# Patient Record
Sex: Male | Born: 1970
Health system: Southern US, Community
[De-identification: ages and names within clinical notes are randomized; demographics above are authoritative.]

## PROBLEM LIST (undated history)

## (undated) ENCOUNTER — Emergency Department: Payer: Medicare HMO | Source: Home / Self Care

## (undated) DIAGNOSIS — I10 Essential (primary) hypertension: Secondary | ICD-10-CM

## (undated) DIAGNOSIS — I5030 Unspecified diastolic (congestive) heart failure: Secondary | ICD-10-CM

## (undated) DIAGNOSIS — Z720 Tobacco use: Secondary | ICD-10-CM

## (undated) DIAGNOSIS — F418 Other specified anxiety disorders: Secondary | ICD-10-CM

## (undated) DIAGNOSIS — G8929 Other chronic pain: Secondary | ICD-10-CM

## (undated) DIAGNOSIS — F1410 Cocaine abuse, uncomplicated: Secondary | ICD-10-CM

## (undated) DIAGNOSIS — F1210 Cannabis abuse, uncomplicated: Secondary | ICD-10-CM

## (undated) DIAGNOSIS — I4901 Ventricular fibrillation: Secondary | ICD-10-CM

## (undated) DIAGNOSIS — E785 Hyperlipidemia, unspecified: Secondary | ICD-10-CM

## (undated) DIAGNOSIS — E66811 Obesity, class 1: Secondary | ICD-10-CM

## (undated) DIAGNOSIS — F1190 Opioid use, unspecified, uncomplicated: Secondary | ICD-10-CM

## (undated) DIAGNOSIS — K7030 Alcoholic cirrhosis of liver without ascites: Secondary | ICD-10-CM

## (undated) DIAGNOSIS — I2510 Atherosclerotic heart disease of native coronary artery without angina pectoris: Secondary | ICD-10-CM

## (undated) DIAGNOSIS — I219 Acute myocardial infarction, unspecified: Secondary | ICD-10-CM

## (undated) DIAGNOSIS — F1010 Alcohol abuse, uncomplicated: Secondary | ICD-10-CM

## (undated) DIAGNOSIS — J449 Chronic obstructive pulmonary disease, unspecified: Secondary | ICD-10-CM

## (undated) DIAGNOSIS — K219 Gastro-esophageal reflux disease without esophagitis: Secondary | ICD-10-CM

## (undated) DIAGNOSIS — E271 Primary adrenocortical insufficiency: Secondary | ICD-10-CM

## (undated) DIAGNOSIS — I255 Ischemic cardiomyopathy: Secondary | ICD-10-CM

## (undated) DIAGNOSIS — Z91199 Patient's noncompliance with other medical treatment and regimen due to unspecified reason: Secondary | ICD-10-CM

## (undated) DIAGNOSIS — K922 Gastrointestinal hemorrhage, unspecified: Secondary | ICD-10-CM

## (undated) DIAGNOSIS — K649 Unspecified hemorrhoids: Secondary | ICD-10-CM

## (undated) DIAGNOSIS — K766 Portal hypertension: Secondary | ICD-10-CM

## (undated) DIAGNOSIS — G43909 Migraine, unspecified, not intractable, without status migrainosus: Secondary | ICD-10-CM

## (undated) HISTORY — DX: Ischemic cardiomyopathy: I25.5

## (undated) HISTORY — PX: CORONARY ANGIOPLASTY WITH STENT PLACEMENT: SHX49

## (undated) HISTORY — DX: Atherosclerotic heart disease of native coronary artery without angina pectoris: I25.10

## (undated) HISTORY — DX: Tobacco use: Z72.0

## (undated) HISTORY — DX: Ventricular fibrillation: I49.01

## (undated) HISTORY — DX: Essential (primary) hypertension: I10

## (undated) HISTORY — DX: Other chronic pain: G89.29

## (undated) HISTORY — DX: Gastro-esophageal reflux disease without esophagitis: K21.9

## (undated) HISTORY — DX: Other specified anxiety disorders: F41.8

## (undated) HISTORY — DX: Hyperlipidemia, unspecified: E78.5

## (undated) HISTORY — DX: Migraine, unspecified, not intractable, without status migrainosus: G43.909

---

## 1999-01-19 ENCOUNTER — Encounter: Payer: Self-pay | Admitting: Emergency Medicine

## 1999-01-19 ENCOUNTER — Emergency Department (HOSPITAL_COMMUNITY): Admission: EM | Admit: 1999-01-19 | Discharge: 1999-01-19 | Payer: Self-pay | Admitting: Emergency Medicine

## 2001-06-21 ENCOUNTER — Inpatient Hospital Stay (HOSPITAL_COMMUNITY): Admission: EM | Admit: 2001-06-21 | Discharge: 2001-06-23 | Payer: Self-pay | Admitting: Emergency Medicine

## 2005-04-09 ENCOUNTER — Emergency Department: Payer: Self-pay | Admitting: Emergency Medicine

## 2005-04-24 ENCOUNTER — Emergency Department: Payer: Self-pay | Admitting: Emergency Medicine

## 2005-04-24 ENCOUNTER — Other Ambulatory Visit: Payer: Self-pay

## 2005-05-05 ENCOUNTER — Emergency Department: Payer: Self-pay | Admitting: Internal Medicine

## 2005-11-01 ENCOUNTER — Emergency Department: Payer: Self-pay | Admitting: Emergency Medicine

## 2005-12-02 ENCOUNTER — Other Ambulatory Visit: Payer: Self-pay

## 2005-12-02 ENCOUNTER — Emergency Department: Payer: Self-pay | Admitting: General Practice

## 2005-12-02 IMAGING — CR DG CHEST 2V
1 series · 2 of 2 positions shown · non-contrast
Comparison: none

REASON FOR EXAM: Chest pain and shortness of breath.  [HOSPITAL]
COMMENTS:

PROCEDURE:     DXR - DXR CHEST PA (OR AP) AND LATERAL  - [DATE]  [DATE]
RESULT:       The patient has no prior studies for comparison.  The lungs
are clear.  The heart and pulmonary vessels are normal.  The bony and
mediastinal structures are unremarkable.

[Series 2842: postero_anterior · 0.11mm/px · 2 of 2 slices shown]
[im 1/2]
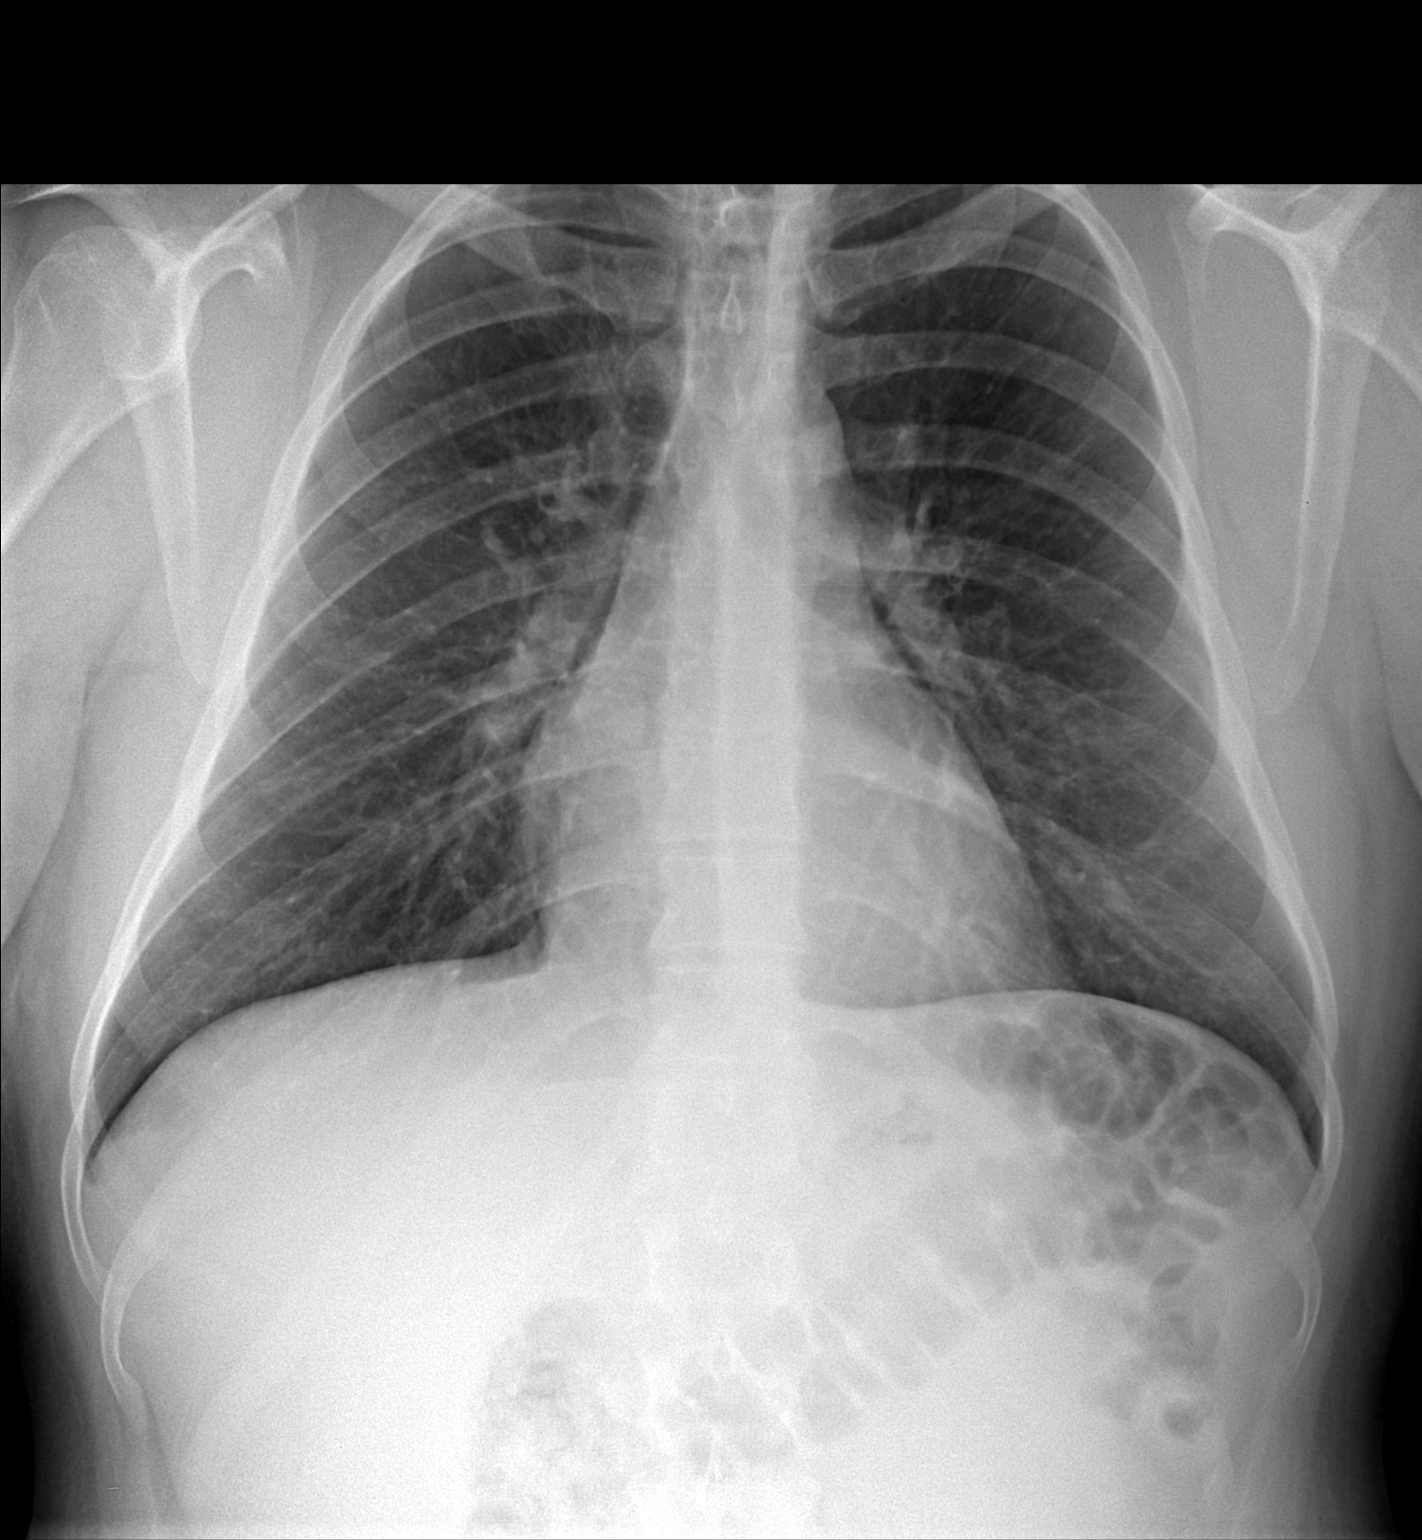
[im 2/2]
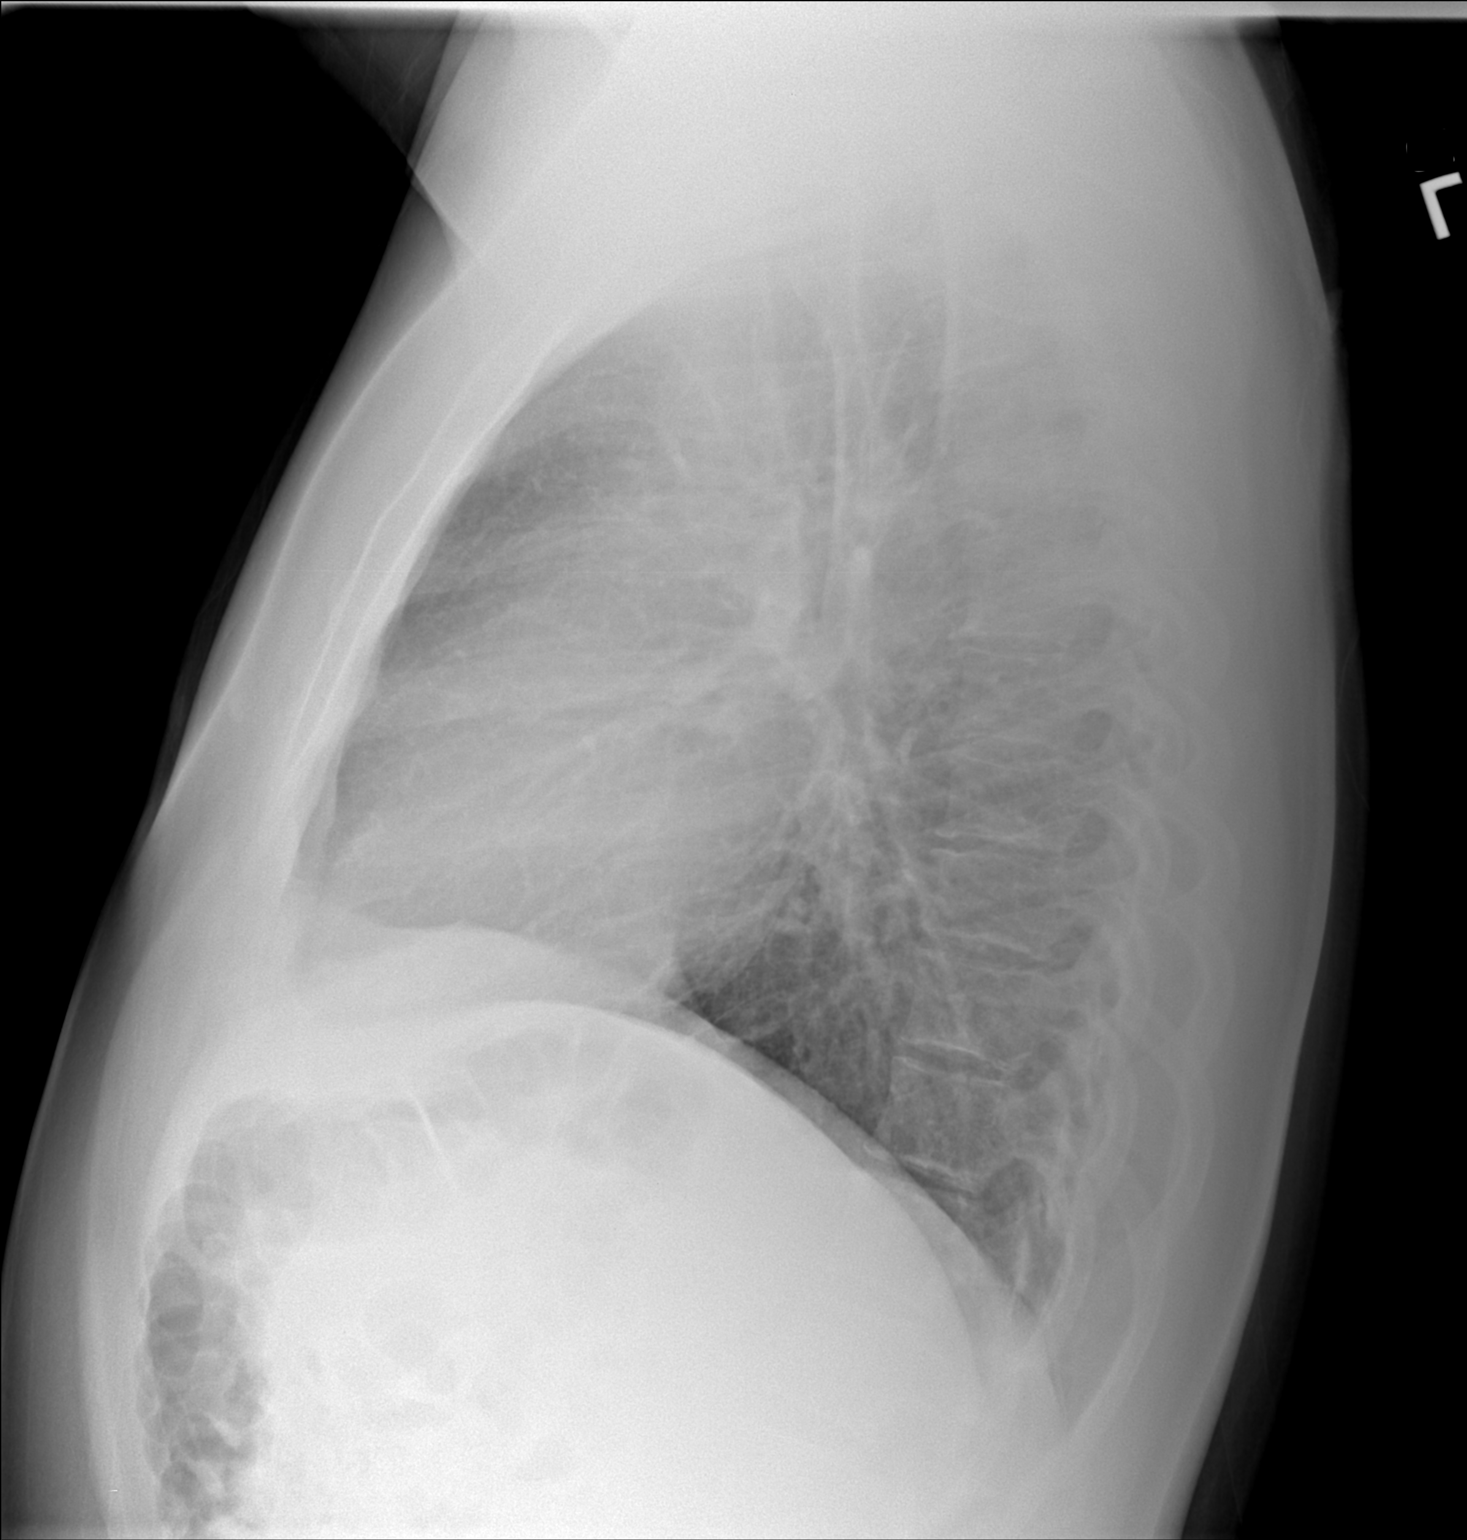

[2 of 2 positions shown; findings below may reference images not displayed]

IMPRESSION: No acute cardiopulmonary disease evident.

## 2006-08-26 ENCOUNTER — Emergency Department: Payer: Self-pay | Admitting: Emergency Medicine

## 2007-02-17 ENCOUNTER — Emergency Department: Payer: Self-pay | Admitting: Emergency Medicine

## 2007-06-22 ENCOUNTER — Emergency Department: Payer: Self-pay | Admitting: Internal Medicine

## 2007-08-18 ENCOUNTER — Emergency Department: Payer: Self-pay | Admitting: Emergency Medicine

## 2008-07-06 ENCOUNTER — Emergency Department: Payer: Self-pay | Admitting: Emergency Medicine

## 2008-08-23 ENCOUNTER — Emergency Department: Payer: Self-pay | Admitting: Emergency Medicine

## 2008-11-18 ENCOUNTER — Emergency Department: Payer: Self-pay | Admitting: Emergency Medicine

## 2009-11-10 ENCOUNTER — Emergency Department: Payer: Self-pay | Admitting: Emergency Medicine

## 2010-05-01 ENCOUNTER — Emergency Department: Payer: Self-pay | Admitting: Unknown Physician Specialty

## 2010-05-01 IMAGING — CR DG SHOULDER 3+V*R*
1 series · 3 of 3 positions shown · non-contrast
Comparison: none

REASON FOR EXAM: pain/decrease ROM for one week
COMMENTS:

PROCEDURE:     DXR - DXR SHOULDER RIGHT COMPLETE  - [DATE]  [DATE]
RESULT:     No fracture, dislocation or other acute bony abnormality is
identified.

[Series 1: view not recorded · 0.17mm/px · 3 of 3 slices shown]
[im 1/3]
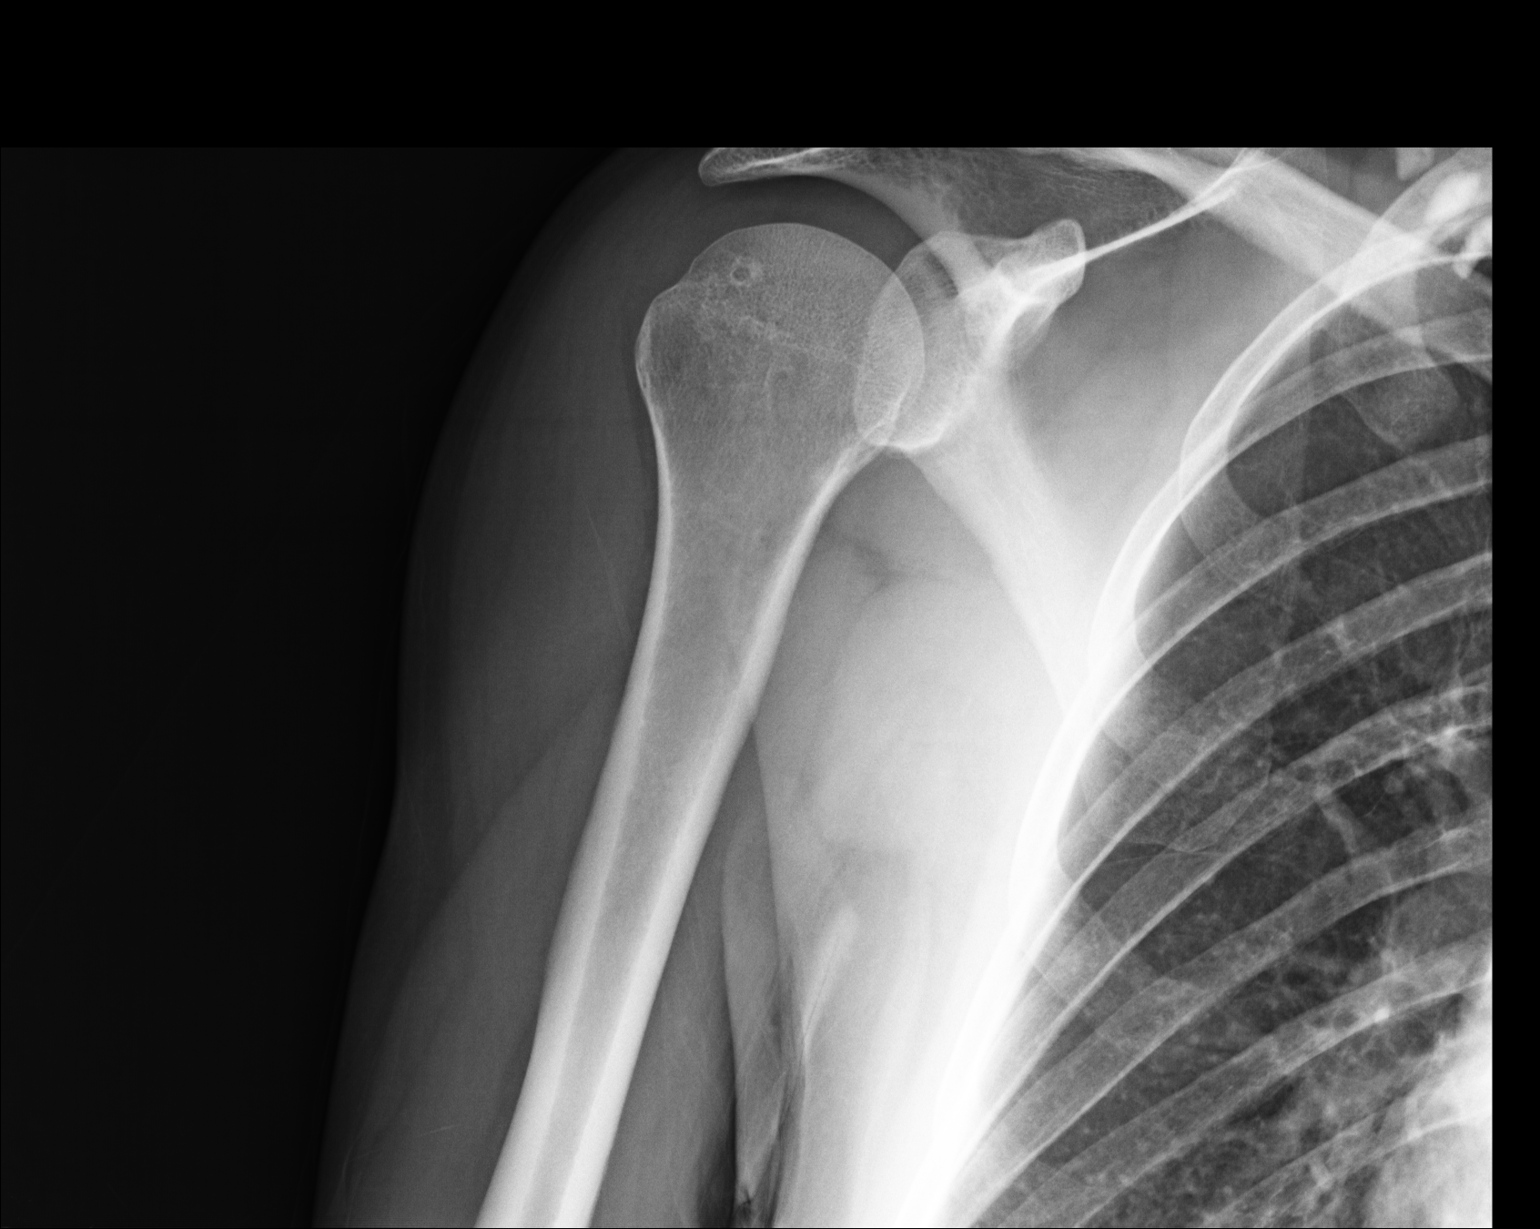
[im 2/3]
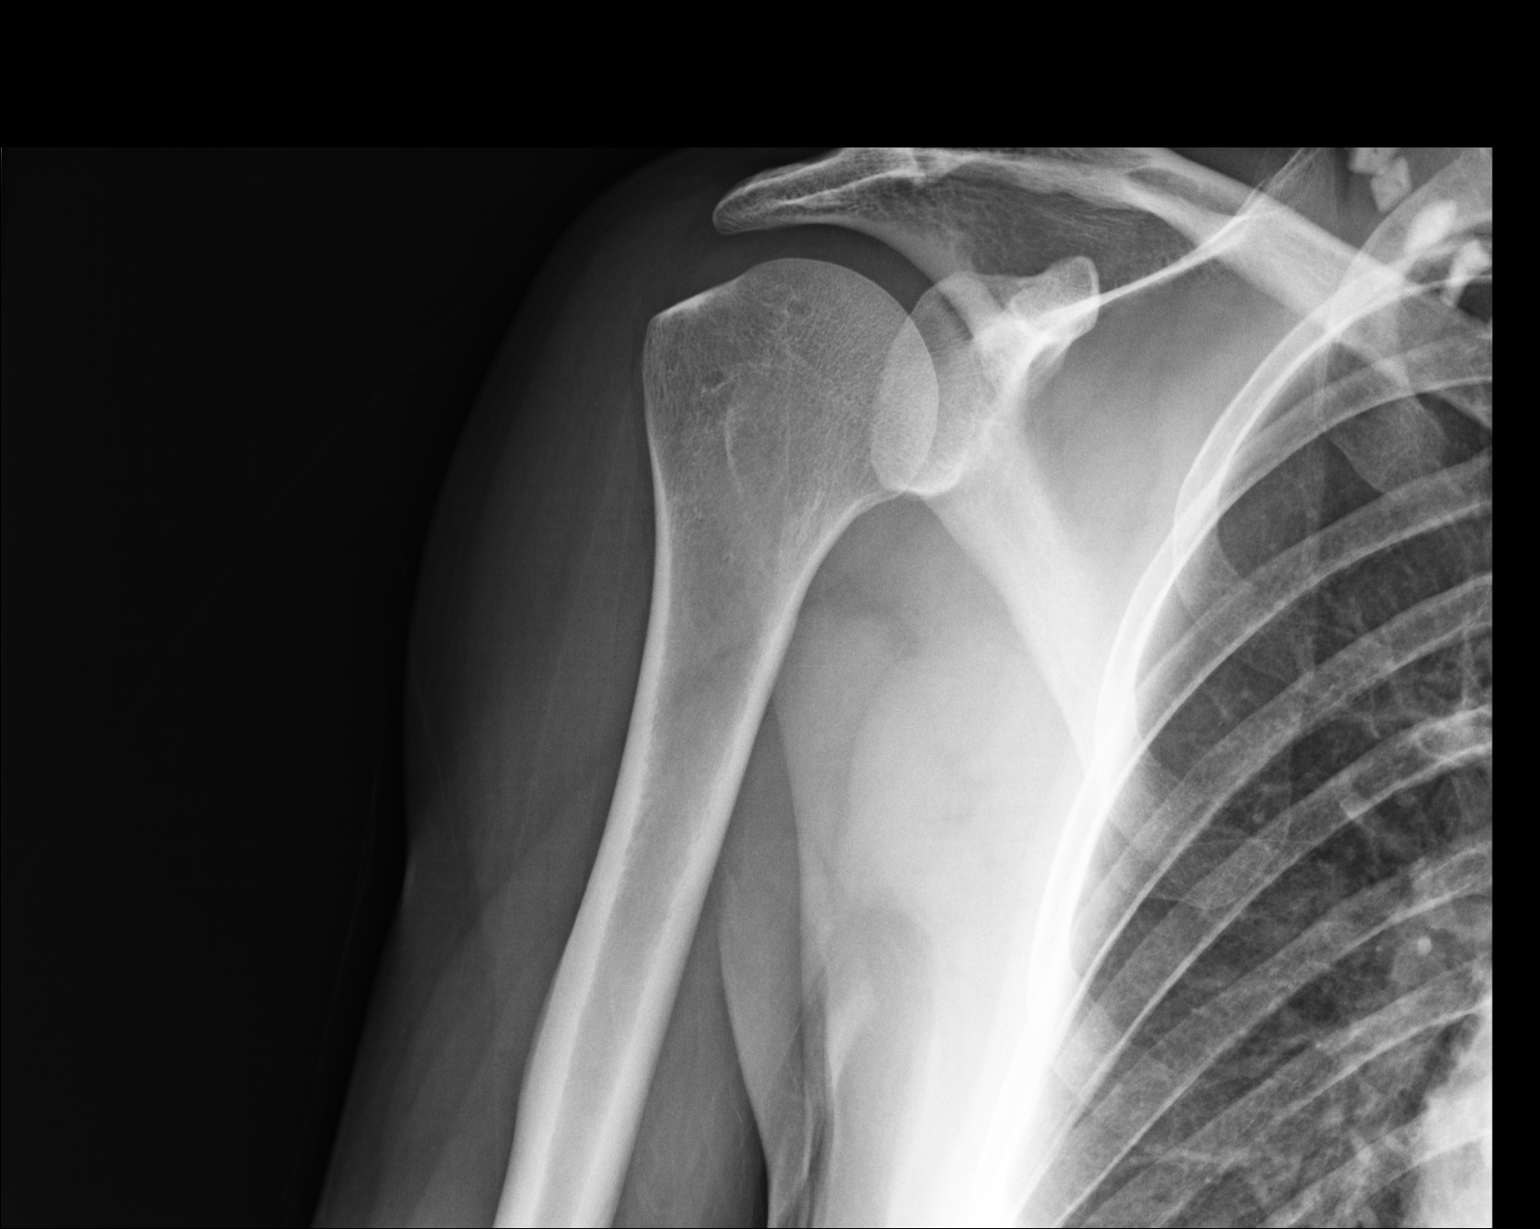
[im 3/3]
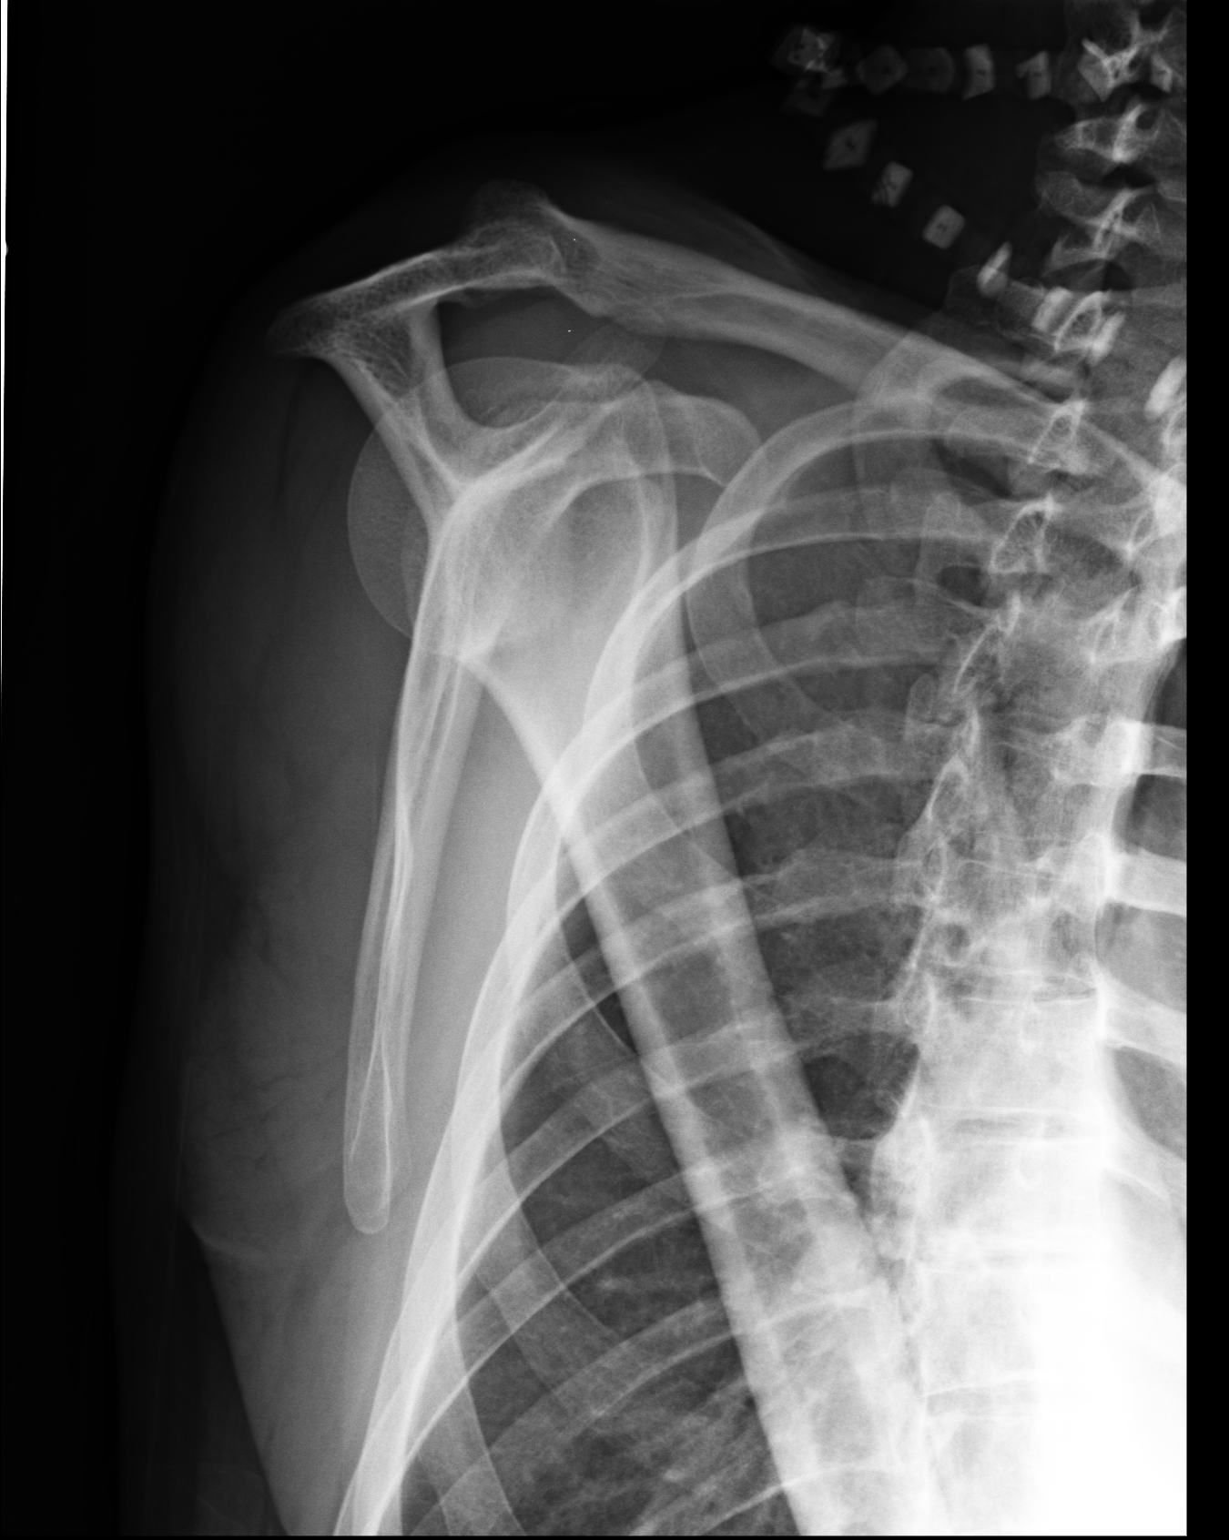

[3 of 3 positions shown; findings below may reference images not displayed]

IMPRESSION: 1. No significant osseous abnormalities are noted.
2. No soft tissue calcification about the humeral head is seen.

## 2010-08-13 ENCOUNTER — Emergency Department: Payer: Self-pay | Admitting: Emergency Medicine

## 2010-09-02 ENCOUNTER — Emergency Department: Payer: Self-pay | Admitting: Emergency Medicine

## 2010-09-02 IMAGING — CR DG HAND COMPLETE 3+V*L*
1 series · 3 of 3 positions shown · non-contrast
Comparison: none

REASON FOR EXAM: edema w/suspected FB
COMMENTS:   May transport without cardiac monitor

PROCEDURE:     DXR - DXR HAND LT COMPLETE  W/OBLIQUES  - [DATE]  [DATE]
RESULT:     No fracture, dislocation or other acute bony abnormality is
identified.

[Series 1: view not recorded · 0.17mm/px · 3 of 3 slices shown]
[im 1/3]
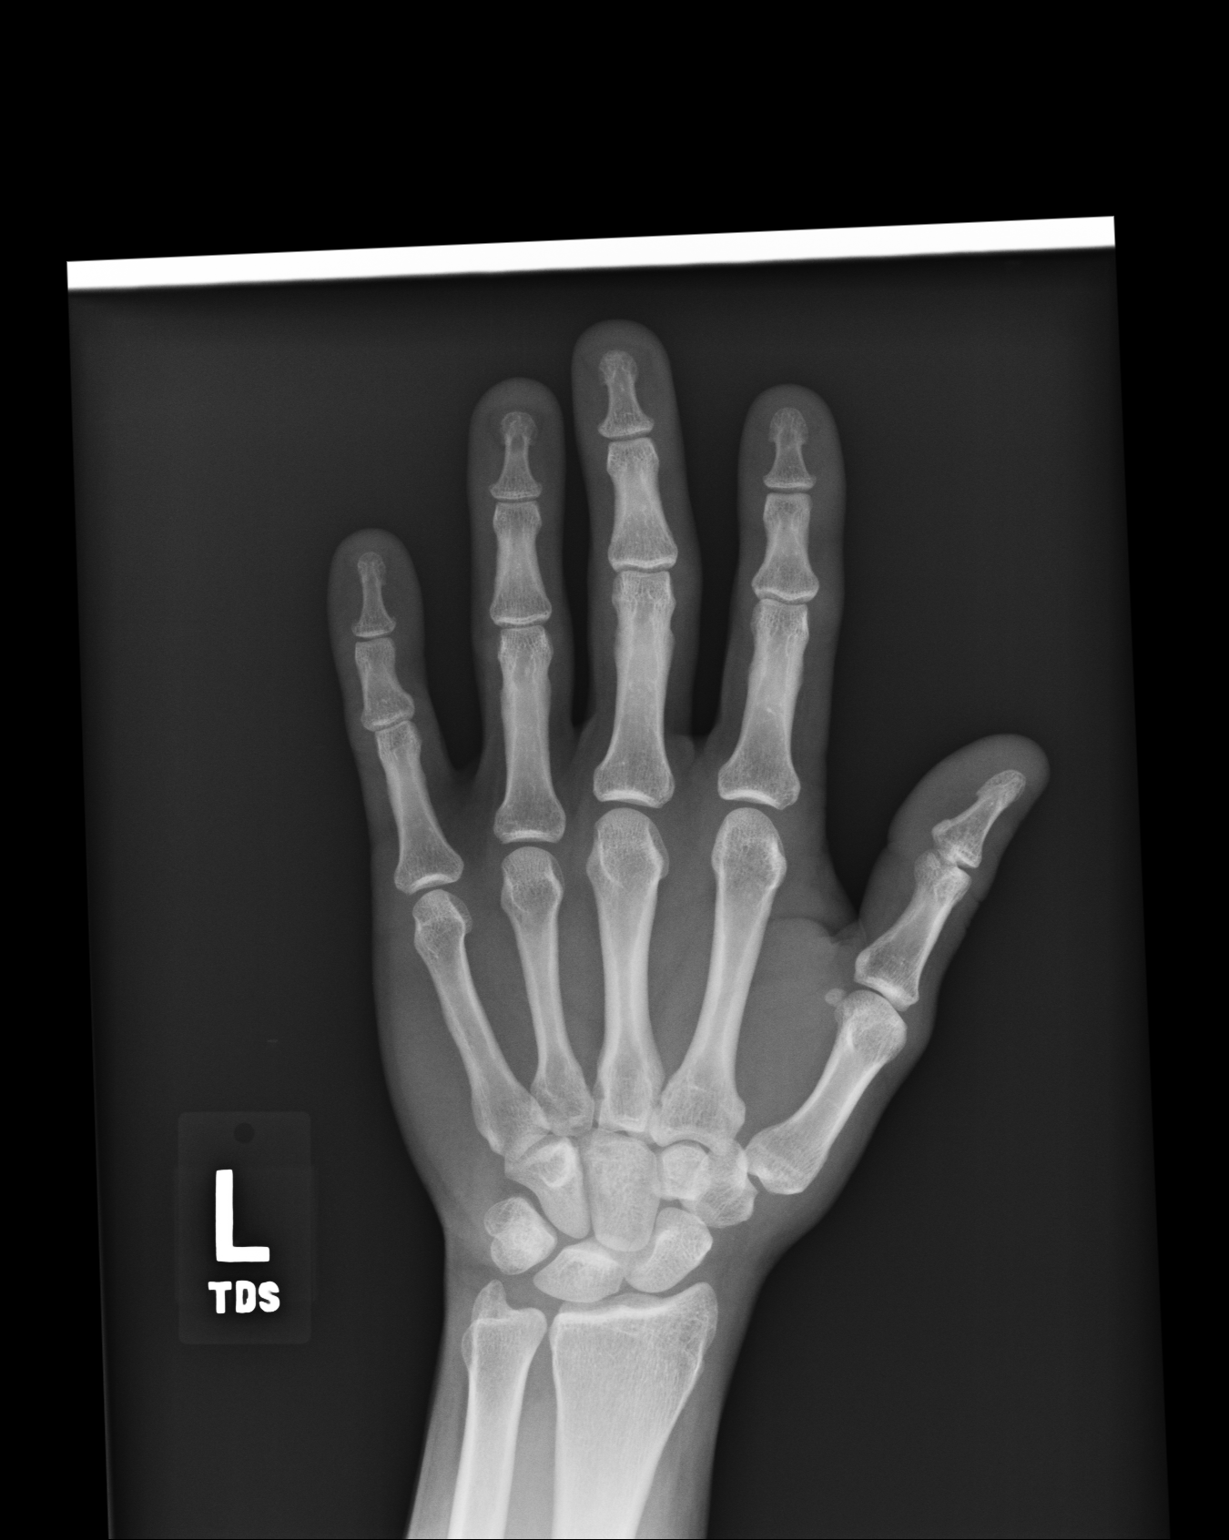
[im 2/3]
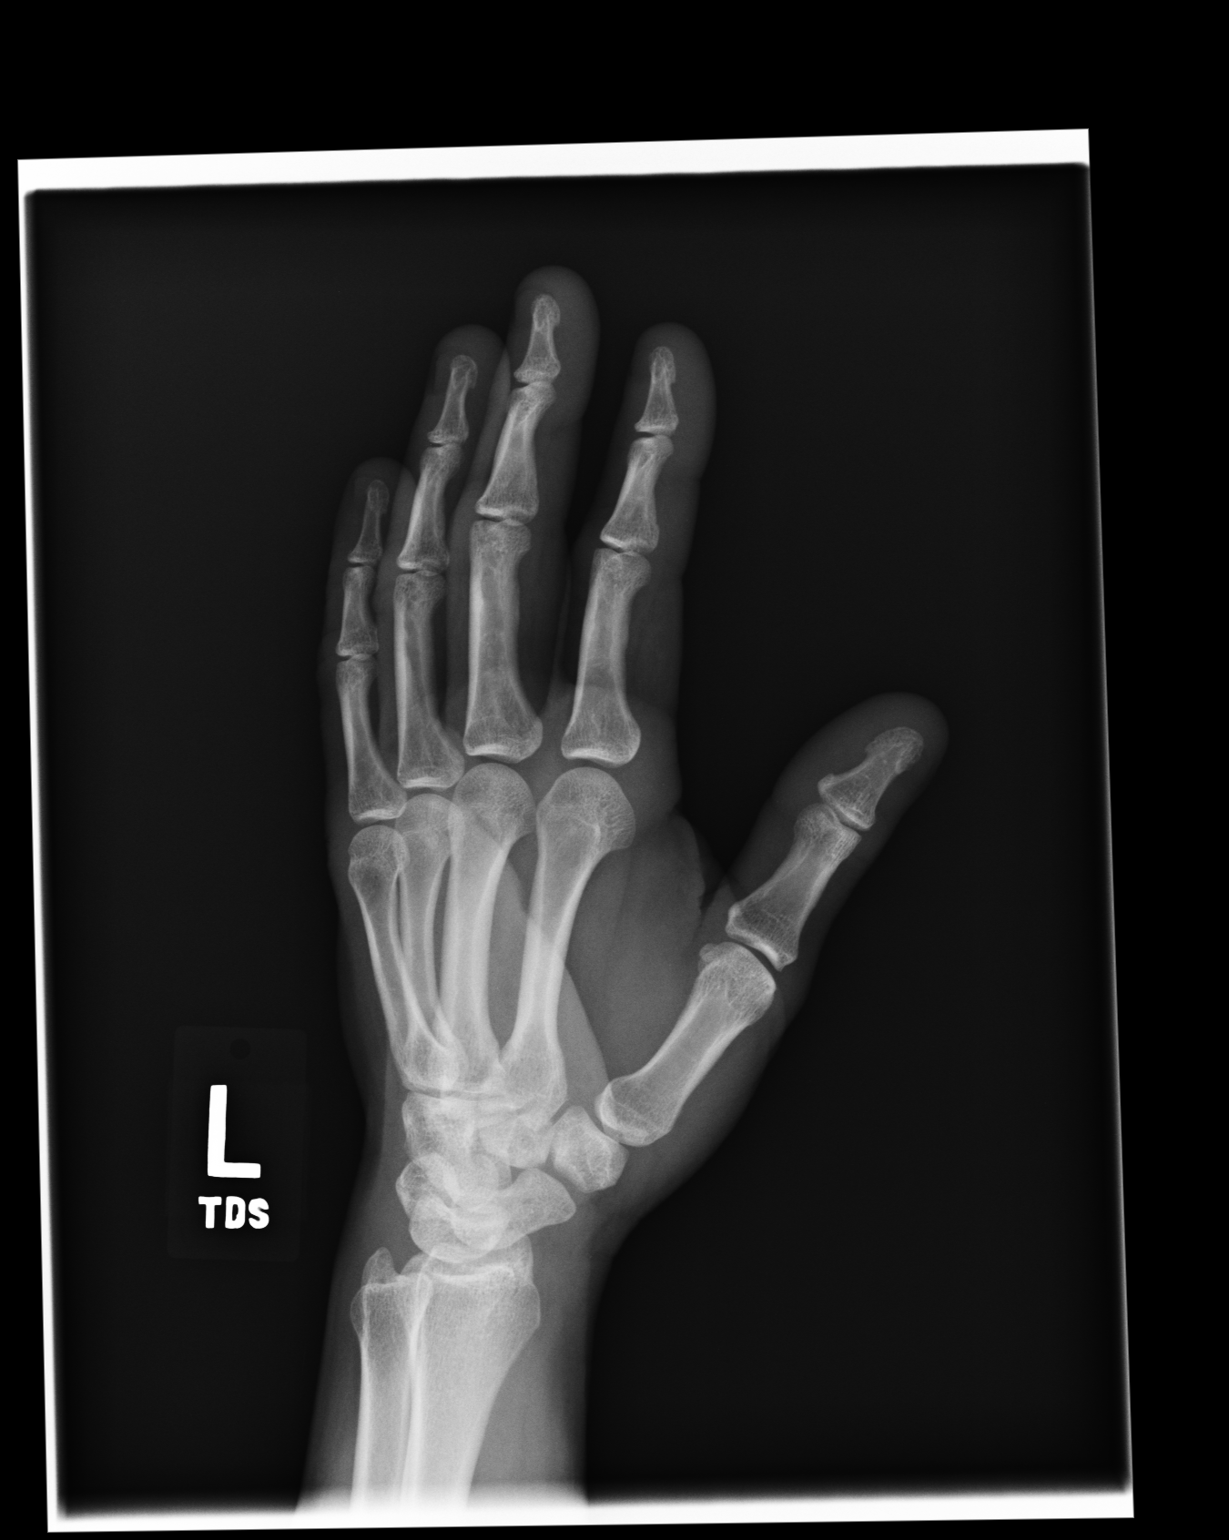
[im 3/3]
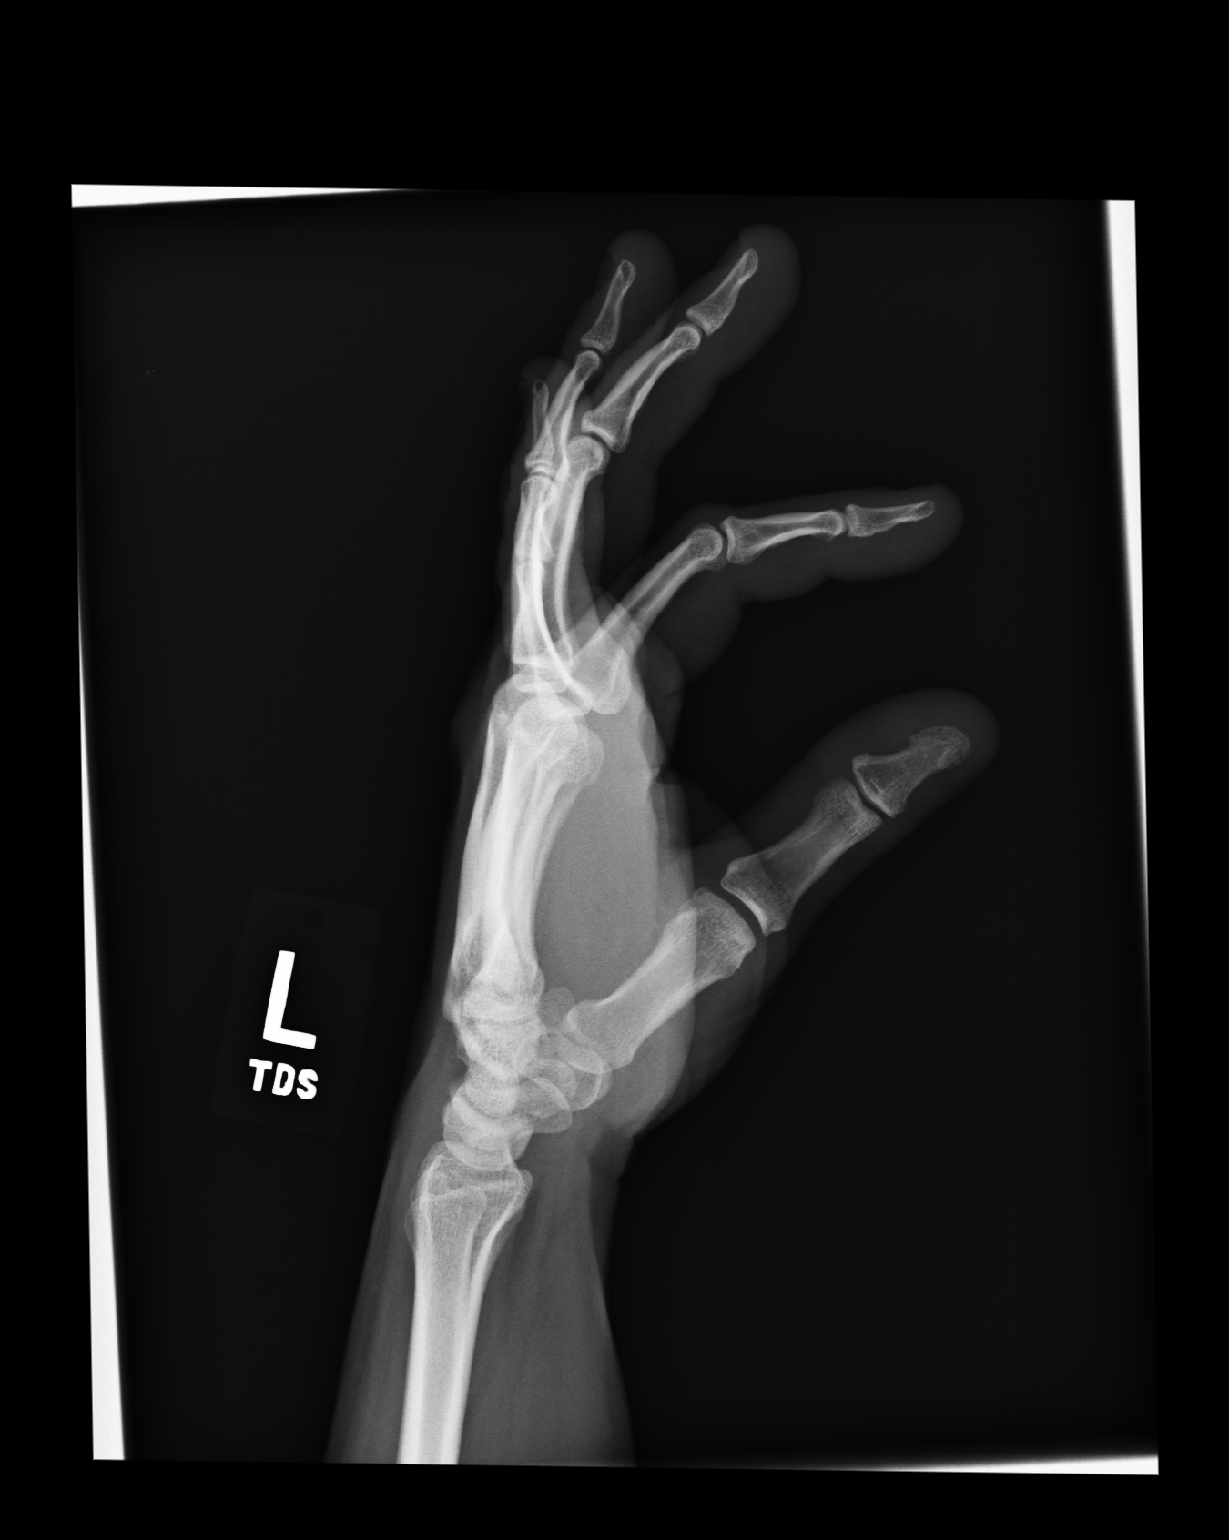

[3 of 3 positions shown; findings below may reference images not displayed]

IMPRESSION: No significant osseous abnormalities are noted.

## 2010-12-16 DIAGNOSIS — I213 ST elevation (STEMI) myocardial infarction of unspecified site: Secondary | ICD-10-CM

## 2010-12-16 HISTORY — DX: ST elevation (STEMI) myocardial infarction of unspecified site: I21.3

## 2011-01-10 ENCOUNTER — Emergency Department: Payer: Self-pay | Admitting: Internal Medicine

## 2011-01-10 IMAGING — CR DG HAND COMPLETE 3+V*L*
1 series · 3 of 3 positions shown · non-contrast
Comparison: none

REASON FOR EXAM: pain swelling f/b
COMMENTS:   LMP: (Male)

PROCEDURE:     DXR - DXR HAND LT COMPLETE  W/OBLIQUES  - [DATE]  [DATE]
RESULT:     No fracture, dislocation or other acute bony abnormality is
identified. No radiodense soft tissue foreign body is identified.

[Series 1: view not recorded · 0.17mm/px · 3 of 3 slices shown]
[im 1/3]
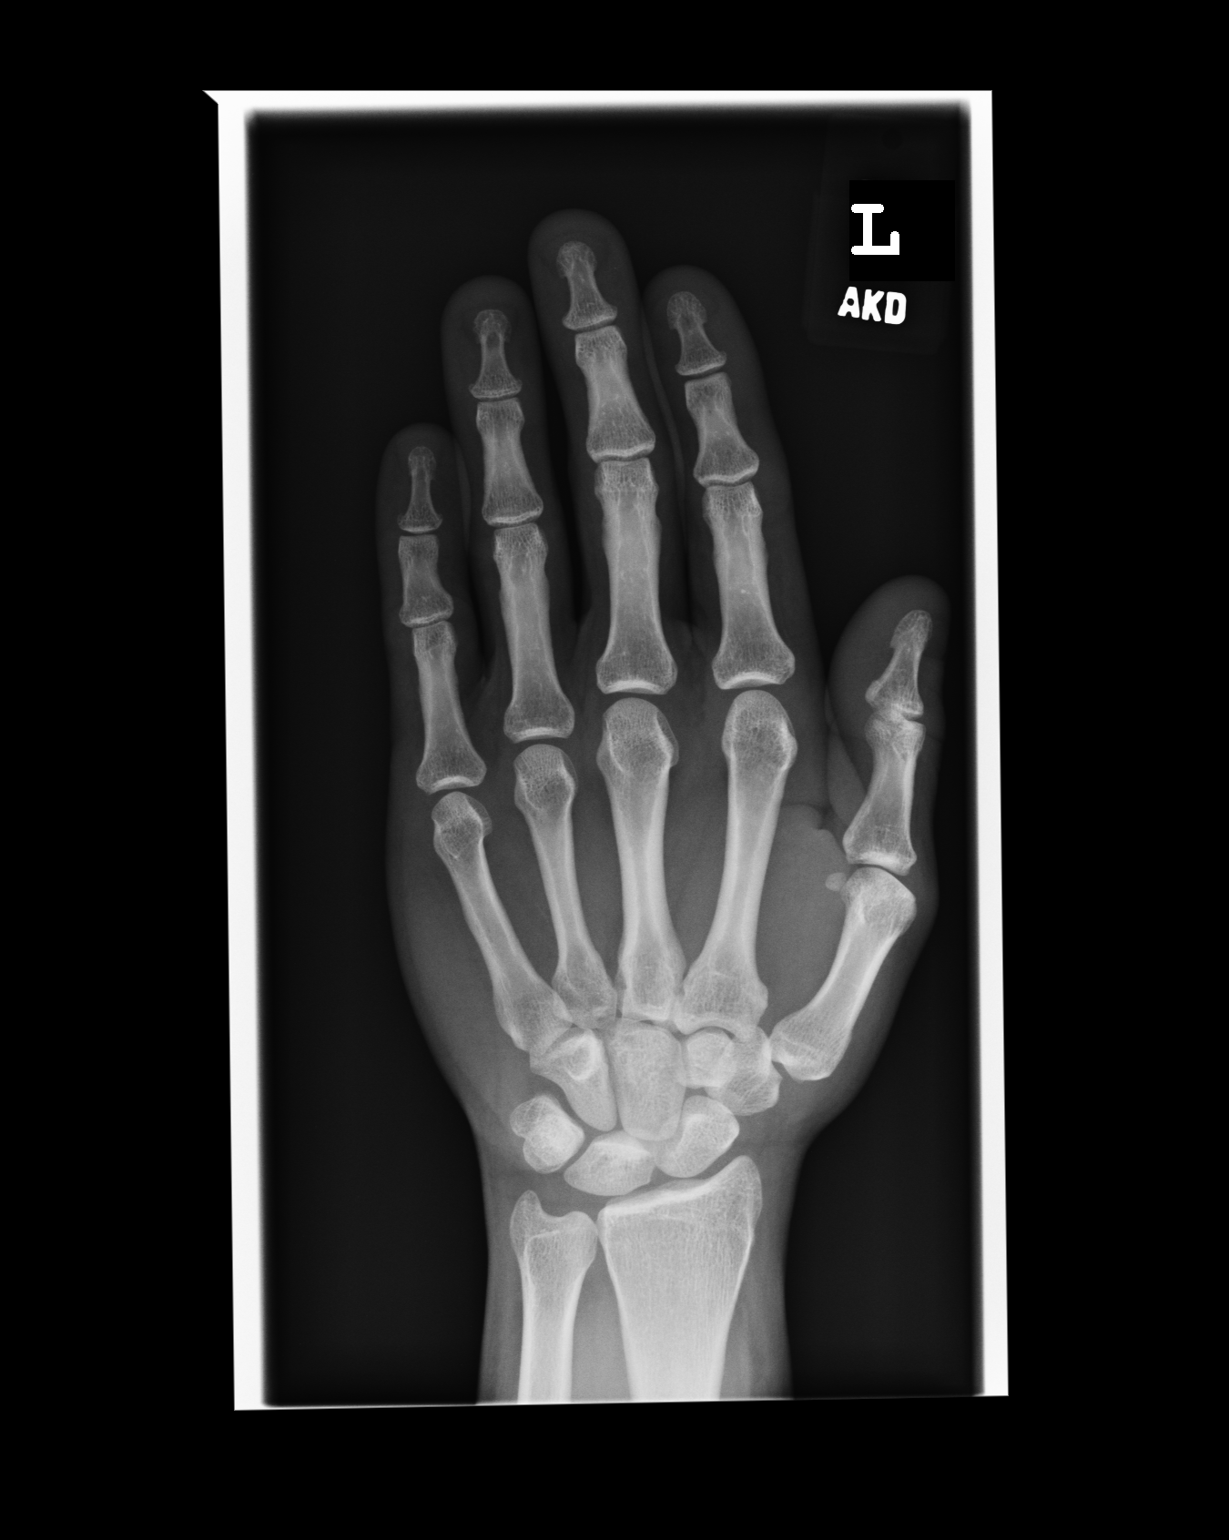
[im 2/3]
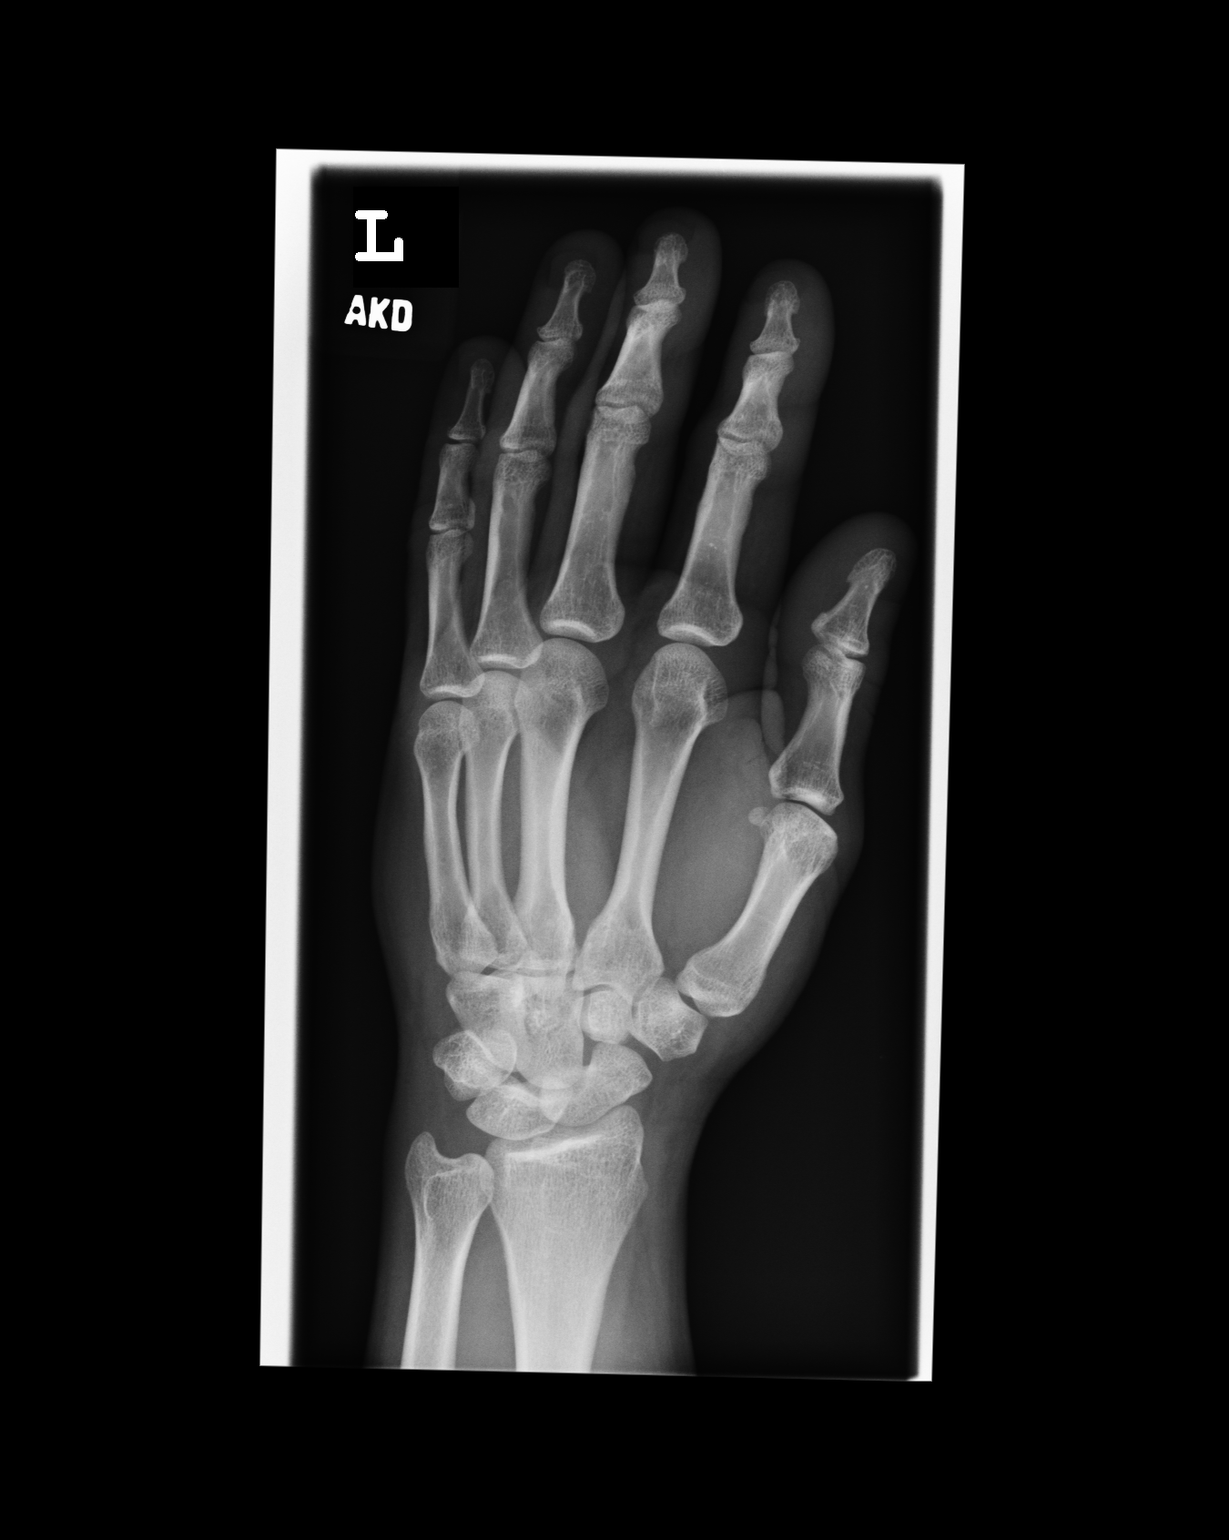
[im 3/3]
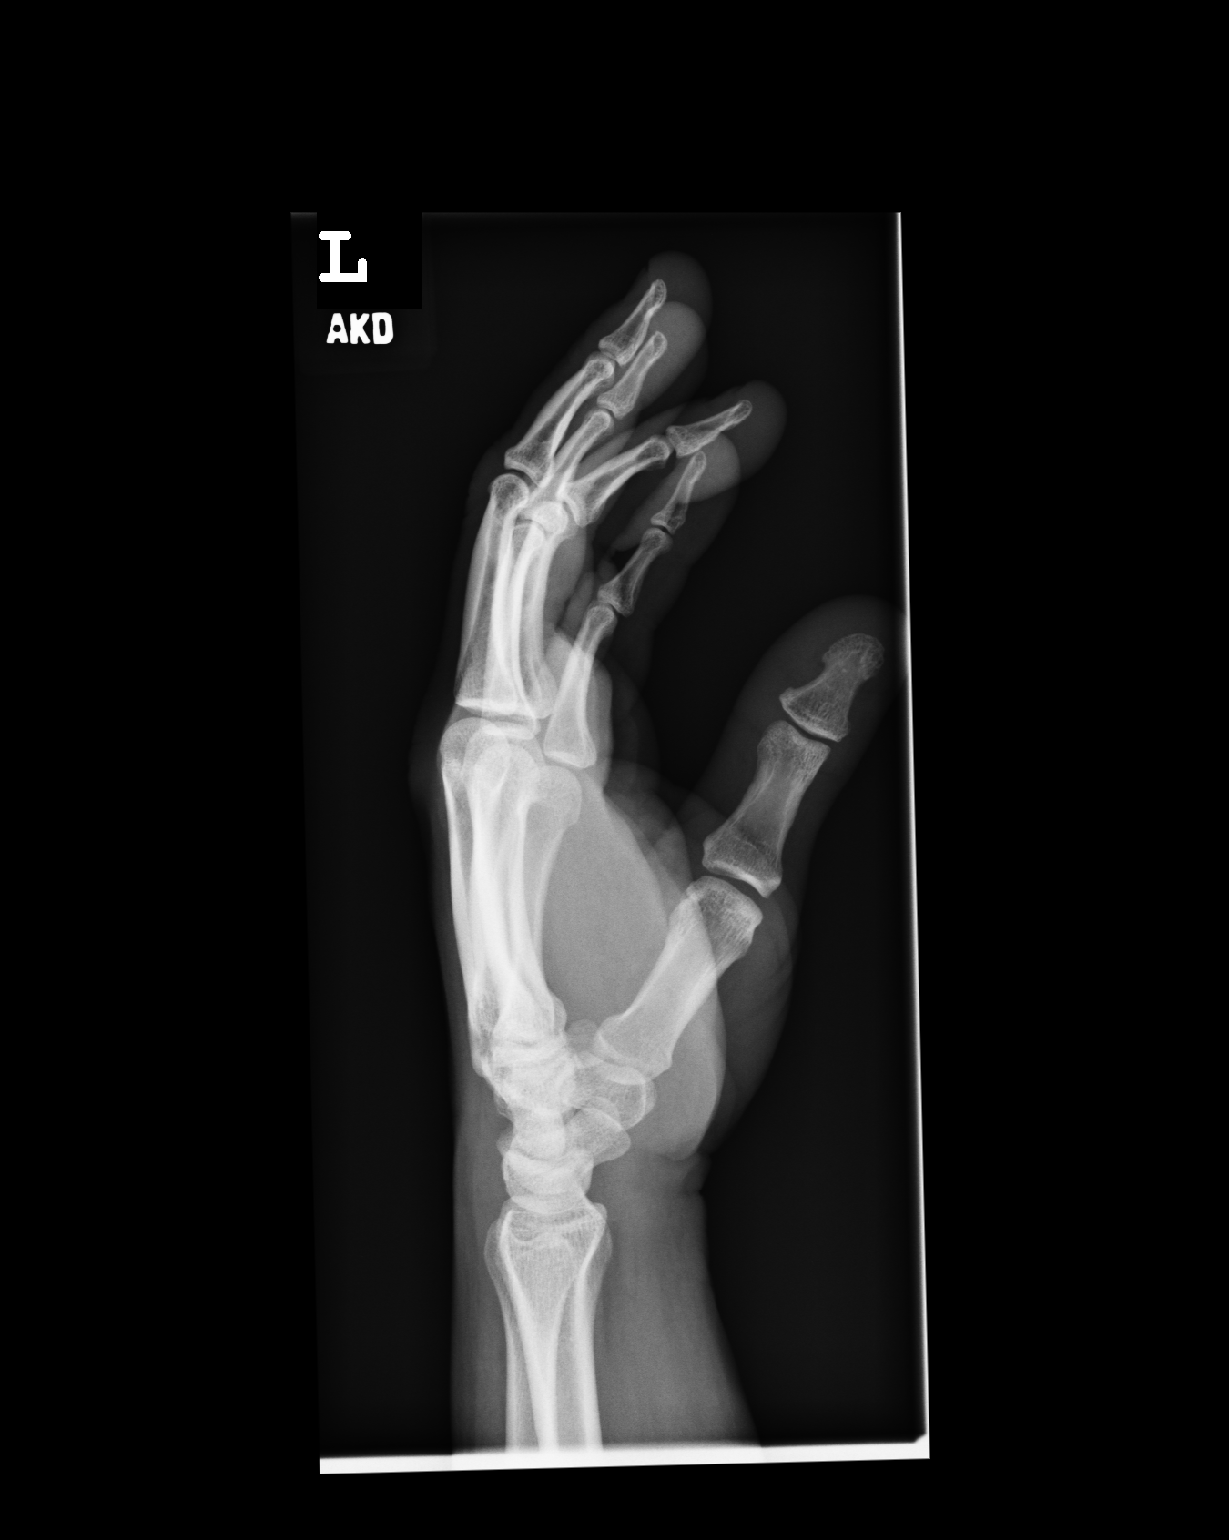

[3 of 3 positions shown; findings below may reference images not displayed]

IMPRESSION: No acute changes are identified.

## 2011-02-06 ENCOUNTER — Inpatient Hospital Stay (HOSPITAL_COMMUNITY)
Admission: EM | Admit: 2011-02-06 | Discharge: 2011-02-09 | DRG: 248 | Disposition: A | Discharge status: Home / Self Care | Payer: Self-pay | Attending: Cardiovascular Disease | Admitting: Cardiovascular Disease

## 2011-02-06 ENCOUNTER — Inpatient Hospital Stay: Admission: EM | Admit: 2011-02-06 | Payer: Self-pay | Admitting: Cardiovascular Disease

## 2011-02-06 DIAGNOSIS — I2109 ST elevation (STEMI) myocardial infarction involving other coronary artery of anterior wall: Secondary | ICD-10-CM

## 2011-02-06 DIAGNOSIS — F1410 Cocaine abuse, uncomplicated: Secondary | ICD-10-CM | POA: Diagnosis present

## 2011-02-06 DIAGNOSIS — F17200 Nicotine dependence, unspecified, uncomplicated: Secondary | ICD-10-CM | POA: Diagnosis present

## 2011-02-06 DIAGNOSIS — F1210 Cannabis abuse, uncomplicated: Secondary | ICD-10-CM | POA: Diagnosis present

## 2011-02-06 DIAGNOSIS — I2510 Atherosclerotic heart disease of native coronary artery without angina pectoris: Secondary | ICD-10-CM | POA: Diagnosis present

## 2011-02-06 DIAGNOSIS — F1010 Alcohol abuse, uncomplicated: Secondary | ICD-10-CM | POA: Diagnosis present

## 2011-02-06 DIAGNOSIS — I472 Ventricular tachycardia, unspecified: Secondary | ICD-10-CM | POA: Diagnosis present

## 2011-02-06 DIAGNOSIS — I4729 Other ventricular tachycardia: Secondary | ICD-10-CM | POA: Diagnosis present

## 2011-02-06 DIAGNOSIS — I4901 Ventricular fibrillation: Secondary | ICD-10-CM | POA: Diagnosis not present

## 2011-02-06 DIAGNOSIS — I2589 Other forms of chronic ischemic heart disease: Secondary | ICD-10-CM | POA: Diagnosis present

## 2011-02-06 DIAGNOSIS — E119 Type 2 diabetes mellitus without complications: Secondary | ICD-10-CM | POA: Diagnosis present

## 2011-02-07 DIAGNOSIS — I219 Acute myocardial infarction, unspecified: Secondary | ICD-10-CM

## 2011-02-07 LAB — CARDIAC PANEL(CRET KIN+CKTOT+MB+TROPI)
CK, MB: 252.8 ng/mL (ref 0.3–4.0)
CK, MB: 195.5 ng/mL (ref 0.3–4.0)
CK, MB: 100.8 ng/mL (ref 0.3–4.0)
Relative Index: 8.3 — ABNORMAL HIGH (ref 0.0–2.5)
Relative Index: 7.6 — ABNORMAL HIGH (ref 0.0–2.5)
Relative Index: 6.8 — ABNORMAL HIGH (ref 0.0–2.5)
Total CK: 3031 U/L — ABNORMAL HIGH (ref 7–232)
Total CK: 2563 U/L — ABNORMAL HIGH (ref 7–232)
Total CK: 1472 U/L — ABNORMAL HIGH (ref 7–232)
Troponin I: 68.2 ng/mL (ref 0.00–0.06)
Troponin I: 58.08 ng/mL (ref 0.00–0.06)
Troponin I: 38.29 ng/mL (ref 0.00–0.06)

## 2011-02-07 LAB — PROTIME-INR
INR: 1.24 (ref 0.00–1.49)
Prothrombin Time: 15.8 seconds — ABNORMAL HIGH (ref 11.6–15.2)

## 2011-02-07 LAB — COMPREHENSIVE METABOLIC PANEL
ALT: 38 U/L (ref 0–53)
AST: 206 U/L — ABNORMAL HIGH (ref 0–37)
Albumin: 3.7 g/dL (ref 3.5–5.2)
Alkaline Phosphatase: 64 U/L (ref 39–117)
BUN: 6 mg/dL (ref 6–23)
CO2: 28 mEq/L (ref 19–32)
Calcium: 9.1 mg/dL (ref 8.4–10.5)
Chloride: 104 mEq/L (ref 96–112)
Creatinine, Ser: 0.94 mg/dL (ref 0.4–1.5)
GFR calc Af Amer: >60 mL/min (ref >60)
GFR calc non Af Amer: >60 mL/min (ref >60)
Glucose, Bld: 108 mg/dL — ABNORMAL HIGH (ref 70–99)
Potassium: 4 mEq/L (ref 3.5–5.1)
Sodium: 139 mEq/L (ref 135–145)
Total Bilirubin: 0.5 mg/dL (ref 0.3–1.2)
Total Protein: 6.9 g/dL (ref 6.0–8.3)

## 2011-02-07 LAB — GLUCOSE, CAPILLARY
Glucose-Capillary: 101 mg/dL — ABNORMAL HIGH (ref 70–99)
Glucose-Capillary: 100 mg/dL — ABNORMAL HIGH (ref 70–99)
Glucose-Capillary: 101 mg/dL — ABNORMAL HIGH (ref 70–99)
Glucose-Capillary: 136 mg/dL — ABNORMAL HIGH (ref 70–99)

## 2011-02-07 LAB — LIPID PANEL
Cholesterol: 157 mg/dL (ref 0–200)
HDL: 47 mg/dL (ref >39)
LDL Cholesterol: 90 mg/dL (ref 0–99)
Total CHOL/HDL Ratio: 3.3 RATIO
Triglycerides: 99 mg/dL (ref <150)
VLDL: 20 mg/dL (ref 0–40)

## 2011-02-07 LAB — DRUGS OF ABUSE SCREEN W/O ALC, ROUTINE URINE
Amphetamine Screen, Ur: NEGATIVE
Barbiturate Quant, Ur: NEGATIVE
Benzodiazepines.: POSITIVE — AB
Cocaine Metabolites: POSITIVE — AB
Creatinine,U: 121.9 mg/dL
Marijuana Metabolite: POSITIVE — AB
Methadone: NEGATIVE
Opiate Screen, Urine: POSITIVE — AB
Phencyclidine (PCP): NEGATIVE
Propoxyphene: NEGATIVE

## 2011-02-07 LAB — BASIC METABOLIC PANEL
BUN: 5 mg/dL — ABNORMAL LOW (ref 6–23)
CO2: 23 meq/L (ref 19–32)
Calcium: 8.7 mg/dL (ref 8.4–10.5)
Chloride: 105 meq/L (ref 96–112)
Creatinine, Ser: 0.74 mg/dL (ref 0.4–1.5)
GFR calc Af Amer: >60 mL/min (ref >60)
GFR calc non Af Amer: >60 mL/min (ref >60)
Glucose, Bld: 126 mg/dL — ABNORMAL HIGH (ref 70–99)
Potassium: 3.7 meq/L (ref 3.5–5.1)
Sodium: 136 meq/L (ref 135–145)

## 2011-02-07 LAB — CBC
HCT: 38.9 % — ABNORMAL LOW (ref 39.0–52.0)
HCT: 42.1 % (ref 39.0–52.0)
Hemoglobin: 13.6 g/dL (ref 13.0–17.0)
Hemoglobin: 14.6 g/dL (ref 13.0–17.0)
MCH: 33.2 pg (ref 26.0–34.0)
MCH: 33.5 pg (ref 26.0–34.0)
MCHC: 34.7 g/dL (ref 30.0–36.0)
MCHC: 35 g/dL (ref 30.0–36.0)
MCV: 95.7 fL (ref 78.0–100.0)
MCV: 95.8 fL (ref 78.0–100.0)
Platelets: 227 10*3/uL (ref 150–400)
Platelets: 228 10*3/uL (ref 150–400)
RBC: 4.06 MIL/uL — ABNORMAL LOW (ref 4.22–5.81)
RBC: 4.4 MIL/uL (ref 4.22–5.81)
RDW: 12.6 % (ref 11.5–15.5)
RDW: 12.7 % (ref 11.5–15.5)
WBC: 11.9 10*3/uL — ABNORMAL HIGH (ref 4.0–10.5)
WBC: 13.4 10*3/uL — ABNORMAL HIGH (ref 4.0–10.5)

## 2011-02-07 LAB — TSH: TSH: 0.549 u[IU]/mL (ref 0.350–4.500)

## 2011-02-07 LAB — MAGNESIUM: Magnesium: 2.3 mg/dL (ref 1.5–2.5)

## 2011-02-07 LAB — PLATELET INHIBITION P2Y12
P2Y12 % Inhibition: 28 %
Platelet Function  P2Y12: 156 [PRU] — ABNORMAL LOW (ref 194–418)
Platelet Function Baseline: 217 [PRU] (ref 194–418)

## 2011-02-07 LAB — APTT: aPTT: 74 seconds — ABNORMAL HIGH (ref 24–37)

## 2011-02-07 LAB — HEMOGLOBIN A1C
Hgb A1c MFr Bld: 5.3 % (ref <5.7)
Mean Plasma Glucose: 105 mg/dL (ref <117)

## 2011-02-07 LAB — POCT ACTIVATED CLOTTING TIME: Activated Clotting Time: 694 s

## 2011-02-07 LAB — MRSA PCR SCREENING: MRSA by PCR: NEGATIVE

## 2011-02-07 NOTE — Procedures (Signed)
NAME:  Ethan Wells, Ethan Wells NO.:  [id number]  MEDICAL RECORD NO.:  [id number]           PATIENT TYPE:  I  LOCATION:  2903                         FACILITY:  [facility]  PHYSICIAN:  Verne Carrow, MDDATE OF BIRTH:  09-23-1971  DATE OF PROCEDURE:  02/06/2011 DATE OF DISCHARGE:                           CARDIAC CATHETERIZATION   PROCEDURES PERFORMED: 1. Emergent left heart catheterization. 2. Selective coronary angiography. 3. Left ventricular angiogram. 4. Percutaneous transluminal coronary angioplasty with placement of a     bare-metal stent in the proximal left anterior descending artery. 5. Placement of an Angio-Seal femoral artery closure device in the     right femoral artery.  OPERATOR:  Verne Carrow, MD  INDICATION:  Anterior ST elevation myocardial infarction.  DETAILS OF PROCEDURE:  The patient was brought via EMS to the Emergency Department at [facility] after a Code STEMI was activated.  The patient apparently was in a bar earlier tonight when he had the onset of chest pain radiating to both arms and both shoulders.  The patient had ventricular tachycardia and EMS, but spontaneously converted back to sinus rhythm.  With the Cath Lab team was in place, the patient was brought to the Cath Lab.  The patient arrived in the emergency department at 9:45 p.m.  He was brought to the Cath Lab at 9:58 p.m.  As soon as he rolled into the cath lab, he had ventricular fibrillation requiring one shock.  Access was obtained at 10:04 p.m.  Access was through the right femoral artery.  The emergency consent was obtained. Initially, we performed selective angiography of the native right coronary artery.  We then selectively engaged the left main artery with a 6-French XB LAD 3.5 guiding catheter.  The patient had received 600 mg of Plavix in the emergency department as well as 5000 units of intravenous heparin.  Once we found the culprit lesion be the  left anterior descending artery, we started the patient on a drip of Angiomax after bolus was given.  A Cougar wire was passed down the LAD into the distal vessel.  When the ACT was greater than 200, 2.5 x 12-mm balloon was inflated twice in the proximal LAD.  Flow was reestablished into the vessel after balloon inflation.  At this point, the patient had complete resolution of his chest pain.  A 3.5 x 50-mm Vision bare-metal stent was carefully positioned in the midvessel and was deployed without difficulty.  A 3.5 x 12-mm noncompliant balloon was inflated to high pressures x1 inside the stent.  The stenosis was taken from 100% to 0%. At this point, a pigtail catheter was passed into the left ventricle and a hand injection was performed for a left ventricular angiogram.  We then performed an angiogram of the right femoral artery.  An Angio-Seal femoral artery closure device was placed in the right femoral artery. The patient tolerated the procedure well.  There were no immediate complications.  Once again, the patient had a ventricular fibrillation arrest in the beginning of the procedure, but had no arrhythmias during the procedure.  Total balloon time was 29 minutes.  The patient was taken to  the CCU in stable condition.  HEMODYNAMIC FINDINGS:  Central aortic pressure 123/90.  Left ventricular pressure 121/12.  Left ventricular end-diastolic pressure 16.  ANGIOGRAPHIC FINDINGS: 1. The left main coronary artery had no obstructive disease. 2. The left anterior descending had plaque in the proximal segment and     was totally occluded in the distal portion of the proximal segment. 3. The circumflex artery had 20% mid plaque. 4. The ramus intermediate had a 50% tubular stenosis. 5. The right coronary artery was a large dominant vessel with mild     plaque disease in the mid vessel. 6. Left ventricular angiogram showed ejection fraction of 40% with     hypokinesis of the apex and  inferoapical wall.  IMPRESSION: 1. Acute anterior ST-segment elevation myocardial infarction secondary     to occluded proximal left anterior descending artery. 2. Moderate disease in the ramus intermediate vessel. 3. Successful percutaneous transluminal coronary angioplasty with     placement of a bare-metal stent in the proximal left anterior     descending artery. 4. Segmental left ventricular systolic dysfunction.  RECOMMENDATIONS:  The patient will be continued on aspirin, Effient and will be started on Lopressor as well as Crestor.  He will be monitor in the CCU.  We will continue his Angiomax drip at a reduced rate for 2 hours.  We will check a P2Y12 test in the morning and if platelet inhibition is not effective, we will change the patient to Effient.     Verne Carrow, MD     CM/MEDQ  D:  02/06/2011  T:  02/07/2011  Job:  [id number]  Electronically Signed by Verne Carrow MD on 02/07/2011 02:16:40 PM

## 2011-02-08 LAB — CBC
HCT: 39.1 % (ref 39.0–52.0)
Hemoglobin: 13.6 g/dL (ref 13.0–17.0)
MCH: 33.3 pg (ref 26.0–34.0)
MCHC: 34.8 g/dL (ref 30.0–36.0)
MCV: 95.8 fL (ref 78.0–100.0)
Platelets: 212 10*3/uL (ref 150–400)
RBC: 4.08 MIL/uL — ABNORMAL LOW (ref 4.22–5.81)
RDW: 12.7 % (ref 11.5–15.5)
WBC: 12.1 10*3/uL — ABNORMAL HIGH (ref 4.0–10.5)

## 2011-02-08 LAB — BASIC METABOLIC PANEL
BUN: 3 mg/dL — ABNORMAL LOW (ref 6–23)
CO2: 23 mEq/L (ref 19–32)
Calcium: 8.8 mg/dL (ref 8.4–10.5)
Chloride: 108 mEq/L (ref 96–112)
Creatinine, Ser: 0.72 mg/dL (ref 0.4–1.5)
GFR calc Af Amer: >60 mL/min (ref >60)
GFR calc non Af Amer: >60 mL/min (ref >60)
Glucose, Bld: 114 mg/dL — ABNORMAL HIGH (ref 70–99)
Potassium: 3.9 mEq/L (ref 3.5–5.1)
Sodium: 137 meq/L (ref 135–145)

## 2011-02-08 LAB — GLUCOSE, CAPILLARY
Glucose-Capillary: 92 mg/dL (ref 70–99)
Glucose-Capillary: 110 mg/dL — ABNORMAL HIGH (ref 70–99)
Glucose-Capillary: 92 mg/dL (ref 70–99)
Glucose-Capillary: 113 mg/dL — ABNORMAL HIGH (ref 70–99)

## 2011-02-09 DIAGNOSIS — I2109 ST elevation (STEMI) myocardial infarction involving other coronary artery of anterior wall: Secondary | ICD-10-CM

## 2011-02-09 LAB — BASIC METABOLIC PANEL
BUN: 7 mg/dL (ref 6–23)
CO2: 25 meq/L (ref 19–32)
Calcium: 9.4 mg/dL (ref 8.4–10.5)
Chloride: 107 meq/L (ref 96–112)
Creatinine, Ser: 0.83 mg/dL (ref 0.4–1.5)
GFR calc Af Amer: >60 mL/min (ref >60)
GFR calc non Af Amer: >60 mL/min (ref >60)
Glucose, Bld: 130 mg/dL — ABNORMAL HIGH (ref 70–99)
Potassium: 3.6 meq/L (ref 3.5–5.1)
Sodium: 139 meq/L (ref 135–145)

## 2011-02-09 LAB — GLUCOSE, CAPILLARY: Glucose-Capillary: 91 mg/dL (ref 70–99)

## 2011-02-11 LAB — GLUCOSE, CAPILLARY: Glucose-Capillary: 104 mg/dL — ABNORMAL HIGH (ref 70–99)

## 2011-02-13 LAB — OPIATE, QUANTITATIVE, URINE
Codeine Urine: NEGATIVE ng/mL
Hydrocodone: 1328 ng/mL — ABNORMAL HIGH
Hydromorphone GC/MS Conf: NEGATIVE NG/ML
Morphine, Confirm: NEGATIVE ng/mL
Oxycodone, ur: NEGATIVE ng/mL
Oxymorphone: NEGATIVE NG/ML

## 2011-02-13 LAB — THC (MARIJUANA), URINE, CONFIRMATION: Marijuana, Ur-Confirmation: 529 ng/mL — ABNORMAL HIGH

## 2011-02-13 LAB — BENZODIAZEPINE, QUANTITATIVE, URINE
Alprazolam (GC/LC/MS), ur confirm: NEGATIVE ng/mL
Flurazepam GC/MS Conf: NEGATIVE NG/ML
Lorazepam UR QT: NEGATIVE NG/ML
Nordiazepam GC/MS Conf: NEGATIVE ng/mL
Oxazepam GC/MS Conf: NEGATIVE ng/mL
Temazepam GC/MS Conf: NEGATIVE NG/ML

## 2011-02-13 LAB — COCAINE, URINE, CONFIRMATION: Benzoylecgonine GC/MS Conf: 2844 NG/ML — ABNORMAL HIGH

## 2011-02-22 NOTE — Discharge Summary (Signed)
NAME:  Ethan Wells, Ethan Wells NO.:  [id number]  MEDICAL RECORD NO.:  [id number]           PATIENT TYPE:  I  LOCATION:  2925                         FACILITY:  [facility]  PHYSICIAN:  Jonelle Sidle, MD DATE OF BIRTH:  01-24-1971  DATE OF ADMISSION:  02/06/2011 DATE OF DISCHARGE:  02/09/2011                              DISCHARGE SUMMARY   PRIMARY CARDIOLOGIST:  Verne Carrow, MD  PRIMARY CARE PROVIDER:  None.  DISCHARGE DIAGNOSIS:  Acute anterior ST-segment elevation myocardial infarction.  SECONDARY DIAGNOSES: 1. Ongoing tobacco abuse. 2. Ongoing cocaine and marijuana abuse. 3. Ischemic cardiomyopathy with an EF of 25-30% with apical akinesis     and severe anteroseptal hypokinesis by echo on this admission.  ALLERGIES:  PENICILLIN.  PROCEDURES: 1. Emergent cardiac catheterization revealing a total occlusion of the     mid LAD with otherwise nonobstructive disease and EF of 40%.  There     was hypokinesis in the apex and inferoapex.  The LAD was     successfully stented with a 3.5 x 15-mm Multi-Link Vision bare     metal stent. 2. 2-D echocardiogram performed on February 07, 2011 showing an EF of     25-30% with severe hypokinesis of the anteroseptal wall and     akinesis of the apex.  No definite clot was seen.  HISTORY OF PRESENT ILLNESS:  A 40 year old male with the above-problem list, who was in his usual state of health until approximately 3 days prior to admission, when he began to experience intermittent chest pain. He had apparently used cocaine approximately 1 week prior to admission. On the day of admission, the pain became more constant in nature and was 10/10, with radiation to bilateral arms.  EMS was called, and the patient was noted to have anterior ST-segment elevation.  He was taken to the [facility] ED.  En route to the ED, he was noted to have nonsustained ventricular tachycardia and was treated with amiodarone intravenously.  Upon  arrival to [facility], the patient was taken to the cardiac cath lab for emergent catheterization.  HOSPITAL COURSE:  The patient underwent diagnostic catheterization revealing a total occlusion of the mid LAD with otherwise nonobstructive CAD.  EF was 40% with inferoapical and apical hypokinesis.  The LAD was felt to be the culprit vessel, and this was successfully stented with placement of 3.5 x 15 mm Vision bare metal stent.  The patient tolerated this procedure well and postprocedure, he was monitored in the coronary intensive care unit, where he eventually peaked his CK at 3031, MB at 252.8, and troponin I at 68.20.  He was maintained on aspirin and Effient therapy as well as low-dose beta-blocker.  We are unable to initiate ACE inhibitor therapy, however, secondary to ongoing hypotension.  The patient was counseled on the importance of smoking cessation as well as drug cessation.  The patient underwent follow-up 2-D echocardiogram on February 23 showing an EF of 25-30% with severe hypokinesis of the anteroseptal wall and akinesis of the apex.  No definite clot was seen.  Echo has been reviewed and decision has been made not to initiate Coumadin  therapy at this time until the patient is able to display compliance with recommendations and followup.  Further, he is currently on aspirin and Effient.  Ethan Wells will be discharged home today in good condition.  At this time, he remains on beta-blocker therapy, which we will consolidate to Toprol XL.  We have expressed to him our concern with regards to the risk of unopposed alpha in the setting of beta-blocker therapy and future cocaine usage.  He understands and says he is not going to be using cocaine going forward.  We have not switched him to carvedilol at this time, secondary to soft blood pressures.  We will consider early switching to carvedilol in the outpatient setting provided that blood pressures are  stable.  DISCHARGE LABORATORY DATA:  Hemoglobin 13.6, hematocrit 39.1, WBC 12.1, platelets 212.  Sodium 139, potassium 3.6, chloride 107, CO2 of 25, BUN 7, creatinine 0.83, glucose 130, total bilirubin 0.5, alkaline phosphatase 64, AST 206, ALT 38, total protein 6.9, albumin 3.7, calcium 9.4, magnesium 2.3, hemoglobin A1c 5.3, CK 1472, MB 120, troponin-I 38.29, total cholesterol 157, triglycerides 99, HDL 47, LDL 90, TSH 0.549.  Urine drug screen was positive for marijuana metabolites, benzodiazepine, cocaine metabolites, opiates.  MRSA screen was negative.  DISPOSITION:  The patient will be discharged home today in good condition.  FOLLOW-UP APPOINTMENTS:  We will arrange for follow up with Dr. Clifton James in approximately 7-10 days.  At that followup, we will obtain a repeat CBC and BMET and also consider switching to carvedilol and potentially adding ACE inhibitor if appropriate.  We will also reconsider Coumadin anticoagulation going forward for apical wall motion abnormality in the setting of ischemic cardiomyopathy.  The patient will ultimately require follow-up 2-D echocardiogram in 8-12 weeks for reassessment of LV function on medical therapy following revascularization.  DISCHARGE MEDICATIONS: 1. Aspirin 81 mg daily. 2. Lipitor 80 mg at bedtime. 3. Nitroglycerin 0.4 mg sublingual p.r.n. chest pain. 4. Prasugrel 10 mg daily. 5. Toprol XL 50 mg daily. 6. Vicodin 5/500 mg 1-2 tablets q.6 h. p.r.n.  OUTSTANDING LAB STUDIES:  Follow-up CBC and BMET in 7-10 days.  DURATION OF DISCHARGE ENCOUNTER:  60 minutes including physician's time.    Nicolasa Ducking, ANP   ______________________________ Jonelle Sidle, MD   CB/MEDQ  D:  02/09/2011  T:  02/09/2011  Job:  [id number]  Electronically Signed by Nicolasa Ducking ANP on 02/19/2011 04:13:03 PM Electronically Signed by Remi Deter Emelynn Rance MD on 02/22/2011 07:11:24 AM

## 2011-02-26 ENCOUNTER — Encounter: Payer: Self-pay | Admitting: Cardiovascular Disease

## 2011-02-26 ENCOUNTER — Other Ambulatory Visit (INDEPENDENT_AMBULATORY_CARE_PROVIDER_SITE_OTHER): Payer: Self-pay

## 2011-02-26 ENCOUNTER — Encounter (INDEPENDENT_AMBULATORY_CARE_PROVIDER_SITE_OTHER): Payer: Self-pay | Admitting: Cardiovascular Disease

## 2011-02-26 DIAGNOSIS — I2510 Atherosclerotic heart disease of native coronary artery without angina pectoris: Secondary | ICD-10-CM | POA: Insufficient documentation

## 2011-02-26 DIAGNOSIS — Z720 Tobacco use: Secondary | ICD-10-CM | POA: Insufficient documentation

## 2011-02-26 DIAGNOSIS — R0989 Other specified symptoms and signs involving the circulatory and respiratory systems: Secondary | ICD-10-CM

## 2011-02-26 DIAGNOSIS — I2589 Other forms of chronic ischemic heart disease: Secondary | ICD-10-CM

## 2011-02-26 DIAGNOSIS — I25119 Atherosclerotic heart disease of native coronary artery with unspecified angina pectoris: Secondary | ICD-10-CM | POA: Insufficient documentation

## 2011-02-26 DIAGNOSIS — F17200 Nicotine dependence, unspecified, uncomplicated: Secondary | ICD-10-CM

## 2011-02-26 DIAGNOSIS — I10 Essential (primary) hypertension: Secondary | ICD-10-CM

## 2011-03-04 ENCOUNTER — Telehealth: Payer: Self-pay | Admitting: Cardiovascular Disease

## 2011-03-05 NOTE — Assessment & Plan Note (Signed)
Summary: eph   Visit Type:  Follow-up   History of Present Illness: 40 yo WM with history of tobacco abuse, cocaine abuse with recent admission to [facility] 02/06/11 with acute anterior wall STEMI. HIs LAD was occluded. A bare metal stent was placed. EF of 25-30% by echo with akinesis of the anteroseptal wall and apex. He has been on ASA and Effient. He has had no chest pain or SOB. No dizziness, near syncope or syncope.  He continues to smoke 1/2 ppd. No recent cocaine use. Overall feels fatigued.   Current Medications (verified): 1)  Aspirin 81 Mg Tbec (Aspirin) .... Take One Tablet By Mouth Daily 2)  Effient 10 Mg Tabs (Prasugrel Hcl) .... Take One Tablet By Mouth Daily  Allergies (verified): 1)  ! Penicillin  Past History:  Past Medical History: CAD s/p anterior STEMI February 2012 Hypertension Ventricular Fib arrest  in setting of STEMI Ischemic Cardiomyopathy EF 25-30% with apical and anteroapical Akinesis Tobacco abuse  Past Surgical History: None  Family History: CAD  Social History: Tobacco Use - Yes.  Drug Use - yes -- cocaine and marijuana Single  Review of Systems       The patient complains of fatigue.  The patient denies malaise, fever, weight gain/loss, vision loss, decreased hearing, hoarseness, chest pain, palpitations, shortness of breath, prolonged cough, wheezing, sleep apnea, coughing up blood, abdominal pain, blood in stool, nausea, vomiting, diarrhea, heartburn, incontinence, blood in urine, muscle weakness, joint pain, leg swelling, rash, skin lesions, headache, fainting, dizziness, depression, anxiety, enlarged lymph nodes, easy bruising or bleeding, and environmental allergies.    Vital Signs:  Patient profile:   40 year old male Height:      65 inches Weight:      148 pounds BMI:     24.72 Pulse rate:   105 / minute Resp:     16 per minute BP sitting:   146 / 82  (left arm)  Vitals Entered By: Marrion Coy, CNA (February 26, 2011 1:43  PM)  Physical Exam  General:  General: Well developed, well nourished, NAD HEENT: OP clear, mucus membranes moist SKIN: warm, dry Neuro: No focal deficits Musculoskeletal: Muscle strength 5/5 all ext Psychiatric: Mood and affect normal Neck: No JVD, no carotid bruits, no thyromegaly, no lymphadenopathy. Lungs:Clear bilaterally, no wheezes, rhonci, crackles CV: RRR no murmurs, gallops rubs Abdomen: soft, NT, ND, BS present Extremities: No edema, pulses 2+.    Cardiac Cath  Procedure date:  02/06/2011  Findings:       1. The left main coronary artery had no obstructive disease.   2. The left anterior descending had plaque in the proximal segment and       was totally occluded in the distal portion of the proximal segment.   3. The circumflex artery had 20% mid plaque.   4. The ramus intermediate had a 50% tubular stenosis.   5. The right coronary artery was a large dominant vessel with mild       plaque disease in the mid vessel.   6. Left ventricular angiogram showed ejection fraction of 40% with       hypokinesis of the apex and inferoapical wall.  Impression & Recommendations:  Problem # 1:  CAD, NATIVE VESSEL (ICD-414.01) Stable. s/p acute anterior STEMI with bare metal stent LAD. Continue ASA and Effient. He has applied for the Effient assistance program. I will give him 60 days supply of Effient samples today.  He has stopped his other  meds because of cost. Will start Coreg 6.25 mg by mouth two times a day, Lisinopril 5 mg by mouth QDaily, Pravastatin 80 mg by mouth at bedtime.   The following medications were removed from the medication list:    Nitrostat 0.4 Mg Subl (Nitroglycerin) .Marland Kitchen... 1 tablet under tongue at onset of chest pain; you may repeat every 5 minutes for up to 3 doses.    Metoprolol Succinate 50 Mg Xr24h-tab (Metoprolol succinate) .Marland Kitchen... Take one tablet by mouth daily His updated medication list for this problem includes:    Aspirin 81 Mg Tbec (Aspirin)  .Marland Kitchen... Take one tablet by mouth daily    Effient 10 Mg Tabs (Prasugrel hcl) .Marland Kitchen... Take one tablet by mouth daily    Coreg 6.25 Mg Tabs (Carvedilol) .Marland Kitchen... Take one tablet by mouth two times a day.    Lisinopril 10 Mg Tabs (Lisinopril) .Marland Kitchen... Take one half tablet by mouth daily    Nitrostat 0.4 Mg Subl (Nitroglycerin) .Marland Kitchen... 1 tablet under tongue at onset of chest pain; you may repeat every 5 minutes for up to 3 doses.  The following medications were removed from the medication list:    Nitrostat 0.4 Mg Subl (Nitroglycerin) .Marland Kitchen... 1 tablet under tongue at onset of chest pain; you may repeat every 5 minutes for up to 3 doses.    Metoprolol Succinate 50 Mg Xr24h-tab (Metoprolol succinate) .Marland Kitchen... Take one tablet by mouth daily His updated medication list for this problem includes:    Aspirin 81 Mg Tbec (Aspirin) .Marland Kitchen... Take one tablet by mouth daily    Effient 10 Mg Tabs (Prasugrel hcl) .Marland Kitchen... Take one tablet by mouth daily    Coreg 6.25 Mg Tabs (Carvedilol) .Marland Kitchen... Take one tablet by mouth two times a day.    Lisinopril 10 Mg Tabs (Lisinopril) .Marland Kitchen... Take one half tablet by mouth daily  Problem # 2:  CARDIOMYOPATHY, ISCHEMIC (ICD-414.8) Repeat echo in 3 months. We will get him back on optimal medical therapy. If no improvement in EF in 3 months, he will need consideration for an ICD.   The following medications were removed from the medication list:    Nitrostat 0.4 Mg Subl (Nitroglycerin) .Marland Kitchen... 1 tablet under tongue at onset of chest pain; you may repeat every 5 minutes for up to 3 doses.    Metoprolol Succinate 50 Mg Xr24h-tab (Metoprolol succinate) .Marland Kitchen... Take one tablet by mouth daily His updated medication list for this problem includes:    Aspirin 81 Mg Tbec (Aspirin) .Marland Kitchen... Take one tablet by mouth daily    Effient 10 Mg Tabs (Prasugrel hcl) .Marland Kitchen... Take one tablet by mouth daily    Coreg 6.25 Mg Tabs (Carvedilol) .Marland Kitchen... Take one tablet by mouth two times a day.    Lisinopril 10 Mg Tabs (Lisinopril)  .Marland Kitchen... Take one half tablet by mouth daily    Nitrostat 0.4 Mg Subl (Nitroglycerin) .Marland Kitchen... 1 tablet under tongue at onset of chest pain; you may repeat every 5 minutes for up to 3 doses.  The following medications were removed from the medication list:    Nitrostat 0.4 Mg Subl (Nitroglycerin) .Marland Kitchen... 1 tablet under tongue at onset of chest pain; you may repeat every 5 minutes for up to 3 doses.    Metoprolol Succinate 50 Mg Xr24h-tab (Metoprolol succinate) .Marland Kitchen... Take one tablet by mouth daily His updated medication list for this problem includes:    Aspirin 81 Mg Tbec (Aspirin) .Marland Kitchen... Take one tablet by mouth daily    Effient 10 Mg Tabs (  Prasugrel hcl) .Marland Kitchen... Take one tablet by mouth daily    Coreg 6.25 Mg Tabs (Carvedilol) .Marland Kitchen... Take one tablet by mouth two times a day.    Lisinopril 10 Mg Tabs (Lisinopril) .Marland Kitchen... Take one half tablet by mouth daily  Problem # 3:  HYPERTENSION, BENIGN (ICD-401.1) Will resume meds today.   The following medications were removed from the medication list:    Metoprolol Succinate 50 Mg Xr24h-tab (Metoprolol succinate) .Marland Kitchen... Take one tablet by mouth daily His updated medication list for this problem includes:    Aspirin 81 Mg Tbec (Aspirin) .Marland Kitchen... Take one tablet by mouth daily    Coreg 6.25 Mg Tabs (Carvedilol) .Marland Kitchen... Take one tablet by mouth two times a day.    Lisinopril 10 Mg Tabs (Lisinopril) .Marland Kitchen... Take one half tablet by mouth daily  The following medications were removed from the medication list:    Metoprolol Succinate 50 Mg Xr24h-tab (Metoprolol succinate) .Marland Kitchen... Take one tablet by mouth daily His updated medication list for this problem includes:    Aspirin 81 Mg Tbec (Aspirin) .Marland Kitchen... Take one tablet by mouth daily    Coreg 6.25 Mg Tabs (Carvedilol) .Marland Kitchen... Take one tablet by mouth two times a day.    Lisinopril 10 Mg Tabs (Lisinopril) .Marland Kitchen... Take one half tablet by mouth daily  Problem # 4:  TOBACCO ABUSE (ICD-305.1) Smoking cessation encouraged.   Other  Orders: Echocardiogram (Echo)  Patient Instructions: 1)  Your physician recommends that you schedule a follow-up appointment in: 3 months 2)  Your physician has recommended you make the following change in your medication: START COREG 6.25mg  by mouth two times a day, START PRAVASTATIN 80mg  by mouth daily, and START LISINOPRIL 5mg  by mouth daily.  3)  Your physician has requested that you have an echocardiogram.  Echocardiography is a painless test that uses sound waves to create images of your heart. It provides your doctor with information about the size and shape of your heart and how well your heart's chambers and valves are working.  This procedure takes approximately one hour. There are no restrictions for this procedure. Prescriptions: NITROSTAT 0.4 MG SUBL (NITROGLYCERIN) 1 tablet under tongue at onset of chest pain; you may repeat every 5 minutes for up to 3 doses.  #25 x 3   Entered by:   [name] Maeola Sarah RN   Authorized by:   Verne Carrow, MD   Signed by:   Ellender Hose RN on 02/26/2011   Method used:   Electronically to        [facility] [address] (retail)       [address]       [place], [place]  [address]       Ph: [phone]       Fax: [phone]   RxID:   [id number] LISINOPRIL 10 MG TABS (LISINOPRIL) Take one half tablet by mouth daily  #30 x 6   Entered by:   [name] Maeola Sarah RN   Authorized by:   Verne Carrow, MD   Signed by:   Ellender Hose RN on 02/26/2011   Method used:   Electronically to        [facility] [address] (retail)       [address]       [place], [place]  [address]       Ph: [phone]  Fax: [phone]   RxID:   [id number] PRAVASTATIN SODIUM 80 MG TABS (PRAVASTATIN SODIUM) Take one tablet by mouth daily at bedtime  #30 x 6   Entered by:   [name] Maeola Sarah RN   Authorized by:   Verne Carrow, MD   Signed by:    Ellender Hose RN on 02/26/2011   Method used:   Electronically to        [facility] [address] (retail)       [address]       [place], [place]  [address]       Ph: [phone]       Fax: [phone]   RxID:   [id number] COREG 6.25 MG TABS (CARVEDILOL) take one tablet by mouth two times a day.  #60 x 6   Entered by:   [name] Maeola Sarah RN   Authorized by:   Verne Carrow, MD   Signed by:   Ellender Hose RN on 02/26/2011   Method used:   Electronically to        [facility] [address] (retail)       [address]       [place], [place]  [address]       Ph: [phone]       Fax: [phone]   RxID:   [id number]

## 2011-03-07 ENCOUNTER — Emergency Department: Payer: Self-pay | Admitting: Emergency Medicine
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
## 2011-03-07 IMAGING — CR DG SHOULDER 1V*L*
1 series · 1 of 1 positions shown · non-contrast
Comparison: none

REASON FOR EXAM: post reduction
COMMENTS:

[view not recorded]
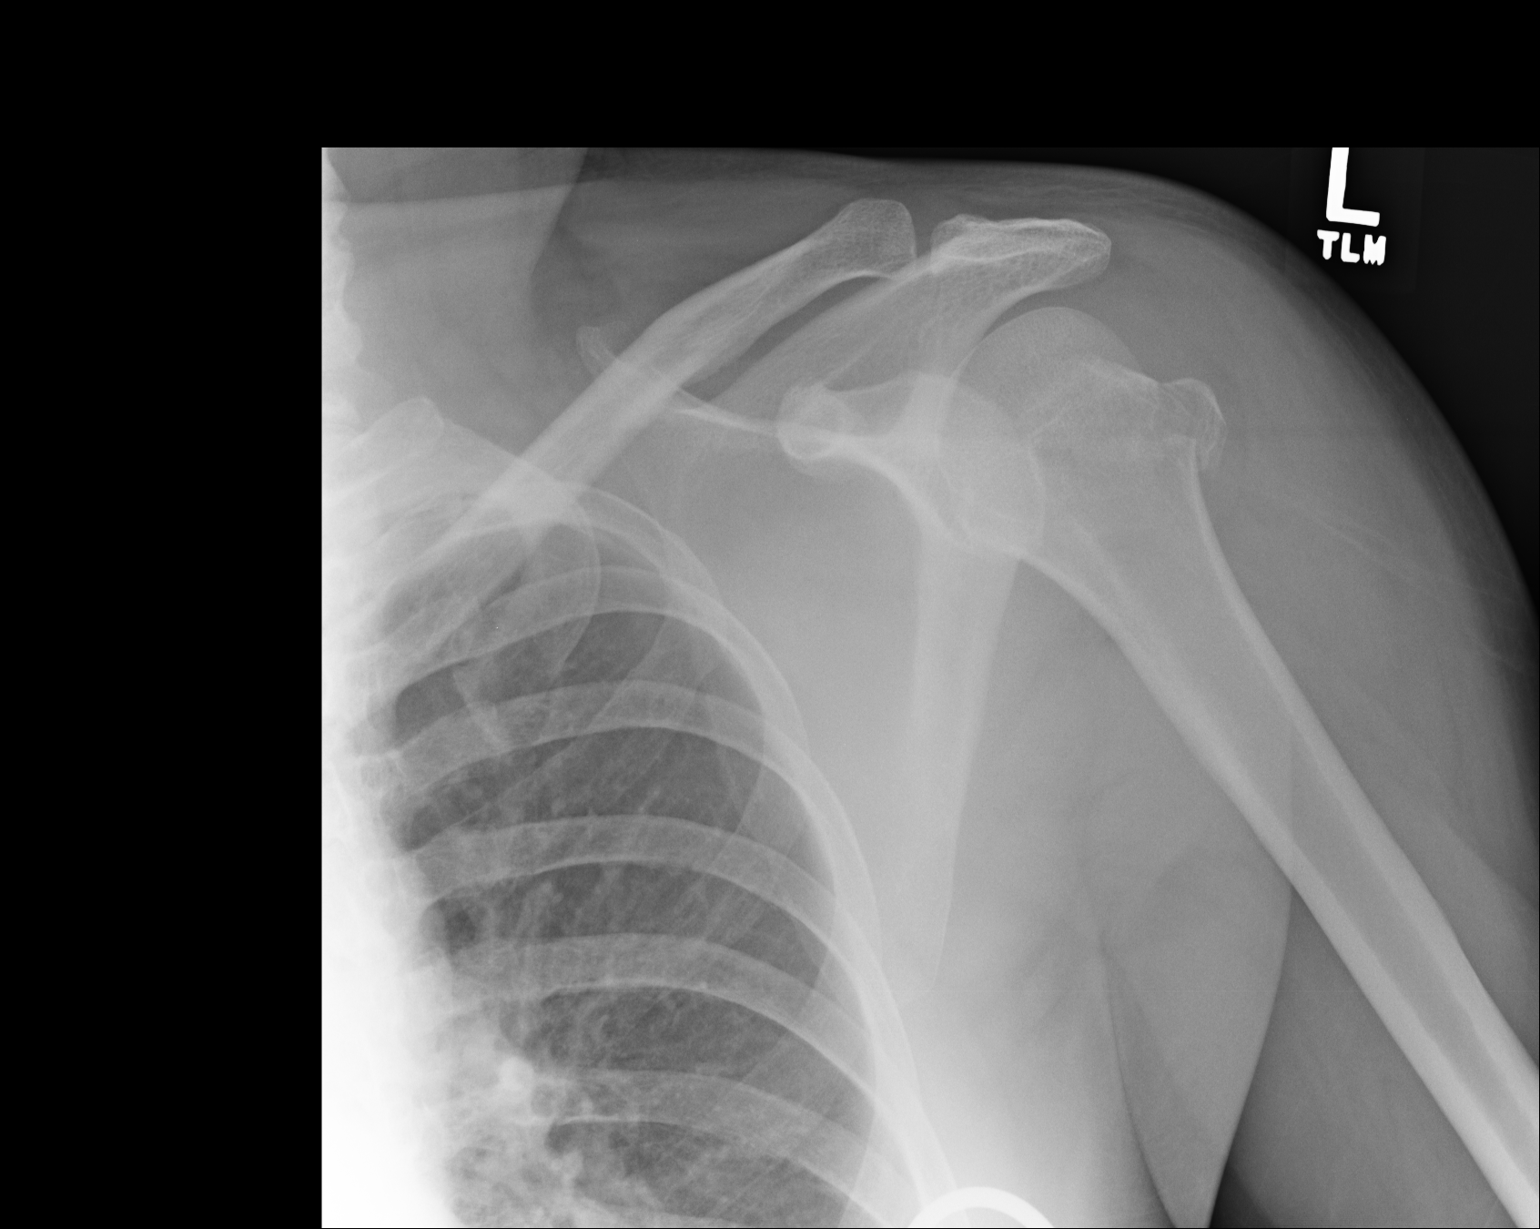

[1 of 1 positions shown; findings below may reference images not displayed]

PROCEDURE:     DXR - DXR SHOULDER LEFT ONE VIEW  - [DATE]  [DATE]

RESULT:     Comparison is made to the prior exam of earlier this date. The
previously noted inferior dislocation of the humeral head has been reduced.
Again noted is a mildly comminuted fracture of the lateral aspect of the
humeral head with there being a detached humeral head fragment.
IMPRESSION: Please see above.
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
## 2011-03-07 IMAGING — CR DG SHOULDER 3+V*L*
1 series · 4 of 4 positions shown · non-contrast
Comparison: none

REASON FOR EXAM: possible dislocation
COMMENTS:

[Series 1: view not recorded · 0.17mm/px · 4 of 4 slices shown]
[im 1/4]
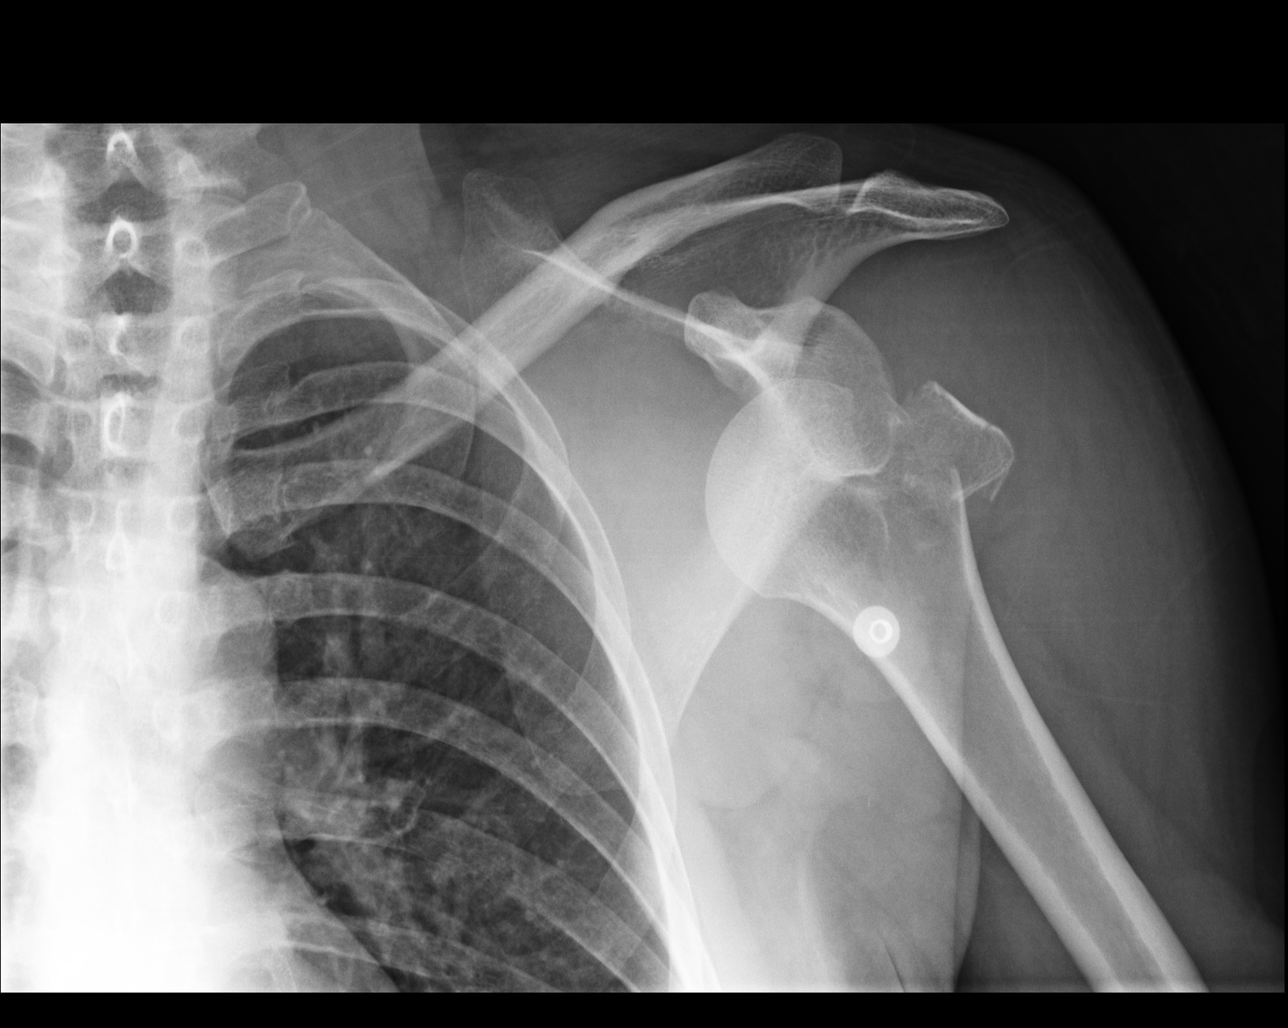
[im 2/4]
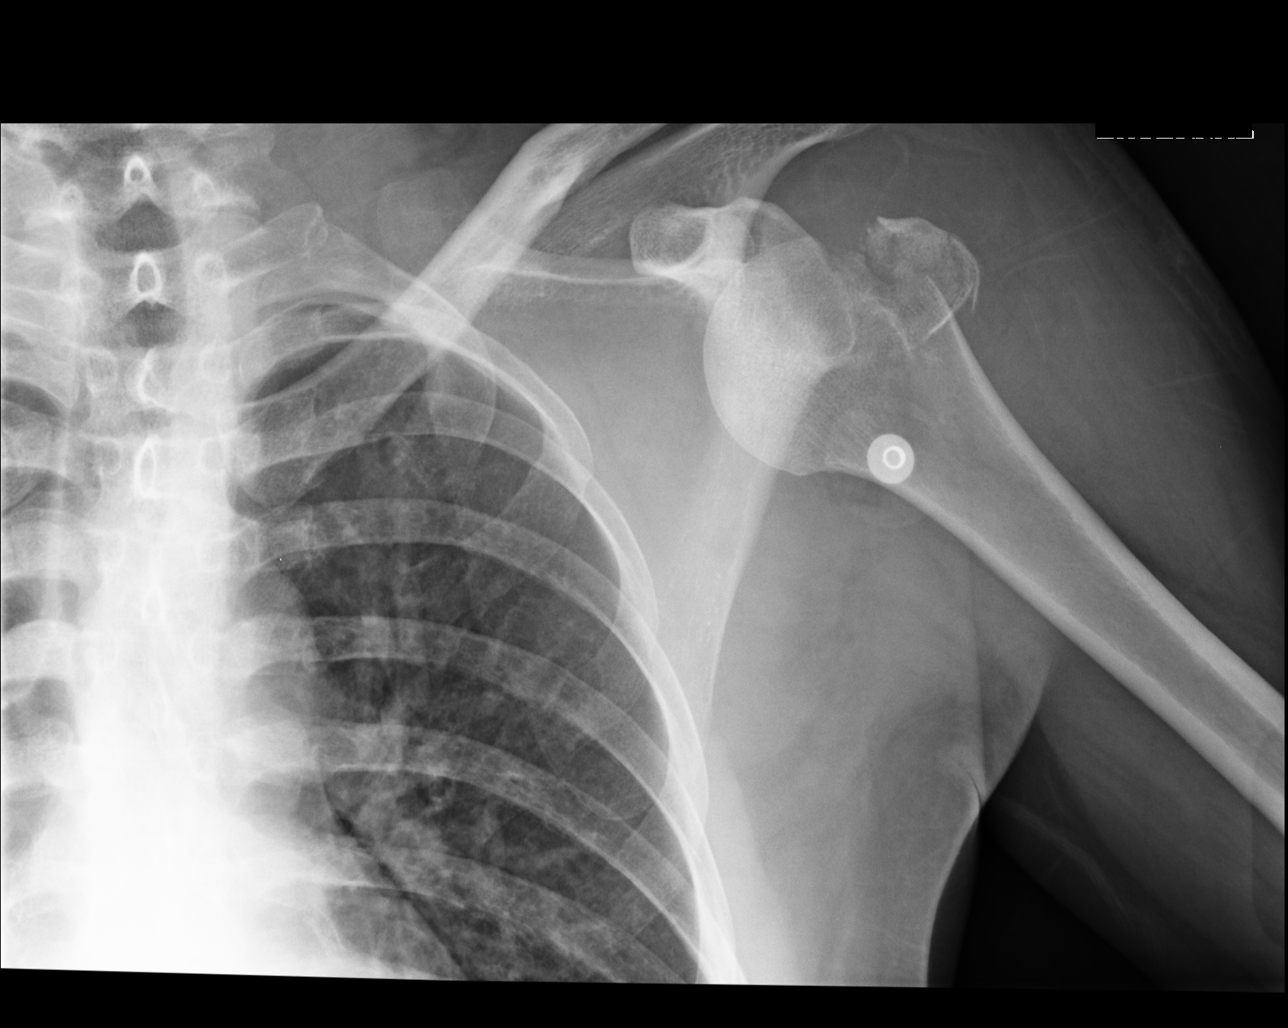
[im 3/4]
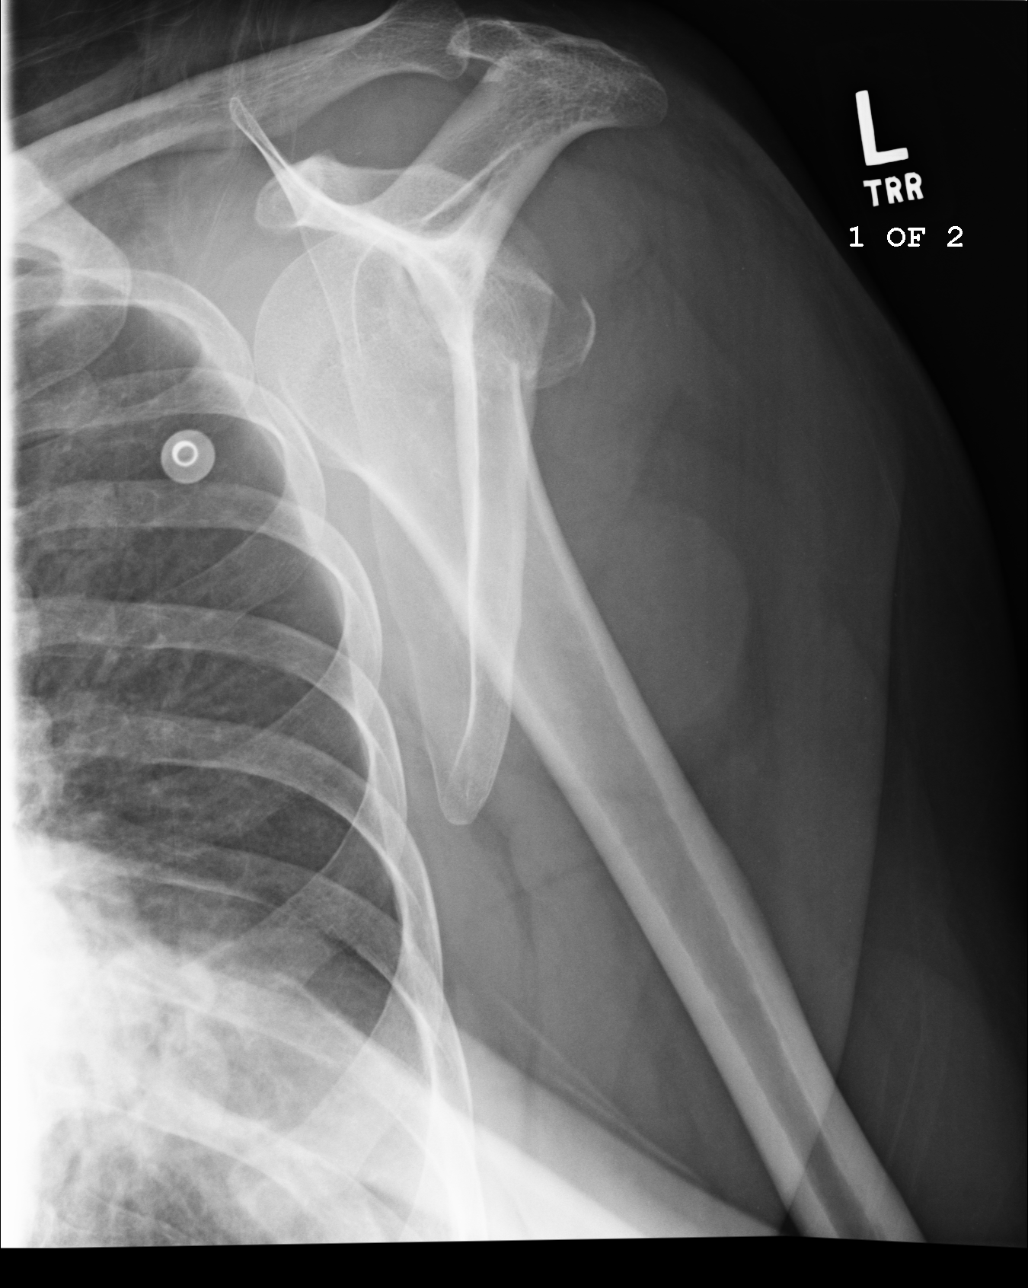
[im 4/4]
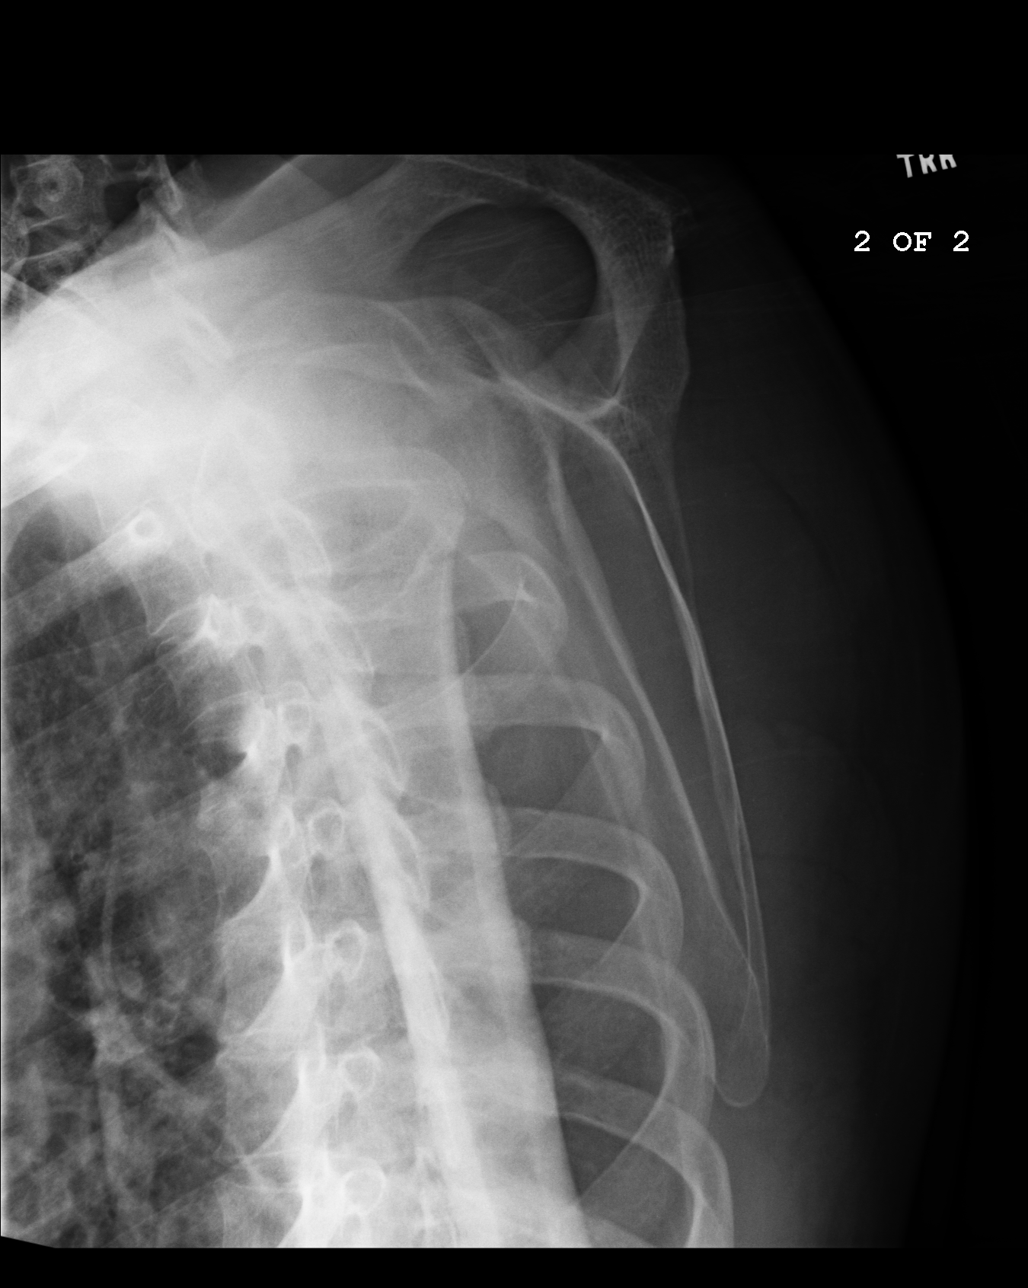

[4 of 4 positions shown; findings below may reference images not displayed]

PROCEDURE:     DXR - DXR SHOULDER LEFT COMPLETE  - [DATE]  [DATE]

RESULT:     There is a fracture of the humeral head with there being a
detached lateral fragment that appears to include the greater tuberosity.
Also noted is inferior subluxation of the remainder of the humeral head with
respect to the glenoid fossa.
IMPRESSION: There is a comminuted fracture of the humeral head with associated inferior
dislocation of the humeral head with respect to the glenoid fossa.

## 2011-03-10 ENCOUNTER — Telehealth: Payer: Self-pay | Admitting: Physician Assistant
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
## 2011-03-11 NOTE — Telephone Encounter (Signed)
Spoke with patient regarding a f/u with Dr. Clifton James. He denies CP since last night when he spoke to [facility]. He agrees to see Dr. Clifton James this week but will need to find transportation. Advised him I would speak with Dr. Clifton James to see if he needs to come in for an OV this week. He is aware to call our office with any further CP or report to the ER.

## 2011-03-13 ENCOUNTER — Emergency Department: Payer: Self-pay | Admitting: Emergency Medicine

## 2011-03-13 IMAGING — CR DG CHEST 2V
1 series · 3 of 3 positions shown · non-contrast
Comparison: none

REASON FOR EXAM: chest pain  right side rib pain
COMMENTS:

[Series 1: view not recorded · 0.17mm/px · 3 of 3 slices shown]
[im 1/3]
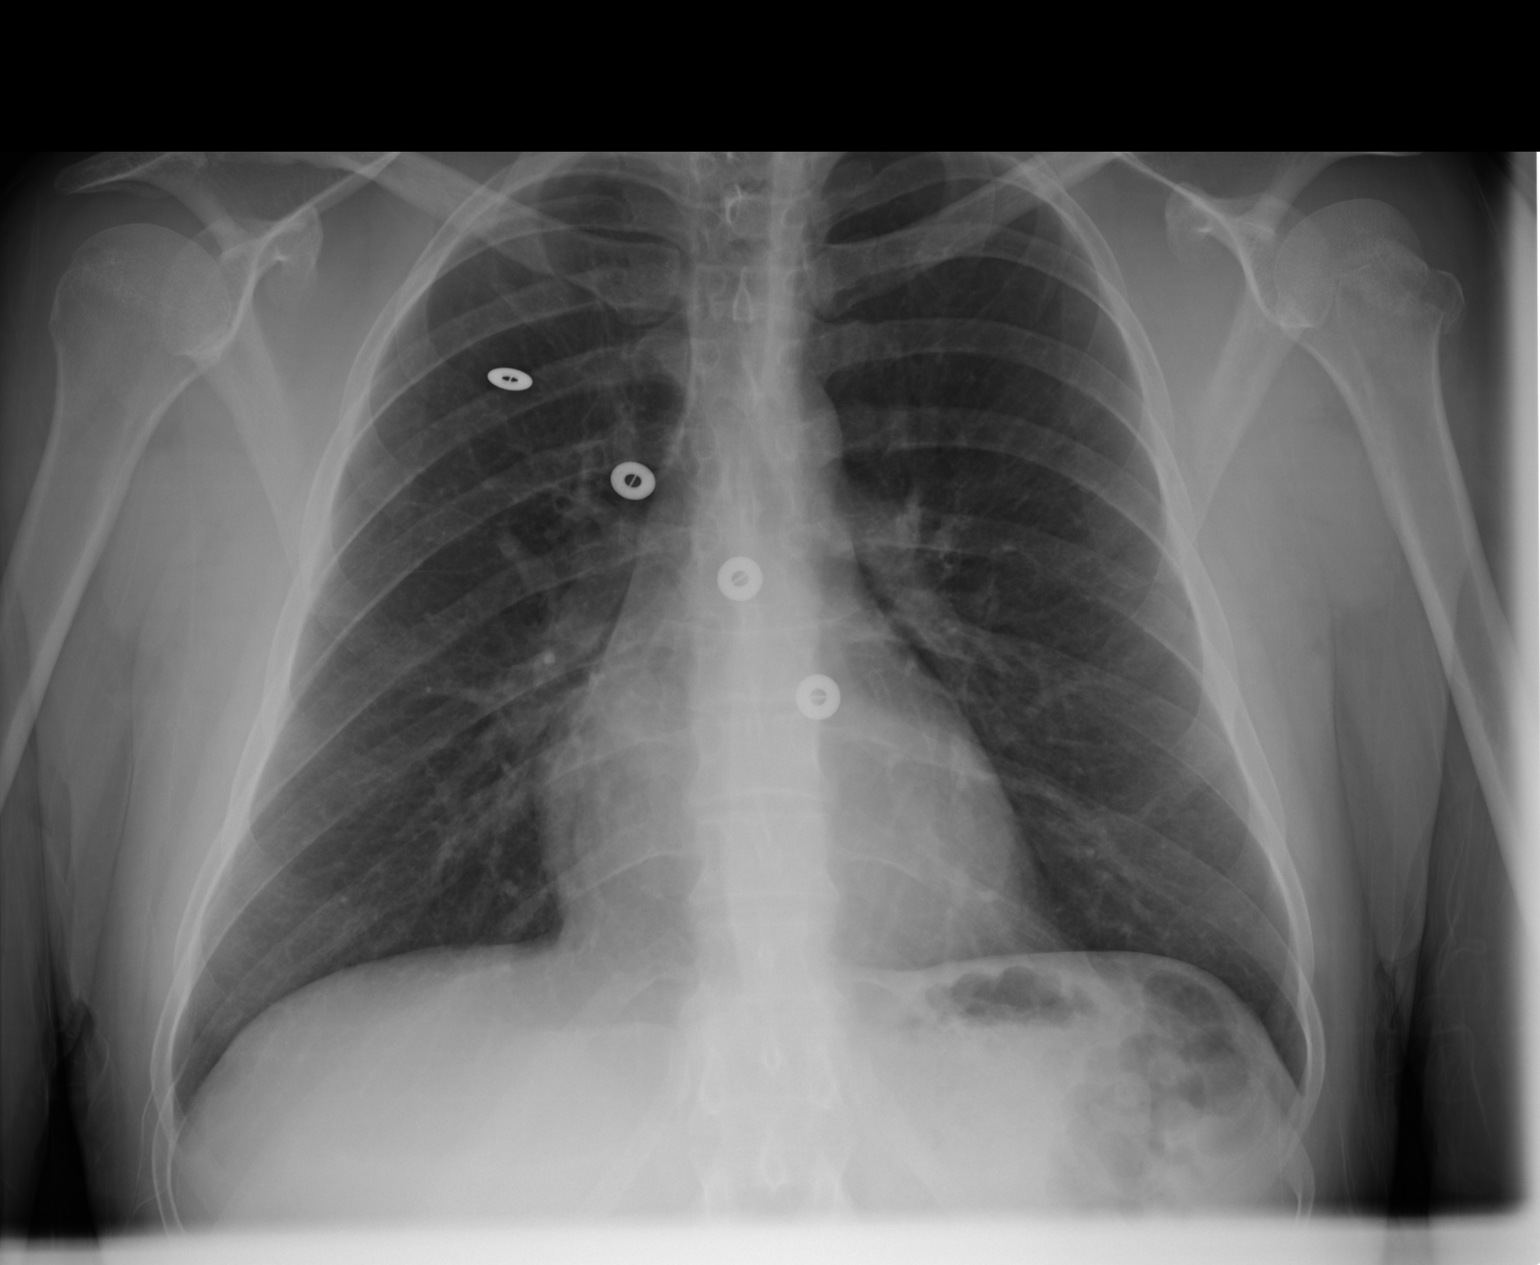
[im 2/3]
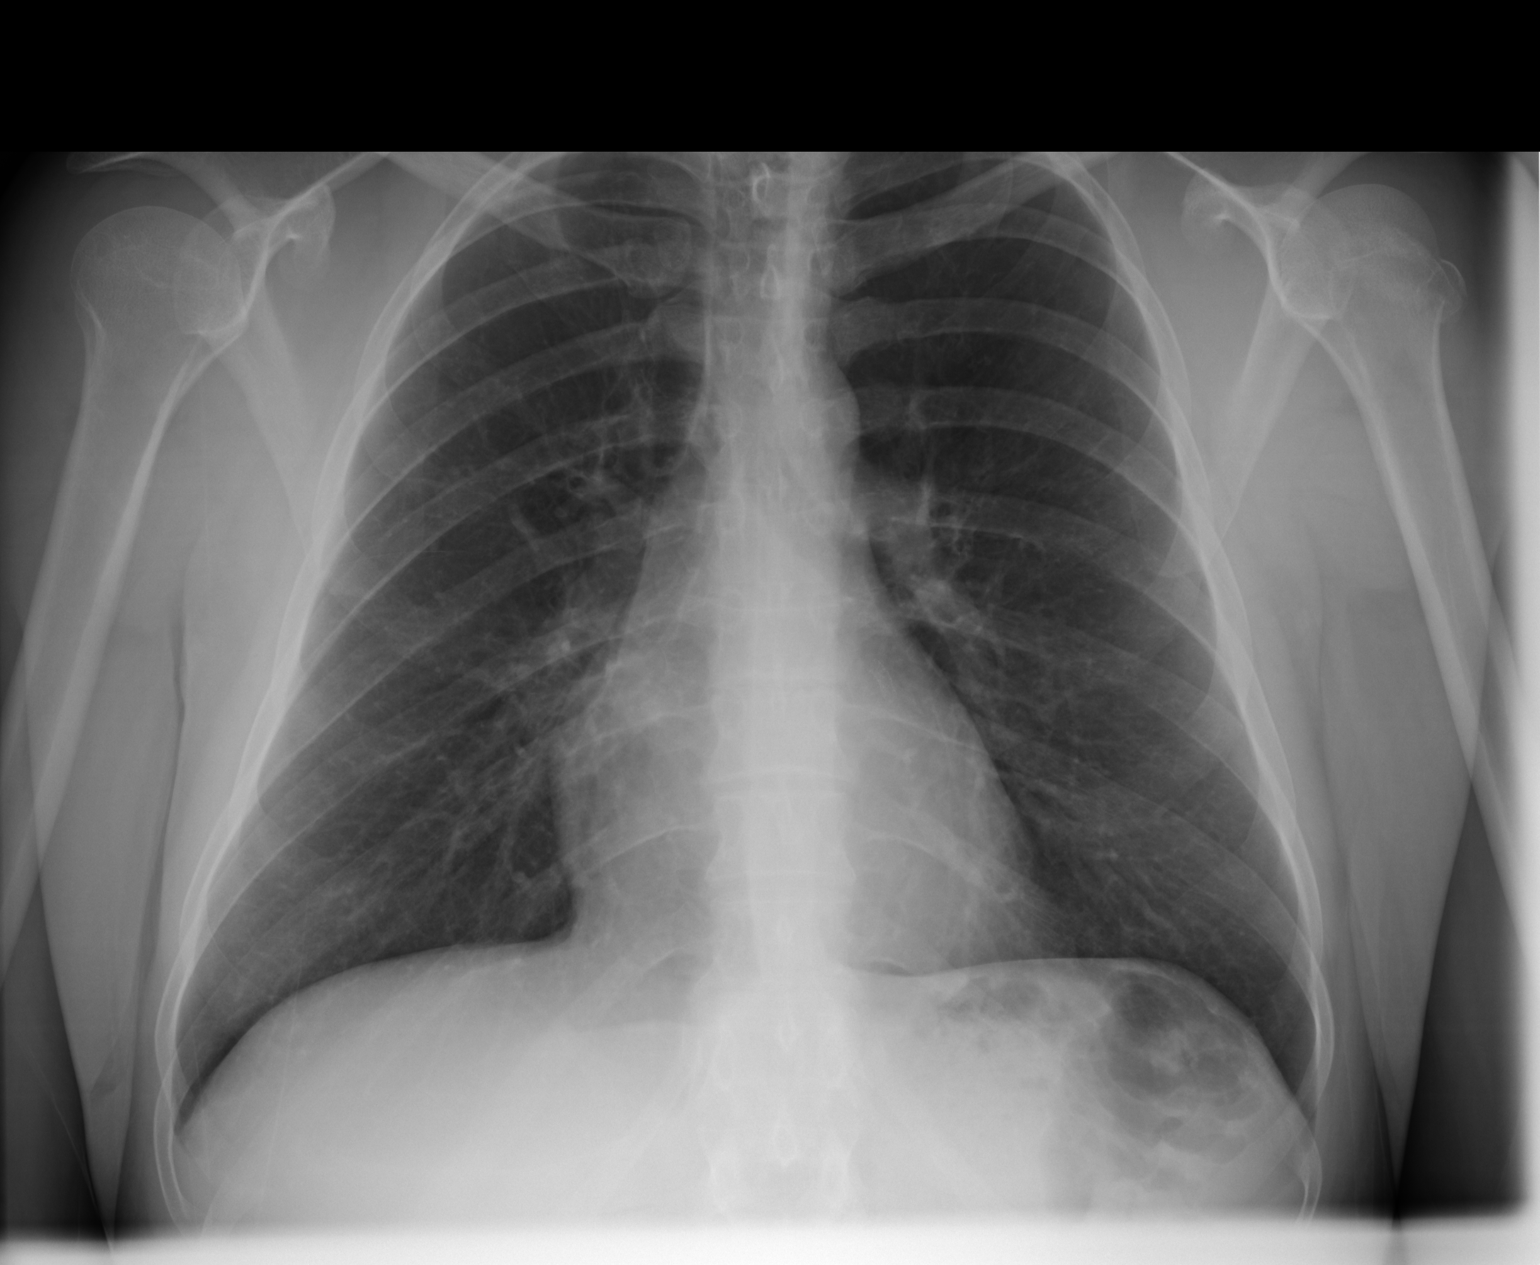
[im 3/3]
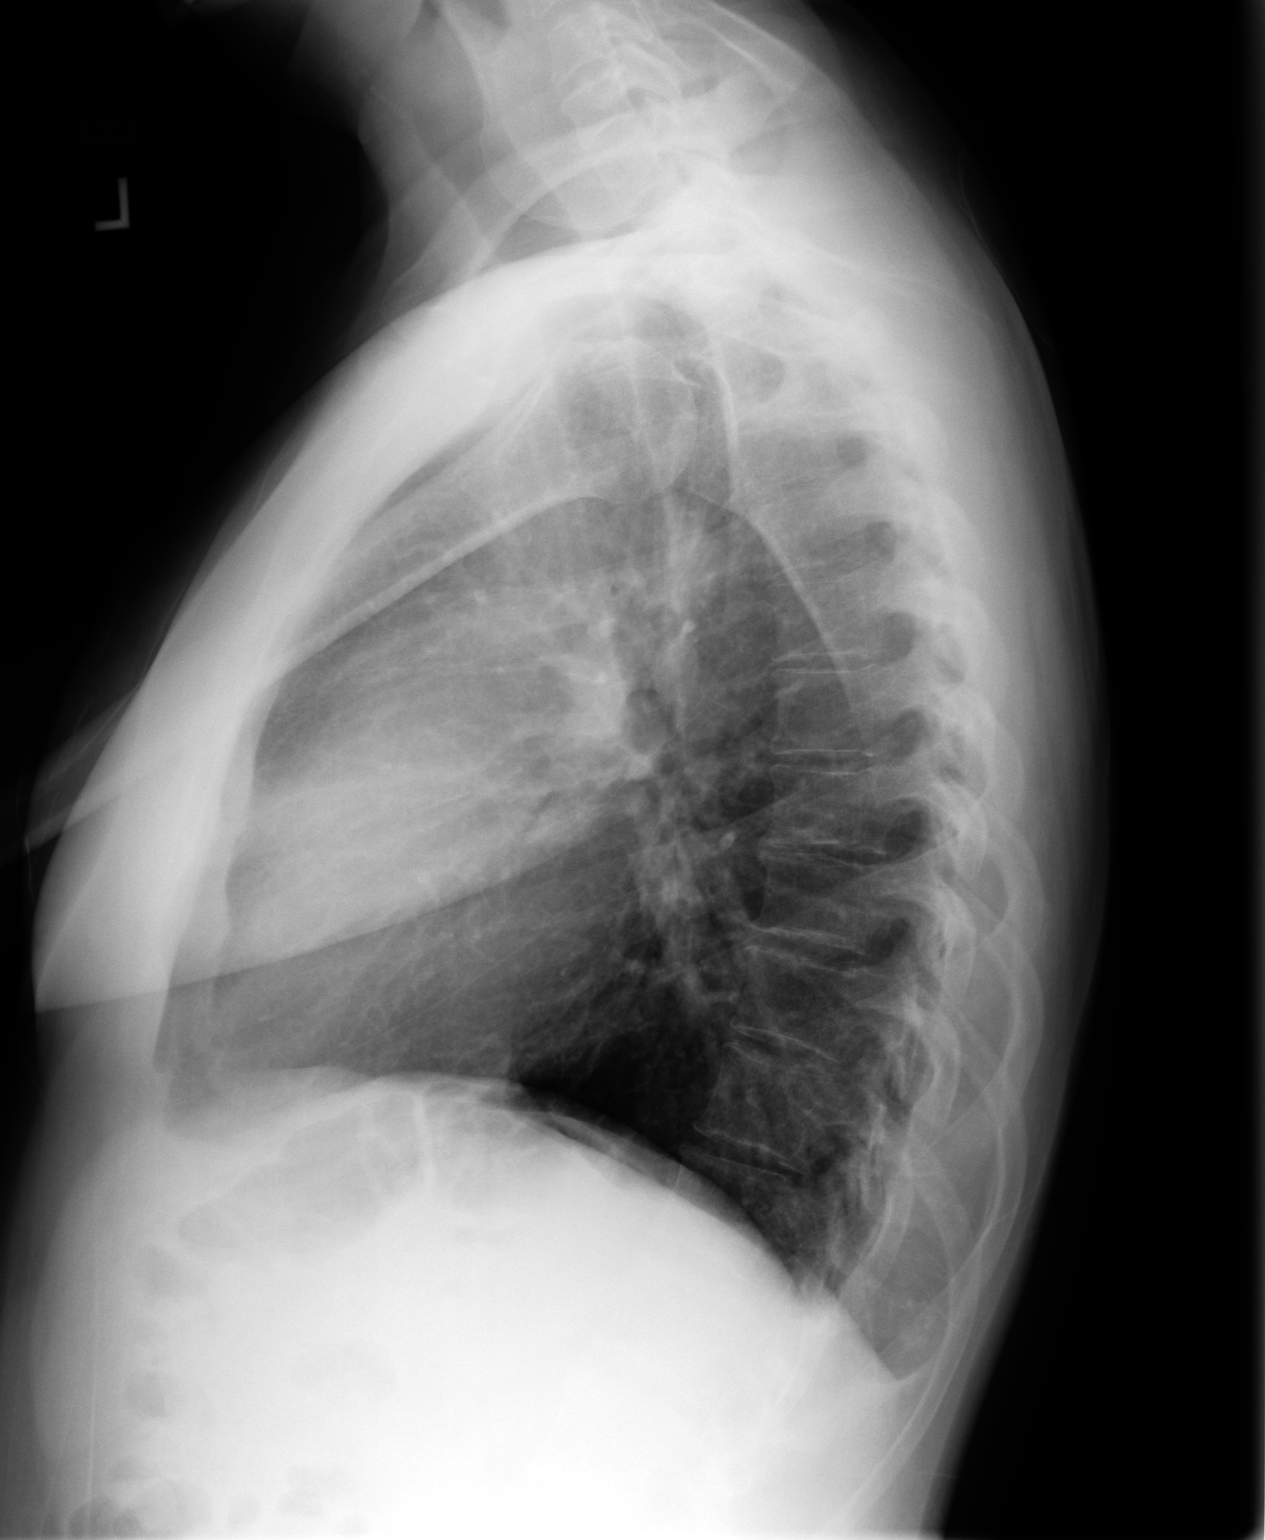

[3 of 3 positions shown; findings below may reference images not displayed]

PROCEDURE:     DXR - DXR CHEST PA (OR AP) AND LATERAL  - [DATE]  [DATE]

RESULT:     Comparison is made to the study of [DATE].

The lungs are clear. The heart and pulmonary vessels are normal. The
mediastinal structures are unremarkable. There is no effusion. There is no
pneumothorax or evidence of congestive failure. Stable appearance.
Persistent fracture in the proximal left humerus as noted previously.
IMPRESSION: No acute cardiopulmonary disease.

## 2011-03-13 NOTE — Telephone Encounter (Signed)
[name], Can we follow up with him and see how he is doing? Thanks, chris

## 2011-03-13 NOTE — Telephone Encounter (Signed)
Patient has been feeling okay & is taking his medication as prescribed. He will call us if he continues to experience chest discomfort.

## 2011-03-14 NOTE — Progress Notes (Signed)
Summary: question re meds  Phone Note Call from Patient Call back at Home Phone [phone]   Caller: Patient Reason for Call: Talk to Nurse Summary of Call: pt has question re meds Initial call taken by: Roe Coombs,  March 04, 2011 11:33 AM  Follow-up for Phone Call        Swithced his pravastatin to 40mg  by mouth two times a day since this is cheaper. [name] Maeola Sarah RN  March 04, 2011 11:45 AM    Follow-up by: [name] Maeola Sarah RN,  March 04, 2011 11:45 AM    New/Updated Medications: PRAVASTATIN SODIUM 40 MG TABS (PRAVASTATIN SODIUM) Take two tablets by mouth daily at bedtime Prescriptions: PRAVASTATIN SODIUM 40 MG TABS (PRAVASTATIN SODIUM) Take two tablets by mouth daily at bedtime  #60 x 6   Entered by:   [name] Maeola Sarah RN   Authorized by:   Verne Carrow, MD   Signed by:   Ellender Hose RN on 03/04/2011   Method used:   Electronically to        [facility] [address] (retail)       [address]       [place], [place]  [address]       Ph: [phone]       Fax: [phone]   RxID:   [id number]

## 2011-03-15 ENCOUNTER — Telehealth: Payer: Self-pay | Admitting: Cardiology

## 2011-03-15 NOTE — Telephone Encounter (Signed)
Pt calling back to let you know he has enough  effient to last two more months

## 2011-03-15 NOTE — Telephone Encounter (Signed)
Spoke with patient and he is aware that he needs to bring me a copy for his proof of income to re-fax to [employer] for his Effient. I explained to him that he should do this ASAP since he only has a few samples and it could take 1-2 weeks to arrive. He is aware.

## 2011-03-29 NOTE — H&P (Signed)
NAME:  Ethan Wells, Ethan Wells NO.:  [id number]  MEDICAL RECORD NO.:  [id number]           PATIENT TYPE:  E  LOCATION:  [facility]                         FACILITY:  [facility]  PHYSICIAN:  Cherene Altes, MD     DATE OF BIRTH:  [birth year]  DATE OF ADMISSION:  02/06/2011 DATE OF DISCHARGE:                             HISTORY & PHYSICAL   REASON FOR ADMISSION:  Anterior STEMI.  PRIMARY CARDIOLOGIST:  [facility], Dr. Clifton James.  PRIMARY MD:  None.  CHIEF COMPLAINT:  Chest pain.  HISTORY OF PRESENT ILLNESS:  Ethan Wells is a 39-year white male with a questionable history of diabetes without medical followup, who presented to the EMS, complaining of intermittent chest pain x3 days. At approximately 8:45 p.m., the pain became 10/10 and constant with radiation to his bilateral arms, severe shortness of breath, and diaphoresis.  On EMS arrival, the patient had anterior ST elevations. The patient had an episode of nonsustained ventricular tachycardia en route to the ED and was treated with 150 mg of IV amiodarone as well as 325 mg of aspirin and sublingual nitroglycerin.  Cardiac cath lab was activated for emergent cardiac catheterization.  PAST MEDICAL HISTORY:  Possible history of diabetes without current medical care.  ALLERGIES:  PENICILLIN.  MEDICATIONS:  None.  SOCIAL HISTORY:  Unknown employment history.  Positive tobacco use. Positive alcohol use.  Positive cocaine use and known x1 week.  FAMILY HISTORY:  The patient states extensive history of coronary artery disease, though is not able to be more specific about family members.  REVIEW OF SYSTEMS:  Negative except per HPI.  PHYSICAL EXAM:  VITAL SIGNS:  Afebrile, pulse 84, respiratory rate 20, blood pressure 167/119, O2 sats 99% on 2 liters nasal cannula. GENERAL:  A diaphoretic thin white male, alert and oriented x3, in severe distress. HEENT:  Normocephalic, atraumatic.  Extraocular movements  intact. Mucous membranes moist.  Dentition normal. NECK:  Supple without lymphadenopathy.  No JVD. CARDIOVASCULAR:  Regular rate and rhythm.  Normal S1 and S2.  No murmurs, gallops, or rubs.  Pulses 2+ and equal bilaterally.  LUNGS: Clear to auscultation bilaterally on anterior exam. SKIN:  Warm and dry. ABDOMEN:  Benign. EXTREMITIES:  No cyanosis, clubbing, or edema. MUSCULOSKELETAL:  No joint deformity or effusion. NEURO:  Alert and oriented x3.  Cranial nerves II through XII grossly intact.  Spontaneously moving all four limbs.  EKG:  Evolving anterior ST elevation with final EKG in ER prior to cardiac catheterization, anterior ST elevation with new inferior ST elevation.  LABS:  Pending.  IMPRESSION AND PLAN: 1. Anterior ST-segment elevation myocardial infarction.  Risk factors     include tobacco abuse, cocaine abuse, and a possible history of     diabetes.  The patient also states he has an extensive family     history that is unknown for early coronary artery disease.  The     patient had acute onset of chest pain and EKG changes consistent     with an anterior ST-segment elevation myocardial infarction,     therefore he was taken for emergent cardiac catheterization.  On  arrival to EMS, the patient did have a nonsustained ventricular     tachycardia and was treated with 150 mg bolus of IV amiodarone.  On     arrival to the cardiac cath lab, the patient had ventricular     fibrillation that was successfully treated with defibrillation x1.     The patient is now status post bare-metal stent to his proximal     left anterior descending.  The patient was treated with aspirin, IV     heparin, Plavix 600 mg p.o. in the emergency department, and a     nitroglycerin drip.  The patient was also treated with Angiomax     during the cardiac catheterization.  We will continue the patient     on Plavix, aspirin, and initiate a beta-blocker and high-dose     statin and monitor the  patient in CCU with serial cardiac enzymes. 2. Tobacco abuse.  The patient is counseled on smoking cessation. 3. Polysubstance abuse.  The patient will be counseled on cocaine and     alcohol cessation. 4. Questionable history of diabetes.  The patient report no medical     history, though EMS said that the patient did give a history of     diabetes though he is not currently under the care of a physician.     Fingerstick glucose by EMS was 90.  We will place the patient on a     sliding scale insulin for now and obtain the hemoglobin A1c.     Cherene Altes, MD     PS/MEDQ  D:  02/06/2011  T:  02/06/2011  Job:  [id number]  Electronically Signed by Cherene Altes M.D. on 03/29/2011 08:19:35 PM

## 2011-04-10 ENCOUNTER — Telehealth: Payer: Self-pay | Admitting: Cardiovascular Disease

## 2011-04-10 NOTE — Telephone Encounter (Signed)
Ext [phone]. Pt needs a written rx for crestor. Fax# [phone]

## 2011-04-11 NOTE — Telephone Encounter (Signed)
Pt rtn call-pls call [phone]

## 2011-04-11 NOTE — Telephone Encounter (Signed)
Called patient and LVMTCB. I'm not sure who put him on Crestor. He was taking 80 mg of Pravastatin daily.Marland Kitchen?

## 2011-04-11 NOTE — Telephone Encounter (Signed)
Patient had some confusion regarding his medication. He is having difficulty affording them but they are all generic. He has been taking samples of Atorvastatin from his sister since he can not afford Pravastatin. He will pick it up when he gets a chance.

## 2011-05-15 ENCOUNTER — Encounter: Payer: Self-pay | Admitting: Cardiovascular Disease

## 2011-05-17 ENCOUNTER — Telehealth: Payer: Self-pay | Admitting: Cardiovascular Disease

## 2011-05-17 ENCOUNTER — Other Ambulatory Visit (HOSPITAL_COMMUNITY): Payer: Self-pay

## 2011-05-17 NOTE — Telephone Encounter (Signed)
SPOKE WITH PT C/O   HAS SOME TYPE OF BACTERIAL INFECTION IN BLOOD STREAM HAS STOPPED EFFIENT  X 2 DAYS AGO NOT SURE IF NEEDS TO TAKE WHILE HAS INFECTION INFORMED PT NEEDS TO TAKE EFFIENT DUE TO HAVING STENTS WILL FORWARD TO DR Clifton James PT VERBALIZED UNDERSTANDING./CY

## 2011-05-17 NOTE — Telephone Encounter (Signed)
Pt would like for you to call him.  Would give no infor regarding what call was about.  [phone]

## 2011-05-20 NOTE — Telephone Encounter (Signed)
Agree. cdm 

## 2011-05-20 NOTE — Telephone Encounter (Signed)
Pt aware to continue Effient and states that he is taking it

## 2011-05-23 ENCOUNTER — Other Ambulatory Visit (HOSPITAL_COMMUNITY): Payer: Self-pay

## 2011-05-23 ENCOUNTER — Other Ambulatory Visit (HOSPITAL_COMMUNITY): Payer: Self-pay | Admitting: Radiology

## 2011-05-23 ENCOUNTER — Ambulatory Visit: Payer: Self-pay | Admitting: Cardiovascular Disease

## 2011-05-23 DIAGNOSIS — I255 Ischemic cardiomyopathy: Secondary | ICD-10-CM

## 2011-06-27 ENCOUNTER — Encounter: Payer: Self-pay | Admitting: Cardiovascular Disease

## 2011-07-02 ENCOUNTER — Encounter: Payer: Self-pay | Admitting: Cardiovascular Disease

## 2011-07-02 ENCOUNTER — Ambulatory Visit (HOSPITAL_COMMUNITY): Payer: Self-pay | Attending: Cardiovascular Disease | Admitting: Radiology

## 2011-07-02 ENCOUNTER — Ambulatory Visit (INDEPENDENT_AMBULATORY_CARE_PROVIDER_SITE_OTHER): Payer: Self-pay | Admitting: Cardiovascular Disease

## 2011-07-02 VITALS — BP 110/76 | HR 74 | Resp 16 | Ht 65.0 in | Wt 148.0 lb

## 2011-07-02 DIAGNOSIS — I2510 Atherosclerotic heart disease of native coronary artery without angina pectoris: Secondary | ICD-10-CM | POA: Insufficient documentation

## 2011-07-02 DIAGNOSIS — R5383 Other fatigue: Secondary | ICD-10-CM | POA: Insufficient documentation

## 2011-07-02 DIAGNOSIS — I10 Essential (primary) hypertension: Secondary | ICD-10-CM | POA: Insufficient documentation

## 2011-07-02 DIAGNOSIS — I255 Ischemic cardiomyopathy: Secondary | ICD-10-CM

## 2011-07-02 DIAGNOSIS — F17200 Nicotine dependence, unspecified, uncomplicated: Secondary | ICD-10-CM | POA: Insufficient documentation

## 2011-07-02 DIAGNOSIS — R5381 Other malaise: Secondary | ICD-10-CM | POA: Insufficient documentation

## 2011-07-02 DIAGNOSIS — I059 Rheumatic mitral valve disease, unspecified: Secondary | ICD-10-CM | POA: Insufficient documentation

## 2011-07-02 DIAGNOSIS — I079 Rheumatic tricuspid valve disease, unspecified: Secondary | ICD-10-CM | POA: Insufficient documentation

## 2011-07-02 NOTE — Assessment & Plan Note (Signed)
Smoking cessation encouraged!

## 2011-07-02 NOTE — Assessment & Plan Note (Signed)
Stable. Continue current meds.   

## 2011-07-02 NOTE — Progress Notes (Signed)
History of Present Illness:40 yo WM with history of tobacco abuse, cocaine abuse with admission to [facility] 02/06/11 with acute anterior wall STEMI. HIs LAD was occluded. A bare metal stent was placed. EF of 25-30% by echo with akinesis of the anteroseptal wall and apex. He has been on ASA and Effient. He has had no chest pain or SOB. No dizziness, near syncope or syncope. NO energy. He continues to smoke 1/2 ppd. No recent cocaine use.  Past Medical History  Diagnosis Date  . CAD (coronary artery disease) 01/2011    S/P anterior STEMI  . Hypertension   . Cardiac arrest - ventricular fibrillation     In setting of STEMI  . Ischemic cardiomyopathy     EF 25-30% with apical and anteroapical Akinesis  . Tobacco abuse     No past surgical history on file.  Current Outpatient Prescriptions  Medication Sig Dispense Refill  . aspirin 81 MG EC tablet Take 81 mg by mouth daily.        . carvedilol (COREG) 6.25 MG tablet Take 6.25 mg by mouth 2 (two) times daily with a meal.        . lisinopril (PRINIVIL,ZESTRIL) 10 MG tablet Take 5 mg by mouth daily.        . nitroGLYCERIN (NITROSTAT) 0.4 MG SL tablet Place 0.4 mg under the tongue every 5 (five) minutes as needed. May repeat for up to 3 doses.       . prasugrel (EFFIENT) 10 MG TABS Take 10 mg by mouth daily.        . pravastatin (PRAVACHOL) 40 MG tablet Take 80 mg by mouth at bedtime.          Allergies  Allergen Reactions  . Penicillins     History   Social History  . Marital Status: Single    Spouse Name: N/A    Number of Children: N/A  . Years of Education: N/A   Occupational History  . Not on file.   Social History Main Topics  . Smoking status: Smoker, Current Status Unknown  . Smokeless tobacco: Not on file  . Alcohol Use: Not on file  . Drug Use: Yes    Special: Cocaine, Marijuana  . Sexually Active: Not on file   Other Topics Concern  . Not on file   Social History Narrative   Single    Family History   Problem Relation Age of Onset  . Coronary artery disease Other     Review of Systems:  As stated in the HPI and otherwise negative.   BP 110/76  Pulse 74  Resp 16  Ht 5\' 5"  (1.651 m)  Wt 148 lb (67.132 kg)  BMI 24.63 kg/m2  Physical Examination: General: Well developed, well nourished, NAD HEENT: OP clear, mucus membranes moist SKIN: warm, dry. No rashes. Neuro: No focal deficits Musculoskeletal: Muscle strength 5/5 all ext Psychiatric: Mood and affect normal Neck: No JVD, no carotid bruits, no thyromegaly, no lymphadenopathy. Lungs:Clear bilaterally, no wheezes, rhonci, crackles Cardiovascular: Regular rate and rhythm. No murmurs, gallops or rubs. Abdomen:Soft. Bowel sounds present. Non-tender.  Extremities: No lower extremity edema. Pulses are 2 + in the bilateral DP/PT.  EKG:NSR, rate 76 bpm. Normal EKG  Echo: 07/02/11: Left ventricle: The cavity size was normal. Wall thickness was normal. Systolic function was mildly reduced. The estimated ejection fraction was in the range of 45% to 50%. Wall motion was normal; there were no regional wall motion abnormalities. Left ventricular diastolic  function parameters were normal.

## 2011-08-30 ENCOUNTER — Telehealth: Payer: Self-pay | Admitting: Cardiovascular Disease

## 2011-08-30 NOTE — Telephone Encounter (Signed)
Pt calling re paperwork

## 2011-08-30 NOTE — Telephone Encounter (Signed)
Called patient back. He would like to fax paperwork to Dr.MCAlhany from his lawyer. I provided him with the fax number to our office.

## 2011-12-07 ENCOUNTER — Other Ambulatory Visit: Payer: Self-pay

## 2011-12-07 ENCOUNTER — Encounter (HOSPITAL_COMMUNITY): Payer: Self-pay | Admitting: *Deleted

## 2011-12-07 ENCOUNTER — Emergency Department (HOSPITAL_COMMUNITY)
Admission: EM | Admit: 2011-12-07 | Discharge: 2011-12-08 | Disposition: A | Discharge status: Home / Self Care | Payer: Self-pay | Attending: Emergency Medicine | Admitting: Emergency Medicine

## 2011-12-07 ENCOUNTER — Emergency Department (HOSPITAL_COMMUNITY): Payer: Self-pay

## 2011-12-07 DIAGNOSIS — Z91199 Patient's noncompliance with other medical treatment and regimen due to unspecified reason: Secondary | ICD-10-CM | POA: Insufficient documentation

## 2011-12-07 DIAGNOSIS — Z9861 Coronary angioplasty status: Secondary | ICD-10-CM | POA: Insufficient documentation

## 2011-12-07 DIAGNOSIS — I2510 Atherosclerotic heart disease of native coronary artery without angina pectoris: Secondary | ICD-10-CM | POA: Insufficient documentation

## 2011-12-07 DIAGNOSIS — Z9119 Patient's noncompliance with other medical treatment and regimen: Secondary | ICD-10-CM | POA: Insufficient documentation

## 2011-12-07 DIAGNOSIS — R079 Chest pain, unspecified: Secondary | ICD-10-CM | POA: Insufficient documentation

## 2011-12-07 DIAGNOSIS — I10 Essential (primary) hypertension: Secondary | ICD-10-CM | POA: Insufficient documentation

## 2011-12-07 LAB — DIFFERENTIAL
Basophils Absolute: 0 10*3/uL (ref 0.0–0.1)
Basophils Relative: 0 % (ref 0–1)
Eosinophils Absolute: 0.1 10*3/uL (ref 0.0–0.7)
Eosinophils Relative: 1 % (ref 0–5)
Lymphocytes Relative: 22 % (ref 12–46)
Lymphs Abs: 2.5 10*3/uL (ref 0.7–4.0)
Monocytes Absolute: 0.8 10*3/uL (ref 0.1–1.0)
Monocytes Relative: 7 % (ref 3–12)
Neutro Abs: 7.9 10*3/uL — ABNORMAL HIGH (ref 1.7–7.7)
Neutrophils Relative %: 71 % (ref 43–77)

## 2011-12-07 LAB — CBC
HCT: 42.4 % (ref 39.0–52.0)
Hemoglobin: 15.4 g/dL (ref 13.0–17.0)
MCH: 34.8 pg — ABNORMAL HIGH (ref 26.0–34.0)
MCHC: 36.3 g/dL — ABNORMAL HIGH (ref 30.0–36.0)
MCV: 95.7 fL (ref 78.0–100.0)
Platelets: 243 10*3/uL (ref 150–400)
RBC: 4.43 MIL/uL (ref 4.22–5.81)
RDW: 12.9 % (ref 11.5–15.5)
WBC: 11.3 10*3/uL — ABNORMAL HIGH (ref 4.0–10.5)

## 2011-12-07 LAB — COMPREHENSIVE METABOLIC PANEL
ALT: 20 U/L (ref 0–53)
AST: 26 U/L (ref 0–37)
Albumin: 4.1 g/dL (ref 3.5–5.2)
Alkaline Phosphatase: 72 U/L (ref 39–117)
BUN: 4 mg/dL — ABNORMAL LOW (ref 6–23)
CO2: 21 mEq/L (ref 19–32)
Calcium: 9.5 mg/dL (ref 8.4–10.5)
Chloride: 103 mEq/L (ref 96–112)
Creatinine, Ser: 0.66 mg/dL (ref 0.50–1.35)
GFR calc Af Amer: >90 mL/min (ref >90)
GFR calc non Af Amer: >90 mL/min (ref >90)
Glucose, Bld: 93 mg/dL (ref 70–99)
Potassium: 4.2 meq/L (ref 3.5–5.1)
Sodium: 135 meq/L (ref 135–145)
Total Bilirubin: 0.4 mg/dL (ref 0.3–1.2)
Total Protein: 7.5 g/dL (ref 6.0–8.3)

## 2011-12-07 LAB — POCT I-STAT TROPONIN I: Troponin i, poc: 0 ng/mL (ref 0.00–0.08)

## 2011-12-07 IMAGING — CR DG CHEST 2V
2 series · 2 of 2 positions shown · non-contrast
Comparison: None.

CLINICAL DATA: Chest pain and shortness of breath.

CHEST - 2 VIEW

[w chest pa]
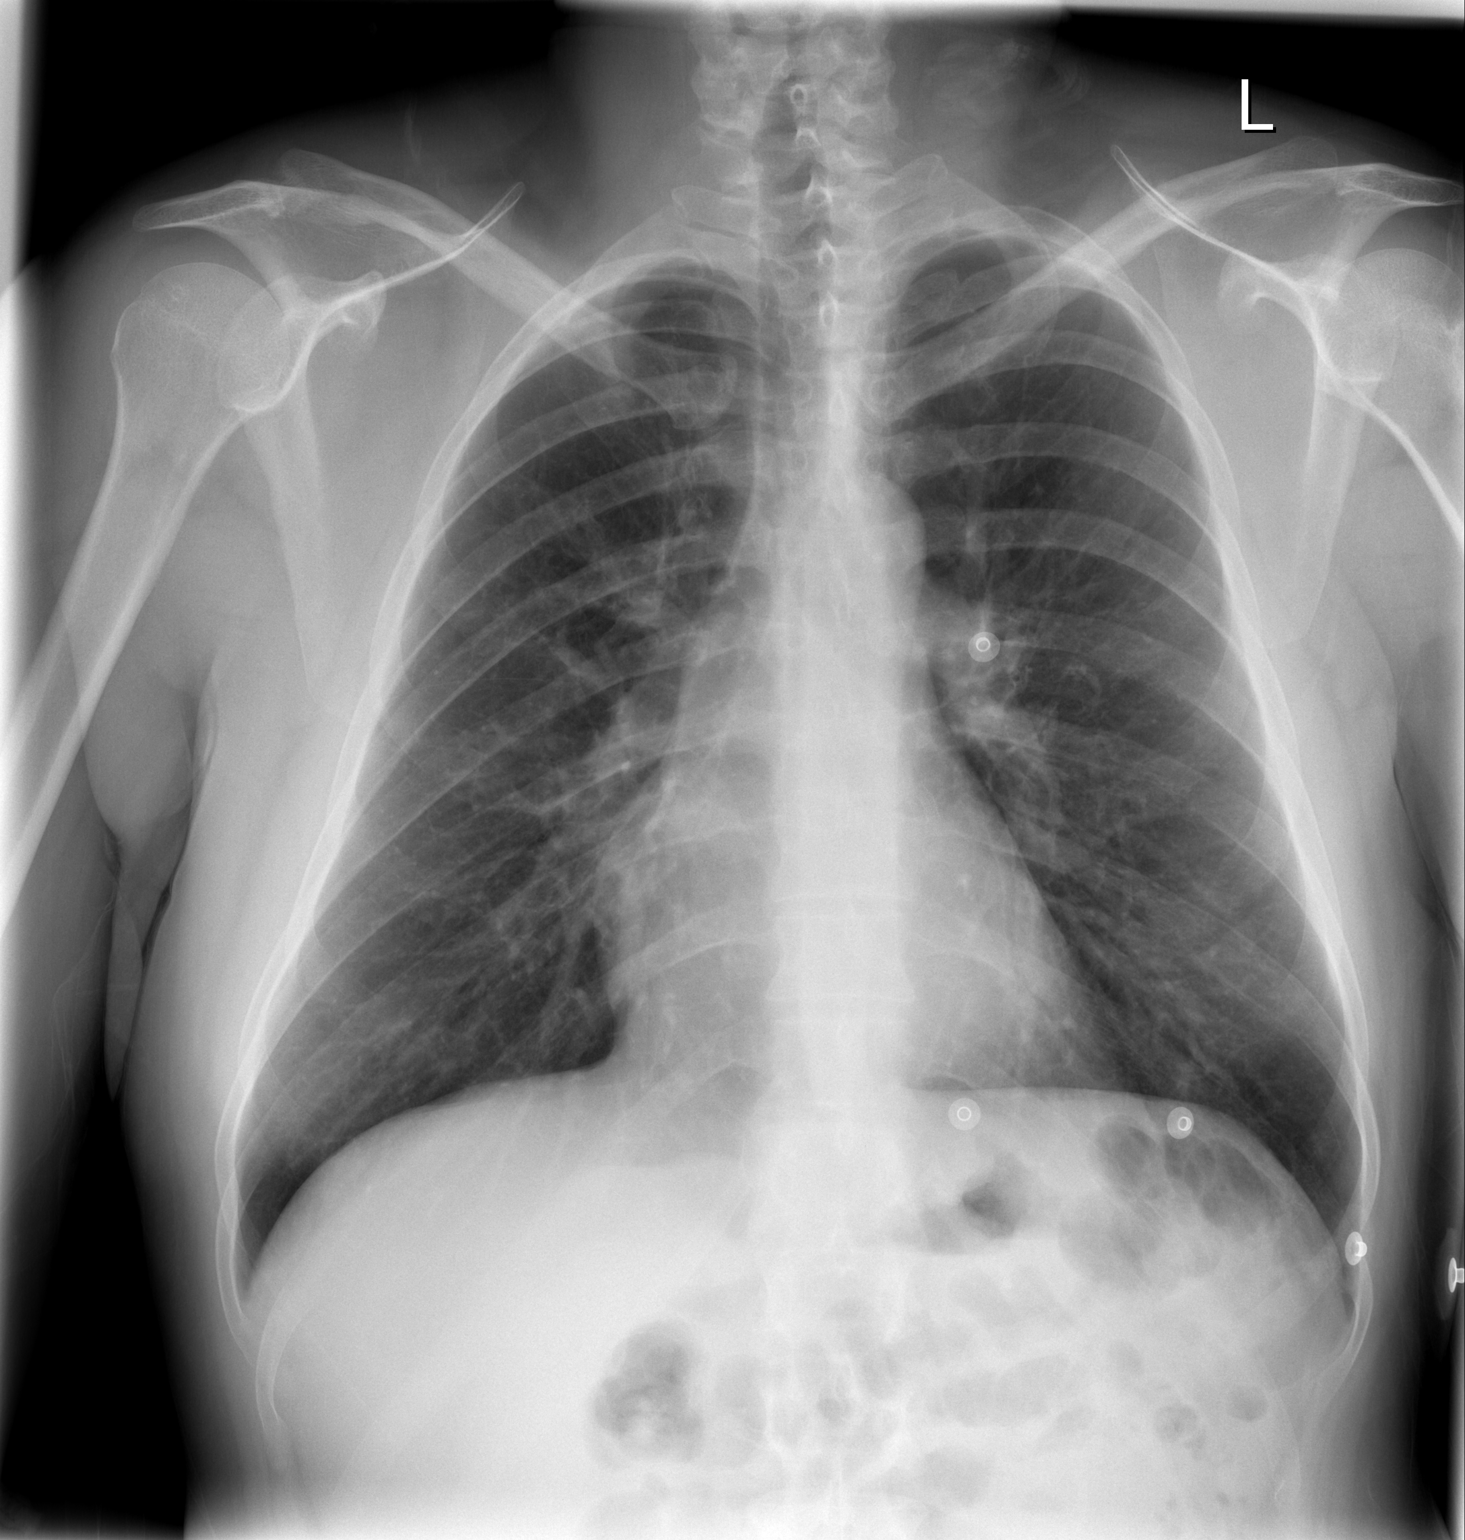

[w chest lat]
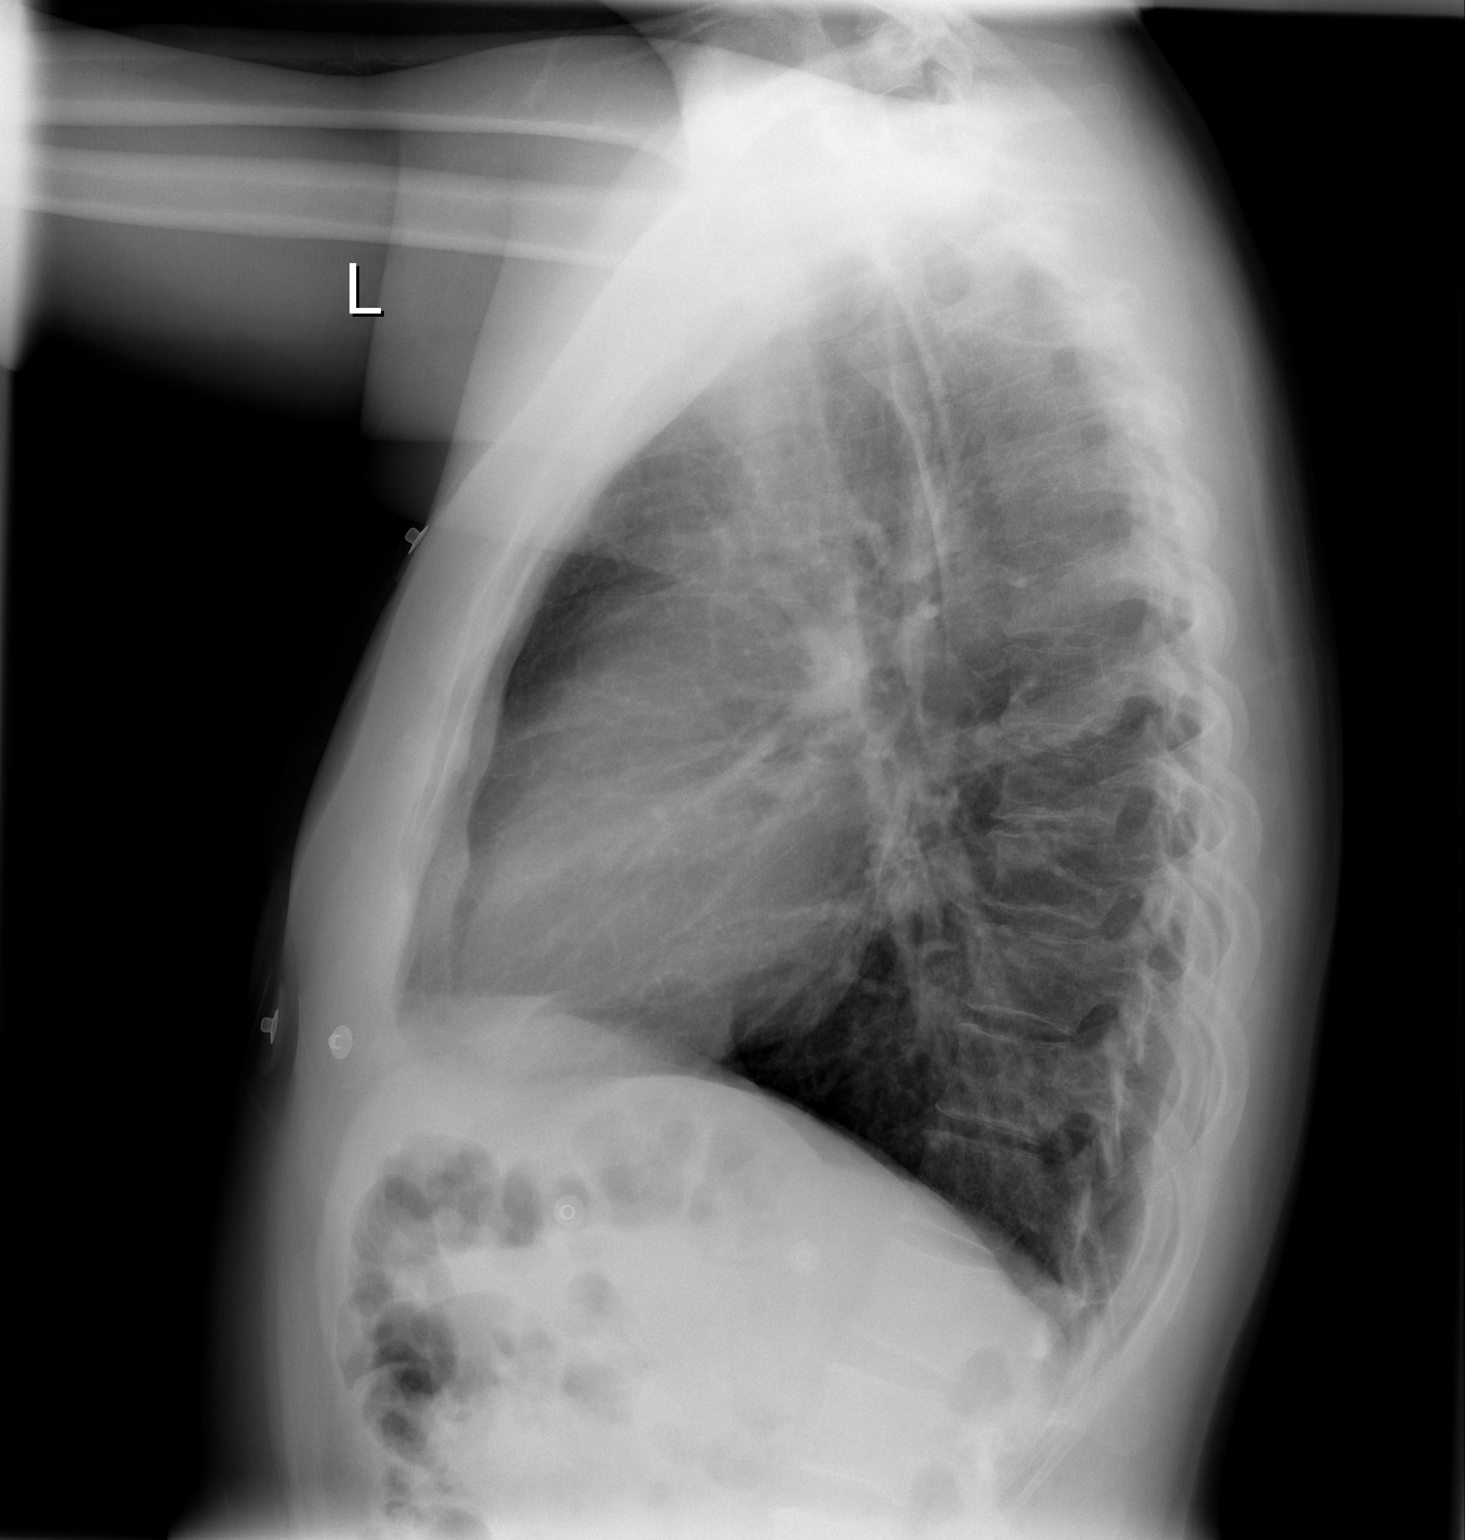

[2 of 2 positions shown; findings below may reference images not displayed]

FINDINGS: The lungs are clear without focal infiltrate, edema,
pneumothorax or pleural effusion. Interstitial markings are
diffusely coarsened with chronic features. The cardiopericardial
silhouette is within normal limits for size. Imaged bony structures
of the thorax are intact.
IMPRESSION: No acute cardiopulmonary findings.

## 2011-12-07 NOTE — ED Notes (Signed)
Pt arrived by ems for chest pain startign at 1500, took 2 nitro at home with no relief. cbg 142, iv started pta. Pt non complaint with most of his meds.

## 2011-12-07 NOTE — ED Notes (Signed)
Consulting MD at bedside

## 2011-12-07 NOTE — ED Notes (Signed)
EKG given to Dr. Wentz 

## 2011-12-07 NOTE — ED Notes (Signed)
Pt states previous MI, pt states that he had a sudden sharp pain in the center of his chest and through his back. Pt states pain similar to pain he had with MI. Pt denies SOB, N/V or diaphoresis. Pt states that he has been having dizziness as well. Pt states that he feels like the room is spinning and that he becomes disoriented. Pt alert and oriented and able to move all extremities and follow commands.

## 2011-12-07 NOTE — ED Provider Notes (Signed)
History     CSN: [id number]  Arrival date & time 12/07/11  1802   First MD Initiated Contact with Patient 12/07/11 1853     HPI Patient reports at 3 PM today acutely began to develop chest pain. Points to pain located across his upper chest port pain was constant despite taking 2 nitroglycerin. States when EMS arrived he was given sublingual nitroglycerin sprays into the nitroglycerin paste across his chest. States this finally improved the pain. Denies associated diaphoresis, nausea, shortness of breath. Does report some mild dizziness associated with this. Patient has a significant history of a stented LAD stent in February 2012 by Dr. Rose Fillers. States he has been noncompliant with his lisinopril to to lack money. Does admit to smoking marijuana 2 days ago. Denies cocaine abuse for several months. Reports pain feels similar to prior MI pain except for less severe. States in the past he has had mild episodes of chest pain that have resolved after rest.  Patient is a 40 y.o. male presenting with chest pain.  Chest Pain The chest pain began 6 - 12 hours ago. Chest pain occurs constantly. The chest pain is resolved. The severity of the pain is moderate. The quality of the pain is described as tightness. The pain does not radiate. Pertinent negatives for primary symptoms include no fever, no fatigue, no shortness of breath, no abdominal pain, no nausea, no vomiting and no dizziness.  Pertinent negatives for associated symptoms include no diaphoresis, no numbness and no weakness. He tried nitroglycerin for the symptoms. Risk factors include male gender and smoking/tobacco exposure.  His past medical history is significant for CAD and hypertension.     Past Medical History  Diagnosis Date  . CAD (coronary artery disease) 01/2011    S/P anterior STEMI  . Hypertension   . Cardiac arrest - ventricular fibrillation     In setting of STEMI  . Ischemic cardiomyopathy     EF 25-30% with apical and  anteroapical Akinesis  . Tobacco abuse     History reviewed. No pertinent past surgical history.  Family History  Problem Relation Age of Onset  . Coronary artery disease Other     History  Substance Use Topics  . Smoking status: Smoker, Current Status Unknown  . Smokeless tobacco: Not on file  . Alcohol Use: Not on file      Review of Systems  Constitutional: Negative for fever, chills, diaphoresis and fatigue.  HENT: Negative for sore throat.   Respiratory: Negative for shortness of breath.   Cardiovascular: Positive for chest pain.  Gastrointestinal: Negative for nausea, vomiting and abdominal pain.  Musculoskeletal: Negative for back pain.  Neurological: Negative for dizziness, weakness, numbness and headaches.  All other systems reviewed and are negative.    Allergies  Penicillins  Home Medications   Current Outpatient Rx  Name Route Sig Dispense Refill  . ASPIRIN 325 MG PO TABS Oral Take 325 mg by mouth daily.      Marland Kitchen LISINOPRIL 10 MG PO TABS Oral Take 5 mg by mouth daily.     Marland Kitchen NITROGLYCERIN 0.4 MG SL SUBL Sublingual Place 0.4 mg under the tongue every 5 (five) minutes as needed. For chest pain. May repeat for up to 3 doses.    Marland Kitchen PRASUGREL HCL 10 MG PO TABS Oral Take 10 mg by mouth daily.     . ASPIRIN 81 MG PO TBEC Oral Take 325 mg by mouth once.       BP 119/78  Pulse 69  Temp(Src) 97.9 F (36.6 C) (Oral)  Resp 15  SpO2 97%  Physical Exam  Constitutional: He is oriented to person, place, and time. He appears well-developed and well-nourished.  HENT:  Head: Normocephalic and atraumatic.  Eyes: Conjunctivae are normal. Pupils are equal, round, and reactive to light.  Neck: Normal range of motion. Neck supple.  Cardiovascular: Normal rate, regular rhythm and normal heart sounds.   Pulmonary/Chest: Effort normal and breath sounds normal.  Abdominal: Soft. Bowel sounds are normal.  Neurological: He is alert and oriented to person, place, and time.    Skin: Skin is warm and dry. No rash noted. No erythema. No pallor.  Psychiatric: He has a normal mood and affect. His behavior is normal.    ED Course  Procedures Results for orders placed during the hospital encounter of 12/07/11  CBC      Component Value Range   WBC 11.3 (*) 4.0 - 10.5 (K/uL)   RBC 4.43  4.22 - 5.81 (MIL/uL)   Hemoglobin 15.4  13.0 - 17.0 (g/dL)   HCT 16.1  09.6 - 04.5 (%)   MCV 95.7  78.0 - 100.0 (fL)   MCH 34.8 (*) 26.0 - 34.0 (pg)   MCHC 36.3 (*) 30.0 - 36.0 (g/dL)   RDW 40.9  81.1 - 91.4 (%)   Platelets 243  150 - 400 (K/uL)  DIFFERENTIAL      Component Value Range   Neutrophils Relative 71  43 - 77 (%)   Neutro Abs 7.9 (*) 1.7 - 7.7 (K/uL)   Lymphocytes Relative 22  12 - 46 (%)   Lymphs Abs 2.5  0.7 - 4.0 (K/uL)   Monocytes Relative 7  3 - 12 (%)   Monocytes Absolute 0.8  0.1 - 1.0 (K/uL)   Eosinophils Relative 1  0 - 5 (%)   Eosinophils Absolute 0.1  0.0 - 0.7 (K/uL)   Basophils Relative 0  0 - 1 (%)   Basophils Absolute 0.0  0.0 - 0.1 (K/uL)  COMPREHENSIVE METABOLIC PANEL      Component Value Range   Sodium 135  135 - 145 (mEq/L)   Potassium 4.2  3.5 - 5.1 (mEq/L)   Chloride 103  96 - 112 (mEq/L)   CO2 21  19 - 32 (mEq/L)   Glucose, Bld 93  70 - 99 (mg/dL)   BUN 4 (*) 6 - 23 (mg/dL)   Creatinine, Ser 7.82  0.50 - 1.35 (mg/dL)   Calcium 9.5  8.4 - 95.6 (mg/dL)   Total Protein 7.5  6.0 - 8.3 (g/dL)   Albumin 4.1  3.5 - 5.2 (g/dL)   AST 26  0 - 37 (U/L)   ALT 20  0 - 53 (U/L)   Alkaline Phosphatase 72  39 - 117 (U/L)   Total Bilirubin 0.4  0.3 - 1.2 (mg/dL)   GFR calc non Af Amer >90  >90 (mL/min)   GFR calc Af Amer >90  >90 (mL/min)  POCT I-STAT TROPONIN I      Component Value Range   Troponin i, poc 0.00  0.00 - 0.08 (ng/mL)   Comment 3            Dg Chest 2 View  12/07/2011  *RADIOLOGY REPORT*  Clinical Data: Chest pain and shortness of breath.  CHEST - 2 VIEW  Comparison: None.  Findings: The lungs are clear without focal infiltrate,  edema, pneumothorax or pleural effusion. Interstitial markings are diffusely coarsened with chronic features. The cardiopericardial  silhouette is within normal limits for size. Imaged bony structures of the thorax are intact.  IMPRESSION: No acute cardiopulmonary findings.  Original Report Authenticated By: ERIC A. MANSELL, M.D.    Date: 12/07/2011  Rate: 68  Rhythm: normal sinus rhythm  QRS Axis: normal  Intervals: normal  ST/T Wave abnormalities: normal  Conduction Disutrbances:none  Narrative Interpretation:   Old EKG Reviewed: No significant changes    MDM   9:32 PM Discussed labs x-ray and EKG with patient and family. Recommends admission for unstable angina. Patient however is absolutely refusing to be admitted. I spoke with Dr. Monica Martinez who will go see the patient as a consult. Discussed with cardiologist that I feel he should be admitted the patient does not want to this.    12:28 AM Dr. Maryelizabeth Kaufmann spoke with patient regarding chest pain. Patient is adamant on going home. States medical bills are too expensive. Now does admit to using cocaine 2 days ago. Cardiologist feels the chest pain is likely vasospasm but with his history should exercise extreme caution. Advised patient to return immediately to the ED for worsening chest pain. And feels patient may be discharged home. Patient has had neg troponin, labs, x-ray, EKG. I also feel patient may be discharged home. Patient was also to consult with cardiology. Discussed reasons for admission. Risk of leaving. Currently patient is asymptomatic and has been so for a least 6 hours   Thomasene Lot, [place] 12/08/11 [phone]

## 2011-12-08 NOTE — Discharge Instructions (Signed)
 Aspirin and Your Heart Aspirin affects the way your blood clots and helps "thin" the blood. Aspirin has many uses in heart disease. It may be used as a primary prevention to help reduce the risk of heart related events. It also can be used as a secondary measure to prevent more heart attacks or to prevent additional damage from blood clots.  ASPIRIN MAY HELP IF YOU:  Have had a heart attack or chest pain.   Have undergone open heart surgery such as CABG (Coronary Artery Bypass Surgery).   Have had coronary angioplasty with or without stents.   Have experienced a stroke or TIA (transient ischemic attack).   Have peripheral vascular disease (PAD).   Have chronic heart rhythm problems such as atrial fibrillation.   Are at risk for heart disease.  BEFORE STARTING ASPIRIN Before you start taking aspirin, your caregiver will need to review your medical history. Many things will need to be taken into consideration, such as:  Smoking status.   Blood pressure.   Diabetes.   Gender.   Weight.   Cholesterol level.  ASPIRIN DOSES  Aspirin should only be taken on the advice of your caregiver. Talk to your caregiver about how much aspirin you should take. Aspirin comes in different doses such as:   81 mg.   162 mg.   325 mg.   The aspirin dose you take may be affected by many factors, some of which include:   Your current medications, especially if your are taking blood-thinners or anti-platelet medicine.   Liver function.   Heart disease risk.   Age.   Aspirin comes in two forms:   Non-enteric-coated. This type of aspirin does not have a coating and is absorbed faster. Non-enteric coated aspirin is recommended for patients experiencing chest pain symptoms. This type of aspirin also comes in a chewable form.   Enteric-coated. This means the aspirin has a special coating that releases the medicine very slowly. Enteric-coated aspirin causes less stomach upset. This type of  aspirin should not be chewed or crushed.  ASPIRIN SIDE EFFECTS Daily use of aspirin can increase your risk of serious side effects, some of these include:  Increased bleeding. This can range from a cut that does not stop bleeding to more serious problems such as stomach bleeding or bleeding into the brain (Intracerebral bleeding).   Increased bruising.   Stomach upset.   An allergic reaction such as red, itchy skin.   Increased risk of bleeding when combined with non-steroidal anti-inflammatory medicine (NSAIDS).   Alcohol should be drank in moderation when taking aspirin. Alcohol can increase the risk of stomach bleeding when taken with aspirin.   Aspirin should not be given to children less than 72 years of age due to the association of Reye syndrome. Reye syndrome is a serious illness that can affect the brain and liver. Studies have linked Reye syndrome with aspirin use in children.   People that have nasal polyps have an increased risk of developing an aspirin allergy.  SEEK MEDICAL CARE IF:   You develop an allergic reaction such as:   Hives.   Itchy skin.   Swelling of the lips, tongue or face.   You develop stomach pain.   You have unusual bleeding or bruising.   You have ringing in your ears.  SEEK IMMEDIATE MEDICAL CARE IF:   You have severe chest pain, especially if the pain is crushing or pressure-like and spreads to the arms, back, neck, or jaw. THIS  IS AN EMERGENCY. Do not wait to see if the pain will go away. Get medical help at once. Call your local emergency services (911 in the [place] DO NOT drive yourself to the hospital.   You have stroke-like symptoms such as:   Loss of vision.   Difficulty talking.   Numbness or weakness on one side of your body.   Numbness or weakness in your arm or leg.   Not thinking clearly or feeling confused.   Your bowel movements are bloody, dark red or black in color.   You vomit or cough up blood.   You have blood  in your urine.   You have shortness of breath, coughing or wheezing.  MAKE SURE YOU:   Understand these instructions.   Will monitor your condition.   Seek immediate medical care if necessary.  Document Released: 11/14/2008 Document Revised: 08/14/2011 Document Reviewed: 11/14/2008 [facility] Patient Information 2012 [place], [place].    Chest Pain Observation It is often hard to give a specific diagnosis for the cause of chest pain. Your symptoms had a chance of being caused by inadequate oxygen delivery to your heart (angina). Angina that is not treated or evaluated can lead to a heart attack (myocardial infarction, MI) or death. Blood tests, electrocardiograms, and X-rays may have been done to help determine a possible cause of your chest pain. After evaluation and observation, your caregiver has determined that it is unlikely your pain was caused by angina. However, a full evaluation of your pain needs to be completed. You need to follow up with caregivers or diagnostic testing as directed. It is very important to keep your follow-up appointments. Not keeping your follow-up appointments could result in permanent heart damage, disability, or death. If there is any problem keeping your follow-up appointments, you must call your caregiver. HOME CARE INSTRUCTIONS  Due to the slight chance that your pain could be angina, it is important to follow healthy lifestyle habits and follow your caregiver's treatment plan:  Maintain a healthy weight.   Stay physically active and exercise regularly.   Decrease your salt intake.   Eat a diet low in saturated fats and cholesterol. Avoid foods fried in oil or made with fat. Talk to a dietician to learn about heart healthy foods.   Increase your fiber intake by including whole grains, vegetable, and fruits in your diet.   Avoid situations that cause stress, anger, or depression.   Take medication as advised by your caregiver. Report any side effects  to your caregiver. Do not stop medications or adjust the dosages on your own.   Quit smoking. Do not use nicotine patches or gum until you check with your caregiver.   Keep your blood pressure, blood sugar, and cholesterol levels within normal limits.   Limit alcohol intake to no more than 1 drink per day for nonpregnant women and 2 drinks per day for men.   Stop abusing drugs.  SEEK MEDICAL CARE IF: You have severe chest pain or pressure which may include symptoms such as:  Pain or pressure in the arms, neck, jaw, or back.   Profuse sweating.   Feeling sick to your stomach (nauseous).   Feeling short of breath while at rest.   Having a fast or irregular heartbeat.   You have chest pain that does not get better after rest or after taking your usual medicine.   You wake from sleep with chest pain.   You feel dizzy, faint, or experience extreme fatigue.  You notice increasing shortness of breath during rest, sleep, or with activity.   You are unable to sleep because you cannot breathe.   You develop a frequent cough or you are coughing up blood.   You have severe back or abdominal pain, are nauseated, or throw up (vomit).   You develop severe weakness, dizziness, fainting, or chills.  Any of these symptoms may represent a serious problem that is an emergency. Do not wait to see if the symptoms will go away. Call your local emergency services (911 in the [place] Do not drive yourself to the hospital. MAKE SURE YOU:  Understand these instructions.   Will watch your condition.   Will get help right away if you are not doing well or get worse.  Document Released: 01/04/2011 Document Revised: 08/14/2011 Document Reviewed: 01/04/2011 [facility] Patient Information 2012 [place], [place].Chest Pain Observation It is often hard to give a specific diagnosis for the cause of chest pain. Your symptoms had a chance of being caused by inadequate oxygen delivery to your heart (angina).  Angina that is not treated or evaluated can lead to a heart attack (myocardial infarction, MI) or death. Blood tests, electrocardiograms, and X-rays may have been done to help determine a possible cause of your chest pain. After evaluation and observation, your caregiver has determined that it is unlikely your pain was caused by angina. However, a full evaluation of your pain needs to be completed. You need to follow up with caregivers or diagnostic testing as directed. It is very important to keep your follow-up appointments. Not keeping your follow-up appointments could result in permanent heart damage, disability, or death. If there is any problem keeping your follow-up appointments, you must call your caregiver. HOME CARE INSTRUCTIONS  Due to the slight chance that your pain could be angina, it is important to follow healthy lifestyle habits and follow your caregiver's treatment plan:  Maintain a healthy weight.   Stay physically active and exercise regularly.   Decrease your salt intake.   Eat a diet low in saturated fats and cholesterol. Avoid foods fried in oil or made with fat. Talk to a dietician to learn about heart healthy foods.   Increase your fiber intake by including whole grains, vegetable, and fruits in your diet.   Avoid situations that cause stress, anger, or depression.   Take medication as advised by your caregiver. Report any side effects to your caregiver. Do not stop medications or adjust the dosages on your own.   Quit smoking. Do not use nicotine patches or gum until you check with your caregiver.   Keep your blood pressure, blood sugar, and cholesterol levels within normal limits.   Limit alcohol intake to no more than 1 drink per day for nonpregnant women and 2 drinks per day for men.   Stop abusing drugs.  SEEK MEDICAL CARE IF: You have severe chest pain or pressure which may include symptoms such as:  Pain or pressure in the arms, neck, jaw, or back.    Profuse sweating.   Feeling sick to your stomach (nauseous).   Feeling short of breath while at rest.   Having a fast or irregular heartbeat.   You have chest pain that does not get better after rest or after taking your usual medicine.   You wake from sleep with chest pain.   You feel dizzy, faint, or experience extreme fatigue.   You notice increasing shortness of breath during rest, sleep, or with activity.   You  are unable to sleep because you cannot breathe.   You develop a frequent cough or you are coughing up blood.   You have severe back or abdominal pain, are nauseated, or throw up (vomit).   You develop severe weakness, dizziness, fainting, or chills.  Any of these symptoms may represent a serious problem that is an emergency. Do not wait to see if the symptoms will go away. Call your local emergency services (911 in the [place] Do not drive yourself to the hospital. MAKE SURE YOU:  Understand these instructions.   Will watch your condition.   Will get help right away if you are not doing well or get worse.  Document Released: 01/04/2011 Document Revised: 08/14/2011 Document Reviewed: 01/04/2011 [facility] Patient Information 2012 [place], [place].

## 2011-12-08 NOTE — ED Provider Notes (Signed)
Medical screening examination/treatment/procedure(s) were performed by non-physician practitioner and as supervising physician I was immediately available for consultation/collaboration.  Flint Melter, MD 12/08/11 [phone]

## 2012-03-16 ENCOUNTER — Emergency Department: Payer: Self-pay | Admitting: Emergency Medicine

## 2012-03-16 LAB — URINALYSIS, COMPLETE
Bacteria: NONE SEEN
Bilirubin,UR: NEGATIVE
Glucose,UR: 50 mg/dL (ref 0–75)
Leukocyte Esterase: NEGATIVE
Nitrite: NEGATIVE
Ph: 6 (ref 4.5–8.0)
Protein: NEGATIVE
RBC,UR: 1 /HPF (ref 0–5)
Specific Gravity: 1.013 (ref 1.003–1.030)
Squamous Epithelial: <1
WBC UR: <1 /HPF (ref 0–5)

## 2012-04-06 ENCOUNTER — Emergency Department: Payer: Self-pay | Admitting: Emergency Medicine

## 2012-04-10 ENCOUNTER — Emergency Department: Payer: Self-pay | Admitting: Emergency Medicine

## 2012-10-22 ENCOUNTER — Emergency Department: Payer: Self-pay | Admitting: Emergency Medicine

## 2012-10-22 IMAGING — CR DG KNEE COMPLETE 4+V*L*
1 series · 4 of 4 positions shown · non-contrast
Comparison: none

REASON FOR EXAM: stepped wrong
COMMENTS:

[Series 1: t knee ap left · 0.14mm/px · 4 of 4 slices shown]
[im 1/4]
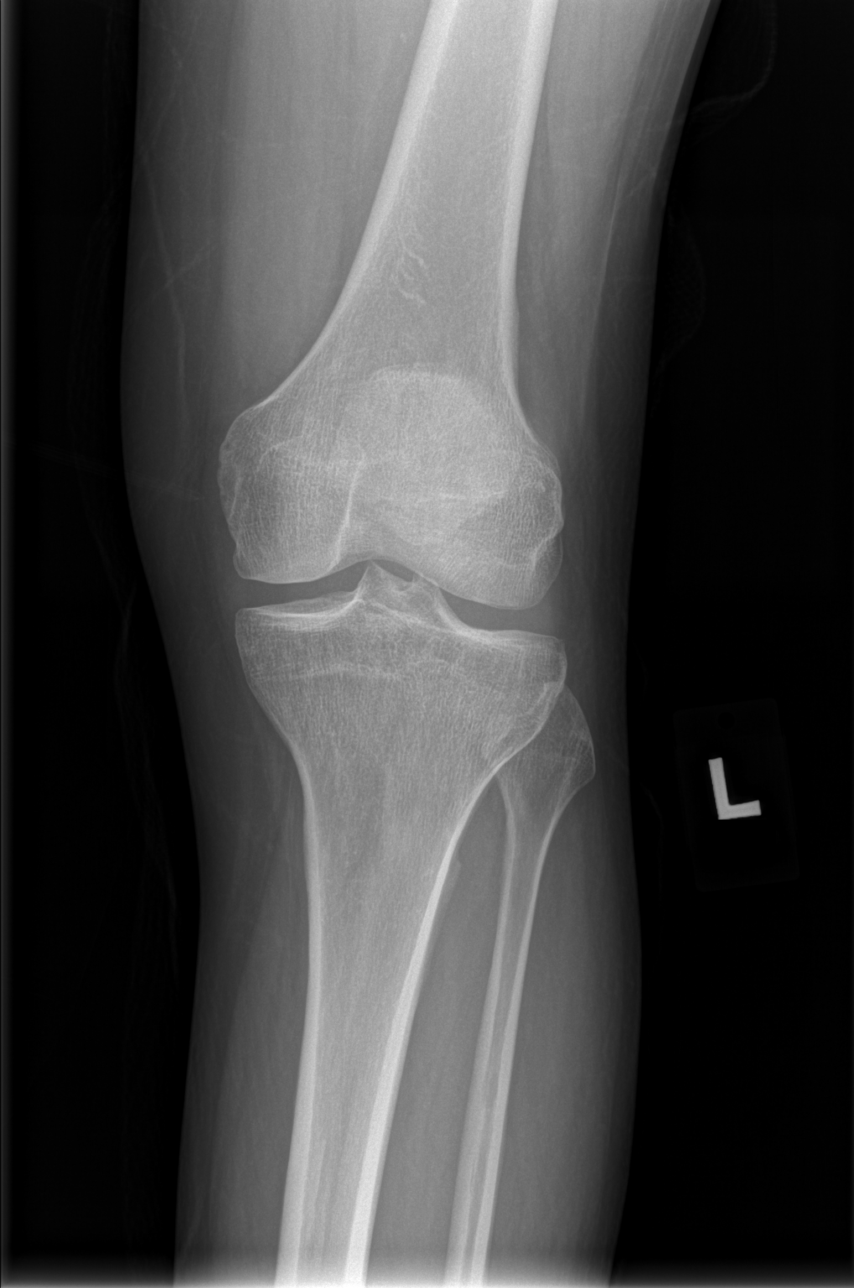
[im 2/4]
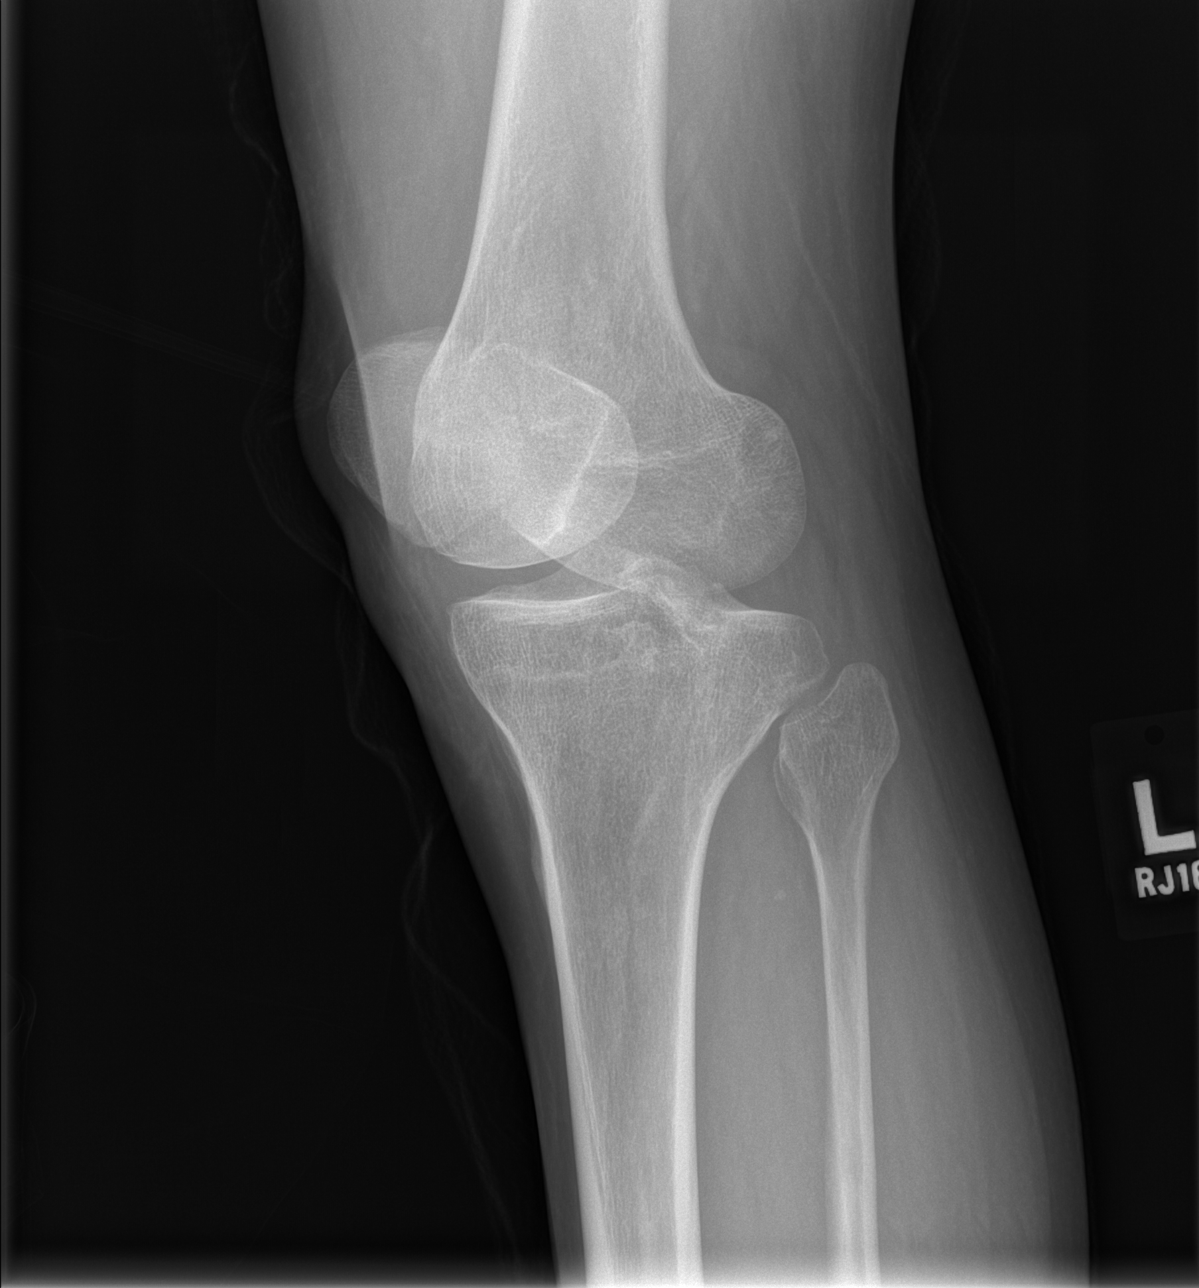
[im 3/4]
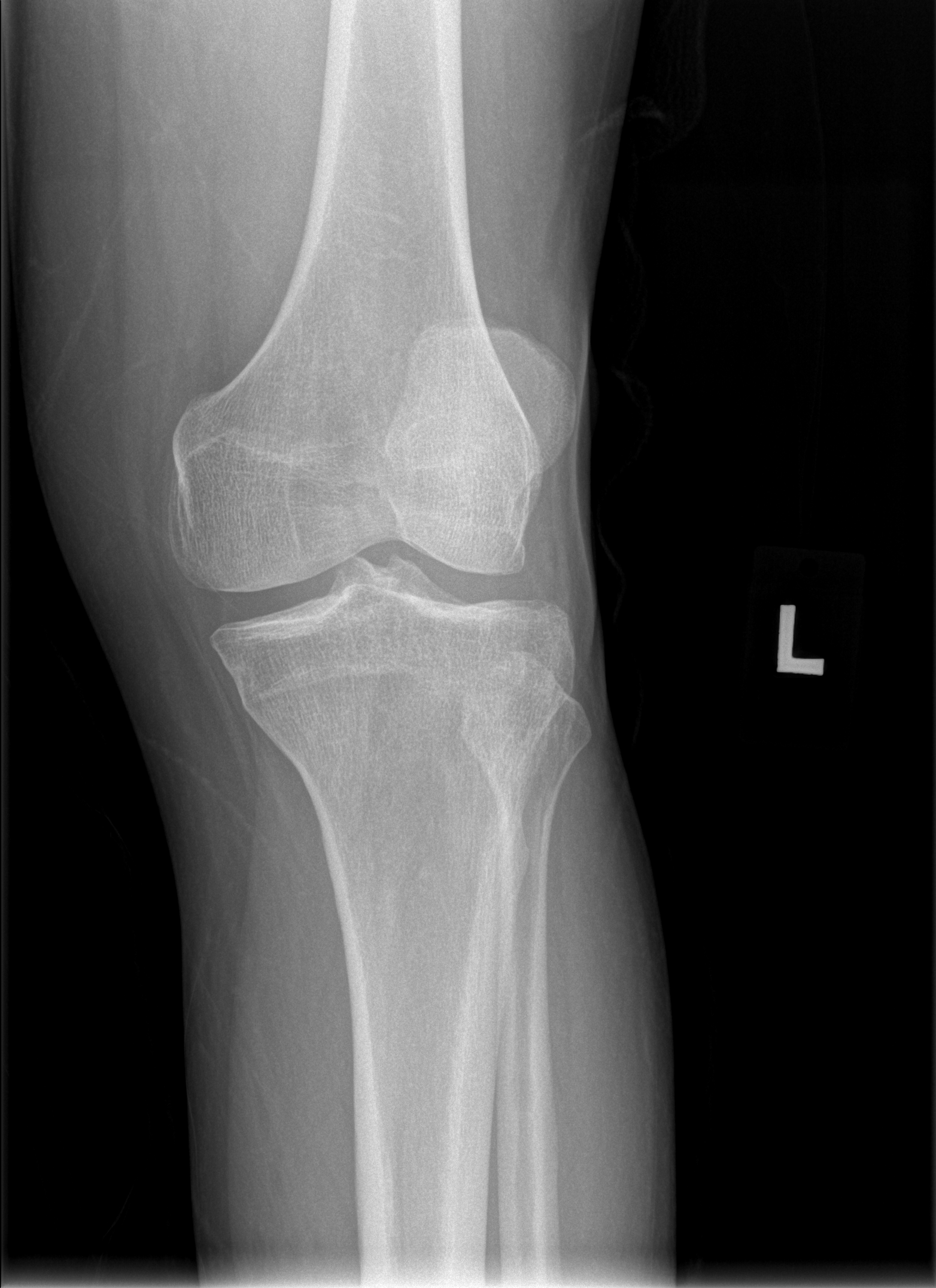
[im 4/4]
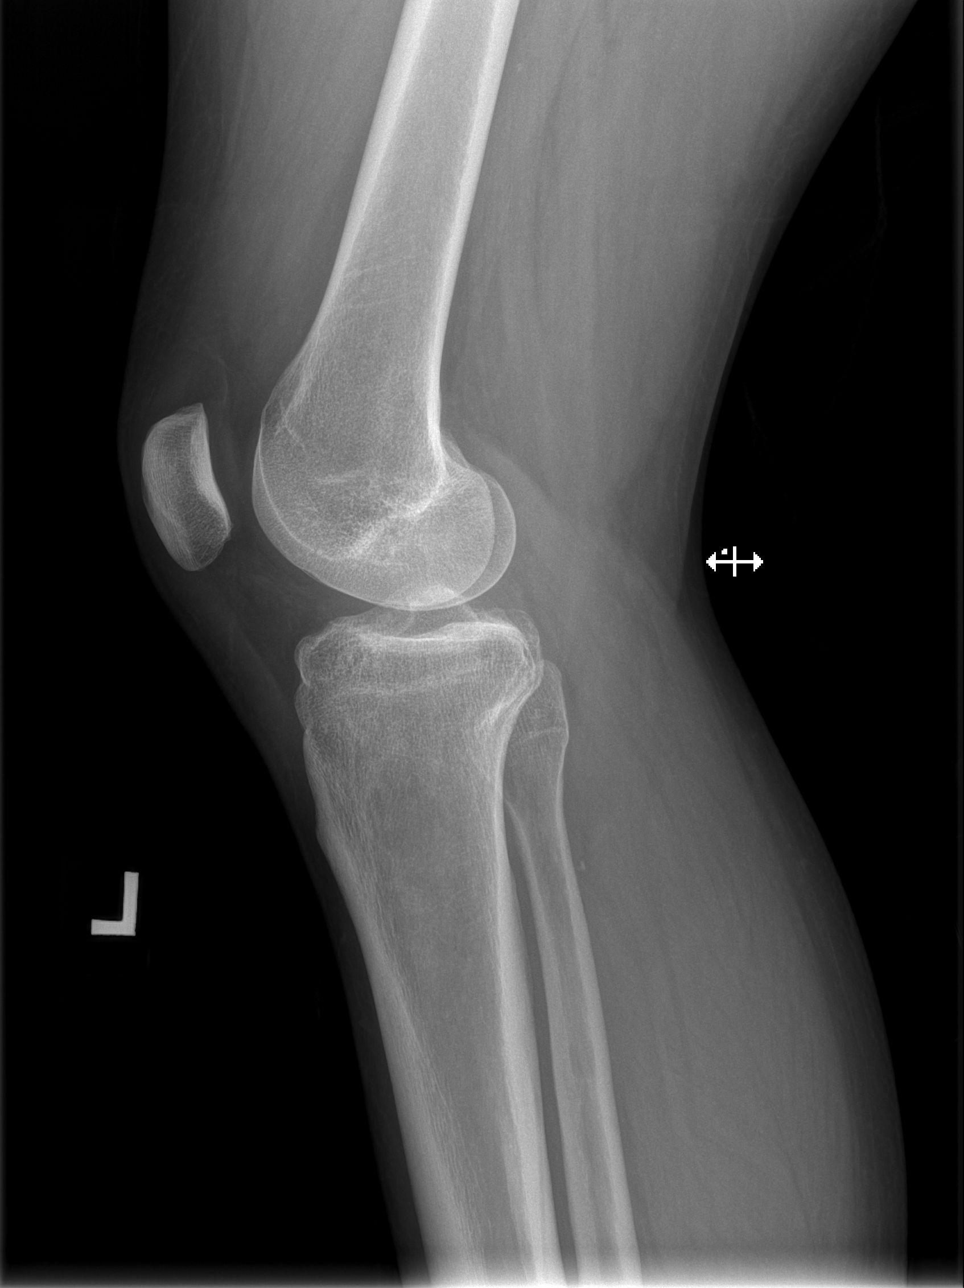

[4 of 4 positions shown; findings below may reference images not displayed]

PROCEDURE:     DXR - DXR KNEE LT COMP WITH OBLIQUES  - [DATE]  [DATE]

RESULT:     Four views of the left knee reveal the bones to be reasonably
well mineralized. There is no evidence of an acute fracture nor dislocation.
There is mild beaking of the tibial spines. No definite joint effusion is
demonstrated.
IMPRESSION: There is no evidence of an acute fracture nor dislocation.
Further evaluation with MRI may be useful if the patient's symptoms persist
and remain unexplained.

[REDACTED]

## 2012-11-16 ENCOUNTER — Encounter (HOSPITAL_COMMUNITY): Payer: Self-pay | Admitting: *Deleted

## 2012-11-16 ENCOUNTER — Observation Stay (HOSPITAL_COMMUNITY)
Admission: EM | Admit: 2012-11-16 | Discharge: 2012-11-17 | Disposition: A | Discharge status: Home / Self Care | Payer: 59 | Attending: Cardiovascular Disease | Admitting: Cardiovascular Disease

## 2012-11-16 ENCOUNTER — Emergency Department (HOSPITAL_COMMUNITY): Payer: Self-pay

## 2012-11-16 DIAGNOSIS — I10 Essential (primary) hypertension: Secondary | ICD-10-CM | POA: Insufficient documentation

## 2012-11-16 DIAGNOSIS — I2589 Other forms of chronic ischemic heart disease: Secondary | ICD-10-CM | POA: Insufficient documentation

## 2012-11-16 DIAGNOSIS — Z5900 Homelessness unspecified: Secondary | ICD-10-CM | POA: Insufficient documentation

## 2012-11-16 DIAGNOSIS — I25119 Atherosclerotic heart disease of native coronary artery with unspecified angina pectoris: Secondary | ICD-10-CM | POA: Diagnosis present

## 2012-11-16 DIAGNOSIS — R61 Generalized hyperhidrosis: Secondary | ICD-10-CM | POA: Insufficient documentation

## 2012-11-16 DIAGNOSIS — I200 Unstable angina: Secondary | ICD-10-CM

## 2012-11-16 DIAGNOSIS — I2510 Atherosclerotic heart disease of native coronary artery without angina pectoris: Secondary | ICD-10-CM | POA: Insufficient documentation

## 2012-11-16 DIAGNOSIS — Z720 Tobacco use: Secondary | ICD-10-CM | POA: Diagnosis present

## 2012-11-16 DIAGNOSIS — F1410 Cocaine abuse, uncomplicated: Secondary | ICD-10-CM | POA: Insufficient documentation

## 2012-11-16 DIAGNOSIS — R0789 Other chest pain: Secondary | ICD-10-CM | POA: Diagnosis present

## 2012-11-16 DIAGNOSIS — R079 Chest pain, unspecified: Principal | ICD-10-CM | POA: Insufficient documentation

## 2012-11-16 DIAGNOSIS — Z8674 Personal history of sudden cardiac arrest: Secondary | ICD-10-CM | POA: Insufficient documentation

## 2012-11-16 DIAGNOSIS — Z9861 Coronary angioplasty status: Secondary | ICD-10-CM | POA: Insufficient documentation

## 2012-11-16 DIAGNOSIS — F329 Major depressive disorder, single episode, unspecified: Secondary | ICD-10-CM | POA: Insufficient documentation

## 2012-11-16 DIAGNOSIS — F1010 Alcohol abuse, uncomplicated: Secondary | ICD-10-CM | POA: Insufficient documentation

## 2012-11-16 DIAGNOSIS — F1210 Cannabis abuse, uncomplicated: Secondary | ICD-10-CM | POA: Insufficient documentation

## 2012-11-16 DIAGNOSIS — F17200 Nicotine dependence, unspecified, uncomplicated: Secondary | ICD-10-CM | POA: Insufficient documentation

## 2012-11-16 HISTORY — DX: Alcohol abuse, uncomplicated: F10.10

## 2012-11-16 HISTORY — DX: Cannabis abuse, uncomplicated: F12.10

## 2012-11-16 HISTORY — DX: Cocaine abuse, uncomplicated: F14.10

## 2012-11-16 HISTORY — DX: Unspecified hemorrhoids: K64.9

## 2012-11-16 LAB — BASIC METABOLIC PANEL
BUN: 5 mg/dL — ABNORMAL LOW (ref 6–23)
CO2: 26 meq/L (ref 19–32)
Calcium: 10.1 mg/dL (ref 8.4–10.5)
Chloride: 104 mEq/L (ref 96–112)
Creatinine, Ser: 0.71 mg/dL (ref 0.50–1.35)
GFR calc Af Amer: >90 mL/min (ref >90)
GFR calc non Af Amer: >90 mL/min (ref >90)
Glucose, Bld: 97 mg/dL (ref 70–99)
Potassium: 4.3 mEq/L (ref 3.5–5.1)
Sodium: 140 meq/L (ref 135–145)

## 2012-11-16 LAB — CBC WITH DIFFERENTIAL/PLATELET
Basophils Absolute: 0 10*3/uL (ref 0.0–0.1)
Basophils Relative: 1 % (ref 0–1)
Eosinophils Absolute: 0.1 10*3/uL (ref 0.0–0.7)
Eosinophils Relative: 1 % (ref 0–5)
HCT: 45.2 % (ref 39.0–52.0)
Hemoglobin: 16.2 g/dL (ref 13.0–17.0)
Lymphocytes Relative: 26 % (ref 12–46)
Lymphs Abs: 2.3 10*3/uL (ref 0.7–4.0)
MCH: 35.2 pg — ABNORMAL HIGH (ref 26.0–34.0)
MCHC: 35.8 g/dL (ref 30.0–36.0)
MCV: 98.3 fL (ref 78.0–100.0)
Monocytes Absolute: 0.8 10*3/uL (ref 0.1–1.0)
Monocytes Relative: 9 % (ref 3–12)
Neutro Abs: 5.4 10*3/uL (ref 1.7–7.7)
Neutrophils Relative %: 63 % (ref 43–77)
Platelets: 269 10*3/uL (ref 150–400)
RBC: 4.6 MIL/uL (ref 4.22–5.81)
RDW: 12.5 % (ref 11.5–15.5)
WBC: 8.7 10*3/uL (ref 4.0–10.5)

## 2012-11-16 LAB — PROTIME-INR
INR: 0.87 (ref 0.00–1.49)
Prothrombin Time: 11.8 seconds (ref 11.6–15.2)

## 2012-11-16 LAB — APTT: aPTT: 29 seconds (ref 24–37)

## 2012-11-16 LAB — TROPONIN I
Troponin I: <0.3 ng/mL (ref <0.30)
Troponin I: <0.3 ng/mL (ref <0.30)
Troponin I: <0.3 ng/mL (ref <0.30)

## 2012-11-16 LAB — HEPARIN LEVEL (UNFRACTIONATED): Heparin Unfractionated: 0.2 [IU]/mL — ABNORMAL LOW (ref 0.30–0.70)

## 2012-11-16 LAB — MRSA PCR SCREENING: MRSA by PCR: NEGATIVE

## 2012-11-16 IMAGING — CR DG CHEST 1V PORT
1 series · 1 of 1 positions shown · non-contrast
Comparison: [DATE]

CLINICAL DATA: Chest pain

PORTABLE CHEST - 1 VIEW

[AP]
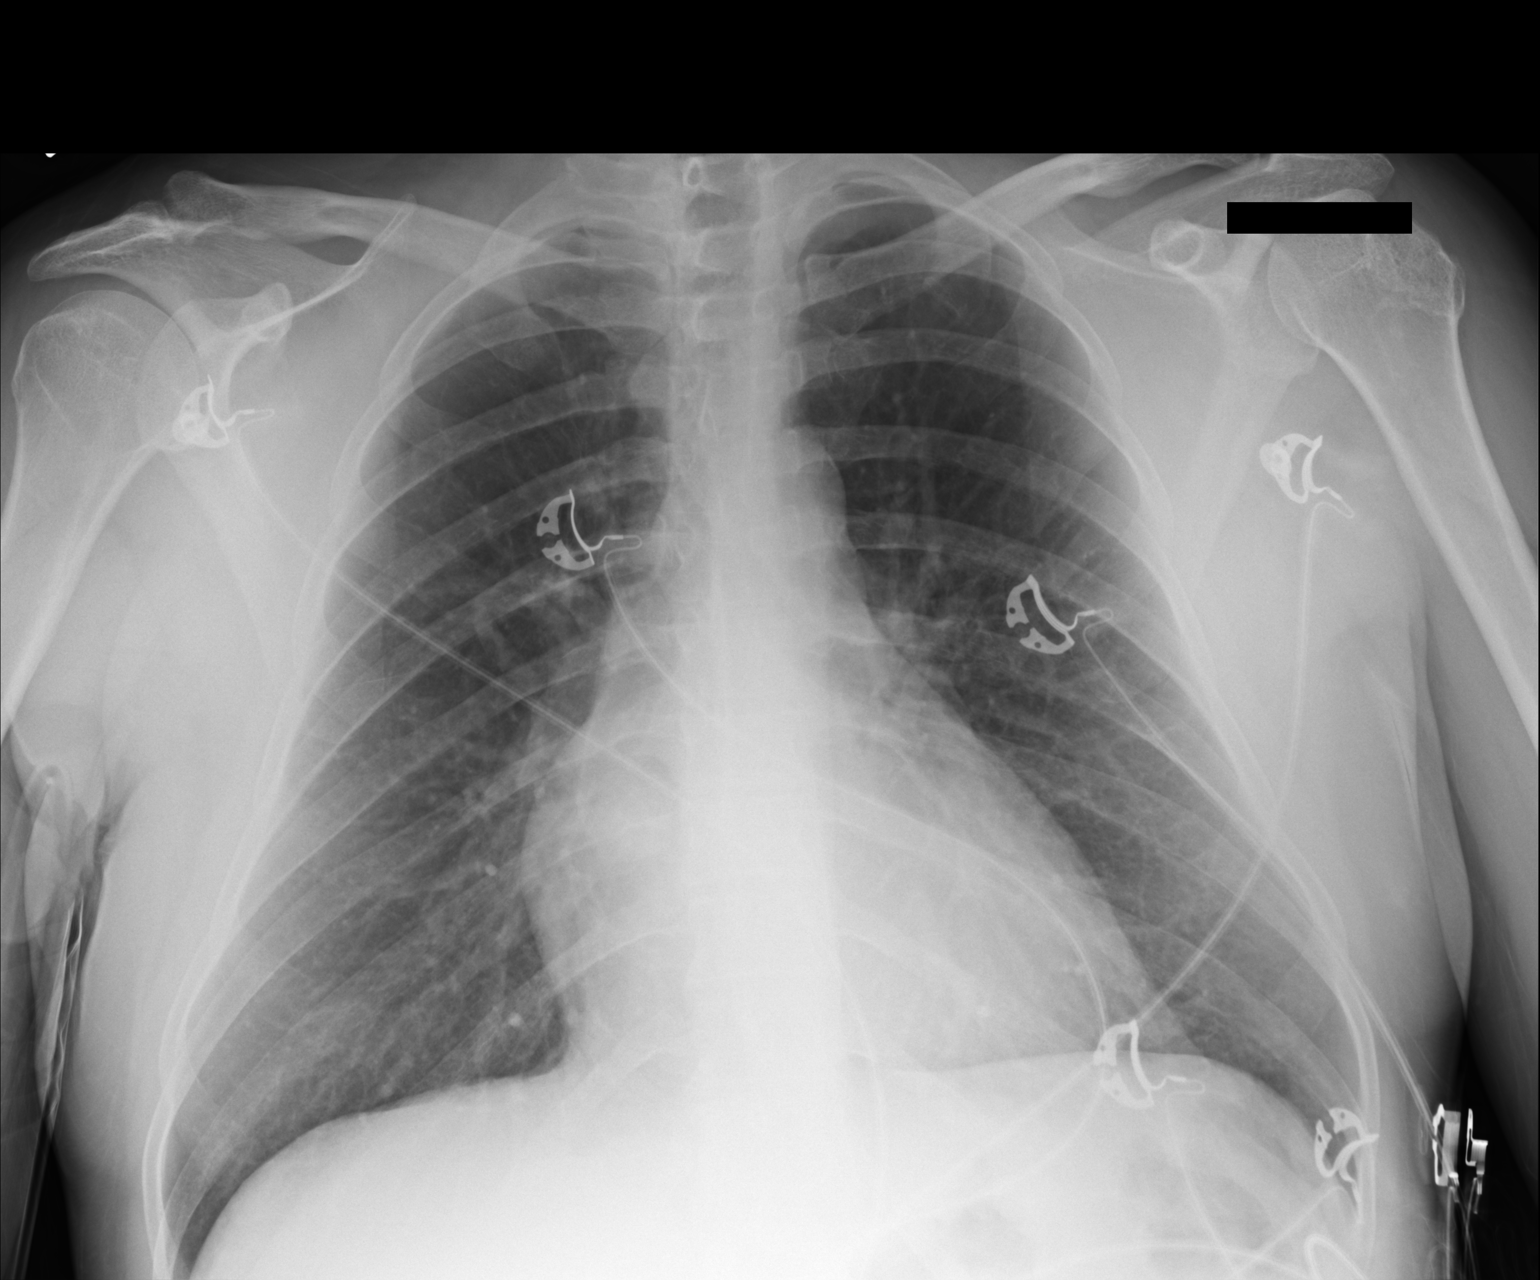

[1 of 1 positions shown; findings below may reference images not displayed]

FINDINGS: Cardiomediastinal silhouette is stable.  No acute
infiltrate or pleural effusion.  No pulmonary edema.  Bony thorax
is stable.
IMPRESSION: No active disease.

## 2012-11-16 MED ORDER — HEPARIN BOLUS VIA INFUSION
4000.0000 [IU] | Freq: Once | INTRAVENOUS | Status: AC
  Administered 2012-11-16: 4000 [IU] via INTRAVENOUS

## 2012-11-16 MED ORDER — ONDANSETRON HCL 4 MG/2ML IJ SOLN: 4.0000 mg | Freq: Four times a day (QID) | INTRAMUSCULAR | Status: DC | PRN

## 2012-11-16 MED ORDER — FOLIC ACID 1 MG PO TABS
1.0000 mg | ORAL_TABLET | Freq: Every day | ORAL | Status: DC
  Administered 2012-11-16 – 2012-11-17 (×2): 1 mg via ORAL
  Filled 2012-11-16 (×2): qty 1

## 2012-11-16 MED ORDER — NITROGLYCERIN IN D5W 200-5 MCG/ML-% IV SOLN
5.0000 ug/min | Freq: Once | INTRAVENOUS | Status: AC
  Administered 2012-11-16: 5 ug/min via INTRAVENOUS
  Filled 2012-11-16: qty 250

## 2012-11-16 MED ORDER — METOPROLOL TARTRATE 12.5 MG HALF TABLET
12.5000 mg | ORAL_TABLET | Freq: Two times a day (BID) | ORAL | Status: DC
  Administered 2012-11-16: 12.5 mg via ORAL
  Filled 2012-11-16 (×3): qty 1

## 2012-11-16 MED ORDER — ADULT MULTIVITAMIN W/MINERALS CH
1.0000 | ORAL_TABLET | Freq: Every day | ORAL | Status: DC
  Administered 2012-11-16 – 2012-11-17 (×2): 1 via ORAL
  Filled 2012-11-16 (×2): qty 1

## 2012-11-16 MED ORDER — ZOLPIDEM TARTRATE 5 MG PO TABS: 5.0000 mg | ORAL_TABLET | Freq: Every evening | ORAL | Status: DC | PRN

## 2012-11-16 MED ORDER — ACETAMINOPHEN 325 MG PO TABS: 650.0000 mg | ORAL_TABLET | ORAL | Status: DC | PRN

## 2012-11-16 MED ORDER — THIAMINE HCL 100 MG/ML IJ SOLN
100.0000 mg | Freq: Every day | INTRAMUSCULAR | Status: DC
  Filled 2012-11-16 (×2): qty 1

## 2012-11-16 MED ORDER — HEPARIN (PORCINE) IN NACL 100-0.45 UNIT/ML-% IJ SOLN
1000.0000 [IU]/h | INTRAMUSCULAR | Status: DC
  Administered 2012-11-16: 1000 [IU]/h via INTRAVENOUS
  Filled 2012-11-16: qty 250

## 2012-11-16 MED ORDER — SODIUM CHLORIDE 0.9 % IJ SOLN: 3.0000 mL | INTRAMUSCULAR | Status: DC | PRN

## 2012-11-16 MED ORDER — LORAZEPAM 1 MG PO TABS
1.0000 mg | ORAL_TABLET | Freq: Four times a day (QID) | ORAL | Status: DC | PRN
  Administered 2012-11-16 – 2012-11-17 (×4): 1 mg via ORAL
  Filled 2012-11-16 (×4): qty 1

## 2012-11-16 MED ORDER — SODIUM CHLORIDE 0.9 % IV SOLN: 250.0000 mL | INTRAVENOUS | Status: DC | PRN

## 2012-11-16 MED ORDER — ASPIRIN 325 MG PO TABS
325.0000 mg | ORAL_TABLET | Freq: Every day | ORAL | Status: DC
  Administered 2012-11-17: 325 mg via ORAL
  Filled 2012-11-16: qty 1

## 2012-11-16 MED ORDER — HEPARIN BOLUS VIA INFUSION
1000.0000 [IU] | Freq: Once | INTRAVENOUS | Status: AC
  Administered 2012-11-16: 1000 [IU] via INTRAVENOUS

## 2012-11-16 MED ORDER — VITAMIN B-1 100 MG PO TABS
100.0000 mg | ORAL_TABLET | Freq: Every day | ORAL | Status: DC
  Administered 2012-11-16 – 2012-11-17 (×2): 100 mg via ORAL
  Filled 2012-11-16 (×2): qty 1

## 2012-11-16 MED ORDER — SODIUM CHLORIDE 0.9 % IJ SOLN
3.0000 mL | Freq: Two times a day (BID) | INTRAMUSCULAR | Status: DC
  Administered 2012-11-16: 3 mL via INTRAVENOUS

## 2012-11-16 MED ORDER — NITROGLYCERIN 0.4 MG SL SUBL: 0.4000 mg | SUBLINGUAL_TABLET | SUBLINGUAL | Status: DC | PRN

## 2012-11-16 MED ORDER — LORAZEPAM 2 MG/ML IJ SOLN: 1.0000 mg | Freq: Four times a day (QID) | INTRAMUSCULAR | Status: DC | PRN

## 2012-11-16 MED ORDER — HEPARIN (PORCINE) IN NACL 100-0.45 UNIT/ML-% IJ SOLN
1450.0000 [IU]/h | INTRAMUSCULAR | Status: DC
  Administered 2012-11-16: 1150 [IU]/h via INTRAVENOUS
  Administered 2012-11-17: 1450 [IU]/h via INTRAVENOUS
  Filled 2012-11-16 (×3): qty 250

## 2012-11-16 NOTE — H&P (Signed)
Patient ID: Ethan Wells MRN: [id number], DOB/AGE: [birth year]   Admit date: 11/16/2012   Primary Physician: None Primary Cardiologist: C. Clifton James, MD  Pt. Profile:  41 y/o male with h/o CAD s/p BMS to the LAD in 01/2011 who presents with [place].  Problem List  Past Medical History  Diagnosis Date  . CAD (coronary artery disease)     a. 01/2011 Anterior STEMI/Cath/PCI: LM nl, LAD 100d (3.5x4mm Vision BMS placed), LCX 58m, RI 50, RCA min irregs, EF 40% w/ apical, inferoapical HK.  Marland Kitchen Hypertension   . Cardiac arrest - ventricular fibrillation     a. In setting of STEMI 01/2011  . Ischemic cardiomyopathy     a. 06/2011 Echo: EF 45-50%, No rwma  . Tobacco abuse     a. 1/2 ppd x 26 yrs  . ETOH abuse     a. 6-12 beers/day  . Hemorrhoids   . Cocaine abuse   . Marijuana abuse     History reviewed. No pertinent past surgical history.   Allergies  Allergies  Allergen Reactions  . Penicillins Hives   HPI  42 y/o male with the above problem list.  He is s/p MI in 01/2011 with PCI/BMS of the LAD @ that time.  He came off of effient in February of this year and has otherwise only been taking asa, as he cannot afford any of his other meds.  He continues to smoke 1/2 ppd, drink 6-12 beers/night, and uses cocaine recreationally (< 1x/month).  Since his MI, he says that he experiences some degree of chest pain and dyspnea with higher levels of activity and doesn't think that this has really changed much in the last 20 months or so.  He says that he stays fairly depressed and self-medicates.  He does not have a PCP.  He last used cocaine about 10 days ago.  He was otherwise in his usoh until this AM, when he awoke @ 7:30, with moderate to severe substernal chest discomfort moving to his right shoulder and associated with diaphoresis.  He took an asa and sl ntg without significant improvement and called EMS about 90 mins later.  EMS ECG was non-acute and he was taken to the [facility] ED.  Here,  troponins are nl x 2.  He continues to report mild retrosternal and epigastric discomfort associated with mild tenderness upon palpation.  He is not sure if current Ss are similar to prior Ss.  Home Medications  Prior to Admission medications   Medication Sig Start Date End Date Taking? Authorizing Provider  aspirin 325 MG tablet Take 325 mg by mouth daily.     Yes Historical Provider, MD  nitroGLYCERIN (NITROSTAT) 0.4 MG SL tablet Place 0.4 mg under the tongue every 5 (five) minutes as needed. For chest pain. May repeat for up to 3 doses.   Yes Historical Provider, MD   Family History  Family History  Problem Relation Age of Onset  . Coronary artery disease Mother     alive  . Other      2 sisters alive and well  . Alcohol abuse Father     died @ 76   Social History  History   Social History  . Marital Status: Single    Spouse Name: N/A    Number of Children: N/A  . Years of Education: N/A   Occupational History  . Not on file.   Social History Main Topics  . Smoking status: Smoker, Current Status Unknown -- 0.5  packs/day for 26 years  . Smokeless tobacco: Not on file  . Alcohol Use: Yes     Comment: at least a 6 pack of beer daily and often a 12 pack  . Drug Use: Yes    Special: Cocaine, Marijuana  ** HE WOULD PREFER THAT WE DO NOT DISCUSS THIS IN FRONT OF HIS MOTHER.**     Comment: used cocaine about 10 days ago.  Marland Kitchen Sexually Active: Not on file   Other Topics Concern  . Not on file   Social History Narrative   Single, lives in [place] with his mother.  He does not routinely exercise.  He sometimes works, helping to deliver appliances to homes.    Review of Systems General:  No chills, fever, or weight changes.  He did have diaphoresis associated with this morning Ss. Cardiovascular:  +++ chest pain as outlined above.  No dyspnea on exertion, edema, orthopnea, palpitations, paroxysmal nocturnal dyspnea. Dermatological: No rash, lesions/masses Respiratory: No  cough, dyspnea Urologic: No hematuria, dysuria Abdominal:   No nausea, vomiting, diarrhea, melena, or hematemesis.  He has occasionally noted blood on toilet paper and does have a h/o hemorrhoids. Neurologic:  No visual changes, wkns, changes in mental status. All other systems reviewed and are otherwise negative except as noted above.  Physical Exam  Blood pressure 109/66, pulse 65, temperature 97.7 F (36.5 C), temperature source Oral, resp. rate 22, height 5\' 5"  (1.651 m), weight 160 lb (72.576 kg), SpO2 96.00%.  General: Pleasant, NAD Psych: Flat affect. Neuro: Alert and oriented X 3. Moves all extremities spontaneously. HEENT: Normal  Neck: Supple without bruits or JVD. Lungs:  Resp regular and unlabored, faint expiratory wheezes. Heart: RRR no s3, s4, or murmurs. Abdomen: Soft.  He does have mild epigastric and chest wall tenderness.  Abd is  non-distended, BS + x 4.  Extremities: No clubbing, cyanosis or edema. DP/PT/Radials 2+ and equal bilaterally.  Labs   Basename 11/16/12 1458 11/16/12 1009  CKTOTAL -- --  CKMB -- --  TROPONINI <0.30 <0.30   Lab Results  Component Value Date   WBC 8.7 11/16/2012   HGB 16.2 11/16/2012   HCT 45.2 11/16/2012   MCV 98.3 11/16/2012   PLT 269 11/16/2012     Lab 11/16/12 1009  NA 140  K 4.3  CL 104  CO2 26  BUN 5*  CREATININE 0.71  CALCIUM 10.1  PROT --  BILITOT --  ALKPHOS --  ALT --  AST --  GLUCOSE 97    Radiology/Studies  Dg Chest Portable 1 View  11/16/2012  *RADIOLOGY REPORT*  Clinical Data: Chest pain  PORTABLE CHEST - 1 VIEW  Comparison: 12/07/2011  Findings: Cardiomediastinal silhouette is stable.  No acute infiltrate or pleural effusion.  No pulmonary edema.  Bony thorax is stable.  IMPRESSION: No active disease.   Original Report Authenticated By: Natasha Mead, M.D.    ECG  RSR, 76, no acute st/t changes.  ASSESSMENT AND PLAN  1.  [place]/CAD:  Pt presented this AM with chest pain that has been only partially nitrate  responsive.  He is currently on heparin and ntg with mild, residual discomfort.  Chest pain is somewhat reproducible with palpation though he can't be sure that the tenderness is the same thing as the discomfort that brought him to the ED.  ECG is non-acute and troponin's are negative so far, despite 9 hrs of ongoing Ss.  Will place in obs, cycle CE, cont heparin and ntg for now.  Add asa, statin, low-dose bb.  If CE negative, will likely plan on cardiolite in AM.  If CE positive -> Cath.  2.  Depression:  Undiagnosed however pt reports feeling depressed all the time and staying to himself.  He says that his substance abuse is a way of self-medicating.  He does not have a PCP.  Will ask Case Mgmt for assistance in identifying outpt primary and psychiatry care for him in the [place] area.  Consider inpt psych consult.  3.  Polysubstance Abuse:  Advised complete cessation.  Will add CIWA protocol and nicotine patch while hospitalized.  Will ask Social Work to assist in identifying outpatient substance abuse resources in [place].  4.  ICM:  Last EF 45-50% 06/2011.  Euvolemic on exam.  Signed, Nicolasa Ducking, NP 11/16/2012, 4:11 PM  Patient seen and examined and discussed with C. Berge.  Has onset of chest pain earlier today with some bilateral tenderness.  He has not taken any of his meds since discharge due to cost, and also has ongoing issues with polysubstance abuse, with most recent cocaine use socially with friends on Wed or Thursday of last week.  He had prolonged chest pain and currently is maintained on IV UFH and NTG.  He has modest reduction in LV function.  He does admit to dysphoric mood, and thus has ongoing issues.  Exam is unremarkable.  ECG with no acute changes, nor dynamic ST or T wave change.  Troponin times two are negative for MI.    Plan:  1.  Observation status 2.  Have Dr. Clifton James see in am to determine if cath or conservative management, or GXT testing.  3.  Social  work and care management consults. 4.  Add low dose beta blockers back 5.  Encourage DC of ETOH and cocaine.

## 2012-11-16 NOTE — ED Provider Notes (Signed)
History     CSN: [id number]  Arrival date & time 11/16/12  4098   First MD Initiated Contact with Patient 11/16/12 [phone]      Chief Complaint  Patient presents with  . Chest Pain    (Consider location/radiation/quality/duration/timing/severity/associated sxs/prior treatment) HPI 41 year old male with history of myocardial infarction and coronary stent was awakened by dull, achy left anterior chest pain with some radiation to the back. Pain is moderately severe and he rates it at 8/10. There is no associated nausea, vomiting, dyspnea. He was diaphoretic when he woke up. He took one dose of nitroglycerin with no relief. He did not get a headache from it but it did burn under his tongue. The nitroglycerin bottle is approximately 5 months old. He thinks that this pain is similar to what he had with his heart attacks. It is not affected by breathing or by movement. Nothing makes it better nothing makes it worse. He did take a dose of aspirin 325 mg prior to coming to the emergency department. He had been on Effient following his stent placement, but states he has not taken it in at least 6 months because his cardiologist told him he would only have to take it for a year. He has chronic dyspnea on exertion which has not changed recently. Past Medical History  Diagnosis Date  . CAD (coronary artery disease) 01/2011    S/P anterior STEMI  . Hypertension   . Cardiac arrest - ventricular fibrillation     In setting of STEMI  . Ischemic cardiomyopathy     EF 25-30% with apical and anteroapical Akinesis  . Tobacco abuse     History reviewed. No pertinent past surgical history.  Family History  Problem Relation Age of Onset  . Coronary artery disease Other     History  Substance Use Topics  . Smoking status: Smoker, Current Status Unknown  . Smokeless tobacco: Not on file  . Alcohol Use: Not on file      Review of Systems  Allergies  Penicillins  Home Medications   Current  Outpatient Rx  Name  Route  Sig  Dispense  Refill  . ASPIRIN 325 MG PO TABS   Oral   Take 325 mg by mouth daily.           Marland Kitchen NITROGLYCERIN 0.4 MG SL SUBL   Sublingual   Place 0.4 mg under the tongue every 5 (five) minutes as needed. For chest pain. May repeat for up to 3 doses.           BP 126/89  Pulse 72  Temp 97.7 F (36.5 C) (Oral)  Resp 16  SpO2 99%  Physical Exam 41 year old male, resting comfortably and in no acute distress. Vital signs are normal. Oxygen saturation is 99%, which is normal. Head is normocephalic and atraumatic. PERRLA, EOMI. Oropharynx is clear. Neck is nontender and supple without adenopathy or JVD. Back is nontender and there is no CVA tenderness. Lungs are clear without rales, wheezes, or rhonchi. Chest is nontender. Heart has regular rate and rhythm without murmur. Abdomen is soft, flat, nontender without masses or hepatosplenomegaly and peristalsis is normoactive. Extremities have no cyanosis or edema, full range of motion is present. Skin is warm and dry without rash. Neurologic: Mental status is normal, cranial nerves are intact, there are no motor or sensory deficits.  ED Course  Procedures (including critical care time)  Results for orders placed during the hospital encounter of 11/16/12  CBC  WITH DIFFERENTIAL      Component Value Range   WBC 8.7  4.0 - 10.5 K/uL   RBC 4.60  4.22 - 5.81 MIL/uL   Hemoglobin 16.2  13.0 - 17.0 g/dL   HCT 16.1  09.6 - 04.5 %   MCV 98.3  78.0 - 100.0 fL   MCH 35.2 (*) 26.0 - 34.0 pg   MCHC 35.8  30.0 - 36.0 g/dL   RDW 40.9  81.1 - 91.4 %   Platelets 269  150 - 400 K/uL   Neutrophils Relative 63  43 - 77 %   Neutro Abs 5.4  1.7 - 7.7 K/uL   Lymphocytes Relative 26  12 - 46 %   Lymphs Abs 2.3  0.7 - 4.0 K/uL   Monocytes Relative 9  3 - 12 %   Monocytes Absolute 0.8  0.1 - 1.0 K/uL   Eosinophils Relative 1  0 - 5 %   Eosinophils Absolute 0.1  0.0 - 0.7 K/uL   Basophils Relative 1  0 - 1 %    Basophils Absolute 0.0  0.0 - 0.1 K/uL  BASIC METABOLIC PANEL      Component Value Range   Sodium 140  135 - 145 mEq/L   Potassium 4.3  3.5 - 5.1 mEq/L   Chloride 104  96 - 112 mEq/L   CO2 26  19 - 32 mEq/L   Glucose, Bld 97  70 - 99 mg/dL   BUN 5 (*) 6 - 23 mg/dL   Creatinine, Ser 7.82  0.50 - 1.35 mg/dL   Calcium 95.6  8.4 - 21.3 mg/dL   GFR calc non Af Amer >90  >90 mL/min   GFR calc Af Amer >90  >90 mL/min  PROTIME-INR      Component Value Range   Prothrombin Time 11.8  11.6 - 15.2 seconds   INR 0.87  0.00 - 1.49  APTT      Component Value Range   aPTT 29  24 - 37 seconds  TROPONIN I      Component Value Range   Troponin I <0.30  <0.30 ng/mL   Dg Chest Portable 1 View  11/16/2012  *RADIOLOGY REPORT*  Clinical Data: Chest pain  PORTABLE CHEST - 1 VIEW  Comparison: 12/07/2011  Findings: Cardiomediastinal silhouette is stable.  No acute infiltrate or pleural effusion.  No pulmonary edema.  Bony thorax is stable.  IMPRESSION: No active disease.   Original Report Authenticated By: Natasha Mead, M.D.      ECG shows normal sinus rhythm with a rate of 76, no ectopy. Normal axis. Normal P wave. Normal QRS. Normal intervals. Normal ST and T waves. Impression: normal ECG. When compared with ECG of 12/07/2011, no significant changes are seen to   1. Unstable angina    CRITICAL CARE Performed by: YQMVH,QIONG   Total critical care time: 35 minutes  Critical care time was exclusive of separately billable procedures and treating other patients.  Critical care was necessary to treat or prevent imminent or life-threatening deterioration.  Critical care was time spent personally by me on the following activities: development of treatment plan with patient and/or surrogate as well as nursing, discussions with consultants, evaluation of patient's response to treatment, examination of patient, obtaining history from patient or surrogate, ordering and performing treatments and interventions,  ordering and review of laboratory studies, ordering and review of radiographic studies, pulse oximetry and re-evaluation of patient's condition.    MDM  Chest pain of in patient  with known coronary artery disease and stent placement. The fact that pain woke him up would make this pain unstable angina even if he does not have any troponin leak or ECG changes. Old records were reviewed, and he had been evaluated for chest pain in the emergency department one year ago at which time admission was recommended and he refused. Patient states that he is willing to be admitted this time. Also noted on past history is that he has ischemic cardiomyopathy with ejection fraction of 20-25%. Stent placement was a bare-metal stent. He will be treated for acute coronary syndrome/unstable angina. He will be given nitroglycerin and was started on heparin.  Following the above noted treatment, and he is noted to be sleeping. Cardiology consultation has been requested and he is to be seen and evaluated by the cardiologist in the emergency department.      Dione Booze, MD 11/16/12 1450

## 2012-11-16 NOTE — ED Notes (Signed)
Patient states chest pain starting this am at approx 0730, patient states generalized chest pain radiating to left shoulder, patient with history of cardiac stent in 2012, patient with ASA 325 mg and nitro x 1 dose pta

## 2012-11-16 NOTE — Progress Notes (Signed)
ANTICOAGULATION CONSULT NOTE - Initial Consult  Pharmacy Consult for Heparin Indication: chest pain/ACS  Allergies  Allergen Reactions  . Penicillins Hives    Patient Measurements: Height: 5\' 5"  (165.1 cm) Weight: 160 lb (72.576 kg) IBW/kg (Calculated) : 61.5   Vital Signs: Temp: 97.7 F (36.5 C) (12/02 0937) Temp src: Oral (12/02 0937) BP: 126/89 mmHg (12/02 0945) Pulse Rate: 72  (12/02 0945)  Labs: No results found for this basename: HGB:2,HCT:3,PLT:3,APTT:3,LABPROT:3,INR:3,HEPARINUNFRC:3,CREATININE:3,CKTOTAL:3,CKMB:3,TROPONINI:3 in the last 72 hours  Estimated Creatinine Clearance: 106.8 ml/min (by C-G formula based on Cr of 0.66).   Medical History: Past Medical History  Diagnosis Date  . CAD (coronary artery disease) 01/2011    S/P anterior STEMI  . Hypertension   . Cardiac arrest - ventricular fibrillation     In setting of STEMI  . Ischemic cardiomyopathy     EF 25-30% with apical and anteroapical Akinesis  . Tobacco abuse     Medications:  Infusions:    Assessment: Chest Pain:  41 year old male presenting with chest pain who has a history of MI and cardiac stent.  He is to begin anticoagulation with Heparin.  No contraindications noted.  Goal of Therapy:  Heparin level 0.3-0.7 units/ml Monitor platelets by anticoagulation protocol: Yes   Plan:  Heparin 4000 units IV bolus x 1 Start Heparin infusion at 1000 units/hr (~14 units/kg/hr) Check Heparin level 6 hours after starting heparin Daily Heparin level and CBC Follow-up baseline coag studies  Madolyn Frieze 11/16/2012,10:32 AM

## 2012-11-16 NOTE — Progress Notes (Signed)
ANTICOAGULATION CONSULT NOTE - Initial Consult  Pharmacy Consult for Heparin Indication: chest pain/ACS  Allergies  Allergen Reactions  . Penicillins Hives    Patient Measurements: Height: 5\' 5"  (165.1 cm) Weight: 160 lb (72.576 kg) IBW/kg (Calculated) : 61.5   Vital Signs: Temp: 97.7 F (36.5 C) (12/02 0937) Temp src: Oral (12/02 0937) BP: 119/70 mmHg (12/02 1745) Pulse Rate: 65  (12/02 1745)  Labs:  Basename 11/16/12 1708 11/16/12 1458 11/16/12 1009  HGB -- -- 16.2  HCT -- -- 45.2  PLT -- -- 269  APTT -- -- 29  LABPROT -- -- 11.8  INR -- -- 0.87  HEPARINUNFRC 0.20* -- --  CREATININE -- -- 0.71  CKTOTAL -- -- --  CKMB -- -- --  TROPONINI -- <0.30 <0.30    Estimated Creatinine Clearance: 106.8 ml/min (by C-G formula based on Cr of 0.71).   Medical History: Past Medical History  Diagnosis Date  . CAD (coronary artery disease)     a. 01/2011 Anterior STEMI/Cath/PCI: LM nl, LAD 100d (3.5x21mm Vision BMS placed), LCX 78m, RI 50, RCA min irregs, EF 40% w/ apical, inferoapical HK.  Marland Kitchen Hypertension   . Cardiac arrest - ventricular fibrillation     a. In setting of STEMI 01/2011  . Ischemic cardiomyopathy     a. 06/2011 Echo: EF 45-50%, No rwma  . Tobacco abuse     a. 1/2 ppd x 26 yrs  . ETOH abuse     a. 6-12 beers/day  . Hemorrhoids   . Cocaine abuse   . Marijuana abuse     Medications:  Infusions:     . heparin 1,000 Units/hr (11/16/12 1045)  . [COMPLETED] heparin Stopped (11/16/12 1050)    Assessment: Chest Pain:  41 year old male presenting with chest pain who has a history of MI and cardiac stent, on heparin.  No bleeding noted.  Heparin level is subtherapeutic.   Goal of Therapy:  Heparin level 0.3-0.7 units/ml Monitor platelets by anticoagulation protocol: Yes   Plan:  Bolus Heparin 1000 units x1, then increase heparin infusion to 1150 units/hr.  Check Heparin level 6 hours after rate change.   Daily Heparin level and CBC  Wendie Simmer,  PharmD, BCPS Clinical Pharmacist  Pager: [phone]

## 2012-11-17 ENCOUNTER — Observation Stay (HOSPITAL_COMMUNITY): Payer: Self-pay

## 2012-11-17 DIAGNOSIS — F1210 Cannabis abuse, uncomplicated: Secondary | ICD-10-CM | POA: Diagnosis present

## 2012-11-17 DIAGNOSIS — R079 Chest pain, unspecified: Secondary | ICD-10-CM

## 2012-11-17 DIAGNOSIS — I2510 Atherosclerotic heart disease of native coronary artery without angina pectoris: Secondary | ICD-10-CM

## 2012-11-17 DIAGNOSIS — R072 Precordial pain: Secondary | ICD-10-CM

## 2012-11-17 DIAGNOSIS — R0789 Other chest pain: Secondary | ICD-10-CM | POA: Diagnosis present

## 2012-11-17 DIAGNOSIS — F1410 Cocaine abuse, uncomplicated: Secondary | ICD-10-CM | POA: Diagnosis present

## 2012-11-17 LAB — CBC
HCT: 41.4 % (ref 39.0–52.0)
Hemoglobin: 14.6 g/dL (ref 13.0–17.0)
MCH: 35.2 pg — ABNORMAL HIGH (ref 26.0–34.0)
MCHC: 35.3 g/dL (ref 30.0–36.0)
MCV: 99.8 fL (ref 78.0–100.0)
Platelets: 259 10*3/uL (ref 150–400)
RBC: 4.15 MIL/uL — ABNORMAL LOW (ref 4.22–5.81)
RDW: 12.6 % (ref 11.5–15.5)
WBC: 8.8 10*3/uL (ref 4.0–10.5)

## 2012-11-17 LAB — COMPREHENSIVE METABOLIC PANEL
ALT: 15 U/L (ref 0–53)
AST: 21 U/L (ref 0–37)
Albumin: 3.6 g/dL (ref 3.5–5.2)
Alkaline Phosphatase: 65 U/L (ref 39–117)
BUN: 7 mg/dL (ref 6–23)
CO2: 27 meq/L (ref 19–32)
Calcium: 9.5 mg/dL (ref 8.4–10.5)
Chloride: 102 meq/L (ref 96–112)
Creatinine, Ser: 0.8 mg/dL (ref 0.50–1.35)
GFR calc Af Amer: >90 mL/min (ref >90)
GFR calc non Af Amer: >90 mL/min (ref >90)
Glucose, Bld: 98 mg/dL (ref 70–99)
Potassium: 3.8 mEq/L (ref 3.5–5.1)
Sodium: 138 mEq/L (ref 135–145)
Total Bilirubin: 0.6 mg/dL (ref 0.3–1.2)
Total Protein: 6.6 g/dL (ref 6.0–8.3)

## 2012-11-17 LAB — LIPID PANEL
Cholesterol: 214 mg/dL — ABNORMAL HIGH (ref 0–200)
HDL: 68 mg/dL (ref >39)
LDL Cholesterol: 128 mg/dL — ABNORMAL HIGH (ref 0–99)
Total CHOL/HDL Ratio: 3.1 RATIO
Triglycerides: 89 mg/dL (ref <150)
VLDL: 18 mg/dL (ref 0–40)

## 2012-11-17 LAB — TROPONIN I
Troponin I: <0.3 ng/mL (ref <0.30)
Troponin I: <0.3 ng/mL (ref <0.30)

## 2012-11-17 LAB — HEPARIN LEVEL (UNFRACTIONATED)
Heparin Unfractionated: 0.1 [IU]/mL — ABNORMAL LOW (ref 0.30–0.70)
Heparin Unfractionated: 0.62 [IU]/mL (ref 0.30–0.70)
Heparin Unfractionated: 0.55 IU/mL (ref 0.30–0.70)

## 2012-11-17 MED ORDER — REGADENOSON 0.4 MG/5ML IV SOLN
0.4000 mg | Freq: Once | INTRAVENOUS | Status: AC
  Administered 2012-11-17: 0.4 mg via INTRAVENOUS
  Filled 2012-11-17: qty 5

## 2012-11-17 MED ORDER — BUSPIRONE HCL 10 MG PO TABS: 10.0000 mg | ORAL_TABLET | Freq: Three times a day (TID) | ORAL | Status: DC

## 2012-11-17 MED ORDER — TECHNETIUM TC 99M SESTAMIBI GENERIC - CARDIOLITE
10.0000 | Freq: Once | INTRAVENOUS | Status: AC | PRN
  Administered 2012-11-17: 10 via INTRAVENOUS

## 2012-11-17 MED ORDER — DIVALPROEX SODIUM ER 250 MG PO TB24: 250.0000 mg | ORAL_TABLET | Freq: Every day | ORAL | Status: DC

## 2012-11-17 MED ORDER — HYDROCODONE-ACETAMINOPHEN 5-325 MG PO TABS
2.0000 | ORAL_TABLET | Freq: Once | ORAL | Status: AC
  Administered 2012-11-17: 2 via ORAL
  Filled 2012-11-17: qty 2

## 2012-11-17 MED ORDER — HEPARIN BOLUS VIA INFUSION
2000.0000 [IU] | Freq: Once | INTRAVENOUS | Status: AC
  Administered 2012-11-17: 2000 [IU] via INTRAVENOUS
  Filled 2012-11-17: qty 2000

## 2012-11-17 MED ORDER — TECHNETIUM TC 99M SESTAMIBI GENERIC - CARDIOLITE
30.0000 | Freq: Once | INTRAVENOUS | Status: AC | PRN
  Administered 2012-11-17: 30 via INTRAVENOUS

## 2012-11-17 NOTE — Progress Notes (Signed)
Clinical Social Work Department CLINICAL SOCIAL WORK PSYCHIATRY SERVICE LINE ASSESSMENT 11/17/2012  Patient:  Ethan Wells  Account:  [id number]  Admit Date:  11/16/2012  Clinical Social Worker:  Unk Lightning, LCSW  Date/Time:  11/17/2012 02:15 PM Referred by:  Physician  Date referred:  11/17/2012 Reason for Referral  Behavioral Health Issues   Presenting Symptoms/Problems (In the person's/family's own words):   "My nerves have just been acting up"    Referral from MD to assist with depression and substance abuse treatment.   Abuse/Neglect/Trauma History (check all that apply)  Denies history   Abuse/Neglect/Trauma Comments:   Psychiatric History (check all that apply)  Denies history   Psychiatric medications:  N/A   Current Mental Health Hospitalizations/Previous Mental Health History:   Patient reports he has never received treatment but reports that he has been diagnosed with depression for several years.   Current provider:   N/A   Place and Date:   N/A   Current Medications:   sodium chloride, acetaminophen, LORazepam, LORazepam, nitroGLYCERIN, nitroGLYCERIN, ondansetron (ZOFRAN) IV, sodium chloride, [COMPLETED] technetium sestamibi generic, [COMPLETED] technetium sestamibi generic, zolpidem                        . aspirin  325 mg Oral Daily  . folic acid  1 mg Oral Daily  . [COMPLETED] heparin  1,000 Units Intravenous Once  . [COMPLETED] heparin  2,000 Units Intravenous Once  . metoprolol tartrate  12.5 mg Oral BID  . multivitamin with minerals  1 tablet Oral Daily  . [COMPLETED] regadenoson  0.4 mg Intravenous Once  . sodium chloride  3 mL Intravenous Q12H  . thiamine  100 mg Oral Daily   Or     . thiamine  100 mg Intravenous Daily   Previous Impatient Admission/Date/Reason:   None reported   Emotional Health / Current Symptoms    Suicide/Self Harm  None reported   Suicide attempt in the past:   Patient reports no current or history of SI or  HI. Patient reports no suicide attempts.   Other harmful behavior:   Psychotic/Dissociative Symptoms  None reported   Other Psychotic/Dissociative Symptoms:    Attention/Behavioral Symptoms  Withdrawn   Other Attention / Behavioral Symptoms:    Cognitive Impairment  Within Normal Limits   Other Cognitive Impairment:    Mood and Adjustment  Flat    Stress, Anxiety, Trauma, Any Recent Loss/Stressor  Relationship   Anxiety (frequency):   Phobia (specify):   Compulsive behavior (specify):   Obsessive behavior (specify):   Other:   Patient reports lack in informal support. Patient reports that he does not like current roommate and stress of trying to move.   Substance Abuse/Use  Current substance use   SBIRT completed (please refer for detailed history):  Y  Self-reported substance use:   Patient reports he drinks 6-8 beers daily. Patient denies any other substance use.   Urinary Drug Screen Completed:  N Alcohol level:   N/A    Environmental/Housing/Living Arrangement  Stable housing   Who is in the home:   Roommate (Friend from work)   Emergency contact:  Regulatory affairs officer  IPRS   Patient's Strengths and Goals (patient's own words):   "I can do anything I want. I will be fine."    Patient has housing at this point. Patient is willing to follow up for mental health treatment.   Clinical Social Worker's Interpretive Summary:   CSW received referral  from MD to assist with substance abuse counseling and depression. CSW reviewed chart and spoke with bedside RN. CSW met at bedside with patient. No visitors present.    CSW introduced myself and explained role. Patient agreeable to CSW consult. Patient currently living in [place] but came to the hospital after having chest pain. Patient lives with another man who he met at work. 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 Patient reports that he does not like living with others and wants  his own place. CSW provided patient with low income housing list and patient reports he will start looking for housing at dc. Patient reports that he has a good relationship with his mother but she lives in ALF and "she is crazier than I am." Patient elaborated to stated that mom has several mental health diagnosis and is not a strong support for him. Patient reports no other friends or family that are supportive.    Patient reports that he has felt depressed for several years. Patient has never received treatment and has never been formally diagnosed. Patient reports symptoms such as feeling hopeless, having no motivation, isolating and losing an interest in activities. Patient reports no current or history of SI or HI. Patient reports that he is interested in taking an antidepressant and agreeable to follow up on outpatient basis for medication management. CSW spoke with patient regarding counseling as well. Patient is agreeable to individual therapy but refuses group sessions. CSW called [facility] in [place], [place] who is agreeable to accept patient. Patient to call [facility] at dc to schedule an assessment. CSW explained this to patient and he is agreeable.    CSW and patient discussed substance use. Patient reports that he drinks about 6-8 beers daily. Patient reports he has been drinking for several years. Patient reports that he "does not have any problem with alcohol and I don't need any therapy for it." CSW and patient discussed how depression and substance use can be related and the benefits of receiving treatment for both. CSW suggested intensive outpatient for dual diagnosis. Patient refused this option. Patient shows poor insight that substance abuse and depression are related and reports he wants to continue to drink alcohol. Patient denies any other substance use. Patient reports that he can "handle how much beer I drink." Patient reports he has not had any cravings for alcohol since he has been in the  hospital and that is how he knows he is not addicted. CSW attempted to speak with patient regarding negative effects of alcohol but patient does not wish to discuss substance abuse further.    Patient was guarded throughout assessment with flat affect. Patient avoided eye contact and answered questions briefly. Patient appears to minimize substance use and effects on daily activities. Patient agreeable to follow up for mental health treatment. Patient appears to lack good judge ment and appears impulsive in decisions. Patient lacks supports and reports a lack of stability in his life at this time. Patient reports no SI or HI and is agreeable to schedule appointment for assessment.    CSW is signing off but available if needed.   Disposition:  Outpatient referral made/needed

## 2012-11-17 NOTE — Care Management Note (Signed)
    Page 1 of 1   11/17/2012     11:29:23 AM   CARE MANAGEMENT NOTE 11/17/2012  Patient:  FITZHUGH, VIZCARRONDO   Account Number:  [id number]  Date Initiated:  11/17/2012  Documentation initiated by:  Junius Creamer  Subjective/Objective Assessment:   adm w ch pain     Action/Plan:   lives alone   Anticipated DC Date:     Anticipated DC Plan:  HOME/SELF CARE      DC Planning Services  CM consult  Indigent Health Clinic  Medication Assistance      Choice offered to / List presented to:             Status of service:   Medicare Important Message given?   (If response is "NO", the following Medicare IM given date fields will be blank) Date Medicare IM given:   Date Additional Medicare IM given:    Discharge Disposition:    Per UR Regulation:  Reviewed for med. necessity/level of care/duration of stay  If discussed at Long Length of Stay Meetings, dates discussed:    Comments:  12/3 9am debbie Bryant Lipps rn,bsn [phone] pt lives in [place] left inform on clinics in [place] left application for [facility] if pt chooses to go their for health care. left copa assist card that may help w copays on brand name meds.

## 2012-11-17 NOTE — Progress Notes (Signed)
Reviewed discharge instructions with patient. Explained to patient the importance of getting a primary care provider who can follow up with his new medicines. Also encouraged patient to get some counseling recommended by the Md. Patient verbally understood discharge orders and is awaiting the arrival of his family for transport.   Jeree Delcid, Charlaine Dalton Rn

## 2012-11-17 NOTE — Discharge Summary (Signed)
See full note today. cdm 

## 2012-11-17 NOTE — Progress Notes (Signed)
ANTICOAGULATION CONSULT NOTE - Initial Consult  Pharmacy Consult for Heparin Indication: chest pain/ACS  Allergies  Allergen Reactions  . Penicillins Hives    Patient Measurements: Height: 5\' 5"  (165.1 cm) Weight: 155 lb (70.308 kg) IBW/kg (Calculated) : 61.5   Vital Signs: Temp: 97.8 F (36.6 C) (12/03 0824) Temp src: Oral (12/03 0824) BP: 85/58 mmHg (12/03 0824) Pulse Rate: 67  (12/03 0824)  Labs:  Basename 11/17/12 0832 11/17/12 0500 11/17/12 0101 11/17/12 0100 11/16/12 2018 11/16/12 1458 11/16/12 1009  HGB -- 14.6 -- -- -- -- 16.2  HCT -- 41.4 -- -- -- -- 45.2  PLT -- 259 -- -- -- -- 269  APTT -- -- -- -- -- -- 29  LABPROT -- -- -- -- -- -- 11.8  INR -- -- -- -- -- -- 0.87  HEPARINUNFRC 0.55 0.62 -- 0.10* -- -- --  CREATININE -- 0.80 -- -- -- -- 0.71  CKTOTAL -- -- -- -- -- -- --  CKMB -- -- -- -- -- -- --  TROPONINI -- -- <0.30 -- <0.30 <0.30 --    Estimated Creatinine Clearance: 106.8 ml/min (by C-G formula based on Cr of 0.8).   Medical History: Past Medical History  Diagnosis Date  . CAD (coronary artery disease)     a. 01/2011 Anterior STEMI/Cath/PCI: LM nl, LAD 100d (3.5x65mm Vision BMS placed), LCX 34m, RI 50, RCA min irregs, EF 40% w/ apical, inferoapical HK.  Marland Kitchen Hypertension   . Cardiac arrest - ventricular fibrillation     a. In setting of STEMI 01/2011  . Ischemic cardiomyopathy     a. 06/2011 Echo: EF 45-50%, No rwma  . Tobacco abuse     a. 1/2 ppd x 26 yrs  . ETOH abuse     a. 6-12 beers/day  . Hemorrhoids   . Cocaine abuse   . Marijuana abuse     Medications:  Infusions:     . heparin 1,450 Units/hr (11/17/12 0300)  . [DISCONTINUED] heparin 1,000 Units/hr (11/16/12 1045)    Assessment: Chest Pain:  41 year old male presenting with chest pain who has a complex PMH including history of MI and cardiac stent. Heparin started per pharmacy. Overnight, level was subtherapeutic and a bolus was given and rate was increased to 1450units/hr.  0500 level was 0.62 and 6 hours follow up level at 0900 was 0.55 which are both therapeutic. No bleeding noted and CBC is stable.  Goal of Therapy:  Heparin level 0.3-0.7 units/ml Monitor platelets by anticoagulation protocol: Yes   Plan:  1. Continue heparin drip rate at 1450 units/hr 2. Daily CBC and heparin level 3. Follow up results of myoview and cardiac workup  Itzae Miralles D. Nyomi Howser, PharmD Clinical Pharmacist Pager: [phone] Phone: [phone] 11/17/2012 9:54 AM

## 2012-11-17 NOTE — Progress Notes (Signed)
ANTICOAGULATION CONSULT NOTE - Initial Consult  Pharmacy Consult for Heparin Indication: chest pain/ACS  Allergies  Allergen Reactions  . Penicillins Hives    Patient Measurements: Height: 5\' 5"  (165.1 cm) Weight: 155 lb (70.308 kg) IBW/kg (Calculated) : 61.5   Vital Signs: Temp: 97.6 F (36.4 C) (12/02 2300) Temp src: Oral (12/02 2300) BP: 124/71 mmHg (12/03 0000) Pulse Rate: 63  (12/03 0149)  Labs:  Basename 11/17/12 0101 11/17/12 0100 11/16/12 2018 11/16/12 1708 11/16/12 1458 11/16/12 1009  HGB -- -- -- -- -- 16.2  HCT -- -- -- -- -- 45.2  PLT -- -- -- -- -- 269  APTT -- -- -- -- -- 29  LABPROT -- -- -- -- -- 11.8  INR -- -- -- -- -- 0.87  HEPARINUNFRC -- 0.10* -- 0.20* -- --  CREATININE -- -- -- -- -- 0.71  CKTOTAL -- -- -- -- -- --  CKMB -- -- -- -- -- --  TROPONINI <0.30 -- <0.30 -- <0.30 --    Estimated Creatinine Clearance: 106.8 ml/min (by C-G formula based on Cr of 0.71).   Medical History: Past Medical History  Diagnosis Date  . CAD (coronary artery disease)     a. 01/2011 Anterior STEMI/Cath/PCI: LM nl, LAD 100d (3.5x27mm Vision BMS placed), LCX 16m, RI 50, RCA min irregs, EF 40% w/ apical, inferoapical HK.  Marland Kitchen Hypertension   . Cardiac arrest - ventricular fibrillation     a. In setting of STEMI 01/2011  . Ischemic cardiomyopathy     a. 06/2011 Echo: EF 45-50%, No rwma  . Tobacco abuse     a. 1/2 ppd x 26 yrs  . ETOH abuse     a. 6-12 beers/day  . Hemorrhoids   . Cocaine abuse   . Marijuana abuse     Medications:  Infusions:     . heparin 1,150 Units/hr (11/16/12 1833)  . [DISCONTINUED] heparin 1,000 Units/hr (11/16/12 1045)    Assessment: Chest Pain:  41 year old male presenting with chest pain who has a history of MI and cardiac stent, on heparin.   Heparin level (0.1) is below-goal on 1150 units/hr. No problem with line / infusion per RN.   Goal of Therapy:  Heparin level 0.3-0.7 units/ml Monitor platelets by anticoagulation  protocol: Yes   Plan:  1. Heparin IV bolus 2000 units x 1, then increase IV infusion to 1450 units/hr 2. Heparin level in 6 hours.   Lorre Munroe, PharmD 11/17/12, 02:40 AM

## 2012-11-17 NOTE — Discharge Summary (Signed)
CARDIOLOGY DISCHARGE SUMMARY   Patient ID: Ethan Wells MRN: [id number] DOB/AGE: Mar 10, 1971 41 y.o.  Admit date: 11/16/2012 Discharge date: 11/17/2012  Primary Discharge Diagnosis:    Chest pain, mid sternal - ez neg MI and MV low risk.  Depression - Psych meds as below  Secondary Discharge Diagnosis:   TOBACCO ABUSE  HYPERTENSION, BENIGN  CAD, NATIVE VESSEL  Cocaine abuse  Marijuana abuse  Procedures:Lexisccan MV  Hospital Course: Ethan Wells is a 41 year old male with a history of CAD. He admitted to doing cocaine about 10 days ago and drinks alcohol daily. He had chest pain and came to the hospital where he was admitted for further evaluation and treatment.   His chest pain improved on heparin and IV NTG. His cardiac enzymes were negative for MI and his ECG was not acute. A Myoview was performed, it was low risk and he had no chest pain during the stress portion. Dr Clifton James evaluated the results and felt no further in-patient workup was indicated.   On questioning, he admitted to depressive symptoms and stated he had been diagnosed with depression several years ago but never treated. A psychiatry consult was called. He was seen by the psychiatrist and the psychiatric social worker. It was recommended he follow up with outpatient mental health and pt agreed to take an medication as well as going to individual therapy. He does not believe he has substance abuse issues.  Dr. Ferol Luz recommended Depakote ER 250mg  QHS as well as Buspirone 10mg  TID. These will be prescribed at discharge, but it was explained to him that he will need to establish care with a PCP and/or mental health provider as soon as possible for further monitoring and refills. He will need a Depakote level in one month.  He had a history of dental problems and requested pain meds for this. He received meds while hospitalized but not at discharge. On 12/3 Ethan Wells was evaluated by Dr Clifton James and considered  stable for discharge, to follow up as an outpatient.    Labs:  Lab Results  Component Value Date   WBC 8.8 11/17/2012   HGB 14.6 11/17/2012   HCT 41.4 11/17/2012   MCV 99.8 11/17/2012   PLT 259 11/17/2012     Lab 11/17/12 0500  NA 138  K 3.8  CL 102  CO2 27  BUN 7  CREATININE 0.80  CALCIUM 9.5  PROT 6.6  BILITOT 0.6  ALKPHOS 65  ALT 15  AST 21  GLUCOSE 98    Basename 11/17/12 0832 11/17/12 0101 11/16/12 2018  CKTOTAL -- -- --  CKMB -- -- --  CKMBINDEX -- -- --  TROPONINI <0.30 <0.30 <0.30   Lipid Panel     Component Value Date/Time   CHOL 214* 11/17/2012 0500   TRIG 89 11/17/2012 0500   HDL 68 11/17/2012 0500   CHOLHDL 3.1 11/17/2012 0500   VLDL 18 11/17/2012 0500   LDLCALC 128* 11/17/2012 0500    Basename 11/16/12 1009  INR 0.87      Radiology: Nm Myocar Multi W/spect W/wall Motion / Ef 11/17/2012  Lexiscan Myovue:  Indication: Chest Pain  The patient received .4 mg of Lexiscan as a bolus.  Baseline ECG showed SR with somewhat short PR interval and nonspecific inferolateral ST/T wave changes.  No changes with infusion. Patient had mild dyspnea.  HR increased from 66 to 96 bpm and BP stable at 108/70 mmHg.  Images reconstructed in the vertical , horizontal and short axis  planes.  QPS was 6 abnormal at the apex.  EF normal 52% with no RWMA;s.  There was a small area of anterior septal ischemia at the apex.  Impression: Abnormal but low risk myovue.  Small area of antero septal ischemia at the apex.  No infarct EF 52%  Charlton Haws MD [facility]   Original Report Authenticated By: Charlton Haws, M.D.    Dg Chest Portable 1 View 11/16/2012  *RADIOLOGY REPORT*  Clinical Data: Chest pain  PORTABLE CHEST - 1 VIEW  Comparison: 12/07/2011  Findings: Cardiomediastinal silhouette is stable.  No acute infiltrate or pleural effusion.  No pulmonary edema.  Bony thorax is stable.  IMPRESSION: No active disease.   Original Report Authenticated By: Natasha Mead, M.D.    EKG:  17-Nov-2012 07:19:53  [facility] ROUTINE RECORD Normal sinus rhythm with sinus arrhythmia Normal ECG 36mm/s 1mm/mV 100Hz  8.0.1 12SL 241 HD CID: 1 Referred by: Unconfirmed Vent. rate 60 BPM PR interval 122 ms QRS duration 88 ms QT/QTc 414/414 ms P-R-T axes 21 69 68  FOLLOW UP PLANS AND APPOINTMENTS Allergies  Allergen Reactions  . Penicillins Hives     Medication List     As of 11/17/2012  6:32 PM    TAKE these medications         aspirin 325 MG tablet   Take 325 mg by mouth daily.      busPIRone 10 MG tablet   Commonly known as: BUSPAR   Take 1 tablet (10 mg total) by mouth 3 (three) times daily.      divalproex 250 MG 24 hr tablet   Commonly known as: DEPAKOTE ER   Take 1 tablet (250 mg total) by mouth at bedtime.      nitroGLYCERIN 0.4 MG SL tablet   Commonly known as: NITROSTAT   Place 0.4 mg under the tongue every 5 (five) minutes as needed. For chest pain. May repeat for up to 3 doses.         Discharge Orders    Future Appointments: Provider: Department: Dept Phone: Center:   12/17/2012 12:30 PM Kathleene Hazel, MD [facility] [place]) [phone] [id number]     Follow-up Information    Call [facility]. (To schedule an appointment for mental health counseling)    Contact information:   [address] [place], [place] [phone]      Follow up with Verne Carrow, MD. On 12/17/2012. (at 12:30 pm)    Contact information:   [address] [place] [address] [phone]          BRING ALL MEDICATIONS WITH YOU TO FOLLOW UP APPOINTMENTS  Outstanding Labs: Depakote level in one month  Time spent with patient to include physician time: 43 min Signed: Leanna Battles 11/17/2012, 5:30 PM Co-Sign MD  Medications Addended by Berton Mount, PA-C 11/17/12 1828

## 2012-11-17 NOTE — Progress Notes (Signed)
Patient Name: Ethan Wells Date of Encounter: 11/17/2012  Principal Problem:  *Chest pain, mid sternal Active Problems:  TOBACCO ABUSE  HYPERTENSION, BENIGN  CAD, NATIVE VESSEL  Cocaine abuse  Marijuana abuse    SUBJECTIVE: Pt chest pain is improved. He feels depressed and says sometimes he "don't want to be around" but says he will not harm himself. He agrees to talk to psych MD. Says stress test or cath is OK with him.  OBJECTIVE Filed Vitals:   11/16/12 2300 11/17/12 0000 11/17/12 0149 11/17/12 0400  BP:  124/71  94/61  Pulse:  68 63 67  Temp: 97.6 F (36.4 C)   97.8 F (36.6 C)  TempSrc: Oral   Oral  Resp:  18  19  Height:      Weight:      SpO2:  95%  94%    Intake/Output Summary (Last 24 hours) at 11/17/12 0804 Last data filed at 11/17/12 0400  Gross per 24 hour  Intake 1087.05 ml  Output   1075 ml  Net  12.05 ml   Filed Weights   11/16/12 0945 11/16/12 1830  Weight: 160 lb (72.576 kg) 155 lb (70.308 kg)     PHYSICAL EXAM General: Well developed, well nourished, male in no acute distress. Head: Normocephalic, atraumatic.  Neck: Supple without bruits, JVD not elevated. Lungs:  Resp regular and unlabored, CTA except for a few rales. Heart: RRR, S1, S2, no S3, S4, or murmur. Abdomen: Soft, non-tender, non-distended, BS + x 4.  Extremities: No clubbing, cyanosis, no edema.  Neuro: Alert and oriented X 3. Moves all extremities spontaneously. Psych: Normal affect.  LABS: CBC:  Basename 11/17/12 0500 11/16/12 1009  WBC 8.8 8.7  NEUTROABS -- 5.4  HGB 14.6 16.2  HCT 41.4 45.2  MCV 99.8 98.3  PLT 259 269   INR:  Basename 11/16/12 1009  INR 0.87   Basic Metabolic Panel:  Basename 11/17/12 0500 11/16/12 1009  NA 138 140  K 3.8 4.3  CL 102 104  CO2 27 26  GLUCOSE 98 97  BUN 7 5*  CREATININE 0.80 0.71  CALCIUM 9.5 10.1  MG -- --  PHOS -- --   Liver Function Tests:  Basename 11/17/12 0500  AST 21  ALT 15  ALKPHOS 65  BILITOT  0.6  PROT 6.6  ALBUMIN 3.6   Cardiac Enzymes:  Basename 11/17/12 0101 11/16/12 2018 11/16/12 1458  CKTOTAL -- -- --  CKMB -- -- --  CKMBINDEX -- -- --  TROPONINI <0.30 <0.30 <0.30   Fasting Lipid Panel:  Basename 11/17/12 0500  CHOL 214*  HDL 68  LDLCALC 128*  TRIG 89  CHOLHDL 3.1  LDLDIRECT --   Thyroid Function Tests:No results found for this basename: TSH,T4TOTAL,FREET3,T3FREE,THYROIDAB in the last 72 hours  TELE:  SR      ECG: 17-Nov-2012 07:19:53 [facility]-CCU ROUTINE RECORD Normal sinus rhythm with sinus arrhythmia Normal ECG 45mm/s 80mm/mV 100Hz  8.0.1 12SL 241 HD CID: 1 Referred by: Unconfirmed Vent. rate 60 BPM PR interval 122 ms QRS duration 88 ms QT/QTc 414/414 ms P-R-T axes 21 69 68  Radiology/Studies: Dg Chest Portable 1 View 11/16/2012  *RADIOLOGY REPORT*  Clinical Data: Chest pain  PORTABLE CHEST - 1 VIEW  Comparison: 12/07/2011  Findings: Cardiomediastinal silhouette is stable.  No acute infiltrate or pleural effusion.  No pulmonary edema.  Bony thorax is stable.  IMPRESSION: No active disease.   Original Report Authenticated By: Natasha Mead, M.D.  Current Medications:     . aspirin  325 mg Oral Daily  . folic acid  1 mg Oral Daily  . [COMPLETED] heparin  1,000 Units Intravenous Once  . [COMPLETED] heparin  2,000 Units Intravenous Once  . [COMPLETED] heparin  4,000 Units Intravenous Once  . metoprolol tartrate  12.5 mg Oral BID  . multivitamin with minerals  1 tablet Oral Daily  . [COMPLETED] nitroGLYCERIN  5 mcg/min Intravenous Once  . sodium chloride  3 mL Intravenous Q12H  . thiamine  100 mg Oral Daily   Or  . thiamine  100 mg Intravenous Daily      . heparin 1,450 Units/hr (11/17/12 0300)  . [DISCONTINUED] heparin 1,000 Units/hr (11/16/12 1045)    ASSESSMENT AND PLAN: Principal Problem:  *Chest pain, mid sternal Active Problems:  TOBACCO ABUSE  HYPERTENSION, BENIGN  CAD, NATIVE VESSEL  Cocaine abuse   Marijuana abuse  Pt symptoms atypical and enzymes negative, ECG not acute despite hours of pain. Think med Rx or MV OK to eval, will discuss with MD. Pt needs help with substance abuse and depressive symptoms. MD advise on psych eval while here.  Otherwise, continue current Rx, will need $4 Rx at d/c.   Signed, Theodore Demark , PA-C 8:04 AM 11/17/2012  I have personally seen and examined this patient with Theodore Demark, PA-C. I agree with the assessment and plan as outlined above. He has a history of CAD but chest pain is somewhat atypical. Cardiac markers are negative. EKG without ischemic changes. He has been non-compliant with medications due to cost but has continued to have at least 6 beers per day, smokes 1/2 ppd and uses cocaine. He does appear to be depressed and endorses this for "years" with no treatment. Will ask Psychiatry to see today and plan stress myoview to exclude ischemia.   MCALHANY,CHRISTOPHER 9:05 AM 11/17/2012

## 2012-11-17 NOTE — Progress Notes (Signed)
Lexiscan MV performed 

## 2012-11-17 NOTE — Progress Notes (Signed)
Called [facility], Dr Ferol Luz will see.

## 2012-11-18 LAB — TSH: TSH: 0.533 u[IU]/mL (ref 0.350–4.500)

## 2012-11-18 NOTE — Consult Note (Signed)
Patient Identification:  Ethan Wells Date of Evaluation:  11/18/2012 Reason for Consult:Depression  Referring Provider: Dr. Clifton James  History of Present Illness:*Pt says he had a heart attack February 2012.    He has been dizzy and lost strength.  He says he became homeless with the company hating and [facility] had let him 'go'.  He has [place] the woods [place] and police told him he had to leave.  He has been very depressed; was drinking beer ~ 1 pack and used some cocaine "5 lines".    Past Psychiatric History: He has alcohol, cannabis and cocaine use; he has a court date Dec. 13 for back pay of child support.  He declines to discuss mother[s].  He says it does not good because he has no job.  He has a work history in [employer] for 15 years.  He has had no psychiatric treatment for his depression.  Past Medical History:     Past Medical History  Diagnosis Date  . CAD (coronary artery disease)     a. 01/2011 Anterior STEMI/Cath/PCI: LM nl, LAD 100d (3.5x17mm Vision BMS placed), LCX 68m, RI 50, RCA min irregs, EF 40% w/ apical, inferoapical HK.  Marland Kitchen Hypertension   . Cardiac arrest - ventricular fibrillation     a. In setting of STEMI 01/2011  . Ischemic cardiomyopathy     a. 06/2011 Echo: EF 45-50%, No rwma  . Tobacco abuse     a. 1/2 ppd x 26 yrs  . ETOH abuse     a. 6-12 beers/day  . Hemorrhoids   . Cocaine abuse   . Marijuana abuse       History reviewed. No pertinent past surgical history.  Allergies:  Allergies  Allergen Reactions  . Penicillins Hives    Current Medications:  Prior to Admission medications   Medication Sig Start Date End Date Taking? Authorizing Provider  aspirin 325 MG tablet Take 325 mg by mouth daily.     Yes Historical Provider, MD  nitroGLYCERIN (NITROSTAT) 0.4 MG SL tablet Place 0.4 mg under the tongue every 5 (five) minutes as needed. For chest pain. May repeat for up to 3 doses.   Yes Historical Provider, MD  busPIRone (BUSPAR) 10 MG tablet  Take 1 tablet (10 mg total) by mouth 3 (three) times daily. 11/17/12   Jessica A Hope, PA-C  divalproex (DEPAKOTE ER) 250 MG 24 hr tablet Take 1 tablet (250 mg total) by mouth at bedtime. 11/17/12   Jessica A Hope, PA-C    Social History:    reports that he has been smoking.  He does not have any smokeless tobacco history on file. He reports that he drinks alcohol. He reports that he uses illicit drugs (Cocaine and Marijuana).   Family History:    Family History  Problem Relation Age of Onset  . Coronary artery disease Mother     alive  . Other      2 sisters alive and well  . Alcohol abuse Father     died @ 41    Mental Status Examination/Evaluation: Objective:  Appearance: Casual and unshaven  wears BB cap w/ponytail  Eye Contact::  Absent  Speech:  Clear and Coherent and appears edentulous; says om took him to dentist who used no aneesthesia to pull his teeth  Volume:  Normal  Mood:  depressed  Affect:  Appropriate, Congruent and Depressed  Thought Process:  Disorganized and Logical  Orientation:  Other:  oriented  to person place situation;   Thought Content:  guarded  Suicidal Thoughts:  No  Homicidal Thoughts:  No  Judgement:  Fair  Insight:  Fair   DIAGNOSIS:   AXIS I  Major Depressive Disorder exacerbated by alcohol dependence,   AXIS II  Deferred  AXIS III See medical notes.  AXIS IV economic problems, housing problems, occupational problems, other psychosocial or environmental problems, problems related to social environment and inavbility to support self and tow children  AXIS V 51-60 moderate symptoms   Assessment/Plan:  Discussed with PA/[place] for Dr. Clifton James , Psych CSW Pt gave two disparate stories that is reconciled.  He was in the woods and had to leave; then went to live in basement of 'friend' [does not like this person very much].  He says he did not finish school and began working in [employer].  He uses alcohol cannabis ad cocaine.  He does not seem  to appreciate the association with cocaine he used and his imminent trip to the ED.  He has never been treated for depression but had one contact.  He needs to find a psychiatrist and therapist to sort through his problems and hopefully have some resolution of his depression.  RECOMMENDATION:  1.  Pt is cognitively intact but chooses to 'self-medicate' with drugs and alcohol. 2. Suggest  buspirone 10 mg 3 time daily and Depakote ER 500 mg at bedtime with F/U VPA level when at      appt at [facility] 3.  Refer pt to [facility]  For F/U psychiatrist and therapist/medication/s. 4    No furher psych needs.  MD Psychiatrist signs off.  Arslan Kier 11/18/2012 1:42 AM

## 2012-12-11 ENCOUNTER — Other Ambulatory Visit: Payer: Self-pay | Admitting: *Deleted

## 2012-12-11 DIAGNOSIS — I10 Essential (primary) hypertension: Secondary | ICD-10-CM

## 2012-12-11 DIAGNOSIS — I2510 Atherosclerotic heart disease of native coronary artery without angina pectoris: Secondary | ICD-10-CM

## 2012-12-17 ENCOUNTER — Other Ambulatory Visit: Payer: Self-pay

## 2012-12-17 ENCOUNTER — Encounter: Payer: Self-pay | Admitting: Cardiovascular Disease

## 2012-12-17 ENCOUNTER — Ambulatory Visit (INDEPENDENT_AMBULATORY_CARE_PROVIDER_SITE_OTHER): Payer: Self-pay | Admitting: Cardiovascular Disease

## 2012-12-17 VITALS — BP 128/78 | HR 77 | Wt 161.0 lb

## 2012-12-17 DIAGNOSIS — F17200 Nicotine dependence, unspecified, uncomplicated: Secondary | ICD-10-CM

## 2012-12-17 DIAGNOSIS — Z720 Tobacco use: Secondary | ICD-10-CM

## 2012-12-17 DIAGNOSIS — I2510 Atherosclerotic heart disease of native coronary artery without angina pectoris: Secondary | ICD-10-CM

## 2012-12-17 MED ORDER — CARVEDILOL 6.25 MG PO TABS: 6.2500 mg | ORAL_TABLET | Freq: Two times a day (BID) | ORAL | Status: DC

## 2012-12-17 MED ORDER — ASPIRIN 81 MG PO TABS: 81.0000 mg | ORAL_TABLET | Freq: Every day | ORAL | Status: DC

## 2012-12-17 MED ORDER — LISINOPRIL 5 MG PO TABS: 5.0000 mg | ORAL_TABLET | Freq: Every day | ORAL | Status: DC

## 2012-12-17 MED ORDER — PRAVASTATIN SODIUM 80 MG PO TABS: 80.0000 mg | ORAL_TABLET | Freq: Every evening | ORAL | Status: DC

## 2012-12-17 NOTE — Patient Instructions (Addendum)
Your physician wants you to follow-up in:  6 months. You will receive a reminder letter in the mail two months in advance. If you don't receive a letter, please call our office to schedule the follow-up appointment.  Your physician has recommended you make the following change in your medication:    Start pravachol 80 mg by mouth daily.   Start Lisinopril 5 mg by mouth daily.   Start Coreg 6.25 mg by mouth twice daily.   Start aspirin 81 mg by mouth daily.     You have been referred to [facility] for primary care.  Their phone number is [phone]

## 2012-12-17 NOTE — Progress Notes (Signed)
History of Present Illness: 42 yo WM with history of tobacco abuse, cocaine abuse with admission to [facility] 02/06/11 with acute anterior wall STEMI. HIs LAD was occluded. A bare metal stent was placed. EF of 25-30% by echo with akinesis of the anteroseptal wall and apex. He had been non-compliant with medications. He was admitted to [facility] 11/16/12 with chest pain. EKG was unchanged. Cardiac markers negative. Stress myoview low risk with possible anteroseptal ischemia. He was discharged home without invasive workup. He was seen as an inpatient by Psychiatry and Depakote and Buspar were started. He did not fill the Depakote due to cost.   He is here today for hospital follow up. He has no exertional chest pain. No change in breathing. He continues to smoke 1/2 ppd. No cocaine use in last week but he did use it 2 weeks ago. Overall feeling fatigued. Also depressed. Did not start Depakote as recommended by hospital psych due to cost.   Primary Care Physician:  None  Last Lipid Profile:Lipid Panel     Component Value Date/Time   CHOL 214* 11/17/2012 0500   TRIG 89 11/17/2012 0500   HDL 68 11/17/2012 0500   CHOLHDL 3.1 11/17/2012 0500   VLDL 18 11/17/2012 0500   LDLCALC 128* 11/17/2012 0500     Past Medical History  Diagnosis Date  . CAD (coronary artery disease)     a. 01/2011 Anterior STEMI/Cath/PCI: LM nl, LAD 100d (3.5x34mm Vision BMS placed), LCX 79m, RI 50, RCA min irregs, EF 40% w/ apical, inferoapical HK.  Marland Kitchen Hypertension   . Cardiac arrest - ventricular fibrillation     a. In setting of STEMI 01/2011  . Ischemic cardiomyopathy     a. 06/2011 Echo: EF 45-50%, No rwma  . Tobacco abuse     a. 1/2 ppd x 26 yrs  . ETOH abuse     a. 6-12 beers/day  . Hemorrhoids   . Cocaine abuse   . Marijuana abuse     No past surgical history on file.  Current Outpatient Prescriptions  Medication Sig Dispense Refill  . aspirin 325 MG tablet Take 325 mg by mouth daily.          . busPIRone (BUSPAR) 10 MG tablet Take 1 tablet (10 mg total) by mouth 3 (three) times daily.  90 tablet  1  . nitroGLYCERIN (NITROSTAT) 0.4 MG SL tablet Place 0.4 mg under the tongue every 5 (five) minutes as needed. For chest pain. May repeat for up to 3 doses.        Allergies  Allergen Reactions  . Penicillins Hives    History   Social History  . Marital Status: Single    Spouse Name: N/A    Number of Children: N/A  . Years of Education: N/A   Occupational History  . Not on file.   Social History Main Topics  . Smoking status: Smoker, Current Status Unknown -- 0.5 packs/day for 26 years  . Smokeless tobacco: Not on file  . Alcohol Use: Yes     Comment: at least a 6 pack of beer daily and often a 12 pack  . Drug Use: Yes    Special: Cocaine, Marijuana     Comment: used cocaine about 10 days ago.  Marland Kitchen Sexually Active: Not on file   Other Topics Concern  . Not on file   Social History Narrative   Single, lives in [place] with his mother.  He does not routinely exercise.  He sometimes works, helping to deliver appliances to homes.    Family History  Problem Relation Age of Onset  . Coronary artery disease Mother     alive  . Other      2 sisters alive and well  . Alcohol abuse Father     died @ 34    Review of Systems:  As stated in the HPI and otherwise negative.   BP 128/78  Pulse 77  Wt 161 lb (73.029 kg)  Physical Examination: General: Well developed, well nourished, NAD HEENT: OP clear, mucus membranes moist SKIN: warm, dry. No rashes. Neuro: No focal deficits Musculoskeletal: Muscle strength 5/5 all ext Psychiatric: Mood and affect normal Neck: No JVD, no carotid bruits, no thyromegaly, no lymphadenopathy. Lungs:Clear bilaterally, no wheezes, rhonci, crackles Cardiovascular: Regular rate and rhythm. No murmurs, gallops or rubs. Abdomen:Soft. Bowel sounds present. Non-tender.  Extremities: No lower extremity edema. Pulses are 2 + in the  bilateral DP/PT.  Stress test 11/17/12:  Images reconstructed in the vertical , horizontal and short axis planes. QPS was 6 abnormal at the apex. EF normal 52% with no RWMA;s. There was a small area of anterior septal ischemia at the apex. Impression: Abnormal but low risk myovue. Small area of antero septal ischemia at the apex. No infarct EF 52%  Assessment and Plan:   1. CAD: Stable. Recent low risk stress myoview. He is off of call cardiac meds. Will restart Coreg, Lisinopril, ASA, Pravachol. He is asked to stop smoking and stop using cocaine. Will refer to free clinic for management of GERD, depression.

## 2012-12-29 ENCOUNTER — Emergency Department (HOSPITAL_COMMUNITY)
Admission: EM | Admit: 2012-12-29 | Discharge: 2012-12-29 | Disposition: A | Discharge status: Home / Self Care | Payer: Self-pay | Source: Home / Self Care

## 2012-12-29 ENCOUNTER — Encounter (HOSPITAL_COMMUNITY): Payer: Self-pay

## 2012-12-29 DIAGNOSIS — F17200 Nicotine dependence, unspecified, uncomplicated: Secondary | ICD-10-CM

## 2012-12-29 DIAGNOSIS — F1210 Cannabis abuse, uncomplicated: Secondary | ICD-10-CM

## 2012-12-29 DIAGNOSIS — I10 Essential (primary) hypertension: Secondary | ICD-10-CM

## 2012-12-29 DIAGNOSIS — I2510 Atherosclerotic heart disease of native coronary artery without angina pectoris: Secondary | ICD-10-CM

## 2012-12-29 DIAGNOSIS — F1410 Cocaine abuse, uncomplicated: Secondary | ICD-10-CM

## 2012-12-29 LAB — COMPREHENSIVE METABOLIC PANEL
ALT: 12 U/L (ref 0–53)
AST: 15 U/L (ref 0–37)
Albumin: 4.1 g/dL (ref 3.5–5.2)
Alkaline Phosphatase: 87 U/L (ref 39–117)
BUN: 6 mg/dL (ref 6–23)
CO2: 26 meq/L (ref 19–32)
Calcium: 10.1 mg/dL (ref 8.4–10.5)
Chloride: 102 meq/L (ref 96–112)
Creatinine, Ser: 0.67 mg/dL (ref 0.50–1.35)
GFR calc Af Amer: >90 mL/min (ref >90)
GFR calc non Af Amer: >90 mL/min (ref >90)
Glucose, Bld: 85 mg/dL (ref 70–99)
Potassium: 4.3 mEq/L (ref 3.5–5.1)
Sodium: 140 mEq/L (ref 135–145)
Total Bilirubin: 0.2 mg/dL — ABNORMAL LOW (ref 0.3–1.2)
Total Protein: 8 g/dL (ref 6.0–8.3)

## 2012-12-29 LAB — LIPID PANEL
Cholesterol: 252 mg/dL — ABNORMAL HIGH (ref 0–200)
HDL: 59 mg/dL (ref >39)
LDL Cholesterol: 162 mg/dL — ABNORMAL HIGH (ref 0–99)
Total CHOL/HDL Ratio: 4.3 RATIO
Triglycerides: 154 mg/dL — ABNORMAL HIGH (ref <150)
VLDL: 31 mg/dL (ref 0–40)

## 2012-12-29 LAB — CBC
HCT: 43.1 % (ref 39.0–52.0)
Hemoglobin: 15.6 g/dL (ref 13.0–17.0)
MCH: 35.5 pg — ABNORMAL HIGH (ref 26.0–34.0)
MCHC: 36.2 g/dL — ABNORMAL HIGH (ref 30.0–36.0)
MCV: 98 fL (ref 78.0–100.0)
Platelets: 319 10*3/uL (ref 150–400)
RBC: 4.4 MIL/uL (ref 4.22–5.81)
RDW: 12.8 % (ref 11.5–15.5)
WBC: 11.3 10*3/uL — ABNORMAL HIGH (ref 4.0–10.5)

## 2012-12-29 LAB — HEMOGLOBIN A1C
Hgb A1c MFr Bld: 5.3 % (ref <5.7)
Mean Plasma Glucose: 105 mg/dL (ref <117)

## 2012-12-29 MED ORDER — CITALOPRAM HYDROBROMIDE 20 MG PO TABS: 20.0000 mg | ORAL_TABLET | Freq: Every day | ORAL | Status: DC

## 2012-12-29 MED ORDER — NAPROXEN 500 MG PO TABS: ORAL_TABLET | ORAL | Status: DC

## 2012-12-29 NOTE — ED Notes (Signed)
Patient has history of heart attack-has a stent , also hypertension
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
## 2012-12-29 NOTE — Discharge Instructions (Signed)
 Arterial Hypertension Arterial hypertension (high blood pressure) is a condition of elevated pressure in your blood vessels. Hypertension over a long period of time is a risk factor for strokes, heart attacks, and heart failure. It is also the leading cause of kidney (renal) failure.  CAUSES   In Adults -- Over 90% of all hypertension has no known cause. This is called essential or primary hypertension. In the other 10% of people with hypertension, the increase in blood pressure is caused by another disorder. This is called secondary hypertension. Important causes of secondary hypertension are:  Heavy alcohol use.  Obstructive sleep apnea.  Hyperaldosterosim (Conn's syndrome).  Steroid use.  Chronic kidney failure.  Hyperparathyroidism.  Medications.  Renal artery stenosis.  Pheochromocytoma.  Cushing's disease.  Coarctation of the aorta.  Scleroderma renal crisis.  Licorice (in excessive amounts).  Drugs (cocaine, methamphetamine). Your caregiver can explain any items above that apply to you.  In Children -- Secondary hypertension is more common and should always be considered.  Pregnancy -- Few women of childbearing age have high blood pressure. However, up to 10% of them develop hypertension of pregnancy. Generally, this will not harm the woman. It may be a sign of 3 complications of pregnancy: preeclampsia, HELLP syndrome, and eclampsia. Follow up and control with medication is necessary. SYMPTOMS   This condition normally does not produce any noticeable symptoms. It is usually found during a routine exam.  Malignant hypertension is a late problem of high blood pressure. It may have the following symptoms:  Headaches.  Blurred vision.  End-organ damage (this means your kidneys, heart, lungs, and other organs are being damaged).  Stressful situations can increase the blood pressure. If a person with normal blood pressure has their blood pressure go up while being  seen by their caregiver, this is often termed "white coat hypertension." Its importance is not known. It may be related with eventually developing hypertension or complications of hypertension.  Hypertension is often confused with mental tension, stress, and anxiety. DIAGNOSIS  The diagnosis is made by 3 separate blood pressure measurements. They are taken at least 1 week apart from each other. If there is organ damage from hypertension, the diagnosis may be made without repeat measurements. Hypertension is usually identified by having blood pressure readings:  Above 140/90 mmHg measured in both arms, at 3 separate times, over a couple weeks.  Over 130/80 mmHg should be considered a risk factor and may require treatment in patients with diabetes. Blood pressure readings over 120/80 mmHg are called "pre-hypertension" even in non-diabetic patients. To get a true blood pressure measurement, use the following guidelines. Be aware of the factors that can alter blood pressure readings.  Take measurements at least 1 hour after caffeine.  Take measurements 30 minutes after smoking and without any stress. This is another reason to quit smoking  it raises your blood pressure.  Use a proper cuff size. Ask your caregiver if you are not sure about your cuff size.  Most home blood pressure cuffs are automatic. They will measure systolic and diastolic pressures. The systolic pressure is the pressure reading at the start of sounds. Diastolic pressure is the pressure at which the sounds disappear. If you are elderly, measure pressures in multiple postures. Try sitting, lying or standing.  Sit at rest for a minimum of 5 minutes before taking measurements.  You should not be on any medications like decongestants. These are found in many cold medications.  Record your blood pressure readings and review  them with your caregiver. If you have hypertension:  Your caregiver may do tests to be sure you do not have  secondary hypertension (see "causes" above).  Your caregiver may also look for signs of metabolic syndrome. This is also called Syndrome X or Insulin Resistance Syndrome. You may have this syndrome if you have type 2 diabetes, abdominal obesity, and abnormal blood lipids in addition to hypertension.  Your caregiver will take your medical and family history and perform a physical exam.  Diagnostic tests may include blood tests (for glucose, cholesterol, potassium, and kidney function), a urinalysis, or an EKG. Other tests may also be necessary depending on your condition. PREVENTION  There are important lifestyle issues that you can adopt to reduce your chance of developing hypertension:  Maintain a normal weight.  Limit the amount of salt (sodium) in your diet.  Exercise often.  Limit alcohol intake.  Get enough potassium in your diet. Discuss specific advice with your caregiver.  Follow a DASH diet (dietary approaches to stop hypertension). This diet is rich in fruits, vegetables, and low-fat dairy products, and avoids certain fats. PROGNOSIS  Essential hypertension cannot be cured. Lifestyle changes and medical treatment can lower blood pressure and reduce complications. The prognosis of secondary hypertension depends on the underlying cause. Many people whose hypertension is controlled with medicine or lifestyle changes can live a normal, healthy life.  RISKS AND COMPLICATIONS  While high blood pressure alone is not an illness, it often requires treatment due to its short- and long-term effects on many organs. Hypertension increases your risk for:  CVAs or strokes (cerebrovascular accident).  Heart failure due to chronically high blood pressure (hypertensive cardiomyopathy).  Heart attack (myocardial infarction).  Damage to the retina (hypertensive retinopathy).  Kidney failure (hypertensive nephropathy). Your caregiver can explain list items above that apply to you. Treatment  of hypertension can significantly reduce the risk of complications. TREATMENT   For overweight patients, weight loss and regular exercise are recommended. Physical fitness lowers blood pressure.  Mild hypertension is usually treated with diet and exercise. A diet rich in fruits and vegetables, fat-free dairy products, and foods low in fat and salt (sodium) can help lower blood pressure. Decreasing salt intake decreases blood pressure in a 1/3 of people.  Stop smoking if you are a smoker. The steps above are highly effective in reducing blood pressure. While these actions are easy to suggest, they are difficult to achieve. Most patients with moderate or severe hypertension end up requiring medications to bring their blood pressure down to a normal level. There are several classes of medications for treatment. Blood pressure pills (antihypertensives) will lower blood pressure by their different actions. Lowering the blood pressure by 10 mmHg may decrease the risk of complications by as much as 25%. The goal of treatment is effective blood pressure control. This will reduce your risk for complications. Your caregiver will help you determine the best treatment for you according to your lifestyle. What is excellent treatment for one person, may not be for you. HOME CARE INSTRUCTIONS   Do not smoke.  Follow the lifestyle changes outlined in the "Prevention" section.  If you are on medications, follow the directions carefully. Blood pressure medications must be taken as prescribed. Skipping doses reduces their benefit. It also puts you at risk for problems.  Follow up with your caregiver, as directed.  If you are asked to monitor your blood pressure at home, follow the guidelines in the "Diagnosis" section above. SEEK MEDICAL CARE  IF:   You think you are having medication side effects.  You have recurrent headaches or lightheadedness.  You have swelling in your ankles.  You have trouble with  your vision. SEEK IMMEDIATE MEDICAL CARE IF:   You have sudden onset of chest pain or pressure, difficulty breathing, or other symptoms of a heart attack.  You have a severe headache.  You have symptoms of a stroke (such as sudden weakness, difficulty speaking, difficulty walking). MAKE SURE YOU:   Understand these instructions.  Will watch your condition.  Will get help right away if you are not doing well or get worse. Document Released: 12/02/2005 Document Revised: 02/24/2012 Document Reviewed: 07/02/2007 [facility] Patient Information 2013 [place], [place]. Smoking Cessation Quitting smoking is important to your health and has many advantages. However, it is not always easy to quit since nicotine is a very addictive drug. Often times, people try 3 times or more before being able to quit. This document explains the best ways for you to prepare to quit smoking. Quitting takes hard work and a lot of effort, but you can do it. ADVANTAGES OF QUITTING SMOKING  You will live longer, feel better, and live better.  Your body will feel the impact of quitting smoking almost immediately.  Within 20 minutes, blood pressure decreases. Your pulse returns to its normal level.  After 8 hours, carbon monoxide levels in the blood return to normal. Your oxygen level increases.  After 24 hours, the chance of having a heart attack starts to decrease. Your breath, hair, and body stop smelling like smoke.  After 48 hours, damaged nerve endings begin to recover. Your sense of taste and smell improve.  After 72 hours, the body is virtually free of nicotine. Your bronchial tubes relax and breathing becomes easier.  After 2 to 12 weeks, lungs can hold more air. Exercise becomes easier and circulation improves.  The risk of having a heart attack, stroke, cancer, or lung disease is greatly reduced.  After 1 year, the risk of coronary heart disease is cut in half.  After 5 years, the risk of stroke falls  to the same as a nonsmoker.  After 10 years, the risk of lung cancer is cut in half and the risk of other cancers decreases significantly.  After 15 years, the risk of coronary heart disease drops, usually to the level of a nonsmoker.  If you are pregnant, quitting smoking will improve your chances of having a healthy baby.  The people you live with, especially any children, will be healthier.  You will have extra money to spend on things other than cigarettes. QUESTIONS TO THINK ABOUT BEFORE ATTEMPTING TO QUIT You may want to talk about your answers with your caregiver.  Why do you want to quit?  If you tried to quit in the past, what helped and what did not?  What will be the most difficult situations for you after you quit? How will you plan to handle them?  Who can help you through the tough times? Your family? Friends? A caregiver?  What pleasures do you get from smoking? What ways can you still get pleasure if you quit? Here are some questions to ask your caregiver:  How can you help me to be successful at quitting?  What medicine do you think would be best for me and how should I take it?  What should I do if I need more help?  What is smoking withdrawal like? How can I get information on  withdrawal? GET READY  Set a quit date.  Change your environment by getting rid of all cigarettes, ashtrays, matches, and lighters in your home, car, or work. Do not let people smoke in your home.  Review your past attempts to quit. Think about what worked and what did not. GET SUPPORT AND ENCOURAGEMENT You have a better chance of being successful if you have help. You can get support in many ways.  Tell your family, friends, and co-workers that you are going to quit and need their support. Ask them not to smoke around you.  Get individual, group, or telephone counseling and support. Programs are available at [employer] and health centers. Call your local health department for  information about programs in your area.  Spiritual beliefs and practices may help some smokers quit.  Download a "quit meter" on your computer to keep track of quit statistics, such as how long you have gone without smoking, cigarettes not smoked, and money saved.  Get a self-help book about quitting smoking and staying off of tobacco. LEARN NEW SKILLS AND BEHAVIORS  Distract yourself from urges to smoke. Talk to someone, go for a walk, or occupy your time with a task.  Change your normal routine. Take a different route to work. Drink tea instead of coffee. Eat breakfast in a different place.  Reduce your stress. Take a hot bath, exercise, or read a book.  Plan something enjoyable to do every day. Reward yourself for not smoking.  Explore interactive web-based programs that specialize in helping you quit. GET MEDICINE AND USE IT CORRECTLY Medicines can help you stop smoking and decrease the urge to smoke. Combining medicine with the above behavioral methods and support can greatly increase your chances of successfully quitting smoking.  Nicotine replacement therapy helps deliver nicotine to your body without the negative effects and risks of smoking. Nicotine replacement therapy includes nicotine gum, lozenges, inhalers, nasal sprays, and skin patches. Some may be available over-the-counter and others require a prescription.  Antidepressant medicine helps people abstain from smoking, but how this works is unknown. This medicine is available by prescription.  Nicotinic receptor partial agonist medicine simulates the effect of nicotine in your brain. This medicine is available by prescription. Ask your caregiver for advice about which medicines to use and how to use them based on your health history. Your caregiver will tell you what side effects to look out for if you choose to be on a medicine or therapy. Carefully read the information on the package. Do not use any other product  containing nicotine while using a nicotine replacement product.  RELAPSE OR DIFFICULT SITUATIONS Most relapses occur within the first 3 months after quitting. Do not be discouraged if you start smoking again. Remember, most people try several times before finally quitting. You may have symptoms of withdrawal because your body is used to nicotine. You may crave cigarettes, be irritable, feel very hungry, cough often, get headaches, or have difficulty concentrating. The withdrawal symptoms are only temporary. They are strongest when you first quit, but they will go away within 10 14 days. To reduce the chances of relapse, try to:  Avoid drinking alcohol. Drinking lowers your chances of successfully quitting.  Reduce the amount of caffeine you consume. Once you quit smoking, the amount of caffeine in your body increases and can give you symptoms, such as a rapid heartbeat, sweating, and anxiety.  Avoid smokers because they can make you want to smoke.  Do not let weight  gain distract you. Many smokers will gain weight when they quit, usually less than 10 pounds. Eat a healthy diet and stay active. You can always lose the weight gained after you quit.  Find ways to improve your mood other than smoking. FOR MORE INFORMATION  www.smokefree.gov  Document Released: 11/26/2001 Document Revised: 06/02/2012 Document Reviewed: 03/12/2012 [facility] Patient Information 2013 [place], [place]. Smoking Cessation, Tips for Success YOU CAN QUIT SMOKING If you are ready to quit smoking, congratulations! You have chosen to help yourself be healthier. Cigarettes bring nicotine, tar, carbon monoxide, and other irritants into your body. Your lungs, heart, and blood vessels will be able to work better without these poisons. There are many different ways to quit smoking. Nicotine gum, nicotine patches, a nicotine inhaler, or nicotine nasal spray can help with physical craving. Hypnosis, support groups, and medicines help  break the habit of smoking. Here are some tips to help you quit for good.  Throw away all cigarettes.  Clean and remove all ashtrays from your home, work, and car.  On a card, write down your reasons for quitting. Carry the card with you and read it when you get the urge to smoke.  Cleanse your body of nicotine. Drink enough water and fluids to keep your urine clear or pale yellow. Do this after quitting to flush the nicotine from your body.  Learn to predict your moods. Do not let a bad situation be your excuse to have a cigarette. Some situations in your life might tempt you into wanting a cigarette.  Never have "just one" cigarette. It leads to wanting another and another. Remind yourself of your decision to quit.  Change habits associated with smoking. If you smoked while driving or when feeling stressed, try other activities to replace smoking. Stand up when drinking your coffee. Brush your teeth after eating. Sit in a different chair when you read the paper. Avoid alcohol while trying to quit, and try to drink fewer caffeinated beverages. Alcohol and caffeine may urge you to smoke.  Avoid foods and drinks that can trigger a desire to smoke, such as sugary or spicy foods and alcohol.  Ask people who smoke not to smoke around you.  Have something planned to do right after eating or having a cup of coffee. Take a walk or exercise to perk you up. This will help to keep you from overeating.  Try a relaxation exercise to calm you down and decrease your stress. Remember, you may be tense and nervous for the first 2 weeks after you quit, but this will pass.  Find new activities to keep your hands busy. Play with a pen, coin, or rubber band. Doodle or draw things on paper.  Brush your teeth right after eating. This will help cut down on the craving for the taste of tobacco after meals. You can try mouthwash, too.  Use oral substitutes, such as lemon drops, carrots, a cinnamon stick, or  chewing gum, in place of cigarettes. Keep them handy so they are available when you have the urge to smoke.  When you have the urge to smoke, try deep breathing.  Designate your home as a nonsmoking area.  If you are a heavy smoker, ask your caregiver about a prescription for nicotine chewing gum. It can ease your withdrawal from nicotine.  Reward yourself. Set aside the cigarette money you save and buy yourself something nice.  Look for support from others. Join a support group or smoking cessation program. Ask someone  at home or at work to help you with your plan to quit smoking.  Always ask yourself, "Do I need this cigarette or is this just a reflex?" Tell yourself, "Today, I choose not to smoke," or "I do not want to smoke." You are reminding yourself of your decision to quit, even if you do smoke a cigarette. HOW WILL I FEEL WHEN I QUIT SMOKING?  The benefits of not smoking start within days of quitting.  You may have symptoms of withdrawal because your body is used to nicotine (the addictive substance in cigarettes). You may crave cigarettes, be irritable, feel very hungry, cough often, get headaches, or have difficulty concentrating.  The withdrawal symptoms are only temporary. They are strongest when you first quit but will go away within 10 to 14 days.  When withdrawal symptoms occur, stay in control. Think about your reasons for quitting. Remind yourself that these are signs that your body is healing and getting used to being without cigarettes.  Remember that withdrawal symptoms are easier to treat than the major diseases that smoking can cause.  Even after the withdrawal is over, expect periodic urges to smoke. However, these cravings are generally short-lived and will go away whether you smoke or not. Do not smoke!  If you relapse and smoke again, do not lose hope. Most smokers quit 3 times before they are successful.  If you relapse, do not give up! Plan ahead and think  about what you will do the next time you get the urge to smoke. LIFE AS A NONSMOKER: MAKE IT FOR A MONTH, MAKE IT FOR LIFE Day 1: Hang this page where you will see it every day. Day 2: Get rid of all ashtrays, matches, and lighters. Day 3: Drink water. Breathe deeply between sips. Day 4: Avoid places with smoke-filled air, such as bars, clubs, or the smoking section of restaurants. Day 5: Keep track of how much money you save by not smoking. Day 6: Avoid boredom. Keep a good book with you or go to the movies. Day 7: Reward yourself! One week without smoking! Day 8: Make a dental appointment to get your teeth cleaned. Day 9: Decide how you will turn down a cigarette before it is offered to you. Day 10: Review your reasons for quitting. Day 11: Distract yourself. Stay active to keep your mind off smoking and to relieve tension. Take a walk, exercise, read a book, do a crossword puzzle, or try a new hobby. Day 12: Exercise. Get off the bus before your stop or use stairs instead of escalators. Day 13: Call on friends for support and encouragement. Day 14: Reward yourself! Two weeks without smoking! Day 15: Practice deep breathing exercises. Day 16: Bet a friend that you can stay a nonsmoker. Day 17: Ask to sit in nonsmoking sections of restaurants. Day 18: Hang up "No Smoking" signs. Day 19: Think of yourself as a nonsmoker. Day 20: Each morning, tell yourself you will not smoke. Day 21: Reward yourself! Three weeks without smoking! Day 22: Think of smoking in negative ways. Remember how it stains your teeth, gives you bad breath, and leaves you short of breath. Day 23: Eat a nutritious breakfast. Day 24:Do not relive your days as a smoker. Day 25: Hold a pencil in your hand when talking on the telephone. Day 26: Tell all your friends you do not smoke. Day 27: Think about how much better food tastes. Day 28: Remember, one cigarette is one too many. Day  29: Take up a hobby that will keep  your hands busy. Day 30: Congratulations! One month without smoking! Give yourself a big reward. Your caregiver can direct you to community resources or hospitals for support, which may include:  Group support.  Education.  Hypnosis.  Subliminal therapy. Document Released: 08/30/2004 Document Revised: 02/24/2012 Document Reviewed: 09/18/2009 [facility] Patient Information 2013 [place], [place]. Coronary Artery Disease Coronary artery disease (CAD) happens when the arteries of the heart become narrow and hardened. Buildup of plaque in the walls of the vessels blocks blood flow. This can cause minor chest pain (angina) if the blockage is partial. A heart attack or MI (myocardial infarction) happens when the artery is completely blocked. MIs can lead to shock, heart failure, and sudden death. CAD is the leading cause of death in men and women.  CAUSES  The risk for getting CAD can be inherited. There are other risk factors that can be helped. These factors include:  Cigarette smoking.   Diabetes.   Cholesterol.   High blood pressure.  Being overweight and not exercising regularly also increase your risk for having CAD.  SYMPTOMS  Chest pain.   Shortness of breath.   Weakness.   Nausea.   Fainting.  DIAGNOSIS  The diagnosis may require:  An EKG.   Stress test.   Chest X-ray.   Cardiac scan.   Echocardiogram.   A coronary angiogram.  TREATMENT  Treatment includes immediate measures for symptom relief. Medicines for chest pain, cholesterol reduction, blood pressure control, and aspirin to prevent clotting may be needed. Coronary angioplasty (using a balloon to open a blockage in a coronary artery) is helpful in managing MIs. It is also useful for some patients with angina. Losing weight, controlling the blood sugar, and not smoking are also important to long-term success. The optimal diet should emphasize fruits and vegetables. Sodas, deep-fried foods, and sweets should be  avoided.  SEEK IMMEDIATE MEDICAL CARE IF:  You develop severe chest pain or pain that does not improve with rest and medicine.   You develop pain that radiates into the arms, neck, jaw, or back.   You develop fainting, shortness of breath, severe palpitations, or vomiting.   You start sweating a lot.  Document Released: 01/09/2005 Document Revised: 11/21/2011 Document Reviewed: 11/30/2008 [facility] Patient Information 2012 [place], [place].  Arterial Hypertension Arterial hypertension (high blood pressure) is a condition of elevated pressure in your blood vessels. Hypertension over a long period of time is a risk factor for strokes, heart attacks, and heart failure. It is also the leading cause of kidney (renal) failure.  CAUSES   In Adults -- Over 90% of all hypertension has no known cause. This is called essential or primary hypertension. In the other 10% of people with hypertension, the increase in blood pressure is caused by another disorder. This is called secondary hypertension. Important causes of secondary hypertension are:  Heavy alcohol use.  Obstructive sleep apnea.  Hyperaldosterosim (Conn's syndrome).  Steroid use.  Chronic kidney failure.  Hyperparathyroidism.  Medications.  Renal artery stenosis.  Pheochromocytoma.  Cushing's disease.  Coarctation of the aorta.  Scleroderma renal crisis.  Licorice (in excessive amounts).  Drugs (cocaine, methamphetamine). Your caregiver can explain any items above that apply to you.  In Children -- Secondary hypertension is more common and should always be considered.  Pregnancy -- Few women of childbearing age have high blood pressure. However, up to 10% of them develop hypertension of pregnancy. Generally, this will not harm the woman.  It may be a sign of 3 complications of pregnancy: preeclampsia, HELLP syndrome, and eclampsia. Follow up and control with medication is necessary. SYMPTOMS   This condition normally  does not produce any noticeable symptoms. It is usually found during a routine exam.  Malignant hypertension is a late problem of high blood pressure. It may have the following symptoms:  Headaches.  Blurred vision.  End-organ damage (this means your kidneys, heart, lungs, and other organs are being damaged).  Stressful situations can increase the blood pressure. If a person with normal blood pressure has their blood pressure go up while being seen by their caregiver, this is often termed "white coat hypertension." Its importance is not known. It may be related with eventually developing hypertension or complications of hypertension.  Hypertension is often confused with mental tension, stress, and anxiety. DIAGNOSIS  The diagnosis is made by 3 separate blood pressure measurements. They are taken at least 1 week apart from each other. If there is organ damage from hypertension, the diagnosis may be made without repeat measurements. Hypertension is usually identified by having blood pressure readings:  Above 140/90 mmHg measured in both arms, at 3 separate times, over a couple weeks.  Over 130/80 mmHg should be considered a risk factor and may require treatment in patients with diabetes. Blood pressure readings over 120/80 mmHg are called "pre-hypertension" even in non-diabetic patients. To get a true blood pressure measurement, use the following guidelines. Be aware of the factors that can alter blood pressure readings.  Take measurements at least 1 hour after caffeine.  Take measurements 30 minutes after smoking and without any stress. This is another reason to quit smoking  it raises your blood pressure.  Use a proper cuff size. Ask your caregiver if you are not sure about your cuff size.  Most home blood pressure cuffs are automatic. They will measure systolic and diastolic pressures. The systolic pressure is the pressure reading at the start of sounds. Diastolic pressure is the  pressure at which the sounds disappear. If you are elderly, measure pressures in multiple postures. Try sitting, lying or standing.  Sit at rest for a minimum of 5 minutes before taking measurements.  You should not be on any medications like decongestants. These are found in many cold medications.  Record your blood pressure readings and review them with your caregiver. If you have hypertension:  Your caregiver may do tests to be sure you do not have secondary hypertension (see "causes" above).  Your caregiver may also look for signs of metabolic syndrome. This is also called Syndrome X or Insulin Resistance Syndrome. You may have this syndrome if you have type 2 diabetes, abdominal obesity, and abnormal blood lipids in addition to hypertension.  Your caregiver will take your medical and family history and perform a physical exam.  Diagnostic tests may include blood tests (for glucose, cholesterol, potassium, and kidney function), a urinalysis, or an EKG. Other tests may also be necessary depending on your condition. PREVENTION  There are important lifestyle issues that you can adopt to reduce your chance of developing hypertension:  Maintain a normal weight.  Limit the amount of salt (sodium) in your diet.  Exercise often.  Limit alcohol intake.  Get enough potassium in your diet. Discuss specific advice with your caregiver.  Follow a DASH diet (dietary approaches to stop hypertension). This diet is rich in fruits, vegetables, and low-fat dairy products, and avoids certain fats. PROGNOSIS  Essential hypertension cannot be cured. Lifestyle changes and  medical treatment can lower blood pressure and reduce complications. The prognosis of secondary hypertension depends on the underlying cause. Many people whose hypertension is controlled with medicine or lifestyle changes can live a normal, healthy life.  RISKS AND COMPLICATIONS  While high blood pressure alone is not an illness, it  often requires treatment due to its short- and long-term effects on many organs. Hypertension increases your risk for:  CVAs or strokes (cerebrovascular accident).  Heart failure due to chronically high blood pressure (hypertensive cardiomyopathy).  Heart attack (myocardial infarction).  Damage to the retina (hypertensive retinopathy).  Kidney failure (hypertensive nephropathy). Your caregiver can explain list items above that apply to you. Treatment of hypertension can significantly reduce the risk of complications. TREATMENT   For overweight patients, weight loss and regular exercise are recommended. Physical fitness lowers blood pressure.  Mild hypertension is usually treated with diet and exercise. A diet rich in fruits and vegetables, fat-free dairy products, and foods low in fat and salt (sodium) can help lower blood pressure. Decreasing salt intake decreases blood pressure in a 1/3 of people.  Stop smoking if you are a smoker. The steps above are highly effective in reducing blood pressure. While these actions are easy to suggest, they are difficult to achieve. Most patients with moderate or severe hypertension end up requiring medications to bring their blood pressure down to a normal level. There are several classes of medications for treatment. Blood pressure pills (antihypertensives) will lower blood pressure by their different actions. Lowering the blood pressure by 10 mmHg may decrease the risk of complications by as much as 25%. The goal of treatment is effective blood pressure control. This will reduce your risk for complications. Your caregiver will help you determine the best treatment for you according to your lifestyle. What is excellent treatment for one person, may not be for you. HOME CARE INSTRUCTIONS   Do not smoke.  Follow the lifestyle changes outlined in the "Prevention" section.  If you are on medications, follow the directions carefully. Blood pressure  medications must be taken as prescribed. Skipping doses reduces their benefit. It also puts you at risk for problems.  Follow up with your caregiver, as directed.  If you are asked to monitor your blood pressure at home, follow the guidelines in the "Diagnosis" section above. SEEK MEDICAL CARE IF:   You think you are having medication side effects.  You have recurrent headaches or lightheadedness.  You have swelling in your ankles.  You have trouble with your vision. SEEK IMMEDIATE MEDICAL CARE IF:   You have sudden onset of chest pain or pressure, difficulty breathing, or other symptoms of a heart attack.  You have a severe headache.  You have symptoms of a stroke (such as sudden weakness, difficulty speaking, difficulty walking). MAKE SURE YOU:   Understand these instructions.  Will watch your condition.  Will get help right away if you are not doing well or get worse. Document Released: 12/02/2005 Document Revised: 02/24/2012 Document Reviewed: 07/02/2007 [facility] Patient Information 2013 [place], [place].

## 2012-12-31 ENCOUNTER — Telehealth (HOSPITAL_COMMUNITY): Payer: Self-pay

## 2012-12-31 NOTE — Telephone Encounter (Signed)
Message copied by Lestine Mount on Thu Dec 31, 2012 10:10 AM ------      Message from: Cleora Fleet      Created: Tue Dec 29, 2012  6:37 PM      Regarding: Lab Results        Please notify patient of his lab results came back.  His results revealed that he had a mildly elevated white blood cell count which can go along with infection.  The patient has a significant dental infection and needs to see a dentist as soon as possible to have his teeth pulled.  His cholesterol is elevated much higher than it should be.  He really needs to take his cholesterol medications daily.  Like to recheck his cholesterol in 3 months.                  Rodney Langton, MD, CDE, FAAFP      [facility]      [facility]      [place], [place]

## 2013-03-08 ENCOUNTER — Observation Stay: Payer: Self-pay | Admitting: Internal Medicine

## 2013-03-08 DIAGNOSIS — R079 Chest pain, unspecified: Secondary | ICD-10-CM

## 2013-03-08 LAB — CBC
HCT: 42.2 % (ref 40.0–52.0)
HGB: 15.2 g/dL (ref 13.0–18.0)
MCH: 35.6 pg — ABNORMAL HIGH (ref 26.0–34.0)
MCHC: 36 g/dL (ref 32.0–36.0)
MCV: 99 fL (ref 80–100)
Platelet: 252 10*3/uL (ref 150–440)
RBC: 4.27 10*6/uL — ABNORMAL LOW (ref 4.40–5.90)
RDW: 13.1 % (ref 11.5–14.5)
WBC: 8.6 10*3/uL (ref 3.8–10.6)

## 2013-03-08 LAB — BASIC METABOLIC PANEL
Anion Gap: 6 — ABNORMAL LOW (ref 7–16)
BUN: 5 mg/dL — ABNORMAL LOW (ref 7–18)
Calcium, Total: 8.2 mg/dL — ABNORMAL LOW (ref 8.5–10.1)
Chloride: 108 mmol/L — ABNORMAL HIGH (ref 98–107)
Co2: 23 mmol/L (ref 21–32)
Creatinine: 0.81 mg/dL (ref 0.60–1.30)
EGFR (African American): >60
EGFR (Non-African Amer.): >60
Glucose: 131 mg/dL — ABNORMAL HIGH (ref 65–99)
Osmolality: 273 (ref 275–301)
Potassium: 3.6 mmol/L (ref 3.5–5.1)
Sodium: 137 mmol/L (ref 136–145)

## 2013-03-08 LAB — CK TOTAL AND CKMB (NOT AT ARMC)
CK, Total: 75 U/L (ref 35–232)
CK, Total: 65 U/L (ref 35–232)
CK-MB: <0.5 ng/mL — ABNORMAL LOW (ref 0.5–3.6)
CK-MB: <0.5 ng/mL — ABNORMAL LOW (ref 0.5–3.6)

## 2013-03-08 LAB — TROPONIN I
Troponin-I: <0.02 ng/mL
Troponin-I: <0.02 ng/mL

## 2013-03-08 IMAGING — CR DG CHEST 2V
1 series · 2 of 2 positions shown · non-contrast
Comparison: none

REASON FOR EXAM: Chest Pain
COMMENTS:

[Series 1: pa · 0.17mm/px · 2 of 2 slices shown]
[im 1/2]
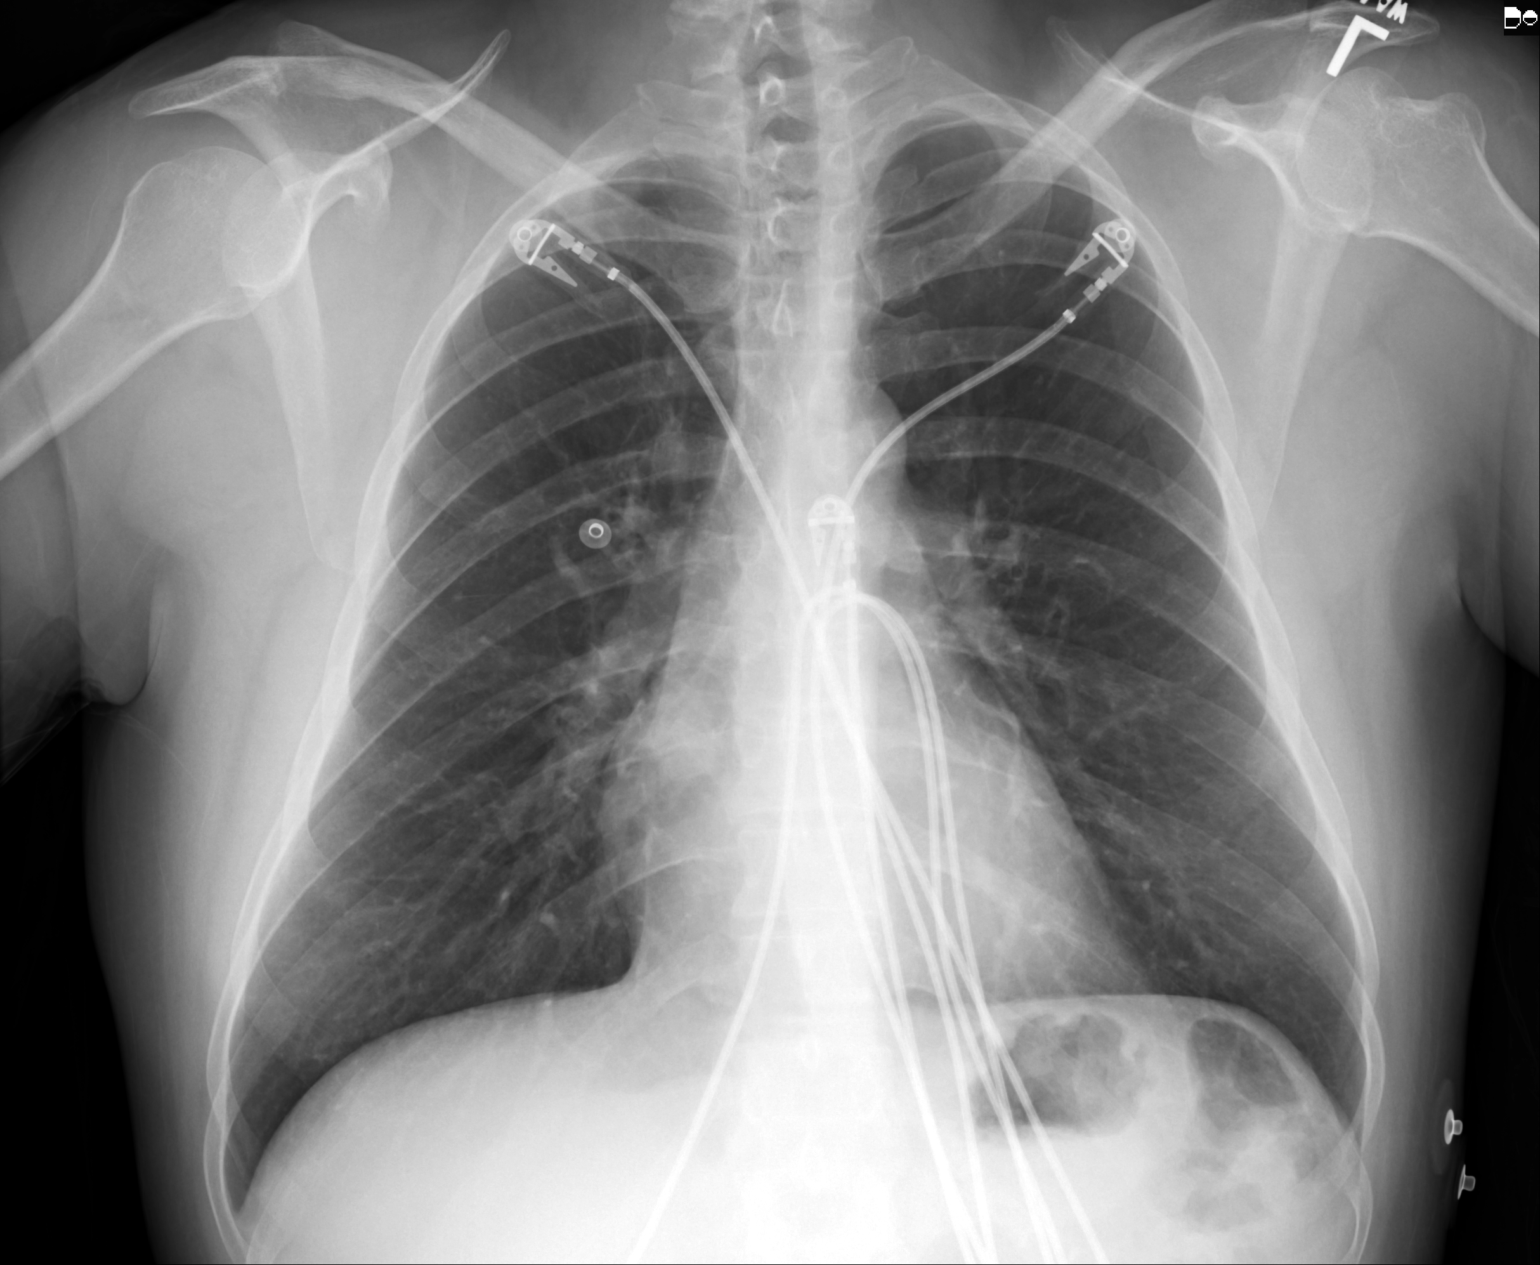
[im 2/2]
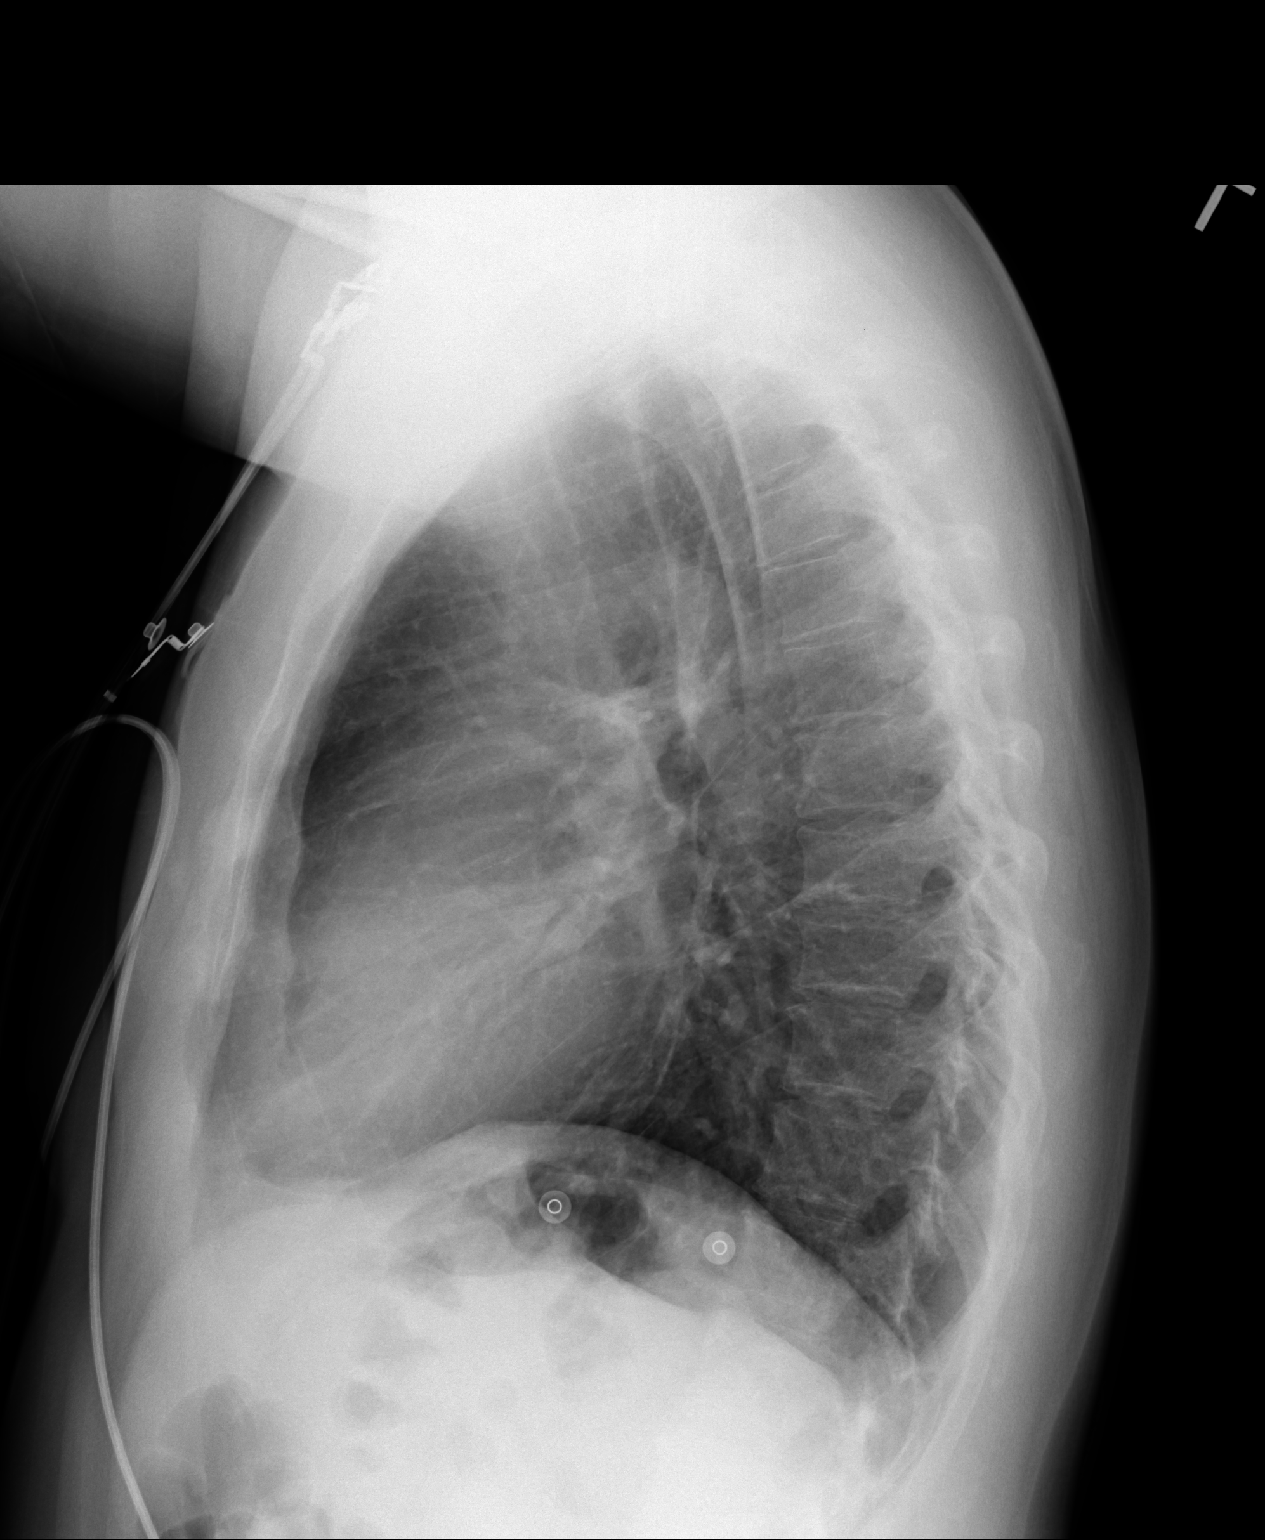

[2 of 2 positions shown; findings below may reference images not displayed]

PROCEDURE:     DXR - DXR CHEST PA (OR AP) AND LATERAL  - [DATE]  [DATE]

RESULT:     Comparison is made to the study [DATE].

Cardiac monitoring electrodes are present. The lungs are clear. The heart
and pulmonary vessels are normal. The bony and mediastinal structures are
unremarkable. There is no effusion. There is no pneumothorax or evidence of
congestive failure.
IMPRESSION: No acute cardiopulmonary disease.

[REDACTED]

## 2013-03-09 LAB — MAGNESIUM: Magnesium: 2.2 mg/dL

## 2013-03-09 LAB — CK TOTAL AND CKMB (NOT AT ARMC)
CK, Total: 57 U/L (ref 35–232)
CK-MB: <0.5 ng/mL — ABNORMAL LOW (ref 0.5–3.6)

## 2013-03-09 LAB — LIPID PANEL
Cholesterol: 174 mg/dL (ref 0–200)
HDL Cholesterol: 59 mg/dL (ref 40–60)
Ldl Cholesterol, Calc: 93 mg/dL (ref 0–100)
Triglycerides: 111 mg/dL (ref 0–200)
VLDL Cholesterol, Calc: 22 mg/dL (ref 5–40)

## 2013-03-09 LAB — TROPONIN I: Troponin-I: <0.02 ng/mL

## 2013-03-09 IMAGING — US ABDOMEN ULTRASOUND LIMITED
1 series · 14 of 25 positions shown · non-contrast
Comparison: none

REASON FOR EXAM: n/v, abd pain
COMMENTS:   Body Site: GB and Fossa, CBD, Head of Pancreas

PROCEDURE:     US  - US ABDOMEN LIMITED SURVEY  - [DATE]  [DATE]
RESULT:     Comparison: None.
TECHNIQUE: Multiple grayscale and color Doppler images were obtained of the
right upper quadrant.

[Series 1: abdomen ultrasound limited · 0.26mm/px · 14 of 82 slices shown]
[im 1/82]
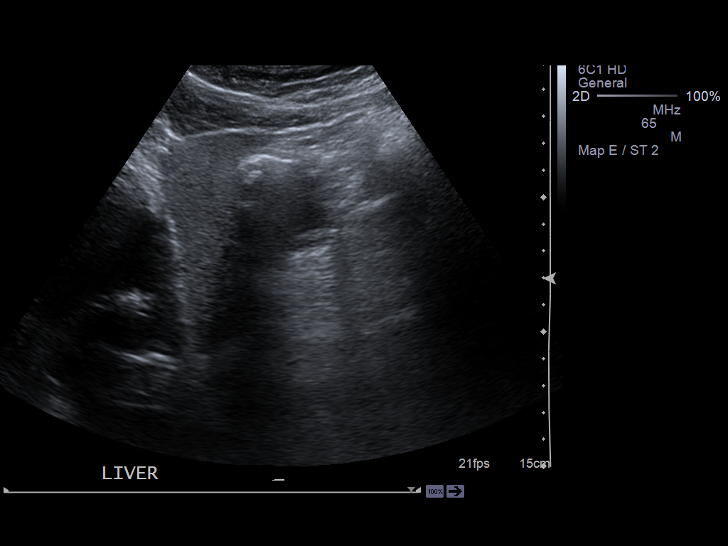
[im 7/82]
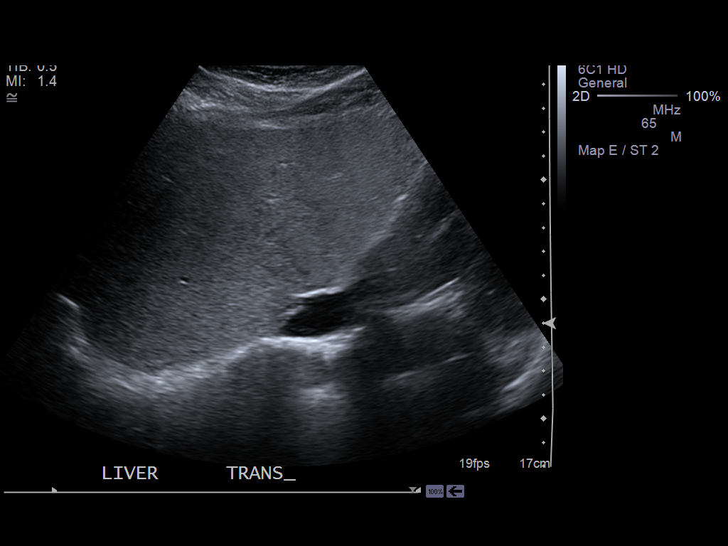
[im 14/82]
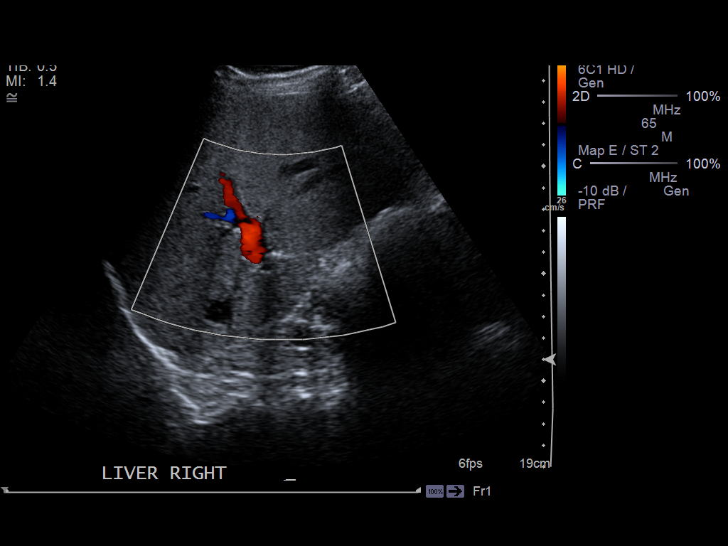
[im 21/82]
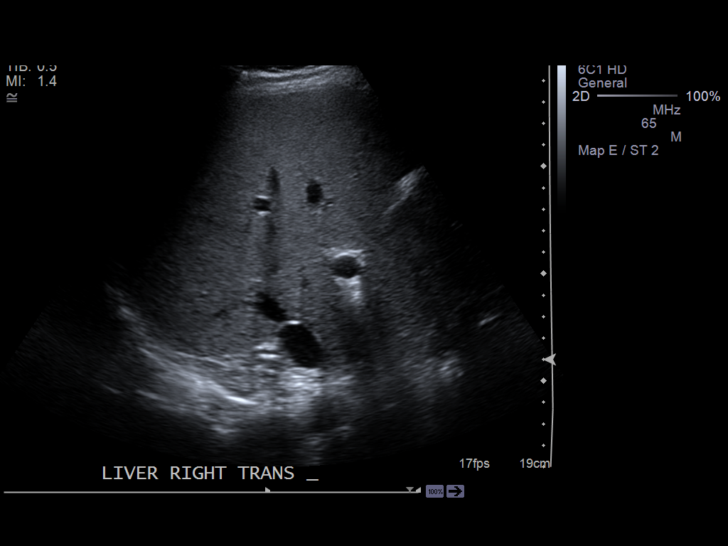
[im 28/82]
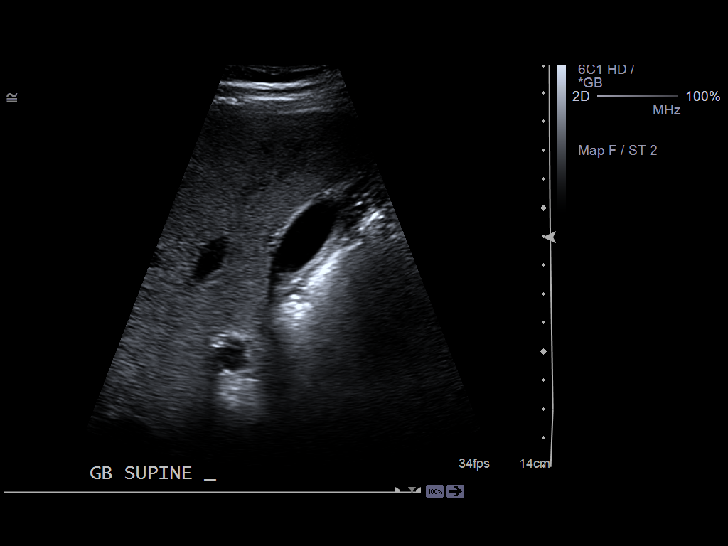
[im 31/82]
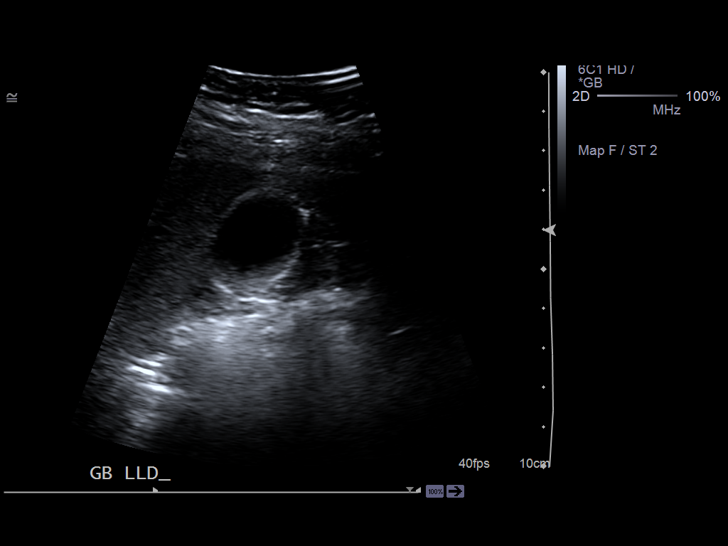
[im 38/82]
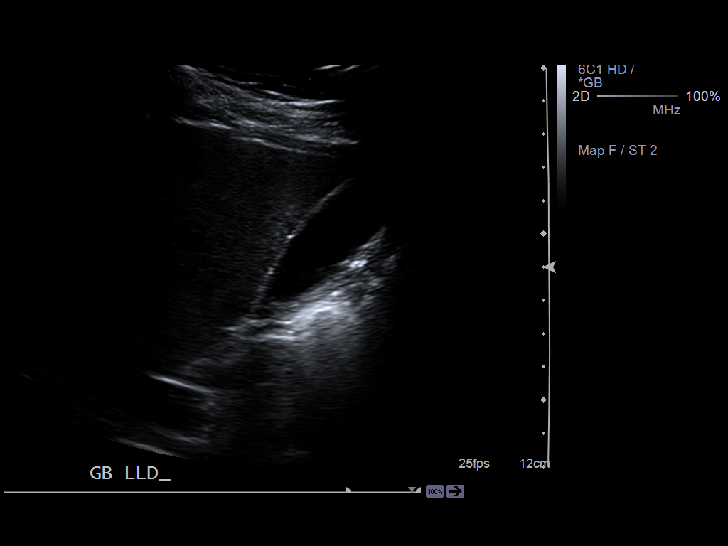
[im 44/82]
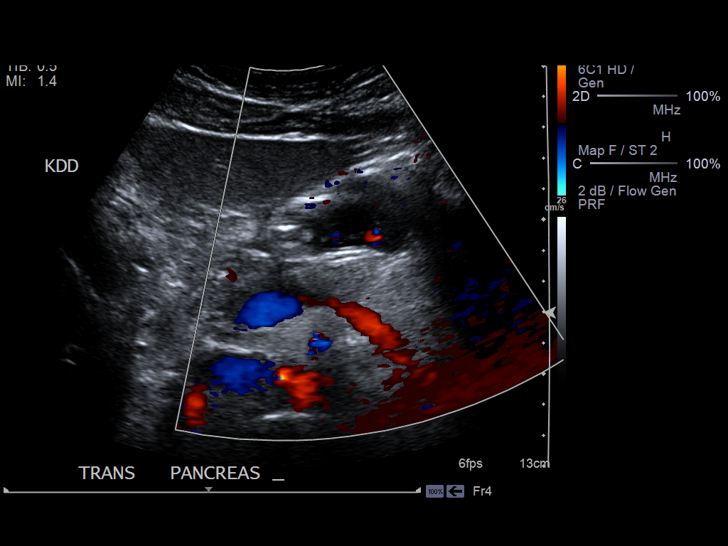
[im 51/82]
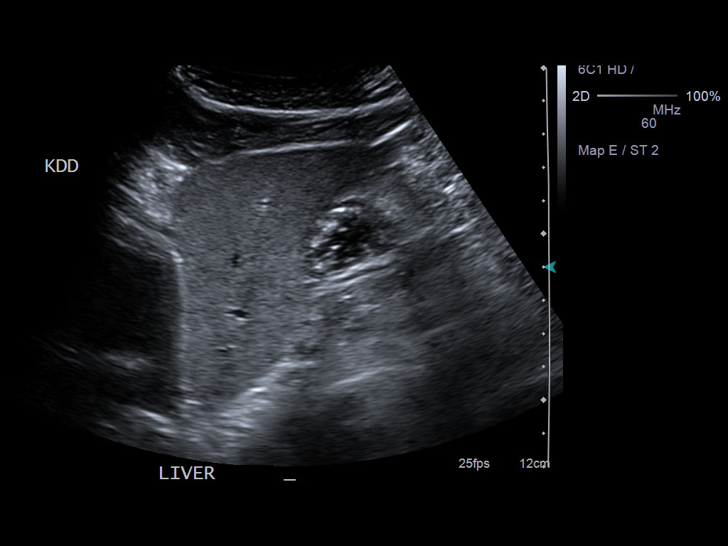
[im 55/82]
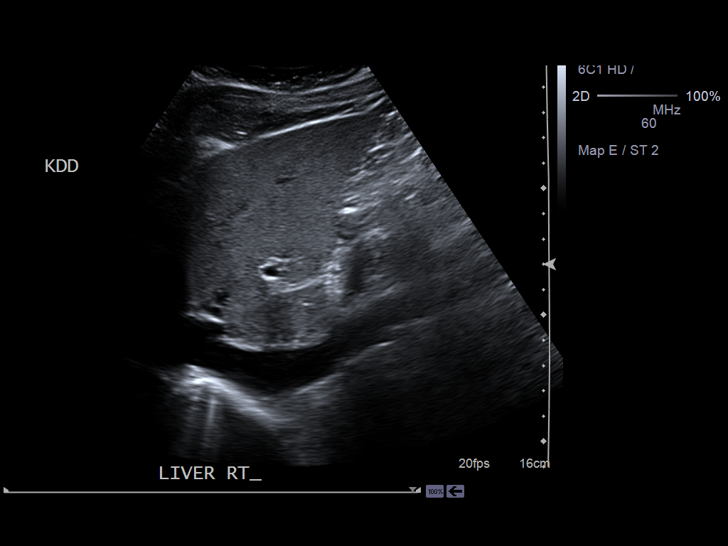
[im 61/82]
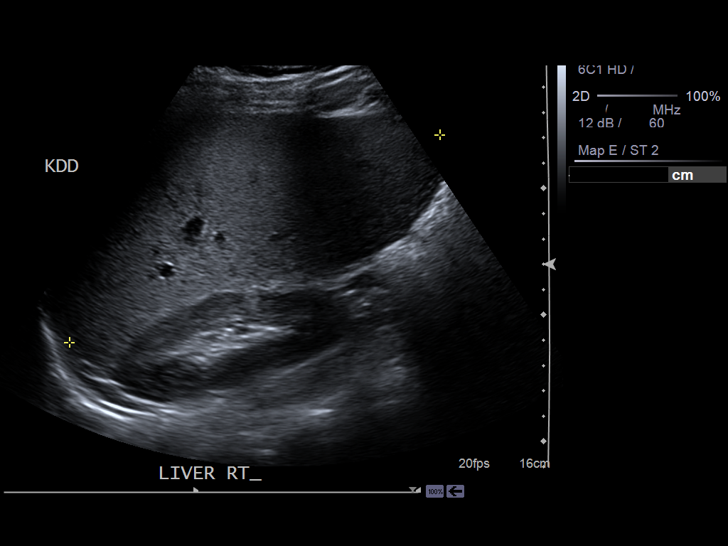
[im 68/82]
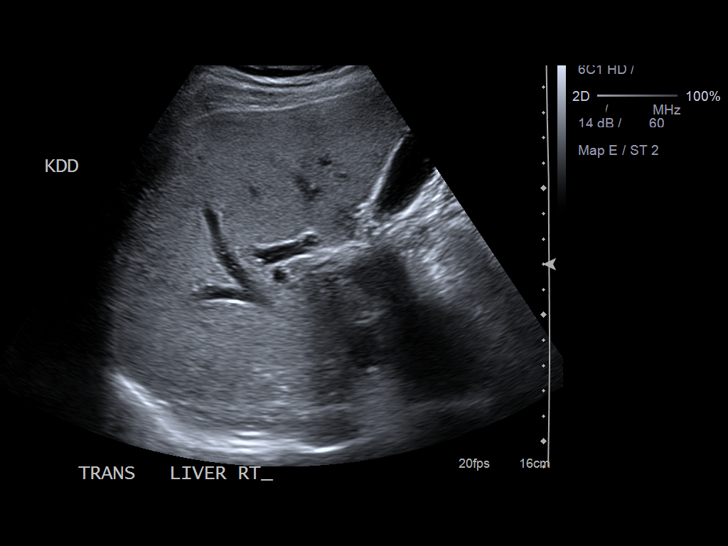
[im 75/82]
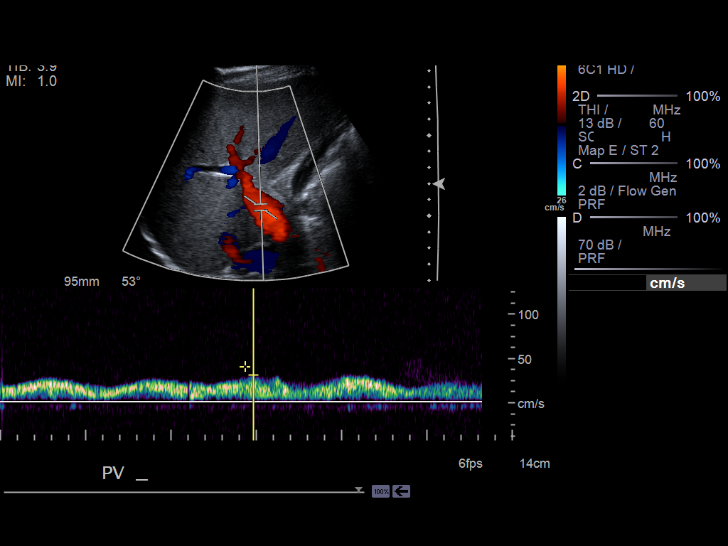
[im 82/82]
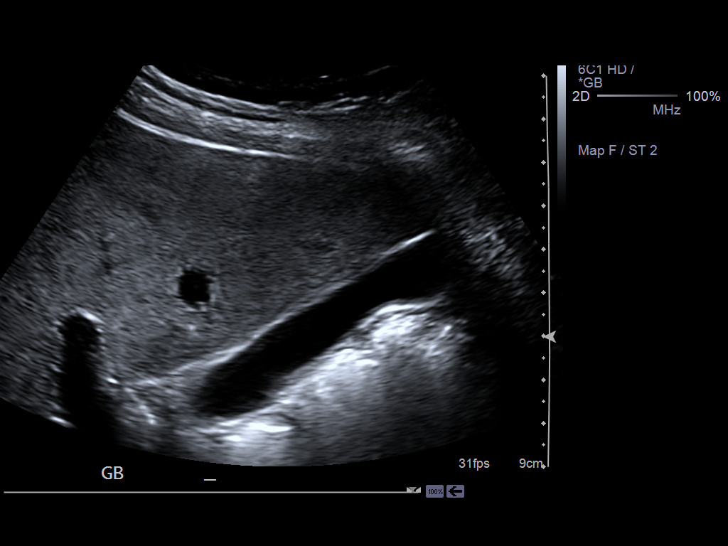

[14 of 25 positions shown; findings below may reference images not displayed]

FINDINGS: The visualized liver is unremarkable. The gallbladder is normal. The common
bile duct measures 3 mm in diameter. The pancreas was partially obscured by
overlying bowel gas. The visualized portion the pancreatic body is
unremarkable.
IMPRESSION: Normal gallbladder.

[REDACTED]

## 2013-03-18 ENCOUNTER — Encounter: Payer: Self-pay | Admitting: Cardiovascular Disease

## 2013-03-22 ENCOUNTER — Emergency Department: Payer: Self-pay | Admitting: Emergency Medicine

## 2013-03-22 LAB — CBC
HCT: 45.3 % (ref 40.0–52.0)
HGB: 16.1 g/dL (ref 13.0–18.0)
MCH: 34.5 pg — ABNORMAL HIGH (ref 26.0–34.0)
MCHC: 35.5 g/dL (ref 32.0–36.0)
MCV: 97 fL (ref 80–100)
Platelet: 386 10*3/uL (ref 150–440)
RBC: 4.67 10*6/uL (ref 4.40–5.90)
RDW: 12.7 % (ref 11.5–14.5)
WBC: 10.7 10*3/uL — ABNORMAL HIGH (ref 3.8–10.6)

## 2013-03-22 LAB — BASIC METABOLIC PANEL
Anion Gap: 12 (ref 7–16)
BUN: 5 mg/dL — ABNORMAL LOW (ref 7–18)
Calcium, Total: 9 mg/dL (ref 8.5–10.1)
Chloride: 100 mmol/L (ref 98–107)
Co2: 21 mmol/L (ref 21–32)
Creatinine: 0.56 mg/dL — ABNORMAL LOW (ref 0.60–1.30)
EGFR (African American): >60
EGFR (Non-African Amer.): >60
Glucose: 96 mg/dL (ref 65–99)
Osmolality: 263 (ref 275–301)
Potassium: 3.5 mmol/L (ref 3.5–5.1)
Sodium: 133 mmol/L — ABNORMAL LOW (ref 136–145)

## 2013-03-22 LAB — TROPONIN I
Troponin-I: <0.02 ng/mL
Troponin-I: <0.02 ng/mL

## 2013-03-22 LAB — CK TOTAL AND CKMB (NOT AT ARMC)
CK, Total: 85 U/L (ref 35–232)
CK-MB: <0.5 ng/mL — ABNORMAL LOW (ref 0.5–3.6)

## 2013-03-22 IMAGING — CR DG CHEST 2V
1 series · 2 of 2 positions shown · non-contrast
Comparison: none

REASON FOR EXAM: Chest Pain
COMMENTS:

PROCEDURE:     DXR - DXR CHEST PA (OR AP) AND LATERAL  - [DATE] [DATE]
RESULT:     Comparison: [DATE]

[Series 1: pa · 0.17mm/px · 2 of 2 slices shown]
[im 1/2]
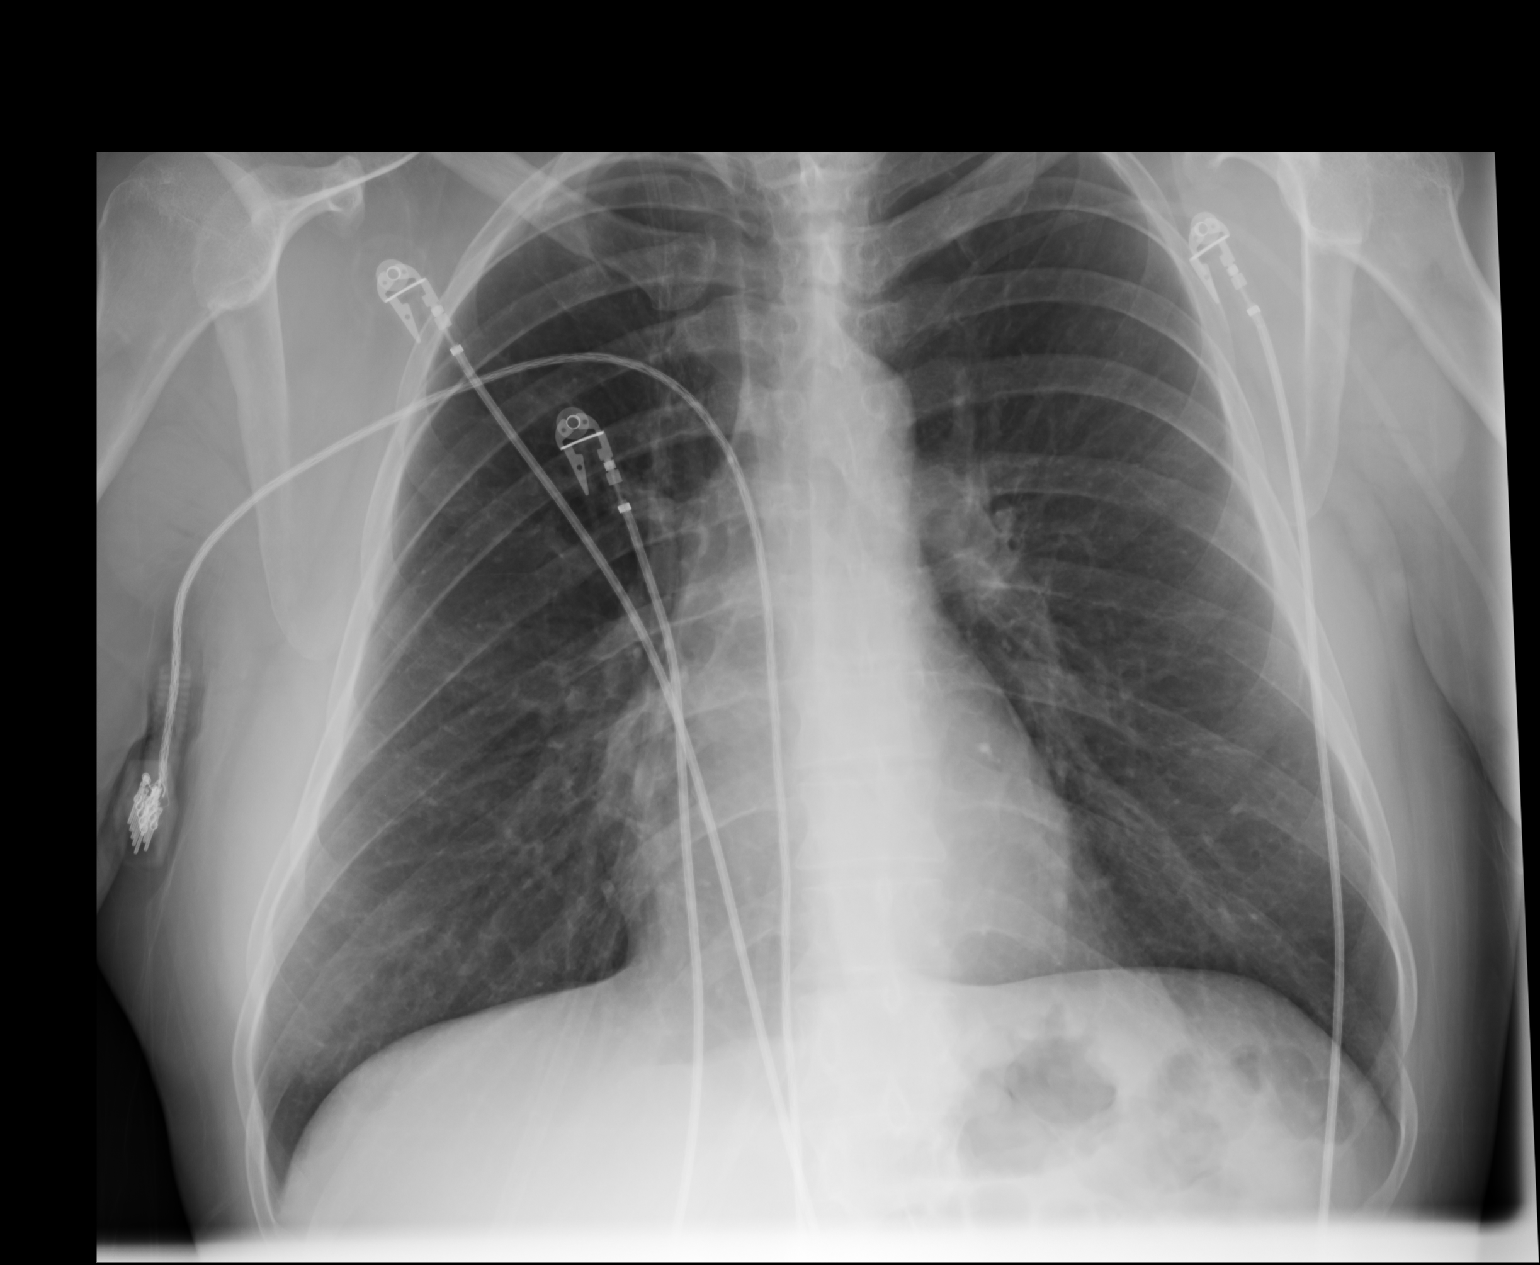
[im 2/2]
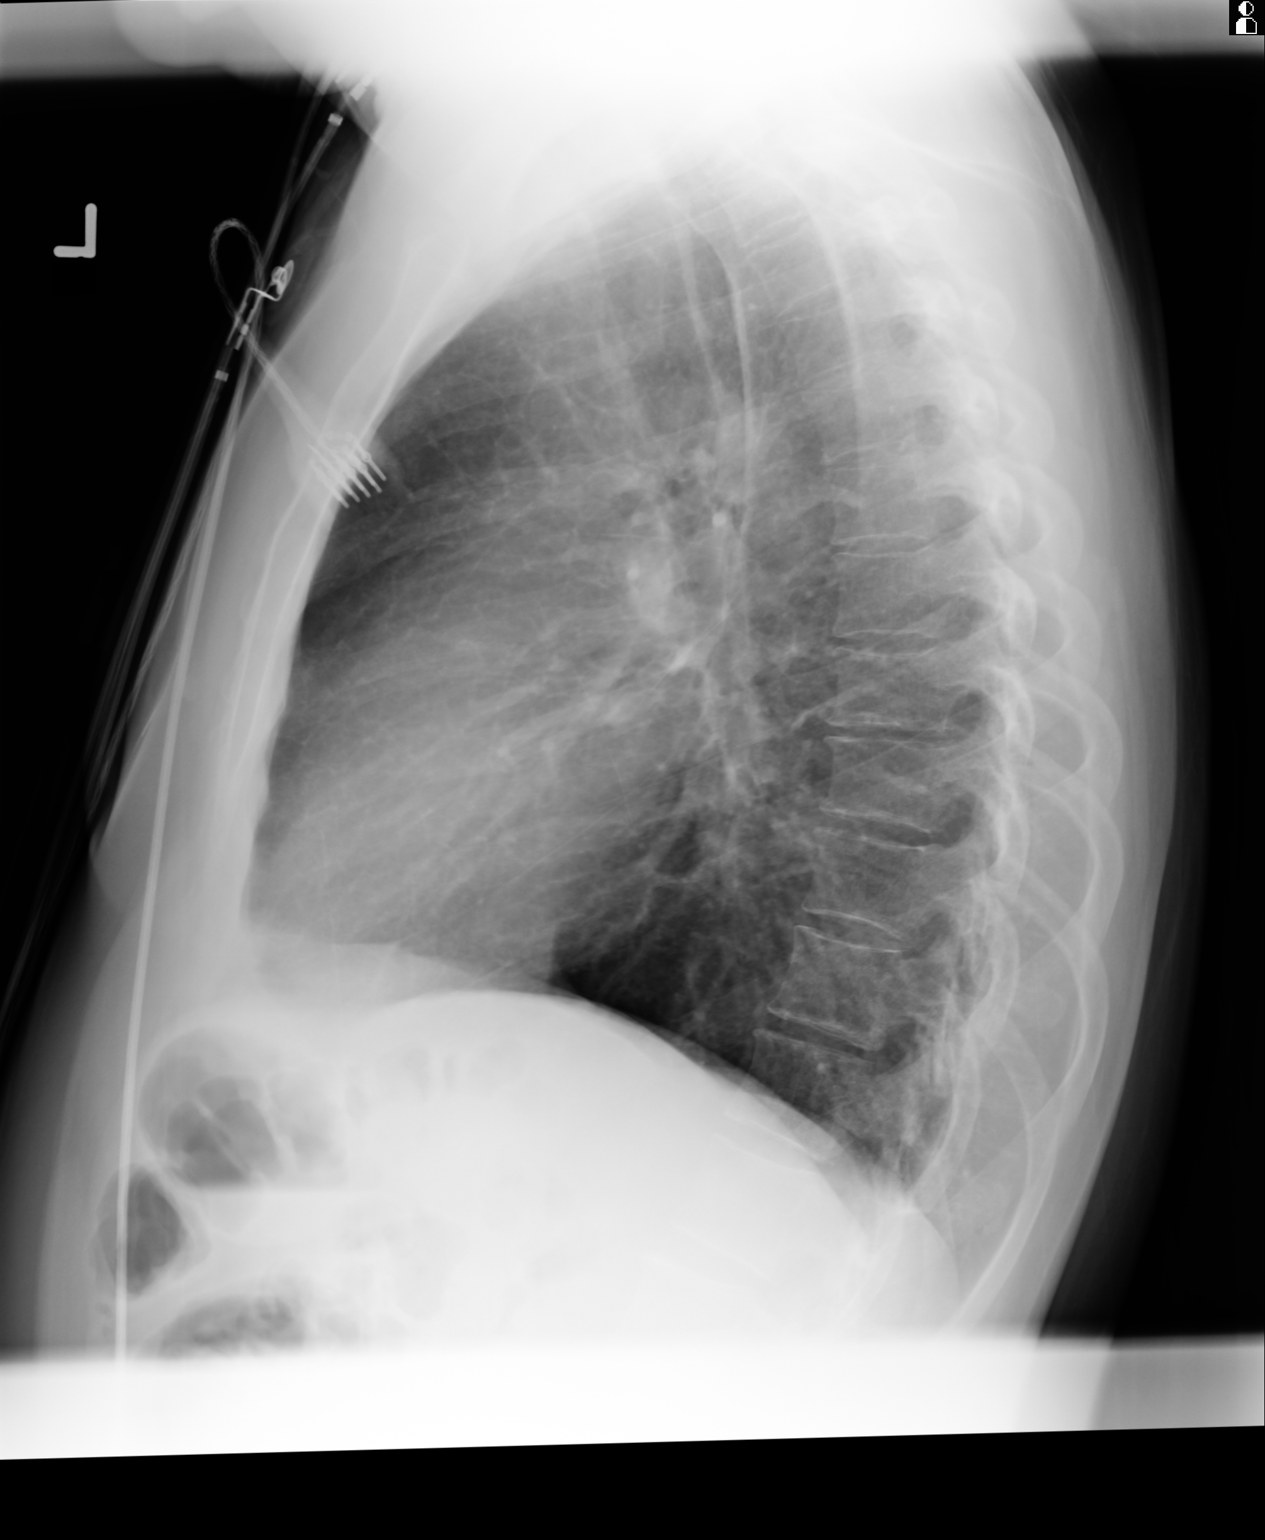

[2 of 2 positions shown; findings below may reference images not displayed]

FINDINGS: The heart and mediastinum are stable, given patient rotation to the right.
No focal pulmonary opacities.
IMPRESSION: No acute cardiopulmonary disease.

[REDACTED]

## 2013-03-25 ENCOUNTER — Emergency Department: Payer: Self-pay | Admitting: Emergency Medicine

## 2013-03-25 LAB — COMPREHENSIVE METABOLIC PANEL
Albumin: 4.2 g/dL (ref 3.4–5.0)
Alkaline Phosphatase: 91 U/L (ref 50–136)
Anion Gap: 7 (ref 7–16)
BUN: 3 mg/dL — ABNORMAL LOW (ref 7–18)
Bilirubin,Total: 0.5 mg/dL (ref 0.2–1.0)
Calcium, Total: 9.1 mg/dL (ref 8.5–10.1)
Chloride: 104 mmol/L (ref 98–107)
Co2: 25 mmol/L (ref 21–32)
Creatinine: 0.71 mg/dL (ref 0.60–1.30)
Glucose: 78 mg/dL (ref 65–99)
Osmolality: 267 (ref 275–301)
Potassium: 3.8 mmol/L (ref 3.5–5.1)
SGOT(AST): 35 U/L (ref 15–37)
SGPT (ALT): 34 U/L (ref 12–78)
Sodium: 136 mmol/L (ref 136–145)
Total Protein: 8.3 g/dL — ABNORMAL HIGH (ref 6.4–8.2)

## 2013-03-25 LAB — ACETAMINOPHEN LEVEL: Acetaminophen: <2 ug/mL

## 2013-03-25 LAB — DRUG SCREEN, URINE
Amphetamines, Ur Screen: NEGATIVE (ref ?–1000)
Barbiturates, Ur Screen: NEGATIVE (ref ?–200)
Benzodiazepine, Ur Scrn: NEGATIVE (ref ?–200)
Cannabinoid 50 Ng, Ur ~~LOC~~: NEGATIVE (ref ?–50)
Cocaine Metabolite,Ur ~~LOC~~: POSITIVE (ref ?–300)
MDMA (Ecstasy)Ur Screen: NEGATIVE (ref ?–500)
Methadone, Ur Screen: NEGATIVE (ref ?–300)
Opiate, Ur Screen: NEGATIVE (ref ?–300)
Phencyclidine (PCP) Ur S: NEGATIVE (ref ?–25)
Tricyclic, Ur Screen: NEGATIVE (ref ?–1000)

## 2013-03-25 LAB — CBC
HCT: 45.9 % (ref 40.0–52.0)
HGB: 16 g/dL (ref 13.0–18.0)
MCH: 34.3 pg — ABNORMAL HIGH (ref 26.0–34.0)
MCHC: 34.9 g/dL (ref 32.0–36.0)
MCV: 98 fL (ref 80–100)
Platelet: 368 10*3/uL (ref 150–440)
RBC: 4.67 10*6/uL (ref 4.40–5.90)
RDW: 12.8 % (ref 11.5–14.5)
WBC: 9.4 10*3/uL (ref 3.8–10.6)

## 2013-03-25 LAB — SALICYLATE LEVEL: Salicylates, Serum: 4 mg/dL — ABNORMAL HIGH

## 2013-03-25 LAB — TROPONIN I: Troponin-I: <0.02 ng/mL

## 2013-03-25 LAB — TSH: Thyroid Stimulating Horm: 0.524 u[IU]/mL

## 2013-03-25 LAB — ETHANOL
Ethanol %: <0.003 % (ref 0.000–0.080)
Ethanol: <3 mg/dL

## 2013-03-25 IMAGING — CR DG CHEST 1V PORT
1 series · 2 of 2 positions shown · non-contrast
Comparison: none

REASON FOR EXAM: chest pain
COMMENTS:

PROCEDURE:     DXR - DXR PORTABLE CHEST SINGLE VIEW  - [DATE]  [DATE]
RESULT:     Comparison: [DATE]

[Series 1: ap · 0.17mm/px · 2 of 2 slices shown]
[im 1/2]
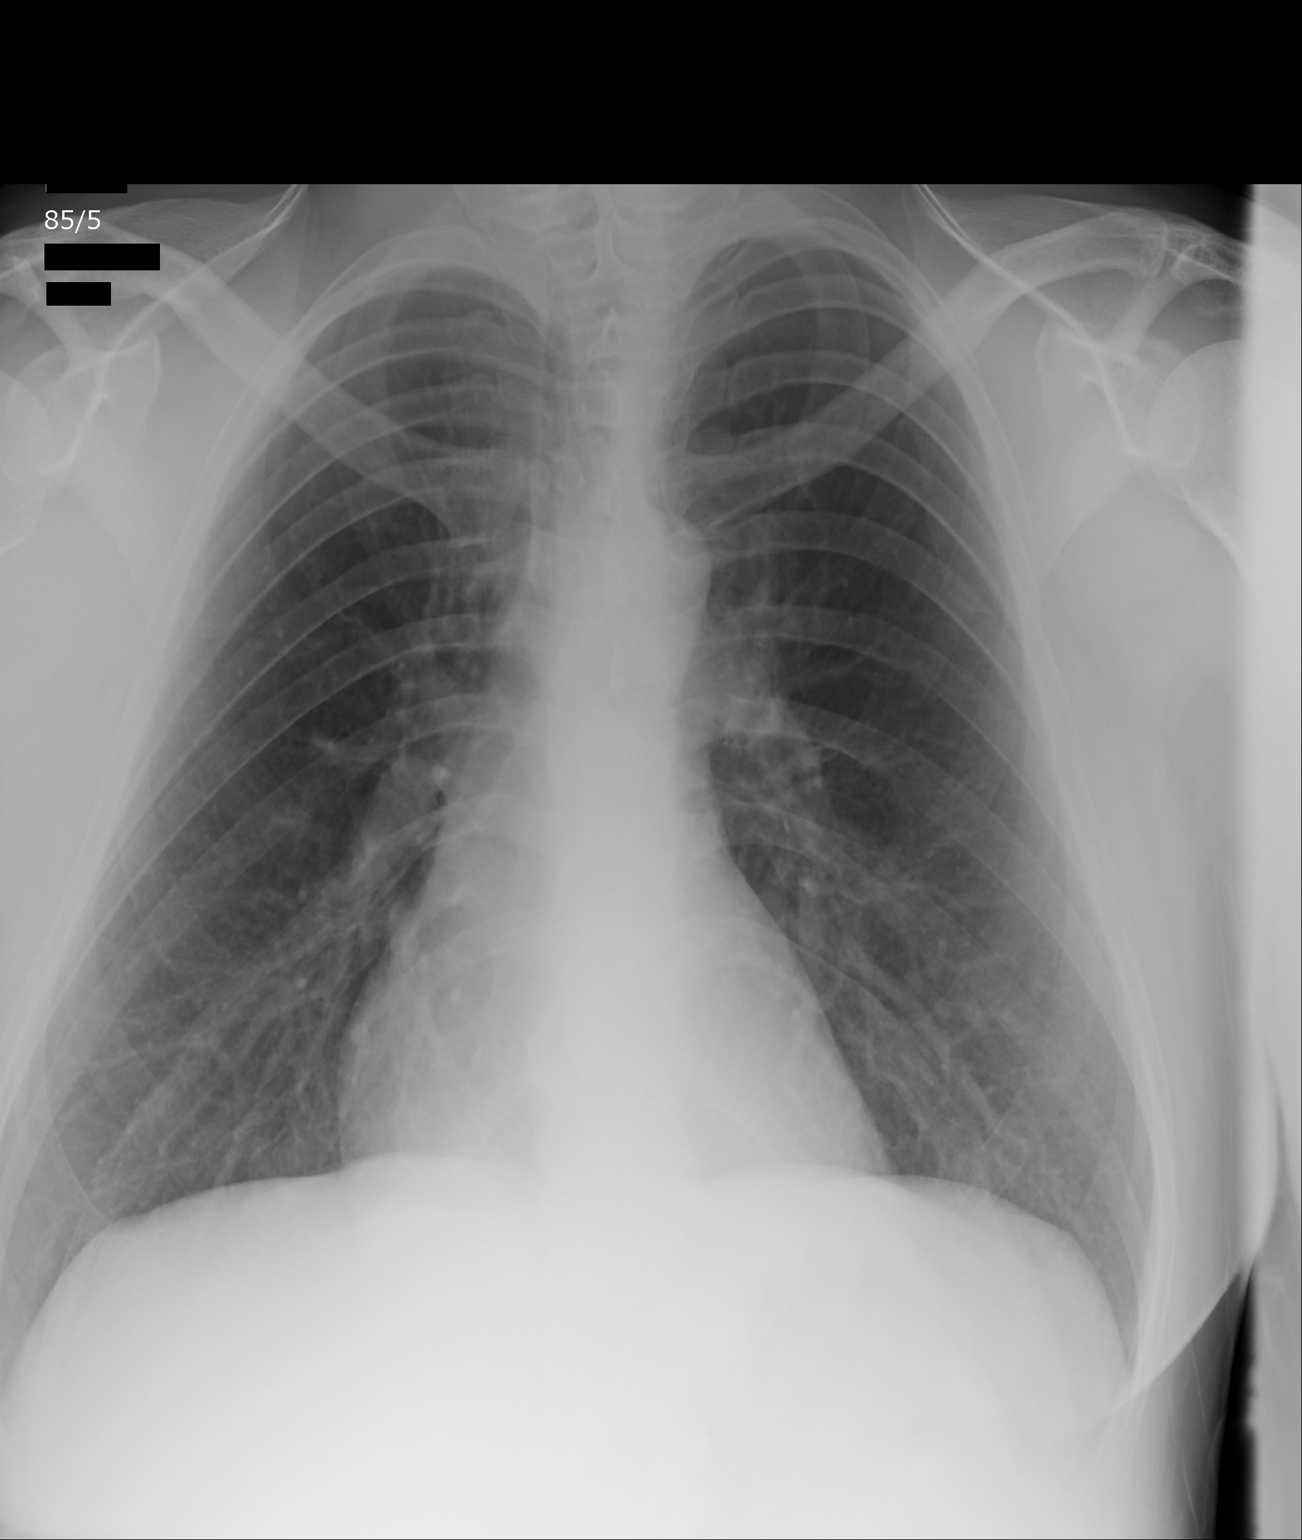
[im 2/2]
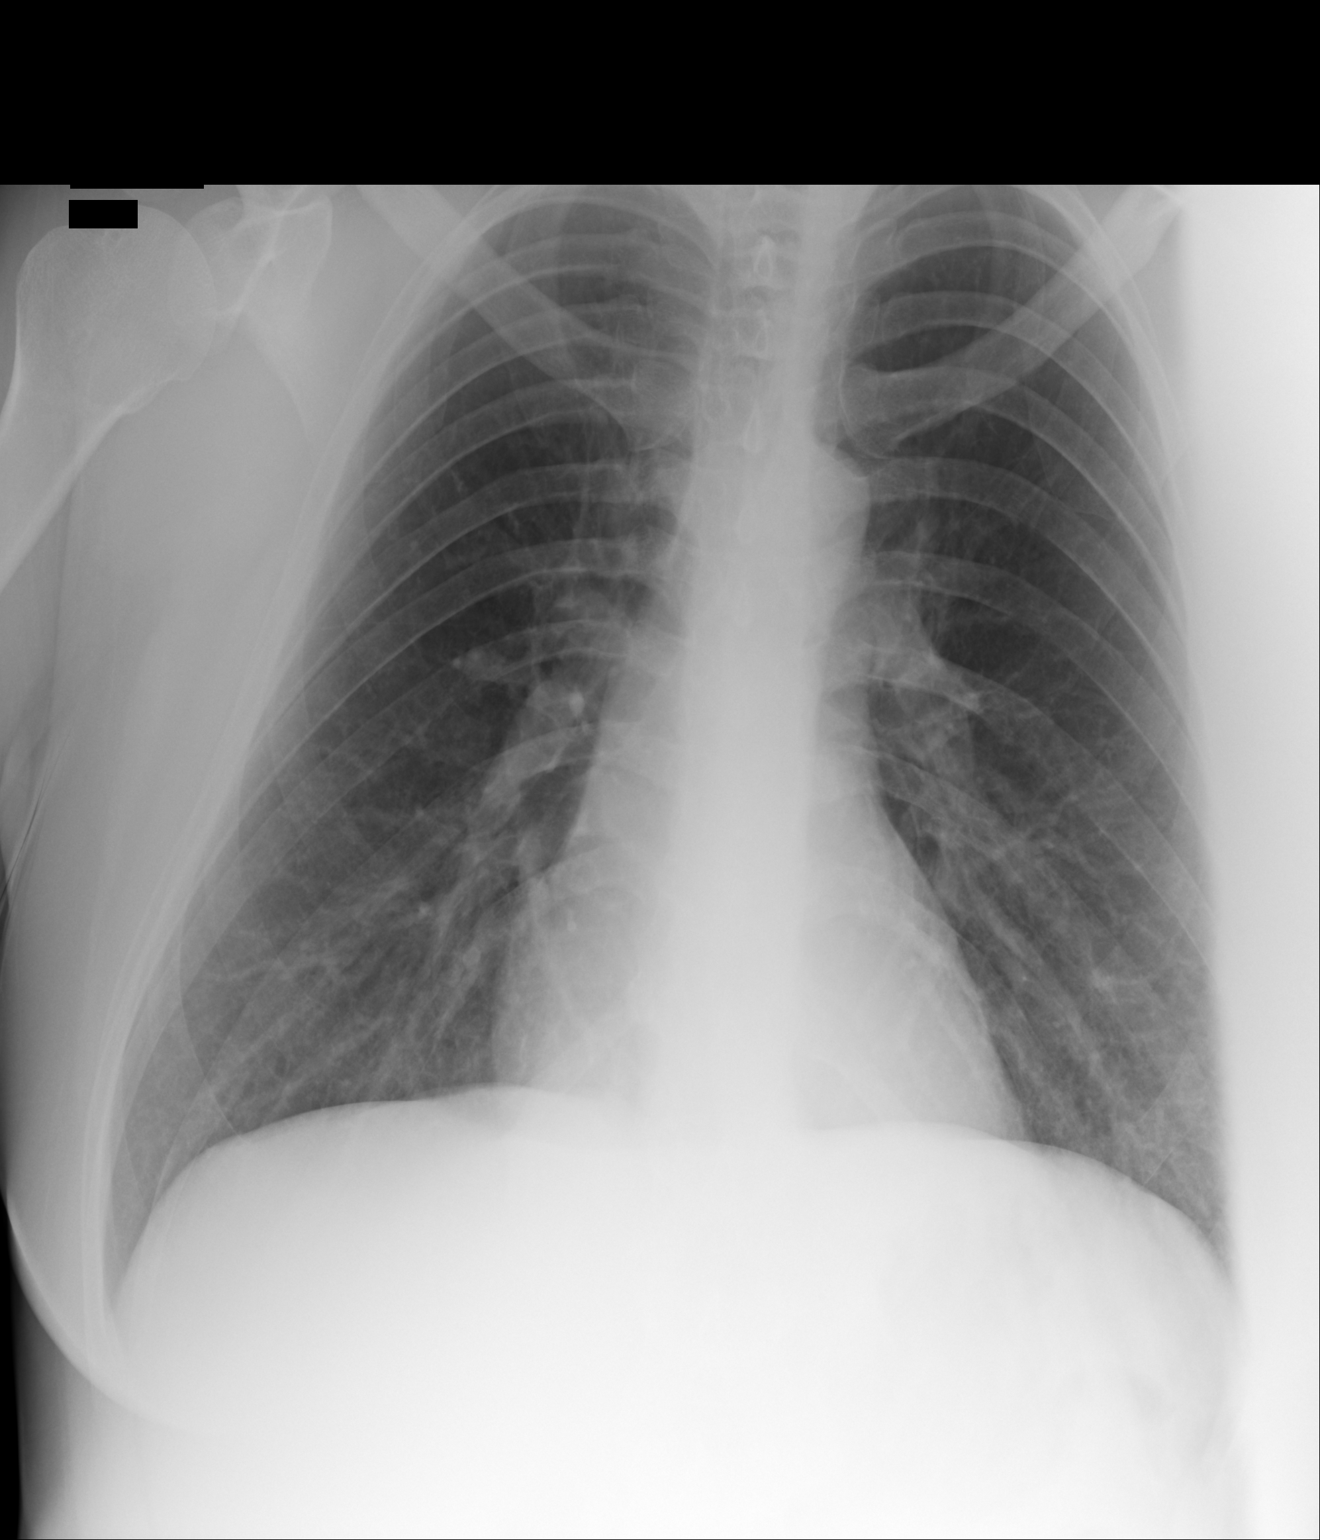

[2 of 2 positions shown; findings below may reference images not displayed]

FINDINGS: Single portable AP chest radiograph is provided.  There is no focal
parenchymal opacity, pleural effusion, or pneumothorax. Normal
cardiomediastinal silhouette. The osseous structures are unremarkable.
IMPRESSION: No acute disease of the che[REDACTED]

## 2013-04-06 ENCOUNTER — Encounter: Payer: Self-pay | Admitting: Nurse Practitioner

## 2013-06-14 ENCOUNTER — Telehealth: Payer: Self-pay | Admitting: *Deleted

## 2013-06-14 DIAGNOSIS — I2510 Atherosclerotic heart disease of native coronary artery without angina pectoris: Secondary | ICD-10-CM

## 2013-06-14 MED ORDER — PANTOPRAZOLE SODIUM 40 MG PO TBEC: 40.0000 mg | DELAYED_RELEASE_TABLET | Freq: Every day | ORAL | Status: DC

## 2013-06-14 MED ORDER — SIMVASTATIN 40 MG PO TABS: 40.0000 mg | ORAL_TABLET | Freq: Every day | ORAL | Status: DC

## 2013-06-14 NOTE — Telephone Encounter (Signed)
Received fax from [facility] requesting refills on Carvedilol, Lisinopril and NTG for 90 day supply with 3 refills. Also requesting change from Pravastatin to Simvastatin 40 mg daily and change from Nexium to Protonix 40 mg daily.  I spoke with Pharmacist and all these prescriptions can be provided to pt at no charge if Dr. Clifton James completes paperwork.  Changes approved by Dr. Clifton James and form completed. Form faxed to [employer].  Dr. Clifton James did not originally prescribe Nexium but has agreed to approve prescription for Protonix. Will update pt's med list to reflect these changes.

## 2013-06-15 ENCOUNTER — Telehealth: Payer: Self-pay | Admitting: Cardiovascular Disease

## 2013-06-15 NOTE — Telephone Encounter (Signed)
New problem  Pt states he needs to speak with Dr Clifton James.  I offered to make an appt because pt is due for one this month, pt declined. He said he just wants to speak with the doctor but would not give me an other information.

## 2013-06-15 NOTE — Telephone Encounter (Signed)
Spoke with pt who reports he has disability hearing coming up. He has letter written by Dr. Clifton James stating he was unable to work at that time. He is requesting updated letter. Phone call then became disconnected.  I tried to call pt back but received message pt is unavailable. No voicemail. Will continue to try to reach pt

## 2013-06-16 NOTE — Telephone Encounter (Signed)
Tried again to reach pt but received same message.

## 2013-06-16 NOTE — Telephone Encounter (Signed)
Tried again this AM to reach pt but received same message that he was unavailable.

## 2013-06-22 NOTE — Telephone Encounter (Signed)
Pt walked into office and requested to speak with Dr. Clifton James. I spoke with pt who states he had disability hearing today and lawyer is asking for additional information. He gave me copy of letter written on December 17, 2012 by Dr. Clifton James with handwritten note from lawyer on bottom of letter requesting additional information.  I told pt I would review note with Dr. Clifton James.   Pt also asking about medications through [facility]. I told him paperwork for him to receive medications through [employer] has been completed and faxed to their office and he should contact them for follow up on medications.

## 2013-06-24 ENCOUNTER — Encounter: Payer: Self-pay | Admitting: Cardiovascular Disease

## 2013-06-24 ENCOUNTER — Telehealth: Payer: Self-pay | Admitting: *Deleted

## 2013-06-24 DIAGNOSIS — R079 Chest pain, unspecified: Secondary | ICD-10-CM

## 2013-06-24 NOTE — Telephone Encounter (Signed)
Received request from [facility] for Dr. Clifton James to complete paperwork from [employer] for assistance program for Nexium. I called [employer] for clarification as Dr. Clifton James completed paperwork on June 14, 2013 from [facility] requesting switch from Protonix to Nexium.  I left message to call back. Number for [employer] is [phone].

## 2013-06-24 NOTE — Telephone Encounter (Signed)
Correction--previous paperwork from [facility] requested switch from Nexium to Prootnix. Paperwork noted that they were able to order Protonix from manufacturer in place of Nexium.

## 2013-06-24 NOTE — Telephone Encounter (Signed)
Dr. Clifton James has reviewed comments from lawyer. As Ethan Wells has not been seen in office since January Dr. Clifton James would like to wait until Ethan Wells's next office visit to see how Ethan Wells is feeling prior to writing another letter. I spoke with Ethan Wells and gave him this information.

## 2013-06-25 ENCOUNTER — Telehealth: Payer: Self-pay | Admitting: *Deleted

## 2013-06-25 NOTE — Telephone Encounter (Signed)
[employer] called again asking if patient should be on Protonix or Nexium, they state there already have a case onfile with Dennie Bible which is DR. McAlhany  Will route this to her to follow up with that issue [phone]

## 2013-06-28 MED ORDER — ESOMEPRAZOLE MAGNESIUM 40 MG PO CPDR: 40.0000 mg | DELAYED_RELEASE_CAPSULE | Freq: Every day | ORAL | Status: DC

## 2013-06-28 NOTE — Telephone Encounter (Signed)
Spoke with Keri at [facility]. Due to problems obtaining Protonix they are requesting change to Nexium 40 mg daily. Pt will receive this from [employer] through [employer] assistance program.  I told her I would have Dr. Clifton James complete paperwork for this. Upon updating med list message received of possible interaction between Celexa and Nexium.  I spoke with Keri at [facility] and she will research interaction and call me back.

## 2013-06-28 NOTE — Telephone Encounter (Signed)
Paperwork completed and mailed to [facility]

## 2013-06-28 NOTE — Telephone Encounter (Signed)
Spoke with pharmacist at [facility] and pt is no longer taking Celexa. He is taking Prozac. Will complete paperwork for Nexium assistance

## 2013-06-28 NOTE — Telephone Encounter (Signed)
See previous phone note for documentation regarding this.

## 2013-07-13 ENCOUNTER — Emergency Department: Payer: Self-pay | Admitting: Emergency Medicine

## 2013-07-14 ENCOUNTER — Telehealth: Payer: Self-pay | Admitting: *Deleted

## 2013-07-14 NOTE — Telephone Encounter (Signed)
Received paperwork from [facility] for Dr.McAlhany to complete requesting change from Nexium to Protonix 40 mg by mouth daily. I placed call for clarification of this request as paperwork was recently completed from [facility] requesting change to Protonix. Left message to call back

## 2013-07-15 NOTE — Telephone Encounter (Signed)
Spoke with Ethan Wells, pharmacy student at [facility] who reports request for change to Protonix should be discarded. Pt will continue on Nexium.

## 2013-08-30 ENCOUNTER — Encounter: Payer: Self-pay | Admitting: Cardiovascular Disease

## 2013-08-30 ENCOUNTER — Ambulatory Visit (INDEPENDENT_AMBULATORY_CARE_PROVIDER_SITE_OTHER): Payer: Self-pay | Admitting: Cardiovascular Disease

## 2013-08-30 ENCOUNTER — Encounter: Payer: Self-pay | Admitting: *Deleted

## 2013-08-30 VITALS — BP 124/68 | HR 62 | Ht 65.0 in | Wt 162.0 lb

## 2013-08-30 DIAGNOSIS — I2510 Atherosclerotic heart disease of native coronary artery without angina pectoris: Secondary | ICD-10-CM

## 2013-08-30 DIAGNOSIS — R002 Palpitations: Secondary | ICD-10-CM

## 2013-08-30 LAB — CBC WITH DIFFERENTIAL/PLATELET
Basophils Absolute: 0 10*3/uL (ref 0.0–0.1)
Basophils Relative: 0.3 % (ref 0.0–3.0)
Eosinophils Absolute: 0.1 10*3/uL (ref 0.0–0.7)
Eosinophils Relative: 0.9 % (ref 0.0–5.0)
HCT: 39.6 % (ref 39.0–52.0)
Hemoglobin: 13.8 g/dL (ref 13.0–17.0)
Lymphocytes Relative: 18.2 % (ref 12.0–46.0)
Lymphs Abs: 2.6 10*3/uL (ref 0.7–4.0)
MCHC: 34.8 g/dL (ref 30.0–36.0)
MCV: 94.9 fl (ref 78.0–100.0)
Monocytes Absolute: 1 10*3/uL (ref 0.1–1.0)
Monocytes Relative: 7.1 % (ref 3.0–12.0)
Neutro Abs: 10.3 10*3/uL — ABNORMAL HIGH (ref 1.4–7.7)
Neutrophils Relative %: 73.5 % (ref 43.0–77.0)
Platelets: 312 10*3/uL (ref 150.0–400.0)
RBC: 4.17 Mil/uL — ABNORMAL LOW (ref 4.22–5.81)
RDW: 14.3 % (ref 11.5–14.6)
WBC: 14.1 10*3/uL — ABNORMAL HIGH (ref 4.5–10.5)

## 2013-08-30 LAB — PROTIME-INR
INR: 1 ratio (ref 0.8–1.0)
Prothrombin Time: 10.5 s (ref 10.2–12.4)

## 2013-08-30 LAB — BASIC METABOLIC PANEL
BUN: 5 mg/dL — ABNORMAL LOW (ref 6–23)
CO2: 27 meq/L (ref 19–32)
Calcium: 9.4 mg/dL (ref 8.4–10.5)
Chloride: 105 meq/L (ref 96–112)
Creatinine, Ser: 0.7 mg/dL (ref 0.4–1.5)
GFR: 129.85 mL/min (ref >60.00)
Glucose, Bld: 81 mg/dL (ref 70–99)
Potassium: 3.8 meq/L (ref 3.5–5.1)
Sodium: 137 mEq/L (ref 135–145)

## 2013-08-30 NOTE — Patient Instructions (Addendum)
Your physician recommends that you schedule a follow-up appointment in:  About 6 weeks.   Your physician has recommended that you wear a holter monitor. Holter monitors are medical devices that record the heart's electrical activity. Doctors most often use these monitors to diagnose arrhythmias. Arrhythmias are problems with the speed or rhythm of the heartbeat. The monitor is a small, portable device. You can wear one while you do your normal daily activities. This is usually used to diagnose what is causing palpitations/syncope (passing out).  Coronary Angiography Coronary angiography is an X-ray procedure used to look at the arteries in the heart. In this procedure, a dye is injected through a long, hollow tube (catheter). The catheter is about the size of a piece of cooked spaghetti. The catheter injects a dye into an artery in your groin. X-rays are then taken to show if there is a blockage in the arteries of your heart. BEFORE THE PROCEDURE   Let your caregiver know if you have allergies to shellfish or contrast dye. Also let your caregiver know if you have kidney problems or failure.  Do not eat or drink starting from midnight up to the time of the procedure, or as directed.  You may drink enough water to take your medications the morning of the procedure if you were instructed to do so.  You should be at the hospital or outpatient facility where the procedure is to be done 60 minutes prior to the procedure or as directed. PROCEDURE  You may be given an IV medication to help you relax before the procedure.  You will be prepared for the procedure by washing and shaving the area where the catheter will be inserted. This is usually done in the groin but may be done in the fold of your arm by your elbow.  A medicine will be given to numb your groin where the catheter will be inserted.  A specially trained doctor will insert the catheter into an artery in your groin. The catheter is guided  by using a special type of X-ray (fluoroscopy) to the blood vessel being examined.  A special dye is then injected into the catheter and X-rays are taken. The dye helps to show where any narrowing or blockages are located in the heart arteries. AFTER THE PROCEDURE   After the procedure you will be kept in bed lying flat for several hours. You will be instructed to not bend or cross your legs.  The groin insertion site will be watched and checked frequently.  The pulse in your feet will be checked frequently.  Additional blood tests, X-rays and an EKG may be done.  You may stay in the hospital overnight for observation. SEEK IMMEDIATE MEDICAL CARE IF:   You develop chest pain, shortness of breath, feel faint, or pass out.  There is bleeding, swelling, or drainage from the catheter insertion site.  You develop pain, discoloration, coldness, or severe bruising in the leg or area where the catheter was inserted.  You have a fever. Document Released: 06/08/2003 Document Revised: 02/24/2012 Document Reviewed: 07/27/2008 [facility] Patient Information 2014 [place], [place].  Your physician has requested that you have a cardiac catheterization. Cardiac catheterization is used to diagnose and/or treat various heart conditions. Doctors may recommend this procedure for a number of different reasons. The most common reason is to evaluate chest pain. Chest pain can be a symptom of coronary artery disease (CAD), and cardiac catheterization can show whether plaque is narrowing or blocking your heart's arteries.  This procedure is also used to evaluate the valves, as well as measure the blood flow and oxygen levels in different parts of your heart. For further information please visit [email or web address] Please follow instruction sheet, as given. Scheduled for September 06, 2013

## 2013-08-30 NOTE — Progress Notes (Signed)
History of Present Illness: 42 yo WM with history of tobacco abuse, cocaine abuse, CAD, ischemic cardiomyopathy here today for follow up. He was admitted to [facility] 02/06/11 with acute anterior wall STEMI. HIs LAD was occluded. A bare metal stent was placed. EF of 25-30% by echo with akinesis of the anteroseptal wall and apex with f/u echo 7/12 with LVEF=45-50%.  He had been non-compliant with medications. He was admitted to [facility] 11/16/12 with chest pain. EKG was unchanged. Cardiac markers negative. Stress myoview low risk with possible anteroseptal ischemia. He was discharged home without invasive workup. He was seen as an inpatient by Psychiatry and Depakote and Buspar were started. He did not fill the Depakote due to cost.   He is here today for hospital follow up. He describes skipping heart beats. This happens every day. He has also been having severe chest pains. Very similar to how he felt before his MI. Occur with exertion. He continues to smoke 1/2 ppd. No cocaine use since April. He went through rehab. He also has cramps in his legs.   Primary Care Physician:  None  Last Lipid Profile:Lipid Panel     Component Value Date/Time   CHOL 252* 12/29/2012 1250   TRIG 154* 12/29/2012 1250   HDL 59 12/29/2012 1250   CHOLHDL 4.3 12/29/2012 1250   VLDL 31 12/29/2012 1250   LDLCALC 162* 12/29/2012 1250     Past Medical History  Diagnosis Date  . CAD (coronary artery disease)     a. 01/2011 Anterior STEMI/Cath/PCI: LM nl, LAD 100d (3.5x44mm Vision BMS placed), LCX 63m, RI 50, RCA min irregs, EF 40% w/ apical, inferoapical HK.  Marland Kitchen Hypertension   . Cardiac arrest - ventricular fibrillation     a. In setting of STEMI 01/2011  . Ischemic cardiomyopathy     a. 06/2011 Echo: EF 45-50%, No rwma  . Tobacco abuse     a. 1/2 ppd x 26 yrs  . ETOH abuse     a. 6-12 beers/day  . Hemorrhoids   . Cocaine abuse   . Marijuana abuse     No past surgical history on file.  Current  Outpatient Prescriptions  Medication Sig Dispense Refill  . aspirin 81 MG tablet Take 1 tablet (81 mg total) by mouth daily.  30 tablet  6  . busPIRone (BUSPAR) 10 MG tablet Take 1 tablet (10 mg total) by mouth 3 (three) times daily.  90 tablet  1  . carvedilol (COREG) 6.25 MG tablet Take 1 tablet (6.25 mg total) by mouth 2 (two) times daily.  60 tablet  11  . FLUoxetine (PROZAC) 40 MG capsule Take 40 mg by mouth daily.      Marland Kitchen GABAPENTIN, PHN, PO Take by mouth as needed.      Marland Kitchen lisinopril (PRINIVIL,ZESTRIL) 5 MG tablet Take 1 tablet (5 mg total) by mouth daily.  30 tablet  11  . nitroGLYCERIN (NITROSTAT) 0.4 MG SL tablet Place 0.4 mg under the tongue every 5 (five) minutes as needed. For chest pain. May repeat for up to 3 doses.      . pantoprazole (PROTONIX) 40 MG tablet Take 40 mg by mouth daily.      . simvastatin (ZOCOR) 40 MG tablet Take 1 tablet (40 mg total) by mouth at bedtime.  90 tablet  3   No current facility-administered medications for this visit.    Allergies  Allergen Reactions  . Penicillins Hives    History  Social History  . Marital Status: Single    Spouse Name: N/A    Number of Children: N/A  . Years of Education: N/A   Occupational History  . Not on file.   Social History Main Topics  . Smoking status: Smoker, Current Status Unknown -- 0.50 packs/day for 26 years  . Smokeless tobacco: Not on file  . Alcohol Use: Yes     Comment: at least a 6 pack of beer daily and often a 12 pack  . Drug Use: Yes    Special: Cocaine, Marijuana     Comment: used cocaine about 10 days ago.  Marland Kitchen Sexual Activity: Not on file   Other Topics Concern  . Not on file   Social History Narrative   Single, lives in [place] with his mother.  He does not routinely exercise.  He sometimes works, helping to deliver appliances to homes.    Family History  Problem Relation Age of Onset  . Coronary artery disease Mother     alive  . Other      2 sisters alive and well  .  Alcohol abuse Father     died @ 36    Review of Systems:  As stated in the HPI and otherwise negative.   BP 124/68  Pulse 62  Ht 5\' 5"  (1.651 m)  Wt 162 lb (73.483 kg)  BMI 26.96 kg/m2  SpO2 98%  Physical Examination: General: Well developed, well nourished, NAD HEENT: OP clear, mucus membranes moist SKIN: warm, dry. No rashes. Neuro: No focal deficits Musculoskeletal: Muscle strength 5/5 all ext Psychiatric: Mood and affect normal Neck: No JVD, no carotid bruits, no thyromegaly, no lymphadenopathy. Lungs:Clear bilaterally, no wheezes, rhonci, crackles Cardiovascular: Regular rate and rhythm. No murmurs, gallops or rubs. Abdomen:Soft. Bowel sounds present. Non-tender.  Extremities: No lower extremity edema. Pulses are 2 + in the bilateral DP/PT.  Stress test 11/17/12:  Images reconstructed in the vertical , horizontal and short axis planes. QPS was 6 abnormal at the apex. EF normal 52% with no RWMA;s. There was a small area of anterior septal ischemia at the apex. Impression: Abnormal but low risk myovue. Small area of antero septal ischemia at the apex. No infarct EF 52%  Assessment and Plan:   1. CAD: Recent chest pains c/w unstable angina. He is asked to stop smoking. He has stopped using cocaine. Will arrange left heart cath with possible PCI next week. Pre-cath labs today. R/B reviewed with pt.   2. Palpitations: Likely PVCs. Will get 48 hour Holter monitor.

## 2013-09-06 ENCOUNTER — Ambulatory Visit (HOSPITAL_COMMUNITY): Admission: RE | Admit: 2013-09-06 | Payer: Self-pay | Source: Ambulatory Visit | Admitting: Cardiovascular Disease

## 2013-09-06 ENCOUNTER — Encounter (HOSPITAL_COMMUNITY): Admission: RE | Payer: Self-pay | Source: Ambulatory Visit

## 2013-09-06 ENCOUNTER — Inpatient Hospital Stay: Payer: Self-pay | Admitting: Internal Medicine

## 2013-09-06 LAB — DRUG SCREEN, URINE
Amphetamines, Ur Screen: NEGATIVE (ref ?–1000)
Barbiturates, Ur Screen: NEGATIVE (ref ?–200)
Benzodiazepine, Ur Scrn: NEGATIVE (ref ?–200)
Cannabinoid 50 Ng, Ur ~~LOC~~: POSITIVE (ref ?–50)
Cocaine Metabolite,Ur ~~LOC~~: POSITIVE (ref ?–300)
MDMA (Ecstasy)Ur Screen: NEGATIVE (ref ?–500)
Methadone, Ur Screen: NEGATIVE (ref ?–300)
Opiate, Ur Screen: POSITIVE (ref ?–300)
Phencyclidine (PCP) Ur S: NEGATIVE (ref ?–25)
Tricyclic, Ur Screen: NEGATIVE (ref ?–1000)

## 2013-09-06 LAB — COMPREHENSIVE METABOLIC PANEL
Albumin: 4 g/dL (ref 3.4–5.0)
Alkaline Phosphatase: 108 U/L (ref 50–136)
Anion Gap: 9 (ref 7–16)
BUN: 4 mg/dL — ABNORMAL LOW (ref 7–18)
Bilirubin,Total: 0.5 mg/dL (ref 0.2–1.0)
Calcium, Total: 8.9 mg/dL (ref 8.5–10.1)
Chloride: 102 mmol/L (ref 98–107)
Co2: 23 mmol/L (ref 21–32)
Creatinine: 0.78 mg/dL (ref 0.60–1.30)
EGFR (African American): >60
EGFR (Non-African Amer.): >60
Glucose: 167 mg/dL — ABNORMAL HIGH (ref 65–99)
Osmolality: 269 (ref 275–301)
Potassium: 3.5 mmol/L (ref 3.5–5.1)
SGOT(AST): 24 U/L (ref 15–37)
SGPT (ALT): 21 U/L (ref 12–78)
Sodium: 134 mmol/L — ABNORMAL LOW (ref 136–145)
Total Protein: 8.2 g/dL (ref 6.4–8.2)

## 2013-09-06 LAB — CBC
HCT: 42.2 % (ref 40.0–52.0)
HGB: 15 g/dL (ref 13.0–18.0)
MCH: 33.6 pg (ref 26.0–34.0)
MCHC: 35.5 g/dL (ref 32.0–36.0)
MCV: 95 fL (ref 80–100)
Platelet: 270 10*3/uL (ref 150–440)
RBC: 4.45 10*6/uL (ref 4.40–5.90)
RDW: 13.8 % (ref 11.5–14.5)
WBC: 7.2 10*3/uL (ref 3.8–10.6)

## 2013-09-06 LAB — TROPONIN I: Troponin-I: <0.02 ng/mL

## 2013-09-06 IMAGING — CR DG CHEST 2V
1 series · 2 of 2 positions shown · non-contrast
Comparison: none

REASON FOR EXAM: Chest Pain
COMMENTS:

[Series 1: w chest pa · 0.14mm/px · 2 of 2 slices shown]
[im 1/2]
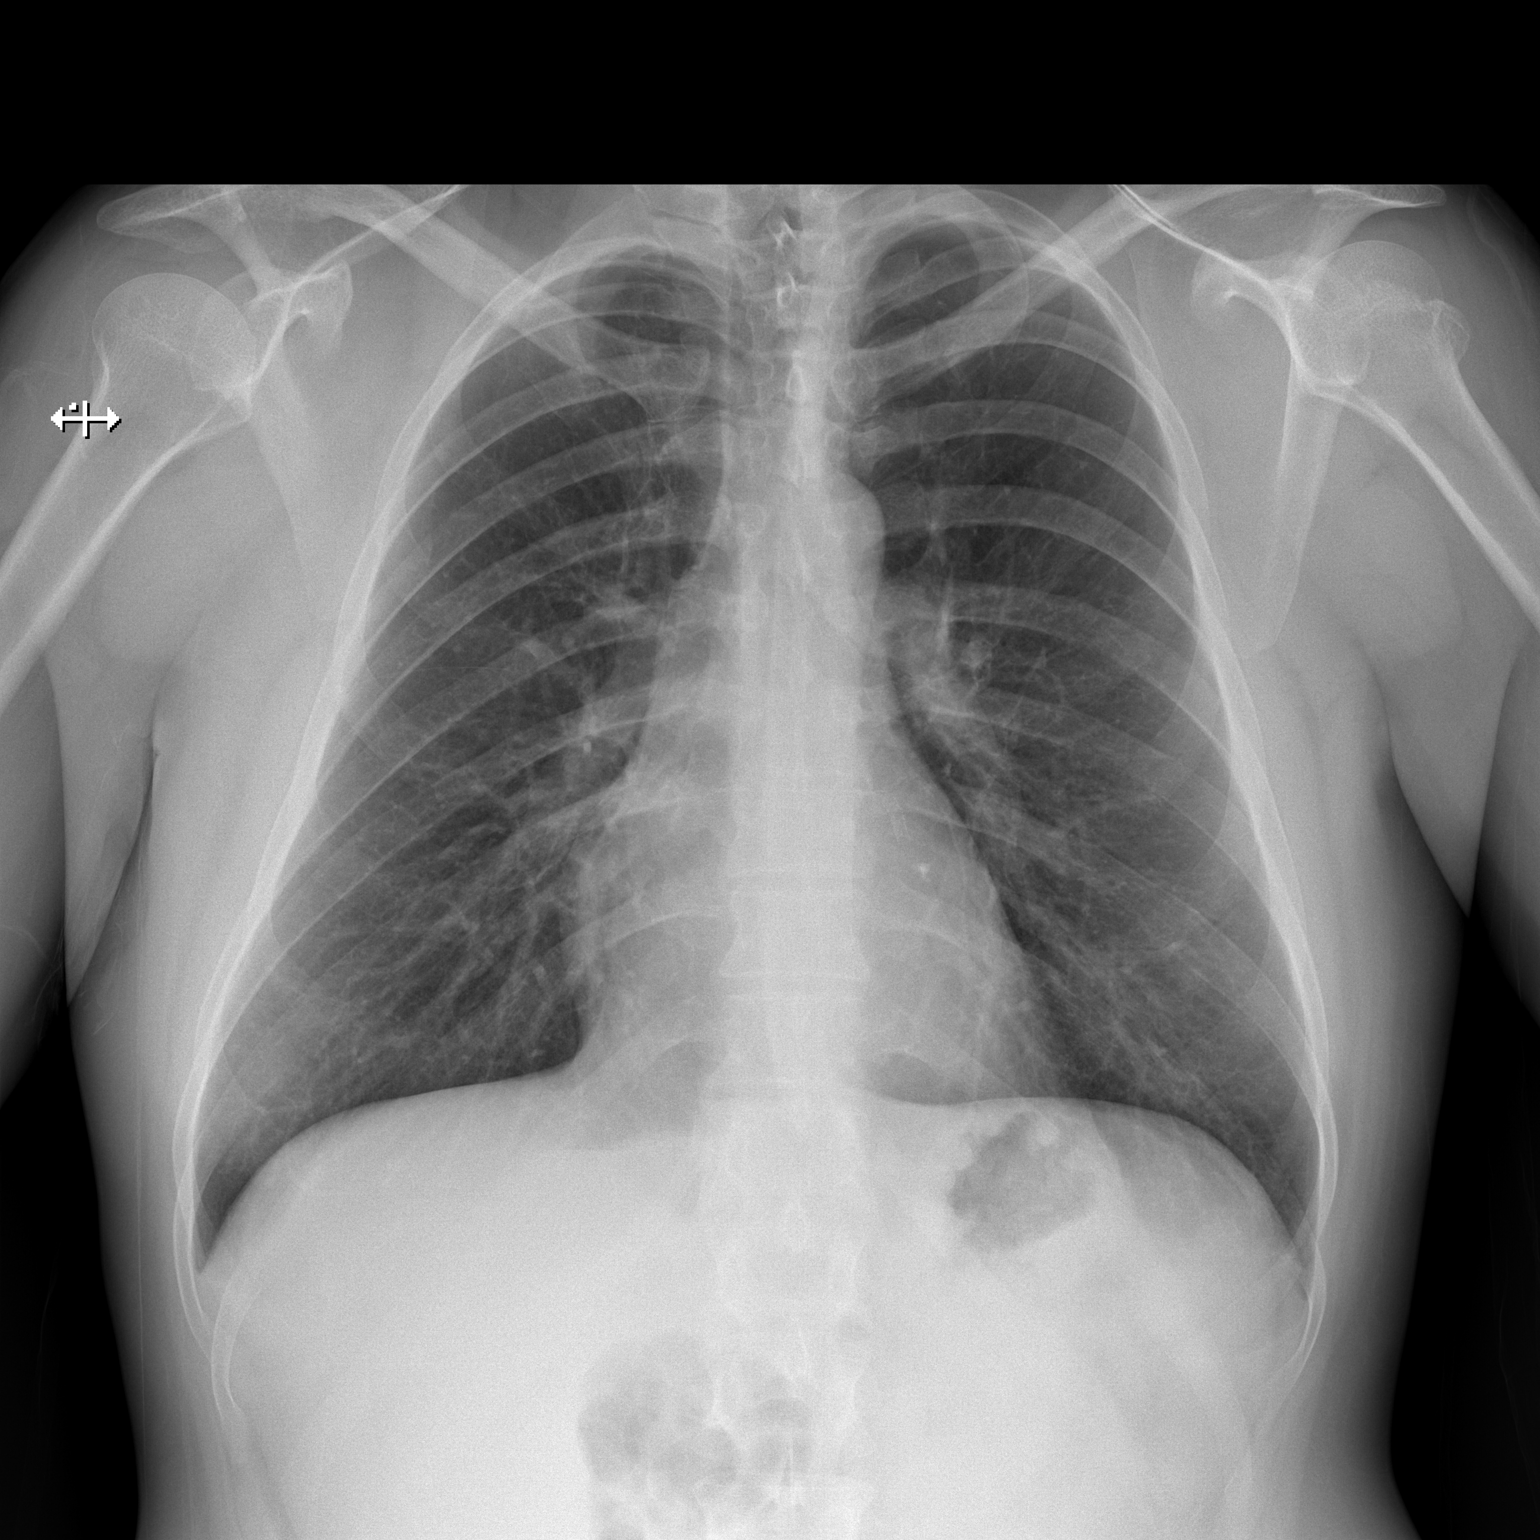
[im 2/2]
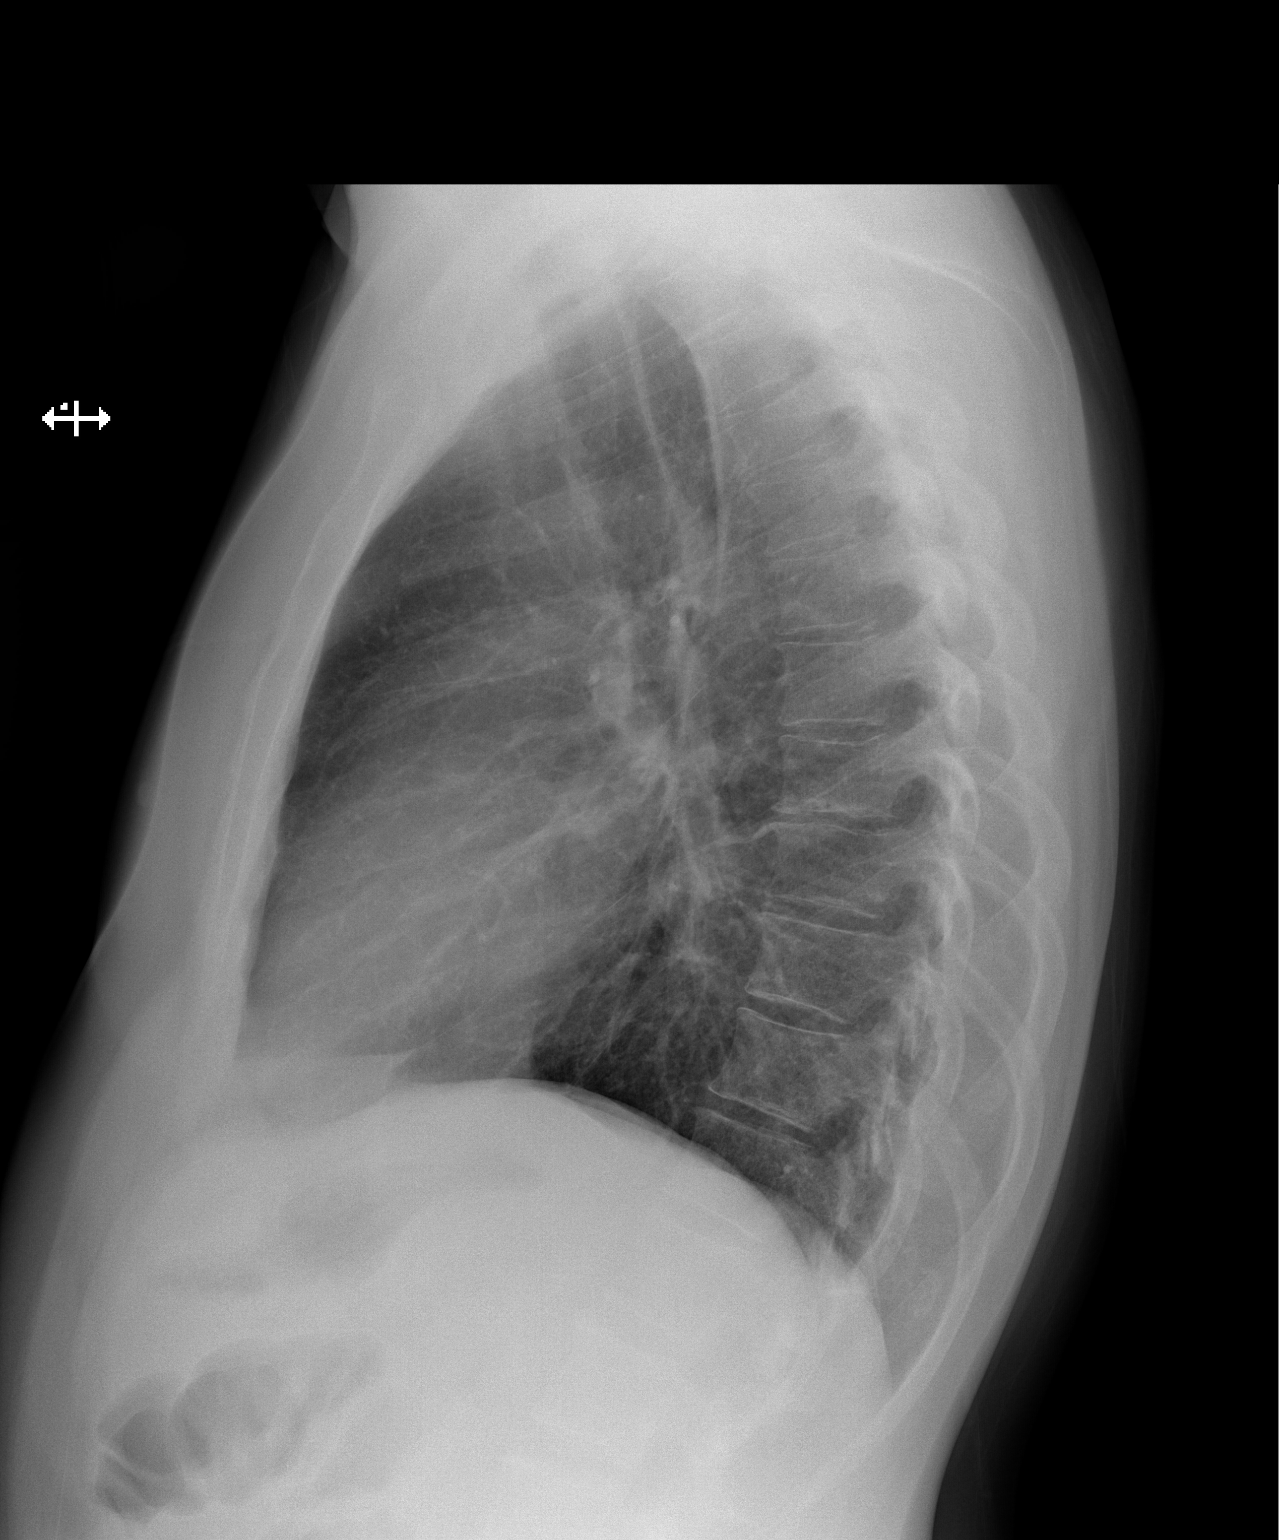

[2 of 2 positions shown; findings below may reference images not displayed]

PROCEDURE:     DXR - DXR CHEST PA (OR AP) AND LATERAL  - [DATE]  [DATE]

RESULT:     Comparison is made to the previous exam dated [DATE]. The
lungs are clear. The heart and pulmonary vessels are normal. The bony and
mediastinal structures are unremarkable. There is no effusion. There is no
pneumothorax or evidence of congestive failure.
IMPRESSION: No acute cardiopulmonary disease. Stable appearance.

[REDACTED]

## 2013-09-06 SURGERY — LEFT HEART CATHETERIZATION WITH CORONARY ANGIOGRAM
Anesthesia: LOCAL

## 2013-09-07 ENCOUNTER — Telehealth: Payer: Self-pay | Admitting: Cardiovascular Disease

## 2013-09-07 NOTE — Telephone Encounter (Signed)
Left message for pt to call back. Per Dr. Clifton James can be rescheduled for September 15, 2013. If done on October 1 does not need repeat labs

## 2013-09-07 NOTE — Telephone Encounter (Signed)
Pt notified. We are unable to schedule appt at this time due to computer changes with scheduling of appts. I told pt I would contact him in next week or so to set up appt with Dr. Clifton James. Pt can be reached at number above. He also reports [phone] as alternate contact number.

## 2013-09-07 NOTE — Telephone Encounter (Signed)
We need to postpone cath for several weeks until he completes antibiotics. Will need to see him back in 3 weeks in office to discuss again. CDM
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
## 2013-09-07 NOTE — Telephone Encounter (Signed)
New Prob  Pt states he would like to reschedule his cath procedure.

## 2013-09-15 NOTE — Telephone Encounter (Signed)
Pt has previously scheduled appt on October 26, 2013. I spoke to him and he will keep this appt to discuss rescheduling of cath.

## 2013-09-25 ENCOUNTER — Emergency Department: Payer: Self-pay | Admitting: Emergency Medicine

## 2013-10-11 ENCOUNTER — Telehealth: Payer: Self-pay | Admitting: *Deleted

## 2013-10-11 MED ORDER — ESOMEPRAZOLE MAGNESIUM 40 MG PO CPDR: 40.0000 mg | DELAYED_RELEASE_CAPSULE | Freq: Every day | ORAL | Status: DC

## 2013-10-11 NOTE — Telephone Encounter (Signed)
Message copied by Carmela Hurt on Mon Oct 11, 2013  3:02 PM ------      Message from: Verne Carrow D      Created: Mon Oct 11, 2013  9:05 AM      Regarding: RE: PROTONIX       We can switch to Nexium. Thanks, chris            ----- Message -----         From: Carmela Hurt, RN         Sent: 10/11/2013   8:59 AM           To: Kathleene Hazel, MD, #      Subject: PROTONIX                                                 Dr Clifton James, received letter from pharmacy, protonix no longer available on patients plan, they suggest nexium, can we change. Thanks Addison Lank, RN Patient Care Advocate       ------

## 2013-10-26 ENCOUNTER — Ambulatory Visit: Payer: Self-pay | Admitting: Cardiovascular Disease

## 2013-11-03 NOTE — Telephone Encounter (Signed)
Pt cancelled appt with Dr. Clifton James for October 26, 2013. Schedulers have tried several times to reach pt to see if he wants to reschedule but have been unable to reach him.  Cancellation note indicates pt cancelled appt because he was moving.
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
## 2015-04-07 NOTE — Discharge Summary (Signed)
PATIENT NAME:  Ethan Wells, Ethan Wells MR#:  [id number] DATE OF BIRTH:  [birth year]  DATE OF ADMISSION:  09/06/2013 DATE OF DISCHARGE: 09/07/2013   DISCHARGE DIAGNOSES:  1.  Left hand cellulitis, resolved. 2.  Musculoskeletal chest pain.  3.  Coronary artery disease with bare-metal stent.  4.  History of methicillin-resistant Staphylococcus aureus infection.  5.  Polysubstance abuse.  6.  Diet-controlled diabetes.  DISCHARGE MEDICATIONS:  1.  Lisinopril 10 mg p.o. daily. 2.  BuSpar 10 mg p.o. t.i.d.  3.  Neurontin 300 mg p.o. t.i.d. 4.  Pravastatin 40 mg p.o. daily.  5.  Protonix 40 mg p.o. daily.  6.  Bactrim DS 1 tablet p.o. b.i.d. for 10 days.  7.  Metoprolol 25 mg half tablet once daily.  Bactrim and metoprolol are new.   DIET: Low sodium, low fat diet.   CONSULTATIONS: None.  FOLLOWUP: With primary doctor in 1 to 2 weeks.   HOSPITAL COURSE:  1.  The patient is a 44 year old male with history of cocaine abuse, came in because of left arm pain and left chest pain. Look at the history and physical done by Dr. Darvin Neighbours for details. The patient noted to have a small penetrating wound in the forearm and he was aware of that and then redness spread along his arm and the patient also had some chest pain, which was like musculoskeletal pain, which happened after his friend gave him a tight hug. The patient has a history of MRSA and also history of bare metal stent placed in 2012. Admitted for lymphangitis and cellulitis. He was started on vanc because of history of MRSA. The patient's white count on admission was 7.2 and he is afebrile since admission also, and the patient's redness improved and he really does not have any more redness and the patient will continue Bactrim for 10 days to finish the course.  2.  Polysubstance abuse. Counseled again with that, and the patient's urine toxicology positive for cocaine and also cannabinoids. The patient can use Motrin for his chest pain.  3.  Chest  pain, thought to be musculoskeletal. Troponins were negative. EKG no new changes. The patient's chest x-ray also is clear and the patient is started on Toradol here. I told him to continue Advil or Motrin for his pain 200 mg every 6 hours p.r.n.  4.  The patient's other diagnoses include diet-controlled diabetes (glucose is around 96 here), hypertension, history of coronary artery disease. He takes aspirin and ACE inhibitors. I added small dose beta blocker for his heart disease. The patient is stable for discharge home.   TIME SPENT ON DISCHARGE PREPARATION: More than 30 minutes.  ____________________________ Epifanio Lesches, MD sk:aw D: 09/07/2013 09:49:21 ET T: 09/07/2013 10:21:13 ET JOB#: [id number]  cc: Epifanio Lesches, MD, <Dictator> Epifanio Lesches MD ELECTRONICALLY SIGNED 09/24/2013 14:51

## 2015-04-07 NOTE — Consult Note (Signed)
General Aspect primary cardiologist: Marius Ditch, MD.   This is a 44 year old WM with history of tobacco abuse, cocaine abuse with admission to [facility] 02/06/11 with acute anterior wall STEMI. His LAD was occluded. A bare metal stent was placed. EF of 25-30% by echo with akinesis of the anteroseptal wall and apex. He had been non-compliant with medications. He was admitted to [facility] 11/16/12 with chest pain. EKG was unchanged. Cardiac markers negative. Stress myoview low risk with possible small area of distal anteroseptal ischemia. He was discharged home without invasive workup. He was seen as an inpatient by Psychiatry and Depakote and Buspar were started. He did not fill the Depakote due to cost.  He was seen by his cardiologist in [place] was prescribed small dose Coreg, lisinopril and lovastatin. The patient did not fill these medications from pharmacy due to financial reasons. He continues to smoke. He also admits to recent cocaine use. He presented this morning with acute substernal chest tightness which lasted for a few hours. Cardiac enzymes are negative and EKG did not show any acute changes.   Physical Exam:  GEN no acute distress   NECK supple  No masses   RESP normal resp effort  clear BS   CARD Regular rate and rhythm  No murmur   ABD denies tenderness   LYMPH negative neck   EXTR negative cyanosis/clubbing, negative edema   SKIN normal to palpation   PSYCH alert, A+O to time, place, person, depressed   Review of Systems:  Subjective/Chief Complaint chest pain   General: No Complaints   Skin: No Complaints   ENT: No Complaints   Eyes: No Complaints   Neck: No Complaints   Respiratory: Short of breath   Cardiovascular: Chest pain or discomfort  Tightness   Gastrointestinal: No Complaints   Genitourinary: No Complaints   Vascular: No Complaints   Musculoskeletal: No Complaints   Neurologic: No Complaints   Hematologic:  No Complaints   Endocrine: No Complaints   Psychiatric: Depression   ROS Pt not able to provide ROS   Home Medications: Medication Instructions Status  aspirin 325 mg oral tablet 1 tab(s) orally once a day Active   Lab Results: Routine Chem:  24-Mar-14 08:31   Glucose, Serum  131  BUN  5  Creatinine (comp) 0.81  Sodium, Serum 137  Potassium, Serum 3.6  Chloride, Serum  108  CO2, Serum 23  Calcium (Total), Serum  8.2  Anion Gap  6  Osmolality (calc) 273  eGFR (African American) >60  eGFR (Non-African American) >60 (eGFR values <8m/min/1.73 m2 may be an indication of chronic kidney disease (CKD). Calculated eGFR is useful in patients with stable renal function. The eGFR calculation will not be reliable in acutely ill patients when serum creatinine is changing rapidly. It is not useful in  patients on dialysis. The eGFR calculation may not be applicable to patients at the low and high extremes of body sizes, pregnant women, and vegetarians.)  Cardiac:  24-Mar-14 08:31   CK, Total 75  CPK-MB, Serum  < 0.5 (Result(s) reported on 08 Mar 2013 at 11:32AM.)  Troponin I < 0.02 (0.00-0.05 0.05 ng/mL or less: NEGATIVE  Repeat testing in 3-6 hrs  if clinically indicated. >0.05 ng/mL: POTENTIAL  MYOCARDIAL INJURY. Repeat  testing in 3-6 hrs if  clinically indicated. NOTE: An increase or decrease  of 30% or more on serial  testing suggests a  clinically important change)  Routine Hem:  24-Mar-14 08:31  WBC (CBC) 8.6  RBC (CBC)  4.27  Hemoglobin (CBC) 15.2  Hematocrit (CBC) 42.2  Platelet Count (CBC) 252 (Result(s) reported on 08 Mar 2013 at 09:01AM.)  MCV 99  MCH  35.6  MCHC 36.0  RDW 13.1   EKG:  EKG NSR   Interpretation no significant ST or T wave changes.   Radiology Results: XRay:    24-Mar-14 08:47, Chest PA and Lateral  Chest PA and Lateral   REASON FOR EXAM:    Chest Pain  COMMENTS:       PROCEDURE: DXR - DXR CHEST PA (OR AP) AND LATERAL  - Mar 08 2013  8:47AM     RESULT: Comparison is made to the study of 13 March 2011.    Cardiac monitoring electrodes are present. The lungs are clear. The heart   and pulmonary vessels are normal. The bony and mediastinal structures are   unremarkable. There is no effusion. There is no pneumothorax or evidence   of congestive failure.    IMPRESSION:  No acute cardiopulmonary disease.    Dictation Site: 2    Verified By: Sundra Aland, M.D., MD    Penicillin: Rash  Vital Signs/Nurse's Notes: **Vital Signs.:   24-Mar-14 11:14  Vital Signs Type Admission  Temperature Temperature (F) 97.8  Celsius 36.5  Temperature Source oral  Pulse Pulse 78  Respirations Respirations 20  Systolic BP Systolic BP 025  Diastolic BP (mmHg) Diastolic BP (mmHg) 75  Mean BP 89  Pulse Ox % Pulse Ox % 97  Oxygen Delivery Room Air/ 21 %    Impression 1. Atypical chest pain with known history of coronary artery disease status post anterior myocardial infarction in 2012 treated with a bare-metal stent placement to the LAD. Recent low risk nuclear stress test in December of 2013. 2. Medical noncompliance. 3. Active cocaine use. 4. Tobacco use.   Plan unfortunately, the patient has not been taking medications as prescribed. He also admits to recent cocaine use. Cardiac enzymes so far are negative and EKG did not show any acute changes. Continue to cycle cardiac enzymes and observe overnight. He had a recent nuclear stress test in December of 2013 at [facility] which overall was low risk. If the patient rules out as expected, he can be discharged home tomorrow from a cardiac standpoint if he has no recurrent chest pain. I had a prolonged discussion with him about the importance of taking his cardiac medications including aspirin, small dose carvedilol, small dose ACE inhibitor and a statin. I also discussed with him the dangers of continued cocaine and tobacco use.   Electronic Signatures: Kathlyn Sacramento (MD)  (Signed 24-Mar-14 13:51)  Authored: General Aspect/Present Illness, History and Physical Exam, Review of System, Home Medications, Labs, EKG , Radiology, Allergies, Vital Signs/Nurse's Notes, Impression/Plan   Last Updated: 24-Mar-14 13:51 by Kathlyn Sacramento (MD)

## 2015-04-07 NOTE — Discharge Summary (Signed)
PATIENT NAME:  Ethan Wells, Ethan Wells MR#:  [id number] DATE OF BIRTH:  [birth year]  DATE OF ADMISSION:  03/08/2013 DATE OF DISCHARGE:  03/10/2013  DISCHARGE DIAGNOSES:  1.  Chest pain, very atypical, likely due to cocaine abuse and medication noncompliance with negative cardiac enzymes. He already had a negative Myoview in December 2013 at [facility]. Cardiology in agreement with discharge plan.  2.  Medication noncompliance. Had extensive discussion with him about the importance of taking his cardiac medication including aspirin, small-dose Coreg and ACE inhibitor along with statin.  3.  Active cocaine and tobacco abuse. Counseled on both and gone over dangers of abusing those.   SECONDARY DIAGNOSES:  1.  Coronary artery disease, status post myocardial infarction. 2.  History of methicillin-resistant Staphylococcus aureus.  3.  Diabetes.   CONSULTATION: Cardiology, Dr. Fletcher Anon.   PROCEDURES AND RADIOLOGY: Abdominal ultrasound on March 25 showed normal gallbladder.   Chest x-ray on March 24 showed no acute cardiopulmonary disease.   HISTORY AND SHORT HOSPITAL COURSE: The patient is a 44 year old male with above-mentioned medical problems who was admitted for atypical chest pain, was ruled out with 3 negative sets of cardiac enzymes. Cardiology consultation was obtained with Dr. Fletcher Anon who agreed with management and recommended discharging home. The patient started having abdominal pain, for which abdominal ultrasound was performed and he was doing much better on March 26 and is being discharged home in stable condition.   PHYSICAL EXAMINATION:  VITAL SIGNS: On the date of discharge, his temperature is 98.2, heart rate 64 per minute, respirations 18 per minute, blood pressure 105/70 mmHg. He was saturating 98% on room air.  CARDIOVASCULAR: S1, S2 normal. No murmurs, rubs or gallop.  LUNGS: Clear to auscultation bilaterally. No wheezing, rales, rhonchi or crepitation.  ABDOMEN: Soft, benign.   NEUROLOGIC: Nonfocal examination. All other physical examination remained at baseline.   DISCHARGE MEDICATIONS:  1.  Aspirin 325 mg p.o. daily. 2.  Atorvastatin 20 mg p.o. at bedtime.  3.  Metoprolol 25 mg p.o. daily.  4.  Altace 1.25 mg p.o. daily.   DISCHARGE DIET: Regular.   DISCHARGE ACTIVITY: As tolerated.   DISCHARGE INSTRUCTIONS AND FOLLOWUP: The patient was instructed to follow up with [facility] in 1 to 2 weeks. She will need followup with [facility] in 2 to 4 weeks.   TOTAL TIME DISCHARGING THIS PATIENT: 45 minutes.  ____________________________ Ethan Wells. Manuella Ghazi, MD vss:jm D: 03/10/2013 21:05:03 ET T: 03/10/2013 21:29:16 ET JOB#: [id number]  cc: Kaslyn Richburg S. Manuella Ghazi, MD, <Dictator> [facility] Ethan Wells [facility] MD ELECTRONICALLY SIGNED 03/11/2013 20:39
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
## 2018-04-19 ENCOUNTER — Emergency Department
Admission: EM | Admit: 2018-04-19 | Discharge: 2018-04-19 | Disposition: A | Discharge status: Home / Self Care | Payer: Medicare HMO | Attending: Emergency Medicine | Admitting: Emergency Medicine

## 2018-04-19 ENCOUNTER — Emergency Department: Payer: Medicare HMO

## 2018-04-19 ENCOUNTER — Other Ambulatory Visit: Payer: Self-pay

## 2018-04-19 ENCOUNTER — Encounter: Payer: Self-pay | Admitting: Emergency Medicine

## 2018-04-19 DIAGNOSIS — Z7982 Long term (current) use of aspirin: Secondary | ICD-10-CM | POA: Diagnosis not present

## 2018-04-19 DIAGNOSIS — I255 Ischemic cardiomyopathy: Secondary | ICD-10-CM | POA: Insufficient documentation

## 2018-04-19 DIAGNOSIS — R1110 Vomiting, unspecified: Secondary | ICD-10-CM | POA: Diagnosis not present

## 2018-04-19 DIAGNOSIS — I10 Essential (primary) hypertension: Secondary | ICD-10-CM | POA: Diagnosis not present

## 2018-04-19 DIAGNOSIS — Z79899 Other long term (current) drug therapy: Secondary | ICD-10-CM | POA: Insufficient documentation

## 2018-04-19 DIAGNOSIS — F17200 Nicotine dependence, unspecified, uncomplicated: Secondary | ICD-10-CM | POA: Insufficient documentation

## 2018-04-19 DIAGNOSIS — K7689 Other specified diseases of liver: Secondary | ICD-10-CM | POA: Diagnosis not present

## 2018-04-19 DIAGNOSIS — I2510 Atherosclerotic heart disease of native coronary artery without angina pectoris: Secondary | ICD-10-CM | POA: Diagnosis not present

## 2018-04-19 DIAGNOSIS — K2920 Alcoholic gastritis without bleeding: Secondary | ICD-10-CM | POA: Insufficient documentation

## 2018-04-19 DIAGNOSIS — R112 Nausea with vomiting, unspecified: Secondary | ICD-10-CM

## 2018-04-19 DIAGNOSIS — R1010 Upper abdominal pain, unspecified: Secondary | ICD-10-CM | POA: Diagnosis not present

## 2018-04-19 LAB — COMPREHENSIVE METABOLIC PANEL
ALT: 58 U/L (ref 17–63)
AST: 83 U/L — ABNORMAL HIGH (ref 15–41)
Albumin: 3.7 g/dL (ref 3.5–5.0)
Alkaline Phosphatase: 97 U/L (ref 38–126)
Anion gap: 12 (ref 5–15)
BUN: <5 mg/dL — ABNORMAL LOW (ref 6–20)
CO2: 22 mmol/L (ref 22–32)
Calcium: 8.9 mg/dL (ref 8.9–10.3)
Chloride: 97 mmol/L — ABNORMAL LOW (ref 101–111)
Creatinine, Ser: 0.48 mg/dL — ABNORMAL LOW (ref 0.61–1.24)
GFR calc Af Amer: >60 mL/min (ref >60)
GFR calc non Af Amer: >60 mL/min (ref >60)
Glucose, Bld: 109 mg/dL — ABNORMAL HIGH (ref 65–99)
Potassium: 3.4 mmol/L — ABNORMAL LOW (ref 3.5–5.1)
Sodium: 131 mmol/L — ABNORMAL LOW (ref 135–145)
Total Bilirubin: 1 mg/dL (ref 0.3–1.2)
Total Protein: 8 g/dL (ref 6.5–8.1)

## 2018-04-19 LAB — CBC
HCT: 49.8 % (ref 40.0–52.0)
Hemoglobin: 17.9 g/dL (ref 13.0–18.0)
MCH: 39.4 pg — ABNORMAL HIGH (ref 26.0–34.0)
MCHC: 35.9 g/dL (ref 32.0–36.0)
MCV: 109.6 fL — ABNORMAL HIGH (ref 80.0–100.0)
Platelets: 236 10*3/uL (ref 150–440)
RBC: 4.54 MIL/uL (ref 4.40–5.90)
RDW: 15 % — ABNORMAL HIGH (ref 11.5–14.5)
WBC: 10.1 10*3/uL (ref 3.8–10.6)

## 2018-04-19 LAB — URINALYSIS, COMPLETE (UACMP) WITH MICROSCOPIC
Bacteria, UA: NONE SEEN
Bilirubin Urine: NEGATIVE
Glucose, UA: NEGATIVE mg/dL
Hgb urine dipstick: NEGATIVE
Ketones, ur: NEGATIVE mg/dL
Leukocytes, UA: NEGATIVE
Nitrite: NEGATIVE
Protein, ur: NEGATIVE mg/dL
Specific Gravity, Urine: 1.005 (ref 1.005–1.030)
pH: 6 (ref 5.0–8.0)

## 2018-04-19 LAB — LIPASE, BLOOD: Lipase: 30 U/L (ref 11–51)

## 2018-04-19 LAB — TROPONIN I: Troponin I: <0.03 ng/mL (ref <0.03)

## 2018-04-19 IMAGING — US US ABDOMEN LIMITED
1 series · 14 of 25 positions shown · non-contrast
Comparison: Ultrasound [DATE].

CLINICAL DATA: Acute right upper quadrant abdominal pain.

EXAM:
ULTRASOUND ABDOMEN LIMITED RIGHT UPPER QUADRANT

[Series 1: us abdomen limited · 14 of 50 slices shown]
[im 1/50]
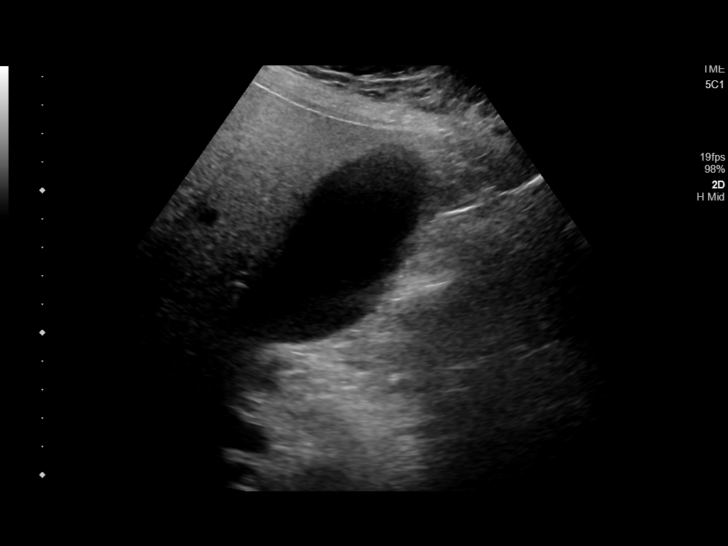
[im 5/50]
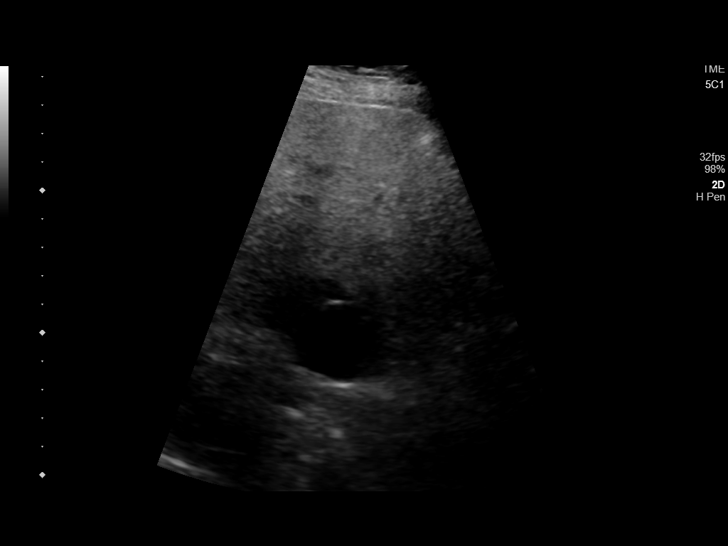
[im 9/50]
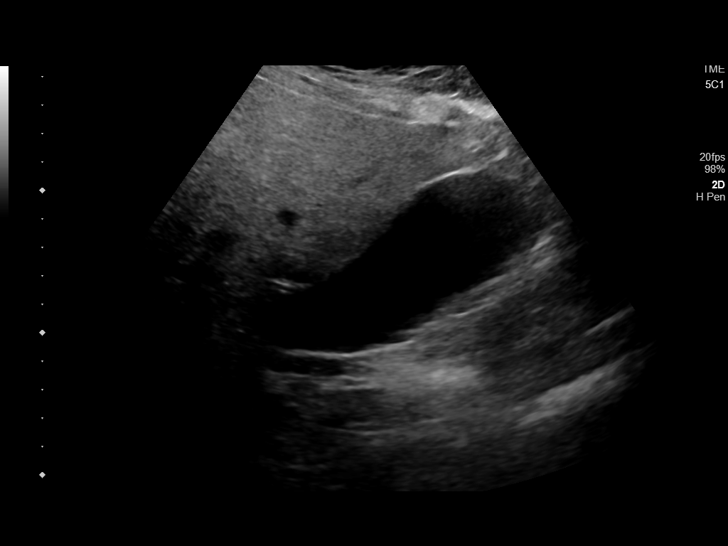
[im 13/50]
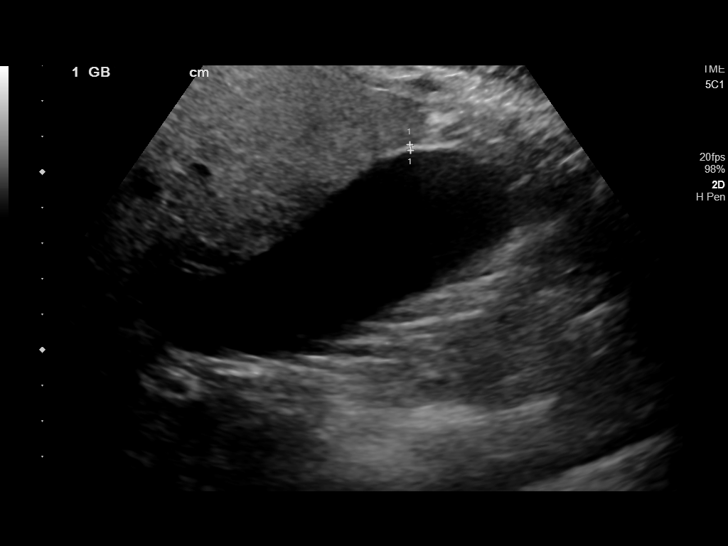
[im 17/50]
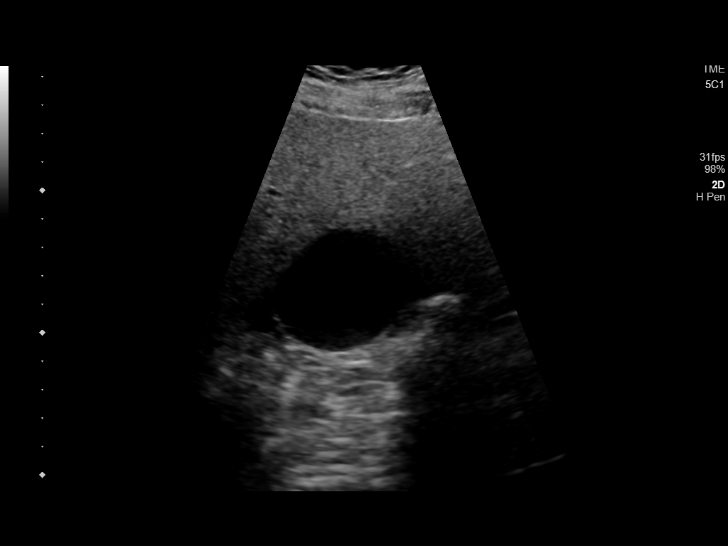
[im 19/50]
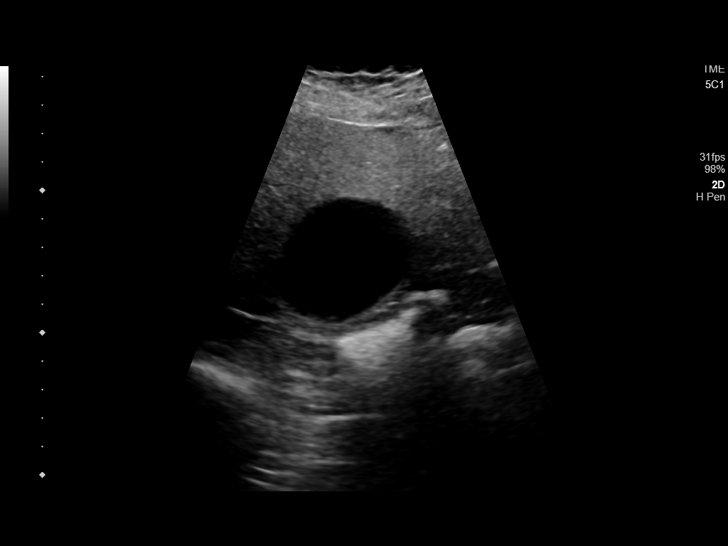
[im 23/50]
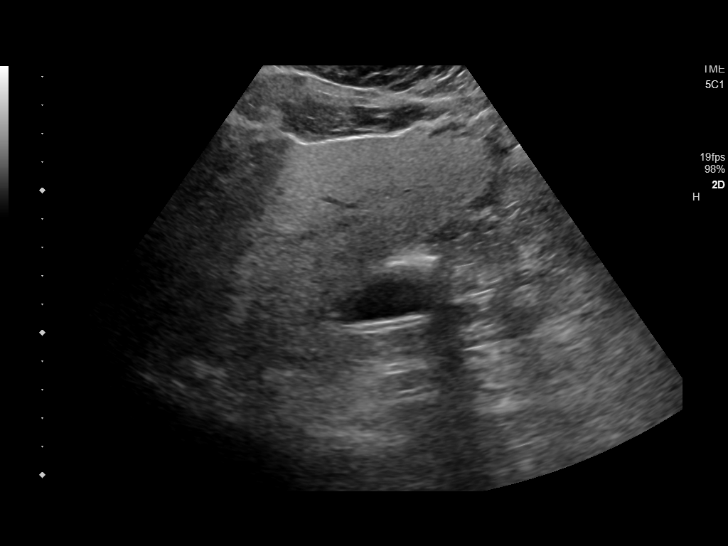
[im 27/50]
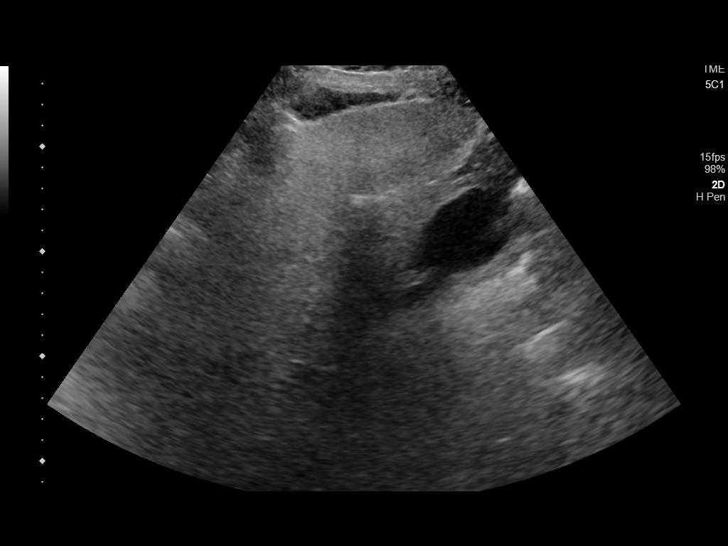
[im 31/50]
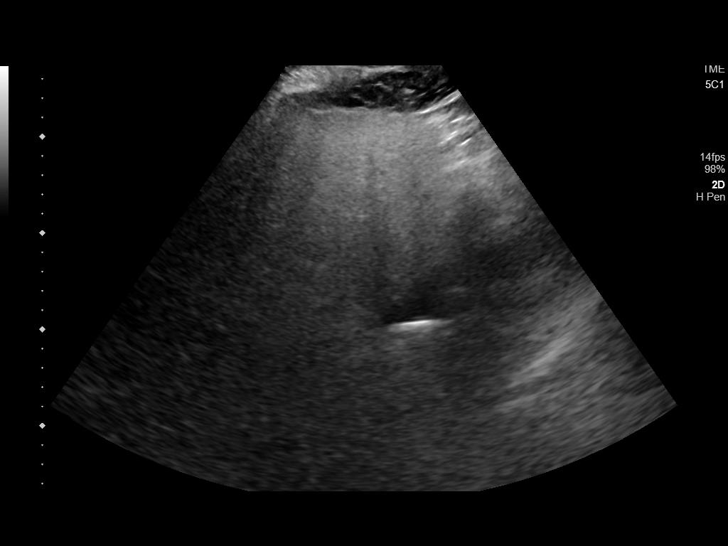
[im 33/50]
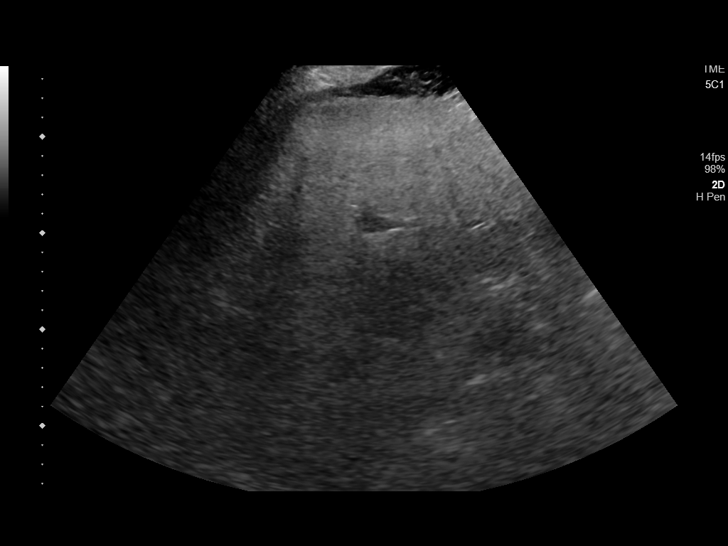
[im 37/50]
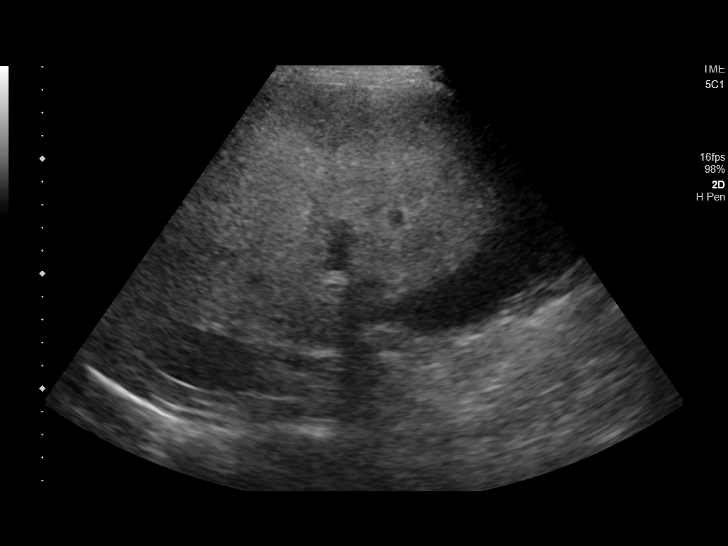
[im 41/50]
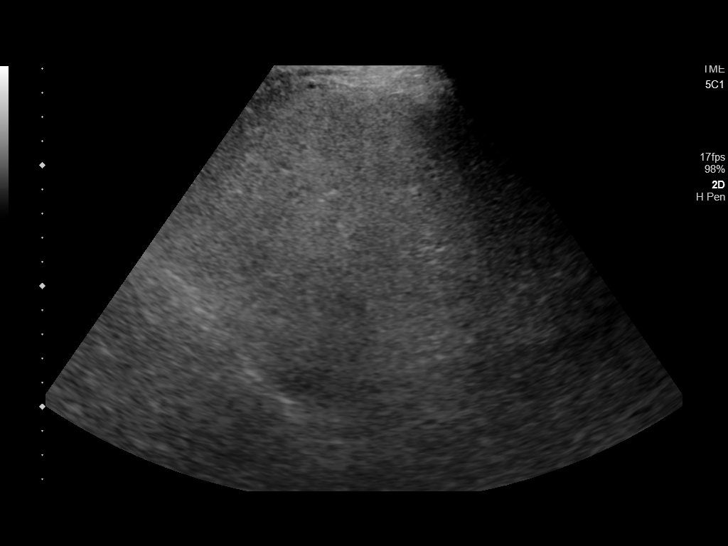
[im 45/50]
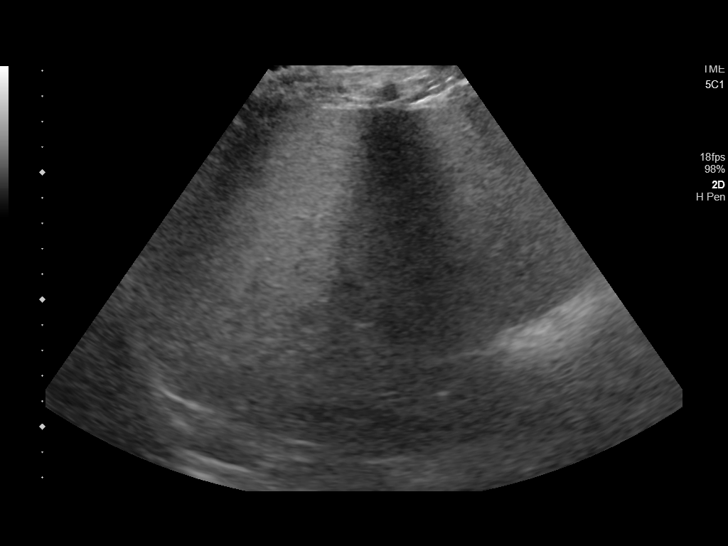
[im 50/50]
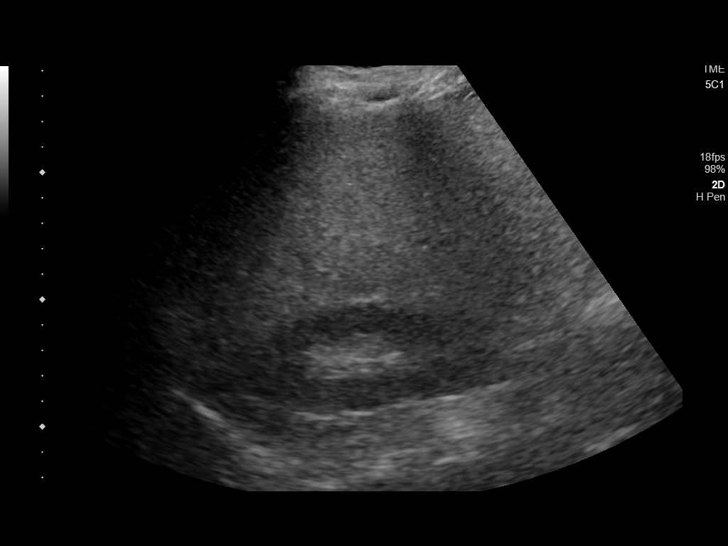

[14 of 25 positions shown; findings below may reference images not displayed]

FINDINGS: Gallbladder:

No gallstones or wall thickening visualized. No sonographic Murphy
sign noted by sonographer.

Common bile duct:

Diameter: 3 mm which is within normal limits.

Liver:

No focal lesion identified. Increased echogenicity of hepatic
parenchyma is noted. Portal vein is patent on color Doppler imaging
with normal direction of blood flow towards the liver.
IMPRESSION: Increased echogenicity of hepatic parenchyma is noted suggesting
fatty infiltration or other diffuse hepatocellular disease. No other
abnormality seen in the right upper quadrant of the abdomen.

## 2018-04-19 IMAGING — CT CT ABD-PELV W/ CM
2 of 5 series · 16 of 46 positions shown, 18 images · IV contrast (APPLIED)
Comparison: Abdominal ultrasound dated [DATE]

CLINICAL DATA: 46-year-old male with nausea vomiting and abdominal
pain.

EXAM:
CT ABDOMEN AND PELVIS WITH CONTRAST
TECHNIQUE: Multidetector CT imaging of the abdomen and pelvis was performed
using the standard protocol following bolus administration of
intravenous contrast.
CONTRAST:  75mL [WP] IOPAMIDOL ([WP]) INJECTION 76%

[Series 2: routine abd/pel with · axial · 0.69mm/px · z∈[-477,-87]mm · 13 of 88 slices shown, 15 images]
[im 5/88  soft-tissue]
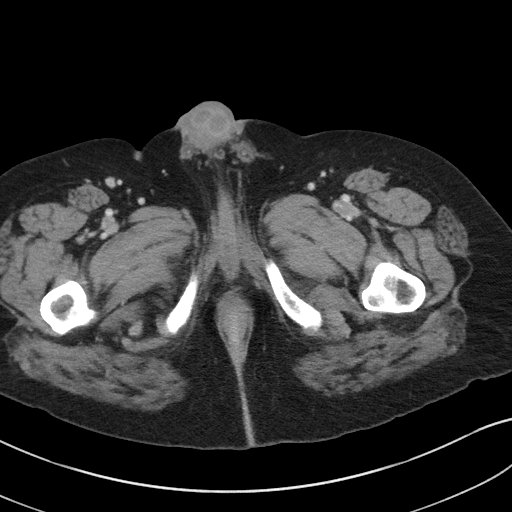
[im 5/88  bone]
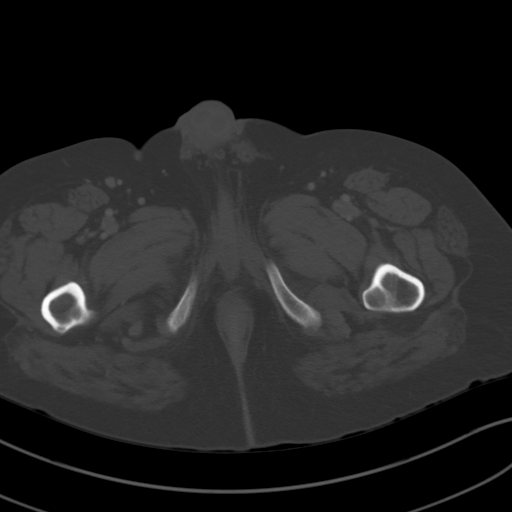
[im 14/88  soft-tissue]
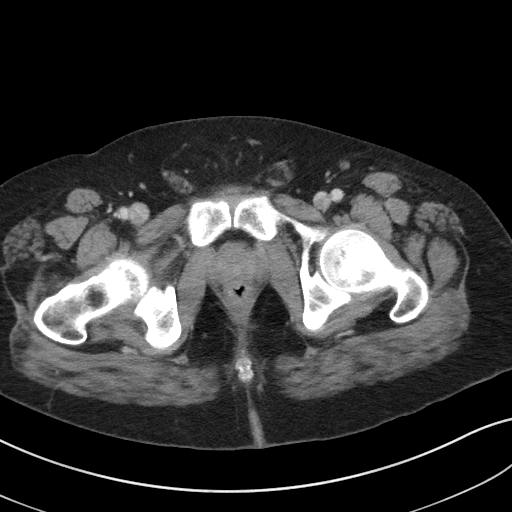
[im 19/88  soft-tissue]
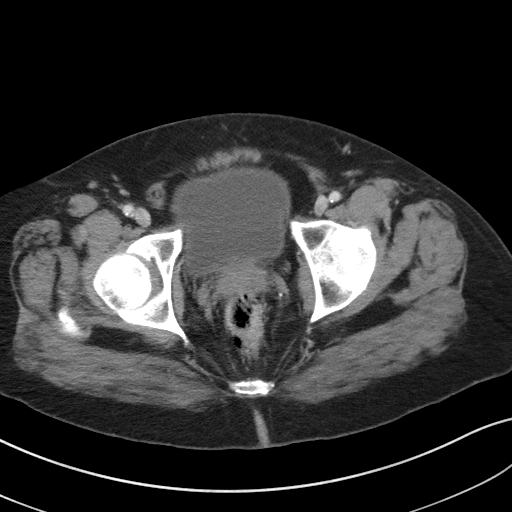
[im 23/88  soft-tissue]
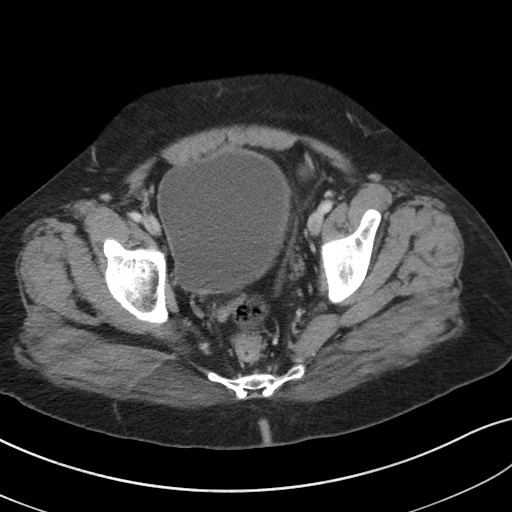
[im 33/88  soft-tissue]
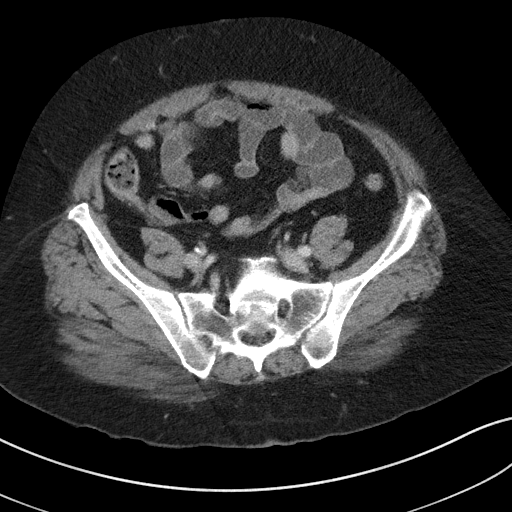
[im 37/88  soft-tissue]
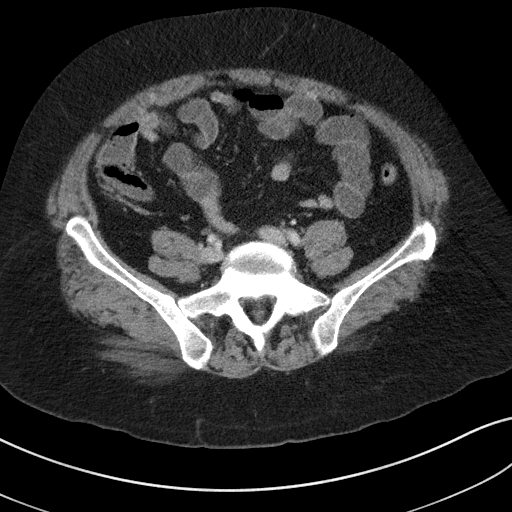
[im 46/88  soft-tissue]
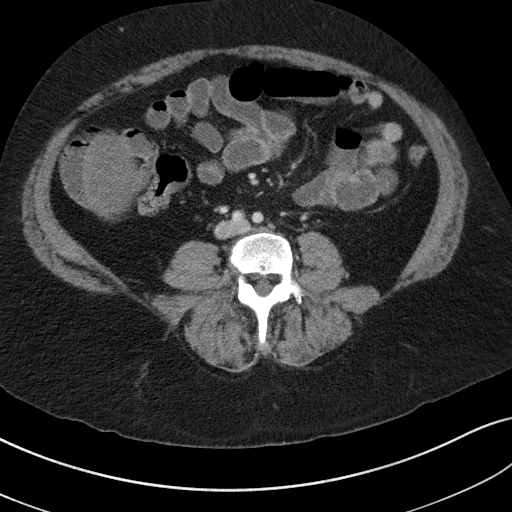
[im 51/88  soft-tissue]
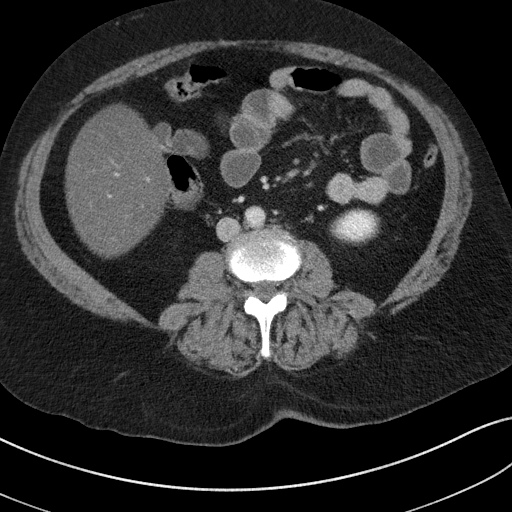
[im 55/88  soft-tissue]
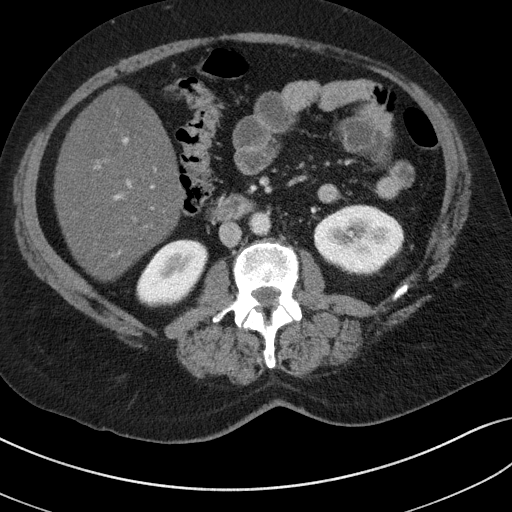
[im 55/88  bone]
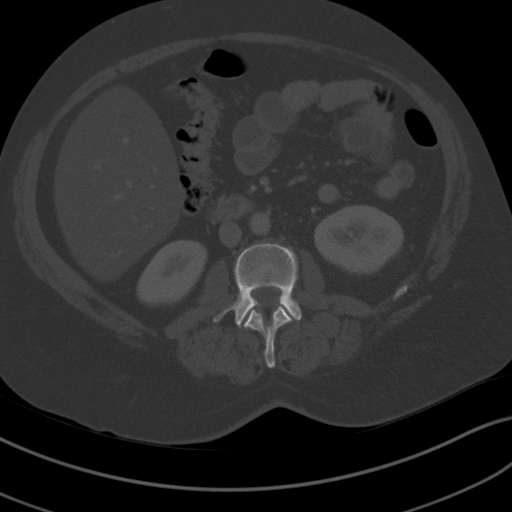
[im 65/88  soft-tissue]
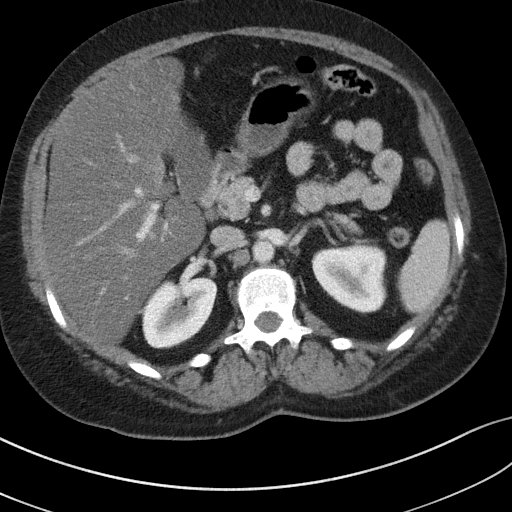
[im 69/88  soft-tissue]
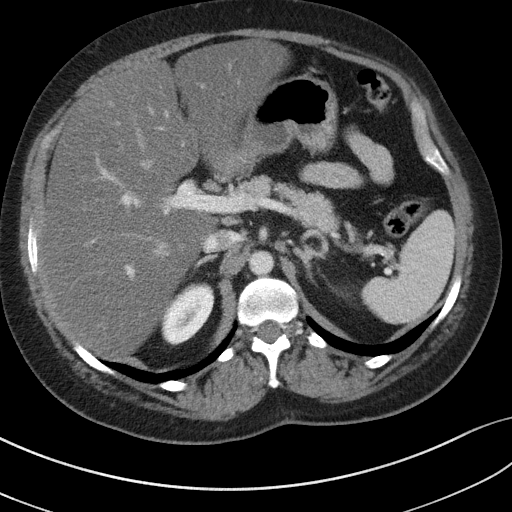
[im 74/88  soft-tissue]
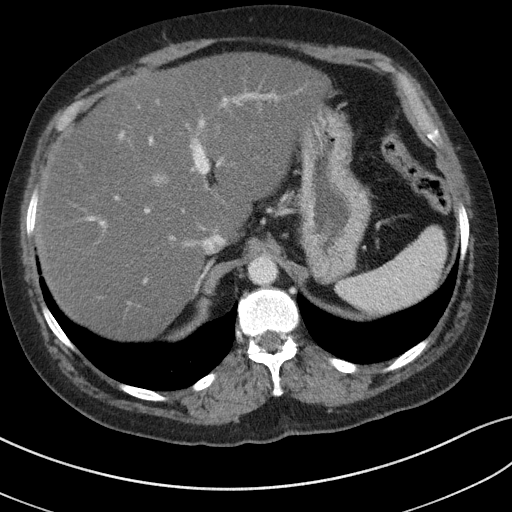
[im 83/88  soft-tissue]
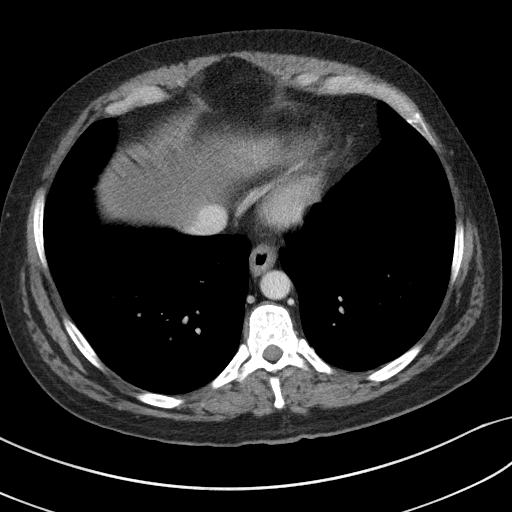

[Series 5: coronal st · coronal · 0.76mm/px · 3 of 90 slices shown]
[im 30/90  soft-tissue]
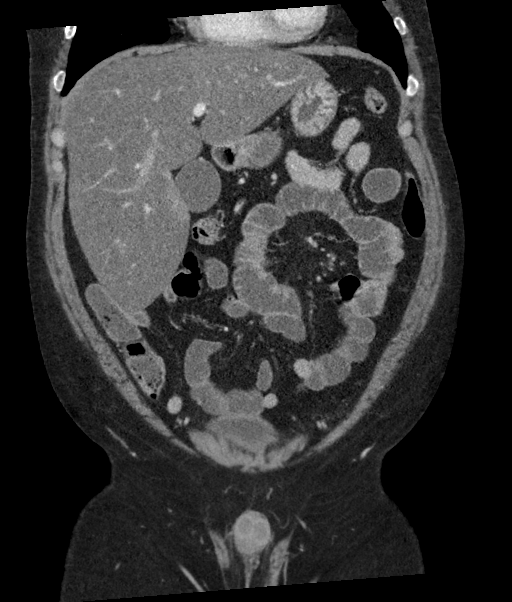
[im 40/90  soft-tissue]
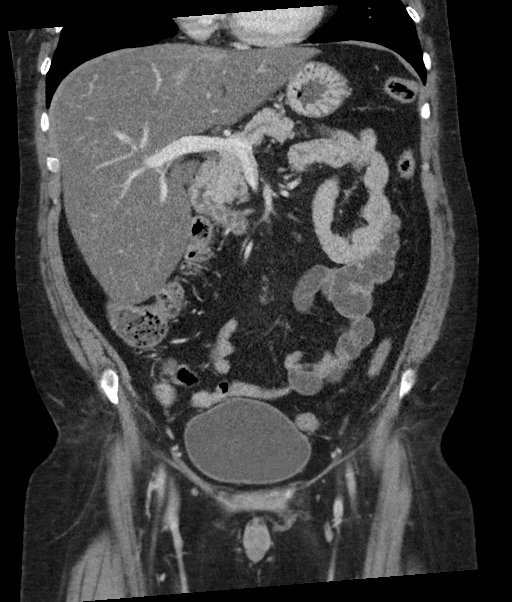
[im 50/90  soft-tissue]
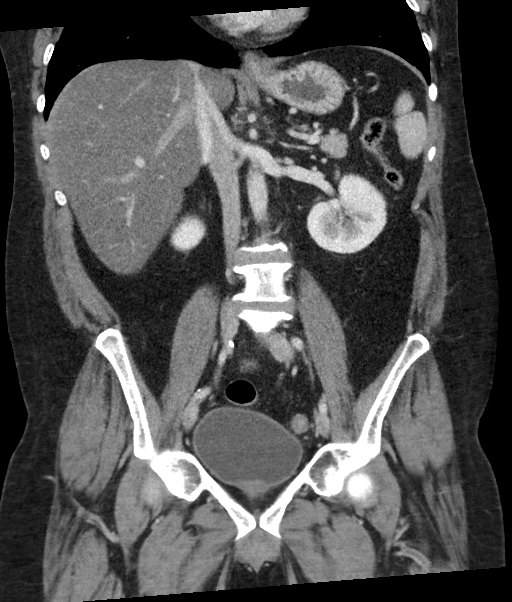

[16 of 46 positions shown; findings below may reference images not displayed]

FINDINGS: Lower chest: The visualized lung bases are clear.

No intra-abdominal free air or free fluid.

Hepatobiliary: There is diffuse fatty infiltration of the liver. No
intrahepatic biliary ductal dilatation. The gallbladder is
physiologically distended. No calcified gallstone.

Pancreas: Unremarkable. No pancreatic ductal dilatation or
surrounding inflammatory changes.

Spleen: Normal in size without focal abnormality.

Adrenals/Urinary Tract: Adrenal glands are unremarkable. Kidneys are
normal, without renal calculi, focal lesion, or hydronephrosis.
Bladder is unremarkable.

Stomach/Bowel: Stomach is within normal limits. Appendix appears
normal. No evidence of bowel wall thickening, distention, or
inflammatory changes.

Vascular/Lymphatic: Mild aortoiliac atherosclerotic disease. No
portal venous gas. There is no adenopathy.

Reproductive: The prostate and seminal vesicles are grossly
unremarkable. No pelvic mass.

Other: None

Musculoskeletal: No acute or significant osseous findings.
IMPRESSION: 1. No acute intra-abdominopelvic pathology.
2. Fatty liver.

## 2018-04-19 MED ORDER — OXYCODONE-ACETAMINOPHEN 5-325 MG PO TABS
2.0000 | ORAL_TABLET | Freq: Once | ORAL | Status: AC
  Administered 2018-04-19: 2 via ORAL
  Filled 2018-04-19: qty 2

## 2018-04-19 MED ORDER — SODIUM CHLORIDE 0.9 % IV BOLUS
1000.0000 mL | INTRAVENOUS | Status: AC
Start: 2018-04-19 — End: 2018-04-19
  Administered 2018-04-19: 1000 mL via INTRAVENOUS

## 2018-04-19 MED ORDER — ONDANSETRON HCL 4 MG/2ML IJ SOLN
4.0000 mg | INTRAMUSCULAR | Status: AC
  Administered 2018-04-19: 4 mg via INTRAVENOUS
  Filled 2018-04-19: qty 2

## 2018-04-19 MED ORDER — ONDANSETRON 4 MG PO TBDP: ORAL_TABLET | ORAL | 0 refills | Status: DC

## 2018-04-19 MED ORDER — HYDROCODONE-ACETAMINOPHEN 5-325 MG PO TABS: 1.0000 | ORAL_TABLET | ORAL | 0 refills | Status: DC | PRN

## 2018-04-19 MED ORDER — OMEPRAZOLE 20 MG PO CPDR: 20.0000 mg | DELAYED_RELEASE_CAPSULE | Freq: Two times a day (BID) | ORAL | 1 refills | Status: DC

## 2018-04-19 MED ORDER — SUCRALFATE 1 G PO TABS: 1.0000 g | ORAL_TABLET | Freq: Four times a day (QID) | ORAL | 1 refills | Status: DC | PRN

## 2018-04-19 MED ORDER — MORPHINE SULFATE (PF) 4 MG/ML IV SOLN
4.0000 mg | Freq: Once | INTRAVENOUS | Status: AC
  Administered 2018-04-19: 4 mg via INTRAVENOUS
  Filled 2018-04-19: qty 1

## 2018-04-19 MED ORDER — GI COCKTAIL ~~LOC~~
30.0000 mL | Freq: Once | ORAL | Status: AC
Start: 2018-04-19 — End: 2018-04-19
  Administered 2018-04-19: 30 mL via ORAL

## 2018-04-19 MED ORDER — IOPAMIDOL (ISOVUE-370) INJECTION 76%
75.0000 mL | Freq: Once | INTRAVENOUS | Status: AC | PRN
  Administered 2018-04-19: 75 mL via INTRAVENOUS
  Filled 2018-04-19: qty 75

## 2018-04-19 NOTE — ED Triage Notes (Signed)
Pt here for epigastric pain since this morning. Describes as constant.  Did use cocaine last night along with alcohol.  Denies NVD.   Ambulatory without difficulty.  Color WNl.

## 2018-04-19 NOTE — Discharge Instructions (Signed)
As we discussed, your medical work-up was reassuring today.  You had lab work, and EKG, and ultrasound of your liver and gallbladder, and a CT scan of your abdomen and pelvis.  All of it was within normal limits and did not show any acute or emergent medical condition.  We think that most likely you have in a condition called gastritis which can be caused by alcohol or even by something that you ate or drink that did not agree with you.  We encourage you to take the medications prescribed which should help with your condition, but you also need to follow-up with a GI doctor such as Dr. Vicente Males or 1 of his colleagues at the number provided.  Given how long you have been drinking alcohol, we do not recommend you stop completely, but you may want to continue trying to cut back on your alcohol consumption.  Avoid any other drugs and try to stick to a bland diet (chicken soup, Jell-O, crackers, etc.) and plenty of clear fluids such as water or Gatorade until you are feeling much better.    Return to the emergency department if you develop new or worsening symptoms that concern you.

## 2018-04-19 NOTE — ED Provider Notes (Signed)
[facility] Emergency Department Provider Note  ____________________________________________   First MD Initiated Contact with Patient 04/19/18 1935     (approximate)  I have reviewed the triage vital signs and the nursing notes.   HISTORY  Chief Complaint Abdominal Pain    HPI Ethan Wells is a 47 y.o. male with medical history as listed below who presents for evaluation of acute onset upper abdominal pain with nausea and dry heaves starting this morning at about 6 AM.  He states that he does drink heavily daily but last night he also used cocaine for the first time in many years.  When he woke up this morning he was feeling ill and the symptoms have continued.  He said that trying to eat anything makes his symptoms worse.  Nothing particular makes it better.  All the pain is in his upper abdomen in the middle as well as somewhat on the right.  He is never had any abdominal surgeries.  He is no lower abdominal pain.  He denies fever/chills, chest pain, shortness of breath.  He has had some loose stools chronically for more than a month.  He occasionally sees some blood in his stools but that is been on and off for several months and he reports that he also has hemorrhoids.  He smokes tobacco, drinks alcohol regularly, but reports that he has not used cocaine in years until last night.  Past Medical History:  Diagnosis Date  . CAD (coronary artery disease)    a. 01/2011 Anterior STEMI/Cath/PCI: LM nl, LAD 100d (3.5x73mm Vision BMS placed), LCX 46m, RI 50, RCA min irregs, EF 40% w/ apical, inferoapical HK.  . Cardiac arrest - ventricular fibrillation    a. In setting of STEMI 01/2011  . Cocaine abuse ([place])   . ETOH abuse    a. 6-12 beers/day  . Hemorrhoids   . Hypertension   . Ischemic cardiomyopathy    a. 06/2011 Echo: EF 45-50%, No rwma  . Marijuana abuse   . Tobacco abuse    a. 1/2 ppd x 26 yrs    Patient Active Problem List   Diagnosis Date  Noted  . Chest pain, mid sternal 11/17/2012  . Cocaine abuse ([place])   . Marijuana abuse   . TOBACCO ABUSE 02/26/2011  . HYPERTENSION, BENIGN 02/26/2011  . CAD, NATIVE VESSEL 02/26/2011  . Other specified forms of chronic ischemic heart disease 02/26/2011    History reviewed. No pertinent surgical history.  Prior to Admission medications   Medication Sig Start Date End Date Taking? Authorizing Provider  aspirin 81 MG tablet Take 1 tablet (81 mg total) by mouth daily. 12/17/12   Burnell Blanks, MD  busPIRone (BUSPAR) 10 MG tablet Take 1 tablet (10 mg total) by mouth 3 (three) times daily. 11/17/12   Hope, Jessica A, PA-C  carvedilol (COREG) 6.25 MG tablet Take 1 tablet (6.25 mg total) by mouth 2 (two) times daily. 12/17/12   Burnell Blanks, MD  esomeprazole (NEXIUM) 40 MG capsule Take 1 capsule (40 mg total) by mouth daily before breakfast. 10/11/13   Burnell Blanks, MD  FLUoxetine (PROZAC) 40 MG capsule Take 40 mg by mouth daily.    [provider]  GABAPENTIN, PHN, PO Take by mouth as needed.    [provider]  HYDROcodone-acetaminophen (NORCO/VICODIN) 5-325 MG tablet Take 1-2 tablets by mouth every 4 (four) hours as needed for moderate pain. 04/19/18   Hinda Kehr, MD  nitroGLYCERIN (NITROSTAT) 0.4  MG SL tablet Place 0.4 mg under the tongue every 5 (five) minutes as needed. For chest pain. May repeat for up to 3 doses.    [provider]  omeprazole (PRILOSEC) 20 MG capsule Take 1 capsule (20 mg total) by mouth 2 (two) times daily. 04/19/18 04/19/19  Hinda Kehr, MD  ondansetron (ZOFRAN ODT) 4 MG disintegrating tablet Allow 1-2 tablets to dissolve in your mouth every 8 hours as needed for nausea/vomiting 04/19/18   Hinda Kehr, MD  simvastatin (ZOCOR) 40 MG tablet Take 1 tablet (40 mg total) by mouth at bedtime. 06/14/13   Burnell Blanks, MD  sucralfate (CARAFATE) 1 g tablet Take 1 tablet (1 g total) by mouth 4 (four) times daily as  needed (for abdominal discomfort, nausea, and/or vomiting). 04/19/18   Hinda Kehr, MD    Allergies Penicillins  Family History  Problem Relation Age of Onset  . Coronary artery disease Mother        alive  . Other Unknown        2 sisters alive and well  . Alcohol abuse Father        died @ 63    Social History Social History   Tobacco Use  . Smoking status: Smoker, Current Status Unknown    Packs/day: 0.50    Years: 26.00    Pack years: 13.00  Substance Use Topics  . Alcohol use: Yes    Comment: at least a 6 pack of beer daily and often a 12 pack  . Drug use: Yes    Types: Cocaine, Marijuana    Comment: used cocaine about 10 days ago.    Review of Systems Constitutional: No fever/chills Eyes: No visual changes. ENT: No sore throat. Cardiovascular: Denies chest pain. Respiratory: Denies shortness of breath. Gastrointestinal: GI symptoms as described in HPI above. Occasional blood in stool. Genitourinary: Negative for dysuria. Musculoskeletal: Negative for neck pain.  Negative for back pain. Integumentary: Negative for rash. Neurological: Negative for headaches, focal weakness or numbness.   ____________________________________________   PHYSICAL EXAM:  VITAL SIGNS: ED Triage Vitals  Enc Vitals Group     BP 04/19/18 1735 (!) 166/108     Pulse Rate 04/19/18 1732 100     Resp 04/19/18 1732 20     Temp 04/19/18 1732 98.2 F (36.8 C)     Temp Source 04/19/18 1732 Oral     SpO2 04/19/18 1732 99 %     Weight 04/19/18 1733 72.6 kg (160 lb)     Height 04/19/18 1733 1.651 m (5\' 5" )     Head Circumference --      Peak Flow --      Pain Score 04/19/18 1733 10     Pain Loc --      Pain Edu? --      Excl. in [place]? --     Constitutional: Alert and oriented.  Disheveled and appears uncomfortable Eyes: Conjunctivae are normal.  Head: Atraumatic. Nose: No congestion/rhinnorhea. Mouth/Throat: Mucous membranes are moist. Neck: No stridor.  No meningeal signs.     Cardiovascular: Normal rate, regular rhythm. Good peripheral circulation. Grossly normal heart sounds. Respiratory: Normal respiratory effort.  No retractions. Lungs CTAB. Gastrointestinal: Soft with severe tenderness to palpation of the epigastrium and right upper quadrant.  Murphy sign was positive and elicited tears from the patient tenderness appeared severe.  Mild distention but this may be just his body habitus.  No lower abdominal tenderness to palpation.,  No rebound no guarding Musculoskeletal:  No lower extremity tenderness nor edema. No gross deformities of extremities. Neurologic:  Normal speech and language. No gross focal neurologic deficits are appreciated.  Skin:  Skin is warm, dry and intact. No rash noted. Psychiatric: Mood and affect are normal. Speech and behavior are normal.  ____________________________________________   LABS (all labs ordered are listed, but only abnormal results are displayed)  Labs Reviewed  COMPREHENSIVE METABOLIC PANEL - Abnormal; Notable for the following components:      Result Value   Sodium 131 (*)    Potassium 3.4 (*)    Chloride 97 (*)    Glucose, Bld 109 (*)    BUN <5 (*)    Creatinine, Ser 0.48 (*)    AST 83 (*)    All other components within normal limits  CBC - Abnormal; Notable for the following components:   MCV 109.6 (*)    MCH 39.4 (*)    RDW 15.0 (*)    All other components within normal limits  URINALYSIS, COMPLETE (UACMP) WITH MICROSCOPIC - Abnormal; Notable for the following components:   Color, Urine YELLOW (*)    APPearance CLEAR (*)    All other components within normal limits  LIPASE, BLOOD  TROPONIN I   ____________________________________________  EKG  ED ECG REPORT I, Hinda Kehr, the attending physician, personally viewed and interpreted this ECG.  Date: 04/19/2018 EKG Time: 17:37 Rate: 96 Rhythm: normal sinus rhythm QRS Axis: normal Intervals: normal ST/T Wave abnormalities: Non-specific ST  segment / T-wave changes, but no evidence of acute ischemia. Narrative Interpretation: no evidence of acute ischemia   ____________________________________________  RADIOLOGY   ED MD interpretation: No indication of acute abnormality on ultrasound although there is indication of long-term alcohol use leading to his good CT scan of the abdomen and pelvis also showed no acute abnormalities, no free air, etc.  Official radiology report(s): Ct Abdomen Pelvis W Contrast  Result Date: 04/19/2018 CLINICAL DATA:  47 year old male with nausea vomiting and abdominal pain. EXAM: CT ABDOMEN AND PELVIS WITH CONTRAST TECHNIQUE: Multidetector CT imaging of the abdomen and pelvis was performed using the standard protocol following bolus administration of intravenous contrast. CONTRAST:  39mL ISOVUE-370 IOPAMIDOL (ISOVUE-370) INJECTION 76% COMPARISON:  Abdominal ultrasound dated 04/19/2018 FINDINGS: Lower chest: The visualized lung bases are clear. No intra-abdominal free air or free fluid. Hepatobiliary: There is diffuse fatty infiltration of the liver. No intrahepatic biliary ductal dilatation. The gallbladder is physiologically distended. No calcified gallstone. Pancreas: Unremarkable. No pancreatic ductal dilatation or surrounding inflammatory changes. Spleen: Normal in size without focal abnormality. Adrenals/Urinary Tract: Adrenal glands are unremarkable. Kidneys are normal, without renal calculi, focal lesion, or hydronephrosis. Bladder is unremarkable. Stomach/Bowel: Stomach is within normal limits. Appendix appears normal. No evidence of bowel wall thickening, distention, or inflammatory changes. Vascular/Lymphatic: Mild aortoiliac atherosclerotic disease. No portal venous gas. There is no adenopathy. Reproductive: The prostate and seminal vesicles are grossly unremarkable. No pelvic mass. Other: None Musculoskeletal: No acute or significant osseous findings. IMPRESSION: 1. No acute intra-abdominopelvic  pathology. 2. Fatty liver. Electronically Signed   By: Anner Crete M.D.   On: 04/19/2018 22:07   US Abdomen Limited Ruq  Result Date: 04/19/2018 CLINICAL DATA:  Acute right upper quadrant abdominal pain. EXAM: ULTRASOUND ABDOMEN LIMITED RIGHT UPPER QUADRANT COMPARISON:  Ultrasound of March 09, 2013. FINDINGS: Gallbladder: No gallstones or wall thickening visualized. No sonographic Murphy sign noted by sonographer. Common bile duct: Diameter: 3 mm which is within normal limits. Liver: No focal lesion identified. Increased echogenicity  of hepatic parenchyma is noted. Portal vein is patent on color Doppler imaging with normal direction of blood flow towards the liver. IMPRESSION: Increased echogenicity of hepatic parenchyma is noted suggesting fatty infiltration or other diffuse hepatocellular disease. No other abnormality seen in the right upper quadrant of the abdomen. Electronically Signed   By: Marijo Conception, M.D.   On: 04/19/2018 21:35    ____________________________________________   PROCEDURES  Critical Care performed: No   Procedure(s) performed:   Procedures   ____________________________________________   INITIAL IMPRESSION / ASSESSMENT AND PLAN / ED COURSE  As part of my medical decision making, I reviewed the following data within the [place] notes reviewed and incorporated, Labs reviewed , EKG interpreted  and Old chart reviewed    Differential diagnosis includes, but is not limited to, biliary disease (biliary colic, acute cholecystitis, cholangitis, choledocholithiasis, etc), intrathoracic causes for epigastric abdominal pain including ACS, gastritis, duodenitis, pancreatitis, small bowel or large bowel obstruction, abdominal aortic aneurysm, hernia, and ulcers.  Top items on my differential for this patient are alcohol induced gastritis, biliary colic, and gastric ulcers.  He is obviously in a significant amount of pain and was extremely  tender to palpation.  Given the possibility of biliary colic I will obtain an ultrasound of the right upper quadrant.  He is getting 1 L of normal saline by IV as well as morphine 4 mg IV and Zofran 4 mg IV.  I will reassess after getting back the ultrasound results.  He understands and agrees with the plan.   Clinical Course as of Apr 19 2238  Nancy Fetter Apr 19, 2018  2145 No acute abnormality identified on U/S.  Proceeding with CT scan  US ABDOMEN LIMITED RUQ [CF]  2220 No evidence of acute abnormality on the CT scan of the abdomen pelvis which is reassuring.  I will reassess but anticipate treating him for acute alcoholic gastritis follow-up with gastroenterology.  CT ABDOMEN PELVIS W CONTRAST [CF]  2236 Patient states he feels better although his pain is not gone.  I given the results of his work-up including the ultrasound and CT scan.  He seems fairly unhappy because he says that this is not gastritis, it is not due to the alcohol, and he knows something is going on.  I reassured him multiple times that I do think something is going on and he feels bad because his stomach is very upset.  I explained that I am prescribing multiple medications which should help and that he needs to stick to a bland diet, and while he should not completely stop drinking, he should try cutting back.  He says that he understands.  I am giving him two Percocet by mouth here as well as a GI cocktail.I reviewed the patient's prescription history over the last 24 months in the multi-state controlled substances database(s) that includes [place], [place], [place], [place], [place], [place], [place], [place], [place], [place], [place], [place], [place], [place], and [place].  The patient has filled no controlled substances during that time.  Given that he is clearly in pain, I am giving him a prescription for 15 Norco which I think should help get through the worst of his current symptoms, in  addition to a prescription for Zofran, Carafate, and omeprazole.  I gave strict return precautions and he says that he understands and agrees with the plan, although he still is clearly not convinced.  I did try multiple times explaining the  medical work-up and the results.  He promises he will come back if he gets worse.    [CF]    Clinical Course User Index [CF] Hinda Kehr, MD    ____________________________________________  FINAL CLINICAL IMPRESSION(S) / ED DIAGNOSES  Final diagnoses:  Upper abdominal pain  Acute alcoholic gastritis, presence of bleeding unspecified  Nausea and vomiting, intractability of vomiting not specified, unspecified vomiting type     MEDICATIONS GIVEN DURING THIS VISIT:  Medications  oxyCODONE-acetaminophen (PERCOCET/ROXICET) 5-325 MG per tablet 2 tablet (has no administration in time range)  gi cocktail (Maalox,Lidocaine,Donnatal) (has no administration in time range)  morphine 4 MG/ML injection 4 mg (4 mg Intravenous Given 04/19/18 2018)  ondansetron (ZOFRAN) injection 4 mg (4 mg Intravenous Given 04/19/18 2018)  sodium chloride 0.9 % bolus 1,000 mL (1,000 mLs Intravenous New Bag/Given 04/19/18 2018)  iopamidol (ISOVUE-370) 76 % injection 75 mL (75 mLs Intravenous Contrast Given 04/19/18 2130)     ED Discharge Orders        Ordered    HYDROcodone-acetaminophen (NORCO/VICODIN) 5-325 MG tablet  Every 4 hours PRN     04/19/18 2236    ondansetron (ZOFRAN ODT) 4 MG disintegrating tablet     04/19/18 2236    sucralfate (CARAFATE) 1 g tablet  4 times daily PRN     04/19/18 2236    omeprazole (PRILOSEC) 20 MG capsule  2 times daily     04/19/18 2236       Note:  This document was prepared using Dragon voice recognition software and may include unintentional dictation errors.    Hinda Kehr, MD 04/19/18 2239

## 2018-04-19 NOTE — ED Notes (Signed)
Pt unable to urinate at this moment, given specimen cup for when is able to void. Pt sent back out to the lobby.

## 2018-05-05 IMAGING — CR DG HAND COMPLETE 3+V*R*
1 series · 3 of 3 positions shown · non-contrast
Comparison: None.

CLINICAL DATA: Right hand injury.

EXAM:
RIGHT HAND - COMPLETE 3+ VIEW

[Series 1: dg hand complete right · 0.14mm/px · 3 of 3 slices shown]
[im 1/3]
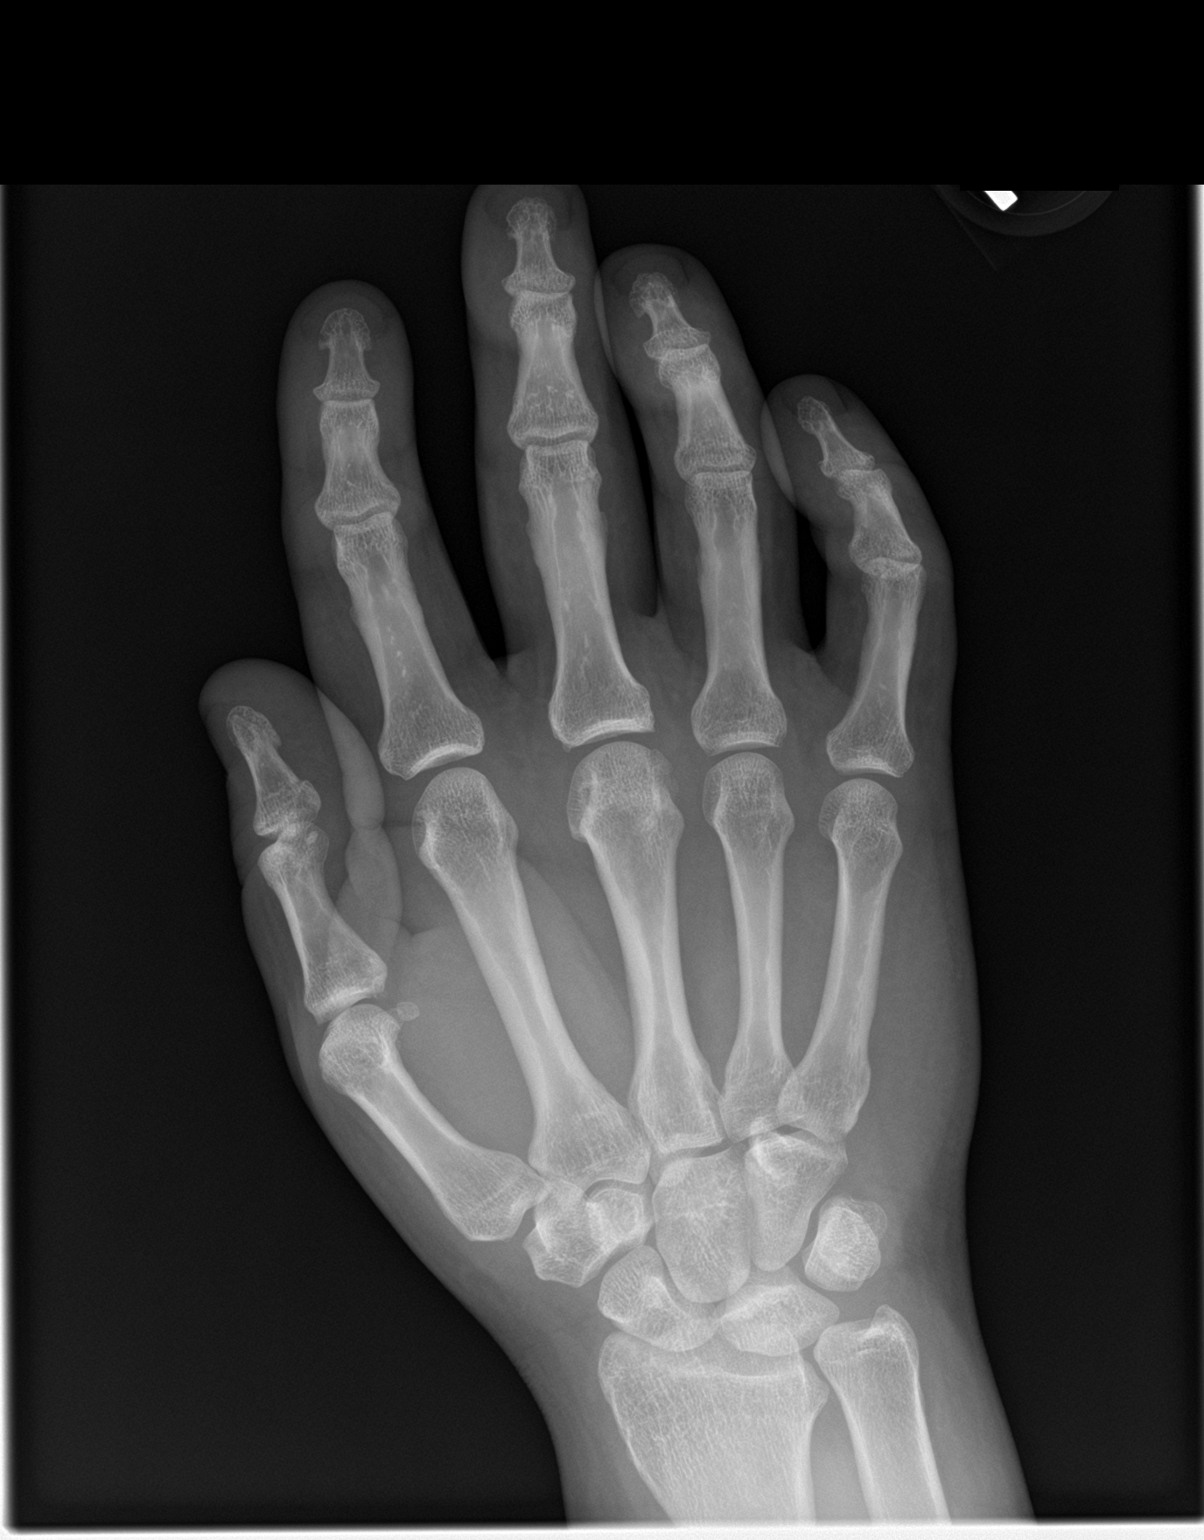
[im 2/3]
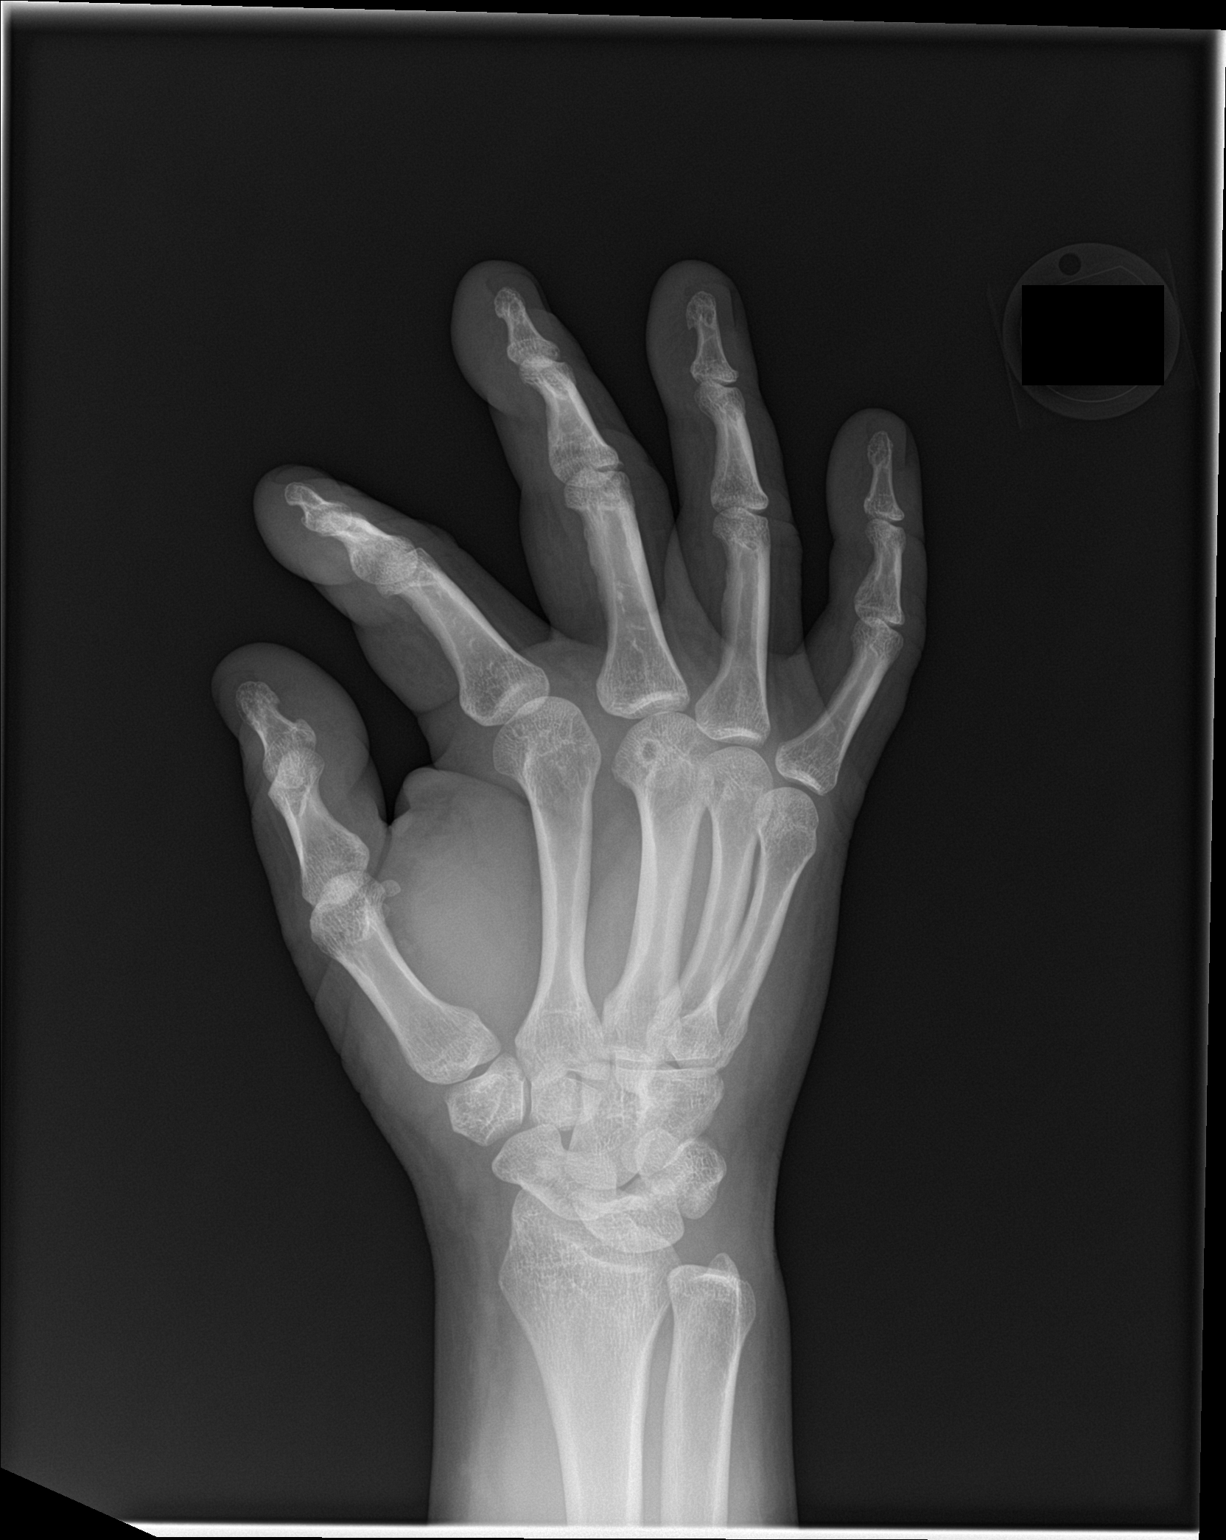
[im 3/3]
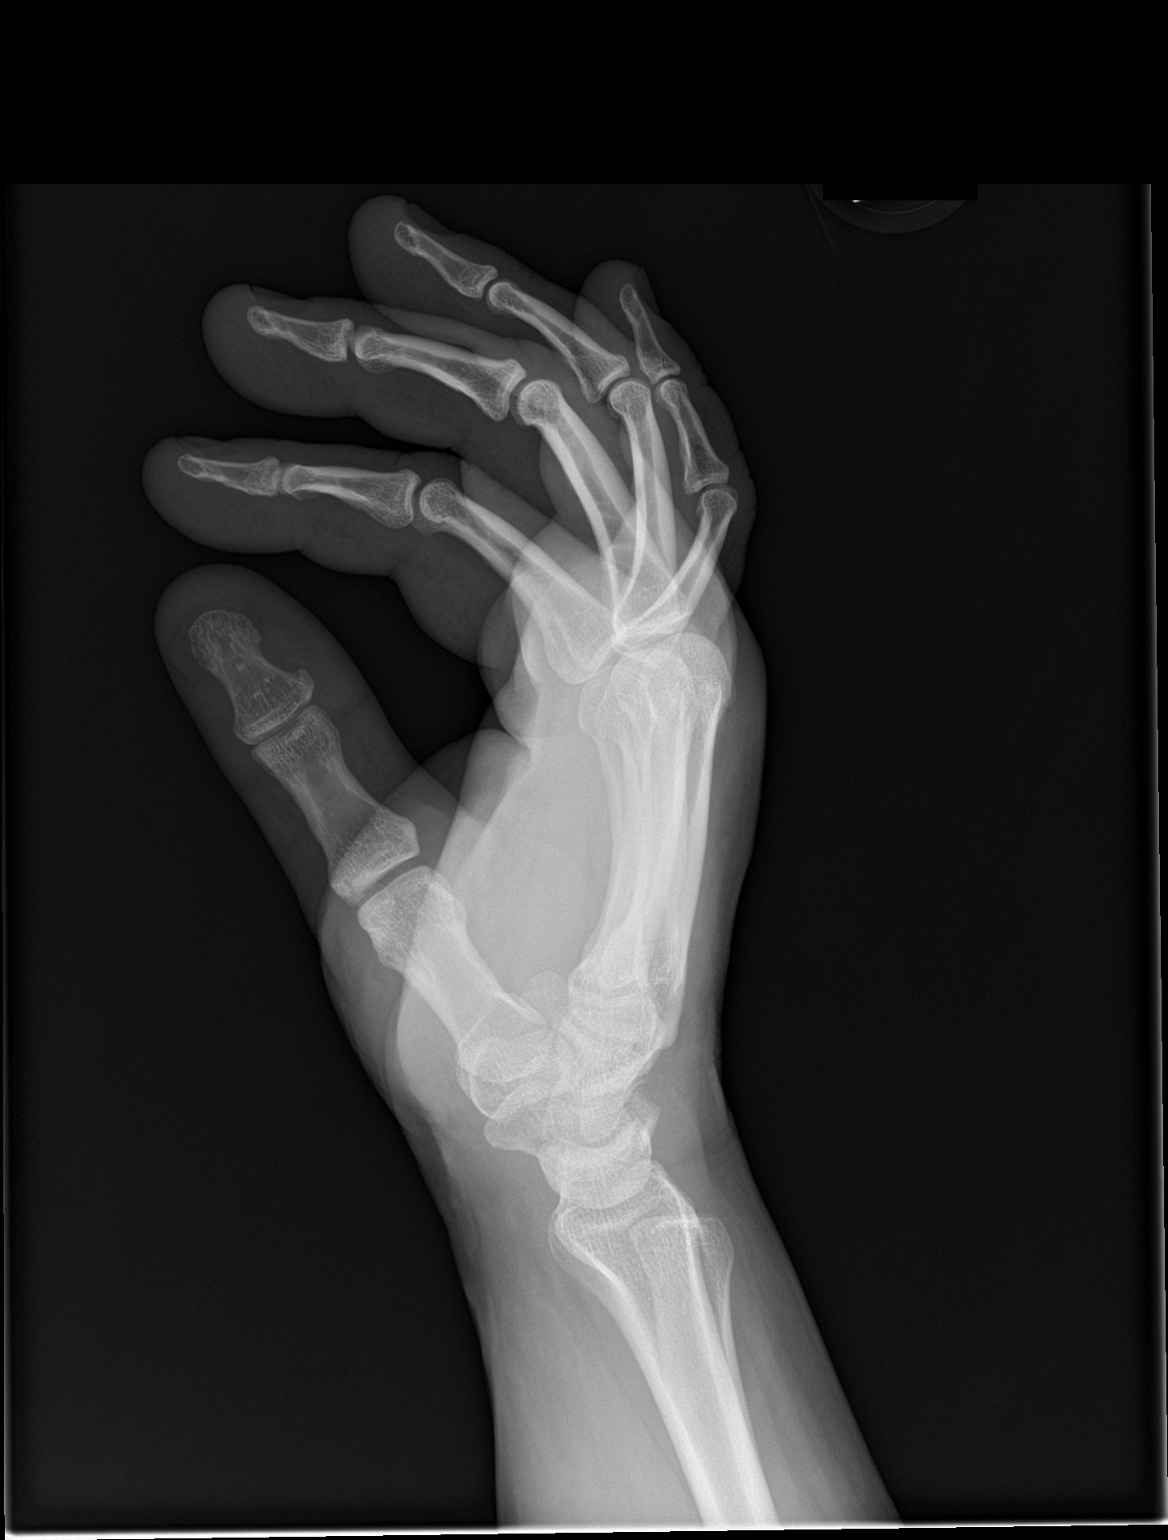

[3 of 3 positions shown; findings below may reference images not displayed]

FINDINGS: There is no evidence of fracture or dislocation. There is no
evidence of arthropathy or other focal bone abnormality. Soft
tissues are unremarkable.
IMPRESSION: Negative.

## 2018-05-25 ENCOUNTER — Encounter: Payer: Self-pay | Admitting: Emergency Medicine

## 2018-05-25 ENCOUNTER — Emergency Department: Payer: Medicare HMO

## 2018-05-25 ENCOUNTER — Inpatient Hospital Stay
Admission: EM | Admit: 2018-05-25 | Discharge: 2018-05-27 | DRG: 442 | Disposition: A | Discharge status: Home / Self Care | Payer: Medicare HMO | Attending: Family Medicine | Admitting: Family Medicine

## 2018-05-25 ENCOUNTER — Other Ambulatory Visit: Payer: Self-pay

## 2018-05-25 DIAGNOSIS — B179 Acute viral hepatitis, unspecified: Principal | ICD-10-CM | POA: Diagnosis present

## 2018-05-25 DIAGNOSIS — K219 Gastro-esophageal reflux disease without esophagitis: Secondary | ICD-10-CM | POA: Diagnosis present

## 2018-05-25 DIAGNOSIS — R079 Chest pain, unspecified: Secondary | ICD-10-CM | POA: Diagnosis not present

## 2018-05-25 DIAGNOSIS — Z955 Presence of coronary angioplasty implant and graft: Secondary | ICD-10-CM | POA: Diagnosis not present

## 2018-05-25 DIAGNOSIS — F17210 Nicotine dependence, cigarettes, uncomplicated: Secondary | ICD-10-CM | POA: Diagnosis present

## 2018-05-25 DIAGNOSIS — I255 Ischemic cardiomyopathy: Secondary | ICD-10-CM | POA: Diagnosis present

## 2018-05-25 DIAGNOSIS — F1010 Alcohol abuse, uncomplicated: Secondary | ICD-10-CM | POA: Diagnosis present

## 2018-05-25 DIAGNOSIS — Z8674 Personal history of sudden cardiac arrest: Secondary | ICD-10-CM | POA: Diagnosis not present

## 2018-05-25 DIAGNOSIS — Z720 Tobacco use: Secondary | ICD-10-CM | POA: Diagnosis not present

## 2018-05-25 DIAGNOSIS — I110 Hypertensive heart disease with heart failure: Secondary | ICD-10-CM | POA: Diagnosis present

## 2018-05-25 DIAGNOSIS — Z811 Family history of alcohol abuse and dependence: Secondary | ICD-10-CM

## 2018-05-25 DIAGNOSIS — E871 Hypo-osmolality and hyponatremia: Secondary | ICD-10-CM | POA: Diagnosis present

## 2018-05-25 DIAGNOSIS — R1011 Right upper quadrant pain: Secondary | ICD-10-CM | POA: Diagnosis present

## 2018-05-25 DIAGNOSIS — R739 Hyperglycemia, unspecified: Secondary | ICD-10-CM | POA: Diagnosis present

## 2018-05-25 DIAGNOSIS — K700 Alcoholic fatty liver: Secondary | ICD-10-CM | POA: Diagnosis present

## 2018-05-25 DIAGNOSIS — Z8249 Family history of ischemic heart disease and other diseases of the circulatory system: Secondary | ICD-10-CM | POA: Diagnosis not present

## 2018-05-25 DIAGNOSIS — D751 Secondary polycythemia: Secondary | ICD-10-CM | POA: Diagnosis present

## 2018-05-25 DIAGNOSIS — I2510 Atherosclerotic heart disease of native coronary artery without angina pectoris: Secondary | ICD-10-CM | POA: Diagnosis present

## 2018-05-25 DIAGNOSIS — I252 Old myocardial infarction: Secondary | ICD-10-CM

## 2018-05-25 DIAGNOSIS — E8809 Other disorders of plasma-protein metabolism, not elsewhere classified: Secondary | ICD-10-CM | POA: Diagnosis present

## 2018-05-25 DIAGNOSIS — K759 Inflammatory liver disease, unspecified: Secondary | ICD-10-CM

## 2018-05-25 DIAGNOSIS — R109 Unspecified abdominal pain: Secondary | ICD-10-CM

## 2018-05-25 DIAGNOSIS — K760 Fatty (change of) liver, not elsewhere classified: Secondary | ICD-10-CM | POA: Diagnosis not present

## 2018-05-25 DIAGNOSIS — R740 Nonspecific elevation of levels of transaminase and lactic acid dehydrogenase [LDH]: Secondary | ICD-10-CM | POA: Diagnosis not present

## 2018-05-25 DIAGNOSIS — I509 Heart failure, unspecified: Secondary | ICD-10-CM | POA: Diagnosis not present

## 2018-05-25 DIAGNOSIS — E860 Dehydration: Secondary | ICD-10-CM | POA: Diagnosis present

## 2018-05-25 DIAGNOSIS — F1020 Alcohol dependence, uncomplicated: Secondary | ICD-10-CM | POA: Diagnosis not present

## 2018-05-25 LAB — COMPREHENSIVE METABOLIC PANEL
ALT: 1260 U/L — ABNORMAL HIGH (ref 17–63)
AST: 4084 U/L — ABNORMAL HIGH (ref 15–41)
Albumin: 3.4 g/dL — ABNORMAL LOW (ref 3.5–5.0)
Alkaline Phosphatase: 129 U/L — ABNORMAL HIGH (ref 38–126)
Anion gap: 14 (ref 5–15)
BUN: 7 mg/dL (ref 6–20)
CO2: 15 mmol/L — ABNORMAL LOW (ref 22–32)
Calcium: 9 mg/dL (ref 8.9–10.3)
Chloride: 102 mmol/L (ref 101–111)
Creatinine, Ser: 0.63 mg/dL (ref 0.61–1.24)
GFR calc Af Amer: >60 mL/min (ref >60)
GFR calc non Af Amer: >60 mL/min (ref >60)
Glucose, Bld: 102 mg/dL — ABNORMAL HIGH (ref 65–99)
Potassium: 4.4 mmol/L (ref 3.5–5.1)
Sodium: 131 mmol/L — ABNORMAL LOW (ref 135–145)
Total Bilirubin: 5.7 mg/dL — ABNORMAL HIGH (ref 0.3–1.2)
Total Protein: 7.6 g/dL (ref 6.5–8.1)

## 2018-05-25 LAB — CBC
HCT: 53.3 % — ABNORMAL HIGH (ref 40.0–52.0)
Hemoglobin: 18.8 g/dL — ABNORMAL HIGH (ref 13.0–18.0)
MCH: 38.6 pg — ABNORMAL HIGH (ref 26.0–34.0)
MCHC: 35.3 g/dL (ref 32.0–36.0)
MCV: 109.5 fL — ABNORMAL HIGH (ref 80.0–100.0)
Platelets: 213 10*3/uL (ref 150–440)
RBC: 4.87 MIL/uL (ref 4.40–5.90)
RDW: 14.9 % — ABNORMAL HIGH (ref 11.5–14.5)
WBC: 10.5 10*3/uL (ref 3.8–10.6)

## 2018-05-25 LAB — IRON AND TIBC
Iron: 98 ug/dL (ref 45–182)
Saturation Ratios: 34 % (ref 17.9–39.5)
TIBC: 289 ug/dL (ref 250–450)
UIBC: 191 ug/dL

## 2018-05-25 LAB — URINALYSIS, COMPLETE (UACMP) WITH MICROSCOPIC
Bacteria, UA: NONE SEEN
Glucose, UA: NEGATIVE mg/dL
Hgb urine dipstick: NEGATIVE
Ketones, ur: NEGATIVE mg/dL
Leukocytes, UA: NEGATIVE
Nitrite: NEGATIVE
Protein, ur: 30 mg/dL — AB
Specific Gravity, Urine: 1.019 (ref 1.005–1.030)
pH: 5 (ref 5.0–8.0)

## 2018-05-25 LAB — FERRITIN: Ferritin: 4528 ng/mL — ABNORMAL HIGH (ref 24–336)

## 2018-05-25 LAB — URINE DRUG SCREEN, QUALITATIVE (ARMC ONLY)
Amphetamines, Ur Screen: NOT DETECTED
Barbiturates, Ur Screen: NOT DETECTED
Benzodiazepine, Ur Scrn: NOT DETECTED
Cannabinoid 50 Ng, Ur ~~LOC~~: POSITIVE — AB
Cocaine Metabolite,Ur ~~LOC~~: POSITIVE — AB
MDMA (Ecstasy)Ur Screen: NOT DETECTED
Methadone Scn, Ur: NOT DETECTED
Opiate, Ur Screen: POSITIVE — AB
Phencyclidine (PCP) Ur S: NOT DETECTED
Tricyclic, Ur Screen: NOT DETECTED

## 2018-05-25 LAB — ETHANOL: Alcohol, Ethyl (B): <10 mg/dL (ref <10)

## 2018-05-25 LAB — TROPONIN I: Troponin I: <0.03 ng/mL (ref <0.03)

## 2018-05-25 LAB — PROTIME-INR
INR: 1.6
Prothrombin Time: 18.9 seconds — ABNORMAL HIGH (ref 11.4–15.2)

## 2018-05-25 LAB — MAGNESIUM: Magnesium: 1.7 mg/dL (ref 1.7–2.4)

## 2018-05-25 LAB — PHOSPHORUS: Phosphorus: 3.9 mg/dL (ref 2.5–4.6)

## 2018-05-25 LAB — APTT: aPTT: 32 seconds (ref 24–36)

## 2018-05-25 LAB — LIPASE, BLOOD: Lipase: 24 U/L (ref 11–51)

## 2018-05-25 IMAGING — DX DG CHEST 1V PORT
1 series · 1 of 1 positions shown · non-contrast
Comparison: [DATE]

CLINICAL DATA: Chest pain.

EXAM:
PORTABLE CHEST 1 VIEW

[chest ap]
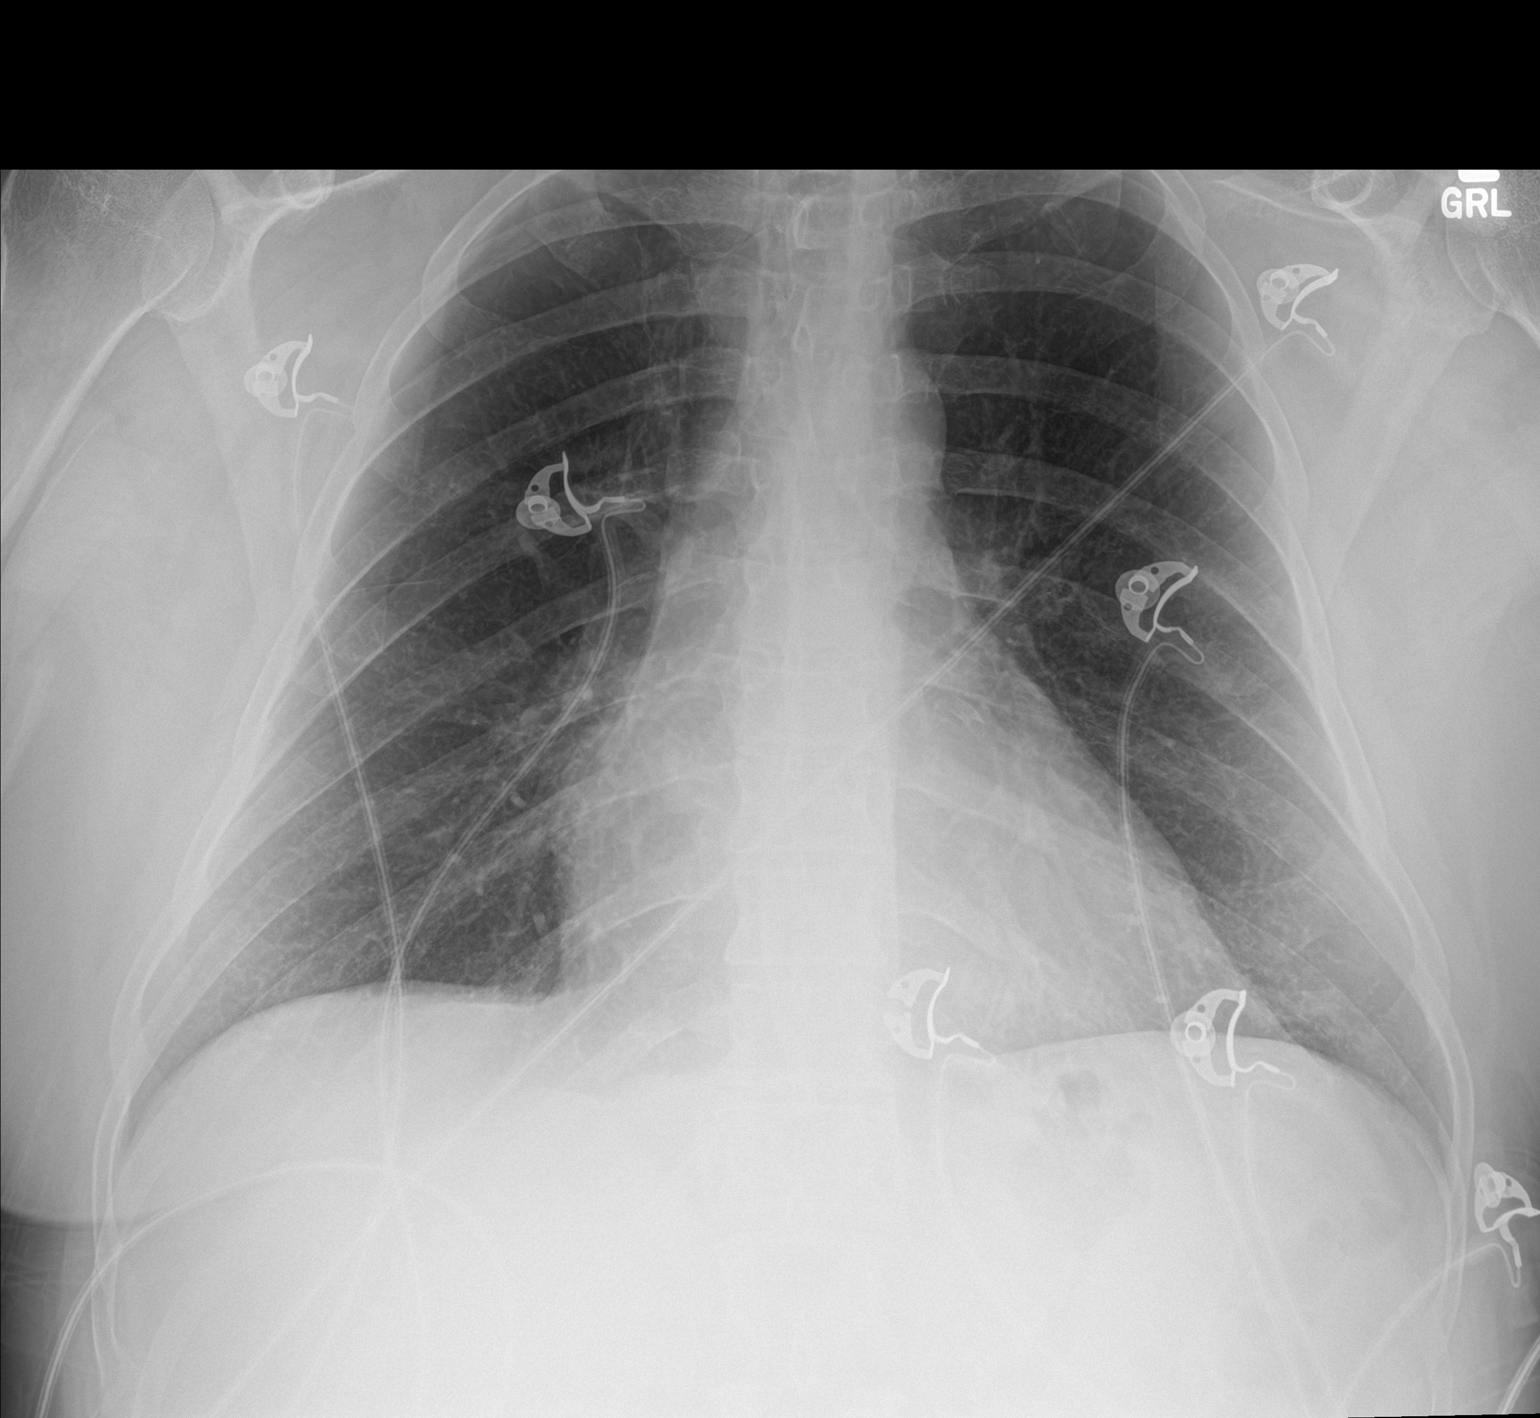

[1 of 1 positions shown; findings below may reference images not displayed]

FINDINGS: Mild cardiomegaly with normal mediastinal contours. No focal
airspace disease, pleural effusion, pulmonary edema or pneumothorax.
No acute osseous abnormalities.
IMPRESSION: Mild cardiomegaly without congestive failure or acute pulmonary
process.

## 2018-05-25 IMAGING — US US ABDOMEN LIMITED
1 series · 14 of 25 positions shown · non-contrast
Comparison: CT and ultrasound [DATE]

CLINICAL DATA: Abdominal pain.

EXAM:
ULTRASOUND ABDOMEN LIMITED RIGHT UPPER QUADRANT

[Series 1: us abdomen limited · 14 of 43 slices shown]
[im 1/43]
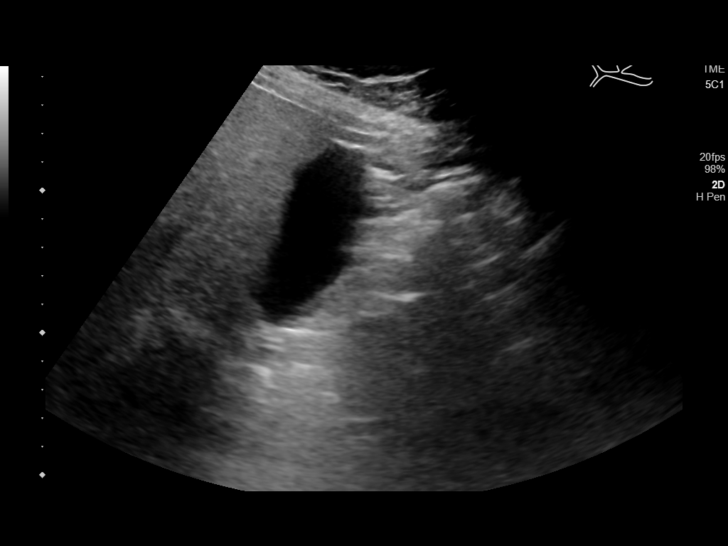
[im 4/43]
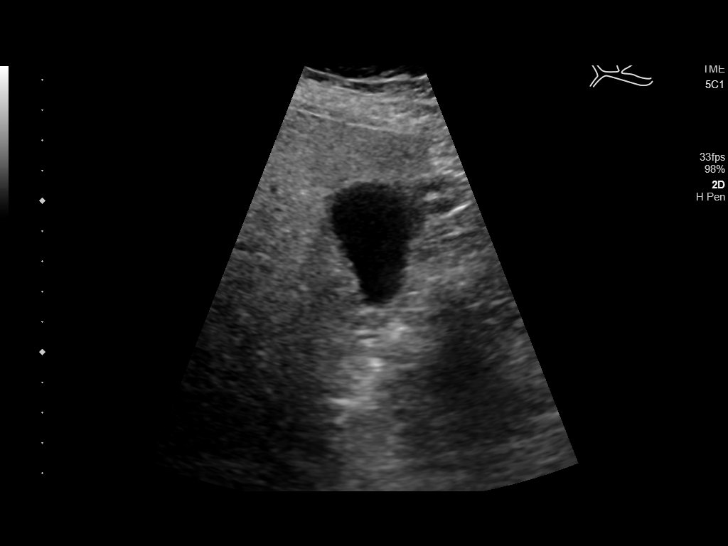
[im 8/43]
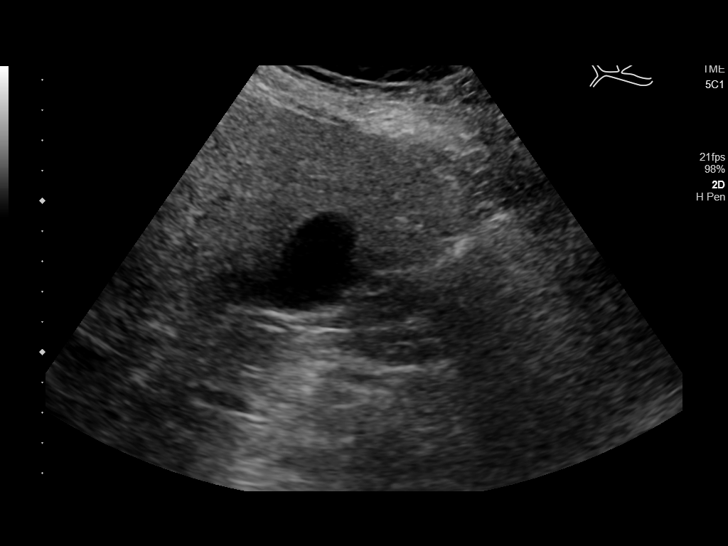
[im 11/43]
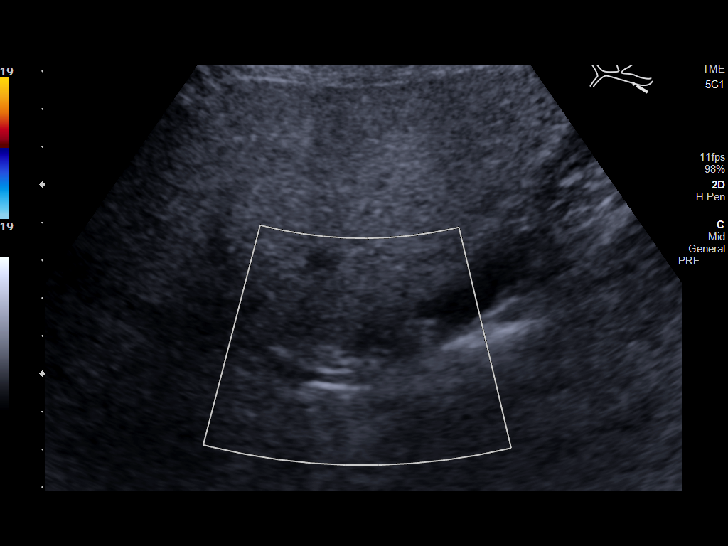
[im 15/43]
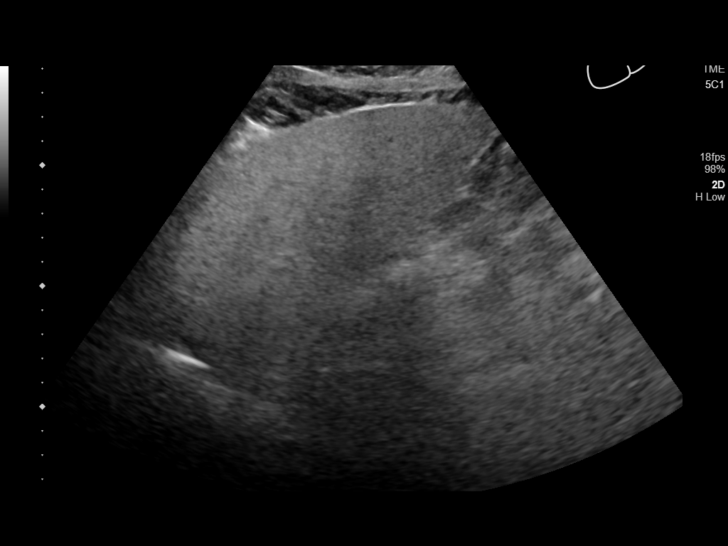
[im 16/43]
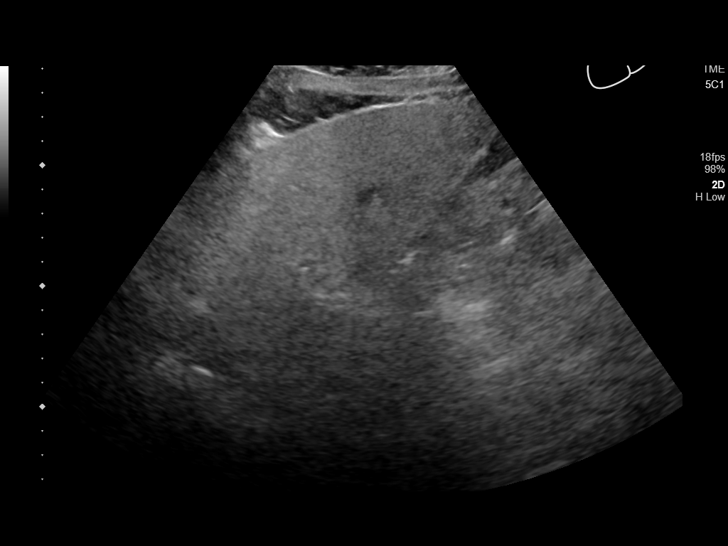
[im 20/43]
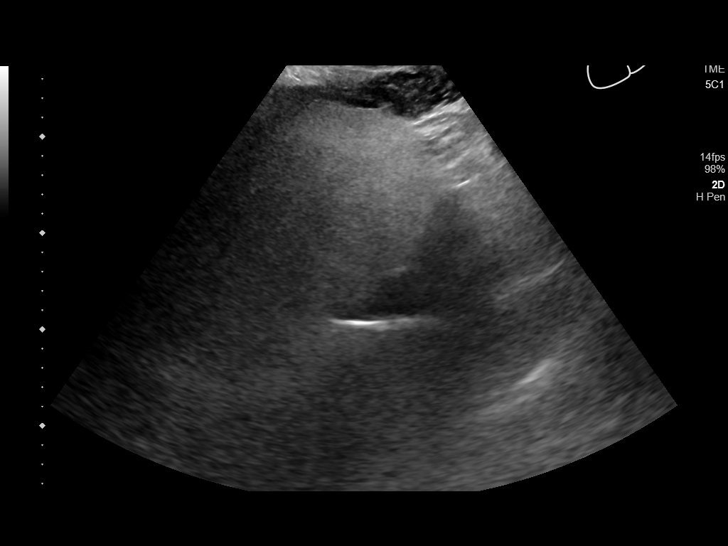
[im 23/43]
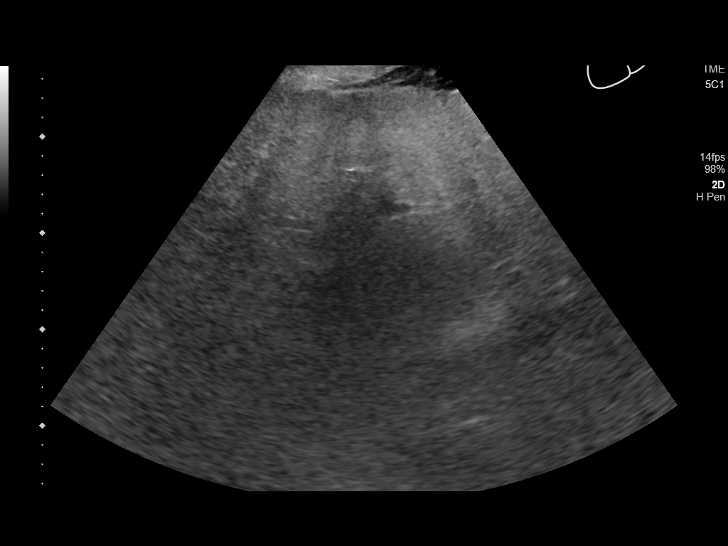
[im 27/43]
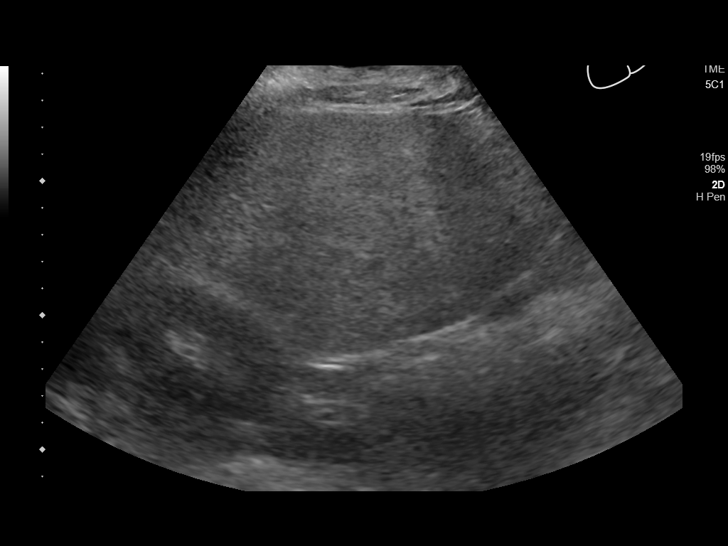
[im 29/43]
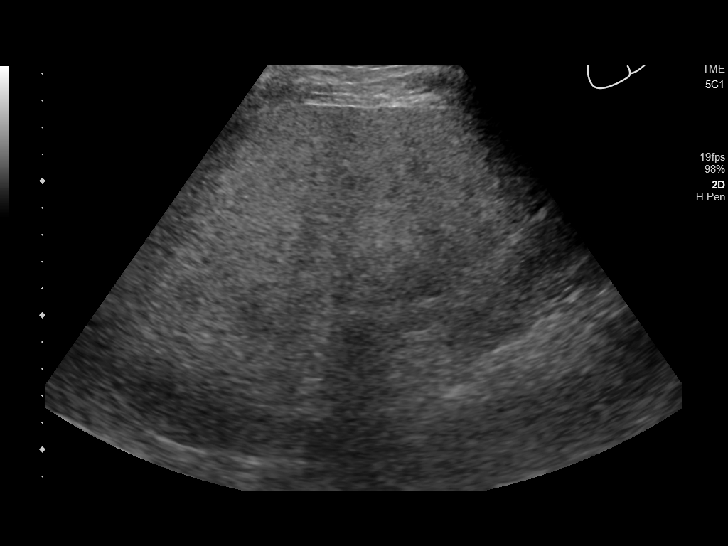
[im 32/43]
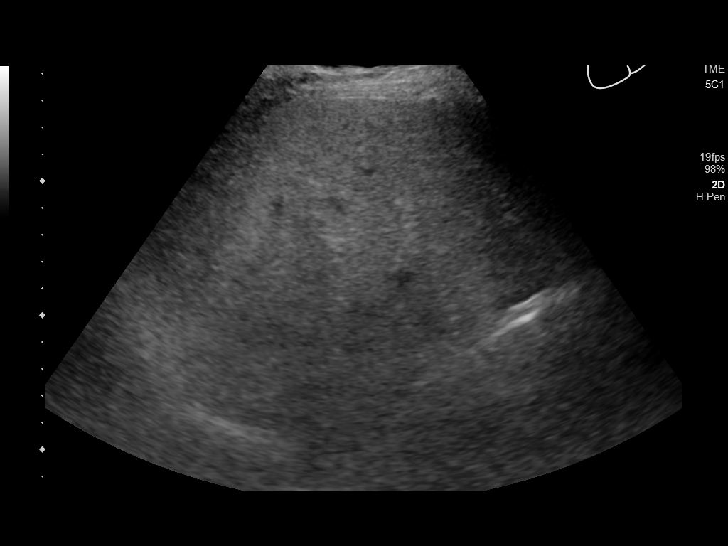
[im 36/43]
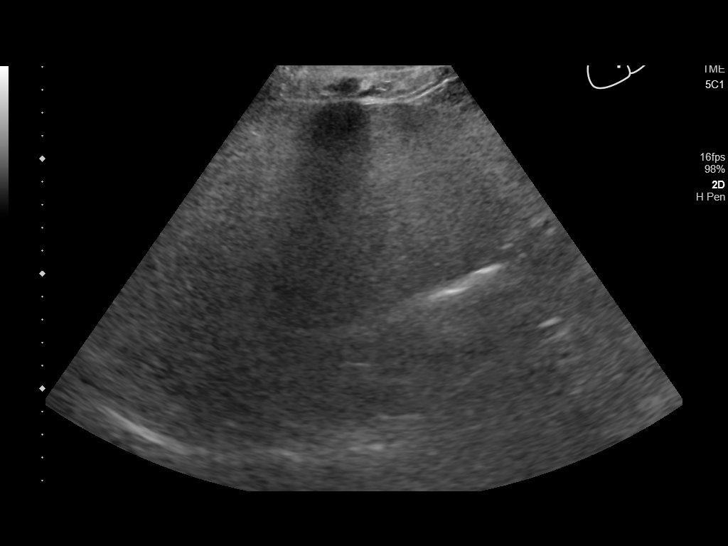
[im 39/43]
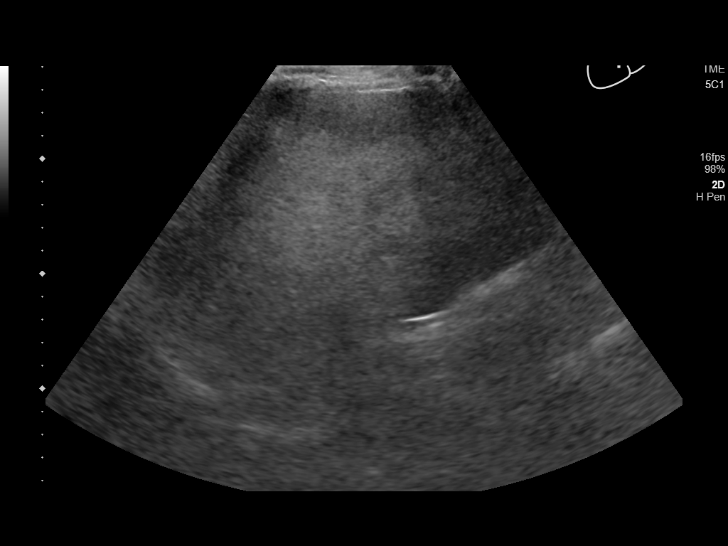
[im 43/43]
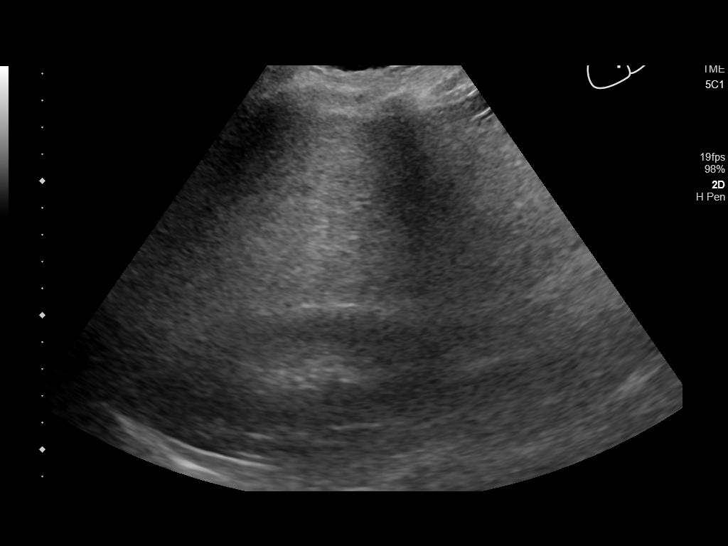

[14 of 25 positions shown; findings below may reference images not displayed]

FINDINGS: Gallbladder:

Physiologically distended. No gallstones or wall thickening
visualized. No sonographic Murphy sign noted by sonographer.

Common bile duct:

Diameter: 3 mm, normal.

Liver:

No focal lesion identified. Diffusely increased and heterogeneous in
parenchymal echogenicity. Portal vein is patent on color Doppler
imaging with normal direction of blood flow towards the liver.
IMPRESSION: 1. Hepatic steatosis.
2. Normal sonographic appearance of the gallbladder and biliary
tree.

## 2018-05-25 MED ORDER — ENOXAPARIN SODIUM 40 MG/0.4ML ~~LOC~~ SOLN
40.0000 mg | SUBCUTANEOUS | Status: DC
  Administered 2018-05-25: 40 mg via SUBCUTANEOUS
  Filled 2018-05-25: qty 0.4

## 2018-05-25 MED ORDER — SODIUM CHLORIDE 0.9 % IV SOLN
INTRAVENOUS | Status: DC
  Administered 2018-05-25 – 2018-05-26 (×4): via INTRAVENOUS

## 2018-05-25 MED ORDER — THIAMINE HCL 100 MG/ML IJ SOLN: 100.0000 mg | Freq: Every day | INTRAMUSCULAR | Status: DC

## 2018-05-25 MED ORDER — MORPHINE SULFATE (PF) 4 MG/ML IV SOLN
4.0000 mg | Freq: Once | INTRAVENOUS | Status: AC
Start: 2018-05-25 — End: 2018-05-25
  Administered 2018-05-25: 4 mg via INTRAVENOUS
  Filled 2018-05-25: qty 1

## 2018-05-25 MED ORDER — ONDANSETRON HCL 4 MG PO TABS: 4.0000 mg | ORAL_TABLET | Freq: Four times a day (QID) | ORAL | Status: DC | PRN

## 2018-05-25 MED ORDER — FOLIC ACID 1 MG PO TABS
1.0000 mg | ORAL_TABLET | Freq: Every day | ORAL | Status: DC
  Administered 2018-05-25 – 2018-05-27 (×3): 1 mg via ORAL
  Filled 2018-05-25 (×3): qty 1

## 2018-05-25 MED ORDER — MORPHINE SULFATE (PF) 2 MG/ML IV SOLN: 2.0000 mg | INTRAVENOUS | Status: DC | PRN

## 2018-05-25 MED ORDER — MORPHINE SULFATE (PF) 4 MG/ML IV SOLN
4.0000 mg | Freq: Once | INTRAVENOUS | Status: DC
  Filled 2018-05-25: qty 1

## 2018-05-25 MED ORDER — MAGNESIUM SULFATE IN D5W 1-5 GM/100ML-% IV SOLN
1.0000 g | Freq: Once | INTRAVENOUS | Status: AC
  Administered 2018-05-25: 1 g via INTRAVENOUS
  Filled 2018-05-25 (×2): qty 100

## 2018-05-25 MED ORDER — SENNOSIDES-DOCUSATE SODIUM 8.6-50 MG PO TABS: 1.0000 | ORAL_TABLET | Freq: Every evening | ORAL | Status: DC | PRN

## 2018-05-25 MED ORDER — MORPHINE SULFATE (PF) 4 MG/ML IV SOLN
4.0000 mg | INTRAVENOUS | Status: DC | PRN
  Administered 2018-05-25 – 2018-05-27 (×16): 4 mg via INTRAVENOUS
  Filled 2018-05-25 (×15): qty 1

## 2018-05-25 MED ORDER — ADULT MULTIVITAMIN W/MINERALS CH
1.0000 | ORAL_TABLET | Freq: Every day | ORAL | Status: DC
  Administered 2018-05-26 – 2018-05-27 (×2): 1 via ORAL
  Filled 2018-05-25 (×2): qty 1

## 2018-05-25 MED ORDER — LORAZEPAM 1 MG PO TABS: 1.0000 mg | ORAL_TABLET | Freq: Four times a day (QID) | ORAL | Status: DC | PRN

## 2018-05-25 MED ORDER — SODIUM CHLORIDE 0.9 % IV BOLUS
1000.0000 mL | Freq: Once | INTRAVENOUS | Status: AC
  Administered 2018-05-25: 1000 mL via INTRAVENOUS

## 2018-05-25 MED ORDER — ONDANSETRON HCL 4 MG/2ML IJ SOLN
4.0000 mg | Freq: Once | INTRAMUSCULAR | Status: AC
  Administered 2018-05-25: 4 mg via INTRAVENOUS
  Filled 2018-05-25: qty 2

## 2018-05-25 MED ORDER — PANTOPRAZOLE SODIUM 40 MG PO TBEC
40.0000 mg | DELAYED_RELEASE_TABLET | Freq: Every day | ORAL | Status: DC
  Administered 2018-05-25 – 2018-05-27 (×3): 40 mg via ORAL
  Filled 2018-05-25 (×3): qty 1

## 2018-05-25 MED ORDER — ENSURE ENLIVE PO LIQD
237.0000 mL | Freq: Two times a day (BID) | ORAL | Status: DC
  Administered 2018-05-26 – 2018-05-27 (×3): 237 mL via ORAL

## 2018-05-25 MED ORDER — VITAMIN B-1 100 MG PO TABS
100.0000 mg | ORAL_TABLET | Freq: Every day | ORAL | Status: DC
  Administered 2018-05-25 – 2018-05-27 (×3): 100 mg via ORAL
  Filled 2018-05-25 (×3): qty 1

## 2018-05-25 MED ORDER — ONDANSETRON HCL 4 MG/2ML IJ SOLN
4.0000 mg | Freq: Four times a day (QID) | INTRAMUSCULAR | Status: DC | PRN
Start: 2018-05-25 — End: 2018-05-27

## 2018-05-25 MED ORDER — BISACODYL 5 MG PO TBEC: 5.0000 mg | DELAYED_RELEASE_TABLET | Freq: Every day | ORAL | Status: DC | PRN

## 2018-05-25 MED ORDER — SODIUM CHLORIDE 0.9 % IV SOLN
INTRAVENOUS | Status: AC
  Administered 2018-05-25 (×2): via INTRAVENOUS

## 2018-05-25 MED ORDER — LORAZEPAM 2 MG/ML IJ SOLN: 1.0000 mg | Freq: Four times a day (QID) | INTRAMUSCULAR | Status: DC | PRN

## 2018-05-25 NOTE — ED Notes (Signed)
abd pain since Thursday; right upper quadrant and left flank; tender on palpation; bs 101; 145/82; HR 99; oxygen 99 room air

## 2018-05-25 NOTE — Progress Notes (Signed)
Unsure why oxgyen was ordered. On call medical doctor notified and gave order to change oxygen to prn

## 2018-05-25 NOTE — H&P (Addendum)
[facility] NAME: Ethan Wells    MR#:  [id number]  DATE OF BIRTH:  [birth year]  DATE OF ADMISSION:  05/25/2018  PRIMARY CARE PHYSICIAN: Katheren Shams   REQUESTING/REFERRING PHYSICIAN: Loney Hering, MD  CHIEF COMPLAINT:   Chief Complaint  Patient presents with  . Abdominal Pain    HISTORY OF PRESENT ILLNESS:  Ethan Wells  is a 47 y.o. male with a known history of EtOH abuse/dependence p/w RUQ AP. Endorses sharp RUQ AP x2d. Endorses nausea. Only had one episode of vomiting, characterized as "dark brown". Denies hematemesis, melena/hematochezia. States he drinks 6-12 beers per day. Has not drank in 3d. Denies withdrawal symptoms. Endorses headache. Is otherwise w/o complaint. Denies jaundice or leg edema. Does not appear icteric or tremulous/diaphoretic on exam. His vitals are stable.  PAST MEDICAL HISTORY:   Past Medical History:  Diagnosis Date  . CAD (coronary artery disease)    a. 01/2011 Anterior STEMI/Cath/PCI: LM nl, LAD 100d (3.5x5mm Vision BMS placed), LCX 74m, RI 50, RCA min irregs, EF 40% w/ apical, inferoapical HK.  . Cardiac arrest - ventricular fibrillation    a. In setting of STEMI 01/2011  . Cocaine abuse ([place])   . ETOH abuse    a. 6-12 beers/day  . Hemorrhoids   . Hypertension   . Ischemic cardiomyopathy    a. 06/2011 Echo: EF 45-50%, No rwma  . Marijuana abuse   . Tobacco abuse    a. 1/2 ppd x 26 yrs    PAST SURGICAL HISTORY:  History reviewed. No pertinent surgical history.  SOCIAL HISTORY:   Social History   Tobacco Use  . Smoking status: Current Every Day Smoker    Packs/day: 0.50    Years: 26.00    Pack years: 13.00    Types: Cigarettes  . Smokeless tobacco: Never Used  Substance Use Topics  . Alcohol use: Yes    Comment: at least a 6 pack of beer daily and often a 12 pack    FAMILY HISTORY:   Family History  Problem Relation Age of Onset  . Coronary artery  disease Mother        alive  . Other Unknown        2 sisters alive and well  . Alcohol abuse Father        died @ 28    DRUG ALLERGIES:   Allergies  Allergen Reactions  . Penicillins Hives    REVIEW OF SYSTEMS:   Review of Systems  Constitutional: Negative for chills, diaphoresis, fever, malaise/fatigue and weight loss.  HENT: Negative for congestion, ear pain, hearing loss, nosebleeds, sinus pain, sore throat and tinnitus.   Eyes: Negative for blurred vision, double vision, photophobia, pain, discharge and redness.  Respiratory: Negative for cough, hemoptysis, sputum production, shortness of breath and wheezing.   Cardiovascular: Negative for chest pain, palpitations, orthopnea, claudication, leg swelling and PND.  Gastrointestinal: Positive for abdominal pain, nausea and vomiting. Negative for blood in stool, constipation, diarrhea, heartburn and melena.  Genitourinary: Negative for dysuria, frequency, hematuria and urgency.  Musculoskeletal: Negative for back pain, joint pain, myalgias and neck pain.  Skin: Negative for itching and rash.  Neurological: Positive for headaches. Negative for dizziness, tingling, tremors, sensory change, speech change, focal weakness, seizures, loss of consciousness and weakness.  Psychiatric/Behavioral: Negative for memory loss. The patient does not have insomnia.    MEDICATIONS AT HOME:   Prior to Admission medications   Medication  Sig Start Date End Date Taking? Authorizing Provider  omeprazole (PRILOSEC) 20 MG capsule Take 1 capsule (20 mg total) by mouth 2 (two) times daily. 04/19/18 04/19/19 Yes Hinda Kehr, MD      VITAL SIGNS:  Blood pressure 97/65, pulse 80, temperature 97.8 F (36.6 C), temperature source Oral, resp. rate 14, height 5\' 5"  (1.651 m), weight 77.1 kg (170 lb), SpO2 98 %.  PHYSICAL EXAMINATION:  Physical Exam  Constitutional: He is oriented to person, place, and time. He appears well-developed and well-nourished. He is  active and cooperative.  Non-toxic appearance. He does not have a sickly appearance. He does not appear ill. No distress. He is not intubated.  HENT:  Head: Normocephalic and atraumatic.  Mouth/Throat: Oropharynx is clear and moist. Mucous membranes are dry. No oropharyngeal exudate.  Eyes: Conjunctivae, EOM and lids are normal. No scleral icterus.  Neck: Neck supple. No JVD present. No thyromegaly present.  Cardiovascular: Normal rate, regular rhythm, S1 normal, S2 normal and normal heart sounds.  No extrasystoles are present. Exam reveals no gallop, no S3, no S4, no distant heart sounds and no friction rub.  No murmur heard. Pulmonary/Chest: Effort normal and breath sounds normal. No accessory muscle usage or stridor. No apnea, no tachypnea and no bradypnea. He is not intubated. No respiratory distress. He has no decreased breath sounds. He has no wheezes. He has no rhonchi. He has no rales.  Abdominal: Soft. Bowel sounds are normal. He exhibits no distension. There is tenderness in the right upper quadrant and epigastric area. There is no rigidity, no rebound and no guarding.  Musculoskeletal: Normal range of motion. He exhibits no edema or tenderness.  Lymphadenopathy:    He has no cervical adenopathy.  Neurological: He is alert and oriented to person, place, and time. He is not disoriented.  Skin: Skin is warm, dry and intact. No rash noted. He is not diaphoretic. No erythema.  Psychiatric: He has a normal mood and affect. His speech is normal and behavior is normal. Judgment and thought content normal. Cognition and memory are normal.   LABORATORY PANEL:   CBC Recent Labs  Lab 05/25/18 0337  WBC 10.5  HGB 18.8*  HCT 53.3*  PLT 213   ------------------------------------------------------------------------------------------------------------------  Chemistries  Recent Labs  Lab 05/25/18 0337  NA 131*  K 4.4  CL 102  CO2 15*  GLUCOSE 102*  BUN 7  CREATININE 0.63  CALCIUM  9.0  AST 4,084*  ALT 1,260*  ALKPHOS 129*  BILITOT 5.7*   ------------------------------------------------------------------------------------------------------------------  Cardiac Enzymes Recent Labs  Lab 05/25/18 0337  TROPONINI <0.03   ------------------------------------------------------------------------------------------------------------------  RADIOLOGY:  Dg Chest Portable 1 View  Result Date: 05/25/2018 CLINICAL DATA:  Chest pain. EXAM: PORTABLE CHEST 1 VIEW COMPARISON:  09/06/2013 FINDINGS: Mild cardiomegaly with normal mediastinal contours. No focal airspace disease, pleural effusion, pulmonary edema or pneumothorax. No acute osseous abnormalities. IMPRESSION: Mild cardiomegaly without congestive failure or acute pulmonary process. Electronically Signed   By: Jeb Levering M.D.   On: 05/25/2018 04:32   US Abdomen Limited Ruq  Result Date: 05/25/2018 CLINICAL DATA:  Abdominal pain. EXAM: ULTRASOUND ABDOMEN LIMITED RIGHT UPPER QUADRANT COMPARISON:  CT and ultrasound 04/19/2018 FINDINGS: Gallbladder: Physiologically distended. No gallstones or wall thickening visualized. No sonographic Murphy sign noted by sonographer. Common bile duct: Diameter: 3 mm, normal. Liver: No focal lesion identified. Diffusely increased and heterogeneous in parenchymal echogenicity. Portal vein is patent on color Doppler imaging with normal direction of blood flow towards the liver. IMPRESSION:  1. Hepatic steatosis. 2. Normal sonographic appearance of the gallbladder and biliary tree. Electronically Signed   By: Jeb Levering M.D.   On: 05/25/2018 05:24   IMPRESSION AND PLAN:   A/P: 46M EtOH abuse/dependence p/w RUQ AP, transaminasemia/hyperbilirubinemia, alcoholic fatty liver disease. Suspect acute steatohepatitis/alcoholic hepatitis. Also w/ hyponatremia, hyperglycemia, hypoalbuminemia, macrocytosis, polycythemia, dehydration/hemoconcentration. -N/V, RUQ AP, (-) F, (+) hyperbilirubinemia, (-)  jaundice/icterus -EtOH qD -CIWA -Thiamine, folate, MVI -Imaging (+) alcoholic fatty liver disease -Suspect steatohepatitis/alcoholic hepatitis -Coags pending, unable to calculate Maddrey's Discriminant Function without this data. Pt may ultimately benefit from steroids/pentoxifylline -GI consult -IVF NS -Macrocytosis likely 2/2 EtOH abuse -Polycythemia 2/2 dehydration/hemoconcentration -No Tylenol -Symptomatic mgmt, pain ctrl, antiemetics -c/w home meds/formulary subs -Cardiac diet -Lovenox -Full code -Admission, > 2 midnights   All the records are reviewed and case discussed with ED provider. Management plans discussed with the patient, family and they are in agreement.  CODE STATUS: Full code  TOTAL TIME TAKING CARE OF THIS PATIENT: 60 minutes.    Arta Silence M.D on 05/25/2018 at 7:38 AM  Between 7am to 6pm - Pager - [phone]  After 6pm go to [email or web address] - Proofreader  [facility]  Office  [phone]  CC: Primary care physician; Katheren Shams   Note: This dictation was prepared with Dragon dictation along with smaller phrase technology. Any transcriptional errors that result from this process are unintentional.

## 2018-05-25 NOTE — ED Provider Notes (Signed)
[facility] Emergency Department Provider Note   ____________________________________________   First MD Initiated Contact with Patient 05/25/18 0330     (approximate)  I have reviewed the triage vital signs and the nursing notes.   HISTORY  Chief Complaint Abdominal Pain    HPI Ethan Wells is a 47 y.o. male who comes into the hospital today with some abdominal pain for the past 4 days.  He reports that it is in his right upper quadrant and it comes and goes.  He had similar symptoms about a month ago and was seen here and told everything was normal.  The patient is also had some left flank pain.  He has a history of MI and CHF.  The patient also drinks every day.  He states that he is always had a problem with heartburn and acid reflux.  He is unsure what makes the pain come on.  He rates his pain a 10 out of 10 in intensity currently.  The patient denies any nausea or vomiting.  He states that he gags if he tries to drink anything.  He he has had some dizziness and shortness of breath in his abdomen when he tries to breathe.  The patient is here today for evaluation of his symptoms.   Past Medical History:  Diagnosis Date  . CAD (coronary artery disease)    a. 01/2011 Anterior STEMI/Cath/PCI: LM nl, LAD 100d (3.5x29m Vision BMS placed), LCX 248mRI 50, RCA min irregs, EF 40% w/ apical, inferoapical HK.  . Cardiac arrest - ventricular fibrillation    a. In setting of STEMI 01/2011  . Cocaine abuse ([place]  . ETOH abuse    a. 6-12 beers/day  . Hemorrhoids   . Hypertension   . Ischemic cardiomyopathy    a. 06/2011 Echo: EF 45-50%, No rwma  . Marijuana abuse   . Tobacco abuse    a. 1/2 ppd x 26 yrs    Patient Active Problem List   Diagnosis Date Noted  . Chest pain, mid sternal 11/17/2012  . [place] abuse ([place]  . Marijuana abuse   . TOBACCO ABUSE 02/26/2011  . HYPERTENSION, BENIGN 02/26/2011  . CAD, NATIVE VESSEL 02/26/2011  . Other  specified forms of chronic ischemic heart disease 02/26/2011    History reviewed. No pertinent surgical history.  Prior to Admission medications   Medication Sig Start Date End Date Taking? Authorizing Provider  omeprazole (PRILOSEC) 20 MG capsule Take 1 capsule (20 mg total) by mouth 2 (two) times daily. 04/19/18 04/19/19 Yes FoHinda KehrMD    Allergies Penicillins  Family History  Problem Relation Age of Onset  . Coronary artery disease Mother        alive  . Other Unknown        2 sisters alive and well  . Alcohol abuse Father        died @ 5920  Social History Social History   Tobacco Use  . Smoking status: Current Every Day Smoker    Packs/day: 0.50    Years: 26.00    Pack years: 13.00    Types: Cigarettes  . Smokeless tobacco: Never Used  Substance Use Topics  . Alcohol use: Yes    Comment: at least a 6 pack of beer daily and often a 12 pack  . Drug use: Yes    Types: Cocaine, Marijuana    Comment: denies cocaine; marijuana 2 days ago    Review of  Systems  Constitutional: No fever/chills Eyes: No visual changes. ENT: No sore throat. Cardiovascular: Denies chest pain. Respiratory: Denies shortness of breath. Gastrointestinal:  abdominal pain.   nausea, no vomiting.  No diarrhea.  No constipation. Genitourinary: Negative for dysuria. Musculoskeletal: Negative for back pain. Skin: Negative for rash. Neurological: Negative for headaches, focal weakness or numbness.   ____________________________________________   PHYSICAL EXAM:  VITAL SIGNS: ED Triage Vitals  Enc Vitals Group     BP 05/25/18 0333 111/80     Pulse Rate 05/25/18 0333 82     Resp 05/25/18 0333 20     Temp 05/25/18 0333 97.8 F (36.6 C)     Temp Source 05/25/18 0333 Oral     SpO2 05/25/18 0333 93 %     Weight 05/25/18 0334 170 lb (77.1 kg)     Height 05/25/18 0334 '5\' 5"'  (1.651 m)     Head Circumference --      Peak Flow --      Pain Score 05/25/18 0333 10     Pain Loc --       Pain Edu? --      Excl. in [place]? --     Constitutional: Alert and oriented. Well appearing and in moderate distress. Eyes: Conjunctivae are normal. PERRL. EOMI. Head: Atraumatic. Nose: No congestion/rhinnorhea. Mouth/Throat: Mucous membranes are moist.  Oropharynx non-erythematous. Cardiovascular: Normal rate, regular rhythm. Grossly normal heart sounds.  Good peripheral circulation. Respiratory: Normal respiratory effort.  No retractions. Lungs CTAB. Gastrointestinal: Soft with some RUQ tenderness to palpation. No distention. Positive bowel sounds Musculoskeletal: No lower extremity tenderness nor edema.   Neurologic:  Normal speech and language.  Skin:  Skin is warm, dry and intact.  Psychiatric: Mood and affect are normal.   ____________________________________________   LABS (all labs ordered are listed, but only abnormal results are displayed)  Labs Reviewed  COMPREHENSIVE METABOLIC PANEL - Abnormal; Notable for the following components:      Result Value   Sodium 131 (*)    CO2 15 (*)    Glucose, Bld 102 (*)    Albumin 3.4 (*)    AST 4,084 (*)    ALT 1,260 (*)    Alkaline Phosphatase 129 (*)    Total Bilirubin 5.7 (*)    All other components within normal limits  CBC - Abnormal; Notable for the following components:   Hemoglobin 18.8 (*)    HCT 53.3 (*)    MCV 109.5 (*)    MCH 38.6 (*)    RDW 14.9 (*)    All other components within normal limits  LIPASE, BLOOD  TROPONIN I  ETHANOL  URINALYSIS, COMPLETE (UACMP) WITH MICROSCOPIC  URINE DRUG SCREEN, QUALITATIVE ([facility] ONLY)  PROTIME-INR  APTT  MAGNESIUM  PHOSPHORUS   ____________________________________________  EKG  ED ECG REPORT I, Loney Hering, the attending physician, personally viewed and interpreted this ECG.   Date: 05/25/2018  EKG Time: 0336  Rate: 86  Rhythm: normal sinus rhythm  Axis: normal  Intervals:none  ST&T Change:  none  ____________________________________________  RADIOLOGY  ED MD interpretation:  CXR: Mild cardiomegaly without congestive failure or acute pulmonary process  [place] abd RUQ: Hepatic steatosis, normal sonographic appearance of the gallbladder and biliary tree  Official radiology report(s): Dg Chest Portable 1 View  Result Date: 05/25/2018 CLINICAL DATA:  Chest pain. EXAM: PORTABLE CHEST 1 VIEW COMPARISON:  09/06/2013 FINDINGS: Mild cardiomegaly with normal mediastinal contours. No focal airspace disease, pleural effusion, pulmonary edema or pneumothorax. No  acute osseous abnormalities. IMPRESSION: Mild cardiomegaly without congestive failure or acute pulmonary process. Electronically Signed   By: Jeb Levering M.D.   On: 05/25/2018 04:32   US Abdomen Limited Ruq  Result Date: 05/25/2018 CLINICAL DATA:  Abdominal pain. EXAM: ULTRASOUND ABDOMEN LIMITED RIGHT UPPER QUADRANT COMPARISON:  CT and ultrasound 04/19/2018 FINDINGS: Gallbladder: Physiologically distended. No gallstones or wall thickening visualized. No sonographic Murphy sign noted by sonographer. Common bile duct: Diameter: 3 mm, normal. Liver: No focal lesion identified. Diffusely increased and heterogeneous in parenchymal echogenicity. Portal vein is patent on color Doppler imaging with normal direction of blood flow towards the liver. IMPRESSION: 1. Hepatic steatosis. 2. Normal sonographic appearance of the gallbladder and biliary tree. Electronically Signed   By: Jeb Levering M.D.   On: 05/25/2018 05:24    ____________________________________________   PROCEDURES  Procedure(s) performed: None  Procedures  Critical Care performed: No  ____________________________________________   INITIAL IMPRESSION / ASSESSMENT AND PLAN / ED COURSE  As part of my medical decision making, I reviewed the following data within the electronic MEDICAL RECORD NUMBER Notes from prior ED visits and [place] Controlled Substance Database   This  is a 47 year old male who comes into the hospital today with some right upper quadrant abdominal pain.  My differential diagnosis includes cholecystitis, choledocholithiasis, pancreatitis, gastritis.  We did check some blood work on the patient and his CBC is unremarkable.  The patient is a little bit dehydrated with a hemoglobin of 18.8.  The patient's CMP did return and he has an AST of 4084 and ALT of 1260 and alk phos of 129 and a total bilirubin of 5.9.  The patient's lipase is unremarkable.  I sent the patient for an ultrasound looking for possible choledocholithiasis but his ultrasound just showed fatty liver.  As the patient is an alcoholic I am concerned about alcoholic hepatitis.  He did receive a dose of morphine IV, Zofran IV and a liter of normal saline.  Given the patient's elevated liver enzymes I will admit him to the hospitalist service for further evaluation of his symptoms.  The patient did receive a second dose of morphine 4 mg IV as his pain had not been improved.      ____________________________________________   FINAL CLINICAL IMPRESSION(S) / ED DIAGNOSES  Final diagnoses:  Abdominal pain  Hepatitis  Right upper quadrant abdominal pain     ED Discharge Orders    None       Note:  This document was prepared using Dragon voice recognition software and may include unintentional dictation errors.    Loney Hering, MD 05/25/18 [phone]

## 2018-05-25 NOTE — Consult Note (Addendum)
Cephas Darby, MD [address]  [place]  [place], [place] [address]  Main: [phone]  Fax: [phone] Pager: [phone]   Consultation  Referring Provider:     No ref. provider found Primary Care Physician:  Katheren Shams Primary Gastroenterologist:  Dr. Sherri Sear         Reason for Consultation:     Elevated LFTs, right upper quadrant pain  Date of Admission:  05/25/2018 Date of Consultation:  05/25/2018         HPI:   Ethan Wells is a 47 y.o. Caucasian male with history of heavy alcohol use,tobacco use, polysubstance abuse, coronary Artery disease, ischemic cardiomyopathy, presents with epigastric and right upper quadrant pain and was found to have acute hepatitis, AST greater than ALT, predominantly hepatocellular pattern, AST 4084, AST 1260, T bili 5.7, alkaline phosphatase 126. These were mildly elevated in 04/2018. He was found to be cocaine positive on urine toxicology screen. Patient reports that his last day of alcohol intake was Saturday. He had ultrasound abdomen in the ER which reveals hepatic steatosis, no evidence of cholecystitis. He was resting comfortably in his room when I saw him. He denies nausea, vomiting. He reports experiencing right upper quadrant and epigastric pain for several months. He denies melena, hematemesis, rectal bleeding  NSAIDs: BC powder intermittently  Antiplts/Anticoagulants/Anti thrombotics: none  GI Procedures: none  Past Medical History:  Diagnosis Date  . CAD (coronary artery disease)    a. 01/2011 Anterior STEMI/Cath/PCI: LM nl, LAD 100d (3.5x67m Vision BMS placed), LCX 22mRI 50, RCA min irregs, EF 40% w/ apical, inferoapical HK.  . Cardiac arrest - ventricular fibrillation    a. In setting of STEMI 01/2011  . Cocaine abuse ([facility]  . ETOH abuse    a. 6-12 beers/day  . Hemorrhoids   . Hypertension   . Ischemic cardiomyopathy    a. 06/2011 Echo: EF 45-50%, No rwma  . Marijuana abuse   . Tobacco  abuse    a. 1/2 ppd x 26 yrs    History reviewed. No pertinent surgical history.  Prior to Admission medications   Medication Sig Start Date End Date Taking? Authorizing Provider  omeprazole (PRILOSEC) 20 MG capsule Take 1 capsule (20 mg total) by mouth 2 (two) times daily. 04/19/18 04/19/19 Yes FoHinda KehrMD    Family History  Problem Relation Age of Onset  . Coronary artery disease Mother        alive  . Other Unknown        2 sisters alive and well  . Alcohol abuse Father        died @ 5948   Social History   Tobacco Use  . Smoking status: Current Every Day Smoker    Packs/day: 0.50    Years: 26.00    Pack years: 13.00    Types: Cigarettes  . Smokeless tobacco: Never Used  Substance Use Topics  . Alcohol use: Yes    Comment: at least a 6 pack of beer daily and often a 12 pack  . Drug use: Yes    Types: Cocaine, Marijuana    Comment: denies cocaine; marijuana 2 days ago    Allergies as of 05/25/2018 - Review Complete 05/25/2018  Allergen Reaction Noted  . Penicillins Hives 02/26/2011    Review of Systems:    All systems reviewed and negative except where noted in HPI.   Physical Exam:  Vital signs in last 24 hours:  Temp:  [97.5 F (36.4 C)-97.8 F (36.6 C)] 97.5 F (36.4 C) (06/10 1119) Pulse Rate:  [74-88] 88 (06/10 1119) Resp:  [13-20] 18 (06/10 1119) BP: (97-122)/(65-85) 122/76 (06/10 1119) SpO2:  [93 %-100 %] 99 % (06/10 1119) Weight:  [170 lb (77.1 kg)] 170 lb (77.1 kg) (06/10 0334) Last BM Date: 05/24/18 General:   Pleasant, cooperative in NAD Head:  Normocephalic and atraumatic. Eyes:   deep icterus. Conjunctiva pink. PERRLA. Ears:  Normal auditory acuity. Neck:  Supple; no masses or thyroidomegaly Lungs: Respirations even and unlabored. Lungs clear to auscultation bilaterally.   No wheezes, crackles, or rhonchi.  Heart:  Regular rate and rhythm;  Without murmur, clicks, rubs or gallops Abdomen:  Soft, nondistended, nontender. Normal bowel  sounds. No appreciable masses or hepatomegaly.  No rebound or guarding.  Rectal:  Not performed. Msk:  Symmetrical without gross deformities.  Strength normal  Extremities:  Without edema, cyanosis or clubbing. Neurologic:  Alert and oriented x3;  grossly normal neurologically. Skin:  Intact without significant lesions or rashes. Cervical Nodes:  No significant cervical adenopathy. Psych:  Alert and cooperative. Normal affect.  LAB RESULTS: CBC Latest Ref Rng & Units 05/25/2018 04/19/2018 09/06/2013  WBC 3.8 - 10.6 K/uL 10.5 10.1 7.2  Hemoglobin 13.0 - 18.0 g/dL 18.8(H) 17.9 15.0  Hematocrit 40.0 - 52.0 % 53.3(H) 49.8 42.2  Platelets 150 - 440 K/uL 213 236 270    BMET BMP Latest Ref Rng & Units 05/25/2018 04/19/2018 09/06/2013  Glucose 65 - 99 mg/dL 102(H) 109(H) 167(H)  BUN 6 - 20 mg/dL 7 <5(L) 4(L)  Creatinine 0.61 - 1.24 mg/dL 0.63 0.48(L) 0.78  Sodium 135 - 145 mmol/L 131(L) 131(L) 134(L)  Potassium 3.5 - 5.1 mmol/L 4.4 3.4(L) 3.5  Chloride 101 - 111 mmol/L 102 97(L) 102  CO2 22 - 32 mmol/L 15(L) 22 23  Calcium 8.9 - 10.3 mg/dL 9.0 8.9 8.9    LFT Hepatic Function Latest Ref Rng & Units 05/25/2018 04/19/2018 09/06/2013  Total Protein 6.5 - 8.1 g/dL 7.6 8.0 8.2  Albumin 3.5 - 5.0 g/dL 3.4(L) 3.7 4.0  AST 15 - 41 U/L 4,084(H) 83(H) 24  ALT 17 - 63 U/L 1,260(H) 58 21  Alk Phosphatase 38 - 126 U/L 129(H) 97 108  Total Bilirubin 0.3 - 1.2 mg/dL 5.7(H) 1.0 0.5     STUDIES: Dg Chest Portable 1 View  Result Date: 05/25/2018 CLINICAL DATA:  Chest pain. EXAM: PORTABLE CHEST 1 VIEW COMPARISON:  09/06/2013 FINDINGS: Mild cardiomegaly with normal mediastinal contours. No focal airspace disease, pleural effusion, pulmonary edema or pneumothorax. No acute osseous abnormalities. IMPRESSION: Mild cardiomegaly without congestive failure or acute pulmonary process. Electronically Signed   By: Jeb Levering M.D.   On: 05/25/2018 04:32   US Abdomen Limited Ruq  Result Date: 05/25/2018 CLINICAL  DATA:  Abdominal pain. EXAM: ULTRASOUND ABDOMEN LIMITED RIGHT UPPER QUADRANT COMPARISON:  CT and ultrasound 04/19/2018 FINDINGS: Gallbladder: Physiologically distended. No gallstones or wall thickening visualized. No sonographic Murphy sign noted by sonographer. Common bile duct: Diameter: 3 mm, normal. Liver: No focal lesion identified. Diffusely increased and heterogeneous in parenchymal echogenicity. Portal vein is patent on color Doppler imaging with normal direction of blood flow towards the liver. IMPRESSION: 1. Hepatic steatosis. 2. Normal sonographic appearance of the gallbladder and biliary tree. Electronically Signed   By: Jeb Levering M.D.   On: 05/25/2018 05:24      Impression / Plan:   MAKS CAVALLERO is a 47 y.o. Caucasian male with  coronary Artery disease, status post PCI with stent placement, chronic tobacco use, alcohol use, polysubstance drug abuse with history of chronic right upper quadrant and epigastric pain admitted with worsening of right upper quadrant pain and was found to have acute hepatitis. Differentials include acute alcoholic hepatitis or ischemic liver injury in the setting of colon artery disease, ischemic cardiomyopathy and cocaine use or viral hepatitis  - IV hydration - Monitor LFTs - Check viral hepatitis panel, HSV, CMV, EBV - Protonix 40 mg daily - Discussed with him about abstinence from alcohol use, drug abuse, NSAID use - CIWA protocol - Multivitamin, thiamine and folate daily - Full liquid diet  Thank you for involving me in the care of this patient. Will follow along with you      LOS: 0 days   Sherri Sear, MD  05/25/2018, 6:03 PM   Note: This dictation was prepared with Dragon dictation along with smaller phrase technology. Any transcriptional errors that result from this process are unintentional.

## 2018-05-25 NOTE — ED Notes (Signed)
This RN to bedside at this time to introduce self to patient. Pt resting in bed with lights off, pt is alert and oriented. Pt requesting something to drink and pain medication. This RN apologized for delay, explained shift change and would be calling report on patient ASAP. Pt states understanding. VSS at this time. Pt visualized in NAD.

## 2018-05-25 NOTE — ED Triage Notes (Signed)
Pt arrived via EMS from home with c/o right upper quadrant pain and left flank pain since Thursday; pt says he called 911 tonight because "it's been hurting all day"; denies N/V; denies diarrhea;

## 2018-05-25 NOTE — Progress Notes (Signed)
Labs reviewed Elevated liver enzymes and bilirubin Lipase normal F/U LFT F/U gastroenterology consult IV hydration with normal saline CIWA protocol Abd USD : Gall bladder normal Agree with physical exam and treatment plan by Arta Silence MD

## 2018-05-26 DIAGNOSIS — K759 Inflammatory liver disease, unspecified: Secondary | ICD-10-CM

## 2018-05-26 LAB — COMPREHENSIVE METABOLIC PANEL
ALT: 1169 U/L — ABNORMAL HIGH (ref 17–63)
ALT: 1107 U/L — ABNORMAL HIGH (ref 17–63)
AST: 2078 U/L — ABNORMAL HIGH (ref 15–41)
AST: 1782 U/L — ABNORMAL HIGH (ref 15–41)
Albumin: 2.8 g/dL — ABNORMAL LOW (ref 3.5–5.0)
Albumin: 3.1 g/dL — ABNORMAL LOW (ref 3.5–5.0)
Alkaline Phosphatase: 92 U/L (ref 38–126)
Alkaline Phosphatase: 100 U/L (ref 38–126)
Anion gap: 8 (ref 5–15)
Anion gap: 7 (ref 5–15)
BUN: <5 mg/dL — ABNORMAL LOW (ref 6–20)
BUN: <5 mg/dL — ABNORMAL LOW (ref 6–20)
CO2: 21 mmol/L — ABNORMAL LOW (ref 22–32)
CO2: 19 mmol/L — ABNORMAL LOW (ref 22–32)
Calcium: 7.9 mg/dL — ABNORMAL LOW (ref 8.9–10.3)
Calcium: 8.1 mg/dL — ABNORMAL LOW (ref 8.9–10.3)
Chloride: 105 mmol/L (ref 101–111)
Chloride: 107 mmol/L (ref 101–111)
Creatinine, Ser: 0.74 mg/dL (ref 0.61–1.24)
Creatinine, Ser: 0.66 mg/dL (ref 0.61–1.24)
GFR calc Af Amer: >60 mL/min (ref >60)
GFR calc Af Amer: >60 mL/min (ref >60)
GFR calc non Af Amer: >60 mL/min (ref >60)
GFR calc non Af Amer: >60 mL/min (ref >60)
Glucose, Bld: 116 mg/dL — ABNORMAL HIGH (ref 65–99)
Glucose, Bld: 125 mg/dL — ABNORMAL HIGH (ref 65–99)
Potassium: 3.6 mmol/L (ref 3.5–5.1)
Potassium: 3.5 mmol/L (ref 3.5–5.1)
Sodium: 134 mmol/L — ABNORMAL LOW (ref 135–145)
Sodium: 133 mmol/L — ABNORMAL LOW (ref 135–145)
Total Bilirubin: 3.5 mg/dL — ABNORMAL HIGH (ref 0.3–1.2)
Total Bilirubin: 3.7 mg/dL — ABNORMAL HIGH (ref 0.3–1.2)
Total Protein: 6 g/dL — ABNORMAL LOW (ref 6.5–8.1)
Total Protein: 6.6 g/dL (ref 6.5–8.1)

## 2018-05-26 LAB — CBC
HCT: 40.8 % (ref 40.0–52.0)
Hemoglobin: 14.5 g/dL (ref 13.0–18.0)
MCH: 39.5 pg — ABNORMAL HIGH (ref 26.0–34.0)
MCHC: 35.5 g/dL (ref 32.0–36.0)
MCV: 111.3 fL — ABNORMAL HIGH (ref 80.0–100.0)
Platelets: 143 10*3/uL — ABNORMAL LOW (ref 150–440)
RBC: 3.66 MIL/uL — ABNORMAL LOW (ref 4.40–5.90)
RDW: 14.8 % — ABNORMAL HIGH (ref 11.5–14.5)
WBC: 6.3 10*3/uL (ref 3.8–10.6)

## 2018-05-26 LAB — MAGNESIUM: Magnesium: 2 mg/dL (ref 1.7–2.4)

## 2018-05-26 LAB — FOLATE: Folate: 5 ng/mL — ABNORMAL LOW (ref >5.9)

## 2018-05-26 LAB — VITAMIN B12: Vitamin B-12: 1860 pg/mL — ABNORMAL HIGH (ref 180–914)

## 2018-05-26 LAB — HIV ANTIBODY (ROUTINE TESTING W REFLEX): HIV Screen 4th Generation wRfx: NONREACTIVE

## 2018-05-26 MED ORDER — NICOTINE 21 MG/24HR TD PT24
21.0000 mg | MEDICATED_PATCH | Freq: Every day | TRANSDERMAL | Status: DC
  Administered 2018-05-26 – 2018-05-27 (×2): 21 mg via TRANSDERMAL
  Filled 2018-05-26 (×2): qty 1

## 2018-05-26 NOTE — Progress Notes (Signed)
Cephas Darby, MD [address]  [place]  [place], [place] [address]  Main: [phone]  Fax: [phone] Pager: [phone]   Subjective: Denies any complaints, eating well   Objective: Vital signs in last 24 hours: Vitals:   05/25/18 1119 05/25/18 2031 05/26/18 0414 05/26/18 1318  BP: 122/76 107/70 101/67 110/79  Pulse: 88 82 81 79  Resp: '18 20 20 20  ' Temp: (!) 97.5 F (36.4 C) 98.1 F (36.7 C) 98 F (36.7 C) 98.4 F (36.9 C)  TempSrc:  Oral Oral Oral  SpO2: 99% 99% 98% 99%  Weight:      Height:       Weight change:   Intake/Output Summary (Last 24 hours) at 05/26/2018 1820 Last data filed at 05/26/2018 1732 Gross per 24 hour  Intake 3837 ml  Output 2425 ml  Net 1412 ml     Exam: Heart:: Regular rate and rhythm or S1S2 present Lungs: clear to auscultation Abdomen: soft, nontender, normal bowel sounds   Lab Results: CBC Latest Ref Rng & Units 05/26/2018 05/25/2018 04/19/2018  WBC 3.8 - 10.6 K/uL 6.3 10.5 10.1  Hemoglobin 13.0 - 18.0 g/dL 14.5 18.8(H) 17.9  Hematocrit 40.0 - 52.0 % 40.8 53.3(H) 49.8  Platelets 150 - 440 K/uL 143(L) 213 236   Hepatic Function Latest Ref Rng & Units 05/26/2018 05/26/2018 05/25/2018  Total Protein 6.5 - 8.1 g/dL 6.6 6.0(L) 7.6  Albumin 3.5 - 5.0 g/dL 3.1(L) 2.8(L) 3.4(L)  AST 15 - 41 U/L 1,782(H) 2,078(H) 4,084(H)  ALT 17 - 63 U/L 1,107(H) 1,169(H) 1,260(H)  Alk Phosphatase 38 - 126 U/L 100 92 129(H)  Total Bilirubin 0.3 - 1.2 mg/dL 3.7(H) 3.5(H) 5.7(H)   Micro Results: No results found for this or any previous visit (from the past 240 hour(s)). Studies/Results: Dg Chest Portable 1 View  Result Date: 05/25/2018 CLINICAL DATA:  Chest pain. EXAM: PORTABLE CHEST 1 VIEW COMPARISON:  09/06/2013 FINDINGS: Mild cardiomegaly with normal mediastinal contours. No focal airspace disease, pleural effusion, pulmonary edema or pneumothorax. No acute osseous abnormalities. IMPRESSION: Mild cardiomegaly without congestive failure  or acute pulmonary process. Electronically Signed   By: Jeb Levering M.D.   On: 05/25/2018 04:32   US Abdomen Limited Ruq  Result Date: 05/25/2018 CLINICAL DATA:  Abdominal pain. EXAM: ULTRASOUND ABDOMEN LIMITED RIGHT UPPER QUADRANT COMPARISON:  CT and ultrasound 04/19/2018 FINDINGS: Gallbladder: Physiologically distended. No gallstones or wall thickening visualized. No sonographic Murphy sign noted by sonographer. Common bile duct: Diameter: 3 mm, normal. Liver: No focal lesion identified. Diffusely increased and heterogeneous in parenchymal echogenicity. Portal vein is patent on color Doppler imaging with normal direction of blood flow towards the liver. IMPRESSION: 1. Hepatic steatosis. 2. Normal sonographic appearance of the gallbladder and biliary tree. Electronically Signed   By: Jeb Levering M.D.   On: 05/25/2018 05:24   Medications: I have reviewed the patient's current medications. Scheduled Meds: . feeding supplement (ENSURE ENLIVE)  237 mL Oral BID BM  . folic acid  1 mg Oral Daily  .  morphine injection  4 mg Intravenous Once  . multivitamin with minerals  1 tablet Oral Daily  . nicotine  21 mg Transdermal Daily  . pantoprazole  40 mg Oral Daily  . thiamine  100 mg Oral Daily   Or  . thiamine  100 mg Intravenous Daily   Continuous Infusions: . sodium chloride 125 mL/hr at 05/26/18 1733   PRN Meds:.bisacodyl, LORazepam **OR** LORazepam, morphine injection **OR** morphine injection, ondansetron **OR**  ondansetron (ZOFRAN) IV, senna-docusate   Assessment: Active Problems:   Abdominal pain, RUQ  Ethan Wells is a 47 y.o. Caucasian male with coronary Artery disease, status post PCI with stent placement, chronic tobacco use, alcohol use, polysubstance drug abuse with history of chronic right upper quadrant and epigastric pain admitted with worsening of right upper quadrant pain and was found to have acute hepatitis. Differentials include acute alcoholic hepatitis or  ischemic liver injury in the setting of coronary artery disease, ischemic cardiomyopathy and cocaine use or viral hepatitis LFTs are improving  Plan: Tolerating by mouth well Viral hepatitis panel in process Abstinence from alcohol, drug abuse Protonix 40 mg daily Multivitamin, thiamine and folate daily Anticipate discharge home tomorrow if LFTs continue to downtrend Follow up in GI clinic after discharge   LOS: 1 day   Rohini Vanga 05/26/2018, 6:20 PM

## 2018-05-26 NOTE — Progress Notes (Signed)
Advanced care plan. Purpose of the Encounter: CODE STATUS Parties in Attendance:Patient Patient's Decision Capacity:Good Subjective/Patient's story: Presented for abdominal pain Objective/Medical story Has jaundice and elevated liver enzymes Has acute hepatitis Goals of care determination:  Advance care directives and goals of care discussed with patient in detail Patient wants everything done for now which includes cardiac resuscitation, intubation and ventilator if need arises CODE STATUS: Full code Time spent discussing advanced care planning: 16 minutes

## 2018-05-26 NOTE — Progress Notes (Signed)
[facility] [place] NAME: Ethan Wells    MR#:  [id number]  DATE OF BIRTH:  1971-04-06  SUBJECTIVE:  CHIEF COMPLAINT:   Chief Complaint  Patient presents with  . Abdominal Pain  Patient seen and evaluated today Has decreased abdominal pain No nausea and vomitings No chest pain REVIEW OF SYSTEMS:    ROS  CONSTITUTIONAL: No documented fever. No fatigue, weakness. No weight gain, no weight loss.  EYES: No blurry or double vision.  ENT: No tinnitus. No postnasal drip. No redness of the oropharynx.  RESPIRATORY: No cough, no wheeze, no hemoptysis. No dyspnea.  CARDIOVASCULAR: No chest pain. No orthopnea. No palpitations. No syncope.  GASTROINTESTINAL: No nausea, no vomiting or diarrhea. Has abdominal pain. No melena or hematochezia.  GENITOURINARY: No dysuria or hematuria.  ENDOCRINE: No polyuria or nocturia. No heat or cold intolerance.  HEMATOLOGY: No anemia. No bruising. No bleeding.  INTEGUMENTARY: No rashes. No lesions.  MUSCULOSKELETAL: No arthritis. No swelling. No gout.  NEUROLOGIC: No numbness, tingling, or ataxia. No seizure-type activity.  PSYCHIATRIC: No anxiety. No insomnia. No ADD.   DRUG ALLERGIES:   Allergies  Allergen Reactions  . Penicillins Hives    VITALS:  Blood pressure 101/67, pulse 81, temperature 98 F (36.7 C), temperature source Oral, resp. rate 20, height 5\' 5"  (1.651 m), weight 77.1 kg (170 lb), SpO2 98 %.  PHYSICAL EXAMINATION:   Physical Exam  GENERAL:  47 y.o.-year-old patient lying in the bed with no acute distress.  EYES: Pupils equal, round, reactive to light and accommodation. Has scleral icterus. Extraocular muscles intact.  HEENT: Head atraumatic, normocephalic. Oropharynx and nasopharynx clear.  NECK:  Supple, no jugular venous distention. No thyroid enlargement, no tenderness.  LUNGS: Normal breath sounds bilaterally, no wheezing, rales, rhonchi. No use of accessory muscles of  respiration.  CARDIOVASCULAR: S1, S2 normal. No murmurs, rubs, or gallops.  ABDOMEN: Soft, tenderness in epigastrium, nondistended. Bowel sounds present. No organomegaly or mass.  EXTREMITIES: No cyanosis, clubbing or edema b/l.    NEUROLOGIC: Cranial nerves II through XII are intact. No focal Motor or sensory deficits b/l.   PSYCHIATRIC: The patient is alert and oriented x 3.  SKIN: No obvious rash, lesion, or ulcer.   LABORATORY PANEL:   CBC Recent Labs  Lab 05/26/18 0418  WBC 6.3  HGB 14.5  HCT 40.8  PLT 143*   ------------------------------------------------------------------------------------------------------------------ Chemistries  Recent Labs  Lab 05/26/18 0418  NA 134*  K 3.6  CL 105  CO2 21*  GLUCOSE 116*  BUN <5*  CREATININE 0.74  CALCIUM 7.9*  MG 2.0  AST 2,078*  ALT 1,169*  ALKPHOS 92  BILITOT 3.5*   ------------------------------------------------------------------------------------------------------------------  Cardiac Enzymes Recent Labs  Lab 05/25/18 0337  TROPONINI <0.03   ------------------------------------------------------------------------------------------------------------------  RADIOLOGY:  Dg Chest Portable 1 View  Result Date: 05/25/2018 CLINICAL DATA:  Chest pain. EXAM: PORTABLE CHEST 1 VIEW COMPARISON:  09/06/2013 FINDINGS: Mild cardiomegaly with normal mediastinal contours. No focal airspace disease, pleural effusion, pulmonary edema or pneumothorax. No acute osseous abnormalities. IMPRESSION: Mild cardiomegaly without congestive failure or acute pulmonary process. Electronically Signed   By: Jeb Levering M.D.   On: 05/25/2018 04:32   US Abdomen Limited Ruq  Result Date: 05/25/2018 CLINICAL DATA:  Abdominal pain. EXAM: ULTRASOUND ABDOMEN LIMITED RIGHT UPPER QUADRANT COMPARISON:  CT and ultrasound 04/19/2018 FINDINGS: Gallbladder: Physiologically distended. No gallstones or wall thickening visualized. No sonographic Murphy  sign noted by sonographer. Common bile duct: Diameter:  3 mm, normal. Liver: No focal lesion identified. Diffusely increased and heterogeneous in parenchymal echogenicity. Portal vein is patent on color Doppler imaging with normal direction of blood flow towards the liver. IMPRESSION: 1. Hepatic steatosis. 2. Normal sonographic appearance of the gallbladder and biliary tree. Electronically Signed   By: Jeb Levering M.D.   On: 05/25/2018 05:24     ASSESSMENT AND PLAN:  47 yr old male patient with history of CAD, stent, alcohol, tobacco abuse, substance abuse under service for abdominal pain  - Acute hepatitis IV fluids Appreciate GI evaluation F/U viral hepatitis panel  -Transaminitis from Hepatitis Improving slowly  - Alcohol abuse CIWA protocol Thiamine and folic acid supplementation  - Tobacco abuse Tobacco cessation counseled to patient for six minutes Nicotine patch offered  - Substance Abuse Substance abuse counseling given   All the records are reviewed and case discussed with Care Management/Social Worker. Management plans discussed with the patient, family and they are in agreement.  CODE STATUS:   DVT Prophylaxis: SCDs  TOTAL TIME TAKING CARE OF THIS PATIENT: 36 minutes.   POSSIBLE D/C IN 2 to 3 DAYS, DEPENDING ON CLINICAL CONDITION.  Saundra Shelling M.D on 05/26/2018 at 10:56 AM  Between 7am to 6pm - Pager - [phone]  After 6pm go to [email or web address] - password [email or web address] [facility]  Office  [phone]  CC: Primary care physician; Katheren Shams  Note: This dictation was prepared with Dragon dictation along with smaller phrase technology. Any transcriptional errors that result from this process are unintentional.

## 2018-05-27 LAB — HEPATITIS PANEL, ACUTE
HCV Ab: <0.1 s/co ratio (ref 0.0–0.9)
Hep A IgM: NEGATIVE
Hep B C IgM: NEGATIVE
Hepatitis B Surface Ag: NEGATIVE

## 2018-05-27 LAB — CBC
HCT: 40.9 % (ref 40.0–52.0)
Hemoglobin: 14.2 g/dL (ref 13.0–18.0)
MCH: 38.9 pg — ABNORMAL HIGH (ref 26.0–34.0)
MCHC: 34.8 g/dL (ref 32.0–36.0)
MCV: 111.9 fL — ABNORMAL HIGH (ref 80.0–100.0)
Platelets: 151 10*3/uL (ref 150–440)
RBC: 3.65 MIL/uL — ABNORMAL LOW (ref 4.40–5.90)
RDW: 15.3 % — ABNORMAL HIGH (ref 11.5–14.5)
WBC: 6.7 10*3/uL (ref 3.8–10.6)

## 2018-05-27 LAB — HEPATIC FUNCTION PANEL
ALT: 763 U/L — ABNORMAL HIGH (ref 17–63)
AST: 709 U/L — ABNORMAL HIGH (ref 15–41)
Albumin: 2.7 g/dL — ABNORMAL LOW (ref 3.5–5.0)
Alkaline Phosphatase: 98 U/L (ref 38–126)
Bilirubin, Direct: 1.9 mg/dL — ABNORMAL HIGH (ref 0.1–0.5)
Indirect Bilirubin: 1.3 mg/dL — ABNORMAL HIGH (ref 0.3–0.9)
Total Bilirubin: 3.2 mg/dL — ABNORMAL HIGH (ref 0.3–1.2)
Total Protein: 6 g/dL — ABNORMAL LOW (ref 6.5–8.1)

## 2018-05-27 LAB — CMV IGM: CMV IgM: <30 AU/mL (ref 0.0–29.9)

## 2018-05-27 LAB — HSV 1 AND 2 IGM ABS, INDIRECT
HSV 1 IgM Antibodies: <1:10 {titer}
HSV 2 IgM Antibodies: <1:10 {titer}

## 2018-05-27 LAB — EPSTEIN-BARR VIRUS VCA, IGM: EBV VCA IgM: <36 U/mL (ref 0.0–35.9)

## 2018-05-27 MED ORDER — ENSURE ENLIVE PO LIQD: 237.0000 mL | Freq: Two times a day (BID) | ORAL | 0 refills | Status: DC

## 2018-05-27 MED ORDER — ADULT MULTIVITAMIN W/MINERALS CH: 1.0000 | ORAL_TABLET | Freq: Every day | ORAL | 0 refills | Status: DC

## 2018-05-27 NOTE — Progress Notes (Signed)
Discharge instructions reviewed with patient.  Understanding was verbalized and all questions were answered.  Prescriptions provided and followup appointments reviewed.  Patient discharged home ambulatory in stable condition escorted by nursing staff.

## 2018-05-27 NOTE — Discharge Summary (Signed)
Ethan Wells at [place] NAME: Ethan Wells    MR#:  [id number]  DATE OF BIRTH:  [birth year]  DATE OF ADMISSION:  05/25/2018 ADMITTING PHYSICIAN: Arta Silence, MD  DATE OF DISCHARGE: No discharge date for patient encounter.  PRIMARY CARE PHYSICIAN: [facility]    ADMISSION DIAGNOSIS:  Hepatitis [K75.9] Right upper quadrant abdominal pain [R10.11] Abdominal pain [R10.9]  DISCHARGE DIAGNOSIS:  Active Problems:   Abdominal pain, RUQ   SECONDARY DIAGNOSIS:   Past Medical History:  Diagnosis Date  . CAD (coronary artery disease)    a. 01/2011 Anterior STEMI/Cath/PCI: LM nl, LAD 100d (3.5x25mm Vision BMS placed), LCX 46m, RI 50, RCA min irregs, EF 40% w/ apical, inferoapical HK.  . Cardiac arrest - ventricular fibrillation    a. In setting of STEMI 01/2011  . Cocaine abuse ([place])   . ETOH abuse    a. 6-12 beers/day  . Hemorrhoids   . Hypertension   . Ischemic cardiomyopathy    a. 06/2011 Echo: EF 45-50%, No rwma  . Marijuana abuse   . Tobacco abuse    a. 1/2 ppd x 26 yrs    HOSPITAL COURSE:  47 yr old male patient with history of CAD, stent, alcohol, tobacco abuse, substance abuse under service for abdominal pain  - Acute hepatitis Resolving Etiology unknown -suspect due to alcohol abuse in setting of poly-substance abuse (cocaine/marijuana/opioids) Treated with IVFs, hepatitis panel negative, abdominal ultrasound noted for hepatic steatosis, GI did see pt while in house -recommended outpatient follow-up status post discharge  -Transaminitis from Hepatitis Improving slowly  - Alcohol abuse Stable Placed on CIWA protocol while in house  - Tobacco abuse Stable Tobacco cessation counseling while in house and provided Nicotine patch   - Substance Abuse Substance abuse counseling given while in house Urine drug screen noted for cocaine/marijuana/opioids  DISCHARGE CONDITIONS:   Guarded given  polysubtance abuse  CONSULTS OBTAINED:  Treatment Team:  Arta Silence, MD Lin Landsman, MD  DRUG ALLERGIES:   Allergies  Allergen Reactions  . Penicillins Hives    DISCHARGE MEDICATIONS:   Allergies as of 05/27/2018      Reactions   Penicillins Hives      Medication List    TAKE these medications   feeding supplement (ENSURE ENLIVE) Liqd Take 237 mLs by mouth 2 (two) times daily between meals.   multivitamin with minerals Tabs tablet Take 1 tablet by mouth daily. Start taking on:  05/28/2018   omeprazole 20 MG capsule Commonly known as:  PRILOSEC Take 1 capsule (20 mg total) by mouth 2 (two) times daily.        DISCHARGE INSTRUCTIONS:    If you experience worsening of your admission symptoms, develop shortness of breath, life threatening emergency, suicidal or homicidal thoughts you must seek medical attention immediately by calling 911 or calling your MD immediately  if symptoms less severe.  You Must read complete instructions/literature along with all the possible adverse reactions/side effects for all the Medicines you take and that have been prescribed to you. Take any new Medicines after you have completely understood and accept all the possible adverse reactions/side effects.   Please note  You were cared for by a hospitalist during your hospital stay. If you have any questions about your discharge medications or the care you received while you were in the hospital after you are discharged, you can call the unit and asked to speak with the hospitalist on call if the  hospitalist that took care of you is not available. Once you are discharged, your primary care physician will handle any further medical issues. Please note that NO REFILLS for any discharge medications will be authorized once you are discharged, as it is imperative that you return to your primary care physician (or establish a relationship with a primary care physician if you do not have  one) for your aftercare needs so that they can reassess your need for medications and monitor your lab values.    Today   CHIEF COMPLAINT:   Chief Complaint  Patient presents with  . Abdominal Pain    HISTORY OF PRESENT ILLNESS:  47 y.o. male with a known history of EtOH abuse/dependence p/w RUQ AP. Endorses sharp RUQ AP x2d. Endorses nausea. Only had one episode of vomiting, characterized as "dark brown". Denies hematemesis, melena/hematochezia. States he drinks 6-12 beers per day. Has not drank in 3d. Denies withdrawal symptoms. Endorses headache. Is otherwise w/o complaint. Denies jaundice or leg edema. Does not appear icteric or tremulous/diaphoretic on exam. His vitals are stable.  VITAL SIGNS:  Blood pressure 116/84, pulse 79, temperature 98.1 F (36.7 C), temperature source Oral, resp. rate 18, height 5\' 5"  (1.651 m), weight 77.1 kg (170 lb), SpO2 96 %.  I/O:    Intake/Output Summary (Last 24 hours) at 05/27/2018 1017 Last data filed at 05/27/2018 0800 Gross per 24 hour  Intake 1232 ml  Output 850 ml  Net 382 ml    PHYSICAL EXAMINATION:  GENERAL:  47 y.o.-year-old patient lying in the bed with no acute distress.  EYES: Pupils equal, round, reactive to light and accommodation. No scleral icterus. Extraocular muscles intact.  HEENT: Head atraumatic, normocephalic. Oropharynx and nasopharynx clear.  NECK:  Supple, no jugular venous distention. No thyroid enlargement, no tenderness.  LUNGS: Normal breath sounds bilaterally, no wheezing, rales,rhonchi or crepitation. No use of accessory muscles of respiration.  CARDIOVASCULAR: S1, S2 normal. No murmurs, rubs, or gallops.  ABDOMEN: Soft, non-tender, non-distended. Bowel sounds present. No organomegaly or mass.  EXTREMITIES: No pedal edema, cyanosis, or clubbing.  NEUROLOGIC: Cranial nerves II through XII are intact. Muscle strength 5/5 in all extremities. Sensation intact. Gait not checked.  PSYCHIATRIC: The patient is alert  and oriented x 3.  SKIN: No obvious rash, lesion, or ulcer.   DATA REVIEW:   CBC Recent Labs  Lab 05/27/18 0318  WBC 6.7  HGB 14.2  HCT 40.9  PLT 151    Chemistries  Recent Labs  Lab 05/26/18 0418 05/26/18 1101  NA 134* 133*  K 3.6 3.5  CL 105 107  CO2 21* 19*  GLUCOSE 116* 125*  BUN <5* <5*  CREATININE 0.74 0.66  CALCIUM 7.9* 8.1*  MG 2.0  --   AST 2,078* 1,782*  ALT 1,169* 1,107*  ALKPHOS 92 100  BILITOT 3.5* 3.7*    Cardiac Enzymes Recent Labs  Lab 05/25/18 0337  TROPONINI <0.03    Microbiology Results  Results for orders placed or performed during the hospital encounter of 11/16/12  MRSA PCR Screening     Status: None   Collection Time: 11/16/12  6:25 PM  Result Value Ref Range Status   MRSA by PCR NEGATIVE NEGATIVE Final    Comment:        The GeneXpert MRSA Assay (FDA approved for NASAL specimens only), is one component of a comprehensive MRSA colonization surveillance program. It is not intended to diagnose MRSA infection nor to guide or monitor treatment for MRSA infections.  RADIOLOGY:  No results found.  EKG:   Orders placed or performed during the hospital encounter of 05/25/18  . EKG 12-Lead  . EKG 12-Lead  . ED EKG  . ED EKG  . EKG 12-Lead  . EKG 12-Lead      Management plans discussed with the patient, family and they are in agreement.  CODE STATUS:     Code Status Orders  (From admission, onward)        Start     Ordered   05/25/18 0820  Full code  Continuous     05/25/18 0819    Code Status History    This patient has a current code status but no historical code status.      TOTAL TIME TAKING CARE OF THIS PATIENT: 45 minutes.    Avel Peace Olander Friedl M.D on 05/27/2018 at 10:17 AM  Between 7am to 6pm - Pager - [phone]  After 6pm go to [email or web address] - password [email or web address] [facility]  Office  [phone]  CC: Primary care physician; Katheren Shams   Note: This dictation  was prepared with Dragon dictation along with smaller phrase technology. Any transcriptional errors that result from this process are unintentional.

## 2018-05-28 LAB — VITAMIN B1: Vitamin B1 (Thiamine): 133 nmol/L (ref 66.5–200.0)

## 2018-07-10 ENCOUNTER — Ambulatory Visit: Payer: Medicare HMO | Admitting: Gastroenterology

## 2018-07-26 ENCOUNTER — Emergency Department: Payer: Medicare HMO

## 2018-07-26 ENCOUNTER — Encounter: Payer: Self-pay | Admitting: Emergency Medicine

## 2018-07-26 ENCOUNTER — Other Ambulatory Visit: Payer: Self-pay

## 2018-07-26 ENCOUNTER — Emergency Department
Admission: EM | Admit: 2018-07-26 | Discharge: 2018-07-26 | Disposition: A | Discharge status: Home / Self Care | Payer: Medicare HMO | Attending: Emergency Medicine | Admitting: Emergency Medicine

## 2018-07-26 DIAGNOSIS — I2510 Atherosclerotic heart disease of native coronary artery without angina pectoris: Secondary | ICD-10-CM | POA: Insufficient documentation

## 2018-07-26 DIAGNOSIS — F1410 Cocaine abuse, uncomplicated: Secondary | ICD-10-CM | POA: Insufficient documentation

## 2018-07-26 DIAGNOSIS — S63612A Unspecified sprain of right middle finger, initial encounter: Secondary | ICD-10-CM | POA: Insufficient documentation

## 2018-07-26 DIAGNOSIS — Y929 Unspecified place or not applicable: Secondary | ICD-10-CM | POA: Diagnosis not present

## 2018-07-26 DIAGNOSIS — Y9389 Activity, other specified: Secondary | ICD-10-CM | POA: Insufficient documentation

## 2018-07-26 DIAGNOSIS — F1010 Alcohol abuse, uncomplicated: Secondary | ICD-10-CM | POA: Insufficient documentation

## 2018-07-26 DIAGNOSIS — X58XXXA Exposure to other specified factors, initial encounter: Secondary | ICD-10-CM | POA: Diagnosis not present

## 2018-07-26 DIAGNOSIS — F17210 Nicotine dependence, cigarettes, uncomplicated: Secondary | ICD-10-CM | POA: Insufficient documentation

## 2018-07-26 DIAGNOSIS — Y998 Other external cause status: Secondary | ICD-10-CM | POA: Insufficient documentation

## 2018-07-26 DIAGNOSIS — S6991XA Unspecified injury of right wrist, hand and finger(s), initial encounter: Secondary | ICD-10-CM | POA: Diagnosis not present

## 2018-07-26 DIAGNOSIS — F1210 Cannabis abuse, uncomplicated: Secondary | ICD-10-CM | POA: Diagnosis not present

## 2018-07-26 DIAGNOSIS — Z79899 Other long term (current) drug therapy: Secondary | ICD-10-CM | POA: Diagnosis not present

## 2018-07-26 MED ORDER — TRAMADOL HCL 50 MG PO TABS: 50.0000 mg | ORAL_TABLET | Freq: Four times a day (QID) | ORAL | 0 refills | Status: DC | PRN

## 2018-07-26 MED ORDER — HYDROCODONE-ACETAMINOPHEN 5-325 MG PO TABS: 1.0000 | ORAL_TABLET | Freq: Four times a day (QID) | ORAL | 0 refills | Status: DC | PRN
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
## 2018-07-26 NOTE — ED Notes (Signed)
See triage note  Presents with pain and swelling to right hand  States he was messing around last pm   Thinks that he bent his middle finger back  Swelling noted to back of hand

## 2018-07-26 NOTE — ED Provider Notes (Signed)
[facility] Emergency Department Provider Note  ____________________________________________  Time seen: Approximately 3:06 PM  I have reviewed the triage vital signs and the nursing notes.   HISTORY  Chief Complaint Hand Pain (right)    HPI Ethan Wells is a 47 y.o. male that presents emergency department for evaluation of right hand pain since last night.  Patient states that he had not seen a friend in a long time and gave him a hard handshake.  When the friend was leaving they were trying to give a another finger bent backwards.  No specific trauma. He has not taken anything for pain.   Past Medical History:  Diagnosis Date  . CAD (coronary artery disease)    a. 01/2011 Anterior STEMI/Cath/PCI: LM nl, LAD 100d (3.5x32mm Vision BMS placed), LCX 4m, RI 50, RCA min irregs, EF 40% w/ apical, inferoapical HK.  . Cardiac arrest - ventricular fibrillation    a. In setting of STEMI 01/2011  . Cocaine abuse ([place])   . ETOH abuse    a. 6-12 beers/day  . Hemorrhoids   . Hypertension   . Ischemic cardiomyopathy    a. 06/2011 Echo: EF 45-50%, No rwma  . Marijuana abuse   . Tobacco abuse    a. 1/2 ppd x 26 yrs    Patient Active Problem List   Diagnosis Date Noted  . Abdominal pain, RUQ 05/25/2018  . Chest pain, mid sternal 11/17/2012  . Cocaine abuse ([place])   . Marijuana abuse   . TOBACCO ABUSE 02/26/2011  . HYPERTENSION, BENIGN 02/26/2011  . CAD, NATIVE VESSEL 02/26/2011  . Other specified forms of chronic ischemic heart disease 02/26/2011    History reviewed. No pertinent surgical history.  Prior to Admission medications   Medication Sig Start Date End Date Taking? Authorizing Provider  feeding supplement, ENSURE ENLIVE, (ENSURE ENLIVE) LIQD Take 237 mLs by mouth 2 (two) times daily between meals. 05/27/18   Salary, Avel Peace, MD  Multiple Vitamin (MULTIVITAMIN WITH MINERALS) TABS tablet Take 1 tablet by mouth daily. 05/28/18   Salary, Avel Peace,  MD  omeprazole (PRILOSEC) 20 MG capsule Take 1 capsule (20 mg total) by mouth 2 (two) times daily. 04/19/18 04/19/19  Hinda Kehr, MD  traMADol (ULTRAM) 50 MG tablet Take 1 tablet (50 mg total) by mouth every 6 (six) hours as needed. 07/26/18 07/26/19  Laban Emperor, PA-C    Allergies Penicillins  Family History  Problem Relation Age of Onset  . Coronary artery disease Mother        alive  . Other Unknown        2 sisters alive and well  . Alcohol abuse Father        died @ 55    Social History Social History   Tobacco Use  . Smoking status: Current Every Day Smoker    Packs/day: 0.50    Years: 26.00    Pack years: 13.00    Types: Cigarettes  . Smokeless tobacco: Never Used  Substance Use Topics  . Alcohol use: Yes    Comment: at least a 6 pack of beer daily and often a 12 pack  . Drug use: Yes    Types: Cocaine, Marijuana    Comment: denies cocaine; marijuana 2 days ago     Review of Systems  Constitutional: No fever/chills Gastrointestinal: No nausea, no vomiting.  Musculoskeletal: Positive for knuckle pain.  Skin: Negative for rash, abrasions, lacerations, ecchymosis. Neurological: Negative for  numbness or tingling  ____________________________________________   PHYSICAL EXAM:  VITAL SIGNS: ED Triage Vitals [07/26/18 1337]  Enc Vitals Group     BP (!) 166/95     Pulse Rate 99     Resp 18     Temp 98.5 F (36.9 C)     Temp Source Oral     SpO2 98 %     Weight 200 lb (90.7 kg)     Height 5\' 5"  (1.651 m)     Head Circumference      Peak Flow      Pain Score 8     Pain Loc      Pain Edu?      Excl. in [place]?      Constitutional: Alert and oriented. Well appearing and in no acute distress. Eyes: Conjunctivae are normal. PERRL. EOMI. Head: Atraumatic. ENT:      Ears:      Nose: No congestion/rhinnorhea.      Mouth/Throat: Mucous membranes are moist.  Neck: No stridor.   Cardiovascular: Normal rate, regular rhythm.  Good peripheral  circulation. Respiratory: Normal respiratory effort without tachypnea or retractions. Lungs CTAB. Good air entry to the bases with no decreased or absent breath sounds. Musculoskeletal: Full range of motion to all extremities. No gross deformities appreciated.  Full range of motion of hand. Neurologic:  Normal speech and language. No gross focal neurologic deficits are appreciated.  Skin:  Skin is warm, dry.  Swelling noted to right middle knuckles.  No ecchymosis. Psychiatric: Mood and affect are normal. Speech and behavior are normal. Patient exhibits appropriate insight and judgement.   ____________________________________________   LABS (all labs ordered are listed, but only abnormal results are displayed)  Labs Reviewed - No data to display ____________________________________________  EKG   ____________________________________________  RADIOLOGY Robinette Haines, personally viewed and evaluated these images (plain radiographs) as part of my medical decision making, as well as reviewing the written report by the radiologist.  No results found.  ____________________________________________    PROCEDURES  Procedure(s) performed:    Procedures    Medications - No data to display   ____________________________________________   INITIAL IMPRESSION / ASSESSMENT AND PLAN / ED COURSE  Pertinent labs & imaging results that were available during my care of the patient were reviewed by me and considered in my medical decision making (see chart for details).  Review of the [place] CSRS was performed in accordance of the [place] prior to dispensing any controlled drugs.   Patient's diagnosis is consistent with finger sprain.  Vital signs and exam are reassuring.  X-ray negative for acute bony abnormalities.  Patient will be discharged home with prescriptions for a short course of tramadol. Patient is to follow up with PCP as directed. Patient is given ED precautions to return to  the ED for any worsening or new symptoms.     ____________________________________________  FINAL CLINICAL IMPRESSION(S) / ED DIAGNOSES  Final diagnoses:  Sprain of right middle finger, unspecified site of finger, initial encounter      NEW MEDICATIONS STARTED DURING THIS VISIT:      This chart was dictated using voice recognition software/Dragon. Despite best efforts to proofread, errors can occur which can change the meaning. Any change was purely unintentional.    Laban Emperor, PA-C 07/27/18 1439    Nena Polio, MD 07/28/18 1537

## 2019-02-26 ENCOUNTER — Other Ambulatory Visit: Payer: Self-pay

## 2019-02-26 ENCOUNTER — Emergency Department
Admission: EM | Admit: 2019-02-26 | Discharge: 2019-02-26 | Discharge status: Against Medical Advice | Payer: Medicare HMO | Attending: Emergency Medicine | Admitting: Emergency Medicine

## 2019-02-26 DIAGNOSIS — R109 Unspecified abdominal pain: Secondary | ICD-10-CM | POA: Diagnosis not present

## 2019-02-26 DIAGNOSIS — Z5321 Procedure and treatment not carried out due to patient leaving prior to being seen by health care provider: Secondary | ICD-10-CM | POA: Diagnosis not present

## 2019-02-26 LAB — URINALYSIS, COMPLETE (UACMP) WITH MICROSCOPIC
Bacteria, UA: NONE SEEN
Bilirubin Urine: NEGATIVE
Glucose, UA: NEGATIVE mg/dL
Hgb urine dipstick: NEGATIVE
Ketones, ur: NEGATIVE mg/dL
Leukocytes,Ua: NEGATIVE
Nitrite: NEGATIVE
Protein, ur: NEGATIVE mg/dL
Specific Gravity, Urine: 1.017 (ref 1.005–1.030)
pH: 7 (ref 5.0–8.0)

## 2019-02-26 LAB — COMPREHENSIVE METABOLIC PANEL
ALT: 29 U/L (ref 0–44)
AST: 46 U/L — ABNORMAL HIGH (ref 15–41)
Albumin: 3.6 g/dL (ref 3.5–5.0)
Alkaline Phosphatase: 106 U/L (ref 38–126)
Anion gap: 12 (ref 5–15)
BUN: 6 mg/dL (ref 6–20)
CO2: 20 mmol/L — ABNORMAL LOW (ref 22–32)
Calcium: 8.5 mg/dL — ABNORMAL LOW (ref 8.9–10.3)
Chloride: 100 mmol/L (ref 98–111)
Creatinine, Ser: 0.68 mg/dL (ref 0.61–1.24)
GFR calc Af Amer: >60 mL/min (ref >60)
GFR calc non Af Amer: >60 mL/min (ref >60)
Glucose, Bld: 118 mg/dL — ABNORMAL HIGH (ref 70–99)
Potassium: 4 mmol/L (ref 3.5–5.1)
Sodium: 132 mmol/L — ABNORMAL LOW (ref 135–145)
Total Bilirubin: 1.3 mg/dL — ABNORMAL HIGH (ref 0.3–1.2)
Total Protein: 7.7 g/dL (ref 6.5–8.1)

## 2019-02-26 LAB — CBC
HCT: 47.8 % (ref 39.0–52.0)
Hemoglobin: 16.8 g/dL (ref 13.0–17.0)
MCH: 38.1 pg — ABNORMAL HIGH (ref 26.0–34.0)
MCHC: 35.1 g/dL (ref 30.0–36.0)
MCV: 108.4 fL — ABNORMAL HIGH (ref 80.0–100.0)
Platelets: 260 10*3/uL (ref 150–400)
RBC: 4.41 MIL/uL (ref 4.22–5.81)
RDW: 14 % (ref 11.5–15.5)
WBC: 10.7 10*3/uL — ABNORMAL HIGH (ref 4.0–10.5)
nRBC: 0 % (ref 0.0–0.2)

## 2019-02-26 LAB — LIPASE, BLOOD: Lipase: 21 U/L (ref 11–51)

## 2019-02-26 NOTE — ED Triage Notes (Signed)
Pt c/o cough, fever, low abd pain, vomiting, and diarrhea. States symptoms since Monday. Denies being around anyone with flu. Been taking "flu and cough syrup and goodie powder." A&O, ambulatory.

## 2019-03-01 ENCOUNTER — Telehealth: Payer: Self-pay | Admitting: Emergency Medicine

## 2019-03-01 NOTE — Telephone Encounter (Signed)
Called patient due to lwot to inquire about condition and follow up plans. Says a little better.  No plan to call his doctor.  Advised to call his doctor and that lab results are available.  Also told him to return if needed.

## 2019-06-16 DIAGNOSIS — R0602 Shortness of breath: Secondary | ICD-10-CM | POA: Insufficient documentation

## 2019-06-16 DIAGNOSIS — G8929 Other chronic pain: Secondary | ICD-10-CM | POA: Diagnosis not present

## 2019-06-16 DIAGNOSIS — F1010 Alcohol abuse, uncomplicated: Secondary | ICD-10-CM | POA: Diagnosis not present

## 2019-06-16 DIAGNOSIS — F17210 Nicotine dependence, cigarettes, uncomplicated: Secondary | ICD-10-CM | POA: Insufficient documentation

## 2019-06-16 DIAGNOSIS — I10 Essential (primary) hypertension: Secondary | ICD-10-CM | POA: Diagnosis not present

## 2019-06-16 DIAGNOSIS — F1410 Cocaine abuse, uncomplicated: Secondary | ICD-10-CM | POA: Insufficient documentation

## 2019-06-16 DIAGNOSIS — I2510 Atherosclerotic heart disease of native coronary artery without angina pectoris: Secondary | ICD-10-CM | POA: Insufficient documentation

## 2019-06-16 DIAGNOSIS — M25512 Pain in left shoulder: Secondary | ICD-10-CM | POA: Diagnosis not present

## 2019-06-16 DIAGNOSIS — R1084 Generalized abdominal pain: Secondary | ICD-10-CM | POA: Diagnosis not present

## 2019-06-16 DIAGNOSIS — M79602 Pain in left arm: Secondary | ICD-10-CM | POA: Diagnosis not present

## 2019-06-16 DIAGNOSIS — R000 Tachycardia, unspecified: Secondary | ICD-10-CM | POA: Diagnosis not present

## 2019-06-17 ENCOUNTER — Other Ambulatory Visit: Payer: Self-pay

## 2019-06-17 ENCOUNTER — Emergency Department
Admission: EM | Admit: 2019-06-17 | Discharge: 2019-06-17 | Disposition: A | Discharge status: Home / Self Care | Payer: Medicare HMO | Attending: Emergency Medicine | Admitting: Emergency Medicine

## 2019-06-17 ENCOUNTER — Emergency Department: Payer: Medicare HMO

## 2019-06-17 ENCOUNTER — Encounter: Payer: Self-pay | Admitting: Emergency Medicine

## 2019-06-17 DIAGNOSIS — R1084 Generalized abdominal pain: Secondary | ICD-10-CM | POA: Diagnosis not present

## 2019-06-17 DIAGNOSIS — R109 Unspecified abdominal pain: Secondary | ICD-10-CM

## 2019-06-17 DIAGNOSIS — M25512 Pain in left shoulder: Secondary | ICD-10-CM | POA: Diagnosis not present

## 2019-06-17 DIAGNOSIS — F1010 Alcohol abuse, uncomplicated: Secondary | ICD-10-CM

## 2019-06-17 DIAGNOSIS — F1410 Cocaine abuse, uncomplicated: Secondary | ICD-10-CM

## 2019-06-17 DIAGNOSIS — R0602 Shortness of breath: Secondary | ICD-10-CM | POA: Diagnosis not present

## 2019-06-17 DIAGNOSIS — G8929 Other chronic pain: Secondary | ICD-10-CM

## 2019-06-17 DIAGNOSIS — M79602 Pain in left arm: Secondary | ICD-10-CM

## 2019-06-17 LAB — CBC
HCT: 42 % (ref 39.0–52.0)
Hemoglobin: 14.5 g/dL (ref 13.0–17.0)
MCH: 38.8 pg — ABNORMAL HIGH (ref 26.0–34.0)
MCHC: 34.5 g/dL (ref 30.0–36.0)
MCV: 112.3 fL — ABNORMAL HIGH (ref 80.0–100.0)
Platelets: 186 10*3/uL (ref 150–400)
RBC: 3.74 MIL/uL — ABNORMAL LOW (ref 4.22–5.81)
RDW: 13.5 % (ref 11.5–15.5)
WBC: 7.1 10*3/uL (ref 4.0–10.5)
nRBC: 0 % (ref 0.0–0.2)

## 2019-06-17 LAB — URINE DRUG SCREEN, QUALITATIVE (ARMC ONLY)
Amphetamines, Ur Screen: POSITIVE — AB
Barbiturates, Ur Screen: NOT DETECTED
Benzodiazepine, Ur Scrn: NOT DETECTED
Cannabinoid 50 Ng, Ur ~~LOC~~: POSITIVE — AB
Cocaine Metabolite,Ur ~~LOC~~: POSITIVE — AB
MDMA (Ecstasy)Ur Screen: NOT DETECTED
Methadone Scn, Ur: NOT DETECTED
Opiate, Ur Screen: NOT DETECTED
Phencyclidine (PCP) Ur S: NOT DETECTED
Tricyclic, Ur Screen: NOT DETECTED

## 2019-06-17 LAB — URINALYSIS, COMPLETE (UACMP) WITH MICROSCOPIC
Bacteria, UA: NONE SEEN
Bilirubin Urine: NEGATIVE
Glucose, UA: NEGATIVE mg/dL
Hgb urine dipstick: NEGATIVE
Ketones, ur: NEGATIVE mg/dL
Leukocytes,Ua: NEGATIVE
Nitrite: NEGATIVE
Protein, ur: NEGATIVE mg/dL
Specific Gravity, Urine: 1.014 (ref 1.005–1.030)
pH: 6 (ref 5.0–8.0)

## 2019-06-17 LAB — COMPREHENSIVE METABOLIC PANEL
ALT: 46 U/L — ABNORMAL HIGH (ref 0–44)
AST: 64 U/L — ABNORMAL HIGH (ref 15–41)
Albumin: 3.4 g/dL — ABNORMAL LOW (ref 3.5–5.0)
Alkaline Phosphatase: 94 U/L (ref 38–126)
Anion gap: 11 (ref 5–15)
BUN: <5 mg/dL — ABNORMAL LOW (ref 6–20)
CO2: 20 mmol/L — ABNORMAL LOW (ref 22–32)
Calcium: 8.2 mg/dL — ABNORMAL LOW (ref 8.9–10.3)
Chloride: 107 mmol/L (ref 98–111)
Creatinine, Ser: 0.56 mg/dL — ABNORMAL LOW (ref 0.61–1.24)
GFR calc Af Amer: >60 mL/min (ref >60)
GFR calc non Af Amer: >60 mL/min (ref >60)
Glucose, Bld: 172 mg/dL — ABNORMAL HIGH (ref 70–99)
Potassium: 3.2 mmol/L — ABNORMAL LOW (ref 3.5–5.1)
Sodium: 138 mmol/L (ref 135–145)
Total Bilirubin: 0.7 mg/dL (ref 0.3–1.2)
Total Protein: 7.1 g/dL (ref 6.5–8.1)

## 2019-06-17 LAB — TROPONIN I (HIGH SENSITIVITY)
Troponin I (High Sensitivity): 7 ng/L (ref <18)
Troponin I (High Sensitivity): 8 ng/L (ref <18)

## 2019-06-17 LAB — LIPASE, BLOOD: Lipase: 38 U/L (ref 11–51)

## 2019-06-17 IMAGING — CR CHEST - 2 VIEW
1 series · 2 of 2 positions shown · non-contrast
Comparison: [DATE]

CLINICAL DATA: Shortness of breath

EXAM:
CHEST - 2 VIEW

[Series 1: dg chest 2 view · 0.14mm/px · 2 of 2 slices shown]
[im 1/2]
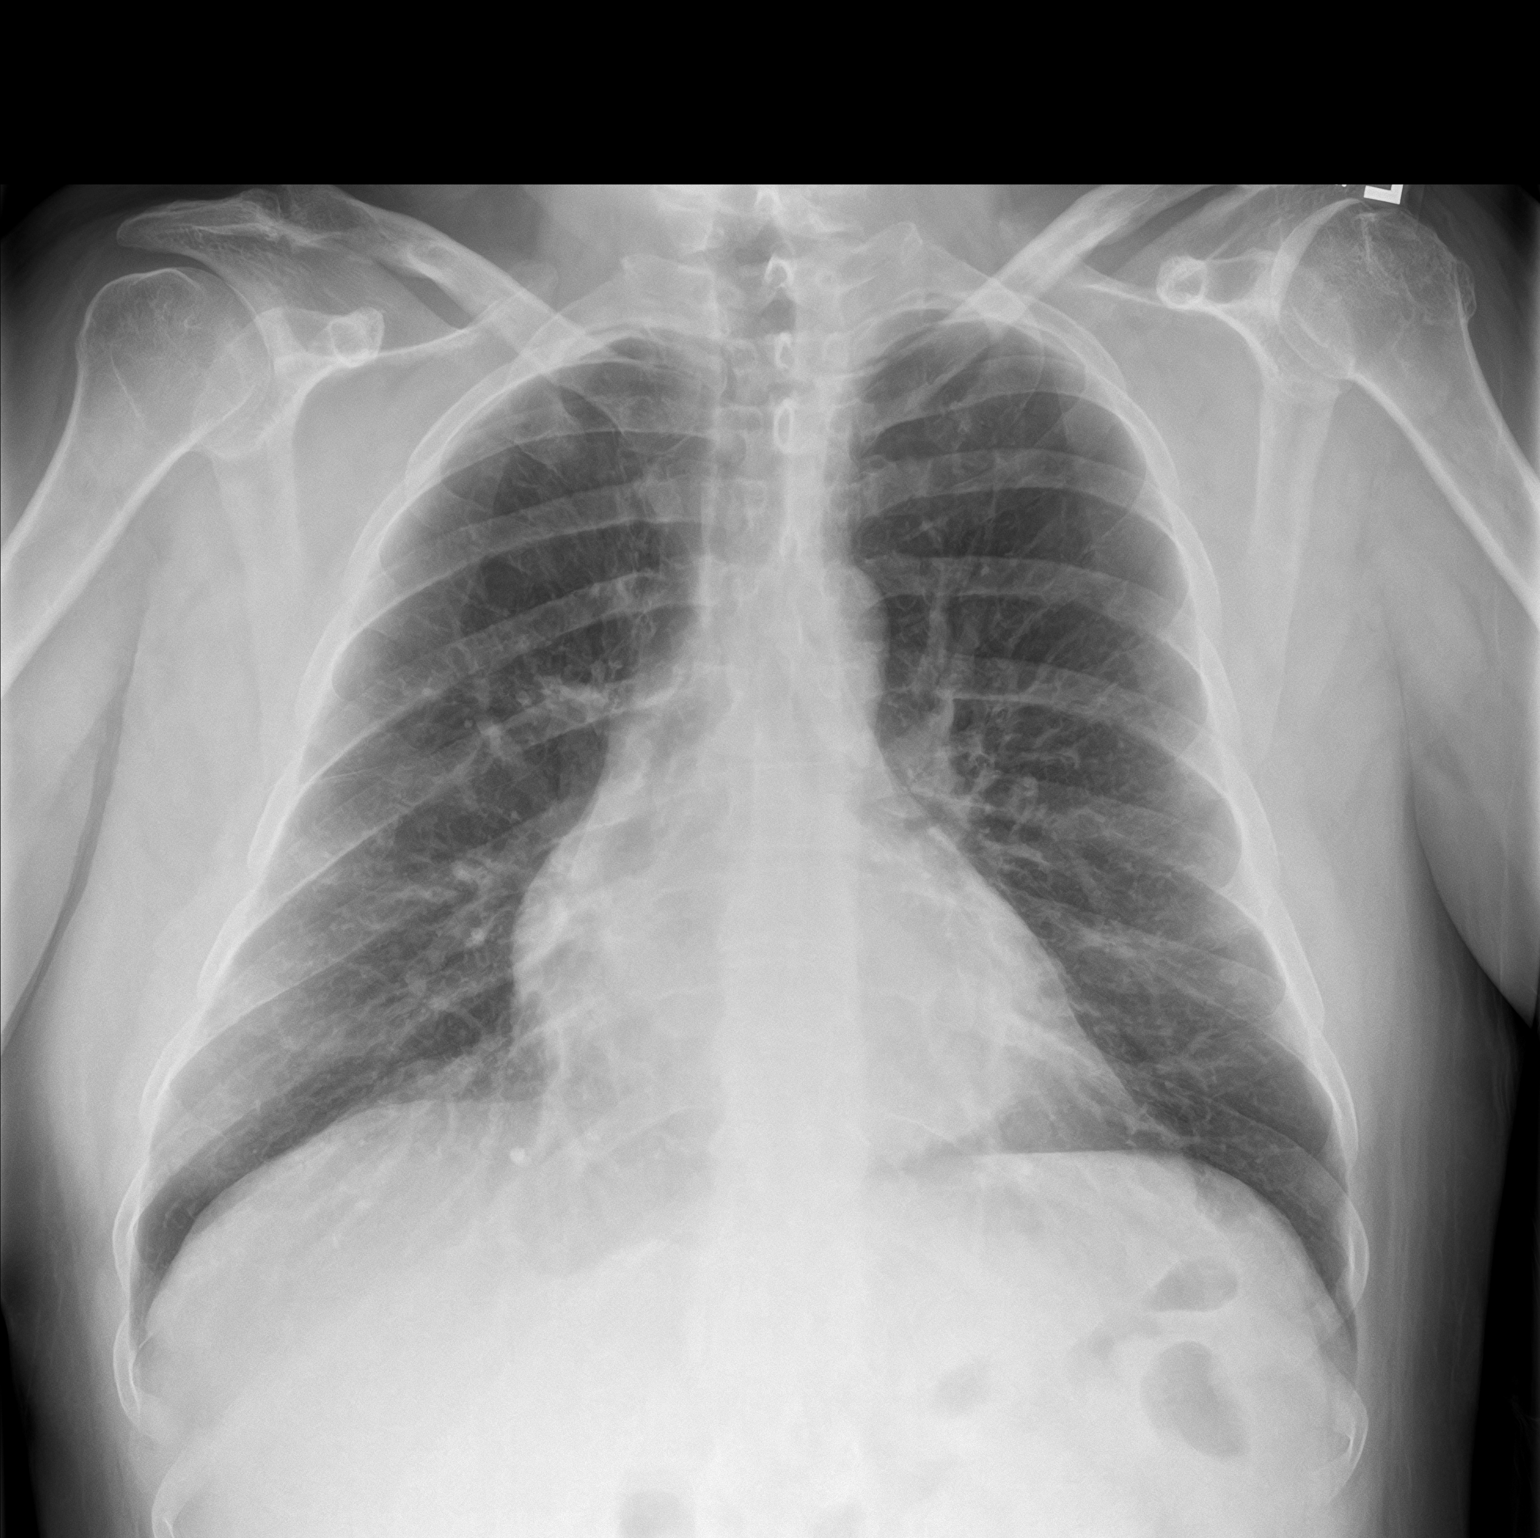
[im 2/2]
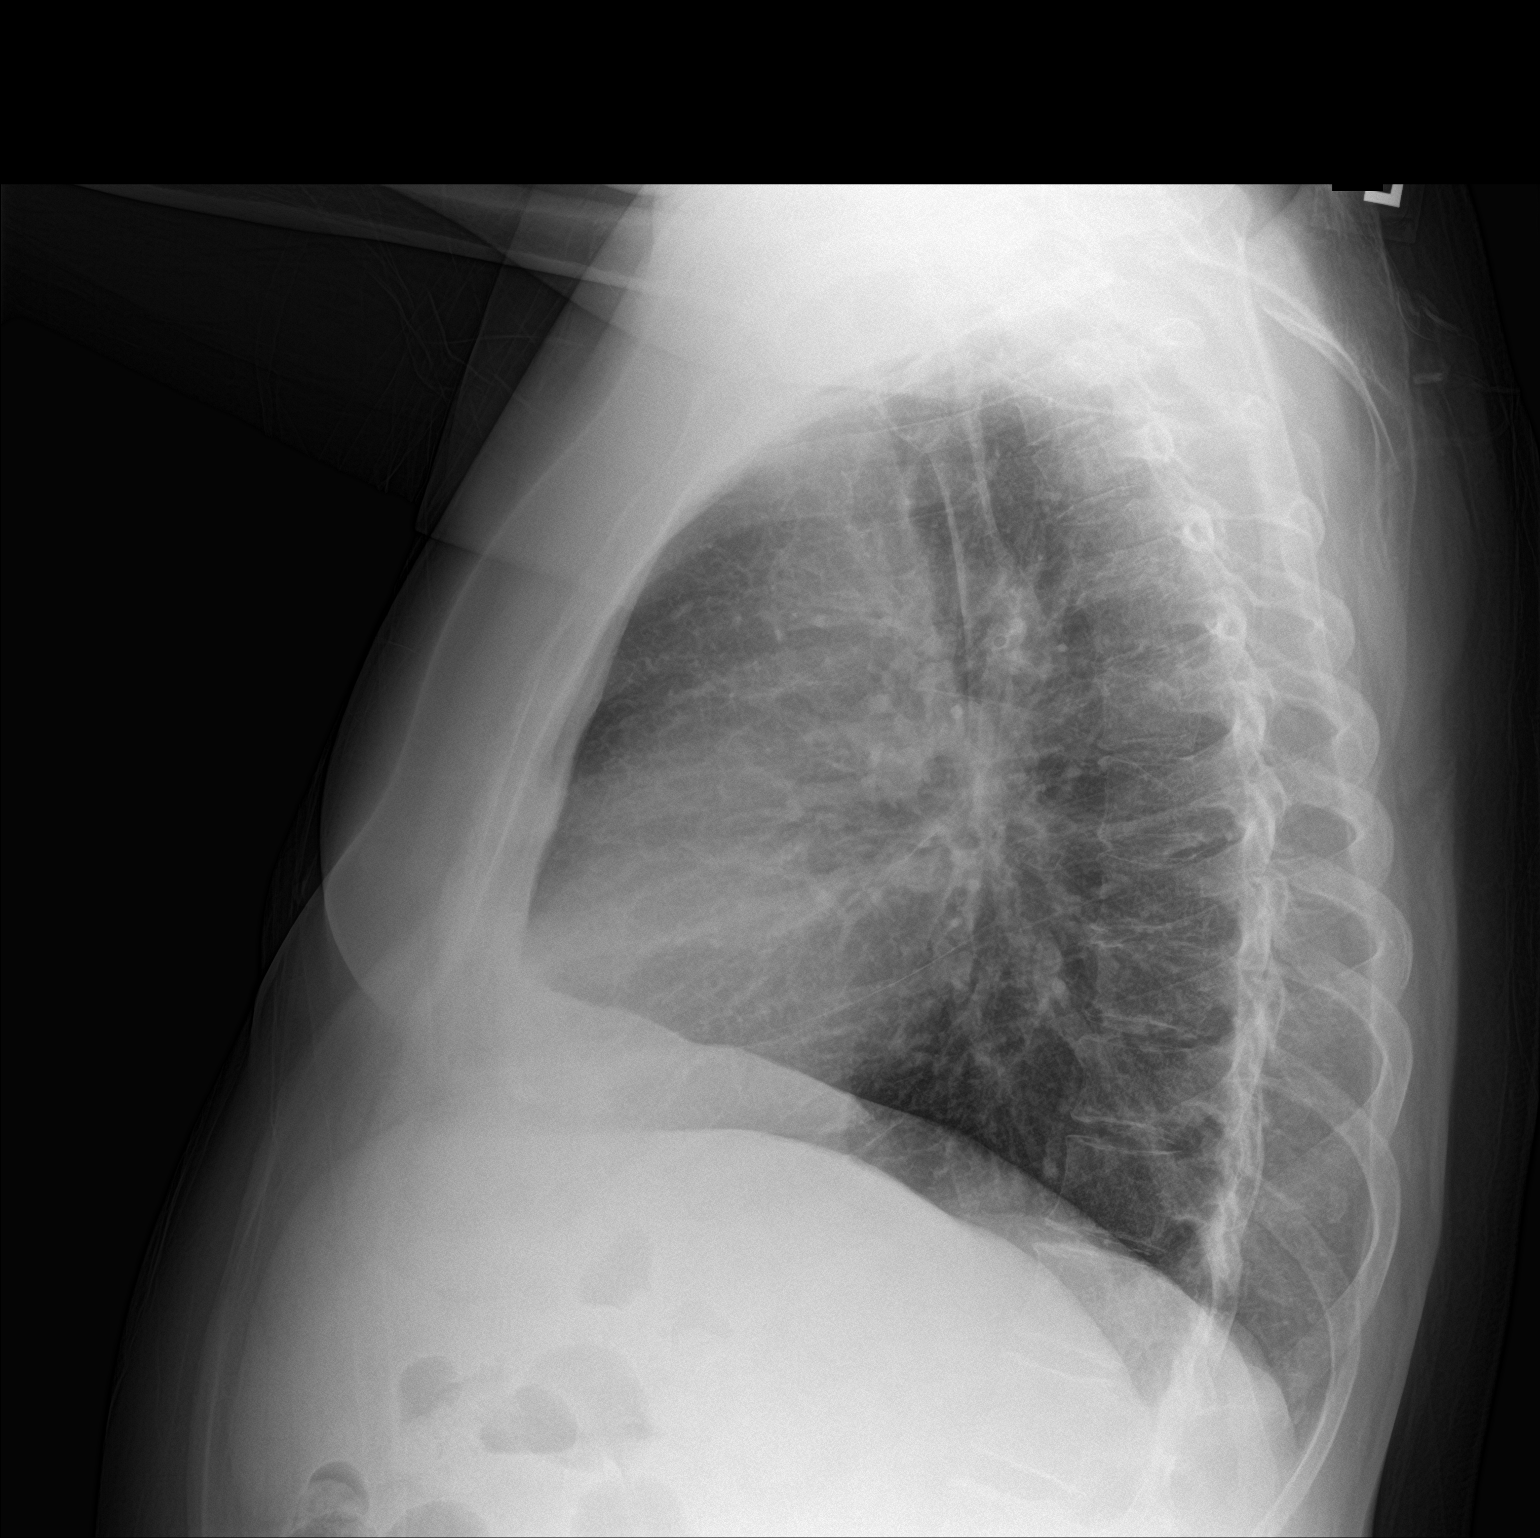

[2 of 2 positions shown; findings below may reference images not displayed]

FINDINGS: Normal heart size and mediastinal contours. No acute infiltrate or
edema. No effusion or pneumothorax. No acute osseous findings.
IMPRESSION: Negative chest.

## 2019-06-17 IMAGING — DX LEFT SHOULDER - 1 VIEW
3 series · 3 of 3 positions shown · non-contrast
Comparison: [DATE]

CLINICAL DATA: Left shoulder pain

EXAM:
LEFT SHOULDER - 1 VIEW

[shoulder ap (1 of 2)]
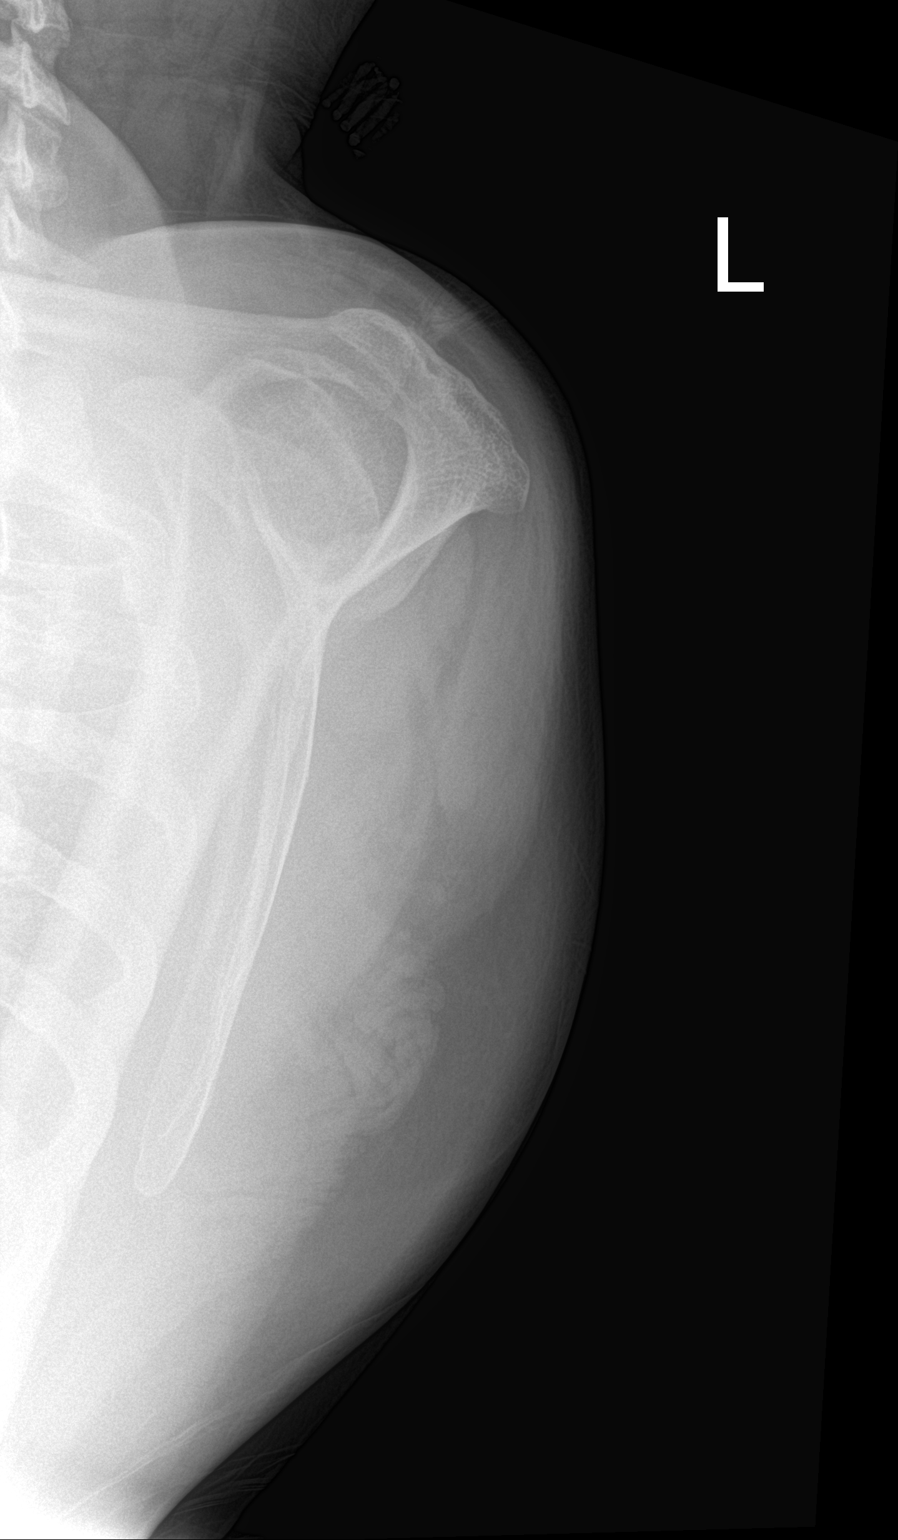

[shoulder obl]
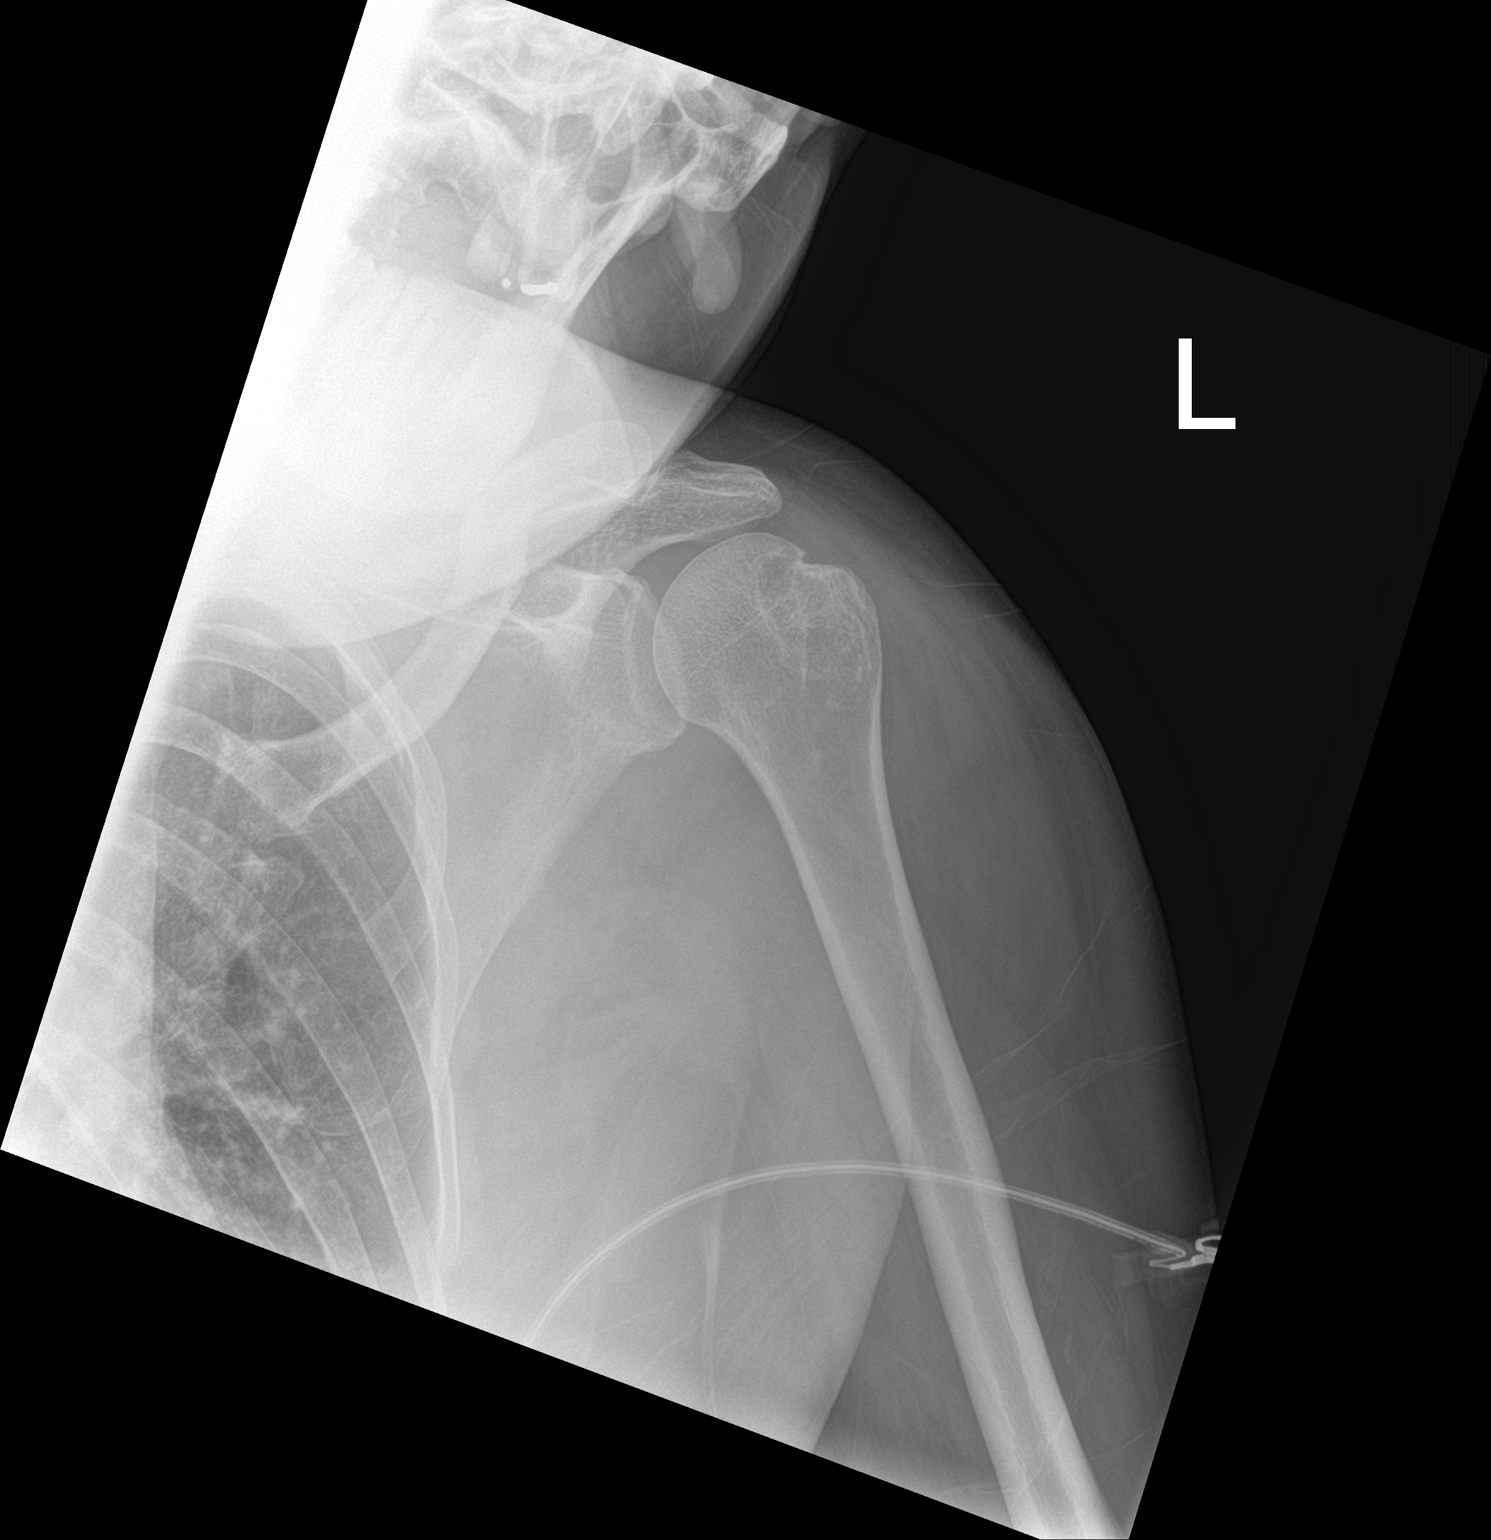

[shoulder ap (2 of 2)]
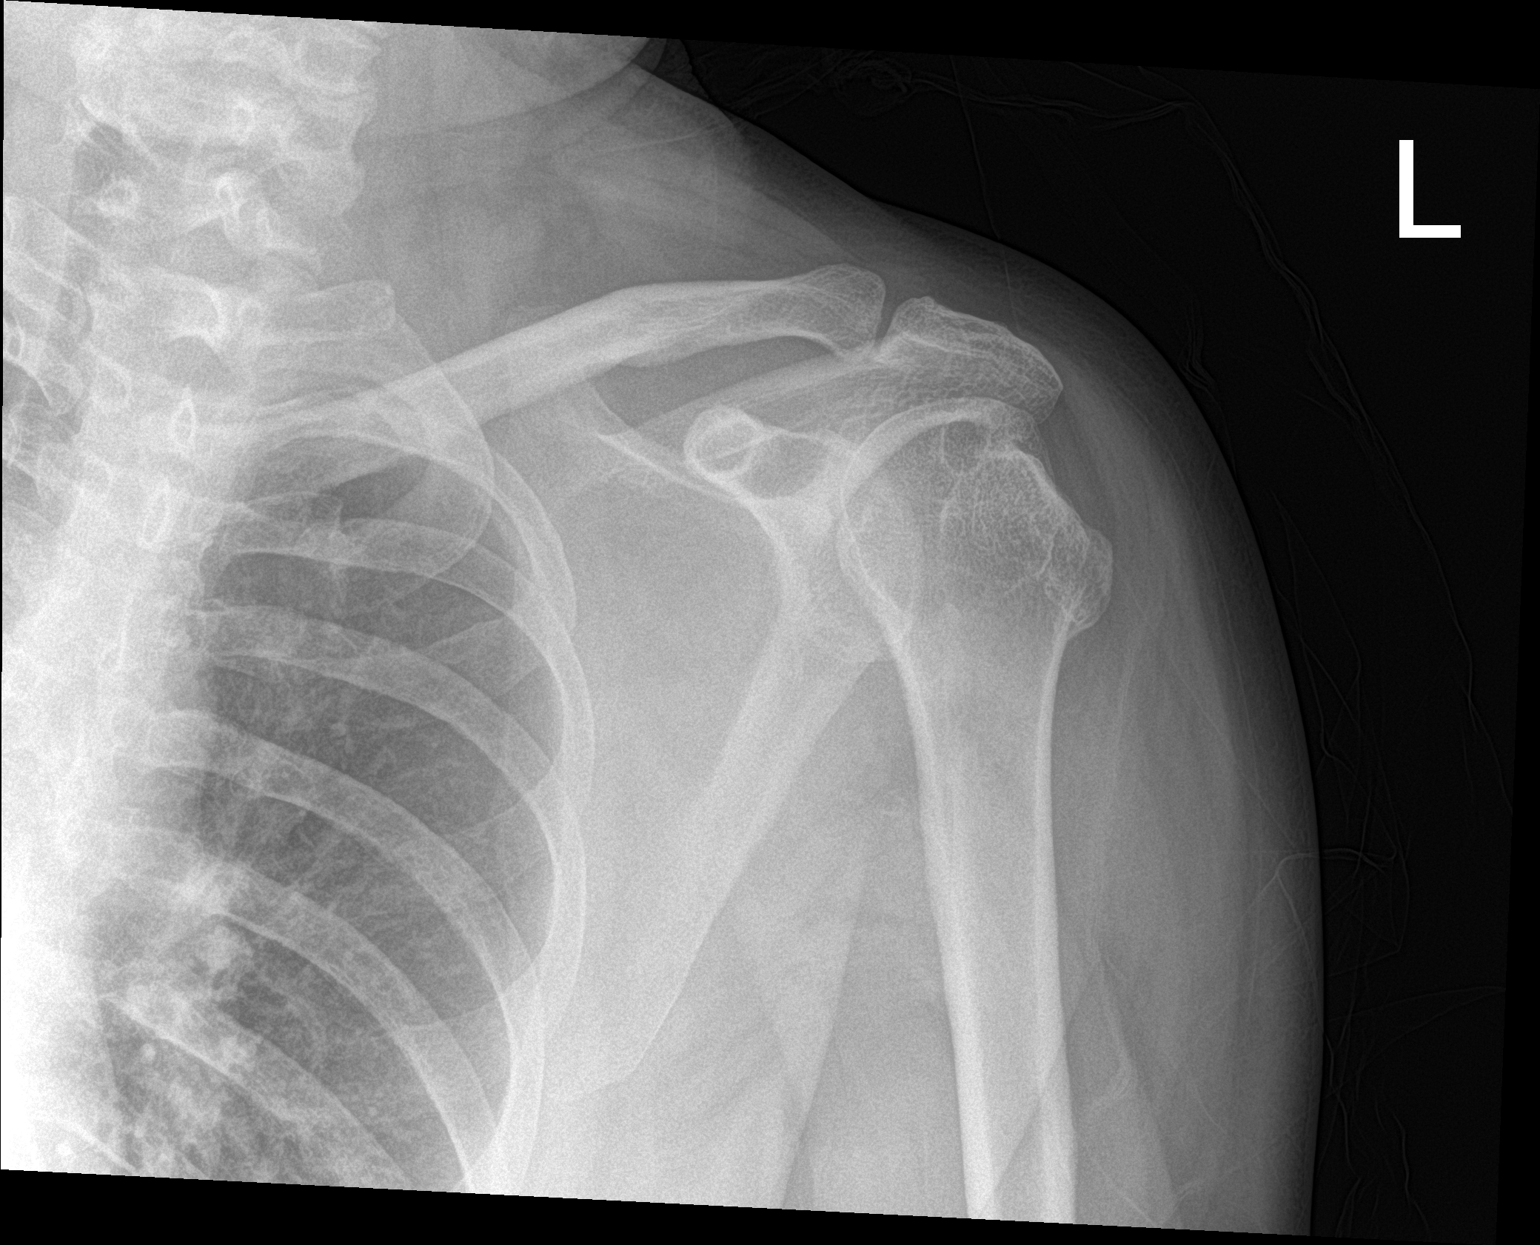

[3 of 3 positions shown; findings below may reference images not displayed]

FINDINGS: Chronic Hill-Sachs deformity. The glenohumeral joint and
acromioclavicular joint are located. Mild AC joint spurring.
IMPRESSION: 1. No acute finding.
2. Chronic Hill-Sachs deformity.

## 2019-06-17 MED ORDER — DICYCLOMINE HCL 10 MG PO CAPS: 10.0000 mg | ORAL_CAPSULE | Freq: Three times a day (TID) | ORAL | 0 refills | Status: DC | PRN

## 2019-06-17 MED ORDER — DICYCLOMINE HCL 10 MG PO CAPS
20.0000 mg | ORAL_CAPSULE | Freq: Once | ORAL | Status: AC
  Administered 2019-06-17: 20 mg via ORAL
  Filled 2019-06-17: qty 2

## 2019-06-17 MED ORDER — OMEPRAZOLE MAGNESIUM 20 MG PO TBEC: 20.0000 mg | DELAYED_RELEASE_TABLET | Freq: Every day | ORAL | 1 refills | Status: DC

## 2019-06-17 MED ORDER — SUCRALFATE 1 G PO TABS: 1.0000 g | ORAL_TABLET | Freq: Four times a day (QID) | ORAL | 1 refills | Status: DC | PRN

## 2019-06-17 MED ORDER — ONDANSETRON 4 MG PO TBDP: ORAL_TABLET | ORAL | 0 refills | Status: DC

## 2019-06-17 MED ORDER — LORAZEPAM 2 MG PO TABS
2.0000 mg | ORAL_TABLET | Freq: Once | ORAL | Status: AC
  Administered 2019-06-17: 2 mg via ORAL
  Filled 2019-06-17: qty 1

## 2019-06-17 MED ORDER — SODIUM CHLORIDE 0.9% FLUSH: 3.0000 mL | Freq: Once | INTRAVENOUS | Status: DC

## 2019-06-17 MED ORDER — KETOROLAC TROMETHAMINE 30 MG/ML IJ SOLN
30.0000 mg | Freq: Once | INTRAMUSCULAR | Status: AC
  Administered 2019-06-17: 30 mg via INTRAMUSCULAR
  Filled 2019-06-17: qty 1
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
## 2019-06-17 NOTE — ED Notes (Signed)
Per patient last use of cocaine was 2 days ago.

## 2019-06-17 NOTE — Discharge Instructions (Addendum)
Your workup in the Emergency Department today was reassuring.  We did not find any specific abnormalities.  Your ongoing abdominal pain is likely the result of your ongoing alcohol and drug abuse.  Please contact the included resources for help with your substance abuse.  We recommend you drink plenty of fluids, take your regular medications and/or any new ones prescribed today, and follow up with the doctor(s) listed in these documents as recommended.  Follow up with orthopedics regarding your left arm pain and gastroenterology for help with your ongoing abdominal pain.  Return to the Emergency Department if you develop new or worsening symptoms that concern you.

## 2019-06-17 NOTE — ED Triage Notes (Signed)
Patient coming into for left sided arm pain but denies chest pain. Patient states it has been going on x2 weeks but has gotten worse the last few days. Patient states that his abd has been hurting the last few days and has had some nausea. Patient denies shortness of breath.

## 2019-06-17 NOTE — ED Notes (Signed)
Patient transported to X-ray 

## 2019-06-17 NOTE — ED Notes (Signed)
Patient states having stomach pains for a couple of day and been getting worse and his left arm is hurting also. Along with his back and neck.

## 2019-06-17 NOTE — ED Notes (Addendum)
Pt sleepy but states he will stay in the lobby until he is more awake. Given shasta to drink.

## 2019-06-17 NOTE — ED Notes (Signed)
Pt c/o worsening pain in his abd and his left arm. Pt states he is also starting to feel short of breath. Slight increased work of breathing noted at this time.

## 2019-06-17 NOTE — ED Notes (Signed)
Pt discharged home after verbalizing understanding of discharge instructions; nad noted. 
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
## 2019-06-23 ENCOUNTER — Emergency Department (HOSPITAL_COMMUNITY)
Admission: EM | Admit: 2019-06-23 | Discharge: 2019-06-23 | Disposition: A | Discharge status: Home / Self Care | Payer: Medicare HMO | Attending: Emergency Medicine | Admitting: Emergency Medicine

## 2019-06-23 ENCOUNTER — Encounter (HOSPITAL_COMMUNITY): Payer: Self-pay | Admitting: *Deleted

## 2019-06-23 ENCOUNTER — Other Ambulatory Visit: Payer: Self-pay

## 2019-06-23 DIAGNOSIS — M792 Neuralgia and neuritis, unspecified: Secondary | ICD-10-CM

## 2019-06-23 DIAGNOSIS — M79602 Pain in left arm: Secondary | ICD-10-CM | POA: Diagnosis not present

## 2019-06-23 DIAGNOSIS — I10 Essential (primary) hypertension: Secondary | ICD-10-CM | POA: Insufficient documentation

## 2019-06-23 DIAGNOSIS — Z79899 Other long term (current) drug therapy: Secondary | ICD-10-CM | POA: Insufficient documentation

## 2019-06-23 DIAGNOSIS — I2510 Atherosclerotic heart disease of native coronary artery without angina pectoris: Secondary | ICD-10-CM | POA: Diagnosis not present

## 2019-06-23 DIAGNOSIS — F17210 Nicotine dependence, cigarettes, uncomplicated: Secondary | ICD-10-CM | POA: Insufficient documentation

## 2019-06-23 MED ORDER — PREDNISONE 20 MG PO TABS: ORAL_TABLET | ORAL | 0 refills | Status: DC

## 2019-06-23 MED ORDER — GABAPENTIN 100 MG PO CAPS: 100.0000 mg | ORAL_CAPSULE | Freq: Three times a day (TID) | ORAL | 0 refills | Status: DC
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
## 2019-06-23 NOTE — ED Notes (Signed)
Patient verbalizes understanding of discharge instructions. Opportunity for questioning and answers were provided. Pt discharged from ED. 

## 2019-07-12 ENCOUNTER — Emergency Department: Payer: Medicare HMO

## 2019-07-12 ENCOUNTER — Encounter: Payer: Self-pay | Admitting: Emergency Medicine

## 2019-07-12 ENCOUNTER — Inpatient Hospital Stay
Admission: EM | Admit: 2019-07-12 | Discharge: 2019-07-15 | DRG: 303 | Disposition: A | Discharge status: Home / Self Care | Payer: Medicare HMO | Attending: Internal Medicine | Admitting: Internal Medicine

## 2019-07-12 ENCOUNTER — Other Ambulatory Visit: Payer: Self-pay

## 2019-07-12 DIAGNOSIS — Z9119 Patient's noncompliance with other medical treatment and regimen: Secondary | ICD-10-CM | POA: Diagnosis not present

## 2019-07-12 DIAGNOSIS — Z880 Allergy status to penicillin: Secondary | ICD-10-CM | POA: Diagnosis not present

## 2019-07-12 DIAGNOSIS — R079 Chest pain, unspecified: Secondary | ICD-10-CM | POA: Diagnosis not present

## 2019-07-12 DIAGNOSIS — I10 Essential (primary) hypertension: Secondary | ICD-10-CM | POA: Diagnosis present

## 2019-07-12 DIAGNOSIS — R000 Tachycardia, unspecified: Secondary | ICD-10-CM | POA: Diagnosis present

## 2019-07-12 DIAGNOSIS — F1210 Cannabis abuse, uncomplicated: Secondary | ICD-10-CM | POA: Diagnosis present

## 2019-07-12 DIAGNOSIS — R7303 Prediabetes: Secondary | ICD-10-CM | POA: Diagnosis present

## 2019-07-12 DIAGNOSIS — E871 Hypo-osmolality and hyponatremia: Secondary | ICD-10-CM | POA: Diagnosis present

## 2019-07-12 DIAGNOSIS — R110 Nausea: Secondary | ICD-10-CM | POA: Diagnosis not present

## 2019-07-12 DIAGNOSIS — F1410 Cocaine abuse, uncomplicated: Secondary | ICD-10-CM | POA: Diagnosis present

## 2019-07-12 DIAGNOSIS — Z955 Presence of coronary angioplasty implant and graft: Secondary | ICD-10-CM | POA: Diagnosis not present

## 2019-07-12 DIAGNOSIS — R778 Other specified abnormalities of plasma proteins: Secondary | ICD-10-CM

## 2019-07-12 DIAGNOSIS — F1010 Alcohol abuse, uncomplicated: Secondary | ICD-10-CM | POA: Diagnosis present

## 2019-07-12 DIAGNOSIS — F10239 Alcohol dependence with withdrawal, unspecified: Secondary | ICD-10-CM | POA: Diagnosis not present

## 2019-07-12 DIAGNOSIS — R451 Restlessness and agitation: Secondary | ICD-10-CM | POA: Diagnosis not present

## 2019-07-12 DIAGNOSIS — Z8249 Family history of ischemic heart disease and other diseases of the circulatory system: Secondary | ICD-10-CM

## 2019-07-12 DIAGNOSIS — Z79899 Other long term (current) drug therapy: Secondary | ICD-10-CM

## 2019-07-12 DIAGNOSIS — I252 Old myocardial infarction: Secondary | ICD-10-CM

## 2019-07-12 DIAGNOSIS — Z20828 Contact with and (suspected) exposure to other viral communicable diseases: Secondary | ICD-10-CM | POA: Diagnosis present

## 2019-07-12 DIAGNOSIS — R7989 Other specified abnormal findings of blood chemistry: Secondary | ICD-10-CM | POA: Diagnosis not present

## 2019-07-12 DIAGNOSIS — F1910 Other psychoactive substance abuse, uncomplicated: Secondary | ICD-10-CM

## 2019-07-12 DIAGNOSIS — I255 Ischemic cardiomyopathy: Secondary | ICD-10-CM

## 2019-07-12 DIAGNOSIS — R0789 Other chest pain: Secondary | ICD-10-CM | POA: Diagnosis not present

## 2019-07-12 DIAGNOSIS — Z716 Tobacco abuse counseling: Secondary | ICD-10-CM

## 2019-07-12 DIAGNOSIS — I2489 Other forms of acute ischemic heart disease: Secondary | ICD-10-CM | POA: Diagnosis present

## 2019-07-12 DIAGNOSIS — R440 Auditory hallucinations: Secondary | ICD-10-CM | POA: Diagnosis not present

## 2019-07-12 DIAGNOSIS — Z8674 Personal history of sudden cardiac arrest: Secondary | ICD-10-CM | POA: Diagnosis not present

## 2019-07-12 DIAGNOSIS — I25110 Atherosclerotic heart disease of native coronary artery with unstable angina pectoris: Principal | ICD-10-CM | POA: Diagnosis present

## 2019-07-12 DIAGNOSIS — Z9114 Patient's other noncompliance with medication regimen: Secondary | ICD-10-CM

## 2019-07-12 DIAGNOSIS — Z811 Family history of alcohol abuse and dependence: Secondary | ICD-10-CM

## 2019-07-12 DIAGNOSIS — I200 Unstable angina: Secondary | ICD-10-CM | POA: Diagnosis not present

## 2019-07-12 DIAGNOSIS — I214 Non-ST elevation (NSTEMI) myocardial infarction: Secondary | ICD-10-CM | POA: Diagnosis present

## 2019-07-12 DIAGNOSIS — E876 Hypokalemia: Secondary | ICD-10-CM | POA: Diagnosis present

## 2019-07-12 DIAGNOSIS — R441 Visual hallucinations: Secondary | ICD-10-CM | POA: Diagnosis not present

## 2019-07-12 DIAGNOSIS — F17210 Nicotine dependence, cigarettes, uncomplicated: Secondary | ICD-10-CM | POA: Diagnosis not present

## 2019-07-12 DIAGNOSIS — F329 Major depressive disorder, single episode, unspecified: Secondary | ICD-10-CM | POA: Diagnosis present

## 2019-07-12 LAB — COMPREHENSIVE METABOLIC PANEL
ALT: 52 U/L — ABNORMAL HIGH (ref 0–44)
AST: 90 U/L — ABNORMAL HIGH (ref 15–41)
Albumin: 3.7 g/dL (ref 3.5–5.0)
Alkaline Phosphatase: 119 U/L (ref 38–126)
Anion gap: 10 (ref 5–15)
BUN: 8 mg/dL (ref 6–20)
CO2: 22 mmol/L (ref 22–32)
Calcium: 8.6 mg/dL — ABNORMAL LOW (ref 8.9–10.3)
Chloride: 99 mmol/L (ref 98–111)
Creatinine, Ser: 0.44 mg/dL — ABNORMAL LOW (ref 0.61–1.24)
GFR calc Af Amer: >60 mL/min (ref >60)
GFR calc non Af Amer: >60 mL/min (ref >60)
Glucose, Bld: 133 mg/dL — ABNORMAL HIGH (ref 70–99)
Potassium: 3 mmol/L — ABNORMAL LOW (ref 3.5–5.1)
Sodium: 131 mmol/L — ABNORMAL LOW (ref 135–145)
Total Bilirubin: 3.1 mg/dL — ABNORMAL HIGH (ref 0.3–1.2)
Total Protein: 7.5 g/dL (ref 6.5–8.1)

## 2019-07-12 LAB — GLUCOSE, CAPILLARY
Glucose-Capillary: 139 mg/dL — ABNORMAL HIGH (ref 70–99)
Glucose-Capillary: 123 mg/dL — ABNORMAL HIGH (ref 70–99)
Glucose-Capillary: 127 mg/dL — ABNORMAL HIGH (ref 70–99)

## 2019-07-12 LAB — HEMOGLOBIN A1C
Hgb A1c MFr Bld: 5.4 % (ref 4.8–5.6)
Mean Plasma Glucose: 108.28 mg/dL

## 2019-07-12 LAB — CBC WITH DIFFERENTIAL/PLATELET
Abs Immature Granulocytes: 0.03 10*3/uL (ref 0.00–0.07)
Basophils Absolute: 0 10*3/uL (ref 0.0–0.1)
Basophils Relative: 0 %
Eosinophils Absolute: 0.1 10*3/uL (ref 0.0–0.5)
Eosinophils Relative: 1 %
HCT: 41.1 % (ref 39.0–52.0)
Hemoglobin: 14.9 g/dL (ref 13.0–17.0)
Immature Granulocytes: 0 %
Lymphocytes Relative: 19 %
Lymphs Abs: 1.6 10*3/uL (ref 0.7–4.0)
MCH: 38.3 pg — ABNORMAL HIGH (ref 26.0–34.0)
MCHC: 36.3 g/dL — ABNORMAL HIGH (ref 30.0–36.0)
MCV: 105.7 fL — ABNORMAL HIGH (ref 80.0–100.0)
Monocytes Absolute: 0.6 10*3/uL (ref 0.1–1.0)
Monocytes Relative: 7 %
Neutro Abs: 5.9 10*3/uL (ref 1.7–7.7)
Neutrophils Relative %: 73 %
Platelets: 204 10*3/uL (ref 150–400)
RBC: 3.89 MIL/uL — ABNORMAL LOW (ref 4.22–5.81)
RDW: 12.7 % (ref 11.5–15.5)
WBC: 8.2 10*3/uL (ref 4.0–10.5)
nRBC: 0 % (ref 0.0–0.2)

## 2019-07-12 LAB — HEPARIN LEVEL (UNFRACTIONATED): Heparin Unfractionated: <0.1 IU/mL — ABNORMAL LOW (ref 0.30–0.70)

## 2019-07-12 LAB — SARS CORONAVIRUS 2 BY RT PCR (HOSPITAL ORDER, PERFORMED IN ~~LOC~~ HOSPITAL LAB): SARS Coronavirus 2: NEGATIVE

## 2019-07-12 LAB — PROTIME-INR
INR: 1 (ref 0.8–1.2)
Prothrombin Time: 12.7 seconds (ref 11.4–15.2)

## 2019-07-12 LAB — TROPONIN I (HIGH SENSITIVITY)
Troponin I (High Sensitivity): 53 ng/L — ABNORMAL HIGH (ref <18)
Troponin I (High Sensitivity): 56 ng/L — ABNORMAL HIGH (ref <18)
Troponin I (High Sensitivity): 58 ng/L — ABNORMAL HIGH (ref <18)

## 2019-07-12 LAB — APTT: aPTT: 28 seconds (ref 24–36)

## 2019-07-12 LAB — TSH: TSH: 1.572 u[IU]/mL (ref 0.350–4.500)

## 2019-07-12 IMAGING — DX PORTABLE CHEST - 1 VIEW
1 series · 1 of 1 positions shown · non-contrast
Comparison: Chest radiograph [DATE]

CLINICAL DATA: Chest pain

EXAM:
PORTABLE CHEST 1 VIEW

[chest ap]
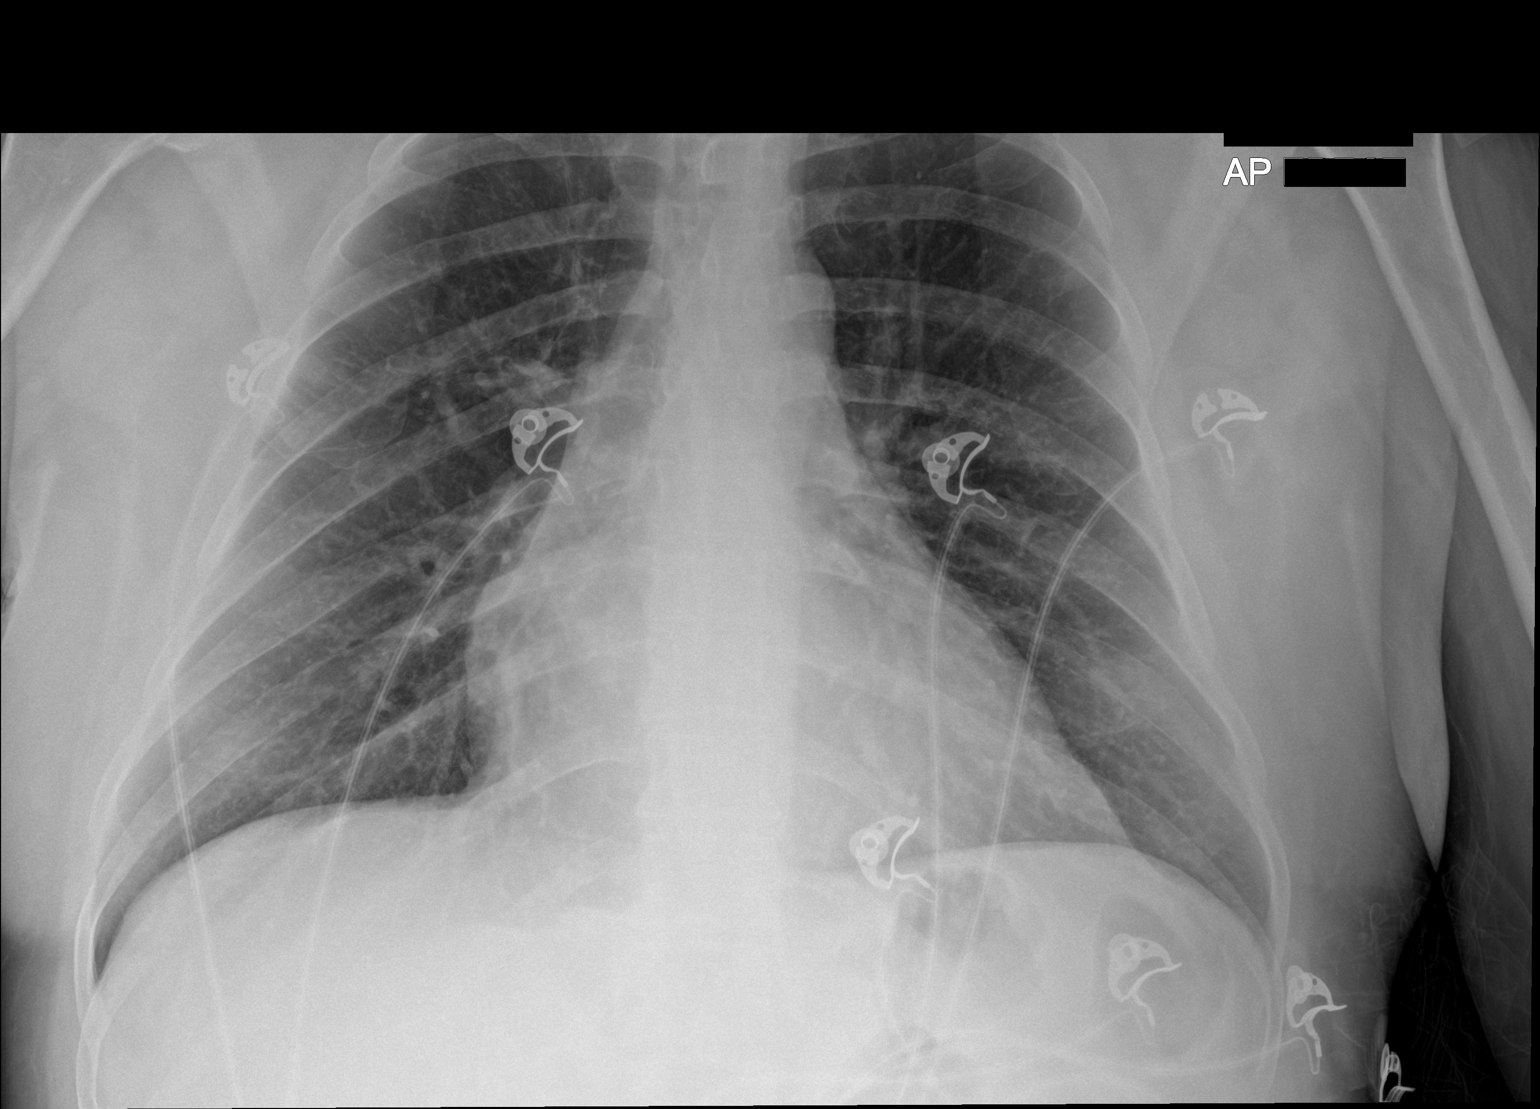

[1 of 1 positions shown; findings below may reference images not displayed]

FINDINGS: Single view AP erect radiograph.

The heart is not enlarged. The lungs are clear. No evidence of
pleural effusion or pneumothorax. The bones are unremarkable.
IMPRESSION: Clear lungs.

## 2019-07-12 MED ORDER — ASPIRIN EC 81 MG PO TBEC
81.0000 mg | DELAYED_RELEASE_TABLET | Freq: Every day | ORAL | Status: DC
  Administered 2019-07-13 – 2019-07-15 (×3): 81 mg via ORAL
  Filled 2019-07-12 (×3): qty 1

## 2019-07-12 MED ORDER — HEPARIN (PORCINE) 25000 UT/250ML-% IV SOLN: 10.0000 [IU]/kg/h | INTRAVENOUS | Status: DC

## 2019-07-12 MED ORDER — INSULIN ASPART 100 UNIT/ML ~~LOC~~ SOLN
0.0000 [IU] | Freq: Three times a day (TID) | SUBCUTANEOUS | Status: DC
  Administered 2019-07-12 (×2): 1 [IU] via SUBCUTANEOUS
  Filled 2019-07-12 (×2): qty 1

## 2019-07-12 MED ORDER — REGADENOSON 0.4 MG/5ML IV SOLN: 0.4000 mg | Freq: Once | INTRAVENOUS | Status: DC

## 2019-07-12 MED ORDER — LORAZEPAM 1 MG PO TABS: 1.0000 mg | ORAL_TABLET | Freq: Four times a day (QID) | ORAL | Status: DC | PRN

## 2019-07-12 MED ORDER — POTASSIUM CHLORIDE IN NACL 20-0.9 MEQ/L-% IV SOLN
INTRAVENOUS | Status: AC
  Administered 2019-07-12 – 2019-07-13 (×3): via INTRAVENOUS
  Filled 2019-07-12 (×5): qty 1000

## 2019-07-12 MED ORDER — ONDANSETRON HCL 4 MG/2ML IJ SOLN
4.0000 mg | Freq: Four times a day (QID) | INTRAMUSCULAR | Status: DC | PRN
  Administered 2019-07-14: 4 mg via INTRAVENOUS
  Filled 2019-07-12: qty 2

## 2019-07-12 MED ORDER — MORPHINE SULFATE (PF) 2 MG/ML IV SOLN
2.0000 mg | INTRAVENOUS | Status: DC | PRN
  Administered 2019-07-12 – 2019-07-15 (×15): 2 mg via INTRAVENOUS
  Filled 2019-07-12 (×16): qty 1

## 2019-07-12 MED ORDER — PANTOPRAZOLE SODIUM 40 MG PO TBEC
40.0000 mg | DELAYED_RELEASE_TABLET | Freq: Every day | ORAL | Status: DC
  Administered 2019-07-12 – 2019-07-15 (×4): 40 mg via ORAL
  Filled 2019-07-12 (×4): qty 1

## 2019-07-12 MED ORDER — LORAZEPAM 1 MG PO TABS
1.0000 mg | ORAL_TABLET | ORAL | Status: DC | PRN
  Administered 2019-07-12 – 2019-07-14 (×3): 1 mg via ORAL
  Filled 2019-07-12 (×3): qty 1

## 2019-07-12 MED ORDER — LORAZEPAM 2 MG/ML IJ SOLN
2.0000 mg | INTRAMUSCULAR | Status: DC | PRN
  Administered 2019-07-12 – 2019-07-14 (×3): 2 mg via INTRAVENOUS
  Filled 2019-07-12 (×3): qty 1

## 2019-07-12 MED ORDER — ADULT MULTIVITAMIN W/MINERALS CH
1.0000 | ORAL_TABLET | Freq: Every day | ORAL | Status: DC
  Administered 2019-07-12 – 2019-07-15 (×4): 1 via ORAL
  Filled 2019-07-12 (×4): qty 1

## 2019-07-12 MED ORDER — HEPARIN (PORCINE) 25000 UT/250ML-% IV SOLN
1500.0000 [IU]/h | INTRAVENOUS | Status: DC
  Administered 2019-07-12: 950 [IU]/h via INTRAVENOUS
  Administered 2019-07-13: 12 [IU]/h via INTRAVENOUS
  Administered 2019-07-13: 1500 [IU]/h via INTRAVENOUS
  Filled 2019-07-12 (×3): qty 250

## 2019-07-12 MED ORDER — ONDANSETRON HCL 4 MG/2ML IJ SOLN
4.0000 mg | Freq: Once | INTRAMUSCULAR | Status: AC
  Administered 2019-07-12: 10:00:00 4 mg via INTRAVENOUS
  Filled 2019-07-12: qty 2

## 2019-07-12 MED ORDER — ASPIRIN 300 MG RE SUPP: 300.0000 mg | RECTAL | Status: DC

## 2019-07-12 MED ORDER — NITROGLYCERIN 2 % TD OINT
0.5000 [in_us] | TOPICAL_OINTMENT | Freq: Once | TRANSDERMAL | Status: AC
  Administered 2019-07-12: 0.5 [in_us] via TOPICAL
  Filled 2019-07-12: qty 1

## 2019-07-12 MED ORDER — INSULIN ASPART 100 UNIT/ML ~~LOC~~ SOLN: 0.0000 [IU] | Freq: Every day | SUBCUTANEOUS | Status: DC

## 2019-07-12 MED ORDER — MORPHINE SULFATE (PF) 2 MG/ML IV SOLN
2.0000 mg | Freq: Once | INTRAVENOUS | Status: AC
  Administered 2019-07-12: 2 mg via INTRAVENOUS
  Filled 2019-07-12: qty 1

## 2019-07-12 MED ORDER — THIAMINE HCL 100 MG/ML IJ SOLN
100.0000 mg | Freq: Every day | INTRAMUSCULAR | Status: DC
  Filled 2019-07-12: qty 2

## 2019-07-12 MED ORDER — ACETAMINOPHEN 325 MG PO TABS: 650.0000 mg | ORAL_TABLET | ORAL | Status: DC | PRN

## 2019-07-12 MED ORDER — NITROGLYCERIN 2 % TD OINT
0.5000 [in_us] | TOPICAL_OINTMENT | Freq: Once | TRANSDERMAL | Status: AC
  Administered 2019-07-12: 08:00:00 0.5 [in_us] via TOPICAL
  Filled 2019-07-12: qty 1

## 2019-07-12 MED ORDER — HEPARIN BOLUS VIA INFUSION
4000.0000 [IU] | Freq: Once | INTRAVENOUS | Status: AC
  Administered 2019-07-12: 4000 [IU] via INTRAVENOUS
  Filled 2019-07-12: qty 4000

## 2019-07-12 MED ORDER — NITROGLYCERIN 0.4 MG SL SUBL: 0.4000 mg | SUBLINGUAL_TABLET | SUBLINGUAL | Status: DC | PRN

## 2019-07-12 MED ORDER — NICOTINE 21 MG/24HR TD PT24
21.0000 mg | MEDICATED_PATCH | Freq: Every day | TRANSDERMAL | Status: DC
  Administered 2019-07-12 – 2019-07-15 (×4): 21 mg via TRANSDERMAL
  Filled 2019-07-12 (×4): qty 1

## 2019-07-12 MED ORDER — GABAPENTIN 100 MG PO CAPS
100.0000 mg | ORAL_CAPSULE | Freq: Three times a day (TID) | ORAL | Status: DC
  Administered 2019-07-12 – 2019-07-15 (×9): 100 mg via ORAL
  Filled 2019-07-12 (×9): qty 1

## 2019-07-12 MED ORDER — DICYCLOMINE HCL 10 MG PO CAPS: 10.0000 mg | ORAL_CAPSULE | Freq: Three times a day (TID) | ORAL | Status: DC | PRN

## 2019-07-12 MED ORDER — LORAZEPAM 2 MG/ML IJ SOLN
1.0000 mg | Freq: Four times a day (QID) | INTRAMUSCULAR | Status: DC | PRN
  Administered 2019-07-12: 1 mg via INTRAVENOUS
  Filled 2019-07-12: qty 1

## 2019-07-12 MED ORDER — ATORVASTATIN CALCIUM 20 MG PO TABS
40.0000 mg | ORAL_TABLET | Freq: Every day | ORAL | Status: DC
  Administered 2019-07-12: 40 mg via ORAL
  Filled 2019-07-12: qty 2

## 2019-07-12 MED ORDER — CARVEDILOL 6.25 MG PO TABS
6.2500 mg | ORAL_TABLET | Freq: Two times a day (BID) | ORAL | Status: DC
  Administered 2019-07-12 – 2019-07-15 (×6): 6.25 mg via ORAL
  Filled 2019-07-12 (×6): qty 1

## 2019-07-12 MED ORDER — HEPARIN SODIUM (PORCINE) 5000 UNIT/ML IJ SOLN: 4000.0000 [IU] | Freq: Once | INTRAMUSCULAR | Status: DC

## 2019-07-12 MED ORDER — ASPIRIN 81 MG PO CHEW
324.0000 mg | CHEWABLE_TABLET | ORAL | Status: DC
  Filled 2019-07-12: qty 4

## 2019-07-12 MED ORDER — FOLIC ACID 1 MG PO TABS
1.0000 mg | ORAL_TABLET | Freq: Every day | ORAL | Status: DC
  Administered 2019-07-12 – 2019-07-15 (×4): 1 mg via ORAL
  Filled 2019-07-12 (×4): qty 1

## 2019-07-12 MED ORDER — VITAMIN B-1 100 MG PO TABS
100.0000 mg | ORAL_TABLET | Freq: Every day | ORAL | Status: DC
  Administered 2019-07-12 – 2019-07-15 (×4): 100 mg via ORAL
  Filled 2019-07-12 (×5): qty 1

## 2019-07-12 NOTE — ED Notes (Signed)
Blood glucose 139
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
## 2019-07-12 NOTE — ED Notes (Signed)
Assumed care of patient reports chest pain increased 8/10, patient has nitro patch on and states that does not help his pain, morphine helps for a little while. admitting md aware of patient chest pain. Vss. safety maintained. Labs drawn as ordered. Call light with in reach will monitor.

## 2019-07-12 NOTE — Care Management Obs Status (Signed)
[facility] NOTIFICATION   Patient Details  Name: Ethan Wells MRN: [id number] Date of Birth: Dec 21, 1970   Medicare Observation Status Notification Given:  Yes    Marshell Garfinkel, RN 07/12/2019, 12:47 PM

## 2019-07-12 NOTE — ED Notes (Signed)
second iv placed to left hand. Trop drawn and sent. Patient" asking who trhat woman in his window looking in with all the animals Patient reoriented to place" vss

## 2019-07-12 NOTE — Progress Notes (Signed)
Family Meeting Note  Advance Directive:yes  Today a meeting took place with the Patient.   The following clinical team members were present during this meeting:MD  The following were discussed:Patient's diagnosis: Chest pressure with pain, history of cardiomyopathy, hypertension, status post coronary artery disease, tobacco abuse, history of cocaine and marijuana abuse, alcohol abuse, hyponatremia, hypokalemia will be admitted to the hospital.  Treatment plan of care discussed in detail with the patient.,  He verbalized understanding of the plan   patient's progosis: Unable to determine and Goals for treatment: Full Code  His mom is the healthcare power of attorney  Additional follow-up to be provided: Hospitalist, cardiology  Time spent during discussion:17 min  Nicholes Mango, MD

## 2019-07-12 NOTE — ED Notes (Signed)
ED TO INPATIENT HANDOFF REPORT  ED Nurse Name and Phone #:    S Name/Age/Gender [name] 48 y.o. male Room/Bed: ED24A/ED24A  Code Status   Code Status: Full Code  Home/SNF/Other Home Patient oriented to: self, place, time and situation Is this baseline? Yes   Triage Complete: Triage complete  Chief Complaint chest pain  Triage Note Patient presents to the ED via EMS for chest pain x 3 days.  Patient reports chest pain is so severe that he had vomiting yesterday.  Patient has history of 3 previous heart attacks.  Patient has history of IV drug use and reports using cocaine 3 days ago.  Patient describes pain as pressure.  EMS gave patient 1 nitro spray and 4 baby aspirin.     Allergies Allergies  Allergen Reactions  . Penicillins Hives    Level of Care/Admitting Diagnosis ED Disposition    ED Disposition Condition [facility] Area: [place] [[id number]]  Level of Care: Telemetry [5]  Covid Evaluation: Person Under Investigation (PUI)  Diagnosis: Chest pain [401027]  Admitting Physician: Nicholes Mango [[id number]]  Attending Physician: Nicholes Mango [[id number]]  Bed request comments: 2a  PT Class (Do Not Modify): Observation [104]  PT Acc Code (Do Not Modify): Observation [10022]       B Medical/Surgery History Past Medical History:  Diagnosis Date  . CAD (coronary artery disease)    a. 01/2011 Anterior STEMI/Cath/PCI: LM nl, LAD 100d (3.5x12mm Vision BMS placed), LCX 67m, RI 50, RCA min irregs, EF 40% w/ apical, inferoapical HK.  . Cardiac arrest - ventricular fibrillation    a. In setting of STEMI 01/2011  . Cocaine abuse ([place])   . ETOH abuse    a. 6-12 beers/day  . Hemorrhoids   . Hypertension   . Ischemic cardiomyopathy    a. 06/2011 Echo: EF 45-50%, No rwma  . Marijuana abuse   . Tobacco abuse    a. 1/2 ppd x 26 yrs   History reviewed. No pertinent surgical history.   A IV Location/Drains/Wounds Patient  Lines/Drains/Airways Status   Active Line/Drains/Airways    Name:   Placement date:   Placement time:   Site:   Days:   Peripheral IV 07/12/19 Left Hand   07/12/19    0930    Hand   less than 1   Peripheral IV 07/12/19 Right Hand   07/12/19    1200    Hand   less than 1          Intake/Output Last 24 hours No intake or output data in the 24 hours ending 07/12/19 1316  Labs/Imaging Results for orders placed or performed during the hospital encounter of 07/12/19 (from the past 48 hour(s))  Comprehensive metabolic panel     Status: Abnormal   Collection Time: 07/12/19  7:45 AM  Result Value Ref Range   Sodium 131 (L) 135 - 145 mmol/L   Potassium 3.0 (L) 3.5 - 5.1 mmol/L   Chloride 99 98 - 111 mmol/L   CO2 22 22 - 32 mmol/L   Glucose, Bld 133 (H) 70 - 99 mg/dL   BUN 8 6 - 20 mg/dL   Creatinine, Ser 0.44 (L) 0.61 - 1.24 mg/dL   Calcium 8.6 (L) 8.9 - 10.3 mg/dL   Total Protein 7.5 6.5 - 8.1 g/dL   Albumin 3.7 3.5 - 5.0 g/dL   AST 90 (H) 15 - 41 U/L   ALT 52 (H) 0 - 44  U/L   Alkaline Phosphatase 119 38 - 126 U/L   Total Bilirubin 3.1 (H) 0.3 - 1.2 mg/dL   GFR calc non Af Amer >60 >60 mL/min   GFR calc Af Amer >60 >60 mL/min   Anion gap 10 5 - 15    Comment: Performed at [facility], [address] [place], [place] [address]  CBC with Differential     Status: Abnormal   Collection Time: 07/12/19  7:45 AM  Result Value Ref Range   WBC 8.2 4.0 - 10.5 K/uL   RBC 3.89 (L) 4.22 - 5.81 MIL/uL   Hemoglobin 14.9 13.0 - 17.0 g/dL   HCT 41.1 39.0 - 52.0 %   MCV 105.7 (H) 80.0 - 100.0 fL   MCH 38.3 (H) 26.0 - 34.0 pg   MCHC 36.3 (H) 30.0 - 36.0 g/dL   RDW 12.7 11.5 - 15.5 %   Platelets 204 150 - 400 K/uL   nRBC 0.0 0.0 - 0.2 %   Neutrophils Relative % 73 %   Neutro Abs 5.9 1.7 - 7.7 K/uL   Lymphocytes Relative 19 %   Lymphs Abs 1.6 0.7 - 4.0 K/uL   Monocytes Relative 7 %   Monocytes Absolute 0.6 0.1 - 1.0 K/uL   Eosinophils Relative 1 %   Eosinophils Absolute 0.1 0.0  - 0.5 K/uL   Basophils Relative 0 %   Basophils Absolute 0.0 0.0 - 0.1 K/uL   Immature Granulocytes 0 %   Abs Immature Granulocytes 0.03 0.00 - 0.07 K/uL    Comment: Performed at [facility], [place], [place] [address]  Troponin I (High Sensitivity)     Status: Abnormal   Collection Time: 07/12/19  7:45 AM  Result Value Ref Range   Troponin I (High Sensitivity) 53 (H) <18 ng/L    Comment: (NOTE) Elevated high sensitivity troponin I (hsTnI) values and significant  changes across serial measurements may suggest ACS but many other  chronic and acute conditions are known to elevate hsTnI results.  Refer to the "Links" section for chest pain algorithms and additional  guidance. Performed at [name], [address] [place], [place] [address]   SARS Coronavirus 2 (CEPHEID- Performed in [facility] hospital lab), Hosp Order     Status: None   Collection Time: 07/12/19  9:55 AM   Specimen: Nasopharyngeal Swab  Result Value Ref Range   SARS Coronavirus 2 NEGATIVE NEGATIVE    Comment: (NOTE) If result is NEGATIVE SARS-CoV-2 target nucleic acids are NOT DETECTED. The SARS-CoV-2 RNA is generally detectable in upper and lower  respiratory specimens during the acute phase of infection. The lowest  concentration of SARS-CoV-2 viral copies this assay can detect is 250  copies / mL. A negative result does not preclude SARS-CoV-2 infection  and should not be used as the sole basis for treatment or other  patient management decisions.  A negative result may occur with  improper specimen collection / handling, submission of specimen other  than nasopharyngeal swab, presence of viral mutation(s) within the  areas targeted by this assay, and inadequate number of viral copies  (<250 copies / mL). A negative result must be combined with clinical  observations, patient history, and epidemiological information. If result is POSITIVE SARS-CoV-2 target nucleic  acids are DETECTED. The SARS-CoV-2 RNA is generally detectable in upper and lower  respiratory specimens dur ing the acute phase of infection.  Positive  results are indicative of active infection with SARS-CoV-2.  Clinical  correlation with patient history and other diagnostic information is  necessary to determine patient infection status.  Positive results do  not rule out bacterial infection or co-infection with other viruses. If result is PRESUMPTIVE POSTIVE SARS-CoV-2 nucleic acids MAY BE PRESENT.   A presumptive positive result was obtained on the submitted specimen  and confirmed on repeat testing.  While 2019 novel coronavirus  (SARS-CoV-2) nucleic acids may be present in the submitted sample  additional confirmatory testing may be necessary for epidemiological  and / or clinical management purposes  to differentiate between  SARS-CoV-2 and other Sarbecovirus currently known to infect humans.  If clinically indicated additional testing with an alternate test  methodology [phone] is advised. The SARS-CoV-2 RNA is generally  detectable in upper and lower respiratory sp ecimens during the acute  phase of infection. The expected result is Negative. Fact Sheet for Patients:  StrictlyIdeas.no Fact Sheet for Healthcare Providers: BankingDealers.co.za This test is not yet approved or cleared by the [place] FDA and has been authorized for detection and/or diagnosis of SARS-CoV-2 by FDA under an Emergency Use Authorization (EUA).  This EUA will remain in effect (meaning this test can be used) for the duration of the COVID-19 declaration under Section 564(b)(1) of the Act, 21 U.S.C. section 360bbb-3(b)(1), unless the authorization is terminated or revoked sooner. Performed at [facility], [address] [place], [place] [address]   APTT     Status: None   Collection Time: 07/12/19  9:56 AM  Result Value Ref Range    aPTT 28 24 - 36 seconds    Comment: Performed at [facility], [address] [place], [place] [address]  Protime-INR     Status: None   Collection Time: 07/12/19  9:56 AM  Result Value Ref Range   Prothrombin Time 12.7 11.4 - 15.2 seconds   INR 1.0 0.8 - 1.2    Comment: (NOTE) INR goal varies based on device and disease states. Performed at [facility], [place], [place] [address]   Troponin I (High Sensitivity)     Status: Abnormal   Collection Time: 07/12/19 11:38 AM  Result Value Ref Range   Troponin I (High Sensitivity) 56 (H) <18 ng/L    Comment: (NOTE) Elevated high sensitivity troponin I (hsTnI) values and significant  changes across serial measurements may suggest ACS but many other  chronic and acute conditions are known to elevate hsTnI results.  Refer to the "Links" section for chest pain algorithms and additional  guidance. Performed at [facility], [address] [place], [place] [address]   Glucose, capillary     Status: Abnormal   Collection Time: 07/12/19 11:59 AM  Result Value Ref Range   Glucose-Capillary 139 (H) 70 - 99 mg/dL   Dg Chest Portable 1 View  Result Date: 07/12/2019 CLINICAL DATA:  Chest pain EXAM: PORTABLE CHEST 1 VIEW COMPARISON:  Chest radiograph 06/17/2019 FINDINGS: Single view AP erect radiograph. The heart is not enlarged. The lungs are clear. No evidence of pleural effusion or pneumothorax. The bones are unremarkable. IMPRESSION: Clear lungs. Electronically Signed   By: Kellie Simmering   On: 07/12/2019 08:19    Pending Labs Unresulted Labs (From admission, onward)    Start     Ordered   07/13/19 0500  Hemoglobin A1c  Tomorrow morning,   STAT     07/12/19 1245   07/13/19 0500  Lipid panel  Tomorrow morning,   STAT  07/12/19 1245   07/13/19 0500  CBC  Tomorrow morning,   STAT     07/12/19 1245   07/13/19 7322  Basic metabolic panel  Tomorrow morning,   STAT     07/12/19 1245   07/13/19  0500  Magnesium  Tomorrow morning,   STAT     07/12/19 1200   07/12/19 1830  Heparin level (unfractionated)  Once-Timed,   STAT     07/12/19 1057   07/12/19 1246  HIV antibody (Routine Testing)  Once,   STAT     07/12/19 1245   07/12/19 1149  Hemoglobin A1c  Once,   STAT    Comments: To assess prior glycemic control    07/12/19 1149   07/12/19 1103  Urine Drug Screen, Qualitative ([facility] only)  Once,   STAT     07/12/19 1102          Vitals/Pain Today's Vitals   07/12/19 1107 07/12/19 1139 07/12/19 1201 07/12/19 1316  BP:  (!) 138/94 (!) 145/84   Pulse:  (!) 106 100   Resp:  17    Temp:  98.3 F (36.8 C)    TempSrc:  Oral    SpO2:  99%    Weight:      Height:      PainSc: 7  8   5      Isolation Precautions No active isolations  Medications Medications  heparin ADULT infusion 100 units/mL (25000 units/270mL sodium chloride 0.45%) (950 Units/hr Intravenous New Bag/Given 07/12/19 1031)  dicyclomine (BENTYL) capsule 10 mg (has no administration in time range)  pantoprazole (PROTONIX) EC tablet 40 mg (has no administration in time range)  gabapentin (NEURONTIN) capsule 100 mg (has no administration in time range)  aspirin chewable tablet 324 mg (has no administration in time range)    Or  aspirin suppository 300 mg (has no administration in time range)  aspirin EC tablet 81 mg (has no administration in time range)  nitroGLYCERIN (NITROSTAT) SL tablet 0.4 mg (has no administration in time range)  acetaminophen (TYLENOL) tablet 650 mg (has no administration in time range)  ondansetron (ZOFRAN) injection 4 mg (has no administration in time range)  atorvastatin (LIPITOR) tablet 40 mg (has no administration in time range)  morphine 2 MG/ML injection 2 mg (2 mg Intravenous Given 07/12/19 1231)  insulin aspart (novoLOG) injection 0-9 Units (1 Units Subcutaneous Given 07/12/19 1200)  insulin aspart (novoLOG) injection 0-5 Units (has no administration in time range)  LORazepam  (ATIVAN) tablet 1 mg (has no administration in time range)    Or  LORazepam (ATIVAN) injection 1 mg (has no administration in time range)  thiamine (VITAMIN B-1) tablet 100 mg (100 mg Oral Given 07/12/19 1232)    Or  thiamine (B-1) injection 100 mg ( Intravenous See Alternative 0/25/42 7062)  folic acid (FOLVITE) tablet 1 mg (1 mg Oral Given 07/12/19 1232)  multivitamin with minerals tablet 1 tablet (1 tablet Oral Given 07/12/19 1233)  0.9 % NaCl with KCl 20 mEq/ L  infusion ( Intravenous New Bag/Given 07/12/19 1231)  nitroGLYCERIN (NITROGLYN) 2 % ointment 0.5 inch (0.5 inches Topical Given 07/12/19 0808)  nitroGLYCERIN (NITROGLYN) 2 % ointment 0.5 inch (0.5 inches Topical Given 07/12/19 0947)  ondansetron (ZOFRAN) injection 4 mg (4 mg Intravenous Given 07/12/19 0947)  morphine 2 MG/ML injection 2 mg (2 mg Intravenous Given 07/12/19 0947)  heparin bolus via infusion 4,000 Units (4,000 Units Intravenous Bolus from Bag 07/12/19 1032)    Mobility walks Low fall  risk   Focused Assessments      R Recommendations: See Admitting Provider Note  Report given to:   Additional Notes:

## 2019-07-12 NOTE — Progress Notes (Addendum)
ANTICOAGULATION CONSULT NOTE - Initial Consult  Pharmacy Consult for Heparin Indication: chest pain/ACS  Allergies  Allergen Reactions  . Penicillins Hives   Patient Measurements: Height: 5\' 5"  (165.1 cm) Weight: 183 lb (83 kg) IBW/kg (Calculated) : 61.5 Heparin Dosing Weight: 78.3Kg  Vital Signs: Temp: 98.2 F (36.8 C) (07/27 1643) Temp Source: Oral (07/27 1643) BP: 127/85 (07/27 1643) Pulse Rate: 113 (07/27 1643)  Labs: Recent Labs    07/12/19 0745 07/12/19 0956 07/12/19 1138 07/12/19 1406 07/12/19 1755  HGB 14.9  --   --   --   --   HCT 41.1  --   --   --   --   PLT 204  --   --   --   --   APTT  --  28  --   --   --   LABPROT  --  12.7  --   --   --   INR  --  1.0  --   --   --   HEPARINUNFRC  --   --   --   --  <0.10*  CREATININE 0.44*  --   --   --   --   TROPONINIHS 53*  --  56* 58*  --    Estimated Creatinine Clearance: 113.2 mL/min (A) (by C-G formula based on SCr of 0.44 mg/dL (L)).  Medical History: Past Medical History:  Diagnosis Date  . CAD (coronary artery disease)    a. 01/2011 Anterior STEMI/Cath/PCI: LM nl, LAD 100d (3.5x80mm Vision BMS placed), LCX 60m, RI 50, RCA min irregs, EF 40% w/ apical, inferoapical HK.  . Cardiac arrest - ventricular fibrillation    a. In setting of STEMI 01/2011  . Cocaine abuse ([place])   . ETOH abuse    a. 6-12 beers/day  . Hemorrhoids   . Hypertension   . Ischemic cardiomyopathy    a. 06/2011 Echo: EF 45-50%, No rwma  . Marijuana abuse   . Tobacco abuse    a. 1/2 ppd x 26 yrs   Medications:  Medications Prior to Admission  Medication Sig Dispense Refill Last Dose  . gabapentin (NEURONTIN) 100 MG capsule Take 1 capsule (100 mg total) by mouth 3 (three) times daily. 60 capsule 0 Past Week at Unknown time  . omeprazole (PRILOSEC OTC) 20 MG tablet Take 1 tablet (20 mg total) by mouth daily. 28 tablet 1 Past Week at Unknown time  . predniSONE (DELTASONE) 20 MG tablet 3 tabs po day one, then 2 tabs daily x 4 days 11  tablet 0 Past Week at Unknown time  . dicyclomine (BENTYL) 10 MG capsule Take 1 capsule (10 mg total) by mouth 3 (three) times daily as needed for up to 14 days for spasms. or abdominal pain 30 capsule 0   . feeding supplement, ENSURE ENLIVE, (ENSURE ENLIVE) LIQD Take 237 mLs by mouth 2 (two) times daily between meals. 90 Bottle 0   . Multiple Vitamin (MULTIVITAMIN WITH MINERALS) TABS tablet Take 1 tablet by mouth daily. (Patient not taking: Reported on 07/12/2019) 180 tablet 0 Not Taking at Unknown time  . omeprazole (PRILOSEC) 20 MG capsule Take 1 capsule (20 mg total) by mouth 2 (two) times daily. 60 capsule 1   . ondansetron (ZOFRAN ODT) 4 MG disintegrating tablet Allow 1-2 tablets to dissolve in your mouth every 8 hours as needed for nausea/vomiting (Patient not taking: Reported on 07/12/2019) 30 tablet 0 Completed Course at Unknown time  . sucralfate (CARAFATE) 1 g  tablet Take 1 tablet (1 g total) by mouth 4 (four) times daily as needed (for abdominal discomfort, nausea, and/or vomiting). (Patient not taking: Reported on 07/12/2019) 30 tablet 1 Not Taking at Unknown time  . traMADol (ULTRAM) 50 MG tablet Take 1 tablet (50 mg total) by mouth every 6 (six) hours as needed. (Patient not taking: Reported on 07/12/2019) 6 tablet 0 Completed Course at Unknown time   Assessment: Patient presents to the ED via EMS for chest pain x 3 days.  Patient reports chest pain is so severe that he had vomiting yesterday.  Patient has history of 3 previous heart attacks.  Patient has history of IV drug use and reports using cocaine 3 days ago.  Patient describes pain as pressure.  EMS gave patient 1 nitro spray and 4 baby aspirin.    7/27 HL@[id number] : <0.10. Called and asked nurse if line was stopped for any reason. Nurse stated that patient did pull IV out and began chewing line but at most it was stopped for 10 minutes.  Goal of Therapy:  Heparin level 0.3-0.7 units/ml Monitor platelets by anticoagulation protocol:  Yes   Plan:  Will rebolus Heparin 4000 units IV x 1 Will increase infusion to 1200 units/hr Check Heparin level in 6 hours then daily Monitor labs, platelets, s/sx of bleeding complications  Kalei Meda A Antonis Lor 07/12/2019,7:10 PM

## 2019-07-12 NOTE — ED Notes (Signed)
Report given to receiving nurse

## 2019-07-12 NOTE — H&P (Signed)
[facility] [place] NAME: Ethan Wells    MR#:  [id number]  DATE OF BIRTH:  [birth year]  DATE OF ADMISSION:  07/12/2019  PRIMARY CARE PHYSICIAN: Medicine, [facility]   REQUESTING/REFERRING PHYSICIAN: Rip Harbour  CHIEF COMPLAINT:   Crushing chest pain HISTORY OF PRESENT ILLNESS:  Ethan Wells  is a 48 y.o. male with a known history of coronary artery disease,status post stent placement sees [facility] cardiology, history of ischemic cardiomyopathy, hypertension continues to smoke, drink and uses cocaine and marijuana has been having 2-day history of chest pain which has been gradually getting worse.  Patient came into the ED. initial troponin at 53 but as patient is complaining of severe chest pain patient is started on heparin drip by ED physician for unstable angina.  Patient states that he smokes marijuana and cocaine, smokes cigarettes and drinks alcohol to cope with his depression.  Patient is requesting morphine again for his chest pain during my examination  PAST MEDICAL HISTORY:   Past Medical History:  Diagnosis Date  . CAD (coronary artery disease)    a. 01/2011 Anterior STEMI/Cath/PCI: LM nl, LAD 100d (3.5x17mm Vision BMS placed), LCX 52m, RI 50, RCA min irregs, EF 40% w/ apical, inferoapical HK.  . Cardiac arrest - ventricular fibrillation    a. In setting of STEMI 01/2011  . Cocaine abuse ([place])   . ETOH abuse    a. 6-12 beers/day  . Hemorrhoids   . Hypertension   . Ischemic cardiomyopathy    a. 06/2011 Echo: EF 45-50%, No rwma  . Marijuana abuse   . Tobacco abuse    a. 1/2 ppd x 26 yrs    PAST SURGICAL HISTOIRY:  Status post cardiac cath and stent placement  SOCIAL HISTORY:   Social History   Tobacco Use  . Smoking status: Current Every Day Smoker    Packs/day: 0.50    Years: 26.00    Pack years: 13.00    Types: Cigarettes  . Smokeless tobacco: Never Used  Substance Use Topics  . Alcohol use:  Yes    Comment: at least a 6 pack of beer daily and often a 12 pack    FAMILY HISTORY:   Family History  Problem Relation Age of Onset  . Coronary artery disease Mother        alive  . Other Other        2 sisters alive and well  . Alcohol abuse Father        died @ 28    DRUG ALLERGIES:   Allergies  Allergen Reactions  . Penicillins Hives    REVIEW OF SYSTEMS:  CONSTITUTIONAL: No fever, fatigue or weakness.  EYES: No blurred or double vision.  EARS, NOSE, AND THROAT: No tinnitus or ear pain.  RESPIRATORY: No cough, reports shortness of breath with exertion , denies wheezing or hemoptysis.  CARDIOVASCULAR: Reporting chest pain, shortness of breath denies orthopnea, edema.  GASTROINTESTINAL: No nausea, vomiting, diarrhea or abdominal pain.  GENITOURINARY: No dysuria, hematuria.  ENDOCRINE: No polyuria, nocturia,  HEMATOLOGY: No anemia, easy bruising or bleeding SKIN: No rash or lesion. MUSCULOSKELETAL: No joint pain or arthritis.   NEUROLOGIC: No tingling, numbness, weakness.  PSYCHIATRY: No anxiety or depression.   MEDICATIONS AT HOME:   Prior to Admission medications   Medication Sig Start Date End Date Taking? Authorizing Provider  gabapentin (NEURONTIN) 100 MG capsule Take 1 capsule (100 mg total) by mouth 3 (three) times daily. 06/23/19  Yes Domenic Moras, PA-C  omeprazole (PRILOSEC OTC) 20 MG tablet Take 1 tablet (20 mg total) by mouth daily. 06/17/19 06/16/20 Yes Hinda Kehr, MD  predniSONE (DELTASONE) 20 MG tablet 3 tabs po day one, then 2 tabs daily x 4 days 06/23/19  Yes Domenic Moras, PA-C  dicyclomine (BENTYL) 10 MG capsule Take 1 capsule (10 mg total) by mouth 3 (three) times daily as needed for up to 14 days for spasms. or abdominal pain 06/17/19 07/01/19  Hinda Kehr, MD  feeding supplement, ENSURE ENLIVE, (ENSURE ENLIVE) LIQD Take 237 mLs by mouth 2 (two) times daily between meals. 05/27/18   Salary, Avel Peace, MD  Multiple Vitamin (MULTIVITAMIN WITH MINERALS) TABS  tablet Take 1 tablet by mouth daily. Patient not taking: Reported on 07/12/2019 05/28/18   Salary, Avel Peace, MD  omeprazole (PRILOSEC) 20 MG capsule Take 1 capsule (20 mg total) by mouth 2 (two) times daily. 04/19/18 04/19/19  Hinda Kehr, MD  ondansetron (ZOFRAN ODT) 4 MG disintegrating tablet Allow 1-2 tablets to dissolve in your mouth every 8 hours as needed for nausea/vomiting Patient not taking: Reported on 07/12/2019 06/17/19   Hinda Kehr, MD  sucralfate (CARAFATE) 1 g tablet Take 1 tablet (1 g total) by mouth 4 (four) times daily as needed (for abdominal discomfort, nausea, and/or vomiting). Patient not taking: Reported on 07/12/2019 06/17/19   Hinda Kehr, MD  traMADol (ULTRAM) 50 MG tablet Take 1 tablet (50 mg total) by mouth every 6 (six) hours as needed. Patient not taking: Reported on 07/12/2019 07/26/18 07/26/19  Laban Emperor, PA-C      VITAL SIGNS:  Blood pressure (!) 138/94, pulse (!) 106, temperature 98.3 F (36.8 C), temperature source Oral, resp. rate 17, height 5\' 5"  (1.651 m), weight 81.6 kg, SpO2 99 %.  PHYSICAL EXAMINATION:  GENERAL:  48 y.o.-year-old patient lying in the bed with no acute distress.  EYES: Pupils equal, round, reactive to light and accommodation. No scleral icterus. Extraocular muscles intact.  HEENT: Head atraumatic, normocephalic. Oropharynx and nasopharynx clear.  NECK:  Supple, no jugular venous distention. No thyroid enlargement, no tenderness.  LUNGS: Normal breath sounds bilaterally, no wheezing, rales,rhonchi or crepitation. No use of accessory muscles of respiration.  CARDIOVASCULAR: S1, S2 normal. No murmurs, rubs, or gallops.  Nonreproducible chest pain ABDOMEN: Soft, nontender, nondistended. Bowel sounds present.  EXTREMITIES: No pedal edema, cyanosis, or clubbing.  NEUROLOGIC: Cranial nerves II through XII are intact. Muscle strength 5/5 in all extremities. Sensation intact. Gait not checked.  PSYCHIATRIC: The patient is alert and oriented x 3.   SKIN: No obvious rash, lesion, or ulcer.   LABORATORY PANEL:   CBC Recent Labs  Lab 07/12/19 0745  WBC 8.2  HGB 14.9  HCT 41.1  PLT 204   ------------------------------------------------------------------------------------------------------------------  Chemistries  Recent Labs  Lab 07/12/19 0745  NA 131*  K 3.0*  CL 99  CO2 22  GLUCOSE 133*  BUN 8  CREATININE 0.44*  CALCIUM 8.6*  AST 90*  ALT 52*  ALKPHOS 119  BILITOT 3.1*   ------------------------------------------------------------------------------------------------------------------  Cardiac Enzymes No results for input(s): TROPONINI in the last 168 hours. ------------------------------------------------------------------------------------------------------------------  RADIOLOGY:  Dg Chest Portable 1 View  Result Date: 07/12/2019 CLINICAL DATA:  Chest pain EXAM: PORTABLE CHEST 1 VIEW COMPARISON:  Chest radiograph 06/17/2019 FINDINGS: Single view AP erect radiograph. The heart is not enlarged. The lungs are clear. No evidence of pleural effusion or pneumothorax. The bones are unremarkable. IMPRESSION: Clear lungs. Electronically Signed   By: Marylyn Ishihara  Armandina Gemma   On: 07/12/2019 08:19    EKG:   Orders placed or performed during the hospital encounter of 07/12/19  . EKG 12-Lead  . EKG 12-Lead  . ED EKG  . ED EKG    IMPRESSION AND PLAN:    #Unstable angina with illicit drug use-patient is admitting cocaine and marijuana History of coronary artery disease status post stent placement Admit to telemetry Cycle cardiac biomarkers Oxygen to keep his pulse ox greater than 94% Morphine IV as needed  nitroglycerin and aspirin Echocardiogram Cardiology consult placed-to [facility] cardiology Patient is started on heparin drip Urine drug screen is pending Avoiding beta-blocker in view of cocaine use N.p.o. except for meds  #Hyponatremia-most likely alcohol-related IV fluids and recheck in a.m.  #Hypokalemia-replete  and recheck including magnesium  # borderline diabetes Patient claims that he is borderline diabetic We will check hemoglobin A1c, sliding scale insulin  #Alcohol abuse ciwa Multivitamin thiamine folate and IV fluids Outpatient alcohol Anonymous advised  #Tobacco abuse disorder counseled patient to quit smoking for 5 minutes.  He verbalized understanding but not ready to quit  #History of illicit drug abuse Urine drug screen is pending Patient claims that he is depressed and using street drugs, tobacco and alcohol to come out of it  DVT prophylaxis on heparin drip GI prophylaxis with Protonix  All the records are reviewed and case discussed with ED provider. Management plans discussed with the patient, family and they are in agreement.  CODE STATUS: fc  TOTAL TIME TAKING CARE OF THIS PATIENT: 45  minutes.   Note: This dictation was prepared with Dragon dictation along with smaller phrase technology. Any transcriptional errors that result from this process are unintentional.  Nicholes Mango M.D on 07/12/2019 at 11:51 AM  Between 7am to 6pm - Pager - [phone]  After 6pm go to [email or web address] - password [email or web address] [facility]  [place] [facility]  Office  [phone]  CC: Primary care physician; Medicine, [facility]

## 2019-07-12 NOTE — ED Provider Notes (Addendum)
[facility] Emergency Department Provider Note   ____________________________________________   First MD Initiated Contact with Patient 07/12/19 0740     (approximate)  I have reviewed the triage vital signs and the nursing notes.   HISTORY  Chief Complaint Chest Pain    HPI Ethan Wells is a 48 y.o. male who reports he did cocaine 3 days ago and since then has been having constant chest pain.  Pain is a heavy tightness.  It has been constant for 3 days.  Nothing seems to make it better or worse.  He has felt tired since then and really has not done much of anything.  He is not really short of breath.  He reports he has had heart attack 3 times and a stent.  He has not done cocaine since 3 days ago.  He does smoke cigarettes.   EMS reports they gave him 4 baby aspirin's.      Past Medical History:  Diagnosis Date  . CAD (coronary artery disease)    a. 01/2011 Anterior STEMI/Cath/PCI: LM nl, LAD 100d (3.5x4mm Vision BMS placed), LCX 53m, RI 50, RCA min irregs, EF 40% w/ apical, inferoapical HK.  . Cardiac arrest - ventricular fibrillation    a. In setting of STEMI 01/2011  . Cocaine abuse ([place])   . ETOH abuse    a. 6-12 beers/day  . Hemorrhoids   . Hypertension   . Ischemic cardiomyopathy    a. 06/2011 Echo: EF 45-50%, No rwma  . Marijuana abuse   . Tobacco abuse    a. 1/2 ppd x 26 yrs    Patient Active Problem List   Diagnosis Date Noted  . Chest pain 07/12/2019  . Abdominal pain, RUQ 05/25/2018  . Chest pain, mid sternal 11/17/2012  . Cocaine abuse ([place])   . Marijuana abuse   . TOBACCO ABUSE 02/26/2011  . HYPERTENSION, BENIGN 02/26/2011  . CAD, NATIVE VESSEL 02/26/2011  . Other specified forms of chronic ischemic heart disease 02/26/2011    History reviewed. No pertinent surgical history.  Prior to Admission medications   Medication Sig Start Date End Date Taking? Authorizing Provider  gabapentin (NEURONTIN) 100 MG capsule  Take 1 capsule (100 mg total) by mouth 3 (three) times daily. 06/23/19  Yes Domenic Moras, PA-C  omeprazole (PRILOSEC OTC) 20 MG tablet Take 1 tablet (20 mg total) by mouth daily. 06/17/19 06/16/20 Yes Hinda Kehr, MD  predniSONE (DELTASONE) 20 MG tablet 3 tabs po day one, then 2 tabs daily x 4 days 06/23/19  Yes Domenic Moras, PA-C  dicyclomine (BENTYL) 10 MG capsule Take 1 capsule (10 mg total) by mouth 3 (three) times daily as needed for up to 14 days for spasms. or abdominal pain 06/17/19 07/01/19  Hinda Kehr, MD  feeding supplement, ENSURE ENLIVE, (ENSURE ENLIVE) LIQD Take 237 mLs by mouth 2 (two) times daily between meals. 05/27/18   Salary, Avel Peace, MD  Multiple Vitamin (MULTIVITAMIN WITH MINERALS) TABS tablet Take 1 tablet by mouth daily. Patient not taking: Reported on 07/12/2019 05/28/18   Salary, Avel Peace, MD  omeprazole (PRILOSEC) 20 MG capsule Take 1 capsule (20 mg total) by mouth 2 (two) times daily. 04/19/18 04/19/19  Hinda Kehr, MD  ondansetron (ZOFRAN ODT) 4 MG disintegrating tablet Allow 1-2 tablets to dissolve in your mouth every 8 hours as needed for nausea/vomiting Patient not taking: Reported on 07/12/2019 06/17/19   Hinda Kehr, MD  sucralfate (CARAFATE) 1 g tablet Take 1 tablet (1 g  total) by mouth 4 (four) times daily as needed (for abdominal discomfort, nausea, and/or vomiting). Patient not taking: Reported on 07/12/2019 06/17/19   Hinda Kehr, MD  traMADol (ULTRAM) 50 MG tablet Take 1 tablet (50 mg total) by mouth every 6 (six) hours as needed. Patient not taking: Reported on 07/12/2019 07/26/18 07/26/19  Laban Emperor, PA-C    Allergies Penicillins  Family History  Problem Relation Age of Onset  . Coronary artery disease Mother        alive  . Other Other        2 sisters alive and well  . Alcohol abuse Father        died @ 25    Social History Social History   Tobacco Use  . Smoking status: Current Every Day Smoker    Packs/day: 0.50    Years: 26.00    Pack years:  13.00    Types: Cigarettes  . Smokeless tobacco: Never Used  Substance Use Topics  . Alcohol use: Yes    Comment: at least a 6 pack of beer daily and often a 12 pack  . Drug use: Yes    Types: Cocaine, Marijuana    Comment: denies cocaine; marijuana 2 days ago    Review of Systems  Constitutional: No fever/chills Eyes: No visual changes. ENT: No sore throat. Cardiovascular:  chest pain. Respiratory: Denies shortness of breath. Gastrointestinal: No abdominal pain.  No nausea, no vomiting.  No diarrhea.  No constipation. Genitourinary: Negative for dysuria. Musculoskeletal: Patient has a pinched nerve in his neck and is supposed to see orthopedic surgery for him 3 days.. Skin: Negative for rash. Neurological: Negative for headaches, focal weakness   ____________________________________________   PHYSICAL EXAM:  VITAL SIGNS: ED Triage Vitals  Enc Vitals Group     BP 07/12/19 0741 (!) 150/93     Pulse Rate 07/12/19 0741 (!) 109     Resp 07/12/19 0741 (!) 21     Temp --      Temp src --      SpO2 07/12/19 0741 99 %     Weight 07/12/19 0742 180 lb (81.6 kg)     Height 07/12/19 0742 5\' 5"  (1.651 m)     Head Circumference --      Peak Flow --      Pain Score 07/12/19 0742 8     Pain Loc --      Pain Edu? --      Excl. in [place]? --     Constitutional: Alert and oriented. Well appearing and in no acute distress. Eyes: Conjunctivae are normal.  Head: Atraumatic. Nose: No congestion/rhinnorhea. Mouth/Throat: Mucous membranes are moist.  Oropharynx non-erythematous. Neck: No stridor. Cardiovascular: Normal rate, regular rhythm. Grossly normal heart sounds.  Good peripheral circulation. Respiratory: Normal respiratory effort.  No retractions. Lungs CTAB. Gastrointestinal: Soft and nontender. No distention. No abdominal bruits. No CVA tenderness. Musculoskeletal: No lower extremity tenderness nor edema.  Neurologic:  Normal speech and language. No gross focal neurologic  deficits are appreciated.. Skin:  Skin is warm, dry and intact. No rash noted.  ____________________________________________   LABS (all labs ordered are listed, but only abnormal results are displayed)  Labs Reviewed  COMPREHENSIVE METABOLIC PANEL - Abnormal; Notable for the following components:      Result Value   Sodium 131 (*)    Potassium 3.0 (*)    Glucose, Bld 133 (*)    Creatinine, Ser 0.44 (*)    Calcium 8.6 (*)  AST 90 (*)    ALT 52 (*)    Total Bilirubin 3.1 (*)    All other components within normal limits  CBC WITH DIFFERENTIAL/PLATELET - Abnormal; Notable for the following components:   RBC 3.89 (*)    MCV 105.7 (*)    MCH 38.3 (*)    MCHC 36.3 (*)    All other components within normal limits  GLUCOSE, CAPILLARY - Abnormal; Notable for the following components:   Glucose-Capillary 139 (*)    All other components within normal limits  TROPONIN I (HIGH SENSITIVITY) - Abnormal; Notable for the following components:   Troponin I (High Sensitivity) 53 (*)    All other components within normal limits  TROPONIN I (HIGH SENSITIVITY) - Abnormal; Notable for the following components:   Troponin I (High Sensitivity) 56 (*)    All other components within normal limits  TROPONIN I (HIGH SENSITIVITY) - Abnormal; Notable for the following components:   Troponin I (High Sensitivity) 58 (*)    All other components within normal limits  SARS CORONAVIRUS 2 (HOSPITAL ORDER, [facility])  APTT  PROTIME-INR  HEPARIN LEVEL (UNFRACTIONATED)  URINE DRUG SCREEN, QUALITATIVE ([facility] ONLY)  HEMOGLOBIN A1C  HIV ANTIBODY (ROUTINE TESTING W REFLEX)  TSH   ____________________________________________  EKG  EKG read and interpreted by me shows sinus tachycardia rate of 105 normal axis nonspecific ST-T wave changes looks similar to EKGs done earlier this month. ____________________________________________  RADIOLOGY  ED MD interpretation:   Official  radiology report(s): Dg Chest Portable 1 View  Result Date: 07/12/2019 CLINICAL DATA:  Chest pain EXAM: PORTABLE CHEST 1 VIEW COMPARISON:  Chest radiograph 06/17/2019 FINDINGS: Single view AP erect radiograph. The heart is not enlarged. The lungs are clear. No evidence of pleural effusion or pneumothorax. The bones are unremarkable. IMPRESSION: Clear lungs. Electronically Signed   By: Kellie Simmering   On: 07/12/2019 08:19    ____________________________________________   PROCEDURES  Procedure(s) performed (including Critical Care): Medical care 35 minutes.  This involves reevaluation of pain looking at his studies discussing him with the hospitalist etc.  Procedures   ____________________________________________   INITIAL IMPRESSION / ASSESSMENT AND PLAN / ED COURSE Patient's troponin is elevated.  It is higher than it was beginning of the month.  He still having chest pain.  I will give him some more nitro and little bit of pain medicine will try and bring him in the hospital. [name] was evaluated in Emergency Department on 07/12/2019 for the symptoms described in the history of present illness. He was evaluated in the context of the global COVID-19 pandemic, which necessitated consideration that the patient might be at risk for infection with the SARS-CoV-2 virus that causes COVID-19. Institutional protocols and algorithms that pertain to the evaluation of patients at risk for COVID-19 are in a state of rapid change based on information released by regulatory bodies including the CDC and federal and state organizations. These policies and algorithms were followed during the patient's care in the ED.              ____________________________________________   FINAL CLINICAL IMPRESSION(S) / ED DIAGNOSES  Final diagnoses:  Chest pain, unspecified type  Elevated troponin     ED Discharge Orders    None       Note:  This document was prepared using Dragon voice  recognition software and may include unintentional dictation errors.    Nena Polio, MD 07/12/19 [phone]  Nena Polio, MD 07/27/19 [phone]

## 2019-07-12 NOTE — ED Triage Notes (Signed)
Patient presents to the ED via EMS for chest pain x 3 days.  Patient reports chest pain is so severe that he had vomiting yesterday.  Patient has history of 3 previous heart attacks.  Patient has history of IV drug use and reports using cocaine 3 days ago.  Patient describes pain as pressure.  EMS gave patient 1 nitro spray and 4 baby aspirin.

## 2019-07-12 NOTE — Progress Notes (Addendum)
ANTICOAGULATION CONSULT NOTE - Initial Consult  Pharmacy Consult for Heparin Indication: chest pain/ACS  Allergies  Allergen Reactions  . Penicillins Hives   Patient Measurements: Height: 5\' 5"  (165.1 cm) Weight: 180 lb (81.6 kg) IBW/kg (Calculated) : 61.5 Heparin Dosing Weight: 78.3Kg  Vital Signs: Temp: 97.9 F (36.6 C) (07/27 0750) Temp Source: Oral (07/27 0750) BP: 144/90 (07/27 0900) Pulse Rate: 101 (07/27 0900)  Labs: Recent Labs    07/12/19 0745  HGB 14.9  HCT 41.1  PLT 204  CREATININE 0.44*  TROPONINIHS 53*   Estimated Creatinine Clearance: 112.2 mL/min (A) (by C-G formula based on SCr of 0.44 mg/dL (L)).  Medical History: Past Medical History:  Diagnosis Date  . CAD (coronary artery disease)    a. 01/2011 Anterior STEMI/Cath/PCI: LM nl, LAD 100d (3.5x64mm Vision BMS placed), LCX 37m, RI 50, RCA min irregs, EF 40% w/ apical, inferoapical HK.  . Cardiac arrest - ventricular fibrillation    a. In setting of STEMI 01/2011  . Cocaine abuse ([place])   . ETOH abuse    a. 6-12 beers/day  . Hemorrhoids   . Hypertension   . Ischemic cardiomyopathy    a. 06/2011 Echo: EF 45-50%, No rwma  . Marijuana abuse   . Tobacco abuse    a. 1/2 ppd x 26 yrs   Medications:  (Not in a hospital admission)  Assessment: Patient presents to the ED via EMS for chest pain x 3 days.  Patient reports chest pain is so severe that he had vomiting yesterday.  Patient has history of 3 previous heart attacks.  Patient has history of IV drug use and reports using cocaine 3 days ago.  Patient describes pain as pressure.  EMS gave patient 1 nitro spray and 4 baby aspirin.    Goal of Therapy:  Heparin level 0.3-0.7 units/ml Monitor platelets by anticoagulation protocol: Yes   Plan:  Heparin 4000 units IV bolus now x 1 Heparin infusion at 950 units/hr Check Heparin level in 6-8 hours then daily Monitor labs, platelets, s/sx of bleeding complications  [place] Crane, Stephana Morell A 07/12/2019,9:50 AM

## 2019-07-13 ENCOUNTER — Observation Stay (HOSPITAL_COMMUNITY)
Admit: 2019-07-13 | Discharge: 2019-07-13 | Disposition: A | Discharge status: Home / Self Care | Payer: Medicare HMO | Attending: Internal Medicine | Admitting: Internal Medicine

## 2019-07-13 DIAGNOSIS — I25110 Atherosclerotic heart disease of native coronary artery with unstable angina pectoris: Secondary | ICD-10-CM | POA: Diagnosis present

## 2019-07-13 DIAGNOSIS — E876 Hypokalemia: Secondary | ICD-10-CM | POA: Diagnosis present

## 2019-07-13 DIAGNOSIS — Z8249 Family history of ischemic heart disease and other diseases of the circulatory system: Secondary | ICD-10-CM | POA: Diagnosis not present

## 2019-07-13 DIAGNOSIS — R440 Auditory hallucinations: Secondary | ICD-10-CM | POA: Diagnosis not present

## 2019-07-13 DIAGNOSIS — F1910 Other psychoactive substance abuse, uncomplicated: Secondary | ICD-10-CM | POA: Diagnosis not present

## 2019-07-13 DIAGNOSIS — R079 Chest pain, unspecified: Secondary | ICD-10-CM | POA: Diagnosis not present

## 2019-07-13 DIAGNOSIS — R441 Visual hallucinations: Secondary | ICD-10-CM | POA: Diagnosis not present

## 2019-07-13 DIAGNOSIS — I252 Old myocardial infarction: Secondary | ICD-10-CM | POA: Diagnosis not present

## 2019-07-13 DIAGNOSIS — F1210 Cannabis abuse, uncomplicated: Secondary | ICD-10-CM | POA: Diagnosis present

## 2019-07-13 DIAGNOSIS — I255 Ischemic cardiomyopathy: Secondary | ICD-10-CM | POA: Diagnosis present

## 2019-07-13 DIAGNOSIS — F17210 Nicotine dependence, cigarettes, uncomplicated: Secondary | ICD-10-CM | POA: Diagnosis present

## 2019-07-13 DIAGNOSIS — R000 Tachycardia, unspecified: Secondary | ICD-10-CM | POA: Diagnosis present

## 2019-07-13 DIAGNOSIS — R7989 Other specified abnormal findings of blood chemistry: Secondary | ICD-10-CM | POA: Diagnosis not present

## 2019-07-13 DIAGNOSIS — Z9119 Patient's noncompliance with other medical treatment and regimen: Secondary | ICD-10-CM | POA: Diagnosis not present

## 2019-07-13 DIAGNOSIS — Z955 Presence of coronary angioplasty implant and graft: Secondary | ICD-10-CM | POA: Diagnosis not present

## 2019-07-13 DIAGNOSIS — R7303 Prediabetes: Secondary | ICD-10-CM | POA: Diagnosis present

## 2019-07-13 DIAGNOSIS — Z880 Allergy status to penicillin: Secondary | ICD-10-CM | POA: Diagnosis not present

## 2019-07-13 DIAGNOSIS — R451 Restlessness and agitation: Secondary | ICD-10-CM | POA: Diagnosis not present

## 2019-07-13 DIAGNOSIS — Z811 Family history of alcohol abuse and dependence: Secondary | ICD-10-CM | POA: Diagnosis not present

## 2019-07-13 DIAGNOSIS — F329 Major depressive disorder, single episode, unspecified: Secondary | ICD-10-CM | POA: Diagnosis present

## 2019-07-13 DIAGNOSIS — Z8674 Personal history of sudden cardiac arrest: Secondary | ICD-10-CM | POA: Diagnosis not present

## 2019-07-13 DIAGNOSIS — F1010 Alcohol abuse, uncomplicated: Secondary | ICD-10-CM | POA: Diagnosis present

## 2019-07-13 DIAGNOSIS — Z79899 Other long term (current) drug therapy: Secondary | ICD-10-CM | POA: Diagnosis not present

## 2019-07-13 DIAGNOSIS — I10 Essential (primary) hypertension: Secondary | ICD-10-CM | POA: Diagnosis present

## 2019-07-13 DIAGNOSIS — E871 Hypo-osmolality and hyponatremia: Secondary | ICD-10-CM | POA: Diagnosis present

## 2019-07-13 DIAGNOSIS — F1410 Cocaine abuse, uncomplicated: Secondary | ICD-10-CM | POA: Diagnosis present

## 2019-07-13 DIAGNOSIS — Z20828 Contact with and (suspected) exposure to other viral communicable diseases: Secondary | ICD-10-CM | POA: Diagnosis present

## 2019-07-13 LAB — GLUCOSE, CAPILLARY
Glucose-Capillary: 110 mg/dL — ABNORMAL HIGH (ref 70–99)
Glucose-Capillary: 90 mg/dL (ref 70–99)
Glucose-Capillary: 117 mg/dL — ABNORMAL HIGH (ref 70–99)
Glucose-Capillary: 96 mg/dL (ref 70–99)
Glucose-Capillary: 98 mg/dL (ref 70–99)

## 2019-07-13 LAB — HEPARIN LEVEL (UNFRACTIONATED)
Heparin Unfractionated: 0.34 IU/mL (ref 0.30–0.70)
Heparin Unfractionated: 0.23 IU/mL — ABNORMAL LOW (ref 0.30–0.70)
Heparin Unfractionated: 0.29 IU/mL — ABNORMAL LOW (ref 0.30–0.70)
Heparin Unfractionated: 0.36 IU/mL (ref 0.30–0.70)

## 2019-07-13 LAB — BASIC METABOLIC PANEL
Anion gap: 10 (ref 5–15)
BUN: 8 mg/dL (ref 6–20)
CO2: 22 mmol/L (ref 22–32)
Calcium: 8.5 mg/dL — ABNORMAL LOW (ref 8.9–10.3)
Chloride: 102 mmol/L (ref 98–111)
Creatinine, Ser: 0.47 mg/dL — ABNORMAL LOW (ref 0.61–1.24)
GFR calc Af Amer: >60 mL/min (ref >60)
GFR calc non Af Amer: >60 mL/min (ref >60)
Glucose, Bld: 113 mg/dL — ABNORMAL HIGH (ref 70–99)
Potassium: 3.3 mmol/L — ABNORMAL LOW (ref 3.5–5.1)
Sodium: 134 mmol/L — ABNORMAL LOW (ref 135–145)

## 2019-07-13 LAB — CBC
HCT: 40.8 % (ref 39.0–52.0)
Hemoglobin: 14.5 g/dL (ref 13.0–17.0)
MCH: 38.4 pg — ABNORMAL HIGH (ref 26.0–34.0)
MCHC: 35.5 g/dL (ref 30.0–36.0)
MCV: 107.9 fL — ABNORMAL HIGH (ref 80.0–100.0)
Platelets: 208 10*3/uL (ref 150–400)
RBC: 3.78 MIL/uL — ABNORMAL LOW (ref 4.22–5.81)
RDW: 12.9 % (ref 11.5–15.5)
WBC: 8.9 10*3/uL (ref 4.0–10.5)
nRBC: 0 % (ref 0.0–0.2)

## 2019-07-13 LAB — LIPID PANEL
Cholesterol: 193 mg/dL (ref 0–200)
HDL: 53 mg/dL (ref >40)
LDL Cholesterol: 118 mg/dL — ABNORMAL HIGH (ref 0–99)
Total CHOL/HDL Ratio: 3.6 RATIO
Triglycerides: 109 mg/dL (ref <150)
VLDL: 22 mg/dL (ref 0–40)

## 2019-07-13 LAB — ECHOCARDIOGRAM COMPLETE
Height: 65 in
Weight: 2913.6 oz

## 2019-07-13 LAB — HEMOGLOBIN A1C
Hgb A1c MFr Bld: 5.4 % (ref 4.8–5.6)
Mean Plasma Glucose: 108.28 mg/dL

## 2019-07-13 LAB — MAGNESIUM: Magnesium: 1.8 mg/dL (ref 1.7–2.4)

## 2019-07-13 LAB — HIV ANTIBODY (ROUTINE TESTING W REFLEX): HIV Screen 4th Generation wRfx: NONREACTIVE

## 2019-07-13 MED ORDER — ISOSORBIDE MONONITRATE ER 30 MG PO TB24
30.0000 mg | ORAL_TABLET | Freq: Every day | ORAL | Status: DC
  Administered 2019-07-13 – 2019-07-15 (×3): 30 mg via ORAL
  Filled 2019-07-13 (×3): qty 1

## 2019-07-13 MED ORDER — LISINOPRIL 5 MG PO TABS
2.5000 mg | ORAL_TABLET | Freq: Every day | ORAL | Status: DC
  Administered 2019-07-13 – 2019-07-15 (×3): 2.5 mg via ORAL
  Filled 2019-07-13 (×3): qty 1

## 2019-07-13 MED ORDER — ACETAMINOPHEN 325 MG PO TABS
650.0000 mg | ORAL_TABLET | ORAL | Status: DC | PRN
  Administered 2019-07-13: 650 mg via ORAL
  Filled 2019-07-13: qty 2

## 2019-07-13 NOTE — Progress Notes (Signed)
*  PRELIMINARY RESULTS* Echocardiogram 2D Echocardiogram has been performed.  Ethan Wells 07/13/2019, 9:51 AM

## 2019-07-13 NOTE — Progress Notes (Signed)
[place] at [facility]                                                                                                                                                                                  Patient Demographics   Ethan Wells, is a 48 y.o. male, DOB - 1970/12/25, ZOX:[id number]  Admit date - 07/12/2019   Admitting Physician Nicholes Mango, MD  Outpatient Primary MD for the patient is [facility]   LOS - 0  Subjective:  Patient complains of chest pain overnight required Ativan for tremulousness and agitation   Review of Systems:   CONSTITUTIONAL: No documented fever. No fatigue, weakness. No weight gain, no weight loss.  EYES: No blurry or double vision.  ENT: No tinnitus. No postnasal drip. No redness of the oropharynx.  RESPIRATORY: No cough, no wheeze, no hemoptysis. No dyspnea.  CARDIOVASCULAR: Positive chest pain. No orthopnea. No palpitations. No syncope.  GASTROINTESTINAL: No nausea, no vomiting or diarrhea. No abdominal pain. No melena or hematochezia.  GENITOURINARY: No dysuria or hematuria.  ENDOCRINE: No polyuria or nocturia. No heat or cold intolerance.  HEMATOLOGY: No anemia. No bruising. No bleeding.  INTEGUMENTARY: No rashes. No lesions.  MUSCULOSKELETAL: No arthritis. No swelling. No gout.  NEUROLOGIC: No numbness, tingling, or ataxia. No seizure-type activity.  PSYCHIATRIC: Positive anxiety. No insomnia. No ADD.    Vitals:   Vitals:   07/12/19 1401 07/12/19 1643 07/13/19 0535 07/13/19 0832  BP:  127/85 107/72 124/81  Pulse:  (!) 113 97 (!) 103  Resp:  (!) 23 20 18   Temp:  98.2 F (36.8 C) 98.3 F (36.8 C) 98.1 F (36.7 C)  TempSrc:  Oral  Oral  SpO2:  98% 97% 98%  Weight: 83 kg  82.6 kg   Height: 5\' 5"  (1.651 m)       Wt Readings from Last 3 Encounters:  07/13/19 82.6 kg  06/23/19 81.6 kg  06/17/19 81.6 kg     Intake/Output Summary (Last 24 hours) at 07/13/2019 1441 Last data filed at  07/13/2019 1138 Gross per 24 hour  Intake 237 ml  Output 925 ml  Net -688 ml    Physical Exam:   GENERAL: Pleasant-appearing in no apparent distress.  HEAD, EYES, EARS, NOSE AND THROAT: Atraumatic, normocephalic. Extraocular muscles are intact. Pupils equal and reactive to light. Sclerae anicteric. No conjunctival injection. No oro-pharyngeal erythema.  NECK: Supple. There is no jugular venous distention. No bruits, no lymphadenopathy, no thyromegaly.  HEART: Regular rate and rhythm,. No murmurs, no rubs, no clicks.  LUNGS: Clear to auscultation bilaterally. No rales or rhonchi. No wheezes.  ABDOMEN: Soft, flat, nontender, nondistended. Has good  bowel sounds. No hepatosplenomegaly appreciated.  EXTREMITIES: No evidence of any cyanosis, clubbing, or peripheral edema.  +2 pedal and radial pulses bilaterally.  NEUROLOGIC: The patient is alert, awake, and oriented x3 with no focal motor or sensory deficits appreciated bilaterally.  SKIN: Moist and warm with no rashes appreciated.  Psych: Tremulous LN: No inguinal LN enlargement    Antibiotics   Anti-infectives (From admission, onward)   None      Medications   Scheduled Meds: . aspirin EC  81 mg Oral Daily  . carvedilol  6.25 mg Oral BID WC  . folic acid  1 mg Oral Daily  . gabapentin  100 mg Oral TID  . insulin aspart  0-5 Units Subcutaneous QHS  . insulin aspart  0-9 Units Subcutaneous TID WC  . isosorbide mononitrate  30 mg Oral Daily  . lisinopril  2.5 mg Oral Daily  . multivitamin with minerals  1 tablet Oral Daily  . nicotine  21 mg Transdermal Daily  . pantoprazole  40 mg Oral Daily  . thiamine  100 mg Oral Daily   Or  . thiamine  100 mg Intravenous Daily   Continuous Infusions: . heparin 1,350 Units/hr (07/13/19 0929)   PRN Meds:.acetaminophen, dicyclomine, LORazepam **OR** LORazepam, morphine injection, nitroGLYCERIN, ondansetron (ZOFRAN) IV   Data Review:   Micro Results Recent Results (from the past 240  hour(s))  SARS Coronavirus 2 (CEPHEID- Performed in [place] hospital lab), Hosp Order     Status: None   Collection Time: 07/12/19  9:55 AM   Specimen: Nasopharyngeal Swab  Result Value Ref Range Status   SARS Coronavirus 2 NEGATIVE NEGATIVE Final    Comment: (NOTE) If result is NEGATIVE SARS-CoV-2 target nucleic acids are NOT DETECTED. The SARS-CoV-2 RNA is generally detectable in upper and lower  respiratory specimens during the acute phase of infection. The lowest  concentration of SARS-CoV-2 viral copies this assay can detect is 250  copies / mL. A negative result does not preclude SARS-CoV-2 infection  and should not be used as the sole basis for treatment or other  patient management decisions.  A negative result may occur with  improper specimen collection / handling, submission of specimen other  than nasopharyngeal swab, presence of viral mutation(s) within the  areas targeted by this assay, and inadequate number of viral copies  (<250 copies / mL). A negative result must be combined with clinical  observations, patient history, and epidemiological information. If result is POSITIVE SARS-CoV-2 target nucleic acids are DETECTED. The SARS-CoV-2 RNA is generally detectable in upper and lower  respiratory specimens dur ing the acute phase of infection.  Positive  results are indicative of active infection with SARS-CoV-2.  Clinical  correlation with patient history and other diagnostic information is  necessary to determine patient infection status.  Positive results do  not rule out bacterial infection or co-infection with other viruses. If result is PRESUMPTIVE POSTIVE SARS-CoV-2 nucleic acids MAY BE PRESENT.   A presumptive positive result was obtained on the submitted specimen  and confirmed on repeat testing.  While 2019 novel coronavirus  (SARS-CoV-2) nucleic acids may be present in the submitted sample  additional confirmatory testing may be necessary for  epidemiological  and / or clinical management purposes  to differentiate between  SARS-CoV-2 and other Sarbecovirus currently known to infect humans.  If clinically indicated additional testing with an alternate test  methodology [phone]) is advised. The SARS-CoV-2 RNA is generally  detectable in upper and lower respiratory sp  ecimens during the acute  phase of infection. The expected result is Negative. Fact Sheet for Patients:  StrictlyIdeas.no Fact Sheet for Healthcare Providers: BankingDealers.co.za This test is not yet approved or cleared by the [place] FDA and has been authorized for detection and/or diagnosis of SARS-CoV-2 by FDA under an Emergency Use Authorization (EUA).  This EUA will remain in effect (meaning this test can be used) for the duration of the COVID-19 declaration under Section 564(b)(1) of the Act, 21 U.S.C. section 360bbb-3(b)(1), unless the authorization is terminated or revoked sooner. Performed at [facility], [address], [place], [place] [address]     Radiology Reports Dg Chest 2 View  Result Date: 06/17/2019 CLINICAL DATA:  Shortness of breath EXAM: CHEST - 2 VIEW COMPARISON:  05/25/2018 FINDINGS: Normal heart size and mediastinal contours. No acute infiltrate or edema. No effusion or pneumothorax. No acute osseous findings. IMPRESSION: Negative chest. Electronically Signed   By: Monte Fantasia M.D.   On: 06/17/2019 05:50   Dg Chest Portable 1 View  Result Date: 07/12/2019 CLINICAL DATA:  Chest pain EXAM: PORTABLE CHEST 1 VIEW COMPARISON:  Chest radiograph 06/17/2019 FINDINGS: Single view AP erect radiograph. The heart is not enlarged. The lungs are clear. No evidence of pleural effusion or pneumothorax. The bones are unremarkable. IMPRESSION: Clear lungs. Electronically Signed   By: Kellie Simmering   On: 07/12/2019 08:19   Dg Shoulder Left Portable  Result Date: 06/17/2019 CLINICAL DATA:   Left shoulder pain EXAM: LEFT SHOULDER - 1 VIEW COMPARISON:  03/07/2011 FINDINGS: Chronic Hill-Sachs deformity. The glenohumeral joint and acromioclavicular joint are located. Mild AC joint spurring. IMPRESSION: 1. No acute finding. 2. Chronic Hill-Sachs deformity. Electronically Signed   By: Monte Fantasia M.D.   On: 06/17/2019 06:35     CBC Recent Labs  Lab 07/12/19 0745 07/13/19 0213  WBC 8.2 8.9  HGB 14.9 14.5  HCT 41.1 40.8  PLT 204 208  MCV 105.7* 107.9*  MCH 38.3* 38.4*  MCHC 36.3* 35.5  RDW 12.7 12.9  LYMPHSABS 1.6  --   MONOABS 0.6  --   EOSABS 0.1  --   BASOSABS 0.0  --     Chemistries  Recent Labs  Lab 07/12/19 0745 07/13/19 0213  NA 131* 134*  K 3.0* 3.3*  CL 99 102  CO2 22 22  GLUCOSE 133* 113*  BUN 8 8  CREATININE 0.44* 0.47*  CALCIUM 8.6* 8.5*  MG  --  1.8  AST 90*  --   ALT 52*  --   ALKPHOS 119  --   BILITOT 3.1*  --    ------------------------------------------------------------------------------------------------------------------ estimated creatinine clearance is 112.9 mL/min (A) (by C-G formula based on SCr of 0.47 mg/dL (L)). ------------------------------------------------------------------------------------------------------------------ Recent Labs    07/12/19 1154 07/13/19 0213  HGBA1C 5.4 5.4   ------------------------------------------------------------------------------------------------------------------ Recent Labs    07/13/19 0213  CHOL 193  HDL 53  LDLCALC 118*  TRIG 109  CHOLHDL 3.6   ------------------------------------------------------------------------------------------------------------------ Recent Labs    07/12/19 1755  TSH 1.572   ------------------------------------------------------------------------------------------------------------------ No results for input(s): VITAMINB12, FOLATE, FERRITIN, TIBC, IRON, RETICCTPCT in the last 72 hours.  Coagulation profile Recent Labs  Lab 07/12/19 0956  INR 1.0     No results for input(s): DDIMER in the last 72 hours.  Cardiac Enzymes No results for input(s): CKMB, TROPONINI, MYOGLOBIN in the last 168 hours.  Invalid input(s): CK ------------------------------------------------------------------------------------------------------------------ Invalid input(s): [place]   #Unstable angina with illicit drug  use-patient is admitting cocaine and marijuana Appreciate cardiology input Echocardiogram shows reduction in ejection fraction Cardiology recommends medical management Continue isosorbide mononitrate, aspirin, carvedilol, lisinopril and IV heparin  #Hyponatremia-most likely alcohol-related Improved with IV fluid  #Hypokalemia-continue replacing  # borderline diabetes Hemoglobin A1c is 5.4 no need for treatment  #Alcohol abuse Continue [place] Multivitamin thiamine folate and IV fluids Outpatient alcohol Anonymous   #Tobacco abuse disorder counseled patient to quit smoking for 5 minutes.  He verbalized understanding but not ready to quit  #History of illicit drug abuse      Code Status Orders  (From admission, onward)         Start     Ordered   07/12/19 1246  Full code  Continuous     07/12/19 1245        Code Status History    Date Active Date Inactive Code Status Order ID Comments User Context   05/25/2018 0819 05/27/2018 1553 Full Code [id number]  Arta Silence, MD Inpatient   Advance Care Planning Activity           Consults cardiology  DVT Prophylaxis continue heparin  Lab Results  Component Value Date   PLT 208 07/13/2019     Time Spent in minutes 35 minutes spent greater than 50% of time spent in care coordination and counseling patient regarding the condition and plan of care.   Dustin Flock M.D on 07/13/2019 at 2:41 PM  Between 7am to 6pm - Pager - [phone]  After 6pm go to [email or web address] - Proofreader  [employer]   Office   [phone]

## 2019-07-13 NOTE — Progress Notes (Signed)
ANTICOAGULATION CONSULT NOTE - Initial Consult  Pharmacy Consult for Heparin Indication: chest pain/ACS  Allergies  Allergen Reactions  . Penicillins Hives   Patient Measurements: Height: 5\' 5"  (165.1 cm) Weight: 183 lb (83 kg) IBW/kg (Calculated) : 61.5 Heparin Dosing Weight: 78.3Kg  Vital Signs: Temp: 98.2 F (36.8 C) (07/27 1643) Temp Source: Oral (07/27 1643) BP: 127/85 (07/27 1643) Pulse Rate: 113 (07/27 1643)  Labs: Recent Labs    07/12/19 0745 07/12/19 0956 07/12/19 1138 07/12/19 1406 07/12/19 1755 07/13/19 0213  HGB 14.9  --   --   --   --  14.5  HCT 41.1  --   --   --   --  40.8  PLT 204  --   --   --   --  208  APTT  --  28  --   --   --   --   LABPROT  --  12.7  --   --   --   --   INR  --  1.0  --   --   --   --   HEPARINUNFRC  --   --   --   --  <0.10* 0.34  CREATININE 0.44*  --   --   --   --  0.47*  TROPONINIHS 53*  --  56* 58*  --   --    Estimated Creatinine Clearance: 113.2 mL/min (A) (by C-G formula based on SCr of 0.47 mg/dL (L)).  Medical History: Past Medical History:  Diagnosis Date  . CAD (coronary artery disease)    a. 01/2011 Anterior STEMI/Cath/PCI: LM nl, LAD 100d (3.5x64mm Vision BMS placed), LCX 6m, RI 50, RCA min irregs, EF 40% w/ apical, inferoapical HK.  . Cardiac arrest - ventricular fibrillation    a. In setting of STEMI 01/2011  . Cocaine abuse ([place])   . ETOH abuse    a. 6-12 beers/day  . Hemorrhoids   . Hypertension   . Ischemic cardiomyopathy    a. 06/2011 Echo: EF 45-50%, No rwma  . Marijuana abuse   . Tobacco abuse    a. 1/2 ppd x 26 yrs   Medications:  Medications Prior to Admission  Medication Sig Dispense Refill Last Dose  . gabapentin (NEURONTIN) 100 MG capsule Take 1 capsule (100 mg total) by mouth 3 (three) times daily. 60 capsule 0 Past Week at Unknown time  . omeprazole (PRILOSEC OTC) 20 MG tablet Take 1 tablet (20 mg total) by mouth daily. 28 tablet 1 Past Week at Unknown time  . predniSONE (DELTASONE) 20  MG tablet 3 tabs po day one, then 2 tabs daily x 4 days 11 tablet 0 Past Week at Unknown time  . dicyclomine (BENTYL) 10 MG capsule Take 1 capsule (10 mg total) by mouth 3 (three) times daily as needed for up to 14 days for spasms. or abdominal pain 30 capsule 0   . feeding supplement, ENSURE ENLIVE, (ENSURE ENLIVE) LIQD Take 237 mLs by mouth 2 (two) times daily between meals. 90 Bottle 0   . Multiple Vitamin (MULTIVITAMIN WITH MINERALS) TABS tablet Take 1 tablet by mouth daily. (Patient not taking: Reported on 07/12/2019) 180 tablet 0 Not Taking at Unknown time  . omeprazole (PRILOSEC) 20 MG capsule Take 1 capsule (20 mg total) by mouth 2 (two) times daily. 60 capsule 1   . ondansetron (ZOFRAN ODT) 4 MG disintegrating tablet Allow 1-2 tablets to dissolve in your mouth every 8 hours as needed for nausea/vomiting (Patient  not taking: Reported on 07/12/2019) 30 tablet 0 Completed Course at Unknown time  . sucralfate (CARAFATE) 1 g tablet Take 1 tablet (1 g total) by mouth 4 (four) times daily as needed (for abdominal discomfort, nausea, and/or vomiting). (Patient not taking: Reported on 07/12/2019) 30 tablet 1 Not Taking at Unknown time  . traMADol (ULTRAM) 50 MG tablet Take 1 tablet (50 mg total) by mouth every 6 (six) hours as needed. (Patient not taking: Reported on 07/12/2019) 6 tablet 0 Completed Course at Unknown time   Assessment: Patient presents to the ED via EMS for chest pain x 3 days.  Patient reports chest pain is so severe that he had vomiting yesterday.  Patient has history of 3 previous heart attacks.  Patient has history of IV drug use and reports using cocaine 3 days ago.  Patient describes pain as pressure.  EMS gave patient 1 nitro spray and 4 baby aspirin.    7/27 HL@1755 : <0.10. Called and asked nurse if line was stopped for any reason. Nurse stated that patient did pull IV out and began chewing line but at most it was stopped for 10 minutes.  Goal of Therapy:  Heparin level 0.3-0.7  units/ml Monitor platelets by anticoagulation protocol: Yes   Plan:  07/28 @ 0200 HL 0.34 therapeutic. Will continue current rate and will recheck HL @ 0800, CBC stable will continue to monitor.  Tobie Lords, PharmD, BCPS Clinical Pharmacist 07/13/2019,3:07 AM

## 2019-07-13 NOTE — Progress Notes (Signed)
Progress Note  Patient Name: Ethan Wells Date of Encounter: 07/13/2019  Primary Cardiologist: Dr. Angelena Form  Subjective   He overall feels worse today than he did yesterday.  Reported current chest pain, rated 5/6 in severity and centrally located along his sternum.  Pain is nonpleuritic and nonradiating.  Also reported associated shortness of breath.  No feelings of racing heart rate or palpitations.   Inpatient Medications    Scheduled Meds:  aspirin EC  81 mg Oral Daily   atorvastatin  40 mg Oral q1800   carvedilol  6.25 mg Oral BID WC   folic acid  1 mg Oral Daily   gabapentin  100 mg Oral TID   insulin aspart  0-5 Units Subcutaneous QHS   insulin aspart  0-9 Units Subcutaneous TID WC   multivitamin with minerals  1 tablet Oral Daily   nicotine  21 mg Transdermal Daily   pantoprazole  40 mg Oral Daily   thiamine  100 mg Oral Daily   Or   thiamine  100 mg Intravenous Daily   Continuous Infusions:  0.9 % NaCl with KCl 20 mEq / L 100 mL/hr at 07/13/19 0005   heparin 12 Units/hr (07/13/19 0310)   PRN Meds: acetaminophen, dicyclomine, LORazepam **OR** LORazepam, morphine injection, nitroGLYCERIN, ondansetron (ZOFRAN) IV   Vital Signs    Vitals:   07/12/19 1359 07/12/19 1401 07/12/19 1643 07/13/19 0535  BP: (!) 140/94  127/85 107/72  Pulse: 99  (!) 113 97  Resp: (!) 21  (!) 23 20  Temp: (!) 97.5 F (36.4 C)  98.2 F (36.8 C) 98.3 F (36.8 C)  TempSrc: Oral  Oral   SpO2: 98%  98% 97%  Weight:  83 kg  82.6 kg  Height:  5\' 5"  (1.651 m)      Intake/Output Summary (Last 24 hours) at 07/13/2019 0816 Last data filed at 07/12/2019 2305 Gross per 24 hour  Intake 237 ml  Output 925 ml  Net -688 ml   Last 3 Weights 07/13/2019 07/12/2019 07/12/2019  Weight (lbs) 182 lb 1.6 oz 183 lb 180 lb  Weight (kg) 82.6 kg 83.008 kg 81.647 kg      Telemetry    ST, 90-120s, rare ectopy- Personally Reviewed  ECG    Updated EKG Normal sinus rhythm, 96 bpm,  nonspecific ST and T wave abnormality, borderline repolarization abnormality with QRS 84 ms, prolonged QTC 490 ms, poor r wave progression - Personally Reviewed  Physical Exam   GEN: No acute distress. Somnolent after receiving several doses of Ativan.  No current hallucinations.  Neck: No JVD Cardiac: RRR, no murmurs, rubs, or gallops.  Respiratory: Clear to auscultation bilaterally with poor inspiratory effort. GI: Soft, nontender, non-distended  MS: No b/l lower extremity edema; No deformity. Neuro:  Nonfocal. Not responding well to loud auditory or bright visual stimulation this AM, somnolent.  Psych: Normal affect - no current hallucinations.  Labs    High Sensitivity Troponin:   Recent Labs  Lab 06/17/19 0030 06/17/19 0647 07/12/19 0745 07/12/19 1138 07/12/19 1406  TROPONINIHS 7 8 53* 56* 58*      Cardiac EnzymesNo results for input(s): TROPONINI in the last 168 hours. No results for input(s): TROPIPOC in the last 168 hours.   Chemistry Recent Labs  Lab 07/12/19 0745 07/13/19 0213  NA 131* 134*  K 3.0* 3.3*  CL 99 102  CO2 22 22  GLUCOSE 133* 113*  BUN 8 8  CREATININE 0.44* 0.47*  CALCIUM 8.6* 8.5*  PROT 7.5  --   ALBUMIN 3.7  --   AST 90*  --   ALT 52*  --   ALKPHOS 119  --   BILITOT 3.1*  --   GFRNONAA >60 >60  GFRAA >60 >60  ANIONGAP 10 10     Hematology Recent Labs  Lab 07/12/19 0745 07/13/19 0213  WBC 8.2 8.9  RBC 3.89* 3.78*  HGB 14.9 14.5  HCT 41.1 40.8  MCV 105.7* 107.9*  MCH 38.3* 38.4*  MCHC 36.3* 35.5  RDW 12.7 12.9  PLT 204 208    BNPNo results for input(s): BNP, PROBNP in the last 168 hours.   DDimer No results for input(s): DDIMER in the last 168 hours.   Radiology    Dg Chest Portable 1 View  Result Date: 07/12/2019 CLINICAL DATA:  Chest pain EXAM: PORTABLE CHEST 1 VIEW COMPARISON:  Chest radiograph 06/17/2019 FINDINGS: Single view AP erect radiograph. The heart is not enlarged. The lungs are clear. No evidence of  pleural effusion or pneumothorax. The bones are unremarkable. IMPRESSION: Clear lungs. Electronically Signed   By: Kellie Simmering   On: 07/12/2019 08:19    Cardiac Studies   Pending updated echo  07/02/2011 Echo Left ventricle: The cavity size was normal. Wall thickness was  normal. Systolic function was mildly reduced. The estimated ejection  fraction was in the range of 45% to 50%. Wall motion was normal;  there were no regional wall motion abnormalities. Left ventricular  diastolic function parameters were normal.    Patient Profile     48 y.o. male with a hx of CAD, ischemic cardiomyopathy, acute anterior wall STEMI in 2012 s/p BMS to LAD, ongoing and current tobacco/cocaine/alcohol/ marijuana abuse, h/o medication noncompliance, h/o depression, and who is being seen today for the evaluation of chest pain.  Assessment & Plan    Atypical chest pain in the setting of cocaine, amphetamine, THC use History of CAD s/p LAD stenting (2012), ICM --Patient continues to report atypical chest pain this morning, rated 5/6 in severity.  Yesterday's chest pain was reported as 10/10 in severity with UA showing amphetamines/cocaine/THC. Today, he feels worse than yesterday with staff reporting agitation overnight despite ativan.  --CAD history s/p 2012 acute anterior wall STEMI with bare-metal stent placement to LAD.  Stress test in 2013 ruled low risk. Lost to follow-up in 2014 with no current cardiac medications. - Initial EKG without acute ST/T changes.  Repeat EKG without acute ST/T changes as above, prolonged QTC. Hs Tn minimally elevated and flat trending in the setting of stimulant use with corresponding elevated HR and BP at presentation and thought most likely d/t supply demand ischemia. Nevertheless, patient does have a history of CAD/ICM and would benefit from further ischemic work-up once stable with stress test. -Echocardiogram pending given history of stenting, ICM. For now, defer further  ischemic workup or complete as an outpatient and dependent on EF / structural findings of echo and mental status.  --Continue medical management for now with ASA, Coreg, and lisinopril.  Continue to recommend holding statin medication given elevated liver function.  Prolonged QTc --Continue to monitor. Caution with QT prolonging medications.  Hypokalemia --K 3.3. Replete with goal 4.0. Check Mg. Daily BMET.  HTN --Improving. Continue medical management and titrate to control HR/BP.  Sinus tachycardia --Improving. Continue medical management.  Current multi-drug and alcohol abuse -UA tox screen with positive for amphetamines, cocaine, marijuana at admission.  Patient also reports alcohol and tobacco use.   --Suspect current  drug and alcohol abuse 6contributing to ongoing agitation and hallucinations.  Hallucinations, visual and auditory --Given active hallucinations yesterday, plan for stress test today was deferred. Spoke with overnight nursing staff early this morning (7/28) to assess if further hallucinations and his mental status.  Per overnight staff, patient has continued to have hallucinations throughout the night.  He was agitated throughout the night and received several doses of Ativan until falling asleep shortly before my assessment. --Unable to consent to procedures given hallucinations.  Medication non-compliance --Will need to stress compliance with treatment and follow-up again before discharge.  For questions or updates, please contact [place] Please consult [email or web address] for contact info under        Signed, Arvil Chaco, PA-C  07/13/2019, 8:16 AM

## 2019-07-13 NOTE — Progress Notes (Signed)
ANTICOAGULATION CONSULT NOTE - Initial Consult  Pharmacy Consult for Heparin Indication: chest pain/ACS  Allergies  Allergen Reactions  . Penicillins Hives   Patient Measurements: Height: 5\' 5"  (165.1 cm) Weight: 182 lb 1.6 oz (82.6 kg) IBW/kg (Calculated) : 61.5 Heparin Dosing Weight: 78.3Kg  Vital Signs: Temp: 97.9 F (36.6 C) (07/28 1943) Temp Source: Oral (07/28 1943) BP: 117/77 (07/28 1943) Pulse Rate: 91 (07/28 1943)  Labs: Recent Labs    07/12/19 0745 07/12/19 0956 07/12/19 1138 07/12/19 1406  07/13/19 0213 07/13/19 0755 07/13/19 1600 07/13/19 2233  HGB 14.9  --   --   --   --  14.5  --   --   --   HCT 41.1  --   --   --   --  40.8  --   --   --   PLT 204  --   --   --   --  208  --   --   --   APTT  --  28  --   --   --   --   --   --   --   LABPROT  --  12.7  --   --   --   --   --   --   --   INR  --  1.0  --   --   --   --   --   --   --   HEPARINUNFRC  --   --   --   --    < > 0.34 0.23* 0.29* 0.36  CREATININE 0.44*  --   --   --   --  0.47*  --   --   --   TROPONINIHS 53*  --  56* 58*  --   --   --   --   --    < > = values in this interval not displayed.   Estimated Creatinine Clearance: 112.9 mL/min (A) (by C-G formula based on SCr of 0.47 mg/dL (L)).  Medical History: Past Medical History:  Diagnosis Date  . CAD (coronary artery disease)    a. 01/2011 Anterior STEMI/Cath/PCI: LM nl, LAD 100d (3.5x19mm Vision BMS placed), LCX 7m, RI 50, RCA min irregs, EF 40% w/ apical, inferoapical HK.  . Cardiac arrest - ventricular fibrillation    a. In setting of STEMI 01/2011  . Cocaine abuse ([place])   . ETOH abuse    a. 6-12 beers/day  . Hemorrhoids   . Hypertension   . Ischemic cardiomyopathy    a. 06/2011 Echo: EF 45-50%, No rwma  . Marijuana abuse   . Tobacco abuse    a. 1/2 ppd x 26 yrs   Medications:  Medications Prior to Admission  Medication Sig Dispense Refill Last Dose  . gabapentin (NEURONTIN) 100 MG capsule Take 1 capsule (100 mg total) by  mouth 3 (three) times daily. 60 capsule 0 Past Week at Unknown time  . omeprazole (PRILOSEC OTC) 20 MG tablet Take 1 tablet (20 mg total) by mouth daily. 28 tablet 1 Past Week at Unknown time  . predniSONE (DELTASONE) 20 MG tablet 3 tabs po day one, then 2 tabs daily x 4 days 11 tablet 0 Past Week at Unknown time  . dicyclomine (BENTYL) 10 MG capsule Take 1 capsule (10 mg total) by mouth 3 (three) times daily as needed for up to 14 days for spasms. or abdominal pain 30 capsule 0   . feeding supplement, ENSURE  ENLIVE, (ENSURE ENLIVE) LIQD Take 237 mLs by mouth 2 (two) times daily between meals. 90 Bottle 0   . Multiple Vitamin (MULTIVITAMIN WITH MINERALS) TABS tablet Take 1 tablet by mouth daily. (Patient not taking: Reported on 07/12/2019) 180 tablet 0 Not Taking at Unknown time  . omeprazole (PRILOSEC) 20 MG capsule Take 1 capsule (20 mg total) by mouth 2 (two) times daily. 60 capsule 1   . ondansetron (ZOFRAN ODT) 4 MG disintegrating tablet Allow 1-2 tablets to dissolve in your mouth every 8 hours as needed for nausea/vomiting (Patient not taking: Reported on 07/12/2019) 30 tablet 0 Completed Course at Unknown time  . sucralfate (CARAFATE) 1 g tablet Take 1 tablet (1 g total) by mouth 4 (four) times daily as needed (for abdominal discomfort, nausea, and/or vomiting). (Patient not taking: Reported on 07/12/2019) 30 tablet 1 Not Taking at Unknown time  . traMADol (ULTRAM) 50 MG tablet Take 1 tablet (50 mg total) by mouth every 6 (six) hours as needed. (Patient not taking: Reported on 07/12/2019) 6 tablet 0 Completed Course at Unknown time   Assessment: Patient presents to the ED via EMS for chest pain x 3 days.  Patient reports chest pain is so severe that he had vomiting yesterday.  Patient has history of 3 previous heart attacks.  Patient has history of IV drug use and reports using cocaine 3 days ago.  Patient describes pain as pressure.  EMS gave patient 1 nitro spray and 4 baby aspirin.    7/27  HL@1755 : <0.10. Called and asked nurse if line was stopped for any reason. Nurse stated that patient did pull IV out and began chewing line but at most it was stopped for 10 minutes. 7/28 0213 HL 0.34 7/28 0755 HL 0.23: Will increase rate to 1350 unit/hr.  7/28 1600 HL 0.29: Will increase rate to 1500 units/hr.  Goal of Therapy:  Heparin level 0.3-0.7 units/ml Monitor platelets by anticoagulation protocol: Yes   Plan:  07/28 @ 2230 HL 0.36 therapeutic. Will continue current rate and will recheck HL w/ am labs. CBC has been stable, will recheck w/ am labs.  Tobie Lords, PharmD Clinical Pharmacist 07/13/2019 11:23 PM

## 2019-07-13 NOTE — Plan of Care (Signed)
  Problem: Education: Goal: Knowledge of General Education information will improve Description: Including pain rating scale, medication(s)/side effects and non-pharmacologic comfort measures Outcome: Progressing   Problem: Health Behavior/Discharge Planning: Goal: Ability to manage health-related needs will improve Outcome: Progressing   Problem: Clinical Measurements: Goal: Ability to maintain clinical measurements within normal limits will improve Outcome: Not Progressing Note: Sodium level = only 134 today. Will continue to monitor lab results. Wenda Low [facility]

## 2019-07-13 NOTE — Progress Notes (Addendum)
ANTICOAGULATION CONSULT NOTE - Initial Consult  Pharmacy Consult for Heparin Indication: chest pain/ACS  Allergies  Allergen Reactions  . Penicillins Hives   Patient Measurements: Height: 5\' 5"  (165.1 cm) Weight: 182 lb 1.6 oz (82.6 kg) IBW/kg (Calculated) : 61.5 Heparin Dosing Weight: 78.3Kg  Vital Signs: Temp: 98.1 F (36.7 C) (07/28 0832) Temp Source: Oral (07/28 0832) BP: 124/81 (07/28 0832) Pulse Rate: 103 (07/28 0832)  Labs: Recent Labs    07/12/19 0745 07/12/19 0956 07/12/19 1138 07/12/19 1406  07/13/19 0213 07/13/19 0755 07/13/19 1600  HGB 14.9  --   --   --   --  14.5  --   --   HCT 41.1  --   --   --   --  40.8  --   --   PLT 204  --   --   --   --  208  --   --   APTT  --  28  --   --   --   --   --   --   LABPROT  --  12.7  --   --   --   --   --   --   INR  --  1.0  --   --   --   --   --   --   HEPARINUNFRC  --   --   --   --    < > 0.34 0.23* 0.29*  CREATININE 0.44*  --   --   --   --  0.47*  --   --   TROPONINIHS 53*  --  56* 58*  --   --   --   --    < > = values in this interval not displayed.   Estimated Creatinine Clearance: 112.9 mL/min (A) (by C-G formula based on SCr of 0.47 mg/dL (L)).  Medical History: Past Medical History:  Diagnosis Date  . CAD (coronary artery disease)    a. 01/2011 Anterior STEMI/Cath/PCI: LM nl, LAD 100d (3.5x55mm Vision BMS placed), LCX 57m, RI 50, RCA min irregs, EF 40% w/ apical, inferoapical HK.  . Cardiac arrest - ventricular fibrillation    a. In setting of STEMI 01/2011  . Cocaine abuse ([place])   . ETOH abuse    a. 6-12 beers/day  . Hemorrhoids   . Hypertension   . Ischemic cardiomyopathy    a. 06/2011 Echo: EF 45-50%, No rwma  . Marijuana abuse   . Tobacco abuse    a. 1/2 ppd x 26 yrs   Medications:  Medications Prior to Admission  Medication Sig Dispense Refill Last Dose  . gabapentin (NEURONTIN) 100 MG capsule Take 1 capsule (100 mg total) by mouth 3 (three) times daily. 60 capsule 0 Past Week at  Unknown time  . omeprazole (PRILOSEC OTC) 20 MG tablet Take 1 tablet (20 mg total) by mouth daily. 28 tablet 1 Past Week at Unknown time  . predniSONE (DELTASONE) 20 MG tablet 3 tabs po day one, then 2 tabs daily x 4 days 11 tablet 0 Past Week at Unknown time  . dicyclomine (BENTYL) 10 MG capsule Take 1 capsule (10 mg total) by mouth 3 (three) times daily as needed for up to 14 days for spasms. or abdominal pain 30 capsule 0   . feeding supplement, ENSURE ENLIVE, (ENSURE ENLIVE) LIQD Take 237 mLs by mouth 2 (two) times daily between meals. 90 Bottle 0   . Multiple Vitamin (MULTIVITAMIN WITH MINERALS) TABS  tablet Take 1 tablet by mouth daily. (Patient not taking: Reported on 07/12/2019) 180 tablet 0 Not Taking at Unknown time  . omeprazole (PRILOSEC) 20 MG capsule Take 1 capsule (20 mg total) by mouth 2 (two) times daily. 60 capsule 1   . ondansetron (ZOFRAN ODT) 4 MG disintegrating tablet Allow 1-2 tablets to dissolve in your mouth every 8 hours as needed for nausea/vomiting (Patient not taking: Reported on 07/12/2019) 30 tablet 0 Completed Course at Unknown time  . sucralfate (CARAFATE) 1 g tablet Take 1 tablet (1 g total) by mouth 4 (four) times daily as needed (for abdominal discomfort, nausea, and/or vomiting). (Patient not taking: Reported on 07/12/2019) 30 tablet 1 Not Taking at Unknown time  . traMADol (ULTRAM) 50 MG tablet Take 1 tablet (50 mg total) by mouth every 6 (six) hours as needed. (Patient not taking: Reported on 07/12/2019) 6 tablet 0 Completed Course at Unknown time   Assessment: Patient presents to the ED via EMS for chest pain x 3 days.  Patient reports chest pain is so severe that he had vomiting yesterday.  Patient has history of 3 previous heart attacks.  Patient has history of IV drug use and reports using cocaine 3 days ago.  Patient describes pain as pressure.  EMS gave patient 1 nitro spray and 4 baby aspirin.    7/27 HL@1755 : <0.10. Called and asked nurse if line was stopped  for any reason. Nurse stated that patient did pull IV out and began chewing line but at most it was stopped for 10 minutes. 7/28 0213 HL 0.34 7/28 0755 HL 0.23: Will increase rate to 1350 unit/hr.  7/28 1600 HL 0.29: Will increase rate to 1500 units/hr.  Goal of Therapy:  Heparin level 0.3-0.7 units/ml Monitor platelets by anticoagulation protocol: Yes   Plan:  Heparin level is slightly subtherapeutic. Will continue increase rate to 1500 units/hr and will recheck HL @ 2230, CBC stable will continue to monitor.  Pearla Dubonnet, PharmD Clinical Pharmacist 07/13/2019 4:28 PM

## 2019-07-13 NOTE — Progress Notes (Signed)
ANTICOAGULATION CONSULT NOTE - Initial Consult  Pharmacy Consult for Heparin Indication: chest pain/ACS  Allergies  Allergen Reactions  . Penicillins Hives   Patient Measurements: Height: 5\' 5"  (165.1 cm) Weight: 182 lb 1.6 oz (82.6 kg) IBW/kg (Calculated) : 61.5 Heparin Dosing Weight: 78.3Kg  Vital Signs: Temp: 98.1 F (36.7 C) (07/28 0832) Temp Source: Oral (07/28 0832) BP: 124/81 (07/28 0832) Pulse Rate: 103 (07/28 0832)  Labs: Recent Labs    07/12/19 0745 07/12/19 0956 07/12/19 1138 07/12/19 1406 07/12/19 1755 07/13/19 0213 07/13/19 0755  HGB 14.9  --   --   --   --  14.5  --   HCT 41.1  --   --   --   --  40.8  --   PLT 204  --   --   --   --  208  --   APTT  --  28  --   --   --   --   --   LABPROT  --  12.7  --   --   --   --   --   INR  --  1.0  --   --   --   --   --   HEPARINUNFRC  --   --   --   --  <0.10* 0.34 0.23*  CREATININE 0.44*  --   --   --   --  0.47*  --   TROPONINIHS 53*  --  56* 58*  --   --   --    Estimated Creatinine Clearance: 112.9 mL/min (A) (by C-G formula based on SCr of 0.47 mg/dL (L)).  Medical History: Past Medical History:  Diagnosis Date  . CAD (coronary artery disease)    a. 01/2011 Anterior STEMI/Cath/PCI: LM nl, LAD 100d (3.5x35mm Vision BMS placed), LCX 38m, RI 50, RCA min irregs, EF 40% w/ apical, inferoapical HK.  . Cardiac arrest - ventricular fibrillation    a. In setting of STEMI 01/2011  . Cocaine abuse ([place])   . ETOH abuse    a. 6-12 beers/day  . Hemorrhoids   . Hypertension   . Ischemic cardiomyopathy    a. 06/2011 Echo: EF 45-50%, No rwma  . Marijuana abuse   . Tobacco abuse    a. 1/2 ppd x 26 yrs   Medications:  Medications Prior to Admission  Medication Sig Dispense Refill Last Dose  . gabapentin (NEURONTIN) 100 MG capsule Take 1 capsule (100 mg total) by mouth 3 (three) times daily. 60 capsule 0 Past Week at Unknown time  . omeprazole (PRILOSEC OTC) 20 MG tablet Take 1 tablet (20 mg total) by mouth  daily. 28 tablet 1 Past Week at Unknown time  . predniSONE (DELTASONE) 20 MG tablet 3 tabs po day one, then 2 tabs daily x 4 days 11 tablet 0 Past Week at Unknown time  . dicyclomine (BENTYL) 10 MG capsule Take 1 capsule (10 mg total) by mouth 3 (three) times daily as needed for up to 14 days for spasms. or abdominal pain 30 capsule 0   . feeding supplement, ENSURE ENLIVE, (ENSURE ENLIVE) LIQD Take 237 mLs by mouth 2 (two) times daily between meals. 90 Bottle 0   . Multiple Vitamin (MULTIVITAMIN WITH MINERALS) TABS tablet Take 1 tablet by mouth daily. (Patient not taking: Reported on 07/12/2019) 180 tablet 0 Not Taking at Unknown time  . omeprazole (PRILOSEC) 20 MG capsule Take 1 capsule (20 mg total) by mouth 2 (two) times daily.  60 capsule 1   . ondansetron (ZOFRAN ODT) 4 MG disintegrating tablet Allow 1-2 tablets to dissolve in your mouth every 8 hours as needed for nausea/vomiting (Patient not taking: Reported on 07/12/2019) 30 tablet 0 Completed Course at Unknown time  . sucralfate (CARAFATE) 1 g tablet Take 1 tablet (1 g total) by mouth 4 (four) times daily as needed (for abdominal discomfort, nausea, and/or vomiting). (Patient not taking: Reported on 07/12/2019) 30 tablet 1 Not Taking at Unknown time  . traMADol (ULTRAM) 50 MG tablet Take 1 tablet (50 mg total) by mouth every 6 (six) hours as needed. (Patient not taking: Reported on 07/12/2019) 6 tablet 0 Completed Course at Unknown time   Assessment: Patient presents to the ED via EMS for chest pain x 3 days.  Patient reports chest pain is so severe that he had vomiting yesterday.  Patient has history of 3 previous heart attacks.  Patient has history of IV drug use and reports using cocaine 3 days ago.  Patient describes pain as pressure.  EMS gave patient 1 nitro spray and 4 baby aspirin.    7/27 HL@1755 : <0.10. Called and asked nurse if line was stopped for any reason. Nurse stated that patient did pull IV out and began chewing line but at most it  was stopped for 10 minutes. 7/28 0213 HL 0.34 7/28 0755 HL 0.23: Will increase rate to 1350 unit/hr.   Goal of Therapy:  Heparin level 0.3-0.7 units/ml Monitor platelets by anticoagulation protocol: Yes   Plan:  Heparin level subtherapeutic. Will continue increase rate to 1350 units/hr and will recheck HL @ 1500, CBC stable will continue to monitor.  Eleonore Chiquito, PharmD, BCPS Clinical Pharmacist 07/13/2019,8:59 AM

## 2019-07-14 ENCOUNTER — Inpatient Hospital Stay (HOSPITAL_COMMUNITY): Payer: Medicare HMO

## 2019-07-14 DIAGNOSIS — R079 Chest pain, unspecified: Secondary | ICD-10-CM

## 2019-07-14 DIAGNOSIS — R7989 Other specified abnormal findings of blood chemistry: Secondary | ICD-10-CM

## 2019-07-14 LAB — NM MYOCAR MULTI W/SPECT W/WALL MOTION / EF
Estimated workload: 1 METS
Exercise duration (min): 0 min
Exercise duration (sec): 0 s
LV dias vol: 87 mL (ref 62–150)
LV sys vol: 37 mL
MPHR: 173 {beats}/min
Peak HR: 115 {beats}/min
Percent HR: 66 %
Rest HR: 97 {beats}/min
SDS: 3
SRS: 2
SSS: 1
TID: 0.91

## 2019-07-14 LAB — CBC
HCT: 36.8 % — ABNORMAL LOW (ref 39.0–52.0)
Hemoglobin: 12.6 g/dL — ABNORMAL LOW (ref 13.0–17.0)
MCH: 38 pg — ABNORMAL HIGH (ref 26.0–34.0)
MCHC: 34.2 g/dL (ref 30.0–36.0)
MCV: 110.8 fL — ABNORMAL HIGH (ref 80.0–100.0)
Platelets: 166 10*3/uL (ref 150–400)
RBC: 3.32 MIL/uL — ABNORMAL LOW (ref 4.22–5.81)
RDW: 12.9 % (ref 11.5–15.5)
WBC: 10.1 10*3/uL (ref 4.0–10.5)
nRBC: 0 % (ref 0.0–0.2)

## 2019-07-14 LAB — BASIC METABOLIC PANEL
Anion gap: 10 (ref 5–15)
BUN: 7 mg/dL (ref 6–20)
CO2: 20 mmol/L — ABNORMAL LOW (ref 22–32)
Calcium: 8.5 mg/dL — ABNORMAL LOW (ref 8.9–10.3)
Chloride: 107 mmol/L (ref 98–111)
Creatinine, Ser: 0.6 mg/dL — ABNORMAL LOW (ref 0.61–1.24)
GFR calc Af Amer: >60 mL/min (ref >60)
GFR calc non Af Amer: >60 mL/min (ref >60)
Glucose, Bld: 93 mg/dL (ref 70–99)
Potassium: 3.2 mmol/L — ABNORMAL LOW (ref 3.5–5.1)
Sodium: 137 mmol/L (ref 135–145)

## 2019-07-14 LAB — GLUCOSE, CAPILLARY
Glucose-Capillary: 104 mg/dL — ABNORMAL HIGH (ref 70–99)
Glucose-Capillary: 104 mg/dL — ABNORMAL HIGH (ref 70–99)
Glucose-Capillary: 110 mg/dL — ABNORMAL HIGH (ref 70–99)
Glucose-Capillary: 114 mg/dL — ABNORMAL HIGH (ref 70–99)
Glucose-Capillary: 135 mg/dL — ABNORMAL HIGH (ref 70–99)
Glucose-Capillary: 80 mg/dL (ref 70–99)

## 2019-07-14 LAB — MAGNESIUM: Magnesium: 1.9 mg/dL (ref 1.7–2.4)

## 2019-07-14 LAB — HEPARIN LEVEL (UNFRACTIONATED): Heparin Unfractionated: 0.43 IU/mL (ref 0.30–0.70)

## 2019-07-14 MED ORDER — POTASSIUM CHLORIDE CRYS ER 20 MEQ PO TBCR
40.0000 meq | EXTENDED_RELEASE_TABLET | Freq: Once | ORAL | Status: AC
  Administered 2019-07-14: 40 meq via ORAL
  Filled 2019-07-14: qty 2

## 2019-07-14 MED ORDER — TECHNETIUM TC 99M TETROFOSMIN IV KIT
28.0300 | PACK | Freq: Once | INTRAVENOUS | Status: AC | PRN
  Administered 2019-07-14: 28.03 via INTRAVENOUS

## 2019-07-14 MED ORDER — POTASSIUM CHLORIDE CRYS ER 20 MEQ PO TBCR
40.0000 meq | EXTENDED_RELEASE_TABLET | Freq: Two times a day (BID) | ORAL | Status: AC
  Administered 2019-07-14 (×2): 40 meq via ORAL
  Filled 2019-07-14 (×2): qty 2

## 2019-07-14 MED ORDER — POLYETHYLENE GLYCOL 3350 17 G PO PACK
17.0000 g | PACK | Freq: Every day | ORAL | Status: DC
  Administered 2019-07-14 – 2019-07-15 (×2): 17 g via ORAL
  Filled 2019-07-14 (×2): qty 1

## 2019-07-14 MED ORDER — ENOXAPARIN SODIUM 40 MG/0.4ML ~~LOC~~ SOLN
40.0000 mg | SUBCUTANEOUS | Status: DC
  Administered 2019-07-14: 40 mg via SUBCUTANEOUS
  Filled 2019-07-14: qty 0.4

## 2019-07-14 MED ORDER — REGADENOSON 0.4 MG/5ML IV SOLN
0.4000 mg | Freq: Once | INTRAVENOUS | Status: AC
  Administered 2019-07-14: 0.4 mg via INTRAVENOUS
  Filled 2019-07-14: qty 5

## 2019-07-14 MED ORDER — DOCUSATE SODIUM 100 MG PO CAPS
100.0000 mg | ORAL_CAPSULE | Freq: Two times a day (BID) | ORAL | Status: DC
  Administered 2019-07-14 – 2019-07-15 (×3): 100 mg via ORAL
  Filled 2019-07-14 (×3): qty 1

## 2019-07-14 MED ORDER — TECHNETIUM TC 99M TETROFOSMIN IV KIT
9.5400 | PACK | Freq: Once | INTRAVENOUS | Status: AC | PRN
  Administered 2019-07-14: 9.54 via INTRAVENOUS

## 2019-07-14 NOTE — Progress Notes (Signed)
[place] at [facility]                                                                                                                                                                                  Patient Demographics   Ethan Wells, is a 48 y.o. male, DOB - 1971/11/04, AYT:[id number]  Admit date - 07/12/2019   Admitting Physician Nicholes Mango, MD  Outpatient Primary MD for the patient is [facility], [facility]   LOS - 1  Subjective:  Patient seen and evaluated today Complains of chest pain retrosternally and radiates to the left chest More awake and alert responds to all verbal commands Has been n.p.o. since last night   Review of Systems:   CONSTITUTIONAL: No documented fever. No fatigue, weakness. No weight gain, no weight loss.  EYES: No blurry or double vision.  ENT: No tinnitus. No postnasal drip. No redness of the oropharynx.  RESPIRATORY: No cough, no wheeze, no hemoptysis. No dyspnea.  CARDIOVASCULAR: Positive chest pain. No orthopnea. No palpitations. No syncope.  GASTROINTESTINAL: No nausea, no vomiting or diarrhea. No abdominal pain. No melena or hematochezia.  GENITOURINARY: No dysuria or hematuria.  ENDOCRINE: No polyuria or nocturia. No heat or cold intolerance.  HEMATOLOGY: No anemia. No bruising. No bleeding.  INTEGUMENTARY: No rashes. No lesions.  MUSCULOSKELETAL: No arthritis. No swelling. No gout.  NEUROLOGIC: No numbness, tingling, or ataxia. No seizure-type activity.  PSYCHIATRIC: Positive anxiety. No insomnia. No ADD.    Vitals:   Vitals:   07/13/19 0832 07/13/19 1943 07/14/19 0418 07/14/19 0745  BP: 124/81 117/77 110/71 110/79  Pulse: (!) 103 91 86 86  Resp: 18 18 17 19   Temp: 98.1 F (36.7 C) 97.9 F (36.6 C) 98 F (36.7 C) 97.9 F (36.6 C)  TempSrc: Oral Oral Oral Oral  SpO2: 98% 99% 98% 100%  Weight:      Height:        Wt Readings from Last 3 Encounters:  07/13/19 82.6 kg  06/23/19 81.6 kg   06/17/19 81.6 kg     Intake/Output Summary (Last 24 hours) at 07/14/2019 1243 Last data filed at 07/14/2019 0243 Gross per 24 hour  Intake -  Output 450 ml  Net -450 ml    Physical Exam:   GENERAL: Pleasant-appearing in no apparent distress.  HEAD, EYES, EARS, NOSE AND THROAT: Atraumatic, normocephalic. Extraocular muscles are intact. Pupils equal and reactive to light. Sclerae anicteric. No conjunctival injection. No oro-pharyngeal erythema.  NECK: Supple. There is no jugular venous distention. No bruits, no lymphadenopathy, no thyromegaly.  HEART: Regular rate and rhythm,. No murmurs, no rubs, no clicks.  LUNGS: Clear to auscultation bilaterally.  No rales or rhonchi. No wheezes.  ABDOMEN: Soft, flat, nontender, nondistended. Has good bowel sounds. No hepatosplenomegaly appreciated.  EXTREMITIES: No evidence of any cyanosis, clubbing, or peripheral edema.  +2 pedal and radial pulses bilaterally.  NEUROLOGIC: The patient is alert, awake, and oriented x3 with no focal motor or sensory deficits appreciated bilaterally.  SKIN: Moist and warm with no rashes appreciated.  Psych: Tremulous LN: No inguinal LN enlargement    Antibiotics   Anti-infectives (From admission, onward)   None      Medications   Scheduled Meds: . aspirin EC  81 mg Oral Daily  . carvedilol  6.25 mg Oral BID WC  . docusate sodium  100 mg Oral BID  . folic acid  1 mg Oral Daily  . gabapentin  100 mg Oral TID  . insulin aspart  0-5 Units Subcutaneous QHS  . insulin aspart  0-9 Units Subcutaneous TID WC  . isosorbide mononitrate  30 mg Oral Daily  . lisinopril  2.5 mg Oral Daily  . multivitamin with minerals  1 tablet Oral Daily  . nicotine  21 mg Transdermal Daily  . pantoprazole  40 mg Oral Daily  . polyethylene glycol  17 g Oral Daily  . potassium chloride  40 mEq Oral BID  . thiamine  100 mg Oral Daily   Or  . thiamine  100 mg Intravenous Daily   Continuous Infusions: . heparin 1,500 Units/hr  (07/13/19 2257)   PRN Meds:.acetaminophen, dicyclomine, LORazepam **OR** LORazepam, morphine injection, nitroGLYCERIN, ondansetron (ZOFRAN) IV   Data Review:   Micro Results Recent Results (from the past 240 hour(s))  SARS Coronavirus 2 (CEPHEID- Performed in [place] hospital lab), Hosp Order     Status: None   Collection Time: 07/12/19  9:55 AM   Specimen: Nasopharyngeal Swab  Result Value Ref Range Status   SARS Coronavirus 2 NEGATIVE NEGATIVE Final    Comment: (NOTE) If result is NEGATIVE SARS-CoV-2 target nucleic acids are NOT DETECTED. The SARS-CoV-2 RNA is generally detectable in upper and lower  respiratory specimens during the acute phase of infection. The lowest  concentration of SARS-CoV-2 viral copies this assay can detect is 250  copies / mL. A negative result does not preclude SARS-CoV-2 infection  and should not be used as the sole basis for treatment or other  patient management decisions.  A negative result may occur with  improper specimen collection / handling, submission of specimen other  than nasopharyngeal swab, presence of viral mutation(s) within the  areas targeted by this assay, and inadequate number of viral copies  (<250 copies / mL). A negative result must be combined with clinical  observations, patient history, and epidemiological information. If result is POSITIVE SARS-CoV-2 target nucleic acids are DETECTED. The SARS-CoV-2 RNA is generally detectable in upper and lower  respiratory specimens dur ing the acute phase of infection.  Positive  results are indicative of active infection with SARS-CoV-2.  Clinical  correlation with patient history and other diagnostic information is  necessary to determine patient infection status.  Positive results do  not rule out bacterial infection or co-infection with other viruses. If result is PRESUMPTIVE POSTIVE SARS-CoV-2 nucleic acids MAY BE PRESENT.   A presumptive positive result was obtained on the  submitted specimen  and confirmed on repeat testing.  While 2019 novel coronavirus  (SARS-CoV-2) nucleic acids may be present in the submitted sample  additional confirmatory testing may be necessary for epidemiological  and / or clinical management purposes  to differentiate between  SARS-CoV-2 and other Sarbecovirus currently known to infect humans.  If clinically indicated additional testing with an alternate test  methodology [phone]) is advised. The SARS-CoV-2 RNA is generally  detectable in upper and lower respiratory sp ecimens during the acute  phase of infection. The expected result is Negative. Fact Sheet for Patients:  StrictlyIdeas.no Fact Sheet for Healthcare Providers: BankingDealers.co.za This test is not yet approved or cleared by the [place] FDA and has been authorized for detection and/or diagnosis of SARS-CoV-2 by FDA under an Emergency Use Authorization (EUA).  This EUA will remain in effect (meaning this test can be used) for the duration of the COVID-19 declaration under Section 564(b)(1) of the Act, 21 U.S.C. section 360bbb-3(b)(1), unless the authorization is terminated or revoked sooner. Performed at [facility], [address], [place], [place] [address]     Radiology Reports Dg Chest 2 View  Result Date: 06/17/2019 CLINICAL DATA:  Shortness of breath EXAM: CHEST - 2 VIEW COMPARISON:  05/25/2018 FINDINGS: Normal heart size and mediastinal contours. No acute infiltrate or edema. No effusion or pneumothorax. No acute osseous findings. IMPRESSION: Negative chest. Electronically Signed   By: Monte Fantasia M.D.   On: 06/17/2019 05:50   Dg Chest Portable 1 View  Result Date: 07/12/2019 CLINICAL DATA:  Chest pain EXAM: PORTABLE CHEST 1 VIEW COMPARISON:  Chest radiograph 06/17/2019 FINDINGS: Single view AP erect radiograph. The heart is not enlarged. The lungs are clear. No evidence of pleural  effusion or pneumothorax. The bones are unremarkable. IMPRESSION: Clear lungs. Electronically Signed   By: Kellie Simmering   On: 07/12/2019 08:19   Dg Shoulder Left Portable  Result Date: 06/17/2019 CLINICAL DATA:  Left shoulder pain EXAM: LEFT SHOULDER - 1 VIEW COMPARISON:  03/07/2011 FINDINGS: Chronic Hill-Sachs deformity. The glenohumeral joint and acromioclavicular joint are located. Mild AC joint spurring. IMPRESSION: 1. No acute finding. 2. Chronic Hill-Sachs deformity. Electronically Signed   By: Monte Fantasia M.D.   On: 06/17/2019 06:35     CBC Recent Labs  Lab 07/12/19 0745 07/13/19 0213 07/14/19 0418  WBC 8.2 8.9 10.1  HGB 14.9 14.5 12.6*  HCT 41.1 40.8 36.8*  PLT 204 208 166  MCV 105.7* 107.9* 110.8*  MCH 38.3* 38.4* 38.0*  MCHC 36.3* 35.5 34.2  RDW 12.7 12.9 12.9  LYMPHSABS 1.6  --   --   MONOABS 0.6  --   --   EOSABS 0.1  --   --   BASOSABS 0.0  --   --     Chemistries  Recent Labs  Lab 07/12/19 0745 07/13/19 0213 07/14/19 0418  NA 131* 134* 137  K 3.0* 3.3* 3.2*  CL 99 102 107  CO2 22 22 20*  GLUCOSE 133* 113* 93  BUN 8 8 7   CREATININE 0.44* 0.47* 0.60*  CALCIUM 8.6* 8.5* 8.5*  MG  --  1.8 1.9  AST 90*  --   --   ALT 52*  --   --   ALKPHOS 119  --   --   BILITOT 3.1*  --   --    ------------------------------------------------------------------------------------------------------------------ estimated creatinine clearance is 112.9 mL/min (A) (by C-G formula based on SCr of 0.6 mg/dL (L)). ------------------------------------------------------------------------------------------------------------------ Recent Labs    07/12/19 1154 07/13/19 0213  HGBA1C 5.4 5.4   ------------------------------------------------------------------------------------------------------------------ Recent Labs    07/13/19 0213  CHOL 193  HDL 53  LDLCALC 118*  TRIG 109  CHOLHDL 3.6    ------------------------------------------------------------------------------------------------------------------ Recent Labs  07/12/19 1755  TSH 1.572   ------------------------------------------------------------------------------------------------------------------ No results for input(s): VITAMINB12, FOLATE, FERRITIN, TIBC, IRON, RETICCTPCT in the last 72 hours.  Coagulation profile Recent Labs  Lab 07/12/19 0956  INR 1.0    No results for input(s): DDIMER in the last 72 hours.  Cardiac Enzymes No results for input(s): CKMB, TROPONINI, MYOGLOBIN in the last 168 hours.  Invalid input(s): CK ------------------------------------------------------------------------------------------------------------------ Invalid input(s): [place]   #Unstable angina with illicit drug use-patient is admitting cocaine and marijuana Appreciate cardiology input Echocardiogram shows reduction in ejection fraction Cardiology recommends cardiac stress test today Stress test will be done today Continue isosorbide mononitrate, aspirin, carvedilol, lisinopril and IV heparin  #Hyponatremia-most likely alcohol-related Improved with IV fluid  #Hypokalemia-continue replacing  # borderline diabetes Hemoglobin A1c is 5.4 no need for treatment  #Alcohol abuse Continue [place] Multivitamin thiamine folate and IV fluids Outpatient alcohol Anonymous group to be joint  #Tobacco abuse disorder counseled patient to quit smoking for 6 minutes.  He verbalized understanding   #History of illicit drug abuse      Code Status Orders  (From admission, onward)         Start     Ordered   07/12/19 1246  Full code  Continuous     07/12/19 1245        Code Status History    Date Active Date Inactive Code Status Order ID Comments User Context   05/25/2018 0819 05/27/2018 1553 Full Code [id number]  Arta Silence, MD Inpatient   Advance Care Planning Activity            Consults cardiology  DVT Prophylaxis continue heparin  Lab Results  Component Value Date   PLT 166 07/14/2019     Time Spent in minutes 35 minutes spent greater than 50% of time spent in care coordination and counseling patient regarding the condition and plan of care.   Saundra Shelling M.D on 07/14/2019 at 12:43 PM  Between 7am to 6pm - Pager - [phone]  After 6pm go to [email or web address] - Proofreader  [employer]   Office  [phone]

## 2019-07-14 NOTE — Progress Notes (Signed)
Progress Note  Patient Name: Ethan Wells Date of Encounter: 07/14/2019  Primary Cardiologist: Dr. Angelena Form  Subjective   Continued chest pain, rated 8/10 in severity and centrally located along his sternum with radiation to left chest.  Pain is also described as pleuritic and worse with deep breathing with associated central back pain as well.  Continued shortness of breath.  No current feelings of racing heart rate or palpitations.    Patient is much more oriented today and able to coherently communicate with providers. Consented for stress testing today as patient reported he has been NPO past midnight. Patient noted some concern regarding laying flat for scan given nerve pain but feels he could for a short period of time.  Inpatient Medications    Scheduled Meds: . aspirin EC  81 mg Oral Daily  . carvedilol  6.25 mg Oral BID WC  . folic acid  1 mg Oral Daily  . gabapentin  100 mg Oral TID  . insulin aspart  0-5 Units Subcutaneous QHS  . insulin aspart  0-9 Units Subcutaneous TID WC  . isosorbide mononitrate  30 mg Oral Daily  . lisinopril  2.5 mg Oral Daily  . multivitamin with minerals  1 tablet Oral Daily  . nicotine  21 mg Transdermal Daily  . pantoprazole  40 mg Oral Daily  . thiamine  100 mg Oral Daily   Or  . thiamine  100 mg Intravenous Daily   Continuous Infusions: . heparin 1,500 Units/hr (07/13/19 2257)   PRN Meds: acetaminophen, dicyclomine, LORazepam **OR** LORazepam, morphine injection, nitroGLYCERIN, ondansetron (ZOFRAN) IV   Vital Signs    Vitals:   07/13/19 0832 07/13/19 1943 07/14/19 0418 07/14/19 0745  BP: 124/81 117/77 110/71 110/79  Pulse: (!) 103 91 86 86  Resp: 18 18 17 19   Temp: 98.1 F (36.7 C) 97.9 F (36.6 C) 98 F (36.7 C) 97.9 F (36.6 C)  TempSrc: Oral Oral Oral Oral  SpO2: 98% 99% 98% 100%  Weight:      Height:        Intake/Output Summary (Last 24 hours) at 07/14/2019 0947 Last data filed at 07/14/2019 0243 Gross per  24 hour  Intake 0 ml  Output 1050 ml  Net -1050 ml   Last 3 Weights 07/13/2019 07/12/2019 07/12/2019  Weight (lbs) 182 lb 1.6 oz 183 lb 180 lb  Weight (kg) 82.6 kg 83.008 kg 81.647 kg      Telemetry    SR to ST, 80-120s, rare ectopy- Personally Reviewed  ECG    No new tracings - Personally Reviewed  Physical Exam   GEN: No acute distress. No current hallucinations. Much more oriented today Neck: No JVD Cardiac: RRR, no murmurs, rubs, or gallops.  Respiratory: Reduced breath sounds bilaterally due to poor inspiratory effort. GI: Soft, nontender, non-distended  MS: No b/l lower extremity edema; No deformity. Neuro:  Oriented today Psych: Normal affect - no current hallucinations.  Labs    High Sensitivity Troponin:   Recent Labs  Lab 06/17/19 0030 06/17/19 0647 07/12/19 0745 07/12/19 1138 07/12/19 1406  TROPONINIHS 7 8 53* 56* 58*      Cardiac EnzymesNo results for input(s): TROPONINI in the last 168 hours. No results for input(s): TROPIPOC in the last 168 hours.   Chemistry Recent Labs  Lab 07/12/19 0745 07/13/19 0213 07/14/19 0418  NA 131* 134* 137  K 3.0* 3.3* 3.2*  CL 99 102 107  CO2 22 22 20*  GLUCOSE 133* 113* 93  BUN 8 8 7   CREATININE 0.44* 0.47* 0.60*  CALCIUM 8.6* 8.5* 8.5*  PROT 7.5  --   --   ALBUMIN 3.7  --   --   AST 90*  --   --   ALT 52*  --   --   ALKPHOS 119  --   --   BILITOT 3.1*  --   --   GFRNONAA >60 >60 >60  GFRAA >60 >60 >60  ANIONGAP 10 10 10      Hematology Recent Labs  Lab 07/12/19 0745 07/13/19 0213 07/14/19 0418  WBC 8.2 8.9 10.1  RBC 3.89* 3.78* 3.32*  HGB 14.9 14.5 12.6*  HCT 41.1 40.8 36.8*  MCV 105.7* 107.9* 110.8*  MCH 38.3* 38.4* 38.0*  MCHC 36.3* 35.5 34.2  RDW 12.7 12.9 12.9  PLT 204 208 166    BNPNo results for input(s): BNP, PROBNP in the last 168 hours.   DDimer No results for input(s): DDIMER in the last 168 hours.   Radiology    No results found.  Cardiac Studies   07/14/2019 Echo  1.  Moderate hypokinesis of the left ventricular, entire anterior wall.  2. The left ventricle has mildly reduced systolic function, with an ejection fraction of 45-50%. The cavity size was normal. Indeterminate diastolic filling due to E-A fusion.  3. The aortic valve was not well visualized. Mild thickening of the aortic valve. Mild calcification of the aortic valve.  4. The aorta is abnormal in size and structure.  5. There is mild dilatation of the aortic root measuring 37 mm.  6. The interatrial septum was not well visualized.  07/02/2011 Echo Left ventricle: The cavity size was normal. Wall thickness was  normal. Systolic function was mildly reduced. The estimated ejection  fraction was in the range of 45% to 50%. Wall motion was normal;  there were no regional wall motion abnormalities. Left ventricular  diastolic function parameters were normal.    Patient Profile     48 y.o. male with a hx of CAD, ischemic cardiomyopathy, acute anterior wall STEMI in 2012 s/p BMS to LAD, ongoing and current tobacco/cocaine/alcohol/ marijuana abuse, h/o medication noncompliance, h/o depression, and who is being seen today for the evaluation of chest pain.  Assessment & Plan    Atypical chest pain in the setting of cocaine, amphetamine, THC use History of CAD s/p LAD stenting (2012), ICM --Patient continues to report 8/10 CP that is centrally located, radiating to left side of chest, and pleuritic with associated back pain with deep breaths. Some relief with pain medication but otherwise no other alleviating factors. Continues to feel SOB.  Initial admit UA showing amphetamines/cocaine/THC.  --CAD history s/p 2012 acute anterior wall STEMI with bare-metal stent placement to LAD.  Stress test in 2013 ruled low risk. Lost to follow-up in 2014 with no current cardiac medications. - Initial EKG without acute ST/T changes.  Repeat EKG without acute ST/T changes as above, prolonged QTC, mild nonspecific ST/T  changes. Hs Tn minimally elevated and flat trending 53  56  58 in the setting of stimulant use with corresponding elevated HR and BP at presentation and thought most likely d/t supply demand ischemia.  --Patient does have a history of CAD/ICM with multiple risk factors including tobacco use and LDL of 118 and would benefit from further ischemic work-up once stable.  -Echo above with mildly reduced LVEF and anterior wall motion abnormality in LAD distribution / location of previous LAD BMS. Thought likely a poor PCI  candidate d/t polysubstance abuse and history of noncompliance.  --Given refractory chest pain and improving mental status, will obtain Lexiscan with further recommendations regarding after stress test. Patient has been NPO.  - Considering pleuritic component, also would consider workup for pulmonary embolism per IM. --Continue IV heparin for at least 1 additional day (48h total) with future consideration of DAPT with ASA and clopidogrel to be determined. For now, continue medical management with ASA, Coreg, Imdur, and lisinopril. Continue to recommend holding statin medication given mild elevated liver function. Further recommendations following additional ischemic workup as above.   Prolonged QTc --Continue to monitor. Caution with QT prolonging medications.  Hypokalemia --K 3.3  3.2. Continue to recommend replete with goal 4.0. Check Mg. Daily BMET.  HTN --Controlled. Continue medical management above.  Sinus tachycardia --Improved. Continue medical management.  Current multi-drug and alcohol abuse -UA tox screen with positive for amphetamines, cocaine, marijuana at admission.  Patient also reports alcohol and tobacco use.   --Cessation advised.  Hallucinations, visual and auditory --No further hallucinations today.  Medication non-compliance --Will need to stress compliance with treatment and follow-up again before discharge.  For questions or updates, please contact [place] Please consult [email or web address] for contact info under        Signed, Arvil Chaco, PA-C  07/14/2019, 9:47 AM

## 2019-07-14 NOTE — Progress Notes (Signed)
ANTICOAGULATION CONSULT NOTE - Initial Consult  Pharmacy Consult for Heparin Indication: chest pain/ACS  Allergies  Allergen Reactions  . Penicillins Hives   Patient Measurements: Height: 5\' 5"  (165.1 cm) Weight: 182 lb 1.6 oz (82.6 kg) IBW/kg (Calculated) : 61.5 Heparin Dosing Weight: 78.3Kg  Vital Signs: Temp: 98 F (36.7 C) (07/29 0418) Temp Source: Oral (07/29 0418) BP: 110/71 (07/29 0418) Pulse Rate: 86 (07/29 0418)  Labs: Recent Labs    07/12/19 0745 07/12/19 0956 07/12/19 1138 07/12/19 1406  07/13/19 0213  07/13/19 1600 07/13/19 2233 07/14/19 0418  HGB 14.9  --   --   --   --  14.5  --   --   --  12.6*  HCT 41.1  --   --   --   --  40.8  --   --   --  36.8*  PLT 204  --   --   --   --  208  --   --   --  166  APTT  --  28  --   --   --   --   --   --   --   --   LABPROT  --  12.7  --   --   --   --   --   --   --   --   INR  --  1.0  --   --   --   --   --   --   --   --   HEPARINUNFRC  --   --   --   --    < > 0.34   < > 0.29* 0.36 0.43  CREATININE 0.44*  --   --   --   --  0.47*  --   --   --  0.60*  TROPONINIHS 53*  --  56* 58*  --   --   --   --   --   --    < > = values in this interval not displayed.   Estimated Creatinine Clearance: 112.9 mL/min (A) (by C-G formula based on SCr of 0.6 mg/dL (L)).  Medical History: Past Medical History:  Diagnosis Date  . CAD (coronary artery disease)    a. 01/2011 Anterior STEMI/Cath/PCI: LM nl, LAD 100d (3.5x37mm Vision BMS placed), LCX 74m, RI 50, RCA min irregs, EF 40% w/ apical, inferoapical HK.  . Cardiac arrest - ventricular fibrillation    a. In setting of STEMI 01/2011  . Cocaine abuse ([place])   . ETOH abuse    a. 6-12 beers/day  . Hemorrhoids   . Hypertension   . Ischemic cardiomyopathy    a. 06/2011 Echo: EF 45-50%, No rwma  . Marijuana abuse   . Tobacco abuse    a. 1/2 ppd x 26 yrs   Medications:  Medications Prior to Admission  Medication Sig Dispense Refill Last Dose  . gabapentin (NEURONTIN)  100 MG capsule Take 1 capsule (100 mg total) by mouth 3 (three) times daily. 60 capsule 0 Past Week at Unknown time  . omeprazole (PRILOSEC OTC) 20 MG tablet Take 1 tablet (20 mg total) by mouth daily. 28 tablet 1 Past Week at Unknown time  . predniSONE (DELTASONE) 20 MG tablet 3 tabs po day one, then 2 tabs daily x 4 days 11 tablet 0 Past Week at Unknown time  . dicyclomine (BENTYL) 10 MG capsule Take 1 capsule (10 mg total) by mouth 3 (three) times daily  as needed for up to 14 days for spasms. or abdominal pain 30 capsule 0   . feeding supplement, ENSURE ENLIVE, (ENSURE ENLIVE) LIQD Take 237 mLs by mouth 2 (two) times daily between meals. 90 Bottle 0   . Multiple Vitamin (MULTIVITAMIN WITH MINERALS) TABS tablet Take 1 tablet by mouth daily. (Patient not taking: Reported on 07/12/2019) 180 tablet 0 Not Taking at Unknown time  . omeprazole (PRILOSEC) 20 MG capsule Take 1 capsule (20 mg total) by mouth 2 (two) times daily. 60 capsule 1   . ondansetron (ZOFRAN ODT) 4 MG disintegrating tablet Allow 1-2 tablets to dissolve in your mouth every 8 hours as needed for nausea/vomiting (Patient not taking: Reported on 07/12/2019) 30 tablet 0 Completed Course at Unknown time  . sucralfate (CARAFATE) 1 g tablet Take 1 tablet (1 g total) by mouth 4 (four) times daily as needed (for abdominal discomfort, nausea, and/or vomiting). (Patient not taking: Reported on 07/12/2019) 30 tablet 1 Not Taking at Unknown time  . traMADol (ULTRAM) 50 MG tablet Take 1 tablet (50 mg total) by mouth every 6 (six) hours as needed. (Patient not taking: Reported on 07/12/2019) 6 tablet 0 Completed Course at Unknown time   Assessment: Patient presents to the ED via EMS for chest pain x 3 days.  Patient reports chest pain is so severe that he had vomiting yesterday.  Patient has history of 3 previous heart attacks.  Patient has history of IV drug use and reports using cocaine 3 days ago.  Patient describes pain as pressure.  EMS gave patient 1  nitro spray and 4 baby aspirin.    7/27 HL@1755 : <0.10. Called and asked nurse if line was stopped for any reason. Nurse stated that patient did pull IV out and began chewing line but at most it was stopped for 10 minutes. 7/28 0213 HL 0.34 7/28 0755 HL 0.23: Will increase rate to 1350 unit/hr.  7/28 1600 HL 0.29: Will increase rate to 1500 units/hr.  Goal of Therapy:  Heparin level 0.3-0.7 units/ml Monitor platelets by anticoagulation protocol: Yes   Plan:  07/29 @ 0400 HL 0.43 therapeutic. Will continue current rate and will recheck HL w/ am labs. CBC has been stable, but this am Hgb/Hct dropped by 2 units. Per RN patient not bleeding, probably dilutional, RN says patient has been getting fluids, but have stopped, but patient has been drinking fluids.  Tobie Lords, PharmD Clinical Pharmacist 07/14/2019 5:13 AM

## 2019-07-14 NOTE — Plan of Care (Signed)
  Problem: Education: Goal: Knowledge of General Education information will improve Description: Including pain rating scale, medication(s)/side effects and non-pharmacologic comfort measures Outcome: Progressing   Problem: Clinical Measurements: Goal: Will remain free from infection Outcome: Progressing Goal: Respiratory complications will improve Outcome: Progressing   Problem: Activity: Goal: Risk for activity intolerance will decrease Outcome: Progressing   Problem: Nutrition: Goal: Adequate nutrition will be maintained Outcome: Progressing   Problem: Pain Managment: Goal: General experience of comfort will improve Outcome: Not Progressing Note: Pt still complains of pain despite PRN pain meds.

## 2019-07-15 LAB — CBC
HCT: 36.2 % — ABNORMAL LOW (ref 39.0–52.0)
Hemoglobin: 12.6 g/dL — ABNORMAL LOW (ref 13.0–17.0)
MCH: 38.8 pg — ABNORMAL HIGH (ref 26.0–34.0)
MCHC: 34.8 g/dL (ref 30.0–36.0)
MCV: 111.4 fL — ABNORMAL HIGH (ref 80.0–100.0)
Platelets: 151 10*3/uL (ref 150–400)
RBC: 3.25 MIL/uL — ABNORMAL LOW (ref 4.22–5.81)
RDW: 12.9 % (ref 11.5–15.5)
WBC: 6.9 10*3/uL (ref 4.0–10.5)
nRBC: 0 % (ref 0.0–0.2)

## 2019-07-15 LAB — BASIC METABOLIC PANEL
Anion gap: 7 (ref 5–15)
BUN: 6 mg/dL (ref 6–20)
CO2: 21 mmol/L — ABNORMAL LOW (ref 22–32)
Calcium: 8.8 mg/dL — ABNORMAL LOW (ref 8.9–10.3)
Chloride: 109 mmol/L (ref 98–111)
Creatinine, Ser: 0.51 mg/dL — ABNORMAL LOW (ref 0.61–1.24)
GFR calc Af Amer: >60 mL/min (ref >60)
GFR calc non Af Amer: >60 mL/min (ref >60)
Glucose, Bld: 107 mg/dL — ABNORMAL HIGH (ref 70–99)
Potassium: 4 mmol/L (ref 3.5–5.1)
Sodium: 137 mmol/L (ref 135–145)

## 2019-07-15 LAB — GLUCOSE, CAPILLARY
Glucose-Capillary: 94 mg/dL (ref 70–99)
Glucose-Capillary: 111 mg/dL — ABNORMAL HIGH (ref 70–99)

## 2019-07-15 MED ORDER — LISINOPRIL 2.5 MG PO TABS: 2.5000 mg | ORAL_TABLET | Freq: Every day | ORAL | 0 refills | Status: DC

## 2019-07-15 MED ORDER — CARVEDILOL 6.25 MG PO TABS: 6.2500 mg | ORAL_TABLET | Freq: Two times a day (BID) | ORAL | 0 refills | Status: DC

## 2019-07-15 MED ORDER — ASPIRIN 81 MG PO TBEC: 81.0000 mg | DELAYED_RELEASE_TABLET | Freq: Every day | ORAL | 0 refills | Status: AC

## 2019-07-15 MED ORDER — NICOTINE 21 MG/24HR TD PT24: 21.0000 mg | MEDICATED_PATCH | Freq: Every day | TRANSDERMAL | 0 refills | Status: DC

## 2019-07-15 MED ORDER — FOLIC ACID 1 MG PO TABS: 1.0000 mg | ORAL_TABLET | Freq: Every day | ORAL | 0 refills | Status: AC

## 2019-07-15 MED ORDER — THIAMINE HCL 100 MG PO TABS: 100.0000 mg | ORAL_TABLET | Freq: Every day | ORAL | 0 refills | Status: AC

## 2019-07-15 MED ORDER — ISOSORBIDE MONONITRATE ER 30 MG PO TB24: 30.0000 mg | ORAL_TABLET | Freq: Every day | ORAL | 0 refills | Status: DC

## 2019-07-15 MED ORDER — NITROGLYCERIN 0.4 MG SL SUBL: 0.4000 mg | SUBLINGUAL_TABLET | SUBLINGUAL | 12 refills | Status: DC | PRN
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
## 2019-07-15 NOTE — Plan of Care (Signed)

## 2019-07-15 NOTE — Discharge Summary (Signed)
[facility] [place] NAME: Ethan Wells    MR#:  [id number]  DATE OF BIRTH:  06/01/1971  DATE OF ADMISSION:  07/12/2019 ADMITTING PHYSICIAN: Nicholes Mango, MD  DATE OF DISCHARGE: 07/15/2019  PRIMARY CARE PHYSICIAN: Medicine, [facility]   ADMISSION DIAGNOSIS:  Elevated troponin [R79.89] Chest pain, unspecified type [R07.9]  DISCHARGE DIAGNOSIS:  Active Problems:   Chest pain   Ischemic cardiomyopathy   Polysubstance abuse ([place]) Coronary artery disease Tobacco abuse Substance abuse Atypical chest pain SECONDARY DIAGNOSIS:   Past Medical History:  Diagnosis Date  . CAD (coronary artery disease)    a. 01/2011 Anterior STEMI/Cath/PCI: LM nl, LAD 100d (3.5x10mm Vision BMS placed), LCX 66m, RI 50, RCA min irregs, EF 40% w/ apical, inferoapical HK.  . Cardiac arrest - ventricular fibrillation    a. In setting of STEMI 01/2011  . Cocaine abuse ([place])   . ETOH abuse    a. 6-12 beers/day  . Hemorrhoids   . Hypertension   . Ischemic cardiomyopathy    a. 06/2011 Echo: EF 45-50%, No rwma  . Marijuana abuse   . Tobacco abuse    a. 1/2 ppd x 26 yrs     ADMITTING HISTORY Ethan Wells  is a 48 y.o. male with a known history of coronary artery disease,status post stent placement sees [facility] cardiology, history of ischemic cardiomyopathy, hypertension continues to smoke, drink and uses cocaine and marijuana has been having 2-day history of chest pain which has been gradually getting worse.  Patient came into the ED. initial troponin at 53 but as patient is complaining of severe chest pain patient is started on heparin drip by ED physician for unstable angina.  Patient states that he smokes marijuana and cocaine, smokes cigarettes and drinks alcohol to cope with his depression.  Patient is requesting morphine again for his chest pain during my examination  HOSPITAL COURSE:  Patient was admitted to telemetry.. Serial troponins were  trended.  Patient was seen by cardiology during the hospitalization.  Potassium was low and was replaced aggressively.  CIWA protocol was put for alcohol abuse.  Patient received thiamine folic acid supplements.  Tobacco cessation and substance abuse counseling was given to the patient.  Patient was worked up with cardiac stress test which did not show any ischemia.  Patient will be discharged home on aspirin, beta-blocker, nitrates and ACE inhibitor.  During the hospitalization patient was anticoagulated with IV heparin drip.  Patient was worked up with echocardiogram which showed moderate hypokinesis of the left ventricular and anterior wall.  EF of 45 to 50%. Cardiac stress test No significant ischemia Normal wall motion EF of 50% Low risk scan  CONSULTS OBTAINED:    DRUG ALLERGIES:   Allergies  Allergen Reactions  . Penicillins Hives    DISCHARGE MEDICATIONS:   Allergies as of 07/15/2019      Reactions   Penicillins Hives      Medication List    STOP taking these medications   multivitamin with minerals Tabs tablet   omeprazole 20 MG capsule Commonly known as: PriLOSEC   ondansetron 4 MG disintegrating tablet Commonly known as: Zofran ODT   predniSONE 20 MG tablet Commonly known as: DELTASONE   sucralfate 1 g tablet Commonly known as: Carafate   traMADol 50 MG tablet Commonly known as: Ultram     TAKE these medications   aspirin 81 MG EC tablet Take 1 tablet (81 mg total) by mouth daily. Start taking on:  July 16, 2019   carvedilol 6.25 MG tablet Commonly known as: COREG Take 1 tablet (6.25 mg total) by mouth 2 (two) times daily with a meal.   dicyclomine 10 MG capsule Commonly known as: Bentyl Take 1 capsule (10 mg total) by mouth 3 (three) times daily as needed for up to 14 days for spasms. or abdominal pain   feeding supplement (ENSURE ENLIVE) Liqd Take 237 mLs by mouth 2 (two) times daily between meals.   folic acid 1 MG tablet Commonly known as:  FOLVITE Take 1 tablet (1 mg total) by mouth daily. Start taking on: July 16, 2019   gabapentin 100 MG capsule Commonly known as: NEURONTIN Take 1 capsule (100 mg total) by mouth 3 (three) times daily.   isosorbide mononitrate 30 MG 24 hr tablet Commonly known as: IMDUR Take 1 tablet (30 mg total) by mouth daily. Start taking on: July 16, 2019   lisinopril 2.5 MG tablet Commonly known as: ZESTRIL Take 1 tablet (2.5 mg total) by mouth daily. Start taking on: July 16, 2019   nicotine 21 mg/24hr patch Commonly known as: NICODERM CQ - dosed in mg/24 hours Place 1 patch (21 mg total) onto the skin daily. Start taking on: July 16, 2019   nitroGLYCERIN 0.4 MG SL tablet Commonly known as: NITROSTAT Place 1 tablet (0.4 mg total) under the tongue every 5 (five) minutes as needed for chest pain.   omeprazole 20 MG tablet Commonly known as: PriLOSEC OTC Take 1 tablet (20 mg total) by mouth daily.   thiamine 100 MG tablet Take 1 tablet (100 mg total) by mouth daily. Start taking on: July 16, 2019       Today  Patient seen and evaluated today No chest pain No shortness of breath Tolerating diet well Hemodynamically stable  VITAL SIGNS:  Blood pressure 110/75, pulse 84, temperature 98.1 F (36.7 C), temperature source Oral, resp. rate 18, height 5\' 5"  (1.651 m), weight 82.6 kg, SpO2 98 %.  I/O:    Intake/Output Summary (Last 24 hours) at 07/15/2019 1300 Last data filed at 07/15/2019 0422 Gross per 24 hour  Intake 1476.5 ml  Output 1275 ml  Net 201.5 ml    PHYSICAL EXAMINATION:  Physical Exam  GENERAL:  48 y.o.-year-old patient lying in the bed with no acute distress.  LUNGS: Normal breath sounds bilaterally, no wheezing, rales,rhonchi or crepitation. No use of accessory muscles of respiration.  CARDIOVASCULAR: S1, S2 normal. No murmurs, rubs, or gallops.  ABDOMEN: Soft, non-tender, non-distended. Bowel sounds present. No organomegaly or mass.  NEUROLOGIC: Moves all 4  extremities. PSYCHIATRIC: The patient is alert and oriented x 3.  SKIN: No obvious rash, lesion, or ulcer.   DATA REVIEW:   CBC Recent Labs  Lab 07/15/19 0434  WBC 6.9  HGB 12.6*  HCT 36.2*  PLT 151    Chemistries  Recent Labs  Lab 07/12/19 0745  07/14/19 0418 07/15/19 0434  NA 131*   < > 137 137  K 3.0*   < > 3.2* 4.0  CL 99   < > 107 109  CO2 22   < > 20* 21*  GLUCOSE 133*   < > 93 107*  BUN 8   < > 7 6  CREATININE 0.44*   < > 0.60* 0.51*  CALCIUM 8.6*   < > 8.5* 8.8*  MG  --    < > 1.9  --   AST 90*  --   --   --  ALT 52*  --   --   --   ALKPHOS 119  --   --   --   BILITOT 3.1*  --   --   --    < > = values in this interval not displayed.    Cardiac Enzymes No results for input(s): TROPONINI in the last 168 hours.  Microbiology Results  Results for orders placed or performed during the hospital encounter of 07/12/19  SARS Coronavirus 2 (CEPHEID- Performed in [place] hospital lab), Hosp Order     Status: None   Collection Time: 07/12/19  9:55 AM   Specimen: Nasopharyngeal Swab  Result Value Ref Range Status   SARS Coronavirus 2 NEGATIVE NEGATIVE Final    Comment: (NOTE) If result is NEGATIVE SARS-CoV-2 target nucleic acids are NOT DETECTED. The SARS-CoV-2 RNA is generally detectable in upper and lower  respiratory specimens during the acute phase of infection. The lowest  concentration of SARS-CoV-2 viral copies this assay can detect is 250  copies / mL. A negative result does not preclude SARS-CoV-2 infection  and should not be used as the sole basis for treatment or other  patient management decisions.  A negative result may occur with  improper specimen collection / handling, submission of specimen other  than nasopharyngeal swab, presence of viral mutation(s) within the  areas targeted by this assay, and inadequate number of viral copies  (<250 copies / mL). A negative result must be combined with clinical  observations, patient history, and  epidemiological information. If result is POSITIVE SARS-CoV-2 target nucleic acids are DETECTED. The SARS-CoV-2 RNA is generally detectable in upper and lower  respiratory specimens dur ing the acute phase of infection.  Positive  results are indicative of active infection with SARS-CoV-2.  Clinical  correlation with patient history and other diagnostic information is  necessary to determine patient infection status.  Positive results do  not rule out bacterial infection or co-infection with other viruses. If result is PRESUMPTIVE POSTIVE SARS-CoV-2 nucleic acids MAY BE PRESENT.   A presumptive positive result was obtained on the submitted specimen  and confirmed on repeat testing.  While 2019 novel coronavirus  (SARS-CoV-2) nucleic acids may be present in the submitted sample  additional confirmatory testing may be necessary for epidemiological  and / or clinical management purposes  to differentiate between  SARS-CoV-2 and other Sarbecovirus currently known to infect humans.  If clinically indicated additional testing with an alternate test  methodology [phone]) is advised. The SARS-CoV-2 RNA is generally  detectable in upper and lower respiratory sp ecimens during the acute  phase of infection. The expected result is Negative. Fact Sheet for Patients:  StrictlyIdeas.no Fact Sheet for Healthcare Providers: BankingDealers.co.za This test is not yet approved or cleared by the [place] FDA and has been authorized for detection and/or diagnosis of SARS-CoV-2 by FDA under an Emergency Use Authorization (EUA).  This EUA will remain in effect (meaning this test can be used) for the duration of the COVID-19 declaration under Section 564(b)(1) of the Act, 21 U.S.C. section 360bbb-3(b)(1), unless the authorization is terminated or revoked sooner. Performed at [facility], [address] [place], [place] [address]      RADIOLOGY:  Nm Myocar Multi W/spect Tamela Oddi Motion / Ef  Result Date: 07/14/2019 Pharmacological myocardial perfusion imaging study with no significant  Ischemia Unable to exclude small region mild fixed defect anterior wall consistent with prior MI Grossly normal wall motion, unable to exclude mild hypokinesis anteroseptal region,  EF estimated at 50% No EKG changes concerning for ischemia at peak stress or in recovery. Low risk scan Signed, Esmond Plants, MD, Ph.D [facility]    Follow up with PCP in 1 week.  Management plans discussed with the patient, family and they are in agreement.  CODE STATUS: Full code    Code Status Orders  (From admission, onward)         Start     Ordered   07/12/19 1246  Full code  Continuous     07/12/19 1245        Code Status History    Date Active Date Inactive Code Status Order ID Comments User Context   05/25/2018 0819 05/27/2018 1553 Full Code [id number]  Arta Silence, MD Inpatient   Advance Care Planning Activity      TOTAL TIME TAKING CARE OF THIS PATIENT ON DAY OF DISCHARGE: more than 35 minutes.   Saundra Shelling M.D on 07/15/2019 at 1:00 PM  Between 7am to 6pm - Pager - [phone]  After 6pm go to [email or web address] - password [email or web address] [facility]  Office  [phone]  CC: Primary care physician; [facility]  Note: This dictation was prepared with Dragon dictation along with smaller phrase technology. Any transcriptional errors that result from this process are unintentional.

## 2020-04-24 ENCOUNTER — Encounter: Payer: Self-pay | Admitting: *Deleted

## 2020-04-24 ENCOUNTER — Other Ambulatory Visit: Payer: Self-pay

## 2020-04-24 DIAGNOSIS — I2510 Atherosclerotic heart disease of native coronary artery without angina pectoris: Secondary | ICD-10-CM | POA: Insufficient documentation

## 2020-04-24 DIAGNOSIS — Z79899 Other long term (current) drug therapy: Secondary | ICD-10-CM | POA: Diagnosis not present

## 2020-04-24 DIAGNOSIS — R109 Unspecified abdominal pain: Secondary | ICD-10-CM | POA: Diagnosis not present

## 2020-04-24 DIAGNOSIS — R05 Cough: Secondary | ICD-10-CM | POA: Diagnosis not present

## 2020-04-24 DIAGNOSIS — I10 Essential (primary) hypertension: Secondary | ICD-10-CM | POA: Diagnosis not present

## 2020-04-24 DIAGNOSIS — F17210 Nicotine dependence, cigarettes, uncomplicated: Secondary | ICD-10-CM | POA: Insufficient documentation

## 2020-04-24 DIAGNOSIS — K5900 Constipation, unspecified: Secondary | ICD-10-CM | POA: Insufficient documentation

## 2020-04-24 DIAGNOSIS — R1084 Generalized abdominal pain: Secondary | ICD-10-CM | POA: Diagnosis not present

## 2020-04-24 LAB — COMPREHENSIVE METABOLIC PANEL
ALT: 31 U/L (ref 0–44)
AST: 51 U/L — ABNORMAL HIGH (ref 15–41)
Albumin: 3.2 g/dL — ABNORMAL LOW (ref 3.5–5.0)
Alkaline Phosphatase: 134 U/L — ABNORMAL HIGH (ref 38–126)
Anion gap: 11 (ref 5–15)
BUN: <5 mg/dL — ABNORMAL LOW (ref 6–20)
CO2: 23 mmol/L (ref 22–32)
Calcium: 8.7 mg/dL — ABNORMAL LOW (ref 8.9–10.3)
Chloride: 99 mmol/L (ref 98–111)
Creatinine, Ser: 0.7 mg/dL (ref 0.61–1.24)
GFR calc Af Amer: >60 mL/min (ref >60)
GFR calc non Af Amer: >60 mL/min (ref >60)
Glucose, Bld: 124 mg/dL — ABNORMAL HIGH (ref 70–99)
Potassium: 3.7 mmol/L (ref 3.5–5.1)
Sodium: 133 mmol/L — ABNORMAL LOW (ref 135–145)
Total Bilirubin: 1.1 mg/dL (ref 0.3–1.2)
Total Protein: 7.3 g/dL (ref 6.5–8.1)

## 2020-04-24 LAB — CBC
HCT: 43.6 % (ref 39.0–52.0)
Hemoglobin: 16.2 g/dL (ref 13.0–17.0)
MCH: 38.2 pg — ABNORMAL HIGH (ref 26.0–34.0)
MCHC: 37.2 g/dL — ABNORMAL HIGH (ref 30.0–36.0)
MCV: 102.8 fL — ABNORMAL HIGH (ref 80.0–100.0)
Platelets: 189 10*3/uL (ref 150–400)
RBC: 4.24 MIL/uL (ref 4.22–5.81)
RDW: 15.3 % (ref 11.5–15.5)
WBC: 9.6 10*3/uL (ref 4.0–10.5)
nRBC: 0 % (ref 0.0–0.2)

## 2020-04-24 LAB — TROPONIN I (HIGH SENSITIVITY): Troponin I (High Sensitivity): 4 ng/L (ref <18)

## 2020-04-24 LAB — LIPASE, BLOOD: Lipase: 22 U/L (ref 11–51)

## 2020-04-24 MED ORDER — SODIUM CHLORIDE 0.9% FLUSH: 3.0000 mL | Freq: Once | INTRAVENOUS | Status: DC

## 2020-04-24 NOTE — ED Triage Notes (Signed)
Pt reports abd pain for 2 days and back pain.  Vomited x 1   Loose stools today.   Drainage from both eyes for 3 weeks.  Pt alert  Speech clear.

## 2020-04-25 ENCOUNTER — Emergency Department
Admission: EM | Admit: 2020-04-25 | Discharge: 2020-04-25 | Disposition: A | Discharge status: Home / Self Care | Payer: Medicare HMO | Attending: Emergency Medicine | Admitting: Emergency Medicine

## 2020-04-25 ENCOUNTER — Encounter: Payer: Self-pay | Admitting: Radiology

## 2020-04-25 ENCOUNTER — Emergency Department: Payer: Medicare HMO

## 2020-04-25 DIAGNOSIS — I10 Essential (primary) hypertension: Secondary | ICD-10-CM | POA: Diagnosis not present

## 2020-04-25 DIAGNOSIS — R109 Unspecified abdominal pain: Secondary | ICD-10-CM | POA: Diagnosis not present

## 2020-04-25 DIAGNOSIS — R1084 Generalized abdominal pain: Secondary | ICD-10-CM

## 2020-04-25 DIAGNOSIS — K5900 Constipation, unspecified: Secondary | ICD-10-CM | POA: Diagnosis not present

## 2020-04-25 DIAGNOSIS — R05 Cough: Secondary | ICD-10-CM | POA: Diagnosis not present

## 2020-04-25 LAB — URINALYSIS, COMPLETE (UACMP) WITH MICROSCOPIC
Bacteria, UA: NONE SEEN
Bilirubin Urine: NEGATIVE
Glucose, UA: NEGATIVE mg/dL
Hgb urine dipstick: NEGATIVE
Ketones, ur: NEGATIVE mg/dL
Leukocytes,Ua: NEGATIVE
Nitrite: NEGATIVE
Protein, ur: NEGATIVE mg/dL
Specific Gravity, Urine: 1.02 (ref 1.005–1.030)
WBC, UA: NONE SEEN WBC/hpf (ref 0–5)
pH: 7 (ref 5.0–8.0)

## 2020-04-25 LAB — TROPONIN I (HIGH SENSITIVITY): Troponin I (High Sensitivity): 4 ng/L (ref <18)

## 2020-04-25 LAB — ETHANOL: Alcohol, Ethyl (B): <10 mg/dL (ref <10)

## 2020-04-25 IMAGING — CR DG CHEST 2V
2 series · 2 of 2 positions shown · non-contrast
Comparison: [DATE]

CLINICAL DATA: Cough, chronic back and abdominal pain, COPD

EXAM:
CHEST - 2 VIEW

[chest pa]
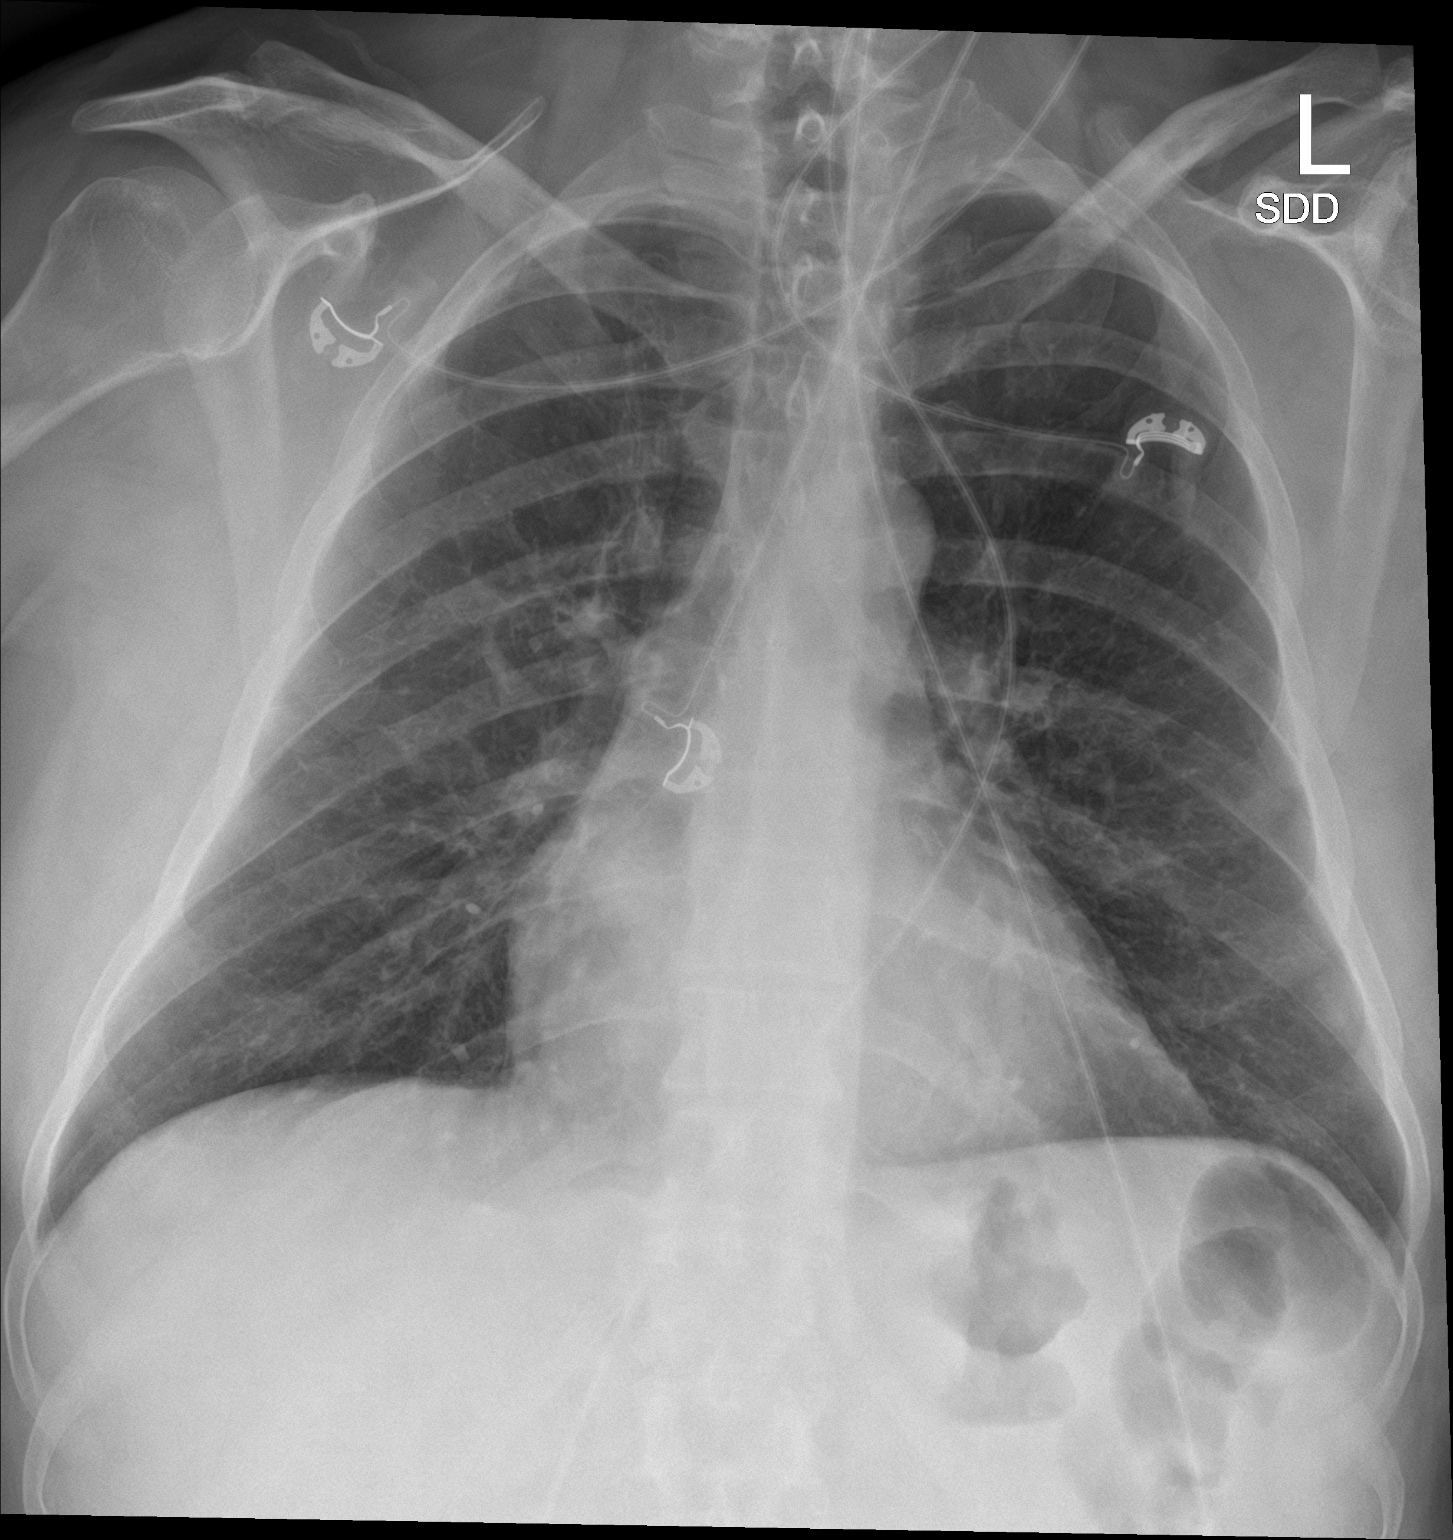

[chest lat]
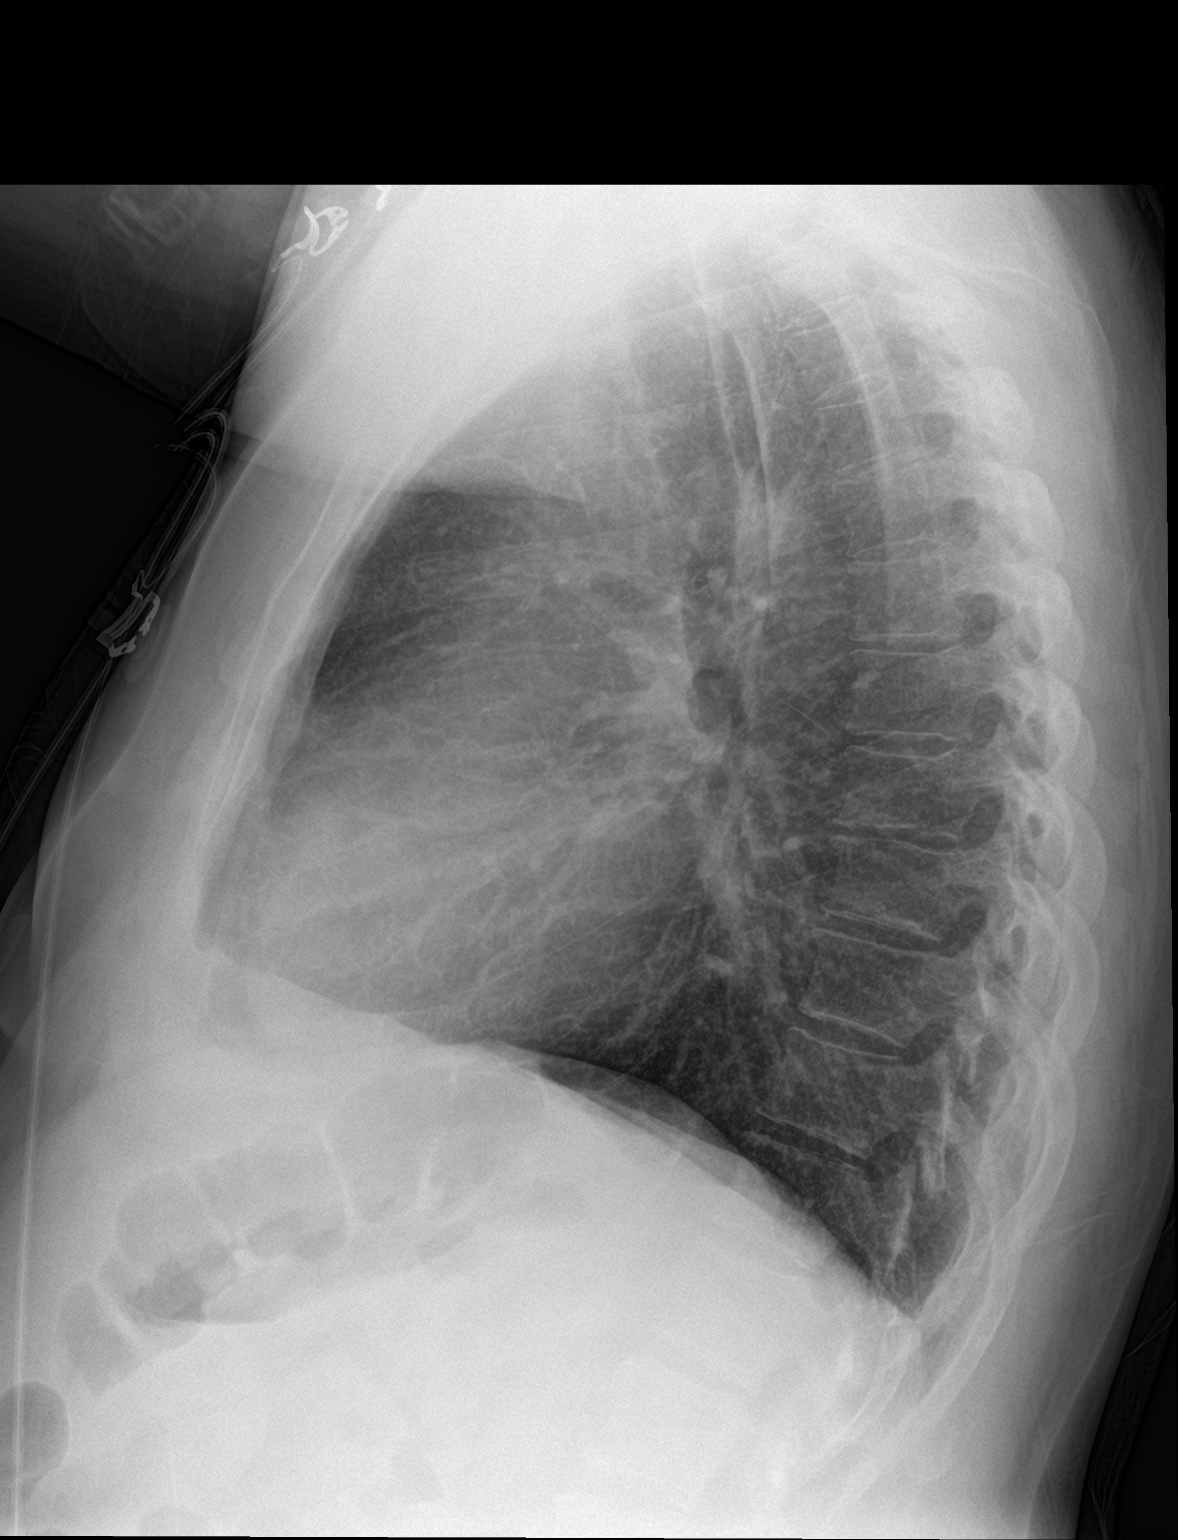

[2 of 2 positions shown; findings below may reference images not displayed]

FINDINGS: Frontal and lateral views of the chest demonstrate an unremarkable
cardiac silhouette. Chronic background emphysema without airspace
disease, effusion, or pneumothorax. No acute bony abnormalities.
IMPRESSION: 1. Emphysema, no acute process.

## 2020-04-25 IMAGING — CT CT ABD-PELV W/ CM
2 of 5 series · 17 of 46 positions shown, 19 images · IV contrast (omnipaque)
Comparison: [DATE]

CLINICAL DATA: Diffuse abdominal pain

EXAM:
CT ABDOMEN AND PELVIS WITH CONTRAST
TECHNIQUE: Multidetector CT imaging of the abdomen and pelvis was performed
using the standard protocol following bolus administration of
intravenous contrast.
CONTRAST:  100mL OMNIPAQUE IOHEXOL 300 MG/ML  SOLN

[Series 2: routine abd/pel with · axial · 0.84mm/px · z∈[-899,-499]mm · 14 of 90 slices shown, 16 images]
[im 5/90  soft-tissue]
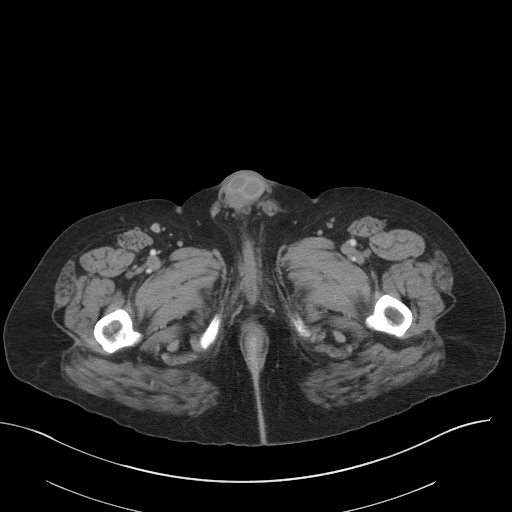
[im 5/90  bone]
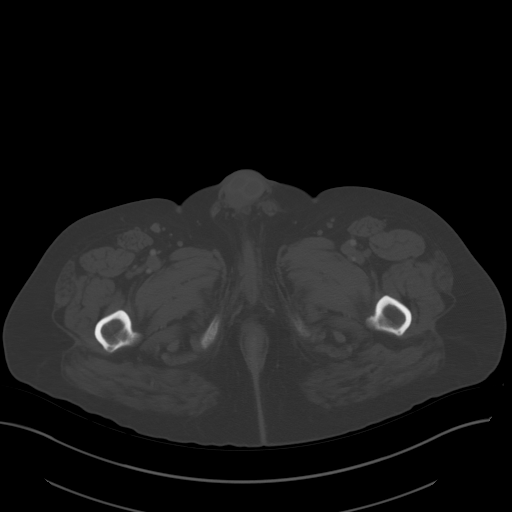
[im 10/90  soft-tissue]
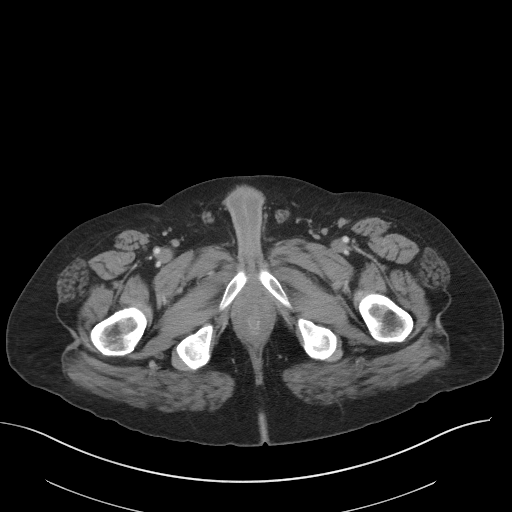
[im 20/90  soft-tissue]
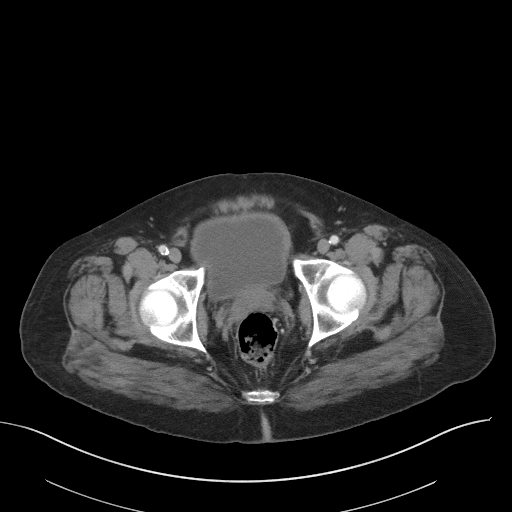
[im 25/90  soft-tissue]
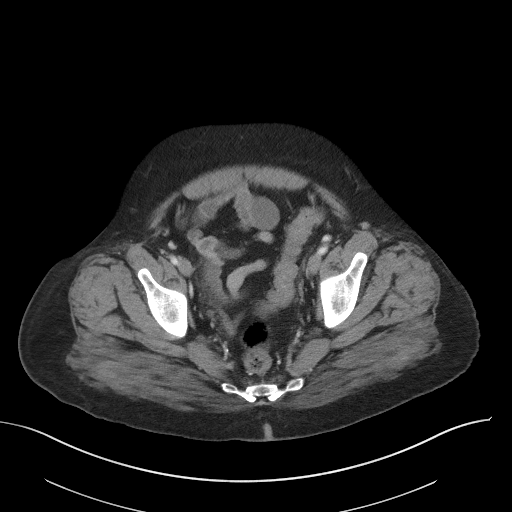
[im 30/90  soft-tissue]
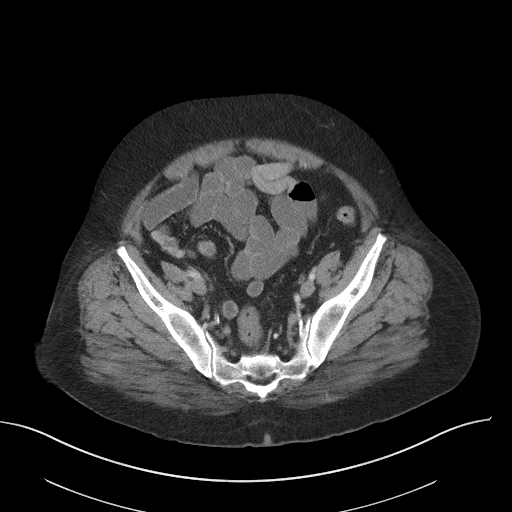
[im 35/90  soft-tissue]
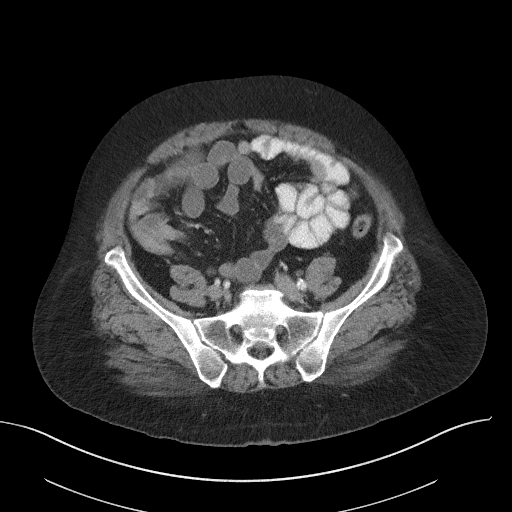
[im 40/90  soft-tissue]
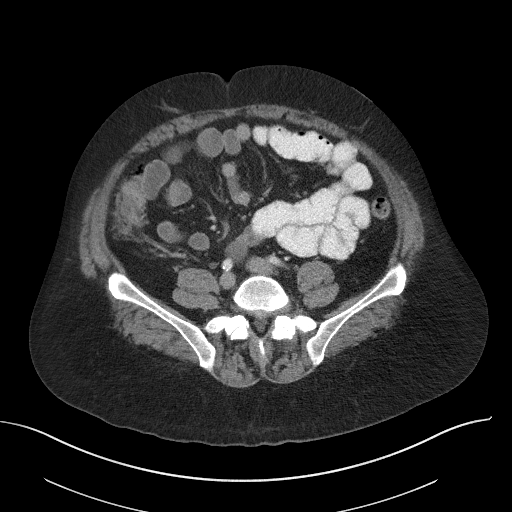
[im 50/90  soft-tissue]
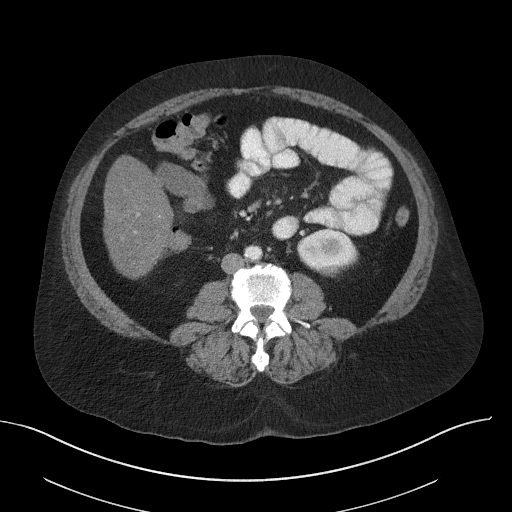
[im 55/90  soft-tissue]
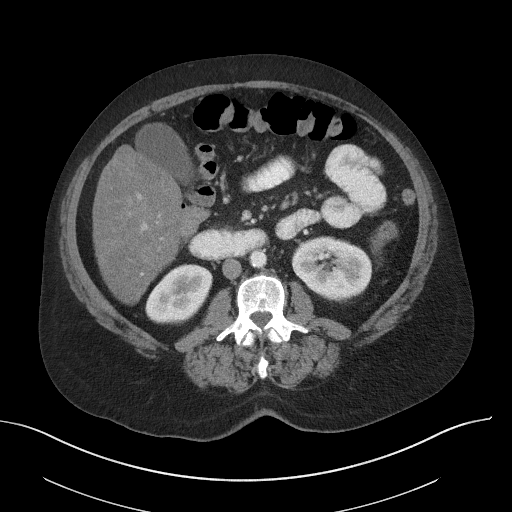
[im 55/90  bone]
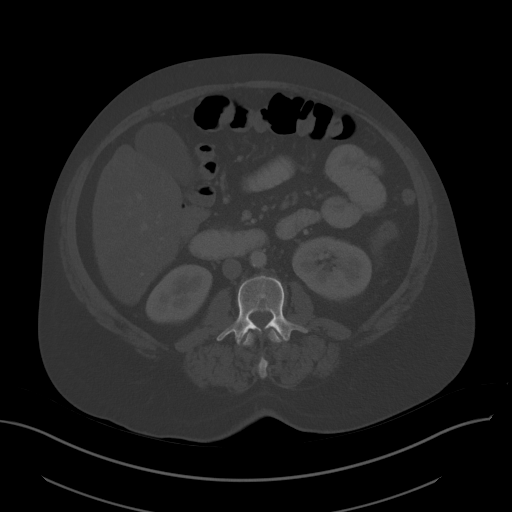
[im 60/90  soft-tissue]
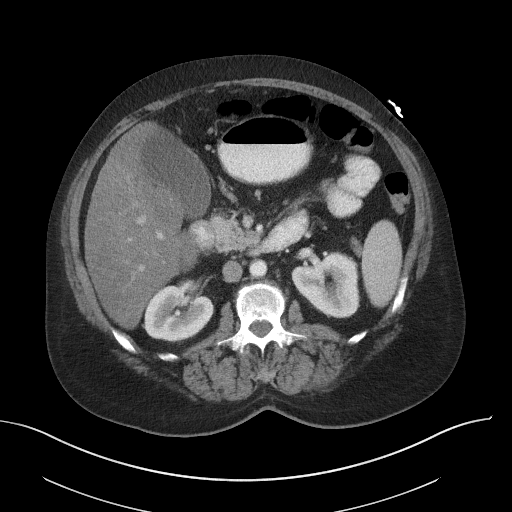
[im 65/90  soft-tissue]
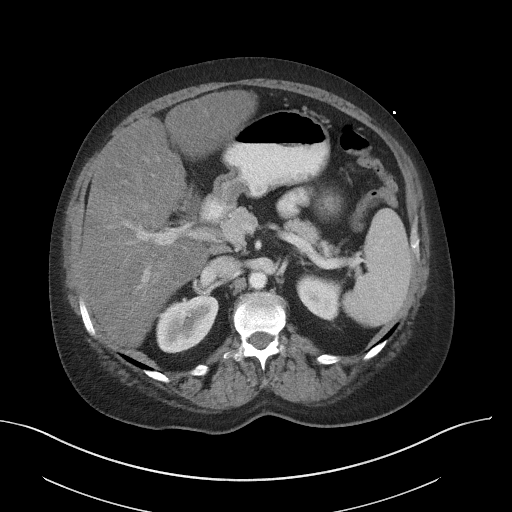
[im 70/90  soft-tissue]
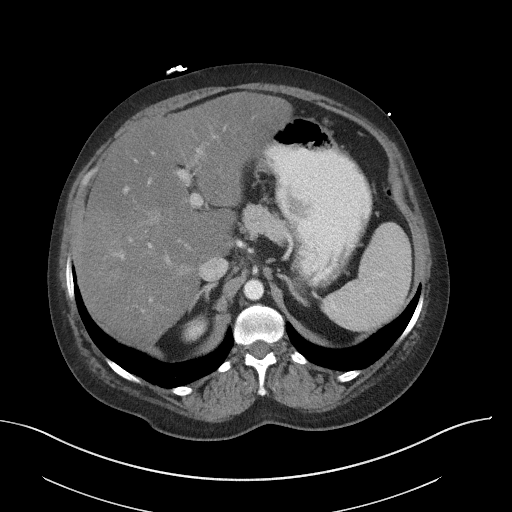
[im 80/90  soft-tissue]
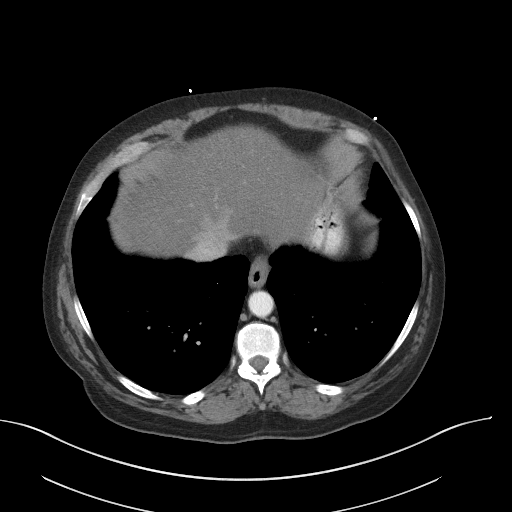
[im 85/90  soft-tissue]
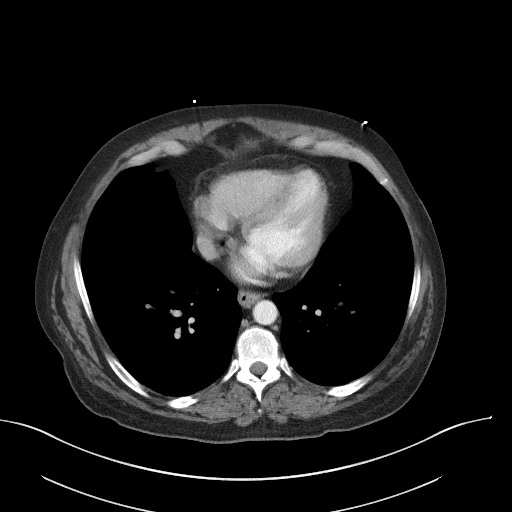

[Series 5: coronal st · coronal · 0.83mm/px · 3 of 111 slices shown]
[im 37/111  soft-tissue]
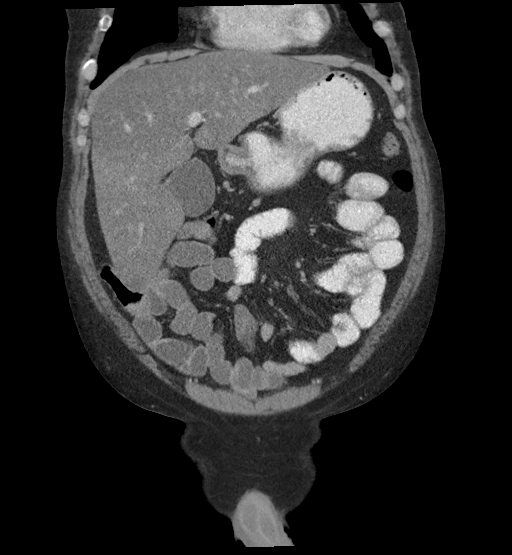
[im 49/111  soft-tissue]
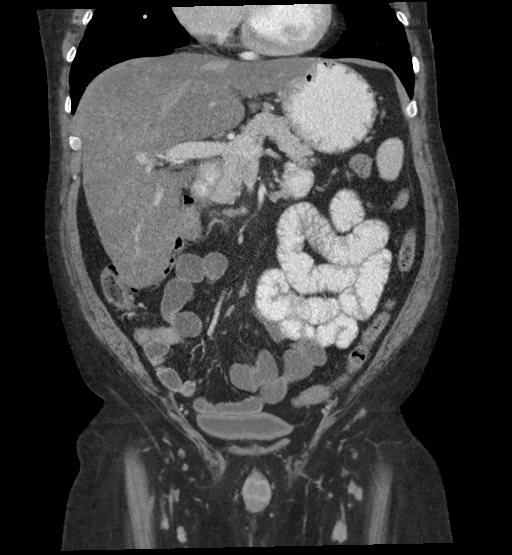
[im 62/111  soft-tissue]
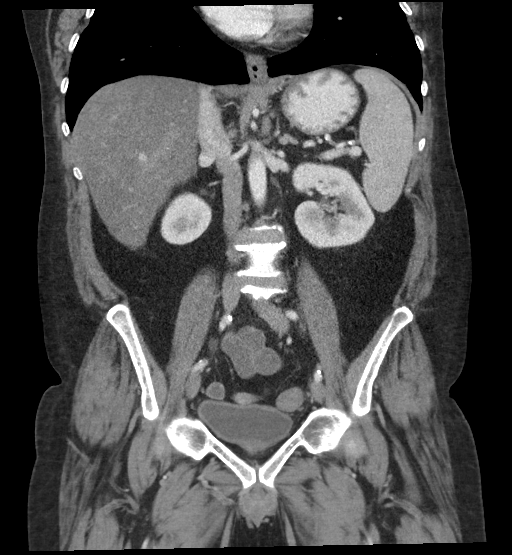

[17 of 46 positions shown; findings below may reference images not displayed]

FINDINGS: Lower chest: Subendocardial fatty density at the left ventricular
apex. Fatty Bochdalek's hernia on the right.

Hepatobiliary: Marked hepatic steatosis. The caudate lobe is large
and there is mild surface lobulation. No focal liver lesion.No
evidence of biliary obstruction or stone.

Pancreas: Unremarkable.

Spleen: Unremarkable.

Adrenals/Urinary Tract: Negative adrenals. No hydronephrosis or
stone. Unremarkable bladder.

Stomach/Bowel: Terminal ileal loops are decompressed but overall no
obstructive pattern. No visible bowel inflammation.

Vascular/Lymphatic: No acute vascular abnormality. Premature
atherosclerotic calcification of the aorta and iliacs. No mass or
adenopathy.

Reproductive:No pathologic findings.

Other: No ascites or pneumoperitoneum.

Musculoskeletal: No acute abnormalities.
IMPRESSION: 1. No acute finding.
2. Hepatic steatosis and suspected cirrhosis.
3. Remote subendocardial infarct at the left ventricular apex.

## 2020-04-25 MED ORDER — IOHEXOL 300 MG/ML  SOLN
100.0000 mL | Freq: Once | INTRAMUSCULAR | Status: AC | PRN
  Administered 2020-04-25: 04:00:00 100 mL via INTRAVENOUS

## 2020-04-25 MED ORDER — IOHEXOL 9 MG/ML PO SOLN
500.0000 mL | Freq: Two times a day (BID) | ORAL | Status: DC | PRN
  Administered 2020-04-25: 500 mL via ORAL

## 2020-04-25 MED ORDER — DICYCLOMINE HCL 20 MG PO TABS
20.0000 mg | ORAL_TABLET | Freq: Four times a day (QID) | ORAL | 0 refills | Status: DC
Start: 2020-04-25 — End: 2020-06-04

## 2020-04-25 MED ORDER — SODIUM CHLORIDE 0.9 % IV BOLUS
1000.0000 mL | Freq: Once | INTRAVENOUS | Status: AC
  Administered 2020-04-25: 04:00:00 1000 mL via INTRAVENOUS

## 2020-04-25 MED ORDER — ONDANSETRON HCL 4 MG/2ML IJ SOLN
4.0000 mg | Freq: Once | INTRAMUSCULAR | Status: AC
  Administered 2020-04-25: 04:00:00 4 mg via INTRAVENOUS
  Filled 2020-04-25: qty 2

## 2020-04-25 MED ORDER — FAMOTIDINE IN NACL 20-0.9 MG/50ML-% IV SOLN
20.0000 mg | Freq: Once | INTRAVENOUS | Status: AC
  Administered 2020-04-25: 04:00:00 20 mg via INTRAVENOUS
  Filled 2020-04-25: qty 50

## 2020-04-25 MED ORDER — MORPHINE SULFATE (PF) 4 MG/ML IV SOLN
4.0000 mg | Freq: Once | INTRAVENOUS | Status: AC
  Administered 2020-04-25: 04:00:00 4 mg via INTRAVENOUS
  Filled 2020-04-25: qty 1

## 2020-04-25 MED ORDER — DICYCLOMINE HCL 20 MG PO TABS
20.0000 mg | ORAL_TABLET | Freq: Once | ORAL | Status: AC
  Administered 2020-04-25: 07:00:00 20 mg via ORAL
  Filled 2020-04-25: qty 1

## 2020-04-25 MED ORDER — LACTULOSE 10 GM/15ML PO SOLN
20.0000 g | Freq: Every day | ORAL | 0 refills | Status: DC | PRN
Start: 2020-04-25 — End: 2020-06-04

## 2020-04-25 NOTE — ED Notes (Signed)
Pt requesting "pain medication so I can sleep." Provider notified. Pt reporting 10/10 pain to back

## 2020-04-25 NOTE — Discharge Instructions (Signed)
1.  You may take Bentyl as needed for abdominal discomfort. 2.  Take Lactulose as needed for bowel movements. 3.  Return to the ER for worsening symptoms, persistent vomiting, difficulty breathing or other concerns.

## 2020-04-25 NOTE — ED Notes (Signed)
Pt states he is going to try calling his mother for a ride home

## 2020-04-25 NOTE — ED Provider Notes (Signed)
[facility] Emergency Department Provider Note   ____________________________________________   First MD Initiated Contact with Patient 04/25/20 [phone]     (approximate)  I have reviewed the triage vital signs and the nursing notes.   HISTORY  Chief Complaint Abdominal Pain    HPI Ethan Wells is a 49 y.o. male who presents to the ED from home with a chief complaint of abdominal pain.  Patient reports a 3-day history of generalized abdominal pain, maximally in the upper quadrants which she describes as being punched in the gut.  Associated with midthoracic back pain and mild nonproductive cough.  Denies fever, chills, chest pain, dysuria or diarrhea.  Vomited one time yesterday.  Denies recent travel, trauma or hormone use.  Denies known COVID-19 exposure.       Past Medical History:  Diagnosis Date  . CAD (coronary artery disease)    a. 01/2011 Anterior STEMI/Cath/PCI: LM nl, LAD 100d (3.5x3mm Vision BMS placed), LCX 59m, RI 50, RCA min irregs, EF 40% w/ apical, inferoapical HK.  . Cardiac arrest - ventricular fibrillation    a. In setting of STEMI 01/2011  . Cocaine abuse ([place])   . ETOH abuse    a. 6-12 beers/day  . Hemorrhoids   . Hypertension   . Ischemic cardiomyopathy    a. 06/2011 Echo: EF 45-50%, No rwma  . Marijuana abuse   . Tobacco abuse    a. 1/2 ppd x 26 yrs    Patient Active Problem List   Diagnosis Date Noted  . Ischemic cardiomyopathy   . Polysubstance abuse ([place])   . Chest pain 07/12/2019  . Abdominal pain, RUQ 05/25/2018  . Chest pain, mid sternal 11/17/2012  . Cocaine abuse ([place])   . Marijuana abuse   . TOBACCO ABUSE 02/26/2011  . HYPERTENSION, BENIGN 02/26/2011  . CAD, NATIVE VESSEL 02/26/2011  . Other specified forms of chronic ischemic heart disease 02/26/2011    No past surgical history on file.  Prior to Admission medications   Medication Sig Start Date End Date Taking? Authorizing Provider  carvedilol  (COREG) 6.25 MG tablet Take 1 tablet (6.25 mg total) by mouth 2 (two) times daily with a meal. 07/15/19 08/14/19  Pyreddy, Reatha Harps, MD  dicyclomine (BENTYL) 20 MG tablet Take 1 tablet (20 mg total) by mouth every 6 (six) hours. 04/25/20   Paulette Blanch, MD  feeding supplement, ENSURE ENLIVE, (ENSURE ENLIVE) LIQD Take 237 mLs by mouth 2 (two) times daily between meals. 05/27/18   Salary, Avel Peace, MD  gabapentin (NEURONTIN) 100 MG capsule Take 1 capsule (100 mg total) by mouth 3 (three) times daily. 06/23/19   Domenic Moras, PA-C  isosorbide mononitrate (IMDUR) 30 MG 24 hr tablet Take 1 tablet (30 mg total) by mouth daily. 07/16/19 08/15/19  Saundra Shelling, MD  lactulose (CHRONULAC) 10 GM/15ML solution Take 30 mLs (20 g total) by mouth daily as needed for mild constipation. 04/25/20   Paulette Blanch, MD  lisinopril (ZESTRIL) 2.5 MG tablet Take 1 tablet (2.5 mg total) by mouth daily. 07/16/19 08/15/19  Saundra Shelling, MD  nicotine (NICODERM CQ - DOSED IN MG/24 HOURS) 21 mg/24hr patch Place 1 patch (21 mg total) onto the skin daily. 07/16/19   Saundra Shelling, MD  nitroGLYCERIN (NITROSTAT) 0.4 MG SL tablet Place 1 tablet (0.4 mg total) under the tongue every 5 (five) minutes as needed for chest pain. 07/15/19   Saundra Shelling, MD  omeprazole (PRILOSEC OTC) 20 MG tablet Take 1 tablet (  20 mg total) by mouth daily. 06/17/19 06/16/20  Hinda Kehr, MD    Allergies Penicillins  Family History  Problem Relation Age of Onset  . Coronary artery disease Mother        alive  . Other Other        2 sisters alive and well  . Alcohol abuse Father        died @ 34    Social History Social History   Tobacco Use  . Smoking status: Current Every Day Smoker    Packs/day: 0.50    Years: 26.00    Pack years: 13.00    Types: Cigarettes  . Smokeless tobacco: Never Used  Substance Use Topics  . Alcohol use: Yes    Comment: at least a 6 pack of beer daily and often a 12 pack  . Drug use: Yes    Types: Cocaine, Marijuana     Comment: denies cocaine; marijuana 2 days ago    Review of Systems  Constitutional: No fever/chills Eyes: No visual changes. ENT: No sore throat. Cardiovascular: Denies chest pain. Respiratory: Denies shortness of breath. Gastrointestinal: Positive for abdominal pain.  No nausea, no vomiting.  No diarrhea.  No constipation. Genitourinary: Negative for dysuria. Musculoskeletal: Negative for back pain. Skin: Negative for rash. Neurological: Negative for headaches, focal weakness or numbness.   ____________________________________________   PHYSICAL EXAM:  VITAL SIGNS: ED Triage Vitals  Enc Vitals Group     BP 04/24/20 1949 (!) 146/95     Pulse Rate 04/24/20 1949 95     Resp 04/24/20 1949 18     Temp 04/24/20 1949 98.1 F (36.7 C)     Temp Source 04/24/20 1949 Oral     SpO2 04/24/20 1949 98 %     Weight 04/24/20 1950 180 lb (81.6 kg)     Height 04/24/20 1950 5\' 5"  (1.651 m)     Head Circumference --      Peak Flow --      Pain Score 04/24/20 1957 8     Pain Loc --      Pain Edu? --      Excl. in [place]? --     Constitutional: Alert and oriented. Well appearing and in no acute distress. Eyes: Conjunctivae are normal. PERRL. EOMI. Head: Atraumatic. Nose: No congestion/rhinnorhea. Mouth/Throat: Mucous membranes are moist.  Oropharynx non-erythematous. Neck: No stridor.   Cardiovascular: Normal rate, regular rhythm. Grossly normal heart sounds.  Good peripheral circulation. Respiratory: Normal respiratory effort.  No retractions. Lungs CTAB. Gastrointestinal: Soft with mild diffuse tenderness to palpation without rebound or guarding. No distention. No abdominal bruits. No CVA tenderness. Musculoskeletal: No lower extremity tenderness nor edema.  No joint effusions. Neurologic:  Normal speech and language. No gross focal neurologic deficits are appreciated. No gait instability. Skin:  Skin is warm, dry and intact. No rash noted. Psychiatric: Mood and affect are normal.  Speech and behavior are normal.  ____________________________________________   LABS (all labs ordered are listed, but only abnormal results are displayed)  Labs Reviewed  COMPREHENSIVE METABOLIC PANEL - Abnormal; Notable for the following components:      Result Value   Sodium 133 (*)    Glucose, Bld 124 (*)    BUN <5 (*)    Calcium 8.7 (*)    Albumin 3.2 (*)    AST 51 (*)    Alkaline Phosphatase 134 (*)    All other components within normal limits  CBC - Abnormal; Notable for the following  components:   MCV 102.8 (*)    MCH 38.2 (*)    MCHC 37.2 (*)    All other components within normal limits  URINALYSIS, COMPLETE (UACMP) WITH MICROSCOPIC - Abnormal; Notable for the following components:   Color, Urine YELLOW (*)    APPearance CLEAR (*)    All other components within normal limits  LIPASE, BLOOD  ETHANOL  TROPONIN I (HIGH SENSITIVITY)  TROPONIN I (HIGH SENSITIVITY)   ____________________________________________  EKG  ED ECG REPORT I, Deaaron Fulghum J, the attending physician, personally viewed and interpreted this ECG.   Date: 04/25/2020  EKG Time: 1951  Rate: 87  Rhythm: normal EKG, normal sinus rhythm  Axis: Normal  Intervals:none  ST&T Change: Nonspecific  ____________________________________________  RADIOLOGY  ED MD interpretation: No acute cardiopulmonary process; unremarkable CT abdomen/pelvis; I personally viewed the CT scan and see moderate stool burden  Official radiology report(s): DG Chest 2 View  Result Date: 04/25/2020 CLINICAL DATA:  Cough, chronic back and abdominal pain, COPD EXAM: CHEST - 2 VIEW COMPARISON:  07/12/2019 FINDINGS: Frontal and lateral views of the chest demonstrate an unremarkable cardiac silhouette. Chronic background emphysema without airspace disease, effusion, or pneumothorax. No acute bony abnormalities. IMPRESSION: 1. Emphysema, no acute process. Electronically Signed   By: Randa Ngo M.D.   On: 04/25/2020 03:41   CT  Abdomen Pelvis W Contrast  Result Date: 04/25/2020 CLINICAL DATA:  Diffuse abdominal pain EXAM: CT ABDOMEN AND PELVIS WITH CONTRAST TECHNIQUE: Multidetector CT imaging of the abdomen and pelvis was performed using the standard protocol following bolus administration of intravenous contrast. CONTRAST:  188mL OMNIPAQUE IOHEXOL 300 MG/ML  SOLN COMPARISON:  04/19/2018 FINDINGS: Lower chest: Subendocardial fatty density at the left ventricular apex. Fatty Bochdalek's hernia on the right. Hepatobiliary: Marked hepatic steatosis. The caudate lobe is large and there is mild surface lobulation. No focal liver lesion.No evidence of biliary obstruction or stone. Pancreas: Unremarkable. Spleen: Unremarkable. Adrenals/Urinary Tract: Negative adrenals. No hydronephrosis or stone. Unremarkable bladder. Stomach/Bowel: Terminal ileal loops are decompressed but overall no obstructive pattern. No visible bowel inflammation. Vascular/Lymphatic: No acute vascular abnormality. Premature atherosclerotic calcification of the aorta and iliacs. No mass or adenopathy. Reproductive:No pathologic findings. Other: No ascites or pneumoperitoneum. Musculoskeletal: No acute abnormalities. IMPRESSION: 1. No acute finding. 2. Hepatic steatosis and suspected cirrhosis. 3. Remote subendocardial infarct at the left ventricular apex. Electronically Signed   By: Monte Fantasia M.D.   On: 04/25/2020 04:42    ____________________________________________   PROCEDURES  Procedure(s) performed (including Critical Care):  Procedures   ____________________________________________   INITIAL IMPRESSION / ASSESSMENT AND PLAN / ED COURSE  As part of my medical decision making, I reviewed the following data within the [facility] notes reviewed and incorporated, Labs reviewed, EKG interpreted, Old chart reviewed, Radiograph reviewed and Notes from prior ED visits     Ethan Wells was evaluated in Emergency  Department on 04/25/2020 for the symptoms described in the history of present illness. He was evaluated in the context of the global COVID-19 pandemic, which necessitated consideration that the patient might be at risk for infection with the SARS-CoV-2 virus that causes COVID-19. Institutional protocols and algorithms that pertain to the evaluation of patients at risk for COVID-19 are in a state of rapid change based on information released by regulatory bodies including the CDC and federal and state organizations. These policies and algorithms were followed during the patient's care in the ED.    49 year old male presenting with abdominal and  back pain. Differential diagnosis includes, but is not limited to, biliary disease (biliary colic, acute cholecystitis, cholangitis, choledocholithiasis, etc), intrathoracic causes for epigastric abdominal pain including ACS, gastritis, duodenitis, pancreatitis, small bowel or large bowel obstruction, abdominal aortic aneurysm, hernia, and ulcer(s).  Laboratory results unremarkable.  Will obtain CT abdomen/pelvis to evaluate etiology of patient's pain.  Initiate IV morphine for pain, IV Zofran for nausea and IV Pepcid.  Will reassess.   Clinical Course as of Apr 26 639  Tue Apr 25, 2020  0536 Updated patient of CT imaging and laboratory results thus far.  Awaiting results of UA.   [JS]  [id number] Updated patient on UA result.  Strict return precautions given.  Patient verbalizes understanding agrees with plan of care.   [JS]    Clinical Course User Index [JS] Paulette Blanch, MD     ____________________________________________   FINAL CLINICAL IMPRESSION(S) / ED DIAGNOSES  Final diagnoses:  Generalized abdominal pain  Constipation, unspecified constipation type     ED Discharge Orders         Ordered    lactulose (CHRONULAC) 10 GM/15ML solution  Daily PRN     04/25/20 0639    dicyclomine (BENTYL) 20 MG tablet  Every 6 hours     04/25/20 0640             Note:  This document was prepared using Dragon voice recognition software and may include unintentional dictation errors.   Paulette Blanch, MD 04/25/20 [phone]

## 2020-04-25 NOTE — ED Notes (Signed)
Pt reports coming in for chronic back pain, abdominal pain and back pain for 3 days along with SOB. Pt states history of COPD.

## 2020-06-04 ENCOUNTER — Emergency Department: Payer: Medicare HMO

## 2020-06-04 ENCOUNTER — Other Ambulatory Visit: Payer: Self-pay

## 2020-06-04 ENCOUNTER — Observation Stay
Admission: EM | Admit: 2020-06-04 | Discharge: 2020-06-06 | Disposition: A | Discharge status: Home / Self Care | Payer: Medicare HMO | Attending: Internal Medicine | Admitting: Internal Medicine

## 2020-06-04 DIAGNOSIS — F1210 Cannabis abuse, uncomplicated: Secondary | ICD-10-CM | POA: Diagnosis not present

## 2020-06-04 DIAGNOSIS — I700 Atherosclerosis of aorta: Secondary | ICD-10-CM | POA: Diagnosis not present

## 2020-06-04 DIAGNOSIS — Z9119 Patient's noncompliance with other medical treatment and regimen: Secondary | ICD-10-CM | POA: Diagnosis not present

## 2020-06-04 DIAGNOSIS — E876 Hypokalemia: Secondary | ICD-10-CM | POA: Insufficient documentation

## 2020-06-04 DIAGNOSIS — Z8674 Personal history of sudden cardiac arrest: Secondary | ICD-10-CM | POA: Diagnosis not present

## 2020-06-04 DIAGNOSIS — R072 Precordial pain: Secondary | ICD-10-CM | POA: Diagnosis not present

## 2020-06-04 DIAGNOSIS — R079 Chest pain, unspecified: Secondary | ICD-10-CM

## 2020-06-04 DIAGNOSIS — D72829 Elevated white blood cell count, unspecified: Secondary | ICD-10-CM | POA: Diagnosis not present

## 2020-06-04 DIAGNOSIS — R0789 Other chest pain: Principal | ICD-10-CM | POA: Insufficient documentation

## 2020-06-04 DIAGNOSIS — Z20822 Contact with and (suspected) exposure to covid-19: Secondary | ICD-10-CM | POA: Diagnosis not present

## 2020-06-04 DIAGNOSIS — I255 Ischemic cardiomyopathy: Secondary | ICD-10-CM | POA: Insufficient documentation

## 2020-06-04 DIAGNOSIS — Z9114 Patient's other noncompliance with medication regimen: Secondary | ICD-10-CM | POA: Insufficient documentation

## 2020-06-04 DIAGNOSIS — R000 Tachycardia, unspecified: Secondary | ICD-10-CM | POA: Diagnosis not present

## 2020-06-04 DIAGNOSIS — Z720 Tobacco use: Secondary | ICD-10-CM | POA: Diagnosis present

## 2020-06-04 DIAGNOSIS — I5022 Chronic systolic (congestive) heart failure: Secondary | ICD-10-CM | POA: Diagnosis not present

## 2020-06-04 DIAGNOSIS — R109 Unspecified abdominal pain: Secondary | ICD-10-CM

## 2020-06-04 DIAGNOSIS — I517 Cardiomegaly: Secondary | ICD-10-CM | POA: Diagnosis not present

## 2020-06-04 DIAGNOSIS — R1013 Epigastric pain: Secondary | ICD-10-CM | POA: Diagnosis not present

## 2020-06-04 DIAGNOSIS — J90 Pleural effusion, not elsewhere classified: Secondary | ICD-10-CM | POA: Diagnosis not present

## 2020-06-04 DIAGNOSIS — E871 Hypo-osmolality and hyponatremia: Secondary | ICD-10-CM | POA: Diagnosis not present

## 2020-06-04 DIAGNOSIS — F1010 Alcohol abuse, uncomplicated: Secondary | ICD-10-CM | POA: Insufficient documentation

## 2020-06-04 DIAGNOSIS — F17210 Nicotine dependence, cigarettes, uncomplicated: Secondary | ICD-10-CM | POA: Diagnosis not present

## 2020-06-04 DIAGNOSIS — Z79899 Other long term (current) drug therapy: Secondary | ICD-10-CM | POA: Diagnosis not present

## 2020-06-04 DIAGNOSIS — Z8249 Family history of ischemic heart disease and other diseases of the circulatory system: Secondary | ICD-10-CM | POA: Insufficient documentation

## 2020-06-04 DIAGNOSIS — F1910 Other psychoactive substance abuse, uncomplicated: Secondary | ICD-10-CM

## 2020-06-04 DIAGNOSIS — Z955 Presence of coronary angioplasty implant and graft: Secondary | ICD-10-CM | POA: Insufficient documentation

## 2020-06-04 DIAGNOSIS — I2510 Atherosclerotic heart disease of native coronary artery without angina pectoris: Secondary | ICD-10-CM | POA: Insufficient documentation

## 2020-06-04 DIAGNOSIS — F1410 Cocaine abuse, uncomplicated: Secondary | ICD-10-CM

## 2020-06-04 DIAGNOSIS — M791 Myalgia, unspecified site: Secondary | ICD-10-CM | POA: Insufficient documentation

## 2020-06-04 DIAGNOSIS — I2489 Other forms of acute ischemic heart disease: Secondary | ICD-10-CM | POA: Diagnosis present

## 2020-06-04 DIAGNOSIS — I25119 Atherosclerotic heart disease of native coronary artery with unspecified angina pectoris: Secondary | ICD-10-CM | POA: Diagnosis present

## 2020-06-04 DIAGNOSIS — I214 Non-ST elevation (NSTEMI) myocardial infarction: Secondary | ICD-10-CM | POA: Diagnosis present

## 2020-06-04 DIAGNOSIS — I10 Essential (primary) hypertension: Secondary | ICD-10-CM | POA: Diagnosis not present

## 2020-06-04 DIAGNOSIS — I252 Old myocardial infarction: Secondary | ICD-10-CM | POA: Diagnosis not present

## 2020-06-04 DIAGNOSIS — F17200 Nicotine dependence, unspecified, uncomplicated: Secondary | ICD-10-CM

## 2020-06-04 DIAGNOSIS — I110 Hypertensive heart disease with heart failure: Secondary | ICD-10-CM | POA: Diagnosis not present

## 2020-06-04 DIAGNOSIS — K760 Fatty (change of) liver, not elsewhere classified: Secondary | ICD-10-CM | POA: Diagnosis not present

## 2020-06-04 DIAGNOSIS — I429 Cardiomyopathy, unspecified: Secondary | ICD-10-CM

## 2020-06-04 DIAGNOSIS — F1510 Other stimulant abuse, uncomplicated: Secondary | ICD-10-CM | POA: Insufficient documentation

## 2020-06-04 DIAGNOSIS — R9431 Abnormal electrocardiogram [ECG] [EKG]: Secondary | ICD-10-CM

## 2020-06-04 LAB — URINE DRUG SCREEN, QUALITATIVE (ARMC ONLY)
Amphetamines, Ur Screen: POSITIVE — AB
Barbiturates, Ur Screen: NOT DETECTED
Benzodiazepine, Ur Scrn: NOT DETECTED
Cannabinoid 50 Ng, Ur ~~LOC~~: POSITIVE — AB
Cocaine Metabolite,Ur ~~LOC~~: POSITIVE — AB
MDMA (Ecstasy)Ur Screen: NOT DETECTED
Methadone Scn, Ur: NOT DETECTED
Opiate, Ur Screen: POSITIVE — AB
Phencyclidine (PCP) Ur S: NOT DETECTED
Tricyclic, Ur Screen: NOT DETECTED

## 2020-06-04 LAB — BASIC METABOLIC PANEL
Anion gap: 17 — ABNORMAL HIGH (ref 5–15)
Anion gap: 9 (ref 5–15)
BUN: <5 mg/dL — ABNORMAL LOW (ref 6–20)
BUN: 5 mg/dL — ABNORMAL LOW (ref 6–20)
CO2: 17 mmol/L — ABNORMAL LOW (ref 22–32)
CO2: 23 mmol/L (ref 22–32)
Calcium: 8.1 mg/dL — ABNORMAL LOW (ref 8.9–10.3)
Calcium: 7.7 mg/dL — ABNORMAL LOW (ref 8.9–10.3)
Chloride: 95 mmol/L — ABNORMAL LOW (ref 98–111)
Chloride: 100 mmol/L (ref 98–111)
Creatinine, Ser: 0.77 mg/dL (ref 0.61–1.24)
Creatinine, Ser: 0.63 mg/dL (ref 0.61–1.24)
GFR calc Af Amer: >60 mL/min (ref >60)
GFR calc Af Amer: >60 mL/min (ref >60)
GFR calc non Af Amer: >60 mL/min (ref >60)
GFR calc non Af Amer: >60 mL/min (ref >60)
Glucose, Bld: 122 mg/dL — ABNORMAL HIGH (ref 70–99)
Glucose, Bld: 116 mg/dL — ABNORMAL HIGH (ref 70–99)
Potassium: 2.6 mmol/L — CL (ref 3.5–5.1)
Potassium: 3.4 mmol/L — ABNORMAL LOW (ref 3.5–5.1)
Sodium: 129 mmol/L — ABNORMAL LOW (ref 135–145)
Sodium: 132 mmol/L — ABNORMAL LOW (ref 135–145)

## 2020-06-04 LAB — HEPATIC FUNCTION PANEL
ALT: 23 U/L (ref 0–44)
AST: 47 U/L — ABNORMAL HIGH (ref 15–41)
Albumin: 2.8 g/dL — ABNORMAL LOW (ref 3.5–5.0)
Alkaline Phosphatase: 163 U/L — ABNORMAL HIGH (ref 38–126)
Bilirubin, Direct: 0.5 mg/dL — ABNORMAL HIGH (ref 0.0–0.2)
Indirect Bilirubin: 1.3 mg/dL — ABNORMAL HIGH (ref 0.3–0.9)
Total Bilirubin: 1.8 mg/dL — ABNORMAL HIGH (ref 0.3–1.2)
Total Protein: 7 g/dL (ref 6.5–8.1)

## 2020-06-04 LAB — URINALYSIS, COMPLETE (UACMP) WITH MICROSCOPIC
Bacteria, UA: NONE SEEN
Bilirubin Urine: NEGATIVE
Glucose, UA: NEGATIVE mg/dL
Hgb urine dipstick: NEGATIVE
Ketones, ur: NEGATIVE mg/dL
Leukocytes,Ua: NEGATIVE
Nitrite: NEGATIVE
Protein, ur: NEGATIVE mg/dL
Specific Gravity, Urine: 1.002 — ABNORMAL LOW (ref 1.005–1.030)
Squamous Epithelial / HPF: NONE SEEN (ref 0–5)
pH: 7 (ref 5.0–8.0)

## 2020-06-04 LAB — CBC
Hemoglobin: 16.2 g/dL (ref 13.0–17.0)
Platelets: 215 10*3/uL (ref 150–400)
WBC: 12.2 10*3/uL — ABNORMAL HIGH (ref 4.0–10.5)
nRBC: 0 % (ref 0.0–0.2)

## 2020-06-04 LAB — OSMOLALITY, URINE: Osmolality, Ur: 85 mOsm/kg — ABNORMAL LOW (ref 300–900)

## 2020-06-04 LAB — TROPONIN I (HIGH SENSITIVITY)
Troponin I (High Sensitivity): 7 ng/L (ref <18)
Troponin I (High Sensitivity): 8 ng/L (ref <18)
Troponin I (High Sensitivity): 9 ng/L (ref <18)
Troponin I (High Sensitivity): 8 ng/L (ref <18)

## 2020-06-04 LAB — OSMOLALITY: Osmolality: 275 mOsm/kg (ref 275–295)

## 2020-06-04 LAB — SODIUM, URINE, RANDOM: Sodium, Ur: 15 mmol/L

## 2020-06-04 LAB — BRAIN NATRIURETIC PEPTIDE: B Natriuretic Peptide: 160.7 pg/mL — ABNORMAL HIGH (ref 0.0–100.0)

## 2020-06-04 LAB — LIPASE, BLOOD: Lipase: 26 U/L (ref 11–51)

## 2020-06-04 LAB — ETHANOL: Alcohol, Ethyl (B): <10 mg/dL (ref <10)

## 2020-06-04 LAB — MAGNESIUM: Magnesium: 1.3 mg/dL — ABNORMAL LOW (ref 1.7–2.4)

## 2020-06-04 LAB — PHOSPHORUS: Phosphorus: 2.4 mg/dL — ABNORMAL LOW (ref 2.5–4.6)

## 2020-06-04 LAB — SARS CORONAVIRUS 2 BY RT PCR (HOSPITAL ORDER, PERFORMED IN ~~LOC~~ HOSPITAL LAB): SARS Coronavirus 2: NEGATIVE

## 2020-06-04 IMAGING — US US ABDOMEN LIMITED
1 series · 14 of 25 positions shown · non-contrast
Comparison: None.

CLINICAL DATA: Epigastric pain.

EXAM:
ULTRASOUND ABDOMEN LIMITED RIGHT UPPER QUADRANT

[Series 1: us abdomen limited ruq · 14 of 84 slices shown]
[im 1/84]
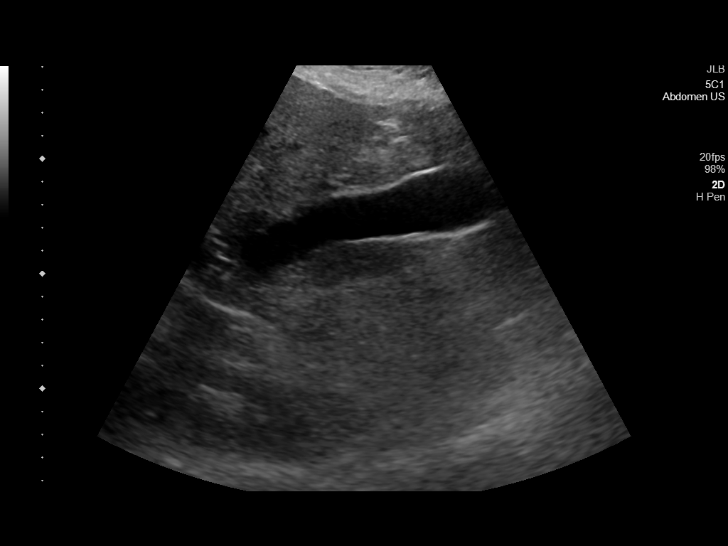
[im 7/84]
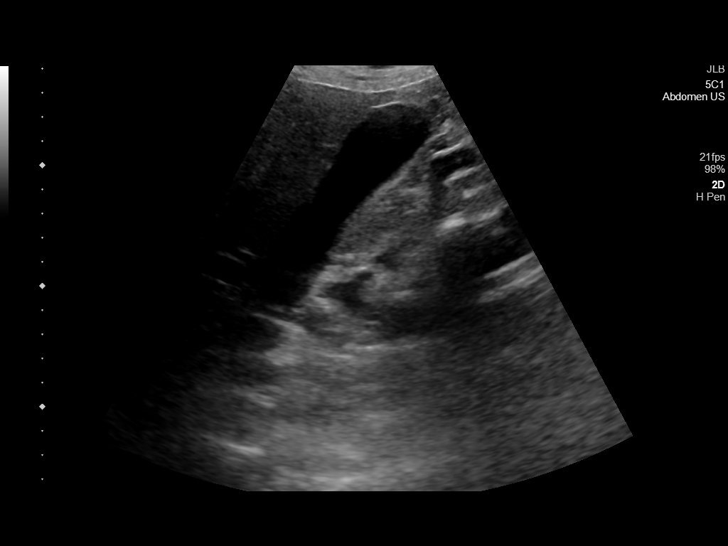
[im 14/84]
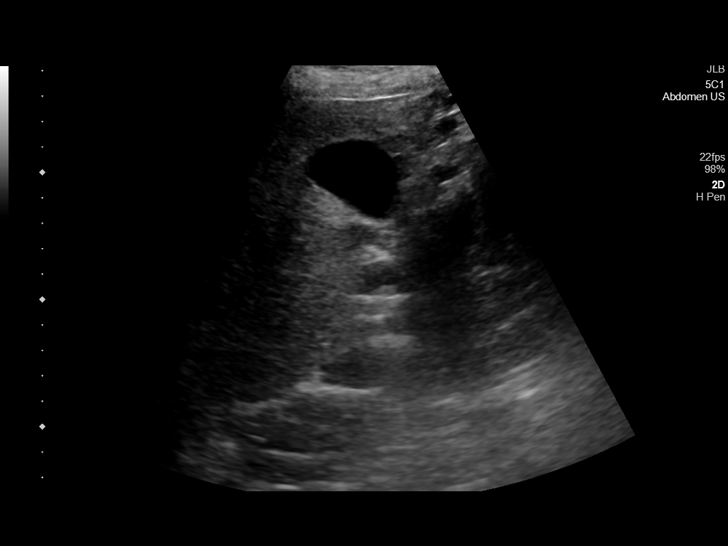
[im 21/84]
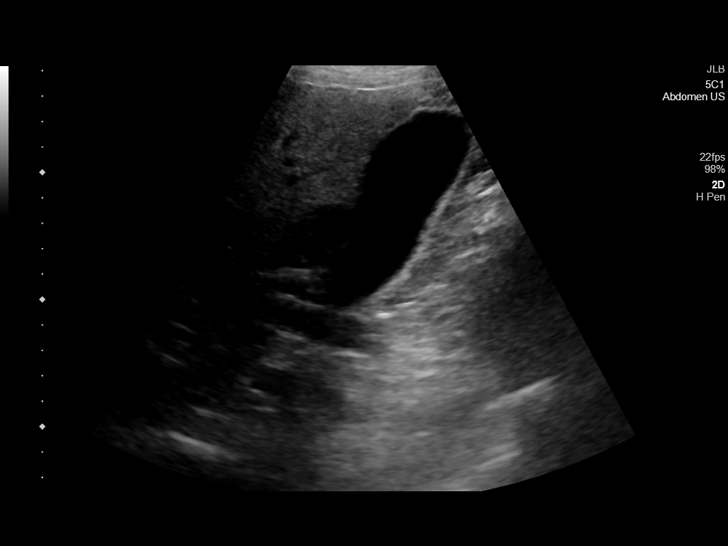
[im 28/84]
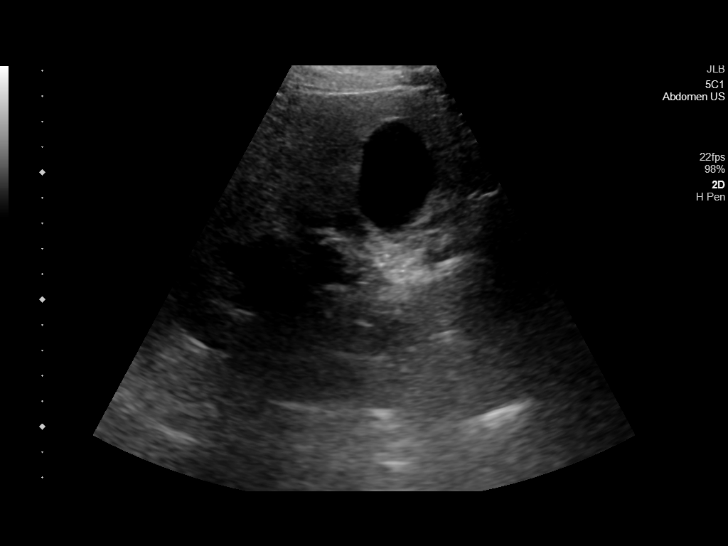
[im 32/84]
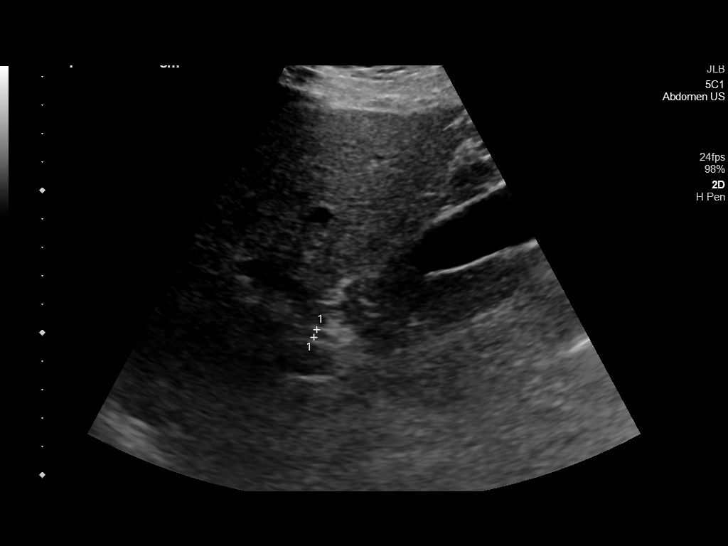
[im 39/84]
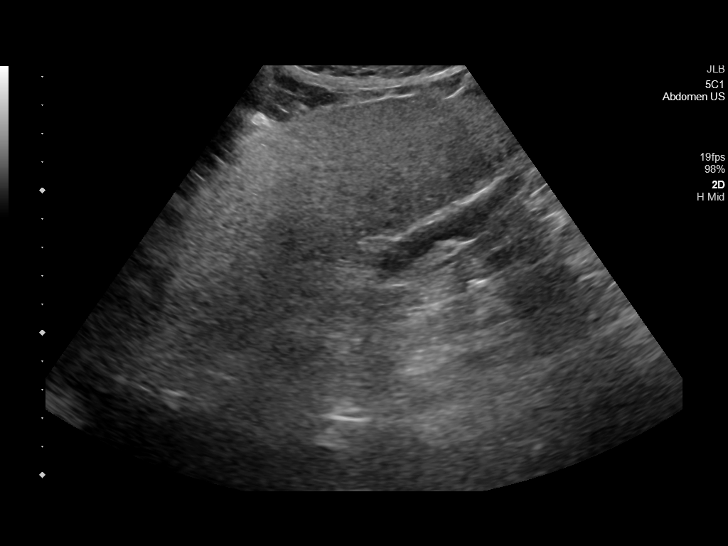
[im 45/84]
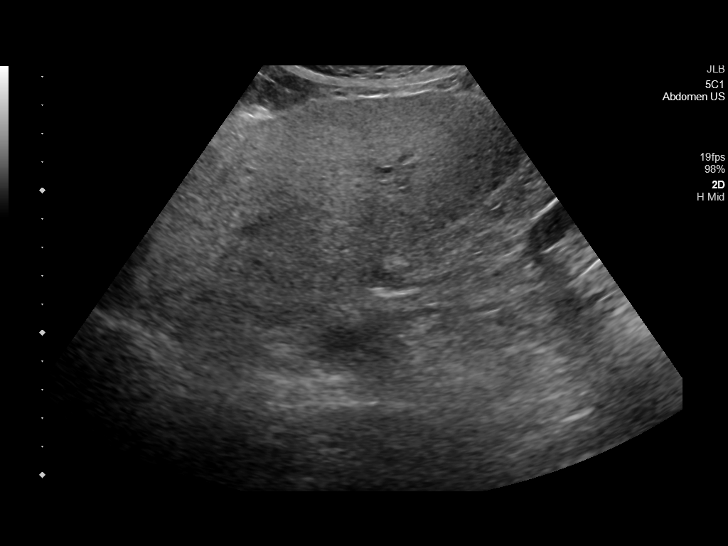
[im 52/84]
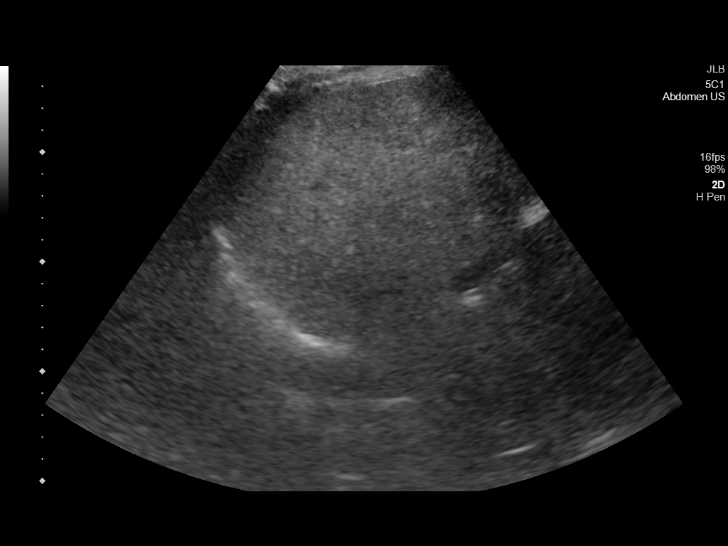
[im 56/84]
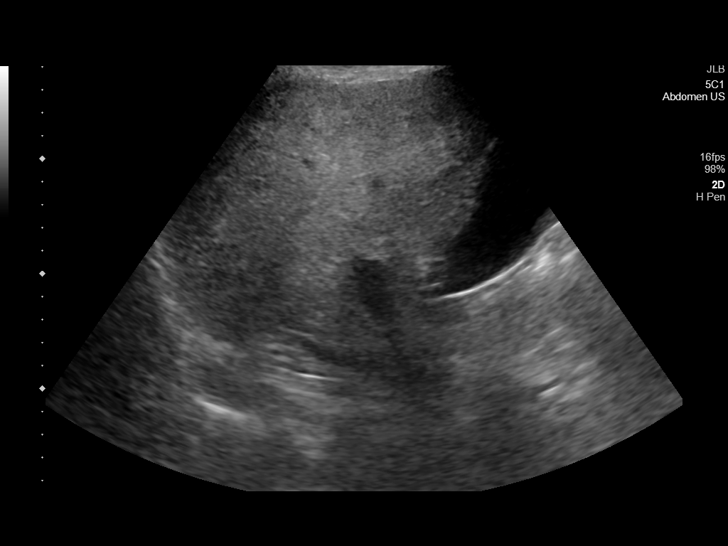
[im 63/84]
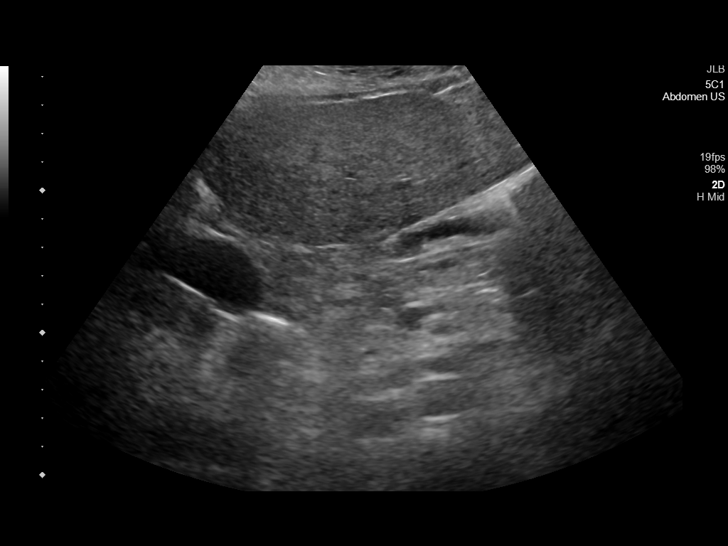
[im 70/84]
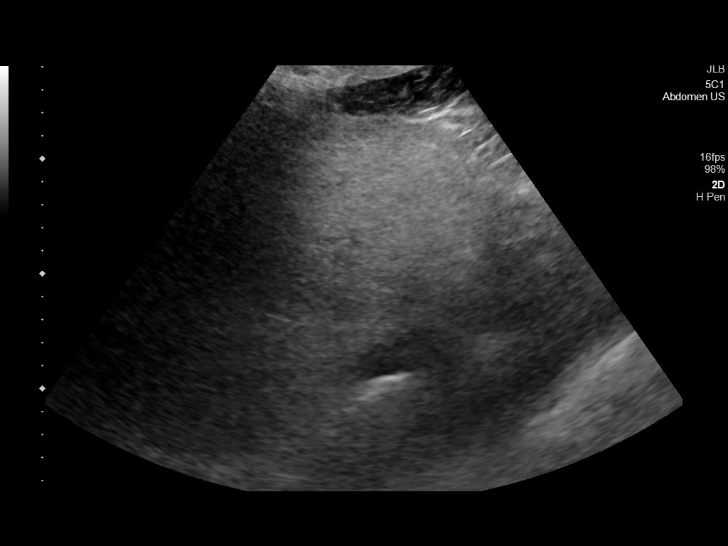
[im 77/84]
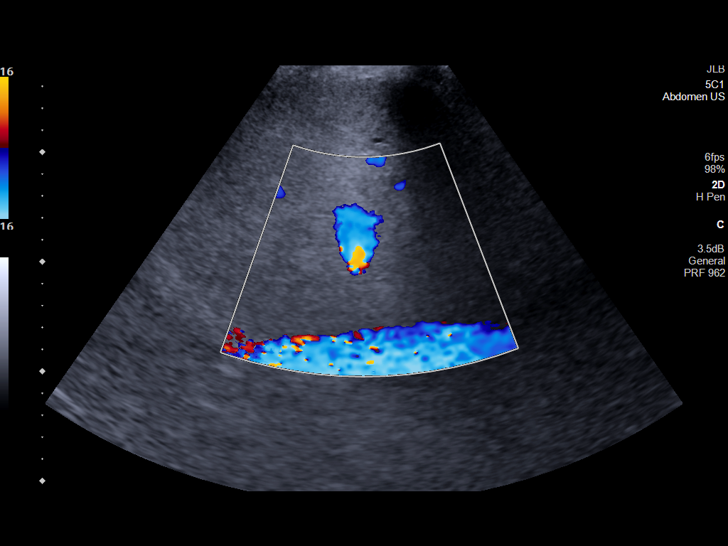
[im 84/84]
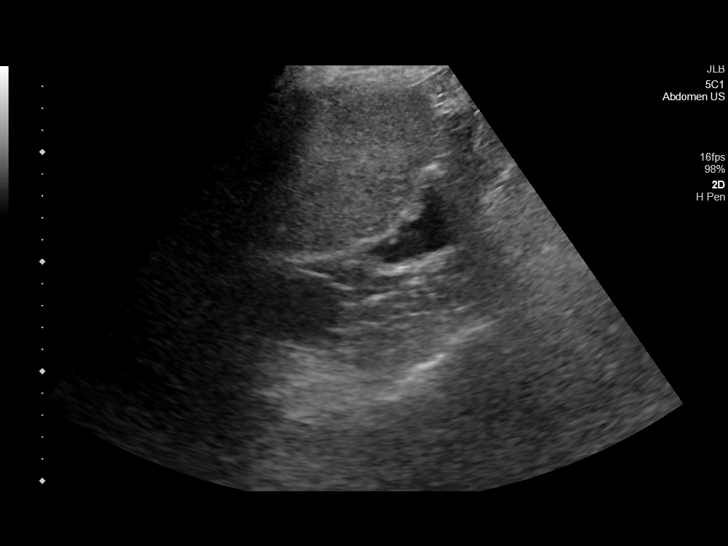

[14 of 25 positions shown; findings below may reference images not displayed]

FINDINGS: Gallbladder:

No gallstones or wall thickening visualized. No sonographic Murphy
sign noted by sonographer.

Common bile duct:

Diameter: 2.9 mm

Liver:

Increased echogenicity. No focal mass. Portal vein is patent on
color Doppler imaging with normal direction of blood flow towards
the liver.

Other: None.
IMPRESSION: 1. The gallbladder and common bile duct are normal in appearance.
2. Hepatic steatosis.

## 2020-06-04 IMAGING — DX DG CHEST 1V PORT
1 series · 1 of 1 positions shown · non-contrast
Comparison: [DATE]

CLINICAL DATA: Chest pain.

EXAM:
PORTABLE CHEST 1 VIEW

[chest ap]
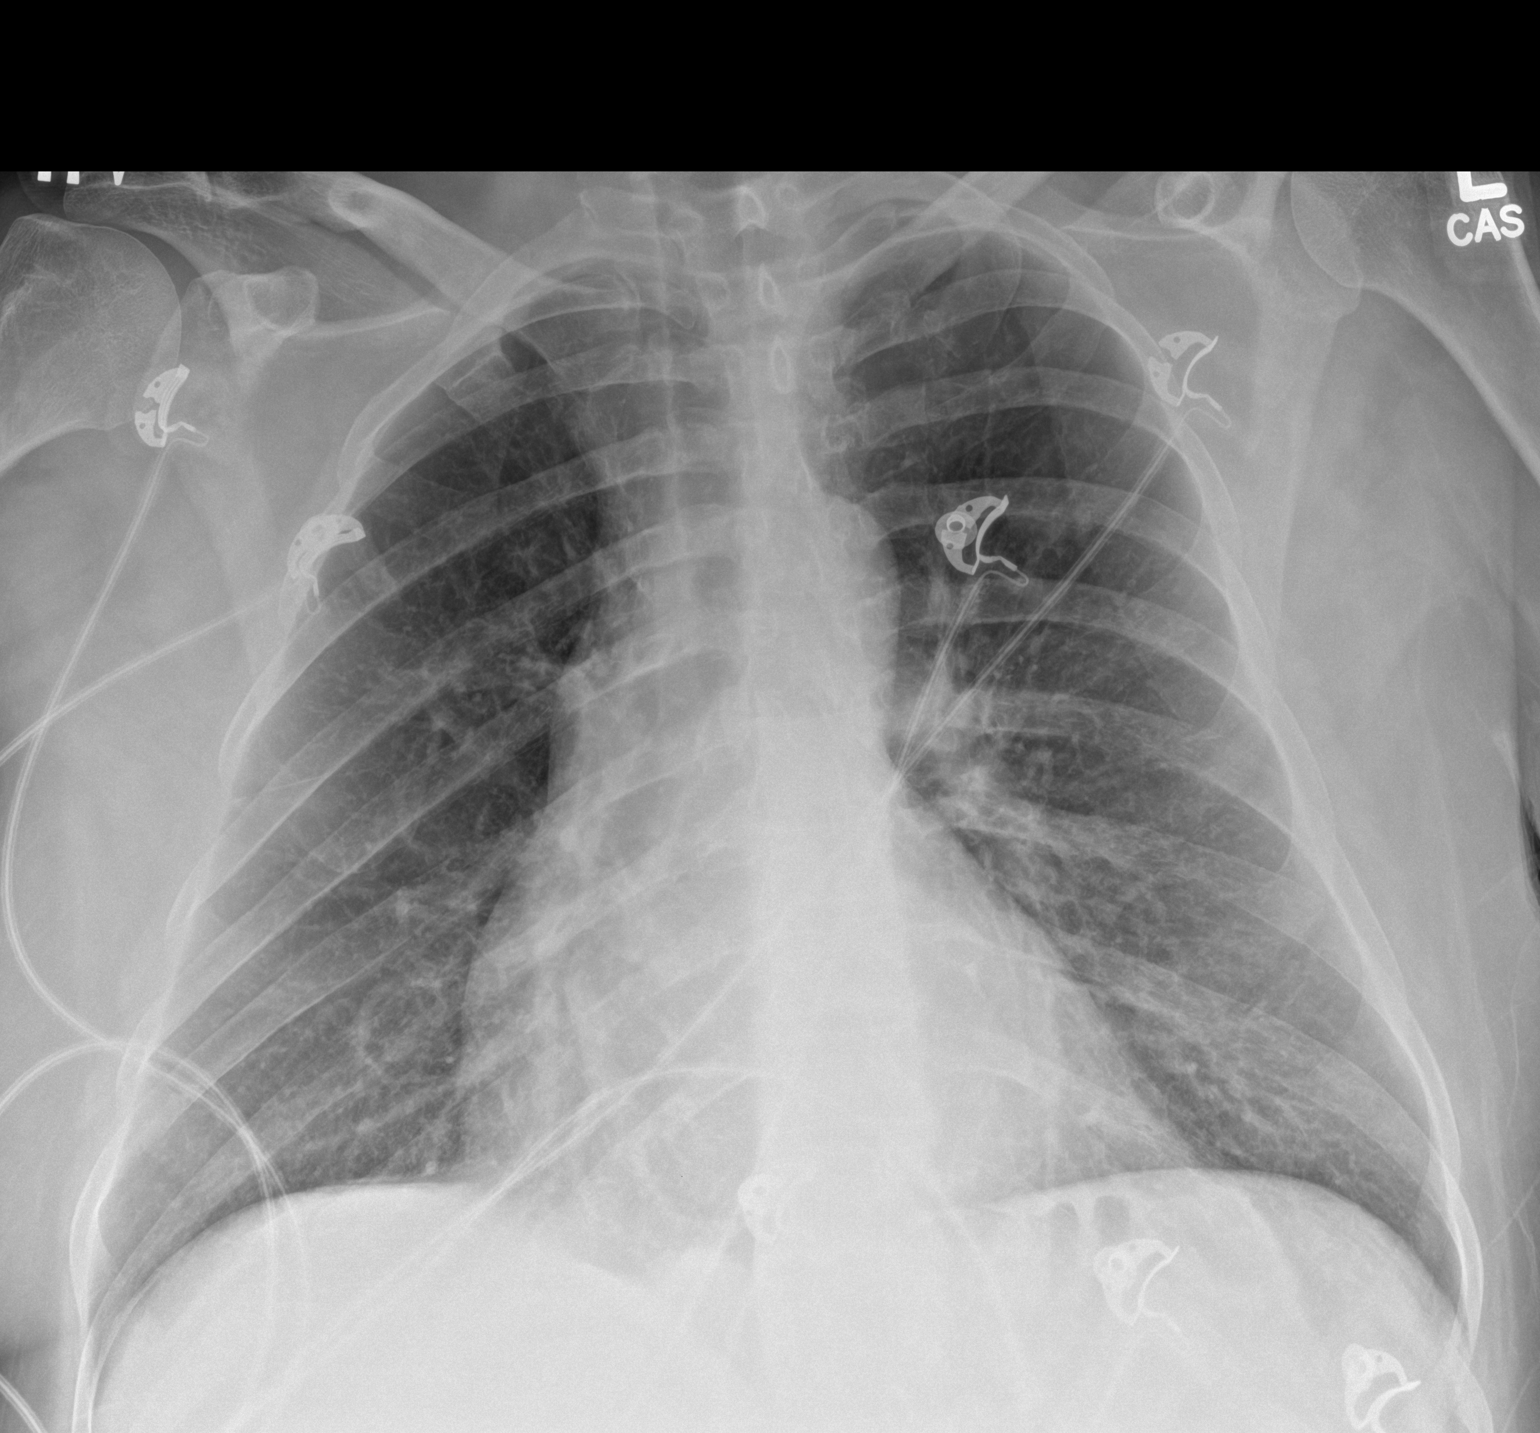

[1 of 1 positions shown; findings below may reference images not displayed]

FINDINGS: Lungs are adequately inflated without focal airspace consolidation
or effusion. Borderline stable cardiomegaly. Remainder of the exam
is unchanged.
IMPRESSION: No active disease.

## 2020-06-04 IMAGING — CT CT ANGIO CHEST-ABD-PELV FOR DISSECTION W/ AND WO/W CM
2 of 7 series · 14 of 46 positions shown, 16 images · IV contrast (APPLIED)
Comparison: CT abdomen pelvis dated [DATE].

CLINICAL DATA: Chest pain.

EXAM:
CT ANGIOGRAPHY CHEST, ABDOMEN AND PELVIS
TECHNIQUE: Non-contrast CT of the chest was initially obtained. Multidetector
CT imaging through the chest, abdomen and pelvis was performed using
the standard protocol during bolus administration of intravenous
contrast. Multiplanar reconstructed images and MIPs were obtained
and reviewed to evaluate the vascular anatomy.
CONTRAST:  100mL OMNIPAQUE IOHEXOL 350 MG/ML SOLN

[Series 6: axial arterial · axial · arterial · 0.70mm/px · z∈[-1156,-610]mm · 11 of 212 slices shown, 13 images]
[im 15/212  soft-tissue]
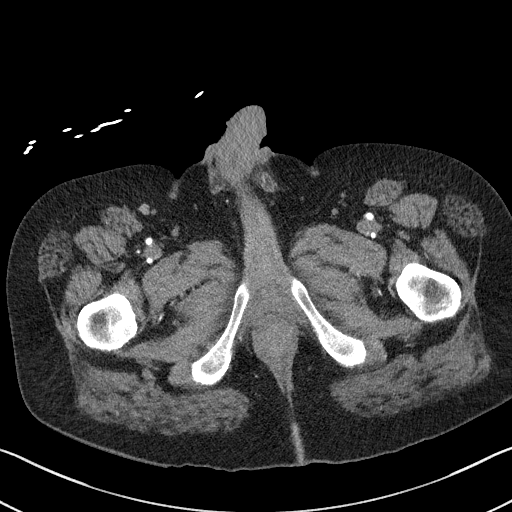
[im 15/212  bone]
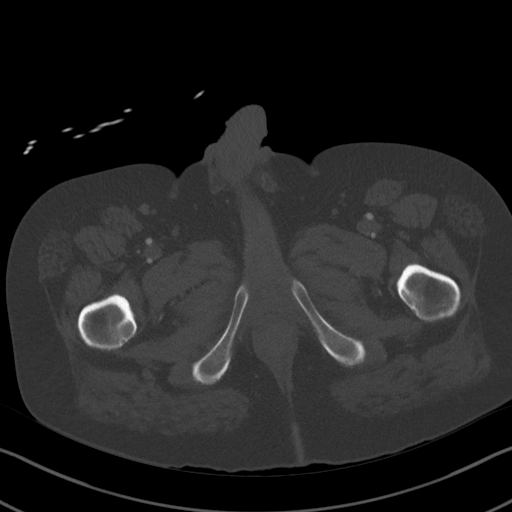
[im 29/212  soft-tissue]
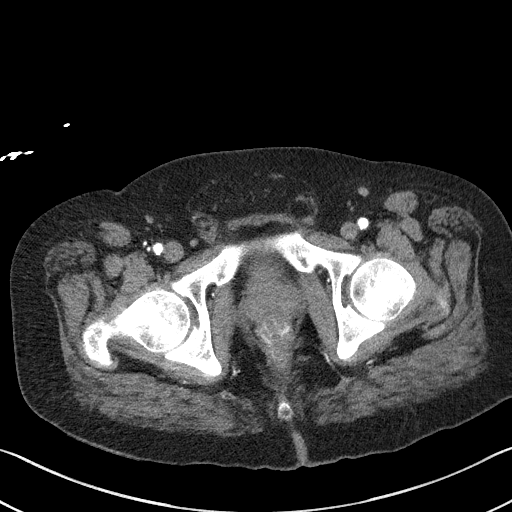
[im 57/212  soft-tissue]
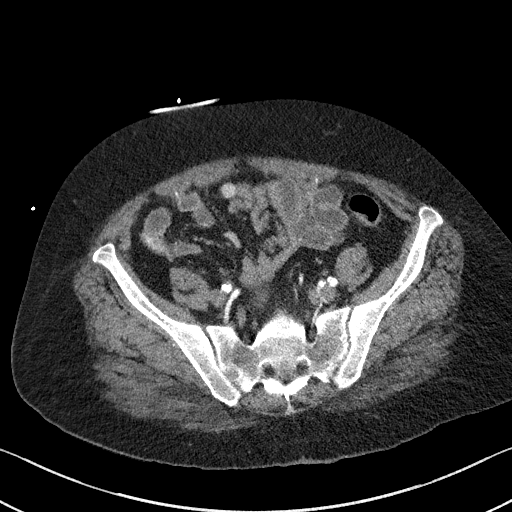
[im 71/212  soft-tissue]
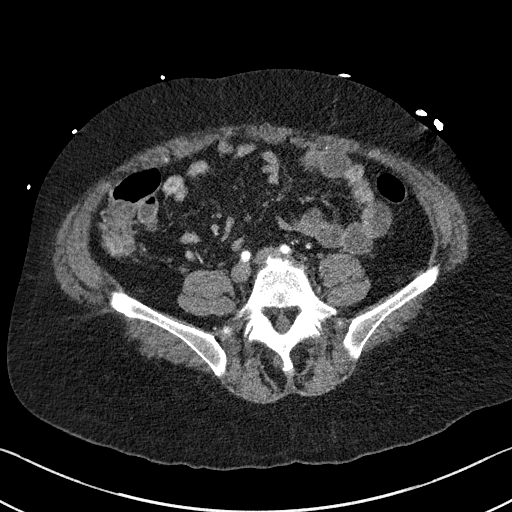
[im 85/212  soft-tissue]
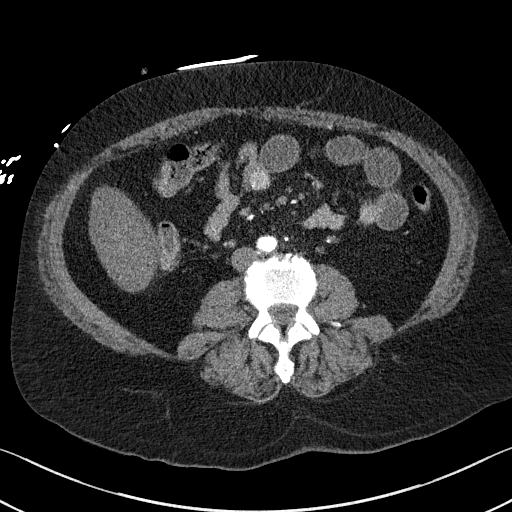
[im 113/212  soft-tissue]
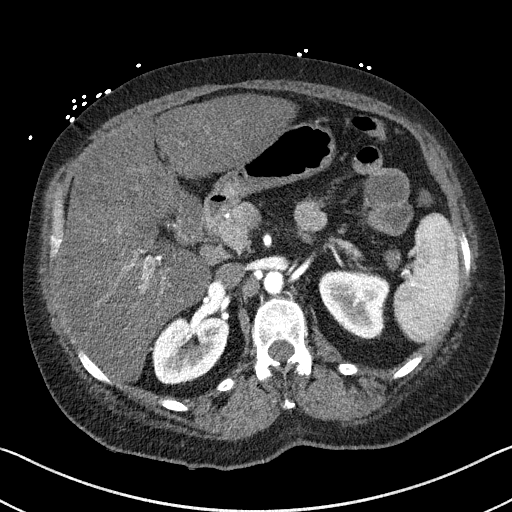
[im 127/212  soft-tissue]
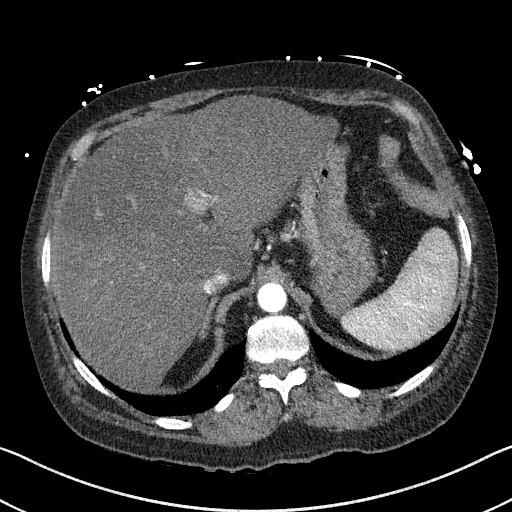
[im 141/212  soft-tissue]
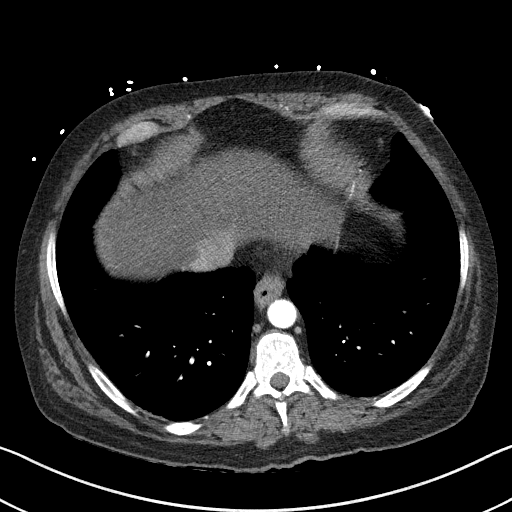
[im 155/212  soft-tissue]
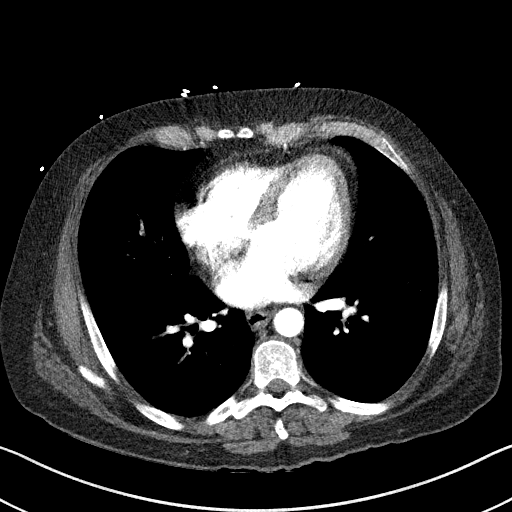
[im 155/212  bone]
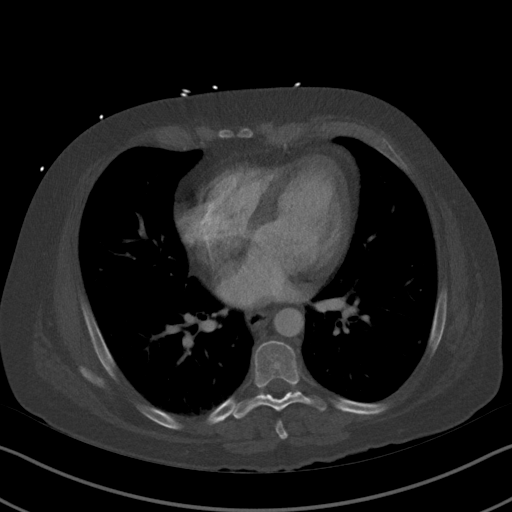
[im 183/212  soft-tissue]
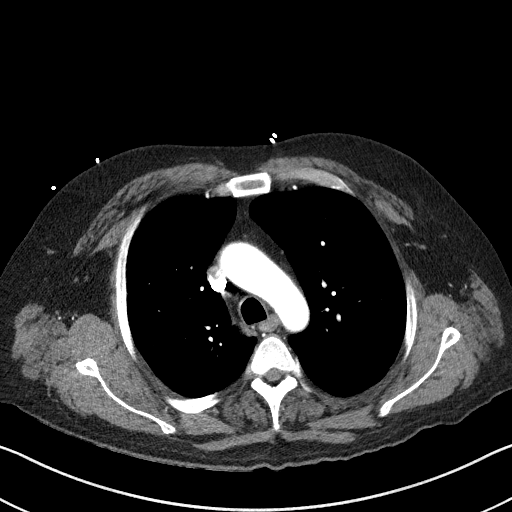
[im 197/212  soft-tissue]
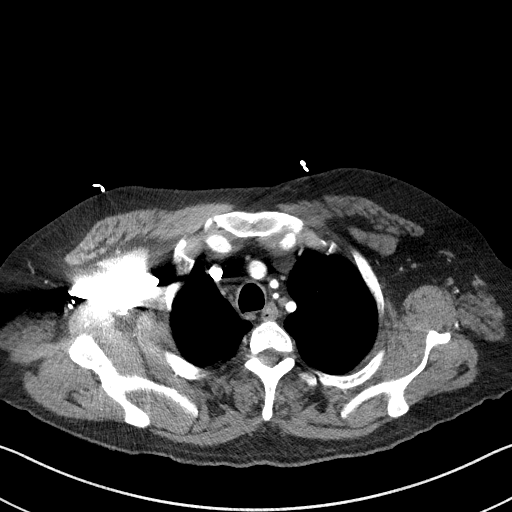

[Series 9: coronals · coronal · 0.76mm/px · 3 of 135 slices shown]
[im 34/135  soft-tissue]
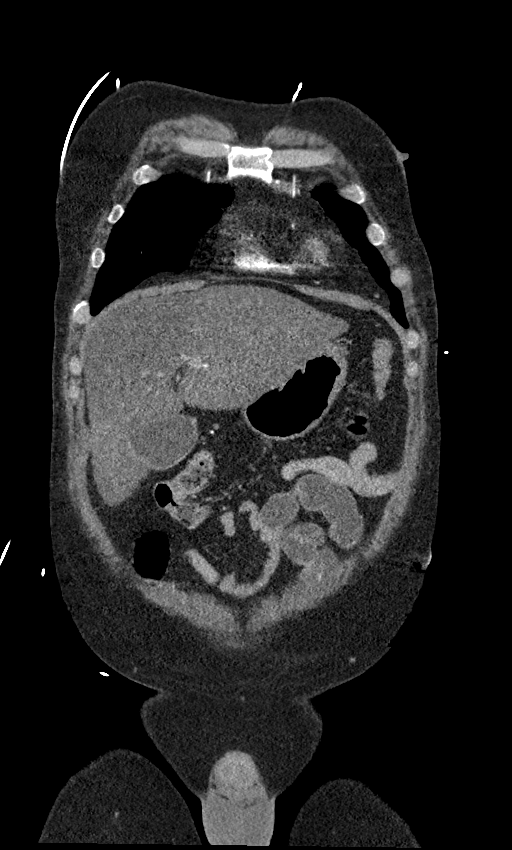
[im 68/135  soft-tissue]
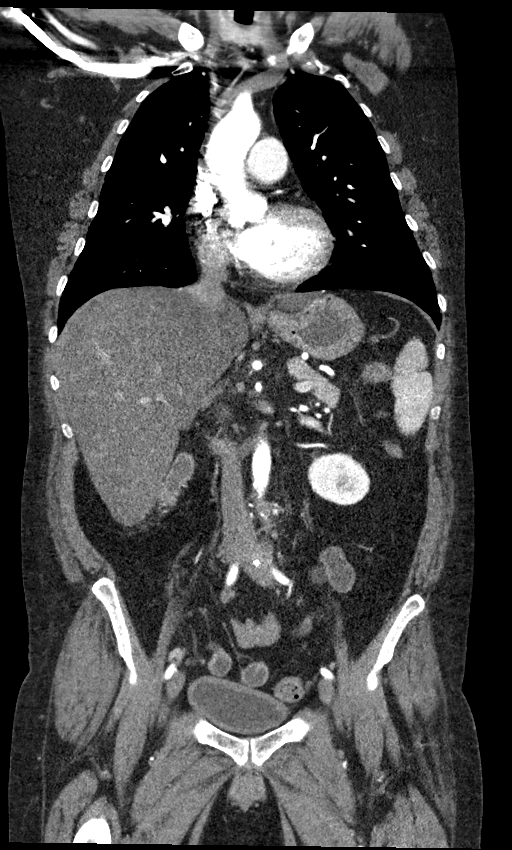
[im 101/135  soft-tissue]
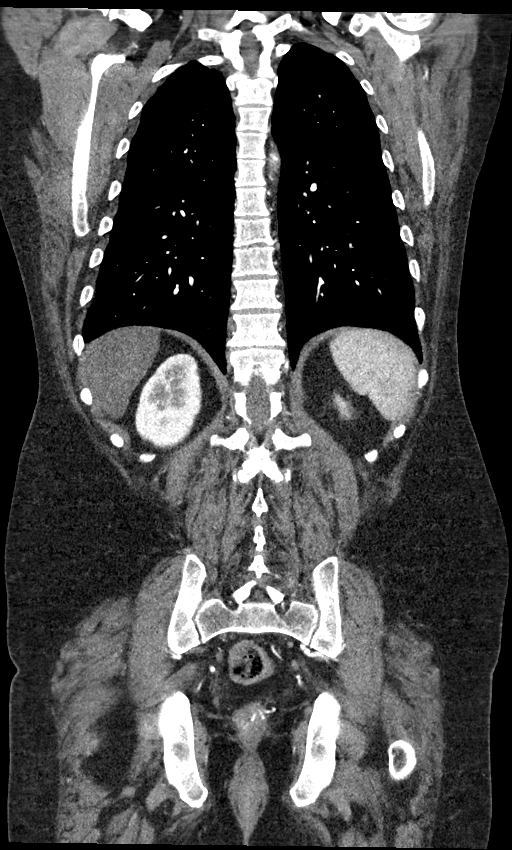

[14 of 46 positions shown; findings below may reference images not displayed]

FINDINGS: CTA CHEST FINDINGS

Cardiovascular: Preferential opacification of the thoracic aorta. No
evidence of thoracic aortic aneurysm or dissection. Vascular
calcifications are seen in the coronary arteries. Normal heart size.
No pericardial effusion.

Mediastinum/Nodes: No enlarged mediastinal, hilar, or axillary lymph
nodes. Thyroid gland, trachea, and esophagus demonstrate no
significant findings.

Lungs/Pleura: Lungs are clear. No pleural effusion or pneumothorax.

Musculoskeletal: No chest wall abnormality. No acute or significant
osseous findings.

Review of the MIP images confirms the above findings.

CTA ABDOMEN AND PELVIS FINDINGS

VASCULAR

Aorta: Mild atherosclerotic disease. Normal caliber aorta without
aneurysm, dissection, vasculitis or significant stenosis.

Celiac: Patent without evidence of aneurysm, dissection, vasculitis
or significant stenosis.

SMA: Patent without evidence of aneurysm, dissection, vasculitis or
significant stenosis.

Renals: Both renal arteries are patent without evidence of aneurysm,
dissection, vasculitis, fibromuscular dysplasia or significant
stenosis.

IMA: Patent without evidence of aneurysm, dissection, vasculitis or
significant stenosis.

Inflow: Mild-to-moderate atherosclerotic disease without evidence of
aneurysm, dissection, vasculitis or significant stenosis.

Veins: No obvious venous abnormality within the limitations of this
arterial phase study.

Review of the MIP images confirms the above findings.

NON-VASCULAR

Hepatobiliary: The liver is hypoattenuating, consistent with hepatic
steatosis. No focal liver abnormality is seen. No gallstones,
gallbladder wall thickening, or biliary dilatation.

Pancreas: Unremarkable. No pancreatic ductal dilatation or
surrounding inflammatory changes.

Spleen: Normal in size without focal abnormality.

Adrenals/Urinary Tract: Adrenal glands are unremarkable. Kidneys are
normal, without renal calculi, focal lesion, or hydronephrosis.
Bladder is unremarkable.

Stomach/Bowel: Stomach is within normal limits. No pericecal
inflammatory changes are noted to suggest acute appendicitis. No
evidence of bowel wall thickening, distention, or inflammatory
changes.

Lymphatic: No abnormally enlarged lymph nodes in the abdomen or
pelvis.

Reproductive: Prostate is unremarkable.

Other: No abdominal wall hernia or abnormality. No abdominopelvic
ascites.

Musculoskeletal: No acute or significant osseous findings.

Review of the MIP images confirms the above findings.
IMPRESSION: 1. No evidence of aortic aneurysm or dissection.
2. No acute process in the chest, abdomen, or pelvis.
3. Hepatic steatosis.

Aortic Atherosclerosis ([KR]-[KR]).

## 2020-06-04 MED ORDER — SODIUM CHLORIDE 0.9 % IV SOLN: INTRAVENOUS | Status: DC

## 2020-06-04 MED ORDER — IOHEXOL 350 MG/ML SOLN
100.0000 mL | Freq: Once | INTRAVENOUS | Status: AC | PRN
  Administered 2020-06-04: 100 mL via INTRAVENOUS

## 2020-06-04 MED ORDER — POTASSIUM CHLORIDE CRYS ER 20 MEQ PO TBCR
40.0000 meq | EXTENDED_RELEASE_TABLET | Freq: Once | ORAL | Status: AC
  Administered 2020-06-04: 40 meq via ORAL
  Filled 2020-06-04: qty 2

## 2020-06-04 MED ORDER — POTASSIUM CHLORIDE 10 MEQ/100ML IV SOLN
10.0000 meq | INTRAVENOUS | Status: AC
  Administered 2020-06-04 (×3): 10 meq via INTRAVENOUS
  Filled 2020-06-04 (×3): qty 100

## 2020-06-04 MED ORDER — MORPHINE SULFATE (PF) 4 MG/ML IV SOLN
4.0000 mg | Freq: Once | INTRAVENOUS | Status: AC
  Administered 2020-06-04: 4 mg via INTRAVENOUS
  Filled 2020-06-04: qty 1

## 2020-06-04 MED ORDER — PANTOPRAZOLE SODIUM 40 MG PO TBEC
40.0000 mg | DELAYED_RELEASE_TABLET | Freq: Every day | ORAL | Status: DC
  Administered 2020-06-05: 40 mg via ORAL
  Filled 2020-06-04: qty 1

## 2020-06-04 MED ORDER — ENOXAPARIN SODIUM 40 MG/0.4ML ~~LOC~~ SOLN
40.0000 mg | SUBCUTANEOUS | Status: DC
  Administered 2020-06-04 – 2020-06-05 (×2): 40 mg via SUBCUTANEOUS
  Filled 2020-06-04 (×2): qty 0.4

## 2020-06-04 MED ORDER — HYDRALAZINE HCL 20 MG/ML IJ SOLN: 5.0000 mg | INTRAMUSCULAR | Status: DC | PRN

## 2020-06-04 MED ORDER — MORPHINE SULFATE (PF) 2 MG/ML IV SOLN
2.0000 mg | INTRAVENOUS | Status: DC | PRN
  Administered 2020-06-04 – 2020-06-05 (×3): 2 mg via INTRAVENOUS
  Filled 2020-06-04 (×4): qty 1

## 2020-06-04 MED ORDER — THIAMINE HCL 100 MG/ML IJ SOLN
100.0000 mg | Freq: Every day | INTRAMUSCULAR | Status: DC
  Administered 2020-06-04: 100 mg via INTRAVENOUS
  Filled 2020-06-04: qty 2

## 2020-06-04 MED ORDER — ASPIRIN 81 MG PO CHEW
81.0000 mg | CHEWABLE_TABLET | Freq: Every day | ORAL | Status: DC
  Administered 2020-06-05 – 2020-06-06 (×2): 81 mg via ORAL
  Filled 2020-06-04 (×2): qty 1

## 2020-06-04 MED ORDER — HYDROMORPHONE HCL 1 MG/ML IJ SOLN: 1.0000 mg | Freq: Once | INTRAMUSCULAR | Status: DC

## 2020-06-04 MED ORDER — THIAMINE HCL 100 MG PO TABS
100.0000 mg | ORAL_TABLET | Freq: Every day | ORAL | Status: DC
  Administered 2020-06-05 – 2020-06-06 (×2): 100 mg via ORAL
  Filled 2020-06-04 (×2): qty 1

## 2020-06-04 MED ORDER — NITROGLYCERIN 0.4 MG SL SUBL
0.4000 mg | SUBLINGUAL_TABLET | SUBLINGUAL | Status: AC | PRN
  Administered 2020-06-04 (×3): 0.4 mg via SUBLINGUAL
  Filled 2020-06-04: qty 1

## 2020-06-04 MED ORDER — MAGNESIUM SULFATE 2 GM/50ML IV SOLN
2.0000 g | Freq: Once | INTRAVENOUS | Status: AC
  Administered 2020-06-04: 2 g via INTRAVENOUS
  Filled 2020-06-04: qty 50

## 2020-06-04 MED ORDER — LORAZEPAM 2 MG/ML IJ SOLN: 0.0000 mg | Freq: Two times a day (BID) | INTRAMUSCULAR | Status: DC

## 2020-06-04 MED ORDER — ADULT MULTIVITAMIN W/MINERALS CH
1.0000 | ORAL_TABLET | Freq: Every day | ORAL | Status: DC
  Administered 2020-06-04 – 2020-06-06 (×3): 1 via ORAL
  Filled 2020-06-04 (×3): qty 1

## 2020-06-04 MED ORDER — LACTATED RINGERS IV BOLUS
1000.0000 mL | Freq: Once | INTRAVENOUS | Status: AC
  Administered 2020-06-04: 1000 mL via INTRAVENOUS

## 2020-06-04 MED ORDER — LORAZEPAM 1 MG PO TABS
1.0000 mg | ORAL_TABLET | ORAL | Status: DC | PRN
  Filled 2020-06-04: qty 3

## 2020-06-04 MED ORDER — LORAZEPAM 2 MG/ML IJ SOLN
1.0000 mg | INTRAMUSCULAR | Status: DC | PRN
  Administered 2020-06-06: 2 mg via INTRAVENOUS
  Administered 2020-06-06: 3 mg via INTRAVENOUS
  Filled 2020-06-04 (×2): qty 2

## 2020-06-04 MED ORDER — FOLIC ACID 1 MG PO TABS
1.0000 mg | ORAL_TABLET | Freq: Every day | ORAL | Status: DC
  Administered 2020-06-04 – 2020-06-06 (×3): 1 mg via ORAL
  Filled 2020-06-04 (×3): qty 1

## 2020-06-04 MED ORDER — LORAZEPAM 2 MG/ML IJ SOLN
1.0000 mg | Freq: Once | INTRAMUSCULAR | Status: AC
  Administered 2020-06-04: 1 mg via INTRAVENOUS
  Filled 2020-06-04: qty 1

## 2020-06-04 MED ORDER — ASPIRIN 81 MG PO CHEW
324.0000 mg | CHEWABLE_TABLET | Freq: Once | ORAL | Status: AC
  Administered 2020-06-04: 324 mg via ORAL
  Filled 2020-06-04: qty 4

## 2020-06-04 MED ORDER — NICOTINE 21 MG/24HR TD PT24
21.0000 mg | MEDICATED_PATCH | Freq: Every day | TRANSDERMAL | Status: DC
  Administered 2020-06-04 – 2020-06-06 (×3): 21 mg via TRANSDERMAL
  Filled 2020-06-04 (×3): qty 1

## 2020-06-04 MED ORDER — LORAZEPAM 2 MG/ML IJ SOLN
0.0000 mg | Freq: Four times a day (QID) | INTRAMUSCULAR | Status: AC
  Administered 2020-06-05: 2 mg via INTRAVENOUS
  Administered 2020-06-05: 1 mg via INTRAVENOUS
  Filled 2020-06-04 (×3): qty 1

## 2020-06-04 NOTE — H&P (Signed)
History and Physical    Ethan Wells BWI:[id number] DOB: 1971/03/09 DOA: 06/04/2020  Referring MD/NP/PA:   PCP: Patient, No Pcp Per   Patient coming from:  The patient is coming from home.  At baseline, pt is independent for most of ADL.        Chief Complaint: chest pain  HPI: Ethan Wells is a 49 y.o. male with medical history significant of hypertension, polysubstance abuse (tobacco, alcohol, amphetamine, cocaine, marijuana), CAD, STEMI, BMS placement, cardiac arrest due to V.  Fib, sCHF with EF of 45-50%, who presents with chest pain.  Patient states that his chest pain started last night, which is located in the substernal area, constant, pressure-like, 7 out of 10 severity, nonradiating.  Associated with mild shortness of breath.  No cough, fever or chills.  Patient states that he has abdominal pain, which is located in the epigastric area, constant, moderate, nonradiating.  Associated with nausea and vomiting.  He vomited once with nonbilious nonbloody vomitus.  No rectal bleeding or dark stool.  Denies symptoms of UTI or unilateral weakness.  ED Course: pt was found to have troponin 7 -->8, positive UDS (for amphetamine, cocaine, marijuana and opiate), negative COVID-19 PCR, alcohol level less than 10, sodium 129, potassium 2.6, renal function okay, temperature normal, tachycardia, oxygen saturation 92% on room air, RR 16, blood pressure 141/98.  CT angiogram is negative for PE. Patient is placed on progressive bed for observation.  Cardiology, Dr. Percival Spanish is consulted.  Review of Systems:   General: no fevers, chills, no body weight gain, has poor appetite, has fatigue HEENT: no blurry vision, hearing changes or sore throat Respiratory: has dyspnea, no coughing, wheezing CV: has chest pain, no palpitations GI: has nausea, vomiting, abdominal pain, no diarrhea, constipation GU: no dysuria, burning on urination, increased urinary frequency, hematuria  Ext: no leg  edema Neuro: no unilateral weakness, numbness, or tingling, no vision change or hearing loss Skin: no rash, no skin tear. MSK: No muscle spasm, no deformity, no limitation of range of movement in spin Heme: No easy bruising.  Travel history: No recent long distant travel.  Allergy:  Allergies  Allergen Reactions  . Penicillins Hives    Past Medical History:  Diagnosis Date  . CAD (coronary artery disease)    a. 01/2011 Anterior STEMI/Cath/PCI: LM nl, LAD 100d (3.5x78mm Vision BMS placed), LCX 84m, RI 50, RCA min irregs, EF 40% w/ apical, inferoapical HK.  . Cardiac arrest - ventricular fibrillation    a. In setting of STEMI 01/2011  . Cocaine abuse ([place])   . ETOH abuse    a. 6-12 beers/day  . Hemorrhoids   . Hypertension   . Ischemic cardiomyopathy    a. 06/2011 Echo: EF 45-50%, No rwma  . Marijuana abuse   . Tobacco abuse    a. 1/2 ppd x 26 yrs    Past Surgical History:  Procedure Laterality Date  . CORONARY ANGIOPLASTY WITH STENT PLACEMENT      Social History:  reports that he has been smoking cigarettes. He has a 13.00 pack-year smoking history. He has never used smokeless tobacco. He reports current alcohol use. He reports current drug use. Drugs: Cocaine and Marijuana.  Family History:  Family History  Problem Relation Age of Onset  . Coronary artery disease Mother        alive  . Other Other        2 sisters alive and well  . Alcohol abuse Father  died @ 40     Prior to Admission medications   Medication Sig Start Date End Date Taking? Authorizing Provider  carvedilol (COREG) 6.25 MG tablet Take 1 tablet (6.25 mg total) by mouth 2 (two) times daily with a meal. 07/15/19 08/14/19  Pyreddy, Reatha Harps, MD  dicyclomine (BENTYL) 20 MG tablet Take 1 tablet (20 mg total) by mouth every 6 (six) hours. 04/25/20   Paulette Blanch, MD  feeding supplement, ENSURE ENLIVE, (ENSURE ENLIVE) LIQD Take 237 mLs by mouth 2 (two) times daily between meals. 05/27/18   Salary, Avel Peace,  MD  gabapentin (NEURONTIN) 100 MG capsule Take 1 capsule (100 mg total) by mouth 3 (three) times daily. 06/23/19   Domenic Moras, PA-C  isosorbide mononitrate (IMDUR) 30 MG 24 hr tablet Take 1 tablet (30 mg total) by mouth daily. 07/16/19 08/15/19  Saundra Shelling, MD  lactulose (CHRONULAC) 10 GM/15ML solution Take 30 mLs (20 g total) by mouth daily as needed for mild constipation. 04/25/20   Paulette Blanch, MD  lisinopril (ZESTRIL) 2.5 MG tablet Take 1 tablet (2.5 mg total) by mouth daily. 07/16/19 08/15/19  Saundra Shelling, MD  nicotine (NICODERM CQ - DOSED IN MG/24 HOURS) 21 mg/24hr patch Place 1 patch (21 mg total) onto the skin daily. 07/16/19   Saundra Shelling, MD  nitroGLYCERIN (NITROSTAT) 0.4 MG SL tablet Place 1 tablet (0.4 mg total) under the tongue every 5 (five) minutes as needed for chest pain. 07/15/19   Saundra Shelling, MD  omeprazole (PRILOSEC OTC) 20 MG tablet Take 1 tablet (20 mg total) by mouth daily. 06/17/19 06/16/20  Hinda Kehr, MD    Physical Exam: Vitals:   06/04/20 1500 06/04/20 1530 06/04/20 1600 06/04/20 1630  BP: (!) 146/99 (!) 144/105 (!) 141/98 (!) 148/104  Pulse: (!) 101 (!) 104 100 (!) 101  Resp: 20 15 19 13   Temp:      TempSrc:      SpO2: 100% 99% 99% 100%  Weight:      Height:       General: Not in acute distress HEENT:       Eyes: PERRL, EOMI, no scleral icterus.       ENT: No discharge from the ears and nose, no pharynx injection, no tonsillar enlargement.        Neck: No JVD, no bruit, no mass felt. Heme: No neck lymph node enlargement. Cardiac: S1/S2, RRR, No murmurs, No gallops or rubs. Respiratory:  No rales, wheezing, rhonchi or rubs. GI: Soft, nondistended, has tenderness in epigastric area, no rebound pain, no organomegaly, BS present. GU: No hematuria Ext: No pitting leg edema bilaterally. 2+DP/PT pulse bilaterally. Musculoskeletal: No joint deformities, No joint redness or warmth, no limitation of ROM in spin. Skin: No rashes.  Neuro: Alert, oriented X3,  cranial nerves II-XII grossly intact, moves all extremities normally.  Psych: Patient is not psychotic, no suicidal or hemocidal ideation.  Labs on Admission: I have personally reviewed following labs and imaging studies  CBC: Recent Labs  Lab 06/04/20 0850  WBC 12.2*  HGB 16.2  HCT RESULTS UNAVAILABLE DUE TO INTERFERING SUBSTANCE  MCV RESULTS UNAVAILABLE DUE TO INTERFERING SUBSTANCE  PLT 353   Basic Metabolic Panel: Recent Labs  Lab 06/04/20 0850  NA 129*  K 2.6*  CL 95*  CO2 17*  GLUCOSE 122*  BUN <5*  CREATININE 0.77  CALCIUM 8.1*  MG 1.3*   GFR: Estimated Creatinine Clearance: 112.2 mL/min (by C-G formula based on SCr of 0.77 mg/dL). Liver  Function Tests: Recent Labs  Lab 06/04/20 0850  AST 47*  ALT 23  ALKPHOS 163*  BILITOT 1.8*  PROT 7.0  ALBUMIN 2.8*   Recent Labs  Lab 06/04/20 0850  LIPASE 26   No results for input(s): AMMONIA in the last 168 hours. Coagulation Profile: No results for input(s): INR, PROTIME in the last 168 hours. Cardiac Enzymes: No results for input(s): CKTOTAL, CKMB, CKMBINDEX, TROPONINI in the last 168 hours. BNP (last 3 results) No results for input(s): PROBNP in the last 8760 hours. HbA1C: No results for input(s): HGBA1C in the last 72 hours. CBG: No results for input(s): GLUCAP in the last 168 hours. Lipid Profile: No results for input(s): CHOL, HDL, LDLCALC, TRIG, CHOLHDL, LDLDIRECT in the last 72 hours. Thyroid Function Tests: No results for input(s): TSH, T4TOTAL, FREET4, T3FREE, THYROIDAB in the last 72 hours. Anemia Panel: No results for input(s): VITAMINB12, FOLATE, FERRITIN, TIBC, IRON, RETICCTPCT in the last 72 hours. Urine analysis:    Component Value Date/Time   COLORURINE YELLOW (A) 06/04/2020 1149   APPEARANCEUR CLEAR (A) 06/04/2020 1149   APPEARANCEUR Clear 03/16/2012 1315   LABSPEC 1.002 (L) 06/04/2020 1149   LABSPEC 1.013 03/16/2012 1315   PHURINE 7.0 06/04/2020 1149   GLUCOSEU NEGATIVE 06/04/2020  1149   GLUCOSEU 50 mg/dL 03/16/2012 1315   HGBUR NEGATIVE 06/04/2020 1149   BILIRUBINUR NEGATIVE 06/04/2020 1149   BILIRUBINUR Negative 03/16/2012 1315   KETONESUR NEGATIVE 06/04/2020 1149   PROTEINUR NEGATIVE 06/04/2020 1149   NITRITE NEGATIVE 06/04/2020 1149   LEUKOCYTESUR NEGATIVE 06/04/2020 1149   LEUKOCYTESUR Negative 03/16/2012 1315   Sepsis Labs: @LABRCNTIP (procalcitonin:4,lacticidven:4) ) Recent Results (from the past 240 hour(s))  SARS Coronavirus 2 by RT PCR (hospital order, performed in [facility] hospital lab) Nasopharyngeal Nasopharyngeal Swab     Status: None   Collection Time: 06/04/20  1:27 PM   Specimen: Nasopharyngeal Swab  Result Value Ref Range Status   SARS Coronavirus 2 NEGATIVE NEGATIVE Final    Comment: (NOTE) SARS-CoV-2 target nucleic acids are NOT DETECTED.  The SARS-CoV-2 RNA is generally detectable in upper and lower respiratory specimens during the acute phase of infection. The lowest concentration of SARS-CoV-2 viral copies this assay can detect is 250 copies / mL. A negative result does not preclude SARS-CoV-2 infection and should not be used as the sole basis for treatment or other patient management decisions.  A negative result may occur with improper specimen collection / handling, submission of specimen other than nasopharyngeal swab, presence of viral mutation(s) within the areas targeted by this assay, and inadequate number of viral copies (<250 copies / mL). A negative result must be combined with clinical observations, patient history, and epidemiological information.  Fact Sheet for Patients:   StrictlyIdeas.no  Fact Sheet for Healthcare Providers: BankingDealers.co.za  This test is not yet approved or  cleared by the [place] FDA and has been authorized for detection and/or diagnosis of SARS-CoV-2 by FDA under an Emergency Use Authorization (EUA).  This EUA will remain in effect  (meaning this test can be used) for the duration of the COVID-19 declaration under Section 564(b)(1) of the Act, 21 U.S.C. section 360bbb-3(b)(1), unless the authorization is terminated or revoked sooner.  Performed at [facility], [address] [place], [place] [address]      Radiological Exams on Admission: DG Chest Portable 1 View  Result Date: 06/04/2020 CLINICAL DATA:  Chest pain. EXAM: PORTABLE CHEST 1 VIEW COMPARISON:  04/25/2020 FINDINGS: Lungs are adequately inflated without focal  airspace consolidation or effusion. Borderline stable cardiomegaly. Remainder of the exam is unchanged. IMPRESSION: No active disease. Electronically Signed   By: Marin Olp M.D.   On: 06/04/2020 10:58   CT Angio Chest/Abd/Pel for Dissection W and/or W/WO  Result Date: 06/04/2020 CLINICAL DATA:  Chest pain. EXAM: CT ANGIOGRAPHY CHEST, ABDOMEN AND PELVIS TECHNIQUE: Non-contrast CT of the chest was initially obtained. Multidetector CT imaging through the chest, abdomen and pelvis was performed using the standard protocol during bolus administration of intravenous contrast. Multiplanar reconstructed images and MIPs were obtained and reviewed to evaluate the vascular anatomy. CONTRAST:  178mL OMNIPAQUE IOHEXOL 350 MG/ML SOLN COMPARISON:  CT abdomen pelvis dated 04/19/2018. FINDINGS: CTA CHEST FINDINGS Cardiovascular: Preferential opacification of the thoracic aorta. No evidence of thoracic aortic aneurysm or dissection. Vascular calcifications are seen in the coronary arteries. Normal heart size. No pericardial effusion. Mediastinum/Nodes: No enlarged mediastinal, hilar, or axillary lymph nodes. Thyroid gland, trachea, and esophagus demonstrate no significant findings. Lungs/Pleura: Lungs are clear. No pleural effusion or pneumothorax. Musculoskeletal: No chest wall abnormality. No acute or significant osseous findings. Review of the MIP images confirms the above findings. CTA ABDOMEN AND PELVIS  FINDINGS VASCULAR Aorta: Mild atherosclerotic disease. Normal caliber aorta without aneurysm, dissection, vasculitis or significant stenosis. Celiac: Patent without evidence of aneurysm, dissection, vasculitis or significant stenosis. SMA: Patent without evidence of aneurysm, dissection, vasculitis or significant stenosis. Renals: Both renal arteries are patent without evidence of aneurysm, dissection, vasculitis, fibromuscular dysplasia or significant stenosis. IMA: Patent without evidence of aneurysm, dissection, vasculitis or significant stenosis. Inflow: Mild-to-moderate atherosclerotic disease without evidence of aneurysm, dissection, vasculitis or significant stenosis. Veins: No obvious venous abnormality within the limitations of this arterial phase study. Review of the MIP images confirms the above findings. NON-VASCULAR Hepatobiliary: The liver is hypoattenuating, consistent with hepatic steatosis. No focal liver abnormality is seen. No gallstones, gallbladder wall thickening, or biliary dilatation. Pancreas: Unremarkable. No pancreatic ductal dilatation or surrounding inflammatory changes. Spleen: Normal in size without focal abnormality. Adrenals/Urinary Tract: Adrenal glands are unremarkable. Kidneys are normal, without renal calculi, focal lesion, or hydronephrosis. Bladder is unremarkable. Stomach/Bowel: Stomach is within normal limits. No pericecal inflammatory changes are noted to suggest acute appendicitis. No evidence of bowel wall thickening, distention, or inflammatory changes. Lymphatic: No abnormally enlarged lymph nodes in the abdomen or pelvis. Reproductive: Prostate is unremarkable. Other: No abdominal wall hernia or abnormality. No abdominopelvic ascites. Musculoskeletal: No acute or significant osseous findings. Review of the MIP images confirms the above findings. IMPRESSION: 1. No evidence of aortic aneurysm or dissection. 2. No acute process in the chest, abdomen, or pelvis. 3. Hepatic  steatosis. Aortic Atherosclerosis (ICD10-I70.0). Electronically Signed   By: Zerita Boers M.D.   On: 06/04/2020 12:43   US Abdomen Limited RUQ  Result Date: 06/04/2020 CLINICAL DATA:  Epigastric pain. EXAM: ULTRASOUND ABDOMEN LIMITED RIGHT UPPER QUADRANT COMPARISON:  None. FINDINGS: Gallbladder: No gallstones or wall thickening visualized. No sonographic Murphy sign noted by sonographer. Common bile duct: Diameter: 2.9 mm Liver: Increased echogenicity. No focal mass. Portal vein is patent on color Doppler imaging with normal direction of blood flow towards the liver. Other: None. IMPRESSION: 1. The gallbladder and common bile duct are normal in appearance. 2. Hepatic steatosis. Electronically Signed   By: Dorise Bullion III M.D   On: 06/04/2020 15:47     EKG: Independently reviewed.  Sinus rhythm, tachycardia, QTC 598, low voltage, poor R wave progression, ST depression in inferior leads and V3-V6  Assessment/Plan Principal Problem:  Chest pain Active Problems:   TOBACCO ABUSE   HYPERTENSION, BENIGN   CAD, NATIVE VESSEL   Cocaine abuse (HCC)   Marijuana abuse   Polysubstance abuse (HCC)   Hyponatremia   Hypomagnesemia   Chronic systolic CHF (congestive heart failure) (HCC)   Leukocytosis   Abdominal pain   Chest pain and hx of CAD: s/p of BMS placement. Troponin negative, 7 -->8.  UDS positive for amphetamine, cocaine, marijuana, which is likely partially contributed.  Cardiology, Dr. Percival Spanish is consulted.  - place to progressive unit for observation - Trend Trop - Repeat EKG in the am  - prn Nitroglycerin, Morphine - ASA - Risk factor stratification: will check FLP and A1C   Polysubstance abuse: tobacco, alcohol, amphetamine, cocaine, marijuana abuse. -Did counseling about importance of quitting substance -CIWA protocol -Nicotine patch  HYPERTENSION, BENIGN: not taking meds -IV hydralazine as needed  Hypomagnesemia and hypokalemia: -Replaced both  Hyponatremia:  Sodium 129, mental status normal, likely due to potomania 2/2 alcohol abuse - Will check urine sodium, urine osmolality, serum osmolality. - check TSH - Fluid restriction - IVF: 1L LR  in ED, will continue with IV normal saline at 75 mL/h - f/u by BMP q8h - avoid over correction too fast due to risk of central pontine myelinolysis  Chronic systolic CHF (congestive heart failure) ([place]): 2D echo on 07/05/2019 showed EF of 45-50%.  Patient does not have leg edema or JVD.  CHF symptoms compensated. -Check BNP  Leukocytosis: -f/u by CBC  Abdominal pain: Patient has mild abnormal liver function with AST 47, ALT 23, total bilirubin 1.3 which is likely due to alcohol abuse .His abdominal pain is in the epigastric area, likely due to alcoholic gastritis.  Lipase normal -Start Protonix     DVT ppx: SQ Lovenox Code Status: Full code Family Communication: not done, no family member is at bed side.    Disposition Plan:  Anticipate discharge back to previous environment Consults called: Dr. Percival Spanish of cardiology Admission status: progressive unit for obs      Status is: Observation  The patient remains OBS appropriate and will d/c before 2 midnights.  Dispo: The patient is from: Home              Anticipated d/c is to: Home              Anticipated d/c date is: 1 day              Patient currently is not medically stable to d/c.           Date of Service 06/04/2020    Ivor [facility]   If 7PM-7AM, please contact night-coverage [email or web address] 06/04/2020, 5:10 PM

## 2020-06-04 NOTE — ED Triage Notes (Signed)
Pt arrives to ER via [facility] for L sided CP, back pain, abd pain that began this morning per pt. States vomiting and cough. Arrives with IV to R hand. Admits to smoking weed and trying crystal meth on Friday. States first time. A&O. In wheelchair.

## 2020-06-04 NOTE — ED Provider Notes (Signed)
[facility] Emergency Department Provider Note   ____________________________________________   First MD Initiated Contact with Patient 06/04/20 [phone]     (approximate)  I have reviewed the triage vital signs and the nursing notes.   HISTORY  Chief Complaint Chest Pain   HPI Ethan Wells is a 49 y.o. male with past medical history of CAD, hypertension, CHF, cocaine abuse, and alcohol abuse who presents to the ED complaining of chest pain.  Patient reports that he developed sharp stabbing pain in the center of his chest around 6:00 this morning that seems to radiate to his back as well as to his upper abdomen.  Pain gradually worsened and he eventually called EMS, was given nitroglycerin and aspirin during transport but became nauseous and vomited up the aspirin.  He reports feeling short of breath due to the severity of pain, but denies any recent fevers or cough.  He states he has not used cocaine in the past couple of months, but does admit to using methamphetamine 2 days ago as well as daily alcohol consumption.  His last drink was last night and he denies any withdrawal symptoms at this time.        Past Medical History:  Diagnosis Date  . CAD (coronary artery disease)    a. 01/2011 Anterior STEMI/Cath/PCI: LM nl, LAD 100d (3.5x84mm Vision BMS placed), LCX 13m, RI 50, RCA min irregs, EF 40% w/ apical, inferoapical HK.  . Cardiac arrest - ventricular fibrillation    a. In setting of STEMI 01/2011  . Cocaine abuse ([place])   . ETOH abuse    a. 6-12 beers/day  . Hemorrhoids   . Hypertension   . Ischemic cardiomyopathy    a. 06/2011 Echo: EF 45-50%, No rwma  . Marijuana abuse   . Tobacco abuse    a. 1/2 ppd x 26 yrs    Patient Active Problem List   Diagnosis Date Noted  . Hyponatremia 06/04/2020  . Hypomagnesemia 06/04/2020  . Chronic systolic CHF (congestive heart failure) ([place]) 06/04/2020  . Leukocytosis 06/04/2020  . Ischemic cardiomyopathy    . Polysubstance abuse ([place])   . Chest pain 07/12/2019  . Abdominal pain, RUQ 05/25/2018  . Chest pain, mid sternal 11/17/2012  . Cocaine abuse ([place])   . Marijuana abuse   . TOBACCO ABUSE 02/26/2011  . HYPERTENSION, BENIGN 02/26/2011  . CAD, NATIVE VESSEL 02/26/2011  . Other specified forms of chronic ischemic heart disease 02/26/2011    Past Surgical History:  Procedure Laterality Date  . CORONARY ANGIOPLASTY WITH STENT PLACEMENT      Prior to Admission medications   Medication Sig Start Date End Date Taking? Authorizing Provider  carvedilol (COREG) 6.25 MG tablet Take 1 tablet (6.25 mg total) by mouth 2 (two) times daily with a meal. 07/15/19 08/14/19  Pyreddy, Reatha Harps, MD  dicyclomine (BENTYL) 20 MG tablet Take 1 tablet (20 mg total) by mouth every 6 (six) hours. 04/25/20   Paulette Blanch, MD  feeding supplement, ENSURE ENLIVE, (ENSURE ENLIVE) LIQD Take 237 mLs by mouth 2 (two) times daily between meals. 05/27/18   Salary, Avel Peace, MD  gabapentin (NEURONTIN) 100 MG capsule Take 1 capsule (100 mg total) by mouth 3 (three) times daily. 06/23/19   Domenic Moras, PA-C  isosorbide mononitrate (IMDUR) 30 MG 24 hr tablet Take 1 tablet (30 mg total) by mouth daily. 07/16/19 08/15/19  Saundra Shelling, MD  lactulose (CHRONULAC) 10 GM/15ML solution Take 30 mLs (20 g total) by mouth  daily as needed for mild constipation. 04/25/20   Paulette Blanch, MD  lisinopril (ZESTRIL) 2.5 MG tablet Take 1 tablet (2.5 mg total) by mouth daily. 07/16/19 08/15/19  Saundra Shelling, MD  nicotine (NICODERM CQ - DOSED IN MG/24 HOURS) 21 mg/24hr patch Place 1 patch (21 mg total) onto the skin daily. 07/16/19   Saundra Shelling, MD  nitroGLYCERIN (NITROSTAT) 0.4 MG SL tablet Place 1 tablet (0.4 mg total) under the tongue every 5 (five) minutes as needed for chest pain. 07/15/19   Saundra Shelling, MD  omeprazole (PRILOSEC OTC) 20 MG tablet Take 1 tablet (20 mg total) by mouth daily. 06/17/19 06/16/20  Hinda Kehr, MD     Allergies Penicillins  Family History  Problem Relation Age of Onset  . Coronary artery disease Mother        alive  . Other Other        2 sisters alive and well  . Alcohol abuse Father        died @ 6    Social History Social History   Tobacco Use  . Smoking status: Current Every Day Smoker    Packs/day: 0.50    Years: 26.00    Pack years: 13.00    Types: Cigarettes  . Smokeless tobacco: Never Used  Substance Use Topics  . Alcohol use: Yes    Comment: at least a 6 pack of beer daily and often a 12 pack  . Drug use: Yes    Types: Cocaine, Marijuana    Comment: denies cocaine; marijuana 2 days ago    Review of Systems  Constitutional: No fever/chills Eyes: No visual changes. ENT: No sore throat. Cardiovascular: Positive for chest pain. Respiratory: Denies shortness of breath. Gastrointestinal: Positive for abdominal pain, nausea, and vomiting.  No diarrhea.  No constipation. Genitourinary: Negative for dysuria. Musculoskeletal: Positive for back pain. Skin: Negative for rash. Neurological: Negative for headaches, focal weakness or numbness.  ____________________________________________   PHYSICAL EXAM:  VITAL SIGNS: ED Triage Vitals  Enc Vitals Group     BP 06/04/20 0848 (!) 141/104     Pulse Rate 06/04/20 0848 (!) 130     Resp 06/04/20 0848 (!) 26     Temp 06/04/20 0848 97.9 F (36.6 C)     Temp Source 06/04/20 0848 Oral     SpO2 06/04/20 0848 100 %     Weight 06/04/20 0846 180 lb (81.6 kg)     Height 06/04/20 0846 5\' 5"  (1.651 m)     Head Circumference --      Peak Flow --      Pain Score 06/04/20 0846 10     Pain Loc --      Pain Edu? --      Excl. in [place]? --     Constitutional: Alert and oriented, uncomfortable appearing. Eyes: Conjunctivae are normal. Head: Atraumatic. Nose: No congestion/rhinnorhea. Mouth/Throat: Mucous membranes are dry. Neck: Normal ROM Cardiovascular: Tachycardic, regular rhythm. Grossly normal heart  sounds. Respiratory: Normal respiratory effort.  No retractions. Lungs CTAB. Gastrointestinal: Soft and tender to palpation in the epigastrium. No distention. Genitourinary: deferred Musculoskeletal: No lower extremity tenderness nor edema. Neurologic:  Normal speech and language. No gross focal neurologic deficits are appreciated. Skin:  Skin is warm, dry and intact. No rash noted. Psychiatric: Mood and affect are normal. Speech and behavior are normal.  ____________________________________________   LABS (all labs ordered are listed, but only abnormal results are displayed)  Labs Reviewed  BASIC METABOLIC PANEL - Abnormal;  Notable for the following components:      Result Value   Sodium 129 (*)    Potassium 2.6 (*)    Chloride 95 (*)    CO2 17 (*)    Glucose, Bld 122 (*)    BUN <5 (*)    Calcium 8.1 (*)    Anion gap 17 (*)    All other components within normal limits  CBC - Abnormal; Notable for the following components:   WBC 12.2 (*)    All other components within normal limits  URINE DRUG SCREEN, QUALITATIVE ([facility] ONLY) - Abnormal; Notable for the following components:   Amphetamines, Ur Screen POSITIVE (*)    Cocaine Metabolite,Ur [place] POSITIVE (*)    Opiate, Ur Screen POSITIVE (*)    Cannabinoid 50 Ng, Ur [place] POSITIVE (*)    All other components within normal limits  URINALYSIS, COMPLETE (UACMP) WITH MICROSCOPIC - Abnormal; Notable for the following components:   Color, Urine YELLOW (*)    APPearance CLEAR (*)    Specific Gravity, Urine 1.002 (*)    All other components within normal limits  HEPATIC FUNCTION PANEL - Abnormal; Notable for the following components:   Albumin 2.8 (*)    AST 47 (*)    Alkaline Phosphatase 163 (*)    Total Bilirubin 1.8 (*)    Bilirubin, Direct 0.5 (*)    Indirect Bilirubin 1.3 (*)    All other components within normal limits  MAGNESIUM - Abnormal; Notable for the following components:   Magnesium 1.3 (*)    All other components  within normal limits  SARS CORONAVIRUS 2 BY RT PCR (HOSPITAL ORDER, [facility])  ETHANOL  LIPASE, BLOOD  BRAIN NATRIURETIC PEPTIDE  TROPONIN I (HIGH SENSITIVITY)  TROPONIN I (HIGH SENSITIVITY)   ____________________________________________  EKG  ED ECG REPORT I, Blake Divine, the attending physician, personally viewed and interpreted this ECG.   Date: 06/04/2020  EKG Time: 8:45  Rate: 127  Rhythm: sinus tachycardia  Axis: Normal  Intervals:Prolonged QT  ST&T Change: Nonspecific T wave changes   PROCEDURES  Procedure(s) performed (including Critical Care):  Procedures   ____________________________________________   INITIAL IMPRESSION / ASSESSMENT AND PLAN / ED COURSE       49 year old male with history of hypertension, CAD, CHF, cocaine abuse, and alcohol abuse presents to the ED complaining of acute onset sharp chest pain radiating to his epigastrium and back around 6:00 this morning.  He is uncomfortable appearing and has significant tenderness in his epigastrium.  EKG shows sinus tachycardia with prolonged QT, likely secondary to his hypokalemia, nonspecific T wave changes but no obvious ischemic changes.  We will replete potassium and magnesium, hydrate with IV fluids.  We will need to further assess for dissection with CTA of his chest and abdomen, differential also includes ACS, PE, pancreatitis, pneumonia, pneumothorax.  Plan to treat pain with IV morphine for now.  Remainder of lab work thus far is reassuring, 2 sets of troponin are negative.  CTA is negative for dissection or other acute pathology.  Lipase within normal limits, patient noted to have mildly elevated LFTs consistent with alcohol abuse.  Despite this, he continues to have significant chest pain despite morphine and nitroglycerin.  Given concern for substance-induced chest pain, we will treat with Ativan.  Given his ongoing severe chest pain in the setting of history of CAD,  we will admit for further management.      ____________________________________________   FINAL CLINICAL IMPRESSION(S) /  ED DIAGNOSES  Final diagnoses:  Epigastric pain  Chest pain, unspecified type  Polysubstance abuse [facility])     ED Discharge Orders    None       Note:  This document was prepared using Dragon voice recognition software and may include unintentional dictation errors.   Blake Divine, MD 06/04/20 1544

## 2020-06-04 NOTE — ED Notes (Signed)
First Nurse Note: Pt to ED via [facility] from home for chest pain that started yesterday. Pt used Meth on Friday, normally uses cocaine. Pt also reports bloody stools for the past few weeks. Pt was given 325 mg ASA with EMS that he vomited back up. Pt was given 4 mg of IV Zofran. Pt given 1 spray of Ntg.

## 2020-06-04 NOTE — ED Notes (Signed)
Pt states chest pain started that "gradually woke him up". Pt stated he drank alcohol and used meth Friday night. Pt denies chronic SA but states daily ETOH use.

## 2020-06-04 NOTE — Progress Notes (Signed)
Report called to [place] RN for patient being transferred to room 236.

## 2020-06-04 NOTE — ED Notes (Signed)
Pt provided extra warm blanket, pt trx to CT.

## 2020-06-04 NOTE — ED Notes (Signed)
US at bedside

## 2020-06-04 NOTE — Consult Note (Signed)
CARDIOLOGY CONSULT NOTE  Patient ID: Ethan Wells MRN: [id number] DOB/AGE: 1971-02-08 49 y.o.  Admit date: 06/04/2020 Primary Physician Patient, No Pcp Per Primary Cardiologist    Lauree Chandler, MD Chief Complaint  Chest pain Requesting  [place] [facility]  HPI:   He has a history of coronary disease.  He had stenting as below.  Echo in July 2020 demonstrated an EF of 45 to 50%.  He has a history of medical noncompliance and ongoing substance abuse.   He presented with chest pain.   He said this happened last night.  He was not able to sleep because of it.  He describes it as sharp and dull.  It was in his left calf under his left nipple but now has radiated into his abdomen and mid chest.  He also has a pain in his back.  He has had associated nausea but not significant vomiting.  He has not had any chest fevers or chills.  He has had no new shortness of breath, PND or orthopnea.  He is limited in his activities.  He does not take his medications because they made him nauseated.  He says he only uses cocaine every 3 to 4 months.  He does not drink every day by his report.  He does not think this discomfort was similar to his pain last year.  He did not take anything at home to try to make this better except drink alcohol.  High-sensitivity troponin is negative.  He was hyponatremic and hypokalemic as below.  Drug screen is positive for cocaine and amphetamines.  CT of the chest did not show any significant acute abnormalities.    We last saw him in July of last year for chest pain.  This was felt to be atypical.  Medical management was pursued.  Past Medical History:  Diagnosis Date  . CAD (coronary artery disease)    a. 01/2011 Anterior STEMI/Cath/PCI: LM nl, LAD 100d (3.5x69mm Vision BMS placed), LCX 63m, RI 50, RCA min irregs, EF 40% w/ apical, inferoapical HK.  . Cardiac arrest - ventricular fibrillation    a. In setting of STEMI 01/2011  . Cocaine abuse ([place])   . ETOH abuse     a. 6-12 beers/day  . Hemorrhoids   . Hypertension   . Ischemic cardiomyopathy    a. 06/2011 Echo: EF 45-50%, No rwma  . Marijuana abuse   . Tobacco abuse    a. 1/2 ppd x 26 yrs    Past Surgical History:  Procedure Laterality Date  . CORONARY ANGIOPLASTY WITH STENT PLACEMENT      Allergies  Allergen Reactions  . Penicillins Hives    Prior to Admission medications   Medication Sig Start Date End Date Taking? Authorizing Provider  carvedilol (COREG) 6.25 MG tablet Take 1 tablet (6.25 mg total) by mouth 2 (two) times daily with a meal. 07/15/19 08/14/19  Pyreddy, Reatha Harps, MD  dicyclomine (BENTYL) 20 MG tablet Take 1 tablet (20 mg total) by mouth every 6 (six) hours. 04/25/20   Paulette Blanch, MD  feeding supplement, ENSURE ENLIVE, (ENSURE ENLIVE) LIQD Take 237 mLs by mouth 2 (two) times daily between meals. 05/27/18   Salary, Avel Peace, MD  gabapentin (NEURONTIN) 100 MG capsule Take 1 capsule (100 mg total) by mouth 3 (three) times daily. 06/23/19   Domenic Moras, PA-C  isosorbide mononitrate (IMDUR) 30 MG 24 hr tablet Take 1 tablet (30 mg total) by mouth daily. 07/16/19 08/15/19  Saundra Shelling, MD  lactulose (CHRONULAC) 10 GM/15ML solution Take 30 mLs (20 g total) by mouth daily as needed for mild constipation. 04/25/20   Paulette Blanch, MD  lisinopril (ZESTRIL) 2.5 MG tablet Take 1 tablet (2.5 mg total) by mouth daily. 07/16/19 08/15/19  Saundra Shelling, MD  nicotine (NICODERM CQ - DOSED IN MG/24 HOURS) 21 mg/24hr patch Place 1 patch (21 mg total) onto the skin daily. 07/16/19   Saundra Shelling, MD  nitroGLYCERIN (NITROSTAT) 0.4 MG SL tablet Place 1 tablet (0.4 mg total) under the tongue every 5 (five) minutes as needed for chest pain. 07/15/19   Saundra Shelling, MD  omeprazole (PRILOSEC OTC) 20 MG tablet Take 1 tablet (20 mg total) by mouth daily. 06/17/19 06/16/20  Hinda Kehr, MD   Allergies  Allergen Reactions  . Penicillins Hives    (Not in a hospital admission)  Family History  Problem Relation Age  of Onset  . Coronary artery disease Mother        alive  . Other Other        2 sisters alive and well  . Alcohol abuse Father        died @ 43    Social History   Socioeconomic History  . Marital status: Single    Spouse name: Not on file  . Number of children: Not on file  . Years of education: Not on file  . Highest education level: Not on file  Occupational History  . Not on file  Tobacco Use  . Smoking status: Current Every Day Smoker    Packs/day: 0.50    Years: 26.00    Pack years: 13.00    Types: Cigarettes  . Smokeless tobacco: Never Used  Substance and Sexual Activity  . Alcohol use: Yes    Comment: at least a 6 pack of beer daily and often a 12 pack  . Drug use: Yes    Types: Cocaine, Marijuana    Comment: denies cocaine; marijuana 2 days ago  . Sexual activity: Not on file  Other Topics Concern  . Not on file  Social History Narrative   Single, lives in [place] with his mother.  He does not routinely exercise.  He sometimes works, helping to deliver appliances to homes.   Social Determinants of Health   Financial Resource Strain:   . Difficulty of Paying Living Expenses:   Food Insecurity:   . Worried About Charity fundraiser in the Last Year:   . Arboriculturist in the Last Year:   Transportation Needs:   . Film/video editor (Medical):   Marland Kitchen Lack of Transportation (Non-Medical):   Physical Activity:   . Days of Exercise per Week:   . Minutes of Exercise per Session:   Stress:   . Feeling of Stress :   Social Connections:   . Frequency of Communication with Friends and Family:   . Frequency of Social Gatherings with Friends and Family:   . Attends Religious Services:   . Active Member of Clubs or Organizations:   . Attends Archivist Meetings:   Marland Kitchen Marital Status:   Intimate Partner Violence:   . Fear of Current or Ex-Partner:   . Emotionally Abused:   Marland Kitchen Physically Abused:   . Sexually Abused:      ROS:    As stated in the  HPI and negative for all other systems.  Physical Exam: Blood pressure (!) 144/105, pulse (!) 104, temperature 97.9 F (36.6 C), temperature source  Oral, resp. rate 15, height 5\' 5"  (1.651 m), weight 81.6 kg, SpO2 99 %.  GENERAL:  Well appearing HEENT:  Pupils equal round and reactive, fundi not visualized, oral mucosa unremarkable NECK:  No jugular venous distention, waveform within normal limits, carotid upstroke brisk and symmetric, no bruits, no thyromegaly LYMPHATICS:  No cervical, inguinal adenopathy LUNGS:  Clear to auscultation bilaterally BACK:  No CVA tenderness CHEST:  Unremarkable HEART:  PMI not displaced or sustained,S1 and S2 within normal limits, no S3, no S4, no clicks, no rubs, no murmurs ABD:  Flat, positive bowel sounds normal in frequency in pitch, no bruits, no rebound, no guarding, no midline pulsatile mass, no hepatomegaly, no splenomegaly EXT:  2 plus pulses throughout, no edema, no cyanosis no clubbing SKIN:  No rashes no nodules NEURO:  Cranial nerves II through XII grossly intact, motor grossly intact throughout PSYCH:  Cognitively intact, oriented to person place and time  Labs: Lab Results  Component Value Date   BUN <5 (L) 06/04/2020   Lab Results  Component Value Date   CREATININE 0.77 06/04/2020   Lab Results  Component Value Date   NA 129 (L) 06/04/2020   K 2.6 (LL) 06/04/2020   CL 95 (L) 06/04/2020   CO2 17 (L) 06/04/2020   Lab Results  Component Value Date   TROPONINI <0.03 05/25/2018   Lab Results  Component Value Date   WBC 12.2 (H) 06/04/2020   HGB 16.2 06/04/2020   HCT RESULTS UNAVAILABLE DUE TO INTERFERING SUBSTANCE 06/04/2020   MCV RESULTS UNAVAILABLE DUE TO INTERFERING SUBSTANCE 06/04/2020   PLT 215 06/04/2020   Lab Results  Component Value Date   CHOL 193 07/13/2019   HDL 53 07/13/2019   LDLCALC 118 (H) 07/13/2019   TRIG 109 07/13/2019   CHOLHDL 3.6 07/13/2019   Lab Results  Component Value Date   ALT 23 06/04/2020     AST 47 (H) 06/04/2020   ALKPHOS 163 (H) 06/04/2020   BILITOT 1.8 (H) 06/04/2020      Radiology:   CXR:   No acute disease.  HQR:FXJOI tachycardia, rate 108, diffuse nonspecific T wave changes, poor anterior R wave progression, QTC prolonged.     ASSESSMENT AND PLAN:   CHEST PAIN: Chest pain is atypical.  Enzymes thus far negative.  There are no acute ST segment changes on his EKG.  I would rule out with enzymes and repeat an EKG in the morning.  However, given the atypical nature I would not suspect that he is in need any invasive or noninvasive testing.  Should avoid beta-blockers because of the positive drug screen.  PROLONGED QT: Avoid QT prolonging drug.   CAD: As above.  CARDIOMYOPATHY: Needs to restart ACE inhibitor on admission.  Avoid beta-blockers for now although eventually need to start this.   SignedMinus Breeding 06/04/2020, 3:56 PM

## 2020-06-04 NOTE — ED Notes (Signed)
[place] sent to lab at this time.  

## 2020-06-05 DIAGNOSIS — F1410 Cocaine abuse, uncomplicated: Secondary | ICD-10-CM | POA: Diagnosis not present

## 2020-06-05 DIAGNOSIS — I10 Essential (primary) hypertension: Secondary | ICD-10-CM | POA: Diagnosis not present

## 2020-06-05 DIAGNOSIS — R109 Unspecified abdominal pain: Secondary | ICD-10-CM | POA: Diagnosis not present

## 2020-06-05 DIAGNOSIS — I25119 Atherosclerotic heart disease of native coronary artery with unspecified angina pectoris: Secondary | ICD-10-CM | POA: Diagnosis not present

## 2020-06-05 DIAGNOSIS — I5022 Chronic systolic (congestive) heart failure: Secondary | ICD-10-CM | POA: Diagnosis not present

## 2020-06-05 DIAGNOSIS — R079 Chest pain, unspecified: Secondary | ICD-10-CM | POA: Diagnosis not present

## 2020-06-05 DIAGNOSIS — R0789 Other chest pain: Secondary | ICD-10-CM | POA: Diagnosis not present

## 2020-06-05 LAB — COMPREHENSIVE METABOLIC PANEL
ALT: 22 U/L (ref 0–44)
AST: 39 U/L (ref 15–41)
Albumin: 3 g/dL — ABNORMAL LOW (ref 3.5–5.0)
Alkaline Phosphatase: 161 U/L — ABNORMAL HIGH (ref 38–126)
Anion gap: 8 (ref 5–15)
BUN: 7 mg/dL (ref 6–20)
CO2: 25 mmol/L (ref 22–32)
Calcium: 8.1 mg/dL — ABNORMAL LOW (ref 8.9–10.3)
Chloride: 101 mmol/L (ref 98–111)
Creatinine, Ser: 0.61 mg/dL (ref 0.61–1.24)
GFR calc Af Amer: >60 mL/min (ref >60)
GFR calc non Af Amer: >60 mL/min (ref >60)
Glucose, Bld: 87 mg/dL (ref 70–99)
Potassium: 3.1 mmol/L — ABNORMAL LOW (ref 3.5–5.1)
Sodium: 134 mmol/L — ABNORMAL LOW (ref 135–145)
Total Bilirubin: 2.4 mg/dL — ABNORMAL HIGH (ref 0.3–1.2)
Total Protein: 7.2 g/dL (ref 6.5–8.1)

## 2020-06-05 LAB — BASIC METABOLIC PANEL
Anion gap: 7 (ref 5–15)
BUN: 7 mg/dL (ref 6–20)
CO2: 23 mmol/L (ref 22–32)
Calcium: 8.2 mg/dL — ABNORMAL LOW (ref 8.9–10.3)
Chloride: 103 mmol/L (ref 98–111)
Creatinine, Ser: 0.58 mg/dL — ABNORMAL LOW (ref 0.61–1.24)
GFR calc Af Amer: >60 mL/min (ref >60)
GFR calc non Af Amer: >60 mL/min (ref >60)
Glucose, Bld: 125 mg/dL — ABNORMAL HIGH (ref 70–99)
Potassium: 3.8 mmol/L (ref 3.5–5.1)
Sodium: 133 mmol/L — ABNORMAL LOW (ref 135–145)

## 2020-06-05 LAB — TROPONIN I (HIGH SENSITIVITY): Troponin I (High Sensitivity): 7 ng/L (ref <18)

## 2020-06-05 LAB — CBC
HCT: 38.5 % — ABNORMAL LOW (ref 39.0–52.0)
Hemoglobin: 14.4 g/dL (ref 13.0–17.0)
MCH: 38.1 pg — ABNORMAL HIGH (ref 26.0–34.0)
MCHC: 37.4 g/dL — ABNORMAL HIGH (ref 30.0–36.0)
MCV: 101.9 fL — ABNORMAL HIGH (ref 80.0–100.0)
Platelets: 155 10*3/uL (ref 150–400)
RBC: 3.78 MIL/uL — ABNORMAL LOW (ref 4.22–5.81)
RDW: 15.8 % — ABNORMAL HIGH (ref 11.5–15.5)
WBC: 7.4 10*3/uL (ref 4.0–10.5)
nRBC: 0 % (ref 0.0–0.2)

## 2020-06-05 LAB — LIPID PANEL
Cholesterol: 147 mg/dL (ref 0–200)
HDL: 21 mg/dL — ABNORMAL LOW (ref >40)
LDL Cholesterol: 103 mg/dL — ABNORMAL HIGH (ref 0–99)
Total CHOL/HDL Ratio: 7 RATIO
Triglycerides: 115 mg/dL (ref <150)
VLDL: 23 mg/dL (ref 0–40)

## 2020-06-05 LAB — HEMOGLOBIN A1C
Hgb A1c MFr Bld: 5.4 % (ref 4.8–5.6)
Mean Plasma Glucose: 108 mg/dL

## 2020-06-05 LAB — TSH: TSH: 1.438 u[IU]/mL (ref 0.350–4.500)

## 2020-06-05 LAB — MAGNESIUM: Magnesium: 2.1 mg/dL (ref 1.7–2.4)

## 2020-06-05 MED ORDER — POTASSIUM CHLORIDE CRYS ER 20 MEQ PO TBCR
40.0000 meq | EXTENDED_RELEASE_TABLET | Freq: Every day | ORAL | Status: DC
  Administered 2020-06-05 – 2020-06-06 (×2): 40 meq via ORAL
  Filled 2020-06-05 (×2): qty 2

## 2020-06-05 MED ORDER — LISINOPRIL 5 MG PO TABS
5.0000 mg | ORAL_TABLET | Freq: Every day | ORAL | Status: DC
  Administered 2020-06-05 – 2020-06-06 (×2): 5 mg via ORAL
  Filled 2020-06-05 (×2): qty 1

## 2020-06-05 MED ORDER — NITROGLYCERIN 0.4 MG SL SUBL: 0.4000 mg | SUBLINGUAL_TABLET | SUBLINGUAL | Status: DC | PRN

## 2020-06-05 MED ORDER — POTASSIUM CHLORIDE 10 MEQ/100ML IV SOLN
10.0000 meq | INTRAVENOUS | Status: AC
  Administered 2020-06-05 (×4): 10 meq via INTRAVENOUS
  Filled 2020-06-05 (×4): qty 100

## 2020-06-05 MED ORDER — OXYCODONE-ACETAMINOPHEN 5-325 MG PO TABS
1.0000 | ORAL_TABLET | Freq: Four times a day (QID) | ORAL | Status: DC | PRN
  Administered 2020-06-05 – 2020-06-06 (×4): 1 via ORAL
  Filled 2020-06-05 (×4): qty 1

## 2020-06-05 MED ORDER — TRAMADOL HCL 50 MG PO TABS: 50.0000 mg | ORAL_TABLET | Freq: Four times a day (QID) | ORAL | Status: DC | PRN

## 2020-06-05 MED ORDER — MORPHINE SULFATE (PF) 2 MG/ML IV SOLN
1.0000 mg | Freq: Four times a day (QID) | INTRAVENOUS | Status: DC | PRN
  Administered 2020-06-05: 1 mg via INTRAVENOUS

## 2020-06-05 NOTE — Plan of Care (Signed)
  Problem: Pain Managment: Goal: General experience of comfort will improve Outcome: Progressing   Problem: Safety: Goal: Ability to remain free from injury will improve Outcome: Progressing   

## 2020-06-05 NOTE — Progress Notes (Addendum)
[facility] NAME: Kamarri Lovvorn    MR#:  [id number]  DATE OF BIRTH:  [birth year]  SUBJECTIVE:   Complains of generalized body ache epigastric abdominal pain and chest pain. Unable to describe anything in detail. Poor historian. Noncompliant with follow-up. Does not take his home meds. Urine drug screen positive for polysubstance. Continues to drink alcohol every now and then although tells me he hasn't had beer in two weeks. Earlier to the nurse he has said to the nurse here drink beer two days ago.  REVIEW OF SYSTEMS:   Review of Systems  Constitutional: Negative for chills, fever and weight loss.  HENT: Negative for ear discharge, ear pain and nosebleeds.   Eyes: Negative for blurred vision, pain and discharge.  Respiratory: Negative for sputum production, shortness of breath, wheezing and stridor.   Cardiovascular: Positive for chest pain. Negative for palpitations, orthopnea and PND.  Gastrointestinal: Positive for abdominal pain. Negative for diarrhea, nausea and vomiting.  Genitourinary: Negative for frequency and urgency.  Musculoskeletal: Positive for back pain and joint pain.  Neurological: Negative for sensory change, speech change, focal weakness and weakness.  Psychiatric/Behavioral: Negative for depression and hallucinations. The patient is not nervous/anxious.    Tolerating Diet:yes Tolerating PT: not needed  DRUG ALLERGIES:   Allergies  Allergen Reactions  . Penicillins Hives    VITALS:  Blood pressure 114/81, pulse (!) 105, temperature 97.8 F (36.6 C), resp. rate 20, height 5\' 5"  (1.651 m), weight 79.6 kg, SpO2 98 %.  PHYSICAL EXAMINATION:   Physical Exam  GENERAL:  49 y.o.-year-old patient lying in the bed with no acute distress.  disheveled EYES: Pupils equal, round, reactive to light and accommodation. No scleral icterus.   HEENT: Head atraumatic, normocephalic. Oropharynx and nasopharynx clear.   NECK:  Supple, no jugular venous distention. No thyroid enlargement, no tenderness.  LUNGS: Normal breath sounds bilaterally, no wheezing, rales, rhonchi. No use of accessory muscles of respiration.  CARDIOVASCULAR: S1, S2 normal. No murmurs, rubs, or gallops.  ABDOMEN: Soft, nontender, nondistended. Bowel sounds present. No organomegaly or mass.  EXTREMITIES: No cyanosis, clubbing or edema b/l.    NEUROLOGIC: Cranial nerves II through XII are intact. No focal Motor or sensory deficits b/l.   PSYCHIATRIC:  patient is alert and oriented x 2.  SKIN: No obvious rash, lesion, or ulcer.   LABORATORY PANEL:  CBC Recent Labs  Lab 06/05/20 0203  WBC 7.4  HGB 14.4  HCT 38.5*  PLT 155    Chemistries  Recent Labs  Lab 06/05/20 0203 06/05/20 0203 06/05/20 1109  NA 134*   < > 133*  K 3.1*   < > 3.8  CL 101   < > 103  CO2 25   < > 23  GLUCOSE 87   < > 125*  BUN 7   < > 7  CREATININE 0.61   < > 0.58*  CALCIUM 8.1*   < > 8.2*  MG 2.1  --   --   AST 39  --   --   ALT 22  --   --   ALKPHOS 161*  --   --   BILITOT 2.4*  --   --    < > = values in this interval not displayed.   Cardiac Enzymes No results for input(s): TROPONINI in the last 168 hours. RADIOLOGY:  DG Chest Portable 1 View  Result Date: 06/04/2020 CLINICAL DATA:  Chest pain. EXAM: PORTABLE  CHEST 1 VIEW COMPARISON:  04/25/2020 FINDINGS: Lungs are adequately inflated without focal airspace consolidation or effusion. Borderline stable cardiomegaly. Remainder of the exam is unchanged. IMPRESSION: No active disease. Electronically Signed   By: Marin Olp M.D.   On: 06/04/2020 10:58   CT Angio Chest/Abd/Pel for Dissection W and/or W/WO  Result Date: 06/04/2020 CLINICAL DATA:  Chest pain. EXAM: CT ANGIOGRAPHY CHEST, ABDOMEN AND PELVIS TECHNIQUE: Non-contrast CT of the chest was initially obtained. Multidetector CT imaging through the chest, abdomen and pelvis was performed using the standard protocol during bolus  administration of intravenous contrast. Multiplanar reconstructed images and MIPs were obtained and reviewed to evaluate the vascular anatomy. CONTRAST:  180mL OMNIPAQUE IOHEXOL 350 MG/ML SOLN COMPARISON:  CT abdomen pelvis dated 04/19/2018. FINDINGS: CTA CHEST FINDINGS Cardiovascular: Preferential opacification of the thoracic aorta. No evidence of thoracic aortic aneurysm or dissection. Vascular calcifications are seen in the coronary arteries. Normal heart size. No pericardial effusion. Mediastinum/Nodes: No enlarged mediastinal, hilar, or axillary lymph nodes. Thyroid gland, trachea, and esophagus demonstrate no significant findings. Lungs/Pleura: Lungs are clear. No pleural effusion or pneumothorax. Musculoskeletal: No chest wall abnormality. No acute or significant osseous findings. Review of the MIP images confirms the above findings. CTA ABDOMEN AND PELVIS FINDINGS VASCULAR Aorta: Mild atherosclerotic disease. Normal caliber aorta without aneurysm, dissection, vasculitis or significant stenosis. Celiac: Patent without evidence of aneurysm, dissection, vasculitis or significant stenosis. SMA: Patent without evidence of aneurysm, dissection, vasculitis or significant stenosis. Renals: Both renal arteries are patent without evidence of aneurysm, dissection, vasculitis, fibromuscular dysplasia or significant stenosis. IMA: Patent without evidence of aneurysm, dissection, vasculitis or significant stenosis. Inflow: Mild-to-moderate atherosclerotic disease without evidence of aneurysm, dissection, vasculitis or significant stenosis. Veins: No obvious venous abnormality within the limitations of this arterial phase study. Review of the MIP images confirms the above findings. NON-VASCULAR Hepatobiliary: The liver is hypoattenuating, consistent with hepatic steatosis. No focal liver abnormality is seen. No gallstones, gallbladder wall thickening, or biliary dilatation. Pancreas: Unremarkable. No pancreatic ductal  dilatation or surrounding inflammatory changes. Spleen: Normal in size without focal abnormality. Adrenals/Urinary Tract: Adrenal glands are unremarkable. Kidneys are normal, without renal calculi, focal lesion, or hydronephrosis. Bladder is unremarkable. Stomach/Bowel: Stomach is within normal limits. No pericecal inflammatory changes are noted to suggest acute appendicitis. No evidence of bowel wall thickening, distention, or inflammatory changes. Lymphatic: No abnormally enlarged lymph nodes in the abdomen or pelvis. Reproductive: Prostate is unremarkable. Other: No abdominal wall hernia or abnormality. No abdominopelvic ascites. Musculoskeletal: No acute or significant osseous findings. Review of the MIP images confirms the above findings. IMPRESSION: 1. No evidence of aortic aneurysm or dissection. 2. No acute process in the chest, abdomen, or pelvis. 3. Hepatic steatosis. Aortic Atherosclerosis (ICD10-I70.0). Electronically Signed   By: Zerita Boers M.D.   On: 06/04/2020 12:43   US Abdomen Limited RUQ  Result Date: 06/04/2020 CLINICAL DATA:  Epigastric pain. EXAM: ULTRASOUND ABDOMEN LIMITED RIGHT UPPER QUADRANT COMPARISON:  None. FINDINGS: Gallbladder: No gallstones or wall thickening visualized. No sonographic Murphy sign noted by sonographer. Common bile duct: Diameter: 2.9 mm Liver: Increased echogenicity. No focal mass. Portal vein is patent on color Doppler imaging with normal direction of blood flow towards the liver. Other: None. IMPRESSION: 1. The gallbladder and common bile duct are normal in appearance. 2. Hepatic steatosis. Electronically Signed   By: Dorise Bullion III M.D   On: 06/04/2020 15:47   ASSESSMENT AND PLAN:  Ethan Wells is a 49 y.o. male with medical history  significant of hypertension, polysubstance abuse (tobacco, alcohol, amphetamine, cocaine, marijuana), CAD, STEMI, BMS placement, cardiac arrest due to V.  Fib, sCHF with EF of 45-50%, who presents with chest  pain. Patient states that his chest pain started last night, which is located in the substernal area, constant, pressure-like, 7 out of 10 severity, nonradiating.  Associated with mild shortness of breath.  Chest pain and hx of CAD: s/p of BMS placement. Troponin negative, 7 -->8.  UDS positive for amphetamine, cocaine, marijuana, which is likely partially contributed.  Cardiology, Dr. Percival Spanish input appreciated--appears atypical pain -cont asa, added lisinopril -cannot do BB due to positive UDS for cocaine -prn Nitroglycerin -no further cardiac workup recommended at this time.  Polysubstance abuse: tobacco, alcohol, amphetamine, cocaine, marijuana abuse. -Did counseling about importance of quitting substance -CIWA protocol--scored high this morning -Nicotine patch  HYPERTENSION, BENIGN: not taking meds -IV hydralazine as needed -started on low-dose lisinopril  Hypomagnesemia and hypokalemia: -Replaced both  Hyponatremia: Sodium 129, mental status normal, likely due to potomania 2/2 alcohol abuse -sodium improving  Chronic systolic CHF (congestive heart failure) ([facility]): 2D echo on 07/05/2019 showed EF of 45-50%.  Patient does not have leg edema or JVD.  CHF symptoms compensated. - BNP--160  Leukocytosis: -wbc today 7.4  Abdominal pain: Patient has mild abnormal liver function with AST 47, ALT 23, total bilirubin 1.3 which is likely due to alcohol abuse .His abdominal pain is in the epigastric area, likely due to alcoholic gastritis.  Lipase normal -cont Protonix  DVT ppx: SQ Lovenox Code Status: Full code Family Communication: not done, no family member is at bed side.    Disposition Plan:  Anticipate discharge back to previous environment Consults called: Dr. Percival Spanish of cardiology Admission status: progressive unit for obs   TOC for discharge planning. Patient has been noncompliant with follow-up and oral meds. Social worker to counsel regarding substance  abuse.   Status is: Observation   patient remains OBS appropriate and will d/c before 2 midnights.  Dispo: The patient is from: Home  Anticipated d/c is to: Home  Anticipated d/c date is: 1 day  Patient currently is not medically stable to d/c. scored high on CIWA this morning--will monitor one more days  TOTAL TIME TAKING CARE OF THIS PATIENT: *30* minutes.  >50% time spent on counselling and coordination of care  Note: This dictation was prepared with Dragon dictation along with smaller phrase technology. Any transcriptional errors that result from this process are unintentional.  Fritzi Mandes M.D    [facility]   CC: Primary care physician; Patient, No Pcp PerPatient ID: [name], male   DOB: 02-01-1971, 49 y.o.   MRN: [id number]

## 2020-06-05 NOTE — Progress Notes (Signed)
MD made aware of the 3 type 7 stool bowel movements the patient has had today. MD will put in new orders.

## 2020-06-05 NOTE — Progress Notes (Addendum)
Pt was admitted on the floor with no signs of distress. Pt alert x4. VSS. Pt was educated about safety and ascom within reach. Pt has a personal belongings locked in a security lock box and a 6 pcs of cigarette and 1 lighter in pt bins. Will notify incoming shift. Will continue to monitor.  Update 0321: Pt potassium at 3.1. Notify NP Ethan Wells. Will continue to monitor.  Update 0400: B Ethan Wells ordered  Potassium 10 mEq 100 ml IVPB x 4. Will continue to monitor.

## 2020-06-05 NOTE — Progress Notes (Signed)
Progress Note  Patient Name: Ethan Wells Date of Encounter: 06/05/2020  Primary Cardiologist: Angelena Form  Subjective   Somnolent this afternoon. Has continued to note vague generalized myalgias. HS-Tn negative x 5. Repeat EKG unchanged. BP improved.   Inpatient Medications    Scheduled Meds: . aspirin  81 mg Oral Daily  . enoxaparin (LOVENOX) injection  40 mg Subcutaneous Q24H  . folic acid  1 mg Oral Daily  . lisinopril  5 mg Oral Daily  . LORazepam  0-4 mg Intravenous Q6H   Followed by  . [START ON 06/06/2020] LORazepam  0-4 mg Intravenous Q12H  . multivitamin with minerals  1 tablet Oral Daily  . nicotine  21 mg Transdermal Daily  . pantoprazole  40 mg Oral Q1200  . potassium chloride  40 mEq Oral Daily  . thiamine  100 mg Oral Daily   Or  . thiamine  100 mg Intravenous Daily   Continuous Infusions:  PRN Meds: hydrALAZINE, LORazepam **OR** LORazepam, nitroGLYCERIN, oxyCODONE-acetaminophen, traMADol   Vital Signs    Vitals:   06/05/20 0317 06/05/20 0600 06/05/20 0741 06/05/20 1137  BP: (!) 129/93 132/85 128/85 114/81  Pulse: 90 96 100 (!) 105  Resp: 20 20 18 20   Temp: 98.3 F (36.8 C) 98.7 F (37.1 C) 97.9 F (36.6 C) 97.8 F (36.6 C)  TempSrc: Oral Oral Oral   SpO2: 100% 99% 100% 98%  Weight:      Height:        Intake/Output Summary (Last 24 hours) at 06/05/2020 1425 Last data filed at 06/05/2020 1048 Gross per 24 hour  Intake 870.73 ml  Output 1050 ml  Net -179.27 ml   Filed Weights   06/04/20 0846 06/04/20 2303  Weight: 81.6 kg 79.6 kg    Telemetry    SR with sinus tachycardiac with rates in the 90s to 120s bpm - Personally Reviewed  ECG    NSR, 91 bpm, nonspecific st/t changes largely unchanged from prior - Personally Reviewed  Physical Exam   GEN: No acute distress.   Neck: No JVD. Cardiac: RRR, no murmurs, rubs, or gallops.  Respiratory: Clear to auscultation bilaterally.  GI: Soft, nontender, non-distended.   MS: No  edema; No deformity. Neuro:  Alert and oriented x 3; Nonfocal.  Psych: Normal affect.  Labs    Chemistry Recent Labs  Lab 06/04/20 0850 06/04/20 0850 06/04/20 1946 06/05/20 0203 06/05/20 1109  NA 129*   < > 132* 134* 133*  K 2.6*   < > 3.4* 3.1* 3.8  CL 95*   < > 100 101 103  CO2 17*   < > 23 25 23   GLUCOSE 122*   < > 116* 87 125*  BUN <5*   < > 5* 7 7  CREATININE 0.77   < > 0.63 0.61 0.58*  CALCIUM 8.1*   < > 7.7* 8.1* 8.2*  PROT 7.0  --   --  7.2  --   ALBUMIN 2.8*  --   --  3.0*  --   AST 47*  --   --  39  --   ALT 23  --   --  22  --   ALKPHOS 163*  --   --  161*  --   BILITOT 1.8*  --   --  2.4*  --   GFRNONAA >60   < > >60 >60 >60  GFRAA >60   < > >60 >60 >60  ANIONGAP 17*   < >  9 8 7    < > = values in this interval not displayed.     Hematology Recent Labs  Lab 06/04/20 0850 06/05/20 0203  WBC 12.2* 7.4  RBC RESULTS UNAVAILABLE DUE TO INTERFERING SUBSTANCE 3.78*  HGB 16.2 14.4  HCT RESULTS UNAVAILABLE DUE TO INTERFERING SUBSTANCE 38.5*  MCV RESULTS UNAVAILABLE DUE TO INTERFERING SUBSTANCE 101.9*  MCH RESULTS UNAVAILABLE DUE TO INTERFERING SUBSTANCE 38.1*  MCHC RESULTS UNAVAILABLE DUE TO INTERFERING SUBSTANCE 37.4*  RDW RESULTS UNAVAILABLE DUE TO INTERFERING SUBSTANCE 15.8*  PLT 215 155    Cardiac EnzymesNo results for input(s): TROPONINI in the last 168 hours. No results for input(s): TROPIPOC in the last 168 hours.   BNP Recent Labs  Lab 06/04/20 1946  BNP 160.7*     DDimer No results for input(s): DDIMER in the last 168 hours.   Radiology    DG Chest Portable 1 View  Result Date: 06/04/2020 IMPRESSION: No active disease. Electronically Signed   By: Marin Olp M.D.   On: 06/04/2020 10:58   CT Angio Chest/Abd/Pel for Dissection W and/or W/WO  Result Date: 06/04/2020 IMPRESSION: 1. No evidence of aortic aneurysm or dissection. 2. No acute process in the chest, abdomen, or pelvis. 3. Hepatic steatosis. Aortic Atherosclerosis (ICD10-I70.0).  Electronically Signed   By: Zerita Boers M.D.   On: 06/04/2020 12:43   US Abdomen Limited RUQ  Result Date: 06/04/2020 IMPRESSION: 1. The gallbladder and common bile duct are normal in appearance. 2. Hepatic steatosis. Electronically Signed   By: Dorise Bullion III M.D   On: 06/04/2020 15:47    Cardiac Studies   2D echo 06/2019: 1. Moderate hypokinesis of the left ventricular, entire anterior wall.  2. The left ventricle has mildly reduced systolic function, with an  ejection fraction of 45-50%. The cavity size was normal. Indeterminate  diastolic filling due to E-A fusion.  3. The aortic valve was not well visualized. Mild thickening of the  aortic valve. Mild calcification of the aortic valve.  4. The aorta is abnormal in size and structure.  5. There is mild dilatation of the aortic root measuring 37 mm.  6. The interatrial septum was not well visualized.  Patient Profile     49 y.o. male with history of CAD with anterior ST elevation MI in 01/2011 s/p PCI/BMS to the LAD, polysubstance use with ongoing cocaine, amphetamines, tobacco, marijuana, and alcohol use, HFrEF secondary to ICM, and medical noncompliance who we are seeing for chest pain.   Assessment & Plan    1. Atypical chest pain: -HS-Tn negative x 5 -Repeat EKG not consistent with ACS and largely unchanged -No plans for inpatient ischemic testing -Discontinue cocaine and amphetamines  -Avoid beta blockers with ongoing cocaine use  2. Prolonged QT: -Stable -Avoid QT-prolonging medications  3. CAD: -As above -Continue ASA  4. HFrEF secondary to ICM: -He appears euvolemic -Avoid beta blocker with active cocaine use -Lisinopril  -Consider further escalation of GDMT in outpatient follow up  5. Hypokalemia: -Improved  6. Hypomagnesemia: -Repleted   For questions or updates, please contact [place] Please consult [email or web address] for contact info under Cardiology/STEMI.    Signed, Christell Faith,  PA-C [place] Pager: [phone] 06/05/2020, 2:25 PM

## 2020-06-05 NOTE — Plan of Care (Signed)
  Problem: Education: Goal: Knowledge of General Education information will improve Description: Including pain rating scale, medication(s)/side effects and non-pharmacologic comfort measures Outcome: Progressing   Problem: Activity: Goal: Risk for activity intolerance will decrease Outcome: Progressing   Problem: Nutrition: Goal: Adequate nutrition will be maintained Outcome: Progressing   Problem: Clinical Measurements: Goal: Diagnostic test results will improve Outcome: Progressing   Problem: Pain Managment: Goal: General experience of comfort will improve Outcome: Progressing   Problem: Safety: Goal: Ability to remain free from injury will improve Outcome: Progressing

## 2020-06-06 ENCOUNTER — Encounter: Payer: Self-pay | Admitting: Emergency Medicine

## 2020-06-06 ENCOUNTER — Other Ambulatory Visit: Payer: Self-pay

## 2020-06-06 DIAGNOSIS — F17210 Nicotine dependence, cigarettes, uncomplicated: Secondary | ICD-10-CM | POA: Insufficient documentation

## 2020-06-06 DIAGNOSIS — R69 Illness, unspecified: Secondary | ICD-10-CM | POA: Diagnosis not present

## 2020-06-06 DIAGNOSIS — F1410 Cocaine abuse, uncomplicated: Secondary | ICD-10-CM | POA: Diagnosis not present

## 2020-06-06 DIAGNOSIS — R109 Unspecified abdominal pain: Secondary | ICD-10-CM | POA: Diagnosis not present

## 2020-06-06 DIAGNOSIS — Z7982 Long term (current) use of aspirin: Secondary | ICD-10-CM | POA: Insufficient documentation

## 2020-06-06 DIAGNOSIS — Z022 Encounter for examination for admission to residential institution: Secondary | ICD-10-CM | POA: Insufficient documentation

## 2020-06-06 DIAGNOSIS — Z79899 Other long term (current) drug therapy: Secondary | ICD-10-CM | POA: Insufficient documentation

## 2020-06-06 DIAGNOSIS — R079 Chest pain, unspecified: Secondary | ICD-10-CM | POA: Diagnosis not present

## 2020-06-06 DIAGNOSIS — R0789 Other chest pain: Secondary | ICD-10-CM | POA: Diagnosis not present

## 2020-06-06 DIAGNOSIS — R5381 Other malaise: Secondary | ICD-10-CM | POA: Diagnosis not present

## 2020-06-06 DIAGNOSIS — I2510 Atherosclerotic heart disease of native coronary artery without angina pectoris: Secondary | ICD-10-CM | POA: Insufficient documentation

## 2020-06-06 DIAGNOSIS — I5022 Chronic systolic (congestive) heart failure: Secondary | ICD-10-CM | POA: Insufficient documentation

## 2020-06-06 DIAGNOSIS — I25119 Atherosclerotic heart disease of native coronary artery with unspecified angina pectoris: Secondary | ICD-10-CM | POA: Diagnosis not present

## 2020-06-06 DIAGNOSIS — I110 Hypertensive heart disease with heart failure: Secondary | ICD-10-CM | POA: Insufficient documentation

## 2020-06-06 DIAGNOSIS — I10 Essential (primary) hypertension: Secondary | ICD-10-CM | POA: Diagnosis not present

## 2020-06-06 MED ORDER — ATORVASTATIN CALCIUM 20 MG PO TABS: 40.0000 mg | ORAL_TABLET | Freq: Every day | ORAL | Status: DC

## 2020-06-06 MED ORDER — ATORVASTATIN CALCIUM 40 MG PO TABS: 40.0000 mg | ORAL_TABLET | Freq: Every day | ORAL | 0 refills | Status: DC

## 2020-06-06 MED ORDER — ASPIRIN 81 MG PO CHEW: 81.0000 mg | CHEWABLE_TABLET | Freq: Every day | ORAL | 0 refills | Status: DC

## 2020-06-06 MED ORDER — PANTOPRAZOLE SODIUM 40 MG PO TBEC: 40.0000 mg | DELAYED_RELEASE_TABLET | Freq: Every day | ORAL | 0 refills | Status: DC

## 2020-06-06 MED ORDER — THIAMINE HCL 100 MG PO TABS: 100.0000 mg | ORAL_TABLET | Freq: Every day | ORAL | 0 refills | Status: DC

## 2020-06-06 MED ORDER — LISINOPRIL 5 MG PO TABS: 5.0000 mg | ORAL_TABLET | Freq: Every day | ORAL | 0 refills | Status: DC

## 2020-06-06 MED ORDER — ADULT MULTIVITAMIN W/MINERALS CH: 1.0000 | ORAL_TABLET | Freq: Every day | ORAL | 0 refills | Status: DC

## 2020-06-06 MED ORDER — FOLIC ACID 1 MG PO TABS: 1.0000 mg | ORAL_TABLET | Freq: Every day | ORAL | 0 refills | Status: DC

## 2020-06-06 MED ORDER — NITROGLYCERIN 0.4 MG SL SUBL: 0.4000 mg | SUBLINGUAL_TABLET | SUBLINGUAL | 1 refills | Status: DC | PRN

## 2020-06-06 NOTE — Progress Notes (Signed)
Patient anxious to go home. Security called to give pt's belongings back. Lighter and cigarettes given back to patient. Discharge instructions gone over with patient. IV taken out and tele monitor off.   Update: Security at bedside with this RN returning belongings. Patient signed paperwork. Patient's ride waiting for him, volunteer wheeling patient down for discharge.

## 2020-06-06 NOTE — Plan of Care (Signed)

## 2020-06-06 NOTE — Discharge Instructions (Signed)
Pt advised not to use street drugs and NOT drink alcohol
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
## 2020-06-06 NOTE — Discharge Summary (Signed)
[facility] [place] NAME: Ethan Wells    MR#:  [id number]  DATE OF BIRTH:  05-Sep-1971  DATE OF ADMISSION:  06/04/2020 ADMITTING PHYSICIAN: Ivor Costa, MD  DATE OF DISCHARGE: 06/06/2020  PRIMARY CARE PHYSICIAN: Patient, No Pcp Per    ADMISSION DIAGNOSIS:  Epigastric pain [R10.13] Polysubstance abuse (HCC) [F19.10] Chest pain [R07.9] Chest pain, unspecified type [R07.9]  DISCHARGE DIAGNOSIS:  A Atypical Chest pain Epigastric pain suspected acute gastritis in the setting of drug abuse Polysubstance drug abuse SECONDARY DIAGNOSIS:   Past Medical History:  Diagnosis Date  . CAD (coronary artery disease)    a. 01/2011 Anterior STEMI/Cath/PCI: LM nl, LAD 100d (3.5x28mm Vision BMS placed), LCX 62m, RI 50, RCA min irregs, EF 40% w/ apical, inferoapical HK.  . Cardiac arrest - ventricular fibrillation    a. In setting of STEMI 01/2011  . Cocaine abuse ([place])   . ETOH abuse    a. 6-12 beers/day  . Hemorrhoids   . Hypertension   . Ischemic cardiomyopathy    a. 06/2011 Echo: EF 45-50%, No rwma  . Marijuana abuse   . Tobacco abuse    a. 1/2 ppd x 26 yrs    HOSPITAL COURSE:  Ethan Borum Childressis a 49 y.o.malewith medical history significant ofhypertension, polysubstance abuse (tobacco,alcohol, amphetamine, cocaine, marijuana), CAD, STEMI,BMSplacement, cardiac arrest due to V. Fib, sCHF with EF of 45-50%, who presents with chest pain. Patient states that his chest pain started last night, which is located in the substernal area, constant, pressure-like, 7 out of 10 severity, nonradiating. Associated with mild shortness of breath.  Chest painand hx of CAD: s/p of BMS placement.Troponin negative, 7 -->8.UDS positive for amphetamine, cocaine, marijuana, which is likely partially contributed. Cardiology, Dr. Percival Spanish input appreciated--appears atypical pain -cont asa, added lisinopril -Avoid BB due to positive UDS for  cocaine -prn Nitroglycerin -no further cardiac workup recommended at this time. -asa and statins  Polysubstance abuse:tobacco,alcohol, amphetamine, cocaine, marijuanaabuse. -Did counseling about importance of quitting substance -CIWA protocol was continued -Nicotine patch provided   HYPERTENSION, BENIGN: not taking meds -IV hydralazine as needed -started on low-dose lisinopril  Hypomagnesemiaandhypokalemia: -Replaced both  Hyponatremia:Sodium 129, mental status normal,likely due topotomania2/2alcohol abuse -sodium improving  Chronic systolic CHF (congestive heart failure) (HCC):2D echo on 07/05/2019 showed EF of 45-50%. Patient does not have leg edema orJVD. CHF symptoms compensated. - BNP--160  Leukocytosis: -wbc today 7.4  Abdominal pain:Patient has mild abnormal liver function with AST 47, ALT 23, total bilirubin 1.3 which is likely due to alcohol abuse.His abdominal pain is in the epigastric area,likely due toalcoholic gastritis. Lipase normal -cont Protonix  DVT ppx: SQ Lovenox Code Status:Full code Family Communication: Mother Ethan Wells on hte phone Disposition Plan: Anticipate discharge back to previous environment Consults called:Dr. Percival Spanish of cardiology Admission status: progressive unit for obs  TOC for discharge planning. Patient has been noncompliant with follow-up and oral meds. Social worker to counsel regarding substance abuse.  Patient advised to follow-up with cardiology Dr. Angelena Form. He is advised to stop using street drugs and alcohol. NO pain meds were prescribed.  Status is: Observation   patient remains OBS appropriate and will d/c before 2 midnights.  Dispo: The patient is from:Home Anticipated d/c is LT:JQZE Anticipated d/c date is: today Patient currently is  medically stable to d/c.   CONSULTS OBTAINED:  Treatment Team:  Minna Merritts, MD  DRUG  ALLERGIES:   Allergies  Allergen Reactions  . Penicillins Hives  DISCHARGE MEDICATIONS:   Allergies as of 06/06/2020      Reactions   Penicillins Hives      Medication List    TAKE these medications   aspirin 81 MG chewable tablet Chew 1 tablet (81 mg total) by mouth daily. Start taking on: June 07, 2020   atorvastatin 40 MG tablet Commonly known as: LIPITOR Take 1 tablet (40 mg total) by mouth daily.   folic acid 1 MG tablet Commonly known as: FOLVITE Take 1 tablet (1 mg total) by mouth daily. Start taking on: June 07, 2020   lisinopril 5 MG tablet Commonly known as: ZESTRIL Take 1 tablet (5 mg total) by mouth daily. Start taking on: June 07, 2020   multivitamin with minerals Tabs tablet Take 1 tablet by mouth daily. Start taking on: June 07, 2020   nitroGLYCERIN 0.4 MG SL tablet Commonly known as: NITROSTAT Place 1 tablet (0.4 mg total) under the tongue every 5 (five) minutes as needed for chest pain.   pantoprazole 40 MG tablet Commonly known as: PROTONIX Take 1 tablet (40 mg total) by mouth daily at 12 noon.   thiamine 100 MG tablet Take 1 tablet (100 mg total) by mouth daily. Start taking on: June 07, 2020       If you experience worsening of your admission symptoms, develop shortness of breath, life threatening emergency, suicidal or homicidal thoughts you must seek medical attention immediately by calling 911 or calling your MD immediately  if symptoms less severe.  You Must read complete instructions/literature along with all the possible adverse reactions/side effects for all the Medicines you take and that have been prescribed to you. Take any new Medicines after you have completely understood and accept all the possible adverse reactions/side effects.   Please note  You were cared for by a hospitalist during your hospital stay. If you have any questions about your discharge medications or the care you received while you were in the hospital  after you are discharged, you can call the unit and asked to speak with the hospitalist on call if the hospitalist that took care of you is not available. Once you are discharged, your primary care physician will handle any further medical issues. Please note that NO REFILLS for any discharge medications will be authorized once you are discharged, as it is imperative that you return to your primary care physician (or establish a relationship with a primary care physician if you do not have one) for your aftercare needs so that they can reassess your need for medications and monitor your lab values. Today   SUBJECTIVE   Irritable this am--wants pain meds Wants to go home. Was a bit disoriented per RN last night Mother her to pick him up  VITAL SIGNS:  Blood pressure 106/73, pulse 80, temperature 97.9 F (36.6 C), temperature source Oral, resp. rate 18, height 5\' 5"  (1.651 m), weight 79.6 kg, SpO2 95 %.  I/O:    Intake/Output Summary (Last 24 hours) at 06/06/2020 1132 Last data filed at 06/06/2020 0759 Gross per 24 hour  Intake 480 ml  Output 1000 ml  Net -520 ml    PHYSICAL EXAMINATION:  GENERAL:  49 y.o.-year-old patient lying in the bed with no acute distress. Disheveled  EYES: Pupils equal, round, reactive to light and accommodation. No scleral icterus.  HEENT: Head atraumatic, normocephalic. Oropharynx and nasopharynx clear.  NECK:  Supple, no jugular venous distention. No thyroid enlargement, no tenderness.  LUNGS: Normal breath sounds bilaterally, no  wheezing, rales,rhonchi or crepitation. No use of accessory muscles of respiration.  CARDIOVASCULAR: S1, S2 normal. No murmurs, rubs, or gallops.  ABDOMEN: Soft, non-tender, non-distended. Bowel sounds present. No organomegaly or mass.  EXTREMITIES: No pedal edema, cyanosis, or clubbing.  NEUROLOGIC: Cranial nerves II through XII are intact. Grossly nonfocal PSYCHIATRIC: The patient is alert and awake. Oriented to place and person   SKIN: No obvious rash, lesion, or ulcer.   DATA REVIEW:   CBC  Recent Labs  Lab 06/05/20 0203  WBC 7.4  HGB 14.4  HCT 38.5*  PLT 155    Chemistries  Recent Labs  Lab 06/05/20 0203 06/05/20 0203 06/05/20 1109  NA 134*   < > 133*  K 3.1*   < > 3.8  CL 101   < > 103  CO2 25   < > 23  GLUCOSE 87   < > 125*  BUN 7   < > 7  CREATININE 0.61   < > 0.58*  CALCIUM 8.1*   < > 8.2*  MG 2.1  --   --   AST 39  --   --   ALT 22  --   --   ALKPHOS 161*  --   --   BILITOT 2.4*  --   --    < > = values in this interval not displayed.    Microbiology Results   Recent Results (from the past 240 hour(s))  SARS Coronavirus 2 by RT PCR (hospital order, performed in [facility] hospital lab) Nasopharyngeal Nasopharyngeal Swab     Status: None   Collection Time: 06/04/20  1:27 PM   Specimen: Nasopharyngeal Swab  Result Value Ref Range Status   SARS Coronavirus 2 NEGATIVE NEGATIVE Final    Comment: (NOTE) SARS-CoV-2 target nucleic acids are NOT DETECTED.  The SARS-CoV-2 RNA is generally detectable in upper and lower respiratory specimens during the acute phase of infection. The lowest concentration of SARS-CoV-2 viral copies this assay can detect is 250 copies / mL. A negative result does not preclude SARS-CoV-2 infection and should not be used as the sole basis for treatment or other patient management decisions.  A negative result may occur with improper specimen collection / handling, submission of specimen other than nasopharyngeal swab, presence of viral mutation(s) within the areas targeted by this assay, and inadequate number of viral copies (<250 copies / mL). A negative result must be combined with clinical observations, patient history, and epidemiological information.  Fact Sheet for Patients:   StrictlyIdeas.no  Fact Sheet for Healthcare Providers: BankingDealers.co.za  This test is not yet approved or  cleared by the  [place] FDA and has been authorized for detection and/or diagnosis of SARS-CoV-2 by FDA under an Emergency Use Authorization (EUA).  This EUA will remain in effect (meaning this test can be used) for the duration of the COVID-19 declaration under Section 564(b)(1) of the Act, 21 U.S.C. section 360bbb-3(b)(1), unless the authorization is terminated or revoked sooner.  Performed at [facility], [address] [place], [place] [address]     RADIOLOGY:  CT Angio Chest/Abd/Pel for Dissection W and/or W/WO  Result Date: 06/04/2020 CLINICAL DATA:  Chest pain. EXAM: CT ANGIOGRAPHY CHEST, ABDOMEN AND PELVIS TECHNIQUE: Non-contrast CT of the chest was initially obtained. Multidetector CT imaging through the chest, abdomen and pelvis was performed using the standard protocol during bolus administration of intravenous contrast. Multiplanar reconstructed images and MIPs were obtained and reviewed to evaluate the vascular anatomy. CONTRAST:  19mL OMNIPAQUE IOHEXOL 350 MG/ML SOLN COMPARISON:  CT abdomen pelvis dated 04/19/2018. FINDINGS: CTA CHEST FINDINGS Cardiovascular: Preferential opacification of the thoracic aorta. No evidence of thoracic aortic aneurysm or dissection. Vascular calcifications are seen in the coronary arteries. Normal heart size. No pericardial effusion. Mediastinum/Nodes: No enlarged mediastinal, hilar, or axillary lymph nodes. Thyroid gland, trachea, and esophagus demonstrate no significant findings. Lungs/Pleura: Lungs are clear. No pleural effusion or pneumothorax. Musculoskeletal: No chest wall abnormality. No acute or significant osseous findings. Review of the MIP images confirms the above findings. CTA ABDOMEN AND PELVIS FINDINGS VASCULAR Aorta: Mild atherosclerotic disease. Normal caliber aorta without aneurysm, dissection, vasculitis or significant stenosis. Celiac: Patent without evidence of aneurysm, dissection, vasculitis or significant stenosis. SMA: Patent  without evidence of aneurysm, dissection, vasculitis or significant stenosis. Renals: Both renal arteries are patent without evidence of aneurysm, dissection, vasculitis, fibromuscular dysplasia or significant stenosis. IMA: Patent without evidence of aneurysm, dissection, vasculitis or significant stenosis. Inflow: Mild-to-moderate atherosclerotic disease without evidence of aneurysm, dissection, vasculitis or significant stenosis. Veins: No obvious venous abnormality within the limitations of this arterial phase study. Review of the MIP images confirms the above findings. NON-VASCULAR Hepatobiliary: The liver is hypoattenuating, consistent with hepatic steatosis. No focal liver abnormality is seen. No gallstones, gallbladder wall thickening, or biliary dilatation. Pancreas: Unremarkable. No pancreatic ductal dilatation or surrounding inflammatory changes. Spleen: Normal in size without focal abnormality. Adrenals/Urinary Tract: Adrenal glands are unremarkable. Kidneys are normal, without renal calculi, focal lesion, or hydronephrosis. Bladder is unremarkable. Stomach/Bowel: Stomach is within normal limits. No pericecal inflammatory changes are noted to suggest acute appendicitis. No evidence of bowel wall thickening, distention, or inflammatory changes. Lymphatic: No abnormally enlarged lymph nodes in the abdomen or pelvis. Reproductive: Prostate is unremarkable. Other: No abdominal wall hernia or abnormality. No abdominopelvic ascites. Musculoskeletal: No acute or significant osseous findings. Review of the MIP images confirms the above findings. IMPRESSION: 1. No evidence of aortic aneurysm or dissection. 2. No acute process in the chest, abdomen, or pelvis. 3. Hepatic steatosis. Aortic Atherosclerosis (ICD10-I70.0). Electronically Signed   By: Zerita Boers M.D.   On: 06/04/2020 12:43   US Abdomen Limited RUQ  Result Date: 06/04/2020 CLINICAL DATA:  Epigastric pain. EXAM: ULTRASOUND ABDOMEN LIMITED RIGHT  UPPER QUADRANT COMPARISON:  None. FINDINGS: Gallbladder: No gallstones or wall thickening visualized. No sonographic Murphy sign noted by sonographer. Common bile duct: Diameter: 2.9 mm Liver: Increased echogenicity. No focal mass. Portal vein is patent on color Doppler imaging with normal direction of blood flow towards the liver. Other: None. IMPRESSION: 1. The gallbladder and common bile duct are normal in appearance. 2. Hepatic steatosis. Electronically Signed   By: Dorise Bullion III M.D   On: 06/04/2020 15:47     CODE STATUS:     Code Status Orders  (From admission, onward)         Start     Ordered   06/04/20 1915  Full code  Continuous        06/04/20 1914        Code Status History    Date Active Date Inactive Code Status Order ID Comments User Context   07/12/2019 1246 07/15/2019 1711 Full Code [id number]  Nicholes Mango, MD ED   05/25/2018 0819 05/27/2018 1553 Full Code [id number]  Arta Silence, MD Inpatient   Advance Care Planning Activity       TOTAL TIME TAKING CARE OF THIS PATIENT: *40* minutes.    Fritzi Mandes M.D  [facility]  CC: Primary care physician; Patient, No Pcp Per

## 2020-06-07 ENCOUNTER — Emergency Department
Admission: EM | Admit: 2020-06-07 | Discharge: 2020-06-07 | Payer: Medicare HMO | Source: Home / Self Care | Attending: Emergency Medicine | Admitting: Emergency Medicine

## 2020-06-07 DIAGNOSIS — Z008 Encounter for other general examination: Secondary | ICD-10-CM

## 2020-06-07 NOTE — ED Notes (Signed)
Dr Owens Shark to subwait to assess pt; pt sitting in w/c with no distress noted; pt denies any c/o; in custody of [place] deputy who st pt needs clearance for jail
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
## 2020-08-31 DIAGNOSIS — F4310 Post-traumatic stress disorder, unspecified: Secondary | ICD-10-CM | POA: Diagnosis not present

## 2020-08-31 DIAGNOSIS — R6889 Other general symptoms and signs: Secondary | ICD-10-CM | POA: Diagnosis not present

## 2020-10-05 DIAGNOSIS — E559 Vitamin D deficiency, unspecified: Secondary | ICD-10-CM | POA: Diagnosis not present

## 2020-10-05 DIAGNOSIS — R5383 Other fatigue: Secondary | ICD-10-CM | POA: Diagnosis not present

## 2020-10-05 DIAGNOSIS — E785 Hyperlipidemia, unspecified: Secondary | ICD-10-CM | POA: Diagnosis not present

## 2020-10-05 DIAGNOSIS — R7301 Impaired fasting glucose: Secondary | ICD-10-CM | POA: Diagnosis not present

## 2020-10-05 DIAGNOSIS — Z1331 Encounter for screening for depression: Secondary | ICD-10-CM | POA: Diagnosis not present

## 2020-10-05 DIAGNOSIS — F411 Generalized anxiety disorder: Secondary | ICD-10-CM | POA: Diagnosis not present

## 2020-10-05 DIAGNOSIS — R339 Retention of urine, unspecified: Secondary | ICD-10-CM | POA: Diagnosis not present

## 2020-10-05 DIAGNOSIS — R519 Headache, unspecified: Secondary | ICD-10-CM | POA: Insufficient documentation

## 2020-10-05 DIAGNOSIS — G4489 Other headache syndrome: Secondary | ICD-10-CM | POA: Insufficient documentation

## 2020-10-05 DIAGNOSIS — F418 Other specified anxiety disorders: Secondary | ICD-10-CM | POA: Insufficient documentation

## 2020-10-05 DIAGNOSIS — I10 Essential (primary) hypertension: Secondary | ICD-10-CM | POA: Diagnosis not present

## 2020-10-05 DIAGNOSIS — K219 Gastro-esophageal reflux disease without esophagitis: Secondary | ICD-10-CM | POA: Insufficient documentation

## 2020-10-05 DIAGNOSIS — R6889 Other general symptoms and signs: Secondary | ICD-10-CM | POA: Diagnosis not present

## 2020-10-05 DIAGNOSIS — E663 Overweight: Secondary | ICD-10-CM | POA: Diagnosis not present

## 2020-10-05 DIAGNOSIS — F32A Depression, unspecified: Secondary | ICD-10-CM | POA: Diagnosis not present

## 2020-10-12 DIAGNOSIS — R6889 Other general symptoms and signs: Secondary | ICD-10-CM | POA: Diagnosis not present

## 2020-11-01 DIAGNOSIS — Z20822 Contact with and (suspected) exposure to covid-19: Secondary | ICD-10-CM | POA: Diagnosis not present

## 2020-11-01 DIAGNOSIS — R071 Chest pain on breathing: Secondary | ICD-10-CM | POA: Diagnosis not present

## 2020-11-01 DIAGNOSIS — R519 Headache, unspecified: Secondary | ICD-10-CM | POA: Diagnosis not present

## 2020-11-01 DIAGNOSIS — M546 Pain in thoracic spine: Secondary | ICD-10-CM | POA: Diagnosis not present

## 2020-11-01 DIAGNOSIS — R682 Dry mouth, unspecified: Secondary | ICD-10-CM | POA: Diagnosis not present

## 2020-11-01 DIAGNOSIS — I255 Ischemic cardiomyopathy: Secondary | ICD-10-CM | POA: Diagnosis not present

## 2020-11-01 DIAGNOSIS — R0781 Pleurodynia: Secondary | ICD-10-CM | POA: Diagnosis not present

## 2020-11-01 DIAGNOSIS — M549 Dorsalgia, unspecified: Secondary | ICD-10-CM | POA: Diagnosis not present

## 2020-11-01 DIAGNOSIS — I10 Essential (primary) hypertension: Secondary | ICD-10-CM | POA: Diagnosis not present

## 2020-11-01 DIAGNOSIS — E7800 Pure hypercholesterolemia, unspecified: Secondary | ICD-10-CM | POA: Diagnosis not present

## 2020-11-01 DIAGNOSIS — M5489 Other dorsalgia: Secondary | ICD-10-CM | POA: Diagnosis not present

## 2020-11-01 DIAGNOSIS — F1410 Cocaine abuse, uncomplicated: Secondary | ICD-10-CM | POA: Diagnosis not present

## 2020-11-01 DIAGNOSIS — R059 Cough, unspecified: Secondary | ICD-10-CM | POA: Diagnosis not present

## 2020-11-14 DIAGNOSIS — R5383 Other fatigue: Secondary | ICD-10-CM | POA: Diagnosis not present

## 2020-11-14 DIAGNOSIS — E785 Hyperlipidemia, unspecified: Secondary | ICD-10-CM | POA: Diagnosis not present

## 2020-11-14 DIAGNOSIS — I10 Essential (primary) hypertension: Secondary | ICD-10-CM | POA: Diagnosis not present

## 2020-11-14 DIAGNOSIS — M545 Low back pain, unspecified: Secondary | ICD-10-CM | POA: Diagnosis not present

## 2020-11-14 DIAGNOSIS — I2510 Atherosclerotic heart disease of native coronary artery without angina pectoris: Secondary | ICD-10-CM | POA: Diagnosis not present

## 2020-11-23 DIAGNOSIS — B349 Viral infection, unspecified: Secondary | ICD-10-CM | POA: Diagnosis not present

## 2020-11-23 DIAGNOSIS — M549 Dorsalgia, unspecified: Secondary | ICD-10-CM | POA: Diagnosis not present

## 2020-11-23 DIAGNOSIS — R0602 Shortness of breath: Secondary | ICD-10-CM | POA: Diagnosis not present

## 2020-11-23 DIAGNOSIS — J449 Chronic obstructive pulmonary disease, unspecified: Secondary | ICD-10-CM | POA: Diagnosis not present

## 2020-11-23 DIAGNOSIS — M545 Low back pain, unspecified: Secondary | ICD-10-CM | POA: Diagnosis not present

## 2020-12-08 ENCOUNTER — Other Ambulatory Visit: Payer: Self-pay

## 2020-12-08 ENCOUNTER — Encounter: Payer: Self-pay | Admitting: Emergency Medicine

## 2020-12-08 ENCOUNTER — Emergency Department: Payer: Medicare HMO

## 2020-12-08 ENCOUNTER — Emergency Department
Admission: EM | Admit: 2020-12-08 | Discharge: 2020-12-08 | Disposition: A | Discharge status: Home / Self Care | Payer: Medicare HMO | Attending: Emergency Medicine | Admitting: Emergency Medicine

## 2020-12-08 DIAGNOSIS — Z955 Presence of coronary angioplasty implant and graft: Secondary | ICD-10-CM | POA: Diagnosis not present

## 2020-12-08 DIAGNOSIS — F17210 Nicotine dependence, cigarettes, uncomplicated: Secondary | ICD-10-CM | POA: Insufficient documentation

## 2020-12-08 DIAGNOSIS — M546 Pain in thoracic spine: Secondary | ICD-10-CM | POA: Diagnosis not present

## 2020-12-08 DIAGNOSIS — Z7982 Long term (current) use of aspirin: Secondary | ICD-10-CM | POA: Diagnosis not present

## 2020-12-08 DIAGNOSIS — I110 Hypertensive heart disease with heart failure: Secondary | ICD-10-CM | POA: Insufficient documentation

## 2020-12-08 DIAGNOSIS — R053 Chronic cough: Secondary | ICD-10-CM | POA: Insufficient documentation

## 2020-12-08 DIAGNOSIS — R000 Tachycardia, unspecified: Secondary | ICD-10-CM | POA: Diagnosis not present

## 2020-12-08 DIAGNOSIS — M545 Low back pain, unspecified: Secondary | ICD-10-CM | POA: Diagnosis not present

## 2020-12-08 DIAGNOSIS — Z79899 Other long term (current) drug therapy: Secondary | ICD-10-CM | POA: Diagnosis not present

## 2020-12-08 DIAGNOSIS — R531 Weakness: Secondary | ICD-10-CM | POA: Diagnosis not present

## 2020-12-08 DIAGNOSIS — I2510 Atherosclerotic heart disease of native coronary artery without angina pectoris: Secondary | ICD-10-CM | POA: Diagnosis not present

## 2020-12-08 DIAGNOSIS — R079 Chest pain, unspecified: Secondary | ICD-10-CM | POA: Diagnosis not present

## 2020-12-08 DIAGNOSIS — I5022 Chronic systolic (congestive) heart failure: Secondary | ICD-10-CM | POA: Diagnosis not present

## 2020-12-08 DIAGNOSIS — M5442 Lumbago with sciatica, left side: Secondary | ICD-10-CM | POA: Diagnosis not present

## 2020-12-08 DIAGNOSIS — R0789 Other chest pain: Secondary | ICD-10-CM | POA: Diagnosis not present

## 2020-12-08 DIAGNOSIS — G8929 Other chronic pain: Secondary | ICD-10-CM | POA: Diagnosis not present

## 2020-12-08 DIAGNOSIS — I10 Essential (primary) hypertension: Secondary | ICD-10-CM | POA: Diagnosis not present

## 2020-12-08 LAB — COMPREHENSIVE METABOLIC PANEL
ALT: 114 U/L — ABNORMAL HIGH (ref 0–44)
AST: 102 U/L — ABNORMAL HIGH (ref 15–41)
Albumin: 3.9 g/dL (ref 3.5–5.0)
Alkaline Phosphatase: 125 U/L (ref 38–126)
Anion gap: 13 (ref 5–15)
BUN: 6 mg/dL (ref 6–20)
CO2: 24 mmol/L (ref 22–32)
Calcium: 8.9 mg/dL (ref 8.9–10.3)
Chloride: 102 mmol/L (ref 98–111)
Creatinine, Ser: 0.66 mg/dL (ref 0.61–1.24)
GFR, Estimated: >60 mL/min (ref >60)
Glucose, Bld: 122 mg/dL — ABNORMAL HIGH (ref 70–99)
Potassium: 3.6 mmol/L (ref 3.5–5.1)
Sodium: 139 mmol/L (ref 135–145)
Total Bilirubin: 0.8 mg/dL (ref 0.3–1.2)
Total Protein: 7.8 g/dL (ref 6.5–8.1)

## 2020-12-08 LAB — CBC WITH DIFFERENTIAL/PLATELET
Abs Immature Granulocytes: 0.11 10*3/uL — ABNORMAL HIGH (ref 0.00–0.07)
Basophils Absolute: 0.1 10*3/uL (ref 0.0–0.1)
Basophils Relative: 1 %
Eosinophils Absolute: 0.1 10*3/uL (ref 0.0–0.5)
Eosinophils Relative: 1 %
HCT: 40 % (ref 39.0–52.0)
Hemoglobin: 14 g/dL (ref 13.0–17.0)
Immature Granulocytes: 1 %
Lymphocytes Relative: 25 %
Lymphs Abs: 2.2 10*3/uL (ref 0.7–4.0)
MCH: 36 pg — ABNORMAL HIGH (ref 26.0–34.0)
MCHC: 35 g/dL (ref 30.0–36.0)
MCV: 102.8 fL — ABNORMAL HIGH (ref 80.0–100.0)
Monocytes Absolute: 0.6 10*3/uL (ref 0.1–1.0)
Monocytes Relative: 7 %
Neutro Abs: 6 10*3/uL (ref 1.7–7.7)
Neutrophils Relative %: 65 %
Platelets: 210 10*3/uL (ref 150–400)
RBC: 3.89 MIL/uL — ABNORMAL LOW (ref 4.22–5.81)
RDW: 13.5 % (ref 11.5–15.5)
WBC: 9.1 10*3/uL (ref 4.0–10.5)
nRBC: 0 % (ref 0.0–0.2)

## 2020-12-08 LAB — LIPASE, BLOOD: Lipase: 32 U/L (ref 11–51)

## 2020-12-08 LAB — TROPONIN I (HIGH SENSITIVITY)
Troponin I (High Sensitivity): 11 ng/L (ref <18)
Troponin I (High Sensitivity): 11 ng/L (ref <18)

## 2020-12-08 LAB — ETHANOL: Alcohol, Ethyl (B): <10 mg/dL (ref <10)

## 2020-12-08 IMAGING — MR MR THORACIC SPINE W/O CM
5 of 6 series · 31 of 48 positions shown · non-contrast
Comparison: CT [DATE]

CLINICAL DATA: Back pain for 4 days

EXAM:
MRI THORACIC AND LUMBAR SPINE WITHOUT CONTRAST
TECHNIQUE: Multiplanar and multiecho pulse sequences of the thoracic and lumbar
spine were obtained without intravenous contrast.

[Series 22: T1 · sagittal · 5.0mm · 1.88mm/px · 4 of 9 slices shown (1 of 2)]
[im 1/9]
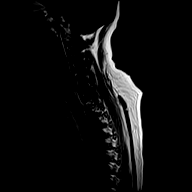
[im 3/9]
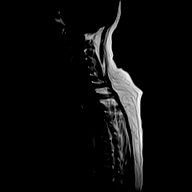
[im 6/9]
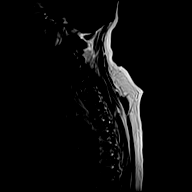
[im 9/9]
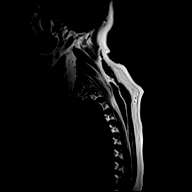

[Series 23: T2 · sagittal · 3.0mm · 1.06mm/px · 6 of 19 slices shown (1 of 2)]
[im 1/19]
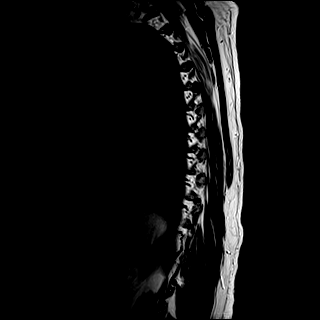
[im 4/19]
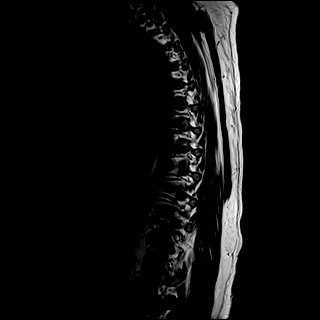
[im 8/19]
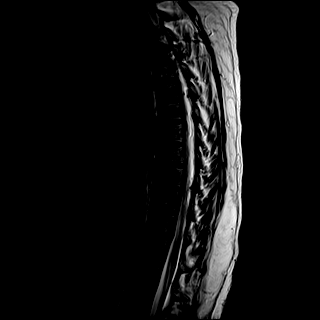
[im 11/19]
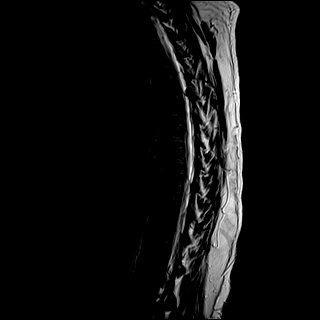
[im 15/19]
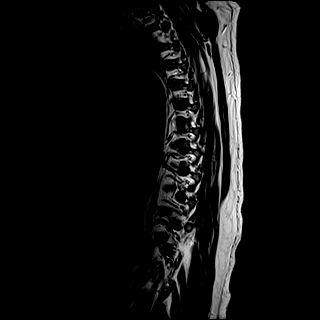
[im 19/19]
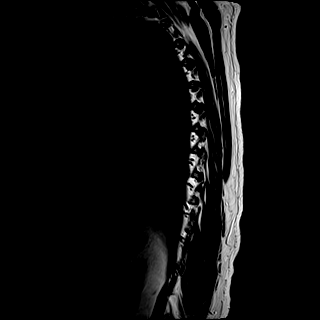

[Series 24: T1 · sagittal · 3.0mm · 1.06mm/px · 6 of 19 slices shown (2 of 2)]
[im 1/19]
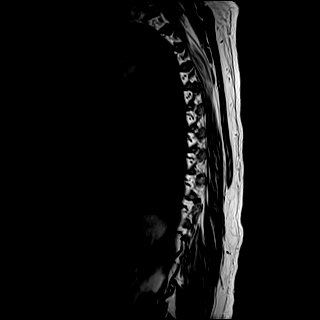
[im 4/19]
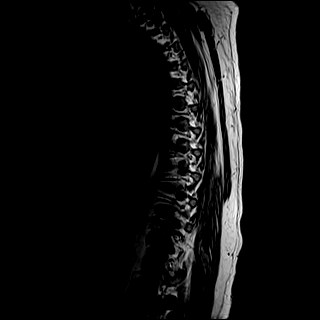
[im 8/19]
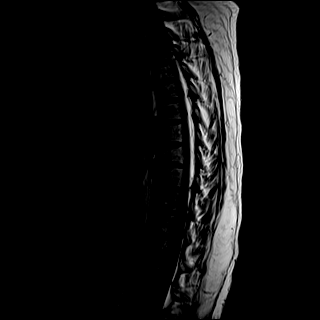
[im 11/19]
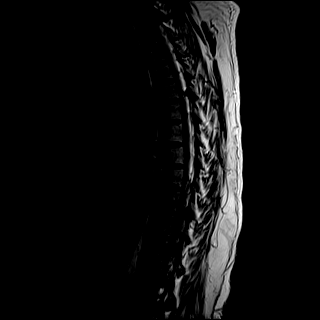
[im 15/19]
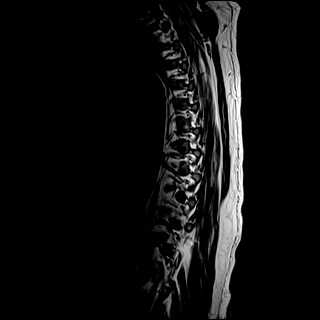
[im 19/19]
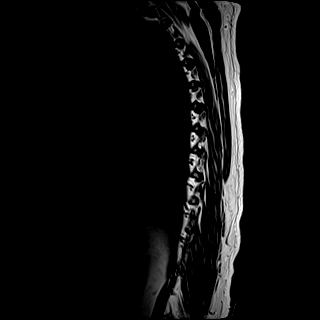

[Series 25: STIR · sagittal · 3.0mm · 0.53mm/px · 6 of 19 slices shown]
[im 1/19]
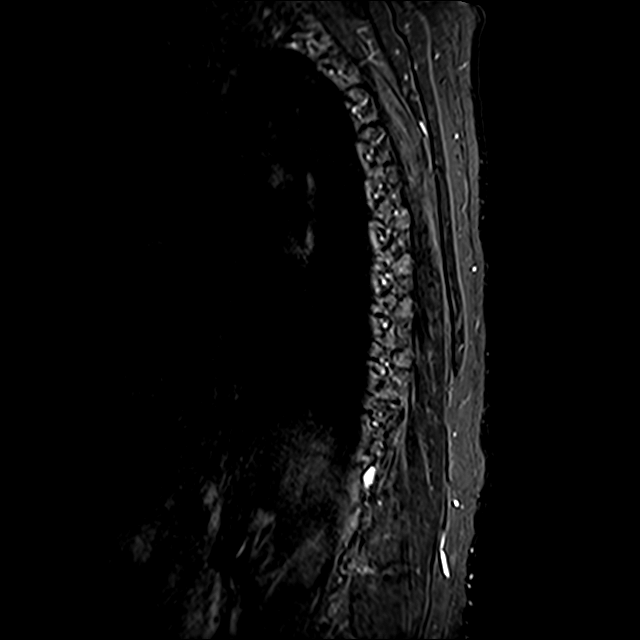
[im 4/19]
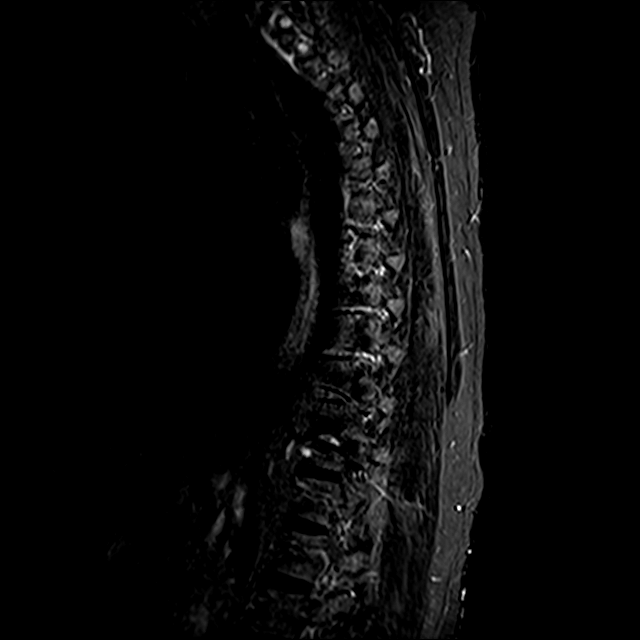
[im 8/19]
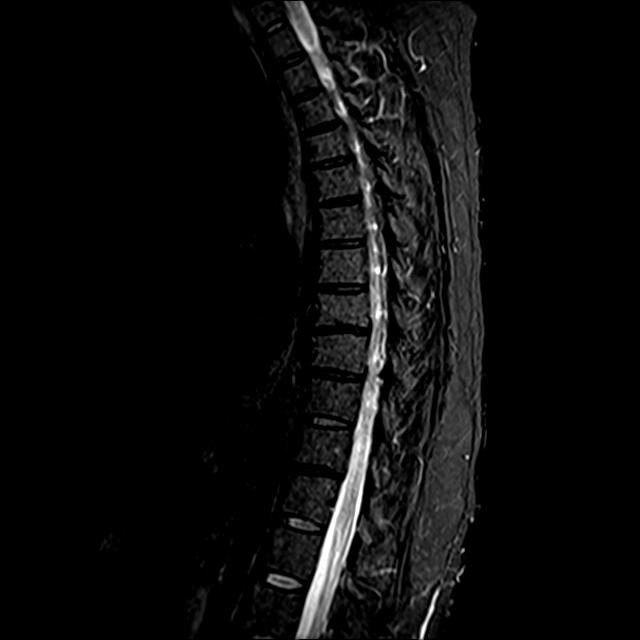
[im 11/19]
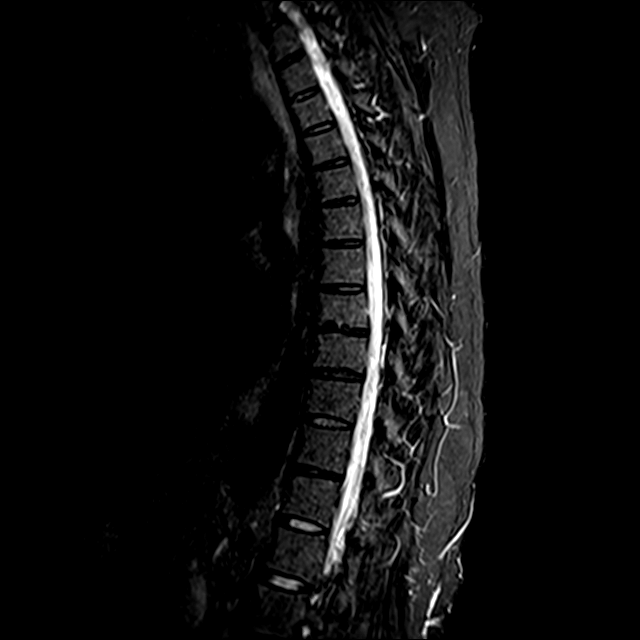
[im 15/19]
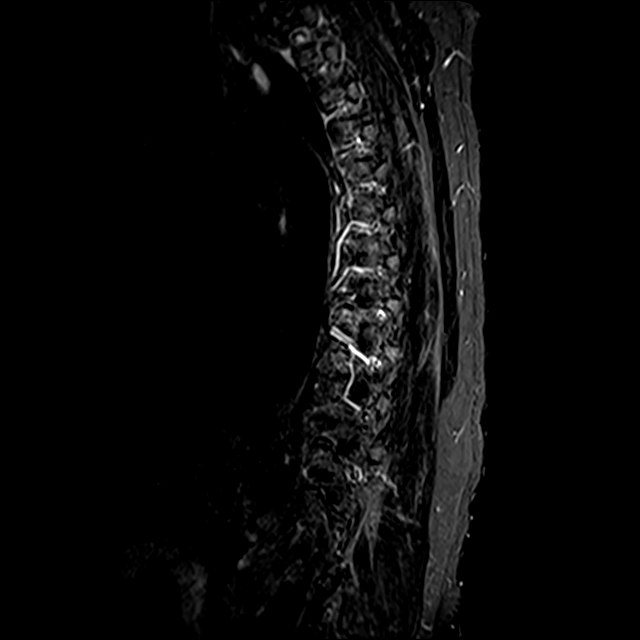
[im 19/19]
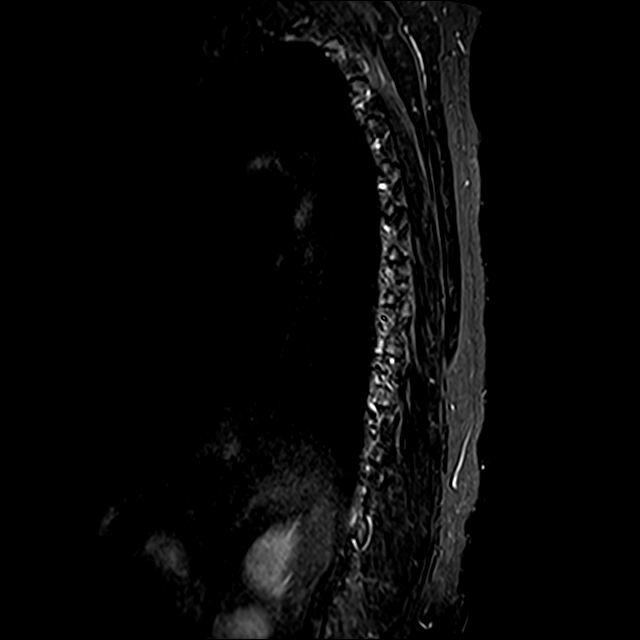

[Series 26: T2 · axial · 4.0mm · 0.59mm/px · z∈[-197,+19]mm · 9 of 39 slices shown (2 of 2)]
[im 1/39]
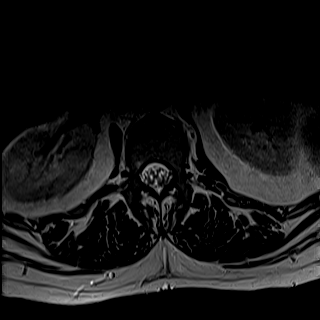
[im 7/39]
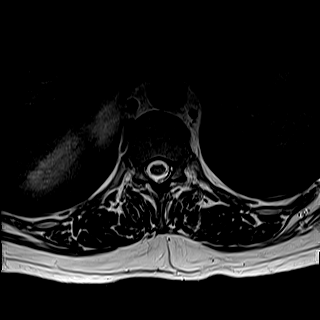
[im 13/39]
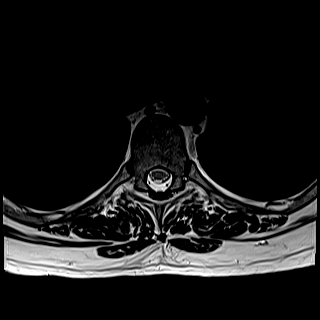
[im 16/39]
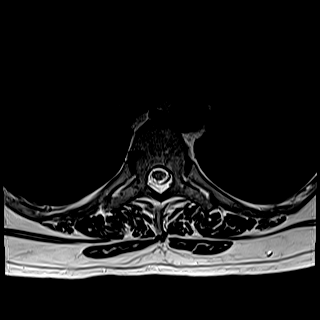
[im 20/39]
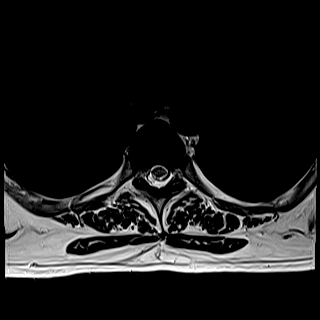
[im 23/39]
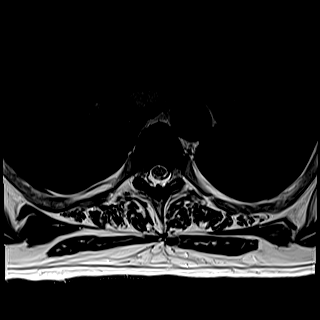
[im 26/39]
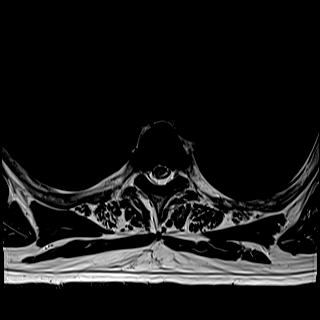
[im 32/39]
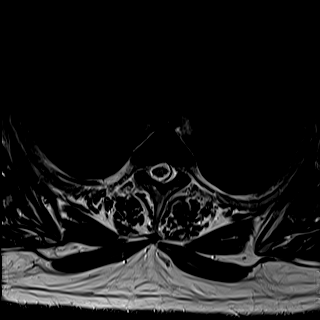
[im 39/39]
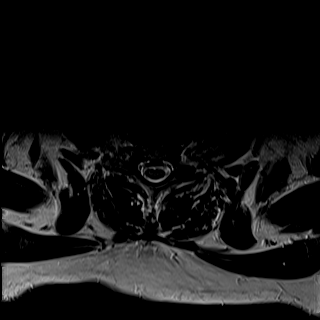

[31 of 48 positions shown; findings below may reference images not displayed]

FINDINGS: MRI THORACIC SPINE FINDINGS

Alignment:  Physiologic.

Vertebrae: No fracture, evidence of discitis, or bone lesion.

Cord:  Normal signal and morphology.

Paraspinal and other soft tissues: Negative.

Disc levels:

Disc desiccation and mild disc height loss at T8-9, T9-10, and
T11-12. Mild bilateral facet hypertrophy is most prevalent at T9-10
and T10-11. There is no focal disc protrusion. No canal or foraminal
stenosis at any level. Mild posterior epidural fat deposition
throughout the thoracic spine.

MRI LUMBAR SPINE FINDINGS

Segmentation:  Standard.

Alignment:  Physiologic.

Vertebrae:  No fracture, evidence of discitis, or bone lesion.

Conus medullaris and cauda equina: Conus extends to the L1 level.
Conus and cauda equina appear normal.

Paraspinal and other soft tissues: Negative.

Disc levels:

T12-L1: Unremarkable.

L1-L2: Unremarkable.

L2-L3: Minimal circumferential disc bulge. No foraminal or canal
stenosis.

L3-L4: Minimal circumferential disc bulge. No foraminal or canal
stenosis.

L4-L5: Small central disc protrusion with posterior annular fissure.
Slight cranial extension of disc material. No significant foraminal
stenosis. No canal stenosis.

L5-S1: Shallow disc protrusion. Epidural lipomatosis. No foraminal
or canal stenosis.
IMPRESSION: 1. No acute osseous abnormality of the thoracic or lumbar spine.
2. Mild thoracolumbar degenerative disc disease, as described above.
No canal or neural foraminal stenosis at any level.

## 2020-12-08 IMAGING — MR MR LUMBAR SPINE W/O CM
5 series · 31 of 48 positions shown · non-contrast
Comparison: CT [DATE]

CLINICAL DATA: Back pain for 4 days

EXAM:
MRI THORACIC AND LUMBAR SPINE WITHOUT CONTRAST
TECHNIQUE: Multiplanar and multiecho pulse sequences of the thoracic and lumbar
spine were obtained without intravenous contrast.

[Series 10: T2 · sagittal · 4.0mm · 0.81mm/px · 6 of 17 slices shown (1 of 2)]
[im 1/17]
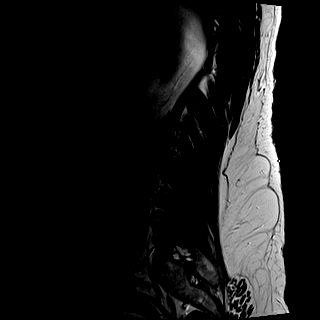
[im 4/17]
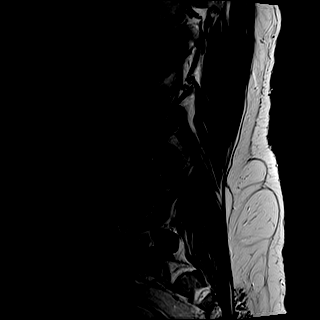
[im 7/17]
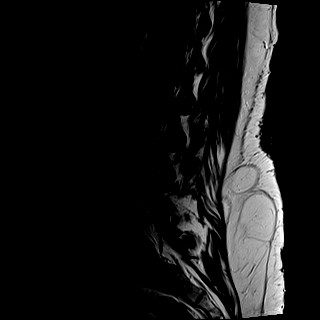
[im 10/17]
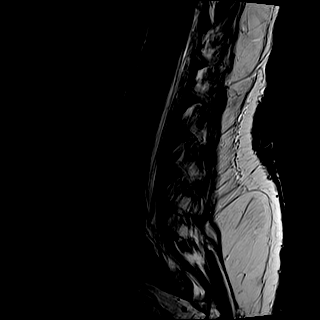
[im 13/17]
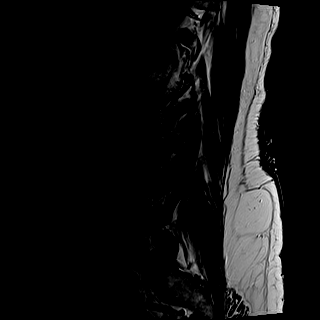
[im 17/17]
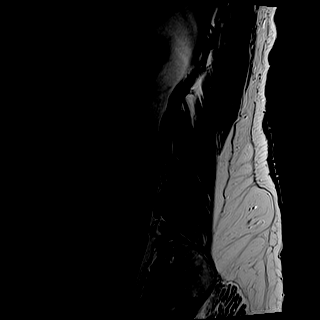

[Series 11: T1 · sagittal · 4.0mm · 0.81mm/px · 7 of 17 slices shown (1 of 2)]
[im 1/17]
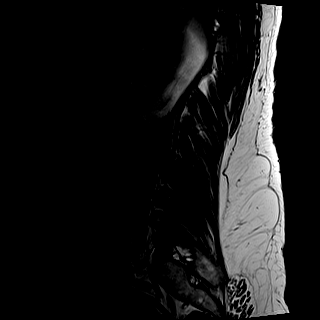
[im 3/17]
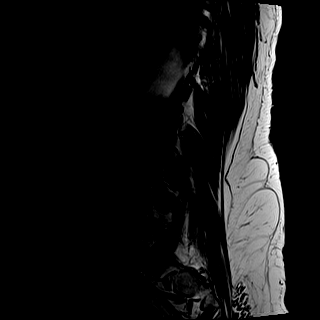
[im 6/17]
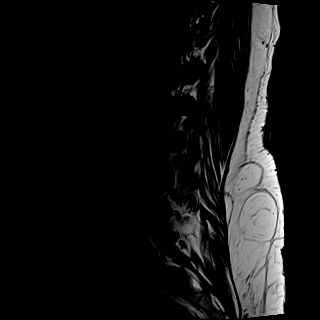
[im 9/17]
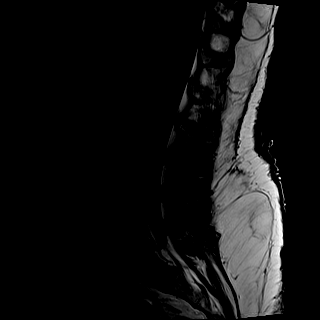
[im 11/17]
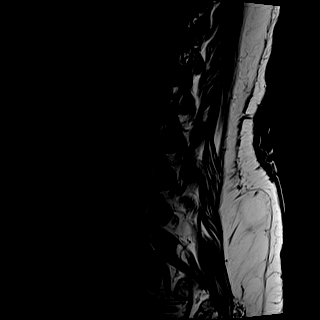
[im 14/17]
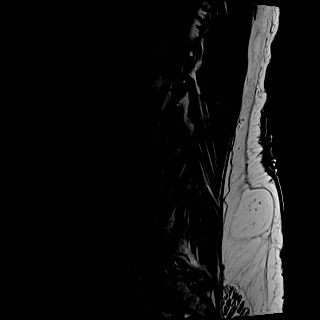
[im 17/17]
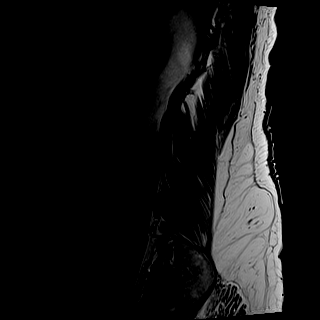

[Series 12: STIR · sagittal · 4.0mm · 0.41mm/px · 2 of 17 slices shown]
[im 1/17]
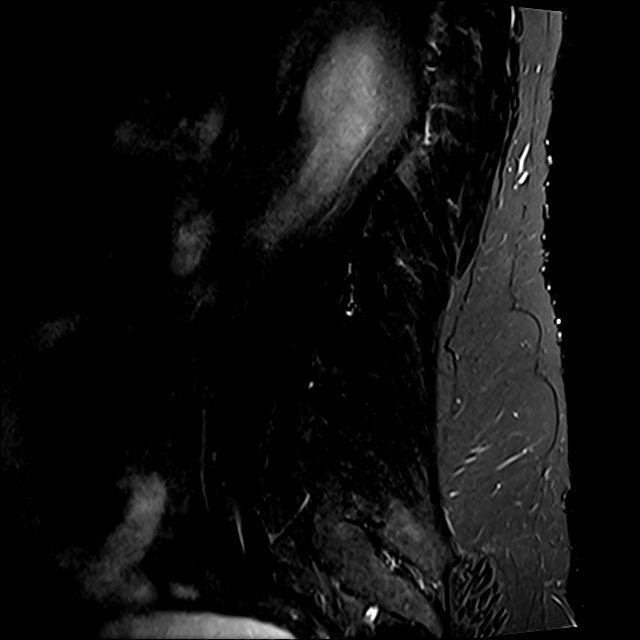
[im 3/17]
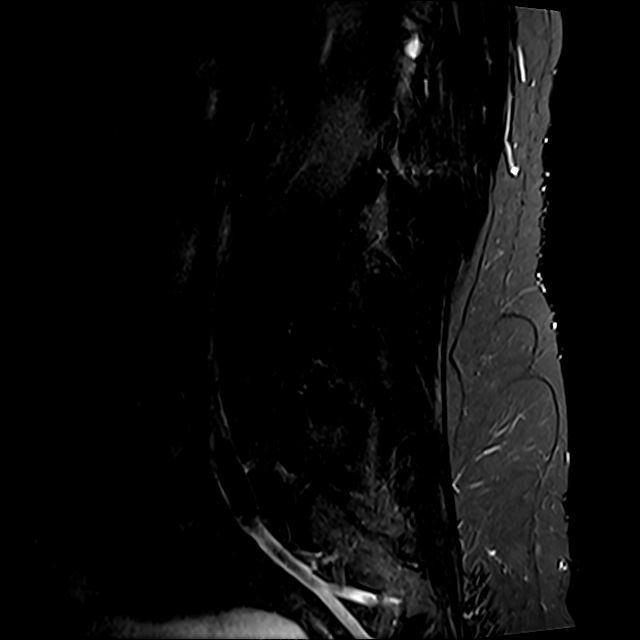

[Series 13: T2 · axial · 4.0mm · 0.78mm/px · z∈[-405,-195]mm · 8 of 37 slices shown (2 of 2)]
[im 1/37]
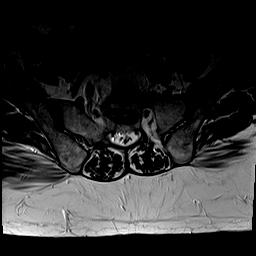
[im 6/37]
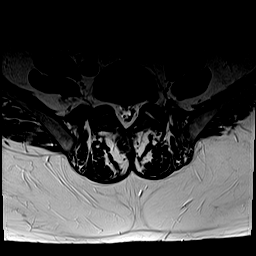
[im 12/37]
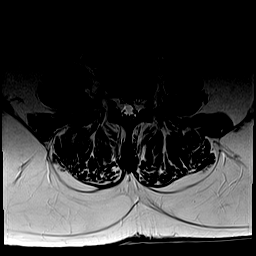
[im 17/37]
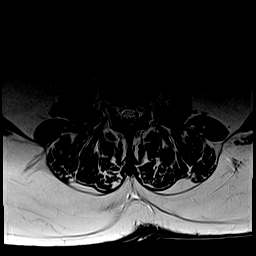
[im 20/37]
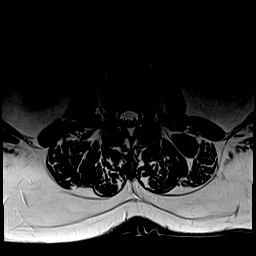
[im 25/37]
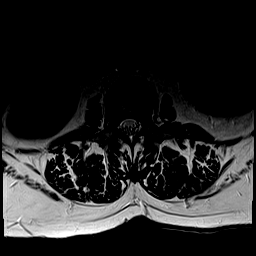
[im 31/37]
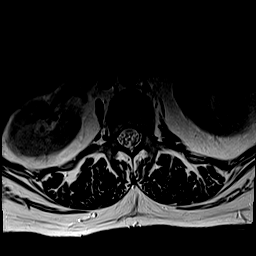
[im 37/37]
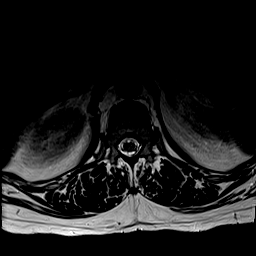

[Series 14: T1 · axial · 4.0mm · 0.39mm/px · z∈[-405,-195]mm · 8 of 37 slices shown (2 of 2)]
[im 1/37]
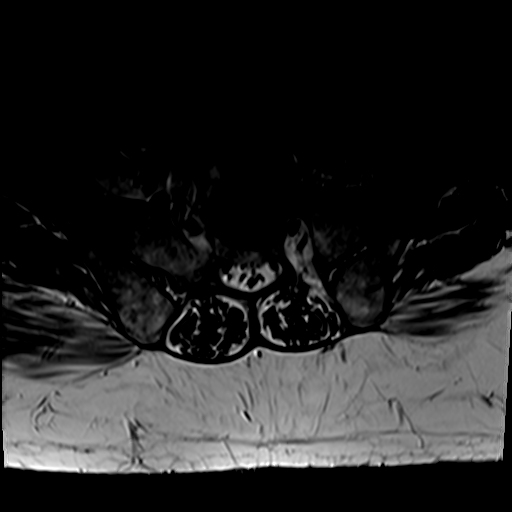
[im 6/37]
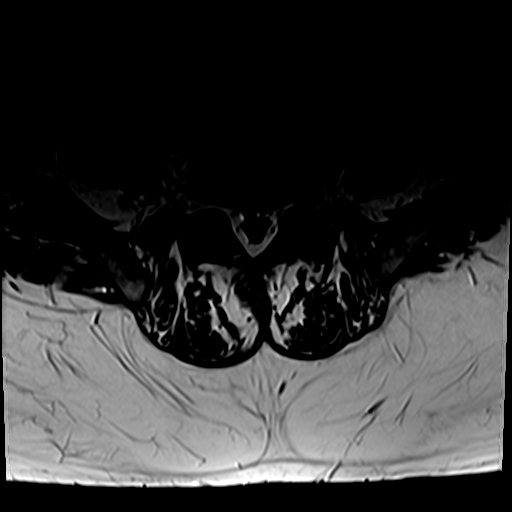
[im 12/37]
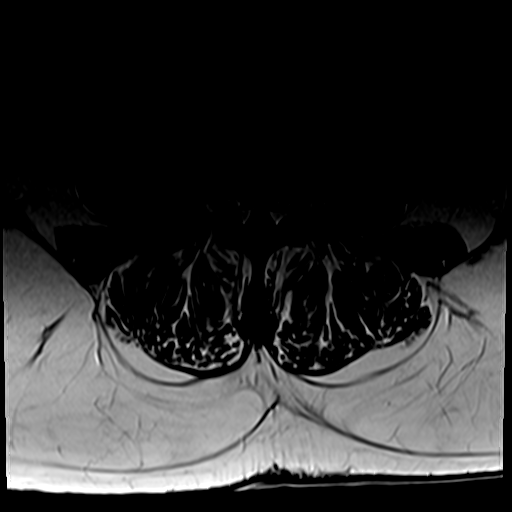
[im 17/37]
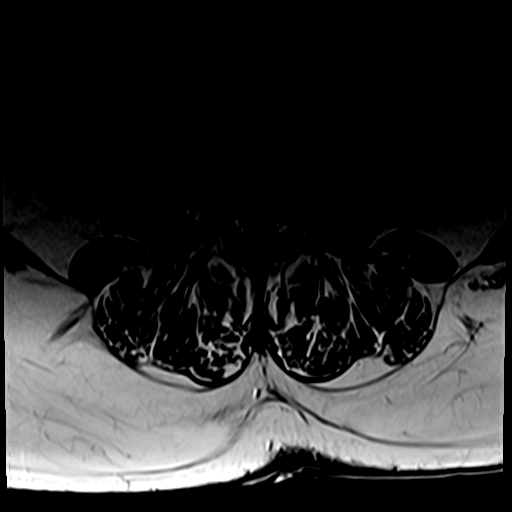
[im 20/37]
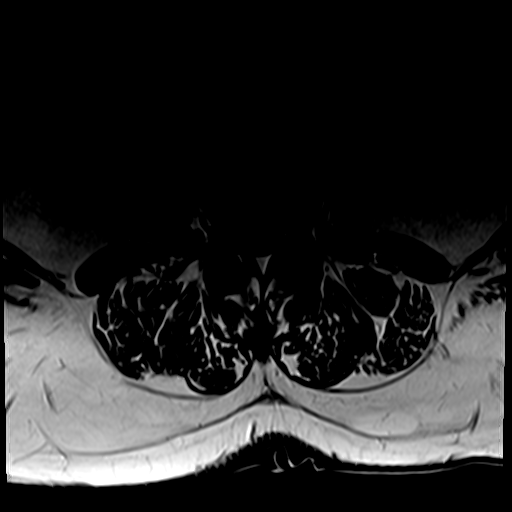
[im 25/37]
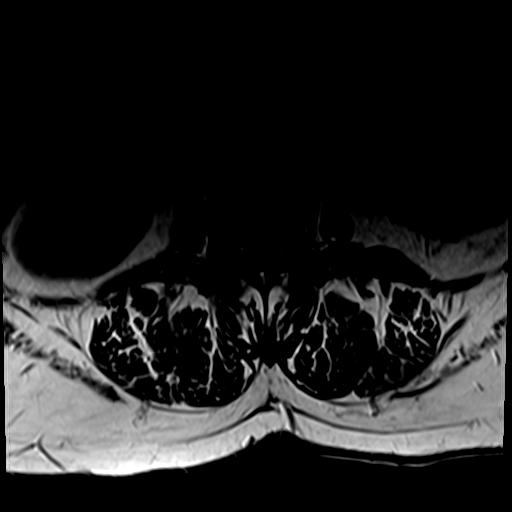
[im 31/37]
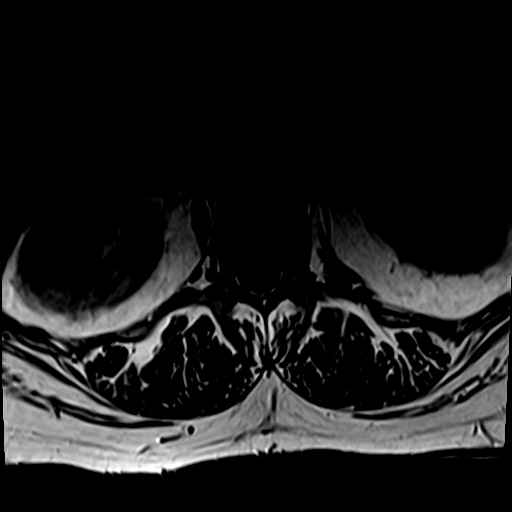
[im 37/37]
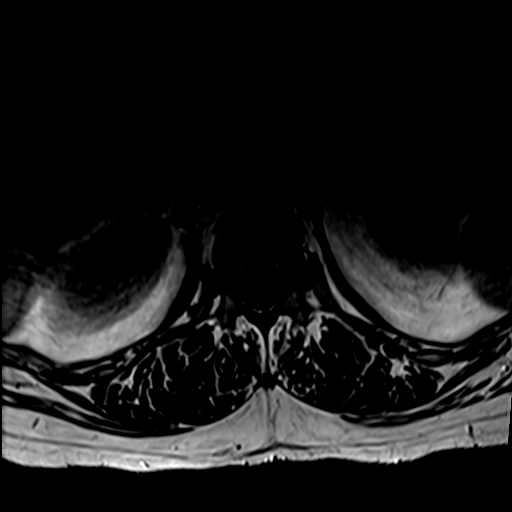

[31 of 48 positions shown; findings below may reference images not displayed]

FINDINGS: MRI THORACIC SPINE FINDINGS

Alignment:  Physiologic.

Vertebrae: No fracture, evidence of discitis, or bone lesion.

Cord:  Normal signal and morphology.

Paraspinal and other soft tissues: Negative.

Disc levels:

Disc desiccation and mild disc height loss at T8-9, T9-10, and
T11-12. Mild bilateral facet hypertrophy is most prevalent at T9-10
and T10-11. There is no focal disc protrusion. No canal or foraminal
stenosis at any level. Mild posterior epidural fat deposition
throughout the thoracic spine.

MRI LUMBAR SPINE FINDINGS

Segmentation:  Standard.

Alignment:  Physiologic.

Vertebrae:  No fracture, evidence of discitis, or bone lesion.

Conus medullaris and cauda equina: Conus extends to the L1 level.
Conus and cauda equina appear normal.

Paraspinal and other soft tissues: Negative.

Disc levels:

T12-L1: Unremarkable.

L1-L2: Unremarkable.

L2-L3: Minimal circumferential disc bulge. No foraminal or canal
stenosis.

L3-L4: Minimal circumferential disc bulge. No foraminal or canal
stenosis.

L4-L5: Small central disc protrusion with posterior annular fissure.
Slight cranial extension of disc material. No significant foraminal
stenosis. No canal stenosis.

L5-S1: Shallow disc protrusion. Epidural lipomatosis. No foraminal
or canal stenosis.
IMPRESSION: 1. No acute osseous abnormality of the thoracic or lumbar spine.
2. Mild thoracolumbar degenerative disc disease, as described above.
No canal or neural foraminal stenosis at any level.

## 2020-12-08 MED ORDER — LISINOPRIL 5 MG PO TABS: 5.0000 mg | ORAL_TABLET | Freq: Once | ORAL | Status: DC

## 2020-12-08 MED ORDER — LIDOCAINE 5 % EX PTCH
1.0000 | MEDICATED_PATCH | CUTANEOUS | Status: DC
  Administered 2020-12-08: 1 via TRANSDERMAL
  Filled 2020-12-08: qty 1

## 2020-12-08 MED ORDER — ONDANSETRON HCL 4 MG/2ML IJ SOLN
4.0000 mg | Freq: Once | INTRAMUSCULAR | Status: AC
  Administered 2020-12-08: 4 mg via INTRAVENOUS
  Filled 2020-12-08: qty 2

## 2020-12-08 MED ORDER — MORPHINE SULFATE (PF) 2 MG/ML IV SOLN
2.0000 mg | Freq: Once | INTRAVENOUS | Status: AC
  Administered 2020-12-08: 2 mg via INTRAVENOUS
  Filled 2020-12-08: qty 1

## 2020-12-08 MED ORDER — MORPHINE SULFATE (PF) 4 MG/ML IV SOLN
4.0000 mg | Freq: Once | INTRAVENOUS | Status: AC
  Administered 2020-12-08: 4 mg via INTRAVENOUS
  Filled 2020-12-08: qty 1

## 2020-12-08 NOTE — ED Triage Notes (Signed)
  Patient comes in by EMS for back pain and emesis.  Patient states he has had back pain the last 4 days and was not getting any better.  Patient has attempted to self medicate at home with alcohol, and goodys powders.  Patient has hypertension and is on 4 medications that he does not take regularly.  Patient has had several "bootlegger" alcoholic beverages and has had several dry heaving episodes since EMS arrival.  Pain 10/10 in back.

## 2020-12-08 NOTE — ED Notes (Signed)
E-signature not working at this time. Pt verbalized understanding of D/C instructions, prescriptions and follow up care with no further questions at this time. Pt in NAD and wheeled to lobby at time of D/C.  

## 2020-12-08 NOTE — Discharge Instructions (Addendum)
No driving or operating dangerous equipment today.

## 2020-12-08 NOTE — ED Provider Notes (Signed)
[facility] Emergency Department Provider Note   ____________________________________________   Event Date/Time   First MD Initiated Contact with Patient 12/08/20 [phone]     (approximate)  I have reviewed the triage vital signs and the nursing notes.   HISTORY  Chief Complaint Back Pain and Alcohol Intoxication    HPI Ethan Wells is a 49 y.o. male for evaluation of several days of left mid to lower back pain  Patient reports that he has been having over the last 5 days pretty severe pain over his left lower to middle back.  Is worsened by movement.  He mostly reports he has been sitting around in a chair.  Reports he is very inactive most days, he does drink alcohol regularly.  Usually drinks Bootleg brand alcohol, and does not use any alcohol that is not store-bought.   Reports the pain is severe left lower back.  There is no numbness or weakness with it but it hurts whenever he moves his left leg.  Does radiate towards his left side  No chest pain or trouble breathing.  Does have chronic cough unchanged reports from COPD.  No wheezing.  Patient does tell me he was seen at [facility] and also at urgent care for the same issues over the last month.  He was advised to follow-up with his primary doctor  He does report he has prescriptions ready for him to pick up to pharmacy for his blood pressure and his regular medications, does not wish to take his blood pressure medication here as he reports that he has that available to him just has to go get  No fevers or chills.  No IV drug use.  No rashes.  No cold or blue feet no pain in his neck.  No chest pain  Reports that he was given a brief prescription for hydrocodone and that helped quite a lot  Past Medical History:  Diagnosis Date   CAD (coronary artery disease)    a. 01/2011 Anterior STEMI/Cath/PCI: LM nl, LAD 100d (3.5x19mm Vision BMS placed), LCX 5m, RI 50, RCA min irregs, EF 40% w/ apical,  inferoapical HK.   Cardiac arrest - ventricular fibrillation    a. In setting of STEMI 01/2011   Cocaine abuse ([place])    ETOH abuse    a. 6-12 beers/day   Hemorrhoids    Hypertension    Ischemic cardiomyopathy    a. 06/2011 Echo: EF 45-50%, No rwma   Marijuana abuse    Tobacco abuse    a. 1/2 ppd x 26 yrs    Patient Active Problem List   Diagnosis Date Noted   Hyponatremia 06/04/2020   Hypomagnesemia [id number]   Chronic systolic CHF (congestive heart failure) ([place]) 06/04/2020   Leukocytosis 06/04/2020   Abdominal pain 06/04/2020   Hypokalemia 06/04/2020   Ischemic cardiomyopathy    Polysubstance abuse ([place])    Chest pain 07/12/2019   Abdominal pain, RUQ 05/25/2018   Chest pain, mid sternal 11/17/2012   Cocaine abuse ([place])    Marijuana abuse    TOBACCO ABUSE 02/26/2011   HYPERTENSION, BENIGN 02/26/2011   CAD, NATIVE VESSEL 02/26/2011   Other specified forms of chronic ischemic heart disease 02/26/2011    Past Surgical History:  Procedure Laterality Date   CORONARY ANGIOPLASTY WITH STENT PLACEMENT      Prior to Admission medications   Medication Sig Start Date End Date Taking? Authorizing Provider  aspirin 81 MG chewable tablet Chew 1 tablet (81 mg  total) by mouth daily. 06/07/20   Fritzi Mandes, MD  atorvastatin (LIPITOR) 40 MG tablet Take 1 tablet (40 mg total) by mouth daily. 06/06/20   Fritzi Mandes, MD  folic acid (FOLVITE) 1 MG tablet Take 1 tablet (1 mg total) by mouth daily. 06/07/20   Fritzi Mandes, MD  lisinopril (ZESTRIL) 5 MG tablet Take 1 tablet (5 mg total) by mouth daily. 06/07/20   Fritzi Mandes, MD  Multiple Vitamin (MULTIVITAMIN WITH MINERALS) TABS tablet Take 1 tablet by mouth daily. 06/07/20   Fritzi Mandes, MD  nitroGLYCERIN (NITROSTAT) 0.4 MG SL tablet Place 1 tablet (0.4 mg total) under the tongue every 5 (five) minutes as needed for chest pain. 06/06/20   Fritzi Mandes, MD  pantoprazole (PROTONIX) 40 MG tablet Take 1 tablet (40 mg total) by  mouth daily at 12 noon. 06/06/20   Fritzi Mandes, MD  thiamine 100 MG tablet Take 1 tablet (100 mg total) by mouth daily. 06/07/20   Fritzi Mandes, MD    Allergies Penicillins  Family History  Problem Relation Age of Onset   Coronary artery disease Mother        alive   Other Other        2 sisters alive and well   Alcohol abuse Father        died @ 33    Social History Social History   Tobacco Use   Smoking status: Current Every Day Smoker    Packs/day: 0.50    Years: 26.00    Pack years: 13.00    Types: Cigarettes   Smokeless tobacco: Never Used  Scientific laboratory technician Use: Never used  Substance Use Topics   Alcohol use: Yes    Comment: at least a 6 pack of beer daily and often a 12 pack   Drug use: Yes    Types: Cocaine, Marijuana    Comment:  cocaine used several weeks ago; marijuana today    Review of Systems Constitutional: No fever/chills Eyes: No visual changes. ENT: No sore throat. Cardiovascular: Denies chest pain. Respiratory: Denies shortness of breath. Gastrointestinal: No abdominal pain.   Genitourinary: Negative for dysuria. Musculoskeletal: Pain in back see HPI Skin: Negative for rash. Neurological: Negative for headaches, areas of focal weakness or numbness.    ____________________________________________   PHYSICAL EXAM:  VITAL SIGNS: ED Triage Vitals [12/08/20 0118]  Enc Vitals Group     BP (!) 168/105     Pulse Rate (!) 113     Resp 20     Temp 98.6 F (37 C)     Temp Source Oral     SpO2 100 %     Weight 200 lb (90.7 kg)     Height 5\' 5"  (1.651 m)     Head Circumference      Peak Flow      Pain Score 10     Pain Loc      Pain Edu?      Excl. in [place]?     Constitutional: Alert and oriented. Well appearing and in no acute distress.  Appears somewhat disheveled in appearance but in no acute distress. Eyes: Conjunctivae are normal. Head: Atraumatic. Nose: No congestion/rhinnorhea. Mouth/Throat: Mucous membranes are  moist. Neck: No stridor.  Cardiovascular: Normal rate, regular rhythm. Grossly normal heart sounds.  Good peripheral circulation. Respiratory: Normal respiratory effort.  No retractions. Lungs CTAB. Gastrointestinal: Soft and nontender. No distention. Musculoskeletal: No lower extremity tenderness nor edema.  Positive discomfort to straight leg raise  on the left.  Strong dorsalis pedis and posterior tibial pulses bilateral.  Normal sensation across the feet bilaterally.  He demonstrates normal strength but some limitation due to pain he reports in his left lower back when he raises his left leg.  No lesions.  Some tenderness along the left paraspinous and lumbar thoracic regions.  No upper thoracic pain.  Denies trauma or injuries Neurologic:  Normal speech and language. No gross focal neurologic deficits are appreciated.  No tremulousness Skin:  Skin is warm, dry and intact. No rash noted.  No sweats. Psychiatric: Mood and affect are normal. Speech and behavior are normal.  ____________________________________________   LABS (all labs ordered are listed, but only abnormal results are displayed)  Labs Reviewed  CBC WITH DIFFERENTIAL/PLATELET - Abnormal; Notable for the following components:      Result Value   RBC 3.89 (*)    MCV 102.8 (*)    MCH 36.0 (*)    Abs Immature Granulocytes 0.11 (*)    All other components within normal limits  COMPREHENSIVE METABOLIC PANEL - Abnormal; Notable for the following components:   Glucose, Bld 122 (*)    AST 102 (*)    ALT 114 (*)    All other components within normal limits  ETHANOL  LIPASE, BLOOD  URINE DRUG SCREEN, QUALITATIVE ([facility] ONLY)  TROPONIN I (HIGH SENSITIVITY)  TROPONIN I (HIGH SENSITIVITY)   ____________________________________________  EKG  Reviewed interpreted by me at 1:30 AM Heart rate 95 PR 130 QRS 80 QTc 440 Normal sinus rhythm, mild nonspecific T wave abnormality.  No evidence of acute ischemia denoted t waves  similar in appearance to 06-04-20 ____________________________________________  RADIOLOGY  MR THORACIC SPINE WO CONTRAST  Result Date: 12/08/2020 CLINICAL DATA:  Back pain for 4 days EXAM: MRI THORACIC AND LUMBAR SPINE WITHOUT CONTRAST TECHNIQUE: Multiplanar and multiecho pulse sequences of the thoracic and lumbar spine were obtained without intravenous contrast. COMPARISON:  CT 06/04/2020 FINDINGS: MRI THORACIC SPINE FINDINGS Alignment:  Physiologic. Vertebrae: No fracture, evidence of discitis, or bone lesion. Cord:  Normal signal and morphology. Paraspinal and other soft tissues: Negative. Disc levels: Disc desiccation and mild disc height loss at T8-9, T9-10, and T11-12. Mild bilateral facet hypertrophy is most prevalent at T9-10 and T10-11. There is no focal disc protrusion. No canal or foraminal stenosis at any level. Mild posterior epidural fat deposition throughout the thoracic spine. MRI LUMBAR SPINE FINDINGS Segmentation:  Standard. Alignment:  Physiologic. Vertebrae:  No fracture, evidence of discitis, or bone lesion. Conus medullaris and cauda equina: Conus extends to the L1 level. Conus and cauda equina appear normal. Paraspinal and other soft tissues: Negative. Disc levels: T12-L1: Unremarkable. L1-L2: Unremarkable. L2-L3: Minimal circumferential disc bulge. No foraminal or canal stenosis. L3-L4: Minimal circumferential disc bulge. No foraminal or canal stenosis. L4-L5: Small central disc protrusion with posterior annular fissure. Slight cranial extension of disc material. No significant foraminal stenosis. No canal stenosis. L5-S1: Shallow disc protrusion. Epidural lipomatosis. No foraminal or canal stenosis. IMPRESSION: 1. No acute osseous abnormality of the thoracic or lumbar spine. 2. Mild thoracolumbar degenerative disc disease, as described above. No canal or neural foraminal stenosis at any level. Electronically Signed   By: Davina Poke D.O.   On: 12/08/2020 10:57   MR LUMBAR SPINE  WO CONTRAST  Result Date: 12/08/2020 CLINICAL DATA:  Back pain for 4 days EXAM: MRI THORACIC AND LUMBAR SPINE WITHOUT CONTRAST TECHNIQUE: Multiplanar and multiecho pulse sequences of the thoracic and lumbar spine were obtained without  intravenous contrast. COMPARISON:  CT 06/04/2020 FINDINGS: MRI THORACIC SPINE FINDINGS Alignment:  Physiologic. Vertebrae: No fracture, evidence of discitis, or bone lesion. Cord:  Normal signal and morphology. Paraspinal and other soft tissues: Negative. Disc levels: Disc desiccation and mild disc height loss at T8-9, T9-10, and T11-12. Mild bilateral facet hypertrophy is most prevalent at T9-10 and T10-11. There is no focal disc protrusion. No canal or foraminal stenosis at any level. Mild posterior epidural fat deposition throughout the thoracic spine. MRI LUMBAR SPINE FINDINGS Segmentation:  Standard. Alignment:  Physiologic. Vertebrae:  No fracture, evidence of discitis, or bone lesion. Conus medullaris and cauda equina: Conus extends to the L1 level. Conus and cauda equina appear normal. Paraspinal and other soft tissues: Negative. Disc levels: T12-L1: Unremarkable. L1-L2: Unremarkable. L2-L3: Minimal circumferential disc bulge. No foraminal or canal stenosis. L3-L4: Minimal circumferential disc bulge. No foraminal or canal stenosis. L4-L5: Small central disc protrusion with posterior annular fissure. Slight cranial extension of disc material. No significant foraminal stenosis. No canal stenosis. L5-S1: Shallow disc protrusion. Epidural lipomatosis. No foraminal or canal stenosis. IMPRESSION: 1. No acute osseous abnormality of the thoracic or lumbar spine. 2. Mild thoracolumbar degenerative disc disease, as described above. No canal or neural foraminal stenosis at any level. Electronically Signed   By: Davina Poke D.O.   On: 12/08/2020 10:57     MRI imaging reviewed, no acute major findings are noted.  There are degenerative disc disease findings as  noted. ____________________________________________   PROCEDURES  Procedure(s) performed: None  Procedures  Critical Care performed: No  ____________________________________________   INITIAL IMPRESSION / ASSESSMENT AND PLAN / ED COURSE  Pertinent labs & imaging results that were available during my care of the patient were reviewed by me and considered in my medical decision making (see chart for details).   Patient report persisting back pain now for over a month.  Continues to endorse tenderness and pain along the left paraspinous region with radiation towards his left leg.  I suspect most likely musculoskeletal in nature, he had previous imaging including plain films of the region and evaluated once at outside ER and also at urgent care.  Given the ongoing nature of his symptoms I discussed them I suspect this is musculoskeletal.  I do not have significant concern for aneurysm or dissection or acute intra-abdominal process given his reassuring exam.  He also has had previous CT angiogram a few months ago that did not demonstrate acute aortic abnormality  Discussed with the patient, given his ongoing discomfort will give morphine for symptomatic relief here in the ER and obtain MRI imaging to further evaluate for cause.  I did discuss with him though that I would not anticipate discharge with a prescription pain medication today and did recommend he follow-up with his primary care or pain medicine clinic for this.  [facility] prescriber database reviewed  ----------------------------------------- 11:48 AM on 12/08/2020 -----------------------------------------  Patient awake, fully alert.  Not driving himself home.  Recommendation to follow-up with his primary care physician as well as pain medicine for further work-up and evaluation.  Return precautions and treatment recommendations and follow-up discussed with the patient who is agreeable with the plan.  Vitals:   12/08/20 0915 12/08/20  0930  BP: (!) 162/82 (!) 162/88  Pulse: 90 84  Resp: 17   Temp:    SpO2: 98% 97%         ____________________________________________   FINAL CLINICAL IMPRESSION(S) / ED DIAGNOSES  Final diagnoses:  Chronic left-sided low back  pain with left-sided sciatica        Note:  This document was prepared using Dragon voice recognition software and may include unintentional dictation errors       Delman Kitten, MD 12/08/20 1149

## 2020-12-09 ENCOUNTER — Emergency Department: Payer: Medicare HMO

## 2020-12-09 ENCOUNTER — Emergency Department
Admission: EM | Admit: 2020-12-09 | Discharge: 2020-12-09 | Disposition: A | Discharge status: Home / Self Care | Payer: Medicare HMO | Attending: Emergency Medicine | Admitting: Emergency Medicine

## 2020-12-09 ENCOUNTER — Other Ambulatory Visit: Payer: Self-pay

## 2020-12-09 DIAGNOSIS — Z20822 Contact with and (suspected) exposure to covid-19: Secondary | ICD-10-CM | POA: Insufficient documentation

## 2020-12-09 DIAGNOSIS — I110 Hypertensive heart disease with heart failure: Secondary | ICD-10-CM | POA: Diagnosis not present

## 2020-12-09 DIAGNOSIS — I5022 Chronic systolic (congestive) heart failure: Secondary | ICD-10-CM | POA: Insufficient documentation

## 2020-12-09 DIAGNOSIS — R0602 Shortness of breath: Secondary | ICD-10-CM | POA: Diagnosis not present

## 2020-12-09 DIAGNOSIS — Z79899 Other long term (current) drug therapy: Secondary | ICD-10-CM | POA: Diagnosis not present

## 2020-12-09 DIAGNOSIS — G8929 Other chronic pain: Secondary | ICD-10-CM | POA: Diagnosis not present

## 2020-12-09 DIAGNOSIS — J4521 Mild intermittent asthma with (acute) exacerbation: Secondary | ICD-10-CM | POA: Insufficient documentation

## 2020-12-09 DIAGNOSIS — Z7982 Long term (current) use of aspirin: Secondary | ICD-10-CM | POA: Insufficient documentation

## 2020-12-09 DIAGNOSIS — I2510 Atherosclerotic heart disease of native coronary artery without angina pectoris: Secondary | ICD-10-CM | POA: Diagnosis not present

## 2020-12-09 DIAGNOSIS — F17210 Nicotine dependence, cigarettes, uncomplicated: Secondary | ICD-10-CM | POA: Insufficient documentation

## 2020-12-09 DIAGNOSIS — Z955 Presence of coronary angioplasty implant and graft: Secondary | ICD-10-CM | POA: Diagnosis not present

## 2020-12-09 LAB — RESP PANEL BY RT-PCR (FLU A&B, COVID) ARPGX2
Influenza A by PCR: NEGATIVE
Influenza B by PCR: NEGATIVE
SARS Coronavirus 2 by RT PCR: NEGATIVE

## 2020-12-09 LAB — COMPREHENSIVE METABOLIC PANEL
ALT: 89 U/L — ABNORMAL HIGH (ref 0–44)
AST: 89 U/L — ABNORMAL HIGH (ref 15–41)
Albumin: 3.9 g/dL (ref 3.5–5.0)
Alkaline Phosphatase: 114 U/L (ref 38–126)
Anion gap: 10 (ref 5–15)
BUN: 7 mg/dL (ref 6–20)
CO2: 24 mmol/L (ref 22–32)
Calcium: 8.3 mg/dL — ABNORMAL LOW (ref 8.9–10.3)
Chloride: 102 mmol/L (ref 98–111)
Creatinine, Ser: 0.71 mg/dL (ref 0.61–1.24)
GFR, Estimated: >60 mL/min (ref >60)
Glucose, Bld: 118 mg/dL — ABNORMAL HIGH (ref 70–99)
Potassium: 3.4 mmol/L — ABNORMAL LOW (ref 3.5–5.1)
Sodium: 136 mmol/L (ref 135–145)
Total Bilirubin: 1.4 mg/dL — ABNORMAL HIGH (ref 0.3–1.2)
Total Protein: 7.4 g/dL (ref 6.5–8.1)

## 2020-12-09 LAB — CBC WITH DIFFERENTIAL/PLATELET
Abs Immature Granulocytes: 0.07 10*3/uL (ref 0.00–0.07)
Basophils Absolute: 0.1 10*3/uL (ref 0.0–0.1)
Basophils Relative: 1 %
Eosinophils Absolute: 0.1 10*3/uL (ref 0.0–0.5)
Eosinophils Relative: 1 %
HCT: 36.4 % — ABNORMAL LOW (ref 39.0–52.0)
Hemoglobin: 13.2 g/dL (ref 13.0–17.0)
Immature Granulocytes: 1 %
Lymphocytes Relative: 26 %
Lymphs Abs: 2 10*3/uL (ref 0.7–4.0)
MCH: 37.1 pg — ABNORMAL HIGH (ref 26.0–34.0)
MCHC: 36.3 g/dL — ABNORMAL HIGH (ref 30.0–36.0)
MCV: 102.2 fL — ABNORMAL HIGH (ref 80.0–100.0)
Monocytes Absolute: 0.7 10*3/uL (ref 0.1–1.0)
Monocytes Relative: 9 %
Neutro Abs: 5 10*3/uL (ref 1.7–7.7)
Neutrophils Relative %: 62 %
Platelets: 182 10*3/uL (ref 150–400)
RBC: 3.56 MIL/uL — ABNORMAL LOW (ref 4.22–5.81)
RDW: 13.3 % (ref 11.5–15.5)
WBC: 8 10*3/uL (ref 4.0–10.5)
nRBC: 0 % (ref 0.0–0.2)

## 2020-12-09 LAB — URINE DRUG SCREEN, QUALITATIVE (ARMC ONLY)
Amphetamines, Ur Screen: NOT DETECTED
Barbiturates, Ur Screen: NOT DETECTED
Benzodiazepine, Ur Scrn: NOT DETECTED
Cannabinoid 50 Ng, Ur ~~LOC~~: NOT DETECTED
Cocaine Metabolite,Ur ~~LOC~~: NOT DETECTED
MDMA (Ecstasy)Ur Screen: NOT DETECTED
Methadone Scn, Ur: NOT DETECTED
Opiate, Ur Screen: POSITIVE — AB
Phencyclidine (PCP) Ur S: NOT DETECTED
Tricyclic, Ur Screen: NOT DETECTED

## 2020-12-09 LAB — TROPONIN I (HIGH SENSITIVITY)
Troponin I (High Sensitivity): 11 ng/L (ref <18)
Troponin I (High Sensitivity): 10 ng/L (ref <18)

## 2020-12-09 LAB — ETHANOL: Alcohol, Ethyl (B): <10 mg/dL (ref <10)

## 2020-12-09 IMAGING — DX DG CHEST 1V PORT
1 series · 1 of 1 positions shown · non-contrast
Comparison: [DATE]

CLINICAL DATA: Shortness of breath

EXAM:
PORTABLE CHEST 1 VIEW

[chest ap]
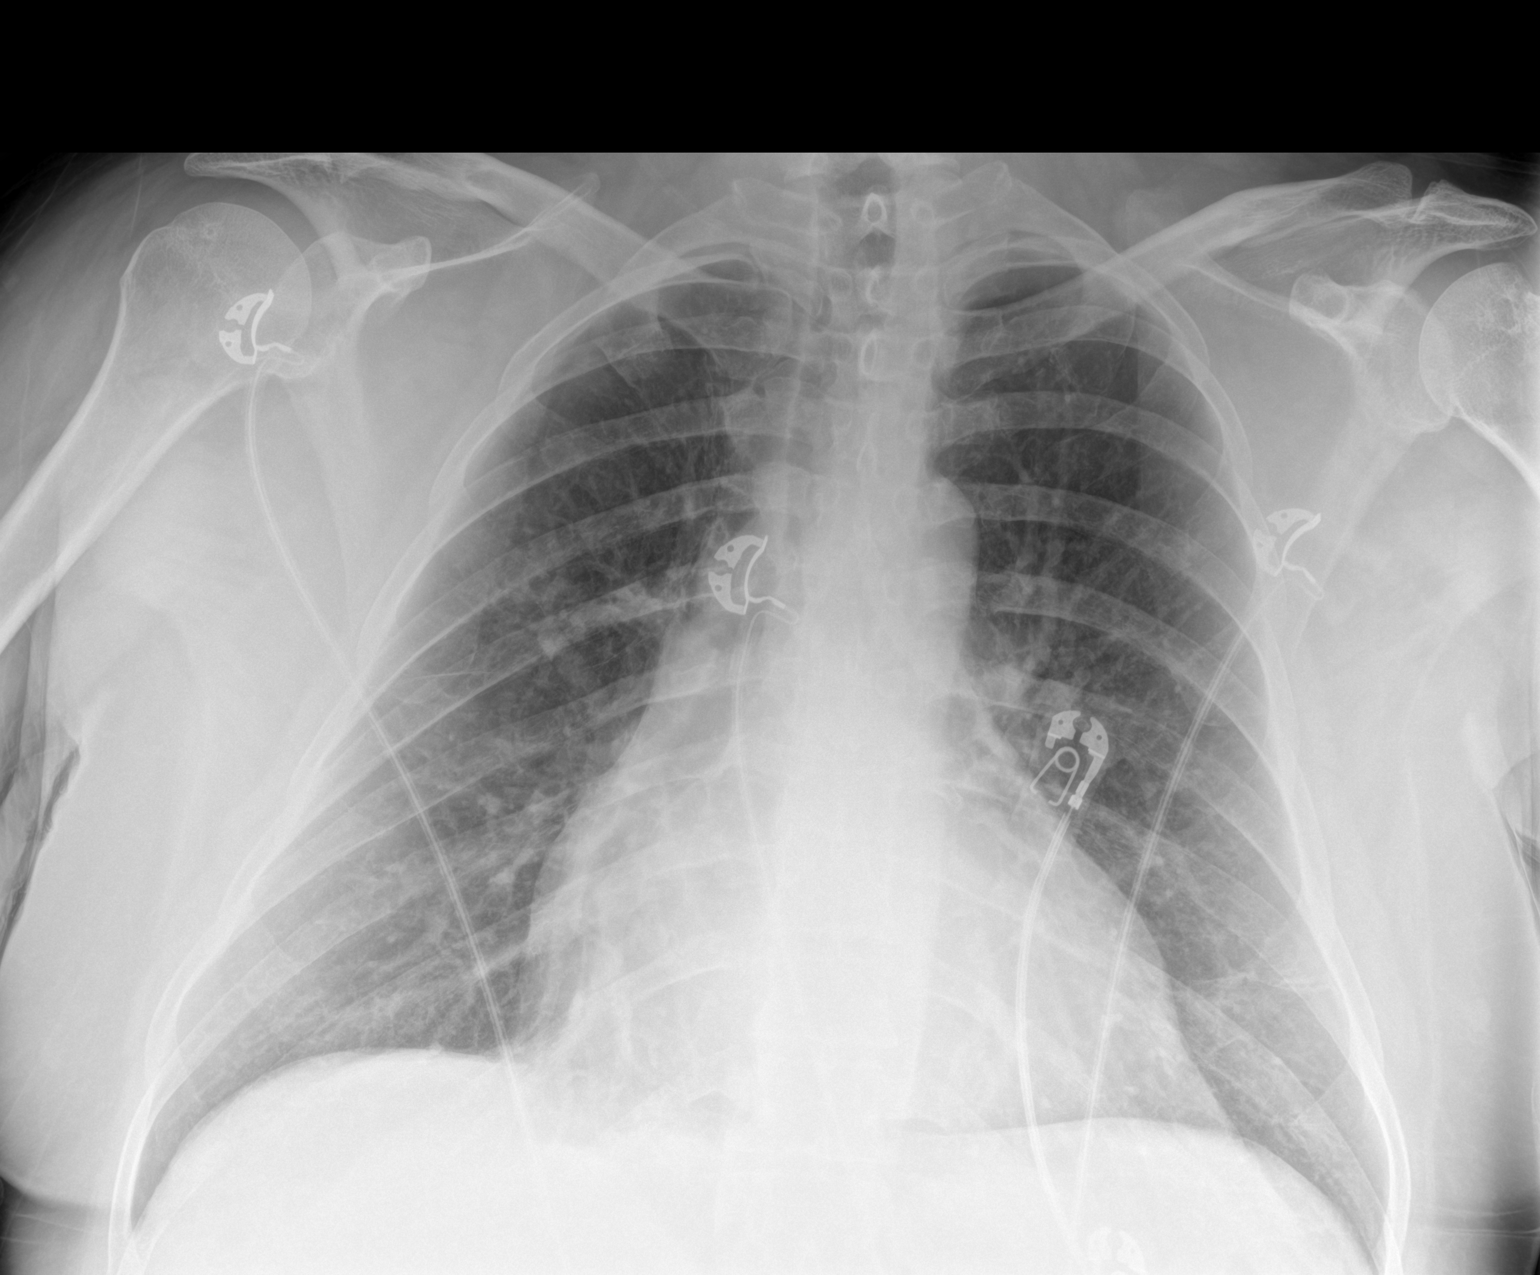

[1 of 1 positions shown; findings below may reference images not displayed]

FINDINGS: Normal heart size for technique. Normal aortic and hilar contours.
Stable lung markings compared to before. There is no edema,
consolidation, effusion, or pneumothorax.
IMPRESSION: Stable from prior.  No acute finding.

## 2020-12-09 MED ORDER — LORAZEPAM 2 MG/ML IJ SOLN
1.0000 mg | Freq: Once | INTRAMUSCULAR | Status: AC
  Administered 2020-12-09: 05:00:00 1 mg via INTRAVENOUS
  Filled 2020-12-09: qty 1

## 2020-12-09 MED ORDER — PREDNISONE 20 MG PO TABS: ORAL_TABLET | ORAL | 0 refills | Status: DC

## 2020-12-09 MED ORDER — ALBUTEROL SULFATE HFA 108 (90 BASE) MCG/ACT IN AERS: 2.0000 | INHALATION_SPRAY | RESPIRATORY_TRACT | 0 refills | Status: DC | PRN

## 2020-12-09 MED ORDER — IPRATROPIUM-ALBUTEROL 0.5-2.5 (3) MG/3ML IN SOLN
3.0000 mL | Freq: Once | RESPIRATORY_TRACT | Status: AC
  Administered 2020-12-09: 08:00:00 3 mL via RESPIRATORY_TRACT
  Filled 2020-12-09: qty 3

## 2020-12-09 MED ORDER — IPRATROPIUM-ALBUTEROL 0.5-2.5 (3) MG/3ML IN SOLN
3.0000 mL | Freq: Once | RESPIRATORY_TRACT | Status: AC
  Administered 2020-12-09: 05:00:00 3 mL via RESPIRATORY_TRACT
  Filled 2020-12-09: qty 3

## 2020-12-09 MED ORDER — KETOROLAC TROMETHAMINE 30 MG/ML IJ SOLN
10.0000 mg | Freq: Once | INTRAMUSCULAR | Status: AC
  Administered 2020-12-09: 06:00:00 9.9 mg via INTRAVENOUS
  Filled 2020-12-09: qty 1

## 2020-12-09 MED ORDER — METHYLPREDNISOLONE SODIUM SUCC 125 MG IJ SOLR
125.0000 mg | Freq: Once | INTRAMUSCULAR | Status: AC
  Administered 2020-12-09: 05:00:00 125 mg via INTRAVENOUS
  Filled 2020-12-09: qty 2

## 2020-12-09 NOTE — ED Notes (Signed)
This RN received phone call from lab regarding BNP hemolyzed, EDP made aware, per EDP no recollect at this time.

## 2020-12-09 NOTE — ED Notes (Signed)
NAD noted at time of D/C. Pt taken to lobby via wheelchair at this time by this RN. Pt denies comments/concerns regarding D/C instructions. This RN addressed concerns regarding chronic pain, per EDP Malinda, unable to give narcotics/DC with prescriptions for narcotics for chronic pain. E-sig pad unavailable, verbal consent for D/C obtained.

## 2020-12-09 NOTE — ED Notes (Signed)
Patient c/o continued pain in his back, both lower and upper. Patient is no longer concerned about his breathing at this time. Warm blanket given and patient repositioned.  EDP informed of patients continued pain. New orders placed.

## 2020-12-09 NOTE — ED Provider Notes (Signed)
Patient is awake he is coughing a little bit and has some slight scattered wheezes.  He says he has his inhalers at home however I will give him 1 more DuoNeb before he leaves.   Nena Polio, MD 12/09/20 [phone]

## 2020-12-09 NOTE — ED Notes (Signed)
Medication administered as ordered. This RN introduced self to patient upon arrival to room. Pt currently doing breathing treatment. Explained plan to have patient do breathing treatment then discharge, pt states "I'm still gonna be hurting". Explained would discuss with EDP.

## 2020-12-09 NOTE — ED Provider Notes (Signed)
[facility] Emergency Department Provider Note   ____________________________________________   Event Date/Time   First MD Initiated Contact with Patient 12/09/20 0451     (approximate)  I have reviewed the triage vital signs and the nursing notes.   HISTORY  Chief Complaint Shortness of Breath    HPI Ethan Wells is a 49 y.o. male who presents to the ED from home with a chief complaint of shortness of breath patient with a history of CAD, EtOH abuse, cocaine abuse, hypertension, ischemic cardiomyopathy who was seen in the ED yesterday for chronic back pain.  Had negative work-up including MRI thoracic and lumbar spine.  States since discharge he has smoked for cigarettes.  States last EtOH 2 days ago and denies current cocaine use.  Reports shortness of breath with associated chest tightness and chronic cough.  Denies fever, abdominal pain, nausea, vomiting, diarrhea or diaphoresis.  Denies recent travel, trauma or hormone use.     Past Medical History:  Diagnosis Date  . CAD (coronary artery disease)    a. 01/2011 Anterior STEMI/Cath/PCI: LM nl, LAD 100d (3.5x3mm Vision BMS placed), LCX 16m, RI 50, RCA min irregs, EF 40% w/ apical, inferoapical HK.  . Cardiac arrest - ventricular fibrillation    a. In setting of STEMI 01/2011  . Cocaine abuse ([place])   . ETOH abuse    a. 6-12 beers/day  . Hemorrhoids   . Hypertension   . Ischemic cardiomyopathy    a. 06/2011 Echo: EF 45-50%, No rwma  . Marijuana abuse   . Tobacco abuse    a. 1/2 ppd x 26 yrs    Patient Active Problem List   Diagnosis Date Noted  . Hyponatremia 06/04/2020  . Hypomagnesemia 06/04/2020  . Chronic systolic CHF (congestive heart failure) ([place]) 06/04/2020  . Leukocytosis 06/04/2020  . Abdominal pain 06/04/2020  . Hypokalemia 06/04/2020  . Ischemic cardiomyopathy   . Polysubstance abuse ([place])   . Chest pain 07/12/2019  . Abdominal pain, RUQ 05/25/2018  . Chest pain, mid  sternal 11/17/2012  . Cocaine abuse ([place])   . Marijuana abuse   . TOBACCO ABUSE 02/26/2011  . HYPERTENSION, BENIGN 02/26/2011  . CAD, NATIVE VESSEL 02/26/2011  . Other specified forms of chronic ischemic heart disease 02/26/2011    Past Surgical History:  Procedure Laterality Date  . CORONARY ANGIOPLASTY WITH STENT PLACEMENT      Prior to Admission medications   Medication Sig Start Date End Date Taking? Authorizing Provider  albuterol (VENTOLIN HFA) 108 (90 Base) MCG/ACT inhaler Inhale 2 puffs into the lungs every 4 (four) hours as needed for wheezing or shortness of breath. 12/09/20   Paulette Blanch, MD  aspirin 81 MG chewable tablet Chew 1 tablet (81 mg total) by mouth daily. 06/07/20   Fritzi Mandes, MD  atorvastatin (LIPITOR) 40 MG tablet Take 1 tablet (40 mg total) by mouth daily. 06/06/20   Fritzi Mandes, MD  folic acid (FOLVITE) 1 MG tablet Take 1 tablet (1 mg total) by mouth daily. 06/07/20   Fritzi Mandes, MD  lisinopril (ZESTRIL) 5 MG tablet Take 1 tablet (5 mg total) by mouth daily. 06/07/20   Fritzi Mandes, MD  Multiple Vitamin (MULTIVITAMIN WITH MINERALS) TABS tablet Take 1 tablet by mouth daily. 06/07/20   Fritzi Mandes, MD  nitroGLYCERIN (NITROSTAT) 0.4 MG SL tablet Place 1 tablet (0.4 mg total) under the tongue every 5 (five) minutes as needed for chest pain. 06/06/20   Fritzi Mandes, MD  pantoprazole (  PROTONIX) 40 MG tablet Take 1 tablet (40 mg total) by mouth daily at 12 noon. 06/06/20   Fritzi Mandes, MD  predniSONE (DELTASONE) 20 MG tablet 3 tablets daily x 4 days 12/09/20   Paulette Blanch, MD  thiamine 100 MG tablet Take 1 tablet (100 mg total) by mouth daily. 06/07/20   Fritzi Mandes, MD    Allergies Penicillins  Family History  Problem Relation Age of Onset  . Coronary artery disease Mother        alive  . Other Other        2 sisters alive and well  . Alcohol abuse Father        died @ 15    Social History Social History   Tobacco Use  . Smoking status: Current Every Day  Smoker    Packs/day: 0.50    Years: 26.00    Pack years: 13.00    Types: Cigarettes  . Smokeless tobacco: Never Used  Vaping Use  . Vaping Use: Never used  Substance Use Topics  . Alcohol use: Yes    Comment: at least a 6 pack of beer daily and often a 12 pack  . Drug use: Not Currently    Types: Cocaine, Marijuana    Comment:  cocaine used several weeks ago; marijuana today    Review of Systems  Constitutional: No fever/chills Eyes: No visual changes. ENT: No sore throat. Cardiovascular: Positive for chest pain. Respiratory: Positive for shortness of breath. Gastrointestinal: No abdominal pain.  No nausea, no vomiting.  No diarrhea.  No constipation. Genitourinary: Negative for dysuria. Musculoskeletal: Negative for back pain. Skin: Negative for rash. Neurological: Negative for headaches, focal weakness or numbness.   ____________________________________________   PHYSICAL EXAM:  VITAL SIGNS: ED Triage Vitals  Enc Vitals Group     BP 12/09/20 0449 (!) 183/104     Pulse Rate 12/09/20 0449 83     Resp 12/09/20 0441 (!) 24     Temp --      Temp Source 12/09/20 0449 Oral     SpO2 12/09/20 0448 99 %     Weight 12/09/20 0441 200 lb (90.7 kg)     Height 12/09/20 0441 5\' 5"  (1.651 m)     Head Circumference --      Peak Flow --      Pain Score 12/09/20 0441 8     Pain Loc --      Pain Edu? --      Excl. in [place]? --     Constitutional: Alert and oriented. Well appearing and in mild acute distress. Eyes: Conjunctivae are normal. PERRL. EOMI. Head: Atraumatic. Nose: No congestion/rhinnorhea. Mouth/Throat: Mucous membranes are moist.   Neck: No stridor.   Cardiovascular: Normal rate, regular rhythm. Grossly normal heart sounds.  Good peripheral circulation. Respiratory: Increased respiratory effort.  No retractions. Lungs with scattered wheezing. Gastrointestinal: Soft and nontender. No distention. No abdominal bruits. No CVA tenderness. Musculoskeletal: No lower  extremity tenderness nor edema.  No joint effusions. Neurologic:  Normal speech and language. No gross focal neurologic deficits are appreciated. No gait instability. Skin:  Skin is warm, dry and intact. No rash noted. Psychiatric: Mood and affect are normal. Speech and behavior are normal.  ____________________________________________   LABS (all labs ordered are listed, but only abnormal results are displayed)  Labs Reviewed  CBC WITH DIFFERENTIAL/PLATELET - Abnormal; Notable for the following components:      Result Value   RBC 3.56 (*)  HCT 36.4 (*)    MCV 102.2 (*)    MCH 37.1 (*)    MCHC 36.3 (*)    All other components within normal limits  COMPREHENSIVE METABOLIC PANEL - Abnormal; Notable for the following components:   Potassium 3.4 (*)    Glucose, Bld 118 (*)    Calcium 8.3 (*)    AST 89 (*)    ALT 89 (*)    Total Bilirubin 1.4 (*)    All other components within normal limits  URINE DRUG SCREEN, QUALITATIVE ([facility] ONLY) - Abnormal; Notable for the following components:   Opiate, Ur Screen POSITIVE (*)    All other components within normal limits  RESP PANEL BY RT-PCR (FLU A&B, COVID) ARPGX2  ETHANOL  TROPONIN I (HIGH SENSITIVITY)  TROPONIN I (HIGH SENSITIVITY)   ____________________________________________  EKG  ED ECG REPORT I, Greyson Riccardi J, the attending physician, personally viewed and interpreted this ECG.   Date: 12/09/2020  EKG Time: 0448  Rate: 84  Rhythm: normal EKG, normal sinus rhythm  Axis: Normal  Intervals:none  ST&T Change: Nonspecific  ____________________________________________  RADIOLOGY I, Aven Cegielski J, personally viewed and evaluated these images (plain radiographs) as part of my medical decision making, as well as reviewing the written report by the radiologist.  ED MD interpretation: No acute cardiopulmonary process  Official radiology report(s): No results  found.  ____________________________________________   PROCEDURES  Procedure(s) performed (including Critical Care):  Procedures   ____________________________________________   INITIAL IMPRESSION / ASSESSMENT AND PLAN / ED COURSE  As part of my medical decision making, I reviewed the following data within the [place] notes reviewed and incorporated, Labs reviewed, EKG interpreted, Old chart reviewed, Radiograph reviewed and Notes from prior ED visits     49 year old male presenting with shortness of breath. Differential includes, but is not limited to, viral syndrome, bronchitis including COPD exacerbation, pneumonia, reactive airway disease including asthma, CHF including exacerbation with or without pulmonary/interstitial edema, pneumothorax, ACS, thoracic trauma, and pulmonary embolism.  PERC negative, low suspicion for PE or aortic dissection.  Will obtain cardiac work-up, respiratory panel.  Administer IV Solu-Medrol and DuoNeb for likely RAD/COPD, Ativan to mitigate potential withdrawal symptoms/possible cocaine induced symptoms.  Will reassess.   Clinical Course as of 12/15/20 [id number]  Sat Dec 09, 2020  [id number] Patient feeling better after IV Ativan. [JS]  [id number] Care transferred to Dr. Cinda Quest at change of shift.  Awaiting results of repeat troponin and respiratory panel.  If unremarkable, anticipate discharge home on Prednisone and albuterol inhaler. [JS]    Clinical Course User Index [JS] Paulette Blanch, MD     ____________________________________________   FINAL CLINICAL IMPRESSION(S) / ED DIAGNOSES  Final diagnoses:  Mild intermittent reactive airway disease with acute exacerbation  Other chronic pain     ED Discharge Orders         Ordered    albuterol (VENTOLIN HFA) 108 (90 Base) MCG/ACT inhaler  Every 4 hours PRN        12/09/20 0657    predniSONE (DELTASONE) 20 MG tablet        12/09/20 [id number]          *Please note:  [name] was evaluated in Emergency Department on 12/15/2020 for the symptoms described in the history of present illness. He was evaluated in the context of the global COVID-19 pandemic, which necessitated consideration that the patient might be at risk for infection with the SARS-CoV-2 virus that causes COVID-19. Institutional  protocols and algorithms that pertain to the evaluation of patients at risk for COVID-19 are in a state of rapid change based on information released by regulatory bodies including the CDC and federal and state organizations. These policies and algorithms were followed during the patient's care in the ED.  Some ED evaluations and interventions may be delayed as a result of limited staffing during and the pandemic.*   Note:  This document was prepared using Dragon voice recognition software and may include unintentional dictation errors.   Paulette Blanch, MD 12/15/20 [phone]

## 2020-12-09 NOTE — ED Triage Notes (Addendum)
Pt states has numb fingers, numb lips, chills, clamminess, throat pain, shortness of breath,back pain for 3 days. Pt was seen yesterday for same thing and released today. Pt states has gotten worse since discharge.

## 2020-12-09 NOTE — ED Notes (Signed)
Patient visualized sleeping in stretcher. Rise and fall of chest noted. Expiratory wheezes no longer audible, patient no longer tachypnic.

## 2020-12-09 NOTE — ED Provider Notes (Signed)
Patient sleeping comfortably.  Troponins are negative chest x-ray is clear B NP had hemolyzed but patient is not hypoxic and again comfortable with a clear chest x-ray will not repeat the BMP.   Nena Polio, MD 12/09/20 [phone]

## 2020-12-09 NOTE — Discharge Instructions (Signed)
Finish Prednisone 60mg  daily x4 days.  You may use Albuterol inhaler 2 puffs every 4 hours as needed for difficulty breathing.  Return to the ER for worsening symptoms, persistent vomiting, difficulty breathing or other concerns.

## 2020-12-19 ENCOUNTER — Encounter: Payer: Self-pay | Admitting: Emergency Medicine

## 2020-12-19 ENCOUNTER — Other Ambulatory Visit: Payer: Self-pay

## 2020-12-19 ENCOUNTER — Inpatient Hospital Stay
Admission: EM | Admit: 2020-12-19 | Discharge: 2020-12-23 | DRG: 918 | Disposition: A | Discharge status: Home / Self Care | Payer: Medicare HMO | Attending: Internal Medicine | Admitting: Internal Medicine

## 2020-12-19 ENCOUNTER — Emergency Department: Payer: Medicare HMO

## 2020-12-19 DIAGNOSIS — Z7982 Long term (current) use of aspirin: Secondary | ICD-10-CM

## 2020-12-19 DIAGNOSIS — Z811 Family history of alcohol abuse and dependence: Secondary | ICD-10-CM | POA: Diagnosis not present

## 2020-12-19 DIAGNOSIS — R778 Other specified abnormalities of plasma proteins: Secondary | ICD-10-CM | POA: Diagnosis not present

## 2020-12-19 DIAGNOSIS — Z955 Presence of coronary angioplasty implant and graft: Secondary | ICD-10-CM | POA: Diagnosis not present

## 2020-12-19 DIAGNOSIS — I110 Hypertensive heart disease with heart failure: Secondary | ICD-10-CM | POA: Diagnosis not present

## 2020-12-19 DIAGNOSIS — I5022 Chronic systolic (congestive) heart failure: Secondary | ICD-10-CM | POA: Diagnosis not present

## 2020-12-19 DIAGNOSIS — R0602 Shortness of breath: Secondary | ICD-10-CM | POA: Diagnosis not present

## 2020-12-19 DIAGNOSIS — M549 Dorsalgia, unspecified: Secondary | ICD-10-CM | POA: Diagnosis not present

## 2020-12-19 DIAGNOSIS — R079 Chest pain, unspecified: Secondary | ICD-10-CM | POA: Diagnosis not present

## 2020-12-19 DIAGNOSIS — I2510 Atherosclerotic heart disease of native coronary artery without angina pectoris: Secondary | ICD-10-CM | POA: Diagnosis present

## 2020-12-19 DIAGNOSIS — R0600 Dyspnea, unspecified: Secondary | ICD-10-CM | POA: Diagnosis not present

## 2020-12-19 DIAGNOSIS — F1410 Cocaine abuse, uncomplicated: Secondary | ICD-10-CM | POA: Diagnosis not present

## 2020-12-19 DIAGNOSIS — K625 Hemorrhage of anus and rectum: Secondary | ICD-10-CM | POA: Diagnosis present

## 2020-12-19 DIAGNOSIS — I25110 Atherosclerotic heart disease of native coronary artery with unstable angina pectoris: Secondary | ICD-10-CM | POA: Diagnosis not present

## 2020-12-19 DIAGNOSIS — F1910 Other psychoactive substance abuse, uncomplicated: Secondary | ICD-10-CM | POA: Diagnosis not present

## 2020-12-19 DIAGNOSIS — Z9119 Patient's noncompliance with other medical treatment and regimen: Secondary | ICD-10-CM

## 2020-12-19 DIAGNOSIS — T405X1A Poisoning by cocaine, accidental (unintentional), initial encounter: Secondary | ICD-10-CM | POA: Diagnosis not present

## 2020-12-19 DIAGNOSIS — I252 Old myocardial infarction: Secondary | ICD-10-CM | POA: Diagnosis not present

## 2020-12-19 DIAGNOSIS — R0789 Other chest pain: Secondary | ICD-10-CM | POA: Diagnosis present

## 2020-12-19 DIAGNOSIS — I255 Ischemic cardiomyopathy: Secondary | ICD-10-CM | POA: Diagnosis present

## 2020-12-19 DIAGNOSIS — Z880 Allergy status to penicillin: Secondary | ICD-10-CM | POA: Diagnosis not present

## 2020-12-19 DIAGNOSIS — R21 Rash and other nonspecific skin eruption: Secondary | ICD-10-CM | POA: Diagnosis not present

## 2020-12-19 DIAGNOSIS — Z6834 Body mass index (BMI) 34.0-34.9, adult: Secondary | ICD-10-CM

## 2020-12-19 DIAGNOSIS — F17210 Nicotine dependence, cigarettes, uncomplicated: Secondary | ICD-10-CM | POA: Diagnosis present

## 2020-12-19 DIAGNOSIS — E669 Obesity, unspecified: Secondary | ICD-10-CM | POA: Diagnosis present

## 2020-12-19 DIAGNOSIS — Z8249 Family history of ischemic heart disease and other diseases of the circulatory system: Secondary | ICD-10-CM | POA: Diagnosis not present

## 2020-12-19 DIAGNOSIS — F1010 Alcohol abuse, uncomplicated: Secondary | ICD-10-CM | POA: Diagnosis present

## 2020-12-19 DIAGNOSIS — I214 Non-ST elevation (NSTEMI) myocardial infarction: Secondary | ICD-10-CM | POA: Diagnosis not present

## 2020-12-19 DIAGNOSIS — F1020 Alcohol dependence, uncomplicated: Secondary | ICD-10-CM

## 2020-12-19 DIAGNOSIS — I25119 Atherosclerotic heart disease of native coronary artery with unspecified angina pectoris: Secondary | ICD-10-CM | POA: Diagnosis present

## 2020-12-19 DIAGNOSIS — R000 Tachycardia, unspecified: Secondary | ICD-10-CM | POA: Diagnosis not present

## 2020-12-19 DIAGNOSIS — Z20822 Contact with and (suspected) exposure to covid-19: Secondary | ICD-10-CM | POA: Diagnosis present

## 2020-12-19 DIAGNOSIS — Z720 Tobacco use: Secondary | ICD-10-CM | POA: Diagnosis not present

## 2020-12-19 DIAGNOSIS — R0902 Hypoxemia: Secondary | ICD-10-CM | POA: Diagnosis not present

## 2020-12-19 DIAGNOSIS — R069 Unspecified abnormalities of breathing: Secondary | ICD-10-CM | POA: Diagnosis not present

## 2020-12-19 DIAGNOSIS — I517 Cardiomegaly: Secondary | ICD-10-CM | POA: Diagnosis not present

## 2020-12-19 LAB — CBC WITH DIFFERENTIAL/PLATELET
Abs Immature Granulocytes: 0 10*3/uL (ref 0.00–0.07)
Basophils Absolute: 0.1 10*3/uL (ref 0.0–0.1)
Basophils Relative: 1 %
Eosinophils Absolute: 0 10*3/uL (ref 0.0–0.5)
Eosinophils Relative: 0 %
HCT: 38.8 % — ABNORMAL LOW (ref 39.0–52.0)
Hemoglobin: 13.9 g/dL (ref 13.0–17.0)
Lymphocytes Relative: 37 %
Lymphs Abs: 4.3 10*3/uL — ABNORMAL HIGH (ref 0.7–4.0)
MCH: 36.4 pg — ABNORMAL HIGH (ref 26.0–34.0)
MCHC: 35.8 g/dL (ref 30.0–36.0)
MCV: 101.6 fL — ABNORMAL HIGH (ref 80.0–100.0)
Monocytes Absolute: 0.9 10*3/uL (ref 0.1–1.0)
Monocytes Relative: 8 %
Neutro Abs: 6.3 10*3/uL (ref 1.7–7.7)
Neutrophils Relative %: 54 %
Platelets: 240 10*3/uL (ref 150–400)
RBC: 3.82 MIL/uL — ABNORMAL LOW (ref 4.22–5.81)
RDW: 13.6 % (ref 11.5–15.5)
WBC: 11.6 10*3/uL — ABNORMAL HIGH (ref 4.0–10.5)
nRBC: 0 % (ref 0.0–0.2)

## 2020-12-19 LAB — COMPREHENSIVE METABOLIC PANEL
ALT: 35 U/L (ref 0–44)
AST: 41 U/L (ref 15–41)
Albumin: 4.4 g/dL (ref 3.5–5.0)
Alkaline Phosphatase: 114 U/L (ref 38–126)
Anion gap: 13 (ref 5–15)
BUN: 5 mg/dL — ABNORMAL LOW (ref 6–20)
CO2: 23 mmol/L (ref 22–32)
Calcium: 9 mg/dL (ref 8.9–10.3)
Chloride: 99 mmol/L (ref 98–111)
Creatinine, Ser: 0.59 mg/dL — ABNORMAL LOW (ref 0.61–1.24)
GFR, Estimated: >60 mL/min (ref >60)
Glucose, Bld: 114 mg/dL — ABNORMAL HIGH (ref 70–99)
Potassium: 3.8 mmol/L (ref 3.5–5.1)
Sodium: 135 mmol/L (ref 135–145)
Total Bilirubin: 0.7 mg/dL (ref 0.3–1.2)
Total Protein: 8.2 g/dL — ABNORMAL HIGH (ref 6.5–8.1)

## 2020-12-19 LAB — TROPONIN I (HIGH SENSITIVITY)
Troponin I (High Sensitivity): 346 ng/L (ref <18)
Troponin I (High Sensitivity): 347 ng/L (ref <18)

## 2020-12-19 LAB — APTT: aPTT: 28 seconds (ref 24–36)

## 2020-12-19 LAB — PROTIME-INR
INR: 0.9 (ref 0.8–1.2)
Prothrombin Time: 11.5 seconds (ref 11.4–15.2)

## 2020-12-19 IMAGING — CR DG CHEST 2V
1 series · 2 of 2 positions shown · non-contrast
Comparison: Chest radiograph dated [DATE].

CLINICAL DATA: 49-year-old male with chest pain and shortness of
breath.

EXAM:
CHEST - 2 VIEW

[Series 1: w chest pa · 0.14mm/px · 2 of 2 slices shown]
[im 1/2]
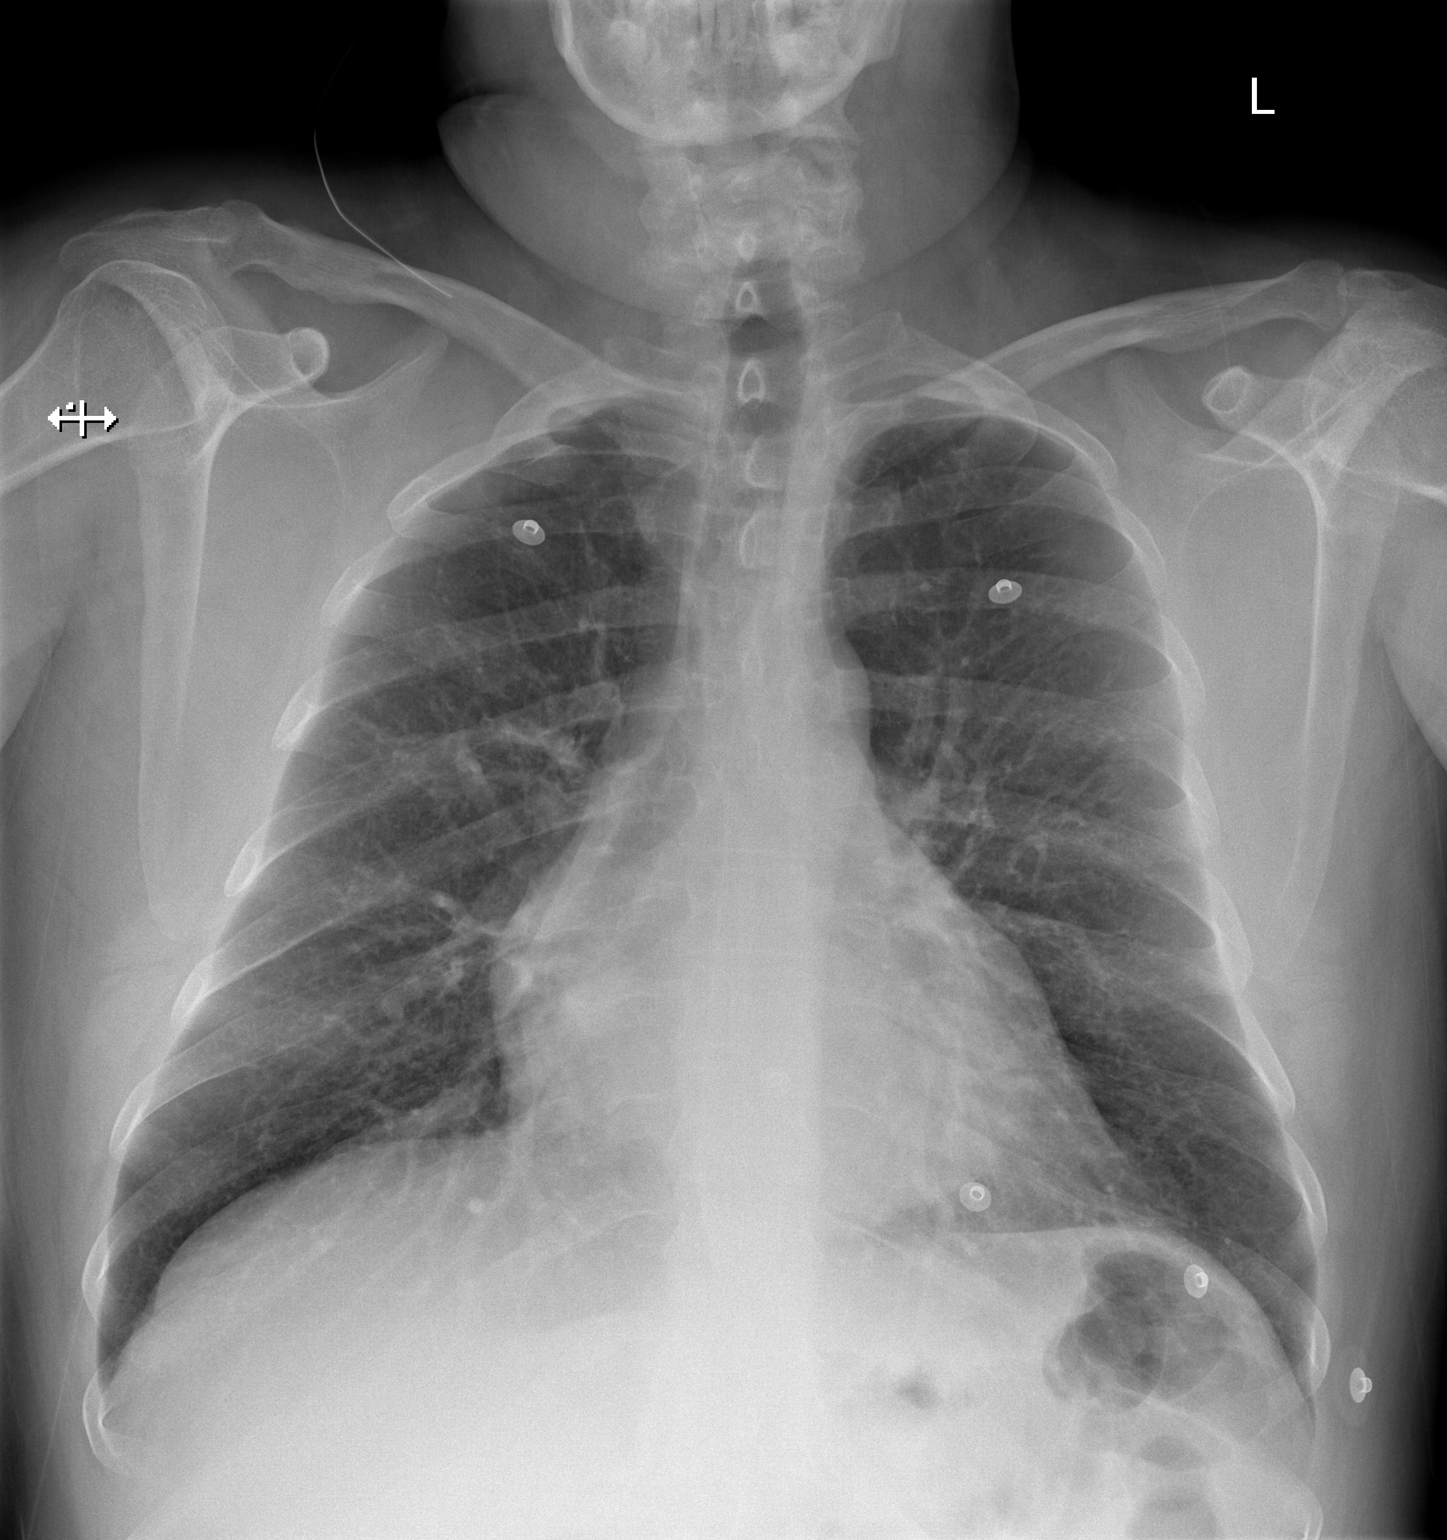
[im 2/2]
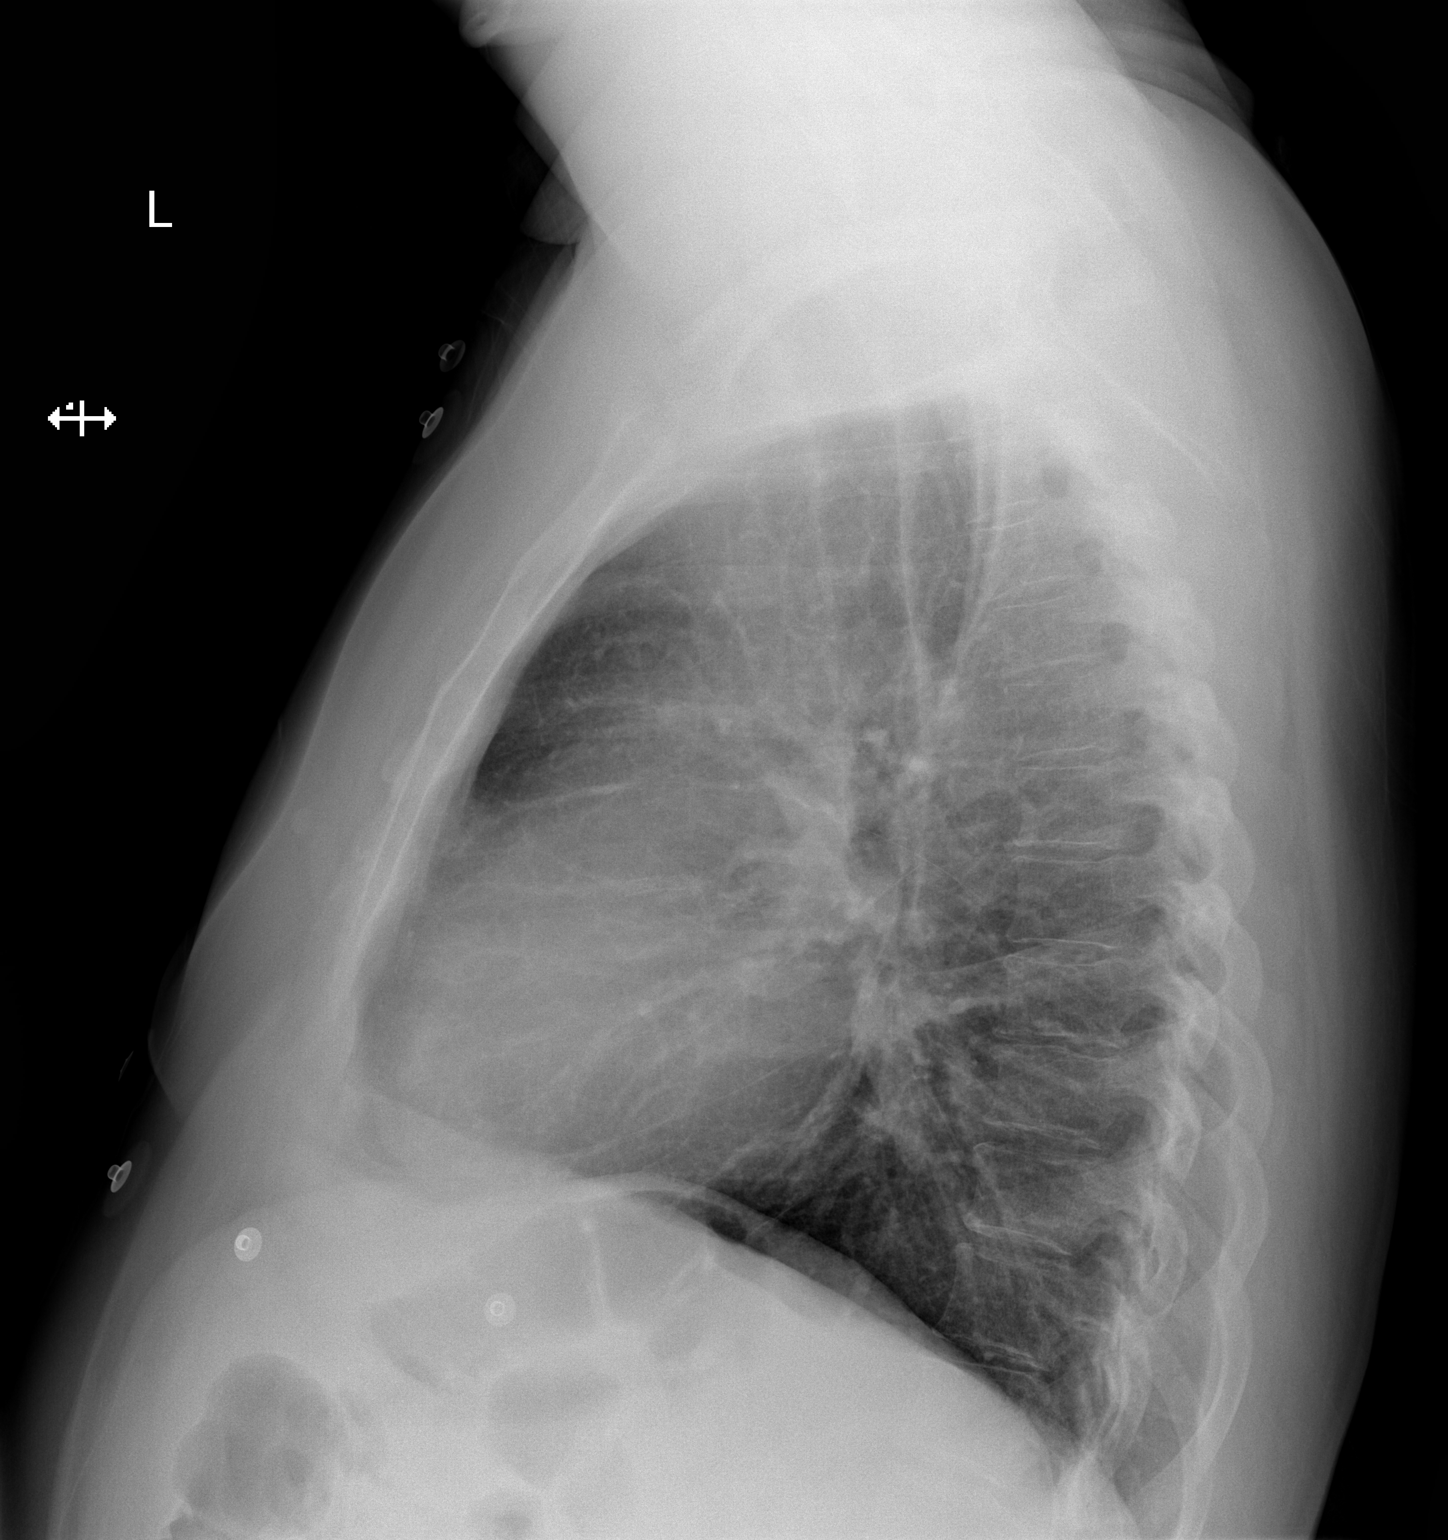

[2 of 2 positions shown; findings below may reference images not displayed]

FINDINGS: No focal consolidation, pleural effusion or pneumothorax. The
cardiac silhouette is within limits. No acute osseous pathology.
IMPRESSION: No active cardiopulmonary disease.

## 2020-12-19 MED ORDER — HEPARIN (PORCINE) 25000 UT/250ML-% IV SOLN
1250.0000 [IU]/h | INTRAVENOUS | Status: DC
  Administered 2020-12-19: 1000 [IU]/h via INTRAVENOUS
  Filled 2020-12-19: qty 250

## 2020-12-19 MED ORDER — HEPARIN (PORCINE) 25000 UT/250ML-% IV SOLN: 10.0000 [IU]/kg/h | INTRAVENOUS | Status: DC

## 2020-12-19 MED ORDER — ASPIRIN 81 MG PO CHEW: 81.0000 mg | CHEWABLE_TABLET | Freq: Every day | ORAL | Status: DC

## 2020-12-19 MED ORDER — ONDANSETRON HCL 4 MG/2ML IJ SOLN: 4.0000 mg | Freq: Four times a day (QID) | INTRAMUSCULAR | Status: DC | PRN

## 2020-12-19 MED ORDER — IOHEXOL 350 MG/ML SOLN
100.0000 mL | Freq: Once | INTRAVENOUS | Status: AC | PRN
  Administered 2020-12-19: 100 mL via INTRAVENOUS

## 2020-12-19 MED ORDER — ASPIRIN EC 81 MG PO TBEC
81.0000 mg | DELAYED_RELEASE_TABLET | Freq: Every day | ORAL | Status: DC
  Administered 2020-12-20 – 2020-12-23 (×4): 81 mg via ORAL
  Filled 2020-12-19 (×4): qty 1

## 2020-12-19 MED ORDER — ADULT MULTIVITAMIN W/MINERALS CH
1.0000 | ORAL_TABLET | Freq: Every day | ORAL | Status: DC
  Administered 2020-12-20 – 2020-12-22 (×3): 1 via ORAL
  Filled 2020-12-19 (×4): qty 1

## 2020-12-19 MED ORDER — LORAZEPAM 2 MG/ML IJ SOLN
0.0000 mg | Freq: Four times a day (QID) | INTRAMUSCULAR | Status: AC
  Administered 2020-12-20 (×2): 1 mg via INTRAVENOUS
  Filled 2020-12-19: qty 1

## 2020-12-19 MED ORDER — ACETAMINOPHEN 325 MG PO TABS: 650.0000 mg | ORAL_TABLET | ORAL | Status: DC | PRN

## 2020-12-19 MED ORDER — LORAZEPAM 1 MG PO TABS: 1.0000 mg | ORAL_TABLET | ORAL | Status: AC | PRN

## 2020-12-19 MED ORDER — ONDANSETRON HCL 4 MG/2ML IJ SOLN
4.0000 mg | Freq: Once | INTRAMUSCULAR | Status: AC
  Administered 2020-12-19: 4 mg via INTRAVENOUS
  Filled 2020-12-19: qty 2

## 2020-12-19 MED ORDER — PANTOPRAZOLE SODIUM 40 MG PO TBEC
40.0000 mg | DELAYED_RELEASE_TABLET | Freq: Every day | ORAL | Status: DC
  Administered 2020-12-20 – 2020-12-22 (×3): 40 mg via ORAL
  Filled 2020-12-19 (×3): qty 1

## 2020-12-19 MED ORDER — ASPIRIN 81 MG PO CHEW: 324.0000 mg | CHEWABLE_TABLET | ORAL | Status: AC

## 2020-12-19 MED ORDER — LORAZEPAM 2 MG/ML IJ SOLN
1.0000 mg | INTRAMUSCULAR | Status: AC | PRN
  Filled 2020-12-19: qty 1

## 2020-12-19 MED ORDER — ATORVASTATIN CALCIUM 20 MG PO TABS
40.0000 mg | ORAL_TABLET | Freq: Every day | ORAL | Status: DC
Start: 2020-12-20 — End: 2020-12-21
  Administered 2020-12-20: 40 mg via ORAL
  Filled 2020-12-19: qty 2

## 2020-12-19 MED ORDER — HEPARIN BOLUS VIA INFUSION
4000.0000 [IU] | Freq: Once | INTRAVENOUS | Status: AC
  Administered 2020-12-19: 4000 [IU] via INTRAVENOUS
  Filled 2020-12-19: qty 4000

## 2020-12-19 MED ORDER — NITROGLYCERIN 0.4 MG SL SUBL: 0.4000 mg | SUBLINGUAL_TABLET | SUBLINGUAL | Status: DC | PRN

## 2020-12-19 MED ORDER — FOLIC ACID 1 MG PO TABS
1.0000 mg | ORAL_TABLET | Freq: Every day | ORAL | Status: DC
  Administered 2020-12-20 – 2020-12-23 (×4): 1 mg via ORAL
  Filled 2020-12-19 (×4): qty 1

## 2020-12-19 MED ORDER — ASPIRIN 81 MG PO CHEW
324.0000 mg | CHEWABLE_TABLET | Freq: Once | ORAL | Status: AC
  Administered 2020-12-19: 324 mg via ORAL
  Filled 2020-12-19: qty 4

## 2020-12-19 MED ORDER — ASPIRIN 300 MG RE SUPP: 300.0000 mg | RECTAL | Status: AC

## 2020-12-19 MED ORDER — LORAZEPAM 2 MG/ML IJ SOLN: 0.0000 mg | Freq: Two times a day (BID) | INTRAMUSCULAR | Status: DC

## 2020-12-19 MED ORDER — HEPARIN SODIUM (PORCINE) 5000 UNIT/ML IJ SOLN: 4000.0000 [IU] | Freq: Once | INTRAMUSCULAR | Status: DC

## 2020-12-19 MED ORDER — NITROGLYCERIN 2 % TD OINT
0.5000 [in_us] | TOPICAL_OINTMENT | Freq: Once | TRANSDERMAL | Status: AC
  Administered 2020-12-20: 0.5 [in_us] via TOPICAL
  Filled 2020-12-19: qty 1

## 2020-12-19 MED ORDER — THIAMINE HCL 100 MG/ML IJ SOLN
100.0000 mg | Freq: Every day | INTRAMUSCULAR | Status: DC
  Filled 2020-12-19 (×2): qty 2

## 2020-12-19 MED ORDER — THIAMINE HCL 100 MG PO TABS
100.0000 mg | ORAL_TABLET | Freq: Every day | ORAL | Status: DC
  Administered 2020-12-20 – 2020-12-23 (×4): 100 mg via ORAL
  Filled 2020-12-19 (×4): qty 1

## 2020-12-19 MED ORDER — MORPHINE SULFATE (PF) 4 MG/ML IV SOLN
4.0000 mg | Freq: Once | INTRAVENOUS | Status: AC
  Administered 2020-12-19: 4 mg via INTRAVENOUS
  Filled 2020-12-19: qty 1

## 2020-12-19 NOTE — ED Triage Notes (Signed)
Pt to ED via EMS from home c/o SOB, pain in back and lungs that started this morning, productive clear cough, runny nose.  Denies blood thinners.  Lungs auscultated in triage, clear but diminished.

## 2020-12-19 NOTE — ED Triage Notes (Signed)
EMS brings pt in from home for c/o CP and SHOB since this am; hx asthma, COPD, stent placement, HTN; seen recently for same

## 2020-12-19 NOTE — Progress Notes (Signed)
ANTICOAGULATION CONSULT NOTE - Initial Consult  Pharmacy Consult for Heparin  Indication: chest pain/ACS  Allergies  Allergen Reactions  . Penicillins Hives    Patient Measurements: Height: 5\' 5"  (165.1 cm) Weight: 95.3 kg (210 lb) IBW/kg (Calculated) : 61.5 Heparin Dosing Weight: 82.4 kg   Vital Signs: Temp: 98.4 F (36.9 C) (01/04 2056) Temp Source: Oral (01/04 2056) BP: 142/88 (01/04 2056) Pulse Rate: 103 (01/04 2056)  Labs: Recent Labs    12/19/20 2114  HGB 13.9  HCT 38.8*  PLT 240  CREATININE 0.59*  TROPONINIHS 346*    Estimated Creatinine Clearance: 119.8 mL/min (A) (by C-G formula based on SCr of 0.59 mg/dL (L)).   Medical History: Past Medical History:  Diagnosis Date  . CAD (coronary artery disease)    a. 01/2011 Anterior STEMI/Cath/PCI: LM nl, LAD 100d (3.5x2mm Vision BMS placed), LCX 76m, RI 50, RCA min irregs, EF 40% w/ apical, inferoapical HK.  . Cardiac arrest - ventricular fibrillation    a. In setting of STEMI 01/2011  . Cocaine abuse ([facility])   . ETOH abuse    a. 6-12 beers/day  . Hemorrhoids   . Hypertension   . Ischemic cardiomyopathy    a. 06/2011 Echo: EF 45-50%, No rwma  . Marijuana abuse   . Tobacco abuse    a. 1/2 ppd x 26 yrs    Medications:  (Not in a hospital admission)   Assessment: Pharmacy consulted to dose heparin in this 50 year old male admitted with ACS/NSTEMI.  No prior anticoag noted.  CrCl = 119.8 ml/min   Goal of Therapy:  Heparin level 0.3-0.7 units/ml Monitor platelets by anticoagulation protocol: Yes   Plan:  Give 4000 units bolus x 1 Start heparin infusion at 1000 units/hr Check anti-Xa level in 6 hours and daily while on heparin Continue to monitor H&H and platelets  Robbins,Jason D 12/19/2020,10:34 PM

## 2020-12-19 NOTE — ED Provider Notes (Addendum)
[facility] Emergency Department Provider Note   ____________________________________________   Event Date/Time   First MD Initiated Contact with Patient 12/19/20 2224     (approximate)  I have reviewed the triage vital signs and the nursing notes.   HISTORY  Chief Complaint Shortness of Breath (/)    HPI Ethan Wells is a 50 y.o. male who comes in complaining shortness of breath and pleuritic pain in his back that started this morning.  He has a productive clear cough runny nose.  He denies any blood thinners or recent surgery.  He says he has some pink stool and hemorrhoids.  Rectal exam did not reveal any pink stool or Hemoccult positive stool or hemorrhoids or masses.  Patient does look like he is in some distress.  He reports he has had the symptoms several times in the past and nothing is ever been found.  Today he has a troponin over 300.  He was seen here for shortness of breath and chest tightness that felt like his COPD/asthma on Christmas day troponin was negative and EKG was negative at that time.         Past Medical History:  Diagnosis Date  . CAD (coronary artery disease)    a. 01/2011 Anterior STEMI/Cath/PCI: LM nl, LAD 100d (3.5x77mm Vision BMS placed), LCX 39m, RI 50, RCA min irregs, EF 40% w/ apical, inferoapical HK.  . Cardiac arrest - ventricular fibrillation    a. In setting of STEMI 01/2011  . Cocaine abuse ([place])   . ETOH abuse    a. 6-12 beers/day  . Hemorrhoids   . Hypertension   . Ischemic cardiomyopathy    a. 06/2011 Echo: EF 45-50%, No rwma  . Marijuana abuse   . Tobacco abuse    a. 1/2 ppd x 26 yrs    Patient Active Problem List   Diagnosis Date Noted  . Hyponatremia 06/04/2020  . Hypomagnesemia 06/04/2020  . Chronic systolic CHF (congestive heart failure) ([place]) 06/04/2020  . Leukocytosis 06/04/2020  . Abdominal pain 06/04/2020  . Hypokalemia 06/04/2020  . Ischemic cardiomyopathy   . Polysubstance abuse  ([place])   . Chest pain 07/12/2019  . Abdominal pain, RUQ 05/25/2018  . Chest pain, mid sternal 11/17/2012  . Cocaine abuse ([place])   . Marijuana abuse   . TOBACCO ABUSE 02/26/2011  . HYPERTENSION, BENIGN 02/26/2011  . CAD, NATIVE VESSEL 02/26/2011  . Other specified forms of chronic ischemic heart disease 02/26/2011    Past Surgical History:  Procedure Laterality Date  . CORONARY ANGIOPLASTY WITH STENT PLACEMENT      Prior to Admission medications   Medication Sig Start Date End Date Taking? Authorizing Provider  albuterol (VENTOLIN HFA) 108 (90 Base) MCG/ACT inhaler Inhale 2 puffs into the lungs every 4 (four) hours as needed for wheezing or shortness of breath. 12/09/20   Paulette Blanch, MD  aspirin 81 MG chewable tablet Chew 1 tablet (81 mg total) by mouth daily. 06/07/20   Fritzi Mandes, MD  atorvastatin (LIPITOR) 40 MG tablet Take 1 tablet (40 mg total) by mouth daily. 06/06/20   Fritzi Mandes, MD  folic acid (FOLVITE) 1 MG tablet Take 1 tablet (1 mg total) by mouth daily. 06/07/20   Fritzi Mandes, MD  lisinopril (ZESTRIL) 5 MG tablet Take 1 tablet (5 mg total) by mouth daily. 06/07/20   Fritzi Mandes, MD  Multiple Vitamin (MULTIVITAMIN WITH MINERALS) TABS tablet Take 1 tablet by mouth daily. 06/07/20   Fritzi Mandes,  MD  nitroGLYCERIN (NITROSTAT) 0.4 MG SL tablet Place 1 tablet (0.4 mg total) under the tongue every 5 (five) minutes as needed for chest pain. 06/06/20   Enedina Finner, MD  pantoprazole (PROTONIX) 40 MG tablet Take 1 tablet (40 mg total) by mouth daily at 12 noon. 06/06/20   Enedina Finner, MD  predniSONE (DELTASONE) 20 MG tablet 3 tablets daily x 4 days 12/09/20   Irean Hong, MD  thiamine 100 MG tablet Take 1 tablet (100 mg total) by mouth daily. 06/07/20   Enedina Finner, MD    Allergies Penicillins  Family History  Problem Relation Age of Onset  . Coronary artery disease Mother        alive  . Other Other        2 sisters alive and well  . Alcohol abuse Father        died @ 13     Social History Social History   Tobacco Use  . Smoking status: Current Every Day Smoker    Packs/day: 0.50    Years: 26.00    Pack years: 13.00    Types: Cigarettes  . Smokeless tobacco: Never Used  Vaping Use  . Vaping Use: Never used  Substance Use Topics  . Alcohol use: Yes    Comment: at least a 6 pack of beer daily and often a 12 pack  . Drug use: Not Currently    Types: Cocaine, Marijuana    Comment:  cocaine used several weeks ago; marijuana today    Review of Systems  Constitutional: No fever/chills Eyes: No visual changes. ENT: No sore throat. Cardiovascular:  chest pain. Respiratory:  shortness of breath. Gastrointestinal: No abdominal pain.  No nausea, no vomiting.  No diarrhea.  No constipation. Genitourinary: Negative for dysuria. Musculoskeletal:  back pain. Skin: Negative for rash. Neurological: Negative for headaches, focal weakness   ____________________________________________   PHYSICAL EXAM:  VITAL SIGNS: ED Triage Vitals  Enc Vitals Group     BP 12/19/20 2056 (!) 142/88     Pulse Rate 12/19/20 2056 (!) 103     Resp 12/19/20 2056 20     Temp 12/19/20 2056 98.4 F (36.9 C)     Temp Source 12/19/20 2056 Oral     SpO2 12/19/20 2049 98 %     Weight 12/19/20 2108 210 lb (95.3 kg)     Height 12/19/20 2108 5\' 5"  (1.651 m)     Head Circumference --      Peak Flow --      Pain Score 12/19/20 2107 10     Pain Loc --      Pain Edu? --      Excl. in GC? --     Constitutional: Alert and oriented.  Looks uncomfortable Eyes: Conjunctivae are normal.  Head: Atraumatic. Nose: No congestion/rhinnorhea. Mouth/Throat: Mucous membranes are moist.  Oropharynx non-erythematous. Neck: No stridor.  Cardiovascular: Normal rate, regular rhythm. Grossly normal heart sounds.  Good peripheral circulation. Respiratory: Normal respiratory effort.  No retractions. Lungs CTAB. Gastrointestinal: Soft and nontender. No distention. No abdominal bruits. No CVA  tenderness. Musculoskeletal: No lower extremity tenderness nor edema.   Neurologic:  Normal speech and language. No gross focal neurologic deficits are appreciated. No gait instability. Skin:  Skin is warm, dry and intact. No rash noted.  ____________________________________________   LABS (all labs ordered are listed, but only abnormal results are displayed)  Labs Reviewed  CBC WITH DIFFERENTIAL/PLATELET - Abnormal; Notable for the following components:  Result Value   WBC 11.6 (*)    RBC 3.82 (*)    HCT 38.8 (*)    MCV 101.6 (*)    MCH 36.4 (*)    Lymphs Abs 4.3 (*)    All other components within normal limits  COMPREHENSIVE METABOLIC PANEL - Abnormal; Notable for the following components:   Glucose, Bld 114 (*)    BUN 5 (*)    Creatinine, Ser 0.59 (*)    Total Protein 8.2 (*)    All other components within normal limits  TROPONIN I (HIGH SENSITIVITY) - Abnormal; Notable for the following components:   Troponin I (High Sensitivity) 346 (*)    All other components within normal limits  RESP PANEL BY RT-PCR (FLU A&B, COVID) ARPGX2  PROTIME-INR  APTT  TROPONIN I (HIGH SENSITIVITY)   ____________________________________________  EKG EKG read interpreted by me shows normal sinus rhythm rate of 97 normal axis looks very similar to EKGs from the 24th and 25th of last month. ____________________________________________  RADIOLOGY Jill Poling, personally viewed and evaluated these images (plain radiographs) as part of my medical decision making, as well as reviewing the written report by the radiologist.  ED MD interpretation: Chest x-ray read by radiology reviewed by me shows no acute disease CT read by radiology as no PE or dissection Official radiology report(s): DG Chest 2 View  Result Date: 12/19/2020 CLINICAL DATA:  50 year old male with chest pain and shortness of breath. EXAM: CHEST - 2 VIEW COMPARISON:  Chest radiograph dated 12/09/2020. FINDINGS: No focal  consolidation, pleural effusion or pneumothorax. The cardiac silhouette is within limits. No acute osseous pathology. IMPRESSION: No active cardiopulmonary disease. Electronically Signed   By: Elgie Collard M.D.   On: 12/19/2020 21:26   CT Angio Chest PE W and/or Wo Contrast  Result Date: 12/19/2020 CLINICAL DATA:  Chest pain and short of breath EXAM: CT ANGIOGRAPHY CHEST WITH CONTRAST TECHNIQUE: Multidetector CT imaging of the chest was performed using the standard protocol during bolus administration of intravenous contrast. Multiplanar CT image reconstructions and MIPs were obtained to evaluate the vascular anatomy. CONTRAST:  OMNIPAQUE IOHEXOL 350 MG/ML SOLN COMPARISON:  Chest x-ray 12/19/2020, CT chest 06/04/2020 FINDINGS: Cardiovascular: Satisfactory opacification of the pulmonary arteries to the segmental level. No evidence of pulmonary embolism. Nonaneurysmal aorta. No dissection is seen. Coronary vascular calcification with stent. Cardiomegaly. No pericardial effusion. Mediastinum/Nodes: No enlarged mediastinal, hilar, or axillary lymph nodes. Thyroid gland, trachea, and esophagus demonstrate no significant findings. Lungs/Pleura: Lungs are clear. No pleural effusion or pneumothorax. Upper Abdomen: No acute abnormality. Musculoskeletal: No chest wall abnormality. No acute or significant osseous findings. Review of the MIP images confirms the above findings. IMPRESSION: 1. Negative for acute pulmonary embolus or aortic dissection. Clear lung fields. 2. Cardiomegaly. Electronically Signed   By: Jasmine Pang M.D.   On: 12/19/2020 23:03    ____________________________________________   PROCEDURES  Procedure(s) performed (including Critical Care): Critical care time 20 minutes this includes reviewing the patient's labs and the patient trying to calm him down and then reviewing his old records.  Procedures   ____________________________________________   INITIAL IMPRESSION /  ASSESSMENT AND PLAN / ED COURSE Patient having chest pain which is pleuritic and in his back with some shortness of breath.  There is no wheezing currently.  EKG is unchanged chest x-ray is unchanged however this time his troponin is quite elevated.  I have ordered some aspirin and heparin.  I will additionally get a CT  angio of the patient's chest since his pain is so pleuritic.  Anticipate admission 1 where the other whether the patient has no PE or NSTEMI or myocarditis.  We also have to get a coronavirus test.          Clinical Course as of 12/19/20 2313  Tue Dec 19, 2020  2239 DG Chest 2 View [RR]    Clinical Course User Index [RR] [place], [place], [place]     ____________________________________________   FINAL CLINICAL IMPRESSION(S) / ED DIAGNOSES  Final diagnoses:  Dyspnea, unspecified type  NSTEMI (non-ST elevated myocardial infarction) [facility])     ED Discharge Orders    None      *Please note:  AVIYON RINELLA was evaluated in Emergency Department on 12/19/2020 for the symptoms described in the history of present illness. He was evaluated in the context of the global COVID-19 pandemic, which necessitated consideration that the patient might be at risk for infection with the SARS-CoV-2 virus that causes COVID-19. Institutional protocols and algorithms that pertain to the evaluation of patients at risk for COVID-19 are in a state of rapid change based on information released by regulatory bodies including the CDC and federal and state organizations. These policies and algorithms were followed during the patient's care in the ED.  Some ED evaluations and interventions may be delayed as a result of limited staffing during and the pandemic.*   Note:  This document was prepared using Dragon voice recognition software and may include unintentional dictation errors.    Nena Polio, MD 12/19/20 [id number]    Nena Polio, MD 12/19/20 [phone]

## 2020-12-20 ENCOUNTER — Inpatient Hospital Stay (HOSPITAL_COMMUNITY)
Admit: 2020-12-20 | Discharge: 2020-12-20 | Disposition: A | Discharge status: Home / Self Care | Payer: Medicare HMO | Attending: Cardiology | Admitting: Cardiology

## 2020-12-20 DIAGNOSIS — F1410 Cocaine abuse, uncomplicated: Secondary | ICD-10-CM

## 2020-12-20 DIAGNOSIS — Z720 Tobacco use: Secondary | ICD-10-CM

## 2020-12-20 DIAGNOSIS — F1910 Other psychoactive substance abuse, uncomplicated: Secondary | ICD-10-CM

## 2020-12-20 DIAGNOSIS — R778 Other specified abnormalities of plasma proteins: Secondary | ICD-10-CM | POA: Insufficient documentation

## 2020-12-20 DIAGNOSIS — R079 Chest pain, unspecified: Secondary | ICD-10-CM | POA: Diagnosis not present

## 2020-12-20 DIAGNOSIS — M549 Dorsalgia, unspecified: Secondary | ICD-10-CM | POA: Insufficient documentation

## 2020-12-20 DIAGNOSIS — I2510 Atherosclerotic heart disease of native coronary artery without angina pectoris: Secondary | ICD-10-CM

## 2020-12-20 DIAGNOSIS — R7989 Other specified abnormal findings of blood chemistry: Secondary | ICD-10-CM | POA: Insufficient documentation

## 2020-12-20 DIAGNOSIS — R0789 Other chest pain: Secondary | ICD-10-CM

## 2020-12-20 DIAGNOSIS — I214 Non-ST elevation (NSTEMI) myocardial infarction: Secondary | ICD-10-CM

## 2020-12-20 LAB — ECHOCARDIOGRAM COMPLETE
AR max vel: 4.08 cm2
AV Area VTI: 4.16 cm2
AV Area mean vel: 4.09 cm2
AV Mean grad: 4 mmHg
AV Peak grad: 6.9 mmHg
Ao pk vel: 1.31 m/s
Area-P 1/2: 5.13 cm2
Calc EF: 61.5 %
Height: 65 in
S' Lateral: 4.6 cm
Single Plane A2C EF: 56 %
Single Plane A4C EF: 67.6 %
Weight: 3360 oz

## 2020-12-20 LAB — HEPARIN LEVEL (UNFRACTIONATED)
Heparin Unfractionated: 0.27 IU/mL — ABNORMAL LOW (ref 0.30–0.70)
Heparin Unfractionated: 0.18 IU/mL — ABNORMAL LOW (ref 0.30–0.70)

## 2020-12-20 LAB — PHOSPHORUS: Phosphorus: 2.7 mg/dL (ref 2.5–4.6)

## 2020-12-20 LAB — RESP PANEL BY RT-PCR (FLU A&B, COVID) ARPGX2
Influenza A by PCR: NEGATIVE
Influenza B by PCR: NEGATIVE
SARS Coronavirus 2 by RT PCR: NEGATIVE

## 2020-12-20 LAB — CBC
HCT: 36.3 % — ABNORMAL LOW (ref 39.0–52.0)
Hemoglobin: 13 g/dL (ref 13.0–17.0)
MCH: 36.8 pg — ABNORMAL HIGH (ref 26.0–34.0)
MCHC: 35.8 g/dL (ref 30.0–36.0)
MCV: 102.8 fL — ABNORMAL HIGH (ref 80.0–100.0)
Platelets: 190 10*3/uL (ref 150–400)
RBC: 3.53 MIL/uL — ABNORMAL LOW (ref 4.22–5.81)
RDW: 13.8 % (ref 11.5–15.5)
WBC: 7.5 10*3/uL (ref 4.0–10.5)
nRBC: 0 % (ref 0.0–0.2)

## 2020-12-20 LAB — URINE DRUG SCREEN, QUALITATIVE (ARMC ONLY)
Amphetamines, Ur Screen: NOT DETECTED
Barbiturates, Ur Screen: NOT DETECTED
Benzodiazepine, Ur Scrn: NOT DETECTED
Cannabinoid 50 Ng, Ur ~~LOC~~: NOT DETECTED
Cocaine Metabolite,Ur ~~LOC~~: POSITIVE — AB
MDMA (Ecstasy)Ur Screen: NOT DETECTED
Methadone Scn, Ur: NOT DETECTED
Opiate, Ur Screen: POSITIVE — AB
Phencyclidine (PCP) Ur S: NOT DETECTED
Tricyclic, Ur Screen: NOT DETECTED

## 2020-12-20 LAB — HIV ANTIBODY (ROUTINE TESTING W REFLEX): HIV Screen 4th Generation wRfx: NONREACTIVE

## 2020-12-20 LAB — MAGNESIUM: Magnesium: 1.9 mg/dL (ref 1.7–2.4)

## 2020-12-20 MED ORDER — HYDROCODONE-ACETAMINOPHEN 5-325 MG PO TABS
1.0000 | ORAL_TABLET | ORAL | Status: DC | PRN
  Administered 2020-12-20 – 2020-12-23 (×7): 1 via ORAL
  Filled 2020-12-20 (×8): qty 1

## 2020-12-20 MED ORDER — MORPHINE SULFATE (PF) 2 MG/ML IV SOLN
2.0000 mg | INTRAVENOUS | Status: DC | PRN
  Administered 2020-12-20 – 2020-12-21 (×12): 2 mg via INTRAVENOUS
  Filled 2020-12-20 (×12): qty 1

## 2020-12-20 MED ORDER — ISOSORBIDE MONONITRATE ER 30 MG PO TB24
30.0000 mg | ORAL_TABLET | Freq: Every day | ORAL | Status: DC
  Administered 2020-12-20 – 2020-12-23 (×4): 30 mg via ORAL
  Filled 2020-12-20 (×4): qty 1

## 2020-12-20 MED ORDER — ENOXAPARIN SODIUM 60 MG/0.6ML ~~LOC~~ SOLN
50.0000 mg | SUBCUTANEOUS | Status: DC
  Filled 2020-12-20: qty 0.6

## 2020-12-20 MED ORDER — ENOXAPARIN SODIUM 40 MG/0.4ML ~~LOC~~ SOLN: 40.0000 mg | SUBCUTANEOUS | Status: DC

## 2020-12-20 MED ORDER — PERFLUTREN LIPID MICROSPHERE
1.0000 mL | INTRAVENOUS | Status: AC | PRN
  Administered 2020-12-20: 2 mL via INTRAVENOUS
  Filled 2020-12-20: qty 10

## 2020-12-20 NOTE — Progress Notes (Signed)
ANTICOAGULATION CONSULT NOTE -  Pharmacy Consult for Heparin  Indication: chest pain/ACS  Allergies  Allergen Reactions  . Penicillins Hives    Patient Measurements: Height: 5\' 5"  (165.1 cm) Weight: 95.3 kg (210 lb) IBW/kg (Calculated) : 61.5 Heparin Dosing Weight: 82.4 kg   Vital Signs: Temp: 98.4 F (36.9 C) (01/04 2056) Temp Source: Oral (01/04 2056) BP: 103/58 (01/05 0506) Pulse Rate: 104 (01/05 0506)  Labs: Recent Labs    12/19/20 2114 12/19/20 2242 12/20/20 0415  HGB 13.9  --  13.0  HCT 38.8*  --  36.3*  PLT 240  --  190  APTT  --  28  --   LABPROT  --  11.5  --   INR  --  0.9  --   HEPARINUNFRC  --   --  0.27*  CREATININE 0.59*  --   --   TROPONINIHS 346* 347*  --     Estimated Creatinine Clearance: 119.8 mL/min (A) (by C-G formula based on SCr of 0.59 mg/dL (L)).   Medical History: Past Medical History:  Diagnosis Date  . CAD (coronary artery disease)    a. 01/2011 Anterior STEMI/Cath/PCI: LM nl, LAD 100d (3.5x7mm Vision BMS placed), LCX 54m, RI 50, RCA min irregs, EF 40% w/ apical, inferoapical HK.  . Cardiac arrest - ventricular fibrillation    a. In setting of STEMI 01/2011  . Cocaine abuse ([facility])   . ETOH abuse    a. 6-12 beers/day  . Hemorrhoids   . Hypertension   . Ischemic cardiomyopathy    a. 06/2011 Echo: EF 45-50%, No rwma  . Marijuana abuse   . Tobacco abuse    a. 1/2 ppd x 26 yrs    Medications:  (Not in a hospital admission)   Assessment: Pharmacy consulted to dose heparin in this 50 year old male admitted with ACS/NSTEMI.  No prior anticoag noted.  CrCl = 119.8 ml/min   Goal of Therapy:  Heparin level 0.3-0.7 units/ml Monitor platelets by anticoagulation protocol: Yes   Plan:  Give 4000 units bolus x 1 Start heparin infusion at 1000 units/hr Check anti-Xa level in 6 hours and daily while on heparin Continue to monitor H&H and platelets   0105 0415 HL 0.27, SUBtherapeutic.  Will increase Heparin drip to 1250 units/hr  and recheck HL in 6 hours.  Monitor CBC.  52, Alvie Speltz A 12/20/2020,5:52 AM

## 2020-12-20 NOTE — H&P (Addendum)
History and Physical    WAITMAN Wells [id number] DOB: 12-24-1970 DOA: 12/19/2020  PCP: [facility]   Patient coming from: Home  I have personally briefly reviewed patient's old medical records in [place]  Chief Complaint: Chest pain  HPI: Ethan Wells is a 50 y.o. male with medical history significant for hypertension, polysubstance abuse including cocaine, alcohol abuse, CAD, history of STEMI, complicated by cardiac arrest due to V. fib, systolic heart failure with EF 45-50 percent who presents to the emergency room with chest pain.  Patient was seen in the emergency room on 12/25 for chest pain and was ruled out for ACS with 3 - troponins.  On his return today, he complains of constant sharp mid back pain chest pain of severe intensity radiating to the low left back and the left leg. Waxing and waning in intensity.  Different from his last heart attack and similar to pain that brought him to the ER on 12/25.  He denies associated nausea, vomiting, diaphoresis, lightheadedness or palpitations.  Denies  shortness of breath.  Denies cough, fever and chills. Admits to ongoing use of cocaine. ED Course: On arrival, tachycardic at 103 with BP 142/88 and otherwise normal vitals.  Blood work significant for troponin of 346>>347, but otherwise unremarkable.  Troponins with 10 and 11 on 12/25 EKG as reviewed by me : Sinus at 98 with no acute ST-T wave changes Imaging: CTA chest showed cardiomegaly.  Negative for acute PE and aortic dissection  Patient given aspirin, nitroglycerin with improvement in chest pain.  He was started on a heparin infusion.  Hospitalist consulted for admission.  Review of Systems: As per HPI otherwise all other systems on review of systems negative.    Past Medical History:  Diagnosis Date  . CAD (coronary artery disease)    a. 01/2011 Anterior STEMI/Cath/PCI: LM nl, LAD 100d (3.5x48mm Vision BMS placed), LCX 11m, RI 50, RCA min  irregs, EF 40% w/ apical, inferoapical HK.  . Cardiac arrest - ventricular fibrillation    a. In setting of STEMI 01/2011  . Cocaine abuse ([place])   . ETOH abuse    a. 6-12 beers/day  . Hemorrhoids   . Hypertension   . Ischemic cardiomyopathy    a. 06/2011 Echo: EF 45-50%, No rwma  . Marijuana abuse   . Tobacco abuse    a. 1/2 ppd x 26 yrs    Past Surgical History:  Procedure Laterality Date  . CORONARY ANGIOPLASTY WITH STENT PLACEMENT       reports that he has been smoking cigarettes. He has a 13.00 pack-year smoking history. He has never used smokeless tobacco. He reports current alcohol use. He reports previous drug use. Drugs: Cocaine and Marijuana.  Allergies  Allergen Reactions  . Penicillins Hives    Family History  Problem Relation Age of Onset  . Coronary artery disease Mother        alive  . Other Other        2 sisters alive and well  . Alcohol abuse Father        died @ 72      Prior to Admission medications   Medication Sig Start Date End Date Taking? Authorizing Provider  albuterol (VENTOLIN HFA) 108 (90 Base) MCG/ACT inhaler Inhale 2 puffs into the lungs every 4 (four) hours as needed for wheezing or shortness of breath. 12/09/20   Paulette Blanch, MD  aspirin 81 MG chewable tablet Chew 1 tablet (81 mg  total) by mouth daily. 06/07/20   Fritzi Mandes, MD  atorvastatin (LIPITOR) 40 MG tablet Take 1 tablet (40 mg total) by mouth daily. 06/06/20   Fritzi Mandes, MD  folic acid (FOLVITE) 1 MG tablet Take 1 tablet (1 mg total) by mouth daily. 06/07/20   Fritzi Mandes, MD  lisinopril (ZESTRIL) 5 MG tablet Take 1 tablet (5 mg total) by mouth daily. 06/07/20   Fritzi Mandes, MD  Multiple Vitamin (MULTIVITAMIN WITH MINERALS) TABS tablet Take 1 tablet by mouth daily. 06/07/20   Fritzi Mandes, MD  nitroGLYCERIN (NITROSTAT) 0.4 MG SL tablet Place 1 tablet (0.4 mg total) under the tongue every 5 (five) minutes as needed for chest pain. 06/06/20   Fritzi Mandes, MD  pantoprazole (PROTONIX) 40  MG tablet Take 1 tablet (40 mg total) by mouth daily at 12 noon. 06/06/20   Fritzi Mandes, MD  predniSONE (DELTASONE) 20 MG tablet 3 tablets daily x 4 days 12/09/20   Paulette Blanch, MD  thiamine 100 MG tablet Take 1 tablet (100 mg total) by mouth daily. 06/07/20   Fritzi Mandes, MD    Physical Exam: Vitals:   12/19/20 2049 12/19/20 2056 12/19/20 2108 12/19/20 2308  BP:  (!) 142/88  134/87  Pulse:  (!) 103  (!) 104  Resp:  20  19  Temp:  98.4 F (36.9 C)    TempSrc:  Oral    SpO2: 98% 97%  96%  Weight:   95.3 kg   Height:   5\' 5"  (1.651 m)      Vitals:   12/19/20 2049 12/19/20 2056 12/19/20 2108 12/19/20 2308  BP:  (!) 142/88  134/87  Pulse:  (!) 103  (!) 104  Resp:  20  19  Temp:  98.4 F (36.9 C)    TempSrc:  Oral    SpO2: 98% 97%  96%  Weight:   95.3 kg   Height:   5\' 5"  (1.651 m)       Constitutional: Alert and oriented x 3 . Not in any apparent distress HEENT:      Head: Normocephalic and atraumatic.         Eyes: PERLA, EOMI, Conjunctivae are normal. Sclera is non-icteric.       Mouth/Throat: Mucous membranes are moist.       Neck: Supple with no signs of meningismus. Cardiovascular: Regular rate and rhythm. No murmurs, gallops, or rubs. 2+ symmetrical distal pulses are present . No JVD. No LE edema Respiratory: Respiratory effort normal .Lungs sounds clear bilaterally. No wheezes, crackles, or rhonchi.  Gastrointestinal: Soft, non tender, and non distended with positive bowel sounds.  Genitourinary: No CVA tenderness. Musculoskeletal: Nontender with normal range of motion in all extremities. No cyanosis, or erythema of extremities. Neurologic:  Face is symmetric. Moving all extremities. No gross focal neurologic deficits . Skin: Skin is warm, dry.  No rash or ulcers Psychiatric: Mood and affect are normal    Labs on Admission: I have personally reviewed following labs and imaging studies  CBC: Recent Labs  Lab 12/19/20 2114  WBC 11.6*  NEUTROABS 6.3  HGB 13.9   HCT 38.8*  MCV 101.6*  PLT [id number]   Basic Metabolic Panel: Recent Labs  Lab 12/19/20 2114  NA 135  K 3.8  CL 99  CO2 23  GLUCOSE 114*  BUN 5*  CREATININE 0.59*  CALCIUM 9.0   GFR: Estimated Creatinine Clearance: 119.8 mL/min (A) (by C-G formula based on SCr of 0.59 mg/dL (L)). Liver Function  Tests: Recent Labs  Lab 12/19/20 2114  AST 41  ALT 35  ALKPHOS 114  BILITOT 0.7  PROT 8.2*  ALBUMIN 4.4   No results for input(s): LIPASE, AMYLASE in the last 168 hours. No results for input(s): AMMONIA in the last 168 hours. Coagulation Profile: Recent Labs  Lab 12/19/20 2242  INR 0.9   Cardiac Enzymes: No results for input(s): CKTOTAL, CKMB, CKMBINDEX, TROPONINI in the last 168 hours. BNP (last 3 results) No results for input(s): PROBNP in the last 8760 hours. HbA1C: No results for input(s): HGBA1C in the last 72 hours. CBG: No results for input(s): GLUCAP in the last 168 hours. Lipid Profile: No results for input(s): CHOL, HDL, LDLCALC, TRIG, CHOLHDL, LDLDIRECT in the last 72 hours. Thyroid Function Tests: No results for input(s): TSH, T4TOTAL, FREET4, T3FREE, THYROIDAB in the last 72 hours. Anemia Panel: No results for input(s): VITAMINB12, FOLATE, FERRITIN, TIBC, IRON, RETICCTPCT in the last 72 hours. Urine analysis:    Component Value Date/Time   COLORURINE YELLOW (A) 06/04/2020 1149   APPEARANCEUR CLEAR (A) 06/04/2020 1149   APPEARANCEUR Clear 03/16/2012 1315   LABSPEC 1.002 (L) 06/04/2020 1149   LABSPEC 1.013 03/16/2012 1315   PHURINE 7.0 06/04/2020 1149   GLUCOSEU NEGATIVE 06/04/2020 1149   GLUCOSEU 50 mg/dL 09/38/1829 9371   HGBUR NEGATIVE 06/04/2020 1149   BILIRUBINUR NEGATIVE 06/04/2020 1149   BILIRUBINUR Negative 03/16/2012 1315   KETONESUR NEGATIVE 06/04/2020 1149   PROTEINUR NEGATIVE 06/04/2020 1149   NITRITE NEGATIVE 06/04/2020 1149   LEUKOCYTESUR NEGATIVE 06/04/2020 1149   LEUKOCYTESUR Negative 03/16/2012 1315    Radiological Exams on  Admission: DG Chest 2 View  Result Date: 12/19/2020 CLINICAL DATA:  50 year old male with chest pain and shortness of breath. EXAM: CHEST - 2 VIEW COMPARISON:  Chest radiograph dated 12/09/2020. FINDINGS: No focal consolidation, pleural effusion or pneumothorax. The cardiac silhouette is within limits. No acute osseous pathology. IMPRESSION: No active cardiopulmonary disease. Electronically Signed   By: Elgie Collard M.D.   On: 12/19/2020 21:26   CT Angio Chest PE W and/or Wo Contrast  Result Date: 12/19/2020 CLINICAL DATA:  Chest pain and short of breath EXAM: CT ANGIOGRAPHY CHEST WITH CONTRAST TECHNIQUE: Multidetector CT imaging of the chest was performed using the standard protocol during bolus administration of intravenous contrast. Multiplanar CT image reconstructions and MIPs were obtained to evaluate the vascular anatomy. CONTRAST:  OMNIPAQUE IOHEXOL 350 MG/ML SOLN COMPARISON:  Chest x-ray 12/19/2020, CT chest 06/04/2020 FINDINGS: Cardiovascular: Satisfactory opacification of the pulmonary arteries to the segmental level. No evidence of pulmonary embolism. Nonaneurysmal aorta. No dissection is seen. Coronary vascular calcification with stent. Cardiomegaly. No pericardial effusion. Mediastinum/Nodes: No enlarged mediastinal, hilar, or axillary lymph nodes. Thyroid gland, trachea, and esophagus demonstrate no significant findings. Lungs/Pleura: Lungs are clear. No pleural effusion or pneumothorax. Upper Abdomen: No acute abnormality. Musculoskeletal: No chest wall abnormality. No acute or significant osseous findings. Review of the MIP images confirms the above findings. IMPRESSION: 1. Negative for acute pulmonary embolus or aortic dissection. Clear lung fields. 2. Cardiomegaly. Electronically Signed   By: Jasmine Pang M.D.   On: 12/19/2020 23:03     Assessment/Plan 50 year old male with history of hypertension, polysubstance abuse including cocaine, alcohol abuse, CAD, history of STEMI,  complicated by cardiac arrest due to V. fib, systolic heart failure with EF 45-50 percent who presents to the emergency room with chest pain    NSTEMI (non-ST elevated myocardial infarction) (HCC)   History of ST elevation myocardial infarction (  STEMI)   CAD, NATIVE VESSEL -Patient with atypical chest pain, radiating to left lower back but with elevated troponin of 347 with flat trend.  No acute ST-T wave changes -Continue heparin infusion -No beta-blockers due to cocaine use.  Follow-up UDS -Continue aspirin, atorvastatin nitroglycerin sublingual as needed chest pain with morphine for breakthrough -Cardiology consult  Possible lumbar radiculopathy -Patient with left-sided low back pain radiating down the leg -Pain management -If NSTEMI ruled out, consider treatment with NSAIDs especially in view of history of substance abuse.    Polysubstance abuse ([place]) -Follow-up UDS.  Patient admits to ongoing cocaine use.    Chronic systolic CHF (congestive heart failure) (HCC) Ischemic cardiomyopathy -Last echo 07/05/2019 showed EF 45 to 50% -Appears euvolemic -No beta-blockers.  Continue ACE inhibitor pending med rec    Alcohol use disorder, moderate, dependence ([place]) -CIWA withdrawal protocol     DVT prophylaxis: Full dose heparin code Status: full code  Family Communication:  none  Disposition Plan: Back to previous home environment Consults called: none  Status:At the time of admission, it appears that the appropriate admission status for this patient is INPATIENT. This is judged to be reasonable and necessary in order to provide the required intensity of service to ensure the patient's safety given the presenting symptoms, physical exam findings, and initial radiographic and laboratory data in the context of their  Comorbid conditions.   Patient requires inpatient status due to high intensity of service, high risk for further deterioration and high frequency of surveillance required.   I  certify that at the point of admission it is my clinical judgment that the patient will require inpatient hospital care spanning beyond [place] MD [facility]     12/20/2020, 12:01 AM

## 2020-12-20 NOTE — Consult Note (Signed)
Cardiology Consultation:   Patient ID: Ethan Wells MRN: [id number]; DOB: 1971/03/02  Admit date: 12/19/2020 Date of Consult: 12/20/2020  Primary Care Provider: [place] Cardiologist: Lauree Chandler, MD  [place] Electrophysiologist:  None    [name] is a 50 y.o. male with a hx of CAD, ischemic cardiomyopathy, acute anterior wall STEMI in 2012 s/p BMS to LAD, ongoing and current tobacco/cocaine/alcohol/ marijuana abuse, h/o medication noncompliance, h/o depression, and who is being seen today for the evaluation of chest pain at the request of Dr. Damita Dunnings.  History of Present Illness:   Ethan Wells is a 50 yo male with PMH as above.  He was admitted to [facility] 02/06/2011 with acute anterior wall STEMI.  His LAD was occluded and bare-metal stent placed.  EF 25 to 30%.    Admitted 06/2019 and 05/2020 with atypical CP s/p using cocaine and EtOH.  EF 45-50%. Reported medication noncompliance and that EtOH relieved his CP. Noted tobacco use at 1/2 pack daily, 6-8 beers daily, and ongoing cocaine use.  He also noted amphetamine use on 05/2020. Medical management advised, as well as cessation of drug use. He was seen 12/25 for CP.   Today, 12/21/19, we are consulted for atypical CP.  During consultation today, however, the patient denies any recent or current chest pain.  He reports ongoing back pain since Christmas day.  Back pain is both upper and lower back pain with associated left hip pain and left leg numbness, which she reports is an ongoing issue for him.  He reportedly experienced a fall after that time, during which time he fell on his back; however, he states that his back pain started before his fall.  He reports that his fall was a mechanical fall and due to tripping over something.  He did not hit his head and landed on a pile of clothing.    He presented to [facility] 12/19/2020 because he woke up that  morning with diaphoresis, weakness, dizziness, nausea without emesis, racing heart rate, palpitations, ongoing back pain, and shortness of breath.  He used his inhaler with some improvement in the shortness of breath.  No chest pain.  No loss of consciousness.  No diarrhea or constipation.  He did note BRBPR and bright red blood in the toilet today and states that this is an ongoing finding.  At the time of cardiac consultation, he reports resolution of all symptoms excluding weakness and fatigue.  He reports poor sleep over the last few days, which he feels is the reason for his fatigue.  He reportedly talked to a friend, which told him that he may be having a heart attack, which is the reason he came to the ED.  He states he has never had sx like this before. He notes the symptoms are not at all similar to his previous symptoms before his STEMI in 2012, however.  He reports ongoing cocaine use, though states it is intermittent and occurs once every 7 months.  He reports ongoing tobacco and alcohol use.  He drank 2 beers before presenting to [facility] today.  He reports medication compliance with only "the medication for his BP." In the ED, initial vitals significant for tachycardia and hypertension with HR 103 bpm and BP 142/88.  Urine drug screen positive for cocaine and opiates.  He reports the opiates are due to receiving opiates in the emergency department.  He states that he has not had  cocaine recently and is surprised that it appeared in his UDS.  Other labs significant for potassium 3.8, stable renal function with creatinine 0.59 and BUN 5.  Leukocytosis with WBC 11.6, hemoglobin 13.9, hematocrit 38.8, MCV 101.6.  Respiratory panel/COVID-19 negative.  Chest x-ray without acute findings. HS Tn 346  347. EKG without acute ST/T changes. CTA negative for pulmonary embolus or aortic dissection.   Past Medical History:  Diagnosis Date  . CAD (coronary artery disease)    a. 01/2011 Anterior STEMI/Cath/PCI: LM nl,  LAD 100d (3.5x20mm Vision BMS placed), LCX 27m, RI 50, RCA min irregs, EF 40% w/ apical, inferoapical HK.  . Cardiac arrest - ventricular fibrillation    a. In setting of STEMI 01/2011  . Cocaine abuse (HCC)   . ETOH abuse    a. 6-12 beers/day  . Hemorrhoids   . Hypertension   . Ischemic cardiomyopathy    a. 06/2011 Echo: EF 45-50%, No rwma  . Marijuana abuse   . Tobacco abuse    a. 1/2 ppd x 26 yrs    Past Surgical History:  Procedure Laterality Date  . CORONARY ANGIOPLASTY WITH STENT PLACEMENT       Home Medications:  Prior to Admission medications   Medication Sig Start Date End Date Taking? Authorizing Provider  albuterol (VENTOLIN HFA) 108 (90 Base) MCG/ACT inhaler Inhale 2 puffs into the lungs every 4 (four) hours as needed for wheezing or shortness of breath. 12/09/20  Yes Irean Hong, MD  aspirin 81 MG chewable tablet Chew 1 tablet (81 mg total) by mouth daily. 06/07/20  Yes Enedina Finner, MD  carvedilol (COREG) 6.25 MG tablet Take 6.25 mg by mouth 2 (two) times daily. 10/05/20  Yes [provider]  folic acid (FOLVITE) 1 MG tablet Take 1 tablet (1 mg total) by mouth daily. 06/07/20  Yes Enedina Finner, MD  gabapentin (NEURONTIN) 300 MG capsule Take 600 mg by mouth at bedtime. 11/29/20  Yes [provider]  isosorbide mononitrate (IMDUR) 30 MG 24 hr tablet Take 30 mg by mouth daily. 10/05/20  Yes [provider]  lisinopril (ZESTRIL) 5 MG tablet Take 1 tablet (5 mg total) by mouth daily. 06/07/20  Yes Enedina Finner, MD  Multiple Vitamin (MULTIVITAMIN WITH MINERALS) TABS tablet Take 1 tablet by mouth daily. 06/07/20  Yes Enedina Finner, MD  nitroGLYCERIN (NITROSTAT) 0.4 MG SL tablet Place 1 tablet (0.4 mg total) under the tongue every 5 (five) minutes as needed for chest pain. 06/06/20  Yes Enedina Finner, MD  Omega-3 Fatty Acids (FISH OIL) 1000 MG CAPS Take 2 capsules by mouth daily.   Yes [provider]  pantoprazole (PROTONIX) 40 MG tablet Take 1 tablet  (40 mg total) by mouth daily at 12 noon. 06/06/20  Yes Enedina Finner, MD  rosuvastatin (CRESTOR) 40 MG tablet Take 40 mg by mouth daily. 11/14/20  Yes [provider]  thiamine 100 MG tablet Take 1 tablet (100 mg total) by mouth daily. 06/07/20  Yes Enedina Finner, MD  vitamin E 45 MG (100 UNITS) capsule Take 200 Units by mouth daily.   Yes [provider]  atorvastatin (LIPITOR) 40 MG tablet Take 1 tablet (40 mg total) by mouth daily. Patient not taking: No sig reported 06/06/20   Enedina Finner, MD  predniSONE (DELTASONE) 20 MG tablet 3 tablets daily x 4 days Patient not taking: No sig reported 12/09/20   Irean Hong, MD    Inpatient Medications: Scheduled Meds: . aspirin  324 mg  Oral NOW   Or  . aspirin  300 mg Rectal NOW  . aspirin EC  81 mg Oral Daily  . atorvastatin  40 mg Oral q1800  . folic acid  1 mg Oral Daily  . isosorbide mononitrate  30 mg Oral Daily  . LORazepam  0-4 mg Intravenous Q6H   Followed by  . [START ON 12/22/2020] LORazepam  0-4 mg Intravenous Q12H  . multivitamin with minerals  1 tablet Oral Daily  . pantoprazole  40 mg Oral Q1200  . thiamine  100 mg Oral Daily   Or  . thiamine  100 mg Intravenous Daily   Continuous Infusions: . heparin 1,250 Units/hr (12/20/20 0554)   PRN Meds: acetaminophen, HYDROcodone-acetaminophen, LORazepam **OR** LORazepam, morphine injection, nitroGLYCERIN, nitroGLYCERIN, ondansetron (ZOFRAN) IV  Allergies:    Allergies  Allergen Reactions  . Penicillins Hives    Social History:   Social History   Socioeconomic History  . Marital status: Single    Spouse name: Not on file  . Number of children: Not on file  . Years of education: Not on file  . Highest education level: Not on file  Occupational History  . Not on file  Tobacco Use  . Smoking status: Current Every Day Smoker    Packs/day: 0.50    Years: 26.00    Pack years: 13.00    Types: Cigarettes  . Smokeless tobacco: Never Used  Vaping Use  . Vaping  Use: Never used  Substance and Sexual Activity  . Alcohol use: Yes    Comment: at least a 6 pack of beer daily and often a 12 pack  . Drug use: Not Currently    Types: Cocaine, Marijuana    Comment:  cocaine used several weeks ago; marijuana today  . Sexual activity: Not on file  Other Topics Concern  . Not on file  Social History Narrative   Single, lives in [place] with his mother.  He does not routinely exercise.  He sometimes works, helping to deliver appliances to homes.   Social Determinants of Health   Financial Resource Strain: Not on file  Food Insecurity: Not on file  Transportation Needs: Not on file  Physical Activity: Not on file  Stress: Not on file  Social Connections: Not on file  Intimate Partner Violence: Not on file    Family History:    Family History  Problem Relation Age of Onset  . Coronary artery disease Mother        alive  . Other Other        2 sisters alive and well  . Alcohol abuse Father        died @ 71     ROS:  Please see the history of present illness.  Review of Systems  Constitutional: Positive for diaphoresis and malaise/fatigue. Negative for fever.  Respiratory: Positive for shortness of breath. Negative for cough.   Cardiovascular: Positive for palpitations. Negative for chest pain, orthopnea, claudication, leg swelling and PND.  Gastrointestinal: Positive for blood in stool and nausea. Negative for constipation, diarrhea and vomiting.  Genitourinary: Positive for hematuria.  Musculoskeletal: Positive for back pain, falls and myalgias.  Neurological: Positive for dizziness and weakness. Negative for focal weakness and loss of consciousness.  Psychiatric/Behavioral: Positive for substance abuse. The patient is nervous/anxious and has insomnia.   All other systems reviewed and are negative.   All other ROS reviewed and negative.     Physical Exam/Data:   Vitals:   12/20/20 0800  12/20/20 0900 12/20/20 1000 12/20/20 1100  BP:  125/75 119/72 118/80 129/89  Pulse:   100 100  Resp: 15 (!) 25 16 (!) 25  Temp:      TempSrc:      SpO2:  96% 97% 97%  Weight:      Height:        Intake/Output Summary (Last 24 hours) at 12/20/2020 1139 Last data filed at 12/20/2020 1127 Gross per 24 hour  Intake -  Output 950 ml  Net -950 ml   Last 3 Weights 12/19/2020 12/09/2020 12/08/2020  Weight (lbs) 210 lb 200 lb 200 lb  Weight (kg) 95.255 kg 90.719 kg 90.719 kg     Body mass index is 34.95 kg/m.  General:  Well nourished, well developed, in no acute distress.  Watching television on the telephone  HEENT: normal Lymph: no adenopathy Neck: no JVD Endocrine:  No thryomegaly Vascular: No carotid bruits; FA pulses 2+ bilaterally without bruits  Cardiac:  normal S1, S2; tachycardic but regular, faint 1/6 systolic murmur LLSB  Lungs:  clear to auscultation bilaterally, no wheezing, rhonchi or rales  Abd: soft, nontender, no hepatomegaly  Ext: no edema Musculoskeletal:  No deformities, BUE and BLE strength normal and equal Skin: warm and dry  Neuro:  CNs 2-12 intact, no focal abnormalities noted Psych:  Normal affect, slightly anxious  EKG:  The EKG was personally reviewed and demonstrates:  NSR, 98 bpm, low voltage, TWI II, III, avF and nonspecific changes as seen on prior 12/27 EKG  Telemetry:  Telemetry was personally reviewed and demonstrates:  NSR-ST  Relevant CV Studies: Echo 07/12/20 1. Moderate hypokinesis of the left ventricular, entire anterior wall.  2. The left ventricle has mildly reduced systolic function, with an  ejection fraction of 45-50%. The cavity size was normal. Indeterminate  diastolic filling due to E-A fusion.  3. The aortic valve was not well visualized. Mild thickening of the  aortic valve. Mild calcification of the aortic valve.  4. The aorta is abnormal in size and structure.  5. There is mild dilatation of the aortic root measuring 37 mm.  6. The interatrial septum was not well  visualized.   Laboratory Data:  High Sensitivity Troponin:   Recent Labs  Lab 12/08/20 0325 12/09/20 0455 12/09/20 0641 12/19/20 2114 12/19/20 2242  TROPONINIHS 11 11 10  346* 347*     Chemistry Recent Labs  Lab 12/19/20 2114  NA 135  K 3.8  CL 99  CO2 23  GLUCOSE 114*  BUN 5*  CREATININE 0.59*  CALCIUM 9.0  GFRNONAA >60  ANIONGAP 13    Recent Labs  Lab 12/19/20 2114  PROT 8.2*  ALBUMIN 4.4  AST 41  ALT 35  ALKPHOS 114  BILITOT 0.7   Hematology Recent Labs  Lab 12/19/20 2114 12/20/20 0415  WBC 11.6* 7.5  RBC 3.82* 3.53*  HGB 13.9 13.0  HCT 38.8* 36.3*  MCV 101.6* 102.8*  MCH 36.4* 36.8*  MCHC 35.8 35.8  RDW 13.6 13.8  PLT 240 190   BNPNo results for input(s): BNP, PROBNP in the last 168 hours.  DDimer No results for input(s): DDIMER in the last 168 hours.   Radiology/Studies:  DG Chest 2 View  Result Date: 12/19/2020 CLINICAL DATA:  50 year old male with chest pain and shortness of breath. EXAM: CHEST - 2 VIEW COMPARISON:  Chest radiograph dated 12/09/2020. FINDINGS: No focal consolidation, pleural effusion or pneumothorax. The cardiac silhouette is within limits. No acute osseous pathology. IMPRESSION: No active  cardiopulmonary disease. Electronically Signed   By: Anner Crete M.D.   On: 12/19/2020 21:26   CT Angio Chest PE W and/or Wo Contrast  Result Date: 12/19/2020 CLINICAL DATA:  Chest pain and short of breath EXAM: CT ANGIOGRAPHY CHEST WITH CONTRAST TECHNIQUE: Multidetector CT imaging of the chest was performed using the standard protocol during bolus administration of intravenous contrast. Multiplanar CT image reconstructions and MIPs were obtained to evaluate the vascular anatomy. CONTRAST:  112mL OMNIPAQUE IOHEXOL 350 MG/ML SOLN COMPARISON:  Chest x-ray 12/19/2020, CT chest 06/04/2020 FINDINGS: Cardiovascular: Satisfactory opacification of the pulmonary arteries to the segmental level. No evidence of pulmonary embolism. Nonaneurysmal aorta.  No dissection is seen. Coronary vascular calcification with stent. Cardiomegaly. No pericardial effusion. Mediastinum/Nodes: No enlarged mediastinal, hilar, or axillary lymph nodes. Thyroid gland, trachea, and esophagus demonstrate no significant findings. Lungs/Pleura: Lungs are clear. No pleural effusion or pneumothorax. Upper Abdomen: No acute abnormality. Musculoskeletal: No chest wall abnormality. No acute or significant osseous findings. Review of the MIP images confirms the above findings. IMPRESSION: 1. Negative for acute pulmonary embolus or aortic dissection. Clear lung fields. 2. Cardiomegaly. Electronically Signed   By: Donavan Foil M.D.   On: 12/19/2020 23:03     Assessment and Plan:   Elevated HS Tn s/p recent cocaine use  Known history of CAD, ICM -No recent or current CP.  High-sensitivity troponin minimally elevated to 346 and flat trending at 347 in the setting of recent cocaine use with urine drug screen positive for both cocaine and opiates.  EKG without acute ST/T changes.  Suspect supply demand ischemia in the setting of polysubstance use, tachycardic rate, and elevated BP.  Known history of ICM/CAD with previous echo as above and per HPI. --Echo ordered and pending.  Previous echo as above with EF 45 to 50%.  Further recommendations pending echo to reassess EF, wall motion, and rule out acute structural changes.  If EF reduced, further recommendations at that time and with consideration of his ongoing polysubstance use; however, at this time, no plan for invasive ischemic workup. --Daily CBC, BMET. --Plan for continued medical management at this time.  Stressed importance of polysubstance use cessation and medication compliance.  Continue asa 81mg  daily with PPI. Daily CBC on ASA given report of BRBPR. In terms of BB and given recent cocaine use with UDS positive for cocaine, Coreg is a nonselective beta adrenergic receptor blocker and an alpha adrenergic receptor blocker and thus  recommended over other BB in the setting of recent cocaine to prevent unopposed alpha. Low-dose lisinopril may be restarted for BP support. SL nitro as needed for CP.    Recommend aggressive risk factor modification, including statin therapy.  HTN -Elevated BP at presentation and s/p cocaine use. As above, given cocaine use, recommend Coreg for optimal BP control as HR allows for GDMT. Consider also restarting on low dose lisinopril as BP and renal function allows. Remainder of medical management as above.  Cocaine, opiate, alcohol, tobacco use --UDS positive for cocaine and opiates.  Ongoing tobacco and alcohol use reported. --EtOH protocol inacted.  --Cessation advised with long discussion regarding risks and benefits of ongoing substance use and limitations of GDMT given cocaine use.  Patient indicated his understanding.  Medical noncompliance - Medical management stressed.   Reported BRBPR, melena --Defer work-up to IM.  Recommend PPI in the setting of ASA 81 mg daily.  Back pain --Per IM.  Reports a long history of back pain and left hip pain, as well  as left leg numbness due to his back pain.       TIMI Risk Score for Unstable Angina or Non-ST Elevation MI:   The patient's TIMI risk score is 4, which indicates a 20% risk of all cause mortality, new or recurrent myocardial infarction or need for urgent revascularization in the next 14 days.           For questions or updates, please contact [place] Please consult [email or web address] for contact info under    Signed, Arvil Chaco, PA-C  12/20/2020 11:39 AM

## 2020-12-20 NOTE — Progress Notes (Signed)
Anticoagulation monitoring(Lovenox):  49yo  M ordered Lovenox 40 mg Q24h  Filed Weights   12/19/20 2108  Weight: 95.3 kg (210 lb)   BMI 34.95   Lab Results  Component Value Date   CREATININE 0.59 (L) 12/19/2020   CREATININE 0.71 12/09/2020   CREATININE 0.66 12/08/2020   Estimated Creatinine Clearance: 119.8 mL/min (A) (by C-G formula based on SCr of 0.59 mg/dL (L)). Hemoglobin & Hematocrit     Component Value Date/Time   HGB 13.0 12/20/2020 0415   HGB 15.0 09/06/2013 1458   HCT 36.3 (L) 12/20/2020 0415   HCT 42.2 09/06/2013 1458     Per Protocol for Patient with estCrcl> 30 ml/min and BMI > 30, will transition to Lovenox 0.5mg /kg mg Q24h     Bari Mantis PharmD Clinical Pharmacist 12/20/2020

## 2020-12-20 NOTE — Progress Notes (Signed)
PROGRESS NOTE    FENNER PROBUS  [id number] DOB: 1971-02-19 DOA: 12/19/2020 PCP: [facility]   Brief Narrative:  SEM SORRELLS is a 50 y.o. male with medical history significant for hypertension, polysubstance abuse including cocaine, alcohol abuse, CAD, history of STEMI, complicated by cardiac arrest due to V. fib, systolic heart failure with EF 45-50 percent who presents to the emergency room with chest pain.  Patient was seen in the emergency room on 12/25 for chest pain and was ruled out for ACS with 3 - troponins.  On his return today, he complains of constant sharp mid back pain chest pain of severe intensity radiating to the low left back and the left leg. Waxing and waning in intensity.  Different from his last heart attack and similar to pain that brought him to the ER on 12/25.    ED Course: On arrival, tachycardic at 103 with BP 142/88 and otherwise normal vitals.  Blood work significant for troponin of 346>>347, but otherwise unremarkable.  Troponins with 10 and 11 on 12/25 EKG as reviewed by me : Sinus at 98 with no acute ST-T wave changes Imaging: CTA chest showed cardiomegaly.  Negative for acute PE and aortic dissection  Patient given aspirin, nitroglycerin with improvement in chest pain.  He was started on a heparin infusion.   Assessment & Plan:   NSTEMI: -Patient presented with atypical chest pain radiating to left lower back with elevated of troponin on 347.  EKG: No acute ST-T wave changes noted. -Consult cardiology-recommend to stop heparin infusion-advised to stop using cocaine, tobacco, restart Imdur -Echo with normal wall motion, normal ejection fraction, no indication for additional cardiac testing at this time.-Appreciate cardiology's assistance.  Upper back pain: -CTA chest negative for PE, aortic dissection.  Likely musculoskeletal in origin. -Continue as needed pain medications  Hypertension: Continue Imdur.  Monitor blood  pressure closely  History of coronary artery disease status post PCI to LAD: -Patient denies current chest pain.  Continue aspirin, statin, Imdur  Chronic systolic CHF: Reviewed echo from 07/13/2019 showed ejection fraction of 45 to 50%.  Patient appears euvolemic on exam.  Repeat echo from this admission-as per cardiology note showed normal ejection fraction and no motion wall abnormalities noted. -Continue aspirin, statin, Imdur.  Polysubstance abuse: Patient's UDS is positive for cocaine and opioids.  Advise cessation.  Tobacco abuse: Counseled about cessation  Alcohol abuse: -Patient drinks 6 packs of beer per day. -Started on CIWA protocol. -Continue multivitamin, thiamine and folic acid supplements.  Obesity with BMI of 34: -Diet modification/exercise and weight loss recommended.  DVT prophylaxis: Lovenox  code Status: Full code Family Communication:  None present at bedside.  Plan of care discussed with patient in length and he verbalized understanding and agreed with it. Disposition Plan: Home in 1 to 2 days  Consultants:   Cardiology  Procedures:   CTA chest  Echo  Antimicrobials:   None  Status is: Inpatient  Dispo: The patient is from: Home              Anticipated d/c is to: Home              Anticipated d/c date is: 1 day              Patient currently is not medically stable to d/c.         Subjective: Patient seen and examined in the ED.  Complaining of back pain and requested for pain medication.  He denies  chest pain, shortness of breath, palpitation or leg swelling.  He uses cocaine and last intake was 2 days ago.  Smokes cigarettes every day and drinks alcohol about 6packs of beer /day.  Objective: Vitals:   12/20/20 0800 12/20/20 0900 12/20/20 1000 12/20/20 1100  BP: 125/75 119/72 118/80 129/89  Pulse:   100 100  Resp: 15 (!) 25 16 (!) 25  Temp:      TempSrc:      SpO2:  96% 97% 97%  Weight:      Height:        Intake/Output  Summary (Last 24 hours) at 12/20/2020 1233 Last data filed at 12/20/2020 1127 Gross per 24 hour  Intake --  Output 950 ml  Net -950 ml   Filed Weights   12/19/20 2108  Weight: 95.3 kg    Examination:  General exam: Appears calm and comfortable, on room air, communicating well Respiratory system: Clear to auscultation. Respiratory effort normal. Cardiovascular system: S1 & S2 heard, RRR. No JVD, murmurs, rubs, gallops or clicks. No pedal edema. Gastrointestinal system: Abdomen is nondistended, soft and nontender. No organomegaly or masses felt. Normal bowel sounds heard. Central nervous system: Alert and oriented. No focal neurological deficits. Extremities: Symmetric 5 x 5 power. Skin: No rashes, lesions or ulcers Psychiatry: Judgement and insight appear normal. Mood & affect appropriate.    Data Reviewed: I have personally reviewed following labs and imaging studies  CBC: Recent Labs  Lab 12/19/20 2114 12/20/20 0415  WBC 11.6* 7.5  NEUTROABS 6.3  --   HGB 13.9 13.0  HCT 38.8* 36.3*  MCV 101.6* 102.8*  PLT 240 190   Basic Metabolic Panel: Recent Labs  Lab 12/19/20 2114 12/20/20 0415  NA 135  --   K 3.8  --   CL 99  --   CO2 23  --   GLUCOSE 114*  --   BUN 5*  --   CREATININE 0.59*  --   CALCIUM 9.0  --   MG  --  1.9  PHOS  --  2.7   GFR: Estimated Creatinine Clearance: 119.8 mL/min (A) (by C-G formula based on SCr of 0.59 mg/dL (L)). Liver Function Tests: Recent Labs  Lab 12/19/20 2114  AST 41  ALT 35  ALKPHOS 114  BILITOT 0.7  PROT 8.2*  ALBUMIN 4.4   No results for input(s): LIPASE, AMYLASE in the last 168 hours. No results for input(s): AMMONIA in the last 168 hours. Coagulation Profile: Recent Labs  Lab 12/19/20 2242  INR 0.9   Cardiac Enzymes: No results for input(s): CKTOTAL, CKMB, CKMBINDEX, TROPONINI in the last 168 hours. BNP (last 3 results) No results for input(s): PROBNP in the last 8760 hours. HbA1C: No results for input(s):  HGBA1C in the last 72 hours. CBG: No results for input(s): GLUCAP in the last 168 hours. Lipid Profile: No results for input(s): CHOL, HDL, LDLCALC, TRIG, CHOLHDL, LDLDIRECT in the last 72 hours. Thyroid Function Tests: No results for input(s): TSH, T4TOTAL, FREET4, T3FREE, THYROIDAB in the last 72 hours. Anemia Panel: No results for input(s): VITAMINB12, FOLATE, FERRITIN, TIBC, IRON, RETICCTPCT in the last 72 hours. Sepsis Labs: No results for input(s): PROCALCITON, LATICACIDVEN in the last 168 hours.  Recent Results (from the past 240 hour(s))  Resp Panel by RT-PCR (Flu A&B, Covid) Nasopharyngeal Swab     Status: None   Collection Time: 12/19/20 12:20 AM   Specimen: Nasopharyngeal Swab; Nasopharyngeal(NP) swabs in vial transport medium  Result Value Ref Range Status  SARS Coronavirus 2 by RT PCR NEGATIVE NEGATIVE Final    Comment: (NOTE) SARS-CoV-2 target nucleic acids are NOT DETECTED.  The SARS-CoV-2 RNA is generally detectable in upper respiratory specimens during the acute phase of infection. The lowest concentration of SARS-CoV-2 viral copies this assay can detect is 138 copies/mL. A negative result does not preclude SARS-Cov-2 infection and should not be used as the sole basis for treatment or other patient management decisions. A negative result may occur with  improper specimen collection/handling, submission of specimen other than nasopharyngeal swab, presence of viral mutation(s) within the areas targeted by this assay, and inadequate number of viral copies(<138 copies/mL). A negative result must be combined with clinical observations, patient history, and epidemiological information. The expected result is Negative.  Fact Sheet for Patients:  EntrepreneurPulse.com.au  Fact Sheet for Healthcare Providers:  IncredibleEmployment.be  This test is no t yet approved or cleared by the [place] FDA and  has been authorized for  detection and/or diagnosis of SARS-CoV-2 by FDA under an Emergency Use Authorization (EUA). This EUA will remain  in effect (meaning this test can be used) for the duration of the COVID-19 declaration under Section 564(b)(1) of the Act, 21 U.S.C.section 360bbb-3(b)(1), unless the authorization is terminated  or revoked sooner.       Influenza A by PCR NEGATIVE NEGATIVE Final   Influenza B by PCR NEGATIVE NEGATIVE Final    Comment: (NOTE) The Xpert Xpress SARS-CoV-2/FLU/RSV plus assay is intended as an aid in the diagnosis of influenza from Nasopharyngeal swab specimens and should not be used as a sole basis for treatment. Nasal washings and aspirates are unacceptable for Xpert Xpress SARS-CoV-2/FLU/RSV testing.  Fact Sheet for Patients: EntrepreneurPulse.com.au  Fact Sheet for Healthcare Providers: IncredibleEmployment.be  This test is not yet approved or cleared by the [place] FDA and has been authorized for detection and/or diagnosis of SARS-CoV-2 by FDA under an Emergency Use Authorization (EUA). This EUA will remain in effect (meaning this test can be used) for the duration of the COVID-19 declaration under Section 564(b)(1) of the Act, 21 U.S.C. section 360bbb-3(b)(1), unless the authorization is terminated or revoked.  Performed at [facility], [address], [place], [place] [address]       Radiology Studies: DG Chest 2 View  Result Date: 12/19/2020 CLINICAL DATA:  50 year old male with chest pain and shortness of breath. EXAM: CHEST - 2 VIEW COMPARISON:  Chest radiograph dated 12/09/2020. FINDINGS: No focal consolidation, pleural effusion or pneumothorax. The cardiac silhouette is within limits. No acute osseous pathology. IMPRESSION: No active cardiopulmonary disease. Electronically Signed   By: Anner Crete M.D.   On: 12/19/2020 21:26   CT Angio Chest PE W and/or Wo Contrast  Result Date: 12/19/2020 CLINICAL  DATA:  Chest pain and short of breath EXAM: CT ANGIOGRAPHY CHEST WITH CONTRAST TECHNIQUE: Multidetector CT imaging of the chest was performed using the standard protocol during bolus administration of intravenous contrast. Multiplanar CT image reconstructions and MIPs were obtained to evaluate the vascular anatomy. CONTRAST:  131mL OMNIPAQUE IOHEXOL 350 MG/ML SOLN COMPARISON:  Chest x-ray 12/19/2020, CT chest 06/04/2020 FINDINGS: Cardiovascular: Satisfactory opacification of the pulmonary arteries to the segmental level. No evidence of pulmonary embolism. Nonaneurysmal aorta. No dissection is seen. Coronary vascular calcification with stent. Cardiomegaly. No pericardial effusion. Mediastinum/Nodes: No enlarged mediastinal, hilar, or axillary lymph nodes. Thyroid gland, trachea, and esophagus demonstrate no significant findings. Lungs/Pleura: Lungs are clear. No pleural effusion or pneumothorax. Upper Abdomen: No acute abnormality. Musculoskeletal:  No chest wall abnormality. No acute or significant osseous findings. Review of the MIP images confirms the above findings. IMPRESSION: 1. Negative for acute pulmonary embolus or aortic dissection. Clear lung fields. 2. Cardiomegaly. Electronically Signed   By: Donavan Foil M.D.   On: 12/19/2020 23:03    Scheduled Meds: . aspirin  324 mg Oral NOW   Or  . aspirin  300 mg Rectal NOW  . aspirin EC  81 mg Oral Daily  . atorvastatin  40 mg Oral q1800  . folic acid  1 mg Oral Daily  . isosorbide mononitrate  30 mg Oral Daily  . LORazepam  0-4 mg Intravenous Q6H   Followed by  . [START ON 12/22/2020] LORazepam  0-4 mg Intravenous Q12H  . multivitamin with minerals  1 tablet Oral Daily  . pantoprazole  40 mg Oral Q1200  . thiamine  100 mg Oral Daily   Or  . thiamine  100 mg Intravenous Daily   Continuous Infusions:   LOS: 1 day   Time spent: 40 minutes.   Mckinley Jewel, MD [facility]  If 7PM-7AM, please contact  night-coverage [email or web address] 12/20/2020, 12:33 PM

## 2020-12-20 NOTE — Progress Notes (Signed)
*  PRELIMINARY RESULTS* Echocardiogram 2D Echocardiogram has been performed.  Joanette Gula Margean Korell 12/20/2020, 11:53 AM

## 2020-12-20 NOTE — ED Notes (Signed)
Pt resting quietly and appears asleep at this time with normal rise and fall of chest.. NAD noted. Call bell in reach.

## 2020-12-20 NOTE — ED Notes (Signed)
No response from receiving nurse regarding message sent re transfer to floor. Attempted to reach [place], RN via telephone, was unsuccesful

## 2020-12-21 ENCOUNTER — Other Ambulatory Visit: Payer: Self-pay

## 2020-12-21 LAB — BASIC METABOLIC PANEL
Anion gap: 9 (ref 5–15)
BUN: 6 mg/dL (ref 6–20)
CO2: 27 mmol/L (ref 22–32)
Calcium: 9.2 mg/dL (ref 8.9–10.3)
Chloride: 102 mmol/L (ref 98–111)
Creatinine, Ser: 0.68 mg/dL (ref 0.61–1.24)
GFR, Estimated: >60 mL/min (ref >60)
Glucose, Bld: 105 mg/dL — ABNORMAL HIGH (ref 70–99)
Potassium: 3.8 mmol/L (ref 3.5–5.1)
Sodium: 138 mmol/L (ref 135–145)

## 2020-12-21 LAB — CBC
HCT: 36 % — ABNORMAL LOW (ref 39.0–52.0)
Hemoglobin: 12.2 g/dL — ABNORMAL LOW (ref 13.0–17.0)
MCH: 36.5 pg — ABNORMAL HIGH (ref 26.0–34.0)
MCHC: 33.9 g/dL (ref 30.0–36.0)
MCV: 107.8 fL — ABNORMAL HIGH (ref 80.0–100.0)
Platelets: 170 10*3/uL (ref 150–400)
RBC: 3.34 MIL/uL — ABNORMAL LOW (ref 4.22–5.81)
RDW: 13.5 % (ref 11.5–15.5)
WBC: 6.1 10*3/uL (ref 4.0–10.5)
nRBC: 0 % (ref 0.0–0.2)

## 2020-12-21 MED ORDER — MORPHINE SULFATE (PF) 4 MG/ML IV SOLN
2.0000 mg | INTRAVENOUS | Status: DC | PRN
  Administered 2020-12-21 – 2020-12-23 (×18): 2 mg via INTRAVENOUS
  Filled 2020-12-21 (×18): qty 1

## 2020-12-21 MED ORDER — ENOXAPARIN SODIUM 60 MG/0.6ML ~~LOC~~ SOLN
0.5000 mg/kg | SUBCUTANEOUS | Status: DC
  Administered 2020-12-21 – 2020-12-22 (×2): 45 mg via SUBCUTANEOUS
  Filled 2020-12-21 (×3): qty 0.6

## 2020-12-21 MED ORDER — TRAMADOL HCL 50 MG PO TABS: 50.0000 mg | ORAL_TABLET | Freq: Four times a day (QID) | ORAL | Status: DC | PRN

## 2020-12-21 MED ORDER — ROSUVASTATIN CALCIUM 10 MG PO TABS
40.0000 mg | ORAL_TABLET | Freq: Every day | ORAL | Status: DC
  Administered 2020-12-21 – 2020-12-22 (×2): 40 mg via ORAL
  Filled 2020-12-21 (×2): qty 4

## 2020-12-21 MED ORDER — LISINOPRIL 5 MG PO TABS
2.5000 mg | ORAL_TABLET | Freq: Every day | ORAL | Status: DC
  Administered 2020-12-21 – 2020-12-23 (×3): 2.5 mg via ORAL
  Filled 2020-12-21 (×3): qty 1

## 2020-12-21 NOTE — Progress Notes (Signed)
PROGRESS NOTE    Ethan Wells  [id number] DOB: Dec 25, 1970 DOA: 12/19/2020 PCP: [facility]   Brief Narrative:  Ethan Wells is a 50 y.o. male with medical history significant for hypertension, polysubstance abuse including cocaine, alcohol abuse, CAD, history of STEMI, complicated by cardiac arrest due to V. fib, systolic heart failure with EF 45-50 percent who presents to the emergency room with chest pain.  Patient was seen in the emergency room on 12/25 for chest pain and was ruled out for ACS with 3 - troponins.  On his return today, he complains of constant sharp mid back pain chest pain of severe intensity radiating to the low left back and the left leg. Waxing and waning in intensity.  Different from his last heart attack and similar to pain that brought him to the ER on 12/25.    ED Course: On arrival, tachycardic at 103 with BP 142/88 and otherwise normal vitals.  Blood work significant for troponin of 346>>347, but otherwise unremarkable.  Troponins with 10 and 11 on 12/25 EKG as reviewed by me : Sinus at 98 with no acute ST-T wave changes Imaging: CTA chest showed cardiomegaly.  Negative for acute PE and aortic dissection  Patient given aspirin, nitroglycerin with improvement in chest pain.  He was started on a heparin infusion.   Assessment & Plan:   Elevated troponin: -Patient presented with atypical chest pain radiating to left lower back with elevated of troponin on 347.  EKG: No acute ST-T wave changes noted.  Likely in the setting of cocaine abuse. -Consult cardiology-recommend to stop heparin infusion-advised to stop using cocaine, tobacco, restart Imdur -Echo with normal wall motion, normal ejection fraction, no indication for additional cardiac testing at this time.-Cardiology signed off.Marland Kitchen  Upper back pain: -CTA chest negative for PE, aortic dissection.  Likely musculoskeletal in origin. -Continue as needed pain  medications  Hypertension: Blood pressure is stable.  Continue Imdur.  Monitor blood pressure closely  History of coronary artery disease status post PCI to LAD: -Patient denies current chest pain.  Continue aspirin, statin, Imdur  Chronic systolic CHF: Reviewed echo from 07/13/2019 showed ejection fraction of 45 to 50%.  Patient appears euvolemic on exam.   -Repeat echo shows ejection fraction of 55 to 60%, no wall motion abnormalities. -Continue aspirin, statin, Imdur.  Polysubstance abuse: Patient's UDS is positive for cocaine and opioids.  Advise cessation.  Tobacco abuse: Counseled about cessation  Alcohol abuse: -Patient drinks 6 packs of beer per day. -Continue CIWA protocol. -Continue multivitamin, thiamine and folic acid supplements.  Obesity with BMI of 34: -Diet modification/exercise and weight loss recommended.  History of hemorrhoids: Reports that he notice bright blood in the stool couple of days ago as he has history of hemorrhoids.  H&H is stable.  Continue to monitor.  Denies melena, epigastric pain, weight loss.  DVT prophylaxis: Lovenox  code Status: Full code Family Communication:  None present at bedside.  Plan of care discussed with patient in length and he verbalized understanding and agreed with it. Disposition Plan: Home in 1 to 2 days  Consultants:   Cardiology  Procedures:   CTA chest  Echo  Antimicrobials:   None  Status is: Inpatient  Dispo: The patient is from: Home              Anticipated d/c is to: Home              Anticipated d/c date is: 1 day  Patient currently is not medically stable to d/c.    Subjective: Patient seen and examined.  Tells me that he feels weak overall and not comfortable going home today.  Continues to have back pain.  Denies chest pain, shortness of breath, palpitation, orthopnea or PND.  Remained afebrile.  No acute events overnight.  Has history of hemorrhoids.  He noticed blood in his stool  couple of days ago.  No epigastric pain, over-the-counter use of NSAIDs, weight loss, melena.  Objective: Vitals:   12/21/20 0950 12/21/20 0955 12/21/20 1000 12/21/20 1005  BP:    130/73  Pulse:      Resp: 14 15 (!) 7 14  Temp:      TempSrc:      SpO2:      Weight:      Height:        Intake/Output Summary (Last 24 hours) at 12/21/2020 1154 Last data filed at 12/21/2020 1010 Gross per 24 hour  Intake 360 ml  Output 3800 ml  Net -3440 ml   Filed Weights   12/19/20 2108 12/21/20 0525  Weight: 95.3 kg 90.4 kg    Examination:  General exam: Appears calm and comfortable, on room air, communicating well Respiratory system: Clear to auscultation. Respiratory effort normal. Cardiovascular system: S1 & S2 heard, RRR. No JVD, murmurs, rubs, gallops or clicks. No pedal edema. Gastrointestinal system: Abdomen is nondistended, soft and nontender. No organomegaly or masses felt. Normal bowel sounds heard. Central nervous system: Alert and oriented. No focal neurological deficits. Extremities: Symmetric 5 x 5 power. Skin: No rashes, lesions or ulcers Psychiatry: Judgement and insight appear normal. Mood & affect appropriate.    Data Reviewed: I have personally reviewed following labs and imaging studies  CBC: Recent Labs  Lab 12/19/20 2114 12/20/20 0415 12/21/20 0503  WBC 11.6* 7.5 6.1  NEUTROABS 6.3  --   --   HGB 13.9 13.0 12.2*  HCT 38.8* 36.3* 36.0*  MCV 101.6* 102.8* 107.8*  PLT 240 190 [id number]   Basic Metabolic Panel: Recent Labs  Lab 12/19/20 2114 12/20/20 0415 12/21/20 0503  NA 135  --  138  K 3.8  --  3.8  CL 99  --  102  CO2 23  --  27  GLUCOSE 114*  --  105*  BUN 5*  --  6  CREATININE 0.59*  --  0.68  CALCIUM 9.0  --  9.2  MG  --  1.9  --   PHOS  --  2.7  --    GFR: Estimated Creatinine Clearance: 116.8 mL/min (by C-G formula based on SCr of 0.68 mg/dL). Liver Function Tests: Recent Labs  Lab 12/19/20 2114  AST 41  ALT 35  ALKPHOS 114  BILITOT 0.7   PROT 8.2*  ALBUMIN 4.4   No results for input(s): LIPASE, AMYLASE in the last 168 hours. No results for input(s): AMMONIA in the last 168 hours. Coagulation Profile: Recent Labs  Lab 12/19/20 2242  INR 0.9   Cardiac Enzymes: No results for input(s): CKTOTAL, CKMB, CKMBINDEX, TROPONINI in the last 168 hours. BNP (last 3 results) No results for input(s): PROBNP in the last 8760 hours. HbA1C: No results for input(s): HGBA1C in the last 72 hours. CBG: No results for input(s): GLUCAP in the last 168 hours. Lipid Profile: No results for input(s): CHOL, HDL, LDLCALC, TRIG, CHOLHDL, LDLDIRECT in the last 72 hours. Thyroid Function Tests: No results for input(s): TSH, T4TOTAL, FREET4, T3FREE, THYROIDAB in the last 72 hours. Anemia  Panel: No results for input(s): VITAMINB12, FOLATE, FERRITIN, TIBC, IRON, RETICCTPCT in the last 72 hours. Sepsis Labs: No results for input(s): PROCALCITON, LATICACIDVEN in the last 168 hours.  Recent Results (from the past 240 hour(s))  Resp Panel by RT-PCR (Flu A&B, Covid) Nasopharyngeal Swab     Status: None   Collection Time: 12/19/20 12:20 AM   Specimen: Nasopharyngeal Swab; Nasopharyngeal(NP) swabs in vial transport medium  Result Value Ref Range Status   SARS Coronavirus 2 by RT PCR NEGATIVE NEGATIVE Final    Comment: (NOTE) SARS-CoV-2 target nucleic acids are NOT DETECTED.  The SARS-CoV-2 RNA is generally detectable in upper respiratory specimens during the acute phase of infection. The lowest concentration of SARS-CoV-2 viral copies this assay can detect is 138 copies/mL. A negative result does not preclude SARS-Cov-2 infection and should not be used as the sole basis for treatment or other patient management decisions. A negative result may occur with  improper specimen collection/handling, submission of specimen other than nasopharyngeal swab, presence of viral mutation(s) within the areas targeted by this assay, and inadequate number of  viral copies(<138 copies/mL). A negative result must be combined with clinical observations, patient history, and epidemiological information. The expected result is Negative.  Fact Sheet for Patients:  EntrepreneurPulse.com.au  Fact Sheet for Healthcare Providers:  IncredibleEmployment.be  This test is no t yet approved or cleared by the [place] FDA and  has been authorized for detection and/or diagnosis of SARS-CoV-2 by FDA under an Emergency Use Authorization (EUA). This EUA will remain  in effect (meaning this test can be used) for the duration of the COVID-19 declaration under Section 564(b)(1) of the Act, 21 U.S.C.section 360bbb-3(b)(1), unless the authorization is terminated  or revoked sooner.       Influenza A by PCR NEGATIVE NEGATIVE Final   Influenza B by PCR NEGATIVE NEGATIVE Final    Comment: (NOTE) The Xpert Xpress SARS-CoV-2/FLU/RSV plus assay is intended as an aid in the diagnosis of influenza from Nasopharyngeal swab specimens and should not be used as a sole basis for treatment. Nasal washings and aspirates are unacceptable for Xpert Xpress SARS-CoV-2/FLU/RSV testing.  Fact Sheet for Patients: EntrepreneurPulse.com.au  Fact Sheet for Healthcare Providers: IncredibleEmployment.be  This test is not yet approved or cleared by the [place] FDA and has been authorized for detection and/or diagnosis of SARS-CoV-2 by FDA under an Emergency Use Authorization (EUA). This EUA will remain in effect (meaning this test can be used) for the duration of the COVID-19 declaration under Section 564(b)(1) of the Act, 21 U.S.C. section 360bbb-3(b)(1), unless the authorization is terminated or revoked.  Performed at [facility], [address] [place], [place] [address]       Radiology Studies: DG Chest 2 View  Result Date: 12/19/2020 CLINICAL DATA:  50 year old male with  chest pain and shortness of breath. EXAM: CHEST - 2 VIEW COMPARISON:  Chest radiograph dated 12/09/2020. FINDINGS: No focal consolidation, pleural effusion or pneumothorax. The cardiac silhouette is within limits. No acute osseous pathology. IMPRESSION: No active cardiopulmonary disease. Electronically Signed   By: Anner Crete M.D.   On: 12/19/2020 21:26   CT Angio Chest PE W and/or Wo Contrast  Result Date: 12/19/2020 CLINICAL DATA:  Chest pain and short of breath EXAM: CT ANGIOGRAPHY CHEST WITH CONTRAST TECHNIQUE: Multidetector CT imaging of the chest was performed using the standard protocol during bolus administration of intravenous contrast. Multiplanar CT image reconstructions and MIPs were obtained to evaluate the vascular anatomy. CONTRAST:  OMNIPAQUE IOHEXOL 350 MG/ML SOLN COMPARISON:  Chest x-ray 12/19/2020, CT chest 06/04/2020 FINDINGS: Cardiovascular: Satisfactory opacification of the pulmonary arteries to the segmental level. No evidence of pulmonary embolism. Nonaneurysmal aorta. No dissection is seen. Coronary vascular calcification with stent. Cardiomegaly. No pericardial effusion. Mediastinum/Nodes: No enlarged mediastinal, hilar, or axillary lymph nodes. Thyroid gland, trachea, and esophagus demonstrate no significant findings. Lungs/Pleura: Lungs are clear. No pleural effusion or pneumothorax. Upper Abdomen: No acute abnormality. Musculoskeletal: No chest wall abnormality. No acute or significant osseous findings. Review of the MIP images confirms the above findings. IMPRESSION: 1. Negative for acute pulmonary embolus or aortic dissection. Clear lung fields. 2. Cardiomegaly. Electronically Signed   By: Jasmine Pang M.D.   On: 12/19/2020 23:03   ECHOCARDIOGRAM COMPLETE  Result Date: 12/20/2020    ECHOCARDIOGRAM REPORT   Patient Name:   DACOTAH CABELLO Date of Exam: 12/20/2020 Medical Rec #:  [id number]           Height:       65.0 in Accession #:    [id number]          Weight:        210.0 lb Date of Birth:  17-Feb-1971           BSA:          2.020 m Patient Age:    49 years            BP:           118/80 mmHg Patient Gender: M                   HR:           82 bpm. Exam Location:  [facility] Procedure: 2D Echo, Color Doppler, Cardiac Doppler and Intracardiac            Opacification Agent Indications:     R07.9 Chest Pain  History:         Patient has prior history of Echocardiogram examinations, most                  recent 07/12/2020. ICM; Risk Factors:Current Smoker and                  Hypertension. ETOH.  Sonographer:     Humphrey Rolls RDCS (AE) Referring Phys:  [id number] Debbe Odea Diagnosing Phys: Debbe Odea MD  Sonographer Comments: Technically difficult study due to poor echo windows. Image acquisition challenging due to patient body habitus. IMPRESSIONS  1. Left ventricular ejection fraction, by estimation, is 55 to 60%. The left ventricle has normal function. The left ventricle has no regional wall motion abnormalities. Left ventricular diastolic parameters were normal.  2. Right ventricular systolic function is normal. The right ventricular size is normal.  3. The mitral valve is normal in structure. Trivial mitral valve regurgitation. No evidence of mitral stenosis.  4. The aortic valve is normal in structure. Aortic valve regurgitation is not visualized. No aortic stenosis is present.  5. The inferior vena cava is normal in size with greater than 50% respiratory variability, suggesting right atrial pressure of 3 mmHg. FINDINGS  Left Ventricle: Left ventricular ejection fraction, by estimation, is 55 to 60%. The left ventricle has normal function. The left ventricle has no regional wall motion abnormalities. Definity contrast agent was given IV to delineate the left ventricular  endocardial borders. The left ventricular internal cavity size was normal in size. There is no left ventricular hypertrophy. Left ventricular diastolic  parameters were normal. Right Ventricle: The  right ventricular size is normal. No increase in right ventricular wall thickness. Right ventricular systolic function is normal. Left Atrium: Left atrial size was normal in size. Right Atrium: Right atrial size was normal in size. Pericardium: There is no evidence of pericardial effusion. Mitral Valve: The mitral valve is normal in structure. Trivial mitral valve regurgitation. No evidence of mitral valve stenosis. MV peak gradient, 3.8 mmHg. The mean mitral valve gradient is 2.0 mmHg. Tricuspid Valve: The tricuspid valve is normal in structure. Tricuspid valve regurgitation is not demonstrated. No evidence of tricuspid stenosis. Aortic Valve: The aortic valve is normal in structure. Aortic valve regurgitation is not visualized. No aortic stenosis is present. Aortic valve mean gradient measures 4.0 mmHg. Aortic valve peak gradient measures 6.9 mmHg. Aortic valve area, by VTI measures 4.16 cm. Pulmonic Valve: The pulmonic valve was not well visualized. Pulmonic valve regurgitation is not visualized. No evidence of pulmonic stenosis. Aorta: The aortic root is normal in size and structure. Venous: The inferior vena cava is normal in size with greater than 50% respiratory variability, suggesting right atrial pressure of 3 mmHg. IAS/Shunts: No atrial level shunt detected by color flow Doppler.  LEFT VENTRICLE PLAX 2D LVIDd:         6.20 cm      Diastology LVIDs:         4.60 cm      LV e' medial:    5.22 cm/s LV PW:         1.10 cm      LV E/e' medial:  12.7 LV IVS:        0.80 cm      LV e' lateral:   9.36 cm/s LVOT diam:     2.70 cm      LV E/e' lateral: 7.1 LV SV:         91 LV SV Index:   45 LVOT Area:     5.73 cm  LV Volumes (MOD) LV vol d, MOD A2C: 91.6 ml LV vol d, MOD A4C: 160.0 ml LV vol s, MOD A2C: 40.3 ml LV vol s, MOD A4C: 51.8 ml LV SV MOD A2C:     51.3 ml LV SV MOD A4C:     160.0 ml LV SV MOD BP:      75.3 ml RIGHT VENTRICLE RV Basal diam:  4.00 cm LEFT ATRIUM             Index LA diam:        4.40 cm  2.18 cm/m LA Vol (A2C):   42.6 ml 21.09 ml/m LA Vol (A4C):   33.1 ml 16.39 ml/m LA Biplane Vol: 37.4 ml 18.52 ml/m  AORTIC VALVE                   PULMONIC VALVE AV Area (Vmax):    4.08 cm    PV Vmax:       1.08 m/s AV Area (Vmean):   4.09 cm    PV Vmean:      70.400 cm/s AV Area (VTI):     4.16 cm    PV VTI:        0.174 m AV Vmax:           131.00 cm/s PV Peak grad:  4.7 mmHg AV Vmean:          87.400 cm/s PV Mean grad:  2.0 mmHg AV VTI:  0.219 m AV Peak Grad:      6.9 mmHg AV Mean Grad:      4.0 mmHg LVOT Vmax:         93.30 cm/s LVOT Vmean:        62.500 cm/s LVOT VTI:          0.159 m LVOT/AV VTI ratio: 0.73  AORTA Ao Root diam: 3.60 cm MITRAL VALVE MV Area (PHT): 5.13 cm    SHUNTS MV Peak grad:  3.8 mmHg    Systemic VTI:  0.16 m MV Mean grad:  2.0 mmHg    Systemic Diam: 2.70 cm MV Vmax:       0.98 m/s MV Vmean:      68.1 cm/s MV Decel Time: 148 msec MV E velocity: 66.40 cm/s MV A velocity: 68.10 cm/s MV E/A ratio:  0.98 Kate Sable MD Electronically signed by Kate Sable MD Signature Date/Time: 12/20/2020/3:06:50 PM    Final     Scheduled Meds: . aspirin EC  81 mg Oral Daily  . atorvastatin  40 mg Oral q1800  . enoxaparin (LOVENOX) injection  0.5 mg/kg Subcutaneous Q24H  . folic acid  1 mg Oral Daily  . isosorbide mononitrate  30 mg Oral Daily  . LORazepam  0-4 mg Intravenous Q6H   Followed by  . [START ON 12/22/2020] LORazepam  0-4 mg Intravenous Q12H  . multivitamin with minerals  1 tablet Oral Daily  . pantoprazole  40 mg Oral Q1200  . thiamine  100 mg Oral Daily   Or  . thiamine  100 mg Intravenous Daily   Continuous Infusions:   LOS: 2 days   Time spent: 40 minutes.   Mckinley Jewel, MD [facility]  If 7PM-7AM, please contact night-coverage [email or web address] 12/21/2020, 11:54 AM

## 2020-12-21 NOTE — Progress Notes (Signed)
Mobility Specialist - Progress Note   12/21/20 1252  Mobility  Activity Ambulated in room  Level of Assistance Independent  Assistive Device None  Distance Ambulated (ft) 70 ft  Mobility Response Tolerated well  Mobility performed by Mobility specialist  $Mobility charge 1 Mobility    Pre-mobility: 95 HR, 99% SpO2 During mobility: 103 HR, 91% SpO2 Post-mobility: 98 HR, 95% SpO2   Pt laying in bed upon arrival. Pt agreed to session. Pt independent t/o session. Pt ambulated 70' in room. Pt declined ambulating in the hallways. Pt c/o "8/10" lower back pain. However, pain does not limit mobility. O2 sat > 90% t/o session. Pt on RA. Overall, pt tolerated session well. Pt left laying in bed w/ all needs placed in reach. Nurse was notified.     Carlyne Keehan Mobility Specialist  12/21/20, 12:56 PM

## 2020-12-22 LAB — CBC
HCT: 36.3 % — ABNORMAL LOW (ref 39.0–52.0)
Hemoglobin: 12.3 g/dL — ABNORMAL LOW (ref 13.0–17.0)
MCH: 36 pg — ABNORMAL HIGH (ref 26.0–34.0)
MCHC: 33.9 g/dL (ref 30.0–36.0)
MCV: 106.1 fL — ABNORMAL HIGH (ref 80.0–100.0)
Platelets: 182 10*3/uL (ref 150–400)
RBC: 3.42 MIL/uL — ABNORMAL LOW (ref 4.22–5.81)
RDW: 13.3 % (ref 11.5–15.5)
WBC: 7.6 10*3/uL (ref 4.0–10.5)
nRBC: 0 % (ref 0.0–0.2)

## 2020-12-22 NOTE — Evaluation (Signed)
Physical Therapy Evaluation Patient Details Name: Ethan Wells MRN: [id number] DOB: December 06, 1971 Today's Date: 12/22/2020   History of Present Illness  Pt is a 50 y.o. male with medical history significant for hypertension, polysubstance abuse including cocaine, alcohol abuse, CAD, history of STEMI, complicated by cardiac arrest due to V. fib, systolic heart failure with EF 45-50 percent who presents to the emergency room with chest pain.  Patient was seen in the emergency room on 12/25 for chest pain and was ruled out for ACS with 3 - troponins.  He returned to the ED with complaints of constant sharp mid back pain chest pain of severe intensity radiating to the low left back and the left leg. Waxing and waning in intensity.  Different from his last heart attack and similar to pain that brought him to the ER on 12/25. Per chart " EKG: No acute ST-T wave changes noted.  Likely in the setting of cocaine abuse."    Clinical Impression  Pt alert, agreeable to PT. Reported 8/10 low back pain. Prior to admission pt was independent with ADLs/IADLs. 1 fall I the last 6 months.   The patient demonstrated bed mobility independently as well as sit <> stand transfers. He was able to ambulate in room without assistance and without AD 38ft (pt led seated rest breaks) and then additional 18ft. Able to stand and brush his teeth at the sink independently as well. The patient demonstrated near return to baseline level of functioning, no further acute PT needs indicated. PT to sign off. Please reconsult PT if pt status changes or acute needs are identified.      Follow Up Recommendations No PT follow up    Equipment Recommendations  None recommended by PT    Recommendations for Other Services       Precautions / Restrictions Precautions Precautions: Fall Restrictions Weight Bearing Restrictions: No      Mobility  Bed Mobility Overal bed mobility: Independent                   Transfers Overall transfer level: Independent Equipment used: None                Ambulation/Gait Ambulation/Gait assistance: Independent Gait Distance (Feet):  (44ft + 73ft) Assistive device: None       General Gait Details: decreased step height/length, cautious. no unsteadiness noted  Stairs            Wheelchair Mobility    Modified Rankin (Stroke Patients Only)       Balance Overall balance assessment: Mild deficits observed, not formally tested                                           Pertinent Vitals/Pain Pain Assessment: 0-10 Pain Score: 8  Pain Location: low back pain Pain Descriptors / Indicators: Aching Pain Intervention(s): Limited activity within patient's tolerance;Monitored during session;Repositioned;Premedicated before session    Home Living Family/patient expects to be discharged to:: Private residence Living Arrangements: Alone Available Help at Discharge: Available PRN/intermittently;Friend(s);Neighbor Type of Home: Mobile home Home Access: Stairs to enter   [employer] of Steps: 1 Home Layout: One level Home Equipment: None      Prior Function Level of Independence: Independent               Hand Dominance   Dominant Hand: Right    Extremity/Trunk Assessment  Upper Extremity Assessment Upper Extremity Assessment: Generalized weakness    Lower Extremity Assessment Lower Extremity Assessment: Generalized weakness (able to lift against gravity, move freely without hindrance)    Cervical / Trunk Assessment Cervical / Trunk Assessment: Normal  Communication   Communication: No difficulties  Cognition Arousal/Alertness: Awake/alert Behavior During Therapy: WFL for tasks assessed/performed Overall Cognitive Status: Within Functional Limits for tasks assessed                                        General Comments      Exercises Other Exercises Other  Exercises: pt able to stand at sink and brush his teeth independently   Assessment/Plan    PT Assessment Patent does not need any further PT services  PT Problem List         PT Treatment Interventions      PT Goals (Current goals can be found in the Care Plan section)       Frequency     Barriers to discharge        Co-evaluation               AM-PAC PT "6 Clicks" Mobility  Outcome Measure Help needed turning from your back to your side while in a flat bed without using bedrails?: None Help needed moving from lying on your back to sitting on the side of a flat bed without using bedrails?: None Help needed moving to and from a bed to a chair (including a wheelchair)?: None Help needed standing up from a chair using your arms (e.g., wheelchair or bedside chair)?: None Help needed to walk in hospital room?: None Help needed climbing 3-5 steps with a railing? : None 6 Click Score: 24    End of Session Equipment Utilized During Treatment: Gait belt Activity Tolerance: Patient tolerated treatment well Patient left: in bed;with call bell/phone within reach;with bed alarm set Nurse Communication: Mobility status PT Visit Diagnosis: Other abnormalities of gait and mobility (R26.89)    Time: 1025-8527 PT Time Calculation (min) (ACUTE ONLY): 14 min   Charges:   PT Evaluation $PT Eval Low Complexity: 1 Low         Lieutenant Diego PT, DPT 1:02 PM,12/22/20

## 2020-12-22 NOTE — Care Management Important Message (Signed)
Important Message  Patient Details  Name: Ethan Wells MRN: [id number] Date of Birth: [birth year]   Medicare Important Message Given:  Yes     [name] 12/22/2020, 1:21 PM

## 2020-12-22 NOTE — Progress Notes (Signed)
PROGRESS NOTE    Ethan Wells  MWU:[id number] DOB: May 31, 1971 DOA: 12/19/2020 PCP: [facility]   Brief Narrative:  Ethan Wells is a 50 y.o. male with medical history significant for hypertension, polysubstance abuse including cocaine, alcohol abuse, CAD, history of STEMI, complicated by cardiac arrest due to V. fib, systolic heart failure with EF 45-50 percent who presents to the emergency room with chest pain.  Patient was seen in the emergency room on 12/25 for chest pain and was ruled out for ACS with 3 - troponins.  On his return today, he complains of constant sharp mid back pain chest pain of severe intensity radiating to the low left back and the left leg. Waxing and waning in intensity.  Different from his last heart attack and similar to pain that brought him to the ER on 12/25.    ED Course: On arrival, tachycardic at 103 with BP 142/88 and otherwise normal vitals.  Blood work significant for troponin of 346>>347, but otherwise unremarkable.  Troponins with 10 and 11 on 12/25 EKG : Sinus at 98 with no acute ST-T wave changes Imaging: CTA chest showed cardiomegaly.  Negative for acute PE and aortic dissection  Patient given aspirin, nitroglycerin with improvement in chest pain.  He was started on a heparin infusion.   Assessment & Plan:   Elevated troponin: -Patient presented with atypical chest pain radiating to left lower back with elevated of troponin on 347.  EKG: No acute ST-T wave changes noted.  Likely in the setting of cocaine abuse. -Consult cardiology-recommend to stop heparin infusion-advised to stop using cocaine, tobacco, restart Imdur -Echo with normal wall motion, normal ejection fraction, no indication for additional cardiac testing at this time.-Cardiology signed off.Marland Kitchen  Upper back pain: -CTA chest negative for PE, aortic dissection.  Likely musculoskeletal in origin. -Continue as needed pain medications  Hypertension: Blood pressure  is stable.  Continue Imdur.  Monitor blood pressure closely  History of coronary artery disease status post PCI to LAD: -Patient denies current chest pain.  Continue aspirin, statin, Imdur  Chronic systolic CHF: Reviewed echo from 07/13/2019 showed ejection fraction of 45 to 50%.  Patient appears euvolemic on exam.   -Repeat echo shows ejection fraction of 55 to 60%, no wall motion abnormalities. -Continue aspirin, statin, Imdur.  Polysubstance abuse: Patient's UDS is positive for cocaine and opioids.  Advise cessation.  Tobacco abuse: Counseled about cessation  Alcohol abuse: -Patient drinks 6 packs of beer per day. -Continue CIWA protocol. -Continue multivitamin, thiamine and folic acid supplements.  Obesity with BMI of 34: -Diet modification/exercise and weight loss recommended.  History of hemorrhoids: Reports that he notice bright blood in the stool couple of days ago as he has history of hemorrhoids.  H&H is stable.  Continue to monitor.  Denies melena, epigastric pain, weight loss.  DVT prophylaxis: Lovenox  code Status: Full code Family Communication:  None present at bedside.  Plan of care discussed with patient in length and he verbalized understanding and agreed with it. Disposition Plan: Home in 1 day  Consultants:   Cardiology  Procedures:   CTA chest  Echo  Antimicrobials:   None  Status is: Inpatient  Dispo: The patient is from: Home              Anticipated d/c is to: Home              Anticipated d/c date is: 1 day  Patient currently is not medically stable to d/c.    Subjective: Patient seen and examined.  Resting comfortably on the bed.  Tells me that he continues to have back pain and continues to feel weak.  He denies any chest pain, shortness of breath, palpitation, leg swelling, orthopnea or PND.  Tells me that he is not comfortable going home today.  Objective: Vitals:   12/22/20 0600 12/22/20 0625 12/22/20 0715 12/22/20 1125   BP:   102/68 113/69  Pulse: 81 87 69 (!) 59  Resp:  20 18 20   Temp:   98.2 F (36.8 C) 98.5 F (36.9 C)  TempSrc:   Oral Oral  SpO2:  96% 94% 97%  Weight:      Height:        Intake/Output Summary (Last 24 hours) at 12/22/2020 1315 Last data filed at 12/22/2020 1126 Gross per 24 hour  Intake 840 ml  Output 3425 ml  Net -2585 ml   Filed Weights   12/19/20 2108 12/21/20 0525 12/22/20 0537  Weight: 95.3 kg 90.4 kg 90.6 kg    Examination:  General exam: Appears calm and comfortable, on room air, communicating well Respiratory system: Clear to auscultation. Respiratory effort normal. Cardiovascular system: S1 & S2 heard, RRR. No JVD, murmurs, rubs, gallops or clicks. No pedal edema. Gastrointestinal system: Abdomen is nondistended, soft and nontender. No organomegaly or masses felt. Normal bowel sounds heard. Central nervous system: Alert and oriented. No focal neurological deficits. Extremities: Symmetric 5 x 5 power. Skin: No rashes, lesions or ulcers Psychiatry: Judgement and insight appear normal. Mood & affect appropriate.    Data Reviewed: I have personally reviewed following labs and imaging studies  CBC: Recent Labs  Lab 12/19/20 2114 12/20/20 0415 12/21/20 0503 12/22/20 0509  WBC 11.6* 7.5 6.1 7.6  NEUTROABS 6.3  --   --   --   HGB 13.9 13.0 12.2* 12.3*  HCT 38.8* 36.3* 36.0* 36.3*  MCV 101.6* 102.8* 107.8* 106.1*  PLT 240 190 170 [id number]   Basic Metabolic Panel: Recent Labs  Lab 12/19/20 2114 12/20/20 0415 12/21/20 0503  NA 135  --  138  K 3.8  --  3.8  CL 99  --  102  CO2 23  --  27  GLUCOSE 114*  --  105*  BUN 5*  --  6  CREATININE 0.59*  --  0.68  CALCIUM 9.0  --  9.2  MG  --  1.9  --   PHOS  --  2.7  --    GFR: Estimated Creatinine Clearance: 116.8 mL/min (by C-G formula based on SCr of 0.68 mg/dL). Liver Function Tests: Recent Labs  Lab 12/19/20 2114  AST 41  ALT 35  ALKPHOS 114  BILITOT 0.7  PROT 8.2*  ALBUMIN 4.4   No results for  input(s): LIPASE, AMYLASE in the last 168 hours. No results for input(s): AMMONIA in the last 168 hours. Coagulation Profile: Recent Labs  Lab 12/19/20 2242  INR 0.9   Cardiac Enzymes: No results for input(s): CKTOTAL, CKMB, CKMBINDEX, TROPONINI in the last 168 hours. BNP (last 3 results) No results for input(s): PROBNP in the last 8760 hours. HbA1C: No results for input(s): HGBA1C in the last 72 hours. CBG: No results for input(s): GLUCAP in the last 168 hours. Lipid Profile: No results for input(s): CHOL, HDL, LDLCALC, TRIG, CHOLHDL, LDLDIRECT in the last 72 hours. Thyroid Function Tests: No results for input(s): TSH, T4TOTAL, FREET4, T3FREE, THYROIDAB in the last 72 hours.  Anemia Panel: No results for input(s): VITAMINB12, FOLATE, FERRITIN, TIBC, IRON, RETICCTPCT in the last 72 hours. Sepsis Labs: No results for input(s): PROCALCITON, LATICACIDVEN in the last 168 hours.  Recent Results (from the past 240 hour(s))  Resp Panel by RT-PCR (Flu A&B, Covid) Nasopharyngeal Swab     Status: None   Collection Time: 12/19/20 12:20 AM   Specimen: Nasopharyngeal Swab; Nasopharyngeal(NP) swabs in vial transport medium  Result Value Ref Range Status   SARS Coronavirus 2 by RT PCR NEGATIVE NEGATIVE Final    Comment: (NOTE) SARS-CoV-2 target nucleic acids are NOT DETECTED.  The SARS-CoV-2 RNA is generally detectable in upper respiratory specimens during the acute phase of infection. The lowest concentration of SARS-CoV-2 viral copies this assay can detect is 138 copies/mL. A negative result does not preclude SARS-Cov-2 infection and should not be used as the sole basis for treatment or other patient management decisions. A negative result may occur with  improper specimen collection/handling, submission of specimen other than nasopharyngeal swab, presence of viral mutation(s) within the areas targeted by this assay, and inadequate number of viral copies(<138 copies/mL). A negative  result must be combined with clinical observations, patient history, and epidemiological information. The expected result is Negative.  Fact Sheet for Patients:  EntrepreneurPulse.com.au  Fact Sheet for Healthcare Providers:  IncredibleEmployment.be  This test is no t yet approved or cleared by the [place] FDA and  has been authorized for detection and/or diagnosis of SARS-CoV-2 by FDA under an Emergency Use Authorization (EUA). This EUA will remain  in effect (meaning this test can be used) for the duration of the COVID-19 declaration under Section 564(b)(1) of the Act, 21 U.S.C.section 360bbb-3(b)(1), unless the authorization is terminated  or revoked sooner.       Influenza A by PCR NEGATIVE NEGATIVE Final   Influenza B by PCR NEGATIVE NEGATIVE Final    Comment: (NOTE) The Xpert Xpress SARS-CoV-2/FLU/RSV plus assay is intended as an aid in the diagnosis of influenza from Nasopharyngeal swab specimens and should not be used as a sole basis for treatment. Nasal washings and aspirates are unacceptable for Xpert Xpress SARS-CoV-2/FLU/RSV testing.  Fact Sheet for Patients: EntrepreneurPulse.com.au  Fact Sheet for Healthcare Providers: IncredibleEmployment.be  This test is not yet approved or cleared by the [place] FDA and has been authorized for detection and/or diagnosis of SARS-CoV-2 by FDA under an Emergency Use Authorization (EUA). This EUA will remain in effect (meaning this test can be used) for the duration of the COVID-19 declaration under Section 564(b)(1) of the Act, 21 U.S.C. section 360bbb-3(b)(1), unless the authorization is terminated or revoked.  Performed at [facility], [address] [place], [place] [address]       Radiology Studies: No results found.  Scheduled Meds: . aspirin EC  81 mg Oral Daily  . enoxaparin (LOVENOX) injection  0.5 mg/kg  Subcutaneous Q24H  . folic acid  1 mg Oral Daily  . isosorbide mononitrate  30 mg Oral Daily  . lisinopril  2.5 mg Oral Daily  . LORazepam  0-4 mg Intravenous Q12H  . multivitamin with minerals  1 tablet Oral Daily  . pantoprazole  40 mg Oral Q1200  . rosuvastatin  40 mg Oral Daily  . thiamine  100 mg Oral Daily   Or  . thiamine  100 mg Intravenous Daily   Continuous Infusions:   LOS: 3 days   Time spent: 40 minutes.   Mckinley Jewel, MD [facility]  If 7PM-7AM, please  contact night-coverage [email or web address] 12/22/2020, 1:15 PM

## 2020-12-23 MED ORDER — LISINOPRIL 2.5 MG PO TABS: 2.5000 mg | ORAL_TABLET | Freq: Every day | ORAL | 0 refills | Status: DC

## 2020-12-23 MED ORDER — TRAMADOL HCL 50 MG PO TABS: 50.0000 mg | ORAL_TABLET | Freq: Four times a day (QID) | ORAL | 0 refills | Status: DC | PRN

## 2020-12-23 NOTE — Discharge Summary (Signed)
Physician Discharge Summary  Ethan Wells [id number] DOB: 05-11-1971 DOA: 12/19/2020  PCP: [facility]  Admit date: 12/19/2020 Discharge date: 12/23/2020  Admitted From: Home Disposition: Home  Recommendations for Outpatient Follow-up:  1. Follow-up with PCP in 1 week 2. Repeat CBC and BMP on follow-up visit  3. follow-up with cardiology outpatient 4. Advise-tobacco, alcohol, cocaine cessation 5. Follow-up with pain management outpatient  Home Health: None Equipment/Devices: None Discharge Condition: Stable CODE STATUS: Full code Diet recommendation: Heart healthy diet  Brief/Interim Summary: Ethan Pane Childressis a 50 y.o.malewith medical history significant forhypertension, polysubstance abuse including cocaine, alcohol abuse, CAD, history of STEMI,complicated by cardiac arrest due to V. fib, systolic heart failure with EF 45-50 percent who presents to the emergency room with chest pain. Patient was seen in the emergency room on 12/25 for chest pain and was ruled out for ACS with 3 - troponins. On his return today, he complains of constantsharpmid back painchest pain of severe intensity radiating to the low left back and the left leg. Waxing and waning in intensity. Different from his last heart attack and similar to pain that brought him to the ER on 12/25.  ED Course:On arrival, tachycardic at 103 with BP 142/88 and otherwise normal vitals. Blood work significant for troponin of 346>>347,but otherwise unremarkable. Troponins with 10 and 11 on 12/25 EKG :Sinus at 98 with no acute ST-T wave changes Imaging:CTA chest showed cardiomegaly. Negative for acute PE and aortic dissection  Patient given aspirin, nitroglycerin with improvement in chest pain. He was started on a heparin infusion.   Elevated troponin: -Patient presented with atypical chest pain radiating to left lower back with elevated of troponin on 347.  EKG: No acute ST-T wave  changes noted.  Likely in the setting of cocaine abuse.  Patient started on heparin GTT in ED. -Consulted cardiology-recommend to stop heparin infusion-advised to stop using cocaine, tobacco -Continued aspirin, statin, Imdur, low-dose lisinopril 2.5 mg daily given low blood pressure  -Echo with normal wall motion, normal ejection fraction, no indication for additional cardiac testing at this time.-Cardiology signed off..  Back pain: -Chronic.  CTA chest negative for PE, aortic dissection.  Likely musculoskeletal in origin. -Continued as needed pain medications -Patient discharged on tramadol 50 mg every 6 hours as needed as per his request.  Advised to follow-up with pain management outpatient  Hypertension: Blood pressure remained stable on Imdur and low-dose lisinopril.  History of coronary artery disease status post PCI to LAD: -His atypical atypical chest pain resolved. Continued aspirin, statin, Imdur  Chronic systolic CHF:  Patient appears euvolemic on exam.   -Repeat echo shows ejection fraction of 55 to 60%, no wall motion abnormalities. -Continued aspirin, statin, Imdur, lisinopril.  Polysubstance abuse: Patient's UDS  positive for cocaine and opioids.  Advised cessation.  Tobacco abuse: Counseled about cessation  Alcohol abuse: -Patient drinks 6 packs of beer per day. -Started on CIWA protocol. -Continued multivitamin, thiamine and folic acid supplements. -Alert and oriented and pleasant on the day of discharge.  Counseled about cessation and he verbalized understanding  Obesity with BMI of 34: -Diet modification/exercise and weight loss recommended.  History of hemorrhoids: Reports that he notice bright blood in the stool couple of days ago as he has history of hemorrhoids.  H&H remained stable.  No Hx of melena, epigastric pain, weight loss.  Follow-up with PCP outpatient  Discharge Diagnoses:  Elevated troponin Back pain Hypertension History of coronary  artery disease status post PCI to LAD Chronic  systolic CHF Polysubstance abuse Tobacco abuse Alcohol abuse Obesity with BMI of 34 History of hemorrhoids   Discharge Instructions  Discharge Instructions    Discharge instructions   Complete by: As directed    Follow-up with PCP in 1 week Repeat CBC and BMP on follow-up visit  follow-up with cardiology outpatient Advise-tobacco, alcohol, cocaine cessation Follow-up with pain management outpatient   Increase activity slowly   Complete by: As directed      Allergies as of 12/23/2020      Reactions   Penicillins Hives      Medication List    STOP taking these medications   atorvastatin 40 MG tablet Commonly known as: LIPITOR   predniSONE 20 MG tablet Commonly known as: Deltasone     TAKE these medications   albuterol 108 (90 Base) MCG/ACT inhaler Commonly known as: VENTOLIN HFA Inhale 2 puffs into the lungs every 4 (four) hours as needed for wheezing or shortness of breath.   aspirin 81 MG chewable tablet Chew 1 tablet (81 mg total) by mouth daily.   carvedilol 6.25 MG tablet Commonly known as: COREG Take 6.25 mg by mouth 2 (two) times daily.   Fish Oil 1000 MG Caps Take 2 capsules by mouth daily.   folic acid 1 MG tablet Commonly known as: FOLVITE Take 1 tablet (1 mg total) by mouth daily.   gabapentin 300 MG capsule Commonly known as: NEURONTIN Take 600 mg by mouth at bedtime.   isosorbide mononitrate 30 MG 24 hr tablet Commonly known as: IMDUR Take 30 mg by mouth daily.   lisinopril 2.5 MG tablet Commonly known as: ZESTRIL Take 1 tablet (2.5 mg total) by mouth daily. Start taking on: December 24, 2020 What changed:   medication strength  how much to take   multivitamin with minerals Tabs tablet Take 1 tablet by mouth daily.   nitroGLYCERIN 0.4 MG SL tablet Commonly known as: NITROSTAT Place 1 tablet (0.4 mg total) under the tongue every 5 (five) minutes as needed for chest pain.    pantoprazole 40 MG tablet Commonly known as: PROTONIX Take 1 tablet (40 mg total) by mouth daily at 12 noon.   rosuvastatin 40 MG tablet Commonly known as: CRESTOR Take 40 mg by mouth daily.   thiamine 100 MG tablet Take 1 tablet (100 mg total) by mouth daily.   traMADol 50 MG tablet Commonly known as: ULTRAM Take 1 tablet (50 mg total) by mouth every 6 (six) hours as needed for severe pain.   vitamin E 45 MG (100 UNITS) capsule Take 200 Units by mouth daily.       Allergies  Allergen Reactions  . Penicillins Hives    Consultations:  Cardiology   Procedures/Studies: DG Chest 2 View  Result Date: 12/19/2020 CLINICAL DATA:  50 year old male with chest pain and shortness of breath. EXAM: CHEST - 2 VIEW COMPARISON:  Chest radiograph dated 12/09/2020. FINDINGS: No focal consolidation, pleural effusion or pneumothorax. The cardiac silhouette is within limits. No acute osseous pathology. IMPRESSION: No active cardiopulmonary disease. Electronically Signed   By: Anner Crete M.D.   On: 12/19/2020 21:26   CT Angio Chest PE W and/or Wo Contrast  Result Date: 12/19/2020 CLINICAL DATA:  Chest pain and short of breath EXAM: CT ANGIOGRAPHY CHEST WITH CONTRAST TECHNIQUE: Multidetector CT imaging of the chest was performed using the standard protocol during bolus administration of intravenous contrast. Multiplanar CT image reconstructions and MIPs were obtained to evaluate the vascular anatomy. CONTRAST:  120mL OMNIPAQUE  IOHEXOL 350 MG/ML SOLN COMPARISON:  Chest x-ray 12/19/2020, CT chest 06/04/2020 FINDINGS: Cardiovascular: Satisfactory opacification of the pulmonary arteries to the segmental level. No evidence of pulmonary embolism. Nonaneurysmal aorta. No dissection is seen. Coronary vascular calcification with stent. Cardiomegaly. No pericardial effusion. Mediastinum/Nodes: No enlarged mediastinal, hilar, or axillary lymph nodes. Thyroid gland, trachea, and esophagus demonstrate no  significant findings. Lungs/Pleura: Lungs are clear. No pleural effusion or pneumothorax. Upper Abdomen: No acute abnormality. Musculoskeletal: No chest wall abnormality. No acute or significant osseous findings. Review of the MIP images confirms the above findings. IMPRESSION: 1. Negative for acute pulmonary embolus or aortic dissection. Clear lung fields. 2. Cardiomegaly. Electronically Signed   By: Donavan Foil M.D.   On: 12/19/2020 23:03   MR THORACIC SPINE WO CONTRAST  Result Date: 12/08/2020 CLINICAL DATA:  Back pain for 4 days EXAM: MRI THORACIC AND LUMBAR SPINE WITHOUT CONTRAST TECHNIQUE: Multiplanar and multiecho pulse sequences of the thoracic and lumbar spine were obtained without intravenous contrast. COMPARISON:  CT 06/04/2020 FINDINGS: MRI THORACIC SPINE FINDINGS Alignment:  Physiologic. Vertebrae: No fracture, evidence of discitis, or bone lesion. Cord:  Normal signal and morphology. Paraspinal and other soft tissues: Negative. Disc levels: Disc desiccation and mild disc height loss at T8-9, T9-10, and T11-12. Mild bilateral facet hypertrophy is most prevalent at T9-10 and T10-11. There is no focal disc protrusion. No canal or foraminal stenosis at any level. Mild posterior epidural fat deposition throughout the thoracic spine. MRI LUMBAR SPINE FINDINGS Segmentation:  Standard. Alignment:  Physiologic. Vertebrae:  No fracture, evidence of discitis, or bone lesion. Conus medullaris and cauda equina: Conus extends to the L1 level. Conus and cauda equina appear normal. Paraspinal and other soft tissues: Negative. Disc levels: T12-L1: Unremarkable. L1-L2: Unremarkable. L2-L3: Minimal circumferential disc bulge. No foraminal or canal stenosis. L3-L4: Minimal circumferential disc bulge. No foraminal or canal stenosis. L4-L5: Small central disc protrusion with posterior annular fissure. Slight cranial extension of disc material. No significant foraminal stenosis. No canal stenosis. L5-S1: Shallow disc  protrusion. Epidural lipomatosis. No foraminal or canal stenosis. IMPRESSION: 1. No acute osseous abnormality of the thoracic or lumbar spine. 2. Mild thoracolumbar degenerative disc disease, as described above. No canal or neural foraminal stenosis at any level. Electronically Signed   By: Davina Poke D.O.   On: 12/08/2020 10:57   MR LUMBAR SPINE WO CONTRAST  Result Date: 12/08/2020 CLINICAL DATA:  Back pain for 4 days EXAM: MRI THORACIC AND LUMBAR SPINE WITHOUT CONTRAST TECHNIQUE: Multiplanar and multiecho pulse sequences of the thoracic and lumbar spine were obtained without intravenous contrast. COMPARISON:  CT 06/04/2020 FINDINGS: MRI THORACIC SPINE FINDINGS Alignment:  Physiologic. Vertebrae: No fracture, evidence of discitis, or bone lesion. Cord:  Normal signal and morphology. Paraspinal and other soft tissues: Negative. Disc levels: Disc desiccation and mild disc height loss at T8-9, T9-10, and T11-12. Mild bilateral facet hypertrophy is most prevalent at T9-10 and T10-11. There is no focal disc protrusion. No canal or foraminal stenosis at any level. Mild posterior epidural fat deposition throughout the thoracic spine. MRI LUMBAR SPINE FINDINGS Segmentation:  Standard. Alignment:  Physiologic. Vertebrae:  No fracture, evidence of discitis, or bone lesion. Conus medullaris and cauda equina: Conus extends to the L1 level. Conus and cauda equina appear normal. Paraspinal and other soft tissues: Negative. Disc levels: T12-L1: Unremarkable. L1-L2: Unremarkable. L2-L3: Minimal circumferential disc bulge. No foraminal or canal stenosis. L3-L4: Minimal circumferential disc bulge. No foraminal or canal stenosis. L4-L5: Small central disc protrusion with posterior annular  fissure. Slight cranial extension of disc material. No significant foraminal stenosis. No canal stenosis. L5-S1: Shallow disc protrusion. Epidural lipomatosis. No foraminal or canal stenosis. IMPRESSION: 1. No acute osseous abnormality of  the thoracic or lumbar spine. 2. Mild thoracolumbar degenerative disc disease, as described above. No canal or neural foraminal stenosis at any level. Electronically Signed   By: Davina Poke D.O.   On: 12/08/2020 10:57   DG Chest Port 1 View  Result Date: 12/09/2020 CLINICAL DATA:  Shortness of breath EXAM: PORTABLE CHEST 1 VIEW COMPARISON:  06/04/2020 FINDINGS: Normal heart size for technique. Normal aortic and hilar contours. Stable lung markings compared to before. There is no edema, consolidation, effusion, or pneumothorax. IMPRESSION: Stable from prior.  No acute finding. Electronically Signed   By: Monte Fantasia M.D.   On: 12/09/2020 05:53   ECHOCARDIOGRAM COMPLETE  Result Date: 12/20/2020    ECHOCARDIOGRAM REPORT   Patient Name:   Ethan Wells Date of Exam: 12/20/2020 Medical Rec #:  [id number]           Height:       65.0 in Accession #:    [id number]          Weight:       210.0 lb Date of Birth:  01-04-1971           BSA:          2.020 m Patient Age:    60 years            BP:           118/80 mmHg Patient Gender: M                   HR:           82 bpm. Exam Location:  [facility] Procedure: 2D Echo, Color Doppler, Cardiac Doppler and Intracardiac            Opacification Agent Indications:     R07.9 Chest Pain  History:         Patient has prior history of Echocardiogram examinations, most                  recent 07/12/2020. ICM; Risk Factors:Current Smoker and                  Hypertension. ETOH.  Sonographer:     Charmayne Sheer RDCS (AE) Referring Phys:  [id number] Kate Sable Diagnosing Phys: Kate Sable MD  Sonographer Comments: Technically difficult study due to poor echo windows. Image acquisition challenging due to patient body habitus. IMPRESSIONS  1. Left ventricular ejection fraction, by estimation, is 55 to 60%. The left ventricle has normal function. The left ventricle has no regional wall motion abnormalities. Left ventricular diastolic parameters were normal.  2. Right  ventricular systolic function is normal. The right ventricular size is normal.  3. The mitral valve is normal in structure. Trivial mitral valve regurgitation. No evidence of mitral stenosis.  4. The aortic valve is normal in structure. Aortic valve regurgitation is not visualized. No aortic stenosis is present.  5. The inferior vena cava is normal in size with greater than 50% respiratory variability, suggesting right atrial pressure of 3 mmHg. FINDINGS  Left Ventricle: Left ventricular ejection fraction, by estimation, is 55 to 60%. The left ventricle has normal function. The left ventricle has no regional wall motion abnormalities. Definity contrast agent was given IV to delineate the left ventricular  endocardial borders. The left ventricular internal  cavity size was normal in size. There is no left ventricular hypertrophy. Left ventricular diastolic parameters were normal. Right Ventricle: The right ventricular size is normal. No increase in right ventricular wall thickness. Right ventricular systolic function is normal. Left Atrium: Left atrial size was normal in size. Right Atrium: Right atrial size was normal in size. Pericardium: There is no evidence of pericardial effusion. Mitral Valve: The mitral valve is normal in structure. Trivial mitral valve regurgitation. No evidence of mitral valve stenosis. MV peak gradient, 3.8 mmHg. The mean mitral valve gradient is 2.0 mmHg. Tricuspid Valve: The tricuspid valve is normal in structure. Tricuspid valve regurgitation is not demonstrated. No evidence of tricuspid stenosis. Aortic Valve: The aortic valve is normal in structure. Aortic valve regurgitation is not visualized. No aortic stenosis is present. Aortic valve mean gradient measures 4.0 mmHg. Aortic valve peak gradient measures 6.9 mmHg. Aortic valve area, by VTI measures 4.16 cm. Pulmonic Valve: The pulmonic valve was not well visualized. Pulmonic valve regurgitation is not visualized. No evidence of  pulmonic stenosis. Aorta: The aortic root is normal in size and structure. Venous: The inferior vena cava is normal in size with greater than 50% respiratory variability, suggesting right atrial pressure of 3 mmHg. IAS/Shunts: No atrial level shunt detected by color flow Doppler.  LEFT VENTRICLE PLAX 2D LVIDd:         6.20 cm      Diastology LVIDs:         4.60 cm      LV e' medial:    5.22 cm/s LV PW:         1.10 cm      LV E/e' medial:  12.7 LV IVS:        0.80 cm      LV e' lateral:   9.36 cm/s LVOT diam:     2.70 cm      LV E/e' lateral: 7.1 LV SV:         91 LV SV Index:   45 LVOT Area:     5.73 cm  LV Volumes (MOD) LV vol d, MOD A2C: 91.6 ml LV vol d, MOD A4C: 160.0 ml LV vol s, MOD A2C: 40.3 ml LV vol s, MOD A4C: 51.8 ml LV SV MOD A2C:     51.3 ml LV SV MOD A4C:     160.0 ml LV SV MOD BP:      75.3 ml RIGHT VENTRICLE RV Basal diam:  4.00 cm LEFT ATRIUM             Index LA diam:        4.40 cm 2.18 cm/m LA Vol (A2C):   42.6 ml 21.09 ml/m LA Vol (A4C):   33.1 ml 16.39 ml/m LA Biplane Vol: 37.4 ml 18.52 ml/m  AORTIC VALVE                   PULMONIC VALVE AV Area (Vmax):    4.08 cm    PV Vmax:       1.08 m/s AV Area (Vmean):   4.09 cm    PV Vmean:      70.400 cm/s AV Area (VTI):     4.16 cm    PV VTI:        0.174 m AV Vmax:           131.00 cm/s PV Peak grad:  4.7 mmHg AV Vmean:          87.400 cm/s PV Mean  grad:  2.0 mmHg AV VTI:            0.219 m AV Peak Grad:      6.9 mmHg AV Mean Grad:      4.0 mmHg LVOT Vmax:         93.30 cm/s LVOT Vmean:        62.500 cm/s LVOT VTI:          0.159 m LVOT/AV VTI ratio: 0.73  AORTA Ao Root diam: 3.60 cm MITRAL VALVE MV Area (PHT): 5.13 cm    SHUNTS MV Peak grad:  3.8 mmHg    Systemic VTI:  0.16 m MV Mean grad:  2.0 mmHg    Systemic Diam: 2.70 cm MV Vmax:       0.98 m/s MV Vmean:      68.1 cm/s MV Decel Time: 148 msec MV E velocity: 66.40 cm/s MV A velocity: 68.10 cm/s MV E/A ratio:  0.98 Kate Sable MD Electronically signed by Kate Sable MD  Signature Date/Time: 12/20/2020/3:06:50 PM    Final        Subjective: Patient seen and examined.  Resting comfortably on the bed.  No new complaints.  Denies chest pain, shortness of breath, palpitation, leg swelling, orthopnea, PND, lightheadedness, dizziness, nausea or vomiting.  Comfortable going home.  Requested for pain medication for his back pain until he is seen by his PCP and pain management outpatient.  Discharge Exam: Vitals:   12/23/20 0416 12/23/20 0854  BP: 118/66 103/77  Pulse: 80 73  Resp: 20 20  Temp: 98 F (36.7 C) 98.4 F (36.9 C)  SpO2: 98% 96%   Vitals:   12/23/20 0200 12/23/20 0300 12/23/20 0416 12/23/20 0854  BP:   118/66 103/77  Pulse:   80 73  Resp: 12 15 20 20   Temp:   98 F (36.7 C) 98.4 F (36.9 C)  TempSrc:   Oral Oral  SpO2:   98% 96%  Weight:      Height:        General: Pt is alert, awake, not in acute distress Cardiovascular: RRR, S1/S2 +, no rubs, no gallops Respiratory: CTA bilaterally, no wheezing, no rhonchi Abdominal: Soft, NT, ND, bowel sounds + Extremities: no edema, no cyanosis    The results of significant diagnostics from this hospitalization (including imaging, microbiology, ancillary and laboratory) are listed below for reference.     Microbiology: Recent Results (from the past 240 hour(s))  Resp Panel by RT-PCR (Flu A&B, Covid) Nasopharyngeal Swab     Status: None   Collection Time: 12/19/20 12:20 AM   Specimen: Nasopharyngeal Swab; Nasopharyngeal(NP) swabs in vial transport medium  Result Value Ref Range Status   SARS Coronavirus 2 by RT PCR NEGATIVE NEGATIVE Final    Comment: (NOTE) SARS-CoV-2 target nucleic acids are NOT DETECTED.  The SARS-CoV-2 RNA is generally detectable in upper respiratory specimens during the acute phase of infection. The lowest concentration of SARS-CoV-2 viral copies this assay can detect is 138 copies/mL. A negative result does not preclude SARS-Cov-2 infection and should not be used  as the sole basis for treatment or other patient management decisions. A negative result may occur with  improper specimen collection/handling, submission of specimen other than nasopharyngeal swab, presence of viral mutation(s) within the areas targeted by this assay, and inadequate number of viral copies(<138 copies/mL). A negative result must be combined with clinical observations, patient history, and epidemiological information. The expected result is Negative.  Fact Sheet for Patients:  EntrepreneurPulse.com.au  Fact Sheet for Healthcare Providers:  IncredibleEmployment.be  This test is no t yet approved or cleared by the [place] FDA and  has been authorized for detection and/or diagnosis of SARS-CoV-2 by FDA under an Emergency Use Authorization (EUA). This EUA will remain  in effect (meaning this test can be used) for the duration of the COVID-19 declaration under Section 564(b)(1) of the Act, 21 U.S.C.section 360bbb-3(b)(1), unless the authorization is terminated  or revoked sooner.       Influenza A by PCR NEGATIVE NEGATIVE Final   Influenza B by PCR NEGATIVE NEGATIVE Final    Comment: (NOTE) The Xpert Xpress SARS-CoV-2/FLU/RSV plus assay is intended as an aid in the diagnosis of influenza from Nasopharyngeal swab specimens and should not be used as a sole basis for treatment. Nasal washings and aspirates are unacceptable for Xpert Xpress SARS-CoV-2/FLU/RSV testing.  Fact Sheet for Patients: EntrepreneurPulse.com.au  Fact Sheet for Healthcare Providers: IncredibleEmployment.be  This test is not yet approved or cleared by the [place] FDA and has been authorized for detection and/or diagnosis of SARS-CoV-2 by FDA under an Emergency Use Authorization (EUA). This EUA will remain in effect (meaning this test can be used) for the duration of the COVID-19 declaration under Section 564(b)(1)  of the Act, 21 U.S.C. section 360bbb-3(b)(1), unless the authorization is terminated or revoked.  Performed at [facility], [address] [place], [place] [address]      Labs: BNP (last 3 results) Recent Labs    06/04/20 1946  BNP 983.3*   Basic Metabolic Panel: Recent Labs  Lab 12/19/20 2114 12/20/20 0415 12/21/20 0503  NA 135  --  138  K 3.8  --  3.8  CL 99  --  102  CO2 23  --  27  GLUCOSE 114*  --  105*  BUN 5*  --  6  CREATININE 0.59*  --  0.68  CALCIUM 9.0  --  9.2  MG  --  1.9  --   PHOS  --  2.7  --    Liver Function Tests: Recent Labs  Lab 12/19/20 2114  AST 41  ALT 35  ALKPHOS 114  BILITOT 0.7  PROT 8.2*  ALBUMIN 4.4   No results for input(s): LIPASE, AMYLASE in the last 168 hours. No results for input(s): AMMONIA in the last 168 hours. CBC: Recent Labs  Lab 12/19/20 2114 12/20/20 0415 12/21/20 0503 12/22/20 0509  WBC 11.6* 7.5 6.1 7.6  NEUTROABS 6.3  --   --   --   HGB 13.9 13.0 12.2* 12.3*  HCT 38.8* 36.3* 36.0* 36.3*  MCV 101.6* 102.8* 107.8* 106.1*  PLT 240 190 170 182   Cardiac Enzymes: No results for input(s): CKTOTAL, CKMB, CKMBINDEX, TROPONINI in the last 168 hours. BNP: Invalid input(s): POCBNP CBG: No results for input(s): GLUCAP in the last 168 hours. D-Dimer No results for input(s): DDIMER in the last 72 hours. Hgb A1c No results for input(s): HGBA1C in the last 72 hours. Lipid Profile No results for input(s): CHOL, HDL, LDLCALC, TRIG, CHOLHDL, LDLDIRECT in the last 72 hours. Thyroid function studies No results for input(s): TSH, T4TOTAL, T3FREE, THYROIDAB in the last 72 hours.  Invalid input(s): FREET3 Anemia work up No results for input(s): VITAMINB12, FOLATE, FERRITIN, TIBC, IRON, RETICCTPCT in the last 72 hours. Urinalysis    Component Value Date/Time   COLORURINE YELLOW (A) 06/04/2020 1149   APPEARANCEUR CLEAR (A) 06/04/2020 1149   APPEARANCEUR Clear 03/16/2012 1315   LABSPEC 1.002 (  L)  06/04/2020 1149   LABSPEC 1.013 03/16/2012 1315   PHURINE 7.0 06/04/2020 1149   GLUCOSEU NEGATIVE 06/04/2020 1149   GLUCOSEU 50 mg/dL 03/16/2012 1315   HGBUR NEGATIVE 06/04/2020 [place] 06/04/2020 1149   BILIRUBINUR Negative 03/16/2012 1315   [place] 06/04/2020 1149   PROTEINUR NEGATIVE 06/04/2020 1149   NITRITE NEGATIVE 06/04/2020 1149   LEUKOCYTESUR NEGATIVE 06/04/2020 1149   LEUKOCYTESUR Negative 03/16/2012 1315   Sepsis Labs Invalid input(s): PROCALCITONIN,  WBC,  LACTICIDVEN Microbiology Recent Results (from the past 240 hour(s))  Resp Panel by RT-PCR (Flu A&B, Covid) Nasopharyngeal Swab     Status: None   Collection Time: 12/19/20 12:20 AM   Specimen: Nasopharyngeal Swab; Nasopharyngeal(NP) swabs in vial transport medium  Result Value Ref Range Status   SARS Coronavirus 2 by RT PCR NEGATIVE NEGATIVE Final    Comment: (NOTE) SARS-CoV-2 target nucleic acids are NOT DETECTED.  The SARS-CoV-2 RNA is generally detectable in upper respiratory specimens during the acute phase of infection. The lowest concentration of SARS-CoV-2 viral copies this assay can detect is 138 copies/mL. A negative result does not preclude SARS-Cov-2 infection and should not be used as the sole basis for treatment or other patient management decisions. A negative result may occur with  improper specimen collection/handling, submission of specimen other than nasopharyngeal swab, presence of viral mutation(s) within the areas targeted by this assay, and inadequate number of viral copies(<138 copies/mL). A negative result must be combined with clinical observations, patient history, and epidemiological information. The expected result is Negative.  Fact Sheet for Patients:  EntrepreneurPulse.com.au  Fact Sheet for Healthcare Providers:  IncredibleEmployment.be  This test is no t yet approved or cleared by the [place] FDA and   has been authorized for detection and/or diagnosis of SARS-CoV-2 by FDA under an Emergency Use Authorization (EUA). This EUA will remain  in effect (meaning this test can be used) for the duration of the COVID-19 declaration under Section 564(b)(1) of the Act, 21 U.S.C.section 360bbb-3(b)(1), unless the authorization is terminated  or revoked sooner.       Influenza A by PCR NEGATIVE NEGATIVE Final   Influenza B by PCR NEGATIVE NEGATIVE Final    Comment: (NOTE) The Xpert Xpress SARS-CoV-2/FLU/RSV plus assay is intended as an aid in the diagnosis of influenza from Nasopharyngeal swab specimens and should not be used as a sole basis for treatment. Nasal washings and aspirates are unacceptable for Xpert Xpress SARS-CoV-2/FLU/RSV testing.  Fact Sheet for Patients: EntrepreneurPulse.com.au  Fact Sheet for Healthcare Providers: IncredibleEmployment.be  This test is not yet approved or cleared by the [place] FDA and has been authorized for detection and/or diagnosis of SARS-CoV-2 by FDA under an Emergency Use Authorization (EUA). This EUA will remain in effect (meaning this test can be used) for the duration of the COVID-19 declaration under Section 564(b)(1) of the Act, 21 U.S.C. section 360bbb-3(b)(1), unless the authorization is terminated or revoked.  Performed at [facility], [address] [place], [place] [address]      Time coordinating discharge: Over 30 minutes  SIGNED:   Mckinley Jewel, MD  [facility] 12/23/2020, 10:07 AM Pager   If 7PM-7AM, please contact night-coverage [email or web address]

## 2020-12-23 NOTE — Progress Notes (Signed)
Mobility Specialist - Progress Note   12/23/20 1048  Mobility  Activity Refused mobility  Mobility performed by Mobility specialist    Pt refused session at this time. States "I'm tired", "I just want to sleep". Will re-attempt at a later date/time.    [name] Mobility Specialist  12/23/20, 10:49 AM

## 2020-12-24 ENCOUNTER — Other Ambulatory Visit: Payer: Self-pay

## 2020-12-24 ENCOUNTER — Emergency Department
Admission: EM | Admit: 2020-12-24 | Discharge: 2020-12-24 | Disposition: A | Discharge status: Home / Self Care | Payer: Medicare HMO | Attending: Emergency Medicine | Admitting: Emergency Medicine

## 2020-12-24 ENCOUNTER — Encounter: Payer: Self-pay | Admitting: Emergency Medicine

## 2020-12-24 DIAGNOSIS — I2510 Atherosclerotic heart disease of native coronary artery without angina pectoris: Secondary | ICD-10-CM | POA: Diagnosis not present

## 2020-12-24 DIAGNOSIS — Z79899 Other long term (current) drug therapy: Secondary | ICD-10-CM | POA: Insufficient documentation

## 2020-12-24 DIAGNOSIS — I5022 Chronic systolic (congestive) heart failure: Secondary | ICD-10-CM | POA: Diagnosis not present

## 2020-12-24 DIAGNOSIS — R52 Pain, unspecified: Secondary | ICD-10-CM | POA: Diagnosis not present

## 2020-12-24 DIAGNOSIS — M545 Low back pain, unspecified: Secondary | ICD-10-CM | POA: Diagnosis not present

## 2020-12-24 DIAGNOSIS — F10929 Alcohol use, unspecified with intoxication, unspecified: Secondary | ICD-10-CM | POA: Diagnosis not present

## 2020-12-24 DIAGNOSIS — Z7982 Long term (current) use of aspirin: Secondary | ICD-10-CM | POA: Insufficient documentation

## 2020-12-24 DIAGNOSIS — M5459 Other low back pain: Secondary | ICD-10-CM | POA: Diagnosis not present

## 2020-12-24 DIAGNOSIS — G8929 Other chronic pain: Secondary | ICD-10-CM | POA: Insufficient documentation

## 2020-12-24 DIAGNOSIS — I110 Hypertensive heart disease with heart failure: Secondary | ICD-10-CM | POA: Insufficient documentation

## 2020-12-24 DIAGNOSIS — F17210 Nicotine dependence, cigarettes, uncomplicated: Secondary | ICD-10-CM | POA: Diagnosis not present

## 2020-12-24 DIAGNOSIS — M549 Dorsalgia, unspecified: Secondary | ICD-10-CM | POA: Diagnosis not present

## 2020-12-24 DIAGNOSIS — R001 Bradycardia, unspecified: Secondary | ICD-10-CM | POA: Diagnosis not present

## 2020-12-24 LAB — CBC
HCT: 40.2 % (ref 39.0–52.0)
Hemoglobin: 14.3 g/dL (ref 13.0–17.0)
MCH: 36.2 pg — ABNORMAL HIGH (ref 26.0–34.0)
MCHC: 35.6 g/dL (ref 30.0–36.0)
MCV: 101.8 fL — ABNORMAL HIGH (ref 80.0–100.0)
Platelets: 242 10*3/uL (ref 150–400)
RBC: 3.95 MIL/uL — ABNORMAL LOW (ref 4.22–5.81)
RDW: 12.8 % (ref 11.5–15.5)
WBC: 12.6 10*3/uL — ABNORMAL HIGH (ref 4.0–10.5)
nRBC: 0 % (ref 0.0–0.2)

## 2020-12-24 LAB — BASIC METABOLIC PANEL
Anion gap: 13 (ref 5–15)
BUN: <5 mg/dL — ABNORMAL LOW (ref 6–20)
CO2: 21 mmol/L — ABNORMAL LOW (ref 22–32)
Calcium: 9.7 mg/dL (ref 8.9–10.3)
Chloride: 102 mmol/L (ref 98–111)
Creatinine, Ser: 0.45 mg/dL — ABNORMAL LOW (ref 0.61–1.24)
GFR, Estimated: >60 mL/min (ref >60)
Glucose, Bld: 123 mg/dL — ABNORMAL HIGH (ref 70–99)
Potassium: 3.9 mmol/L (ref 3.5–5.1)
Sodium: 136 mmol/L (ref 135–145)

## 2020-12-24 LAB — TROPONIN I (HIGH SENSITIVITY): Troponin I (High Sensitivity): 33 ng/L — ABNORMAL HIGH (ref <18)

## 2020-12-24 MED ORDER — ORPHENADRINE CITRATE ER 100 MG PO TB12: 100.0000 mg | ORAL_TABLET | Freq: Two times a day (BID) | ORAL | 0 refills | Status: DC

## 2020-12-24 MED ORDER — KETOROLAC TROMETHAMINE 30 MG/ML IJ SOLN
30.0000 mg | Freq: Once | INTRAMUSCULAR | Status: AC
  Administered 2020-12-24: 30 mg via INTRAMUSCULAR
  Filled 2020-12-24: qty 1

## 2020-12-24 MED ORDER — ORPHENADRINE CITRATE 30 MG/ML IJ SOLN
60.0000 mg | Freq: Two times a day (BID) | INTRAMUSCULAR | Status: DC
  Administered 2020-12-24: 60 mg via INTRAMUSCULAR
  Filled 2020-12-24: qty 2

## 2020-12-24 NOTE — ED Triage Notes (Signed)
Attempted to reassess VS, pt refused

## 2020-12-24 NOTE — Discharge Instructions (Addendum)
Follow discharge care instruction take medication as directed.  Advised to follow-up with PCP by calling for an appointment on Monday morning.  You had 2 prescription waiting be picked up at your pharmacy.  Combination of this medication may cause drowsiness.

## 2020-12-24 NOTE — ED Triage Notes (Signed)
Pt called to room, pt states that he has waited long enough and is leaving. Pt encouraged to stay but states that his ride is on the way and he is leaving.

## 2020-12-24 NOTE — ED Provider Notes (Signed)
[name]   ____________________________________________   Event Date/Time   First MD Initiated Contact with Patient 12/24/20 [phone]     (approximate)  I have reviewed the triage vital signs and the nursing notes.   HISTORY  Chief Complaint Back Pain    HPI Ethan Wells is a 50 y.o. male patient arrived via ambulance complaining of back pain.  Patient said he was discharged from the hospital yesterday earlier this morning after being admitted for 4 days for dyspnea.  Patient is having back pain.  Patient states his back pain is causing chest pain..  Patient attempted to leave  2 times secondary to waiting time.  Patient was encouraged to stay.      Past Medical History:  Diagnosis Date  . CAD (coronary artery disease)    a. 01/2011 Anterior STEMI/Cath/PCI: LM nl, LAD 100d (3.5x87mm Vision BMS placed), LCX 5m, RI 50, RCA min irregs, EF 40% w/ apical, inferoapical HK.  . Cardiac arrest - ventricular fibrillation    a. In setting of STEMI 01/2011  . Cocaine abuse ([place])   . ETOH abuse    a. 6-12 beers/day  . Hemorrhoids   . Hypertension   . Ischemic cardiomyopathy    a. 06/2011 Echo: EF 45-50%, No rwma  . Marijuana abuse   . Tobacco abuse    a. 1/2 ppd x 26 yrs    Patient Active Problem List   Diagnosis Date Noted  . Elevated troponin   . Notalgia   . NSTEMI (non-ST elevated myocardial infarction) ([place]) 12/19/2020  . History of ST elevation myocardial infarction (STEMI) 12/19/2020  . Alcohol use disorder, moderate, dependence ([place]) 12/19/2020  . Hyponatremia 06/04/2020  . Hypomagnesemia 06/04/2020  . Chronic systolic CHF (congestive heart failure) ([place]) 06/04/2020  . Leukocytosis 06/04/2020  . Abdominal pain 06/04/2020  . Hypokalemia 06/04/2020  . Ischemic cardiomyopathy   . Polysubstance abuse ([place])   . Chest pain 07/12/2019  . Abdominal pain, RUQ 05/25/2018  . Chest pain, mid sternal 11/17/2012   . Cocaine abuse ([place])   . Marijuana abuse   . TOBACCO ABUSE 02/26/2011  . HYPERTENSION, BENIGN 02/26/2011  . CAD, NATIVE VESSEL 02/26/2011  . Other specified forms of chronic ischemic heart disease 02/26/2011    Past Surgical History:  Procedure Laterality Date  . CORONARY ANGIOPLASTY WITH STENT PLACEMENT      Prior to Admission medications   Medication Sig Start Date End Date Taking? Authorizing Provider  orphenadrine (NORFLEX) 100 MG tablet Take 1 tablet (100 mg total) by mouth 2 (two) times daily. 12/24/20  Yes Sable Feil, PA-C  albuterol (VENTOLIN HFA) 108 (90 Base) MCG/ACT inhaler Inhale 2 puffs into the lungs every 4 (four) hours as needed for wheezing or shortness of breath. 12/09/20   Paulette Blanch, MD  aspirin 81 MG chewable tablet Chew 1 tablet (81 mg total) by mouth daily. 06/07/20   Fritzi Mandes, MD  carvedilol (COREG) 6.25 MG tablet Take 6.25 mg by mouth 2 (two) times daily. 10/05/20   [provider]  folic acid (FOLVITE) 1 MG tablet Take 1 tablet (1 mg total) by mouth daily. 06/07/20   Fritzi Mandes, MD  gabapentin (NEURONTIN) 300 MG capsule Take 600 mg by mouth at bedtime. 11/29/20   [provider]  isosorbide mononitrate (IMDUR) 30 MG 24 hr tablet Take 30 mg by mouth daily. 10/05/20   [provider]  lisinopril (ZESTRIL) 2.5 MG tablet Take 1 tablet (  2.5 mg total) by mouth daily. 12/24/20   Pahwani, Michell Heinrich, MD  Multiple Vitamin (MULTIVITAMIN WITH MINERALS) TABS tablet Take 1 tablet by mouth daily. 06/07/20   Fritzi Mandes, MD  nitroGLYCERIN (NITROSTAT) 0.4 MG SL tablet Place 1 tablet (0.4 mg total) under the tongue every 5 (five) minutes as needed for chest pain. 06/06/20   Fritzi Mandes, MD  Omega-3 Fatty Acids (FISH OIL) 1000 MG CAPS Take 2 capsules by mouth daily.    [provider]  pantoprazole (PROTONIX) 40 MG tablet Take 1 tablet (40 mg total) by mouth daily at 12 noon. 06/06/20   Fritzi Mandes, MD  rosuvastatin (CRESTOR) 40 MG tablet Take  40 mg by mouth daily. 11/14/20   [provider]  thiamine 100 MG tablet Take 1 tablet (100 mg total) by mouth daily. 06/07/20   Fritzi Mandes, MD  traMADol (ULTRAM) 50 MG tablet Take 1 tablet (50 mg total) by mouth every 6 (six) hours as needed for severe pain. 12/23/20   Pahwani, Michell Heinrich, MD  vitamin E 45 MG (100 UNITS) capsule Take 200 Units by mouth daily.    [provider]    Allergies Penicillins  Family History  Problem Relation Age of Onset  . Coronary artery disease Mother        alive  . Other Other        2 sisters alive and well  . Alcohol abuse Father        died @ 28    Social History Social History   Tobacco Use  . Smoking status: Current Every Day Smoker    Packs/day: 0.50    Years: 26.00    Pack years: 13.00    Types: Cigarettes  . Smokeless tobacco: Never Used  Vaping Use  . Vaping Use: Never used  Substance Use Topics  . Alcohol use: Yes    Comment: at least a 6 pack of beer daily and often a 12 pack  . Drug use: Not Currently    Types: Cocaine, Marijuana    Comment:  cocaine used several weeks ago; marijuana today    Review of Systems Constitutional: No fever/chills Eyes: No visual changes. ENT: No sore throat. Cardiovascular: Denies chest pain. Respiratory: Denies shortness of breath. Gastrointestinal: No abdominal pain.  No nausea, no vomiting.  No diarrhea.  No constipation. Genitourinary: Negative for dysuria. Musculoskeletal: Positive for back pain. Skin: Negative for rash. Neurological: Negative for headaches, focal weakness or numbness. Psychiatric:  EtOH and polysubstance abuse Endocrine:  Hypertension Hematological/Lymphatic: Leukocytosis allergic/Immunilogical: **}  ____________________________________________   PHYSICAL EXAM:  VITAL SIGNS: ED Triage Vitals  Enc Vitals Group     BP 12/24/20 0300 138/80     Pulse Rate 12/24/20 0300 95     Resp 12/24/20 0300 16     Temp 12/24/20 0300 98.4 F (36.9 C)     Temp  Source 12/24/20 0300 Oral     SpO2 12/24/20 0300 96 %     Weight 12/24/20 0301 199 lb 11.8 oz (90.6 kg)     Height 12/24/20 0301 5\' 5"  (1.651 m)     Head Circumference --      Peak Flow --      Pain Score 12/24/20 0300 10     Pain Loc --      Pain Edu? --      Excl. in [place]? --     Constitutional: Alert and oriented. Well appearing and in no acute distress.  Appears agitated. Neck:  No stridor.  Hematological/Lymphatic/Immunilogical: No cervical lymphadenopathy. Cardiovascular: Normal rate, regular rhythm. Grossly normal heart sounds.  Good peripheral circulation. Respiratory: Normal respiratory effort.  No retractions. Lungs CTAB. Gastrointestinal: Soft and nontender. No distention. No abdominal bruits. No CVA tenderness. Genitourinary: Deferred Musculoskeletal: No obvious thoracic or lumbar spine deformity.  Moderate palpation to T11-L3.  Neurologic:  Normal speech and language. No gross focal neurologic deficits are appreciated. No gait instability. Skin:  Skin is warm, dry and intact. No rash noted. Psychiatric: Mood and affect are normal. Speech and behavior are normal.  ____________________________________________   LABS (all labs ordered are listed, but only abnormal results are displayed)  Labs Reviewed  BASIC METABOLIC PANEL - Abnormal; Notable for the following components:      Result Value   CO2 21 (*)    Glucose, Bld 123 (*)    BUN <5 (*)    Creatinine, Ser 0.45 (*)    All other components within normal limits  CBC - Abnormal; Notable for the following components:   WBC 12.6 (*)    RBC 3.95 (*)    MCV 101.8 (*)    MCH 36.2 (*)    All other components within normal limits  TROPONIN I (HIGH SENSITIVITY) - Abnormal; Notable for the following components:   Troponin I (High Sensitivity) 33 (*)    All other components within normal limits  TROPONIN I (HIGH SENSITIVITY)   ____________________________________________  EKG  Read by heart station Dr. With no acute  findings. ____________________________________________  [place], personally viewed and evaluated these images (plain radiographs) as part of my medical decision making, as well as reviewing the written report by the radiologist.  ED MD interpretation:   Official radiology report(s): No results found.  ____________________________________________   PROCEDURES  Procedure(s) performed (including Critical Care):  Procedures   ____________________________________________   INITIAL IMPRESSION / ASSESSMENT AND PLAN / ED COURSE  As part of my medical decision making, I reviewed the following data within the [place]        Patient returns status post early discharge today complaining of back pain.  Patient appears agitated due to being released for stronger pain medication than tramadol.  That is post injection of Norflex and tramadol patient is resting peacefully in bed.  Discussed with patient rationale for not writing stronger pain medication at this time.  Patient advised to pick up a prescription for tramadol and a prescription for Norflex was added.  Patient advised to follow-up with PCP.     ____________________________________________   FINAL CLINICAL IMPRESSION(S) / ED DIAGNOSES  Final diagnoses:  Chronic midline low back pain without sciatica     ED Discharge Orders         Ordered    orphenadrine (NORFLEX) 100 MG tablet  2 times daily        12/24/20 [id number]          *Please note:  Ethan Wells was evaluated in Emergency Department on 12/24/2020 for the symptoms described in the history of present illness. He was evaluated in the context of the global COVID-19 pandemic, which necessitated consideration that the patient might be at risk for infection with the SARS-CoV-2 virus that causes COVID-19. Institutional protocols and algorithms that pertain to the evaluation of patients at risk for COVID-19 are in a state of rapid  change based on information released by regulatory bodies including the CDC and federal and state organizations. These policies and algorithms were followed during  the patient's care in the ED.  Some ED evaluations and interventions may be delayed as a result of limited staffing during and the pandemic.*   Note:  This document was prepared using Dragon voice recognition software and may include unintentional dictation errors.    Sable Feil, PA-C 12/24/20 [id number]    Duffy Bruce, MD 12/25/20 [phone]

## 2020-12-24 NOTE — ED Triage Notes (Signed)
Pt to ED via [facility], pt states that he was discharged from the hospital on 12/23/20. Pt states that he is having back pain. Pt is in NAD at this time.

## 2020-12-24 NOTE — ED Triage Notes (Signed)
Pt agreeing to stay and be seen

## 2020-12-24 NOTE — ED Notes (Signed)
Pt able to ambulate outside and back into the lobby without difficulty or distress.

## 2020-12-24 NOTE — ED Triage Notes (Signed)
FIRST NURSE NOTE: pt here via [facility] with c/o back pain. + etoh use.   While medic was giving report, pt got up from wheelchair and walked out front doors.

## 2020-12-26 ENCOUNTER — Other Ambulatory Visit: Payer: Self-pay

## 2020-12-26 NOTE — Patient Outreach (Signed)
[facility]) Care Management  12/26/2020  Ethan Wells Sep 21, 1971 [id number]   Referral Date: 12/26/20 Referral Source: [employer] Report Date of Discharge: 12/23/20 Facility:  [place]: [facility]   Referral received.  No outreach warranted at this time.  Transition of Care calls being completed via EMMI. RN CM will outreach patient for any red flags received.    Plan: RN CM will close case.    Jone Baseman, RN, MSN [facility] Care Management Care Management Coordinator Direct Line [phone] Toll Free: [phone]  Fax: [phone]

## 2021-01-01 ENCOUNTER — Telehealth: Payer: Medicare HMO | Admitting: Cardiovascular Disease

## 2021-01-09 ENCOUNTER — Other Ambulatory Visit: Payer: Self-pay

## 2021-01-09 ENCOUNTER — Emergency Department: Payer: Medicare HMO

## 2021-01-09 ENCOUNTER — Encounter: Payer: Self-pay | Admitting: Emergency Medicine

## 2021-01-09 DIAGNOSIS — F17210 Nicotine dependence, cigarettes, uncomplicated: Secondary | ICD-10-CM | POA: Diagnosis not present

## 2021-01-09 DIAGNOSIS — I110 Hypertensive heart disease with heart failure: Secondary | ICD-10-CM | POA: Diagnosis not present

## 2021-01-09 DIAGNOSIS — I5022 Chronic systolic (congestive) heart failure: Secondary | ICD-10-CM | POA: Diagnosis not present

## 2021-01-09 DIAGNOSIS — Z79899 Other long term (current) drug therapy: Secondary | ICD-10-CM | POA: Diagnosis not present

## 2021-01-09 DIAGNOSIS — R5381 Other malaise: Secondary | ICD-10-CM | POA: Insufficient documentation

## 2021-01-09 DIAGNOSIS — R109 Unspecified abdominal pain: Secondary | ICD-10-CM | POA: Diagnosis not present

## 2021-01-09 DIAGNOSIS — Z7982 Long term (current) use of aspirin: Secondary | ICD-10-CM | POA: Insufficient documentation

## 2021-01-09 DIAGNOSIS — I2510 Atherosclerotic heart disease of native coronary artery without angina pectoris: Secondary | ICD-10-CM | POA: Insufficient documentation

## 2021-01-09 DIAGNOSIS — R42 Dizziness and giddiness: Secondary | ICD-10-CM | POA: Diagnosis not present

## 2021-01-09 DIAGNOSIS — Z20822 Contact with and (suspected) exposure to covid-19: Secondary | ICD-10-CM | POA: Insufficient documentation

## 2021-01-09 DIAGNOSIS — G4489 Other headache syndrome: Secondary | ICD-10-CM | POA: Diagnosis not present

## 2021-01-09 DIAGNOSIS — J101 Influenza due to other identified influenza virus with other respiratory manifestations: Secondary | ICD-10-CM | POA: Diagnosis not present

## 2021-01-09 DIAGNOSIS — J111 Influenza due to unidentified influenza virus with other respiratory manifestations: Secondary | ICD-10-CM | POA: Diagnosis not present

## 2021-01-09 DIAGNOSIS — R1110 Vomiting, unspecified: Secondary | ICD-10-CM | POA: Diagnosis not present

## 2021-01-09 DIAGNOSIS — R0789 Other chest pain: Secondary | ICD-10-CM | POA: Diagnosis not present

## 2021-01-09 DIAGNOSIS — R000 Tachycardia, unspecified: Secondary | ICD-10-CM | POA: Diagnosis not present

## 2021-01-09 DIAGNOSIS — K760 Fatty (change of) liver, not elsewhere classified: Secondary | ICD-10-CM | POA: Diagnosis not present

## 2021-01-09 DIAGNOSIS — R079 Chest pain, unspecified: Secondary | ICD-10-CM | POA: Diagnosis not present

## 2021-01-09 DIAGNOSIS — I10 Essential (primary) hypertension: Secondary | ICD-10-CM | POA: Diagnosis not present

## 2021-01-09 DIAGNOSIS — R21 Rash and other nonspecific skin eruption: Secondary | ICD-10-CM | POA: Diagnosis not present

## 2021-01-09 DIAGNOSIS — R0602 Shortness of breath: Secondary | ICD-10-CM | POA: Diagnosis not present

## 2021-01-09 DIAGNOSIS — R52 Pain, unspecified: Secondary | ICD-10-CM | POA: Diagnosis not present

## 2021-01-09 LAB — CBC
HCT: 42.1 % (ref 39.0–52.0)
Hemoglobin: 15.2 g/dL (ref 13.0–17.0)
MCH: 36.3 pg — ABNORMAL HIGH (ref 26.0–34.0)
MCHC: 36.1 g/dL — ABNORMAL HIGH (ref 30.0–36.0)
MCV: 100.5 fL — ABNORMAL HIGH (ref 80.0–100.0)
Platelets: 284 10*3/uL (ref 150–400)
RBC: 4.19 MIL/uL — ABNORMAL LOW (ref 4.22–5.81)
RDW: 13.7 % (ref 11.5–15.5)
WBC: 11.7 10*3/uL — ABNORMAL HIGH (ref 4.0–10.5)
nRBC: 0 % (ref 0.0–0.2)

## 2021-01-09 LAB — TROPONIN I (HIGH SENSITIVITY): Troponin I (High Sensitivity): 42 ng/L — ABNORMAL HIGH (ref <18)

## 2021-01-09 IMAGING — CR DG CHEST 2V
1 series · 2 of 2 positions shown · non-contrast
Comparison: Chest radiograph dated [DATE].

CLINICAL DATA: 49-year-old male with chest pain.

EXAM:
CHEST - 2 VIEW

[Series 1: dg chest 2 view · 0.14mm/px · 2 of 2 slices shown]
[im 1/2]
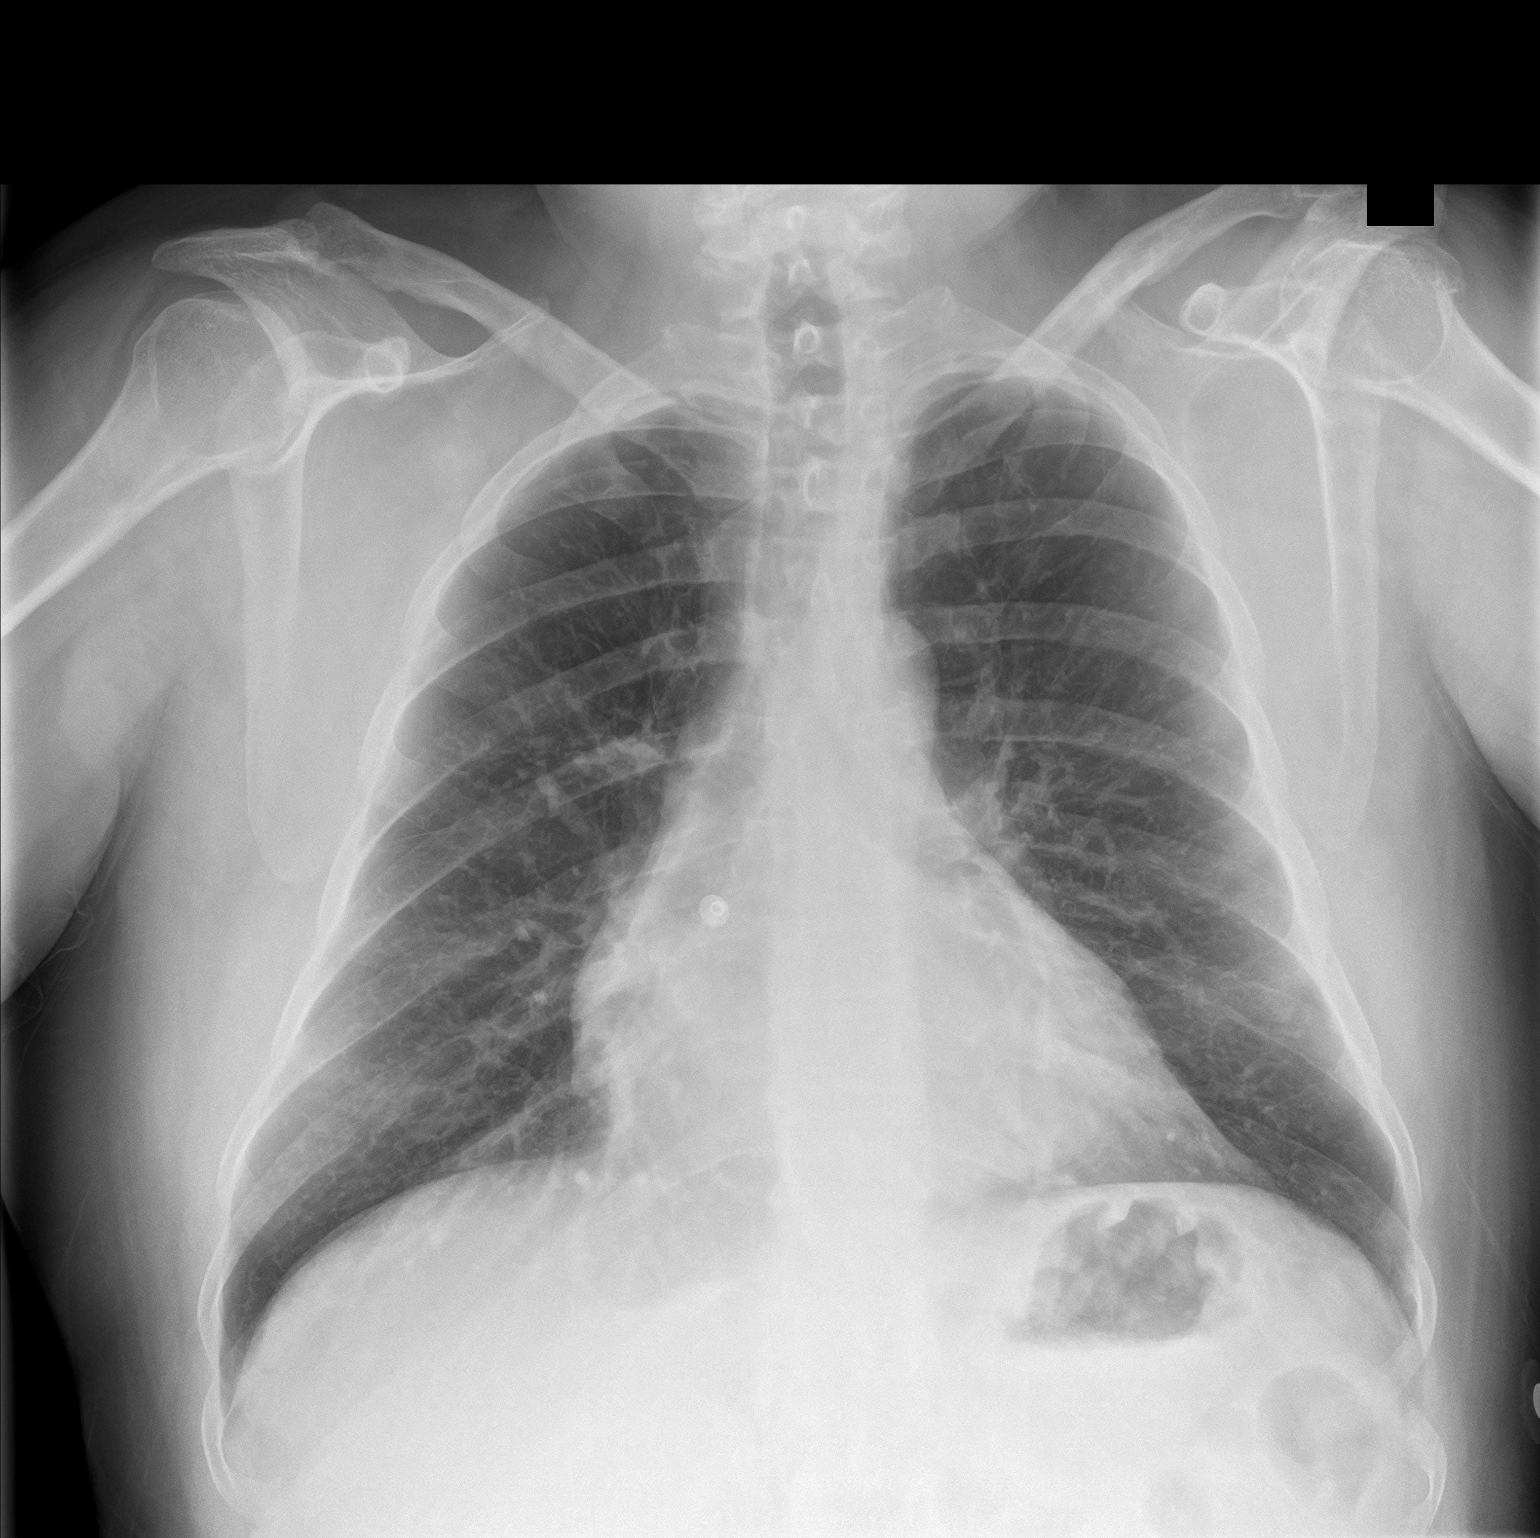
[im 2/2]
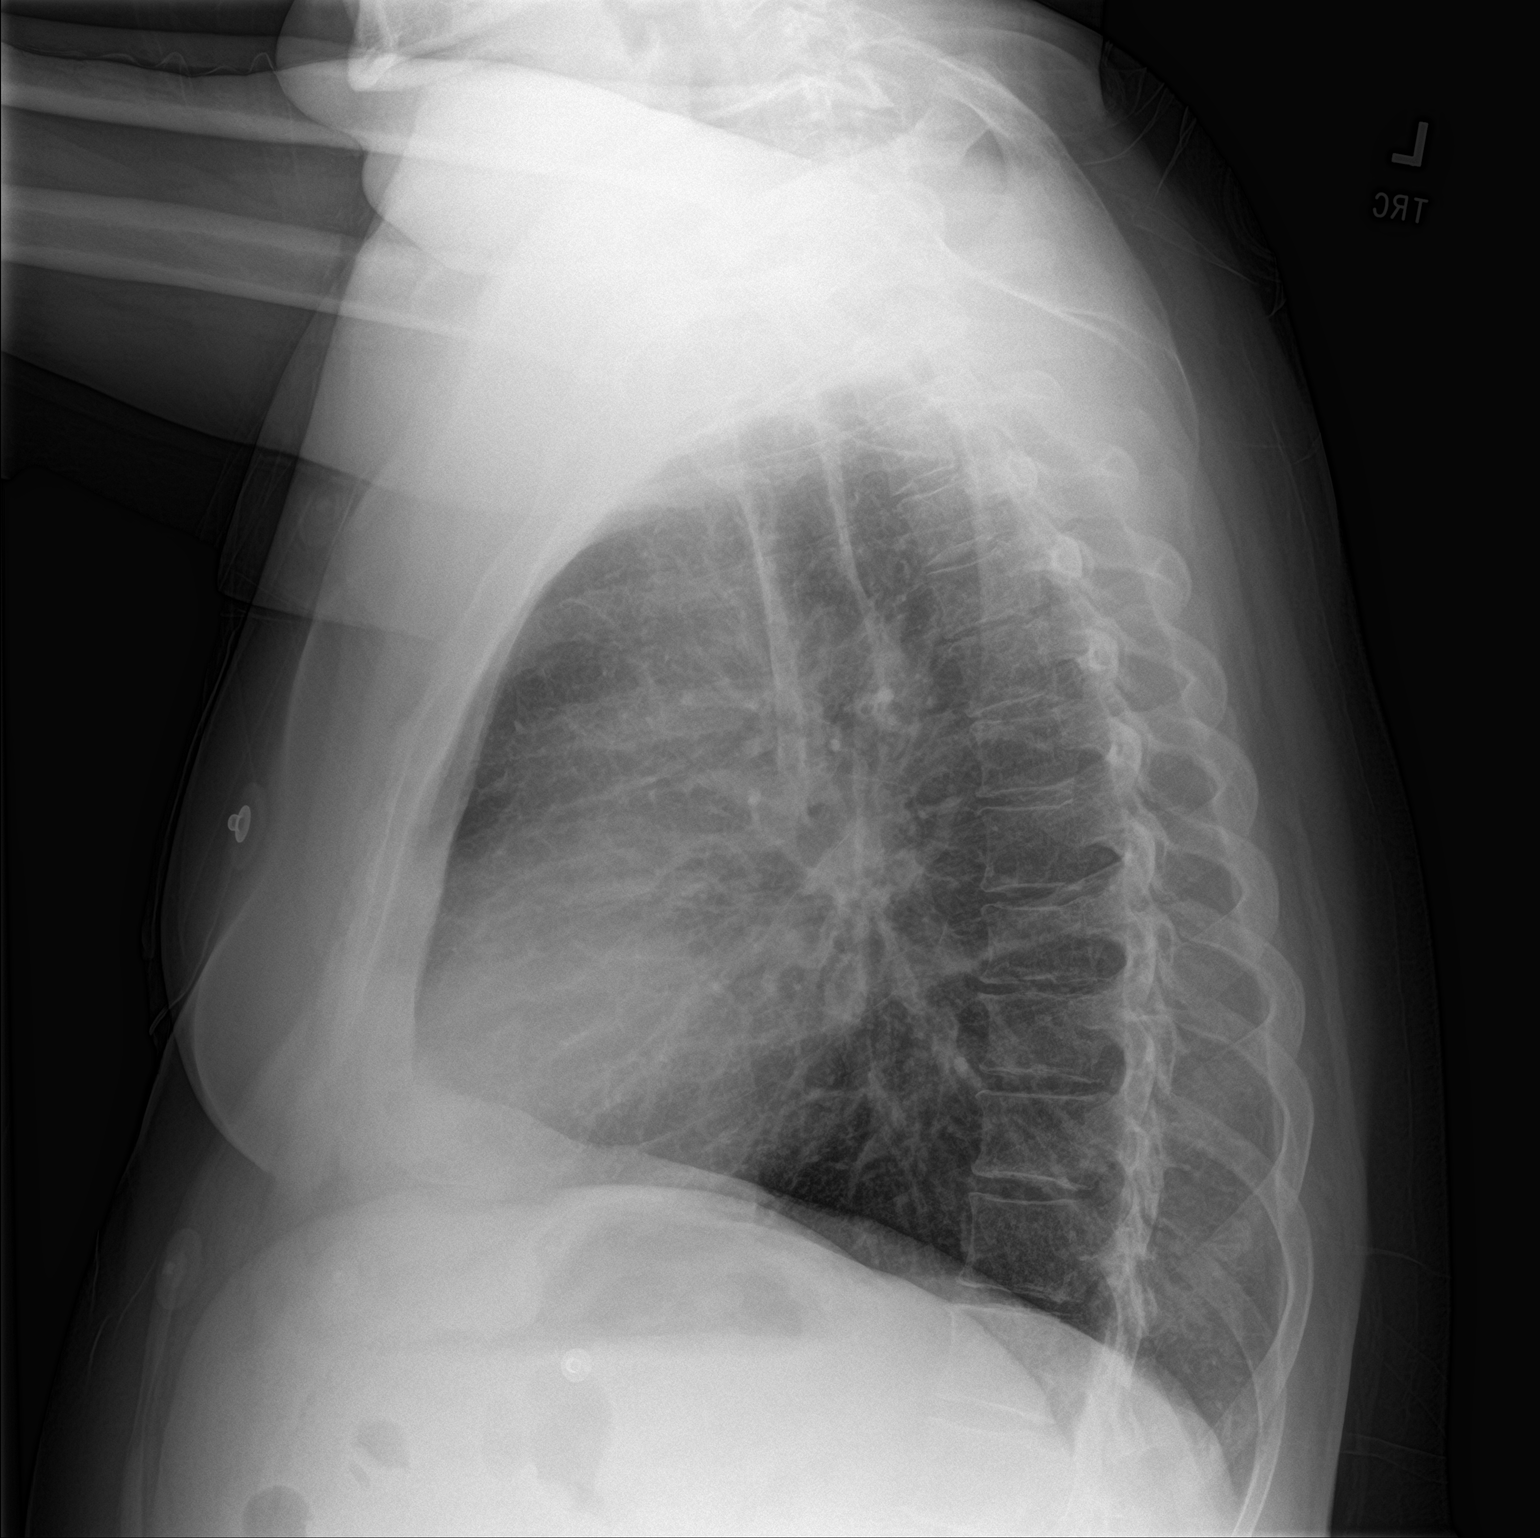

[2 of 2 positions shown; findings below may reference images not displayed]

FINDINGS: The lungs are clear. There is no pleural effusion pneumothorax. The
cardiac silhouette is within limits. No acute osseous pathology.
IMPRESSION: No active cardiopulmonary disease.

## 2021-01-09 NOTE — ED Triage Notes (Signed)
Pt to ED via EMS from home c/o chest pain, abd pain, back pain, headache, and vomiting that started this morning.  Chest pain is dull, SOB, non radiating.  Pt not taking any medications.  Per EMS 179/110 BP, 99% RA, 60-100 HR, 136 CBG.

## 2021-01-10 ENCOUNTER — Emergency Department: Payer: Medicare HMO

## 2021-01-10 ENCOUNTER — Emergency Department
Admission: EM | Admit: 2021-01-10 | Discharge: 2021-01-10 | Disposition: A | Discharge status: Home / Self Care | Payer: Medicare HMO | Attending: Student in an Organized Health Care Education/Training Program | Admitting: Student in an Organized Health Care Education/Training Program

## 2021-01-10 DIAGNOSIS — R0602 Shortness of breath: Secondary | ICD-10-CM | POA: Diagnosis not present

## 2021-01-10 DIAGNOSIS — R109 Unspecified abdominal pain: Secondary | ICD-10-CM | POA: Diagnosis not present

## 2021-01-10 DIAGNOSIS — K760 Fatty (change of) liver, not elsewhere classified: Secondary | ICD-10-CM | POA: Diagnosis not present

## 2021-01-10 DIAGNOSIS — J111 Influenza due to unidentified influenza virus with other respiratory manifestations: Secondary | ICD-10-CM

## 2021-01-10 DIAGNOSIS — R0789 Other chest pain: Secondary | ICD-10-CM

## 2021-01-10 DIAGNOSIS — R079 Chest pain, unspecified: Secondary | ICD-10-CM | POA: Diagnosis not present

## 2021-01-10 DIAGNOSIS — R1110 Vomiting, unspecified: Secondary | ICD-10-CM | POA: Diagnosis not present

## 2021-01-10 DIAGNOSIS — I10 Essential (primary) hypertension: Secondary | ICD-10-CM

## 2021-01-10 DIAGNOSIS — I110 Hypertensive heart disease with heart failure: Secondary | ICD-10-CM | POA: Diagnosis not present

## 2021-01-10 LAB — BASIC METABOLIC PANEL
Anion gap: 18 — ABNORMAL HIGH (ref 5–15)
BUN: <5 mg/dL — ABNORMAL LOW (ref 6–20)
CO2: 19 mmol/L — ABNORMAL LOW (ref 22–32)
Calcium: 9.1 mg/dL (ref 8.9–10.3)
Chloride: 102 mmol/L (ref 98–111)
Creatinine, Ser: 0.6 mg/dL — ABNORMAL LOW (ref 0.61–1.24)
GFR, Estimated: >60 mL/min (ref >60)
Glucose, Bld: 121 mg/dL — ABNORMAL HIGH (ref 70–99)
Potassium: 3.3 mmol/L — ABNORMAL LOW (ref 3.5–5.1)
Sodium: 139 mmol/L (ref 135–145)

## 2021-01-10 LAB — ETHANOL: Alcohol, Ethyl (B): <10 mg/dL (ref <10)

## 2021-01-10 LAB — POC SARS CORONAVIRUS 2 AG -  ED: SARS Coronavirus 2 Ag: NEGATIVE

## 2021-01-10 LAB — HEPATIC FUNCTION PANEL
ALT: 46 U/L — ABNORMAL HIGH (ref 0–44)
AST: 50 U/L — ABNORMAL HIGH (ref 15–41)
Albumin: 4.4 g/dL (ref 3.5–5.0)
Alkaline Phosphatase: 139 U/L — ABNORMAL HIGH (ref 38–126)
Bilirubin, Direct: <0.1 mg/dL (ref 0.0–0.2)
Total Bilirubin: 0.6 mg/dL (ref 0.3–1.2)
Total Protein: 8.6 g/dL — ABNORMAL HIGH (ref 6.5–8.1)

## 2021-01-10 LAB — TROPONIN I (HIGH SENSITIVITY): Troponin I (High Sensitivity): 39 ng/L — ABNORMAL HIGH (ref <18)

## 2021-01-10 LAB — LIPASE, BLOOD: Lipase: 32 U/L (ref 11–51)

## 2021-01-10 IMAGING — CT CT ANGIO CHEST
2 of 6 series · 16 of 46 positions shown · IV contrast (APPLIED)
Comparison: None.

CLINICAL DATA: Chest pain; abdominal pain. Vomiting. Shortness of
breath.

EXAM:
CT ANGIOGRAPHY CHEST
CT ABDOMEN AND PELVIS WITH CONTRAST
TECHNIQUE: Multidetector CT imaging of the chest was performed using the
standard protocol during bolus administration of intravenous
contrast. Multiplanar CT image reconstructions and MIPs were
obtained to evaluate the vascular anatomy. Multidetector CT imaging
of the abdomen and pelvis was performed using the standard protocol
during bolus administration of intravenous contrast.
CONTRAST:  75mL OMNIPAQUE IOHEXOL 350 MG/ML SOLN

[Series 5: thins · axial · 0.69mm/px · z∈[-310,-71]mm · 13 of 263 slices shown]
[im 12/263  lung]
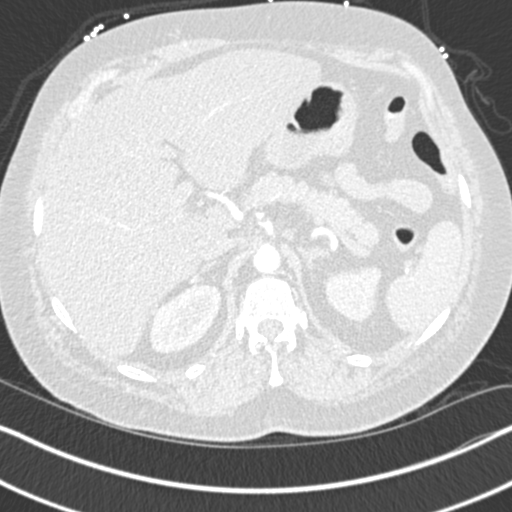
[im 35/263  soft-tissue]
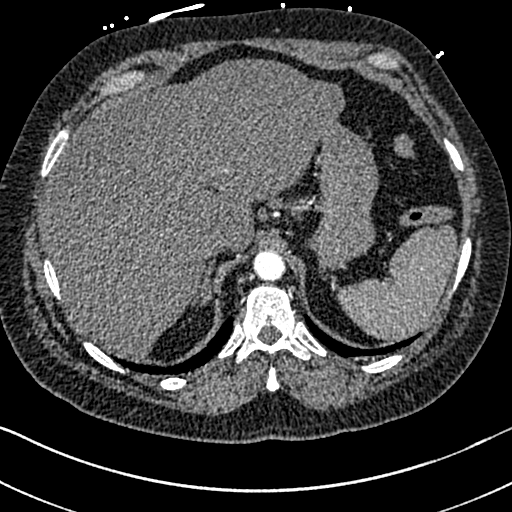
[im 57/263  lung]
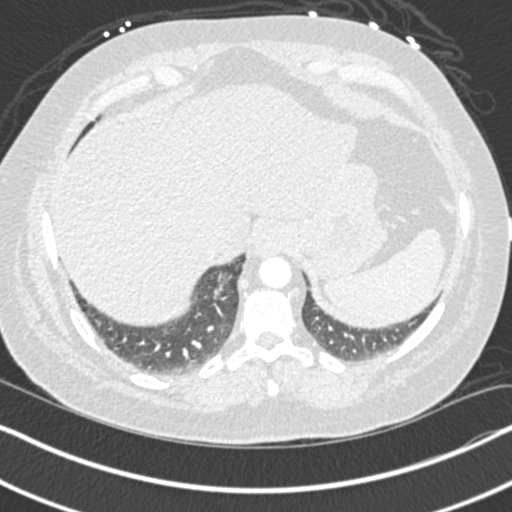
[im 69/263  soft-tissue]
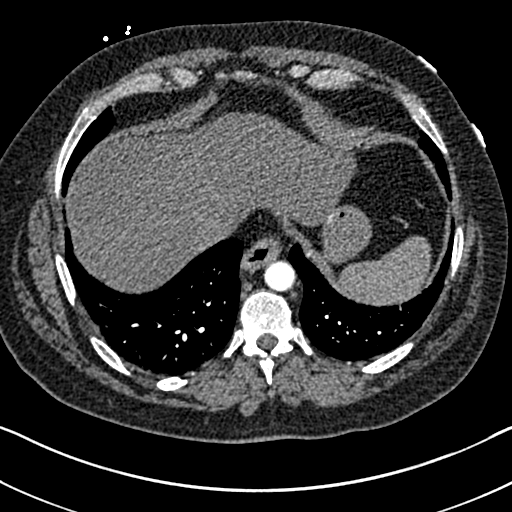
[im 92/263  lung]
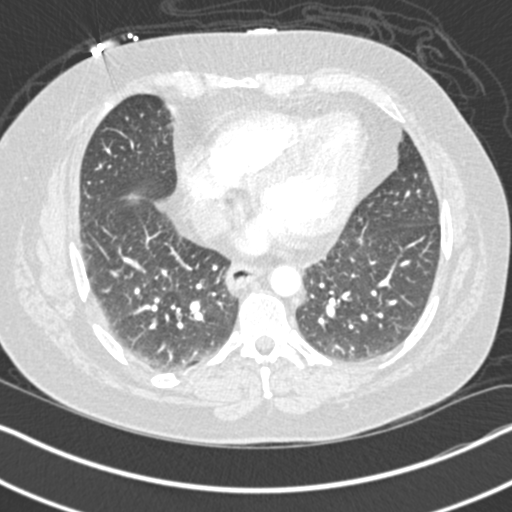
[im 114/263  soft-tissue]
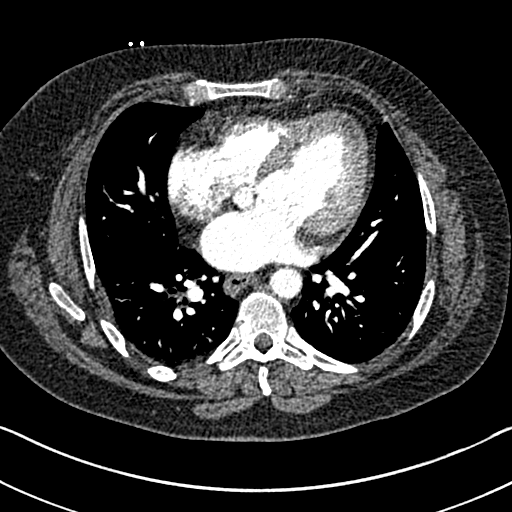
[im 137/263  lung]
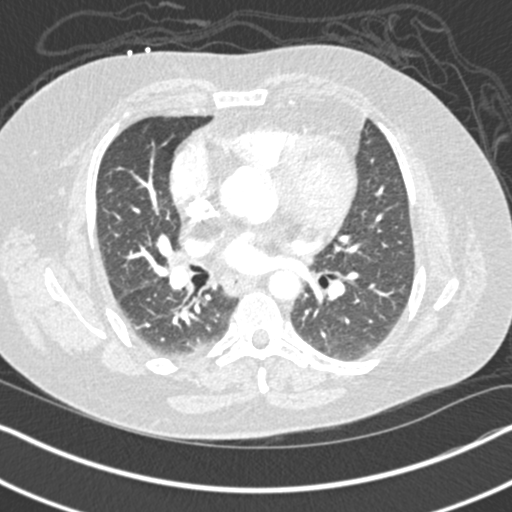
[im 149/263  soft-tissue]
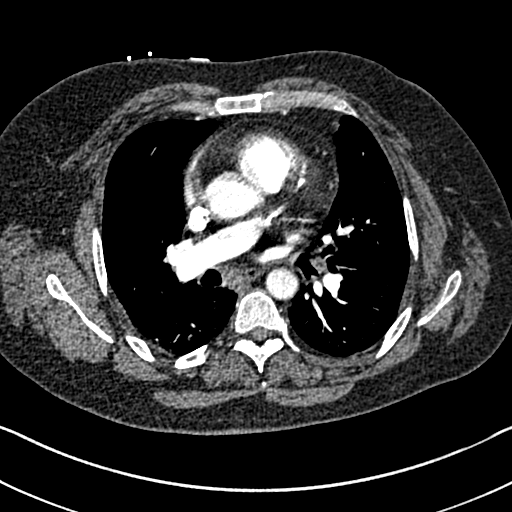
[im 171/263  lung]
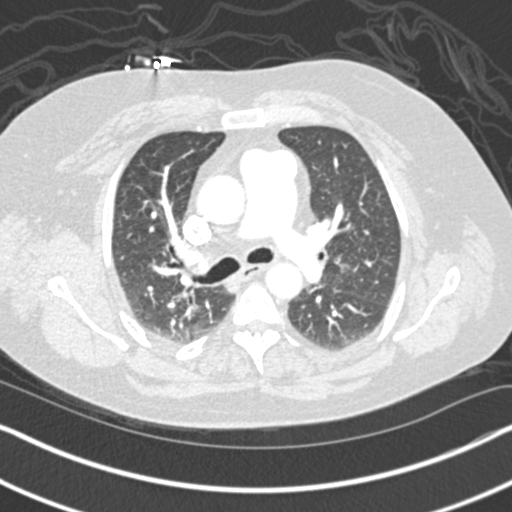
[im 194/263  soft-tissue]
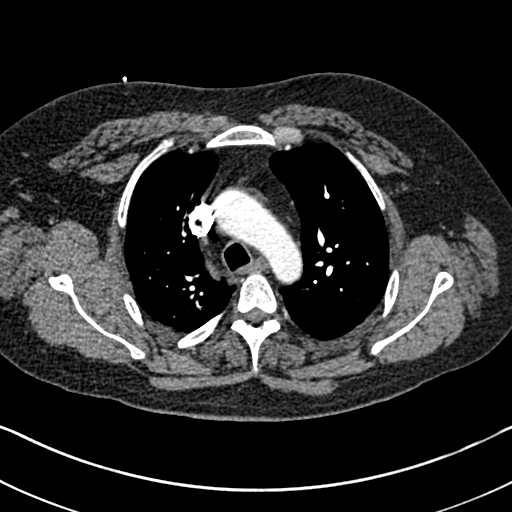
[im 206/263  lung]
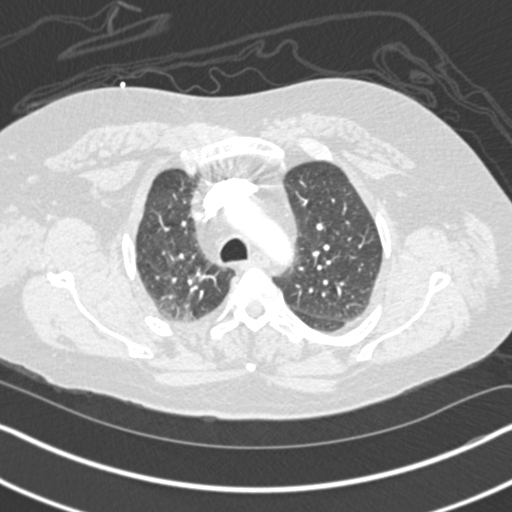
[im 228/263  soft-tissue]
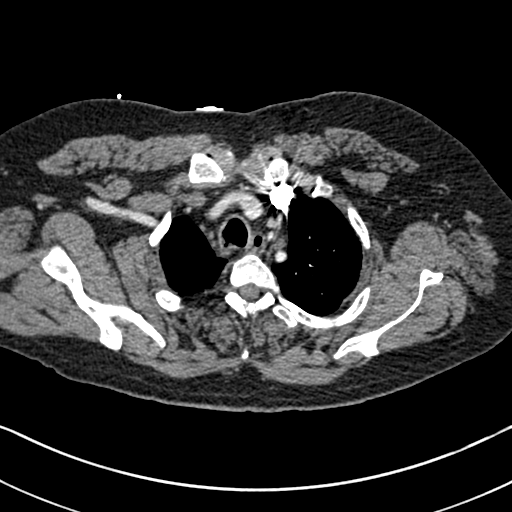
[im 251/263  lung]
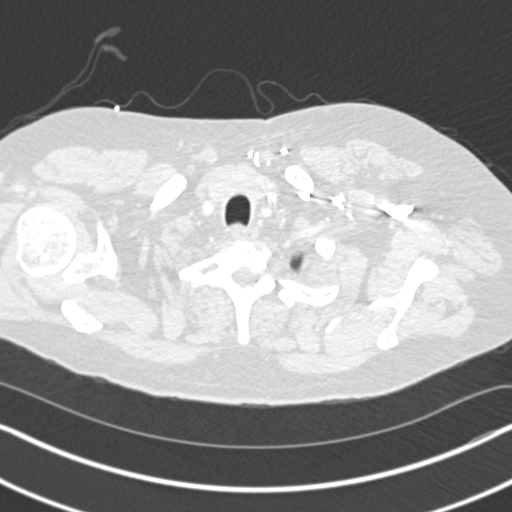

[Series 7: coronal mpr · coronal · 0.52mm/px · 3 of 147 slices shown]
[im 37/147  soft-tissue]
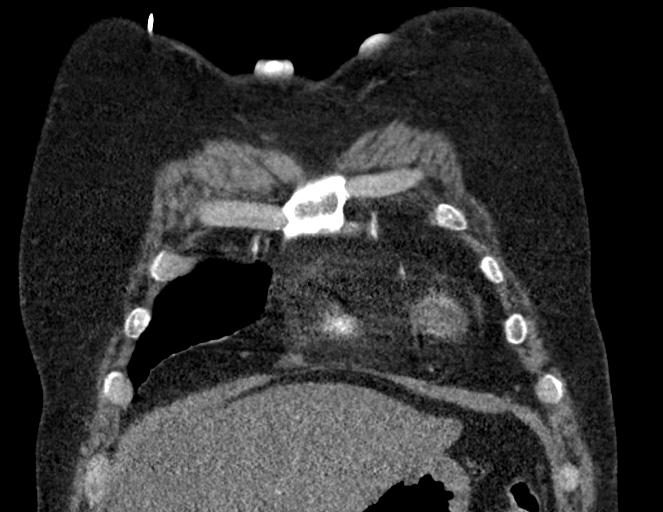
[im 74/147  soft-tissue]
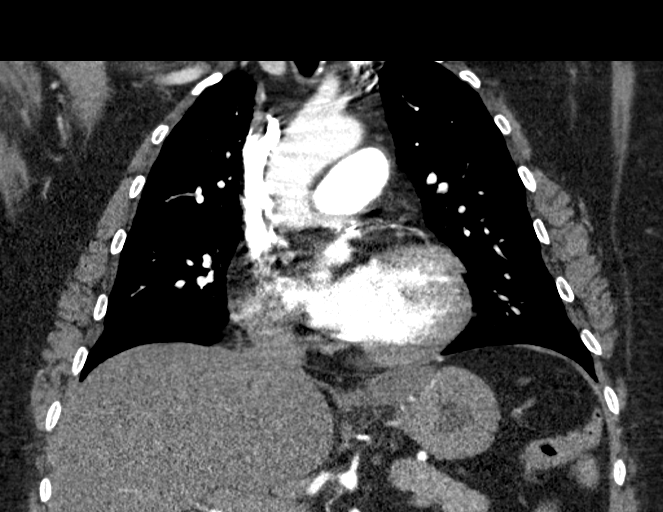
[im 110/147  soft-tissue]
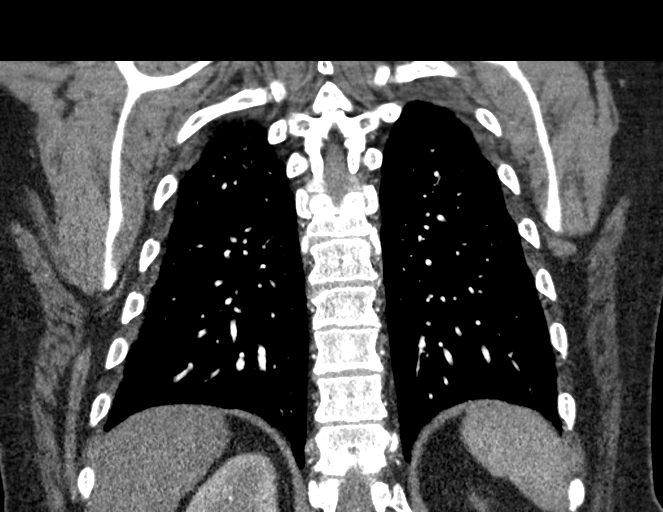

[16 of 46 positions shown; findings below may reference images not displayed]

CT angiogram chest [DATE]; CT angiogram abdomen and pelvis
[DATE]; chest radiograph [DATE]
FINDINGS: CTA CHEST FINDINGS

Cardiovascular: There is no demonstrable pulmonary embolus. There is
no appreciable thoracic aortic aneurysm or dissection. Visualized
great vessels appear unremarkable. The left vertebral artery arises
directly off the aorta, an anatomic variant. There is a stent in the
left anterior descending coronary artery. There is no pericardial
effusion or pericardial thickening.

The main pulmonary outflow tract measures 3.4 cm in diameter.

Mediastinum/Nodes: No thyroid lesions. No evident thoracic
adenopathy. No esophageal lesions evident.

Lungs/Pleura: No edema or airspace opacity. No pleural effusions. No
pneumothorax. Trachea and major bronchial structures appear patent.

Musculoskeletal: There are no blastic or lytic bone lesions. No
chest wall lesions are evident.

Review of the MIP images confirms the above findings.

CT ABDOMEN and PELVIS FINDINGS

Hepatobiliary: There is a degree of hepatic steatosis. No focal
liver lesions are evident. Gallbladder wall is not appreciably
thickened. There is no biliary duct dilatation.

Pancreas: There is no pancreatic mass or inflammatory focus.

Spleen: No splenic lesions are evident.

Adrenals/Urinary Tract: Adrenals bilaterally appear normal. No
evident renal mass or hydronephrosis on either side. No renal or
ureteral calculus on either side. Urinary bladder is midline with
wall thickness within normal limits.

Stomach/Bowel: No evident bowel wall or mesenteric thickening.
Terminal ileum appears normal. No evident bowel obstruction. No free
air or portal venous air.

Vascular/Lymphatic: No abdominal aortic aneurysm. There is
atherosclerotic calcification in the aorta and common iliac
arteries. Major venous structures appear patent. There is no evident
adenopathy in the abdomen or pelvis.

Reproductive: Prostate and seminal vesicles appear normal in size
and contour.

Other: There is no periappendiceal region inflammatory change. No
abscess or ascites evident in the abdomen or pelvis.

Musculoskeletal: No blastic or lytic bone lesions. No intramuscular
or abdominal wall lesions.

Review of the MIP images confirms the above findings.
IMPRESSION: CT angiogram chest:

1. No evident pulmonary embolus. No thoracic aortic aneurysm or
dissection.

2. Prominence of the main pulmonary outflow tract, a finding
indicative of pulmonary arterial hypertension.

3.  Lungs clear.  No pleural effusions.

4.  No evident adenopathy.

CT abdomen and pelvis:

1.  Aortic Atherosclerosis ([UG]-[UG]).  No evident aneurysm.

2. No appreciable bowel wall thickening or bowel obstruction. No
abscess in the abdomen or pelvis. No periappendiceal region
inflammatory change.

3. No evident renal or ureteral calculus. No hydronephrosis. Urinary
bladder wall thickness normal.

4.  Hepatic steatosis.

## 2021-01-10 IMAGING — CT CT ABD-PELV W/ CM
2 of 5 series · 15 of 46 positions shown, 17 images · IV contrast (APPLIED)
Comparison: None.

CLINICAL DATA: Chest pain; abdominal pain. Vomiting. Shortness of
breath.

EXAM:
CT ANGIOGRAPHY CHEST
CT ABDOMEN AND PELVIS WITH CONTRAST
TECHNIQUE: Multidetector CT imaging of the chest was performed using the
standard protocol during bolus administration of intravenous
contrast. Multiplanar CT image reconstructions and MIPs were
obtained to evaluate the vascular anatomy. Multidetector CT imaging
of the abdomen and pelvis was performed using the standard protocol
during bolus administration of intravenous contrast.
CONTRAST:  75mL OMNIPAQUE IOHEXOL 350 MG/ML SOLN

[Series 2: axial st · axial · 0.75mm/px · z∈[-664,-240]mm · 12 of 97 slices shown, 14 images]
[im 6/97  soft-tissue]
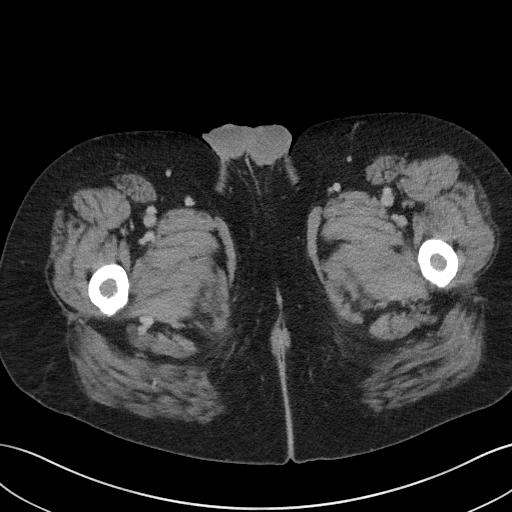
[im 6/97  bone]
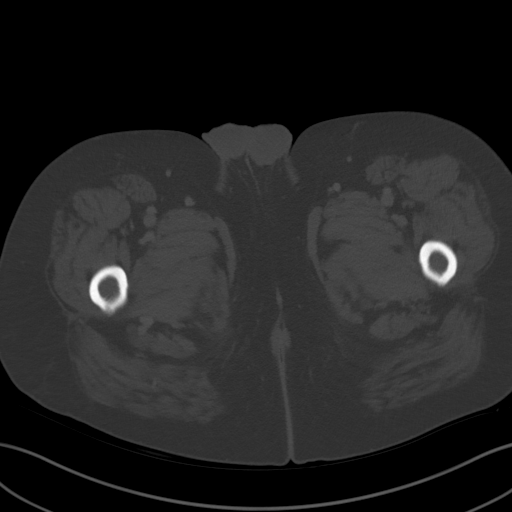
[im 16/97  soft-tissue]
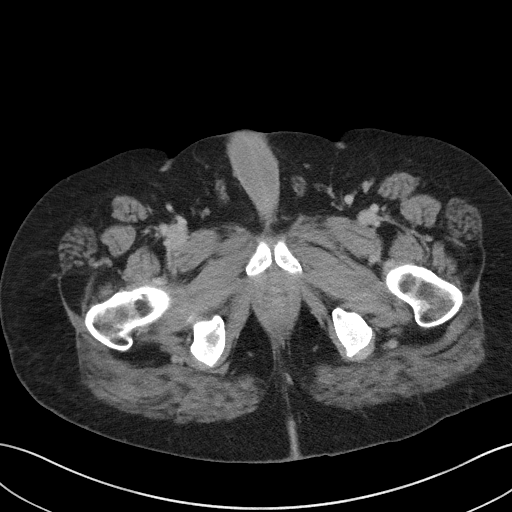
[im 21/97  soft-tissue]
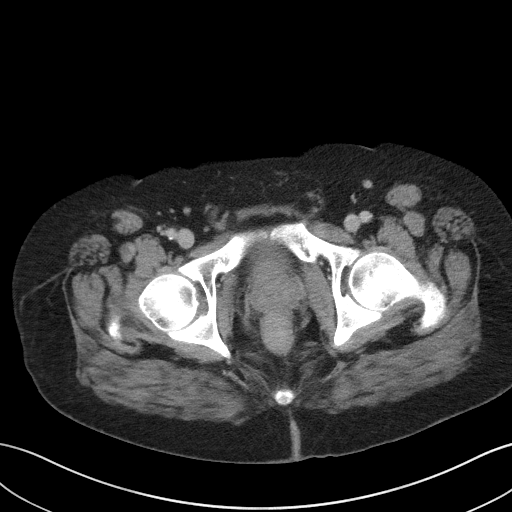
[im 31/97  soft-tissue]
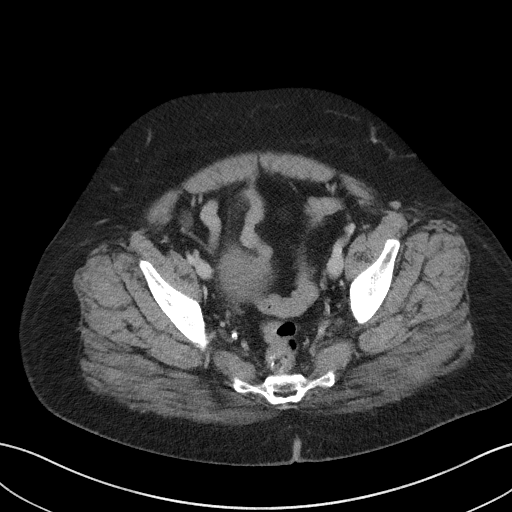
[im 36/97  soft-tissue]
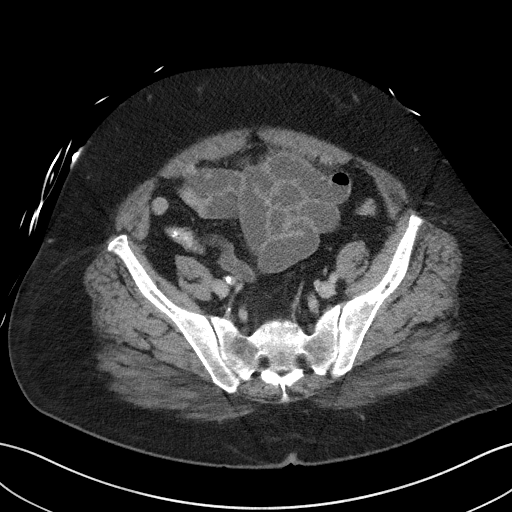
[im 46/97  soft-tissue]
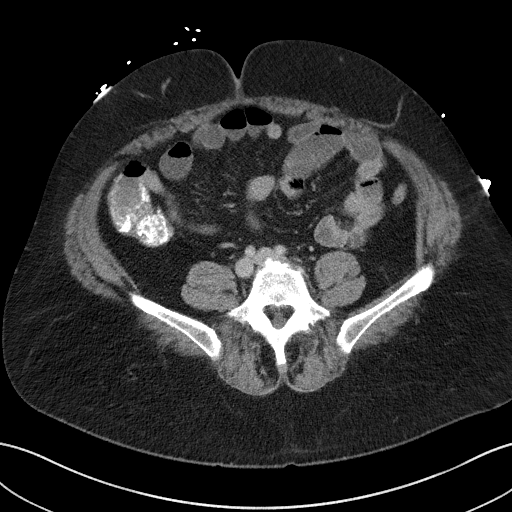
[im 51/97  soft-tissue]
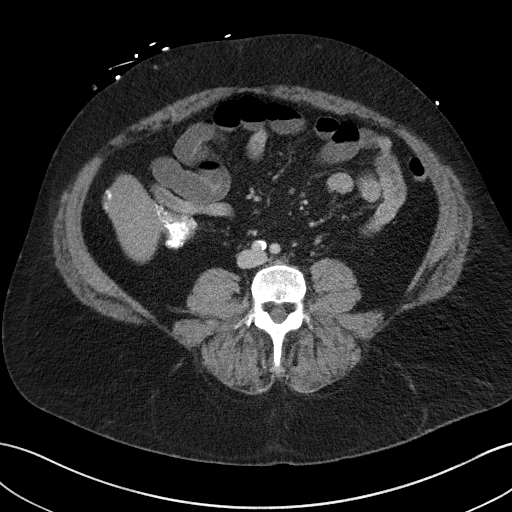
[im 61/97  soft-tissue]
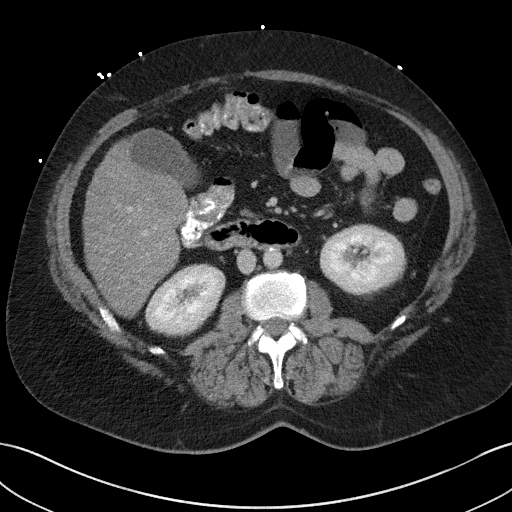
[im 66/97  soft-tissue]
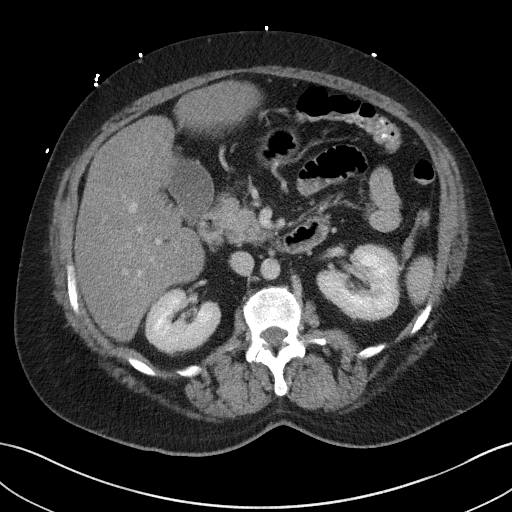
[im 66/97  bone]
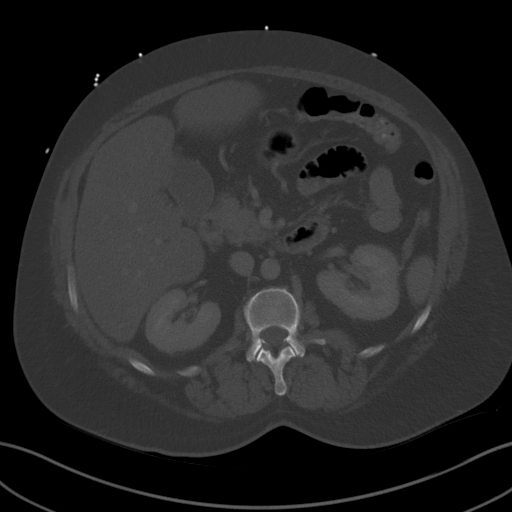
[im 76/97  soft-tissue]
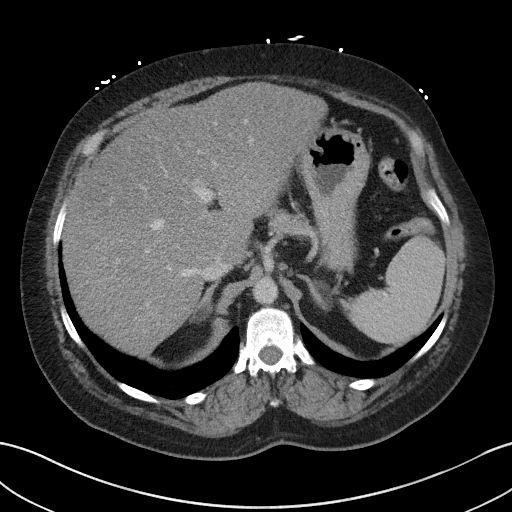
[im 81/97  soft-tissue]
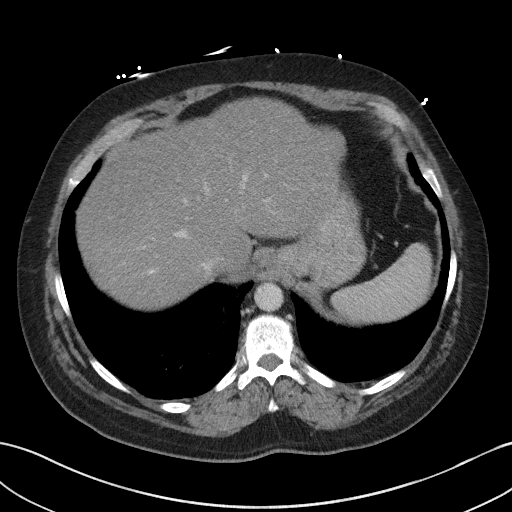
[im 91/97  soft-tissue]
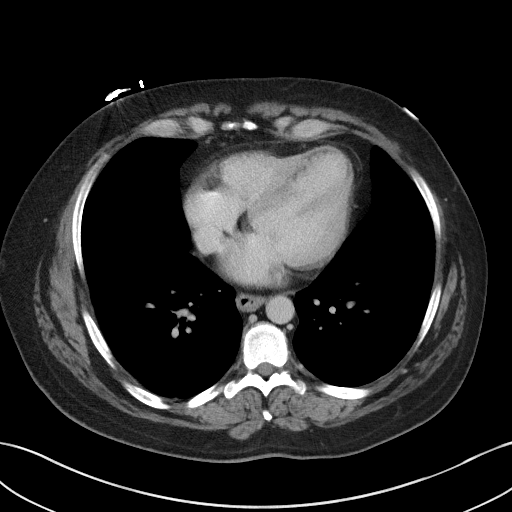

[Series 5: coronal st · coronal · 0.80mm/px · 3 of 102 slices shown]
[im 34/102  soft-tissue]
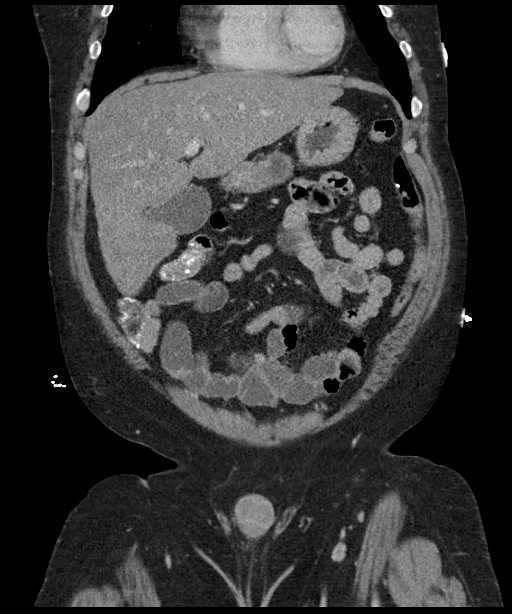
[im 45/102  soft-tissue]
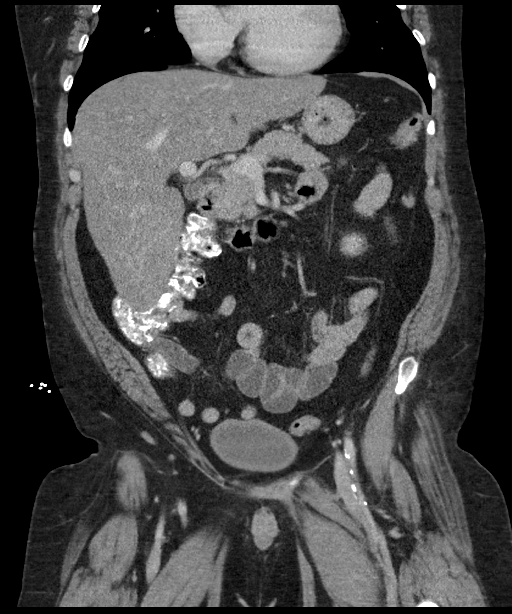
[im 57/102  soft-tissue]
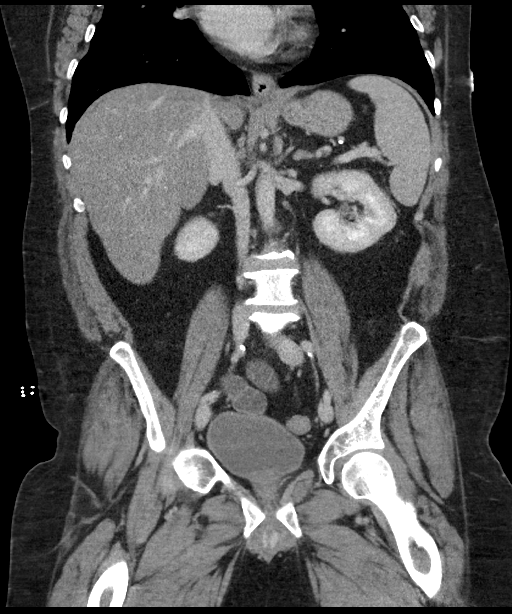

[15 of 46 positions shown; findings below may reference images not displayed]

CT angiogram chest [DATE]; CT angiogram abdomen and pelvis
[DATE]; chest radiograph [DATE]
FINDINGS: CTA CHEST FINDINGS

Cardiovascular: There is no demonstrable pulmonary embolus. There is
no appreciable thoracic aortic aneurysm or dissection. Visualized
great vessels appear unremarkable. The left vertebral artery arises
directly off the aorta, an anatomic variant. There is a stent in the
left anterior descending coronary artery. There is no pericardial
effusion or pericardial thickening.

The main pulmonary outflow tract measures 3.4 cm in diameter.

Mediastinum/Nodes: No thyroid lesions. No evident thoracic
adenopathy. No esophageal lesions evident.

Lungs/Pleura: No edema or airspace opacity. No pleural effusions. No
pneumothorax. Trachea and major bronchial structures appear patent.

Musculoskeletal: There are no blastic or lytic bone lesions. No
chest wall lesions are evident.

Review of the MIP images confirms the above findings.

CT ABDOMEN and PELVIS FINDINGS

Hepatobiliary: There is a degree of hepatic steatosis. No focal
liver lesions are evident. Gallbladder wall is not appreciably
thickened. There is no biliary duct dilatation.

Pancreas: There is no pancreatic mass or inflammatory focus.

Spleen: No splenic lesions are evident.

Adrenals/Urinary Tract: Adrenals bilaterally appear normal. No
evident renal mass or hydronephrosis on either side. No renal or
ureteral calculus on either side. Urinary bladder is midline with
wall thickness within normal limits.

Stomach/Bowel: No evident bowel wall or mesenteric thickening.
Terminal ileum appears normal. No evident bowel obstruction. No free
air or portal venous air.

Vascular/Lymphatic: No abdominal aortic aneurysm. There is
atherosclerotic calcification in the aorta and common iliac
arteries. Major venous structures appear patent. There is no evident
adenopathy in the abdomen or pelvis.

Reproductive: Prostate and seminal vesicles appear normal in size
and contour.

Other: There is no periappendiceal region inflammatory change. No
abscess or ascites evident in the abdomen or pelvis.

Musculoskeletal: No blastic or lytic bone lesions. No intramuscular
or abdominal wall lesions.

Review of the MIP images confirms the above findings.
IMPRESSION: CT angiogram chest:

1. No evident pulmonary embolus. No thoracic aortic aneurysm or
dissection.

2. Prominence of the main pulmonary outflow tract, a finding
indicative of pulmonary arterial hypertension.

3.  Lungs clear.  No pleural effusions.

4.  No evident adenopathy.

CT abdomen and pelvis:

1.  Aortic Atherosclerosis ([UG]-[UG]).  No evident aneurysm.

2. No appreciable bowel wall thickening or bowel obstruction. No
abscess in the abdomen or pelvis. No periappendiceal region
inflammatory change.

3. No evident renal or ureteral calculus. No hydronephrosis. Urinary
bladder wall thickness normal.

4.  Hepatic steatosis.

## 2021-01-10 MED ORDER — OXYCODONE-ACETAMINOPHEN 5-325 MG PO TABS
1.0000 | ORAL_TABLET | Freq: Once | ORAL | Status: AC
  Administered 2021-01-10: 1 via ORAL
  Filled 2021-01-10: qty 1

## 2021-01-10 MED ORDER — IPRATROPIUM-ALBUTEROL 0.5-2.5 (3) MG/3ML IN SOLN
3.0000 mL | Freq: Once | RESPIRATORY_TRACT | Status: AC
  Administered 2021-01-10: 3 mL via RESPIRATORY_TRACT
  Filled 2021-01-10: qty 3

## 2021-01-10 MED ORDER — OXYCODONE-ACETAMINOPHEN 5-325 MG PO TABS: 1.0000 | ORAL_TABLET | ORAL | 0 refills | Status: DC | PRN

## 2021-01-10 MED ORDER — ASPIRIN 81 MG PO CHEW
324.0000 mg | CHEWABLE_TABLET | Freq: Once | ORAL | Status: AC
  Administered 2021-01-10: 324 mg via ORAL
  Filled 2021-01-10: qty 4

## 2021-01-10 MED ORDER — ONDANSETRON HCL 4 MG/2ML IJ SOLN
4.0000 mg | Freq: Once | INTRAMUSCULAR | Status: AC
  Administered 2021-01-10: 4 mg via INTRAVENOUS
  Filled 2021-01-10: qty 2

## 2021-01-10 MED ORDER — ONDANSETRON HCL 4 MG/2ML IJ SOLN: 4.0000 mg | Freq: Once | INTRAMUSCULAR | Status: DC

## 2021-01-10 MED ORDER — CARVEDILOL 6.25 MG PO TABS
6.2500 mg | ORAL_TABLET | Freq: Two times a day (BID) | ORAL | Status: DC
  Administered 2021-01-10: 6.25 mg via ORAL
  Filled 2021-01-10: qty 1

## 2021-01-10 MED ORDER — MORPHINE SULFATE (PF) 4 MG/ML IV SOLN
4.0000 mg | INTRAVENOUS | Status: DC | PRN
  Administered 2021-01-10: 4 mg via INTRAVENOUS
  Filled 2021-01-10: qty 1

## 2021-01-10 MED ORDER — IOHEXOL 350 MG/ML SOLN
75.0000 mL | Freq: Once | INTRAVENOUS | Status: AC | PRN
  Administered 2021-01-10: 75 mL via INTRAVENOUS
  Filled 2021-01-10: qty 75

## 2021-01-10 NOTE — ED Notes (Signed)
Patient transported to CT 

## 2021-01-10 NOTE — ED Provider Notes (Signed)
[facility] Emergency Department Provider Note    Event Date/Time   First MD Initiated Contact with Patient 01/10/21 0813     (approximate)  I have reviewed the triage vital signs and the nursing notes.   HISTORY  Chief Complaint Chest Pain, Vomiting, Abdominal Pain, Headache, and Back Pain    HPI Ethan Wells is a 50 y.o. male the below listed past medical history presents to the ER for evaluation of abdominal pain nausea vomiting chest pain shortness of breath headache and generalized malaise over the past several days.  Does have extensive past medical history and long history of noncompliance.  Not on any blood thinners.  No known sick contacts.   Past Medical History:  Diagnosis Date  . CAD (coronary artery disease)    a. 01/2011 Anterior STEMI/Cath/PCI: LM nl, LAD 100d (3.5x27mm Vision BMS placed), LCX 13m, RI 50, RCA min irregs, EF 40% w/ apical, inferoapical HK.  . Cardiac arrest - ventricular fibrillation    a. In setting of STEMI 01/2011  . Cocaine abuse ([place])   . ETOH abuse    a. 6-12 beers/day  . Hemorrhoids   . Hypertension   . Ischemic cardiomyopathy    a. 06/2011 Echo: EF 45-50%, No rwma  . Marijuana abuse   . Tobacco abuse    a. 1/2 ppd x 26 yrs   Family History  Problem Relation Age of Onset  . Coronary artery disease Mother        alive  . Other Other        2 sisters alive and well  . Alcohol abuse Father        died @ 60   Past Surgical History:  Procedure Laterality Date  . CORONARY ANGIOPLASTY WITH STENT PLACEMENT     Patient Active Problem List   Diagnosis Date Noted  . Elevated troponin   . Notalgia   . NSTEMI (non-ST elevated myocardial infarction) ([place]) 12/19/2020  . History of ST elevation myocardial infarction (STEMI) 12/19/2020  . Alcohol use disorder, moderate, dependence ([place]) 12/19/2020  . Hyponatremia 06/04/2020  . Hypomagnesemia 06/04/2020  . Chronic systolic CHF (congestive heart failure)  ([place]) 06/04/2020  . Leukocytosis 06/04/2020  . Abdominal pain 06/04/2020  . Hypokalemia 06/04/2020  . Ischemic cardiomyopathy   . Polysubstance abuse ([place])   . Chest pain 07/12/2019  . Abdominal pain, RUQ 05/25/2018  . Chest pain, mid sternal 11/17/2012  . Cocaine abuse ([place])   . Marijuana abuse   . TOBACCO ABUSE 02/26/2011  . HYPERTENSION, BENIGN 02/26/2011  . CAD, NATIVE VESSEL 02/26/2011  . Other specified forms of chronic ischemic heart disease 02/26/2011      Prior to Admission medications   Medication Sig Start Date End Date Taking? Authorizing Provider  oxyCODONE-acetaminophen (PERCOCET) 5-325 MG tablet Take 1 tablet by mouth every 4 (four) hours as needed for severe pain. 01/10/21 01/10/22 Yes Merlyn Lot, MD  albuterol (VENTOLIN HFA) 108 (90 Base) MCG/ACT inhaler Inhale 2 puffs into the lungs every 4 (four) hours as needed for wheezing or shortness of breath. 12/09/20   Paulette Blanch, MD  aspirin 81 MG chewable tablet Chew 1 tablet (81 mg total) by mouth daily. 06/07/20   Fritzi Mandes, MD  carvedilol (COREG) 6.25 MG tablet Take 6.25 mg by mouth 2 (two) times daily. 10/05/20   [provider]  folic acid (FOLVITE) 1 MG tablet Take 1 tablet (1 mg total) by mouth daily. 06/07/20   Fritzi Mandes, MD  gabapentin (NEURONTIN) 300 MG capsule Take 600 mg by mouth at bedtime. 11/29/20   [provider]  isosorbide mononitrate (IMDUR) 30 MG 24 hr tablet Take 30 mg by mouth daily. 10/05/20   [provider]  lisinopril (ZESTRIL) 2.5 MG tablet Take 1 tablet (2.5 mg total) by mouth daily. 12/24/20   Pahwani, Michell Heinrich, MD  Multiple Vitamin (MULTIVITAMIN WITH MINERALS) TABS tablet Take 1 tablet by mouth daily. 06/07/20   Fritzi Mandes, MD  nitroGLYCERIN (NITROSTAT) 0.4 MG SL tablet Place 1 tablet (0.4 mg total) under the tongue every 5 (five) minutes as needed for chest pain. 06/06/20   Fritzi Mandes, MD  Omega-3 Fatty Acids (FISH OIL) 1000 MG CAPS Take 2 capsules by mouth  daily.    [provider]  orphenadrine (NORFLEX) 100 MG tablet Take 1 tablet (100 mg total) by mouth 2 (two) times daily. 12/24/20   Sable Feil, PA-C  pantoprazole (PROTONIX) 40 MG tablet Take 1 tablet (40 mg total) by mouth daily at 12 noon. 06/06/20   Fritzi Mandes, MD  rosuvastatin (CRESTOR) 40 MG tablet Take 40 mg by mouth daily. 11/14/20   [provider]  thiamine 100 MG tablet Take 1 tablet (100 mg total) by mouth daily. 06/07/20   Fritzi Mandes, MD  traMADol (ULTRAM) 50 MG tablet Take 1 tablet (50 mg total) by mouth every 6 (six) hours as needed for severe pain. 12/23/20   Pahwani, Michell Heinrich, MD  vitamin E 45 MG (100 UNITS) capsule Take 200 Units by mouth daily.    [provider]    Allergies Penicillins    Social History Social History   Tobacco Use  . Smoking status: Current Every Day Smoker    Packs/day: 0.50    Years: 26.00    Pack years: 13.00    Types: Cigarettes  . Smokeless tobacco: Never Used  Vaping Use  . Vaping Use: Never used  Substance Use Topics  . Alcohol use: Yes    Comment: at least a 6 pack of beer daily and often a 12 pack  . Drug use: Not Currently    Types: Cocaine, Marijuana    Comment:  cocaine used several weeks ago; marijuana today    Review of Systems Patient denies headaches, rhinorrhea, blurry vision, numbness, shortness of breath, chest pain, edema, cough, abdominal pain, nausea, vomiting, diarrhea, dysuria, fevers, rashes or hallucinations unless otherwise stated above in HPI. ____________________________________________   PHYSICAL EXAM:  VITAL SIGNS: Vitals:   01/10/21 0926 01/10/21 1018  BP: (!) 171/82 (!) 154/100  Pulse: (!) 105 97  Resp: 20   Temp:    SpO2: 98%     Constitutional: Alert and oriented, nontoxic appearing.  Eyes: Conjunctivae are normal.  Head: Atraumatic. Nose: No congestion/rhinnorhea. Mouth/Throat: Mucous membranes are moist.   Neck: No stridor. Painless ROM.  Cardiovascular:  Normal rate, regular rhythm. Grossly normal heart sounds.  Good peripheral circulation. Respiratory: Normal respiratory effort.  No retractions. Lungswith coarse breathsounds throughout Gastrointestinal: Soft but with generalized ttp. No distention. No abdominal bruits. No CVA tenderness. Genitourinary:  Musculoskeletal: No lower extremity tenderness nor edema.  No joint effusions. Neurologic:  Normal speech and language. No gross focal neurologic deficits are appreciated. No facial droop Skin:  Skin is warm, dry and intact. No rash noted. Psychiatric: Mood and affect are normal. Speech and behavior are normal.  ____________________________________________   LABS (all labs ordered are listed, but only abnormal results are displayed)  Results for orders placed or  performed during the hospital encounter of 01/10/21 (from the past 24 hour(s))  CBC     Status: Abnormal   Collection Time: 01/09/21 10:06 PM  Result Value Ref Range   WBC 11.7 (H) 4.0 - 10.5 K/uL   RBC 4.19 (L) 4.22 - 5.81 MIL/uL   Hemoglobin 15.2 13.0 - 17.0 g/dL   HCT 42.1 39.0 - 52.0 %   MCV 100.5 (H) 80.0 - 100.0 fL   MCH 36.3 (H) 26.0 - 34.0 pg   MCHC 36.1 (H) 30.0 - 36.0 g/dL   RDW 13.7 11.5 - 15.5 %   Platelets 284 150 - 400 K/uL   nRBC 0.0 0.0 - 0.2 %  Troponin I (High Sensitivity)     Status: Abnormal   Collection Time: 01/09/21 10:06 PM  Result Value Ref Range   Troponin I (High Sensitivity) 42 (H) <18 ng/L  Basic metabolic panel     Status: Abnormal   Collection Time: 01/09/21 11:29 PM  Result Value Ref Range   Sodium 139 135 - 145 mmol/L   Potassium 3.3 (L) 3.5 - 5.1 mmol/L   Chloride 102 98 - 111 mmol/L   CO2 19 (L) 22 - 32 mmol/L   Glucose, Bld 121 (H) 70 - 99 mg/dL   BUN <5 (L) 6 - 20 mg/dL   Creatinine, Ser 0.60 (L) 0.61 - 1.24 mg/dL   Calcium 9.1 8.9 - 10.3 mg/dL   GFR, Estimated >60 >60 mL/min   Anion gap 18 (H) 5 - 15  Troponin I (High Sensitivity)     Status: Abnormal   Collection Time:  01/09/21 11:29 PM  Result Value Ref Range   Troponin I (High Sensitivity) 39 (H) <18 ng/L  Ethanol     Status: None   Collection Time: 01/10/21  1:52 AM  Result Value Ref Range   Alcohol, Ethyl (B) <10 <10 mg/dL  Lipase, blood     Status: None   Collection Time: 01/10/21  1:52 AM  Result Value Ref Range   Lipase 32 11 - 51 U/L  Hepatic function panel     Status: Abnormal   Collection Time: 01/10/21  1:52 AM  Result Value Ref Range   Total Protein 8.6 (H) 6.5 - 8.1 g/dL   Albumin 4.4 3.5 - 5.0 g/dL   AST 50 (H) 15 - 41 U/L   ALT 46 (H) 0 - 44 U/L   Alkaline Phosphatase 139 (H) 38 - 126 U/L   Total Bilirubin 0.6 0.3 - 1.2 mg/dL   Bilirubin, Direct <0.1 0.0 - 0.2 mg/dL   Indirect Bilirubin NOT CALCULATED 0.3 - 0.9 mg/dL  POC SARS Coronavirus 2 Ag-ED - Nasal Swab     Status: None   Collection Time: 01/10/21  9:48 AM  Result Value Ref Range   SARS Coronavirus 2 Ag NEGATIVE NEGATIVE   ____________________________________________  EKG My review and personal interpretation at Time: 21:33   Indication: chest pain  Rate: 110  Rhythm: sinus Axis: normal Other: normal intervals, no stemi ____________________________________________  RADIOLOGY  I personally reviewed all radiographic images ordered to evaluate for the above acute complaints and reviewed radiology reports and findings.  These findings were personally discussed with the patient.  Please see medical record for radiology report.  ____________________________________________   PROCEDURES  Procedure(s) performed:  Procedures    Critical Care performed: no ____________________________________________   INITIAL IMPRESSION / ASSESSMENT AND PLAN / ED COURSE  Pertinent labs & imaging results that were available during my care of the  patient were reviewed by me and considered in my medical decision making (see chart for details).   DDX: Enteritis, gastritis, diverticulitis, PE, pneumonia, COVID-19, ACS,  bronchitis  Ethan Wells is a 50 y.o. who presents to the ED with multiple complaints and symptoms as described above.  He is afebrile mildly tachycardic.  EKG is nonischemic and consistent with previous.  Troponins are stable.  Does not appear consistent with ACS but is describing some pleuritic pain also having abdominal pain.  Cannot PERC and we do not have D-dimer available at this time therefore will order CTA as well as CT abdomen for the above complaints and differential.  Clinical Course as of 01/10/21 1057  Wed Jan 10, 2021  1054 Patient reassessed.  Nontoxic-appearing.  His work-up is grossly reassuring and at baseline.  States that he has been noncompliant with his home medications because he thinks that his blood pressure medication gives him headaches.  Encourage the patient to take his medications as prescribed and follow-up with PCP and cardiology.  Nausea indication for hospitalization at this time. [PR]    Clinical Course User Index [PR] Merlyn Lot, MD    The patient was evaluated in Emergency Department today for the symptoms described in the history of present illness. He/she was evaluated in the context of the global COVID-19 pandemic, which necessitated consideration that the patient might be at risk for infection with the SARS-CoV-2 virus that causes COVID-19. Institutional protocols and algorithms that pertain to the evaluation of patients at risk for COVID-19 are in a state of rapid change based on information released by regulatory bodies including the CDC and federal and state organizations. These policies and algorithms were followed during the patient's care in the ED.  As part of my medical decision making, I reviewed the following data within the [place] notes reviewed and incorporated, Labs reviewed, notes from prior ED visits and [place] Controlled Substance Database   ____________________________________________   FINAL  CLINICAL IMPRESSION(S) / ED DIAGNOSES  Final diagnoses:  Influenza-like illness  Hypertension, unspecified type  Atypical chest pain      NEW MEDICATIONS STARTED DURING THIS VISIT:  New Prescriptions   OXYCODONE-ACETAMINOPHEN (PERCOCET) 5-325 MG TABLET    Take 1 tablet by mouth every 4 (four) hours as needed for severe pain.     Note:  This document was prepared using Dragon voice recognition software and may include unintentional dictation errors.    Merlyn Lot, MD 01/10/21 1058

## 2021-01-10 NOTE — ED Notes (Signed)
ED Provider at bedside. 

## 2021-01-24 ENCOUNTER — Other Ambulatory Visit: Payer: Self-pay

## 2021-01-28 NOTE — Progress Notes (Deleted)
Chief Complaint:  No chief complaint on file.   History of Present Illness: 50 yo male with history of tobacco abuse, cocaine abuse, CAD, ischemic cardiomyopathy here today for follow up and to re-establish cardiac care. He has not been seen in our office since 2014. He was most recently admitted to [facility] in January 2022 with back pain and chest pain. Mild elevation of troponin. UDS positive for cocaine. He was seen by cardiology and ischemic workup was not planned. Chest CTA without aortic dissection or PE. Echo was normal. Imdur was restarted. His cardiac history dates back to 2012 when he was admitted to [facility] with an acute anterior wall STEMI. HIs LAD was occluded. A bare metal stent was placed. EF of 25-30% by echo with akinesis of the anteroseptal wall and apex with f/u echo 7/12 with LVEF=45-50%.  He was non-compliant with medications. He was admitted to [facility] 11/16/12 with chest pain. EKG was unchanged. Cardiac markers negative. Stress myoview in 2013 was low risk with possible anteroseptal ischemia. He was discharged home without invasive workup. He was seen as an inpatient by Psychiatry and Depakote and Buspar were started but he did not start the Depakote due to cost. Echo January 2022 with LVEF=55-60%, no valve disease.   He tells me today that he ***. The patient denies any chest pain, dyspnea, palpitations, lower extremity edema, orthopnea, PND, dizziness, near syncope or syncope.   Primary Care Physician:  [facility]   Past Medical History:  Diagnosis Date  . CAD (coronary artery disease)    a. 01/2011 Anterior STEMI/Cath/PCI: LM nl, LAD 100d (3.5x29mm Vision BMS placed), LCX 79m, RI 50, RCA min irregs, EF 40% w/ apical, inferoapical HK.  . Cardiac arrest - ventricular fibrillation    a. In setting of STEMI 01/2011  . Cocaine abuse ([place])   . ETOH abuse    a. 6-12 beers/day  . Hemorrhoids   . Hypertension   . Ischemic cardiomyopathy     a. 06/2011 Echo: EF 45-50%, No rwma  . Marijuana abuse   . Tobacco abuse    a. 1/2 ppd x 26 yrs    Past Surgical History:  Procedure Laterality Date  . CORONARY ANGIOPLASTY WITH STENT PLACEMENT      Current Outpatient Medications  Medication Sig Dispense Refill  . albuterol (VENTOLIN HFA) 108 (90 Base) MCG/ACT inhaler Inhale 2 puffs into the lungs every 4 (four) hours as needed for wheezing or shortness of breath. 18 g 0  . aspirin 81 MG chewable tablet Chew 1 tablet (81 mg total) by mouth daily. 30 tablet 0  . carvedilol (COREG) 6.25 MG tablet Take 6.25 mg by mouth 2 (two) times daily.    . folic acid (FOLVITE) 1 MG tablet Take 1 tablet (1 mg total) by mouth daily. 30 tablet 0  . gabapentin (NEURONTIN) 300 MG capsule Take 600 mg by mouth at bedtime.    . isosorbide mononitrate (IMDUR) 30 MG 24 hr tablet Take 30 mg by mouth daily.    Marland Kitchen lisinopril (ZESTRIL) 2.5 MG tablet Take 1 tablet (2.5 mg total) by mouth daily. 30 tablet 0  . Multiple Vitamin (MULTIVITAMIN WITH MINERALS) TABS tablet Take 1 tablet by mouth daily. 30 tablet 0  . nitroGLYCERIN (NITROSTAT) 0.4 MG SL tablet Place 1 tablet (0.4 mg total) under the tongue every 5 (five) minutes as needed for chest pain. 20 tablet 1  . Omega-3 Fatty Acids (FISH OIL) 1000 MG CAPS Take 2 capsules  by mouth daily.    . orphenadrine (NORFLEX) 100 MG tablet Take 1 tablet (100 mg total) by mouth 2 (two) times daily. 10 tablet 0  . oxyCODONE-acetaminophen (PERCOCET) 5-325 MG tablet Take 1 tablet by mouth every 4 (four) hours as needed for severe pain. 8 tablet 0  . pantoprazole (PROTONIX) 40 MG tablet Take 1 tablet (40 mg total) by mouth daily at 12 noon. 30 tablet 0  . rosuvastatin (CRESTOR) 40 MG tablet Take 40 mg by mouth daily.    Marland Kitchen thiamine 100 MG tablet Take 1 tablet (100 mg total) by mouth daily. 30 tablet 0  . traMADol (ULTRAM) 50 MG tablet Take 1 tablet (50 mg total) by mouth every 6 (six) hours as needed for severe pain. 10 tablet 0  .  vitamin E 45 MG (100 UNITS) capsule Take 200 Units by mouth daily.     No current facility-administered medications for this visit.    Allergies  Allergen Reactions  . Penicillins Hives    Social History   Socioeconomic History  . Marital status: Single    Spouse name: Not on file  . Number of children: Not on file  . Years of education: Not on file  . Highest education level: Not on file  Occupational History  . Not on file  Tobacco Use  . Smoking status: Current Every Day Smoker    Packs/day: 0.50    Years: 26.00    Pack years: 13.00    Types: Cigarettes  . Smokeless tobacco: Never Used  Vaping Use  . Vaping Use: Never used  Substance and Sexual Activity  . Alcohol use: Yes    Comment: at least a 6 pack of beer daily and often a 12 pack  . Drug use: Not Currently    Types: Cocaine, Marijuana    Comment:  cocaine used several weeks ago; marijuana today  . Sexual activity: Not on file  Other Topics Concern  . Not on file  Social History Narrative   Single, lives in [place] with his mother.  He does not routinely exercise.  He sometimes works, helping to deliver appliances to homes.   Social Determinants of Health   Financial Resource Strain: Not on file  Food Insecurity: Not on file  Transportation Needs: Not on file  Physical Activity: Not on file  Stress: Not on file  Social Connections: Not on file  Intimate Partner Violence: Not on file    Family History  Problem Relation Age of Onset  . Coronary artery disease Mother        alive  . Other Other        2 sisters alive and well  . Alcohol abuse Father        died @ 89    Review of Systems:  As stated in the HPI and otherwise negative.   There were no vitals taken for this visit.  Physical Examination:  General: Well developed, well nourished, NAD  HEENT: OP clear, mucus membranes moist  SKIN: warm, dry. No rashes. Neuro: No focal deficits  Musculoskeletal: Muscle strength 5/5 all ext   Psychiatric: Mood and affect normal  Neck: No JVD, no carotid bruits, no thyromegaly, no lymphadenopathy.  Lungs:Clear bilaterally, no wheezes, rhonci, crackles Cardiovascular: Regular rate and rhythm. No murmurs, gallops or rubs. Abdomen:Soft. Bowel sounds present. Non-tender.  Extremities: No lower extremity edema. Pulses are 2 + in the bilateral DP/PT.  EKG is ordered today and shows ***  Echo January 2022:  1. Left ventricular ejection fraction, by estimation, is 55 to 60%. The  left ventricle has normal function. The left ventricle has no regional  wall motion abnormalities. Left ventricular diastolic parameters were  normal.  2. Right ventricular systolic function is normal. The right ventricular  size is normal.  3. The mitral valve is normal in structure. Trivial mitral valve  regurgitation. No evidence of mitral stenosis.  4. The aortic valve is normal in structure. Aortic valve regurgitation is  not visualized. No aortic stenosis is present.  5. The inferior vena cava is normal in size with greater than 50%  respiratory variability, suggesting right atrial pressure of 3 mmHg.   Assessment and Plan:   1. CAD without angina: No recent chest pains. Continue ASA, beta blocker, Imdur and statin.   Lauree Chandler 01/28/2021 2:07 PM

## 2021-01-29 ENCOUNTER — Ambulatory Visit: Payer: Medicare HMO | Admitting: Cardiovascular Disease

## 2021-02-15 ENCOUNTER — Telehealth: Payer: Self-pay

## 2021-02-15 ENCOUNTER — Other Ambulatory Visit: Payer: Self-pay

## 2021-02-15 NOTE — Telephone Encounter (Signed)
NOTES ON [place] [phone], SENT REFERRAL TO SCHEDULING

## 2021-02-16 ENCOUNTER — Other Ambulatory Visit: Payer: Self-pay

## 2021-02-16 ENCOUNTER — Telehealth: Payer: Self-pay

## 2021-02-16 ENCOUNTER — Ambulatory Visit (INDEPENDENT_AMBULATORY_CARE_PROVIDER_SITE_OTHER): Payer: Medicare HMO | Admitting: Gastroenterology

## 2021-02-16 ENCOUNTER — Encounter: Payer: Self-pay | Admitting: Gastroenterology

## 2021-02-16 VITALS — BP 148/94 | HR 98 | Temp 97.6°F | Ht 65.0 in | Wt 207.5 lb

## 2021-02-16 DIAGNOSIS — F1010 Alcohol abuse, uncomplicated: Secondary | ICD-10-CM

## 2021-02-16 DIAGNOSIS — Z1211 Encounter for screening for malignant neoplasm of colon: Secondary | ICD-10-CM

## 2021-02-16 DIAGNOSIS — R7989 Other specified abnormal findings of blood chemistry: Secondary | ICD-10-CM | POA: Diagnosis not present

## 2021-02-16 DIAGNOSIS — R1013 Epigastric pain: Secondary | ICD-10-CM

## 2021-02-16 DIAGNOSIS — F1910 Other psychoactive substance abuse, uncomplicated: Secondary | ICD-10-CM

## 2021-02-16 MED ORDER — NA SULFATE-K SULFATE-MG SULF 17.5-3.13-1.6 GM/177ML PO SOLN: 354.0000 mL | Freq: Once | ORAL | 0 refills | Status: AC

## 2021-02-16 MED ORDER — OMEPRAZOLE 40 MG PO CPDR
40.0000 mg | DELAYED_RELEASE_CAPSULE | Freq: Two times a day (BID) | ORAL | 2 refills | Status: DC
Start: 2021-02-16 — End: 2021-02-20

## 2021-02-16 NOTE — Telephone Encounter (Signed)
   [facility] Pre-operative Risk Assessment    Request for surgical clearance:  1. What type of surgery is being performed? Colonoscopy and EGD    2. When is this surgery scheduled? 03/01/2021  3. Are there any medications that need to be held prior to surgery and how long?none    4. Practice name and name of physician performing surgery? [facility] Dr. Marius Ditch   5. What is your office phone and fax number? [phone]   6. Anesthesia type (None, local, MAC, general) ? General    Ethan Wells 02/16/2021, 11:38 AM  _________________________________________________________________   (provider comments below)

## 2021-02-16 NOTE — Progress Notes (Signed)
Cephas Darby, MD [address]  [place]  [place], [place] [address]  Main: [phone]  Fax: [phone]    Gastroenterology Consultation  Referring Provider:     [facility] Primary Care Physician:  [place] Primary Gastroenterologist:  Dr. Cephas Darby Reason for Consultation:     Epigastric pain        HPI:   Ethan Wells is a 50 y.o. male referred by Dr. Rolley Sims, [facility]  for consultation & management of epigastric pain.  Patient has been experiencing upper abdominal pain for about a year associated with intermittent nausea and vomiting.  He has history of heavy alcohol abuse, polysubstance abuse including cocaine, history of cardiac arrest in 2012, followed by cardiologist in the past.  History of coronary disease, STEMI s/p PCI in 2012.  Patient went to ER end of January secondary to flareup of epigastric pain, underwent CT angio chest PE protocol, found to have pulmonary hypertension, CT abdomen pelvis with no acute intra-abdominal pathology.  No evidence of acute pancreatitis, serum lipase was normal.  He did have mildly elevated LFTs.  Patient was discharged on Protonix 40 mg daily.  He denies any heartburn but continues to have epigastric pain, worse postprandial.  He drinks 6 packs of beer daily since age of 46.  He also reports intermittent rectal bleeding.  No evidence of anemia Positive tobacco use NSAIDs: None  Antiplts/Anticoagulants/Anti thrombotics: None  GI Procedures: None  Past Medical History:  Diagnosis Date  . CAD (coronary artery disease)    a. 01/2011 Anterior STEMI/Cath/PCI: LM nl, LAD 100d (3.5x37mm Vision BMS placed), LCX 30m, RI 50, RCA min irregs, EF 40% w/ apical, inferoapical HK.  . Cardiac arrest - ventricular fibrillation    a. In setting of STEMI 01/2011  . Cocaine abuse ([place])   . ETOH abuse    a. 6-12 beers/day  . Hemorrhoids   . Hypertension   . Ischemic cardiomyopathy    a.  06/2011 Echo: EF 45-50%, No rwma  . Marijuana abuse   . Tobacco abuse    a. 1/2 ppd x 26 yrs    Past Surgical History:  Procedure Laterality Date  . CORONARY ANGIOPLASTY WITH STENT PLACEMENT      Current Outpatient Medications:  .  albuterol (VENTOLIN HFA) 108 (90 Base) MCG/ACT inhaler, Inhale 2 puffs into the lungs every 4 (four) hours as needed for wheezing or shortness of breath., Disp: 18 g, Rfl: 0 .  aspirin 81 MG chewable tablet, Chew 1 tablet (81 mg total) by mouth daily., Disp: 30 tablet, Rfl: 0 .  gabapentin (NEURONTIN) 300 MG capsule, Take 600 mg by mouth at bedtime., Disp: , Rfl:  .  isosorbide mononitrate (IMDUR) 30 MG 24 hr tablet, Take 30 mg by mouth daily., Disp: , Rfl:  .  lisinopril (ZESTRIL) 5 MG tablet, , Disp: , Rfl:  .  Multiple Vitamin (MULTIVITAMIN WITH MINERALS) TABS tablet, Take 1 tablet by mouth daily., Disp: 30 tablet, Rfl: 0 .  nitroGLYCERIN (NITROSTAT) 0.4 MG SL tablet, Place 1 tablet (0.4 mg total) under the tongue every 5 (five) minutes as needed for chest pain., Disp: 20 tablet, Rfl: 1 .  Omega-3 Fatty Acids (FISH OIL) 1000 MG CAPS, Take 2 capsules by mouth daily., Disp: , Rfl:  .  omeprazole (PRILOSEC) 40 MG capsule, Take 1 capsule (40 mg total) by mouth 2 (two) times daily before a meal., Disp: 60 capsule, Rfl: 2 .  Na Sulfate-K Sulfate-Mg  Sulf 17.5-3.13-1.6 GM/177ML SOLN, Take 354 mLs by mouth once for 1 dose., Disp: 354 mL, Rfl: 0   Family History  Problem Relation Age of Onset  . Coronary artery disease Mother        alive  . Other Other        2 sisters alive and well  . Alcohol abuse Father        died @ 57     Social History   Tobacco Use  . Smoking status: Current Every Day Smoker    Packs/day: 0.50    Years: 26.00    Pack years: 13.00    Types: Cigarettes  . Smokeless tobacco: Never Used  Vaping Use  . Vaping Use: Never used  Substance Use Topics  . Alcohol use: Yes    Comment: at least a 6 pack of beer daily and often a 12 pack   . Drug use: Not Currently    Types: Cocaine, Marijuana    Comment:  cocaine used several weeks ago; marijuana today    Allergies as of 02/16/2021 - Review Complete 02/16/2021  Allergen Reaction Noted  . Penicillins Hives 02/26/2011    Review of Systems:    All systems reviewed and negative except where noted in HPI.   Physical Exam:  BP (!) 148/94 (BP Location: Left Arm, Patient Position: Sitting, Cuff Size: Normal)   Pulse 98   Temp 97.6 F (36.4 C) (Oral)   Ht 5\' 5"  (1.651 m)   Wt 207 lb 8 oz (94.1 kg)   BMI 34.53 kg/m  No LMP for male patient.  General:   Alert,  Well-developed, well-nourished, pleasant and cooperative in NAD Head:  Normocephalic and atraumatic. Eyes:  Sclera clear, no icterus.   Conjunctiva pink. Ears:  Normal auditory acuity. Nose:  No deformity, discharge, or lesions. Mouth:  No deformity or lesions,oropharynx pink & moist. Neck:  Supple; no masses or thyromegaly. Lungs:  Respirations even and unlabored.  Clear throughout to auscultation.   No wheezes, crackles, or rhonchi. No acute distress. Heart:  Regular rate and rhythm; no murmurs, clicks, rubs, or gallops. Abdomen:  Normal bowel sounds. Soft, epigastric tenderness and non-distended without masses, hepatosplenomegaly or hernias noted.  No guarding or rebound tenderness.   Rectal: Not performed Msk:  Symmetrical without gross deformities. Good, equal movement & strength bilaterally. Pulses:  Normal pulses noted. Extremities:  No clubbing or edema.  No cyanosis. Neurologic:  Alert and oriented x3;  grossly normal neurologically. Skin:  Intact without significant lesions or rashes. No jaundice. Psych:  Alert and cooperative. Normal mood and affect.  Imaging Studies: Reviewed  Assessment and Plan:   Ethan Wells is a 50 y.o. male with obesity, polysubstance abuse, alcohol abuse, history of coronary disease s/p PCI, STEMI, cardiac arrest in 2012, tobacco abuse is seen in consultation for  chronic epigastric pain associated with intermittent nausea and vomiting.  No evidence of anemia.  No evidence of acute pancreatitis.  Patient may have chronic pancreatitis or alcoholic gastritis or peptic ulcer disease or erosive esophagitis given history of substance abuse  Switch to Prilosec 40 mg twice daily before meals Recommend upper endoscopy for further evaluation Counseled him to cut back on alcohol use, discussed about long-term adverse effects Also recommend colonoscopy for colon cancer screening with 2-day prep  Elevated LFTs Imaging evidence of fatty liver, likely alcoholic steatohepatosis Complete abstinence from alcohol use   Follow up in 3 months   Cephas Darby, MD

## 2021-02-16 NOTE — Telephone Encounter (Signed)
   Primary Cardiologist: Lauree Chandler, MD  Chart reviewed as part of pre-operative protocol coverage. Because of Ethan Wells's past medical history and time since last visit, he will require a follow-up visit in order to better assess preoperative cardiovascular risk.  He was previously established with Dr. Angelena Form though most recently seen while hospitalized by Dr. Garen Lah. As he lives in [place], [place] office may be easier for follow up.   Pre-op covering staff: - Please schedule appointment and call patient to inform them. If patient already had an upcoming appointment within acceptable timeframe, please add "pre-op clearance" to the appointment notes so provider is aware. - Please contact requesting surgeon's office via preferred method (i.e, phone, fax) to inform them of need for appointment prior to surgery.  Loel Dubonnet, NP  02/16/2021, 12:33 PM

## 2021-02-16 NOTE — Telephone Encounter (Signed)
   Primary Cardiologist: Lauree Chandler, MD  Chart reviewed as part of pre-operative protocol coverage. Because of [name]'s past medical history and time since last visit, he will require a follow-up visit in order to better assess preoperative cardiovascular risk. Scheduled for 02/20/21. Requesting party aware. Will remove from preop pool.    Loel Dubonnet, NP  02/16/2021, 2:05 PM

## 2021-02-16 NOTE — Telephone Encounter (Addendum)
Patient call our office back and I was able to schedule pt with Dr. Angelena Form on 3/8 at 3:20 pm. Pt verbalized understanding and thanked me for the call. I will fax over recommendations to requesting provider's office.

## 2021-02-16 NOTE — Telephone Encounter (Signed)
Spoke with pt who states that he only wants to be seen by Dr. Angelena Form. I made pt aware that Dr. Angelena Form doesn't have any availability before his procedure to be cleared. Pt states that he is willing to reschedule his procedure so that he can be able to be cleared only by Dr. Angelena Form. I told pt that I will speak with Dr. Camillia Herter nurse and call him back to see if we can get him in sooner. Pt thanked me for the call.

## 2021-02-16 NOTE — Telephone Encounter (Signed)
I left a message for the patient to return my call.

## 2021-02-20 ENCOUNTER — Encounter: Payer: Self-pay | Admitting: *Deleted

## 2021-02-20 ENCOUNTER — Ambulatory Visit (INDEPENDENT_AMBULATORY_CARE_PROVIDER_SITE_OTHER): Payer: Medicare HMO | Admitting: Cardiovascular Disease

## 2021-02-20 ENCOUNTER — Encounter: Payer: Self-pay | Admitting: Cardiovascular Disease

## 2021-02-20 ENCOUNTER — Other Ambulatory Visit: Payer: Self-pay

## 2021-02-20 VITALS — BP 122/76 | HR 83 | Ht 65.0 in | Wt 209.0 lb

## 2021-02-20 DIAGNOSIS — I2510 Atherosclerotic heart disease of native coronary artery without angina pectoris: Secondary | ICD-10-CM

## 2021-02-20 NOTE — Patient Instructions (Signed)
Medication Instructions:  No changes made today. *If you need a refill on your cardiac medications before your next appointment, please call your pharmacy*   Lab Work: none If you have labs (blood work) drawn today and your tests are completely normal, you will receive your results only by: Marland Kitchen MyChart Message (if you have MyChart) OR . A paper copy in the mail If you have any lab test that is abnormal or we need to change your treatment, we will call you to review the results.   Testing/Procedures: none   Follow-Up: At [facility], you and your health needs are our priority.  As part of our continuing mission to provide you with exceptional heart care, we have created designated Provider Care Teams.  These Care Teams include your primary Cardiologist (physician) and Advanced Practice Providers (APPs -  Physician Assistants and Nurse Practitioners) who all work together to provide you with the care you need, when you need it.  We recommend signing up for the patient portal called "MyChart".  Sign up information is provided on this After Visit Summary.  MyChart is used to connect with patients for Virtual Visits (Telemedicine).  Patients are able to view lab/test results, encounter notes, upcoming appointments, etc.  Non-urgent messages can be sent to your provider as well.   To learn more about what you can do with MyChart, go to [email or web address].    Your next appointment:   12 month(s)  The format for your next appointment:   In Person  Provider:   You may see Lauree Chandler, MD or one of the following Advanced Practice Providers on your designated Care Team:    Melina Copa, PA-C  Ermalinda Barrios, PA-C   Other Instructions
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
## 2021-02-22 ENCOUNTER — Other Ambulatory Visit: Payer: Self-pay

## 2021-02-22 ENCOUNTER — Encounter: Payer: Self-pay | Admitting: Gastroenterology

## 2021-02-27 ENCOUNTER — Other Ambulatory Visit
Admission: RE | Admit: 2021-02-27 | Discharge: 2021-02-27 | Disposition: A | Discharge status: Home / Self Care | Payer: Medicare HMO | Source: Ambulatory Visit | Attending: Gastroenterology | Admitting: Gastroenterology

## 2021-02-27 ENCOUNTER — Other Ambulatory Visit: Payer: Self-pay

## 2021-02-27 DIAGNOSIS — Z20822 Contact with and (suspected) exposure to covid-19: Secondary | ICD-10-CM | POA: Diagnosis not present

## 2021-02-27 DIAGNOSIS — Z01812 Encounter for preprocedural laboratory examination: Secondary | ICD-10-CM | POA: Diagnosis present

## 2021-02-27 LAB — SARS CORONAVIRUS 2 (TAT 6-24 HRS): SARS Coronavirus 2: NEGATIVE

## 2021-02-28 NOTE — Discharge Instructions (Signed)

## 2021-03-01 ENCOUNTER — Encounter: Payer: Self-pay | Admitting: Gastroenterology

## 2021-03-01 ENCOUNTER — Encounter
Admission: RE | Disposition: A | Discharge status: Home / Self Care | Payer: Self-pay | Source: Home / Self Care | Attending: Gastroenterology

## 2021-03-01 ENCOUNTER — Other Ambulatory Visit: Payer: Self-pay

## 2021-03-01 ENCOUNTER — Ambulatory Visit
Admission: RE | Admit: 2021-03-01 | Discharge: 2021-03-01 | Disposition: A | Discharge status: Home / Self Care | Payer: Medicare HMO | Attending: Gastroenterology | Admitting: Gastroenterology

## 2021-03-01 ENCOUNTER — Ambulatory Visit: Payer: Medicare HMO | Admitting: Anesthesiology

## 2021-03-01 DIAGNOSIS — K2920 Alcoholic gastritis without bleeding: Secondary | ICD-10-CM | POA: Diagnosis not present

## 2021-03-01 DIAGNOSIS — Z7982 Long term (current) use of aspirin: Secondary | ICD-10-CM | POA: Diagnosis not present

## 2021-03-01 DIAGNOSIS — K2289 Other specified disease of esophagus: Secondary | ICD-10-CM | POA: Insufficient documentation

## 2021-03-01 DIAGNOSIS — I252 Old myocardial infarction: Secondary | ICD-10-CM | POA: Diagnosis not present

## 2021-03-01 DIAGNOSIS — I255 Ischemic cardiomyopathy: Secondary | ICD-10-CM | POA: Insufficient documentation

## 2021-03-01 DIAGNOSIS — I10 Essential (primary) hypertension: Secondary | ICD-10-CM | POA: Diagnosis not present

## 2021-03-01 DIAGNOSIS — I2510 Atherosclerotic heart disease of native coronary artery without angina pectoris: Secondary | ICD-10-CM | POA: Diagnosis not present

## 2021-03-01 DIAGNOSIS — K449 Diaphragmatic hernia without obstruction or gangrene: Secondary | ICD-10-CM | POA: Insufficient documentation

## 2021-03-01 DIAGNOSIS — Z8674 Personal history of sudden cardiac arrest: Secondary | ICD-10-CM | POA: Diagnosis not present

## 2021-03-01 DIAGNOSIS — Z955 Presence of coronary angioplasty implant and graft: Secondary | ICD-10-CM | POA: Insufficient documentation

## 2021-03-01 DIAGNOSIS — Z880 Allergy status to penicillin: Secondary | ICD-10-CM | POA: Insufficient documentation

## 2021-03-01 DIAGNOSIS — E785 Hyperlipidemia, unspecified: Secondary | ICD-10-CM | POA: Insufficient documentation

## 2021-03-01 DIAGNOSIS — K219 Gastro-esophageal reflux disease without esophagitis: Secondary | ICD-10-CM | POA: Insufficient documentation

## 2021-03-01 DIAGNOSIS — Z8249 Family history of ischemic heart disease and other diseases of the circulatory system: Secondary | ICD-10-CM | POA: Insufficient documentation

## 2021-03-01 DIAGNOSIS — Z8719 Personal history of other diseases of the digestive system: Secondary | ICD-10-CM | POA: Insufficient documentation

## 2021-03-01 DIAGNOSIS — K319 Disease of stomach and duodenum, unspecified: Secondary | ICD-10-CM | POA: Diagnosis not present

## 2021-03-01 DIAGNOSIS — F17210 Nicotine dependence, cigarettes, uncomplicated: Secondary | ICD-10-CM | POA: Diagnosis not present

## 2021-03-01 DIAGNOSIS — Z79899 Other long term (current) drug therapy: Secondary | ICD-10-CM | POA: Diagnosis not present

## 2021-03-01 DIAGNOSIS — R1013 Epigastric pain: Secondary | ICD-10-CM

## 2021-03-01 DIAGNOSIS — Z1211 Encounter for screening for malignant neoplasm of colon: Secondary | ICD-10-CM

## 2021-03-01 HISTORY — PX: ESOPHAGOGASTRODUODENOSCOPY (EGD) WITH PROPOFOL: SHX5813

## 2021-03-01 SURGERY — ESOPHAGOGASTRODUODENOSCOPY (EGD) WITH PROPOFOL
Anesthesia: General

## 2021-03-01 MED ORDER — SODIUM CHLORIDE 0.9 % IV SOLN: INTRAVENOUS | Status: DC

## 2021-03-01 MED ORDER — LIDOCAINE HCL (CARDIAC) PF 100 MG/5ML IV SOSY
PREFILLED_SYRINGE | INTRAVENOUS | Status: DC | PRN
  Administered 2021-03-01: 50 mg via INTRAVENOUS

## 2021-03-01 MED ORDER — LACTATED RINGERS IV SOLN: INTRAVENOUS | Status: DC

## 2021-03-01 MED ORDER — METOPROLOL TARTRATE 5 MG/5ML IV SOLN
INTRAVENOUS | Status: DC | PRN
  Administered 2021-03-01: 1 mg via INTRAVENOUS
  Administered 2021-03-01: 2 mg via INTRAVENOUS
  Administered 2021-03-01 (×2): 1 mg via INTRAVENOUS

## 2021-03-01 MED ORDER — GLYCOPYRROLATE 0.2 MG/ML IJ SOLN
INTRAMUSCULAR | Status: DC | PRN
  Administered 2021-03-01: .1 mg via INTRAVENOUS

## 2021-03-01 MED ORDER — PROPOFOL 10 MG/ML IV BOLUS
INTRAVENOUS | Status: DC | PRN
  Administered 2021-03-01 (×3): 40 mg via INTRAVENOUS
  Administered 2021-03-01: 50 mg via INTRAVENOUS
  Administered 2021-03-01: 150 mg via INTRAVENOUS
  Administered 2021-03-01: 40 mg via INTRAVENOUS
  Administered 2021-03-01: 20 mg via INTRAVENOUS

## 2021-03-01 SURGICAL SUPPLY — 8 items
BLOCK BITE 60FR ADLT L/F GRN (MISCELLANEOUS) ×2 IMPLANT
FORCEPS BIOP RAD 4 LRG CAP 4 (CUTTING FORCEPS) ×2 IMPLANT
GOWN CVR UNV OPN BCK APRN NK (MISCELLANEOUS) ×2 IMPLANT
GOWN ISOL THUMB LOOP REG UNIV (MISCELLANEOUS) ×4
KIT PRC NS LF DISP ENDO (KITS) ×1 IMPLANT
KIT PROCEDURE OLYMPUS (KITS) ×2
MANIFOLD NEPTUNE II (INSTRUMENTS) ×2 IMPLANT
WATER STERILE IRR 250ML POUR (IV SOLUTION) ×2 IMPLANT

## 2021-03-01 NOTE — Op Note (Signed)
[facility] Patient Name: Ethan Wells Procedure Date: 03/01/2021 10:33 AM MRN: [id number] Account #: [id number] Date of Birth: Dec 27, 1970 Admit Type: Outpatient Age: 50 Room: [facility] OR ROOM 01 Gender: Male Note Status: Finalized Procedure:             Upper GI endoscopy Indications:           Epigastric abdominal pain, Esophageal reflux symptoms                         that persist despite appropriate therapy Providers:             Lin Landsman MD, MD Referring MD:          Burnell Blanks, MD (Referring MD) Medicines:             General Anesthesia Complications:         No immediate complications. Estimated blood loss:                         Minimal. Procedure:             Pre-Anesthesia Assessment:                        - Prior to the procedure, a History and Physical was                         performed, and patient medications and allergies were                         reviewed. The patient is competent. The risks and                         benefits of the procedure and the sedation options and                         risks were discussed with the patient. All questions                         were answered and informed consent was obtained.                         Patient identification and proposed procedure were                         verified by the physician, the nurse, the                         anesthesiologist, the anesthetist and the technician                         in the pre-procedure area in the procedure room in the                         endoscopy suite. Mental Status Examination: alert and                         oriented. Airway Examination: normal oropharyngeal  airway and neck mobility. Respiratory Examination:                         clear to auscultation. CV Examination: normal.                         Prophylactic Antibiotics: The patient does not require                          prophylactic antibiotics. Prior Anticoagulants: The                         patient has taken no previous anticoagulant or                         antiplatelet agents. ASA Grade Assessment: III - A                         patient with severe systemic disease. After reviewing                         the risks and benefits, the patient was deemed in                         satisfactory condition to undergo the procedure. The                         anesthesia plan was to use general anesthesia.                         Immediately prior to administration of medications,                         the patient was re-assessed for adequacy to receive                         sedatives. The heart rate, respiratory rate, oxygen                         saturations, blood pressure, adequacy of pulmonary                         ventilation, and response to care were monitored                         throughout the procedure. The physical status of the                         patient was re-assessed after the procedure.                        After obtaining informed consent, the endoscope was                         passed under direct vision. Throughout the procedure,                         the patient's blood pressure, pulse, and oxygen  saturations were monitored continuously. The was                         introduced through the mouth, and advanced to the                         second part of duodenum. The upper GI endoscopy was                         accomplished without difficulty. The patient tolerated                         the procedure poorly due to the patient's respiratory                         instability (hypoxia). Findings:      The duodenal bulb and second portion of the duodenum were normal.      Diffuse moderate inflammation characterized by congestion (edema),       erosions and friability was found in the entire examined stomach.       Biopsies were  taken with a cold forceps for histology.      A medium-sized hiatal hernia was present.      Circumferential salmon-colored mucosa was present from 35 to 38 cm. No       other visible abnormalities were present. The maximum longitudinal       extent of these esophageal mucosal changes was 3 cm in length. Biopsies       were taken with a cold forceps for histology. Impression:            - Normal duodenal bulb and second portion of the                         duodenum.                        - Alcoholic gastritis. Biopsied.                        - Medium-sized hiatal hernia.                        - Salmon-colored mucosa suspicious for long-segment                         Barrett's esophagus. Biopsied. Recommendation:        - Await pathology results.                        - Discharge patient to home (with escort).                        - Resume previous diet today.                        - Continue present medications.                        - Await pathology results.                        - Return to my  office in 3 months. Procedure Code(s):     --- Professional ---                        [phone], Esophagogastroduodenoscopy, flexible,                         transoral; with biopsy, single or multiple Diagnosis Code(s):     --- Professional ---                        K22.8, Other specified diseases of esophagus                        L87.56, Alcoholic gastritis without bleeding                        K44.9, Diaphragmatic hernia without obstruction or                         gangrene                        R10.13, Epigastric pain                        K21.9, Gastro-esophageal reflux disease without                         esophagitis CPT copyright 2019 American Medical Association. All rights reserved. The codes documented in this report are preliminary and upon coder review may  be revised to meet current compliance requirements. Dr. Ulyess Mort Lin Landsman MD,  MD 03/01/2021 11:11:03 AM This report has been signed electronically. Number of Addenda: 0 Note Initiated On: 03/01/2021 10:33 AM Total Procedure Duration: 0 hours 14 minutes 42 seconds  Estimated Blood Loss:  Estimated blood loss was minimal.      [facility]

## 2021-03-01 NOTE — Anesthesia Postprocedure Evaluation (Signed)
Anesthesia Post Note  Patient: YITZCHAK KOTHARI  Procedure(s) Performed: ESOPHAGOGASTRODUODENOSCOPY (EGD) WITH PROPOFOL (N/A )     Patient location during evaluation: PACU Anesthesia Type: General Level of consciousness: awake and alert Pain management: pain level controlled Vital Signs Assessment: post-procedure vital signs reviewed and stable Respiratory status: spontaneous breathing and nonlabored ventilation Cardiovascular status: blood pressure returned to baseline (EKG obtained in PACU, revealing NSR. Unchanged from previous EKGs. Patient denies chest pain, SOB.) Postop Assessment: no apparent nausea or vomiting Anesthetic complications: yes (Patient with likely brochospasm during EGD. Thick secretions suctioned from airway, including likely mucous plug. Responed appropriately to positive pressure ventilation and indreasing depth of anesthesia.)   No complications documented.  Tamora Huneke [employer]

## 2021-03-01 NOTE — H&P (Signed)
Cephas Darby, MD [address]  [place]  [place], [place] [address]  Main: [phone]  Fax: [phone] Pager: [phone]  Primary Care Physician:  [place] Primary Gastroenterologist:  Dr. Cephas Darby  Pre-Procedure History & Physical: HPI:  Ethan Wells is a 50 y.o. male is here for an endoscopy and colonoscopy.   Past Medical History:  Diagnosis Date  . CAD (coronary artery disease)    a. 01/2011 Anterior STEMI/Cath/PCI: LM nl, LAD 100d (3.5x89mm Vision BMS placed), LCX 54m, RI 50, RCA min irregs, EF 40% w/ apical, inferoapical HK.  . Cardiac arrest - ventricular fibrillation    a. In setting of STEMI 01/2011  . Chronic pain   . Cocaine abuse ([place])   . Depression with anxiety   . ETOH abuse    a. 6-12 beers/day  . GERD (gastroesophageal reflux disease)   . Hemorrhoids   . Hyperlipidemia   . Hypertension   . Ischemic cardiomyopathy    a. 06/2011 Echo: EF 45-50%, No rwma  . Marijuana abuse   . Migraines   . Tobacco abuse    a. 1/2 ppd x 26 yrs    Past Surgical History:  Procedure Laterality Date  . CORONARY ANGIOPLASTY WITH STENT PLACEMENT      Prior to Admission medications   Medication Sig Start Date End Date Taking? Authorizing Provider  aspirin 81 MG chewable tablet Chew 1 tablet (81 mg total) by mouth daily. 06/07/20  Yes Fritzi Mandes, MD  carvedilol (COREG) 6.25 MG tablet Take 6.25 mg by mouth 2 (two) times daily with a meal.   Yes [provider]  gabapentin (NEURONTIN) 300 MG capsule Take 600 mg by mouth at bedtime. 11/29/20  Yes [provider]  lisinopril (ZESTRIL) 5 MG tablet  12/31/20  Yes [provider]  Multiple Vitamin (MULTIVITAMIN WITH MINERALS) TABS tablet Take 1 tablet by mouth daily. 06/07/20  Yes Fritzi Mandes, MD  nitroGLYCERIN (NITROSTAT) 0.4 MG SL tablet Place 1 tablet (0.4 mg total) under the tongue every 5 (five) minutes as needed for chest pain. 06/06/20  Yes Fritzi Mandes, MD   Omega-3 Fatty Acids (FISH OIL) 1000 MG CAPS Take 2 capsules by mouth daily.   Yes [provider]  omeprazole (PRILOSEC) 40 MG capsule Take 40 mg by mouth 2 (two) times daily.   Yes [provider]  rosuvastatin (CRESTOR) 40 MG tablet Take 40 mg by mouth daily.   Yes [provider]  Sertraline HCl (ZOLOFT PO) Take by mouth daily.   Yes [provider]  albuterol (VENTOLIN HFA) 108 (90 Base) MCG/ACT inhaler Inhale 2 puffs into the lungs every 4 (four) hours as needed for wheezing or shortness of breath. 12/09/20   Paulette Blanch, MD  PANTOPRAZOLE SODIUM PO Take 1 tablet by mouth in the morning and at bedtime. Pt unsure of dosage Patient not taking: Reported on 02/22/2021    [provider]    Allergies as of 02/16/2021 - Review Complete 02/16/2021  Allergen Reaction Noted  . Penicillins Hives 02/26/2011    Family History  Problem Relation Age of Onset  . Coronary artery disease Mother        alive  . Other Other        2 sisters alive and well  . Alcohol abuse Father        died @ 33  . Other Paternal Grandmother        PACER    Social History  Socioeconomic History  . Marital status: Single    Spouse name: Not on file  . Number of children: Not on file  . Years of education: Not on file  . Highest education level: Not on file  Occupational History  . Not on file  Tobacco Use  . Smoking status: Current Every Day Smoker    Packs/day: 0.50    Years: 41.00    Pack years: 20.50    Types: Cigarettes  . Smokeless tobacco: Never Used  . Tobacco comment: started age 34  Vaping Use  . Vaping Use: Never used  Substance and Sexual Activity  . Alcohol use: Yes    Comment: at least a 6 pack of beer daily and often a 12 pack  . Drug use: Not Currently    Types: Cocaine, Marijuana    Comment: (02/22/21 - pt reports none last 6 mos) cocaine used several weeks ago; marijuana today  . Sexual activity: Not on file  Other Topics Concern  .  Not on file  Social History Narrative   Single, lives in [place] with his mother.  He does not routinely exercise.  He sometimes works, helping to deliver appliances to homes.   Social Determinants of Health   Financial Resource Strain: Not on file  Food Insecurity: Not on file  Transportation Needs: Not on file  Physical Activity: Not on file  Stress: Not on file  Social Connections: Not on file  Intimate Partner Violence: Not on file    Review of Systems: See HPI, otherwise negative ROS  Physical Exam: BP 133/78   Pulse 76   Temp (!) 97.5 F (36.4 C) (Temporal)   Wt 92.1 kg   SpO2 96%   BMI 33.78 kg/m  General:   Alert,  pleasant and cooperative in NAD Head:  Normocephalic and atraumatic. Neck:  Supple; no masses or thyromegaly. Lungs:  Clear throughout to auscultation.    Heart:  Regular rate and rhythm. Abdomen:  Soft, nontender and nondistended. Normal bowel sounds, without guarding, and without rebound.   Neurologic:  Alert and  oriented x4;  grossly normal neurologically.  Impression/Plan: Ethan Wells is here for an endoscopy and colonoscopy to be performed for epigastric pain nd colon cancer screening  Risks, benefits, limitations, and alternatives regarding  endoscopy and colonoscopy have been reviewed with the patient.  Questions have been answered.  All parties agreeable.   Sherri Sear, MD  03/01/2021, 9:33 AM

## 2021-03-01 NOTE — Transfer of Care (Signed)
Immediate Anesthesia Transfer of Care Note  [name]  Procedure(s) Performed: ESOPHAGOGASTRODUODENOSCOPY (EGD) WITH PROPOFOL (N/A )  Patient Location: PACU  Anesthesia Type: General  Level of Consciousness: awake, alert  and patient cooperative  Airway and Oxygen Therapy: Patient Spontanous Breathing and Patient connected to supplemental oxygen  Post-op Assessment: Post-op Vital signs reviewed, Patient's Cardiovascular Status Stable, Respiratory Function Stable, Patent Airway and No signs of Nausea or vomiting  Post-op Vital Signs: Reviewed and stable  Complications: No complications documented.

## 2021-03-01 NOTE — Progress Notes (Signed)
Patient developed respiratory distress during EGD, O2 sats dropped to 50s. Procedure was stopped to treat his hypoxia, did not require invasive ventilation. After his hypoxia improved, EGD was completed.  Did not proceed with colonoscopy today due to hypoxia   Patient to follow up with cardiology before scheduling colonoscopy for colon cancer screening in future  Cephas Darby, MD [place]  [place], [place] [address]  Main: [phone]  Fax: [phone] Pager: [phone]

## 2021-03-01 NOTE — Anesthesia Preprocedure Evaluation (Addendum)
Anesthesia Evaluation  Patient identified by MRN, date of birth, ID band Patient awake    Reviewed: Allergy & Precautions, NPO status , Patient's Chart, lab work & pertinent test results  Airway Mallampati: II  TM Distance: >3 FB Neck ROM: Full    Dental no notable dental hx.    Pulmonary Current SmokerPatient did not abstain from smoking.,    Pulmonary exam normal        Cardiovascular hypertension, + CAD, + Past MI, + Cardiac Stents (x1 in 2012) and +CHF  Normal cardiovascular exam  Echo 12/2020: 1. Left ventricular ejection fraction, by estimation, is 55 to 60%. The left ventricle has normal function. The left ventricle has no regional wall motion abnormalities. Left ventricular diastolic parameters were normal.  2. Right ventricular systolic function is normal. The right ventricular size is normal.  3. The mitral valve is normal in structure. Trivial mitral valve regurgitation. No evidence of mitral stenosis.  4. The aortic valve is normal in structure. Aortic valve regurgitation is  not visualized. No aortic stenosis is present.  5. The inferior vena cava is normal in size with greater than 50% respiratory variability, suggesting right atrial pressure of 3 mmHg.   "He can proceed with his planned GI workup with endoscopy and colonoscopy without further cardiac workup." Lauree Chandler, cardiology 02/20/2021   Neuro/Psych  Headaches, PSYCHIATRIC DISORDERS Anxiety Depression    GI/Hepatic GERD  Controlled,(+)     substance abuse  alcohol use and cocaine use, Epigastric pain    Endo/Other  BMI 33  Renal/GU negative Renal ROS     Musculoskeletal negative musculoskeletal ROS (+)   Abdominal (+) + obese,   Peds  Hematology negative hematology ROS (+)   Anesthesia Other Findings   Reproductive/Obstetrics                            Anesthesia Physical Anesthesia Plan  ASA:  III  Anesthesia Plan: General   Post-op Pain Management:    Induction: Intravenous  PONV Risk Score and Plan: 1 and Propofol infusion, TIVA and Treatment may vary due to age or medical condition  Airway Management Planned: Natural Airway  Additional Equipment: None  Intra-op Plan:   Post-operative Plan:   Informed Consent: I have reviewed the patients History and Physical, chart, labs and discussed the procedure including the risks, benefits and alternatives for the proposed anesthesia with the patient or authorized representative who has indicated his/her understanding and acceptance.     Dental advisory given  Plan Discussed with: CRNA  Anesthesia Plan Comments:         Anesthesia Quick Evaluation

## 2021-03-01 NOTE — Anesthesia Procedure Notes (Signed)
Date/Time: 03/01/2021 10:40 AM Performed by: Cameron Ali, CRNA Pre-anesthesia Checklist: Patient identified, Emergency Drugs available, Suction available, Timeout performed and Patient being monitored Patient Re-evaluated:Patient Re-evaluated prior to induction Oxygen Delivery Method: Nasal cannula Placement Confirmation: positive ETCO2

## 2021-03-01 NOTE — OR Nursing (Signed)
Colonoscopy cancelled due to patient condition.

## 2021-03-02 ENCOUNTER — Encounter: Payer: Self-pay | Admitting: Gastroenterology

## 2021-03-02 LAB — SURGICAL PATHOLOGY

## 2021-03-17 ENCOUNTER — Other Ambulatory Visit: Payer: Self-pay | Admitting: Internal Medicine

## 2021-04-25 ENCOUNTER — Other Ambulatory Visit: Payer: Self-pay | Admitting: Internal Medicine

## 2021-06-06 ENCOUNTER — Other Ambulatory Visit: Payer: Self-pay

## 2021-06-07 ENCOUNTER — Ambulatory Visit: Payer: Medicare HMO | Admitting: Gastroenterology

## 2021-07-02 DIAGNOSIS — H40033 Anatomical narrow angle, bilateral: Secondary | ICD-10-CM | POA: Diagnosis not present

## 2021-08-06 ENCOUNTER — Other Ambulatory Visit: Payer: Self-pay

## 2021-08-07 ENCOUNTER — Inpatient Hospital Stay
Admission: EM | Admit: 2021-08-07 | Discharge: 2021-08-11 | DRG: 812 | Disposition: A | Discharge status: Home / Self Care | Payer: Medicare HMO | Attending: Internal Medicine | Admitting: Internal Medicine

## 2021-08-07 ENCOUNTER — Other Ambulatory Visit: Payer: Self-pay

## 2021-08-07 ENCOUNTER — Emergency Department: Payer: Medicare HMO

## 2021-08-07 DIAGNOSIS — K219 Gastro-esophageal reflux disease without esophagitis: Secondary | ICD-10-CM | POA: Diagnosis present

## 2021-08-07 DIAGNOSIS — Z8249 Family history of ischemic heart disease and other diseases of the circulatory system: Secondary | ICD-10-CM

## 2021-08-07 DIAGNOSIS — I959 Hypotension, unspecified: Secondary | ICD-10-CM | POA: Diagnosis present

## 2021-08-07 DIAGNOSIS — I110 Hypertensive heart disease with heart failure: Secondary | ICD-10-CM | POA: Diagnosis present

## 2021-08-07 DIAGNOSIS — R000 Tachycardia, unspecified: Secondary | ICD-10-CM | POA: Diagnosis not present

## 2021-08-07 DIAGNOSIS — R112 Nausea with vomiting, unspecified: Secondary | ICD-10-CM | POA: Diagnosis not present

## 2021-08-07 DIAGNOSIS — E876 Hypokalemia: Secondary | ICD-10-CM | POA: Diagnosis not present

## 2021-08-07 DIAGNOSIS — F17210 Nicotine dependence, cigarettes, uncomplicated: Secondary | ICD-10-CM | POA: Diagnosis present

## 2021-08-07 DIAGNOSIS — R0602 Shortness of breath: Secondary | ICD-10-CM | POA: Diagnosis not present

## 2021-08-07 DIAGNOSIS — Z880 Allergy status to penicillin: Secondary | ICD-10-CM

## 2021-08-07 DIAGNOSIS — R110 Nausea: Secondary | ICD-10-CM | POA: Diagnosis not present

## 2021-08-07 DIAGNOSIS — K2289 Other specified disease of esophagus: Secondary | ICD-10-CM | POA: Diagnosis not present

## 2021-08-07 DIAGNOSIS — K641 Second degree hemorrhoids: Secondary | ICD-10-CM | POA: Diagnosis present

## 2021-08-07 DIAGNOSIS — D62 Acute posthemorrhagic anemia: Principal | ICD-10-CM | POA: Diagnosis present

## 2021-08-07 DIAGNOSIS — D126 Benign neoplasm of colon, unspecified: Secondary | ICD-10-CM | POA: Diagnosis not present

## 2021-08-07 DIAGNOSIS — K635 Polyp of colon: Secondary | ICD-10-CM | POA: Diagnosis present

## 2021-08-07 DIAGNOSIS — D500 Iron deficiency anemia secondary to blood loss (chronic): Secondary | ICD-10-CM

## 2021-08-07 DIAGNOSIS — K2270 Barrett's esophagus without dysplasia: Secondary | ICD-10-CM | POA: Diagnosis present

## 2021-08-07 DIAGNOSIS — I255 Ischemic cardiomyopathy: Secondary | ICD-10-CM | POA: Diagnosis present

## 2021-08-07 DIAGNOSIS — R1013 Epigastric pain: Secondary | ICD-10-CM | POA: Diagnosis not present

## 2021-08-07 DIAGNOSIS — F1020 Alcohol dependence, uncomplicated: Secondary | ICD-10-CM | POA: Diagnosis present

## 2021-08-07 DIAGNOSIS — I5022 Chronic systolic (congestive) heart failure: Secondary | ICD-10-CM | POA: Diagnosis present

## 2021-08-07 DIAGNOSIS — R52 Pain, unspecified: Secondary | ICD-10-CM | POA: Diagnosis not present

## 2021-08-07 DIAGNOSIS — R1110 Vomiting, unspecified: Secondary | ICD-10-CM | POA: Diagnosis not present

## 2021-08-07 DIAGNOSIS — Z7289 Other problems related to lifestyle: Secondary | ICD-10-CM | POA: Diagnosis not present

## 2021-08-07 DIAGNOSIS — Z20822 Contact with and (suspected) exposure to covid-19: Secondary | ICD-10-CM | POA: Diagnosis present

## 2021-08-07 DIAGNOSIS — R109 Unspecified abdominal pain: Secondary | ICD-10-CM | POA: Diagnosis not present

## 2021-08-07 DIAGNOSIS — G8929 Other chronic pain: Secondary | ICD-10-CM | POA: Diagnosis present

## 2021-08-07 DIAGNOSIS — K760 Fatty (change of) liver, not elsewhere classified: Secondary | ICD-10-CM | POA: Diagnosis present

## 2021-08-07 DIAGNOSIS — K2900 Acute gastritis without bleeding: Secondary | ICD-10-CM

## 2021-08-07 DIAGNOSIS — Z79899 Other long term (current) drug therapy: Secondary | ICD-10-CM

## 2021-08-07 DIAGNOSIS — I252 Old myocardial infarction: Secondary | ICD-10-CM | POA: Diagnosis not present

## 2021-08-07 DIAGNOSIS — Z8674 Personal history of sudden cardiac arrest: Secondary | ICD-10-CM

## 2021-08-07 DIAGNOSIS — Z91018 Allergy to other foods: Secondary | ICD-10-CM

## 2021-08-07 DIAGNOSIS — F418 Other specified anxiety disorders: Secondary | ICD-10-CM | POA: Diagnosis present

## 2021-08-07 DIAGNOSIS — I2510 Atherosclerotic heart disease of native coronary artery without angina pectoris: Secondary | ICD-10-CM | POA: Diagnosis present

## 2021-08-07 DIAGNOSIS — E785 Hyperlipidemia, unspecified: Secondary | ICD-10-CM | POA: Diagnosis present

## 2021-08-07 DIAGNOSIS — R1084 Generalized abdominal pain: Secondary | ICD-10-CM | POA: Diagnosis not present

## 2021-08-07 DIAGNOSIS — I25119 Atherosclerotic heart disease of native coronary artery with unspecified angina pectoris: Secondary | ICD-10-CM

## 2021-08-07 DIAGNOSIS — K2901 Acute gastritis with bleeding: Secondary | ICD-10-CM | POA: Diagnosis not present

## 2021-08-07 DIAGNOSIS — Z955 Presence of coronary angioplasty implant and graft: Secondary | ICD-10-CM

## 2021-08-07 DIAGNOSIS — F1290 Cannabis use, unspecified, uncomplicated: Secondary | ICD-10-CM | POA: Diagnosis present

## 2021-08-07 DIAGNOSIS — E871 Hypo-osmolality and hyponatremia: Secondary | ICD-10-CM | POA: Diagnosis present

## 2021-08-07 DIAGNOSIS — R61 Generalized hyperhidrosis: Secondary | ICD-10-CM | POA: Diagnosis not present

## 2021-08-07 DIAGNOSIS — R079 Chest pain, unspecified: Secondary | ICD-10-CM | POA: Diagnosis not present

## 2021-08-07 DIAGNOSIS — R17 Unspecified jaundice: Secondary | ICD-10-CM

## 2021-08-07 DIAGNOSIS — R160 Hepatomegaly, not elsewhere classified: Secondary | ICD-10-CM | POA: Diagnosis not present

## 2021-08-07 DIAGNOSIS — Z811 Family history of alcohol abuse and dependence: Secondary | ICD-10-CM

## 2021-08-07 DIAGNOSIS — Z7982 Long term (current) use of aspirin: Secondary | ICD-10-CM

## 2021-08-07 LAB — ABO/RH: ABO/RH(D): A POS

## 2021-08-07 LAB — URINALYSIS, COMPLETE (UACMP) WITH MICROSCOPIC
Bacteria, UA: NONE SEEN
Bilirubin Urine: NEGATIVE
Glucose, UA: NEGATIVE mg/dL
Hgb urine dipstick: NEGATIVE
Ketones, ur: NEGATIVE mg/dL
Leukocytes,Ua: NEGATIVE
Nitrite: NEGATIVE
Protein, ur: NEGATIVE mg/dL
Specific Gravity, Urine: 1.009 (ref 1.005–1.030)
pH: 8 (ref 5.0–8.0)

## 2021-08-07 LAB — COMPREHENSIVE METABOLIC PANEL
ALT: 20 U/L (ref 0–44)
AST: 62 U/L — ABNORMAL HIGH (ref 15–41)
Albumin: 2.6 g/dL — ABNORMAL LOW (ref 3.5–5.0)
Alkaline Phosphatase: 209 U/L — ABNORMAL HIGH (ref 38–126)
Anion gap: 11 (ref 5–15)
BUN: 9 mg/dL (ref 6–20)
CO2: 21 mmol/L — ABNORMAL LOW (ref 22–32)
Calcium: 8 mg/dL — ABNORMAL LOW (ref 8.9–10.3)
Chloride: 96 mmol/L — ABNORMAL LOW (ref 98–111)
Creatinine, Ser: 0.69 mg/dL (ref 0.61–1.24)
GFR, Estimated: >60 mL/min (ref >60)
Glucose, Bld: 145 mg/dL — ABNORMAL HIGH (ref 70–99)
Potassium: 2.4 mmol/L — CL (ref 3.5–5.1)
Sodium: 128 mmol/L — ABNORMAL LOW (ref 135–145)
Total Bilirubin: 3.9 mg/dL — ABNORMAL HIGH (ref 0.3–1.2)
Total Protein: 7.6 g/dL (ref 6.5–8.1)

## 2021-08-07 LAB — URINE DRUG SCREEN, QUALITATIVE (ARMC ONLY)
Amphetamines, Ur Screen: NOT DETECTED
Barbiturates, Ur Screen: NOT DETECTED
Benzodiazepine, Ur Scrn: NOT DETECTED
Cannabinoid 50 Ng, Ur ~~LOC~~: POSITIVE — AB
Cocaine Metabolite,Ur ~~LOC~~: NOT DETECTED
MDMA (Ecstasy)Ur Screen: NOT DETECTED
Methadone Scn, Ur: NOT DETECTED
Opiate, Ur Screen: NOT DETECTED
Phencyclidine (PCP) Ur S: NOT DETECTED
Tricyclic, Ur Screen: NOT DETECTED

## 2021-08-07 LAB — CBC
HCT: 21.9 % — ABNORMAL LOW (ref 39.0–52.0)
Hemoglobin: 7.6 g/dL — ABNORMAL LOW (ref 13.0–17.0)
MCH: 31.1 pg (ref 26.0–34.0)
MCHC: 34.7 g/dL (ref 30.0–36.0)
MCV: 89.8 fL (ref 80.0–100.0)
Platelets: 269 10*3/uL (ref 150–400)
RBC: 2.44 MIL/uL — ABNORMAL LOW (ref 4.22–5.81)
RDW: 22.3 % — ABNORMAL HIGH (ref 11.5–15.5)
WBC: 11.8 10*3/uL — ABNORMAL HIGH (ref 4.0–10.5)
nRBC: 0.3 % — ABNORMAL HIGH (ref 0.0–0.2)

## 2021-08-07 LAB — RESP PANEL BY RT-PCR (FLU A&B, COVID) ARPGX2
Influenza A by PCR: NEGATIVE
Influenza B by PCR: NEGATIVE
SARS Coronavirus 2 by RT PCR: NEGATIVE

## 2021-08-07 LAB — HEMOGLOBIN AND HEMATOCRIT, BLOOD
HCT: 19.6 % — ABNORMAL LOW (ref 39.0–52.0)
Hemoglobin: 6.7 g/dL — ABNORMAL LOW (ref 13.0–17.0)

## 2021-08-07 LAB — BASIC METABOLIC PANEL
Anion gap: 8 (ref 5–15)
BUN: 8 mg/dL (ref 6–20)
CO2: 20 mmol/L — ABNORMAL LOW (ref 22–32)
Calcium: 7.8 mg/dL — ABNORMAL LOW (ref 8.9–10.3)
Chloride: 104 mmol/L (ref 98–111)
Creatinine, Ser: 0.62 mg/dL (ref 0.61–1.24)
GFR, Estimated: >60 mL/min (ref >60)
Glucose, Bld: 115 mg/dL — ABNORMAL HIGH (ref 70–99)
Potassium: 2.8 mmol/L — ABNORMAL LOW (ref 3.5–5.1)
Sodium: 132 mmol/L — ABNORMAL LOW (ref 135–145)

## 2021-08-07 LAB — D-DIMER, QUANTITATIVE: D-Dimer, Quant: 0.56 ug/mL-FEU — ABNORMAL HIGH (ref 0.00–0.50)

## 2021-08-07 LAB — PREPARE RBC (CROSSMATCH)

## 2021-08-07 LAB — PROTIME-INR
INR: 1.1 (ref 0.8–1.2)
Prothrombin Time: 14.5 seconds (ref 11.4–15.2)

## 2021-08-07 LAB — ACETAMINOPHEN LEVEL: Acetaminophen (Tylenol), Serum: <10 ug/mL — ABNORMAL LOW (ref 10–30)

## 2021-08-07 LAB — TROPONIN I (HIGH SENSITIVITY): Troponin I (High Sensitivity): 6 ng/L (ref <18)

## 2021-08-07 LAB — LIPASE, BLOOD: Lipase: 68 U/L — ABNORMAL HIGH (ref 11–51)

## 2021-08-07 LAB — PHOSPHORUS: Phosphorus: 1.4 mg/dL — ABNORMAL LOW (ref 2.5–4.6)

## 2021-08-07 LAB — MAGNESIUM: Magnesium: 1.7 mg/dL (ref 1.7–2.4)

## 2021-08-07 LAB — ETHANOL: Alcohol, Ethyl (B): <10 mg/dL (ref <10)

## 2021-08-07 LAB — APTT: aPTT: 31 seconds (ref 24–36)

## 2021-08-07 IMAGING — US US ABDOMEN LIMITED
1 series · 14 of 25 positions shown · non-contrast
Comparison: [DATE]

CLINICAL DATA: Abdominal pain for 1 month, nausea and vomiting,
diaphoresis, epigastric pain

EXAM:
ULTRASOUND ABDOMEN LIMITED RIGHT UPPER QUADRANT

[Series 1: us abdomen limited ruq (liver/gb) · 14 of 54 slices shown]
[im 1/54]
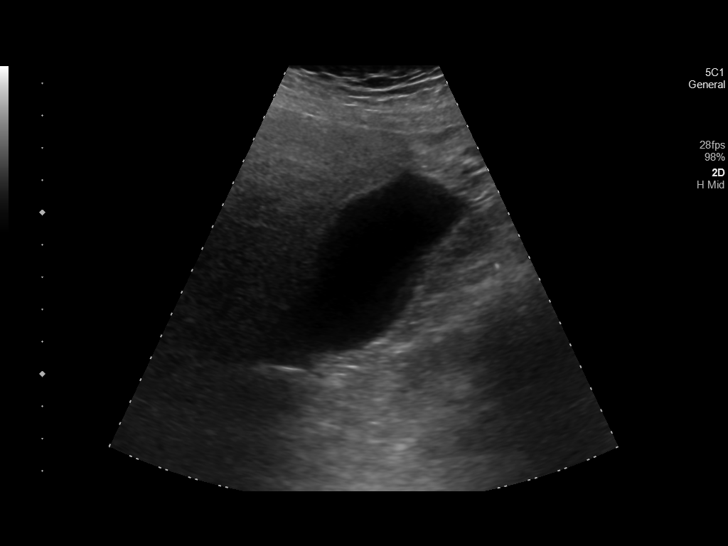
[im 5/54]
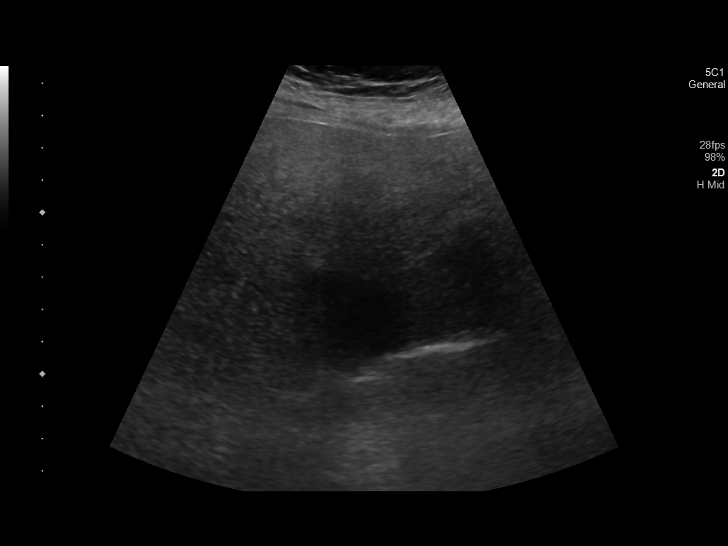
[im 9/54]
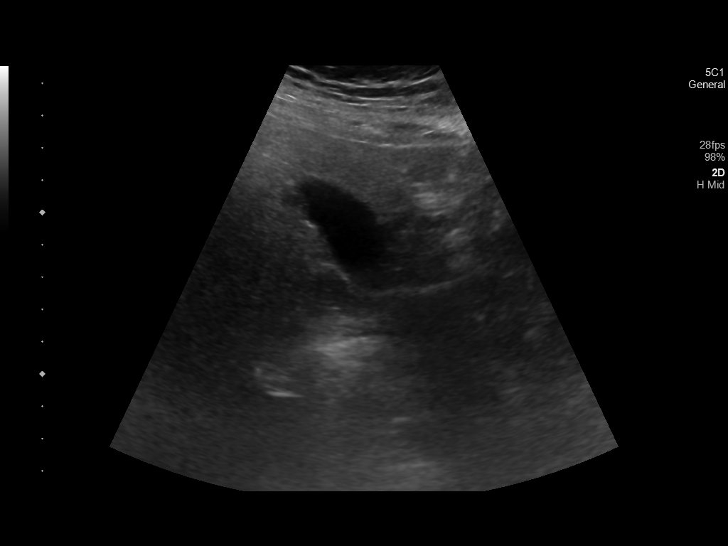
[im 14/54]
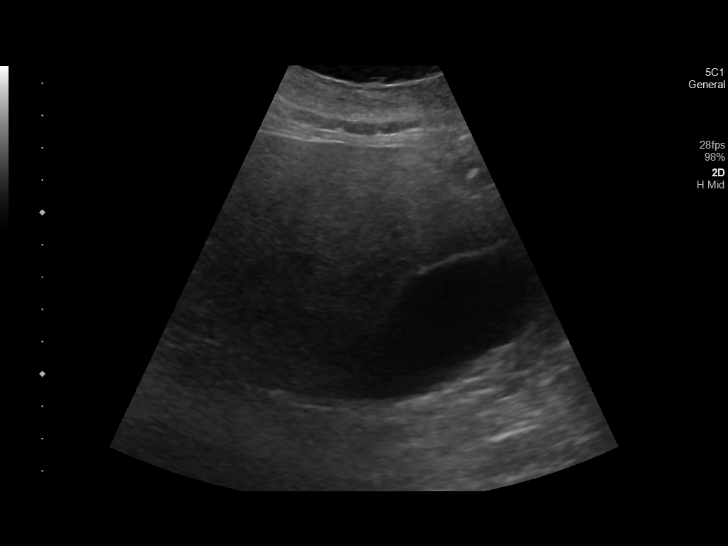
[im 18/54]
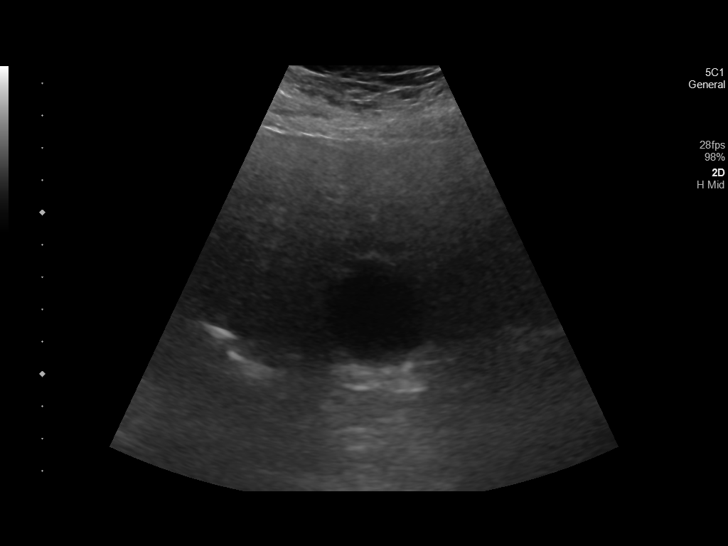
[im 20/54]
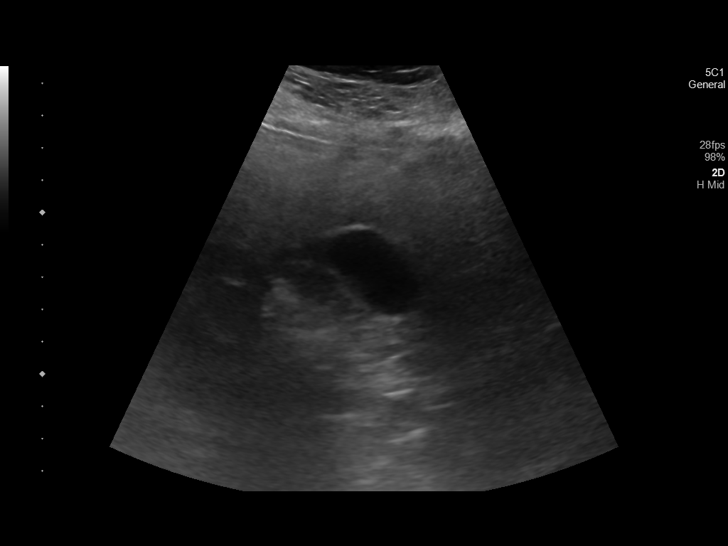
[im 25/54]
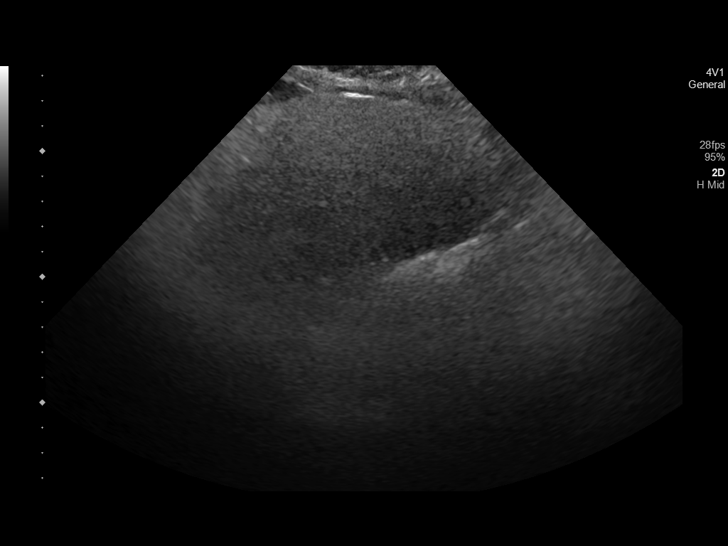
[im 29/54]
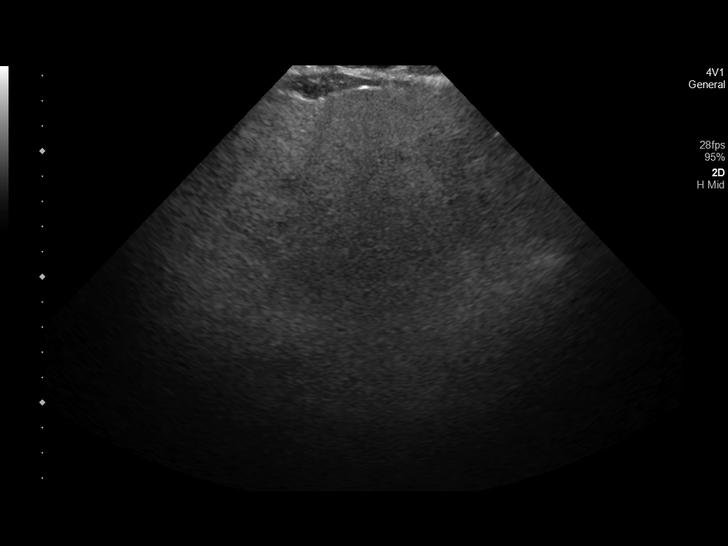
[im 34/54]
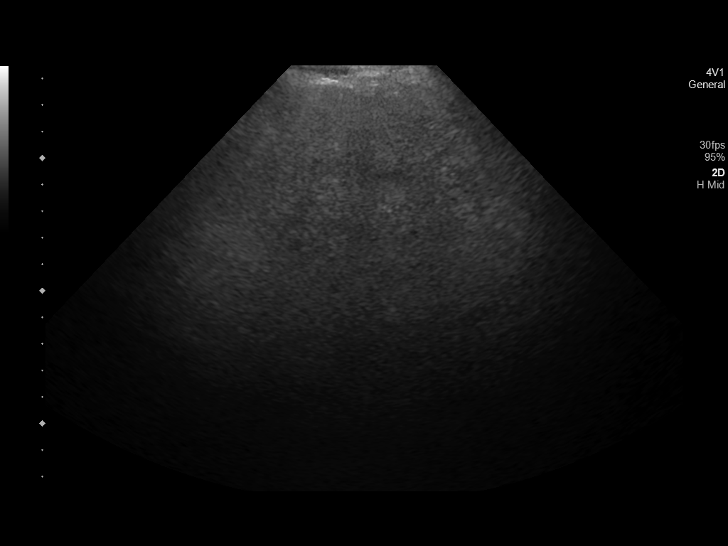
[im 36/54]
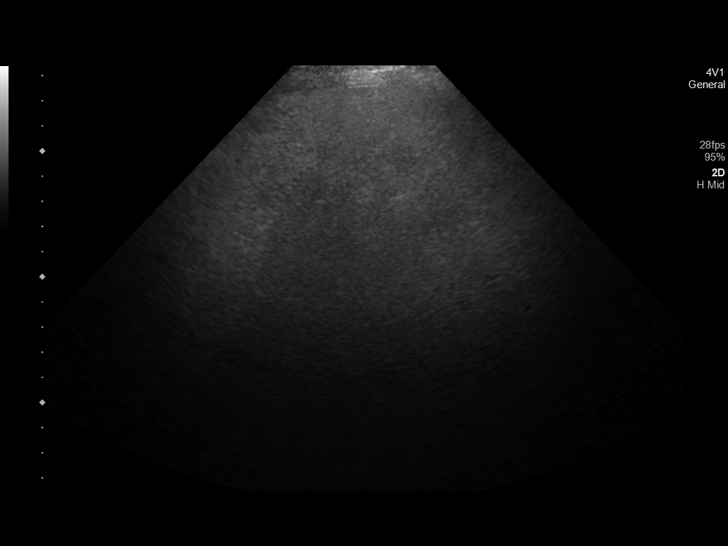
[im 40/54]
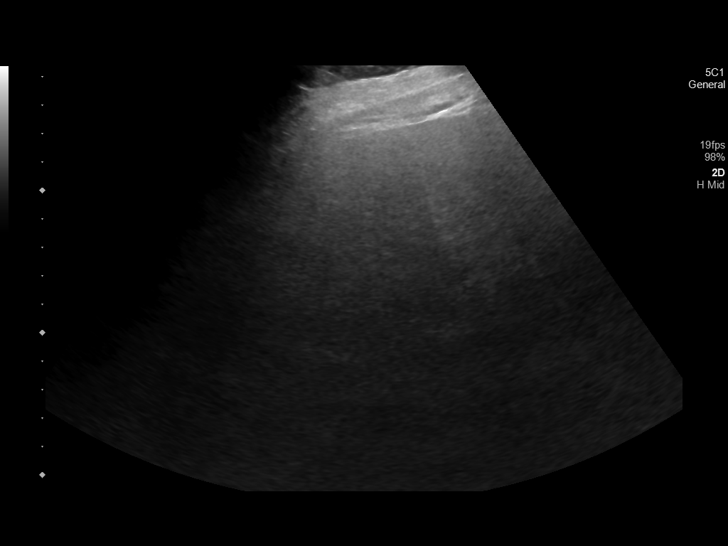
[im 45/54]
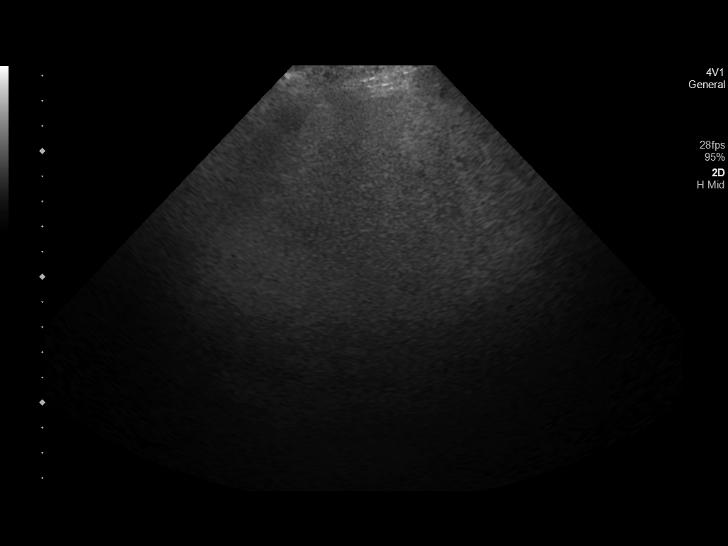
[im 49/54]
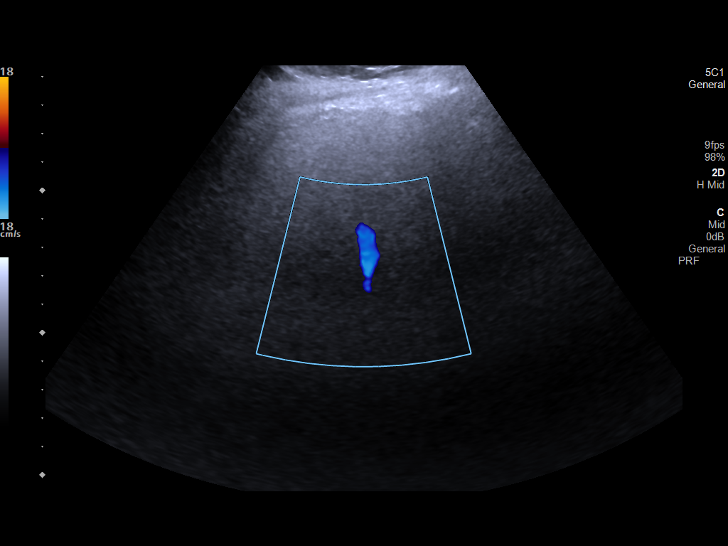
[im 54/54]
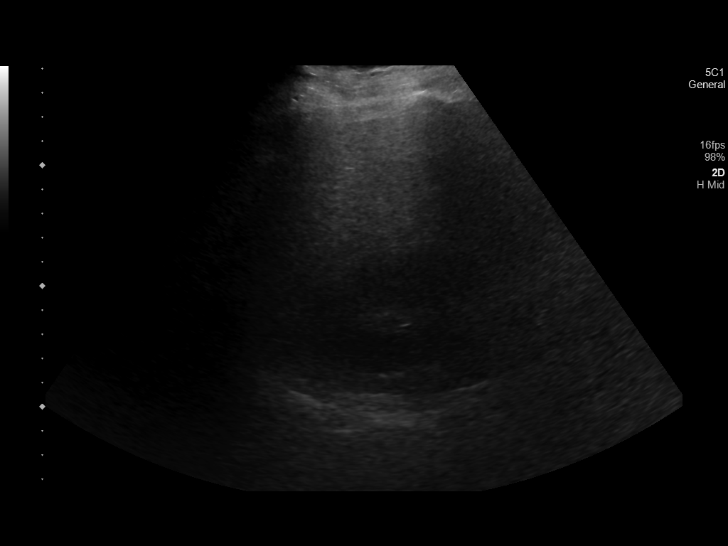

[14 of 25 positions shown; findings below may reference images not displayed]

FINDINGS: Gallbladder:

No gallstones or wall thickening visualized. No sonographic Murphy
sign noted by sonographer.

Common bile duct:

Diameter: 2 mm

Liver:

There is diffuse increased liver echotexture consistent with hepatic
steatosis seen on preceding CT. Evaluation is limited by body
habitus. Portal vein is patent on color Doppler imaging with normal
direction of blood flow towards the liver.

Other: None.
IMPRESSION: 1. Limited study due to body habitus.
2. Hepatic steatosis.

## 2021-08-07 IMAGING — CT CT ABD-PELV W/ CM
2 of 5 series · 16 of 46 positions shown, 18 images · IV contrast (omnipaque)
Comparison: [DATE]

CLINICAL DATA: Persistent upper abdominal pain for 1 month, nausea
and vomiting, diaphoresis, pain with inspiration, back pain

EXAM:
CT ANGIOGRAPHY CHEST
CT ABDOMEN AND PELVIS WITH CONTRAST
TECHNIQUE: Multidetector CT imaging of the chest was performed using the
standard protocol during bolus administration of intravenous
contrast. Multiplanar CT image reconstructions and MIPs were
obtained to evaluate the vascular anatomy. Multidetector CT imaging
of the abdomen and pelvis was performed using the standard protocol
during bolus administration of intravenous contrast.
CONTRAST:  100mL OMNIPAQUE IOHEXOL 350 MG/ML SOLN

[Series 2: axial st · axial · 0.92mm/px · z∈[-1094,-674]mm · 13 of 96 slices shown, 15 images]
[im 6/96  soft-tissue]
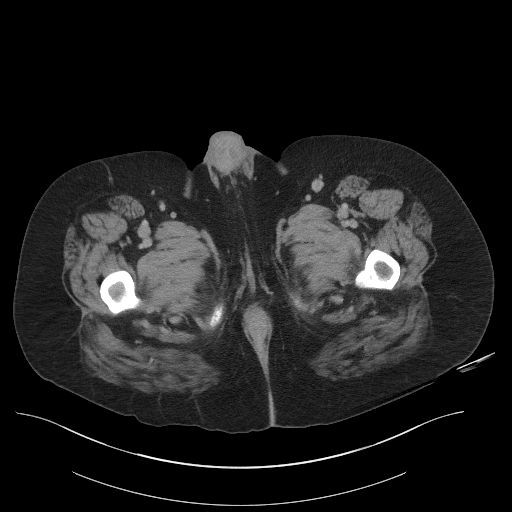
[im 6/96  bone]
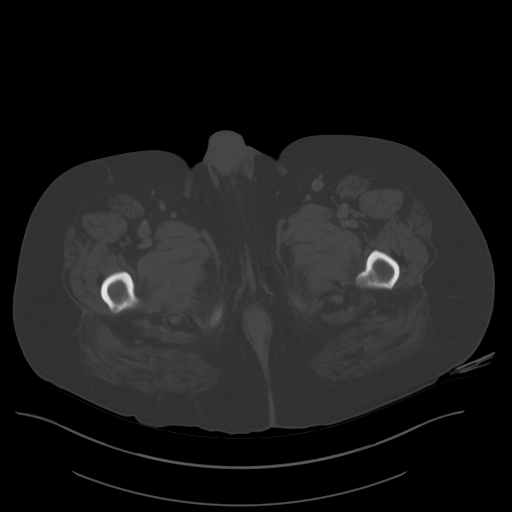
[im 11/96  soft-tissue]
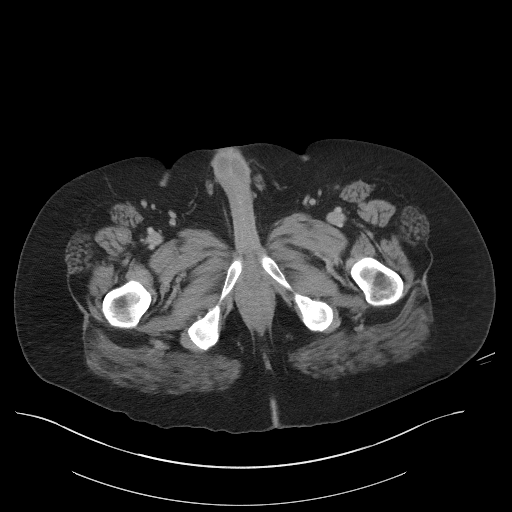
[im 22/96  soft-tissue]
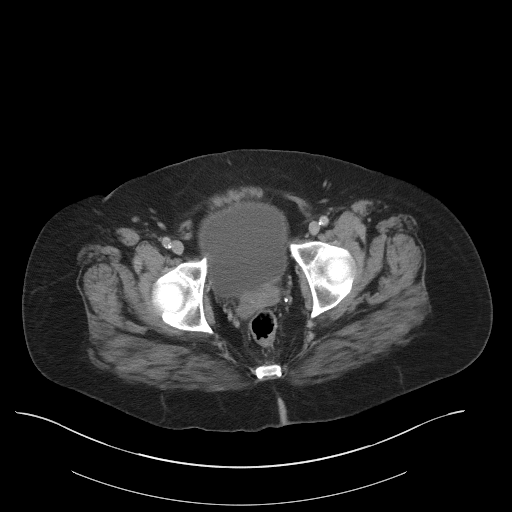
[im 27/96  soft-tissue]
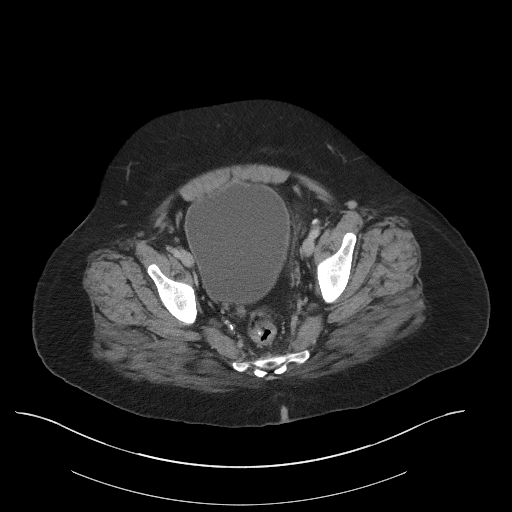
[im 32/96  soft-tissue]
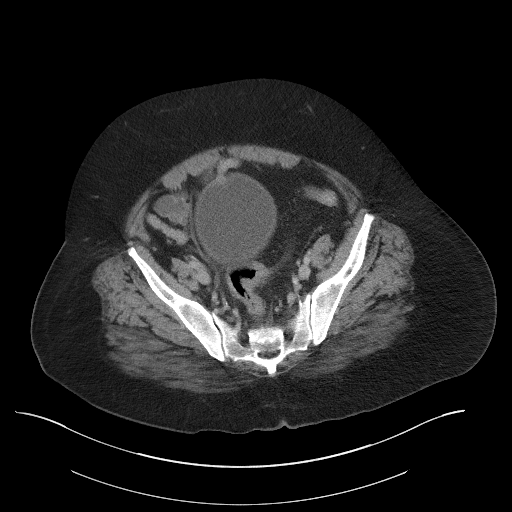
[im 43/96  soft-tissue]
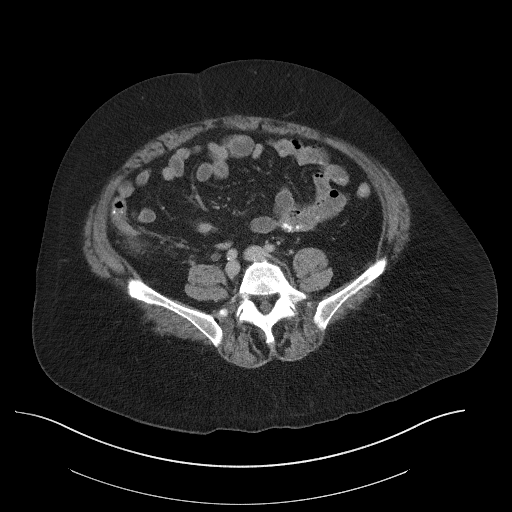
[im 48/96  soft-tissue]
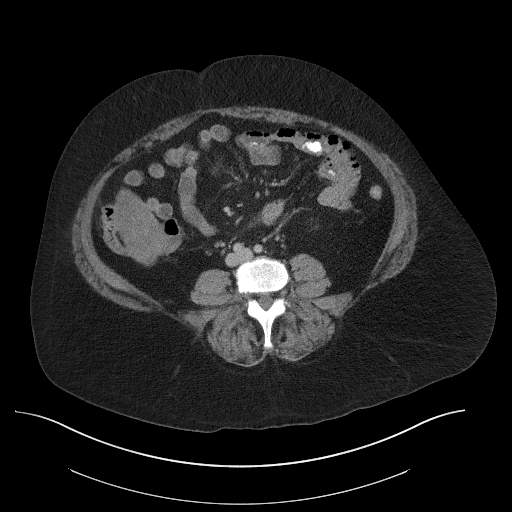
[im 53/96  soft-tissue]
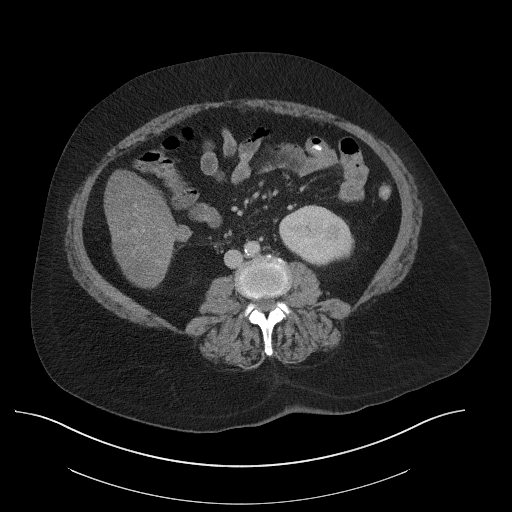
[im 64/96  soft-tissue]
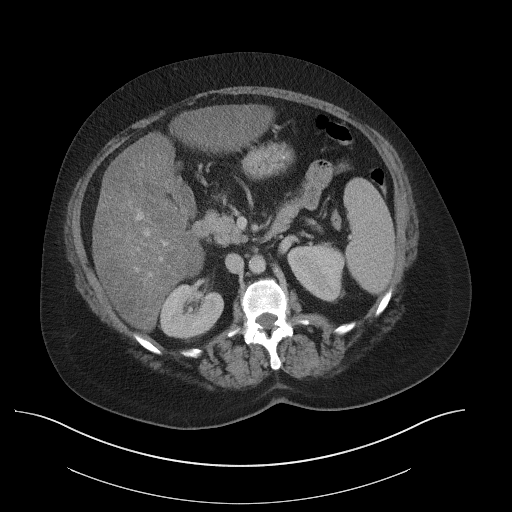
[im 64/96  bone]
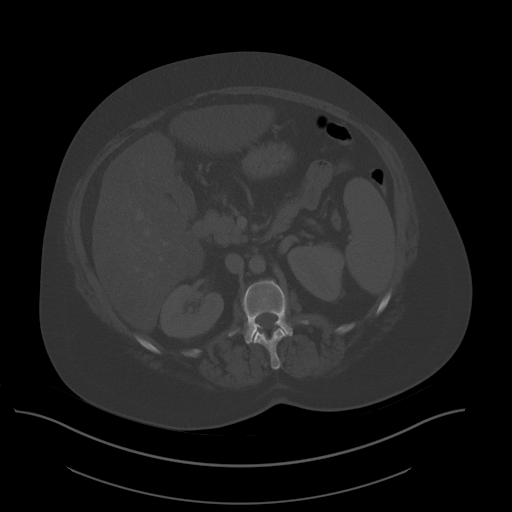
[im 69/96  soft-tissue]
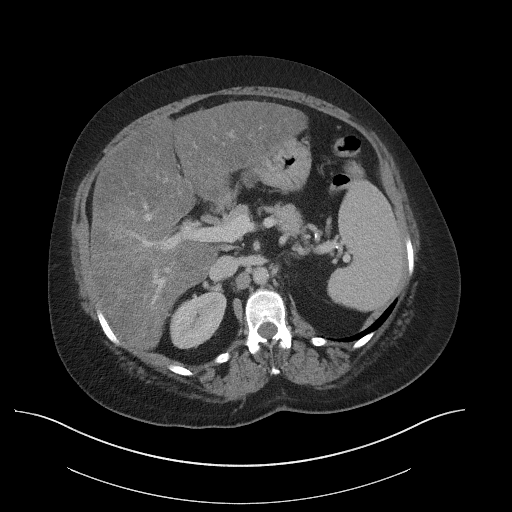
[im 74/96  soft-tissue]
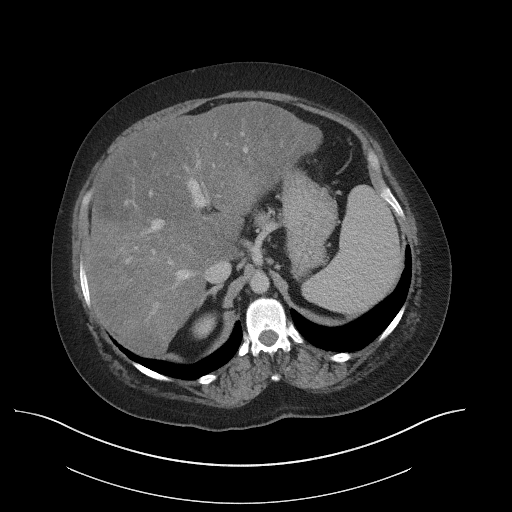
[im 85/96  soft-tissue]
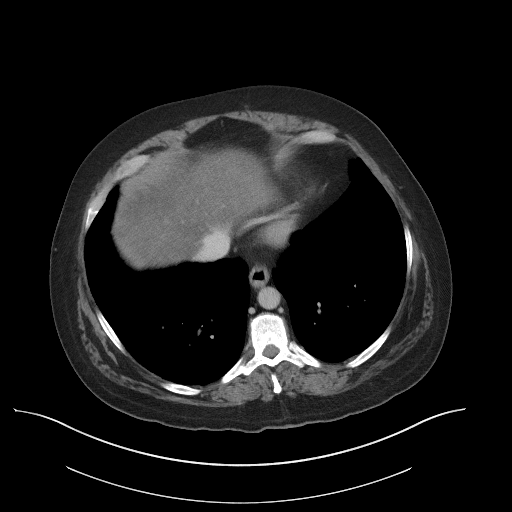
[im 90/96  soft-tissue]
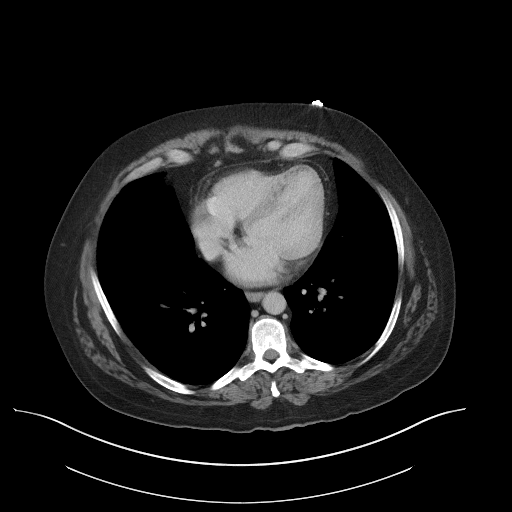

[Series 5: coronal st · coronal · 0.87mm/px · 3 of 118 slices shown]
[im 40/118  soft-tissue]
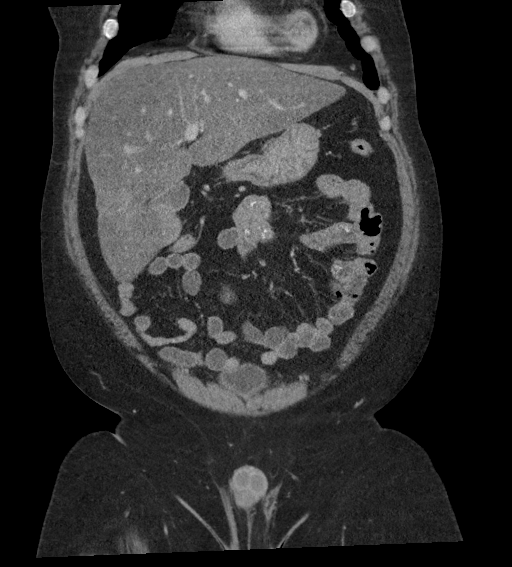
[im 53/118  soft-tissue]
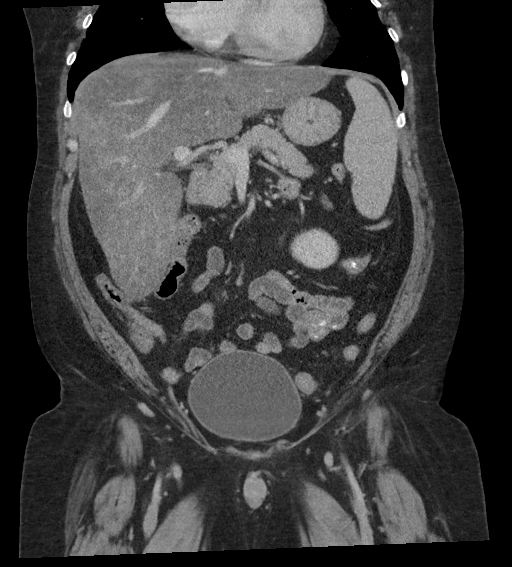
[im 66/118  soft-tissue]
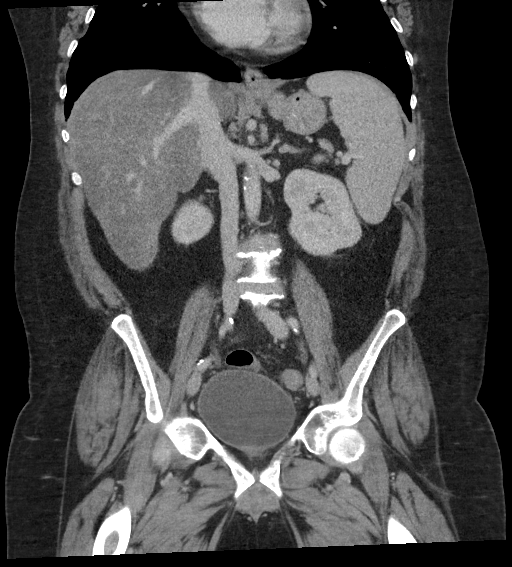

[16 of 46 positions shown; findings below may reference images not displayed]

FINDINGS: CTA CHEST FINDINGS

Cardiovascular: There is technically adequate opacification of the
pulmonary vasculature. No filling defects or pulmonary emboli.

No evidence of thoracic aortic aneurysm or dissection. Mild
atherosclerosis of the aortic arch.

The heart is not enlarged. No pericardial effusion. Mild
atherosclerosis of the coronary vasculature.

Mediastinum/Nodes: No enlarged mediastinal, hilar, or axillary lymph
nodes. Thyroid gland, trachea, and esophagus demonstrate no
significant findings.

Lungs/Pleura: No acute airspace disease, effusion, or pneumothorax.
Stable minimal subpleural scarring at the lung bases. Central
airways are patent.

Musculoskeletal: No acute or destructive bony lesions. Reconstructed
images demonstrate no additional findings.

Review of the MIP images confirms the above findings.

CT ABDOMEN and PELVIS FINDINGS

Hepatobiliary: Liver is enlarged, with heterogeneous fatty
infiltration again noted. No focal liver abnormality. The
gallbladder is unremarkable. No biliary duct dilation.

Pancreas: Unremarkable. No pancreatic ductal dilatation or
surrounding inflammatory changes.

Spleen: Spleen is mildly enlarged measuring 13.8 x 12.0 x 5.9 cm. No
focal parenchymal abnormality.

Adrenals/Urinary Tract: Adrenal glands are unremarkable. Kidneys are
normal, without renal calculi, focal lesion, or hydronephrosis.
Bladder is unremarkable.

Stomach/Bowel: No bowel obstruction or ileus. No bowel wall
thickening or inflammatory change.

Vascular/Lymphatic: Aortic atherosclerosis. No enlarged abdominal or
pelvic lymph nodes.

Reproductive: Prostate is unremarkable.

Other: No free fluid or free gas.  No abdominal wall hernia.

Musculoskeletal: No acute or destructive bony lesions. Reconstructed
images demonstrate no additional findings.

Review of the MIP images confirms the above findings.
IMPRESSION: 1. No acute intrathoracic process.
2. No evidence of pulmonary embolus.
3. No evidence of aortic aneurysm or dissection.
4. Hepatic steatosis.
5. Borderline splenomegaly.
6.  Aortic Atherosclerosis ([CO]-[CO]).

## 2021-08-07 IMAGING — CR DG CHEST 2V
1 series · 2 of 2 positions shown · non-contrast
Comparison: [DATE]

CLINICAL DATA: Upper abdominal pain for 1 month, nausea vomiting,
diaphoresis, short of breath

EXAM:
CHEST - 2 VIEW

[Series 1: dg chest 2 view · 0.14mm/px · 2 of 2 slices shown]
[im 1/2]
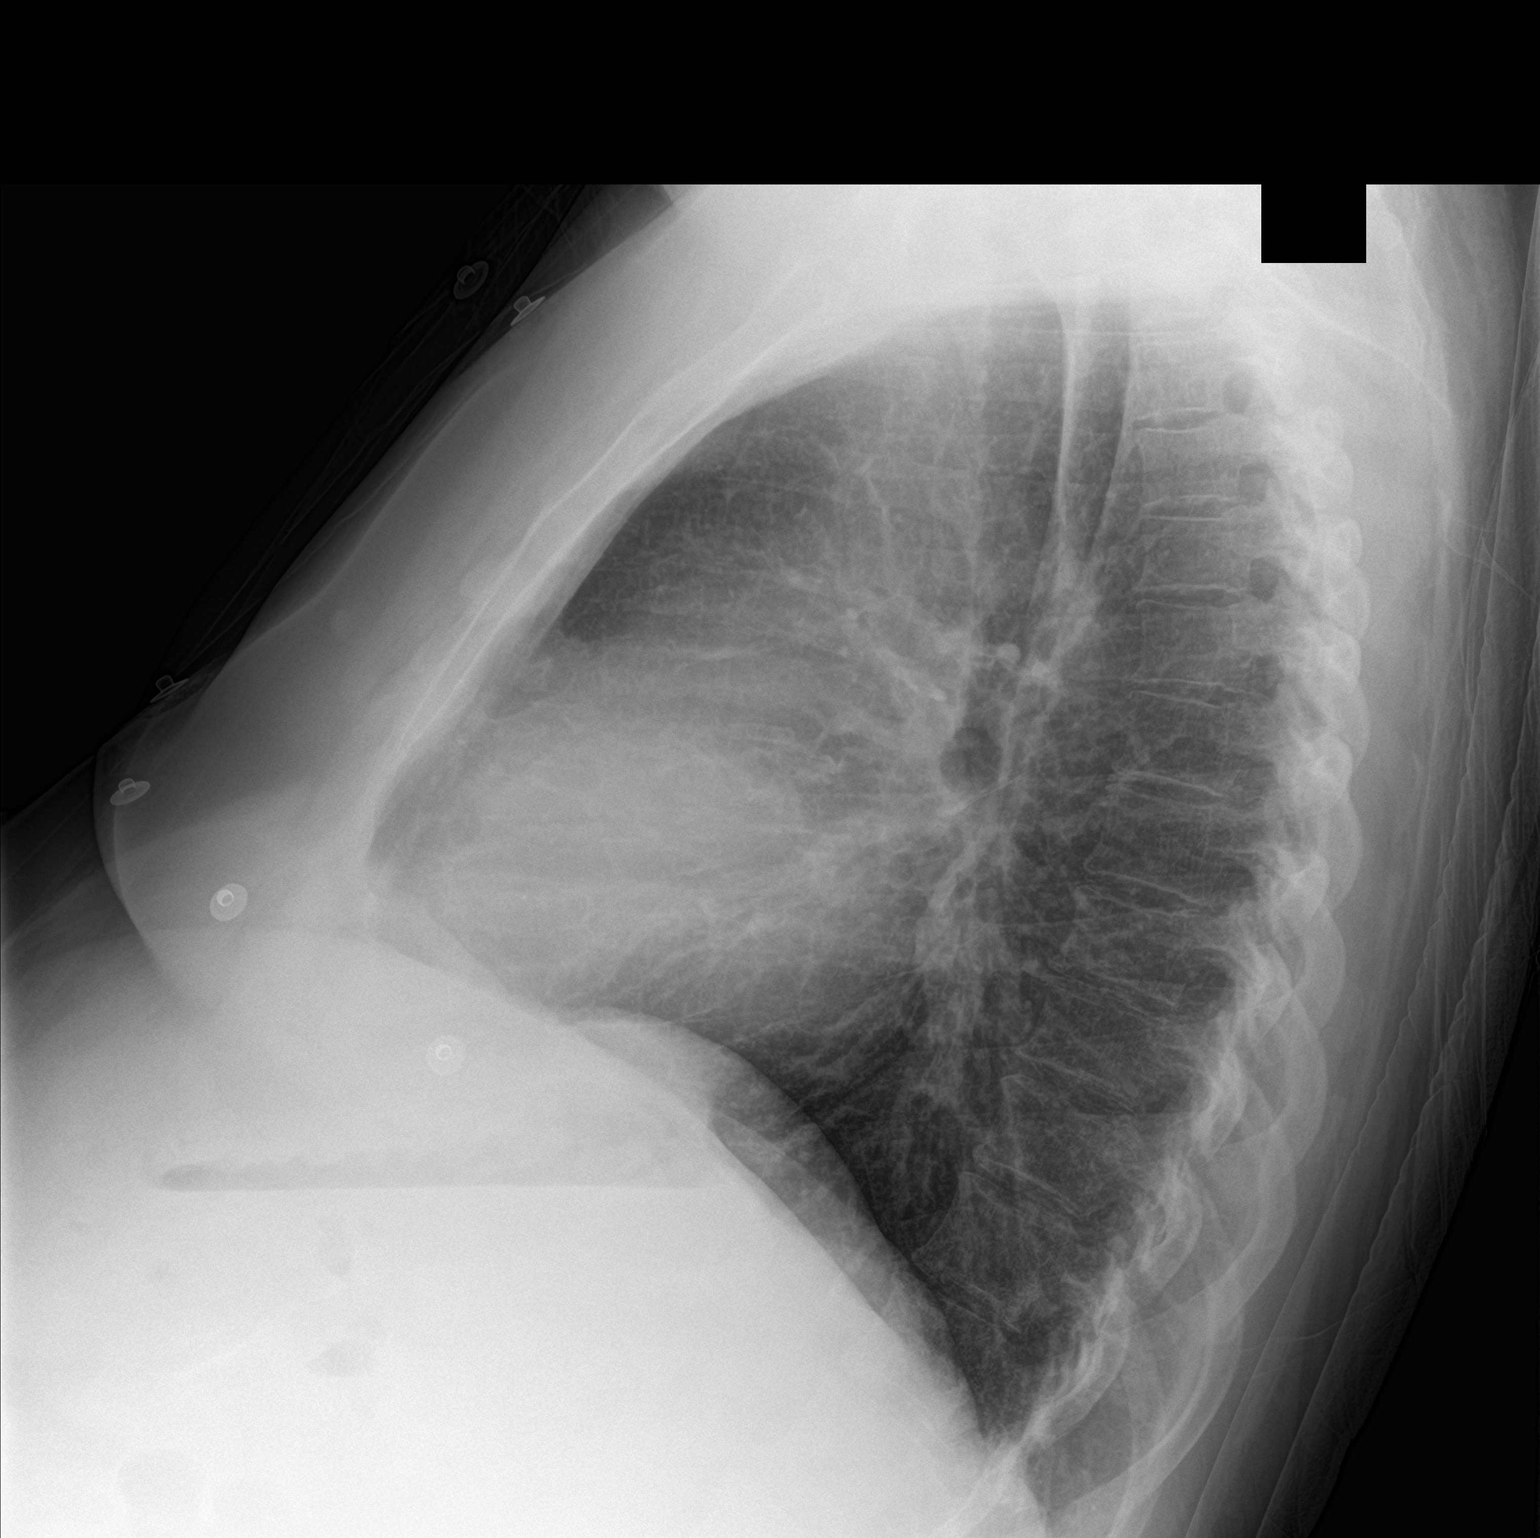
[im 2/2]
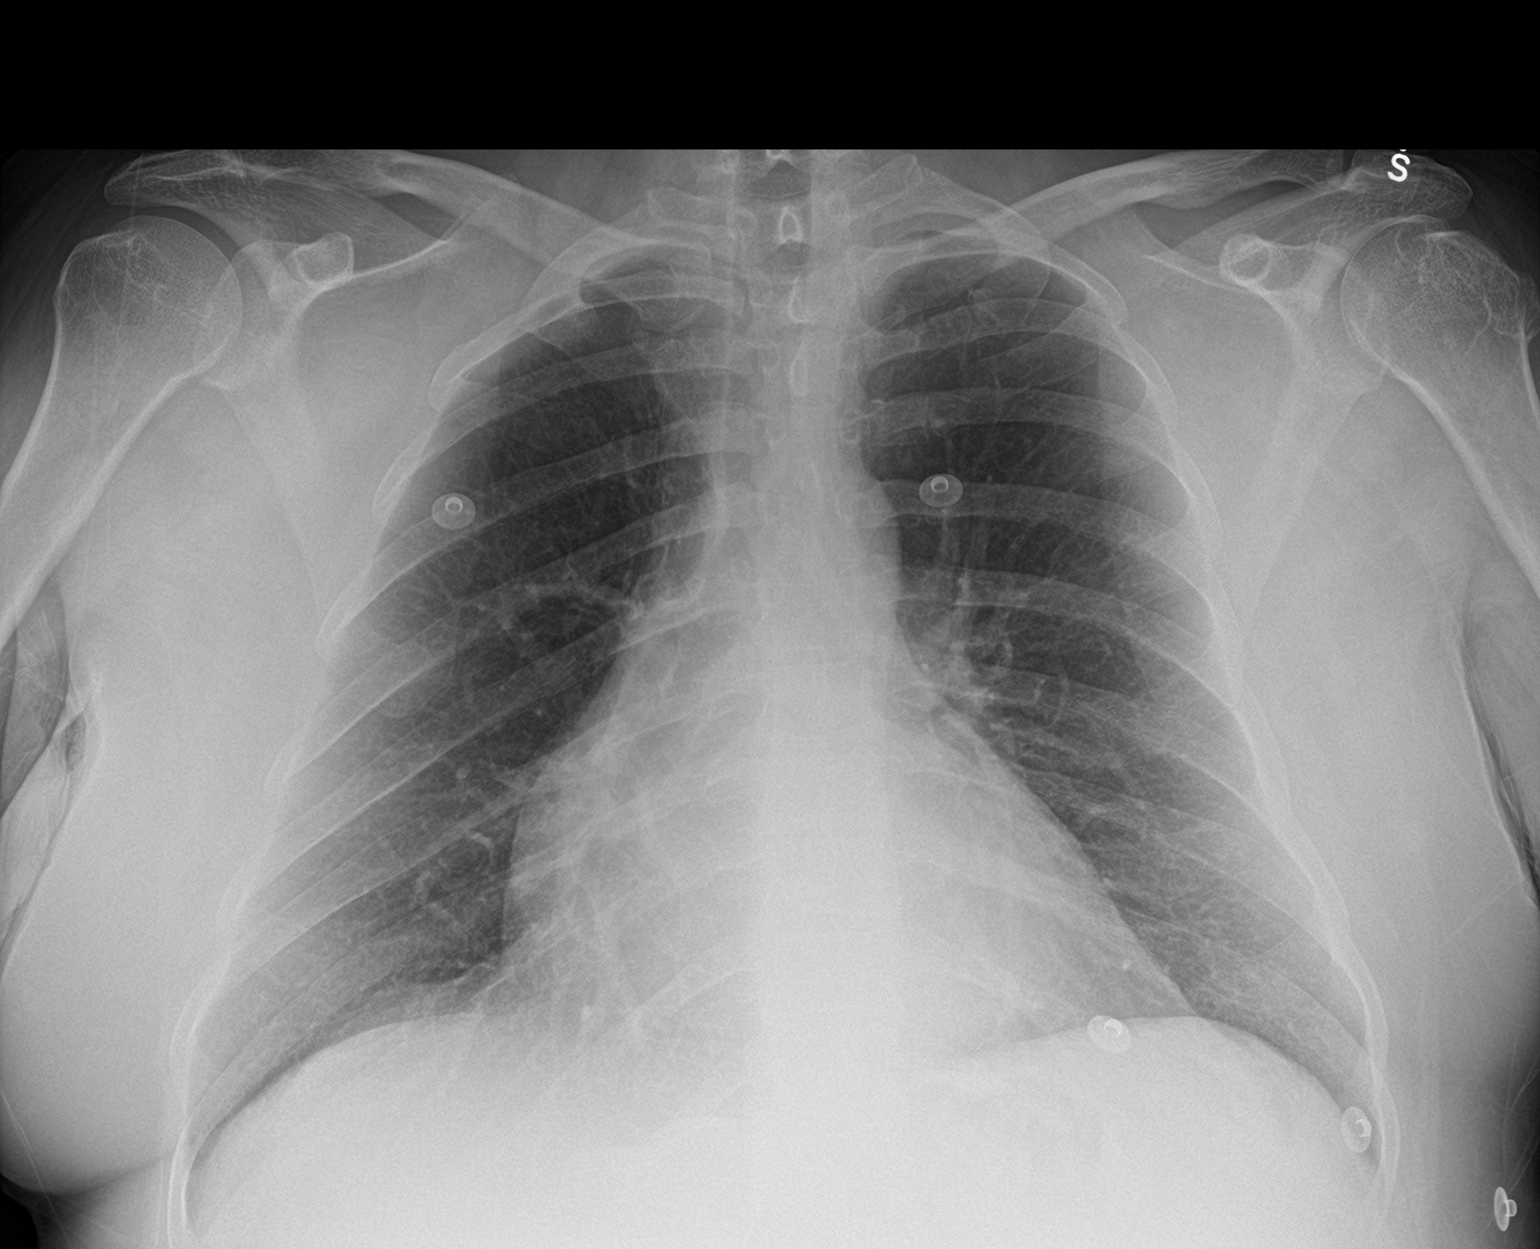

[2 of 2 positions shown; findings below may reference images not displayed]

FINDINGS: Frontal and lateral views of the chest demonstrate an unremarkable
cardiac silhouette. No airspace disease, effusion, or pneumothorax.
No acute bony abnormalities.
IMPRESSION: 1. No acute intrathoracic process.

## 2021-08-07 MED ORDER — POTASSIUM CHLORIDE IN NACL 20-0.9 MEQ/L-% IV SOLN
Freq: Once | INTRAVENOUS | Status: AC
  Filled 2021-08-07: qty 1000

## 2021-08-07 MED ORDER — LORAZEPAM 1 MG PO TABS: 1.0000 mg | ORAL_TABLET | ORAL | Status: DC | PRN

## 2021-08-07 MED ORDER — PANTOPRAZOLE SODIUM 40 MG IV SOLR
40.0000 mg | Freq: Two times a day (BID) | INTRAVENOUS | Status: DC
  Administered 2021-08-07 – 2021-08-11 (×8): 40 mg via INTRAVENOUS
  Filled 2021-08-07 (×8): qty 40

## 2021-08-07 MED ORDER — ADULT MULTIVITAMIN W/MINERALS CH
1.0000 | ORAL_TABLET | Freq: Every day | ORAL | Status: DC
  Administered 2021-08-09 – 2021-08-11 (×3): 1 via ORAL
  Filled 2021-08-07 (×4): qty 1

## 2021-08-07 MED ORDER — ONDANSETRON HCL 4 MG/2ML IJ SOLN
4.0000 mg | Freq: Once | INTRAMUSCULAR | Status: AC
  Administered 2021-08-07: 4 mg via INTRAVENOUS
  Filled 2021-08-07: qty 2

## 2021-08-07 MED ORDER — IOHEXOL 350 MG/ML SOLN
100.0000 mL | Freq: Once | INTRAVENOUS | Status: AC | PRN
  Administered 2021-08-07: 100 mL via INTRAVENOUS

## 2021-08-07 MED ORDER — FOLIC ACID 1 MG PO TABS
1.0000 mg | ORAL_TABLET | Freq: Every day | ORAL | Status: DC
  Administered 2021-08-09 – 2021-08-11 (×3): 1 mg via ORAL
  Filled 2021-08-07 (×4): qty 1

## 2021-08-07 MED ORDER — HYDROMORPHONE HCL 1 MG/ML IJ SOLN
1.0000 mg | Freq: Once | INTRAMUSCULAR | Status: AC
  Administered 2021-08-07: 1 mg via INTRAVENOUS
  Filled 2021-08-07: qty 1

## 2021-08-07 MED ORDER — KCL IN DEXTROSE-NACL 40-5-0.9 MEQ/L-%-% IV SOLN
INTRAVENOUS | Status: DC
  Filled 2021-08-07 (×2): qty 1000

## 2021-08-07 MED ORDER — LORAZEPAM 2 MG/ML IJ SOLN: 0.0000 mg | Freq: Four times a day (QID) | INTRAMUSCULAR | Status: DC

## 2021-08-07 MED ORDER — ONDANSETRON HCL 4 MG/2ML IJ SOLN: 4.0000 mg | Freq: Four times a day (QID) | INTRAMUSCULAR | Status: DC | PRN

## 2021-08-07 MED ORDER — MAGNESIUM SULFATE 2 GM/50ML IV SOLN
2.0000 g | Freq: Once | INTRAVENOUS | Status: AC
  Administered 2021-08-08: 2 g via INTRAVENOUS
  Filled 2021-08-07: qty 50

## 2021-08-07 MED ORDER — LORAZEPAM 2 MG/ML IJ SOLN: 0.0000 mg | Freq: Two times a day (BID) | INTRAMUSCULAR | Status: DC

## 2021-08-07 MED ORDER — PANTOPRAZOLE SODIUM 40 MG IV SOLR
40.0000 mg | Freq: Once | INTRAVENOUS | Status: AC
  Administered 2021-08-07: 40 mg via INTRAVENOUS
  Filled 2021-08-07: qty 40

## 2021-08-07 MED ORDER — SODIUM CHLORIDE 0.9 % IV BOLUS
1000.0000 mL | Freq: Once | INTRAVENOUS | Status: AC
  Administered 2021-08-07: 1000 mL via INTRAVENOUS

## 2021-08-07 MED ORDER — SODIUM CHLORIDE 0.9% IV SOLUTION: Freq: Once | INTRAVENOUS | Status: AC

## 2021-08-07 MED ORDER — ONDANSETRON HCL 4 MG PO TABS: 4.0000 mg | ORAL_TABLET | Freq: Four times a day (QID) | ORAL | Status: DC | PRN

## 2021-08-07 MED ORDER — HYDROMORPHONE HCL 1 MG/ML IJ SOLN
1.0000 mg | INTRAMUSCULAR | Status: DC | PRN
  Administered 2021-08-07 – 2021-08-08 (×2): 1 mg via INTRAVENOUS
  Filled 2021-08-07 (×2): qty 1

## 2021-08-07 MED ORDER — POTASSIUM CHLORIDE CRYS ER 20 MEQ PO TBCR
40.0000 meq | EXTENDED_RELEASE_TABLET | Freq: Once | ORAL | Status: AC
  Administered 2021-08-07: 40 meq via ORAL
  Filled 2021-08-07: qty 2

## 2021-08-07 MED ORDER — LORAZEPAM 2 MG/ML IJ SOLN: 1.0000 mg | INTRAMUSCULAR | Status: DC | PRN

## 2021-08-07 MED ORDER — THIAMINE HCL 100 MG/ML IJ SOLN
100.0000 mg | Freq: Every day | INTRAMUSCULAR | Status: DC
  Administered 2021-08-07 – 2021-08-08 (×2): 100 mg via INTRAVENOUS
  Filled 2021-08-07 (×2): qty 2

## 2021-08-07 MED ORDER — POTASSIUM CHLORIDE 10 MEQ/100ML IV SOLN
10.0000 meq | INTRAVENOUS | Status: AC
Start: 2021-08-08 — End: 2021-08-08
  Administered 2021-08-08: 10 meq via INTRAVENOUS
  Filled 2021-08-07: qty 100

## 2021-08-07 MED ORDER — HYDROMORPHONE HCL 1 MG/ML IJ SOLN
0.5000 mg | Freq: Once | INTRAMUSCULAR | Status: AC
  Administered 2021-08-07: 0.5 mg via INTRAVENOUS
  Filled 2021-08-07: qty 1

## 2021-08-07 MED ORDER — SODIUM CHLORIDE 0.9 % IV SOLN: INTRAVENOUS | Status: DC

## 2021-08-07 MED ORDER — THIAMINE HCL 100 MG PO TABS
100.0000 mg | ORAL_TABLET | Freq: Every day | ORAL | Status: DC
  Administered 2021-08-09 – 2021-08-11 (×3): 100 mg via ORAL
  Filled 2021-08-07 (×4): qty 1

## 2021-08-07 NOTE — ED Notes (Signed)
Pt is alert and oriented lying in bed and given a urinal.

## 2021-08-07 NOTE — ED Provider Notes (Signed)
[facility] Emergency Department Provider Note  ____________________________________________   Event Date/Time   First MD Initiated Contact with Patient 08/07/21 1630     (approximate)  I have reviewed the triage vital signs and the nursing notes.   HISTORY  Chief Complaint Abdominal Pain    HPI Ethan Wells is a 50 y.o. male with coronary disease status post cardiac arrest in the setting of a STEMI, substance abuse, ischemic cardiomyopathy who comes in with concerns for abdominal pain.  Patient was given 500 cc of fluid with EMS.  Patient reports that for the past 3 weeks has not been able to eat or drink really anything secondary to upper abdominal pain that is constant, nothing makes it better, nothing makes it worse.  He states that he is also been feeling short of breath with a little bit of chest pain and pleuritic component as well.  Denies any leg swelling.  Denies a history of blood clots.  Patient does report that he used to drink daily but he has not been able to drink any alcohol for the past 3 weeks or smokes cigarettes for over a month.  He denies any drugs other than marijuana.   On review of records patient did undergo endoscopy Normal duodenal bulb and second portion of the duodenum., Alcoholic gastritis. Medium-sized hiatal hernia.Salmon-colored mucosa suspicious for long-segment Barrett's esophagus.          Past Medical History:  Diagnosis Date   CAD (coronary artery disease)    a. 01/2011 Anterior STEMI/Cath/PCI: LM nl, LAD 100d (3.5x22m Vision BMS placed), LCX 262mRI 50, RCA min irregs, EF 40% w/ apical, inferoapical HK.   Cardiac arrest - ventricular fibrillation    a. In setting of STEMI 01/2011   Chronic pain    Cocaine abuse (HCRafael Cap[place]   Depression with anxiety    ETOH abuse    a. 6-12 beers/day   GERD (gastroesophageal reflux disease)    Hemorrhoids    Hyperlipidemia    Hypertension    Ischemic cardiomyopathy    a.  06/2011 Echo: EF 45-50%, No rwma   Marijuana abuse    Migraines    Tobacco abuse    a. 1/2 ppd x 26 yrs    Patient Active Problem List   Diagnosis Date Noted   Elevated troponin    Notalgia    NSTEMI (non-ST elevated myocardial infarction) (HC[place]01/03/2021   History of ST elevation myocardial infarction (STEMI) 12/19/2020   Alcohol use disorder, moderate, dependence (HCDacono01/03/2021   Depression with anxiety 10/05/2020   Esophageal reflux 10/05/2020   Headache syndrome 10/05/2020   Hyperlipidemia 10/05/2020   Hyponatremia 06/04/2020   Hypomagnesemia [id number] Chronic systolic CHF (congestive heart failure) (HCFair[place]06/20/2021   Leukocytosis 06/04/2020   Abdominal pain 06/04/2020   Hypokalemia 06/04/2020   Ischemic cardiomyopathy    Polysubstance abuse (HC[place]   Chest pain 07/12/2019   Abdominal pain, RUQ 05/25/2018   Chest pain, mid sternal 11/17/2012   Cocaine abuse (HCR[place]   Marijuana abuse    TOBACCO ABUSE 02/26/2011   HYPERTENSION, BENIGN 02/26/2011   CAD, NATIVE VESSEL 02/26/2011   Other specified forms of chronic ischemic heart disease 02/26/2011    Past Surgical History:  Procedure Laterality Date   CORONARY ANGIOPLASTY WITH STENT PLACEMENT     ESOPHAGOGASTRODUODENOSCOPY (EGD) WITH PROPOFOL N/A 03/01/2021   Procedure: ESOPHAGOGASTRODUODENOSCOPY (EGD) WITH PROPOFOL;  Surgeon: VaLin LandsmanMD;  [place] Service:  Endoscopy;  Laterality: N/A;    Prior to Admission medications   Medication Sig Start Date End Date Taking? Authorizing Provider  albuterol (VENTOLIN HFA) 108 (90 Base) MCG/ACT inhaler Inhale 2 puffs into the lungs every 4 (four) hours as needed for wheezing or shortness of breath. 12/09/20   Paulette Blanch, MD  aspirin 81 MG chewable tablet Chew 1 tablet (81 mg total) by mouth daily. 06/07/20   Fritzi Mandes, MD  busPIRone (BUSPAR) 5 MG tablet Take 5 mg by mouth 2 (two) times daily. 02/09/21   [provider]  carvedilol  (COREG) 6.25 MG tablet Take 6.25 mg by mouth 2 (two) times daily with a meal.    [provider]  gabapentin (NEURONTIN) 300 MG capsule Take 600 mg by mouth at bedtime. 11/29/20   [provider]  lisinopril (ZESTRIL) 5 MG tablet  12/31/20   [provider]  Multiple Vitamin (MULTIVITAMIN WITH MINERALS) TABS tablet Take 1 tablet by mouth daily. 06/07/20   Fritzi Mandes, MD  nitroGLYCERIN (NITROSTAT) 0.4 MG SL tablet Place 1 tablet (0.4 mg total) under the tongue every 5 (five) minutes as needed for chest pain. 06/06/20   Fritzi Mandes, MD  Omega-3 Fatty Acids (FISH OIL) 1000 MG CAPS Take 2 capsules by mouth daily.    [provider]  pantoprazole (PROTONIX) 40 MG tablet  05/18/21   [provider]  rosuvastatin (CRESTOR) 40 MG tablet Take 40 mg by mouth daily.    [provider]  sertraline (ZOLOFT) 50 MG tablet Take by mouth. 02/09/21   [provider]    Allergies Penicillins and Other  Family History  Problem Relation Age of Onset   Coronary artery disease Mother        alive   Other Other        2 sisters alive and well   Alcohol abuse Father        died @ 55   Other Paternal Grandmother        PACER    Social History Social History   Tobacco Use   Smoking status: Every Day    Packs/day: 0.50    Years: 41.00    Pack years: 20.50    Types: Cigarettes   Smokeless tobacco: Never   Tobacco comments:    started age 37  Vaping Use   Vaping Use: Never used  Substance Use Topics   Alcohol use: Yes    Comment: at least a 6 pack of beer daily and often a 12 pack   Drug use: Not Currently    Types: Cocaine, Marijuana    Comment: (02/22/21 - pt reports none last 6 mos) cocaine used several weeks ago; marijuana today      Review of Systems Constitutional: No fever/chills Eyes: No visual changes. ENT: No sore throat. Cardiovascular: Positive chest pain Respiratory: Positive shortness of breath Gastrointestinal: Abdominal  pain, nausea, vomiting Genitourinary: Negative for dysuria. Musculoskeletal: Negative for back pain. Skin: Negative for rash. Neurological: Negative for headaches, focal weakness or numbness. All other ROS negative ____________________________________________   PHYSICAL EXAM:  VITAL SIGNS: ED Triage Vitals [08/07/21 1618]  Enc Vitals Group     BP (!) 86/63     Pulse Rate 91     Resp 20     Temp 98.6 F (37 C)     Temp Source Oral     SpO2 98 %     Weight      Height  Head Circumference      Peak Flow      Pain Score      Pain Loc      Pain Edu?      Excl. in [place]?     Constitutional: Alert and oriented. Well appearing and in no acute distress. Eyes: Conjunctivae are normal. EOMI. Head: Atraumatic. Nose: No congestion/rhinnorhea. Mouth/Throat: Mucous membranes are moist.   Neck: No stridor. Trachea Midline. FROM Cardiovascular: Normal rate, regular rhythm. Grossly normal heart sounds.  Good peripheral circulation. Respiratory: Normal respiratory effort.  No retractions. Lungs CTAB. Gastrointestinal: Tender in the upper abdomen without any rebound or guarding. No distention. No abdominal bruits.  Musculoskeletal: No lower extremity tenderness nor edema.  No joint effusions. Neurologic:  Normal speech and language. No gross focal neurologic deficits are appreciated.  Skin:  Skin is warm, dry and intact. No rash noted. Psychiatric: Mood and affect are normal. Speech and behavior are normal. GU: Deferred   ____________________________________________   LABS (all labs ordered are listed, but only abnormal results are displayed)  Labs Reviewed  LIPASE, BLOOD  COMPREHENSIVE METABOLIC PANEL  CBC  URINALYSIS, COMPLETE (UACMP) WITH MICROSCOPIC   ____________________________________________   ED ECG REPORT I, Vanessa [place], the attending physician, personally viewed and interpreted this ECG.  Sinus tachycardia rate of 100, no ST elevation, T wave versions in V4  through V6 to 3 and aVF, normal intervals ____________________________________________  RADIOLOGY   Official radiology report(s): DG Chest 2 View  Result Date: 08/07/2021 CLINICAL DATA:  Upper abdominal pain for 1 month, nausea vomiting, diaphoresis, short of breath EXAM: CHEST - 2 VIEW COMPARISON:  01/09/2021 FINDINGS: Frontal and lateral views of the chest demonstrate an unremarkable cardiac silhouette. No airspace disease, effusion, or pneumothorax. No acute bony abnormalities. IMPRESSION: 1. No acute intrathoracic process. Electronically Signed   By: Randa Ngo M.D.   On: 08/07/2021 18:04   CT Angio Chest PE W and/or Wo Contrast  Result Date: 08/07/2021 CLINICAL DATA:  Persistent upper abdominal pain for 1 month, nausea and vomiting, diaphoresis, pain with inspiration, back pain EXAM: CT ANGIOGRAPHY CHEST CT ABDOMEN AND PELVIS WITH CONTRAST TECHNIQUE: Multidetector CT imaging of the chest was performed using the standard protocol during bolus administration of intravenous contrast. Multiplanar CT image reconstructions and MIPs were obtained to evaluate the vascular anatomy. Multidetector CT imaging of the abdomen and pelvis was performed using the standard protocol during bolus administration of intravenous contrast. CONTRAST:  112m OMNIPAQUE IOHEXOL 350 MG/ML SOLN COMPARISON:  01/10/2021 FINDINGS: CTA CHEST FINDINGS Cardiovascular: There is technically adequate opacification of the pulmonary vasculature. No filling defects or pulmonary emboli. No evidence of thoracic aortic aneurysm or dissection. Mild atherosclerosis of the aortic arch. The heart is not enlarged. No pericardial effusion. Mild atherosclerosis of the coronary vasculature. Mediastinum/Nodes: No enlarged mediastinal, hilar, or axillary lymph nodes. Thyroid gland, trachea, and esophagus demonstrate no significant findings. Lungs/Pleura: No acute airspace disease, effusion, or pneumothorax. Stable minimal subpleural scarring at the  lung bases. Central airways are patent. Musculoskeletal: No acute or destructive bony lesions. Reconstructed images demonstrate no additional findings. Review of the MIP images confirms the above findings. CT ABDOMEN and PELVIS FINDINGS Hepatobiliary: Liver is enlarged, with heterogeneous fatty infiltration again noted. No focal liver abnormality. The gallbladder is unremarkable. No biliary duct dilation. Pancreas: Unremarkable. No pancreatic ductal dilatation or surrounding inflammatory changes. Spleen: Spleen is mildly enlarged measuring 13.8 x 12.0 x 5.9 cm. No focal parenchymal abnormality. Adrenals/Urinary Tract: Adrenal glands are unremarkable. Kidneys  are normal, without renal calculi, focal lesion, or hydronephrosis. Bladder is unremarkable. Stomach/Bowel: No bowel obstruction or ileus. No bowel wall thickening or inflammatory change. Vascular/Lymphatic: Aortic atherosclerosis. No enlarged abdominal or pelvic lymph nodes. Reproductive: Prostate is unremarkable. Other: No free fluid or free gas.  No abdominal wall hernia. Musculoskeletal: No acute or destructive bony lesions. Reconstructed images demonstrate no additional findings. Review of the MIP images confirms the above findings. IMPRESSION: 1. No acute intrathoracic process. 2. No evidence of pulmonary embolus. 3. No evidence of aortic aneurysm or dissection. 4. Hepatic steatosis. 5. Borderline splenomegaly. 6.  Aortic Atherosclerosis (ICD10-I70.0). Electronically Signed   By: Randa Ngo M.D.   On: 08/07/2021 19:36   CT ABDOMEN PELVIS W CONTRAST  Result Date: 08/07/2021 CLINICAL DATA:  Persistent upper abdominal pain for 1 month, nausea and vomiting, diaphoresis, pain with inspiration, back pain EXAM: CT ANGIOGRAPHY CHEST CT ABDOMEN AND PELVIS WITH CONTRAST TECHNIQUE: Multidetector CT imaging of the chest was performed using the standard protocol during bolus administration of intravenous contrast. Multiplanar CT image reconstructions and MIPs  were obtained to evaluate the vascular anatomy. Multidetector CT imaging of the abdomen and pelvis was performed using the standard protocol during bolus administration of intravenous contrast. CONTRAST:  12m OMNIPAQUE IOHEXOL 350 MG/ML SOLN COMPARISON:  01/10/2021 FINDINGS: CTA CHEST FINDINGS Cardiovascular: There is technically adequate opacification of the pulmonary vasculature. No filling defects or pulmonary emboli. No evidence of thoracic aortic aneurysm or dissection. Mild atherosclerosis of the aortic arch. The heart is not enlarged. No pericardial effusion. Mild atherosclerosis of the coronary vasculature. Mediastinum/Nodes: No enlarged mediastinal, hilar, or axillary lymph nodes. Thyroid gland, trachea, and esophagus demonstrate no significant findings. Lungs/Pleura: No acute airspace disease, effusion, or pneumothorax. Stable minimal subpleural scarring at the lung bases. Central airways are patent. Musculoskeletal: No acute or destructive bony lesions. Reconstructed images demonstrate no additional findings. Review of the MIP images confirms the above findings. CT ABDOMEN and PELVIS FINDINGS Hepatobiliary: Liver is enlarged, with heterogeneous fatty infiltration again noted. No focal liver abnormality. The gallbladder is unremarkable. No biliary duct dilation. Pancreas: Unremarkable. No pancreatic ductal dilatation or surrounding inflammatory changes. Spleen: Spleen is mildly enlarged measuring 13.8 x 12.0 x 5.9 cm. No focal parenchymal abnormality. Adrenals/Urinary Tract: Adrenal glands are unremarkable. Kidneys are normal, without renal calculi, focal lesion, or hydronephrosis. Bladder is unremarkable. Stomach/Bowel: No bowel obstruction or ileus. No bowel wall thickening or inflammatory change. Vascular/Lymphatic: Aortic atherosclerosis. No enlarged abdominal or pelvic lymph nodes. Reproductive: Prostate is unremarkable. Other: No free fluid or free gas.  No abdominal wall hernia. Musculoskeletal:  No acute or destructive bony lesions. Reconstructed images demonstrate no additional findings. Review of the MIP images confirms the above findings. IMPRESSION: 1. No acute intrathoracic process. 2. No evidence of pulmonary embolus. 3. No evidence of aortic aneurysm or dissection. 4. Hepatic steatosis. 5. Borderline splenomegaly. 6.  Aortic Atherosclerosis (ICD10-I70.0). Electronically Signed   By: MRanda NgoM.D.   On: 08/07/2021 19:36    ____________________________________________   PROCEDURES  Procedure(s) performed (including Critical Care):  .1-3 Lead EKG Interpretation  Date/Time: 08/07/2021 4:51 PM Performed by: FVanessa Hickory MD Authorized by: FVanessa  MD     Interpretation: normal     ECG rate:  80   ECG rate assessment: normal     Rhythm: sinus rhythm     Ectopy: none     Conduction: normal     ____________________________________________   INITIAL IMPRESSION / ASSESSMENT AND PLAN / ED COURSE  BARRE ACORS was evaluated in Emergency Department on 08/07/2021 for the symptoms described in the history of present illness. He was evaluated in the context of the global COVID-19 pandemic, which necessitated consideration that the patient might be at risk for infection with the SARS-CoV-2 virus that causes COVID-19. Institutional protocols and algorithms that pertain to the evaluation of patients at risk for COVID-19 are in a state of rapid change based on information released by regulatory bodies including the CDC and federal and state organizations. These policies and algorithms were followed during the patient's care in the ED.    Patient comes in with abdominal pain, shortness of breath and pleuritic chest pain.  Unable to [facility] out due to heart rate of the 100 on EKG and patient does have new T wave inversions.  Will get troponin and D-dimer.  We will set up with ultrasound given the upper abdominal pain to make sure no evidence of cholecystitis.  Will get labs to  evaluate for Electra abnormalities, AKI.  We will give 1 dose of IV Dilaudid, IV Zofran, IV fluids while pending these results.   D-dimer was elevated so I did proceed with CT imaging.  CT imaging was only positive for hepatic steatosis otherwise reassuring  His labs were concerning with new T bili elevation to 3.9.  He has never had this previously.  This could be an alcoholic hepatitis.  His INR is normal.  Denies Tylenol but will get a Tylenol level to ensure.  His potassium is low at 2.4 and he is getting both oral and IV repletion.  His pain was still continued after the IV Dilaudid so we will give another dose of IV Dilaudid.  Patient still pending ultrasound the patient will need to be admitted for further work-up of this  [place] negative  Prior US with no gallstones so unlikely choledoco. Maybe alcoholic hepatitis? Pain could be from gastritis.  Rectal exam brown stool heme occult negative.  Will admit to hospital     ____________________________________________   FINAL CLINICAL IMPRESSION(S) / ED DIAGNOSES   Final diagnoses:  Epigastric abdominal pain  Serum total bilirubin elevated  Non-intractable vomiting with nausea, unspecified vomiting type      MEDICATIONS GIVEN DURING THIS VISIT:  Medications  HYDROmorphone (DILAUDID) injection 1 mg (has no administration in time range)  sodium chloride 0.9 % bolus 1,000 mL (1,000 mLs Intravenous New Bag/Given 08/07/21 1711)  HYDROmorphone (DILAUDID) injection 0.5 mg (0.5 mg Intravenous Given 08/07/21 1713)  ondansetron (ZOFRAN) injection 4 mg (4 mg Intravenous Given 08/07/21 1703)  pantoprazole (PROTONIX) injection 40 mg (40 mg Intravenous Given 08/07/21 1711)  0.9 % NaCl with KCl 20 mEq/ L  infusion ( Intravenous New Bag/Given 08/07/21 1739)  potassium chloride SA (KLOR-CON) CR tablet 40 mEq (40 mEq Oral Given 08/07/21 1739)  iohexol (OMNIPAQUE) 350 MG/ML injection 100 mL (100 mLs Intravenous Contrast Given 08/07/21 1913)     ED  Discharge Orders     None        Note:  This document was prepared using Dragon voice recognition software and may include unintentional dictation errors.    Vanessa [place], MD 08/08/21 0130

## 2021-08-07 NOTE — ED Notes (Signed)
Dr Jari Pigg at bedside.  Pt states has had constant upper abdominal pain for about past 1 month. States has had N/V and diaphoresis. Stopped smoking tobacco and drinking alcohol about 3-4 weeks ago. Thinks abdomen is larger than before. States "it hurts to breathe" and also has back pain. Denies chest pain. Denies falls or trauma.

## 2021-08-07 NOTE — ED Triage Notes (Signed)
Pt comes into the ED via EMS from home with c/o epigastric pain with N/V for the past 3 weeks.  #[id number], 593m NS given

## 2021-08-07 NOTE — H&P (Signed)
History and Physical    Ethan Wells MVV:[id number] DOB: 10/04/1971 DOA: 08/07/2021  PCP: [facility]   Patient coming from: home  I have personally briefly reviewed patient's old medical records in [place]  Chief Complaint: Nausea vomiting and abdominal pain  HPI: Ethan Wells is a 50 y.o. male with medical history significant for CAD with history of STEMI complicated by V. fib arrest during cardiac cath, systolic heart failure, alcohol abuse and polysubstance abuse, who presents to the ED with a 3-week history of nausea with vomiting, decreased oral intake and epigastric pain. Abdominal pain is sharp and of severe intensity, nonradiating.  No associated palpitations or lightheadedness or diaphoresis.  Emesis is nonbloody nonbilious and without coffee grounds.  He denies blood in stool or black stool. He denies chest pain, shortness of breath or cough and denies fever or chills.  Denies diarrhea or dysuria .Patient had a similar episode of intractable vomiting back in March 2022 and underwent EGD with pathology showing reactive gastropathy and Barrett's esophagus.    ED course: On arrival patient was initially hypotensive at 86/63 with pulse of 91 and otherwise normal vitals, with BP improving to 122/78 with fluid resuscitation. Blood work significant for WBC 12,000, hemoglobin 7.6, down from 15.2 about 4 months prior.  Lipase 68, total bilirubin 3.9, alk phos 209, AST/ALT 62/20.  Sodium 128 and potassium 2.4.  Troponin 6, EtOH less than 10 and UDS positive for cannabinoids.  Acetaminophen level less than 10.  D-dimer 0.56 COVID and flu negative Stool guaiac-Negative  EKG, personally viewed and interpreted: Sinus at 100 with nonspecific ST-T wave changes  Imaging: Patient underwent extensive imaging to include CT abdomen and pelvis with contrast, CT angio chest as well as right upper quadrant sonogram.  Findings were significant only for hepatic  steatosis  Patient treated with antiemetics, pain meds and IV PPI.  Hospitalist consulted for admission  Review of Systems: As per HPI otherwise all other systems on review of systems negative.    Past Medical History:  Diagnosis Date   CAD (coronary artery disease)    a. 01/2011 Anterior STEMI/Cath/PCI: LM nl, LAD 100d (3.5x38m Vision BMS placed), LCX 271mRI 50, RCA min irregs, EF 40% w/ apical, inferoapical HK.   Cardiac arrest - ventricular fibrillation    a. In setting of STEMI 01/2011   Chronic pain    Cocaine abuse ([place]   Depression with anxiety    ETOH abuse    a. 6-12 beers/day   GERD (gastroesophageal reflux disease)    Hemorrhoids    Hyperlipidemia    Hypertension    Ischemic cardiomyopathy    a. 06/2011 Echo: EF 45-50%, No rwma   Marijuana abuse    Migraines    Tobacco abuse    a. 1/2 ppd x 26 yrs    Past Surgical History:  Procedure Laterality Date   CORONARY ANGIOPLASTY WITH STENT PLACEMENT     ESOPHAGOGASTRODUODENOSCOPY (EGD) WITH PROPOFOL N/A 03/01/2021   Procedure: ESOPHAGOGASTRODUODENOSCOPY (EGD) WITH PROPOFOL;  Surgeon: VaLin LandsmanMD;  [place] [facility];  Laterality: N/A;     reports that he has been smoking cigarettes. He has a 20.50 pack-year smoking history. He has never used smokeless tobacco. He reports current alcohol use. He reports that he does not currently use drugs after having used the following drugs: Cocaine and Marijuana.  Allergies  Allergen Reactions   Penicillins Hives   Other Rash  chives    Family History  Problem Relation Age of Onset   Coronary artery disease Mother        alive   Other Other        2 sisters alive and well   Alcohol abuse Father        died @ 68   Other Paternal Grandmother        PACER      Prior to Admission medications   Medication Sig Start Date End Date Taking? Authorizing Provider  aspirin 81 MG chewable tablet Chew 1 tablet (81 mg total) by mouth  daily. 06/07/20  Yes Fritzi Mandes, MD  gabapentin (NEURONTIN) 300 MG capsule Take 900 mg by mouth at bedtime.   Yes [provider]  hydrOXYzine (ATARAX/VISTARIL) 25 MG tablet Take 25-50 mg by mouth See admin instructions. Take 1 tablet (52m) by mouth 3 times daily as needed for anxiety and take 2 tablets (528m by mouth at bedtime as needed for sleep or anxiety   Yes [provider]  lisinopril (ZESTRIL) 5 MG tablet Take 5 mg by mouth daily.   Yes [provider]  Omega-3 Fatty Acids (FISH OIL) 1000 MG CAPS Take 2 capsules by mouth daily.   Yes [provider]  pantoprazole (PROTONIX) 40 MG tablet Take 40 mg by mouth daily.   Yes [provider]  rosuvastatin (CRESTOR) 40 MG tablet Take 40 mg by mouth daily.   Yes [provider]  albuterol (VENTOLIN HFA) 108 (90 Base) MCG/ACT inhaler Inhale 2 puffs into the lungs every 4 (four) hours as needed for wheezing or shortness of breath. Patient not taking: Reported on 08/07/2021 12/09/20   SuPaulette BlanchMD  Multiple Vitamin (MULTIVITAMIN WITH MINERALS) TABS tablet Take 1 tablet by mouth daily. Patient not taking: Reported on 08/07/2021 06/07/20   PaFritzi MandesMD  nitroGLYCERIN (NITROSTAT) 0.4 MG SL tablet Place 1 tablet (0.4 mg total) under the tongue every 5 (five) minutes as needed for chest pain. Patient not taking: Reported on 08/07/2021 06/06/20   PaFritzi MandesMD    Physical Exam: Vitals:   08/07/21 1618 08/07/21 1629 08/07/21 1830  BP: (!) 86/63 107/68 110/70  Pulse: 91 86 91  Resp: _0 Temp: 98.6 F (37 C)    TempSrc: Oral    SpO2: 98% 98% 100%     Vitals:   08/07/21 1618 08/07/21 1629 08/07/21 1830  BP: (!) 86/63 107/68 110/70  Pulse: 91 86 91  Resp: _1 Temp: 98.6 F (37 C)    TempSrc: Oral    SpO2: 98% 98% 100%      Constitutional: Alert and oriented x 3 .  Appears to be in pain  HEENT:      Head: Normocephalic and atraumatic.         Eyes: PERLA, EOMI,  Conjunctivae are normal. Sclera is non-icteric.       Mouth/Throat: Mucous membranes are moist.       Neck: Supple with no signs of meningismus. Cardiovascular: Regular rate and rhythm. No murmurs, gallops, or rubs. 2+ symmetrical distal pulses are present . No JVD. No LE edema Respiratory: Respiratory effort normal .Lungs sounds clear bilaterally. No wheezes, crackles, or rhonchi.  Gastrointestinal: Soft, tender in epigastrium, and non distended with positive bowel sounds.  Genitourinary: No CVA tenderness. Musculoskeletal: Nontender with normal range of motion in all extremities. No cyanosis, or erythema of extremities. Neurologic:  Face is symmetric. Moving all extremities.  No gross focal neurologic deficits . Skin: Skin is warm, dry.  No rash or ulcers Psychiatric: Mood and affect are normal    Labs on Admission: I have personally reviewed following labs and imaging studies  CBC: Recent Labs  Lab 08/07/21 1612  WBC 11.8*  HGB 7.6*  HCT 21.9*  MCV 89.8  PLT 093   Basic Metabolic Panel: Recent Labs  Lab 08/07/21 1612  NA 128*  K 2.4*  CL 96*  CO2 21*  GLUCOSE 145*  BUN 9  CREATININE 0.69  CALCIUM 8.0*  MG 1.7   GFR: CrCl cannot be calculated (Unknown ideal weight.). Liver Function Tests: Recent Labs  Lab 08/07/21 1612  AST 62*  ALT 20  ALKPHOS 209*  BILITOT 3.9*  PROT 7.6  ALBUMIN 2.6*   Recent Labs  Lab 08/07/21 1612  LIPASE 68*   No results for input(s): AMMONIA in the last 168 hours. Coagulation Profile: Recent Labs  Lab 08/07/21 1715  INR 1.1   Cardiac Enzymes: No results for input(s): CKTOTAL, CKMB, CKMBINDEX, TROPONINI in the last 168 hours. BNP (last 3 results) No results for input(s): PROBNP in the last 8760 hours. HbA1C: No results for input(s): HGBA1C in the last 72 hours. CBG: No results for input(s): GLUCAP in the last 168 hours. Lipid Profile: No results for input(s): CHOL, HDL, LDLCALC, TRIG, CHOLHDL, LDLDIRECT in the last 72  hours. Thyroid Function Tests: No results for input(s): TSH, T4TOTAL, FREET4, T3FREE, THYROIDAB in the last 72 hours. Anemia Panel: No results for input(s): VITAMINB12, FOLATE, FERRITIN, TIBC, IRON, RETICCTPCT in the last 72 hours. Urine analysis:    Component Value Date/Time   COLORURINE AMBER (A) 08/07/2021 1612   APPEARANCEUR CLEAR (A) 08/07/2021 1612   APPEARANCEUR Clear 03/16/2012 1315   LABSPEC 1.009 08/07/2021 1612   LABSPEC 1.013 03/16/2012 1315   PHURINE 8.0 08/07/2021 1612   GLUCOSEU NEGATIVE 08/07/2021 1612   GLUCOSEU 50 mg/dL 03/16/2012 1315   HGBUR NEGATIVE 08/07/2021 1612   BILIRUBINUR NEGATIVE 08/07/2021 1612   BILIRUBINUR Negative 03/16/2012 1315   KETONESUR NEGATIVE 08/07/2021 1612   PROTEINUR NEGATIVE 08/07/2021 1612   NITRITE NEGATIVE 08/07/2021 1612   LEUKOCYTESUR NEGATIVE 08/07/2021 1612   LEUKOCYTESUR Negative 03/16/2012 1315    Radiological Exams on Admission: DG Chest 2 View  Result Date: 08/07/2021 CLINICAL DATA:  Upper abdominal pain for 1 month, nausea vomiting, diaphoresis, short of breath EXAM: CHEST - 2 VIEW COMPARISON:  01/09/2021 FINDINGS: Frontal and lateral views of the chest demonstrate an unremarkable cardiac silhouette. No airspace disease, effusion, or pneumothorax. No acute bony abnormalities. IMPRESSION: 1. No acute intrathoracic process. Electronically Signed   By: Randa Ngo M.D.   On: 08/07/2021 18:04   CT Angio Chest PE W and/or Wo Contrast  Result Date: 08/07/2021 CLINICAL DATA:  Persistent upper abdominal pain for 1 month, nausea and vomiting, diaphoresis, pain with inspiration, back pain EXAM: CT ANGIOGRAPHY CHEST CT ABDOMEN AND PELVIS WITH CONTRAST TECHNIQUE: Multidetector CT imaging of the chest was performed using the standard protocol during bolus administration of intravenous contrast. Multiplanar CT image reconstructions and MIPs were obtained to evaluate the vascular anatomy. Multidetector CT imaging of the abdomen and pelvis  was performed using the standard protocol during bolus administration of intravenous contrast. CONTRAST:  175m OMNIPAQUE IOHEXOL 350 MG/ML SOLN COMPARISON:  01/10/2021 FINDINGS: CTA CHEST FINDINGS Cardiovascular: There is technically adequate opacification of the pulmonary vasculature. No filling defects or pulmonary emboli. No evidence of thoracic aortic aneurysm or dissection. Mild atherosclerosis of  the aortic arch. The heart is not enlarged. No pericardial effusion. Mild atherosclerosis of the coronary vasculature. Mediastinum/Nodes: No enlarged mediastinal, hilar, or axillary lymph nodes. Thyroid gland, trachea, and esophagus demonstrate no significant findings. Lungs/Pleura: No acute airspace disease, effusion, or pneumothorax. Stable minimal subpleural scarring at the lung bases. Central airways are patent. Musculoskeletal: No acute or destructive bony lesions. Reconstructed images demonstrate no additional findings. Review of the MIP images confirms the above findings. CT ABDOMEN and PELVIS FINDINGS Hepatobiliary: Liver is enlarged, with heterogeneous fatty infiltration again noted. No focal liver abnormality. The gallbladder is unremarkable. No biliary duct dilation. Pancreas: Unremarkable. No pancreatic ductal dilatation or surrounding inflammatory changes. Spleen: Spleen is mildly enlarged measuring 13.8 x 12.0 x 5.9 cm. No focal parenchymal abnormality. Adrenals/Urinary Tract: Adrenal glands are unremarkable. Kidneys are normal, without renal calculi, focal lesion, or hydronephrosis. Bladder is unremarkable. Stomach/Bowel: No bowel obstruction or ileus. No bowel wall thickening or inflammatory change. Vascular/Lymphatic: Aortic atherosclerosis. No enlarged abdominal or pelvic lymph nodes. Reproductive: Prostate is unremarkable. Other: No free fluid or free gas.  No abdominal wall hernia. Musculoskeletal: No acute or destructive bony lesions. Reconstructed images demonstrate no additional findings.  Review of the MIP images confirms the above findings. IMPRESSION: 1. No acute intrathoracic process. 2. No evidence of pulmonary embolus. 3. No evidence of aortic aneurysm or dissection. 4. Hepatic steatosis. 5. Borderline splenomegaly. 6.  Aortic Atherosclerosis (ICD10-I70.0). Electronically Signed   By: Randa Ngo M.D.   On: 08/07/2021 19:36   CT ABDOMEN PELVIS W CONTRAST  Result Date: 08/07/2021 CLINICAL DATA:  Persistent upper abdominal pain for 1 month, nausea and vomiting, diaphoresis, pain with inspiration, back pain EXAM: CT ANGIOGRAPHY CHEST CT ABDOMEN AND PELVIS WITH CONTRAST TECHNIQUE: Multidetector CT imaging of the chest was performed using the standard protocol during bolus administration of intravenous contrast. Multiplanar CT image reconstructions and MIPs were obtained to evaluate the vascular anatomy. Multidetector CT imaging of the abdomen and pelvis was performed using the standard protocol during bolus administration of intravenous contrast. CONTRAST:  151m OMNIPAQUE IOHEXOL 350 MG/ML SOLN COMPARISON:  01/10/2021 FINDINGS: CTA CHEST FINDINGS Cardiovascular: There is technically adequate opacification of the pulmonary vasculature. No filling defects or pulmonary emboli. No evidence of thoracic aortic aneurysm or dissection. Mild atherosclerosis of the aortic arch. The heart is not enlarged. No pericardial effusion. Mild atherosclerosis of the coronary vasculature. Mediastinum/Nodes: No enlarged mediastinal, hilar, or axillary lymph nodes. Thyroid gland, trachea, and esophagus demonstrate no significant findings. Lungs/Pleura: No acute airspace disease, effusion, or pneumothorax. Stable minimal subpleural scarring at the lung bases. Central airways are patent. Musculoskeletal: No acute or destructive bony lesions. Reconstructed images demonstrate no additional findings. Review of the MIP images confirms the above findings. CT ABDOMEN and PELVIS FINDINGS Hepatobiliary: Liver is enlarged,  with heterogeneous fatty infiltration again noted. No focal liver abnormality. The gallbladder is unremarkable. No biliary duct dilation. Pancreas: Unremarkable. No pancreatic ductal dilatation or surrounding inflammatory changes. Spleen: Spleen is mildly enlarged measuring 13.8 x 12.0 x 5.9 cm. No focal parenchymal abnormality. Adrenals/Urinary Tract: Adrenal glands are unremarkable. Kidneys are normal, without renal calculi, focal lesion, or hydronephrosis. Bladder is unremarkable. Stomach/Bowel: No bowel obstruction or ileus. No bowel wall thickening or inflammatory change. Vascular/Lymphatic: Aortic atherosclerosis. No enlarged abdominal or pelvic lymph nodes. Reproductive: Prostate is unremarkable. Other: No free fluid or free gas.  No abdominal wall hernia. Musculoskeletal: No acute or destructive bony lesions. Reconstructed images demonstrate no additional findings. Review of the MIP images confirms the above  findings. IMPRESSION: 1. No acute intrathoracic process. 2. No evidence of pulmonary embolus. 3. No evidence of aortic aneurysm or dissection. 4. Hepatic steatosis. 5. Borderline splenomegaly. 6.  Aortic Atherosclerosis (ICD10-I70.0). Electronically Signed   By: Randa Ngo M.D.   On: 08/07/2021 19:36   US ABDOMEN LIMITED RUQ (LIVER/GB)  Result Date: 08/07/2021 CLINICAL DATA:  Abdominal pain for 1 month, nausea and vomiting, diaphoresis, epigastric pain EXAM: ULTRASOUND ABDOMEN LIMITED RIGHT UPPER QUADRANT COMPARISON:  08/07/2021 FINDINGS: Gallbladder: No gallstones or wall thickening visualized. No sonographic Murphy sign noted by sonographer. Common bile duct: Diameter: 2 mm Liver: There is diffuse increased liver echotexture consistent with hepatic steatosis seen on preceding CT. Evaluation is limited by body habitus. Portal vein is patent on color Doppler imaging with normal direction of blood flow towards the liver. Other: None. IMPRESSION: 1. Limited study due to body habitus. 2. Hepatic  steatosis. Electronically Signed   By: Randa Ngo M.D.   On: 08/07/2021 20:21     Assessment/Plan 50 year old male with history of CAD with history of STEMI complicated by V. fib arrest during cardiac cath, systolic heart failure, alcohol abuse and polysubstance abuse, who presents to the ED with a 3-week history of nausea with vomiting, decreased oral intake and epigastric pain.     Acute gastritis - Patient presents with intractable vomiting, nonbloody nonbilious and without coffee-ground - EGD with biopsy in March 2022 showed reactive gastropathy and Barrett's esophagus. - CT abdomen with contrast with no acute findings - N.p.o. except for ice chips - IV Protonix, IV pain meds - IV fluids - GI consult  Hypokalemia - Secondary to intractable vomiting - IV repletion and oral when tolerating    Alcohol use disorder, moderate, dependence (HCC) - History of severe alcohol use, until 3 weeks prior when symptoms began - CIWA withdrawal protocol - For counseling on abstinence when improved   Hyperbilirubinemia - Bili of 3.9 with alk phos 209.  AST/ALT 62/20.  Lipase 68 - Right upper quadrant sonogram showing hepatic steatosis  Suspect acute vs subacute blood loss anemia no GI etiology - Hemoglobin 7.6, down from 15.2 a few months prior - Patient denies hematemesis, black or bloody stool - Stool for occult blood in ED negative - Serial H&H - Transfuse if hemoglobin falls below 7    CAD (coronary artery disease)   History of ST elevation myocardial infarction (STEMI) - Troponin negative, patient denies chest pain and EKG nonacute - Continue to monitor - Continue rosuvastatin if tolerating orally.  Holding aspirin enteric-coated only.  Not on beta-blocker - NTG sublingual as needed.   DVT prophylaxis: Lovenox  Code Status: full code  Family Communication:  none  Disposition Plan: Back to previous home environment Consults called: GI  Status:At the time of admission, it  appears that the appropriate admission status for this patient is INPATIENT. This is judged to be reasonable and necessary in order to provide the required intensity of service to ensure the patient's safety given the presenting symptoms, physical exam findings, and initial radiographic and laboratory data in the context of their  Comorbid conditions.   Patient requires inpatient status due to high intensity of service, high risk for further deterioration and high frequency of surveillance required.   I certify that at the point of admission it is my clinical judgment that the patient will require inpatient hospital care spanning beyond [place] MD [facility]     08/07/2021, 9:28 PM

## 2021-08-07 NOTE — ED Notes (Signed)
Pt is relaxed in the bed on the monitor at this time. Call bell is within reach.

## 2021-08-08 ENCOUNTER — Inpatient Hospital Stay: Payer: Self-pay

## 2021-08-08 DIAGNOSIS — R1013 Epigastric pain: Secondary | ICD-10-CM | POA: Diagnosis not present

## 2021-08-08 DIAGNOSIS — E876 Hypokalemia: Secondary | ICD-10-CM

## 2021-08-08 DIAGNOSIS — D62 Acute posthemorrhagic anemia: Secondary | ICD-10-CM | POA: Diagnosis not present

## 2021-08-08 DIAGNOSIS — I2510 Atherosclerotic heart disease of native coronary artery without angina pectoris: Secondary | ICD-10-CM

## 2021-08-08 DIAGNOSIS — I252 Old myocardial infarction: Secondary | ICD-10-CM

## 2021-08-08 DIAGNOSIS — F1020 Alcohol dependence, uncomplicated: Secondary | ICD-10-CM

## 2021-08-08 LAB — TYPE AND SCREEN
ABO/RH(D): A POS
Antibody Screen: NEGATIVE
Unit division: 0

## 2021-08-08 LAB — GLUCOSE, CAPILLARY
Glucose-Capillary: 108 mg/dL — ABNORMAL HIGH (ref 70–99)
Glucose-Capillary: 108 mg/dL — ABNORMAL HIGH (ref 70–99)
Glucose-Capillary: 141 mg/dL — ABNORMAL HIGH (ref 70–99)

## 2021-08-08 LAB — IRON AND TIBC
Iron: 249 ug/dL — ABNORMAL HIGH (ref 45–182)
Saturation Ratios: 89 % — ABNORMAL HIGH (ref 17.9–39.5)
TIBC: 281 ug/dL (ref 250–450)
UIBC: 32 ug/dL

## 2021-08-08 LAB — COMPREHENSIVE METABOLIC PANEL
ALT: 17 U/L (ref 0–44)
AST: 57 U/L — ABNORMAL HIGH (ref 15–41)
Albumin: 2.7 g/dL — ABNORMAL LOW (ref 3.5–5.0)
Alkaline Phosphatase: 191 U/L — ABNORMAL HIGH (ref 38–126)
Anion gap: 11 (ref 5–15)
BUN: 7 mg/dL (ref 6–20)
CO2: 20 mmol/L — ABNORMAL LOW (ref 22–32)
Calcium: 8.1 mg/dL — ABNORMAL LOW (ref 8.9–10.3)
Chloride: 103 mmol/L (ref 98–111)
Creatinine, Ser: 0.54 mg/dL — ABNORMAL LOW (ref 0.61–1.24)
GFR, Estimated: >60 mL/min (ref >60)
Glucose, Bld: 95 mg/dL (ref 70–99)
Potassium: 2.8 mmol/L — ABNORMAL LOW (ref 3.5–5.1)
Sodium: 134 mmol/L — ABNORMAL LOW (ref 135–145)
Total Bilirubin: 4.1 mg/dL — ABNORMAL HIGH (ref 0.3–1.2)
Total Protein: 7.4 g/dL (ref 6.5–8.1)

## 2021-08-08 LAB — BPAM RBC
Blood Product Expiration Date: 202209212359
ISSUE DATE / TIME: 202208232358
Unit Type and Rh: 6200

## 2021-08-08 LAB — VITAMIN B12: Vitamin B-12: 738 pg/mL (ref 180–914)

## 2021-08-08 LAB — HEPATITIS C ANTIBODY: HCV Ab: NONREACTIVE

## 2021-08-08 LAB — HEMOGLOBIN AND HEMATOCRIT, BLOOD
HCT: 25.1 % — ABNORMAL LOW (ref 39.0–52.0)
Hemoglobin: 8.6 g/dL — ABNORMAL LOW (ref 13.0–17.0)

## 2021-08-08 LAB — HEPATITIS A ANTIBODY, IGM: Hep A IgM: NONREACTIVE

## 2021-08-08 LAB — HEPATITIS B CORE ANTIBODY, TOTAL: Hep B Core Total Ab: NONREACTIVE

## 2021-08-08 LAB — FOLATE: Folate: 2.4 ng/mL — ABNORMAL LOW (ref >5.9)

## 2021-08-08 LAB — HEPATITIS B CORE ANTIBODY, IGM: Hep B C IgM: NONREACTIVE

## 2021-08-08 LAB — HEPATITIS B SURFACE ANTIGEN: Hepatitis B Surface Ag: NONREACTIVE

## 2021-08-08 MED ORDER — OXYCODONE HCL 5 MG PO TABS
5.0000 mg | ORAL_TABLET | ORAL | Status: DC | PRN
  Administered 2021-08-08: 5 mg via ORAL
  Filled 2021-08-08: qty 1

## 2021-08-08 MED ORDER — CHLORHEXIDINE GLUCONATE CLOTH 2 % EX PADS
6.0000 | MEDICATED_PAD | Freq: Every day | CUTANEOUS | Status: DC
  Administered 2021-08-08 – 2021-08-10 (×3): 6 via TOPICAL

## 2021-08-08 MED ORDER — SUCRALFATE 1 GM/10ML PO SUSP
1.0000 g | Freq: Three times a day (TID) | ORAL | Status: DC
  Administered 2021-08-08 – 2021-08-11 (×11): 1 g via ORAL
  Filled 2021-08-08 (×11): qty 10

## 2021-08-08 MED ORDER — POTASSIUM CHLORIDE IN NACL 40-0.9 MEQ/L-% IV SOLN
INTRAVENOUS | Status: DC
  Filled 2021-08-08 (×4): qty 1000

## 2021-08-08 MED ORDER — PEG 3350-KCL-NA BICARB-NACL 420 G PO SOLR
4000.0000 mL | Freq: Once | ORAL | Status: AC
  Administered 2021-08-08: 4000 mL via ORAL
  Filled 2021-08-08: qty 4000

## 2021-08-08 MED ORDER — DEXTROSE-NACL 5-0.45 % IV SOLN: INTRAVENOUS | Status: DC

## 2021-08-08 MED ORDER — HYDROMORPHONE HCL 1 MG/ML IJ SOLN
2.0000 mg | INTRAMUSCULAR | Status: DC | PRN
  Administered 2021-08-08 – 2021-08-09 (×5): 2 mg via INTRAVENOUS
  Filled 2021-08-08 (×6): qty 2

## 2021-08-08 MED ORDER — HYDROXYZINE HCL 25 MG PO TABS
25.0000 mg | ORAL_TABLET | Freq: Three times a day (TID) | ORAL | Status: DC | PRN
  Administered 2021-08-08 – 2021-08-09 (×2): 25 mg via ORAL
  Filled 2021-08-08 (×2): qty 1

## 2021-08-08 MED ORDER — POTASSIUM CHLORIDE 10 MEQ/100ML IV SOLN
10.0000 meq | INTRAVENOUS | Status: AC
  Administered 2021-08-08 (×6): 10 meq via INTRAVENOUS
  Filled 2021-08-08 (×3): qty 100

## 2021-08-08 MED ORDER — SODIUM CHLORIDE 0.9% FLUSH
10.0000 mL | Freq: Two times a day (BID) | INTRAVENOUS | Status: DC
  Administered 2021-08-08: 10 mL
  Administered 2021-08-08: 20 mL
  Administered 2021-08-09: 40 mL
  Administered 2021-08-09 – 2021-08-11 (×3): 10 mL

## 2021-08-08 MED ORDER — LORAZEPAM 1 MG PO TABS: 1.0000 mg | ORAL_TABLET | ORAL | Status: DC | PRN

## 2021-08-08 MED ORDER — DIPHENHYDRAMINE HCL 50 MG/ML IJ SOLN
25.0000 mg | Freq: Four times a day (QID) | INTRAMUSCULAR | Status: DC | PRN
  Filled 2021-08-08: qty 1

## 2021-08-08 MED ORDER — HYDROMORPHONE HCL 1 MG/ML IJ SOLN
1.0000 mg | INTRAMUSCULAR | Status: DC | PRN
  Administered 2021-08-08 (×4): 1 mg via INTRAVENOUS
  Filled 2021-08-08 (×5): qty 1

## 2021-08-08 MED ORDER — SODIUM CHLORIDE 0.9% FLUSH: 10.0000 mL | INTRAVENOUS | Status: DC | PRN

## 2021-08-08 MED ORDER — OXYCODONE HCL 5 MG PO TABS
10.0000 mg | ORAL_TABLET | ORAL | Status: DC | PRN
  Administered 2021-08-08 – 2021-08-10 (×6): 10 mg via ORAL
  Filled 2021-08-08 (×6): qty 2

## 2021-08-08 MED ORDER — ACETAMINOPHEN 500 MG PO TABS
500.0000 mg | ORAL_TABLET | Freq: Four times a day (QID) | ORAL | Status: DC
  Administered 2021-08-08 – 2021-08-11 (×12): 500 mg via ORAL
  Filled 2021-08-08 (×12): qty 1

## 2021-08-08 NOTE — Progress Notes (Signed)
Patient declined bowel prep

## 2021-08-08 NOTE — Progress Notes (Signed)
Peripherally Inserted Central Catheter Placement  The IV Nurse has discussed with the patient and/or persons authorized to consent for the patient, the purpose of this procedure and the potential benefits and risks involved with this procedure.  The benefits include less needle sticks, lab draws from the catheter, and the patient may be discharged home with the catheter. Risks include, but not limited to, infection, bleeding, blood clot (thrombus formation), and puncture of an artery; nerve damage and irregular heartbeat and possibility to perform a PICC exchange if needed/ordered by physician.  Alternatives to this procedure were also discussed.  Bard Power PICC patient education guide, fact sheet on infection prevention and patient information card has been provided to patient /or left at bedside.    PICC Placement Documentation  PICC Double Lumen [id number] PICC Right Basilic 40 cm 0 cm (Active)  Indication for Insertion or Continuance of Line Poor Vasculature-patient has had multiple peripheral attempts or PIVs lasting less than 24 hours 08/08/21 0900  Exposed Catheter (cm) 0 cm 08/08/21 0900  Site Assessment Clean;Dry;Intact 08/08/21 0900  Lumen #1 Status Flushed;Saline locked;Blood return noted 08/08/21 0900  Lumen #2 Status Flushed;Saline locked;Blood return noted 08/08/21 0900  Dressing Type Transparent;Securing device 08/08/21 0900  Dressing Status Clean;Dry;Intact 08/08/21 0900  Antimicrobial disc in place? Yes 08/08/21 0900  Safety Lock Not Applicable [id number]  Line Care Connections checked and tightened 08/08/21 0900  Dressing Intervention New dressing 08/08/21 0900  Dressing Change Due 08/15/21 08/08/21 0900       Holley Bouche Reader 08/08/2021, 9:41 AM

## 2021-08-08 NOTE — Consult Note (Signed)
Ethan Wells , MD [address] [place], [place], [place], [address] [address], [place], [place], [place], [address] Phone: [phone]  Fax: [phone]  Consultation  Referring Provider:   Dr Damita Dunnings Primary Care Physician:  [facility] Primary Gastroenterologist:  Dr. Marius Ditch         Reason for Consultation:     GI bleed  Date of Admission:  08/07/2021 Date of Consultation:  08/08/2021         HPI:   Ethan Wells is a 50 y.o. male who is followed by Dr. Marius Ditch as an outpatient for dyspepsia and known to have had abdominal pain for a year with intermittent nausea and vomiting and history of heavy alcohol abuse and polysubstance abuse in the past.  History of cardiac arrest in 2012.  STEMI with PCI in 2012.  He does have a history of pulmonary hypertension.  He presented to the emergency room with nausea vomiting and abdominal pain.  Sharp and severe intensity no overt blood loss.  In 2022 March he underwent an EGD that showed reactive gastropathy and Barrett's esophagus.  His hemoglobin on admission was 6.7 g down from a baseline of 15 g about 7 months back.  CMP showed a potassium of 2.8 and a creatinine of 0.54 with no elevation in BUN.  Phosphorus is also low at 1.4.    He states that he has been taking NSAIDs namely BC powder on and off but has not done so for the past 1 month.  Complains of heartburn.  Abdominal pain but he would like to eat at this point of time.  No other complaints.  The abdominal pain is no different from what he has at baseline.  Says he takes a PPI. Past Medical History:  Diagnosis Date   CAD (coronary artery disease)    a. 01/2011 Anterior STEMI/Cath/PCI: LM nl, LAD 100d (3.5x2m Vision BMS placed), LCX 220mRI 50, RCA min irregs, EF 40% w/ apical, inferoapical HK.   Cardiac arrest - ventricular fibrillation    a. In setting of STEMI 01/2011   Chronic pain    Cocaine abuse (HCMcCormick   Depression with anxiety    ETOH abuse    a.  6-12 beers/day   GERD (gastroesophageal reflux disease)    Hemorrhoids    Hyperlipidemia    Hypertension    Ischemic cardiomyopathy    a. 06/2011 Echo: EF 45-50%, No rwma   Marijuana abuse    Migraines    Tobacco abuse    a. 1/2 ppd x 26 yrs    Past Surgical History:  Procedure Laterality Date   CORONARY ANGIOPLASTY WITH STENT PLACEMENT     ESOPHAGOGASTRODUODENOSCOPY (EGD) WITH PROPOFOL N/A 03/01/2021   Procedure: ESOPHAGOGASTRODUODENOSCOPY (EGD) WITH PROPOFOL;  Surgeon: VaLin LandsmanMD;  [facility];  Laterality: N/A;    Prior to Admission medications   Medication Sig Start Date End Date Taking? Authorizing Provider  aspirin 81 MG chewable tablet Chew 1 tablet (81 mg total) by mouth daily. 06/07/20  Yes PaFritzi MandesMD  gabapentin (NEURONTIN) 300 MG capsule Take 900 mg by mouth at bedtime.   Yes [provider]  hydrOXYzine (ATARAX/VISTARIL) 25 MG tablet Take 25-50 mg by mouth See admin instructions. Take 1 tablet ('25mg'$ ) by mouth 3 times daily as needed for anxiety and take 2 tablets ('50mg'$ ) by mouth at bedtime as needed for sleep or anxiety   Yes [provider]  lisinopril (ZESTRIL) 5 MG tablet Take 5 mg by mouth daily.   Yes [provider]  Omega-3 Fatty Acids (FISH OIL) 1000 MG CAPS Take 2 capsules by mouth daily.   Yes [provider]  pantoprazole (PROTONIX) 40 MG tablet Take 40 mg by mouth daily.   Yes [provider]  rosuvastatin (CRESTOR) 40 MG tablet Take 40 mg by mouth daily.   Yes [provider]  albuterol (VENTOLIN HFA) 108 (90 Base) MCG/ACT inhaler Inhale 2 puffs into the lungs every 4 (four) hours as needed for wheezing or shortness of breath. Patient not taking: Reported on 08/07/2021 12/09/20   Paulette Blanch, MD  Multiple Vitamin (MULTIVITAMIN WITH MINERALS) TABS tablet Take 1 tablet by mouth daily. Patient not taking: Reported on 08/07/2021 06/07/20   Fritzi Mandes, MD   nitroGLYCERIN (NITROSTAT) 0.4 MG SL tablet Place 1 tablet (0.4 mg total) under the tongue every 5 (five) minutes as needed for chest pain. Patient not taking: Reported on 08/07/2021 06/06/20   Fritzi Mandes, MD    Family History  Problem Relation Age of Onset   Coronary artery disease Mother        alive   Other Other        2 sisters alive and well   Alcohol abuse Father        died @ 13   Other Paternal Grandmother        PACER     Social History   Tobacco Use   Smoking status: Every Day    Packs/day: 0.50    Years: 41.00    Pack years: 20.50    Types: Cigarettes   Smokeless tobacco: Never   Tobacco comments:    started age 57  Vaping Use   Vaping Use: Never used  Substance Use Topics   Alcohol use: Yes    Comment: at least a 6 pack of beer daily and often a 12 pack   Drug use: Not Currently    Types: Cocaine, Marijuana    Comment: (02/22/21 - pt reports none last 6 mos) cocaine used several weeks ago; marijuana today    Allergies as of 08/07/2021 - Review Complete 08/07/2021  Allergen Reaction Noted   Penicillins Hives 02/26/2011   Other Rash 02/22/2021    Review of Systems:    All systems reviewed and negative except where noted in HPI.   Physical Exam:  Vital signs in last 24 hours: Temp:  [97.8 F (36.6 C)-98.6 F (37 C)] 97.9 F (36.6 C) (08/24 0300) Pulse Rate:  [81-91] 90 (08/24 0300) Resp:  [17-20] 18 (08/24 0300) BP: (86-119)/(59-76) 114/68 (08/24 0300) SpO2:  [98 %-100 %] 100 % (08/24 0300) Weight:  [98.7 kg] 98.7 kg (08/23 2219)   General:   Pleasant, cooperative in NAD Head:  Normocephalic and atraumatic. Eyes:   No icterus.   Conjunctiva pink. PERRLA. Ears:  Normal auditory acuity. Neck:  Supple; no masses or thyroidomegaly Lungs: Respirations even and unlabored. Lungs clear to auscultation bilaterally.   No wheezes, crackles, or rhonchi.  Heart:  Regular rate and rhythm;  Without murmur, clicks, rubs or gallops Abdomen:  Soft, nondistended,  nontender. Normal bowel sounds. No appreciable masses or hepatomegaly.  No rebound or guarding.  Neurologic:  Alert and oriented x3;  grossly normal neurologically. Skin:  Intact without significant lesions or rashes. Cervical Nodes:  No significant cervical adenopathy. Psych:  Alert and cooperative. Normal affect.  LAB RESULTS: Recent Labs    08/07/21 1612 08/07/21  2225 08/08/21 0548  WBC 11.8*  --   --   HGB 7.6* 6.7* 8.6*  HCT 21.9* 19.6* 25.1*  PLT 269  --   --    BMET Recent Labs    08/07/21 1612 08/07/21 2225 08/08/21 0548  NA 128* 132* 134*  K 2.4* 2.8* 2.8*  CL 96* 104 103  CO2 21* 20* 20*  GLUCOSE 145* 115* 95  BUN '9 8 7  '$ CREATININE 0.69 0.62 0.54*  CALCIUM 8.0* 7.8* 8.1*   LFT Recent Labs    08/08/21 0548  PROT 7.4  ALBUMIN 2.7*  AST 57*  ALT 17  ALKPHOS 191*  BILITOT 4.1*   PT/INR Recent Labs    08/07/21 1715  LABPROT 14.5  INR 1.1    STUDIES: DG Chest 2 View  Result Date: 08/07/2021 CLINICAL DATA:  Upper abdominal pain for 1 month, nausea vomiting, diaphoresis, short of breath EXAM: CHEST - 2 VIEW COMPARISON:  01/09/2021 FINDINGS: Frontal and lateral views of the chest demonstrate an unremarkable cardiac silhouette. No airspace disease, effusion, or pneumothorax. No acute bony abnormalities. IMPRESSION: 1. No acute intrathoracic process. Electronically Signed   By: Randa Ngo M.D.   On: 08/07/2021 18:04   CT Angio Chest PE W and/or Wo Contrast  Result Date: 08/07/2021 CLINICAL DATA:  Persistent upper abdominal pain for 1 month, nausea and vomiting, diaphoresis, pain with inspiration, back pain EXAM: CT ANGIOGRAPHY CHEST CT ABDOMEN AND PELVIS WITH CONTRAST TECHNIQUE: Multidetector CT imaging of the chest was performed using the standard protocol during bolus administration of intravenous contrast. Multiplanar CT image reconstructions and MIPs were obtained to evaluate the vascular anatomy. Multidetector CT imaging of the abdomen and pelvis was  performed using the standard protocol during bolus administration of intravenous contrast. CONTRAST:  182m OMNIPAQUE IOHEXOL 350 MG/ML SOLN COMPARISON:  01/10/2021 FINDINGS: CTA CHEST FINDINGS Cardiovascular: There is technically adequate opacification of the pulmonary vasculature. No filling defects or pulmonary emboli. No evidence of thoracic aortic aneurysm or dissection. Mild atherosclerosis of the aortic arch. The heart is not enlarged. No pericardial effusion. Mild atherosclerosis of the coronary vasculature. Mediastinum/Nodes: No enlarged mediastinal, hilar, or axillary lymph nodes. Thyroid gland, trachea, and esophagus demonstrate no significant findings. Lungs/Pleura: No acute airspace disease, effusion, or pneumothorax. Stable minimal subpleural scarring at the lung bases. Central airways are patent. Musculoskeletal: No acute or destructive bony lesions. Reconstructed images demonstrate no additional findings. Review of the MIP images confirms the above findings. CT ABDOMEN and PELVIS FINDINGS Hepatobiliary: Liver is enlarged, with heterogeneous fatty infiltration again noted. No focal liver abnormality. The gallbladder is unremarkable. No biliary duct dilation. Pancreas: Unremarkable. No pancreatic ductal dilatation or surrounding inflammatory changes. Spleen: Spleen is mildly enlarged measuring 13.8 x 12.0 x 5.9 cm. No focal parenchymal abnormality. Adrenals/Urinary Tract: Adrenal glands are unremarkable. Kidneys are normal, without renal calculi, focal lesion, or hydronephrosis. Bladder is unremarkable. Stomach/Bowel: No bowel obstruction or ileus. No bowel wall thickening or inflammatory change. Vascular/Lymphatic: Aortic atherosclerosis. No enlarged abdominal or pelvic lymph nodes. Reproductive: Prostate is unremarkable. Other: No free fluid or free gas.  No abdominal wall hernia. Musculoskeletal: No acute or destructive bony lesions. Reconstructed images demonstrate no additional findings. Review of  the MIP images confirms the above findings. IMPRESSION: 1. No acute intrathoracic process. 2. No evidence of pulmonary embolus. 3. No evidence of aortic aneurysm or dissection. 4. Hepatic steatosis. 5. Borderline splenomegaly. 6.  Aortic Atherosclerosis (ICD10-I70.0). Electronically Signed   By: MRanda NgoM.D.   On: 08/07/2021  19:36   CT ABDOMEN PELVIS W CONTRAST  Result Date: 08/07/2021 CLINICAL DATA:  Persistent upper abdominal pain for 1 month, nausea and vomiting, diaphoresis, pain with inspiration, back pain EXAM: CT ANGIOGRAPHY CHEST CT ABDOMEN AND PELVIS WITH CONTRAST TECHNIQUE: Multidetector CT imaging of the chest was performed using the standard protocol during bolus administration of intravenous contrast. Multiplanar CT image reconstructions and MIPs were obtained to evaluate the vascular anatomy. Multidetector CT imaging of the abdomen and pelvis was performed using the standard protocol during bolus administration of intravenous contrast. CONTRAST:  170m OMNIPAQUE IOHEXOL 350 MG/ML SOLN COMPARISON:  01/10/2021 FINDINGS: CTA CHEST FINDINGS Cardiovascular: There is technically adequate opacification of the pulmonary vasculature. No filling defects or pulmonary emboli. No evidence of thoracic aortic aneurysm or dissection. Mild atherosclerosis of the aortic arch. The heart is not enlarged. No pericardial effusion. Mild atherosclerosis of the coronary vasculature. Mediastinum/Nodes: No enlarged mediastinal, hilar, or axillary lymph nodes. Thyroid gland, trachea, and esophagus demonstrate no significant findings. Lungs/Pleura: No acute airspace disease, effusion, or pneumothorax. Stable minimal subpleural scarring at the lung bases. Central airways are patent. Musculoskeletal: No acute or destructive bony lesions. Reconstructed images demonstrate no additional findings. Review of the MIP images confirms the above findings. CT ABDOMEN and PELVIS FINDINGS Hepatobiliary: Liver is enlarged, with  heterogeneous fatty infiltration again noted. No focal liver abnormality. The gallbladder is unremarkable. No biliary duct dilation. Pancreas: Unremarkable. No pancreatic ductal dilatation or surrounding inflammatory changes. Spleen: Spleen is mildly enlarged measuring 13.8 x 12.0 x 5.9 cm. No focal parenchymal abnormality. Adrenals/Urinary Tract: Adrenal glands are unremarkable. Kidneys are normal, without renal calculi, focal lesion, or hydronephrosis. Bladder is unremarkable. Stomach/Bowel: No bowel obstruction or ileus. No bowel wall thickening or inflammatory change. Vascular/Lymphatic: Aortic atherosclerosis. No enlarged abdominal or pelvic lymph nodes. Reproductive: Prostate is unremarkable. Other: No free fluid or free gas.  No abdominal wall hernia. Musculoskeletal: No acute or destructive bony lesions. Reconstructed images demonstrate no additional findings. Review of the MIP images confirms the above findings. IMPRESSION: 1. No acute intrathoracic process. 2. No evidence of pulmonary embolus. 3. No evidence of aortic aneurysm or dissection. 4. Hepatic steatosis. 5. Borderline splenomegaly. 6.  Aortic Atherosclerosis (ICD10-I70.0). Electronically Signed   By: MRanda NgoM.D.   On: 08/07/2021 19:36   [place] SITE RITE  Result Date: 08/08/2021 [facility], placement could not be confirmed due to current cardiac rhythm.  [place]ABDOMEN LIMITED RUQ (LIVER/GB)  Result Date: 08/07/2021 CLINICAL DATA:  Abdominal pain for 1 month, nausea and vomiting, diaphoresis, epigastric pain EXAM: ULTRASOUND ABDOMEN LIMITED RIGHT UPPER QUADRANT COMPARISON:  08/07/2021 FINDINGS: Gallbladder: No gallstones or wall thickening visualized. No sonographic Murphy sign noted by sonographer. Common bile duct: Diameter: 2 mm Liver: There is diffuse increased liver echotexture consistent with hepatic steatosis seen on preceding CT. Evaluation is limited by body habitus. Portal vein is patent on color Doppler  imaging with normal direction of blood flow towards the liver. Other: None. IMPRESSION: 1. Limited study due to body habitus. 2. Hepatic steatosis. Electronically Signed   By: MRanda NgoM.D.   On: 08/07/2021 20:21      Impression / Plan:   WSLEVIN SAURERis a 50y.o. y/o male with history of polysubstance abuse, alcohol abuse, CAD, STEMI and history of Barrett's esophagus, hiatal hernia presents to the emergency room with decreased oral intake nausea vomiting and epigastric pain.  Drop in hemoglobin from baseline no elevation in BUN/creatinine ratio no overt  blood loss.  He has marijuana in his urine.  1.  Very likely has chronic nausea vomiting due to marijuana/polysubstance use leading to acid reflux compounded due to presence of hiatal hernia.  He may have chronic blood loss from esophagitis.  Also may have blood loss from hiatal hernia/Cameron ulcers.  The anemia is normocytic.  Suggest to get iron studies when his potassium and phosphorus has returned back to normal we will plan for an EGD tentatively tomorrow.  2.  Stop all alcohol and marijuana use.  3.  Abnormal LFTs likely due to alcohol/hepatic steatosis no abnormality seen on ultrasound common bile duct diameter 2 mm obtain acute hepatitis panel. I will order labs   4.  Continue multivitamins to prevent Wernicke's encephalopathy, CIWA protocol   I have discussed alternative options, risks & benefits,  which include, but are not limited to, bleeding, infection, perforation,respiratory complication & drug reaction.  The patient agrees with this plan & written consent will be obtained.    Thank you for involving me in the care of this patient.      LOS: 1 day   Ethan Bellows, MD  08/08/2021, 7:36 AM

## 2021-08-08 NOTE — Progress Notes (Signed)
PROGRESS NOTE    SHRISH STATE  [id number] DOB: Aug 31, 1971 DOA: 08/07/2021 PCP: [facility]    Brief Narrative:  Ethan Wells was admitted to the hospital with the working diagnosis of acute gastritis.   50 year old male past medical history for coronary artery disease, history of V. fib arrest during cardiac catheterization, systolic heart failure, alcoholism polysubstance abuse who presented with nausea, vomiting, decreased p.o. intake and epigastric abdominal pain.  Reported 3 weeks of ongoing symptoms, that prompted him to come to the hospital.  On his initial physical examination his blood pressure was 86/63, heart rate 91, respiratory rate 20, temperature 98.6, oxygen saturation 98%, his lungs were clear to auscultation bilaterally, heart S1-S2, present, rhythmic, his abdomen was tender to palpation at the epigastrium, no distention, no lower extremity edema.  Sodium 128, potassium 2.4, chloride 96, bicarb 21, glucose 145, BUN 9, creatinine 0.69, magnesium 1.7, lipase 68, AST 62, ALT 20, total bilirubin 3.9, white count 11.8, hemoglobin 7.6, hematocrit 21.9, platelets 269. SARS COVID-19 negative.  Urinalysis specific gravity 1.009, negative nitrates. Alcohol level less than 10, positive cannabinoid.  Chest radiograph no infiltrates.  EKG 100 bpm, normal axis, QTC 516, sinus rhythm with PVCs, no significant ST segment T wave changes.  Assessment & Plan:   Principal Problem:   Acute gastritis Active Problems:   CAD (coronary artery disease)   Chronic systolic CHF (congestive heart failure) (HCC)   Hypokalemia   History of ST elevation myocardial infarction (STEMI)   Alcohol use disorder, moderate, dependence (HCC)   Hyperbilirubinemia   Acute on chronic blood loss anemia   Acute gastritis, complicated with suspected upper GI bleed, acute blood loss anemia.  Patient this am with no hematemesis or melena. SP 1 unit PRBC transfusion.  Follow up hgb  is 8,6 and hct is 25.1   Plan to continue IV pantoprazole and IV fluids, as needed analgesics and antiemetics Soft diet as tolerated. Add sucralfate TID. Out of bed to chair tid with meals.  EGD on 03.22 with gastritis and Barrett's esophagus. Iron studies with no iron deficiency.  Follow with GI recommendations.   2. Hypokalemia, hyponatremia, hypomagnesemia, hypophosphatemia. Renal function with serum cr at 0,54 with K at 2,8, Mg 1,7 and Na 134, Cl 103. Continue hydration with D5 0,45 saline at 75 ml per hf Add 60 meq Kcl IV today and follow electrolytes in am.  Consult nutrition.   3. Alcohol, tobacco and THC use/ abuse. Last alcoholic drink about 4 weeks ago. On clinical examination no tremors or agitation. Will dc IV lorazepam CIWA protocol and will add po as needed lorazepam. Continue with multivitamins, folic acid and thiamine.   4. Elevated livers enzymes and total bilirubin. AST is 57 and ALT 17, T bilirubin is 4,1 and alkaline phsophatase is 191.  CT abdomen with fatty liver, no biliary duct dilatation and normal gallbladder.  Continue supportive care and follow lover profile on 48 hrs.      Patient continue to be at high risk for worsening gastritis   Status is: Inpatient  Remains inpatient appropriate because:Inpatient level of care appropriate due to severity of illness  Dispo: The patient is from: Home              Anticipated d/c is to: Home              Patient currently is not medically stable to d/c.   Difficult to place patient No    DVT prophylaxis: Scd  Code Status:  full  Family Communication:  No family at the bedside     Consultants:  GI      Subjective: Patient with improved abdominal pain but not yet back to baseline, no nausea or vomiting but decreased po intake.   Objective: Vitals:   08/07/21 2352 08/08/21 0022 08/08/21 0300 08/08/21 0825  BP: 111/73 (!) 105/59 114/68 121/71  Pulse: 85 90 90 91  Resp: '18 18 18 18  '$ Temp: 98 F  (36.7 C) 97.8 F (36.6 C) 97.9 F (36.6 C) 97.6 F (36.4 C)  TempSrc: Oral   Oral  SpO2: 100% 98% 100% 98%  Weight:      Height:        Intake/Output Summary (Last 24 hours) at 08/08/2021 1223 Last data filed at 08/08/2021 [id number] Gross per 24 hour  Intake 2842.21 ml  Output 500 ml  Net 2342.21 ml   Filed Weights   08/07/21 2219  Weight: 98.7 kg    Examination:   General: Not in pain or dyspnea, deconditioned  Neurology: Awake and alert, non focal  E ENT: mild pallor, no icterus, oral mucosa moist Cardiovascular: No JVD. S1-S2 present, rhythmic, no gallops, rubs, or murmurs. No lower extremity edema. Pulmonary: positive breath sounds bilaterally,  with no wheezing, rhonchi or rales. Gastrointestinal. Abdomen mild distended, tender to deep palpation,  Skin. No rashes Musculoskeletal: no joint deformities     Data Reviewed: I have personally reviewed following labs and imaging studies  CBC: Recent Labs  Lab 08/07/21 1612 08/07/21 2225 08/08/21 0548  WBC 11.8*  --   --   HGB 7.6* 6.7* 8.6*  HCT 21.9* 19.6* 25.1*  MCV 89.8  --   --   PLT 269  --   --    Basic Metabolic Panel: Recent Labs  Lab 08/07/21 1612 08/07/21 2225 08/08/21 0548  NA 128* 132* 134*  K 2.4* 2.8* 2.8*  CL 96* 104 103  CO2 21* 20* 20*  GLUCOSE 145* 115* 95  BUN '9 8 7  '$ CREATININE 0.69 0.62 0.54*  CALCIUM 8.0* 7.8* 8.1*  MG 1.7  --   --   PHOS  --  1.4*  --    GFR: Estimated Creatinine Clearance: 120.7 mL/min (A) (by C-G formula based on SCr of 0.54 mg/dL (L)). Liver Function Tests: Recent Labs  Lab 08/07/21 1612 08/08/21 0548  AST 62* 57*  ALT 20 17  ALKPHOS 209* 191*  BILITOT 3.9* 4.1*  PROT 7.6 7.4  ALBUMIN 2.6* 2.7*   Recent Labs  Lab 08/07/21 1612  LIPASE 68*   No results for input(s): AMMONIA in the last 168 hours. Coagulation Profile: Recent Labs  Lab 08/07/21 1715  INR 1.1   Cardiac Enzymes: No results for input(s): CKTOTAL, CKMB, CKMBINDEX, TROPONINI in the  last 168 hours. BNP (last 3 results) No results for input(s): PROBNP in the last 8760 hours. HbA1C: No results for input(s): HGBA1C in the last 72 hours. CBG: Recent Labs  Lab 08/08/21 0528 08/08/21 1150  GLUCAP 108* 108*   Lipid Profile: No results for input(s): CHOL, HDL, LDLCALC, TRIG, CHOLHDL, LDLDIRECT in the last 72 hours. Thyroid Function Tests: No results for input(s): TSH, T4TOTAL, FREET4, T3FREE, THYROIDAB in the last 72 hours. Anemia Panel: Recent Labs    08/08/21 1058  FOLATE 2.4*  TIBC 281  IRON 249*      Radiology Studies: I have reviewed all of the imaging during this hospital visit personally     Scheduled Meds:  Chlorhexidine Gluconate Cloth  6 each Topical Daily   folic acid  1 mg Oral Daily   LORazepam  0-4 mg Intravenous Q6H   Followed by   Derrill Memo ON 08/09/2021] LORazepam  0-4 mg Intravenous Q12H   multivitamin with minerals  1 tablet Oral Daily   pantoprazole (PROTONIX) IV  40 mg Intravenous Q12H   polyethylene glycol-electrolytes  4,000 mL Oral Once   sodium chloride flush  10-40 mL Intracatheter Q12H   thiamine  100 mg Oral Daily   Or   thiamine  100 mg Intravenous Daily   Continuous Infusions:  0.9 % NaCl with KCl 40 mEq / L 100 mL/hr at 08/08/21 1053   potassium chloride 10 mEq (08/08/21 1055)     LOS: 1 day        Ethan Bogle Gerome Apley, MD

## 2021-08-09 ENCOUNTER — Inpatient Hospital Stay: Payer: Medicare HMO | Admitting: Certified Registered"

## 2021-08-09 ENCOUNTER — Encounter
Admission: EM | Disposition: A | Discharge status: Home / Self Care | Payer: Self-pay | Source: Home / Self Care | Attending: Internal Medicine

## 2021-08-09 ENCOUNTER — Encounter: Payer: Self-pay | Admitting: Internal Medicine

## 2021-08-09 DIAGNOSIS — R1013 Epigastric pain: Secondary | ICD-10-CM | POA: Diagnosis not present

## 2021-08-09 DIAGNOSIS — R112 Nausea with vomiting, unspecified: Secondary | ICD-10-CM | POA: Diagnosis not present

## 2021-08-09 DIAGNOSIS — K635 Polyp of colon: Secondary | ICD-10-CM | POA: Diagnosis not present

## 2021-08-09 DIAGNOSIS — R1110 Vomiting, unspecified: Secondary | ICD-10-CM

## 2021-08-09 HISTORY — PX: ESOPHAGOGASTRODUODENOSCOPY (EGD) WITH PROPOFOL: SHX5813

## 2021-08-09 HISTORY — PX: COLONOSCOPY: SHX5424

## 2021-08-09 LAB — HEPATITIS B DNA, ULTRAQUANTITATIVE, PCR
HBV DNA SERPL PCR-ACNC: NOT DETECTED IU/mL
HBV DNA SERPL PCR-LOG IU: UNDETERMINED log10 IU/mL

## 2021-08-09 LAB — MAGNESIUM: Magnesium: 2.3 mg/dL (ref 1.7–2.4)

## 2021-08-09 LAB — BASIC METABOLIC PANEL
Anion gap: 9 (ref 5–15)
BUN: <5 mg/dL — ABNORMAL LOW (ref 6–20)
CO2: 23 mmol/L (ref 22–32)
Calcium: 8.2 mg/dL — ABNORMAL LOW (ref 8.9–10.3)
Chloride: 102 mmol/L (ref 98–111)
Creatinine, Ser: 0.61 mg/dL (ref 0.61–1.24)
GFR, Estimated: >60 mL/min (ref >60)
Glucose, Bld: 131 mg/dL — ABNORMAL HIGH (ref 70–99)
Potassium: 3.1 mmol/L — ABNORMAL LOW (ref 3.5–5.1)
Sodium: 134 mmol/L — ABNORMAL LOW (ref 135–145)

## 2021-08-09 LAB — HCV RNA QUANT: HCV Quantitative: NOT DETECTED IU/mL (ref >50)

## 2021-08-09 LAB — HEMOGLOBIN AND HEMATOCRIT, BLOOD
HCT: 25.3 % — ABNORMAL LOW (ref 39.0–52.0)
Hemoglobin: 8.6 g/dL — ABNORMAL LOW (ref 13.0–17.0)

## 2021-08-09 LAB — GLUCOSE, CAPILLARY
Glucose-Capillary: 100 mg/dL — ABNORMAL HIGH (ref 70–99)
Glucose-Capillary: 114 mg/dL — ABNORMAL HIGH (ref 70–99)
Glucose-Capillary: 128 mg/dL — ABNORMAL HIGH (ref 70–99)
Glucose-Capillary: 130 mg/dL — ABNORMAL HIGH (ref 70–99)
Glucose-Capillary: 157 mg/dL — ABNORMAL HIGH (ref 70–99)

## 2021-08-09 LAB — HEPATITIS B E ANTIGEN: Hep B E Ag: NEGATIVE

## 2021-08-09 SURGERY — ESOPHAGOGASTRODUODENOSCOPY (EGD) WITH PROPOFOL
Anesthesia: General

## 2021-08-09 MED ORDER — LIDOCAINE HCL (CARDIAC) PF 100 MG/5ML IV SOSY
PREFILLED_SYRINGE | INTRAVENOUS | Status: DC | PRN
  Administered 2021-08-09: 50 mg via INTRAVENOUS

## 2021-08-09 MED ORDER — POTASSIUM CHLORIDE CRYS ER 20 MEQ PO TBCR
40.0000 meq | EXTENDED_RELEASE_TABLET | Freq: Once | ORAL | Status: AC
  Administered 2021-08-09: 40 meq via ORAL
  Filled 2021-08-09: qty 2

## 2021-08-09 MED ORDER — SODIUM CHLORIDE 0.9 % IV SOLN: INTRAVENOUS | Status: DC

## 2021-08-09 MED ORDER — PROPOFOL 10 MG/ML IV BOLUS
INTRAVENOUS | Status: DC | PRN
  Administered 2021-08-09: 50 mg via INTRAVENOUS
  Administered 2021-08-09: 30 mg via INTRAVENOUS

## 2021-08-09 MED ORDER — PROPOFOL 500 MG/50ML IV EMUL
INTRAVENOUS | Status: DC | PRN
  Administered 2021-08-09: 150 ug/kg/min via INTRAVENOUS

## 2021-08-09 MED ORDER — PROPOFOL 500 MG/50ML IV EMUL
INTRAVENOUS | Status: AC
  Filled 2021-08-09: qty 50

## 2021-08-09 MED ORDER — HYDROMORPHONE HCL 1 MG/ML IJ SOLN
1.0000 mg | Freq: Four times a day (QID) | INTRAMUSCULAR | Status: DC | PRN
  Administered 2021-08-09 – 2021-08-10 (×2): 1 mg via INTRAVENOUS
  Filled 2021-08-09 (×2): qty 1

## 2021-08-09 MED ORDER — POLYETHYLENE GLYCOL 3350 17 GM/SCOOP PO POWD
1.0000 | Freq: Once | ORAL | Status: AC
  Administered 2021-08-09: 255 g via ORAL
  Filled 2021-08-09: qty 255

## 2021-08-09 MED ORDER — HYDROMORPHONE HCL 1 MG/ML IJ SOLN
1.0000 mg | INTRAMUSCULAR | Status: DC | PRN
  Administered 2021-08-09: 1 mg via INTRAVENOUS
  Filled 2021-08-09: qty 1

## 2021-08-09 MED ORDER — DEXMEDETOMIDINE (PRECEDEX) IN NS 20 MCG/5ML (4 MCG/ML) IV SYRINGE
PREFILLED_SYRINGE | INTRAVENOUS | Status: DC | PRN
  Administered 2021-08-09: 8 ug via INTRAVENOUS
  Administered 2021-08-09: 4 ug via INTRAVENOUS

## 2021-08-09 NOTE — Anesthesia Postprocedure Evaluation (Signed)
Anesthesia Post Note  [name]  Procedure(s) Performed: ESOPHAGOGASTRODUODENOSCOPY (EGD) WITH PROPOFOL COLONOSCOPY  Patient location during evaluation: Endoscopy Anesthesia Type: General Level of consciousness: awake and alert Pain management: pain level controlled Vital Signs Assessment: post-procedure vital signs reviewed and stable Respiratory status: spontaneous breathing, nonlabored ventilation, respiratory function stable and patient connected to nasal cannula oxygen Cardiovascular status: blood pressure returned to baseline and stable Postop Assessment: no apparent nausea or vomiting Anesthetic complications: no   No notable events documented.   Last Vitals:  Vitals:   08/09/21 1104 08/09/21 1134  BP:  101/68  Pulse: 96   Resp: 13   Temp: 36.6 C   SpO2: 96%     Last Pain:  Vitals:   08/09/21 1332  TempSrc:   PainSc: Asleep                 Martha Clan

## 2021-08-09 NOTE — Progress Notes (Signed)
This patient underwent an EGD and colonoscopy for his nausea vomiting and abdominal pain.  The colon had 2 polyps but was otherwise normal.  The stomach did not show any acute findings and the only finding was an irregular Z-line from a history of reflux but no acute inflammation.  The patient's's symptoms are likely central in nature and not from a GI point of view.  The patient should stop his marijuana abuse.  Follow-up as an outpatient with Dr. Marius Ditch.  I will sign off.  Please call if any further GI concerns or questions.  We would like to thank you for the opportunity to participate in the care of Ethan Wells.

## 2021-08-09 NOTE — Op Note (Signed)
[facility] Gastroenterology Patient Name: Ethan Wells Procedure Date: 08/09/2021 10:06 AM MRN: [id number] Account #: [id number] Date of Birth: Feb 11, 1971 Admit Type: Inpatient Age: 50 Room: [facility] ENDO ROOM 4 Gender: Male Note Status: Finalized Procedure:             Upper GI endoscopy Indications:           Generalized abdominal pain, Nausea with vomiting Providers:             Lucilla Lame MD, MD Referring MD:          No Local Md, MD (Referring MD) Medicines:             Propofol per Anesthesia Complications:         No immediate complications. Procedure:             Pre-Anesthesia Assessment:                        - Prior to the procedure, a History and Physical was                         performed, and patient medications and allergies were                         reviewed. The patient's tolerance of previous                         anesthesia was also reviewed. The risks and benefits                         of the procedure and the sedation options and risks                         were discussed with the patient. All questions were                         answered, and informed consent was obtained. Prior                         Anticoagulants: The patient has taken no previous                         anticoagulant or antiplatelet agents. ASA Grade                         Assessment: II - A patient with mild systemic disease.                         After reviewing the risks and benefits, the patient                         was deemed in satisfactory condition to undergo the                         procedure.                        After obtaining informed consent, the endoscope was  passed under direct vision. Throughout the procedure,                         the patient's blood pressure, pulse, and oxygen                         saturations were monitored continuously. The Endoscope                         was introduced  through the mouth, and advanced to the                         second part of duodenum. The upper GI endoscopy was                         accomplished without difficulty. The patient tolerated                         the procedure well. Findings:      The Z-line was irregular.      The stomach was normal.      The examined duodenum was normal. Impression:            - Z-line irregular.                        - Normal stomach.                        - Normal examined duodenum.                        - No specimens collected. Recommendation:        - Return patient to hospital ward for ongoing care.                        - Resume previous diet.                        - Continue present medications.                        - Perform a colonoscopy today. Procedure Code(s):     --- Professional ---                        [phone], Esophagogastroduodenoscopy, flexible,                         transoral; diagnostic, including collection of                         specimen(s) by brushing or washing, when performed                         (separate procedure) Diagnosis Code(s):     --- Professional ---                        R11.2, Nausea with vomiting, unspecified                        R10.84, Generalized abdominal pain  CPT copyright 2019 American Medical Association. All rights reserved. The codes documented in this report are preliminary and upon coder review may  be revised to meet current compliance requirements. Lucilla Lame MD, MD 08/09/2021 10:49:07 AM This report has been signed electronically. Number of Addenda: 0 Note Initiated On: 08/09/2021 10:06 AM Estimated Blood Loss:  Estimated blood loss: none.      [facility]

## 2021-08-09 NOTE — Anesthesia Preprocedure Evaluation (Signed)
Anesthesia Evaluation  Patient identified by MRN, date of birth, ID band Patient awake    Reviewed: Allergy & Precautions, NPO status , Patient's Chart, lab work & pertinent test results  History of Anesthesia Complications Negative for: history of anesthetic complications  Airway Mallampati: III  TM Distance: >3 FB Neck ROM: Full    Dental  (+) Dental Advidsory Given, Missing, Poor Dentition, Chipped   Pulmonary shortness of breath, neg COPD, neg recent URI, Current Smoker and Patient abstained from smoking.,    Pulmonary exam normal        Cardiovascular Exercise Tolerance: Poor hypertension, (-) angina+ CAD, + Past MI, + Cardiac Stents (x1 in 2012) and +CHF  (-) CABG Normal cardiovascular exam+ dysrhythmias (cardiac arrest during STEMI)   Echo 12/2020: 1. Left ventricular ejection fraction, by estimation, is 55 to 60%. The left ventricle has normal function. The left ventricle has no regional wall motion abnormalities. Left ventricular diastolic parameters were normal.  2. Right ventricular systolic function is normal. The right ventricular size is normal.  3. The mitral valve is normal in structure. Trivial mitral valve regurgitation. No evidence of mitral stenosis.  4. The aortic valve is normal in structure. Aortic valve regurgitation is  not visualized. No aortic stenosis is present.  5. The inferior vena cava is normal in size with greater than 50% respiratory variability, suggesting right atrial pressure of 3 mmHg.   "He can proceed with his planned GI workup with endoscopy and colonoscopy without further cardiac workup." Lauree Chandler, cardiology 02/20/2021   Neuro/Psych  Headaches, neg Seizures PSYCHIATRIC DISORDERS Anxiety Depression    GI/Hepatic GERD  Controlled,(+)     substance abuse  alcohol use and cocaine use, Epigastric pain    Endo/Other  negative endocrine ROSBMI 33  Renal/GU negative Renal  ROS     Musculoskeletal negative musculoskeletal ROS (+)   Abdominal (+) + obese,   Peds  Hematology  (+) Blood dyscrasia, anemia ,   Anesthesia Other Findings Past Medical History: No date: CAD (coronary artery disease)     Comment:  a. 01/2011 Anterior STEMI/Cath/PCI: LM nl, LAD 100d               (3.5x36m Vision BMS placed), LCX 228mRI 50, RCA min               irregs, EF 40% w/ apical, inferoapical HK. No date: Cardiac arrest - ventricular fibrillation     Comment:  a. In setting of STEMI 01/2011 No date: Chronic pain No date: [place]: Depression with anxiety No date: ETOH abuse     Comment:  a. 6-12 beers/day No date: GERD (gastroesophageal reflux disease) No date: Hemorrhoids No date: Hyperlipidemia No date: Hypertension No date: Ischemic cardiomyopathy     Comment:  a. 06/2011 Echo: EF 45-50%, No rwma No date: Marijuana abuse No date: Migraines No date: Tobacco abuse     Comment:  a. 1/2 ppd x 26 yrs   Reproductive/Obstetrics negative OB ROS                             Anesthesia Physical  Anesthesia Plan  ASA: 3  Anesthesia Plan: General   Post-op Pain Management:    Induction: Intravenous  PONV Risk Score and Plan: 1 and Propofol infusion, TIVA and Treatment may vary due to age or medical condition  Airway Management Planned: Natural Airway and Nasal Cannula  Additional Equipment: None  Intra-op Plan:   Post-operative Plan:   Informed Consent: I have reviewed the patients History and Physical, chart, labs and discussed the procedure including the risks, benefits and alternatives for the proposed anesthesia with the patient or authorized representative who has indicated his/her understanding and acceptance.     Dental advisory given  Plan Discussed with: CRNA  Anesthesia Plan Comments:         Anesthesia Quick Evaluation

## 2021-08-09 NOTE — Op Note (Signed)
[facility] Gastroenterology Patient Name: Ethan Wells Procedure Date: 08/09/2021 10:29 AM MRN: [id number] Account #: [id number] Date of Birth: 12-Jun-1971 Admit Type: Inpatient Age: 50 Room: [facility] ENDO ROOM 4 Gender: Male Note Status: Finalized Procedure:             Colonoscopy Indications:           Generalized abdominal pain Providers:             Lucilla Lame MD, MD Medicines:             Propofol per Anesthesia Complications:         No immediate complications. Procedure:             Pre-Anesthesia Assessment:                        - Prior to the procedure, a History and Physical was                         performed, and patient medications and allergies were                         reviewed. The patient's tolerance of previous                         anesthesia was also reviewed. The risks and benefits                         of the procedure and the sedation options and risks                         were discussed with the patient. All questions were                         answered, and informed consent was obtained. Prior                         Anticoagulants: The patient has taken no previous                         anticoagulant or antiplatelet agents. ASA Grade                         Assessment: II - A patient with mild systemic disease.                         After reviewing the risks and benefits, the patient                         was deemed in satisfactory condition to undergo the                         procedure.                        After obtaining informed consent, the colonoscope was                         passed under direct vision. Throughout the procedure,  the patient's blood pressure, pulse, and oxygen                         saturations were monitored continuously. The                         Colonoscope was introduced through the anus and                         advanced to the the cecum, identified  by appendiceal                         orifice and ileocecal valve. The colonoscopy was                         performed without difficulty. The patient tolerated                         the procedure well. The quality of the bowel                         preparation was good. Findings:      The perianal and digital rectal examinations were normal.      Two sessile polyps were found in the ascending colon. The polyps were 5       to 7 mm in size. These polyps were removed with a cold snare. Resection       and retrieval were complete.      Non-bleeding internal hemorrhoids were found during retroflexion. The       hemorrhoids were Grade II (internal hemorrhoids that prolapse but reduce       spontaneously). Impression:            - Two 5 to 7 mm polyps in the ascending colon, removed                         with a cold snare. Resected and retrieved.                        - Non-bleeding internal hemorrhoids. Recommendation:        - Discharge patient to home.                        - Resume previous diet.                        - Continue present medications.                        - Await pathology results.                        - Repeat colonoscopy in 7 years for surveillance if                         adenomatous otherwise 10 years. Procedure Code(s):     --- Professional ---                        [phone], Colonoscopy, flexible; with removal of  tumor(s), polyp(s), or other lesion(s) by snare                         technique Diagnosis Code(s):     --- Professional ---                        R10.84, Generalized abdominal pain                        K63.5, Polyp of colon CPT copyright 2019 American Medical Association. All rights reserved. The codes documented in this report are preliminary and upon coder review may  be revised to meet current compliance requirements. Lucilla Lame MD, MD 08/09/2021 11:05:05 AM This report has been signed  electronically. Number of Addenda: 0 Note Initiated On: 08/09/2021 10:29 AM Scope Withdrawal Time: 0 hours 6 minutes 38 seconds  Total Procedure Duration: 0 hours 9 minutes 49 seconds  Estimated Blood Loss:  Estimated blood loss: none.      [facility]

## 2021-08-09 NOTE — TOC Initial Note (Signed)
Transition of Care [facility]) - Initial/Assessment Note    Patient Details  Name: Ethan Wells MRN: [id number] Date of Birth: November 05, 1971  [facility] CM/SW Contact:    Beverly Sessions, RN Phone Number: 08/09/2021, 12:33 PM  Clinical Narrative:                  Patient off the floor for EGD and colonoscopy  Substance abuse resources left at bedside.   Per MD note patient states that he hasn't drank alcohol in 4 weeks        Patient Goals and CMS Choice        Expected Discharge Plan and Services                                                Prior Living Arrangements/Services                       Activities of Daily Living Home Assistive Devices/Equipment: None ADL Screening (condition at time of admission) Patient's cognitive ability adequate to safely complete daily activities?: Yes Is the patient deaf or have difficulty hearing?: No Does the patient have difficulty seeing, even when wearing glasses/contacts?: No Does the patient have difficulty concentrating, remembering, or making decisions?: No Patient able to express need for assistance with ADLs?: Yes Does the patient have difficulty dressing or bathing?: No Independently performs ADLs?: No Communication: Independent Dressing (OT): Needs assistance Grooming: Needs assistance Feeding: Independent Bathing: Needs assistance Toileting: Needs assistance In/Out Bed: Needs assistance Walks in Home: Independent Does the patient have difficulty walking or climbing stairs?: Yes Weakness of Legs: Both Weakness of Arms/Hands: None  Permission Sought/Granted                  Emotional Assessment              Admission diagnosis:  Epigastric abdominal pain [R10.13] Serum total bilirubin elevated [R17] Acute gastritis [K29.00] Non-intractable vomiting with nausea, unspecified vomiting type [R11.2] Patient Active Problem List   Diagnosis Date Noted    Non-intractable vomiting    Epigastric abdominal pain    Polyp of ascending colon    Acute on chronic blood loss anemia 08/07/2021   Acute gastritis    Hyperbilirubinemia    Elevated troponin    Notalgia    NSTEMI (non-ST elevated myocardial infarction) ([place]) 12/19/2020   History of ST elevation myocardial infarction (STEMI) 12/19/2020   Alcohol use disorder, moderate, dependence ([place]) 12/19/2020   Depression with anxiety 10/05/2020   Esophageal reflux 10/05/2020   Headache syndrome 10/05/2020   Hyperlipidemia 10/05/2020   Hyponatremia 06/04/2020   Hypomagnesemia [id number]   Chronic systolic CHF (congestive heart failure) ([place]) 06/04/2020   Leukocytosis 06/04/2020   Abdominal pain 06/04/2020   Hypokalemia 06/04/2020   Ischemic cardiomyopathy    Polysubstance abuse ([place])    Chest pain 07/12/2019   Abdominal pain, RUQ 05/25/2018   Chest pain, mid sternal 11/17/2012   Cocaine abuse ([place])    Marijuana abuse    TOBACCO ABUSE 02/26/2011   HYPERTENSION, BENIGN 02/26/2011   CAD (coronary artery disease) 02/26/2011   Other specified forms of chronic ischemic heart disease 02/26/2011   PCP:  [facility]   [id number] [place] [address] [id number]: [phone]:  [phone]     Social Determinants of Health (SDOH) Interventions    Readmission Risk Interventions No flowsheet data found.

## 2021-08-09 NOTE — Anesthesia Procedure Notes (Signed)
Procedure Name: MAC Date/Time: 08/09/2021 10:41 AM Performed by: Jerrye Noble, CRNA Pre-anesthesia Checklist: Patient identified, Suction available, Patient being monitored and Emergency Drugs available Patient Re-evaluated:Patient Re-evaluated prior to induction Oxygen Delivery Method: Nasal cannula

## 2021-08-09 NOTE — Progress Notes (Signed)
PROGRESS NOTE    Ethan Wells  [id number] DOB: October 29, 1971 DOA: 08/07/2021 PCP: [facility]    Brief Narrative:  50 year old male past medical history for coronary artery disease, history of V. fib arrest during cardiac catheterization, systolic heart failure, alcoholism polysubstance abuse who presented with nausea, vomiting, decreased p.o. intake and epigastric abdominal pain.  Reported 3 weeks of ongoing symptoms, that prompted him to come to the hospital.    Assessment & Plan:   Principal Problem:   Acute gastritis Active Problems:   CAD (coronary artery disease)   Chronic systolic CHF (congestive heart failure) (HCC)   Hypokalemia   History of ST elevation myocardial infarction (STEMI)   Alcohol use disorder, moderate, dependence (HCC)   Hyperbilirubinemia   Acute on chronic blood loss anemia   Acute gastritis, complicated with suspected upper GI bleed, acute blood loss anemia.  Patient this am with no hematemesis or melena. SP 1 unit PRBC transfusion.  Repeat hemoglobin 8.6 GI consulted for follow-up, EGD/colonoscopy 08/09/2021; colonoscopy shows 2 colonic polyps but otherwise unremarkable, EGD without acute findings -recommending further outpatient work-up Continue PPI transition to p.o., continue sucralfate Follow repeat labs  Hypokalemia, hyponatremia, hypomagnesemia, hypophosphatemia.  Likely in setting of poor p.o. intake per patient she said very minimal intake over the past few weeks Dietary following, appreciate insight and recommendations, continue supplements diet as tolerated  Alcohol, tobacco and THC use/ abuse.  - Last alcohol use 4 weeks ago without signs or symptoms of withdrawal at this point continue to monitor  Continue with multivitamins, folic acid and thiamine.  -Pain medication seeking behavior overnight and this morning despite high-dose IV and p.o. narcotics he continues to request increased dose and frequency despite being  somnolent  Elevated liver enzymes and total bilirubin.  Likely secondary to above polysubstance use and abuse CT abdomen consistent with fatty liver without any acute findings for obstruction Hepatitis panel non-reactive  Status is: Inpatient  Remains inpatient appropriate because:Inpatient level of care appropriate due to severity of illness  Dispo: The patient is from: Home              Anticipated d/c is to: Home              Patient currently is not medically stable to d/c.   Difficult to place patient No    DVT prophylaxis: Scd  Code Status:   full  Family Communication:  No family at the bedside     Consultants:  GI   Subjective: Patient continues to ask for increased narcotic regimen despite appearing well and asymptomatic, concern for polysubstance abuse.  Denies nausea vomiting diarrhea constipation headache fevers or chills.  Objective: Vitals:   08/08/21 1544 08/08/21 1928 08/08/21 1951 08/09/21 0529  BP: 120/76 109/64 113/73 121/76  Pulse: 91 86 82 90  Resp: '18 18 17 16  '$ Temp: 97.6 F (36.4 C) 98 F (36.7 C) 97.6 F (36.4 C) 97.7 F (36.5 C)  TempSrc: Oral Oral Oral Oral  SpO2: 96% 99% 100% 100%  Weight:      Height:        Intake/Output Summary (Last 24 hours) at 08/09/2021 0748 Last data filed at 08/09/2021 [id number] Gross per 24 hour  Intake 3173.31 ml  Output 1150 ml  Net 2023.31 ml    Filed Weights   08/07/21 2219  Weight: 98.7 kg    Examination:   General: Not in pain or dyspnea, deconditioned  Neurology: Awake and alert, non focal  E ENT:  mild pallor, no icterus, oral mucosa moist Cardiovascular: No JVD. S1-S2 present, rhythmic, no gallops, rubs, or murmurs. No lower extremity edema. Pulmonary: positive breath sounds bilaterally,  with no wheezing, rhonchi or rales. Gastrointestinal. Abdomen mild distended, tender to deep palpation,  Skin. No rashes Musculoskeletal: no joint deformities     Data Reviewed: I have personally reviewed  following labs and imaging studies  CBC: Recent Labs  Lab 08/07/21 1612 08/07/21 2225 08/08/21 0548 08/09/21 0452  WBC 11.8*  --   --   --   HGB 7.6* 6.7* 8.6* 8.6*  HCT 21.9* 19.6* 25.1* 25.3*  MCV 89.8  --   --   --   PLT 269  --   --   --     Basic Metabolic Panel: Recent Labs  Lab 08/07/21 1612 08/07/21 2225 08/08/21 0548 08/09/21 0452  NA 128* 132* 134* 134*  K 2.4* 2.8* 2.8* 3.1*  CL 96* 104 103 102  CO2 21* 20* 20* 23  GLUCOSE 145* 115* 95 131*  BUN '9 8 7 '$ <5*  CREATININE 0.69 0.62 0.54* 0.61  CALCIUM 8.0* 7.8* 8.1* 8.2*  MG 1.7  --   --  2.3  PHOS  --  1.4*  --   --     GFR: Estimated Creatinine Clearance: 120.7 mL/min (by C-G formula based on SCr of 0.61 mg/dL). Liver Function Tests: Recent Labs  Lab 08/07/21 1612 08/08/21 0548  AST 62* 57*  ALT 20 17  ALKPHOS 209* 191*  BILITOT 3.9* 4.1*  PROT 7.6 7.4  ALBUMIN 2.6* 2.7*    Recent Labs  Lab 08/07/21 1612  LIPASE 68*    No results for input(s): AMMONIA in the last 168 hours. Coagulation Profile: Recent Labs  Lab 08/07/21 1715  INR 1.1    Cardiac Enzymes: No results for input(s): CKTOTAL, CKMB, CKMBINDEX, TROPONINI in the last 168 hours. BNP (last 3 results) No results for input(s): PROBNP in the last 8760 hours. HbA1C: No results for input(s): HGBA1C in the last 72 hours. CBG: Recent Labs  Lab 08/08/21 0528 08/08/21 1150 08/08/21 1806 08/09/21 0041 08/09/21 0553  GLUCAP 108* 108* 141* 114* 157*    Lipid Profile: No results for input(s): CHOL, HDL, LDLCALC, TRIG, CHOLHDL, LDLDIRECT in the last 72 hours. Thyroid Function Tests: No results for input(s): TSH, T4TOTAL, FREET4, T3FREE, THYROIDAB in the last 72 hours. Anemia Panel: Recent Labs    08/08/21 1055 08/08/21 1058  VITAMINB12 738  --   FOLATE  --  2.4*  TIBC  --  281  IRON  --  249*    Radiology Studies: I have reviewed all of the imaging during this hospital visit personally  Scheduled Meds:  acetaminophen   500 mg Oral QID   Chlorhexidine Gluconate Cloth  6 each Topical Daily   folic acid  1 mg Oral Daily   multivitamin with minerals  1 tablet Oral Daily   pantoprazole (PROTONIX) IV  40 mg Intravenous Q12H   sodium chloride flush  10-40 mL Intracatheter Q12H   sucralfate  1 g Oral TID WC & HS   thiamine  100 mg Oral Daily   Continuous Infusions:  dextrose 5 % and 0.45% NaCl 100 mL/hr at 08/08/21 1346     LOS: 2 days   Little Ishikawa, MD

## 2021-08-09 NOTE — Transfer of Care (Signed)
Immediate Anesthesia Transfer of Care Note  Patient: FRANZ ASELTINE  Procedure(s) Performed: ESOPHAGOGASTRODUODENOSCOPY (EGD) WITH PROPOFOL COLONOSCOPY  Patient Location: PACU  Anesthesia Type:General  Level of Consciousness: drowsy and patient cooperative  Airway & Oxygen Therapy: Patient Spontanous Breathing  Post-op Assessment: Report given to RN and Post -op Vital signs reviewed and stable  Post vital signs: Reviewed and stable  Last Vitals:  Vitals Value Taken Time  BP 95/65 08/09/21 1105  Temp 36.6 C 08/09/21 1104  Pulse 96 08/09/21 1106  Resp 14 08/09/21 1106  SpO2 99 % 08/09/21 1106  Vitals shown include unvalidated device data.  Last Pain:  Vitals:   08/09/21 1104  TempSrc: Temporal  PainSc: Asleep      Patients Stated Pain Goal: 1 ([phone] [phone])  Complications: No notable events documented.

## 2021-08-10 ENCOUNTER — Encounter: Payer: Self-pay | Admitting: Gastroenterology

## 2021-08-10 ENCOUNTER — Ambulatory Visit: Payer: Medicare HMO | Admitting: Gastroenterology

## 2021-08-10 DIAGNOSIS — F1290 Cannabis use, unspecified, uncomplicated: Secondary | ICD-10-CM

## 2021-08-10 LAB — GLUCOSE, CAPILLARY
Glucose-Capillary: 101 mg/dL — ABNORMAL HIGH (ref 70–99)
Glucose-Capillary: 130 mg/dL — ABNORMAL HIGH (ref 70–99)
Glucose-Capillary: 123 mg/dL — ABNORMAL HIGH (ref 70–99)

## 2021-08-10 LAB — BASIC METABOLIC PANEL
Anion gap: 4 — ABNORMAL LOW (ref 5–15)
BUN: <5 mg/dL — ABNORMAL LOW (ref 6–20)
CO2: 24 mmol/L (ref 22–32)
Calcium: 7.8 mg/dL — ABNORMAL LOW (ref 8.9–10.3)
Chloride: 109 mmol/L (ref 98–111)
Creatinine, Ser: 0.65 mg/dL (ref 0.61–1.24)
GFR, Estimated: >60 mL/min (ref >60)
Glucose, Bld: 109 mg/dL — ABNORMAL HIGH (ref 70–99)
Potassium: 3.6 mmol/L (ref 3.5–5.1)
Sodium: 137 mmol/L (ref 135–145)

## 2021-08-10 LAB — SURGICAL PATHOLOGY

## 2021-08-10 LAB — HEPATITIS B E ANTIBODY: Hep B E Ab: NEGATIVE

## 2021-08-10 LAB — PHOSPHORUS: Phosphorus: 2 mg/dL — ABNORMAL LOW (ref 2.5–4.6)

## 2021-08-10 MED ORDER — SUCRALFATE 1 GM/10ML PO SUSP: 1.0000 g | Freq: Three times a day (TID) | ORAL | 0 refills | Status: DC

## 2021-08-10 MED ORDER — ADULT MULTIVITAMIN W/MINERALS CH: 1.0000 | ORAL_TABLET | Freq: Every day | ORAL | 0 refills | Status: DC

## 2021-08-10 NOTE — Progress Notes (Signed)
O2 placed on pt at 2l as per md's orders.

## 2021-08-10 NOTE — Care Management Important Message (Signed)
Important Message  Patient Details  Name: Ethan Wells MRN: [id number] Date of Birth: June 13, 1971   Medicare Important Message Given:  Yes     [name] 08/10/2021, 11:05 AM

## 2021-08-10 NOTE — Progress Notes (Signed)
Pt voiced he did not want to discharge today-MD made aware.

## 2021-08-10 NOTE — Discharge Summary (Signed)
Physician Discharge Summary  Ethan Wells [id number] DOB: Apr 24, 1971 DOA: 08/07/2021  PCP: [facility]  Admit date: 08/07/2021 Discharge date: 08/10/2021  Admitted From: Home Disposition: Home  Recommendations for Outpatient Follow-up:  Follow up with PCP in 1-2 weeks Please obtain BMP/CBC in one week Please follow up with GI as scheduled  Home Health: None Equipment/Devices: None  Discharge Condition: Stable CODE STATUS: Full Diet recommendation: Low-salt low-fat diet  Brief/Interim Summary: 50 year old male past medical history for coronary artery disease, history of V. fib arrest during cardiac catheterization, systolic heart failure, alcoholism polysubstance abuse who presented with nausea, vomiting, decreased p.o. intake and epigastric abdominal pain.  Reported 3 weeks of ongoing symptoms, that prompted him to come to the hospital.     Assessment & Plan:   Acute gastritis ruled out Acute GI bleed ruled out, acute blood loss anemia Cannot rule out marijuana hyperemesis syndrome Hemoglobin stable GI recommending further outpatient work-up given negative imaging and endoscopy here Patient's abdominal pain likely secondary to poor diet, alcohol use, possible marijuana hyperemesis syndrome   Hypokalemia, hyponatremia, hypomagnesemia, hypophosphatemia.  Continues to improve with increased p.o. intake Recommend continued well-rounded diet, recommending diet high in fresh vegetables and avoiding greasy or fried foods   Alcohol, tobacco and THC use/ abuse.  -Lengthy discussion that these are likely the primary etiology of his abdominal symptoms -Recommend complete discontinuation of alcohol tobacco and marijuana   Elevated liver enzymes and total bilirubin.  CT abdomen consistent with fatty liver, hepatitis panel negative Likely in setting of chronic alcohol use and poor p.o. intake    Discharge Instructions  Discharge Instructions     Call MD  for:  persistant nausea and vomiting   Complete by: As directed    Call MD for:  severe uncontrolled pain   Complete by: As directed    Diet - low sodium heart healthy   Complete by: As directed    Increase activity slowly   Complete by: As directed       Allergies as of 08/10/2021       Reactions   Penicillins Hives   Other Rash   chives        Medication List     STOP taking these medications    albuterol 108 (90 Base) MCG/ACT inhaler Commonly known as: VENTOLIN HFA   lisinopril 5 MG tablet Commonly known as: ZESTRIL   nitroGLYCERIN 0.4 MG SL tablet Commonly known as: NITROSTAT       TAKE these medications    aspirin 81 MG chewable tablet Chew 1 tablet (81 mg total) by mouth daily.   Fish Oil 1000 MG Caps Take 2 capsules by mouth daily.   gabapentin 300 MG capsule Commonly known as: NEURONTIN Take 900 mg by mouth at bedtime.   hydrOXYzine 25 MG tablet Commonly known as: ATARAX/VISTARIL Take 25-50 mg by mouth See admin instructions. Take 1 tablet ('25mg'$ ) by mouth 3 times daily as needed for anxiety and take 2 tablets ('50mg'$ ) by mouth at bedtime as needed for sleep or anxiety   multivitamin with minerals Tabs tablet Take 1 tablet by mouth daily.   pantoprazole 40 MG tablet Commonly known as: PROTONIX Take 40 mg by mouth daily.   rosuvastatin 40 MG tablet Commonly known as: CRESTOR Take 40 mg by mouth daily.   sucralfate 1 GM/10ML suspension Commonly known as: CARAFATE Take 10 mLs (1 g total) by mouth 4 (four) times daily -  with meals and at bedtime.  Allergies  Allergen Reactions   Penicillins Hives   Other Rash    chives    Consultations: GI  Procedures/Studies: DG Chest 2 View  Result Date: 08/07/2021 CLINICAL DATA:  Upper abdominal pain for 1 month, nausea vomiting, diaphoresis, short of breath EXAM: CHEST - 2 VIEW COMPARISON:  01/09/2021 FINDINGS: Frontal and lateral views of the chest demonstrate an unremarkable cardiac  silhouette. No airspace disease, effusion, or pneumothorax. No acute bony abnormalities. IMPRESSION: 1. No acute intrathoracic process. Electronically Signed   By: Randa Ngo M.D.   On: 08/07/2021 18:04   CT Angio Chest PE W and/or Wo Contrast  Result Date: 08/07/2021 CLINICAL DATA:  Persistent upper abdominal pain for 1 month, nausea and vomiting, diaphoresis, pain with inspiration, back pain EXAM: CT ANGIOGRAPHY CHEST CT ABDOMEN AND PELVIS WITH CONTRAST TECHNIQUE: Multidetector CT imaging of the chest was performed using the standard protocol during bolus administration of intravenous contrast. Multiplanar CT image reconstructions and MIPs were obtained to evaluate the vascular anatomy. Multidetector CT imaging of the abdomen and pelvis was performed using the standard protocol during bolus administration of intravenous contrast. CONTRAST:  154m OMNIPAQUE IOHEXOL 350 MG/ML SOLN COMPARISON:  01/10/2021 FINDINGS: CTA CHEST FINDINGS Cardiovascular: There is technically adequate opacification of the pulmonary vasculature. No filling defects or pulmonary emboli. No evidence of thoracic aortic aneurysm or dissection. Mild atherosclerosis of the aortic arch. The heart is not enlarged. No pericardial effusion. Mild atherosclerosis of the coronary vasculature. Mediastinum/Nodes: No enlarged mediastinal, hilar, or axillary lymph nodes. Thyroid gland, trachea, and esophagus demonstrate no significant findings. Lungs/Pleura: No acute airspace disease, effusion, or pneumothorax. Stable minimal subpleural scarring at the lung bases. Central airways are patent. Musculoskeletal: No acute or destructive bony lesions. Reconstructed images demonstrate no additional findings. Review of the MIP images confirms the above findings. CT ABDOMEN and PELVIS FINDINGS Hepatobiliary: Liver is enlarged, with heterogeneous fatty infiltration again noted. No focal liver abnormality. The gallbladder is unremarkable. No biliary duct  dilation. Pancreas: Unremarkable. No pancreatic ductal dilatation or surrounding inflammatory changes. Spleen: Spleen is mildly enlarged measuring 13.8 x 12.0 x 5.9 cm. No focal parenchymal abnormality. Adrenals/Urinary Tract: Adrenal glands are unremarkable. Kidneys are normal, without renal calculi, focal lesion, or hydronephrosis. Bladder is unremarkable. Stomach/Bowel: No bowel obstruction or ileus. No bowel wall thickening or inflammatory change. Vascular/Lymphatic: Aortic atherosclerosis. No enlarged abdominal or pelvic lymph nodes. Reproductive: Prostate is unremarkable. Other: No free fluid or free gas.  No abdominal wall hernia. Musculoskeletal: No acute or destructive bony lesions. Reconstructed images demonstrate no additional findings. Review of the MIP images confirms the above findings. IMPRESSION: 1. No acute intrathoracic process. 2. No evidence of pulmonary embolus. 3. No evidence of aortic aneurysm or dissection. 4. Hepatic steatosis. 5. Borderline splenomegaly. 6.  Aortic Atherosclerosis (ICD10-I70.0). Electronically Signed   By: MRanda NgoM.D.   On: 08/07/2021 19:36   CT ABDOMEN PELVIS W CONTRAST  Result Date: 08/07/2021 CLINICAL DATA:  Persistent upper abdominal pain for 1 month, nausea and vomiting, diaphoresis, pain with inspiration, back pain EXAM: CT ANGIOGRAPHY CHEST CT ABDOMEN AND PELVIS WITH CONTRAST TECHNIQUE: Multidetector CT imaging of the chest was performed using the standard protocol during bolus administration of intravenous contrast. Multiplanar CT image reconstructions and MIPs were obtained to evaluate the vascular anatomy. Multidetector CT imaging of the abdomen and pelvis was performed using the standard protocol during bolus administration of intravenous contrast. CONTRAST:  1091mOMNIPAQUE IOHEXOL 350 MG/ML SOLN COMPARISON:  01/10/2021 FINDINGS: CTA CHEST FINDINGS Cardiovascular: There  is technically adequate opacification of the pulmonary vasculature. No filling  defects or pulmonary emboli. No evidence of thoracic aortic aneurysm or dissection. Mild atherosclerosis of the aortic arch. The heart is not enlarged. No pericardial effusion. Mild atherosclerosis of the coronary vasculature. Mediastinum/Nodes: No enlarged mediastinal, hilar, or axillary lymph nodes. Thyroid gland, trachea, and esophagus demonstrate no significant findings. Lungs/Pleura: No acute airspace disease, effusion, or pneumothorax. Stable minimal subpleural scarring at the lung bases. Central airways are patent. Musculoskeletal: No acute or destructive bony lesions. Reconstructed images demonstrate no additional findings. Review of the MIP images confirms the above findings. CT ABDOMEN and PELVIS FINDINGS Hepatobiliary: Liver is enlarged, with heterogeneous fatty infiltration again noted. No focal liver abnormality. The gallbladder is unremarkable. No biliary duct dilation. Pancreas: Unremarkable. No pancreatic ductal dilatation or surrounding inflammatory changes. Spleen: Spleen is mildly enlarged measuring 13.8 x 12.0 x 5.9 cm. No focal parenchymal abnormality. Adrenals/Urinary Tract: Adrenal glands are unremarkable. Kidneys are normal, without renal calculi, focal lesion, or hydronephrosis. Bladder is unremarkable. Stomach/Bowel: No bowel obstruction or ileus. No bowel wall thickening or inflammatory change. Vascular/Lymphatic: Aortic atherosclerosis. No enlarged abdominal or pelvic lymph nodes. Reproductive: Prostate is unremarkable. Other: No free fluid or free gas.  No abdominal wall hernia. Musculoskeletal: No acute or destructive bony lesions. Reconstructed images demonstrate no additional findings. Review of the MIP images confirms the above findings. IMPRESSION: 1. No acute intrathoracic process. 2. No evidence of pulmonary embolus. 3. No evidence of aortic aneurysm or dissection. 4. Hepatic steatosis. 5. Borderline splenomegaly. 6.  Aortic Atherosclerosis (ICD10-I70.0). Electronically Signed    By: Randa Ngo M.D.   On: 08/07/2021 19:36   [place] EKG SITE RITE  Result Date: 08/08/2021 If [facility] image not attached, placement could not be confirmed due to current cardiac rhythm.  US ABDOMEN LIMITED RUQ (LIVER/GB)  Result Date: 08/07/2021 CLINICAL DATA:  Abdominal pain for 1 month, nausea and vomiting, diaphoresis, epigastric pain EXAM: ULTRASOUND ABDOMEN LIMITED RIGHT UPPER QUADRANT COMPARISON:  08/07/2021 FINDINGS: Gallbladder: No gallstones or wall thickening visualized. No sonographic Murphy sign noted by sonographer. Common bile duct: Diameter: 2 mm Liver: There is diffuse increased liver echotexture consistent with hepatic steatosis seen on preceding CT. Evaluation is limited by body habitus. Portal vein is patent on color Doppler imaging with normal direction of blood flow towards the liver. Other: None. IMPRESSION: 1. Limited study due to body habitus. 2. Hepatic steatosis. Electronically Signed   By: Randa Ngo M.D.   On: 08/07/2021 20:21     Subjective: No acute issues or events overnight, abdominal pain improving tolerating p.o. without difficulty denies nausea vomiting diarrhea constipation headache fevers or chills   Discharge Exam: Vitals:   08/10/21 0757 08/10/21 1556  BP: 113/74 114/74  Pulse: 85 79  Resp: 18 18  Temp: 98.4 F (36.9 C) 98.4 F (36.9 C)  SpO2: 100% 97%   Vitals:   08/09/21 1941 08/10/21 0504 08/10/21 0757 08/10/21 1556  BP: (!) 116/53 128/74 113/74 114/74  Pulse: 82 87 85 79  Resp: '18 20 18 18  '$ Temp: 97.6 F (36.4 C) 97.7 F (36.5 C) 98.4 F (36.9 C) 98.4 F (36.9 C)  TempSrc: Oral Oral Oral Oral  SpO2: 99% 100% 100% 97%  Weight:      Height:        General: Pt is alert, awake, not in acute distress Cardiovascular: RRR, S1/S2 +, no rubs, no gallops Respiratory: CTA bilaterally, no wheezing, no rhonchi Abdominal: Soft, NT, ND, bowel sounds +  Extremities: no edema, no cyanosis    The results of significant diagnostics from  this hospitalization (including imaging, microbiology, ancillary and laboratory) are listed below for reference.     Microbiology: Recent Results (from the past 240 hour(s))  Resp Panel by RT-PCR (Flu A&B, Covid) Nasopharyngeal Swab     Status: None   Collection Time: 08/07/21  5:18 PM   Specimen: Nasopharyngeal Swab; Nasopharyngeal(NP) swabs in vial transport medium  Result Value Ref Range Status   SARS Coronavirus 2 by RT PCR NEGATIVE NEGATIVE Final    Comment: (NOTE) SARS-CoV-2 target nucleic acids are NOT DETECTED.  The SARS-CoV-2 RNA is generally detectable in upper respiratory specimens during the acute phase of infection. The lowest concentration of SARS-CoV-2 viral copies this assay can detect is 138 copies/mL. A negative result does not preclude SARS-Cov-2 infection and should not be used as the sole basis for treatment or other patient management decisions. A negative result may occur with  improper specimen collection/handling, submission of specimen other than nasopharyngeal swab, presence of viral mutation(s) within the areas targeted by this assay, and inadequate number of viral copies(<138 copies/mL). A negative result must be combined with clinical observations, patient history, and epidemiological information. The expected result is Negative.  Fact Sheet for Patients:  EntrepreneurPulse.com.au  Fact Sheet for Healthcare Providers:  IncredibleEmployment.be  This test is no t yet approved or cleared by the [place] FDA and  has been authorized for detection and/or diagnosis of SARS-CoV-2 by FDA under an Emergency Use Authorization (EUA). This EUA will remain  in effect (meaning this test can be used) for the duration of the COVID-19 declaration under Section 564(b)(1) of the Act, 21 U.S.C.section 360bbb-3(b)(1), unless the authorization is terminated  or revoked sooner.       Influenza A by PCR NEGATIVE NEGATIVE Final    Influenza B by PCR NEGATIVE NEGATIVE Final    Comment: (NOTE) The Xpert Xpress SARS-CoV-2/FLU/RSV plus assay is intended as an aid in the diagnosis of influenza from Nasopharyngeal swab specimens and should not be used as a sole basis for treatment. Nasal washings and aspirates are unacceptable for Xpert Xpress SARS-CoV-2/FLU/RSV testing.  Fact Sheet for Patients: EntrepreneurPulse.com.au  Fact Sheet for Healthcare Providers: IncredibleEmployment.be  This test is not yet approved or cleared by the [place] FDA and has been authorized for detection and/or diagnosis of SARS-CoV-2 by FDA under an Emergency Use Authorization (EUA). This EUA will remain in effect (meaning this test can be used) for the duration of the COVID-19 declaration under Section 564(b)(1) of the Act, 21 U.S.C. section 360bbb-3(b)(1), unless the authorization is terminated or revoked.  Performed at [facility], [address] [place], [place] [address]      Labs: BNP (last 3 results) No results for input(s): BNP in the last 8760 hours. Basic Metabolic Panel: Recent Labs  Lab 08/07/21 1612 08/07/21 2225 08/08/21 0548 08/09/21 0452 08/10/21 0429  NA 128* 132* 134* 134* 137  K 2.4* 2.8* 2.8* 3.1* 3.6  CL 96* 104 103 102 109  CO2 21* 20* 20* 23 24  GLUCOSE 145* 115* 95 131* 109*  BUN '9 8 7 '$ <5* <5*  CREATININE 0.69 0.62 0.54* 0.61 0.65  CALCIUM 8.0* 7.8* 8.1* 8.2* 7.8*  MG 1.7  --   --  2.3  --   PHOS  --  1.4*  --   --  2.0*   Liver Function Tests: Recent Labs  Lab 08/07/21 1612 08/08/21 0548  AST 62* 57*  ALT 20 17  ALKPHOS 209* 191*  BILITOT 3.9* 4.1*  PROT 7.6 7.4  ALBUMIN 2.6* 2.7*   Recent Labs  Lab 08/07/21 1612  LIPASE 68*   No results for input(s): AMMONIA in the last 168 hours. CBC: Recent Labs  Lab 08/07/21 1612 08/07/21 2225 08/08/21 0548 08/09/21 0452  WBC 11.8*  --   --   --   HGB 7.6* 6.7* 8.6* 8.6*  HCT 21.9*  19.6* 25.1* 25.3*  MCV 89.8  --   --   --   PLT 269  --   --   --    Cardiac Enzymes: No results for input(s): CKTOTAL, CKMB, CKMBINDEX, TROPONINI in the last 168 hours. BNP: Invalid input(s): POCBNP CBG: Recent Labs  Lab 08/09/21 1800 08/09/21 2335 08/10/21 0505 08/10/21 1141 08/10/21 1752  GLUCAP 130* 128* 101* 130* 123*   D-Dimer No results for input(s): DDIMER in the last 72 hours.  Hgb A1c No results for input(s): HGBA1C in the last 72 hours. Lipid Profile No results for input(s): CHOL, HDL, LDLCALC, TRIG, CHOLHDL, LDLDIRECT in the last 72 hours. Thyroid function studies No results for input(s): TSH, T4TOTAL, T3FREE, THYROIDAB in the last 72 hours.  Invalid input(s): FREET3 Anemia work up Recent Labs    08/08/21 1055 08/08/21 1058  VITAMINB12 738  --   FOLATE  --  2.4*  TIBC  --  281  IRON  --  249*   Urinalysis    Component Value Date/Time   COLORURINE AMBER (A) 08/07/2021 1612   APPEARANCEUR CLEAR (A) 08/07/2021 1612   APPEARANCEUR Clear 03/16/2012 1315   LABSPEC 1.009 08/07/2021 1612   LABSPEC 1.013 03/16/2012 1315   PHURINE 8.0 08/07/2021 1612   GLUCOSEU NEGATIVE 08/07/2021 1612   GLUCOSEU 50 mg/dL 03/16/2012 1315   HGBUR NEGATIVE 08/07/2021 1612   BILIRUBINUR NEGATIVE 08/07/2021 1612   BILIRUBINUR Negative 03/16/2012 1315   KETONESUR NEGATIVE 08/07/2021 1612   PROTEINUR NEGATIVE 08/07/2021 1612   NITRITE NEGATIVE 08/07/2021 1612   LEUKOCYTESUR NEGATIVE 08/07/2021 1612   LEUKOCYTESUR Negative 03/16/2012 1315   Sepsis Labs Invalid input(s): PROCALCITONIN,  WBC,  LACTICIDVEN Microbiology Recent Results (from the past 240 hour(s))  Resp Panel by RT-PCR (Flu A&B, Covid) Nasopharyngeal Swab     Status: None   Collection Time: 08/07/21  5:18 PM   Specimen: Nasopharyngeal Swab; Nasopharyngeal(NP) swabs in vial transport medium  Result Value Ref Range Status   SARS Coronavirus 2 by RT PCR NEGATIVE NEGATIVE Final    Comment: (NOTE) SARS-CoV-2  target nucleic acids are NOT DETECTED.  The SARS-CoV-2 RNA is generally detectable in upper respiratory specimens during the acute phase of infection. The lowest concentration of SARS-CoV-2 viral copies this assay can detect is 138 copies/mL. A negative result does not preclude SARS-Cov-2 infection and should not be used as the sole basis for treatment or other patient management decisions. A negative result may occur with  improper specimen collection/handling, submission of specimen other than nasopharyngeal swab, presence of viral mutation(s) within the areas targeted by this assay, and inadequate number of viral copies(<138 copies/mL). A negative result must be combined with clinical observations, patient history, and epidemiological information. The expected result is Negative.  Fact Sheet for Patients:  EntrepreneurPulse.com.au  Fact Sheet for Healthcare Providers:  IncredibleEmployment.be  This test is no t yet approved or cleared by the [place] FDA and  has been authorized for detection and/or diagnosis of SARS-CoV-2 by FDA under an Emergency Use Authorization (EUA). This EUA will remain  in effect (meaning this test can be used) for the duration of the COVID-19 declaration under Section 564(b)(1) of the Act, 21 U.S.C.section 360bbb-3(b)(1), unless the authorization is terminated  or revoked sooner.       Influenza A by PCR NEGATIVE NEGATIVE Final   Influenza B by PCR NEGATIVE NEGATIVE Final    Comment: (NOTE) The Xpert Xpress SARS-CoV-2/FLU/RSV plus assay is intended as an aid in the diagnosis of influenza from Nasopharyngeal swab specimens and should not be used as a sole basis for treatment. Nasal washings and aspirates are unacceptable for Xpert Xpress SARS-CoV-2/FLU/RSV testing.  Fact Sheet for Patients: EntrepreneurPulse.com.au  Fact Sheet for Healthcare  Providers: IncredibleEmployment.be  This test is not yet approved or cleared by the [place] FDA and has been authorized for detection and/or diagnosis of SARS-CoV-2 by FDA under an Emergency Use Authorization (EUA). This EUA will remain in effect (meaning this test can be used) for the duration of the COVID-19 declaration under Section 564(b)(1) of the Act, 21 U.S.C. section 360bbb-3(b)(1), unless the authorization is terminated or revoked.  Performed at [facility], [address], [place], [place] [address]      Time coordinating discharge: Over 30 minutes  SIGNED:   Little Ishikawa, DO [facility] 08/10/2021, 6:02 PM Pager   If 7PM-7AM, please contact night-coverage [email or web address]

## 2021-08-11 LAB — GLUCOSE, CAPILLARY
Glucose-Capillary: 115 mg/dL — ABNORMAL HIGH (ref 70–99)
Glucose-Capillary: 130 mg/dL — ABNORMAL HIGH (ref 70–99)

## 2021-08-11 NOTE — Progress Notes (Signed)
AVS reviewed and pt verbalized understanding of all instructions. Pt verbalized he did need assistance with a ride home. Cab services to be provided. NAD noted at this time or voiced concerns.

## 2021-08-11 NOTE — Discharge Summary (Signed)
Physician Discharge Summary  Ethan Wells [id number] DOB: [birth year] DOA: 08/07/2021  PCP: [facility]  Admit date: 08/07/2021 Discharge date: 08/11/2021  Admitted From: Home Disposition: Home  Recommendations for Outpatient Follow-up:  Follow up with PCP in 1-2 weeks Please obtain BMP/CBC in one week Please follow up with GI as scheduled  Home Health: None Equipment/Devices: None  Discharge Condition: Stable CODE STATUS: Full Diet recommendation: Low-salt low-fat diet  Brief/Interim Summary: 50 year old male past medical history for coronary artery disease, history of V. fib arrest during cardiac catheterization, systolic heart failure, alcoholism polysubstance abuse who presented with nausea, vomiting, decreased p.o. intake and epigastric abdominal pain.  Reported 3 weeks of ongoing symptoms, that prompted him to come to the hospital.    Patient remains discharged - unclear why he is still in the hospital - clinically has resolved back to baseline. Claims he is 'not comfortable leaving until Sunday/Monday' without any further explanation. Suspect social l issues with his living arrangement per previous discussion about him losing his current residence (he mentioned something about his trailer park is getting torn down).    Assessment & Plan:   Acute gastritis ruled out Acute GI bleed ruled out, acute blood loss anemia Cannot rule out marijuana hyperemesis syndrome Hemoglobin stable GI recommending further outpatient work-up given negative imaging and endoscopy here Patient's abdominal pain likely secondary to poor diet, alcohol use, possible marijuana hyperemesis syndrome   Hypokalemia, hyponatremia, hypomagnesemia, hypophosphatemia.  Continues to improve with increased p.o. intake Recommend continued well-rounded diet, recommending diet high in fresh vegetables and avoiding greasy or fried foods   Alcohol, tobacco and THC use/ abuse.  -Lengthy  discussion that these are likely the primary etiology of his abdominal symptoms -Recommend complete discontinuation of alcohol tobacco and marijuana   Elevated liver enzymes and total bilirubin.  CT abdomen consistent with fatty liver, hepatitis panel negative Likely in setting of chronic alcohol use and poor p.o. intake    Discharge Instructions  Discharge Instructions     Call MD for:  persistant nausea and vomiting   Complete by: As directed    Call MD for:  severe uncontrolled pain   Complete by: As directed    Diet - low sodium heart healthy   Complete by: As directed    Increase activity slowly   Complete by: As directed       Allergies as of 08/11/2021       Reactions   Penicillins Hives   Other Rash   chives        Medication List     STOP taking these medications    albuterol 108 (90 Base) MCG/ACT inhaler Commonly known as: VENTOLIN HFA   lisinopril 5 MG tablet Commonly known as: ZESTRIL   nitroGLYCERIN 0.4 MG SL tablet Commonly known as: NITROSTAT       TAKE these medications    aspirin 81 MG chewable tablet Chew 1 tablet (81 mg total) by mouth daily.   Fish Oil 1000 MG Caps Take 2 capsules by mouth daily.   gabapentin 300 MG capsule Commonly known as: NEURONTIN Take 900 mg by mouth at bedtime.   hydrOXYzine 25 MG tablet Commonly known as: ATARAX/VISTARIL Take 25-50 mg by mouth See admin instructions. Take 1 tablet ('25mg'$ ) by mouth 3 times daily as needed for anxiety and take 2 tablets ('50mg'$ ) by mouth at bedtime as needed for sleep or anxiety   multivitamin with minerals Tabs tablet Take 1 tablet by mouth daily.   pantoprazole  40 MG tablet Commonly known as: PROTONIX Take 40 mg by mouth daily.   rosuvastatin 40 MG tablet Commonly known as: CRESTOR Take 40 mg by mouth daily.   sucralfate 1 GM/10ML suspension Commonly known as: CARAFATE Take 10 mLs (1 g total) by mouth 4 (four) times daily -  with meals and at bedtime.         Allergies  Allergen Reactions   Penicillins Hives   Other Rash    chives    Consultations: GI  Procedures/Studies: DG Chest 2 View  Result Date: 08/07/2021 CLINICAL DATA:  Upper abdominal pain for 1 month, nausea vomiting, diaphoresis, short of breath EXAM: CHEST - 2 VIEW COMPARISON:  01/09/2021 FINDINGS: Frontal and lateral views of the chest demonstrate an unremarkable cardiac silhouette. No airspace disease, effusion, or pneumothorax. No acute bony abnormalities. IMPRESSION: 1. No acute intrathoracic process. Electronically Signed   By: Randa Ngo M.D.   On: 08/07/2021 18:04   CT Angio Chest PE W and/or Wo Contrast  Result Date: 08/07/2021 CLINICAL DATA:  Persistent upper abdominal pain for 1 month, nausea and vomiting, diaphoresis, pain with inspiration, back pain EXAM: CT ANGIOGRAPHY CHEST CT ABDOMEN AND PELVIS WITH CONTRAST TECHNIQUE: Multidetector CT imaging of the chest was performed using the standard protocol during bolus administration of intravenous contrast. Multiplanar CT image reconstructions and MIPs were obtained to evaluate the vascular anatomy. Multidetector CT imaging of the abdomen and pelvis was performed using the standard protocol during bolus administration of intravenous contrast. CONTRAST:  165m OMNIPAQUE IOHEXOL 350 MG/ML SOLN COMPARISON:  01/10/2021 FINDINGS: CTA CHEST FINDINGS Cardiovascular: There is technically adequate opacification of the pulmonary vasculature. No filling defects or pulmonary emboli. No evidence of thoracic aortic aneurysm or dissection. Mild atherosclerosis of the aortic arch. The heart is not enlarged. No pericardial effusion. Mild atherosclerosis of the coronary vasculature. Mediastinum/Nodes: No enlarged mediastinal, hilar, or axillary lymph nodes. Thyroid gland, trachea, and esophagus demonstrate no significant findings. Lungs/Pleura: No acute airspace disease, effusion, or pneumothorax. Stable minimal subpleural scarring at the lung  bases. Central airways are patent. Musculoskeletal: No acute or destructive bony lesions. Reconstructed images demonstrate no additional findings. Review of the MIP images confirms the above findings. CT ABDOMEN and PELVIS FINDINGS Hepatobiliary: Liver is enlarged, with heterogeneous fatty infiltration again noted. No focal liver abnormality. The gallbladder is unremarkable. No biliary duct dilation. Pancreas: Unremarkable. No pancreatic ductal dilatation or surrounding inflammatory changes. Spleen: Spleen is mildly enlarged measuring 13.8 x 12.0 x 5.9 cm. No focal parenchymal abnormality. Adrenals/Urinary Tract: Adrenal glands are unremarkable. Kidneys are normal, without renal calculi, focal lesion, or hydronephrosis. Bladder is unremarkable. Stomach/Bowel: No bowel obstruction or ileus. No bowel wall thickening or inflammatory change. Vascular/Lymphatic: Aortic atherosclerosis. No enlarged abdominal or pelvic lymph nodes. Reproductive: Prostate is unremarkable. Other: No free fluid or free gas.  No abdominal wall hernia. Musculoskeletal: No acute or destructive bony lesions. Reconstructed images demonstrate no additional findings. Review of the MIP images confirms the above findings. IMPRESSION: 1. No acute intrathoracic process. 2. No evidence of pulmonary embolus. 3. No evidence of aortic aneurysm or dissection. 4. Hepatic steatosis. 5. Borderline splenomegaly. 6.  Aortic Atherosclerosis (ICD10-I70.0). Electronically Signed   By: MRanda NgoM.D.   On: 08/07/2021 19:36   CT ABDOMEN PELVIS W CONTRAST  Result Date: 08/07/2021 CLINICAL DATA:  Persistent upper abdominal pain for 1 month, nausea and vomiting, diaphoresis, pain with inspiration, back pain EXAM: CT ANGIOGRAPHY CHEST CT ABDOMEN AND PELVIS WITH CONTRAST TECHNIQUE: Multidetector CT imaging  of the chest was performed using the standard protocol during bolus administration of intravenous contrast. Multiplanar CT image reconstructions and MIPs were  obtained to evaluate the vascular anatomy. Multidetector CT imaging of the abdomen and pelvis was performed using the standard protocol during bolus administration of intravenous contrast. CONTRAST:  155m OMNIPAQUE IOHEXOL 350 MG/ML SOLN COMPARISON:  01/10/2021 FINDINGS: CTA CHEST FINDINGS Cardiovascular: There is technically adequate opacification of the pulmonary vasculature. No filling defects or pulmonary emboli. No evidence of thoracic aortic aneurysm or dissection. Mild atherosclerosis of the aortic arch. The heart is not enlarged. No pericardial effusion. Mild atherosclerosis of the coronary vasculature. Mediastinum/Nodes: No enlarged mediastinal, hilar, or axillary lymph nodes. Thyroid gland, trachea, and esophagus demonstrate no significant findings. Lungs/Pleura: No acute airspace disease, effusion, or pneumothorax. Stable minimal subpleural scarring at the lung bases. Central airways are patent. Musculoskeletal: No acute or destructive bony lesions. Reconstructed images demonstrate no additional findings. Review of the MIP images confirms the above findings. CT ABDOMEN and PELVIS FINDINGS Hepatobiliary: Liver is enlarged, with heterogeneous fatty infiltration again noted. No focal liver abnormality. The gallbladder is unremarkable. No biliary duct dilation. Pancreas: Unremarkable. No pancreatic ductal dilatation or surrounding inflammatory changes. Spleen: Spleen is mildly enlarged measuring 13.8 x 12.0 x 5.9 cm. No focal parenchymal abnormality. Adrenals/Urinary Tract: Adrenal glands are unremarkable. Kidneys are normal, without renal calculi, focal lesion, or hydronephrosis. Bladder is unremarkable. Stomach/Bowel: No bowel obstruction or ileus. No bowel wall thickening or inflammatory change. Vascular/Lymphatic: Aortic atherosclerosis. No enlarged abdominal or pelvic lymph nodes. Reproductive: Prostate is unremarkable. Other: No free fluid or free gas.  No abdominal wall hernia. Musculoskeletal: No  acute or destructive bony lesions. Reconstructed images demonstrate no additional findings. Review of the MIP images confirms the above findings. IMPRESSION: 1. No acute intrathoracic process. 2. No evidence of pulmonary embolus. 3. No evidence of aortic aneurysm or dissection. 4. Hepatic steatosis. 5. Borderline splenomegaly. 6.  Aortic Atherosclerosis (ICD10-I70.0). Electronically Signed   By: MRanda NgoM.D.   On: 08/07/2021 19:36   [place] SITE RITE  Result Date: 08/08/2021 [facility], placement could not be confirmed due to current cardiac rhythm.  [place] LIMITED RUQ (LIVER/GB)  Result Date: 08/07/2021 CLINICAL DATA:  Abdominal pain for 1 month, nausea and vomiting, diaphoresis, epigastric pain EXAM: ULTRASOUND ABDOMEN LIMITED RIGHT UPPER QUADRANT COMPARISON:  08/07/2021 FINDINGS: Gallbladder: No gallstones or wall thickening visualized. No sonographic Murphy sign noted by sonographer. Common bile duct: Diameter: 2 mm Liver: There is diffuse increased liver echotexture consistent with hepatic steatosis seen on preceding CT. Evaluation is limited by body habitus. Portal vein is patent on color Doppler imaging with normal direction of blood flow towards the liver. Other: None. IMPRESSION: 1. Limited study due to body habitus. 2. Hepatic steatosis. Electronically Signed   By: MRanda NgoM.D.   On: 08/07/2021 20:21     Subjective: No acute issues or events overnight, abdominal pain improving tolerating p.o. without difficulty denies nausea vomiting diarrhea constipation headache fevers or chills   Discharge Exam: Vitals:   08/10/21 2114 08/11/21 0431  BP: 136/82 124/81  Pulse: 92 90  Resp: 20 20  Temp: 97.8 F (36.6 C) 98 F (36.7 C)  SpO2: 98% 98%   Vitals:   08/10/21 0757 08/10/21 1556 08/10/21 2114 08/11/21 0431  BP: 113/74 114/74 136/82 124/81  Pulse: 85 79 92 90  Resp: '18 18 20 20  '$ Temp: 98.4 F (36.9 C) 98.4 F (36.9 C) 97.8 F (  36.6 C) 98 F  (36.7 C)  TempSrc: Oral Oral Oral Oral  SpO2: 100% 97% 98% 98%  Weight:      Height:        General: Pt is alert, awake, not in acute distress Cardiovascular: RRR, S1/S2 +, no rubs, no gallops Respiratory: CTA bilaterally, no wheezing, no rhonchi Abdominal: Soft, NT, ND, bowel sounds + Extremities: no edema, no cyanosis    The results of significant diagnostics from this hospitalization (including imaging, microbiology, ancillary and laboratory) are listed below for reference.     Microbiology: Recent Results (from the past 240 hour(s))  Resp Panel by RT-PCR (Flu A&B, Covid) Nasopharyngeal Swab     Status: None   Collection Time: 08/07/21  5:18 PM   Specimen: Nasopharyngeal Swab; Nasopharyngeal(NP) swabs in vial transport medium  Result Value Ref Range Status   SARS Coronavirus 2 by RT PCR NEGATIVE NEGATIVE Final    Comment: (NOTE) SARS-CoV-2 target nucleic acids are NOT DETECTED.  The SARS-CoV-2 RNA is generally detectable in upper respiratory specimens during the acute phase of infection. The lowest concentration of SARS-CoV-2 viral copies this assay can detect is 138 copies/mL. A negative result does not preclude SARS-Cov-2 infection and should not be used as the sole basis for treatment or other patient management decisions. A negative result may occur with  improper specimen collection/handling, submission of specimen other than nasopharyngeal swab, presence of viral mutation(s) within the areas targeted by this assay, and inadequate number of viral copies(<138 copies/mL). A negative result must be combined with clinical observations, patient history, and epidemiological information. The expected result is Negative.  Fact Sheet for Patients:  EntrepreneurPulse.com.au  Fact Sheet for Healthcare Providers:  IncredibleEmployment.be  This test is no t yet approved or cleared by the [place] FDA and  has been authorized for  detection and/or diagnosis of SARS-CoV-2 by FDA under an Emergency Use Authorization (EUA). This EUA will remain  in effect (meaning this test can be used) for the duration of the COVID-19 declaration under Section 564(b)(1) of the Act, 21 U.S.C.section 360bbb-3(b)(1), unless the authorization is terminated  or revoked sooner.       Influenza A by PCR NEGATIVE NEGATIVE Final   Influenza B by PCR NEGATIVE NEGATIVE Final    Comment: (NOTE) The Xpert Xpress SARS-CoV-2/FLU/RSV plus assay is intended as an aid in the diagnosis of influenza from Nasopharyngeal swab specimens and should not be used as a sole basis for treatment. Nasal washings and aspirates are unacceptable for Xpert Xpress SARS-CoV-2/FLU/RSV testing.  Fact Sheet for Patients: EntrepreneurPulse.com.au  Fact Sheet for Healthcare Providers: IncredibleEmployment.be  This test is not yet approved or cleared by the [place] FDA and has been authorized for detection and/or diagnosis of SARS-CoV-2 by FDA under an Emergency Use Authorization (EUA). This EUA will remain in effect (meaning this test can be used) for the duration of the COVID-19 declaration under Section 564(b)(1) of the Act, 21 U.S.C. section 360bbb-3(b)(1), unless the authorization is terminated or revoked.  Performed at [facility], [address] [place], [place] [address]      Labs: BNP (last 3 results) No results for input(s): BNP in the last 8760 hours. Basic Metabolic Panel: Recent Labs  Lab 08/07/21 1612 08/07/21 2225 08/08/21 0548 08/09/21 0452 08/10/21 0429  NA 128* 132* 134* 134* 137  K 2.4* 2.8* 2.8* 3.1* 3.6  CL 96* 104 103 102 109  CO2 21* 20* 20* 23 24  GLUCOSE 145* 115* 95 131* 109*  BUN '9 8 7 '$ <5* <5*  CREATININE 0.69 0.62 0.54* 0.61 0.65  CALCIUM 8.0* 7.8* 8.1* 8.2* 7.8*  MG 1.7  --   --  2.3  --   PHOS  --  1.4*  --   --  2.0*    Liver Function Tests: Recent Labs  Lab  08/07/21 1612 08/08/21 0548  AST 62* 57*  ALT 20 17  ALKPHOS 209* 191*  BILITOT 3.9* 4.1*  PROT 7.6 7.4  ALBUMIN 2.6* 2.7*    Recent Labs  Lab 08/07/21 1612  LIPASE 68*    No results for input(s): AMMONIA in the last 168 hours. CBC: Recent Labs  Lab 08/07/21 1612 08/07/21 2225 08/08/21 0548 08/09/21 0452  WBC 11.8*  --   --   --   HGB 7.6* 6.7* 8.6* 8.6*  HCT 21.9* 19.6* 25.1* 25.3*  MCV 89.8  --   --   --   PLT 269  --   --   --     Cardiac Enzymes: No results for input(s): CKTOTAL, CKMB, CKMBINDEX, TROPONINI in the last 168 hours. BNP: Invalid input(s): POCBNP CBG: Recent Labs  Lab 08/10/21 0505 08/10/21 1141 08/10/21 1752 08/11/21 0011 08/11/21 0536  GLUCAP 101* 130* 123* 130* 115*    D-Dimer No results for input(s): DDIMER in the last 72 hours.  Hgb A1c No results for input(s): HGBA1C in the last 72 hours. Lipid Profile No results for input(s): CHOL, HDL, LDLCALC, TRIG, CHOLHDL, LDLDIRECT in the last 72 hours. Thyroid function studies No results for input(s): TSH, T4TOTAL, T3FREE, THYROIDAB in the last 72 hours.  Invalid input(s): FREET3 Anemia work up Recent Labs    08/08/21 1055 08/08/21 1058  VITAMINB12 738  --   FOLATE  --  2.4*  TIBC  --  281  IRON  --  249*    Urinalysis    Component Value Date/Time   COLORURINE AMBER (A) 08/07/2021 1612   APPEARANCEUR CLEAR (A) 08/07/2021 1612   APPEARANCEUR Clear 03/16/2012 1315   LABSPEC 1.009 08/07/2021 1612   LABSPEC 1.013 03/16/2012 1315   PHURINE 8.0 08/07/2021 1612   GLUCOSEU NEGATIVE 08/07/2021 1612   GLUCOSEU 50 mg/dL 03/16/2012 1315   HGBUR NEGATIVE 08/07/2021 1612   BILIRUBINUR NEGATIVE 08/07/2021 1612   BILIRUBINUR Negative 03/16/2012 1315   KETONESUR NEGATIVE 08/07/2021 1612   PROTEINUR NEGATIVE 08/07/2021 1612   NITRITE NEGATIVE 08/07/2021 1612   LEUKOCYTESUR NEGATIVE 08/07/2021 1612   LEUKOCYTESUR Negative 03/16/2012 1315   Sepsis Labs Invalid input(s): PROCALCITONIN,   WBC,  LACTICIDVEN Microbiology Recent Results (from the past 240 hour(s))  Resp Panel by RT-PCR (Flu A&B, Covid) Nasopharyngeal Swab     Status: None   Collection Time: 08/07/21  5:18 PM   Specimen: Nasopharyngeal Swab; Nasopharyngeal(NP) swabs in vial transport medium  Result Value Ref Range Status   SARS Coronavirus 2 by RT PCR NEGATIVE NEGATIVE Final    Comment: (NOTE) SARS-CoV-2 target nucleic acids are NOT DETECTED.  The SARS-CoV-2 RNA is generally detectable in upper respiratory specimens during the acute phase of infection. The lowest concentration of SARS-CoV-2 viral copies this assay can detect is 138 copies/mL. A negative result does not preclude SARS-Cov-2 infection and should not be used as the sole basis for treatment or other patient management decisions. A negative result may occur with  improper specimen collection/handling, submission of specimen other than nasopharyngeal swab, presence of viral mutation(s) within the areas targeted by this assay, and inadequate number of viral copies(<138 copies/mL). A negative  result must be combined with clinical observations, patient history, and epidemiological information. The expected result is Negative.  Fact Sheet for Patients:  EntrepreneurPulse.com.au  Fact Sheet for Healthcare Providers:  IncredibleEmployment.be  This test is no t yet approved or cleared by the [place] FDA and  has been authorized for detection and/or diagnosis of SARS-CoV-2 by FDA under an Emergency Use Authorization (EUA). This EUA will remain  in effect (meaning this test can be used) for the duration of the COVID-19 declaration under Section 564(b)(1) of the Act, 21 U.S.C.section 360bbb-3(b)(1), unless the authorization is terminated  or revoked sooner.       Influenza A by PCR NEGATIVE NEGATIVE Final   Influenza B by PCR NEGATIVE NEGATIVE Final    Comment: (NOTE) The Xpert Xpress SARS-CoV-2/FLU/RSV  plus assay is intended as an aid in the diagnosis of influenza from Nasopharyngeal swab specimens and should not be used as a sole basis for treatment. Nasal washings and aspirates are unacceptable for Xpert Xpress SARS-CoV-2/FLU/RSV testing.  Fact Sheet for Patients: EntrepreneurPulse.com.au  Fact Sheet for Healthcare Providers: IncredibleEmployment.be  This test is not yet approved or cleared by the [place] FDA and has been authorized for detection and/or diagnosis of SARS-CoV-2 by FDA under an Emergency Use Authorization (EUA). This EUA will remain in effect (meaning this test can be used) for the duration of the COVID-19 declaration under Section 564(b)(1) of the Act, 21 U.S.C. section 360bbb-3(b)(1), unless the authorization is terminated or revoked.  Performed at [facility], [address], [place], [place] [address]      Time coordinating discharge: Over 30 minutes  SIGNED:   Little Ishikawa, DO [facility] 08/11/2021, 7:50 AM Pager   If 7PM-7AM, please contact night-coverage [email or web address]

## 2021-09-05 ENCOUNTER — Other Ambulatory Visit: Payer: Self-pay

## 2021-09-05 ENCOUNTER — Emergency Department
Admission: EM | Admit: 2021-09-05 | Discharge: 2021-09-05 | Disposition: A | Discharge status: Home / Self Care | Payer: Medicare HMO | Attending: Emergency Medicine | Admitting: Emergency Medicine

## 2021-09-05 DIAGNOSIS — R1084 Generalized abdominal pain: Secondary | ICD-10-CM | POA: Diagnosis not present

## 2021-09-05 DIAGNOSIS — Z7982 Long term (current) use of aspirin: Secondary | ICD-10-CM | POA: Insufficient documentation

## 2021-09-05 DIAGNOSIS — R52 Pain, unspecified: Secondary | ICD-10-CM | POA: Diagnosis not present

## 2021-09-05 DIAGNOSIS — F17210 Nicotine dependence, cigarettes, uncomplicated: Secondary | ICD-10-CM | POA: Diagnosis not present

## 2021-09-05 DIAGNOSIS — I2510 Atherosclerotic heart disease of native coronary artery without angina pectoris: Secondary | ICD-10-CM | POA: Insufficient documentation

## 2021-09-05 DIAGNOSIS — R630 Anorexia: Secondary | ICD-10-CM | POA: Diagnosis not present

## 2021-09-05 DIAGNOSIS — K6289 Other specified diseases of anus and rectum: Secondary | ICD-10-CM | POA: Diagnosis not present

## 2021-09-05 DIAGNOSIS — I110 Hypertensive heart disease with heart failure: Secondary | ICD-10-CM | POA: Insufficient documentation

## 2021-09-05 DIAGNOSIS — Z955 Presence of coronary angioplasty implant and graft: Secondary | ICD-10-CM | POA: Diagnosis not present

## 2021-09-05 DIAGNOSIS — R109 Unspecified abdominal pain: Secondary | ICD-10-CM

## 2021-09-05 DIAGNOSIS — Z79899 Other long term (current) drug therapy: Secondary | ICD-10-CM | POA: Insufficient documentation

## 2021-09-05 DIAGNOSIS — K219 Gastro-esophageal reflux disease without esophagitis: Secondary | ICD-10-CM | POA: Diagnosis not present

## 2021-09-05 DIAGNOSIS — D72829 Elevated white blood cell count, unspecified: Secondary | ICD-10-CM | POA: Diagnosis not present

## 2021-09-05 DIAGNOSIS — R110 Nausea: Secondary | ICD-10-CM | POA: Insufficient documentation

## 2021-09-05 DIAGNOSIS — G8929 Other chronic pain: Secondary | ICD-10-CM | POA: Insufficient documentation

## 2021-09-05 DIAGNOSIS — I5022 Chronic systolic (congestive) heart failure: Secondary | ICD-10-CM | POA: Diagnosis not present

## 2021-09-05 DIAGNOSIS — R58 Hemorrhage, not elsewhere classified: Secondary | ICD-10-CM | POA: Diagnosis not present

## 2021-09-05 LAB — URINALYSIS, COMPLETE (UACMP) WITH MICROSCOPIC
Bacteria, UA: NONE SEEN
Bilirubin Urine: NEGATIVE
Glucose, UA: NEGATIVE mg/dL
Ketones, ur: NEGATIVE mg/dL
Leukocytes,Ua: NEGATIVE
Nitrite: NEGATIVE
Protein, ur: NEGATIVE mg/dL
Specific Gravity, Urine: 1.001 — ABNORMAL LOW (ref 1.005–1.030)
Squamous Epithelial / HPF: NONE SEEN (ref 0–5)
pH: 7 (ref 5.0–8.0)

## 2021-09-05 LAB — COMPREHENSIVE METABOLIC PANEL
ALT: 36 U/L (ref 0–44)
AST: 85 U/L — ABNORMAL HIGH (ref 15–41)
Albumin: 2.8 g/dL — ABNORMAL LOW (ref 3.5–5.0)
Alkaline Phosphatase: 222 U/L — ABNORMAL HIGH (ref 38–126)
Anion gap: 11 (ref 5–15)
BUN: <5 mg/dL — ABNORMAL LOW (ref 6–20)
CO2: 21 mmol/L — ABNORMAL LOW (ref 22–32)
Calcium: 8.6 mg/dL — ABNORMAL LOW (ref 8.9–10.3)
Chloride: 104 mmol/L (ref 98–111)
Creatinine, Ser: 0.65 mg/dL (ref 0.61–1.24)
GFR, Estimated: >60 mL/min (ref >60)
Glucose, Bld: 139 mg/dL — ABNORMAL HIGH (ref 70–99)
Potassium: 3.3 mmol/L — ABNORMAL LOW (ref 3.5–5.1)
Sodium: 136 mmol/L (ref 135–145)
Total Bilirubin: 1.2 mg/dL (ref 0.3–1.2)
Total Protein: 7.7 g/dL (ref 6.5–8.1)

## 2021-09-05 LAB — LIPASE, BLOOD: Lipase: 42 U/L (ref 11–51)

## 2021-09-05 LAB — CBC
HCT: 34.5 % — ABNORMAL LOW (ref 39.0–52.0)
Hemoglobin: 11.7 g/dL — ABNORMAL LOW (ref 13.0–17.0)
MCH: 31.9 pg (ref 26.0–34.0)
MCHC: 33.9 g/dL (ref 30.0–36.0)
MCV: 94 fL (ref 80.0–100.0)
Platelets: 225 10*3/uL (ref 150–400)
RBC: 3.67 MIL/uL — ABNORMAL LOW (ref 4.22–5.81)
RDW: 17.2 % — ABNORMAL HIGH (ref 11.5–15.5)
WBC: 10.7 10*3/uL — ABNORMAL HIGH (ref 4.0–10.5)
nRBC: 0 % (ref 0.0–0.2)

## 2021-09-05 MED ORDER — KETOROLAC TROMETHAMINE 30 MG/ML IJ SOLN
30.0000 mg | Freq: Once | INTRAMUSCULAR | Status: AC
  Administered 2021-09-05: 30 mg via INTRAMUSCULAR
  Filled 2021-09-05: qty 1

## 2021-09-05 MED ORDER — DROPERIDOL 2.5 MG/ML IJ SOLN
2.5000 mg | Freq: Once | INTRAMUSCULAR | Status: AC
  Administered 2021-09-05: 2.5 mg via INTRAMUSCULAR
  Filled 2021-09-05: qty 2

## 2021-09-05 MED ORDER — ONDANSETRON 4 MG PO TBDP: 4.0000 mg | ORAL_TABLET | Freq: Three times a day (TID) | ORAL | 0 refills | Status: DC | PRN

## 2021-09-05 NOTE — Discharge Instructions (Addendum)
Please take Tylenol and ibuprofen/Advil for your pain.  It is safe to take them together, or to alternate them every few hours.  Take up to 1000mg of Tylenol at a time, up to 4 times per day.  Do not take more than 4000 mg of Tylenol in 24 hours.  For ibuprofen, take 400-600 mg, 4-5 times per day. ° ° °

## 2021-09-05 NOTE — ED Provider Notes (Signed)
[facility] Emergency Department Provider Note ____________________________________________   Event Date/Time   First MD Initiated Contact with Patient 09/05/21 1859     (approximate)  I have reviewed the triage vital signs and the nursing notes.  HISTORY  Chief Complaint Abdominal Pain   HPI Ethan Wells is a 50 y.o. malewho presents to the ED for evaluation of abdominal pain.  Chart review indicates polysubstance abuse, CAD, HTN, HLD. Recent medical admission 8/23-8/27 due to abdominal pain and electrolyte derangements.  EGD/colonoscopy with internal hemorrhoids but otherwise unremarkable.  Largely benign work-up, and symptoms attributed to poor diet, polysubstance abuse, cannabis hyperemesis and poor social situation.  He apparently was refusing discharge and required law enforcement assistance.  Patient returns to the ED today for evaluation of abdominal pain for the past 4 weeks.  He reports diffuse abdominal pain, nausea and poor p.o. intake.  He reports perianal pain with defecation.  Denies constipation, diarrhea, fever, emesis, chest pain, syncope, dysuria  Past Medical History:  Diagnosis Date   CAD (coronary artery disease)    a. 01/2011 Anterior STEMI/Cath/PCI: LM nl, LAD 100d (3.5x68mm Vision BMS placed), LCX 65m, RI 50, RCA min irregs, EF 40% w/ apical, inferoapical HK.   Cardiac arrest - ventricular fibrillation    a. In setting of STEMI 01/2011   Chronic pain    Cocaine abuse ([place])    Depression with anxiety    ETOH abuse    a. 6-12 beers/day   GERD (gastroesophageal reflux disease)    Hemorrhoids    Hyperlipidemia    Hypertension    Ischemic cardiomyopathy    a. 06/2011 Echo: EF 45-50%, No rwma   Marijuana abuse    Migraines    Tobacco abuse    a. 1/2 ppd x 26 yrs    Patient Active Problem List   Diagnosis Date Noted   Non-intractable vomiting    Epigastric abdominal pain    Polyp of ascending colon    Acute on chronic  blood loss anemia 08/07/2021   Acute gastritis    Hyperbilirubinemia    Elevated troponin    Notalgia    NSTEMI (non-ST elevated myocardial infarction) ([place]) 12/19/2020   History of ST elevation myocardial infarction (STEMI) 12/19/2020   Alcohol use disorder, moderate, dependence ([place]) 12/19/2020   Depression with anxiety 10/05/2020   Esophageal reflux 10/05/2020   Headache syndrome 10/05/2020   Hyperlipidemia 10/05/2020   Hyponatremia 06/04/2020   Hypomagnesemia 83/15/1761   Chronic systolic CHF (congestive heart failure) ([place]) 06/04/2020   Leukocytosis 06/04/2020   Abdominal pain 06/04/2020   Hypokalemia 06/04/2020   Ischemic cardiomyopathy    Polysubstance abuse ([place])    Chest pain 07/12/2019   Abdominal pain, RUQ 05/25/2018   Chest pain, mid sternal 11/17/2012   Cocaine abuse ([place])    Marijuana abuse    TOBACCO ABUSE 02/26/2011   HYPERTENSION, BENIGN 02/26/2011   CAD (coronary artery disease) 02/26/2011   Other specified forms of chronic ischemic heart disease 02/26/2011    Past Surgical History:  Procedure Laterality Date   COLONOSCOPY  08/09/2021   Procedure: COLONOSCOPY;  Surgeon: Lucilla Lame, MD;  Location: [facility] ENDOSCOPY;  Service: Endoscopy;;   CORONARY ANGIOPLASTY WITH STENT PLACEMENT     ESOPHAGOGASTRODUODENOSCOPY (EGD) WITH PROPOFOL N/A 03/01/2021   Procedure: ESOPHAGOGASTRODUODENOSCOPY (EGD) WITH PROPOFOL;  Surgeon: Lin Landsman, MD;  Location: [place];  Service: Endoscopy;  Laterality: N/A;   ESOPHAGOGASTRODUODENOSCOPY (EGD) WITH PROPOFOL N/A 08/09/2021   Procedure: ESOPHAGOGASTRODUODENOSCOPY (EGD) WITH  PROPOFOL;  Surgeon: Lucilla Lame, MD;  Location: [facility] ENDOSCOPY;  Service: Endoscopy;  Laterality: N/A;    Prior to Admission medications   Medication Sig Start Date End Date Taking? Authorizing Provider  ondansetron (ZOFRAN ODT) 4 MG disintegrating tablet Take 1 tablet (4 mg total) by mouth every 8 (eight) hours as needed for nausea or  vomiting. 09/05/21  Yes Vladimir Crofts, MD  aspirin 81 MG chewable tablet Chew 1 tablet (81 mg total) by mouth daily. 06/07/20   Fritzi Mandes, MD  gabapentin (NEURONTIN) 300 MG capsule Take 900 mg by mouth at bedtime.    [provider]  hydrOXYzine (ATARAX/VISTARIL) 25 MG tablet Take 25-50 mg by mouth See admin instructions. Take 1 tablet (25mg ) by mouth 3 times daily as needed for anxiety and take 2 tablets (50mg ) by mouth at bedtime as needed for sleep or anxiety    [provider]  Multiple Vitamin (MULTIVITAMIN WITH MINERALS) TABS tablet Take 1 tablet by mouth daily. 08/10/21   Little Ishikawa, MD  Omega-3 Fatty Acids (FISH OIL) 1000 MG CAPS Take 2 capsules by mouth daily.    [provider]  pantoprazole (PROTONIX) 40 MG tablet Take 40 mg by mouth daily.    [provider]  rosuvastatin (CRESTOR) 40 MG tablet Take 40 mg by mouth daily.    [provider]  sucralfate (CARAFATE) 1 GM/10ML suspension Take 10 mLs (1 g total) by mouth 4 (four) times daily -  with meals and at bedtime. 08/10/21   Little Ishikawa, MD    Allergies Penicillins and Other  Family History  Problem Relation Age of Onset   Coronary artery disease Mother        alive   Other Other        2 sisters alive and well   Alcohol abuse Father        died @ 53   Other Paternal Grandmother        PACER    Social History Social History   Tobacco Use   Smoking status: Every Day    Packs/day: 0.50    Years: 41.00    Pack years: 20.50    Types: Cigarettes   Smokeless tobacco: Never   Tobacco comments:    started age 72  Vaping Use   Vaping Use: Never used  Substance Use Topics   Alcohol use: Yes    Comment: at least a 6 pack of beer daily and often a 12 pack   Drug use: Not Currently    Types: Cocaine, Marijuana    Comment: (02/22/21 - pt reports none last 6 mos) cocaine used several weeks ago; marijuana today    Review of Systems  Constitutional: No  fever/chills Eyes: No visual changes. ENT: No sore throat. Cardiovascular: Denies chest pain. Respiratory: Denies shortness of breath. Gastrointestinal: no vomiting.  No diarrhea.  No constipation. Positive for abdominal and perirectal pain, nausea without vomiting. Genitourinary: Negative for dysuria. Musculoskeletal: Negative for back pain. Skin: Negative for rash. Neurological: Negative for headaches, focal weakness or numbness.  ____________________________________________   PHYSICAL EXAM:  VITAL SIGNS: Vitals:   09/05/21 1717  BP: 104/89  Pulse: (!) 102  Resp: 19  Temp: 98.3 F (36.8 C)  SpO2: 97%    Constitutional: Alert and oriented. Well appearing and in no acute distress.  Obese.  Conversational full sentences. Eyes: Conjunctivae are normal. PERRL. EOMI. Head: Atraumatic. Nose: No congestion/rhinnorhea. Mouth/Throat: Mucous membranes are moist.  Oropharynx non-erythematous. Neck: No stridor.  No cervical spine tenderness to palpation. Cardiovascular: Normal rate, regular rhythm. Grossly normal heart sounds.  Good peripheral circulation. Respiratory: Normal respiratory effort.  No retractions. Lungs CTAB. Gastrointestinal: Soft , nondistended, nontender to palpation. No CVA tenderness. Chaperoned rectal exam demonstrates normal-appearing external anatomy without evidence of anal fissures, external hemorrhoids or skin tags. Lubricated DRE is well-tolerated with brown stool that is Hemoccult negative. Musculoskeletal: No lower extremity tenderness nor edema.  No joint effusions. No signs of acute trauma. Neurologic:  Normal speech and language. No gross focal neurologic deficits are appreciated. No gait instability noted. Skin:  Skin is warm, dry and intact. No rash noted. Psychiatric: Mood and affect are normal. Speech and behavior are normal. ____________________________________________   LABS (all labs ordered are listed, but only abnormal results are  displayed)  Labs Reviewed  COMPREHENSIVE METABOLIC PANEL - Abnormal; Notable for the following components:      Result Value   Potassium 3.3 (*)    CO2 21 (*)    Glucose, Bld 139 (*)    BUN <5 (*)    Calcium 8.6 (*)    Albumin 2.8 (*)    AST 85 (*)    Alkaline Phosphatase 222 (*)    All other components within normal limits  CBC - Abnormal; Notable for the following components:   WBC 10.7 (*)    RBC 3.67 (*)    Hemoglobin 11.7 (*)    HCT 34.5 (*)    RDW 17.2 (*)    All other components within normal limits  LIPASE, BLOOD  URINALYSIS, COMPLETE (UACMP) WITH MICROSCOPIC   ____________________________________________  12 Lead EKG   ____________________________________________  RADIOLOGY  ED MD interpretation:   Official radiology report(s): No results found.  ____________________________________________   PROCEDURES and INTERVENTIONS  Procedure(s) performed (including Critical Care):  Procedures  Medications  ketorolac (TORADOL) 30 MG/ML injection 30 mg (has no administration in time range)  droperidol (INAPSINE) 2.5 MG/ML injection 2.5 mg (2.5 mg Intramuscular Given 09/05/21 1959)    ____________________________________________   MDM / ED COURSE   50 year old male presents to the ED with worsening chronic abdominal pain, without evidence of significant acute pathology, and amenable to continued outpatient management.  Blood work with mild electrolyte derangements and minimal leukocytosis.  I see no evidence of acute infectious pathology.  He has a very benign abdominal examination and perirectal examination.  No evidence of hemorrhoids, anal fissures, no tenderness to suggest diverticulitis, appendicitis or cholecystitis.  No signs of pancreatitis.  We will provide symptomatic measures and anticipate outpatient management.  We will provide outpatient follow-up with GI considering the chronicity of his symptoms and return precautions with the ED.   Clinical  Course as of 09/05/21 2050  Wed Sep 05, 2021  2049 Reassessed.  Patient tolerating p.o. intake.  Many requests, including blankets, food, lights, TV.  Continues to have a benign abdominal examination. [DS]    Clinical Course User Index [DS] Vladimir Crofts, MD    ____________________________________________   FINAL CLINICAL IMPRESSION(S) / ED DIAGNOSES  Final diagnoses:  Generalized abdominal pain     ED Discharge Orders          Ordered    ondansetron (ZOFRAN ODT) 4 MG disintegrating tablet  Every 8 hours PRN        09/05/21 2050             Eugine Bubb   Note:  This document was prepared using Dragon voice recognition software and may include unintentional dictation errors.  Vladimir Crofts, MD 09/05/21 2055

## 2021-09-05 NOTE — ED Triage Notes (Signed)
Pt comes into the ED via EMS from home with abd pain, painful defecation, states he has already been seen for the same x2  HR110 120/78 97%RA

## 2021-12-21 ENCOUNTER — Other Ambulatory Visit: Payer: Self-pay

## 2021-12-21 ENCOUNTER — Emergency Department: Payer: Medicare HMO

## 2021-12-21 ENCOUNTER — Encounter: Payer: Self-pay | Admitting: Radiology

## 2021-12-21 DIAGNOSIS — R1084 Generalized abdominal pain: Secondary | ICD-10-CM | POA: Insufficient documentation

## 2021-12-21 DIAGNOSIS — U071 COVID-19: Secondary | ICD-10-CM | POA: Insufficient documentation

## 2021-12-21 DIAGNOSIS — Z5321 Procedure and treatment not carried out due to patient leaving prior to being seen by health care provider: Secondary | ICD-10-CM | POA: Insufficient documentation

## 2021-12-21 DIAGNOSIS — R0602 Shortness of breath: Secondary | ICD-10-CM | POA: Diagnosis not present

## 2021-12-21 DIAGNOSIS — R059 Cough, unspecified: Secondary | ICD-10-CM | POA: Diagnosis not present

## 2021-12-21 LAB — CBC
HCT: 43 % (ref 39.0–52.0)
Hemoglobin: 15.2 g/dL (ref 13.0–17.0)
MCH: 35 pg — ABNORMAL HIGH (ref 26.0–34.0)
MCHC: 35.3 g/dL (ref 30.0–36.0)
MCV: 99.1 fL (ref 80.0–100.0)
Platelets: 201 10*3/uL (ref 150–400)
RBC: 4.34 MIL/uL (ref 4.22–5.81)
RDW: 13.8 % (ref 11.5–15.5)
WBC: 8.2 10*3/uL (ref 4.0–10.5)
nRBC: 0 % (ref 0.0–0.2)

## 2021-12-21 IMAGING — CR DG CHEST 2V
2 series · 2 of 2 positions shown · non-contrast
Comparison: [DATE]

CLINICAL DATA: Shortness of breath and cough

EXAM:
CHEST - 2 VIEW

[chest pa]
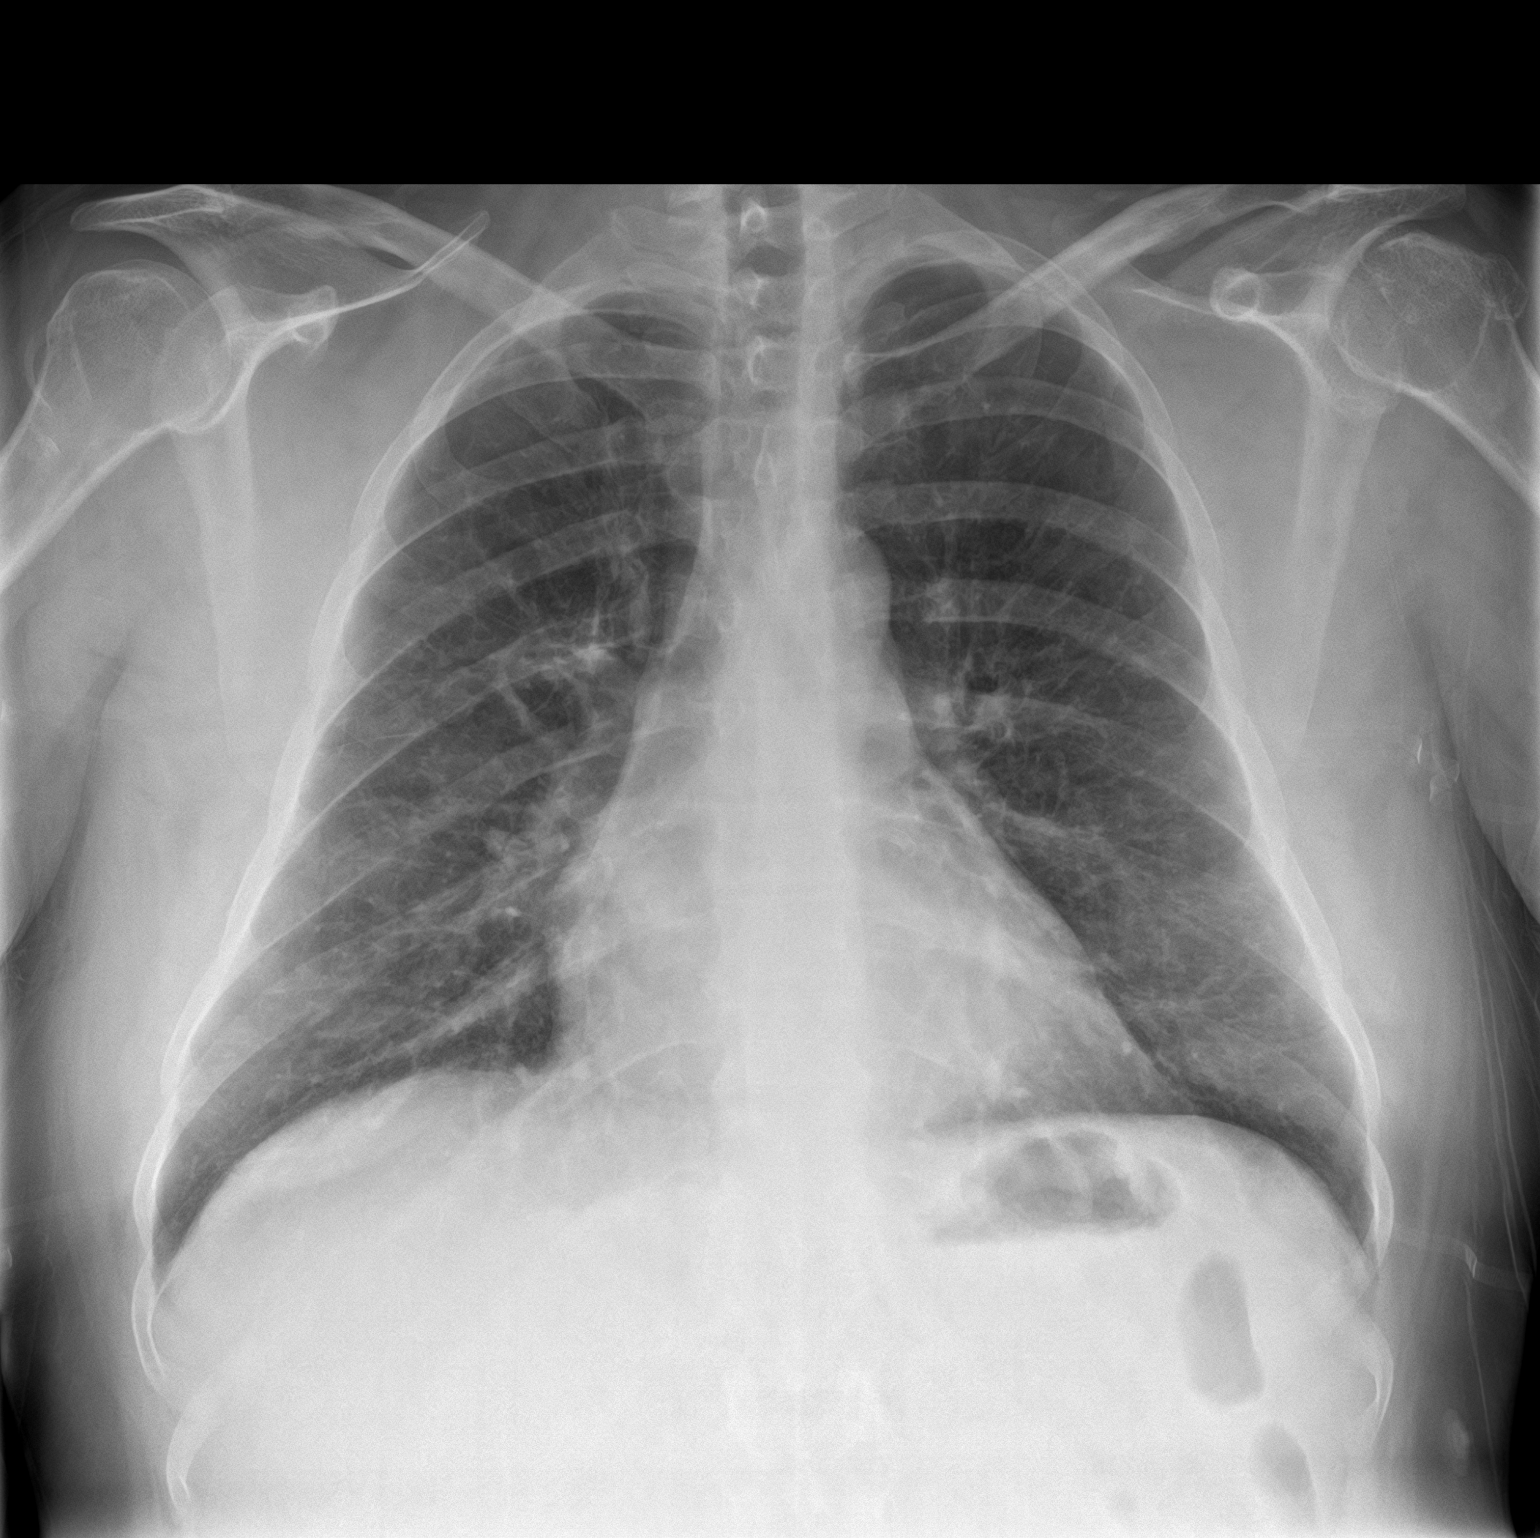

[chest lat]
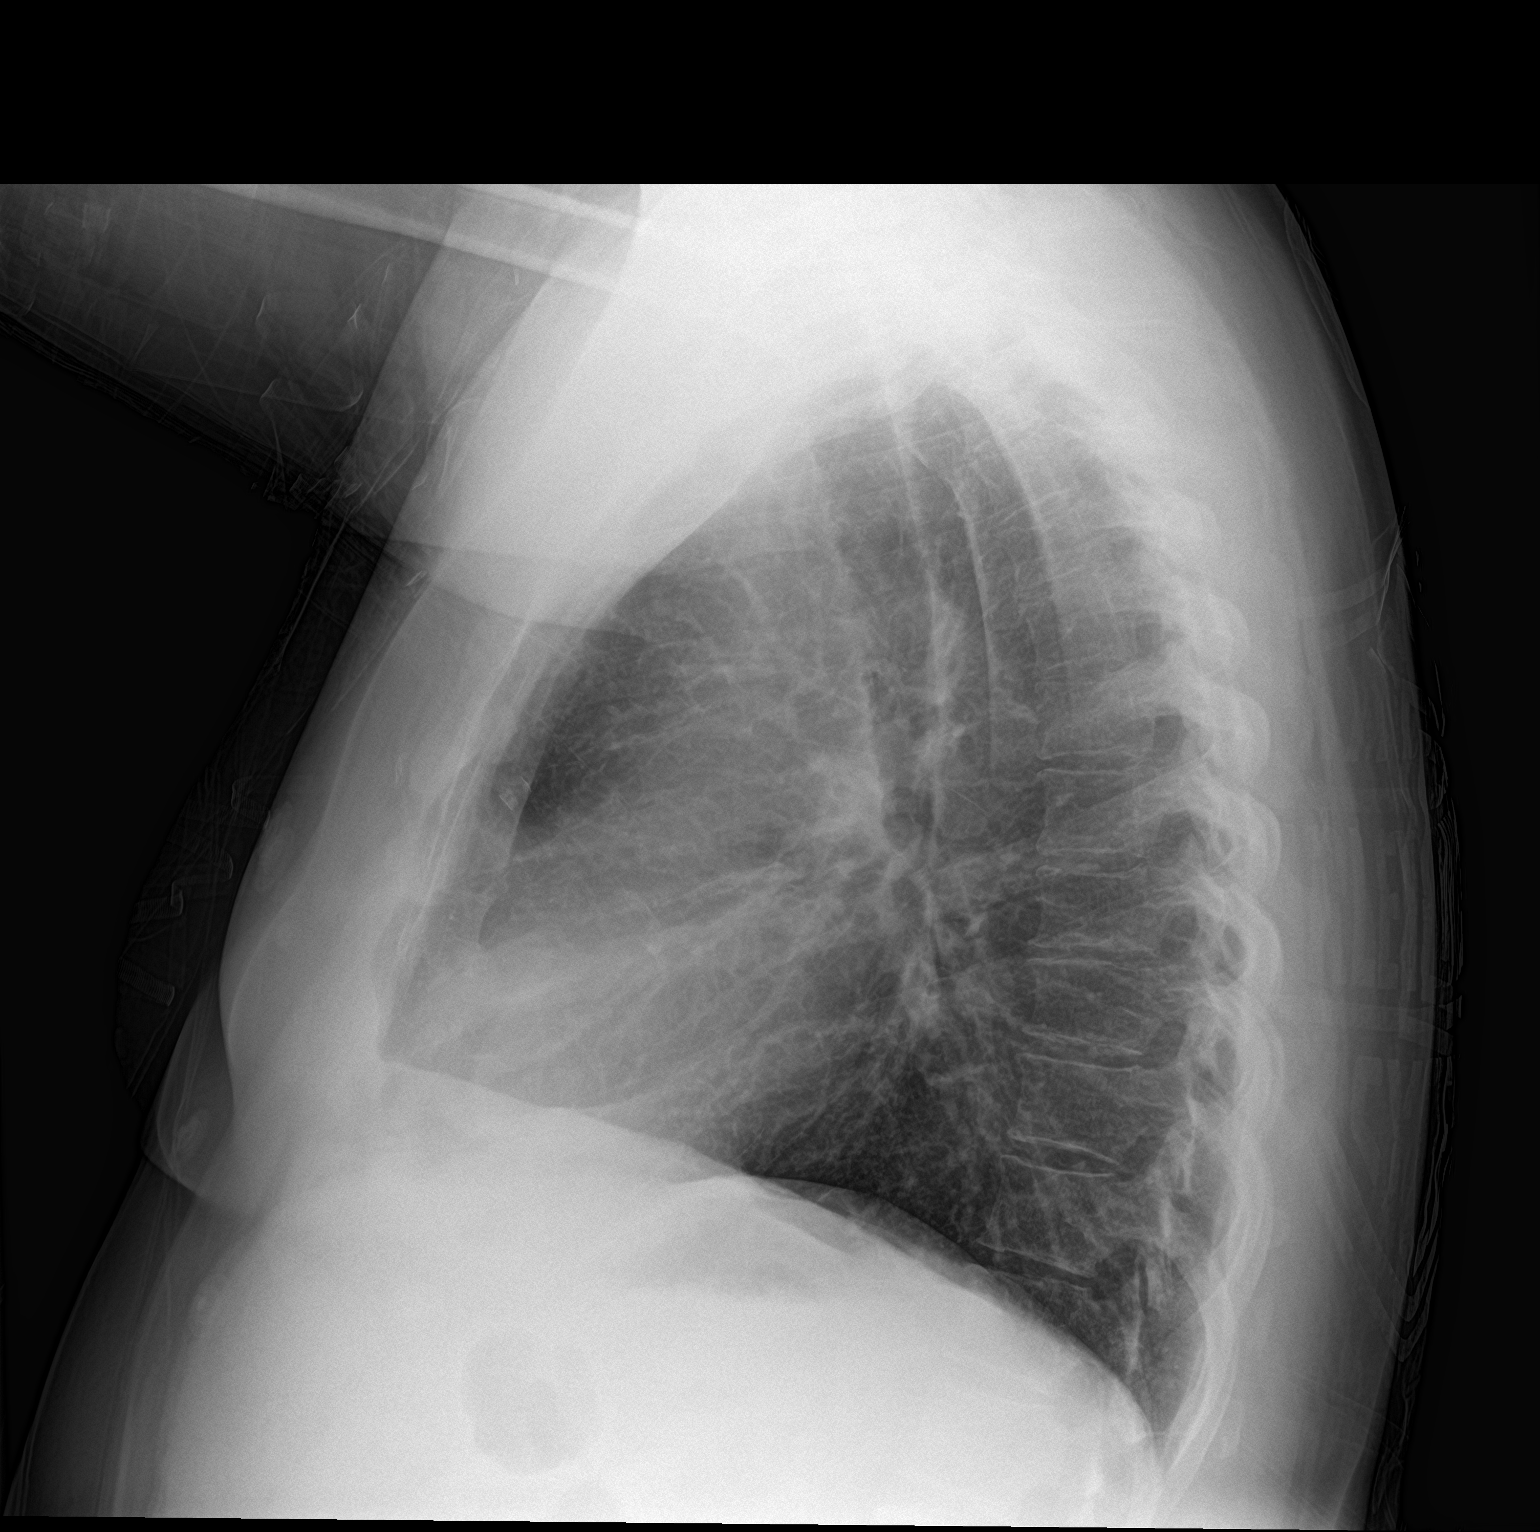

[2 of 2 positions shown; findings below may reference images not displayed]

FINDINGS: Mild hazy and streaky lower lung opacity. No pleural effusion.
Normal cardiac size. No pneumothorax
IMPRESSION: Minimal hazy and streaky lower lung opacity, possible pneumonia to
include atypical or viral process.

## 2021-12-21 NOTE — ED Notes (Signed)
Pt verbalizes understanding of MSE statesments, not able to obtain signature due to broken sig pad

## 2021-12-21 NOTE — ED Triage Notes (Signed)
Pt states he feels weak, has generalized abd p[ain, vomiting, taste and smell alteration, shortness of breath and cough.

## 2021-12-22 ENCOUNTER — Emergency Department
Admission: EM | Admit: 2021-12-22 | Discharge: 2021-12-22 | Disposition: A | Discharge status: Home / Self Care | Payer: Medicare HMO | Attending: Emergency Medicine | Admitting: Emergency Medicine

## 2021-12-22 LAB — TROPONIN I (HIGH SENSITIVITY): Troponin I (High Sensitivity): 9 ng/L (ref <18)

## 2021-12-22 LAB — HEPATIC FUNCTION PANEL
ALT: 41 U/L (ref 0–44)
AST: 86 U/L — ABNORMAL HIGH (ref 15–41)
Albumin: 3.5 g/dL (ref 3.5–5.0)
Alkaline Phosphatase: 199 U/L — ABNORMAL HIGH (ref 38–126)
Bilirubin, Direct: 0.2 mg/dL (ref 0.0–0.2)
Indirect Bilirubin: 0.7 mg/dL (ref 0.3–0.9)
Total Bilirubin: 0.9 mg/dL (ref 0.3–1.2)
Total Protein: 9 g/dL — ABNORMAL HIGH (ref 6.5–8.1)

## 2021-12-22 LAB — BASIC METABOLIC PANEL
Anion gap: 11 (ref 5–15)
BUN: <5 mg/dL — ABNORMAL LOW (ref 6–20)
CO2: 22 mmol/L (ref 22–32)
Calcium: 9.3 mg/dL (ref 8.9–10.3)
Chloride: 99 mmol/L (ref 98–111)
Creatinine, Ser: 0.55 mg/dL — ABNORMAL LOW (ref 0.61–1.24)
GFR, Estimated: >60 mL/min (ref >60)
Glucose, Bld: 114 mg/dL — ABNORMAL HIGH (ref 70–99)
Potassium: 2.9 mmol/L — ABNORMAL LOW (ref 3.5–5.1)
Sodium: 132 mmol/L — ABNORMAL LOW (ref 135–145)

## 2021-12-22 LAB — RESP PANEL BY RT-PCR (FLU A&B, COVID) ARPGX2
Influenza A by PCR: NEGATIVE
Influenza B by PCR: NEGATIVE
SARS Coronavirus 2 by RT PCR: POSITIVE — AB

## 2021-12-22 LAB — LIPASE, BLOOD: Lipase: 50 U/L (ref 11–51)

## 2022-02-04 DIAGNOSIS — R5383 Other fatigue: Secondary | ICD-10-CM | POA: Diagnosis not present

## 2022-02-04 DIAGNOSIS — E559 Vitamin D deficiency, unspecified: Secondary | ICD-10-CM | POA: Diagnosis not present

## 2022-02-04 DIAGNOSIS — E785 Hyperlipidemia, unspecified: Secondary | ICD-10-CM | POA: Diagnosis not present

## 2022-02-04 DIAGNOSIS — I10 Essential (primary) hypertension: Secondary | ICD-10-CM | POA: Diagnosis not present

## 2022-02-12 DIAGNOSIS — E785 Hyperlipidemia, unspecified: Secondary | ICD-10-CM | POA: Diagnosis not present

## 2022-02-12 DIAGNOSIS — I10 Essential (primary) hypertension: Secondary | ICD-10-CM | POA: Diagnosis not present

## 2022-02-12 DIAGNOSIS — I2510 Atherosclerotic heart disease of native coronary artery without angina pectoris: Secondary | ICD-10-CM | POA: Diagnosis not present

## 2022-05-28 ENCOUNTER — Other Ambulatory Visit: Payer: Self-pay

## 2022-05-28 ENCOUNTER — Inpatient Hospital Stay
Admission: EM | Admit: 2022-05-28 | Discharge: 2022-06-01 | DRG: 381 | Disposition: A | Discharge status: Home / Self Care | Payer: Medicare HMO | Attending: Internal Medicine | Admitting: Internal Medicine

## 2022-05-28 ENCOUNTER — Emergency Department: Payer: Medicare HMO

## 2022-05-28 ENCOUNTER — Observation Stay: Payer: Medicare HMO

## 2022-05-28 DIAGNOSIS — K219 Gastro-esophageal reflux disease without esophagitis: Secondary | ICD-10-CM | POA: Diagnosis present

## 2022-05-28 DIAGNOSIS — Z720 Tobacco use: Secondary | ICD-10-CM | POA: Diagnosis present

## 2022-05-28 DIAGNOSIS — Z955 Presence of coronary angioplasty implant and graft: Secondary | ICD-10-CM

## 2022-05-28 DIAGNOSIS — E785 Hyperlipidemia, unspecified: Secondary | ICD-10-CM | POA: Diagnosis present

## 2022-05-28 DIAGNOSIS — I2489 Other forms of acute ischemic heart disease: Secondary | ICD-10-CM | POA: Diagnosis present

## 2022-05-28 DIAGNOSIS — Z8249 Family history of ischemic heart disease and other diseases of the circulatory system: Secondary | ICD-10-CM | POA: Diagnosis not present

## 2022-05-28 DIAGNOSIS — K922 Gastrointestinal hemorrhage, unspecified: Secondary | ICD-10-CM | POA: Diagnosis present

## 2022-05-28 DIAGNOSIS — D649 Anemia, unspecified: Secondary | ICD-10-CM | POA: Diagnosis not present

## 2022-05-28 DIAGNOSIS — F17210 Nicotine dependence, cigarettes, uncomplicated: Secondary | ICD-10-CM | POA: Diagnosis present

## 2022-05-28 DIAGNOSIS — Z79899 Other long term (current) drug therapy: Secondary | ICD-10-CM

## 2022-05-28 DIAGNOSIS — G43809 Other migraine, not intractable, without status migrainosus: Secondary | ICD-10-CM | POA: Diagnosis not present

## 2022-05-28 DIAGNOSIS — Z888 Allergy status to other drugs, medicaments and biological substances status: Secondary | ICD-10-CM

## 2022-05-28 DIAGNOSIS — I255 Ischemic cardiomyopathy: Secondary | ICD-10-CM | POA: Diagnosis present

## 2022-05-28 DIAGNOSIS — F419 Anxiety disorder, unspecified: Secondary | ICD-10-CM | POA: Diagnosis present

## 2022-05-28 DIAGNOSIS — K2270 Barrett's esophagus without dysplasia: Secondary | ICD-10-CM | POA: Diagnosis not present

## 2022-05-28 DIAGNOSIS — K3189 Other diseases of stomach and duodenum: Secondary | ICD-10-CM | POA: Diagnosis present

## 2022-05-28 DIAGNOSIS — Z716 Tobacco abuse counseling: Secondary | ICD-10-CM

## 2022-05-28 DIAGNOSIS — I252 Old myocardial infarction: Secondary | ICD-10-CM

## 2022-05-28 DIAGNOSIS — Z8674 Personal history of sudden cardiac arrest: Secondary | ICD-10-CM

## 2022-05-28 DIAGNOSIS — I5032 Chronic diastolic (congestive) heart failure: Secondary | ICD-10-CM | POA: Diagnosis present

## 2022-05-28 DIAGNOSIS — F17200 Nicotine dependence, unspecified, uncomplicated: Secondary | ICD-10-CM | POA: Diagnosis present

## 2022-05-28 DIAGNOSIS — R519 Headache, unspecified: Secondary | ICD-10-CM | POA: Diagnosis present

## 2022-05-28 DIAGNOSIS — K2211 Ulcer of esophagus with bleeding: Principal | ICD-10-CM | POA: Diagnosis present

## 2022-05-28 DIAGNOSIS — G43909 Migraine, unspecified, not intractable, without status migrainosus: Secondary | ICD-10-CM | POA: Diagnosis present

## 2022-05-28 DIAGNOSIS — I214 Non-ST elevation (NSTEMI) myocardial infarction: Secondary | ICD-10-CM | POA: Diagnosis present

## 2022-05-28 DIAGNOSIS — I110 Hypertensive heart disease with heart failure: Secondary | ICD-10-CM | POA: Diagnosis present

## 2022-05-28 DIAGNOSIS — G8929 Other chronic pain: Secondary | ICD-10-CM | POA: Diagnosis present

## 2022-05-28 DIAGNOSIS — K766 Portal hypertension: Secondary | ICD-10-CM | POA: Diagnosis present

## 2022-05-28 DIAGNOSIS — I959 Hypotension, unspecified: Secondary | ICD-10-CM | POA: Diagnosis not present

## 2022-05-28 DIAGNOSIS — K2100 Gastro-esophageal reflux disease with esophagitis, without bleeding: Secondary | ICD-10-CM | POA: Diagnosis not present

## 2022-05-28 DIAGNOSIS — E876 Hypokalemia: Secondary | ICD-10-CM | POA: Diagnosis present

## 2022-05-28 DIAGNOSIS — K760 Fatty (change of) liver, not elsewhere classified: Secondary | ICD-10-CM | POA: Diagnosis not present

## 2022-05-28 DIAGNOSIS — Z7982 Long term (current) use of aspirin: Secondary | ICD-10-CM

## 2022-05-28 DIAGNOSIS — Z7151 Drug abuse counseling and surveillance of drug abuser: Secondary | ICD-10-CM

## 2022-05-28 DIAGNOSIS — F1910 Other psychoactive substance abuse, uncomplicated: Secondary | ICD-10-CM | POA: Diagnosis present

## 2022-05-28 DIAGNOSIS — K2970 Gastritis, unspecified, without bleeding: Secondary | ICD-10-CM | POA: Diagnosis not present

## 2022-05-28 DIAGNOSIS — R079 Chest pain, unspecified: Secondary | ICD-10-CM | POA: Diagnosis present

## 2022-05-28 DIAGNOSIS — I2510 Atherosclerotic heart disease of native coronary artery without angina pectoris: Secondary | ICD-10-CM | POA: Diagnosis not present

## 2022-05-28 DIAGNOSIS — I25119 Atherosclerotic heart disease of native coronary artery with unspecified angina pectoris: Secondary | ICD-10-CM | POA: Diagnosis present

## 2022-05-28 DIAGNOSIS — G4489 Other headache syndrome: Secondary | ICD-10-CM | POA: Diagnosis not present

## 2022-05-28 DIAGNOSIS — Z880 Allergy status to penicillin: Secondary | ICD-10-CM

## 2022-05-28 DIAGNOSIS — K209 Esophagitis, unspecified without bleeding: Secondary | ICD-10-CM | POA: Diagnosis not present

## 2022-05-28 DIAGNOSIS — D500 Iron deficiency anemia secondary to blood loss (chronic): Secondary | ICD-10-CM

## 2022-05-28 DIAGNOSIS — D62 Acute posthemorrhagic anemia: Secondary | ICD-10-CM | POA: Diagnosis present

## 2022-05-28 DIAGNOSIS — K2980 Duodenitis without bleeding: Secondary | ICD-10-CM | POA: Diagnosis present

## 2022-05-28 DIAGNOSIS — R109 Unspecified abdominal pain: Secondary | ICD-10-CM | POA: Diagnosis not present

## 2022-05-28 DIAGNOSIS — K921 Melena: Secondary | ICD-10-CM | POA: Diagnosis not present

## 2022-05-28 DIAGNOSIS — K2210 Ulcer of esophagus without bleeding: Secondary | ICD-10-CM | POA: Diagnosis not present

## 2022-05-28 DIAGNOSIS — Z91018 Allergy to other foods: Secondary | ICD-10-CM

## 2022-05-28 LAB — URINALYSIS, ROUTINE W REFLEX MICROSCOPIC
Bilirubin Urine: NEGATIVE
Glucose, UA: NEGATIVE mg/dL
Hgb urine dipstick: NEGATIVE
Ketones, ur: NEGATIVE mg/dL
Leukocytes,Ua: NEGATIVE
Nitrite: NEGATIVE
Protein, ur: NEGATIVE mg/dL
Specific Gravity, Urine: 1.013 (ref 1.005–1.030)
pH: 8 (ref 5.0–8.0)

## 2022-05-28 LAB — CBC WITH DIFFERENTIAL/PLATELET
Abs Immature Granulocytes: 0.01 10*3/uL (ref 0.00–0.07)
Basophils Absolute: 0.1 10*3/uL (ref 0.0–0.1)
Basophils Relative: 1 %
Eosinophils Absolute: 0 10*3/uL (ref 0.0–0.5)
Eosinophils Relative: 1 %
HCT: 27.7 % — ABNORMAL LOW (ref 39.0–52.0)
Hemoglobin: 8.8 g/dL — ABNORMAL LOW (ref 13.0–17.0)
Immature Granulocytes: 0 %
Lymphocytes Relative: 20 %
Lymphs Abs: 1.2 10*3/uL (ref 0.7–4.0)
MCH: 30.2 pg (ref 26.0–34.0)
MCHC: 31.8 g/dL (ref 30.0–36.0)
MCV: 95.2 fL (ref 80.0–100.0)
Monocytes Absolute: 0.6 10*3/uL (ref 0.1–1.0)
Monocytes Relative: 10 %
Neutro Abs: 4.1 10*3/uL (ref 1.7–7.7)
Neutrophils Relative %: 68 %
Platelets: 186 10*3/uL (ref 150–400)
RBC: 2.91 MIL/uL — ABNORMAL LOW (ref 4.22–5.81)
RDW: 17.3 % — ABNORMAL HIGH (ref 11.5–15.5)
WBC: 6 10*3/uL (ref 4.0–10.5)
nRBC: 0 % (ref 0.0–0.2)

## 2022-05-28 LAB — COMPREHENSIVE METABOLIC PANEL
ALT: 47 U/L — ABNORMAL HIGH (ref 0–44)
AST: 138 U/L — ABNORMAL HIGH (ref 15–41)
Albumin: 3.1 g/dL — ABNORMAL LOW (ref 3.5–5.0)
Alkaline Phosphatase: 287 U/L — ABNORMAL HIGH (ref 38–126)
Anion gap: 9 (ref 5–15)
BUN: 6 mg/dL (ref 6–20)
CO2: 23 mmol/L (ref 22–32)
Calcium: 8.7 mg/dL — ABNORMAL LOW (ref 8.9–10.3)
Chloride: 106 mmol/L (ref 98–111)
Creatinine, Ser: 0.53 mg/dL — ABNORMAL LOW (ref 0.61–1.24)
GFR, Estimated: >60 mL/min (ref >60)
Glucose, Bld: 113 mg/dL — ABNORMAL HIGH (ref 70–99)
Potassium: 3.2 mmol/L — ABNORMAL LOW (ref 3.5–5.1)
Sodium: 138 mmol/L (ref 135–145)
Total Bilirubin: 2 mg/dL — ABNORMAL HIGH (ref 0.3–1.2)
Total Protein: 7.8 g/dL (ref 6.5–8.1)

## 2022-05-28 LAB — URINE DRUG SCREEN, QUALITATIVE (ARMC ONLY)
Amphetamines, Ur Screen: NOT DETECTED
Barbiturates, Ur Screen: NOT DETECTED
Benzodiazepine, Ur Scrn: NOT DETECTED
Cannabinoid 50 Ng, Ur ~~LOC~~: NOT DETECTED
Cocaine Metabolite,Ur ~~LOC~~: POSITIVE — AB
MDMA (Ecstasy)Ur Screen: NOT DETECTED
Methadone Scn, Ur: NOT DETECTED
Opiate, Ur Screen: NOT DETECTED
Phencyclidine (PCP) Ur S: NOT DETECTED
Tricyclic, Ur Screen: NOT DETECTED

## 2022-05-28 LAB — MAGNESIUM: Magnesium: 1.7 mg/dL (ref 1.7–2.4)

## 2022-05-28 LAB — LIPASE, BLOOD: Lipase: 34 U/L (ref 11–51)

## 2022-05-28 LAB — TROPONIN I (HIGH SENSITIVITY)
Troponin I (High Sensitivity): 9 ng/L (ref <18)
Troponin I (High Sensitivity): 12 ng/L (ref <18)
Troponin I (High Sensitivity): 7 ng/L (ref <18)

## 2022-05-28 LAB — ETHANOL: Alcohol, Ethyl (B): <10 mg/dL (ref <10)

## 2022-05-28 IMAGING — CT CT HEAD W/O CM
4 of 8 series · 17 of 47 positions shown, 18 images · non-contrast
Comparison: CT brain [DATE]

CLINICAL DATA: Positional headache. New or worsening. Migraine.
Started a week ago. Got worse today. Ten 10 pain. Patient reports
history of migraines.



[Series 2: head bone · axial · 0.43mm/px · z∈[-90,+46]mm · 8 of 82 slices shown]
[im 7/82  bone]
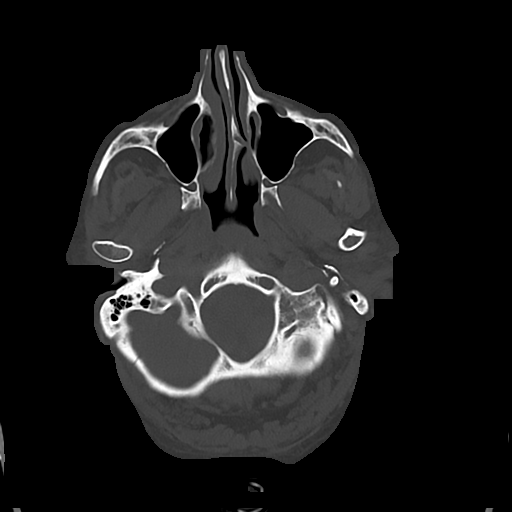
[im 19/82  bone]
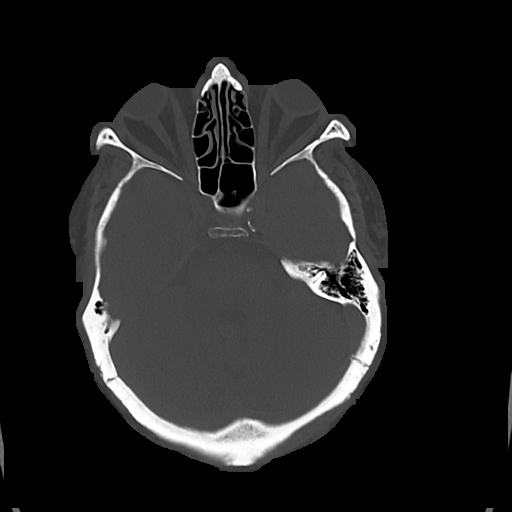
[im 25/82  bone]
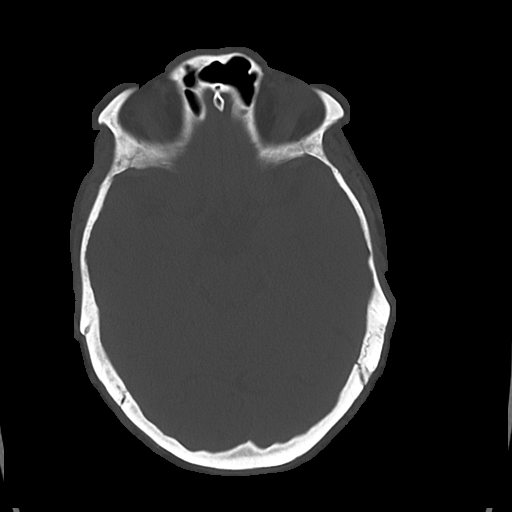
[im 38/82  bone]
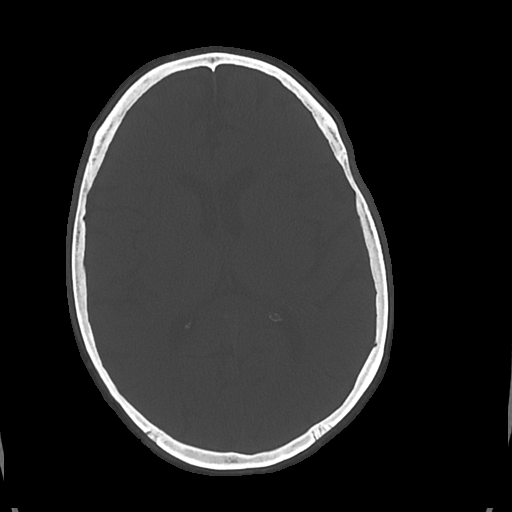
[im 44/82  bone]
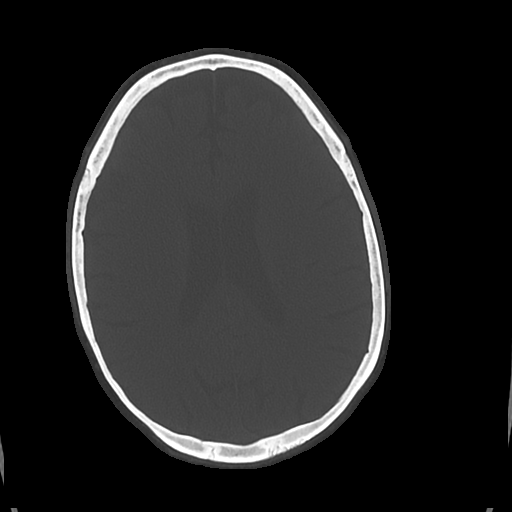
[im 57/82  bone]
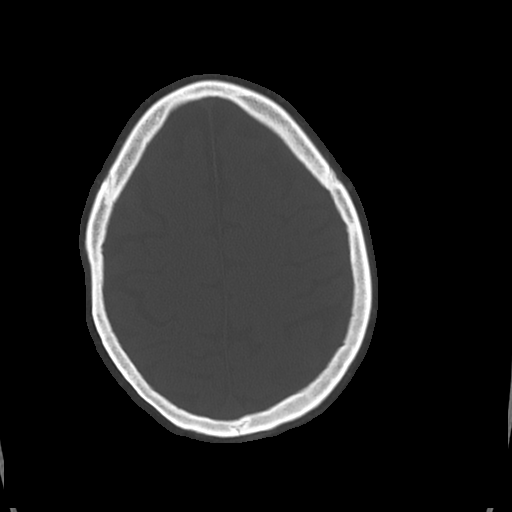
[im 63/82  bone]
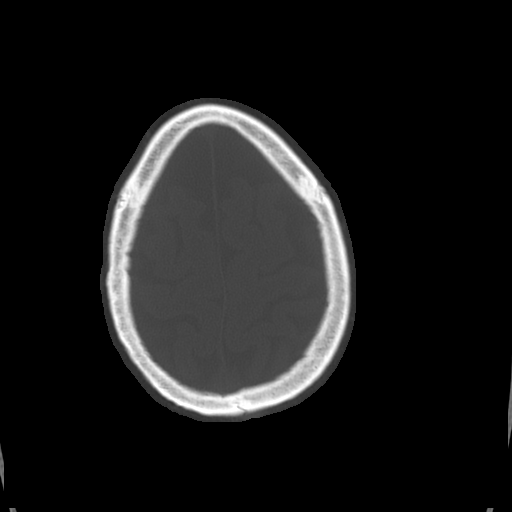
[im 75/82  bone]
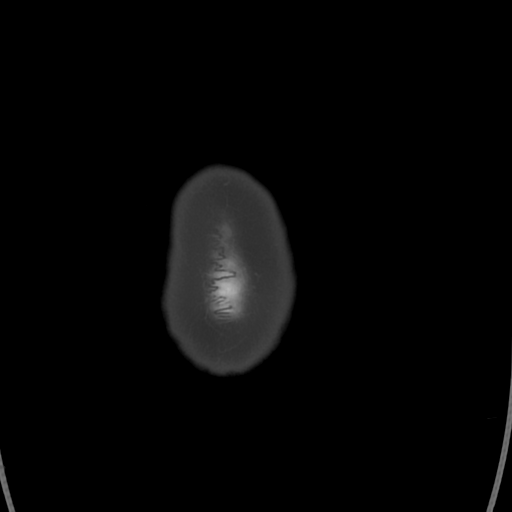

[Series 3: head wo · axial · 0.43mm/px · z∈[-72,+22]mm · 4 of 33 slices shown, 5 images]
[im 7/33  brain]
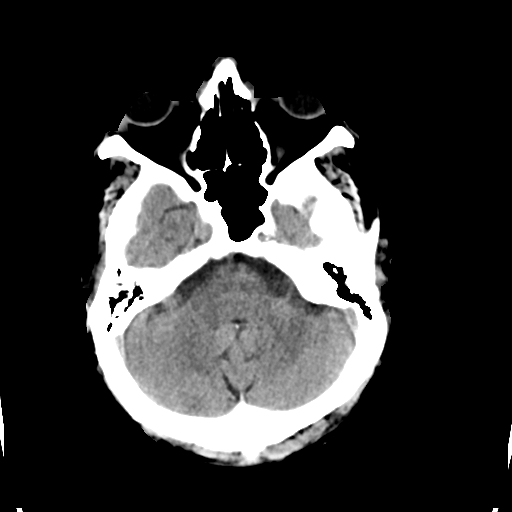
[im 7/33  bone]
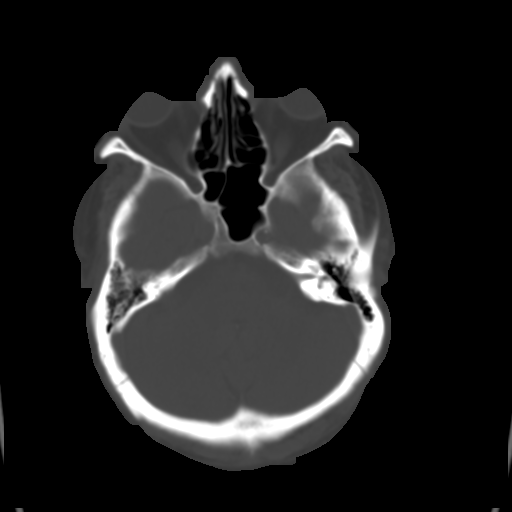
[im 13/33  brain]
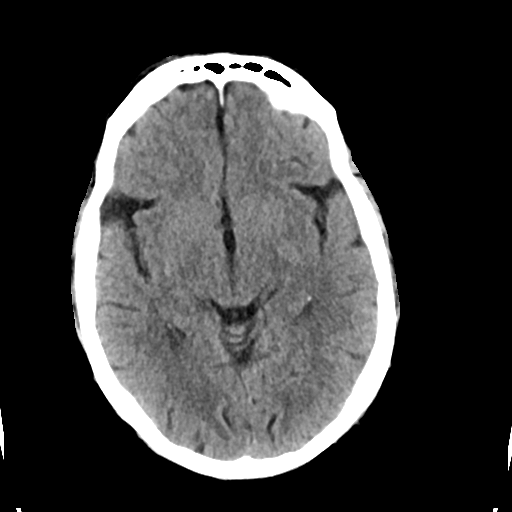
[im 20/33  brain]
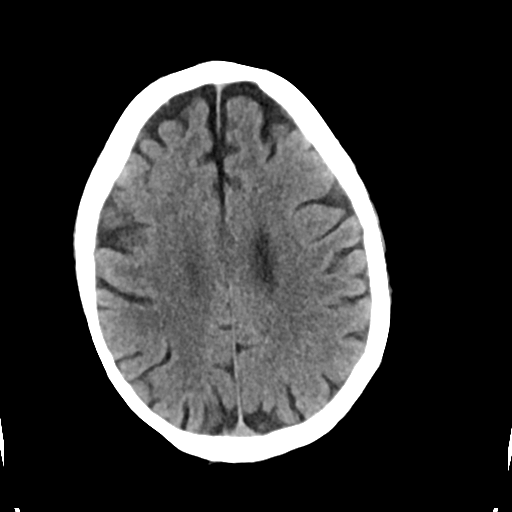
[im 26/33  brain]
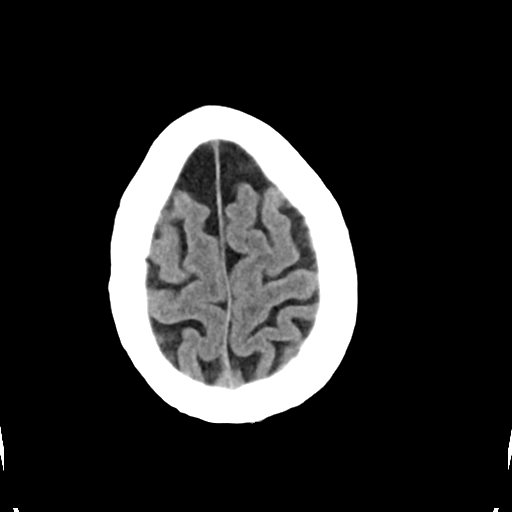

[Series 4: cor soft · coronal · 0.33mm/px · 3 of 68 slices shown]
[im 17/68  brain]
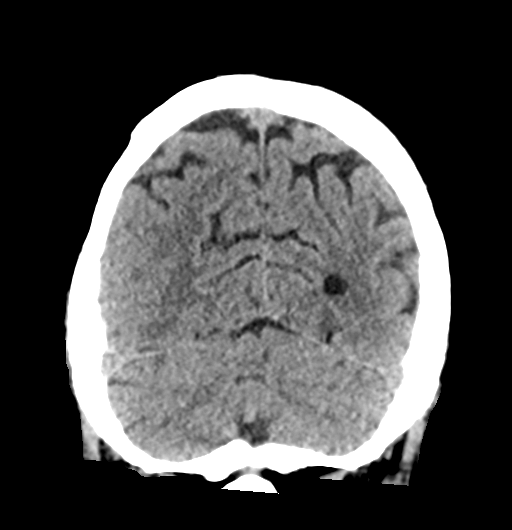
[im 34/68  brain]
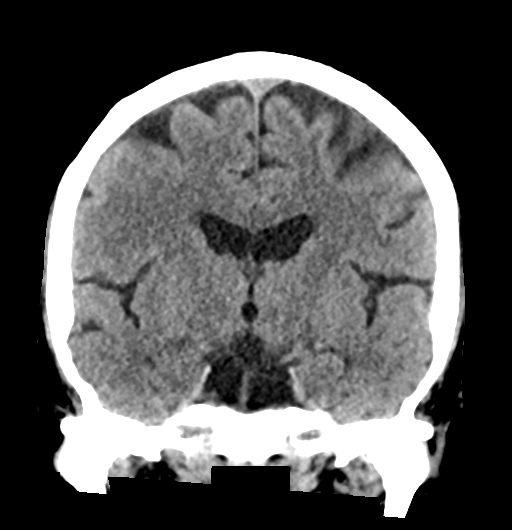
[im 51/68  brain]
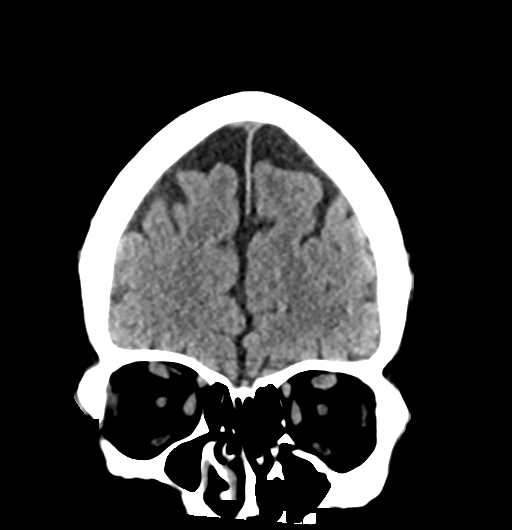

[Series 5: sag soft · sagittal · 0.32mm/px · 2 of 51 slices shown]
[im 17/51  brain]
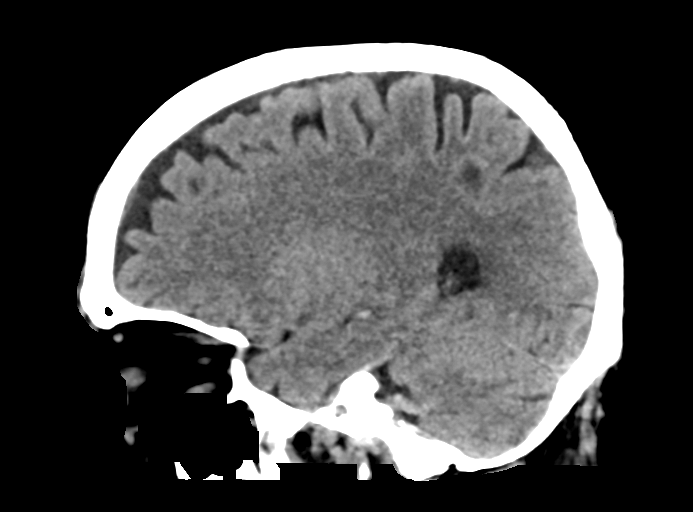
[im 34/51  brain]
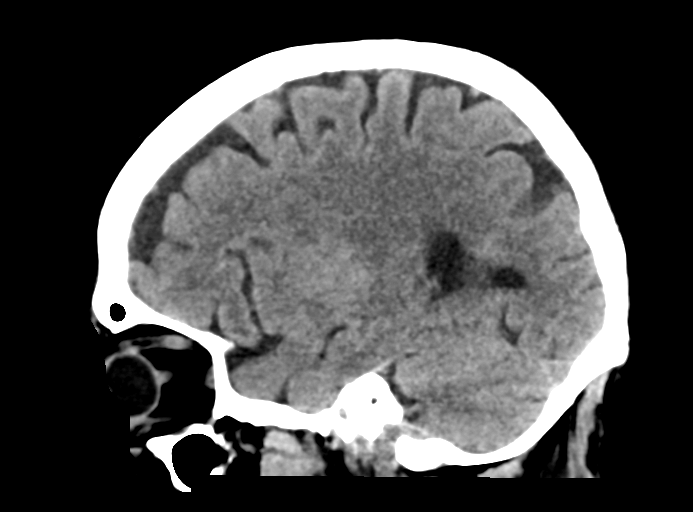

[17 of 47 positions shown; findings below may reference images not displayed]

FINDINGS: Brain: There is mild cortical atrophy, within normal limits for
patient age. The ventricles are normal in configuration. The basilar
cisterns are patent.

No mass, mass effect, or midline shift.

No acute intracranial hemorrhage is seen.

No abnormal extra-axial fluid collection.

Preservation of the normal cortical gray-white interface without CT
evidence of an acute major vascular territorial cortical based
infarction.

Vascular: No hyperdense vessel or unexpected calcification.

Skull: Normal. Negative for fracture or focal lesion.

Sinuses/Orbits: The visualized orbits are unremarkable. The
visualized paranasal sinuses and mastoid air cells are clear.

Other: None.
IMPRESSION: No acute intracranial process.

## 2022-05-28 IMAGING — DX DG CHEST 1V
1 series · 2 of 2 positions shown · non-contrast
Comparison: Prior chest radiographs [DATE] and earlier. Chest
CT [DATE].

CLINICAL DATA: Provided history: Chest pain.

EXAM:
CHEST  1 VIEW

[Series 1: chest ap · 0.14mm/px · 2 of 2 slices shown]
[im 1/2]
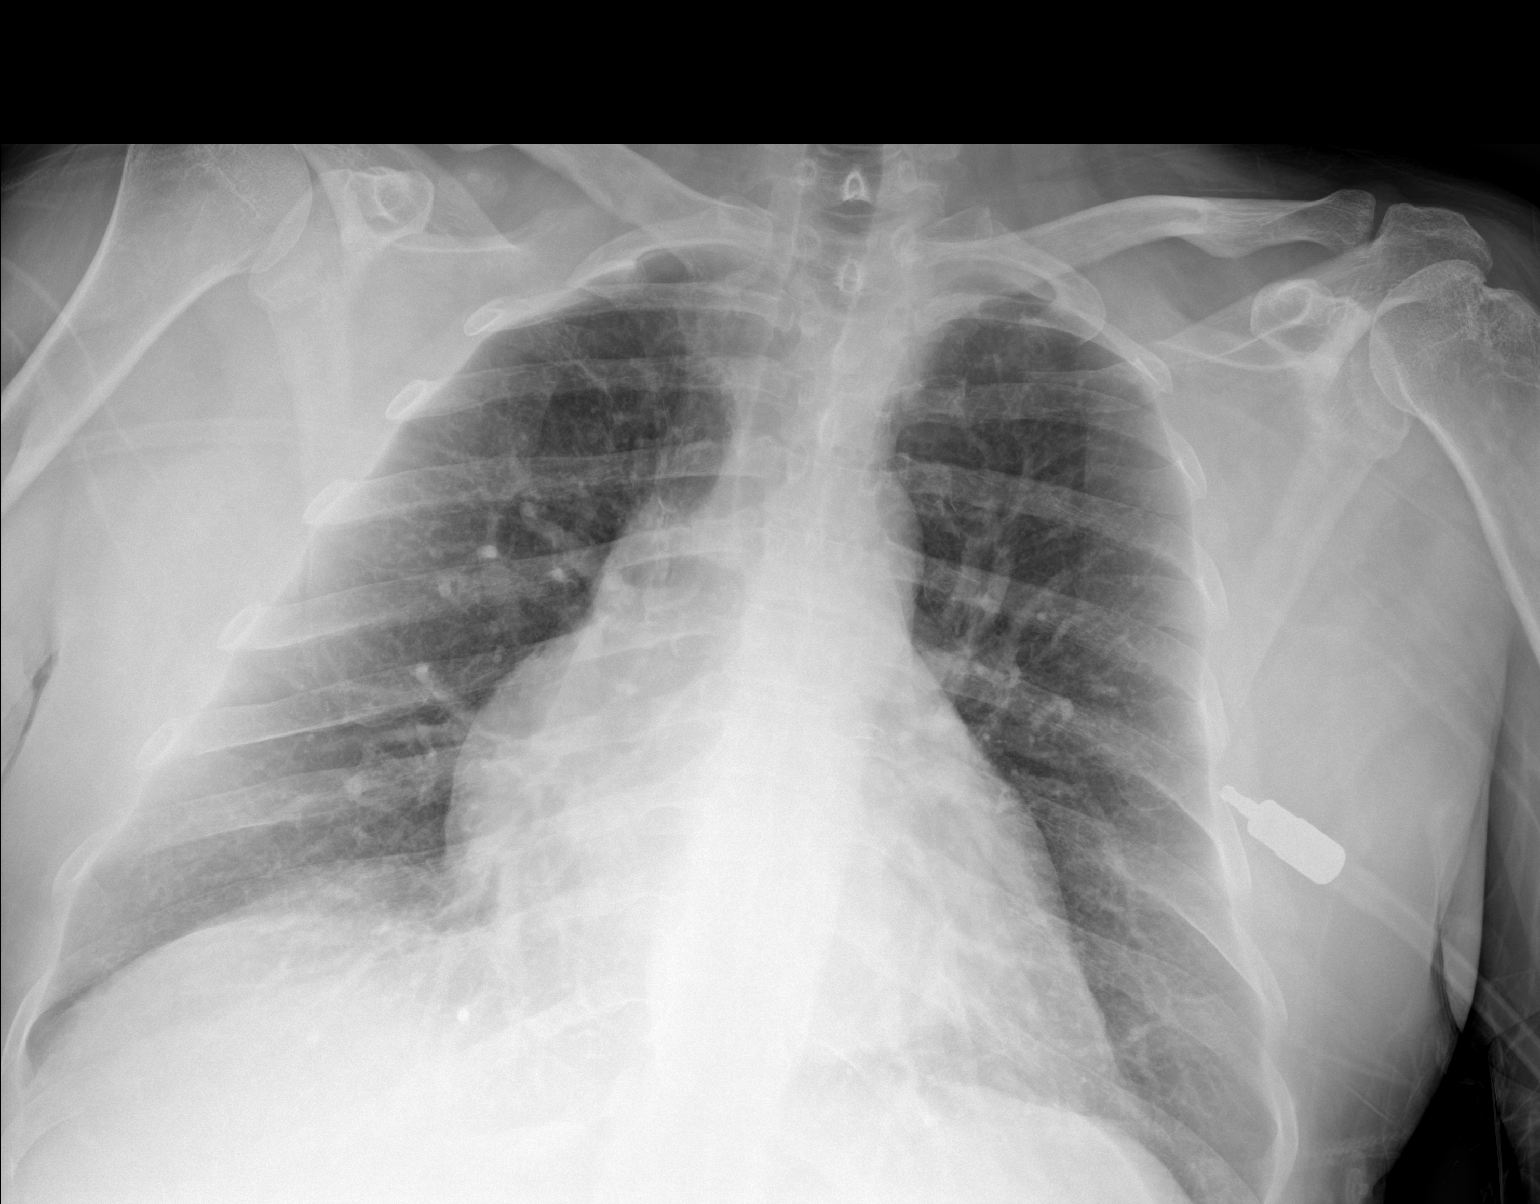
[im 2/2]
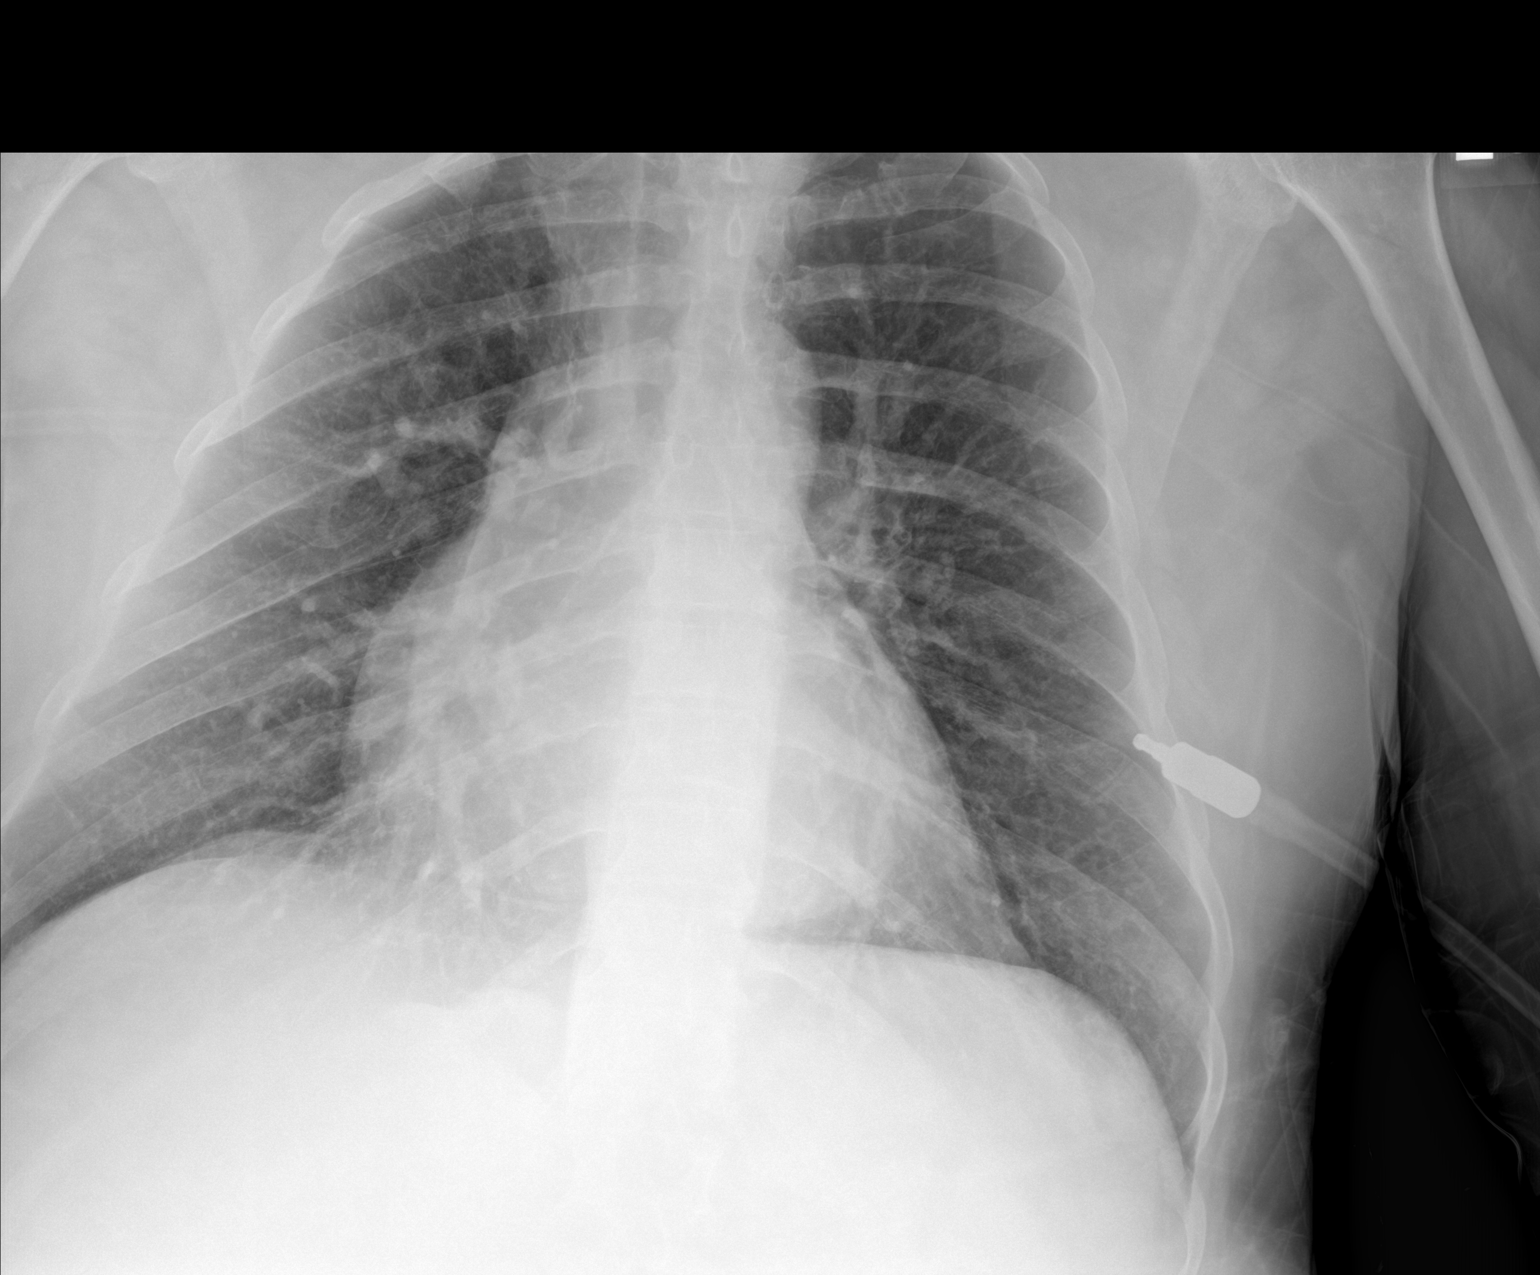

[2 of 2 positions shown; findings below may reference images not displayed]

FINDINGS: Heart size at the upper limits of normal. No appreciable airspace
consolidation or pulmonary edema. No evidence of pleural effusion or
pneumothorax. No acute bony abnormality identified.
IMPRESSION: No evidence of acute cardiopulmonary abnormality.

## 2022-05-28 IMAGING — US US ABDOMEN LIMITED
1 series · 14 of 25 positions shown · non-contrast
Comparison: None Available.

CLINICAL DATA: [EQ].  Abdominal pain.

EXAM:
ULTRASOUND ABDOMEN LIMITED RIGHT UPPER QUADRANT

[Series 1: us abdomen limited ruq (liver/gb) · 14 of 36 slices shown]
[im 1/36]
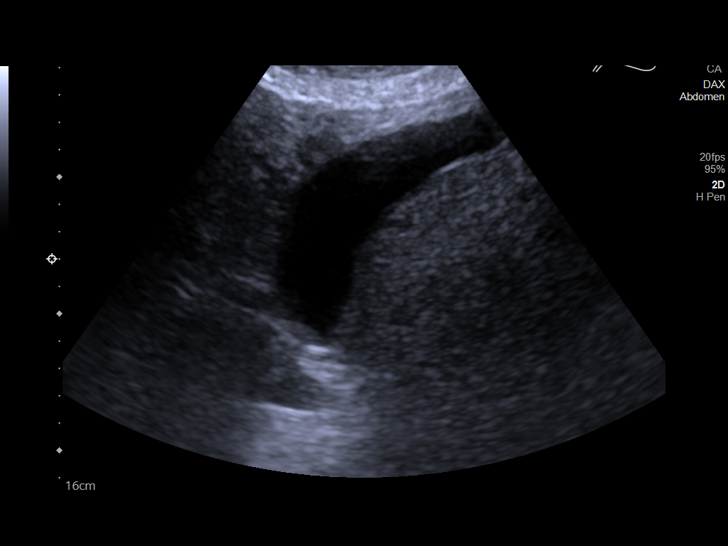
[im 3/36]
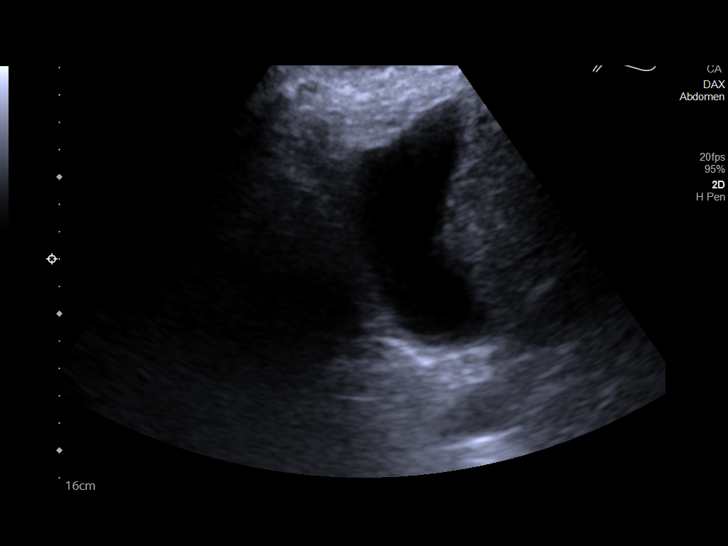
[im 6/36]
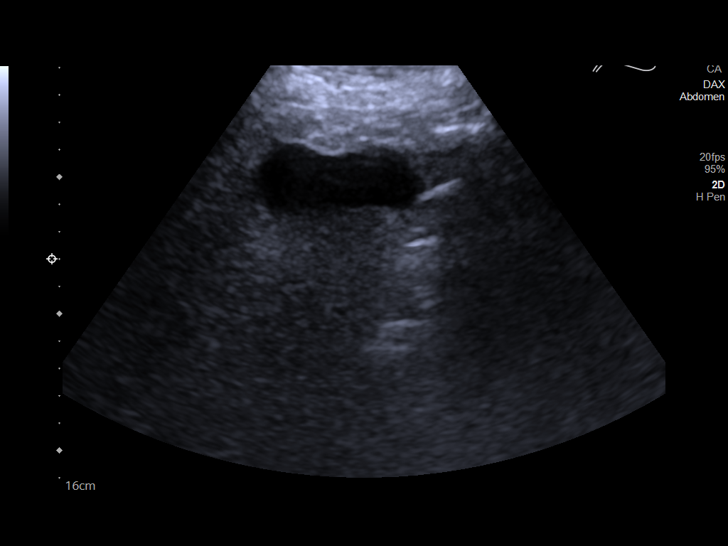
[im 9/36]
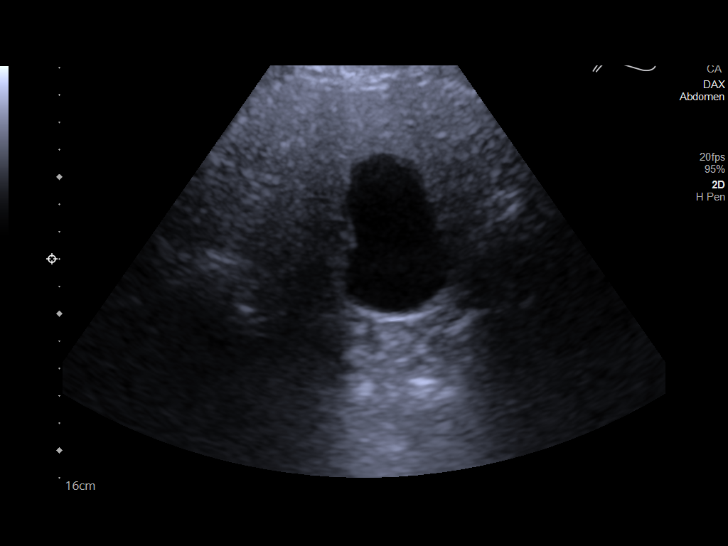
[im 12/36]
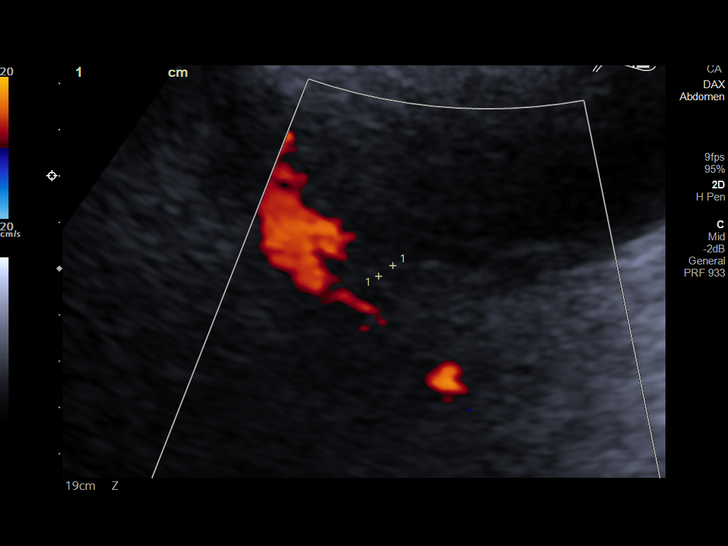
[im 14/36]
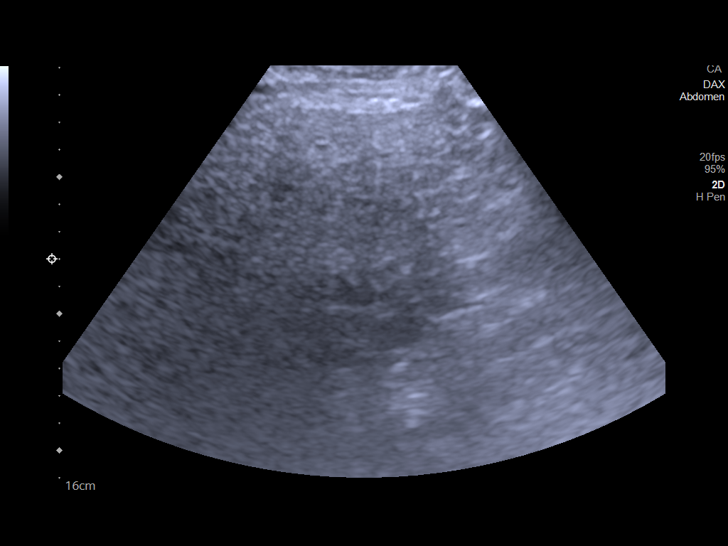
[im 17/36]
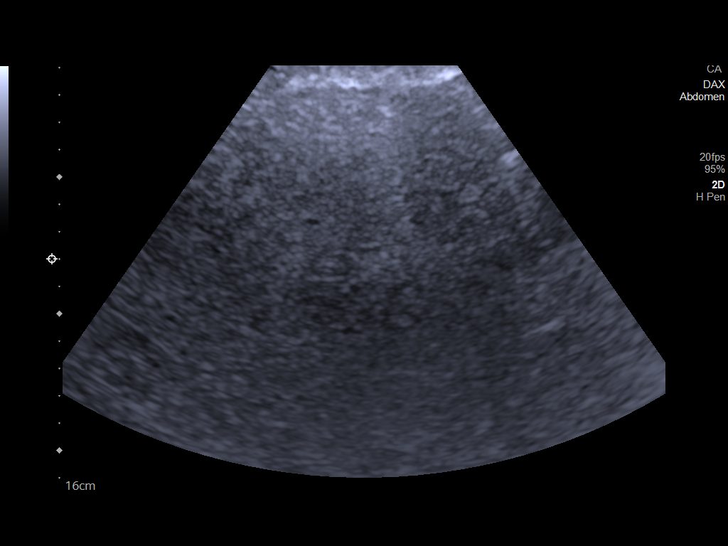
[im 19/36]
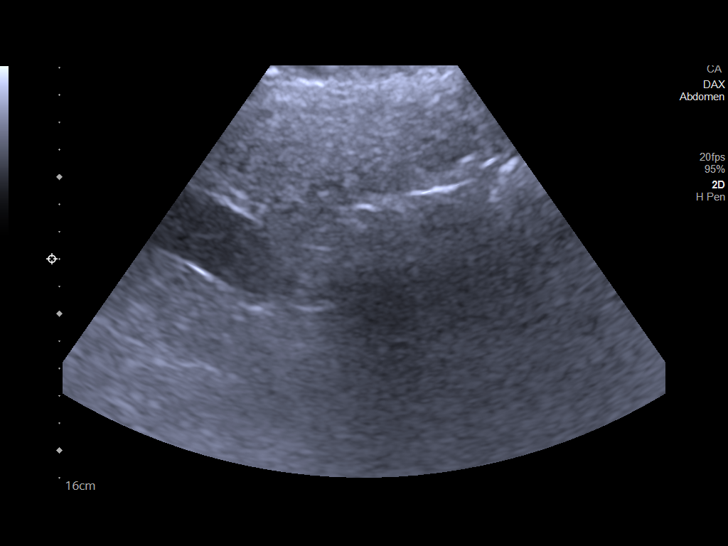
[im 22/36]
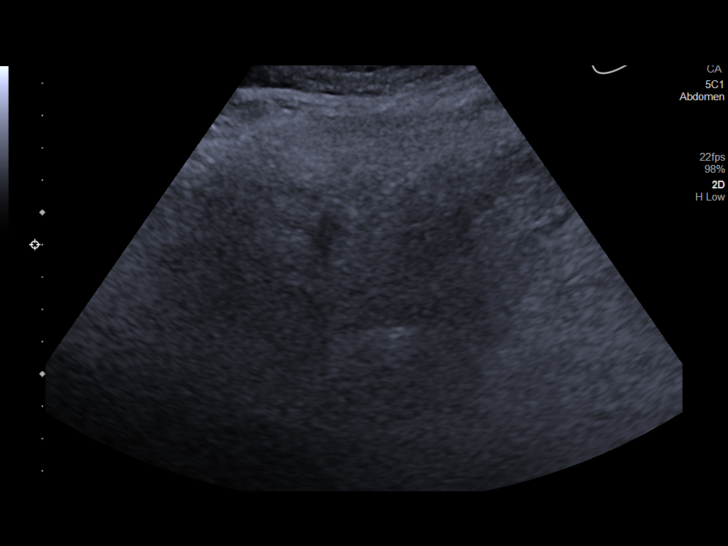
[im 24/36]
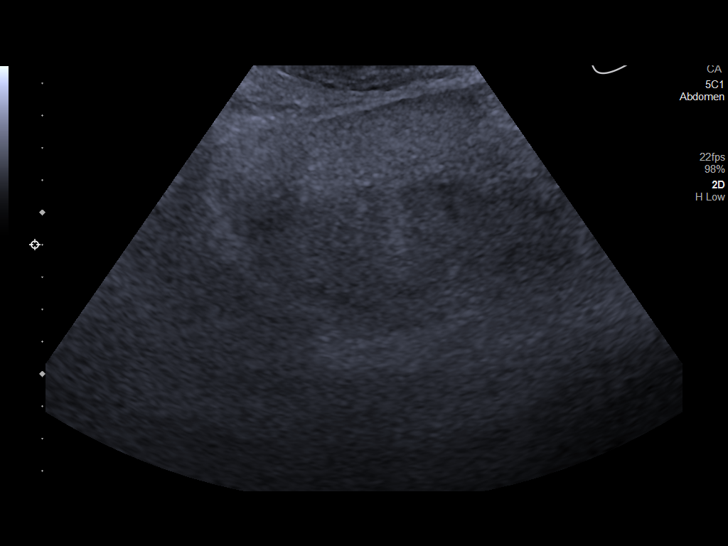
[im 27/36]
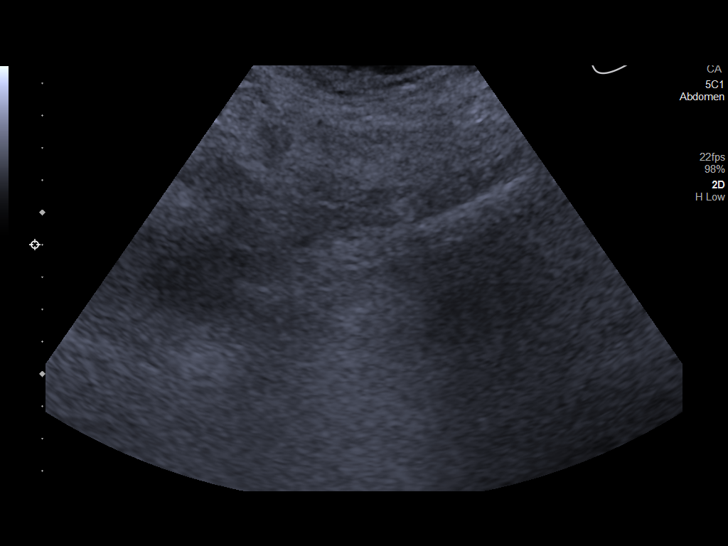
[im 30/36]
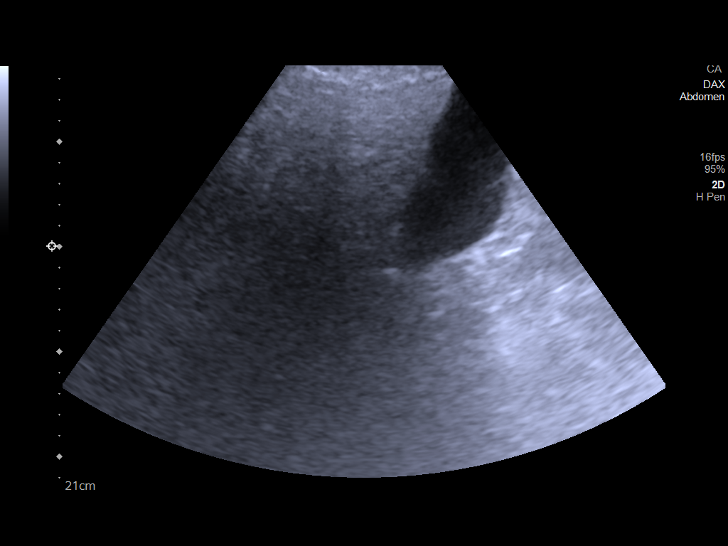
[im 33/36]
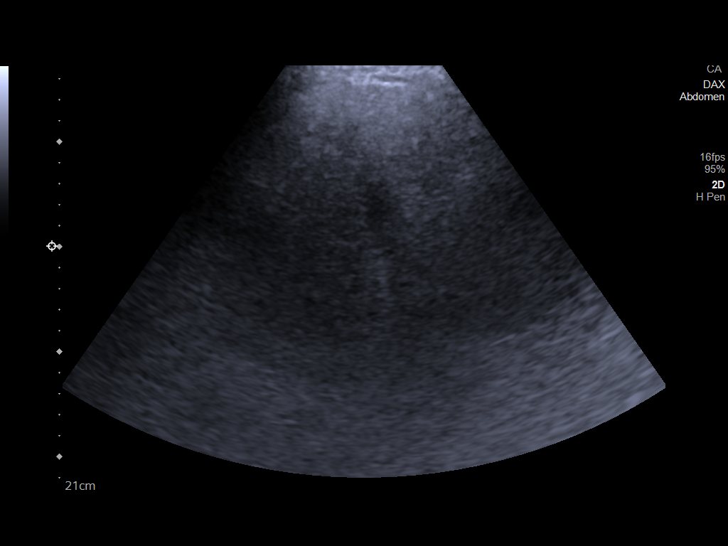
[im 36/36]
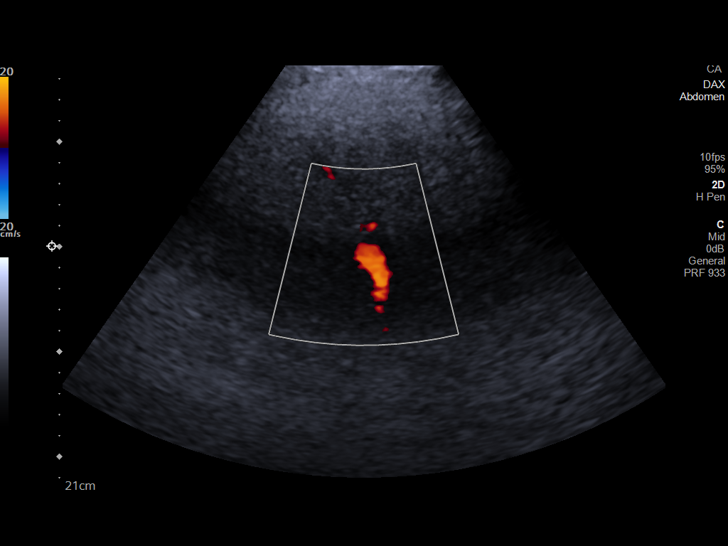

[14 of 25 positions shown; findings below may reference images not displayed]

FINDINGS: Gallbladder:

No gallstones or wall thickening visualized. No sonographic Murphy
sign noted by sonographer.

Common bile duct:

Diameter: 4 mm in proximal diameter

Liver:

Hepatic parenchymal echogenicity is diffusely increased and there is
poor acoustic through transmission in keeping with severe hepatic
steatosis. Parenchymal changes appear progressive since prior
examination. This limits the evaluation for detection of focal
intrahepatic masses, though no definite focal intrahepatic masses
are seen and there is no intrahepatic biliary ductal dilation.
portal vein is patent on color Doppler imaging with normal direction
of blood flow towards the liver.

Other: None.
IMPRESSION: Marked hepatic steatosis, progressive since prior examination.

## 2022-05-28 MED ORDER — SODIUM CHLORIDE 0.9 % IV BOLUS
1000.0000 mL | Freq: Once | INTRAVENOUS | Status: AC
  Administered 2022-05-28: 1000 mL via INTRAVENOUS

## 2022-05-28 MED ORDER — ATORVASTATIN CALCIUM 20 MG PO TABS
40.0000 mg | ORAL_TABLET | Freq: Every day | ORAL | Status: DC
  Administered 2022-05-28 – 2022-06-01 (×5): 40 mg via ORAL
  Filled 2022-05-28 (×5): qty 2

## 2022-05-28 MED ORDER — LORAZEPAM 2 MG/ML IJ SOLN
1.0000 mg | INTRAMUSCULAR | Status: AC | PRN
  Administered 2022-05-29: 2 mg via INTRAVENOUS
  Filled 2022-05-28: qty 1

## 2022-05-28 MED ORDER — DEXAMETHASONE SODIUM PHOSPHATE 10 MG/ML IJ SOLN
10.0000 mg | Freq: Once | INTRAMUSCULAR | Status: AC
  Administered 2022-05-28: 10 mg via INTRAVENOUS
  Filled 2022-05-28: qty 1

## 2022-05-28 MED ORDER — ONDANSETRON HCL 4 MG PO TABS: 4.0000 mg | ORAL_TABLET | Freq: Four times a day (QID) | ORAL | Status: DC | PRN

## 2022-05-28 MED ORDER — ONDANSETRON HCL 4 MG/2ML IJ SOLN
4.0000 mg | Freq: Four times a day (QID) | INTRAMUSCULAR | Status: DC | PRN
  Administered 2022-05-29 – 2022-05-30 (×2): 4 mg via INTRAVENOUS
  Filled 2022-05-28 (×2): qty 2

## 2022-05-28 MED ORDER — FOLIC ACID 1 MG PO TABS
1.0000 mg | ORAL_TABLET | Freq: Every day | ORAL | Status: DC
  Administered 2022-05-28 – 2022-06-01 (×5): 1 mg via ORAL
  Filled 2022-05-28 (×5): qty 1

## 2022-05-28 MED ORDER — LORAZEPAM 1 MG PO TABS
1.0000 mg | ORAL_TABLET | ORAL | Status: AC | PRN
  Administered 2022-05-28 – 2022-05-29 (×3): 1 mg via ORAL
  Filled 2022-05-28 (×3): qty 1

## 2022-05-28 MED ORDER — PANTOPRAZOLE SODIUM 40 MG IV SOLR
40.0000 mg | Freq: Once | INTRAVENOUS | Status: AC
  Administered 2022-05-28: 40 mg via INTRAVENOUS
  Filled 2022-05-28: qty 10

## 2022-05-28 MED ORDER — PANTOPRAZOLE SODIUM 40 MG IV SOLR
40.0000 mg | INTRAVENOUS | Status: DC
  Administered 2022-05-29: 40 mg via INTRAVENOUS
  Filled 2022-05-28: qty 10

## 2022-05-28 MED ORDER — ONDANSETRON HCL 4 MG/2ML IJ SOLN
4.0000 mg | Freq: Once | INTRAMUSCULAR | Status: AC
  Administered 2022-05-28: 4 mg via INTRAVENOUS
  Filled 2022-05-28: qty 2

## 2022-05-28 MED ORDER — THIAMINE HCL 100 MG PO TABS
100.0000 mg | ORAL_TABLET | Freq: Every day | ORAL | Status: DC
  Administered 2022-05-28 – 2022-06-01 (×5): 100 mg via ORAL
  Filled 2022-05-28 (×5): qty 1

## 2022-05-28 MED ORDER — THIAMINE HCL 100 MG/ML IJ SOLN: 100.0000 mg | Freq: Every day | INTRAMUSCULAR | Status: DC

## 2022-05-28 MED ORDER — BUTALBITAL-APAP-CAFFEINE 50-325-40 MG PO TABS
2.0000 | ORAL_TABLET | Freq: Four times a day (QID) | ORAL | Status: DC | PRN
  Administered 2022-05-28 – 2022-06-01 (×12): 2 via ORAL
  Filled 2022-05-28 (×14): qty 2

## 2022-05-28 MED ORDER — METOCLOPRAMIDE HCL 5 MG/ML IJ SOLN
10.0000 mg | Freq: Once | INTRAMUSCULAR | Status: AC
  Administered 2022-05-28: 10 mg via INTRAVENOUS
  Filled 2022-05-28: qty 2

## 2022-05-28 MED ORDER — POTASSIUM CHLORIDE 2 MEQ/ML IV SOLN
INTRAVENOUS | Status: AC
  Filled 2022-05-28: qty 1000

## 2022-05-28 MED ORDER — ADULT MULTIVITAMIN W/MINERALS CH
1.0000 | ORAL_TABLET | Freq: Every day | ORAL | Status: DC
  Administered 2022-05-28 – 2022-06-01 (×5): 1 via ORAL
  Filled 2022-05-28 (×5): qty 1

## 2022-05-28 MED ORDER — ACETAMINOPHEN 650 MG RE SUPP: 650.0000 mg | Freq: Four times a day (QID) | RECTAL | Status: DC | PRN

## 2022-05-28 MED ORDER — NICOTINE 14 MG/24HR TD PT24
14.0000 mg | MEDICATED_PATCH | Freq: Every day | TRANSDERMAL | Status: DC
  Administered 2022-05-28 – 2022-06-01 (×5): 14 mg via TRANSDERMAL
  Filled 2022-05-28 (×6): qty 1

## 2022-05-28 MED ORDER — KETOROLAC TROMETHAMINE 30 MG/ML IJ SOLN
15.0000 mg | Freq: Once | INTRAMUSCULAR | Status: AC
  Administered 2022-05-28: 15 mg via INTRAVENOUS
  Filled 2022-05-28: qty 1

## 2022-05-28 MED ORDER — ACETAMINOPHEN 325 MG PO TABS
650.0000 mg | ORAL_TABLET | Freq: Four times a day (QID) | ORAL | Status: DC | PRN
  Administered 2022-05-28 – 2022-05-31 (×5): 650 mg via ORAL
  Filled 2022-05-28 (×5): qty 2

## 2022-05-28 NOTE — Assessment & Plan Note (Signed)
Treatment as outlined in 1 

## 2022-05-28 NOTE — ED Provider Notes (Signed)
 [facility] Provider Note    Event Date/Time   First MD Initiated Contact with Patient 05/28/22 1345     (approximate)   History   Migraine   HPI  Ethan Wells is a 51 y.o. male presents to the ER for treatment of migraine. Symptoms started about a week ago. Pain is bandlike. He has been nauseated and vomiting off and on. Unable to keep anything down today. Light and sound make pain worse. He tried to take Aleve today, but vomited it up.   Past Medical History:  Diagnosis Date   CAD (coronary artery disease)    a. 01/2011 Anterior STEMI/Cath/PCI: LM nl, LAD 100d (3.5x60mm Vision BMS placed), LCX 9m, RI 50, RCA min irregs, EF 40% w/ apical, inferoapical HK.   Cardiac arrest - ventricular fibrillation    a. In setting of STEMI 01/2011   Chronic pain    Cocaine abuse ([facility])    Depression with anxiety    ETOH abuse    a. 6-12 beers/day   GERD (gastroesophageal reflux disease)    Hemorrhoids    Hyperlipidemia    Hypertension    Ischemic cardiomyopathy    a. 06/2011 Echo: EF 45-50%, No rwma   Marijuana abuse    Migraines    Tobacco abuse    a. 1/2 ppd x 26 yrs     Physical Exam   Triage Vital Signs: ED Triage Vitals  Enc Vitals Group     BP 05/28/22 1331 127/75     Pulse Rate 05/28/22 1331 (!) 101     Resp 05/28/22 1331 19     Temp 05/28/22 1331 98.3 F (36.8 C)     Temp Source 05/28/22 1331 Oral     SpO2 05/28/22 1331 99 %     Weight 05/28/22 1349 199 lb 15.3 oz (90.7 kg)     Height 05/28/22 1349 5\' 5"  (1.651 m)     Head Circumference --      Peak Flow --      Pain Score 05/28/22 1332 10     Pain Loc --      Pain Edu? --      Excl. in GC? --     Most recent vital signs: Vitals:   05/28/22 1331 05/28/22 1600  BP: 127/75 122/68  Pulse: (!) 101 (!) 107  Resp: 19 17  Temp: 98.3 F (36.8 C)   SpO2: 99% 100%    General: Awake, no distress.  CV:  Good peripheral perfusion.  Resp:  Normal effort.  Abd:  No distention.   Other:  No weakness of extremities. No facial droop or slurred speech.   ED Results / Procedures / Treatments   Labs (all labs ordered are listed, but only abnormal results are displayed) Labs Reviewed  COMPREHENSIVE METABOLIC PANEL - Abnormal; Notable for the following components:      Result Value   Potassium 3.2 (*)    Glucose, Bld 113 (*)    Creatinine, Ser 0.53 (*)    Calcium 8.7 (*)    Albumin 3.1 (*)    AST 138 (*)    ALT 47 (*)    Alkaline Phosphatase 287 (*)    Total Bilirubin 2.0 (*)    All other components within normal limits  CBC WITH DIFFERENTIAL/PLATELET - Abnormal; Notable for the following components:   RBC 2.91 (*)    Hemoglobin 8.8 (*)    HCT 27.7 (*)    RDW 17.3 (*)  All other components within normal limits  ETHANOL  URINALYSIS, ROUTINE W REFLEX MICROSCOPIC  URINE DRUG SCREEN, QUALITATIVE ([facility] ONLY)  MAGNESIUM  TYPE AND SCREEN  TROPONIN I (HIGH SENSITIVITY)  TROPONIN I (HIGH SENSITIVITY)     EKG     RADIOLOGY  Chest x-ray negative for acute concerns.  I have independently reviewed and interpreted imaging as well as reviewed report from radiology.  PROCEDURES:  Critical Care performed: No  Procedures   MEDICATIONS ORDERED IN ED:  Medications  LORazepam (ATIVAN) tablet 1-4 mg (has no administration in time range)    Or  LORazepam (ATIVAN) injection 1-4 mg (has no administration in time range)  thiamine tablet 100 mg (has no administration in time range)    Or  thiamine (B-1) injection 100 mg (has no administration in time range)  folic acid (FOLVITE) tablet 1 mg (has no administration in time range)  multivitamin with minerals tablet 1 tablet (has no administration in time range)  sodium chloride 0.9 % bolus 1,000 mL (0 mLs Intravenous Stopped 05/28/22 1601)  ondansetron (ZOFRAN) injection 4 mg (4 mg Intravenous Given 05/28/22 1411)  metoCLOPramide (REGLAN) injection 10 mg (10 mg Intravenous Given 05/28/22 1412)  ketorolac  (TORADOL) 30 MG/ML injection 15 mg (15 mg Intravenous Given 05/28/22 1413)  dexamethasone (DECADRON) injection 10 mg (10 mg Intravenous Given 05/28/22 1411)  pantoprazole (PROTONIX) injection 40 mg (40 mg Intravenous Given 05/28/22 1558)     IMPRESSION / MDM / ASSESSMENT AND PLAN / ED COURSE   I reviewed the triage vital signs and the nursing notes.  Differential diagnosis includes, but is not limited to: migraine,   Patient's presentation is most consistent with acute presentation with potential threat to life or bodily function.  The patient is on the cardiac monitor to evaluate for evidence of arrhythmia and/or significant heart rate changes.  Clinical Course as of 05/28/22 1609  Tue May 28, 2022  1410 Patient now complaining of chest pain.  Nurse reports that the patient is in the room and is very anxious appearing.  Chest pain protocol started.  Patient is noted to have a history of MI and polysubstance abuse. [CT]  1544 Hemoglobin down to 8.8.  Liver enzymes elevated.  Troponin is normal.  Chest x-ray is normal.  EKG is unchanged from previous.  Results discussed with the patient.  He does state that he continues to drink heavily daily.  He also states that he had a colonoscopy last year and has continued to notice pink to bright red blood in his stools.  He did notice that this morning.  Patient refused rectal exam for guaiac testing. Headache has improved and he is no longer nauseated. Admission discussed with the patient who will agree to stay.  Protonix ordered. [CT]  [id number] Patient accepted for observation. [CT]    Clinical Course User Index [CT] Ami Thornsberry B, FNP     FINAL CLINICAL IMPRESSION(S) / ED DIAGNOSES   Final diagnoses:  Symptomatic anemia  Other migraine without status migrainosus, not intractable     Rx / DC Orders   ED Discharge Orders     None        Note:  This document was prepared using Dragon voice recognition software and may include  unintentional dictation errors.   Chinita Pester, FNP 05/28/22 1609    Willy Eddy, MD 05/29/22 [phone]

## 2022-05-28 NOTE — H&P (Signed)
Ethan Wells    [name] ZYS:[id number] DOB: [birth year] DOA: 05/28/2022 DOS: the patient was seen and examined on 05/28/2022 PCP: [facility]  Patient coming from: Home  Chief Complaint:  Chief Complaint  Patient presents with   Migraine   HPI: Ethan Wells is a 51 y.o. male with medical history significant for CAD with history of STEMI complicated by V. fib arrest during cardiac cath, chronic diastolic heart failure (last known LVEF 55 to 60% from an echo which was done 01/22), history of migraine headache alcohol abuse and polysubstance abuse who presents to the ER for evaluation of a headache. He has a history of migraine headaches but states that this headache feels different.  He describes a throbbing headache involving the occipital as well as frontal lobes rated a 10 x 10 in intensity at its worst associated with nausea, vomiting and photosensitivity.  He normally takes [facility] powders for his headaches but has not been able to take any analgesics due to the nausea and vomiting.  According to the patient he took 2 Aleve tablets on the day of admission and threw them back up. He also complains of pain in the epigastrium as well as midsternal chest pain which started while he was in the ER.  Chest pain is rated a 4 x 10 in intensity at its worst and it is nonradiating.  Denies having any associated nausea, no vomiting, no diaphoresis or palpitations. Epigastric pain according to the patient radiates to his back and is rated a 5 x 10 in intensity at its worst. He admits to daily alcohol use as well as occasional NSAID use. He also admits to passing bright red blood per rectum for several weeks and thinks he may have hemorrhoids.  He denies having any constipation or diarrhea.  He complains of feeling dizzy and lightheaded. He denies having any hematemesis or passage of melena stools. He denies having any fever, no chills, no neck stiffness  or pain, no urinary symptoms, no cough, no leg swelling, no blurred vision no focal deficit. Labs reveal a drop in his hemoglobin from 15.2 g/dl (5 months ago) to 8.8 g/dl.  He also has hypokalemia with potassium of 3.2 Initial troponin is negative and twelve-lead EKG shows normal sinus rhythm with ST and T wave changes in the lateral leads. Chart review shows patient had an EGD in March, 2022 which showed reactive gastropathy and Barrett's esophagus Patient received IV Decadron, Toradol and Reglan for his migraine headache without any improvement.  Review of Systems: As mentioned in the history of present illness. All other systems reviewed and are negative. Past Medical History:  Diagnosis Date   CAD (coronary artery disease)    a. 01/2011 Anterior STEMI/Cath/PCI: LM nl, LAD 100d (3.5x6m Vision BMS placed), LCX 248mRI 50, RCA min irregs, EF 40% w/ apical, inferoapical HK.   Cardiac arrest - ventricular fibrillation    a. In setting of STEMI 01/2011   Chronic pain    [place]   Depression with anxiety    ETOH abuse    a. 6-12 beers/day   GERD (gastroesophageal reflux disease)    Hemorrhoids    Hyperlipidemia    Hypertension    Ischemic cardiomyopathy    a. 06/2011 Echo: EF 45-50%, No rwma   Marijuana abuse    Migraines    Tobacco abuse    a. 1/2 ppd x 26 yrs   Past Surgical History:  Procedure  Laterality Date   COLONOSCOPY  08/09/2021   Procedure: COLONOSCOPY;  Surgeon: Lucilla Lame, MD;  Location[facility] ENDOSCOPY;  Service: Endoscopy;;   CORONARY ANGIOPLASTY WITH STENT PLACEMENT     ESOPHAGOGASTRODUODENOSCOPY (EGD) WITH PROPOFOL N/A 03/01/2021   Procedure: ESOPHAGOGASTRODUODENOSCOPY (EGD) WITH PROPOFOL;  Surgeon: Lin Landsman, MD;  Location: [facility];  Service: Endoscopy;  Laterality: N/A;   ESOPHAGOGASTRODUODENOSCOPY (EGD) WITH PROPOFOL N/A 08/09/2021   Procedure: ESOPHAGOGASTRODUODENOSCOPY (EGD) WITH PROPOFOL;  Surgeon: Lucilla Lame, MD;  Location:  [facility] ENDOSCOPY;  Service: Endoscopy;  Laterality: N/A;   Social History:  reports that he has been smoking cigarettes. He has a 20.50 pack-year smoking history. He has never used smokeless tobacco. He reports current alcohol use. He reports that he does not currently use drugs after having used the following drugs: Cocaine and Marijuana.  Allergies  Allergen Reactions   Penicillins Hives   Other Rash    chives    Family History  Problem Relation Age of Onset   Coronary artery disease Mother        alive   Other Other        2 sisters alive and well   Alcohol abuse Father        died @ 66   Other Paternal Grandmother        PACER    Prior to Admission medications   Medication Sig Start Date End Date Taking? Authorizing Provider  aspirin 81 MG chewable tablet Chew 1 tablet (81 mg total) by mouth daily. 06/07/20   Fritzi Mandes, MD  gabapentin (NEURONTIN) 300 MG capsule Take 900 mg by mouth at bedtime.    [provider]  hydrOXYzine (ATARAX/VISTARIL) 25 MG tablet Take 25-50 mg by mouth See admin instructions. Take 1 tablet ('25mg'$ ) by mouth 3 times daily as needed for anxiety and take 2 tablets ('50mg'$ ) by mouth at bedtime as needed for sleep or anxiety    [provider]  Multiple Vitamin (MULTIVITAMIN WITH MINERALS) TABS tablet Take 1 tablet by mouth daily. 08/10/21   Little Ishikawa, MD  Omega-3 Fatty Acids (FISH OIL) 1000 MG CAPS Take 2 capsules by mouth daily.    [provider]  ondansetron (ZOFRAN ODT) 4 MG disintegrating tablet Take 1 tablet (4 mg total) by mouth every 8 (eight) hours as needed for nausea or vomiting. 09/05/21   Vladimir Crofts, MD  pantoprazole (PROTONIX) 40 MG tablet Take 40 mg by mouth daily.    [provider]  rosuvastatin (CRESTOR) 40 MG tablet Take 40 mg by mouth daily.    [provider]  sucralfate (CARAFATE) 1 GM/10ML suspension Take 10 mLs (1 g total) by mouth 4 (four) times daily -  with meals and at bedtime.  08/10/21   Little Ishikawa, MD    Wells Exam: Vitals:   05/28/22 1331 05/28/22 1349 05/28/22 1600  BP: 127/75  122/68  Pulse: (!) 101  (!) 107  Resp: 19  17  Temp: 98.3 F (36.8 C)    TempSrc: Oral    SpO2: 99%  100%  Weight:  90.7 kg   Height:  '5\' 5"'$  (1.651 m)    Wells Exam Vitals and nursing note reviewed.  Constitutional:      Appearance: Normal appearance.  HENT:     Head: Normocephalic and atraumatic.     Nose: Nose normal.     Mouth/Throat:     Mouth: Mucous membranes are moist.  Eyes:     Pupils: Pupils are  equal, round, and reactive to light.  Cardiovascular:     Rate and Rhythm: Normal rate and regular rhythm.  Pulmonary:     Effort: Pulmonary effort is normal.     Breath sounds: Normal breath sounds.  Abdominal:     General: Abdomen is flat.     Palpations: Abdomen is soft.     Tenderness: There is abdominal tenderness.     Comments: Epigastrium, right upper quadrant  Musculoskeletal:        General: Normal range of motion.     Cervical back: Normal range of motion and neck supple.  Skin:    General: Skin is warm and dry.  Neurological:     General: No focal deficit present.     Mental Status: He is alert and oriented to person, place, and time.  Psychiatric:        Mood and Affect: Mood normal.        Behavior: Behavior normal.     Data Reviewed: Relevant notes from primary care and specialist visits, past discharge summaries as available in EHR, including Care Everywhere. Prior diagnostic testing as pertinent to current admission diagnoses Updated medications and problem lists for reconciliation ED course, including vitals, labs, imaging, treatment and response to treatment Triage notes, nursing and pharmacy notes and ED provider's notes Notable results as noted in HPI Labs reviewed.  Sodium 138, potassium 3.2, chloride 106, bicarb 23, glucose 113, BUN 6, creatinine 0.53, calcium 8.7, total protein 7.8, albumin 3.1, AST 138, ALT 47,  alkaline phosphatase 287, total bilirubin 2.0, troponin 9, alcohol level less than 10, white count 6.0, hemoglobin 8.8 compared to baseline of 15, hematocrit 27.7, RDW 17.3, platelet count 186 Chest x-ray reviewed by me shows no evidence of acute cardiopulmonary disease Twelve-lead EKG reviewed by me shows normal sinus rhythm with nonspecific ST and T wave changes in the lateral leads There are no new results to review at this time.  Assessment and Plan: * Chest pain Patient with a history of coronary artery disease who presents to the ER for evaluation of chest pain Chest pain is likely secondary to anemia to rule out acute coronary syndrome Initial troponin is negative Cycle cardiac enzymes Hold aspirin due to significant anemia Continue high intensity statins  CAD (coronary artery disease) Treatment as outlined in 1  GI bleed Patient noted to have a drop in his H&H from 15 g/dl (5 months ago) to 8.8 g/dl He states that he has bright red blood per rectum over several weeks Also concern for possible NSAID induced gastritis Obtain iron studies Monitor H&H and transfuse for hemoglobin less than 7 Place patient on IV Protonix Consult GI  ABLA (acute blood loss anemia) Treatment as outlined in 2  Esophageal reflux Continue IV PPI  Hypokalemia Most likely secondary to GI losses from nausea and vomiting Supplement potassium Check magnesium levels  TOBACCO ABUSE Smoking cessation discussed with patient in detail We will place patient on a nicotine transdermal patch 14 mg daily  Headache Patient has a history of migraine headaches and presents for evaluation of a 4-day history of headaches which he states are different from his usual migraines and rated 10 x 10 in intensity at its worst. Received IV Toradol, Decadron and Reglan in the ER without any significant improvement Obtain CT scan of the head without contrast due to change in the nature of his headache Place patient on  Fioricet as needed      Advance Care Planning:   Code  Status: Full Code   Consults: Gastroenterology  Family Communication: 50% of time was spent discussing patient's condition and plan of care with him at the bedside.  All questions and concerns have been addressed.  He verbalizes understanding and agrees with the plan.  Severity of Illness: The appropriate patient status for this patient is OBSERVATION. Observation status is judged to be reasonable and necessary in order to provide the required intensity of service to ensure the patient's safety. The patient's presenting symptoms, Wells exam findings, and initial radiographic and laboratory data in the context of their medical condition is felt to place them at decreased risk for further clinical deterioration. Furthermore, it is anticipated that the patient will be medically stable for discharge from the hospital within 2 midnights of admission.   Author: Collier Bullock, MD 05/28/2022 4:52 PM  For on call review [email or web address].

## 2022-05-28 NOTE — Assessment & Plan Note (Signed)
Patient has a history of migraine headaches and presents for evaluation of a 4-day history of headaches which he states are different from his usual migraines and rated 10 x 10 in intensity at its worst. Received IV Toradol, Decadron and Reglan in the ER without any significant improvement Obtain CT scan of the head without contrast due to change in the nature of his headache Place patient on Fioricet as needed

## 2022-05-28 NOTE — Assessment & Plan Note (Addendum)
Patient with a history of coronary artery disease who presents to the ER for evaluation of chest pain Chest pain is likely secondary to anemia to rule out acute coronary syndrome Initial troponin is negative Cycle cardiac enzymes Hold aspirin due to significant anemia Continue high intensity statins

## 2022-05-28 NOTE — Assessment & Plan Note (Signed)
Continue IV PPI. ?

## 2022-05-28 NOTE — Assessment & Plan Note (Signed)
Smoking cessation discussed with patient in detail We will place patient on a nicotine transdermal patch 14 mg daily 

## 2022-05-28 NOTE — ED Triage Notes (Signed)
Pt comes with c/o migraine. Pt states this started week ago. Pt states it has gotten worse today. Pt states 10/10 pain. Pt states hx of migraines.

## 2022-05-28 NOTE — Assessment & Plan Note (Signed)
Treatment as outlined in 2 

## 2022-05-28 NOTE — ED Notes (Signed)
US at bedside

## 2022-05-28 NOTE — ED Notes (Signed)
This RN attempted IV in left [facility] with no success '

## 2022-05-28 NOTE — Assessment & Plan Note (Signed)
Patient noted to have a drop in his H&H from 15 g/dl (5 months ago) to 8.8 g/dl He states that he has bright red blood per rectum over several weeks Also concern for possible NSAID induced gastritis Obtain iron studies Monitor H&H and transfuse for hemoglobin less than 7 Place patient on IV Protonix Consult GI

## 2022-05-28 NOTE — Assessment & Plan Note (Signed)
Most likely secondary to GI losses from nausea and vomiting Supplement potassium Check magnesium levels

## 2022-05-29 DIAGNOSIS — K3189 Other diseases of stomach and duodenum: Secondary | ICD-10-CM | POA: Diagnosis present

## 2022-05-29 DIAGNOSIS — I252 Old myocardial infarction: Secondary | ICD-10-CM | POA: Diagnosis not present

## 2022-05-29 DIAGNOSIS — K921 Melena: Secondary | ICD-10-CM | POA: Diagnosis not present

## 2022-05-29 DIAGNOSIS — R079 Chest pain, unspecified: Secondary | ICD-10-CM

## 2022-05-29 DIAGNOSIS — G43909 Migraine, unspecified, not intractable, without status migrainosus: Secondary | ICD-10-CM | POA: Diagnosis present

## 2022-05-29 DIAGNOSIS — Z8674 Personal history of sudden cardiac arrest: Secondary | ICD-10-CM | POA: Diagnosis not present

## 2022-05-29 DIAGNOSIS — F1910 Other psychoactive substance abuse, uncomplicated: Secondary | ICD-10-CM | POA: Diagnosis present

## 2022-05-29 DIAGNOSIS — I5032 Chronic diastolic (congestive) heart failure: Secondary | ICD-10-CM | POA: Diagnosis present

## 2022-05-29 DIAGNOSIS — F17210 Nicotine dependence, cigarettes, uncomplicated: Secondary | ICD-10-CM | POA: Diagnosis present

## 2022-05-29 DIAGNOSIS — D62 Acute posthemorrhagic anemia: Secondary | ICD-10-CM

## 2022-05-29 DIAGNOSIS — G8929 Other chronic pain: Secondary | ICD-10-CM | POA: Diagnosis present

## 2022-05-29 DIAGNOSIS — K2980 Duodenitis without bleeding: Secondary | ICD-10-CM | POA: Diagnosis present

## 2022-05-29 DIAGNOSIS — I255 Ischemic cardiomyopathy: Secondary | ICD-10-CM | POA: Diagnosis present

## 2022-05-29 DIAGNOSIS — Z955 Presence of coronary angioplasty implant and graft: Secondary | ICD-10-CM | POA: Diagnosis not present

## 2022-05-29 DIAGNOSIS — I110 Hypertensive heart disease with heart failure: Secondary | ICD-10-CM | POA: Diagnosis present

## 2022-05-29 DIAGNOSIS — Z880 Allergy status to penicillin: Secondary | ICD-10-CM | POA: Diagnosis not present

## 2022-05-29 DIAGNOSIS — D649 Anemia, unspecified: Secondary | ICD-10-CM | POA: Diagnosis not present

## 2022-05-29 DIAGNOSIS — K2100 Gastro-esophageal reflux disease with esophagitis, without bleeding: Secondary | ICD-10-CM | POA: Diagnosis not present

## 2022-05-29 DIAGNOSIS — K219 Gastro-esophageal reflux disease without esophagitis: Secondary | ICD-10-CM | POA: Diagnosis present

## 2022-05-29 DIAGNOSIS — F419 Anxiety disorder, unspecified: Secondary | ICD-10-CM | POA: Diagnosis present

## 2022-05-29 DIAGNOSIS — D500 Iron deficiency anemia secondary to blood loss (chronic): Secondary | ICD-10-CM | POA: Diagnosis present

## 2022-05-29 DIAGNOSIS — Z888 Allergy status to other drugs, medicaments and biological substances status: Secondary | ICD-10-CM | POA: Diagnosis not present

## 2022-05-29 DIAGNOSIS — E785 Hyperlipidemia, unspecified: Secondary | ICD-10-CM | POA: Diagnosis present

## 2022-05-29 DIAGNOSIS — Z8249 Family history of ischemic heart disease and other diseases of the circulatory system: Secondary | ICD-10-CM | POA: Diagnosis not present

## 2022-05-29 DIAGNOSIS — E876 Hypokalemia: Secondary | ICD-10-CM | POA: Diagnosis present

## 2022-05-29 DIAGNOSIS — I959 Hypotension, unspecified: Secondary | ICD-10-CM | POA: Diagnosis not present

## 2022-05-29 DIAGNOSIS — K766 Portal hypertension: Secondary | ICD-10-CM | POA: Diagnosis present

## 2022-05-29 DIAGNOSIS — I25119 Atherosclerotic heart disease of native coronary artery with unspecified angina pectoris: Secondary | ICD-10-CM | POA: Diagnosis present

## 2022-05-29 DIAGNOSIS — K2211 Ulcer of esophagus with bleeding: Secondary | ICD-10-CM | POA: Diagnosis present

## 2022-05-29 LAB — BASIC METABOLIC PANEL
Anion gap: 6 (ref 5–15)
BUN: 7 mg/dL (ref 6–20)
CO2: 19 mmol/L — ABNORMAL LOW (ref 22–32)
Calcium: 8.1 mg/dL — ABNORMAL LOW (ref 8.9–10.3)
Chloride: 108 mmol/L (ref 98–111)
Creatinine, Ser: 0.5 mg/dL — ABNORMAL LOW (ref 0.61–1.24)
GFR, Estimated: >60 mL/min (ref >60)
Glucose, Bld: 225 mg/dL — ABNORMAL HIGH (ref 70–99)
Potassium: 3.5 mmol/L (ref 3.5–5.1)
Sodium: 133 mmol/L — ABNORMAL LOW (ref 135–145)

## 2022-05-29 LAB — CBC
HCT: 24.4 % — ABNORMAL LOW (ref 39.0–52.0)
Hemoglobin: 7.5 g/dL — ABNORMAL LOW (ref 13.0–17.0)
MCH: 29.4 pg (ref 26.0–34.0)
MCHC: 30.7 g/dL (ref 30.0–36.0)
MCV: 95.7 fL (ref 80.0–100.0)
Platelets: 143 10*3/uL — ABNORMAL LOW (ref 150–400)
RBC: 2.55 MIL/uL — ABNORMAL LOW (ref 4.22–5.81)
RDW: 17 % — ABNORMAL HIGH (ref 11.5–15.5)
WBC: 4.6 10*3/uL (ref 4.0–10.5)
nRBC: 0 % (ref 0.0–0.2)

## 2022-05-29 LAB — URINE DRUG SCREEN, QUALITATIVE (ARMC ONLY)
Amphetamines, Ur Screen: NOT DETECTED
Barbiturates, Ur Screen: POSITIVE — AB
Benzodiazepine, Ur Scrn: POSITIVE — AB
Cannabinoid 50 Ng, Ur ~~LOC~~: NOT DETECTED
Cocaine Metabolite,Ur ~~LOC~~: NOT DETECTED
MDMA (Ecstasy)Ur Screen: NOT DETECTED
Methadone Scn, Ur: NOT DETECTED
Opiate, Ur Screen: POSITIVE — AB
Phencyclidine (PCP) Ur S: NOT DETECTED
Tricyclic, Ur Screen: NOT DETECTED

## 2022-05-29 LAB — FERRITIN: Ferritin: 29 ng/mL (ref 24–336)

## 2022-05-29 LAB — PREPARE RBC (CROSSMATCH)

## 2022-05-29 LAB — IRON AND TIBC
Iron: 57 ug/dL (ref 45–182)
Saturation Ratios: 13 % — ABNORMAL LOW (ref 17.9–39.5)
TIBC: 435 ug/dL (ref 250–450)
UIBC: 378 ug/dL

## 2022-05-29 LAB — TROPONIN I (HIGH SENSITIVITY): Troponin I (High Sensitivity): 11 ng/L (ref <18)

## 2022-05-29 LAB — HEMOGLOBIN AND HEMATOCRIT, BLOOD
HCT: 22.9 % — ABNORMAL LOW (ref 39.0–52.0)
HCT: 25.8 % — ABNORMAL LOW (ref 39.0–52.0)
Hemoglobin: 7.2 g/dL — ABNORMAL LOW (ref 13.0–17.0)
Hemoglobin: 8.1 g/dL — ABNORMAL LOW (ref 13.0–17.0)

## 2022-05-29 LAB — GLUCOSE, CAPILLARY: Glucose-Capillary: 257 mg/dL — ABNORMAL HIGH (ref 70–99)

## 2022-05-29 LAB — HIV ANTIBODY (ROUTINE TESTING W REFLEX): HIV Screen 4th Generation wRfx: NONREACTIVE

## 2022-05-29 MED ORDER — PANTOPRAZOLE SODIUM 40 MG IV SOLR
40.0000 mg | Freq: Two times a day (BID) | INTRAVENOUS | Status: DC
  Administered 2022-05-29 – 2022-05-31 (×4): 40 mg via INTRAVENOUS
  Filled 2022-05-29 (×4): qty 10

## 2022-05-29 MED ORDER — PROCHLORPERAZINE EDISYLATE 10 MG/2ML IJ SOLN
10.0000 mg | Freq: Once | INTRAMUSCULAR | Status: AC
  Administered 2022-05-29: 10 mg via INTRAVENOUS
  Filled 2022-05-29: qty 2

## 2022-05-29 MED ORDER — NITROGLYCERIN 0.4 MG SL SUBL
0.4000 mg | SUBLINGUAL_TABLET | SUBLINGUAL | Status: DC | PRN
  Administered 2022-05-29: 0.4 mg via SUBLINGUAL
  Filled 2022-05-29: qty 1

## 2022-05-29 MED ORDER — DIPHENHYDRAMINE HCL 50 MG/ML IJ SOLN
25.0000 mg | Freq: Once | INTRAMUSCULAR | Status: AC
  Administered 2022-05-29: 25 mg via INTRAVENOUS
  Filled 2022-05-29: qty 1

## 2022-05-29 MED ORDER — DEXAMETHASONE SODIUM PHOSPHATE 10 MG/ML IJ SOLN
8.0000 mg | Freq: Once | INTRAMUSCULAR | Status: AC
  Administered 2022-05-29: 8 mg via INTRAVENOUS
  Filled 2022-05-29: qty 1

## 2022-05-29 MED ORDER — ORAL CARE MOUTH RINSE
15.0000 mL | Freq: Two times a day (BID) | OROMUCOSAL | Status: DC
  Administered 2022-05-30 – 2022-05-31 (×3): 15 mL via OROMUCOSAL

## 2022-05-29 MED ORDER — MORPHINE SULFATE (PF) 2 MG/ML IV SOLN
2.0000 mg | Freq: Once | INTRAVENOUS | Status: AC
  Administered 2022-05-29: 2 mg via INTRAVENOUS
  Filled 2022-05-29: qty 1

## 2022-05-29 MED ORDER — METOPROLOL TARTRATE 5 MG/5ML IV SOLN: 5.0000 mg | Freq: Once | INTRAVENOUS | Status: DC

## 2022-05-29 MED ORDER — METOPROLOL TARTRATE 5 MG/5ML IV SOLN
INTRAVENOUS | Status: AC
  Administered 2022-05-29: 5 mg
  Filled 2022-05-29: qty 5

## 2022-05-29 MED ORDER — SODIUM CHLORIDE 0.9% IV SOLUTION: Freq: Once | INTRAVENOUS | Status: AC

## 2022-05-29 NOTE — Plan of Care (Signed)
Patient educated on blood administration and s/s to look out for and to report to me while getting blood.  Problem: Education: Goal: Knowledge of General Education information will improve Description: Including pain rating scale, medication(s)/side effects and non-pharmacologic comfort measures Outcome: Progressing

## 2022-05-29 NOTE — TOC Initial Note (Signed)
Transition of Care [facility]) - Initial/Assessment Note    Patient Details  Name: Ethan Wells MRN: [id number] Date of Birth: 1971-07-16  Transition of Care [facility]) CM/SW Contact:    Candie Chroman, LCSW Phone Number: 05/29/2022, 2:02 PM  Clinical Narrative:   CSW met with patient. No supports at bedside. CSW introduced role and inquired about interest in SA resources. Patient said he needs a mental evaluation for court. Told him there are counseling resources included and to call to see which agency can provide that service and can take his insurance. No further concerns. CSW will continue to follow patient for support and facilitate return home when stable.               Expected Discharge Plan: Home/Self Care Barriers to Discharge: Continued Medical Work up   Patient Goals and CMS Choice        Expected Discharge Plan and Services Expected Discharge Plan: Home/Self Care     Post Acute Care Choice: NA Living arrangements for the past 2 months: Single Family Home                                      Prior Living Arrangements/Services Living arrangements for the past 2 months: Single Family Home   Patient language and need for interpreter reviewed:: Yes Do you feel safe going back to the place where you live?: Yes            Criminal Activity/Legal Involvement Pertinent to Current Situation/Hospitalization: No - Comment as needed  Activities of Daily Living      Permission Sought/Granted                  Emotional Assessment Appearance:: Appears stated age Attitude/Demeanor/Rapport: Lethargic Affect (typically observed): Accepting Orientation: : Oriented to Self, Oriented to Place, Oriented to  Time, Oriented to Situation Alcohol / Substance Use: Alcohol Use, Illicit Drugs Psych Involvement: No (comment)  Admission diagnosis:  Chest pain [R07.9] Symptomatic anemia [D64.9] Other migraine without status migrainosus, not intractable  [G43.809] Acute on chronic blood loss anemia [D62] Patient Active Problem List   Diagnosis Date Noted   Acute on chronic blood loss anemia 05/29/2022   GI bleed 05/28/2022   Non-intractable vomiting    Epigastric abdominal pain    Polyp of ascending colon    ABLA (acute blood loss anemia) 08/07/2021   Acute gastritis    Hyperbilirubinemia    Elevated troponin    Notalgia    NSTEMI (non-ST elevated myocardial infarction) ([place]) 12/19/2020   History of ST elevation myocardial infarction (STEMI) 12/19/2020   Alcohol use disorder, moderate, dependence ([place]) 12/19/2020   Depression with anxiety 10/05/2020   Esophageal reflux 10/05/2020   Headache 10/05/2020   Hyperlipidemia 10/05/2020   Hyponatremia 06/04/2020   Hypomagnesemia 50/02/7047   Chronic systolic CHF (congestive heart failure) ([place]) 06/04/2020   Leukocytosis 06/04/2020   Abdominal pain 06/04/2020   Hypokalemia 06/04/2020   Ischemic cardiomyopathy    Polysubstance abuse ([place])    Chest pain 07/12/2019   Abdominal pain, RUQ 05/25/2018   Chest pain, mid sternal 11/17/2012   Cocaine abuse ([place])    Marijuana abuse    TOBACCO ABUSE 02/26/2011   HYPERTENSION, BENIGN 02/26/2011   CAD (coronary artery disease) 02/26/2011   Other specified forms of chronic ischemic heart disease 02/26/2011   PCP:  [facility] [place]:   [id number] [place],  [place] - [address] [place] [address] Phone: [phone] Fax: [phone]     Social Determinants of Health (SDOH) Interventions    Readmission Risk Interventions     No data to display

## 2022-05-29 NOTE — Progress Notes (Signed)
       CROSS COVER NOTE  NAME: Ethan Wells MRN: [id number] DOB : July 01, 1971    Date of Service   05/29/22  HPI/Events of Note   Mr. Ethan Wells is a 51 year old male with medical history significant for CAD with history of STEMI complicated by V-fib arrest during cardiac cath, chronic diastolic heart failure last EF 55 to 60% in January 2022, history of migraine headaches alcohol use disorder polysubstance use disorder presents to the ED for evaluation of headache and additionally reports chest pain and epigastric pain in ED.  His hemoglobin was found to be 8.8 which is a significant decrease from 15 approximately 5 months ago and there is concern that demand ischemia is contributing to his chest pain.  [name]' urine tox was also cocaine positive in [facility] ED.  At bedside tonight he is reporting 8 out of 10 chest pressure that is not new, he reports the pain is slightly worse than in ED when he felt it was a 5/10. He is tachycardic HR 139 BP 152/110 and anxious appearing. He is requesting IV morphine for the pain.  CXR in ED showed no acute findings.  Interventions   Plan: EKG 5 mg IV metoprolol PRN Nitroglycerin CBC, Troponin Morphine '2mg'$  IV x1       Neomia Glass DNP, MHA, FNP-BC Nurse Practitioner [facility] Pager [phone]

## 2022-05-29 NOTE — Consult Note (Signed)
Ethan Darby, MD [address]  [place]  [place], [place] [address]  Main: [phone]  Fax: [phone] Pager: [phone]   Consultation  Referring Provider:     No ref. provider found Primary Care Physician:  [facility] Primary Gastroenterologist:  Dr. Sherri Sear       Reason for Consultation: Anemia  Date of Admission:  05/28/2022 Date of Consultation:  05/29/2022         HPI:   Ethan Wells is a 51 y.o. male history of coronary disease, STEMI complicated by V-fib arrest during cardiac cath, history of diastolic heart failure, EF of 55 to 60%, history of alcohol abuse, polysubstance abuse presented with headache associated with nausea, vomiting.  Patient has history of chronic BC powder intake.  He is also complaining of upper abdominal pain, midsternal chest pain without any nausea or vomiting.  He reports that he has been having rectal bleeding for last several weeks and he thinks he may have hemorrhoids.  He does report feeling dizzy and lightheadedness.  He denies any melena, hematemesis.  Patient was hemodynamically stable in the ER.  Labs were significant for AST 138, ALT 47, alkaline phosphatase 287, T. bili 2.0, BUN/creatinine 6/0.53, serum lipase normal, troponins negative, hemoglobin 8.8 dropped from 15.22 months ago.  Serum alcohol levels undetectable.  Patient received blood transfusion as his hemoglobin further dropped to 7.2 and he was mildly hypotensive.  When I interviewed the patient he was complaining of upper abdominal pain.  He underwent right upper quadrant ultrasound which revealed marked hepatic steatosis.  CT head without contrast was unrevealing.  Patient does have history of internal hemorrhoids when he had similar presentation in August 2022, underwent upper endoscopy and colonoscopy which revealed internal hemorrhoids only  NSAIDs: BC powder, Aleve  Antiplts/Anticoagulants/Anti thrombotics: None  GI Procedures:   EGD and colonoscopy 08/09/2021 - Z-line irregular. - Normal stomach. - Normal examined duodenum. - No specimens collected.  - Two 5 to 7 mm polyps in the ascending colon, removed with a cold snare. Resected and retrieved. - Non-bleeding internal hemorrhoids.  Past Medical History:  Diagnosis Date   CAD (coronary artery disease)    a. 01/2011 Anterior STEMI/Cath/PCI: LM nl, LAD 100d (3.5x79m Vision BMS placed), LCX 270mRI 50, RCA min irregs, EF 40% w/ apical, inferoapical HK.   Cardiac arrest - ventricular fibrillation    a. In setting of STEMI 01/2011   Chronic pain    C[place]   Depression with anxiety    ETOH abuse    a. 6-12 beers/day   GERD (gastroesophageal reflux disease)    Hemorrhoids    Hyperlipidemia    Hypertension    Ischemic cardiomyopathy    a. 06/2011 Echo: EF 45-50%, No rwma   Marijuana abuse    Migraines    Tobacco abuse    a. 1/2 ppd x 26 yrs    Past Surgical History:  Procedure Laterality Date   COLONOSCOPY  08/09/2021   Procedure: COLONOSCOPY;  Surgeon: WoLucilla LameMD;  Location: [facility] ENDOSCOPY;  Service: Endoscopy;;   CORONARY ANGIOPLASTY WITH STENT PLACEMENT     ESOPHAGOGASTRODUODENOSCOPY (EGD) WITH PROPOFOL N/A 03/01/2021   Procedure: ESOPHAGOGASTRODUODENOSCOPY (EGD) WITH PROPOFOL;  Surgeon: VaLin LandsmanMD;  [facility]: [facility];  Laterality: N/A;   ESOPHAGOGASTRODUODENOSCOPY (EGD) WITH PROPOFOL N/A 08/09/2021   Procedure: ESOPHAGOGASTRODUODENOSCOPY (EGD) WITH PROPOFOL;  Surgeon: WoLucilla LameMD;  [facility];  Service: Endoscopy;  Laterality: N/A;    Current Facility-Administered Medications:    acetaminophen (TYLENOL) tablet 650 mg, 650 mg, Oral, Q6H PRN, 650 mg at 05/29/22 1309 **OR** acetaminophen (TYLENOL) suppository 650 mg, 650 mg, Rectal, Q6H PRN, Agbata, Tochukwu, MD   atorvastatin (LIPITOR) tablet 40 mg, 40 mg, Oral, Daily, Agbata, Tochukwu, MD, 40 mg at 05/29/22 0930    butalbital-acetaminophen-caffeine (FIORICET) 50-325-40 MG per tablet 2 tablet, 2 tablet, Oral, Q6H PRN, Agbata, Tochukwu, MD, 2 tablet at 05/29/22 0818   dexamethasone (DECADRON) injection 8 mg, 8 mg, Intravenous, Once, Sreenath, Sudheer B, MD   diphenhydrAMINE (BENADRYL) injection 25 mg, 25 mg, Intravenous, Once, Sreenath, Sudheer B, MD   folic acid (FOLVITE) tablet 1 mg, 1 mg, Oral, Daily, Agbata, Tochukwu, MD, 1 mg at 05/29/22 0930   LORazepam (ATIVAN) tablet 1-4 mg, 1-4 mg, Oral, Q1H PRN, 1 mg at 05/29/22 0003 **OR** LORazepam (ATIVAN) injection 1-4 mg, 1-4 mg, Intravenous, Q1H PRN, Agbata, Tochukwu, MD, 2 mg at 05/29/22 0109   MEDLINE mouth rinse, 15 mL, Mouth Rinse, BID, Sreenath, Sudheer B, MD   metoprolol tartrate (LOPRESSOR) injection 5 mg, 5 mg, Intravenous, Once, Foust, Katy L, NP   multivitamin with minerals tablet 1 tablet, 1 tablet, Oral, Daily, Agbata, Tochukwu, MD, 1 tablet at 05/29/22 0934   nicotine (NICODERM CQ - dosed in mg/24 hours) patch 14 mg, 14 mg, Transdermal, Daily, Agbata, Tochukwu, MD, 14 mg at 05/29/22 0934   nitroGLYCERIN (NITROSTAT) SL tablet 0.4 mg, 0.4 mg, Sublingual, Q5 min PRN, Foust, Katy L, NP, 0.4 mg at 05/29/22 0148   ondansetron (ZOFRAN) tablet 4 mg, 4 mg, Oral, Q6H PRN **OR** ondansetron (ZOFRAN) injection 4 mg, 4 mg, Intravenous, Q6H PRN, Agbata, Tochukwu, MD, 4 mg at 05/29/22 0119   pantoprazole (PROTONIX) injection 40 mg, 40 mg, Intravenous, Q12H, Deneshia Zucker, Tally Due, MD   prochlorperazine (COMPAZINE) injection 10 mg, 10 mg, Intravenous, Once, Sreenath, Sudheer B, MD   thiamine tablet 100 mg, 100 mg, Oral, Daily, 100 mg at 05/29/22 0930 **OR** thiamine (B-1) injection 100 mg, 100 mg, Intravenous, Daily, Agbata, Tochukwu, MD   Family History  Problem Relation Age of Onset   Coronary artery disease Mother        alive   Other Other        2 sisters alive and well   Alcohol abuse Father        died @ 30   Other Paternal Grandmother        PACER      Social History   Tobacco Use   Smoking status: Every Day    Packs/day: 0.50    Years: 41.00    Total pack years: 20.50    Types: Cigarettes   Smokeless tobacco: Never   Tobacco comments:    started age 73  Vaping Use   Vaping Use: Never used  Substance Use Topics   Alcohol use: Yes    Comment: at least a 6 pack of beer daily and often a 12 pack   Drug use: Not Currently    Types: Cocaine, Marijuana    Comment: (02/22/21 - pt reports none last 6 mos) cocaine used several weeks ago; marijuana today    Allergies as of 05/28/2022 - Review Complete 05/28/2022  Allergen Reaction Noted   Penicillins Hives 02/26/2011   Other Rash 02/22/2021    Review of Systems:    All systems reviewed and negative except where noted in HPI.   Physical Exam:  Vital signs in last 24 hours: Temp:  [  97.7 F (36.5 C)-99.2 F (37.3 C)] 98.4 F (36.9 C) (06/14 1504) Pulse Rate:  [85-139] 88 (06/14 1618) Resp:  [15-25] 20 (06/14 1618) BP: (91-152)/(49-110) 97/58 (06/14 1618) SpO2:  [96 %-100 %] 96 % (06/14 1618) Last BM Date : 05/28/22 General:   Pleasant, cooperative in NAD Head:  Normocephalic and atraumatic. Eyes:   No icterus.   Conjunctiva pink. PERRLA. Ears:  Normal auditory acuity. Neck:  Supple; no masses or thyroidomegaly Lungs: Respirations even and unlabored. Lungs clear to auscultation bilaterally.   No wheezes, crackles, or rhonchi.  Heart:  Regular rate and rhythm;  Without murmur, clicks, rubs or gallops Abdomen:  Soft, nondistended, mild epigastric tenderness. Normal bowel sounds. No appreciable masses or hepatomegaly.  No rebound or guarding.  Rectal:  Not performed. Msk:  Symmetrical without gross deformities.  Strength generalized weakness Extremities:  Without edema, cyanosis or clubbing. Neurologic:  Alert and oriented x3;  grossly normal neurologically. Skin:  Intact without significant lesions or rashes. Psych:  Alert and cooperative. Normal affect.  LAB RESULTS:     Latest Ref Rng & Units 05/29/2022    3:14 PM 05/29/2022    8:13 AM 05/29/2022    2:12 AM  CBC  WBC 4.0 - 10.5 K/uL   4.6   Hemoglobin 13.0 - 17.0 g/dL 8.1  7.2  7.5   Hematocrit 39.0 - 52.0 % 25.8  22.9  24.4   Platelets 150 - 400 K/uL   143     BMET    Latest Ref Rng & Units 05/29/2022    2:12 AM 05/28/2022    2:15 PM 12/21/2021   11:22 PM  BMP  Glucose 70 - 99 mg/dL 225  113  114   BUN 6 - 20 mg/dL 7  6  <5   Creatinine 0.61 - 1.24 mg/dL 0.50  0.53  0.55   Sodium 135 - 145 mmol/L 133  138  132   Potassium 3.5 - 5.1 mmol/L 3.5  3.2  2.9   Chloride 98 - 111 mmol/L 108  106  99   CO2 22 - 32 mmol/L _0 Calcium 8.9 - 10.3 mg/dL 8.1  8.7  9.3     LFT    Latest Ref Rng & Units 05/28/2022    2:15 PM 12/21/2021   11:22 PM 09/05/2021    5:24 PM  Hepatic Function  Total Protein 6.5 - 8.1 g/dL 7.8  9.0  7.7   Albumin 3.5 - 5.0 g/dL 3.1  3.5  2.8   AST 15 - 41 U/L 138  86  85   ALT 0 - 44 U/L 47  41  36   Alk Phosphatase 38 - 126 U/L 287  199  222   Total Bilirubin 0.3 - 1.2 mg/dL 2.0  0.9  1.2   Bilirubin, Direct 0.0 - 0.2 mg/dL  0.2       STUDIES: CT HEAD WO CONTRAST (5MM)  Result Date: 05/28/2022 CLINICAL DATA:  Positional headache. New or worsening. Migraine. Started a week ago. Got worse today. Ten 10 pain. Patient reports history of migraines. EXAM: CT HEAD WITHOUT CONTRAST TECHNIQUE: Contiguous axial images were obtained from the base of the skull through the vertex without intravenous contrast. RADIATION DOSE REDUCTION: This exam was performed according to the departmental dose-optimization program which includes automated exposure control, adjustment of the mA and/or kV according to patient size and/or use of iterative reconstruction technique. COMPARISON:  CT brain 03/07/2011 FINDINGS: Brain:  There is mild cortical atrophy, within normal limits for patient age. The ventricles are normal in configuration. The basilar cisterns are patent. No mass, mass effect, or midline  shift. No acute intracranial hemorrhage is seen. No abnormal extra-axial fluid collection. Preservation of the normal cortical gray-white interface without CT evidence of an acute major vascular territorial cortical based infarction. Vascular: No hyperdense vessel or unexpected calcification. Skull: Normal. Negative for fracture or focal lesion. Sinuses/Orbits: The visualized orbits are unremarkable. The visualized paranasal sinuses and mastoid air cells are clear. Other: None. IMPRESSION: No acute intracranial process. Electronically Signed   By: Yvonne Kendall M.D.   On: 05/28/2022 18:14   US Abdomen Limited RUQ (LIVER/GB)  Result Date: 05/28/2022 CLINICAL DATA:  [id number].  Abdominal pain. EXAM: ULTRASOUND ABDOMEN LIMITED RIGHT UPPER QUADRANT COMPARISON:  None Available. FINDINGS: Gallbladder: No gallstones or wall thickening visualized. No sonographic Murphy sign noted by sonographer. Common bile duct: Diameter: 4 mm in proximal diameter Liver: Hepatic parenchymal echogenicity is diffusely increased and there is poor acoustic through transmission in keeping with severe hepatic steatosis. Parenchymal changes appear progressive since prior examination. This limits the evaluation for detection of focal intrahepatic masses, though no definite focal intrahepatic masses are seen and there is no intrahepatic biliary ductal dilation. portal vein is patent on color Doppler imaging with normal direction of blood flow towards the liver. Other: None. IMPRESSION: Marked hepatic steatosis, progressive since prior examination. Electronically Signed   By: Fidela Salisbury M.D.   On: 05/28/2022 17:19   DG Chest 1 View  Result Date: 05/28/2022 CLINICAL DATA:  Provided history: Chest pain. EXAM: CHEST  1 VIEW COMPARISON:  Prior chest radiographs 12/21/2021 and earlier. Chest CT 08/07/2021. FINDINGS: Heart size at the upper limits of normal. No appreciable airspace consolidation or pulmonary edema. No evidence of pleural effusion  or pneumothorax. No acute bony abnormality identified. IMPRESSION: No evidence of acute cardiopulmonary abnormality. Electronically Signed   By: Kellie Simmering D.O.   On: 05/28/2022 14:45      Impression / Plan:   Ethan Wells is a 51 y.o. male with history of alcohol abuse, polysubstance abuse, history of STEMI, V-fib arrest, diastolic heart failure presented with acute blood loss anemia secondary to hematochezia  Hematochezia with acute blood loss anemia and upper abdominal pain Normal BUN/creatinine Recommend EGD tomorrow for further evaluation.  If this is negative, recommend sigmoidoscopy and hemorrhoid banding Continue Protonix 40 mg IV twice daily Monitor CBC closely to maintain hemoglobin above 7 Recommend parenteral iron therapy Okay with full liquid diet N.p.o. effective 5 AM tomorrow  Alcoholic liver disease without evidence of acute liver failure or cirrhosis of liver Ultrasound revealed mild steatohepatosis Monitor LFTs closely Patient will need close follow-up with GI as outpatient  Thank you for involving me in the care of this patient.      LOS: 0 days   Sherri Sear, MD  05/29/2022, 4:32 PM    Note: This dictation was prepared with Dragon dictation along with smaller phrase technology. Any transcriptional errors that result from this process are unintentional.

## 2022-05-29 NOTE — Progress Notes (Signed)
C/O headache 7/10. Tylenol given . VS remain stable bp 107/62, HR 91 NSR, sats 100% RA. Call light in reach and continuing to monitor.

## 2022-05-29 NOTE — Progress Notes (Signed)
Blood consent form read to patient.  Patient states that a doctor explained why he may need it and explained the risks and benefits.  After reading the consent form to the patient and making sure he was A&Ox4 I asked him if he would like to speak the doctor about getting the blood or did he have any concerns.  The Patient stated no.  I witness the pt. Sign the consent form per the order and placed it on the patient's chart.

## 2022-05-29 NOTE — Progress Notes (Signed)
HR 130-140s and sustaining on monitor. BP 160/100. C/O HA and Chest pain 8/10, and nausea. Skin clammy to touch. Restless and anxious. CIWA score 13- '2mg'$  IV ativan given per order. SEE EMAR. Notified K.Foust, NP and EKG obtained. This nurse remaining at bedside and VS obtained.  0140- K.Foust, NP at bedside.  5 mg IV Metoprolol given per new order. HR and BP responded and decreased. BP 152/100, HR 110 ST.  0143-Still c/o chest pain 7/10 and tightness in left chest. SL Ntg given per new order with no relief . 2 mg IV morphine then given per one time order with relief. Placed on bedside monitor to continue to monitor closely and check VS more frequently. Call light in reach. Lying in bed and VS stable now. Continuing to monitor.

## 2022-05-29 NOTE — Inpatient Diabetes Management (Signed)
Inpatient Diabetes Program Recommendations  AACE/ADA: New Consensus Statement on Inpatient Glycemic Control (2015)  Target Ranges:  Prepandial:   less than 140 mg/dL      Peak postprandial:   less than 180 mg/dL (1-2 hours)      Critically ill patients:  140 - 180 mg/dL   Lab Results  Component Value Date   GLUCAP 257 (H) 05/29/2022   HGBA1C 5.4 06/05/2020    Review of Glycemic Control  Latest Reference Range & Units 05/29/22 01:14  Glucose-Capillary 70 - 99 mg/dL 257 (H)   Diabetes history: None Current orders for Inpatient glycemic control:  None  MD Note: Pt received Decadron 10 mg yesterday. Glucose trends elevated today.  Inpatient Diabetes Program Recommendations:    -  Consider CBG's and Novolog 0-9 units tid + hs scale  Thanks,  Tama Headings RN, MSN, BC-ADM Inpatient Diabetes Coordinator Team Pager [phone] (8a-5p)

## 2022-05-29 NOTE — Progress Notes (Signed)
PROGRESS NOTE    KELLAR WESTBERG  WUJ:[id number] DOB: [birth year] DOA: 05/28/2022 PCP: [facility]    Brief Narrative:  51 y.o. male with medical history significant for CAD with history of STEMI complicated by V. fib arrest during cardiac cath, chronic diastolic heart failure (last known LVEF 55 to 60% from an echo which was done 01/22), history of migraine headache alcohol abuse and polysubstance abuse who presents to the ER for evaluation of a headache. He has a history of migraine headaches but states that this headache feels different.  He describes a throbbing headache involving the occipital as well as frontal lobes rated a 10 x 10 in intensity at its worst associated with nausea, vomiting and photosensitivity.  He normally takes [facility] powders for his headaches but has not been able to take any analgesics due to the nausea and vomiting.  According to the patient he took 2 Aleve tablets on the day of admission and threw them back up. He also complains of pain in the epigastrium as well as midsternal chest pain which started while he was in the ER.  Chest pain is rated a 4 x 10 in intensity at its worst and it is nonradiating.  Denies having any associated nausea, no vomiting, no diaphoresis or palpitations. Epigastric pain according to the patient radiates to his back and is rated a 5 x 10 in intensity at its worst. He admits to daily alcohol use as well as occasional NSAID use. He also admits to passing bright red blood per rectum for several weeks and thinks he may have hemorrhoids.  He denies having any constipation or diarrhea.  He complains of feeling dizzy and lightheaded. He denies having any hematemesis or passage of melena stools. He denies having any fever, no chills, no neck stiffness or pain, no urinary symptoms, no cough, no leg swelling, no blurred vision no focal deficit. Labs reveal a drop in his hemoglobin from 15.2 g/dl (5 months ago) to 8.8 g/dl.  He also has  hypokalemia with potassium of 3.2 Initial troponin is negative and twelve-lead EKG shows normal sinus rhythm with ST and T wave changes in the lateral leads. Chart review shows patient had an EGD in March, 2022 which showed reactive gastropathy and Barrett's esophagus  No clear evidence of ACS, NSTEMI, unstable angina.  High-sensitivity troponin negative.  Patient does endorse shortness of breath and chest pressure.  Hemoglobin dropped from 8.8 on admission to 7.5 on 6/14.  Will transfuse 1 unit blood.  GI consult requested and pending.  Assessment & Plan:   Principal Problem:   Chest pain Active Problems:   CAD (coronary artery disease)   ABLA (acute blood loss anemia)   GI bleed   Esophageal reflux   Hypokalemia   TOBACCO ABUSE   Headache   Acute on chronic blood loss anemia  * Chest pain Patient with a history of coronary artery disease who presents to the ER for evaluation of chest pain Chest pain is likely secondary to anemia.  No clear evidence of ACS at this time Troponins flat with no significant delta Plan: Aspirin on hold considering acute on chronic anemia Continue statin Telemetry monitoring   CAD (coronary artery disease) Treatment as outlined in 1   GI bleed Patient noted to have a drop in his H&H from 15 g/dl (5 months ago) to 8.8 g/dl He states that he has bright red blood per rectum over several weeks Also concern for possible NSAID induced gastritis Hemoglobin  dropped to 7.5 on 6/14 Plan: Transfuse 1 unit PRBC Goal hemoglobin 8 We will likely need IV iron, iron studies ordered IV PPI GI consult May benefit from endoscopic evaluation however patient reluctant   ABLA (acute blood loss anemia) Treatment as outlined in 2   Esophageal reflux Continue IV PPI   Hypokalemia Most likely secondary to GI losses from nausea and vomiting Supplement potassium Daily labs   TOBACCO ABUSE Smoking cessation discussed with patient in detail We will place  patient on a nicotine transdermal patch 14 mg daily   Headache Patient has a history of migraine headaches and presents for evaluation of a 4-day history of headaches which he states are different from his usual migraines and rated 10 x 10 in intensity at its worst. Received IV Toradol, Decadron and Reglan in the ER without any significant improvement CT head negative Headache improved Plan: Place patient on Fioricet as needed  Polysubstance abuse Patient cocaine positive with a history of alcohol use No clear evidence of withdrawal on my evaluation Plan: CIWA protocol with as needed Ativan Counseled patient   DVT prophylaxis: SCDs Code Status: Full Family Communication: None today Disposition Plan: Status is: Inpatient Remains inpatient appropriate because: Chest pain with history of significant CAD.  Suspected GI bleed, acute on chronic anemia   Level of care: Progressive  Consultants:  GI  Procedures:  None  Antimicrobials: None   Subjective: Seen and examined.  Poor historian.  Endorses persistent chest pressure  Objective: Vitals:   05/29/22 1154 05/29/22 1200 05/29/22 1302 05/29/22 1347  BP: 92/61 (!) 99/58 105/63 107/61  Pulse: 95 91 96 (!) 102  Resp: '20 16 20 18  '$ Temp: 98.2 F (36.8 C)   98.3 F (36.8 C)  TempSrc:    Oral  SpO2: 98% 98% 100% 99%  Weight:      Height:        Intake/Output Summary (Last 24 hours) at 05/29/2022 1401 Last data filed at 05/29/2022 1342 Gross per 24 hour  Intake 2744.09 ml  Output 2350 ml  Net 394.09 ml   Filed Weights   05/28/22 1349  Weight: 90.7 kg    Examination:  General exam: NAD.  Appears chronically ill Respiratory system: Lungs clear.  Normal work of breathing.  Room air Cardiovascular system: S1-S2, RRR, no murmurs, no pedal edema Gastrointestinal system: Soft, NT/ND, normal bowel sounds Central nervous system: Alert and oriented. No focal neurological deficits. Extremities: Symmetric 5 x 5  power. Skin: No rashes, lesions or ulcers Psychiatry: Judgement and insight appear impaired. Mood & affect flattened.     Data Reviewed: I have personally reviewed following labs and imaging studies  CBC: Recent Labs  Lab 05/28/22 1415 05/29/22 0212 05/29/22 0813  WBC 6.0 4.6  --   NEUTROABS 4.1  --   --   HGB 8.8* 7.5* 7.2*  HCT 27.7* 24.4* 22.9*  MCV 95.2 95.7  --   PLT 186 143*  --    Basic Metabolic Panel: Recent Labs  Lab 05/28/22 1415 05/28/22 1819 05/29/22 0212  NA 138  --  133*  K 3.2*  --  3.5  CL 106  --  108  CO2 23  --  19*  GLUCOSE 113*  --  225*  BUN 6  --  7  CREATININE 0.53*  --  0.50*  CALCIUM 8.7*  --  8.1*  MG  --  1.7  --    GFR: Estimated Creatinine Clearance: 115.6 mL/min (A) (by C-G formula  based on SCr of 0.5 mg/dL (L)). Liver Function Tests: Recent Labs  Lab 05/28/22 1415  AST 138*  ALT 47*  ALKPHOS 287*  BILITOT 2.0*  PROT 7.8  ALBUMIN 3.1*   Recent Labs  Lab 05/28/22 1819  LIPASE 34   No results for input(s): "AMMONIA" in the last 168 hours. Coagulation Profile: No results for input(s): "INR", "PROTIME" in the last 168 hours. Cardiac Enzymes: No results for input(s): "CKTOTAL", "CKMB", "CKMBINDEX", "TROPONINI" in the last 168 hours. BNP (last 3 results) No results for input(s): "PROBNP" in the last 8760 hours. HbA1C: No results for input(s): "HGBA1C" in the last 72 hours. CBG: Recent Labs  Lab 05/29/22 0114  GLUCAP 257*   Lipid Profile: No results for input(s): "CHOL", "HDL", "LDLCALC", "TRIG", "CHOLHDL", "LDLDIRECT" in the last 72 hours. Thyroid Function Tests: No results for input(s): "TSH", "T4TOTAL", "FREET4", "T3FREE", "THYROIDAB" in the last 72 hours. Anemia Panel: Recent Labs    05/29/22 0813  FERRITIN 29  TIBC 435  IRON 57   Sepsis Labs: No results for input(s): "PROCALCITON", "LATICACIDVEN" in the last 168 hours.  No results found for this or any previous visit (from the past 240 hour(s)).        Radiology Studies: CT HEAD WO CONTRAST (5MM)  Result Date: 05/28/2022 CLINICAL DATA:  Positional headache. New or worsening. Migraine. Started a week ago. Got worse today. Ten 10 pain. Patient reports history of migraines. EXAM: CT HEAD WITHOUT CONTRAST TECHNIQUE: Contiguous axial images were obtained from the base of the skull through the vertex without intravenous contrast. RADIATION DOSE REDUCTION: This exam was performed according to the departmental dose-optimization program which includes automated exposure control, adjustment of the mA and/or kV according to patient size and/or use of iterative reconstruction technique. COMPARISON:  CT brain 03/07/2011 FINDINGS: Brain: There is mild cortical atrophy, within normal limits for patient age. The ventricles are normal in configuration. The basilar cisterns are patent. No mass, mass effect, or midline shift. No acute intracranial hemorrhage is seen. No abnormal extra-axial fluid collection. Preservation of the normal cortical gray-white interface without CT evidence of an acute major vascular territorial cortical based infarction. Vascular: No hyperdense vessel or unexpected calcification. Skull: Normal. Negative for fracture or focal lesion. Sinuses/Orbits: The visualized orbits are unremarkable. The visualized paranasal sinuses and mastoid air cells are clear. Other: None. IMPRESSION: No acute intracranial process. Electronically Signed   By: Yvonne Kendall M.D.   On: 05/28/2022 18:14   US Abdomen Limited RUQ (LIVER/GB)  Result Date: 05/28/2022 CLINICAL DATA:  [id number].  Abdominal pain. EXAM: ULTRASOUND ABDOMEN LIMITED RIGHT UPPER QUADRANT COMPARISON:  None Available. FINDINGS: Gallbladder: No gallstones or wall thickening visualized. No sonographic Murphy sign noted by sonographer. Common bile duct: Diameter: 4 mm in proximal diameter Liver: Hepatic parenchymal echogenicity is diffusely increased and there is poor acoustic through transmission in  keeping with severe hepatic steatosis. Parenchymal changes appear progressive since prior examination. This limits the evaluation for detection of focal intrahepatic masses, though no definite focal intrahepatic masses are seen and there is no intrahepatic biliary ductal dilation. portal vein is patent on color Doppler imaging with normal direction of blood flow towards the liver. Other: None. IMPRESSION: Marked hepatic steatosis, progressive since prior examination. Electronically Signed   By: Fidela Salisbury M.D.   On: 05/28/2022 17:19   DG Chest 1 View  Result Date: 05/28/2022 CLINICAL DATA:  Provided history: Chest pain. EXAM: CHEST  1 VIEW COMPARISON:  Prior chest radiographs 12/21/2021 and earlier. Chest  CT 08/07/2021. FINDINGS: Heart size at the upper limits of normal. No appreciable airspace consolidation or pulmonary edema. No evidence of pleural effusion or pneumothorax. No acute bony abnormality identified. IMPRESSION: No evidence of acute cardiopulmonary abnormality. Electronically Signed   By: Kellie Simmering D.O.   On: 05/28/2022 14:45        Scheduled Meds:  atorvastatin  40 mg Oral Daily   folic acid  1 mg Oral Daily   mouth rinse  15 mL Mouth Rinse BID   metoprolol tartrate  5 mg Intravenous Once   multivitamin with minerals  1 tablet Oral Daily   nicotine  14 mg Transdermal Daily   pantoprazole (PROTONIX) IV  40 mg Intravenous Q24H   thiamine  100 mg Oral Daily   Or   thiamine  100 mg Intravenous Daily   Continuous Infusions:   LOS: 0 days     Sidney Ace, MD [facility]   If 7PM-7AM, please contact night-coverage  05/29/2022, 2:01 PM

## 2022-05-30 ENCOUNTER — Inpatient Hospital Stay: Payer: Medicare HMO | Admitting: Anesthesiology

## 2022-05-30 ENCOUNTER — Encounter
Admission: EM | Disposition: A | Discharge status: Home / Self Care | Payer: Self-pay | Source: Home / Self Care | Attending: Internal Medicine

## 2022-05-30 ENCOUNTER — Encounter: Payer: Self-pay | Admitting: Internal Medicine

## 2022-05-30 ENCOUNTER — Other Ambulatory Visit: Payer: Self-pay

## 2022-05-30 DIAGNOSIS — D62 Acute posthemorrhagic anemia: Secondary | ICD-10-CM | POA: Diagnosis not present

## 2022-05-30 DIAGNOSIS — R079 Chest pain, unspecified: Secondary | ICD-10-CM | POA: Diagnosis not present

## 2022-05-30 DIAGNOSIS — K2100 Gastro-esophageal reflux disease with esophagitis, without bleeding: Secondary | ICD-10-CM

## 2022-05-30 DIAGNOSIS — D649 Anemia, unspecified: Secondary | ICD-10-CM

## 2022-05-30 HISTORY — PX: ESOPHAGOGASTRODUODENOSCOPY: SHX5428

## 2022-05-30 LAB — BPAM RBC
Blood Product Expiration Date: 202307112359
ISSUE DATE / TIME: 202306141026
Unit Type and Rh: 6200

## 2022-05-30 LAB — TYPE AND SCREEN
ABO/RH(D): A POS
Antibody Screen: NEGATIVE
Unit division: 0

## 2022-05-30 LAB — GLUCOSE, CAPILLARY: Glucose-Capillary: 110 mg/dL — ABNORMAL HIGH (ref 70–99)

## 2022-05-30 SURGERY — EGD (ESOPHAGOGASTRODUODENOSCOPY)
Anesthesia: General

## 2022-05-30 MED ORDER — DEXMEDETOMIDINE (PRECEDEX) IN NS 20 MCG/5ML (4 MCG/ML) IV SYRINGE
PREFILLED_SYRINGE | INTRAVENOUS | Status: DC | PRN
  Administered 2022-05-30: 8 ug via INTRAVENOUS

## 2022-05-30 MED ORDER — SODIUM CHLORIDE 0.9 % IV SOLN: INTRAVENOUS | Status: DC | PRN

## 2022-05-30 MED ORDER — PROPOFOL 500 MG/50ML IV EMUL
INTRAVENOUS | Status: DC | PRN
  Administered 2022-05-30: 140 ug/kg/min via INTRAVENOUS

## 2022-05-30 MED ORDER — SODIUM CHLORIDE 0.9 % IV SOLN
300.0000 mg | Freq: Once | INTRAVENOUS | Status: AC
  Administered 2022-05-30: 300 mg via INTRAVENOUS
  Filled 2022-05-30: qty 300

## 2022-05-30 MED ORDER — PROPOFOL 500 MG/50ML IV EMUL
INTRAVENOUS | Status: AC
  Filled 2022-05-30: qty 50

## 2022-05-30 MED ORDER — GABAPENTIN 300 MG PO CAPS
900.0000 mg | ORAL_CAPSULE | Freq: Every day | ORAL | Status: DC
  Administered 2022-05-30 – 2022-05-31 (×2): 900 mg via ORAL
  Filled 2022-05-30 (×2): qty 3

## 2022-05-30 MED ORDER — LIDOCAINE HCL (CARDIAC) PF 100 MG/5ML IV SOSY
PREFILLED_SYRINGE | INTRAVENOUS | Status: DC | PRN
  Administered 2022-05-30: 60 mg via INTRAVENOUS

## 2022-05-30 MED ORDER — SODIUM CHLORIDE 0.9 % IV SOLN: INTRAVENOUS | Status: DC

## 2022-05-30 MED ORDER — PROPOFOL 10 MG/ML IV BOLUS
INTRAVENOUS | Status: DC | PRN
  Administered 2022-05-30: 30 mg via INTRAVENOUS
  Administered 2022-05-30: 70 mg via INTRAVENOUS

## 2022-05-30 NOTE — Transfer of Care (Signed)
Immediate Anesthesia Transfer of Care Note  [name]  Procedure(s) Performed: ESOPHAGOGASTRODUODENOSCOPY (EGD)  Patient Location: Endoscopy Unit  Anesthesia Type:General  Level of Consciousness: drowsy  Airway & Oxygen Therapy: Patient Spontanous Breathing and Patient connected to nasal cannula oxygen  Post-op Assessment: Report given to RN and Post -op Vital signs reviewed and stable  Post vital signs: Reviewed and stable  Last Vitals:  Vitals Value Taken Time  BP 90/54 05/30/22 1611  Temp 36.1 C 05/30/22 1609  Pulse 95 05/30/22 1611  Resp 19 05/30/22 1611  SpO2 93 % 05/30/22 1611    Last Pain:  Vitals:   05/30/22 1609  TempSrc: Temporal  PainSc:       Patients Stated Pain Goal: 0 (92/01/00 7121)  Complications: No notable events documented.

## 2022-05-30 NOTE — Progress Notes (Signed)
PROGRESS NOTE    Ethan Wells  MWN:[id number] DOB: [birth year] DOA: 05/28/2022 PCP: [facility]    Brief Narrative:  51 y.o. male with medical history significant for CAD with history of STEMI complicated by V. fib arrest during cardiac cath, chronic diastolic heart failure (last known LVEF 55 to 60% from an echo which was done 01/22), history of migraine headache alcohol abuse and polysubstance abuse who presents to the ER for evaluation of a headache. He has a history of migraine headaches but states that this headache feels different.  He describes a throbbing headache involving the occipital as well as frontal lobes rated a 10 x 10 in intensity at its worst associated with nausea, vomiting and photosensitivity.  He normally takes [facility] powders for his headaches but has not been able to take any analgesics due to the nausea and vomiting.  According to the patient he took 2 Aleve tablets on the day of admission and threw them back up. He also complains of pain in the epigastrium as well as midsternal chest pain which started while he was in the ER.  Chest pain is rated a 4 x 10 in intensity at its worst and it is nonradiating.  Denies having any associated nausea, no vomiting, no diaphoresis or palpitations. Epigastric pain according to the patient radiates to his back and is rated a 5 x 10 in intensity at its worst. He admits to daily alcohol use as well as occasional NSAID use. He also admits to passing bright red blood per rectum for several weeks and thinks he may have hemorrhoids.  He denies having any constipation or diarrhea.  He complains of feeling dizzy and lightheaded. He denies having any hematemesis or passage of melena stools. He denies having any fever, no chills, no neck stiffness or pain, no urinary symptoms, no cough, no leg swelling, no blurred vision no focal deficit. Labs reveal a drop in his hemoglobin from 15.2 g/dl (5 months ago) to 8.8 g/dl.  He also has  hypokalemia with potassium of 3.2 Initial troponin is negative and twelve-lead EKG shows normal sinus rhythm with ST and T wave changes in the lateral leads. Chart review shows patient had an EGD in March, 2022 which showed reactive gastropathy and Barrett's esophagus  No clear evidence of ACS, NSTEMI, unstable angina.  High-sensitivity troponin negative.  Patient does endorse shortness of breath and chest pressure.  Hemoglobin dropped from 8.8 on admission to 7.5 on 6/14.  Will transfuse 1 unit blood.  GI consult requested   Plan for EGD 6/15.  Hemoglobin stable  Assessment & Plan:   Principal Problem:   Chest pain Active Problems:   CAD (coronary artery disease)   ABLA (acute blood loss anemia)   GI bleed   Esophageal reflux   Hypokalemia   TOBACCO ABUSE   Headache   Acute on chronic blood loss anemia  Chest pain Patient with a history of coronary artery disease who presents to the ER for evaluation of chest pain Chest pain is likely secondary to anemia.  No clear evidence of ACS at this time Troponins flat with no significant delta Plan: Aspirin on hold considering acute on chronic anemia Continue statin Telemetry monitoring   CAD (coronary artery disease) Treatment as outlined in 1   GI bleed Patient noted to have a drop in his H&H from 15 g/dl (5 months ago) to 8.8 g/dl He states that he has bright red blood per rectum over several weeks Also concern  for possible NSAID induced gastritis Hemoglobin dropped to 7.5 on 6/14 1 unit PRBC transfused 6/14 Plan:  Hemoglobin at goal.  No further transfusion at this time IV iron IV PPI EGD today   ABLA (acute blood loss anemia) Treatment as outlined in 2   Esophageal reflux Continue IV PPI   Hypokalemia Most likely secondary to GI losses from nausea and vomiting Supplement potassium Daily labs   TOBACCO ABUSE Smoking cessation discussed with patient in detail Offer nicotine patch   Headache Patient has a  history of migraine headaches and presents for evaluation of a 4-day history of headaches which he states are different from his usual migraines and rated 10 x 10 in intensity at its worst. Received IV Toradol, Decadron and Reglan in the ER without any significant improvement CT head negative Headache improved Plan: Place patient on Fioricet as needed  Polysubstance abuse Patient cocaine positive with a history of alcohol use No clear evidence of withdrawal on my evaluation Plan: CIWA protocol with as needed Ativan Counseled patient   DVT prophylaxis: SCDs Code Status: Full Family Communication: None today Disposition Plan: Status is: Inpatient Remains inpatient appropriate because: Chest pain with history of significant CAD.  Suspected GI bleed, acute on chronic anemia.  EGD today.  Possible discharge 6/16   Level of care: Progressive  Consultants:  GI  Procedures:  None  Antimicrobials: None   Subjective: Seen and examined.  Poor historian.  Endorses persistent chest pressure.  N.p.o. for EGD  Objective: Vitals:   05/30/22 1100 05/30/22 1122 05/30/22 1303 05/30/22 1304  BP:  112/62 (!) 106/59   Pulse: 87 98 93 93  Resp: (!) 21 (!) '25 18 19  '$ Temp:  97.7 F (36.5 C)    TempSrc:  Oral    SpO2: 99% 97% 97% 97%  Weight:      Height:        Intake/Output Summary (Last 24 hours) at 05/30/2022 1338 Last data filed at 05/30/2022 1122 Gross per 24 hour  Intake 1002.33 ml  Output 3000 ml  Net -1997.67 ml   Filed Weights   05/28/22 1349  Weight: 90.7 kg    Examination:  General exam: No acute distress Respiratory system: Lungs clear.  Normal work of breathing.  Room air Cardiovascular system: S1-S2, RRR, no murmurs, no pedal edema Gastrointestinal system: Soft, NT/ND, normal bowel sounds Central nervous system: Alert and oriented. No focal neurological deficits. Extremities: Symmetric 5 x 5 power. Skin: No rashes, lesions or ulcers Psychiatry: Judgement and  insight appear impaired. Mood & affect flattened.     Data Reviewed: I have personally reviewed following labs and imaging studies  CBC: Recent Labs  Lab 05/28/22 1415 05/29/22 0212 05/29/22 0813 05/29/22 1514  WBC 6.0 4.6  --   --   NEUTROABS 4.1  --   --   --   HGB 8.8* 7.5* 7.2* 8.1*  HCT 27.7* 24.4* 22.9* 25.8*  MCV 95.2 95.7  --   --   PLT 186 143*  --   --    Basic Metabolic Panel: Recent Labs  Lab 05/28/22 1415 05/28/22 1819 05/29/22 0212  NA 138  --  133*  K 3.2*  --  3.5  CL 106  --  108  CO2 23  --  19*  GLUCOSE 113*  --  225*  BUN 6  --  7  CREATININE 0.53*  --  0.50*  CALCIUM 8.7*  --  8.1*  MG  --  1.7  --  GFR: Estimated Creatinine Clearance: 115.6 mL/min (A) (by C-G formula based on SCr of 0.5 mg/dL (L)). Liver Function Tests: Recent Labs  Lab 05/28/22 1415  AST 138*  ALT 47*  ALKPHOS 287*  BILITOT 2.0*  PROT 7.8  ALBUMIN 3.1*   Recent Labs  Lab 05/28/22 1819  LIPASE 34   No results for input(s): "AMMONIA" in the last 168 hours. Coagulation Profile: No results for input(s): "INR", "PROTIME" in the last 168 hours. Cardiac Enzymes: No results for input(s): "CKTOTAL", "CKMB", "CKMBINDEX", "TROPONINI" in the last 168 hours. BNP (last 3 results) No results for input(s): "PROBNP" in the last 8760 hours. HbA1C: No results for input(s): "HGBA1C" in the last 72 hours. CBG: Recent Labs  Lab 05/29/22 0114 05/30/22 0911  GLUCAP 257* 110*   Lipid Profile: No results for input(s): "CHOL", "HDL", "LDLCALC", "TRIG", "CHOLHDL", "LDLDIRECT" in the last 72 hours. Thyroid Function Tests: No results for input(s): "TSH", "T4TOTAL", "FREET4", "T3FREE", "THYROIDAB" in the last 72 hours. Anemia Panel: Recent Labs    05/29/22 0813  FERRITIN 29  TIBC 435  IRON 57   Sepsis Labs: No results for input(s): "PROCALCITON", "LATICACIDVEN" in the last 168 hours.  No results found for this or any previous visit (from the past 240 hour(s)).        Radiology Studies: CT HEAD WO CONTRAST (5MM)  Result Date: 05/28/2022 CLINICAL DATA:  Positional headache. New or worsening. Migraine. Started a week ago. Got worse today. Ten 10 pain. Patient reports history of migraines. EXAM: CT HEAD WITHOUT CONTRAST TECHNIQUE: Contiguous axial images were obtained from the base of the skull through the vertex without intravenous contrast. RADIATION DOSE REDUCTION: This exam was performed according to the departmental dose-optimization program which includes automated exposure control, adjustment of the mA and/or kV according to patient size and/or use of iterative reconstruction technique. COMPARISON:  CT brain 03/07/2011 FINDINGS: Brain: There is mild cortical atrophy, within normal limits for patient age. The ventricles are normal in configuration. The basilar cisterns are patent. No mass, mass effect, or midline shift. No acute intracranial hemorrhage is seen. No abnormal extra-axial fluid collection. Preservation of the normal cortical gray-white interface without CT evidence of an acute major vascular territorial cortical based infarction. Vascular: No hyperdense vessel or unexpected calcification. Skull: Normal. Negative for fracture or focal lesion. Sinuses/Orbits: The visualized orbits are unremarkable. The visualized paranasal sinuses and mastoid air cells are clear. Other: None. IMPRESSION: No acute intracranial process. Electronically Signed   By: Yvonne Kendall M.D.   On: 05/28/2022 18:14   US Abdomen Limited RUQ (LIVER/GB)  Result Date: 05/28/2022 CLINICAL DATA:  [id number].  Abdominal pain. EXAM: ULTRASOUND ABDOMEN LIMITED RIGHT UPPER QUADRANT COMPARISON:  None Available. FINDINGS: Gallbladder: No gallstones or wall thickening visualized. No sonographic Murphy sign noted by sonographer. Common bile duct: Diameter: 4 mm in proximal diameter Liver: Hepatic parenchymal echogenicity is diffusely increased and there is poor acoustic through transmission in  keeping with severe hepatic steatosis. Parenchymal changes appear progressive since prior examination. This limits the evaluation for detection of focal intrahepatic masses, though no definite focal intrahepatic masses are seen and there is no intrahepatic biliary ductal dilation. portal vein is patent on color Doppler imaging with normal direction of blood flow towards the liver. Other: None. IMPRESSION: Marked hepatic steatosis, progressive since prior examination. Electronically Signed   By: Fidela Salisbury M.D.   On: 05/28/2022 17:19   DG Chest 1 View  Result Date: 05/28/2022 CLINICAL DATA:  Provided history: Chest pain. EXAM:  CHEST  1 VIEW COMPARISON:  Prior chest radiographs 12/21/2021 and earlier. Chest CT 08/07/2021. FINDINGS: Heart size at the upper limits of normal. No appreciable airspace consolidation or pulmonary edema. No evidence of pleural effusion or pneumothorax. No acute bony abnormality identified. IMPRESSION: No evidence of acute cardiopulmonary abnormality. Electronically Signed   By: Kellie Simmering D.O.   On: 05/28/2022 14:45        Scheduled Meds:  atorvastatin  40 mg Oral Daily   folic acid  1 mg Oral Daily   mouth rinse  15 mL Mouth Rinse BID   metoprolol tartrate  5 mg Intravenous Once   multivitamin with minerals  1 tablet Oral Daily   nicotine  14 mg Transdermal Daily   pantoprazole (PROTONIX) IV  40 mg Intravenous Q12H   thiamine  100 mg Oral Daily   Or   thiamine  100 mg Intravenous Daily   Continuous Infusions:   LOS: 1 day     Sidney Ace, MD [facility]   If 7PM-7AM, please contact night-coverage  05/30/2022, 1:38 PM

## 2022-05-30 NOTE — Op Note (Signed)
[facility] Gastroenterology Patient Name: Ethan Wells Procedure Date: 05/30/2022 3:48 PM MRN: [id number] Account #: [id number] Date of Birth: August 16, 1971 Admit Type: Inpatient Age: 51 Room: [facility] ENDO ROOM 1 Gender: Male Note Status: Finalized Instrument Name: Upper Endoscope [id number] Procedure:             Upper GI endoscopy Indications:           Epigastric abdominal pain, Hematochezia Providers:             Lin Landsman MD, MD Referring MD:          No Local Md, MD (Referring MD) Medicines:             General Anesthesia Complications:         No immediate complications. Estimated blood loss: None. Procedure:             Pre-Anesthesia Assessment:                        - Prior to the procedure, a History and Physical was                         performed, and patient medications and allergies were                         reviewed. The patient is competent. The risks and                         benefits of the procedure and the sedation options and                         risks were discussed with the patient. All questions                         were answered and informed consent was obtained.                         Patient identification and proposed procedure were                         verified by the physician, the nurse, the                         anesthesiologist, the anesthetist and the technician                         in the pre-procedure area in the procedure room in the                         endoscopy suite. Mental Status Examination: alert and                         oriented. Airway Examination: normal oropharyngeal                         airway and neck mobility. Respiratory Examination:                         clear to auscultation. CV Examination: normal.  Prophylactic Antibiotics: The patient does not require                         prophylactic antibiotics. Prior Anticoagulants: The                          patient has taken no previous anticoagulant or                         antiplatelet agents. ASA Grade Assessment: III - A                         patient with severe systemic disease. After reviewing                         the risks and benefits, the patient was deemed in                         satisfactory condition to undergo the procedure. The                         anesthesia plan was to use general anesthesia.                         Immediately prior to administration of medications,                         the patient was re-assessed for adequacy to receive                         sedatives. The heart rate, respiratory rate, oxygen                         saturations, blood pressure, adequacy of pulmonary                         ventilation, and response to care were monitored                         throughout the procedure. The physical status of the                         patient was re-assessed after the procedure.                        After obtaining informed consent, the endoscope was                         passed under direct vision. Throughout the procedure,                         the patient's blood pressure, pulse, and oxygen                         saturations were monitored continuously. The Endoscope                         was introduced through the mouth, and advanced to the  second part of duodenum. The upper GI endoscopy was                         accomplished without difficulty. The patient tolerated                         the procedure well. Findings:      Diffuse moderate inflammation characterized by congestion (edema) and       erythema was found in the duodenal bulb and in the second portion of the       duodenum.      Mild portal hypertensive gastropathy was found in the entire examined       stomach.      The cardia and gastric fundus were normal on retroflexion.      There were esophageal mucosal changes suspicious for  long-segment       Barrett's esophagus present in the lower third of the esophagus from       34cm to 39cm. The maximum longitudinal extent of these mucosal changes       was 4 cm in length. Mucosa was biopsied with a cold forceps for       histology. One specimen bottle was sent to pathology. Estimated blood       loss: none.      LA Grade D (one or more mucosal breaks involving at least 75% of       esophageal circumference) esophagitis with no bleeding and clean based       ulcers was found in the lower third of the esophagus. Biopsies were       taken with a cold forceps for histology.      The upper third of the esophagus and middle third of the esophagus were       normal. Impression:            - Alcoholic duodenitis.                        - Portal hypertensive gastropathy.                        - Esophageal mucosal changes suspicious for                         long-segment Barrett's esophagus. Biopsied.                        - LA Grade D acute and erosive esophagitis with no                         bleeding. Biopsied.                        - Normal upper third of esophagus and middle third of                         esophagus. Recommendation:        - Await pathology results.                        - Return patient to hospital ward for ongoing care.                        -  Cardiac diet today.                        - Continue present medications.                        - Return to my office at appointment to be scheduled .                        - Repeat upper endoscopy in 3 months to check healing.                        - Use a proton pump inhibitor PO BID indefinitely. Procedure Code(s):     --- Professional ---                        [id number], Esophagogastroduodenoscopy, flexible,                         transoral; with biopsy, single or multiple Diagnosis Code(s):     --- Professional ---                        K29.80, Duodenitis without bleeding                         K76.6, Portal hypertension                        K31.89, Other diseases of stomach and duodenum                        K22.8, Other specified diseases of esophagus                        K20.80, Other esophagitis without bleeding                        R10.13, Epigastric pain                        K92.1, Melena (includes Hematochezia) CPT copyright 2019 American Medical Association. All rights reserved. The codes documented in this report are preliminary and upon coder review may  be revised to meet current compliance requirements. Dr. Ulyess Mort Lin Landsman MD, MD 05/30/2022 4:10:03 PM This report has been signed electronically. Number of Addenda: 0 Note Initiated On: 05/30/2022 3:48 PM Estimated Blood Loss:  Estimated blood loss: none.      [facility]

## 2022-05-30 NOTE — Anesthesia Preprocedure Evaluation (Deleted)
Anesthesia Evaluation    Airway        Dental   Pulmonary Current Smoker and Patient abstained from smoking.,           Cardiovascular hypertension, + CAD (s/p MI and stents; v fib arrest during cardiac cath) and +CHF (2/2 ICM)    ECG 05/29/22:  Sinus tachycardia ST & T wave abnormality, consider inferior ischemia  Myocardial perfusion 07/14/19: Pharmacological myocardial perfusion imaging study with no significant ischemia.  Unable to exclude small region mild fixed defect anterior wall consistent with prior MI.  Grossly normal wall motion, unable to exclude mild hypokinesis anteroseptal region, EF estimated at 50%.  No EKG changes concerning for ischemia at peak stress or in recovery.  Low risk scan.   Neuro/Psych  Headaches, PSYCHIATRIC DISORDERS Anxiety Depression Polysubstance use disorder: alcohol and cocaine; tox screen 05/29/22 negative for cocaine but positive for barbs and benzos    GI/Hepatic GERD (Barrett esophagus)  ,  Endo/Other    Renal/GU      Musculoskeletal   Abdominal   Peds  Hematology  (+) Blood dyscrasia, anemia ,   Anesthesia Other Findings   Reproductive/Obstetrics                             Anesthesia Physical Anesthesia Plan  ASA: 3  Anesthesia Plan:    Post-op Pain Management:    Induction:   PONV Risk Score and Plan:   Airway Management Planned:   Additional Equipment:   Intra-op Plan:   Post-operative Plan:   Informed Consent:   Plan Discussed with:   Anesthesia Plan Comments:         Anesthesia Quick Evaluation

## 2022-05-30 NOTE — Anesthesia Preprocedure Evaluation (Signed)
Anesthesia Evaluation  Patient identified by MRN, date of birth, ID band Patient awake    Reviewed: Allergy & Precautions, NPO status , Patient's Chart, lab work & pertinent test results  History of Anesthesia Complications Negative for: history of anesthetic complications  Airway Mallampati: III  TM Distance: >3 FB Neck ROM: Full    Dental  (+) Dental Advidsory Given, Missing, Poor Dentition, Chipped   Pulmonary shortness of breath, neg COPD, neg recent URI, Current Smoker and Patient abstained from smoking.,    Pulmonary exam normal        Cardiovascular Exercise Tolerance: Poor hypertension, (-) angina+ CAD, + Past MI, + Cardiac Stents (x1 in 2012) and +CHF  (-) CABG Normal cardiovascular exam+ dysrhythmias (cardiac arrest during STEMI) (-) Valvular Problems/Murmurs  Echo 12/2020: 1. Left ventricular ejection fraction, by estimation, is 55 to 60%. The left ventricle has normal function. The left ventricle has no regional wall motion abnormalities. Left ventricular diastolic parameters were normal.  2. Right ventricular systolic function is normal. The right ventricular size is normal.  3. The mitral valve is normal in structure. Trivial mitral valve regurgitation. No evidence of mitral stenosis.  4. The aortic valve is normal in structure. Aortic valve regurgitation is  not visualized. No aortic stenosis is present.  5. The inferior vena cava is normal in size with greater than 50% respiratory variability, suggesting right atrial pressure of 3 mmHg.   "He can proceed with his planned GI workup with endoscopy and colonoscopy without further cardiac workup." Lauree Chandler, cardiology 02/20/2021   Neuro/Psych  Headaches, neg Seizures PSYCHIATRIC DISORDERS Anxiety Depression    GI/Hepatic GERD  Controlled,(+)     substance abuse  alcohol use and cocaine use, Epigastric pain    Endo/Other  negative endocrine ROSBMI  33  Renal/GU negative Renal ROS     Musculoskeletal negative musculoskeletal ROS (+)   Abdominal (+) + obese,   Peds  Hematology  (+) Blood dyscrasia, anemia ,   Anesthesia Other Findings Past Medical History: No date: CAD (coronary artery disease)     Comment:  a. 01/2011 Anterior STEMI/Cath/PCI: LM nl, LAD 100d               (3.5x50m Vision BMS placed), LCX 256mRI 50, RCA min               irregs, EF 40% w/ apical, inferoapical HK. No date: Cardiac arrest - ventricular fibrillation     Comment:  a. In setting of STEMI 01/2011 No date: Chronic pain No date: [place]: Depression with anxiety No date: ETOH abuse     Comment:  a. 6-12 beers/day No date: GERD (gastroesophageal reflux disease) No date: Hemorrhoids No date: Hyperlipidemia No date: Hypertension No date: Ischemic cardiomyopathy     Comment:  a. 06/2011 Echo: EF 45-50%, No rwma No date: Marijuana abuse No date: Migraines No date: Tobacco abuse     Comment:  a. 1/2 ppd x 26 yrs   Reproductive/Obstetrics negative OB ROS                             Anesthesia Physical  Anesthesia Plan  ASA: 3  Anesthesia Plan: General   Post-op Pain Management:    Induction: Intravenous  PONV Risk Score and Plan: 1 and Propofol infusion, TIVA and Treatment may vary due to age or medical condition  Airway Management Planned: Natural Airway and Nasal Cannula  Additional  Equipment: None  Intra-op Plan:   Post-operative Plan:   Informed Consent: I have reviewed the patients History and Physical, chart, labs and discussed the procedure including the risks, benefits and alternatives for the proposed anesthesia with the patient or authorized representative who has indicated his/her understanding and acceptance.     Dental advisory given  Plan Discussed with: CRNA  Anesthesia Plan Comments:         Anesthesia Quick Evaluation

## 2022-05-31 DIAGNOSIS — R079 Chest pain, unspecified: Secondary | ICD-10-CM | POA: Diagnosis not present

## 2022-05-31 LAB — CBC
HCT: 29.4 % — ABNORMAL LOW (ref 39.0–52.0)
Hemoglobin: 9.2 g/dL — ABNORMAL LOW (ref 13.0–17.0)
MCH: 30.4 pg (ref 26.0–34.0)
MCHC: 31.3 g/dL (ref 30.0–36.0)
MCV: 97 fL (ref 80.0–100.0)
Platelets: 137 10*3/uL — ABNORMAL LOW (ref 150–400)
RBC: 3.03 MIL/uL — ABNORMAL LOW (ref 4.22–5.81)
RDW: 18.1 % — ABNORMAL HIGH (ref 11.5–15.5)
WBC: 6.2 10*3/uL (ref 4.0–10.5)
nRBC: 0 % (ref 0.0–0.2)

## 2022-05-31 MED ORDER — ISOSORBIDE MONONITRATE ER 30 MG PO TB24
30.0000 mg | ORAL_TABLET | Freq: Every day | ORAL | Status: DC
  Administered 2022-05-31: 30 mg via ORAL
  Filled 2022-05-31: qty 1

## 2022-05-31 MED ORDER — PANTOPRAZOLE SODIUM 40 MG PO TBEC
40.0000 mg | DELAYED_RELEASE_TABLET | Freq: Two times a day (BID) | ORAL | Status: DC
  Administered 2022-05-31 – 2022-06-01 (×2): 40 mg via ORAL
  Filled 2022-05-31 (×2): qty 1

## 2022-05-31 MED ORDER — METHOCARBAMOL 500 MG PO TABS
750.0000 mg | ORAL_TABLET | Freq: Three times a day (TID) | ORAL | Status: DC
  Administered 2022-05-31 – 2022-06-01 (×3): 750 mg via ORAL
  Filled 2022-05-31 (×3): qty 2

## 2022-05-31 NOTE — Progress Notes (Signed)
PROGRESS NOTE    Ethan Wells  KWI:[id number] DOB: [birth year] DOA: 05/28/2022 PCP: [facility]    Brief Narrative:  51 y.o. male with medical history significant for CAD with history of STEMI complicated by V. fib arrest during cardiac cath, chronic diastolic heart failure (last known LVEF 55 to 60% from an echo which was done 01/22), history of migraine headache alcohol abuse and polysubstance abuse who presents to the ER for evaluation of a headache. He has a history of migraine headaches but states that this headache feels different.  He describes a throbbing headache involving the occipital as well as frontal lobes rated a 10 x 10 in intensity at its worst associated with nausea, vomiting and photosensitivity.  He normally takes [facility] powders for his headaches but has not been able to take any analgesics due to the nausea and vomiting.  According to the patient he took 2 Aleve tablets on the day of admission and threw them back up. He also complains of pain in the epigastrium as well as midsternal chest pain which started while he was in the ER.  Chest pain is rated a 4 x 10 in intensity at its worst and it is nonradiating.  Denies having any associated nausea, no vomiting, no diaphoresis or palpitations. Epigastric pain according to the patient radiates to his back and is rated a 5 x 10 in intensity at its worst. He admits to daily alcohol use as well as occasional NSAID use. He also admits to passing bright red blood per rectum for several weeks and thinks he may have hemorrhoids.  He denies having any constipation or diarrhea.  He complains of feeling dizzy and lightheaded. He denies having any hematemesis or passage of melena stools. He denies having any fever, no chills, no neck stiffness or pain, no urinary symptoms, no cough, no leg swelling, no blurred vision no focal deficit. Labs reveal a drop in his hemoglobin from 15.2 g/dl (5 months ago) to 8.8 g/dl.  He also has  hypokalemia with potassium of 3.2 Initial troponin is negative and twelve-lead EKG shows normal sinus rhythm with ST and T wave changes in the lateral leads. Chart review shows patient had an EGD in March, 2022 which showed reactive gastropathy and Barrett's esophagus  No clear evidence of ACS, NSTEMI, unstable angina.  High-sensitivity troponin negative.  Patient does endorse shortness of breath and chest pressure.  Hemoglobin dropped from 8.8 on admission to 7.5 on 6/14.  Will transfuse 1 unit blood.  GI consult requested   Status post EGD 6/15.  Erosive esophagitis noted.  Likely source of chronic blood loss.  Hemoglobin on postprocedure day #1 is 9.2.  No indication for repeat transfusion.  Patient continues to endorse chest pain.  Present consistently.  Radiation to left arm.  Principal Problem:   Chest pain Active Problems:   CAD (coronary artery disease)   ABLA (acute blood loss anemia)   GI bleed   Esophageal reflux   Hypokalemia   TOBACCO ABUSE   Headache   Acute on chronic blood loss anemia  Chest pain Patient with a history of coronary artery disease who presents to the ER for evaluation of chest pain Chest pain is likely secondary to anemia.  No clear evidence of ACS at this time Troponins flat with no significant delta Also possible anginal component Plan: Aspirin on hold considering acute on chronic anemia, can likely restart next 24 hours Add Imdur 30 mg every 24 hours Continue statin Telemetry  monitoring   CAD (coronary artery disease) Treatment as outlined in 1   GI bleed Patient noted to have a drop in his H&H from 15 g/dl (5 months ago) to 8.8 g/dl He states that he has bright red blood per rectum over several weeks Also concern for possible NSAID induced gastritis Hemoglobin dropped to 7.5 on 6/14 1 unit PRBC transfused 6/14 EGD 6/15, erosive esophagitis noted Post procedure hemoglobin 9.2 Status post IV iron Plan:  Hemoglobin at goal.  No further  transfusion at this time P.o. PPI twice daily   ABLA (acute blood loss anemia) Treatment as outlined in 2   Esophageal reflux P.o. PPI twice daily indefinitely   Hypokalemia Most likely secondary to GI losses from nausea and vomiting Supplement potassium Daily labs   TOBACCO ABUSE Smoking cessation discussed with patient in detail Offer nicotine patch   Headache Patient has a history of migraine headaches and presents for evaluation of a 4-day history of headaches which he states are different from his usual migraines and rated 10 x 10 in intensity at its worst. Received IV Toradol, Decadron and Reglan in the ER without any significant improvement CT head negative Headache improved Plan: Place patient on Fioricet as needed  Polysubstance abuse Patient cocaine positive with a history of alcohol use No clear evidence of withdrawal on my evaluation Plan: CIWA protocol with as needed Ativan Counseled patient Not scoring on CIWA currently   DVT prophylaxis: SCDs Code Status: Full Family Communication: None today Disposition Plan: Status is: Inpatient Remains inpatient appropriate because: Chest pain with history of significant CAD.  Acute on chronic anemia.  GI bleed.  Chest pain persistent.  We will add antianginal therapy and hopeful discharge on 6/17   Level of care: Progressive  Consultants:  GI  Procedures:  None  Antimicrobials: None   Subjective: Seen and examined.  Endorses persistent chest pain  Objective: Vitals:   05/30/22 1935 05/30/22 2338 05/31/22 0436 05/31/22 0828  BP: (!) 95/52 (!) 110/54 99/63 (!) 121/91  Pulse: 85 93 80 86  Resp: 16 (!) '21 15 20  '$ Temp: 97.6 F (36.4 C) 97.6 F (36.4 C) 97.7 F (36.5 C) 97.7 F (36.5 C)  TempSrc: Oral Oral Oral Oral  SpO2: 100% 98% 95% 99%  Weight:      Height:        Intake/Output Summary (Last 24 hours) at 05/31/2022 1125 Last data filed at 05/31/2022 1000 Gross per 24 hour  Intake 1293.36 ml   Output 2625 ml  Net -1331.64 ml   Filed Weights   05/28/22 1349 05/30/22 1515  Weight: 90.7 kg 83.9 kg    Examination:  General exam: NAD Respiratory system: Lungs clear.  Normal work of breathing.  Room air Cardiovascular system: S1-S2, RRR, no murmurs, no pedal edema Gastrointestinal system: Soft, NT/ND, normal bowel sounds Central nervous system: Alert and oriented. No focal neurological deficits. Extremities: Symmetric 5 x 5 power. Skin: No rashes, lesions or ulcers Psychiatry: Judgement and insight appear impaired. Mood & affect flattened.     Data Reviewed: I have personally reviewed following labs and imaging studies  CBC: Recent Labs  Lab 05/28/22 1415 05/29/22 0212 05/29/22 0813 05/29/22 1514 05/31/22 0931  WBC 6.0 4.6  --   --  6.2  NEUTROABS 4.1  --   --   --   --   HGB 8.8* 7.5* 7.2* 8.1* 9.2*  HCT 27.7* 24.4* 22.9* 25.8* 29.4*  MCV 95.2 95.7  --   --  97.0  PLT 186 143*  --   --  657*   Basic Metabolic Panel: Recent Labs  Lab 05/28/22 1415 05/28/22 1819 05/29/22 0212  NA 138  --  133*  K 3.2*  --  3.5  CL 106  --  108  CO2 23  --  19*  GLUCOSE 113*  --  225*  BUN 6  --  7  CREATININE 0.53*  --  0.50*  CALCIUM 8.7*  --  8.1*  MG  --  1.7  --    GFR: Estimated Creatinine Clearance: 111.4 mL/min (A) (by C-G formula based on SCr of 0.5 mg/dL (L)). Liver Function Tests: Recent Labs  Lab 05/28/22 1415  AST 138*  ALT 47*  ALKPHOS 287*  BILITOT 2.0*  PROT 7.8  ALBUMIN 3.1*   Recent Labs  Lab 05/28/22 1819  LIPASE 34   No results for input(s): "AMMONIA" in the last 168 hours. Coagulation Profile: No results for input(s): "INR", "PROTIME" in the last 168 hours. Cardiac Enzymes: No results for input(s): "CKTOTAL", "CKMB", "CKMBINDEX", "TROPONINI" in the last 168 hours. BNP (last 3 results) No results for input(s): "PROBNP" in the last 8760 hours. HbA1C: No results for input(s): "HGBA1C" in the last 72 hours. CBG: Recent Labs  Lab  05/29/22 0114 05/30/22 0911  GLUCAP 257* 110*   Lipid Profile: No results for input(s): "CHOL", "HDL", "LDLCALC", "TRIG", "CHOLHDL", "LDLDIRECT" in the last 72 hours. Thyroid Function Tests: No results for input(s): "TSH", "T4TOTAL", "FREET4", "T3FREE", "THYROIDAB" in the last 72 hours. Anemia Panel: Recent Labs    05/29/22 0813  FERRITIN 29  TIBC 435  IRON 57   Sepsis Labs: No results for input(s): "PROCALCITON", "LATICACIDVEN" in the last 168 hours.  No results found for this or any previous visit (from the past 240 hour(s)).       Radiology Studies: No results found.      Scheduled Meds:  atorvastatin  40 mg Oral Daily   folic acid  1 mg Oral Daily   gabapentin  900 mg Oral QHS   isosorbide mononitrate  30 mg Oral Daily   mouth rinse  15 mL Mouth Rinse BID   metoprolol tartrate  5 mg Intravenous Once   multivitamin with minerals  1 tablet Oral Daily   nicotine  14 mg Transdermal Daily   pantoprazole  40 mg Oral BID   thiamine  100 mg Oral Daily   Or   thiamine  100 mg Intravenous Daily   Continuous Infusions:   LOS: 2 days     Sidney Ace, MD [facility]   If 7PM-7AM, please contact night-coverage  05/31/2022, 11:25 AM

## 2022-05-31 NOTE — Care Management Important Message (Signed)
Important Message  Patient Details  Name: KEIN CARLBERG MRN: [id number] Date of Birth: 12-Mar-1971   Medicare Important Message Given:  N/A - LOS <3 / Initial given by admissions     [name] 05/31/2022, 1:31 PM

## 2022-05-31 NOTE — Evaluation (Signed)
Occupational Therapy Evaluation Patient Details Name: Ethan Wells MRN: [id number] DOB: Nov 06, 1971 Today's Date: 05/31/2022   History of Present Illness 51 y.o. male with medical history significant for CAD with history of STEMI complicated by V. fib arrest during cardiac cath, chronic diastolic heart failure (last known LVEF 55 to 60% from an echo which was done 01/22), history of migraine headache alcohol abuse and polysubstance abuse who presents to the ER for evaluation of a headache.No clear evidence of ACS, NSTEMI, unstable angina.  High-sensitivity troponin negative.  Patient does endorse shortness of breath and chest pressure.  Hemoglobin dropped from 8.8 on admission to 7.5 on 6/14.   GI consult requested   Clinical Impression   Chart reviewed, RN cleared pt for participation in OT evaluation. PTA pt reports he lives in a motel, drives, is able to perform ADL with MOD I, history of falls in the last six month after "tripping over things". Cognition appears WFL. Pt performs bed mobility with MOD I, short amb transfer to [facility] with supervision, toileting with supervision, LB dressing with SET UP. Pt is performing ADL close to baseline, further mobility limited by pt reported dizziness. BP WNL during evaluation, was low earlier in the day. No OT follow up recommended. OT will follow acutely to facilitate return to PLOF.      Recommendations for follow up therapy are one component of a multi-disciplinary discharge planning process, led by the attending physician.  Recommendations may be updated based on patient status, additional functional criteria and insurance authorization.   Follow Up Recommendations  No OT follow up    Assistance Recommended at Discharge Intermittent Supervision/Assistance  Patient can return home with the following A little help with bathing/dressing/bathroom    Functional Status Assessment  Patient has had a recent decline in their functional status and  demonstrates the ability to make significant improvements in function in a reasonable and predictable amount of time.  Equipment Recommendations  None recommended by OT    Recommendations for Other Services       Precautions / Restrictions Precautions Precautions: Fall Precaution Comments: watch bp Restrictions Weight Bearing Restrictions: No      Mobility Bed Mobility Overal bed mobility: Modified Independent                  Transfers Overall transfer level: Needs assistance   Transfers: Sit to/from Stand Sit to Stand: Supervision                  Balance Overall balance assessment: Needs assistance Sitting-balance support: Feet supported Sitting balance-Leahy Scale: Normal       Standing balance-Leahy Scale: Good                             ADL either performed or assessed with clinical judgement   ADL Overall ADL's : Needs assistance/impaired     Grooming: Wash/dry hands;Sitting;Set up               Lower Body Dressing: Set up Lower Body Dressing Details (indicate cue type and reason): socks bed level Toilet Transfer: Min Dispensing optician Details (indicate cue type and reason): short amb transfer to [place] and Hygiene: Supervision/safety;Sit to/from stand Toileting - Clothing Manipulation Details (indicate cue type and reason): pt performs peri care on toilet at baseline per report     Functional mobility during ADLs: Supervision/safety (short distances in room)  Vision Patient Visual Report: No change from baseline       Perception     Praxis      Pertinent Vitals/Pain Pain Assessment Pain Assessment: 0-10 Pain Score: 6  Pain Location: generalized Pain Descriptors / Indicators: Aching, Grimacing, Guarding Pain Intervention(s): Limited activity within patient's tolerance, Monitored during session, Repositioned     Hand Dominance Right   Extremity/Trunk  Assessment Upper Extremity Assessment Upper Extremity Assessment: Overall WFL for tasks assessed   Lower Extremity Assessment Lower Extremity Assessment: Overall WFL for tasks assessed   Cervical / Trunk Assessment Cervical / Trunk Assessment: Normal   Communication Communication Communication: No difficulties   Cognition Arousal/Alertness: Awake/alert Behavior During Therapy: WFL for tasks assessed/performed Overall Cognitive Status: Within Functional Limits for tasks assessed                                       General Comments  BP 106/56 in supine, 112/76 edge of bed, 113/63 after mobility and BM ; HR up to 111 during mobility    Exercises     Shoulder Instructions      Home Living Family/patient expects to be discharged to:: Unsure                                 Additional Comments: pt was living in motel PTA      Prior Functioning/Environment Prior Level of Function : Independent/Modified Independent             Mobility Comments: pt reports at least one fall in the last six months, reports he does not amb with walker/cane ADLs Comments: pt reports MOD I with ADL/IADL        OT Problem List:        OT Treatment/Interventions: Therapeutic exercise;DME and/or AE instruction;Patient/family education;Self-care/ADL training;Therapeutic activities    OT Goals(Current goals can be found in the care plan section) Acute Rehab OT Goals Patient Stated Goal: get stronger OT Goal Formulation: With patient Time For Goal Achievement: 06/14/22 Potential to Achieve Goals: Good ADL Goals Pt Will Transfer to Toilet: with modified independence Pt Will Perform Toileting - Clothing Manipulation and hygiene: with modified independence;sit to/from stand  OT Frequency: Min 2X/week    Co-evaluation              AM-PAC OT "6 Clicks" Daily Activity     Outcome Measure Help from another person eating meals?: None Help from another person  taking care of personal grooming?: None Help from another person toileting, which includes using toliet, bedpan, or urinal?: None Help from another person bathing (including washing, rinsing, drying)?: None Help from another person to put on and taking off regular upper body clothing?: None Help from another person to put on and taking off regular lower body clothing?: None 6 Click Score: 24   End of Session Nurse Communication: Mobility status  Activity Tolerance: Patient tolerated treatment well Patient left: in bed;with call bell/phone within reach;with bed alarm set  OT Visit Diagnosis: History of falling (Z91.81)                Time: 0350-0938 OT Time Calculation (min): 17 min Charges:  OT General Charges $OT Visit: 1 Visit OT Evaluation $OT Eval Low Complexity: 1 Low  Shanon Payor, OTD OTR/L  05/31/22, 4:41 PM

## 2022-05-31 NOTE — Progress Notes (Signed)
Pt hypotensive - BP 80s/40s.  Pt received new order of Imdur this morning.  MD notified of BP.  Will continue to monitor.

## 2022-06-01 DIAGNOSIS — D62 Acute posthemorrhagic anemia: Secondary | ICD-10-CM | POA: Diagnosis not present

## 2022-06-01 DIAGNOSIS — R079 Chest pain, unspecified: Secondary | ICD-10-CM | POA: Diagnosis not present

## 2022-06-01 MED ORDER — ONDANSETRON 4 MG PO TBDP: 4.0000 mg | ORAL_TABLET | Freq: Three times a day (TID) | ORAL | 0 refills | Status: DC | PRN

## 2022-06-01 MED ORDER — PANTOPRAZOLE SODIUM 40 MG PO TBEC: 40.0000 mg | DELAYED_RELEASE_TABLET | Freq: Every day | ORAL | 0 refills | Status: DC

## 2022-06-01 MED ORDER — METHOCARBAMOL 750 MG PO TABS: 750.0000 mg | ORAL_TABLET | Freq: Four times a day (QID) | ORAL | 0 refills | Status: AC

## 2022-06-01 MED ORDER — PANTOPRAZOLE SODIUM 40 MG PO TBEC: 40.0000 mg | DELAYED_RELEASE_TABLET | Freq: Every day | ORAL | 1 refills | Status: DC

## 2022-06-01 MED ORDER — METHOCARBAMOL 500 MG PO TABS
750.0000 mg | ORAL_TABLET | Freq: Four times a day (QID) | ORAL | Status: DC
  Administered 2022-06-01: 750 mg via ORAL
  Filled 2022-06-01: qty 2

## 2022-06-01 MED ORDER — ASPIRIN 81 MG PO CHEW: 81.0000 mg | CHEWABLE_TABLET | Freq: Every day | ORAL | 0 refills | Status: DC

## 2022-06-01 MED ORDER — ROSUVASTATIN CALCIUM 40 MG PO TABS: 40.0000 mg | ORAL_TABLET | Freq: Every day | ORAL | 0 refills | Status: DC

## 2022-06-01 NOTE — Anesthesia Postprocedure Evaluation (Signed)
Anesthesia Post Note  [name]  Procedure(s) Performed: ESOPHAGOGASTRODUODENOSCOPY (EGD)  Patient location during evaluation: Endoscopy Anesthesia Type: General Level of consciousness: awake and alert Pain management: pain level controlled Vital Signs Assessment: post-procedure vital signs reviewed and stable Respiratory status: spontaneous breathing, nonlabored ventilation, respiratory function stable and patient connected to nasal cannula oxygen Cardiovascular status: blood pressure returned to baseline and stable Postop Assessment: no apparent nausea or vomiting Anesthetic complications: no   No notable events documented.   Last Vitals:  Vitals:   06/01/22 0519 06/01/22 0745  BP: 99/64 114/71  Pulse: 82 87  Resp: 16 17  Temp: 36.8 C 36.4 C  SpO2: 99% 97%    Last Pain:  Vitals:   06/01/22 0745  TempSrc: Axillary  PainSc:                  Martha Clan

## 2022-06-01 NOTE — Discharge Summary (Signed)
Physician Discharge Summary  Ethan Wells YJE:[id number] DOB: 30-Jan-1971 DOA: 05/28/2022  [facility]P: [facility]  Admit date: 05/28/2022 Discharge date: 06/01/2022  Admitted From: Home Disposition: Home  Recommendations for Outpatient Follow-up:  Follow up with PCP in 1-2 weeks Immediate cessation of all substance use Cardiology follow-up  Home Health: No Equipment/Devices: Rear wheel walker  Discharge Condition: Stable CODE STATUS: Full Diet recommendation: Heart healthy  Brief/Interim Summary: 51 y.o. male with medical history significant for CAD with history of STEMI complicated by V. fib arrest during cardiac cath, chronic diastolic heart failure (last known LVEF 55 to 60% from an echo which was done 01/22), history of migraine headache alcohol abuse and polysubstance abuse who presents to the ER for evaluation of a headache. He has a history of migraine headaches but states that this headache feels different.  He describes a throbbing headache involving the occipital as well as frontal lobes rated a 10 x 10 in intensity at its worst associated with nausea, vomiting and photosensitivity.  He normally takes [facility] powders for his headaches but has not been able to take any analgesics due to the nausea and vomiting.  According to the patient he took 2 Aleve tablets on the day of admission and threw them back up. He also complains of pain in the epigastrium as well as midsternal chest pain which started while he was in the ER.  Chest pain is rated a 4 x 10 in intensity at its worst and it is nonradiating.  Denies having any associated nausea, no vomiting, no diaphoresis or palpitations. Epigastric pain according to the patient radiates to his back and is rated a 5 x 10 in intensity at its worst. He admits to daily alcohol use as well as occasional NSAID use. He also admits to passing bright red blood per rectum for several weeks and thinks he may have hemorrhoids.  He denies  having any constipation or diarrhea.  He complains of feeling dizzy and lightheaded. He denies having any hematemesis or passage of melena stools. He denies having any fever, no chills, no neck stiffness or pain, no urinary symptoms, no cough, no leg swelling, no blurred vision no focal deficit. Labs reveal a drop in his hemoglobin from 15.2 g/dl (5 months ago) to 8.8 g/dl.  He also has hypokalemia with potassium of 3.2 Initial troponin is negative and twelve-lead EKG shows normal sinus rhythm with ST and T wave changes in the lateral leads. Chart review shows patient had an EGD in March, 2022 which showed reactive gastropathy and Barrett's esophagus   No clear evidence of ACS, NSTEMI, unstable angina.  High-sensitivity troponin negative.  Patient does endorse shortness of breath and chest pressure.  Hemoglobin dropped from 8.8 on admission to 7.5 on 6/14.  Will transfuse 1 unit blood.  GI consult requested    Status post EGD 6/15.  Erosive esophagitis noted.  Likely source of chronic blood loss.  Hemoglobin on postprocedure day #1 is 9.2.  No indication for repeat transfusion.  Patient continues to endorse chest pain.  Present consistently.  Radiation to left arm.  Hemoglobin stable post procedurally.  I had a lengthy discussion with the patient on the day of discharge.  He states that he is "not ready to leave yet" I explained that I could no longer justify continued inpatient stay given that we are no longer pursuing care that supports this level.  He expressed understanding but expressed a desire to appeal his discharge.  I explained that  that was done through insurance.  Engage case management spoke to patient.  Patient has [employer] and therefore is not eligible to appeal discharge.  Patient is high risk for readmission.  He has history of substance abuse although he was counseled on this extensively.  He also has a history of nonadherence to medical therapy and poor follow-up.  Given the  circumstances he will be high risk for readmission however at this point there is nothing further requiring inpatient stay.    Discharge Diagnoses:  Principal Problem:   Chest pain Active Problems:   CAD (coronary artery disease)   ABLA (acute blood loss anemia)   GI bleed   Esophageal reflux   Hypokalemia   TOBACCO ABUSE   Headache   Acute on chronic blood loss anemia  Chest pain Patient with a history of coronary artery disease who presents to the ER for evaluation of chest pain Chest pain is likely secondary to anemia.  No clear evidence of ACS at this time Troponins flat with no significant delta Also possible anginal component Plan: Aspirin resumed.  Attempted to add Imdur however patient cannot tolerate due to hypotension.  We will hold at this time.  Continue statin at time of discharge.  Follow-up outpatient PCP and cardiology   CAD (coronary artery disease) Treatment as outlined in 1   GI bleed Patient noted to have a drop in his H&H from 15 g/dl (5 months ago) to 8.8 g/dl He states that he has bright red blood per rectum over several weeks Also concern for possible NSAID induced gastritis Hemoglobin dropped to 7.5 on 6/14 1 unit PRBC transfused 6/14 EGD 6/15, erosive esophagitis noted Post procedure hemoglobin 9.2 Status post IV iron Plan:  Hemoglobin at goal.  No further transfusion at this time P.o. PPI twice daily   ABLA (acute blood loss anemia) Treatment as outlined in 2   Esophageal reflux P.o. PPI twice daily indefinitely   Hypokalemia Most likely secondary to GI losses from nausea and vomiting Supplement potassium Daily labs   TOBACCO ABUSE Smoking cessation discussed with patient in detail Offer nicotine patch   Headache Patient has a history of migraine headaches and presents for evaluation of a 4-day history of headaches which he states are different from his usual migraines and rated 10 x 10 in intensity at its worst. Received IV  Toradol, Decadron and Reglan in the ER without any significant improvement CT head negative Headache improved Plan: Headache resolved at time of discharge   Polysubstance abuse Patient cocaine positive with a history of alcohol use No clear evidence of withdrawal on my evaluation Plan: No evidence of withdrawal at the time of discharge.  Patient counseled extensively on the importance of substance abuse cessation.  Discharge Instructions  Discharge Instructions     Diet - low sodium heart healthy   Complete by: As directed    Increase activity slowly   Complete by: As directed       Allergies as of 06/01/2022       Reactions   Penicillins Hives   Other Rash   chives        Medication List     STOP taking these medications    Fish Oil 1000 MG Caps   gabapentin 300 MG capsule Commonly known as: NEURONTIN   hydrOXYzine 25 MG tablet Commonly known as: ATARAX   multivitamin with minerals Tabs tablet   sucralfate 1 GM/10ML suspension Commonly known as: CARAFATE       TAKE  these medications    aspirin 81 MG chewable tablet Chew 1 tablet (81 mg total) by mouth daily.   methocarbamol 750 MG tablet Commonly known as: ROBAXIN Take 1 tablet (750 mg total) by mouth 4 (four) times daily.   ondansetron 4 MG disintegrating tablet Commonly known as: Zofran ODT Take 1 tablet (4 mg total) by mouth every 8 (eight) hours as needed for nausea or vomiting.   pantoprazole 40 MG tablet Commonly known as: PROTONIX Take 1 tablet (40 mg total) by mouth daily.   rosuvastatin 40 MG tablet Commonly known as: CRESTOR Take 1 tablet (40 mg total) by mouth daily.               Durable Medical Equipment  (From admission, onward)           Start     Ordered   06/01/22 1339  For home use only DME Walker rolling  Once       Question Answer Comment  Walker: With 5 Inch Wheels   Patient needs a walker to treat with the following condition Weakness      06/01/22  1338            Allergies  Allergen Reactions   Penicillins Hives   Other Rash    chives    Consultations: GI   Procedures/Studies: CT HEAD WO CONTRAST (5MM)  Result Date: 05/28/2022 CLINICAL DATA:  Positional headache. New or worsening. Migraine. Started a week ago. Got worse today. Ten 10 pain. Patient reports history of migraines. EXAM: CT HEAD WITHOUT CONTRAST TECHNIQUE: Contiguous axial images were obtained from the base of the skull through the vertex without intravenous contrast. RADIATION DOSE REDUCTION: This exam was performed according to the departmental dose-optimization program which includes automated exposure control, adjustment of the mA and/or kV according to patient size and/or use of iterative reconstruction technique. COMPARISON:  CT brain 03/07/2011 FINDINGS: Brain: There is mild cortical atrophy, within normal limits for patient age. The ventricles are normal in configuration. The basilar cisterns are patent. No mass, mass effect, or midline shift. No acute intracranial hemorrhage is seen. No abnormal extra-axial fluid collection. Preservation of the normal cortical gray-white interface without CT evidence of an acute major vascular territorial cortical based infarction. Vascular: No hyperdense vessel or unexpected calcification. Skull: Normal. Negative for fracture or focal lesion. Sinuses/Orbits: The visualized orbits are unremarkable. The visualized paranasal sinuses and mastoid air cells are clear. Other: None. IMPRESSION: No acute intracranial process. Electronically Signed   By: Yvonne Kendall M.D.   On: 05/28/2022 18:14   US Abdomen Limited RUQ (LIVER/GB)  Result Date: 05/28/2022 CLINICAL DATA:  [id number].  Abdominal pain. EXAM: ULTRASOUND ABDOMEN LIMITED RIGHT UPPER QUADRANT COMPARISON:  None Available. FINDINGS: Gallbladder: No gallstones or wall thickening visualized. No sonographic Murphy sign noted by sonographer. Common bile duct: Diameter: 4 mm in proximal  diameter Liver: Hepatic parenchymal echogenicity is diffusely increased and there is poor acoustic through transmission in keeping with severe hepatic steatosis. Parenchymal changes appear progressive since prior examination. This limits the evaluation for detection of focal intrahepatic masses, though no definite focal intrahepatic masses are seen and there is no intrahepatic biliary ductal dilation. portal vein is patent on color Doppler imaging with normal direction of blood flow towards the liver. Other: None. IMPRESSION: Marked hepatic steatosis, progressive since prior examination. Electronically Signed   By: Fidela Salisbury M.D.   On: 05/28/2022 17:19   DG Chest 1 View  Result Date: 05/28/2022 CLINICAL DATA:  Provided history: Chest pain. EXAM: CHEST  1 VIEW COMPARISON:  Prior chest radiographs 12/21/2021 and earlier. Chest CT 08/07/2021. FINDINGS: Heart size at the upper limits of normal. No appreciable airspace consolidation or pulmonary edema. No evidence of pleural effusion or pneumothorax. No acute bony abnormality identified. IMPRESSION: No evidence of acute cardiopulmonary abnormality. Electronically Signed   By: Kellie Simmering D.O.   On: 05/28/2022 14:45      Subjective: Seen and examined on the day of discharge.  Stable no distress.  States he feels uncomfortable with leaving however had a lengthy discussion with him and explained that his current level of illness did not require continued inpatient stay and I can no longer justify it.  He does express understanding but did express a desire to appeal the discharge.  Discharge Exam: Vitals:   06/01/22 0745 06/01/22 1143  BP: 114/71 128/65  Pulse: 87 96  Resp: 17 15  Temp: 97.6 F (36.4 C) 97.7 F (36.5 C)  SpO2: 97% 97%   Vitals:   05/31/22 2324 06/01/22 0519 06/01/22 0745 06/01/22 1143  BP: (!) 100/56 99/64 114/71 128/65  Pulse: 88 82 87 96  Resp: '16 16 17 15  '$ Temp: 97.7 F (36.5 C) 98.2 F (36.8 C) 97.6 F (36.4 C) 97.7 F  (36.5 C)  TempSrc: Oral Oral Axillary   SpO2: 99% 99% 97% 97%  Weight:      Height:        General: Pt is alert, awake, not in acute distress Cardiovascular: RRR, S1/S2 +, no rubs, no gallops Respiratory: CTA bilaterally, no wheezing, no rhonchi Abdominal: Soft, NT, ND, bowel sounds + Extremities: no edema, no cyanosis    The results of significant diagnostics from this hospitalization (including imaging, microbiology, ancillary and laboratory) are listed below for reference.     Microbiology: No results found for this or any previous visit (from the past 240 hour(s)).   Labs: BNP (last 3 results) No results for input(s): "BNP" in the last 8760 hours. Basic Metabolic Panel: Recent Labs  Lab 05/28/22 1415 05/28/22 1819 05/29/22 0212  NA 138  --  133*  K 3.2*  --  3.5  CL 106  --  108  CO2 23  --  19*  GLUCOSE 113*  --  225*  BUN 6  --  7  CREATININE 0.53*  --  0.50*  CALCIUM 8.7*  --  8.1*  MG  --  1.7  --    Liver Function Tests: Recent Labs  Lab 05/28/22 1415  AST 138*  ALT 47*  ALKPHOS 287*  BILITOT 2.0*  PROT 7.8  ALBUMIN 3.1*   Recent Labs  Lab 05/28/22 1819  LIPASE 34   No results for input(s): "AMMONIA" in the last 168 hours. CBC: Recent Labs  Lab 05/28/22 1415 05/29/22 0212 05/29/22 0813 05/29/22 1514 05/31/22 0931  WBC 6.0 4.6  --   --  6.2  NEUTROABS 4.1  --   --   --   --   HGB 8.8* 7.5* 7.2* 8.1* 9.2*  HCT 27.7* 24.4* 22.9* 25.8* 29.4*  MCV 95.2 95.7  --   --  97.0  PLT 186 143*  --   --  137*   Cardiac Enzymes: No results for input(s): "CKTOTAL", "CKMB", "CKMBINDEX", "TROPONINI" in the last 168 hours. BNP: Invalid input(s): "POCBNP" CBG: Recent Labs  Lab 05/29/22 0114 05/30/22 0911  GLUCAP 257* 110*   D-Dimer No results for input(s): "DDIMER" in the last 72 hours. Hgb A1c  No results for input(s): "HGBA1C" in the last 72 hours. Lipid Profile No results for input(s): "CHOL", "HDL", "LDLCALC", "TRIG", "CHOLHDL",  "LDLDIRECT" in the last 72 hours. Thyroid function studies No results for input(s): "TSH", "T4TOTAL", "T3FREE", "THYROIDAB" in the last 72 hours.  Invalid input(s): "FREET3" Anemia work up No results for input(s): "VITAMINB12", "FOLATE", "FERRITIN", "TIBC", "IRON", "RETICCTPCT" in the last 72 hours. Urinalysis    Component Value Date/Time   COLORURINE AMBER (A) 05/28/2022 1819   APPEARANCEUR CLEAR (A) 05/28/2022 1819   APPEARANCEUR Clear 03/16/2012 1315   LABSPEC 1.013 05/28/2022 1819   LABSPEC 1.013 03/16/2012 1315   PHURINE 8.0 05/28/2022 1819   GLUCOSEU NEGATIVE 05/28/2022 1819   GLUCOSEU 50 mg/dL 03/16/2012 1315   HGBUR NEGATIVE 05/28/2022 1819   BILIRUBINUR NEGATIVE 05/28/2022 1819   BILIRUBINUR Negative 03/16/2012 1315   KETONESUR NEGATIVE 05/28/2022 1819   PROTEINUR NEGATIVE 05/28/2022 1819   NITRITE NEGATIVE 05/28/2022 1819   LEUKOCYTESUR NEGATIVE 05/28/2022 1819   LEUKOCYTESUR Negative 03/16/2012 1315   Sepsis Labs Recent Labs  Lab 05/28/22 1415 05/29/22 0212 05/31/22 0931  WBC 6.0 4.6 6.2   Microbiology No results found for this or any previous visit (from the past 240 hour(s)).   Time coordinating discharge: Over 30 minutes  SIGNED:   Sidney Ace, MD  [facility] 06/01/2022, 1:52 PM Pager   If 7PM-7AM, please contact night-coverage

## 2022-06-01 NOTE — Evaluation (Signed)
Physical Therapy Evaluation Patient Details Name: Ethan Wells MRN: [id number] DOB: [birth year] Today's Date: 06/01/2022  History of Present Illness  51 y.o. male with medical history significant for CAD with history of STEMI complicated by V. fib arrest during cardiac cath, chronic diastolic heart failure (last known LVEF 55 to 60% from an echo which was done 01/22), history of migraine headache alcohol abuse and polysubstance abuse who presents to the ER for evaluation of a headache.No clear evidence of ACS, NSTEMI, unstable angina.  High-sensitivity troponin negative.  Patient does endorse shortness of breath and chest pressure.  Hemoglobin dropped from 8.8 on admission to 7.5 on 6/14.   GI consult requested    Clinical Impression  Pt received in Semi-Fowler's position and agreeable to therapy.  Pt performed bed mobility with good technique, and attempted to ambulate in the room with CGA only, no AD.  Pt struggled with mobility and no AD having to furniture surf in order to prevent LOB.  Pt notes increased dizziness that has not resolved since yesterday.  Pt given FWW and was much more stable with ambulation, and could use UE for support due to generalized deconditioning of the LE's.  Pt does still remain partially unsteady during ambulation, but does much better with FWW.  Pt then transferred back to bed will all needs met and call bell within reach.  Current discharge plans to home with HHPT are appropriate at this time, but understand that pt does not have a home a this time.  Pt will continue to benefit from skilled therapy in order to address deficits listed below.        Recommendations for follow up therapy are one component of a multi-disciplinary discharge planning process, led by the attending physician.  Recommendations may be updated based on patient status, additional functional criteria and insurance authorization.  Follow Up Recommendations Home health PT (HHPT recommended, but  understand that pt does not have a home currently.)    Assistance Recommended at Discharge PRN  Patient can return home with the following  A little help with walking and/or transfers    Equipment Recommendations Rolling walker (2 wheels)  Recommendations for Other Services       Functional Status Assessment Patient has had a recent decline in their functional status and demonstrates the ability to make significant improvements in function in a reasonable and predictable amount of time.     Precautions / Restrictions Precautions Precautions: Fall Precaution Comments: watch bp Restrictions Weight Bearing Restrictions: No      Mobility  Bed Mobility Overal bed mobility: Modified Independent                  Transfers Overall transfer level: Needs assistance   Transfers: Sit to/from Stand Sit to Stand: Supervision                Ambulation/Gait Ambulation/Gait assistance: Min guard Gait Distance (Feet): 160 Feet Assistive device: None, Rolling walker (2 wheels) Gait Pattern/deviations: Step-through pattern Gait velocity: decreased     General Gait Details: Pt has instability with gait initially with no AD, then when given walker, much more stable, but still expereincing difficulty with ambulating.  Stairs            Wheelchair Mobility    Modified Rankin (Stroke Patients Only)       Balance Overall balance assessment: Needs assistance Sitting-balance support: Feet supported Sitting balance-Leahy Scale: Normal       Standing balance-Leahy Scale: Good  Pertinent Vitals/Pain Pain Assessment Pain Assessment: 0-10 Pain Score: 5  Pain Location: generalized Pain Descriptors / Indicators: Aching, Grimacing, Guarding Pain Intervention(s): Limited activity within patient's tolerance, Monitored during session, Repositioned    Home Living Family/patient expects to be discharged to:: Unsure                    Additional Comments: pt was living in motel PTA    Prior Function Prior Level of Function : Independent/Modified Independent             Mobility Comments: pt reports at least one fall in the last six months, reports he does not amb with walker/cane ADLs Comments: pt reports MOD I with ADL/IADL     Hand Dominance   Dominant Hand: Right    Extremity/Trunk Assessment   Upper Extremity Assessment Upper Extremity Assessment: Overall WFL for tasks assessed    Lower Extremity Assessment Lower Extremity Assessment: Generalized weakness    Cervical / Trunk Assessment Cervical / Trunk Assessment: Normal  Communication   Communication: No difficulties  Cognition Arousal/Alertness: Awake/alert Behavior During Therapy: WFL for tasks assessed/performed Overall Cognitive Status: Within Functional Limits for tasks assessed                                          General Comments      Exercises     Assessment/Plan    PT Assessment Patient needs continued PT services  PT Problem List Decreased strength;Decreased activity tolerance;Decreased balance;Decreased mobility       PT Treatment Interventions DME instruction;Gait training;Functional mobility training;Therapeutic activities;Therapeutic exercise;Balance training;Neuromuscular re-education    PT Goals (Current goals can be found in the Care Plan section)  Acute Rehab PT Goals Patient Stated Goal: to get better while in the hospital. PT Goal Formulation: With patient Time For Goal Achievement: 06/15/22 Potential to Achieve Goals: Good    Frequency Min 2X/week     Co-evaluation               AM-PAC PT "6 Clicks" Mobility  Outcome Measure Help needed turning from your back to your side while in a flat bed without using bedrails?: None Help needed moving from lying on your back to sitting on the side of a flat bed without using bedrails?: None Help needed moving to and from  a bed to a chair (including a wheelchair)?: A Little Help needed standing up from a chair using your arms (e.g., wheelchair or bedside chair)?: A Little Help needed to walk in hospital room?: A Little Help needed climbing 3-5 steps with a railing? : A Little 6 Click Score: 20    End of Session Equipment Utilized During Treatment: Gait belt Activity Tolerance: Patient tolerated treatment well Patient left: in bed;with call bell/phone within reach;with bed alarm set Nurse Communication: Mobility status PT Visit Diagnosis: Unsteadiness on feet (R26.81);Other abnormalities of gait and mobility (R26.89);Muscle weakness (generalized) (M62.81);Difficulty in walking, not elsewhere classified (R26.2)    Time: 9024-0973 PT Time Calculation (min) (ACUTE ONLY): 28 min   Charges:   PT Evaluation $PT Eval Low Complexity: 1 Low PT Treatments $Gait Training: 23-37 mins        Gwenlyn Saran, PT, DPT 06/01/22, 1:59 PM   Christie Nottingham 06/01/2022, 1:56 PM

## 2022-06-01 NOTE — TOC Transition Note (Addendum)
Transition of Care [facility]) - CM/SW Discharge Note   Patient Details  Name: Ethan Wells MRN: [id number] Date of Birth: 10/17/1971  [facility] CM/SW Contact:  Harriet Masson, RN Phone [phone] 06/01/2022, 1:40 PM   Clinical Narrative:    Plan discharge today: Spoke with pt concerning his d/c plans. Pt mentioned possible appeal however confirmed pt has [facility] and informed pt he may not qualify for the appeal process with [employer].  Pt mentioned he is suppose to obtain a RW recommended by PT. Contacted [employer] with the referral after confirming with PT as a recommendation for DME. PT also indicated pt lives at a hotel. Pt states he has a fx/friend on their way to the hospital who is their POA not sure of credentials. Note Substance abuse and shelter have been provided to pt on yesterday per TOC notes. No other request or needs at this time.  Team has been updated with no additional needs via TOC.  Addendum: Confirmed pt does qualify for an appeal and was provided letter on yesterday. Pt made aware of the process and requested all information once again. RN provided pt another copy of the information provided along with the Substance abuse and shelter resources. No additional information requested.   Final next level of care: Home/Self Care Barriers to Discharge: Continued Medical Work up   Patient Goals and CMS Choice        Discharge Placement                    Patient and family notified of of transfer: 06/01/22 (Spoke with pt directly)  Discharge Plan and Services     Post Acute Care Choice: NA          DME Arranged: Walker rolling DME Agency: [employer] Date DME Agency Contacted: 06/01/22 Time DME Agency Contacted: [id number] Representative spoke with at DME Agency: [place] ([place]) Interventions     Readmission Risk Interventions     No data to display

## 2022-06-03 ENCOUNTER — Encounter: Payer: Self-pay | Admitting: Gastroenterology

## 2022-06-03 LAB — SURGICAL PATHOLOGY

## 2022-06-04 ENCOUNTER — Telehealth: Payer: Self-pay

## 2022-06-04 NOTE — Telephone Encounter (Signed)
Called patient and left a message for call back  

## 2022-06-04 NOTE — Telephone Encounter (Signed)
-----   Message from Lin Landsman, MD sent at 06/04/2022 12:31 PM EDT ----- Caryl Pina  Please reach out to him for repeat EGD in 3 months as well as he needs hospital follow-up appointment next available  He should be taking Protonix 40 mg p.o. twice daily before meals for long segment Barrett's esophagus and severe erosive esophagitis.  He is currently on once a day only.  Please send in the right prescription for him  Rohini Vanga

## 2022-06-05 NOTE — Telephone Encounter (Signed)
Called and left a message for call back  

## 2022-06-05 NOTE — Telephone Encounter (Signed)
Called and left a message for call back. No DPR is on file so unable to call any one else and give them information about patient. Called emergency contact and left a message for call back to tell patient to give [place] a call

## 2022-06-06 NOTE — Telephone Encounter (Signed)
Called and left a message for call back. Sending letter to patient since unable to reach patient.

## 2022-07-08 ENCOUNTER — Ambulatory Visit: Payer: Medicare HMO | Admitting: Cardiovascular Disease

## 2022-07-08 NOTE — Progress Notes (Deleted)
No chief complaint on file.  History of Present Illness: 51 yo male with history of tobacco abuse, cocaine abuse, etoh abuse, CAD, ischemic cardiomyopathy here today for follow up. He was admitted to [facility] 02/06/11 with acute anterior wall STEMI. HIs LAD was occluded. A bare metal stent was placed. EF of 25-30% by echo with akinesis of the anteroseptal wall and apex with f/u echo 7/12 with LVEF=45-50%.  At the office visit in 2014, he c/o severe chest pains. Cardiac cath was arranged but was not completed as he did not show up for this procedure. Admitted to [facility] in July 2020 and June 2021 with chest pain felt to be atypical and not cardiac related. Cocaine cessation advised. He was admitted to [facility] January 2022 with dyspnea, back pain. He did not have chest pain. Troponin was mildly elevated. Chest CTA negative for PE. UDS positive for cocaine and opiates. Echo with January 2022 with LVEF=55-60% and no valve disease. Cardiology consult team saw him there and did not feel that his pain was cardiac in nature. He was advised to stop using cocaine. He is disabled. He was seen in our office in March 2022 and he had no c/o chest pain. He continued to use etoh, tobacco and cocaine. He had been referred to the pain clinic for diffuse pains.   He is here today for follow up. The patient denies any chest pain, dyspnea, palpitations, lower extremity edema, orthopnea, PND, dizziness, near syncope or syncope.    Primary Care Physician:  [facility]  Past Medical History:  Diagnosis Date   CAD (coronary artery disease)    a. 01/2011 Anterior STEMI/Cath/PCI: LM nl, LAD 100d (3.5x81m Vision BMS placed), LCX 238mRI 50, RCA min irregs, EF 40% w/ apical, inferoapical HK.   Cardiac arrest - ventricular fibrillation    a. In setting of STEMI 01/2011   Chronic pain    [place]   Depression with anxiety    ETOH abuse    a. 6-12 beers/day   GERD (gastroesophageal reflux  disease)    Hemorrhoids    Hyperlipidemia    Hypertension    Ischemic cardiomyopathy    a. 06/2011 Echo: EF 45-50%, No rwma   Marijuana abuse    Migraines    Tobacco abuse    a. 1/2 ppd x 26 yrs    Past Surgical History:  Procedure Laterality Date   COLONOSCOPY  08/09/2021   Procedure: COLONOSCOPY;  Surgeon: WoLucilla LameMD;  [facility]  Service: Endoscopy;;   CORONARY ANGIOPLASTY WITH STENT PLACEMENT     ESOPHAGOGASTRODUODENOSCOPY N/A 05/30/2022   Procedure: ESOPHAGOGASTRODUODENOSCOPY (EGD);  Surgeon: VaLin LandsmanMD;  [facility];  Service: Gastroenterology;  Laterality: N/A;   ESOPHAGOGASTRODUODENOSCOPY (EGD) WITH PROPOFOL N/A 03/01/2021   Procedure: ESOPHAGOGASTRODUODENOSCOPY (EGD) WITH PROPOFOL;  Surgeon: VaLin LandsmanMD;  [place];  Laterality: N/A;   ESOPHAGOGASTRODUODENOSCOPY (EGD) WITH PROPOFOL N/A 08/09/2021   Procedure: ESOPHAGOGASTRODUODENOSCOPY (EGD) WITH PROPOFOL;  Surgeon: WoLucilla LameMD;  [facility]  Service: Endoscopy;  Laterality: N/A;    Current Outpatient Medications  Medication Sig Dispense Refill   aspirin 81 MG chewable tablet Chew 1 tablet (81 mg total) by mouth daily. 30 tablet 0   ondansetron (ZOFRAN ODT) 4 MG disintegrating tablet Take 1 tablet (4 mg total) by mouth every 8 (eight) hours as needed for nausea or vomiting. 20 tablet 0   pantoprazole (PROTONIX) 40 MG  tablet Take 1 tablet (40 mg total) by mouth daily. 30 tablet 1   rosuvastatin (CRESTOR) 40 MG tablet Take 1 tablet (40 mg total) by mouth daily. 30 tablet 0   No current facility-administered medications for this visit.    Allergies  Allergen Reactions   Penicillins Hives   Other Rash    chives    Social History   Socioeconomic History   Marital status: Single    Spouse name: Not on file   Number of children: Not on file   Years of education: Not on file   Highest education level: Not  on file  Occupational History   Not on file  Tobacco Use   Smoking status: Every Day    Packs/day: 0.50    Years: 41.00    Total pack years: 20.50    Types: Cigarettes   Smokeless tobacco: Never   Tobacco comments:    started age 74  Vaping Use   Vaping Use: Never used  Substance and Sexual Activity   Alcohol use: Yes    Comment: at least a 6 pack of beer daily and often a 12 pack   Drug use: Not Currently    Types: Cocaine, Marijuana    Comment: (02/22/21 - pt reports none last 6 mos) cocaine used several weeks ago; marijuana today   Sexual activity: Not on file  Other Topics Concern   Not on file  Social History Narrative   Single, lives in [place] with his mother.  He does not routinely exercise.  He sometimes works, helping to deliver appliances to homes.   Social Determinants of Health   Financial Resource Strain: Not on file  Food Insecurity: Not on file  Transportation Needs: Not on file  Physical Activity: Not on file  Stress: Not on file  Social Connections: Not on file  Intimate Partner Violence: Not on file    Family History  Problem Relation Age of Onset   Coronary artery disease Mother        alive   Other Other        2 sisters alive and well   Alcohol abuse Father        died @ 57   Other Paternal Grandmother        PACER    Review of Systems:  As stated in the HPI and otherwise negative.   There were no vitals taken for this visit.  Physical Examination: General: Well developed, well nourished, NAD  HEENT: OP clear, mucus membranes moist  SKIN: warm, dry. No rashes. Neuro: No focal deficits  Musculoskeletal: Muscle strength 5/5 all ext  Psychiatric: Mood and affect normal  Neck: No JVD, no carotid bruits, no thyromegaly, no lymphadenopathy.  Lungs:Clear bilaterally, no wheezes, rhonci, crackles Cardiovascular: Regular rate and rhythm. No murmurs, gallops or rubs. Abdomen:Soft. Bowel sounds present. Non-tender.  Extremities: No lower  extremity edema. Pulses are 2 + in the bilateral DP/PT.  Echo January 2022:  1. Left ventricular ejection fraction, by estimation, is 55 to 60%. The  left ventricle has normal function. The left ventricle has no regional  wall motion abnormalities. Left ventricular diastolic parameters were  normal.   2. Right ventricular systolic function is normal. The right ventricular  size is normal.   3. The mitral valve is normal in structure. Trivial mitral valve  regurgitation. No evidence of mitral stenosis.   4. The aortic valve is normal in structure. Aortic valve regurgitation is  not visualized. No aortic stenosis  is present.   5. The inferior vena cava is normal in size with greater than 50%  respiratory variability, suggesting right atrial pressure of 3 mmHg.   Lipid Panel     Component Value Date/Time   CHOL 147 06/05/2020 0203   CHOL 174 03/09/2013 0107   TRIG 115 06/05/2020 0203   TRIG 111 03/09/2013 0107   HDL 21 (L) 06/05/2020 0203   HDL 59 03/09/2013 0107   CHOLHDL 7.0 06/05/2020 0203   VLDL 23 06/05/2020 0203   VLDL 22 03/09/2013 0107   LDLCALC 103 (H) 06/05/2020 0203   LDLCALC 93 03/09/2013 0107    Assessment and Plan:   1. CAD without angina: No chest pain suggestive of angina. He is known to have CAD with MI in 2012. LV function normal by echo in January 2022. Continue ASA and statin. No recent LDL. ***  2. Tobacco abuse: Smoking cessation advised  3. Polysubstance abuse: Complete cessation of etoh and cocaine advised.    Lauree Chandler 07/08/2022 7:35 AM

## 2022-08-19 ENCOUNTER — Emergency Department
Admission: EM | Admit: 2022-08-19 | Discharge: 2022-08-19 | Disposition: A | Discharge status: Home / Self Care | Payer: Medicare HMO | Attending: Emergency Medicine | Admitting: Emergency Medicine

## 2022-08-19 ENCOUNTER — Emergency Department: Payer: Medicare HMO

## 2022-08-19 ENCOUNTER — Other Ambulatory Visit: Payer: Self-pay

## 2022-08-19 ENCOUNTER — Encounter: Payer: Self-pay | Admitting: Intensive Care

## 2022-08-19 DIAGNOSIS — Y901 Blood alcohol level of 20-39 mg/100 ml: Secondary | ICD-10-CM | POA: Insufficient documentation

## 2022-08-19 DIAGNOSIS — I5030 Unspecified diastolic (congestive) heart failure: Secondary | ICD-10-CM | POA: Insufficient documentation

## 2022-08-19 DIAGNOSIS — R1010 Upper abdominal pain, unspecified: Secondary | ICD-10-CM | POA: Diagnosis present

## 2022-08-19 DIAGNOSIS — G43909 Migraine, unspecified, not intractable, without status migrainosus: Secondary | ICD-10-CM | POA: Diagnosis not present

## 2022-08-19 DIAGNOSIS — R109 Unspecified abdominal pain: Secondary | ICD-10-CM | POA: Diagnosis not present

## 2022-08-19 DIAGNOSIS — I10 Essential (primary) hypertension: Secondary | ICD-10-CM | POA: Diagnosis not present

## 2022-08-19 DIAGNOSIS — F17210 Nicotine dependence, cigarettes, uncomplicated: Secondary | ICD-10-CM | POA: Diagnosis not present

## 2022-08-19 DIAGNOSIS — R519 Headache, unspecified: Secondary | ICD-10-CM | POA: Diagnosis not present

## 2022-08-19 DIAGNOSIS — R079 Chest pain, unspecified: Secondary | ICD-10-CM | POA: Diagnosis not present

## 2022-08-19 DIAGNOSIS — K2920 Alcoholic gastritis without bleeding: Secondary | ICD-10-CM | POA: Diagnosis not present

## 2022-08-19 DIAGNOSIS — I2510 Atherosclerotic heart disease of native coronary artery without angina pectoris: Secondary | ICD-10-CM | POA: Diagnosis not present

## 2022-08-19 DIAGNOSIS — I110 Hypertensive heart disease with heart failure: Secondary | ICD-10-CM | POA: Diagnosis not present

## 2022-08-19 DIAGNOSIS — K2900 Acute gastritis without bleeding: Secondary | ICD-10-CM | POA: Diagnosis not present

## 2022-08-19 DIAGNOSIS — R000 Tachycardia, unspecified: Secondary | ICD-10-CM | POA: Diagnosis not present

## 2022-08-19 DIAGNOSIS — G8929 Other chronic pain: Secondary | ICD-10-CM

## 2022-08-19 DIAGNOSIS — R45851 Suicidal ideations: Secondary | ICD-10-CM | POA: Diagnosis not present

## 2022-08-19 DIAGNOSIS — R1013 Epigastric pain: Secondary | ICD-10-CM | POA: Diagnosis not present

## 2022-08-19 DIAGNOSIS — F1090 Alcohol use, unspecified, uncomplicated: Secondary | ICD-10-CM | POA: Diagnosis not present

## 2022-08-19 LAB — COMPREHENSIVE METABOLIC PANEL
ALT: 28 U/L (ref 0–44)
AST: 84 U/L — ABNORMAL HIGH (ref 15–41)
Albumin: 3.5 g/dL (ref 3.5–5.0)
Alkaline Phosphatase: 265 U/L — ABNORMAL HIGH (ref 38–126)
Anion gap: 10 (ref 5–15)
BUN: <5 mg/dL — ABNORMAL LOW (ref 6–20)
CO2: 20 mmol/L — ABNORMAL LOW (ref 22–32)
Calcium: 8.6 mg/dL — ABNORMAL LOW (ref 8.9–10.3)
Chloride: 106 mmol/L (ref 98–111)
Creatinine, Ser: 0.68 mg/dL (ref 0.61–1.24)
GFR, Estimated: >60 mL/min (ref >60)
Glucose, Bld: 112 mg/dL — ABNORMAL HIGH (ref 70–99)
Potassium: 3.4 mmol/L — ABNORMAL LOW (ref 3.5–5.1)
Sodium: 136 mmol/L (ref 135–145)
Total Bilirubin: 1.1 mg/dL (ref 0.3–1.2)
Total Protein: 8.4 g/dL — ABNORMAL HIGH (ref 6.5–8.1)

## 2022-08-19 LAB — CBC
HCT: 35.6 % — ABNORMAL LOW (ref 39.0–52.0)
Hemoglobin: 11.2 g/dL — ABNORMAL LOW (ref 13.0–17.0)
MCH: 29.2 pg (ref 26.0–34.0)
MCHC: 31.5 g/dL (ref 30.0–36.0)
MCV: 93 fL (ref 80.0–100.0)
Platelets: 248 10*3/uL (ref 150–400)
RBC: 3.83 MIL/uL — ABNORMAL LOW (ref 4.22–5.81)
RDW: 17.8 % — ABNORMAL HIGH (ref 11.5–15.5)
WBC: 8.6 10*3/uL (ref 4.0–10.5)
nRBC: 0 % (ref 0.0–0.2)

## 2022-08-19 LAB — URINE DRUG SCREEN, QUALITATIVE (ARMC ONLY)
Amphetamines, Ur Screen: NOT DETECTED
Barbiturates, Ur Screen: NOT DETECTED
Benzodiazepine, Ur Scrn: NOT DETECTED
Cannabinoid 50 Ng, Ur ~~LOC~~: NOT DETECTED
Cocaine Metabolite,Ur ~~LOC~~: POSITIVE — AB
MDMA (Ecstasy)Ur Screen: NOT DETECTED
Methadone Scn, Ur: NOT DETECTED
Opiate, Ur Screen: NOT DETECTED
Phencyclidine (PCP) Ur S: NOT DETECTED
Tricyclic, Ur Screen: NOT DETECTED

## 2022-08-19 LAB — TROPONIN I (HIGH SENSITIVITY): Troponin I (High Sensitivity): 7 ng/L (ref <18)

## 2022-08-19 LAB — LIPASE, BLOOD: Lipase: 29 U/L (ref 11–51)

## 2022-08-19 LAB — ACETAMINOPHEN LEVEL: Acetaminophen (Tylenol), Serum: <10 ug/mL — ABNORMAL LOW (ref 10–30)

## 2022-08-19 LAB — ETHANOL: Alcohol, Ethyl (B): 23 mg/dL — ABNORMAL HIGH (ref <10)

## 2022-08-19 LAB — SALICYLATE LEVEL: Salicylate Lvl: <7 mg/dL — ABNORMAL LOW (ref 7.0–30.0)

## 2022-08-19 MED ORDER — ROSUVASTATIN CALCIUM 20 MG PO TABS
40.0000 mg | ORAL_TABLET | Freq: Every day | ORAL | Status: DC
  Filled 2022-08-19: qty 2

## 2022-08-19 MED ORDER — PROCHLORPERAZINE EDISYLATE 10 MG/2ML IJ SOLN
10.0000 mg | Freq: Once | INTRAMUSCULAR | Status: AC
  Administered 2022-08-19: 10 mg via INTRAVENOUS
  Filled 2022-08-19: qty 2

## 2022-08-19 MED ORDER — LORAZEPAM 2 MG/ML IJ SOLN: 0.0000 mg | Freq: Four times a day (QID) | INTRAMUSCULAR | Status: DC

## 2022-08-19 MED ORDER — LORAZEPAM 2 MG/ML IJ SOLN: 0.0000 mg | Freq: Two times a day (BID) | INTRAMUSCULAR | Status: DC

## 2022-08-19 MED ORDER — PANTOPRAZOLE SODIUM 40 MG PO TBEC: 40.0000 mg | DELAYED_RELEASE_TABLET | Freq: Every day | ORAL | Status: DC

## 2022-08-19 MED ORDER — LORAZEPAM 2 MG PO TABS: 0.0000 mg | ORAL_TABLET | Freq: Two times a day (BID) | ORAL | Status: DC

## 2022-08-19 MED ORDER — THIAMINE MONONITRATE 100 MG PO TABS
100.0000 mg | ORAL_TABLET | Freq: Every day | ORAL | Status: DC
  Administered 2022-08-19: 100 mg via ORAL
  Filled 2022-08-19: qty 1

## 2022-08-19 MED ORDER — THIAMINE HCL 100 MG/ML IJ SOLN: 100.0000 mg | Freq: Every day | INTRAMUSCULAR | Status: DC

## 2022-08-19 MED ORDER — LIDOCAINE VISCOUS HCL 2 % MT SOLN
15.0000 mL | Freq: Once | OROMUCOSAL | Status: AC
  Administered 2022-08-19: 15 mL via ORAL
  Filled 2022-08-19: qty 15

## 2022-08-19 MED ORDER — LORAZEPAM 2 MG PO TABS
0.0000 mg | ORAL_TABLET | Freq: Four times a day (QID) | ORAL | Status: DC
  Administered 2022-08-19: 2 mg via ORAL
  Filled 2022-08-19: qty 1

## 2022-08-19 MED ORDER — ASPIRIN 81 MG PO CHEW: 81.0000 mg | CHEWABLE_TABLET | Freq: Every day | ORAL | Status: DC

## 2022-08-19 MED ORDER — ALUM & MAG HYDROXIDE-SIMETH 200-200-20 MG/5ML PO SUSP
30.0000 mL | Freq: Once | ORAL | Status: AC
  Administered 2022-08-19: 30 mL via ORAL
  Filled 2022-08-19: qty 30

## 2022-08-19 MED ORDER — DIPHENHYDRAMINE HCL 50 MG/ML IJ SOLN
25.0000 mg | Freq: Once | INTRAMUSCULAR | Status: AC
  Administered 2022-08-19: 25 mg via INTRAVENOUS
  Filled 2022-08-19: qty 1

## 2022-08-19 NOTE — ED Notes (Signed)
Vol /psych consult ordered/ pending

## 2022-08-19 NOTE — ED Notes (Signed)
Pt is reporting headache and epigastric pain for the last week that has now started again. Secure chat message to Dr. Jari Pigg to notify as pt is up for DC. Will follow up.

## 2022-08-19 NOTE — BH Assessment (Signed)
Comprehensive Clinical Assessment (CCA) Screening, Triage and Referral Note  08/19/2022 Ethan Wells [id number]  Debera Lat, 51 year old male who presents to  [facility] ED voluntarily for treatment. Per triage note, Patient c/o being suicidal and depressed X5-6 days. Psych history but reports he does not take medicines. C/o chronic back pain, abdominal pain for a few days, and migraine. Reports he has not eaten in about a week, has just been drinking and using drugs. Drinks on average 12 pack a day or liquor. Last used cocaine 5 days ago.   During TTS assessment pt presents alert and oriented x 4, restless but cooperative, and mood-congruent with affect. The pt does not appear to be responding to internal or external stimuli. Neither is the pt presenting with any delusional thinking. Pt verified the information provided to triage RN.   Pt identifies his main complaint to be that he drinks daily, uses cocaine and not feeling well. Patient reports symptoms of migraine headaches and loss of appetite. Patient reports his drinking has worsened over the past couple of weeks. Patient states he drank 4 beers on yesterday. Patient states he is living with his cousin. Pt denies current SI/HI/AH/VH.    Per Dr. Roselyn Reef, NP, pt shows no evidence of imminent risk to self or others at present and does not meet criteria for psychiatric inpatient admission. TTS provided patient's nurse with detox/rehab facilities.    Chief Complaint:  Chief Complaint  Patient presents with   Suicidal   Visit Diagnosis: Alcohol use disorder  Patient Reported Information How did you hear about [place]? Self  What Is the Reason for Your Visit/Call Today? Patient reports wanting detox/rehab for alcohol and cocaine use.  How Long Has This Been Causing You Problems? > than 6 months  What Do You Feel Would Help You the Most Today? Alcohol or Drug Use Treatment   Have You Recently Had Any Thoughts About Hurting Yourself?  No  Are You Planning to Commit Suicide/Harm Yourself At This time? No   Have you Recently Had Thoughts About [place]? No  Are You Planning to Harm Someone at This Time? No  Explanation: No data recorded  Have You Used Any Alcohol or Drugs in the Past 24 Hours? Yes  How Long Ago Did You Use Drugs or Alcohol? No data recorded What Did You Use and How Much? Alcohol- 4 beers   Do You Currently Have a Therapist/Psychiatrist? No  Name of Therapist/Psychiatrist: No data recorded  Have You Been Recently Discharged From Any Office Practice or Programs? No  Explanation of Discharge From Practice/Program: No data recorded   CCA Screening Triage Referral Assessment Type of Contact: Face-to-Face  Telemedicine Service Delivery:   Is this Initial or Reassessment? No data recorded Date Telepsych consult ordered in [facility]:  No data recorded Time Telepsych consult ordered in [facility]:  No data recorded Location of Assessment: [facility] ED  Provider Location: [facility] ED   Collateral Involvement: None provided   Does Patient Have a [place]? No data recorded Name and Contact of Legal Guardian: No data recorded If Minor and Not Living with Parent(s), Who has Custody? n/a  Is CPS involved or ever been involved? No data recorded Is APS involved or ever been involved? No data recorded  Patient Determined To Be At Risk for Harm To Self or Others Based on Review of Patient Reported Information or Presenting Complaint? No  Method: No data recorded Availability of Means: No data recorded Intent: No data  recorded Notification Required: No data recorded Additional Information for Danger to Others Potential: No data recorded Additional Comments for Danger to Others Potential: No data recorded Are There Guns or Other Weapons in Your Home? No data recorded Types of Guns/Weapons: No data recorded Are These Weapons Safely Secured?                            No data  recorded Who Could Verify You Are Able To Have These Secured: No data recorded Do You Have any Outstanding Charges, Pending Court Dates, Parole/Probation? No data recorded Contacted To Inform of Risk of Harm To Self or Others: No data recorded  Does Patient Present under Involuntary Commitment? No  IVC Papers Initial File Date: No data recorded  [place] of Residence:    Patient Currently Receiving the Following Services: Not Receiving Services   Determination of Need: Emergent (2 hours)   Options For Referral: ED Visit; Chemical Dependency Intensive Outpatient Therapy (CDIOP); Outpatient Therapy   Discharge Disposition:     Eula Fried, Counselor, LCAS-A

## 2022-08-19 NOTE — ED Notes (Signed)
Dr Jari Pigg educated pt on DC after clear results. Pt agrees. DC completed by this nurse, signature pad is not working at this time. Pt returned bags 2 of 2. Ambulates to ED on own with this nruse

## 2022-08-19 NOTE — Consult Note (Signed)
[facility] Consult   Reason for Consult:  alcohol abuse Referring Physician:  EDP Patient Identification: Ethan Wells MRN:  [id number] Principal Diagnosis: Alcohol use disorder Diagnosis:  Principal Problem:   Alcohol use disorder   Total Time spent with patient: 45 minutes  Subjective:   Ethan Wells is a 51 y.o. male patient admitted with alcohol use disorder.  HPI:  51 yo male presented for alcohol detox, his drinking has increased in the last few weeks.  He drank 1/2 gallon of liquor last week along with some beers.  Denies seizures, black outs or other complications with withdrawal symptoms.  He also uses crack on a weekly basis, complains of chest pain.  Denies suicidal/homicidal ideations, hallucinations.    Past Psychiatric History: alcohol use disorder  Risk to Self:  none Risk to Others:  none Prior Inpatient Therapy:  rehabs Prior Outpatient Therapy:  none  Past Medical History:  Past Medical History:  Diagnosis Date   CAD (coronary artery disease)    a. 01/2011 Anterior STEMI/Cath/PCI: LM nl, LAD 100d (3.5x59m Vision BMS placed), LCX 250mRI 50, RCA min irregs, EF 40% w/ apical, inferoapical HK.   Cardiac arrest - ventricular fibrillation    a. In setting of STEMI 01/2011   Chronic pain    Cocaine abuse ([place]   Depression with anxiety    ETOH abuse    a. 6-12 beers/day   GERD (gastroesophageal reflux disease)    Hemorrhoids    Hyperlipidemia    Hypertension    Ischemic cardiomyopathy    a. 06/2011 Echo: EF 45-50%, No rwma   Marijuana abuse    Migraines    Tobacco abuse    a. 1/2 ppd x 26 yrs    Past Surgical History:  Procedure Laterality Date   COLONOSCOPY  08/09/2021   Procedure: COLONOSCOPY;  Surgeon: WoLucilla LameMD;  [facility];  Service: Endoscopy;;   CORONARY ANGIOPLASTY WITH STENT PLACEMENT     ESOPHAGOGASTRODUODENOSCOPY N/A 05/30/2022   Procedure: ESOPHAGOGASTRODUODENOSCOPY (EGD);  Surgeon: VaLin LandsmanMD;  [facility];  Service: Gastroenterology;  Laterality: N/A;   ESOPHAGOGASTRODUODENOSCOPY (EGD) WITH PROPOFOL N/A 03/01/2021   Procedure: ESOPHAGOGASTRODUODENOSCOPY (EGD) WITH PROPOFOL;  Surgeon: VaLin LandsmanMD;  [facility] Service: End[facility];  Laterality: N/A;   ESOPHAGOGASTRODUODENOSCOPY (EGD) WITH PROPOFOL N/A 08/09/2021   Procedure: ESOPHAGOGASTRODUODENOSCOPY (EGD) WITH PROPOFOL;  Surgeon: WoLucilla LameMD;  [facility];  Service: Endoscopy;  Laterality: N/A;   Family History:  Family History  Problem Relation Age of Onset   Coronary artery disease Mother        alive   Other Other        2 sisters alive and well   Alcohol abuse Father        died @ 5940 Other Paternal Grandmother        PACER   Family Psychiatric  History: none Social History:  Social History   Substance and Sexual Activity  Alcohol Use Yes   Alcohol/week: 12.0 standard drinks of alcohol   Types: 12 Cans of beer per week   Comment: at least a 6 pack of beer daily and often a 12 pack     Social History   Substance and Sexual Activity  Drug Use Yes   Types: Cocaine, Marijuana    Social History   Socioeconomic History   Marital status: Single    Spouse name: Not on file   Number  of children: Not on file   Years of education: Not on file   Highest education level: Not on file  Occupational History   Not on file  Tobacco Use   Smoking status: Every Day    Packs/day: 0.50    Years: 41.00    Total pack years: 20.50    Types: Cigarettes   Smokeless tobacco: Never   Tobacco comments:    started age 70  Vaping Use   Vaping Use: Never used  Substance and Sexual Activity   Alcohol use: Yes    Alcohol/week: 12.0 standard drinks of alcohol    Types: 12 Cans of beer per week    Comment: at least a 6 pack of beer daily and often a 12 pack   Drug use: Yes    Types: Cocaine, Marijuana   Sexual activity: Not on file  Other Topics  Concern   Not on file  Social History Narrative   Single, lives in [place] with his mother.  He does not routinely exercise.  He sometimes works, helping to deliver appliances to homes.   Social Determinants of Health   Financial Resource Strain: Not on file  Food Insecurity: Not on file  Transportation Needs: Not on file  Physical Activity: Not on file  Stress: Not on file  Social Connections: Not on file   Additional Social History:    Allergies:   Allergies  Allergen Reactions   Penicillins Hives   Other Rash    chives    Labs:  Results for orders placed or performed during the hospital encounter of 08/19/22 (from the past 48 hour(s))  Comprehensive metabolic panel     Status: Abnormal   Collection Time: 08/19/22 12:53 PM  Result Value Ref Range   Sodium 136 135 - 145 mmol/L   Potassium 3.4 (L) 3.5 - 5.1 mmol/L   Chloride 106 98 - 111 mmol/L   CO2 20 (L) 22 - 32 mmol/L   Glucose, Bld 112 (H) 70 - 99 mg/dL    Comment: Glucose reference range applies only to samples taken after fasting for at least 8 hours.   BUN <5 (L) 6 - 20 mg/dL   Creatinine, Ser 0.68 0.61 - 1.24 mg/dL   Calcium 8.6 (L) 8.9 - 10.3 mg/dL   Total Protein 8.4 (H) 6.5 - 8.1 g/dL   Albumin 3.5 3.5 - 5.0 g/dL   AST 84 (H) 15 - 41 U/L   ALT 28 0 - 44 U/L   Alkaline Phosphatase 265 (H) 38 - 126 U/L   Total Bilirubin 1.1 0.3 - 1.2 mg/dL   GFR, Estimated >60 >60 mL/min    Comment: (NOTE) Calculated using the CKD-EPI Creatinine Equation (2021)    Anion gap 10 5 - 15    Comment: Performed at [facility], [address] [place], [place] [address]  cbc     Status: Abnormal   Collection Time: 08/19/22 12:53 PM  Result Value Ref Range   WBC 8.6 4.0 - 10.5 K/uL   RBC 3.83 (L) 4.22 - 5.81 MIL/uL   Hemoglobin 11.2 (L) 13.0 - 17.0 g/dL   HCT 35.6 (L) 39.0 - 52.0 %   MCV 93.0 80.0 - 100.0 fL   MCH 29.2 26.0 - 34.0 pg   MCHC 31.5 30.0 - 36.0 g/dL   RDW 17.8 (H) 11.5 - 15.5 %   Platelets 248  150 - 400 K/uL   nRBC 0.0 0.0 - 0.2 %    Comment: Performed at  [facility], [address] [place], [place] [address]  Urine Drug Screen, Qualitative     Status: Abnormal   Collection Time: 08/19/22 12:53 PM  Result Value Ref Range   Tricyclic, Ur Screen NONE DETECTED NONE DETECTED   Amphetamines, Ur Screen NONE DETECTED NONE DETECTED   MDMA (Ecstasy)Ur Screen NONE DETECTED NONE DETECTED   Cocaine Metabolite,Ur [place] POSITIVE (A) NONE DETECTED   Opiate, Ur Screen NONE DETECTED NONE DETECTED   Phencyclidine (PCP) Ur S NONE DETECTED NONE DETECTED   Cannabinoid 50 Ng, Ur  NONE DETECTED NONE DETECTED   Barbiturates, Ur Screen NONE DETECTED NONE DETECTED   Benzodiazepine, Ur Scrn NONE DETECTED NONE DETECTED   Methadone Scn, Ur NONE DETECTED NONE DETECTED    Comment: (NOTE) Tricyclics + metabolites, urine    Cutoff 1000 ng/mL Amphetamines + metabolites, urine  Cutoff 1000 ng/mL MDMA (Ecstasy), urine              Cutoff 500 ng/mL Cocaine Metabolite, urine          Cutoff 300 ng/mL Opiate + metabolites, urine        Cutoff 300 ng/mL Phencyclidine (PCP), urine         Cutoff 25 ng/mL Cannabinoid, urine                 Cutoff 50 ng/mL Barbiturates + metabolites, urine  Cutoff 200 ng/mL Benzodiazepine, urine              Cutoff 200 ng/mL Methadone, urine                   Cutoff 300 ng/mL  The urine drug screen provides only a preliminary, unconfirmed analytical test result and should not be used for non-medical purposes. Clinical consideration and professional judgment should be applied to any positive drug screen result due to possible interfering substances. A more specific alternate chemical method must be used in order to obtain a confirmed analytical result. Gas chromatography / mass spectrometry (GC/MS) is the preferred confirm atory method. Performed at [facility], [address] [place], [place] [address]   Lipase, blood     Status: None   Collection  Time: 08/19/22 12:53 PM  Result Value Ref Range   Lipase 29 11 - 51 U/L    Comment: Performed at [facility], [place], [place] [address]  Troponin I (High Sensitivity)     Status: None   Collection Time: 08/19/22 12:53 PM  Result Value Ref Range   Troponin I (High Sensitivity) 7 <18 ng/L    Comment: (NOTE) Elevated high sensitivity troponin I (hsTnI) values and significant  changes across serial measurements may suggest ACS but many other  chronic and acute conditions are known to elevate hsTnI results.  Refer to the "Links" section for chest pain algorithms and additional  guidance. Performed at [facility], [address] [place], [place] [address]   Acetaminophen level     Status: Abnormal   Collection Time: 08/19/22  1:20 PM  Result Value Ref Range   Acetaminophen (Tylenol), Serum <10 (L) 10 - 30 ug/mL    Comment: (NOTE) Therapeutic concentrations vary significantly. A range of 10-30 ug/mL  may be an effective concentration for many patients. However, some  are best treated at concentrations outside of this range. Acetaminophen concentrations >150 ug/mL at 4 hours after ingestion  and >50 ug/mL at 12 hours after ingestion are often associated with  toxic reactions.  Performed at  [facility], [address] [place], [place] [address]   Salicylate level     Status: Abnormal   Collection Time: 08/19/22  1:20 PM  Result Value Ref Range   Salicylate Lvl <4.4 (L) 7.0 - 30.0 mg/dL    Comment: Performed at [facility], [address] [place], [place] [address]  Ethanol     Status: Abnormal   Collection Time: 08/19/22  1:20 PM  Result Value Ref Range   Alcohol, Ethyl (B) 23 (H) <10 mg/dL    Comment: (NOTE) Lowest detectable limit for serum alcohol is 10 mg/dL.  For medical purposes only. Performed at [facility], [address] [place], [place] [address]     Current Facility-Administered  Medications  Medication Dose Route Frequency Provider Last Rate Last Admin   aspirin chewable tablet 81 mg  81 mg Oral Daily Blake Divine, MD       LORazepam (ATIVAN) injection 0-4 mg  0-4 mg Intravenous Q6H Blake Divine, MD       Or   LORazepam (ATIVAN) tablet 0-4 mg  0-4 mg Oral Q6H Blake Divine, MD       Derrill Memo ON 08/21/2022] LORazepam (ATIVAN) injection 0-4 mg  0-4 mg Intravenous Q12H Blake Divine, MD       Or   Derrill Memo ON 08/21/2022] LORazepam (ATIVAN) tablet 0-4 mg  0-4 mg Oral Q12H Blake Divine, MD       pantoprazole (PROTONIX) EC tablet 40 mg  40 mg Oral Daily Blake Divine, MD       rosuvastatin (CRESTOR) tablet 40 mg  40 mg Oral Daily Blake Divine, MD       thiamine (VITAMIN B1) tablet 100 mg  100 mg Oral Daily Blake Divine, MD   100 mg at 08/19/22 1536   Or   thiamine (VITAMIN B1) injection 100 mg  100 mg Intravenous Daily Blake Divine, MD       Current Outpatient Medications  Medication Sig Dispense Refill   aspirin 81 MG chewable tablet Chew 1 tablet (81 mg total) by mouth daily. 30 tablet 0   ondansetron (ZOFRAN ODT) 4 MG disintegrating tablet Take 1 tablet (4 mg total) by mouth every 8 (eight) hours as needed for nausea or vomiting. 20 tablet 0   pantoprazole (PROTONIX) 40 MG tablet Take 1 tablet (40 mg total) by mouth daily. 30 tablet 1   rosuvastatin (CRESTOR) 40 MG tablet Take 1 tablet (40 mg total) by mouth daily. 30 tablet 0    Musculoskeletal: Strength & Muscle Tone: within normal limits Gait & Station: normal Patient leans: N/A  Psychiatric Specialty Exam: Physical Exam Vitals and nursing note reviewed.  Constitutional:      Appearance: Normal appearance.  HENT:     Head: Normocephalic.     Nose: Nose normal.  Pulmonary:     Effort: Pulmonary effort is normal.  Musculoskeletal:        General: Normal range of motion.     Cervical back: Normal range of motion.  Neurological:     General: No focal deficit present.     Mental Status:  He is alert and oriented to person, place, and time.  Psychiatric:        Attention and Perception: Attention and perception normal.        Mood and Affect: Mood is anxious.        Speech: Speech normal.        Behavior: Behavior normal. Behavior is cooperative.  Thought Content: Thought content normal.        Cognition and Memory: Cognition and memory normal.        Judgment: Judgment normal.     Review of Systems  Cardiovascular:  Positive for chest pain.  Psychiatric/Behavioral:  Positive for substance abuse. The patient is nervous/anxious.   All other systems reviewed and are negative.   Blood pressure 119/82, pulse (!) 115, temperature 98.3 F (36.8 C), temperature source Oral, resp. rate 16, height '5\' 5"'$  (1.651 m), weight 81.6 kg, SpO2 98 %.Body mass index is 29.95 kg/m.  General Appearance: Casual  Eye Contact:  Good  Speech:  Normal Rate  Volume:  Normal  Mood:  Anxious and Depressed  Affect:  Congruent  Thought Process:  Coherent and Descriptions of Associations: Intact  Orientation:  Full (Time, Place, and Person)  Thought Content:  WDL and Logical  Suicidal Thoughts:  No  Homicidal Thoughts:  No  Memory:  Immediate;   Good Recent;   Good Remote;   Good  Judgement:  Fair  Insight:  Fair  Psychomotor Activity:  Decreased  Concentration:  Concentration: Fair and Attention Span: Fair  Recall:  Good  Fund of Knowledge:  Good  Language:  Good  Akathisia:  No  Handed:  Right  AIMS (if indicated):     Assets:  Leisure Time Physical Health Resilience Social Support  ADL's:  Intact  Cognition:  WNL  Sleep:        Physical Exam: Physical Exam Vitals and nursing note reviewed.  Constitutional:      Appearance: Normal appearance.  HENT:     Head: Normocephalic.     Nose: Nose normal.  Pulmonary:     Effort: Pulmonary effort is normal.  Musculoskeletal:        General: Normal range of motion.     Cervical back: Normal range of motion.  Neurological:      General: No focal deficit present.     Mental Status: He is alert and oriented to person, place, and time.  Psychiatric:        Attention and Perception: Attention and perception normal.        Mood and Affect: Mood is anxious.        Speech: Speech normal.        Behavior: Behavior normal. Behavior is cooperative.        Thought Content: Thought content normal.        Cognition and Memory: Cognition and memory normal.        Judgment: Judgment normal.    Review of Systems  Cardiovascular:  Positive for chest pain.  Psychiatric/Behavioral:  Positive for substance abuse. The patient is nervous/anxious.   All other systems reviewed and are negative.  Blood pressure 119/82, pulse (!) 115, temperature 98.3 F (36.8 C), temperature source Oral, resp. rate 16, height '5\' 5"'$  (1.651 m), weight 81.6 kg, SpO2 98 %. Body mass index is 29.95 kg/m.  Treatment Plan Summary: Alcohol use disorder: Rehab resources provided Follow up with [facility]  Disposition: No evidence of imminent risk to self or others at present.   Patient does not meet criteria for psychiatric inpatient admission.  Waylan Boga, NP 08/19/2022 5:29 PM

## 2022-08-19 NOTE — ED Notes (Signed)
Vol /discharged

## 2022-08-19 NOTE — ED Triage Notes (Addendum)
Patient c/o being suicidal and depressed X5-6 days. Psych history but reports he does not take medicines. C/o chronic back pain, abdominal pain for a few days, and migraine.   Reports he has not eaten in about a week, has just been drinking and using drugs.   Drinks on average 12 pack a day or liquor. Last used cocaine 5 days ago.

## 2022-08-19 NOTE — Discharge Instructions (Addendum)
Cleared by psychiatry for discharge home. 

## 2022-08-19 NOTE — ED Notes (Signed)
Report received from Seth Bake, Conservation officer, nature. Patient alert and oriented, warm and dry, and in no acute distress. Patient denies SI, HI, AVH and pain. Patient made aware of Q15 minute rounds and Engineer, drilling presence for their safety. Patient instructed to come to this nurse with needs or concerns.

## 2022-08-19 NOTE — ED Notes (Signed)
Pt reporting symptoms from [facility] and also states he does not want to go home and will do what he needs to do to stay. States he is not safe to go home and reports he will kill himself because he is here for help and we are doing nothing for him. Pt educated on outpatient resources

## 2022-08-19 NOTE — ED Provider Notes (Signed)
[facility] Provider Note    Event Date/Time   First MD Initiated Contact with Patient 08/19/22 1330     (approximate)   History   Chief Complaint Suicidal   HPI  Ethan Wells is a 51 y.o. male with past medical history of hypertension, CAD, diastolic CHF, alcohol abuse, and polysubstance abuse who presents to the ED complaining of suicidal ideation.  Patient reports that he has been feeling increasingly hopeless and suicidal due to issues with migraine headaches and pain in his abdomen.  He states that for the past 5 days he has been having worsening bilateral throbbing headache similar to prior migraines but more severe.  He describes associated nausea, vomiting, and photophobia.  He denies any fevers or neck stiffness.  He does endorse pain in his upper abdomen that has been present intermittently for multiple months.  He admits to regular alcohol consumption, states that he has been trying to "drink myself to death."  He states he has had 4 beers so far today, typically drinks numerous beers as well as liquor on a daily basis.  He additionally admits to cocaine abuse.  He has not noticed any blood in his stool or in his emesis.     Physical Exam   Triage Vital Signs: ED Triage Vitals [08/19/22 1243]  Enc Vitals Group     BP 119/82     Pulse Rate (!) 115     Resp 16     Temp 98.3 F (36.8 C)     Temp Source Oral     SpO2 98 %     Weight 180 lb (81.6 kg)     Height _0  (1.651 m)     Head Circumference      [place] Flow      Pain Score 10     Pain Loc      Pain Edu?      Excl. in [place]?     Most recent vital signs: Vitals:   08/19/22 1243  BP: 119/82  Pulse: (!) 115  Resp: 16  Temp: 98.3 F (36.8 C)  SpO2: 98%    Constitutional: Alert and oriented. Eyes: Conjunctivae are normal. Head: Atraumatic. Nose: No congestion/rhinnorhea. Mouth/Throat: Mucous membranes are moist.  Neck: Supple with no meningismus. Cardiovascular: Normal  rate, regular rhythm. Grossly normal heart sounds.  2+ radial pulses bilaterally. Respiratory: Normal respiratory effort.  No retractions. Lungs CTAB. Gastrointestinal: Soft and tender in the epigastrium with no rebound or guarding. No distention. Musculoskeletal: No lower extremity tenderness nor edema.  Neurologic:  Normal speech and language. No gross focal neurologic deficits are appreciated.    ED Results / Procedures / Treatments   Labs (all labs ordered are listed, but only abnormal results are displayed) Labs Reviewed  COMPREHENSIVE METABOLIC PANEL - Abnormal; Notable for the following components:      Result Value   Potassium 3.4 (*)    CO2 20 (*)    Glucose, Bld 112 (*)    BUN <5 (*)    Calcium 8.6 (*)    Total Protein 8.4 (*)    AST 84 (*)    Alkaline Phosphatase 265 (*)    All other components within normal limits  CBC - Abnormal; Notable for the following components:   RBC 3.83 (*)    Hemoglobin 11.2 (*)    HCT 35.6 (*)    RDW 17.8 (*)    All other components within normal limits  URINE DRUG SCREEN,  QUALITATIVE ([facility] ONLY) - Abnormal; Notable for the following components:   Cocaine Metabolite,Ur [place] POSITIVE (*)    All other components within normal limits  ACETAMINOPHEN LEVEL - Abnormal; Notable for the following components:   Acetaminophen (Tylenol), Serum <10 (*)    All other components within normal limits  SALICYLATE LEVEL - Abnormal; Notable for the following components:   Salicylate Lvl <8.4 (*)    All other components within normal limits  ETHANOL - Abnormal; Notable for the following components:   Alcohol, Ethyl (B) 23 (*)    All other components within normal limits  LIPASE, BLOOD  TROPONIN I (HIGH SENSITIVITY)     EKG  ED ECG REPORT I, Blake Divine, the attending physician, personally viewed and interpreted this ECG.   Date: 08/19/2022  EKG Time: 14:59  Rate: 97  Rhythm: normal sinus rhythm  Axis: Normal  Intervals: Prolonged QT  ST&T  Change: None  RADIOLOGY Chest x-ray reviewed and interpreted by me with no infiltrate, edema, or effusion.  PROCEDURES:  Critical Care performed: No  Procedures   MEDICATIONS ORDERED IN ED: Medications  LORazepam (ATIVAN) injection 0-4 mg (has no administration in time range)    Or  LORazepam (ATIVAN) tablet 0-4 mg (has no administration in time range)  LORazepam (ATIVAN) injection 0-4 mg (has no administration in time range)    Or  LORazepam (ATIVAN) tablet 0-4 mg (has no administration in time range)  thiamine (VITAMIN B1) tablet 100 mg (100 mg Oral Given 08/19/22 1536)    Or  thiamine (VITAMIN B1) injection 100 mg ( Intravenous See Alternative 08/19/22 1536)  aspirin chewable tablet 81 mg (has no administration in time range)  pantoprazole (PROTONIX) EC tablet 40 mg (has no administration in time range)  rosuvastatin (CRESTOR) tablet 40 mg (has no administration in time range)  prochlorperazine (COMPAZINE) injection 10 mg (10 mg Intravenous Given 08/19/22 1536)  diphenhydrAMINE (BENADRYL) injection 25 mg (25 mg Intravenous Given 08/19/22 1537)  alum & mag hydroxide-simeth (MAALOX/MYLANTA) 200-200-20 MG/5ML suspension 30 mL (30 mLs Oral Given 08/19/22 1536)    And  lidocaine (XYLOCAINE) 2 % viscous mouth solution 15 mL (15 mLs Oral Given 08/19/22 1536)     IMPRESSION / MDM / ASSESSMENT AND PLAN / ED COURSE  I reviewed the triage vital signs and the nursing notes.                              51 y.o. male with past medical history of hypertension, CAD, diastolic CHF, alcohol abuse, and polysubstance abuse who presents to the ED with increasing thoughts of suicide and wanting to "drink myself to death" along with worsening migraine headache and acute on chronic upper abdominal pain.  Patient's presentation is most consistent with acute presentation with potential threat to life or bodily function.  Differential diagnosis includes, but is not limited to, migraine headache, tension  headache, meningitis, SAH, pancreatitis, hepatitis, biliary colic, cholecystitis, alcoholic gastritis, electrolyte abnormality.  Patient nontoxic-appearing and in no acute distress, vital signs are markable for tachycardia but otherwise reassuring.  He describes current headache as similar to prior migraines but more severe and unrelenting.  No focal neurologic deficits on exam and symptoms do sound consistent with recurrent migraine, plan to treat with IV Compazine and Benadryl but do not feel CT imaging of his head is warranted.  He does have some tenderness in his epigastrium, however this appears to be a chronic issue and  he had endoscopy performed recently showing alcoholic duodenitis.  Given his recent binge drinking, suspect recurrent alcoholic duodenitis/gastritis, which we will treat with GI cocktail.  Labs are reassuring with similar mild elevation in AST and alk phos, likely secondary to alcohol abuse.  No significant electrolyte abnormality or AKI noted, no significant anemia or leukocytosis, troponin within normal limits.  Do not feel CT imaging of his abdomen is indicated at this time and patient may be medically cleared for psychiatric disposition.  The patient has been placed in psychiatric observation due to the need to provide a safe environment for the patient while obtaining psychiatric consultation and evaluation, as well as ongoing medical and medication management to treat the patient's condition.  The patient has not been placed under full IVC at this time.      FINAL CLINICAL IMPRESSION(S) / ED DIAGNOSES   Final diagnoses:  Suicidal ideation  Migraine without status migrainosus, not intractable, unspecified migraine type  Chronic abdominal pain  Chronic alcoholic gastritis without hemorrhage     Rx / DC Orders   ED Discharge Orders     None        Note:  This document was prepared using Dragon voice recognition software and may include unintentional dictation  errors.   Blake Divine, MD 08/19/22 [phone]

## 2022-08-19 NOTE — ED Provider Notes (Signed)
Patient cleared by psychiatry for discharge home.  Patient still complaining of headache so given the positive cocaine test I did order CT head just to make sure no evidence of intracranial hemorrhage that would be from the cocaine use.  This was negative.  Patient's abdominal pain is upper abdominal and he is got a history of esophageal gastritis I suspect that this is his abdominal pain.  He is got no lower abdominal tenderness.  Patient is still slightly tachycardic but had EKG without evidence of arrhythmia.  I suspect this could be from the cocaine abuse versus from the EtOH use/withdrawal  Patient is cleared for discharge and upon discharge patient was reporting to the nurse that he would do whatever it takes to stay and reported SI.  See his note for more details   After getting the CT I did update him on the results and explained our policy that TTS typically provides resources for them to call to make an appointment.  I explained that his CT scan looked good but there is no indication to keep him in the emergency room at this time.  Patient is already been seen by psychiatry and cleared for discharge and I feel like him saying he was going to have SI was malingering.  When I tell him that this is our process he actually took this information very well with me and did not state any SI and expressed understanding. Pt has a RV that he reprots he stays at and has a list of resouces for detox.    Vanessa McQueeney, MD 08/19/22 2202

## 2022-09-04 ENCOUNTER — Telehealth: Payer: Self-pay | Admitting: *Deleted

## 2022-09-04 NOTE — Patient Outreach (Signed)
  Care Coordination   09/04/2022 Name: Ethan Wells MRN: [id number] DOB: [birth year]   Care Coordination Outreach Attempts:  An unsuccessful telephone outreach was attempted today to offer the patient information about available care coordination services as a benefit of their health plan.   Follow Up Plan:  Additional outreach attempts will be made to offer the patient care coordination information and services.   Encounter Outcome:  No Answer  Care Coordination Interventions Activated:  No   Care Coordination Interventions:  No, not indicated    Jacqlyn Larsen [facility], [place] RN Care Coordinator [phone]

## 2022-09-05 ENCOUNTER — Telehealth: Payer: Self-pay | Admitting: *Deleted

## 2022-09-05 NOTE — Patient Outreach (Signed)
  Care Coordination   09/05/2022 Name: Ethan Wells MRN: [id number] DOB: [birth year]   Care Coordination Outreach Attempts:  A second unsuccessful outreach was attempted today to offer the patient with information about available care coordination services as a benefit of their health plan.     Follow Up Plan:  Additional outreach attempts will be made to offer the patient care coordination information and services.   Encounter Outcome:  No Answer  Care Coordination Interventions Activated:  No   Care Coordination Interventions:  No, not indicated    Jacqlyn Larsen [facility], [place] RN Care Coordinator [phone]

## 2022-09-06 ENCOUNTER — Telehealth: Payer: Self-pay | Admitting: *Deleted

## 2022-09-06 NOTE — Patient Outreach (Signed)
  Care Coordination   09/06/2022 Name: Ethan Wells MRN: [id number] DOB: 20-Mar-1971   Care Coordination Outreach Attempts:  A third unsuccessful outreach was attempted today to offer the patient with information about available care coordination services as a benefit of their health plan.   Follow Up Plan:  No further outreach attempts will be made at this time. We have been unable to contact the patient to offer or enroll patient in care coordination services  Encounter Outcome:  No Answer  Care Coordination Interventions Activated:  No   Care Coordination Interventions:  No, not indicated    Jacqlyn Larsen [facility], BSN [facility] RN Care Coordinator [phone]

## 2022-09-12 ENCOUNTER — Ambulatory Visit: Payer: Medicare HMO | Admitting: Adult Health

## 2022-09-23 ENCOUNTER — Encounter: Payer: Self-pay | Admitting: Adult Health

## 2022-09-23 ENCOUNTER — Ambulatory Visit (INDEPENDENT_AMBULATORY_CARE_PROVIDER_SITE_OTHER): Payer: Medicare HMO | Admitting: Adult Health

## 2022-09-23 VITALS — BP 136/80 | HR 91 | Temp 98.9°F | Resp 18 | Ht 65.0 in | Wt 187.8 lb

## 2022-09-23 DIAGNOSIS — J4520 Mild intermittent asthma, uncomplicated: Secondary | ICD-10-CM

## 2022-09-23 DIAGNOSIS — R6889 Other general symptoms and signs: Secondary | ICD-10-CM | POA: Diagnosis not present

## 2022-09-23 DIAGNOSIS — I10 Essential (primary) hypertension: Secondary | ICD-10-CM | POA: Diagnosis not present

## 2022-09-23 DIAGNOSIS — F1911 Other psychoactive substance abuse, in remission: Secondary | ICD-10-CM

## 2022-09-23 DIAGNOSIS — I252 Old myocardial infarction: Secondary | ICD-10-CM | POA: Diagnosis not present

## 2022-09-23 DIAGNOSIS — G8929 Other chronic pain: Secondary | ICD-10-CM

## 2022-09-23 DIAGNOSIS — E669 Obesity, unspecified: Secondary | ICD-10-CM | POA: Diagnosis not present

## 2022-09-23 DIAGNOSIS — Z7689 Persons encountering health services in other specified circumstances: Secondary | ICD-10-CM | POA: Diagnosis not present

## 2022-09-23 DIAGNOSIS — E66811 Obesity, class 1: Secondary | ICD-10-CM

## 2022-09-23 DIAGNOSIS — F1910 Other psychoactive substance abuse, uncomplicated: Secondary | ICD-10-CM | POA: Diagnosis not present

## 2022-09-23 DIAGNOSIS — Z8719 Personal history of other diseases of the digestive system: Secondary | ICD-10-CM

## 2022-09-23 DIAGNOSIS — R519 Headache, unspecified: Secondary | ICD-10-CM

## 2022-09-23 DIAGNOSIS — E782 Mixed hyperlipidemia: Secondary | ICD-10-CM | POA: Diagnosis not present

## 2022-09-23 DIAGNOSIS — Z6831 Body mass index (BMI) 31.0-31.9, adult: Secondary | ICD-10-CM

## 2022-09-23 DIAGNOSIS — R2681 Unsteadiness on feet: Secondary | ICD-10-CM

## 2022-09-23 DIAGNOSIS — G629 Polyneuropathy, unspecified: Secondary | ICD-10-CM | POA: Diagnosis not present

## 2022-09-23 DIAGNOSIS — J4489 Other specified chronic obstructive pulmonary disease: Secondary | ICD-10-CM | POA: Diagnosis not present

## 2022-09-23 MED ORDER — ROSUVASTATIN CALCIUM 40 MG PO TABS: 40.0000 mg | ORAL_TABLET | Freq: Every day | ORAL | 4 refills | Status: DC

## 2022-09-23 MED ORDER — SUMATRIPTAN SUCCINATE 50 MG PO TABS: 50.0000 mg | ORAL_TABLET | Freq: Every day | ORAL | 0 refills | Status: DC | PRN

## 2022-09-23 MED ORDER — PANTOPRAZOLE SODIUM 40 MG PO TBEC: 40.0000 mg | DELAYED_RELEASE_TABLET | Freq: Every day | ORAL | 4 refills | Status: DC

## 2022-09-23 MED ORDER — ALBUTEROL SULFATE (2.5 MG/3ML) 0.083% IN NEBU: 2.5000 mg | INHALATION_SOLUTION | Freq: Four times a day (QID) | RESPIRATORY_TRACT | 12 refills | Status: DC | PRN

## 2022-09-23 MED ORDER — GABAPENTIN 300 MG PO CAPS: 300.0000 mg | ORAL_CAPSULE | Freq: Three times a day (TID) | ORAL | 4 refills | Status: DC

## 2022-09-23 MED ORDER — LISINOPRIL 5 MG PO TABS: 5.0000 mg | ORAL_TABLET | Freq: Every day | ORAL | 4 refills | Status: DC
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
## 2022-09-23 NOTE — Addendum Note (Signed)
Addended by: Durenda Age C on: 09/23/2022 01:27 PM   Modules accepted: Orders

## 2022-09-23 NOTE — Patient Instructions (Signed)
Preventive Care 40-50 Years Old, Male ?Preventive care refers to lifestyle choices and visits with your health care provider that can promote health and wellness. Preventive care visits are also called wellness exams. ?What can I expect for my preventive care visit? ?Counseling ?During your preventive care visit, your health care provider may ask about your: ?Medical history, including: ?Past medical problems. ?Family medical history. ?Current health, including: ?Emotional well-being. ?Home life and relationship well-being. ?Sexual activity. ?Lifestyle, including: ?Alcohol, nicotine or tobacco, and drug use. ?Access to firearms. ?Diet, exercise, and sleep habits. ?Safety issues such as seatbelt and bike helmet use. ?Sunscreen use. ?Work and work environment. ?Physical exam ?Your health care provider will check your: ?Height and weight. These may be used to calculate your BMI (body mass index). BMI is a measurement that tells if you are at a healthy weight. ?Waist circumference. This measures the distance around your waistline. This measurement also tells if you are at a healthy weight and may help predict your risk of certain diseases, such as type 2 diabetes and high blood pressure. ?Heart rate and blood pressure. ?Body temperature. ?Skin for abnormal spots. ?What immunizations do I need? ? ?Vaccines are usually given at various ages, according to a schedule. Your health care provider will recommend vaccines for you based on your age, medical history, and lifestyle or other factors, such as travel or where you work. ?What tests do I need? ?Screening ?Your health care provider may recommend screening tests for certain conditions. This may include: ?Lipid and cholesterol levels. ?Diabetes screening. This is done by checking your blood sugar (glucose) after you have not eaten for a while (fasting). ?Hepatitis B test. ?Hepatitis C test. ?HIV (human immunodeficiency virus) test. ?STI (sexually transmitted infection)  testing, if you are at risk. ?Lung cancer screening. ?Prostate cancer screening. ?Colorectal cancer screening. ?Talk with your health care provider about your test results, treatment options, and if necessary, the need for more tests. ?Follow these instructions at home: ?Eating and drinking ? ?Eat a diet that includes fresh fruits and vegetables, whole grains, lean protein, and low-fat dairy products. ?Take vitamin and mineral supplements as recommended by your health care provider. ?Do not drink alcohol if your health care provider tells you not to drink. ?If you drink alcohol: ?Limit how much you have to 50-2 drinks a day. ?Know how much alcohol is in your drink. In the [place] one drink equals one 12 oz bottle of beer (355 mL), one 5 oz glass of wine (148 mL), or one 1? oz glass of hard liquor (44 mL). ?Lifestyle ?Brush your teeth every morning and night with fluoride toothpaste. Floss one time each day. ?Exercise for at least 30 minutes 5 or more days each week. ?Do not use any products that contain nicotine or tobacco. These products include cigarettes, chewing tobacco, and vaping devices, such as e-cigarettes. If you need help quitting, ask your health care provider. ?Do not use drugs. ?If you are sexually active, practice safe sex. Use a condom or other form of protection to prevent STIs. ?Take aspirin only as told by your health care provider. Make sure that you understand how much to take and what form to take. Work with your health care provider to find out whether it is safe and beneficial for you to take aspirin daily. ?Find healthy ways to manage stress, such as: ?Meditation, yoga, or listening to music. ?Journaling. ?Talking to a trusted person. ?Spending time with friends and family. ?Minimize exposure to UV radiation to reduce   your risk of skin cancer. ?Safety ?Always wear your seat belt while driving or riding in a vehicle. ?Do not drive: ?If you have been drinking alcohol. Do not ride with someone who  has been drinking. ?When you are tired or distracted. ?While texting. ?If you have been using any mind-altering substances or drugs. ?Wear a helmet and other protective equipment during sports activities. ?If you have firearms in your house, make sure you follow all gun safety procedures. ?What's next? ?Go to your health care provider once a year for an annual wellness visit. ?Ask your health care provider how often you should have your eyes and teeth checked. ?Stay up to date on all vaccines. ?This information is not intended to replace advice given to you by your health care provider. Make sure you discuss any questions you have with your health care provider. ?Document Revised: 05/30/2021 Document Reviewed: 05/30/2021 ?Elsevier Patient Education ? 2023 [employer]vier Inc. ? ?

## 2022-09-24 ENCOUNTER — Telehealth: Payer: Self-pay | Admitting: *Deleted

## 2022-09-24 LAB — CBC WITH DIFFERENTIAL/PLATELET
Absolute Monocytes: 917 cells/uL (ref 200–950)
Basophils Absolute: 62 cells/uL (ref 0–200)
Basophils Relative: 0.7 %
Eosinophils Absolute: 116 cells/uL (ref 15–500)
Eosinophils Relative: 1.3 %
HCT: 29.5 % — ABNORMAL LOW (ref 38.5–50.0)
Hemoglobin: 10.3 g/dL — ABNORMAL LOW (ref 13.2–17.1)
Lymphs Abs: 1353 cells/uL (ref 850–3900)
MCH: 31.3 pg (ref 27.0–33.0)
MCHC: 34.9 g/dL (ref 32.0–36.0)
MCV: 89.7 fL (ref 80.0–100.0)
MPV: 9.7 fL (ref 7.5–12.5)
Monocytes Relative: 10.3 %
Neutro Abs: 6453 cells/uL (ref 1500–7800)
Neutrophils Relative %: 72.5 %
Platelets: 225 10*3/uL (ref 140–400)
RBC: 3.29 10*6/uL — ABNORMAL LOW (ref 4.20–5.80)
RDW: 16.6 % — ABNORMAL HIGH (ref 11.0–15.0)
Total Lymphocyte: 15.2 %
WBC: 8.9 10*3/uL (ref 3.8–10.8)

## 2022-09-24 LAB — LIPID PANEL
Cholesterol: 148 mg/dL (ref <200)
HDL: 13 mg/dL — ABNORMAL LOW (ref >=40)
LDL Cholesterol (Calc): 110 mg/dL (calc) — ABNORMAL HIGH
Non-HDL Cholesterol (Calc): 135 mg/dL (calc) — ABNORMAL HIGH (ref <130)
Total CHOL/HDL Ratio: 11.4 (calc) — ABNORMAL HIGH (ref <5.0)
Triglycerides: 131 mg/dL (ref <150)

## 2022-09-24 LAB — COMPLETE METABOLIC PANEL WITH GFR
AG Ratio: 0.8 (calc) — ABNORMAL LOW (ref 1.0–2.5)
ALT: 28 U/L (ref 9–46)
AST: 78 U/L — ABNORMAL HIGH (ref 10–35)
Albumin: 3.4 g/dL — ABNORMAL LOW (ref 3.6–5.1)
Alkaline phosphatase (APISO): 265 U/L — ABNORMAL HIGH (ref 35–144)
BUN/Creatinine Ratio: 8 (calc) (ref 6–22)
BUN: 4 mg/dL — ABNORMAL LOW (ref 7–25)
CO2: 23 mmol/L (ref 20–32)
Calcium: 8.3 mg/dL — ABNORMAL LOW (ref 8.6–10.3)
Chloride: 102 mmol/L (ref 98–110)
Creat: 0.49 mg/dL — ABNORMAL LOW (ref 0.70–1.30)
Globulin: 4.4 g/dL (calc) — ABNORMAL HIGH (ref 1.9–3.7)
Glucose, Bld: 103 mg/dL (ref 65–139)
Potassium: 3.2 mmol/L — ABNORMAL LOW (ref 3.5–5.3)
Sodium: 133 mmol/L — ABNORMAL LOW (ref 135–146)
Total Bilirubin: 3.2 mg/dL — ABNORMAL HIGH (ref 0.2–1.2)
Total Protein: 7.8 g/dL (ref 6.1–8.1)
eGFR: 125 mL/min/{1.73_m2} (ref >=60)

## 2022-09-24 LAB — HEPATITIS B SURFACE ANTIBODY,QUALITATIVE: Hep B S Ab: NONREACTIVE

## 2022-09-24 LAB — HEPATITIS C ANTIBODY: Hepatitis C Ab: NONREACTIVE

## 2022-09-24 LAB — HIV ANTIBODY (ROUTINE TESTING W REFLEX): HIV 1&2 Ab, 4th Generation: NONREACTIVE

## 2022-09-24 NOTE — Telephone Encounter (Signed)
Patient called he took the Imitrex you gave him for his migraines. His head started hurting worse, his shoulder, chest ,stomach, and the back of neck also hurt. Patient has been up since 5:00 am.. [place], please advise.

## 2022-09-28 ENCOUNTER — Other Ambulatory Visit: Payer: Self-pay | Admitting: Adult Health

## 2022-09-30 ENCOUNTER — Encounter (HOSPITAL_COMMUNITY): Payer: Self-pay | Admitting: Emergency Medicine

## 2022-09-30 ENCOUNTER — Inpatient Hospital Stay (HOSPITAL_COMMUNITY)
Admission: EM | Admit: 2022-09-30 | Discharge: 2022-10-03 | DRG: 394 | Disposition: A | Discharge status: Home / Self Care | Payer: Medicare HMO | Attending: Internal Medicine | Admitting: Internal Medicine

## 2022-09-30 ENCOUNTER — Emergency Department (HOSPITAL_COMMUNITY): Payer: Medicare HMO

## 2022-09-30 ENCOUNTER — Other Ambulatory Visit: Payer: Self-pay

## 2022-09-30 DIAGNOSIS — F32A Depression, unspecified: Secondary | ICD-10-CM | POA: Diagnosis present

## 2022-09-30 DIAGNOSIS — Z880 Allergy status to penicillin: Secondary | ICD-10-CM

## 2022-09-30 DIAGNOSIS — I255 Ischemic cardiomyopathy: Secondary | ICD-10-CM | POA: Diagnosis present

## 2022-09-30 DIAGNOSIS — K625 Hemorrhage of anus and rectum: Secondary | ICD-10-CM | POA: Diagnosis present

## 2022-09-30 DIAGNOSIS — Z955 Presence of coronary angioplasty implant and graft: Secondary | ICD-10-CM

## 2022-09-30 DIAGNOSIS — I774 Celiac artery compression syndrome: Secondary | ICD-10-CM | POA: Diagnosis not present

## 2022-09-30 DIAGNOSIS — I2510 Atherosclerotic heart disease of native coronary artery without angina pectoris: Secondary | ICD-10-CM | POA: Diagnosis not present

## 2022-09-30 DIAGNOSIS — Z8249 Family history of ischemic heart disease and other diseases of the circulatory system: Secondary | ICD-10-CM

## 2022-09-30 DIAGNOSIS — K2270 Barrett's esophagus without dysplasia: Secondary | ICD-10-CM | POA: Diagnosis present

## 2022-09-30 DIAGNOSIS — R7989 Other specified abnormal findings of blood chemistry: Secondary | ICD-10-CM | POA: Diagnosis not present

## 2022-09-30 DIAGNOSIS — A09 Infectious gastroenteritis and colitis, unspecified: Secondary | ICD-10-CM | POA: Diagnosis present

## 2022-09-30 DIAGNOSIS — R778 Other specified abnormalities of plasma proteins: Secondary | ICD-10-CM | POA: Diagnosis not present

## 2022-09-30 DIAGNOSIS — J984 Other disorders of lung: Secondary | ICD-10-CM | POA: Diagnosis not present

## 2022-09-30 DIAGNOSIS — I25119 Atherosclerotic heart disease of native coronary artery with unspecified angina pectoris: Secondary | ICD-10-CM | POA: Diagnosis present

## 2022-09-30 DIAGNOSIS — K6389 Other specified diseases of intestine: Secondary | ICD-10-CM | POA: Diagnosis not present

## 2022-09-30 DIAGNOSIS — I110 Hypertensive heart disease with heart failure: Secondary | ICD-10-CM | POA: Diagnosis present

## 2022-09-30 DIAGNOSIS — K7460 Unspecified cirrhosis of liver: Secondary | ICD-10-CM | POA: Diagnosis present

## 2022-09-30 DIAGNOSIS — Z8674 Personal history of sudden cardiac arrest: Secondary | ICD-10-CM

## 2022-09-30 DIAGNOSIS — G43909 Migraine, unspecified, not intractable, without status migrainosus: Secondary | ICD-10-CM | POA: Diagnosis present

## 2022-09-30 DIAGNOSIS — K551 Chronic vascular disorders of intestine: Secondary | ICD-10-CM | POA: Diagnosis present

## 2022-09-30 DIAGNOSIS — R9431 Abnormal electrocardiogram [ECG] [EKG]: Secondary | ICD-10-CM | POA: Diagnosis present

## 2022-09-30 DIAGNOSIS — K2980 Duodenitis without bleeding: Secondary | ICD-10-CM | POA: Diagnosis present

## 2022-09-30 DIAGNOSIS — E785 Hyperlipidemia, unspecified: Secondary | ICD-10-CM | POA: Diagnosis present

## 2022-09-30 DIAGNOSIS — F1090 Alcohol use, unspecified, uncomplicated: Secondary | ICD-10-CM | POA: Diagnosis not present

## 2022-09-30 DIAGNOSIS — Z1152 Encounter for screening for COVID-19: Secondary | ICD-10-CM

## 2022-09-30 DIAGNOSIS — Z79899 Other long term (current) drug therapy: Secondary | ICD-10-CM | POA: Diagnosis not present

## 2022-09-30 DIAGNOSIS — D62 Acute posthemorrhagic anemia: Secondary | ICD-10-CM | POA: Diagnosis not present

## 2022-09-30 DIAGNOSIS — K700 Alcoholic fatty liver: Secondary | ICD-10-CM | POA: Diagnosis present

## 2022-09-30 DIAGNOSIS — K641 Second degree hemorrhoids: Secondary | ICD-10-CM | POA: Diagnosis present

## 2022-09-30 DIAGNOSIS — K921 Melena: Secondary | ICD-10-CM

## 2022-09-30 DIAGNOSIS — K766 Portal hypertension: Secondary | ICD-10-CM | POA: Diagnosis present

## 2022-09-30 DIAGNOSIS — R519 Headache, unspecified: Secondary | ICD-10-CM

## 2022-09-30 DIAGNOSIS — E871 Hypo-osmolality and hyponatremia: Secondary | ICD-10-CM | POA: Diagnosis not present

## 2022-09-30 DIAGNOSIS — E876 Hypokalemia: Secondary | ICD-10-CM | POA: Diagnosis present

## 2022-09-30 DIAGNOSIS — Z811 Family history of alcohol abuse and dependence: Secondary | ICD-10-CM

## 2022-09-30 DIAGNOSIS — I25118 Atherosclerotic heart disease of native coronary artery with other forms of angina pectoris: Secondary | ICD-10-CM | POA: Diagnosis not present

## 2022-09-30 DIAGNOSIS — K922 Gastrointestinal hemorrhage, unspecified: Secondary | ICD-10-CM | POA: Diagnosis present

## 2022-09-30 DIAGNOSIS — I5022 Chronic systolic (congestive) heart failure: Secondary | ICD-10-CM | POA: Diagnosis not present

## 2022-09-30 DIAGNOSIS — R0602 Shortness of breath: Secondary | ICD-10-CM | POA: Diagnosis not present

## 2022-09-30 DIAGNOSIS — Z6831 Body mass index (BMI) 31.0-31.9, adult: Secondary | ICD-10-CM

## 2022-09-30 DIAGNOSIS — I5042 Chronic combined systolic (congestive) and diastolic (congestive) heart failure: Secondary | ICD-10-CM | POA: Diagnosis present

## 2022-09-30 DIAGNOSIS — K529 Noninfective gastroenteritis and colitis, unspecified: Secondary | ICD-10-CM | POA: Diagnosis present

## 2022-09-30 DIAGNOSIS — G8929 Other chronic pain: Secondary | ICD-10-CM | POA: Diagnosis present

## 2022-09-30 DIAGNOSIS — K649 Unspecified hemorrhoids: Secondary | ICD-10-CM | POA: Diagnosis not present

## 2022-09-30 DIAGNOSIS — E119 Type 2 diabetes mellitus without complications: Secondary | ICD-10-CM | POA: Diagnosis present

## 2022-09-30 DIAGNOSIS — I214 Non-ST elevation (NSTEMI) myocardial infarction: Secondary | ICD-10-CM

## 2022-09-30 DIAGNOSIS — F419 Anxiety disorder, unspecified: Secondary | ICD-10-CM | POA: Diagnosis present

## 2022-09-30 DIAGNOSIS — I2489 Other forms of acute ischemic heart disease: Secondary | ICD-10-CM

## 2022-09-30 DIAGNOSIS — F1010 Alcohol abuse, uncomplicated: Secondary | ICD-10-CM | POA: Diagnosis present

## 2022-09-30 DIAGNOSIS — E669 Obesity, unspecified: Secondary | ICD-10-CM | POA: Diagnosis present

## 2022-09-30 DIAGNOSIS — Z602 Problems related to living alone: Secondary | ICD-10-CM | POA: Diagnosis present

## 2022-09-30 DIAGNOSIS — F418 Other specified anxiety disorders: Secondary | ICD-10-CM | POA: Diagnosis not present

## 2022-09-30 DIAGNOSIS — Z634 Disappearance and death of family member: Secondary | ICD-10-CM

## 2022-09-30 DIAGNOSIS — F17210 Nicotine dependence, cigarettes, uncomplicated: Secondary | ICD-10-CM | POA: Diagnosis present

## 2022-09-30 DIAGNOSIS — D649 Anemia, unspecified: Secondary | ICD-10-CM | POA: Diagnosis not present

## 2022-09-30 DIAGNOSIS — F17200 Nicotine dependence, unspecified, uncomplicated: Secondary | ICD-10-CM | POA: Diagnosis not present

## 2022-09-30 DIAGNOSIS — K640 First degree hemorrhoids: Principal | ICD-10-CM | POA: Diagnosis present

## 2022-09-30 DIAGNOSIS — K219 Gastro-esophageal reflux disease without esophagitis: Secondary | ICD-10-CM | POA: Diagnosis present

## 2022-09-30 DIAGNOSIS — R933 Abnormal findings on diagnostic imaging of other parts of digestive tract: Secondary | ICD-10-CM

## 2022-09-30 DIAGNOSIS — F1210 Cannabis abuse, uncomplicated: Secondary | ICD-10-CM | POA: Diagnosis present

## 2022-09-30 DIAGNOSIS — R0789 Other chest pain: Secondary | ICD-10-CM | POA: Diagnosis present

## 2022-09-30 DIAGNOSIS — E538 Deficiency of other specified B group vitamins: Secondary | ICD-10-CM | POA: Diagnosis present

## 2022-09-30 DIAGNOSIS — Z720 Tobacco use: Secondary | ICD-10-CM | POA: Diagnosis present

## 2022-09-30 DIAGNOSIS — I509 Heart failure, unspecified: Secondary | ICD-10-CM | POA: Diagnosis not present

## 2022-09-30 DIAGNOSIS — R079 Chest pain, unspecified: Secondary | ICD-10-CM | POA: Diagnosis not present

## 2022-09-30 DIAGNOSIS — I252 Old myocardial infarction: Secondary | ICD-10-CM

## 2022-09-30 DIAGNOSIS — I4581 Long QT syndrome: Secondary | ICD-10-CM | POA: Diagnosis present

## 2022-09-30 DIAGNOSIS — F1410 Cocaine abuse, uncomplicated: Secondary | ICD-10-CM | POA: Diagnosis present

## 2022-09-30 DIAGNOSIS — K068 Other specified disorders of gingiva and edentulous alveolar ridge: Secondary | ICD-10-CM | POA: Diagnosis present

## 2022-09-30 DIAGNOSIS — I7789 Other specified disorders of arteries and arterioles: Secondary | ICD-10-CM | POA: Diagnosis not present

## 2022-09-30 DIAGNOSIS — Z7982 Long term (current) use of aspirin: Secondary | ICD-10-CM

## 2022-09-30 LAB — MAGNESIUM: Magnesium: 1.6 mg/dL — ABNORMAL LOW (ref 1.7–2.4)

## 2022-09-30 LAB — URINALYSIS, ROUTINE W REFLEX MICROSCOPIC
Bilirubin Urine: NEGATIVE
Glucose, UA: NEGATIVE mg/dL
Hgb urine dipstick: NEGATIVE
Ketones, ur: NEGATIVE mg/dL
Leukocytes,Ua: NEGATIVE
Nitrite: NEGATIVE
Protein, ur: NEGATIVE mg/dL
Specific Gravity, Urine: 1.011 (ref 1.005–1.030)
pH: 9 — ABNORMAL HIGH (ref 5.0–8.0)

## 2022-09-30 LAB — PHOSPHORUS: Phosphorus: 2.1 mg/dL — ABNORMAL LOW (ref 2.5–4.6)

## 2022-09-30 LAB — CBC WITH DIFFERENTIAL/PLATELET
Abs Immature Granulocytes: 0.02 10*3/uL (ref 0.00–0.07)
Basophils Absolute: 0.1 10*3/uL (ref 0.0–0.1)
Basophils Relative: 1 %
Eosinophils Absolute: 0.1 10*3/uL (ref 0.0–0.5)
Eosinophils Relative: 1 %
HCT: 24.7 % — ABNORMAL LOW (ref 39.0–52.0)
Hemoglobin: 8.2 g/dL — ABNORMAL LOW (ref 13.0–17.0)
Immature Granulocytes: 0 %
Lymphocytes Relative: 20 %
Lymphs Abs: 1.4 10*3/uL (ref 0.7–4.0)
MCH: 31.1 pg (ref 26.0–34.0)
MCHC: 33.2 g/dL (ref 30.0–36.0)
MCV: 93.6 fL (ref 80.0–100.0)
Monocytes Absolute: 0.5 10*3/uL (ref 0.1–1.0)
Monocytes Relative: 8 %
Neutro Abs: 4.8 10*3/uL (ref 1.7–7.7)
Neutrophils Relative %: 70 %
Platelets: 175 10*3/uL (ref 150–400)
RBC: 2.64 MIL/uL — ABNORMAL LOW (ref 4.22–5.81)
RDW: 19.7 % — ABNORMAL HIGH (ref 11.5–15.5)
WBC: 6.8 10*3/uL (ref 4.0–10.5)
nRBC: 0 % (ref 0.0–0.2)

## 2022-09-30 LAB — CBC
HCT: 30 % — ABNORMAL LOW (ref 39.0–52.0)
Hemoglobin: 9.8 g/dL — ABNORMAL LOW (ref 13.0–17.0)
MCH: 30.8 pg (ref 26.0–34.0)
MCHC: 32.7 g/dL (ref 30.0–36.0)
MCV: 94.3 fL (ref 80.0–100.0)
Platelets: 221 10*3/uL (ref 150–400)
RBC: 3.18 MIL/uL — ABNORMAL LOW (ref 4.22–5.81)
RDW: 19.5 % — ABNORMAL HIGH (ref 11.5–15.5)
WBC: 7.4 10*3/uL (ref 4.0–10.5)
nRBC: 0 % (ref 0.0–0.2)

## 2022-09-30 LAB — URINALYSIS, COMPLETE (UACMP) WITH MICROSCOPIC
Bacteria, UA: NONE SEEN
Bilirubin Urine: NEGATIVE
Glucose, UA: NEGATIVE mg/dL
Hgb urine dipstick: NEGATIVE
Ketones, ur: NEGATIVE mg/dL
Leukocytes,Ua: NEGATIVE
Nitrite: NEGATIVE
Protein, ur: NEGATIVE mg/dL
Specific Gravity, Urine: 1.012 (ref 1.005–1.030)
pH: 9 — ABNORMAL HIGH (ref 5.0–8.0)

## 2022-09-30 LAB — RAPID URINE DRUG SCREEN, HOSP PERFORMED
Amphetamines: NOT DETECTED
Barbiturates: NOT DETECTED
Benzodiazepines: NOT DETECTED
Cocaine: POSITIVE — AB
Opiates: NOT DETECTED
Tetrahydrocannabinol: NOT DETECTED

## 2022-09-30 LAB — COMPREHENSIVE METABOLIC PANEL
ALT: 34 U/L (ref 0–44)
AST: 103 U/L — ABNORMAL HIGH (ref 15–41)
Albumin: 2.8 g/dL — ABNORMAL LOW (ref 3.5–5.0)
Alkaline Phosphatase: 259 U/L — ABNORMAL HIGH (ref 38–126)
Anion gap: 10 (ref 5–15)
BUN: <5 mg/dL — ABNORMAL LOW (ref 6–20)
CO2: 24 mmol/L (ref 22–32)
Calcium: 8.1 mg/dL — ABNORMAL LOW (ref 8.9–10.3)
Chloride: 103 mmol/L (ref 98–111)
Creatinine, Ser: 0.62 mg/dL (ref 0.61–1.24)
GFR, Estimated: >60 mL/min (ref >60)
Glucose, Bld: 128 mg/dL — ABNORMAL HIGH (ref 70–99)
Potassium: 3.3 mmol/L — ABNORMAL LOW (ref 3.5–5.1)
Sodium: 137 mmol/L (ref 135–145)
Total Bilirubin: 1.4 mg/dL — ABNORMAL HIGH (ref 0.3–1.2)
Total Protein: 7.7 g/dL (ref 6.5–8.1)

## 2022-09-30 LAB — RESP PANEL BY RT-PCR (FLU A&B, COVID) ARPGX2
Influenza A by PCR: NEGATIVE
Influenza B by PCR: NEGATIVE
SARS Coronavirus 2 by RT PCR: NEGATIVE

## 2022-09-30 LAB — RETICULOCYTES
Immature Retic Fract: 20 % — ABNORMAL HIGH (ref 2.3–15.9)
RBC.: 2.68 MIL/uL — ABNORMAL LOW (ref 4.22–5.81)
Retic Count, Absolute: 34.8 10*3/uL (ref 19.0–186.0)
Retic Ct Pct: 1.3 % (ref 0.4–3.1)

## 2022-09-30 LAB — IRON AND TIBC
Iron: 66 ug/dL (ref 45–182)
Saturation Ratios: 19 % (ref 17.9–39.5)
TIBC: 342 ug/dL (ref 250–450)
UIBC: 276 ug/dL

## 2022-09-30 LAB — HEPATIC FUNCTION PANEL
ALT: 31 U/L (ref 0–44)
AST: 91 U/L — ABNORMAL HIGH (ref 15–41)
Albumin: 2.4 g/dL — ABNORMAL LOW (ref 3.5–5.0)
Alkaline Phosphatase: 249 U/L — ABNORMAL HIGH (ref 38–126)
Bilirubin, Direct: 0.8 mg/dL — ABNORMAL HIGH (ref 0.0–0.2)
Indirect Bilirubin: 1 mg/dL — ABNORMAL HIGH (ref 0.3–0.9)
Total Bilirubin: 1.8 mg/dL — ABNORMAL HIGH (ref 0.3–1.2)
Total Protein: 6.8 g/dL (ref 6.5–8.1)

## 2022-09-30 LAB — CREATININE, URINE, RANDOM: Creatinine, Urine: 14 mg/dL

## 2022-09-30 LAB — BRAIN NATRIURETIC PEPTIDE: B Natriuretic Peptide: 187.2 pg/mL — ABNORMAL HIGH (ref 0.0–100.0)

## 2022-09-30 LAB — TROPONIN I (HIGH SENSITIVITY)
Troponin I (High Sensitivity): 83 ng/L — ABNORMAL HIGH (ref <18)
Troponin I (High Sensitivity): 83 ng/L — ABNORMAL HIGH (ref <18)
Troponin I (High Sensitivity): 62 ng/L — ABNORMAL HIGH (ref <18)

## 2022-09-30 LAB — PROTIME-INR
INR: 1.2 (ref 0.8–1.2)
Prothrombin Time: 15.5 seconds — ABNORMAL HIGH (ref 11.4–15.2)

## 2022-09-30 LAB — TSH: TSH: 1.197 u[IU]/mL (ref 0.350–4.500)

## 2022-09-30 LAB — LIPASE, BLOOD: Lipase: 40 U/L (ref 11–51)

## 2022-09-30 LAB — LACTIC ACID, PLASMA: Lactic Acid, Venous: 1.1 mmol/L (ref 0.5–1.9)

## 2022-09-30 LAB — CK: Total CK: 97 U/L (ref 49–397)

## 2022-09-30 LAB — POC OCCULT BLOOD, ED: Fecal Occult Bld: POSITIVE — AB

## 2022-09-30 LAB — VITAMIN B12: Vitamin B-12: 783 pg/mL (ref 180–914)

## 2022-09-30 LAB — FERRITIN: Ferritin: 46 ng/mL (ref 24–336)

## 2022-09-30 LAB — AMMONIA: Ammonia: 52 umol/L — ABNORMAL HIGH (ref 9–35)

## 2022-09-30 MED ORDER — FOLIC ACID 1 MG PO TABS
1.0000 mg | ORAL_TABLET | Freq: Every day | ORAL | Status: DC
  Administered 2022-10-01 – 2022-10-03 (×3): 1 mg via ORAL
  Filled 2022-09-30 (×3): qty 1

## 2022-09-30 MED ORDER — LORAZEPAM 2 MG/ML IJ SOLN
1.0000 mg | Freq: Once | INTRAMUSCULAR | Status: AC
  Administered 2022-09-30: 1 mg via INTRAVENOUS
  Filled 2022-09-30: qty 1

## 2022-09-30 MED ORDER — MORPHINE SULFATE (PF) 2 MG/ML IV SOLN
2.0000 mg | INTRAVENOUS | Status: DC | PRN
  Administered 2022-10-01 (×4): 2 mg via INTRAVENOUS
  Administered 2022-10-02 (×3): 4 mg via INTRAVENOUS
  Administered 2022-10-02 – 2022-10-03 (×3): 2 mg via INTRAVENOUS
  Filled 2022-09-30: qty 2
  Filled 2022-09-30 (×7): qty 1
  Filled 2022-09-30 (×2): qty 2

## 2022-09-30 MED ORDER — IOHEXOL 350 MG/ML SOLN
80.0000 mL | Freq: Once | INTRAVENOUS | Status: AC | PRN
  Administered 2022-09-30: 80 mL via INTRAVENOUS

## 2022-09-30 MED ORDER — POTASSIUM CHLORIDE CRYS ER 20 MEQ PO TBCR
40.0000 meq | EXTENDED_RELEASE_TABLET | Freq: Once | ORAL | Status: AC
  Administered 2022-09-30: 40 meq via ORAL
  Filled 2022-09-30: qty 2

## 2022-09-30 MED ORDER — MORPHINE SULFATE (PF) 4 MG/ML IV SOLN
4.0000 mg | Freq: Once | INTRAVENOUS | Status: AC
  Administered 2022-09-30: 4 mg via INTRAVENOUS
  Filled 2022-09-30: qty 1

## 2022-09-30 MED ORDER — METRONIDAZOLE 500 MG/100ML IV SOLN
500.0000 mg | Freq: Two times a day (BID) | INTRAVENOUS | Status: DC
  Administered 2022-09-30 – 2022-10-03 (×6): 500 mg via INTRAVENOUS
  Filled 2022-09-30 (×7): qty 100

## 2022-09-30 MED ORDER — PANTOPRAZOLE SODIUM 40 MG IV SOLR
40.0000 mg | Freq: Once | INTRAVENOUS | Status: AC
  Administered 2022-09-30: 40 mg via INTRAVENOUS
  Filled 2022-09-30: qty 10

## 2022-09-30 MED ORDER — THIAMINE MONONITRATE 100 MG PO TABS
100.0000 mg | ORAL_TABLET | Freq: Every day | ORAL | Status: DC
  Administered 2022-10-01 – 2022-10-03 (×3): 100 mg via ORAL
  Filled 2022-09-30 (×3): qty 1

## 2022-09-30 MED ORDER — MORPHINE SULFATE (PF) 4 MG/ML IV SOLN: 4.0000 mg | Freq: Once | INTRAVENOUS | Status: DC

## 2022-09-30 MED ORDER — SODIUM CHLORIDE 0.9 % IV SOLN
2.0000 g | INTRAVENOUS | Status: DC
  Administered 2022-10-01 – 2022-10-02 (×2): 2 g via INTRAVENOUS
  Filled 2022-09-30 (×3): qty 20

## 2022-09-30 MED ORDER — LORAZEPAM 1 MG PO TABS
1.0000 mg | ORAL_TABLET | ORAL | Status: DC | PRN
  Administered 2022-10-01 – 2022-10-02 (×2): 1 mg via ORAL
  Administered 2022-10-02: 2 mg via ORAL
  Filled 2022-09-30: qty 1
  Filled 2022-09-30: qty 2
  Filled 2022-09-30: qty 1

## 2022-09-30 MED ORDER — THIAMINE HCL 100 MG/ML IJ SOLN
100.0000 mg | Freq: Every day | INTRAMUSCULAR | Status: DC
  Filled 2022-09-30 (×2): qty 2

## 2022-09-30 MED ORDER — NICOTINE 21 MG/24HR TD PT24
21.0000 mg | MEDICATED_PATCH | Freq: Every day | TRANSDERMAL | Status: DC
  Administered 2022-09-30 – 2022-10-03 (×4): 21 mg via TRANSDERMAL
  Filled 2022-09-30 (×4): qty 1

## 2022-09-30 MED ORDER — NITROGLYCERIN 0.4 MG SL SUBL
0.4000 mg | SUBLINGUAL_TABLET | SUBLINGUAL | Status: DC | PRN
  Administered 2022-09-30: 0.4 mg via SUBLINGUAL
  Filled 2022-09-30: qty 1

## 2022-09-30 MED ORDER — ADULT MULTIVITAMIN W/MINERALS CH
1.0000 | ORAL_TABLET | Freq: Every day | ORAL | Status: DC
  Administered 2022-10-01 – 2022-10-03 (×3): 1 via ORAL
  Filled 2022-09-30 (×3): qty 1

## 2022-09-30 MED ORDER — LORAZEPAM 2 MG/ML IJ SOLN
1.0000 mg | INTRAMUSCULAR | Status: DC | PRN
  Administered 2022-09-30: 2 mg via INTRAVENOUS
  Administered 2022-10-01 (×3): 1 mg via INTRAVENOUS
  Filled 2022-09-30 (×4): qty 1

## 2022-09-30 MED ORDER — SODIUM CHLORIDE 0.9 % IV SOLN
2.0000 g | Freq: Once | INTRAVENOUS | Status: AC
  Administered 2022-09-30: 2 g via INTRAVENOUS
  Filled 2022-09-30: qty 20

## 2022-09-30 NOTE — ED Triage Notes (Signed)
Patient complains of headache, rectal bleeding, and lower extremity swelling that started after colonoscopy and chest pain that started this morning. Patient is alert, oriented, speaking in complete sentences, and is in no apparent distress at this time.

## 2022-09-30 NOTE — Assessment & Plan Note (Signed)
Hold aspirin given GI bleed continue statin Crestor 40 mg p.o. daily.  Appreciate cardiology involvement

## 2022-09-30 NOTE — Assessment & Plan Note (Signed)
Treated with Rocephin and Flagyl appreciate GI consult gastric panel if able

## 2022-09-30 NOTE — ED Provider Notes (Signed)
[facility] EMERGENCY DEPARTMENT Provider Note   CSN: [id number] Arrival date & time: 09/30/22  1038     History  Chief Complaint  Patient presents with   Chest Pain    Ethan HINNERS is a 51 y.o. male with medical history of cardiac arrest, cocaine abuse, chronic pain, EtOH abuse, GERD, hemorrhoids, hypertension, marijuana abuse, migraines, tobacco abuse.  Patient presents to the ED for evaluation of headache, rectal bleeding, chest pain, shortness of breath.  Patient reports that beginning on Friday he went out to the bar with his friend where he had 2 beers and 1 mixed drink.  The patient reports that the mixed drink tasted "strange" however he is unable to tell me how this tasted strange.  Patient reports that recently there has been a rumor going around this bar that people have been putting sedating medications in a drinks.  The patient reports that despite this, he returned to the bar on Saturday where he had additional alcoholic beverages.  The patient states that he drinks about 5-6 beers per day and has done so every day for the last couple of years.  Patient states that ever since Saturday he has had persistent chest pain which is radiating into his back and his abdomen.  The patient also is reporting headache for the last month every day which is unusual for him.  Patient also complaining of leg swelling, states he has a history of heart failure and is compliant on diuretic therapies.  The patient endorses cocaine use on Friday, states that this "might be what it was in my drink".  Patient also endorsing fever on Saturday.  Patient additionally states that he has had some rectal bleeding on and off since receiving a colonoscopy 1-1/2 years ago.  The patient does endorse a history of hemorrhoids.   Chest Pain Associated symptoms: abdominal pain, fever, headache, nausea and shortness of breath   Associated symptoms: no numbness and no weakness        Home  Medications Prior to Admission medications   Medication Sig Start Date End Date Taking? Authorizing Provider  albuterol (PROVENTIL) (2.5 MG/3ML) 0.083% nebulizer solution Take 3 mLs (2.5 mg total) by nebulization every 6 (six) hours as needed for wheezing or shortness of breath. 09/23/22   Medina-Vargas, Monina C, NP  aspirin 81 MG chewable tablet Chew 1 tablet (81 mg total) by mouth daily. 06/01/22   Sidney Ace, MD  gabapentin (NEURONTIN) 300 MG capsule Take 1 capsule (300 mg total) by mouth 3 (three) times daily. 09/23/22   Medina-Vargas, Monina C, NP  lisinopril (ZESTRIL) 5 MG tablet Take 1 tablet (5 mg total) by mouth daily. 09/23/22   Medina-Vargas, Monina C, NP  ondansetron (ZOFRAN ODT) 4 MG disintegrating tablet Take 1 tablet (4 mg total) by mouth every 8 (eight) hours as needed for nausea or vomiting. 06/01/22   Priscella Mann, Trula Slade, MD  pantoprazole (PROTONIX) 40 MG tablet Take 1 tablet (40 mg total) by mouth daily. 09/23/22   Medina-Vargas, Monina C, NP  rosuvastatin (CRESTOR) 40 MG tablet Take 1 tablet (40 mg total) by mouth daily. 09/23/22   Medina-Vargas, Monina C, NP      Allergies    Penicillins and Other    Review of Systems   Review of Systems  Constitutional:  Positive for fever.  Respiratory:  Positive for shortness of breath.   Cardiovascular:  Positive for chest pain and leg swelling.  Gastrointestinal:  Positive for abdominal pain, blood  in stool and nausea.  Neurological:  Positive for headaches. Negative for weakness and numbness.    Physical Exam Updated Vital Signs BP (!) 150/75   Pulse (!) 104   Temp 98.3 F (36.8 C) (Oral)   Resp 16   SpO2 100%  Physical Exam Vitals and nursing note reviewed.  Constitutional:      General: He is not in acute distress.    Appearance: Normal appearance. He is not ill-appearing, toxic-appearing or diaphoretic.  HENT:     Head: Normocephalic and atraumatic.     Nose: Nose normal. No congestion.     Mouth/Throat:      Mouth: Mucous membranes are moist.     Pharynx: Oropharynx is clear.  Eyes:     Extraocular Movements: Extraocular movements intact.     Conjunctiva/sclera: Conjunctivae normal.     Pupils: Pupils are equal, round, and reactive to light.  Cardiovascular:     Rate and Rhythm: Normal rate and regular rhythm.  Pulmonary:     Effort: Pulmonary effort is normal.     Breath sounds: Normal breath sounds. No wheezing.  Abdominal:     General: Abdomen is flat. Bowel sounds are normal.     Palpations: Abdomen is soft.     Tenderness: There is abdominal tenderness.  Musculoskeletal:     Cervical back: Normal range of motion and neck supple. No tenderness.     Right lower leg: Edema present.     Left lower leg: Edema present.  Skin:    General: Skin is warm and dry.     Capillary Refill: Capillary refill takes less than 2 seconds.  Neurological:     General: No focal deficit present.     Mental Status: He is alert and oriented to person, place, and time.     GCS: GCS eye subscore is 4. GCS verbal subscore is 5. GCS motor subscore is 6.     Cranial Nerves: Cranial nerves 2-12 are intact. No cranial nerve deficit.     Sensory: Sensation is intact. No sensory deficit.     Motor: Motor function is intact. No weakness.     Coordination: Coordination is intact. Heel to [facility] Test normal.     ED Results / Procedures / Treatments   Labs (all labs ordered are listed, but only abnormal results are displayed) Labs Reviewed  CBC - Abnormal; Notable for the following components:      Result Value   RBC 3.18 (*)    Hemoglobin 9.8 (*)    HCT 30.0 (*)    RDW 19.5 (*)    All other components within normal limits  COMPREHENSIVE METABOLIC PANEL - Abnormal; Notable for the following components:   Potassium 3.3 (*)    Glucose, Bld 128 (*)    BUN <5 (*)    Calcium 8.1 (*)    Albumin 2.8 (*)    AST 103 (*)    Alkaline Phosphatase 259 (*)    Total Bilirubin 1.4 (*)    All other components within  normal limits  URINALYSIS, ROUTINE W REFLEX MICROSCOPIC - Abnormal; Notable for the following components:   pH 9.0 (*)    All other components within normal limits  POC OCCULT BLOOD, ED - Abnormal; Notable for the following components:   Fecal Occult Bld POSITIVE (*)    All other components within normal limits  TROPONIN I (HIGH SENSITIVITY) - Abnormal; Notable for the following components:   Troponin I (High Sensitivity) 83 (*)  All other components within normal limits  TROPONIN I (HIGH SENSITIVITY) - Abnormal; Notable for the following components:   Troponin I (High Sensitivity) 83 (*)    All other components within normal limits  RESP PANEL BY RT-PCR (FLU A&B, COVID) ARPGX2  LIPASE, BLOOD  RAPID URINE DRUG SCREEN, HOSP PERFORMED  BRAIN NATRIURETIC PEPTIDE    EKG EKG Interpretation  Date/Time:  Monday September 30 2022 11:50:14 EDT Ventricular Rate:  104 PR Interval:  140 QRS Duration: 78 QT Interval:  384 QTC Calculation: 504 R Axis:   80 Text Interpretation: Sinus tachycardia Nonspecific ST abnormality Abnormal ECG When compared with ECG of 19-Aug-2022 14:59, No significant change since last tracing Confirmed by Garnette Gunner [phone]) on 09/30/2022 6:02:14 PM  Radiology DG Chest 2 View  Result Date: 09/30/2022 CLINICAL DATA:  Chest pain, shortness of breath EXAM: CHEST - 2 VIEW COMPARISON:  Previous studies including the examination of 08/19/2022 FINDINGS: Transverse diameter of heart is increased. Increase in AP diameter of chest suggests COPD. There are no signs of pulmonary edema or focal pulmonary consolidation. There is no pleural effusion or pneumothorax. IMPRESSION: Cardiomegaly. There are no signs of pulmonary edema or focal pulmonary consolidation. Electronically Signed   By: Elmer Picker M.D.   On: 09/30/2022 12:21    Procedures Procedures   Medications Ordered in ED Medications  nitroGLYCERIN (NITROSTAT) SL tablet 0.4 mg (has no administration in  time range)  potassium chloride SA (KLOR-CON M) CR tablet 40 mEq (40 mEq Oral Given 09/30/22 1821)  LORazepam (ATIVAN) injection 1 mg (1 mg Intravenous Given 09/30/22 1821)    ED Course/ Medical Decision Making/ A&P Clinical Course as of 09/30/22 1834  Mon Sep 30, 2022  1754 DG Chest 2 View [CG]    Clinical Course User Index [CG] Azucena Cecil, PA-C                           Medical Decision Making Amount and/or Complexity of Data Reviewed Labs: ordered. Radiology: ordered. Decision-making details documented in ED Course.  Risk Prescription drug management.   51 year old male presents to ED for evaluation.  Please see HPI for further details.  On examination patient tachycardic to 104, afebrile.  Patient lung sounds clear bilaterally, not hypoxic.  Patient abdomen soft and compressible throughout however does endorse generalized abdominal tenderness.  No focal neurodeficits noted on examination.  The patient work-up will include troponin x2, urinalysis, fecal occult blood card, BNP, lipase, CMP, CBC, chest x-ray, CT angio for dissection, CT head.  Patient CBC with a hemoglobin 9.8 however this is unchanged from patient baseline.  The patient CMP shows elevated total bilirubin, alk phos fate, AST however the patient does endorse daily drinking and has done so for quite some time.  The patient lipase is unremarkable.  The patient BNP is pending.  Patient troponin was initially 83, delta troponin 83.  Patient urinalysis unremarkable.  Patient chest x-ray shows cardiomegaly without evidence of effusion.  Patient rapid urine drug screen pending.  Patient EKG nonischemic.  Patient fecal occult blood testing is positive.  Due to recent evidence of cocaine use, the patient will be given 1 mg of Ativan for suspected vasospasm.  Patient also given one 4 mg sublingual nitroglycerin.  The patient also be provided with 40 mEq of oral potassium.  At the end of my shift, the patient work-up  is not complete.  The patient be signed out to my attending Dr. Truett Mainland  for further management.  Patient stable at time of handoff.  Final Clinical Impression(s) / ED Diagnoses Final diagnoses:  Chest pain, unspecified type  Nonintractable headache, unspecified chronicity pattern, unspecified headache type    Rx / DC Orders ED Discharge Orders     None         Lawana Chambers 09/30/22 1840    Cristie Hem, MD 09/30/22 2157

## 2022-09-30 NOTE — Telephone Encounter (Signed)
Below are 2 message that were documented incorrectly:  Medina-Vargas, Monina C, NP  [facility] Clinical Pool 2 days ago    [Pls discontinue use of Imitrex.    Scott, Tammy B  Medina-Vargas, Monina C, NP 6 days ago    Side effects to Imitrex    I did not see that message was followed up on and called patient today to confirm that he was told to stop imitrex and patient states he is on the way to the hospital right now due to the symptoms that he mentioned on 09/24/2022 that have continued.   I will forward this message to Medina-Vargas, Monina C, NP as a FYI.

## 2022-09-30 NOTE — Assessment & Plan Note (Signed)
Spoke about importance of quitting  

## 2022-09-30 NOTE — Assessment & Plan Note (Signed)
Repeat be met and replace as needed

## 2022-09-30 NOTE — Assessment & Plan Note (Signed)
Appreciate cardiology involvement.  Continue cycle cardiac enzymes obtain echogram in the morning Patient reports generalized body aches including chest abdomen the legs back in the setting of recently quitting alcohol and cocaine

## 2022-09-30 NOTE — Assessment & Plan Note (Signed)
Check lipid panel continue Crestor 40 mg p.o. daily

## 2022-09-30 NOTE — Assessment & Plan Note (Signed)
-   Spoke about importance of quitting spent 5 minutes discussing options for treatment, prior attempts at quitting, and dangers of smoking ? -At this point patient is    interested in quitting ? - order nicotine patch  ? - nursing tobacco cessation protocol ? ?

## 2022-09-30 NOTE — Assessment & Plan Note (Signed)
-   will monitor on tele avoid QT prolonging medications, rehydrate correct electrolytes ? ?

## 2022-09-30 NOTE — ED Provider Notes (Signed)
.  Critical Care  Performed by: Cristie Hem, MD Authorized by: Cristie Hem, MD   Critical care provider statement:    Critical care time (minutes):  30   Critical care end time:  09/30/2022 8:24 PM   Critical care time was exclusive of:  Separately billable procedures and treating other patients   Critical care was necessary to treat or prevent imminent or life-threatening deterioration of the following conditions:  Cardiac failure and circulatory failure   Critical care was time spent personally by me on the following activities:  Development of treatment plan with patient or surrogate, discussions with consultants, evaluation of patient's response to treatment, examination of patient, ordering and review of laboratory studies, ordering and review of radiographic studies, ordering and performing treatments and interventions, pulse oximetry, re-evaluation of patient's condition and review of old charts   Care discussed with: admitting provider       Cristie Hem, MD 09/30/22 2025

## 2022-09-30 NOTE — Assessment & Plan Note (Signed)
Vague complaints with known history of CAD.  Somewhat elevated troponins ER discussed with cardiology given ongoing GI bleed will hold off on heparin for now we will continue to monitor serial troponins and make decisions based on that obtain echogram in the morning.  Appreciate cardiology involvement

## 2022-09-30 NOTE — Assessment & Plan Note (Signed)
Avoid fluid overload monitor fluid status

## 2022-09-30 NOTE — Assessment & Plan Note (Signed)
Order CIWA protocol  spoke about quiting drinking

## 2022-09-30 NOTE — Assessment & Plan Note (Signed)
-   Suspect Lower Gi source  - Admit  For further management given:   -most likely colitis If produces any diarrhea can send for gastric panel Could be also due to mesenteric ischemia check lactic acid appreciate GI consult    Admit to progressive given above    -  ER  Provider spoke to gastroenterology (  [facility]) they will see patient in a.m. appreciate their consult   - serial CBC.    - Monitor for any recurrence,  evidence of hemodynamic instability or significant blood loss -  type and screen,  - Transfuse as needed for hemoglobin below 7 or <9 if evidence of significant  bleeding  - Establish at least 2 PIV and fluid resuscitate   - clear liquids for tonight keep nothing by mouth post midnight,  -  monitor for Recurrent significant  Bleeding of red blood and hemodynamic instability in which case Bleeding scan and IR consult would be indicated.

## 2022-09-30 NOTE — Subjective & Objective (Signed)
Hx of CAD and STEMI in 2012 Came in w CP since Friday Stil uses cocaine and drinks etOH  Improved with nitro

## 2022-09-30 NOTE — H&P (Signed)
JAVAUGHN OPDAHL GYJ:[id number] DOB: 10-17-1971 DOA: 09/30/2022     PCP: Nickola Major, NP   Outpatient Specialists:   CARDS:   Dr.McAlhauny    Patient arrived to ER on 09/30/22 at 1038 Referred by Attending Cristie Hem, MD   Patient coming from:    home Lives alone,     Chief Complaint:   Chief Complaint  Patient presents with   Chest Pain    HPI: REFUGIO VANDEVOORDE is a 51 y.o. male with medical history significant of cocaine abuse, CAD    Presented with    body aches Hx of CAD and STEMI in 2012 Came in w CP since Friday Stil uses cocaine and drinks etOH  Improved with nitro    Has been having intermittent rectal bleeding  For few months  Reports generalized body aches  Sore throat Abd pain  He drinks 4-5 beers a day last time had EtOH was 3 days ago  Smokes 1 pack every 2 day  Last cocaine was Friday  He went to a bar his buddy bought him a drink he went home with the friend and by the morning he was feeling bad tired to drink a few beers to calm himself down The rectal bleeding is chronic but this AM he kept having rectal bleeding He has Hemorids     in house  PCR testing  Pending  Lab Results  Component Value Date   [place] (A) 12/21/2021   [place] NEGATIVE 08/07/2021   [place] NEGATIVE 02/27/2021   [place] NEGATIVE 12/19/2020     Regarding pertinent Chronic problems:    Hyperlipidemia -  on statins Zocor (simvastatin)  Lipid Panel     Component Value Date/Time   CHOL 148 09/23/2022 1034   CHOL 174 03/09/2013 0107   TRIG 131 09/23/2022 1034   TRIG 111 03/09/2013 0107   HDL 13 (L) 09/23/2022 1034   HDL 59 03/09/2013 0107   CHOLHDL 11.4 (H) 09/23/2022 1034   VLDL 23 06/05/2020 0203   VLDL 22 03/09/2013 0107   LDLCALC 110 (H) 09/23/2022 1034   [place] 93 03/09/2013 0107     HTN on lisinopril 12/20/2020 Left ventricular ejection fraction, by estimation, is 55 to 60%    CAD  - On Aspirin,  statin,                 -  followed by cardiology                 Last time seen cardiology last year     COPD - not  followed by pulmonology   not  on baseline oxygen        Chronic anemia - baseline hg Hemoglobin & Hematocrit  Recent Labs    08/19/22 1253 09/23/22 1034 09/30/22 1155  HGB 11.2* 10.3* 9.8*     While in ER: Clinical Course as of 09/30/22 2102  Mon Sep 30, 2022  1754 DG Chest 2 View [CG]  2035 Romero Liner [WS]  2041 Troponin I (High Sensitivity) [WS]    Clinical Course User Index [CG] Azucena Cecil, PA-C [WS] Cristie Hem, MD     CXR - Cardiomegaly. There are no signs of pulmonary edema or focal pulmonary consolidation.  CTabd/pelvis -Diffuse colonic wall thickening with mild surrounding inflammation compatible with nonspecific colitis. . New mild fat stranding and haziness in the retroperitoneum at the level of the inferior aorta and IVC. Findings may be related to infectious or  inflammatory process.    Following Medications were ordered in ER: Medications  nitroGLYCERIN (NITROSTAT) SL tablet 0.4 mg (0.4 mg Sublingual Given 09/30/22 1858)  potassium chloride SA (KLOR-CON M) CR tablet 40 mEq (40 mEq Oral Given 09/30/22 1821)  LORazepam (ATIVAN) injection 1 mg (1 mg Intravenous Given 09/30/22 1821)  iohexol (OMNIPAQUE) 350 MG/ML injection 80 mL (80 mLs Intravenous Contrast Given 09/30/22 1954)  morphine (PF) 4 MG/ML injection 4 mg (4 mg Intravenous Given 09/30/22 2017)    __________________________________________________ ER Provider Called:  Cardiology    Dr. Jannifer Franklin They Recommend admit to medicine   Will see in AM      ED Triage Vitals  Enc Vitals Group     BP 09/30/22 1113 132/77     Pulse Rate 09/30/22 1113 (!) 107     Resp 09/30/22 1113 17     Temp 09/30/22 1113 97.8 F (36.6 C)     Temp Source 09/30/22 1113 Oral     SpO2 09/30/22 1113 100 %     Weight --      Height --      Head Circumference --      Peak Flow --       Pain Score 09/30/22 1146 10     Pain Loc --      Pain Edu? --      Excl. in [place]? --   TMAX(24)@     _________________________________________ Significant initial  Findings: Abnormal Labs Reviewed  CBC - Abnormal; Notable for the following components:      Result Value   RBC 3.18 (*)    Hemoglobin 9.8 (*)    HCT 30.0 (*)    RDW 19.5 (*)    All other components within normal limits  COMPREHENSIVE METABOLIC PANEL - Abnormal; Notable for the following components:   Potassium 3.3 (*)    Glucose, Bld 128 (*)    BUN <5 (*)    Calcium 8.1 (*)    Albumin 2.8 (*)    AST 103 (*)    Alkaline Phosphatase 259 (*)    Total Bilirubin 1.4 (*)    All other components within normal limits  URINALYSIS, ROUTINE W REFLEX MICROSCOPIC - Abnormal; Notable for the following components:   pH 9.0 (*)    All other components within normal limits  BRAIN NATRIURETIC PEPTIDE - Abnormal; Notable for the following components:   B Natriuretic Peptide 187.2 (*)    All other components within normal limits  POC OCCULT BLOOD, ED - Abnormal; Notable for the following components:   Fecal Occult Bld POSITIVE (*)    All other components within normal limits  TROPONIN I (HIGH SENSITIVITY) - Abnormal; Notable for the following components:   Troponin I (High Sensitivity) 83 (*)    All other components within normal limits  TROPONIN I (HIGH SENSITIVITY) - Abnormal; Notable for the following components:   Troponin I (High Sensitivity) 83 (*)    All other components within normal limits    _________________________ Troponin  83 - 83 ECG: Ordered Personally reviewed and interpreted by me showing: HR : 104 Rhythm: Sinus tachycardia Cannot rule out Anterior infarct , age undetermined T wave abnormality, consider inferior ischemia Abnormal ECG QTC 504     The recent clinical data is shown below. Vitals:   09/30/22 1742 09/30/22 1758 09/30/22 1845 09/30/22 1930  BP: 122/75 (!) 150/75 124/70 114/71  Pulse: (!) 108  (!) 104 100 (!) 109  Resp: _0 Temp:  98.3 F (36.8 C)     TempSrc: Oral     SpO2: 100%  98% 98%      WBC     Component Value Date/Time   WBC 7.4 09/30/2022 1155   LYMPHSABS 1,353 09/23/2022 1034   MONOABS 0.6 05/28/2022 1415   EOSABS 116 09/23/2022 1034   BASOSABS 62 09/23/2022 1034    Lactic Acid, Venous No results found for: "LATICACIDVEN"     UA   no evidence of UTI      Urine analysis:    Component Value Date/Time   COLORURINE YELLOW 09/30/2022 [place] 09/30/2022 1617   APPEARANCEUR Clear 03/16/2012 1315   LABSPEC 1.011 09/30/2022 1617   LABSPEC 1.013 03/16/2012 1315   PHURINE 9.0 (H) 09/30/2022 1617   GLUCOSEU NEGATIVE 09/30/2022 1617   GLUCOSEU 50 mg/dL 03/16/2012 1315   HGBUR NEGATIVE 09/30/2022 1617   BILIRUBINUR NEGATIVE 09/30/2022 1617   BILIRUBINUR Negative 03/16/2012 1315   KETONESUR NEGATIVE 09/30/2022 1617   PROTEINUR NEGATIVE 09/30/2022 1617   NITRITE NEGATIVE 09/30/2022 [place] 09/30/2022 1617   LEUKOCYTESUR Negative 03/16/2012 1315     _______________________________________________ Hospitalist was called for admission for   Chest pain, unspecified type colitis  The following Work up has been ordered so far:  Orders Placed This Encounter  Procedures   Critical Care   Resp Panel by RT-PCR (Flu A&B, Covid) Anterior Nasal Swab   DG Chest 2 View   CT Head Wo Contrast   CT Angio Chest/Abd/Pel for Dissection W and/or Wo Contrast   CBC   Comprehensive metabolic panel   Urinalysis, Routine w reflex microscopic   Lipase, blood   Urine rapid drug screen (hosp performed)   Brain natriuretic peptide   Diet NPO time specified   Document Height and Actual Weight   Inpatient consult to Cardiology   Consult to hospitalist   Airborne and Contact precautions   POC occult blood, ED   ED EKG   EKG 12-Lead     OTHER Significant initial  Findings:  labs showing:  Recent Labs  Lab 09/30/22 1155  NA  137  K 3.3*  CO2 24  GLUCOSE 128*  BUN <5*  CREATININE 0.62  CALCIUM 8.1*    Cr   stable,   Lab Results  Component Value Date   CREATININE 0.62 09/30/2022   CREATININE 0.49 (L) 09/23/2022   CREATININE 0.68 08/19/2022    Recent Labs  Lab 09/30/22 1155  AST 103*  ALT 34  ALKPHOS 259*  BILITOT 1.4*  PROT 7.7  ALBUMIN 2.8*   Lab Results  Component Value Date   CALCIUM 8.1 (L) 09/30/2022   PHOS 2.0 (L) 08/10/2021       Plt: Lab Results  Component Value Date   PLT 221 09/30/2022        Recent Labs  Lab 09/30/22 1155  WBC 7.4  HGB 9.8*  HCT 30.0*  MCV 94.3  PLT 221    HG/HCT   stable,     Component Value Date/Time   HGB 9.8 (L) 09/30/2022 1155   HGB 15.0 09/06/2013 1458   HCT 30.0 (L) 09/30/2022 1155   HCT 42.2 09/06/2013 1458   MCV 94.3 09/30/2022 1155   MCV 95 09/06/2013 1458     Recent Labs  Lab 09/30/22 1155  LIPASE 40   No results for input(s): "AMMONIA" in the last 168 hours.    Cardiac Panel (last 3 results) No results for input(s): "CKTOTAL", "  CKMB", "TROPONINI", "RELINDX" in the last 72 hours.  .car BNP (last 3 results) Recent Labs    09/30/22 1155  BNP 187.2*           Cultures: No results found for: "SDES", "SPECREQUEST", "CULT", "REPTSTATUS"   Radiological Exams on Admission: CT Angio Chest/Abd/Pel for Dissection W and/or Wo Contrast  Result Date: 09/30/2022 CLINICAL DATA:  Acute aortic syndrome suspected. Headache and rectal bleeding. Chest pain. EXAM: CT ANGIOGRAPHY CHEST, ABDOMEN AND PELVIS TECHNIQUE: Non-contrast CT of the chest was initially obtained. Multidetector CT imaging through the chest, abdomen and pelvis was performed using the standard protocol during bolus administration of intravenous contrast. Multiplanar reconstructed images and MIPs were obtained and reviewed to evaluate the vascular anatomy. RADIATION DOSE REDUCTION: This exam was performed according to the departmental dose-optimization program which  includes automated exposure control, adjustment of the mA and/or kV according to patient size and/or use of iterative reconstruction technique. CONTRAST:  49m OMNIPAQUE IOHEXOL 350 MG/ML SOLN COMPARISON:  CT angio chest, CT abdomen and pelvis 08/07/2021. FINDINGS: CTA CHEST FINDINGS Cardiovascular: Preferential opacification of the thoracic aorta. No evidence of thoracic aortic aneurysm or dissection. Normal heart size. No pericardial effusion. Coronary artery calcifications are present. There is enlargement of the main pulmonary artery which can be seen with pulmonary artery hypertension. There is no central pulmonary embolism. Mediastinum/Nodes: No enlarged mediastinal, hilar, or axillary lymph nodes. Thyroid gland, trachea, and esophagus demonstrate no significant findings. Lungs/Pleura: There is minimal linear scarring in the right lung apex. The lungs are otherwise clear. There is no pleural effusion or pneumothorax. There are minimal secretions in the left mainstem bronchus. Musculoskeletal: No chest wall abnormality. No acute or significant osseous findings. Review of the MIP images confirms the above findings. CTA ABDOMEN AND PELVIS FINDINGS VASCULAR Aorta: Normal caliber aorta without aneurysm, dissection, vasculitis or significant stenosis. Mild calcified atherosclerotic disease is seen throughout the aorta. Celiac: Severe focal stenosis at the origin of the celiac artery. Celiac artery and branches are otherwise within normal limits. SMA: Severe focal stenosis origin of the superior mesenteric artery. Superior mesenteric artery otherwise widely patent and within normal limits. Renals: Both renal arteries are patent without evidence of aneurysm, dissection, vasculitis, fibromuscular dysplasia or significant stenosis. IMA: Patent without evidence of aneurysm, dissection, vasculitis or significant stenosis. Inflow: Patent without evidence of aneurysm, dissection, vasculitis or significant stenosis. Veins:  No obvious venous abnormality within the limitations of this arterial phase study. Review of the MIP images confirms the above findings. NON-VASCULAR Hepatobiliary: Liver is mildly enlarged. No focal liver lesions are seen. No gallstones are identified. No biliary ductal dilatation. Pancreas: Unremarkable. No pancreatic ductal dilatation or surrounding inflammatory changes. Spleen: Mildly enlarged, unchanged. Adrenals/Urinary Tract: Adrenal glands are unremarkable. Kidneys are normal, without renal calculi, focal lesion, or hydronephrosis. Bladder is unremarkable. Stomach/Bowel: There is mild diffuse colonic wall thickening to the level of the rectum. There is mild surrounding inflammation involving the rectum and distal sigmoid colon. Appendix is not definitely seen. Small bowel and stomach are within normal limits. Lymphatic: No enlarged lymph nodes are identified. Reproductive: Prostate is unremarkable. Other: Small fat containing left inguinal hernia. There is no ascites. There is mild fat stranding and haziness in the retroperitoneum at the level of the inferior aorta and IVC. This is new from prior. Musculoskeletal: No acute or significant osseous findings. Review of the MIP images confirms the above findings. IMPRESSION: 1. No evidence for aortic dissection or aneurysm. 2. Diffuse colonic wall thickening with mild surrounding  inflammation compatible with nonspecific colitis. 3. New mild fat stranding and haziness in the retroperitoneum at the level of the inferior aorta and IVC. Findings may be related to infectious or inflammatory process. 4. Severe focal stenosis at the origin of the celiac artery and superior mesenteric artery. 5. Stable hepatosplenomegaly. Electronically Signed   By: Ronney Asters M.D.   On: 09/30/2022 20:14   CT Head Wo Contrast  Result Date: 09/30/2022 CLINICAL DATA:  Headache EXAM: CT HEAD WITHOUT CONTRAST TECHNIQUE: Contiguous axial images were obtained from the base of the skull  through the vertex without intravenous contrast. RADIATION DOSE REDUCTION: This exam was performed according to the departmental dose-optimization program which includes automated exposure control, adjustment of the mA and/or kV according to patient size and/or use of iterative reconstruction technique. COMPARISON:  08/19/2022 FINDINGS: Brain: No evidence of acute infarction, hemorrhage, hydrocephalus, extra-axial collection or mass lesion/mass effect. Mild subcortical white matter and periventricular small vessel ischemic changes. Vascular: Mild intracranial atherosclerosis. Skull: Normal. Negative for fracture or focal lesion. Sinuses/Orbits: The visualized paranasal sinuses are essentially clear. The mastoid air cells are unopacified. Other: None. IMPRESSION: No acute intracranial abnormality. Mild small vessel ischemic changes. Electronically Signed   By: Julian Hy M.D.   On: 09/30/2022 20:06   DG Chest 2 View  Result Date: 09/30/2022 CLINICAL DATA:  Chest pain, shortness of breath EXAM: CHEST - 2 VIEW COMPARISON:  Previous studies including the examination of 08/19/2022 FINDINGS: Transverse diameter of heart is increased. Increase in AP diameter of chest suggests COPD. There are no signs of pulmonary edema or focal pulmonary consolidation. There is no pleural effusion or pneumothorax. IMPRESSION: Cardiomegaly. There are no signs of pulmonary edema or focal pulmonary consolidation. Electronically Signed   By: Elmer Picker M.D.   On: 09/30/2022 12:21   _______________________________________________________________________________________________________ Latest  Blood pressure 114/71, pulse (!) 109, temperature 98.3 F (36.8 C), temperature source Oral, resp. rate 19, SpO2 98 %.   Vitals  labs and radiology finding personally reviewed  Review of Systems:    Pertinent positives include:  fatigue,  shortness of breath at rest dyspnea on exertion, productive cough, brown or  white Constitutional:  No weight loss, night sweats, Fevers, chills, weight loss  HEENT:  No headaches, Difficulty swallowing,Tooth/dental problems,Sore throat,  No sneezing, itching, ear ache, nasal congestion, post nasal drip,  Cardio-vascular:  No chest pain, Orthopnea, PND, anasarca, dizziness, palpitations.no Bilateral lower extremity swelling  GI:  No heartburn, indigestion, abdominal pain, nausea, vomiting, diarrhea, change in bowel habits, loss of appetite, melena, blood in stool, hematemesis Resp:  no . No No excess mucus, no  No non-productive cough, No coughing up of blood.No change in color of mucus.No wheezing. Skin:  no rash or lesions. No jaundice GU:  no dysuria, change in color of urine, no urgency or frequency. No straining to urinate.  No flank pain.  Musculoskeletal:  No joint pain or no joint swelling. No decreased range of motion. No back pain.  Psych:  No change in mood or affect. No depression or anxiety. No memory loss.  Neuro: no localizing neurological complaints, no tingling, no weakness, no double vision, no gait abnormality, no slurred speech, no confusion  All systems reviewed and apart from [place] all are negative _______________________________________________________________________________________________ Past Medical History:   Past Medical History:  Diagnosis Date   CAD (coronary artery disease)    a. 01/2011 Anterior STEMI/Cath/PCI: LM nl, LAD 100d (3.5x73m Vision BMS placed), LCX 264mRI 50, RCA min irregs, EF  40% w/ apical, inferoapical HK.   Cardiac arrest - ventricular fibrillation    a. In setting of STEMI 01/2011   Chronic pain    Cocaine abuse ([place])    Depression with anxiety    ETOH abuse    a. 6-12 beers/day   GERD (gastroesophageal reflux disease)    Hemorrhoids    Hyperlipidemia    Hypertension    Ischemic cardiomyopathy    a. 06/2011 Echo: EF 45-50%, No rwma   Marijuana abuse    Migraines    Tobacco abuse    a. 1/2 ppd x 26  yrs      Past Surgical History:  Procedure Laterality Date   COLONOSCOPY  08/09/2021   Procedure: COLONOSCOPY;  Surgeon: Lucilla Lame, MD;  Location: [facility] ENDOSCOPY;  Service: Endoscopy;;   CORONARY ANGIOPLASTY WITH STENT PLACEMENT     ESOPHAGOGASTRODUODENOSCOPY N/A 05/30/2022   Procedure: ESOPHAGOGASTRODUODENOSCOPY (EGD);  Surgeon: Lin Landsman, MD;  Location: [facility] ENDOSCOPY;  Service: Gastroenterology;  Laterality: N/A;   ESOPHAGOGASTRODUODENOSCOPY (EGD) WITH PROPOFOL N/A 03/01/2021   Procedure: ESOPHAGOGASTRODUODENOSCOPY (EGD) WITH PROPOFOL;  Surgeon: Lin Landsman, MD;  Location: [place];  Service: Endoscopy;  Laterality: N/A;   ESOPHAGOGASTRODUODENOSCOPY (EGD) WITH PROPOFOL N/A 08/09/2021   Procedure: ESOPHAGOGASTRODUODENOSCOPY (EGD) WITH PROPOFOL;  Surgeon: Lucilla Lame, MD;  Location: [facility] ENDOSCOPY;  Service: Endoscopy;  Laterality: N/A;    Social History:  Ambulatory   independently . walker       reports that he has been smoking cigarettes. He has a 20.50 pack-year smoking history. He has never used smokeless tobacco. He reports current alcohol use of about 12.0 standard drinks of alcohol per week. He reports that he does not currently use drugs after having used the following drugs: Cocaine and Marijuana.     Family History:  Family History  Problem Relation Age of Onset   Coronary artery disease Mother        alive   Other Other        2 sisters alive and well   Alcohol abuse Father        died @ 56   Other Paternal Grandmother        PACER   ______________________________________________________________________________________________ Allergies: Allergies  Allergen Reactions   Penicillins Hives   Other Rash    chives     Prior to Admission medications   Medication Sig Start Date End Date Taking? Authorizing Provider  aspirin 81 MG chewable tablet Chew 1 tablet (81 mg total) by mouth daily. 06/01/22  Yes Sreenath, Sudheer B, MD   gabapentin (NEURONTIN) 300 MG capsule Take 1 capsule (300 mg total) by mouth 3 (three) times daily. 09/23/22  Yes Medina-Vargas, Monina C, NP  lisinopril (ZESTRIL) 5 MG tablet Take 1 tablet (5 mg total) by mouth daily. 09/23/22  Yes Medina-Vargas, Monina C, NP  pantoprazole (PROTONIX) 40 MG tablet Take 1 tablet (40 mg total) by mouth daily. 09/23/22  Yes Medina-Vargas, Monina C, NP  rosuvastatin (CRESTOR) 40 MG tablet Take 1 tablet (40 mg total) by mouth daily. 09/23/22  Yes Medina-Vargas, Monina C, NP  albuterol (PROVENTIL) (2.5 MG/3ML) 0.083% nebulizer solution Take 3 mLs (2.5 mg total) by nebulization every 6 (six) hours as needed for wheezing or shortness of breath. Patient not taking: Reported on 09/30/2022 09/23/22   Medina-Vargas, Monina C, NP  ondansetron (ZOFRAN ODT) 4 MG disintegrating tablet Take 1 tablet (4 mg total) by mouth every 8 (eight) hours as needed for nausea or vomiting. Patient not taking:  Reported on 09/30/2022 06/01/22   Sidney Ace, MD    ___________________________________________________________________________________________________ Physical Exam:    09/30/2022    7:30 PM 09/30/2022    6:45 PM 09/30/2022    5:58 PM  Vitals with BMI  Systolic 347 425 956  Diastolic 71 70 75  Pulse 387 100 104     1. General:  in No  Acute distress   Chronically ill   -appearing 2. Psychological: Alert and   Oriented 3. Head/ENT:    Dry Mucous Membranes                          Head Non traumatic, neck supple                       Poor Dentition 4. SKIN:  decreased Skin turgor,  Skin clean Dry and intact no rash 5. Heart: Regular rate and rhythm no  Murmur, no Rub or gallop 6. Lungs:  , no wheezes some crackles   7. Abdomen: Soft, generalized -tender, Non distended   obese  bowel sounds present 8. Lower extremities: no clubbing, cyanosis, trace edema 9. Neurologically G strength 5 out of 5 in all 4 extremities cranial nerves II through XII intact tremolous 10. MSK:  Normal range of motion    Chart has been reviewed  ______________________________________________________________________________________________  Assessment/Plan 51 y.o. male with medical history significant of cocaine abuse, CAD    Admitted for   Chest pain, body aches, anemia colitis      Present on Admission:  Colitis  Prolonged QT interval  Alcohol use disorder  Hyperlipidemia  GI bleed  Chest pain  CAD (coronary artery disease)  Hypokalemia  TOBACCO ABUSE  Chronic systolic CHF (congestive heart failure) (HCC)  Cocaine abuse (HCC)  Elevated troponin     Prolonged QT interval - will monitor on tele avoid QT prolonging medications, rehydrate correct electrolytes   Alcohol use disorder Order CIWA protocol  spoke about quiting drinking  Hyperlipidemia Check lipid panel continue Crestor 40 mg p.o. daily  GI bleed - Suspect Lower Gi source  - Admit  For further management given:   -most likely colitis If produces any diarrhea can send for gastric panel Could be also due to mesenteric ischemia check lactic acid appreciate GI consult    Admit to progressive given above    -  ER  Provider spoke to gastroenterology (  LB) they will see patient in a.m. appreciate their consult   - serial CBC.    - Monitor for any recurrence,  evidence of hemodynamic instability or significant blood loss -  type and screen,  - Transfuse as needed for hemoglobin below 7 or <9 if evidence of significant  bleeding  - Establish at least 2 PIV and fluid resuscitate   - clear liquids for tonight keep nothing by mouth post midnight,  -  monitor for Recurrent significant  Bleeding of red blood and hemodynamic instability in which case Bleeding scan and IR consult would be indicated.    Chest pain Vague complaints with known history of CAD.  Somewhat elevated troponins ER discussed with cardiology given ongoing GI bleed will hold off on heparin for now we will continue to monitor serial  troponins and make decisions based on that obtain echogram in the morning.  Appreciate cardiology involvement  CAD (coronary artery disease) Hold aspirin given GI bleed continue statin Crestor 40 mg p.o. daily.  Appreciate cardiology involvement  Hypokalemia Repeat be met and replace as needed  TOBACCO ABUSE  - Spoke about importance of quitting spent 5 minutes discussing options for treatment, prior attempts at quitting, and dangers of smoking  -At this point patient is    interested in quitting  - order nicotine patch   - nursing tobacco cessation protocol   Colitis Treated with Rocephin and Flagyl appreciate GI consult gastric panel if able  Chronic systolic CHF (congestive heart failure) (HCC) Avoid fluid overload monitor fluid status  Cocaine abuse ([place]) Spoke about importance of quitting  Elevated troponin Appreciate cardiology involvement.  Continue cycle cardiac enzymes obtain echogram in the morning Patient reports generalized body aches including chest abdomen the legs back in the setting of recently quitting alcohol and cocaine   Other plan as per orders.  DVT prophylaxis:  SCD    Code Status:    Code Status: Prior FULL CODE as per patient   I had personally discussed CODE STATUS with patient    Family Communication:   Family not at  Bedside    Disposition Plan:    To home once workup is complete and patient is stable   Following barriers for discharge:                     Will need consultants to evaluate patient prior to discharge                      Would benefit from PT/OT eval prior to DC  Ordered                                       Transition of care consulted                   Nutrition    consulted                               Consults called: Cardiology  Admission status:  ED Disposition     ED Disposition  Admit   Condition  --   [place]: [place] [[id number]]  Level of Care: Progressive [102]  Admit  to Progressive based on following criteria: CARDIOVASCULAR & THORACIC of moderate stability with acute coronary syndrome symptoms/low risk myocardial infarction/hypertensive urgency/arrhythmias/heart failure potentially compromising stability and stable post cardiovascular intervention patients.  May place patient in observation at [facility] or [place] if equivalent level of care is available:: No  Covid Evaluation: Asymptomatic - no recent exposure (last 10 days) testing not required  Diagnosis: Colitis [[id number]]  Admitting Physician: Toy Baker [[id number]]  Attending Physician: Toy Baker [[id number]]           Obs    Level of care       progressive tele indefinitely please discontinue once patient no    Naheim Burgen 09/30/2022, 10:43 PM    [facility]     after 2 AM please page floor coverage PA If 7AM-7PM, please contact the day team taking care of the patient using [email or web address]   Patient was evaluated in the context of the global COVID-19 pandemic, which necessitated consideration that the patient might be at risk for infection with the SARS-CoV-2 virus that causes COVID-19. Institutional protocols and algorithms that pertain to the evaluation of patients at risk for COVID-19  are in a state of rapid change based on information released by regulatory bodies including the CDC and federal and state organizations. These policies and algorithms were followed during the patient's care.

## 2022-10-01 ENCOUNTER — Observation Stay (HOSPITAL_COMMUNITY): Payer: Medicare HMO

## 2022-10-01 DIAGNOSIS — R7989 Other specified abnormal findings of blood chemistry: Secondary | ICD-10-CM | POA: Diagnosis not present

## 2022-10-01 DIAGNOSIS — A09 Infectious gastroenteritis and colitis, unspecified: Secondary | ICD-10-CM | POA: Diagnosis present

## 2022-10-01 DIAGNOSIS — I25118 Atherosclerotic heart disease of native coronary artery with other forms of angina pectoris: Secondary | ICD-10-CM

## 2022-10-01 DIAGNOSIS — R079 Chest pain, unspecified: Secondary | ICD-10-CM | POA: Diagnosis not present

## 2022-10-01 DIAGNOSIS — I5042 Chronic combined systolic (congestive) and diastolic (congestive) heart failure: Secondary | ICD-10-CM | POA: Diagnosis present

## 2022-10-01 DIAGNOSIS — K700 Alcoholic fatty liver: Secondary | ICD-10-CM | POA: Diagnosis present

## 2022-10-01 DIAGNOSIS — K529 Noninfective gastroenteritis and colitis, unspecified: Secondary | ICD-10-CM | POA: Diagnosis present

## 2022-10-01 DIAGNOSIS — F17210 Nicotine dependence, cigarettes, uncomplicated: Secondary | ICD-10-CM | POA: Diagnosis present

## 2022-10-01 DIAGNOSIS — F1410 Cocaine abuse, uncomplicated: Secondary | ICD-10-CM | POA: Diagnosis present

## 2022-10-01 DIAGNOSIS — E876 Hypokalemia: Secondary | ICD-10-CM | POA: Diagnosis present

## 2022-10-01 DIAGNOSIS — E785 Hyperlipidemia, unspecified: Secondary | ICD-10-CM | POA: Diagnosis present

## 2022-10-01 DIAGNOSIS — R933 Abnormal findings on diagnostic imaging of other parts of digestive tract: Secondary | ICD-10-CM | POA: Diagnosis not present

## 2022-10-01 DIAGNOSIS — E119 Type 2 diabetes mellitus without complications: Secondary | ICD-10-CM | POA: Diagnosis present

## 2022-10-01 DIAGNOSIS — I255 Ischemic cardiomyopathy: Secondary | ICD-10-CM | POA: Diagnosis present

## 2022-10-01 DIAGNOSIS — F32A Depression, unspecified: Secondary | ICD-10-CM | POA: Diagnosis present

## 2022-10-01 DIAGNOSIS — F1010 Alcohol abuse, uncomplicated: Secondary | ICD-10-CM | POA: Diagnosis present

## 2022-10-01 DIAGNOSIS — K649 Unspecified hemorrhoids: Secondary | ICD-10-CM | POA: Diagnosis not present

## 2022-10-01 DIAGNOSIS — E669 Obesity, unspecified: Secondary | ICD-10-CM | POA: Diagnosis present

## 2022-10-01 DIAGNOSIS — K625 Hemorrhage of anus and rectum: Secondary | ICD-10-CM | POA: Diagnosis present

## 2022-10-01 DIAGNOSIS — I5022 Chronic systolic (congestive) heart failure: Secondary | ICD-10-CM | POA: Diagnosis not present

## 2022-10-01 DIAGNOSIS — K766 Portal hypertension: Secondary | ICD-10-CM | POA: Diagnosis present

## 2022-10-01 DIAGNOSIS — D62 Acute posthemorrhagic anemia: Secondary | ICD-10-CM | POA: Diagnosis not present

## 2022-10-01 DIAGNOSIS — E871 Hypo-osmolality and hyponatremia: Secondary | ICD-10-CM | POA: Diagnosis not present

## 2022-10-01 DIAGNOSIS — K640 First degree hemorrhoids: Secondary | ICD-10-CM | POA: Diagnosis present

## 2022-10-01 DIAGNOSIS — Z1152 Encounter for screening for COVID-19: Secondary | ICD-10-CM | POA: Diagnosis not present

## 2022-10-01 DIAGNOSIS — R778 Other specified abnormalities of plasma proteins: Secondary | ICD-10-CM

## 2022-10-01 DIAGNOSIS — I509 Heart failure, unspecified: Secondary | ICD-10-CM | POA: Diagnosis not present

## 2022-10-01 DIAGNOSIS — Z79899 Other long term (current) drug therapy: Secondary | ICD-10-CM | POA: Diagnosis not present

## 2022-10-01 DIAGNOSIS — K7460 Unspecified cirrhosis of liver: Secondary | ICD-10-CM | POA: Diagnosis present

## 2022-10-01 DIAGNOSIS — K921 Melena: Secondary | ICD-10-CM | POA: Diagnosis not present

## 2022-10-01 DIAGNOSIS — I110 Hypertensive heart disease with heart failure: Secondary | ICD-10-CM | POA: Diagnosis present

## 2022-10-01 DIAGNOSIS — F1090 Alcohol use, unspecified, uncomplicated: Secondary | ICD-10-CM | POA: Diagnosis not present

## 2022-10-01 DIAGNOSIS — I2510 Atherosclerotic heart disease of native coronary artery without angina pectoris: Secondary | ICD-10-CM | POA: Diagnosis not present

## 2022-10-01 DIAGNOSIS — K551 Chronic vascular disorders of intestine: Secondary | ICD-10-CM | POA: Diagnosis present

## 2022-10-01 LAB — ECHOCARDIOGRAM COMPLETE
Area-P 1/2: 5.32 cm2
Calc EF: 64 %
Height: 65 in
S' Lateral: 4.05 cm
Single Plane A2C EF: 66.6 %
Single Plane A4C EF: 60.2 %
Weight: 2992 oz

## 2022-10-01 LAB — COMPREHENSIVE METABOLIC PANEL
ALT: 29 U/L (ref 0–44)
AST: 90 U/L — ABNORMAL HIGH (ref 15–41)
Albumin: 2.3 g/dL — ABNORMAL LOW (ref 3.5–5.0)
Alkaline Phosphatase: 237 U/L — ABNORMAL HIGH (ref 38–126)
Anion gap: 8 (ref 5–15)
BUN: <5 mg/dL — ABNORMAL LOW (ref 6–20)
CO2: 20 mmol/L — ABNORMAL LOW (ref 22–32)
Calcium: 7.5 mg/dL — ABNORMAL LOW (ref 8.9–10.3)
Chloride: 101 mmol/L (ref 98–111)
Creatinine, Ser: 0.72 mg/dL (ref 0.61–1.24)
GFR, Estimated: >60 mL/min (ref >60)
Glucose, Bld: 116 mg/dL — ABNORMAL HIGH (ref 70–99)
Potassium: 3.5 mmol/L (ref 3.5–5.1)
Sodium: 129 mmol/L — ABNORMAL LOW (ref 135–145)
Total Bilirubin: 1.9 mg/dL — ABNORMAL HIGH (ref 0.3–1.2)
Total Protein: 6.5 g/dL (ref 6.5–8.1)

## 2022-10-01 LAB — CBC
HCT: 25.2 % — ABNORMAL LOW (ref 39.0–52.0)
HCT: 26.7 % — ABNORMAL LOW (ref 39.0–52.0)
HCT: 26.1 % — ABNORMAL LOW (ref 39.0–52.0)
Hemoglobin: 8.3 g/dL — ABNORMAL LOW (ref 13.0–17.0)
Hemoglobin: 8.4 g/dL — ABNORMAL LOW (ref 13.0–17.0)
Hemoglobin: 8.2 g/dL — ABNORMAL LOW (ref 13.0–17.0)
MCH: 31.2 pg (ref 26.0–34.0)
MCH: 30.3 pg (ref 26.0–34.0)
MCH: 30.7 pg (ref 26.0–34.0)
MCHC: 32.9 g/dL (ref 30.0–36.0)
MCHC: 31.5 g/dL (ref 30.0–36.0)
MCHC: 31.4 g/dL (ref 30.0–36.0)
MCV: 94.7 fL (ref 80.0–100.0)
MCV: 96.4 fL (ref 80.0–100.0)
MCV: 97.8 fL (ref 80.0–100.0)
Platelets: 154 10*3/uL (ref 150–400)
Platelets: 151 10*3/uL (ref 150–400)
Platelets: 146 10*3/uL — ABNORMAL LOW (ref 150–400)
RBC: 2.66 MIL/uL — ABNORMAL LOW (ref 4.22–5.81)
RBC: 2.77 MIL/uL — ABNORMAL LOW (ref 4.22–5.81)
RBC: 2.67 MIL/uL — ABNORMAL LOW (ref 4.22–5.81)
RDW: 19.7 % — ABNORMAL HIGH (ref 11.5–15.5)
RDW: 19.3 % — ABNORMAL HIGH (ref 11.5–15.5)
RDW: 19.2 % — ABNORMAL HIGH (ref 11.5–15.5)
WBC: 6.1 10*3/uL (ref 4.0–10.5)
WBC: 5.2 10*3/uL (ref 4.0–10.5)
WBC: 4.7 10*3/uL (ref 4.0–10.5)
nRBC: 0.3 % — ABNORMAL HIGH (ref 0.0–0.2)
nRBC: 0 % (ref 0.0–0.2)
nRBC: 0 % (ref 0.0–0.2)

## 2022-10-01 LAB — BASIC METABOLIC PANEL
Anion gap: 6 (ref 5–15)
BUN: <5 mg/dL — ABNORMAL LOW (ref 6–20)
CO2: 22 mmol/L (ref 22–32)
Calcium: 7.8 mg/dL — ABNORMAL LOW (ref 8.9–10.3)
Chloride: 105 mmol/L (ref 98–111)
Creatinine, Ser: 0.61 mg/dL (ref 0.61–1.24)
GFR, Estimated: >60 mL/min (ref >60)
Glucose, Bld: 91 mg/dL (ref 70–99)
Potassium: 3.6 mmol/L (ref 3.5–5.1)
Sodium: 133 mmol/L — ABNORMAL LOW (ref 135–145)

## 2022-10-01 LAB — HEMOGLOBIN A1C
Hgb A1c MFr Bld: 4.7 % — ABNORMAL LOW (ref 4.8–5.6)
Mean Plasma Glucose: 88.19 mg/dL

## 2022-10-01 LAB — LIPID PANEL
Cholesterol: 120 mg/dL (ref 0–200)
HDL: 20 mg/dL — ABNORMAL LOW (ref >40)
LDL Cholesterol: 87 mg/dL (ref 0–99)
Total CHOL/HDL Ratio: 6 RATIO
Triglycerides: 64 mg/dL (ref <150)
VLDL: 13 mg/dL (ref 0–40)

## 2022-10-01 LAB — PREALBUMIN: Prealbumin: 5 mg/dL — ABNORMAL LOW (ref 18–38)

## 2022-10-01 LAB — CBG MONITORING, ED
Glucose-Capillary: 130 mg/dL — ABNORMAL HIGH (ref 70–99)
Glucose-Capillary: 90 mg/dL (ref 70–99)
Glucose-Capillary: 99 mg/dL (ref 70–99)
Glucose-Capillary: 112 mg/dL — ABNORMAL HIGH (ref 70–99)
Glucose-Capillary: 118 mg/dL — ABNORMAL HIGH (ref 70–99)
Glucose-Capillary: 125 mg/dL — ABNORMAL HIGH (ref 70–99)

## 2022-10-01 LAB — PHOSPHORUS: Phosphorus: 1.8 mg/dL — ABNORMAL LOW (ref 2.5–4.6)

## 2022-10-01 LAB — OSMOLALITY: Osmolality: 279 mOsm/kg (ref 275–295)

## 2022-10-01 LAB — FOLATE: Folate: 3.3 ng/mL — ABNORMAL LOW (ref >5.9)

## 2022-10-01 LAB — MAGNESIUM: Magnesium: 1.6 mg/dL — ABNORMAL LOW (ref 1.7–2.4)

## 2022-10-01 LAB — TROPONIN I (HIGH SENSITIVITY): Troponin I (High Sensitivity): 64 ng/L — ABNORMAL HIGH (ref <18)

## 2022-10-01 MED ORDER — ACETAMINOPHEN 325 MG PO TABS: 650.0000 mg | ORAL_TABLET | Freq: Four times a day (QID) | ORAL | Status: DC | PRN

## 2022-10-01 MED ORDER — INSULIN ASPART 100 UNIT/ML IJ SOLN
0.0000 [IU] | Freq: Three times a day (TID) | INTRAMUSCULAR | Status: DC
  Administered 2022-10-03 (×2): 2 [IU] via SUBCUTANEOUS

## 2022-10-01 MED ORDER — PANTOPRAZOLE SODIUM 40 MG IV SOLR
40.0000 mg | Freq: Two times a day (BID) | INTRAVENOUS | Status: DC
  Administered 2022-10-01: 40 mg via INTRAVENOUS
  Filled 2022-10-01 (×2): qty 10

## 2022-10-01 MED ORDER — SODIUM CHLORIDE 0.9% FLUSH: 3.0000 mL | INTRAVENOUS | Status: DC | PRN

## 2022-10-01 MED ORDER — K PHOS MONO-SOD PHOS DI & MONO 155-852-130 MG PO TABS
500.0000 mg | ORAL_TABLET | Freq: Three times a day (TID) | ORAL | Status: DC
  Administered 2022-10-01 – 2022-10-02 (×6): 500 mg via ORAL
  Filled 2022-10-01 (×8): qty 2

## 2022-10-01 MED ORDER — PANTOPRAZOLE SODIUM 40 MG PO TBEC: 40.0000 mg | DELAYED_RELEASE_TABLET | Freq: Every day | ORAL | Status: DC

## 2022-10-01 MED ORDER — MAGNESIUM SULFATE IN D5W 1-5 GM/100ML-% IV SOLN
1.0000 g | Freq: Once | INTRAVENOUS | Status: AC
  Administered 2022-10-01: 1 g via INTRAVENOUS
  Filled 2022-10-01: qty 100

## 2022-10-01 MED ORDER — ROSUVASTATIN CALCIUM 20 MG PO TABS
40.0000 mg | ORAL_TABLET | Freq: Every day | ORAL | Status: DC
  Administered 2022-10-01 – 2022-10-03 (×3): 40 mg via ORAL
  Filled 2022-10-01 (×3): qty 2

## 2022-10-01 MED ORDER — MAGNESIUM SULFATE 2 GM/50ML IV SOLN
2.0000 g | Freq: Once | INTRAVENOUS | Status: AC
  Administered 2022-10-01: 2 g via INTRAVENOUS
  Filled 2022-10-01: qty 50

## 2022-10-01 MED ORDER — PANTOPRAZOLE SODIUM 40 MG PO TBEC
40.0000 mg | DELAYED_RELEASE_TABLET | Freq: Every day | ORAL | Status: DC
  Administered 2022-10-02 – 2022-10-03 (×2): 40 mg via ORAL
  Filled 2022-10-01 (×2): qty 1

## 2022-10-01 MED ORDER — HYDROCODONE-ACETAMINOPHEN 5-325 MG PO TABS
1.0000 | ORAL_TABLET | ORAL | Status: DC | PRN
  Administered 2022-10-01 – 2022-10-03 (×8): 2 via ORAL
  Filled 2022-10-01 (×8): qty 2

## 2022-10-01 MED ORDER — ACETAMINOPHEN 650 MG RE SUPP: 650.0000 mg | Freq: Four times a day (QID) | RECTAL | Status: DC | PRN

## 2022-10-01 MED ORDER — GABAPENTIN 300 MG PO CAPS
300.0000 mg | ORAL_CAPSULE | Freq: Three times a day (TID) | ORAL | Status: DC
  Administered 2022-10-01 – 2022-10-03 (×8): 300 mg via ORAL
  Filled 2022-10-01 (×8): qty 1

## 2022-10-01 MED ORDER — SODIUM CHLORIDE 0.9 % IV SOLN
250.0000 mL | INTRAVENOUS | Status: DC | PRN
  Administered 2022-10-01: 250 mL via INTRAVENOUS

## 2022-10-01 MED ORDER — DILTIAZEM HCL 60 MG PO TABS
30.0000 mg | ORAL_TABLET | Freq: Two times a day (BID) | ORAL | Status: DC
  Administered 2022-10-01 – 2022-10-03 (×4): 30 mg via ORAL
  Filled 2022-10-01 (×6): qty 1

## 2022-10-01 MED ORDER — SODIUM CHLORIDE 0.9% FLUSH
3.0000 mL | Freq: Two times a day (BID) | INTRAVENOUS | Status: DC
  Administered 2022-10-01 – 2022-10-03 (×5): 3 mL via INTRAVENOUS

## 2022-10-01 MED ORDER — GUAIFENESIN ER 600 MG PO TB12
600.0000 mg | ORAL_TABLET | Freq: Two times a day (BID) | ORAL | Status: DC
  Administered 2022-10-01 – 2022-10-03 (×6): 600 mg via ORAL
  Filled 2022-10-01 (×6): qty 1

## 2022-10-01 MED ORDER — PERFLUTREN LIPID MICROSPHERE
1.0000 mL | INTRAVENOUS | Status: AC | PRN
  Administered 2022-10-01: 2 mL via INTRAVENOUS
  Filled 2022-10-01: qty 10

## 2022-10-01 MED ORDER — INSULIN ASPART 100 UNIT/ML IJ SOLN
0.0000 [IU] | INTRAMUSCULAR | Status: DC
  Administered 2022-10-01 (×2): 1 [IU] via SUBCUTANEOUS

## 2022-10-01 MED ORDER — ALBUTEROL SULFATE (2.5 MG/3ML) 0.083% IN NEBU: 2.5000 mg | INHALATION_SOLUTION | RESPIRATORY_TRACT | Status: DC | PRN

## 2022-10-01 MED ORDER — HYDROCORTISONE ACETATE 25 MG RE SUPP
25.0000 mg | Freq: Two times a day (BID) | RECTAL | Status: DC
  Administered 2022-10-01 – 2022-10-03 (×3): 25 mg via RECTAL
  Filled 2022-10-01 (×7): qty 1

## 2022-10-01 NOTE — Care Management Obs Status (Signed)
[place] NOTIFICATION   Patient Details  Name: Ethan Wells MRN: [id number] Date of Birth: 1971-11-17   Medicare Observation Status Notification Given:  Ernesta Amble, RN 10/01/2022, 4:52 PM

## 2022-10-01 NOTE — ED Notes (Signed)
Echo currently at bedside

## 2022-10-01 NOTE — ED Notes (Signed)
Patient is constantly asking when I enter the room is that the stuff I like referring to the Morphine. Patient has been resting well and does not appear to be in pain. Even falling asleep. Will continue to monitor pain needs.

## 2022-10-01 NOTE — Progress Notes (Signed)
CSW attempted to meet with patient at bedside however MD present and providing medical care.  Madilyn Fireman, MSW, LCSW Transitions of Care  Clinical Social Worker II [phone]

## 2022-10-01 NOTE — Evaluation (Signed)
Occupational Therapy Evaluation Patient Details Name: Ethan Wells MRN: [id number] DOB: Jun 07, 1971 Today's Date: 10/01/2022   History of Present Illness Pt is a 51 y/o male who presented with acute on chronic rectal bleeding. Admitted for further colitis workup. PMH: CAD, cardiac arrest, etoh abuse, cocaine abuse, HTN, migraines   Clinical Impression   PTA, pt lives in a camper on friend's property. Pt reports Modified Independence with ADLs and mobility using cane. Pt presents now with deficits in strength, endurance and standing balance. Difficult to fully assess cognition as pt appeared drowsy. Overall, pt requires Min A for bed mobility, Min A to stand and take several unsteady steps along stretcher. Pt requires Setup for UB ADL and up to Mod A for LB ADLs. Based on current presentation and fall risk, would rec SNF rehab. However, with appropriate DME education and continued mobility, pt likely able to progress to home. Will continue to follow acutely.       Recommendations for follow up therapy are one component of a multi-disciplinary discharge planning process, led by the attending physician.  Recommendations may be updated based on patient status, additional functional criteria and insurance authorization.   Follow Up Recommendations  Skilled nursing-short term rehab (<3 hours/day) (though may progress to home)    Assistance Recommended at Discharge Frequent or constant Supervision/Assistance  Patient can return home with the following A little help with walking and/or transfers;A little help with bathing/dressing/bathroom;Assistance with cooking/housework;Assist for transportation    Functional Status Assessment  Patient has had a recent decline in their functional status and demonstrates the ability to make significant improvements in function in a reasonable and predictable amount of time.  Equipment Recommendations  Other (comment) (RW)    Recommendations for Other  Services       Precautions / Restrictions Precautions Precautions: Fall Restrictions Weight Bearing Restrictions: No      Mobility Bed Mobility Overal bed mobility: Needs Assistance Bed Mobility: Supine to Sit, Sit to Supine     Supine to sit: Min assist, HOB elevated Sit to supine: Min guard   General bed mobility comments: handheld assist to lift trunk from stretcher    Transfers Overall transfer level: Needs assistance Equipment used: 1 person hand held assist Transfers: Sit to/from Stand Sit to Stand: Min assist           General transfer comment: Required assist to steady self in standing, improved with UE support to take steps at bedside      Balance Overall balance assessment: Needs assistance Sitting-balance support: No upper extremity supported, Feet supported Sitting balance-Leahy Scale: Fair     Standing balance support: No upper extremity supported, During functional activity Standing balance-Leahy Scale: Poor                             ADL either performed or assessed with clinical judgement   ADL Overall ADL's : Needs assistance/impaired Eating/Feeding: Independent   Grooming: Min guard;Standing   Upper Body Bathing: Set up;Sitting   Lower Body Bathing: Moderate assistance;Sit to/from stand   Upper Body Dressing : Set up;Sitting   Lower Body Dressing: Moderate assistance;Sit to/from stand   Toilet Transfer: Minimal assistance;Rolling walker (2 wheels)   Toileting- Clothing Manipulation and Hygiene: Minimal assistance;Sit to/from stand;Sitting/lateral lean         General ADL Comments: Pt unsteady in standing, groggy-appearing - may benefit from walker trial for mobility to maximize safety with ADLs  Vision Ability to See in Adequate Light: 0 Adequate Patient Visual Report: No change from baseline Vision Assessment?: No apparent visual deficits     Perception     Praxis      Pertinent Vitals/Pain Pain  Assessment Pain Assessment: Faces Faces Pain Scale: Hurts little more Pain Location: headache Pain Descriptors / Indicators: Headache Pain Intervention(s): Monitored during session     Hand Dominance Right   Extremity/Trunk Assessment Upper Extremity Assessment Upper Extremity Assessment: Generalized weakness   Lower Extremity Assessment Lower Extremity Assessment: Defer to PT evaluation   Cervical / Trunk Assessment Cervical / Trunk Assessment: Normal   Communication Communication Communication: No difficulties   Cognition Arousal/Alertness: Awake/alert, Lethargic Behavior During Therapy: Flat affect Overall Cognitive Status: No family/caregiver present to determine baseline cognitive functioning                                 General Comments: follows directions, somewhat groggy-appearing with flat affect so difficult to assess cognition fully     General Comments  BP WFL pre and post session    Exercises     Shoulder Instructions      Home Living Family/patient expects to be discharged to:: Other (Comment)                                 Additional Comments: lives in camper on friend's property with approx 3 steps to enter      Prior Functioning/Environment Prior Level of Function : Needs assist             Mobility Comments: Was using cane for mobility, MD gave pt a prescription for Rollator but pt hasnt gotten it yet ADLs Comments: Friend assists with transportation, reports MOD I for ADLs. on disability        OT Problem List: Decreased strength;Decreased activity tolerance;Impaired balance (sitting and/or standing);Decreased cognition;Decreased safety awareness;Decreased knowledge of use of DME or AE      OT Treatment/Interventions: Self-care/ADL training;Therapeutic exercise;DME and/or AE instruction;Therapeutic activities;Patient/family education    OT Goals(Current goals can be found in the care plan section) Acute  Rehab OT Goals Patient Stated Goal: get some pain meds OT Goal Formulation: With patient Time For Goal Achievement: 10/15/22 Potential to Achieve Goals: Good ADL Goals Pt Will Perform Lower Body Bathing: with modified independence;sit to/from stand Pt Will Perform Lower Body Dressing: with modified independence;sit to/from stand Pt Will Transfer to Toilet: with modified independence;ambulating  OT Frequency: Min 2X/week    Co-evaluation              AM-PAC OT "6 Clicks" Daily Activity     Outcome Measure Help from another person eating meals?: None Help from another person taking care of personal grooming?: A Little Help from another person toileting, which includes using toliet, bedpan, or urinal?: A Little Help from another person bathing (including washing, rinsing, drying)?: A Lot Help from another person to put on and taking off regular upper body clothing?: A Little Help from another person to put on and taking off regular lower body clothing?: A Lot 6 Click Score: 17   End of Session Nurse Communication: Mobility status  Activity Tolerance: Patient limited by fatigue;Patient limited by lethargy Patient left: in bed;with call bell/phone within reach  OT Visit Diagnosis: Unsteadiness on feet (R26.81);Other abnormalities of gait and mobility (R26.89);Muscle weakness (generalized) (M62.81)  Time: 3943-2003 OT Time Calculation (min): 16 min Charges:  OT General Charges $OT Visit: 1 Visit OT Evaluation $OT Eval Low Complexity: 1 Low  Malachy Chamber, OTR/L Acute Rehab Services Office: [phone]   Layla Maw 10/01/2022, 1:01 PM

## 2022-10-01 NOTE — Evaluation (Signed)
Physical Therapy Evaluation Patient Details Name: Ethan Wells MRN: [id number] DOB: 04-11-1971 Today's Date: 10/01/2022  History of Present Illness  Pt is a 51 y/o male who presented with acute on chronic rectal bleeding. Admitted for further colitis workup. PMH: CAD, cardiac arrest, etoh abuse, cocaine abuse, HTN, migraines  Clinical Impression   Pt admitted with above diagnosis. Lives at home alone, in a camper, single-level home with 3 narrow steps to enter; Prior to admission, pt was able to overall manage independently, using a cane, but has a rolaltor on order that needs to be picked up; friends help with rides; Presents to PT with generalized weakness, balance deficits, incr risk of falls; min assist to get up to edge of stretcher, min assist to stand and take small steps; tending to lean posteriorly, and sat back to stretcher without warning x2;  Pt currently with functional limitations due to the deficits listed below (see PT Problem List). Pt will benefit from skilled PT to increase their independence and safety with mobility to allow discharge to the venue listed below.          Recommendations for follow up therapy are one component of a multi-disciplinary discharge planning process, led by the attending physician.  Recommendations may be updated based on patient status, additional functional criteria and insurance authorization.  Follow Up Recommendations Skilled nursing-short term rehab (<3 hours/day) (depending on progress; may progress well enough to get home) Can patient physically be transported by private vehicle: Yes    Assistance Recommended at Discharge Intermittent Supervision/Assistance  Patient can return home with the following  A little help with walking and/or transfers    Equipment Recommendations Rollator (4 wheels)  Recommendations for Other Services       Functional Status Assessment Patient has had a recent decline in their functional status and  demonstrates the ability to make significant improvements in function in a reasonable and predictable amount of time.     Precautions / Restrictions Precautions Precautions: Fall      Mobility  Bed Mobility Overal bed mobility: Needs Assistance Bed Mobility: Supine to Sit, Sit to Supine     Supine to sit: Min assist, HOB elevated Sit to supine: Min guard   General bed mobility comments: handheld assist to lift trunk from stretcher; uses momentum to get LEs back on stretcher    Transfers Overall transfer level: Needs assistance Equipment used: Rolling walker (2 wheels) Transfers: Sit to/from Stand Sit to Stand: Min assist           General transfer comment: Assist to steady and cues for safe hand placement; drifts into posterior lean at initial stand; sat back down to strethcer without warning the firs 2 standing attempts    Ambulation/Gait Ambulation/Gait assistance: Min assist Gait Distance (Feet):  (sidesteps towards head of stretcher) Assistive device: Rolling walker (2 wheels) Gait Pattern/deviations: Shuffle       General Gait Details: Needed directional cueing for sidesteps  Stairs            Wheelchair Mobility    Modified Rankin (Stroke Patients Only)       Balance     Sitting balance-Leahy Scale: Fair       Standing balance-Leahy Scale: Poor                               Pertinent Vitals/Pain Pain Assessment Pain Assessment: Faces Faces Pain Scale: Hurts little more Pain Location: headache  Pain Descriptors / Indicators: Headache Pain Intervention(s): Monitored during session, Limited activity within patient's tolerance    Home Living Family/patient expects to be discharged to:: Other (Comment)                   Additional Comments: lives in camper on friend's property with approx 3 steps to enter    Prior Function Prior Level of Function : Needs assist             Mobility Comments: Was using cane for  mobility, MD gave pt a prescription for Rollator but pt hasnt gotten it yet ADLs Comments: Friend assists with transportation, reports MOD I for ADLs. on disability     Hand Dominance   Dominant Hand: Right    Extremity/Trunk Assessment   Upper Extremity Assessment Upper Extremity Assessment: Defer to OT evaluation    Lower Extremity Assessment Lower Extremity Assessment: Generalized weakness       Communication   Communication: No difficulties  Cognition Arousal/Alertness: Awake/alert, Lethargic Behavior During Therapy: Flat affect Overall Cognitive Status: No family/caregiver present to determine baseline cognitive functioning                                 General Comments: follows directions, somewhat groggy-appearing with flat affect so difficult to assess cognition full; needs reminders to fully complete tasks        General Comments General comments (skin integrity, edema, etc.): BP WFL pre and post session    Exercises     Assessment/Plan    PT Assessment Patient needs continued PT services  PT Problem List Decreased strength;Decreased activity tolerance;Decreased balance;Decreased mobility;Decreased coordination;Decreased cognition;Decreased knowledge of use of DME;Decreased safety awareness;Decreased knowledge of precautions       PT Treatment Interventions DME instruction;Gait training;Stair training;Functional mobility training;Therapeutic activities;Therapeutic exercise;Balance training;Neuromuscular re-education;Cognitive remediation;Patient/family education    PT Goals (Current goals can be found in the Care Plan section)  Acute Rehab PT Goals Patient Stated Goal: did not state PT Goal Formulation: Patient unable to participate in goal setting Time For Goal Achievement: 10/15/22 Potential to Achieve Goals: Good    Frequency Min 2X/week     Co-evaluation               AM-PAC PT "6 Clicks" Mobility  Outcome Measure Help  needed turning from your back to your side while in a flat bed without using bedrails?: A Little Help needed moving from lying on your back to sitting on the side of a flat bed without using bedrails?: A Little Help needed moving to and from a bed to a chair (including a wheelchair)?: A Little Help needed standing up from a chair using your arms (e.g., wheelchair or bedside chair)?: A Lot Help needed to walk in hospital room?: A Lot Help needed climbing 3-5 steps with a railing? : A Lot 6 Click Score: 15    End of Session   Activity Tolerance: Patient limited by fatigue (and drowsiness) Patient left: in bed;with call bell/phone within reach;Other (comment) (stretcher, rails up) Nurse Communication: Mobility status PT Visit Diagnosis: Unsteadiness on feet (R26.81);Other abnormalities of gait and mobility (R26.89)    Time: 1446-1500 PT Time Calculation (min) (ACUTE ONLY): 14 min   Charges:   PT Evaluation $PT Eval Low Complexity: [place], PT  Acute Rehabilitation Services Office [phone]'   Colletta [place] 10/01/2022, 3:22  PM

## 2022-10-01 NOTE — Progress Notes (Signed)
Progress Note  [name] CBJ:[id number] DOB: 26-Jun-1971  DOA: 09/30/2022  DOS: 10/01/2022    Brief hospital course: LAVEL RIEMAN is a 51 y.o. male with a history of CAD, HFrEF, cocaine and alcohol abuse, chronic hematochezia, portal gastropathy who presented to the ED 10/16 with chest pain and rectal bleeding found to have worsened anemia, CT findings suggestive of colitis, and mildly elevated troponin. Plan is for flexible sigmoidoscopy to investigate bleeding, stress test for ischemic heart disease risk stratification, and antibiotics empirically for colitis.   Assessment and Plan: Colitis: LA wnl, no abdominal pain despite stenoses at celiac and SMA. CT findings could be explained by hypoalbuminemia, portal colopathy/congestion as colitis symptoms are minimal/absent.  - Work up further as below - Continue CTX, flagyl for now  GI bleeding: Acute on chronic hematochezia, suspected to be related to colitis, though internal hemorrhoid bleeding also possible.  - PPI, diet per GI.  - Flex sig per GI 10/18.   Acute on chronic blood loss anemia: Hgb 8.3, down from 10.3 10 days ago.  - Monitor H/H serially, transfuse prn < 8 and bleeding or < 7g/dl.   CAD s/p PCI 2012, VF arrest 2012, HFrecEF, chest pain, modest troponin elevation most consistent with demand myocardial ischemia:  - Cardiology consulted, adding diltiazem for antianginal effect, stress test ordered 10/18.   folic acid deficiency:  - Supplement  HLD: LDL 87.  - Continue rosuvastatin '40mg'$   Cocaine use: +UDS - Cessation counseling provided.   EtOH abuse:  - CIWA  Hyponatremia: Worsened.   Alcoholic fatty liver disease, hepatosplenomegaly noted on CT. Negative recent viral titers. LFTs appear to be at their abnormal baseline.  QT prolongation:  - Avoid provocative medications  Hypokalemia: Resolved - Monitor  Hypophosphatemia:  - Supplement  Hypomagnesemia:  - Supplement  Tobacco use:   - Cessation counseling  Obesity: Estimated body mass index is 31.12 kg/m as calculated from the following:   Height as of this encounter: '5\' 5"'$  (1.651 m).   Weight as of this encounter: 84.8 kg.  Subjective: Pain everywhere he says. Includes his head globally, which is chronic, chest in the middle, worse with exertion. Some shortness of breath, all extremities hurt, last BM was bloody and was overnight.  Objective: Vitals:   10/01/22 1343 10/01/22 1615 10/01/22 1621 10/01/22 1726  BP: (!) 126/98 118/72  111/70  Pulse: (!) 102 86    Resp:  18    Temp:   97.7 F (36.5 C)   TempSrc:      SpO2:  98%    Weight:      Height:       Gen: Chronically ill-appearing 51 y.o. male in no distress Pulm: Nonlabored breathing room air. Clear CV: Regular rate and rhythm. No murmur, rub, or gallop. No JVD, no dependent edema. GI: Abdomen soft, not very tender, non-distended, with normoactive bowel sounds. +hepatomegaly. No fluid wave Ext: Warm, no deformities Skin: No jaundice, rashes, lesions or ulcers on visualized skin. Neuro: Alert and oriented. No focal neurological deficits. Psych: Judgement and insight appear fair. Mood euthymic & affect congruent. Behavior is appropriate.    Data Personally reviewed: CBC: Recent Labs  Lab 09/30/22 1155 09/30/22 2225 10/01/22 0137 10/01/22 1205  WBC 7.4 6.8 6.1 5.2  NEUTROABS  --  4.8  --   --   HGB 9.8* 8.2* 8.3* 8.4*  HCT 30.0* 24.7* 25.2* 26.7*  MCV 94.3 93.6 94.7 96.4  PLT 221 175 154 151   Basic  Metabolic Panel: Recent Labs  Lab 09/30/22 1155 09/30/22 2225 10/01/22 0137  NA 137 133* 129*  K 3.3* 3.6 3.5  CL 103 105 101  CO2 24 22 20*  GLUCOSE 128* 91 116*  BUN <5* <5* <5*  CREATININE 0.62 0.61 0.72  CALCIUM 8.1* 7.8* 7.5*  MG  --  1.6* 1.6*  PHOS  --  2.1* 1.8*   GFR: Estimated Creatinine Clearance: 110.6 mL/min (by C-G formula based on SCr of 0.72 mg/dL). Liver Function Tests: Recent Labs  Lab 09/30/22 1155  09/30/22 2225 10/01/22 0137  AST 103* 91* 90*  ALT 34 31 29  ALKPHOS 259* 249* 237*  BILITOT 1.4* 1.8* 1.9*  PROT 7.7 6.8 6.5  ALBUMIN 2.8* 2.4* 2.3*   Recent Labs  Lab 09/30/22 1155  LIPASE 40   Recent Labs  Lab 09/30/22 2225  AMMONIA 52*   Coagulation Profile: Recent Labs  Lab 09/30/22 2225  INR 1.2   Cardiac Enzymes: Recent Labs  Lab 09/30/22 2225  CKTOTAL 97   BNP (last 3 results) No results for input(s): "PROBNP" in the last 8760 hours. HbA1C: Recent Labs    10/01/22 0137  HGBA1C 4.7*   CBG: Recent Labs  Lab 10/01/22 0409 10/01/22 0758 10/01/22 1158 10/01/22 1559 10/01/22 1624  GLUCAP 90 99 112* 125* 118*   Lipid Profile: Recent Labs    09/30/22 2225  CHOL 120  HDL 20*  LDLCALC 87  TRIG 64  CHOLHDL 6.0   Thyroid Function Tests: Recent Labs    09/30/22 2225  TSH 1.197   Anemia Panel: Recent Labs    09/30/22 2225  VITAMINB12 783  FOLATE 3.3*  FERRITIN 46  TIBC 342  IRON 66  RETICCTPCT 1.3   Urine analysis:    Component Value Date/Time   COLORURINE STRAW (A) 09/30/2022 2225   APPEARANCEUR CLEAR 09/30/2022 2225   APPEARANCEUR Clear 03/16/2012 1315   LABSPEC 1.012 09/30/2022 2225   LABSPEC 1.013 03/16/2012 1315   PHURINE 9.0 (H) 09/30/2022 2225   GLUCOSEU NEGATIVE 09/30/2022 2225   GLUCOSEU 50 mg/dL 03/16/2012 1315   HGBUR NEGATIVE 09/30/2022 [place] 09/30/2022 2225   BILIRUBINUR Negative 03/16/2012 1315   KETONESUR NEGATIVE 09/30/2022 2225   PROTEINUR NEGATIVE 09/30/2022 2225   NITRITE NEGATIVE 09/30/2022 2225   LEUKOCYTESUR NEGATIVE 09/30/2022 2225   LEUKOCYTESUR Negative 03/16/2012 1315   Recent Results (from the past 240 hour(s))  Resp Panel by RT-PCR (Flu A&B, Covid)     Status: None   Collection Time: 09/30/22  6:29 PM   Specimen: Nasal Swab  Result Value Ref Range Status   SARS Coronavirus 2 by RT PCR NEGATIVE NEGATIVE Final    Comment: (NOTE) SARS-CoV-2 target nucleic acids are NOT  DETECTED.  The SARS-CoV-2 RNA is generally detectable in upper respiratory specimens during the acute phase of infection. The lowest concentration of SARS-CoV-2 viral copies this assay can detect is 138 copies/mL. A negative result does not preclude SARS-Cov-2 infection and should not be used as the sole basis for treatment or other patient management decisions. A negative result may occur with  improper specimen collection/handling, submission of specimen other than nasopharyngeal swab, presence of viral mutation(s) within the areas targeted by this assay, and inadequate number of viral copies(<138 copies/mL). A negative result must be combined with clinical observations, patient history, and epidemiological information. The expected result is Negative.  Fact Sheet for Patients:  EntrepreneurPulse.com.au  Fact Sheet for Healthcare Providers:  IncredibleEmployment.be  This test is no  t yet approved or cleared by the [place] and  has been authorized for detection and/or diagnosis of SARS-CoV-2 by FDA under an Emergency Use Authorization (EUA). This EUA will remain  in effect (meaning this test can be used) for the duration of the COVID-19 declaration under Section 564(b)(1) of the Act, 21 U.S.C.section 360bbb-3(b)(1), unless the authorization is terminated  or revoked sooner.       Influenza A by PCR NEGATIVE NEGATIVE Final   Influenza B by PCR NEGATIVE NEGATIVE Final    Comment: (NOTE) The Xpert Xpress SARS-CoV-2/FLU/RSV plus assay is intended as an aid in the diagnosis of influenza from Nasopharyngeal swab specimens and should not be used as a sole basis for treatment. Nasal washings and aspirates are unacceptable for Xpert Xpress SARS-CoV-2/FLU/RSV testing.  Fact Sheet for Patients: EntrepreneurPulse.com.au  Fact Sheet for Healthcare Providers: IncredibleEmployment.be  This test is not yet  approved or cleared by the [place] FDA and has been authorized for detection and/or diagnosis of SARS-CoV-2 by FDA under an Emergency Use Authorization (EUA). This EUA will remain in effect (meaning this test can be used) for the duration of the COVID-19 declaration under Section 564(b)(1) of the Act, 21 U.S.C. section 360bbb-3(b)(1), unless the authorization is terminated or revoked.  Performed at [facility], [place] [address] [place], [place] [address]      ECHOCARDIOGRAM COMPLETE  Result Date: 10/01/2022    ECHOCARDIOGRAM REPORT   Patient Name:   Ethan Wells Date of Exam: 10/01/2022 Medical Rec #:  [id number]           Height:       65.0 in Accession #:    [id number]          Weight:       187.0 lb Date of Birth:  [birth year]           BSA:          1.922 m Patient Age:    5 years            BP:           124/64 mmHg Patient Gender: M                   HR:           98 bpm. Exam Location:  Inpatient Procedure: 2D Echo, Cardiac Doppler, Color Doppler and Intracardiac            Opacification Agent Indications:    R07.9* Chest pain, unspecified. Elevated troponin  History:        Patient has prior history of Echocardiogram examinations, most                 recent 12/20/2020. Previous Myocardial Infarction; Risk                 Factors:Current Smoker and Dyslipidemia. Cocaine use. ETOH.  Sonographer:    Roseanna Rainbow RDCS Referring Phys: [place]  Sonographer Comments: Technically difficult study due to poor echo windows. IMPRESSIONS  1. Left ventricular ejection fraction, by estimation, is 60 to 65%. Left ventricular ejection fraction by 2D MOD biplane is 64.0 %. The left ventricle has normal function. The left ventricle has no regional wall motion abnormalities. The left ventricular internal cavity size was mildly dilated. Left ventricular diastolic parameters are consistent with Grade I diastolic dysfunction (impaired relaxation).  2. Right ventricular systolic function  is normal. The right ventricular size is normal.  3. Left atrial size was moderately dilated.  4. The mitral valve is grossly normal. Trivial mitral valve regurgitation.  5. The aortic valve is tricuspid. Aortic valve regurgitation is not visualized. Comparison(s): Changes from prior study are noted. 12/20/2020: LVEF 24-09%, normal diastolic function. FINDINGS  Left Ventricle: Left ventricular ejection fraction, by estimation, is 60 to 65%. Left ventricular ejection fraction by 2D MOD biplane is 64.0 %. The left ventricle has normal function. The left ventricle has no regional wall motion abnormalities. Definity contrast agent was given IV to delineate the left ventricular endocardial borders. The left ventricular internal cavity size was mildly dilated. There is no left ventricular hypertrophy. Left ventricular diastolic parameters are consistent with Grade I diastolic dysfunction (impaired relaxation). Indeterminate filling pressures. Right Ventricle: The right ventricular size is normal. No increase in right ventricular wall thickness. Right ventricular systolic function is normal. Left Atrium: Left atrial size was moderately dilated. Right Atrium: Right atrial size was normal in size. Pericardium: There is no evidence of pericardial effusion. Mitral Valve: The mitral valve is grossly normal. Trivial mitral valve regurgitation. Tricuspid Valve: The tricuspid valve is grossly normal. Tricuspid valve regurgitation is trivial. Aortic Valve: The aortic valve is tricuspid. Aortic valve regurgitation is not visualized. Pulmonic Valve: The pulmonic valve was normal in structure. Pulmonic valve regurgitation is not visualized. Aorta: The aortic root and ascending aorta are structurally normal, with no evidence of dilitation. IAS/Shunts: No atrial level shunt detected by color flow Doppler.  LEFT VENTRICLE PLAX 2D                        Biplane EF (MOD) LVIDd:         5.55 cm         LV Biplane EF:   Left LVIDs:          4.05 cm                          ventricular LV PW:         1.00 cm                          ejection LV IVS:        1.00 cm                          fraction by LVOT diam:     2.70 cm                          2D MOD LV SV:         121                              biplane is LV SV Index:   63                               64.0 %. LVOT Area:     5.73 cm                                Diastology  LV e' medial:    4.35 cm/s LV Volumes (MOD)               LV E/e' medial:  23.2 LV vol d, MOD    126.0 ml      LV e' lateral:   9.90 cm/s A2C:                           LV E/e' lateral: 10.2 LV vol d, MOD    141.0 ml A4C: LV vol s, MOD    42.1 ml A2C: LV vol s, MOD    56.1 ml A4C: LV SV MOD A2C:   83.9 ml LV SV MOD A4C:   141.0 ml LV SV MOD BP:    86.5 ml RIGHT VENTRICLE             IVC RV S prime:     11.40 cm/s  IVC diam: 1.90 cm TAPSE (M-mode): 1.9 cm LEFT ATRIUM             Index        RIGHT ATRIUM           Index LA diam:        3.80 cm 1.98 cm/m   RA Area:     16.30 cm LA Vol (A2C):   33.4 ml 17.37 ml/m  RA Volume:   36.20 ml  18.83 ml/m LA Vol (A4C):   76.6 ml 39.85 ml/m LA Biplane Vol: 50.6 ml 26.32 ml/m  AORTIC VALVE LVOT Vmax:   121.00 cm/s LVOT Vmean:  82.300 cm/s LVOT VTI:    0.211 m  AORTA Ao Root diam: 3.50 cm Ao Asc diam:  3.30 cm MITRAL VALVE MV Area (PHT): 5.32 cm     SHUNTS MV Decel Time: 143 msec     Systemic VTI:  0.21 m MV E velocity: 101.00 cm/s  Systemic Diam: 2.70 cm MV A velocity: 118.00 cm/s MV E/A ratio:  0.86 Lyman Bishop MD Electronically signed by Lyman Bishop MD Signature Date/Time: 10/01/2022/1:03:26 PM    Final    CT Angio Chest/Abd/Pel for Dissection W and/or Wo Contrast  Result Date: 09/30/2022 CLINICAL DATA:  Acute aortic syndrome suspected. Headache and rectal bleeding. Chest pain. EXAM: CT ANGIOGRAPHY CHEST, ABDOMEN AND PELVIS TECHNIQUE: Non-contrast CT of the chest was initially obtained. Multidetector CT imaging through the chest, abdomen  and pelvis was performed using the standard protocol during bolus administration of intravenous contrast. Multiplanar reconstructed images and MIPs were obtained and reviewed to evaluate the vascular anatomy. RADIATION DOSE REDUCTION: This exam was performed according to the departmental dose-optimization program which includes automated exposure control, adjustment of the mA and/or kV according to patient size and/or use of iterative reconstruction technique. CONTRAST:  78m OMNIPAQUE IOHEXOL 350 MG/ML SOLN COMPARISON:  CT angio chest, CT abdomen and pelvis 08/07/2021. FINDINGS: CTA CHEST FINDINGS Cardiovascular: Preferential opacification of the thoracic aorta. No evidence of thoracic aortic aneurysm or dissection. Normal heart size. No pericardial effusion. Coronary artery calcifications are present. There is enlargement of the main pulmonary artery which can be seen with pulmonary artery hypertension. There is no central pulmonary embolism. Mediastinum/Nodes: No enlarged mediastinal, hilar, or axillary lymph nodes. Thyroid gland, trachea, and esophagus demonstrate no significant findings. Lungs/Pleura: There is minimal linear scarring in the right lung apex. The lungs are otherwise clear. There is no pleural effusion or pneumothorax. There are minimal secretions in the left mainstem bronchus. Musculoskeletal: No chest wall abnormality.  No acute or significant osseous findings. Review of the MIP images confirms the above findings. CTA ABDOMEN AND PELVIS FINDINGS VASCULAR Aorta: Normal caliber aorta without aneurysm, dissection, vasculitis or significant stenosis. Mild calcified atherosclerotic disease is seen throughout the aorta. Celiac: Severe focal stenosis at the origin of the celiac artery. Celiac artery and branches are otherwise within normal limits. SMA: Severe focal stenosis origin of the superior mesenteric artery. Superior mesenteric artery otherwise widely patent and within normal limits. Renals: Both  renal arteries are patent without evidence of aneurysm, dissection, vasculitis, fibromuscular dysplasia or significant stenosis. IMA: Patent without evidence of aneurysm, dissection, vasculitis or significant stenosis. Inflow: Patent without evidence of aneurysm, dissection, vasculitis or significant stenosis. Veins: No obvious venous abnormality within the limitations of this arterial phase study. Review of the MIP images confirms the above findings. NON-VASCULAR Hepatobiliary: Liver is mildly enlarged. No focal liver lesions are seen. No gallstones are identified. No biliary ductal dilatation. Pancreas: Unremarkable. No pancreatic ductal dilatation or surrounding inflammatory changes. Spleen: Mildly enlarged, unchanged. Adrenals/Urinary Tract: Adrenal glands are unremarkable. Kidneys are normal, without renal calculi, focal lesion, or hydronephrosis. Bladder is unremarkable. Stomach/Bowel: There is mild diffuse colonic wall thickening to the level of the rectum. There is mild surrounding inflammation involving the rectum and distal sigmoid colon. Appendix is not definitely seen. Small bowel and stomach are within normal limits. Lymphatic: No enlarged lymph nodes are identified. Reproductive: Prostate is unremarkable. Other: Small fat containing left inguinal hernia. There is no ascites. There is mild fat stranding and haziness in the retroperitoneum at the level of the inferior aorta and IVC. This is new from prior. Musculoskeletal: No acute or significant osseous findings. Review of the MIP images confirms the above findings. IMPRESSION: 1. No evidence for aortic dissection or aneurysm. 2. Diffuse colonic wall thickening with mild surrounding inflammation compatible with nonspecific colitis. 3. New mild fat stranding and haziness in the retroperitoneum at the level of the inferior aorta and IVC. Findings may be related to infectious or inflammatory process. 4. Severe focal stenosis at the origin of the celiac  artery and superior mesenteric artery. 5. Stable hepatosplenomegaly. Electronically Signed   By: Ronney Asters M.D.   On: 09/30/2022 20:14   CT Head Wo Contrast  Result Date: 09/30/2022 CLINICAL DATA:  Headache EXAM: CT HEAD WITHOUT CONTRAST TECHNIQUE: Contiguous axial images were obtained from the base of the skull through the vertex without intravenous contrast. RADIATION DOSE REDUCTION: This exam was performed according to the departmental dose-optimization program which includes automated exposure control, adjustment of the mA and/or kV according to patient size and/or use of iterative reconstruction technique. COMPARISON:  08/19/2022 FINDINGS: Brain: No evidence of acute infarction, hemorrhage, hydrocephalus, extra-axial collection or mass lesion/mass effect. Mild subcortical white matter and periventricular small vessel ischemic changes. Vascular: Mild intracranial atherosclerosis. Skull: Normal. Negative for fracture or focal lesion. Sinuses/Orbits: The visualized paranasal sinuses are essentially clear. The mastoid air cells are unopacified. Other: None. IMPRESSION: No acute intracranial abnormality. Mild small vessel ischemic changes. Electronically Signed   By: Julian Hy M.D.   On: 09/30/2022 20:06   DG Chest 2 View  Result Date: 09/30/2022 CLINICAL DATA:  Chest pain, shortness of breath EXAM: CHEST - 2 VIEW COMPARISON:  Previous studies including the examination of 08/19/2022 FINDINGS: Transverse diameter of heart is increased. Increase in AP diameter of chest suggests COPD. There are no signs of pulmonary edema or focal pulmonary consolidation. There is no pleural effusion or pneumothorax. IMPRESSION: Cardiomegaly. There are  no signs of pulmonary edema or focal pulmonary consolidation. Electronically Signed   By: Elmer Picker M.D.   On: 09/30/2022 12:21    Family Communication: None at bedside  Disposition: Status is: Inpatient Remains inpatient appropriate because: GI  bleeding requiring monitoring, diagnostic 10/18 Planned Discharge Destination:  TBD; SNF currently recommended  Patrecia Pour, MD 10/01/2022 5:49 PM Page by [email or web address]

## 2022-10-01 NOTE — TOC Initial Note (Signed)
Transition of Care [facility]) - Initial/Assessment Note    Patient Details  Name: Ethan Wells MRN: [id number] Date of Birth: 06-02-1971  [facility] CM/SW Contact:    Archie Endo, LCSW Phone Number: 10/01/2022, 2:20 PM  Clinical Narrative:                 CSW spoke with patient at bedside to complete CAGE AID assessment and substance abuse counseling. Patient confirms active alcohol and cocaine abuse. Patient reports he lives with his cousin [place] in [place]. Patient reports he is unemployed but receives disability income. Patient reports he has really bad anxiety and PTSD but states both are untreated. Patient reports he has "had several heart attacks, and even died" when asked about his medical history. Patient reports he has a cardiac stent in place. Patient also reports he has poor eyesight and was recently seen at [facility] for a new eye glass prescription. Patient reports he has his own transportation and has no issue completing his ADL's independently. CSW and patient thoroughly discussed his substance use and CSW encouraged him to cease all illegal substance use including driving while intoxicated. Patient requested something to drink - CSW obtained drink from nourishment room. CSW encouraged patient to reach out if new needs arise - he is agreeable.  Expected Discharge Plan: Home/Self Care Barriers to Discharge: Continued Medical Work up   Patient Goals and CMS Choice Patient states their goals for this hospitalization and ongoing recovery are:: Return home      Expected Discharge Plan and Services Expected Discharge Plan: Home/Self Care       Living arrangements for the past 2 months: Apartment                                      Prior Living Arrangements/Services Living arrangements for the past 2 months: Apartment Lives with:: Relatives Patient language and need for interpreter reviewed:: Yes        Need for Family Participation  in Patient Care: Yes (Comment) Care giver support system in place?: Yes (comment)   Criminal Activity/Legal Involvement Pertinent to Current Situation/Hospitalization: No - Comment as needed  Activities of Daily Living      Permission Sought/Granted                  Emotional Assessment Appearance:: Appears older than stated age Attitude/Demeanor/Rapport: Gracious, Engaged Affect (typically observed): Accepting, Pleasant, Calm Orientation: : Oriented to Self, Oriented to Place, Oriented to  Time, Oriented to Situation Alcohol / Substance Use: Tobacco Use, Alcohol Use, Illicit Drugs Psych Involvement: No (comment)  Admission diagnosis:  Colitis [K52.9] Patient Active Problem List   Diagnosis Date Noted   Colitis 09/30/2022   Prolonged QT interval 09/30/2022   Alcohol use disorder 08/19/2022   Acute on chronic blood loss anemia 05/29/2022   GI bleed 05/28/2022   Non-intractable vomiting    Epigastric abdominal pain    Polyp of ascending colon    ABLA (acute blood loss anemia) 08/07/2021   Acute gastritis    Hyperbilirubinemia    Elevated troponin    Notalgia    NSTEMI (non-ST elevated myocardial infarction) ([place]) 12/19/2020   History of ST elevation myocardial infarction (STEMI) 12/19/2020   Alcohol use disorder, moderate, dependence ([place]) 12/19/2020   Depression with anxiety 10/05/2020   Esophageal reflux 10/05/2020   Headache 10/05/2020   Hyperlipidemia 10/05/2020   Hyponatremia  06/04/2020   Hypomagnesemia 84/11/8207   Chronic systolic CHF (congestive heart failure) ([place]) 06/04/2020   Leukocytosis 06/04/2020   Abdominal pain 06/04/2020   Hypokalemia 06/04/2020   Ischemic cardiomyopathy    Polysubstance abuse ([place])    Chest pain 07/12/2019   Abdominal pain, RUQ 05/25/2018   Chest pain, mid sternal 11/17/2012   Cocaine abuse ([place])    Marijuana abuse    TOBACCO ABUSE 02/26/2011   HYPERTENSION, BENIGN 02/26/2011   CAD (coronary artery disease) 02/26/2011    Other specified forms of chronic ischemic heart disease 02/26/2011   PCP:  Nickola Major, NP Pharmacy:   [id number] [place], [id number]: [phone]: [phone]    Social Determinants of Health (SDOH) Interventions    Readmission Risk Interventions     No data to display

## 2022-10-01 NOTE — Consult Note (Signed)
Cardiology Consultation   Patient ID: Ethan Wells MRN: [id number]; DOB: [birth year]  Admit date: 09/30/2022 Date of Consult: 10/01/2022  PCP:  Nickola Major, NP   [place] [facility] Cardiologist:  Lauree Chandler, MD        [name] is a 51 y.o. male with a hx of tobacco abuse, cocaine abuse, alcohol abuse, coronary artery disease status post stent to LAD in 2694 (complicated by v fib cardiac arrest), ischemic cardiomyopathy, fatty liver disease, diabetes mellitus (diet controlled?), Barrett's esophagus, anemia.  Patient is being seen 10/01/2022 for the evaluation of chest pain at the request of Dr. Roel Cluck.  History of Present Illness:   Mr. Piscopo for evaluation of headache, rectal bleeding, chest pain, shortness of breath.    On my exam, patient is quite sleepy having recently received both morphine and ativan. Does not have active chest pain or shortness of breath at this time. He reports that he went out to a bar with friends over the weekend and says that since Saturday he has had persistent chest pain that radiates into his back and abdomen. Chest pain is described as squeezing/tightness and is rated 6/10. Per patient, this pain began after he had a mixed drink that "tasted funny." He additionally describes chronic but intermittent chest tightness with exertion. Pain is not always relieved with nitro but per patient, he has not taken nitro in quite some time (reportedly experiences significant headaches). He also endorses symptoms of exertional dyspnea but denies orthopnea. Because pain did not get better yesterday and reminded patient of previous MI, he sought ED evaluation. Patient says that he has had a headache for the last month.  Patient also reporting chronic rectal bleeding for the last year. Primarily with wiping after bowel movements but in the past few days, this progress to a significant amount of blood in  the toilet. Patient now with both rectal and abdominal pain, denies emesis.  Patient continues to smoke, around 1 pack every 2 days. He also reports drinking 3-4 alcoholic beverages per day. Insists that his last cocaine use was at least several months ago.   Regarding cardiac medication, patient reports compliance with home lisinopril and rosuvastatin. He says that he has not taken Carvedilol in quite some time.   Cardiac history:  Patient last seen cardiology clinic on 02/20/2021 by Dr. Angelena Form.  At that time patient was without exertional chest pain.  He was continued on lisinopril, Coreg, and Crestor (appears he was not taking Coreg or Crestor as advised).  Patient was admitted to [facility] on February 06, 2011 with acute anterior wall STEMI complicated by V-fib arrest.  His LAD was stented with bare-metal stent.  At the time of this hospitalization, patient's ejection fraction was 25 to 30%.  Follow-up echocardiogram later that year showed improvement of LVEF to 45-50%.  Patient admitted to [facility] in July 2020 and June 2021 with chest pain.  In both cases pain determined to be noncardiac related.  Patient again admitted to [facility] in January 2022 with shortness of breath and back pain.  Troponins checked this admission were mildly elevated.  Echocardiogram found LVEF recovered to 55-60%.  Past Medical History:  Diagnosis Date   CAD (coronary artery disease)    a. 01/2011 Anterior STEMI/Cath/PCI: LM nl, LAD 100d (3.5x69m Vision BMS placed), LCX 254mRI 50, RCA min irregs, EF 40% w/ apical, inferoapical HK.   Cardiac arrest - ventricular fibrillation  a. In setting of STEMI 01/2011   Chronic pain    Cocaine abuse ([place])    Depression with anxiety    ETOH abuse    a. 6-12 beers/day   GERD (gastroesophageal reflux disease)    Hemorrhoids    Hyperlipidemia    Hypertension    Ischemic cardiomyopathy    a. 06/2011 Echo: EF 45-50%, No rwma   Marijuana abuse    Migraines     Tobacco abuse    a. 1/2 ppd x 26 yrs    Past Surgical History:  Procedure Laterality Date   COLONOSCOPY  08/09/2021   Procedure: COLONOSCOPY;  Surgeon: Lucilla Lame, MD;  Location[facility] ENDOSCOPY;  Service: Endoscopy;;   CORONARY ANGIOPLASTY WITH STENT PLACEMENT     ESOPHAGOGASTRODUODENOSCOPY N/A 05/30/2022   Procedure: ESOPHAGOGASTRODUODENOSCOPY (EGD);  Surgeon: Lin Landsman, MD;  Location: [facility] ENDOSCOPY;  Service: Gastroenterology;  Laterality: N/A;   ESOPHAGOGASTRODUODENOSCOPY (EGD) WITH PROPOFOL N/A 03/01/2021   Procedure: ESOPHAGOGASTRODUODENOSCOPY (EGD) WITH PROPOFOL;  Surgeon: Lin Landsman, MD;  Location: [place];  Service: Endoscopy;  Laterality: N/A;   ESOPHAGOGASTRODUODENOSCOPY (EGD) WITH PROPOFOL N/A 08/09/2021   Procedure: ESOPHAGOGASTRODUODENOSCOPY (EGD) WITH PROPOFOL;  Surgeon: Lucilla Lame, MD;  Location: [facility] ENDOSCOPY;  Service: Endoscopy;  Laterality: N/A;     Home Medications:  Prior to Admission medications   Medication Sig Start Date End Date Taking? Authorizing Provider  aspirin 81 MG chewable tablet Chew 1 tablet (81 mg total) by mouth daily. 06/01/22  Yes Sreenath, Sudheer B, MD  gabapentin (NEURONTIN) 300 MG capsule Take 1 capsule (300 mg total) by mouth 3 (three) times daily. 09/23/22  Yes Medina-Vargas, Monina C, NP  lisinopril (ZESTRIL) 5 MG tablet Take 1 tablet (5 mg total) by mouth daily. 09/23/22  Yes Medina-Vargas, Monina C, NP  pantoprazole (PROTONIX) 40 MG tablet Take 1 tablet (40 mg total) by mouth daily. 09/23/22  Yes Medina-Vargas, Monina C, NP  rosuvastatin (CRESTOR) 40 MG tablet Take 1 tablet (40 mg total) by mouth daily. 09/23/22  Yes Medina-Vargas, Monina C, NP  albuterol (PROVENTIL) (2.5 MG/3ML) 0.083% nebulizer solution Take 3 mLs (2.5 mg total) by nebulization every 6 (six) hours as needed for wheezing or shortness of breath. Patient not taking: Reported on 09/30/2022 09/23/22   Medina-Vargas, Monina C, NP  ondansetron (ZOFRAN  ODT) 4 MG disintegrating tablet Take 1 tablet (4 mg total) by mouth every 8 (eight) hours as needed for nausea or vomiting. Patient not taking: Reported on 09/30/2022 06/01/22   Sidney Ace, MD    Inpatient Medications: Scheduled Meds:  folic acid  1 mg Oral Daily   gabapentin  300 mg Oral TID   guaiFENesin  600 mg Oral BID   hydrocortisone  25 mg Rectal BID   insulin aspart  0-9 Units Subcutaneous Q4H   multivitamin with minerals  1 tablet Oral Daily   nicotine  21 mg Transdermal Daily   [START ON 10/02/2022] pantoprazole  40 mg Oral Q0600   phosphorus  500 mg Oral TID   rosuvastatin  40 mg Oral Daily   sodium chloride flush  3 mL Intravenous Q12H   thiamine  100 mg Oral Daily   Or   thiamine  100 mg Intravenous Daily   Continuous Infusions:  sodium chloride     cefTRIAXone (ROCEPHIN)  IV     metronidazole Stopped (10/01/22 1134)   PRN Meds: sodium chloride, acetaminophen **OR** acetaminophen, albuterol, HYDROcodone-acetaminophen, LORazepam **OR** LORazepam, morphine injection, nitroGLYCERIN, sodium chloride flush  Allergies:  Allergies  Allergen Reactions   Penicillins Hives   Other Rash    chives    Social History:   Social History   Socioeconomic History   Marital status: Single    Spouse name: Not on file   Number of children: Not on file   Years of education: Not on file   Highest education level: Not on file  Occupational History   Not on file  Tobacco Use   Smoking status: Every Day    Packs/day: 0.50    Years: 41.00    Total pack years: 20.50    Types: Cigarettes   Smokeless tobacco: Never   Tobacco comments:    started age 63  Vaping Use   Vaping Use: Never used  Substance and Sexual Activity   Alcohol use: Yes    Alcohol/week: 12.0 standard drinks of alcohol    Types: 12 Cans of beer per week    Comment: at least a 6 pack of beer daily and often a 12 pack   Drug use: Not Currently    Types: Cocaine, Marijuana   Sexual activity: Not  on file  Other Topics Concern   Not on file  Social History Narrative   Single, lives in [place] with his mother.  He does not routinely exercise.  He sometimes works, helping to deliver appliances to homes.   Social Determinants of Health   Financial Resource Strain: Not on file  Food Insecurity: Not on file  Transportation Needs: Not on file  Physical Activity: Not on file  Stress: Not on file  Social Connections: Not on file  Intimate Partner Violence: Not on file    Family History:    Family History  Problem Relation Age of Onset   Coronary artery disease Mother        alive   Other Other        2 sisters alive and well   Alcohol abuse Father        died @ 61   Other Paternal Grandmother        PACER     ROS:  Please see the history of present illness.   All other ROS reviewed and negative.     Physical Exam/Data:   Vitals:   10/01/22 1030 10/01/22 1100 10/01/22 1115 10/01/22 1216  BP: 116/70 116/71 122/76   Pulse: 99 96 98   Resp: (!) '21 15 16   '$ Temp:    (!) 97.4 F (36.3 C)  TempSrc:    Oral  SpO2: 93% 93% 94%   Weight:      Height:        Intake/Output Summary (Last 24 hours) at 10/01/2022 1329 Last data filed at 10/01/2022 0007 Gross per 24 hour  Intake 240 ml  Output 500 ml  Net -260 ml      09/30/2022   10:18 PM 09/23/2022    9:41 AM 08/19/2022   12:43 PM  Last 3 Weights  Weight (lbs) 187 lb 187 lb 12.8 oz 180 lb  Weight (kg) 84.823 kg 85.186 kg 81.647 kg     Body mass index is 31.12 kg/m.  General:  Disheveled appearing, in no acute distress HEENT: normal Neck: no JVD Vascular: No carotid bruits; Distal pulses 2+ bilaterally Cardiac:  normal S1, S2; tachycardic; no murmur  Lungs:  clear to auscultation bilaterally, no wheezing, rhonchi or rales  Abd: soft, nontender, hepatomegaly noted Ext: trace to 1+ edema left lower extremity. Musculoskeletal:  No deformities, BUE and  BLE strength normal and equal Skin: warm and dry  Neuro:   CNs 2-12 intact, no focal abnormalities noted Psych: Lethargic/sleepy  EKG:  The 10/16 EKG was personally reviewed and demonstrates:  sinus tachycardia. Study with baseline artifact but no obvious ischemic changes. Appears stable with previous studies. Telemetry:  Telemetry was personally reviewed and demonstrates:  sinus tachycardia  Relevant CV Studies:  10/01/2022 TTE  IMPRESSIONS     1. Left ventricular ejection fraction, by estimation, is 60 to 65%. Left  ventricular ejection fraction by 2D MOD biplane is 64.0 %. The left  ventricle has normal function. The left ventricle has no regional wall  motion abnormalities. The left  ventricular internal cavity size was mildly dilated. Left ventricular  diastolic parameters are consistent with Grade I diastolic dysfunction  (impaired relaxation).   2. Right ventricular systolic function is normal. The right ventricular  size is normal.   3. Left atrial size was moderately dilated.   4. The mitral valve is grossly normal. Trivial mitral valve  regurgitation.   5. The aortic valve is tricuspid. Aortic valve regurgitation is not  visualized.   Comparison(s): Changes from prior study are noted. 12/20/2020: LVEF 63-14%,  normal diastolic function.   FINDINGS   Left Ventricle: Left ventricular ejection fraction, by estimation, is 60  to 65%. Left ventricular ejection fraction by 2D MOD biplane is 64.0 %.  The left ventricle has normal function. The left ventricle has no regional  wall motion abnormalities.  Definity contrast agent was given IV to delineate the left ventricular  endocardial borders. The left ventricular internal cavity size was mildly  dilated. There is no left ventricular hypertrophy. Left ventricular  diastolic parameters are consistent with  Grade I diastolic dysfunction (impaired relaxation). Indeterminate filling  pressures.   Right Ventricle: The right ventricular size is normal. No increase in  right ventricular  wall thickness. Right ventricular systolic function is  normal.   Left Atrium: Left atrial size was moderately dilated.   Right Atrium: Right atrial size was normal in size.   Pericardium: There is no evidence of pericardial effusion.   Mitral Valve: The mitral valve is grossly normal. Trivial mitral valve  regurgitation.   Tricuspid Valve: The tricuspid valve is grossly normal. Tricuspid valve  regurgitation is trivial.   Aortic Valve: The aortic valve is tricuspid. Aortic valve regurgitation is  not visualized.   Pulmonic Valve: The pulmonic valve was normal in structure. Pulmonic valve  regurgitation is not visualized.   Aorta: The aortic root and ascending aorta are structurally normal, with  no evidence of dilitation.   IAS/Shunts: No atrial level shunt detected by color flow Doppler.   07/14/2019 NM Mycoar  Pharmacological myocardial perfusion imaging study with no significant  Ischemia Unable to exclude small region mild fixed defect anterior wall consistent with prior MI Grossly normal wall motion, unable to exclude mild hypokinesis anteroseptal region, EF estimated at 50% No EKG changes concerning for ischemia at peak stress or in recovery. Low risk scan  Laboratory Data:  High Sensitivity Troponin:   Recent Labs  Lab 09/30/22 1155 09/30/22 1444 09/30/22 2225 10/01/22 0136  TROPONINIHS 83* 83* 62* 64*     Chemistry Recent Labs  Lab 09/30/22 1155 09/30/22 2225 10/01/22 0137  NA 137 133* 129*  K 3.3* 3.6 3.5  CL 103 105 101  CO2 24 22 20*  GLUCOSE 128* 91 116*  BUN <5* <5* <5*  CREATININE 0.62 0.61 0.72  CALCIUM 8.1* 7.8* 7.5*  MG  --  1.6* 1.6*  GFRNONAA >60 >60 >60  ANIONGAP '10 6 8    '$ Recent Labs  Lab 09/30/22 1155 09/30/22 2225 10/01/22 0137  PROT 7.7 6.8 6.5  ALBUMIN 2.8* 2.4* 2.3*  AST 103* 91* 90*  ALT 34 31 29  ALKPHOS 259* 249* 237*  BILITOT 1.4* 1.8* 1.9*   Lipids  Recent Labs  Lab 09/30/22 2225  CHOL 120  TRIG 64  HDL  20*  LDLCALC 87  CHOLHDL 6.0    Hematology Recent Labs  Lab 09/30/22 2225 10/01/22 0137 10/01/22 1205  WBC 6.8 6.1 5.2  RBC 2.64*  2.68* 2.66* 2.77*  HGB 8.2* 8.3* 8.4*  HCT 24.7* 25.2* 26.7*  MCV 93.6 94.7 96.4  MCH 31.1 31.2 30.3  MCHC 33.2 32.9 31.5  RDW 19.7* 19.7* 19.3*  PLT 175 154 151   Thyroid  Recent Labs  Lab 09/30/22 2225  TSH 1.197    BNP Recent Labs  Lab 09/30/22 1155  BNP 187.2*    DDimer No results for input(s): "DDIMER" in the last 168 hours.   Radiology/Studies:  ECHOCARDIOGRAM COMPLETE  Result Date: 10/01/2022    ECHOCARDIOGRAM REPORT   Patient Name:   GIANNY SABINO Date of Exam: 10/01/2022 Medical Rec #:  [id number]           Height:       65.0 in Accession #:    [id number]          Weight:       187.0 lb Date of Birth:  03/16/1971           BSA:          1.922 m Patient Age:    4 years            BP:           124/64 mmHg Patient Gender: M                   HR:           98 bpm. Exam Location:  Inpatient Procedure: 2D Echo, Cardiac Doppler, Color Doppler and Intracardiac            Opacification Agent Indications:    R07.9* Chest pain, unspecified. Elevated troponin  History:        Patient has prior history of Echocardiogram examinations, most                 recent 12/20/2020. Previous Myocardial Infarction; Risk                 Factors:Current Smoker and Dyslipidemia. Cocaine use. ETOH.  Sonographer:    Roseanna Rainbow RDCS Referring Phys: [place]  Sonographer Comments: Technically difficult study due to poor echo windows. IMPRESSIONS  1. Left ventricular ejection fraction, by estimation, is 60 to 65%. Left ventricular ejection fraction by 2D MOD biplane is 64.0 %. The left ventricle has normal function. The left ventricle has no regional wall motion abnormalities. The left ventricular internal cavity size was mildly dilated. Left ventricular diastolic parameters are consistent with Grade I diastolic dysfunction (impaired relaxation).  2.  Right ventricular systolic function is normal. The right ventricular size is normal.  3. Left atrial size was moderately dilated.  4. The mitral valve is grossly normal. Trivial mitral valve regurgitation.  5. The aortic valve is tricuspid. Aortic valve regurgitation is not visualized. Comparison(s): Changes from prior study are noted. 12/20/2020: LVEF 50-53%, normal diastolic function. FINDINGS  Left Ventricle: Left ventricular ejection fraction, by estimation, is 60 to 65%. Left ventricular ejection fraction by 2D MOD biplane is 64.0 %. The left ventricle has normal function. The left ventricle has no regional wall motion abnormalities. Definity contrast agent was given IV to delineate the left ventricular endocardial borders. The left ventricular internal cavity size was mildly dilated. There is no left ventricular hypertrophy. Left ventricular diastolic parameters are consistent with Grade I diastolic dysfunction (impaired relaxation). Indeterminate filling pressures. Right Ventricle: The right ventricular size is normal. No increase in right ventricular wall thickness. Right ventricular systolic function is normal. Left Atrium: Left atrial size was moderately dilated. Right Atrium: Right atrial size was normal in size. Pericardium: There is no evidence of pericardial effusion. Mitral Valve: The mitral valve is grossly normal. Trivial mitral valve regurgitation. Tricuspid Valve: The tricuspid valve is grossly normal. Tricuspid valve regurgitation is trivial. Aortic Valve: The aortic valve is tricuspid. Aortic valve regurgitation is not visualized. Pulmonic Valve: The pulmonic valve was normal in structure. Pulmonic valve regurgitation is not visualized. Aorta: The aortic root and ascending aorta are structurally normal, with no evidence of dilitation. IAS/Shunts: No atrial level shunt detected by color flow Doppler.  LEFT VENTRICLE PLAX 2D                        Biplane EF (MOD) LVIDd:         5.55 cm         LV  Biplane EF:   Left LVIDs:         4.05 cm                          ventricular LV PW:         1.00 cm                          ejection LV IVS:        1.00 cm                          fraction by LVOT diam:     2.70 cm                          2D MOD LV SV:         121                              biplane is LV SV Index:   63                               64.0 %. LVOT Area:     5.73 cm                                Diastology                                LV e' medial:    4.35 cm/s LV Volumes (MOD)               LV E/e' medial:  23.2 LV vol d, MOD    126.0  ml      LV e' lateral:   9.90 cm/s A2C:                           LV E/e' lateral: 10.2 LV vol d, MOD    141.0 ml A4C: LV vol s, MOD    42.1 ml A2C: LV vol s, MOD    56.1 ml A4C: LV SV MOD A2C:   83.9 ml LV SV MOD A4C:   141.0 ml LV SV MOD BP:    86.5 ml RIGHT VENTRICLE             IVC RV S prime:     11.40 cm/s  IVC diam: 1.90 cm TAPSE (M-mode): 1.9 cm LEFT ATRIUM             Index        RIGHT ATRIUM           Index LA diam:        3.80 cm 1.98 cm/m   RA Area:     16.30 cm LA Vol (A2C):   33.4 ml 17.37 ml/m  RA Volume:   36.20 ml  18.83 ml/m LA Vol (A4C):   76.6 ml 39.85 ml/m LA Biplane Vol: 50.6 ml 26.32 ml/m  AORTIC VALVE LVOT Vmax:   121.00 cm/s LVOT Vmean:  82.300 cm/s LVOT VTI:    0.211 m  AORTA Ao Root diam: 3.50 cm Ao Asc diam:  3.30 cm MITRAL VALVE MV Area (PHT): 5.32 cm     SHUNTS MV Decel Time: 143 msec     Systemic VTI:  0.21 m MV E velocity: 101.00 cm/s  Systemic Diam: 2.70 cm MV A velocity: 118.00 cm/s MV E/A ratio:  0.86 Lyman Bishop MD Electronically signed by Lyman Bishop MD Signature Date/Time: 10/01/2022/1:03:26 PM    Final    CT Angio Chest/Abd/Pel for Dissection W and/or Wo Contrast  Result Date: 09/30/2022 CLINICAL DATA:  Acute aortic syndrome suspected. Headache and rectal bleeding. Chest pain. EXAM: CT ANGIOGRAPHY CHEST, ABDOMEN AND PELVIS TECHNIQUE: Non-contrast CT of the chest was initially obtained. Multidetector CT  imaging through the chest, abdomen and pelvis was performed using the standard protocol during bolus administration of intravenous contrast. Multiplanar reconstructed images and MIPs were obtained and reviewed to evaluate the vascular anatomy. RADIATION DOSE REDUCTION: This exam was performed according to the departmental dose-optimization program which includes automated exposure control, adjustment of the mA and/or kV according to patient size and/or use of iterative reconstruction technique. CONTRAST:  48m OMNIPAQUE IOHEXOL 350 MG/ML SOLN COMPARISON:  CT angio chest, CT abdomen and pelvis 08/07/2021. FINDINGS: CTA CHEST FINDINGS Cardiovascular: Preferential opacification of the thoracic aorta. No evidence of thoracic aortic aneurysm or dissection. Normal heart size. No pericardial effusion. Coronary artery calcifications are present. There is enlargement of the main pulmonary artery which can be seen with pulmonary artery hypertension. There is no central pulmonary embolism. Mediastinum/Nodes: No enlarged mediastinal, hilar, or axillary lymph nodes. Thyroid gland, trachea, and esophagus demonstrate no significant findings. Lungs/Pleura: There is minimal linear scarring in the right lung apex. The lungs are otherwise clear. There is no pleural effusion or pneumothorax. There are minimal secretions in the left mainstem bronchus. Musculoskeletal: No chest wall abnormality. No acute or significant osseous findings. Review of the MIP images confirms the above findings. CTA ABDOMEN AND PELVIS FINDINGS VASCULAR Aorta: Normal caliber aorta without aneurysm, dissection, vasculitis or significant stenosis. Mild calcified atherosclerotic disease is seen  throughout the aorta. Celiac: Severe focal stenosis at the origin of the celiac artery. Celiac artery and branches are otherwise within normal limits. SMA: Severe focal stenosis origin of the superior mesenteric artery. Superior mesenteric artery otherwise widely patent and  within normal limits. Renals: Both renal arteries are patent without evidence of aneurysm, dissection, vasculitis, fibromuscular dysplasia or significant stenosis. IMA: Patent without evidence of aneurysm, dissection, vasculitis or significant stenosis. Inflow: Patent without evidence of aneurysm, dissection, vasculitis or significant stenosis. Veins: No obvious venous abnormality within the limitations of this arterial phase study. Review of the MIP images confirms the above findings. NON-VASCULAR Hepatobiliary: Liver is mildly enlarged. No focal liver lesions are seen. No gallstones are identified. No biliary ductal dilatation. Pancreas: Unremarkable. No pancreatic ductal dilatation or surrounding inflammatory changes. Spleen: Mildly enlarged, unchanged. Adrenals/Urinary Tract: Adrenal glands are unremarkable. Kidneys are normal, without renal calculi, focal lesion, or hydronephrosis. Bladder is unremarkable. Stomach/Bowel: There is mild diffuse colonic wall thickening to the level of the rectum. There is mild surrounding inflammation involving the rectum and distal sigmoid colon. Appendix is not definitely seen. Small bowel and stomach are within normal limits. Lymphatic: No enlarged lymph nodes are identified. Reproductive: Prostate is unremarkable. Other: Small fat containing left inguinal hernia. There is no ascites. There is mild fat stranding and haziness in the retroperitoneum at the level of the inferior aorta and IVC. This is new from prior. Musculoskeletal: No acute or significant osseous findings. Review of the MIP images confirms the above findings. IMPRESSION: 1. No evidence for aortic dissection or aneurysm. 2. Diffuse colonic wall thickening with mild surrounding inflammation compatible with nonspecific colitis. 3. New mild fat stranding and haziness in the retroperitoneum at the level of the inferior aorta and IVC. Findings may be related to infectious or inflammatory process. 4. Severe focal  stenosis at the origin of the celiac artery and superior mesenteric artery. 5. Stable hepatosplenomegaly. Electronically Signed   By: Ronney Asters M.D.   On: 09/30/2022 20:14   CT Head Wo Contrast  Result Date: 09/30/2022 CLINICAL DATA:  Headache EXAM: CT HEAD WITHOUT CONTRAST TECHNIQUE: Contiguous axial images were obtained from the base of the skull through the vertex without intravenous contrast. RADIATION DOSE REDUCTION: This exam was performed according to the departmental dose-optimization program which includes automated exposure control, adjustment of the mA and/or kV according to patient size and/or use of iterative reconstruction technique. COMPARISON:  08/19/2022 FINDINGS: Brain: No evidence of acute infarction, hemorrhage, hydrocephalus, extra-axial collection or mass lesion/mass effect. Mild subcortical white matter and periventricular small vessel ischemic changes. Vascular: Mild intracranial atherosclerosis. Skull: Normal. Negative for fracture or focal lesion. Sinuses/Orbits: The visualized paranasal sinuses are essentially clear. The mastoid air cells are unopacified. Other: None. IMPRESSION: No acute intracranial abnormality. Mild small vessel ischemic changes. Electronically Signed   By: Julian Hy M.D.   On: 09/30/2022 20:06   DG Chest 2 View  Result Date: 09/30/2022 CLINICAL DATA:  Chest pain, shortness of breath EXAM: CHEST - 2 VIEW COMPARISON:  Previous studies including the examination of 08/19/2022 FINDINGS: Transverse diameter of heart is increased. Increase in AP diameter of chest suggests COPD. There are no signs of pulmonary edema or focal pulmonary consolidation. There is no pleural effusion or pneumothorax. IMPRESSION: Cardiomegaly. There are no signs of pulmonary edema or focal pulmonary consolidation. Electronically Signed   By: Elmer Picker M.D.   On: 09/30/2022 12:21     Assessment and Plan:   Hx CAD Chest pain Sinus tachycardia  Patient with vague  symptoms of chest discomfort occurring spontaneously this past weekend and chronically with exertion for quite some time. Last cocaine use per patient was months ago but UDS positive for cocaine 10/16. ECG this admission appears stable, no acute ischemic changes. Troponin trended, 83, 83, 62, 64.  CT angiography chest/abdomen/pelvis without evidence of acute aortic dissection or aneurysm.  TTE with LVEF 60-65% and is without wall motion abnormalities. Patient does not appear to have active chest pain at this time.  Do not suspect ACS at this time.  Troponin likely elevated due to supply/demand mismatch with known CAD and recent cocaine use. However would be reasonable to Myoview stress while admitted given patient's history of CAD and possible noncompliance with follow-up. Ordered for tomorrow per Dr. Ellyn Hack. Will add low dose Cardizem for anti-anginal therapy/rate control. Would avoid beta blockers with cocaine use. Can consolidate to long acting at discharge if effective. Patient appears to be sensitive to vasodilators but Imdur would be another consideration for anginal symptoms.  Hyperlipidemia  LDL 87. Continue Crestor '40mg'$ . Patient will need regular LFT evaluation with chronic alcohol use.   GI bleed  Patient reporting chronic rectal bleeding for the last year. Primarily with wiping after bowel movements but in the past few weeks, this progress to a significant amount of blood in the toilet. Patient now with both rectal and abdominal pain. HGB 8.4 today, GI is following. Abdominal CT with diffuse colonic wall thickening but patient not having diarrhea. GI is planning a flex sig tomorrow for further evaluation of known internal hemorrhoid (noted in 08/22 colonoscopy).  Polysubstance abuse  Patient with history of cocaine, tobacco, alcohol abuse. UDS positive for cocaine. Cessation of all reinforced with patient.  Risk Assessment/Risk Scores:                For questions or updates,  please contact [place] Please consult [email or web address] for contact info under    Signed, Tavien Chestnut, PA-C  10/01/2022 1:29 PM

## 2022-10-01 NOTE — ED Notes (Signed)
Pt in bed NAD. States that he is having severe pain all over his body, states that it feels worse than his chronic pain.
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
## 2022-10-01 NOTE — ED Notes (Signed)
Pt too unsteady to ambulate in hall with pulse ox

## 2022-10-01 NOTE — ED Notes (Signed)
Pt states pain has improved a small amount    Pt provided bagged lunch, soda and crackers Pt given more warm blankets and call bell is within reach
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
## 2022-10-01 NOTE — Progress Notes (Signed)
  Echocardiogram 2D Echocardiogram has been performed.  Ethan Wells 10/01/2022, 10:51 AM

## 2022-10-01 NOTE — TOC CAGE-AID Note (Signed)
Transition of Care [facility]) - CAGE-AID Screening   Patient Details  Name: Ethan Wells MRN: [id number] Date of Birth: [birth year]  Transition of Care [facility]) CM/SW Contact:    Archie Endo, LCSW Phone Number: 10/01/2022, 2:17 PM   Clinical Narrative: CSW spoke with patient at bedside to complete assessment. Patient agreeable for resources to be added to his AVS.   CAGE-AID Screening:    Have You Ever Felt You Ought to Cut Down on Your Drinking or Drug Use?: No Have People Annoyed You By Critizing Your Drinking Or Drug Use?: No Have You Felt Bad Or Guilty About Your Drinking Or Drug Use?: No Have You Ever Had a Drink or Used Drugs First Thing In The Morning to Steady Your Nerves or to Get Rid of a Hangover?: Yes CAGE-AID Score: 1  Substance Abuse Education Offered: Yes  Substance abuse interventions: Patient Counseling

## 2022-10-02 ENCOUNTER — Encounter (HOSPITAL_COMMUNITY): Payer: Self-pay | Admitting: Family Medicine

## 2022-10-02 ENCOUNTER — Inpatient Hospital Stay (HOSPITAL_COMMUNITY): Payer: Medicare HMO | Admitting: Anesthesiology

## 2022-10-02 ENCOUNTER — Encounter (HOSPITAL_COMMUNITY)
Admission: EM | Disposition: A | Discharge status: Home / Self Care | Payer: Self-pay | Source: Home / Self Care | Attending: Internal Medicine

## 2022-10-02 DIAGNOSIS — I2510 Atherosclerotic heart disease of native coronary artery without angina pectoris: Secondary | ICD-10-CM

## 2022-10-02 DIAGNOSIS — K529 Noninfective gastroenteritis and colitis, unspecified: Secondary | ICD-10-CM | POA: Diagnosis not present

## 2022-10-02 DIAGNOSIS — I110 Hypertensive heart disease with heart failure: Secondary | ICD-10-CM

## 2022-10-02 DIAGNOSIS — I509 Heart failure, unspecified: Secondary | ICD-10-CM

## 2022-10-02 DIAGNOSIS — K649 Unspecified hemorrhoids: Secondary | ICD-10-CM

## 2022-10-02 DIAGNOSIS — F17210 Nicotine dependence, cigarettes, uncomplicated: Secondary | ICD-10-CM

## 2022-10-02 DIAGNOSIS — R079 Chest pain, unspecified: Secondary | ICD-10-CM | POA: Diagnosis not present

## 2022-10-02 DIAGNOSIS — R933 Abnormal findings on diagnostic imaging of other parts of digestive tract: Secondary | ICD-10-CM

## 2022-10-02 DIAGNOSIS — F1090 Alcohol use, unspecified, uncomplicated: Secondary | ICD-10-CM | POA: Diagnosis not present

## 2022-10-02 DIAGNOSIS — K921 Melena: Secondary | ICD-10-CM

## 2022-10-02 HISTORY — PX: FLEXIBLE SIGMOIDOSCOPY: SHX5431

## 2022-10-02 LAB — COMPREHENSIVE METABOLIC PANEL
ALT: 26 U/L (ref 0–44)
AST: 78 U/L — ABNORMAL HIGH (ref 15–41)
Albumin: 2.3 g/dL — ABNORMAL LOW (ref 3.5–5.0)
Alkaline Phosphatase: 237 U/L — ABNORMAL HIGH (ref 38–126)
Anion gap: 6 (ref 5–15)
BUN: <5 mg/dL — ABNORMAL LOW (ref 6–20)
CO2: 22 mmol/L (ref 22–32)
Calcium: 8 mg/dL — ABNORMAL LOW (ref 8.9–10.3)
Chloride: 107 mmol/L (ref 98–111)
Creatinine, Ser: 0.68 mg/dL (ref 0.61–1.24)
GFR, Estimated: >60 mL/min (ref >60)
Glucose, Bld: 124 mg/dL — ABNORMAL HIGH (ref 70–99)
Potassium: 3.7 mmol/L (ref 3.5–5.1)
Sodium: 135 mmol/L (ref 135–145)
Total Bilirubin: 1.6 mg/dL — ABNORMAL HIGH (ref 0.3–1.2)
Total Protein: 6.5 g/dL (ref 6.5–8.1)

## 2022-10-02 LAB — CBG MONITORING, ED
Glucose-Capillary: 144 mg/dL — ABNORMAL HIGH (ref 70–99)
Glucose-Capillary: 103 mg/dL — ABNORMAL HIGH (ref 70–99)

## 2022-10-02 LAB — CBC
HCT: 26.6 % — ABNORMAL LOW (ref 39.0–52.0)
Hemoglobin: 8.4 g/dL — ABNORMAL LOW (ref 13.0–17.0)
MCH: 30.4 pg (ref 26.0–34.0)
MCHC: 31.6 g/dL (ref 30.0–36.0)
MCV: 96.4 fL (ref 80.0–100.0)
Platelets: 152 10*3/uL (ref 150–400)
RBC: 2.76 MIL/uL — ABNORMAL LOW (ref 4.22–5.81)
RDW: 19.3 % — ABNORMAL HIGH (ref 11.5–15.5)
WBC: 5.3 10*3/uL (ref 4.0–10.5)
nRBC: 0 % (ref 0.0–0.2)

## 2022-10-02 LAB — MAGNESIUM: Magnesium: 2.2 mg/dL (ref 1.7–2.4)

## 2022-10-02 LAB — GLUCOSE, CAPILLARY
Glucose-Capillary: 147 mg/dL — ABNORMAL HIGH (ref 70–99)
Glucose-Capillary: 133 mg/dL — ABNORMAL HIGH (ref 70–99)

## 2022-10-02 LAB — PHOSPHORUS: Phosphorus: 2.9 mg/dL (ref 2.5–4.6)

## 2022-10-02 SURGERY — SIGMOIDOSCOPY, FLEXIBLE
Anesthesia: General

## 2022-10-02 MED ORDER — SODIUM CHLORIDE 0.9 % IV SOLN: INTRAVENOUS | Status: DC | PRN

## 2022-10-02 MED ORDER — MAGNESIUM SULFATE 4 GM/100ML IV SOLN
4.0000 g | Freq: Once | INTRAVENOUS | Status: AC
  Administered 2022-10-02: 4 g via INTRAVENOUS
  Filled 2022-10-02: qty 100

## 2022-10-02 MED ORDER — PROPOFOL 500 MG/50ML IV EMUL
INTRAVENOUS | Status: DC | PRN
  Administered 2022-10-02: 100 ug/kg/min via INTRAVENOUS

## 2022-10-02 MED ORDER — LIDOCAINE 2% (20 MG/ML) 5 ML SYRINGE
INTRAMUSCULAR | Status: DC | PRN
  Administered 2022-10-02: 40 mg via INTRAVENOUS

## 2022-10-02 MED ORDER — CHLORDIAZEPOXIDE HCL 5 MG PO CAPS
5.0000 mg | ORAL_CAPSULE | Freq: Three times a day (TID) | ORAL | Status: DC
  Administered 2022-10-02 – 2022-10-03 (×3): 5 mg via ORAL
  Filled 2022-10-02 (×3): qty 1

## 2022-10-02 NOTE — ED Notes (Signed)
Pt on call light complaining of increased pain in head and sharp pains all over his body.

## 2022-10-02 NOTE — Anesthesia Preprocedure Evaluation (Addendum)
Anesthesia Evaluation  Patient identified by MRN, date of birth, ID band Patient awake    Reviewed: Allergy & Precautions, NPO status , Patient's Chart, lab work & pertinent test results  History of Anesthesia Complications Negative for: history of anesthetic complications  Airway Mallampati: III  TM Distance: >3 FB Neck ROM: Full    Dental  (+) Dental Advidsory Given, Missing, Poor Dentition, Chipped   Pulmonary shortness of breath, neg COPD, neg recent URI, Current Smoker and Patient abstained from smoking.   Pulmonary exam normal        Cardiovascular Exercise Tolerance: Poor hypertension, (-) angina + CAD, + Past MI, + Cardiac Stents (x1 in 2012) and +CHF  (-) CABG Normal cardiovascular exam+ dysrhythmias (cardiac arrest during STEMI) (-) Valvular Problems/Murmurs  Echo 10/23  1. Left ventricular ejection fraction, by estimation, is 60 to 65%. Left  ventricular ejection fraction by 2D MOD biplane is 64.0 %. The left  ventricle has normal function. The left ventricle has no regional wall  motion abnormalities. The left  ventricular internal cavity size was mildly dilated. Left ventricular  diastolic parameters are consistent with Grade I diastolic dysfunction  (impaired relaxation).   2. Right ventricular systolic function is normal. The right ventricular  size is normal.   3. Left atrial size was moderately dilated.   4. The mitral valve is grossly normal. Trivial mitral valve  regurgitation.   5. The aortic valve is tricuspid. Aortic valve regurgitation is not  visualized.     Neuro/Psych  Headaches, neg Seizures PSYCHIATRIC DISORDERS Anxiety Depression       GI/Hepatic ,GERD  Controlled,,(+)     substance abuse  alcohol use and cocaine useEpigastric pain    Endo/Other  negative endocrine ROS  BMI 33  Renal/GU negative Renal ROS     Musculoskeletal negative musculoskeletal ROS (+)    Abdominal  (+) + obese   Peds  Hematology  (+) Blood dyscrasia, anemia   Anesthesia Other Findings Past Medical History: No date: CAD (coronary artery disease)     Comment:  a. 01/2011 Anterior STEMI/Cath/PCI: LM nl, LAD 100d               (3.5x72m Vision BMS placed), LCX 216mRI 50, RCA min               irregs, EF 40% w/ apical, inferoapical HK. No date: Cardiac arrest - ventricular fibrillation     Comment:  a. In setting of STEMI 01/2011 No date: Chronic pain No date: Cocaine abuse (HCC) No date: Depression with anxiety No date: ETOH abuse     Comment:  a. 6-12 beers/day No date: GERD (gastroesophageal reflux disease) No date: Hemorrhoids No date: Hyperlipidemia No date: Hypertension No date: Ischemic cardiomyopathy     Comment:  a. 06/2011 Echo: EF 45-50%, No rwma No date: Marijuana abuse No date: Migraines No date: Tobacco abuse     Comment:  a. 1/2 ppd x 26 yrs   Reproductive/Obstetrics negative OB ROS                             Anesthesia Physical Anesthesia Plan  ASA: 4  Anesthesia Plan: General   Post-op Pain Management:    Induction: Intravenous  PONV Risk Score and Plan: 1 and Propofol infusion, TIVA and Treatment may vary due to age or medical condition  Airway Management Planned: Natural Airway and Nasal Cannula  Additional Equipment: None  Intra-op Plan:  Post-operative Plan:   Informed Consent: I have reviewed the patients History and Physical, chart, labs and discussed the procedure including the risks, benefits and alternatives for the proposed anesthesia with the patient or authorized representative who has indicated his/her understanding and acceptance.     Dental advisory given  Plan Discussed with: CRNA  Anesthesia Plan Comments:         Anesthesia Quick Evaluation

## 2022-10-02 NOTE — ED Notes (Signed)
Pt transported to Endo 

## 2022-10-02 NOTE — Anesthesia Procedure Notes (Signed)
Procedure Name: MAC Date/Time: 10/02/2022 12:19 PM  Performed by: Kyung Rudd, CRNAPre-anesthesia Checklist: Patient identified, Emergency Drugs available, Suction available and Patient being monitored Patient Re-evaluated:Patient Re-evaluated prior to induction Oxygen Delivery Method: Simple face mask Induction Type: IV induction Placement Confirmation: positive ETCO2 Dental Injury: Teeth and Oropharynx as per pre-operative assessment

## 2022-10-02 NOTE — ED Notes (Signed)
Enema given at this time per endo request.

## 2022-10-02 NOTE — ED Notes (Signed)
Gastro paged for [employer]

## 2022-10-02 NOTE — Interval H&P Note (Signed)
History and Physical Interval Note:  10/02/2022 11:59 AM  Ethan Wells  has presented today for surgery, with the diagnosis of Rectal bleeding.  Rectal pain..  The various methods of treatment have been discussed with the patient and family. After consideration of risks, benefits and other options for treatment, the patient has consented to  Procedure(s): FLEXIBLE SIGMOIDOSCOPY (N/A) as a surgical intervention.  The patient's history has been reviewed, patient examined, no change in status, stable for surgery.  I have reviewed the patient's chart and labs.  Questions were answered to the patient's satisfaction.     Pricilla Riffle. Fuller Plan

## 2022-10-02 NOTE — ED Notes (Signed)
Pt on call bell complaining of increased pain and discomfort.

## 2022-10-02 NOTE — Transfer of Care (Signed)
Immediate Anesthesia Transfer of Care Note  [name]  Procedure(s) Performed: FLEXIBLE SIGMOIDOSCOPY  Patient Location: PACU  Anesthesia Type:MAC  Level of Consciousness: awake, alert  and oriented  Airway & Oxygen Therapy: Patient Spontanous Breathing  Post-op Assessment: Report given to RN, Post -op Vital signs reviewed and stable and Patient moving all extremities  Post vital signs: Reviewed and stable  Last Vitals:  Vitals Value Taken Time  BP 102/64 10/02/22 1246  Temp 36.5 C 10/02/22 1246  Pulse 92 10/02/22 1248  Resp 16 10/02/22 1248  SpO2 99 % 10/02/22 1248  Vitals shown include unvalidated device data.  Last Pain:  Vitals:   10/02/22 1246  TempSrc:   PainSc: 0-No pain         Complications: No notable events documented.

## 2022-10-02 NOTE — Progress Notes (Signed)
  Progress Note   [name] PJK:[id number] DOB: 1971/06/10 DOA: 09/30/2022     1 DOS: the patient was seen and examined on 10/02/2022   Brief hospital course: 51yo with hx cocaine and etoh abuse who presented with rectal bleeding and chest pains. GI was consulted and pt is now s/p flex sig with findings of hemorrhoids which were noted to be the source of rectal bleed with recs for anusol HC supp PR bid x 1 week then PR daily x 1 week. Cardiology was also consulted, pending stress testing  Assessment and Plan: Chest pain -Without chest pain this AM -Cardiology consulted and following, initial plan for stress test today, however pt underwent flex sig, thus was unable to perform study. Now rescheduled for 10/19 -Now on diltiazem  for antianginal effect -Cont to monitor on tele  Colitis  -rocephin and flagyl started at time of presentation -no leukocytosis -No reported stool this AM  Acute blood loss anemia secondary to hemorrhoids -Hgb stable -Now s/p flex sig with evidence of hemorrhoids -GI recs for anusol PR BID x 1 week followed by PR daily x 1 week  ETOH abuse -Cessation done at time of presentation -CIWA up to 13 this afternoon -Cont CIWA protocol. Will add scheduled librium  Cocaine abuse -cessation done this hospital visit  Hyponatremia -suspect secondary to ETOH abuse -Na normalized this AM -repeat cmp in AM  Chronic systolic CHF -appears compensated this a M  Alcoholic fatty liver disease, seen on CT -recommend ETOH cessation     Subjective: Seen this AM prior to flex sig. Eager to have something to eat  Physical Exam: Vitals:   10/02/22 1300 10/02/22 1302 10/02/22 1326 10/02/22 1603  BP: 104/67  131/88 115/69  Pulse: 83 87 90 98  Resp: 14 16    Temp:  97.6 F (36.4 C) (!) 97.5 F (36.4 C) (!) 97.5 F (36.4 C)  TempSrc:   Oral Oral  SpO2: 98% 98% 100% 97%  Weight:      Height:       General exam: Awake, laying in bed, in  nad Respiratory system: Normal respiratory effort, no wheezing Cardiovascular system: regular rate, s1, s2 Gastrointestinal system: Soft, nondistended, positive BS Central nervous system: CN2-12 grossly intact, strength intact Extremities: Perfused, no clubbing Skin: Normal skin turgor, no notable skin lesions seen Psychiatry: Mood normal // no visual hallucinations   Data Reviewed:  Labs reviewed: na 135, K 3.7, Cr 0.68, Mg 1.6    Family Communication: Pt in room, family not at bedside  Disposition: Status is: Inpatient Remains inpatient appropriate because: Severity of illness  Planned Discharge Destination: Home   Author: Marylu Lund, MD 10/02/2022 4:28 PM  For on call review [email or web address].

## 2022-10-02 NOTE — Progress Notes (Signed)
   Cardiology plan from yesterday was Myoview stress test.  This may have been delayed because of his flex sig this morning.  Anticipate that he will probably be here until tomorrow.  If necessary can be rescheduled.  We will be available if there is any additional concerns, but up but otherwise await results of his testing for further recommendations.   Glenetta Hew, MD

## 2022-10-02 NOTE — Hospital Course (Addendum)
51yo with hx cocaine and etoh abuse who presented with rectal bleeding and chest pains. GI was consulted and pt is now s/p flex sig with findings of hemorrhoids which were noted to be the source of rectal bleed with recs for anusol HC supp PR bid x 1 week then PR daily x 1 week. Cardiology was also consulted, pending stress testing. Pt found to have evidence of colitis on imaging

## 2022-10-02 NOTE — Op Note (Addendum)
[facility] Patient Name: Ethan Wells Procedure Date : 10/02/2022 MRN: [id number] Attending MD: Ladene Artist , MD Date of Birth: 05/02/1971 CSN: [id number] Age: 51 Admit Type: Inpatient Procedure:                Flexible Sigmoidoscopy Indications:              Hematochezia, Abnormal CT of the GI tract (colon) Providers:                Pricilla Riffle. Fuller Plan, MD, Mikey College, RN, Gloris Ham, Technician Referring MD:             [facility] Medicines:                Propofol per Anesthesia Complications:            No immediate complications. Estimated Blood Loss:     Estimated blood loss: none. Procedure:                Pre-Anesthesia Assessment:                           - Prior to the procedure, a History and Physical                            was performed, and patient medications and                            allergies were reviewed. The patient's tolerance of                            previous anesthesia was also reviewed. The risks                            and benefits of the procedure and the sedation                            options and risks were discussed with the patient.                            All questions were answered, and informed consent                            was obtained. Prior Anticoagulants: The patient has                            taken no previous anticoagulant or antiplatelet                            agents. ASA Grade Assessment: III - A patient with                            severe systemic disease. After reviewing the risks  and benefits, the patient was deemed in                            satisfactory condition to undergo the procedure.                           After obtaining informed consent, the scope was                            passed under direct vision. The PCF-HQ190L                            ([id number] Olympus peds colonscope was introduced                             through the anus and advanced to the sigmoid colon,                            35 cm. The flexible sigmoidoscopy was accomplished                            without difficulty. The patient tolerated the                            procedure well. The quality of the bowel                            preparation was fair. Scope In: Scope Out: Findings:      The perianal and digital rectal examinations were normal.      -Moderate-sized Grade I internal hemorrhoids      The examined colon to 35 cm otherwise appeared normal. Impression:               - Preparation of the colon was fair.                           - Moderate-sized Grade I internal hemorrhoids                           - The examined colon to 35 cm otherwise appeared                            normal.                           - No specimens collected. Recommendation:           - Return patient to hospital ward for ongoing care.                           - Hemorrhoids are the source of rectal bleeding.                           - No evidence of colitis or other colonic mucosal  process.                           - Anusol HC supp PR bid for 1 week then PR qd for 1                            week.                           - Return to GI office, Drs. Narda Amber, at                            the next available appointment for consideration of                            hemorrhoid banding.                           - OK for discharge from GI standpoint.                           - GI signing off. Procedure Code(s):        --- Professional ---                           [phone], Sigmoidoscopy, flexible; diagnostic,                            including collection of specimen(s) by brushing or                            washing, when performed (separate procedure) Diagnosis Code(s):        --- Professional ---                           K92.1, Melena (includes Hematochezia) CPT copyright  2019 American Medical Association. All rights reserved. The codes documented in this report are preliminary and upon coder review may  be revised to meet current compliance requirements. Ladene Artist, MD 10/02/2022 12:45:48 PM This report has been signed electronically. Number of Addenda: 0

## 2022-10-03 ENCOUNTER — Inpatient Hospital Stay (HOSPITAL_COMMUNITY): Payer: Medicare HMO

## 2022-10-03 ENCOUNTER — Other Ambulatory Visit (HOSPITAL_COMMUNITY): Payer: Self-pay

## 2022-10-03 DIAGNOSIS — R079 Chest pain, unspecified: Secondary | ICD-10-CM | POA: Diagnosis not present

## 2022-10-03 DIAGNOSIS — I5022 Chronic systolic (congestive) heart failure: Secondary | ICD-10-CM | POA: Diagnosis not present

## 2022-10-03 DIAGNOSIS — F1090 Alcohol use, unspecified, uncomplicated: Secondary | ICD-10-CM | POA: Diagnosis not present

## 2022-10-03 DIAGNOSIS — F1410 Cocaine abuse, uncomplicated: Secondary | ICD-10-CM | POA: Diagnosis not present

## 2022-10-03 DIAGNOSIS — I25118 Atherosclerotic heart disease of native coronary artery with other forms of angina pectoris: Secondary | ICD-10-CM | POA: Diagnosis not present

## 2022-10-03 DIAGNOSIS — R7989 Other specified abnormal findings of blood chemistry: Secondary | ICD-10-CM | POA: Diagnosis not present

## 2022-10-03 DIAGNOSIS — K529 Noninfective gastroenteritis and colitis, unspecified: Secondary | ICD-10-CM | POA: Diagnosis not present

## 2022-10-03 LAB — NM MYOCAR MULTI W/SPECT W/WALL MOTION / EF
Base ST Depression (mm): 0 mm
Estimated workload: 1
Exercise duration (min): 5 min
Exercise duration (sec): 17 s
MPHR: 170 {beats}/min
Nuc Stress EF: 53 %
Peak HR: 102 {beats}/min
Percent HR: 60 %
Rest HR: 88 {beats}/min
ST Depression (mm): 0 mm

## 2022-10-03 LAB — COMPREHENSIVE METABOLIC PANEL
ALT: 23 U/L (ref 0–44)
AST: 62 U/L — ABNORMAL HIGH (ref 15–41)
Albumin: 2.4 g/dL — ABNORMAL LOW (ref 3.5–5.0)
Alkaline Phosphatase: 230 U/L — ABNORMAL HIGH (ref 38–126)
Anion gap: 8 (ref 5–15)
BUN: <5 mg/dL — ABNORMAL LOW (ref 6–20)
CO2: 22 mmol/L (ref 22–32)
Calcium: 7.9 mg/dL — ABNORMAL LOW (ref 8.9–10.3)
Chloride: 106 mmol/L (ref 98–111)
Creatinine, Ser: 0.81 mg/dL (ref 0.61–1.24)
GFR, Estimated: >60 mL/min (ref >60)
Glucose, Bld: 96 mg/dL (ref 70–99)
Potassium: 3.4 mmol/L — ABNORMAL LOW (ref 3.5–5.1)
Sodium: 136 mmol/L (ref 135–145)
Total Bilirubin: 1.5 mg/dL — ABNORMAL HIGH (ref 0.3–1.2)
Total Protein: 6.6 g/dL (ref 6.5–8.1)

## 2022-10-03 LAB — CBC
HCT: 24.6 % — ABNORMAL LOW (ref 39.0–52.0)
Hemoglobin: 7.9 g/dL — ABNORMAL LOW (ref 13.0–17.0)
MCH: 30 pg (ref 26.0–34.0)
MCHC: 32.1 g/dL (ref 30.0–36.0)
MCV: 93.5 fL (ref 80.0–100.0)
Platelets: 139 10*3/uL — ABNORMAL LOW (ref 150–400)
RBC: 2.63 MIL/uL — ABNORMAL LOW (ref 4.22–5.81)
RDW: 19.4 % — ABNORMAL HIGH (ref 11.5–15.5)
WBC: 5.9 10*3/uL (ref 4.0–10.5)
nRBC: 0 % (ref 0.0–0.2)

## 2022-10-03 LAB — GLUCOSE, CAPILLARY
Glucose-Capillary: 77 mg/dL (ref 70–99)
Glucose-Capillary: 152 mg/dL — ABNORMAL HIGH (ref 70–99)
Glucose-Capillary: 171 mg/dL — ABNORMAL HIGH (ref 70–99)

## 2022-10-03 LAB — MAGNESIUM: Magnesium: 2.3 mg/dL (ref 1.7–2.4)

## 2022-10-03 MED ORDER — METHOCARBAMOL 500 MG PO TABS
500.0000 mg | ORAL_TABLET | Freq: Three times a day (TID) | ORAL | 0 refills | Status: DC | PRN
  Filled 2022-10-03: qty 10, 4d supply, fill #0

## 2022-10-03 MED ORDER — REGADENOSON 0.4 MG/5ML IV SOLN
INTRAVENOUS | Status: AC
  Administered 2022-10-03: 0.4 mg via INTRAVENOUS
  Filled 2022-10-03: qty 5

## 2022-10-03 MED ORDER — HYDROCORTISONE ACETATE 25 MG RE SUPP
RECTAL | 0 refills | Status: DC
  Filled 2022-10-03: qty 21, 14d supply, fill #0

## 2022-10-03 MED ORDER — EZETIMIBE 10 MG PO TABS
10.0000 mg | ORAL_TABLET | Freq: Every day | ORAL | Status: DC
  Administered 2022-10-03: 10 mg via ORAL
  Filled 2022-10-03: qty 1

## 2022-10-03 MED ORDER — DILTIAZEM HCL 30 MG PO TABS
30.0000 mg | ORAL_TABLET | Freq: Two times a day (BID) | ORAL | 0 refills | Status: DC
  Filled 2022-10-03: qty 60, 30d supply, fill #0

## 2022-10-03 MED ORDER — ENSURE ENLIVE PO LIQD: 237.0000 mL | Freq: Two times a day (BID) | ORAL | Status: DC

## 2022-10-03 MED ORDER — REGADENOSON 0.4 MG/5ML IV SOLN
0.4000 mg | Freq: Once | INTRAVENOUS | Status: AC
  Filled 2022-10-03: qty 5

## 2022-10-03 MED ORDER — METRONIDAZOLE 500 MG PO TABS
500.0000 mg | ORAL_TABLET | Freq: Three times a day (TID) | ORAL | 0 refills | Status: AC
  Filled 2022-10-03: qty 12, 4d supply, fill #0

## 2022-10-03 MED ORDER — CEFDINIR 300 MG PO CAPS
300.0000 mg | ORAL_CAPSULE | Freq: Two times a day (BID) | ORAL | 0 refills | Status: AC
  Filled 2022-10-03: qty 8, 4d supply, fill #0

## 2022-10-03 MED ORDER — TECHNETIUM TC 99M TETROFOSMIN IV KIT
10.8000 | PACK | Freq: Once | INTRAVENOUS | Status: AC | PRN
  Administered 2022-10-03: 10.8 via INTRAVENOUS

## 2022-10-03 MED ORDER — POTASSIUM CHLORIDE CRYS ER 20 MEQ PO TBCR
60.0000 meq | EXTENDED_RELEASE_TABLET | Freq: Once | ORAL | Status: AC
  Administered 2022-10-03: 60 meq via ORAL
  Filled 2022-10-03: qty 3

## 2022-10-03 MED ORDER — EZETIMIBE 10 MG PO TABS
10.0000 mg | ORAL_TABLET | Freq: Every day | ORAL | 0 refills | Status: DC
  Filled 2022-10-03: qty 30, 30d supply, fill #0

## 2022-10-03 MED ORDER — METHOCARBAMOL 500 MG PO TABS: 500.0000 mg | ORAL_TABLET | Freq: Three times a day (TID) | ORAL | Status: DC | PRN

## 2022-10-03 MED ORDER — TECHNETIUM TC 99M TETROFOSMIN IV KIT
30.2000 | PACK | Freq: Once | INTRAVENOUS | Status: AC | PRN
  Administered 2022-10-03: 30.2 via INTRAVENOUS

## 2022-10-03 NOTE — Progress Notes (Signed)
Initial Nutrition Assessment  DOCUMENTATION CODES:   Obesity unspecified  INTERVENTION:  - add Ensure Enlive po BID, each supplement provides 350 kcal and 20 grams of protein.  NUTRITION DIAGNOSIS:   Inadequate oral intake related to poor appetite as evidenced by energy intake < 75% for > or equal to 3 months.  GOAL:   Patient will meet greater than or equal to 90% of their needs  MONITOR:   PO intake, Supplement acceptance  REASON FOR ASSESSMENT:   Consult Assessment of nutrition requirement/status  ASSESSMENT:   51 y.o. male admits related to rectal bleeding. PMH reviewed: CAD, cardiac arrest, ETOH abuse, HLD, HTN.  Meds include: folic acid, MVI, Vit B1.   The pt reports that he has been eating better since admission. He states that he was not eating well for about 3 months PTA. He denies any wt loss. Pt would like to try Ensure. RD will add supplement and continue to monitor PO intakes.   NUTRITION - FOCUSED PHYSICAL EXAM:  Flowsheet Row Most Recent Value  Orbital Region No depletion  Upper Arm Region No depletion  Thoracic and Lumbar Region No depletion  Buccal Region No depletion  Temple Region No depletion  Clavicle Bone Region No depletion  Clavicle and Acromion Bone Region No depletion  Scapular Bone Region No depletion  Dorsal Hand No depletion  Patellar Region No depletion  Anterior Thigh Region No depletion  Posterior Calf Region No depletion  Edema (RD Assessment) None  Hair Reviewed  Eyes Reviewed  Mouth Reviewed  Skin Reviewed  Nails Reviewed       Diet Order:   Diet Order             Diet heart healthy/carb modified Room service appropriate? Yes; Fluid consistency: Thin  Diet effective now                   EDUCATION NEEDS:   Not appropriate for education at this time  Skin:  Skin Assessment: Reviewed RN Assessment  Last BM:  09/30/22  Height:   Ht Readings from Last 1 Encounters:  09/30/22 '5\' 5"'$  (1.651 m)    Weight:    Wt Readings from Last 1 Encounters:  09/30/22 84.8 kg    Ideal Body Weight:  61.8 kg  BMI:  Body mass index is 31.12 kg/m.  Estimated Nutritional Needs:   Kcal:  2120-2540 kcals  Protein:  105-125 gm  Fluid:  9373-4287 mL  Thalia Bloodgood, RD, LDN, CNSC

## 2022-10-03 NOTE — Progress Notes (Signed)
Physical Therapy Treatment Patient Details Name: Ethan Wells MRN: [id number] DOB: 15-Sep-1971 Today's Date: 10/03/2022   History of Present Illness Pt is a 51 y/o male admitted 09/30/22 with acute on chronic rectal bleeding; UDS (+) cocaine. Admitted for further colitis workup. S/p flexible sigmoidoscopy 10/18. S/p cardiac stress test 10/19. PMH includes CAD, cardiac arrest, etoh abuse, cocaine abuse, HTN, migraines.   PT Comments    Pt progressing with mobility. Today's session focused on transfer and gait training with rollator; pt moving fairly well with supervision for safety; mod indep with seated ADL tasks. Pt endorses fatigue and headache. Pt declines SNF or need for HHPT services, mainly focused on need for rollator; CM notified. Educ re: activity recommendations, DME needs, fall risk reduction; pt not interested in smoking cessation educ when initiated. Pt hopeful for d/c home tomorrow; will have transportation. If to remain admitted, will continue to follow acutely to address established goals.  SpO2 97% on RA, HR 97 post-ambulation    Recommendations for follow up therapy are one component of a multi-disciplinary discharge planning process, led by the attending physician.  Recommendations may be updated based on patient status, additional functional criteria and insurance authorization.  Follow Up Recommendations  No PT follow up Can patient physically be transported by private vehicle: Yes   Assistance Recommended at Discharge PRN  Patient can return home with the following Assist for transportation   Equipment Recommendations  Rollator (4 wheels)    Recommendations for Other Services       Precautions / Restrictions Precautions Precautions: Fall Restrictions Weight Bearing Restrictions: No     Mobility  Bed Mobility Overal bed mobility: Independent Bed Mobility: Supine to Sit, Sit to Supine                Transfers Overall transfer level: Modified  independent Equipment used: None, Rollator (4 wheels) Transfers: Sit to/from Stand             General transfer comment: reviewed features of rollator, especially locking prior to sit<>stand; pt still opting to sit<>stand without locks    Ambulation/Gait Ambulation/Gait assistance: Supervision Gait Distance (Feet): 200 Feet Assistive device: Rollator (4 wheels) Gait Pattern/deviations: Step-through pattern, Decreased stride length, Trunk flexed Gait velocity: Decreased     General Gait Details: slow, steady gait with rollator, supervision for safety; pt declines further distance secondary to fatigue   Stairs Stairs:  (pt declined stair training; reports no concerns ascending 3 steps into camper, has grab bar)           Wheelchair Mobility    Modified Rankin (Stroke Patients Only)       Balance Overall balance assessment: Needs assistance Sitting-balance support: No upper extremity supported, Feet supported Sitting balance-Leahy Scale: Good Sitting balance - Comments: indep to don/doff shoes sitting EOB   Standing balance support: No upper extremity supported, During functional activity Standing balance-Leahy Scale: Fair Standing balance comment: can static stand and take steps without UE support, guarded; preference for rollator to walk                            Cognition Arousal/Alertness: Awake/alert Behavior During Therapy: Flat affect Overall Cognitive Status: No family/caregiver present to determine baseline cognitive functioning                                 General Comments: WFL for simple tasks; flat  affect but able to make needs known. focused on wanting a cigarette but able to be redirected, not interested in education about quitting tobacco use        Exercises      General Comments General comments (skin integrity, edema, etc.): SpO2 97% on RA, HR 97 post-ambulation. increased time discussing return home, including  activity recommendations, DME needs, fall risk reduction. pt declines follow-up HHPT; feel he is nearing baseline mobility. would definitely benefit from rollator for community mobility      Pertinent Vitals/Pain Pain Assessment Pain Assessment: Faces Faces Pain Scale: Hurts a little bit Pain Location: generalized Pain Descriptors / Indicators: Headache, Tiring Pain Intervention(s): Monitored during session    Home Living                          Prior Function            PT Goals (current goals can now be found in the care plan section) Acute Rehab PT Goals Patient Stated Goal: return home, not interested in SNF or HHPT PT Goal Formulation: With patient Progress towards PT goals: Progressing toward goals    Frequency    Min 2X/week      PT Plan Discharge plan needs to be updated    Co-evaluation              AM-PAC PT "6 Clicks" Mobility   Outcome Measure  Help needed turning from your back to your side while in a flat bed without using bedrails?: None Help needed moving from lying on your back to sitting on the side of a flat bed without using bedrails?: None Help needed moving to and from a bed to a chair (including a wheelchair)?: None Help needed standing up from a chair using your arms (e.g., wheelchair or bedside chair)?: None Help needed to walk in hospital room?: A Little Help needed climbing 3-5 steps with a railing? : A Little 6 Click Score: 22    End of Session   Activity Tolerance: Patient tolerated treatment well Patient left: in bed;with call bell/phone within reach Nurse Communication: Mobility status PT Visit Diagnosis: Unsteadiness on feet (R26.81);Other abnormalities of gait and mobility (R26.89)     Time: 2458-0998 PT Time Calculation (min) (ACUTE ONLY): 24 min  Charges:  $Therapeutic Activity: 8-22 mins $Self Care/Home Management: 8-22                      Mabeline Caras, PT, DPT Acute Rehabilitation Services   Personal: [facility] Office: [place] 10/03/2022, 3:18 PM

## 2022-10-03 NOTE — Plan of Care (Signed)

## 2022-10-03 NOTE — Plan of Care (Signed)
  Problem: Clinical Measurements: Goal: Ability to maintain clinical measurements within normal limits will improve 10/03/2022 0147 by Colonel Bald, RN Outcome: Progressing 10/03/2022 0146 by Colonel Bald, RN Outcome: Progressing Goal: Will remain free from infection 10/03/2022 0147 by Colonel Bald, RN Outcome: Progressing 10/03/2022 0146 by Colonel Bald, RN Outcome: Progressing Goal: Diagnostic test results will improve 10/03/2022 0147 by Colonel Bald, RN Outcome: Progressing 10/03/2022 0146 by Colonel Bald, RN Outcome: Progressing Goal: Respiratory complications will improve 10/03/2022 0147 by Colonel Bald, RN Outcome: Progressing 10/03/2022 0146 by Colonel Bald, RN Outcome: Progressing Goal: Cardiovascular complication will be avoided 10/03/2022 0147 by Colonel Bald, RN Outcome: Progressing 10/03/2022 0146 by Colonel Bald, RN Outcome: Progressing

## 2022-10-03 NOTE — Progress Notes (Addendum)
Rounding Note    Patient Name: Ethan Wells Date of Encounter: 10/03/2022  [place] Cardiologist: Lauree Chandler, MD   Subjective   Patient states he is still having chest pain since he came to the hospital, sometimes worse sometimes better, no specific trigger. He has gotten out of bed once so far, felt unsteady gait. He is agreeable for stress test today. He is a little nervous today and wants something for anxiety.    Inpatient Medications    Scheduled Meds:  chlordiazePOXIDE  5 mg Oral TID   diltiazem  30 mg Oral Q12H   ezetimibe  10 mg Oral Daily   folic acid  1 mg Oral Daily   gabapentin  300 mg Oral TID   guaiFENesin  600 mg Oral BID   hydrocortisone  25 mg Rectal BID   insulin aspart  0-9 Units Subcutaneous TID WC   multivitamin with minerals  1 tablet Oral Daily   nicotine  21 mg Transdermal Daily   pantoprazole  40 mg Oral Q0600   potassium chloride  60 mEq Oral Once   rosuvastatin  40 mg Oral Daily   sodium chloride flush  3 mL Intravenous Q12H   thiamine  100 mg Oral Daily   Or   thiamine  100 mg Intravenous Daily   Continuous Infusions:  sodium chloride 250 mL (10/01/22 1358)   cefTRIAXone (ROCEPHIN)  IV 2 g (10/02/22 2045)   metronidazole 500 mg (10/02/22 2308)   PRN Meds: sodium chloride, acetaminophen **OR** acetaminophen, albuterol, HYDROcodone-acetaminophen, LORazepam **OR** LORazepam, morphine injection, nitroGLYCERIN, sodium chloride flush   Vital Signs    Vitals:   10/03/22 1025 10/03/22 1030 10/03/22 1031 10/03/22 1033  BP: 123/77 121/75 122/77 124/79  Pulse:      Resp:      Temp:      TempSrc:      SpO2:      Weight:      Height:        Intake/Output Summary (Last 24 hours) at 10/03/2022 1104 Last data filed at 10/03/2022 0500 Gross per 24 hour  Intake 1360.51 ml  Output 1075 ml  Net 285.51 ml      09/30/2022   10:18 PM 09/23/2022    9:41 AM 08/19/2022   12:43 PM  Last 3 Weights  Weight (lbs) 187  lb 187 lb 12.8 oz 180 lb  Weight (kg) 84.823 kg 85.186 kg 81.647 kg      Telemetry    Sinus rhythm 90s  - Personally Reviewed  ECG    Sinus rhythm, T wave flattening of inferolateral leads at baseline noted  - Personally Reviewed  Physical Exam   GEN: No acute distress.   Neck: No JVD Cardiac: RRR, no murmurs, rubs, or gallops.  Respiratory: Clear to auscultation bilaterally. On room air GI: Soft, nontender, non-distended  MS: No leg edema; No deformity. Neuro:  Nonfocal  Psych: Normal affect   Labs    High Sensitivity Troponin:   Recent Labs  Lab 09/30/22 1155 09/30/22 1444 09/30/22 2225 10/01/22 0136  TROPONINIHS 83* 83* 62* 64*     Chemistry Recent Labs  Lab 10/01/22 0137 10/02/22 0440 10/03/22 0150  NA 129* 135 136  K 3.5 3.7 3.4*  CL 101 107 106  CO2 20* 22 22  GLUCOSE 116* 124* 96  BUN <5* <5* <5*  CREATININE 0.72 0.68 0.81  CALCIUM 7.5* 8.0* 7.9*  MG 1.6* 2.2 2.3  PROT 6.5 6.5 6.6  ALBUMIN  2.3* 2.3* 2.4*  AST 90* 78* 62*  ALT '29 26 23  '$ ALKPHOS 237* 237* 230*  BILITOT 1.9* 1.6* 1.5*  GFRNONAA >60 >60 >60  ANIONGAP '8 6 8    '$ Lipids  Recent Labs  Lab 09/30/22 2225  CHOL 120  TRIG 64  HDL 20*  LDLCALC 87  CHOLHDL 6.0    Hematology Recent Labs  Lab 10/01/22 1729 10/02/22 0013 10/03/22 0150  WBC 4.7 5.3 5.9  RBC 2.67* 2.76* 2.63*  HGB 8.2* 8.4* 7.9*  HCT 26.1* 26.6* 24.6*  MCV 97.8 96.4 93.5  MCH 30.7 30.4 30.0  MCHC 31.4 31.6 32.1  RDW 19.2* 19.3* 19.4*  PLT 146* 152 139*   Thyroid  Recent Labs  Lab 09/30/22 2225  TSH 1.197    BNP Recent Labs  Lab 09/30/22 1155  BNP 187.2*    DDimer No results for input(s): "DDIMER" in the last 168 hours.   Radiology    No results found.  Cardiac Studies   Echo from 10/01/22:   1. Left ventricular ejection fraction, by estimation, is 60 to 65%. Left  ventricular ejection fraction by 2D MOD biplane is 64.0 %. The left  ventricle has normal function. The left ventricle has no  regional wall  motion abnormalities. The left  ventricular internal cavity size was mildly dilated. Left ventricular  diastolic parameters are consistent with Grade I diastolic dysfunction  (impaired relaxation).   2. Right ventricular systolic function is normal. The right ventricular  size is normal.   3. Left atrial size was moderately dilated.   4. The mitral valve is grossly normal. Trivial mitral valve  regurgitation.   5. The aortic valve is tricuspid. Aortic valve regurgitation is not  visualized.   Comparison(s): Changes from prior study are noted. 12/20/2020: LVEF 38-18%,  normal diastolic function.    Patient Profile     51 y.o. male with PMH of tobacco use, alcohol abuse, cocaine abuse, CAD status post BMS PCI to LAD 2993 complicated by V-fib arrest, recovered ischemic cardiomyopathy, chronic anemia, Barrett's esophagus, alcoholic fatty liver disease, who is currently admitted for chest pain, infectious colitis, acute blood loss anemia due to GI bleed.  Cardiology is following for chest pain and elevated troponin.   Assessment & Plan   Chest pain Elevated troponin CAD with history of PCI to LAD 2012 History of ischemic cardiomyopathy recovered EF -Presented with nonexertional chest pain, without specific trigger -High sensitive troponin flat 83 >64 -EKG with T wave flattening of inferior lateral leads, appears new compared to EKG from 08/19/2022 -Echocardiogram 10/01/2022 with LVEF 60 to 65%, no regional wall motion abnormality, grade 1 DD, normal RV, moderate LAE, trivial MR -NM stress Myoview planned today, no significant escalation of chest pain during stress test, final result pending -LDL 87, goal less than 70, TSH WNL -Medical therapy: Continue home meds aspirin, lisinopril, Crestor; added diltiazem for antianginal therapy; will add Zetia '10mg'$  daily, beta-blocker has been avoided in the setting of cocaine abuse, may use carvedilol if needed -Strongly recommended  cocaine /tobacco/ETOH cessation  Hyperlipidemia -LDL 87, Crestor 40 mg daily, will add Zetia 10 mg daily  Acute blood loss anemia due to GI bleed -Flex sig revealed rectal bleed from internal hemorrhoids -PPI recommended per GI -Transfuse to keep hemoglobin above 7 - OK continue ASA   Infectious colitis Hypokalemia Cocaine abuse Tobacco abuse Alcohol abuse Alcoholic fatty liver disease Anxiety -Defer further management to internal medicine      For questions or updates,  please contact [place] Please consult [email or web address] for contact info under        Signed, Margie Billet, NP  10/03/2022, 11:04 AM     ATTENDING ATTESTATION  I have seen, examined and evaluated the patient this pm - after stress test completion. After reviewing all the available data and chart, we discussed the patients laboratory, study & physical findings as well as symptoms in detail.  I agree with the findings, examination as well as impression recommendations written by Margie Billet, NP, as per our discussion.    Myovirw Results reviewed -- LOW RISK - ? Fixed basal inferior defect noted -? With abnormal wall motion --> interesting location for a piror infarct (not consistent with coronary anatomy & Echo does not suggest Inferobasal HK) -- NO ischemia.    This would argue against Ischemic Chest Pain.    Rec; Continued evaluation for Non-cardiac etiology of chest pain.    [place] will sign off.   Medication Recommendations:  Continue current cardiology meds Other recommendations (labs, testing, etc):  no further CV testing  Follow up as an outpatient:  will arrange with primary Cardiologist or APP team.     Leonie Man, MD, MS Glenetta Hew, M.D., M.S. Interventional Cardiologist  [place]  Pager # [phone] Phone # [phone] [address] [place], [place] [address]

## 2022-10-03 NOTE — TOC Progression Note (Addendum)
Transition of Care [facility]) - Progression Note    Patient Details  Name: Ethan Wells MRN: [id number] Date of Birth: [birth year]  [facility] CM/SW [place], [place] Phone Number: 10/03/2022, 12:22 PM  Clinical Narrative:     CSW received consult for possible SNF placement at time of discharge. CSW spoke with patient at bedside regarding PT recommendation of SNF placement at time of discharge. Patient expressed understanding of PT recommendation and declined  SNF placement at time of discharge.Patient reports he comes from home alone. Patient reports he currently lives in a camper. Patient reports his plan is to return back home to his camper at dc. Patient reports he will need transportation at dc if discharges today. Patient reports if he discharges tomorrow his cousin can come pick him up. Patient requested a wheelchair at dc. CSW informed CM. No further questions reported at this time.CSW to continue to follow and assist with discharge planning needs.    Expected Discharge Plan: Home/Self Care Barriers to Discharge: Continued Medical Work up  Expected Discharge Plan and Services Expected Discharge Plan: Home/Self Care       Living arrangements for the past 2 months: Apartment                                       Social Determinants of Health (SDOH) Interventions Housing Interventions: Intervention Not Indicated Transportation Interventions: Intervention Not Indicated Alcohol Usage Interventions: Alohol Education/Brief Advice  Readmission Risk Interventions     No data to display

## 2022-10-03 NOTE — Progress Notes (Signed)
       Ethan Wells presented for a nuclear stress test today.  No immediate complications.  Stress imaging is pending at this time.  Preliminary EKG findings may be listed in the chart, but the stress test result will not be finalized until perfusion imaging is complete.  1 day study, 1 to read.   Margie Billet, ACAGNP-BC 10/03/2022, 10:45 AM

## 2022-10-03 NOTE — TOC Transition Note (Addendum)
Transition of Care [facility]) - CM/SW Discharge Note   Patient Details  Name: Ethan Wells MRN: [id number] Date of Birth: 26-May-1971  Transition [facility]) CM/SW Contact:  Bethena Roys, RN Phone Number: 10/03/2022, 3:16 PM   Clinical Narrative:  DME choice provided to the patient. Patient is agreeable to use [employer]. DME ordered via [employer] and the DME will be delivered to the room prior to transition home.    1625 10-03-22 Case Manager received notification from [place]: Patient received a rolling walker in June 2023; therefore, he would have to pay out of pocket. Patient declined Rollator.  Final next level of care: Home/Self Care Barriers to Discharge: Continued Medical Work up   Patient Goals and CMS Choice Patient states their goals for this hospitalization and ongoing recovery are:: Return home   Social Determinants of Health (SDOH) Interventions Housing Interventions: Intervention Not Indicated Transportation Interventions: Intervention Not Indicated Alcohol Usage Interventions: Alohol Education/Brief Advice   Readmission Risk Interventions     No data to display

## 2022-10-03 NOTE — Anesthesia Postprocedure Evaluation (Signed)
Anesthesia Post Note  [name]  Procedure(s) Performed: [place]     Patient location during evaluation: PACU Anesthesia Type: MAC Level of consciousness: awake and alert Pain management: pain level controlled Vital Signs Assessment: post-procedure vital signs reviewed and stable Respiratory status: spontaneous breathing, nonlabored ventilation, respiratory function stable and patient connected to nasal cannula oxygen Cardiovascular status: stable and blood pressure returned to baseline Postop Assessment: no apparent nausea or vomiting Anesthetic complications: no   No notable events documented.  Last Vitals:  Vitals:   10/03/22 1031 10/03/22 1033  BP: 122/77 124/79  Pulse:    Resp:    Temp:    SpO2:      Last Pain:  Vitals:   10/03/22 0813  TempSrc: Oral  PainSc:                  Mushka Laconte

## 2022-10-03 NOTE — Discharge Summary (Signed)
Physician Discharge Summary   [name] MRN: [id number] DOB: 1971-09-19  Admit date:     09/30/2022  Discharge date: 10/03/22  Discharge Physician: Marylu Lund   PCP: Nickola Major, NP   Recommendations at discharge:    Follow up with PCP in 1-2 weeks  Discharge Diagnoses: Principal Problem:   Colitis Active Problems:   Alcohol use disorder   CAD (coronary artery disease)   Chest pain with moderate risk for cardiac etiology   GI bleed   Hypokalemia   TOBACCO ABUSE   Cocaine abuse (HCC)   Chronic systolic CHF (congestive heart failure) (HCC)   Elevated troponin   Hyperlipidemia   Prolonged QT interval   Hematochezia   Abnormal CT scan, colon  Resolved Problems:   * No resolved hospital problems. [facility] Course: 51yo with hx cocaine and etoh abuse who presented with rectal bleeding and chest pains. GI was consulted and pt is now s/p flex sig with findings of hemorrhoids which were noted to be the source of rectal bleed with recs for anusol HC supp PR bid x 1 week then PR daily x 1 week. Cardiology was also consulted, pending stress testing. Pt found to have evidence of colitis on imaging  Assessment and Plan: Chest pain -Without chest pain this AM -Cardiology consulted and underwent low risk stress test 10/19 -Now on diltiazem  for antianginal effect -Pt to follow up closely with Cardiology on d/c   Colitis  -rocephin and flagyl started at time of presentation -evidence of colitis seen on CT -no leukocytosis -Complete course of omnicef and flagyl on d/c   Acute blood loss anemia secondary to hemorrhoids -Hgb stable -Now s/p flex sig with evidence of hemorrhoids -GI recs for anusol PR BID x 1 week followed by PR daily x 1 week   ETOH abuse -Cessation done at time of presentation -CIWA peaked to 13 -Remained stable on CIWA protocol   Cocaine abuse -cessation done this hospital visit   Hyponatremia -suspect secondary to ETOH  abuse -Na normalized this AM -repeat cmp in AM   Chronic systolic CHF -appears compensated this a M   Alcoholic fatty liver disease, seen on CT -recommend ETOH cessation          Consultants: Cardiology Procedures performed: Stress test  Disposition: Home Diet recommendation:  Cardiac diet DISCHARGE MEDICATION: Allergies as of 10/03/2022       Reactions   Penicillins Hives   Other Rash   chives        Medication List     STOP taking these medications    lisinopril 5 MG tablet Commonly known as: ZESTRIL   ondansetron 4 MG disintegrating tablet Commonly known as: Zofran ODT       TAKE these medications    albuterol (2.5 MG/3ML) 0.083% nebulizer solution Commonly known as: PROVENTIL Take 3 mLs (2.5 mg total) by nebulization every 6 (six) hours as needed for wheezing or shortness of breath.   aspirin 81 MG chewable tablet Chew 1 tablet (81 mg total) by mouth daily.   cefdinir 300 MG capsule Commonly known as: OMNICEF Take 1 capsule (300 mg total) by mouth 2 (two) times daily for 4 days.   diltiazem 30 MG tablet Commonly known as: CARDIZEM Take 1 tablet (30 mg total) by mouth every 12 (twelve) hours.   ezetimibe 10 MG tablet Commonly known as: ZETIA Take 1 tablet (10 mg total) by mouth daily. Start taking on: October 04, 2022  gabapentin 300 MG capsule Commonly known as: NEURONTIN Take 1 capsule (300 mg total) by mouth 3 (three) times daily.   hydrocortisone 25 MG suppository Commonly known as: ANUSOL-HC Place 1 suppository (25 mg total) rectally 2 (two) times daily for 7 days, THEN 1 suppository (25 mg total) daily for 7 days. Start taking on: October 03, 2022   methocarbamol 500 MG tablet Commonly known as: ROBAXIN Take 1 tablet (500 mg total) by mouth every 8 (eight) hours as needed for muscle spasms.   metroNIDAZOLE 500 MG tablet Commonly known as: Flagyl Take 1 tablet (500 mg total) by mouth 3 (three) times daily for 4 days.    pantoprazole 40 MG tablet Commonly known as: PROTONIX Take 1 tablet (40 mg total) by mouth daily.   rosuvastatin 40 MG tablet Commonly known as: CRESTOR Take 1 tablet (40 mg total) by mouth daily.               Durable Medical Equipment  (From admission, onward)           Start     Ordered   10/03/22 1527  For home use only DME Walker rolling  Once       Question Answer Comment  Walker: With 5 Inch Wheels   Patient needs a walker to treat with the following condition Weakness      10/03/22 [facility] Follow up.   Why: Rollator- will be delivered to the room. Contact information: [address] [place] [address] [phone]         Medina-Vargas, Monina C, NP Follow up in 2 week(s).   Specialty: Internal Medicine Why: Hospital follow up Contact information: [address] [address] [phone]                Discharge Exam: Danley Danker Weights   09/30/22 2218  Weight: 84.8 kg   General exam: Awake, laying in bed, in nad Respiratory system: Normal respiratory effort, no wheezing Cardiovascular system: regular rate, s1, s2 Gastrointestinal system: Soft, nondistended, positive BS Central nervous system: CN2-12 grossly intact, strength intact Extremities: Perfused, no clubbing Skin: Normal skin turgor, no notable skin lesions seen Psychiatry: Mood normal // no visual hallucinations    Condition at discharge: fair  The results of significant diagnostics from this hospitalization (including imaging, microbiology, ancillary and laboratory) are listed below for reference.   Imaging Studies: NM Myocar Multi W/Spect W/Wall Motion / EF  Result Date: 10/03/2022   The study is low risk.   Findings are consistent with prior myocardial infarction. No ischemia.   No ST deviation was noted.   LV perfusion is abnormal. There is no evidence of ischemia. There is no evidence of  infarction. Defect 1: There is a small defect with severe reduction in uptake present in the basal inferior location(s) that is fixed. There is abnormal wall motion in the defect area. Consistent with infarction.   Left ventricular function is abnormal. There was a single regional abnormality. Nuclear stress EF: 53 %. The left ventricular ejection fraction is mildly decreased (45-54%). End diastolic cavity size is normal. End systolic cavity size is normal.   ECHOCARDIOGRAM COMPLETE  Result Date: 10/01/2022    ECHOCARDIOGRAM REPORT   Patient Name:   ISAC LINCKS Date of Exam: 10/01/2022 Medical Rec #:  [id number]           Height:  65.0 in Accession #:    [id number]          Weight:       187.0 lb Date of Birth:  1971-09-06           BSA:          1.922 m Patient Age:    51 years            BP:           124/64 mmHg Patient Gender: M                   HR:           98 bpm. Exam Location:  Inpatient Procedure: 2D Echo, Cardiac Doppler, Color Doppler and Intracardiac            Opacification Agent Indications:    R07.9* Chest pain, unspecified. Elevated troponin  History:        Patient has prior history of Echocardiogram examinations, most                 recent 12/20/2020. Previous Myocardial Infarction; Risk                 Factors:Current Smoker and Dyslipidemia. Cocaine use. ETOH.  Sonographer:    Roseanna Rainbow RDCS Referring Phys: Sawyer  Sonographer Comments: Technically difficult study due to poor echo windows. IMPRESSIONS  1. Left ventricular ejection fraction, by estimation, is 60 to 65%. Left ventricular ejection fraction by 2D MOD biplane is 64.0 %. The left ventricle has normal function. The left ventricle has no regional wall motion abnormalities. The left ventricular internal cavity size was mildly dilated. Left ventricular diastolic parameters are consistent with Grade I diastolic dysfunction (impaired relaxation).  2. Right ventricular systolic function is normal. The right  ventricular size is normal.  3. Left atrial size was moderately dilated.  4. The mitral valve is grossly normal. Trivial mitral valve regurgitation.  5. The aortic valve is tricuspid. Aortic valve regurgitation is not visualized. Comparison(s): Changes from prior study are noted. 12/20/2020: LVEF 37-90%, normal diastolic function. FINDINGS  Left Ventricle: Left ventricular ejection fraction, by estimation, is 60 to 65%. Left ventricular ejection fraction by 2D MOD biplane is 64.0 %. The left ventricle has normal function. The left ventricle has no regional wall motion abnormalities. Definity contrast agent was given IV to delineate the left ventricular endocardial borders. The left ventricular internal cavity size was mildly dilated. There is no left ventricular hypertrophy. Left ventricular diastolic parameters are consistent with Grade I diastolic dysfunction (impaired relaxation). Indeterminate filling pressures. Right Ventricle: The right ventricular size is normal. No increase in right ventricular wall thickness. Right ventricular systolic function is normal. Left Atrium: Left atrial size was moderately dilated. Right Atrium: Right atrial size was normal in size. Pericardium: There is no evidence of pericardial effusion. Mitral Valve: The mitral valve is grossly normal. Trivial mitral valve regurgitation. Tricuspid Valve: The tricuspid valve is grossly normal. Tricuspid valve regurgitation is trivial. Aortic Valve: The aortic valve is tricuspid. Aortic valve regurgitation is not visualized. Pulmonic Valve: The pulmonic valve was normal in structure. Pulmonic valve regurgitation is not visualized. Aorta: The aortic root and ascending aorta are structurally normal, with no evidence of dilitation. IAS/Shunts: No atrial level shunt detected by color flow Doppler.  LEFT VENTRICLE PLAX 2D                        Biplane EF (  MOD) LVIDd:         5.55 cm         LV Biplane EF:   Left LVIDs:         4.05 cm                           ventricular LV PW:         1.00 cm                          ejection LV IVS:        1.00 cm                          fraction by LVOT diam:     2.70 cm                          2D MOD LV SV:         121                              biplane is LV SV Index:   63                               64.0 %. LVOT Area:     5.73 cm                                Diastology                                LV e' medial:    4.35 cm/s LV Volumes (MOD)               LV E/e' medial:  23.2 LV vol d, MOD    126.0 ml      LV e' lateral:   9.90 cm/s A2C:                           LV E/e' lateral: 10.2 LV vol d, MOD    141.0 ml A4C: LV vol s, MOD    42.1 ml A2C: LV vol s, MOD    56.1 ml A4C: LV SV MOD A2C:   83.9 ml LV SV MOD A4C:   141.0 ml LV SV MOD BP:    86.5 ml RIGHT VENTRICLE             IVC RV S prime:     11.40 cm/s  IVC diam: 1.90 cm TAPSE (M-mode): 1.9 cm LEFT ATRIUM             Index        RIGHT ATRIUM           Index LA diam:        3.80 cm 1.98 cm/m   RA Area:     16.30 cm LA Vol (A2C):   33.4 ml 17.37 ml/m  RA Volume:   36.20 ml  18.83 ml/m LA Vol (A4C):   76.6 ml 39.85 ml/m LA Biplane Vol: 50.6 ml 26.32 ml/m  AORTIC VALVE LVOT Vmax:   121.00 cm/s LVOT Vmean:  82.300 cm/s LVOT VTI:    0.211 m  AORTA Ao Root diam: 3.50 cm Ao Asc diam:  3.30 cm MITRAL VALVE MV Area (PHT): 5.32 cm     SHUNTS MV Decel Time: 143 msec     Systemic VTI:  0.21 m MV E velocity: 101.00 cm/s  Systemic Diam: 2.70 cm MV A velocity: 118.00 cm/s MV E/A ratio:  0.86 Lyman Bishop MD Electronically signed by Lyman Bishop MD Signature Date/Time: 10/01/2022/1:03:26 PM    Final    CT Angio Chest/Abd/Pel for Dissection W and/or Wo Contrast  Result Date: 09/30/2022 CLINICAL DATA:  Acute aortic syndrome suspected. Headache and rectal bleeding. Chest pain. EXAM: CT ANGIOGRAPHY CHEST, ABDOMEN AND PELVIS TECHNIQUE: Non-contrast CT of the chest was initially obtained. Multidetector CT imaging through the chest, abdomen and pelvis was performed  using the standard protocol during bolus administration of intravenous contrast. Multiplanar reconstructed images and MIPs were obtained and reviewed to evaluate the vascular anatomy. RADIATION DOSE REDUCTION: This exam was performed according to the departmental dose-optimization program which includes automated exposure control, adjustment of the mA and/or kV according to patient size and/or use of iterative reconstruction technique. CONTRAST:  52m OMNIPAQUE IOHEXOL 350 MG/ML SOLN COMPARISON:  CT angio chest, CT abdomen and pelvis 08/07/2021. FINDINGS: CTA CHEST FINDINGS Cardiovascular: Preferential opacification of the thoracic aorta. No evidence of thoracic aortic aneurysm or dissection. Normal heart size. No pericardial effusion. Coronary artery calcifications are present. There is enlargement of the main pulmonary artery which can be seen with pulmonary artery hypertension. There is no central pulmonary embolism. Mediastinum/Nodes: No enlarged mediastinal, hilar, or axillary lymph nodes. Thyroid gland, trachea, and esophagus demonstrate no significant findings. Lungs/Pleura: There is minimal linear scarring in the right lung apex. The lungs are otherwise clear. There is no pleural effusion or pneumothorax. There are minimal secretions in the left mainstem bronchus. Musculoskeletal: No chest wall abnormality. No acute or significant osseous findings. Review of the MIP images confirms the above findings. CTA ABDOMEN AND PELVIS FINDINGS VASCULAR Aorta: Normal caliber aorta without aneurysm, dissection, vasculitis or significant stenosis. Mild calcified atherosclerotic disease is seen throughout the aorta. Celiac: Severe focal stenosis at the origin of the celiac artery. Celiac artery and branches are otherwise within normal limits. SMA: Severe focal stenosis origin of the superior mesenteric artery. Superior mesenteric artery otherwise widely patent and within normal limits. Renals: Both renal arteries are  patent without evidence of aneurysm, dissection, vasculitis, fibromuscular dysplasia or significant stenosis. IMA: Patent without evidence of aneurysm, dissection, vasculitis or significant stenosis. Inflow: Patent without evidence of aneurysm, dissection, vasculitis or significant stenosis. Veins: No obvious venous abnormality within the limitations of this arterial phase study. Review of the MIP images confirms the above findings. NON-VASCULAR Hepatobiliary: Liver is mildly enlarged. No focal liver lesions are seen. No gallstones are identified. No biliary ductal dilatation. Pancreas: Unremarkable. No pancreatic ductal dilatation or surrounding inflammatory changes. Spleen: Mildly enlarged, unchanged. Adrenals/Urinary Tract: Adrenal glands are unremarkable. Kidneys are normal, without renal calculi, focal lesion, or hydronephrosis. Bladder is unremarkable. Stomach/Bowel: There is mild diffuse colonic wall thickening to the level of the rectum. There is mild surrounding inflammation involving the rectum and distal sigmoid colon. Appendix is not definitely seen. Small bowel and stomach are within normal limits. Lymphatic: No enlarged lymph nodes are identified. Reproductive: Prostate is unremarkable. Other: Small fat containing left inguinal hernia. There is no ascites. There is mild fat stranding and haziness in the retroperitoneum at the level of the inferior aorta and  IVC. This is new from prior. Musculoskeletal: No acute or significant osseous findings. Review of the MIP images confirms the above findings. IMPRESSION: 1. No evidence for aortic dissection or aneurysm. 2. Diffuse colonic wall thickening with mild surrounding inflammation compatible with nonspecific colitis. 3. New mild fat stranding and haziness in the retroperitoneum at the level of the inferior aorta and IVC. Findings may be related to infectious or inflammatory process. 4. Severe focal stenosis at the origin of the celiac artery and superior  mesenteric artery. 5. Stable hepatosplenomegaly. Electronically Signed   By: Ronney Asters M.D.   On: 09/30/2022 20:14   CT Head Wo Contrast  Result Date: 09/30/2022 CLINICAL DATA:  Headache EXAM: CT HEAD WITHOUT CONTRAST TECHNIQUE: Contiguous axial images were obtained from the base of the skull through the vertex without intravenous contrast. RADIATION DOSE REDUCTION: This exam was performed according to the departmental dose-optimization program which includes automated exposure control, adjustment of the mA and/or kV according to patient size and/or use of iterative reconstruction technique. COMPARISON:  08/19/2022 FINDINGS: Brain: No evidence of acute infarction, hemorrhage, hydrocephalus, extra-axial collection or mass lesion/mass effect. Mild subcortical white matter and periventricular small vessel ischemic changes. Vascular: Mild intracranial atherosclerosis. Skull: Normal. Negative for fracture or focal lesion. Sinuses/Orbits: The visualized paranasal sinuses are essentially clear. The mastoid air cells are unopacified. Other: None. IMPRESSION: No acute intracranial abnormality. Mild small vessel ischemic changes. Electronically Signed   By: Julian Hy M.D.   On: 09/30/2022 20:06   DG Chest 2 View  Result Date: 09/30/2022 CLINICAL DATA:  Chest pain, shortness of breath EXAM: CHEST - 2 VIEW COMPARISON:  Previous studies including the examination of 08/19/2022 FINDINGS: Transverse diameter of heart is increased. Increase in AP diameter of chest suggests COPD. There are no signs of pulmonary edema or focal pulmonary consolidation. There is no pleural effusion or pneumothorax. IMPRESSION: Cardiomegaly. There are no signs of pulmonary edema or focal pulmonary consolidation. Electronically Signed   By: Elmer Picker M.D.   On: 09/30/2022 12:21    Microbiology: Results for orders placed or performed during the hospital encounter of 09/30/22  Resp Panel by RT-PCR (Flu A&B, Covid)      Status: None   Collection Time: 09/30/22  6:29 PM   Specimen: Nasal Swab  Result Value Ref Range Status   SARS Coronavirus 2 by RT PCR NEGATIVE NEGATIVE Final    Comment: (NOTE) SARS-CoV-2 target nucleic acids are NOT DETECTED.  The SARS-CoV-2 RNA is generally detectable in upper respiratory specimens during the acute phase of infection. The lowest concentration of SARS-CoV-2 viral copies this assay can detect is 138 copies/mL. A negative result does not preclude SARS-Cov-2 infection and should not be used as the sole basis for treatment or other patient management decisions. A negative result may occur with  improper specimen collection/handling, submission of specimen other than nasopharyngeal swab, presence of viral mutation(s) within the areas targeted by this assay, and inadequate number of viral copies(<138 copies/mL). A negative result must be combined with clinical observations, patient history, and epidemiological information. The expected result is Negative.  Fact Sheet for Patients:  EntrepreneurPulse.com.au  Fact Sheet for Healthcare Providers:  IncredibleEmployment.be  This test is no t yet approved or cleared by the [place] FDA and  has been authorized for detection and/or diagnosis of SARS-CoV-2 by FDA under an Emergency Use Authorization (EUA). This EUA will remain  in effect (meaning this test can be used) for the duration of the COVID-19 declaration under  Section 564(b)(1) of the Act, 21 U.S.C.section 360bbb-3(b)(1), unless the authorization is terminated  or revoked sooner.       Influenza A by PCR NEGATIVE NEGATIVE Final   Influenza B by PCR NEGATIVE NEGATIVE Final    Comment: (NOTE) The Xpert Xpress SARS-CoV-2/FLU/RSV plus assay is intended as an aid in the diagnosis of influenza from Nasopharyngeal swab specimens and should not be used as a sole basis for treatment. Nasal washings and aspirates are unacceptable  for Xpert Xpress SARS-CoV-2/FLU/RSV testing.  Fact Sheet for Patients: EntrepreneurPulse.com.au  Fact Sheet for Healthcare Providers: IncredibleEmployment.be  This test is not yet approved or cleared by the [place] FDA and has been authorized for detection and/or diagnosis of SARS-CoV-2 by FDA under an Emergency Use Authorization (EUA). This EUA will remain in effect (meaning this test can be used) for the duration of the COVID-19 declaration under Section 564(b)(1) of the Act, 21 U.S.C. section 360bbb-3(b)(1), unless the authorization is terminated or revoked.  Performed at [facility], [address], [place], [place] [address]     Labs: CBC: Recent Labs  Lab 09/30/22 2225 10/01/22 0137 10/01/22 1205 10/01/22 1729 10/02/22 0013 10/03/22 0150  WBC 6.8 6.1 5.2 4.7 5.3 5.9  NEUTROABS 4.8  --   --   --   --   --   HGB 8.2* 8.3* 8.4* 8.2* 8.4* 7.9*  HCT 24.7* 25.2* 26.7* 26.1* 26.6* 24.6*  MCV 93.6 94.7 96.4 97.8 96.4 93.5  PLT 175 154 151 146* 152 149*   Basic Metabolic Panel: Recent Labs  Lab 09/30/22 1155 09/30/22 2225 10/01/22 0137 10/02/22 0440 10/03/22 0150  NA 137 133* 129* 135 136  K 3.3* 3.6 3.5 3.7 3.4*  CL 103 105 101 107 106  CO2 24 22 20* 22 22  GLUCOSE 128* 91 116* 124* 96  BUN <5* <5* <5* <5* <5*  CREATININE 0.62 0.61 0.72 0.68 0.81  CALCIUM 8.1* 7.8* 7.5* 8.0* 7.9*  MG  --  1.6* 1.6* 2.2 2.3  PHOS  --  2.1* 1.8* 2.9  --    Liver Function Tests: Recent Labs  Lab 09/30/22 1155 09/30/22 2225 10/01/22 0137 10/02/22 0440 10/03/22 0150  AST 103* 91* 90* 78* 62*  ALT 34 '31 29 26 23  '$ ALKPHOS 259* 249* 237* 237* 230*  BILITOT 1.4* 1.8* 1.9* 1.6* 1.5*  PROT 7.7 6.8 6.5 6.5 6.6  ALBUMIN 2.8* 2.4* 2.3* 2.3* 2.4*   CBG: Recent Labs  Lab 10/02/22 1011 10/02/22 1649 10/02/22 2110 10/03/22 0812 10/03/22 1208  GLUCAP 103* 147* 133* 77 152*    Discharge time spent: less than 30  minutes.  Signed: Marylu Lund, MD [facility] 10/03/2022

## 2022-10-03 NOTE — Progress Notes (Signed)
PT Cancellation Note  Patient Details Name: Ethan Wells MRN: [id number] DOB: 31-Oct-1971   Cancelled Treatment:    Reason Eval/Treat Not Completed: Patient at procedure or test/unavailable (stress test). Will follow-up for PT treatment this afternoon as schedule permits.  Mabeline Caras, PT, DPT Acute Rehabilitation Services  Personal: [facility] Office: [place] 10/03/2022, 11:06 AM

## 2022-10-04 ENCOUNTER — Telehealth: Payer: Self-pay

## 2022-10-04 NOTE — Patient Outreach (Signed)
  Care Coordination [facility] Note Transition Care Management Follow-up Telephone Call Date of discharge and from where: 10/03/22 [facility] Dx: Colitis How have you been since you were released from the hospital? Patient reports that he feels okay. He has not returned to "normal." Denies any diarrhea or n&v at present. States he ate last night was okay.  Any questions or concerns? Yes-patient upset that his Lisinopril was discharged per d/c summary and wants to know why. Also, feels like he was started on 'too many meds.' RN CM reviewed meds and purpose .He will discus with MD at follow up appt.  Items Reviewed: Did the pt receive and understand the discharge instructions provided? Yes  Medications obtained and verified? Yes  Other? Yes  Any new allergies since your discharge? No  Dietary orders reviewed? Yes Do you have support at home? Yes   Home Care and Equipment/Supplies: Were home health services ordered? not applicable If so, what is the name of the agency? N/A  Has the agency set up a time to come to the patient's home? not applicable Were any new equipment or medical supplies ordered?  No What is the name of the medical supply agency? N/A Were you able to get the supplies/equipment? not applicable Do you have any questions related to the use of the equipment or supplies? No  Functional Questionnaire: (I = Independent and D = Dependent) ADLs: I  Bathing/Dressing- I  Meal Prep- I  Eating- I  Maintaining continence- I  Transferring/Ambulation- I  Managing Meds- I  Follow up appointments reviewed:  PCP Hospital f/u appt confirmed? No patient has not made follow up appt-request sent to assist with scheduling. [facility] f/u appt confirmed? Yes  Scheduled to see Margaret Pyle on 10/18/22 @ 10:05am. Are transportation arrangements needed? No  If their condition worsens, is the pt aware to call PCP or go to the Emergency Dept.? Yes Was the patient provided with  contact information for the PCP's office or ED? Yes Was to pt encouraged to call back with questions or concerns? Yes  SDOH assessments and interventions completed:   Yes  Care Coordination Interventions Activated:  Yes   Care Coordination Interventions:  PCP follow up appointment requested     Encounter Outcome:  Pt. Visit Completed    Enzo Montgomery, RN,BSN,CCM [place] [facility] Telephonic Care Management Coordinator Direct Phone: [phone] Toll Free: [phone] Fax: [phone]

## 2022-10-06 ENCOUNTER — Encounter (HOSPITAL_COMMUNITY): Payer: Self-pay | Admitting: Gastroenterology

## 2022-10-17 ENCOUNTER — Ambulatory Visit (INDEPENDENT_AMBULATORY_CARE_PROVIDER_SITE_OTHER): Payer: Medicare HMO | Admitting: Adult Health

## 2022-10-17 ENCOUNTER — Encounter: Payer: Self-pay | Admitting: Adult Health

## 2022-10-17 ENCOUNTER — Telehealth: Payer: Self-pay | Admitting: *Deleted

## 2022-10-17 VITALS — BP 150/82 | HR 107 | Temp 99.6°F | Ht 65.0 in | Wt 180.2 lb

## 2022-10-17 DIAGNOSIS — F1911 Other psychoactive substance abuse, in remission: Secondary | ICD-10-CM

## 2022-10-17 DIAGNOSIS — I25118 Atherosclerotic heart disease of native coronary artery with other forms of angina pectoris: Secondary | ICD-10-CM

## 2022-10-17 DIAGNOSIS — D62 Acute posthemorrhagic anemia: Secondary | ICD-10-CM | POA: Diagnosis not present

## 2022-10-17 DIAGNOSIS — G629 Polyneuropathy, unspecified: Secondary | ICD-10-CM | POA: Diagnosis not present

## 2022-10-17 DIAGNOSIS — R6889 Other general symptoms and signs: Secondary | ICD-10-CM | POA: Diagnosis not present

## 2022-10-17 DIAGNOSIS — R197 Diarrhea, unspecified: Secondary | ICD-10-CM | POA: Diagnosis not present

## 2022-10-17 DIAGNOSIS — M62838 Other muscle spasm: Secondary | ICD-10-CM | POA: Diagnosis not present

## 2022-10-17 DIAGNOSIS — K529 Noninfective gastroenteritis and colitis, unspecified: Secondary | ICD-10-CM

## 2022-10-17 DIAGNOSIS — Z8719 Personal history of other diseases of the digestive system: Secondary | ICD-10-CM | POA: Diagnosis not present

## 2022-10-17 MED ORDER — GABAPENTIN 300 MG PO CAPS: 300.0000 mg | ORAL_CAPSULE | Freq: Three times a day (TID) | ORAL | 4 refills | Status: DC

## 2022-10-17 MED ORDER — METHOCARBAMOL 750 MG PO TABS: 750.0000 mg | ORAL_TABLET | Freq: Every day | ORAL | 0 refills | Status: DC | PRN

## 2022-10-17 MED ORDER — OYSTER SHELL CALCIUM/D3 500-5 MG-MCG PO TABS: 1.0000 | ORAL_TABLET | Freq: Two times a day (BID) | ORAL | 11 refills | Status: DC

## 2022-10-17 NOTE — Addendum Note (Signed)
Addended by: Durenda Age C on: 10/17/2022 04:38 PM   Modules accepted: Orders

## 2022-10-17 NOTE — Progress Notes (Addendum)
[facility] [place] [facility]    Provider:  Durenda Age DNP  Code Status: Full Code  Goals of Care:     09/30/2022    5:59 PM  Advanced Directives  Does Patient Have a Medical Advance Directive? Yes     Chief Complaint  Patient presents with   Hospitalization Follow-up    Pt was seen at ED    HPI: Patient is a 51 y.o. male seen today for hospital follow up. He has a PMH of CAD and cocaine/ETOH abuse. He was recently hospitalized 09/30/22 to 10/03/22 for chest pain, colitis and  acute blood loss due to anemia secondary to hemorrhoids. He denies having any bloody stools. He said that he still has diarrhea, 5X/day. He complains of having poor appetite. He continues to have chest pain and whole body spasm. He is currently on Gabapentin 300 mg TID and Robaxin 500 mg Q 8 hours PRN. He does not take his Diltiazem 30 mg Q12 hours because it makes him "feel bad". Today BP 150/82 and HR 107. He denies doing drugs. Ordered urine drug screen today but was not able to urinate enough. His ride showed up and agreed to do drug screen next visit. He continues to smoke cigarette a pack/ 2 days.    Wt Readings from Last 3 Encounters:  10/17/22 180 lb 3.2 oz (81.7 kg)  09/30/22 187 lb (84.8 kg)  09/23/22 187 lb 12.8 oz (85.2 kg)     Past Medical History:  Diagnosis Date   CAD (coronary artery disease)    a. 01/2011 Anterior STEMI/Cath/PCI: LM nl, LAD 100d (3.5x54m Vision BMS placed), LCX 269mRI 50, RCA min irregs, EF 40% w/ apical, inferoapical HK.   Cardiac arrest - ventricular fibrillation    a. In setting of STEMI 01/2011   Chronic pain    Cocaine abuse ([facility]   Depression with anxiety    ETOH abuse    a. 6-12 beers/day   GERD (gastroesophageal reflux disease)    Hemorrhoids    Hyperlipidemia    Hypertension    Ischemic cardiomyopathy    a. 06/2011 Echo: EF 45-50%, No rwma   Marijuana abuse    Migraines    Tobacco abuse    a. 1/2 ppd x 26 yrs    Past Surgical History:  Procedure  Laterality Date   COLONOSCOPY  08/09/2021   Procedure: COLONOSCOPY;  Surgeon: WoLucilla LameMD;  [facility];  Service: Endoscopy;;   CORONARY ANGIOPLASTY WITH STENT PLACEMENT     ESOPHAGOGASTRODUODENOSCOPY N/A 05/30/2022   Procedure: ESOPHAGOGASTRODUODENOSCOPY (EGD);  Surgeon: VaLin LandsmanMD;  [facility];  Service: Gastroenterology;  Laterality: N/A;   ESOPHAGOGASTRODUODENOSCOPY (EGD) WITH PROPOFOL N/A 03/01/2021   Procedure: ESOPHAGOGASTRODUODENOSCOPY (EGD) WITH PROPOFOL;  Surgeon: VaLin LandsmanMD;  [facility];  Laterality: N/A;   ESOPHAGOGASTRODUODENOSCOPY (EGD) WITH PROPOFOL N/A 08/09/2021   Procedure: ESOPHAGOGASTRODUODENOSCOPY (EGD) WITH PROPOFOL;  Surgeon: WoLucilla LameMD;  [facility];  Service: Endoscopy;  Laterality: N/A;   FLEXIBLE SIGMOIDOSCOPY N/A 10/02/2022   Procedure: FLEXIBLE SIGMOIDOSCOPY;  Surgeon: StLadene ArtistMD;  [facility];  Laterality: N/A;    Allergies  Allergen Reactions   Penicillins Hives   Other Rash    chives    Outpatient Encounter Medications as of 10/17/2022  Medication Sig   albuterol (PROVENTIL) (2.5 MG/3ML) 0.083% nebulizer solution Take 3 mLs (2.5 mg total) by nebulization every 6 (six) hours as needed for  wheezing or shortness of breath. (Patient not taking: Reported on 09/30/2022)   aspirin 81 MG chewable tablet Chew 1 tablet (81 mg total) by mouth daily.   diltiazem (CARDIZEM) 30 MG tablet Take 1 tablet (30 mg total) by mouth every 12 (twelve) hours.   ezetimibe (ZETIA) 10 MG tablet Take 1 tablet (10 mg total) by mouth daily.   gabapentin (NEURONTIN) 300 MG capsule Take 1 capsule (300 mg total) by mouth 3 (three) times daily.   hydrocortisone (ANUSOL-HC) 25 MG suppository Place 1 suppository (25 mg total) rectally 2 (two) times daily for 7 days, THEN 1 suppository (25 mg total) daily for 7 days.   methocarbamol  (ROBAXIN) 500 MG tablet Take 1 tablet (500 mg total) by mouth every 8 (eight) hours as needed for muscle spasms.   pantoprazole (PROTONIX) 40 MG tablet Take 1 tablet (40 mg total) by mouth daily.   rosuvastatin (CRESTOR) 40 MG tablet Take 1 tablet (40 mg total) by mouth daily.   No facility-administered encounter medications on file as of 10/17/2022.    Review of Systems:  Review of Systems  Constitutional:  Positive for appetite change. Negative for activity change and fever.  HENT:  Negative for sore throat.   Eyes: Negative.   Cardiovascular:  Negative for chest pain and leg swelling.  Gastrointestinal:  Positive for diarrhea. Negative for abdominal distention and vomiting.  Genitourinary:  Negative for dysuria, frequency and urgency.  Skin:  Negative for color change.  Neurological:  Negative for dizziness and headaches.  Psychiatric/Behavioral:  Negative for behavioral problems and sleep disturbance. The patient is not nervous/anxious.     Health Maintenance  Topic Date Due   Medicare Annual Wellness (AWV)  Never done   COVID-19 Vaccine (1) Never done   TETANUS/TDAP  Never done   Zoster Vaccines- Shingrix (1 of 2) Never done   INFLUENZA VACCINE  Never done   Lung Cancer Screening  10/01/2023   COLONOSCOPY (Pts 45-46yr Insurance coverage will need to be confirmed)  08/09/2028   Hepatitis C Screening  Completed   HIV Screening  Completed   HPV VACCINES  Aged Out    Physical Exam: Vitals:   10/17/22 0851  Height: _0  (1.651 m)   Body mass index is 31.12 kg/m. Physical Exam Constitutional:      General: He is not in acute distress.    Appearance: He is obese.  HENT:     Head: Normocephalic and atraumatic.     Mouth/Throat:     Mouth: Mucous membranes are moist.  Eyes:     Conjunctiva/sclera: Conjunctivae normal.  Cardiovascular:     Rate and Rhythm: Regular rhythm. Tachycardia present.     Pulses: Normal pulses.     Heart sounds: Normal heart sounds.   Pulmonary:     Effort: Pulmonary effort is normal.     Breath sounds: Normal breath sounds.  Abdominal:     General: Bowel sounds are normal.     Palpations: Abdomen is soft.  Musculoskeletal:        General: No swelling. Normal range of motion.     Cervical back: Normal range of motion.  Skin:    General: Skin is warm and dry.  Neurological:     General: No focal deficit present.     Mental Status: He is alert and oriented to person, place, and time.  Psychiatric:        Mood and Affect: Mood normal.        Behavior:  Behavior normal.        Thought Content: Thought content normal.        Judgment: Judgment normal.     Labs reviewed: Basic Metabolic Panel: Recent Labs    09/30/22 2225 10/01/22 0137 10/02/22 0440 10/03/22 0150  NA 133* 129* 135 136  K 3.6 3.5 3.7 3.4*  CL 105 101 107 106  CO2 22 20* 22 22  GLUCOSE 91 116* 124* 96  BUN <5* <5* <5* <5*  CREATININE 0.61 0.72 0.68 0.81  CALCIUM 7.8* 7.5* 8.0* 7.9*  MG 1.6* 1.6* 2.2 2.3  PHOS 2.1* 1.8* 2.9  --   TSH 1.197  --   --   --    Liver Function Tests: Recent Labs    10/01/22 0137 10/02/22 0440 10/03/22 0150  AST 90* 78* 62*  ALT _0 ALKPHOS 237* 237* 230*  BILITOT 1.9* 1.6* 1.5*  PROT 6.5 6.5 6.6  ALBUMIN 2.3* 2.3* 2.4*   Recent Labs    05/28/22 1819 08/19/22 1253 09/30/22 1155  LIPASE 34 29 40   Recent Labs    09/30/22 2225  AMMONIA 52*   CBC: Recent Labs    05/28/22 1415 05/29/22 0212 09/23/22 1034 09/30/22 1155 09/30/22 2225 10/01/22 0137 10/01/22 1729 10/02/22 0013 10/03/22 0150  WBC 6.0   < > 8.9   < > 6.8   < > 4.7 5.3 5.9  NEUTROABS 4.1  --  6,453  --  4.8  --   --   --   --   HGB 8.8*   < > 10.3*   < > 8.2*   < > 8.2* 8.4* 7.9*  HCT 27.7*   < > 29.5*   < > 24.7*   < > 26.1* 26.6* 24.6*  MCV 95.2   < > 89.7   < > 93.6   < > 97.8 96.4 93.5  PLT 186   < > 225   < > 175   < > 146* 152 139*   < > = values in this interval not displayed.   Lipid Panel: Recent Labs     09/23/22 1034 09/30/22 2225  CHOL 148 120  HDL 13* 20*  LDLCALC 110* 87  TRIG 131 64  CHOLHDL 11.4* 6.0   Lab Results  Component Value Date   HGBA1C 4.7 (L) 10/01/2022    Procedures since last visit: NM Myocar Multi W/Spect W/Wall Motion / EF  Result Date: 10/03/2022   The study is low risk.   Findings are consistent with prior myocardial infarction. No ischemia.   No ST deviation was noted.   LV perfusion is abnormal. There is no evidence of ischemia. There is no evidence of infarction. Defect 1: There is a small defect with severe reduction in uptake present in the basal inferior location(s) that is fixed. There is abnormal wall motion in the defect area. Consistent with infarction.   Left ventricular function is abnormal. There was a single regional abnormality. Nuclear stress EF: 53 %. The left ventricular ejection fraction is mildly decreased (45-54%). End diastolic cavity size is normal. End systolic cavity size is normal.   ECHOCARDIOGRAM COMPLETE  Result Date: 10/01/2022    ECHOCARDIOGRAM REPORT   Patient Name:   Ethan Wells Date of Exam: 10/01/2022 Medical Rec #:  [id number]           Height:       65.0 in Accession #:    [id number]  Weight:       187.0 lb Date of Birth:  1971-08-28           BSA:          1.922 m Patient Age:    51 years            BP:           124/64 mmHg Patient Gender: M                   HR:           98 bpm. Exam Location:  Inpatient Procedure: 2D Echo, Cardiac Doppler, Color Doppler and Intracardiac            Opacification Agent Indications:    R07.9* Chest pain, unspecified. Elevated troponin  History:        Patient has prior history of Echocardiogram examinations, most                 recent 12/20/2020. Previous Myocardial Infarction; Risk                 Factors:Current Smoker and Dyslipidemia. Cocaine use. ETOH.  Sonographer:    Roseanna Rainbow RDCS Referring Phys: [place]  Sonographer Comments: Technically difficult study due to  poor echo windows. IMPRESSIONS  1. Left ventricular ejection fraction, by estimation, is 60 to 65%. Left ventricular ejection fraction by 2D MOD biplane is 64.0 %. The left ventricle has normal function. The left ventricle has no regional wall motion abnormalities. The left ventricular internal cavity size was mildly dilated. Left ventricular diastolic parameters are consistent with Grade I diastolic dysfunction (impaired relaxation).  2. Right ventricular systolic function is normal. The right ventricular size is normal.  3. Left atrial size was moderately dilated.  4. The mitral valve is grossly normal. Trivial mitral valve regurgitation.  5. The aortic valve is tricuspid. Aortic valve regurgitation is not visualized. Comparison(s): Changes from prior study are noted. 12/20/2020: LVEF 99-37%, normal diastolic function. FINDINGS  Left Ventricle: Left ventricular ejection fraction, by estimation, is 60 to 65%. Left ventricular ejection fraction by 2D MOD biplane is 64.0 %. The left ventricle has normal function. The left ventricle has no regional wall motion abnormalities. Definity contrast agent was given IV to delineate the left ventricular endocardial borders. The left ventricular internal cavity size was mildly dilated. There is no left ventricular hypertrophy. Left ventricular diastolic parameters are consistent with Grade I diastolic dysfunction (impaired relaxation). Indeterminate filling pressures. Right Ventricle: The right ventricular size is normal. No increase in right ventricular wall thickness. Right ventricular systolic function is normal. Left Atrium: Left atrial size was moderately dilated. Right Atrium: Right atrial size was normal in size. Pericardium: There is no evidence of pericardial effusion. Mitral Valve: The mitral valve is grossly normal. Trivial mitral valve regurgitation. Tricuspid Valve: The tricuspid valve is grossly normal. Tricuspid valve regurgitation is trivial. Aortic Valve: The  aortic valve is tricuspid. Aortic valve regurgitation is not visualized. Pulmonic Valve: The pulmonic valve was normal in structure. Pulmonic valve regurgitation is not visualized. Aorta: The aortic root and ascending aorta are structurally normal, with no evidence of dilitation. IAS/Shunts: No atrial level shunt detected by color flow Doppler.  LEFT VENTRICLE PLAX 2D                        Biplane EF (MOD) LVIDd:         5.55 cm  LV Biplane EF:   Left LVIDs:         4.05 cm                          ventricular LV PW:         1.00 cm                          ejection LV IVS:        1.00 cm                          fraction by LVOT diam:     2.70 cm                          2D MOD LV SV:         121                              biplane is LV SV Index:   63                               64.0 %. LVOT Area:     5.73 cm                                Diastology                                LV e' medial:    4.35 cm/s LV Volumes (MOD)               LV E/e' medial:  23.2 LV vol d, MOD    126.0 ml      LV e' lateral:   9.90 cm/s A2C:                           LV E/e' lateral: 10.2 LV vol d, MOD    141.0 ml A4C: LV vol s, MOD    42.1 ml A2C: LV vol s, MOD    56.1 ml A4C: LV SV MOD A2C:   83.9 ml LV SV MOD A4C:   141.0 ml LV SV MOD BP:    86.5 ml RIGHT VENTRICLE             IVC RV S prime:     11.40 cm/s  IVC diam: 1.90 cm TAPSE (M-mode): 1.9 cm LEFT ATRIUM             Index        RIGHT ATRIUM           Index LA diam:        3.80 cm 1.98 cm/m   RA Area:     16.30 cm LA Vol (A2C):   33.4 ml 17.37 ml/m  RA Volume:   36.20 ml  18.83 ml/m LA Vol (A4C):   76.6 ml 39.85 ml/m LA Biplane Vol: 50.6 ml 26.32 ml/m  AORTIC VALVE LVOT Vmax:   121.00 cm/s LVOT Vmean:  82.300 cm/s LVOT VTI:    0.211 m  AORTA Ao Root diam: 3.50 cm Ao Asc diam:  3.30 cm MITRAL VALVE MV Area (PHT): 5.32 cm     SHUNTS MV Decel Time: 143 msec     Systemic VTI:  0.21 m MV E velocity: 101.00 cm/s  Systemic Diam: 2.70 cm MV A velocity: 118.00 cm/s  MV E/A ratio:  0.86 Lyman Bishop MD Electronically signed by Lyman Bishop MD Signature Date/Time: 10/01/2022/1:03:26 PM    Final    CT Angio Chest/Abd/Pel for Dissection W and/or Wo Contrast  Result Date: 09/30/2022 CLINICAL DATA:  Acute aortic syndrome suspected. Headache and rectal bleeding. Chest pain. EXAM: CT ANGIOGRAPHY CHEST, ABDOMEN AND PELVIS TECHNIQUE: Non-contrast CT of the chest was initially obtained. Multidetector CT imaging through the chest, abdomen and pelvis was performed using the standard protocol during bolus administration of intravenous contrast. Multiplanar reconstructed images and MIPs were obtained and reviewed to evaluate the vascular anatomy. RADIATION DOSE REDUCTION: This exam was performed according to the departmental dose-optimization program which includes automated exposure control, adjustment of the mA and/or kV according to patient size and/or use of iterative reconstruction technique. CONTRAST:  82m OMNIPAQUE IOHEXOL 350 MG/ML SOLN COMPARISON:  CT angio chest, CT abdomen and pelvis 08/07/2021. FINDINGS: CTA CHEST FINDINGS Cardiovascular: Preferential opacification of the thoracic aorta. No evidence of thoracic aortic aneurysm or dissection. Normal heart size. No pericardial effusion. Coronary artery calcifications are present. There is enlargement of the main pulmonary artery which can be seen with pulmonary artery hypertension. There is no central pulmonary embolism. Mediastinum/Nodes: No enlarged mediastinal, hilar, or axillary lymph nodes. Thyroid gland, trachea, and esophagus demonstrate no significant findings. Lungs/Pleura: There is minimal linear scarring in the right lung apex. The lungs are otherwise clear. There is no pleural effusion or pneumothorax. There are minimal secretions in the left mainstem bronchus. Musculoskeletal: No chest wall abnormality. No acute or significant osseous findings. Review of the MIP images confirms the above findings. CTA ABDOMEN  AND PELVIS FINDINGS VASCULAR Aorta: Normal caliber aorta without aneurysm, dissection, vasculitis or significant stenosis. Mild calcified atherosclerotic disease is seen throughout the aorta. Celiac: Severe focal stenosis at the origin of the celiac artery. Celiac artery and branches are otherwise within normal limits. SMA: Severe focal stenosis origin of the superior mesenteric artery. Superior mesenteric artery otherwise widely patent and within normal limits. Renals: Both renal arteries are patent without evidence of aneurysm, dissection, vasculitis, fibromuscular dysplasia or significant stenosis. IMA: Patent without evidence of aneurysm, dissection, vasculitis or significant stenosis. Inflow: Patent without evidence of aneurysm, dissection, vasculitis or significant stenosis. Veins: No obvious venous abnormality within the limitations of this arterial phase study. Review of the MIP images confirms the above findings. NON-VASCULAR Hepatobiliary: Liver is mildly enlarged. No focal liver lesions are seen. No gallstones are identified. No biliary ductal dilatation. Pancreas: Unremarkable. No pancreatic ductal dilatation or surrounding inflammatory changes. Spleen: Mildly enlarged, unchanged. Adrenals/Urinary Tract: Adrenal glands are unremarkable. Kidneys are normal, without renal calculi, focal lesion, or hydronephrosis. Bladder is unremarkable. Stomach/Bowel: There is mild diffuse colonic wall thickening to the level of the rectum. There is mild surrounding inflammation involving the rectum and distal sigmoid colon. Appendix is not definitely seen. Small bowel and stomach are within normal limits. Lymphatic: No enlarged lymph nodes are identified. Reproductive: Prostate is unremarkable. Other: Small fat containing left inguinal hernia. There is no ascites. There is mild fat stranding and haziness in the retroperitoneum at the level of the inferior aorta and IVC. This is new from prior. Musculoskeletal: No acute  or significant osseous findings. Review of the MIP images confirms  the above findings. IMPRESSION: 1. No evidence for aortic dissection or aneurysm. 2. Diffuse colonic wall thickening with mild surrounding inflammation compatible with nonspecific colitis. 3. New mild fat stranding and haziness in the retroperitoneum at the level of the inferior aorta and IVC. Findings may be related to infectious or inflammatory process. 4. Severe focal stenosis at the origin of the celiac artery and superior mesenteric artery. 5. Stable hepatosplenomegaly. Electronically Signed   By: Ronney Asters M.D.   On: 09/30/2022 20:14   CT Head Wo Contrast  Result Date: 09/30/2022 CLINICAL DATA:  Headache EXAM: CT HEAD WITHOUT CONTRAST TECHNIQUE: Contiguous axial images were obtained from the base of the skull through the vertex without intravenous contrast. RADIATION DOSE REDUCTION: This exam was performed according to the departmental dose-optimization program which includes automated exposure control, adjustment of the mA and/or kV according to patient size and/or use of iterative reconstruction technique. COMPARISON:  08/19/2022 FINDINGS: Brain: No evidence of acute infarction, hemorrhage, hydrocephalus, extra-axial collection or mass lesion/mass effect. Mild subcortical white matter and periventricular small vessel ischemic changes. Vascular: Mild intracranial atherosclerosis. Skull: Normal. Negative for fracture or focal lesion. Sinuses/Orbits: The visualized paranasal sinuses are essentially clear. The mastoid air cells are unopacified. Other: None. IMPRESSION: No acute intracranial abnormality. Mild small vessel ischemic changes. Electronically Signed   By: Julian Hy M.D.   On: 09/30/2022 20:06   DG Chest 2 View  Result Date: 09/30/2022 CLINICAL DATA:  Chest pain, shortness of breath EXAM: CHEST - 2 VIEW COMPARISON:  Previous studies including the examination of 08/19/2022 FINDINGS: Transverse diameter of heart is  increased. Increase in AP diameter of chest suggests COPD. There are no signs of pulmonary edema or focal pulmonary consolidation. There is no pleural effusion or pneumothorax. IMPRESSION: Cardiomegaly. There are no signs of pulmonary edema or focal pulmonary consolidation. Electronically Signed   By: Elmer Picker M.D.   On: 09/30/2022 12:21    Assessment/Plan  1. ABLA (acute blood loss anemia) Lab Results  Component Value Date   WBC 5.9 10/03/2022   HGB 7.9 (L) 10/03/2022   HCT 24.6 (L) 10/03/2022   MCV 93.5 10/03/2022   PLT 139 (L) 10/03/2022   -   denies having bloody stools - CBC With Differential/Platelet  2. Hypocalcemia Lab Results  Component Value Date   CALCIUM 7.9 (L) 10/03/2022   PHOS 2.9 10/02/2022   -  instructed to start taking calcium supplementation - calcium-vitamin D (OSCAL WITH D) 500-5 MG-MCG tablet; Take 1 tablet by mouth 2 (two) times daily.  Dispense: 60 tablet; Refill: 11  3. Coronary artery disease involving native coronary artery of native heart with other form of angina pectoris ([place]) -  continue Diltiazem to which he agreed  -  continue Zetia and Rosuvastatin - CMP with eGFR([facility])  4. Colitis -  continue taking Cefdinir and Flagyl -  denies having abdominal pain  5. Neuropathy - gabapentin (NEURONTIN) 300 MG capsule; Take 1 capsule (300 mg total) by mouth 3 (three) times daily.  Dispense: 90 capsule; Refill: 4  6. History of GI bleed -  denies bloody stool  7. Muscle spasm -  requested to have higher dosage of Robaxin -  discontinue Robaxin 500 mg Q 8 hours PRN and start Robaxin 750 mg daily PRN - methocarbamol (ROBAXIN-750) 750 MG tablet; Take 1 tablet (750 mg total) by mouth daily as needed for muscle spasms.  Dispense: 30 tablet; Refill: 0  8. Diarrhea, unspecified type -   currently on Cefdinir  for colitis -  instructed to take probiotics - Clostridium difficile culture-fecal  9. History of drug abuse ([place]) -   was not able to  provide enough urine sample  -   transportation has arrived and has to go, agreed to do drug screen on next visit - Drug tox monitor 1 with Conf, oral Fld    Labs/tests ordered:  Drug tox monitor 1 with Conf, oral Fld, CBC and CMP  Next appt:  in 3 months

## 2022-10-17 NOTE — Telephone Encounter (Signed)
Spoke with patient and made him aware the blood took today hemolyzed. I scheduled him a lab appointment for 1:30 10/24/2022.

## 2022-10-17 NOTE — Patient Instructions (Signed)
Anemia  Anemia is a condition in which there are not enough red blood cells or hemoglobin in the blood. Hemoglobin is a substance in red blood cells that carries oxygen. When you do not have enough red blood cells or hemoglobin (are anemic), your body cannot get enough oxygen, and your organs may not work properly. As a result, you may feel very tired or have other problems. What are the causes? Common causes of anemia include: Excessive bleeding. Anemia can be caused by excessive bleeding inside or outside the body, including bleeding from the intestines or from heavy menstrual periods in females. Poor nutrition. Long-lasting (chronic) kidney, thyroid, and liver disease. Bone marrow disorders, spleen problems, and blood disorders. Cancer and treatments for cancer. Human immunodeficiency virus (HIV) and acquired immunodeficiency syndrome (AIDS). Infections, medicines, and autoimmune disorders that destroy red blood cells. What are the signs or symptoms? Symptoms of this condition include: Minor weakness. Dizziness. Headache, or difficulties concentrating and sleeping. Heartbeats that feel irregular or faster than normal (palpitations). Shortness of breath, especially with exercise. Pale skin, lips, and nails, or cold hands and feet. Upset stomach (indigestion) and nausea. Symptoms may occur suddenly or develop slowly. If your anemia is mild, you may not have symptoms. How is this diagnosed? This condition is diagnosed based on blood tests, your medical history, and a physical exam. In some cases, a test may be needed in which cells are removed from the soft tissue inside of a bone and looked at under a microscope (bone marrow biopsy). Your health care provider may also check your stool (feces) for blood and may do more testing to look for the cause of your bleeding. Other tests may include: Imaging tests, such as a CT scan or MRI. A procedure to see inside your esophagus and stomach  (endoscopy). The esophagus is the part of the body that moves food from your mouth to your stomach. A procedure to see inside your colon and rectum (colonoscopy). How is this treated? Treatment for this condition depends on the cause. If you continue to lose a lot of blood, you may need to be treated at a hospital. Treatment may include: Taking supplements of iron, vitamin B52, or folic acid. Taking a hormone medicine (erythropoietin) that can help to stimulate red blood cell growth. Receiving donated blood through an IV (blood transfusion). This may be needed if you lose a lot of blood. Making changes to your diet. Having surgery to remove your spleen. Follow these instructions at home: Take over-the-counter and prescription medicines only as told by your health care provider. Take supplements only as told by your health care provider. Follow any diet instructions that you were given by your health care provider. Keep all follow-up visits. Your health care provider will want to recheck your blood tests. Contact a health care provider if: You develop new bleeding anywhere in the body. You are very weak. Get help right away if: You are short of breath. You have pain in your abdomen or chest. You are dizzy or feel faint. You have trouble concentrating. You have bloody stools, black stools, or tarry stools. You vomit repeatedly or you vomit up blood. These symptoms may be an emergency. Get help right away. Call 911. Do not wait to see if the symptoms will go away. Do not drive yourself to the hospital. Summary Anemia is a condition in which you do not have enough red blood cells or enough of a substance in your red blood cells that carries oxygen.  Symptoms may occur suddenly or develop slowly. If your anemia is mild, you may not have symptoms. This condition is diagnosed with blood tests, a medical history, and a physical exam. Other tests may be needed. Treatment for this condition  depends on the cause of the anemia. This information is not intended to replace advice given to you by your health care provider. Make sure you discuss any questions you have with your health care provider. Document Revised: 02/25/2022 Document Reviewed: 02/25/2022 Elsevier Patient Education  2023 [employer]vier Inc.  

## 2022-10-18 ENCOUNTER — Ambulatory Visit: Payer: Medicare HMO | Admitting: Physician Assistant

## 2022-10-18 NOTE — Progress Notes (Signed)
WBC 14.0, elevated (normal 3.8 10.8), pls continue taking omnicef and Flagyl. Elevation of wbc might be due to colitis.

## 2022-10-21 ENCOUNTER — Other Ambulatory Visit: Payer: Self-pay

## 2022-10-21 ENCOUNTER — Emergency Department: Payer: Medicare HMO

## 2022-10-21 ENCOUNTER — Encounter: Payer: Self-pay | Admitting: Emergency Medicine

## 2022-10-21 ENCOUNTER — Inpatient Hospital Stay
Admission: EM | Admit: 2022-10-21 | Discharge: 2022-11-08 | DRG: 252 | Disposition: A | Discharge status: Home / Self Care | Payer: Medicare HMO | Attending: Hospitalist | Admitting: Hospitalist

## 2022-10-21 DIAGNOSIS — K760 Fatty (change of) liver, not elsewhere classified: Secondary | ICD-10-CM | POA: Diagnosis present

## 2022-10-21 DIAGNOSIS — K219 Gastro-esophageal reflux disease without esophagitis: Secondary | ICD-10-CM | POA: Diagnosis present

## 2022-10-21 DIAGNOSIS — D62 Acute posthemorrhagic anemia: Secondary | ICD-10-CM | POA: Diagnosis not present

## 2022-10-21 DIAGNOSIS — Z888 Allergy status to other drugs, medicaments and biological substances status: Secondary | ICD-10-CM

## 2022-10-21 DIAGNOSIS — I739 Peripheral vascular disease, unspecified: Secondary | ICD-10-CM | POA: Diagnosis present

## 2022-10-21 DIAGNOSIS — F1020 Alcohol dependence, uncomplicated: Secondary | ICD-10-CM | POA: Diagnosis not present

## 2022-10-21 DIAGNOSIS — I5022 Chronic systolic (congestive) heart failure: Secondary | ICD-10-CM | POA: Diagnosis not present

## 2022-10-21 DIAGNOSIS — R0602 Shortness of breath: Secondary | ICD-10-CM | POA: Diagnosis not present

## 2022-10-21 DIAGNOSIS — I959 Hypotension, unspecified: Secondary | ICD-10-CM | POA: Diagnosis not present

## 2022-10-21 DIAGNOSIS — R1013 Epigastric pain: Secondary | ICD-10-CM | POA: Diagnosis not present

## 2022-10-21 DIAGNOSIS — R768 Other specified abnormal immunological findings in serum: Secondary | ICD-10-CM | POA: Diagnosis not present

## 2022-10-21 DIAGNOSIS — I5043 Acute on chronic combined systolic (congestive) and diastolic (congestive) heart failure: Secondary | ICD-10-CM | POA: Diagnosis not present

## 2022-10-21 DIAGNOSIS — Z79899 Other long term (current) drug therapy: Secondary | ICD-10-CM

## 2022-10-21 DIAGNOSIS — I10 Essential (primary) hypertension: Secondary | ICD-10-CM | POA: Diagnosis present

## 2022-10-21 DIAGNOSIS — I214 Non-ST elevation (NSTEMI) myocardial infarction: Secondary | ICD-10-CM | POA: Diagnosis present

## 2022-10-21 DIAGNOSIS — Z811 Family history of alcohol abuse and dependence: Secondary | ICD-10-CM

## 2022-10-21 DIAGNOSIS — I110 Hypertensive heart disease with heart failure: Principal | ICD-10-CM | POA: Diagnosis present

## 2022-10-21 DIAGNOSIS — E785 Hyperlipidemia, unspecified: Secondary | ICD-10-CM | POA: Diagnosis present

## 2022-10-21 DIAGNOSIS — D509 Iron deficiency anemia, unspecified: Secondary | ICD-10-CM | POA: Diagnosis present

## 2022-10-21 DIAGNOSIS — R0689 Other abnormalities of breathing: Secondary | ICD-10-CM | POA: Diagnosis not present

## 2022-10-21 DIAGNOSIS — G894 Chronic pain syndrome: Secondary | ICD-10-CM | POA: Diagnosis present

## 2022-10-21 DIAGNOSIS — I5033 Acute on chronic diastolic (congestive) heart failure: Secondary | ICD-10-CM | POA: Diagnosis present

## 2022-10-21 DIAGNOSIS — E876 Hypokalemia: Secondary | ICD-10-CM | POA: Diagnosis present

## 2022-10-21 DIAGNOSIS — R1031 Right lower quadrant pain: Secondary | ICD-10-CM | POA: Diagnosis not present

## 2022-10-21 DIAGNOSIS — K648 Other hemorrhoids: Secondary | ICD-10-CM | POA: Diagnosis present

## 2022-10-21 DIAGNOSIS — E875 Hyperkalemia: Secondary | ICD-10-CM | POA: Diagnosis not present

## 2022-10-21 DIAGNOSIS — R9431 Abnormal electrocardiogram [ECG] [EKG]: Secondary | ICD-10-CM | POA: Diagnosis not present

## 2022-10-21 DIAGNOSIS — J9601 Acute respiratory failure with hypoxia: Secondary | ICD-10-CM | POA: Diagnosis not present

## 2022-10-21 DIAGNOSIS — R0603 Acute respiratory distress: Secondary | ICD-10-CM | POA: Diagnosis not present

## 2022-10-21 DIAGNOSIS — K700 Alcoholic fatty liver: Secondary | ICD-10-CM | POA: Diagnosis present

## 2022-10-21 DIAGNOSIS — I252 Old myocardial infarction: Secondary | ICD-10-CM

## 2022-10-21 DIAGNOSIS — F17210 Nicotine dependence, cigarettes, uncomplicated: Secondary | ICD-10-CM | POA: Diagnosis not present

## 2022-10-21 DIAGNOSIS — R0789 Other chest pain: Secondary | ICD-10-CM | POA: Diagnosis not present

## 2022-10-21 DIAGNOSIS — K551 Chronic vascular disorders of intestine: Secondary | ICD-10-CM | POA: Diagnosis not present

## 2022-10-21 DIAGNOSIS — I509 Heart failure, unspecified: Secondary | ICD-10-CM | POA: Diagnosis not present

## 2022-10-21 DIAGNOSIS — I2510 Atherosclerotic heart disease of native coronary artery without angina pectoris: Secondary | ICD-10-CM | POA: Diagnosis present

## 2022-10-21 DIAGNOSIS — I2489 Other forms of acute ischemic heart disease: Secondary | ICD-10-CM | POA: Diagnosis present

## 2022-10-21 DIAGNOSIS — R079 Chest pain, unspecified: Secondary | ICD-10-CM | POA: Diagnosis not present

## 2022-10-21 DIAGNOSIS — D649 Anemia, unspecified: Secondary | ICD-10-CM | POA: Diagnosis not present

## 2022-10-21 DIAGNOSIS — I25118 Atherosclerotic heart disease of native coronary artery with other forms of angina pectoris: Secondary | ICD-10-CM | POA: Diagnosis not present

## 2022-10-21 DIAGNOSIS — F1410 Cocaine abuse, uncomplicated: Secondary | ICD-10-CM | POA: Diagnosis present

## 2022-10-21 DIAGNOSIS — Z8674 Personal history of sudden cardiac arrest: Secondary | ICD-10-CM | POA: Diagnosis not present

## 2022-10-21 DIAGNOSIS — K649 Unspecified hemorrhoids: Secondary | ICD-10-CM | POA: Diagnosis not present

## 2022-10-21 DIAGNOSIS — I25119 Atherosclerotic heart disease of native coronary artery with unspecified angina pectoris: Secondary | ICD-10-CM | POA: Diagnosis present

## 2022-10-21 DIAGNOSIS — D500 Iron deficiency anemia secondary to blood loss (chronic): Secondary | ICD-10-CM | POA: Diagnosis not present

## 2022-10-21 DIAGNOSIS — F1029 Alcohol dependence with unspecified alcohol-induced disorder: Secondary | ICD-10-CM | POA: Diagnosis not present

## 2022-10-21 DIAGNOSIS — Z955 Presence of coronary angioplasty implant and graft: Secondary | ICD-10-CM | POA: Diagnosis not present

## 2022-10-21 DIAGNOSIS — Z880 Allergy status to penicillin: Secondary | ICD-10-CM

## 2022-10-21 DIAGNOSIS — R7989 Other specified abnormal findings of blood chemistry: Secondary | ICD-10-CM | POA: Diagnosis not present

## 2022-10-21 DIAGNOSIS — Z8249 Family history of ischemic heart disease and other diseases of the circulatory system: Secondary | ICD-10-CM

## 2022-10-21 DIAGNOSIS — F419 Anxiety disorder, unspecified: Secondary | ICD-10-CM | POA: Diagnosis present

## 2022-10-21 DIAGNOSIS — I255 Ischemic cardiomyopathy: Secondary | ICD-10-CM | POA: Diagnosis present

## 2022-10-21 DIAGNOSIS — R1030 Lower abdominal pain, unspecified: Secondary | ICD-10-CM | POA: Diagnosis not present

## 2022-10-21 DIAGNOSIS — K644 Residual hemorrhoidal skin tags: Secondary | ICD-10-CM | POA: Diagnosis not present

## 2022-10-21 DIAGNOSIS — F1090 Alcohol use, unspecified, uncomplicated: Secondary | ICD-10-CM | POA: Diagnosis not present

## 2022-10-21 DIAGNOSIS — I771 Stricture of artery: Secondary | ICD-10-CM | POA: Diagnosis not present

## 2022-10-21 DIAGNOSIS — K625 Hemorrhage of anus and rectum: Secondary | ICD-10-CM | POA: Diagnosis not present

## 2022-10-21 DIAGNOSIS — R001 Bradycardia, unspecified: Secondary | ICD-10-CM | POA: Diagnosis not present

## 2022-10-21 DIAGNOSIS — F17219 Nicotine dependence, cigarettes, with unspecified nicotine-induced disorders: Secondary | ICD-10-CM | POA: Diagnosis not present

## 2022-10-21 LAB — BASIC METABOLIC PANEL
Anion gap: 11 (ref 5–15)
BUN: <5 mg/dL — ABNORMAL LOW (ref 6–20)
CO2: 29 mmol/L (ref 22–32)
Calcium: 7.8 mg/dL — ABNORMAL LOW (ref 8.9–10.3)
Chloride: 93 mmol/L — ABNORMAL LOW (ref 98–111)
Creatinine, Ser: 0.61 mg/dL (ref 0.61–1.24)
GFR, Estimated: >60 mL/min (ref >60)
Glucose, Bld: 126 mg/dL — ABNORMAL HIGH (ref 70–99)
Potassium: 2.2 mmol/L — CL (ref 3.5–5.1)
Sodium: 133 mmol/L — ABNORMAL LOW (ref 135–145)

## 2022-10-21 LAB — TROPONIN I (HIGH SENSITIVITY)
Troponin I (High Sensitivity): 13 ng/L (ref <18)
Troponin I (High Sensitivity): 13 ng/L (ref <18)

## 2022-10-21 LAB — CBC
HCT: 26.3 % — ABNORMAL LOW (ref 39.0–52.0)
Hemoglobin: 8.8 g/dL — ABNORMAL LOW (ref 13.0–17.0)
MCH: 29.6 pg (ref 26.0–34.0)
MCHC: 33.5 g/dL (ref 30.0–36.0)
MCV: 88.6 fL (ref 80.0–100.0)
Platelets: 214 10*3/uL (ref 150–400)
RBC: 2.97 MIL/uL — ABNORMAL LOW (ref 4.22–5.81)
RDW: 19.4 % — ABNORMAL HIGH (ref 11.5–15.5)
WBC: 7 10*3/uL (ref 4.0–10.5)
nRBC: 0 % (ref 0.0–0.2)

## 2022-10-21 LAB — ETHANOL: Alcohol, Ethyl (B): 84 mg/dL — ABNORMAL HIGH (ref <10)

## 2022-10-21 LAB — MAGNESIUM: Magnesium: 1.7 mg/dL (ref 1.7–2.4)

## 2022-10-21 MED ORDER — ALBUTEROL SULFATE (2.5 MG/3ML) 0.083% IN NEBU: 2.5000 mg | INHALATION_SOLUTION | RESPIRATORY_TRACT | Status: DC | PRN

## 2022-10-21 MED ORDER — HYDROCODONE-ACETAMINOPHEN 5-325 MG PO TABS
1.0000 | ORAL_TABLET | ORAL | Status: DC | PRN
  Administered 2022-10-22 (×2): 2 via ORAL
  Administered 2022-10-22: 1 via ORAL
  Administered 2022-10-22 – 2022-10-27 (×28): 2 via ORAL
  Administered 2022-10-27: 1 via ORAL
  Administered 2022-10-27 – 2022-10-28 (×5): 2 via ORAL
  Filled 2022-10-21 (×24): qty 2
  Filled 2022-10-21: qty 1
  Filled 2022-10-21 (×12): qty 2

## 2022-10-21 MED ORDER — MAGNESIUM SULFATE 2 GM/50ML IV SOLN: 2.0000 g | Freq: Once | INTRAVENOUS | Status: DC

## 2022-10-21 MED ORDER — ACETAMINOPHEN 650 MG RE SUPP: 650.0000 mg | Freq: Four times a day (QID) | RECTAL | Status: DC | PRN

## 2022-10-21 MED ORDER — FOLIC ACID 1 MG PO TABS
1.0000 mg | ORAL_TABLET | Freq: Every day | ORAL | Status: DC
  Administered 2022-10-22 – 2022-11-08 (×17): 1 mg via ORAL
  Filled 2022-10-21 (×17): qty 1

## 2022-10-21 MED ORDER — THIAMINE MONONITRATE 100 MG PO TABS
100.0000 mg | ORAL_TABLET | Freq: Every day | ORAL | Status: DC
  Administered 2022-10-22 – 2022-11-08 (×17): 100 mg via ORAL
  Filled 2022-10-21 (×17): qty 1

## 2022-10-21 MED ORDER — ASPIRIN 81 MG PO CHEW
324.0000 mg | CHEWABLE_TABLET | Freq: Once | ORAL | Status: AC
  Administered 2022-10-21: 324 mg via ORAL
  Filled 2022-10-21: qty 4

## 2022-10-21 MED ORDER — POTASSIUM CHLORIDE 20 MEQ PO PACK
40.0000 meq | PACK | Freq: Once | ORAL | Status: AC
  Administered 2022-10-21: 40 meq via ORAL
  Filled 2022-10-21: qty 2

## 2022-10-21 MED ORDER — CALCIUM GLUCONATE-NACL 1-0.675 GM/50ML-% IV SOLN
1.0000 g | Freq: Once | INTRAVENOUS | Status: AC
  Administered 2022-10-21: 1000 mg via INTRAVENOUS
  Filled 2022-10-21: qty 50

## 2022-10-21 MED ORDER — ACETAMINOPHEN 325 MG PO TABS: 650.0000 mg | ORAL_TABLET | Freq: Four times a day (QID) | ORAL | Status: DC | PRN

## 2022-10-21 MED ORDER — POTASSIUM CHLORIDE 10 MEQ/100ML IV SOLN
10.0000 meq | INTRAVENOUS | Status: DC
  Filled 2022-10-21: qty 100

## 2022-10-21 MED ORDER — MAGNESIUM SULFATE 2 GM/50ML IV SOLN
2.0000 g | Freq: Once | INTRAVENOUS | Status: AC
  Administered 2022-10-21: 2 g via INTRAVENOUS
  Filled 2022-10-21: qty 50

## 2022-10-21 MED ORDER — NICOTINE 14 MG/24HR TD PT24
14.0000 mg | MEDICATED_PATCH | Freq: Every day | TRANSDERMAL | Status: DC
  Administered 2022-10-22 – 2022-10-26 (×5): 14 mg via TRANSDERMAL
  Filled 2022-10-21 (×5): qty 1

## 2022-10-21 MED ORDER — POTASSIUM CHLORIDE 10 MEQ/100ML IV SOLN
10.0000 meq | INTRAVENOUS | Status: AC
  Administered 2022-10-21 – 2022-10-22 (×3): 10 meq via INTRAVENOUS
  Filled 2022-10-21 (×3): qty 100

## 2022-10-21 MED ORDER — HEPARIN SODIUM (PORCINE) 5000 UNIT/ML IJ SOLN
5000.0000 [IU] | Freq: Three times a day (TID) | INTRAMUSCULAR | Status: DC
  Administered 2022-10-22 – 2022-10-25 (×11): 5000 [IU] via SUBCUTANEOUS
  Filled 2022-10-21 (×11): qty 1

## 2022-10-21 MED ORDER — ADULT MULTIVITAMIN W/MINERALS CH
1.0000 | ORAL_TABLET | Freq: Every day | ORAL | Status: DC
  Administered 2022-10-22 – 2022-11-08 (×17): 1 via ORAL
  Filled 2022-10-21 (×17): qty 1

## 2022-10-21 MED ORDER — FENTANYL CITRATE PF 50 MCG/ML IJ SOSY
50.0000 ug | PREFILLED_SYRINGE | Freq: Once | INTRAMUSCULAR | Status: AC
  Administered 2022-10-21: 50 ug via INTRAVENOUS
  Filled 2022-10-21: qty 1

## 2022-10-21 MED ORDER — POLYETHYLENE GLYCOL 3350 17 G PO PACK: 17.0000 g | PACK | Freq: Every day | ORAL | Status: DC | PRN

## 2022-10-21 NOTE — ED Triage Notes (Signed)
Pt arrives from the [facility] via [facility], C/O CP of 9 left sided sharp and pressure radiating to bilateral arms, legs and back. Pt unable to tell when pain began.  Stats "I hurt all the time."  HX MI x 3, and stent.  Recent med change from 5 lisinopril to '30mg'$  of unknown med.  ASA 324 and 3 sprays nitro from EMS w/no relief.   Frequent PVCs per EMS. Pt states [ain continues/  respirations even and unlabored at this time,.

## 2022-10-21 NOTE — ED Provider Notes (Signed)
[facility] Provider Note    Event Date/Time   First MD Initiated Contact with Patient 10/21/22 1955     (approximate)   History   Chest Pain   HPI  Ethan Wells is a 51 y.o. male  with PMHx HTN, HLD, polysubstance abuse, CAD s/p STEMI and stenting, GI bleed, here with chest pain and weakness. Pt states that throughout the day today he has had aching, throbbing chest pain. He has had some SOB and lightheadedness with palpitations as well. Reports he has pain "always" but the CP is worse and similar to his prior cardiac pain. It radiates toward his bilateral shoulders and down his arms. No recent med changes and he has been taking his meds.       Physical Exam   Triage Vital Signs: ED Triage Vitals  Enc Vitals Group     BP 10/21/22 1951 (!) 100/54     Pulse Rate 10/21/22 1951 89     Resp 10/21/22 1951 18     Temp 10/21/22 1951 97.7 F (36.5 C)     Temp Source 10/21/22 1951 Oral     SpO2 10/21/22 1950 100 %     Weight 10/21/22 1952 180 lb 6.4 oz (81.8 kg)     Height 10/21/22 1952 '5\' 5"'$  (1.651 m)     Head Circumference --      Peak Flow --      Pain Score 10/21/22 1951 10     Pain Loc --      Pain Edu? --      Excl. in [place]? --     Most recent vital signs: Vitals:   10/21/22 2322 10/21/22 2323  BP:  (!) 100/59  Pulse:    Resp:    Temp: 98.2 F (36.8 C)   SpO2:       General: Awake, no distress.  CV:  Good peripheral perfusion. Tachycardic, ectopy noted. Resp:  Normal effort. Lungs clear. Abd:  No distention. No tenderness. Other:  1+ edema bilaterally   ED Results / Procedures / Treatments   Labs (all labs ordered are listed, but only abnormal results are displayed) Labs Reviewed  BASIC METABOLIC PANEL - Abnormal; Notable for the following components:      Result Value   Sodium 133 (*)    Potassium 2.2 (*)    Chloride 93 (*)    Glucose, Bld 126 (*)    BUN <5 (*)    Calcium 7.8 (*)    All other components within  normal limits  CBC - Abnormal; Notable for the following components:   RBC 2.97 (*)    Hemoglobin 8.8 (*)    HCT 26.3 (*)    RDW 19.4 (*)    All other components within normal limits  ETHANOL - Abnormal; Notable for the following components:   Alcohol, Ethyl (B) 84 (*)    All other components within normal limits  MAGNESIUM  COMPREHENSIVE METABOLIC PANEL  CBC  TROPONIN I (HIGH SENSITIVITY)  TROPONIN I (HIGH SENSITIVITY)     EKG Normal sinus rhythm, VR 85. PR 176, QRS 108, QTc 583. No acute St elevations or depressions. No ischemia or infarct.   RADIOLOGY CXR: Clear   I also independently reviewed and agree with radiologist interpretations.   PROCEDURES:  Critical Care performed: Yes, see critical care procedure note(s)  .Critical Care  Performed by: Duffy Bruce, MD Authorized by: Duffy Bruce, MD   Critical care provider statement:  Critical care time (minutes):  30   Critical care time was exclusive of:  Separately billable procedures and treating other patients   Critical care was necessary to treat or prevent imminent or life-threatening deterioration of the following conditions:  Respiratory failure, circulatory failure and cardiac failure   Critical care was time spent personally by me on the following activities:  Development of treatment plan with patient or surrogate, discussions with consultants, evaluation of patient's response to treatment, examination of patient, ordering and review of laboratory studies, ordering and review of radiographic studies, ordering and performing treatments and interventions, pulse oximetry, re-evaluation of patient's condition and review of old [place] ED: Medications  potassium chloride 10 mEq in 100 mL IVPB (10 mEq Intravenous New Bag/Given 10/21/22 2255)  heparin injection 5,000 Units (has no administration in time range)  acetaminophen (TYLENOL) tablet 650 mg (has no administration in time  range)    Or  acetaminophen (TYLENOL) suppository 650 mg (has no administration in time range)  HYDROcodone-acetaminophen (NORCO/VICODIN) 5-325 MG per tablet 1-2 tablet (has no administration in time range)  polyethylene glycol (MIRALAX / GLYCOLAX) packet 17 g (has no administration in time range)  folic acid (FOLVITE) tablet 1 mg (has no administration in time range)  multivitamin with minerals tablet 1 tablet (has no administration in time range)  thiamine (VITAMIN B1) tablet 100 mg (has no administration in time range)  albuterol (PROVENTIL) (5 MG/ML) 0.5% nebulizer solution 2.5 mg (has no administration in time range)  nicotine (NICODERM CQ - dosed in mg/24 hours) patch 14 mg (has no administration in time range)  potassium chloride 10 mEq in 100 mL IVPB (has no administration in time range)  potassium chloride (KLOR-CON) packet 40 mEq (40 mEq Oral Given 10/21/22 2135)  magnesium sulfate IVPB 2 g 50 mL (0 g Intravenous Stopped 10/21/22 2253)  calcium gluconate 1 g/ 50 mL sodium chloride IVPB (0 mg Intravenous Stopped 10/21/22 2253)  aspirin chewable tablet 324 mg (324 mg Oral Given 10/21/22 2135)  fentaNYL (SUBLIMAZE) injection 50 mcg (50 mcg Intravenous Given 10/21/22 2253)     IMPRESSION / MDM / [place] / ED COURSE  I reviewed the triage vital signs and the nursing notes.                               This patient presents to the ED for concern of chest pain, this involves an extensive number of treatment options, and is a complaint that carries with it a high risk of complications and morbidity.  The differential diagnosis includes: ACS, arrhythmia, CHF, PE, PNA, atelectasis, GERD, gastritis, pancreatitis   Co morbidities that complicate the patient evaluation  Alcohol use disorder HTN HLD Prolonged qt interval CAD s/p PCI   Additional history obtained:  Additional history obtained from records External records from outside source obtained and reviewed including  d/c summary 10/19 for chest pain, normal stress test at that time   Lab Tests:  I Ordered, and personally interpreted labs.  The pertinent results include:   Ethanol 84 Trop 13 K 2.2  Hgb 8.8, near baseline   Imaging Studies ordered:  I ordered imaging studies including CXR  I independently visualized and interpreted imaging which showed: Normal CXR I agree with the radiologist interpretation   Cardiac Monitoring: / EKG:  The patient was maintained on a cardiac monitor.  I personally viewed and interpreted the cardiac  monitored which showed an underlying rhythm of: Normal sinus rhythm with frequent PVCs  Problem List / ED Course / Critical interventions / Medication management  Chest pain Likely somewhat chronic, no signs of ACS currently - EKG nonischemic and trop is negative; recent negative nuclear study However ,K 2.2 with QTc>500 and PVCs - cannot rule out arrhythmia Will admit for management of severe hypokalemia with long QT, IV and PO replacement ordered I ordered medication including IV potassium, IV magnesium, PO potassium  for significant electrolyte disturbances  Reevaluation of the patient after these medicines showed that the patient stayed the same I have reviewed the patients home medicines and have made adjustments as needed   Social Determinants of Health:  Poor social situation, homeless Chronic polysubstance abuse   Test / Admission - Considered:  Admit to hospitalist   FINAL CLINICAL IMPRESSION(S) / ED DIAGNOSES   Final diagnoses:  None     Rx / DC Orders   ED Discharge Orders     None        Note:  This document was prepared using Dragon voice recognition software and may include unintentional dictation errors.   Duffy Bruce, MD 10/21/22 [phone]

## 2022-10-22 ENCOUNTER — Encounter: Payer: Self-pay | Admitting: Internal Medicine

## 2022-10-22 LAB — CBC WITH DIFFERENTIAL/PLATELET
Absolute Monocytes: 1274 cells/uL — ABNORMAL HIGH (ref 200–950)
Basophils Absolute: 126 cells/uL (ref 0–200)
Basophils Relative: 0.9 %
Eosinophils Absolute: 112 cells/uL (ref 15–500)
Eosinophils Relative: 0.8 %
HCT: 36.4 % — ABNORMAL LOW (ref 38.5–50.0)
Hemoglobin: 12.3 g/dL — ABNORMAL LOW (ref 13.2–17.1)
Lymphs Abs: 2002 cells/uL (ref 850–3900)
MCH: 30.8 pg (ref 27.0–33.0)
MCHC: 33.8 g/dL (ref 32.0–36.0)
MCV: 91 fL (ref 80.0–100.0)
MPV: 9.9 fL (ref 7.5–12.5)
Monocytes Relative: 9.1 %
Neutro Abs: 10486 cells/uL — ABNORMAL HIGH (ref 1500–7800)
Neutrophils Relative %: 74.9 %
Platelets: 356 10*3/uL (ref 140–400)
RBC: 4 10*6/uL — ABNORMAL LOW (ref 4.20–5.80)
RDW: 17.8 % — ABNORMAL HIGH (ref 11.0–15.0)
Total Lymphocyte: 14.3 %
WBC: 14 10*3/uL — ABNORMAL HIGH (ref 3.8–10.8)

## 2022-10-22 LAB — CLOSTRIDIUM DIFFICILE CULTURE-FECAL

## 2022-10-22 LAB — COMPREHENSIVE METABOLIC PANEL WITH GFR
ALT: 17 U/L (ref 0–44)
AST: 73 U/L — ABNORMAL HIGH (ref 15–41)
Albumin: 2.7 g/dL — ABNORMAL LOW (ref 3.5–5.0)
Alkaline Phosphatase: 249 U/L — ABNORMAL HIGH (ref 38–126)
Anion gap: 8 (ref 5–15)
BUN: <5 mg/dL — ABNORMAL LOW (ref 6–20)
CO2: 29 mmol/L (ref 22–32)
Calcium: 7.9 mg/dL — ABNORMAL LOW (ref 8.9–10.3)
Chloride: 100 mmol/L (ref 98–111)
Creatinine, Ser: 0.65 mg/dL (ref 0.61–1.24)
GFR, Estimated: >60 mL/min
Glucose, Bld: 116 mg/dL — ABNORMAL HIGH (ref 70–99)
Potassium: 2.8 mmol/L — ABNORMAL LOW (ref 3.5–5.1)
Sodium: 137 mmol/L (ref 135–145)
Total Bilirubin: 1.8 mg/dL — ABNORMAL HIGH (ref 0.3–1.2)
Total Protein: 6.8 g/dL (ref 6.5–8.1)

## 2022-10-22 LAB — CBC
HCT: 26.3 % — ABNORMAL LOW (ref 39.0–52.0)
Hemoglobin: 8.9 g/dL — ABNORMAL LOW (ref 13.0–17.0)
MCH: 30.6 pg (ref 26.0–34.0)
MCHC: 33.8 g/dL (ref 30.0–36.0)
MCV: 90.4 fL (ref 80.0–100.0)
Platelets: 183 10*3/uL (ref 150–400)
RBC: 2.91 MIL/uL — ABNORMAL LOW (ref 4.22–5.81)
RDW: 19.5 % — ABNORMAL HIGH (ref 11.5–15.5)
WBC: 6.5 10*3/uL (ref 4.0–10.5)
nRBC: 0 % (ref 0.0–0.2)

## 2022-10-22 MED ORDER — PANTOPRAZOLE SODIUM 40 MG PO TBEC
40.0000 mg | DELAYED_RELEASE_TABLET | Freq: Every day | ORAL | Status: DC
  Administered 2022-10-22 – 2022-10-25 (×4): 40 mg via ORAL
  Filled 2022-10-22 (×4): qty 1

## 2022-10-22 MED ORDER — OYSTER SHELL CALCIUM/D3 500-5 MG-MCG PO TABS
1.0000 | ORAL_TABLET | Freq: Two times a day (BID) | ORAL | Status: DC
  Administered 2022-10-22 – 2022-11-08 (×34): 1 via ORAL
  Filled 2022-10-22 (×35): qty 1

## 2022-10-22 MED ORDER — POTASSIUM CHLORIDE 10 MEQ/100ML IV SOLN
10.0000 meq | INTRAVENOUS | Status: AC
  Administered 2022-10-22: 10 meq via INTRAVENOUS
  Filled 2022-10-22: qty 100

## 2022-10-22 MED ORDER — LORAZEPAM 1 MG PO TABS
1.0000 mg | ORAL_TABLET | ORAL | Status: DC | PRN
  Administered 2022-10-22 – 2022-10-23 (×3): 1 mg via ORAL
  Filled 2022-10-22 (×3): qty 1

## 2022-10-22 MED ORDER — GABAPENTIN 300 MG PO CAPS
300.0000 mg | ORAL_CAPSULE | Freq: Three times a day (TID) | ORAL | Status: DC
  Administered 2022-10-22 – 2022-11-08 (×47): 300 mg via ORAL
  Filled 2022-10-22 (×49): qty 1

## 2022-10-22 MED ORDER — LORAZEPAM 2 MG/ML IJ SOLN: 1.0000 mg | INTRAMUSCULAR | Status: DC | PRN

## 2022-10-22 MED ORDER — POTASSIUM CHLORIDE 10 MEQ/100ML IV SOLN
10.0000 meq | INTRAVENOUS | Status: AC
  Administered 2022-10-22 (×3): 10 meq via INTRAVENOUS
  Filled 2022-10-22 (×2): qty 100

## 2022-10-22 MED ORDER — DILTIAZEM HCL 60 MG PO TABS
30.0000 mg | ORAL_TABLET | Freq: Two times a day (BID) | ORAL | Status: DC
  Administered 2022-10-22 – 2022-10-29 (×13): 30 mg via ORAL
  Filled 2022-10-22 (×2): qty 0.5
  Filled 2022-10-22 (×2): qty 1
  Filled 2022-10-22 (×3): qty 0.5
  Filled 2022-10-22: qty 1
  Filled 2022-10-22: qty 0.5
  Filled 2022-10-22: qty 1
  Filled 2022-10-22 (×3): qty 0.5
  Filled 2022-10-22: qty 1
  Filled 2022-10-22 (×2): qty 0.5

## 2022-10-22 MED ORDER — ROSUVASTATIN CALCIUM 10 MG PO TABS
40.0000 mg | ORAL_TABLET | Freq: Every day | ORAL | Status: DC
  Administered 2022-10-22 – 2022-11-08 (×17): 40 mg via ORAL
  Filled 2022-10-22 (×17): qty 4

## 2022-10-22 MED ORDER — POTASSIUM CHLORIDE CRYS ER 20 MEQ PO TBCR
60.0000 meq | EXTENDED_RELEASE_TABLET | Freq: Once | ORAL | Status: AC
  Administered 2022-10-22: 60 meq via ORAL
  Filled 2022-10-22: qty 3

## 2022-10-22 MED ORDER — OYSTER SHELL CALCIUM/D3 500-5 MG-MCG PO TABS: 1.0000 | ORAL_TABLET | Freq: Two times a day (BID) | ORAL | Status: DC

## 2022-10-22 MED ORDER — ASPIRIN 81 MG PO CHEW
81.0000 mg | CHEWABLE_TABLET | Freq: Every day | ORAL | Status: DC
  Administered 2022-10-22 – 2022-10-25 (×4): 81 mg via ORAL
  Filled 2022-10-22 (×4): qty 1

## 2022-10-22 MED ORDER — METHOCARBAMOL 500 MG PO TABS
750.0000 mg | ORAL_TABLET | Freq: Every day | ORAL | Status: DC | PRN
  Administered 2022-10-22 – 2022-10-25 (×3): 750 mg via ORAL
  Filled 2022-10-22 (×3): qty 2

## 2022-10-22 NOTE — H&P (Signed)
History and Physical    [name] ESP:[id number] DOB: 04-08-1971 DOA: 10/21/2022 DOS: the patient was seen and examined on 10/22/2022 PCP: Nickola Major, NP  Patient coming from: Home  Chief Complaint:  Chief Complaint  Patient presents with   Chest Pain   HPI: Ethan Wells is a 51 y.o. male with medical history significant of polysubstance abuse, coronary artery disease, anemia due to hemmoroids, QT prolongation chronic pain syndrome, who presents to the ER today on account of generalized body aches and pains.  At the ER he was found to have potassium of 2.2 subsequently started on IV potassium.  Patient is a poor historian. During my encounter patient was complaining of pain in his arm due to the IV potassium. He endorses recent use of cocaine and alcohol.  Labs concerning for hyponatremia sodium 133, potassium 2.2, magnesium 1.7, hemoglobin 8.8.  Chest x-ray shows no pneumonia, pneumothorax or pleural effusion.  EKG shows no acute ST-T wave changes, first 2 sets of high-sensitivity troponins were 13, 13  Review of Systems: As mentioned in the history of present illness. All other systems reviewed and are negative. Past Medical History:  Diagnosis Date   CAD (coronary artery disease)    a. 01/2011 Anterior STEMI/Cath/PCI: LM nl, LAD 100d (3.5x80m Vision BMS placed), LCX 241mRI 50, RCA min irregs, EF 40% w/ apical, inferoapical HK.   Cardiac arrest - ventricular fibrillation    a. In setting of STEMI 01/2011   Chronic pain    [place]   Depression with anxiety    ETOH abuse    a. 6-12 beers/day   GERD (gastroesophageal reflux disease)    Hemorrhoids    Hyperlipidemia    Hypertension    Ischemic cardiomyopathy    a. 06/2011 Echo: EF 45-50%, No rwma   Marijuana abuse    Migraines    Tobacco abuse    a. 1/2 ppd x 26 yrs   Past Surgical History:  Procedure Laterality Date   COLONOSCOPY  08/09/2021   Procedure: COLONOSCOPY;   Surgeon: WoLucilla LameMD;  [facility]  Service: Endoscopy;;   CORONARY ANGIOPLASTY WITH STENT PLACEMENT     ESOPHAGOGASTRODUODENOSCOPY N/A 05/30/2022   Procedure: ESOPHAGOGASTRODUODENOSCOPY (EGD);  Surgeon: VaLin LandsmanMD;  [facility];  Service: Gastroenterology;  Laterality: N/A;   ESOPHAGOGASTRODUODENOSCOPY (EGD) WITH PROPOFOL N/A 03/01/2021   Procedure: ESOPHAGOGASTRODUODENOSCOPY (EGD) WITH PROPOFOL;  Surgeon: VaLin LandsmanMD;  [facility] Service: Endoscopy;  Laterality: N/A;   ESOPHAGOGASTRODUODENOSCOPY (EGD) WITH PROPOFOL N/A 08/09/2021   Procedure: ESOPHAGOGASTRODUODENOSCOPY (EGD) WITH PROPOFOL;  Surgeon: WoLucilla LameMD;  [facility];  Service: Endoscopy;  Laterality: N/A;   FLEXIBLE SIGMOIDOSCOPY N/A 10/02/2022   Procedure: FLEXIBLE SIGMOIDOSCOPY;  Surgeon: StLadene ArtistMD;  [place] [facility]: [facility];  Laterality: N/A;   Social History:  reports that he has been smoking cigarettes. He has a 20.50 pack-year smoking history. He has never used smokeless tobacco. He reports current alcohol use of about 12.0 standard drinks of alcohol per week. He reports that he does not currently use drugs after having used the following drugs: Cocaine and Marijuana.  Allergies  Allergen Reactions   Penicillins Hives   Other Rash    chives    Family History  Problem Relation Age of Onset   Coronary artery disease Mother        alive   Other Other  2 sisters alive and well   Alcohol abuse Father        died @ 77   Other Paternal Grandmother        PACER    Prior to Admission medications   Medication Sig Start Date End Date Taking? Authorizing Provider  aspirin 81 MG chewable tablet Chew 1 tablet (81 mg total) by mouth daily. 06/01/22  Yes Sreenath, Trula Slade, MD  calcium-vitamin D (OSCAL WITH D) 500-5 MG-MCG tablet Take 1 tablet by mouth 2 (two) times daily. 10/17/22  Yes  Medina-Vargas, Monina C, NP  diltiazem (CARDIZEM) 30 MG tablet Take 1 tablet (30 mg total) by mouth every 12 (twelve) hours. 10/03/22 11/02/22 Yes Donne Hazel, MD  gabapentin (NEURONTIN) 300 MG capsule Take 1 capsule (300 mg total) by mouth 3 (three) times daily. 10/17/22  Yes Medina-Vargas, Monina C, NP  pantoprazole (PROTONIX) 40 MG tablet Take 1 tablet (40 mg total) by mouth daily. 09/23/22  Yes Medina-Vargas, Monina C, NP  rosuvastatin (CRESTOR) 40 MG tablet Take 1 tablet (40 mg total) by mouth daily. 09/23/22  Yes Medina-Vargas, Monina C, NP  methocarbamol (ROBAXIN-750) 750 MG tablet Take 1 tablet (750 mg total) by mouth daily as needed for muscle spasms. 10/17/22   Medina-Vargas, Senaida Lange, NP    Physical Exam:  Physical Exam Constitutional:      Appearance: He is well-developed.  HENT:     Head: Normocephalic and atraumatic.  Cardiovascular:     Rate and Rhythm: Normal rate and regular rhythm.  Pulmonary:     Effort: Pulmonary effort is normal.     Breath sounds: Normal breath sounds.  Abdominal:     General: Bowel sounds are normal.     Palpations: Abdomen is soft.  Musculoskeletal:        General: Normal range of motion.     Cervical back: Normal range of motion and neck supple.  Skin:    General: Skin is warm.     Capillary Refill: Capillary refill takes less than 2 seconds.  Neurological:     General: No focal deficit present.     Mental Status: He is alert.  Psychiatric:        Mood and Affect: Mood normal.     Vitals:   10/21/22 2252 10/21/22 2300 10/21/22 2322 10/21/22 2323  BP: (!) 103/59 (!) 100/59  (!) 100/59  Pulse: 86 80    Resp: 19     Temp:   98.2 F (36.8 C)   TempSrc:   Oral   SpO2: 97% 95%    Weight:      Height:       Data Reviewed:  Results are pending, will review when available.  Assessment and Plan: Severe hypokalemia-presents with potassium levels of 2.2, ER ordered 30 mEq of IV potassium, I have ordered additional 40 mEq.  Follow-up  repeat potassium in the morning.  Chest pain-currently does not have any chest pain,  low suspicion for ACS, EKG shows no acute ST wave changes, troponin negative.  Repeat EKG if chest pain reoccurs  Prolonged QT interval-avoid QT prolonging agents.  Replete electrolytes aggressively  Alcohol dependence-CIWA protocol ordered, thaimine and folate ordered.  Coronary artery disease-continue cardioprotective medications   Chronic diastolic dysfunction 2D echo reviewed patient with grade 1 diastolic dysfunction.  EF between 60 to 65%.  Hyperlipidemia-continue lipid-lowering medications.  Acute on chronic anemia secondary to hemorrhoids-hemoglobin at 8 previous admission patient was at 12.  Monitor trend.  Check iron profile  Transfuse if hemoglobin drops below 7 continue Protonix daily. Recently seen by GI  Alcoholic fatty liver disease-stable, platelets and INR within normal limits.  Advance Care Planning:   Code Status: Full Code   Consults:  Family Communication:  Severity of Illness: The appropriate patient status for this patient is OBSERVATION. Observation status is judged to be reasonable and necessary in order to provide the required intensity of service to ensure the patient's safety. The patient's presenting symptoms, physical exam findings, and initial radiographic and laboratory data in the context of their medical condition is felt to place them at decreased risk for further clinical deterioration. Furthermore, it is anticipated that the patient will be medically stable for discharge from the hospital within 2 midnights of admission.   Author: Cristela Felt, MD 10/22/2022 12:20 AM  For on call review [email or web address].

## 2022-10-22 NOTE — Progress Notes (Signed)
PROGRESS NOTE    HPI was taken from Dr.  Che: Ethan Wells is a 51 y.o. male with medical history significant of polysubstance abuse, coronary artery disease, anemia due to hemmoroids, QT prolongation chronic pain syndrome, who presents to the ER today on account of generalized body aches and pains.  At the ER he was found to have potassium of 2.2 subsequently started on IV potassium.  Patient is a poor historian. During my encounter patient was complaining of pain in his arm due to the IV potassium. He endorses recent use of cocaine and alcohol.   Labs concerning for hyponatremia sodium 133, potassium 2.2, magnesium 1.7, hemoglobin 8.8.   Chest x-ray shows no pneumonia, pneumothorax or pleural effusion.   EKG shows no acute ST-T wave changes, first 2 sets of high-sensitivity troponins were 13, 13   Ethan Wells  NGE:[id number] DOB: October 03, 1971 DOA: 10/21/2022 PCP: Nickola Major, NP   Assessment & Plan:   Principal Problem:   Hypokalemia  Assessment and Plan: Hypokalemia: potassium given. Will check Mg in AM    Chest pain: currently does not have any chest pain. Low suspicion for ACS, EKG shows no acute ST wave changes, troponin negative.    Alcohol dependence: continue on CIWA protocol. Continue on folate, thiamine. TOC consulted for alcohol abuse resources    Hx CAD: continue on aspirin, diltiazem, statin    Chronic diastolic CHF: echo shows EF 40-10%, grade I diastolic dysfunction. Monitor I/Os    HLD: continue on statin    Acute on chronic normocytic anemia: possibly secondary to hemorrhoids. No need for a transfusion currently   Prolonged QT interval: continue on tele    Hepatic steatosis: likely secondary to alcohol abuse.   Elevated alkaline phosphatase: etiology unclear. Possibly secondary to alcohol abuse. Will continue to monitor      DVT prophylaxis: heparin  Code Status: full  Family Communication: Disposition Plan:  likely d/c back  home   Level of care: Telemetry Medical  Status is: Inpatient Remains inpatient appropriate because: severity of illness    Consultants:    Procedures:   Antimicrobials:   Subjective: Pt c/o pain over his entire body   Objective: Vitals:   10/22/22 0252 10/22/22 0441 10/22/22 0559 10/22/22 0740  BP: (!) 105/57 120/60 (!) 102/53 (!) 104/53  Pulse: 87 77 85 76  Resp: '20 18 20 18  '$ Temp: 98.9 F (37.2 C) (!) 97.5 F (36.4 C) 98.4 F (36.9 C) 98.2 F (36.8 C)  TempSrc: Oral Oral Oral Oral  SpO2: 100% 99% 97% 98%  Weight: 84.5 kg     Height: '5\' 5"'$  (1.651 m)       Intake/Output Summary (Last 24 hours) at 10/22/2022 0745 Last data filed at 10/22/2022 0445 Gross per 24 hour  Intake 231.07 ml  Output 1200 ml  Net -968.93 ml   Filed Weights   10/21/22 1952 10/22/22 0252  Weight: 81.8 kg 84.5 kg    Examination:  General exam: Appears calm but uncomfortable. Disheveled  Respiratory system: Clear to auscultation. Respiratory effort normal. Cardiovascular system: S1 & S2+. No  rubs, gallops or clicks.  Gastrointestinal system: Abdomen is obese, soft and nontender.  Normal bowel sounds heard. Central nervous system: Alert and oriented. Moves all extremities  Psychiatry: Judgement and insight appear at baseline. Flat mood and affect    Data Reviewed: I have personally reviewed following labs and imaging studies  CBC: Recent Labs  Lab 10/17/22 1546 10/21/22 2000 10/22/22 0421  WBC 14.0*  7.0 6.5  NEUTROABS 10,486*  --   --   HGB 12.3* 8.8* 8.9*  HCT 36.4* 26.3* 26.3*  MCV 91.0 88.6 90.4  PLT 356 214 932   Basic Metabolic Panel: Recent Labs  Lab 10/21/22 2000 10/21/22 2131 10/22/22 0421  NA 133*  --  137  K 2.2*  --  2.8*  CL 93*  --  100  CO2 29  --  29  GLUCOSE 126*  --  116*  BUN <5*  --  <5*  CREATININE 0.61  --  0.65  CALCIUM 7.8*  --  7.9*  MG  --  1.7  --    GFR: Estimated Creatinine Clearance: 110.5 mL/min (by C-G formula based on SCr of  0.65 mg/dL). Liver Function Tests: Recent Labs  Lab 10/22/22 0421  AST 73*  ALT 17  ALKPHOS 249*  BILITOT 1.8*  PROT 6.8  ALBUMIN 2.7*   No results for input(s): "LIPASE", "AMYLASE" in the last 168 hours. No results for input(s): "AMMONIA" in the last 168 hours. Coagulation Profile: No results for input(s): "INR", "PROTIME" in the last 168 hours. Cardiac Enzymes: No results for input(s): "CKTOTAL", "CKMB", "CKMBINDEX", "TROPONINI" in the last 168 hours. BNP (last 3 results) No results for input(s): "PROBNP" in the last 8760 hours. HbA1C: No results for input(s): "HGBA1C" in the last 72 hours. CBG: No results for input(s): "GLUCAP" in the last 168 hours. Lipid Profile: No results for input(s): "CHOL", "HDL", "LDLCALC", "TRIG", "CHOLHDL", "LDLDIRECT" in the last 72 hours. Thyroid Function Tests: No results for input(s): "TSH", "T4TOTAL", "FREET4", "T3FREE", "THYROIDAB" in the last 72 hours. Anemia Panel: No results for input(s): "VITAMINB12", "FOLATE", "FERRITIN", "TIBC", "IRON", "RETICCTPCT" in the last 72 hours. Sepsis Labs: No results for input(s): "PROCALCITON", "LATICACIDVEN" in the last 168 hours.  No results found for this or any previous visit (from the past 240 hour(s)).       Radiology Studies: DG Chest Port 1 View  Result Date: 10/21/2022 CLINICAL DATA:  Chest pain EXAM: PORTABLE CHEST 1 VIEW COMPARISON:  09/30/2022 FINDINGS: Lungs are clear.  No pleural effusion or pneumothorax. The heart is top-normal in size. IMPRESSION: No evidence of acute cardiopulmonary disease. Electronically Signed   By: Julian Hy M.D.   On: 10/21/2022 20:38        Scheduled Meds:  aspirin  81 mg Oral Daily   calcium-vitamin D  1 tablet Oral BID   diltiazem  30 mg Oral T55D   folic acid  1 mg Oral Daily   gabapentin  300 mg Oral TID   heparin  5,000 Units Subcutaneous Q8H   multivitamin with minerals  1 tablet Oral Daily   nicotine  14 mg Transdermal Daily    pantoprazole  40 mg Oral Daily   rosuvastatin  40 mg Oral Daily   thiamine  100 mg Oral Daily   Continuous Infusions:  potassium chloride 10 mEq (10/22/22 0637)     LOS: 1 day    Time spent: 35 mins     Wyvonnia Dusky, MD [facility] Pager [phone]  If 7PM-7AM, please contact night-coverage [email or web address] 10/22/2022, 7:45 AM

## 2022-10-22 NOTE — TOC Initial Note (Signed)
Transition of Care [facility]) - Initial/Assessment Note    Patient Details  Name: Ethan Wells MRN: [id number] Date of Birth: 1971-06-01  Transition of Care [facility]) CM/SW Contact:    Candie Chroman, LCSW Phone Number: 10/22/2022, 10:50 AM  Clinical Narrative:  Readmission prevention screen complete. CSW met with patient. No supports at bedside. CSW introduced role and explained that discharge planning would be discussed. PCP is Durenda Age, NP. [employer]'s transport service takes him to appointments. [facility] [place]. No issues obtaining medications. SDOH flags for food and housing insecurity. Patient was staying in a camper on his friend's land but currently staying at the [facility]. Plan is to return there at discharge.  Patient reports he is looking for alternate housing. Gave information on shelter/food bank and [employer]. Patient said they will not give him housing due to pending court dates. No home health prior to admission. Patient was given a RW last admission but said it's too big. He wanted a rollator but said they would not give him one. Encouraged him to check local hospice thrift store as they may have one he could purchase. CSW consulted for SA resources. Patient is agreeable so provided inpatient and outpatient lists. No further concerns. CSW encouraged patient to contact CSW as needed. CSW will continue to follow patient for support and facilitate discharge once medically stable. Patient is unsure who will transport him back to [facility] at discharge.                Expected Discharge Plan:  [facility]) Barriers to Discharge: Continued Medical Work up   Patient Goals and CMS Choice        Expected Discharge Plan and Services Expected Discharge Plan:  [facility])     Post Acute Care Choice: NA Living arrangements for the past 2 months: Mobile Home, Hotel/Motel                                      Prior Living  Arrangements/Services Living arrangements for the past 2 months: Mobile Home, Hotel/Motel Lives with:: Self Patient language and need for interpreter reviewed:: Yes Do you feel safe going back to the place where you live?: Yes          Current home services: DME Criminal Activity/Legal Involvement Pertinent to Current Situation/Hospitalization: No - Comment as needed  Activities of Daily Living Home Assistive Devices/Equipment: Environmental consultant (specify type), Cane (specify quad or straight) ADL Screening (condition at time of admission) Patient's cognitive ability adequate to safely complete daily activities?: Yes Is the patient deaf or have difficulty hearing?: No Does the patient have difficulty seeing, even when wearing glasses/contacts?: No Does the patient have difficulty concentrating, remembering, or making decisions?: Yes Patient able to express need for assistance with ADLs?: Yes Does the patient have difficulty dressing or bathing?: No Independently performs ADLs?: Yes (appropriate for developmental age) Does the patient have difficulty walking or climbing stairs?: Yes Weakness of Legs: Both Weakness of Arms/Hands: Both  Permission Sought/Granted                  Emotional Assessment Appearance:: Appears stated age Attitude/Demeanor/Rapport: Engaged Affect (typically observed): Accepting Orientation: : Oriented to Self, Oriented to Place, Oriented to  Time, Oriented to Situation Alcohol / Substance Use: Alcohol Use, Illicit Drugs Psych Involvement: No (comment)  Admission diagnosis:  Hypokalemia [E87.6] Patient Active Problem  List   Diagnosis Date Noted   Hematochezia    Abnormal CT scan, colon    Colitis 09/30/2022   Prolonged QT interval 09/30/2022   Alcohol use disorder 08/19/2022   Acute on chronic blood loss anemia 05/29/2022   GI bleed 05/28/2022   Non-intractable vomiting    Epigastric abdominal pain    Polyp of ascending colon    ABLA (acute blood loss  anemia) 08/07/2021   Acute gastritis    Hyperbilirubinemia    Elevated troponin    Notalgia    NSTEMI (non-ST elevated myocardial infarction) ([place]) 12/19/2020   History of ST elevation myocardial infarction (STEMI) 12/19/2020   Alcohol use disorder, moderate, dependence ([place]) 12/19/2020   Depression with anxiety 10/05/2020   Esophageal reflux 10/05/2020   Headache 10/05/2020   Hyperlipidemia 10/05/2020   Hyponatremia 06/04/2020   Hypomagnesemia 00/71/2197   Chronic systolic CHF (congestive heart failure) ([place]) 06/04/2020   Leukocytosis 06/04/2020   Abdominal pain 06/04/2020   Hypokalemia 06/04/2020   Ischemic cardiomyopathy    Polysubstance abuse ([place])    Chest pain with moderate risk for cardiac etiology 07/12/2019   Abdominal pain, RUQ 05/25/2018   Chest pain, mid sternal 11/17/2012   Cocaine abuse ([place])    Marijuana abuse    TOBACCO ABUSE 02/26/2011   HYPERTENSION, BENIGN 02/26/2011   CAD (coronary artery disease) 02/26/2011   Other specified forms of chronic ischemic heart disease 02/26/2011   PCP:  Nickola Major, NP [id number] [place] [place] [address] [place]Phone: [phone]: [phone] [facility] [address]: [phone]: [phone]    Social Determinants of Health (SDOH) Interventions Food Insecurity Interventions: Other (Comment), Inpatient TOC (Gave information for food bank.) Housing Interventions: Inpatient TOC, Other (Comment) (Gave resources for shelter and housing authorities.)  Readmission Risk Interventions    10/22/2022   10:47 AM  Readmission Risk Prevention Plan  Transportation Screening Complete  PCP or Specialist Appt within 3-5 Days Complete  Social Work Consult for [place]/Counseling Complete  Palliative Care Screening Not Applicable  Medication Review (Press photographer  Complete

## 2022-10-22 NOTE — Progress Notes (Signed)
   10/22/22 0441  Assess: MEWS Score  Temp (!) 97.5 F (36.4 C)  BP 120/60  MAP (mmHg) 78  Pulse Rate 77  Resp 18  SpO2 99 %  O2 Device Room Air  Assess: MEWS Score  MEWS Temp 0  MEWS Systolic 0  MEWS Pulse 0  MEWS RR 0  MEWS LOC 0  MEWS Score 0  MEWS Score Color Green  Notify: Provider  Provider Name/Title Morton Amy NP  Date Provider Notified 10/22/22  Time Provider Notified [phone]  Method of Notification  (amion)  Notification Reason Other (Comment) (pt reported chest pain mid sternum and epigastric)  Provider response See new orders (EKG)  Date of Provider Response 10/22/22  Time of Provider Response 0433  Assess: SIRS CRITERIA  SIRS Temperature  0  SIRS Pulse 0  SIRS Respirations  0  SIRS WBC 1  SIRS Score Sum  1   EKG obtained and MD notified that flowed into epic. No new orders received after viewed.

## 2022-10-23 DIAGNOSIS — I25118 Atherosclerotic heart disease of native coronary artery with other forms of angina pectoris: Secondary | ICD-10-CM

## 2022-10-23 DIAGNOSIS — R079 Chest pain, unspecified: Secondary | ICD-10-CM | POA: Diagnosis not present

## 2022-10-23 DIAGNOSIS — F1029 Alcohol dependence with unspecified alcohol-induced disorder: Secondary | ICD-10-CM

## 2022-10-23 DIAGNOSIS — F1090 Alcohol use, unspecified, uncomplicated: Secondary | ICD-10-CM

## 2022-10-23 DIAGNOSIS — R1013 Epigastric pain: Secondary | ICD-10-CM | POA: Diagnosis not present

## 2022-10-23 DIAGNOSIS — K551 Chronic vascular disorders of intestine: Secondary | ICD-10-CM | POA: Diagnosis not present

## 2022-10-23 DIAGNOSIS — R9431 Abnormal electrocardiogram [ECG] [EKG]: Secondary | ICD-10-CM | POA: Diagnosis not present

## 2022-10-23 DIAGNOSIS — D500 Iron deficiency anemia secondary to blood loss (chronic): Secondary | ICD-10-CM

## 2022-10-23 DIAGNOSIS — E785 Hyperlipidemia, unspecified: Secondary | ICD-10-CM

## 2022-10-23 DIAGNOSIS — D509 Iron deficiency anemia, unspecified: Secondary | ICD-10-CM | POA: Diagnosis present

## 2022-10-23 DIAGNOSIS — E876 Hypokalemia: Secondary | ICD-10-CM | POA: Diagnosis not present

## 2022-10-23 DIAGNOSIS — F17210 Nicotine dependence, cigarettes, uncomplicated: Secondary | ICD-10-CM

## 2022-10-23 LAB — COMPREHENSIVE METABOLIC PANEL
ALT: 17 U/L (ref 0–44)
AST: 63 U/L — ABNORMAL HIGH (ref 15–41)
Albumin: 2.5 g/dL — ABNORMAL LOW (ref 3.5–5.0)
Alkaline Phosphatase: 233 U/L — ABNORMAL HIGH (ref 38–126)
Anion gap: 6 (ref 5–15)
BUN: <5 mg/dL — ABNORMAL LOW (ref 6–20)
CO2: 28 mmol/L (ref 22–32)
Calcium: 8.6 mg/dL — ABNORMAL LOW (ref 8.9–10.3)
Chloride: 105 mmol/L (ref 98–111)
Creatinine, Ser: 0.7 mg/dL (ref 0.61–1.24)
GFR, Estimated: >60 mL/min (ref >60)
Glucose, Bld: 118 mg/dL — ABNORMAL HIGH (ref 70–99)
Potassium: 3.1 mmol/L — ABNORMAL LOW (ref 3.5–5.1)
Sodium: 139 mmol/L (ref 135–145)
Total Bilirubin: 1.9 mg/dL — ABNORMAL HIGH (ref 0.3–1.2)
Total Protein: 6.5 g/dL (ref 6.5–8.1)

## 2022-10-23 LAB — CBC
HCT: 27.7 % — ABNORMAL LOW (ref 39.0–52.0)
Hemoglobin: 8.9 g/dL — ABNORMAL LOW (ref 13.0–17.0)
MCH: 30.1 pg (ref 26.0–34.0)
MCHC: 32.1 g/dL (ref 30.0–36.0)
MCV: 93.6 fL (ref 80.0–100.0)
Platelets: 158 10*3/uL (ref 150–400)
RBC: 2.96 MIL/uL — ABNORMAL LOW (ref 4.22–5.81)
RDW: 20.1 % — ABNORMAL HIGH (ref 11.5–15.5)
WBC: 5.3 10*3/uL (ref 4.0–10.5)
nRBC: 0 % (ref 0.0–0.2)

## 2022-10-23 LAB — MAGNESIUM: Magnesium: 1.9 mg/dL (ref 1.7–2.4)

## 2022-10-23 MED ORDER — KETOROLAC TROMETHAMINE 15 MG/ML IJ SOLN
15.0000 mg | Freq: Once | INTRAMUSCULAR | Status: AC
  Administered 2022-10-23: 15 mg via INTRAVENOUS
  Filled 2022-10-23: qty 1

## 2022-10-23 MED ORDER — POTASSIUM CHLORIDE CRYS ER 20 MEQ PO TBCR
40.0000 meq | EXTENDED_RELEASE_TABLET | Freq: Two times a day (BID) | ORAL | Status: DC
  Administered 2022-10-23 – 2022-10-24 (×4): 40 meq via ORAL
  Filled 2022-10-23 (×4): qty 2

## 2022-10-23 MED ORDER — METHYLPREDNISOLONE SODIUM SUCC 40 MG IJ SOLR
40.0000 mg | Freq: Every day | INTRAMUSCULAR | Status: DC
  Administered 2022-10-23 – 2022-10-25 (×3): 40 mg via INTRAVENOUS
  Filled 2022-10-23 (×3): qty 1

## 2022-10-23 MED ORDER — MAGNESIUM OXIDE -MG SUPPLEMENT 400 (240 MG) MG PO TABS
400.0000 mg | ORAL_TABLET | Freq: Two times a day (BID) | ORAL | Status: DC
  Administered 2022-10-23 – 2022-11-08 (×32): 400 mg via ORAL
  Filled 2022-10-23 (×32): qty 1

## 2022-10-23 NOTE — H&P (View-Only) (Signed)
[facility] Consult Note  MRN : [id number]  Ethan Wells is a 51 y.o. ([birth year]) male who presents with chief complaint of  Chief Complaint  Patient presents with   Chest Pain  .   Consulting Physician:Richard Wieting, MD Reason for consult: Epigastric pain with mesenteric stenosis History of Present Illness: Ethan Wells is a 51 year old male with a previous medical history significant of of polysubstance abuse, coronary artery disease, anemia, chronic pain syndrome who presents to [facility] following generalized body aches and pains.  The patient also previously had complaints of epigastric pain and chest pain as well.  At presentation to [facility] he was found to have a potassium of 2.2 which is subsequently risen to 3.1.  The patient is alert and oriented however he is a somewhat poor historian.  He does endorse having epigastric pain and symptoms and he endorses having difficulty with eating.  He denies nausea and vomiting.  He describes somewhat of a food phobia.  He notes that the pain is not always consistent with food however.  He does not note any recent weight loss.  The patient had a CT angio of his abdomen and pelvis that noted that he had significant stenosis of the SMA and celiac arteries.  Current Facility-Administered Medications  Medication Dose Route Frequency Provider Last Rate Last Admin   acetaminophen (TYLENOL) tablet 650 mg  650 mg Oral Q6H PRN Dibia, Manfred Shirts, MD       Or   acetaminophen (TYLENOL) suppository 650 mg  650 mg Rectal Q6H PRN Dibia, Manfred Shirts, MD       albuterol (PROVENTIL) (2.5 MG/3ML) 0.083% nebulizer solution 2.5 mg  2.5 mg Nebulization Q4H PRN Dibia, Manfred Shirts, MD       aspirin chewable tablet 81 mg  81 mg Oral Daily Dibia, Manfred Shirts, MD   81 mg at 10/23/22 1001   calcium-vitamin D (OSCAL WITH D) 500-5 MG-MCG per tablet 1 tablet  1 tablet Oral BID Dibia, Manfred Shirts,  MD   1 tablet at 10/23/22 1001   diltiazem (CARDIZEM) tablet 30 mg  30 mg Oral Q12H Dibia, Manfred Shirts, MD   30 mg at 16/60/63 0160   folic acid (FOLVITE) tablet 1 mg  1 mg Oral Daily Dibia, Manfred Shirts, MD   1 mg at 10/23/22 1001   gabapentin (NEURONTIN) capsule 300 mg  300 mg Oral TID Dibia, Manfred Shirts, MD   300 mg at 10/23/22 1001   heparin injection 5,000 Units  5,000 Units Subcutaneous Q8H Dibia, Manfred Shirts, MD   5,000 Units at 10/23/22 0554   HYDROcodone-acetaminophen (NORCO/VICODIN) 5-325 MG per tablet 1-2 tablet  1-2 tablet Oral Q4H PRN Dibia, Manfred Shirts, MD   2 tablet at 10/23/22 1001   LORazepam (ATIVAN) tablet 1-4 mg  1-4 mg Oral Q1H PRN Wyvonnia Dusky, MD   1 mg at 10/23/22 1236   magnesium oxide (MAG-OX) tablet 400 mg  400 mg Oral BID Loletha Grayer, MD   400 mg at 10/23/22 1001   methocarbamol (ROBAXIN) tablet 750 mg  750 mg Oral Daily PRN Dibia, Manfred Shirts, MD   750 mg at 10/22/22 0428   methylPREDNISolone sodium succinate (SOLU-MEDROL) 40 mg/mL injection 40 mg  40 mg Intravenous Daily Loletha Grayer, MD   40 mg at 10/23/22 1236   multivitamin with minerals tablet 1 tablet  1 tablet Oral Daily Dibia, Manfred Shirts, MD   1 tablet at 10/23/22 1001  nicotine (NICODERM CQ - dosed in mg/24 hours) patch 14 mg  14 mg Transdermal Daily Dibia, Manfred Shirts, MD   14 mg at 10/23/22 1000   pantoprazole (PROTONIX) EC tablet 40 mg  40 mg Oral Daily Dibia, Manfred Shirts, MD   40 mg at 10/23/22 1001   polyethylene glycol (MIRALAX / GLYCOLAX) packet 17 g  17 g Oral Daily PRN Dibia, Manfred Shirts, MD       potassium chloride SA (KLOR-CON M) CR tablet 40 mEq  40 mEq Oral BID Loletha Grayer, MD   40 mEq at 10/23/22 1001   rosuvastatin (CRESTOR) tablet 40 mg  40 mg Oral Daily Dibia, Manfred Shirts, MD   40 mg at 10/23/22 1001   thiamine (VITAMIN B1) tablet 100 mg  100 mg Oral Daily Dibia, Manfred Shirts, MD   100 mg at 10/23/22 1001    Past Medical History:  Diagnosis Date   CAD (coronary artery disease)    a. 01/2011  Anterior STEMI/Cath/PCI: LM nl, LAD 100d (3.5x21m Vision BMS placed), LCX 252mRI 50, RCA min irregs, EF 40% w/ apical, inferoapical HK.   Cardiac arrest - ventricular fibrillation    a. In setting of STEMI 01/2011   Chronic pain    [place]   Depression with anxiety    ETOH abuse    a. 6-12 beers/day   GERD (gastroesophageal reflux disease)    Hemorrhoids    Hyperlipidemia    Hypertension    Ischemic cardiomyopathy    a. 06/2011 Echo: EF 45-50%, No rwma   Marijuana abuse    Migraines    Tobacco abuse    a. 1/2 ppd x 26 yrs    Past Surgical History:  Procedure Laterality Date   COLONOSCOPY  08/09/2021   Procedure: COLONOSCOPY;  Surgeon: WoLucilla LameMD;  [facility];  Service: Endoscopy;;   CORONARY ANGIOPLASTY WITH STENT PLACEMENT     ESOPHAGOGASTRODUODENOSCOPY N/A 05/30/2022   Procedure: ESOPHAGOGASTRODUODENOSCOPY (EGD);  Surgeon: VaLin LandsmanMD;  [facility];  Service: Gastroenterology;  Laterality: N/A;   ESOPHAGOGASTRODUODENOSCOPY (EGD) WITH PROPOFOL N/A 03/01/2021   Procedure: ESOPHAGOGASTRODUODENOSCOPY (EGD) WITH PROPOFOL;  Surgeon: VaLin LandsmanMD;  [place];  Laterality: N/A;   ESOPHAGOGASTRODUODENOSCOPY (EGD) WITH PROPOFOL N/A 08/09/2021   Procedure: ESOPHAGOGASTRODUODENOSCOPY (EGD) WITH PROPOFOL;  Surgeon: WoLucilla LameMD;  [facility];  Service: Endoscopy;  Laterality: N/A;   FLEXIBLE SIGMOIDOSCOPY N/A 10/02/2022   Procedure: FLEXIBLE SIGMOIDOSCOPY;  Surgeon: StLadene ArtistMD;  [facility];  Laterality: N/A;    Social History Social History   Tobacco Use   Smoking status: Every Day    Packs/day: 0.50    Years: 41.00    Total pack years: 20.50    Types: Cigarettes   Smokeless tobacco: Never   Tobacco comments:    started age [age]Vaping Use   Vaping Use: Never used  Substance Use Topics   Alcohol use: Yes     Alcohol/week: 12.0 standard drinks of alcohol    Types: 12 Cans of beer per week    Comment: at least a 6 pack of beer daily and often a 12 pack   Drug use: Not Currently    Types: Cocaine, Marijuana    Family History Family History  Problem Relation Age of Onset   Coronary artery disease Mother        alive   Other Other  2 sisters alive and well   Alcohol abuse Father        died @ 4   Other Paternal Grandmother        PACER    Allergies  Allergen Reactions   Penicillins Hives   Other Rash    chives     REVIEW OF SYSTEMS (Negative unless checked)  Constitutional: '[]'$ Weight loss  '[]'$ Fever  '[]'$ Chills Cardiac: '[]'$ Chest pain   '[]'$ Chest pressure   '[]'$ Palpitations   '[]'$ Shortness of breath when laying flat   '[]'$ Shortness of breath at rest   '[]'$ Shortness of breath with exertion. Vascular:  '[]'$ Pain in legs with walking   '[]'$ Pain in legs at rest   '[]'$ Pain in legs when laying flat   '[]'$ Claudication   '[]'$ Pain in feet when walking  '[]'$ Pain in feet at rest  '[]'$ Pain in feet when laying flat   '[]'$ History of DVT   '[]'$ Phlebitis   '[]'$ Swelling in legs   '[]'$ Varicose veins   '[]'$ Non-healing ulcers Pulmonary:   '[]'$ Uses home oxygen   '[]'$ Productive cough   '[]'$ Hemoptysis   '[]'$ Wheeze  '[]'$ COPD   '[]'$ Asthma Neurologic:  '[]'$ Dizziness  '[]'$ Blackouts   '[]'$ Seizures   '[]'$ History of stroke   '[]'$ History of TIA  '[]'$ Aphasia   '[]'$ Temporary blindness   '[]'$ Dysphagia   '[]'$ Weakness or numbness in arms   '[]'$ Weakness or numbness in legs Musculoskeletal:  '[]'$ Arthritis   '[]'$ Joint swelling   '[]'$ Joint pain   '[]'$ Low back pain Hematologic:  '[]'$ Easy bruising  '[]'$ Easy bleeding   '[]'$ Hypercoagulable state   '[]'$ Anemic  '[]'$ Hepatitis Gastrointestinal:  '[]'$ Blood in stool   '[]'$ Vomiting blood  '[]'$ Gastroesophageal reflux/heartburn   '[]'$ Difficulty swallowing. Genitourinary:  '[]'$ Chronic kidney disease   '[]'$ Difficult urination  '[]'$ Frequent urination  '[]'$ Burning with urination   '[]'$ Blood in urine Skin:  '[]'$ Rashes   '[]'$ Ulcers   '[]'$ Wounds Psychological:  '[x]'$ History of anxiety   '[]'$  History of  major depression.  Physical Examination  Vitals:   10/22/22 1606 10/22/22 2035 10/23/22 0602 10/23/22 0742  BP: (!) 111/50 128/62 (!) 101/55 (!) 94/56  Pulse: 78 84 82 79  Resp: '18 18 20 20  '$ Temp: 97.9 F (36.6 C) 97.6 F (36.4 C) 97.7 F (36.5 C) (!) 97.5 F (36.4 C)  TempSrc: Oral Oral Oral Oral  SpO2: 98% 98% 98% 100%  Weight:      Height:       Body mass index is 31 kg/m. Gen:  WD/WN, NAD Head: [place]/AT, No temporalis wasting. Prominent temp pulse not noted. Ear/Nose/Throat: Hearing grossly intact, nares w/o erythema or drainage, oropharynx w/o Erythema/Exudate Eyes: Sclera non-icteric, conjunctiva clear Neck: Trachea midline.  No JVD.  Pulmonary:  Good air movement, respirations not labored, equal bilaterally.  Cardiac: RRR, normal S1, S2. Vascular:  Vessel Right Left  PT Palpable Palpable  DP Palpable Palpable   Gastrointestinal: soft, non-tender/non-distended. No guarding/reflex.  Musculoskeletal: M/S 5/5 throughout.  Extremities without ischemic changes.  No deformity or atrophy. No edema. Neurologic: Sensation grossly intact in extremities.  Symmetrical.  Speech is fluent. Motor exam as listed above. Psychiatric: Judgment intact, Mood & affect appropriate for pt's clinical situation. Dermatologic: No rashes or ulcers noted.  No cellulitis or open wounds. Lymph : No Cervical, Axillary, or Inguinal lymphadenopathy.    CBC Lab Results  Component Value Date   WBC 5.3 10/23/2022   HGB 8.9 (L) 10/23/2022   HCT 27.7 (L) 10/23/2022   MCV 93.6 10/23/2022   PLT 158 10/23/2022    BMET    Component Value Date/Time   NA 139 10/23/2022 0449  NA 134 (L) 09/06/2013 1458   K 3.1 (L) 10/23/2022 0449   K 3.5 09/06/2013 1458   CL 105 10/23/2022 0449   CL 102 09/06/2013 1458   CO2 28 10/23/2022 0449   CO2 23 09/06/2013 1458   GLUCOSE 118 (H) 10/23/2022 0449   GLUCOSE 167 (H) 09/06/2013 1458   BUN <5 (L) 10/23/2022 0449   BUN 4 (L) 09/06/2013 1458   CREATININE 0.70  10/23/2022 0449   CREATININE 0.49 (L) 09/23/2022 1034   CALCIUM 8.6 (L) 10/23/2022 0449   CALCIUM 8.9 09/06/2013 1458   GFRNONAA >60 10/23/2022 0449   GFRNONAA >60 09/06/2013 1458   GFRAA >60 06/05/2020 1109   GFRAA >60 09/06/2013 1458   Estimated Creatinine Clearance: 110.5 mL/min (by C-G formula based on SCr of 0.7 mg/dL).  COAG Lab Results  Component Value Date   INR 1.2 09/30/2022   INR 1.1 08/07/2021   INR 0.9 12/19/2020    Radiology DG Chest Port 1 View  Result Date: 10/21/2022 CLINICAL DATA:  Chest pain EXAM: PORTABLE CHEST 1 VIEW COMPARISON:  09/30/2022 FINDINGS: Lungs are clear.  No pleural effusion or pneumothorax. The heart is top-normal in size. IMPRESSION: No evidence of acute cardiopulmonary disease. Electronically Signed   By: Julian Hy M.D.   On: 10/21/2022 20:38   NM Myocar Multi W/Spect Tamela Oddi Motion / EF  Result Date: 10/03/2022   The study is low risk.   Findings are consistent with prior myocardial infarction. No ischemia.   No ST deviation was noted.   LV perfusion is abnormal. There is no evidence of ischemia. There is no evidence of infarction. Defect 1: There is a small defect with severe reduction in uptake present in the basal inferior location(s) that is fixed. There is abnormal wall motion in the defect area. Consistent with infarction.   Left ventricular function is abnormal. There was a single regional abnormality. Nuclear stress EF: 53 %. The left ventricular ejection fraction is mildly decreased (45-54%). End diastolic cavity size is normal. End systolic cavity size is normal.   ECHOCARDIOGRAM COMPLETE  Result Date: 10/01/2022    ECHOCARDIOGRAM REPORT   Patient Name:   KYE HEDDEN Date of Exam: 10/01/2022 Medical Rec #:  [id number]           Height:       65.0 in Accession #:    [id number]          Weight:       187.0 lb Date of Birth:  [birth year]           BSA:          1.922 m Patient Age:    62 years            BP:           124/64  mmHg Patient Gender: M                   HR:           98 bpm. Exam Location:  Inpatient Procedure: 2D Echo, Cardiac Doppler, Color Doppler and Intracardiac            Opacification Agent Indications:    R07.9* Chest pain, unspecified. Elevated troponin  History:        Patient has prior history of Echocardiogram examinations, most                 recent 12/20/2020. Previous Myocardial Infarction; Risk  Factors:Current Smoker and Dyslipidemia. Cocaine use. ETOH.  Sonographer:    Roseanna Rainbow RDCS Referring Phys: [place]  Sonographer Comments: Technically difficult study due to poor echo windows. IMPRESSIONS  1. Left ventricular ejection fraction, by estimation, is 60 to 65%. Left ventricular ejection fraction by 2D MOD biplane is 64.0 %. The left ventricle has normal function. The left ventricle has no regional wall motion abnormalities. The left ventricular internal cavity size was mildly dilated. Left ventricular diastolic parameters are consistent with Grade I diastolic dysfunction (impaired relaxation).  2. Right ventricular systolic function is normal. The right ventricular size is normal.  3. Left atrial size was moderately dilated.  4. The mitral valve is grossly normal. Trivial mitral valve regurgitation.  5. The aortic valve is tricuspid. Aortic valve regurgitation is not visualized. Comparison(s): Changes from prior study are noted. 12/20/2020: LVEF 05-69%, normal diastolic function. FINDINGS  Left Ventricle: Left ventricular ejection fraction, by estimation, is 60 to 65%. Left ventricular ejection fraction by 2D MOD biplane is 64.0 %. The left ventricle has normal function. The left ventricle has no regional wall motion abnormalities. Definity contrast agent was given IV to delineate the left ventricular endocardial borders. The left ventricular internal cavity size was mildly dilated. There is no left ventricular hypertrophy. Left ventricular diastolic parameters are consistent  with Grade I diastolic dysfunction (impaired relaxation). Indeterminate filling pressures. Right Ventricle: The right ventricular size is normal. No increase in right ventricular wall thickness. Right ventricular systolic function is normal. Left Atrium: Left atrial size was moderately dilated. Right Atrium: Right atrial size was normal in size. Pericardium: There is no evidence of pericardial effusion. Mitral Valve: The mitral valve is grossly normal. Trivial mitral valve regurgitation. Tricuspid Valve: The tricuspid valve is grossly normal. Tricuspid valve regurgitation is trivial. Aortic Valve: The aortic valve is tricuspid. Aortic valve regurgitation is not visualized. Pulmonic Valve: The pulmonic valve was normal in structure. Pulmonic valve regurgitation is not visualized. Aorta: The aortic root and ascending aorta are structurally normal, with no evidence of dilitation. IAS/Shunts: No atrial level shunt detected by color flow Doppler.  LEFT VENTRICLE PLAX 2D                        Biplane EF (MOD) LVIDd:         5.55 cm         LV Biplane EF:   Left LVIDs:         4.05 cm                          ventricular LV PW:         1.00 cm                          ejection LV IVS:        1.00 cm                          fraction by LVOT diam:     2.70 cm                          2D MOD LV SV:         121  biplane is LV SV Index:   63                               64.0 %. LVOT Area:     5.73 cm                                Diastology                                LV e' medial:    4.35 cm/s LV Volumes (MOD)               LV E/e' medial:  23.2 LV vol d, MOD    126.0 ml      LV e' lateral:   9.90 cm/s A2C:                           LV E/e' lateral: 10.2 LV vol d, MOD    141.0 ml A4C: LV vol s, MOD    42.1 ml A2C: LV vol s, MOD    56.1 ml A4C: LV SV MOD A2C:   83.9 ml LV SV MOD A4C:   141.0 ml LV SV MOD BP:    86.5 ml RIGHT VENTRICLE             IVC RV S prime:     11.40 cm/s  IVC diam: 1.90  cm TAPSE (M-mode): 1.9 cm LEFT ATRIUM             Index        RIGHT ATRIUM           Index LA diam:        3.80 cm 1.98 cm/m   RA Area:     16.30 cm LA Vol (A2C):   33.4 ml 17.37 ml/m  RA Volume:   36.20 ml  18.83 ml/m LA Vol (A4C):   76.6 ml 39.85 ml/m LA Biplane Vol: 50.6 ml 26.32 ml/m  AORTIC VALVE LVOT Vmax:   121.00 cm/s LVOT Vmean:  82.300 cm/s LVOT VTI:    0.211 m  AORTA Ao Root diam: 3.50 cm Ao Asc diam:  3.30 cm MITRAL VALVE MV Area (PHT): 5.32 cm     SHUNTS MV Decel Time: 143 msec     Systemic VTI:  0.21 m MV E velocity: 101.00 cm/s  Systemic Diam: 2.70 cm MV A velocity: 118.00 cm/s MV E/A ratio:  0.86 Lyman Bishop MD Electronically signed by Lyman Bishop MD Signature Date/Time: 10/01/2022/1:03:26 PM    Final    CT Angio Chest/Abd/Pel for Dissection W and/or Wo Contrast  Result Date: 09/30/2022 CLINICAL DATA:  Acute aortic syndrome suspected. Headache and rectal bleeding. Chest pain. EXAM: CT ANGIOGRAPHY CHEST, ABDOMEN AND PELVIS TECHNIQUE: Non-contrast CT of the chest was initially obtained. Multidetector CT imaging through the chest, abdomen and pelvis was performed using the standard protocol during bolus administration of intravenous contrast. Multiplanar reconstructed images and MIPs were obtained and reviewed to evaluate the vascular anatomy. RADIATION DOSE REDUCTION: This exam was performed according to the departmental dose-optimization program which includes automated exposure control, adjustment of the mA and/or kV according to patient size and/or use of iterative reconstruction technique. CONTRAST:  23m OMNIPAQUE IOHEXOL 350 MG/ML SOLN COMPARISON:  CT angio chest, CT  abdomen and pelvis 08/07/2021. FINDINGS: CTA CHEST FINDINGS Cardiovascular: Preferential opacification of the thoracic aorta. No evidence of thoracic aortic aneurysm or dissection. Normal heart size. No pericardial effusion. Coronary artery calcifications are present. There is enlargement of the main pulmonary artery  which can be seen with pulmonary artery hypertension. There is no central pulmonary embolism. Mediastinum/Nodes: No enlarged mediastinal, hilar, or axillary lymph nodes. Thyroid gland, trachea, and esophagus demonstrate no significant findings. Lungs/Pleura: There is minimal linear scarring in the right lung apex. The lungs are otherwise clear. There is no pleural effusion or pneumothorax. There are minimal secretions in the left mainstem bronchus. Musculoskeletal: No chest wall abnormality. No acute or significant osseous findings. Review of the MIP images confirms the above findings. CTA ABDOMEN AND PELVIS FINDINGS VASCULAR Aorta: Normal caliber aorta without aneurysm, dissection, vasculitis or significant stenosis. Mild calcified atherosclerotic disease is seen throughout the aorta. Celiac: Severe focal stenosis at the origin of the celiac artery. Celiac artery and branches are otherwise within normal limits. SMA: Severe focal stenosis origin of the superior mesenteric artery. Superior mesenteric artery otherwise widely patent and within normal limits. Renals: Both renal arteries are patent without evidence of aneurysm, dissection, vasculitis, fibromuscular dysplasia or significant stenosis. IMA: Patent without evidence of aneurysm, dissection, vasculitis or significant stenosis. Inflow: Patent without evidence of aneurysm, dissection, vasculitis or significant stenosis. Veins: No obvious venous abnormality within the limitations of this arterial phase study. Review of the MIP images confirms the above findings. NON-VASCULAR Hepatobiliary: Liver is mildly enlarged. No focal liver lesions are seen. No gallstones are identified. No biliary ductal dilatation. Pancreas: Unremarkable. No pancreatic ductal dilatation or surrounding inflammatory changes. Spleen: Mildly enlarged, unchanged. Adrenals/Urinary Tract: Adrenal glands are unremarkable. Kidneys are normal, without renal calculi, focal lesion, or hydronephrosis.  Bladder is unremarkable. Stomach/Bowel: There is mild diffuse colonic wall thickening to the level of the rectum. There is mild surrounding inflammation involving the rectum and distal sigmoid colon. Appendix is not definitely seen. Small bowel and stomach are within normal limits. Lymphatic: No enlarged lymph nodes are identified. Reproductive: Prostate is unremarkable. Other: Small fat containing left inguinal hernia. There is no ascites. There is mild fat stranding and haziness in the retroperitoneum at the level of the inferior aorta and IVC. This is new from prior. Musculoskeletal: No acute or significant osseous findings. Review of the MIP images confirms the above findings. IMPRESSION: 1. No evidence for aortic dissection or aneurysm. 2. Diffuse colonic wall thickening with mild surrounding inflammation compatible with nonspecific colitis. 3. New mild fat stranding and haziness in the retroperitoneum at the level of the inferior aorta and IVC. Findings may be related to infectious or inflammatory process. 4. Severe focal stenosis at the origin of the celiac artery and superior mesenteric artery. 5. Stable hepatosplenomegaly. Electronically Signed   By: Ronney Asters M.D.   On: 09/30/2022 20:14   CT Head Wo Contrast  Result Date: 09/30/2022 CLINICAL DATA:  Headache EXAM: CT HEAD WITHOUT CONTRAST TECHNIQUE: Contiguous axial images were obtained from the base of the skull through the vertex without intravenous contrast. RADIATION DOSE REDUCTION: This exam was performed according to the departmental dose-optimization program which includes automated exposure control, adjustment of the mA and/or kV according to patient size and/or use of iterative reconstruction technique. COMPARISON:  08/19/2022 FINDINGS: Brain: No evidence of acute infarction, hemorrhage, hydrocephalus, extra-axial collection or mass lesion/mass effect. Mild subcortical white matter and periventricular small vessel ischemic changes.  Vascular: Mild intracranial atherosclerosis. Skull: Normal. Negative for fracture  or focal lesion. Sinuses/Orbits: The visualized paranasal sinuses are essentially clear. The mastoid air cells are unopacified. Other: None. IMPRESSION: No acute intracranial abnormality. Mild small vessel ischemic changes. Electronically Signed   By: Julian Hy M.D.   On: 09/30/2022 20:06   DG Chest 2 View  Result Date: 09/30/2022 CLINICAL DATA:  Chest pain, shortness of breath EXAM: CHEST - 2 VIEW COMPARISON:  Previous studies including the examination of 08/19/2022 FINDINGS: Transverse diameter of heart is increased. Increase in AP diameter of chest suggests COPD. There are no signs of pulmonary edema or focal pulmonary consolidation. There is no pleural effusion or pneumothorax. IMPRESSION: Cardiomegaly. There are no signs of pulmonary edema or focal pulmonary consolidation. Electronically Signed   By: Elmer Picker M.D.   On: 09/30/2022 12:21      Assessment/Plan 1.  Mesenteric stenosis  The patient CT scan indicated evidence of significant, nearly occlusive disease of the SMA and celiac arteries.  Given that the patient has had frequent complaints of epigastric pain and discomfort, this is certainly a possible cause of the patient's ongoing pain and discomfort.  Because of this it would be prudent to move forward with a mesenteric angiogram to evaluate as well as possibly treat the stenoses.  If the patient does not have significant stenosis of both arteries, we will plan on treating the SMA with treatment of the celiac on an outpatient basis.  I discussed the risk, benefits and alternatives and the patient was agreeable to proceed.  We will plan on having the patient n.p.o. at midnight and moving forward on Thursday. 2.  Hyperlipidemia Continue statin and aspirin 3.  Tobacco use Cessation is advised  Plan of care discussed with Dr. Lucky Cowboy and he is in agreement with plan noted above.   Family  Communication:  Total Time:75 minutes  I spent 75 minutes in this encounter including personally reviewing extensive medical records, personally reviewing imaging studies and compared to prior scans, counseling the patient, placing orders, coordinating care and performing appropriate documentation  Thank you for allowing [place] to participate in the care of this patient.   Kris Hartmann, NP [facility] [phone] (Office Phone) [phone] (Office Fax) [phone] (Pager)  10/23/2022 1:58 PM  Staff may message me via secure chat in [place]  but this may not receive immediate response,  please page for urgent matters!  Dictation software was used to generate the above note. Typos may occur and escape review, as with typed/written notes. Any error is purely unintentional.  Please contact me directly for clarity if needed.

## 2022-10-23 NOTE — Assessment & Plan Note (Signed)
No signs of withdrawal.  Continue folate thiamine.

## 2022-10-23 NOTE — Assessment & Plan Note (Addendum)
Cardiac enzymes negative on admission.  Patient had a recent stress test that was a low risk scan.  Test was consistent with prior myocardial infarction.  No ischemia seen.  Repeat chest pain yesterday which also resulted in elevated troponin which peaked at 1600, concern of NSTEMI.  He was started on heparin infusion. Patient will need cardiac catheterization per cardiology but they would like to see stable hemoglobin and no more bleeding on heparin. -Continue with heparin -Continue with supportive care

## 2022-10-23 NOTE — Progress Notes (Signed)
Progress Note   [name] AUQ:[id number] DOB: 04-28-1971 DOA: 10/21/2022     2 DOS: the patient was seen and examined on 10/23/2022   Brief hospital course: KASRA MELVIN is a 51 y.o. male with medical history significant of polysubstance abuse, coronary artery disease, anemia due to hemmoroids, QT prolongation chronic pain syndrome, who presents to the ER today on account of generalized body aches and pains.  At the ER he was found to have potassium of 2.2 subsequently started on IV potassium.  Patient is a poor historian. During my encounter patient was complaining of pain in his arm due to the IV potassium. He endorses recent use of cocaine and alcohol.   Labs concerning for hyponatremia sodium 133, potassium 2.2, magnesium 1.7, hemoglobin 8.8.   Chest x-ray shows no pneumonia, pneumothorax or pleural effusion.   EKG shows no acute ST-T wave changes, first 2 sets of high-sensitivity troponins were 13, 13  Assessment and Plan: * Chest pain Cardiac enzymes negative.  Patient had a recent stress test that was a low risk scan.  Test was consistent with prior myocardial infarction.  No ischemia seen.  We will give a trial of steroid to see if this helps out his pains.  Check an ANA and rheumatoid factor.  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  Consulted vascular surgery for evaluation since this could be the cause of the patient's chest pain and epigastric pain.  Also reviewed endoscopy from 05/30/2022 showing esophagitis and portal hypertensive gastropathy and duodenitis.  We will continue empiric PPI.  CAD (coronary artery disease) On aspirin and Crestor  Hypokalemia Replace potassium and also magnesium.  Iron deficiency anemia Last hemoglobin 8.9.  Continue to monitor hemoglobin.  Prolonged QT interval Monitoring on telemetry  Alcohol dependence ([place]) No signs of  withdrawal.  Continue folate thiamine.        Subjective: Patient having chest pain and epigastric pain.  He has been taking BC powder at home.  He does take some gabapentin.  Nothing seems to be helping his pain.  Physical Exam: Vitals:   10/22/22 1606 10/22/22 2035 10/23/22 0602 10/23/22 0742  BP: (!) 111/50 128/62 (!) 101/55 (!) 94/56  Pulse: 78 84 82 79  Resp: '18 18 20 20  '$ Temp: 97.9 F (36.6 C) 97.6 F (36.4 C) 97.7 F (36.5 C) (!) 97.5 F (36.4 C)  TempSrc: Oral Oral Oral Oral  SpO2: 98% 98% 98% 100%  Weight:      Height:       Physical Exam HENT:     Head: Normocephalic.     Mouth/Throat:     Pharynx: No oropharyngeal exudate.  Eyes:     General: Lids are normal.     Conjunctiva/sclera: Conjunctivae normal.  Cardiovascular:     Rate and Rhythm: Normal rate and regular rhythm.     Heart sounds: Normal heart sounds, S1 normal and S2 normal.  Abdominal:     Palpations: Abdomen is soft.     Tenderness: There is abdominal tenderness in the epigastric area.  Musculoskeletal:     Right lower leg: No swelling.     Left lower leg: No swelling.  Skin:    General: Skin is warm.     Findings: No rash.  Neurological:     Mental Status: He is alert and oriented to person, place, and time.     Data Reviewed: Recent  CT scan reviewed, abdominal ultrasound right lower quadrant reviewed, recent endoscopy reviewed  Potassium 3.1, creatinine 0.70, alkaline phosphatase 233, total bilirubin 1.9, AST 63, hemoglobin 8 point tolerated, platelet count 158, recent ferritin 46   Disposition: Status is: Inpatient Remains inpatient appropriate because: Trying to figure out the reason for his chest pain and epigastric pain.  For vascular surgery procedure tomorrow for celiac stenosis and superior mesenteric artery stenosis.  Planned Discharge Destination: Home    Time spent: 28 minutes  Author: Loletha Grayer, MD 10/23/2022 3:13 PM  For on call review [email or web address].

## 2022-10-23 NOTE — Assessment & Plan Note (Addendum)
Seems to improved after placing a stent in the SMA.  Also reviewed endoscopy from 05/30/2022 showing esophagitis and portal hypertensive gastropathy and duodenitis.  We will continue empiric PPI.

## 2022-10-23 NOTE — Assessment & Plan Note (Addendum)
Last hemoglobin 8.6.  Continue to monitor hemoglobin.  We will give IV iron today.

## 2022-10-23 NOTE — Assessment & Plan Note (Signed)
Monitoring on telemetry

## 2022-10-23 NOTE — Assessment & Plan Note (Deleted)
Replaced potassium and also magnesium.

## 2022-10-23 NOTE — Consult Note (Signed)
[facility] Consult Note  MRN : [id number]  Ethan Wells is a 51 y.o. (1971/08/10) male who presents with chief complaint of  Chief Complaint  Patient presents with   Chest Pain  .   Consulting Physician:Richard Wieting, MD Reason for consult: Epigastric pain with mesenteric stenosis History of Present Illness: Ethan Wells is a 51 year old male with a previous medical history significant of of polysubstance abuse, coronary artery disease, anemia, chronic pain syndrome who presents to [facility] following generalized body aches and pains.  The patient also previously had complaints of epigastric pain and chest pain as well.  At presentation to [facility] he was found to have a potassium of 2.2 which is subsequently risen to 3.1.  The patient is alert and oriented however he is a somewhat poor historian.  He does endorse having epigastric pain and symptoms and he endorses having difficulty with eating.  He denies nausea and vomiting.  He describes somewhat of a food phobia.  He notes that the pain is not always consistent with food however.  He does not note any recent weight loss.  The patient had a CT angio of his abdomen and pelvis that noted that he had significant stenosis of the SMA and celiac arteries.  Current Facility-Administered Medications  Medication Dose Route Frequency Provider Last Rate Last Admin   acetaminophen (TYLENOL) tablet 650 mg  650 mg Oral Q6H PRN Dibia, Manfred Shirts, MD       Or   acetaminophen (TYLENOL) suppository 650 mg  650 mg Rectal Q6H PRN Dibia, Manfred Shirts, MD       albuterol (PROVENTIL) (2.5 MG/3ML) 0.083% nebulizer solution 2.5 mg  2.5 mg Nebulization Q4H PRN Dibia, Manfred Shirts, MD       aspirin chewable tablet 81 mg  81 mg Oral Daily Dibia, Manfred Shirts, MD   81 mg at 10/23/22 1001   calcium-vitamin D (OSCAL WITH D) 500-5 MG-MCG per tablet 1 tablet  1 tablet Oral BID Dibia, Manfred Shirts,  MD   1 tablet at 10/23/22 1001   diltiazem (CARDIZEM) tablet 30 mg  30 mg Oral Q12H Dibia, Manfred Shirts, MD   30 mg at 83/41/96 2229   folic acid (FOLVITE) tablet 1 mg  1 mg Oral Daily Dibia, Manfred Shirts, MD   1 mg at 10/23/22 1001   gabapentin (NEURONTIN) capsule 300 mg  300 mg Oral TID Dibia, Manfred Shirts, MD   300 mg at 10/23/22 1001   heparin injection 5,000 Units  5,000 Units Subcutaneous Q8H Dibia, Manfred Shirts, MD   5,000 Units at 10/23/22 0554   HYDROcodone-acetaminophen (NORCO/VICODIN) 5-325 MG per tablet 1-2 tablet  1-2 tablet Oral Q4H PRN Dibia, Manfred Shirts, MD   2 tablet at 10/23/22 1001   LORazepam (ATIVAN) tablet 1-4 mg  1-4 mg Oral Q1H PRN Wyvonnia Dusky, MD   1 mg at 10/23/22 1236   magnesium oxide (MAG-OX) tablet 400 mg  400 mg Oral BID Loletha Grayer, MD   400 mg at 10/23/22 1001   methocarbamol (ROBAXIN) tablet 750 mg  750 mg Oral Daily PRN Dibia, Manfred Shirts, MD   750 mg at 10/22/22 0428   methylPREDNISolone sodium succinate (SOLU-MEDROL) 40 mg/mL injection 40 mg  40 mg Intravenous Daily Loletha Grayer, MD   40 mg at 10/23/22 1236   multivitamin with minerals tablet 1 tablet  1 tablet Oral Daily Dibia, Manfred Shirts, MD   1 tablet at 10/23/22 1001  nicotine (NICODERM CQ - dosed in mg/24 hours) patch 14 mg  14 mg Transdermal Daily Dibia, Manfred Shirts, MD   14 mg at 10/23/22 1000   pantoprazole (PROTONIX) EC tablet 40 mg  40 mg Oral Daily Dibia, Manfred Shirts, MD   40 mg at 10/23/22 1001   polyethylene glycol (MIRALAX / GLYCOLAX) packet 17 g  17 g Oral Daily PRN Dibia, Manfred Shirts, MD       potassium chloride SA (KLOR-CON M) CR tablet 40 mEq  40 mEq Oral BID Loletha Grayer, MD   40 mEq at 10/23/22 1001   rosuvastatin (CRESTOR) tablet 40 mg  40 mg Oral Daily Dibia, Manfred Shirts, MD   40 mg at 10/23/22 1001   thiamine (VITAMIN B1) tablet 100 mg  100 mg Oral Daily Dibia, Manfred Shirts, MD   100 mg at 10/23/22 1001    Past Medical History:  Diagnosis Date   CAD (coronary artery disease)    a. 01/2011  Anterior STEMI/Cath/PCI: LM nl, LAD 100d (3.5x56m Vision BMS placed), LCX 248mRI 50, RCA min irregs, EF 40% w/ apical, inferoapical HK.   Cardiac arrest - ventricular fibrillation    a. In setting of STEMI 01/2011   Chronic pain    Cocaine abuse ([place]   Depression with anxiety    ETOH abuse    a. 6-12 beers/day   GERD (gastroesophageal reflux disease)    Hemorrhoids    Hyperlipidemia    Hypertension    Ischemic cardiomyopathy    a. 06/2011 Echo: EF 45-50%, No rwma   Marijuana abuse    Migraines    Tobacco abuse    a. 1/2 ppd x 26 yrs    Past Surgical History:  Procedure Laterality Date   COLONOSCOPY  08/09/2021   Procedure: COLONOSCOPY;  Surgeon: WoLucilla LameMD;  [facility];  Service: Endoscopy;;   CORONARY ANGIOPLASTY WITH STENT PLACEMENT     ESOPHAGOGASTRODUODENOSCOPY N/A 05/30/2022   Procedure: ESOPHAGOGASTRODUODENOSCOPY (EGD);  Surgeon: VaLin LandsmanMD;  [facility];  Service: Gastroenterology;  Laterality: N/A;   ESOPHAGOGASTRODUODENOSCOPY (EGD) WITH PROPOFOL N/A 03/01/2021   Procedure: ESOPHAGOGASTRODUODENOSCOPY (EGD) WITH PROPOFOL;  Surgeon: VaLin LandsmanMD;  [facility]: Endoscopy;  Laterality: N/A;   ESOPHAGOGASTRODUODENOSCOPY (EGD) WITH PROPOFOL N/A 08/09/2021   Procedure: ESOPHAGOGASTRODUODENOSCOPY (EGD) WITH PROPOFOL;  Surgeon: WoLucilla LameMD;  [facility];  Service: Endoscopy;  Laterality: N/A;   FLEXIBLE SIGMOIDOSCOPY N/A 10/02/2022   Procedure: FLEXIBLE SIGMOIDOSCOPY;  Surgeon: StLadene ArtistMD;  [facility];  Laterality: N/A;    Social History Social History   Tobacco Use   Smoking status: Every Day    Packs/day: 0.50    Years: 41.00    Total pack years: 20.50    Types: Cigarettes   Smokeless tobacco: Never   Tobacco comments:    started age [age]Vaping Use   Vaping Use: Never used  Substance Use Topics   Alcohol use: Yes     Alcohol/week: 12.0 standard drinks of alcohol    Types: 12 Cans of beer per week    Comment: at least a 6 pack of beer daily and often a 12 pack   Drug use: Not Currently    Types: Cocaine, Marijuana    Family History Family History  Problem Relation Age of Onset   Coronary artery disease Mother        alive   Other Other  2 sisters alive and well   Alcohol abuse Father        died @ 67   Other Paternal Grandmother        PACER    Allergies  Allergen Reactions   Penicillins Hives   Other Rash    chives     REVIEW OF SYSTEMS (Negative unless checked)  Constitutional: '[]'$ Weight loss  '[]'$ Fever  '[]'$ Chills Cardiac: '[]'$ Chest pain   '[]'$ Chest pressure   '[]'$ Palpitations   '[]'$ Shortness of breath when laying flat   '[]'$ Shortness of breath at rest   '[]'$ Shortness of breath with exertion. Vascular:  '[]'$ Pain in legs with walking   '[]'$ Pain in legs at rest   '[]'$ Pain in legs when laying flat   '[]'$ Claudication   '[]'$ Pain in feet when walking  '[]'$ Pain in feet at rest  '[]'$ Pain in feet when laying flat   '[]'$ History of DVT   '[]'$ Phlebitis   '[]'$ Swelling in legs   '[]'$ Varicose veins   '[]'$ Non-healing ulcers Pulmonary:   '[]'$ Uses home oxygen   '[]'$ Productive cough   '[]'$ Hemoptysis   '[]'$ Wheeze  '[]'$ COPD   '[]'$ Asthma Neurologic:  '[]'$ Dizziness  '[]'$ Blackouts   '[]'$ Seizures   '[]'$ History of stroke   '[]'$ History of TIA  '[]'$ Aphasia   '[]'$ Temporary blindness   '[]'$ Dysphagia   '[]'$ Weakness or numbness in arms   '[]'$ Weakness or numbness in legs Musculoskeletal:  '[]'$ Arthritis   '[]'$ Joint swelling   '[]'$ Joint pain   '[]'$ Low back pain Hematologic:  '[]'$ Easy bruising  '[]'$ Easy bleeding   '[]'$ Hypercoagulable state   '[]'$ Anemic  '[]'$ Hepatitis Gastrointestinal:  '[]'$ Blood in stool   '[]'$ Vomiting blood  '[]'$ Gastroesophageal reflux/heartburn   '[]'$ Difficulty swallowing. Genitourinary:  '[]'$ Chronic kidney disease   '[]'$ Difficult urination  '[]'$ Frequent urination  '[]'$ Burning with urination   '[]'$ Blood in urine Skin:  '[]'$ Rashes   '[]'$ Ulcers   '[]'$ Wounds Psychological:  '[x]'$ History of anxiety   '[]'$  History of  major depression.  Physical Examination  Vitals:   10/22/22 1606 10/22/22 2035 10/23/22 0602 10/23/22 0742  BP: (!) 111/50 128/62 (!) 101/55 (!) 94/56  Pulse: 78 84 82 79  Resp: '18 18 20 20  '$ Temp: 97.9 F (36.6 C) 97.6 F (36.4 C) 97.7 F (36.5 C) (!) 97.5 F (36.4 C)  TempSrc: Oral Oral Oral Oral  SpO2: 98% 98% 98% 100%  Weight:      Height:       Body mass index is 31 kg/m. Gen:  WD/WN, NAD Head: [place]/AT, No temporalis wasting. Prominent temp pulse not noted. Ear/Nose/Throat: Hearing grossly intact, nares w/o erythema or drainage, oropharynx w/o Erythema/Exudate Eyes: Sclera non-icteric, conjunctiva clear Neck: Trachea midline.  No JVD.  Pulmonary:  Good air movement, respirations not labored, equal bilaterally.  Cardiac: RRR, normal S1, S2. Vascular:  Vessel Right Left  PT Palpable Palpable  DP Palpable Palpable   Gastrointestinal: soft, non-tender/non-distended. No guarding/reflex.  Musculoskeletal: M/S 5/5 throughout.  Extremities without ischemic changes.  No deformity or atrophy. No edema. Neurologic: Sensation grossly intact in extremities.  Symmetrical.  Speech is fluent. Motor exam as listed above. Psychiatric: Judgment intact, Mood & affect appropriate for pt's clinical situation. Dermatologic: No rashes or ulcers noted.  No cellulitis or open wounds. Lymph : No Cervical, Axillary, or Inguinal lymphadenopathy.    CBC Lab Results  Component Value Date   WBC 5.3 10/23/2022   HGB 8.9 (L) 10/23/2022   HCT 27.7 (L) 10/23/2022   MCV 93.6 10/23/2022   PLT 158 10/23/2022    BMET    Component Value Date/Time   NA 139 10/23/2022 0449  NA 134 (L) 09/06/2013 1458   K 3.1 (L) 10/23/2022 0449   K 3.5 09/06/2013 1458   CL 105 10/23/2022 0449   CL 102 09/06/2013 1458   CO2 28 10/23/2022 0449   CO2 23 09/06/2013 1458   GLUCOSE 118 (H) 10/23/2022 0449   GLUCOSE 167 (H) 09/06/2013 1458   BUN <5 (L) 10/23/2022 0449   BUN 4 (L) 09/06/2013 1458   CREATININE 0.70  10/23/2022 0449   CREATININE 0.49 (L) 09/23/2022 1034   CALCIUM 8.6 (L) 10/23/2022 0449   CALCIUM 8.9 09/06/2013 1458   GFRNONAA >60 10/23/2022 0449   GFRNONAA >60 09/06/2013 1458   GFRAA >60 06/05/2020 1109   GFRAA >60 09/06/2013 1458   Estimated Creatinine Clearance: 110.5 mL/min (by C-G formula based on SCr of 0.7 mg/dL).  COAG Lab Results  Component Value Date   INR 1.2 09/30/2022   INR 1.1 08/07/2021   INR 0.9 12/19/2020    Radiology DG Chest Port 1 View  Result Date: 10/21/2022 CLINICAL DATA:  Chest pain EXAM: PORTABLE CHEST 1 VIEW COMPARISON:  09/30/2022 FINDINGS: Lungs are clear.  No pleural effusion or pneumothorax. The heart is top-normal in size. IMPRESSION: No evidence of acute cardiopulmonary disease. Electronically Signed   By: Julian Hy M.D.   On: 10/21/2022 20:38   NM Myocar Multi W/Spect Tamela Oddi Motion / EF  Result Date: 10/03/2022   The study is low risk.   Findings are consistent with prior myocardial infarction. No ischemia.   No ST deviation was noted.   LV perfusion is abnormal. There is no evidence of ischemia. There is no evidence of infarction. Defect 1: There is a small defect with severe reduction in uptake present in the basal inferior location(s) that is fixed. There is abnormal wall motion in the defect area. Consistent with infarction.   Left ventricular function is abnormal. There was a single regional abnormality. Nuclear stress EF: 53 %. The left ventricular ejection fraction is mildly decreased (45-54%). End diastolic cavity size is normal. End systolic cavity size is normal.   ECHOCARDIOGRAM COMPLETE  Result Date: 10/01/2022    ECHOCARDIOGRAM REPORT   Patient Name:   Ethan Wells Date of Exam: 10/01/2022 Medical Rec #:  [id number]           Height:       65.0 in Accession #:    [id number]          Weight:       187.0 lb Date of Birth:  16-Jan-1971           BSA:          1.922 m Patient Age:    61 years            BP:           124/64  mmHg Patient Gender: M                   HR:           98 bpm. Exam Location:  Inpatient Procedure: 2D Echo, Cardiac Doppler, Color Doppler and Intracardiac            Opacification Agent Indications:    R07.9* Chest pain, unspecified. Elevated troponin  History:        Patient has prior history of Echocardiogram examinations, most                 recent 12/20/2020. Previous Myocardial Infarction; Risk  Factors:Current Smoker and Dyslipidemia. Cocaine use. ETOH.  Sonographer:    Roseanna Rainbow RDCS Referring Phys: [place]  Sonographer Comments: Technically difficult study due to poor echo windows. IMPRESSIONS  1. Left ventricular ejection fraction, by estimation, is 60 to 65%. Left ventricular ejection fraction by 2D MOD biplane is 64.0 %. The left ventricle has normal function. The left ventricle has no regional wall motion abnormalities. The left ventricular internal cavity size was mildly dilated. Left ventricular diastolic parameters are consistent with Grade I diastolic dysfunction (impaired relaxation).  2. Right ventricular systolic function is normal. The right ventricular size is normal.  3. Left atrial size was moderately dilated.  4. The mitral valve is grossly normal. Trivial mitral valve regurgitation.  5. The aortic valve is tricuspid. Aortic valve regurgitation is not visualized. Comparison(s): Changes from prior study are noted. 12/20/2020: LVEF 90-30%, normal diastolic function. FINDINGS  Left Ventricle: Left ventricular ejection fraction, by estimation, is 60 to 65%. Left ventricular ejection fraction by 2D MOD biplane is 64.0 %. The left ventricle has normal function. The left ventricle has no regional wall motion abnormalities. Definity contrast agent was given IV to delineate the left ventricular endocardial borders. The left ventricular internal cavity size was mildly dilated. There is no left ventricular hypertrophy. Left ventricular diastolic parameters are consistent  with Grade I diastolic dysfunction (impaired relaxation). Indeterminate filling pressures. Right Ventricle: The right ventricular size is normal. No increase in right ventricular wall thickness. Right ventricular systolic function is normal. Left Atrium: Left atrial size was moderately dilated. Right Atrium: Right atrial size was normal in size. Pericardium: There is no evidence of pericardial effusion. Mitral Valve: The mitral valve is grossly normal. Trivial mitral valve regurgitation. Tricuspid Valve: The tricuspid valve is grossly normal. Tricuspid valve regurgitation is trivial. Aortic Valve: The aortic valve is tricuspid. Aortic valve regurgitation is not visualized. Pulmonic Valve: The pulmonic valve was normal in structure. Pulmonic valve regurgitation is not visualized. Aorta: The aortic root and ascending aorta are structurally normal, with no evidence of dilitation. IAS/Shunts: No atrial level shunt detected by color flow Doppler.  LEFT VENTRICLE PLAX 2D                        Biplane EF (MOD) LVIDd:         5.55 cm         LV Biplane EF:   Left LVIDs:         4.05 cm                          ventricular LV PW:         1.00 cm                          ejection LV IVS:        1.00 cm                          fraction by LVOT diam:     2.70 cm                          2D MOD LV SV:         121  biplane is LV SV Index:   63                               64.0 %. LVOT Area:     5.73 cm                                Diastology                                LV e' medial:    4.35 cm/s LV Volumes (MOD)               LV E/e' medial:  23.2 LV vol d, MOD    126.0 ml      LV e' lateral:   9.90 cm/s A2C:                           LV E/e' lateral: 10.2 LV vol d, MOD    141.0 ml A4C: LV vol s, MOD    42.1 ml A2C: LV vol s, MOD    56.1 ml A4C: LV SV MOD A2C:   83.9 ml LV SV MOD A4C:   141.0 ml LV SV MOD BP:    86.5 ml RIGHT VENTRICLE             IVC RV S prime:     11.40 cm/s  IVC diam: 1.90  cm TAPSE (M-mode): 1.9 cm LEFT ATRIUM             Index        RIGHT ATRIUM           Index LA diam:        3.80 cm 1.98 cm/m   RA Area:     16.30 cm LA Vol (A2C):   33.4 ml 17.37 ml/m  RA Volume:   36.20 ml  18.83 ml/m LA Vol (A4C):   76.6 ml 39.85 ml/m LA Biplane Vol: 50.6 ml 26.32 ml/m  AORTIC VALVE LVOT Vmax:   121.00 cm/s LVOT Vmean:  82.300 cm/s LVOT VTI:    0.211 m  AORTA Ao Root diam: 3.50 cm Ao Asc diam:  3.30 cm MITRAL VALVE MV Area (PHT): 5.32 cm     SHUNTS MV Decel Time: 143 msec     Systemic VTI:  0.21 m MV E velocity: 101.00 cm/s  Systemic Diam: 2.70 cm MV A velocity: 118.00 cm/s MV E/A ratio:  0.86 Lyman Bishop MD Electronically signed by Lyman Bishop MD Signature Date/Time: 10/01/2022/1:03:26 PM    Final    CT Angio Chest/Abd/Pel for Dissection W and/or Wo Contrast  Result Date: 09/30/2022 CLINICAL DATA:  Acute aortic syndrome suspected. Headache and rectal bleeding. Chest pain. EXAM: CT ANGIOGRAPHY CHEST, ABDOMEN AND PELVIS TECHNIQUE: Non-contrast CT of the chest was initially obtained. Multidetector CT imaging through the chest, abdomen and pelvis was performed using the standard protocol during bolus administration of intravenous contrast. Multiplanar reconstructed images and MIPs were obtained and reviewed to evaluate the vascular anatomy. RADIATION DOSE REDUCTION: This exam was performed according to the departmental dose-optimization program which includes automated exposure control, adjustment of the mA and/or kV according to patient size and/or use of iterative reconstruction technique. CONTRAST:  40m OMNIPAQUE IOHEXOL 350 MG/ML SOLN COMPARISON:  CT angio chest, CT  abdomen and pelvis 08/07/2021. FINDINGS: CTA CHEST FINDINGS Cardiovascular: Preferential opacification of the thoracic aorta. No evidence of thoracic aortic aneurysm or dissection. Normal heart size. No pericardial effusion. Coronary artery calcifications are present. There is enlargement of the main pulmonary artery  which can be seen with pulmonary artery hypertension. There is no central pulmonary embolism. Mediastinum/Nodes: No enlarged mediastinal, hilar, or axillary lymph nodes. Thyroid gland, trachea, and esophagus demonstrate no significant findings. Lungs/Pleura: There is minimal linear scarring in the right lung apex. The lungs are otherwise clear. There is no pleural effusion or pneumothorax. There are minimal secretions in the left mainstem bronchus. Musculoskeletal: No chest wall abnormality. No acute or significant osseous findings. Review of the MIP images confirms the above findings. CTA ABDOMEN AND PELVIS FINDINGS VASCULAR Aorta: Normal caliber aorta without aneurysm, dissection, vasculitis or significant stenosis. Mild calcified atherosclerotic disease is seen throughout the aorta. Celiac: Severe focal stenosis at the origin of the celiac artery. Celiac artery and branches are otherwise within normal limits. SMA: Severe focal stenosis origin of the superior mesenteric artery. Superior mesenteric artery otherwise widely patent and within normal limits. Renals: Both renal arteries are patent without evidence of aneurysm, dissection, vasculitis, fibromuscular dysplasia or significant stenosis. IMA: Patent without evidence of aneurysm, dissection, vasculitis or significant stenosis. Inflow: Patent without evidence of aneurysm, dissection, vasculitis or significant stenosis. Veins: No obvious venous abnormality within the limitations of this arterial phase study. Review of the MIP images confirms the above findings. NON-VASCULAR Hepatobiliary: Liver is mildly enlarged. No focal liver lesions are seen. No gallstones are identified. No biliary ductal dilatation. Pancreas: Unremarkable. No pancreatic ductal dilatation or surrounding inflammatory changes. Spleen: Mildly enlarged, unchanged. Adrenals/Urinary Tract: Adrenal glands are unremarkable. Kidneys are normal, without renal calculi, focal lesion, or hydronephrosis.  Bladder is unremarkable. Stomach/Bowel: There is mild diffuse colonic wall thickening to the level of the rectum. There is mild surrounding inflammation involving the rectum and distal sigmoid colon. Appendix is not definitely seen. Small bowel and stomach are within normal limits. Lymphatic: No enlarged lymph nodes are identified. Reproductive: Prostate is unremarkable. Other: Small fat containing left inguinal hernia. There is no ascites. There is mild fat stranding and haziness in the retroperitoneum at the level of the inferior aorta and IVC. This is new from prior. Musculoskeletal: No acute or significant osseous findings. Review of the MIP images confirms the above findings. IMPRESSION: 1. No evidence for aortic dissection or aneurysm. 2. Diffuse colonic wall thickening with mild surrounding inflammation compatible with nonspecific colitis. 3. New mild fat stranding and haziness in the retroperitoneum at the level of the inferior aorta and IVC. Findings may be related to infectious or inflammatory process. 4. Severe focal stenosis at the origin of the celiac artery and superior mesenteric artery. 5. Stable hepatosplenomegaly. Electronically Signed   By: Ronney Asters M.D.   On: 09/30/2022 20:14   CT Head Wo Contrast  Result Date: 09/30/2022 CLINICAL DATA:  Headache EXAM: CT HEAD WITHOUT CONTRAST TECHNIQUE: Contiguous axial images were obtained from the base of the skull through the vertex without intravenous contrast. RADIATION DOSE REDUCTION: This exam was performed according to the departmental dose-optimization program which includes automated exposure control, adjustment of the mA and/or kV according to patient size and/or use of iterative reconstruction technique. COMPARISON:  08/19/2022 FINDINGS: Brain: No evidence of acute infarction, hemorrhage, hydrocephalus, extra-axial collection or mass lesion/mass effect. Mild subcortical white matter and periventricular small vessel ischemic changes.  Vascular: Mild intracranial atherosclerosis. Skull: Normal. Negative for fracture  or focal lesion. Sinuses/Orbits: The visualized paranasal sinuses are essentially clear. The mastoid air cells are unopacified. Other: None. IMPRESSION: No acute intracranial abnormality. Mild small vessel ischemic changes. Electronically Signed   By: Julian Hy M.D.   On: 09/30/2022 20:06   DG Chest 2 View  Result Date: 09/30/2022 CLINICAL DATA:  Chest pain, shortness of breath EXAM: CHEST - 2 VIEW COMPARISON:  Previous studies including the examination of 08/19/2022 FINDINGS: Transverse diameter of heart is increased. Increase in AP diameter of chest suggests COPD. There are no signs of pulmonary edema or focal pulmonary consolidation. There is no pleural effusion or pneumothorax. IMPRESSION: Cardiomegaly. There are no signs of pulmonary edema or focal pulmonary consolidation. Electronically Signed   By: Elmer Picker M.D.   On: 09/30/2022 12:21      Assessment/Plan 1.  Mesenteric stenosis  The patient CT scan indicated evidence of significant, nearly occlusive disease of the SMA and celiac arteries.  Given that the patient has had frequent complaints of epigastric pain and discomfort, this is certainly a possible cause of the patient's ongoing pain and discomfort.  Because of this it would be prudent to move forward with a mesenteric angiogram to evaluate as well as possibly treat the stenoses.  If the patient does not have significant stenosis of both arteries, we will plan on treating the SMA with treatment of the celiac on an outpatient basis.  I discussed the risk, benefits and alternatives and the patient was agreeable to proceed.  We will plan on having the patient n.p.o. at midnight and moving forward on Thursday. 2.  Hyperlipidemia Continue statin and aspirin 3.  Tobacco use Cessation is advised  Plan of care discussed with Dr. Lucky Cowboy and he is in agreement with plan noted above.   Family  Communication:  Total Time:75 minutes  I spent 75 minutes in this encounter including personally reviewing extensive medical records, personally reviewing imaging studies and compared to prior scans, counseling the patient, placing orders, coordinating care and performing appropriate documentation  Thank you for allowing [place] to participate in the care of this patient.   Kris Hartmann, NP [facility] [phone] (Office Phone) [phone] (Office Fax) [phone] (Pager)  10/23/2022 1:58 PM  Staff may message me via secure chat in [place]  but this may not receive immediate response,  please page for urgent matters!  Dictation software was used to generate the above note. Typos may occur and escape review, as with typed/written notes. Any error is purely unintentional.  Please contact me directly for clarity if needed.

## 2022-10-23 NOTE — Assessment & Plan Note (Addendum)
On Crestor, restarted aspirin.

## 2022-10-23 NOTE — Hospital Course (Addendum)
Ethan Wells is a 51 y.o. male with medical history significant of polysubstance abuse, coronary artery disease, anemia due to hemmoroids, QT prolongation chronic pain syndrome, who presents to the ER today on account of generalized body aches and pains.  At the ER he was found to have potassium of 2.2 subsequently started on IV potassium.  Patient is a poor historian. During my encounter patient was complaining of pain in his arm due to the IV potassium. He endorses recent use of cocaine and alcohol.   Labs concerning for hyponatremia sodium 133, potassium 2.2, magnesium 1.7, hemoglobin 8.8.   Chest x-ray shows no pneumonia, pneumothorax or pleural effusion.   EKG shows no acute ST-T wave changes, first 2 sets of high-sensitivity troponins were 13, 13.  Patient was started on a trial of Solu-Medrol to see if this helps out his pains.  ANA negative. Rheumatoid factor slightly elevated.  Dr. Lucky Cowboy did a angiogram and placed a stent in the superior mesenteric artery.  Patient's epigastric pain improved after this.  Patient on aspirin.  Patient complaining of rectal bleeding.  Reviewed recent colonoscopy and flexible sigmoidoscopy and the patient does have some bleeding hemorrhoids.  Anusol ordered.  Dr. Hampton Abbot did a hemorrhoidal procedure on 10/28/2022.  On 10/29/2022, the patient had some chest pain and then developed shortness of breath.  EKGs were done which showed nonspecific changes.  Troponins pending.  Chest x-ray looks like pulmonary vascular congestion and 60 mg of Lasix was given.  The patient was started on BiPAP and transferred to the stepdown unit.  We will give another dose of IV Lasix this evening.  Reviewed recent echocardiogram with an EF of 60%.  Case discussed with Dr. Saunders Revel cardiology.  Aspirin restarted.  11/15: Hemodynamically stable on 2 L this morning.  Troponin peaked at 1607, CTA was done for concern of PE and it was negative for PE, did have some finding consistent with  pulmonary hypertension and bilateral groundglass opacities with mild pleural effusion. He was placed on heparin infusion, most likely will need cardiac cath based on his cardiac history but cardiology would like to give him a DAPT challenge before due to recent GI bleed. Hemoglobin currently stable around 8.4.  BNP elevated at 1049. Patient continued to have mild intermittent hematochezia, per surgery it is common after hemorrhoidectomy.  11/16: Hemodynamically stable, patient had bright red blood per rectum was using commode for BM earlier this morning, proximately 200 mL of blood seen by nursing staff, also had 10 out of 10 rectal pain.  Surgical site probably patient was made n.p.o. as they will take him back to the OR for examination under anesthesia.  Hemoglobin dropped to from 8.4 yesterday. He was found to have 2 bleeding pedicles which were ligated by general surgery in the OR.  11/17: Patient with no more active bleeding, hemoglobin dropped to 7.7.  Continued to have some rectal pain.  Ordered 1 unit of PRBC due to recent ACS. Cardiology to do cardiac cath on Monday, If hemoglobin remained stable.  11/18: Hemodynamically stable.  Had another small bleeding per rectum last night so heparin infusion was discontinued.  Received 1 unit of PRBC with hemoglobin of 7.3, hemoglobin this morning 9.6, with slight increase in leukocytosis to 12.5. Surgery scheduled his Tylenol and added Celebrex to see if that will help controlling his pain.  Patient should avoid straining during defecation.  11/19: Patient continued to have mild lower GI bleeding with bowel movement.  Surgery does not have  any more to offer at this time, stating that this much bleeding was anticipated however while after hemorrhoidectomy.  Hemoglobin slowly trending down, at 8.8 today.  Most likely will get his cardiac cath tomorrow.  Keep holding heparin.  11/20: Vitals stable.  Labs seems stable with resolution of leukocytosis,  hemoglobin stable around 8.9.  Cardiac catheterization today with patent prior PCI but found to have new stenosis at mid left circumflex and mid right coronary artery with collaterals.  No intervention was done.  They can attempt intervention and other times once his GI bleeding issue has been completely resolved as he cannot take DAPT at this time.  They are recommending medical management.  11/21: Patient remained stable, hemoglobin stable at 8.7.  Intermittently having small bleedings with defecation.  Cardiology would like to continue with IV diuresis for 1-2 more days as he was having elevated pressures on cath.

## 2022-10-24 ENCOUNTER — Encounter: Payer: Self-pay | Admitting: Vascular Surgery

## 2022-10-24 ENCOUNTER — Encounter
Admission: EM | Disposition: A | Discharge status: Home / Self Care | Payer: Self-pay | Source: Home / Self Care | Attending: Internal Medicine

## 2022-10-24 ENCOUNTER — Other Ambulatory Visit: Payer: Medicare HMO

## 2022-10-24 DIAGNOSIS — R1013 Epigastric pain: Secondary | ICD-10-CM | POA: Diagnosis not present

## 2022-10-24 DIAGNOSIS — I771 Stricture of artery: Secondary | ICD-10-CM | POA: Diagnosis not present

## 2022-10-24 DIAGNOSIS — E876 Hypokalemia: Secondary | ICD-10-CM | POA: Diagnosis not present

## 2022-10-24 DIAGNOSIS — R079 Chest pain, unspecified: Secondary | ICD-10-CM | POA: Diagnosis not present

## 2022-10-24 DIAGNOSIS — K551 Chronic vascular disorders of intestine: Secondary | ICD-10-CM | POA: Diagnosis not present

## 2022-10-24 HISTORY — PX: VISCERAL ANGIOGRAPHY: CATH118276

## 2022-10-24 LAB — HEMOGLOBIN: Hemoglobin: 8.6 g/dL — ABNORMAL LOW (ref 13.0–17.0)

## 2022-10-24 SURGERY — VISCERAL ANGIOGRAPHY
Anesthesia: Moderate Sedation

## 2022-10-24 MED ORDER — MIDAZOLAM HCL 2 MG/ML PO SYRP: 8.0000 mg | ORAL_SOLUTION | Freq: Once | ORAL | Status: DC | PRN

## 2022-10-24 MED ORDER — FENTANYL CITRATE (PF) 100 MCG/2ML IJ SOLN
INTRAMUSCULAR | Status: AC
  Filled 2022-10-24: qty 2

## 2022-10-24 MED ORDER — FAMOTIDINE 20 MG PO TABS: 40.0000 mg | ORAL_TABLET | Freq: Once | ORAL | Status: DC | PRN

## 2022-10-24 MED ORDER — DIPHENHYDRAMINE HCL 50 MG/ML IJ SOLN: 50.0000 mg | Freq: Once | INTRAMUSCULAR | Status: DC | PRN

## 2022-10-24 MED ORDER — MIDAZOLAM HCL 2 MG/2ML IJ SOLN
INTRAMUSCULAR | Status: DC | PRN
  Administered 2022-10-24 (×2): 1 mg via INTRAVENOUS
  Administered 2022-10-24: 2 mg via INTRAVENOUS

## 2022-10-24 MED ORDER — FENTANYL CITRATE PF 50 MCG/ML IJ SOSY
PREFILLED_SYRINGE | INTRAMUSCULAR | Status: AC
  Filled 2022-10-24: qty 1

## 2022-10-24 MED ORDER — VANCOMYCIN HCL IN DEXTROSE 1-5 GM/200ML-% IV SOLN
1000.0000 mg | INTRAVENOUS | Status: AC
  Filled 2022-10-24: qty 200

## 2022-10-24 MED ORDER — METHYLPREDNISOLONE SODIUM SUCC 125 MG IJ SOLR: 125.0000 mg | Freq: Once | INTRAMUSCULAR | Status: DC | PRN

## 2022-10-24 MED ORDER — FENTANYL CITRATE (PF) 100 MCG/2ML IJ SOLN
INTRAMUSCULAR | Status: DC | PRN
  Administered 2022-10-24: 25 ug via INTRAVENOUS
  Administered 2022-10-24: 50 ug via INTRAVENOUS
  Administered 2022-10-24 (×2): 25 ug via INTRAVENOUS

## 2022-10-24 MED ORDER — SODIUM CHLORIDE 0.9 % IV SOLN: INTRAVENOUS | Status: DC

## 2022-10-24 MED ORDER — VANCOMYCIN HCL IN DEXTROSE 1-5 GM/200ML-% IV SOLN
INTRAVENOUS | Status: AC
  Administered 2022-10-24: 1000 mg via INTRAVENOUS
  Filled 2022-10-24: qty 200

## 2022-10-24 MED ORDER — MIDAZOLAM HCL 5 MG/5ML IJ SOLN
INTRAMUSCULAR | Status: AC
  Filled 2022-10-24: qty 5

## 2022-10-24 MED ORDER — HEPARIN SODIUM (PORCINE) 1000 UNIT/ML IJ SOLN
INTRAMUSCULAR | Status: AC
  Filled 2022-10-24: qty 10

## 2022-10-24 MED ORDER — HEPARIN SODIUM (PORCINE) 1000 UNIT/ML IJ SOLN
INTRAMUSCULAR | Status: DC | PRN
  Administered 2022-10-24: 5000 [IU] via INTRAVENOUS

## 2022-10-24 MED ORDER — IODIXANOL 320 MG/ML IV SOLN
INTRAVENOUS | Status: DC | PRN
  Administered 2022-10-24: 45 mL

## 2022-10-24 SURGICAL SUPPLY — 18 items
BALLN LUTONIX AV 8X40X75 (BALLOONS) ×1 IMPLANT
BALLOON LUTONIX AV 8X40X75 (BALLOONS) IMPLANT
CATH ANGIO 5F PIGTAIL 65CM (CATHETERS) IMPLANT
CATH VS1 5X80 (CATHETERS) IMPLANT
COVER PROBE ULTRASOUND 5X96 (MISCELLANEOUS) IMPLANT
DEVICE STARCLOSE SE CLOSURE (Vascular Products) IMPLANT
DEVICE TORQUE (MISCELLANEOUS) IMPLANT
GLIDEWIRE STIFF .35X180X3 HYDR (WIRE) IMPLANT
KIT ENCORE 26 ADVANTAGE (KITS) IMPLANT
PACK ANGIOGRAPHY (CUSTOM PROCEDURE TRAY) ×1 IMPLANT
SHEATH ANL2 6FRX45 HC (SHEATH) IMPLANT
SHEATH BRITE TIP 5FRX11 (SHEATH) IMPLANT
STENT LIFESTREAM 7X26X80 (Permanent Stent) IMPLANT
SYR MEDRAD MARK 7 150ML (SYRINGE) IMPLANT
TUBING CONTRAST HIGH PRESS 72 (TUBING) IMPLANT
WIRE GUIDERIGHT .035X150 (WIRE) IMPLANT
WIRE MAGIC TOR.035 180C (WIRE) IMPLANT
WIRE SUPRACORE 190CM (MISCELLANEOUS) IMPLANT

## 2022-10-24 NOTE — Care Management Important Message (Signed)
Important Message  Patient Details  Name: Ethan Wells MRN: [id number] Date of Birth: August 06, 1971   Medicare Important Message Given:  Yes     [name] 10/24/2022, 2:00 PM

## 2022-10-24 NOTE — Progress Notes (Signed)
Progress Note   [name] [id number]:[id number] DOB: [birth year] DOA: 10/21/2022     3 DOS: the patient was seen and examined on 10/24/2022   Brief hospital course: Ethan Wells is a 51 y.o. male with medical history significant of polysubstance abuse, coronary artery disease, anemia due to hemmoroids, QT prolongation chronic pain syndrome, who presents to the ER today on account of generalized body aches and pains.  At the ER he was found to have potassium of 2.2 subsequently started on IV potassium.  Patient is a poor historian. During my encounter patient was complaining of pain in his arm due to the IV potassium. He endorses recent use of cocaine and alcohol.   Labs concerning for hyponatremia sodium 133, potassium 2.2, magnesium 1.7, hemoglobin 8.8.   Chest x-ray shows no pneumonia, pneumothorax or pleural effusion.   EKG shows no acute ST-T wave changes, first 2 sets of high-sensitivity troponins were 13, 13  Assessment and Plan: * Chest pain Cardiac enzymes negative.  Patient had a recent stress test that was a low risk scan.  Test was consistent with prior myocardial infarction.  No ischemia seen.  Solu-Medrol seem to help a little bit of his pains.  Check an ANA and rheumatoid factor.  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  For angiogram today.  Also reviewed endoscopy from 05/30/2022 showing esophagitis and portal hypertensive gastropathy and duodenitis.  We will continue empiric PPI.  CAD (coronary artery disease) On aspirin and Crestor  Hypokalemia Replaced potassium and also magnesium.  Iron deficiency anemia Last hemoglobin 8.6.  Continue to monitor hemoglobin.  Prolonged QT interval Monitoring on telemetry  Alcohol dependence ([place]) No signs of withdrawal.  Continue folate thiamine.        Subjective: Patient still having a lot of chest pain and a  little epigastric pain.  Joint pains little bit better after starting Solu-Medrol.  For angiogram today for mesenteric stenosis on recent CT scan.  Physical Exam: Vitals:   10/24/22 0334 10/24/22 0740 10/24/22 1359 10/24/22 1427  BP: (!) 105/58 109/65 110/68   Pulse: (!) 109 98 98   Resp: '20 16 16   '$ Temp: 97.8 F (36.6 C) 98.3 F (36.8 C) 97.8 F (36.6 C)   TempSrc: Oral Oral Oral   SpO2: 98% 98% 97% 100%  Weight:      Height:       Physical Exam HENT:     Head: Normocephalic.     Mouth/Throat:     Pharynx: No oropharyngeal exudate.  Eyes:     General: Lids are normal.     Conjunctiva/sclera: Conjunctivae normal.  Cardiovascular:     Rate and Rhythm: Normal rate and regular rhythm.     Heart sounds: Normal heart sounds, S1 normal and S2 normal.  Abdominal:     Palpations: Abdomen is soft.     Tenderness: There is abdominal tenderness in the epigastric area.  Musculoskeletal:     Right lower leg: No swelling.     Left lower leg: No swelling.  Skin:    General: Skin is warm.     Findings: No rash.  Neurological:     Mental Status: He is alert and oriented to person, place, and time.     Data Reviewed: Last hemoglobin 8.6,   Disposition: Status is: Inpatient Remains inpatient appropriate because: For mesenteric angiogram today  Planned Discharge Destination: Home  Time spent: 28 minutes  Author: Loletha Grayer, MD 10/24/2022 2:32 PM  For on call review [email or web address].

## 2022-10-24 NOTE — Op Note (Signed)
[facility]  Percutaneous Study/Intervention Procedural Note   Date: 10/24/2022  Surgeon(s): Leotis Pain, MD  Assistants: none  Pre-operative Diagnosis: 1.  Chronic Mesenteric ischemia 2. Celiac and SMA stenosis   Post-operative diagnosis:  Same  Procedure(s) Performed:             1.  Ultrasound guidance for vascular access right femoral artery              2.  Catheter placement into celiac artery and SMA from right femoral approach             3.  Aortogram and selective angiogram of the celiac and superior mesenteric artery             4.  Stent placement to the SMA with 7 mm diameter by 28 mm length lifestream stent postdilated to 8 mm             5.  StarClose closure device right femoral artery  Contrast: 45 cc  Fluoro time: 7.8 minutes  EBL: 10 cc  Anesthesia: Approximately 43 minutes of Moderate conscious sedation using 4 mg of Versed and 25 mcg of Fentanyl              Indications:  Patient is a 51 y.o. male who has symptoms consistent with mesenteric ischemia. The patient has a CT scan suggesting significant disease in the SMA and celiac artery although that difficult to discern on the CT scan. The patient is brought in for angiography for further evaluation and potential treatment. Risks and benefits are discussed and informed consent is obtained  Procedure:  The patient was identified and appropriate procedural time out was performed.  The patient was then placed supine on the table and prepped and draped in the usual sterile fashion. Moderate conscious sedation was administered during a face to face encounter with the patient throughout the procedure with my supervision of the RN administering medicines and monitoring the patient's vital signs, pulse oximetry, telemetry and mental status throughout from the start of the procedure until the patient was taken to the recovery room.  Ultrasound was used to evaluate the right common femoral artery.  It  was patent .  A digital ultrasound image was acquired.  A Seldinger needle was used to access the right common femoral artery under direct ultrasound guidance and a permanent image was performed.  A 0.035 J wire was advanced without resistance and a 5Fr sheath was placed.  Pigtail catheter was placed into the aorta and an AP aortogram was performed. This demonstrated normal renal arteries with the aorta and iliac arteries showed no obvious stenosis.  The origins of celiac's territory cannot be seen in this projection. We transitioned to the lateral projection to image the celiac and SMA. The lateral image demonstrated what appeared to be significant stenosis of both vessels so we selectively cannulated both vessels for imaging.  The patient was given 5000 units of IV heparin. We upsized to a 6 Fr sheath.  A V S1 catheter was used to selectively cannulate the celiac artery first.  Selective imaging showed about a 75 to 80% stenosis in the proximal celiac artery.  Used a V S1 catheter to selectively cannulate the superior mesenteric artery and perform selective imaging.  This also demonstrated about a 75 to 80% stenosis of the proximal superior mesenteric artery.  With the superior mesenteric artery being the most important artery, I elected to treat this to try to improve the patient's  clinical course. I crossed the lesion without difficulty with a Magic torque wire and exchanged for a 6 [place] Ansell sheath. I then used a 7 mm diameter x 26 mm length lifestream balloon expandable stent to perform treatment of the SMA. I inflated the balloon to 10 atm.  This was slightly undersized so I upsized to an 8 mm diameter by 4 cm length Lutonix drug-coated angioplasty balloon and inflated to 6 atm and held the inflation for 1 minute. On completion angiogram following this angioplasty, 10% residual stenosis was identified. At this point, I elected to terminate the procedure. The diagnostic catheter was removed. StarClose  closure device was deployed in usual fashion with excellent hemostatic result. The patient was taken to the recovery room in stable condition having tolerated the procedure well.     Findings: 75 to 80% stenosis of the celiac and 75% stenosis of the superior mesenteric artery.  The aorta and iliac arteries were widely patent and there did not appear to be any significant renal artery stenosis.  Disposition: Patient was taken to the recovery room in stable condition having tolerated the procedure well.  Complications:  None  Leotis Pain 10/24/2022 3:27 PM   This note was created with Dragon Medical transcription system. Any errors in dictation are purely unintentional.

## 2022-10-24 NOTE — Interval H&P Note (Signed)
History and Physical Interval Note:  10/24/2022 2:16 PM  Ethan Wells  has presented today for surgery, with the diagnosis of Mesenteric ischemia.  The various methods of treatment have been discussed with the patient and family. After consideration of risks, benefits and other options for treatment, the patient has consented to  Procedure(s): VISCERAL ANGIOGRAPHY (N/A) as a surgical intervention.  The patient's history has been reviewed, patient examined, no change in status, stable for surgery.  I have reviewed the patient's chart and labs.  Questions were answered to the patient's satisfaction.     Leotis Pain

## 2022-10-24 NOTE — Progress Notes (Signed)
Mobility Specialist - Progress Note   10/24/22 1551  Mobility  Activity Off unit  $Mobility charge 1 Mobility   Pt off unit for procedure during attempt. Will attempt at another date/time.   Gretchen Short  Mobility Specialist  10/24/22 3:51 PM

## 2022-10-25 ENCOUNTER — Ambulatory Visit: Payer: Medicare HMO | Admitting: Adult Health

## 2022-10-25 DIAGNOSIS — K551 Chronic vascular disorders of intestine: Secondary | ICD-10-CM | POA: Diagnosis not present

## 2022-10-25 DIAGNOSIS — K648 Other hemorrhoids: Secondary | ICD-10-CM | POA: Diagnosis not present

## 2022-10-25 DIAGNOSIS — E875 Hyperkalemia: Secondary | ICD-10-CM | POA: Diagnosis present

## 2022-10-25 DIAGNOSIS — R1013 Epigastric pain: Secondary | ICD-10-CM | POA: Diagnosis not present

## 2022-10-25 LAB — CBC
HCT: 26.8 % — ABNORMAL LOW (ref 39.0–52.0)
Hemoglobin: 8.6 g/dL — ABNORMAL LOW (ref 13.0–17.0)
MCH: 30.5 pg (ref 26.0–34.0)
MCHC: 32.1 g/dL (ref 30.0–36.0)
MCV: 95 fL (ref 80.0–100.0)
Platelets: 150 10*3/uL (ref 150–400)
RBC: 2.82 MIL/uL — ABNORMAL LOW (ref 4.22–5.81)
RDW: 20.6 % — ABNORMAL HIGH (ref 11.5–15.5)
WBC: 9.5 10*3/uL (ref 4.0–10.5)
nRBC: 0 % (ref 0.0–0.2)

## 2022-10-25 LAB — BASIC METABOLIC PANEL
Anion gap: 3 — ABNORMAL LOW (ref 5–15)
BUN: 9 mg/dL (ref 6–20)
CO2: 23 mmol/L (ref 22–32)
Calcium: 8.5 mg/dL — ABNORMAL LOW (ref 8.9–10.3)
Chloride: 112 mmol/L — ABNORMAL HIGH (ref 98–111)
Creatinine, Ser: 0.68 mg/dL (ref 0.61–1.24)
GFR, Estimated: >60 mL/min (ref >60)
Glucose, Bld: 138 mg/dL — ABNORMAL HIGH (ref 70–99)
Potassium: 5.4 mmol/L — ABNORMAL HIGH (ref 3.5–5.1)
Sodium: 138 mmol/L (ref 135–145)

## 2022-10-25 LAB — ANA W/REFLEX IF POSITIVE: Anti Nuclear Antibody (ANA): NEGATIVE

## 2022-10-25 LAB — MAGNESIUM: Magnesium: 2.2 mg/dL (ref 1.7–2.4)

## 2022-10-25 MED ORDER — HYDROCORTISONE ACETATE 25 MG RE SUPP
25.0000 mg | Freq: Two times a day (BID) | RECTAL | Status: DC
  Administered 2022-10-25 – 2022-10-26 (×3): 25 mg via RECTAL
  Filled 2022-10-25 (×3): qty 1

## 2022-10-25 MED ORDER — SODIUM ZIRCONIUM CYCLOSILICATE 10 G PO PACK
10.0000 g | PACK | Freq: Once | ORAL | Status: AC
  Administered 2022-10-25: 10 g via ORAL
  Filled 2022-10-25: qty 1

## 2022-10-25 MED ORDER — SODIUM CHLORIDE 0.9 % IV SOLN
300.0000 mg | Freq: Once | INTRAVENOUS | Status: AC
  Administered 2022-10-25: 300 mg via INTRAVENOUS
  Filled 2022-10-25: qty 15

## 2022-10-25 NOTE — Assessment & Plan Note (Addendum)
Mesenteric ischemia.  Dr. Lucky Cowboy placed a stent in the SMA on 10/24/2022.  Restarted aspirin.   Arterial ultrasound negative for pseudoaneurysm.

## 2022-10-25 NOTE — Assessment & Plan Note (Addendum)
We will give Anusol suppositories.  Reviewed prior flexible sigmoidoscopy and colonoscopy.

## 2022-10-25 NOTE — Assessment & Plan Note (Signed)
Improved

## 2022-10-25 NOTE — Progress Notes (Signed)
Progress Note   [name] YIF:[id number] DOB: 08-Aug-1971 DOA: 10/21/2022     4 DOS: the patient was seen and examined on 10/25/2022   Brief hospital course: DODD SCHMID is a 51 y.o. male with medical history significant of polysubstance abuse, coronary artery disease, anemia due to hemmoroids, QT prolongation chronic pain syndrome, who presents to the ER today on account of generalized body aches and pains.  At the ER he was found to have potassium of 2.2 subsequently started on IV potassium.  Patient is a poor historian. During my encounter patient was complaining of pain in his arm due to the IV potassium. He endorses recent use of cocaine and alcohol.   Labs concerning for hyponatremia sodium 133, potassium 2.2, magnesium 1.7, hemoglobin 8.8.   Chest x-ray shows no pneumonia, pneumothorax or pleural effusion.   EKG shows no acute ST-T wave changes, first 2 sets of high-sensitivity troponins were 13, 13.  Patient was started on a trial of Solu-Medrol to see if this helps out his pains.  ANA negative.   Dr. Lucky Cowboy did a angiogram and placed a stent in the superior mesenteric artery.  Patient's epigastric pain improved after this.  Patient on aspirin.  Patient complaining of rectal bleeding.  Reviewed recent colonoscopy and flexible sigmoidoscopy and the patient does have some bleeding hemorrhoids.  Anusol suppositories ordered.  Assessment and Plan: * Mesenteric artery stenosis ([facility]) Mesenteric ischemia.  Dr. Lucky Cowboy placed a stent in the SMA on 10/24/2022.  Continue aspirin.  Hemorrhoids, internal, with bleeding We will give Anusol suppositories.  Reviewed prior flexible sigmoidoscopy and colonoscopy.  Hyperkalemia Discontinue potassium supplementation.  1 dose of Lokelma.  Recheck levels tomorrow.  Epigastric pain Seems to improved after placing a stent in the SMA.  Also reviewed endoscopy from 05/30/2022 showing esophagitis and portal hypertensive gastropathy and  duodenitis.  We will continue empiric PPI.  Chest pain Cardiac enzymes negative.  Patient had a recent stress test that was a low risk scan.  Test was consistent with prior myocardial infarction.  No ischemia seen.  Solu-Medrol seem to help a little bit of his pains.  ANA negative.  Rheumatoid factor pending.  CAD (coronary artery disease) On aspirin and Crestor  Iron deficiency anemia Last hemoglobin 8.6.  Continue to monitor hemoglobin.  We will give IV iron today.  Prolonged QT interval Monitoring on telemetry  Alcohol dependence ([place]) No signs of withdrawal.  Continue folate thiamine.        Subjective:   Physical Exam: Vitals:   10/24/22 2041 10/25/22 0441 10/25/22 0809 10/25/22 1523  BP: (!) 118/57 112/64 111/67 116/64  Pulse: 100 83 90 93  Resp: '16 16 19 18  '$ Temp: 97.9 F (36.6 C) 98.2 F (36.8 C) 97.9 F (36.6 C) 97.6 F (36.4 C)  TempSrc: Oral Oral Oral Oral  SpO2: 99% 100% 100% 99%  Weight:      Height:       Physical Exam HENT:     Head: Normocephalic.     Mouth/Throat:     Pharynx: No oropharyngeal exudate.  Eyes:     General: Lids are normal.     Conjunctiva/sclera: Conjunctivae normal.  Cardiovascular:     Rate and Rhythm: Normal rate and regular rhythm.     Heart sounds: Normal heart sounds, S1 normal and S2 normal.  Pulmonary:     Breath sounds: No decreased breath sounds, wheezing, rhonchi or rales.  Abdominal:     Palpations: Abdomen is soft.  Tenderness: There is abdominal tenderness in the epigastric area.  Musculoskeletal:     Right lower leg: No swelling.     Left lower leg: No swelling.  Skin:    General: Skin is warm.     Findings: No rash.  Neurological:     Mental Status: He is alert and oriented to person, place, and time.     Data Reviewed: Potassium 5.4, creatinine 0.68, magnesium 2.2, ANA negative, hemoglobin 8.6  Disposition: Status is: Inpatient Remains inpatient appropriate because: Treating hyperkalemia today.   We will give IV iron with anemia.  Watch rectal bleeding.  Planned Discharge Destination: Home    Time spent: 28 minutes  Author: Loletha Grayer, MD 10/25/2022 3:49 PM  For on call review [email or web address].

## 2022-10-26 ENCOUNTER — Inpatient Hospital Stay: Payer: Medicare HMO

## 2022-10-26 DIAGNOSIS — K551 Chronic vascular disorders of intestine: Secondary | ICD-10-CM | POA: Diagnosis not present

## 2022-10-26 DIAGNOSIS — K648 Other hemorrhoids: Secondary | ICD-10-CM | POA: Diagnosis not present

## 2022-10-26 DIAGNOSIS — E875 Hyperkalemia: Secondary | ICD-10-CM | POA: Diagnosis not present

## 2022-10-26 DIAGNOSIS — R1013 Epigastric pain: Secondary | ICD-10-CM | POA: Diagnosis not present

## 2022-10-26 LAB — BASIC METABOLIC PANEL
Anion gap: 3 — ABNORMAL LOW (ref 5–15)
BUN: 12 mg/dL (ref 6–20)
CO2: 24 mmol/L (ref 22–32)
Calcium: 8.5 mg/dL — ABNORMAL LOW (ref 8.9–10.3)
Chloride: 112 mmol/L — ABNORMAL HIGH (ref 98–111)
Creatinine, Ser: 0.67 mg/dL (ref 0.61–1.24)
GFR, Estimated: >60 mL/min (ref >60)
Glucose, Bld: 142 mg/dL — ABNORMAL HIGH (ref 70–99)
Potassium: 4.4 mmol/L (ref 3.5–5.1)
Sodium: 139 mmol/L (ref 135–145)

## 2022-10-26 LAB — RHEUMATOID FACTOR: Rheumatoid fact SerPl-aCnc: 19.2 IU/mL — ABNORMAL HIGH (ref <14.0)

## 2022-10-26 LAB — HEMOGLOBIN: Hemoglobin: 8.1 g/dL — ABNORMAL LOW (ref 13.0–17.0)

## 2022-10-26 MED ORDER — HYDROCORTISONE (PERIANAL) 2.5 % EX CREA
TOPICAL_CREAM | Freq: Two times a day (BID) | CUTANEOUS | Status: DC
  Administered 2022-10-30: 1 via RECTAL
  Filled 2022-10-26 (×7): qty 28.35

## 2022-10-26 MED ORDER — PREDNISONE 20 MG PO TABS
20.0000 mg | ORAL_TABLET | Freq: Every day | ORAL | Status: DC
  Administered 2022-10-27 – 2022-10-29 (×2): 20 mg via ORAL
  Filled 2022-10-26 (×2): qty 1

## 2022-10-26 MED ORDER — PSYLLIUM 95 % PO PACK
1.0000 | PACK | Freq: Every day | ORAL | Status: DC
  Administered 2022-10-26 – 2022-11-08 (×12): 1 via ORAL
  Filled 2022-10-26 (×14): qty 1

## 2022-10-26 MED ORDER — PANTOPRAZOLE SODIUM 40 MG PO TBEC
40.0000 mg | DELAYED_RELEASE_TABLET | Freq: Two times a day (BID) | ORAL | Status: DC
  Administered 2022-10-26 – 2022-11-08 (×26): 40 mg via ORAL
  Filled 2022-10-26 (×26): qty 1

## 2022-10-26 NOTE — Consult Note (Signed)
Date of Consultation:  10/26/2022  Requesting Physician: Loletha Grayer, MD  Reason for Consultation: Bleeding hemorrhoid  History of Present Illness: Ethan Wells is a 51 y.o. male admitted on 10/21/2022 with chest pain.  He also reported epigastric abdominal pain he had does have a history of celiac and SMA artery stenosis.  He underwent a mesenteric angiogram with Dr. Lucky Cowboy on 10/24/2022 with stent placement in the SMA.  He has been on aspirin and then also started having blood per rectum.  He does have a history of internal hemorrhoids and had a flexible sigmoidoscopy on 10/02/2022 which showed grade 1 internal hemorrhoids which were nonbleeding at the time.  The patient reports that he has had issues with hemorrhoids for a few months now and will have intermittent bleeding.  Due to his abdominal pain issues, he has been more on liquid diet rather than full solids.  When he is on liquids, there is been no significant bleeding but when she switches to more solid food, then he gets more bleeding issues.  Reports that sometimes she has pain with bowel movements and has to push harder.  His history is very tangential and goes around in circles so this difficult to fully understand his history.  He was started on Anusol suppositories.  Past Medical History: Past Medical History:  Diagnosis Date   CAD (coronary artery disease)    a. 01/2011 Anterior STEMI/Cath/PCI: LM nl, LAD 100d (3.5x90m Vision BMS placed), LCX 272mRI 50, RCA min irregs, EF 40% w/ apical, inferoapical HK.   Cardiac arrest - ventricular fibrillation    a. In setting of STEMI 01/2011   Chronic pain    Cocaine [place] ([place]   Depression with anxiety    ETOH abuse    a. 6-12 beers/day   GERD (gastroesophageal reflux disease)    Hemorrhoids    Hyperlipidemia    Hypertension    Ischemic cardiomyopathy    a. 06/2011 Echo: EF 45-50%, No rwma   Marijuana abuse    Migraines    Tobacco abuse    a. 1/2 ppd x 26 yrs     Past  Surgical History: Past Surgical History:  Procedure Laterality Date   COLONOSCOPY  08/09/2021   Procedure: COLONOSCOPY;  Surgeon: WoLucilla LameMD;  [facility];  Service: Endoscopy;;   CORONARY ANGIOPLASTY WITH STENT PLACEMENT     ESOPHAGOGASTRODUODENOSCOPY N/A 05/30/2022   Procedure: ESOPHAGOGASTRODUODENOSCOPY (EGD);  Surgeon: VaLin LandsmanMD;  [facility];  Service: Gastroenterology;  Laterality: N/A;   ESOPHAGOGASTRODUODENOSCOPY (EGD) WITH PROPOFOL N/A 03/01/2021   Procedure: ESOPHAGOGASTRODUODENOSCOPY (EGD) WITH PROPOFOL;  Surgeon: VaLin LandsmanMD;  [place] [facility] [place];  Laterality: N/A;   ESOPHAGOGASTRODUODENOSCOPY (EGD) WITH PROPOFOL N/A 08/09/2021   Procedure: ESOPHAGOGASTRODUODENOSCOPY (EGD) WITH PROPOFOL;  Surgeon: WoLucilla LameMD;  [facility];  Service: Endoscopy;  Laterality: N/A;   FLEXIBLE SIGMOIDOSCOPY N/A 10/02/2022   Procedure: FLEXIBLE SIGMOIDOSCOPY;  Surgeon: StLadene ArtistMD;  [place] [facility]: Gastroenterology;  Laterality: N/A;   VISCERAL ANGIOGRAPHY N/A 10/24/2022   Procedure: VISCERAL ANGIOGRAPHY;  Surgeon: DeAlgernon HuxleyMD;  [place] [facility];  Service: Cardiovascular;  Laterality: N/A;    Home Medications: Prior to Admission medications   Medication Sig Start Date End Date Taking? Authorizing Provider  aspirin 81 MG chewable tablet Chew 1 tablet (81 mg total) by mouth daily. 06/01/22  Yes SrSidney AceMD  calcium-vitamin D (OSCAL WITH D) 500-5  MG-MCG tablet Take 1 tablet by mouth 2 (two) times daily. 10/17/22  Yes Medina-Vargas, Monina C, NP  diltiazem (CARDIZEM) 30 MG tablet Take 1 tablet (30 mg total) by mouth every 12 (twelve) hours. 10/03/22 11/02/22 Yes Donne Hazel, MD  gabapentin (NEURONTIN) 300 MG capsule Take 1 capsule (300 mg total) by mouth 3 (three) times daily. 10/17/22  Yes Medina-Vargas, Monina C, NP  pantoprazole (PROTONIX) 40  MG tablet Take 1 tablet (40 mg total) by mouth daily. 09/23/22  Yes Medina-Vargas, Monina C, NP  rosuvastatin (CRESTOR) 40 MG tablet Take 1 tablet (40 mg total) by mouth daily. 09/23/22  Yes Medina-Vargas, Monina C, NP  methocarbamol (ROBAXIN-750) 750 MG tablet Take 1 tablet (750 mg total) by mouth daily as needed for muscle spasms. 10/17/22   Medina-Vargas, Monina C, NP    Allergies: Allergies  Allergen Reactions   Penicillins Hives   Other Rash    chives    Social History:  reports that he has been smoking cigarettes. He has a 20.50 pack-year smoking history. He has never used smokeless tobacco. He reports current alcohol use of about 12.0 standard drinks of alcohol per week. He reports that he does not currently use drugs after having used the following drugs: Cocaine and Marijuana.   Family History: Family History  Problem Relation Age of Onset   Coronary artery disease Mother        alive   Other Other        2 sisters alive and well   Alcohol abuse Father        died @ 39   Other Paternal Grandmother        PACER    Review of Systems: Review of Systems  Constitutional:  Negative for chills and fever.  Respiratory:  Negative for shortness of breath.   Cardiovascular:  Negative for chest pain.  Gastrointestinal:  Positive for blood in stool. Negative for abdominal pain, nausea and vomiting.    Physical Exam BP 122/66   Pulse 82   Temp 98.1 F (36.7 C)   Resp 16   Ht '5\' 5"'$  (1.651 m)   Wt 84.5 kg   SpO2 100%   BMI 31.00 kg/m  CONSTITUTIONAL: No acute distress HEENT:  Normocephalic, atraumatic, extraocular motion intact. RESPIRATORY:  Normal respiratory effort without pathologic use of accessory muscles. CARDIOVASCULAR: Regular rhythm and rate. ABDOMEN: Patient does have significant ecchymosis in a band-like fashion in the lower abdomen. RECTAL: External exam reveals very mildly enlarged external hemorrhoids in the left lateral and right posterior columns.  Digital  rectal exam, the same 2 internal hemorrhoids are enlarged as well.  No gross blood noted.  Patient does have some discomfort to palpation particular in the posterior area.  However there is no significant inflammation or erythema. MUSCULOSKELETAL:  Normal muscle strength and tone in all four extremities.  No peripheral edema or cyanosis. NEUROLOGIC:  Motor and sensation is grossly normal.  Cranial nerves are grossly intact. PSYCH:  Alert and oriented to person, place and time. Affect is normal.  Laboratory Analysis: Results for orders placed or performed during the hospital encounter of 10/21/22 (from the past 24 hour(s))  Basic metabolic panel     Status: Abnormal   Collection Time: 10/26/22  6:20 AM  Result Value Ref Range   Sodium 139 135 - 145 mmol/L   Potassium 4.4 3.5 - 5.1 mmol/L   Chloride 112 (H) 98 - 111 mmol/L   CO2 24 22 - 32 mmol/L  Glucose, Bld 142 (H) 70 - 99 mg/dL   BUN 12 6 - 20 mg/dL   Creatinine, Ser 0.67 0.61 - 1.24 mg/dL   Calcium 8.5 (L) 8.9 - 10.3 mg/dL   GFR, Estimated >60 >60 mL/min   Anion gap 3 (L) 5 - 15  Hemoglobin     Status: Abnormal   Collection Time: 10/26/22  6:20 AM  Result Value Ref Range   Hemoglobin 8.1 (L) 13.0 - 17.0 g/dL    Imaging: No results found.  Assessment and Plan: This is a 51 y.o. male with history of bleeding internal hemorrhoids.  - Patient currently does not have any active bleeding on the hemorrhoids.  Likely this may be a result of constipation or straining.  Advised that he start a bowel regimen currently has MiraLAX ordered as needed.  He also can do a high-fiber diet and will order Metamucil daily. - Discussed with him about the Anusol suppository and that this may be helping with some of the inflammation.  He prefers to change to an appointment and we will make that change now.  Continue this twice daily.  This will help with the current flareup. - If he continues having bleeding episodes, then discussed with patient that  we may be able to do hemorrhoidectomy here in the hospital during this admission.  Otherwise we can see him as an outpatient in the future to discuss this.  I spent 45 minutes dedicated to the care of this patient on the date of this encounter to include pre-visit review of records, face-to-face time with the patient discussing diagnosis and management, and any post-visit coordination of care.   Melvyn Neth, MD [facility] Pg:  [phone]

## 2022-10-26 NOTE — Progress Notes (Signed)
Progress Note   [name] ENI:[id number] DOB: 1971-03-23 DOA: 10/21/2022     5 DOS: the patient was seen and examined on 10/26/2022   Brief hospital course: Ethan Wells is a 51 y.o. male with medical history significant of polysubstance abuse, coronary artery disease, anemia due to hemmoroids, QT prolongation chronic pain syndrome, who presents to the ER today on account of generalized body aches and pains.  At the ER he was found to have potassium of 2.2 subsequently started on IV potassium.  Patient is a poor historian. During my encounter patient was complaining of pain in his arm due to the IV potassium. He endorses recent use of cocaine and alcohol.   Labs concerning for hyponatremia sodium 133, potassium 2.2, magnesium 1.7, hemoglobin 8.8.   Chest x-ray shows no pneumonia, pneumothorax or pleural effusion.   EKG shows no acute ST-T wave changes, first 2 sets of high-sensitivity troponins were 13, 13.  Patient was started on a trial of Solu-Medrol to see if this helps out his pains.  ANA negative.   Dr. Lucky Cowboy did a angiogram and placed a stent in the superior mesenteric artery.  Patient's epigastric pain improved after this.  Patient on aspirin.  Patient complaining of rectal bleeding.  Reviewed recent colonoscopy and flexible sigmoidoscopy and the patient does have some bleeding hemorrhoids.  Anusol suppositories ordered.  Assessment and Plan: * Mesenteric artery stenosis (HCC) Mesenteric ischemia.  Dr. Lucky Cowboy placed a stent in the SMA on 10/24/2022.  Hold aspirin today.  We will have to restart prior to disposition.  We will get an arterial ultrasound of right groin to rule out pseudo aneurysm.  Hemorrhoids, internal, with bleeding We will give Anusol suppositories.  Reviewed prior flexible sigmoidoscopy and colonoscopy.  Case discussed with gastroenterology and no need for repeat procedure at this point.  Case discussed with general  surgery.  Hyperkalemia Improved  Epigastric pain Seems to improved after placing a stent in the SMA.  Also reviewed endoscopy from 05/30/2022 showing esophagitis and portal hypertensive gastropathy and duodenitis.  We will continue empiric PPI.  Chest pain Cardiac enzymes negative.  Patient had a recent stress test that was a low risk scan.  Test was consistent with prior myocardial infarction.  No ischemia seen.  Solu-Medrol seem to help a little bit of his pains, will change to prednisone..  ANA negative.  Rheumatoid factor slightly elevated.  CAD (coronary artery disease) On Crestor, hold aspirin today.  Iron deficiency anemia Last hemoglobin 8.1.  Continue to monitor hemoglobin.  IV iron was given in the hospital 10/25/2022.  Prolonged QT interval Monitoring on telemetry  Alcohol dependence ([place]) No signs of withdrawal.  Continue folate thiamine.        Subjective: Patient with a few episodes of bleeding since I saw him yesterday.  Mostly bright red blood but also some darker stool.  Patient complains of pain in the right groin at the site of where the did the procedure.  Physical Exam: Vitals:   10/25/22 2006 10/26/22 0458 10/26/22 1002 10/26/22 1004  BP: 126/71 112/68 122/66 122/66  Pulse: 95 91  82  Resp: '17 17  16  '$ Temp: 97.7 F (36.5 C) 98.1 F (36.7 C)    TempSrc:      SpO2: 98% 98%  100%  Weight:      Height:       Physical Exam HENT:     Head: Normocephalic.     Mouth/Throat:     Pharynx:  No oropharyngeal exudate.  Eyes:     General: Lids are normal.     Conjunctiva/sclera: Conjunctivae normal.  Cardiovascular:     Rate and Rhythm: Normal rate and regular rhythm.     Heart sounds: Normal heart sounds, S1 normal and S2 normal.  Abdominal:     Palpations: Abdomen is soft.     Tenderness: There is no abdominal tenderness.  Musculoskeletal:     Right lower leg: No swelling.     Left lower leg: No swelling.  Skin:    General: Skin is warm.      Comments: Bruising around abdomen both sides.  Neurological:     Mental Status: He is alert and oriented to person, place, and time.     Data Reviewed: Hemoglobin dipped down to 8.1.  Potassium 4.4, creatinine 0.67  Disposition: Status is: Inpatient Remains inpatient appropriate because: Patient with rectal bleeding today.  Hold aspirin and heparin subcutaneous hydrate fluids.  Check hemoglobin and type and cross tomorrow.  Case discussed with GI and general surgery.  With pain in the groin we will get a arterial ultrasound of the right groin.  Planned Discharge Destination: Home    Time spent: 29 minutes Case discussed with gastroenterology and general surgery.  Author: Loletha Grayer, MD 10/26/2022 1:40 PM  For on call review [email or web address].

## 2022-10-27 DIAGNOSIS — K648 Other hemorrhoids: Secondary | ICD-10-CM | POA: Diagnosis not present

## 2022-10-27 DIAGNOSIS — R1013 Epigastric pain: Secondary | ICD-10-CM | POA: Diagnosis not present

## 2022-10-27 DIAGNOSIS — K551 Chronic vascular disorders of intestine: Secondary | ICD-10-CM | POA: Diagnosis not present

## 2022-10-27 DIAGNOSIS — R079 Chest pain, unspecified: Secondary | ICD-10-CM | POA: Diagnosis not present

## 2022-10-27 LAB — TYPE AND SCREEN
ABO/RH(D): A POS
Antibody Screen: NEGATIVE

## 2022-10-27 LAB — CBC
HCT: 26.1 % — ABNORMAL LOW (ref 39.0–52.0)
Hemoglobin: 8.2 g/dL — ABNORMAL LOW (ref 13.0–17.0)
MCH: 29.7 pg (ref 26.0–34.0)
MCHC: 31.4 g/dL (ref 30.0–36.0)
MCV: 94.6 fL (ref 80.0–100.0)
Platelets: 133 10*3/uL — ABNORMAL LOW (ref 150–400)
RBC: 2.76 MIL/uL — ABNORMAL LOW (ref 4.22–5.81)
RDW: 20.8 % — ABNORMAL HIGH (ref 11.5–15.5)
WBC: 5.5 10*3/uL (ref 4.0–10.5)
nRBC: 0 % (ref 0.0–0.2)

## 2022-10-27 LAB — BASIC METABOLIC PANEL
Anion gap: 2 — ABNORMAL LOW (ref 5–15)
BUN: 10 mg/dL (ref 6–20)
CO2: 25 mmol/L (ref 22–32)
Calcium: 8.8 mg/dL — ABNORMAL LOW (ref 8.9–10.3)
Chloride: 112 mmol/L — ABNORMAL HIGH (ref 98–111)
Creatinine, Ser: 0.72 mg/dL (ref 0.61–1.24)
GFR, Estimated: >60 mL/min (ref >60)
Glucose, Bld: 100 mg/dL — ABNORMAL HIGH (ref 70–99)
Potassium: 4.1 mmol/L (ref 3.5–5.1)
Sodium: 139 mmol/L (ref 135–145)

## 2022-10-27 MED ORDER — FLEET ENEMA 7-19 GM/118ML RE ENEM
1.0000 | ENEMA | Freq: Once | RECTAL | Status: AC
  Administered 2022-10-28: 1 via RECTAL

## 2022-10-27 MED ORDER — NICOTINE 14 MG/24HR TD PT24
14.0000 mg | MEDICATED_PATCH | Freq: Every day | TRANSDERMAL | Status: DC
  Administered 2022-10-27 – 2022-11-08 (×13): 14 mg via TRANSDERMAL
  Filled 2022-10-27 (×13): qty 1

## 2022-10-27 MED ORDER — SODIUM CHLORIDE 0.9 % IV SOLN
1.0000 g | INTRAVENOUS | Status: AC
  Administered 2022-10-28: 1 g via INTRAVENOUS
  Filled 2022-10-27: qty 1

## 2022-10-27 MED ORDER — SODIUM CHLORIDE 0.9 % IV SOLN
300.0000 mg | Freq: Once | INTRAVENOUS | Status: AC
  Administered 2022-10-27: 300 mg via INTRAVENOUS
  Filled 2022-10-27: qty 300

## 2022-10-27 NOTE — Progress Notes (Signed)
10/27/2022  Subjective: Patient had another episode of bright red blood per rectum yesterday after bowel movement.  He still off aspirin due to the bleed.  Denies any worsening pain, nausea, vomiting.  Hemoglobin is stable this morning at 8.2.  Vital signs: Temp:  [98 F (36.7 C)-98.2 F (36.8 C)] 98 F (36.7 C) (11/12 0736) Pulse Rate:  [88-94] 88 (11/12 0736) Resp:  [17-20] 20 (11/12 0736) BP: (97-110)/(57-60) 107/58 (11/12 0736) SpO2:  [97 %-100 %] 100 % (11/12 0736)   Intake/Output: 11/11 0701 - 11/12 0700 In: 840 [P.O.:840] Out: 750 [Urine:750] Last BM Date : 10/25/22  Physical Exam: Constitutional: No acute distress Rectal: Exam deferred today.  Labs:  Recent Labs    10/25/22 0339 10/26/22 0620 10/27/22 0534  WBC 9.5  --  5.5  HGB 8.6* 8.1* 8.2*  HCT 26.8*  --  26.1*  PLT 150  --  133*   Recent Labs    10/26/22 0620 10/27/22 0534  NA 139 139  K 4.4 4.1  CL 112* 112*  CO2 24 25  GLUCOSE 142* 100*  BUN 12 10  CREATININE 0.67 0.72  CALCIUM 8.5* 8.8*   No results for input(s): "LABPROT", "INR" in the last 72 hours.  Imaging: No results found.  Assessment/Plan: This is a 51 y.o. male with bleeding internal hemorrhoids.  - Discussed with patient that since he still having bleeding episodes and there is a need to start him on aspirin due to the SMA stent that he he just recently had placed 3 days ago, would be better to proceed with exam under anesthesia and hemorrhoidectomy to resect those internal bleeding hemorrhoids.  Discussed with him that we will at the same time resect the mildly enlarged external components as well.  Reviewed with him the procedure at length including the risks of bleeding, infection, injury to surrounding structures, postoperative pain issues with the surgery, postoperative instructions, pain control, he is willing to proceed. - We will add him to the schedule for tomorrow pending or/anesthesia team availability. - We will make him  n.p.o. after midnight and consent order has been placed as well.   I spent 35 minutes dedicated to the care of this patient on the date of this encounter to include pre-visit review of records, face-to-face time with the patient discussing diagnosis and management, and any post-visit coordination of care.  Melvyn Neth, [facility]

## 2022-10-27 NOTE — Progress Notes (Signed)
Progress Note   [name] DOB: 03/16/1971 DOA: 10/21/2022     6 DOS: the patient was seen and examined on 10/27/2022   Brief hospital course: Ethan Wells is a 51 y.o. male with medical history significant of polysubstance abuse, coronary artery disease, anemia due to hemmoroids, QT prolongation chronic pain syndrome, who presents to the ER today on account of generalized body aches and pains.  At the ER he was found to have potassium of 2.2 subsequently started on IV potassium.  Patient is a poor historian. During my encounter patient was complaining of pain in his arm due to the IV potassium. He endorses recent use of cocaine and alcohol.   Labs concerning for hyponatremia sodium 133, potassium 2.2, magnesium 1.7, hemoglobin 8.8.   Chest x-ray shows no pneumonia, pneumothorax or pleural effusion.   EKG shows no acute ST-T wave changes, first 2 sets of high-sensitivity troponins were 13, 13.  Patient was started on a trial of Solu-Medrol to see if this helps out his pains.  ANA negative.   Dr. Lucky Cowboy did a angiogram and placed a stent in the superior mesenteric artery.  Patient's epigastric pain improved after this.  Patient on aspirin.  Patient complaining of rectal bleeding.  Reviewed recent colonoscopy and flexible sigmoidoscopy and the patient does have some bleeding hemorrhoids.  Anusol suppositories ordered.  Assessment and Plan: * Hemorrhoids, internal, with bleeding We will give Anusol rectal cream.  Reviewed prior flexible sigmoidoscopy and colonoscopy.  Case discussed with gastroenterology and no need for repeat procedure at this point.  Case discussed with general surgery and they will proceed with hemorrhoidal banding procedure tomorrow.  Mesenteric artery stenosis (HCC) Mesenteric ischemia.  Dr. Lucky Cowboy placed a stent in the SMA on 10/24/2022.  Hold aspirin today.  We will have to restart prior to disposition.  Arterial ultrasound negative for  pseudoaneurysm.  Epigastric pain Seems to improved after placing a stent in the SMA.  Also reviewed endoscopy from 05/30/2022 showing esophagitis and portal hypertensive gastropathy and duodenitis.  We will continue empiric PPI.  Chest pain Cardiac enzymes negative.  Patient had a recent stress test that was a low risk scan.  Test was consistent with prior myocardial infarction.  No ischemia seen.  Solu-Medrol seem to help a little bit of his pains, will change to prednisone.  ANA negative.  Rheumatoid factor slightly elevated.  CAD (coronary artery disease) On Crestor, hold aspirin today.  Hyperkalemia Improved  Iron deficiency anemia Last hemoglobin 8.2.  Continue to monitor hemoglobin.  IV iron was given in the hospital 10/25/2022 and will give again today.  Prolonged QT interval Monitoring on telemetry  Alcohol dependence ([place]) No signs of withdrawal.  Continue folate thiamine.        Subjective: Patient complains of rectal bleeding still.  States he had 2 episodes this morning.  Still having joint pains.  Does not feel well.  Initially came in with chest pain and epigastric pain  Physical Exam: Vitals:   10/26/22 1004 10/26/22 2200 10/27/22 0539 10/27/22 0736  BP: 122/66 110/60 (!) 97/57 (!) 107/58  Pulse: 82 94 89 88  Resp: '16 17 17 20  '$ Temp:  98.2 F (36.8 C) 98 F (36.7 C) 98 F (36.7 C)  TempSrc:  Oral Oral   SpO2: 100% 99% 97% 100%  Weight:      Height:       Physical Exam HENT:     Head: Normocephalic.     Mouth/Throat:  Pharynx: No oropharyngeal exudate.  Eyes:     General: Lids are normal.     Conjunctiva/sclera: Conjunctivae normal.  Cardiovascular:     Rate and Rhythm: Normal rate and regular rhythm.     Heart sounds: Normal heart sounds, S1 normal and S2 normal.  Pulmonary:     Breath sounds: Normal breath sounds. No decreased breath sounds, wheezing, rhonchi or rales.  Abdominal:     Palpations: Abdomen is soft.     Tenderness: There is  no abdominal tenderness.  Musculoskeletal:     Right lower leg: No swelling.     Left lower leg: No swelling.  Skin:    General: Skin is warm.     Comments: Bruising around abdomen both sides.  Neurological:     Mental Status: He is alert and oriented to person, place, and time.     Data Reviewed: Creatinine 0.72, hemoglobin 8.2, platelets 133  Disposition: Status is: Inpatient Remains inpatient appropriate because: Patient to go to the operating room for hemorrhoidal banding procedure tomorrow  Planned Discharge Destination: Home    Time spent: 27 minutes Case discussed with general surgery.  Author: Loletha Grayer, MD 10/27/2022 1:11 PM  For on call review [email or web address].

## 2022-10-28 ENCOUNTER — Inpatient Hospital Stay: Payer: Medicare HMO | Admitting: Anesthesiology

## 2022-10-28 ENCOUNTER — Encounter
Admission: EM | Disposition: A | Discharge status: Home / Self Care | Payer: Self-pay | Source: Home / Self Care | Attending: Internal Medicine

## 2022-10-28 ENCOUNTER — Other Ambulatory Visit: Payer: Self-pay

## 2022-10-28 DIAGNOSIS — K644 Residual hemorrhoidal skin tags: Secondary | ICD-10-CM | POA: Diagnosis not present

## 2022-10-28 DIAGNOSIS — K551 Chronic vascular disorders of intestine: Secondary | ICD-10-CM | POA: Diagnosis not present

## 2022-10-28 DIAGNOSIS — R079 Chest pain, unspecified: Secondary | ICD-10-CM | POA: Diagnosis not present

## 2022-10-28 DIAGNOSIS — R1013 Epigastric pain: Secondary | ICD-10-CM | POA: Diagnosis not present

## 2022-10-28 DIAGNOSIS — R768 Other specified abnormal immunological findings in serum: Secondary | ICD-10-CM | POA: Diagnosis present

## 2022-10-28 DIAGNOSIS — K648 Other hemorrhoids: Secondary | ICD-10-CM | POA: Diagnosis not present

## 2022-10-28 LAB — URINE DRUG SCREEN, QUALITATIVE (ARMC ONLY)
Amphetamines, Ur Screen: NOT DETECTED
Barbiturates, Ur Screen: NOT DETECTED
Benzodiazepine, Ur Scrn: NOT DETECTED
Cannabinoid 50 Ng, Ur ~~LOC~~: NOT DETECTED
Cocaine Metabolite,Ur ~~LOC~~: NOT DETECTED
MDMA (Ecstasy)Ur Screen: NOT DETECTED
Methadone Scn, Ur: NOT DETECTED
Opiate, Ur Screen: POSITIVE — AB
Phencyclidine (PCP) Ur S: NOT DETECTED
Tricyclic, Ur Screen: NOT DETECTED

## 2022-10-28 SURGERY — EXAM UNDER ANESTHESIA
Anesthesia: General | Site: Anus

## 2022-10-28 MED ORDER — HYDROMORPHONE HCL 1 MG/ML IJ SOLN
INTRAMUSCULAR | Status: AC
  Filled 2022-10-28: qty 1

## 2022-10-28 MED ORDER — BUPIVACAINE LIPOSOME 1.3 % IJ SUSP
INTRAMUSCULAR | Status: AC
  Filled 2022-10-28: qty 20

## 2022-10-28 MED ORDER — OXYCODONE HCL 5 MG PO TABS
5.0000 mg | ORAL_TABLET | Freq: Once | ORAL | Status: AC | PRN
  Administered 2022-10-28: 5 mg via ORAL

## 2022-10-28 MED ORDER — PHENYLEPHRINE HCL (PRESSORS) 10 MG/ML IV SOLN
INTRAVENOUS | Status: DC | PRN
  Administered 2022-10-28: 160 ug via INTRAVENOUS

## 2022-10-28 MED ORDER — DEXMEDETOMIDINE HCL IN NACL 80 MCG/20ML IV SOLN
INTRAVENOUS | Status: DC | PRN
  Administered 2022-10-28: 12 ug via BUCCAL

## 2022-10-28 MED ORDER — LIDOCAINE HCL (CARDIAC) PF 100 MG/5ML IV SOSY
PREFILLED_SYRINGE | INTRAVENOUS | Status: DC | PRN
  Administered 2022-10-28: 100 mg via INTRAVENOUS

## 2022-10-28 MED ORDER — GELATIN ABSORBABLE 12-7 MM EX MISC
CUTANEOUS | Status: AC
  Filled 2022-10-28: qty 2

## 2022-10-28 MED ORDER — PROPOFOL 10 MG/ML IV BOLUS
INTRAVENOUS | Status: AC
  Filled 2022-10-28: qty 20

## 2022-10-28 MED ORDER — MIDAZOLAM HCL 2 MG/2ML IJ SOLN
INTRAMUSCULAR | Status: DC | PRN
  Administered 2022-10-28: 2 mg via INTRAVENOUS

## 2022-10-28 MED ORDER — IBUPROFEN 800 MG PO TABS: 800.0000 mg | ORAL_TABLET | Freq: Three times a day (TID) | ORAL | 1 refills | Status: DC | PRN

## 2022-10-28 MED ORDER — KETAMINE HCL 10 MG/ML IJ SOLN
INTRAMUSCULAR | Status: DC | PRN
  Administered 2022-10-28: 50 mg via INTRAVENOUS

## 2022-10-28 MED ORDER — OXYCODONE HCL 5 MG PO TABS: 5.0000 mg | ORAL_TABLET | ORAL | 0 refills | Status: DC | PRN

## 2022-10-28 MED ORDER — OXYCODONE HCL 5 MG/5ML PO SOLN: 5.0000 mg | Freq: Once | ORAL | Status: AC | PRN

## 2022-10-28 MED ORDER — ACETAMINOPHEN 500 MG PO TABS: 1000.0000 mg | ORAL_TABLET | Freq: Four times a day (QID) | ORAL | Status: DC | PRN

## 2022-10-28 MED ORDER — MIDAZOLAM HCL 2 MG/2ML IJ SOLN
INTRAMUSCULAR | Status: AC
  Filled 2022-10-28: qty 2

## 2022-10-28 MED ORDER — DEXAMETHASONE SODIUM PHOSPHATE 10 MG/ML IJ SOLN
INTRAMUSCULAR | Status: DC | PRN
  Administered 2022-10-28: 5 mg via INTRAVENOUS

## 2022-10-28 MED ORDER — BUPIVACAINE-EPINEPHRINE (PF) 0.5% -1:200000 IJ SOLN
INTRAMUSCULAR | Status: AC
  Filled 2022-10-28: qty 30

## 2022-10-28 MED ORDER — 0.9 % SODIUM CHLORIDE (POUR BTL) OPTIME
TOPICAL | Status: DC | PRN
  Administered 2022-10-28: 500 mL

## 2022-10-28 MED ORDER — LIDOCAINE 5 % EX OINT: 1.0000 | TOPICAL_OINTMENT | Freq: Four times a day (QID) | CUTANEOUS | 0 refills | Status: DC | PRN

## 2022-10-28 MED ORDER — SEVOFLURANE IN SOLN
RESPIRATORY_TRACT | Status: AC
  Filled 2022-10-28: qty 250

## 2022-10-28 MED ORDER — GELATIN ABSORBABLE 100 CM EX MISC
CUTANEOUS | Status: AC
  Filled 2022-10-28: qty 1

## 2022-10-28 MED ORDER — GELATIN ABSORBABLE 100 EX MISC
CUTANEOUS | Status: DC | PRN
  Administered 2022-10-28: 1

## 2022-10-28 MED ORDER — KETOROLAC TROMETHAMINE 30 MG/ML IJ SOLN
30.0000 mg | Freq: Four times a day (QID) | INTRAMUSCULAR | Status: DC
  Administered 2022-10-28 – 2022-11-01 (×16): 30 mg via INTRAVENOUS
  Filled 2022-10-28 (×17): qty 1

## 2022-10-28 MED ORDER — FENTANYL CITRATE (PF) 100 MCG/2ML IJ SOLN
INTRAMUSCULAR | Status: DC | PRN
  Administered 2022-10-28 (×2): 50 ug via INTRAVENOUS

## 2022-10-28 MED ORDER — LACTATED RINGERS IV SOLN: INTRAVENOUS | Status: DC | PRN

## 2022-10-28 MED ORDER — OXYCODONE HCL 5 MG PO TABS
ORAL_TABLET | ORAL | Status: AC
  Filled 2022-10-28: qty 1

## 2022-10-28 MED ORDER — PROPOFOL 10 MG/ML IV BOLUS
INTRAVENOUS | Status: DC | PRN
  Administered 2022-10-28: 50 mg via INTRAVENOUS
  Administered 2022-10-28: 150 mg via INTRAVENOUS

## 2022-10-28 MED ORDER — HYDROMORPHONE HCL 1 MG/ML IJ SOLN
0.2500 mg | INTRAMUSCULAR | Status: DC | PRN
  Administered 2022-10-28 (×4): 0.5 mg via INTRAVENOUS

## 2022-10-28 MED ORDER — KETAMINE HCL 50 MG/5ML IJ SOSY
PREFILLED_SYRINGE | INTRAMUSCULAR | Status: AC
  Filled 2022-10-28: qty 5

## 2022-10-28 MED ORDER — ONDANSETRON HCL 4 MG/2ML IJ SOLN
INTRAMUSCULAR | Status: DC | PRN
  Administered 2022-10-28: 4 mg via INTRAVENOUS

## 2022-10-28 MED ORDER — HYDROMORPHONE HCL 1 MG/ML IJ SOLN
0.5000 mg | INTRAMUSCULAR | Status: DC | PRN
  Administered 2022-10-28 – 2022-10-31 (×12): 0.5 mg via INTRAVENOUS
  Filled 2022-10-28: qty 1
  Filled 2022-10-28: qty 0.5
  Filled 2022-10-28 (×6): qty 1
  Filled 2022-10-28: qty 0.5
  Filled 2022-10-28 (×2): qty 1
  Filled 2022-10-28: qty 0.5

## 2022-10-28 MED ORDER — OXYCODONE HCL 5 MG PO TABS
5.0000 mg | ORAL_TABLET | ORAL | Status: DC | PRN
  Administered 2022-10-29: 5 mg via ORAL
  Administered 2022-10-29 – 2022-10-31 (×5): 10 mg via ORAL
  Administered 2022-10-31: 5 mg via ORAL
  Administered 2022-10-31 – 2022-11-03 (×13): 10 mg via ORAL
  Filled 2022-10-28 (×6): qty 2
  Filled 2022-10-28: qty 1
  Filled 2022-10-28 (×6): qty 2
  Filled 2022-10-28: qty 1
  Filled 2022-10-28 (×3): qty 2
  Filled 2022-10-28: qty 1
  Filled 2022-10-28 (×2): qty 2
  Filled 2022-10-28: qty 1
  Filled 2022-10-28 (×2): qty 2

## 2022-10-28 MED ORDER — FENTANYL CITRATE (PF) 100 MCG/2ML IJ SOLN
INTRAMUSCULAR | Status: AC
  Filled 2022-10-28: qty 2

## 2022-10-28 MED ORDER — HYDROMORPHONE HCL 1 MG/ML IJ SOLN
INTRAMUSCULAR | Status: DC | PRN
  Administered 2022-10-28 (×2): 1 mg via INTRAVENOUS

## 2022-10-28 MED ORDER — BUPIVACAINE-EPINEPHRINE 0.5% -1:200000 IJ SOLN
INTRAMUSCULAR | Status: DC | PRN
  Administered 2022-10-28: 50 mL via SURGICAL_CAVITY

## 2022-10-28 SURGICAL SUPPLY — 36 items
BRIEF MESH DISP 2XL (UNDERPADS AND DIAPERS) ×1 IMPLANT
COVER LIGHT HANDLE STERIS (MISCELLANEOUS) IMPLANT
DRAPE PERI LITHO V/GYN (MISCELLANEOUS) ×1 IMPLANT
DRAPE UNDER BUTTOCK W/FLU (DRAPES) ×1 IMPLANT
DRSG GAUZE FLUFF 36X18 (GAUZE/BANDAGES/DRESSINGS) ×1 IMPLANT
ELECT CAUTERY BLADE TIP 2.5 (TIP) ×1 IMPLANT
ELECT REM PT RETURN 9FT ADLT (ELECTROSURGICAL) ×1 IMPLANT
ELECTRODE CAUTERY BLDE TIP 2.5 (TIP) ×1 IMPLANT
ELECTRODE REM PT RTRN 9FT ADLT (ELECTROSURGICAL) ×1 IMPLANT
GAUZE 4X4 16PLY ~~LOC~~+RFID DBL (SPONGE) ×1 IMPLANT
GLOVE SURG SYN 7.0 (GLOVE) ×1 IMPLANT
GLOVE SURG SYN 7.0 PF PI (GLOVE) ×1 IMPLANT
GLOVE SURG SYN 7.5  E (GLOVE) ×1
GLOVE SURG SYN 7.5 E (GLOVE) ×1 IMPLANT
GLOVE SURG SYN 7.5 PF PI (GLOVE) ×1 IMPLANT
GOWN STRL REUS W/ TWL LRG LVL3 (GOWN DISPOSABLE) ×2 IMPLANT
GOWN STRL REUS W/TWL LRG LVL3 (GOWN DISPOSABLE) ×2
KIT TURNOVER KIT A (KITS) ×1 IMPLANT
LABEL OR SOLS (LABEL) ×1 IMPLANT
MANIFOLD NEPTUNE II (INSTRUMENTS) ×1 IMPLANT
NEEDLE HYPO 22GX1.5 SAFETY (NEEDLE) ×1 IMPLANT
NS IRRIG 500ML POUR BTL (IV SOLUTION) ×1 IMPLANT
PACK BASIN MINOR ARMC (MISCELLANEOUS) ×1 IMPLANT
PAD OB MATERNITY 4.3X12.25 (PERSONAL CARE ITEMS) ×1 IMPLANT
PAD PREP 24X41 OB/GYN DISP (PERSONAL CARE ITEMS) ×1 IMPLANT
SHEARS HARMONIC 9CM CVD (BLADE) ×1 IMPLANT
SOL PREP PVP 2OZ (MISCELLANEOUS) ×1 IMPLANT
SOLUTION PREP PVP 2OZ (MISCELLANEOUS) ×1 IMPLANT
SURGILUBE 2OZ TUBE FLIPTOP (MISCELLANEOUS) ×1 IMPLANT
SUT VIC AB 2-0 SH 27 (SUTURE) ×2
SUT VIC AB 2-0 SH 27XBRD (SUTURE) ×2 IMPLANT
SYR 10ML LL (SYRINGE) ×1 IMPLANT
SYR BULB IRRIG 60ML STRL (SYRINGE) ×1 IMPLANT
TAPE TRANSPORE STRL 2 31045 (GAUZE/BANDAGES/DRESSINGS) IMPLANT
TRAP FLUID SMOKE EVACUATOR (MISCELLANEOUS) ×1 IMPLANT
WATER STERILE IRR 500ML POUR (IV SOLUTION) ×1 IMPLANT

## 2022-10-28 NOTE — Anesthesia Postprocedure Evaluation (Signed)
Anesthesia Post Note  [name]  Procedure(s) Performed: EXAM UNDER ANESTHESIA WITH HEMORRHOIDECTOMY (Anus)  Patient location during evaluation: PACU Anesthesia Type: General Level of consciousness: awake and alert Pain management: pain level controlled Vital Signs Assessment: post-procedure vital signs reviewed and stable Respiratory status: spontaneous breathing, nonlabored ventilation and respiratory function stable Cardiovascular status: blood pressure returned to baseline and stable Postop Assessment: no apparent nausea or vomiting Anesthetic complications: no   No notable events documented.   Last Vitals:  Vitals:   10/28/22 1906 10/28/22 2036  BP: (!) 147/95 129/77  Pulse: 100 98  Resp: 18 16  Temp: 36.6 C 36.6 C  SpO2: 99% 100%    Last Pain:  Vitals:   10/28/22 2036  TempSrc: Oral  PainSc:                  [place] Ouch

## 2022-10-28 NOTE — Assessment & Plan Note (Addendum)
 Will refer to rheumatology as outpatient.  Patient not the best candidate for methotrexate with history of drinking.  We will continue low-dose steroids for now.

## 2022-10-28 NOTE — Anesthesia Preprocedure Evaluation (Addendum)
Anesthesia Evaluation  Patient identified by MRN, date of birth, ID band Patient awake  General Assessment Comment:Patient presented with chest pain , cardiac workup negative. Had mesenteric angiogram on 11/9 with Dr. Lucky Cowboy with stenting to SMA. Started having rectal bleeding postprocedure   Reviewed: Allergy & Precautions, NPO status , Patient's Chart, lab work & pertinent test results  History of Anesthesia Complications Negative for: history of anesthetic complications  Airway Mallampati: III  TM Distance: >3 FB Neck ROM: full    Dental  (+) Poor Dentition, Edentulous Upper   Pulmonary Current Smoker and Patient abstained from smoking.   Pulmonary exam normal        Cardiovascular hypertension, + CAD, + Past MI, + Peripheral Vascular Disease and +CHF  Normal cardiovascular exam     Neuro/Psych  Headaches PSYCHIATRIC DISORDERS Anxiety Depression       GI/Hepatic ,GERD  Medicated,,(+)     substance abuse  alcohol use, cocaine use and marijuana use  Endo/Other  negative endocrine ROS    Renal/GU      Musculoskeletal   Abdominal   Peds  Hematology  (+) Blood dyscrasia, anemia   Anesthesia Other Findings Past Medical History: No date: CAD (coronary artery disease)     Comment:  a. 01/2011 Anterior STEMI/Cath/PCI: LM nl, LAD 100d               (3.5x51m Vision BMS placed), LCX 253mRI 50, RCA min               irregs, EF 40% w/ apical, inferoapical HK. No date: Cardiac arrest - ventricular fibrillation     Comment:  a. In setting of STEMI 01/2011 No date: Chronic pain No date: Cocaine abuse (HCMila DoceNo date: Depression with anxiety No date: ETOH abuse     Comment:  a. 6-12 beers/day No date: GERD (gastroesophageal reflux disease) No date: Hemorrhoids No date: Hyperlipidemia No date: Hypertension No date: Ischemic cardiomyopathy     Comment:  a. 06/2011 Echo: EF 45-50%, No rwma No date: Marijuana abuse No date:  Migraines No date: Tobacco abuse     Comment:  a. 1/2 ppd x 26 yrs  Past Surgical History: 08/09/2021: COLONOSCOPY     Comment:  Procedure: COLONOSCOPY;  Surgeon: WoLucilla LameMD;                Location: [facility] ENDOSCOPY;  Service: Endoscopy;; No date: CORONARY ANGIOPLASTY WITH STENT PLACEMENT 05/30/2022: ESOPHAGOGASTRODUODENOSCOPY; N/A     Comment:  Procedure: ESOPHAGOGASTRODUODENOSCOPY (EGD);  Surgeon:               VaLin LandsmanMD;  [facility];                Service: Gastroenterology;  Laterality: N/A; 03/01/2021: ESOPHAGOGASTRODUODENOSCOPY (EGD) WITH PROPOFOL; N/A     Comment:  Procedure: ESOPHAGOGASTRODUODENOSCOPY (EGD) WITH               PROPOFOL;  Surgeon: VaLin LandsmanMD;  [place] [facility];  Laterality:               N/A; 08/09/2021: ESOPHAGOGASTRODUODENOSCOPY (EGD) WITH PROPOFOL; N/A     Comment:  Procedure: ESOPHAGOGASTRODUODENOSCOPY (EGD) WITH               PROPOFOL;  Surgeon: WoLucilla LameMD;  [facility];  Service: Endoscopy;  Laterality: N/A; 10/02/2022: FLEXIBLE SIGMOIDOSCOPY; N/A     Comment:  Procedure: FLEXIBLE SIGMOIDOSCOPY;  Surgeon: Ladene Artist, MD;  Location: [place];  Service:               Gastroenterology;  Laterality: N/A; 10/24/2022: VISCERAL ANGIOGRAPHY; N/A     Comment:  Procedure: VISCERAL ANGIOGRAPHY;  Surgeon: Algernon Huxley,              MD;  Location: [place] CV LAB;  Service:               Cardiovascular;  Laterality: N/A;  BMI    Body Mass Index: 31.00 kg/m      Reproductive/Obstetrics negative OB ROS                             Anesthesia Physical Anesthesia Plan  ASA: 3  Anesthesia Plan: General LMA   Post-op Pain Management: Tylenol PO (pre-op)*, Toradol IV (intra-op)* and Dilaudid IV   Induction: Intravenous  PONV Risk Score and Plan: 2 and Dexamethasone, Ondansetron,  Midazolam and Treatment may vary due to age or medical condition  Airway Management Planned: LMA  Additional Equipment:   Intra-op Plan:   Post-operative Plan: Extubation in OR  Informed Consent: I have reviewed the patients History and Physical, chart, labs and discussed the procedure including the risks, benefits and alternatives for the proposed anesthesia with the patient or authorized representative who has indicated his/her understanding and acceptance.     Dental Advisory Given  Plan Discussed with: Anesthesiologist, CRNA and Surgeon  Anesthesia Plan Comments: (Patient consented for risks of anesthesia including but not limited to:  - adverse reactions to medications - damage to eyes, teeth, lips or other oral mucosa - nerve damage due to positioning  - sore throat or hoarseness - Damage to heart, brain, nerves, lungs, other parts of body or loss of life  Patient voiced understanding.)        Anesthesia Quick Evaluation

## 2022-10-28 NOTE — Care Management Important Message (Signed)
Important Message  Patient Details  Name: Ethan Wells MRN: [id number] Date of Birth: [birth year]   Medicare Important Message Given:  Yes     [name] 10/28/2022, 12:09 PM

## 2022-10-28 NOTE — Progress Notes (Signed)
Progress Note   [name] SEG:[id number] DOB: 03/07/1971 DOA: 10/21/2022     7 DOS: the patient was seen and examined on 10/28/2022   Brief hospital course: Ethan Wells is a 51 y.o. male with medical history significant of polysubstance abuse, coronary artery disease, anemia due to hemmoroids, QT prolongation chronic pain syndrome, who presents to the ER today on account of generalized body aches and pains.  At the ER he was found to have potassium of 2.2 subsequently started on IV potassium.  Patient is a poor historian. During my encounter patient was complaining of pain in his arm due to the IV potassium. He endorses recent use of cocaine and alcohol.   Labs concerning for hyponatremia sodium 133, potassium 2.2, magnesium 1.7, hemoglobin 8.8.   Chest x-ray shows no pneumonia, pneumothorax or pleural effusion.   EKG shows no acute ST-T wave changes, first 2 sets of high-sensitivity troponins were 13, 13.  Patient was started on a trial of Solu-Medrol to see if this helps out his pains.  ANA negative.   Dr. Lucky Cowboy did a angiogram and placed a stent in the superior mesenteric artery.  Patient's epigastric pain improved after this.  Patient on aspirin.  Patient complaining of rectal bleeding.  Reviewed recent colonoscopy and flexible sigmoidoscopy and the patient does have some bleeding hemorrhoids.  Anusol ordered.  For hemorrhoidal procedure on 10/28/2022 by Dr. Hampton Abbot  Assessment and Plan: * Hemorrhoids, internal, with bleeding We will give Anusol rectal cream.  Reviewed prior flexible sigmoidoscopy and colonoscopy.  Case discussed with gastroenterology and no need for repeat GI procedure at this point.  General surgery to take to the operating room today for hemorrhoidal procedure.  Mesenteric artery stenosis (HCC) Mesenteric ischemia.  Dr. Lucky Cowboy placed a stent in the SMA on 10/24/2022.  Hold aspirin today.  We will have to restart prior to disposition.  Arterial  ultrasound negative for pseudoaneurysm.  Epigastric pain Seems to improved after placing a stent in the SMA.  Also reviewed endoscopy from 05/30/2022 showing esophagitis and portal hypertensive gastropathy and duodenitis.  We will continue empiric PPI.  Chest pain Cardiac enzymes negative.  Patient had a recent stress test that was a low risk scan.  Test was consistent with prior myocardial infarction.  No ischemia seen.  Solu-Medrol seem to help a little bit of his pains, will change to prednisone.  ANA negative.  Rheumatoid factor slightly elevated.  CAD (coronary artery disease) On Crestor, hold aspirin today.  Hyperkalemia Improved  Elevated rheumatoid factor Will refer to hematology as outpatient.  Patient not the best candidate for methotrexate with history of drinking.  We will continue low-dose steroids for now.  Iron deficiency anemia Last hemoglobin 8.2.  Continue to monitor hemoglobin.  IV iron was given in the hospital 10/25/2022 and 10/27/22.  Prolonged QT interval Monitoring on telemetry  Alcohol dependence ([place]) No signs of withdrawal.  Continue folate thiamine.        Subjective: Patient still has a lot of pains throughout his body.  His epigastric pain is better after the stent.  Still having bleeding with his hemorrhoids.  Going to the operating room today for hemorrhoid procedure  Physical Exam: Vitals:   10/27/22 1641 10/27/22 2019 10/28/22 0406 10/28/22 0829  BP: (!) 119/57 111/62 117/70 110/74  Pulse: 90 88 91 86  Resp: '16 18 18 18  '$ Temp: 98 F (36.7 C) 97.7 F (36.5 C) 97.8 F (36.6 C) 97.7 F (36.5 C)  TempSrc:  Oral Oral Oral  SpO2: 98% 100% 99% 99%  Weight:      Height:       Physical Exam HENT:     Head: Normocephalic.     Mouth/Throat:     Pharynx: No oropharyngeal exudate.  Eyes:     General: Lids are normal.     Conjunctiva/sclera: Conjunctivae normal.  Cardiovascular:     Rate and Rhythm: Normal rate and regular rhythm.      Heart sounds: Normal heart sounds, S1 normal and S2 normal.  Pulmonary:     Breath sounds: Examination of the right-lower field reveals decreased breath sounds. Examination of the left-lower field reveals decreased breath sounds. Decreased breath sounds present. No wheezing, rhonchi or rales.  Abdominal:     Palpations: Abdomen is soft.     Tenderness: There is no abdominal tenderness.  Musculoskeletal:     Right lower leg: No swelling.     Left lower leg: No swelling.  Skin:    General: Skin is warm.     Comments: Bruising around abdomen both sides.  Neurological:     Mental Status: He is alert and oriented to person, place, and time.     Data Reviewed: No new data  Disposition: Status is: Inpatient Remains inpatient appropriate because: Going to the operating room for hemorrhoid procedure  Planned Discharge Destination: Home    Time spent: 27 minutes  Author: Loletha Grayer, MD 10/28/2022 12:24 PM  For on call review [email or web address].

## 2022-10-28 NOTE — Transfer of Care (Signed)
Immediate Anesthesia Transfer of Care Note  [name]  Procedure(s) Performed: Jasmine December UNDER ANESTHESIA WITH HEMORRHOIDECTOMY (Anus)  Patient Location: PACU  Anesthesia Type:General  Level of Consciousness: awake  Airway & Oxygen Therapy: Patient Spontanous Breathing  Post-op Assessment: Report given to RN and Post -op Vital signs reviewed and stable  Post vital signs: Reviewed and stable  Last Vitals:  Vitals Value Taken Time  BP 122/68 10/28/22 1805  Temp 36.3 C 10/28/22 1805  Pulse 112 10/28/22 1805  Resp 18 10/28/22 1805  SpO2 94 % 10/28/22 1805    Last Pain:  Vitals:   10/28/22 0922  TempSrc:   PainSc: 7          Complications: No notable events documented.

## 2022-10-28 NOTE — Anesthesia Procedure Notes (Signed)
Procedure Name: LMA Insertion Date/Time: 10/28/2022 4:15 PM  Performed by: Beverely Low, CRNAPre-anesthesia Checklist: Patient identified, Patient being monitored, Timeout performed, Emergency Drugs available and Suction available Patient Re-evaluated:Patient Re-evaluated prior to induction Oxygen Delivery Method: Circle system utilized Preoxygenation: Pre-oxygenation with 100% oxygen Induction Type: IV induction Ventilation: Mask ventilation without difficulty LMA: LMA inserted LMA Size: 4.0 Tube type: Oral Number of attempts: 1 Placement Confirmation: positive ETCO2 and breath sounds checked- equal and bilateral Tube secured with: Tape Dental Injury: Teeth and Oropharynx as per pre-operative assessment

## 2022-10-28 NOTE — Progress Notes (Signed)
[facility]  Daily Progress Note   Subjective  -   Patient is a 51 year old male who presented to [facility] regional hospital with a chief complaint of chest pain on 10/22/2022.  Patient has a previous medical history significant for polysubstance abuse, coronary artery disease, anemia, chronic pain syndrome.  On 10/24/2022 patient underwent mesenteric arteriogram.  No intervention was performed at that time.  Patient complains today of hemorrhoids and states he is going to the OR to have surgery for this.  Objective Vitals:   10/27/22 1641 10/27/22 2019 10/28/22 0406 10/28/22 0829  BP: (!) 119/57 111/62 117/70 110/74  Pulse: 90 88 91 86  Resp: '16 18 18 18  '$ Temp: 98 F (36.7 C) 97.7 F (36.5 C) 97.8 F (36.6 C) 97.7 F (36.5 C)  TempSrc:  Oral Oral Oral  SpO2: 98% 100% 99% 99%  Weight:      Height:        Intake/Output Summary (Last 24 hours) at 10/28/2022 1344 Last data filed at 10/28/2022 0300 Gross per 24 hour  Intake 480 ml  Output 1000 ml  Net -520 ml    PULM  CTAB CV  RRR VASC  no complications to note.  Abdomen remains soft and nontender  Laboratory CBC    Component Value Date/Time   WBC 5.5 10/27/2022 0534   HGB 8.2 (L) 10/27/2022 0534   HGB 15.0 09/06/2013 1458   HCT 26.1 (L) 10/27/2022 0534   HCT 42.2 09/06/2013 1458   PLT 133 (L) 10/27/2022 0534   PLT 270 09/06/2013 1458    BMET    Component Value Date/Time   NA 139 10/27/2022 0534   NA 134 (L) 09/06/2013 1458   K 4.1 10/27/2022 0534   K 3.5 09/06/2013 1458   CL 112 (H) 10/27/2022 0534   CL 102 09/06/2013 1458   CO2 25 10/27/2022 0534   CO2 23 09/06/2013 1458   GLUCOSE 100 (H) 10/27/2022 0534   GLUCOSE 167 (H) 09/06/2013 1458   BUN 10 10/27/2022 0534   BUN 4 (L) 09/06/2013 1458   CREATININE 0.72 10/27/2022 0534   CREATININE 0.49 (L) 09/23/2022 1034   CALCIUM 8.8 (L) 10/27/2022 0534   CALCIUM 8.9 09/06/2013 1458   GFRNONAA >60 10/27/2022 0534   GFRNONAA >60  09/06/2013 1458   GFRAA >60 06/05/2020 1109   GFRAA >60 09/06/2013 1458    Assessment/Planning: POD #4 s/p mesenteric angiogram  Patient has no complaints at this time we will continue to monitor for any abdominal/mesenteric difficulties post mesenteric angiogram.   Drema Pry  10/28/2022, 1:44 PM

## 2022-10-28 NOTE — Plan of Care (Signed)

## 2022-10-28 NOTE — Progress Notes (Signed)
Mobility Specialist - Progress Note  Post-mobility: SPO2 (99)   10/28/22 1100  Mobility  Activity Ambulated independently in hallway;Stood at bedside  Level of Assistance Independent  Assistive Device Front wheel walker  Distance Ambulated (ft) 160 ft  Activity Response Tolerated well  Mobility Referral Yes  $Mobility charge 1 Mobility   Pt aroused to knock and voice in bed on RA upon entry. Pt STS and ambulates indep. Pt expressed not feeling well but, still motivated to participate in mobility. Pt expressed SOB; returned to bed and took a 2-3 minute break EOB before laying back down. Pt left with needs in reach.   Loma Sender Mobility Specialist 10/28/22, 11:12 AM

## 2022-10-28 NOTE — Progress Notes (Signed)
[facility] Day(s): 7.   Post op day(s): 4 Days Post-Op.   Interval History:  Patient seen and examined No acute events or new complaints overnight.  Patient reports he is doing better; still some blood  No pain, fever, chills No new labs this morning Plan for EUA and hemorrhoidectomy with Dr Hampton Abbot this afternoon  Vital signs in last 24 hours: [min-max] current  Temp:  [97.7 F (36.5 C)-98 F (36.7 C)] 97.7 F (36.5 C) (11/13 0829) Pulse Rate:  [86-91] 86 (11/13 0829) Resp:  [16-18] 18 (11/13 0829) BP: (110-119)/(57-74) 110/74 (11/13 0829) SpO2:  [98 %-100 %] 99 % (11/13 0829)     Height: '5\' 5"'$  (165.1 cm) Weight: 84.5 kg BMI (Calculated): 31   Intake/Output last 2 shifts:  11/12 0701 - 11/13 0700 In: 1224.6 [P.O.:960; IV Piggyback:264.6] Out: 2200 [Urine:2200]   Physical Exam:  Constitutional: alert, cooperative and no distress  Respiratory: breathing non-labored at rest  Cardiovascular: regular rate and sinus rhythm   Labs:     Latest Ref Rng & Units 10/27/2022    5:34 AM 10/26/2022    6:20 AM 10/25/2022    3:39 AM  CBC  WBC 4.0 - 10.5 K/uL 5.5   9.5   Hemoglobin 13.0 - 17.0 g/dL 8.2  8.1  8.6   Hematocrit 39.0 - 52.0 % 26.1   26.8   Platelets 150 - 400 K/uL 133   150       Latest Ref Rng & Units 10/27/2022    5:34 AM 10/26/2022    6:20 AM 10/25/2022    3:39 AM  CMP  Glucose 70 - 99 mg/dL 100  142  138   BUN 6 - 20 mg/dL '10  12  9   '$ Creatinine 0.61 - 1.24 mg/dL 0.72  0.67  0.68   Sodium 135 - 145 mmol/L 139  139  138   Potassium 3.5 - 5.1 mmol/L 4.1  4.4  5.4   Chloride 98 - 111 mmol/L 112  112  112   CO2 22 - 32 mmol/L '25  24  23   '$ Calcium 8.9 - 10.3 mg/dL 8.8  8.5  8.5      Imaging studies: No new pertinent imaging studies   Assessment/Plan:  51 y.o. male with bleeding internal hemorrhoids   - Plan for EUA and hemorrhoidectomy this afternoon with dr Hampton Abbot pending OR/Anesthesia availability.    - All risks, benefits, and alternatives to above procedure(s) were discussed with the patient, all of his questions were answered to his expressed satisfaction, patient expresses he wishes to proceed, and informed consent was obtained.    - NPO for planned procedure; Okay to resume diet post-op   - Pain control prn - Monitor rectal bleeding - Further management per primary service; we will follow    All of the above findings and recommendations were discussed with the patient, and the medical team, and all of patient's questions were answered to his expressed satisfaction.  -- Edison Simon, PA-C [facility] 10/28/2022, 8:58 AM M-F: 7am - 4pm

## 2022-10-28 NOTE — Discharge Instructions (Signed)
Discharge Instructions: 1. Please do Sitz baths twice daily and after bowel movements.  This will help soothe the raw tissue. 2. May start using lidocaine ointment on 10/31/22.  Please do not start it earlier than that as it may cause toxicity. 3. May start taking Miralax once daily to help prevent any constipation. 4. May apply fluffed gauze to the perianal area to catch any drainage.

## 2022-10-28 NOTE — Op Note (Signed)
Procedure Date:  10/28/2022  Pre-operative Diagnosis:  Bleeding internal hemorrhoids and enlarged external hemorrhoids  Post-operative Diagnosis: Bleeding internal hemorrhoids and enlarged external hemorrhoids  Procedure:  Exam under Anesthesia, Hemorrhoidectomy of all 3 columns  Surgeon:  Melvyn Neth, MD  Anesthesia:  General endotracheal  Estimated Blood Loss:  30 ml  Specimens: Right anterior column Right posterior column Left lateral column  Complications:  None  Findings:  Very friable internal hemorrhoids with very easy oozing.    Indications for Procedure:  This is a 51 y.o. male with a history of bleeding internal hemorrhoids, presenting for hemorrhoidectomy.  The risks of bleeding, infection, bowel injury, and need for further procedures were all discussed with the patient and he was willing to proceed.   Description of Procedure: The patient was correctly identified in the preoperative area and brought into the operating room.  The patient was placed supine with VTE prophylaxis in place.  Appropriate time-outs were performed.  Anesthesia was induced and the patient was intubated.  Appropriate antibiotics were infused.  The patient was then placed in high lithotomy position.   The perianal area was prepped and draped in usual sterile fashion.  The sphincter was digitally dilated.  Then the anoscope was inserted and the anal canal was evaluated, revealing enlarged internal hemorrhoids of all three columns.  They were friable and raw.  The bivalve retractor was then inserted, and we proceeding with excision of the right posterior column using combination of cautery and Harmonic.  Then similarly excised the left lateral column and then the right anterior column.  There was some oozing from the raw surfaces which was controlled with cautery.  Once complete, the anal canal was irrigated and he had good hemostasis.  50 ml of Exparel solution was infiltrated into the perianal area  and as bilateral pudendal blocks.  A large gelfoam gauze was rolled and inserted into the anal canal for further hemostasis.  The perianal area was cleaned and dressed with fluffed gauze and mesh underwear.  The patient was emerged from anesthesia and extubated and brought to the recovery room for further management.  The patient tolerated the procedure well and all counts were correct at the end of the case.   Melvyn Neth, MD

## 2022-10-29 ENCOUNTER — Inpatient Hospital Stay: Payer: Medicare HMO

## 2022-10-29 ENCOUNTER — Encounter: Payer: Self-pay | Admitting: Internal Medicine

## 2022-10-29 DIAGNOSIS — K648 Other hemorrhoids: Secondary | ICD-10-CM | POA: Diagnosis not present

## 2022-10-29 DIAGNOSIS — R0603 Acute respiratory distress: Secondary | ICD-10-CM | POA: Diagnosis not present

## 2022-10-29 DIAGNOSIS — I2489 Other forms of acute ischemic heart disease: Secondary | ICD-10-CM

## 2022-10-29 DIAGNOSIS — I5033 Acute on chronic diastolic (congestive) heart failure: Secondary | ICD-10-CM | POA: Diagnosis present

## 2022-10-29 DIAGNOSIS — K551 Chronic vascular disorders of intestine: Secondary | ICD-10-CM | POA: Diagnosis not present

## 2022-10-29 LAB — CBC
HCT: 26.8 % — ABNORMAL LOW (ref 39.0–52.0)
Hemoglobin: 8.3 g/dL — ABNORMAL LOW (ref 13.0–17.0)
MCH: 29.7 pg (ref 26.0–34.0)
MCHC: 31 g/dL (ref 30.0–36.0)
MCV: 96.1 fL (ref 80.0–100.0)
Platelets: 123 10*3/uL — ABNORMAL LOW (ref 150–400)
RBC: 2.79 MIL/uL — ABNORMAL LOW (ref 4.22–5.81)
RDW: 21.1 % — ABNORMAL HIGH (ref 11.5–15.5)
WBC: 6.3 10*3/uL (ref 4.0–10.5)
nRBC: 0 % (ref 0.0–0.2)

## 2022-10-29 LAB — BRAIN NATRIURETIC PEPTIDE: B Natriuretic Peptide: 1049.3 pg/mL — ABNORMAL HIGH (ref 0.0–100.0)

## 2022-10-29 LAB — BASIC METABOLIC PANEL
Anion gap: 5 (ref 5–15)
BUN: 11 mg/dL (ref 6–20)
CO2: 24 mmol/L (ref 22–32)
Calcium: 8.2 mg/dL — ABNORMAL LOW (ref 8.9–10.3)
Chloride: 110 mmol/L (ref 98–111)
Creatinine, Ser: 0.65 mg/dL (ref 0.61–1.24)
GFR, Estimated: >60 mL/min (ref >60)
Glucose, Bld: 146 mg/dL — ABNORMAL HIGH (ref 70–99)
Potassium: 4.7 mmol/L (ref 3.5–5.1)
Sodium: 139 mmol/L (ref 135–145)

## 2022-10-29 LAB — GLUCOSE, CAPILLARY
Glucose-Capillary: 179 mg/dL — ABNORMAL HIGH (ref 70–99)
Glucose-Capillary: 177 mg/dL — ABNORMAL HIGH (ref 70–99)

## 2022-10-29 LAB — TROPONIN I (HIGH SENSITIVITY)
Troponin I (High Sensitivity): 15 ng/L (ref <18)
Troponin I (High Sensitivity): 241 ng/L (ref <18)
Troponin I (High Sensitivity): 1607 ng/L (ref <18)
Troponin I (High Sensitivity): 1418 ng/L (ref <18)

## 2022-10-29 LAB — HEPARIN LEVEL (UNFRACTIONATED): Heparin Unfractionated: 0.2 IU/mL — ABNORMAL LOW (ref 0.30–0.70)

## 2022-10-29 MED ORDER — IPRATROPIUM-ALBUTEROL 0.5-2.5 (3) MG/3ML IN SOLN
3.0000 mL | Freq: Four times a day (QID) | RESPIRATORY_TRACT | Status: DC
  Administered 2022-10-29: 3 mL via RESPIRATORY_TRACT
  Filled 2022-10-29: qty 3

## 2022-10-29 MED ORDER — FUROSEMIDE 10 MG/ML IJ SOLN
40.0000 mg | Freq: Every day | INTRAMUSCULAR | Status: DC
  Administered 2022-10-30 – 2022-11-05 (×6): 40 mg via INTRAVENOUS
  Filled 2022-10-29 (×7): qty 4

## 2022-10-29 MED ORDER — PREDNISONE 20 MG PO TABS
20.0000 mg | ORAL_TABLET | Freq: Every day | ORAL | Status: DC
  Administered 2022-10-30 – 2022-11-07 (×8): 20 mg via ORAL
  Filled 2022-10-29 (×5): qty 1
  Filled 2022-10-29: qty 2
  Filled 2022-10-29 (×2): qty 1
  Filled 2022-10-29: qty 2

## 2022-10-29 MED ORDER — IOHEXOL 350 MG/ML SOLN
75.0000 mL | Freq: Once | INTRAVENOUS | Status: AC | PRN
  Administered 2022-10-29: 75 mL via INTRAVENOUS

## 2022-10-29 MED ORDER — MORPHINE SULFATE (PF) 2 MG/ML IV SOLN
1.0000 mg | INTRAVENOUS | Status: DC | PRN
  Administered 2022-10-30 (×3): 1 mg via INTRAVENOUS
  Filled 2022-10-29 (×4): qty 1

## 2022-10-29 MED ORDER — HEPARIN (PORCINE) 25000 UT/250ML-% IV SOLN
1500.0000 [IU]/h | INTRAVENOUS | Status: DC
  Administered 2022-10-29: 1100 [IU]/h via INTRAVENOUS
  Administered 2022-10-30: 1400 [IU]/h via INTRAVENOUS
  Filled 2022-10-29 (×2): qty 250

## 2022-10-29 MED ORDER — CHLORHEXIDINE GLUCONATE CLOTH 2 % EX PADS
6.0000 | MEDICATED_PAD | Freq: Every day | CUTANEOUS | Status: DC
  Administered 2022-10-29 – 2022-11-08 (×8): 6 via TOPICAL

## 2022-10-29 MED ORDER — ASPIRIN 81 MG PO TBEC
81.0000 mg | DELAYED_RELEASE_TABLET | Freq: Every day | ORAL | Status: DC
  Administered 2022-10-29 – 2022-11-08 (×9): 81 mg via ORAL
  Filled 2022-10-29 (×10): qty 1

## 2022-10-29 MED ORDER — FUROSEMIDE 10 MG/ML IJ SOLN
20.0000 mg | Freq: Once | INTRAMUSCULAR | Status: AC
  Administered 2022-10-29: 20 mg via INTRAVENOUS

## 2022-10-29 MED ORDER — METOPROLOL TARTRATE 25 MG PO TABS
12.5000 mg | ORAL_TABLET | Freq: Two times a day (BID) | ORAL | Status: DC
  Administered 2022-10-29 – 2022-11-03 (×9): 12.5 mg via ORAL
  Filled 2022-10-29 (×9): qty 1

## 2022-10-29 MED ORDER — NITROGLYCERIN 0.4 MG SL SUBL
0.4000 mg | SUBLINGUAL_TABLET | SUBLINGUAL | Status: DC | PRN
  Administered 2022-10-29 (×2): 0.4 mg via SUBLINGUAL
  Filled 2022-10-29 (×2): qty 1

## 2022-10-29 MED ORDER — FUROSEMIDE 10 MG/ML IJ SOLN
INTRAMUSCULAR | Status: AC
  Filled 2022-10-29: qty 4

## 2022-10-29 MED ORDER — FUROSEMIDE 10 MG/ML IJ SOLN
40.0000 mg | Freq: Once | INTRAMUSCULAR | Status: AC
  Administered 2022-10-29: 40 mg via INTRAVENOUS
  Filled 2022-10-29: qty 4

## 2022-10-29 MED ORDER — IPRATROPIUM BROMIDE 0.02 % IN SOLN
0.5000 mg | Freq: Three times a day (TID) | RESPIRATORY_TRACT | Status: DC
  Administered 2022-10-29 – 2022-11-01 (×7): 0.5 mg via RESPIRATORY_TRACT
  Filled 2022-10-29 (×7): qty 2.5

## 2022-10-29 MED ORDER — HEPARIN (PORCINE) 25000 UT/250ML-% IV SOLN: 1100.0000 [IU]/h | INTRAVENOUS | Status: DC

## 2022-10-29 MED ORDER — METHYLPREDNISOLONE SODIUM SUCC 40 MG IJ SOLR
40.0000 mg | Freq: Once | INTRAMUSCULAR | Status: AC
  Administered 2022-10-29: 40 mg via INTRAVENOUS
  Filled 2022-10-29: qty 1

## 2022-10-29 NOTE — Progress Notes (Addendum)
With second cardiac enzyme going up I discussed case with general surgery and they were okay with starting heparin drip without a bolus.  Aspirin given earlier.  Need to watch for signs of rectal bleeding with procedure yesterday.  Spoke with cardiology and will check another enzyme before deciding on heparin

## 2022-10-29 NOTE — Assessment & Plan Note (Signed)
Secondary to heart failure.  Requiring BiPAP overnight, not transition back to baseline oxygen requirement of 2 L. -Continue supplemental oxygen -Continue with BiPAP as needed

## 2022-10-29 NOTE — Consult Note (Signed)
Cardiology Consultation   Patient ID: Ethan Wells MRN: [id number]; DOB: July 18, 1971  Admit date: 10/21/2022 Date of Consult: 10/29/2022  PCP:  Nickola Major, NP   [place] [facility] Cardiologist:  Lauree Chandler, MD        [name] is a 51 y.o. male with a hx of polysubstance abuse, coronary artery disease, anemia due to hemorrhoids, QT prolongation, chronic pain syndrome who is being seen 10/29/2022 for the evaluation of sudden onset shortness of breath and chest pain at the request of Dr. Leslye Peer.  History of Present Illness:   Ethan Wells is a 51 year old male with a history of polysubstance abuse of cocaine and alcohol who endorses recent use, coronary artery disease with a bare-metal stent placed to the LAD in 01/2011, anemia due to hemorrhoids postoperative recent hemorrhoidectomy, QT prolongation, chronic pain syndrome, and chronic mesenteric ischemia with celiac and SMA stenosis with recent stent placement to the SMA.  He presented to the [facility] emergency department 10/21/2022 with complaints of chest pain and weakness.  He stated that throughout the day he had had an aching and throbbing chest pain.  He had associated shortness of breath and lightheadedness as well as palpitations.  He reports the pain is always in his chest but this chest pain is worse and similar to prior chest pain he had prior to stent placement.  He states that the discomfort does radiate toward his bilateral shoulders and down his arms.  There were no alleviating or aggravating factors.  On admission he was found to have potassium of 2.2 which was supplemented and risen to 3.1.  He had a CT angio of his abdomen pelvis that noted he had significant stenosis of the SMA and celiac arteries.  On 10/24/2022 he was taken to surgery by Dr. Lucky Cowboy for visceral angiography.  After his procedure he was evaluated by GI as he continued to have bleeding episodes  and there was a need to start him on aspirin due to the SMA stent that was just recently placed 3 days prior.  So he had an hemorrhoidectomy under anesthesia to resect internal bleeding hemorrhoids on 10/28/2022.  This morning he was evaluated by general surgery and was thought to be stable for discharge from the surgery standpoint.  Lunch there was a rapid response that was called by the patient's RN.  Patient was on supplemental oxygen sitting in the tripoding position with increased work of breathing significant shortness of breath.  He was given 40 mg of Lasix IV push followed with an additional 20 mg of Lasix.  He was placed on BiPAP by respiratory therapy and was transferred to the ICU.  It was also noted that patient had some complaints of chest pain with the shortness of breath.  Chest x-ray looks like pulmonary vascular congestion and cardiology was subsequently consulted.  Patient was evaluated in the ICU.  He continued on BiPAP at 80% FiO2.  Work of breathing was at ease on BiPAP and he was able to carry on conversation with continuing to wear his mask.  Oxygen saturations did not drop below 98% during the exam.  He was also able to sit forward for examination without difficulty.  He continues to have complaints of chest discomfort that has improved as he states that he always has some discomfort.  High-sensitivity troponins are pending.  Primary team has also reached out to surgery to determine if it would be safe to place patient on  a heparin drip for increasingly elevated high-sensitivity troponins.  Past Medical History:  Diagnosis Date   CAD (coronary artery disease)    a. 01/2011 Anterior STEMI/Cath/PCI: LM nl, LAD 100d (3.5x12m Vision BMS placed), LCX 225mRI 50, RCA min irregs, EF 40% w/ apical, inferoapical HK.   Cardiac arrest - ventricular fibrillation    a. In setting of STEMI 01/2011   Chronic pain    Cocaine abuse ([place]   Depression with anxiety    ETOH abuse    a. 6-12  beers/day   GERD (gastroesophageal reflux disease)    Hemorrhoids    Hyperlipidemia    Hypertension    Ischemic cardiomyopathy    a. 06/2011 Echo: EF 45-50%, No rwma   Marijuana abuse    Migraines    Tobacco abuse    a. 1/2 ppd x 26 yrs    Past Surgical History:  Procedure Laterality Date   COLONOSCOPY  08/09/2021   Procedure: COLONOSCOPY;  Surgeon: WoLucilla LameMD;  [facility];  Service: Endoscopy;;   CORONARY ANGIOPLASTY WITH STENT PLACEMENT     ESOPHAGOGASTRODUODENOSCOPY N/A 05/30/2022   Procedure: ESOPHAGOGASTRODUODENOSCOPY (EGD);  Surgeon: VaLin LandsmanMD;  [facility];  Service: Gastroenterology;  Laterality: N/A;   ESOPHAGOGASTRODUODENOSCOPY (EGD) WITH PROPOFOL N/A 03/01/2021   Procedure: ESOPHAGOGASTRODUODENOSCOPY (EGD) WITH PROPOFOL;  Surgeon: VaLin LandsmanMD;  [facility];  Laterality: N/A;   ESOPHAGOGASTRODUODENOSCOPY (EGD) WITH PROPOFOL N/A 08/09/2021   Procedure: ESOPHAGOGASTRODUODENOSCOPY (EGD) WITH PROPOFOL;  Surgeon: WoLucilla LameMD;  [facility];  Service: Endoscopy;  Laterality: N/A;   FLEXIBLE SIGMOIDOSCOPY N/A 10/02/2022   Procedure: FLEXIBLE SIGMOIDOSCOPY;  Surgeon: StLadene ArtistMD;  [facility];  Laterality: N/A;   VISCERAL ANGIOGRAPHY N/A 10/24/2022   Procedure: VISCERAL ANGIOGRAPHY;  Surgeon: DeAlgernon HuxleyMD;  [facility];  Service: Cardiovascular;  Laterality: N/A;     Home Medications:  Prior to Admission medications   Medication Sig Start Date End Date Taking? Authorizing Provider  acetaminophen (TYLENOL) 500 MG tablet Take 2 tablets (1,000 mg total) by mouth every 6 (six) hours as needed for mild pain. 10/28/22  Yes Piscoya, JoJacqulyn BathMD  aspirin 81 MG chewable tablet Chew 1 tablet (81 mg total) by mouth daily. 06/01/22  Yes Sreenath, SuTrula SladeMD  calcium-vitamin D (OSCAL WITH D) 500-5 MG-MCG tablet Take  1 tablet by mouth 2 (two) times daily. 10/17/22  Yes Medina-Vargas, Monina C, NP  diltiazem (CARDIZEM) 30 MG tablet Take 1 tablet (30 mg total) by mouth every 12 (twelve) hours. 10/03/22 11/02/22 Yes ChDonne HazelMD  gabapentin (NEURONTIN) 300 MG capsule Take 1 capsule (300 mg total) by mouth 3 (three) times daily. 10/17/22  Yes Medina-Vargas, Monina C, NP  ibuprofen (ADVIL) 800 MG tablet Take 1 tablet (800 mg total) by mouth every 8 (eight) hours as needed for moderate pain. 10/28/22  Yes Piscoya, Jose, MD  lidocaine (XYLOCAINE) 5 % ointment Apply 1 Application topically 4 (four) times daily as needed. Apply to the perianal area 10/28/22  Yes Piscoya, Jose, MD  oxyCODONE (OXY IR/ROXICODONE) 5 MG immediate release tablet Take 1 tablet (5 mg total) by mouth every 4 (four) hours as needed for severe pain. 10/28/22  Yes Piscoya, JoJacqulyn BathMD  pantoprazole (PROTONIX) 40 MG tablet Take 1 tablet (40 mg total) by mouth daily. 09/23/22  Yes Medina-Vargas, Monina C, NP  rosuvastatin (CRESTOR) 40 MG tablet Take 1 tablet (  40 mg total) by mouth daily. 09/23/22  Yes Medina-Vargas, Monina C, NP  methocarbamol (ROBAXIN-750) 750 MG tablet Take 1 tablet (750 mg total) by mouth daily as needed for muscle spasms. 10/17/22   Medina-Vargas, Monina C, NP    Inpatient Medications: Scheduled Meds:  aspirin EC  81 mg Oral Daily   calcium-vitamin D  1 tablet Oral BID   Chlorhexidine Gluconate Cloth  6 each Topical Daily   folic acid  1 mg Oral Daily   furosemide  40 mg Intravenous Once   [START ON 10/30/2022] furosemide  40 mg Intravenous Daily   gabapentin  300 mg Oral TID   hydrocortisone   Rectal BID   ipratropium-albuterol  3 mL Nebulization Q6H   ketorolac  30 mg Intravenous Q6H   magnesium oxide  400 mg Oral BID   metoprolol tartrate  12.5 mg Oral BID   multivitamin with minerals  1 tablet Oral Daily   nicotine  14 mg Transdermal Daily   pantoprazole  40 mg Oral BID   [START ON 10/30/2022] predniSONE  20 mg Oral  Q breakfast   psyllium  1 packet Oral Daily   rosuvastatin  40 mg Oral Daily   thiamine  100 mg Oral Daily   Continuous Infusions:  PRN Meds: acetaminophen, HYDROmorphone (DILAUDID) injection, methocarbamol, nitroGLYCERIN, oxyCODONE, polyethylene glycol  Allergies:    Allergies  Allergen Reactions   Penicillins Hives    Tolerated Ceftriaxone 10/2022   Other Rash    chives    Social History:   Social History   Socioeconomic History   Marital status: Single    Spouse name: Not on file   Number of children: Not on file   Years of education: Not on file   Highest education level: Not on file  Occupational History   Not on file  Tobacco Use   Smoking status: Every Day    Packs/day: 0.50    Years: 41.00    Total pack years: 20.50    Types: Cigarettes   Smokeless tobacco: Never   Tobacco comments:    started age 48  Vaping Use   Vaping Use: Never used  Substance and Sexual Activity   Alcohol use: Yes    Alcohol/week: 12.0 standard drinks of alcohol    Types: 12 Cans of beer per week    Comment: at least a 6 pack of beer daily and often a 12 pack   Drug use: Not Currently    Types: Cocaine, Marijuana   Sexual activity: Not on file  Other Topics Concern   Not on file  Social History Narrative   Single, lives in [place] with his mother.  He does not routinely exercise.  He sometimes works, helping to deliver appliances to homes.   Social Determinants of Health   Financial Resource Strain: Not on file  Food Insecurity: Food Insecurity Present (10/22/2022)   Hunger Vital Sign    Worried About Running Out of Food in the Last Year: Sometimes true    Ran Out of Food in the Last Year: Sometimes true  Transportation Needs: No Transportation Needs (10/22/2022)   PRAPARE - Hydrologist (Medical): No    Lack of Transportation (Non-Medical): No  Physical Activity: Not on file  Stress: Not on file  Social Connections: Not on file  Intimate  Partner Violence: Not At Risk (10/22/2022)   Humiliation, Afraid, Rape, and Kick questionnaire    Fear of Current or Ex-Partner: No  Emotionally Abused: No    Physically Abused: No    Sexually Abused: No    Family History:    Family History  Problem Relation Age of Onset   Coronary artery disease Mother        alive   Other Other        2 sisters alive and well   Alcohol abuse Father        died @ 65   Other Paternal Grandmother        PACER     ROS:  Please see the history of present illness.  Review of Systems  Constitutional:  Positive for malaise/fatigue.  Respiratory:  Positive for shortness of breath.   Cardiovascular:  Positive for chest pain.  Gastrointestinal:  Positive for abdominal pain.  Neurological:  Positive for weakness.    All other ROS reviewed and negative.     Physical Exam/Data:   Vitals:   10/29/22 0422 10/29/22 0759 10/29/22 1109 10/29/22 1144  BP: 113/70 126/79 (!) 170/93 (!) 151/96  Pulse: 81 86 (!) 139 (!) 108  Resp: 17 18  (!) 28  Temp: 98 F (36.7 C) 97.8 F (36.6 C)  97.8 F (36.6 C)  TempSrc: Oral Oral  Axillary  SpO2: 99% 98% 100% 100%  Weight:      Height:        Intake/Output Summary (Last 24 hours) at 10/29/2022 1257 Last data filed at 10/29/2022 1200 Gross per 24 hour  Intake 800 ml  Output 1775 ml  Net -975 ml      10/22/2022    2:52 AM 10/21/2022    7:52 PM 10/17/2022    8:51 AM  Last 3 Weights  Weight (lbs) 186 lb 4.6 oz 180 lb 6.4 oz 180 lb 3.2 oz  Weight (kg) 84.5 kg 81.829 kg 81.738 kg     Body mass index is 31 kg/m.  General:  Well nourished, well developed, in no acute distress, watching TV on BiPap without difficulty HEENT: normal Neck: no JVD Vascular: No carotid bruits; Distal pulses 2+ bilaterally Cardiac:  normal S1, S2; RRR; no murmur  Lungs: Diminished to auscultation bilaterally, no wheezing, rhonchi or rales, respirations are unlabored at rest on BiPAP at 80% Abd: soft, nontender, no  hepatomegaly  Ext: no edema Musculoskeletal:  No deformities, BUE and BLE strength normal and equal Skin: warm and dry  Neuro:  CNs 2-12 intact, no focal abnormalities noted Psych:  Normal affect   EKG:  The EKG was personally reviewed and demonstrates: Sinus tachycardia with rate of 122, nonspecific ST-T wave changes with a right axis deviation Telemetry:  Telemetry was personally reviewed and demonstrates: Sinus rhythm sinus tach noted on bedside monitor  Relevant CV Studies: Lexiscan MPI 10/03/2022   The study is low risk.   Findings are consistent with prior myocardial infarction. No ischemia.   No ST deviation was noted.   LV perfusion is abnormal. There is no evidence of ischemia. There is no evidence of infarction. Defect 1: There is a small defect with severe reduction in uptake present in the basal inferior location(s) that is fixed. There is abnormal wall motion in the defect area. Consistent with infarction.   Left ventricular function is abnormal. There was a single regional abnormality. Nuclear stress EF: 53 %. The left ventricular ejection fraction is mildly decreased (45-54%). End diastolic cavity size is normal. End systolic cavity size is normal.  TTE 10/01/2022 1. Left ventricular ejection fraction, by estimation, is 60 to 65%.  Left  ventricular ejection fraction by 2D MOD biplane is 64.0 %. The left  ventricle has normal function. The left ventricle has no regional wall  motion abnormalities. The left  ventricular internal cavity size was mildly dilated. Left ventricular  diastolic parameters are consistent with Grade I diastolic dysfunction  (impaired relaxation).   2. Right ventricular systolic function is normal. The right ventricular  size is normal.   3. Left atrial size was moderately dilated.   4. The mitral valve is grossly normal. Trivial mitral valve  regurgitation.   5. The aortic valve is tricuspid. Aortic valve regurgitation is not  visualized.    Laboratory Data:  High Sensitivity Troponin:   Recent Labs  Lab 10/01/22 0136 10/21/22 2000 10/21/22 2131 10/29/22 1051 10/29/22 1204  TROPONINIHS 64* '13 13 15 '$ 241*     Chemistry Recent Labs  Lab 10/23/22 0449 10/25/22 0339 10/26/22 0620 10/27/22 0534 10/29/22 0614  NA 139 138 139 139 139  K 3.1* 5.4* 4.4 4.1 4.7  CL 105 112* 112* 112* 110  CO2 '28 23 24 25 24  '$ GLUCOSE 118* 138* 142* 100* 146*  BUN <5* '9 12 10 11  '$ CREATININE 0.70 0.68 0.67 0.72 0.65  CALCIUM 8.6* 8.5* 8.5* 8.8* 8.2*  MG 1.9 2.2  --   --   --   GFRNONAA >60 >60 >60 >60 >60  ANIONGAP 6 3* 3* 2* 5    Recent Labs  Lab 10/23/22 0449  PROT 6.5  ALBUMIN 2.5*  AST 63*  ALT 17  ALKPHOS 233*  BILITOT 1.9*   Lipids No results for input(s): "CHOL", "TRIG", "HDL", "LABVLDL", "LDLCALC", "CHOLHDL" in the last 168 hours.  Hematology Recent Labs  Lab 10/25/22 0339 10/26/22 0620 10/27/22 0534 10/29/22 0614  WBC 9.5  --  5.5 6.3  RBC 2.82*  --  2.76* 2.79*  HGB 8.6* 8.1* 8.2* 8.3*  HCT 26.8*  --  26.1* 26.8*  MCV 95.0  --  94.6 96.1  MCH 30.5  --  29.7 29.7  MCHC 32.1  --  31.4 31.0  RDW 20.6*  --  20.8* 21.1*  PLT 150  --  133* 123*   Thyroid No results for input(s): "TSH", "FREET4" in the last 168 hours.  BNPNo results for input(s): "BNP", "PROBNP" in the last 168 hours.  DDimer No results for input(s): "DDIMER" in the last 168 hours.   Radiology/Studies:  DG Chest Port 1 View  Result Date: 10/29/2022 CLINICAL DATA:  Shortness of breath EXAM: PORTABLE CHEST 1 VIEW COMPARISON:  Chest x-ray dated October 21, 2022 FINDINGS: Unchanged cardiomegaly. New diffuse interstitial opacities. No large pleural effusion or evidence of pneumothorax. IMPRESSION: New diffuse interstitial opacities, compatible with pulmonary edema. Electronically Signed   By: Yetta Glassman M.D.   On: 10/29/2022 11:02   [place] Lower Ext Art Right Ltd  Result Date: 10/26/2022 CLINICAL DATA:  Groin pain 3 days post visceral  arteriogram EXAM: RIGHT LOWER EXTREMITY ARTERIAL DUPLEX SCAN TECHNIQUE: Gray-scale sonography as well as color Doppler and duplex ultrasound was performed to evaluate the right lower extremity arteries including the common, superficial and profunda femoral arteries, popliteal artery and calf arteries. COMPARISON:  CT 09/30/2022 FINDINGS: Normal waveforms in the common and superficial femoral arteries. Normal color Doppler signal and common, deep, and superficial femoral arteries. No evidence of pseudoaneurysm. Normal venous color signal and waveforms in the right common femoral vein, femoral vein, and GSV. No evidence of AV fistula. No regional hematoma. IMPRESSION: Negative. Electronically Signed  By: Lucrezia Europe M.D.   On: 10/26/2022 20:00     Assessment and Plan:   Acute on chronic diastolic congestive heart failure, history of ischemic cardiomyopathy with recovered EF -LVEF 60-65% 10/01/2022, no regional wall motion abnormalities, G1 DD, with no valvular abnormalities noted -Chest x-ray consistent with pulmonary edema -Received furosemide 60 mg IVP -Patient currently maintaining oxygen saturations 95 to 99% on BiPAP at 80% FiO2 -BNP added to previous labs - -850 mL of urine output noted since IV furosemide given -Primary team is ordered 40 mg of IV furosemide daily -Daily weight, I's and O's, low-sodium diet  Coronary artery disease of native coronary artery with chronic angina and elevated high-sensitivity troponin -Bare-metal stent placed to the LAD in 01/2011 -Previously had to be off aspirin therapy for hemorrhoidectomy -Aspirin 81 mg daily restarted today -No ischemic changes noted on EKG -High-sensitivity troponins trending 15 and 241 -If continued elevation of high-sensitivity troponin consider starting heparin drip without bolus -Primary team is already okay with heparin infusion with surgery -Continued on statin and beta-blocker therapies, is acceptable to use low-dose carvedilol  with recurrent history of cocaine abuse -Had previously been on diltiazem 30 mg twice daily for antianginal effect consider restarting -If continues with chest pain and elevated troponins we will consider repeat left heart catheterization  Acute respiratory distress -Currently maintaining oxygen saturation and decreased work of breathing on BiPAP -With patient being in the hospital for a week at this time consider ruling out PE -Continue with nebulizers -Supportive care -Management per primary team  Mesenteric artery stenosis -Recent SMA stenting by Dr. Lucky Cowboy -Endorses decreased abdominal pain -Continues to be followed by vascular  Polysubstance abuse -Continued on nicotine patch -Cessation is recommended for tobacco, alcohol, and cocaine use   Risk Assessment/Risk Scores:   For questions or updates, please contact [place] Please consult [email or web address] for contact info under    Signed, Deeric Cruise, NP  10/29/2022 12:57 PM

## 2022-10-29 NOTE — Progress Notes (Signed)
  Chaplain On-Call responded to Rapid Response notification at 1100 hours.  The medical team was attending to the patient at bedside.  No family members are present.  Patient was transferred to the ICU, bed 16.  Chaplains are available for additional support if requested.  Chaplain Pollyann Samples M.Div., [facility]

## 2022-10-29 NOTE — Progress Notes (Signed)
Progress Note   [name] MGQ:[id number] DOB: May 10, 1971 DOA: 10/21/2022     8 DOS: the patient was seen and examined on 10/29/2022   Brief hospital course: Ethan Wells is a 51 y.o. male with medical history significant of polysubstance abuse, coronary artery disease, anemia due to hemmoroids, QT prolongation chronic pain syndrome, who presents to the ER today on account of generalized body aches and pains.  At the ER he was found to have potassium of 2.2 subsequently started on IV potassium.  Patient is a poor historian. During my encounter patient was complaining of pain in his arm due to the IV potassium. He endorses recent use of cocaine and alcohol.   Labs concerning for hyponatremia sodium 133, potassium 2.2, magnesium 1.7, hemoglobin 8.8.   Chest x-ray shows no pneumonia, pneumothorax or pleural effusion.   EKG shows no acute ST-T wave changes, first 2 sets of high-sensitivity troponins were 13, 13.  Patient was started on a trial of Solu-Medrol to see if this helps out his pains.  ANA negative. Rheumatoid factor slightly elevated.  Dr. Lucky Cowboy did a angiogram and placed a stent in the superior mesenteric artery.  Patient's epigastric pain improved after this.  Patient on aspirin.  Patient complaining of rectal bleeding.  Reviewed recent colonoscopy and flexible sigmoidoscopy and the patient does have some bleeding hemorrhoids.  Anusol ordered.  Dr. Hampton Abbot did a hemorrhoidal procedure on 10/28/2022.  On 10/29/2022, the patient had some chest pain and then developed shortness of breath.  EKGs were done which showed nonspecific changes.  Troponins pending.  Chest x-ray looks like pulmonary vascular congestion and 60 mg of Lasix was given.  The patient was started on BiPAP and transferred to the stepdown unit.  We will give another dose of IV Lasix this evening.  Reviewed recent echocardiogram with an EF of 60%.  Case discussed with Dr. Saunders Revel cardiology.  Aspirin  restarted.  Assessment and Plan: * Acute on chronic diastolic CHF (congestive heart failure) ([place]) Patient with some chest pain and shortness of breath today.  EKG shows nonspecific ST-T wave changes.  Chest x-ray consistent with heart failure.  Patient given 60 mg of IV Lasix will repeat in the afternoon.  Troponins pending.  Cardiology consultation.  Restarted aspirin.  Recent echocardiogram shows an EF of 60%.  Acute respiratory distress Secondary to heart failure.  BiPAP support for right now until we remove fluid.  Hemorrhoids, internal, with bleeding Dr. Hampton Abbot took to the operating room yesterday for hemorrhoid procedure.  He recommended holding aspirin for another day prior to restarting but with today's events needed to restart aspirin.  Mesenteric artery stenosis (HCC) Mesenteric ischemia.  Dr. Lucky Cowboy placed a stent in the SMA on 10/24/2022.  Restarted aspirin.   Arterial ultrasound negative for pseudoaneurysm.  Epigastric pain Seems to improved after placing a stent in the SMA.  Also reviewed endoscopy from 05/30/2022 showing esophagitis and portal hypertensive gastropathy and duodenitis.  We will continue empiric PPI.  Chest pain Cardiac enzymes negative on admission.  Patient had a recent stress test that was a low risk scan.  Test was consistent with prior myocardial infarction.  No ischemia seen.  Repeat chest pain today repeat cardiac enzymes ordered.  CAD (coronary artery disease) On Crestor, restarted aspirin.  Hyperkalemia Improved  Elevated rheumatoid factor Will refer to rheumatology as outpatient.  Patient not the best candidate for methotrexate with history of drinking.  We will continue low-dose steroids for now.  Iron deficiency  anemia Last hemoglobin 8.3.  IV iron was given in the hospital 10/25/2022 and 10/27/22.  Prolonged QT interval Monitoring on telemetry  Alcohol dependence ([place]) No signs of withdrawal.  Continue folate thiamine.         Subjective: Patient complained of some chest discomfort and shortness of breath and worsening breathing.  Had hemorrhoid procedure last night.  Physical Exam: Vitals:   10/28/22 2036 10/29/22 0422 10/29/22 0759 10/29/22 1109  BP: 129/77 113/70 126/79 (!) 170/93  Pulse: 98 81 86 (!) 139  Resp: '16 17 18   '$ Temp: 97.8 F (36.6 C) 98 F (36.7 C) 97.8 F (36.6 C)   TempSrc: Oral Oral Oral   SpO2: 100% 99% 98% 100%  Weight:      Height:       Physical Exam HENT:     Head: Normocephalic.     Mouth/Throat:     Pharynx: No oropharyngeal exudate.  Eyes:     General: Lids are normal.     Conjunctiva/sclera: Conjunctivae normal.  Cardiovascular:     Rate and Rhythm: Regular rhythm. Tachycardia present.     Heart sounds: Normal heart sounds, S1 normal and S2 normal.  Pulmonary:     Breath sounds: Examination of the right-middle field reveals decreased breath sounds and rales. Examination of the left-middle field reveals decreased breath sounds and rales. Examination of the right-lower field reveals decreased breath sounds and rales. Examination of the left-lower field reveals decreased breath sounds and rales. Decreased breath sounds and rales present.  Abdominal:     Palpations: Abdomen is soft.     Tenderness: There is no abdominal tenderness.  Musculoskeletal:     Right lower leg: No swelling.     Left lower leg: No swelling.  Skin:    General: Skin is warm.     Comments: Bruising around abdomen both sides.  Neurological:     Mental Status: He is alert and oriented to person, place, and time.     Data Reviewed: Creatinine 0.65, potassium 4.7, hemoglobin 8.3, platelet count 123   Disposition: Status is: Inpatient Remains inpatient appropriate because: Transferred to stepdown unit secondary to CHF and fluid overload.  BiPAP for right now.  Patient critically ill.  Planned Discharge Destination: Home    Time spent: 35 minutes  Author: Loletha Grayer, MD 10/29/2022  11:37 AM  For on call review [email or web address].

## 2022-10-29 NOTE — Progress Notes (Signed)
[place] Hospital Day(s): 8.   Post op day(s): 1 Day Post-Op.   Interval History:  Patient seen and examined No acute events or new complaints overnight.  Patient reports he is doing better; desires to have a movement, feels a real food diet would be helpful Some pain, fever, chills   Vital signs in last 24 hours: [min-max] current  Temp:  [97.4 F (36.3 C)-98 F (36.7 C)] 97.8 F (36.6 C) (11/14 0759) Pulse Rate:  [81-112] 86 (11/14 0759) Resp:  [16-20] 18 (11/14 0759) BP: (113-153)/(67-95) 126/79 (11/14 0759) SpO2:  [93 %-100 %] 98 % (11/14 0759)     Height: '5\' 5"'$  (165.1 cm) Weight: 84.5 kg BMI (Calculated): 31   Intake/Output last 2 shifts:  11/13 0701 - 11/14 0700 In: 800 [I.V.:700; IV Piggyback:100] Out: 925 [Urine:900; Blood:25]   Physical Exam:  Constitutional: alert, cooperative and no distress  Respiratory: breathing non-labored at rest  Cardiovascular: regular rate and sinus rhythm  No unexpected findings on clinical exam. Labs:     Latest Ref Rng & Units 10/29/2022    6:14 AM 10/27/2022    5:34 AM 10/26/2022    6:20 AM  CBC  WBC 4.0 - 10.5 K/uL 6.3  5.5    Hemoglobin 13.0 - 17.0 g/dL 8.3  8.2  8.1   Hematocrit 39.0 - 52.0 % 26.8  26.1    Platelets 150 - 400 K/uL 123  133        Latest Ref Rng & Units 10/29/2022    6:14 AM 10/27/2022    5:34 AM 10/26/2022    6:20 AM  CMP  Glucose 70 - 99 mg/dL 146  100  142   BUN 6 - 20 mg/dL '11  10  12   '$ Creatinine 0.61 - 1.24 mg/dL 0.65  0.72  0.67   Sodium 135 - 145 mmol/L 139  139  139   Potassium 3.5 - 5.1 mmol/L 4.7  4.1  4.4   Chloride 98 - 111 mmol/L 110  112  112   CO2 22 - 32 mmol/L '24  25  24   '$ Calcium 8.9 - 10.3 mg/dL 8.2  8.8  8.5      Imaging studies: No new pertinent imaging studies   Assessment/Plan:  51 y.o. male with bleeding internal hemorrhoids   -We discussed the opportunity to do sitz bath's at home, but he explained he has a social  situation which might prevent him from taking advantage of this currently.  - Pain control prn - Monitor rectal bleeding -Suspect he will do better in an outpatient setting, with sitz bath's.  I do not believe we are adding much to his care while keeping him in the hospital.  May proceed with discharge as able.  Follow-up with Dr. Hampton Abbot in 2 weeks.  All of the above findings and recommendations were discussed with the patient, and the medical team, and all of patient's questions were answered to his expressed satisfaction.  Ronny Bacon, M.D., [facility]  10/29/2022 ; 8:43 AM

## 2022-10-29 NOTE — Significant Event (Signed)
Rapid Response Event Note   Reason for Call :  Respiratory distress  Initial Focused Assessment:  Rapid response RN arrived in patient's room with patient sitting up in bed in an almost tripod position, with elevated work of breathing and dusky color of lips and face. Crackles audible without stethoscope upon entry into room. 2C staff at bedside was in the process of placing non rebreather mask at 15L on patient. Dr. Leslye Peer arrived shortly after rapid response RN and had already ordered a chest xray (done but not read at the time of rapid response) and IV steroids. Initial vital signs on non rebreather mask showed BP 170/93, HR 139 appeared ST, RR upper 30s, oxygen saturations were 100% on non rebreather and patient's color was starting to improve.  Interventions:  Patient received 60 mg of IV lasix per orders from Dr. Leslye Peer. RT placed patient on Bipap. Once on bipap patient had easier work of breathing. Lungs still with crackles upon auscultation after bipap application.  Plan of Care:  Patient transferred to ICU 16 by this RN and RT. Patient stayed on bipap for transfer. Magda Paganini RN received report from [employer]. This RN updated patient's mom about transfer per patient request. Vital signs before transfer at 11:22 were BP 132/82, RR 24 only slightly labored, HR 107 SR, and oxygen saturation 100% on bipap at 80% FiO2.  Event Summary:   MD Notified: Dr. Leslye Peer Call Time: 11:02 Arrival Time: 11:04 End Time: 11:44  Ethan Wells, Jaynie Bream, RN

## 2022-10-29 NOTE — Assessment & Plan Note (Addendum)
Patient with some chest pain and shortness of breath today.  EKG shows nonspecific ST-T wave changes.  Chest x-ray consistent with heart failure.  Patient given 60 mg of IV Lasix will repeat in the afternoon.  Troponins elevated, BNP at 1049.  Recent echocardiogram with normal EF and no regional wall motion abnormalities. - Cardiology was  consultation.  Restarted aspirin.   -Continue with IV Lasix 40 mg daily -Daily weight and BMP -Strict intake and output

## 2022-10-29 NOTE — Progress Notes (Signed)
Lung sounds improved from this morning.  Hopefully be able to come off BiPAP shortly

## 2022-10-29 NOTE — Plan of Care (Signed)

## 2022-10-29 NOTE — Progress Notes (Signed)
ANTICOAGULATION CONSULT NOTE  Pharmacy Consult for heparin infusion initiation and monitoring Indication: chest pain/ACS  Allergies  Allergen Reactions   Penicillins Hives    Tolerated Ceftriaxone 10/2022   Other Rash    chives    Patient Measurements: Height: '5\' 5"'$  (165.1 cm) Weight: 84.5 kg (186 lb 4.6 oz) IBW/kg (Calculated) : 61.5 Heparin Dosing Weight: 79 kg  Vital Signs: Temp: 97.8 F (36.6 C) (11/14 1144) Temp Source: Axillary (11/14 1144) BP: 121/70 (11/14 1600) Pulse Rate: 103 (11/14 1600)  Labs: Recent Labs    10/27/22 0534 10/29/22 0614 10/29/22 1051 10/29/22 1204 10/29/22 1433  HGB 8.2* 8.3*  --   --   --   HCT 26.1* 26.8*  --   --   --   PLT 133* 123*  --   --   --   CREATININE 0.72 0.65  --   --   --   TROPONINIHS  --   --  15 241* 1,607*     Estimated Creatinine Clearance: 110.5 mL/min (by C-G formula based on SCr of 0.65 mg/dL).   Medical History: Past Medical History:  Diagnosis Date   CAD (coronary artery disease)    a. 01/2011 Anterior STEMI/Cath/PCI: LM nl, LAD 100d (3.5x51m Vision BMS placed), LCX 251mRI 50, RCA min irregs, EF 40% w/ apical, inferoapical HK.   Cardiac arrest - ventricular fibrillation    a. In setting of STEMI 01/2011   Chronic pain    [place]   Depression with anxiety    ETOH abuse    a. 6-12 beers/day   GERD (gastroesophageal reflux disease)    Hemorrhoids    Hyperlipidemia    Hypertension    Ischemic cardiomyopathy    a. 06/2011 Echo: EF 45-50%, No rwma   Marijuana abuse    Migraines    Tobacco abuse    a. 1/2 ppd x 26 yrs    Medications:  Scheduled:   aspirin EC  81 mg Oral Daily   calcium-vitamin D  1 tablet Oral BID   Chlorhexidine Gluconate Cloth  6 each Topical Daily   folic acid  1 mg Oral Daily   furosemide  40 mg Intravenous Once   [START ON 10/30/2022] furosemide  40 mg Intravenous Daily   gabapentin  300 mg Oral TID   hydrocortisone   Rectal BID   ipratropium-albuterol  3 mL  Nebulization Q6H   ketorolac  30 mg Intravenous Q6H   magnesium oxide  400 mg Oral BID   metoprolol tartrate  12.5 mg Oral BID   multivitamin with minerals  1 tablet Oral Daily   nicotine  14 mg Transdermal Daily   pantoprazole  40 mg Oral BID   [START ON 10/30/2022] predniSONE  20 mg Oral Q breakfast   psyllium  1 packet Oral Daily   rosuvastatin  40 mg Oral Daily   thiamine  100 mg Oral Daily    Assessment: [age].o. male w/ PMH of PSA, CAD, anemia with bleeding internal hemorrhoids and now  second elevated cardiac enzyme. A review of medical records reveals no chronic anticoagulation prior to arrival.   Goal of Therapy:  Heparin level 0.3-0.5 units/ml Monitor platelets by anticoagulation protocol: Yes   Plan:  Restart heparin infusion at 1100 units/hr (no bolus per MD order) Check anti-Xa level in 6 hours and daily while on heparin Continue to monitor H&H and platelets  AnDarrick Penna1/14/2023,4:37 PM

## 2022-10-29 NOTE — Progress Notes (Signed)
ANTICOAGULATION CONSULT NOTE  Pharmacy Consult for heparin infusion initiation and monitoring Indication: chest pain/ACS  Allergies  Allergen Reactions   Penicillins Hives    Tolerated Ceftriaxone 10/2022   Other Rash    chives    Patient Measurements: Height: '5\' 5"'$  (165.1 cm) Weight: 84.5 kg (186 lb 4.6 oz) IBW/kg (Calculated) : 61.5 Heparin Dosing Weight: 79 kg  Vital Signs: Temp: 98.3 F (36.8 C) (11/14 2000) Temp Source: Oral (11/14 2000) BP: 109/62 (11/14 2300) Pulse Rate: 92 (11/14 2300)  Labs: Recent Labs    10/27/22 0534 10/29/22 0614 10/29/22 1051 10/29/22 1204 10/29/22 1433 10/29/22 1636 10/29/22 2236  HGB 8.2* 8.3*  --   --   --   --   --   HCT 26.1* 26.8*  --   --   --   --   --   PLT 133* 123*  --   --   --   --   --   HEPARINUNFRC  --   --   --   --   --   --  0.20*  CREATININE 0.72 0.65  --   --   --   --   --   TROPONINIHS  --   --    < > 241* 1,607* 1,418*  --    < > = values in this interval not displayed.     Estimated Creatinine Clearance: 110.5 mL/min (by C-G formula based on SCr of 0.65 mg/dL).   Medical History: Past Medical History:  Diagnosis Date   CAD (coronary artery disease)    a. 01/2011 Anterior STEMI/Cath/PCI: LM nl, LAD 100d (3.5x12m Vision BMS placed), LCX 253mRI 50, RCA min irregs, EF 40% w/ apical, inferoapical HK.   Cardiac arrest - ventricular fibrillation    a. In setting of STEMI 01/2011   Chronic pain    Cocaine abuse (HCC)    Depression with anxiety    ETOH abuse    a. 6-12 beers/day   GERD (gastroesophageal reflux disease)    Hemorrhoids    Hyperlipidemia    Hypertension    Ischemic cardiomyopathy    a. 06/2011 Echo: EF 45-50%, No rwma   Marijuana abuse    Migraines    Tobacco abuse    a. 1/2 ppd x 26 yrs    Medications:  Scheduled:   aspirin EC  81 mg Oral Daily   calcium-vitamin D  1 tablet Oral BID   Chlorhexidine Gluconate Cloth  6 each Topical Daily   folic acid  1 mg Oral Daily   [START ON  10/30/2022] furosemide  40 mg Intravenous Daily   gabapentin  300 mg Oral TID   hydrocortisone   Rectal BID   ipratropium  0.5 mg Nebulization TID   ketorolac  30 mg Intravenous Q6H   magnesium oxide  400 mg Oral BID   metoprolol tartrate  12.5 mg Oral BID   multivitamin with minerals  1 tablet Oral Daily   nicotine  14 mg Transdermal Daily   pantoprazole  40 mg Oral BID   [START ON 10/30/2022] predniSONE  20 mg Oral Q breakfast   psyllium  1 packet Oral Daily   rosuvastatin  40 mg Oral Daily   thiamine  100 mg Oral Daily    Assessment: [age].o. male w/ PMH of PSA, CAD, anemia with bleeding internal hemorrhoids and now  second elevated cardiac enzyme. A review of medical records reveals no chronic anticoagulation prior to arrival.   Goal  of Therapy:  Heparin level 0.3-0.5 units/ml Monitor platelets by anticoagulation protocol: Yes   11/14 2236 HL 0.2, subtherapeutic  Plan:  No Bolus per MD order Increase heparin infusion to 1250 units/hr Recheck HL w/ AM labs Continue to monitor H&H and platelets  Renda Rolls, PharmD, [facility] 10/29/2022 11:42 PM

## 2022-10-29 NOTE — Progress Notes (Signed)
Responded to Rapid Response page. On arrival RRT present, pt's RN present, Dr. Leslye Peer present. Pt with supplemenatal O2 sitting in a tripod position, appears SOB, increased WOB. Pt being given 40 mg lasix IV push, then followed with an additional '20mg'$  lasix. Pt placed on bi-pap by respiratory therapy. ICU CN, RT remain present in room to assess how patient is responding to Bipap.

## 2022-10-29 NOTE — Progress Notes (Signed)
ANTICOAGULATION CONSULT NOTE  Pharmacy Consult for heparin infusion initiation and monitoring Indication: chest pain/ACS  Allergies  Allergen Reactions   Penicillins Hives    Tolerated Ceftriaxone 10/2022   Other Rash    chives    Patient Measurements: Height: '5\' 5"'$  (165.1 cm) Weight: 84.5 kg (186 lb 4.6 oz) IBW/kg (Calculated) : 61.5 Heparin Dosing Weight: 79 kg  Vital Signs: Temp: 97.8 F (36.6 C) (11/14 1144) Temp Source: Axillary (11/14 1144) BP: 151/96 (11/14 1144) Pulse Rate: 108 (11/14 1144)  Labs: Recent Labs    10/27/22 0534 10/29/22 0614 10/29/22 1051 10/29/22 1204  HGB 8.2* 8.3*  --   --   HCT 26.1* 26.8*  --   --   PLT 133* 123*  --   --   CREATININE 0.72 0.65  --   --   TROPONINIHS  --   --  15 241*    Estimated Creatinine Clearance: 110.5 mL/min (by C-G formula based on SCr of 0.65 mg/dL).   Medical History: Past Medical History:  Diagnosis Date   CAD (coronary artery disease)    a. 01/2011 Anterior STEMI/Cath/PCI: LM nl, LAD 100d (3.5x47m Vision BMS placed), LCX 27mRI 50, RCA min irregs, EF 40% w/ apical, inferoapical HK.   Cardiac arrest - ventricular fibrillation    a. In setting of STEMI 01/2011   Chronic pain    [place]   Depression with anxiety    ETOH abuse    a. 6-12 beers/day   GERD (gastroesophageal reflux disease)    Hemorrhoids    Hyperlipidemia    Hypertension    Ischemic cardiomyopathy    a. 06/2011 Echo: EF 45-50%, No rwma   Marijuana abuse    Migraines    Tobacco abuse    a. 1/2 ppd x 26 yrs    Medications:  Scheduled:   aspirin EC  81 mg Oral Daily   calcium-vitamin D  1 tablet Oral BID   Chlorhexidine Gluconate Cloth  6 each Topical Daily   folic acid  1 mg Oral Daily   furosemide  40 mg Intravenous Once   [START ON 10/30/2022] furosemide  40 mg Intravenous Daily   gabapentin  300 mg Oral TID   hydrocortisone   Rectal BID   ipratropium-albuterol  3 mL Nebulization Q6H   ketorolac  30 mg  Intravenous Q6H   magnesium oxide  400 mg Oral BID   metoprolol tartrate  12.5 mg Oral BID   multivitamin with minerals  1 tablet Oral Daily   nicotine  14 mg Transdermal Daily   pantoprazole  40 mg Oral BID   [START ON 10/30/2022] predniSONE  20 mg Oral Q breakfast   psyllium  1 packet Oral Daily   rosuvastatin  40 mg Oral Daily   thiamine  100 mg Oral Daily    Assessment: [age].o. male w/ PMH of PSA, CAD, anemia with bleeding internal hemorrhoids and now  second elevated cardiac enzyme. A review of medical records reveals no chronic anticoagulation prior to arrival.   Goal of Therapy:  Heparin level 0.3-0.5 units/ml Monitor platelets by anticoagulation protocol: Yes   Plan:  Start heparin infusion at 1100 units/hr (no bolus per MD order) Check anti-Xa level in 6 hours and daily while on heparin Continue to monitor H&H and platelets  RoDallie Piles1/14/2023,12:57 PM

## 2022-10-30 ENCOUNTER — Inpatient Hospital Stay (HOSPITAL_COMMUNITY)
Admit: 2022-10-30 | Discharge: 2022-10-30 | Disposition: A | Discharge status: Home / Self Care | Payer: Medicare HMO | Attending: Internal Medicine | Admitting: Internal Medicine

## 2022-10-30 DIAGNOSIS — I2489 Other forms of acute ischemic heart disease: Secondary | ICD-10-CM | POA: Diagnosis not present

## 2022-10-30 DIAGNOSIS — I214 Non-ST elevation (NSTEMI) myocardial infarction: Secondary | ICD-10-CM

## 2022-10-30 LAB — BASIC METABOLIC PANEL
Anion gap: 7 (ref 5–15)
BUN: 18 mg/dL (ref 6–20)
CO2: 26 mmol/L (ref 22–32)
Calcium: 8.8 mg/dL — ABNORMAL LOW (ref 8.9–10.3)
Chloride: 105 mmol/L (ref 98–111)
Creatinine, Ser: 0.94 mg/dL (ref 0.61–1.24)
GFR, Estimated: >60 mL/min (ref >60)
Glucose, Bld: 131 mg/dL — ABNORMAL HIGH (ref 70–99)
Potassium: 4.6 mmol/L (ref 3.5–5.1)
Sodium: 138 mmol/L (ref 135–145)

## 2022-10-30 LAB — HEPARIN LEVEL (UNFRACTIONATED)
Heparin Unfractionated: 0.27 IU/mL — ABNORMAL LOW (ref 0.30–0.70)
Heparin Unfractionated: 0.38 IU/mL (ref 0.30–0.70)
Heparin Unfractionated: 0.4 IU/mL (ref 0.30–0.70)

## 2022-10-30 LAB — CBC
HCT: 26.2 % — ABNORMAL LOW (ref 39.0–52.0)
Hemoglobin: 8.4 g/dL — ABNORMAL LOW (ref 13.0–17.0)
MCH: 30.8 pg (ref 26.0–34.0)
MCHC: 32.1 g/dL (ref 30.0–36.0)
MCV: 96 fL (ref 80.0–100.0)
Platelets: 119 10*3/uL — ABNORMAL LOW (ref 150–400)
RBC: 2.73 MIL/uL — ABNORMAL LOW (ref 4.22–5.81)
RDW: 22.6 % — ABNORMAL HIGH (ref 11.5–15.5)
WBC: 10.5 10*3/uL (ref 4.0–10.5)
nRBC: 0 % (ref 0.0–0.2)

## 2022-10-30 LAB — ECHOCARDIOGRAM LIMITED
Height: 65 in
S' Lateral: 3.4 cm
Weight: 2980.62 oz

## 2022-10-30 LAB — SURGICAL PATHOLOGY

## 2022-10-30 LAB — MRSA NEXT GEN BY PCR, NASAL: MRSA by PCR Next Gen: NOT DETECTED

## 2022-10-30 LAB — PROCALCITONIN: Procalcitonin: <0.1 ng/mL

## 2022-10-30 MED ORDER — PERFLUTREN LIPID MICROSPHERE
1.0000 mL | INTRAVENOUS | Status: AC | PRN
  Administered 2022-10-30: 3 mL via INTRAVENOUS
  Filled 2022-10-30: qty 10

## 2022-10-30 NOTE — Progress Notes (Signed)
ANTICOAGULATION CONSULT NOTE  Pharmacy Consult for heparin infusion initiation and monitoring Indication: chest pain/ACS  Allergies  Allergen Reactions   Penicillins Hives    Tolerated Ceftriaxone 10/2022   Other Rash    chives    Patient Measurements: Height: '5\' 5"'$  (165.1 cm) Weight: 84.5 kg (186 lb 4.6 oz) IBW/kg (Calculated) : 61.5 Heparin Dosing Weight: 79 kg  Vital Signs: Temp: 97.9 F (36.6 C) (11/15 0002) Temp Source: Oral (11/14 2000) BP: 122/73 (11/15 0400) Pulse Rate: 94 (11/15 0500)  Labs: Recent Labs    10/29/22 0614 10/29/22 1051 10/29/22 1204 10/29/22 1433 10/29/22 1636 10/29/22 2236 10/30/22 0543  HGB 8.3*  --   --   --   --   --  8.4*  HCT 26.8*  --   --   --   --   --  26.2*  PLT 123*  --   --   --   --   --  119*  HEPARINUNFRC  --   --   --   --   --  0.20* 0.27*  CREATININE 0.65  --   --   --   --   --  0.94  TROPONINIHS  --    < > 241* 1,607* 1,418*  --   --    < > = values in this interval not displayed.     Estimated Creatinine Clearance: 94 mL/min (by C-G formula based on SCr of 0.94 mg/dL).   Medical History: Past Medical History:  Diagnosis Date   CAD (coronary artery disease)    a. 01/2011 Anterior STEMI/Cath/PCI: LM nl, LAD 100d (3.5x48m Vision BMS placed), LCX 258mRI 50, RCA min irregs, EF 40% w/ apical, inferoapical HK.   Cardiac arrest - ventricular fibrillation    a. In setting of STEMI 01/2011   Chronic pain    [place]   Depression with anxiety    ETOH abuse    a. 6-12 beers/day   GERD (gastroesophageal reflux disease)    Hemorrhoids    Hyperlipidemia    Hypertension    Ischemic cardiomyopathy    a. 06/2011 Echo: EF 45-50%, No rwma   Marijuana abuse    Migraines    Tobacco abuse    a. 1/2 ppd x 26 yrs    Medications:  Scheduled:   aspirin EC  81 mg Oral Daily   calcium-vitamin D  1 tablet Oral BID   Chlorhexidine Gluconate Cloth  6 each Topical Daily   folic acid  1 mg Oral Daily   furosemide   40 mg Intravenous Daily   gabapentin  300 mg Oral TID   hydrocortisone   Rectal BID   ipratropium  0.5 mg Nebulization TID   ketorolac  30 mg Intravenous Q6H   magnesium oxide  400 mg Oral BID   metoprolol tartrate  12.5 mg Oral BID   multivitamin with minerals  1 tablet Oral Daily   nicotine  14 mg Transdermal Daily   pantoprazole  40 mg Oral BID   predniSONE  20 mg Oral Q breakfast   psyllium  1 packet Oral Daily   rosuvastatin  40 mg Oral Daily   thiamine  100 mg Oral Daily    Assessment: [age].o. male w/ PMH of PSA, CAD, anemia with bleeding internal hemorrhoids and now  second elevated cardiac enzyme. A review of medical records reveals no chronic anticoagulation prior to arrival.   Goal of Therapy:  Heparin level 0.3-0.5 units/ml Monitor  platelets by anticoagulation protocol: Yes   11/14 2236 HL 0.2, subtherapeutic 11/15 0543 HL 0.27, subtherapeutic  Plan:  No Bolus per MD order Increase heparin infusion to 1400 units/hr Recheck HL in 6 hrs after rate change Continue to monitor H&H and platelets  Renda Rolls, PharmD, [facility] 10/30/2022 6:28 AM

## 2022-10-30 NOTE — Progress Notes (Signed)
*  PRELIMINARY RESULTS* Echocardiogram 2D Echocardiogram has been performed.  Ethan Wells Ethan Wells Ethan Wells Ethan Wells 10/30/2022, 9:18 AM

## 2022-10-30 NOTE — Progress Notes (Signed)
ANTICOAGULATION CONSULT NOTE  Pharmacy Consult for heparin infusion initiation and monitoring Indication: chest pain/ACS  Allergies  Allergen Reactions   Penicillins Hives    Tolerated Ceftriaxone 10/2022   Other Rash    chives    Patient Measurements: Height: '5\' 5"'$  (165.1 cm) Weight: 84.5 kg (186 lb 4.6 oz) IBW/kg (Calculated) : 61.5 Heparin Dosing Weight: 79 kg  Vital Signs: Temp: 97.9 F (36.6 C) (11/15 0002) Temp Source: Oral (11/14 2000) BP: 122/73 (11/15 0400) Pulse Rate: 94 (11/15 0500)  Labs: Recent Labs    10/29/22 0614 10/29/22 1051 10/29/22 1204 10/29/22 1433 10/29/22 1636 10/29/22 2236 10/30/22 0543  HGB 8.3*  --   --   --   --   --  8.4*  HCT 26.8*  --   --   --   --   --  26.2*  PLT 123*  --   --   --   --   --  119*  HEPARINUNFRC  --   --   --   --   --  0.20* 0.27*  CREATININE 0.65  --   --   --   --   --  0.94  TROPONINIHS  --    < > 241* 1,607* 1,418*  --   --    < > = values in this interval not displayed.     Estimated Creatinine Clearance: 94 mL/min (by C-G formula based on SCr of 0.94 mg/dL).   Medical History: Past Medical History:  Diagnosis Date   CAD (coronary artery disease)    a. 01/2011 Anterior STEMI/Cath/PCI: LM nl, LAD 100d (3.5x73m Vision BMS placed), LCX 255mRI 50, RCA min irregs, EF 40% w/ apical, inferoapical HK.   Cardiac arrest - ventricular fibrillation    a. In setting of STEMI 01/2011   Chronic pain    [place]   Depression with anxiety    ETOH abuse    a. 6-12 beers/day   GERD (gastroesophageal reflux disease)    Hemorrhoids    Hyperlipidemia    Hypertension    Ischemic cardiomyopathy    a. 06/2011 Echo: EF 45-50%, No rwma   Marijuana abuse    Migraines    Tobacco abuse    a. 1/2 ppd x 26 yrs    Medications:  Scheduled:   aspirin EC  81 mg Oral Daily   calcium-vitamin D  1 tablet Oral BID   Chlorhexidine Gluconate Cloth  6 each Topical Daily   folic acid  1 mg Oral Daily   furosemide   40 mg Intravenous Daily   gabapentin  300 mg Oral TID   hydrocortisone   Rectal BID   ipratropium  0.5 mg Nebulization TID   ketorolac  30 mg Intravenous Q6H   magnesium oxide  400 mg Oral BID   metoprolol tartrate  12.5 mg Oral BID   multivitamin with minerals  1 tablet Oral Daily   nicotine  14 mg Transdermal Daily   pantoprazole  40 mg Oral BID   predniSONE  20 mg Oral Q breakfast   psyllium  1 packet Oral Daily   rosuvastatin  40 mg Oral Daily   thiamine  100 mg Oral Daily    Assessment: [age].o. male w/ PMH of PSA, CAD, anemia with bleeding internal hemorrhoids and now  second elevated cardiac enzyme. A review of medical records reveals no chronic anticoagulation prior to arrival.   Goal of Therapy:  Heparin level 0.3-0.5 units/ml Monitor  platelets by anticoagulation protocol: Yes  Plan: heparin level therapeutic continue heparin infusion at 1400 units/hr Recheck heparin level in 6 hours to confirm Continue to monitor H&H and platelets  Vallery Sa, PharmD, BCPS 10/30/2022 7:20 AM

## 2022-10-30 NOTE — Progress Notes (Signed)
[facility] Day(s): 9.   Post op day(s): 2 Days Post-Op.   Interval History:  Patient seen and examined No acute events or new complaints overnight.  Patient reports he continues to have pain with bowel movements expectedly Some blood mixed with stool He is on heparin for possible NSTEMI Hgb is stable; 8.4 BMP is reassuring He is on regular diet   Vital signs in last 24 hours: [min-max] current  Temp:  [97.8 F (36.6 C)-98.3 F (36.8 C)] 98 F (36.7 C) (11/15 0800) Pulse Rate:  [83-139] 83 (11/15 0800) Resp:  [13-28] 14 (11/15 0800) BP: (86-170)/(48-97) 112/64 (11/15 0800) SpO2:  [96 %-100 %] 100 % (11/15 0800)     Height: '5\' 5"'$  (165.1 cm) Weight: 84.5 kg BMI (Calculated): 31   Intake/Output last 2 shifts:  11/14 0701 - 11/15 0700 In: 140.3 [I.V.:140.3] Out: 2350 [Urine:2350]   Physical Exam:  Constitutional: alert, cooperative and no distress  Respiratory: breathing non-labored at rest  Cardiovascular: regular rate and sinus rhythm  Genitourinary: Chaperone present, scant amount of old blood present near rectum, no active bleeding  Labs:     Latest Ref Rng & Units 10/30/2022    5:43 AM 10/29/2022    6:14 AM 10/27/2022    5:34 AM  CBC  WBC 4.0 - 10.5 K/uL 10.5  6.3  5.5   Hemoglobin 13.0 - 17.0 g/dL 8.4  8.3  8.2   Hematocrit 39.0 - 52.0 % 26.2  26.8  26.1   Platelets 150 - 400 K/uL 119  123  133       Latest Ref Rng & Units 10/30/2022    5:43 AM 10/29/2022    6:14 AM 10/27/2022    5:34 AM  CMP  Glucose 70 - 99 mg/dL 131  146  100   BUN 6 - 20 mg/dL '18  11  10   '$ Creatinine 0.61 - 1.24 mg/dL 0.94  0.65  0.72   Sodium 135 - 145 mmol/L 138  139  139   Potassium 3.5 - 5.1 mmol/L 4.6  4.7  4.1   Chloride 98 - 111 mmol/L 105  110  112   CO2 22 - 32 mmol/L '26  24  25   '$ Calcium 8.9 - 10.3 mg/dL 8.8  8.2  8.8      Imaging studies: No new pertinent imaging studies   Assessment/Plan:  51 y.o. male 2 Days  Post-Op s/p hemorrhoidectomy bleeding internal hemorrhoids and enlarged external hemorrhoids, complicated by possible NSTEMI now requiring heparin drip   - Anticipate some degree of blood in stool/with bowel movements given procedure and need for heparin. No signs of active bleeding at this time.   - Pain control prn   - Good hygiene following bowel movements   - Further management per primary service  - Nothing further from surgery perspective. We will remain available should anything change. Follow up with Dr Kirke Corin in ~2 weeks once medically stable to discharge.    All of the above findings and recommendations were discussed with the patient, and the medical team, and all of patient's questions were answered to his expressed satisfaction.  -- Edison Simon, PA-C [facility] 10/30/2022, 9:26 AM M-F: 7am - 4pm

## 2022-10-30 NOTE — Progress Notes (Addendum)
       CROSS COVER NOTE  NAME: Ethan Wells MRN: [id number] DOB : [birth year] ATTENDING PHYSICIAN: Arnetha Courser, MD    Date of Service   10/30/2022   HPI/Events of Note   Notified of intermittent and transient soft BP that is correlating with morphine administration. BP 79/50 after morphine administration tonight.   Interventions   Assessment/Plan:  Reduced Morphine dose -->0.5 mg q3H PRN Continue Oxycodone and Dilaudid PRN     This document was prepared using Dragon voice recognition software and may include unintentional dictation errors.  Bishop Limbo DNP, MBA, FNP-BC Nurse Practitioner [facility] Pager [phone]

## 2022-10-30 NOTE — Progress Notes (Addendum)
ANTICOAGULATION CONSULT NOTE  Pharmacy Consult for heparin infusion initiation and monitoring Indication: chest pain/ACS  Allergies  Allergen Reactions   Penicillins Hives    Tolerated Ceftriaxone 10/2022   Other Rash    chives    Patient Measurements: Height: '5\' 5"'$  (165.1 cm) Weight: 84.5 kg (186 lb 4.6 oz) IBW/kg (Calculated) : 61.5 Heparin Dosing Weight: 79 kg  Vital Signs: Temp: 98 F (36.7 C) (11/15 0800) Temp Source: Oral (11/15 0800) BP: 102/58 (11/15 1800) Pulse Rate: 100 (11/15 1800)  Labs: Recent Labs    10/29/22 0614 10/29/22 1051 10/29/22 1204 10/29/22 1433 10/29/22 1636 10/29/22 2236 10/30/22 0543 10/30/22 1253 10/30/22 1922  HGB 8.3*  --   --   --   --   --  8.4*  --   --   HCT 26.8*  --   --   --   --   --  26.2*  --   --   PLT 123*  --   --   --   --   --  119*  --   --   HEPARINUNFRC  --   --   --   --   --    < > 0.27* 0.38 0.40  CREATININE 0.65  --   --   --   --   --  0.94  --   --   TROPONINIHS  --    < > 241* 1,607* 1,418*  --   --   --   --    < > = values in this interval not displayed.     Estimated Creatinine Clearance: 94 mL/min (by C-G formula based on SCr of 0.94 mg/dL).   Medical History: Past Medical History:  Diagnosis Date   CAD (coronary artery disease)    a. 01/2011 Anterior STEMI/Cath/PCI: LM nl, LAD 100d (3.5x29m Vision BMS placed), LCX 225mRI 50, RCA min irregs, EF 40% w/ apical, inferoapical HK.   Cardiac arrest - ventricular fibrillation    a. In setting of STEMI 01/2011   Chronic pain    [place]   Depression with anxiety    ETOH abuse    a. 6-12 beers/day   GERD (gastroesophageal reflux disease)    Hemorrhoids    Hyperlipidemia    Hypertension    Ischemic cardiomyopathy    a. 06/2011 Echo: EF 45-50%, No rwma   Marijuana abuse    Migraines    Tobacco abuse    a. 1/2 ppd x 26 yrs    Medications:  Scheduled:   aspirin EC  81 mg Oral Daily   calcium-vitamin D  1 tablet Oral BID    Chlorhexidine Gluconate Cloth  6 each Topical Daily   folic acid  1 mg Oral Daily   furosemide  40 mg Intravenous Daily   gabapentin  300 mg Oral TID   hydrocortisone   Rectal BID   ipratropium  0.5 mg Nebulization TID   ketorolac  30 mg Intravenous Q6H   magnesium oxide  400 mg Oral BID   metoprolol tartrate  12.5 mg Oral BID   multivitamin with minerals  1 tablet Oral Daily   nicotine  14 mg Transdermal Daily   pantoprazole  40 mg Oral BID   predniSONE  20 mg Oral Q breakfast   psyllium  1 packet Oral Daily   rosuvastatin  40 mg Oral Daily   thiamine  100 mg Oral Daily    Assessment: [age].o. male w/  PMH of PSA, CAD, anemia with bleeding internal hemorrhoids and now  second elevated cardiac enzyme. A review of medical records reveals no chronic anticoagulation prior to arrival.   Goal of Therapy:  Heparin level 0.3-0.5 units/ml Monitor platelets by anticoagulation protocol: Yes   11/15 0543 HL 0.27, subtherapeutic 11/15 1253 HL 0.38, therapeutic 11/15 1922 HL 0.40, therapeutic x 2  Plan:  Continue heparin infusion at 1400 units/hr Recheck HL daily Continue to monitor H&H and platelets  Lorin Picket, PharmD  10/30/2022 7:53 PM

## 2022-10-30 NOTE — Progress Notes (Signed)
Rounding Note    Patient Name: Ethan Wells Date of Encounter: 10/30/2022  [place] Cardiologist: Lauree Chandler, MD   Subjective   Patient seen on AM rounds. Endorses chest discomfort that is mild, substernal, without radiation. Endorses continued shortness of breath, but is now on 2L O2 via [place]. Endorses seeing blood in stool that is a little more increased this morning. He is continued on Heparin drip for elevated Hs troponins s/p hemorrhoidectomy. -2.2L output in the last 24 hours.  Inpatient Medications    Scheduled Meds:  aspirin EC  81 mg Oral Daily   calcium-vitamin D  1 tablet Oral BID   Chlorhexidine Gluconate Cloth  6 each Topical Daily   folic acid  1 mg Oral Daily   furosemide  40 mg Intravenous Daily   gabapentin  300 mg Oral TID   hydrocortisone   Rectal BID   ipratropium  0.5 mg Nebulization TID   ketorolac  30 mg Intravenous Q6H   magnesium oxide  400 mg Oral BID   metoprolol tartrate  12.5 mg Oral BID   multivitamin with minerals  1 tablet Oral Daily   nicotine  14 mg Transdermal Daily   pantoprazole  40 mg Oral BID   predniSONE  20 mg Oral Q breakfast   psyllium  1 packet Oral Daily   rosuvastatin  40 mg Oral Daily   thiamine  100 mg Oral Daily   Continuous Infusions:  heparin 1,400 Units/hr (10/30/22 0635)   PRN Meds: acetaminophen, HYDROmorphone (DILAUDID) injection, methocarbamol, morphine injection, nitroGLYCERIN, oxyCODONE, polyethylene glycol   Vital Signs    Vitals:   10/30/22 0400 10/30/22 0500 10/30/22 0600 10/30/22 0700  BP: 122/73  121/80 122/85  Pulse: 92 94 93 86  Resp: '17 16 14 15  '$ Temp: 98 F (36.7 C)     TempSrc:      SpO2: 98% 97% 97% 100%  Weight:      Height:        Intake/Output Summary (Last 24 hours) at 10/30/2022 0749 Last data filed at 10/30/2022 0600 Gross per 24 hour  Intake 140.26 ml  Output 2050 ml  Net -1909.74 ml      10/22/2022    2:52 AM 10/21/2022    7:52 PM 10/17/2022     8:51 AM  Last 3 Weights  Weight (lbs) 186 lb 4.6 oz 180 lb 6.4 oz 180 lb 3.2 oz  Weight (kg) 84.5 kg 81.829 kg 81.738 kg      Telemetry     Sinus with PAC's rates of 80-90- Personally Reviewed  ECG    No new tracings - Personally Reviewed  Physical Exam   GEN: No acute distress.  Sitting upright in the bed watching TV Neck: No JVD appreciated Cardiac: RRR, no murmurs, rubs, or gallops.  Respiratory: Diminished to auscultation bilaterally.Respirations are unlabored on 2L O2 via [place]. GI: Soft, slightly tender to mild palpitation, mild distended  MS: No edema; No deformity. Neuro:  Nonfocal  Psych: Normal affect   Labs    High Sensitivity Troponin:   Recent Labs  Lab 10/21/22 2131 10/29/22 1051 10/29/22 1204 10/29/22 1433 10/29/22 1636  TROPONINIHS 13 15 241* 1,607* 1,418*     Chemistry Recent Labs  Lab 10/25/22 0339 10/26/22 0620 10/27/22 0534 10/29/22 0614 10/30/22 0543  NA 138   < > 139 139 138  K 5.4*   < > 4.1 4.7 4.6  CL 112*   < > 112* 110 105  CO2 23   < > '25 24 26  '$ GLUCOSE 138*   < > 100* 146* 131*  BUN 9   < > '10 11 18  '$ CREATININE 0.68   < > 0.72 0.65 0.94  CALCIUM 8.5*   < > 8.8* 8.2* 8.8*  MG 2.2  --   --   --   --   GFRNONAA >60   < > >60 >60 >60  ANIONGAP 3*   < > 2* 5 7   < > = values in this interval not displayed.    Lipids No results for input(s): "CHOL", "TRIG", "HDL", "LABVLDL", "LDLCALC", "CHOLHDL" in the last 168 hours.  Hematology Recent Labs  Lab 10/27/22 0534 10/29/22 0614 10/30/22 0543  WBC 5.5 6.3 10.5  RBC 2.76* 2.79* 2.73*  HGB 8.2* 8.3* 8.4*  HCT 26.1* 26.8* 26.2*  MCV 94.6 96.1 96.0  MCH 29.7 29.7 30.8  MCHC 31.4 31.0 32.1  RDW 20.8* 21.1* 22.6*  PLT 133* 123* 119*   Thyroid No results for input(s): "TSH", "FREET4" in the last 168 hours.  BNP Recent Labs  Lab 10/29/22 1433  BNP 1,049.3*    DDimer No results for input(s): "DDIMER" in the last 168 hours.   Radiology    CT Angio Chest Pulmonary Embolism (PE)  W or WO Contrast  Result Date: 10/29/2022 CLINICAL DATA:  Pulmonary embolism (PE) suspected, low to intermediate prob, positive D-dimer EXAM: CT ANGIOGRAPHY CHEST WITH CONTRAST TECHNIQUE: Multidetector CT imaging of the chest was performed using the standard protocol during bolus administration of intravenous contrast. Multiplanar CT image reconstructions and MIPs were obtained to evaluate the vascular anatomy. RADIATION DOSE REDUCTION: This exam was performed according to the departmental dose-optimization program which includes automated exposure control, adjustment of the mA and/or kV according to patient size and/or use of iterative reconstruction technique. CONTRAST:  41m OMNIPAQUE IOHEXOL 350 MG/ML SOLN COMPARISON:  Chest x-ray 10/29/2022, CT angio chest 09/30/2022 FINDINGS: Cardiovascular: Satisfactory opacification of the pulmonary arteries to the segmental level. No evidence of pulmonary embolism. The main pulmonary artery is enlarged measuring up to 3.6 cm. Normal heart size. No significant pericardial effusion. The thoracic aorta is normal in caliber. Mild atherosclerotic plaque of the thoracic aorta. At least 2 vessel coronary artery calcifications. Mediastinum/Nodes: No enlarged mediastinal, hilar, or axillary lymph nodes. Thyroid gland, trachea, and esophagus demonstrate no significant findings. Lungs/Pleura: Peribronchovascular ground-glass airspace opacities scattered throughout the lungs and slightly more prominent in the upper lobes. No pulmonary nodule. No pulmonary mass. Trace bilateral pleural effusions. No pneumothorax. Upper Abdomen: Splenomegaly. Query pericholecystic fluid and gallbladder wall thickening Musculoskeletal: No chest wall abnormality. No suspicious lytic or blastic osseous lesions. No acute displaced fracture. Review of the MIP images confirms the above findings. IMPRESSION: 1. No pulmonary embolus. 2. Enlarged main pulmonary artery suggestive of pulmonary hypertension. 3.  Diffuse peribronchovascular ground-glass airspace opacities suggestive of infection/inflammation. Consider repeat CT chest in 3 months to evaluate for resolution. 4. Trace bilateral pleural effusions. 5. Splenomegaly. 6. Query pericholecystic fluid and gallbladder wall thickening. Correlate with liver function-if clinically indicated consider right upper quadrant ultrasound. Electronically Signed   By: MIven FinnM.D.   On: 10/29/2022 17:15   DG Chest Port 1 View  Result Date: 10/29/2022 CLINICAL DATA:  Shortness of breath EXAM: PORTABLE CHEST 1 VIEW COMPARISON:  Chest x-ray dated October 21, 2022 FINDINGS: Unchanged cardiomegaly. New diffuse interstitial opacities. No large pleural effusion or evidence of pneumothorax. IMPRESSION: New diffuse interstitial opacities, compatible with pulmonary edema.  Electronically Signed   By: Yetta Glassman M.D.   On: 10/29/2022 11:02    Cardiac Studies   Lexiscan MPI 10/03/2022   The study is low risk.   Findings are consistent with prior myocardial infarction. No ischemia.   No ST deviation was noted.   LV perfusion is abnormal. There is no evidence of ischemia. There is no evidence of infarction. Defect 1: There is a small defect with severe reduction in uptake present in the basal inferior location(s) that is fixed. There is abnormal wall motion in the defect area. Consistent with infarction.   Left ventricular function is abnormal. There was a single regional abnormality. Nuclear stress EF: 53 %. The left ventricular ejection fraction is mildly decreased (45-54%). End diastolic cavity size is normal. End systolic cavity size is normal.   TTE 10/01/2022 1. Left ventricular ejection fraction, by estimation, is 60 to 65%. Left  ventricular ejection fraction by 2D MOD biplane is 64.0 %. The left  ventricle has normal function. The left ventricle has no regional wall  motion abnormalities. The left  ventricular internal cavity size was mildly dilated.  Left ventricular  diastolic parameters are consistent with Grade I diastolic dysfunction  (impaired relaxation).   2. Right ventricular systolic function is normal. The right ventricular  size is normal.   3. Left atrial size was moderately dilated.   4. The mitral valve is grossly normal. Trivial mitral valve  regurgitation.   5. The aortic valve is tricuspid. Aortic valve regurgitation is not  visualized.   Patient Profile     51 y.o. male with a history of polysubstance abuse, coronary artery disease, anemia due to hemorrhoids, QT prolongation, chronic pain syndrome, who is being seen and evaluated for acute respiratory failure and elevated troponins.   Assessment & Plan    Acute respiratory distress -oxygen has been weaned down to 2L [place] -CTA chest negative for PE -continue with nebulizers -pulmonary toileting -chest CT with peribronchovascular ground-glass airspace opacities, recommend repeat chest CT in 3 months  Coronary artery disease with chronic angina and elevated high sensitivity troponins -continues to endorse the same feeling chest discomfort he has had since his MI in 2012 -recent MPI was low risk study -Hs troponins trended and peaked at 1607 -most likely thought to be supply-demand mismatch in the setting of known CAD and ischemic cardiomyopathy complicated by flash pulmonary edema -currently on Heparin infusion, if no signs of active bleeding and stable Hgb run for 48 hours -challenge with DAPT before proceeding with LHC in the setting of his anemia -If he remains stable on DAPT and has stable hemoglobins potential left heart cath Friday of this week -continue on ASA and Heparin infusion -continued on statin therapy  History of ischemic cardiomyopathy with recovered EF  -limited echocardiogram ordered and pending - -2.2L output in the last 24 hours -continue furosemide 40 mg IVP daily -continued on metoprolol tartrate 12.5 mg bid -LVEF 60-65% 10/01/2022,  NRWMA, G1DD, no valvular abnormalities -daily weight, I&O, low sodium diet  Mesenteric artery stenosis -recent SMA stenting by Dr. Lucky Cowboy -endorses decreased abdominal pain  Anemia -Hgb 8.4 -noted rectal bleeding but is s/p recent hemorrhoidectomy -daily cbc  Polysubstance abuse -continue with nicotine patch -total cessation is recommended     For questions or updates, please contact [place] Please consult [email or web address] for contact info under        Signed, Leita Lindbloom, NP  10/30/2022, 7:49 AM

## 2022-10-30 NOTE — Progress Notes (Signed)
Pts bp has been running soft since mid morning and has steadily become more soft, I am holding all pain meds and any ordered lasix until bp comes up, I have advised pt, MD made aware as well and agrees with plan.

## 2022-10-30 NOTE — Progress Notes (Signed)
Progress Note   [name] HFW:[id number] DOB: 10-14-1971 DOA: 10/21/2022     9 DOS: the patient was seen and examined on 10/30/2022   Brief hospital course: Ethan Wells is a 51 y.o. male with medical history significant of polysubstance abuse, coronary artery disease, anemia due to hemmoroids, QT prolongation chronic pain syndrome, who presents to the ER today on account of generalized body aches and pains.  At the ER he was found to have potassium of 2.2 subsequently started on IV potassium.  Patient is a poor historian. During my encounter patient was complaining of pain in his arm due to the IV potassium. He endorses recent use of cocaine and alcohol.   Labs concerning for hyponatremia sodium 133, potassium 2.2, magnesium 1.7, hemoglobin 8.8.   Chest x-ray shows no pneumonia, pneumothorax or pleural effusion.   EKG shows no acute ST-T wave changes, first 2 sets of high-sensitivity troponins were 13, 13.  Patient was started on a trial of Solu-Medrol to see if this helps out his pains.  ANA negative. Rheumatoid factor slightly elevated.  Dr. Lucky Cowboy did a angiogram and placed a stent in the superior mesenteric artery.  Patient's epigastric pain improved after this.  Patient on aspirin.  Patient complaining of rectal bleeding.  Reviewed recent colonoscopy and flexible sigmoidoscopy and the patient does have some bleeding hemorrhoids.  Anusol ordered.  Dr. Hampton Abbot did a hemorrhoidal procedure on 10/28/2022.  On 10/29/2022, the patient had some chest pain and then developed shortness of breath.  EKGs were done which showed nonspecific changes.  Troponins pending.  Chest x-ray looks like pulmonary vascular congestion and 60 mg of Lasix was given.  The patient was started on BiPAP and transferred to the stepdown unit.  We will give another dose of IV Lasix this evening.  Reviewed recent echocardiogram with an EF of 60%.  Case discussed with Dr. Saunders Revel cardiology.  Aspirin  restarted.  11/15: Hemodynamically stable on 2 L this morning.  Troponin peaked at 1607, CTA was done for concern of PE and it was negative for PE, did have some finding consistent with pulmonary hypertension and bilateral groundglass opacities with mild pleural effusion. He was placed on heparin infusion, most likely will need cardiac cath based on his cardiac history but cardiology would like to give him a DAPT challenge before due to recent GI bleed. Hemoglobin currently stable around 8.4.  BNP elevated at 1049. Patient continued to have mild intermittent hematochezia, per surgery it is common after hemorrhoidectomy.  Assessment and Plan: * Acute on chronic diastolic CHF (congestive heart failure) ([place]) Patient with some chest pain and shortness of breath today.  EKG shows nonspecific ST-T wave changes.  Chest x-ray consistent with heart failure.  Patient given 60 mg of IV Lasix will repeat in the afternoon.  Troponins elevated, BNP at 1049.  Recent echocardiogram with normal EF and no regional wall motion abnormalities. - Cardiology was  consultation.  Restarted aspirin.   -Continue with IV Lasix 40 mg daily -Daily weight and BMP -Strict intake and output  Acute respiratory distress Secondary to heart failure.  Requiring BiPAP overnight, not transition back to baseline oxygen requirement of 2 L. -Continue supplemental oxygen -Continue with BiPAP as needed  Hemorrhoids, internal, with bleeding Dr. Hampton Abbot took to the operating room yesterday for hemorrhoid procedure.  He recommended holding aspirin for another day prior to restarting but with today's events needed to restart aspirin.  Mesenteric artery stenosis (HCC) Mesenteric ischemia.  Dr. Lucky Cowboy  placed a stent in the SMA on 10/24/2022.  Restarted aspirin.   Arterial ultrasound negative for pseudoaneurysm.  NSTEMI (non-ST elevated myocardial infarction) [facility]) Cardiac enzymes negative on admission.  Patient had a recent stress test that  was a low risk scan.  Test was consistent with prior myocardial infarction.  No ischemia seen.  Repeat chest pain yesterday which also resulted in elevated troponin which peaked at 1600, concern of NSTEMI.  He was started on heparin infusion. Patient will need cardiac catheterization per cardiology but they would like to see stable hemoglobin and no more bleeding on heparin. -Continue with heparin -Continue with supportive care  Epigastric pain Seems to improved after placing a stent in the SMA.  Also reviewed endoscopy from 05/30/2022 showing esophagitis and portal hypertensive gastropathy and duodenitis.  We will continue empiric PPI.  CAD (coronary artery disease) On Crestor, restarted aspirin.  Hyperkalemia Improved  Elevated rheumatoid factor Will refer to rheumatology as outpatient.  Patient not the best candidate for methotrexate with history of drinking.  We will continue low-dose steroids for now.  Iron deficiency anemia Last hemoglobin 8.3.  IV iron was given in the hospital 10/25/2022 and 10/27/22.  Prolonged QT interval Monitoring on telemetry  Alcohol dependence ([place]) No signs of withdrawal.  Continue folate thiamine.   Subjective: Patient was seen and examined today.  Breathing status at baseline.  Had a bowel movement with small amount of blood in it this morning.  He was feeling weak.  Physical Exam: Vitals:   10/30/22 0700 10/30/22 0756 10/30/22 0800 10/30/22 0900  BP: 122/85 122/85 112/64 118/73  Pulse: 86 88 83 91  Resp: '15  14 14  '$ Temp:   98 F (36.7 C)   TempSrc:   Oral   SpO2: 100%  100% 100%  Weight:      Height:       General.  Well-developed gentleman, in no acute distress. Pulmonary.  Lungs clear bilaterally, normal respiratory effort. CV.  Regular rate and rhythm, no JVD, rub or murmur. Abdomen.  Soft, nontender, nondistended, BS positive. CNS.  Alert and oriented .  No focal neurologic deficit. Extremities.  No edema, no cyanosis, pulses  intact and symmetrical. Psychiatry.  Judgment and insight appears normal.   Data Reviewed: Prior data reviewed  Family Communication: Discussed with patient  Disposition: Status is: Inpatient Remains inpatient appropriate because: Severity of illness  Planned Discharge Destination: Home  Prophylaxis.  Heparin fusion Time spent: 50 minutes  This record has been created using Systems analyst. Errors have been sought and corrected,but may not always be located. Such creation errors do not reflect on the standard of care.   Author: Lorella Nimrod, MD 10/30/2022 1:32 PM  For on call review [email or web address].

## 2022-10-31 ENCOUNTER — Encounter
Admission: EM | Disposition: A | Discharge status: Home / Self Care | Payer: Self-pay | Source: Home / Self Care | Attending: Internal Medicine

## 2022-10-31 ENCOUNTER — Inpatient Hospital Stay: Payer: Medicare HMO | Admitting: Certified Registered Nurse Anesthetist

## 2022-10-31 ENCOUNTER — Other Ambulatory Visit: Payer: Self-pay

## 2022-10-31 DIAGNOSIS — F1029 Alcohol dependence with unspecified alcohol-induced disorder: Secondary | ICD-10-CM | POA: Diagnosis not present

## 2022-10-31 DIAGNOSIS — K648 Other hemorrhoids: Secondary | ICD-10-CM | POA: Diagnosis not present

## 2022-10-31 DIAGNOSIS — K551 Chronic vascular disorders of intestine: Secondary | ICD-10-CM | POA: Diagnosis not present

## 2022-10-31 DIAGNOSIS — K625 Hemorrhage of anus and rectum: Secondary | ICD-10-CM | POA: Diagnosis not present

## 2022-10-31 DIAGNOSIS — I214 Non-ST elevation (NSTEMI) myocardial infarction: Secondary | ICD-10-CM | POA: Diagnosis not present

## 2022-10-31 DIAGNOSIS — R0603 Acute respiratory distress: Secondary | ICD-10-CM | POA: Diagnosis not present

## 2022-10-31 HISTORY — PX: EVALUATION UNDER ANESTHESIA WITH HEMORRHOIDECTOMY: SHX5624

## 2022-10-31 LAB — CBC
HCT: 25 % — ABNORMAL LOW (ref 39.0–52.0)
HCT: 26.1 % — ABNORMAL LOW (ref 39.0–52.0)
Hemoglobin: 8 g/dL — ABNORMAL LOW (ref 13.0–17.0)
Hemoglobin: 8.3 g/dL — ABNORMAL LOW (ref 13.0–17.0)
MCH: 30.5 pg (ref 26.0–34.0)
MCH: 30.6 pg (ref 26.0–34.0)
MCHC: 32 g/dL (ref 30.0–36.0)
MCHC: 31.8 g/dL (ref 30.0–36.0)
MCV: 95.4 fL (ref 80.0–100.0)
MCV: 96.3 fL (ref 80.0–100.0)
Platelets: 111 10*3/uL — ABNORMAL LOW (ref 150–400)
Platelets: 103 10*3/uL — ABNORMAL LOW (ref 150–400)
RBC: 2.62 MIL/uL — ABNORMAL LOW (ref 4.22–5.81)
RBC: 2.71 MIL/uL — ABNORMAL LOW (ref 4.22–5.81)
RDW: 22.9 % — ABNORMAL HIGH (ref 11.5–15.5)
RDW: 22.7 % — ABNORMAL HIGH (ref 11.5–15.5)
WBC: 8.1 10*3/uL (ref 4.0–10.5)
WBC: 8.9 10*3/uL (ref 4.0–10.5)
nRBC: 0 % (ref 0.0–0.2)
nRBC: 0 % (ref 0.0–0.2)

## 2022-10-31 LAB — BASIC METABOLIC PANEL
Anion gap: 7 (ref 5–15)
BUN: 20 mg/dL (ref 6–20)
CO2: 25 mmol/L (ref 22–32)
Calcium: 8.6 mg/dL — ABNORMAL LOW (ref 8.9–10.3)
Chloride: 108 mmol/L (ref 98–111)
Creatinine, Ser: 0.84 mg/dL (ref 0.61–1.24)
GFR, Estimated: >60 mL/min (ref >60)
Glucose, Bld: 144 mg/dL — ABNORMAL HIGH (ref 70–99)
Potassium: 3.7 mmol/L (ref 3.5–5.1)
Sodium: 140 mmol/L (ref 135–145)

## 2022-10-31 LAB — HEPARIN LEVEL (UNFRACTIONATED): Heparin Unfractionated: 0.26 IU/mL — ABNORMAL LOW (ref 0.30–0.70)

## 2022-10-31 SURGERY — EXAM UNDER ANESTHESIA WITH HEMORRHOIDECTOMY
Anesthesia: General | Site: Rectum

## 2022-10-31 MED ORDER — PROPOFOL 10 MG/ML IV BOLUS
INTRAVENOUS | Status: AC
  Filled 2022-10-31: qty 20

## 2022-10-31 MED ORDER — LIDOCAINE HCL (CARDIAC) PF 100 MG/5ML IV SOSY
PREFILLED_SYRINGE | INTRAVENOUS | Status: DC | PRN
  Administered 2022-10-31: 100 mg via INTRAVENOUS

## 2022-10-31 MED ORDER — GELATIN ABSORBABLE 100 EX MISC
CUTANEOUS | Status: DC | PRN
  Administered 2022-10-31: 1

## 2022-10-31 MED ORDER — DEXMEDETOMIDINE HCL IN NACL 80 MCG/20ML IV SOLN
INTRAVENOUS | Status: DC | PRN
  Administered 2022-10-31: 4 ug via INTRAVENOUS

## 2022-10-31 MED ORDER — PROPOFOL 10 MG/ML IV BOLUS
INTRAVENOUS | Status: DC | PRN
  Administered 2022-10-31: 50 mg via INTRAVENOUS
  Administered 2022-10-31: 150 mg via INTRAVENOUS

## 2022-10-31 MED ORDER — LACTATED RINGERS IV SOLN: INTRAVENOUS | Status: DC | PRN

## 2022-10-31 MED ORDER — PROCHLORPERAZINE EDISYLATE 10 MG/2ML IJ SOLN: 10.0000 mg | Freq: Once | INTRAMUSCULAR | Status: DC

## 2022-10-31 MED ORDER — HYDROMORPHONE HCL 1 MG/ML IJ SOLN
INTRAMUSCULAR | Status: AC
  Filled 2022-10-31: qty 1

## 2022-10-31 MED ORDER — HEPARIN (PORCINE) 25000 UT/250ML-% IV SOLN
1500.0000 [IU]/h | INTRAVENOUS | Status: DC
  Administered 2022-11-01: 1500 [IU]/h via INTRAVENOUS
  Filled 2022-10-31 (×2): qty 250

## 2022-10-31 MED ORDER — SUGAMMADEX SODIUM 200 MG/2ML IV SOLN
INTRAVENOUS | Status: DC | PRN
  Administered 2022-10-31: 200 mg via INTRAVENOUS

## 2022-10-31 MED ORDER — MORPHINE SULFATE (PF) 2 MG/ML IV SOLN
0.5000 mg | INTRAVENOUS | Status: DC | PRN
  Administered 2022-10-31 (×3): 0.5 mg via INTRAVENOUS
  Filled 2022-10-31 (×3): qty 1

## 2022-10-31 MED ORDER — OXYCODONE HCL 5 MG PO TABS
ORAL_TABLET | ORAL | Status: AC
  Filled 2022-10-31: qty 1

## 2022-10-31 MED ORDER — ONDANSETRON HCL 4 MG/2ML IJ SOLN
INTRAMUSCULAR | Status: AC
  Filled 2022-10-31: qty 2

## 2022-10-31 MED ORDER — MIDAZOLAM HCL 2 MG/2ML IJ SOLN
INTRAMUSCULAR | Status: AC
  Filled 2022-10-31: qty 2

## 2022-10-31 MED ORDER — GELATIN ABSORBABLE 100 CM EX MISC
CUTANEOUS | Status: AC
  Filled 2022-10-31: qty 1

## 2022-10-31 MED ORDER — OXYCODONE HCL 5 MG PO TABS
5.0000 mg | ORAL_TABLET | Freq: Once | ORAL | Status: AC | PRN
  Administered 2022-10-31: 5 mg via ORAL

## 2022-10-31 MED ORDER — GELATIN ABSORBABLE 12-7 MM EX MISC
CUTANEOUS | Status: AC
  Filled 2022-10-31: qty 1

## 2022-10-31 MED ORDER — BUPIVACAINE-EPINEPHRINE (PF) 0.5% -1:200000 IJ SOLN
INTRAMUSCULAR | Status: DC | PRN
  Administered 2022-10-31: 50 mL

## 2022-10-31 MED ORDER — PHENYLEPHRINE 80 MCG/ML (10ML) SYRINGE FOR IV PUSH (FOR BLOOD PRESSURE SUPPORT)
PREFILLED_SYRINGE | INTRAVENOUS | Status: DC | PRN
  Administered 2022-10-31: 160 ug via INTRAVENOUS
  Administered 2022-10-31: 80 ug via INTRAVENOUS
  Administered 2022-10-31 (×2): 160 ug via INTRAVENOUS

## 2022-10-31 MED ORDER — OXYCODONE HCL 5 MG/5ML PO SOLN: 5.0000 mg | Freq: Once | ORAL | Status: AC | PRN

## 2022-10-31 MED ORDER — HYDROMORPHONE HCL 1 MG/ML IJ SOLN
0.5000 mg | INTRAMUSCULAR | Status: DC | PRN
  Administered 2022-11-01 – 2022-11-03 (×20): 0.5 mg via INTRAVENOUS
  Filled 2022-10-31 (×20): qty 1

## 2022-10-31 MED ORDER — FENTANYL CITRATE (PF) 100 MCG/2ML IJ SOLN
INTRAMUSCULAR | Status: DC | PRN
  Administered 2022-10-31 (×2): 50 ug via INTRAVENOUS

## 2022-10-31 MED ORDER — BUPIVACAINE LIPOSOME 1.3 % IJ SUSP
INTRAMUSCULAR | Status: AC
  Filled 2022-10-31: qty 20

## 2022-10-31 MED ORDER — ONDANSETRON HCL 4 MG/2ML IJ SOLN
INTRAMUSCULAR | Status: DC | PRN
  Administered 2022-10-31: 4 mg via INTRAVENOUS

## 2022-10-31 MED ORDER — HYDROMORPHONE HCL 1 MG/ML IJ SOLN
0.2500 mg | INTRAMUSCULAR | Status: DC | PRN
  Administered 2022-10-31 (×2): 0.25 mg via INTRAVENOUS

## 2022-10-31 MED ORDER — MIDAZOLAM HCL 2 MG/2ML IJ SOLN
INTRAMUSCULAR | Status: DC | PRN
  Administered 2022-10-31: 2 mg via INTRAVENOUS

## 2022-10-31 MED ORDER — FENTANYL CITRATE (PF) 100 MCG/2ML IJ SOLN
INTRAMUSCULAR | Status: AC
  Filled 2022-10-31: qty 2

## 2022-10-31 MED ORDER — BUPIVACAINE-EPINEPHRINE (PF) 0.5% -1:200000 IJ SOLN
INTRAMUSCULAR | Status: AC
  Filled 2022-10-31: qty 30

## 2022-10-31 MED ORDER — LIDOCAINE HCL (PF) 2 % IJ SOLN
INTRAMUSCULAR | Status: AC
  Filled 2022-10-31: qty 5

## 2022-10-31 MED ORDER — ROCURONIUM BROMIDE 100 MG/10ML IV SOLN
INTRAVENOUS | Status: DC | PRN
  Administered 2022-10-31: 40 mg via INTRAVENOUS

## 2022-10-31 MED ORDER — DEXAMETHASONE SODIUM PHOSPHATE 10 MG/ML IJ SOLN
INTRAMUSCULAR | Status: DC | PRN
  Administered 2022-10-31: 10 mg via INTRAVENOUS

## 2022-10-31 MED ORDER — 0.9 % SODIUM CHLORIDE (POUR BTL) OPTIME
TOPICAL | Status: DC | PRN
  Administered 2022-10-31: 1000 mL

## 2022-10-31 MED ORDER — POLYETHYLENE GLYCOL 3350 17 G PO PACK
17.0000 g | PACK | Freq: Every day | ORAL | Status: DC
  Administered 2022-11-01 – 2022-11-07 (×5): 17 g via ORAL
  Filled 2022-10-31 (×6): qty 1

## 2022-10-31 SURGICAL SUPPLY — 34 items
BRIEF MESH DISP 2XL (UNDERPADS AND DIAPERS) ×1 IMPLANT
DRAPE PERI LITHO V/GYN (MISCELLANEOUS) ×1 IMPLANT
DRAPE UNDER BUTTOCK W/FLU (DRAPES) ×1 IMPLANT
DRSG GAUZE FLUFF 36X18 (GAUZE/BANDAGES/DRESSINGS) ×1 IMPLANT
ELECT CAUTERY BLADE TIP 2.5 (TIP) ×1 IMPLANT
ELECT REM PT RETURN 9FT ADLT (ELECTROSURGICAL) ×1 IMPLANT
ELECTRODE CAUTERY BLDE TIP 2.5 (TIP) ×1 IMPLANT
ELECTRODE REM PT RTRN 9FT ADLT (ELECTROSURGICAL) ×1 IMPLANT
GAUZE 4X4 16PLY ~~LOC~~+RFID DBL (SPONGE) ×1 IMPLANT
GLOVE SURG SYN 7.0 (GLOVE) ×1 IMPLANT
GLOVE SURG SYN 7.0 PF PI (GLOVE) ×1 IMPLANT
GLOVE SURG SYN 7.5  E (GLOVE) ×1
GLOVE SURG SYN 7.5 E (GLOVE) ×1 IMPLANT
GLOVE SURG SYN 7.5 PF PI (GLOVE) ×1 IMPLANT
GOWN STRL REUS W/ TWL LRG LVL3 (GOWN DISPOSABLE) ×2 IMPLANT
GOWN STRL REUS W/TWL LRG LVL3 (GOWN DISPOSABLE) ×2
KIT TURNOVER KIT A (KITS) ×1 IMPLANT
LABEL OR SOLS (LABEL) ×1 IMPLANT
MANIFOLD NEPTUNE II (INSTRUMENTS) ×1 IMPLANT
NEEDLE HYPO 22GX1.5 SAFETY (NEEDLE) ×1 IMPLANT
NS IRRIG 500ML POUR BTL (IV SOLUTION) ×1 IMPLANT
PACK BASIN MINOR ARMC (MISCELLANEOUS) ×1 IMPLANT
PAD OB MATERNITY 4.3X12.25 (PERSONAL CARE ITEMS) ×1 IMPLANT
PAD PREP 24X41 OB/GYN DISP (PERSONAL CARE ITEMS) ×1 IMPLANT
SHEARS HARMONIC 9CM CVD (BLADE) ×1 IMPLANT
SOL PREP PVP 2OZ (MISCELLANEOUS) ×1 IMPLANT
SOLUTION PREP PVP 2OZ (MISCELLANEOUS) ×1 IMPLANT
SURGILUBE 2OZ TUBE FLIPTOP (MISCELLANEOUS) ×1 IMPLANT
SUT VIC AB 2-0 SH 27 (SUTURE) ×2
SUT VIC AB 2-0 SH 27XBRD (SUTURE) ×2 IMPLANT
SYR 10ML LL (SYRINGE) ×1 IMPLANT
SYR BULB IRRIG 60ML STRL (SYRINGE) ×1 IMPLANT
TRAP FLUID SMOKE EVACUATOR (MISCELLANEOUS) ×1 IMPLANT
WATER STERILE IRR 500ML POUR (IV SOLUTION) ×1 IMPLANT

## 2022-10-31 NOTE — Assessment & Plan Note (Addendum)
Dr. Hampton Abbot took to the operating room for hemorrhoid procedure.  Patient had another significant bleed this morning and was taken to the OR again and found to have 2 bleeding pedicles which were ligated. Hemoglobin improved to 9.6 after getting 1 unit of PRBC.  Had a small amount of bleeding during defecation. -Discontinue heparin -Patient should avoid straining -Transfuse if below 8

## 2022-10-31 NOTE — Anesthesia Preprocedure Evaluation (Signed)
Anesthesia Evaluation  Patient identified by MRN, date of birth, ID band Patient awake  General Assessment Comment:Patient presented with chest pain , cardiac workup negative. Had mesenteric angiogram on 11/9 with Dr. Lucky Cowboy with stenting to SMA. Started having rectal bleeding postprocedure  11/13: Patient went for EUA with hemorrhoidectomy, postop patient restarted on DAPT by cardiology for potential L heart cath  11/15: Patient has repeat episode of rectal bleeding    Reviewed: Allergy & Precautions, NPO status , Patient's Chart, lab work & pertinent test results  History of Anesthesia Complications Negative for: history of anesthetic complications  Airway Mallampati: III  TM Distance: >3 FB Neck ROM: full    Dental  (+) Poor Dentition, Edentulous Upper   Pulmonary Current Smoker and Patient abstained from smoking.   Pulmonary exam normal        Cardiovascular hypertension, + CAD, + Past MI, + Peripheral Vascular Disease and +CHF  Normal cardiovascular exam     Neuro/Psych  Headaches PSYCHIATRIC DISORDERS Anxiety Depression       GI/Hepatic ,GERD  Medicated,,(+)     substance abuse  alcohol use, cocaine use and marijuana use  Endo/Other  negative endocrine ROS    Renal/GU      Musculoskeletal   Abdominal   Peds  Hematology  (+) Blood dyscrasia, anemia   Anesthesia Other Findings Past Medical History: No date: CAD (coronary artery disease)     Comment:  a. 01/2011 Anterior STEMI/Cath/PCI: LM nl, LAD 100d               (3.5x8m Vision BMS placed), LCX 258mRI 50, RCA min               irregs, EF 40% w/ apical, inferoapical HK. No date: Cardiac arrest - ventricular fibrillation     Comment:  a. In setting of STEMI 01/2011 No date: Chronic pain No [place]: [place]: Depression with anxiety No date: ETOH abuse     Comment:  a. 6-12 beers/day No date: GERD (gastroesophageal reflux disease) No date:  Hemorrhoids No date: Hyperlipidemia No date: Hypertension No date: Ischemic cardiomyopathy     Comment:  a. 06/2011 Echo: EF 45-50%, No rwma No date: Marijuana abuse No date: Migraines No date: Tobacco abuse     Comment:  a. 1/2 ppd x 26 yrs  Past Surgical History: 08/09/2021: COLONOSCOPY     Comment:  Procedure: COLONOSCOPY;  Surgeon: WoLucilla LameMD;                Location: [facility] ENDOSCOPY;  Service: Endoscopy;; No date: CORONARY ANGIOPLASTY WITH STENT PLACEMENT 05/30/2022: ESOPHAGOGASTRODUODENOSCOPY; N/A     Comment:  Procedure: ESOPHAGOGASTRODUODENOSCOPY (EGD);  Surgeon:               VaLin LandsmanMD;  [facility];                Service: Gastroenterology;  Laterality: N/A; 03/01/2021: ESOPHAGOGASTRODUODENOSCOPY (EGD) WITH PROPOFOL; N/A     Comment:  Procedure: ESOPHAGOGASTRODUODENOSCOPY (EGD) WITH               PROPOFOL;  Surgeon: VaLin LandsmanMD;  Location:               [place] Service: Endoscopy;  Laterality:               N/A; 08/09/2021: ESOPHAGOGASTRODUODENOSCOPY (EGD) WITH PROPOFOL; N/A     Comment:  Procedure: ESOPHAGOGASTRODUODENOSCOPY (EGD) WITH  PROPOFOL;  Surgeon: Lucilla Lame, MD;  Location: [facility]               ENDOSCOPY;  Service: Endoscopy;  Laterality: N/A; 10/02/2022: FLEXIBLE SIGMOIDOSCOPY; N/A     Comment:  Procedure: FLEXIBLE SIGMOIDOSCOPY;  Surgeon: Ladene Artist, MD;  Location: [place];  Service:               Gastroenterology;  Laterality: N/A; 10/24/2022: VISCERAL ANGIOGRAPHY; N/A     Comment:  Procedure: VISCERAL ANGIOGRAPHY;  Surgeon: Algernon Huxley,              MD;  Location: [place] [facility];  Service:               Cardiovascular;  Laterality: N/A;  BMI    Body Mass Index: 31.00 kg/m      Reproductive/Obstetrics negative OB ROS                             Anesthesia Physical Anesthesia Plan  ASA: 3  Anesthesia Plan: General LMA    Post-op Pain Management: Tylenol PO (pre-op)*, Toradol IV (intra-op)* and Dilaudid IV   Induction: Intravenous  PONV Risk Score and Plan: 2 and Dexamethasone, Ondansetron, Midazolam and Treatment may vary due to age or medical condition  Airway Management Planned: LMA  Additional Equipment:   Intra-op Plan:   Post-operative Plan: Extubation in OR  Informed Consent: I have reviewed the patients History and Physical, chart, labs and discussed the procedure including the risks, benefits and alternatives for the proposed anesthesia with the patient or authorized representative who has indicated his/her understanding and acceptance.     Dental Advisory Given  Plan Discussed with: Anesthesiologist, CRNA and Surgeon  Anesthesia Plan Comments: (Patient consented for risks of anesthesia including but not limited to:  - adverse reactions to medications - damage to eyes, teeth, lips or other oral mucosa - nerve damage due to positioning  - sore throat or hoarseness - Damage to heart, brain, nerves, lungs, other parts of body or loss of life  Patient voiced understanding.)        Anesthesia Quick Evaluation

## 2022-10-31 NOTE — Progress Notes (Signed)
Rounding Note    Patient Name: Ethan Wells Date of Encounter: 10/31/2022  [place] Cardiologist: Lauree Chandler, MD   Subjective   Hemorrhoid procedure November 13 Reports having bloody bowel movement overnight 200 cc blood reported, had 10/10 pain with bowel movement He reports that pain is getting worse not better Heparin held overnight, remains on aspirin Surgery planning to take him back to the OR hemorrhoidectomy site  Inpatient Medications    Scheduled Meds:  aspirin EC  81 mg Oral Daily   calcium-vitamin D  1 tablet Oral BID   Chlorhexidine Gluconate Cloth  6 each Topical Daily   folic acid  1 mg Oral Daily   furosemide  40 mg Intravenous Daily   gabapentin  300 mg Oral TID   hydrocortisone   Rectal BID   ipratropium  0.5 mg Nebulization TID   ketorolac  30 mg Intravenous Q6H   magnesium oxide  400 mg Oral BID   metoprolol tartrate  12.5 mg Oral BID   multivitamin with minerals  1 tablet Oral Daily   nicotine  14 mg Transdermal Daily   pantoprazole  40 mg Oral BID   predniSONE  20 mg Oral Q breakfast   psyllium  1 packet Oral Daily   rosuvastatin  40 mg Oral Daily   thiamine  100 mg Oral Daily   Continuous Infusions:  heparin Stopped (10/31/22 0536)   PRN Meds: acetaminophen, HYDROmorphone (DILAUDID) injection, methocarbamol, morphine injection, nitroGLYCERIN, oxyCODONE, polyethylene glycol   Vital Signs    Vitals:   10/31/22 0500 10/31/22 0600 10/31/22 0700 10/31/22 0718  BP:  113/75 110/63   Pulse:  86 80   Resp: (!) '23 18 15   '$ Temp:      TempSrc:      SpO2:  98% 90% 95%  Weight:      Height:        Intake/Output Summary (Last 24 hours) at 10/31/2022 0926 Last data filed at 10/31/2022 2979 Gross per 24 hour  Intake 1007.23 ml  Output 3475 ml  Net -2467.77 ml      10/22/2022    2:52 AM 10/21/2022    7:52 PM 10/17/2022    8:51 AM  Last 3 Weights  Weight (lbs) 186 lb 4.6 oz 180 lb 6.4 oz 180 lb 3.2 oz  Weight  (kg) 84.5 kg 81.829 kg 81.738 kg      Telemetry    Normal sinus rhythm- Personally Reviewed  ECG     - Personally Reviewed  Physical Exam   GEN: No acute distress.  obese Neck: No JVD Cardiac: RRR, no murmurs, rubs, or gallops.  Respiratory: Clear to auscultation bilaterally. GI: Soft, nontender, non-distended  MS: No edema; No deformity. Neuro:  Nonfocal  Psych: Normal affect   Labs    High Sensitivity Troponin:   Recent Labs  Lab 10/21/22 2131 10/29/22 1051 10/29/22 1204 10/29/22 1433 10/29/22 1636  TROPONINIHS 13 15 241* 1,607* 1,418*     Chemistry Recent Labs  Lab 10/25/22 0339 10/26/22 0620 10/29/22 0614 10/30/22 0543 10/31/22 0604  NA 138   < > 139 138 140  K 5.4*   < > 4.7 4.6 3.7  CL 112*   < > 110 105 108  CO2 23   < > '24 26 25  '$ GLUCOSE 138*   < > 146* 131* 144*  BUN 9   < > '11 18 20  '$ CREATININE 0.68   < > 0.65 0.94 0.84  CALCIUM  8.5*   < > 8.2* 8.8* 8.6*  MG 2.2  --   --   --   --   GFRNONAA >60   < > >60 >60 >60  ANIONGAP 3*   < > '5 7 7   '$ < > = values in this interval not displayed.    Lipids No results for input(s): "CHOL", "TRIG", "HDL", "LABVLDL", "LDLCALC", "CHOLHDL" in the last 168 hours.  Hematology Recent Labs  Lab 10/29/22 0614 10/30/22 0543 10/31/22 0604  WBC 6.3 10.5 8.1  RBC 2.79* 2.73* 2.62*  HGB 8.3* 8.4* 8.0*  HCT 26.8* 26.2* 25.0*  MCV 96.1 96.0 95.4  MCH 29.7 30.8 30.5  MCHC 31.0 32.1 32.0  RDW 21.1* 22.6* 22.9*  PLT 123* 119* 111*   Thyroid No results for input(s): "TSH", "FREET4" in the last 168 hours.  BNP Recent Labs  Lab 10/29/22 1433  BNP 1,049.3*    DDimer No results for input(s): "DDIMER" in the last 168 hours.   Radiology    ECHOCARDIOGRAM LIMITED  Result Date: 10/30/2022    ECHOCARDIOGRAM LIMITED REPORT   Patient Name:   EDWAR COE Date of Exam: 10/30/2022 Medical Rec #:  [id number]           Height:       65.0 in Accession #:    [id number]          Weight:       186.3 lb Date of Birth:   [birth year]           BSA:          1.919 m Patient Age:    22 years            BP:           112/64 mmHg Patient Gender: M                   HR:           88 bpm. Exam Location:  [facility] Procedure: Limited Echo, Limited Color Doppler, Cardiac Doppler and Intracardiac            Opacification Agent Indications:     I24.9 Acute ischemic heart disease, unspecified  History:         Patient has prior history of Echocardiogram examinations, most                  recent 10/01/2022. Ischemic Cardiomyopathy, CAD; Risk                  Factors:Hypertension, Dyslipidemia and Current Smoker.  Sonographer:     Charmayne Sheer Referring Phys:  [phone] CHRISTOPHER END Diagnosing Phys: Kate Sable MD  Sonographer Comments: Technically difficult study due to poor echo windows. Image acquisition challenging due to patient body habitus. IMPRESSIONS  1. Left ventricular ejection fraction, by estimation, is 55 to 60%. The left ventricle has normal function. The left ventricle has no regional wall motion abnormalities.  2. Right ventricular systolic function is normal. The right ventricular size is normal.  3. The mitral valve is normal in structure. No evidence of mitral valve regurgitation.  4. The aortic valve is normal in structure. Aortic valve regurgitation is not visualized.  5. The inferior vena cava is normal in size with greater than 50% respiratory variability, suggesting right atrial pressure of 3 mmHg. FINDINGS  Left Ventricle: Left ventricular ejection fraction, by estimation, is 55 to 60%. The left ventricle has normal function. The left ventricle has no  regional wall motion abnormalities. Definity contrast agent was given IV to delineate the left ventricular  endocardial borders. The left ventricular internal cavity size was normal in size. Right Ventricle: The right ventricular size is normal. Right ventricular systolic function is normal. Mitral Valve: The mitral valve is normal in structure. Tricuspid Valve: The tricuspid  valve is normal in structure. Tricuspid valve regurgitation is trivial. Aortic Valve: The aortic valve is normal in structure. Aortic valve regurgitation is not visualized. Aorta: The aortic root and ascending aorta are structurally normal, with no evidence of dilitation. Venous: The inferior vena cava is normal in size with greater than 50% respiratory variability, suggesting right atrial pressure of 3 mmHg. Additional Comments: Spectral Doppler performed. Color Doppler performed.  LEFT VENTRICLE PLAX 2D LVIDd:         5.40 cm LVIDs:         3.40 cm LV PW:         1.30 cm LV IVS:        1.00 cm  LEFT ATRIUM         Index LA diam:    4.90 cm 2.55 cm/m Kate Sable MD Electronically signed by Kate Sable MD Signature Date/Time: 10/30/2022/9:51:06 AM    Final    CT Angio Chest Pulmonary Embolism (PE) W or WO Contrast  Result Date: 10/29/2022 CLINICAL DATA:  Pulmonary embolism (PE) suspected, low to intermediate prob, positive D-dimer EXAM: CT ANGIOGRAPHY CHEST WITH CONTRAST TECHNIQUE: Multidetector CT imaging of the chest was performed using the standard protocol during bolus administration of intravenous contrast. Multiplanar CT image reconstructions and MIPs were obtained to evaluate the vascular anatomy. RADIATION DOSE REDUCTION: This exam was performed according to the departmental dose-optimization program which includes automated exposure control, adjustment of the mA and/or kV according to patient size and/or use of iterative reconstruction technique. CONTRAST:  69m OMNIPAQUE IOHEXOL 350 MG/ML SOLN COMPARISON:  Chest x-ray 10/29/2022, CT angio chest 09/30/2022 FINDINGS: Cardiovascular: Satisfactory opacification of the pulmonary arteries to the segmental level. No evidence of pulmonary embolism. The main pulmonary artery is enlarged measuring up to 3.6 cm. Normal heart size. No significant pericardial effusion. The thoracic aorta is normal in caliber. Mild atherosclerotic plaque of the  thoracic aorta. At least 2 vessel coronary artery calcifications. Mediastinum/Nodes: No enlarged mediastinal, hilar, or axillary lymph nodes. Thyroid gland, trachea, and esophagus demonstrate no significant findings. Lungs/Pleura: Peribronchovascular ground-glass airspace opacities scattered throughout the lungs and slightly more prominent in the upper lobes. No pulmonary nodule. No pulmonary mass. Trace bilateral pleural effusions. No pneumothorax. Upper Abdomen: Splenomegaly. Query pericholecystic fluid and gallbladder wall thickening Musculoskeletal: No chest wall abnormality. No suspicious lytic or blastic osseous lesions. No acute displaced fracture. Review of the MIP images confirms the above findings. IMPRESSION: 1. No pulmonary embolus. 2. Enlarged main pulmonary artery suggestive of pulmonary hypertension. 3. Diffuse peribronchovascular ground-glass airspace opacities suggestive of infection/inflammation. Consider repeat CT chest in 3 months to evaluate for resolution. 4. Trace bilateral pleural effusions. 5. Splenomegaly. 6. Query pericholecystic fluid and gallbladder wall thickening. Correlate with liver function-if clinically indicated consider right upper quadrant ultrasound. Electronically Signed   By: MIven FinnM.D.   On: 10/29/2022 17:15   DG Chest Port 1 View  Result Date: 10/29/2022 CLINICAL DATA:  Shortness of breath EXAM: PORTABLE CHEST 1 VIEW COMPARISON:  Chest x-ray dated October 21, 2022 FINDINGS: Unchanged cardiomegaly. New diffuse interstitial opacities. No large pleural effusion or evidence of pneumothorax. IMPRESSION: New diffuse interstitial opacities, compatible with pulmonary edema.  Electronically Signed   By: Yetta Glassman M.D.   On: 10/29/2022 11:02    Cardiac Studies   Echo Left ventricular ejection fraction, by estimation, is 55 to 60%. The  left ventricle has normal function. The left ventricle has no regional  wall motion abnormalities.   2. Right ventricular  systolic function is normal. The right ventricular  size is normal.   3. The mitral valve is normal in structure. No evidence of mitral valve  regurgitation.   4. The aortic valve is normal in structure. Aortic valve regurgitation is  not visualized.   5. The inferior vena cava is normal in size with greater than 50%  respiratory variability, suggesting right atrial pressure of 3 mmHg.   Patient Profile     51 year old male with history of CAD/PCI to LAD 2012, cocaine use, presenting with chest pain, shortness of breath, rectal bleeding secondary to hemorrhoid s/p hemorrhoidectomy, recent SMA stenting.  Being seen for elevated troponins, peaked at 1600  Assessment & Plan    1.  CAD, PCI to LAD, NSTEMI/elevated troponins -Troponins peaked at 1607, Felt secondary to supply/demand mismatch in the setting of ischemic cardiomyopathy, flash pulmonary edema, known coronary artery disease Heparin previously initiated November 14 has been held for GI bleeding HGB dropping: 8 -Continue aspirin,  -Continue Lopressor 12.5 mg twice daily, Crestor 40 -Plan for left heart catheterization on hold given continued bleeding as above   2.  Rectal bleeding, s/p hemorrhoidectomy,- Back to the operating room today given continued bleeding, surgery following   3.  Smoking, -Cessation recommended  4.  Polysubstance abuse Alcohol and cocaine cessation recommended  5. PAD  recent SMA stenting, on asa  monotherapy Management per Dr. Lucky Cowboy   Total encounter time more than 50 minutes  Greater than 50% was spent in counseling and coordination of care with the patient    For questions or updates, please contact [place] Please consult [email or web address] for contact info under        Signed, Ida Rogue, MD  10/31/2022, 9:26 AM

## 2022-10-31 NOTE — Op Note (Signed)
Procedure Date:  10/31/2022  Pre-operative Diagnosis:  Bright red blood per rectum, s/p recent hemorrhoidectomy  Post-operative Diagnosis:  Bright red blood per rectum, s/p recent hemorrhoidectomy  Procedure:  Exam under Anesthesia, suture ligation of left lateral and right anterior bleeding hemorrhoidal pedicles  Surgeon:  Melvyn Neth, MD  Anesthesia:  General endotracheal  Estimated Blood Loss:  20 ml  Specimens:  None  Complications:  None  Findings:  The patient had bleeding coming from the top of the pedicle of the left lateral internal hemorrhoid and the right anterior internal hemorrhoid.  Indications for Procedure:  This is a 51 y.o. male s/p hemorrhoidectomy 3 days ago, with persistent bleeding and large bloody bowel movement this morning.  The risks of bleeding, infection, bowel injury, and need for further procedures were all discussed with the patient and he was willing to proceed.   Description of Procedure: The patient was correctly identified in the preoperative area and brought into the operating room.  The patient was placed supine with VTE prophylaxis in place.  Appropriate time-outs were performed.  Anesthesia was induced and the patient was intubated.  Appropriate antibiotics were infused.  The patient was then placed in high lithotomy position.   The perianal area was prepped and draped in usual sterile fashion.  The sphincter was digitally dilated.  Then the anoscope was inserted and the anal canal was evaluated, revealing blood in the anal canal, with bleeding coming from the top of the pedicle of the left lateral internal hemorrhoid and right anterior internal hemorrhoid.  The bivalve retractor was then inserted, and both pedicles were suture ligated using 3-0 Vicryl.  We had good hemostasis afterwards.  The anal canal was irrigated and there was no further bleeding.  50 ml of Exparel solution was infiltrated into the perianal area and as bilateral pudendal  blocks.  A large gelfoam gauze was rolled and inserted into the anal canal for further hemostasis.  The perianal area was cleaned and dressed with fluffed gauze and mesh underwear.  The patient was emerged from anesthesia and extubated and brought to the recovery room for further management.  The patient tolerated the procedure well and all counts were correct at the end of the case.   Melvyn Neth, MD

## 2022-10-31 NOTE — Progress Notes (Addendum)
ANTICOAGULATION CONSULT NOTE  Pharmacy Consult for heparin infusion initiation and monitoring Indication: chest pain/ACS  Allergies  Allergen Reactions   Penicillins Hives    Tolerated Ceftriaxone 10/2022   Other Rash    chives    Patient Measurements: Height: '5\' 5"'$  (165.1 cm) Weight: 84.5 kg (186 lb 4.6 oz) IBW/kg (Calculated) : 61.5 Heparin Dosing Weight: 79 kg  Vital Signs: Temp: 98.2 F (36.8 C) (11/16 0400) Temp Source: Axillary (11/16 0400) BP: 113/75 (11/16 0600) Pulse Rate: 86 (11/16 0600)  Labs: Recent Labs    10/29/22 0614 10/29/22 1051 10/29/22 1204 10/29/22 1433 10/29/22 1636 10/29/22 2236 10/30/22 0543 10/30/22 1253 10/30/22 1922 10/31/22 0604  HGB 8.3*  --   --   --   --   --  8.4*  --   --  8.0*  HCT 26.8*  --   --   --   --   --  26.2*  --   --  25.0*  PLT 123*  --   --   --   --   --  119*  --   --  111*  HEPARINUNFRC  --   --   --   --   --    < > 0.27* 0.38 0.40  --   CREATININE 0.65  --   --   --   --   --  0.94  --   --  0.84  TROPONINIHS  --    < > 241* 1,607* 1,418*  --   --   --   --   --    < > = values in this interval not displayed.     Estimated Creatinine Clearance: 105.2 mL/min (by C-G formula based on SCr of 0.84 mg/dL).   Medical History: Past Medical History:  Diagnosis Date   CAD (coronary artery disease)    a. 01/2011 Anterior STEMI/Cath/PCI: LM nl, LAD 100d (3.5x68m Vision BMS placed), LCX 269mRI 50, RCA min irregs, EF 40% w/ apical, inferoapical HK.   Cardiac arrest - ventricular fibrillation    a. In setting of STEMI 01/2011   Chronic pain    [place]   Depression with anxiety    ETOH abuse    a. 6-12 beers/day   GERD (gastroesophageal reflux disease)    Hemorrhoids    Hyperlipidemia    Hypertension    Ischemic cardiomyopathy    a. 06/2011 Echo: EF 45-50%, No rwma   Marijuana abuse    Migraines    Tobacco abuse    a. 1/2 ppd x 26 yrs    Medications:  Scheduled:   aspirin EC  81 mg Oral Daily    calcium-vitamin D  1 tablet Oral BID   Chlorhexidine Gluconate Cloth  6 each Topical Daily   folic acid  1 mg Oral Daily   furosemide  40 mg Intravenous Daily   gabapentin  300 mg Oral TID   hydrocortisone   Rectal BID   ipratropium  0.5 mg Nebulization TID   ketorolac  30 mg Intravenous Q6H   magnesium oxide  400 mg Oral BID   metoprolol tartrate  12.5 mg Oral BID   multivitamin with minerals  1 tablet Oral Daily   nicotine  14 mg Transdermal Daily   pantoprazole  40 mg Oral BID   predniSONE  20 mg Oral Q breakfast   psyllium  1 packet Oral Daily   rosuvastatin  40 mg Oral Daily   thiamine  100 mg Oral Daily    Assessment: 51 y.o. male w/ PMH of PSA, CAD, anemia with bleeding internal hemorrhoids and now  second elevated cardiac enzyme. A review of medical records reveals no chronic anticoagulation prior to arrival.   Goal of Therapy:  Heparin level 0.3-0.5 units/ml Monitor platelets by anticoagulation protocol: Yes  Plan: heparin level SUBtherapeutic ---heparin was stopped prior to this level for suture of 2 bleeding pedicles  ---surgery to guide restarting heparin infusion following surgical outcome ---Continue to monitor H&H and platelets  Vallery Sa, PharmD, BCPS 10/31/2022 7:16 AM

## 2022-10-31 NOTE — Anesthesia Procedure Notes (Signed)
Procedure Name: LMA Insertion Date/Time: 10/31/2022 11:40 AM  Performed by: Tollie Eth, CRNAPre-anesthesia Checklist: Patient identified, Patient being monitored, Timeout performed, Emergency Drugs available and Suction available Patient Re-evaluated:Patient Re-evaluated prior to induction Oxygen Delivery Method: Circle system utilized Preoxygenation: Pre-oxygenation with 100% oxygen Induction Type: IV induction Ventilation: Mask ventilation without difficulty LMA: LMA with gastric port inserted LMA Size: 4.0 Tube type: Oral Number of attempts: 1 Placement Confirmation: positive ETCO2 and breath sounds checked- equal and bilateral Tube secured with: Tape Dental Injury: Teeth and Oropharynx as per pre-operative assessment

## 2022-10-31 NOTE — Progress Notes (Signed)
0450 Patient requested pain medication from RN. Upon arrival to room, the patient was attempting to get out of bed and onto the bedside commode. While assisting patient to the [facility], bright red blood was running down his legs onto the floor. Patient sat on the [facility] yelling complaining of 10/10 pain in his rectum. Bright red blood noted in the commode. Neomia Glass NP notified via secure chat [phone]. Bowel movement consisted of several moderate sized formed stools and 261m of bright red blood. KNeomia GlassNP requested at bedside to witness bowel movement. Lab notified of urgent need to obtain morning labs. Heparin gtt stopped. 10/10 pain controlled using PRN medications. See eMAR.

## 2022-10-31 NOTE — Progress Notes (Signed)
       CROSS COVER NOTE  NAME: Ethan Wells MRN: [id number] DOB : February 02, 1971 ATTENDING PHYSICIAN: Lorella Nimrod, MD    Date of Service   10/31/2022   HPI/Events of Note   Mr Overfelt put out ~239m of BRBPR with a solid bowel movement and is now reporting 10/10 rectal pain. He is s/p hemorrhoidectomy and currently on heparin gtt pending cardiac catherization.  Interventions   Assessment/Plan:  Hold Heparin Surgery notified via secure chat  CBC--> HGB 8.0 down from 8.4 yesterday Continue PRN pain meds    This document was prepared using Dragon voice recognition software and may include unintentional dictation errors.  KNeomia GlassDNP, MBA, FNP-BC Nurse Practitioner [facility] HospitalPager [phone]

## 2022-10-31 NOTE — Assessment & Plan Note (Signed)
Patient with some chest pain and shortness of breath today.  EKG shows nonspecific ST-T wave changes.  Chest x-ray consistent with heart failure.  Patient given 60 mg of IV Lasix will repeat in the afternoon.  Troponins elevated, BNP at 1049.  Recent echocardiogram with normal EF and no regional wall motion abnormalities. - Cardiology was  consultation.  Restarted aspirin.   -Continue with IV Lasix 40 mg daily -Daily weight and BMP -Strict intake and output

## 2022-10-31 NOTE — Progress Notes (Signed)
ANTICOAGULATION CONSULT NOTE  Pharmacy Consult for heparin infusion initiation and monitoring Indication: chest pain/ACS  Allergies  Allergen Reactions   Penicillins Hives    Tolerated Ceftriaxone 10/2022   Other Rash    chives    Patient Measurements: Height: '5\' 5"'$  (165.1 cm) Weight: 84.5 kg (186 lb 4.6 oz) IBW/kg (Calculated) : 61.5 Heparin Dosing Weight: 79 kg  Vital Signs: Temp: 97.8 F (36.6 C) (11/16 1400) Temp Source: Axillary (11/16 0400) BP: 106/66 (11/16 1400) Pulse Rate: 92 (11/16 1345)  Labs: Recent Labs    10/29/22 0614 10/29/22 1051 10/29/22 1204 10/29/22 1433 10/29/22 1636 10/29/22 2236 10/30/22 0543 10/30/22 1253 10/30/22 1922 10/31/22 0604  HGB 8.3*  --   --   --   --   --  8.4*  --   --  8.0*  HCT 26.8*  --   --   --   --   --  26.2*  --   --  25.0*  PLT 123*  --   --   --   --   --  119*  --   --  111*  HEPARINUNFRC  --   --   --   --   --    < > 0.27* 0.38 0.40 0.26*  CREATININE 0.65  --   --   --   --   --  0.94  --   --  0.84  TROPONINIHS  --    < > 241* 1,607* 1,418*  --   --   --   --   --    < > = values in this interval not displayed.     Estimated Creatinine Clearance: 105.2 mL/min (by C-G formula based on SCr of 0.84 mg/dL).   Medical History: Past Medical History:  Diagnosis Date   CAD (coronary artery disease)    a. 01/2011 Anterior STEMI/Cath/PCI: LM nl, LAD 100d (3.5x36m Vision BMS placed), LCX 256mRI 50, RCA min irregs, EF 40% w/ apical, inferoapical HK.   Cardiac arrest - ventricular fibrillation    a. In setting of STEMI 01/2011   Chronic pain    [place]   Depression with anxiety    ETOH abuse    a. 6-12 beers/day   GERD (gastroesophageal reflux disease)    Hemorrhoids    Hyperlipidemia    Hypertension    Ischemic cardiomyopathy    a. 06/2011 Echo: EF 45-50%, No rwma   Marijuana abuse    Migraines    Tobacco abuse    a. 1/2 ppd x 26 yrs    Medications:  Scheduled:   aspirin EC  81 mg Oral  Daily   calcium-vitamin D  1 tablet Oral BID   Chlorhexidine Gluconate Cloth  6 each Topical Daily   folic acid  1 mg Oral Daily   furosemide  40 mg Intravenous Daily   gabapentin  300 mg Oral TID   hydrocortisone   Rectal BID   HYDROmorphone       ipratropium  0.5 mg Nebulization TID   ketorolac  30 mg Intravenous Q6H   magnesium oxide  400 mg Oral BID   metoprolol tartrate  12.5 mg Oral BID   multivitamin with minerals  1 tablet Oral Daily   nicotine  14 mg Transdermal Daily   oxyCODONE       pantoprazole  40 mg Oral BID   predniSONE  20 mg Oral Q breakfast   psyllium  1 packet Oral Daily  rosuvastatin  40 mg Oral Daily   thiamine  100 mg Oral Daily    Assessment: 51 y.o. male w/ PMH of PSA, CAD, anemia with bleeding internal hemorrhoids and now  second elevated cardiac enzyme. A review of medical records reveals no chronic anticoagulation prior to arrival.   Goal of Therapy:  Heparin level 0.3-0.5 units/ml Monitor platelets by anticoagulation protocol: Yes  Plan:  ---heparin was stopped prior to this level for suture of 2 bleeding pedicles  ---restart heparin at 1400 units/hr at 1800 ---recheck heparin level 6 hours after restarting ---Continue to monitor H&H and platelets  Vallery Sa, PharmD, BCPS 10/31/2022 3:48 PM

## 2022-10-31 NOTE — Progress Notes (Signed)
[place] Hospital Day(s): 10.   Post op day(s): 3 Days Post-Op.   Interval History:  Patient seen and examined Overnight, had large bowel movement and noted to have ~200 ccs blood in this Patient reports there was blood dripping from rectum He remains without leukocytosis; 8.1K Hgb to 8.0 (Trend: 8.1 --> 8.2 --> 8.3 --> 8.4 --> 8.0) BMP reassuring  Vital signs in last 24 hours: [min-max] current  Temp:  [98 F (36.7 C)-98.2 F (36.8 C)] 98.2 F (36.8 C) (11/16 0400) Pulse Rate:  [83-100] 86 (11/16 0600) Resp:  [12-25] 18 (11/16 0600) BP: (72-122)/(47-85) 113/75 (11/16 0600) SpO2:  [94 %-100 %] 95 % (11/16 0718)     Height: '5\' 5"'$  (165.1 cm) Weight: 84.5 kg BMI (Calculated): 31   Intake/Output last 2 shifts:  11/15 0701 - 11/16 0700 In: 1047.3 [P.O.:720; I.V.:327.3] Out: 4075 [Urine:3875; Blood:200]   Physical Exam:  Constitutional: alert, cooperative and no distress  Respiratory: breathing non-labored at rest  Cardiovascular: regular rate and sinus rhythm   Labs:     Latest Ref Rng & Units 10/31/2022    6:04 AM 10/30/2022    5:43 AM 10/29/2022    6:14 AM  CBC  WBC 4.0 - 10.5 K/uL 8.1  10.5  6.3   Hemoglobin 13.0 - 17.0 g/dL 8.0  8.4  8.3   Hematocrit 39.0 - 52.0 % 25.0  26.2  26.8   Platelets 150 - 400 K/uL 111  119  123       Latest Ref Rng & Units 10/31/2022    6:04 AM 10/30/2022    5:43 AM 10/29/2022    6:14 AM  CMP  Glucose 70 - 99 mg/dL 144  131  146   BUN 6 - 20 mg/dL '20  18  11   '$ Creatinine 0.61 - 1.24 mg/dL 0.84  0.94  0.65   Sodium 135 - 145 mmol/L 140  138  139   Potassium 3.5 - 5.1 mmol/L 3.7  4.6  4.7   Chloride 98 - 111 mmol/L 108  105  110   CO2 22 - 32 mmol/L '25  26  24   '$ Calcium 8.9 - 10.3 mg/dL 8.6  8.8  8.2     Imaging studies: No new pertinent imaging studies   Assessment/Plan:  51 y.o. male with continued BRBPR 3 Days Post-Op s/p hemorrhoidectomy bleeding internal hemorrhoids and enlarged  external hemorrhoids, complicated by possible NSTEMI now requiring heparin drip    - Given significant BRBPR noted this morning, we will plan on return to the OR for EUA with Dr Kirke Corin pending OR/Anesthesia availability. Of course need for heparin in setting of NSTEMI is complicating factor.   - All risks, benefits, and alternatives to above procedure(s) were discussed with the patient, all of his questions were answered to his expressed satisfaction, patient expresses he wishes to proceed, and informed consent was obtained.   - NPO for surgery  - Pain control prn              - Good hygiene following bowel movements              - Further management per primary service; will follow   All of the above findings and recommendations were discussed with the patient, and the medical team, and all of patient's questions were answered to his expressed satisfaction.  -- Edison Simon, PA-C [facility] 10/31/2022, 7:32 AM M-F: 7am - 4pm

## 2022-10-31 NOTE — Progress Notes (Addendum)
0718 Patient medicated several times for rectal pain. Rectum examined- no bleeding or clotts. CHG bath given pre surgery. 1100 Over to surgery. Escorted per CRNA and OR nurses. 1430 Back from OR. Awake and hateful. Received pain medication as much as possible. Mesh panties in place. No increase in drainage. Remains in SR

## 2022-10-31 NOTE — Assessment & Plan Note (Addendum)
 Cardiac enzymes negative on admission.  Patient had a recent stress test that was a low risk scan.  Test was consistent with prior myocardial infarction.  No ischemia seen.  Repeat chest pain  which also resulted in elevated troponin which peaked at 1600, concern of NSTEMI.  He was started on heparin infusion but it was discontinued again today due to concern of bleeding.  We will avoid heparin at this time.  No chest pain -Cardiac catheterization with new stenosis of mid left circumflex and mid RCA, no intervention was done and it will be planned later as an outpatient once GI bleeding issue completely resolved. Cardiology is recommending medical management -Continue with supportive care -Appreciate cardiology help

## 2022-10-31 NOTE — Transfer of Care (Signed)
Immediate Anesthesia Transfer of Care Note  [name]  Procedure(s) Performed: EXAM UNDER ANESTHESIA WITH SUTURE LIGATION OF TWO BLEEDING PEDICLES (Rectum)  Patient Location: PACU  Anesthesia Type:General  Level of Consciousness: drowsy  Airway & Oxygen Therapy: Patient Spontanous Breathing and Patient connected to face mask oxygen  Post-op Assessment: Report given to RN and Post -op Vital signs reviewed and stable  Post vital signs: Reviewed and stable  Last Vitals:  Vitals Value Taken Time  BP 124/84 10/31/22 1257  Temp    Pulse 90 10/31/22 1300  Resp 14 10/31/22 1300  SpO2 100 % 10/31/22 1300  Vitals shown include unvalidated device data.  Last Pain:  Vitals:   10/31/22 1115  TempSrc:   PainSc: Asleep      Patients Stated Pain Goal: 5 (61/16/43 5391)  Complications: No notable events documented.

## 2022-10-31 NOTE — Assessment & Plan Note (Signed)
Resolved

## 2022-10-31 NOTE — Brief Op Note (Signed)
10/31/2022  12:52 PM  PATIENT:  Ethan Wells  51 y.o. male  PRE-OPERATIVE DIAGNOSIS:  Rectal Bleeding  POST-OPERATIVE DIAGNOSIS:  Rectal Bleeding  PROCEDURE:  Procedure(s): EXAM UNDER ANESTHESIA WITH SUTURE LIGATION OF TWO BLEEDING PEDICLES (N/A)  SURGEON:  Surgeon(s) and Role:    * Carey Johndrow, MD - Primary  ANESTHESIA:   general  EBL:  20 ml  BLOOD ADMINISTERED:none  DRAINS: none   LOCAL MEDICATIONS USED:  BUPIVICAINE   SPECIMEN:  No Specimen  DISPOSITION OF SPECIMEN:  N/A  COUNTS:  YES  DICTATION: .Dragon Dictation  PLAN OF CARE:  Return to ICU  PATIENT DISPOSITION:  PACU - hemodynamically stable.   Delay start of Pharmacological VTE agent (>24hrs) due to surgical blood loss or risk of bleeding: yes

## 2022-10-31 NOTE — Progress Notes (Signed)
Progress Note   [name]:[id number] DOB: [birth year] DOA: 10/21/2022     10 DOS: the patient was seen and examined on 10/31/2022   Brief hospital course: Ethan Wells is a 51 y.o. male with medical history significant of polysubstance abuse, coronary artery disease, anemia due to hemmoroids, QT prolongation chronic pain syndrome, who presents to the ER today on account of generalized body aches and pains.  At the ER he was found to have potassium of 2.2 subsequently started on IV potassium.  Patient is a poor historian. During my encounter patient was complaining of pain in his arm due to the IV potassium. He endorses recent use of cocaine and alcohol.   Labs concerning for hyponatremia sodium 133, potassium 2.2, magnesium 1.7, hemoglobin 8.8.   Chest x-ray shows no pneumonia, pneumothorax or pleural effusion.   EKG shows no acute ST-T wave changes, first 2 sets of high-sensitivity troponins were 13, 13.  Patient was started on a trial of Solu-Medrol to see if this helps out his pains.  ANA negative. Rheumatoid factor slightly elevated.  Dr. Lucky Wells did a angiogram and placed a stent in the superior mesenteric artery.  Patient's epigastric pain improved after this.  Patient on aspirin.  Patient complaining of rectal bleeding.  Reviewed recent colonoscopy and flexible sigmoidoscopy and the patient does have some bleeding hemorrhoids.  Anusol ordered.  Dr. Hampton Wells did a hemorrhoidal procedure on 10/28/2022.  On 10/29/2022, the patient had some chest pain and then developed shortness of breath.  EKGs were done which showed nonspecific changes.  Troponins pending.  Chest x-ray looks like pulmonary vascular congestion and 60 mg of Lasix was given.  The patient was started on BiPAP and transferred to the stepdown unit.  We will give another dose of IV Lasix this evening.  Reviewed recent echocardiogram with an EF of 60%.  Case discussed with Dr. Saunders Wells cardiology.  Aspirin  restarted.  11/15: Hemodynamically stable on 2 L this morning.  Troponin peaked at 1607, CTA was done for concern of PE and it was negative for PE, did have some finding consistent with pulmonary hypertension and bilateral groundglass opacities with mild pleural effusion. He was placed on heparin infusion, most likely will need cardiac cath based on his cardiac history but cardiology would like to give him a DAPT challenge before due to recent GI bleed. Hemoglobin currently stable around 8.4.  BNP elevated at 1049. Patient continued to have mild intermittent hematochezia, per surgery it is common after hemorrhoidectomy.  11/16: Hemodynamically stable, patient had bright red blood per rectum was using commode for BM earlier this morning, proximately 200 mL of blood seen by nursing staff, also had 10 out of 10 rectal pain.  Surgical site probably patient was made n.p.o. as they will take him back to the OR for examination under anesthesia.  Hemoglobin dropped to from 8.4 yesterday. He was found to have 2 bleeding pedicles which were ligated by general surgery in the OR.  Assessment and Plan: * Acute on chronic diastolic CHF (congestive heart failure) ([place]) Patient with some chest pain and shortness of breath today.  EKG shows nonspecific ST-T wave changes.  Chest x-ray consistent with heart failure.  Patient given 60 mg of IV Lasix will repeat in the afternoon.  Troponins elevated, BNP at 1049.  Recent echocardiogram with normal EF and no regional wall motion abnormalities. - Cardiology was  consultation.  Restarted aspirin.   -Continue with IV Lasix 40 mg daily -Daily weight  and BMP -Strict intake and output  Acute respiratory distress Secondary to heart failure.  Requiring BiPAP overnight, not transition back to baseline oxygen requirement of 2 L. -Continue supplemental oxygen -Continue with BiPAP as needed  Hemorrhoids, internal, with bleeding Dr. Hampton Wells took to the operating room for  hemorrhoid procedure.  Patient had another significant bleed this morning and was taken to the OR again and found to have 2 bleeding pedicles which were ligated. -Monitor hemoglobin -Transfuse if below 7 -Hold heparin for at least 6 hours per surgery  Mesenteric artery stenosis (HCC) Mesenteric ischemia.  Dr. Lucky Wells placed a stent in the SMA on 10/24/2022.  Restarted aspirin.   Arterial ultrasound negative for pseudoaneurysm.  NSTEMI (non-ST elevated myocardial infarction) [facility]) Cardiac enzymes negative on admission.  Patient had a recent stress test that was a low risk scan.  Test was consistent with prior myocardial infarction.  No ischemia seen.  Repeat chest pain yesterday which also resulted in elevated troponin which peaked at 1600, concern of NSTEMI.  He was started on heparin infusion. Patient will need cardiac catheterization per cardiology but they would like to see stable hemoglobin and no more bleeding on heparin. Patient had another significant bleed this morning and heparin infusion was placed on hold and will need to be keep holding 6 hours after the new procedure -Continue with supportive care -Appreciate cardiology help  Epigastric pain Seems to improved after placing a stent in the SMA.  Also reviewed endoscopy from 05/30/2022 showing esophagitis and portal hypertensive gastropathy and duodenitis.  We will continue empiric PPI.  CAD (coronary artery disease) On Crestor, restarted aspirin.  Hyperkalemia Resolved.  Elevated rheumatoid factor Will refer to rheumatology as outpatient.  Patient not the best candidate for methotrexate with history of drinking.  We will continue low-dose steroids for now.  Iron deficiency anemia Last hemoglobin 8.3.  IV iron was given in the hospital 10/25/2022 and 10/27/22.  Prolonged QT interval Monitoring on telemetry  Alcohol dependence ([place]) No signs of withdrawal.  Continue folate thiamine.   Subjective: Patient was complaining of 10  out of 10 rectal pain and waiting for his procedure when seen today.  Did not had any more bowel movement after that incidence earlier in the day.  Physical Exam: Vitals:   10/31/22 1327 10/31/22 1330 10/31/22 1345 10/31/22 1400  BP:  (!) 100/58 109/67 106/66  Pulse: 94 92 92   Resp: (!) '23 15 15   '$ Temp:  97.8 F (36.6 C)  97.8 F (36.6 C)  TempSrc:      SpO2: 95% 92% 93%   Weight:      Height:       General.  [place], in no acute distress. Pulmonary.  Lungs clear bilaterally, normal respiratory effort. CV.  Regular rate and rhythm, no JVD, rub or murmur. Abdomen.  Soft, nontender, nondistended, BS positive. CNS.  Alert and oriented .  No focal neurologic deficit. Extremities.  No edema, no cyanosis, pulses intact and symmetrical. Psychiatry.  Judgment and insight appears normal.   Data Reviewed: Prior data reviewed  Family Communication: Discussed with patient  Disposition: Status is: Inpatient Remains inpatient appropriate because: Severity of illness  Planned Discharge Destination: Home  Prophylaxis.  Heparin fusion Time spent: 50 minutes  This record has been created using Systems analyst. Errors have been sought and corrected,but may not always be located. Such creation errors do not reflect on the standard of care.   Author: Lorella Nimrod, MD 10/31/2022 2:40 PM  For on call review [email or web address].

## 2022-11-01 ENCOUNTER — Encounter: Payer: Self-pay | Admitting: Surgery

## 2022-11-01 DIAGNOSIS — I214 Non-ST elevation (NSTEMI) myocardial infarction: Secondary | ICD-10-CM | POA: Diagnosis not present

## 2022-11-01 DIAGNOSIS — K551 Chronic vascular disorders of intestine: Secondary | ICD-10-CM | POA: Diagnosis not present

## 2022-11-01 DIAGNOSIS — I5033 Acute on chronic diastolic (congestive) heart failure: Secondary | ICD-10-CM | POA: Diagnosis not present

## 2022-11-01 DIAGNOSIS — R0603 Acute respiratory distress: Secondary | ICD-10-CM | POA: Diagnosis not present

## 2022-11-01 LAB — BASIC METABOLIC PANEL
Anion gap: 10 (ref 5–15)
BUN: 16 mg/dL (ref 6–20)
CO2: 23 mmol/L (ref 22–32)
Calcium: 8.5 mg/dL — ABNORMAL LOW (ref 8.9–10.3)
Chloride: 102 mmol/L (ref 98–111)
Creatinine, Ser: 0.69 mg/dL (ref 0.61–1.24)
GFR, Estimated: >60 mL/min (ref >60)
Glucose, Bld: 174 mg/dL — ABNORMAL HIGH (ref 70–99)
Potassium: 4 mmol/L (ref 3.5–5.1)
Sodium: 135 mmol/L (ref 135–145)

## 2022-11-01 LAB — CBC
HCT: 24.4 % — ABNORMAL LOW (ref 39.0–52.0)
HCT: 24.5 % — ABNORMAL LOW (ref 39.0–52.0)
Hemoglobin: 7.7 g/dL — ABNORMAL LOW (ref 13.0–17.0)
Hemoglobin: 7.9 g/dL — ABNORMAL LOW (ref 13.0–17.0)
MCH: 30.4 pg (ref 26.0–34.0)
MCH: 31 pg (ref 26.0–34.0)
MCHC: 31.6 g/dL (ref 30.0–36.0)
MCHC: 32.2 g/dL (ref 30.0–36.0)
MCV: 96.4 fL (ref 80.0–100.0)
MCV: 96.1 fL (ref 80.0–100.0)
Platelets: 95 10*3/uL — ABNORMAL LOW (ref 150–400)
Platelets: 107 10*3/uL — ABNORMAL LOW (ref 150–400)
RBC: 2.53 MIL/uL — ABNORMAL LOW (ref 4.22–5.81)
RBC: 2.55 MIL/uL — ABNORMAL LOW (ref 4.22–5.81)
RDW: 22.5 % — ABNORMAL HIGH (ref 11.5–15.5)
RDW: 22.6 % — ABNORMAL HIGH (ref 11.5–15.5)
WBC: 9.7 10*3/uL (ref 4.0–10.5)
WBC: 10.9 10*3/uL — ABNORMAL HIGH (ref 4.0–10.5)
nRBC: 0 % (ref 0.0–0.2)
nRBC: 0 % (ref 0.0–0.2)

## 2022-11-01 LAB — HEPARIN LEVEL (UNFRACTIONATED)
Heparin Unfractionated: 0.24 IU/mL — ABNORMAL LOW (ref 0.30–0.70)
Heparin Unfractionated: 0.25 IU/mL — ABNORMAL LOW (ref 0.30–0.70)
Heparin Unfractionated: 0.4 IU/mL (ref 0.30–0.70)

## 2022-11-01 LAB — PREPARE RBC (CROSSMATCH)

## 2022-11-01 MED ORDER — SODIUM CHLORIDE 0.9% FLUSH
3.0000 mL | Freq: Two times a day (BID) | INTRAVENOUS | Status: DC
  Administered 2022-11-01 – 2022-11-03 (×6): 3 mL via INTRAVENOUS

## 2022-11-01 MED ORDER — SODIUM CHLORIDE 0.9% IV SOLUTION: Freq: Once | INTRAVENOUS | Status: AC

## 2022-11-01 MED ORDER — IPRATROPIUM BROMIDE 0.02 % IN SOLN: 0.5000 mg | Freq: Four times a day (QID) | RESPIRATORY_TRACT | Status: DC | PRN

## 2022-11-01 NOTE — Progress Notes (Addendum)
ANTICOAGULATION CONSULT NOTE  Pharmacy Consult for heparin infusion initiation and monitoring Indication: chest pain/ACS  Allergies  Allergen Reactions   Penicillins Hives    Tolerated Ceftriaxone 10/2022   Other Rash    chives    Patient Measurements: Height: '5\' 5"'$  (165.1 cm) Weight: 89.6 kg (197 lb 8.5 oz) IBW/kg (Calculated) : 61.5 Heparin Dosing Weight: 79 kg  Vital Signs: Temp: 98.1 F (36.7 C) (11/17 1216) BP: 109/64 (11/17 1216) Pulse Rate: 82 (11/17 1216)  Labs: Recent Labs    10/29/22 1433 10/29/22 1636 10/29/22 2236 10/30/22 0543 10/30/22 1253 10/31/22 0604 10/31/22 1654 11/01/22 0106 11/01/22 0904 11/01/22 1133  HGB  --   --    < > 8.4*  --  8.0* 8.3*  --  7.7*  --   HCT  --   --    < > 26.2*  --  25.0* 26.1*  --  24.4*  --   PLT  --   --    < > 119*  --  111* 103*  --  95*  --   HEPARINUNFRC  --   --    < > 0.27*   < > 0.26*  --  0.24* 0.25* 0.40  CREATININE  --   --   --  0.94  --  0.84  --   --  0.69  --   TROPONINIHS 1,607* 1,418*  --   --   --   --   --   --   --   --    < > = values in this interval not displayed.     Estimated Creatinine Clearance: 113.6 mL/min (by C-G formula based on SCr of 0.69 mg/dL).   Medical History: Past Medical History:  Diagnosis Date   CAD (coronary artery disease)    a. 01/2011 Anterior STEMI/Cath/PCI: LM nl, LAD 100d (3.5x7m Vision BMS placed), LCX 255mRI 50, RCA min irregs, EF 40% w/ apical, inferoapical HK.   Cardiac arrest - ventricular fibrillation    a. In setting of STEMI 01/2011   Chronic pain    [place]   Depression with anxiety    ETOH abuse    a. 6-12 Bettyjo Lundblad/day   GERD (gastroesophageal reflux disease)    Hemorrhoids    Hyperlipidemia    Hypertension    Ischemic cardiomyopathy    a. 06/2011 Echo: EF 45-50%, No rwma   Marijuana abuse    Migraines    Tobacco abuse    a. 1/2 ppd x 26 yrs   Medications:  Scheduled:   sodium chloride   Intravenous Once   aspirin EC  81 mg Oral  Daily   calcium-vitamin D  1 tablet Oral BID   Chlorhexidine Gluconate Cloth  6 each Topical Daily   folic acid  1 mg Oral Daily   furosemide  40 mg Intravenous Daily   gabapentin  300 mg Oral TID   hydrocortisone   Rectal BID   ketorolac  30 mg Intravenous Q6H   magnesium oxide  400 mg Oral BID   metoprolol tartrate  12.5 mg Oral BID   multivitamin with minerals  1 tablet Oral Daily   nicotine  14 mg Transdermal Daily   pantoprazole  40 mg Oral BID   polyethylene glycol  17 g Oral Daily   predniSONE  20 mg Oral Q breakfast   psyllium  1 packet Oral Daily   rosuvastatin  40 mg Oral Daily   sodium chloride flush  3 mL Intravenous Q12H   thiamine  100 mg Oral Daily  Heparin Dosing Weight: 79 kg  Assessment: 51 y.o. male w/ PMH of PSA, CAD, anemia with bleeding internal hemorrhoids and now  second elevated cardiac enzyme. A review of medical records reveals no chronic anticoagulation prior to arrival.   11/09 - Mesenteric ischemia >> stented SMA.  Restarted aspirin.  11/13 - s/p EUA and hemorrhoidectomy Surgery consulted to evaluate  11/16 - heparin gtt stopped for further rectal bleeding, back to OR for further repair of hemorrhoidectomy site.  11/17 - LHC on-hold d/t continued bleeding. Continue heparin gtt for now through 11/20 with lowered goal and no bolus.  Goal of Therapy:  Heparin level 0.3-0.5 units/ml (NO BOLUS) Monitor platelets by anticoagulation protocol: Yes  Date Time  HL Rate/Comment 11/17 0106  0.24 subtherapeutic 11/17 1133   0.40 thera x1; 1500 un/h    ADDENDUM: HGB - 8.4>8>7.7>__ Heparin gtt HELD on 11/17 afternoon d/t re-bleed. Pharmacy will remove consult at this time. Please re-consult as needed when appropriate to resume heparin.  Plan:  Continue to monitor H&H and platelets BID during heightened risk period.  Lorna Dibble, PharmD, [facility] Clinical Pharmacist 11/01/2022 2:35 PM

## 2022-11-01 NOTE — Plan of Care (Signed)

## 2022-11-01 NOTE — TOC Progression Note (Signed)
Transition of Care [facility]) - Progression Note    Patient Details  Name: Ethan Wells MRN: [id number] Date of Birth: 02-16-1971  Transition of Care [facility]) CM/SW [place], LCSW Phone Number: 11/01/2022, 8:47 AM  Clinical Narrative:  TOC continues to follow for disposition needs.   Expected Discharge Plan:  [facility]) Barriers to Discharge: Continued Medical Work up  Expected Discharge Plan and Services Expected Discharge Plan:  [facility])     Post Acute Care Choice: NA Living arrangements for the past 2 months: Mobile Home, Hotel/Motel                                       Social Determinants of Health (SDOH) Interventions Food Insecurity Interventions: Other (Comment), Inpatient TOC (Gave information for food bank.) Housing Interventions: Inpatient TOC, Other (Comment) (Gave resources for shelter and housing authorities.)  Readmission Risk Interventions    10/22/2022   10:47 AM  Readmission Risk Prevention Plan  Transportation Screening Complete  PCP or Specialist Appt within 3-5 Days Complete  Social Work Consult for [place] Planning/Counseling Complete  Palliative Care Screening Not Applicable  Medication Review [employer]) Complete

## 2022-11-01 NOTE — Progress Notes (Signed)
Patient started to have rectal bleeding again. Dr. Reesa Chew notified, ordered to hold Heparin gtt.

## 2022-11-01 NOTE — Assessment & Plan Note (Signed)
Resolved

## 2022-11-01 NOTE — Progress Notes (Signed)
Rounding Note    Patient Name: Ethan Wells Date of Encounter: 11/01/2022  [place] Cardiologist: Lauree Chandler, MD   Subjective   Hemorrhoid procedure November 13 Unfortunately continue to have bloody bowel movements Went back yesterday for ligation of hemorrhoids Still with rectal pain Denies chest pain or shortness of breath at rest supine in bed  Inpatient Medications    Scheduled Meds:  aspirin EC  81 mg Oral Daily   calcium-vitamin D  1 tablet Oral BID   Chlorhexidine Gluconate Cloth  6 each Topical Daily   folic acid  1 mg Oral Daily   furosemide  40 mg Intravenous Daily   gabapentin  300 mg Oral TID   hydrocortisone   Rectal BID   ipratropium  0.5 mg Nebulization TID   ketorolac  30 mg Intravenous Q6H   magnesium oxide  400 mg Oral BID   metoprolol tartrate  12.5 mg Oral BID   multivitamin with minerals  1 tablet Oral Daily   nicotine  14 mg Transdermal Daily   pantoprazole  40 mg Oral BID   polyethylene glycol  17 g Oral Daily   predniSONE  20 mg Oral Q breakfast   psyllium  1 packet Oral Daily   rosuvastatin  40 mg Oral Daily   sodium chloride flush  3 mL Intravenous Q12H   thiamine  100 mg Oral Daily   Continuous Infusions:  heparin 1,500 Units/hr (11/01/22 1001)   PRN Meds: acetaminophen, HYDROmorphone (DILAUDID) injection, methocarbamol, nitroGLYCERIN, oxyCODONE   Vital Signs    Vitals:   10/31/22 2354 11/01/22 0355 11/01/22 0719 11/01/22 0833  BP: 113/72 112/74  126/73  Pulse: 86 83  79  Resp: '18 18  18  '$ Temp: 97.7 F (36.5 C) 98 F (36.7 C)  98.3 F (36.8 C)  TempSrc: Oral     SpO2: 100% 98%  97%  Weight:   89.6 kg   Height:        Intake/Output Summary (Last 24 hours) at 11/01/2022 1212 Last data filed at 11/01/2022 0800 Gross per 24 hour  Intake 1527.3 ml  Output 2805 ml  Net -1277.7 ml      11/01/2022    7:19 AM 10/22/2022    2:52 AM 10/21/2022    7:52 PM  Last 3 Weights  Weight (lbs) 197 lb  8.5 oz 186 lb 4.6 oz 180 lb 6.4 oz  Weight (kg) 89.6 kg 84.5 kg 81.829 kg      Telemetry    Normal sinus rhythm- Personally Reviewed  ECG     - Personally Reviewed  Physical Exam   Constitutional:  oriented to person, place, and time. No distress.  HENT:  Head: Grossly normal Eyes:  no discharge. No scleral icterus.  Neck: No JVD, no carotid bruits  Cardiovascular: Regular rate and rhythm, no murmurs appreciated Pulmonary/Chest: Clear to auscultation bilaterally, no wheezes or rails Abdominal: Soft.  no distension.  no tenderness.  Musculoskeletal: Normal range of motion Neurological:  normal muscle tone. Coordination normal. No atrophy Skin: Skin warm and dry Psychiatric: normal affect, pleasant  Labs    High Sensitivity Troponin:   Recent Labs  Lab 10/21/22 2131 10/29/22 1051 10/29/22 1204 10/29/22 1433 10/29/22 1636  TROPONINIHS 13 15 241* 1,607* 1,418*     Chemistry Recent Labs  Lab 10/30/22 0543 10/31/22 0604 11/01/22 0904  NA 138 140 135  K 4.6 3.7 4.0  CL 105 108 102  CO2 '26 25 23  '$ GLUCOSE 131*  144* 174*  BUN '18 20 16  '$ CREATININE 0.94 0.84 0.69  CALCIUM 8.8* 8.6* 8.5*  GFRNONAA >60 >60 >60  ANIONGAP '7 7 10    '$ Lipids No results for input(s): "CHOL", "TRIG", "HDL", "LABVLDL", "LDLCALC", "CHOLHDL" in the last 168 hours.  Hematology Recent Labs  Lab 10/31/22 0604 10/31/22 1654 11/01/22 0904  WBC 8.1 8.9 9.7  RBC 2.62* 2.71* 2.53*  HGB 8.0* 8.3* 7.7*  HCT 25.0* 26.1* 24.4*  MCV 95.4 96.3 96.4  MCH 30.5 30.6 30.4  MCHC 32.0 31.8 31.6  RDW 22.9* 22.7* 22.5*  PLT 111* 103* 95*   Thyroid No results for input(s): "TSH", "FREET4" in the last 168 hours.  BNP Recent Labs  Lab 10/29/22 1433  BNP 1,049.3*    DDimer No results for input(s): "DDIMER" in the last 168 hours.   Radiology    No results found.  Cardiac Studies   Echo October 30, 2022 Left ventricular ejection fraction, by estimation, is 55 to 60%. The  left ventricle has  normal function. The left ventricle has no regional  wall motion abnormalities.   2. Right ventricular systolic function is normal. The right ventricular  size is normal.   3. The mitral valve is normal in structure. No evidence of mitral valve  regurgitation.   4. The aortic valve is normal in structure. Aortic valve regurgitation is  not visualized.   5. The inferior vena cava is normal in size with greater than 50%  respiratory variability, suggesting right atrial pressure of 3 mmHg.   Patient Profile     51 year old male with history of CAD/PCI to LAD 2012, cocaine use, presenting with chest pain, shortness of breath, rectal bleeding secondary to hemorrhoid s/p hemorrhoidectomy, recent SMA stenting.  Being seen for elevated troponins, peaked at 1600  Assessment & Plan    1.  CAD, PCI to LAD, NSTEMI/elevated troponins -Troponins peaked at 1607, Felt secondary to supply/demand mismatch in the setting of ischemic cardiomyopathy, flash pulmonary edema, known coronary artery disease Heparin previously initiated November 14 , held for GI bleeding Appears heparin has been restarted following ligation of hemorrhoids yesterday HGB dropping: 7.7 Consider transfusion -Continue aspirin,  -Continue Lopressor 12.5 mg twice daily, Crestor 40 -Plan for left heart catheterization on Monday if hemoglobin stabilizes   2.  Rectal bleeding, s/p hemorrhoidectomy,- Back to the operating room yesterday for hemorrhoid ligation given continued bleeding, surgery following Still with rectal pain   3.  Smoking, -Cessation recommended  4.  Polysubstance abuse Alcohol and cocaine cessation recommended  5. PAD  recent SMA stenting, on asa  monotherapy Management per Dr. Lucky Cowboy   Total encounter time more than 50 minutes  Greater than 50% was spent in counseling and coordination of care with the patient    For questions or updates, please contact [place] Please consult [email or web address] for  contact info under        Signed, Ida Rogue, MD  11/01/2022, 12:12 PM

## 2022-11-01 NOTE — Progress Notes (Signed)
1 unit of blood infusing. No sign of transfusion reaction noted a this time.

## 2022-11-01 NOTE — Progress Notes (Signed)
ANTICOAGULATION CONSULT NOTE  Pharmacy Consult for heparin infusion initiation and monitoring Indication: chest pain/ACS  Allergies  Allergen Reactions   Penicillins Hives    Tolerated Ceftriaxone 10/2022   Other Rash    chives    Patient Measurements: Height: '5\' 5"'$  (165.1 cm) Weight: 84.5 kg (186 lb 4.6 oz) IBW/kg (Calculated) : 61.5 Heparin Dosing Weight: 79 kg  Vital Signs: Temp: 97.7 F (36.5 C) (11/16 2354) Temp Source: Oral (11/16 2354) BP: 113/72 (11/16 2354) Pulse Rate: 86 (11/16 2354)  Labs: Recent Labs    10/29/22 0614 10/29/22 1051 10/29/22 1204 10/29/22 1433 10/29/22 1636 10/29/22 2236 10/30/22 0543 10/30/22 1253 10/30/22 1922 10/31/22 0604 10/31/22 1654 11/01/22 0106  HGB 8.3*  --   --   --   --   --  8.4*  --   --  8.0* 8.3*  --   HCT 26.8*  --   --   --   --   --  26.2*  --   --  25.0* 26.1*  --   PLT 123*  --   --   --   --   --  119*  --   --  111* 103*  --   HEPARINUNFRC  --   --   --   --   --    < > 0.27*   < > 0.40 0.26*  --  0.24*  CREATININE 0.65  --   --   --   --   --  0.94  --   --  0.84  --   --   TROPONINIHS  --    < > 241* 1,607* 1,418*  --   --   --   --   --   --   --    < > = values in this interval not displayed.     Estimated Creatinine Clearance: 105.2 mL/min (by C-G formula based on SCr of 0.84 mg/dL).   Medical History: Past Medical History:  Diagnosis Date   CAD (coronary artery disease)    a. 01/2011 Anterior STEMI/Cath/PCI: LM nl, LAD 100d (3.5x73m Vision BMS placed), LCX 259mRI 50, RCA min irregs, EF 40% w/ apical, inferoapical HK.   Cardiac arrest - ventricular fibrillation    a. In setting of STEMI 01/2011   Chronic pain    [place]   Depression with anxiety    ETOH abuse    a. 6-12 beers/day   GERD (gastroesophageal reflux disease)    Hemorrhoids    Hyperlipidemia    Hypertension    Ischemic cardiomyopathy    a. 06/2011 Echo: EF 45-50%, No rwma   Marijuana abuse    Migraines    Tobacco  abuse    a. 1/2 ppd x 26 yrs    Medications:  Scheduled:   aspirin EC  81 mg Oral Daily   calcium-vitamin D  1 tablet Oral BID   Chlorhexidine Gluconate Cloth  6 each Topical Daily   folic acid  1 mg Oral Daily   furosemide  40 mg Intravenous Daily   gabapentin  300 mg Oral TID   hydrocortisone   Rectal BID   ipratropium  0.5 mg Nebulization TID   ketorolac  30 mg Intravenous Q6H   magnesium oxide  400 mg Oral BID   metoprolol tartrate  12.5 mg Oral BID   multivitamin with minerals  1 tablet Oral Daily   nicotine  14 mg Transdermal Daily   pantoprazole  40 mg Oral BID   polyethylene glycol  17 g Oral Daily   predniSONE  20 mg Oral Q breakfast   psyllium  1 packet Oral Daily   rosuvastatin  40 mg Oral Daily   thiamine  100 mg Oral Daily    Assessment: 51 y.o. male w/ PMH of PSA, CAD, anemia with bleeding internal hemorrhoids and now  second elevated cardiac enzyme. A review of medical records reveals no chronic anticoagulation prior to arrival.   Goal of Therapy:  Heparin level 0.3-0.5 units/ml Monitor platelets by anticoagulation protocol: Yes  11/17 0106 HL 0.24, subtherapeutic  Plan:  ---Increase heparin infusion to 1500 units/hr ---recheck heparin level 6 hours after rate change ---Continue to monitor H&H and platelets  Renda Rolls, PharmD, [facility] 11/01/2022 2:31 AM

## 2022-11-01 NOTE — Anesthesia Postprocedure Evaluation (Signed)
Anesthesia Post Note  [name]  Procedure(s) Performed: EXAM UNDER ANESTHESIA WITH SUTURE LIGATION OF TWO BLEEDING PEDICLES (Rectum)  Patient location during evaluation: PACU Anesthesia Type: General Level of consciousness: awake and alert Pain management: pain level controlled Vital Signs Assessment: post-procedure vital signs reviewed and stable Respiratory status: spontaneous breathing, nonlabored ventilation, respiratory function stable and patient connected to nasal cannula oxygen Cardiovascular status: blood pressure returned to baseline and stable Postop Assessment: no apparent nausea or vomiting Anesthetic complications: no   No notable events documented.   Last Vitals:  Vitals:   10/31/22 2354 11/01/22 0355  BP: 113/72 112/74  Pulse: 86 83  Resp: 18 18  Temp: 36.5 C 36.7 C  SpO2: 100% 98%    Last Pain:  Vitals:   11/01/22 0439  TempSrc:   PainSc: Asleep                 Ilene Qua

## 2022-11-01 NOTE — Progress Notes (Signed)
[place] [facility] Day(s): 11.   Post op day(s): 1 Day Post-Op.   Interval History:  Patient seen and examined No acute events or new complaints overnight.  Patient reports he continues to have significant rectal pain No further bleeding Hgb improved with post-op labs yesterday; CBC pending this AM BMP is reassuring  Vital signs in last 24 hours: [min-max] current  Temp:  [97.6 F (36.4 C)-98.3 F (36.8 C)] 98.3 F (36.8 C) (11/17 0833) Pulse Rate:  [79-96] 79 (11/17 0833) Resp:  [14-23] 18 (11/17 0833) BP: (100-131)/(58-85) 126/73 (11/17 0833) SpO2:  [91 %-100 %] 97 % (11/17 0833) Weight:  [89.6 kg] 89.6 kg (11/17 0719)     Height: '5\' 5"'$  (165.1 cm) Weight: 89.6 kg BMI (Calculated): 32.87   Intake/Output last 2 shifts:  11/16 0701 - 11/17 0700 In: 1287.3 [P.O.:740; I.V.:547.3] Out: 4105 [Urine:4085; Blood:20]   Physical Exam:  Constitutional: alert, cooperative and no distress  Respiratory: breathing non-labored at rest  Cardiovascular: regular rate and sinus rhythm  Genitourinary: Chaperone present, fluffed gauzed only mildly saturated, no gross blood, no active bleeding   Labs:     Latest Ref Rng & Units 10/31/2022    4:54 PM 10/31/2022    6:04 AM 10/30/2022    5:43 AM  CBC  WBC 4.0 - 10.5 K/uL 8.9  8.1  10.5   Hemoglobin 13.0 - 17.0 g/dL 8.3  8.0  8.4   Hematocrit 39.0 - 52.0 % 26.1  25.0  26.2   Platelets 150 - 400 K/uL 103  111  119       Latest Ref Rng & Units 11/01/2022    9:04 AM 10/31/2022    6:04 AM 10/30/2022    5:43 AM  CMP  Glucose 70 - 99 mg/dL 174  144  131   BUN 6 - 20 mg/dL '16  20  18   '$ Creatinine 0.61 - 1.24 mg/dL 0.69  0.84  0.94   Sodium 135 - 145 mmol/L 135  140  138   Potassium 3.5 - 5.1 mmol/L 4.0  3.7  4.6   Chloride 98 - 111 mmol/L 102  108  105   CO2 22 - 32 mmol/L '23  25  26   '$ Calcium 8.9 - 10.3 mg/dL 8.5  8.6  8.8      Imaging studies: No new pertinent imaging  studies   Assessment/Plan:  51 y.o. male  1 Day Post-Op s/p repeat EUA and suture ligation of bleed vessels following initial hemorrhoidectomy on 11/13    - No further signs of active bleeding. Anticipate some degree of blood in stool with first BM. Monitor for return of significant bleeding. Again, complicated by need for anticoagulation in setting of NSTEMI             - Pain control prn              - Good hygiene following bowel movements              - Further management per primary service   - Nothing further from surgery perspective. We will remain available should anything change. Follow up with Dr Kirke Corin in ~2 weeks once medically stable to discharge.      All of the above findings and recommendations were discussed with the patient, and the medical team, and all of patient's questions were answered to his expressed satisfaction.  -- Edison Simon, PA-C [name] 11/01/2022, 9:36 AM M-F: [facility] -  4pm

## 2022-11-01 NOTE — Progress Notes (Signed)
Progress Note   [name] ZOX:[id number] DOB: [birth year] DOA: 10/21/2022     11 DOS: the patient was seen and examined on 11/01/2022   Brief Wells course: Ethan Wells is a 51 y.o. male with medical history significant of polysubstance abuse, coronary artery disease, anemia due to hemmoroids, QT prolongation chronic pain syndrome, who presents to the ER today on account of generalized body aches and pains.  At the ER he was found to have potassium of 2.2 subsequently started on IV potassium.  Patient is a poor historian. During my encounter patient was complaining of pain in his arm due to the IV potassium. He endorses recent use of cocaine and alcohol.   Labs concerning for hyponatremia sodium 133, potassium 2.2, magnesium 1.7, hemoglobin 8.8.   Chest x-ray shows no pneumonia, pneumothorax or pleural effusion.   EKG shows no acute ST-T wave changes, first 2 sets of high-sensitivity troponins were 13, 13.  Patient was started on a trial of Solu-Medrol to see if this helps out his pains.  ANA negative. Rheumatoid factor slightly elevated.  Dr. Lucky Wells did a angiogram and placed a stent in the superior mesenteric artery.  Patient's epigastric pain improved after this.  Patient on aspirin.  Patient complaining of rectal bleeding.  Reviewed recent colonoscopy and flexible sigmoidoscopy and the patient does have some bleeding hemorrhoids.  Anusol ordered.  Dr. Hampton Wells did a hemorrhoidal procedure on 10/28/2022.  On 10/29/2022, the patient had some chest pain and then developed shortness of breath.  EKGs were done which showed nonspecific changes.  Troponins pending.  Chest x-ray looks like pulmonary vascular congestion and 60 mg of Lasix was given.  The patient was started on BiPAP and transferred to the stepdown unit.  We will give another dose of IV Lasix this evening.  Reviewed recent echocardiogram with an EF of 60%.  Case discussed with Ethan Wells cardiology.  Aspirin  restarted.  11/15: Hemodynamically stable on 2 L this morning.  Troponin peaked at 1607, CTA was done for concern of PE and it was negative for PE, did have some finding consistent with pulmonary hypertension and bilateral groundglass opacities with mild pleural effusion. He was placed on heparin infusion, most likely will need cardiac cath based on his cardiac history but cardiology would like to give him a DAPT challenge before due to recent GI bleed. Hemoglobin currently stable around 8.4.  BNP elevated at 1049. Patient continued to have mild intermittent hematochezia, per surgery it is common after hemorrhoidectomy.  11/16: Hemodynamically stable, patient had bright red blood per rectum was using commode for BM earlier this morning, proximately 200 mL of blood seen by nursing staff, also had 10 out of 10 rectal pain.  Surgical site probably patient was made n.p.o. as they will take him back to the OR for examination under anesthesia.  Hemoglobin dropped to from 8.4 yesterday. He was found to have 2 bleeding pedicles which were ligated by general surgery in the OR.  11/17: Patient with no more active bleeding, hemoglobin dropped to 7.7.  Continued to have some rectal pain.  Ordered 1 unit of PRBC due to recent ACS. Cardiology to do cardiac cath on Monday, If hemoglobin remained stable  Assessment and Plan: * Acute on chronic diastolic CHF (congestive heart failure) ([place]) Patient with some chest pain and shortness of breath today.  EKG shows nonspecific ST-T wave changes.  Chest x-ray consistent with heart failure.  Patient given 60 mg of IV Lasix will repeat  in the afternoon.  Troponins elevated, BNP at 1049.  Recent echocardiogram with normal EF and no regional wall motion abnormalities. - Cardiology was  consultation.  Restarted aspirin.   -Continue with IV Lasix 40 mg daily -Daily weight and BMP -Strict intake and output  Acute respiratory distress Secondary to heart failure.  Requiring  BiPAP overnight, not transition back to baseline oxygen requirement of 2 L. -Continue supplemental oxygen -Continue with BiPAP as needed  Hemorrhoids, internal, with bleeding Dr. Hampton Wells took to the operating room for hemorrhoid procedure.  Patient had another significant bleed this morning and was taken to the OR again and found to have 2 bleeding pedicles which were ligated. -Monitor hemoglobin-dropped to 7.7, so 1 unit of PRBC ordered. -Transfuse if below 8 -Heparin was restarted yesterday around 6 pm  Mesenteric artery stenosis (HCC) Mesenteric ischemia.  Dr. Lucky Wells placed a stent in the SMA on 10/24/2022.  Restarted aspirin.   Arterial ultrasound negative for pseudoaneurysm.  NSTEMI (non-ST elevated myocardial infarction) Ethan Wells) Cardiac enzymes negative on admission.  Patient had a recent stress test that was a low risk scan.  Test was consistent with prior myocardial infarction.  No ischemia seen.  Repeat chest pain yesterday which also resulted in elevated troponin which peaked at 1600, concern of NSTEMI.  He was started on heparin infusion. -Cardiac catheterization most likely on Monday if no more bleeding -Continue with supportive care -Appreciate cardiology help  Epigastric pain Seems to improved after placing a stent in the SMA.  Also reviewed endoscopy from 05/30/2022 showing esophagitis and portal hypertensive gastropathy and duodenitis.  We will continue empiric PPI.  CAD (coronary artery disease) On Crestor, restarted aspirin.  Hypokalemia Resolved  Hyperkalemia Resolved.  Elevated rheumatoid factor Will refer to rheumatology as outpatient.  Patient not the best candidate for methotrexate with history of drinking.  We will continue low-dose steroids for now.  Iron deficiency anemia Last hemoglobin 8.3.  IV iron was given in the Wells 10/25/2022 and 10/27/22.  Prolonged QT interval Monitoring on telemetry  Alcohol dependence ([name]) No signs of withdrawal.  Continue  folate thiamine.   Subjective: Patient was seen and examined today.  Continued to have some rectal pain.  No more bleeding per rectum at this time.  Physical Exam: Vitals:   11/01/22 0355 11/01/22 0719 11/01/22 0833 11/01/22 1216  BP: 112/74  126/73 109/64  Pulse: 83  79 82  Resp: '18  18 18  '$ Temp: 98 F (36.7 C)  98.3 F (36.8 C) 98.1 F (36.7 C)  TempSrc:      SpO2: 98%  97% 97%  Weight:  89.6 kg    Height:       General.  Obese gentleman, in no acute distress. Pulmonary.  Lungs clear bilaterally, normal respiratory effort. CV.  Regular rate and rhythm, no JVD, rub or murmur. Abdomen.  Soft, nontender, nondistended, BS positive. CNS.  Alert and oriented .  No focal neurologic deficit. Extremities.  No edema, no cyanosis, pulses intact and symmetrical. Psychiatry.  Judgment and insight appears normal.   Data Reviewed: Prior data reviewed  Family Communication: Discussed with patient  Disposition: Status is: Inpatient Remains inpatient appropriate because: Severity of illness  Planned Discharge Destination: Home  Prophylaxis.  Heparin fusion Time spent: 50 minutes  This record has been created using Systems analyst. Errors have been sought and corrected,but may not always be located. Such creation errors do not reflect on the standard of care.   Author: Lorella Nimrod, MD  11/01/2022 2:18 PM  For on call review [email or web address].

## 2022-11-02 DIAGNOSIS — I5033 Acute on chronic diastolic (congestive) heart failure: Secondary | ICD-10-CM | POA: Diagnosis not present

## 2022-11-02 DIAGNOSIS — I10 Essential (primary) hypertension: Secondary | ICD-10-CM

## 2022-11-02 DIAGNOSIS — I214 Non-ST elevation (NSTEMI) myocardial infarction: Secondary | ICD-10-CM | POA: Diagnosis not present

## 2022-11-02 LAB — CBC
HCT: 29.9 % — ABNORMAL LOW (ref 39.0–52.0)
HCT: 29 % — ABNORMAL LOW (ref 39.0–52.0)
Hemoglobin: 9.6 g/dL — ABNORMAL LOW (ref 13.0–17.0)
Hemoglobin: 9.3 g/dL — ABNORMAL LOW (ref 13.0–17.0)
MCH: 30.1 pg (ref 26.0–34.0)
MCH: 30.5 pg (ref 26.0–34.0)
MCHC: 32.1 g/dL (ref 30.0–36.0)
MCHC: 32.1 g/dL (ref 30.0–36.0)
MCV: 93.7 fL (ref 80.0–100.0)
MCV: 95.1 fL (ref 80.0–100.0)
Platelets: 136 10*3/uL — ABNORMAL LOW (ref 150–400)
Platelets: 113 10*3/uL — ABNORMAL LOW (ref 150–400)
RBC: 3.19 MIL/uL — ABNORMAL LOW (ref 4.22–5.81)
RBC: 3.05 MIL/uL — ABNORMAL LOW (ref 4.22–5.81)
RDW: 22.7 % — ABNORMAL HIGH (ref 11.5–15.5)
RDW: 22.7 % — ABNORMAL HIGH (ref 11.5–15.5)
WBC: 12.5 10*3/uL — ABNORMAL HIGH (ref 4.0–10.5)
WBC: 12.2 10*3/uL — ABNORMAL HIGH (ref 4.0–10.5)
nRBC: 0 % (ref 0.0–0.2)
nRBC: 0 % (ref 0.0–0.2)

## 2022-11-02 LAB — BASIC METABOLIC PANEL
Anion gap: 7 (ref 5–15)
BUN: 14 mg/dL (ref 6–20)
CO2: 24 mmol/L (ref 22–32)
Calcium: 8.9 mg/dL (ref 8.9–10.3)
Chloride: 108 mmol/L (ref 98–111)
Creatinine, Ser: 0.63 mg/dL (ref 0.61–1.24)
GFR, Estimated: >60 mL/min (ref >60)
Glucose, Bld: 117 mg/dL — ABNORMAL HIGH (ref 70–99)
Potassium: 4.9 mmol/L (ref 3.5–5.1)
Sodium: 139 mmol/L (ref 135–145)

## 2022-11-02 MED ORDER — ACETAMINOPHEN 500 MG PO TABS
1000.0000 mg | ORAL_TABLET | Freq: Four times a day (QID) | ORAL | Status: DC
  Administered 2022-11-02 – 2022-11-08 (×22): 1000 mg via ORAL
  Filled 2022-11-02 (×23): qty 2

## 2022-11-02 MED ORDER — CELECOXIB 200 MG PO CAPS
200.0000 mg | ORAL_CAPSULE | Freq: Two times a day (BID) | ORAL | Status: DC
  Administered 2022-11-02 – 2022-11-06 (×10): 200 mg via ORAL
  Filled 2022-11-02 (×11): qty 1

## 2022-11-02 NOTE — Assessment & Plan Note (Signed)
On Crestor, restarted aspirin.

## 2022-11-02 NOTE — Progress Notes (Signed)
Subjective:  CC: Ethan Wells is a 51 y.o. male  Hospital stay day 12, 2 Days Post-Op repeat EUA and suture ligation of bleed vessels following initial hemorrhoidectomy on 11/13    HPI: Patient reports of continuing bleeding with bowel movements and pain.  He believes overall there has not been a significant change in the amount of bleeding and her pain level.  Continues to strain when he has the urge to have bowel movements  ROS:  General: Denies weight loss, weight gain, fatigue, fevers, chills, and night sweats. Heart: Denies chest pain, palpitations, racing heart, irregular heartbeat, leg pain or swelling, and decreased activity tolerance. Respiratory: Denies breathing difficulty, shortness of breath, wheezing, cough, and sputum. GI: Denies change in appetite, heartburn, nausea, vomiting, constipation, diarrhea, and blood in stool. GU: Denies difficulty urinating, pain with urinating, urgency, frequency, blood in urine.   Objective:   Temp:  [97.5 F (36.4 C)-98.4 F (36.9 C)] 97.7 F (36.5 C) (11/18 1122) Pulse Rate:  [78-88] 80 (11/18 1122) Resp:  [16-20] 18 (11/18 1122) BP: (105-118)/(63-82) 112/68 (11/18 1122) SpO2:  [94 %-100 %] 99 % (11/18 1122)     Height: '5\' 5"'$  (165.1 cm) Weight: 89.6 kg BMI (Calculated): 32.87   Intake/Output this shift:   Intake/Output Summary (Last 24 hours) at 11/02/2022 1335 Last data filed at 11/02/2022 1121 Gross per 24 hour  Intake 2417.67 ml  Output 3500 ml  Net -1082.33 ml    Constitutional :  alert, cooperative, and appears stated age  Respiratory:  clear to auscultation bilaterally  Cardiovascular:  regular rate and rhythm  Gastrointestinal: soft, non-tender; bowel sounds normal; no masses,  no organomegaly.   Skin: Cool and moist.  Limited external exam of the hemorrhoidectomy site and shows no sign of fresh bleeding.  Patient still has significant discomfort even attempting a gentle external exam.  Psychiatric: Normal affect,  non-agitated, not confused       LABS:     Latest Ref Rng & Units 11/02/2022    2:15 AM 11/01/2022    9:04 AM 10/31/2022    6:04 AM  CMP  Glucose 70 - 99 mg/dL 117  174  144   BUN 6 - 20 mg/dL '14  16  20   '$ Creatinine 0.61 - 1.24 mg/dL 0.63  0.69  0.84   Sodium 135 - 145 mmol/L 139  135  140   Potassium 3.5 - 5.1 mmol/L 4.9  4.0  3.7   Chloride 98 - 111 mmol/L 108  102  108   CO2 22 - 32 mmol/L '24  23  25   '$ Calcium 8.9 - 10.3 mg/dL 8.9  8.5  8.6       Latest Ref Rng & Units 11/02/2022    2:15 AM 11/01/2022    5:13 PM 11/01/2022    9:04 AM  CBC  WBC 4.0 - 10.5 K/uL 12.5  10.9  9.7   Hemoglobin 13.0 - 17.0 g/dL 9.6  7.9  7.7   Hematocrit 39.0 - 52.0 % 29.9  24.5  24.4   Platelets 150 - 400 K/uL 136  107  95     RADS: N/a Assessment:   repeat EUA and suture ligation of bleed vessels following initial hemorrhoidectomy on 11/13    Status posttransfusion yesterday.  Hemoglobin from this morning has appropriate increase posttransfusion.  Despite this patient does report continued bloody bowel movements.  We will continue to monitor closely at this time.  Recommend holding heparin drip if possible.  We will switch over to scheduled Tylenol and add Celebrex twice daily to see if better pain control.  Also had extensive discussion with patient regarding minimizing the urge to strain while having bowel movements.     labs/images/medications/previous chart entries reviewed personally and relevant changes/updates noted above.

## 2022-11-02 NOTE — Progress Notes (Signed)
Cardiology Progress Note   Patient Name: Ethan Wells Date of Encounter: 11/02/2022  Primary Cardiologist: Lauree Chandler, MD  Subjective   Recurrent rectal bleeding yesterday.  Heparin off.  Has had occasional twinges of chest pain in the setting of generalized discomfort/rectal pain.  No dyspnea.  Inpatient Medications    Scheduled Meds:  aspirin EC  81 mg Oral Daily   calcium-vitamin D  1 tablet Oral BID   Chlorhexidine Gluconate Cloth  6 each Topical Daily   folic acid  1 mg Oral Daily   furosemide  40 mg Intravenous Daily   gabapentin  300 mg Oral TID   hydrocortisone   Rectal BID   magnesium oxide  400 mg Oral BID   metoprolol tartrate  12.5 mg Oral BID   multivitamin with minerals  1 tablet Oral Daily   nicotine  14 mg Transdermal Daily   pantoprazole  40 mg Oral BID   polyethylene glycol  17 g Oral Daily   predniSONE  20 mg Oral Q breakfast   psyllium  1 packet Oral Daily   rosuvastatin  40 mg Oral Daily   sodium chloride flush  3 mL Intravenous Q12H   thiamine  100 mg Oral Daily   Continuous Infusions:  PRN Meds: acetaminophen, HYDROmorphone (DILAUDID) injection, ipratropium, methocarbamol, nitroGLYCERIN, oxyCODONE   Vital Signs    Vitals:   11/01/22 2037 11/02/22 0026 11/02/22 0342 11/02/22 0811  BP: 112/66 107/67 114/67 117/82  Pulse: 86 81 80 78  Resp: '18 16 18 18  '$ Temp: 98 F (36.7 C) 97.6 F (36.4 C) (!) 97.5 F (36.4 C) 97.6 F (36.4 C)  TempSrc: Oral Oral Axillary   SpO2: 100% 99% 100% 98%  Weight:      Height:        Intake/Output Summary (Last 24 hours) at 11/02/2022 0900 Last data filed at 11/02/2022 0811 Gross per 24 hour  Intake 2417.67 ml  Output 2575 ml  Net -157.33 ml   Filed Weights   10/21/22 1952 10/22/22 0252 11/01/22 0719  Weight: 81.8 kg 84.5 kg 89.6 kg    Physical Exam   GEN: Well nourished, well developed, in no acute distress.  HEENT: Grossly normal.  Neck: Supple, no JVD, carotid bruits, or  masses. Cardiac: RRR, distant, no murmurs, rubs, or gallops. No clubbing, cyanosis, trace bilateral ankle edema.  Radials 2+, DP/PT 2+ and equal bilaterally.  Respiratory:  Respirations regular and unlabored, diminished breath sounds bilat. GI: Soft, nontender, nondistended, BS + x 4. MS: no deformity or atrophy. Skin: warm and dry, no rash. Neuro:  Strength and sensation are intact. Psych: AAOx3.  Normal affect.  Labs    Chemistry Recent Labs  Lab 10/31/22 0604 11/01/22 0904 11/02/22 0215  NA 140 135 139  K 3.7 4.0 4.9  CL 108 102 108  CO2 '25 23 24  '$ GLUCOSE 144* 174* 117*  BUN '20 16 14  '$ CREATININE 0.84 0.69 0.63  CALCIUM 8.6* 8.5* 8.9  GFRNONAA >60 >60 >60  ANIONGAP '7 10 7     '$ Hematology Recent Labs  Lab 11/01/22 0904 11/01/22 1713 11/02/22 0215  WBC 9.7 10.9* 12.5*  RBC 2.53* 2.55* 3.19*  HGB 7.7* 7.9* 9.6*  HCT 24.4* 24.5* 29.9*  MCV 96.4 96.1 93.7  MCH 30.4 31.0 30.1  MCHC 31.6 32.2 32.1  RDW 22.5* 22.6* 22.7*  PLT 95* 107* 136*    Cardiac Enzymes  Recent Labs  Lab 10/21/22 2131 10/29/22 1051 10/29/22 1204 10/29/22 1433 10/29/22  [place]* 1,418*      BNP    Component Value Date/Time   BNP 1,049.3 (H) 10/29/2022 1433   Lipids  Lab Results  Component Value Date   CHOL 120 09/30/2022   HDL 20 (L) 09/30/2022   LDLCALC 87 09/30/2022   TRIG 64 09/30/2022   CHOLHDL 6.0 09/30/2022   HbA1c  Lab Results  Component Value Date   HGBA1C 4.7 (L) 10/01/2022    Radiology    CT Angio Chest Pulmonary Embolism (PE) W or WO Contrast  Result Date: 10/29/2022 CLINICAL DATA:  Pulmonary embolism (PE) suspected, low to intermediate prob, positive D-dimer EXAM: CT ANGIOGRAPHY CHEST WITH CONTRAST TECHNIQUE: Multidetector CT imaging of the chest was performed using the standard protocol during bolus administration of intravenous contrast. Multiplanar CT image reconstructions and MIPs were obtained to evaluate the vascular anatomy.  RADIATION DOSE REDUCTION: This exam was performed according to the departmental dose-optimization program which includes automated exposure control, adjustment of the mA and/or kV according to patient size and/or use of iterative reconstruction technique. CONTRAST:  56m OMNIPAQUE IOHEXOL 350 MG/ML SOLN COMPARISON:  Chest x-ray 10/29/2022, CT angio chest 09/30/2022 FINDINGS: Cardiovascular: Satisfactory opacification of the pulmonary arteries to the segmental level. No evidence of pulmonary embolism. The main pulmonary artery is enlarged measuring up to 3.6 cm. Normal heart size. No significant pericardial effusion. The thoracic aorta is normal in caliber. Mild atherosclerotic plaque of the thoracic aorta. At least 2 vessel coronary artery calcifications. Mediastinum/Nodes: No enlarged mediastinal, hilar, or axillary lymph nodes. Thyroid gland, trachea, and esophagus demonstrate no significant findings. Lungs/Pleura: Peribronchovascular ground-glass airspace opacities scattered throughout the lungs and slightly more prominent in the upper lobes. No pulmonary nodule. No pulmonary mass. Trace bilateral pleural effusions. No pneumothorax. Upper Abdomen: Splenomegaly. Query pericholecystic fluid and gallbladder wall thickening Musculoskeletal: No chest wall abnormality. No suspicious lytic or blastic osseous lesions. No acute displaced fracture. Review of the MIP images confirms the above findings. IMPRESSION: 1. No pulmonary embolus. 2. Enlarged main pulmonary artery suggestive of pulmonary hypertension. 3. Diffuse peribronchovascular ground-glass airspace opacities suggestive of infection/inflammation. Consider repeat CT chest in 3 months to evaluate for resolution. 4. Trace bilateral pleural effusions. 5. Splenomegaly. 6. Query pericholecystic fluid and gallbladder wall thickening. Correlate with liver function-if clinically indicated consider right upper quadrant ultrasound. Electronically Signed   By: MIven FinnM.D.   On: 10/29/2022 17:15   DG Chest Port 1 View  Result Date: 10/29/2022 CLINICAL DATA:  Shortness of breath EXAM: PORTABLE CHEST 1 VIEW COMPARISON:  Chest x-ray dated October 21, 2022 FINDINGS: Unchanged cardiomegaly. New diffuse interstitial opacities. No large pleural effusion or evidence of pneumothorax. IMPRESSION: New diffuse interstitial opacities, compatible with pulmonary edema. Electronically Signed   By: LYetta GlassmanM.D.   On: 10/29/2022 11:02    Telemetry    RSR - Personally Reviewed  Cardiac Studies   2D Echocardiogram Limited - 11.15.2023   1. Left ventricular ejection fraction, by estimation, is 55 to 60%. The  left ventricle has normal function. The left ventricle has no regional  wall motion abnormalities.   2. Right ventricular systolic function is normal. The right ventricular  size is normal.   3. The mitral valve is normal in structure. No evidence of mitral valve  regurgitation.   4. The aortic valve is normal in structure. Aortic valve regurgitation is  not visualized.   5. The inferior vena cava is normal in size with greater than 50%  respiratory variability, suggesting right atrial pressure of 3 mmHg.  Patient Profile     51 year old male with history of CAD/PCI to LAD 2012, cocaine use, presenting with chest pain, shortness of breath, rectal bleeding secondary to hemorrhoid s/p hemorrhoidectomy, recent SMA stenting, rectal bleeding & abl anemia req PRBCs.  Being seen for elevated troponins, peaked at 1600.   Assessment & Plan    1.  NSTEMI/CAD: Status post prior PCI to the LAD.  Presented with chest pain and rectal bleeding on November 6.  Troponin up to 1607, likely secondary to demand ischemia in the setting of flash pulmonary edema/ischemic cardiomyopathy.  He has continued to have intermittent chest discomfort.  Heparin currently on hold secondary to recurrent rectal bleeding following hemorrhoidectomy earlier this hospitalization.  He  remains on aspirin, beta-blocker, and statin therapy.  Plan for diagnostic catheterization Monday provided that rectal bleeding subsides and H&H stable.  The patient understands that risks include but are not limited to stroke (1 in 1000), death (1 in 36), kidney failure [usually temporary] (1 in 500), bleeding (1 in 200), allergic reaction [possibly serious] (1 in 200), and agrees to proceed.    2.  Rectal bleeding: Status post hemorrhoidectomy with repeat ligation on November 16th.  Heparin resumed yesterday but then stopped secondary to recurrent bleeding.  Ongoing rectal pain.  Management per surgical team.  3.  Acute blood loss anemia: In the setting of #2.  H&H down to 7.7 and 24.4 November 17.  Status post packed red blood cells and now he is up to 9.6 and 29.9.  Heparin currently off in the setting of recurrent bleeding last night.  Continue to transfuse as needed to maintain hemoglobin greater than 8.  4.  Essential hypertension: Stable on beta-blocker therapy.  5.  Hyperlipidemia: LDL of 87 in October.  Now on high potency statin therapy.  6.  Acute pulmonary edema: EF 55 to 60% by echo November 15 with normal RV function and no significant valvular disease.  He remains on IV Lasix and was +82.7 mL yesterday, but is -10.9 L since admission.  Exam challenging secondary to body habitus.  Trace ankle edema.  Renal function stable.  Continue current dose of Lasix.  Heart rate and blood pressure stable.  7.  Polysubstance abuse: Smokes cigarettes, uses alcohol, and had positive tox screen for cocaine in October.  Complete cessation advised.  8.  Peripheral arterial disease: Status post SMA stenting.  On aspirin monotherapy.  Continue statin.  Managed by Dr. Lucky Cowboy.  Signed, Murray Hodgkins, NP  11/02/2022, 9:00 AM    For questions or updates, please contact   Please consult [email or web address] for contact info under Cardiology/STEMI.

## 2022-11-02 NOTE — Progress Notes (Signed)
Progress Note   [name] HFW:[id number] DOB: 05-Mar-1971 DOA: 10/21/2022     12 DOS: the patient was seen and examined on 11/02/2022   Brief hospital course: DEMETRIE Wells is a 51 y.o. male with medical history significant of polysubstance abuse, coronary artery disease, anemia due to hemmoroids, QT prolongation chronic pain syndrome, who presents to the ER today on account of generalized body aches and pains.  At the ER he was found to have potassium of 2.2 subsequently started on IV potassium.  Patient is a poor historian. During my encounter patient was complaining of pain in his arm due to the IV potassium. He endorses recent use of cocaine and alcohol.   Labs concerning for hyponatremia sodium 133, potassium 2.2, magnesium 1.7, hemoglobin 8.8.   Chest x-ray shows no pneumonia, pneumothorax or pleural effusion.   EKG shows no acute ST-T wave changes, first 2 sets of high-sensitivity troponins were 13, 13.  Patient was started on a trial of Solu-Medrol to see if this helps out his pains.  ANA negative. Rheumatoid factor slightly elevated.  Dr. Lucky Cowboy did a angiogram and placed a stent in the superior mesenteric artery.  Patient's epigastric pain improved after this.  Patient on aspirin.  Patient complaining of rectal bleeding.  Reviewed recent colonoscopy and flexible sigmoidoscopy and the patient does have some bleeding hemorrhoids.  Anusol ordered.  Dr. Hampton Abbot did a hemorrhoidal procedure on 10/28/2022.  On 10/29/2022, the patient had some chest pain and then developed shortness of breath.  EKGs were done which showed nonspecific changes.  Troponins pending.  Chest x-ray looks like pulmonary vascular congestion and 60 mg of Lasix was given.  The patient was started on BiPAP and transferred to the stepdown unit.  We will give another dose of IV Lasix this evening.  Reviewed recent echocardiogram with an EF of 60%.  Case discussed with Dr. Saunders Revel cardiology.  Aspirin  restarted.  11/15: Hemodynamically stable on 2 L this morning.  Troponin peaked at 1607, CTA was done for concern of PE and it was negative for PE, did have some finding consistent with pulmonary hypertension and bilateral groundglass opacities with mild pleural effusion. He was placed on heparin infusion, most likely will need cardiac cath based on his cardiac history but cardiology would like to give him a DAPT challenge before due to recent GI bleed. Hemoglobin currently stable around 8.4.  BNP elevated at 1049. Patient continued to have mild intermittent hematochezia, per surgery it is common after hemorrhoidectomy.  11/16: Hemodynamically stable, patient had bright red blood per rectum was using commode for BM earlier this morning, proximately 200 mL of blood seen by nursing staff, also had 10 out of 10 rectal pain.  Surgical site probably patient was made n.p.o. as they will take him back to the OR for examination under anesthesia.  Hemoglobin dropped to from 8.4 yesterday. He was found to have 2 bleeding pedicles which were ligated by general surgery in the OR.  11/17: Patient with no more active bleeding, hemoglobin dropped to 7.7.  Continued to have some rectal pain.  Ordered 1 unit of PRBC due to recent ACS. Cardiology to do cardiac cath on Monday, If hemoglobin remained stable.  11/18: Hemodynamically stable.  Had another small bleeding per rectum last night so heparin infusion was discontinued.  Received 1 unit of PRBC with hemoglobin of 7.3, hemoglobin this morning 9.6, with slight increase in leukocytosis to 12.5. Surgery scheduled his Tylenol and added Celebrex to see  if that will help controlling his pain.  Patient should avoid straining during defecation.  Assessment and Plan: * Acute on chronic diastolic CHF (congestive heart failure) ([place]) Patient with some chest pain and shortness of breath today.  EKG shows nonspecific ST-T wave changes.  Chest x-ray consistent with heart  failure.  Patient given 60 mg of IV Lasix will repeat in the afternoon.  Troponins elevated, BNP at 1049.  Recent echocardiogram with normal EF and no regional wall motion abnormalities. - Cardiology was  consultation.  Restarted aspirin.   -Continue with IV Lasix 40 mg daily -Daily weight and BMP -Strict intake and output  Acute respiratory distress Secondary to heart failure.  Requiring BiPAP overnight, not transition back to baseline oxygen requirement of 2 L. -Continue supplemental oxygen -Continue with BiPAP as needed  Hemorrhoids, internal, with bleeding Dr. Hampton Abbot took to the operating room for hemorrhoid procedure.  Patient had another significant bleed this morning and was taken to the OR again and found to have 2 bleeding pedicles which were ligated. Hemoglobin improved to 9.6 after getting 1 unit of PRBC.  Had a small amount of bleeding during defecation. -Discontinue heparin -Patient should avoid straining -Transfuse if below 8   Mesenteric artery stenosis (HCC) Mesenteric ischemia.  Dr. Lucky Cowboy placed a stent in the SMA on 10/24/2022.  Restarted aspirin.   Arterial ultrasound negative for pseudoaneurysm.  NSTEMI (non-ST elevated myocardial infarction) [facility]) Cardiac enzymes negative on admission.  Patient had a recent stress test that was a low risk scan.  Test was consistent with prior myocardial infarction.  No ischemia seen.  Repeat chest pain yesterday which also resulted in elevated troponin which peaked at 1600, concern of NSTEMI.  He was started on heparin infusion but it was discontinued again today due to concern of bleeding.  We will avoid heparin at this time.  No chest pain -Cardiac catheterization most likely on Monday if no more bleeding -Continue with supportive care -Appreciate cardiology help  Epigastric pain Seems to improved after placing a stent in the SMA.  Also reviewed endoscopy from 05/30/2022 showing esophagitis and portal hypertensive gastropathy and  duodenitis.  We will continue empiric PPI.  CAD (coronary artery disease) On Crestor, restarted aspirin.  Hypokalemia Resolved  Hyperkalemia Resolved.  Elevated rheumatoid factor Will refer to rheumatology as outpatient.  Patient not the best candidate for methotrexate with history of drinking.  We will continue low-dose steroids for now.  Iron deficiency anemia Last hemoglobin 8.3.  IV iron was given in the hospital 10/25/2022 and 10/27/22.  Prolonged QT interval Monitoring on telemetry  Alcohol dependence ([place]) No signs of withdrawal.  Continue folate thiamine.   Subjective: Patient continued to have some rectal pain.  Had a small amount of bleeding during defecation this morning.  Physical Exam: Vitals:   11/02/22 0026 11/02/22 0342 11/02/22 0811 11/02/22 1122  BP: 107/67 114/67 117/82 112/68  Pulse: 81 80 78 80  Resp: '16 18 18 18  '$ Temp: 97.6 F (36.4 C) (!) 97.5 F (36.4 C) 97.6 F (36.4 C) 97.7 F (36.5 C)  TempSrc: Oral Axillary    SpO2: 99% 100% 98% 99%  Weight:      Height:       General.  Obese gentleman, in no acute distress. Pulmonary.  Lungs clear bilaterally, normal respiratory effort. CV.  Regular rate and rhythm, no JVD, rub or murmur. Abdomen.  Soft, nontender, nondistended, BS positive. CNS.  Alert and oriented .  No focal neurologic deficit. Extremities.  No edema, no cyanosis, pulses intact and symmetrical. Psychiatry.  Judgment and insight appears normal.   Data Reviewed: Prior data reviewed  Family Communication: Discussed with patient  Disposition: Status is: Inpatient Remains inpatient appropriate because: Severity of illness  Planned Discharge Destination: Home  Prophylaxis.  Heparin fusion Time spent: 45 minutes  This record has been created using Systems analyst. Errors have been sought and corrected,but may not always be located. Such creation errors do not reflect on the standard of care.   Author: Lorella Nimrod, MD 11/02/2022 3:20 PM  For on call review [email or web address].

## 2022-11-02 NOTE — Plan of Care (Signed)

## 2022-11-02 NOTE — Assessment & Plan Note (Signed)
Secondary to heart failure.  Requiring BiPAP overnight, not transition back to baseline oxygen requirement of 2 L. -Continue supplemental oxygen -Continue with BiPAP as needed

## 2022-11-03 DIAGNOSIS — I214 Non-ST elevation (NSTEMI) myocardial infarction: Secondary | ICD-10-CM | POA: Diagnosis not present

## 2022-11-03 DIAGNOSIS — K625 Hemorrhage of anus and rectum: Secondary | ICD-10-CM | POA: Diagnosis not present

## 2022-11-03 DIAGNOSIS — I5033 Acute on chronic diastolic (congestive) heart failure: Secondary | ICD-10-CM | POA: Diagnosis not present

## 2022-11-03 DIAGNOSIS — I10 Essential (primary) hypertension: Secondary | ICD-10-CM | POA: Diagnosis not present

## 2022-11-03 LAB — CBC
HCT: 27.4 % — ABNORMAL LOW (ref 39.0–52.0)
Hemoglobin: 8.8 g/dL — ABNORMAL LOW (ref 13.0–17.0)
MCH: 30.9 pg (ref 26.0–34.0)
MCHC: 32.1 g/dL (ref 30.0–36.0)
MCV: 96.1 fL (ref 80.0–100.0)
Platelets: 113 10*3/uL — ABNORMAL LOW (ref 150–400)
RBC: 2.85 MIL/uL — ABNORMAL LOW (ref 4.22–5.81)
RDW: 22.5 % — ABNORMAL HIGH (ref 11.5–15.5)
WBC: 11 10*3/uL — ABNORMAL HIGH (ref 4.0–10.5)
nRBC: 0 % (ref 0.0–0.2)

## 2022-11-03 LAB — BASIC METABOLIC PANEL
Anion gap: 6 (ref 5–15)
BUN: 15 mg/dL (ref 6–20)
CO2: 26 mmol/L (ref 22–32)
Calcium: 9.1 mg/dL (ref 8.9–10.3)
Chloride: 105 mmol/L (ref 98–111)
Creatinine, Ser: 0.64 mg/dL (ref 0.61–1.24)
GFR, Estimated: >60 mL/min (ref >60)
Glucose, Bld: 116 mg/dL — ABNORMAL HIGH (ref 70–99)
Potassium: 4.3 mmol/L (ref 3.5–5.1)
Sodium: 137 mmol/L (ref 135–145)

## 2022-11-03 MED ORDER — OXYCODONE HCL 5 MG PO TABS
5.0000 mg | ORAL_TABLET | Freq: Four times a day (QID) | ORAL | Status: DC | PRN
  Administered 2022-11-03 – 2022-11-06 (×9): 10 mg via ORAL
  Filled 2022-11-03 (×10): qty 2

## 2022-11-03 MED ORDER — FERROUS SULFATE 325 (65 FE) MG PO TABS
325.0000 mg | ORAL_TABLET | Freq: Two times a day (BID) | ORAL | Status: DC
  Administered 2022-11-03 – 2022-11-08 (×10): 325 mg via ORAL
  Filled 2022-11-03 (×10): qty 1

## 2022-11-03 MED ORDER — CARVEDILOL 3.125 MG PO TABS
3.1250 mg | ORAL_TABLET | Freq: Two times a day (BID) | ORAL | Status: DC
  Administered 2022-11-03 – 2022-11-06 (×6): 3.125 mg via ORAL
  Filled 2022-11-03 (×7): qty 1

## 2022-11-03 MED ORDER — HYDROMORPHONE HCL 1 MG/ML IJ SOLN
0.5000 mg | INTRAMUSCULAR | Status: DC | PRN
  Administered 2022-11-03 – 2022-11-04 (×7): 0.5 mg via INTRAVENOUS
  Filled 2022-11-03 (×7): qty 1

## 2022-11-03 NOTE — Progress Notes (Signed)
Subjective:  CC: Ethan Wells is a 51 y.o. male  Hospital stay day 13, 3 Days Post-Op repeat EUA and suture ligation of bleed vessels following initial hemorrhoidectomy on 11/13    HPI: Patient reports of continuing bleeding with bowel movements and pain.  No change overall reported   ROS:  General: Denies weight loss, weight gain, fatigue, fevers, chills, and night sweats. Heart: Denies chest pain, palpitations, racing heart, irregular heartbeat, leg pain or swelling, and decreased activity tolerance. Respiratory: Denies breathing difficulty, shortness of breath, wheezing, cough, and sputum. GI: Denies change in appetite, heartburn, nausea, vomiting, constipation, diarrhea, and blood in stool. GU: Denies difficulty urinating, pain with urinating, urgency, frequency, blood in urine.   Objective:   Temp:  [97.6 F (36.4 C)-97.9 F (36.6 C)] 97.6 F (36.4 C) (11/19 0505) Pulse Rate:  [70-79] 70 (11/19 0505) Resp:  [18-19] 18 (11/19 0505) BP: (109-134)/(65-79) 109/65 (11/19 0505) SpO2:  [97 %-100 %] 99 % (11/19 0505) FiO2 (%):  [21 %] 21 % (11/18 2140)     Height: '5\' 5"'$  (165.1 cm) Weight: 89.6 kg BMI (Calculated): 32.87   Intake/Output this shift:   Intake/Output Summary (Last 24 hours) at 11/03/2022 1125 Last data filed at 11/02/2022 2300 Gross per 24 hour  Intake 963 ml  Output 500 ml  Net 463 ml    Constitutional :  alert, cooperative, and appears stated age  Respiratory:  clear to auscultation bilaterally  Cardiovascular:  regular rate and rhythm  Gastrointestinal: soft, non-tender; bowel sounds normal; no masses,  no organomegaly.   Skin: Cool and moist.  Exam deferred today due to patient request secondary to pain  Psychiatric: Normal affect, non-agitated, not confused       LABS:     Latest Ref Rng & Units 11/03/2022    3:53 AM 11/02/2022    2:15 AM 11/01/2022    9:04 AM  CMP  Glucose 70 - 99 mg/dL 116  117  174   BUN 6 - 20 mg/dL '15  14  16    '$ Creatinine 0.61 - 1.24 mg/dL 0.64  0.63  0.69   Sodium 135 - 145 mmol/L 137  139  135   Potassium 3.5 - 5.1 mmol/L 4.3  4.9  4.0   Chloride 98 - 111 mmol/L 105  108  102   CO2 22 - 32 mmol/L '26  24  23   '$ Calcium 8.9 - 10.3 mg/dL 9.1  8.9  8.5       Latest Ref Rng & Units 11/03/2022   10:28 AM 11/02/2022    5:40 PM 11/02/2022    2:15 AM  CBC  WBC 4.0 - 10.5 K/uL 11.0  12.2  12.5   Hemoglobin 13.0 - 17.0 g/dL 8.8  9.3  9.6   Hematocrit 39.0 - 52.0 % 27.4  29.0  29.9   Platelets 150 - 400 K/uL 113  113  136     RADS: N/a Assessment:   repeat EUA and suture ligation of bleed vessels following initial hemorrhoidectomy on 11/13    Patient hemoglobin downtrend rate decreasing.  Examination of his latest bowel movements at bedside commode does show blood streaking outside of stool but no significant amount and as expected after 2 post hemorrhoidectomy procedures and continuing aspirin.  Reassured patient there is no additional intervention needed from our standpoint for now.  Further treatment per medical team for other comorbidities.  Recommended patient continue to monitor the pain to see if the Celebrex will have  a better effect after a second day of administration.  labs/images/medications/previous chart entries reviewed personally and relevant changes/updates noted above.

## 2022-11-03 NOTE — Progress Notes (Signed)
Progress Note   [name]:[id number] DOB: [birth year] DOA: 10/21/2022     13 DOS: the patient was seen and examined on 11/03/2022   Brief hospital course: [name] is a 51 y.o. male with medical history significant of polysubstance abuse, coronary artery disease, anemia due to hemmoroids, QT prolongation chronic pain syndrome, who presents to the ER today on account of generalized body aches and pains.  At the ER he was found to have potassium of 2.2 subsequently started on IV potassium.  Patient is a poor historian. During my encounter patient was complaining of pain in his arm due to the IV potassium. He endorses recent use of cocaine and alcohol.   Labs concerning for hyponatremia sodium 133, potassium 2.2, magnesium 1.7, hemoglobin 8.8.   Chest x-ray shows no pneumonia, pneumothorax or pleural effusion.   EKG shows no acute ST-T wave changes, first 2 sets of high-sensitivity troponins were 13, 13.  Patient was started on a trial of Solu-Medrol to see if this helps out his pains.  ANA negative. Rheumatoid factor slightly elevated.  Dr. Lucky Cowboy did a angiogram and placed a stent in the superior mesenteric artery.  Patient's epigastric pain improved after this.  Patient on aspirin.  Patient complaining of rectal bleeding.  Reviewed recent colonoscopy and flexible sigmoidoscopy and the patient does have some bleeding hemorrhoids.  Anusol ordered.  Dr. Hampton Abbot did a hemorrhoidal procedure on 10/28/2022.  On 10/29/2022, the patient had some chest pain and then developed shortness of breath.  EKGs were done which showed nonspecific changes.  Troponins pending.  Chest x-ray looks like pulmonary vascular congestion and 60 mg of Lasix was given.  The patient was started on BiPAP and transferred to the stepdown unit.  We will give another dose of IV Lasix this evening.  Reviewed recent echocardiogram with an EF of 60%.  Case discussed with Dr. Saunders Revel cardiology.  Aspirin  restarted.  11/15: Hemodynamically stable on 2 L this morning.  Troponin peaked at 1607, CTA was done for concern of PE and it was negative for PE, did have some finding consistent with pulmonary hypertension and bilateral groundglass opacities with mild pleural effusion. He was placed on heparin infusion, most likely will need cardiac cath based on his cardiac history but cardiology would like to give him a DAPT challenge before due to recent GI bleed. Hemoglobin currently stable around 8.4.  BNP elevated at 1049. Patient continued to have mild intermittent hematochezia, per surgery it is common after hemorrhoidectomy.  11/16: Hemodynamically stable, patient had bright red blood per rectum was using commode for BM earlier this morning, proximately 200 mL of blood seen by nursing staff, also had 10 out of 10 rectal pain.  Surgical site probably patient was made n.p.o. as they will take him back to the OR for examination under anesthesia.  Hemoglobin dropped to from 8.4 yesterday. He was found to have 2 bleeding pedicles which were ligated by general surgery in the OR.  11/17: Patient with no more active bleeding, hemoglobin dropped to 7.7.  Continued to have some rectal pain.  Ordered 1 unit of PRBC due to recent ACS. Cardiology to do cardiac cath on Monday, If hemoglobin remained stable.  11/18: Hemodynamically stable.  Had another small bleeding per rectum last night so heparin infusion was discontinued.  Received 1 unit of PRBC with hemoglobin of 7.3, hemoglobin this morning 9.6, with slight increase in leukocytosis to 12.5. Surgery scheduled his Tylenol and added Celebrex to see  if that will help controlling his pain.  Patient should avoid straining during defecation.  11/19: Patient continued to have mild lower GI bleeding with bowel movement.  Surgery does not have any more to offer at this time, stating that this much bleeding was anticipated however while after hemorrhoidectomy.  Hemoglobin  slowly trending down, at 8.8 today.  Most likely will get his cardiac cath tomorrow.  Keep holding heparin  Assessment and Plan: * Acute on chronic diastolic CHF (congestive heart failure) ([place]) Patient with some chest pain and shortness of breath today.  EKG shows nonspecific ST-T wave changes.  Chest x-ray consistent with heart failure.  Patient given 60 mg of IV Lasix will repeat in the afternoon.  Troponins elevated, BNP at 1049.  Recent echocardiogram with normal EF and no regional wall motion abnormalities. - Cardiology was  consultation.  Restarted aspirin.   -Continue with IV Lasix 40 mg daily -Daily weight and BMP -Strict intake and output  Acute respiratory distress Secondary to heart failure.  Requiring BiPAP overnight, not transition back to baseline oxygen requirement of 2 L. -Continue supplemental oxygen -Continue with BiPAP as needed  Hemorrhoids, internal, with bleeding Dr. Hampton Abbot took to the operating room for hemorrhoid procedure.  Patient had another significant bleed this morning and was taken to the OR again and found to have 2 bleeding pedicles which were ligated. Hemoglobin 8.8 s/p 1 unit PRBC.  Had a small amount of bleeding during defecation.  Heparin was stopped -Patient should avoid straining -Starting him on iron supplement -Transfuse if below 8   Mesenteric artery stenosis (HCC) Mesenteric ischemia.  Dr. Lucky Cowboy placed a stent in the SMA on 10/24/2022.  Restarted aspirin.   Arterial ultrasound negative for pseudoaneurysm.  NSTEMI (non-ST elevated myocardial infarction) [facility]) Cardiac enzymes negative on admission.  Patient had a recent stress test that was a low risk scan.  Test was consistent with prior myocardial infarction.  No ischemia seen.  Repeat chest pain yesterday which also resulted in elevated troponin which peaked at 1600, concern of NSTEMI.  He was started on heparin infusion but it was discontinued again today due to concern of bleeding.  We will  avoid heparin at this time.  No chest pain -Cardiac catheterization most likely on Monday if no more significant bleeding -Continue with supportive care -Appreciate cardiology help  Epigastric pain Seems to improved after placing a stent in the SMA.  Also reviewed endoscopy from 05/30/2022 showing esophagitis and portal hypertensive gastropathy and duodenitis.  We will continue empiric PPI.  CAD (coronary artery disease) On Crestor, restarted aspirin.  Hypokalemia Resolved  Hyperkalemia Resolved.  Elevated rheumatoid factor Will refer to rheumatology as outpatient.  Patient not the best candidate for methotrexate with history of drinking.  We will continue low-dose steroids for now.  Iron deficiency anemia Last hemoglobin 8.8.  IV iron was given in the hospital 10/25/2022 and 10/27/22. -Starting him on iron supplement  Prolonged QT interval Monitoring on telemetry  Alcohol dependence ([place]) No signs of withdrawal.  Continue folate thiamine.   Subjective: Patient continued to have some bleeding per rectum with defecation.  He appears comfortable but continued to complain about 10 out of 10 pain and asking for Dilaudid every hour.   Physical Exam: Vitals:   11/03/22 0019 11/03/22 0505 11/03/22 0900 11/03/22 1153  BP: 120/79 109/65 107/76 105/68  Pulse: 72 70 72 76  Resp: '18 18 20 20  '$ Temp: 97.6 F (36.4 C) 97.6 F (36.4 C) 98.2 F (36.8  C) 97.9 F (36.6 C)  TempSrc:   Oral   SpO2: 99% 99% 98% 99%  Weight:      Height:       General.  Obese gentleman, in no acute distress. Pulmonary.  Lungs clear bilaterally, normal respiratory effort. CV.  Regular rate and rhythm, no JVD, rub or murmur. Abdomen.  Soft, nontender, nondistended, BS positive. CNS.  Alert and oriented .  No focal neurologic deficit. Extremities.  No edema, no cyanosis, pulses intact and symmetrical. Psychiatry.  Judgment and insight appears normal.   Data Reviewed: Prior data reviewed  Family  Communication: Discussed with patient  Disposition: Status is: Inpatient Remains inpatient appropriate because: Severity of illness  Planned Discharge Destination: Home  Prophylaxis.  Heparin fusion Time spent: 43 minutes  This record has been created using Systems analyst. Errors have been sought and corrected,but may not always be located. Such creation errors do not reflect on the standard of care.   Author: Lorella Nimrod, MD 11/03/2022 1:59 PM  For on call review [email or web address].

## 2022-11-03 NOTE — H&P (View-Only) (Signed)
Rounding Note    Patient Name: Ethan Wells Date of Encounter: 11/03/2022  [place] Cardiologist: Ethan Chandler, MD   Subjective   Continues to have rectal pain.  +bleeding with BM  Inpatient Medications    Scheduled Meds:  acetaminophen  1,000 mg Oral Q6H   aspirin EC  81 mg Oral Daily   calcium-vitamin D  1 tablet Oral BID   celecoxib  200 mg Oral BID   Chlorhexidine Gluconate Cloth  6 each Topical Daily   folic acid  1 mg Oral Daily   furosemide  40 mg Intravenous Daily   gabapentin  300 mg Oral TID   hydrocortisone   Rectal BID   magnesium oxide  400 mg Oral BID   metoprolol tartrate  12.5 mg Oral BID   multivitamin with minerals  1 tablet Oral Daily   nicotine  14 mg Transdermal Daily   pantoprazole  40 mg Oral BID   polyethylene glycol  17 g Oral Daily   predniSONE  20 mg Oral Q breakfast   psyllium  1 packet Oral Daily   rosuvastatin  40 mg Oral Daily   sodium chloride flush  3 mL Intravenous Q12H   thiamine  100 mg Oral Daily   Continuous Infusions:  PRN Meds: HYDROmorphone (DILAUDID) injection, ipratropium, methocarbamol, nitroGLYCERIN, oxyCODONE   Vital Signs    Vitals:   11/02/22 1943 11/02/22 2140 11/03/22 0019 11/03/22 0505  BP: 111/70  120/79 109/65  Pulse: 78  72 70  Resp: '18  18 18  '$ Temp: 97.9 F (36.6 C)  97.6 F (36.4 C) 97.6 F (36.4 C)  TempSrc:      SpO2: 98% 97% 99% 99%  Weight:      Height:        Intake/Output Summary (Last 24 hours) at 11/03/2022 0849 Last data filed at 11/02/2022 2300 Gross per 24 hour  Intake 1203 ml  Output 1925 ml  Net -722 ml      11/01/2022    7:19 AM 10/22/2022    2:52 AM 10/21/2022    7:52 PM  Last 3 Weights  Weight (lbs) 197 lb 8.5 oz 186 lb 4.6 oz 180 lb 6.4 oz  Weight (kg) 89.6 kg 84.5 kg 81.829 kg      Telemetry    Sinus rhythm - Personally Reviewed  ECG    N/a - Personally Reviewed  Physical Exam   VS:  BP 109/65 (BP Location: Left Arm)   Pulse 70    Temp 97.6 F (36.4 C)   Resp 18   Ht '5\' 5"'$  (1.651 m)   Wt 89.6 kg   SpO2 99%   BMI 32.87 kg/m  , BMI Body mass index is 32.87 kg/m. GENERAL:  Well appearing HEENT: Pupils equal round and reactive, fundi not visualized, oral mucosa unremarkable NECK:  No jugular venous distention, waveform within normal limits, carotid upstroke brisk and symmetric, no bruits, no thyromegaly LUNGS:  Clear to auscultation bilaterally HEART:  RRR.  PMI not displaced or sustained,S1 and S2 within normal limits, no S3, no S4, no clicks, no rubs, no murmurs ABD:  Flat, positive bowel sounds normal in frequency in pitch, no bruits, no rebound, no guarding, no midline pulsatile mass, no hepatomegaly, no splenomegaly EXT:  2 plus pulses throughout, no edema, no cyanosis no clubbing SKIN:  No rashes no nodules NEURO:  Cranial nerves II through XII grossly intact, motor grossly intact throughout PSYCH:  Cognitively intact, oriented to person place  and time  Labs    High Sensitivity Troponin:   Recent Labs  Lab 10/21/22 2131 10/29/22 1051 10/29/22 1204 10/29/22 1433 10/29/22 1636  TROPONINIHS 13 15 241* 1,607* 1,418*     Chemistry Recent Labs  Lab 11/01/22 0904 11/02/22 0215 11/03/22 0353  NA 135 139 137  K 4.0 4.9 4.3  CL 102 108 105  CO2 '23 24 26  '$ GLUCOSE 174* 117* 116*  BUN '16 14 15  '$ CREATININE 0.69 0.63 0.64  CALCIUM 8.5* 8.9 9.1  GFRNONAA >60 >60 >60  ANIONGAP '10 7 6    '$ Lipids No results for input(s): "CHOL", "TRIG", "HDL", "LABVLDL", "LDLCALC", "CHOLHDL" in the last 168 hours.  Hematology Recent Labs  Lab 11/01/22 1713 11/02/22 0215 11/02/22 1740  WBC 10.9* 12.5* 12.2*  RBC 2.55* 3.19* 3.05*  HGB 7.9* 9.6* 9.3*  HCT 24.5* 29.9* 29.0*  MCV 96.1 93.7 95.1  MCH 31.0 30.1 30.5  MCHC 32.2 32.1 32.1  RDW 22.6* 22.7* 22.7*  PLT 107* 136* 113*   Thyroid No results for input(s): "TSH", "FREET4" in the last 168 hours.  BNP Recent Labs  Lab 10/29/22 1433  BNP 1,049.3*     DDimer No results for input(s): "DDIMER" in the last 168 hours.   Radiology    No results found.  Cardiac Studies   Echo 10/30/22: IMPRESSIONS    1. Left ventricular ejection fraction, by estimation, is 55 to 60%. The  left ventricle has normal function. The left ventricle has no regional  wall motion abnormalities.   2. Right ventricular systolic function is normal. The right ventricular  size is normal.   3. The mitral valve is normal in structure. No evidence of mitral valve  regurgitation.   4. The aortic valve is normal in structure. Aortic valve regurgitation is  not visualized.   5. The inferior vena cava is normal in size with greater than 50%  respiratory variability, suggesting right atrial pressure of 3 mmHg.    Patient Profile     Ethan Wells is a 15M with CAD s/p PCI, cocaine abuse, hypertension, and hyperlipidemia admitted with GIB and NSTEMI.  Man complaint at this time is post-surgical pain.   Assessment & Plan    # NSTEMI:  # Hyperlipidemia: Known LAD PCI.  Now here with CP and NSTEMI in the setting of GIB.  Underwent hemorrhoidectomy with repeat ligation on 11/16.  Heparin on hold and going for [facility] Monday.  Continue aspirin, metoprolol and rosuvastatin.  H/h pending for today.  He did still have blood with BM today.  H/h needs to be stable before he can go for cath.    # PAD:  Mesenteric artery stenosis s/p SMA stent 10/24/22.  Continue aspirin.   # Anemia:  # Rectal bleeding:  Waiting for h/h as above.  Holding heparin.  # Hypertension:  BP stable on metoprolol.  Given cocaine use will swtich to carvedilol.   # Polysubstance abuse:  Cocaine, EtOH, tobacco.  Advised cessation.       For questions or updates, please contact Ethan Wells Please consult [email or web address] for contact info under        Signed, Ethan Latch, MD  11/03/2022, 8:49 AM

## 2022-11-03 NOTE — Assessment & Plan Note (Signed)
Last hemoglobin 8.8.  IV iron was given in the hospital 10/25/2022 and 10/27/22. -Starting him on iron supplement

## 2022-11-03 NOTE — Progress Notes (Signed)
Rounding Note    Patient Name: Ethan Wells Date of Encounter: 11/03/2022  [place] Cardiologist: Lauree Chandler, MD   Subjective   Continues to have rectal pain.  +bleeding with BM  Inpatient Medications    Scheduled Meds:  acetaminophen  1,000 mg Oral Q6H   aspirin EC  81 mg Oral Daily   calcium-vitamin D  1 tablet Oral BID   celecoxib  200 mg Oral BID   Chlorhexidine Gluconate Cloth  6 each Topical Daily   folic acid  1 mg Oral Daily   furosemide  40 mg Intravenous Daily   gabapentin  300 mg Oral TID   hydrocortisone   Rectal BID   magnesium oxide  400 mg Oral BID   metoprolol tartrate  12.5 mg Oral BID   multivitamin with minerals  1 tablet Oral Daily   nicotine  14 mg Transdermal Daily   pantoprazole  40 mg Oral BID   polyethylene glycol  17 g Oral Daily   predniSONE  20 mg Oral Q breakfast   psyllium  1 packet Oral Daily   rosuvastatin  40 mg Oral Daily   sodium chloride flush  3 mL Intravenous Q12H   thiamine  100 mg Oral Daily   Continuous Infusions:  PRN Meds: HYDROmorphone (DILAUDID) injection, ipratropium, methocarbamol, nitroGLYCERIN, oxyCODONE   Vital Signs    Vitals:   11/02/22 1943 11/02/22 2140 11/03/22 0019 11/03/22 0505  BP: 111/70  120/79 109/65  Pulse: 78  72 70  Resp: '18  18 18  '$ Temp: 97.9 F (36.6 C)  97.6 F (36.4 C) 97.6 F (36.4 C)  TempSrc:      SpO2: 98% 97% 99% 99%  Weight:      Height:        Intake/Output Summary (Last 24 hours) at 11/03/2022 0849 Last data filed at 11/02/2022 2300 Gross per 24 hour  Intake 1203 ml  Output 1925 ml  Net -722 ml      11/01/2022    7:19 AM 10/22/2022    2:52 AM 10/21/2022    7:52 PM  Last 3 Weights  Weight (lbs) 197 lb 8.5 oz 186 lb 4.6 oz 180 lb 6.4 oz  Weight (kg) 89.6 kg 84.5 kg 81.829 kg      Telemetry    Sinus rhythm - Personally Reviewed  ECG    N/a - Personally Reviewed  Physical Exam   VS:  BP 109/65 (BP Location: Left Arm)   Pulse 70    Temp 97.6 F (36.4 C)   Resp 18   Ht '5\' 5"'$  (1.651 m)   Wt 89.6 kg   SpO2 99%   BMI 32.87 kg/m  , BMI Body mass index is 32.87 kg/m. GENERAL:  Well appearing HEENT: Pupils equal round and reactive, fundi not visualized, oral mucosa unremarkable NECK:  No jugular venous distention, waveform within normal limits, carotid upstroke brisk and symmetric, no bruits, no thyromegaly LUNGS:  Clear to auscultation bilaterally HEART:  RRR.  PMI not displaced or sustained,S1 and S2 within normal limits, no S3, no S4, no clicks, no rubs, no murmurs ABD:  Flat, positive bowel sounds normal in frequency in pitch, no bruits, no rebound, no guarding, no midline pulsatile mass, no hepatomegaly, no splenomegaly EXT:  2 plus pulses throughout, no edema, no cyanosis no clubbing SKIN:  No rashes no nodules NEURO:  Cranial nerves II through XII grossly intact, motor grossly intact throughout PSYCH:  Cognitively intact, oriented to person place  and time  Labs    High Sensitivity Troponin:   Recent Labs  Lab 10/21/22 2131 10/29/22 1051 10/29/22 1204 10/29/22 1433 10/29/22 1636  TROPONINIHS 13 15 241* 1,607* 1,418*     Chemistry Recent Labs  Lab 11/01/22 0904 11/02/22 0215 11/03/22 0353  NA 135 139 137  K 4.0 4.9 4.3  CL 102 108 105  CO2 '23 24 26  '$ GLUCOSE 174* 117* 116*  BUN '16 14 15  '$ CREATININE 0.69 0.63 0.64  CALCIUM 8.5* 8.9 9.1  GFRNONAA >60 >60 >60  ANIONGAP '10 7 6    '$ Lipids No results for input(s): "CHOL", "TRIG", "HDL", "LABVLDL", "LDLCALC", "CHOLHDL" in the last 168 hours.  Hematology Recent Labs  Lab 11/01/22 1713 11/02/22 0215 11/02/22 1740  WBC 10.9* 12.5* 12.2*  RBC 2.55* 3.19* 3.05*  HGB 7.9* 9.6* 9.3*  HCT 24.5* 29.9* 29.0*  MCV 96.1 93.7 95.1  MCH 31.0 30.1 30.5  MCHC 32.2 32.1 32.1  RDW 22.6* 22.7* 22.7*  PLT 107* 136* 113*   Thyroid No results for input(s): "TSH", "FREET4" in the last 168 hours.  BNP Recent Labs  Lab 10/29/22 1433  BNP 1,049.3*     DDimer No results for input(s): "DDIMER" in the last 168 hours.   Radiology    No results found.  Cardiac Studies   Echo 10/30/22: IMPRESSIONS    1. Left ventricular ejection fraction, by estimation, is 55 to 60%. The  left ventricle has normal function. The left ventricle has no regional  wall motion abnormalities.   2. Right ventricular systolic function is normal. The right ventricular  size is normal.   3. The mitral valve is normal in structure. No evidence of mitral valve  regurgitation.   4. The aortic valve is normal in structure. Aortic valve regurgitation is  not visualized.   5. The inferior vena cava is normal in size with greater than 50%  respiratory variability, suggesting right atrial pressure of 3 mmHg.    Patient Profile     Mr. Satterly is a 27M with CAD s/p PCI, cocaine abuse, hypertension, and hyperlipidemia admitted with GIB and NSTEMI.  Man complaint at this time is post-surgical pain.   Assessment & Plan    # NSTEMI:  # Hyperlipidemia: Known LAD PCI.  Now here with CP and NSTEMI in the setting of GIB.  Underwent hemorrhoidectomy with repeat ligation on 11/16.  Heparin on hold and going for [facility] Monday.  Continue aspirin, metoprolol and rosuvastatin.  H/h pending for today.  He did still have blood with BM today.  H/h needs to be stable before he can go for cath.    # PAD:  Mesenteric artery stenosis s/p SMA stent 10/24/22.  Continue aspirin.   # Anemia:  # Rectal bleeding:  Waiting for h/h as above.  Holding heparin.  # Hypertension:  BP stable on metoprolol.  Given cocaine use will swtich to carvedilol.   # Polysubstance abuse:  Cocaine, EtOH, tobacco.  Advised cessation.       For questions or updates, please contact [place] Please consult [email or web address] for contact info under        Signed, Skeet Latch, MD  11/03/2022, 8:49 AM

## 2022-11-04 ENCOUNTER — Encounter
Admission: EM | Disposition: A | Discharge status: Home / Self Care | Payer: Self-pay | Source: Home / Self Care | Attending: Internal Medicine

## 2022-11-04 ENCOUNTER — Encounter: Payer: Self-pay | Admitting: Cardiovascular Disease

## 2022-11-04 DIAGNOSIS — I5033 Acute on chronic diastolic (congestive) heart failure: Secondary | ICD-10-CM | POA: Diagnosis not present

## 2022-11-04 DIAGNOSIS — I2510 Atherosclerotic heart disease of native coronary artery without angina pectoris: Secondary | ICD-10-CM

## 2022-11-04 DIAGNOSIS — D649 Anemia, unspecified: Secondary | ICD-10-CM

## 2022-11-04 DIAGNOSIS — I214 Non-ST elevation (NSTEMI) myocardial infarction: Secondary | ICD-10-CM | POA: Diagnosis not present

## 2022-11-04 DIAGNOSIS — I5022 Chronic systolic (congestive) heart failure: Secondary | ICD-10-CM

## 2022-11-04 HISTORY — PX: LEFT HEART CATH AND CORONARY ANGIOGRAPHY: CATH118249

## 2022-11-04 LAB — BASIC METABOLIC PANEL
Anion gap: 8 (ref 5–15)
BUN: 13 mg/dL (ref 6–20)
CO2: 25 mmol/L (ref 22–32)
Calcium: 9.1 mg/dL (ref 8.9–10.3)
Chloride: 107 mmol/L (ref 98–111)
Creatinine, Ser: 0.77 mg/dL (ref 0.61–1.24)
GFR, Estimated: >60 mL/min (ref >60)
Glucose, Bld: 114 mg/dL — ABNORMAL HIGH (ref 70–99)
Potassium: 4.8 mmol/L (ref 3.5–5.1)
Sodium: 140 mmol/L (ref 135–145)

## 2022-11-04 LAB — BPAM RBC
Blood Product Expiration Date: 202312202359
ISSUE DATE / TIME: 202311171732
Unit Type and Rh: 6200

## 2022-11-04 LAB — CBC
HCT: 28.3 % — ABNORMAL LOW (ref 39.0–52.0)
Hemoglobin: 8.9 g/dL — ABNORMAL LOW (ref 13.0–17.0)
MCH: 30.4 pg (ref 26.0–34.0)
MCHC: 31.4 g/dL (ref 30.0–36.0)
MCV: 96.6 fL (ref 80.0–100.0)
Platelets: 110 10*3/uL — ABNORMAL LOW (ref 150–400)
RBC: 2.93 MIL/uL — ABNORMAL LOW (ref 4.22–5.81)
RDW: 22.2 % — ABNORMAL HIGH (ref 11.5–15.5)
WBC: 10.5 10*3/uL (ref 4.0–10.5)
nRBC: 0 % (ref 0.0–0.2)

## 2022-11-04 LAB — TYPE AND SCREEN
ABO/RH(D): A POS
Antibody Screen: NEGATIVE
Unit division: 0

## 2022-11-04 SURGERY — LEFT HEART CATH AND CORONARY ANGIOGRAPHY
Anesthesia: Moderate Sedation

## 2022-11-04 MED ORDER — SODIUM CHLORIDE 0.9% FLUSH: 3.0000 mL | INTRAVENOUS | Status: DC | PRN

## 2022-11-04 MED ORDER — SODIUM CHLORIDE 0.9 % IV SOLN: 250.0000 mL | INTRAVENOUS | Status: DC | PRN

## 2022-11-04 MED ORDER — FENTANYL CITRATE (PF) 100 MCG/2ML IJ SOLN
INTRAMUSCULAR | Status: AC
  Filled 2022-11-04: qty 2

## 2022-11-04 MED ORDER — SODIUM CHLORIDE 0.9 % WEIGHT BASED INFUSION: 1.0000 mL/kg/h | INTRAVENOUS | Status: DC

## 2022-11-04 MED ORDER — ASPIRIN 81 MG PO CHEW
81.0000 mg | CHEWABLE_TABLET | ORAL | Status: AC
  Administered 2022-11-04: 81 mg via ORAL
  Filled 2022-11-04: qty 1

## 2022-11-04 MED ORDER — HEPARIN SODIUM (PORCINE) 1000 UNIT/ML IJ SOLN
INTRAMUSCULAR | Status: AC
  Filled 2022-11-04: qty 10

## 2022-11-04 MED ORDER — VERAPAMIL HCL 2.5 MG/ML IV SOLN
INTRAVENOUS | Status: AC
  Filled 2022-11-04: qty 2

## 2022-11-04 MED ORDER — MIDAZOLAM HCL 2 MG/2ML IJ SOLN
INTRAMUSCULAR | Status: AC
  Filled 2022-11-04: qty 2

## 2022-11-04 MED ORDER — ISOSORBIDE MONONITRATE ER 30 MG PO TB24
15.0000 mg | ORAL_TABLET | Freq: Every day | ORAL | Status: DC
  Administered 2022-11-04 – 2022-11-06 (×3): 15 mg via ORAL
  Filled 2022-11-04 (×4): qty 1

## 2022-11-04 MED ORDER — IOHEXOL 300 MG/ML  SOLN
INTRAMUSCULAR | Status: DC | PRN
  Administered 2022-11-04: 55 mL

## 2022-11-04 MED ORDER — HEPARIN SODIUM (PORCINE) 1000 UNIT/ML IJ SOLN
INTRAMUSCULAR | Status: DC | PRN
  Administered 2022-11-04: 3000 [IU] via INTRAVENOUS

## 2022-11-04 MED ORDER — SODIUM CHLORIDE 0.9 % WEIGHT BASED INFUSION
1.0000 mL/kg/h | INTRAVENOUS | Status: DC
  Administered 2022-11-04: 1 mL/kg/h via INTRAVENOUS

## 2022-11-04 MED ORDER — FENTANYL CITRATE (PF) 100 MCG/2ML IJ SOLN
INTRAMUSCULAR | Status: DC | PRN
  Administered 2022-11-04: 25 ug via INTRAVENOUS

## 2022-11-04 MED ORDER — LIDOCAINE 5 % EX OINT
TOPICAL_OINTMENT | Freq: Four times a day (QID) | CUTANEOUS | Status: DC | PRN
  Filled 2022-11-04: qty 35.44

## 2022-11-04 MED ORDER — SODIUM CHLORIDE 0.9 % WEIGHT BASED INFUSION
3.0000 mL/kg/h | INTRAVENOUS | Status: DC
  Administered 2022-11-04: 3 mL/kg/h via INTRAVENOUS

## 2022-11-04 MED ORDER — SODIUM CHLORIDE 0.9 % WEIGHT BASED INFUSION: 3.0000 mL/kg/h | INTRAVENOUS | Status: DC

## 2022-11-04 MED ORDER — MIDAZOLAM HCL 2 MG/2ML IJ SOLN
INTRAMUSCULAR | Status: DC | PRN
  Administered 2022-11-04: 1 mg via INTRAVENOUS

## 2022-11-04 MED ORDER — LIDOCAINE HCL 1 % IJ SOLN
INTRAMUSCULAR | Status: AC
  Filled 2022-11-04: qty 20

## 2022-11-04 MED ORDER — ASPIRIN 81 MG PO CHEW: 81.0000 mg | CHEWABLE_TABLET | ORAL | Status: DC

## 2022-11-04 MED ORDER — VERAPAMIL HCL 2.5 MG/ML IV SOLN
INTRAVENOUS | Status: DC | PRN
  Administered 2022-11-04: 2.5 mg via INTRA_ARTERIAL

## 2022-11-04 MED ORDER — SODIUM CHLORIDE 0.9% FLUSH
3.0000 mL | Freq: Two times a day (BID) | INTRAVENOUS | Status: DC
  Administered 2022-11-04 – 2022-11-08 (×8): 3 mL via INTRAVENOUS

## 2022-11-04 MED ORDER — HEPARIN (PORCINE) IN NACL 1000-0.9 UT/500ML-% IV SOLN
INTRAVENOUS | Status: AC
  Filled 2022-11-04: qty 1000

## 2022-11-04 MED ORDER — HEPARIN (PORCINE) IN NACL 1000-0.9 UT/500ML-% IV SOLN
INTRAVENOUS | Status: DC | PRN
  Administered 2022-11-04 (×2): 500 mL

## 2022-11-04 SURGICAL SUPPLY — 13 items
BAND CMPR LRG ZPHR (HEMOSTASIS) ×1
BAND ZEPHYR COMPRESS 30 LONG (HEMOSTASIS) IMPLANT
CATH INFINITI 5FR JK (CATHETERS) IMPLANT
CATH INFINITI JR4 5F (CATHETERS) IMPLANT
DRAPE BRACHIAL (DRAPES) IMPLANT
GLIDESHEATH SLEND SS 6F .021 (SHEATH) IMPLANT
GUIDEWIRE INQWIRE 1.5J.035X260 (WIRE) IMPLANT
INQWIRE 1.5J .035X260CM (WIRE) ×1 IMPLANT
KIT SYRINGE INJ CVI SPIKEX1 (MISCELLANEOUS) IMPLANT
PACK CARDIAC CATH (CUSTOM PROCEDURE TRAY) ×1 IMPLANT
PROTECTION STATION PRESSURIZED (MISCELLANEOUS) ×1 IMPLANT
SET ATX SIMPLICITY (MISCELLANEOUS) IMPLANT
STATION PROTECTION PRESSURIZED (MISCELLANEOUS) IMPLANT

## 2022-11-04 NOTE — Progress Notes (Signed)
Progress Note   [name] NIO:[id number] DOB: [birth year] DOA: 10/21/2022     14 DOS: the patient was seen and examined on 11/04/2022   Brief hospital course: DENISE BRAMBLETT is a 51 y.o. male with medical history significant of polysubstance abuse, coronary artery disease, anemia due to hemmoroids, QT prolongation chronic pain syndrome, who presents to the ER today on account of generalized body aches and pains.  At the ER he was found to have potassium of 2.2 subsequently started on IV potassium.  Patient is a poor historian. During my encounter patient was complaining of pain in his arm due to the IV potassium. He endorses recent use of cocaine and alcohol.   Labs concerning for hyponatremia sodium 133, potassium 2.2, magnesium 1.7, hemoglobin 8.8.   Chest x-ray shows no pneumonia, pneumothorax or pleural effusion.   EKG shows no acute ST-T wave changes, first 2 sets of high-sensitivity troponins were 13, 13.  Patient was started on a trial of Solu-Medrol to see if this helps out his pains.  ANA negative. Rheumatoid factor slightly elevated.  Dr. Lucky Cowboy did a angiogram and placed a stent in the superior mesenteric artery.  Patient's epigastric pain improved after this.  Patient on aspirin.  Patient complaining of rectal bleeding.  Reviewed recent colonoscopy and flexible sigmoidoscopy and the patient does have some bleeding hemorrhoids.  Anusol ordered.  Dr. Hampton Abbot did a hemorrhoidal procedure on 10/28/2022.  On 10/29/2022, the patient had some chest pain and then developed shortness of breath.  EKGs were done which showed nonspecific changes.  Troponins pending.  Chest x-ray looks like pulmonary vascular congestion and 60 mg of Lasix was given.  The patient was started on BiPAP and transferred to the stepdown unit.  We will give another dose of IV Lasix this evening.  Reviewed recent echocardiogram with an EF of 60%.  Case discussed with Dr. Saunders Revel cardiology.  Aspirin  restarted.  11/15: Hemodynamically stable on 2 L this morning.  Troponin peaked at 1607, CTA was done for concern of PE and it was negative for PE, did have some finding consistent with pulmonary hypertension and bilateral groundglass opacities with mild pleural effusion. He was placed on heparin infusion, most likely will need cardiac cath based on his cardiac history but cardiology would like to give him a DAPT challenge before due to recent GI bleed. Hemoglobin currently stable around 8.4.  BNP elevated at 1049. Patient continued to have mild intermittent hematochezia, per surgery it is common after hemorrhoidectomy.  11/16: Hemodynamically stable, patient had bright red blood per rectum was using commode for BM earlier this morning, proximately 200 mL of blood seen by nursing staff, also had 10 out of 10 rectal pain.  Surgical site probably patient was made n.p.o. as they will take him back to the OR for examination under anesthesia.  Hemoglobin dropped to from 8.4 yesterday. He was found to have 2 bleeding pedicles which were ligated by general surgery in the OR.  11/17: Patient with no more active bleeding, hemoglobin dropped to 7.7.  Continued to have some rectal pain.  Ordered 1 unit of PRBC due to recent ACS. Cardiology to do cardiac cath on Monday, If hemoglobin remained stable.  11/18: Hemodynamically stable.  Had another small bleeding per rectum last night so heparin infusion was discontinued.  Received 1 unit of PRBC with hemoglobin of 7.3, hemoglobin this morning 9.6, with slight increase in leukocytosis to 12.5. Surgery scheduled his Tylenol and added Celebrex to see  if that will help controlling his pain.  Patient should avoid straining during defecation.  11/19: Patient continued to have mild lower GI bleeding with bowel movement.  Surgery does not have any more to offer at this time, stating that this much bleeding was anticipated however while after hemorrhoidectomy.  Hemoglobin  slowly trending down, at 8.8 today.  Most likely will get his cardiac cath tomorrow.  Keep holding heparin.  11/20: Vitals stable.  Labs seems stable with resolution of leukocytosis, hemoglobin stable around 8.9.  Cardiac catheterization today with patent prior PCI but found to have new stenosis at mid left circumflex and mid right coronary artery with collaterals.  No intervention was done.  They can attempt intervention and other times once his GI bleeding issue has been completely resolved as he cannot take DAPT at this time.  They are recommending medical management.  Assessment and Plan: * Acute on chronic diastolic CHF (congestive heart failure) ([place]) Patient with some chest pain and shortness of breath today.  EKG shows nonspecific ST-T wave changes.  Chest x-ray consistent with heart failure.  Patient given 60 mg of IV Lasix will repeat in the afternoon.  Troponins elevated, BNP at 1049.  Recent echocardiogram with normal EF and no regional wall motion abnormalities. - Cardiology was  consultation.  Restarted aspirin.   -Continue with IV Lasix 40 mg daily -Daily weight and BMP -Strict intake and output  Acute respiratory distress Secondary to heart failure.  Requiring BiPAP overnight, not transition back to baseline oxygen requirement of 2 L. -Continue supplemental oxygen -Continue with BiPAP as needed  Hemorrhoids, internal, with bleeding Dr. Hampton Abbot took to the operating room for hemorrhoid procedure.  Patient had another significant bleed this morning and was taken to the OR again and found to have 2 bleeding pedicles which were ligated. Hemoglobin 8.8 s/p 1 unit PRBC.  Had a small amount of bleeding during defecation.  Heparin was stopped -Patient should avoid straining -Starting him on iron supplement -Transfuse if below 8   Mesenteric artery stenosis (HCC) Mesenteric ischemia.  Dr. Lucky Cowboy placed a stent in the SMA on 10/24/2022.  Restarted aspirin.   Arterial ultrasound negative  for pseudoaneurysm.  NSTEMI (non-ST elevated myocardial infarction) [facility]) Cardiac enzymes negative on admission.  Patient had a recent stress test that was a low risk scan.  Test was consistent with prior myocardial infarction.  No ischemia seen.  Repeat chest pain  which also resulted in elevated troponin which peaked at 1600, concern of NSTEMI.  He was started on heparin infusion but it was discontinued again today due to concern of bleeding.  We will avoid heparin at this time.  No chest pain -Cardiac catheterization with new stenosis of mid left circumflex and mid RCA, no intervention was done and it will be planned later as an outpatient once GI bleeding issue completely resolved. Cardiology is recommending medical management -Continue with supportive care -Appreciate cardiology help  Epigastric pain Seems to improved after placing a stent in the SMA.  Also reviewed endoscopy from 05/30/2022 showing esophagitis and portal hypertensive gastropathy and duodenitis.  We will continue empiric PPI.  CAD (coronary artery disease) On Crestor, restarted aspirin.  Hypokalemia Resolved  Hyperkalemia Resolved.  Elevated rheumatoid factor Will refer to rheumatology as outpatient.  Patient not the best candidate for methotrexate with history of drinking.  We will continue low-dose steroids for now.  Iron deficiency anemia Last hemoglobin 8.8.  IV iron was given in the hospital 10/25/2022 and 10/27/22. -Starting  him on iron supplement  Prolonged QT interval Monitoring on telemetry  Alcohol dependence ([place]) No signs of withdrawal.  Continue folate thiamine.   Subjective: Patient with no new concern.  Just had his cardiac catheterization.   Physical Exam: Vitals:   11/04/22 1000 11/04/22 1015 11/04/22 1030 11/04/22 1535  BP: (!) '88/55 92/65 91/62 '$ (!) 97/56  Pulse: 81 88 85 78  Resp: (!) '23 12 14 19  '$ Temp:    98.1 F (36.7 C)  TempSrc:      SpO2: 96% 97% 98% 100%  Weight:       Height:       General.  Obese gentleman, in no acute distress. Pulmonary.  Lungs clear bilaterally, normal respiratory effort. CV.  Regular rate and rhythm, no JVD, rub or murmur. Abdomen.  Soft, nontender, nondistended, BS positive. CNS.  Alert and oriented .  No focal neurologic deficit. Extremities.  No edema, no cyanosis, pulses intact and symmetrical. Psychiatry.  Judgment and insight appears normal.    Data Reviewed: Prior data reviewed  Family Communication: Discussed with patient  Disposition: Status is: Inpatient Remains inpatient appropriate because: Severity of illness  Planned Discharge Destination: Home  Prophylaxis.  Heparin fusion Time spent: 40 minutes  This record has been created using Systems analyst. Errors have been sought and corrected,but may not always be located. Such creation errors do not reflect on the standard of care.   Author: Lorella Nimrod, MD 11/04/2022 4:00 PM  For on call review [email or web address].

## 2022-11-04 NOTE — Progress Notes (Signed)
Rounding Note    Patient Name: JESSON FOSKEY Date of Encounter: 11/04/2022  [place] Cardiologist: Lauree Chandler, MD   Subjective   Continues to have rectal pain.  +bleeding with BM.  He underwent left heart catheterization this morning which showed patent LAD stent with minimal restenosis.  However, both mid left circumflex and mid right coronary artery were noted to be occluded with collaterals.  Inpatient Medications    Scheduled Meds:  acetaminophen  1,000 mg Oral Q6H   aspirin EC  81 mg Oral Daily   calcium-vitamin D  1 tablet Oral BID   carvedilol  3.125 mg Oral BID WC   celecoxib  200 mg Oral BID   Chlorhexidine Gluconate Cloth  6 each Topical Daily   ferrous sulfate  325 mg Oral BID WC   folic acid  1 mg Oral Daily   furosemide  40 mg Intravenous Daily   gabapentin  300 mg Oral TID   hydrocortisone   Rectal BID   isosorbide mononitrate  15 mg Oral Daily   magnesium oxide  400 mg Oral BID   multivitamin with minerals  1 tablet Oral Daily   nicotine  14 mg Transdermal Daily   pantoprazole  40 mg Oral BID   polyethylene glycol  17 g Oral Daily   predniSONE  20 mg Oral Q breakfast   psyllium  1 packet Oral Daily   rosuvastatin  40 mg Oral Daily   sodium chloride flush  3 mL Intravenous Q12H   thiamine  100 mg Oral Daily   Continuous Infusions:  sodium chloride     PRN Meds: sodium chloride, HYDROmorphone (DILAUDID) injection, ipratropium, lidocaine, methocarbamol, nitroGLYCERIN, oxyCODONE, sodium chloride flush   Vital Signs    Vitals:   11/04/22 0945 11/04/22 1000 11/04/22 1015 11/04/22 1030  BP: 99/65 (!) '88/55 92/65 91/62 '$  Pulse: 75 81 88 85  Resp: 10 (!) '23 12 14  '$ Temp:      TempSrc:      SpO2: 96% 96% 97% 98%  Weight:      Height:        Intake/Output Summary (Last 24 hours) at 11/04/2022 1235 Last data filed at 11/04/2022 0942 Gross per 24 hour  Intake 2238 ml  Output 2450 ml  Net -212 ml       11/04/2022     3:45 AM 11/01/2022    7:19 AM 10/22/2022    2:52 AM  Last 3 Weights  Weight (lbs) 199 lb 4.7 oz 197 lb 8.5 oz 186 lb 4.6 oz  Weight (kg) 90.4 kg 89.6 kg 84.5 kg      Telemetry    Sinus rhythm - Personally Reviewed  ECG    N/a - Personally Reviewed  Physical Exam   VS:  BP 91/62   Pulse 85   Temp 97.6 F (36.4 C) (Oral)   Resp 14   Ht '5\' 5"'$  (1.651 m)   Wt 90.4 kg   SpO2 98%   BMI 33.16 kg/m  , BMI Body mass index is 33.16 kg/m. GENERAL:  Well appearing HEENT: Pupils equal round and reactive, fundi not visualized, oral mucosa unremarkable NECK:  No jugular venous distention, waveform within normal limits, carotid upstroke brisk and symmetric, no bruits, no thyromegaly LUNGS:  Clear to auscultation bilaterally HEART:  RRR.  PMI not displaced or sustained,S1 and S2 within normal limits, no S3, no S4, no clicks, no rubs, no murmurs ABD:  Flat, positive bowel sounds normal in  frequency in pitch, no bruits, no rebound, no guarding, no midline pulsatile mass, no hepatomegaly, no splenomegaly EXT:  2 plus pulses throughout, no edema, no cyanosis no clubbing SKIN:  No rashes no nodules NEURO:  Cranial nerves II through XII grossly intact, motor grossly intact throughout PSYCH:  Cognitively intact, oriented to person place and time  Labs    High Sensitivity Troponin:   Recent Labs  Lab 10/21/22 2131 10/29/22 1051 10/29/22 1204 10/29/22 1433 10/29/22 1636  TROPONINIHS 13 15 241* 1,607* 1,418*      Chemistry Recent Labs  Lab 11/02/22 0215 11/03/22 0353 11/04/22 0432  NA 139 137 140  K 4.9 4.3 4.8  CL 108 105 107  CO2 '24 26 25  '$ GLUCOSE 117* 116* 114*  BUN '14 15 13  '$ CREATININE 0.63 0.64 0.77  CALCIUM 8.9 9.1 9.1  GFRNONAA >60 >60 >60  ANIONGAP '7 6 8     '$ Lipids No results for input(s): "CHOL", "TRIG", "HDL", "LABVLDL", "LDLCALC", "CHOLHDL" in the last 168 hours.  Hematology Recent Labs  Lab 11/02/22 1740 11/03/22 1028 11/04/22 0432  WBC 12.2* 11.0* 10.5   RBC 3.05* 2.85* 2.93*  HGB 9.3* 8.8* 8.9*  HCT 29.0* 27.4* 28.3*  MCV 95.1 96.1 96.6  MCH 30.5 30.9 30.4  MCHC 32.1 32.1 31.4  RDW 22.7* 22.5* 22.2*  PLT 113* 113* 110*    Thyroid No results for input(s): "TSH", "FREET4" in the last 168 hours.  BNP Recent Labs  Lab 10/29/22 1433  BNP 1,049.3*     DDimer No results for input(s): "DDIMER" in the last 168 hours.   Radiology    CARDIAC CATHETERIZATION  Result Date: 11/04/2022   Mid Cx lesion is 100% stenosed.   Ramus lesion is 85% stenosed.   Prox LAD to Mid LAD lesion is 5% stenosed.   Prox RCA to Mid RCA lesion is 100% stenosed.   Prox RCA lesion is 80% stenosed.   There is mild left ventricular systolic dysfunction.   LV end diastolic pressure is moderately elevated.   The left ventricular ejection fraction is 45-50% by visual estimate. 1.  Severe two-vessel coronary artery disease with chronically occluded mid left circumflex and mid right coronary artery with collaterals.  Distal vessels were patent with mild disease on previous cardiac catheterization in 2012.  LAD stent is patent with minimal restenosis.  In addition, there is progression of proximal to mid ramus stenosis. 2.  Mildly reduced LV systolic function with an EF of 45%. 3.  Moderately elevated left ventricular end-diastolic pressure at 26 mmHg. Recommendations: The patient has 2 chronic occlusions with collaterals.  Recommend medical therapy for now.  I added small dose Imdur.  Given ongoing rectal bleeding, risks of PCI outweigh the benefit at this time.  In the near future, once his bleeding issues are controlled and if he continues to have anginal symptoms, PCI of the ramus and possibly the right coronary artery can be considered.  The RCA has a microchannel of patent flow. Continue IV furosemide today given elevated LVEDP and switch to oral likely tomorrow.    Cardiac Studies   Echo 10/30/22: IMPRESSIONS    1. Left ventricular ejection fraction, by estimation, is  55 to 60%. The  left ventricle has normal function. The left ventricle has no regional  wall motion abnormalities.   2. Right ventricular systolic function is normal. The right ventricular  size is normal.   3. The mitral valve is normal in structure. No evidence of mitral valve  regurgitation.   4. The aortic valve is normal in structure. Aortic valve regurgitation is  not visualized.   5. The inferior vena cava is normal in size with greater than 50%  respiratory variability, suggesting right atrial pressure of 3 mmHg.    Patient Profile     Mr. Eller is a 26M with CAD s/p PCI, cocaine abuse, hypertension, and hyperlipidemia admitted with GIB and NSTEMI.  Man complaint at this time is post-surgical pain.   Assessment & Plan    # NSTEMI:  # Hyperlipidemia: Known LAD PCI.  Now here with CP and NSTEMI in the setting of GIB.  Underwent hemorrhoidectomy with repeat ligation on 11/16.   Cardiac catheterization done this morning showed patent LAD stent with minimal restenosis.  He was found to have new chronic occlusion in the mid right coronary artery and mid left circumflex.  These were patent on most recent cardiac catheterization in 2012.  In addition, there is significant stenosis in a large ramus branch.  For now, there is no critical disease that requires revascularization.  Recommend medical therapy given that he is not a good candidate for dual antiplatelet therapy in the setting of active lower GI bleed and recent surgery.  I added small dose Imdur. The patient should be followed closely by [place] in the outpatient setting.  If he continues to have anginal symptoms and his bleeding issues resolve, PCI of the ramus and possibly the right coronary artery can be considered.  SMA stent gnosis Mesenteric artery stenosis s/p SMA stent 10/24/22.  Continue aspirin.   # Anemia:  # Rectal bleeding:  Keep hemoglobin above 8 considering his significant coronary artery disease.  #Chronic  systolic heart failure: Mildly reduced EF by left ventricular angiography.  In addition, his LVEDP was moderately elevated at 26 mmHg. Continue IV furosemide today and consider switching to an oral diuretic tomorrow.   # Polysubstance abuse:  Cocaine, EtOH, tobacco.  Advised cessation.       For questions or updates, please contact [place] Please consult [email or web address] for contact info under        Signed, Kathlyn Sacramento, MD  11/04/2022, 12:35 PM

## 2022-11-04 NOTE — Plan of Care (Signed)
Mr. Geter aware of procedure in the AM and to be NPO.  Problem: Education: Goal: Knowledge of General Education information will improve Description: Including pain rating scale, medication(s)/side effects and non-pharmacologic comfort measures Outcome: Progressing   Problem: Activity: Goal: Ability to return to baseline activity level will improve Outcome: Progressing

## 2022-11-04 NOTE — Interval H&P Note (Signed)
History and Physical Interval Note:  11/04/2022 8:35 AM  Ethan Wells  has presented today for surgery, with the diagnosis of nstemi.  The various methods of treatment have been discussed with the patient and family. After consideration of risks, benefits and other options for treatment, the patient has consented to  Procedure(s): LEFT HEART CATH AND CORONARY ANGIOGRAPHY (N/A) as a surgical intervention.  The patient's history has been reviewed, patient examined, no change in status, stable for surgery.  I have reviewed the patient's chart and labs.  Questions were answered to the patient's satisfaction.     Kathlyn Sacramento

## 2022-11-05 DIAGNOSIS — I5033 Acute on chronic diastolic (congestive) heart failure: Secondary | ICD-10-CM | POA: Diagnosis not present

## 2022-11-05 DIAGNOSIS — I2489 Other forms of acute ischemic heart disease: Secondary | ICD-10-CM

## 2022-11-05 DIAGNOSIS — I214 Non-ST elevation (NSTEMI) myocardial infarction: Secondary | ICD-10-CM | POA: Diagnosis not present

## 2022-11-05 LAB — CBC
HCT: 26.8 % — ABNORMAL LOW (ref 39.0–52.0)
Hemoglobin: 8.7 g/dL — ABNORMAL LOW (ref 13.0–17.0)
MCH: 31.4 pg (ref 26.0–34.0)
MCHC: 32.5 g/dL (ref 30.0–36.0)
MCV: 96.8 fL (ref 80.0–100.0)
Platelets: 104 10*3/uL — ABNORMAL LOW (ref 150–400)
RBC: 2.77 MIL/uL — ABNORMAL LOW (ref 4.22–5.81)
RDW: 22 % — ABNORMAL HIGH (ref 11.5–15.5)
WBC: 8.2 10*3/uL (ref 4.0–10.5)
nRBC: 0 % (ref 0.0–0.2)

## 2022-11-05 LAB — BASIC METABOLIC PANEL
Anion gap: 8 (ref 5–15)
BUN: 16 mg/dL (ref 6–20)
CO2: 26 mmol/L (ref 22–32)
Calcium: 8.8 mg/dL — ABNORMAL LOW (ref 8.9–10.3)
Chloride: 107 mmol/L (ref 98–111)
Creatinine, Ser: 0.77 mg/dL (ref 0.61–1.24)
GFR, Estimated: >60 mL/min (ref >60)
Glucose, Bld: 145 mg/dL — ABNORMAL HIGH (ref 70–99)
Potassium: 4.1 mmol/L (ref 3.5–5.1)
Sodium: 141 mmol/L (ref 135–145)

## 2022-11-05 MED ORDER — FUROSEMIDE 10 MG/ML IJ SOLN
40.0000 mg | Freq: Once | INTRAMUSCULAR | Status: AC
  Administered 2022-11-05: 40 mg via INTRAVENOUS
  Filled 2022-11-05: qty 4

## 2022-11-05 MED ORDER — FUROSEMIDE 10 MG/ML IJ SOLN
80.0000 mg | Freq: Two times a day (BID) | INTRAMUSCULAR | Status: DC
  Administered 2022-11-05: 80 mg via INTRAVENOUS
  Filled 2022-11-05 (×2): qty 8

## 2022-11-05 MED ORDER — HYDROMORPHONE HCL 1 MG/ML IJ SOLN
0.5000 mg | INTRAMUSCULAR | Status: DC | PRN
  Administered 2022-11-06: 0.5 mg via INTRAVENOUS
  Filled 2022-11-05: qty 1

## 2022-11-05 NOTE — Progress Notes (Signed)
Progress Note   [name] WUJ:[id number] DOB: 1971/04/23 DOA: 10/21/2022     15 DOS: the patient was seen and examined on 11/05/2022   Brief hospital course: Ethan Wells is a 51 y.o. male with medical history significant of polysubstance abuse, coronary artery disease, anemia due to hemmoroids, QT prolongation chronic pain syndrome, who presents to the ER today on account of generalized body aches and pains.  At the ER he was found to have potassium of 2.2 subsequently started on IV potassium.  Patient is a poor historian. During my encounter patient was complaining of pain in his arm due to the IV potassium. He endorses recent use of cocaine and alcohol.   Labs concerning for hyponatremia sodium 133, potassium 2.2, magnesium 1.7, hemoglobin 8.8.   Chest x-ray shows no pneumonia, pneumothorax or pleural effusion.   EKG shows no acute ST-T wave changes, first 2 sets of high-sensitivity troponins were 13, 13.  Patient was started on a trial of Solu-Medrol to see if this helps out his pains.  ANA negative. Rheumatoid factor slightly elevated.  Dr. Lucky Cowboy did a angiogram and placed a stent in the superior mesenteric artery.  Patient's epigastric pain improved after this.  Patient on aspirin.  Patient complaining of rectal bleeding.  Reviewed recent colonoscopy and flexible sigmoidoscopy and the patient does have some bleeding hemorrhoids.  Anusol ordered.  Dr. Hampton Abbot did a hemorrhoidal procedure on 10/28/2022.  On 10/29/2022, the patient had some chest pain and then developed shortness of breath.  EKGs were done which showed nonspecific changes.  Troponins pending.  Chest x-ray looks like pulmonary vascular congestion and 60 mg of Lasix was given.  The patient was started on BiPAP and transferred to the stepdown unit.  We will give another dose of IV Lasix this evening.  Reviewed recent echocardiogram with an EF of 60%.  Case discussed with Dr. Saunders Revel cardiology.  Aspirin  restarted.  11/15: Hemodynamically stable on 2 L this morning.  Troponin peaked at 1607, CTA was done for concern of PE and it was negative for PE, did have some finding consistent with pulmonary hypertension and bilateral groundglass opacities with mild pleural effusion. He was placed on heparin infusion, most likely will need cardiac cath based on his cardiac history but cardiology would like to give him a DAPT challenge before due to recent GI bleed. Hemoglobin currently stable around 8.4.  BNP elevated at 1049. Patient continued to have mild intermittent hematochezia, per surgery it is common after hemorrhoidectomy.  11/16: Hemodynamically stable, patient had bright red blood per rectum was using commode for BM earlier this morning, proximately 200 mL of blood seen by nursing staff, also had 10 out of 10 rectal pain.  Surgical site probably patient was made n.p.o. as they will take him back to the OR for examination under anesthesia.  Hemoglobin dropped to from 8.4 yesterday. He was found to have 2 bleeding pedicles which were ligated by general surgery in the OR.  11/17: Patient with no more active bleeding, hemoglobin dropped to 7.7.  Continued to have some rectal pain.  Ordered 1 unit of PRBC due to recent ACS. Cardiology to do cardiac cath on Monday, If hemoglobin remained stable.  11/18: Hemodynamically stable.  Had another small bleeding per rectum last night so heparin infusion was discontinued.  Received 1 unit of PRBC with hemoglobin of 7.3, hemoglobin this morning 9.6, with slight increase in leukocytosis to 12.5. Surgery scheduled his Tylenol and added Celebrex to see  if that will help controlling his pain.  Patient should avoid straining during defecation.  11/19: Patient continued to have mild lower GI bleeding with bowel movement.  Surgery does not have any more to offer at this time, stating that this much bleeding was anticipated however while after hemorrhoidectomy.  Hemoglobin  slowly trending down, at 8.8 today.  Most likely will get his cardiac cath tomorrow.  Keep holding heparin.  11/20: Vitals stable.  Labs seems stable with resolution of leukocytosis, hemoglobin stable around 8.9.  Cardiac catheterization today with patent prior PCI but found to have new stenosis at mid left circumflex and mid right coronary artery with collaterals.  No intervention was done.  They can attempt intervention and other times once his GI bleeding issue has been completely resolved as he cannot take DAPT at this time.  They are recommending medical management.  11/21: Patient remained stable, hemoglobin stable at 8.7.  Intermittently having small bleedings with defecation.  Cardiology would like to continue with IV diuresis for 1-2 more days as he was having elevated pressures on cath.  Assessment and Plan: * Acute on chronic diastolic CHF (congestive heart failure) ([place]) Patient with some chest pain and shortness of breath today.  EKG shows nonspecific ST-T wave changes.  Chest x-ray consistent with heart failure.  Patient given 60 mg of IV Lasix will repeat in the afternoon.  Troponins elevated, BNP at 1049.  Recent echocardiogram with normal EF and no regional wall motion abnormalities. - Cardiology was  consultation.  Restarted aspirin.   -Continue with IV Lasix 40 mg daily -Daily weight and BMP -Strict intake and output  Acute respiratory distress Secondary to heart failure.  Requiring BiPAP overnight, not transition back to baseline oxygen requirement of 2 L. -Continue supplemental oxygen -Continue with BiPAP as needed  Hemorrhoids, internal, with bleeding Dr. Hampton Abbot took to the operating room for hemorrhoid procedure.  Patient had another significant bleed this morning and was taken to the OR again and found to have 2 bleeding pedicles which were ligated. Hemoglobin 8.8 s/p 1 unit PRBC.  Had a small amount of bleeding during defecation.  Heparin was stopped -Patient should  avoid straining -Starting him on iron supplement -Transfuse if below 8   Mesenteric artery stenosis (HCC) Mesenteric ischemia.  Dr. Lucky Cowboy placed a stent in the SMA on 10/24/2022.  Restarted aspirin.   Arterial ultrasound negative for pseudoaneurysm.  NSTEMI (non-ST elevated myocardial infarction) [facility]) Cardiac enzymes negative on admission.  Patient had a recent stress test that was a low risk scan.  Test was consistent with prior myocardial infarction.  No ischemia seen.  Repeat chest pain  which also resulted in elevated troponin which peaked at 1600, concern of NSTEMI.  He was started on heparin infusion but it was discontinued again today due to concern of bleeding.  We will avoid heparin at this time.  No chest pain -Cardiac catheterization with new stenosis of mid left circumflex and mid RCA, no intervention was done and it will be planned later as an outpatient once GI bleeding issue completely resolved. Cardiology is recommending medical management -Continue with supportive care -Appreciate cardiology help  Epigastric pain Seems to improved after placing a stent in the SMA.  Also reviewed endoscopy from 05/30/2022 showing esophagitis and portal hypertensive gastropathy and duodenitis.  We will continue empiric PPI.  CAD (coronary artery disease) On Crestor, restarted aspirin.  Hypokalemia Resolved  Hyperkalemia Resolved.  Elevated rheumatoid factor Will refer to rheumatology as outpatient.  Patient  not the best candidate for methotrexate with history of drinking.  We will continue low-dose steroids for now.  Iron deficiency anemia Last hemoglobin 8.8.  IV iron was given in the hospital 10/25/2022 and 10/27/22. -Starting him on iron supplement  Prolonged QT interval Monitoring on telemetry  Alcohol dependence ([place]) No signs of withdrawal.  Continue folate thiamine.   Subjective: Patient continued to have small amount of rectal bleed with defecation.  No shortness of breath  or chest pain.  Physical Exam: Vitals:   11/04/22 2257 11/05/22 0400 11/05/22 0749 11/05/22 1230  BP: 107/64 108/62 (!) 96/50 (!) 82/51  Pulse: 81 84 82 76  Resp: '18 18 14 16  '$ Temp: 97.9 F (36.6 C) 97.7 F (36.5 C) 98.3 F (36.8 C) 97.9 F (36.6 C)  TempSrc: Oral Oral Oral   SpO2: 100% 96% 97% 97%  Weight:      Height:       General.  Obese gentleman, in no acute distress. Pulmonary.  Lungs clear bilaterally, normal respiratory effort. CV.  Regular rate and rhythm, no JVD, rub or murmur. Abdomen.  Soft, nontender, nondistended, BS positive. CNS.  Alert and oriented .  No focal neurologic deficit. Extremities.  No edema, no cyanosis, pulses intact and symmetrical. Psychiatry.  Judgment and insight appears normal.   Data Reviewed: Prior data reviewed  Family Communication: Discussed with patient  Disposition: Status is: Inpatient Remains inpatient appropriate because: Severity of illness  Planned Discharge Destination: Home  Prophylaxis.  Heparin fusion Time spent: 42 minutes  This record has been created using Systems analyst. Errors have been sought and corrected,but may not always be located. Such creation errors do not reflect on the standard of care.   Author: Lorella Nimrod, MD 11/05/2022 1:14 PM  For on call review [email or web address].

## 2022-11-05 NOTE — Progress Notes (Signed)
Cardiology Progress Note   Patient Name: Ethan Wells Date of Encounter: 11/05/2022  Primary Cardiologist: Lauree Chandler, MD  Subjective   No chest pain or sob.  Says that he feels 'rough,' in the setting of ongoing rectal bleeding and discomfort.  Inpatient Medications    Scheduled Meds:  acetaminophen  1,000 mg Oral Q6H   aspirin EC  81 mg Oral Daily   calcium-vitamin D  1 tablet Oral BID   carvedilol  3.125 mg Oral BID WC   celecoxib  200 mg Oral BID   Chlorhexidine Gluconate Cloth  6 each Topical Daily   ferrous sulfate  325 mg Oral BID WC   folic acid  1 mg Oral Daily   furosemide  40 mg Intravenous Once   furosemide  80 mg Intravenous BID   gabapentin  300 mg Oral TID   hydrocortisone   Rectal BID   isosorbide mononitrate  15 mg Oral Daily   magnesium oxide  400 mg Oral BID   multivitamin with minerals  1 tablet Oral Daily   nicotine  14 mg Transdermal Daily   pantoprazole  40 mg Oral BID   polyethylene glycol  17 g Oral Daily   predniSONE  20 mg Oral Q breakfast   psyllium  1 packet Oral Daily   rosuvastatin  40 mg Oral Daily   sodium chloride flush  3 mL Intravenous Q12H   thiamine  100 mg Oral Daily   Continuous Infusions:  sodium chloride     PRN Meds: sodium chloride, HYDROmorphone (DILAUDID) injection, ipratropium, lidocaine, methocarbamol, nitroGLYCERIN, oxyCODONE, sodium chloride flush   Vital Signs    Vitals:   11/04/22 2106 11/04/22 2257 11/05/22 0400 11/05/22 0749  BP: 101/62 107/64 108/62 (!) 96/50  Pulse: 79 81 84 82  Resp: '19 18 18 14  '$ Temp: 98.9 F (37.2 C) 97.9 F (36.6 C) 97.7 F (36.5 C) 98.3 F (36.8 C)  TempSrc: Oral Oral Oral Oral  SpO2: 100% 100% 96% 97%  Weight:      Height:        Intake/Output Summary (Last 24 hours) at 11/05/2022 0946 Last data filed at 11/04/2022 2328 Gross per 24 hour  Intake 1200 ml  Output 3210 ml  Net -2010 ml   Filed Weights   10/22/22 0252 11/01/22 0719 11/04/22 0345   Weight: 84.5 kg 89.6 kg 90.4 kg    Physical Exam   GEN: Well nourished, well developed, in no acute distress.  HEENT: Grossly normal.  Neck: Supple, obese, difficult to gauge JVP.  No bruits.   Cardiac: RRR, no murmurs, rubs, or gallops. No clubbing, cyanosis, edema.  Radials 2+, DP/PT 2+ and equal bilaterally.  Right radial site without bleeding, bruit, or hematoma. Respiratory:  Respirations regular and unlabored, scattered rhonchi. GI: Obese, soft, nontender, nondistended, BS + x 4. MS: no deformity or atrophy. Skin: warm and dry, no rash. Neuro:  Strength and sensation are intact. Psych: AAOx3.  Normal affect.  Labs    Chemistry Recent Labs  Lab 11/03/22 0353 11/04/22 0432 11/05/22 0423  NA 137 140 141  K 4.3 4.8 4.1  CL 105 107 107  CO2 '26 25 26  '$ GLUCOSE 116* 114* 145*  BUN '15 13 16  '$ CREATININE 0.64 0.77 0.77  CALCIUM 9.1 9.1 8.8*  GFRNONAA >60 >60 >60  ANIONGAP '6 8 8     '$ Hematology Recent Labs  Lab 11/03/22 1028 11/04/22 0432 11/05/22 0423  WBC 11.0* 10.5 8.2  RBC 2.85*  2.93* 2.77*  HGB 8.8* 8.9* 8.7*  HCT 27.4* 28.3* 26.8*  MCV 96.1 96.6 96.8  MCH 30.9 30.4 31.4  MCHC 32.1 31.4 32.5  RDW 22.5* 22.2* 22.0*  PLT 113* 110* 104*    Cardiac Enzymes  Recent Labs  Lab 10/21/22 2131 10/29/22 1051 10/29/22 1204 10/29/22 1433 10/29/22 1636  TROPONINIHS 13 15 241* 1,607* 1,418*      BNP    Component Value Date/Time   BNP 1,049.3 (H) 10/29/2022 1433    Lipids  Lab Results  Component Value Date   CHOL 120 09/30/2022   HDL 20 (L) 09/30/2022   LDLCALC 87 09/30/2022   TRIG 64 09/30/2022   CHOLHDL 6.0 09/30/2022    HbA1c  Lab Results  Component Value Date   HGBA1C 4.7 (L) 10/01/2022    Radiology    ------------------  Telemetry    RSR, PACs - Personally Reviewed  Cardiac Studies   2D Echocardiogram 11.15.2023   1. Left ventricular ejection fraction, by estimation, is 55 to 60%. The  left ventricle has normal function. The left  ventricle has no regional  wall motion abnormalities.   2. Right ventricular systolic function is normal. The right ventricular  size is normal.   3. The mitral valve is normal in structure. No evidence of mitral valve  regurgitation.   4. The aortic valve is normal in structure. Aortic valve regurgitation is  not visualized.   5. The inferior vena cava is normal in size with greater than 50%  respiratory variability, suggesting right atrial pressure of 3 mmHg. _____________   Cardiac Catheterization  11.20.2023  Diagnostic Dominance: Right  EF 45-50%  1.  Severe two-vessel coronary artery disease with chronically occluded mid left circumflex and mid right coronary artery with collaterals.  Distal vessels were patent with mild disease on previous cardiac catheterization in 2012.  LAD stent is patent with minimal restenosis.  In addition, there is progression of proximal to mid ramus stenosis. 2.  Mildly reduced LV systolic function with an EF of 45%. 3.  Moderately elevated left ventricular end-diastolic pressure at 26 mmHg.   Recommendations: The patient has 2 chronic occlusions with collaterals.  Recommend medical therapy for now.  I added small dose Imdur.  Given ongoing rectal bleeding, risks of PCI outweigh the benefit at this time.  In the near future, once his bleeding issues are controlled and if he continues to have anginal symptoms, PCI of the ramus and possibly the right coronary artery can be considered.  The RCA has a microchannel of patent flow. Continue IV furosemide today given elevated LVEDP and switch to oral likely tomorrow. _____________   Patient Profile     51 year old male with history of CAD/PCI to LAD 2012, cocaine use, presenting with chest pain, shortness of breath, rectal bleeding secondary to hemorrhoid s/p hemorrhoidectomy, recent SMA stenting, rectal bleeding & abl anemia req PRBCs.  Being seen for elevated troponins, peaked at 1600.  Cath November 20 with  patent LAD stent, occluded RCA and circumflex, and severe ramus disease.  Assessment & Plan    1.  Non-STEMI/CAD:  Status post prior PCI to the LAD.  Presented with chest pain and rectal bleeding on November 6.  Troponin up to 1607, likely secondary to demand ischemia in the setting of flash pulmonary edema/ischemic cardiomyopathy.   Diagnostic catheterization November 20 with occluded RCA and circumflex, patent LAD stent, and severe ramus intermedius stenosis.  He has left to left and left-to-right collaterals to the circumflex  distribution and RCA distributions respectively.  Initial recommendation for medical therapy and low-dose isosorbide mononitrate added.  Once bleeding issues have resolved, and if he continues to have angina, PCI of the ramus and possibly right coronary artery could be considered.  He continues to have small-volume rectal bleeding and remains on aspirin only.  Otherwise, continue beta-blocker, nitrate, and statin therapy.  2.  Rectal bleeding/hemorrhoids: Status post hemorrhoidectomy with repeat ligation November 16.  Continues to have low-volume rectal bleeding with stable H&H.  He is on low-dose aspirin therapy in the setting of above.  Ongoing rectal pain.  Management per internal medicine and surgical team.  3.  Acute blood loss anemia: In the setting of #2.  H&H stable.  4.  Essential hypertension: Blood pressure relatively soft on beta-blocker and nitrate therapy.  5.  Hyperlipidemia: LDL of 87 in October.  Not high potency statin.  6.  Acute HFpEF/pulmonary edema: EF 55 to 60% by echo November 15 with normal RV function no significant valvular disease.  LVEDP elevated at 29 mmHg on diagnostic catheterization.  We have escalated Lasix 80 mg IV twice daily.  He was -2.3 L yesterday.  Current weight significantly higher than previous weights between June and early November.  Heart rate and blood pressure stable.  7.  Polysubstance use: Smokes cigarettes, uses alcohol, and  had positive tox screen for cocaine in October.  Complete cessation advised.  8.  Peripheral arterial disease: Status post SMA stenting.  On aspirin monotherapy in the setting of rectal bleeding.  Continue statin.  Managed by Dr. Lucky Cowboy.   Signed, Murray Hodgkins, NP  11/05/2022, 9:46 AM    For questions or updates, please contact   Please consult [email or web address] for contact info under Cardiology/STEMI.

## 2022-11-05 NOTE — Care Management Important Message (Signed)
Important Message  Patient Details  Name: Ethan Wells MRN: [id number] Date of Birth: Jun 21, 1971   Medicare Important Message Given:  Yes     [name] 11/05/2022, 12:01 PM

## 2022-11-06 ENCOUNTER — Telehealth: Payer: Self-pay | Admitting: Adult Health

## 2022-11-06 DIAGNOSIS — I5033 Acute on chronic diastolic (congestive) heart failure: Secondary | ICD-10-CM | POA: Diagnosis not present

## 2022-11-06 LAB — CBC
HCT: 26.7 % — ABNORMAL LOW (ref 39.0–52.0)
Hemoglobin: 8.7 g/dL — ABNORMAL LOW (ref 13.0–17.0)
MCH: 30.9 pg (ref 26.0–34.0)
MCHC: 32.6 g/dL (ref 30.0–36.0)
MCV: 94.7 fL (ref 80.0–100.0)
Platelets: 110 10*3/uL — ABNORMAL LOW (ref 150–400)
RBC: 2.82 MIL/uL — ABNORMAL LOW (ref 4.22–5.81)
RDW: 21.7 % — ABNORMAL HIGH (ref 11.5–15.5)
WBC: 9 10*3/uL (ref 4.0–10.5)
nRBC: 0 % (ref 0.0–0.2)

## 2022-11-06 MED ORDER — VITAMIN B-1 100 MG PO TABS: 100.0000 mg | ORAL_TABLET | Freq: Every day | ORAL | 1 refills | Status: DC

## 2022-11-06 MED ORDER — NICOTINE 14 MG/24HR TD PT24: 14.0000 mg | MEDICATED_PATCH | Freq: Every day | TRANSDERMAL | 0 refills | Status: DC

## 2022-11-06 MED ORDER — ADULT MULTIVITAMIN W/MINERALS CH: 1.0000 | ORAL_TABLET | Freq: Every day | ORAL | 2 refills | Status: DC

## 2022-11-06 MED ORDER — NITROGLYCERIN 0.4 MG SL SUBL: 0.4000 mg | SUBLINGUAL_TABLET | SUBLINGUAL | 12 refills | Status: DC | PRN

## 2022-11-06 MED ORDER — HYDROCORTISONE (PERIANAL) 2.5 % EX CREA: TOPICAL_CREAM | Freq: Two times a day (BID) | CUTANEOUS | 0 refills | Status: AC

## 2022-11-06 MED ORDER — POLYETHYLENE GLYCOL 3350 17 G PO PACK: 17.0000 g | PACK | Freq: Every day | ORAL | 2 refills | Status: DC

## 2022-11-06 MED ORDER — THERA VITAL M PO TABS: 1.0000 | ORAL_TABLET | Freq: Every day | ORAL | 2 refills | Status: AC

## 2022-11-06 MED ORDER — POLYETHYLENE GLYCOL 3350 17 G PO PACK: 17.0000 g | PACK | Freq: Every day | ORAL | 0 refills | Status: DC

## 2022-11-06 MED ORDER — ADULT MULTIVITAMIN W/MINERALS CH: 1.0000 | ORAL_TABLET | Freq: Every day | ORAL | Status: DC

## 2022-11-06 MED ORDER — FUROSEMIDE 40 MG PO TABS
40.0000 mg | ORAL_TABLET | Freq: Two times a day (BID) | ORAL | Status: DC
  Administered 2022-11-06 – 2022-11-07 (×2): 40 mg via ORAL
  Filled 2022-11-06 (×4): qty 1

## 2022-11-06 MED ORDER — MAGNESIUM OXIDE -MG SUPPLEMENT 400 (240 MG) MG PO TABS: 400.0000 mg | ORAL_TABLET | Freq: Two times a day (BID) | ORAL | 2 refills | Status: DC

## 2022-11-06 MED ORDER — FOLIC ACID 1 MG PO TABS: 1.0000 mg | ORAL_TABLET | Freq: Every day | ORAL | 0 refills | Status: DC

## 2022-11-06 MED ORDER — ISOSORBIDE MONONITRATE ER 30 MG PO TB24: 15.0000 mg | ORAL_TABLET | Freq: Every day | ORAL | 1 refills | Status: DC

## 2022-11-06 MED ORDER — OXYCODONE HCL 5 MG PO TABS
5.0000 mg | ORAL_TABLET | Freq: Four times a day (QID) | ORAL | Status: DC | PRN
  Administered 2022-11-06 – 2022-11-08 (×6): 5 mg via ORAL
  Filled 2022-11-06 (×7): qty 1

## 2022-11-06 MED ORDER — ASPIRIN 81 MG PO CHEW: 81.0000 mg | CHEWABLE_TABLET | Freq: Every day | ORAL | 2 refills | Status: DC

## 2022-11-06 MED ORDER — FERROUS SULFATE 325 (65 FE) MG PO TABS: 325.0000 mg | ORAL_TABLET | Freq: Every day | ORAL | 2 refills | Status: DC

## 2022-11-06 MED ORDER — FUROSEMIDE 40 MG PO TABS: 40.0000 mg | ORAL_TABLET | Freq: Two times a day (BID) | ORAL | 2 refills | Status: DC

## 2022-11-06 MED ORDER — CARVEDILOL 3.125 MG PO TABS: 3.1250 mg | ORAL_TABLET | Freq: Two times a day (BID) | ORAL | 1 refills | Status: DC

## 2022-11-06 MED ORDER — PSYLLIUM 95 % PO PACK: 1.0000 | PACK | Freq: Every day | ORAL | 2 refills | Status: DC

## 2022-11-06 MED ORDER — PSYLLIUM 95 % PO PACK: 1.0000 | PACK | Freq: Every day | ORAL | 1 refills | Status: DC

## 2022-11-06 NOTE — Discharge Summary (Signed)
Physician Discharge Summary   Ethan Wells  male DOB: 01/10/1971  IWL:[id number]  PCP: Nickola Major, NP  Admit date: 10/21/2022 Discharge date: 11/06/2022  Admitted From: home Disposition:  home CODE STATUS: Full code  Discharge Instructions     AMB Referral to Cardiac Rehabilitation - Phase II   Complete by: As directed    Diagnosis: NSTEMI   After initial evaluation and assessments completed: Virtual Based Care may be provided alone or in conjunction with Phase 2 Cardiac Rehab based on patient barriers.: Yes   Intensive Cardiac Rehabilitation ([place]) [place] location only OR Traditional Cardiac Rehabilitation (TCR) *If criteria for ICR are not met will enroll in TCR [facility] only): Yes   Call MD for:  difficulty breathing, headache or visual disturbances   Complete by: As directed    Call MD for:  persistant nausea and vomiting   Complete by: As directed    Call MD for:  redness, tenderness, or signs of infection (pain, swelling, redness, odor or green/yellow discharge around incision site)   Complete by: As directed    Call MD for:  severe uncontrolled pain   Complete by: As directed    Call MD for:  temperature >100.4   Complete by: As directed    Change dressing (specify)   Complete by: As directed    Apply fluffed gauze to the perianal area to catch any drainage.  Change daily and as needed.   Discharge instructions   Complete by: As directed    1. Please do Sitz baths twice daily and after bowel movements.  This will help soothe the raw tissue. 2. May start using lidocaine ointment on 10/31/22.  Please do not start it earlier than that as it may cause toxicity. 3. May start taking Miralax once daily to help prevent any constipation. 4. May apply fluffed gauze to the perianal area to catch any drainage.   Driving Restrictions   Complete by: As directed    Do not drive while taking narcotics for pain control.  Prior to driving, make sure you are able to rotate  right and left to look at blindspots without significant pain or discomfort.   Increase activity slowly   Complete by: As directed    No wound care   Complete by: As directed       Hospital Course:  For full details, please see H&P, progress notes, consult notes and ancillary notes.  Briefly,  Ethan Wells is a 51 y.o. male with medical history significant of polysubstance abuse, coronary artery disease, anemia due to hemorrhoids, chronic pain syndrome, who presented to the ER on account of generalized body aches and pains.  He admitted to recent use of cocaine and alcohol.   Patient was started on a trial of Solu-Medrol to see if this helps out his pains.  ANA negative. Rheumatoid factor slightly elevated.   Dr. Lucky Cowboy did a angiogram and placed a stent in the superior mesenteric artery.  Patient's epigastric pain improved after this.     Patient complaining of rectal bleeding.  Dr. Hampton Abbot did a hemorrhoidal procedure on 10/28/2022.  On 11/16, pt had more significant rectal bleeding, so was taken back to OR where 2 bleeding pedicles were ligated.     On 10/29/2022, the patient had some chest pain and then developed shortness of breath.  Chest x-ray with pulmonary vascular congestion.  The patient was started on BiPAP and diuretics.  Troponin peaked at 1607.  Pt received heart cath  on 11/20 when Hgb stable, and found to have new stenosis at mid left circumflex and mid right coronary artery with collaterals.  No intervention was done. Cardio recommending medical management.   * Acute on chronic diastolic CHF (congestive heart failure) ([name]) On 10/29/2022, the patient had some chest pain and then developed shortness of breath.  Chest x-ray with pulmonary vascular congestion.  BNP 1049.  echocardiogram with normal EF and no regional wall motion abnormalities. - Cardiology was  consultation.  Restarted aspirin.  Pt received IV Lasix for diuresis and was transitioned to oral lasix 40 mg BID  prior to discharge. --also started on low-dose Lopressor 12.5 mg BID.     Hemorrhoids, internal, with bleeding hemorrhoidal procedure on 10/28/2022, and on 10/31/22. Patient continued to have mild lower GI bleeding with bowel movement.  Hgb remained stable from 8's-9's.  Surgery does not have any more to offer at this time, stating that this minor bleeding was anticipated however while after hemorrhoidectomy.  Pt wasn't fully compliant with his scheduled Miralax to ensure his stool remains soft.  Advised pt to take Miralax at least once a day.   Mesenteric artery stenosis (HCC) Mesenteric ischemia.  Dr. Lucky Cowboy placed a stent in the SMA on 10/24/2022.  Restarted aspirin.   Arterial ultrasound negative for pseudoaneurysm.   NSTEMI (non-ST elevated myocardial infarction) [facility]) Cardiac enzymes negative on admission.  Had chest pain on 11/14, which also resulted in elevated troponin which peaked at 1600.  He was started on heparin infusion but it was discontinued due to concern of bleeding.   -Cardiac catheterization with new stenosis of mid left circumflex and mid RCA, no intervention was done and it will be planned later as an outpatient once GI bleeding issue completely resolved. Cardiology is recommending medical management. --pt was started on coreg which was later changed to Lopressor due to low BP.  Pt was started on Imdur which was also later held due to low BP.  Iron deficiency anemia Folate def Acute blood loss anemia --received 1u pRBC due to cardiac issues.  IV iron was given in the hospital 10/25/2022 and 10/27/22.  Folate low at 3.3.  Hgb remained stable 8's-9's after 2nd hemorrhoids surgery. --started pt on iron and folic acid supplements.  Epigastric pain Seems to improved after placing a stent in the SMA.  Also reviewed endoscopy from 05/30/2022 showing esophagitis and portal hypertensive gastropathy and duodenitis.  --cont PPI   CAD (coronary artery disease) --cont ASA and  statin   Hypokalemia Resolved   Hyperkalemia Resolved.   Elevated rheumatoid factor Will refer to rheumatology as outpatient.     Alcohol dependence ([place]) No signs of withdrawal.  Continue folate thiamine.  Current smoker --nicotine patch   Discharge Diagnoses:  Principal Problem:   Acute on chronic heart failure with preserved ejection fraction (HFpEF) (HCC) Active Problems:   Acute respiratory distress   Hemorrhoids, internal, with bleeding   Mesenteric artery stenosis (HCC)   NSTEMI (non-ST elevated myocardial infarction) ([place])   Epigastric pain   CAD (coronary artery disease)   Hypokalemia   Hyperkalemia   Essential hypertension   Alcohol dependence (HCC)   Blood loss anemia   Prolonged QT interval   Iron deficiency anemia   Elevated rheumatoid factor   Demand ischemia   30 Day Unplanned Readmission Risk Score    Flowsheet Row ED to Hosp-Admission (Current) from 10/21/2022 in [facility]  30 Day Unplanned Readmission Risk Score (%) 30.09 Filed at 11/06/2022  0801       This score is the patient's risk of an unplanned readmission within 30 days of being discharged (0 -100%). The score is based on dignosis, age, lab data, medications, orders, and past utilization.   Low:  0-14.9   Medium: 15-21.9   High: 22-29.9   Extreme: 30 and above         Discharge Instructions:  Allergies as of 11/08/2022       Reactions   Penicillins Hives   Tolerated Ceftriaxone 10/2022   Other Rash   chives        Medication List     STOP taking these medications    diltiazem 30 MG tablet Commonly known as: CARDIZEM       TAKE these medications    acetaminophen 500 MG tablet Commonly known as: TYLENOL Take 2 tablets (1,000 mg total) by mouth every 6 (six) hours as needed for mild pain.   aspirin 81 MG chewable tablet Chew 1 tablet (81 mg total) by mouth daily.   calcium-vitamin D 500-5 MG-MCG tablet Commonly known as: OSCAL WITH  D Take 1 tablet by mouth 2 (two) times daily.   ezetimibe 10 MG tablet Commonly known as: ZETIA Take 10 mg by mouth daily.   ferrous sulfate 325 (65 FE) MG tablet Take 1 tablet (325 mg total) by mouth daily with breakfast.   folic acid 1 MG tablet Commonly known as: FOLVITE Take 1 tablet (1 mg total) by mouth daily.   furosemide 40 MG tablet Commonly known as: LASIX Take 1 tablet (40 mg total) by mouth 2 (two) times daily.   gabapentin 300 MG capsule Commonly known as: NEURONTIN Take 1 capsule (300 mg total) by mouth 3 (three) times daily.   hydrocortisone 2.5 % rectal cream Commonly known as: ANUSOL-HC Place rectally 2 (two) times daily for 10 days.   ibuprofen 800 MG tablet Commonly known as: ADVIL Take 1 tablet (800 mg total) by mouth every 8 (eight) hours as needed for moderate pain.   lidocaine 5 % ointment Commonly known as: XYLOCAINE Apply 1 Application topically 4 (four) times daily as needed. Apply to the perianal area   magnesium oxide 400 (240 Mg) MG tablet Commonly known as: MAG-OX Take 1 tablet (400 mg total) by mouth 2 (two) times daily.   methocarbamol 750 MG tablet Commonly known as: Robaxin-750 Take 1 tablet (750 mg total) by mouth daily as needed for muscle spasms.   metoprolol tartrate 25 MG tablet Commonly known as: LOPRESSOR Take 0.5 tablets (12.5 mg total) by mouth 2 (two) times daily.   multivitamin tablet Take 1 tablet by mouth daily.   nicotine 14 mg/24hr patch Commonly known as: NICODERM CQ - dosed in mg/24 hours Place 1 patch (14 mg total) onto the skin daily.   nitroGLYCERIN 0.4 MG SL tablet Commonly known as: NITROSTAT Place 1 tablet (0.4 mg total) under the tongue every 5 (five) minutes as needed for chest pain.   pantoprazole 40 MG tablet Commonly known as: PROTONIX Take 1 tablet (40 mg total) by mouth daily.   polyethylene glycol 17 g packet Commonly known as: MIRALAX / GLYCOLAX Take 17 g by mouth daily.   psyllium 95 %  Pack Commonly known as: HYDROCIL/METAMUCIL Take 1 packet by mouth daily.   rosuvastatin 40 MG tablet Commonly known as: CRESTOR Take 1 tablet (40 mg total) by mouth daily.   thiamine 100 MG tablet Commonly known as: Vitamin B-1 Take 1 tablet (100 mg total)  by mouth daily.               Durable Medical Equipment  (From admission, onward)           Start     Ordered   11/06/22 1257  For home use only DME 4 wheeled rolling walker with seat  Once       Question Answer Comment  Patient needs a walker to treat with the following condition Generalized weakness   Patient needs a walker to treat with the following condition Falls frequently      11/06/22 1257              Discharge Care Instructions  (From admission, onward)           Start     Ordered   10/28/22 0000  Change dressing (specify)       Comments: Apply fluffed gauze to the perianal area to catch any drainage.  Change daily and as needed.   10/28/22 1821             Follow-up Information     Ephriam Jenkins K, MD Follow up in 3 week(s).   Specialty: Rheumatology Why: elevated rheumatoid factor Contact information: [place] [address] [phone]         Olean Ree, MD. Go in 3 week(s).   Specialty: General Surgery Why: Appointment on Wednesday, 11/27/2022 at 9:00am. Contact information: [address] [place] [address] [phone]         Medina-Vargas, Senaida Lange, NP. Go in 1 week(s).   Specialty: Internal Medicine Why: Appointment on Monday, 11/11/2022 at 1:40pm. Contact information: [address] [address] [phone]         Wellington Hampshire, MD. Go in 1 month(s).   Specialty: Cardiology Why: Appointment on Friday, 11/22/2022 9:00am. Contact information: [address] [place] [address] [phone]                 Allergies  Allergen Reactions   Penicillins Hives     Tolerated Ceftriaxone 10/2022   Other Rash    chives     The results of significant diagnostics from this hospitalization (including imaging, microbiology, ancillary and laboratory) are listed below for reference.   Consultations:   Procedures/Studies: CARDIAC CATHETERIZATION  Addendum Date: 11/06/2022     Mid Cx lesion is 100% stenosed.   Ramus lesion is 85% stenosed.   Prox LAD to Mid LAD lesion is 5% stenosed.   Prox RCA to Mid RCA lesion is 100% stenosed.   Prox RCA lesion is 80% stenosed.   There is mild left ventricular systolic dysfunction.   LV end diastolic pressure is moderately elevated.   The left ventricular ejection fraction is 45-50% by visual estimate. 1.  Severe two-vessel coronary artery disease with chronically occluded mid left circumflex and mid right coronary artery with collaterals.  These vessels were patent with mild disease on previous cardiac catheterization in 2012.  LAD stent is patent with minimal restenosis.  In addition, there is progression of proximal to mid ramus stenosis. 2.  Mildly reduced LV systolic function with an EF of 45%. 3.  Moderately elevated left ventricular end-diastolic pressure at 26 mmHg. Recommendations: The patient has 2 chronic occlusions with collaterals.  Recommend medical therapy for now.  I added small dose Imdur.  Given ongoing rectal bleeding, risks of PCI outweigh the benefit at this time.  In the near future,  once his bleeding issues are controlled and if he continues to have anginal symptoms, PCI of the ramus and possibly the right coronary artery can be considered.  The RCA has a microchannel of patent flow. Continue IV furosemide today given elevated LVEDP and switch to oral likely tomorrow.  Result Date: 11/06/2022   Mid Cx lesion is 100% stenosed.   Ramus lesion is 85% stenosed.   Prox LAD to Mid LAD lesion is 5% stenosed.   Prox RCA to Mid RCA lesion is 100% stenosed.   Prox RCA lesion is 80% stenosed.   There is mild left  ventricular systolic dysfunction.   LV end diastolic pressure is moderately elevated.   The left ventricular ejection fraction is 45-50% by visual estimate. 1.  Severe two-vessel coronary artery disease with chronically occluded mid left circumflex and mid right coronary artery with collaterals.  Distal vessels were patent with mild disease on previous cardiac catheterization in 2012.  LAD stent is patent with minimal restenosis.  In addition, there is progression of proximal to mid ramus stenosis. 2.  Mildly reduced LV systolic function with an EF of 45%. 3.  Moderately elevated left ventricular end-diastolic pressure at 26 mmHg. Recommendations: The patient has 2 chronic occlusions with collaterals.  Recommend medical therapy for now.  I added small dose Imdur.  Given ongoing rectal bleeding, risks of PCI outweigh the benefit at this time.  In the near future, once his bleeding issues are controlled and if he continues to have anginal symptoms, PCI of the ramus and possibly the right coronary artery can be considered.  The RCA has a microchannel of patent flow. Continue IV furosemide today given elevated LVEDP and switch to oral likely tomorrow.   ECHOCARDIOGRAM LIMITED  Result Date: 10/30/2022    ECHOCARDIOGRAM LIMITED REPORT   Patient Name:   ALEKSEY NEWBERN Date of Exam: 10/30/2022 Medical Rec #:  [id number]           Height:       65.0 in Accession #:    [id number]          Weight:       186.3 lb Date of Birth:  03-25-1971           BSA:          1.919 m Patient Age:    63 years            BP:           112/64 mmHg Patient Gender: M                   HR:           88 bpm. Exam Location:  [facility] Procedure: Limited Echo, Limited Color Doppler, Cardiac Doppler and Intracardiac            Opacification Agent Indications:     I24.9 Acute ischemic heart disease, unspecified  History:         Patient has prior history of Echocardiogram examinations, most                  recent 10/01/2022. Ischemic  Cardiomyopathy, CAD; Risk                  Factors:Hypertension, Dyslipidemia and Current Smoker.  Sonographer:     Charmayne Sheer Referring Phys:  [id number] CHRISTOPHER END Diagnosing Phys: Kate Sable MD  Sonographer Comments: Technically difficult study due to poor echo windows. Image acquisition challenging due to patient  body habitus. IMPRESSIONS  1. Left ventricular ejection fraction, by estimation, is 55 to 60%. The left ventricle has normal function. The left ventricle has no regional wall motion abnormalities.  2. Right ventricular systolic function is normal. The right ventricular size is normal.  3. The mitral valve is normal in structure. No evidence of mitral valve regurgitation.  4. The aortic valve is normal in structure. Aortic valve regurgitation is not visualized.  5. The inferior vena cava is normal in size with greater than 50% respiratory variability, suggesting right atrial pressure of 3 mmHg. FINDINGS  Left Ventricle: Left ventricular ejection fraction, by estimation, is 55 to 60%. The left ventricle has normal function. The left ventricle has no regional wall motion abnormalities. Definity contrast agent was given IV to delineate the left ventricular  endocardial borders. The left ventricular internal cavity size was normal in size. Right Ventricle: The right ventricular size is normal. Right ventricular systolic function is normal. Mitral Valve: The mitral valve is normal in structure. Tricuspid Valve: The tricuspid valve is normal in structure. Tricuspid valve regurgitation is trivial. Aortic Valve: The aortic valve is normal in structure. Aortic valve regurgitation is not visualized. Aorta: The aortic root and ascending aorta are structurally normal, with no evidence of dilitation. Venous: The inferior vena cava is normal in size with greater than 50% respiratory variability, suggesting right atrial pressure of 3 mmHg. Additional Comments: Spectral Doppler performed. Color Doppler performed.   LEFT VENTRICLE PLAX 2D LVIDd:         5.40 cm LVIDs:         3.40 cm LV PW:         1.30 cm LV IVS:        1.00 cm  LEFT ATRIUM         Index LA diam:    4.90 cm 2.55 cm/m Kate Sable MD Electronically signed by Kate Sable MD Signature Date/Time: 10/30/2022/9:51:06 AM    Final    CT Angio Chest Pulmonary Embolism (PE) W or WO Contrast  Result Date: 10/29/2022 CLINICAL DATA:  Pulmonary embolism (PE) suspected, low to intermediate prob, positive D-dimer EXAM: CT ANGIOGRAPHY CHEST WITH CONTRAST TECHNIQUE: Multidetector CT imaging of the chest was performed using the standard protocol during bolus administration of intravenous contrast. Multiplanar CT image reconstructions and MIPs were obtained to evaluate the vascular anatomy. RADIATION DOSE REDUCTION: This exam was performed according to the departmental dose-optimization program which includes automated exposure control, adjustment of the mA and/or kV according to patient size and/or use of iterative reconstruction technique. CONTRAST:  75m OMNIPAQUE IOHEXOL 350 MG/ML SOLN COMPARISON:  Chest x-ray 10/29/2022, CT angio chest 09/30/2022 FINDINGS: Cardiovascular: Satisfactory opacification of the pulmonary arteries to the segmental level. No evidence of pulmonary embolism. The main pulmonary artery is enlarged measuring up to 3.6 cm. Normal heart size. No significant pericardial effusion. The thoracic aorta is normal in caliber. Mild atherosclerotic plaque of the thoracic aorta. At least 2 vessel coronary artery calcifications. Mediastinum/Nodes: No enlarged mediastinal, hilar, or axillary lymph nodes. Thyroid gland, trachea, and esophagus demonstrate no significant findings. Lungs/Pleura: Peribronchovascular ground-glass airspace opacities scattered throughout the lungs and slightly more prominent in the upper lobes. No pulmonary nodule. No pulmonary mass. Trace bilateral pleural effusions. No pneumothorax. Upper Abdomen: Splenomegaly. Query  pericholecystic fluid and gallbladder wall thickening Musculoskeletal: No chest wall abnormality. No suspicious lytic or blastic osseous lesions. No acute displaced fracture. Review of the MIP images confirms the above findings. IMPRESSION: 1. No pulmonary embolus. 2.  Enlarged main pulmonary artery suggestive of pulmonary hypertension. 3. Diffuse peribronchovascular ground-glass airspace opacities suggestive of infection/inflammation. Consider repeat CT chest in 3 months to evaluate for resolution. 4. Trace bilateral pleural effusions. 5. Splenomegaly. 6. Query pericholecystic fluid and gallbladder wall thickening. Correlate with liver function-if clinically indicated consider right upper quadrant ultrasound. Electronically Signed   By: Iven Finn M.D.   On: 10/29/2022 17:15   DG Chest Port 1 View  Result Date: 10/29/2022 CLINICAL DATA:  Shortness of breath EXAM: PORTABLE CHEST 1 VIEW COMPARISON:  Chest x-ray dated October 21, 2022 FINDINGS: Unchanged cardiomegaly. New diffuse interstitial opacities. No large pleural effusion or evidence of pneumothorax. IMPRESSION: New diffuse interstitial opacities, compatible with pulmonary edema. Electronically Signed   By: Yetta Glassman M.D.   On: 10/29/2022 11:02   [place] Lower Ext Art Right Ltd  Result Date: 10/26/2022 CLINICAL DATA:  Groin pain 3 days post visceral arteriogram EXAM: RIGHT LOWER EXTREMITY ARTERIAL DUPLEX SCAN TECHNIQUE: Gray-scale sonography as well as color Doppler and duplex ultrasound was performed to evaluate the right lower extremity arteries including the common, superficial and profunda femoral arteries, popliteal artery and calf arteries. COMPARISON:  CT 09/30/2022 FINDINGS: Normal waveforms in the common and superficial femoral arteries. Normal color Doppler signal and common, deep, and superficial femoral arteries. No evidence of pseudoaneurysm. Normal venous color signal and waveforms in the right common femoral vein, femoral vein,  and GSV. No evidence of AV fistula. No regional hematoma. IMPRESSION: Negative. Electronically Signed   By: Lucrezia Europe M.D.   On: 10/26/2022 20:00   PERIPHERAL VASCULAR CATHETERIZATION  Result Date: 10/24/2022 See surgical note for result.  DG Chest Port 1 View  Result Date: 10/21/2022 CLINICAL DATA:  Chest pain EXAM: PORTABLE CHEST 1 VIEW COMPARISON:  09/30/2022 FINDINGS: Lungs are clear.  No pleural effusion or pneumothorax. The heart is top-normal in size. IMPRESSION: No evidence of acute cardiopulmonary disease. Electronically Signed   By: Julian Hy M.D.   On: 10/21/2022 20:38      Labs: BNP (last 3 results) Recent Labs    09/30/22 1155 10/29/22 1433  BNP 187.2* 6,314.9*   Basic Metabolic Panel: Recent Labs  Lab 11/01/22 0904 11/02/22 0215 11/03/22 0353 11/04/22 0432 11/05/22 0423  NA 135 139 137 140 141  K 4.0 4.9 4.3 4.8 4.1  CL 102 108 105 107 107  CO2 _0 GLUCOSE 174* 117* 116* 114* 145*  BUN _1 CREATININE 0.69 0.63 0.64 0.77 0.77  CALCIUM 8.5* 8.9 9.1 9.1 8.8*   Liver Function Tests: No results for input(s): "AST", "ALT", "ALKPHOS", "BILITOT", "PROT", "ALBUMIN" in the last 168 hours. No results for input(s): "LIPASE", "AMYLASE" in the last 168 hours. No results for input(s): "AMMONIA" in the last 168 hours. CBC: Recent Labs  Lab 11/03/22 1028 11/04/22 0432 11/05/22 0423 11/06/22 0320 11/07/22 0418  WBC 11.0* 10.5 8.2 9.0 7.0  HGB 8.8* 8.9* 8.7* 8.7* 9.5*  HCT 27.4* 28.3* 26.8* 26.7* 30.0*  MCV 96.1 96.6 96.8 94.7 95.5  PLT 113* 110* 104* 110* 124*   Cardiac Enzymes: No results for input(s): "CKTOTAL", "CKMB", "CKMBINDEX", "TROPONINI" in the last 168 hours. BNP: Invalid input(s): "POCBNP" CBG: No results for input(s): "GLUCAP" in the last 168 hours. D-Dimer No results for input(s): "DDIMER" in the last 72 hours. Hgb A1c No results for input(s): "HGBA1C" in the last 72 hours. Lipid Profile No results for  input(s): "CHOL", "HDL", "LDLCALC", "TRIG", "CHOLHDL", "LDLDIRECT" in  the last 72 hours. Thyroid function studies No results for input(s): "TSH", "T4TOTAL", "T3FREE", "THYROIDAB" in the last 72 hours.  Invalid input(s): "FREET3" Anemia work up No results for input(s): "VITAMINB12", "FOLATE", "FERRITIN", "TIBC", "IRON", "RETICCTPCT" in the last 72 hours. Urinalysis    Component Value Date/Time   COLORURINE STRAW (A) 09/30/2022 2225   APPEARANCEUR CLEAR 09/30/2022 2225   APPEARANCEUR Clear 03/16/2012 1315   LABSPEC 1.012 09/30/2022 2225   LABSPEC 1.013 03/16/2012 1315   PHURINE 9.0 (H) 09/30/2022 2225   GLUCOSEU NEGATIVE 09/30/2022 2225   GLUCOSEU 50 mg/dL 03/16/2012 1315   HGBUR NEGATIVE 09/30/2022 [place] 09/30/2022 2225   BILIRUBINUR Negative 03/16/2012 1315   KETONESUR NEGATIVE 09/30/2022 2225   PROTEINUR NEGATIVE 09/30/2022 2225   NITRITE NEGATIVE 09/30/2022 [place] 09/30/2022 2225   LEUKOCYTESUR Negative 03/16/2012 1315   Sepsis Labs Recent Labs  Lab 11/04/22 0432 11/05/22 0423 11/06/22 0320 11/07/22 0418  WBC 10.5 8.2 9.0 7.0   Microbiology Recent Results (from the past 240 hour(s))  MRSA Next Gen by PCR, Nasal     Status: None   Collection Time: 10/30/22  9:27 PM   Specimen: Nasal Mucosa; Nasal Swab  Result Value Ref Range Status   MRSA by PCR Next Gen NOT DETECTED NOT DETECTED Final    Comment: (NOTE) The GeneXpert MRSA Assay (FDA approved for NASAL specimens only), is one component of a comprehensive MRSA colonization surveillance program. It is not intended to diagnose MRSA infection nor to guide or monitor treatment for MRSA infections. Test performance is not FDA approved in patients less than 56 years old. Performed at [facility], [address] [place], [place] [address]      Total time spend on discharging this patient, including the last patient exam, discussing the hospital stay, instructions  for ongoing care as it relates to all pertinent caregivers, as well as preparing the medical discharge records, prescriptions, and/or referrals as applicable, is 50 minutes.    Enzo Bi, MD  [facility] 11/08/2022, 8:20 AM

## 2022-11-06 NOTE — Evaluation (Signed)
Physical Therapy Evaluation Patient Details Name: Ethan Wells MRN: [id number] DOB: 08/19/1971 Today's Date: 11/06/2022  History of Present Illness  Ethan Wells is a 51 y.o. male with medical history significant of polysubstance abuse, coronary artery disease, anemia due to hemmoroids, QT prolongation chronic pain syndrome, who presents to the ER today on account of generalized body aches and pains.  At the ER he was found to have potassium of 2.2 subsequently started on IV potassium.  Patient is a poor historian. During my encounter patient was complaining of pain in his arm due to the IV potassium. He endorses recent use of cocaine and alcohol.  Clinical Impression  Patient received sitting up in bed on phone with insurance. He is agreeable to PT assessment after phone call. He is able to stand with supervision. Ambulated in room 50 feet with RW and supervision. No lob, no significant difficulty noted. Patient reports he gets sob with mobility and hence why he wants rollator. Patient's O2 sats at 96% after mobility. He will continue to benefit from skilled PT while here to improve strength and activity tolerance for safe return home.       Recommendations for follow up therapy are one component of a multi-disciplinary discharge planning process, led by the attending physician.  Recommendations may be updated based on patient status, additional functional criteria and insurance authorization.  Follow Up Recommendations No PT follow up Can patient physically be transported by private vehicle: Yes    Assistance Recommended at Discharge PRN  Patient can return home with the following  Assist for transportation    Equipment Recommendations Rollator (4 wheels)  Recommendations for Other Services       Functional Status Assessment Patient has had a recent decline in their functional status and demonstrates the ability to make significant improvements in function in a reasonable  and predictable amount of time.     Precautions / Restrictions Precautions Precautions: Fall Restrictions Weight Bearing Restrictions: No      Mobility  Bed Mobility               General bed mobility comments: patient received sitting up on side of bed    Transfers Overall transfer level: Independent Equipment used: None Transfers: Sit to/from Stand Sit to Stand: Independent                Ambulation/Gait Ambulation/Gait assistance: Supervision Gait Distance (Feet): 50 Feet Assistive device: Rolling walker (2 wheels) Gait Pattern/deviations: Step-through pattern, Decreased stride length, Trunk flexed Gait velocity: Decreased     General Gait Details: slow, steady gait with RW. O2 sats 96% after walking. Reports he gets sob walking and that is why he needs rollator  Stairs            Wheelchair Mobility    Modified Rankin (Stroke Patients Only)       Balance Overall balance assessment: Modified Independent Sitting-balance support: Feet unsupported, No upper extremity supported Sitting balance-Leahy Scale: Normal     Standing balance support: Bilateral upper extremity supported, During functional activity Standing balance-Leahy Scale: Good Standing balance comment: Ambulates short distances without AD, steady with B UE support                             Pertinent Vitals/Pain Pain Assessment Faces Pain Scale: Hurts a little bit Pain Location: generalized, "all over" Pain Intervention(s): Monitored during session    Home Living Family/patient expects to be discharged  to:: Other (Comment)                   Additional Comments: lives in camper on friend's property with approx 3 steps to enter    Prior Function Prior Level of Function : Independent/Modified Independent             Mobility Comments: Was using cane for mobility, MD gave pt a prescription for Rollator but pt hasnt gotten it yet ADLs Comments: Friend  assists with transportation, reports MOD I for ADLs. on disability     Hand Dominance   Dominant Hand: Right    Extremity/Trunk Assessment   Upper Extremity Assessment Upper Extremity Assessment: Overall WFL for tasks assessed    Lower Extremity Assessment Lower Extremity Assessment: Overall WFL for tasks assessed    Cervical / Trunk Assessment Cervical / Trunk Assessment: Normal  Communication   Communication: No difficulties  Cognition Arousal/Alertness: Awake/alert Behavior During Therapy: WFL for tasks assessed/performed Overall Cognitive Status: No family/caregiver present to determine baseline cognitive functioning                                          General Comments      Exercises     Assessment/Plan    PT Assessment Patient needs continued PT services  PT Problem List Decreased strength;Decreased activity tolerance;Decreased mobility;Pain       PT Treatment Interventions DME instruction;Gait training;Stair training;Functional mobility training;Therapeutic activities;Therapeutic exercise;Balance training;Neuromuscular re-education;Cognitive remediation;Patient/family education    PT Goals (Current goals can be found in the Care Plan section)  Acute Rehab PT Goals Patient Stated Goal: return home to camper PT Goal Formulation: With patient Time For Goal Achievement: 11/13/22 Potential to Achieve Goals: Good    Frequency Min 2X/week     Co-evaluation               AM-PAC PT "6 Clicks" Mobility  Outcome Measure Help needed turning from your back to your side while in a flat bed without using bedrails?: None Help needed moving from lying on your back to sitting on the side of a flat bed without using bedrails?: None Help needed moving to and from a bed to a chair (including a wheelchair)?: None Help needed standing up from a chair using your arms (e.g., wheelchair or bedside chair)?: None Help needed to walk in hospital  room?: A Little Help needed climbing 3-5 steps with a railing? : A Little 6 Click Score: 22    End of Session   Activity Tolerance: Patient tolerated treatment well Patient left: in bed;with call bell/phone within reach Nurse Communication: Mobility status PT Visit Diagnosis: Muscle weakness (generalized) (M62.81)    Time: 1206-1219 PT Time Calculation (min) (ACUTE ONLY): 13 min   Charges:   PT Evaluation $PT Eval Low Complexity: 1 Low          Madeline Bebout, PT, GCS 11/06/22,12:54 PM

## 2022-11-06 NOTE — Progress Notes (Addendum)
Rounding Note    Patient Name: Ethan Wells Date of Encounter: 11/06/2022  [place] Cardiologist: Lauree Chandler, MD   Subjective   Doing okay, denies chest pain, has some leg discomfort.  Inpatient Medications    Scheduled Meds:  acetaminophen  1,000 mg Oral Q6H   aspirin EC  81 mg Oral Daily   calcium-vitamin D  1 tablet Oral BID   carvedilol  3.125 mg Oral BID WC   celecoxib  200 mg Oral BID   Chlorhexidine Gluconate Cloth  6 each Topical Daily   ferrous sulfate  325 mg Oral BID WC   folic acid  1 mg Oral Daily   furosemide  80 mg Intravenous BID   gabapentin  300 mg Oral TID   hydrocortisone   Rectal BID   isosorbide mononitrate  15 mg Oral Daily   magnesium oxide  400 mg Oral BID   multivitamin with minerals  1 tablet Oral Daily   nicotine  14 mg Transdermal Daily   pantoprazole  40 mg Oral BID   polyethylene glycol  17 g Oral Daily   predniSONE  20 mg Oral Q breakfast   psyllium  1 packet Oral Daily   rosuvastatin  40 mg Oral Daily   sodium chloride flush  3 mL Intravenous Q12H   thiamine  100 mg Oral Daily   Continuous Infusions:  sodium chloride     PRN Meds: sodium chloride, HYDROmorphone (DILAUDID) injection, ipratropium, lidocaine, methocarbamol, nitroGLYCERIN, oxyCODONE, sodium chloride flush   Vital Signs    Vitals:   11/06/22 0417 11/06/22 0901 11/06/22 0919 11/06/22 1000  BP: (!) 101/54 (!) 88/51 (!) 102/56 103/70  Pulse:  80    Resp:  18    Temp:  98 F (36.7 C)    TempSrc:  Oral    SpO2:  99%    Weight:      Height:        Intake/Output Summary (Last 24 hours) at 11/06/2022 1049 Last data filed at 11/06/2022 0900 Gross per 24 hour  Intake 1676 ml  Output 2070 ml  Net -394 ml      11/04/2022    3:45 AM 11/01/2022    7:19 AM 10/22/2022    2:52 AM  Last 3 Weights  Weight (lbs) 199 lb 4.7 oz 197 lb 8.5 oz 186 lb 4.6 oz  Weight (kg) 90.4 kg 89.6 kg 84.5 kg      Telemetry    Currently off telemetry-  Personally Reviewed  ECG     - Personally Reviewed  Physical Exam   GEN: No acute distress.   Neck: No JVD Cardiac: RRR,  Respiratory: Clear to auscultation bilaterally. GI: Soft, nontender, non-distended  MS: No edema; No deformity. Neuro:  Nonfocal  Psych: Normal affect   Labs    High Sensitivity Troponin:   Recent Labs  Lab 10/21/22 2131 10/29/22 1051 10/29/22 1204 10/29/22 1433 10/29/22 1636  TROPONINIHS 13 15 241* 1,607* 1,418*     Chemistry Recent Labs  Lab 11/03/22 0353 11/04/22 0432 11/05/22 0423  NA 137 140 141  K 4.3 4.8 4.1  CL 105 107 107  CO2 '26 25 26  '$ GLUCOSE 116* 114* 145*  BUN '15 13 16  '$ CREATININE 0.64 0.77 0.77  CALCIUM 9.1 9.1 8.8*  GFRNONAA >60 >60 >60  ANIONGAP '6 8 8    '$ Lipids No results for input(s): "CHOL", "TRIG", "HDL", "LABVLDL", "LDLCALC", "CHOLHDL" in the last 168 hours.  Hematology Recent Labs  Lab 11/04/22 0432 11/05/22 0423 11/06/22 0320  WBC 10.5 8.2 9.0  RBC 2.93* 2.77* 2.82*  HGB 8.9* 8.7* 8.7*  HCT 28.3* 26.8* 26.7*  MCV 96.6 96.8 94.7  MCH 30.4 31.4 30.9  MCHC 31.4 32.5 32.6  RDW 22.2* 22.0* 21.7*  PLT 110* 104* 110*   Thyroid No results for input(s): "TSH", "FREET4" in the last 168 hours.  BNPNo results for input(s): "BNP", "PROBNP" in the last 168 hours.  DDimer No results for input(s): "DDIMER" in the last 168 hours.   Radiology    No results found.  Cardiac Studies   TTE 10/30/2022 1. Left ventricular ejection fraction, by estimation, is 55 to 60%. The  left ventricle has normal function. The left ventricle has no regional  wall motion abnormalities.   2. Right ventricular systolic function is normal. The right ventricular  size is normal.   3. The mitral valve is normal in structure. No evidence of mitral valve  regurgitation.   4. The aortic valve is normal in structure. Aortic valve regurgitation is  not visualized.   5. The inferior vena cava is normal in size with greater than 50%   respiratory variability, suggesting right atrial pressure of 3 mmHg.   LHC 11/04/2022 1.  Severe two-vessel coronary artery disease with chronically occluded mid left circumflex and mid right coronary artery with collaterals.  Distal vessels were patent with mild disease on previous cardiac catheterization in 2012.  LAD stent is patent with minimal restenosis.  In addition, there is progression of proximal to mid ramus stenosis. 2.  Mildly reduced LV systolic function with an EF of 45%. 3.  Moderately elevated left ventricular end-diastolic pressure at 26 mmHg.   Recommendations: The patient has 2 chronic occlusions with collaterals.  Recommend medical therapy for now.  I added small dose Imdur.  Given ongoing rectal bleeding, risks of PCI outweigh the benefit at this time.  In the near future, once his bleeding issues are controlled and if he continues to have anginal symptoms, PCI of the ramus and possibly the right coronary artery can be considered.  The RCA has a microchannel of patent flow. Continue IV furosemide today given elevated LVEDP and switch to oral likely tomorrow.    Patient Profile     51 y.o. male with history of CAD/PCI to LAD 2012, cocaine use, presenting with chest pain, shortness of breath, rectal bleeding secondary to hemorrhoid s/p hemorrhoidectomy, recent SMA stenting.  Being seen for NSTEMI.  Assessment & Plan    NSTEMI -Left heart cath with CTO of RCA, left circumflex, patent LAD stent -Medical management recommended -Continue aspirin 81 mg daily, Crestor 40 mg daily. -Coreg, Imdur for antianginal benefit -EF 55 to 60% -No additional cardiac testing or intervention planned.  2. HFpEF Elevated LVEDP, preserved EF -Switch from IV Lasix to 40 mg twice daily oral  3.  Rectal bleeding s/p hemorrhoidectomy, recent SMA stenting -H&H stable, monitor  4.  Smoking, cocaine use -Cessation recommended  No additional cardiac testing or intervention planned.   Continue current medications as prescribed from a cardiac perspective.  Close follow-up with primary cardiologist upon discharge.  Cardiology will sign off, please let us know if additional input is needed.  Total encounter time more than 50 minutes  Greater than 50% was spent in counseling and coordination of care with the patient     Signed, Kate Sable, MD  11/06/2022, 10:49 AM

## 2022-11-06 NOTE — Progress Notes (Signed)
11/06/2022  Subjective: Patient is s/p EUA and hemorrhoidectomy on 10/28/22 followed by takeback to OR on 10/31/22 due to bleeding from surgical site.  He had a left cardiac cath on 11/04/22.  Patient reports that the pain is doing better and the bleeding with bowel movements is less than before.  Hgb has been stable at 8.7.  Vital signs: Temp:  [97.6 F (36.4 C)-98.1 F (36.7 C)] 98 F (36.7 C) (11/22 0901) Pulse Rate:  [74-85] 80 (11/22 0901) Resp:  [16-20] 18 (11/22 0901) BP: (82-113)/(44-65) 102/56 (11/22 0919) SpO2:  [96 %-100 %] 99 % (11/22 0901)   Intake/Output: 11/21 0701 - 11/22 0700 In: 5883 [P.O.:1436] Out: 2570 [Urine:2570] Last BM Date : 11/05/22  Physical Exam: Constitutional:  No acute distress Rectal:  External exam shows healing wounds from the external hemorrhoidectomy component.  No active bleeding, and no gross blood noted.  DRE deferred.  Labs:  Recent Labs    11/05/22 0423 11/06/22 0320  WBC 8.2 9.0  HGB 8.7* 8.7*  HCT 26.8* 26.7*  PLT 104* 110*   Recent Labs    11/04/22 0432 11/05/22 0423  NA 140 141  K 4.8 4.1  CL 107 107  CO2 25 26  GLUCOSE 114* 145*  BUN 13 16  CREATININE 0.77 0.77  CALCIUM 9.1 8.8*   No results for input(s): "LABPROT", "INR" in the last 72 hours.  Imaging: No results found.  Assessment/Plan: This is a 51 y.o. male s/p EUA and hemorrhoidectomy with takeback due to bleeding pedicles.  --No gross bleeding noted at this point and the external tissue is healing appropriately.  No evidence of infection or other complication.  Pain is improving and Hgb is stable. --Discussed with patient that some blood is not uncommon with bowel movements as the tissue continues to heal, and also he is on Aspirin.  This will continue to improve on its own.  The pain will also continue to improve as the tissues heal more and the nerves are less raw/irritated.  Can continue with po pain meds, lidocaine ointment, and sitz baths twice daily.   Also important to continue with bowel regimen to prevent constipation or hard stool. --Surgery team will sign off, but please feel free to contact us if any issues or concerns. --Patient can follow up about 2 weeks after discharge.   Melvyn Neth, [facility]

## 2022-11-06 NOTE — Consult Note (Signed)
   [facility] Inpatient Consult   11/06/2022  Ethan Wells 1971-02-06 [id number]  [place] [facility] [ACO] Patient:  [name] Liaison remote coverage review for [facility]   Primary Care Provider: Nickola Major, NP noted with [facility] which follows the transition of care follow up     Patient screened for long length of stay hospitalization with noted extreme high risk score for unplanned readmission risk [noted 15 day length of stay] with less than 30 days readmission.   Spoke with patient via phone listed to assess for potential [place] Management service needs for post hospital transition for care coordination, HIPAA verified..  Review of patient's electronic medical record reveals patient is for discharge today. However, patient states he is not sure as everything is up in the air at this oint when asking about which hotel he is planning to go to. Patient states he is using [employer] program prior to admission. Patient in agreement for follow up at the phone number used today. Reach out to inpatient [facility] LCSW regarding patient and notes patient maybe appealing discharge.  Plan:  Continue to follow progress and disposition to assess for post hospital community care coordination/management needs.  Referral request for community care coordination: if patient transitions this writer will make a referral closer when transition is known  Of note, [place] does not replace or interfere with any arrangements made by the Inpatient Transition of Care team.  For questions contact:   Natividad Brood, RN BSN [place]  [phone] business mobile phone Toll free office [phone]  *[place]  [phone] Fax number:  [phone] [email or web address]

## 2022-11-06 NOTE — TOC Progression Note (Signed)
Transition of Care [facility]) - Progression Note    Patient Details  Name: Ethan Wells MRN: [id number] Date of Birth: [birth year]  Transition of Care [facility]) CM/SW [place] Phone Number: [phone] 11/06/2022, 12:25 PM   Judithann Graves Appeal Detailed Notice of Discharge letter created and saved: Yes (Completed by Kelby Fam, LCSW) Detailed Notice of Discharge Document Given to [place]: Yes (Completed by Kelby Fam, LCSW) [facility] ROI Document Created: Yes [facility] appeal documents uploaded to [facility] stite: Yes (Upload Confirmation:[id number])    [facility] Case ID: [id number]

## 2022-11-07 LAB — CBC
HCT: 30 % — ABNORMAL LOW (ref 39.0–52.0)
Hemoglobin: 9.5 g/dL — ABNORMAL LOW (ref 13.0–17.0)
MCH: 30.3 pg (ref 26.0–34.0)
MCHC: 31.7 g/dL (ref 30.0–36.0)
MCV: 95.5 fL (ref 80.0–100.0)
Platelets: 124 10*3/uL — ABNORMAL LOW (ref 150–400)
RBC: 3.14 MIL/uL — ABNORMAL LOW (ref 4.22–5.81)
RDW: 22.1 % — ABNORMAL HIGH (ref 11.5–15.5)
WBC: 7 10*3/uL (ref 4.0–10.5)
nRBC: 0 % (ref 0.0–0.2)

## 2022-11-07 MED ORDER — POLYETHYLENE GLYCOL 3350 17 G PO PACK
17.0000 g | PACK | Freq: Two times a day (BID) | ORAL | Status: DC
  Administered 2022-11-07 – 2022-11-08 (×2): 17 g via ORAL
  Filled 2022-11-07 (×2): qty 1

## 2022-11-07 MED ORDER — METOPROLOL TARTRATE 25 MG PO TABS
12.5000 mg | ORAL_TABLET | Freq: Two times a day (BID) | ORAL | Status: DC
  Administered 2022-11-07: 12.5 mg via ORAL
  Filled 2022-11-07 (×2): qty 1

## 2022-11-07 NOTE — TOC Progression Note (Signed)
Transition of Care [facility]) - Progression Note    Patient Details  Name: Ethan Wells MRN: [id number] Date of Birth: 03-09-1971  Transition of Care [facility]) CM/SW Contact  Shelbie Hutching, RN Phone Number: 11/07/2022, 8:30 AM  Clinical Narrative:    Judithann Graves appeal currently under clinical review.  TOC will continue to check on status of appeal.    Expected Discharge Plan:  [facility]) Barriers to Discharge: Continued Medical Work up  Expected Discharge Plan and Services Expected Discharge Plan:  [facility])     Post Acute Care Choice: NA Living arrangements for the past 2 months: Mobile Home, Hotel/Motel Expected Discharge Date: 11/05/22                                     Social Determinants of Health (SDOH) Interventions Food Insecurity Interventions: Other (Comment), Inpatient TOC (Gave information for food bank.) Housing Interventions: Inpatient TOC, Other (Comment) (Gave resources for shelter and housing authorities.)  Readmission Risk Interventions    10/22/2022   10:47 AM  Readmission Risk Prevention Plan  Transportation Screening Complete  PCP or Specialist Appt within 3-5 Days Complete  Social Work Consult for [place] Planning/Counseling Complete  Palliative Care Screening Not Applicable  Medication Review Press photographer) Complete

## 2022-11-07 NOTE — TOC Progression Note (Signed)
Transition of Care [facility]) - Progression Note    Patient Details  Name: CAPRICE WASKO MRN: [id number] Date of Birth: Feb 10, 1971  Transition of Care [facility]) CM/SW Contact  Shelbie Hutching, RN Phone Number: 11/07/2022, 1:04 PM  Clinical Narrative:    Still under clinical review with [facility].    Expected Discharge Plan:  [facility]) Barriers to Discharge: Continued Medical Work up  Expected Discharge Plan and Services Expected Discharge Plan:  [facility])     Post Acute Care Choice: NA Living arrangements for the past 2 months: Mobile Home, Hotel/Motel Expected Discharge Date: 11/05/22                                     Social Determinants of Health (SDOH) Interventions Food Insecurity Interventions: Other (Comment), Inpatient TOC (Gave information for food bank.) Housing Interventions: Inpatient TOC, Other (Comment) (Gave resources for shelter and housing authorities.)  Readmission Risk Interventions    10/22/2022   10:47 AM  Readmission Risk Prevention Plan  Transportation Screening Complete  PCP or Specialist Appt within 3-5 Days Complete  Social Work Consult for [place] Planning/Counseling Complete  Palliative Care Screening Not Applicable  Medication Review Press photographer) Complete

## 2022-11-08 MED ORDER — IBUPROFEN 400 MG PO TABS
400.0000 mg | ORAL_TABLET | Freq: Four times a day (QID) | ORAL | Status: DC | PRN
  Administered 2022-11-08: 400 mg via ORAL
  Filled 2022-11-08: qty 1

## 2022-11-08 MED ORDER — ACETAMINOPHEN 500 MG PO TABS: 1000.0000 mg | ORAL_TABLET | Freq: Three times a day (TID) | ORAL | Status: DC | PRN

## 2022-11-08 MED ORDER — METOPROLOL TARTRATE 25 MG PO TABS: 12.5000 mg | ORAL_TABLET | Freq: Two times a day (BID) | ORAL | 2 refills | Status: DC

## 2022-11-08 NOTE — Plan of Care (Signed)
Patient states he is "hurting" and wants to understand why - but also would appreciate some stronger pain meds.  Stated "If Dr. is not willing/able to do either - write DC orders and let me leave."  Dr. Informed.

## 2022-11-08 NOTE — Progress Notes (Signed)
Pt was seen today.  Pt was discharged on 11/07/23, but he refused to leave and filed an appeal.  Both cardiology and GenSurg have signed off.  PT worked with pt and cleared pt for going home.  Today, RN reported pt had stool with small amount of blood around it, which I saw, and is consistent with hemorrhoidal bleeding, which GenSurg said was to be expected.    Pt was not fully compliant with scheduled Miralax, so advised pt he needs to take Miralax to keep his stool soft to reduce bleeding.  Appeal still under review with [employer].

## 2022-11-08 NOTE — TOC Progression Note (Signed)
Transition of Care [facility]) - Progression Note    Patient Details  Name: Ethan Wells MRN: [id number] Date of Birth: 23-Dec-1970  Transition of Care [facility]) CM/SW [place], LCSW Phone Number: 11/08/2022, 3:35 PM  Clinical Narrative:    Per Judithann Graves, patient lost his appeal and has until noon tomorrow to discharge before financial liability. RN and MD updated.    Expected Discharge Plan:  [facility]) Barriers to Discharge: Continued Medical Work up  Expected Discharge Plan and Services Expected Discharge Plan:  [facility])     Post Acute Care Choice: NA Living arrangements for the past 2 months: Mobile Home, Hotel/Motel Expected Discharge Date: 11/05/22                                     Social Determinants of Health (SDOH) Interventions Food Insecurity Interventions: Other (Comment), Inpatient TOC (Gave information for food bank.) Housing Interventions: Inpatient TOC, Other (Comment) (Gave resources for shelter and housing authorities.)  Readmission Risk Interventions    10/22/2022   10:47 AM  Readmission Risk Prevention Plan  Transportation Screening Complete  PCP or Specialist Appt within 3-5 Days Complete  Social Work Consult for [place] Planning/Counseling Complete  Palliative Care Screening Not Applicable  Medication Review Press photographer) Complete

## 2022-11-11 ENCOUNTER — Telehealth: Payer: Self-pay

## 2022-11-11 ENCOUNTER — Inpatient Hospital Stay: Payer: Medicare HMO | Admitting: Adult Health

## 2022-11-11 NOTE — Patient Outreach (Signed)
  Care Coordination TOC Note Transition Care Management Unsuccessful Follow-up Telephone Call  Date of discharge and from where:  11/08/22-[facility]  Attempts:  1st Attempt  Reason for unsuccessful TCM follow-up call:  Left voice message    Hetty Blend [place] Management Telephonic Care Management Coordinator Direct Phone: [phone] Toll Free: [phone] Fax: [phone]

## 2022-11-12 ENCOUNTER — Telehealth: Payer: Self-pay

## 2022-11-12 ENCOUNTER — Telehealth: Payer: Self-pay | Admitting: *Deleted

## 2022-11-12 NOTE — Progress Notes (Signed)
  Care Coordination  Note  11/12/2022 Name: [name] MRN: [id number] DOB: 1971/07/16  Ethan Wells is a 51 y.o. year old primary care patient of Medina-Vargas, Monina C, NP. I reached out to Ethan Wells by phone today to assist with scheduling a follow up appointment. Ethan Wells verbally consented to my assistance.       Follow up plan: Hospital Follow Up appointment scheduled with Carmela Hurt, NP on 11/15/22 at 2:20 PM.  [place]: [phone]

## 2022-11-12 NOTE — Patient Outreach (Signed)
  Care Coordination TOC Note Transition Care Management Follow-up Telephone Call Date of discharge and from where: 11/08/22-[facility] DX: acute on chronic CHF How have you been since you were released from the hospital? Spoke briefly with patient as he did not want to be bothered and was very short with responses. States he does not think he feels much better since returning home. Reviewed s/s of worsening condition and when to seek medical attention.Encouraged patient to follow up with MD. He has not made PCP appt. Patient then ended call.        Follow up appointments reviewed:  PCP Hospital f/u appt confirmed? No  .   SDOH assessments and interventions completed:   No  Care Coordination Interventions Activated:  Yes   Care Coordination Interventions:  PCP follow up appointment requested -message sent to care guide to assist with scheduling.   Encounter Outcome:  Pt. Refused    Enzo Montgomery, RN,BSN,CCM [facility] Telephonic Care Management Coordinator Direct Phone: [phone] Toll Free: [phone] Fax: [phone]

## 2022-11-12 NOTE — Patient Outreach (Signed)
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  Of note, [facility] Care Management services does not replace or interfere with any services that are arranged by inpatient case management or social work.  For additional questions or referrals please contact:    Plan: Will sign off.   Natividad Brood, RN BSN [place]  [phone] business mobile phone Toll free office [phone]  *[place]  [phone] Fax number: [phone] [email or web address]

## 2022-11-15 ENCOUNTER — Encounter: Payer: Self-pay | Admitting: Adult Health

## 2022-11-15 ENCOUNTER — Ambulatory Visit (INDEPENDENT_AMBULATORY_CARE_PROVIDER_SITE_OTHER): Payer: Medicare HMO | Admitting: Adult Health

## 2022-11-15 VITALS — Ht 65.0 in | Wt 200.0 lb

## 2022-11-15 DIAGNOSIS — K648 Other hemorrhoids: Secondary | ICD-10-CM | POA: Diagnosis not present

## 2022-11-15 DIAGNOSIS — I5033 Acute on chronic diastolic (congestive) heart failure: Secondary | ICD-10-CM

## 2022-11-15 DIAGNOSIS — M62838 Other muscle spasm: Secondary | ICD-10-CM | POA: Diagnosis not present

## 2022-11-15 DIAGNOSIS — I214 Non-ST elevation (NSTEMI) myocardial infarction: Secondary | ICD-10-CM

## 2022-11-15 DIAGNOSIS — R52 Pain, unspecified: Secondary | ICD-10-CM

## 2022-11-15 MED ORDER — METHOCARBAMOL 750 MG PO TABS: 750.0000 mg | ORAL_TABLET | Freq: Every day | ORAL | 0 refills | Status: DC | PRN

## 2022-11-15 NOTE — Progress Notes (Unsigned)
DATE:  11/15/2022 MRN:  [id number]  BIRTHDAY: 09/13/1971   Contact Information     Name Relation Home Work Mobile   Ethan Wells,Ethan Wells Relative [phone]          Code Status History     Date Active Date Inactive Code Status Order ID Comments User Context   10/21/2022 2344 11/08/2022 2257 Full Code [id number]  DibiaManfred Shirts, MD ED   10/01/2022 0007 10/03/2022 2257 Full Code [id number]  Toy Baker, MD ED   08/19/2022 1520 08/20/2022 0402 Full Code [id number]  Blake Divine, MD ED   05/28/2022 1643 06/01/2022 2047 Full Code [id number]  Collier Bullock, MD ED   08/07/2021 2157 08/11/2021 1902 Full Code [id number]  Athena Masse, MD ED   12/19/2020 2354 12/23/2020 1816 Full Code [id number]  Athena Masse, MD ED   06/04/2020 1914 06/06/2020 1702 Full Code [id number]  Ivor Costa, MD ED   07/12/2019 1246 07/15/2019 1711 Full Code [id number]  Nicholes Mango, MD ED   05/25/2018 0819 05/27/2018 1553 Full Code [id number]  Arta Silence, MD Inpatient        Chief Complaint  Patient presents with   Acute Visit    Patient is being seen for diarrhea and pain medication refills    HISTORY OF PRESENT ILLNESS: This is a 51-year -old male who had a telephone visit today. He was recently hospitalized on 10/21/22 to 11/06/22 for acute on chronic diastolic CHF.  He was given IV Lasix for diuresis and was transitioned to oral Lasix 40 mg twice a day prior to discharge.  He was also started on low-dose Lopressor 12.5 mg twice a day.  He complained of rectal bleeding and hemorrhoidal procedure was done on 11/14/2022.  On 11/16, he had significant rectal bleeding so he was taken back to the OR where 2 bleeding pedicles were ligated.  On 11/40/23, he had chest pain and developed shortness of breath.  He was started on BiPAP and diuretics.  Troponin peaked at 1607.  Heart catheterization was done on 11/20 when hemoglobin was stable.  He was found to have new stenosis at the left circumflex and mid right coronary  artery disease with collaterals. Cardiology recommended to have medical management.     PAST MEDICAL HISTORY:  Past Medical History:  Diagnosis Date   CAD (coronary artery disease)    a. 01/2011 Anterior STEMI/Cath/PCI: LM nl, LAD 100d (3.5x40m Vision BMS placed), LCX 23mRI 50, RCA min irregs, EF 40% w/ apical, inferoapical HK.   Cardiac arrest - ventricular fibrillation    a. In setting of STEMI 01/2011   Chronic pain    Cocaine abuse ([place]   Depression with anxiety    ETOH abuse    a. 6-12 beers/day   GERD (gastroesophageal reflux disease)    Hemorrhoids    Hyperlipidemia    Hypertension    Ischemic cardiomyopathy    a. 06/2011 Echo: EF 45-50%, No rwma   Marijuana abuse    Migraines    Tobacco abuse    a. 1/2 ppd x 26 yrs     CURRENT MEDICATIONS: Reviewed  Patient's Medications  New Prescriptions   No medications on file  Previous Medications   ACETAMINOPHEN (TYLENOL) 500 MG TABLET    Take 2 tablets (1,000 mg total) by mouth every 6 (six) hours as needed for mild pain.   ASPIRIN 81 MG CHEWABLE TABLET    Chew 1 tablet (81 mg total) by mouth daily.   CALCIUM-VITAMIN D (OSCAL  WITH D) 500-5 MG-MCG TABLET    Take 1 tablet by mouth 2 (two) times daily.   EZETIMIBE (ZETIA) 10 MG TABLET    Take 10 mg by mouth daily.   FERROUS SULFATE 325 (65 FE) MG TABLET    Take 1 tablet (325 mg total) by mouth daily with breakfast.   FOLIC ACID (FOLVITE) 1 MG TABLET    Take 1 tablet (1 mg total) by mouth daily.   FUROSEMIDE (LASIX) 40 MG TABLET    Take 1 tablet (40 mg total) by mouth 2 (two) times daily.   GABAPENTIN (NEURONTIN) 300 MG CAPSULE    Take 1 capsule (300 mg total) by mouth 3 (three) times daily.   HYDROCORTISONE (ANUSOL-HC) 2.5 % RECTAL CREAM    Place rectally 2 (two) times daily for 10 days.   IBUPROFEN (ADVIL) 800 MG TABLET    Take 1 tablet (800 mg total) by mouth every 8 (eight) hours as needed for moderate pain.   LIDOCAINE (XYLOCAINE) 5 % OINTMENT    Apply 1 Application  topically 4 (four) times daily as needed. Apply to the perianal area   MAGNESIUM OXIDE (MAG-OX) 400 (240 MG) MG TABLET    Take 1 tablet (400 mg total) by mouth 2 (two) times daily.   METHOCARBAMOL (ROBAXIN-750) 750 MG TABLET    Take 1 tablet (750 mg total) by mouth daily as needed for muscle spasms.   METOPROLOL TARTRATE (LOPRESSOR) 25 MG TABLET    Take 0.5 tablets (12.5 mg total) by mouth 2 (two) times daily.   MULTIPLE VITAMINS-MINERALS (MULTIVITAMIN) TABLET    Take 1 tablet by mouth daily.   NICOTINE (NICODERM CQ - DOSED IN MG/24 HOURS) 14 MG/24HR PATCH    Place 1 patch (14 mg total) onto the skin daily.   NITROGLYCERIN (NITROSTAT) 0.4 MG SL TABLET    Place 1 tablet (0.4 mg total) under the tongue every 5 (five) minutes as needed for chest pain.   PANTOPRAZOLE (PROTONIX) 40 MG TABLET    Take 1 tablet (40 mg total) by mouth daily.   POLYETHYLENE GLYCOL (MIRALAX / GLYCOLAX) 17 G PACKET    Take 17 g by mouth daily.   PSYLLIUM (HYDROCIL/METAMUCIL) 95 % PACK    Take 1 packet by mouth daily.   ROSUVASTATIN (CRESTOR) 40 MG TABLET    Take 1 tablet (40 mg total) by mouth daily.   THIAMINE (VITAMIN B-1) 100 MG TABLET    Take 1 tablet (100 mg total) by mouth daily.  Modified Medications   No medications on file  Discontinued Medications   No medications on file     Allergies  Allergen Reactions   Penicillins Hives    Tolerated Ceftriaxone 10/2022   Other Rash    chives     REVIEW OF SYSTEMS:  GENERAL: no change in appetite, no fatigue, no weight changes, no fever, chills or weakness SKIN: Denies rash, itching, wounds, ulcer sores, or nail abnormality EYES: Denies change in vision, dry eyes, eye pain, itching or discharge EARS: Denies change in hearing, ringing in ears, or earache NOSE: Denies nasal congestion or epistaxis MOUTH and THROAT: Denies oral discomfort, gingival pain or bleeding, pain from teeth or hoarseness   RESPIRATORY: no cough, SOB, DOE, wheezing, hemoptysis CARDIAC: no  chest pain, edema or palpitations GI: no abdominal pain, diarrhea, constipation, heart burn, nausea or vomiting GU: Denies dysuria, frequency, hematuria, incontinence, or discharge MUSCULOSKELETAL: Denies joit pain, muscle pain, back pain, restricted movement, or unusual weakness CIRCULATION: Denies claudication,  edema of legs, varicosities, or cold extremities NEUROLOGICAL: Denies dizziness, syncope, numbness, or headache PSYCHIATRIC: Denies feeling of depression or anxiety. No report of hallucinations, insomnia, paranoia, or agitation ENDOCRINE: Denies polyphagia, polyuria, polydipsia, heat or cold intolerance HEME/LYMPH: Denies excessive bruising, petechia, enlarged lymph nodes, or bleeding problems IMMUNOLOGIC: Denies history of frequent infections, AIDS, or use of immunosuppressive agents     LABS/RADIOLOGY: Labs reviewed: Basic Metabolic Panel: Recent Labs    09/30/22 2225 10/01/22 0137 10/02/22 0440 10/03/22 0150 10/21/22 2131 10/22/22 0421 10/23/22 0449 10/25/22 0339 10/26/22 0620 11/03/22 0353 11/04/22 0432 11/05/22 0423  NA 133* 129* 135   < >  --    < > 139 138   < > 137 140 141  K 3.6 3.5 3.7   < >  --    < > 3.1* 5.4*   < > 4.3 4.8 4.1  CL 105 101 107   < >  --    < > 105 112*   < > 105 107 107  CO2 22 20* 22   < >  --    < > 28 23   < > '26 25 26  '$ GLUCOSE 91 116* 124*   < >  --    < > 118* 138*   < > 116* 114* 145*  BUN <5* <5* <5*   < >  --    < > <5* 9   < > '15 13 16  '$ CREATININE 0.61 0.72 0.68   < >  --    < > 0.70 0.68   < > 0.64 0.77 0.77  CALCIUM 7.8* 7.5* 8.0*   < >  --    < > 8.6* 8.5*   < > 9.1 9.1 8.8*  MG 1.6* 1.6* 2.2   < > 1.7  --  1.9 2.2  --   --   --   --   PHOS 2.1* 1.8* 2.9  --   --   --   --   --   --   --   --   --    < > = values in this interval not displayed.   Liver Function Tests: Recent Labs    10/03/22 0150 10/22/22 0421 10/23/22 0449  AST 62* 73* 63*  ALT '23 17 17  '$ ALKPHOS 230* 249* 233*  BILITOT 1.5* 1.8* 1.9*  PROT 6.6 6.8  6.5  ALBUMIN 2.4* 2.7* 2.5*   Recent Labs    05/28/22 1819 08/19/22 1253 09/30/22 1155  LIPASE 34 29 40   Recent Labs    09/30/22 2225  AMMONIA 52*   CBC: Recent Labs    09/23/22 1034 09/30/22 1155 09/30/22 2225 10/01/22 0137 10/17/22 1546 10/21/22 2000 11/05/22 0423 11/06/22 0320 11/07/22 0418  WBC 8.9   < > 6.8   < > 14.0*   < > 8.2 9.0 7.0  NEUTROABS 6,453  --  4.8  --  10,486*  --   --   --   --   HGB 10.3*   < > 8.2*   < > 12.3*   < > 8.7* 8.7* 9.5*  HCT 29.5*   < > 24.7*   < > 36.4*   < > 26.8* 26.7* 30.0*  MCV 89.7   < > 93.6   < > 91.0   < > 96.8 94.7 95.5  PLT 225   < > 175   < > 356   < > 104* 110* 124*   < > =  values in this interval not displayed.   A1C: Invalid input(s): "A1C" Lipid Panel: Recent Labs    09/23/22 1034 09/30/22 2225  HDL 13* 20*   Cardiac Enzymes: Recent Labs    09/30/22 2225  CKTOTAL 97   BNP: Invalid input(s): "POCBNP" CBG: Recent Labs    10/03/22 1609 10/29/22 1102 10/29/22 1137  GLUCAP 171* 179* 177*      CARDIAC CATHETERIZATION  Addendum Date: 11/06/2022     Mid Cx lesion is 100% stenosed.   Ramus lesion is 85% stenosed.   Prox LAD to Mid LAD lesion is 5% stenosed.   Prox RCA to Mid RCA lesion is 100% stenosed.   Prox RCA lesion is 80% stenosed.   There is mild left ventricular systolic dysfunction.   LV end diastolic pressure is moderately elevated.   The left ventricular ejection fraction is 45-50% by visual estimate. 1.  Severe two-vessel coronary artery disease with chronically occluded mid left circumflex and mid right coronary artery with collaterals.  These vessels were patent with mild disease on previous cardiac catheterization in 2012.  LAD stent is patent with minimal restenosis.  In addition, there is progression of proximal to mid ramus stenosis. 2.  Mildly reduced LV systolic function with an EF of 45%. 3.  Moderately elevated left ventricular end-diastolic pressure at 26 mmHg. Recommendations: The  patient has 2 chronic occlusions with collaterals.  Recommend medical therapy for now.  I added small dose Imdur.  Given ongoing rectal bleeding, risks of PCI outweigh the benefit at this time.  In the near future, once his bleeding issues are controlled and if he continues to have anginal symptoms, PCI of the ramus and possibly the right coronary artery can be considered.  The RCA has a microchannel of patent flow. Continue IV furosemide today given elevated LVEDP and switch to oral likely tomorrow.  Result Date: 11/06/2022   Mid Cx lesion is 100% stenosed.   Ramus lesion is 85% stenosed.   Prox LAD to Mid LAD lesion is 5% stenosed.   Prox RCA to Mid RCA lesion is 100% stenosed.   Prox RCA lesion is 80% stenosed.   There is mild left ventricular systolic dysfunction.   LV end diastolic pressure is moderately elevated.   The left ventricular ejection fraction is 45-50% by visual estimate. 1.  Severe two-vessel coronary artery disease with chronically occluded mid left circumflex and mid right coronary artery with collaterals.  Distal vessels were patent with mild disease on previous cardiac catheterization in 2012.  LAD stent is patent with minimal restenosis.  In addition, there is progression of proximal to mid ramus stenosis. 2.  Mildly reduced LV systolic function with an EF of 45%. 3.  Moderately elevated left ventricular end-diastolic pressure at 26 mmHg. Recommendations: The patient has 2 chronic occlusions with collaterals.  Recommend medical therapy for now.  I added small dose Imdur.  Given ongoing rectal bleeding, risks of PCI outweigh the benefit at this time.  In the near future, once his bleeding issues are controlled and if he continues to have anginal symptoms, PCI of the ramus and possibly the right coronary artery can be considered.  The RCA has a microchannel of patent flow. Continue IV furosemide today given elevated LVEDP and switch to oral likely tomorrow.   ECHOCARDIOGRAM  LIMITED  Result Date: 10/30/2022    ECHOCARDIOGRAM LIMITED REPORT   Patient Name:   Ethan Wells Date of Exam: 10/30/2022 Medical Rec #:  [id number]  Height:       65.0 in Accession #:    [id number]          Weight:       186.3 lb Date of Birth:  1971-01-23           BSA:          1.919 m Patient Age:    58 years            BP:           112/64 mmHg Patient Gender: M                   HR:           88 bpm. Exam Location:  [facility] Procedure: Limited Echo, Limited Color Doppler, Cardiac Doppler and Intracardiac            Opacification Agent Indications:     I24.9 Acute ischemic heart disease, unspecified  History:         Patient has prior history of Echocardiogram examinations, most                  recent 10/01/2022. Ischemic Cardiomyopathy, CAD; Risk                  Factors:Hypertension, Dyslipidemia and Current Smoker.  Sonographer:     Charmayne Sheer Referring Phys:  [phone] CHRISTOPHER END Diagnosing Phys: Kate Sable MD  Sonographer Comments: Technically difficult study due to poor echo windows. Image acquisition challenging due to patient body habitus. IMPRESSIONS  1. Left ventricular ejection fraction, by estimation, is 55 to 60%. The left ventricle has normal function. The left ventricle has no regional wall motion abnormalities.  2. Right ventricular systolic function is normal. The right ventricular size is normal.  3. The mitral valve is normal in structure. No evidence of mitral valve regurgitation.  4. The aortic valve is normal in structure. Aortic valve regurgitation is not visualized.  5. The inferior vena cava is normal in size with greater than 50% respiratory variability, suggesting right atrial pressure of 3 mmHg. FINDINGS  Left Ventricle: Left ventricular ejection fraction, by estimation, is 55 to 60%. The left ventricle has normal function. The left ventricle has no regional wall motion abnormalities. Definity contrast agent was given IV to delineate the left ventricular   endocardial borders. The left ventricular internal cavity size was normal in size. Right Ventricle: The right ventricular size is normal. Right ventricular systolic function is normal. Mitral Valve: The mitral valve is normal in structure. Tricuspid Valve: The tricuspid valve is normal in structure. Tricuspid valve regurgitation is trivial. Aortic Valve: The aortic valve is normal in structure. Aortic valve regurgitation is not visualized. Aorta: The aortic root and ascending aorta are structurally normal, with no evidence of dilitation. Venous: The inferior vena cava is normal in size with greater than 50% respiratory variability, suggesting right atrial pressure of 3 mmHg. Additional Comments: Spectral Doppler performed. Color Doppler performed.  LEFT VENTRICLE PLAX 2D LVIDd:         5.40 cm LVIDs:         3.40 cm LV PW:         1.30 cm LV IVS:        1.00 cm  LEFT ATRIUM         Index LA diam:    4.90 cm 2.55 cm/m Kate Sable MD Electronically signed by Kate Sable MD Signature Date/Time: 10/30/2022/9:51:06 AM    Final  CT Angio Chest Pulmonary Embolism (PE) W or WO Contrast  Result Date: 10/29/2022 CLINICAL DATA:  Pulmonary embolism (PE) suspected, low to intermediate prob, positive D-dimer EXAM: CT ANGIOGRAPHY CHEST WITH CONTRAST TECHNIQUE: Multidetector CT imaging of the chest was performed using the standard protocol during bolus administration of intravenous contrast. Multiplanar CT image reconstructions and MIPs were obtained to evaluate the vascular anatomy. RADIATION DOSE REDUCTION: This exam was performed according to the departmental dose-optimization program which includes automated exposure control, adjustment of the mA and/or kV according to patient size and/or use of iterative reconstruction technique. CONTRAST:  20m OMNIPAQUE IOHEXOL 350 MG/ML SOLN COMPARISON:  Chest x-ray 10/29/2022, CT angio chest 09/30/2022 FINDINGS: Cardiovascular: Satisfactory opacification of the  pulmonary arteries to the segmental level. No evidence of pulmonary embolism. The main pulmonary artery is enlarged measuring up to 3.6 cm. Normal heart size. No significant pericardial effusion. The thoracic aorta is normal in caliber. Mild atherosclerotic plaque of the thoracic aorta. At least 2 vessel coronary artery calcifications. Mediastinum/Nodes: No enlarged mediastinal, hilar, or axillary lymph nodes. Thyroid gland, trachea, and esophagus demonstrate no significant findings. Lungs/Pleura: Peribronchovascular ground-glass airspace opacities scattered throughout the lungs and slightly more prominent in the upper lobes. No pulmonary nodule. No pulmonary mass. Trace bilateral pleural effusions. No pneumothorax. Upper Abdomen: Splenomegaly. Query pericholecystic fluid and gallbladder wall thickening Musculoskeletal: No chest wall abnormality. No suspicious lytic or blastic osseous lesions. No acute displaced fracture. Review of the MIP images confirms the above findings. IMPRESSION: 1. No pulmonary embolus. 2. Enlarged main pulmonary artery suggestive of pulmonary hypertension. 3. Diffuse peribronchovascular ground-glass airspace opacities suggestive of infection/inflammation. Consider repeat CT chest in 3 months to evaluate for resolution. 4. Trace bilateral pleural effusions. 5. Splenomegaly. 6. Query pericholecystic fluid and gallbladder wall thickening. Correlate with liver function-if clinically indicated consider right upper quadrant ultrasound. Electronically Signed   By: MIven FinnM.D.   On: 10/29/2022 17:15   DG Chest Port 1 View  Result Date: 10/29/2022 CLINICAL DATA:  Shortness of breath EXAM: PORTABLE CHEST 1 VIEW COMPARISON:  Chest x-ray dated October 21, 2022 FINDINGS: Unchanged cardiomegaly. New diffuse interstitial opacities. No large pleural effusion or evidence of pneumothorax. IMPRESSION: New diffuse interstitial opacities, compatible with pulmonary edema. Electronically Signed    By: LYetta GlassmanM.D.   On: 10/29/2022 11:02   [place] Ext Art Right Ltd  Result Date: 10/26/2022 CLINICAL DATA:  Groin pain 3 days post visceral arteriogram EXAM: RIGHT LOWER EXTREMITY ARTERIAL DUPLEX SCAN TECHNIQUE: Gray-scale sonography as well as color Doppler and duplex ultrasound was performed to evaluate the right lower extremity arteries including the common, superficial and profunda femoral arteries, popliteal artery and calf arteries. COMPARISON:  CT 09/30/2022 FINDINGS: Normal waveforms in the common and superficial femoral arteries. Normal color Doppler signal and common, deep, and superficial femoral arteries. No evidence of pseudoaneurysm. Normal venous color signal and waveforms in the right common femoral vein, femoral vein, and GSV. No evidence of AV fistula. No regional hematoma. IMPRESSION: Negative. Electronically Signed   By: DLucrezia EuropeM.D.   On: 10/26/2022 20:00   PERIPHERAL VASCULAR CATHETERIZATION  Result Date: 10/24/2022 See surgical note for result.  DG Chest Port 1 View  Result Date: 10/21/2022 CLINICAL DATA:  Chest pain EXAM: PORTABLE CHEST 1 VIEW COMPARISON:  09/30/2022 FINDINGS: Lungs are clear.  No pleural effusion or pneumothorax. The heart is top-normal in size. IMPRESSION: No evidence of acute cardiopulmonary disease. Electronically Signed   By: SHenderson NewcomerD.  On: 10/21/2022 20:38    ASSESSMENT/PLAN:  1. Acute on chronic heart failure with preserved ejection fraction (HFpEF) ([place]) -  continue Lasix and Metoprolol  2. Muscle spasm -  complaining of muscle spasm - methocarbamol (ROBAXIN-750) 750 MG tablet; Take 1 tablet (750 mg total) by mouth daily as needed for muscle spasms. (Patient not taking: Reported on 11/19/2022)  Dispense: 30 tablet; Refill: 0  3. Generalized pain -  he does not want home health going to his house since [place] and [place]" - AMB Referral to [facility]  4. Hemorrhoids, internal, with  bleeding -  encourage to do sitz baths BID  5. NSTEMI (non-ST elevated myocardial infarction) ([place]) -  denies chest pains -  continue Isosorbide MN and NTG PRN    Time spent on non face to face visit:  17 minutes  The patient gave consent to this telephone visit. Explained to the patient the risk and privacy issue that was involved with this telephone call.   The patient was advised to call back and ask for an in-person evaluation if the symptoms worsen or if the condition fails to improve.   Durenda Age, NP [employer] [phone]

## 2022-11-18 ENCOUNTER — Telehealth: Payer: Self-pay | Admitting: *Deleted

## 2022-11-18 NOTE — Progress Notes (Signed)
  Care Coordination  Outreach Note  11/18/2022 Name: AH BOTT MRN: [id number] DOB: 12/03/1971   Care Coordination Outreach Attempts: An unsuccessful telephone outreach was attempted today to offer the patient information about available care coordination services as a benefit of their health plan.   Follow Up Plan:  Additional outreach attempts will be made to offer the patient care coordination information and services.   Encounter Outcome:  No Answer  Hooks  Direct Dial: [phone]

## 2022-11-19 ENCOUNTER — Emergency Department (HOSPITAL_COMMUNITY): Payer: Medicare HMO

## 2022-11-19 ENCOUNTER — Other Ambulatory Visit: Payer: Self-pay

## 2022-11-19 ENCOUNTER — Inpatient Hospital Stay (HOSPITAL_COMMUNITY)
Admission: EM | Admit: 2022-11-19 | Discharge: 2022-11-24 | DRG: 918 | Disposition: A | Discharge status: Home / Self Care | Payer: Medicare HMO | Attending: Internal Medicine | Admitting: Internal Medicine

## 2022-11-19 ENCOUNTER — Encounter (HOSPITAL_COMMUNITY): Payer: Self-pay

## 2022-11-19 DIAGNOSIS — Z6833 Body mass index (BMI) 33.0-33.9, adult: Secondary | ICD-10-CM | POA: Diagnosis not present

## 2022-11-19 DIAGNOSIS — Z888 Allergy status to other drugs, medicaments and biological substances status: Secondary | ICD-10-CM

## 2022-11-19 DIAGNOSIS — I110 Hypertensive heart disease with heart failure: Secondary | ICD-10-CM | POA: Diagnosis not present

## 2022-11-19 DIAGNOSIS — Z720 Tobacco use: Secondary | ICD-10-CM | POA: Diagnosis not present

## 2022-11-19 DIAGNOSIS — R0789 Other chest pain: Secondary | ICD-10-CM | POA: Diagnosis present

## 2022-11-19 DIAGNOSIS — I25119 Atherosclerotic heart disease of native coronary artery with unspecified angina pectoris: Secondary | ICD-10-CM | POA: Diagnosis present

## 2022-11-19 DIAGNOSIS — I10 Essential (primary) hypertension: Secondary | ICD-10-CM | POA: Diagnosis present

## 2022-11-19 DIAGNOSIS — K649 Unspecified hemorrhoids: Secondary | ICD-10-CM | POA: Diagnosis present

## 2022-11-19 DIAGNOSIS — I25118 Atherosclerotic heart disease of native coronary artery with other forms of angina pectoris: Secondary | ICD-10-CM | POA: Diagnosis not present

## 2022-11-19 DIAGNOSIS — I5032 Chronic diastolic (congestive) heart failure: Secondary | ICD-10-CM | POA: Diagnosis not present

## 2022-11-19 DIAGNOSIS — Z955 Presence of coronary angioplasty implant and graft: Secondary | ICD-10-CM

## 2022-11-19 DIAGNOSIS — K760 Fatty (change of) liver, not elsewhere classified: Secondary | ICD-10-CM | POA: Diagnosis not present

## 2022-11-19 DIAGNOSIS — R079 Chest pain, unspecified: Principal | ICD-10-CM | POA: Diagnosis present

## 2022-11-19 DIAGNOSIS — I252 Old myocardial infarction: Secondary | ICD-10-CM | POA: Diagnosis not present

## 2022-11-19 DIAGNOSIS — I255 Ischemic cardiomyopathy: Secondary | ICD-10-CM | POA: Diagnosis present

## 2022-11-19 DIAGNOSIS — K551 Chronic vascular disorders of intestine: Secondary | ICD-10-CM | POA: Diagnosis not present

## 2022-11-19 DIAGNOSIS — R109 Unspecified abdominal pain: Secondary | ICD-10-CM | POA: Diagnosis not present

## 2022-11-19 DIAGNOSIS — E871 Hypo-osmolality and hyponatremia: Secondary | ICD-10-CM | POA: Diagnosis not present

## 2022-11-19 DIAGNOSIS — Z8674 Personal history of sudden cardiac arrest: Secondary | ICD-10-CM

## 2022-11-19 DIAGNOSIS — F419 Anxiety disorder, unspecified: Secondary | ICD-10-CM | POA: Diagnosis present

## 2022-11-19 DIAGNOSIS — E785 Hyperlipidemia, unspecified: Secondary | ICD-10-CM | POA: Diagnosis present

## 2022-11-19 DIAGNOSIS — G43909 Migraine, unspecified, not intractable, without status migrainosus: Secondary | ICD-10-CM | POA: Diagnosis present

## 2022-11-19 DIAGNOSIS — Z880 Allergy status to penicillin: Secondary | ICD-10-CM

## 2022-11-19 DIAGNOSIS — F1410 Cocaine abuse, uncomplicated: Secondary | ICD-10-CM | POA: Diagnosis present

## 2022-11-19 DIAGNOSIS — F1910 Other psychoactive substance abuse, uncomplicated: Secondary | ICD-10-CM | POA: Diagnosis not present

## 2022-11-19 DIAGNOSIS — Z8249 Family history of ischemic heart disease and other diseases of the circulatory system: Secondary | ICD-10-CM | POA: Diagnosis not present

## 2022-11-19 DIAGNOSIS — R000 Tachycardia, unspecified: Secondary | ICD-10-CM | POA: Diagnosis not present

## 2022-11-19 DIAGNOSIS — R457 State of emotional shock and stress, unspecified: Secondary | ICD-10-CM | POA: Diagnosis not present

## 2022-11-19 DIAGNOSIS — R1013 Epigastric pain: Secondary | ICD-10-CM | POA: Diagnosis not present

## 2022-11-19 DIAGNOSIS — E876 Hypokalemia: Secondary | ICD-10-CM | POA: Diagnosis not present

## 2022-11-19 DIAGNOSIS — F32A Depression, unspecified: Secondary | ICD-10-CM | POA: Diagnosis present

## 2022-11-19 DIAGNOSIS — Z79899 Other long term (current) drug therapy: Secondary | ICD-10-CM

## 2022-11-19 DIAGNOSIS — K219 Gastro-esophageal reflux disease without esophagitis: Secondary | ICD-10-CM | POA: Diagnosis present

## 2022-11-19 DIAGNOSIS — E669 Obesity, unspecified: Secondary | ICD-10-CM | POA: Diagnosis not present

## 2022-11-19 DIAGNOSIS — T405X1A Poisoning by cocaine, accidental (unintentional), initial encounter: Secondary | ICD-10-CM | POA: Diagnosis not present

## 2022-11-19 DIAGNOSIS — R768 Other specified abnormal immunological findings in serum: Secondary | ICD-10-CM | POA: Diagnosis present

## 2022-11-19 DIAGNOSIS — E860 Dehydration: Secondary | ICD-10-CM | POA: Diagnosis present

## 2022-11-19 DIAGNOSIS — G8929 Other chronic pain: Secondary | ICD-10-CM | POA: Diagnosis present

## 2022-11-19 DIAGNOSIS — Z7982 Long term (current) use of aspirin: Secondary | ICD-10-CM

## 2022-11-19 DIAGNOSIS — F1020 Alcohol dependence, uncomplicated: Secondary | ICD-10-CM | POA: Diagnosis not present

## 2022-11-19 DIAGNOSIS — Z8719 Personal history of other diseases of the digestive system: Secondary | ICD-10-CM

## 2022-11-19 DIAGNOSIS — Z811 Family history of alcohol abuse and dependence: Secondary | ICD-10-CM

## 2022-11-19 DIAGNOSIS — I5033 Acute on chronic diastolic (congestive) heart failure: Secondary | ICD-10-CM

## 2022-11-19 DIAGNOSIS — I2510 Atherosclerotic heart disease of native coronary artery without angina pectoris: Secondary | ICD-10-CM | POA: Diagnosis present

## 2022-11-19 DIAGNOSIS — D500 Iron deficiency anemia secondary to blood loss (chronic): Secondary | ICD-10-CM | POA: Diagnosis not present

## 2022-11-19 DIAGNOSIS — F1210 Cannabis abuse, uncomplicated: Secondary | ICD-10-CM | POA: Diagnosis present

## 2022-11-19 DIAGNOSIS — F1029 Alcohol dependence with unspecified alcohol-induced disorder: Secondary | ICD-10-CM | POA: Diagnosis not present

## 2022-11-19 DIAGNOSIS — I739 Peripheral vascular disease, unspecified: Secondary | ICD-10-CM | POA: Diagnosis present

## 2022-11-19 DIAGNOSIS — R1084 Generalized abdominal pain: Secondary | ICD-10-CM | POA: Diagnosis not present

## 2022-11-19 DIAGNOSIS — F17210 Nicotine dependence, cigarettes, uncomplicated: Secondary | ICD-10-CM | POA: Diagnosis present

## 2022-11-19 LAB — RAPID URINE DRUG SCREEN, HOSP PERFORMED
Amphetamines: NOT DETECTED
Barbiturates: NOT DETECTED
Benzodiazepines: NOT DETECTED
Cocaine: POSITIVE — AB
Opiates: NOT DETECTED
Tetrahydrocannabinol: POSITIVE — AB

## 2022-11-19 LAB — CBC WITH DIFFERENTIAL/PLATELET
Abs Immature Granulocytes: 0.02 10*3/uL (ref 0.00–0.07)
Basophils Absolute: 0 10*3/uL (ref 0.0–0.1)
Basophils Relative: 1 %
Eosinophils Absolute: 0.1 10*3/uL (ref 0.0–0.5)
Eosinophils Relative: 1 %
HCT: 34.8 % — ABNORMAL LOW (ref 39.0–52.0)
Hemoglobin: 11.6 g/dL — ABNORMAL LOW (ref 13.0–17.0)
Immature Granulocytes: 0 %
Lymphocytes Relative: 18 %
Lymphs Abs: 1.5 10*3/uL (ref 0.7–4.0)
MCH: 32 pg (ref 26.0–34.0)
MCHC: 33.3 g/dL (ref 30.0–36.0)
MCV: 96.1 fL (ref 80.0–100.0)
Monocytes Absolute: 0.8 10*3/uL (ref 0.1–1.0)
Monocytes Relative: 9 %
Neutro Abs: 5.8 10*3/uL (ref 1.7–7.7)
Neutrophils Relative %: 71 %
Platelets: 161 10*3/uL (ref 150–400)
RBC: 3.62 MIL/uL — ABNORMAL LOW (ref 4.22–5.81)
RDW: 19.5 % — ABNORMAL HIGH (ref 11.5–15.5)
WBC: 8.2 10*3/uL (ref 4.0–10.5)
nRBC: 0 % (ref 0.0–0.2)

## 2022-11-19 LAB — COMPREHENSIVE METABOLIC PANEL
ALT: 29 U/L (ref 0–44)
AST: 46 U/L — ABNORMAL HIGH (ref 15–41)
Albumin: 3.4 g/dL — ABNORMAL LOW (ref 3.5–5.0)
Alkaline Phosphatase: 229 U/L — ABNORMAL HIGH (ref 38–126)
Anion gap: 13 (ref 5–15)
BUN: <5 mg/dL — ABNORMAL LOW (ref 6–20)
CO2: 20 mmol/L — ABNORMAL LOW (ref 22–32)
Calcium: 9.2 mg/dL (ref 8.9–10.3)
Chloride: 104 mmol/L (ref 98–111)
Creatinine, Ser: 0.55 mg/dL — ABNORMAL LOW (ref 0.61–1.24)
GFR, Estimated: >60 mL/min (ref >60)
Glucose, Bld: 111 mg/dL — ABNORMAL HIGH (ref 70–99)
Potassium: 3.5 mmol/L (ref 3.5–5.1)
Sodium: 137 mmol/L (ref 135–145)
Total Bilirubin: 1.5 mg/dL — ABNORMAL HIGH (ref 0.3–1.2)
Total Protein: 7.9 g/dL (ref 6.5–8.1)

## 2022-11-19 LAB — TROPONIN I (HIGH SENSITIVITY)
Troponin I (High Sensitivity): 10 ng/L (ref <18)
Troponin I (High Sensitivity): 9 ng/L (ref <18)

## 2022-11-19 LAB — LIPASE, BLOOD: Lipase: 38 U/L (ref 11–51)

## 2022-11-19 MED ORDER — LORAZEPAM 1 MG PO TABS
1.0000 mg | ORAL_TABLET | ORAL | Status: AC | PRN
  Administered 2022-11-19 (×2): 1 mg via ORAL
  Filled 2022-11-19 (×2): qty 1

## 2022-11-19 MED ORDER — ONDANSETRON HCL 4 MG/2ML IJ SOLN: 4.0000 mg | Freq: Four times a day (QID) | INTRAMUSCULAR | Status: DC | PRN

## 2022-11-19 MED ORDER — THIAMINE MONONITRATE 100 MG PO TABS
100.0000 mg | ORAL_TABLET | Freq: Every day | ORAL | Status: DC
  Administered 2022-11-19 – 2022-11-24 (×6): 100 mg via ORAL
  Filled 2022-11-19 (×6): qty 1

## 2022-11-19 MED ORDER — ACETAMINOPHEN 325 MG PO TABS
650.0000 mg | ORAL_TABLET | Freq: Four times a day (QID) | ORAL | Status: DC | PRN
  Administered 2022-11-22 – 2022-11-24 (×7): 650 mg via ORAL
  Filled 2022-11-19 (×7): qty 2

## 2022-11-19 MED ORDER — HYDROMORPHONE HCL 1 MG/ML IJ SOLN
0.5000 mg | Freq: Once | INTRAMUSCULAR | Status: AC
  Administered 2022-11-19: 0.5 mg via INTRAVENOUS
  Filled 2022-11-19: qty 0.5

## 2022-11-19 MED ORDER — ENOXAPARIN SODIUM 40 MG/0.4ML IJ SOSY: 40.0000 mg | PREFILLED_SYRINGE | INTRAMUSCULAR | Status: DC

## 2022-11-19 MED ORDER — ROSUVASTATIN CALCIUM 20 MG PO TABS
40.0000 mg | ORAL_TABLET | Freq: Every day | ORAL | Status: DC
  Administered 2022-11-19 – 2022-11-24 (×6): 40 mg via ORAL
  Filled 2022-11-19 (×6): qty 2

## 2022-11-19 MED ORDER — FERROUS SULFATE 325 (65 FE) MG PO TABS
325.0000 mg | ORAL_TABLET | Freq: Every day | ORAL | Status: DC
  Administered 2022-11-20 – 2022-11-24 (×5): 325 mg via ORAL
  Filled 2022-11-19 (×5): qty 1

## 2022-11-19 MED ORDER — PANTOPRAZOLE SODIUM 40 MG PO TBEC
40.0000 mg | DELAYED_RELEASE_TABLET | Freq: Every day | ORAL | Status: DC
  Administered 2022-11-19 – 2022-11-20 (×2): 40 mg via ORAL
  Filled 2022-11-19 (×2): qty 1

## 2022-11-19 MED ORDER — EZETIMIBE 10 MG PO TABS
10.0000 mg | ORAL_TABLET | Freq: Every day | ORAL | Status: DC
  Administered 2022-11-19 – 2022-11-24 (×6): 10 mg via ORAL
  Filled 2022-11-19 (×9): qty 1

## 2022-11-19 MED ORDER — ASPIRIN 81 MG PO CHEW
81.0000 mg | CHEWABLE_TABLET | Freq: Every day | ORAL | Status: DC
  Administered 2022-11-19 – 2022-11-24 (×6): 81 mg via ORAL
  Filled 2022-11-19 (×6): qty 1

## 2022-11-19 MED ORDER — GABAPENTIN 300 MG PO CAPS
300.0000 mg | ORAL_CAPSULE | Freq: Three times a day (TID) | ORAL | Status: DC
  Administered 2022-11-19 – 2022-11-24 (×16): 300 mg via ORAL
  Filled 2022-11-19 (×16): qty 1

## 2022-11-19 MED ORDER — ACETAMINOPHEN 650 MG RE SUPP: 650.0000 mg | Freq: Four times a day (QID) | RECTAL | Status: DC | PRN

## 2022-11-19 MED ORDER — FOLIC ACID 1 MG PO TABS
1.0000 mg | ORAL_TABLET | Freq: Every day | ORAL | Status: DC
  Administered 2022-11-19 – 2022-11-24 (×4): 1 mg via ORAL
  Filled 2022-11-19 (×6): qty 1

## 2022-11-19 MED ORDER — LORAZEPAM 2 MG/ML IJ SOLN: 1.0000 mg | INTRAMUSCULAR | Status: AC | PRN

## 2022-11-19 MED ORDER — FOLIC ACID 1 MG PO TABS
1.0000 mg | ORAL_TABLET | Freq: Every day | ORAL | Status: DC
  Administered 2022-11-19 – 2022-11-24 (×5): 1 mg via ORAL
  Filled 2022-11-19 (×4): qty 1

## 2022-11-19 MED ORDER — FENTANYL CITRATE PF 50 MCG/ML IJ SOSY
50.0000 ug | PREFILLED_SYRINGE | Freq: Once | INTRAMUSCULAR | Status: AC
  Administered 2022-11-19: 50 ug via INTRAVENOUS
  Filled 2022-11-19: qty 1

## 2022-11-19 MED ORDER — SODIUM CHLORIDE 0.9% FLUSH: 3.0000 mL | INTRAVENOUS | Status: DC | PRN

## 2022-11-19 MED ORDER — ONDANSETRON HCL 4 MG PO TABS: 4.0000 mg | ORAL_TABLET | Freq: Four times a day (QID) | ORAL | Status: DC | PRN

## 2022-11-19 MED ORDER — ADULT MULTIVITAMIN W/MINERALS CH
1.0000 | ORAL_TABLET | Freq: Every day | ORAL | Status: DC
  Administered 2022-11-19 – 2022-11-24 (×6): 1 via ORAL
  Filled 2022-11-19 (×6): qty 1

## 2022-11-19 MED ORDER — NITROGLYCERIN 0.4 MG SL SUBL
0.4000 mg | SUBLINGUAL_TABLET | SUBLINGUAL | Status: DC | PRN
  Administered 2022-11-19: 0.4 mg via SUBLINGUAL
  Filled 2022-11-19: qty 1

## 2022-11-19 MED ORDER — ISOSORBIDE MONONITRATE ER 30 MG PO TB24
15.0000 mg | ORAL_TABLET | Freq: Every day | ORAL | Status: DC
  Administered 2022-11-19 – 2022-11-24 (×5): 15 mg via ORAL
  Filled 2022-11-19 (×6): qty 1

## 2022-11-19 MED ORDER — FUROSEMIDE 40 MG PO TABS
40.0000 mg | ORAL_TABLET | Freq: Two times a day (BID) | ORAL | Status: DC
  Administered 2022-11-19 – 2022-11-20 (×2): 40 mg via ORAL
  Filled 2022-11-19 (×2): qty 1

## 2022-11-19 MED ORDER — MORPHINE SULFATE (PF) 2 MG/ML IV SOLN
1.0000 mg | INTRAVENOUS | Status: DC | PRN
  Administered 2022-11-19 – 2022-11-20 (×11): 1 mg via INTRAVENOUS
  Filled 2022-11-19 (×11): qty 1

## 2022-11-19 MED ORDER — THIAMINE HCL 100 MG/ML IJ SOLN: 100.0000 mg | Freq: Every day | INTRAMUSCULAR | Status: DC

## 2022-11-19 MED ORDER — SODIUM CHLORIDE 0.9 % IV SOLN: 250.0000 mL | INTRAVENOUS | Status: DC | PRN

## 2022-11-19 MED ORDER — POLYETHYLENE GLYCOL 3350 17 G PO PACK
17.0000 g | PACK | Freq: Every day | ORAL | Status: DC
  Administered 2022-11-19 – 2022-11-24 (×6): 17 g via ORAL
  Filled 2022-11-19 (×6): qty 1

## 2022-11-19 MED ORDER — SODIUM CHLORIDE 0.9% FLUSH
3.0000 mL | Freq: Two times a day (BID) | INTRAVENOUS | Status: DC
  Administered 2022-11-19 – 2022-11-24 (×10): 3 mL via INTRAVENOUS

## 2022-11-19 MED ORDER — NICOTINE 14 MG/24HR TD PT24
14.0000 mg | MEDICATED_PATCH | Freq: Every day | TRANSDERMAL | Status: DC
  Administered 2022-11-19 – 2022-11-24 (×6): 14 mg via TRANSDERMAL
  Filled 2022-11-19 (×6): qty 1

## 2022-11-19 NOTE — H&P (Addendum)
History and Physical    Ethan Wells XBD:[id number] DOB: [birth year] DOA: 11/19/2022  PCP: Nickola Major, NP   Patient coming from: Home  Chief Complaint: Chest pain  HPI: Ethan Wells is a 51 y.o. male with medical history significant for CAD with prior PCI and recent NSTEMI 10/2022 with severe two-vessel CAD and stent avoided due to ongoing rectal bleed, PAD status post SMA stenting, dyslipidemia, hypertension, cocaine use, alcohol abuse, chronic anemia, and rectal bleeding status post hemorrhoidectomy with repeat ligation on 10/31/2022 who presented to the ED with complaints of chest pain over the last 2 days.  He was recently hospitalized at [facility] and had NSTEMI and cardiac catheterization with severe occlusions, but only medical therapy was recommended given his ongoing bleeding issues.  He was also noted to have rectal bleeding and he underwent repeat ligation of hemorrhoids as well.  He was also treated for acute CHF exacerbation at that time.  He states that he has not only had chest pain over the last 2 days, but some recurrent rectal bleeding and recent cocaine use.  He states that he has had ongoing pain ever since his recent hospitalization and his chest as well as into his back.  He denies any shortness of breath, orthopnea, dyspnea on exertion, or pitting edema.  He states that he has been compliant with his medications and has been living in a camper at his friend's house and states that he has not had much heat.  He states that he drinks 5-6 alcoholic drinks on a daily basis that appears to be hard liquor based mixed with iced tea.   ED Course: Vital signs have been stable in the ED and hemoglobin 11.6 with prior 9.5.  Troponin is within normal limits and UDS is pending.  EKG shows some sinus tachycardia with no other acute abnormalities and right upper quadrant ultrasound with no findings to suggest cholecystitis.  He is currently asking for some pain  medications.  Review of Systems: Reviewed as noted above, otherwise negative.  Past Medical History:  Diagnosis Date   CAD (coronary artery disease)    a. 01/2011 Anterior STEMI/Cath/PCI: LM nl, LAD 100d (3.5x85m Vision BMS placed), LCX 273mRI 50, RCA min irregs, EF 40% w/ apical, inferoapical HK.   Cardiac arrest - ventricular fibrillation    a. In setting of STEMI 01/2011   Chronic pain    Cocaine abuse (HCPine   Depression with anxiety    ETOH abuse    a. 6-12 beers/day   GERD (gastroesophageal reflux disease)    Hemorrhoids    Hyperlipidemia    Hypertension    Ischemic cardiomyopathy    a. 06/2011 Echo: EF 45-50%, No rwma   Marijuana abuse    Migraines    Tobacco abuse    a. 1/2 ppd x 26 yrs    Past Surgical History:  Procedure Laterality Date   COLONOSCOPY  08/09/2021   Procedure: COLONOSCOPY;  Surgeon: WoLucilla LameMD;  [facility];  Service: Endoscopy;;   CORONARY ANGIOPLASTY WITH STENT PLACEMENT     ESOPHAGOGASTRODUODENOSCOPY N/A 05/30/2022   Procedure: ESOPHAGOGASTRODUODENOSCOPY (EGD);  Surgeon: VaLin LandsmanMD;  [facility];  Service: Gastroenterology;  Laterality: N/A;   ESOPHAGOGASTRODUODENOSCOPY (EGD) WITH PROPOFOL N/A 03/01/2021   Procedure: ESOPHAGOGASTRODUODENOSCOPY (EGD) WITH PROPOFOL;  Surgeon: VaLin LandsmanMD;  [place];  Laterality: N/A;   ESOPHAGOGASTRODUODENOSCOPY (EGD) WITH PROPOFOL N/A 08/09/2021   Procedure: ESOPHAGOGASTRODUODENOSCOPY (EGD) WITH  PROPOFOL;  Surgeon: Lucilla Lame, MD;  Location: [facility] ENDOSCOPY;  Service: Endoscopy;  Laterality: N/A;   EVALUATION UNDER ANESTHESIA WITH HEMORRHOIDECTOMY N/A 10/31/2022   Procedure: EXAM UNDER ANESTHESIA WITH SUTURE LIGATION OF TWO BLEEDING PEDICLES;  Surgeon: Olean Ree, MD;  [facility];  Service: General;  Laterality: N/A;   FLEXIBLE SIGMOIDOSCOPY N/A 10/02/2022   Procedure: FLEXIBLE SIGMOIDOSCOPY;  Surgeon: Ladene Artist, MD;  Location: [place];  Service: Gastroenterology;  Laterality: N/A;   LEFT HEART CATH AND CORONARY ANGIOGRAPHY N/A 11/04/2022   Procedure: LEFT HEART CATH AND CORONARY ANGIOGRAPHY;  Surgeon: Wellington Hampshire, MD;  Location: [place] [facility];  Service: Cardiovascular;  Laterality: N/A;   VISCERAL ANGIOGRAPHY N/A 10/24/2022   Procedure: VISCERAL ANGIOGRAPHY;  Surgeon: Algernon Huxley, MD;  Location: [place] [facility];  Service: Cardiovascular;  Laterality: N/A;     reports that he has been smoking cigarettes. He has a 20.50 pack-year smoking history. He has never used smokeless tobacco. He reports current alcohol use of about 12.0 standard drinks of alcohol per week. He reports that he does not currently use drugs after having used the following drugs: Cocaine and Marijuana.  Allergies  Allergen Reactions   Penicillins Hives    Tolerated Ceftriaxone 10/2022   Other Rash    chives    Family History  Problem Relation Age of Onset   Coronary artery disease Mother        alive   Other Other        2 sisters alive and well   Alcohol abuse Father        died @ 40   Other Paternal Grandmother        PACER    Prior to Admission medications   Medication Sig Start Date End Date Taking? Authorizing Provider  acetaminophen (TYLENOL) 500 MG tablet Take 2 tablets (1,000 mg total) by mouth every 6 (six) hours as needed for mild pain. 10/28/22  Yes Piscoya, Jacqulyn Bath, MD  aspirin 81 MG chewable tablet Chew 1 tablet (81 mg total) by mouth daily. 11/06/22 02/04/23 Yes Enzo Bi, MD  gabapentin (NEURONTIN) 300 MG capsule Take 1 capsule (300 mg total) by mouth 3 (three) times daily. 10/17/22  Yes Medina-Vargas, Monina C, NP  metoprolol tartrate (LOPRESSOR) 25 MG tablet Take 0.5 tablets (12.5 mg total) by mouth 2 (two) times daily. 11/08/22 02/06/23 Yes Enzo Bi, MD  nitroGLYCERIN (NITROSTAT) 0.4 MG SL tablet Place 1 tablet (0.4 mg total) under the tongue every 5 (five) minutes as needed  for chest pain. 11/06/22  Yes Enzo Bi, MD  pantoprazole (PROTONIX) 40 MG tablet Take 1 tablet (40 mg total) by mouth daily. 09/23/22  Yes Medina-Vargas, Monina C, NP  rosuvastatin (CRESTOR) 40 MG tablet Take 1 tablet (40 mg total) by mouth daily. 09/23/22  Yes Medina-Vargas, Monina C, NP  calcium-vitamin D (OSCAL WITH D) 500-5 MG-MCG tablet Take 1 tablet by mouth 2 (two) times daily. Patient not taking: Reported on 11/15/2022 10/17/22   Medina-Vargas, Monina C, NP  ezetimibe (ZETIA) 10 MG tablet Take 10 mg by mouth daily. Patient not taking: Reported on 11/19/2022    [provider]  ferrous sulfate 325 (65 FE) MG tablet Take 1 tablet (325 mg total) by mouth daily with breakfast. Patient not taking: Reported on 11/15/2022 11/06/22 02/04/23  Enzo Bi, MD  folic acid (FOLVITE) 1 MG tablet Take 1 tablet (1 mg total) by mouth daily. Patient not taking: Reported on 11/15/2022 11/06/22  Enzo Bi, MD  furosemide (LASIX) 40 MG tablet Take 1 tablet (40 mg total) by mouth 2 (two) times daily. Patient not taking: Reported on 11/19/2022 11/06/22 02/04/23  Enzo Bi, MD  ibuprofen (ADVIL) 800 MG tablet Take 1 tablet (800 mg total) by mouth every 8 (eight) hours as needed for moderate pain. Patient taking differently: Take 800 mg by mouth every 8 (eight) hours as needed for moderate pain. Taking what he has from hospital but insurance wont cover 10/28/22   Olean Ree, MD  lidocaine (XYLOCAINE) 5 % ointment Apply 1 Application topically 4 (four) times daily as needed. Apply to the perianal area Patient not taking: Reported on 11/19/2022 10/28/22   Olean Ree, MD  lisinopril (ZESTRIL) 5 MG tablet Take 5 mg by mouth daily. Patient not taking: Reported on 11/19/2022    [provider]  magnesium oxide (MAG-OX) 400 (240 Mg) MG tablet Take 1 tablet (400 mg total) by mouth 2 (two) times daily. Patient not taking: Reported on 11/15/2022 11/06/22   Enzo Bi, MD  methocarbamol (ROBAXIN-750) 750 MG  tablet Take 1 tablet (750 mg total) by mouth daily as needed for muscle spasms. Patient not taking: Reported on 11/19/2022 11/15/22   Medina-Vargas, Monina C, NP  Multiple Vitamins-Minerals (MULTIVITAMIN) tablet Take 1 tablet by mouth daily. Patient not taking: Reported on 11/19/2022 11/06/22 02/04/23  Enzo Bi, MD  nicotine (NICODERM CQ - DOSED IN MG/24 HOURS) 14 mg/24hr patch Place 1 patch (14 mg total) onto the skin daily. Patient not taking: Reported on 11/15/2022 11/06/22   Enzo Bi, MD  polyethylene glycol (MIRALAX / GLYCOLAX) 17 g packet Take 17 g by mouth daily. Patient not taking: Reported on 11/15/2022 11/06/22 02/04/23  Enzo Bi, MD  psyllium (HYDROCIL/METAMUCIL) 95 % PACK Take 1 packet by mouth daily. Patient not taking: Reported on 11/19/2022 11/06/22 02/04/23  Enzo Bi, MD  thiamine (VITAMIN B-1) 100 MG tablet Take 1 tablet (100 mg total) by mouth daily. Patient not taking: Reported on 11/15/2022 11/06/22   Enzo Bi, MD    Physical Exam: Vitals:   11/19/22 1100 11/19/22 1130 11/19/22 1200 11/19/22 1230  BP: 128/80 (!) 151/94 (!) 151/89 (!) 149/77  Pulse: 85 (!) 104 98 (!) 102  Resp: 20 19 (!) 24 (!) 21  Temp:      TempSrc:      SpO2: 100% 97% 100% 100%  Weight:      Height:        Constitutional: NAD, calm, comfortable Vitals:   11/19/22 1100 11/19/22 1130 11/19/22 1200 11/19/22 1230  BP: 128/80 (!) 151/94 (!) 151/89 (!) 149/77  Pulse: 85 (!) 104 98 (!) 102  Resp: 20 19 (!) 24 (!) 21  Temp:      TempSrc:      SpO2: 100% 97% 100% 100%  Weight:      Height:       Eyes: lids and conjunctivae normal Neck: normal, supple Respiratory: clear to auscultation bilaterally. Normal respiratory effort. No accessory muscle use.  Cardiovascular: Regular rate and rhythm, no murmurs. Abdomen: no tenderness, no distention. Bowel sounds positive.  Musculoskeletal:  No edema. Skin: no rashes, lesions, ulcers.  Psychiatric: Flat affect  Labs on Admission: I have personally  reviewed following labs and imaging studies  CBC: Recent Labs  Lab 11/19/22 0716  WBC 8.2  NEUTROABS 5.8  HGB 11.6*  HCT 34.8*  MCV 96.1  PLT 812   Basic Metabolic Panel: Recent Labs  Lab 11/19/22 0716  NA 137  K 3.5  CL 104  CO2 20*  GLUCOSE 111*  BUN <5*  CREATININE 0.55*  CALCIUM 9.2   GFR: Estimated Creatinine Clearance: 114.4 mL/min (A) (by C-G formula based on SCr of 0.55 mg/dL (L)). Liver Function Tests: Recent Labs  Lab 11/19/22 0716  AST 46*  ALT 29  ALKPHOS 229*  BILITOT 1.5*  PROT 7.9  ALBUMIN 3.4*   Recent Labs  Lab 11/19/22 0716  LIPASE 38   No results for input(s): "AMMONIA" in the last 168 hours. Coagulation Profile: No results for input(s): "INR", "PROTIME" in the last 168 hours. Cardiac Enzymes: No results for input(s): "CKTOTAL", "CKMB", "CKMBINDEX", "TROPONINI" in the last 168 hours. BNP (last 3 results) No results for input(s): "PROBNP" in the last 8760 hours. HbA1C: No results for input(s): "HGBA1C" in the last 72 hours. CBG: No results for input(s): "GLUCAP" in the last 168 hours. Lipid Profile: No results for input(s): "CHOL", "HDL", "LDLCALC", "TRIG", "CHOLHDL", "LDLDIRECT" in the last 72 hours. Thyroid Function Tests: No results for input(s): "TSH", "T4TOTAL", "FREET4", "T3FREE", "THYROIDAB" in the last 72 hours. Anemia Panel: No results for input(s): "VITAMINB12", "FOLATE", "FERRITIN", "TIBC", "IRON", "RETICCTPCT" in the last 72 hours. Urine analysis:    Component Value Date/Time   COLORURINE STRAW (A) 09/30/2022 2225   APPEARANCEUR CLEAR 09/30/2022 2225   APPEARANCEUR Clear 03/16/2012 1315   LABSPEC 1.012 09/30/2022 2225   LABSPEC 1.013 03/16/2012 1315   PHURINE 9.0 (H) 09/30/2022 2225   GLUCOSEU NEGATIVE 09/30/2022 2225   GLUCOSEU 50 mg/dL 03/16/2012 1315   HGBUR NEGATIVE 09/30/2022 [place] 09/30/2022 2225   BILIRUBINUR Negative 03/16/2012 1315   KETONESUR NEGATIVE 09/30/2022 2225   PROTEINUR  NEGATIVE 09/30/2022 2225   NITRITE NEGATIVE 09/30/2022 2225   LEUKOCYTESUR NEGATIVE 09/30/2022 2225   LEUKOCYTESUR Negative 03/16/2012 1315    Radiological Exams on Admission: US Abdomen Limited RUQ (LIVER/GB)  Result Date: 11/19/2022 CLINICAL DATA:  Abdominal pain EXAM: ULTRASOUND ABDOMEN LIMITED RIGHT UPPER QUADRANT COMPARISON:  None Available. FINDINGS: Gallbladder: No gallstones or wall thickening visualized. No sonographic Murphy sign noted by sonographer. Common bile duct: Diameter: 0.5 cm, which is normal Liver: No focal lesion identified. Increased parenchymal echogenicity, which can with hepatic steatosis. Portal vein is patent on color Doppler imaging with normal direction of blood flow towards the liver. Other: None. IMPRESSION: No sonographic finding to suggest cholecystitis. Electronically Signed   By: Marin Roberts M.D.   On: 11/19/2022 10:32   DG Chest Portable 1 View  Result Date: 11/19/2022 CLINICAL DATA:  Chest pain EXAM: PORTABLE CHEST 1 VIEW COMPARISON:  10/29/2022 FINDINGS: 0737 hours. The lungs are clear without focal pneumonia, edema, pneumothorax or pleural effusion. The cardio pericardial silhouette is enlarged. The visualized bony structures of the thorax are unremarkable. Telemetry leads overlie the chest. IMPRESSION: No active disease. Electronically Signed   By: Misty Stanley M.D.   On: 11/19/2022 07:56    EKG: Independently reviewed. ST 104bpm.  Assessment/Plan Principal Problem:   Chest pain Active Problems:   Mesenteric artery stenosis (HCC)   CAD (coronary artery disease)   Essential hypertension   Cocaine abuse ([place])   Polysubstance abuse (HCC)   Abdominal pain   Alcohol dependence ([place])   Hyperlipidemia   Elevated rheumatoid factor    Ongoing chest pain with atypical features -Recent NSTEMI with cardiac catheterization and discharged on 11/22 -Appreciate cardiology recommendations with plans to continue current medical management with aspirin,  Zetia, Crestor, and Imdur 15 mg daily with close monitoring  of blood pressures  CAD with recent cardiac catheterization -Continue aspirin and statin -Stents avoided during last hospitalizations due to ongoing bleeding issues  Chronic diastolic heart failure -Does not appear volume overloaded -Continue p.o. Lasix 40 mg twice daily -Recent echocardiogram with preserved LVEF 55-60%  Chronic anemia multifactorial in the setting of intermittent rectal bleeding -Noted to have iron deficiency as well as folate deficiency and required 1 unit PRBC transfusion recently -Currently with improved hemoglobin levels, continue to monitor  Mesenteric artery stenosis -Had associated epigastric pain -Stent to SMA 11/9  Dyslipidemia -Continue Crestor  Cocaine use -Counseled on cessation -Likely need to transition to Coreg  Alcohol dependence -No current signs of withdrawal -Placed on CIWA protocol  Current tobacco abuse -Nicotine patch -Counseled on cessation  Elevated rheumatoid factor -Will require rheumatology evaluation outpatient  DVT prophylaxis: SCDs Code Status: Full Family Communication: None at bedside Disposition Plan:Admit for evaluation of chest pain Consults called:Cardiology Admission status: Inpatient, Tele  Severity of Illness: The appropriate patient status for this patient is INPATIENT. Inpatient status is judged to be reasonable and necessary in order to provide the required intensity of service to ensure the patient's safety. The patient's presenting symptoms, physical exam findings, and initial radiographic and laboratory data in the context of their chronic comorbidities is felt to place them at high risk for further clinical deterioration. Furthermore, it is not anticipated that the patient will be medically stable for discharge from the hospital within 2 midnights of admission.   * I certify that at the point of admission it is my clinical judgment that the patient  will require inpatient hospital care spanning beyond 2 midnights from the point of admission due to high intensity of service, high risk for further deterioration and high frequency of surveillance required.*   Alexina Niccoli D Jalisa Sacco DO [facility]  If 7PM-7AM, please contact night-coverage [email or web address]  11/19/2022, 1:10 PM

## 2022-11-19 NOTE — ED Provider Notes (Signed)
[facility] EMERGENCY DEPARTMENT Provider Note   CSN: [id number] Arrival date & time: 11/19/22  6295     History  Chief Complaint  Patient presents with   Chest Pain    Ethan Wells is a 51 y.o. male.   Chest Pain Patient presents with chest pain.  Anterior chest.  Previous to similar anginal pain in his hand.  Recent admission to hospital and had angiography done which showed lesion that was not intervened on due to GI bleeding.  States has not been doing activity at home.  Similar to previous pain has been having.  Also has had some blood in the stool.  States recently had hemorrhoid surgery twice.  States the blood is red and is mixed in the stool and with wiping.  States he last used cocaine on Thursday with today being Tuesday.     Home Medications Prior to Admission medications   Medication Sig Start Date End Date Taking? Authorizing Provider  acetaminophen (TYLENOL) 500 MG tablet Take 2 tablets (1,000 mg total) by mouth every 6 (six) hours as needed for mild pain. 10/28/22   Henrene Dodge, MD  aspirin 81 MG chewable tablet Chew 1 tablet (81 mg total) by mouth daily. 11/06/22 02/04/23  Darlin Priestly, MD  calcium-vitamin D (OSCAL WITH D) 500-5 MG-MCG tablet Take 1 tablet by mouth 2 (two) times daily. Patient not taking: Reported on 11/15/2022 10/17/22   Medina-Vargas, Monina C, NP  ezetimibe (ZETIA) 10 MG tablet Take 10 mg by mouth daily.    [provider]  ferrous sulfate 325 (65 FE) MG tablet Take 1 tablet (325 mg total) by mouth daily with breakfast. Patient not taking: Reported on 11/15/2022 11/06/22 02/04/23  Darlin Priestly, MD  folic acid (FOLVITE) 1 MG tablet Take 1 tablet (1 mg total) by mouth daily. Patient not taking: Reported on 11/15/2022 11/06/22   Darlin Priestly, MD  furosemide (LASIX) 40 MG tablet Take 1 tablet (40 mg total) by mouth 2 (two) times daily. 11/06/22 02/04/23  Darlin Priestly, MD  gabapentin (NEURONTIN) 300 MG capsule Take 1 capsule (300 mg total) by mouth 3  (three) times daily. 10/17/22   Medina-Vargas, Monina C, NP  ibuprofen (ADVIL) 800 MG tablet Take 1 tablet (800 mg total) by mouth every 8 (eight) hours as needed for moderate pain. Patient taking differently: Take 800 mg by mouth every 8 (eight) hours as needed for moderate pain. Taking what he has from hospital but insurance wont cover 10/28/22   Henrene Dodge, MD  lidocaine (XYLOCAINE) 5 % ointment Apply 1 Application topically 4 (four) times daily as needed. Apply to the perianal area 10/28/22   Henrene Dodge, MD  magnesium oxide (MAG-OX) 400 (240 Mg) MG tablet Take 1 tablet (400 mg total) by mouth 2 (two) times daily. Patient not taking: Reported on 11/15/2022 11/06/22   Darlin Priestly, MD  methocarbamol (ROBAXIN-750) 750 MG tablet Take 1 tablet (750 mg total) by mouth daily as needed for muscle spasms. 11/15/22   Medina-Vargas, Monina C, NP  metoprolol tartrate (LOPRESSOR) 25 MG tablet Take 0.5 tablets (12.5 mg total) by mouth 2 (two) times daily. 11/08/22 02/06/23  Darlin Priestly, MD  Multiple Vitamins-Minerals (MULTIVITAMIN) tablet Take 1 tablet by mouth daily. 11/06/22 02/04/23  Darlin Priestly, MD  nicotine (NICODERM CQ - DOSED IN MG/24 HOURS) 14 mg/24hr patch Place 1 patch (14 mg total) onto the skin daily. Patient not taking: Reported on 11/15/2022 11/06/22   Darlin Priestly, MD  nitroGLYCERIN (NITROSTAT) 0.4 MG SL  tablet Place 1 tablet (0.4 mg total) under the tongue every 5 (five) minutes as needed for chest pain. 11/06/22   Darlin Priestly, MD  pantoprazole (PROTONIX) 40 MG tablet Take 1 tablet (40 mg total) by mouth daily. Patient not taking: Reported on 11/15/2022 09/23/22   Medina-Vargas, Monina C, NP  polyethylene glycol (MIRALAX / GLYCOLAX) 17 g packet Take 17 g by mouth daily. Patient not taking: Reported on 11/15/2022 11/06/22 02/04/23  Darlin Priestly, MD  psyllium (HYDROCIL/METAMUCIL) 95 % PACK Take 1 packet by mouth daily. 11/06/22 02/04/23  Darlin Priestly, MD  rosuvastatin (CRESTOR) 40 MG tablet Take 1 tablet (40 mg total)  by mouth daily. 09/23/22   Medina-Vargas, Monina C, NP  thiamine (VITAMIN B-1) 100 MG tablet Take 1 tablet (100 mg total) by mouth daily. Patient not taking: Reported on 11/15/2022 11/06/22   Darlin Priestly, MD      Allergies    Penicillins and Other    Review of Systems   Review of Systems  Cardiovascular:  Positive for chest pain.    Physical Exam Updated Vital Signs BP 122/70   Pulse 99   Temp 98.6 F (37 C) (Oral)   Resp 17   Ht 5\' 5"  (1.651 m)   Wt 90.8 kg   SpO2 100%   BMI 33.31 kg/m  Physical Exam Vitals and nursing note reviewed.  Cardiovascular:     Rate and Rhythm: Regular rhythm.  Pulmonary:     Breath sounds: No wheezing or rhonchi.  Chest:     Chest wall: No tenderness.  Abdominal:     Tenderness: There is no abdominal tenderness.  Musculoskeletal:     Right lower leg: Edema present.     Left lower leg: Edema present.     Comments: Minimal edema bilateral lower extremities.  Skin:    Capillary Refill: Capillary refill takes less than 2 seconds.  Neurological:     Mental Status: He is alert.     ED Results / Procedures / Treatments   Labs (all labs ordered are listed, but only abnormal results are displayed) Labs Reviewed  CBC WITH DIFFERENTIAL/PLATELET - Abnormal; Notable for the following components:      Result Value   RBC 3.62 (*)    Hemoglobin 11.6 (*)    HCT 34.8 (*)    RDW 19.5 (*)    All other components within normal limits  COMPREHENSIVE METABOLIC PANEL - Abnormal; Notable for the following components:   CO2 20 (*)    Glucose, Bld 111 (*)    BUN <5 (*)    Creatinine, Ser 0.55 (*)    Albumin 3.4 (*)    AST 46 (*)    Alkaline Phosphatase 229 (*)    Total Bilirubin 1.5 (*)    All other components within normal limits  LIPASE, BLOOD  TROPONIN I (HIGH SENSITIVITY)  TROPONIN I (HIGH SENSITIVITY)    EKG EKG Interpretation  Date/Time:  Tuesday November 19 2022 06:25:04 EST Ventricular Rate:  106 PR Interval:  141 QRS  Duration: 90 QT Interval:  344 QTC Calculation: 457 R Axis:   85 Text Interpretation: Sinus tachycardia Atrial premature complex Low voltage, precordial leads Minimal ST depression, inferior leads No significant change since last tracing Confirmed by Benjiman Core [phone]) on 11/19/2022 6:56:27 AM  Radiology DG Chest Portable 1 View  Result Date: 11/19/2022 CLINICAL DATA:  Chest pain EXAM: PORTABLE CHEST 1 VIEW COMPARISON:  10/29/2022 FINDINGS: 0737 hours. The lungs are clear without focal pneumonia, edema, pneumothorax  or pleural effusion. The cardio pericardial silhouette is enlarged. The visualized bony structures of the thorax are unremarkable. Telemetry leads overlie the chest. IMPRESSION: No active disease. Electronically Signed   By: Kennith Center M.D.   On: 11/19/2022 07:56    Procedures Procedures    Medications Ordered in ED Medications  nitroGLYCERIN (NITROSTAT) SL tablet 0.4 mg (0.4 mg Sublingual Given 11/19/22 0803)  fentaNYL (SUBLIMAZE) injection 50 mcg (50 mcg Intravenous Given 11/19/22 1610)    ED Course/ Medical Decision Making/ A&P                           Medical Decision Making Amount and/or Complexity of Data Reviewed Radiology: ordered.  Risk Prescription drug management. Decision regarding hospitalization.   Patient with chest pain.  Anterior chest.  Similar to previous chest pain.  Differential diagnosis does include coronary artery disease, ischemia.  Also has had GI bleed/hemorrhoids.  Will get basic blood work EKG.  Initial EKG done and stable to prior.  Chest x-ray also independently interpreted and no clear cause for pain.  Hemoglobin is stable.  No relief with nitroglycerin and no relief fentanyl. Troponins negative x 2.  Blood work reassuring.  However continued pain.  Reviewed records from outside hospital did show potentially some biliary findings.  Ultrasound done and reassuring.  Discussed with Dr. [name] from cardiology.  Feels the patient can  stay at [facility] for further evaluation.  Will admit to hospitalist.         Final Clinical Impression(s) / ED Diagnoses Final diagnoses:  None    Rx / DC Orders ED Discharge Orders     None         Benjiman Core, MD 11/20/22 1009

## 2022-11-19 NOTE — ED Triage Notes (Signed)
BIB [facility] c/o chest pain for 2 days. Hx MI and stent placement. Pt also c/o rectal pain and bleeding.   ETOH on board.

## 2022-11-19 NOTE — Consult Note (Addendum)
Cardiology Consultation   Patient ID: Ethan Wells MRN: [id number]; DOB: July 18, 1971  Admit date: 11/19/2022 Date of Consult: 11/19/2022  PCP:  Nickola Major, NP   [facility] Cardiologist:  Lauree Chandler, MD        [name] is a 51 y.o. male with a hx of w/PMH of CAD (s/p PCI to LAD in 2012, NSTEMI in 10/2022 with cath showing severe 2-vessel CAD with CTO of mid-LAD and mid-RCA with collaterals present and patent LAD stent with medical management recommended given his ongoing rectal bleeding), PAD (s/p SMA stenting), HTN, HLD, cocaine use, alcohol abuse, anemia and rectal bleeding (s/p hemorrhoidectomy with repeat ligation on 10/31/2022) who is being seen 11/19/2022 for the evaluation of chest pain at the request of Dr. Alvino Chapel.  History of Present Illness:   Ethan Wells was recently admitted to [facility] from 11/6 - 11/06/2022 for an NSTEMI with peak troponin values of 1607. He underwent a repeat cardiac catheterization on 11/04/2022 which showed an occluded RCA and occluded LCx with patent LAD stent and severe ramus intermedius stenosis. Medical therapy was recommended initially given his bleeding issues and if he had refractory angina, could consider PCI of the ramus and possibly RCA. His hospitalization was complicated by rectal bleeding and he underwent repeat ligation of hemorrhoids on 10/31/2022. Was also treated for an acute CHF exacerbation with a discharge weight of 199 lbs. From a cardiac perspective, he was discharged on ASA 81 mg daily, Zetia 10 mg daily, Lasix 40 mg twice daily, Crestor 40 mg daily and Lopressor 12.5 mg twice daily. It appears he had been started on Imdur 15 mg daily by cardiology during admission but later held due to hypotension and was not on his discharge summary.  He presented to [facility] ED this morning for evaluation of chest pain for the past 2 days. Also reported having recurrent  rectal bleeding and had used cocaine again last week. In talking with the patient today, it is unclear what acutely prompted him to come to the ED today. Reports that he was "never fixed" at [facility] and has continued to have episodes of pain along with intermittent rectal bleeding. Reports his pain can be along his chest or back and he does experience improvement with pain medications. Reports his rectal bleeding did subside for approximately 1 week but then restarted last week.  He denies any specific dyspnea on exertion, orthopnea, PND or pitting edema.  Reports he has been compliant with medications but is unsure of exactly what he is currently taking.  eports he is living in a camper at a friend's home and is concerned about the cold temperatures given his camper does not have heat.  Initial labs show WBC 8.2, Hgb 11.6 (previously at 9.5 on 11/07/2022), Na+ 137, K+ 3.5 and creatinine 0.55. Initial and repeat troponin values negative at 10 and 9. UDS pending. CXR with no active disease. Limited RUQ abdominal ultrasound with no sonographic findings to suggest cholecystitis. EKG shows sinus tachycardia, HR 106 with ST abnormalities along the inferior and lateral leads having improved when compared to prior tracings last month.   Past Medical History:  Diagnosis Date   CAD (coronary artery disease)    a. 01/2011 Anterior STEMI/Cath/PCI: LM nl, LAD 100d (3.5x80m Vision BMS placed), LCX 278mRI 50, RCA min irregs, EF 40% w/ apical, inferoapical HK.   Cardiac arrest - ventricular fibrillation    a. In setting of STEMI 01/2011  Chronic pain    Cocaine abuse (HCC)    Depression with anxiety    ETOH abuse    a. 6-12 beers/day   GERD (gastroesophageal reflux disease)    Hemorrhoids    Hyperlipidemia    Hypertension    Ischemic cardiomyopathy    a. 06/2011 Echo: EF 45-50%, No rwma   Marijuana abuse    Migraines    Tobacco abuse    a. 1/2 ppd x 26 yrs    Past Surgical History:  Procedure Laterality  Date   COLONOSCOPY  08/09/2021   Procedure: COLONOSCOPY;  Surgeon: Lucilla Lame, MD;  Location: [facility] ENDOSCOPY;  Service: Endoscopy;;   CORONARY ANGIOPLASTY WITH STENT PLACEMENT     ESOPHAGOGASTRODUODENOSCOPY N/A 05/30/2022   Procedure: ESOPHAGOGASTRODUODENOSCOPY (EGD);  Surgeon: Lin Landsman, MD;  Location: [facility] ENDOSCOPY;  Service: Gastroenterology;  Laterality: N/A;   ESOPHAGOGASTRODUODENOSCOPY (EGD) WITH PROPOFOL N/A 03/01/2021   Procedure: ESOPHAGOGASTRODUODENOSCOPY (EGD) WITH PROPOFOL;  Surgeon: Lin Landsman, MD;  Location: [place];  Service: Endoscopy;  Laterality: N/A;   ESOPHAGOGASTRODUODENOSCOPY (EGD) WITH PROPOFOL N/A 08/09/2021   Procedure: ESOPHAGOGASTRODUODENOSCOPY (EGD) WITH PROPOFOL;  Surgeon: Lucilla Lame, MD;  Location: [facility] ENDOSCOPY;  Service: Endoscopy;  Laterality: N/A;   EVALUATION UNDER ANESTHESIA WITH HEMORRHOIDECTOMY N/A 10/31/2022   Procedure: EXAM UNDER ANESTHESIA WITH SUTURE LIGATION OF TWO BLEEDING PEDICLES;  Surgeon: Olean Ree, MD;  Location: [facility] ORS;  Service: General;  Laterality: N/A;   FLEXIBLE SIGMOIDOSCOPY N/A 10/02/2022   Procedure: FLEXIBLE SIGMOIDOSCOPY;  Surgeon: Ladene Artist, MD;  Location: [place];  Service: Gastroenterology;  Laterality: N/A;   LEFT HEART CATH AND CORONARY ANGIOGRAPHY N/A 11/04/2022   Procedure: LEFT HEART CATH AND CORONARY ANGIOGRAPHY;  Surgeon: Wellington Hampshire, MD;  Location: [place] CV LAB;  Service: Cardiovascular;  Laterality: N/A;   VISCERAL ANGIOGRAPHY N/A 10/24/2022   Procedure: VISCERAL ANGIOGRAPHY;  Surgeon: Algernon Huxley, MD;  Location: [place] CV LAB;  Service: Cardiovascular;  Laterality: N/A;     Home Medications:  Prior to Admission medications   Medication Sig Start Date End Date Taking? Authorizing Provider  acetaminophen (TYLENOL) 500 MG tablet Take 2 tablets (1,000 mg total) by mouth every 6 (six) hours as needed for mild pain. 10/28/22   Olean Ree, MD  aspirin  81 MG chewable tablet Chew 1 tablet (81 mg total) by mouth daily. 11/06/22 02/04/23  Enzo Bi, MD  calcium-vitamin D (OSCAL WITH D) 500-5 MG-MCG tablet Take 1 tablet by mouth 2 (two) times daily. Patient not taking: Reported on 11/15/2022 10/17/22   Medina-Vargas, Monina C, NP  ezetimibe (ZETIA) 10 MG tablet Take 10 mg by mouth daily.    [provider]  ferrous sulfate 325 (65 FE) MG tablet Take 1 tablet (325 mg total) by mouth daily with breakfast. Patient not taking: Reported on 11/15/2022 11/06/22 02/04/23  Enzo Bi, MD  folic acid (FOLVITE) 1 MG tablet Take 1 tablet (1 mg total) by mouth daily. Patient not taking: Reported on 11/15/2022 11/06/22   Enzo Bi, MD  furosemide (LASIX) 40 MG tablet Take 1 tablet (40 mg total) by mouth 2 (two) times daily. 11/06/22 02/04/23  Enzo Bi, MD  gabapentin (NEURONTIN) 300 MG capsule Take 1 capsule (300 mg total) by mouth 3 (three) times daily. 10/17/22   Medina-Vargas, Monina C, NP  ibuprofen (ADVIL) 800 MG tablet Take 1 tablet (800 mg total) by mouth every 8 (eight) hours as needed for moderate pain. Patient taking differently: Take 800 mg by mouth  every 8 (eight) hours as needed for moderate pain. Taking what he has from hospital but insurance wont cover 10/28/22   Olean Ree, MD  lidocaine (XYLOCAINE) 5 % ointment Apply 1 Application topically 4 (four) times daily as needed. Apply to the perianal area 10/28/22   Olean Ree, MD  magnesium oxide (MAG-OX) 400 (240 Mg) MG tablet Take 1 tablet (400 mg total) by mouth 2 (two) times daily. Patient not taking: Reported on 11/15/2022 11/06/22   Enzo Bi, MD  methocarbamol (ROBAXIN-750) 750 MG tablet Take 1 tablet (750 mg total) by mouth daily as needed for muscle spasms. 11/15/22   Medina-Vargas, Monina C, NP  metoprolol tartrate (LOPRESSOR) 25 MG tablet Take 0.5 tablets (12.5 mg total) by mouth 2 (two) times daily. 11/08/22 02/06/23  Enzo Bi, MD  Multiple Vitamins-Minerals (MULTIVITAMIN) tablet Take 1  tablet by mouth daily. 11/06/22 02/04/23  Enzo Bi, MD  nicotine (NICODERM CQ - DOSED IN MG/24 HOURS) 14 mg/24hr patch Place 1 patch (14 mg total) onto the skin daily. Patient not taking: Reported on 11/15/2022 11/06/22   Enzo Bi, MD  nitroGLYCERIN (NITROSTAT) 0.4 MG SL tablet Place 1 tablet (0.4 mg total) under the tongue every 5 (five) minutes as needed for chest pain. 11/06/22   Enzo Bi, MD  pantoprazole (PROTONIX) 40 MG tablet Take 1 tablet (40 mg total) by mouth daily. Patient not taking: Reported on 11/15/2022 09/23/22   Medina-Vargas, Monina C, NP  polyethylene glycol (MIRALAX / GLYCOLAX) 17 g packet Take 17 g by mouth daily. Patient not taking: Reported on 11/15/2022 11/06/22 02/04/23  Enzo Bi, MD  psyllium (HYDROCIL/METAMUCIL) 95 % PACK Take 1 packet by mouth daily. 11/06/22 02/04/23  Enzo Bi, MD  rosuvastatin (CRESTOR) 40 MG tablet Take 1 tablet (40 mg total) by mouth daily. 09/23/22   Medina-Vargas, Monina C, NP  thiamine (VITAMIN B-1) 100 MG tablet Take 1 tablet (100 mg total) by mouth daily. Patient not taking: Reported on 11/15/2022 11/06/22   Enzo Bi, MD    Inpatient Medications: Scheduled Meds:  Continuous Infusions:  PRN Meds: nitroGLYCERIN  Allergies:    Allergies  Allergen Reactions   Penicillins Hives    Tolerated Ceftriaxone 10/2022   Other Rash    chives    Social History:   Social History   Socioeconomic History   Marital status: Single    Spouse name: Not on file   Number of children: Not on file   Years of education: Not on file   Highest education level: Not on file  Occupational History   Not on file  Tobacco Use   Smoking status: Every Day    Packs/day: 0.50    Years: 41.00    Total pack years: 20.50    Types: Cigarettes   Smokeless tobacco: Never   Tobacco comments:    started age 77  Vaping Use   Vaping Use: Never used  Substance and Sexual Activity   Alcohol use: Yes    Alcohol/week: 12.0 standard drinks of alcohol    Types: 12  Cans of beer per week    Comment: at least a 6 pack of beer daily and often a 12 pack   Drug use: Not Currently    Types: Cocaine, Marijuana   Sexual activity: Not on file  Other Topics Concern   Not on file  Social History Narrative   Single, lives in [place] with his mother.  He does not routinely exercise.  He sometimes works, helping to deliver appliances to  homes.   Social Determinants of Health   Financial Resource Strain: Not on file  Food Insecurity: Food Insecurity Present (10/22/2022)   Hunger Vital Sign    Worried About Running Out of Food in the Last Year: Sometimes true    Ran Out of Food in the Last Year: Sometimes true  Transportation Needs: No Transportation Needs (10/22/2022)   PRAPARE - Hydrologist (Medical): No    Lack of Transportation (Non-Medical): No  Physical Activity: Not on file  Stress: Not on file  Social Connections: Not on file  Intimate Partner Violence: Not At Risk (10/22/2022)   Humiliation, Afraid, Rape, and Kick questionnaire    Fear of Current or Ex-Partner: No    Emotionally Abused: No    Physically Abused: No    Sexually Abused: No    Family History:    Family History  Problem Relation Age of Onset   Coronary artery disease Mother        alive   Other Other        2 sisters alive and well   Alcohol abuse Father        died @ 29   Other Paternal Grandmother        PACER     ROS:  Please see the history of present illness.   All other ROS reviewed and negative.     Physical Exam/Data:   Vitals:   11/19/22 0900 11/19/22 0930 11/19/22 1000 11/19/22 1009  BP: 121/86 133/84 127/68   Pulse: 84 (!) 107 (!) 105   Resp: (!) 23 (!) 23 (!) 22   Temp:    97.9 F (36.6 C)  TempSrc:    Oral  SpO2: 99% 99% 100%   Weight:      Height:       No intake or output data in the 24 hours ending 11/19/22 1023    11/19/2022    6:24 AM 11/15/2022    2:39 PM 11/08/2022    6:04 AM  Last 3 Weights  Weight (lbs)  200 lb 2.8 oz 200 lb 203 lb 8 oz  Weight (kg) 90.8 kg 90.719 kg 92.307 kg     Body mass index is 33.31 kg/m.  General: Male appearing in no acute distress. Tearful at times.  HEENT: normal Neck: no JVD Vascular: No carotid bruits; Distal pulses 2+ bilaterally Cardiac:  normal S1, S2; RRR; no murmur  Lungs:  clear to auscultation bilaterally, no wheezing, rhonchi or rales  Abd: soft, nontender, no hepatomegaly  Ext: no pitting edema Musculoskeletal:  No deformities, BUE and BLE strength normal and equal Skin: warm and dry  Neuro:  CNs 2-12 intact, no focal abnormalities noted Psych:  Normal affect   EKG:  The EKG was personally reviewed and demonstrates: Sinus tachycardia, HR 106 with ST abnormalities along the inferior and lateral leads having improved when compared to prior tracings last month.  Telemetry:  Telemetry was personally reviewed and demonstrates: NSR and sinus tachycardia, HR in 90's to low-100's.   Relevant CV Studies:  Limited Echo: 10/30/2022 IMPRESSIONS     1. Left ventricular ejection fraction, by estimation, is 55 to 60%. The  left ventricle has normal function. The left ventricle has no regional  wall motion abnormalities.   2. Right ventricular systolic function is normal. The right ventricular  size is normal.   3. The mitral valve is normal in structure. No evidence of mitral valve  regurgitation.   4. The  aortic valve is normal in structure. Aortic valve regurgitation is  not visualized.   5. The inferior vena cava is normal in size with greater than 50%  respiratory variability, suggesting right atrial pressure of 3 mmHg.   LHC: 11/06/2022 Mid Cx lesion is 100% stenosed.   Ramus lesion is 85% stenosed.   Prox LAD to Mid LAD lesion is 5% stenosed.   Prox RCA to Mid RCA lesion is 100% stenosed.   Prox RCA lesion is 80% stenosed.   There is mild left ventricular systolic dysfunction.   LV end diastolic pressure is moderately elevated.   The left  ventricular ejection fraction is 45-50% by visual estimate.   1.  Severe two-vessel coronary artery disease with chronically occluded mid left circumflex and mid right coronary artery with collaterals.  These vessels were patent with mild disease on previous cardiac catheterization in 2012.  LAD stent is patent with minimal restenosis.  In addition, there is progression of proximal to mid ramus stenosis. 2.  Mildly reduced LV systolic function with an EF of 45%. 3.  Moderately elevated left ventricular end-diastolic pressure at 26 mmHg.   Recommendations: The patient has 2 chronic occlusions with collaterals.  Recommend medical therapy for now.  I added small dose Imdur.  Given ongoing rectal bleeding, risks of PCI outweigh the benefit at this time.  In the near future, once his bleeding issues are controlled and if he continues to have anginal symptoms, PCI of the ramus and possibly the right coronary artery can be considered.  The RCA has a microchannel of patent flow. Continue IV furosemide today given elevated LVEDP and switch to oral likely tomorrow.  Laboratory Data:  High Sensitivity Troponin:   Recent Labs  Lab 10/29/22 1204 10/29/22 1433 10/29/22 1636 11/19/22 0716 11/19/22 0837  TROPONINIHS 241* 1,607* 1,418* 10 9     Chemistry Recent Labs  Lab 11/19/22 0716  NA 137  K 3.5  CL 104  CO2 20*  GLUCOSE 111*  BUN <5*  CREATININE 0.55*  CALCIUM 9.2  GFRNONAA >60  ANIONGAP 13    Recent Labs  Lab 11/19/22 0716  PROT 7.9  ALBUMIN 3.4*  AST 46*  ALT 29  ALKPHOS 229*  BILITOT 1.5*   Lipids No results for input(s): "CHOL", "TRIG", "HDL", "LABVLDL", "LDLCALC", "CHOLHDL" in the last 168 hours.  Hematology Recent Labs  Lab 11/19/22 0716  WBC 8.2  RBC 3.62*  HGB 11.6*  HCT 34.8*  MCV 96.1  MCH 32.0  MCHC 33.3  RDW 19.5*  PLT 161   Thyroid No results for input(s): "TSH", "FREET4" in the last 168 hours.  BNPNo results for input(s): "BNP", "PROBNP" in the last  168 hours.  DDimer No results for input(s): "DDIMER" in the last 168 hours.   Radiology/Studies:  DG Chest Portable 1 View  Result Date: 11/19/2022 CLINICAL DATA:  Chest pain EXAM: PORTABLE CHEST 1 VIEW COMPARISON:  10/29/2022 FINDINGS: 0737 hours. The lungs are clear without focal pneumonia, edema, pneumothorax or pleural effusion. The cardio pericardial silhouette is enlarged. The visualized bony structures of the thorax are unremarkable. Telemetry leads overlie the chest. IMPRESSION: No active disease. Electronically Signed   By: Misty Stanley M.D.   On: 11/19/2022 07:56     Assessment and Plan:   1. Chest Pain with Atypical Features - He reports having episodes of chest discomfort ever since his hospitalization but says this was occurring at that time as well and they have not increased in frequency or  severity. He did receive Fentanyl and Dilaudid with improvement in symptoms this morning and is requesting additional pain medications. His pain occurs at rest and is not associated with exertion or positional changes.  Overall, seems atypical for a cardiac etiology. - Troponin values have been negative this admission and EKG actually shows improvement when compared to prior tracings from 10/2022. Given his recent cardiac catheterization, would not anticipate further cardiac procedures this admission. - He was on Lopressor prior to admission and given his cocaine use, would anticipate switching this to Coreg prior to discharge. Can continue ASA, Zetia and Crestor. Would recommend restarting Imdur 15 mg daily and titrating as BP allows.  2. CAD - He is s/p PCI to LAD in 2012 and recently had an NSTEMI in 10/2022 with cath showing severe 2-vessel CAD with CTO of mid-LAD and mid-RCA with collaterals present and patent LAD stent with medical management recommended given his ongoing rectal bleeding. - Continue ASA, Zetia and Crestor with plans to switch beta-blocker therapy to Coreg given his cocaine  use. Can also restart Imdur as discussed above.  3. HFpEF - He was treated for an acute CHF exacerbation during his prior admission with echocardiogram at that time showing a preserved EF of 55 to 60% and no regional wall motion abnormalities. He does not appear volume overloaded by examination today. - Can continue PO Lasix 40 mg twice daily at the time of admission.  4. Rectal Bleeding/Anemia - This has been a recurrent issue for the patient and he previously underwent hemorrhoidectomy with repeat ligation on 10/31/2022. Management per the admitting team. Of note, his hemoglobin has improved from 9.5 on 11/07/2022 to 11.6 today.   5. Cocaine Use - Reports using last week. UDS pending. Cessation advised.   6. HLD - He was switched to Crestor '40mg'$  daily last month. Can recheck FLP as an outpatient once on this for 6-8 weeks.    For questions or updates, please contact [place] Please consult [email or web address] for contact info under    Signed, Erma Heritage, PA-C  11/19/2022 10:23 AM   Attending note   Patient seen and discussed with PA Ahmed Prima, I agree with her documentation. 51 yo male hsitory of CAD with prior PCI to LAD in 2012, cocaine abuse, recent rectal/hemnorroidal bleeeding, prior SMA sten, HFpEF admitted with chest pain   Recent admit 10/2022 with NSTEMI. Cath showed patent stent LAD, ramus 85%, LCX CTO with collaterals, RCA prox 80% and mid CTO with collaterals to RPDA. Managed medically given ongoing issues with rectal bleeding. From note could consider PCI to ramus and possibly RCA. LVEDP was 29. 10/2022 echo LVEF 55-60%, no WMAs,    Presents with chest pain. On my history reports pain started 2AM last night and has been constant. Pressure like pain right and left chest radiates down arms and into back. Worst with deep breathing, some chest wall tenderness to palpation.    WBC 8.2 Hgb 11.6 Plt 161 K 3.5 Cr 0.55 Trop 10-->9 EKG SR, no specific ischemic  changes CXR: no acute process RUQ [place]: no acute process     1.CAD/Chest pain - recent admit 10/2022 with NSTEMI. Cath with RCA and LCX CTO, raums 85%. Managed medically given ongoign issues with rectal bleeding, from notes could consider PCI to ramus and RCA over time if bleeding stable and ongoing angina - presents with chest pain, no objective evidence of ischemia by EKG or enzymes - history of cocaine use, UDS pending - chest pain  is very atypical on my history.   - would add imdur '15mg'$  daily (recommended during prior admission but not on d/c summary). Can continue beta blocker (can change from lopressor to coreg 3.'125mg'$  bid due to ongoing cocaine use). No plans for repeat cardiac testing at this time, worth trying titration of antianginals though not entirely clear this is angina. Perhaps more cocaine related chest pain, f/u UDS   2. Rectal bleeding - hemorrhoidal procedure on 10/28/2022, and on 10/31/22.  - reports some ongoing bleeding, though Hgb improved to 11.6 this admit (9.5 at discharge in 10/2022)     Carlyle Dolly MD

## 2022-11-20 DIAGNOSIS — I25118 Atherosclerotic heart disease of native coronary artery with other forms of angina pectoris: Secondary | ICD-10-CM | POA: Diagnosis not present

## 2022-11-20 DIAGNOSIS — R079 Chest pain, unspecified: Secondary | ICD-10-CM | POA: Diagnosis not present

## 2022-11-20 DIAGNOSIS — F1410 Cocaine abuse, uncomplicated: Secondary | ICD-10-CM | POA: Diagnosis not present

## 2022-11-20 DIAGNOSIS — K551 Chronic vascular disorders of intestine: Secondary | ICD-10-CM

## 2022-11-20 DIAGNOSIS — F1029 Alcohol dependence with unspecified alcohol-induced disorder: Secondary | ICD-10-CM

## 2022-11-20 DIAGNOSIS — E785 Hyperlipidemia, unspecified: Secondary | ICD-10-CM

## 2022-11-20 DIAGNOSIS — R0789 Other chest pain: Secondary | ICD-10-CM | POA: Diagnosis not present

## 2022-11-20 DIAGNOSIS — I739 Peripheral vascular disease, unspecified: Secondary | ICD-10-CM

## 2022-11-20 DIAGNOSIS — F1910 Other psychoactive substance abuse, uncomplicated: Secondary | ICD-10-CM

## 2022-11-20 DIAGNOSIS — I10 Essential (primary) hypertension: Secondary | ICD-10-CM

## 2022-11-20 LAB — COMPREHENSIVE METABOLIC PANEL
ALT: 22 U/L (ref 0–44)
AST: 41 U/L (ref 15–41)
Albumin: 3.2 g/dL — ABNORMAL LOW (ref 3.5–5.0)
Alkaline Phosphatase: 222 U/L — ABNORMAL HIGH (ref 38–126)
Anion gap: 9 (ref 5–15)
BUN: <5 mg/dL — ABNORMAL LOW (ref 6–20)
CO2: 23 mmol/L (ref 22–32)
Calcium: 8.3 mg/dL — ABNORMAL LOW (ref 8.9–10.3)
Chloride: 101 mmol/L (ref 98–111)
Creatinine, Ser: 0.74 mg/dL (ref 0.61–1.24)
GFR, Estimated: >60 mL/min (ref >60)
Glucose, Bld: 103 mg/dL — ABNORMAL HIGH (ref 70–99)
Potassium: 3.4 mmol/L — ABNORMAL LOW (ref 3.5–5.1)
Sodium: 133 mmol/L — ABNORMAL LOW (ref 135–145)
Total Bilirubin: 2.3 mg/dL — ABNORMAL HIGH (ref 0.3–1.2)
Total Protein: 7.2 g/dL (ref 6.5–8.1)

## 2022-11-20 LAB — CBC
HCT: 31.3 % — ABNORMAL LOW (ref 39.0–52.0)
Hemoglobin: 9.9 g/dL — ABNORMAL LOW (ref 13.0–17.0)
MCH: 30.8 pg (ref 26.0–34.0)
MCHC: 31.6 g/dL (ref 30.0–36.0)
MCV: 97.5 fL (ref 80.0–100.0)
Platelets: 127 10*3/uL — ABNORMAL LOW (ref 150–400)
RBC: 3.21 MIL/uL — ABNORMAL LOW (ref 4.22–5.81)
RDW: 19.2 % — ABNORMAL HIGH (ref 11.5–15.5)
WBC: 6.4 10*3/uL (ref 4.0–10.5)
nRBC: 0 % (ref 0.0–0.2)

## 2022-11-20 LAB — MAGNESIUM: Magnesium: 1.5 mg/dL — ABNORMAL LOW (ref 1.7–2.4)

## 2022-11-20 MED ORDER — FUROSEMIDE 40 MG PO TABS
40.0000 mg | ORAL_TABLET | Freq: Every day | ORAL | Status: DC
  Administered 2022-11-21 – 2022-11-23 (×3): 40 mg via ORAL
  Filled 2022-11-20 (×3): qty 1

## 2022-11-20 MED ORDER — MAGNESIUM SULFATE 2 GM/50ML IV SOLN
2.0000 g | Freq: Once | INTRAVENOUS | Status: AC
  Administered 2022-11-20: 2 g via INTRAVENOUS
  Filled 2022-11-20: qty 50

## 2022-11-20 MED ORDER — POTASSIUM CHLORIDE CRYS ER 20 MEQ PO TBCR
40.0000 meq | EXTENDED_RELEASE_TABLET | Freq: Once | ORAL | Status: AC
  Administered 2022-11-20: 40 meq via ORAL
  Filled 2022-11-20: qty 2

## 2022-11-20 MED ORDER — MORPHINE SULFATE (PF) 2 MG/ML IV SOLN
1.0000 mg | INTRAVENOUS | Status: DC | PRN
  Administered 2022-11-20 – 2022-11-22 (×8): 1 mg via INTRAVENOUS
  Filled 2022-11-20 (×8): qty 1

## 2022-11-20 MED ORDER — PANTOPRAZOLE SODIUM 40 MG PO TBEC
40.0000 mg | DELAYED_RELEASE_TABLET | Freq: Two times a day (BID) | ORAL | Status: DC
  Administered 2022-11-20 – 2022-11-24 (×9): 40 mg via ORAL
  Filled 2022-11-20 (×9): qty 1

## 2022-11-20 NOTE — Progress Notes (Signed)
Progress Note  Patient Name: Ethan Wells Date of Encounter: 11/20/2022  Primary Cardiologist: Lauree Chandler, MD  Subjective   No acute events overnight.  Patient continues to complain of persistent chest pain which never resolved.  Associated with SOB.  Inpatient Medications    Scheduled Meds:  aspirin  81 mg Oral Daily   ezetimibe  10 mg Oral Daily   ferrous sulfate  325 mg Oral Q breakfast   folic acid  1 mg Oral Daily   folic acid  1 mg Oral Daily   furosemide  40 mg Oral BID   gabapentin  300 mg Oral TID   isosorbide mononitrate  15 mg Oral Daily   multivitamin with minerals  1 tablet Oral Daily   nicotine  14 mg Transdermal Daily   pantoprazole  40 mg Oral Daily   polyethylene glycol  17 g Oral Daily   rosuvastatin  40 mg Oral Daily   sodium chloride flush  3 mL Intravenous Q12H   thiamine  100 mg Oral Daily   Or   thiamine  100 mg Intravenous Daily   Continuous Infusions:  sodium chloride     PRN Meds: sodium chloride, acetaminophen **OR** acetaminophen, LORazepam **OR** LORazepam, morphine injection, nitroGLYCERIN, ondansetron **OR** ondansetron (ZOFRAN) IV, sodium chloride flush   Vital Signs    Vitals:   11/20/22 0522 11/20/22 0833 11/20/22 1043 11/20/22 1245  BP: (!) 87/61 (!) 94/46 (!) 94/53 (!) 106/59  Pulse: (!) 107 (!) 103 100 99  Resp: '19 19 19 18  '$ Temp: 99.1 F (37.3 C) 99.2 F (37.3 C)  98.8 F (37.1 C)  TempSrc: Oral Oral    SpO2: 98% 99% 98% 97%  Weight:      Height:        Intake/Output Summary (Last 24 hours) at 11/20/2022 1448 Last data filed at 11/19/2022 2215 Gross per 24 hour  Intake 240 ml  Output 1100 ml  Net -860 ml   Filed Weights   11/19/22 0624  Weight: 90.8 kg    Telemetry    Personally reviewed.  Sinus tachycardia cardia, heart rate 1 10-1 20s.  ECG    Sinus tachycardia  Physical Exam   GEN: No acute distress.   Neck: No JVD. Cardiac: RRR, no murmur, rub, or gallop.  Respiratory:  Nonlabored. Clear to auscultation bilaterally. GI: Soft, nontender, bowel sounds present. MS: No edema; No deformity. Neuro:  Nonfocal. Psych: Alert and oriented x 3. Normal affect.  Labs    Chemistry Recent Labs  Lab 11/19/22 0716 11/20/22 0451  NA 137 133*  K 3.5 3.4*  CL 104 101  CO2 20* 23  GLUCOSE 111* 103*  BUN <5* <5*  CREATININE 0.55* 0.74  CALCIUM 9.2 8.3*  PROT 7.9 7.2  ALBUMIN 3.4* 3.2*  AST 46* 41  ALT 29 22  ALKPHOS 229* 222*  BILITOT 1.5* 2.3*  GFRNONAA >60 >60  ANIONGAP 13 9     Hematology Recent Labs  Lab 11/19/22 0716 11/20/22 0451  WBC 8.2 6.4  RBC 3.62* 3.21*  HGB 11.6* 9.9*  HCT 34.8* 31.3*  MCV 96.1 97.5  MCH 32.0 30.8  MCHC 33.3 31.6  RDW 19.5* 19.2*  PLT 161 127*    Cardiac Enzymes Recent Labs  Lab 10/29/22 1204 10/29/22 1433 10/29/22 1636 11/19/22 0716 11/19/22 0837  TROPONINIHS 241* 1,607* 1,418* 10 9    BNPNo results for input(s): "BNP", "PROBNP" in the last 168 hours.   DDimerNo results for input(s): "DDIMER" in  the last 168 hours.   Radiology    US Abdomen Limited RUQ (LIVER/GB)  Result Date: 11/19/2022 CLINICAL DATA:  Abdominal pain EXAM: ULTRASOUND ABDOMEN LIMITED RIGHT UPPER QUADRANT COMPARISON:  None Available. FINDINGS: Gallbladder: No gallstones or wall thickening visualized. No sonographic Murphy sign noted by sonographer. Common bile duct: Diameter: 0.5 cm, which is normal Liver: No focal lesion identified. Increased parenchymal echogenicity, which can with hepatic steatosis. Portal vein is patent on color Doppler imaging with normal direction of blood flow towards the liver. Other: None. IMPRESSION: No sonographic finding to suggest cholecystitis. Electronically Signed   By: Marin Roberts M.D.   On: 11/19/2022 10:32   DG Chest Portable 1 View  Result Date: 11/19/2022 CLINICAL DATA:  Chest pain EXAM: PORTABLE CHEST 1 VIEW COMPARISON:  10/29/2022 FINDINGS: 0737 hours. The lungs are clear without focal pneumonia,  edema, pneumothorax or pleural effusion. The cardio pericardial silhouette is enlarged. The visualized bony structures of the thorax are unremarkable. Telemetry leads overlie the chest. IMPRESSION: No active disease. Electronically Signed   By: Misty Stanley M.D.   On: 11/19/2022 07:56    Cardiac Studies   Echo from 10/30/2022 LVEF 55 to 32% RV systolic function is normal No valve abnormalities   Assessment & Plan    Patient is a 51 year old M known to have CAD s/p LAD PCI in 2012, NSTEMI in 11/23 with cath showing severe two-vessel CAD (CTO mid LAD and CTO of mid RCA with patent LAD stent on medical management given his ongoing rectal bleeding), PAD s/p SMA stenting, HTN, HLD, cocaine use, rectal bleeding s/p hemorrhoidectomy with repeat ligation on 10/31/2022 presented to the hospital with chest pain.  # Cocaine induced chest pain -Patient has persistent chest pain for 2 days prior to presentation. Imdur partially relieved the pain for some time only. UDS positive for cocaine. Continue benzodiazepines for cocaine induced chest pain. -Patient had LHC in 10/2022 showing CTO of mid LAD and CTO of mid RCA. Medical management was recommended. High- sensitivity troponins 10, 9.  EKG with no evidence of ischemia. No indication of LHC.  #CAD s/p LAD PCI in 2012, NSTEMI in 11/23 with cath showing severe two-vessel CAD (CTO mid LAD and CTO of mid RCA with patent LAD stent on medical management given his ongoing rectal bleeding) #PAD s/p SMA stenting -Continue aspirin 81 mg once daily -Continue rosuvastatin 40 mg nightly and Zetia 10 mg once daily  I have spent a total of 33 minutes with patient reviewing chart , telemetry, EKGs, labs and examining patient as well as establishing an assessment and plan that was discussed with the patient.  > 50% of time was spent in direct patient care.    Signed, Chalmers Guest, MD  11/20/2022, 2:48 PM

## 2022-11-20 NOTE — Progress Notes (Signed)
   11/20/22 0833  Vitals  Temp 99.2 F (37.3 C)  Temp Source Oral  BP (!) 94/46  MAP (mmHg) (!) 62  BP Location Left Arm  BP Method Automatic  Patient Position (if appropriate) Lying  Pulse Rate (!) 103  Pulse Rate Source Dinamap  Resp 19  Level of Consciousness  Level of Consciousness Alert  MEWS COLOR  MEWS Score Color Yellow  Oxygen Therapy  SpO2 99 %  O2 Device Room Air  MEWS Score  MEWS Temp 0  MEWS Systolic 1  MEWS Pulse 1  MEWS RR 0  MEWS LOC 0  MEWS Score 2  Provider Notification  Provider Name/Title Dr Dyann Kief  Date Provider Notified 11/20/22  Time Provider Notified [phone]  Method of Notification Page  Notification Reason Other (Comment) (b/p 94/46)  Provider response No new orders

## 2022-11-20 NOTE — Progress Notes (Signed)
Pt receiving IV morphine to control episodes of chest/back pain.  SHOB reported with exertion, none at rest, on room air.  Pt NSR/ST on monitor.  Ativan x 1 for elevated CIWA.  Pt having ongoing bright red rectal bleeding, reports this has been present since recent hemorrhoidectomy, sometimes worse than others.  Denies nausea.

## 2022-11-20 NOTE — TOC Initial Note (Signed)
Transition of Care [facility]) - Initial/Assessment Note    Patient Details  Name: Ethan Wells MRN: [id number] Date of Birth: Sep 16, 1971  [facility]) CM/SW Contact:    Ihor Gully, LCSW Phone Number: 11/20/2022, 12:37 PM  Clinical Narrative:                 Patent admitted for chest pain. TOC consulted for SA resources. Patient indicates that he has resources for SA and knows how to access treatment. He has been inpatient treatment for SA in the past and states that he plans on contacting that facility for treatment. Patient is considered high risk for readmission.   Expected Discharge Plan: Home/Self Care Barriers to Discharge: No Barriers Identified   Patient Goals and CMS Choice Patient states their goals for this hospitalization and ongoing recovery are:: return home      Expected Discharge Plan and Services Expected Discharge Plan: Home/Self Care       Living arrangements for the past 2 months: Mobile Home                                      Prior Living Arrangements/Services Living arrangements for the past 2 months: Mobile Home Lives with:: Self Patient language and need for interpreter reviewed:: Yes Do you feel safe going back to the place where you live?: Yes      Need for Family Participation in Patient Care: Yes (Comment) Care giver support system in place?: Yes (comment) Current home services: DME Criminal Activity/Legal Involvement Pertinent to Current Situation/Hospitalization: No - Comment as needed  Activities of Daily Living Home Assistive Devices/Equipment: Bedside commode/3-in-1 ADL Screening (condition at time of admission) Patient's cognitive ability adequate to safely complete daily activities?: Yes Is the patient deaf or have difficulty hearing?: No Does the patient have difficulty seeing, even when wearing glasses/contacts?: No Does the patient have difficulty concentrating, remembering, or making decisions?:  No Patient able to express need for assistance with ADLs?: Yes Does the patient have difficulty dressing or bathing?: No Independently performs ADLs?: Yes (appropriate for developmental age) Does the patient have difficulty walking or climbing stairs?: No Weakness of Legs: Both Weakness of Arms/Hands: None  Permission Sought/Granted                  Emotional Assessment       Orientation: : Oriented to Self, Oriented to Place, Oriented to  Time, Oriented to Situation Alcohol / Substance Use: Alcohol Use, Illicit Drugs Psych Involvement: No (comment)  Admission diagnosis:  Chest pain [R07.9] Chest pain, unspecified type [R07.9] Patient Active Problem List   Diagnosis Date Noted   Chest pain 11/19/2022   Demand ischemia 11/05/2022   Acute on chronic heart failure with preserved ejection fraction (HFpEF) ([place]) 10/29/2022   Acute respiratory distress 10/29/2022   Elevated rheumatoid factor 10/28/2022   Hyperkalemia 10/25/2022   Hemorrhoids, internal, with bleeding 10/25/2022   Mesenteric artery stenosis ([place]) 10/23/2022   Iron deficiency anemia 10/23/2022   Hematochezia    Abnormal CT scan, colon    Colitis 09/30/2022   Prolonged QT interval 09/30/2022   Alcohol use disorder 08/19/2022   Blood loss anemia 05/29/2022   GI bleed 05/28/2022   Non-intractable vomiting    Epigastric pain    Polyp of ascending colon    ABLA (acute blood loss anemia) 08/07/2021   Acute gastritis    Hyperbilirubinemia  Elevated troponin    Notalgia    Non-ST elevation (NSTEMI) myocardial infarction ([place]) 12/19/2020   History of ST elevation myocardial infarction (STEMI) 12/19/2020   Alcohol dependence ([place]) 12/19/2020   Depression with anxiety 10/05/2020   Esophageal reflux 10/05/2020   Headache 10/05/2020   Hyperlipidemia 10/05/2020   Hyponatremia 06/04/2020   Hypomagnesemia 82/50/5397   Chronic systolic CHF (congestive heart failure) ([place]) 06/04/2020   Leukocytosis 06/04/2020    Abdominal pain 06/04/2020   Hypokalemia 06/04/2020   Ischemic cardiomyopathy    Polysubstance abuse ([place])    NSTEMI (non-ST elevated myocardial infarction) ([place]) 07/12/2019   Abdominal pain, RUQ 05/25/2018   Chest pain, mid sternal 11/17/2012   Cocaine abuse ([place])    Marijuana abuse    TOBACCO ABUSE 02/26/2011   Essential hypertension 02/26/2011   CAD (coronary artery disease) 02/26/2011   Other specified forms of chronic ischemic heart disease 02/26/2011   PCP:  Nickola Major, NP Pharmacy:   [id number] [place] [place] [id number]: [phone]: [phone] [facility] [address]Phone: [phone]: [phone]    Social Determinants of Health (SDOH) Interventions Food Insecurity Interventions: Other (Comment) Utilities Interventions: Intervention Not Indicated  Readmission Risk Interventions    10/22/2022   10:47 AM  Readmission Risk Prevention Plan  Transportation Screening Complete  PCP or Specialist Appt within 3-5 Days Complete  Social Work Consult for [place]Counseling Complete  Palliative Care Screening Not Applicable  Medication Review (Press photographer Complete

## 2022-11-20 NOTE — Progress Notes (Signed)
Progress Note   [name] DOB: 02-13-1971 DOA: 11/19/2022     1 DOS: the patient was seen and examined on 11/20/2022   Brief hospital course: As per H&P written by Dr. Manuella Ghazi on 11/19/2022 Ethan Wells is a 51 y.o. male with medical history significant for CAD with prior PCI and recent NSTEMI 10/2022 with severe two-vessel CAD and stent avoided due to ongoing rectal bleed, PAD status post SMA stenting, dyslipidemia, hypertension, cocaine use, alcohol abuse, chronic anemia, and rectal bleeding status post hemorrhoidectomy with repeat ligation on 10/31/2022 who presented to the ED with complaints of chest pain over the last 2 days.  He was recently hospitalized at [facility] and had NSTEMI and cardiac catheterization with severe occlusions, but only medical therapy was recommended given his ongoing bleeding issues.  He was also noted to have rectal bleeding and he underwent repeat ligation of hemorrhoids as well.  He was also treated for acute CHF exacerbation at that time.   He states that he has not only had chest pain over the last 2 days, but some recurrent rectal bleeding and recent cocaine use.  He states that he has had ongoing pain ever since his recent hospitalization and his chest as well as into his back.  He denies any shortness of breath, orthopnea, dyspnea on exertion, or pitting edema.  He states that he has been compliant with his medications and has been living in a camper at his friend's house and states that he has not had much heat.  He states that he drinks 5-6 alcoholic drinks on a daily basis that appears to be hard liquor based mixed with iced tea.   ED Course: Vital signs have been stable in the ED and hemoglobin 11.6 with prior 9.5.  Troponin is within normal limits and UDS is pending.  EKG shows some sinus tachycardia with no other acute abnormalities and right upper quadrant ultrasound with no findings to suggest cholecystitis.  He is currently  asking for some pain medications.  Assessment and Plan: 1-chest pain -Patient with recent NSTEMI with cardiac cath in November 2023 -Negative troponin and no acute ischemic changes on telemetry or EKG -Patient continues to have chest discomfort most likely cocaine induced. -Continue treatment with aspirin, Crestor, Zetia, carvedilol and Imdur -Follow clinical response. -Cocaine cessation counseling provided.  2-coronary artery disease with recent cardiac catheterization -Continue medical management -Continue patient follow-up with cardiology service -No plan for further heart cath or catheterization currently -Continue aspirin, statin, Zetia and the use of carvedilol and Imdur.  3-chronic diastolic heart failure -Compensated -Recent echocardiogram with preserved ejection fraction 55-60% -Heart healthy/low-sodium diet discussed with patient -Continue adjusted dose of Lasix. -Continue to follow daily weights.  4-cocaine abuse -UDS positive -Cessation counseling provided -Avoiding metoprolol as previously prescribed in the setting of ongoing cocaine usage -Per cardiology okay to use carvedilol. -Continue CIWA protocol  5-hyperlipidemia/dyslipidemia -Continue Crestor consistently  6-tobacco abuse -Cessation counseling provided -Continue nicotine patch.  7-alcohol abuse -No frank withdrawal currently appreciated -Continue thiamine and folic acid -Continue CIWA protocol and as needed use of Ativan.  8-mesenteric artery stenosis  -Status post stent to SMA October 24, 2022 -Continue aspirin, statin secondary factor modification -Continue the use of PPI.  9-class I obesity -Body mass index is 33.31 kg/m. -Low calorie diet, portion control and increase physical activity discussed with patient.  10-hyponatremia, hypokalemia and hypomagnesemia -Most likely associated with ongoing history of alcohol abuse and mild dehydration -Continue to replete electrolyte as  needed -Follow electrolytes trend.  Subjective:  Complaining of ongoing atypical chest discomfort; no shortness of breath, no ischemic changes on telemetry and no fever.  Patient denies palpitations.  Physical Exam: Vitals:   11/20/22 0522 11/20/22 0833 11/20/22 1043 11/20/22 1245  BP: (!) 87/61 (!) 94/46 (!) 94/53 (!) 106/59  Pulse: (!) 107 (!) 103 100 99  Resp: '19 19 19 18  '$ Temp: 99.1 F (37.3 C) 99.2 F (37.3 C)  98.8 F (37.1 C)  TempSrc: Oral Oral    SpO2: 98% 99% 98% 97%  Weight:      Height:       General exam: Alert, awake, oriented x 3; restless; having diffuse florid complaints. Respiratory system: Clear to auscultation. Respiratory effort normal.  Good saturation on room air. Cardiovascular system:RRR. No rubs or gallops; no JVD. Gastrointestinal system: Abdomen is nondistended, soft and nontender. No organomegaly or masses felt. Normal bowel sounds heard. Central nervous system: Alert and oriented. No focal neurological deficits. Extremities: No cyanosis, clubbing or edema. Skin: No petechiae. Psychiatry: Judgement and insight appear normal. Mood & affect appropriate.   Data Reviewed: Magnesium 1.5 Comprehensive metabolic panel: Sodium 631, potassium 3.4, chloride 101, bicarb 23, BUN less than 5, creatinine 0.74; alkaline phosphatase 222, normal AST and ALT. CBC: WBC 6.4, hemoglobin 9.9 and platelets count 127 K  Family Communication: No family at bedside.  Disposition: Status is: Inpatient Remains inpatient appropriate because: Continue treatment of ongoing chest pain; and management of alcohol/cocaine abuse.   Planned Discharge Destination: Home  Time spent: 35 minutes  Author: Barton Dubois, MD 11/20/2022 5:15 PM  For on call review [email or web address].

## 2022-11-20 NOTE — Progress Notes (Signed)
Notified provider of pt BP with request for possible intervention, provider advised no interventions needed at this time.

## 2022-11-21 DIAGNOSIS — F1029 Alcohol dependence with unspecified alcohol-induced disorder: Secondary | ICD-10-CM | POA: Diagnosis not present

## 2022-11-21 DIAGNOSIS — F1410 Cocaine abuse, uncomplicated: Secondary | ICD-10-CM | POA: Diagnosis not present

## 2022-11-21 DIAGNOSIS — Z720 Tobacco use: Secondary | ICD-10-CM

## 2022-11-21 DIAGNOSIS — R079 Chest pain, unspecified: Secondary | ICD-10-CM | POA: Diagnosis not present

## 2022-11-21 DIAGNOSIS — E785 Hyperlipidemia, unspecified: Secondary | ICD-10-CM | POA: Diagnosis not present

## 2022-11-21 LAB — CBC
HCT: 27.6 % — ABNORMAL LOW (ref 39.0–52.0)
Hemoglobin: 8.9 g/dL — ABNORMAL LOW (ref 13.0–17.0)
MCH: 32.2 pg (ref 26.0–34.0)
MCHC: 32.2 g/dL (ref 30.0–36.0)
MCV: 100 fL (ref 80.0–100.0)
Platelets: 103 10*3/uL — ABNORMAL LOW (ref 150–400)
RBC: 2.76 MIL/uL — ABNORMAL LOW (ref 4.22–5.81)
RDW: 18.5 % — ABNORMAL HIGH (ref 11.5–15.5)
WBC: 4.3 10*3/uL (ref 4.0–10.5)
nRBC: 0 % (ref 0.0–0.2)

## 2022-11-21 LAB — BASIC METABOLIC PANEL
Anion gap: 8 (ref 5–15)
BUN: 5 mg/dL — ABNORMAL LOW (ref 6–20)
CO2: 23 mmol/L (ref 22–32)
Calcium: 8.1 mg/dL — ABNORMAL LOW (ref 8.9–10.3)
Chloride: 105 mmol/L (ref 98–111)
Creatinine, Ser: 0.74 mg/dL (ref 0.61–1.24)
GFR, Estimated: >60 mL/min (ref >60)
Glucose, Bld: 137 mg/dL — ABNORMAL HIGH (ref 70–99)
Potassium: 3.7 mmol/L (ref 3.5–5.1)
Sodium: 136 mmol/L (ref 135–145)

## 2022-11-21 LAB — MAGNESIUM: Magnesium: 2 mg/dL (ref 1.7–2.4)

## 2022-11-21 MED ORDER — PANTOPRAZOLE SODIUM 40 MG PO TBEC: 40.0000 mg | DELAYED_RELEASE_TABLET | Freq: Two times a day (BID) | ORAL | 2 refills | Status: DC

## 2022-11-21 MED ORDER — ISOSORBIDE MONONITRATE ER 30 MG PO TB24: 15.0000 mg | ORAL_TABLET | Freq: Every day | ORAL | 2 refills | Status: DC

## 2022-11-21 MED ORDER — FUROSEMIDE 40 MG PO TABS: 40.0000 mg | ORAL_TABLET | Freq: Every day | ORAL | 2 refills | Status: DC

## 2022-11-21 MED ORDER — NICOTINE 14 MG/24HR TD PT24: 14.0000 mg | MEDICATED_PATCH | Freq: Every day | TRANSDERMAL | 0 refills | Status: DC

## 2022-11-21 MED ORDER — CHLORDIAZEPOXIDE HCL 5 MG PO CAPS: ORAL_CAPSULE | ORAL | 0 refills | Status: DC

## 2022-11-21 NOTE — TOC Progression Note (Signed)
Transition of Care [facility]) - Progression Note    Patient Details  Name: Ethan Wells MRN: [id number] Date of Birth: September 15, 1971  [facility]) CM/SW Contact  Ihor Gully, LCSW Phone Number: 11/21/2022, 11:08 AM  Clinical Narrative:    Patient offered shelter listing. Patient declined and indicated that he was not homeless. He indicates that she lives in a camper.    Expected Discharge Plan: Home/Self Care Barriers to Discharge: No Barriers Identified  Expected Discharge Plan and Services Expected Discharge Plan: Home/Self Care       Living arrangements for the past 2 months: Mobile Home Expected Discharge Date: 11/21/22                                     Social Determinants of Health (SDOH) Interventions Food Insecurity Interventions: Other (Comment) Utilities Interventions: Intervention Not Indicated  Readmission Risk Interventions    10/22/2022   10:47 AM  Readmission Risk Prevention Plan  Transportation Screening Complete  PCP or Specialist Appt within 3-5 Days Complete  Social Work Consult for [place] Planning/Counseling Complete  Palliative Care Screening Not Applicable  Medication Review Press photographer) Complete

## 2022-11-21 NOTE — Care Management Important Message (Signed)
Important Message  Patient Details  Name: Ethan Wells MRN: [id number] Date of Birth: [birth year]   Medicare Important Message Given:  N/A - LOS <3 / Initial given by admissions     Tommy Medal 11/21/2022, 11:40 AM

## 2022-11-21 NOTE — TOC Progression Note (Signed)
Transition of Care [facility]) - Progression Note    Patient Details  Name: Ethan Wells MRN: [id number] Date of Birth: 1971-07-29  [facility]) CM/SW Contact  Ihor Gully, LCSW Phone Number: 11/21/2022, 1:11 PM  Clinical Narrative:    Patient advises that wants to stay a few more days because he does not feel well.  He indicated that if he appealed his d/c he would have three additional days because this is what happened last time he appealed. Requested his IM with appeal information due to him losing his other copy. TOC provided patient with IM.    Expected Discharge Plan: Home/Self Care Barriers to Discharge: No Barriers Identified  Expected Discharge Plan and Services Expected Discharge Plan: Home/Self Care       Living arrangements for the past 2 months: Mobile Home Expected Discharge Date: 11/21/22                                     Social Determinants of Health (SDOH) Interventions Food Insecurity Interventions: Other (Comment) Utilities Interventions: Intervention Not Indicated  Readmission Risk Interventions    10/22/2022   10:47 AM  Readmission Risk Prevention Plan  Transportation Screening Complete  PCP or Specialist Appt within 3-5 Days Complete  Social Work Consult for [place] Planning/Counseling Complete  Palliative Care Screening Not Applicable  Medication Review Press photographer) Complete

## 2022-11-21 NOTE — Progress Notes (Signed)
Rounding Note    Patient Name: Ethan Wells Date of Encounter: 11/21/2022  [place] Cardiologist: Lauree Chandler, MD   Subjective   Ongoing chest pains overnight.   Inpatient Medications    Scheduled Meds:  aspirin  81 mg Oral Daily   ezetimibe  10 mg Oral Daily   ferrous sulfate  325 mg Oral Q breakfast   folic acid  1 mg Oral Daily   folic acid  1 mg Oral Daily   furosemide  40 mg Oral Daily   gabapentin  300 mg Oral TID   isosorbide mononitrate  15 mg Oral Daily   multivitamin with minerals  1 tablet Oral Daily   nicotine  14 mg Transdermal Daily   pantoprazole  40 mg Oral BID AC   polyethylene glycol  17 g Oral Daily   rosuvastatin  40 mg Oral Daily   sodium chloride flush  3 mL Intravenous Q12H   thiamine  100 mg Oral Daily   Or   thiamine  100 mg Intravenous Daily   Continuous Infusions:  sodium chloride     PRN Meds: sodium chloride, acetaminophen **OR** acetaminophen, LORazepam **OR** LORazepam, morphine injection, nitroGLYCERIN, ondansetron **OR** ondansetron (ZOFRAN) IV, sodium chloride flush   Vital Signs    Vitals:   11/20/22 1043 11/20/22 1245 11/20/22 2134 11/21/22 0443  BP: (!) 94/53 (!) 106/59 (!) 101/46 (!) 99/57  Pulse: 100 99 95 89  Resp: '19 18 19 19  '$ Temp:  98.8 F (37.1 C) 98 F (36.7 C) 98.1 F (36.7 C)  TempSrc:    Oral  SpO2: 98% 97% 99% 97%  Weight:      Height:        Intake/Output Summary (Last 24 hours) at 11/21/2022 0913 Last data filed at 11/20/2022 1804 Gross per 24 hour  Intake 530 ml  Output 4 ml  Net 526 ml      11/19/2022    6:24 AM 11/15/2022    2:39 PM 11/08/2022    6:04 AM  Last 3 Weights  Weight (lbs) 200 lb 2.8 oz 200 lb 203 lb 8 oz  Weight (kg) 90.8 kg 90.719 kg 92.307 kg      Telemetry    NSR - Personally Reviewed  ECG    N/a - Personally Reviewed  Physical Exam   GEN: No acute distress.   Neck: No JVD Cardiac: RRR, no murmurs, rubs, or gallops.  Respiratory: Clear  to auscultation bilaterally. GI: Soft, nontender, non-distended  MS: No edema; No deformity. Neuro:  Nonfocal  Psych: Normal affect   Labs    High Sensitivity Troponin:   Recent Labs  Lab 10/29/22 1204 10/29/22 1433 10/29/22 1636 11/19/22 0716 11/19/22 0837  TROPONINIHS 241* 1,607* 1,418* 10 9     Chemistry Recent Labs  Lab 11/19/22 0716 11/20/22 0451 11/21/22 0544  NA 137 133* 136  K 3.5 3.4* 3.7  CL 104 101 105  CO2 20* 23 23  GLUCOSE 111* 103* 137*  BUN <5* <5* 5*  CREATININE 0.55* 0.74 0.74  CALCIUM 9.2 8.3* 8.1*  MG  --  1.5* 2.0  PROT 7.9 7.2  --   ALBUMIN 3.4* 3.2*  --   AST 46* 41  --   ALT 29 22  --   ALKPHOS 229* 222*  --   BILITOT 1.5* 2.3*  --   GFRNONAA >60 >60 >60  ANIONGAP '13 9 8    '$ Lipids No results for input(s): "CHOL", "TRIG", "HDL", "LABVLDL", "  [place]", "CHOLHDL" in the last 168 hours.  Hematology Recent Labs  Lab 11/19/22 0716 11/20/22 0451 11/21/22 0544  WBC 8.2 6.4 4.3  RBC 3.62* 3.21* 2.76*  HGB 11.6* 9.9* 8.9*  HCT 34.8* 31.3* 27.6*  MCV 96.1 97.5 100.0  MCH 32.0 30.8 32.2  MCHC 33.3 31.6 32.2  RDW 19.5* 19.2* 18.5*  PLT 161 127* 103*   Thyroid No results for input(s): "TSH", "FREET4" in the last 168 hours.  BNPNo results for input(s): "BNP", "PROBNP" in the last 168 hours.  DDimer No results for input(s): "DDIMER" in the last 168 hours.   Radiology    US Abdomen Limited RUQ (LIVER/GB)  Result Date: 11/19/2022 CLINICAL DATA:  Abdominal pain EXAM: ULTRASOUND ABDOMEN LIMITED RIGHT UPPER QUADRANT COMPARISON:  None Available. FINDINGS: Gallbladder: No gallstones or wall thickening visualized. No sonographic Murphy sign noted by sonographer. Common bile duct: Diameter: 0.5 cm, which is normal Liver: No focal lesion identified. Increased parenchymal echogenicity, which can with hepatic steatosis. Portal vein is patent on color Doppler imaging with normal direction of blood flow towards the liver. Other: None. IMPRESSION: No  sonographic finding to suggest cholecystitis. Electronically Signed   By: Marin Roberts M.D.   On: 11/19/2022 10:32    Cardiac Studies     Patient Profile     Ethan Wells is a 51 y.o. male with a hx of w/PMH of CAD (s/p PCI to LAD in 2012, NSTEMI in 10/2022 with cath showing severe 2-vessel CAD with CTO of mid-LAD and mid-RCA with collaterals present and patent LAD stent with medical management recommended given his ongoing rectal bleeding), PAD (s/p SMA stenting), HTN, HLD, cocaine use, alcohol abuse, anemia and rectal bleeding (s/p hemorrhoidectomy with repeat ligation on 10/31/2022) who is being seen 11/19/2022 for the evaluation of chest pain at the request of Dr. Alvino Chapel.  Assessment & Plan    1.CAD/Chest pain - recent admit 10/2022 with NSTEMI. Cath with RCA and LCX CTO, raums 85%. Managed medically given ongoign issues with rectal bleeding, from notes could consider PCI to ramus and RCA over time if bleeding stable and ongoing angina - presents with chest pain, no objective evidence of ischemia by EKG or enzymes - history of cocaine use, UDS + this admission - chest pain is very atypical on my history.   - would add imdur '15mg'$  daily (recommended during prior admission but not on d/c summary). Can continue beta blocker (can change from lopressor to coreg 3.'125mg'$  bid due to ongoing cocaine use). No plans for repeat cardiac testing at this time, worth trying titration of antianginals though not entirely clear this is angina. Perhaps more cocaine related chest pain, f/u UDS  - someongoing chest pain, asking for prn morphine as often af available.  - soft bp's limited further titration antianginals  - on reassessment of pain today reports tightness midchest radiating to both shoudlers and mid back constant x 2 days though some variability in severity, worst with deep breathing and moving. This is not cardiac chest pain, perhaps cocaine induced chest pain. No further cardiology recs  at this time, continue current medical therapy. Continue prn morphne and benzos with weaning, difficult assessment as he has substance abuse history and has been requesting these meds on the regular with dissatisfaction they are being weaned.       2. Rectal bleeding - hemorrhoidal procedure on 10/28/2022, and on 10/31/22.  - reports some ongoing bleeding, though Hgb improved to 11.6 this admit (9.5 at discharge in 10/2022)  We will sign off inpatient care, will make sure has f/u with [place] cardiology team   For questions or updates, please contact [place] Please consult [email or web address] for contact info under        Signed, Carlyle Dolly, MD  11/21/2022, 9:13 AM

## 2022-11-21 NOTE — Care Management Important Message (Signed)
Important Message  Patient Details  Name: Ethan Wells MRN: [id number] Date of Birth: 1971/01/22   Medicare Important Message Given:  N/A - LOS <3 / Initial given by admissions     Ihor Gully, LCSW 11/21/2022, 12:54 PM

## 2022-11-21 NOTE — Discharge Summary (Signed)
Physician Discharge Summary   [name] MRN: [id number] DOB: [birth year]  Admit date:     11/19/2022  Discharge date: 11/21/22  Discharge Physician: Barton Dubois   PCP: Nickola Major, NP   Recommendations at discharge:  Repeat basic metabolic panel to follow electrolytes and renal function Reassess blood pressure with adjustment to antihypertensive regimen as needed Continue assisting patient with tobacco, alcohol and cocaine cessation. Make sure patient has follow-up with cardiology service as instructed. Repeat CBC to follow hemoglobin trend/stability.  Discharge Diagnoses: Principal Problem:   Chest pain Active Problems:   Mesenteric artery stenosis (HCC)   CAD (coronary artery disease)   Tobacco abuse   Essential hypertension   Cocaine abuse ([place])   Polysubstance abuse ([place])   Abdominal pain   Alcohol dependence ([place])   Hyperlipidemia    Hospital Course: As per H&P written by Dr. Manuella Ghazi on 11/19/2022 Ethan Wells is a 51 y.o. male with medical history significant for CAD with prior PCI and recent NSTEMI 10/2022 with severe two-vessel CAD and stent avoided due to ongoing rectal bleed, PAD status post SMA stenting, dyslipidemia, hypertension, cocaine use, alcohol abuse, chronic anemia, and rectal bleeding status post hemorrhoidectomy with repeat ligation on 10/31/2022 who presented to the ED with complaints of chest pain over the last 2 days.  He was recently hospitalized at [facility] and had NSTEMI and cardiac catheterization with severe occlusions, but only medical therapy was recommended given his ongoing bleeding issues.  He was also noted to have rectal bleeding and he underwent repeat ligation of hemorrhoids as well.  He was also treated for acute CHF exacerbation at that time.   He states that he has not only had chest pain over the last 2 days, but some recurrent rectal bleeding and recent cocaine use.  He states that he has had ongoing pain  ever since his recent hospitalization and his chest as well as into his back.  He denies any shortness of breath, orthopnea, dyspnea on exertion, or pitting edema.  He states that he has been compliant with his medications and has been living in a camper at his friend's house and states that he has not had much heat.  He states that he drinks 5-6 alcoholic drinks on a daily basis that appears to be hard liquor based mixed with iced tea.   ED Course: Vital signs have been stable in the ED and hemoglobin 11.6 with prior 9.5.  Troponin is within normal limits and UDS is pending.  EKG shows some sinus tachycardia with no other acute abnormalities and right upper quadrant ultrasound with no findings to suggest cholecystitis.  He is currently asking for some pain medications.  Assessment and Plan: 1-chest pain -Patient with recent NSTEMI with cardiac cath in November 2023 -Negative troponin and no acute ischemic changes on telemetry or EKG -Patient continues to have chest discomfort most likely cocaine induced. -Continue treatment with aspirin, Crestor, Zetia, carvedilol and Imdur -Cocaine cessation counseling provided. -PPI has been started twice a day to assist if there is any component of reflux.   2-coronary artery disease with recent cardiac catheterization -Continue medical management -Continue patient follow-up with cardiology service -No plan for further heart cath or catheterization currently -Continue aspirin, statin, Zetia and the use of carvedilol and Imdur.   3-chronic diastolic heart failure -Compensated -Recent echocardiogram with preserved ejection fraction 55-60% -Heart healthy/low-sodium diet discussed with patient -Continue adjusted dose of Lasix. -Continue to follow daily weights.   4-cocaine abuse -  UDS positive -Cessation counseling provided -Avoiding metoprolol as previously prescribed in the setting of ongoing cocaine usage -Per cardiology okay to use carvedilol. -TOC  to provide outpatient resources to further assist with quitting.   5-hyperlipidemia/dyslipidemia -Continue Crestor consistently -Heart healthy diet discussed with patient.   6-tobacco abuse -Cessation counseling provided -Continue nicotine patch.   7-alcohol abuse -No frank withdrawal currently appreciated -Continue thiamine and folic acid -CIWA protocol and lorazepam provided while inpatient; patient has been discharged on Librium tapering. -TOC to provide outpatient resources to further assist in quitting.   8-mesenteric artery stenosis  -Status post stent to SMA October 24, 2022 -Continue aspirin, statin secondary factor modification -Continue the use of PPI.   9-class I obesity -Body mass index is 33.31 kg/m. -Low calorie diet, portion control and increase physical activity discussed with patient.   10-hyponatremia, hypokalemia and hypomagnesemia -Most likely associated with ongoing history of alcohol abuse and mild dehydration -Continue to replete electrolyte as needed -Follow electrolytes trend.  11-GERD -Continue PPI.   Consultants: Cardiology service Procedures performed: See below for x-ray reports. Disposition: Home Diet recommendation: Heart healthy/low-sodium diet.  DISCHARGE MEDICATION: Allergies as of 11/21/2022       Reactions   Penicillins Hives   Tolerated Ceftriaxone 10/2022   Other Rash   chives        Medication List     STOP taking these medications    ibuprofen 800 MG tablet Commonly known as: ADVIL   lisinopril 5 MG tablet Commonly known as: ZESTRIL   methocarbamol 750 MG tablet Commonly known as: Robaxin-750   metoprolol tartrate 25 MG tablet Commonly known as: LOPRESSOR       TAKE these medications    acetaminophen 500 MG tablet Commonly known as: TYLENOL Take 2 tablets (1,000 mg total) by mouth every 6 (six) hours as needed for mild pain.   aspirin 81 MG chewable tablet Chew 1 tablet (81 mg total) by mouth  daily.   calcium-vitamin D 500-5 MG-MCG tablet Commonly known as: OSCAL WITH D Take 1 tablet by mouth 2 (two) times daily.   chlordiazePOXIDE 5 MG capsule Commonly known as: LIBRIUM Take 3 tablets by mouth twice a day x 2 days; then 2 tablets by mouth twice a day x 2 days; then 1 tablet by mouth 3 times a day x 2 days; then 1 tablet by mouth twice a day x 2 days; then 1 tablet by mouth daily x 3 days and stop Librium.   ezetimibe 10 MG tablet Commonly known as: ZETIA Take 10 mg by mouth daily.   ferrous sulfate 325 (65 FE) MG tablet Take 1 tablet (325 mg total) by mouth daily with breakfast.   folic acid 1 MG tablet Commonly known as: FOLVITE Take 1 tablet (1 mg total) by mouth daily.   furosemide 40 MG tablet Commonly known as: LASIX Take 1 tablet (40 mg total) by mouth daily. What changed: when to take this   gabapentin 300 MG capsule Commonly known as: NEURONTIN Take 1 capsule (300 mg total) by mouth 3 (three) times daily.   isosorbide mononitrate 30 MG 24 hr tablet Commonly known as: IMDUR Take 0.5 tablets (15 mg total) by mouth daily. Start taking on: November 22, 2022   lidocaine 5 % ointment Commonly known as: XYLOCAINE Apply 1 Application topically 4 (four) times daily as needed. Apply to the perianal area   magnesium oxide 400 (240 Mg) MG tablet Commonly known as: MAG-OX Take 1 tablet (400 mg total) by  mouth 2 (two) times daily.   multivitamin tablet Take 1 tablet by mouth daily.   nicotine 14 mg/24hr patch Commonly known as: NICODERM CQ - dosed in mg/24 hours Place 1 patch (14 mg total) onto the skin daily.   nitroGLYCERIN 0.4 MG SL tablet Commonly known as: NITROSTAT Place 1 tablet (0.4 mg total) under the tongue every 5 (five) minutes as needed for chest pain.   pantoprazole 40 MG tablet Commonly known as: PROTONIX Take 1 tablet (40 mg total) by mouth 2 (two) times daily. What changed: when to take this   polyethylene glycol 17 g packet Commonly  known as: MIRALAX / GLYCOLAX Take 17 g by mouth daily.   psyllium 95 % Pack Commonly known as: HYDROCIL/METAMUCIL Take 1 packet by mouth daily.   rosuvastatin 40 MG tablet Commonly known as: CRESTOR Take 1 tablet (40 mg total) by mouth daily.   thiamine 100 MG tablet Commonly known as: Vitamin B-1 Take 1 tablet (100 mg total) by mouth daily.        Follow-up Information     Medina-Vargas, Monina C, NP. Schedule an appointment as soon as possible for a visit in 10 day(s).   Specialty: Internal Medicine Contact information: [address] [place] [address] [phone]                Discharge Exam: Danley Danker Weights   11/19/22 0624  Weight: 90.8 kg    General exam: Alert, awake, oriented x 3; restless and expressing ongoing intermittent and difficult to quantify it chest pain.  No acute ischemic changes appreciated on telemetry or EKG.  Patient is afebrile. Respiratory system: Clear to auscultation. Respiratory effort normal.  Good saturation on room air. Cardiovascular system:RRR. No rubs or gallops; no JVD. Gastrointestinal system: Abdomen is nondistended, soft and nontender. No organomegaly or masses felt. Normal bowel sounds heard. Central nervous system: Alert and oriented. No focal neurological deficits. Extremities: No cyanosis, clubbing or edema. Skin: No petechiae. Psychiatry: Judgement and insight appear normal. Mood & affect appropriate.   Condition at discharge: Stable and improved.  The results of significant diagnostics from this hospitalization (including imaging, microbiology, ancillary and laboratory) are listed below for reference.   Imaging Studies: US Abdomen Limited RUQ (LIVER/GB)  Result Date: 11/19/2022 CLINICAL DATA:  Abdominal pain EXAM: ULTRASOUND ABDOMEN LIMITED RIGHT UPPER QUADRANT COMPARISON:  None Available. FINDINGS: Gallbladder: No gallstones or wall thickening visualized. No sonographic Murphy sign noted by sonographer.  Common bile duct: Diameter: 0.5 cm, which is normal Liver: No focal lesion identified. Increased parenchymal echogenicity, which can with hepatic steatosis. Portal vein is patent on color Doppler imaging with normal direction of blood flow towards the liver. Other: None. IMPRESSION: No sonographic finding to suggest cholecystitis. Electronically Signed   By: Marin Roberts M.D.   On: 11/19/2022 10:32   DG Chest Portable 1 View  Result Date: 11/19/2022 CLINICAL DATA:  Chest pain EXAM: PORTABLE CHEST 1 VIEW COMPARISON:  10/29/2022 FINDINGS: 0737 hours. The lungs are clear without focal pneumonia, edema, pneumothorax or pleural effusion. The cardio pericardial silhouette is enlarged. The visualized bony structures of the thorax are unremarkable. Telemetry leads overlie the chest. IMPRESSION: No active disease. Electronically Signed   By: Misty Stanley M.D.   On: 11/19/2022 07:56   CARDIAC CATHETERIZATION  Addendum Date: 11/06/2022     Mid Cx lesion is 100% stenosed.   Ramus lesion is 85% stenosed.   Prox LAD to Mid LAD lesion is 5% stenosed.   Prox  RCA to Mid RCA lesion is 100% stenosed.   Prox RCA lesion is 80% stenosed.   There is mild left ventricular systolic dysfunction.   LV end diastolic pressure is moderately elevated.   The left ventricular ejection fraction is 45-50% by visual estimate. 1.  Severe two-vessel coronary artery disease with chronically occluded mid left circumflex and mid right coronary artery with collaterals.  These vessels were patent with mild disease on previous cardiac catheterization in 2012.  LAD stent is patent with minimal restenosis.  In addition, there is progression of proximal to mid ramus stenosis. 2.  Mildly reduced LV systolic function with an EF of 45%. 3.  Moderately elevated left ventricular end-diastolic pressure at 26 mmHg. Recommendations: The patient has 2 chronic occlusions with collaterals.  Recommend medical therapy for now.  I added small dose Imdur.  Given  ongoing rectal bleeding, risks of PCI outweigh the benefit at this time.  In the near future, once his bleeding issues are controlled and if he continues to have anginal symptoms, PCI of the ramus and possibly the right coronary artery can be considered.  The RCA has a microchannel of patent flow. Continue IV furosemide today given elevated LVEDP and switch to oral likely tomorrow.  Result Date: 11/06/2022   Mid Cx lesion is 100% stenosed.   Ramus lesion is 85% stenosed.   Prox LAD to Mid LAD lesion is 5% stenosed.   Prox RCA to Mid RCA lesion is 100% stenosed.   Prox RCA lesion is 80% stenosed.   There is mild left ventricular systolic dysfunction.   LV end diastolic pressure is moderately elevated.   The left ventricular ejection fraction is 45-50% by visual estimate. 1.  Severe two-vessel coronary artery disease with chronically occluded mid left circumflex and mid right coronary artery with collaterals.  Distal vessels were patent with mild disease on previous cardiac catheterization in 2012.  LAD stent is patent with minimal restenosis.  In addition, there is progression of proximal to mid ramus stenosis. 2.  Mildly reduced LV systolic function with an EF of 45%. 3.  Moderately elevated left ventricular end-diastolic pressure at 26 mmHg. Recommendations: The patient has 2 chronic occlusions with collaterals.  Recommend medical therapy for now.  I added small dose Imdur.  Given ongoing rectal bleeding, risks of PCI outweigh the benefit at this time.  In the near future, once his bleeding issues are controlled and if he continues to have anginal symptoms, PCI of the ramus and possibly the right coronary artery can be considered.  The RCA has a microchannel of patent flow. Continue IV furosemide today given elevated LVEDP and switch to oral likely tomorrow.   ECHOCARDIOGRAM LIMITED  Result Date: 10/30/2022    ECHOCARDIOGRAM LIMITED REPORT   Patient Name:   Ethan Wells Date of Exam: 10/30/2022  Medical Rec #:  [id number]           Height:       65.0 in Accession #:    [id number]          Weight:       186.3 lb Date of Birth:  August 11, 1971           BSA:          1.919 m Patient Age:    51 years            BP:           112/64 mmHg Patient Gender: M  HR:           88 bpm. Exam Location:  [facility] Procedure: Limited Echo, Limited Color Doppler, Cardiac Doppler and Intracardiac            Opacification Agent Indications:     I24.9 Acute ischemic heart disease, unspecified  History:         Patient has prior history of Echocardiogram examinations, most                  recent 10/01/2022. Ischemic Cardiomyopathy, CAD; Risk                  Factors:Hypertension, Dyslipidemia and Current Smoker.  Sonographer:     Charmayne Sheer Referring Phys:  [phone] CHRISTOPHER END Diagnosing Phys: Kate Sable MD  Sonographer Comments: Technically difficult study due to poor echo windows. Image acquisition challenging due to patient body habitus. IMPRESSIONS  1. Left ventricular ejection fraction, by estimation, is 55 to 60%. The left ventricle has normal function. The left ventricle has no regional wall motion abnormalities.  2. Right ventricular systolic function is normal. The right ventricular size is normal.  3. The mitral valve is normal in structure. No evidence of mitral valve regurgitation.  4. The aortic valve is normal in structure. Aortic valve regurgitation is not visualized.  5. The inferior vena cava is normal in size with greater than 50% respiratory variability, suggesting right atrial pressure of 3 mmHg. FINDINGS  Left Ventricle: Left ventricular ejection fraction, by estimation, is 55 to 60%. The left ventricle has normal function. The left ventricle has no regional wall motion abnormalities. Definity contrast agent was given IV to delineate the left ventricular  endocardial borders. The left ventricular internal cavity size was normal in size. Right Ventricle: The right ventricular size is normal.  Right ventricular systolic function is normal. Mitral Valve: The mitral valve is normal in structure. Tricuspid Valve: The tricuspid valve is normal in structure. Tricuspid valve regurgitation is trivial. Aortic Valve: The aortic valve is normal in structure. Aortic valve regurgitation is not visualized. Aorta: The aortic root and ascending aorta are structurally normal, with no evidence of dilitation. Venous: The inferior vena cava is normal in size with greater than 50% respiratory variability, suggesting right atrial pressure of 3 mmHg. Additional Comments: Spectral Doppler performed. Color Doppler performed.  LEFT VENTRICLE PLAX 2D LVIDd:         5.40 cm LVIDs:         3.40 cm LV PW:         1.30 cm LV IVS:        1.00 cm  LEFT ATRIUM         Index LA diam:    4.90 cm 2.55 cm/m Kate Sable MD Electronically signed by Kate Sable MD Signature Date/Time: 10/30/2022/9:51:06 AM    Final    CT Angio Chest Pulmonary Embolism (PE) W or WO Contrast  Result Date: 10/29/2022 CLINICAL DATA:  Pulmonary embolism (PE) suspected, low to intermediate prob, positive D-dimer EXAM: CT ANGIOGRAPHY CHEST WITH CONTRAST TECHNIQUE: Multidetector CT imaging of the chest was performed using the standard protocol during bolus administration of intravenous contrast. Multiplanar CT image reconstructions and MIPs were obtained to evaluate the vascular anatomy. RADIATION DOSE REDUCTION: This exam was performed according to the departmental dose-optimization program which includes automated exposure control, adjustment of the mA and/or kV according to patient size and/or use of iterative reconstruction technique. CONTRAST:  70m OMNIPAQUE IOHEXOL 350 MG/ML SOLN COMPARISON:  Chest x-ray  10/29/2022, CT angio chest 09/30/2022 FINDINGS: Cardiovascular: Satisfactory opacification of the pulmonary arteries to the segmental level. No evidence of pulmonary embolism. The main pulmonary artery is enlarged measuring up to 3.6 cm. Normal  heart size. No significant pericardial effusion. The thoracic aorta is normal in caliber. Mild atherosclerotic plaque of the thoracic aorta. At least 2 vessel coronary artery calcifications. Mediastinum/Nodes: No enlarged mediastinal, hilar, or axillary lymph nodes. Thyroid gland, trachea, and esophagus demonstrate no significant findings. Lungs/Pleura: Peribronchovascular ground-glass airspace opacities scattered throughout the lungs and slightly more prominent in the upper lobes. No pulmonary nodule. No pulmonary mass. Trace bilateral pleural effusions. No pneumothorax. Upper Abdomen: Splenomegaly. Query pericholecystic fluid and gallbladder wall thickening Musculoskeletal: No chest wall abnormality. No suspicious lytic or blastic osseous lesions. No acute displaced fracture. Review of the MIP images confirms the above findings. IMPRESSION: 1. No pulmonary embolus. 2. Enlarged main pulmonary artery suggestive of pulmonary hypertension. 3. Diffuse peribronchovascular ground-glass airspace opacities suggestive of infection/inflammation. Consider repeat CT chest in 3 months to evaluate for resolution. 4. Trace bilateral pleural effusions. 5. Splenomegaly. 6. Query pericholecystic fluid and gallbladder wall thickening. Correlate with liver function-if clinically indicated consider right upper quadrant ultrasound. Electronically Signed   By: Iven Finn M.D.   On: 10/29/2022 17:15   DG Chest Port 1 View  Result Date: 10/29/2022 CLINICAL DATA:  Shortness of breath EXAM: PORTABLE CHEST 1 VIEW COMPARISON:  Chest x-ray dated October 21, 2022 FINDINGS: Unchanged cardiomegaly. New diffuse interstitial opacities. No large pleural effusion or evidence of pneumothorax. IMPRESSION: New diffuse interstitial opacities, compatible with pulmonary edema. Electronically Signed   By: Yetta Glassman M.D.   On: 10/29/2022 11:02   [place] Lower Ext Art Right Ltd  Result Date: 10/26/2022 CLINICAL DATA:  Groin pain 3 days post  visceral arteriogram EXAM: RIGHT LOWER EXTREMITY ARTERIAL DUPLEX SCAN TECHNIQUE: Gray-scale sonography as well as color Doppler and duplex ultrasound was performed to evaluate the right lower extremity arteries including the common, superficial and profunda femoral arteries, popliteal artery and calf arteries. COMPARISON:  CT 09/30/2022 FINDINGS: Normal waveforms in the common and superficial femoral arteries. Normal color Doppler signal and common, deep, and superficial femoral arteries. No evidence of pseudoaneurysm. Normal venous color signal and waveforms in the right common femoral vein, femoral vein, and GSV. No evidence of AV fistula. No regional hematoma. IMPRESSION: Negative. Electronically Signed   By: Lucrezia Europe M.D.   On: 10/26/2022 20:00   PERIPHERAL VASCULAR CATHETERIZATION  Result Date: 10/24/2022 See surgical note for result.   Microbiology: Results for orders placed or performed during the hospital encounter of 10/21/22  MRSA Next Gen by PCR, Nasal     Status: None   Collection Time: 10/30/22  9:27 PM   Specimen: Nasal Mucosa; Nasal Swab  Result Value Ref Range Status   MRSA by PCR Next Gen NOT DETECTED NOT DETECTED Final    Comment: (NOTE) The GeneXpert MRSA Assay (FDA approved for NASAL specimens only), is one component of a comprehensive MRSA colonization surveillance program. It is not intended to diagnose MRSA infection nor to guide or monitor treatment for MRSA infections. Test performance is not FDA approved in patients less than 51 years old. Performed at [facility], [address] [place], [place] [address]     Labs: CBC: Recent Labs  Lab 11/19/22 0716 11/20/22 0451 11/21/22 0544  WBC 8.2 6.4 4.3  NEUTROABS 5.8  --   --   HGB 11.6* 9.9* 8.9*  HCT 34.8* 31.3* 27.6*  MCV 96.1 97.5 100.0  PLT 161 127* 871*   Basic Metabolic Panel: Recent Labs  Lab 11/19/22 0716 11/20/22 0451 11/21/22 0544  NA 137 133* 136  K 3.5 3.4* 3.7  CL 104 101  105  CO2 20* 23 23  GLUCOSE 111* 103* 137*  BUN <5* <5* 5*  CREATININE 0.55* 0.74 0.74  CALCIUM 9.2 8.3* 8.1*  MG  --  1.5* 2.0   Liver Function Tests: Recent Labs  Lab 11/19/22 0716 11/20/22 0451  AST 46* 41  ALT 29 22  ALKPHOS 229* 222*  BILITOT 1.5* 2.3*  PROT 7.9 7.2  ALBUMIN 3.4* 3.2*   CBG: No results for input(s): "GLUCAP" in the last 168 hours.  Discharge time spent: greater than 30 minutes.  Signed: Barton Dubois, MD [facility] 11/21/2022

## 2022-11-21 NOTE — Progress Notes (Signed)
Patient slept on and off this shift. Patient called out three times for pain medication. Rating of 9 on 0-10 scale, with pain not rating below a 9. Continued to monitor patient.

## 2022-11-22 ENCOUNTER — Ambulatory Visit: Payer: Medicare HMO | Admitting: Cardiovascular Disease

## 2022-11-22 MED ORDER — ORAL CARE MOUTH RINSE: 15.0000 mL | OROMUCOSAL | Status: DC | PRN

## 2022-11-22 MED ORDER — MORPHINE SULFATE (PF) 2 MG/ML IV SOLN
1.0000 mg | INTRAVENOUS | Status: DC | PRN
  Administered 2022-11-22 – 2022-11-23 (×6): 1 mg via INTRAVENOUS
  Filled 2022-11-22 (×6): qty 1

## 2022-11-22 MED ORDER — OXYCODONE HCL 5 MG PO TABS
5.0000 mg | ORAL_TABLET | Freq: Four times a day (QID) | ORAL | Status: DC | PRN
  Administered 2022-11-22 – 2022-11-23 (×5): 5 mg via ORAL
  Filled 2022-11-22 (×5): qty 1

## 2022-11-22 NOTE — Telephone Encounter (Signed)
Hospital

## 2022-11-22 NOTE — Progress Notes (Signed)
Patient seen and examined. Hemodynamically stable and in no distress. Continue to expressed florid complaints and diffuse pain affecting chest, neck, shoulders, back and epigastric area; reports mild relief with use of pain meds essentially on schedule basis and mad if not giving on time. Per discussion with cardiology patient will need outpatient assistance for detox and pain management; work up ruling out the presence of ischemic component currently. Will continue medical management as recommended. Patient has appealed his discharge and expressed that staying here is cheaper than previous hospital he was on in the past.  Ethan Dubois MD [phone]

## 2022-11-22 NOTE — Progress Notes (Signed)
Patient appealed discharge.  Notes uploaded to [facility]. Case Number is [id number] Documents received and in clinical review as of 11/22/22, 4:25pm

## 2022-11-22 NOTE — Progress Notes (Signed)
Patient slept on and off. Morphine given three times this shift patient rating pain at 9 of 0-10.

## 2022-11-23 MED ORDER — FUROSEMIDE 20 MG PO TABS
20.0000 mg | ORAL_TABLET | Freq: Every day | ORAL | Status: DC
  Administered 2022-11-24: 20 mg via ORAL
  Filled 2022-11-23: qty 1

## 2022-11-23 MED ORDER — MIDODRINE HCL 5 MG PO TABS
2.5000 mg | ORAL_TABLET | Freq: Three times a day (TID) | ORAL | Status: DC
  Administered 2022-11-23 – 2022-11-24 (×4): 2.5 mg via ORAL
  Filled 2022-11-23 (×4): qty 1

## 2022-11-23 NOTE — Progress Notes (Signed)
Patient was seen and examined today, he is hemodynamically stable, in no apparent distress, complaints for the patient I am weak all over, but he denies any chest pain or any specific complaints, he appealed his discharge yesterday, he is medically clear for discharge, awaiting appeal process. -Lungs clear to auscultation, no wheezing or rhonchi -Regular rate and rhythm -Abdomen soft -Extremities with no edema.  Phillips Climes MD

## 2022-11-24 MED ORDER — MIDODRINE HCL 2.5 MG PO TABS: 2.5000 mg | ORAL_TABLET | Freq: Three times a day (TID) | ORAL | 0 refills | Status: DC

## 2022-11-24 NOTE — Progress Notes (Addendum)
Pt c/o generalized abdominal pain, bilateral arm and leg pain that he reports has been going on since he arrived here and "no one cares to do anything about". He rates his pain  8/10 and when asked what it feels like  he states " all of them" when this RN provided pain characteristic examples. Tylenol was administered. He reports to this RN that he just had a small BM with BRB. Pt recently had hemorrhoid surgery. Bryson Corona Edd Fabian

## 2022-11-24 NOTE — Progress Notes (Signed)
[facility] Discharge Note  Admit Date:  11/19/2022 Discharge date: 11/24/2022   [name] to be D/C'd Home per MD order.  AVS completed. Patient/caregiver able to verbalize understanding.  Discharge Medication: Allergies as of 11/24/2022       Reactions   Penicillins Hives   Tolerated Ceftriaxone 10/2022   Other Rash   chives        Medication List     STOP taking these medications    ibuprofen 800 MG tablet Commonly known as: ADVIL   lisinopril 5 MG tablet Commonly known as: ZESTRIL   methocarbamol 750 MG tablet Commonly known as: Robaxin-750   metoprolol tartrate 25 MG tablet Commonly known as: LOPRESSOR       TAKE these medications    acetaminophen 500 MG tablet Commonly known as: TYLENOL Take 2 tablets (1,000 mg total) by mouth every 6 (six) hours as needed for mild pain.   aspirin 81 MG chewable tablet Chew 1 tablet (81 mg total) by mouth daily.   calcium-vitamin D 500-5 MG-MCG tablet Commonly known as: OSCAL WITH D Take 1 tablet by mouth 2 (two) times daily.   chlordiazePOXIDE 5 MG capsule Commonly known as: LIBRIUM Take 3 tablets by mouth twice a day x 2 days; then 2 tablets by mouth twice a day x 2 days; then 1 tablet by mouth 3 times a day x 2 days; then 1 tablet by mouth twice a day x 2 days; then 1 tablet by mouth daily x 3 days and stop Librium.   ezetimibe 10 MG tablet Commonly known as: ZETIA Take 10 mg by mouth daily.   ferrous sulfate 325 (65 FE) MG tablet Take 1 tablet (325 mg total) by mouth daily with breakfast.   folic acid 1 MG tablet Commonly known as: FOLVITE Take 1 tablet (1 mg total) by mouth daily.   furosemide 40 MG tablet Commonly known as: LASIX Take 1 tablet (40 mg total) by mouth daily. What changed: when to take this   gabapentin 300 MG capsule Commonly known as: NEURONTIN Take 1 capsule (300 mg total) by mouth 3 (three) times daily.   isosorbide mononitrate 30 MG 24 hr tablet Commonly known as: IMDUR Take  0.5 tablets (15 mg total) by mouth daily.   lidocaine 5 % ointment Commonly known as: XYLOCAINE Apply 1 Application topically 4 (four) times daily as needed. Apply to the perianal area   magnesium oxide 400 (240 Mg) MG tablet Commonly known as: MAG-OX Take 1 tablet (400 mg total) by mouth 2 (two) times daily.   midodrine 2.5 MG tablet Commonly known as: PROAMATINE Take 1 tablet (2.5 mg total) by mouth 3 (three) times daily with meals.   multivitamin tablet Take 1 tablet by mouth daily.   nicotine 14 mg/24hr patch Commonly known as: NICODERM CQ - dosed in mg/24 hours Place 1 patch (14 mg total) onto the skin daily.   nitroGLYCERIN 0.4 MG SL tablet Commonly known as: NITROSTAT Place 1 tablet (0.4 mg total) under the tongue every 5 (five) minutes as needed for chest pain.   pantoprazole 40 MG tablet Commonly known as: PROTONIX Take 1 tablet (40 mg total) by mouth 2 (two) times daily. What changed: when to take this   polyethylene glycol 17 g packet Commonly known as: MIRALAX / GLYCOLAX Take 17 g by mouth daily.   psyllium 95 % Pack Commonly known as: HYDROCIL/METAMUCIL Take 1 packet by mouth daily.   rosuvastatin 40 MG tablet Commonly known as: CRESTOR  Take 1 tablet (40 mg total) by mouth daily.   thiamine 100 MG tablet Commonly known as: Vitamin B-1 Take 1 tablet (100 mg total) by mouth daily.        Discharge Assessment: Vitals:   11/24/22 0548 11/24/22 1338  BP: 111/64 (!) 97/59  Pulse: 80 83  Resp: 16 18  Temp: 97.6 F (36.4 C) 98.5 F (36.9 C)  SpO2: 98% 98%   Skin clean, dry and intact without evidence of skin break down, no evidence of skin tears noted. IV catheter discontinued intact. Site without signs and symptoms of complications - no redness or edema noted at insertion site, patient denies c/o pain - only slight tenderness at site.  Dressing with slight pressure applied.  D/c Instructions-Education: Discharge instructions given to  patient/family with verbalized understanding. D/c education completed with patient/family including follow up instructions, medication list, d/c activities limitations if indicated, with other d/c instructions as indicated by MD - patient able to verbalize understanding, all questions fully answered. Patient instructed to return to ED, call 911, or call MD for any changes in condition.  Patient escorted via [place], and D/C home via private auto.  Richrd Prime, LPN 30/16/0109 3:23 PM

## 2022-11-24 NOTE — Progress Notes (Signed)
Hemodynamically stable; reports no chest pain, no nausea or vomiting.  Still having some Florie diffuse pain on his legs right costal area, lower abdomen and back.  Tolerating diet and afebrile.  Waiting final decisions on his discharge appeal.  No active withdrawal.  Ready for discharge.  Barton Dubois MD [phone]

## 2022-11-24 NOTE — Progress Notes (Signed)
CSW was notified by Marlowe Sax supervisor that patient was denied for the discharge appeal he requested. Patient can be discharged at this time.  CSW informed MD of information.   Madilyn Fireman, MSW, LCSW Transitions of Care  Clinical Social Worker II [phone]

## 2022-11-24 NOTE — Progress Notes (Signed)
Report received from [facility]. Upon this RN's assessment patient is resting but wakes to this RN emptying urinal.  He denies further needs. Ethan Wells

## 2022-11-25 ENCOUNTER — Telehealth: Payer: Self-pay | Admitting: *Deleted

## 2022-11-25 NOTE — Progress Notes (Signed)
  Care Coordination  Note  11/25/2022 Name: [name] MRN: [id number] DOB: 27-Apr-1971  Ethan Wells is a 51 y.o. year old primary care patient of Medina-Vargas, Monina C, NP. I reached out to Ethan Wells by phone today to assist with scheduling a follow up appointment. Ethan Wells verbally consented to my assistance.       Follow up plan: Hospital Follow Up appointment scheduled with Erling Conte NP on 11/02/22 at 1:00 PM  [place]: [phone]

## 2022-11-25 NOTE — Progress Notes (Signed)
  Care Coordination   Note   11/25/2022 Name: [name] MRN: [id number] DOB: 07/24/1971  Ethan Wells is a 51 y.o. year old male who sees Medina-Vargas, Monina C, NP for primary care. I reached out to Ethan Wells by phone today to offer care coordination services.  [name] was given information about Care Coordination services today including:   The Care Coordination services include support from the care team which includes your Nurse Coordinator, Clinical Social Worker, or Pharmacist.  The Care Coordination team is here to help remove barriers to the health concerns and goals most important to you. Care Coordination services are voluntary, and the patient may decline or stop services at any time by request to their care team member.   Care Coordination Consent Status: Patient did not agree to participate in care coordination services at this time.    Encounter Outcome:  Pt. Refused Patient is currently living in a camper and declined to speak with Care Team.   [place]  Direct Dial: [phone]

## 2022-11-27 ENCOUNTER — Encounter: Payer: Medicare HMO | Admitting: Surgery

## 2022-12-02 ENCOUNTER — Encounter: Payer: Medicare HMO | Admitting: Adult Health

## 2022-12-02 NOTE — Progress Notes (Signed)
This encounter was created in error - please disregard.

## 2022-12-12 ENCOUNTER — Emergency Department (HOSPITAL_COMMUNITY): Payer: Medicare HMO

## 2022-12-12 ENCOUNTER — Emergency Department (HOSPITAL_COMMUNITY)
Admission: EM | Admit: 2022-12-12 | Discharge: 2022-12-12 | Disposition: A | Discharge status: Home / Self Care | Payer: Medicare HMO | Attending: Emergency Medicine | Admitting: Emergency Medicine

## 2022-12-12 DIAGNOSIS — B349 Viral infection, unspecified: Secondary | ICD-10-CM | POA: Insufficient documentation

## 2022-12-12 DIAGNOSIS — R519 Headache, unspecified: Secondary | ICD-10-CM | POA: Diagnosis present

## 2022-12-12 DIAGNOSIS — I959 Hypotension, unspecified: Secondary | ICD-10-CM | POA: Diagnosis not present

## 2022-12-12 DIAGNOSIS — Z20822 Contact with and (suspected) exposure to covid-19: Secondary | ICD-10-CM | POA: Insufficient documentation

## 2022-12-12 DIAGNOSIS — R110 Nausea: Secondary | ICD-10-CM | POA: Diagnosis not present

## 2022-12-12 DIAGNOSIS — R112 Nausea with vomiting, unspecified: Secondary | ICD-10-CM | POA: Diagnosis not present

## 2022-12-12 DIAGNOSIS — Z7982 Long term (current) use of aspirin: Secondary | ICD-10-CM | POA: Insufficient documentation

## 2022-12-12 DIAGNOSIS — R070 Pain in throat: Secondary | ICD-10-CM | POA: Diagnosis not present

## 2022-12-12 DIAGNOSIS — R6883 Chills (without fever): Secondary | ICD-10-CM | POA: Diagnosis not present

## 2022-12-12 DIAGNOSIS — R509 Fever, unspecified: Secondary | ICD-10-CM | POA: Diagnosis not present

## 2022-12-12 DIAGNOSIS — R0789 Other chest pain: Secondary | ICD-10-CM | POA: Diagnosis not present

## 2022-12-12 DIAGNOSIS — R079 Chest pain, unspecified: Secondary | ICD-10-CM | POA: Diagnosis not present

## 2022-12-12 LAB — CBC
HCT: 33.9 % — ABNORMAL LOW (ref 39.0–52.0)
Hemoglobin: 11.1 g/dL — ABNORMAL LOW (ref 13.0–17.0)
MCH: 31.8 pg (ref 26.0–34.0)
MCHC: 32.7 g/dL (ref 30.0–36.0)
MCV: 97.1 fL (ref 80.0–100.0)
Platelets: 142 10*3/uL — ABNORMAL LOW (ref 150–400)
RBC: 3.49 MIL/uL — ABNORMAL LOW (ref 4.22–5.81)
RDW: 15.5 % (ref 11.5–15.5)
WBC: 7.9 10*3/uL (ref 4.0–10.5)
nRBC: 0 % (ref 0.0–0.2)

## 2022-12-12 LAB — RESP PANEL BY RT-PCR (RSV, FLU A&B, COVID)  RVPGX2
Influenza A by PCR: NEGATIVE
Influenza B by PCR: NEGATIVE
Resp Syncytial Virus by PCR: NEGATIVE
SARS Coronavirus 2 by RT PCR: NEGATIVE

## 2022-12-12 LAB — TROPONIN I (HIGH SENSITIVITY): Troponin I (High Sensitivity): 7 ng/L (ref <18)

## 2022-12-12 LAB — BASIC METABOLIC PANEL
Anion gap: 9 (ref 5–15)
BUN: 6 mg/dL (ref 6–20)
CO2: 22 mmol/L (ref 22–32)
Calcium: 8.4 mg/dL — ABNORMAL LOW (ref 8.9–10.3)
Chloride: 106 mmol/L (ref 98–111)
Creatinine, Ser: 0.46 mg/dL — ABNORMAL LOW (ref 0.61–1.24)
GFR, Estimated: >60 mL/min (ref >60)
Glucose, Bld: 103 mg/dL — ABNORMAL HIGH (ref 70–99)
Potassium: 3.5 mmol/L (ref 3.5–5.1)
Sodium: 137 mmol/L (ref 135–145)

## 2022-12-12 MED ORDER — KETOROLAC TROMETHAMINE 60 MG/2ML IM SOLN
60.0000 mg | Freq: Once | INTRAMUSCULAR | Status: AC
  Administered 2022-12-12: 60 mg via INTRAMUSCULAR
  Filled 2022-12-12: qty 2

## 2022-12-12 MED ORDER — DEXAMETHASONE SODIUM PHOSPHATE 10 MG/ML IJ SOLN
10.0000 mg | Freq: Once | INTRAMUSCULAR | Status: AC
  Administered 2022-12-12: 10 mg via INTRAMUSCULAR
  Filled 2022-12-12: qty 1

## 2022-12-12 NOTE — ED Provider Notes (Signed)
[facility] EMERGENCY DEPARTMENT Provider Note   CSN: [id number] Arrival date & time: 12/12/22  1834     History  Chief Complaint  Patient presents with   Generalized Body Aches   Chest Pain    Ethan Wells is a 51 y.o. male here with generalized body aches and pains for the past 6 days.  Symptoms began on Saturday.  He said he was around several sick family members around Christmas time, including grandchildren with COVID.  He has been having body aches, headache, muscle aches, laryngitis, nausea.  HPI     Home Medications Prior to Admission medications   Medication Sig Start Date End Date Taking? Authorizing Provider  acetaminophen (TYLENOL) 500 MG tablet Take 2 tablets (1,000 mg total) by mouth every 6 (six) hours as needed for mild pain. 10/28/22   Olean Ree, MD  aspirin 81 MG chewable tablet Chew 1 tablet (81 mg total) by mouth daily. 11/06/22 02/04/23  Enzo Bi, MD  calcium-vitamin D (OSCAL WITH D) 500-5 MG-MCG tablet Take 1 tablet by mouth 2 (two) times daily. Patient not taking: Reported on 11/15/2022 10/17/22   Medina-Vargas, Monina C, NP  chlordiazePOXIDE (LIBRIUM) 5 MG capsule Take 3 tablets by mouth twice a day x 2 days; then 2 tablets by mouth twice a day x 2 days; then 1 tablet by mouth 3 times a day x 2 days; then 1 tablet by mouth twice a day x 2 days; then 1 tablet by mouth daily x 3 days and stop Librium. 11/21/22   Barton Dubois, MD  ezetimibe (ZETIA) 10 MG tablet Take 10 mg by mouth daily. Patient not taking: Reported on 11/19/2022    [provider]  ferrous sulfate 325 (65 FE) MG tablet Take 1 tablet (325 mg total) by mouth daily with breakfast. Patient not taking: Reported on 11/15/2022 11/06/22 02/04/23  Enzo Bi, MD  folic acid (FOLVITE) 1 MG tablet Take 1 tablet (1 mg total) by mouth daily. Patient not taking: Reported on 11/15/2022 11/06/22   Enzo Bi, MD  furosemide (LASIX) 40 MG tablet Take 1 tablet (40 mg total) by mouth daily.  11/21/22 02/19/23  Barton Dubois, MD  gabapentin (NEURONTIN) 300 MG capsule Take 1 capsule (300 mg total) by mouth 3 (three) times daily. 10/17/22   Medina-Vargas, Monina C, NP  isosorbide mononitrate (IMDUR) 30 MG 24 hr tablet Take 0.5 tablets (15 mg total) by mouth daily. 11/22/22   Barton Dubois, MD  lidocaine (XYLOCAINE) 5 % ointment Apply 1 Application topically 4 (four) times daily as needed. Apply to the perianal area Patient not taking: Reported on 11/19/2022 10/28/22   Olean Ree, MD  magnesium oxide (MAG-OX) 400 (240 Mg) MG tablet Take 1 tablet (400 mg total) by mouth 2 (two) times daily. Patient not taking: Reported on 11/15/2022 11/06/22   Enzo Bi, MD  midodrine (PROAMATINE) 2.5 MG tablet Take 1 tablet (2.5 mg total) by mouth 3 (three) times daily with meals. 11/24/22   Barton Dubois, MD  Multiple Vitamins-Minerals (MULTIVITAMIN) tablet Take 1 tablet by mouth daily. Patient not taking: Reported on 11/19/2022 11/06/22 02/04/23  Enzo Bi, MD  nicotine (NICODERM CQ - DOSED IN MG/24 HOURS) 14 mg/24hr patch Place 1 patch (14 mg total) onto the skin daily. 11/21/22   Barton Dubois, MD  nitroGLYCERIN (NITROSTAT) 0.4 MG SL tablet Place 1 tablet (0.4 mg total) under the tongue every 5 (five) minutes as needed for chest pain. 11/06/22   Enzo Bi, MD  pantoprazole ([place]) 40  MG tablet Take 1 tablet (40 mg total) by mouth 2 (two) times daily. 11/21/22   Barton Dubois, MD  polyethylene glycol (MIRALAX / GLYCOLAX) 17 g packet Take 17 g by mouth daily. Patient not taking: Reported on 11/15/2022 11/06/22 02/04/23  Enzo Bi, MD  psyllium (HYDROCIL/METAMUCIL) 95 % PACK Take 1 packet by mouth daily. Patient not taking: Reported on 11/19/2022 11/06/22 02/04/23  Enzo Bi, MD  rosuvastatin (CRESTOR) 40 MG tablet Take 1 tablet (40 mg total) by mouth daily. 09/23/22   Medina-Vargas, Monina C, NP  thiamine (VITAMIN B-1) 100 MG tablet Take 1 tablet (100 mg total) by mouth daily. Patient not taking: Reported on  11/15/2022 11/06/22   Enzo Bi, MD      Allergies    Penicillins and Other    Review of Systems   Review of Systems  Physical Exam Updated Vital Signs BP 119/60   Pulse 100   Temp 97.9 F (36.6 C)   Resp 18   Ht '5\' 5"'$  (1.651 m)   Wt 93 kg   SpO2 100%   BMI 34.11 kg/m  Physical Exam Constitutional:      General: He is not in acute distress.    Comments: Hoarse voice  HENT:     Head: Normocephalic and atraumatic.  Eyes:     Conjunctiva/sclera: Conjunctivae normal.     Pupils: Pupils are equal, round, and reactive to light.  Cardiovascular:     Rate and Rhythm: Normal rate and regular rhythm.  Pulmonary:     Effort: Pulmonary effort is normal. No respiratory distress.  Abdominal:     General: There is no distension.     Tenderness: There is no abdominal tenderness.  Skin:    General: Skin is warm and dry.  Neurological:     General: No focal deficit present.     Mental Status: He is alert. Mental status is at baseline.  Psychiatric:        Mood and Affect: Mood normal.        Behavior: Behavior normal.     ED Results / Procedures / Treatments   Labs (all labs ordered are listed, but only abnormal results are displayed) Labs Reviewed  BASIC METABOLIC PANEL - Abnormal; Notable for the following components:      Result Value   Glucose, Bld 103 (*)    Creatinine, Ser 0.46 (*)    Calcium 8.4 (*)    All other components within normal limits  CBC - Abnormal; Notable for the following components:   RBC 3.49 (*)    Hemoglobin 11.1 (*)    HCT 33.9 (*)    Platelets 142 (*)    All other components within normal limits  RESP PANEL BY RT-PCR (RSV, FLU A&B, COVID)  RVPGX2  TROPONIN I (HIGH SENSITIVITY)    EKG EKG Interpretation  Date/Time:  Thursday December 12 2022 20:23:00 EST Ventricular Rate:  102 PR Interval:  150 QRS Duration: 80 QT Interval:  360 QTC Calculation: 469 R Axis:   82 Text Interpretation: Sinus tachycardia Low voltage QRS Borderline ECG  When compared with ECG of 19-Nov-2022 09:00, PREVIOUS ECG IS PRESENT Confirmed by Octaviano Glow [phone]) on 12/12/2022 10:37:00 PM  Radiology DG Chest 2 View  Result Date: 12/12/2022 CLINICAL DATA:  Chest pain, chills, body aches EXAM: CHEST - 2 VIEW COMPARISON:  11/19/2022 FINDINGS: Frontal and lateral views of the chest demonstrate a stable cardiac silhouette. No acute airspace disease, effusion, or pneumothorax. No acute bony abnormality. IMPRESSION:  1. Stable chest, no acute process. Electronically Signed   By: Randa Ngo M.D.   On: 12/12/2022 20:47    Procedures Procedures    Medications Ordered in ED Medications  dexamethasone (DECADRON) injection 10 mg (10 mg Intramuscular Given 12/12/22 2318)  ketorolac (TORADOL) injection 60 mg (60 mg Intramuscular Given 12/12/22 2318)    ED Course/ Medical Decision Making/ A&P                           Medical Decision Making Amount and/or Complexity of Data Reviewed Labs: ordered. Radiology: ordered.  Risk Prescription drug management.   Patient is here with constellation of symptoms I strongly suspect is a viral syndrome.  His COVID flu and RSV test are negative.  However this may be another type of virus.  Most viruses run their course in 5 to 7 days and I explained to the patient I expect that he is near the end of his illness.  Given his general myalgias and misery I think is reasonable to treat with IM Decadron and IM Toradol here in the ED.  Otherwise I reviewed his labs and I do not see signs of more serious or life concerning infection, dehydration, anemia, leukocytosis or sepsis.  I personally reviewed and interpreted the patient's EKG which shows no acute ischemic findings.  I reviewed the patient's chest x-ray which shows no focal infiltrate.  The patient had reported to triage some passive SI.  He tells me "I just do not have anywhere to go or a ride to get home."  I strongly suspect that the patient is malingering as he  repeatedly asked me to "just somewhere to stay in the hospital".  Do not suspect acute suicidality at this time, and I do not see grounds for emergent psych evaluation.         Final Clinical Impression(s) / ED Diagnoses Final diagnoses:  Viral syndrome    Rx / DC Orders ED Discharge Orders     None         Wyvonnia Dusky, MD 12/12/22 2326

## 2022-12-12 NOTE — ED Notes (Signed)
Went over dc papers. Verbalized understanding. Ambulatory to lobby.  

## 2022-12-12 NOTE — ED Notes (Signed)
While in waiting room pt went to reception and told them that he thought he was having a heart attack. Pt brought back in to triage. EKG done cleared with MD and chest pain triage orders put in

## 2022-12-12 NOTE — ED Triage Notes (Signed)
Pt BIB [facility] from home C/O COVID exposure. Reports chills, generalized body aches.    98 99% 118/60

## 2022-12-12 NOTE — ED Triage Notes (Signed)
Pt reports SI, denies plan. States "I'm just hurtin' so bad I can't stand it."

## 2022-12-12 NOTE — ED Notes (Signed)
Ambulatory to tx room . C/o being cold. Blanket provided

## 2022-12-19 ENCOUNTER — Other Ambulatory Visit: Payer: Self-pay | Admitting: Adult Health

## 2022-12-19 ENCOUNTER — Telehealth (INDEPENDENT_AMBULATORY_CARE_PROVIDER_SITE_OTHER): Payer: Medicare HMO | Admitting: Adult Health

## 2022-12-19 DIAGNOSIS — J069 Acute upper respiratory infection, unspecified: Secondary | ICD-10-CM | POA: Diagnosis not present

## 2022-12-19 DIAGNOSIS — E782 Mixed hyperlipidemia: Secondary | ICD-10-CM

## 2022-12-19 DIAGNOSIS — I10 Essential (primary) hypertension: Secondary | ICD-10-CM

## 2022-12-19 MED ORDER — DOXYCYCLINE HYCLATE 100 MG PO TABS: 100.0000 mg | ORAL_TABLET | Freq: Two times a day (BID) | ORAL | 0 refills | Status: AC

## 2022-12-19 NOTE — Patient Instructions (Signed)

## 2022-12-19 NOTE — Progress Notes (Signed)
DATE:  12/19/2022 MRN:  [id number]  BIRTHDAY: 1971/07/26   Contact Information     Name Relation Home Work Mobile   PAYNE,GWYNN Relative [phone]          Code Status History     Date Active Date Inactive Code Status Order ID Comments User Context   11/19/2022 1315 11/24/2022 2223 Full Code [id number]  Rodena Goldmann, DO ED   10/21/2022 2344 11/08/2022 2257 Full Code [id number]  Dibia, Manfred Shirts, MD ED   10/01/2022 0007 10/03/2022 2257 Full Code [id number]  Toy Baker, MD ED   08/19/2022 1520 08/20/2022 0402 Full Code [id number]  Blake Divine, MD ED   05/28/2022 1643 06/01/2022 2047 Full Code [id number]  Collier Bullock, MD ED   08/07/2021 2157 08/11/2021 1902 Full Code [id number]  Athena Masse, MD ED   12/19/2020 2354 12/23/2020 1816 Full Code [id number]  Athena Masse, MD ED   06/04/2020 1914 06/06/2020 1702 Full Code [id number]  Ivor Costa, MD ED   07/12/2019 1246 07/15/2019 1711 Full Code [id number]  Nicholes Mango, MD ED   05/25/2018 0819 05/27/2018 1553 Full Code [id number]  Arta Silence, MD Inpatient        Chief Complaint  Patient presents with   Hospitalization Follow-up    Patient presents today for a hospitalization follow-up. He was admitted into [facility] on 11/19/22-11/24/22 for chest pain.    HISTORY OF PRESENT ILLNESS: This is a 52 year old male who had a telephone visit today for ED visit follow up. He was seen in the ED on 12/12/22 for viral syndrome. He was complaining of generalized body aches and chest pains. COVID-19, flu and RSV test were negative. He was given IM Decadron and IM Toradol. EKG showed no acute ischemia. Chest x-ray was negative for focal infiltrate.  Today, during telephone visit, he said that he has coughed out black colored vomitus. He has greenish phlegm and nasal discharge. He complains of having sore throat and chills for 2 weeks now. He feels cold in his camper since his heater "cannot satisfy the camper".   PAST MEDICAL  HISTORY:  Past Medical History:  Diagnosis Date   CAD (coronary artery disease)    a. 01/2011 Anterior STEMI/Cath/PCI: LM nl, LAD 100d (3.5x46m Vision BMS placed), LCX 220mRI 50, RCA min irregs, EF 40% w/ apical, inferoapical HK.   Cardiac arrest - ventricular fibrillation    a. In setting of STEMI 01/2011   Chronic pain    Cocaine abuse ([facility]   Depression with anxiety    ETOH abuse    a. 6-12 beers/day   GERD (gastroesophageal reflux disease)    Hemorrhoids    Hyperlipidemia    Hypertension    Ischemic cardiomyopathy    a. 06/2011 Echo: EF 45-50%, No rwma   Marijuana abuse    Migraines    Tobacco abuse    a. 1/2 ppd x 26 yrs     CURRENT MEDICATIONS: Reviewed  Patient's Medications  New Prescriptions   No medications on file  Previous Medications   ACETAMINOPHEN (TYLENOL) 500 MG TABLET    Take 2 tablets (1,000 mg total) by mouth every 6 (six) hours as needed for mild pain.   ASPIRIN 81 MG CHEWABLE TABLET    Chew 1 tablet (81 mg total) by mouth daily.   CALCIUM-VITAMIN D (OSCAL WITH D) 500-5 MG-MCG TABLET    Take 1 tablet by mouth 2 (two) times daily.   CHLORDIAZEPOXIDE (LIBRIUM) 5 MG CAPSULE  Take 3 tablets by mouth twice a day x 2 days; then 2 tablets by mouth twice a day x 2 days; then 1 tablet by mouth 3 times a day x 2 days; then 1 tablet by mouth twice a day x 2 days; then 1 tablet by mouth daily x 3 days and stop Librium.   EZETIMIBE (ZETIA) 10 MG TABLET    Take 10 mg by mouth daily.   FERROUS SULFATE 325 (65 FE) MG TABLET    Take 1 tablet (325 mg total) by mouth daily with breakfast.   FOLIC ACID (FOLVITE) 1 MG TABLET    Take 1 tablet (1 mg total) by mouth daily.   FUROSEMIDE (LASIX) 40 MG TABLET    Take 1 tablet (40 mg total) by mouth daily.   GABAPENTIN (NEURONTIN) 300 MG CAPSULE    Take 1 capsule (300 mg total) by mouth 3 (three) times daily.   ISOSORBIDE MONONITRATE (IMDUR) 30 MG 24 HR TABLET    Take 0.5 tablets (15 mg total) by mouth daily.   LIDOCAINE  (XYLOCAINE) 5 % OINTMENT    Apply 1 Application topically 4 (four) times daily as needed. Apply to the perianal area   MAGNESIUM OXIDE (MAG-OX) 400 (240 MG) MG TABLET    Take 1 tablet (400 mg total) by mouth 2 (two) times daily.   MIDODRINE (PROAMATINE) 2.5 MG TABLET    Take 1 tablet (2.5 mg total) by mouth 3 (three) times daily with meals.   MULTIPLE VITAMINS-MINERALS (MULTIVITAMIN) TABLET    Take 1 tablet by mouth daily.   NICOTINE (NICODERM CQ - DOSED IN MG/24 HOURS) 14 MG/24HR PATCH    Place 1 patch (14 mg total) onto the skin daily.   NITROGLYCERIN (NITROSTAT) 0.4 MG SL TABLET    Place 1 tablet (0.4 mg total) under the tongue every 5 (five) minutes as needed for chest pain.   PANTOPRAZOLE (PROTONIX) 40 MG TABLET    Take 1 tablet (40 mg total) by mouth 2 (two) times daily.   POLYETHYLENE GLYCOL (MIRALAX / GLYCOLAX) 17 G PACKET    Take 17 g by mouth daily.   PSYLLIUM (HYDROCIL/METAMUCIL) 95 % PACK    Take 1 packet by mouth daily.   ROSUVASTATIN (CRESTOR) 40 MG TABLET    TAKE 1 TABLET BY MOUTH EVERY DAY   THIAMINE (VITAMIN B-1) 100 MG TABLET    Take 1 tablet (100 mg total) by mouth daily.  Modified Medications   No medications on file  Discontinued Medications   No medications on file     Allergies  Allergen Reactions   Penicillins Hives    Tolerated Ceftriaxone 10/2022   Other Rash    chives     REVIEW OF SYSTEMS:  GENERAL: has chills SKIN: Denies rash, itching, wounds, ulcer sores, or nail abnormality EYES: Denies change in vision, dry eyes, eye pain, itching or discharge EARS: Denies change in hearing, ringing in ears, or earache NOSE: has nasal congestion  MOUTH and THROAT: has sore throat    RESPIRATORY: + cough, no SOB CARDIAC: no chest pain, edema or palpitations GI: + abdominal pain and vomiting GU: Denies dysuria, frequency, hematuria, incontinence, or discharge MUSCULOSKELETAL: complains of back and leg pain NEUROLOGICAL: Denies dizziness, syncope, numbness, or  headache PSYCHIATRIC: Denies feeling of depression or anxiety. No report of hallucinations, insomnia, paranoia, or agitation    LABS/RADIOLOGY: Labs reviewed: Basic Metabolic Panel: Recent Labs    09/30/22 2225 10/01/22 0137 10/02/22 0440 10/03/22 0150 10/25/22 0339 10/26/22  0938 11/20/22 0451 11/21/22 0544 12/12/22 2050  NA 133* 129* 135   < > 138   < > 133* 136 137  K 3.6 3.5 3.7   < > 5.4*   < > 3.4* 3.7 3.5  CL 105 101 107   < > 112*   < > 101 105 106  CO2 22 20* 22   < > 23   < > '23 23 22  '$ GLUCOSE 91 116* 124*   < > 138*   < > 103* 137* 103*  BUN <5* <5* <5*   < > 9   < > <5* 5* 6  CREATININE 0.61 0.72 0.68   < > 0.68   < > 0.74 0.74 0.46*  CALCIUM 7.8* 7.5* 8.0*   < > 8.5*   < > 8.3* 8.1* 8.4*  MG 1.6* 1.6* 2.2   < > 2.2  --  1.5* 2.0  --   PHOS 2.1* 1.8* 2.9  --   --   --   --   --   --    < > = values in this interval not displayed.   Liver Function Tests: Recent Labs    10/23/22 0449 11/19/22 0716 11/20/22 0451  AST 63* 46* 41  ALT '17 29 22  '$ ALKPHOS 233* 229* 222*  BILITOT 1.9* 1.5* 2.3*  PROT 6.5 7.9 7.2  ALBUMIN 2.5* 3.4* 3.2*   Recent Labs    08/19/22 1253 09/30/22 1155 11/19/22 0716  LIPASE 29 40 38   Recent Labs    09/30/22 2225  AMMONIA 52*   CBC: Recent Labs    09/30/22 2225 10/01/22 0137 10/17/22 1546 10/21/22 2000 11/19/22 0716 11/20/22 0451 11/21/22 0544 12/12/22 2050  WBC 6.8   < > 14.0*   < > 8.2 6.4 4.3 7.9  NEUTROABS 4.8  --  10,486*  --  5.8  --   --   --   HGB 8.2*   < > 12.3*   < > 11.6* 9.9* 8.9* 11.1*  HCT 24.7*   < > 36.4*   < > 34.8* 31.3* 27.6* 33.9*  MCV 93.6   < > 91.0   < > 96.1 97.5 100.0 97.1  PLT 175   < > 356   < > 161 127* 103* 142*   < > = values in this interval not displayed.   A1C: Invalid input(s): "A1C" Lipid Panel: Recent Labs    09/23/22 1034 09/30/22 2225  HDL 13* 20*   Cardiac Enzymes: Recent Labs    09/30/22 2225  CKTOTAL 97   BNP: Invalid input(s): "POCBNP" CBG: Recent Labs     10/03/22 1609 10/29/22 1102 10/29/22 1137  GLUCAP 171* 179* 177*      DG Chest 2 View  Result Date: 12/12/2022 CLINICAL DATA:  Chest pain, chills, body aches EXAM: CHEST - 2 VIEW COMPARISON:  11/19/2022 FINDINGS: Frontal and lateral views of the chest demonstrate a stable cardiac silhouette. No acute airspace disease, effusion, or pneumothorax. No acute bony abnormality. IMPRESSION: 1. Stable chest, no acute process. Electronically Signed   By: Randa Ngo M.D.   On: 12/12/2022 20:47    ASSESSMENT/PLAN:  1. URI with cough and congestion - doxycycline (VIBRA-TABS) 100 MG tablet; Take 1 tablet (100 mg total) by mouth 2 (two) times daily for 7 days.  Dispense: 14 tablet; Refill: 0 -  instructed to take drink fluids for hydration -  instructed to do steam inhalation TID    Time spent on  non face to face visit:  12 minutes telephone visit  The patient gave consent to this telephone visit. Explained to the patient the risk and privacy issue that was involved with this telephone call.   The patient was advised to call back and ask for an in-person evaluation if the symptoms worsen or if the condition fails to improve.   Durenda Age, NP [employer] [phone]

## 2022-12-21 ENCOUNTER — Other Ambulatory Visit: Payer: Self-pay | Admitting: Adult Health

## 2022-12-21 DIAGNOSIS — Z8719 Personal history of other diseases of the digestive system: Secondary | ICD-10-CM

## 2022-12-21 DIAGNOSIS — M62838 Other muscle spasm: Secondary | ICD-10-CM

## 2023-01-06 ENCOUNTER — Encounter: Payer: Self-pay | Admitting: Adult Health

## 2023-01-06 ENCOUNTER — Telehealth (INDEPENDENT_AMBULATORY_CARE_PROVIDER_SITE_OTHER): Payer: Medicare HMO | Admitting: Adult Health

## 2023-01-06 VITALS — Temp 98.0°F | Ht 65.0 in | Wt 200.0 lb

## 2023-01-06 DIAGNOSIS — I5032 Chronic diastolic (congestive) heart failure: Secondary | ICD-10-CM | POA: Diagnosis not present

## 2023-01-06 DIAGNOSIS — M79604 Pain in right leg: Secondary | ICD-10-CM | POA: Diagnosis not present

## 2023-01-06 DIAGNOSIS — R059 Cough, unspecified: Secondary | ICD-10-CM

## 2023-01-06 DIAGNOSIS — M79605 Pain in left leg: Secondary | ICD-10-CM | POA: Diagnosis not present

## 2023-01-06 DIAGNOSIS — I25118 Atherosclerotic heart disease of native coronary artery with other forms of angina pectoris: Secondary | ICD-10-CM | POA: Diagnosis not present

## 2023-01-06 DIAGNOSIS — I959 Hypotension, unspecified: Secondary | ICD-10-CM | POA: Diagnosis not present

## 2023-01-06 NOTE — Progress Notes (Signed)
DATE:  01/06/2023 MRN:  [id number]  BIRTHDAY: [birth year]   Contact Information     Name Relation Home Work Mobile   PAYNE,GWYNN Relative [phone]          Code Status History     Date Active Date Inactive Code Status Order ID Comments User Context   11/19/2022 1315 11/24/2022 2223 Full Code [id number]  Rodena Goldmann, DO ED   10/21/2022 2344 11/08/2022 2257 Full Code [id number]  Dibia, Manfred Shirts, MD ED   10/01/2022 0007 10/03/2022 2257 Full Code [id number]  Toy Baker, MD ED   08/19/2022 1520 08/20/2022 0402 Full Code [id number]  Blake Divine, MD ED   05/28/2022 1643 06/01/2022 2047 Full Code [id number]  Collier Bullock, MD ED   08/07/2021 2157 08/11/2021 1902 Full Code [id number]  Athena Masse, MD ED   12/19/2020 2354 12/23/2020 1816 Full Code [id number]  Athena Masse, MD ED   06/04/2020 1914 06/06/2020 1702 Full Code [id number]  Ivor Costa, MD ED   07/12/2019 1246 07/15/2019 1711 Full Code [id number]  Nicholes Mango, MD ED   05/25/2018 0819 05/27/2018 1553 Full Code [id number]  Arta Silence, MD Inpatient        Chief Complaint  Patient presents with   Acute Visit    Edema of lower extremities - roughly a week but continuing to get bigger Left leg is unbearable and right leg is getting almost as bad Patient states he is coughing up blood and still has blood in stool from hemorrhoids  Call patient on phone says video does not work   Provider location:  [facility]  Patient location:  Home   HISTORY OF PRESENT ILLNESS: This is a 52 year old male who had a telephone visit for lower extremity pain and edema. He rated his pain 8/10. He stated that he has BLE edema with Left > Right. He takes Neurontin 300 mg TID for neuropathy. He takes Furosemide for lower extremity edema/chronic diastolic heart failure. He does not check his BP since his "BP monitor is in the storage and might not have a battery." He takes Midodrine 2.5 mg TID for hypotension. He stated  that the blood in his phlegm has gotten better, less now. He moves his bowels regularly since he takes Miralax. He denies having blood in his stool.   PAST MEDICAL HISTORY:  Past Medical History:  Diagnosis Date   CAD (coronary artery disease)    a. 01/2011 Anterior STEMI/Cath/PCI: LM nl, LAD 100d (3.5x46m Vision BMS placed), LCX 223mRI 50, RCA min irregs, EF 40% w/ apical, inferoapical HK.   Cardiac arrest - ventricular fibrillation    a. In setting of STEMI 01/2011   Chronic pain    Cocaine abuse ([facility]   Depression with anxiety    ETOH abuse    a. 6-12 beers/day   GERD (gastroesophageal reflux disease)    Hemorrhoids    Hyperlipidemia    Hypertension    Ischemic cardiomyopathy    a. 06/2011 Echo: EF 45-50%, No rwma   Marijuana abuse    Migraines    Tobacco abuse    a. 1/2 ppd x 26 yrs     CURRENT MEDICATIONS: Reviewed  Patient's Medications  New Prescriptions   No medications on file  Previous Medications   ACETAMINOPHEN (TYLENOL) 500 MG TABLET    Take 2 tablets (1,000 mg total) by mouth every 6 (six) hours as needed for mild pain.   ASPIRIN 81 MG CHEWABLE TABLET  Chew 1 tablet (81 mg total) by mouth daily.   CALCIUM-VITAMIN D (OSCAL WITH D) 500-5 MG-MCG TABLET    Take 1 tablet by mouth 2 (two) times daily.   CHLORDIAZEPOXIDE (LIBRIUM) 5 MG CAPSULE    Take 3 tablets by mouth twice a day x 2 days; then 2 tablets by mouth twice a day x 2 days; then 1 tablet by mouth 3 times a day x 2 days; then 1 tablet by mouth twice a day x 2 days; then 1 tablet by mouth daily x 3 days and stop Librium.   EZETIMIBE (ZETIA) 10 MG TABLET    Take 10 mg by mouth daily.   FERROUS SULFATE 325 (65 FE) MG TABLET    Take 1 tablet (325 mg total) by mouth daily with breakfast.   FOLIC ACID (FOLVITE) 1 MG TABLET    Take 1 tablet (1 mg total) by mouth daily.   FUROSEMIDE (LASIX) 40 MG TABLET    Take 1 tablet (40 mg total) by mouth daily.   GABAPENTIN (NEURONTIN) 300 MG CAPSULE    Take 1 capsule (300  mg total) by mouth 3 (three) times daily.   ISOSORBIDE MONONITRATE (IMDUR) 30 MG 24 HR TABLET    Take 0.5 tablets (15 mg total) by mouth daily.   LIDOCAINE (XYLOCAINE) 5 % OINTMENT    Apply 1 Application topically 4 (four) times daily as needed. Apply to the perianal area   MAGNESIUM OXIDE (MAG-OX) 400 (240 MG) MG TABLET    Take 1 tablet (400 mg total) by mouth 2 (two) times daily.   METHOCARBAMOL (ROBAXIN) 750 MG TABLET    TAKE 1 TABLET (750 MG TOTAL) BY MOUTH DAILY AS NEEDED FOR MUSCLE SPASMS   MIDODRINE (PROAMATINE) 2.5 MG TABLET    Take 1 tablet (2.5 mg total) by mouth 3 (three) times daily with meals.   MULTIPLE VITAMINS-MINERALS (MULTIVITAMIN) TABLET    Take 1 tablet by mouth daily.   NICOTINE (NICODERM CQ - DOSED IN MG/24 HOURS) 14 MG/24HR PATCH    Place 1 patch (14 mg total) onto the skin daily.   NITROGLYCERIN (NITROSTAT) 0.4 MG SL TABLET    Place 1 tablet (0.4 mg total) under the tongue every 5 (five) minutes as needed for chest pain.   PANTOPRAZOLE (PROTONIX) 40 MG TABLET    TAKE 1 TABLET BY MOUTH EVERY DAY   POLYETHYLENE GLYCOL (MIRALAX / GLYCOLAX) 17 G PACKET    Take 17 g by mouth daily.   PSYLLIUM (HYDROCIL/METAMUCIL) 95 % PACK    Take 1 packet by mouth daily.   ROSUVASTATIN (CRESTOR) 40 MG TABLET    TAKE 1 TABLET BY MOUTH EVERY DAY   THIAMINE (VITAMIN B-1) 100 MG TABLET    Take 1 tablet (100 mg total) by mouth daily.  Modified Medications   No medications on file  Discontinued Medications   No medications on file     Allergies  Allergen Reactions   Penicillins Hives    Tolerated Ceftriaxone 10/2022   Other Rash    chives     REVIEW OF SYSTEMS:  GENERAL: no change in appetite, no fatigue, no weight changes, no fever, chills or weakness SKIN: Denies rash, itching, wounds, ulcer sores, or nail abnormality EYES: Denies change in vision, dry eyes, eye pain, itching or discharge EARS: Denies change in hearing, ringing in ears, or earache NOSE: Denies nasal congestion or  epistaxis MOUTH and THROAT: Denies oral discomfort, gingival pain or bleeding, pain from teeth or hoarseness  RESPIRATORY: has cough, no SOB CARDIAC: no chest pain, has lower extremity edema  GI: no abdominal pain, diarrhea, constipation, heart burn, nausea or vomiting GU: Denies dysuria, frequency, hematuria, incontinence, or discharge MUSCULOSKELETAL: Pain in lower extremities, 8/10 CIRCULATION: lower extremity edema NEUROLOGICAL: Denies dizziness, syncope, numbness, or headache PSYCHIATRIC: Denies feeling of depression or anxiety. No report of hallucinations, insomnia, paranoia, or agitation     LABS/RADIOLOGY: Labs reviewed: Basic Metabolic Panel: Recent Labs    09/30/22 2225 10/01/22 0137 10/02/22 0440 10/03/22 0150 10/25/22 0339 10/26/22 0620 11/20/22 0451 11/21/22 0544 12/12/22 2050  NA 133* 129* 135   < > 138   < > 133* 136 137  K 3.6 3.5 3.7   < > 5.4*   < > 3.4* 3.7 3.5  CL 105 101 107   < > 112*   < > 101 105 106  CO2 22 20* 22   < > 23   < > '23 23 22  '$ GLUCOSE 91 116* 124*   < > 138*   < > 103* 137* 103*  BUN <5* <5* <5*   < > 9   < > <5* 5* 6  CREATININE 0.61 0.72 0.68   < > 0.68   < > 0.74 0.74 0.46*  CALCIUM 7.8* 7.5* 8.0*   < > 8.5*   < > 8.3* 8.1* 8.4*  MG 1.6* 1.6* 2.2   < > 2.2  --  1.5* 2.0  --   PHOS 2.1* 1.8* 2.9  --   --   --   --   --   --    < > = values in this interval not displayed.   Liver Function Tests: Recent Labs    10/23/22 0449 11/19/22 0716 11/20/22 0451  AST 63* 46* 41  ALT '17 29 22  '$ ALKPHOS 233* 229* 222*  BILITOT 1.9* 1.5* 2.3*  PROT 6.5 7.9 7.2  ALBUMIN 2.5* 3.4* 3.2*   Recent Labs    08/19/22 1253 09/30/22 1155 11/19/22 0716  LIPASE 29 40 38   Recent Labs    09/30/22 2225  AMMONIA 52*   CBC: Recent Labs    09/30/22 2225 10/01/22 0137 10/17/22 1546 10/21/22 2000 11/19/22 0716 11/20/22 0451 11/21/22 0544 12/12/22 2050  WBC 6.8   < > 14.0*   < > 8.2 6.4 4.3 7.9  NEUTROABS 4.8  --  10,486*  --  5.8  --    --   --   HGB 8.2*   < > 12.3*   < > 11.6* 9.9* 8.9* 11.1*  HCT 24.7*   < > 36.4*   < > 34.8* 31.3* 27.6* 33.9*  MCV 93.6   < > 91.0   < > 96.1 97.5 100.0 97.1  PLT 175   < > 356   < > 161 127* 103* 142*   < > = values in this interval not displayed.   A1C: Invalid input(s): "A1C" Lipid Panel: Recent Labs    09/23/22 1034 09/30/22 2225  HDL 13* 20*   Cardiac Enzymes: Recent Labs    09/30/22 2225  CKTOTAL 97   BNP: Invalid input(s): "POCBNP" CBG: Recent Labs    10/03/22 1609 10/29/22 1102 10/29/22 1137  GLUCAP 171* 179* 177*      DG Chest 2 View  Result Date: 12/12/2022 CLINICAL DATA:  Chest pain, chills, body aches EXAM: CHEST - 2 VIEW COMPARISON:  11/19/2022 FINDINGS: Frontal and lateral views of the chest demonstrate a stable cardiac silhouette. No acute  airspace disease, effusion, or pneumothorax. No acute bony abnormality. IMPRESSION: 1. Stable chest, no acute process. Electronically Signed   By: Randa Ngo M.D.   On: 12/12/2022 20:47    ASSESSMENT/PLAN:  1. Pain in both lower extremities -  instructed to apply heating pad TID and Biofreeze gel TID - VAS [place] LOWER EXTREMITY VENOUS (DVT)  2. Chronic diastolic heart failure (HCC) -  no SOB -  continue Lasix  3. Coronary artery disease of native artery of native heart with stable angina pectoris ([place]) -  denies chest pain -  continue NTG PRN, IMDUR and Rosuvastatin  4. Hypotension, unspecified hypotension type -  instructed to check BP daily, log and bring to next appointment -  continue Midodrine  5. Cough -  take Mucinex 600 mg BID X 7 days   Time spent on non face to face visit:  20 minutes  The patient gave consent to this telephone visit. Explained to the patient the risk and privacy issue that was involved with this telephone call.   The patient was advised to call back and ask for an in-person evaluation if the symptoms worsen or if the condition fails to improve.   Durenda Age,  NP [employer] [phone]

## 2023-01-16 ENCOUNTER — Emergency Department: Payer: Medicare HMO

## 2023-01-16 ENCOUNTER — Inpatient Hospital Stay
Admission: EM | Admit: 2023-01-16 | Discharge: 2023-01-31 | DRG: 393 | Disposition: A | Discharge status: Home / Self Care | Payer: Medicare HMO | Attending: Internal Medicine | Admitting: Internal Medicine

## 2023-01-16 DIAGNOSIS — K641 Second degree hemorrhoids: Principal | ICD-10-CM | POA: Diagnosis present

## 2023-01-16 DIAGNOSIS — K7030 Alcoholic cirrhosis of liver without ascites: Secondary | ICD-10-CM | POA: Diagnosis present

## 2023-01-16 DIAGNOSIS — R609 Edema, unspecified: Secondary | ICD-10-CM

## 2023-01-16 DIAGNOSIS — E871 Hypo-osmolality and hyponatremia: Secondary | ICD-10-CM | POA: Diagnosis present

## 2023-01-16 DIAGNOSIS — E669 Obesity, unspecified: Secondary | ICD-10-CM | POA: Diagnosis present

## 2023-01-16 DIAGNOSIS — F32A Depression, unspecified: Secondary | ICD-10-CM | POA: Diagnosis present

## 2023-01-16 DIAGNOSIS — Z6838 Body mass index (BMI) 38.0-38.9, adult: Secondary | ICD-10-CM | POA: Diagnosis not present

## 2023-01-16 DIAGNOSIS — R2240 Localized swelling, mass and lump, unspecified lower limb: Secondary | ICD-10-CM | POA: Diagnosis not present

## 2023-01-16 DIAGNOSIS — E876 Hypokalemia: Secondary | ICD-10-CM | POA: Diagnosis not present

## 2023-01-16 DIAGNOSIS — I493 Ventricular premature depolarization: Secondary | ICD-10-CM | POA: Diagnosis not present

## 2023-01-16 DIAGNOSIS — K648 Other hemorrhoids: Secondary | ICD-10-CM | POA: Diagnosis not present

## 2023-01-16 DIAGNOSIS — F1020 Alcohol dependence, uncomplicated: Secondary | ICD-10-CM | POA: Diagnosis present

## 2023-01-16 DIAGNOSIS — F419 Anxiety disorder, unspecified: Secondary | ICD-10-CM | POA: Diagnosis present

## 2023-01-16 DIAGNOSIS — N179 Acute kidney failure, unspecified: Secondary | ICD-10-CM | POA: Diagnosis present

## 2023-01-16 DIAGNOSIS — Z91018 Allergy to other foods: Secondary | ICD-10-CM

## 2023-01-16 DIAGNOSIS — F1029 Alcohol dependence with unspecified alcohol-induced disorder: Secondary | ICD-10-CM | POA: Diagnosis not present

## 2023-01-16 DIAGNOSIS — R578 Other shock: Secondary | ICD-10-CM | POA: Diagnosis present

## 2023-01-16 DIAGNOSIS — K5909 Other constipation: Secondary | ICD-10-CM | POA: Diagnosis present

## 2023-01-16 DIAGNOSIS — F1090 Alcohol use, unspecified, uncomplicated: Secondary | ICD-10-CM | POA: Diagnosis present

## 2023-01-16 DIAGNOSIS — D62 Acute posthemorrhagic anemia: Secondary | ICD-10-CM | POA: Diagnosis present

## 2023-01-16 DIAGNOSIS — Z880 Allergy status to penicillin: Secondary | ICD-10-CM

## 2023-01-16 DIAGNOSIS — F1420 Cocaine dependence, uncomplicated: Secondary | ICD-10-CM | POA: Diagnosis present

## 2023-01-16 DIAGNOSIS — F1429 Cocaine dependence with unspecified cocaine-induced disorder: Secondary | ICD-10-CM | POA: Diagnosis present

## 2023-01-16 DIAGNOSIS — I774 Celiac artery compression syndrome: Secondary | ICD-10-CM | POA: Diagnosis not present

## 2023-01-16 DIAGNOSIS — E785 Hyperlipidemia, unspecified: Secondary | ICD-10-CM | POA: Diagnosis present

## 2023-01-16 DIAGNOSIS — D6959 Other secondary thrombocytopenia: Secondary | ICD-10-CM | POA: Diagnosis not present

## 2023-01-16 DIAGNOSIS — I110 Hypertensive heart disease with heart failure: Secondary | ICD-10-CM | POA: Diagnosis present

## 2023-01-16 DIAGNOSIS — I5A Non-ischemic myocardial injury (non-traumatic): Secondary | ICD-10-CM | POA: Insufficient documentation

## 2023-01-16 DIAGNOSIS — Z720 Tobacco use: Secondary | ICD-10-CM | POA: Diagnosis present

## 2023-01-16 DIAGNOSIS — Z95828 Presence of other vascular implants and grafts: Secondary | ICD-10-CM

## 2023-01-16 DIAGNOSIS — I5033 Acute on chronic diastolic (congestive) heart failure: Secondary | ICD-10-CM | POA: Diagnosis present

## 2023-01-16 DIAGNOSIS — Z811 Family history of alcohol abuse and dependence: Secondary | ICD-10-CM

## 2023-01-16 DIAGNOSIS — E872 Acidosis, unspecified: Secondary | ICD-10-CM | POA: Diagnosis present

## 2023-01-16 DIAGNOSIS — E271 Primary adrenocortical insufficiency: Secondary | ICD-10-CM | POA: Diagnosis not present

## 2023-01-16 DIAGNOSIS — I2510 Atherosclerotic heart disease of native coronary artery without angina pectoris: Secondary | ICD-10-CM | POA: Diagnosis present

## 2023-01-16 DIAGNOSIS — I959 Hypotension, unspecified: Secondary | ICD-10-CM | POA: Diagnosis not present

## 2023-01-16 DIAGNOSIS — R0602 Shortness of breath: Secondary | ICD-10-CM | POA: Diagnosis not present

## 2023-01-16 DIAGNOSIS — Z1152 Encounter for screening for COVID-19: Secondary | ICD-10-CM | POA: Diagnosis not present

## 2023-01-16 DIAGNOSIS — I9589 Other hypotension: Secondary | ICD-10-CM | POA: Diagnosis not present

## 2023-01-16 DIAGNOSIS — K551 Chronic vascular disorders of intestine: Secondary | ICD-10-CM | POA: Diagnosis present

## 2023-01-16 DIAGNOSIS — R109 Unspecified abdominal pain: Secondary | ICD-10-CM | POA: Diagnosis not present

## 2023-01-16 DIAGNOSIS — K766 Portal hypertension: Secondary | ICD-10-CM | POA: Diagnosis present

## 2023-01-16 DIAGNOSIS — K625 Hemorrhage of anus and rectum: Secondary | ICD-10-CM | POA: Diagnosis present

## 2023-01-16 DIAGNOSIS — I214 Non-ST elevation (NSTEMI) myocardial infarction: Principal | ICD-10-CM | POA: Diagnosis present

## 2023-01-16 DIAGNOSIS — T501X6A Underdosing of loop [high-ceiling] diuretics, initial encounter: Secondary | ICD-10-CM | POA: Diagnosis present

## 2023-01-16 DIAGNOSIS — Z8249 Family history of ischemic heart disease and other diseases of the circulatory system: Secondary | ICD-10-CM

## 2023-01-16 DIAGNOSIS — M79605 Pain in left leg: Secondary | ICD-10-CM | POA: Diagnosis not present

## 2023-01-16 DIAGNOSIS — K921 Melena: Secondary | ICD-10-CM | POA: Diagnosis present

## 2023-01-16 DIAGNOSIS — F17210 Nicotine dependence, cigarettes, uncomplicated: Secondary | ICD-10-CM | POA: Diagnosis present

## 2023-01-16 DIAGNOSIS — Z91148 Patient's other noncompliance with medication regimen for other reason: Secondary | ICD-10-CM

## 2023-01-16 DIAGNOSIS — R051 Acute cough: Secondary | ICD-10-CM | POA: Diagnosis not present

## 2023-01-16 DIAGNOSIS — D75838 Other thrombocytosis: Secondary | ICD-10-CM | POA: Diagnosis not present

## 2023-01-16 DIAGNOSIS — I25119 Atherosclerotic heart disease of native coronary artery with unspecified angina pectoris: Secondary | ICD-10-CM | POA: Diagnosis present

## 2023-01-16 DIAGNOSIS — Z86010 Personal history of colonic polyps: Secondary | ICD-10-CM

## 2023-01-16 DIAGNOSIS — E8770 Fluid overload, unspecified: Secondary | ICD-10-CM | POA: Diagnosis not present

## 2023-01-16 DIAGNOSIS — T50916A Underdosing of multiple unspecified drugs, medicaments and biological substances, initial encounter: Secondary | ICD-10-CM | POA: Diagnosis present

## 2023-01-16 DIAGNOSIS — I951 Orthostatic hypotension: Secondary | ICD-10-CM | POA: Diagnosis present

## 2023-01-16 DIAGNOSIS — R600 Localized edema: Secondary | ICD-10-CM | POA: Diagnosis present

## 2023-01-16 DIAGNOSIS — I2489 Other forms of acute ischemic heart disease: Secondary | ICD-10-CM

## 2023-01-16 DIAGNOSIS — F1210 Cannabis abuse, uncomplicated: Secondary | ICD-10-CM | POA: Diagnosis present

## 2023-01-16 DIAGNOSIS — I10 Essential (primary) hypertension: Secondary | ICD-10-CM | POA: Diagnosis present

## 2023-01-16 DIAGNOSIS — I252 Old myocardial infarction: Secondary | ICD-10-CM

## 2023-01-16 DIAGNOSIS — K2980 Duodenitis without bleeding: Secondary | ICD-10-CM | POA: Diagnosis present

## 2023-01-16 DIAGNOSIS — R778 Other specified abnormalities of plasma proteins: Secondary | ICD-10-CM | POA: Diagnosis not present

## 2023-01-16 DIAGNOSIS — Z7982 Long term (current) use of aspirin: Secondary | ICD-10-CM

## 2023-01-16 DIAGNOSIS — J9601 Acute respiratory failure with hypoxia: Secondary | ICD-10-CM | POA: Diagnosis not present

## 2023-01-16 DIAGNOSIS — R601 Generalized edema: Secondary | ICD-10-CM | POA: Diagnosis present

## 2023-01-16 DIAGNOSIS — K219 Gastro-esophageal reflux disease without esophagitis: Secondary | ICD-10-CM | POA: Diagnosis present

## 2023-01-16 DIAGNOSIS — E860 Dehydration: Secondary | ICD-10-CM | POA: Diagnosis present

## 2023-01-16 DIAGNOSIS — R739 Hyperglycemia, unspecified: Secondary | ICD-10-CM | POA: Diagnosis not present

## 2023-01-16 DIAGNOSIS — G894 Chronic pain syndrome: Secondary | ICD-10-CM | POA: Diagnosis present

## 2023-01-16 DIAGNOSIS — F1410 Cocaine abuse, uncomplicated: Secondary | ICD-10-CM | POA: Diagnosis not present

## 2023-01-16 DIAGNOSIS — Z79899 Other long term (current) drug therapy: Secondary | ICD-10-CM

## 2023-01-16 DIAGNOSIS — Z87898 Personal history of other specified conditions: Secondary | ICD-10-CM

## 2023-01-16 DIAGNOSIS — R000 Tachycardia, unspecified: Secondary | ICD-10-CM | POA: Diagnosis present

## 2023-01-16 DIAGNOSIS — D500 Iron deficiency anemia secondary to blood loss (chronic): Secondary | ICD-10-CM | POA: Diagnosis present

## 2023-01-16 DIAGNOSIS — Z955 Presence of coronary angioplasty implant and graft: Secondary | ICD-10-CM

## 2023-01-16 DIAGNOSIS — K2270 Barrett's esophagus without dysplasia: Secondary | ICD-10-CM | POA: Diagnosis present

## 2023-01-16 DIAGNOSIS — D649 Anemia, unspecified: Secondary | ICD-10-CM | POA: Diagnosis not present

## 2023-01-16 DIAGNOSIS — M79604 Pain in right leg: Secondary | ICD-10-CM | POA: Diagnosis not present

## 2023-01-16 DIAGNOSIS — K7460 Unspecified cirrhosis of liver: Secondary | ICD-10-CM | POA: Diagnosis not present

## 2023-01-16 DIAGNOSIS — I255 Ischemic cardiomyopathy: Secondary | ICD-10-CM | POA: Diagnosis present

## 2023-01-16 HISTORY — DX: Gastrointestinal hemorrhage, unspecified: K92.2

## 2023-01-16 MED ORDER — KETOROLAC TROMETHAMINE 30 MG/ML IJ SOLN: 15.0000 mg | Freq: Once | INTRAMUSCULAR | Status: DC

## 2023-01-16 NOTE — ED Notes (Signed)
ED Provider at bedside. 

## 2023-01-16 NOTE — ED Provider Notes (Signed)
[facility] Provider Note    Event Date/Time   First MD Initiated Contact with Patient 01/16/23 2321     (approximate)   History   Leg Swelling   HPI Level 5 caveat:  history/ROS limited by acute/critical illness  Ethan Wells is a 52 y.o. male with extensive medical history that includes substance abuse (cocaine), cardiomyopathy with preserved ejection fraction, CHF, alcohol dependence, history of acute blood loss and hemorrhagic shock secondary to hemorrhoidal bleed, history of chest pain and demand ischemia, depression, multiple electrolyte abnormalities, etc.  The patient presents tonight for evaluation of swelling in his legs.  He said that he feels like his legs been swelling more more over the last 3 weeks and has become painful.  He also reports pain all over his body and separately identifies pain in his left leg, right leg, right arm, right elbow, left arm, both shoulders, and his back, but identifies all them as different kinds of pain.  He has had no recent injuries.  He denies fever.  He denies chest pain.  Some shortness of breath but no different than baseline.  No significant abdominal pain.  He had some vomiting but it was a couple of days ago.  He reports a small amount of alcohol consumption today.  He adamantly denies any recent drug use and said that he will not have any cocaine in his system because he stopped the last time he was hospitalized which was 1 to 2 months ago.  He claims that every time he has a bowel movement he has bright red blood in his stool and coming from his rectum.  He feels generally weak and occasionally lightheaded and dizzy.  He for states that he has been taking his medicines, and then he said he actually has not been taking his medicines including his furosemide until about a week ago, and then he started taking it again.     Physical Exam   Triage Vital Signs: ED Triage Vitals  Enc Vitals Group     BP  01/16/23 2302 (!) 83/52     Pulse Rate 01/16/23 2302 98     Resp 01/16/23 2302 18     Temp 01/16/23 2302 98.5 F (36.9 C)     Temp Source 01/16/23 2302 Oral     SpO2 01/16/23 2302 100 %     Weight 01/16/23 2305 91.2 kg (201 lb 1 oz)     Height 01/16/23 2305 1.651 m ('5\' 5"'$ )     Head Circumference --      Peak Flow --      Pain Score 01/16/23 2305 6     Pain Loc --      Pain Edu? --      Excl. in [place]? --     Most recent vital signs: Vitals:   01/17/23 0200 01/17/23 0300  BP: 91/71 (!) 87/50  Pulse: 97 91  Resp: 19   Temp:    SpO2: 100% 100%     General: Awake, disheveled but not in significant distress.  Ill but nontoxic-appearing. CV:  Normal heart sounds, regular rate and rhythm. Resp:  Normal effort. Speaking easily and comfortably, no accessory muscle usage nor intercostal retractions.  Lungs are clear to auscultation bilaterally. Abd:  No distention.  No guarding or rebound, no tenderness to palpation. Other:  Rectal exam is uncomfortable for the patient, light brown stool, no gross blood, but strongly heme positive on the Hemoccult card.  He is  not reporting any suicidal ideation or homicidal ideation but eventually he admitted to ongoing drug and alcohol abuse.   ED Results / Procedures / Treatments   Labs (all labs ordered are listed, but only abnormal results are displayed) Labs Reviewed  BRAIN NATRIURETIC PEPTIDE - Abnormal; Notable for the following components:      Result Value   B Natriuretic Peptide 1,052.4 (*)    All other components within normal limits  COMPREHENSIVE METABOLIC PANEL - Abnormal; Notable for the following components:   Sodium 128 (*)    Potassium 2.8 (*)    Chloride 94 (*)    CO2 20 (*)    Glucose, Bld 109 (*)    Creatinine, Ser 1.45 (*)    Calcium 7.6 (*)    Albumin 2.6 (*)    AST 99 (*)    Alkaline Phosphatase 271 (*)    Total Bilirubin 1.5 (*)    GFR, Estimated 59 (*)    All other components within normal limits  CBC WITH  DIFFERENTIAL/PLATELET - Abnormal; Notable for the following components:   RBC 2.26 (*)    Hemoglobin 6.3 (*)    HCT 20.0 (*)    RDW 18.5 (*)    All other components within normal limits  URINALYSIS, ROUTINE W REFLEX MICROSCOPIC - Abnormal; Notable for the following components:   Color, Urine AMBER (*)    APPearance HAZY (*)    Protein, ur 30 (*)    All other components within normal limits  URINE DRUG SCREEN, QUALITATIVE ([facility] ONLY) - Abnormal; Notable for the following components:   Cocaine Metabolite,Ur Green POSITIVE (*)    Benzodiazepine, Ur Scrn POSITIVE (*)    All other components within normal limits  LACTIC ACID, PLASMA - Abnormal; Notable for the following components:   Lactic Acid, Venous 5.1 (*)    All other components within normal limits  LACTIC ACID, PLASMA - Abnormal; Notable for the following components:   Lactic Acid, Venous 3.2 (*)    All other components within normal limits  PROTIME-INR - Abnormal; Notable for the following components:   Prothrombin Time 16.0 (*)    INR 1.3 (*)    All other components within normal limits  APTT - Abnormal; Notable for the following components:   aPTT 37 (*)    All other components within normal limits  LIPASE, BLOOD - Abnormal; Notable for the following components:   Lipase 78 (*)    All other components within normal limits  TROPONIN I (HIGH SENSITIVITY) - Abnormal; Notable for the following components:   Troponin I (High Sensitivity) 431 (*)    All other components within normal limits  TROPONIN I (HIGH SENSITIVITY) - Abnormal; Notable for the following components:   Troponin I (High Sensitivity) 436 (*)    All other components within normal limits  CULTURE, BLOOD (ROUTINE X 2)  CULTURE, BLOOD (ROUTINE X 2)  MAGNESIUM  TYPE AND SCREEN  PREPARE RBC (CROSSMATCH)  TYPE AND SCREEN  PREPARE RBC (CROSSMATCH)     EKG  ED ECG REPORT I, Hinda Kehr, the attending physician, personally viewed and interpreted this  ECG.  Date: 01/16/2023 EKG Time: 23: 16 Rate: 101 Rhythm: Borderline sinus tachycardia QRS Axis: normal Intervals: normal ST/T Wave abnormalities: normal Narrative Interpretation: no evidence of acute ischemia    RADIOLOGY I personally viewed and interpreted the patient's chest x-ray as well as his CTA abdomen.  I also consulted with the radiologist who called me about the CT abdomen results as  shown below in the hospital course.  No acute abnormalities including pulmonary edema on chest x-ray.  Abnormalities as listed below in the hospital course on the CTA (mild blush suggesting active bleed in the rectum.)    PROCEDURES:  Critical Care performed: Yes, see critical care procedure note(s)  .1-3 Lead EKG Interpretation  Performed by: Hinda Kehr, MD Authorized by: Hinda Kehr, MD     Interpretation: abnormal     ECG rate:  101   ECG rate assessment: tachycardic     Rhythm: sinus tachycardia     Ectopy: none     Conduction: normal   .Critical Care  Performed by: Hinda Kehr, MD Authorized by: Hinda Kehr, MD   Critical care provider statement:    Critical care time (minutes):  60   Critical care time was exclusive of:  Separately billable procedures and treating other patients   Critical care was necessary to treat or prevent imminent or life-threatening deterioration of the following conditions:  Shock, renal failure and circulatory failure   Critical care was time spent personally by me on the following activities:  Development of treatment plan with patient or surrogate, evaluation of patient's response to treatment, examination of patient, obtaining history from patient or surrogate, ordering and performing treatments and interventions, ordering and review of laboratory studies, ordering and review of radiographic studies, pulse oximetry, re-evaluation of patient's condition and review of old charts    MEDICATIONS ORDERED IN ED: Medications  0.9 %  sodium  chloride infusion (Manually program via Guardrails IV Fluids) (has no administration in time range)  acetaminophen (TYLENOL) tablet 1,000 mg (1,000 mg Oral Given 01/17/23 0013)  sodium chloride 0.9 % bolus 500 mL (0 mLs Intravenous Stopped 01/17/23 0107)  0.9 %  sodium chloride infusion (10 mL/hr Intravenous New Bag/Given 01/17/23 0202)  potassium chloride SA (KLOR-CON M) CR tablet 40 mEq (40 mEq Oral Given 01/17/23 0201)  thiamine (VITAMIN B1) injection 100 mg (100 mg Intravenous Given 01/17/23 0201)  magnesium sulfate IVPB 2 g 50 mL (0 g Intravenous Stopped 01/17/23 0256)  iohexol (OMNIPAQUE) 350 MG/ML injection 100 mL (100 mLs Intravenous Contrast Given 01/17/23 0224)     IMPRESSION / MDM / [place] / ED COURSE  I reviewed the triage vital signs and the nursing notes.                              Differential diagnosis includes, but is not limited to, hemorrhagic shock, septic shock, polysubstance abuse, electrolyte or metabolic abnormality, renal failure  Patient's presentation is most consistent with acute presentation with potential threat to life or bodily function.  Patient presents borderline tachycardic but hypotensive.  He is afebrile and having no respiratory or thoracic symptoms (no chest pain).  He is not hypoxic.  Initial laboratory evaluation is concerning for a number of abnormalities on metabolic panel including hyponatremia, hypokalemia, slightly decreased bicarb, acute kidney injury with a creatinine of 1.45, and slightly elevated LFTs.  His CBC is grossly abnormal with a hemoglobin of 6.3 which is about a 5 point drop in the last month.  No leukocytosis and no thrombocytopenia.  Urinalysis is generally unremarkable but his urine drug screen is positive for cocaine and benzodiazepines.  His BNP is stable at about 1000.  Lipase is up slightly but the patient has no tenderness to palpation the epigastrium.  The patient's lactic acid is 5.1.  The patient is noted to have a  lactate>4. With the current information available to me, I do not think the patient is in septic shock. The lactate>4, is related to liver failure , drug / toxin overdose, and OTHER SHOCK (hemorrhagic) .  He is having no hematemesis or vomiting of any kind and I do not think he needs empiric antibiotics at this time.  Given the patient's history of polysubstance abuse and alcohol abuse, I placed him on CIWA.  Plan is for interventions/medications as listed below, small fluid bolus, and I will consult the ICU for possible admission.  The patient is on the cardiac monitor to evaluate for evidence of arrhythmia and/or significant heart rate changes.   Labs/studies ordered: CMP, CBC with differential, urinalysis, urine drug screen, BNP, pro time-INR, APTT, lipase, high-sensitivity troponin x 2, magnesium level, lactic acid x 2, type and screen, blood cultures x 2 Interventions/Medications given: Normal saline 500 mL IV bolus, Tylenol 1000 mg p.o. (patient required something for pain), magnesium sulfate 2 g IV, potassium 40 mill equivalents p.o., thiamine 100 mg IV, transfuse PRBCs 1 unit with 2 units crossmatched and advance   Clinical Course as of 01/17/23 0337  Fri Jan 17, 2023  0046 BP(!): 148/99 Erroneous - patient is consistently hypotensive at about  [CF]  0054 After seeing the extensive combination of potentially life-threatening issues, including acute kidney injury, continued cocaine use, NSTEMI, severely elevated lactic acid, persistent hypotension, etc., I had an honest and open conversation with the patient.  He first tried to tell me that someone "blew cocaine in my face".  When I expressed skepticism, he started to cry and admitted that he has a drug and alcohol problem.  It sounds like he drinks at least a 12 pack of beer a day, possibly more.  He admits to drinking earlier today.  He adamantly denies the use of benzodiazepines.  Regardless, the patient needs a higher level of care.  I  consulted Domingo Pulse Rust-Chester, ICU NP, and we discussed the case.  She will come to the emergency department and evaluate the patient.  I feel that the patient would be appropriate for at least a stepdown level of care given his constellation of issues, including CHF, hypertension, NSTEMI (likely demand ischemia in the setting of acute blood loss anemia), anemia requiring blood transfusion, etc. [CF]  0056 I consented the patient for blood products. [CF]  [place] Rust[place]Chester, ICU NP, consulted on the patient in the emergency department.  She feels that he is appropriate for the hospitalist service but she said that if they want to consult her or the PCCM team that they can certainly do so.  I am consulting the hospitalist for admission. [CF]  0133 Consulted by phone with Dr. Damita Dunnings of the hospitalist service.  She will admit. [CF]  [phone] The radiologist called me to inform me that there is a mild blush in the rectum to suggest an active bleed but it is relatively small.  No bleeding in the intestines or stomach.  I reconsulted the hospitalist and updated both the ICU provider and the hospitalist provider to let them know.  The patient's blood pressure remains in the mid 68E systolic but he remains awake and alert and more well-appearing than his vitals and lab work would suggest.  The unit of PRBCs is now available in the blood bank and the nurse is going to get it and will start administration which should also help with the patient's blood pressure.  I am deferring to Dr. Damita Dunnings  with the hospitalist service and Domingo Pulse Rust-Chester, ICU NP, regarding the plan for admission. [CF]  0327 Dr. Damita Dunnings with the hospitalist service is admitting the patient [CF]    Clinical Course User Index [CF] Hinda Kehr, MD     FINAL CLINICAL IMPRESSION(S) / ED DIAGNOSES   Final diagnoses:  NSTEMI (non-ST elevated myocardial infarction) [facility])  Demand ischemia  Acute blood loss anemia   Peripheral edema  Acute kidney injury ([place])  Alcohol dependence with unspecified alcohol-induced disorder ([place])  Cocaine dependence with cocaine-induced disorder ([place])  History of benzodiazepine use  Lactic acidosis  Rectal bleeding     Rx / DC Orders   ED Discharge Orders     None        Note:  This document was prepared using Dragon voice recognition software and may include unintentional dictation errors.   Hinda Kehr, MD 01/17/23 [phone]

## 2023-01-16 NOTE — ED Triage Notes (Signed)
Pt presents via [facility] with complaints of bilateral leg swelling for the last several day. Hx of CHF and is compliant with his medications. +2 pitting edema to the legs with associated dizziness. Denies CP or SOB.    VSS with EMS.

## 2023-01-17 ENCOUNTER — Encounter: Payer: Self-pay | Admitting: Internal Medicine

## 2023-01-17 ENCOUNTER — Inpatient Hospital Stay (HOSPITAL_COMMUNITY)
Admit: 2023-01-17 | Discharge: 2023-01-17 | Disposition: A | Discharge status: Home / Self Care | Payer: Medicare HMO | Attending: Internal Medicine | Admitting: Internal Medicine

## 2023-01-17 ENCOUNTER — Inpatient Hospital Stay: Payer: Self-pay

## 2023-01-17 ENCOUNTER — Encounter: Payer: Medicare HMO | Admitting: Adult Health

## 2023-01-17 ENCOUNTER — Emergency Department: Payer: Medicare HMO

## 2023-01-17 ENCOUNTER — Inpatient Hospital Stay: Payer: Medicare HMO

## 2023-01-17 DIAGNOSIS — R578 Other shock: Secondary | ICD-10-CM | POA: Diagnosis present

## 2023-01-17 DIAGNOSIS — I2510 Atherosclerotic heart disease of native coronary artery without angina pectoris: Secondary | ICD-10-CM | POA: Diagnosis not present

## 2023-01-17 DIAGNOSIS — I493 Ventricular premature depolarization: Secondary | ICD-10-CM | POA: Diagnosis not present

## 2023-01-17 DIAGNOSIS — F1429 Cocaine dependence with unspecified cocaine-induced disorder: Secondary | ICD-10-CM | POA: Diagnosis present

## 2023-01-17 DIAGNOSIS — K625 Hemorrhage of anus and rectum: Secondary | ICD-10-CM | POA: Diagnosis not present

## 2023-01-17 DIAGNOSIS — J9601 Acute respiratory failure with hypoxia: Secondary | ICD-10-CM | POA: Diagnosis not present

## 2023-01-17 DIAGNOSIS — K7030 Alcoholic cirrhosis of liver without ascites: Secondary | ICD-10-CM | POA: Diagnosis present

## 2023-01-17 DIAGNOSIS — K7460 Unspecified cirrhosis of liver: Secondary | ICD-10-CM | POA: Diagnosis not present

## 2023-01-17 DIAGNOSIS — F1029 Alcohol dependence with unspecified alcohol-induced disorder: Secondary | ICD-10-CM | POA: Diagnosis not present

## 2023-01-17 DIAGNOSIS — R778 Other specified abnormalities of plasma proteins: Secondary | ICD-10-CM

## 2023-01-17 DIAGNOSIS — I774 Celiac artery compression syndrome: Secondary | ICD-10-CM | POA: Diagnosis not present

## 2023-01-17 DIAGNOSIS — E860 Dehydration: Secondary | ICD-10-CM | POA: Diagnosis present

## 2023-01-17 DIAGNOSIS — I959 Hypotension, unspecified: Secondary | ICD-10-CM | POA: Diagnosis not present

## 2023-01-17 DIAGNOSIS — I5033 Acute on chronic diastolic (congestive) heart failure: Secondary | ICD-10-CM | POA: Diagnosis present

## 2023-01-17 DIAGNOSIS — E785 Hyperlipidemia, unspecified: Secondary | ICD-10-CM | POA: Diagnosis present

## 2023-01-17 DIAGNOSIS — E669 Obesity, unspecified: Secondary | ICD-10-CM | POA: Diagnosis present

## 2023-01-17 DIAGNOSIS — I9589 Other hypotension: Secondary | ICD-10-CM | POA: Diagnosis not present

## 2023-01-17 DIAGNOSIS — I951 Orthostatic hypotension: Secondary | ICD-10-CM | POA: Diagnosis present

## 2023-01-17 DIAGNOSIS — I2489 Other forms of acute ischemic heart disease: Secondary | ICD-10-CM | POA: Diagnosis not present

## 2023-01-17 DIAGNOSIS — I214 Non-ST elevation (NSTEMI) myocardial infarction: Secondary | ICD-10-CM

## 2023-01-17 DIAGNOSIS — I110 Hypertensive heart disease with heart failure: Secondary | ICD-10-CM | POA: Diagnosis present

## 2023-01-17 DIAGNOSIS — F1020 Alcohol dependence, uncomplicated: Secondary | ICD-10-CM | POA: Diagnosis present

## 2023-01-17 DIAGNOSIS — F1410 Cocaine abuse, uncomplicated: Secondary | ICD-10-CM | POA: Diagnosis not present

## 2023-01-17 DIAGNOSIS — K641 Second degree hemorrhoids: Secondary | ICD-10-CM | POA: Diagnosis present

## 2023-01-17 DIAGNOSIS — R2240 Localized swelling, mass and lump, unspecified lower limb: Secondary | ICD-10-CM | POA: Diagnosis not present

## 2023-01-17 DIAGNOSIS — Z6838 Body mass index (BMI) 38.0-38.9, adult: Secondary | ICD-10-CM | POA: Diagnosis not present

## 2023-01-17 DIAGNOSIS — D62 Acute posthemorrhagic anemia: Secondary | ICD-10-CM | POA: Diagnosis present

## 2023-01-17 DIAGNOSIS — R601 Generalized edema: Secondary | ICD-10-CM | POA: Diagnosis present

## 2023-01-17 DIAGNOSIS — R600 Localized edema: Secondary | ICD-10-CM | POA: Diagnosis present

## 2023-01-17 DIAGNOSIS — Z1152 Encounter for screening for COVID-19: Secondary | ICD-10-CM | POA: Diagnosis not present

## 2023-01-17 DIAGNOSIS — F32A Depression, unspecified: Secondary | ICD-10-CM | POA: Diagnosis present

## 2023-01-17 DIAGNOSIS — K766 Portal hypertension: Secondary | ICD-10-CM | POA: Diagnosis present

## 2023-01-17 DIAGNOSIS — K648 Other hemorrhoids: Secondary | ICD-10-CM | POA: Diagnosis not present

## 2023-01-17 DIAGNOSIS — R609 Edema, unspecified: Secondary | ICD-10-CM

## 2023-01-17 DIAGNOSIS — F1090 Alcohol use, unspecified, uncomplicated: Secondary | ICD-10-CM | POA: Diagnosis not present

## 2023-01-17 DIAGNOSIS — K551 Chronic vascular disorders of intestine: Secondary | ICD-10-CM | POA: Diagnosis present

## 2023-01-17 DIAGNOSIS — F1210 Cannabis abuse, uncomplicated: Secondary | ICD-10-CM | POA: Diagnosis present

## 2023-01-17 DIAGNOSIS — R109 Unspecified abdominal pain: Secondary | ICD-10-CM | POA: Diagnosis not present

## 2023-01-17 DIAGNOSIS — E271 Primary adrenocortical insufficiency: Secondary | ICD-10-CM | POA: Diagnosis not present

## 2023-01-17 DIAGNOSIS — F1420 Cocaine dependence, uncomplicated: Secondary | ICD-10-CM | POA: Diagnosis present

## 2023-01-17 DIAGNOSIS — N179 Acute kidney failure, unspecified: Secondary | ICD-10-CM | POA: Diagnosis present

## 2023-01-17 DIAGNOSIS — E872 Acidosis, unspecified: Secondary | ICD-10-CM | POA: Diagnosis present

## 2023-01-17 DIAGNOSIS — R0602 Shortness of breath: Secondary | ICD-10-CM | POA: Diagnosis not present

## 2023-01-17 DIAGNOSIS — Z87898 Personal history of other specified conditions: Secondary | ICD-10-CM

## 2023-01-17 DIAGNOSIS — E871 Hypo-osmolality and hyponatremia: Secondary | ICD-10-CM | POA: Diagnosis present

## 2023-01-17 LAB — BASIC METABOLIC PANEL
Anion gap: 9 (ref 5–15)
BUN: 18 mg/dL (ref 6–20)
CO2: 23 mmol/L (ref 22–32)
Calcium: 7.2 mg/dL — ABNORMAL LOW (ref 8.9–10.3)
Chloride: 96 mmol/L — ABNORMAL LOW (ref 98–111)
Creatinine, Ser: 1.3 mg/dL — ABNORMAL HIGH (ref 0.61–1.24)
GFR, Estimated: >60 mL/min (ref >60)
Glucose, Bld: 172 mg/dL — ABNORMAL HIGH (ref 70–99)
Potassium: 2.9 mmol/L — ABNORMAL LOW (ref 3.5–5.1)
Sodium: 128 mmol/L — ABNORMAL LOW (ref 135–145)

## 2023-01-17 LAB — COMPREHENSIVE METABOLIC PANEL
ALT: 24 U/L (ref 0–44)
AST: 99 U/L — ABNORMAL HIGH (ref 15–41)
Albumin: 2.6 g/dL — ABNORMAL LOW (ref 3.5–5.0)
Alkaline Phosphatase: 271 U/L — ABNORMAL HIGH (ref 38–126)
Anion gap: 14 (ref 5–15)
BUN: 20 mg/dL (ref 6–20)
CO2: 20 mmol/L — ABNORMAL LOW (ref 22–32)
Calcium: 7.6 mg/dL — ABNORMAL LOW (ref 8.9–10.3)
Chloride: 94 mmol/L — ABNORMAL LOW (ref 98–111)
Creatinine, Ser: 1.45 mg/dL — ABNORMAL HIGH (ref 0.61–1.24)
GFR, Estimated: 59 mL/min — ABNORMAL LOW (ref >60)
Glucose, Bld: 109 mg/dL — ABNORMAL HIGH (ref 70–99)
Potassium: 2.8 mmol/L — ABNORMAL LOW (ref 3.5–5.1)
Sodium: 128 mmol/L — ABNORMAL LOW (ref 135–145)
Total Bilirubin: 1.5 mg/dL — ABNORMAL HIGH (ref 0.3–1.2)
Total Protein: 6.8 g/dL (ref 6.5–8.1)

## 2023-01-17 LAB — URINE DRUG SCREEN, QUALITATIVE (ARMC ONLY)
Amphetamines, Ur Screen: NOT DETECTED
Barbiturates, Ur Screen: NOT DETECTED
Benzodiazepine, Ur Scrn: POSITIVE — AB
Cannabinoid 50 Ng, Ur ~~LOC~~: NOT DETECTED
Cocaine Metabolite,Ur ~~LOC~~: POSITIVE — AB
MDMA (Ecstasy)Ur Screen: NOT DETECTED
Methadone Scn, Ur: NOT DETECTED
Opiate, Ur Screen: NOT DETECTED
Phencyclidine (PCP) Ur S: NOT DETECTED
Tricyclic, Ur Screen: NOT DETECTED

## 2023-01-17 LAB — TROPONIN I (HIGH SENSITIVITY)
Troponin I (High Sensitivity): 431 ng/L (ref <18)
Troponin I (High Sensitivity): 436 ng/L (ref <18)

## 2023-01-17 LAB — BLOOD GAS, VENOUS
Acid-Base Excess: 1.5 mmol/L (ref 0.0–2.0)
Bicarbonate: 25.9 mmol/L (ref 20.0–28.0)
O2 Saturation: 48.2 %
Patient temperature: 37
pCO2, Ven: 39 mmHg — ABNORMAL LOW (ref 44–60)
pH, Ven: 7.43 (ref 7.25–7.43)
pO2, Ven: 33 mmHg (ref 32–45)

## 2023-01-17 LAB — ECHOCARDIOGRAM COMPLETE
Height: 65 in
S' Lateral: 4.35 cm
Weight: 3587.33 oz

## 2023-01-17 LAB — LACTIC ACID, PLASMA
Lactic Acid, Venous: 1.6 mmol/L (ref 0.5–1.9)
Lactic Acid, Venous: 3.2 mmol/L (ref 0.5–1.9)
Lactic Acid, Venous: 5.1 mmol/L (ref 0.5–1.9)

## 2023-01-17 LAB — CBC WITH DIFFERENTIAL/PLATELET
Abs Immature Granulocytes: 0.03 10*3/uL (ref 0.00–0.07)
Abs Immature Granulocytes: 0.05 10*3/uL (ref 0.00–0.07)
Basophils Absolute: 0 10*3/uL (ref 0.0–0.1)
Basophils Absolute: 0.1 10*3/uL (ref 0.0–0.1)
Basophils Relative: 0 %
Basophils Relative: 1 %
Eosinophils Absolute: 0.1 10*3/uL (ref 0.0–0.5)
Eosinophils Absolute: 0.1 10*3/uL (ref 0.0–0.5)
Eosinophils Relative: 1 %
Eosinophils Relative: 1 %
HCT: 20 % — ABNORMAL LOW (ref 39.0–52.0)
HCT: 22.9 % — ABNORMAL LOW (ref 39.0–52.0)
Hemoglobin: 6.3 g/dL — ABNORMAL LOW (ref 13.0–17.0)
Hemoglobin: 7.5 g/dL — ABNORMAL LOW (ref 13.0–17.0)
Immature Granulocytes: 0 %
Immature Granulocytes: 1 %
Lymphocytes Relative: 12 %
Lymphocytes Relative: 20 %
Lymphs Abs: 1.1 10*3/uL (ref 0.7–4.0)
Lymphs Abs: 1.8 10*3/uL (ref 0.7–4.0)
MCH: 27.9 pg (ref 26.0–34.0)
MCH: 28.5 pg (ref 26.0–34.0)
MCHC: 31.5 g/dL (ref 30.0–36.0)
MCHC: 32.8 g/dL (ref 30.0–36.0)
MCV: 87.1 fL (ref 80.0–100.0)
MCV: 88.5 fL (ref 80.0–100.0)
Monocytes Absolute: 0.9 10*3/uL (ref 0.1–1.0)
Monocytes Absolute: 1 10*3/uL (ref 0.1–1.0)
Monocytes Relative: 10 %
Monocytes Relative: 11 %
Neutro Abs: 6.1 10*3/uL (ref 1.7–7.7)
Neutro Abs: 6.7 10*3/uL (ref 1.7–7.7)
Neutrophils Relative %: 67 %
Neutrophils Relative %: 76 %
Platelets: 206 10*3/uL (ref 150–400)
Platelets: 213 10*3/uL (ref 150–400)
RBC: 2.26 MIL/uL — ABNORMAL LOW (ref 4.22–5.81)
RBC: 2.63 MIL/uL — ABNORMAL LOW (ref 4.22–5.81)
RDW: 16.8 % — ABNORMAL HIGH (ref 11.5–15.5)
RDW: 18.5 % — ABNORMAL HIGH (ref 11.5–15.5)
WBC: 8.8 10*3/uL (ref 4.0–10.5)
WBC: 9 10*3/uL (ref 4.0–10.5)
nRBC: 0 % (ref 0.0–0.2)
nRBC: 0 % (ref 0.0–0.2)

## 2023-01-17 LAB — MAGNESIUM: Magnesium: 1.7 mg/dL (ref 1.7–2.4)

## 2023-01-17 LAB — URINALYSIS, ROUTINE W REFLEX MICROSCOPIC
Bacteria, UA: NONE SEEN
Bilirubin Urine: NEGATIVE
Glucose, UA: NEGATIVE mg/dL
Hgb urine dipstick: NEGATIVE
Ketones, ur: NEGATIVE mg/dL
Leukocytes,Ua: NEGATIVE
Nitrite: NEGATIVE
Protein, ur: 30 mg/dL — AB
Specific Gravity, Urine: 1.015 (ref 1.005–1.030)
pH: 5 (ref 5.0–8.0)

## 2023-01-17 LAB — RESP PANEL BY RT-PCR (RSV, FLU A&B, COVID)  RVPGX2
Influenza A by PCR: NEGATIVE
Influenza B by PCR: NEGATIVE
Resp Syncytial Virus by PCR: NEGATIVE
SARS Coronavirus 2 by RT PCR: NEGATIVE

## 2023-01-17 LAB — PREPARE RBC (CROSSMATCH)

## 2023-01-17 LAB — PROTIME-INR
INR: 1.3 — ABNORMAL HIGH (ref 0.8–1.2)
Prothrombin Time: 16 seconds — ABNORMAL HIGH (ref 11.4–15.2)

## 2023-01-17 LAB — BRAIN NATRIURETIC PEPTIDE: B Natriuretic Peptide: 1052.4 pg/mL — ABNORMAL HIGH (ref 0.0–100.0)

## 2023-01-17 LAB — PHOSPHORUS: Phosphorus: 2.5 mg/dL (ref 2.5–4.6)

## 2023-01-17 LAB — D-DIMER, QUANTITATIVE: D-Dimer, Quant: 2.57 ug/mL-FEU — ABNORMAL HIGH (ref 0.00–0.50)

## 2023-01-17 LAB — APTT: aPTT: 37 seconds — ABNORMAL HIGH (ref 24–36)

## 2023-01-17 LAB — HEMOGLOBIN AND HEMATOCRIT, BLOOD
HCT: 18.3 % — ABNORMAL LOW (ref 39.0–52.0)
Hemoglobin: 6 g/dL — ABNORMAL LOW (ref 13.0–17.0)

## 2023-01-17 LAB — POTASSIUM: Potassium: 4.2 mmol/L (ref 3.5–5.1)

## 2023-01-17 LAB — MRSA NEXT GEN BY PCR, NASAL: MRSA by PCR Next Gen: NOT DETECTED

## 2023-01-17 LAB — GLUCOSE, CAPILLARY: Glucose-Capillary: 108 mg/dL — ABNORMAL HIGH (ref 70–99)

## 2023-01-17 LAB — LIPASE, BLOOD: Lipase: 78 U/L — ABNORMAL HIGH (ref 11–51)

## 2023-01-17 LAB — FIBRINOGEN: Fibrinogen: 281 mg/dL (ref 210–475)

## 2023-01-17 LAB — CK: Total CK: 161 U/L (ref 49–397)

## 2023-01-17 MED ORDER — ACETAMINOPHEN 325 MG PO TABS
650.0000 mg | ORAL_TABLET | ORAL | Status: DC | PRN
  Administered 2023-01-17 – 2023-01-26 (×2): 650 mg via ORAL
  Filled 2023-01-17 (×3): qty 2

## 2023-01-17 MED ORDER — THIAMINE HCL 100 MG/ML IJ SOLN
100.0000 mg | Freq: Every day | INTRAMUSCULAR | Status: DC
  Administered 2023-01-18 – 2023-01-20 (×2): 100 mg via INTRAVENOUS
  Filled 2023-01-17 (×5): qty 2

## 2023-01-17 MED ORDER — SODIUM CHLORIDE 0.9 % IV SOLN
250.0000 mL | INTRAVENOUS | Status: DC
  Administered 2023-01-18: 250 mL via INTRAVENOUS

## 2023-01-17 MED ORDER — SODIUM CHLORIDE 0.9 % IV SOLN
50.0000 ug/h | INTRAVENOUS | Status: DC
  Administered 2023-01-17 – 2023-01-20 (×7): 50 ug/h via INTRAVENOUS
  Filled 2023-01-17 (×10): qty 1

## 2023-01-17 MED ORDER — LORAZEPAM 1 MG PO TABS
1.0000 mg | ORAL_TABLET | ORAL | Status: AC | PRN
  Administered 2023-01-18: 2 mg via ORAL
  Filled 2023-01-17: qty 2

## 2023-01-17 MED ORDER — SODIUM CHLORIDE 0.9% FLUSH
10.0000 mL | Freq: Two times a day (BID) | INTRAVENOUS | Status: DC
  Administered 2023-01-17 – 2023-01-24 (×15): 10 mL
  Administered 2023-01-25: 30 mL
  Administered 2023-01-25 – 2023-01-27 (×5): 10 mL
  Administered 2023-01-28: 30 mL
  Administered 2023-01-28: 10 mL
  Administered 2023-01-29: 30 mL
  Administered 2023-01-29 – 2023-01-31 (×4): 10 mL

## 2023-01-17 MED ORDER — SODIUM CHLORIDE 0.9% IV SOLUTION: Freq: Once | INTRAVENOUS | Status: DC

## 2023-01-17 MED ORDER — ADULT MULTIVITAMIN W/MINERALS CH
1.0000 | ORAL_TABLET | Freq: Every day | ORAL | Status: DC
  Administered 2023-01-17 – 2023-01-31 (×14): 1 via ORAL
  Filled 2023-01-17 (×12): qty 1

## 2023-01-17 MED ORDER — OCTREOTIDE LOAD VIA INFUSION
50.0000 ug | Freq: Once | INTRAVENOUS | Status: AC
  Administered 2023-01-17: 50 ug via INTRAVENOUS
  Filled 2023-01-17: qty 25

## 2023-01-17 MED ORDER — NOREPINEPHRINE 4 MG/250ML-% IV SOLN
2.0000 ug/min | INTRAVENOUS | Status: DC
  Administered 2023-01-17: 4 ug/min via INTRAVENOUS
  Administered 2023-01-17: 7 ug/min via INTRAVENOUS
  Administered 2023-01-18: 1 ug/min via INTRAVENOUS
  Administered 2023-01-18: 5 ug/min via INTRAVENOUS
  Filled 2023-01-17 (×4): qty 250

## 2023-01-17 MED ORDER — ACETAMINOPHEN 500 MG PO TABS
1000.0000 mg | ORAL_TABLET | Freq: Once | ORAL | Status: AC
  Administered 2023-01-17: 1000 mg via ORAL
  Filled 2023-01-17: qty 2

## 2023-01-17 MED ORDER — PANTOPRAZOLE SODIUM 40 MG IV SOLR: 40.0000 mg | INTRAVENOUS | Status: DC

## 2023-01-17 MED ORDER — POTASSIUM CHLORIDE CRYS ER 20 MEQ PO TBCR
40.0000 meq | EXTENDED_RELEASE_TABLET | Freq: Once | ORAL | Status: AC
  Administered 2023-01-17: 40 meq via ORAL
  Filled 2023-01-17: qty 2

## 2023-01-17 MED ORDER — THIAMINE HCL 100 MG/ML IJ SOLN
100.0000 mg | Freq: Once | INTRAMUSCULAR | Status: AC
  Administered 2023-01-17: 100 mg via INTRAVENOUS
  Filled 2023-01-17: qty 2

## 2023-01-17 MED ORDER — POLYETHYLENE GLYCOL 3350 17 G PO PACK
17.0000 g | PACK | Freq: Every day | ORAL | Status: DC
  Administered 2023-01-17 – 2023-01-29 (×5): 17 g via ORAL
  Filled 2023-01-17 (×11): qty 1

## 2023-01-17 MED ORDER — SODIUM CHLORIDE 0.9% FLUSH: 10.0000 mL | INTRAVENOUS | Status: DC | PRN

## 2023-01-17 MED ORDER — SODIUM CHLORIDE 0.45 % IV SOLN
INTRAVENOUS | Status: DC
  Filled 2023-01-17: qty 75

## 2023-01-17 MED ORDER — IOHEXOL 350 MG/ML SOLN
100.0000 mL | Freq: Once | INTRAVENOUS | Status: AC | PRN
  Administered 2023-01-17: 100 mL via INTRAVENOUS

## 2023-01-17 MED ORDER — FENTANYL CITRATE PF 50 MCG/ML IJ SOSY
25.0000 ug | PREFILLED_SYRINGE | INTRAMUSCULAR | Status: DC | PRN
  Administered 2023-01-17 – 2023-01-18 (×7): 25 ug via INTRAVENOUS
  Filled 2023-01-17 (×7): qty 1

## 2023-01-17 MED ORDER — FOLIC ACID 1 MG PO TABS
1.0000 mg | ORAL_TABLET | Freq: Every day | ORAL | Status: DC
  Administered 2023-01-17 – 2023-01-20 (×4): 1 mg via ORAL
  Filled 2023-01-17 (×4): qty 1

## 2023-01-17 MED ORDER — NICOTINE 14 MG/24HR TD PT24
14.0000 mg | MEDICATED_PATCH | Freq: Every day | TRANSDERMAL | Status: DC
  Administered 2023-01-17 – 2023-01-31 (×11): 14 mg via TRANSDERMAL
  Filled 2023-01-17 (×14): qty 1

## 2023-01-17 MED ORDER — VITAMIN K1 10 MG/ML IJ SOLN
10.0000 mg | Freq: Once | INTRAVENOUS | Status: AC
  Administered 2023-01-17: 10 mg via INTRAVENOUS
  Filled 2023-01-17: qty 1

## 2023-01-17 MED ORDER — POTASSIUM CHLORIDE CRYS ER 20 MEQ PO TBCR
20.0000 meq | EXTENDED_RELEASE_TABLET | Freq: Once | ORAL | Status: AC
  Administered 2023-01-17: 20 meq via ORAL
  Filled 2023-01-17: qty 1

## 2023-01-17 MED ORDER — MIDODRINE HCL 5 MG PO TABS
2.5000 mg | ORAL_TABLET | Freq: Three times a day (TID) | ORAL | Status: DC
  Administered 2023-01-17 – 2023-01-19 (×6): 2.5 mg via ORAL
  Filled 2023-01-17 (×6): qty 1

## 2023-01-17 MED ORDER — DOCUSATE SODIUM 100 MG PO CAPS
100.0000 mg | ORAL_CAPSULE | Freq: Two times a day (BID) | ORAL | Status: DC
  Administered 2023-01-17 – 2023-01-30 (×15): 100 mg via ORAL
  Filled 2023-01-17 (×25): qty 1

## 2023-01-17 MED ORDER — SODIUM CHLORIDE 0.9 % IV SOLN
2.0000 g | INTRAVENOUS | Status: DC
  Administered 2023-01-17 – 2023-01-21 (×5): 2 g via INTRAVENOUS
  Filled 2023-01-17: qty 2
  Filled 2023-01-17: qty 20
  Filled 2023-01-17 (×2): qty 2
  Filled 2023-01-17: qty 20

## 2023-01-17 MED ORDER — SODIUM CHLORIDE 0.9 % IV BOLUS
500.0000 mL | Freq: Once | INTRAVENOUS | Status: AC
  Administered 2023-01-17: 500 mL via INTRAVENOUS

## 2023-01-17 MED ORDER — SODIUM CHLORIDE 0.9 % IV SOLN
10.0000 mL/h | Freq: Once | INTRAVENOUS | Status: AC
  Administered 2023-01-17: 10 mL/h via INTRAVENOUS

## 2023-01-17 MED ORDER — CHLORHEXIDINE GLUCONATE CLOTH 2 % EX PADS
6.0000 | MEDICATED_PAD | Freq: Every day | CUTANEOUS | Status: DC
  Administered 2023-01-18 – 2023-01-20 (×3): 6 via TOPICAL

## 2023-01-17 MED ORDER — SODIUM CHLORIDE 0.9 % IV SOLN
1.0000 mg | Freq: Every day | INTRAVENOUS | Status: DC
  Filled 2023-01-17 (×5): qty 0.2

## 2023-01-17 MED ORDER — MAGNESIUM SULFATE 2 GM/50ML IV SOLN
2.0000 g | Freq: Once | INTRAVENOUS | Status: AC
  Administered 2023-01-17: 2 g via INTRAVENOUS
  Filled 2023-01-17: qty 50

## 2023-01-17 MED ORDER — THIAMINE MONONITRATE 100 MG PO TABS
100.0000 mg | ORAL_TABLET | Freq: Every day | ORAL | Status: DC
  Administered 2023-01-17 – 2023-01-31 (×13): 100 mg via ORAL
  Filled 2023-01-17 (×14): qty 1

## 2023-01-17 MED ORDER — SODIUM CHLORIDE 0.9% IV SOLUTION: Freq: Once | INTRAVENOUS | Status: AC

## 2023-01-17 MED ORDER — POTASSIUM CHLORIDE 10 MEQ/100ML IV SOLN
10.0000 meq | INTRAVENOUS | Status: DC
  Administered 2023-01-17 (×2): 10 meq via INTRAVENOUS
  Filled 2023-01-17 (×4): qty 100

## 2023-01-17 MED ORDER — PANTOPRAZOLE SODIUM 40 MG IV SOLR
40.0000 mg | Freq: Two times a day (BID) | INTRAVENOUS | Status: DC
  Administered 2023-01-17 – 2023-01-23 (×13): 40 mg via INTRAVENOUS
  Filled 2023-01-17 (×13): qty 10

## 2023-01-17 NOTE — Consult Note (Signed)
Date of Consultation:  01/17/2023  Requesting Physician:  Judd Gaudier, MD  Reason for Consultation:  Bleeding per rectum  History of Present Illness: Ethan Wells is a 52 y.o. male presenting to the hospital with worsening bilateral lower extremity swelling.  He was also reporting continued blood per rectum with bowel movements.  He's s/p EUA and hemorrhoidectomy for bleeding hemorrhoids on 10/28/22, requiring takeback to OR to ligate two pedicles on 10/31/22.  He did not show up for his post-operative follow up appointment.  In the ED, he was found to be hypotensive, with very low Hgb of 6.3, lactic acid of 5.1, elevated troponin of 431, BNO 1052, AKI with Cr 1.45, and significnat dehydration changes with electrolyte abnormalities.  After further asking, he admitted to continue to drink heavily and doing drugs.  He was positive for cocaine and benzos on UDS.  He had a CTA which showed evidence of cirrhosis and portal hypertension as well as a possible are of bleeding in the rectum.  He was admitted to ICU and is currently getting blood transfusion and is currently on norepinephrine.  Upon my entering his room, he tells me that I "have messed me up" and declined any surgery.  He reports that he still strains when having bowel movements.  He reports that he sees blood with every bowel movements, sometimes less, sometimes more.  Denies any significant pain.  Past Medical History: Past Medical History:  Diagnosis Date   CAD (coronary artery disease)    a. 01/2011 Anterior STEMI/Cath/PCI: LM nl, LAD 100d (3.5x100m Vision BMS placed), LCX 215mRI 50, RCA min irregs, EF 40% w/ apical, inferoapical HK; b. 10/2022 NSTSEMI/Cath: LM nl, LAD 5p/m, RI 85, LCX 10020mM1 mild dzs, OM3 100 fills via L->L collats from dLAD, RCA 80p, 100m12mDA fills via collats from LAD. EF 45-50%-->Med Rx.   Cardiac arrest - ventricular fibrillation    a. In setting of STEMI 01/2011   Chronic pain    Cocaine abuse (HCC)[place]  Depression with anxiety    ETOH abuse    a. 6-12 beers/day   GERD (gastroesophageal reflux disease)    GI bleed    Hemorrhoids    Hyperlipidemia    Hypertension    Ischemic cardiomyopathy    a. 06/2011 Echo: EF 45-50%, No rwma; b. 10/2022 Echo: EF 55-60%, no rwma, nl RV fxn.   Marijuana abuse    Migraines    Tobacco abuse    a. 1/2 ppd x 26 yrs     Past Surgical History: Past Surgical History:  Procedure Laterality Date   COLONOSCOPY  08/09/2021   Procedure: COLONOSCOPY;  Surgeon: WohlLucilla Lame;  [facility];  Service: Endoscopy;;   CORONARY ANGIOPLASTY WITH STENT PLACEMENT     ESOPHAGOGASTRODUODENOSCOPY N/A 05/30/2022   Procedure: ESOPHAGOGASTRODUODENOSCOPY (EGD);  Surgeon: VangLin Landsman;  [facility];  Service: Gastroenterology;  Laterality: N/A;   ESOPHAGOGASTRODUODENOSCOPY (EGD) WITH PROPOFOL N/A 03/01/2021   Procedure: ESOPHAGOGASTRODUODENOSCOPY (EGD) WITH PROPOFOL;  Surgeon: VangLin Landsman;  [facility];  Laterality: N/A;   ESOPHAGOGASTRODUODENOSCOPY (EGD) WITH PROPOFOL N/A 08/09/2021   Procedure: ESOPHAGOGASTRODUODENOSCOPY (EGD) WITH PROPOFOL;  Surgeon: WohlLucilla Lame;  [facility];  Service: Endoscopy;  Laterality: N/A;   EVALUATION UNDER ANESTHESIA WITH HEMORRHOIDECTOMY N/A 10/31/2022   Procedure: EXAM UNDER ANESTHESIA WITH SUTURE LIGATION OF TWO BLEEDING PEDICLES;  Surgeon: PiscOlean Ree;  Location: [facility];  Service: General;  Laterality:  N/A;   FLEXIBLE SIGMOIDOSCOPY N/A 10/02/2022   Procedure: FLEXIBLE SIGMOIDOSCOPY;  Surgeon: Ladene Artist, MD;  Location: [facility];  Service: Gastroenterology;  Laterality: N/A;   LEFT HEART CATH AND CORONARY ANGIOGRAPHY N/A 11/04/2022   Procedure: LEFT HEART CATH AND CORONARY ANGIOGRAPHY;  Surgeon: Wellington Hampshire, MD;  Location: [place] [facility] LAB;  Service: Cardiovascular;  Laterality: N/A;   VISCERAL ANGIOGRAPHY N/A  10/24/2022   Procedure: VISCERAL ANGIOGRAPHY;  Surgeon: Algernon Huxley, MD;  Location: [place] CV LAB;  Service: Cardiovascular;  Laterality: N/A;    Home Medications: Prior to Admission medications   Medication Sig Start Date End Date Taking? Authorizing Provider  acetaminophen (TYLENOL) 500 MG tablet Take 2 tablets (1,000 mg total) by mouth every 6 (six) hours as needed for mild pain. 10/28/22   Olean Ree, MD  aspirin 81 MG chewable tablet Chew 1 tablet (81 mg total) by mouth daily. 11/06/22 02/04/23  Enzo Bi, MD  calcium-vitamin D (OSCAL WITH D) 500-5 MG-MCG tablet Take 1 tablet by mouth 2 (two) times daily. 10/17/22   Medina-Vargas, Monina C, NP  chlordiazePOXIDE (LIBRIUM) 5 MG capsule Take 3 tablets by mouth twice a day x 2 days; then 2 tablets by mouth twice a day x 2 days; then 1 tablet by mouth 3 times a day x 2 days; then 1 tablet by mouth twice a day x 2 days; then 1 tablet by mouth daily x 3 days and stop Librium. Patient not taking: Reported on 01/17/2023 11/21/22   Barton Dubois, MD  ezetimibe (ZETIA) 10 MG tablet Take 10 mg by mouth daily.    [provider]  ferrous sulfate 325 (65 FE) MG tablet Take 1 tablet (325 mg total) by mouth daily with breakfast. 11/06/22 02/04/23  Enzo Bi, MD  folic acid (FOLVITE) 1 MG tablet Take 1 tablet (1 mg total) by mouth daily. 11/06/22   Enzo Bi, MD  furosemide (LASIX) 40 MG tablet Take 1 tablet (40 mg total) by mouth daily. Patient taking differently: Take 40 mg by mouth 2 (two) times daily. 11/21/22 02/19/23  Barton Dubois, MD  gabapentin (NEURONTIN) 300 MG capsule Take 1 capsule (300 mg total) by mouth 3 (three) times daily. 10/17/22   Medina-Vargas, Monina C, NP  isosorbide mononitrate (IMDUR) 30 MG 24 hr tablet Take 0.5 tablets (15 mg total) by mouth daily. 11/22/22   Barton Dubois, MD  lidocaine (XYLOCAINE) 5 % ointment Apply 1 Application topically 4 (four) times daily as needed. Apply to the perianal area Patient not taking:  Reported on 01/17/2023 10/28/22   Olean Ree, MD  magnesium oxide (MAG-OX) 400 (240 Mg) MG tablet Take 1 tablet (400 mg total) by mouth 2 (two) times daily. 11/06/22   Enzo Bi, MD  methocarbamol (ROBAXIN) 750 MG tablet TAKE 1 TABLET (750 MG TOTAL) BY MOUTH DAILY AS NEEDED FOR MUSCLE SPASMS 12/23/22   Medina-Vargas, Monina C, NP  metoprolol tartrate (LOPRESSOR) 25 MG tablet Take 12.5 mg by mouth 2 (two) times daily.    Enzo Bi, MD  midodrine (PROAMATINE) 2.5 MG tablet Take 1 tablet (2.5 mg total) by mouth 3 (three) times daily with meals. Patient not taking: Reported on 01/17/2023 11/24/22   Barton Dubois, MD  Multiple Vitamins-Minerals (MULTIVITAMIN) tablet Take 1 tablet by mouth daily. 11/06/22 02/04/23  Enzo Bi, MD  nicotine (NICODERM CQ - DOSED IN MG/24 HOURS) 14 mg/24hr patch Place 1 patch (14 mg total) onto the skin daily. 11/21/22   Barton Dubois, MD  nitroGLYCERIN (NITROSTAT) 0.4 MG SL tablet Place 1 tablet (0.4 mg total) under the tongue every 5 (five) minutes as needed for chest pain. 11/06/22   Enzo Bi, MD  pantoprazole (PROTONIX) 40 MG tablet TAKE 1 TABLET BY MOUTH EVERY DAY Patient taking differently: Take 40 mg by mouth 2 (two) times daily. 12/23/22   Medina-Vargas, Monina C, NP  polyethylene glycol (MIRALAX / GLYCOLAX) 17 g packet Take 17 g by mouth daily. 11/06/22 02/04/23  Enzo Bi, MD  psyllium (HYDROCIL/METAMUCIL) 95 % PACK Take 1 packet by mouth daily. 11/06/22 02/04/23  Enzo Bi, MD  rosuvastatin (CRESTOR) 40 MG tablet TAKE 1 TABLET BY MOUTH EVERY DAY 12/19/22   Medina-Vargas, Monina C, NP  thiamine (VITAMIN B-1) 100 MG tablet Take 1 tablet (100 mg total) by mouth daily. 11/06/22   Enzo Bi, MD    Allergies: Allergies  Allergen Reactions   Penicillins Hives    Tolerated Ceftriaxone 10/2022   Other Rash    chives    Social History:  reports that he has been smoking cigarettes. He has a 20.50 pack-year smoking history. He has never used smokeless tobacco. He reports  current alcohol use of about 12.0 standard drinks of alcohol per week. He reports that he does not currently use drugs after having used the following drugs: Cocaine and Marijuana.   Family History: Family History  Problem Relation Age of Onset   Coronary artery disease Mother        alive   Other Other        2 sisters alive and well   Alcohol abuse Father        died @ 45   Other Paternal Grandmother        PACER    Review of Systems: Review of Systems  Constitutional:  Negative for chills and fever.  Respiratory:  Negative for shortness of breath.   Cardiovascular:  Negative for chest pain.  Gastrointestinal:  Positive for blood in stool and constipation. Negative for abdominal pain, nausea and vomiting.  Genitourinary:  Negative for dysuria.  Skin:  Negative for rash.    Physical Exam BP (!) 93/51 (BP Location: Left Arm)   Pulse 91   Temp 97.9 F (36.6 C) (Oral)   Resp 13   Ht '5\' 5"'$  (1.651 m)   Wt 101.7 kg   SpO2 96%   BMI 37.31 kg/m  CONSTITUTIONAL: No acute distress HEENT:  Normocephalic, atraumatic, extraocular motion intact. NECK: Trachea is midline, and there is no jugular venous distension. RESPIRATORY:  Normal respiratory effort without pathologic use of accessory muscles. CARDIOVASCULAR: Regular rhythm and rate, currently on norepi. RECTAL:  Patient declined digital rectal exam.  Externally, external portion of his hemorrhoidectomy is fully healed, without any open wounds or fissures.  No gross blood noted externally. MUSCULOSKELETAL:  Normal muscle strength and tone in all four extremities.  No peripheral edema or cyanosis. SKIN: Skin turgor is normal. There are no pathologic skin lesions.  NEUROLOGIC:  Motor and sensation is grossly normal.  Cranial nerves are grossly intact. PSYCH:  Alert and oriented to person, place and time. Affect is normal.  Laboratory Analysis: Results for orders placed or performed during the hospital encounter of 01/16/23 (from  the past 24 hour(s))  Brain natriuretic peptide     Status: Abnormal   Collection Time: 01/16/23 11:47 PM  Result Value Ref Range   B Natriuretic Peptide 1,052.4 (H) 0.0 - 100.0 pg/mL  Comprehensive metabolic panel     Status: Abnormal  Collection Time: 01/16/23 11:47 PM  Result Value Ref Range   Sodium 128 (L) 135 - 145 mmol/L   Potassium 2.8 (L) 3.5 - 5.1 mmol/L   Chloride 94 (L) 98 - 111 mmol/L   CO2 20 (L) 22 - 32 mmol/L   Glucose, Bld 109 (H) 70 - 99 mg/dL   BUN 20 6 - 20 mg/dL   Creatinine, Ser 1.45 (H) 0.61 - 1.24 mg/dL   Calcium 7.6 (L) 8.9 - 10.3 mg/dL   Total Protein 6.8 6.5 - 8.1 g/dL   Albumin 2.6 (L) 3.5 - 5.0 g/dL   AST 99 (H) 15 - 41 U/L   ALT 24 0 - 44 U/L   Alkaline Phosphatase 271 (H) 38 - 126 U/L   Total Bilirubin 1.5 (H) 0.3 - 1.2 mg/dL   GFR, Estimated 59 (L) >60 mL/min   Anion gap 14 5 - 15  CBC with Differential     Status: Abnormal   Collection Time: 01/16/23 11:47 PM  Result Value Ref Range   WBC 9.0 4.0 - 10.5 K/uL   RBC 2.26 (L) 4.22 - 5.81 MIL/uL   Hemoglobin 6.3 (L) 13.0 - 17.0 g/dL   HCT 20.0 (L) 39.0 - 52.0 %   MCV 88.5 80.0 - 100.0 fL   MCH 27.9 26.0 - 34.0 pg   MCHC 31.5 30.0 - 36.0 g/dL   RDW 18.5 (H) 11.5 - 15.5 %   Platelets 213 150 - 400 K/uL   nRBC 0.0 0.0 - 0.2 %   Neutrophils Relative % 67 %   Neutro Abs 6.1 1.7 - 7.7 K/uL   Lymphocytes Relative 20 %   Lymphs Abs 1.8 0.7 - 4.0 K/uL   Monocytes Relative 11 %   Monocytes Absolute 1.0 0.1 - 1.0 K/uL   Eosinophils Relative 1 %   Eosinophils Absolute 0.1 0.0 - 0.5 K/uL   Basophils Relative 0 %   Basophils Absolute 0.0 0.0 - 0.1 K/uL   Immature Granulocytes 1 %   Abs Immature Granulocytes 0.05 0.00 - 0.07 K/uL  Troponin I (High Sensitivity)     Status: Abnormal   Collection Time: 01/16/23 11:47 PM  Result Value Ref Range   Troponin I (High Sensitivity) 431 (HH) <18 ng/L  Urinalysis, Routine w reflex microscopic -Urine, Clean Catch     Status: Abnormal   Collection Time: 01/16/23  11:47 PM  Result Value Ref Range   Color, Urine AMBER (A) YELLOW   APPearance HAZY (A) CLEAR   Specific Gravity, Urine 1.015 1.005 - 1.030   pH 5.0 5.0 - 8.0   Glucose, UA NEGATIVE NEGATIVE mg/dL   Hgb urine dipstick NEGATIVE NEGATIVE   Bilirubin Urine NEGATIVE NEGATIVE   Ketones, ur NEGATIVE NEGATIVE mg/dL   Protein, ur 30 (A) NEGATIVE mg/dL   Nitrite NEGATIVE NEGATIVE   Leukocytes,Ua NEGATIVE NEGATIVE   RBC / HPF 0-5 0 - 5 RBC/hpf   WBC, UA 11-20 0 - 5 WBC/hpf   Bacteria, UA NONE SEEN NONE SEEN   Squamous Epithelial / HPF 0-5 0 - 5 /HPF   Mucus PRESENT    Hyaline Casts, UA PRESENT   Urine Drug Screen, Qualitative ([facility] only)     Status: Abnormal   Collection Time: 01/16/23 11:47 PM  Result Value Ref Range   Tricyclic, Ur Screen NONE DETECTED NONE DETECTED   Amphetamines, Ur Screen NONE DETECTED NONE DETECTED   MDMA (Ecstasy)Ur Screen NONE DETECTED NONE DETECTED   Cocaine Metabolite,Ur [place] POSITIVE (A) NONE DETECTED  Opiate, Ur Screen NONE DETECTED NONE DETECTED   Phencyclidine (PCP) Ur S NONE DETECTED NONE DETECTED   Cannabinoid 50 Ng, Ur [place] NONE DETECTED NONE DETECTED   Barbiturates, Ur Screen NONE DETECTED NONE DETECTED   Benzodiazepine, Ur Scrn POSITIVE (A) NONE DETECTED   Methadone Scn, Ur NONE DETECTED NONE DETECTED  Lactic acid, plasma     Status: Abnormal   Collection Time: 01/16/23 11:47 PM  Result Value Ref Range   Lactic Acid, Venous 5.1 (HH) 0.5 - 1.9 mmol/L  Type and screen Bertha     Status: None (Preliminary result)   Collection Time: 01/17/23 12:43 AM  Result Value Ref Range   ABO/RH(D) PENDING    Antibody Screen PENDING    Sample Expiration      01/20/2023,2359 Performed at [facility], [address] [place], [place] [address]   Protime-INR     Status: Abnormal   Collection Time: 01/17/23 12:43 AM  Result Value Ref Range   Prothrombin Time 16.0 (H) 11.4 - 15.2 seconds   INR 1.3 (H) 0.8 - 1.2  APTT     Status:  Abnormal   Collection Time: 01/17/23 12:43 AM  Result Value Ref Range   aPTT 37 (H) 24 - 36 seconds  Blood Culture (routine x 2)     Status: None (Preliminary result)   Collection Time: 01/17/23 12:43 AM   Specimen: BLOOD LEFT ARM  Result Value Ref Range   Specimen Description BLOOD LEFT ARM    Special Requests      BOTTLES DRAWN AEROBIC AND ANAEROBIC Blood Culture adequate volume   Culture      NO GROWTH < 12 HOURS Performed at [facility], [address] [place], [place] [address]    Report Status PENDING   Lipase, blood     Status: Abnormal   Collection Time: 01/17/23 12:43 AM  Result Value Ref Range   Lipase 78 (H) 11 - 51 U/L  Blood Culture (routine x 2)     Status: None (Preliminary result)   Collection Time: 01/17/23 12:44 AM   Specimen: BLOOD  Result Value Ref Range   Specimen Description BLOOD RIGHT WRIST    Special Requests      BOTTLES DRAWN AEROBIC AND ANAEROBIC Blood Culture adequate volume   Culture      NO GROWTH < 12 HOURS Performed at [facility], [address], [place], [place] [address]    Report Status PENDING   Prepare RBC (crossmatch)     Status: None   Collection Time: 01/17/23 12:58 AM  Result Value Ref Range   Order Confirmation      ORDER PROCESSED BY BLOOD BANK Performed at [facility], [address] [place], [place] [address]   Troponin I (High Sensitivity)     Status: Abnormal   Collection Time: 01/17/23  1:26 AM  Result Value Ref Range   Troponin I (High Sensitivity) 436 (HH) <18 ng/L  Magnesium     Status: None   Collection Time: 01/17/23  1:26 AM  Result Value Ref Range   Magnesium 1.7 1.7 - 2.4 mg/dL  Phosphorus     Status: None   Collection Time: 01/17/23  1:26 AM  Result Value Ref Range   Phosphorus 2.5 2.5 - 4.6 mg/dL  Lactic acid, plasma     Status: Abnormal   Collection Time: 01/17/23  1:34 AM  Result Value Ref Range   Lactic Acid, Venous 3.2 (HH) 0.5 - 1.9  mmol/L  Type and screen Ordered  by PROVIDER DEFAULT     Status: None (Preliminary result)   Collection Time: 01/17/23  1:34 AM  Result Value Ref Range   ABO/RH(D) A POS    Antibody Screen NEG    Sample Expiration 01/20/2023,2359    Unit Number [id number]    Blood Component Type RED CELLS,LR    Unit division 00    Status of Unit ALLOCATED    Transfusion Status OK TO TRANSFUSE    Crossmatch Result Compatible    Unit Number [id number]    Blood Component Type RED CELLS,LR    Unit division 00    Status of Unit ISSUED    Transfusion Status OK TO TRANSFUSE    Crossmatch Result      Compatible Performed at [facility], [address] [place], [place] [address]    Unit Number [id number]    Blood Component Type RED CELLS,LR    Unit division 00    Status of Unit ISSUED    Transfusion Status OK TO TRANSFUSE    Crossmatch Result Compatible   Prepare RBC (crossmatch)     Status: None   Collection Time: 01/17/23  3:27 AM  Result Value Ref Range   Order Confirmation      DUPLICATE REQUEST BB SAMPLE OR UNITS ALREADY AVAILABLE Performed at [facility], [address] [place], [place] [address]   Glucose, capillary     Status: Abnormal   Collection Time: 01/17/23  4:22 AM  Result Value Ref Range   Glucose-Capillary 108 (H) 70 - 99 mg/dL  MRSA Next Gen by PCR, Nasal     Status: None   Collection Time: 01/17/23  4:49 AM   Specimen: Nasal Mucosa; Nasal Swab  Result Value Ref Range   MRSA by PCR Next Gen NOT DETECTED NOT DETECTED    Imaging: US Venous Img Lower Bilateral (DVT)  Result Date: 01/17/2023 CLINICAL DATA:  CHF EXAM: BILATERAL LOWER EXTREMITY VENOUS DOPPLER ULTRASOUND TECHNIQUE: Gray-scale sonography with compression, as well as color and duplex ultrasound, were performed to evaluate the deep venous system(s) from the level of the common femoral vein through the popliteal and proximal calf veins. COMPARISON:  None Available. FINDINGS: VENOUS Normal compressibility of the common  femoral, superficial femoral, and popliteal veins, as well as the visualized calf veins. Visualized portions of profunda femoral vein and great saphenous vein unremarkable. No filling defects to suggest DVT on grayscale or color Doppler imaging. Doppler waveforms show normal direction of venous flow, normal respiratory plasticity and response to augmentation. Subcutaneous edema in the bilateral lower legs. IMPRESSION: Negative for DVT in the lower extremities. Electronically Signed   By: Jorje Guild M.D.   On: 01/17/2023 06:56   CT ANGIO GI BLEED  Result Date: 01/17/2023 CLINICAL DATA:  Acute blood loss anemia with rectal bleeding EXAM: CTA ABDOMEN AND PELVIS WITHOUT AND WITH CONTRAST TECHNIQUE: Multidetector CT imaging of the abdomen and pelvis was performed using the standard protocol during bolus administration of intravenous contrast. Multiplanar reconstructed images and MIPs were obtained and reviewed to evaluate the vascular anatomy. RADIATION DOSE REDUCTION: This exam was performed according to the departmental dose-optimization program which includes automated exposure control, adjustment of the mA and/or kV according to patient size and/or use of iterative reconstruction technique. CONTRAST:  164m OMNIPAQUE IOHEXOL 350 MG/ML SOLN COMPARISON:  None Available. FINDINGS: VASCULAR Aorta: Normal caliber aorta without aneurysm, dissection, vasculitis or significant stenosis. Scattered calcified atherosclerotic plaque. Celiac: Severe focal stenosis  at the origin of the celiac artery similar to 09/30/2022. SMA: Interval placement of a stent at the origin of the SMA. The stent is patent. Renals: Both renal arteries are patent without evidence of aneurysm, dissection, vasculitis, fibromuscular dysplasia or significant stenosis. IMA: Patent without evidence of aneurysm, dissection, vasculitis or significant stenosis. Inflow: Patent without evidence of aneurysm, dissection, vasculitis or significant stenosis.  Proximal Outflow: Bilateral common femoral and visualized portions of the superficial and profunda femoral arteries are patent without evidence of aneurysm, dissection, vasculitis or significant stenosis. Veins: Patent portal vein and IVC. Review of the MIP images confirms the above findings. NON-VASCULAR Lower chest: No acute abnormality. Hepatobiliary: Cirrhosis. Nondistended gallbladder. Gallbladder wall thickening and pericholecystic fluid favored related to cirrhosis/volume status. No biliary dilation. Pancreas: Unremarkable. Spleen: The spleen measures 14.5 cm in craniocaudal dimension. There is at the upper limits of normal in size. Adrenals/Urinary Tract: Adrenal glands are unremarkable. Kidneys are normal, without renal calculi, focal lesion, or hydronephrosis. Bladder is unremarkable. Stomach/Bowel: There is a focal hyperdensity on arterial phase images (series 11/image 1041) which diffuse is on portal venous phase and is not present on noncontrast images. This is compatible with small focus of active bleed. Hyperdense stool is present throughout the colon on noncontrast images and limits assessment for additional active bleeding. Normal caliber large and small bowel. Wall thickening about the sigmoid colon and rectum. Additional wall thickening versus underdistention of the ascending colon. Wall thickening and adjacent stranding and fluid about the duodenum. Lymphatic: No suspicious lymphadenopathy. Reproductive: Unremarkable. Other: Small amount of free fluid in the pelvis. Central mesenteric edema. No free intraperitoneal air. Musculoskeletal: No acute osseous abnormality. IMPRESSION: VASCULAR Findings suspicious for small active bleed in the rectum. Severe stenosis at the origin of the celiac axis is redemonstrated. Patent stent at the origin of the SMA. NON-VASCULAR Wall thickening about the sigmoid colon/rectum as well as the transverse colon favored due to colitis however congestive colopathy could  appear similarly. Duodenal wall thickening suggesting duodenitis. Cirrhosis and borderline splenomegaly compatible with portal hypertension. These results were called by telephone at the time of interpretation on 01/17/2023 at 3:19 am to provider [facility] , who verbally acknowledged these results. Electronically Signed   By: Placido Sou M.D.   On: 01/17/2023 03:25   DG Chest Portable 1 View  Result Date: 01/16/2023 CLINICAL DATA:  Volume overload EXAM: PORTABLE CHEST 1 VIEW COMPARISON:  12/12/2022 FINDINGS: Cardiac shadow is enlarged but stable. No significant vascular congestion is noted. No edema is seen. The lungs are clear. No bony abnormality is noted. IMPRESSION: No active disease. Electronically Signed   By: Inez Catalina M.D.   On: 01/16/2023 23:42    Assessment and Plan: This is a 52 y.o. male with bleeding per rectum.  --Discussed with the patient that the CT scan does not show any large or significant amount of active bleeding.  It is hard to see the blush of contrast in the distal rectum/anal canal.  I think his anemia is likely a slow bleed rather than acute heavy bleeding.  Unfortunately, his health issues are also contributing to this.  He is not compliant with medications and has cirrhosis with portal hypertension findings, drinks heavily, uses cocaine, and continues to strain with bowel movements.  At this point, no acute surgical intervention is needed.  Would continue to monitor for now given that it's a slow bleed.  Reinforced with the patient the importance of avoiding straining/constipation as well as drinking/drugs.  Recommend bowel  regimen with stool softener or miralax and high fiber diet.  Patient does not want any surgical intervention with me.  At this point, no surgery is needed either. --Will sign off for now, but feel free to reach [place] if any questions or concerns.  I spent 45 minutes dedicated to the care of this patient on the date of this encounter to include  pre-visit review of records, face-to-face time with the patient discussing diagnosis and management, and any post-visit coordination of care.   Melvyn Neth, MD [facility] Pg:  [phone]

## 2023-01-17 NOTE — ED Notes (Signed)
ICU provider in with patient.

## 2023-01-17 NOTE — ED Notes (Signed)
IV nurse at the bedside

## 2023-01-17 NOTE — Assessment & Plan Note (Deleted)
Appreciate cardiology help.  Elevated troponins felt to be less likely due to acute non-STEMI more likely due to demand mismatch from blood loss anemia.  No beta-blocker for cocaine use.  Aspirin on hold due to anemia.  Continue to monitor.

## 2023-01-17 NOTE — Assessment & Plan Note (Signed)
Will counsel to quit

## 2023-01-17 NOTE — Assessment & Plan Note (Signed)
Meets criteria with BMI greater than 35 and comorbidities of CAD, CHF

## 2023-01-17 NOTE — Assessment & Plan Note (Addendum)
Suspect high-output heart failure from ongoing blood loss.,  Echocardiogram results noted ejection fraction 50 to 55% with indeterminate diastolic function.  Diuresed earlier in the hospital course.

## 2023-01-17 NOTE — Assessment & Plan Note (Signed)
Blood pressure medications on hold due to hypotension from blood loss.  Off of pressor support, have added midodrine.

## 2023-01-17 NOTE — Assessment & Plan Note (Deleted)
Appreciate cardiology help.  Elevated troponins felt to be less likely due to acute non-STEMI more likely due to demand mismatch from blood loss anemia.  No beta-blocker for cocaine use.  Aspirin on hold due to anemia.  Continue to monitor. 

## 2023-01-17 NOTE — Discharge Instructions (Addendum)
Low Sodium Nutrition Therapy  Eating less sodium can help you if you have high blood pressure, heart failure, or kidney or liver disease.   Your body needs a little sodium, but too much sodium can cause your body to hold onto extra water. This extra water will raise your blood pressure and can cause damage to your heart, kidneys, or liver as they are forced to work harder.   Sometimes you can see how the extra fluid affects you because your hands, legs, or belly swell. You may also hold water around your heart and lungs, which makes it hard to breathe.   Even if you take medication for blood pressure or a water pill (diuretic) to remove fluid, it is still important to have less salt in your diet.   Check with your primary care provider before drinking alcohol since it may affect the amount of fluid in your body and how your heart, kidneys, or liver work. Sodium in Food A low-sodium meal plan limits the sodium that you get from food and beverages to 1,500-2,000 milligrams (mg) per day. Salt is the main source of sodium. Read the nutrition label on the package to find out how much sodium is in one serving of a food.  Select foods with 140 milligrams (mg) of sodium or less per serving.  You may be able to eat one or two servings of foods with a little more than 140 milligrams (mg) of sodium if you are closely watching how much sodium you eat in a day.  Check the serving size on the label. The amount of sodium listed on the label shows the amount in one serving of the food. So, if you eat more than one serving, you will get more sodium than the amount listed.  Tips Cutting Back on Sodium Eat more fresh foods.  Fresh fruits and vegetables are low in sodium, as well as frozen vegetables and fruits that have no added juices or sauces.  Fresh meats are lower in sodium than processed meats, such as bacon, sausage, and hotdogs.  Not all processed foods are unhealthy, but some processed foods may have too  much sodium.  Eat less salt at the table and when cooking. One of the ingredients in salt is sodium.  One teaspoon of table salt has 2,300 milligrams of sodium.  Leave the salt out of recipes for pasta, casseroles, and soups. Be a smart shopper.  Food packages that say "Salt-free", sodium-free", "very low sodium," and "low sodium" have less than 140 milligrams of sodium per serving.  Beware of products identified as "Unsalted," "No Salt Added," "Reduced Sodium," or "Lower Sodium." These items may still be high in sodium. You should always check the nutrition label. Add flavors to your food without adding sodium.  Try lemon juice, lime juice, or vinegar.  Dry or fresh herbs add flavor.  Buy a sodium-free seasoning blend or make your own at home. You can purchase salt-free or sodium-free condiments like barbeque sauce in stores and online. Ask your registered dietitian nutritionist for recommendations and where to find them.   Eating in Restaurants Choose foods carefully when you eat outside your home. Restaurant foods can be very high in sodium. Many restaurants provide nutrition facts on their menus or their websites. If you cannot find that information, ask your server. Let your server know that you want your food to be cooked without salt and that you would like your salad dressing and sauces to be served on the   side.    Foods Recommended Food Group Foods Recommended  Grains Bread, bagels, rolls without salted tops Homemade bread made with reduced-sodium baking powder Cold cereals, especially shredded wheat and puffed rice Oats, grits, or cream of wheat Pastas, quinoa, and rice Popcorn, pretzels or crackers without salt Corn tortillas  Protein Foods Fresh meats and fish; turkey bacon (check the nutrition labels - make sure they are not packaged in a sodium solution) Canned or packed tuna (no more than 4 ounces at 1 serving) Beans and peas Soybeans) and tofu Eggs Nuts or nut butters  without salt  Dairy Milk or milk powder Plant milks, such as rice and soy Yogurt, including Greek yogurt Small amounts of natural cheese (blocks of cheese) or reduced-sodium cheese can be used in moderation. (Swiss, ricotta, and fresh mozzarella cheese are lower in sodium than the others) Cream Cheese Low sodium cottage cheese  Vegetables Fresh and frozen vegetables without added sauces or salt Homemade soups (without salt) Low-sodium, salt-free or sodium-free canned vegetables and soups  Fruit Fresh and canned fruits Dried fruits, such as raisins, cranberries, and prunes  Oils Tub or liquid margarine, regular or without salt Canola, corn, peanut, olive, safflower, or sunflower oils  Condiments Fresh or dried herbs such as basil, bay leaf, dill, mustard (dry), nutmeg, paprika, parsley, rosemary, sage, or thyme.  Low sodium ketchup Vinegar  Lemon or lime juice Pepper, red pepper flakes, and cayenne. Hot sauce contains sodium, but if you use just a drop or two, it will not add up to much.  Salt-free or sodium-free seasoning mixes and marinades Simple salad dressings: vinegar and oil   Foods Not Recommended Food Group Foods Not Recommended  Grains Breads or crackers topped with salt Cereals (hot/cold) with more than 300 mg sodium per serving Biscuits, cornbread, and other "quick" breads prepared with baking soda Pre-packaged bread crumbs Seasoned and packaged rice and pasta mixes Self-rising flours  Protein Foods Cured meats: Bacon, ham, sausage, pepperoni and hot dogs Canned meats (chili, vienna sausage, or sardines) Smoked fish and meats Frozen meals that have more than 600 mg of sodium per serving Egg substitute (with added sodium)  Dairy Buttermilk Processed cheese spreads Cottage cheese (1 cup may have over 500 mg of sodium; look for low-sodium.) American or feta cheese Shredded Cheese has more sodium than blocks of cheese String cheese  Vegetables Canned vegetables  (unless they are salt-free, sodium-free or low sodium) Frozen vegetables with seasoning and sauces Sauerkraut and pickled vegetables Canned or dried soups (unless they are salt-free, sodium-free, or low sodium) French fries and onion rings  Fruit Dried fruits preserved with additives that have sodium  Oils Salted butter or margarine, all types of olives  Condiments Salt, sea salt, kosher salt, onion salt, and garlic salt Seasoning mixes with salt Bouillon cubes Ketchup Barbeque sauce and Worcestershire sauce unless low sodium Soy sauce Salsa, pickles, olives, relish Salad dressings: ranch, blue cheese, Italian, and French.   Low Sodium Sample 1-Day Menu  Breakfast 1 cup cooked oatmeal  1 slice whole wheat bread toast  1 tablespoon peanut butter without salt  1 banana  1 cup 1% milk  Lunch Tacos made with: 2 corn tortillas   cup black beans, low sodium   cup roasted or grilled chicken (without skin)   avocado  Squeeze of lime juice  1 cup salad greens  1 tablespoon low-sodium salad dressing   cup strawberries  1 orange  Afternoon Snack 1/3 cup grapes  6 ounces yogurt    Evening Meal 3 ounces herb-baked fish  1 baked potato  2 teaspoons olive oil   cup cooked carrots  2 thick slices tomatoes on:  2 lettuce leaves  1 teaspoon olive oil  1 teaspoon balsamic vinegar  1 cup 1% milk  Evening Snack 1 apple   cup almonds without salt   Low-Sodium Vegetarian (Lacto-Ovo) Sample 1-Day Menu  Breakfast 1 cup cooked oatmeal  1 slice whole wheat toast  1 tablespoon peanut butter without salt  1 banana  1 cup 1% milk  Lunch Tacos made with: 2 corn tortillas   cup black beans, low sodium   cup roasted or grilled chicken (without skin)   avocado  Squeeze of lime juice  1 cup salad greens  1 tablespoon low-sodium salad dressing   cup strawberries  1 orange  Evening Meal Stir fry made with:  cup tofu  1 cup brown rice   cup broccoli   cup green beans   cup  peppers   tablespoon peanut oil  1 orange  1 cup 1% milk  Evening Snack 4 strips celery  2 tablespoons hummus  1 hard-boiled egg   Low-Sodium Vegan Sample 1-Day Menu  Breakfast 1 cup cooked oatmeal  1 tablespoon peanut butter without salt  1 cup blueberries  1 cup soymilk fortified with calcium, vitamin B12, and vitamin D  Lunch 1 small whole wheat pita   cup cooked lentils  2 tablespoons hummus  4 carrot sticks  1 medium apple  1 cup soymilk fortified with calcium, vitamin B12, and vitamin D  Evening Meal Stir fry made with:  cup tofu  1 cup brown rice   cup broccoli   cup green beans   cup peppers   tablespoon peanut oil  1 cup cantaloupe  Evening Snack 1 cup soy yogurt   cup mixed nuts  Copyright 2020  Academy of Nutrition and Dietetics. All rights reserved  Sodium Free Flavoring Tips  When cooking, the following items may be used for flavoring instead of salt or seasonings that contain sodium. Remember: A little bit of spice goes a long way! Be careful not to overseason. Spice Blend Recipe (makes about ? cup) 5 teaspoons onion powder  2 teaspoons garlic powder  2 teaspoons paprika  2 teaspoon dry mustard  1 teaspoon crushed thyme leaves   teaspoon white pepper   teaspoon celery seed Food Item Flavorings  Beef Basil, bay leaf, caraway, curry, dill, dry mustard, garlic, grape jelly, green pepper, mace, marjoram, mushrooms (fresh), nutmeg, onion or onion powder, parsley, pepper, rosemary, sage  Chicken Basil, cloves, cranberries, mace, mushrooms (fresh), nutmeg, oregano, paprika, parsley, pineapple, saffron, sage, savory, tarragon, thyme, tomato, turmeric  Egg Chervil, curry, dill, dry mustard, garlic or garlic powder, green pepper, jelly, mushrooms (fresh), nutmeg, onion powder, paprika, parsley, rosemary, tarragon, tomato  Fish Basil, bay leaf, chervil, curry, dill, dry mustard, green pepper, lemon juice, marjoram, mushrooms (fresh), paprika, pepper,  tarragon, tomato, turmeric  Lamb Cloves, curry, dill, garlic or garlic powder, mace, mint, mint jelly, onion, oregano, parsley, pineapple, rosemary, tarragon, thyme  Pork Applesauce, basil, caraway, chives, cloves, garlic or garlic powder, onion or onion powder, rosemary, thyme  Veal Apricots, basil, bay leaf, currant jelly, curry, ginger, marjoram, mushrooms (fresh), oregano, paprika  Vegetables Basil, dill, garlic or garlic powder, ginger, lemon juice, mace, marjoram, nutmeg, onion or onion powder, tarragon, tomato, sugar or sugar substitute, salt-free salad dressing, vinegar  Desserts Allspice, anise, cinnamon, cloves, ginger, mace, nutmeg, vanilla extract, other   extracts   Copyright 2020  Academy of Nutrition and Dietetics. All rights reserved  Fluid Restricted Nutrition Therapy  You have been prescribed this diet because your condition affects how much fluid you can eat or drink. If your heart, liver, or kidneys aren't working properly, you may not be able to effectively eliminate fluids from the body and this may cause swelling (edema) in the legs, arms, and/or stomach. Drink no more than _________ liters or ________ ounces or ________cups of fluid per day.  You don't need to stop eating or drinking the same fluids you normally would, but you may need to eat or drink less than usual.  Your registered dietitian nutritionist will help you determine the correct amount of fluid to consume during the day Breakfast Include fluids taken with medications  Lunch Include fluids taken with medications  Dinner Include fluids taken with medications  Bedtime Snack Include fluids taken with medications     Tips What Are Fluids?  A fluid is anything that is liquid or anything that would melt if left at room temperature. You will need to count these foods and liquids--including any liquid used to take medication--as part of your daily fluid intake. Some examples are: Alcohol (drink only with your  doctor's permission)  Coffee, tea, and other hot beverages  Gelatin (Jell-O)  Gravy  Ice cream, sherbet, sorbet  Ice cubes, ice chips  Milk, liquid creamer  Nutritional supplements  Popsicles  Vegetable and fruit juices; fluid in canned fruit  Watermelon  Yogurt  Soft drinks, lemonade, limeade  Soups  Syrup How Do I Measure My Fluid Intake? Record your fluid intake daily.  Tip: Every day, each time you eat or drink fluids, pour water in the same amount into an empty container that can hold the same amount of fluids you are allowed daily. This may help you keep track of how much fluid you are taking in throughout the day.  To accurately keep track of how much liquid you take in, measure the size of the cups, glasses, and bowls you use. If you eat soup, measure how much of it is liquid and how much is solid (such as noodles, vegetables, meat). Conversions for Measuring Fluid Intake  Milliliters (mL) Liters (L) Ounces (oz) Cups (c)  1000 1 32 4  1200 1.2 40 5  1500 1.5 50 6 1/4  1800 1.8 60 7 1/2  2000 2 67 8 1/3  Tips to Reduce Your Thirst Chew gum or suck on hard candy.  Rinse or gargle with mouthwash. Do not swallow.  Ice chips or popsicles my help quench thirst, but this too needs to be calculated into the total restriction. Melt ice chips or cubes first to figure out how much fluid they produce (for example, experiment with melting  cup ice chips or 2 ice cubes).  Add a lemon wedge to your water.  Limit how much salt you take in. A high salt intake might make you thirstier.  Don't eat or drink all your allowed liquids at once. Space your liquids out through the day.  Use small glasses and cups and sip slowly. If allowed, take your medications with fluids you eat or drink during a meal.   Fluid-Restricted Nutrition Therapy Sample 1-Day Menu  Breakfast 1 slice wheat toast  1 tablespoon peanut butter  1/2 cup yogurt (120 milliliters)  1/2 cup blueberries  1 cup milk (240  milliliters)   Lunch 3 ounces sliced turkey  2 slices whole wheat   bread  1/2 cup lettuce for sandwich  2 slices tomato for sandwich  1 ounce reduced-fat, reduced-sodium cheese  1/2 cup fresh carrot sticks  1 banana  1 cup unsweetened tea (240 milliliters)   Evening Meal 8 ounces soup (240 milliliters)  3 ounces salmon  1/2 cup quinoa  1 cup green beans  1 cup mixed greens salad  1 tablespoon olive oil  1 cup coffee (240 milliliters)  Evening Snack 1/2 cup sliced peaches  1/2 cup frozen yogurt (120 milliliters)  1 cup water (240 milliliters)  Copyright 2020  Academy of Nutrition and Dietetics. All rights reserved      Intensive Outpatient Programs   [address] [place],  [place], [place] (Inpatient and outpatient)[phone] (Suboxone and Methadone) [address] [phone]  [facility]: [facility] Programs - Intensive Outpatient [place] [phone] [place], [place], [place] (Outpatient, Inpatient, [facility] (Groups and [place]idental) (insurance only) [place], [place]   [facility] (for caregivers and family) [address]een [address], [place], [place]  Residential Treatment Programs  [facility](2 years) Residential: 68 days)[facility] ([place], [place], [place] or [phone]  [facility] [place], [place], [place]  [place] (RTS) [place], [place], [place] Admissions: 8am-3pm M-F  [facility] Program: Residential Program [phone] Days)             [facility]: [facility]  [place], [place], [place] or [phone] in Hours over the weekend or by referral)   [place] Crisis: [facility]:[phone] (for crisis response 24 hours a day)   F/u Dr Rockey Situ [facility] cardiology in 1 week  Pt advised to stop drinking ETOH and doing street drugs

## 2023-01-17 NOTE — Progress Notes (Signed)
[facility] Progress Note Patient Name: Ethan Wells DOB: 1971/12/16 MRN: [id number]   Date of Service  01/17/2023  HPI/Events of Note  Patient admitted with GI bleeding, acute blood loss anemia, hypotension, congestive heart failure, against a background of cardiomyopathy with systolic heart dysfunction.  eICU Interventions  New Patient Evaluation.        Kerry Kass [place] Ord 01/17/2023, 5:18 AM

## 2023-01-17 NOTE — ED Notes (Signed)
Consent obtained for blood transfusion.  

## 2023-01-17 NOTE — Consult Note (Addendum)
Cardiology Consult    Patient ID: Ethan Wells MRN: [id number], DOB/AGE: 51/03/1971   Admit date: 01/16/2023 Date of Consult: 01/17/2023  Primary Physician: Nickola Major, NP Primary Cardiologist: Lauree Chandler, MD Requesting Provider: Lyman Speller, MD  Patient Profile    Ethan Wells is a 52 y.o. male with a history of CAD s/p STEMI & VF arrest in 01/2011 w/ LAD stenting, NSTEMI 10/2022, HTN, HL, ICM w/ improved EF, polysubstance abuse, hemorrhoidal bleeding w/ anemia, and mesenteric dzs s/p SMA stenting, who is being seen today for the evaluation of elevated HsTrop and CHF in the setting of recurrent rectal bleeding and anemia at the request of Dr. Maryland Pink.  Past Medical History   Past Medical History:  Diagnosis Date   CAD (coronary artery disease)    a. 01/2011 Anterior STEMI/Cath/PCI: LM nl, LAD 100d (3.5x56m Vision BMS placed), LCX 258mRI 50, RCA min irregs, EF 40% w/ apical, inferoapical HK; b. 10/2022 NSTSEMI/Cath: LM nl, LAD 5p/m, RI 85, LCX 1001mM1 mild dzs, OM3 100 fills via L->L collats from dLAD, RCA 80p, 100m97mDA fills via collats from LAD. EF 45-50%-->Med Rx.   Cardiac arrest - ventricular fibrillation    a. In setting of STEMI 01/2011   Chronic pain    Cocaine abuse (HCC)[place] Depression with anxiety    ETOH abuse    a. 6-12 beers/day   GERD (gastroesophageal reflux disease)    GI bleed    Hemorrhoids    Hyperlipidemia    Hypertension    Ischemic cardiomyopathy    a. 06/2011 Echo: EF 45-50%, No rwma; b. 10/2022 Echo: EF 55-60%, no rwma, nl RV fxn.   Marijuana abuse    Migraines    Tobacco abuse    a. 1/2 ppd x 26 yrs    Past Surgical History:  Procedure Laterality Date   COLONOSCOPY  08/09/2021   Procedure: COLONOSCOPY;  Surgeon: WohlLucilla Lame;  [facility];  Service: Endoscopy;;   CORONARY ANGIOPLASTY WITH STENT PLACEMENT     ESOPHAGOGASTRODUODENOSCOPY N/A 05/30/2022   Procedure: ESOPHAGOGASTRODUODENOSCOPY  (EGD);  Surgeon: VangLin Landsman;  [facility];  Service: Gastroenterology;  Laterality: N/A;   ESOPHAGOGASTRODUODENOSCOPY (EGD) WITH PROPOFOL N/A 03/01/2021   Procedure: ESOPHAGOGASTRODUODENOSCOPY (EGD) WITH PROPOFOL;  Surgeon: VangLin Landsman;  [facility];  Laterality: N/A;   ESOPHAGOGASTRODUODENOSCOPY (EGD) WITH PROPOFOL N/A 08/09/2021   Procedure: ESOPHAGOGASTRODUODENOSCOPY (EGD) WITH PROPOFOL;  Surgeon: WohlLucilla Lame;  [facility];  Service: Endoscopy;  Laterality: N/A;   EVALUATION UNDER ANESTHESIA WITH HEMORRHOIDECTOMY N/A 10/31/2022   Procedure: EXAM UNDER ANESTHESIA WITH SUTURE LIGATION OF TWO BLEEDING PEDICLES;  Surgeon: PiscOlean Ree;  [facility];  Service: General;  Laterality: N/A;   FLEXIBLE SIGMOIDOSCOPY N/A 10/02/2022   Procedure: FLEXIBLE SIGMOIDOSCOPY;  Surgeon: StarLadene Artist;  [facility];  Laterality: N/A;   LEFT HEART CATH AND CORONARY ANGIOGRAPHY N/A 11/04/2022   Procedure: LEFT HEART CATH AND CORONARY ANGIOGRAPHY;  Surgeon: AridWellington Hampshire;  [facility];  Service: Cardiovascular;  Laterality: N/A;   VISCERAL ANGIOGRAPHY N/A 10/24/2022   Procedure: VISCERAL ANGIOGRAPHY;  Surgeon: Dew,Algernon Huxley;  [facility];  Service: Cardiovascular;  Laterality: N/A;     Allergies  Allergies  Allergen Reactions   Penicillins Hives    Tolerated Ceftriaxone 10/2022   Other Rash    chives  History of Present Illness     52 y.o. male with a history of CAD, HTN, HL, ICM w/ improved EF, polysubstance abuse, hemorrhoidal bleeding w/ anemia, and mesenteric dzs s/p SMA stenting.  He suffered anterior STEMI and VF arrest in 2012 and underwent LAD PCI.  More recently, he was admitted in 10/2022 w/ NSTEMI and rectal bleeding/anemia.  Cath revealed patent LAD stent w/ occluded LCX and RCA w/ L  L and L  R  collaterals.  He was medically managed and required ligation of his hemorrhoids x 2 during admission.  He was diuresed and d/c'd @ 199 lbs.  He was readmitted to [facility] on 11/19/2022 due to recurrent rectal bleeding and chest/back pain (msk/pleuritic)  HsTrops nl.  UDS + for cocaine. He was conservatively managed from a cardiac standpoint, and imdur was added.  H/H stable during admission.  Since [facility] admission in 11/2022, pt says that he has cont to have some rectal bleeding (red streaks in BM) along w/ intermittent chest pain and dyspnea.  Over the past week or so, he has noted increasing DOE and lower ext swelling.  He also says that he has been having c/p that comes and goes w/ position changes and activity along w/ severe lower extremity tenderness/pain.  He presented to [facility] 2/1 and was found to be hypotensive (83/52) w/ H/H 6.3/20.0.  K 2.8.  Lipase 78, AST 99, ALT 24 and alk phos 271.  ECG w/o acute ST/T changes.  CXR w/o acute dzs, CTA GIB study w/ findings suspicious for small active rectal bleed, severe stenosis in celiac axix (prev noted), colitis vs congestive colopathy, duodenitis, and cirrhosis and borderline splenomegaly w/ portal HTN.  Pt was placed on norepi and admitted to ICU.  Transfusion ongoing.  Currently complains of diffuse body pain, including rectal pain.  Inpatient Medications     docusate sodium  100 mg Oral BID   folic acid  1 mg Oral Daily   midodrine  2.5 mg Oral TID WC   multivitamin with minerals  1 tablet Oral Daily   nicotine  14 mg Transdermal Daily   pantoprazole (PROTONIX) IV  40 mg Intravenous Q12H   polyethylene glycol  17 g Oral Daily   thiamine (VITAMIN B1) injection  100 mg Intravenous Daily   Or   thiamine  100 mg Oral Daily    Family History    Family History  Problem Relation Age of Onset   Coronary artery disease Mother        alive   Other Other        2 sisters alive and well   Alcohol abuse Father        died @ 39   Other Paternal Grandmother         PACER   He indicated that his mother is alive. He indicated that his father is deceased. He indicated that both of his sisters are alive. He indicated that his maternal grandmother is deceased. He indicated that his maternal grandfather is deceased. He indicated that his paternal grandmother is deceased. He indicated that his paternal grandfather is deceased. He indicated that the status of his other is unknown.   Social History    Social History   Socioeconomic History   Marital status: Single    Spouse name: Not on file   Number of children: Not on file   Years of education: Not on file   Highest education level: Not on file  Occupational History  Not on file  Tobacco Use   Smoking status: Every Day    Packs/day: 0.50    Years: 41.00    Total pack years: 20.50    Types: Cigarettes   Smokeless tobacco: Never   Tobacco comments:    started age 37  Vaping Use   Vaping Use: Never used  Substance and Sexual Activity   Alcohol use: Yes    Alcohol/week: 12.0 standard drinks of alcohol    Types: 12 Cans of beer per week    Comment: at least a 6 pack of beer daily and often a 12 pack   Drug use: Not Currently    Types: Cocaine, Marijuana   Sexual activity: Not on file  Other Topics Concern   Not on file  Social History Narrative   Single, lives in [place] with his mother.  He does not routinely exercise.  He sometimes works, helping to deliver appliances to homes.   Social Determinants of Health   Financial Resource Strain: Not on file  Food Insecurity: Food Insecurity Present (11/19/2022)   Hunger Vital Sign    Worried About Running Out of Food in the Last Year: Sometimes true    Ran Out of Food in the Last Year: Sometimes true  Transportation Needs: No Transportation Needs (11/19/2022)   PRAPARE - Hydrologist (Medical): No    Lack of Transportation (Non-Medical): No  Physical Activity: Not on file  Stress: Not on file  Social  Connections: Not on file  Intimate Partner Violence: Not At Risk (11/19/2022)   Humiliation, Afraid, Rape, and Kick questionnaire    Fear of Current or Ex-Partner: No    Emotionally Abused: No    Physically Abused: No    Sexually Abused: No     Review of Systems    General:  No chills, fever, night sweats or weight changes.  Cardiovascular:  +++ chest pain, +++ dyspnea on exertion, +++ edema, no orthopnea, palpitations, paroxysmal nocturnal dyspnea. Dermatological: No rash, lesions/masses Respiratory: No cough, +++ dyspnea Urologic: No hematuria, dysuria Abdominal:   No nausea, vomiting, diarrhea, +++ bright red blood per rectum, no melena, or hematemesis Neurologic:  No visual changes, wkns, changes in mental status. All other systems reviewed and are otherwise negative except as noted above.  Physical Exam    Blood pressure (!) 100/59, pulse 100, temperature 97.9 F (36.6 C), temperature source Oral, resp. rate 20, height '5\' 5"'$  (1.651 m), weight 101.7 kg, SpO2 97 %.  General: Pleasant, NAD Psych: Flat affect. Neuro: Alert and oriented X 3. Moves all extremities spontaneously. HEENT: Normal  Neck: Supple without bruits.  Obese, difficult to gauge JVP. Lungs:  Resp regular and unlabored, CTA. Heart: RRR no s3, s4, or murmurs. Abdomen: Soft, non-tender, non-distended, BS + x 4.  Extremities: No clubbing, cyanosis.  Legs very tender to light palpation.  Trace ankle edema. DP/PT2+, Radials 2+ and equal bilaterally.  Labs    Cardiac Enzymes Recent Labs  Lab 01/16/23 2347 01/17/23 0126  TROPONINIHS 431* 436*     BNP    Component Value Date/Time   BNP 1,052.4 (H) 01/16/2023 2347   Lab Results  Component Value Date   WBC 9.0 01/16/2023   HGB 6.3 (L) 01/16/2023   HCT 20.0 (L) 01/16/2023   MCV 88.5 01/16/2023   PLT 213 01/16/2023    Recent Labs  Lab 01/16/23 2347  NA 128*  K 2.8*  CL 94*  CO2 20*  BUN 20  CREATININE  1.45*  CALCIUM 7.6*  PROT 6.8  BILITOT 1.5*   ALKPHOS 271*  ALT 24  AST 99*  GLUCOSE 109*   Lab Results  Component Value Date   CHOL 120 09/30/2022   HDL 20 (L) 09/30/2022   LDLCALC 87 09/30/2022   TRIG 64 09/30/2022   Lab Results  Component Value Date   DDIMER 0.56 (H) 08/07/2021      Radiology Studies    US Venous Img Lower Bilateral (DVT)  Result Date: 01/17/2023 CLINICAL DATA:  CHF EXAM: BILATERAL LOWER EXTREMITY VENOUS DOPPLER ULTRASOUND TECHNIQUE: Gray-scale sonography with compression, as well as color and duplex ultrasound, were performed to evaluate the deep venous system(s) from the level of the common femoral vein through the popliteal and proximal calf veins. COMPARISON:  None Available. FINDINGS: VENOUS Normal compressibility of the common femoral, superficial femoral, and popliteal veins, as well as the visualized calf veins. Visualized portions of profunda femoral vein and great saphenous vein unremarkable. No filling defects to suggest DVT on grayscale or color Doppler imaging. Doppler waveforms show normal direction of venous flow, normal respiratory plasticity and response to augmentation. Subcutaneous edema in the bilateral lower legs. IMPRESSION: Negative for DVT in the lower extremities. Electronically Signed   By: Jorje Guild M.D.   On: 01/17/2023 06:56   CT ANGIO GI BLEED  Result Date: 01/17/2023 CLINICAL DATA:  Acute blood loss anemia with rectal bleeding EXAM: CTA ABDOMEN AND PELVIS WITHOUT AND WITH CONTRAST TECHNIQUE: Multidetector CT imaging of the abdomen and pelvis was performed using the standard protocol during bolus administration of intravenous contrast. Multiplanar reconstructed images and MIPs were obtained and reviewed to evaluate the vascular anatomy. RADIATION DOSE REDUCTION: This exam was performed according to the departmental dose-optimization program which includes automated exposure control, adjustment of the mA and/or kV according to patient size and/or use of iterative reconstruction  technique. CONTRAST:  144m OMNIPAQUE IOHEXOL 350 MG/ML SOLN COMPARISON:  None Available. FINDINGS: VASCULAR Aorta: Normal caliber aorta without aneurysm, dissection, vasculitis or significant stenosis. Scattered calcified atherosclerotic plaque. Celiac: Severe focal stenosis at the origin of the celiac artery similar to 09/30/2022. SMA: Interval placement of a stent at the origin of the SMA. The stent is patent. Renals: Both renal arteries are patent without evidence of aneurysm, dissection, vasculitis, fibromuscular dysplasia or significant stenosis. IMA: Patent without evidence of aneurysm, dissection, vasculitis or significant stenosis. Inflow: Patent without evidence of aneurysm, dissection, vasculitis or significant stenosis. Proximal Outflow: Bilateral common femoral and visualized portions of the superficial and profunda femoral arteries are patent without evidence of aneurysm, dissection, vasculitis or significant stenosis. Veins: Patent portal vein and IVC. Review of the MIP images confirms the above findings. NON-VASCULAR Lower chest: No acute abnormality. Hepatobiliary: Cirrhosis. Nondistended gallbladder. Gallbladder wall thickening and pericholecystic fluid favored related to cirrhosis/volume status. No biliary dilation. Pancreas: Unremarkable. Spleen: The spleen measures 14.5 cm in craniocaudal dimension. There is at the upper limits of normal in size. Adrenals/Urinary Tract: Adrenal glands are unremarkable. Kidneys are normal, without renal calculi, focal lesion, or hydronephrosis. Bladder is unremarkable. Stomach/Bowel: There is a focal hyperdensity on arterial phase images (series 11/image 1041) which diffuse is on portal venous phase and is not present on noncontrast images. This is compatible with small focus of active bleed. Hyperdense stool is present throughout the colon on noncontrast images and limits assessment for additional active bleeding. Normal caliber large and small bowel. Wall  thickening about the sigmoid colon and rectum. Additional wall thickening versus underdistention of the  ascending colon. Wall thickening and adjacent stranding and fluid about the duodenum. Lymphatic: No suspicious lymphadenopathy. Reproductive: Unremarkable. Other: Small amount of free fluid in the pelvis. Central mesenteric edema. No free intraperitoneal air. Musculoskeletal: No acute osseous abnormality. IMPRESSION: VASCULAR Findings suspicious for small active bleed in the rectum. Severe stenosis at the origin of the celiac axis is redemonstrated. Patent stent at the origin of the SMA. NON-VASCULAR Wall thickening about the sigmoid colon/rectum as well as the transverse colon favored due to colitis however congestive colopathy could appear similarly. Duodenal wall thickening suggesting duodenitis. Cirrhosis and borderline splenomegaly compatible with portal hypertension. These results were called by telephone at the time of interpretation on 01/17/2023 at 3:19 am to provider [facility] , who verbally acknowledged these results. Electronically Signed   By: Placido Sou M.D.   On: 01/17/2023 03:25   DG Chest Portable 1 View  Result Date: 01/16/2023 CLINICAL DATA:  Volume overload EXAM: PORTABLE CHEST 1 VIEW COMPARISON:  12/12/2022 FINDINGS: Cardiac shadow is enlarged but stable. No significant vascular congestion is noted. No edema is seen. The lungs are clear. No bony abnormality is noted. IMPRESSION: No active disease. Electronically Signed   By: Inez Catalina M.D.   On: 01/16/2023 23:42    ECG & Cardiac Imaging    Sinus tachycardia, 101, IVCD, lateral ST depression (new compared to 12/12/2022)- personally reviewed.  Assessment & Plan    1.  Acute blood loss anemia/Rectal bleeding:  s/p prior ligation of hemorrhoids in 10/2022. Pt notes that he's continued to have BRBPR w/ pain.  Presented to ED 2/1 due to feeling weak, dyspneic, and recent development of mild ankle edema. Found to be anemic @  6.3/20.0.  Currently receiving PRBCs.  Home dose of ASA on hold.  Per IM/surgery.  2.  CAD/Supply:Demand Ischemia:  Prior LAD stenting in the setting of STEMI/VF arrest in 2012.  Recent NSTEMI in 10/2022 w/ findings of patent LAD stent and occluded LCX and RCA w/ collaterals from the LAD filling both distal territories.  He has since been medically managed w/ imdur added in 11/2022 by our [facility] team.  Compliance w/ meds is unclear.  He reports freq chest pain, though like when he was @ [facility], it somewhat positional and pleuritic in nature.  HsTrops elevated @ 431  436.  With mild elevation and flat trend, suspect supply:demand ischemia in the setting of profound anemia.  In the setting of hypotension req norepi, home doses of ? blocker and nitrate are on hold.  With ongoing cocaine use, we should not resume ? blocker.  ASA on hold in setting of anemia.  Statin/zetia on hold in setting of elevated LFTs.  F/u CK w/ diffuse body pain/tenderness.  No plan for ischemic eval at this time.  3.  Hypotension:  In setting of #1/#2.  Norepi per CCM.  Home meds on hold.  Follow.  4.  HL:  LDL 87 last year.  Statin/zetia on hold in setting elevated LFTs.  5.  Acute HFpEF:  Nl EF by echo in November.  Pt w/ increasing dyspnea and lower ext swelling as outpt.  Compliance w/ meds unclear.  Suspect symptoms and edema at least partially explained by anemia, though BNP 1052.4.  Follow clinically.  May require IV lasix between PRBCs.  6.  PAD:  s/p prior SMA stenting.  Known celiac axis dzs.  Followed by Dr. Lucky Cowboy.  7.  Polysubstance abuse:  Tox screen again + for cocaine.  Ongoing use makes him  a poor candidate for ? blocker therapy going forward.  Needs outpt counseling.  8.  Hypokalemia:  supplementation ordered.  Risk Assessment/Risk Scores:     TIMI Risk Score for Unstable Angina or Non-ST Elevation MI:   The patient's TIMI risk score is 6, which indicates a 41% risk of all cause mortality, new or recurrent myocardial  infarction or need for urgent revascularization in the next 14 days.  New York Heart Association (NYHA) Functional Class NYHA Class III    Signed, Murray Hodgkins, NP 01/17/2023, 10:12 AM  For questions or updates, please contact   Please consult [email or web address] for contact info under Cardiology/STEMI.

## 2023-01-17 NOTE — Assessment & Plan Note (Signed)
Secondary to blood loss anemia.  Improving with IV fluids and blood transfusion

## 2023-01-17 NOTE — Assessment & Plan Note (Signed)
CIWA withdrawal protocol

## 2023-01-17 NOTE — Assessment & Plan Note (Signed)
UDS positive for cocaine

## 2023-01-17 NOTE — Progress Notes (Signed)
Asked to evaluate the patient for ICU admission by EDP. Patient chronically ill with PMHx of CAD, ICM, CHF & polysubstance abuse with chronic hemorrhoid rectal bleeding who presented with complaints of bilateral lower extremity edema over the last week or so. Lab work is significant for multiple electrolyte derangements as well as elevated troponin & BNP, lactic acidosis, Transaminitis as well as anemia. Asked to evaluate due to overall complexity and soft blood pressures with SBP in the 80's, but MAP > 65. Upon bedside assessment patient A&O x 4 on RA, VSS with most recent BP 104/64, patient denies chest pain.  He reports multiple symptoms but states these have all been ongoing since November. Unclear how compliant patient has been with his outpatient medications. He reports taking his medication but then says he "hates pills". Hgb acutely 6.3 with patient endorsing continued rectal bleeding chronically outpatient.  Suspect soft hemodynamics are due to acute anemia, EDP has ordered blood for transfusion which should begin shortly.   Patient does not meet ICU admission criteria at this time, please re-consult if needed.    Ethan Wells, AGACNP-BC Acute Care Nurse Practitioner [facility]   [phone] / [phone] Please see Amion for pager details.

## 2023-01-17 NOTE — Progress Notes (Signed)
PHARMACY CONSULT NOTE - FOLLOW UP  Pharmacy Consult for Electrolyte Monitoring and Replacement   Recent Labs: Potassium (mmol/L)  Date Value  01/17/2023 4.2  09/06/2013 3.5   Magnesium (mg/dL)  Date Value  01/17/2023 1.7  03/09/2013 2.2   Calcium (mg/dL)  Date Value  01/17/2023 7.2 (L)   Calcium, Total (mg/dL)  Date Value  09/06/2013 8.9   Albumin (g/dL)  Date Value  01/16/2023 2.6 (L)  09/06/2013 4.0   Phosphorus (mg/dL)  Date Value  01/17/2023 2.5   Sodium (mmol/L)  Date Value  01/17/2023 128 (L)  09/06/2013 134 (L)    Assessment: 52 year old male admitted with hemorrhagic shock and acute on chronic heart failure (HFpEF). PMH includes polysubstance abuse, CAD, anemia due to hemorrhoids, PAD, chronic pain syndrome, mesenteric ischemia s/p stent, history of hemorrhoidal bleeding in 10/2022, and portal hypertension.  Pertinent medications: ceftriaxone 2 grams for SBP prophylaxis, octreotide infusion '@50'$  mcg/hr, vitamin K x 1 dose, norepinephrine '@6'$  mcg/min, midodrine, pantoprazole IV 40 mg Q12Hr, PRBC 2 units on 2/2, folic acid and thiamine  Goal of Therapy:  K > 4 Mag > 2 All other electrolytes within normal limits  Plan:  K 4.2 after replacement with Kcl po 80 meq. No additional replacement needed tonight Follow up electrolytes tomorrow (2/3) AM  Nyella Eckels Rodriguez-Guzman PharmD, BCPS 01/17/2023 9:08 PM

## 2023-01-17 NOTE — Progress Notes (Signed)
An USGPIV (ultrasound guided PIV) has been placed for short-term vasopressor infusion. A correctly placed ivWatch must be used when administering Vasopressors. Should this treatment be needed beyond 72 hours, central line access should be obtained.  It will be the responsibility of the bedside nurse to follow best practice to prevent extravasations.   

## 2023-01-17 NOTE — Hospital Course (Addendum)
431  436 Lactic acid 5.1  3.2 K 2.8, Creat 1.45 H/H 6.3/20.0 BNP 1052.4

## 2023-01-17 NOTE — Consult Note (Signed)
   Heart Failure Nurse Navigator Note  HFimpEF 55 to 60%.  Normal right ventricular systolic function this was performed in November 2023.  Echocardiogram performed on this admission alts are pending.  He presented to the emergency room because of lower extremity swelling, fatigue and dyspnea on exertion.  Hemoglobin was noted to be 6.3.  BNP 1052.  X-ray revealed no active disease.  Comorbidities:  Coronary artery disease Hypertension Hyperlipidemia Polysubstance abuse Mesenteric stenting  He has a history of STEMI and V-fib arrest, with LAD stenting in 2012.  Medications:  GDMT is currently on hold due to hypotension.  Labs:  Sodium 128, potassium 2.9, chloride 96, CO2 23, BUN 18, creatinine 1.3, estimated GFR greater than 60, lactic acid 1.6.  UDS was positive for cocaine and benzo.  Initial meeting with patient in the ICU.  He is currently receiving a unit of blood and is on Levophed infusion.  He states that he has heard the term heart failure before and explained it is his heart not working as it should.  He currently lives in a boardinghouse as he states he lost his home in 2020.  Discussed his diet and the types of foods that he is eating.  He states that he does not have the money to buy foods that he should he been eating, he has been getting   SpaghettiOs from a can, bologna sandwiches, convenience foods  etc.does not eat at restaurants due to the cost.  Also discussed his fluid intake.  He admitted to 12 bottles of beer daily addition to the water that he drinks.  Needed to be redirected several times throughout the interview as he would get off on tangents talking about his sister that died approximately a year ago and other problems in his life.  Was given the living with heart failure teaching booklet, zone magnet, info on low-sodium and heart failure along with weight chart.  He has follow-up in the outpatient heart failure clinic on February 8 at 11 AM.  He has a  7% no-show which is 5 out of 68 appointments.  Does not have a scale to weigh himself.  Pricilla Riffle RN CHFN

## 2023-01-17 NOTE — ED Notes (Signed)
ED Provider at bedside. 

## 2023-01-17 NOTE — Assessment & Plan Note (Signed)
S/p stent 10/2022

## 2023-01-17 NOTE — ED Notes (Signed)
Report to [employer], Therapist, sports

## 2023-01-17 NOTE — Progress Notes (Signed)
This encounter was created in error - please disregard.

## 2023-01-17 NOTE — Progress Notes (Signed)
[facility] Progress Note  [name]    TDD:[id number]  DOA: 01/16/2023    Date of Service: the patient was seen and examined on 01/17/2023  Brief hospital course: 52 year old male with past medical history of morbid obesity, CAD, polysubstance abuse, mesenteric ischemia status post stent and history of recent hemorrhoidal bleeding status post hemorrhoidectomy which was complicated by recurrent bleeding requiring repeat visit to the OR and non-STEMI.  At that time, patient was transfused 1 unit packed red blood cell transfusion.  Patient presented back to the emergency room on the night of 2/1 with complaints of lower extremity swelling and dyspnea on exertion.  Patient found to have acute kidney injury, hypotension with systolic blood pressure in the 80s tachypnea and hemoglobin of 6.3.  Vascular imaging noted active rectal bleeding and patient admitted for hemorrhagic shock with secondary blood loss anemia.  Also felt to have high-output heart failure.  Transfused 2 units packed red blood cells plus blood on IV fluids.  Patient also had elevated troponins which peaked as high as the 400s.  General surgery consulted.   Assessment and Plan: * Hemorrhagic shock ([place]) Secondary to acute on chronic blood loss anemia/symptomatic anemia Hematochezia, suspect secondary to hemorrhoidal bleeding History of hemorrhoidectomy November 2023 Patient has a history of bleeding hemorrhoids requiring hemorrhoidectomy in November 6237 complicated by rebleed and now reports bleeding with each BM CTA showing "Findings suspicious for small active bleed in the rectum".  Treat with IV fluids and blood transfusions.  General surgery and GI following  Acute blood loss anemia Secondary to hemorrhoidal bleeding.  An ongoing problem.  General surgery recommending no surgical intervention at this time.  Gastroenterology consulted recommending blood transfusion keeping hemoglobin above 7-8.  Avoid  NSAIDs.  Scopes after more stable  Acute on chronic heart failure with preserved ejection fraction (HFpEF) ([place]) Suspect high-output heart failure from ongoing blood loss.,  Also with drug use.  Echocardiogram results pending.  Diuresing as blood pressure allows.  Demand ischemia Appreciate cardiology help.  Elevated troponins felt to be less likely due to acute non-STEMI more likely due to demand mismatch from blood loss anemia.  No beta-blocker for cocaine use.  Aspirin on hold due to anemia.  Continue to monitor.  Acute kidney injury ([place]) Secondary to blood loss anemia.  Improving with IV fluids and blood transfusion  Essential hypertension Blood pressure medications on hold due to hypotension from blood loss  Cocaine dependence with cocaine-induced disorder ([place]) Will counsel.  Unable to do procedures until cocaine has cleared his system.  Alcohol use disorder CIWA withdrawal protocol  Tobacco abuse Will counsel to quit  Morbid obesity ([place]) Meets criteria with BMI greater than 35 and comorbidities of CAD, CHF  Mesenteric artery stenosis ([facility]) S/p stent 10/2022  Cocaine abuse ([place]) UDS positive for cocaine       Body mass index is 37.31 kg/m.        Consultants: General surgery Gastroenterology Critical care Cardiology  Procedures: Echocardiogram noting ejection fraction on low end of normal at 50 to 55%, indeterminate diastolic function Status post multiple units packed red blood cells  Antimicrobials: None  Code Status: Full code   Subjective: Complains of headache  Objective: Blood pressure is improving Vitals:   01/17/23 1627 01/17/23 1645  BP: 111/61 103/70  Pulse: 95 96  Resp: 17 (!) 26  Temp: 97.8 F (36.6 C)   SpO2: 98% 97%    Intake/Output Summary (Last 24 hours) at 01/17/2023 1710 Last data  filed at 01/17/2023 1530 Gross per 24 hour  Intake 1589.3 ml  Output 650 ml  Net 939.3 ml   Filed Weights   01/16/23 2305 01/17/23 0430   Weight: 91.2 kg 101.7 kg   Body mass index is 37.31 kg/m.  Exam:  General: Somnolent HEENT: Normocephalic, atraumatic, mucous membranes slightly dry Cardiovascular: Irregular rhythm, rate controlled Respiratory: Decreased breath sounds throughout secondary to body habitus Abdomen: Soft, obese, nontender, positive bowel sounds Musculoskeletal: No clubbing or cyanosis, trace pitting edema Skin: No skin breaks, tears or lesions Psychiatry: Appropriate, but groggy Neurology: No focal deficits  Data Reviewed: Sodium of 128, potassium 2.9, creatinine 1.3  Disposition:  Status is: Inpatient Remains inpatient appropriate because:  -Stabilization of blood pressure -Stabilization of hemoglobin    Anticipated discharge date: 2/6  Family Communication: Will call family DVT Prophylaxis: SCDs Start: 01/17/23 0414    Author: Annita Brod ,MD 01/17/2023 5:10 PM  To reach On-call, see care teams to locate the attending and reach out via [email or web address]. Between 7PM-7AM, please contact night-coverage If you still have difficulty reaching the attending provider, please page the [facility] (Director on Call) for [facility] on amion for assistance.

## 2023-01-17 NOTE — Assessment & Plan Note (Signed)
Will counsel.  Unable to do procedures until cocaine has cleared his system.

## 2023-01-17 NOTE — Assessment & Plan Note (Addendum)
Secondary to hemorrhoidal bleeding.  An ongoing problem.  General surgery recommending no surgical intervention at this time.  Gastroenterology consulted recommending blood transfusion keeping hemoglobin above 7-8.  Avoid NSAIDs.  Last hemoglobin 10.7.  Received 5 units of blood 2 units of FFP and 1 unit of platelets.

## 2023-01-17 NOTE — Progress Notes (Signed)
PHARMACY CONSULT NOTE - FOLLOW UP  Pharmacy Consult for Electrolyte Monitoring and Replacement   Recent Labs: Potassium (mmol/L)  Date Value  01/17/2023 2.9 (L)  09/06/2013 3.5   Magnesium (mg/dL)  Date Value  01/17/2023 1.7  03/09/2013 2.2   Calcium (mg/dL)  Date Value  01/17/2023 7.2 (L)   Calcium, Total (mg/dL)  Date Value  09/06/2013 8.9   Albumin (g/dL)  Date Value  01/16/2023 2.6 (L)  09/06/2013 4.0   Phosphorus (mg/dL)  Date Value  01/17/2023 2.5   Sodium (mmol/L)  Date Value  01/17/2023 128 (L)  09/06/2013 134 (L)    Assessment: 52 year old male admitted with hemorrhagic shock and acute on chronic heart failure (HFpEF). PMH includes polysubstance abuse, CAD, anemia due to hemorrhoids, PAD, chronic pain syndrome, mesenteric ischemia s/p stent, history of hemorrhoidal bleeding in 10/2022, and portal hypertension.  Pertinent medications: ceftriaxone 2 grams for SBP prophylaxis, octreotide infusion '@50'$  mcg/hr, vitamin K x 1 dose, norepinephrine '@6'$  mcg/min, midodrine, pantoprazole IV 40 mg Q12Hr, PRBC 2 units on 2/2, folic acid and thiamine  Goal of Therapy:  K > 4 Mag > 2 All other electrolytes within normal limits  Plan:  K 2.9. Kcl IV 40 mEq x 4 runs and Kcl PO x 1 Magnesium sulfate IV 2 grams x 1 replaced  Follow up K+ on 2/2 '@2000'$  Follow up all other electrolytes tomorrow (2/3) AM   Glean Salvo, PharmD, BCPS Clinical Pharmacist  01/17/2023 12:43 PM

## 2023-01-17 NOTE — Consult Note (Addendum)
Gastroenterology Consultation  Referring Provider:    Donell Beers, NP Admit date 01/17/23 Consult date    01/17/23     Reason for Consultation:     rectal bleeding         HPI:   Ethan Wells is a 52 y.o. male with medical history significant for polysubstance abuse,, cardiac arrest, V-fib 2012.  coronary artery disease, anemia due to hemorrhoids, chronic pain syndrome  mesenteric ischemia s/p stent,  hemorrhoidal bleeding s/p hemorrhoidectomy 56/43/3295 complicated by takeback to the OR,  NSTEMI for which he could not complete heparin infusion because of rectal bleeding. He was admitted for rectal bleeding today- also noted to have anasarca, cirrhosis with signs of portal hypertension, elevated troponins/possible +NSTEMI and + cocaine/bzd levels on drug screen. He has admitted continuing to drink etoh and is on CIWA.  Surgeon was consulted for CTA findings suspicious for a small bleed in the rectum - did not severe stenosis in the celiac origin again and a patent stent in the SMA- however patient has declined surgical intervention at this time.  He has been started on rocephin, octreotide, levophed, midodrine, miralax, and pantoprazole. Was given some vitamin K.  Note he was also seen by Dr Fuller Plan at [facility] for rectal bleeding 10/23 thought to be hemorrhoidal v nonspecific colitis (? Ischemic) v portal colopathy. Noted he was negative for viral hepatitis   HGB 6.3 this admission. Pt 16.3, INR 1.1. ast 99, alt 24, alp 271, t bili 1.5, albumin 2.6. he was hyponatremic and hypokalemic. Troponins in the 400s. Bc's pending  Patient reports he has had on an off rectal bleeding for about 20y- had a very physical job at the time. States his problems with rectal bleeding have increased over the last months and sees red materiral in mild to moderate amounts  daily with each bm. States he has some mild constipation but does not have to strain a lot. States he currently has some on and off rlq pain and  today has had some ruq pain. Feels swollen. States his reflux is well controlled by his daily pantoprazole and denies any dysphagia, nausea. Does report having some black stools and dark vomit with coughing up some bloody colored material about a month ago. Does endorse taking 800-'1600mg'$  ibuprofen every day for various aches and pains.  Endorses 12-44oz alcohol daily. States he last used cocaine 5d ago (Saturday). He feels like his hemorrhoid surgery has contributed to his current bleeding issues. We talked about other reasons that could also be conrtibuting- ie chronic liver disease/vascular disease. NSAIDs, alcohol & suibstances causing ischemia, etc. He says he did not know he had cirrhosis so we discussed this and its most likely etiologies. He says he has had treatment for substance abuse in past but has had a hard time accessing help for this lately. He is not going to AA or NA. Denies any history of jaundice/ascites. Father and sister had cirrhosis.  PREVIOUS ENDOSCOPIES:            EGD x 4 between 02/2021 and 05/2022.   Studies performed for complaint of epigastric pain and persistent reflux despite therapies.  Findings have included  12/8839 EGD: alcoholic gastritis, medium sized hiatal hernia.  Salmon-colored mucosa suspicious for long segment Barrett's.  Path: Reactive gastropathy, no acute inflammation, no H. pylori.  No intestinal metaplasia.  Esophageal biopsies confirmed nonbleeding dysplastic Barrett's. 6/60/6301 EGD: Alcoholic duodenitis.  Portal hypertensive gastropathy.  Esophageal mucosal changes suspicious for long segment Barrett's.  Grade D acute and erosive esophagitis without bleeding.  Path: Stratified squamous epithelium without inflammation or reactive changes.  No dysplasia, no malignancy 07/2021 Colonoscopy.  For abdominal pain.  2, 5 to 7 mm sized, polyps in ascending colon resected.  Nonbleeding, grade 2 internal hemorrhoids.  Path: TAs w no HGD.  Barrett's esophagus.   05/28/2022  abdominal ultrasound showed marked hepatic steatosis progressive from prior studies.  CBD 4 mm.  PV Dopplers normal.  GB normal   Past Medical History:  Diagnosis Date   CAD (coronary artery disease)    a. 01/2011 Anterior STEMI/Cath/PCI: LM nl, LAD 100d (3.5x57m Vision BMS placed), LCX 232mRI 50, RCA min irregs, EF 40% w/ apical, inferoapical HK; b. 10/2022 NSTSEMI/Cath: LM nl, LAD 5p/m, RI 85, LCX 10079mM1 mild dzs, OM3 100 fills via L->L collats from dLAD, RCA 80p, 100m75mDA fills via collats from LAD. EF 45-50%-->Med Rx.   Cardiac arrest - ventricular fibrillation    a. In setting of STEMI 01/2011   Chronic pain    Cocaine abuse (HCC)Rossie Depression with anxiety    ETOH abuse    a. 6-12 beers/day   GERD (gastroesophageal reflux disease)    GI bleed    Hemorrhoids    Hyperlipidemia    Hypertension    Ischemic cardiomyopathy    a. 06/2011 Echo: EF 45-50%, No rwma; b. 10/2022 Echo: EF 55-60%, no rwma, nl RV fxn.   Marijuana abuse    Migraines    Tobacco abuse    a. 1/2 ppd x 26 yrs    Past Surgical History:  Procedure Laterality Date   COLONOSCOPY  08/09/2021   Procedure: COLONOSCOPY;  Surgeon: WohlLucilla Lame;  [facility];  Service: Endoscopy;;   CORONARY ANGIOPLASTY WITH STENT PLACEMENT     ESOPHAGOGASTRODUODENOSCOPY N/A 05/30/2022   Procedure: ESOPHAGOGASTRODUODENOSCOPY (EGD);  Surgeon: VangLin Landsman;  [facility];  Service: Gastroenterology;  Laterality: N/A;   ESOPHAGOGASTRODUODENOSCOPY (EGD) WITH PROPOFOL N/A 03/01/2021   Procedure: ESOPHAGOGASTRODUODENOSCOPY (EGD) WITH PROPOFOL;  Surgeon: VangLin Landsman;  [place];  Laterality: N/A;   ESOPHAGOGASTRODUODENOSCOPY (EGD) WITH PROPOFOL N/A 08/09/2021   Procedure: ESOPHAGOGASTRODUODENOSCOPY (EGD) WITH PROPOFOL;  Surgeon: WohlLucilla Lame;  [facility];  Service: Endoscopy;  Laterality: N/A;   EVALUATION UNDER ANESTHESIA WITH  HEMORRHOIDECTOMY N/A 10/31/2022   Procedure: EXAM UNDER ANESTHESIA WITH SUTURE LIGATION OF TWO BLEEDING PEDICLES;  Surgeon: PiscOlean Ree;  [facility];  Service: General;  Laterality: N/A;   FLEXIBLE SIGMOIDOSCOPY N/A 10/02/2022   Procedure: FLEXIBLE SIGMOIDOSCOPY;  Surgeon: StarLadene Artist;  [place];  Laterality: N/A;   LEFT HEART CATH AND CORONARY ANGIOGRAPHY N/A 11/04/2022   Procedure: LEFT HEART CATH AND CORONARY ANGIOGRAPHY;  Surgeon: AridWellington Hampshire;  [place] [facility] [place];  Service: Cardiovascular;  Laterality: N/A;   VISCERAL ANGIOGRAPHY N/A 10/24/2022   Procedure: VISCERAL ANGIOGRAPHY;  Surgeon: Dew,Algernon Huxley;  [facility];  Service: Cardiovascular;  Laterality: N/A;    Family History  Problem Relation Age of Onset   Coronary artery disease Mother        alive   Other Other        2 sisters alive and well   Alcohol abuse Father        died @ 59  16ther Paternal Grandmother        PACER  Social History   Tobacco Use   Smoking status: Every Day    Packs/day: 0.50    Years: 41.00    Total pack years: 20.50    Types: Cigarettes   Smokeless tobacco: Never   Tobacco comments:    started age 87  Vaping Use   Vaping Use: Never used  Substance Use Topics   Alcohol use: Yes    Alcohol/week: 12.0 standard drinks of alcohol    Types: 12 Cans of beer per week    Comment: at least a 6 pack of beer daily and often a 12 pack   Drug use: Not Currently    Types: Cocaine, Marijuana    Prior to Admission medications   Medication Sig Start Date End Date Taking? Authorizing Provider  acetaminophen (TYLENOL) 500 MG tablet Take 2 tablets (1,000 mg total) by mouth every 6 (six) hours as needed for mild pain. 10/28/22   Olean Ree, MD  aspirin 81 MG chewable tablet Chew 1 tablet (81 mg total) by mouth daily. 11/06/22 02/04/23  Enzo Bi, MD  calcium-vitamin D (OSCAL WITH D) 500-5 MG-MCG  tablet Take 1 tablet by mouth 2 (two) times daily. 10/17/22   Medina-Vargas, Monina C, NP  chlordiazePOXIDE (LIBRIUM) 5 MG capsule Take 3 tablets by mouth twice a day x 2 days; then 2 tablets by mouth twice a day x 2 days; then 1 tablet by mouth 3 times a day x 2 days; then 1 tablet by mouth twice a day x 2 days; then 1 tablet by mouth daily x 3 days and stop Librium. Patient not taking: Reported on 01/17/2023 11/21/22   Barton Dubois, MD  ezetimibe (ZETIA) 10 MG tablet Take 10 mg by mouth daily.    [provider]  ferrous sulfate 325 (65 FE) MG tablet Take 1 tablet (325 mg total) by mouth daily with breakfast. 11/06/22 02/04/23  Enzo Bi, MD  folic acid (FOLVITE) 1 MG tablet Take 1 tablet (1 mg total) by mouth daily. 11/06/22   Enzo Bi, MD  furosemide (LASIX) 40 MG tablet Take 1 tablet (40 mg total) by mouth daily. Patient taking differently: Take 40 mg by mouth 2 (two) times daily. 11/21/22 02/19/23  Barton Dubois, MD  gabapentin (NEURONTIN) 300 MG capsule Take 1 capsule (300 mg total) by mouth 3 (three) times daily. 10/17/22   Medina-Vargas, Monina C, NP  isosorbide mononitrate (IMDUR) 30 MG 24 hr tablet Take 0.5 tablets (15 mg total) by mouth daily. 11/22/22   Barton Dubois, MD  lidocaine (XYLOCAINE) 5 % ointment Apply 1 Application topically 4 (four) times daily as needed. Apply to the perianal area Patient not taking: Reported on 01/17/2023 10/28/22   Olean Ree, MD  magnesium oxide (MAG-OX) 400 (240 Mg) MG tablet Take 1 tablet (400 mg total) by mouth 2 (two) times daily. 11/06/22   Enzo Bi, MD  methocarbamol (ROBAXIN) 750 MG tablet TAKE 1 TABLET (750 MG TOTAL) BY MOUTH DAILY AS NEEDED FOR MUSCLE SPASMS 12/23/22   Medina-Vargas, Monina C, NP  metoprolol tartrate (LOPRESSOR) 25 MG tablet Take 12.5 mg by mouth 2 (two) times daily.    Enzo Bi, MD  midodrine (PROAMATINE) 2.5 MG tablet Take 1 tablet (2.5 mg total) by mouth 3 (three) times daily with meals. Patient not taking: Reported on  01/17/2023 11/24/22   Barton Dubois, MD  Multiple Vitamins-Minerals (MULTIVITAMIN) tablet Take 1 tablet by mouth daily. 11/06/22 02/04/23  Enzo Bi, MD  nicotine (NICODERM CQ - DOSED IN MG/24 HOURS) 14  mg/24hr patch Place 1 patch (14 mg total) onto the skin daily. 11/21/22   Barton Dubois, MD  nitroGLYCERIN (NITROSTAT) 0.4 MG SL tablet Place 1 tablet (0.4 mg total) under the tongue every 5 (five) minutes as needed for chest pain. 11/06/22   Enzo Bi, MD  pantoprazole (PROTONIX) 40 MG tablet TAKE 1 TABLET BY MOUTH EVERY DAY Patient taking differently: Take 40 mg by mouth 2 (two) times daily. 12/23/22   Medina-Vargas, Monina C, NP  polyethylene glycol (MIRALAX / GLYCOLAX) 17 g packet Take 17 g by mouth daily. 11/06/22 02/04/23  Enzo Bi, MD  psyllium (HYDROCIL/METAMUCIL) 95 % PACK Take 1 packet by mouth daily. 11/06/22 02/04/23  Enzo Bi, MD  rosuvastatin (CRESTOR) 40 MG tablet TAKE 1 TABLET BY MOUTH EVERY DAY 12/19/22   Medina-Vargas, Monina C, NP  thiamine (VITAMIN B-1) 100 MG tablet Take 1 tablet (100 mg total) by mouth daily. 11/06/22   Enzo Bi, MD    Current Facility-Administered Medications  Medication Dose Route Frequency Provider Last Rate Last Admin   0.9 %  sodium chloride infusion (Manually program via Guardrails IV Fluids)   Intravenous Once Teressa Lower, NP   Held at 01/17/23 1152   0.9 %  sodium chloride infusion  250 mL Intravenous Continuous Rust-Chester, Huel Cote, NP 10 mL/hr at 01/17/23 1111 Infusion Verify at 01/17/23 1111   acetaminophen (TYLENOL) tablet 650 mg  650 mg Oral Q4H PRN Athena Masse, MD   650 mg at 01/17/23 0946   cefTRIAXone (ROCEPHIN) 2 g in sodium chloride 0.9 % 100 mL IVPB  2 g Intravenous Q24H Armando Reichert, MD 200 mL/hr at 01/17/23 1142 2 g at 01/17/23 1142   docusate sodium (COLACE) capsule 100 mg  100 mg Oral BID Teressa Lower, NP   100 mg at 76/73/41 9379   folic acid 1 mg in sodium chloride 0.9 % 50 mL IVPB  1 mg Intravenous Daily Rust-Chester, Huel Cote, NP       Or   folic acid (FOLVITE) tablet 1 mg  1 mg Oral Daily Rust-Chester, Huel Cote, NP   1 mg at 01/17/23 0946   midodrine (PROAMATINE) tablet 2.5 mg  2.5 mg Oral TID WC Teressa Lower, NP       multivitamin with minerals tablet 1 tablet  1 tablet Oral Daily Teressa Lower, NP   1 tablet at 01/17/23 0949   nicotine (NICODERM CQ - dosed in mg/24 hours) patch 14 mg  14 mg Transdermal Daily Athena Masse, MD   14 mg at 01/17/23 0947   norepinephrine (LEVOPHED) '4mg'$  in 259m (0.016 mg/mL) premix infusion  2-10 mcg/min Intravenous Titrated Rust-Chester, Britton L, NP 22.5 mL/hr at 01/17/23 1111 6 mcg/min at 01/17/23 1111   octreotide (SANDOSTATIN) 2 mcg/mL load via infusion 50 mcg  50 mcg Intravenous Once DArmando Reichert MD       octreotide (SANDOSTATIN) 500 mcg in sodium chloride 0.9 % 250 mL (2 mcg/mL) infusion  50 mcg/hr Intravenous Continuous Dgayli, Khabib, MD       pantoprazole (PROTONIX) injection 40 mg  40 mg Intravenous Q12H Rust-Chester, Britton L, NP   40 mg at 01/17/23 0951   phytonadione (VITAMIN K) 10 mg in dextrose 5 % 50 mL IVPB  10 mg Intravenous Once DArmando Reichert MD       polyethylene glycol (MIRALAX / GLYCOLAX) packet 17 g  17 g Oral Daily NTeressa Lower NP   17 g at 01/17/23 [id number]  thiamine (VITAMIN B1) injection 100 mg  100 mg Intravenous Daily Rust-Chester, Britton L, NP       Or   thiamine (VITAMIN B1) tablet 100 mg  100 mg Oral Daily Rust-Chester, Britton L, NP   100 mg at 01/17/23 0949    Allergies as of 01/16/2023 - Review Complete 01/16/2023  Allergen Reaction Noted   Penicillins Hives 02/26/2011   Other Rash 02/22/2021     Review of Systems:    All systems reviewed and negative except where noted in HPI.     Physical Exam:  Vital signs in last 24 hours: Temp:  [97.4 F (36.3 C)-98.5 F (36.9 C)] 97.6 F (36.4 C) (02/02 1028) Pulse Rate:  [91-100] 94 (02/02 1140) Resp:  [12-40] 16 (02/02 1140) BP: (77-148)/(36-99) 77/47 (02/02 1140) SpO2:  [95  %-100 %] 96 % (02/02 1140) Weight:  [91.2 kg-101.7 kg] 101.7 kg (02/02 0430) Last BM Date : 01/17/23 General:   Pleasant man in NAD Head:  Normocephalic and atraumatic. Eyes:   No icterus.   Conjunctiva pale pink. Ears:  Normal auditory acuity. Mouth: Mucosa pale pink moist, no lesions. Neck:  Supple; no masses felt Lungs:  Respirations even and unlabored. Lungs clear to auscultation bilaterally.   No wheezes, crackles, or rhonchi.  Heart:  S1S2, RRR, no MRG. No edema. Abdomen:   Protuberant soft, nondistended, nontender. Normal bowel sounds. No appreciable masses or hepatomegaly. No rebound signs or other peritoneal signs. Rectal:  Not performed.  Msk:  MAEW x4, No clubbing or cyanosis. Strength 4/5. Symmetrical without gross deformities. Neurologic:  Alert and  oriented x4;  Cranial nerves II-XII intact.  Skin:  Warm, dry, sallow  without significant lesions or rashes. Psych:  Alert and cooperative. Normal affect. Limited insight and judgement  LAB RESULTS: Recent Labs    01/16/23 2347 01/17/23 1127  WBC 9.0  --   HGB 6.3* 6.0*  HCT 20.0* 18.3*  PLT 213  --    BMET Recent Labs    01/16/23 2347  NA 128*  K 2.8*  CL 94*  CO2 20*  GLUCOSE 109*  BUN 20  CREATININE 1.45*  CALCIUM 7.6*   LFT Recent Labs    01/16/23 2347  PROT 6.8  ALBUMIN 2.6*  AST 99*  ALT 24  ALKPHOS 271*  BILITOT 1.5*   PT/INR Recent Labs    01/17/23 0043  LABPROT 16.0*  INR 1.3*    STUDIES: US Venous Img Lower Bilateral (DVT)  Result Date: 01/17/2023 CLINICAL DATA:  CHF EXAM: BILATERAL LOWER EXTREMITY VENOUS DOPPLER ULTRASOUND TECHNIQUE: Gray-scale sonography with compression, as well as color and duplex ultrasound, were performed to evaluate the deep venous system(s) from the level of the common femoral vein through the popliteal and proximal calf veins. COMPARISON:  None Available. FINDINGS: VENOUS Normal compressibility of the common femoral, superficial femoral, and popliteal veins,  as well as the visualized calf veins. Visualized portions of profunda femoral vein and great saphenous vein unremarkable. No filling defects to suggest DVT on grayscale or color Doppler imaging. Doppler waveforms show normal direction of venous flow, normal respiratory plasticity and response to augmentation. Subcutaneous edema in the bilateral lower legs. IMPRESSION: Negative for DVT in the lower extremities. Electronically Signed   By: Jorje Guild M.D.   On: 01/17/2023 06:56   CT ANGIO GI BLEED  Result Date: 01/17/2023 CLINICAL DATA:  Acute blood loss anemia with rectal bleeding EXAM: CTA ABDOMEN AND PELVIS WITHOUT AND WITH CONTRAST TECHNIQUE: Multidetector CT imaging of  the abdomen and pelvis was performed using the standard protocol during bolus administration of intravenous contrast. Multiplanar reconstructed images and MIPs were obtained and reviewed to evaluate the vascular anatomy. RADIATION DOSE REDUCTION: This exam was performed according to the departmental dose-optimization program which includes automated exposure control, adjustment of the mA and/or kV according to patient size and/or use of iterative reconstruction technique. CONTRAST:  138m OMNIPAQUE IOHEXOL 350 MG/ML SOLN COMPARISON:  None Available. FINDINGS: VASCULAR Aorta: Normal caliber aorta without aneurysm, dissection, vasculitis or significant stenosis. Scattered calcified atherosclerotic plaque. Celiac: Severe focal stenosis at the origin of the celiac artery similar to 09/30/2022. SMA: Interval placement of a stent at the origin of the SMA. The stent is patent. Renals: Both renal arteries are patent without evidence of aneurysm, dissection, vasculitis, fibromuscular dysplasia or significant stenosis. IMA: Patent without evidence of aneurysm, dissection, vasculitis or significant stenosis. Inflow: Patent without evidence of aneurysm, dissection, vasculitis or significant stenosis. Proximal Outflow: Bilateral common femoral and  visualized portions of the superficial and profunda femoral arteries are patent without evidence of aneurysm, dissection, vasculitis or significant stenosis. Veins: Patent portal vein and IVC. Review of the MIP images confirms the above findings. NON-VASCULAR Lower chest: No acute abnormality. Hepatobiliary: Cirrhosis. Nondistended gallbladder. Gallbladder wall thickening and pericholecystic fluid favored related to cirrhosis/volume status. No biliary dilation. Pancreas: Unremarkable. Spleen: The spleen measures 14.5 cm in craniocaudal dimension. There is at the upper limits of normal in size. Adrenals/Urinary Tract: Adrenal glands are unremarkable. Kidneys are normal, without renal calculi, focal lesion, or hydronephrosis. Bladder is unremarkable. Stomach/Bowel: There is a focal hyperdensity on arterial phase images (series 11/image 1041) which diffuse is on portal venous phase and is not present on noncontrast images. This is compatible with small focus of active bleed. Hyperdense stool is present throughout the colon on noncontrast images and limits assessment for additional active bleeding. Normal caliber large and small bowel. Wall thickening about the sigmoid colon and rectum. Additional wall thickening versus underdistention of the ascending colon. Wall thickening and adjacent stranding and fluid about the duodenum. Lymphatic: No suspicious lymphadenopathy. Reproductive: Unremarkable. Other: Small amount of free fluid in the pelvis. Central mesenteric edema. No free intraperitoneal air. Musculoskeletal: No acute osseous abnormality. IMPRESSION: VASCULAR Findings suspicious for small active bleed in the rectum. Severe stenosis at the origin of the celiac axis is redemonstrated. Patent stent at the origin of the SMA. NON-VASCULAR Wall thickening about the sigmoid colon/rectum as well as the transverse colon favored due to colitis however congestive colopathy could appear similarly. Duodenal wall thickening  suggesting duodenitis. Cirrhosis and borderline splenomegaly compatible with portal hypertension. These results were called by telephone at the time of interpretation on 01/17/2023 at 3:19 am [facility] who verbally acknowledged these results. Electronically Signed   By: TPlacido SouM.D.   On: 01/17/2023 03:25   DG Chest Portable 1 View  Result Date: 01/16/2023 CLINICAL DATA:  Volume overload EXAM: PORTABLE CHEST 1 VIEW COMPARISON:  12/12/2022 FINDINGS: Cardiac shadow is enlarged but stable. No significant vascular congestion is noted. No edema is seen. The lungs are clear. No bony abnormality is noted. IMPRESSION: No active disease. Electronically Signed   By: MInez CatalinaM.D.   On: 01/16/2023 23:42       Impression / Plan:   Rectal bleeding/anema/melena/hematemesis- broad ddx- recommending egd and flex sig  v colonoscopy however needs to be done when clinically feasible-  would like hgb >7 and metabolic derangements corrected prior to consideration of  sedated procedures- agree with planned prbc transfusions and current pantoprazole/octreotide/roceohin/pressors. Did advise to avoid nsaids . Last bm yesterday Hemorrhoids- have been source of bleeding in the past; s/p hemorrhoidectomy 10/28/22 Mesenteric ischemia hx Cirrhosis- likely alcoholic/substances- recommend aa/na/treatment/counselling and avoiding etoh/illicits- recommend following up as o/p MELD 3.0 (22) Polysubstance abuse; cocaine positive; says last used 01/11/23; using etoh and tobacco Chronic pain CAD  Thank you very much for this consult. These services were provided by Stephens November, NP-C, in collaboration with Ronne Binning, DO, with whom I have discussed this patient in full.   Stephens November, NP-C

## 2023-01-17 NOTE — TOC Initial Note (Signed)
Transition of Care [facility]) - Initial/Assessment Note    Patient Details  Name: Ethan Wells MRN: [id number] Date of Birth: 19-Apr-1971  Transition of Care [facility]) CM/SW Contact:    Shelbie Hutching, RN Phone Number: 01/17/2023, 3:37 PM  Clinical Narrative:                 RNCM attempted to meet with patient at the bedside this morning.  Patient is able to open eyes and answers a couple of questions before falling asleep and snoring. He lives in a boarding house by himself.  He does have a car and drives.   TOC will cont to follow.   Expected Discharge Plan:  (tbd) Barriers to Discharge: Continued Medical Work up   Patient Goals and CMS Choice Patient states their goals for this hospitalization and ongoing recovery are:: patient too lethargic to voice goals          Expected Discharge Plan and Services       Living arrangements for the past 2 months: [place]                                      Prior Living Arrangements/Services Living arrangements for the past 2 months: [place] with:: Self Patient language and need for interpreter reviewed:: Yes        Need for Family Participation in Patient Care: Yes (Comment) Care giver support system in place?: No (comment)   Criminal Activity/Legal Involvement Pertinent to Current Situation/Hospitalization: No - Comment as needed  Activities of Daily Living      Permission Sought/Granted                  Emotional Assessment Appearance:: Appears stated age Attitude/Demeanor/Rapport: Lethargic   Orientation: : Oriented to Self, Oriented to Place Alcohol / Substance Use: Illicit Drugs Psych Involvement: No (comment)  Admission diagnosis:  Hemorrhagic shock ([name]) [R57.8] Lactic acidosis [E87.20] Acute blood loss anemia [D62] Rectal bleeding [K62.5] Peripheral edema [R60.9] Demand ischemia [I24.89] NSTEMI (non-ST elevated myocardial infarction) ([place]) [I21.4] Cocaine dependence with  cocaine-induced disorder ([place]) [F14.29] Acute kidney injury ([place]) [N17.9] Alcohol dependence with unspecified alcohol-induced disorder ([place]) [F10.29] History of benzodiazepine use [Z87.898] Patient Active Problem List   Diagnosis Date Noted   Hypotension 01/17/2023   Anasarca 01/17/2023   Hemorrhagic shock ([place]) 01/17/2023   Morbid obesity ([place]) 01/17/2023   Acute kidney injury ([place]) 01/17/2023   Cocaine dependence with cocaine-induced disorder ([place]) 01/17/2023   Chest pain 11/19/2022   Demand ischemia 11/05/2022   Acute on chronic heart failure with preserved ejection fraction (HFpEF) ([place]) 10/29/2022   Acute respiratory distress 10/29/2022   Elevated rheumatoid factor 10/28/2022   Hyperkalemia 10/25/2022   Hemorrhoids, internal, with bleeding 10/25/2022   Mesenteric artery stenosis ([place]) 10/23/2022   Iron deficiency anemia 10/23/2022   Hematochezia    Abnormal CT scan, colon    Colitis 09/30/2022   Prolonged QT interval 09/30/2022   Alcohol use disorder 08/19/2022   Acute on chronic blood loss anemia 05/29/2022   GI bleed 05/28/2022   Non-intractable vomiting    Epigastric pain    Polyp of ascending colon    Acute blood loss anemia 08/07/2021   Acute gastritis    Hyperbilirubinemia    Elevated troponin    Notalgia    Non-ST elevation (NSTEMI) myocardial infarction ([place]) 12/19/2020   History of ST elevation myocardial infarction (STEMI) 12/19/2020  Alcohol dependence ([place]) 12/19/2020   Depression with anxiety 10/05/2020   Esophageal reflux 10/05/2020   Headache 10/05/2020   Hyperlipidemia 10/05/2020   Hyponatremia 06/04/2020   Hypomagnesemia 73/42/8768   Chronic systolic CHF (congestive heart failure) ([place]) 06/04/2020   Leukocytosis 06/04/2020   Abdominal pain 06/04/2020   Hypokalemia 06/04/2020   Ischemic cardiomyopathy    Polysubstance abuse ([place])    NSTEMI (non-ST elevated myocardial infarction) ([place]) 07/12/2019   Abdominal pain, RUQ 05/25/2018   Chest pain,  mid sternal 11/17/2012   Cocaine abuse ([place])    Marijuana abuse    Tobacco abuse 02/26/2011   Essential hypertension 02/26/2011   CAD (coronary artery disease) 02/26/2011   Other specified forms of chronic ischemic heart disease 02/26/2011   PCP:  Nickola Major, NP Pharmacy:   [id number] [place] [place] [address]Phone: [phone]: [phone] [facility] [address]: [phone]: [phone]    Social Determinants of Health (SDOH) Social History: SVictor Food Insecurity Present (11/19/2022)  Housing: High Risk (11/19/2022)  Transportation Needs: No Transportation Needs (11/19/2022)  Utilities: Not At Risk (11/19/2022)  Alcohol Screen: Medium Risk (10/01/2022)  Tobacco Use: High Risk (01/17/2023)   SDOH Interventions:     Readmission Risk Interventions    10/22/2022   10:47 AM  Readmission Risk Prevention Plan  Transportation Screening Complete  PCP or Specialist Appt within 3-5 Days Complete  Social Work Consult for [place]anning/Counseling Complete  Palliative Care Screening Not Applicable  Medication Review (Press photographer Complete

## 2023-01-17 NOTE — Hospital Course (Addendum)
 52 year old male with past medical history of morbid obesity, CAD, polysubstance abuse, mesenteric ischemia status post stent and history of recent hemorrhoidal bleeding status post hemorrhoidectomy which was complicated by recurrent bleeding requiring repeat visit to the OR and non-STEMI.  At that time, patient was transfused 1 unit packed red blood cell transfusion.  Patient presented back to the emergency room on the night of 2/1 with complaints of lower extremity swelling and dyspnea on exertion.  Patient found to have acute kidney injury, hypotension with systolic blood pressure in the 80s tachypnea and hemoglobin of 6.3.  Vascular imaging noted active rectal bleeding and patient admitted for hemorrhagic shock with secondary blood loss anemia.  Also felt to have high-output heart failure.  Transfused 2 units packed red blood cells plus blood on IV fluids.  Patient also had elevated troponins which peaked as high as the 400s.  General surgery, gastroenterology and cardiology consulted. Patient was also placed on IV Lasix after initial blood transfusion. 2/9.  Patient condition improved, diuretics switched to oral.  Pending nursing placement.  2/14.  Asking nursing staff to get a peripheral IV so we can take out the PICC line.  Started on Cortef for adrenal insufficiency.  On midodrine for hypotension.  Notified by TOC that we are having trouble finding a rehab bed and likely will have to go home.

## 2023-01-17 NOTE — Consult Note (Signed)
NAME:  Ethan Wells, MRN:  [id number], DOB:  [birth year], LOS: 0 ADMISSION DATE:  01/16/2023, CONSULTATION DATE:  01/17/23 REFERRING MD:  Dr. Damita Dunnings, CHIEF COMPLAINT:  Leg Swelling   History of Present Illness:  52 year old male presenting to [facility] ED from home on 01/17/2023 for evaluation of increasing bilateral lower extremity swelling.  History provided bedside by patient, somewhat poor historian. Patient reports several days of worsening bilateral lower extremity edema and spite of compliance with his medications.  However when asked specifically about his Lasix he stated he started taking that more recently within the last 5 days, and then said he " hates taking pills".  He endorses fatigue and dyspnea with exertion.  He also reports chronic rectal bleeding that has been ongoing even after his hemorrhoidectomy in November 2023.  This bleeding is described as red streaks of blood in his stool with each bowel movement.  He also reported 1 episode of coffee-ground emesis and melena a couple months ago, but this is not repeated.  He denies outright fever/chills, but states he gets cold a lot.  He denies nausea/vomiting/diarrhea or dysuria.  He does report some chronic abdominal discomfort that has been ongoing since November. Patient reports he has been cutting down on his ETOH intake to 22 oz x 2 daily, and yesterday (2/1) he only consumed to 6 oz wine coolers.   ED course: Upon arrival patient A&O x 4 with tachypnea, mild tachycardia and soft but stable BP. Lab work revealed electrolyte derangements, acute anemia, transaminitis, elevated but flat troponin, elevated BNP & lactic acidosis without leukocytosis. CXR clear, CT angio suggestive of small active rectal bleed.  ICU evaluated for admission but at that time vital signs were stable and blood administration was imminent so TRH was consulted for admission.  During blood transfusion patient's blood pressure became persistently hypotensive. PCCM  consulted for vasopressor administration. Medications given: 1 g Tylenol, 40 meq K+, 100 mg thiamine, 2g Mg, 500 mL NS bolus, IV contrast Initial Vitals: 98.5, 37, 101, 97/80 & 100% on RA Significant labs: (Labs/ Imaging personally reviewed) I, Domingo Pulse [place], AGACNP-BC, personally viewed and interpreted this ECG. EKG Interpretation: Date: 02/05/2023, EKG Time: 23:16, Rate: 101, Rhythm: ST, QRS Axis: Normal, Intervals: Normal, ST/T Wave abnormalities: Anterolateral T wave inversions, Narrative Interpretation: Sinus tachycardia with anterior lateral T wave inversions. Chemistry: Na+:128, K+: 2.8, BUN/Cr.: 20/1.45, Serum CO2/ AG: 20/14, Cl: 94, Alk Phos: 271, AST: 99, T. Bili: 1.5, Lipase: 78 Hematology: WBC: 9.0, Hgb: 6.3,  Troponin: 431 > 436, BNP: 1052.4, Lactic: 5.1 > 3.2,   CXR 01/16/23: no active disease CT angio GI bleed 01/17/23: Findings suspicious for small active bleed in rectum.  Severe stenosis at the origin of the celiac axis is re-demonstrated, patent stent at the origin of the SMA.  Wall thickening about the sigmoid colon/rectum as well as the transverse colon favored due to colitis however congestive colopathy could appear similarly/duodenal wall thickening suggesting duodenitis/cirrhosis and borderline splenomegaly compatible with portal hypertension.  PCCM consulted for assistance in management and monitoring due to circulatory shock s/t ABLA from small active rectal bleed.  Pertinent  Medical History  CAD s/p PCI (2012) V-fib arrest (2012) Cocaine & Marijuana abuse ETOH abuse (6-12 beers daily) Chronic hemorrhoid bleeding s/p hemorrhoidectomy Mesenteric Ischemia s/p stent placement HLD HTN ICM HFpEF Tobacco abuse (13 pack year history) Depression & anxiety Significant Hospital Events: Including procedures, antibiotic start and stop dates in addition to other pertinent events   01/17/23: Admit to  SDU with circulatory shock s/t to ABLA from small active rectal bleed.  General surgery consulted, PCCM consulted due to drop in BP- peripheral vasopressors ordered  Interim History / Subjective:  Patient alert and oriented, plan of care discussed and all questions and concerns answered at this time.  Objective   Blood pressure (!) 81/36, pulse 95, temperature (!) 97.4 F (36.3 C), temperature source Oral, resp. rate 13, height '5\' 5"'$  (1.651 m), weight 91.2 kg, SpO2 100 %.        Intake/Output Summary (Last 24 hours) at 01/17/2023 0422 Last data filed at 01/17/2023 0256 Gross per 24 hour  Intake 40.72 ml  Output --  Net 40.72 ml   Filed Weights   01/16/23 2305  Weight: 91.2 kg    Examination: General: Adult male, acutely ill, lying in bed, NAD HEENT: MM pink/moist, anicteric, atraumatic, neck supple Neuro: A&O x 4, able to follow commands, PERRL +3, MAE CV: s1s2 RRR, NSR on monitor, no r/m/g Pulm: Regular, non labored on RA , breath sounds clear-BUL & clear/diminished-BLL GI: soft, rounded, mild tenderness RUQ, bs x 4 Skin: slight redness to left ankle Extremities: warm/dry, pulses + 2 R/P, +2 edema noted BLE  Resolved Hospital Problem list     Assessment & Plan:  Circulatory Shock secondary to ABLA Lactic Acidosis without leukocytosis suspect s/t above PMHx: hemorrhoidal bleeding s/p hemorrhoidectomy 11/23 Patient initially with some soft BP 98'X-21'J systolic but MAP's remaining > 65. BP then dropped into 80's/50's with MAP's in the 50's. PCCM consulted - initiate peripheral levophed, wean as tolerated to maintain MAP > 65 - continue 2 units of pRBC's, Maintain up to date type & screen, Q 6 H&H monitoring - NPO - PPI - Monitor for s/s of bleeding - Trend lactic, daily CBC, PT/ INR monitoring PRN - Transfuse for Hgb <7 - surgery consulted per primary service, appreciate input  Acute on Chronic HFpEF exacerbation Elevated Troponin suspect secondary to demand ischemia PMHx: CAD, N-STEMI, Mesenteric artery stenosis s/p stent - Continuous  cardiac monitoring  - f/u echo - Daily weights to assess volume status - outpatient regimen on hold due to hemodynamics: metoprolol, lasix, imdur - Supplemental oxygen as needed, maintain SpO2 > 90% - f/u venous dopplers -Cardiology consulted per primary service, appreciate input  Acute Kidney Injury suspect pre-renal secondary to circulatory shock in the setting of ABLA Hyponatremia Hypokalemia NAGMA Baseline Cr: 0.46, Cr on admission:1.45 K+ and Mg replaced - Strict I/O's: alert provider if UOP < 0.5 mL/kg/hr - gentle IVF hydration  - Daily BMP, replace electrolytes PRN - Avoid nephrotoxic agents as able, ensure adequate renal perfusion  Transaminitis  - Trend hepatic function - avoid hepatotoxic agents  Polysubstance abuse +cocaine & ETOH abuse - supportive care - agree with CIWA protocol per primary service - thiamine & folic acid, add multivitamin once patient able to take p.o. medications  Best Practice (right click and "Reselect all SmartList Selections" daily)  Diet/type: NPO DVT prophylaxis: SCD GI prophylaxis: PPI Lines: N/A Foley:  N/A Code Status:  full code Last date of multidisciplinary goals of care discussion [per primary]  Labs   CBC: Recent Labs  Lab 01/16/23 2347  WBC 9.0  NEUTROABS 6.1  HGB 6.3*  HCT 20.0*  MCV 88.5  PLT 941    Basic Metabolic Panel: Recent Labs  Lab 01/16/23 2347 01/17/23 0126  NA 128*  --   K 2.8*  --   CL 94*  --   CO2 20*  --  GLUCOSE 109*  --   BUN 20  --   CREATININE 1.45*  --   CALCIUM 7.6*  --   MG  --  1.7   GFR: Estimated Creatinine Clearance: 63.3 mL/min (A) (by C-G formula based on SCr of 1.45 mg/dL (H)). Recent Labs  Lab 01/16/23 2347 01/17/23 0134  WBC 9.0  --   LATICACIDVEN 5.1* 3.2*    Liver Function Tests: Recent Labs  Lab 01/16/23 2347  AST 99*  ALT 24  ALKPHOS 271*  BILITOT 1.5*  PROT 6.8  ALBUMIN 2.6*   Recent Labs  Lab 01/17/23 0043  LIPASE 78*   No results for  input(s): "AMMONIA" in the last 168 hours.  ABG No results found for: "PHART", "PCO2ART", "PO2ART", "HCO3", "TCO2", "ACIDBASEDEF", "O2SAT"   Coagulation Profile: Recent Labs  Lab 01/17/23 0043  INR 1.3*    Cardiac Enzymes: No results for input(s): "CKTOTAL", "CKMB", "CKMBINDEX", "TROPONINI" in the last 168 hours.  HbA1C: Hgb A1c MFr Bld  Date/Time Value Ref Range Status  10/01/2022 01:37 AM 4.7 (L) 4.8 - 5.6 % Final    Comment:    (NOTE) Pre diabetes:          5.7%-6.4%  Diabetes:              >6.4%  Glycemic control for   <7.0% adults with diabetes   06/05/2020 02:03 AM 5.4 4.8 - 5.6 % Final    Comment:    (NOTE)         Prediabetes: 5.7 - 6.4         Diabetes: >6.4         Glycemic control for adults with diabetes: <7.0     CBG: No results for input(s): "GLUCAP" in the last 168 hours.  Review of Systems: Positives in BOLD  Gen: Denies fever, chills, weight change, fatigue, night sweats HEENT: Denies blurred vision, double vision, hearing loss, tinnitus, sinus congestion, rhinorrhea, sore throat, neck stiffness, dysphagia PULM: Denies shortness of breath, cough, sputum production, hemoptysis, wheezing CV: Denies chest pain, edema, orthopnea, paroxysmal nocturnal dyspnea, palpitations GI: Denies abdominal pain, nausea, vomiting, diarrhea, hematochezia, melena, constipation, change in bowel habits GU: Denies dysuria, hematuria, polyuria, oliguria, urethral discharge Endocrine: Denies hot or cold intolerance, polyuria, polyphagia or appetite change Derm: Denies rash, dry skin, scaling or peeling skin change Heme: Denies easy bruising, bleeding, bleeding gums Neuro: Denies headache, numbness, weakness, slurred speech, loss of memory or consciousness  Past Medical History:  He,  has a past medical history of CAD (coronary artery disease), Cardiac arrest - ventricular fibrillation, Chronic pain, Cocaine abuse ([place]), Depression with anxiety, ETOH abuse, GERD  (gastroesophageal reflux disease), Hemorrhoids, Hyperlipidemia, Hypertension, Ischemic cardiomyopathy, Marijuana abuse, Migraines, and Tobacco abuse.   Surgical History:   Past Surgical History:  Procedure Laterality Date   COLONOSCOPY  08/09/2021   Procedure: COLONOSCOPY;  Surgeon: Lucilla Lame, MD;  Location: [facility] ENDOSCOPY;  Service: Endoscopy;;   CORONARY ANGIOPLASTY WITH STENT PLACEMENT     ESOPHAGOGASTRODUODENOSCOPY N/A 05/30/2022   Procedure: ESOPHAGOGASTRODUODENOSCOPY (EGD);  Surgeon: Lin Landsman, MD;  Location[facility] ENDOSCOPY;  Service: Gastroenterology;  Laterality: N/A;   ESOPHAGOGASTRODUODENOSCOPY (EGD) WITH PROPOFOL N/A 03/01/2021   Procedure: ESOPHAGOGASTRODUODENOSCOPY (EGD) WITH PROPOFOL;  Surgeon: Lin Landsman, MD;  Location: [facility];  Service: Endoscopy;  Laterality: N/A;   ESOPHAGOGASTRODUODENOSCOPY (EGD) WITH PROPOFOL N/A 08/09/2021   Procedure: ESOPHAGOGASTRODUODENOSCOPY (EGD) WITH PROPOFOL;  Surgeon: Lucilla Lame, MD;  Location: [facility] ENDOSCOPY;  Service: Endoscopy;  Laterality: N/A;  EVALUATION UNDER ANESTHESIA WITH HEMORRHOIDECTOMY N/A 10/31/2022   Procedure: EXAM UNDER ANESTHESIA WITH SUTURE LIGATION OF TWO BLEEDING PEDICLES;  Surgeon: Olean Ree, MD;  Location: [facility] ORS;  Service: General;  Laterality: N/A;   FLEXIBLE SIGMOIDOSCOPY N/A 10/02/2022   Procedure: FLEXIBLE SIGMOIDOSCOPY;  Surgeon: Ladene Artist, MD;  Location: [facility];  Service: Gastroenterology;  Laterality: N/A;   LEFT HEART CATH AND CORONARY ANGIOGRAPHY N/A 11/04/2022   Procedure: LEFT HEART CATH AND CORONARY ANGIOGRAPHY;  Surgeon: Wellington Hampshire, MD;  Location: [facility];  Service: Cardiovascular;  Laterality: N/A;   VISCERAL ANGIOGRAPHY N/A 10/24/2022   Procedure: VISCERAL ANGIOGRAPHY;  Surgeon: Algernon Huxley, MD;  Location: [facility] CV LAB;  Service: Cardiovascular;  Laterality: N/A;     Social History:   reports that he has been smoking  cigarettes. He has a 20.50 pack-year smoking history. He has never used smokeless tobacco. He reports current alcohol use of about 12.0 standard drinks of alcohol per week. He reports that he does not currently use drugs after having used the following drugs: Cocaine and Marijuana.   Family History:  His family history includes Alcohol abuse in his father; Coronary artery disease in his mother; Other in his paternal grandmother and another family member.   Allergies Allergies  Allergen Reactions   Penicillins Hives    Tolerated Ceftriaxone 10/2022   Other Rash    chives     Home Medications  Prior to Admission medications   Medication Sig Start Date End Date Taking? Authorizing Provider  acetaminophen (TYLENOL) 500 MG tablet Take 2 tablets (1,000 mg total) by mouth every 6 (six) hours as needed for mild pain. 10/28/22   Olean Ree, MD  aspirin 81 MG chewable tablet Chew 1 tablet (81 mg total) by mouth daily. 11/06/22 02/04/23  Enzo Bi, MD  calcium-vitamin D (OSCAL WITH D) 500-5 MG-MCG tablet Take 1 tablet by mouth 2 (two) times daily. 10/17/22   Medina-Vargas, Monina C, NP  chlordiazePOXIDE (LIBRIUM) 5 MG capsule Take 3 tablets by mouth twice a day x 2 days; then 2 tablets by mouth twice a day x 2 days; then 1 tablet by mouth 3 times a day x 2 days; then 1 tablet by mouth twice a day x 2 days; then 1 tablet by mouth daily x 3 days and stop Librium. Patient not taking: Reported on 01/17/2023 11/21/22   Barton Dubois, MD  ezetimibe (ZETIA) 10 MG tablet Take 10 mg by mouth daily.    [provider]  ferrous sulfate 325 (65 FE) MG tablet Take 1 tablet (325 mg total) by mouth daily with breakfast. 11/06/22 02/04/23  Enzo Bi, MD  folic acid (FOLVITE) 1 MG tablet Take 1 tablet (1 mg total) by mouth daily. 11/06/22   Enzo Bi, MD  furosemide (LASIX) 40 MG tablet Take 1 tablet (40 mg total) by mouth daily. Patient taking differently: Take 40 mg by mouth 2 (two) times daily. 11/21/22  02/19/23  Barton Dubois, MD  gabapentin (NEURONTIN) 300 MG capsule Take 1 capsule (300 mg total) by mouth 3 (three) times daily. 10/17/22   Medina-Vargas, Monina C, NP  isosorbide mononitrate (IMDUR) 30 MG 24 hr tablet Take 0.5 tablets (15 mg total) by mouth daily. 11/22/22   Barton Dubois, MD  lidocaine (XYLOCAINE) 5 % ointment Apply 1 Application topically 4 (four) times daily as needed. Apply to the perianal area Patient not taking: Reported on 01/17/2023 10/28/22   Olean Ree, MD  magnesium oxide (MAG-OX)  400 (240 Mg) MG tablet Take 1 tablet (400 mg total) by mouth 2 (two) times daily. 11/06/22   Enzo Bi, MD  methocarbamol (ROBAXIN) 750 MG tablet TAKE 1 TABLET (750 MG TOTAL) BY MOUTH DAILY AS NEEDED FOR MUSCLE SPASMS 12/23/22   Medina-Vargas, Monina C, NP  metoprolol tartrate (LOPRESSOR) 25 MG tablet Take 12.5 mg by mouth 2 (two) times daily.    Enzo Bi, MD  midodrine (PROAMATINE) 2.5 MG tablet Take 1 tablet (2.5 mg total) by mouth 3 (three) times daily with meals. Patient not taking: Reported on 01/17/2023 11/24/22   Barton Dubois, MD  Multiple Vitamins-Minerals (MULTIVITAMIN) tablet Take 1 tablet by mouth daily. 11/06/22 02/04/23  Enzo Bi, MD  nicotine (NICODERM CQ - DOSED IN MG/24 HOURS) 14 mg/24hr patch Place 1 patch (14 mg total) onto the skin daily. 11/21/22   Barton Dubois, MD  nitroGLYCERIN (NITROSTAT) 0.4 MG SL tablet Place 1 tablet (0.4 mg total) under the tongue every 5 (five) minutes as needed for chest pain. 11/06/22   Enzo Bi, MD  pantoprazole (PROTONIX) 40 MG tablet TAKE 1 TABLET BY MOUTH EVERY DAY Patient taking differently: Take 40 mg by mouth 2 (two) times daily. 12/23/22   Medina-Vargas, Monina C, NP  polyethylene glycol (MIRALAX / GLYCOLAX) 17 g packet Take 17 g by mouth daily. 11/06/22 02/04/23  Enzo Bi, MD  psyllium (HYDROCIL/METAMUCIL) 95 % PACK Take 1 packet by mouth daily. 11/06/22 02/04/23  Enzo Bi, MD  rosuvastatin (CRESTOR) 40 MG tablet TAKE 1 TABLET BY MOUTH EVERY  DAY 12/19/22   Medina-Vargas, Monina C, NP  thiamine (VITAMIN B-1) 100 MG tablet Take 1 tablet (100 mg total) by mouth daily. 11/06/22   Enzo Bi, MD     Critical care time: 60 minutes       Venetia Night, AGACNP-BC Acute Care Nurse Practitioner [facility]   [phone] / [phone] Please see Amion for pager details.

## 2023-01-17 NOTE — H&P (Addendum)
History and Physical    [name]:[id number] DOB: 1971/08/25 DOA: 01/16/2023 DOS: the patient was seen and examined on 01/17/2023 PCP: Nickola Major, NP  Patient coming from: Home  Chief Complaint:  Chief Complaint  Patient presents with   Leg Swelling    HPI: Ethan Wells is a 52 y.o. male with medical history significant for polysubstance abuse, coronary artery disease, anemia due to hemorrhoids, chronic pain syndrome  mesenteric ischemias/p stent , hemorrhoidal bleeding s/p hemorrhoidectomy 26/37/8588 complicated by takeback to the OR due to repeat as well as NSTEMI for which she could not complete heparin infusion because of rectal bleeding.  He was transfused 1 unit PRBCs during that admission and also treated for CHF exacerbation.  He now presents with a complaint of several days of bilateral lower extremity swelling and spite of compliance with his medication.  He reports feeling very fatigued, like he has no energy.  Notes dyspnea with exertion.  Denies chest pain and he denies lower extremity pain.  States he sees red streaks of blood in his stool with each bowel movement.  Had an episode of coffee-ground emesis and melena a couple months prior but none since.  Has occasional epigastric discomfort but overall denies nausea or vomiting and denies diarrhea or dysuria. ED course and data review: Hypotensive to 83/52 on arrival and tachypneic in the 20s to 30s.  .Hemoglobin 6.3, down from 11.1 a month prior with lactic acid 5.1.  Creatinine 1.45, up from baseline of 0.46.  Potassium 2.8, sodium 128, abnormal LFTs..  Troponin 431 and BNP 1052.  Lipase and LFTs elevated with lipase 78, AST 99, ALT 24 and alk phos 271.  EKG, personally viewed and interpreted showing sinus tachycardia at 101.  Chest x-ray clear. ICU was consulted but they did not think patient met ICU criteria.  Hospitalist subsequently consulted. CTA was requested that showed active rectal  bleeding as follows: IMPRESSION: VASCULAR  Findings suspicious for small active bleed in the rectum.  Severe stenosis at the origin of the celiac axis is redemonstrated. Patent stent at the origin of the SMA.  Patient admitted to stepdown, Icu consulted   Review of Systems: As mentioned in the history of present illness. All other systems reviewed and are negative.  Past Medical History:  Diagnosis Date   CAD (coronary artery disease)    a. 01/2011 Anterior STEMI/Cath/PCI: LM nl, LAD 100d (3.5x39m Vision BMS placed), LCX 233mRI 50, RCA min irregs, EF 40% w/ apical, inferoapical HK.   Cardiac arrest - ventricular fibrillation    a. In setting of STEMI 01/2011   Chronic pain    Cocaine abuse ([place]   Depression with anxiety    ETOH abuse    a. 6-12 beers/day   GERD (gastroesophageal reflux disease)    Hemorrhoids    Hyperlipidemia    Hypertension    Ischemic cardiomyopathy    a. 06/2011 Echo: EF 45-50%, No rwma   Marijuana abuse    Migraines    Tobacco abuse    a. 1/2 ppd x 26 yrs   Past Surgical History:  Procedure Laterality Date   COLONOSCOPY  08/09/2021   Procedure: COLONOSCOPY;  Surgeon: WoLucilla LameMD;  [facility];  Service: Endoscopy;;   CORONARY ANGIOPLASTY WITH STENT PLACEMENT     ESOPHAGOGASTRODUODENOSCOPY N/A 05/30/2022   Procedure: ESOPHAGOGASTRODUODENOSCOPY (EGD);  Surgeon: VaLin LandsmanMD;  [facility];  Service: Gastroenterology;  Laterality: N/A;   ESOPHAGOGASTRODUODENOSCOPY (EGD) WITH  PROPOFOL N/A 03/01/2021   Procedure: ESOPHAGOGASTRODUODENOSCOPY (EGD) WITH PROPOFOL;  Surgeon: Lin Landsman, MD;  Location: [place];  Service: Endoscopy;  Laterality: N/A;   ESOPHAGOGASTRODUODENOSCOPY (EGD) WITH PROPOFOL N/A 08/09/2021   Procedure: ESOPHAGOGASTRODUODENOSCOPY (EGD) WITH PROPOFOL;  Surgeon: Lucilla Lame, MD;  Location: [facility] ENDOSCOPY;  Service: Endoscopy;  Laterality: N/A;   EVALUATION UNDER ANESTHESIA WITH  HEMORRHOIDECTOMY N/A 10/31/2022   Procedure: EXAM UNDER ANESTHESIA WITH SUTURE LIGATION OF TWO BLEEDING PEDICLES;  Surgeon: Olean Ree, MD;  Location: [facility];  Service: General;  Laterality: N/A;   FLEXIBLE SIGMOIDOSCOPY N/A 10/02/2022   Procedure: FLEXIBLE SIGMOIDOSCOPY;  Surgeon: Ladene Artist, MD;  Location: [place];  Service: Gastroenterology;  Laterality: N/A;   LEFT HEART CATH AND CORONARY ANGIOGRAPHY N/A 11/04/2022   Procedure: LEFT HEART CATH AND CORONARY ANGIOGRAPHY;  Surgeon: Wellington Hampshire, MD;  Location: [place] [facility];  Service: Cardiovascular;  Laterality: N/A;   VISCERAL ANGIOGRAPHY N/A 10/24/2022   Procedure: VISCERAL ANGIOGRAPHY;  Surgeon: Algernon Huxley, MD;  Location: [place] [facility];  Service: Cardiovascular;  Laterality: N/A;   Social History:  reports that he has been smoking cigarettes. He has a 20.50 pack-year smoking history. He has never used smokeless tobacco. He reports current alcohol use of about 12.0 standard drinks of alcohol per week. He reports that he does not currently use drugs after having used the following drugs: Cocaine and Marijuana.  Allergies  Allergen Reactions   Penicillins Hives    Tolerated Ceftriaxone 10/2022   Other Rash    chives    Family History  Problem Relation Age of Onset   Coronary artery disease Mother        alive   Other Other        2 sisters alive and well   Alcohol abuse Father        died @ 21   Other Paternal Grandmother        PACER    Prior to Admission medications   Medication Sig Start Date End Date Taking? Authorizing Provider  acetaminophen (TYLENOL) 500 MG tablet Take 2 tablets (1,000 mg total) by mouth every 6 (six) hours as needed for mild pain. 10/28/22   Olean Ree, MD  aspirin 81 MG chewable tablet Chew 1 tablet (81 mg total) by mouth daily. 11/06/22 02/04/23  Enzo Bi, MD  calcium-vitamin D (OSCAL WITH D) 500-5 MG-MCG tablet Take 1 tablet by mouth 2 (two) times daily. 10/17/22    Medina-Vargas, Monina C, NP  chlordiazePOXIDE (LIBRIUM) 5 MG capsule Take 3 tablets by mouth twice a day x 2 days; then 2 tablets by mouth twice a day x 2 days; then 1 tablet by mouth 3 times a day x 2 days; then 1 tablet by mouth twice a day x 2 days; then 1 tablet by mouth daily x 3 days and stop Librium. Patient not taking: Reported on 01/17/2023 11/21/22   Barton Dubois, MD  ezetimibe (ZETIA) 10 MG tablet Take 10 mg by mouth daily.    [provider]  ferrous sulfate 325 (65 FE) MG tablet Take 1 tablet (325 mg total) by mouth daily with breakfast. 11/06/22 02/04/23  Enzo Bi, MD  folic acid (FOLVITE) 1 MG tablet Take 1 tablet (1 mg total) by mouth daily. 11/06/22   Enzo Bi, MD  furosemide (LASIX) 40 MG tablet Take 1 tablet (40 mg total) by mouth daily. Patient taking differently: Take 40 mg by mouth 2 (two) times daily. 11/21/22 02/19/23  Barton Dubois, MD  gabapentin (NEURONTIN) 300 MG capsule Take 1 capsule (300 mg total) by mouth 3 (three) times daily. 10/17/22   Medina-Vargas, Monina C, NP  isosorbide mononitrate (IMDUR) 30 MG 24 hr tablet Take 0.5 tablets (15 mg total) by mouth daily. 11/22/22   Barton Dubois, MD  lidocaine (XYLOCAINE) 5 % ointment Apply 1 Application topically 4 (four) times daily as needed. Apply to the perianal area Patient not taking: Reported on 01/17/2023 10/28/22   Olean Ree, MD  magnesium oxide (MAG-OX) 400 (240 Mg) MG tablet Take 1 tablet (400 mg total) by mouth 2 (two) times daily. 11/06/22   Enzo Bi, MD  methocarbamol (ROBAXIN) 750 MG tablet TAKE 1 TABLET (750 MG TOTAL) BY MOUTH DAILY AS NEEDED FOR MUSCLE SPASMS 12/23/22   Medina-Vargas, Monina C, NP  metoprolol tartrate (LOPRESSOR) 25 MG tablet Take 12.5 mg by mouth 2 (two) times daily.    Enzo Bi, MD  midodrine (PROAMATINE) 2.5 MG tablet Take 1 tablet (2.5 mg total) by mouth 3 (three) times daily with meals. Patient not taking: Reported on 01/17/2023 11/24/22   Barton Dubois, MD  Multiple  Vitamins-Minerals (MULTIVITAMIN) tablet Take 1 tablet by mouth daily. 11/06/22 02/04/23  Enzo Bi, MD  nicotine (NICODERM CQ - DOSED IN MG/24 HOURS) 14 mg/24hr patch Place 1 patch (14 mg total) onto the skin daily. 11/21/22   Barton Dubois, MD  nitroGLYCERIN (NITROSTAT) 0.4 MG SL tablet Place 1 tablet (0.4 mg total) under the tongue every 5 (five) minutes as needed for chest pain. 11/06/22   Enzo Bi, MD  pantoprazole (PROTONIX) 40 MG tablet TAKE 1 TABLET BY MOUTH EVERY DAY Patient taking differently: Take 40 mg by mouth 2 (two) times daily. 12/23/22   Medina-Vargas, Monina C, NP  polyethylene glycol (MIRALAX / GLYCOLAX) 17 g packet Take 17 g by mouth daily. 11/06/22 02/04/23  Enzo Bi, MD  psyllium (HYDROCIL/METAMUCIL) 95 % PACK Take 1 packet by mouth daily. 11/06/22 02/04/23  Enzo Bi, MD  rosuvastatin (CRESTOR) 40 MG tablet TAKE 1 TABLET BY MOUTH EVERY DAY 12/19/22   Medina-Vargas, Monina C, NP  thiamine (VITAMIN B-1) 100 MG tablet Take 1 tablet (100 mg total) by mouth daily. 11/06/22   Enzo Bi, MD    Physical Exam: Vitals:   01/17/23 0200 01/17/23 0300 01/17/23 0338 01/17/23 0353  BP: 91/71 (!) 87/50 (!) 89/50 (!) 81/36  Pulse: 97 91 94 95  Resp: '19  16 13  '$ Temp:   (!) 97.5 F (36.4 C) (!) 97.4 F (36.3 C)  TempSrc:   Oral Oral  SpO2: 100% 100% 99% 100%  Weight:      Height:       Physical Exam Vitals and nursing note reviewed.  Constitutional:      General: He is not in acute distress. HENT:     Head: Normocephalic and atraumatic.  Cardiovascular:     Rate and Rhythm: Normal rate and regular rhythm.     Heart sounds: Normal heart sounds.  Pulmonary:     Effort: Pulmonary effort is normal.     Breath sounds: Normal breath sounds.  Abdominal:     Palpations: Abdomen is soft.     Tenderness: There is no abdominal tenderness.  Musculoskeletal:     Right lower leg: Edema present.     Left lower leg: Edema present.  Skin:    Coloration: Skin is pale.  Neurological:      Mental Status: Mental status is at baseline.  Labs on Admission: I have personally reviewed following labs and imaging studies  CBC: Recent Labs  Lab 01/16/23 2347  WBC 9.0  NEUTROABS 6.1  HGB 6.3*  HCT 20.0*  MCV 88.5  PLT 762   Basic Metabolic Panel: Recent Labs  Lab 01/16/23 2347 01/17/23 0126  NA 128*  --   K 2.8*  --   CL 94*  --   CO2 20*  --   GLUCOSE 109*  --   BUN 20  --   CREATININE 1.45*  --   CALCIUM 7.6*  --   MG  --  1.7   GFR: Estimated Creatinine Clearance: 63.3 mL/min (A) (by C-G formula based on SCr of 1.45 mg/dL (H)). Liver Function Tests: Recent Labs  Lab 01/16/23 2347  AST 99*  ALT 24  ALKPHOS 271*  BILITOT 1.5*  PROT 6.8  ALBUMIN 2.6*   Recent Labs  Lab 01/17/23 0043  LIPASE 78*   No results for input(s): "AMMONIA" in the last 168 hours. Coagulation Profile: Recent Labs  Lab 01/17/23 0043  INR 1.3*   Cardiac Enzymes: No results for input(s): "CKTOTAL", "CKMB", "CKMBINDEX", "TROPONINI" in the last 168 hours. BNP (last 3 results) No results for input(s): "PROBNP" in the last 8760 hours. HbA1C: No results for input(s): "HGBA1C" in the last 72 hours. CBG: No results for input(s): "GLUCAP" in the last 168 hours. Lipid Profile: No results for input(s): "CHOL", "HDL", "LDLCALC", "TRIG", "CHOLHDL", "LDLDIRECT" in the last 72 hours. Thyroid Function Tests: No results for input(s): "TSH", "T4TOTAL", "FREET4", "T3FREE", "THYROIDAB" in the last 72 hours. Anemia Panel: No results for input(s): "VITAMINB12", "FOLATE", "FERRITIN", "TIBC", "IRON", "RETICCTPCT" in the last 72 hours. Urine analysis:    Component Value Date/Time   COLORURINE AMBER (A) 01/16/2023 2347   APPEARANCEUR HAZY (A) 01/16/2023 2347   APPEARANCEUR Clear 03/16/2012 1315   LABSPEC 1.015 01/16/2023 2347   LABSPEC 1.013 03/16/2012 1315   PHURINE 5.0 01/16/2023 2347   GLUCOSEU NEGATIVE 01/16/2023 2347   GLUCOSEU 50 mg/dL 03/16/2012 1315   HGBUR NEGATIVE  01/16/2023 2347   BILIRUBINUR NEGATIVE 01/16/2023 2347   BILIRUBINUR Negative 03/16/2012 [place] 01/16/2023 2347   PROTEINUR 30 (A) 01/16/2023 2347   NITRITE NEGATIVE 01/16/2023 2347   LEUKOCYTESUR NEGATIVE 01/16/2023 2347   LEUKOCYTESUR Negative 03/16/2012 1315    Radiological Exams on Admission: CT ANGIO GI BLEED  Result Date: 01/17/2023 CLINICAL DATA:  Acute blood loss anemia with rectal bleeding EXAM: CTA ABDOMEN AND PELVIS WITHOUT AND WITH CONTRAST TECHNIQUE: Multidetector CT imaging of the abdomen and pelvis was performed using the standard protocol during bolus administration of intravenous contrast. Multiplanar reconstructed images and MIPs were obtained and reviewed to evaluate the vascular anatomy. RADIATION DOSE REDUCTION: This exam was performed according to the departmental dose-optimization program which includes automated exposure control, adjustment of the mA and/or kV according to patient size and/or use of iterative reconstruction technique. CONTRAST:  136m OMNIPAQUE IOHEXOL 350 MG/ML SOLN COMPARISON:  None Available. FINDINGS: VASCULAR Aorta: Normal caliber aorta without aneurysm, dissection, vasculitis or significant stenosis. Scattered calcified atherosclerotic plaque. Celiac: Severe focal stenosis at the origin of the celiac artery similar to 09/30/2022. SMA: Interval placement of a stent at the origin of the SMA. The stent is patent. Renals: Both renal arteries are patent without evidence of aneurysm, dissection, vasculitis, fibromuscular dysplasia or significant stenosis. IMA: Patent without evidence of aneurysm, dissection, vasculitis or significant stenosis. Inflow: Patent without evidence of aneurysm, dissection, vasculitis or significant stenosis. Proximal  Outflow: Bilateral common femoral and visualized portions of the superficial and profunda femoral arteries are patent without evidence of aneurysm, dissection, vasculitis or significant stenosis. Veins:  Patent portal vein and IVC. Review of the MIP images confirms the above findings. NON-VASCULAR Lower chest: No acute abnormality. Hepatobiliary: Cirrhosis. Nondistended gallbladder. Gallbladder wall thickening and pericholecystic fluid favored related to cirrhosis/volume status. No biliary dilation. Pancreas: Unremarkable. Spleen: The spleen measures 14.5 cm in craniocaudal dimension. There is at the upper limits of normal in size. Adrenals/Urinary Tract: Adrenal glands are unremarkable. Kidneys are normal, without renal calculi, focal lesion, or hydronephrosis. Bladder is unremarkable. Stomach/Bowel: There is a focal hyperdensity on arterial phase images (series 11/image 1041) which diffuse is on portal venous phase and is not present on noncontrast images. This is compatible with small focus of active bleed. Hyperdense stool is present throughout the colon on noncontrast images and limits assessment for additional active bleeding. Normal caliber large and small bowel. Wall thickening about the sigmoid colon and rectum. Additional wall thickening versus underdistention of the ascending colon. Wall thickening and adjacent stranding and fluid about the duodenum. Lymphatic: No suspicious lymphadenopathy. Reproductive: Unremarkable. Other: Small amount of free fluid in the pelvis. Central mesenteric edema. No free intraperitoneal air. Musculoskeletal: No acute osseous abnormality. IMPRESSION: VASCULAR Findings suspicious for small active bleed in the rectum. Severe stenosis at the origin of the celiac axis is redemonstrated. Patent stent at the origin of the SMA. NON-VASCULAR Wall thickening about the sigmoid colon/rectum as well as the transverse colon favored due to colitis however congestive colopathy could appear similarly. Duodenal wall thickening suggesting duodenitis. Cirrhosis and borderline splenomegaly compatible with portal hypertension. These results were called by telephone at the time of interpretation on  01/17/2023 at 3:19 am to provider [facility] , who verbally acknowledged these results. Electronically Signed   By: Placido Sou M.D.   On: 01/17/2023 03:25   DG Chest Portable 1 View  Result Date: 01/16/2023 CLINICAL DATA:  Volume overload EXAM: PORTABLE CHEST 1 VIEW COMPARISON:  12/12/2022 FINDINGS: Cardiac shadow is enlarged but stable. No significant vascular congestion is noted. No edema is seen. The lungs are clear. No bony abnormality is noted. IMPRESSION: No active disease. Electronically Signed   By: Inez Catalina M.D.   On: 01/16/2023 23:42     Data Reviewed: Relevant notes from primary care and specialist visits, past discharge summaries as available in EHR, including Care Everywhere. Prior diagnostic testing as pertinent to current admission diagnoses Updated medications and problem lists for reconciliation ED course, including vitals, labs, imaging, treatment and response to treatment Triage notes, nursing and pharmacy notes and ED provider's notes Notable results as noted in HPI   Assessment and Plan: * Hemorrhagic shock ([place]) Acute on chronic blood loss anemia/symptomatic anemia Hematochezia, suspect secondary to hemorrhoidal bleeding History of hemorrhoidectomy November 2023 Patient has a history of bleeding hemorrhoids requiring hemorrhoidectomy in November 1610 complicated by rebleed and now reports bleeding with each BM CTA showing "Findings suspicious for small active bleed in the rectum".  Patient with SBP in the 80s and 90s in the ED, though has been hypotensive in the past (midodrine seen on med list) Received an IV fluid bolus of 500 mL and subsequently started on 1 unit PRBC at the time of request for admission Continue transfusion of 2 units PRBCs Serial H&H Discussed with surgery,Dr Christian Mate, advised to fluid resuscitate and keep npo Discussed with ICU NP Cynda Ac[place], discussed with ED provider, Dr. Karma Greaser  Acute on  chronic heart failure with preserved  ejection fraction (HFpEF) ([place]) Anasarca Patient with bilateral lower extremity edema, BNP over thousand but chest x-ray clear Suspect CHF exacerbation secondary to anemia Will treat anemia with blood transfusions Hold off on CHF meds of metoprolol, isosorbide and furosemide due to low blood pressures Given lower extremity swelling with clear chest x-ray, will get bilateral lower extremity venous Dopplers  Non-ST elevation (NSTEMI) myocardial infarction [facility]) CAD Troponin elevated to the 400s but without chest pain or EKG changes Suspecting NSTEMI secondary to supply/demand mismatch albeit with known history of obstructive CAD Patient had cath November 2023 that showed new stenosis of mid left circumflex and mid RCA Hold off on aspirin due to bleeding Keeping n.p.o. so for right now holding rosuvastatin, metoprolol, isosorbide ezetimibe and aspirin Cardiology consult, [facility]  Mesenteric artery stenosis ([place]) S/p stent 10/2022  Alcohol use disorder CIWA withdrawal protocol  Cocaine abuse ([place]) UDS positive for cocaine     CRITICAL CARE Performed by: Athena Masse   Total critical care time: 120 minutes  Critical care time was exclusive of separately billable procedures and treating other patients.  Critical care was necessary to treat or prevent imminent or life-threatening deterioration.  Critical care was time spent personally by me on the following activities: development of treatment plan with patient and/or surrogate as well as nursing, discussions with consultants, evaluation of patient's response to treatment, examination of patient, obtaining history from patient or surrogate, ordering and performing treatments and interventions, ordering and review of laboratory studies, ordering and review of radiographic studies, pulse oximetry and re-evaluation of patient's condition.    DVT prophylaxis: SCD  Consults: surgery, Dr. Milas Gain, cardiology, [facility] Dr Garen Lah,  intensivist  Advance Care Planning:   Code Status: Prior   Family Communication: none  Disposition Plan: Back to previous home environment  Severity of Illness: The appropriate patient status for this patient is INPATIENT. Inpatient status is judged to be reasonable and necessary in order to provide the required intensity of service to ensure the patient's safety. The patient's presenting symptoms, physical exam findings, and initial radiographic and laboratory data in the context of their chronic comorbidities is felt to place them at high risk for further clinical deterioration. Furthermore, it is not anticipated that the patient will be medically stable for discharge from the hospital within 2 midnights of admission.   * I certify that at the point of admission it is my clinical judgment that the patient will require inpatient hospital care spanning beyond 2 midnights from the point of admission due to high intensity of service, high risk for further deterioration and high frequency of surveillance required.*  Author: Athena Masse, MD 01/17/2023 4:06 AM  For on call review [email or web address].

## 2023-01-17 NOTE — Progress Notes (Signed)
*  PRELIMINARY RESULTS* Echocardiogram 2D Echocardiogram has been performed.  Ethan Wells 01/17/2023, 1:38 PM

## 2023-01-17 NOTE — Progress Notes (Signed)
Peripherally Inserted Central Catheter Placement  The IV Nurse has discussed with the patient and/or persons authorized to consent for the patient, the purpose of this procedure and the potential benefits and risks involved with this procedure.  The benefits include less needle sticks, lab draws from the catheter, and the patient may be discharged home with the catheter. Risks include, but not limited to, infection, bleeding, blood clot (thrombus formation), and puncture of an artery; nerve damage and irregular heartbeat and possibility to perform a PICC exchange if needed/ordered by physician.  Alternatives to this procedure were also discussed.  Bard Power PICC patient education guide, fact sheet on infection prevention and patient information card has been provided to patient /or left at bedside.    PICC Placement Documentation  PICC Triple Lumen 23/53/61 Right Basilic 41 cm 0 cm (Active)  Indication for Insertion or Continuance of Line Poor Vasculature-patient has had multiple peripheral attempts or PIVs lasting less than 24 hours 01/17/23 1845  Exposed Catheter (cm) 0 cm 01/17/23 1845  Site Assessment Clean, Dry, Intact 01/17/23 1845  Lumen #1 Status Flushed;Saline locked;Blood return noted 01/17/23 1845  Lumen #2 Status Flushed;Saline locked;Blood return noted 01/17/23 1845  Lumen #3 Status Flushed;Saline locked;Blood return noted 01/17/23 1845  Dressing Type Transparent;Securing device 01/17/23 1845  Dressing Status Antimicrobial disc in place 01/17/23 1845  Safety Lock Not Applicable 44/31/54 0086  Line Adjustment (NICU/IV Team Only) No 01/17/23 1845  Dressing Intervention New dressing;Other (Comment) 01/17/23 1845  Dressing Change Due 01/24/23 01/17/23 1845       Enos Fling 01/17/2023, 6:46 PM

## 2023-01-17 NOTE — Assessment & Plan Note (Addendum)
Secondary to acute on chronic blood loss anemia/symptomatic anemia Hematochezia, suspect secondary to hemorrhoidal bleeding History of hemorrhoidectomy November 2023 Patient has a history of bleeding hemorrhoids requiring hemorrhoidectomy in November 2633 complicated by rebleed and now reports bleeding with each BM CTA showing "Findings suspicious for small active bleed in the rectum".  Treat with IV fluids and blood transfusions.  General surgery and GI following.  Off of pressor support.  Hemoglobin relatively stable, unchanged in last 12 hours.  With normal urine drug screen now, GI plans to take patient tentatively for EGD and colonoscopy tomorrow.

## 2023-01-18 ENCOUNTER — Encounter: Payer: Self-pay | Admitting: Internal Medicine

## 2023-01-18 ENCOUNTER — Other Ambulatory Visit: Payer: Medicare HMO

## 2023-01-18 ENCOUNTER — Encounter: Payer: Self-pay | Admitting: Anesthesiology

## 2023-01-18 ENCOUNTER — Inpatient Hospital Stay: Payer: Medicare HMO

## 2023-01-18 ENCOUNTER — Encounter
Admission: EM | Disposition: A | Discharge status: Home / Self Care | Payer: Self-pay | Source: Home / Self Care | Attending: Internal Medicine

## 2023-01-18 DIAGNOSIS — I5033 Acute on chronic diastolic (congestive) heart failure: Secondary | ICD-10-CM | POA: Diagnosis not present

## 2023-01-18 DIAGNOSIS — I2510 Atherosclerotic heart disease of native coronary artery without angina pectoris: Secondary | ICD-10-CM | POA: Diagnosis not present

## 2023-01-18 DIAGNOSIS — K648 Other hemorrhoids: Secondary | ICD-10-CM

## 2023-01-18 DIAGNOSIS — F1090 Alcohol use, unspecified, uncomplicated: Secondary | ICD-10-CM

## 2023-01-18 DIAGNOSIS — I2489 Other forms of acute ischemic heart disease: Secondary | ICD-10-CM

## 2023-01-18 DIAGNOSIS — I214 Non-ST elevation (NSTEMI) myocardial infarction: Secondary | ICD-10-CM | POA: Diagnosis not present

## 2023-01-18 DIAGNOSIS — D62 Acute posthemorrhagic anemia: Secondary | ICD-10-CM | POA: Diagnosis not present

## 2023-01-18 DIAGNOSIS — F1410 Cocaine abuse, uncomplicated: Secondary | ICD-10-CM

## 2023-01-18 DIAGNOSIS — R578 Other shock: Secondary | ICD-10-CM | POA: Diagnosis not present

## 2023-01-18 LAB — LACTIC ACID, PLASMA
Lactic Acid, Venous: 1.4 mmol/L (ref 0.5–1.9)
Lactic Acid, Venous: 0.9 mmol/L (ref 0.5–1.9)

## 2023-01-18 LAB — PREPARE FRESH FROZEN PLASMA
Unit division: 0
Unit division: 0

## 2023-01-18 LAB — CBC WITH DIFFERENTIAL/PLATELET
Abs Immature Granulocytes: 0.05 10*3/uL (ref 0.00–0.07)
Basophils Absolute: 0.1 10*3/uL (ref 0.0–0.1)
Basophils Relative: 1 %
Eosinophils Absolute: 0.1 10*3/uL (ref 0.0–0.5)
Eosinophils Relative: 1 %
HCT: 27.6 % — ABNORMAL LOW (ref 39.0–52.0)
Hemoglobin: 9.3 g/dL — ABNORMAL LOW (ref 13.0–17.0)
Immature Granulocytes: 1 %
Lymphocytes Relative: 17 %
Lymphs Abs: 1.8 10*3/uL (ref 0.7–4.0)
MCH: 28.8 pg (ref 26.0–34.0)
MCHC: 33.7 g/dL (ref 30.0–36.0)
MCV: 85.4 fL (ref 80.0–100.0)
Monocytes Absolute: 1.3 10*3/uL — ABNORMAL HIGH (ref 0.1–1.0)
Monocytes Relative: 12 %
Neutro Abs: 7.5 10*3/uL (ref 1.7–7.7)
Neutrophils Relative %: 68 %
Platelets: 249 10*3/uL (ref 150–400)
RBC: 3.23 MIL/uL — ABNORMAL LOW (ref 4.22–5.81)
RDW: 17 % — ABNORMAL HIGH (ref 11.5–15.5)
WBC: 10.9 10*3/uL — ABNORMAL HIGH (ref 4.0–10.5)
nRBC: 0 % (ref 0.0–0.2)

## 2023-01-18 LAB — TROPONIN I (HIGH SENSITIVITY)
Troponin I (High Sensitivity): 382 ng/L (ref <18)
Troponin I (High Sensitivity): 373 ng/L (ref <18)

## 2023-01-18 LAB — CBC
HCT: 27.2 % — ABNORMAL LOW (ref 39.0–52.0)
Hemoglobin: 8.8 g/dL — ABNORMAL LOW (ref 13.0–17.0)
MCH: 28.3 pg (ref 26.0–34.0)
MCHC: 32.4 g/dL (ref 30.0–36.0)
MCV: 87.5 fL (ref 80.0–100.0)
Platelets: 201 10*3/uL (ref 150–400)
RBC: 3.11 MIL/uL — ABNORMAL LOW (ref 4.22–5.81)
RDW: 17.2 % — ABNORMAL HIGH (ref 11.5–15.5)
WBC: 11 10*3/uL — ABNORMAL HIGH (ref 4.0–10.5)
nRBC: 0 % (ref 0.0–0.2)

## 2023-01-18 LAB — COMPREHENSIVE METABOLIC PANEL
ALT: 28 U/L (ref 0–44)
AST: 90 U/L — ABNORMAL HIGH (ref 15–41)
Albumin: 2.6 g/dL — ABNORMAL LOW (ref 3.5–5.0)
Alkaline Phosphatase: 222 U/L — ABNORMAL HIGH (ref 38–126)
Anion gap: 7 (ref 5–15)
BUN: 12 mg/dL (ref 6–20)
CO2: 22 mmol/L (ref 22–32)
Calcium: 7.1 mg/dL — ABNORMAL LOW (ref 8.9–10.3)
Chloride: 100 mmol/L (ref 98–111)
Creatinine, Ser: 0.92 mg/dL (ref 0.61–1.24)
GFR, Estimated: >60 mL/min (ref >60)
Glucose, Bld: 217 mg/dL — ABNORMAL HIGH (ref 70–99)
Potassium: 3.9 mmol/L (ref 3.5–5.1)
Sodium: 129 mmol/L — ABNORMAL LOW (ref 135–145)
Total Bilirubin: 3.1 mg/dL — ABNORMAL HIGH (ref 0.3–1.2)
Total Protein: 6.7 g/dL (ref 6.5–8.1)

## 2023-01-18 LAB — MAGNESIUM: Magnesium: 1.9 mg/dL (ref 1.7–2.4)

## 2023-01-18 LAB — BPAM FFP
Blood Product Expiration Date: 202402022359
Blood Product Expiration Date: 202402022359
ISSUE DATE / TIME: 202402021557
ISSUE DATE / TIME: 202402021805
Unit Type and Rh: 6200
Unit Type and Rh: 6200

## 2023-01-18 LAB — URINE DRUG SCREEN, QUALITATIVE (ARMC ONLY)
Amphetamines, Ur Screen: NOT DETECTED
Barbiturates, Ur Screen: NOT DETECTED
Benzodiazepine, Ur Scrn: POSITIVE — AB
Cannabinoid 50 Ng, Ur ~~LOC~~: NOT DETECTED
Cocaine Metabolite,Ur ~~LOC~~: NOT DETECTED
MDMA (Ecstasy)Ur Screen: NOT DETECTED
Methadone Scn, Ur: NOT DETECTED
Opiate, Ur Screen: POSITIVE — AB
Phencyclidine (PCP) Ur S: NOT DETECTED
Tricyclic, Ur Screen: NOT DETECTED

## 2023-01-18 LAB — BRAIN NATRIURETIC PEPTIDE: B Natriuretic Peptide: 1554.2 pg/mL — ABNORMAL HIGH (ref 0.0–100.0)

## 2023-01-18 LAB — PROTIME-INR
INR: 1.3 — ABNORMAL HIGH (ref 0.8–1.2)
Prothrombin Time: 16.1 seconds — ABNORMAL HIGH (ref 11.4–15.2)

## 2023-01-18 LAB — LIPOPROTEIN A (LPA): Lipoprotein (a): <8.4 nmol/L (ref <75.0)

## 2023-01-18 LAB — PHOSPHORUS: Phosphorus: 1.9 mg/dL — ABNORMAL LOW (ref 2.5–4.6)

## 2023-01-18 SURGERY — ESOPHAGOGASTRODUODENOSCOPY (EGD) WITH PROPOFOL
Anesthesia: Monitor Anesthesia Care

## 2023-01-18 MED ORDER — MORPHINE SULFATE (PF) 2 MG/ML IV SOLN
INTRAVENOUS | Status: AC
  Administered 2023-01-18: 2 mg via INTRAVENOUS
  Filled 2023-01-18: qty 1

## 2023-01-18 MED ORDER — ALBUMIN HUMAN 25 % IV SOLN
12.5000 g | Freq: Once | INTRAVENOUS | Status: AC
  Administered 2023-01-18: 12.5 g via INTRAVENOUS
  Filled 2023-01-18: qty 50

## 2023-01-18 MED ORDER — FUROSEMIDE 10 MG/ML IJ SOLN
40.0000 mg | Freq: Once | INTRAMUSCULAR | Status: AC
  Administered 2023-01-18: 40 mg via INTRAVENOUS

## 2023-01-18 MED ORDER — FUROSEMIDE 10 MG/ML IJ SOLN
INTRAMUSCULAR | Status: AC
  Filled 2023-01-18: qty 4

## 2023-01-18 MED ORDER — MORPHINE SULFATE (PF) 2 MG/ML IV SOLN
2.0000 mg | INTRAVENOUS | Status: DC | PRN
  Administered 2023-01-18 – 2023-01-20 (×10): 2 mg via INTRAVENOUS
  Filled 2023-01-18 (×10): qty 1

## 2023-01-18 MED ORDER — MORPHINE SULFATE (PF) 2 MG/ML IV SOLN: 2.0000 mg | Freq: Once | INTRAVENOUS | Status: DC

## 2023-01-18 MED ORDER — POTASSIUM & SODIUM PHOSPHATES 280-160-250 MG PO PACK
2.0000 | PACK | ORAL | Status: AC
  Administered 2023-01-18 (×3): 2 via ORAL
  Filled 2023-01-18 (×3): qty 2

## 2023-01-18 MED ORDER — MORPHINE SULFATE (PF) 2 MG/ML IV SOLN: 2.0000 mg | INTRAVENOUS | Status: AC

## 2023-01-18 MED ORDER — FUROSEMIDE 10 MG/ML IJ SOLN
40.0000 mg | Freq: Once | INTRAMUSCULAR | Status: AC
  Administered 2023-01-18: 40 mg via INTRAVENOUS
  Filled 2023-01-18: qty 4

## 2023-01-18 MED ORDER — FLEET ENEMA 7-19 GM/118ML RE ENEM: 1.0000 | ENEMA | Freq: Once | RECTAL | Status: DC

## 2023-01-18 NOTE — Consult Note (Signed)
PHARMACY CONSULT NOTE - ELECTROLYTES  Pharmacy Consult for Electrolyte Monitoring and Replacement   Recent Labs: Potassium (mmol/L)  Date Value  01/18/2023 3.9  09/06/2013 3.5   Magnesium (mg/dL)  Date Value  01/18/2023 1.9  03/09/2013 2.2   Calcium (mg/dL)  Date Value  01/18/2023 7.1 (L)   Calcium, Total (mg/dL)  Date Value  09/06/2013 8.9   Albumin (g/dL)  Date Value  01/18/2023 2.6 (L)  09/06/2013 4.0   Phosphorus (mg/dL)  Date Value  01/18/2023 1.9 (L)   Sodium (mmol/L)  Date Value  01/18/2023 129 (L)  09/06/2013 134 (L)   Corrected Ca: 8.2 mg/dL  Assessment  Ethan Wells is a 52 y.o. male presenting with hemorrhagic shock and acute on HFpEF. PMH significant for PSA (marijuana, cocaine), CAD (hx NSTEMI, STEMI), anemia 2/2 hemorrhoidal bleeding, AUD, mesentery ischemia s/p stent. Pharmacy has been consulted to monitor and replace electrolytes.  Diet: NPO Pertinent medications: octreotide gtt @ 50 mcg/hr, albumin 12.5g IV x1  Goal of Therapy: Electrolytes WNL  Plan:  Potassium: 4.2 >> 3.9, no replacement needed Magnesium: 1.7 >> 1.9, no replacement needed Phosphorus: 2.5 >> 1.9, give PhosNAK 2 packets PO Q4H x3 Check BMP, Phos with AM labs  Thank you for allowing pharmacy to be a part of this patient's care.  Gretel Acre, PharmD PGY1 Pharmacy Resident 01/18/2023 8:01 AM

## 2023-01-18 NOTE — Progress Notes (Signed)
[facility] Progress Note  [name]    JKD:[id number]  DOA: 01/16/2023    Date of Service: the patient was seen and examined on 01/18/2023  Brief hospital course: 52 year old male with past medical history of morbid obesity, CAD, polysubstance abuse, mesenteric ischemia status post stent and history of recent hemorrhoidal bleeding status post hemorrhoidectomy which was complicated by recurrent bleeding requiring repeat visit to the OR and non-STEMI.  At that time, patient was transfused 1 unit packed red blood cell transfusion.  Patient presented back to the emergency room on the night of 2/1 with complaints of lower extremity swelling and dyspnea on exertion.  Patient found to have acute kidney injury, hypotension with systolic blood pressure in the 80s tachypnea and hemoglobin of 6.3.  Vascular imaging noted active rectal bleeding and patient admitted for hemorrhagic shock with secondary blood loss anemia.  Also felt to have high-output heart failure.  Transfused 2 units packed red blood cells plus blood on IV fluids.  Patient also had elevated troponins which peaked as high as the 400s.  General surgery consulted.   Assessment and Plan: * Hemorrhagic shock (HCC)-resolved as of 01/18/2023 Secondary to acute on chronic blood loss anemia/symptomatic anemia Hematochezia, suspect secondary to hemorrhoidal bleeding History of hemorrhoidectomy November 2023 Patient has a history of bleeding hemorrhoids requiring hemorrhoidectomy in November 0998 complicated by rebleed and now reports bleeding with each BM CTA showing "Findings suspicious for small active bleed in the rectum".  Treat with IV fluids and blood transfusions.  General surgery and GI following.  Manage conservatively and hemoglobin for now appears to have stabilized.  Still requiring some pressor support while diuresing    Acute blood loss anemia Secondary to hemorrhoidal bleeding.  An ongoing problem.  General  surgery recommending no surgical intervention at this time.  Gastroenterology consulted recommending blood transfusion keeping hemoglobin above 7-8.  Avoid NSAIDs.  Plan is for EGD/colonoscopy patient better stabilized  Acute on chronic heart failure with preserved ejection fraction (HFpEF) ([place]) Suspect high-output heart failure from ongoing blood loss.,  Also with drug use.  Echocardiogram results noted ejection fraction 50 to 55% with indeterminate diastolic function.  Diuresing as blood pressure allows.  Still net positive although has diuresed 3 L since hospitalization.  Demand ischemia Appreciate cardiology help.  Elevated troponins felt to be less likely due to acute non-STEMI more likely due to demand mismatch from blood loss anemia.  No beta-blocker for cocaine use.  Aspirin on hold due to anemia.  Continue to monitor.  Acute kidney injury (HCC)-resolved as of 01/18/2023 Secondary to blood loss anemia.  Improving with IV fluids and blood transfusion  Essential hypertension Blood pressure medications on hold due to hypotension from blood loss  Cocaine dependence with cocaine-induced disorder ([place]) Will counsel.  Unable to do procedures until cocaine has cleared his system.  Alcohol use disorder CIWA withdrawal protocol  Tobacco abuse Will counsel to quit  Morbid obesity ([place]) Meets criteria with BMI greater than 35 and comorbidities of CAD, CHF  Mesenteric artery stenosis (HCC) S/p stent 10/2022  Cocaine abuse ([place]) UDS positive for cocaine       Body mass index is 37.31 kg/m.        Consultants: General surgery Gastroenterology Critical care Cardiology  Procedures: Echocardiogram noting ejection fraction on low end of normal at 50 to 55%, indeterminate diastolic function Status post multiple units packed red blood cells  Antimicrobials: None  Code Status: Full code   Subjective: Patient complains of  being tired, mild headache  Objective:  Vitals:    01/18/23 1515 01/18/23 1530  BP: (!) 94/52 (!) 97/58  Pulse: 85 88  Resp: 20 19  Temp:    SpO2: 95% 94%    Intake/Output Summary (Last 24 hours) at 01/18/2023 1552 Last data filed at 01/18/2023 1444 Gross per 24 hour  Intake 2864.5 ml  Output 2550 ml  Net 314.5 ml    Filed Weights   01/16/23 2305 01/17/23 0430  Weight: 91.2 kg 101.7 kg   Body mass index is 37.31 kg/m.  Exam:  General: Fatigued, no acute distress HEENT: Normocephalic, atraumatic, mucous membranes slightly dry Cardiovascular: Irregular rhythm, rate controlled Respiratory: Decreased breath sounds throughout secondary to body habitus Abdomen: Soft, obese, nontender, positive bowel sounds Musculoskeletal: No clubbing or cyanosis, trace pitting edema Skin: No skin breaks, tears or lesions Psychiatry: Appropriate, but groggy Neurology: No focal deficits  Data Reviewed: Sodium of 129, phosphorus 1.9, BNP of 1500.  Troponin at 370  Disposition:  Status is: Inpatient Remains inpatient appropriate because:  -Stabilization of blood pressure -Stabilization of hemoglobin -Full diuresis    Anticipated discharge date: 2/7  Family Communication: Will call family DVT Prophylaxis: SCDs Start: 01/17/23 0414    Author: Annita Brod ,MD 01/18/2023 3:52 PM  To reach On-call, see care teams to locate the attending and reach out via [email or web address]. Between 7PM-7AM, please contact night-coverage If you still have difficulty reaching the attending provider, please page the [facility] (Director on Call) for [facility] on amion for assistance.

## 2023-01-18 NOTE — Care Plan (Deleted)
Note entered in error

## 2023-01-18 NOTE — Progress Notes (Deleted)
Report called to Jessica RN

## 2023-01-18 NOTE — Progress Notes (Signed)
Patient complained of sudden onset shortness of breath associated with chest and abdominal pain stating "feels similar to my last heart attack".  On bedside assessment, patient was in respiratory distress and working hard to breath. Lungs sound wet and with crackles to auscultation.  Plan -STAT chest xray and KUB ordered -IV Lasix 40 mg x 1 given -Morphine 2 mg IV administered -Placed BiPAP -STAT EKG obtained -Labs sent troponin, Lactate and BMP+ MAG  Patient c/o nausea and vomited while on BiPAP. BiPAP removed and patient placed on high flow oxygen. Shortly after receiving Morphine and Lasix, patient reported improvement.  Pertinent Labs /Diagnostics Reviewed Chest xray > no active cardiopulmonary process KUB>Nonspecific loop of small bowel within the right lower abdomen appears distended with gas up to 3 cm. Otherwise nonobstructive bowel gas pattern.  EKG: normal EKG, normal sinus rhythm, unchanged from previous tracings. No dynamic changes or abnormal ST changes  Troponin elevated but trending down from previous findings.    Rufina Falco, DNP, CCRN, FNP-C, AGACNP-BC Acute Care & Family Nurse Practitioner  [facility]  See Amion for personal pager PCCM on call pager [phone] until 7 am

## 2023-01-18 NOTE — Progress Notes (Signed)
Rounding Note    Patient Name: Ethan Wells Date of Encounter: 01/18/2023  [place] Cardiologist: Lauree Chandler, MD   Subjective   Reviewed overnight episode of pain. He noted that he was coughing a lot which made him feel nauseated, and he then had some emesis with abdominal pain that radiated to his chest. hsTn remained downtrending. This AM, he notes diffuse bilateral leg pain that is sensitive to even light touch. Notes that he wants something to drink as he was told his GI procedure would be pushed back until 2/5. Asking for pain medication.  Inpatient Medications    Scheduled Meds:  sodium chloride   Intravenous Once   sodium chloride   Intravenous Once   sodium chloride   Intravenous Once   sodium chloride   Intravenous Once   Chlorhexidine Gluconate Cloth  6 each Topical Daily   docusate sodium  100 mg Oral BID   folic acid  1 mg Oral Daily   furosemide  40 mg Intravenous Once   midodrine  2.5 mg Oral TID WC   multivitamin with minerals  1 tablet Oral Daily   nicotine  14 mg Transdermal Daily   pantoprazole (PROTONIX) IV  40 mg Intravenous Q12H   polyethylene glycol  17 g Oral Daily   potassium & sodium phosphates  2 packet Oral Q4H   sodium chloride flush  10-40 mL Intracatheter Q12H   thiamine (VITAMIN B1) injection  100 mg Intravenous Daily   Or   thiamine  100 mg Oral Daily   Continuous Infusions:  sodium chloride 250 mL (01/18/23 0948)   cefTRIAXone (ROCEPHIN)  IV Stopped (85/46/27 0350)   folic acid 1 mg in sodium chloride 0.9 % 50 mL IVPB     norepinephrine (LEVOPHED) Adult infusion 5 mcg/min (01/18/23 0700)   octreotide (SANDOSTATIN) 500 mcg in sodium chloride 0.9 % 250 mL (2 mcg/mL) infusion 50 mcg/hr (01/18/23 0908)   PRN Meds: acetaminophen, fentaNYL (SUBLIMAZE) injection, LORazepam, sodium chloride flush   Vital Signs    Vitals:   01/18/23 0900 01/18/23 0902 01/18/23 0909 01/18/23 0930  BP: (!) 88/52 (!) 85/51 (!) 95/55  (!) 101/59  Pulse: 95 94 95 94  Resp: (!) 21 (!) '21 18 20  '$ Temp:      TempSrc:      SpO2: 96% 96% 95% 95%  Weight:      Height:        Intake/Output Summary (Last 24 hours) at 01/18/2023 1132 Last data filed at 01/18/2023 0700 Gross per 24 hour  Intake 3104.02 ml  Output 2800 ml  Net 304.02 ml      01/17/2023    4:30 AM 01/16/2023   11:05 PM 01/06/2023   10:48 AM  Last 3 Weights  Weight (lbs) 224 lb 3.3 oz 201 lb 1 oz 200 lb  Weight (kg) 101.7 kg 91.2 kg 90.719 kg      Telemetry    NSR - Personally Reviewed  ECG    NSR at 98 bpm - Personally Reviewed  Physical Exam   GEN: Well nourished, well developed in no acute distress NECK: No JVD appreciated CARDIAC: regular rhythm, normal S1 and S2, no rubs or gallops. No murmur. VASCULAR: Radial pulses 2+ bilaterally.  RESPIRATORY:  Clear to auscultation except for coarseness at bilateral bases ABDOMEN: Soft, non-tender, non-distended MUSCULOSKELETAL:  Moves all 4 limbs independently SKIN: Warm and dry, diffuse edema throughout bilateral legs to lower abdomen NEUROLOGIC:  No focal neuro deficits noted.  PSYCHIATRIC:  Normal affect    Labs    High Sensitivity Troponin:   Recent Labs  Lab 01/16/23 2347 01/17/23 0126 01/18/23 0204 01/18/23 0445  TROPONINIHS 431* 436* 382* 373*     Chemistry Recent Labs  Lab 01/16/23 2347 01/17/23 0126 01/17/23 1142 01/17/23 2015 01/18/23 0204  NA 128*  --  128*  --  129*  K 2.8*  --  2.9* 4.2 3.9  CL 94*  --  96*  --  100  CO2 20*  --  23  --  22  GLUCOSE 109*  --  172*  --  217*  BUN 20  --  18  --  12  CREATININE 1.45*  --  1.30*  --  0.92  CALCIUM 7.6*  --  7.2*  --  7.1*  MG  --  1.7  --   --  1.9  PROT 6.8  --   --   --  6.7  ALBUMIN 2.6*  --   --   --  2.6*  AST 99*  --   --   --  90*  ALT 24  --   --   --  28  ALKPHOS 271*  --   --   --  222*  BILITOT 1.5*  --   --   --  3.1*  GFRNONAA 59*  --  >60  --  >60  ANIONGAP 14  --  9  --  7    Lipids No results for  input(s): "CHOL", "TRIG", "HDL", "LABVLDL", "LDLCALC", "CHOLHDL" in the last 168 hours.  Hematology Recent Labs  Lab 01/16/23 2347 01/17/23 1127 01/17/23 2015 01/18/23 0204  WBC 9.0  --  8.8 10.9*  RBC 2.26*  --  2.63* 3.23*  HGB 6.3* 6.0* 7.5* 9.3*  HCT 20.0* 18.3* 22.9* 27.6*  MCV 88.5  --  87.1 85.4  MCH 27.9  --  28.5 28.8  MCHC 31.5  --  32.8 33.7  RDW 18.5*  --  16.8* 17.0*  PLT 213  --  206 249   Thyroid No results for input(s): "TSH", "FREET4" in the last 168 hours.  BNP Recent Labs  Lab 01/16/23 2347 01/18/23 0204  BNP 1,052.4* 1,554.2*    DDimer  Recent Labs  Lab 01/17/23 1142  DDIMER 2.57*     Radiology    DG Abd 1 View  Result Date: 01/18/2023 CLINICAL DATA:  [id number] Abdominal pain [id number] EXAM: ABDOMEN - 1 VIEW COMPARISON:  CT abdomen pelvis 01/17/2023 FINDINGS: A loop of small bowel within the right lower abdomen appears distended with gas up to 3 cm. No radio-opaque calculi or other significant radiographic abnormality are seen. IMPRESSION: 1. Nonspecific loop of small bowel within the right lower abdomen appears distended with gas up to 3 cm. 2. Otherwise nonobstructive bowel gas pattern. Electronically Signed   By: Iven Finn M.D.   On: 01/18/2023 02:11   DG Chest 1 View  Result Date: 01/18/2023 CLINICAL DATA:  [id number] SOB (shortness of breath) [id number] EXAM: CHEST  1 VIEW COMPARISON:  Chest x-ray 01/16/2023. FINDINGS: Interval placement of a right PICC with tip overlying the expected region of the superior cavoatrial junction. The heart and mediastinal contours are unchanged No focal consolidation. Chronic coarsened interstitial markings with no overt pulmonary edema. No pleural effusion. No pneumothorax. No acute osseous abnormality. IMPRESSION: 1. No active disease. 2. Interval placement of a right PICC with tip overlying the expected region of the superior cavoatrial junction. Electronically Signed  By: Iven Finn M.D.   On: 01/18/2023 02:03   [place]  EKG SITE RITE  Result Date: 01/17/2023 If [facility] image not attached, placement could not be confirmed due to current cardiac rhythm.  ECHOCARDIOGRAM COMPLETE  Result Date: 01/17/2023    ECHOCARDIOGRAM REPORT   Patient Name:   Ethan Wells Date of Exam: 01/17/2023 Medical Rec #:  [id number]           Height:       65.0 in Accession #:    [id number]          Weight:       224.2 lb Date of Birth:  07/14/1971           BSA:          2.077 m Patient Age:    23 years            BP:           106/69 mmHg Patient Gender: M                   HR:           92 bpm. Exam Location:  [facility] Procedure: 2D Echo, Cardiac Doppler and Color Doppler Indications:     NSTEMI I21.4  History:         Patient has prior history of Echocardiogram examinations, most                  recent 10/30/2022. CAD; Risk Factors:Hypertension. Ischemic                  cardiomyopathy, tobacco abuse.  Sonographer:     Sherrie Sport Referring Phys:  [id number] Athena Masse Diagnosing Phys: Ida Rogue MD  Sonographer Comments: Technically challenging study due to limited acoustic windows, no apical window and no subcostal window. Image acquisition challenging due to patient body habitus. IMPRESSIONS  1. Left ventricular ejection fraction, by estimation, is 50 to 55%. The left ventricle has low normal function. The left ventricle has no regional wall motion abnormalities. Left ventricular diastolic parameters are indeterminate.  2. Right ventricular systolic function is normal. The right ventricular size is normal.  3. The mitral valve is normal in structure. Mild mitral valve regurgitation. No evidence of mitral stenosis.  4. The aortic valve is normal in structure. Aortic valve regurgitation is not visualized. No aortic stenosis is present.  5. The inferior vena cava is normal in size with greater than 50% respiratory variability, suggesting right atrial pressure of 3 mmHg. FINDINGS  Left Ventricle: Left ventricular ejection fraction, by  estimation, is 50 to 55%. The left ventricle has low normal function. The left ventricle has no regional wall motion abnormalities. The left ventricular internal cavity size was normal in size. There is no left ventricular hypertrophy. Left ventricular diastolic parameters are indeterminate. Right Ventricle: The right ventricular size is normal. No increase in right ventricular wall thickness. Right ventricular systolic function is normal. Left Atrium: Left atrial size was normal in size. Right Atrium: Right atrial size was normal in size. Pericardium: There is no evidence of pericardial effusion. Mitral Valve: The mitral valve is normal in structure. Mild mitral valve regurgitation. No evidence of mitral valve stenosis. Tricuspid Valve: The tricuspid valve is normal in structure. Tricuspid valve regurgitation is not demonstrated. No evidence of tricuspid stenosis. Aortic Valve: The aortic valve is normal in structure. Aortic valve regurgitation is not visualized. No aortic stenosis is present. Pulmonic Valve: The  pulmonic valve was normal in structure. Pulmonic valve regurgitation is not visualized. No evidence of pulmonic stenosis. Aorta: The aortic root is normal in size and structure. Venous: The inferior vena cava is normal in size with greater than 50% respiratory variability, suggesting right atrial pressure of 3 mmHg. IAS/Shunts: No atrial level shunt detected by color flow Doppler.  LEFT VENTRICLE PLAX 2D LVIDd:         5.60 cm LVIDs:         4.35 cm LV PW:         0.95 cm LV IVS:        0.80 cm LVOT diam:     2.10 cm LVOT Area:     3.46 cm  LEFT ATRIUM         Index LA diam:    3.60 cm 1.73 cm/m   AORTA Ao Root diam: 3.00 cm  SHUNTS Systemic Diam: 2.10 cm Ida Rogue MD Electronically signed by Ida Rogue MD Signature Date/Time: 01/17/2023/3:25:48 PM    Final    US Venous Img Lower Bilateral (DVT)  Result Date: 01/17/2023 CLINICAL DATA:  CHF EXAM: BILATERAL LOWER EXTREMITY VENOUS DOPPLER  ULTRASOUND TECHNIQUE: Gray-scale sonography with compression, as well as color and duplex ultrasound, were performed to evaluate the deep venous system(s) from the level of the common femoral vein through the popliteal and proximal calf veins. COMPARISON:  None Available. FINDINGS: VENOUS Normal compressibility of the common femoral, superficial femoral, and popliteal veins, as well as the visualized calf veins. Visualized portions of profunda femoral vein and great saphenous vein unremarkable. No filling defects to suggest DVT on grayscale or color Doppler imaging. Doppler waveforms show normal direction of venous flow, normal respiratory plasticity and response to augmentation. Subcutaneous edema in the bilateral lower legs. IMPRESSION: Negative for DVT in the lower extremities. Electronically Signed   By: Jorje Guild M.D.   On: 01/17/2023 06:56   CT ANGIO GI BLEED  Result Date: 01/17/2023 CLINICAL DATA:  Acute blood loss anemia with rectal bleeding EXAM: CTA ABDOMEN AND PELVIS WITHOUT AND WITH CONTRAST TECHNIQUE: Multidetector CT imaging of the abdomen and pelvis was performed using the standard protocol during bolus administration of intravenous contrast. Multiplanar reconstructed images and MIPs were obtained and reviewed to evaluate the vascular anatomy. RADIATION DOSE REDUCTION: This exam was performed according to the departmental dose-optimization program which includes automated exposure control, adjustment of the mA and/or kV according to patient size and/or use of iterative reconstruction technique. CONTRAST:  130m OMNIPAQUE IOHEXOL 350 MG/ML SOLN COMPARISON:  None Available. FINDINGS: VASCULAR Aorta: Normal caliber aorta without aneurysm, dissection, vasculitis or significant stenosis. Scattered calcified atherosclerotic plaque. Celiac: Severe focal stenosis at the origin of the celiac artery similar to 09/30/2022. SMA: Interval placement of a stent at the origin of the SMA. The stent is patent.  Renals: Both renal arteries are patent without evidence of aneurysm, dissection, vasculitis, fibromuscular dysplasia or significant stenosis. IMA: Patent without evidence of aneurysm, dissection, vasculitis or significant stenosis. Inflow: Patent without evidence of aneurysm, dissection, vasculitis or significant stenosis. Proximal Outflow: Bilateral common femoral and visualized portions of the superficial and profunda femoral arteries are patent without evidence of aneurysm, dissection, vasculitis or significant stenosis. Veins: Patent portal vein and IVC. Review of the MIP images confirms the above findings. NON-VASCULAR Lower chest: No acute abnormality. Hepatobiliary: Cirrhosis. Nondistended gallbladder. Gallbladder wall thickening and pericholecystic fluid favored related to cirrhosis/volume status. No biliary dilation. Pancreas: Unremarkable. Spleen: The spleen measures 14.5 cm in craniocaudal  dimension. There is at the upper limits of normal in size. Adrenals/Urinary Tract: Adrenal glands are unremarkable. Kidneys are normal, without renal calculi, focal lesion, or hydronephrosis. Bladder is unremarkable. Stomach/Bowel: There is a focal hyperdensity on arterial phase images (series 11/image 1041) which diffuse is on portal venous phase and is not present on noncontrast images. This is compatible with small focus of active bleed. Hyperdense stool is present throughout the colon on noncontrast images and limits assessment for additional active bleeding. Normal caliber large and small bowel. Wall thickening about the sigmoid colon and rectum. Additional wall thickening versus underdistention of the ascending colon. Wall thickening and adjacent stranding and fluid about the duodenum. Lymphatic: No suspicious lymphadenopathy. Reproductive: Unremarkable. Other: Small amount of free fluid in the pelvis. Central mesenteric edema. No free intraperitoneal air. Musculoskeletal: No acute osseous abnormality. IMPRESSION:  VASCULAR Findings suspicious for small active bleed in the rectum. Severe stenosis at the origin of the celiac axis is redemonstrated. Patent stent at the origin of the SMA. NON-VASCULAR Wall thickening about the sigmoid colon/rectum as well as the transverse colon favored due to colitis however congestive colopathy could appear similarly. Duodenal wall thickening suggesting duodenitis. Cirrhosis and borderline splenomegaly compatible with portal hypertension. These results were called by telephone at the time of interpretation on 01/17/2023 at 3:19 am to provider [facility] , who verbally acknowledged these results. Electronically Signed   By: Placido Sou M.D.   On: 01/17/2023 03:25   DG Chest Portable 1 View  Result Date: 01/16/2023 CLINICAL DATA:  Volume overload EXAM: PORTABLE CHEST 1 VIEW COMPARISON:  12/12/2022 FINDINGS: Cardiac shadow is enlarged but stable. No significant vascular congestion is noted. No edema is seen. The lungs are clear. No bony abnormality is noted. IMPRESSION: No active disease. Electronically Signed   By: Inez Catalina M.D.   On: 01/16/2023 23:42    Cardiac Studies   Echo 01/17/23 1. Left ventricular ejection fraction, by estimation, is 50 to 55%. The  left ventricle has low normal function. The left ventricle has no regional  wall motion abnormalities. Left ventricular diastolic parameters are  indeterminate.   2. Right ventricular systolic function is normal. The right ventricular  size is normal.   3. The mitral valve is normal in structure. Mild mitral valve  regurgitation. No evidence of mitral stenosis.   4. The aortic valve is normal in structure. Aortic valve regurgitation is  not visualized. No aortic stenosis is present.   5. The inferior vena cava is normal in size with greater than 50%  respiratory variability, suggesting right atrial pressure of 3 mmHg.   Patient Profile     52 y.o. male with PMH STEMI with VF arrest 2012, CAD with known CTO Lcx/RCA,  hypertension, ischemic cardiomyopathy, PAD with SMA stenting, recent admission for hemorrhoidal bleeding whom we are consulted on for shortness of breath and LE edema. Hospitalization notable for Hgb 6.3, +cocaine, K 2.8, hypotension on presentation.  Assessment & Plan    Elevated troponin GI bleeding Acute blood loss anemia Hypotension due to blood loss Cocaine abuse Tobacco abuse -in the setting of known CAD with CTO Lcx and RCA, as well as continued GI bleeding and blood loss anemia with Hgb 6.3 on presentation -given his hypotension and anemia, in the setting of CAD, suspect demand ischemia -+cocaine on admission also likely adds to demand -not a candidate for cath currently given anemia/bleeding -agree with supportive care -management of BP/pressors per primary team. Now on midodrine and 4 mcg/min  levophed, being weaned down -given cocaine use, avoid beta blockers -aspirin on hold given anemia and continued bleeding -statin on hold given elevated LFTs. Imaging concerning for cirrhosis -EF low normal on echo  Diffuse edema -echo with low normal LVEF and RAP of 3. This supports non-cardiac etiology of edema. Suspect 3rd spacing due to hypoalbuminemia of 2.6 and concern for cirrhosis on [place] will sign off.  Please contact us with questions or concerns Medication Recommendations:  as above, no beta blocker given cocaine use, no aspirin given acute anemia and continued bleeding, no statin given abnormal LFTs and concern for cirrhosis.  Other recommendations (labs, testing, etc):  none Follow up as an outpatient:  Unfortunately he has not been seen outpatient recently due to recurrent admissions in October, November, December 2023 and now February 2024. He lives in [place], will arrange for outpatient follow up with [facility] cardiology post hospitalization.  For questions or updates, please contact [place] Please consult [email or web address] for  contact info under        Signed, Buford Dresser, MD  01/18/2023, 11:32 AM

## 2023-01-18 NOTE — Progress Notes (Signed)
NAME:  Ethan Wells, MRN:  [id number], DOB:  October 30, 1971, LOS: 1 ADMISSION DATE:  01/16/2023,  CHIEF COMPLAINT:  GI Bleed   History of Present Illness:  52 year old male presenting to [facility] ED from home on 01/17/2023 for evaluation of increasing bilateral lower extremity swelling.  Patient reports several days of worsening bilateral lower extremity edema and spite of compliance with his medications.  However when asked specifically about his Lasix he stated he started taking that more recently within the last 5 days, and then said he " hates taking pills".  He endorses fatigue and dyspnea with exertion.  He also reports chronic rectal bleeding that has been ongoing even after his hemorrhoidectomy in November 2023.  This bleeding is described as red streaks of blood in his stool with each bowel movement.  He also reported 1 episode of coffee-ground emesis and melena a couple months ago, but this is not repeated.  He denies outright fever/chills, but states he gets cold a lot.  He denies nausea/vomiting/diarrhea or dysuria.  He does report some chronic abdominal discomfort that has been ongoing since November. Patient reports he has been cutting down on his ETOH intake to 22 oz x 2 daily, and 2/1 he only consumed to 6 oz wine coolers.   ED course: Upon arrival patient A&O x 4 with tachypnea, mild tachycardia and soft but stable BP. Lab work revealed electrolyte derangements, acute anemia, transaminitis, elevated but flat troponin, elevated BNP & lactic acidosis without leukocytosis. CXR clear, CT angio suggestive of small active rectal bleed.  ICU evaluated for admission but at that time vital signs were stable and blood administration was imminent so TRH was consulted for admission.  During blood transfusion patient's blood pressure became persistently hypotensive. PCCM consulted for vasopressor administration.  CXR 01/16/23: no active disease CT angio GI bleed 01/17/23: Findings suspicious for small  active bleed in rectum.  Severe stenosis at the origin of the celiac axis is re-demonstrated, patent stent at the origin of the SMA.  Wall thickening about the sigmoid colon/rectum as well as the transverse colon favored due to colitis however congestive colopathy could appear similarly/duodenal wall thickening suggesting duodenitis/cirrhosis and borderline splenomegaly compatible with portal hypertension.   PCCM consulted for assistance in management and monitoring due to circulatory shock s/t ABLA from small active rectal bleed.  Pertinent  Medical History  CAD s/p PCI (2012) V-fib arrest (2012) Cocaine & Marijuana abuse ETOH abuse (6-12 beers daily) Chronic hemorrhoid bleeding s/p hemorrhoidectomy Mesenteric Ischemia s/p stent placement HLD HTN ICM HFpEF Tobacco abuse (13 pack year history) Depression & anxiety  Significant Hospital Events: Including procedures, antibiotic start and stop dates in addition to other pertinent events   01/17/23: Admit with circulatory shock due to active rectal bleed. 4 units of PRBC, 2 of FFP, and 1 of platelets given; pressor requirements stable. Overnight into 2/3 did develop shortness of breath with increased O2 requirements, briefly on BiPAP, diuresed with improvement 01/18/23: diuresed overnight, on nasal cannula this AM.   Interim History / Subjective:  Continues to report generalized aches and pains. Short of breath, improved with oxygen supplementation.  Objective   Blood pressure 117/74, pulse 100, temperature (!) 97.4 F (36.3 C), temperature source Oral, resp. rate (!) 23, height '5\' 5"'$  (1.651 m), weight 101.7 kg, SpO2 95 %.    FiO2 (%):  [35 %] 35 %   Intake/Output Summary (Last 24 hours) at 01/18/2023 0733 Last data filed at 01/18/2023 0500 Gross per 24 hour  Intake 3459.21 ml  Output 2800 ml  Net 659.21 ml   Filed Weights   01/16/23 2305 01/17/23 0430  Weight: 91.2 kg 101.7 kg    Examination:  General: no acute distress, resting  in bed HEENT: [place]/AT, moist mucous membranes, sclera anicteric Neuro: A&O x 3, moving all extremities CV: rrr, s1s2, no murmurs PULM: clear to auscultation bilaterally anteriorly, bibasilar rales posteriorly. No wheezing GI: soft, non-tender, non-distended, BS+ Extremities: warm, +3 edema Skin: no rashes   Assessment & Plan:  52 year old male patient presented to the hospital with lower extremity swelling, found to have profound anemia and hemorrhagic shock secondary to lower GI bleed complicated by decompensated heart failure.  He is being volume resuscitated with blood products which is limited by his edema and concern for volume overload. He has signs of steatosis on liver ultrasound with thrombocytopenia and elevated LFT's concerning for portal hypertension/liver cirrhosis. He has adequate IV access with 4 peripheral IV's. Will continue with volume resuscitation with a 4:2:1 ratio, check fibrinogen level to assess for DIC, and administer Vitamin K. Will also initiate octreotide to assist with portal hypertension and start IV Ceftriaxone for SBP prophylaxis.  Neurology #Generalized Pain  Avoiding NSAIDS given GI Bleed and narcotics given history, judicious tylenol given liver disease. No encephalopathy on exam. History of alcohol use, on CIWA protocol with Lorazepam.  Cardiovascular #Acute Decompensated HFpEF #Circulatory Shock (hemorrhagic) #TACO  TTE yesterday with HFpEF, no drop in EF. Notable lower extremity edema and elevated BNP. This has worsened after receiving volume resuscitation with fluids, PRBC's, FFPs, and platelets (4:2:1). Initiated on gentle diuresis which we will escalate today, will re-administer furosemide today.  -furosemide 40 mg now -continue nor-epinephrine, goal MAP 65 -appreciate input from cardiology  Pulmonary #Acute Hypoxic Respiratory Failure  Secondary to pulmonary edema and volume overload in the setting of resuscitation, TACO. Will diurese as above.  Was briefly on BiPAP overnight and now back on room air.  -SpO2 92% -incentive spirometry -HOB elevation 30 degrees  Gastrointestinal #GI Bleed #Liver Cirrhosis  Presents with acute lower GI bleed with BRBPR and associated hemorrhagic shock requiring aggressive volume resuscitation and vasopressor support. Received Vitamin K, octreotide gtt given concern for cirrhosis. No sign of DIC on blood work. GI team consulted and following with plan for endoscopy after stabilization in respiratory and cardiac function.  -continue octreotide -continue PPI bid -continue SBP prophylaxis -GI following  Renal #Hyponatremia  Sodium level corrects to 131 this AM given hyperglycemia, previously 135 during prior admits. This is in the setting of chronic alcohol use, GI Bleed. Will monitor electrolytes closely and replete as necessary (hypophosphatemia this AM)  Endocrine  Glycemic control for hyperglycemia.  Hem/Onc  Holding DVT prophylaxis given GI bleed. duplex of the lower extremities given swelling ruled out VTE.  -monitor H/H closely  ID SBP prophylaxis with Ceftriaxone   Best Practice (right click and "Reselect all SmartList Selections" daily)   Diet/type: Regular consistency (see orders) DVT prophylaxis: not indicated GI prophylaxis: PPI Lines: N/A Foley:  N/A Code Status:  full code Last date of multidisciplinary goals of care discussion [01/18/2023]  Labs   CBC: Recent Labs  Lab 01/16/23 2347 01/17/23 1127 01/17/23 2015 01/18/23 0204  WBC 9.0  --  8.8 10.9*  NEUTROABS 6.1  --  6.7 7.5  HGB 6.3* 6.0* 7.5* 9.3*  HCT 20.0* 18.3* 22.9* 27.6*  MCV 88.5  --  87.1 85.4  PLT 213  --  206 249    Basic  Metabolic Panel: Recent Labs  Lab 01/16/23 2347 01/17/23 0126 01/17/23 1142 01/17/23 2015 01/18/23 0204  NA 128*  --  128*  --  129*  K 2.8*  --  2.9* 4.2 3.9  CL 94*  --  96*  --  100  CO2 20*  --  23  --  22  GLUCOSE 109*  --  172*  --  217*  BUN 20  --  18  --   12  CREATININE 1.45*  --  1.30*  --  0.92  CALCIUM 7.6*  --  7.2*  --  7.1*  MG  --  1.7  --   --  1.9  PHOS  --  2.5  --   --  1.9*   GFR: Estimated Creatinine Clearance: 105.4 mL/min (by C-G formula based on SCr of 0.92 mg/dL). Recent Labs  Lab 01/16/23 2347 01/17/23 0134 01/17/23 1148 01/17/23 2015 01/18/23 0204 01/18/23 0445  WBC 9.0  --   --  8.8 10.9*  --   LATICACIDVEN 5.1* 3.2* 1.6  --  1.4 0.9    Liver Function Tests: Recent Labs  Lab 01/16/23 2347 01/18/23 0204  AST 99* 90*  ALT 24 28  ALKPHOS 271* 222*  BILITOT 1.5* 3.1*  PROT 6.8 6.7  ALBUMIN 2.6* 2.6*   Recent Labs  Lab 01/17/23 0043  LIPASE 78*   No results for input(s): "AMMONIA" in the last 168 hours.  ABG    Component Value Date/Time   HCO3 25.9 01/17/2023 1142   O2SAT 48.2 01/17/2023 1142     Coagulation Profile: Recent Labs  Lab 01/17/23 0043 01/18/23 0204  INR 1.3* 1.3*    Cardiac Enzymes: Recent Labs  Lab 01/17/23 0126  CKTOTAL 161    HbA1C: Hgb A1c MFr Bld  Date/Time Value Ref Range Status  10/01/2022 01:37 AM 4.7 (L) 4.8 - 5.6 % Final    Comment:    (NOTE) Pre diabetes:          5.7%-6.4%  Diabetes:              >6.4%  Glycemic control for   <7.0% adults with diabetes   06/05/2020 02:03 AM 5.4 4.8 - 5.6 % Final    Comment:    (NOTE)         Prediabetes: 5.7 - 6.4         Diabetes: >6.4         Glycemic control for adults with diabetes: <7.0     CBG: Recent Labs  Lab 01/17/23 0422  GLUCAP 108*      Past Medical History:  He,  has a past medical history of CAD (coronary artery disease), Cardiac arrest - ventricular fibrillation, Chronic pain, Cocaine abuse ([place]), Depression with anxiety, ETOH abuse, GERD (gastroesophageal reflux disease), GI bleed, Hemorrhoids, Hyperlipidemia, Hypertension, Ischemic cardiomyopathy, Marijuana abuse, Migraines, and Tobacco abuse.   Surgical History:   Past Surgical History:  Procedure Laterality Date   COLONOSCOPY   08/09/2021   Procedure: COLONOSCOPY;  Surgeon: Lucilla Lame, MD;  Location: [facility] ENDOSCOPY;  Service: Endoscopy;;   CORONARY ANGIOPLASTY WITH STENT PLACEMENT     ESOPHAGOGASTRODUODENOSCOPY N/A 05/30/2022   Procedure: ESOPHAGOGASTRODUODENOSCOPY (EGD);  Surgeon: Lin Landsman, MD;  Location: [facility] ENDOSCOPY;  Service: Gastroenterology;  Laterality: N/A;   ESOPHAGOGASTRODUODENOSCOPY (EGD) WITH PROPOFOL N/A 03/01/2021   Procedure: ESOPHAGOGASTRODUODENOSCOPY (EGD) WITH PROPOFOL;  Surgeon: Lin Landsman, MD;  Location: [place];  Service: Endoscopy;  Laterality: N/A;   ESOPHAGOGASTRODUODENOSCOPY (EGD) WITH  PROPOFOL N/A 08/09/2021   Procedure: ESOPHAGOGASTRODUODENOSCOPY (EGD) WITH PROPOFOL;  Surgeon: Lucilla Lame, MD;  Location: [facility] ENDOSCOPY;  Service: Endoscopy;  Laterality: N/A;   EVALUATION UNDER ANESTHESIA WITH HEMORRHOIDECTOMY N/A 10/31/2022   Procedure: EXAM UNDER ANESTHESIA WITH SUTURE LIGATION OF TWO BLEEDING PEDICLES;  Surgeon: Olean Ree, MD;  Location: [facility] ORS;  Service: General;  Laterality: N/A;   FLEXIBLE SIGMOIDOSCOPY N/A 10/02/2022   Procedure: FLEXIBLE SIGMOIDOSCOPY;  Surgeon: Ladene Artist, MD;  Location: [place];  Service: Gastroenterology;  Laterality: N/A;   LEFT HEART CATH AND CORONARY ANGIOGRAPHY N/A 11/04/2022   Procedure: LEFT HEART CATH AND CORONARY ANGIOGRAPHY;  Surgeon: Wellington Hampshire, MD;  Location: [place] [facility];  Service: Cardiovascular;  Laterality: N/A;   VISCERAL ANGIOGRAPHY N/A 10/24/2022   Procedure: VISCERAL ANGIOGRAPHY;  Surgeon: Algernon Huxley, MD;  Location: [place] [facility];  Service: Cardiovascular;  Laterality: N/A;     Social History:   reports that he has been smoking cigarettes. He has a 20.50 pack-year smoking history. He has never used smokeless tobacco. He reports current alcohol use of about 12.0 standard drinks of alcohol per week. He reports that he does not currently use drugs after having used the following  drugs: Cocaine and Marijuana.   Family History:  His family history includes Alcohol abuse in his father; Coronary artery disease in his mother; Other in his paternal grandmother and another family member.   Allergies Allergies  Allergen Reactions   Penicillins Hives    Tolerated Ceftriaxone 10/2022   Other Rash    chives     Home Medications  Prior to Admission medications   Medication Sig Start Date End Date Taking? Authorizing Provider  aspirin 81 MG chewable tablet Chew 1 tablet (81 mg total) by mouth daily. 11/06/22 02/04/23 Yes Enzo Bi, MD  ezetimibe (ZETIA) 10 MG tablet Take 10 mg by mouth daily.   Yes [provider]  furosemide (LASIX) 40 MG tablet Take 1 tablet (40 mg total) by mouth daily. Patient taking differently: Take 40 mg by mouth 2 (two) times daily. 11/21/22 02/19/23 Yes Barton Dubois, MD  gabapentin (NEURONTIN) 300 MG capsule Take 1 capsule (300 mg total) by mouth 3 (three) times daily. 10/17/22  Yes Medina-Vargas, Monina C, NP  lisinopril (ZESTRIL) 5 MG tablet Take 5 mg by mouth daily.   Yes [provider]  methocarbamol (ROBAXIN) 750 MG tablet TAKE 1 TABLET (750 MG TOTAL) BY MOUTH DAILY AS NEEDED FOR MUSCLE SPASMS 12/23/22  Yes Medina-Vargas, Monina C, NP  pantoprazole (PROTONIX) 40 MG tablet TAKE 1 TABLET BY MOUTH EVERY DAY Patient taking differently: Take 40 mg by mouth 2 (two) times daily. 12/23/22  Yes Medina-Vargas, Monina C, NP  rosuvastatin (CRESTOR) 40 MG tablet TAKE 1 TABLET BY MOUTH EVERY DAY 12/19/22  Yes Medina-Vargas, Monina C, NP  acetaminophen (TYLENOL) 500 MG tablet Take 2 tablets (1,000 mg total) by mouth every 6 (six) hours as needed for mild pain. Patient not taking: Reported on 01/17/2023 10/28/22   Olean Ree, MD  calcium-vitamin D (OSCAL WITH D) 500-5 MG-MCG tablet Take 1 tablet by mouth 2 (two) times daily. Patient not taking: Reported on 01/17/2023 10/17/22   Medina-Vargas, Monina C, NP  ferrous sulfate 325 (65 FE) MG tablet Take 1  tablet (325 mg total) by mouth daily with breakfast. Patient not taking: Reported on 01/17/2023 11/06/22 02/04/23  Enzo Bi, MD  folic acid (FOLVITE) 1 MG tablet Take 1 tablet (1 mg total) by mouth daily.  Patient not taking: Reported on 01/17/2023 11/06/22   Enzo Bi, MD  isosorbide mononitrate (IMDUR) 30 MG 24 hr tablet Take 0.5 tablets (15 mg total) by mouth daily. Patient not taking: Reported on 01/17/2023 11/22/22   Barton Dubois, MD  lidocaine (XYLOCAINE) 5 % ointment Apply 1 Application topically 4 (four) times daily as needed. Apply to the perianal area Patient not taking: Reported on 01/17/2023 10/28/22   Olean Ree, MD  magnesium oxide (MAG-OX) 400 (240 Mg) MG tablet Take 1 tablet (400 mg total) by mouth 2 (two) times daily. Patient not taking: Reported on 01/17/2023 11/06/22   Enzo Bi, MD  metoprolol tartrate (LOPRESSOR) 25 MG tablet Take 12.5 mg by mouth 2 (two) times daily. Patient not taking: Reported on 01/17/2023    Enzo Bi, MD  midodrine (PROAMATINE) 2.5 MG tablet Take 1 tablet (2.5 mg total) by mouth 3 (three) times daily with meals. Patient not taking: Reported on 01/17/2023 11/24/22   Barton Dubois, MD  Multiple Vitamins-Minerals (MULTIVITAMIN) tablet Take 1 tablet by mouth daily. 11/06/22 02/04/23  Enzo Bi, MD  nicotine (NICODERM CQ - DOSED IN MG/24 HOURS) 14 mg/24hr patch Place 1 patch (14 mg total) onto the skin daily. Patient not taking: Reported on 01/17/2023 11/21/22   Barton Dubois, MD  nitroGLYCERIN (NITROSTAT) 0.4 MG SL tablet Place 1 tablet (0.4 mg total) under the tongue every 5 (five) minutes as needed for chest pain. 11/06/22   Enzo Bi, MD  polyethylene glycol (MIRALAX / GLYCOLAX) 17 g packet Take 17 g by mouth daily. Patient not taking: Reported on 01/17/2023 11/06/22 02/04/23  Enzo Bi, MD  psyllium (HYDROCIL/METAMUCIL) 95 % PACK Take 1 packet by mouth daily. Patient not taking: Reported on 01/17/2023 11/06/22 02/04/23  Enzo Bi, MD  thiamine (VITAMIN B-1) 100 MG  tablet Take 1 tablet (100 mg total) by mouth daily. Patient not taking: Reported on 01/17/2023 11/06/22   Enzo Bi, MD     Critical care time: 63 minutes     Armando Reichert, MD [facility] 01/18/2023 10:38 AM

## 2023-01-18 NOTE — Anesthesia Preprocedure Evaluation (Deleted)
Anesthesia Evaluation    Airway        Dental   Pulmonary Current Smoker and Patient abstained from smoking.          Cardiovascular hypertension,      Neuro/Psych    GI/Hepatic   Endo/Other    Renal/GU      Musculoskeletal   Abdominal   Peds  Hematology   Anesthesia Other Findings   Reproductive/Obstetrics                              Anesthesia Physical Anesthesia Plan Anesthesia Quick Evaluation

## 2023-01-18 NOTE — Progress Notes (Signed)
Inpatient Follow-up/Progress Note   Patient ID: Ethan Wells is a 52 y.o. male.  Overnight Events / Subjective Findings Pt received blood yesterday and developed difficulty breathing. He has been placed on nasal cannula (which he takes off frequently) and desaturates to 90%. Rectal bleeding seems to be slowing. Hgb improved from 6 to 9.3 with transfusion UDS negative today. Will hold off on initial plan for egd/flex sig due to respiratory issues. Continues to require levophed Pt c/o chronic pain sx.  Review of Systems  Constitutional:  Positive for activity change and appetite change. Negative for chills, diaphoresis, fatigue, fever and unexpected weight change.  HENT:  Negative for trouble swallowing and voice change.   Respiratory:  Positive for cough and shortness of breath. Negative for wheezing.   Cardiovascular:  Positive for leg swelling. Negative for chest pain and palpitations.  Gastrointestinal:  Positive for anal bleeding and blood in stool. Negative for abdominal distention, abdominal pain, constipation, diarrhea, nausea and vomiting.  Neurological:  Positive for weakness. Negative for dizziness and syncope.  Psychiatric/Behavioral:  The patient is not nervous/anxious.   All other systems reviewed and are negative.    Medications  Current Facility-Administered Medications:    0.9 %  sodium chloride infusion (Manually program via Guardrails IV Fluids), , Intravenous, Once, Teressa Lower, NP, Held at 01/17/23 1152   0.9 %  sodium chloride infusion (Manually program via Guardrails IV Fluids), , Intravenous, Once, Teressa Lower, NP, Held at 01/17/23 1222   0.9 %  sodium chloride infusion (Manually program via Guardrails IV Fluids), , Intravenous, Once, Teressa Lower, NP, Held at 01/17/23 1538   0.9 %  sodium chloride infusion (Manually program via Guardrails IV Fluids), , Intravenous, Once, Teressa Lower, NP, Held at 01/17/23 1736   0.9 %  sodium chloride infusion,  250 mL, Intravenous, Continuous, Rust-Chester, Huel Cote, NP, Last Rate: 10 mL/hr at 01/18/23 0948, 250 mL at 01/18/23 0948   acetaminophen (TYLENOL) tablet 650 mg, 650 mg, Oral, Q4H PRN, Athena Masse, MD, 650 mg at 01/17/23 0946   albumin human 25 % solution 12.5 g, 12.5 g, Intravenous, Once, Annita Brod, MD   cefTRIAXone (ROCEPHIN) 2 g in sodium chloride 0.9 % 100 mL IVPB, 2 g, Intravenous, Q24H, Dgayli, Berdine Addison, MD, Stopped at 01/17/23 1212   Chlorhexidine Gluconate Cloth 2 % PADS 6 each, 6 each, Topical, Daily, Annita Brod, MD   docusate sodium (COLACE) capsule 100 mg, 100 mg, Oral, BID, Teressa Lower, NP, 100 mg at 01/17/23 2300   fentaNYL (SUBLIMAZE) injection 25 mcg, 25 mcg, Intravenous, Q2H PRN, Lang Snow, NP, 25 mcg at 36/14/43 1540   folic acid 1 mg in sodium chloride 0.9 % 50 mL IVPB, 1 mg, Intravenous, Daily **OR** folic acid (FOLVITE) tablet 1 mg, 1 mg, Oral, Daily, Rust-Chester, Britton L, NP, 1 mg at 01/18/23 0926   LORazepam (ATIVAN) tablet 1-4 mg, 1-4 mg, Oral, Q1H PRN, Wynelle Cleveland, RPH, 2 mg at 01/18/23 0867   midodrine (PROAMATINE) tablet 2.5 mg, 2.5 mg, Oral, TID WC, Teressa Lower, NP, 2.5 mg at 01/18/23 6195   multivitamin with minerals tablet 1 tablet, 1 tablet, Oral, Daily, Teressa Lower, NP, 1 tablet at 01/17/23 0949   nicotine (NICODERM CQ - dosed in mg/24 hours) patch 14 mg, 14 mg, Transdermal, Daily, Judd Gaudier V, MD, 14 mg at 01/18/23 0925   norepinephrine (LEVOPHED) '4mg'$  in 250m (0.016 mg/mL) premix infusion, 2-10 mcg/min, Intravenous, Titrated,  Rust-Chester, Huel Cote, NP, Last Rate: 18.75 mL/hr at 01/18/23 0700, 5 mcg/min at 01/18/23 0700   octreotide (SANDOSTATIN) 500 mcg in sodium chloride 0.9 % 250 mL (2 mcg/mL) infusion, 50 mcg/hr, Intravenous, Continuous, Dgayli, Khabib, MD, Last Rate: 25 mL/hr at 01/18/23 0908, 50 mcg/hr at 01/18/23 0908   pantoprazole (PROTONIX) injection 40 mg, 40 mg, Intravenous, Q12H, Rust-Chester,  Britton L, NP, 40 mg at 01/18/23 0916   polyethylene glycol (MIRALAX / GLYCOLAX) packet 17 g, 17 g, Oral, Daily, Teressa Lower, NP, 17 g at 01/17/23 0946   potassium & sodium phosphates (PHOS-NAK) 280-160-250 MG packet 2 packet, 2 packet, Oral, Q4H, Coulter, Carolyn, RPH   sodium chloride flush (NS) 0.9 % injection 10-40 mL, 10-40 mL, Intracatheter, Q12H, Annita Brod, MD, 10 mL at 01/17/23 2104   sodium chloride flush (NS) 0.9 % injection 10-40 mL, 10-40 mL, Intracatheter, PRN, Annita Brod, MD   thiamine (VITAMIN B1) injection 100 mg, 100 mg, Intravenous, Daily, 100 mg at 01/18/23 0918 **OR** thiamine (VITAMIN B1) tablet 100 mg, 100 mg, Oral, Daily, Rust-Chester, Britton L, NP, 100 mg at 01/17/23 0949  sodium chloride 250 mL (01/18/23 0948)   albumin human     cefTRIAXone (ROCEPHIN)  IV Stopped (16/10/96 0454)   folic acid 1 mg in sodium chloride 0.9 % 50 mL IVPB     norepinephrine (LEVOPHED) Adult infusion 5 mcg/min (01/18/23 0700)   octreotide (SANDOSTATIN) 500 mcg in sodium chloride 0.9 % 250 mL (2 mcg/mL) infusion 50 mcg/hr (01/18/23 0908)    acetaminophen, fentaNYL (SUBLIMAZE) injection, LORazepam, sodium chloride flush   Objective    Vitals:   01/18/23 0800 01/18/23 0900 01/18/23 0902 01/18/23 0909  BP: 100/66 (!) 88/52 (!) 85/51 (!) 95/55  Pulse: (!) 102 95 94 95  Resp: (!) 23 (!) 21 (!) 21 18  Temp:      TempSrc:      SpO2: 96% 96% 96% 95%  Weight:      Height:         Physical Exam Vitals and nursing note reviewed.  Constitutional:      General: He is not in acute distress.    Appearance: He is obese. He is ill-appearing. He is not toxic-appearing or diaphoretic.     Comments: Chronically ill appearing  HENT:     Head: Normocephalic and atraumatic.     Nose: Nose normal.     Mouth/Throat:     Mouth: Mucous membranes are moist.     Pharynx: Oropharynx is clear.  Eyes:     General: No scleral icterus.    Extraocular Movements: Extraocular movements  intact.  Cardiovascular:     Rate and Rhythm: Regular rhythm. Tachycardia present.     Heart sounds: No murmur heard.    No friction rub. No gallop.  Pulmonary:     Effort: Respiratory distress present.     Breath sounds: Rhonchi present. No wheezing or rales.     Comments: Coarse breath sounds bilaterally Abdominal:     General: Bowel sounds are normal. There is distension.     Palpations: Abdomen is soft.     Tenderness: There is no abdominal tenderness. There is no guarding or rebound.  Musculoskeletal:     Cervical back: Neck supple.     Right lower leg: Edema present.     Left lower leg: Edema present.  Skin:    General: Skin is warm and dry.     Coloration: Skin is not jaundiced or pale.  Neurological:     Mental Status: He is alert and oriented to person, place, and time. Mental status is at baseline.  Psychiatric:     Comments: Seems to lack insight into health status      Laboratory Data Recent Labs  Lab 01/16/23 2347 01/17/23 1127 01/17/23 2015 01/18/23 0204  WBC 9.0  --  8.8 10.9*  HGB 6.3* 6.0* 7.5* 9.3*  HCT 20.0* 18.3* 22.9* 27.6*  PLT 213  --  206 249  NEUTOPHILPCT 67  --  76 68  LYMPHOPCT 20  --  12 17  MONOPCT 11  --  10 12  EOSPCT 1  --  1 1   Recent Labs  Lab 01/16/23 2347 01/17/23 1142 01/17/23 2015 01/18/23 0204  NA 128* 128*  --  129*  K 2.8* 2.9* 4.2 3.9  CL 94* 96*  --  100  CO2 20* 23  --  22  BUN 20 18  --  12  CREATININE 1.45* 1.30*  --  0.92  CALCIUM 7.6* 7.2*  --  7.1*  PROT 6.8  --   --  6.7  BILITOT 1.5*  --   --  3.1*  ALKPHOS 271*  --   --  222*  ALT 24  --   --  28  AST 99*  --   --  90*  GLUCOSE 109* 172*  --  217*   Recent Labs  Lab 01/17/23 0043 01/18/23 0204  INR 1.3* 1.3*      Imaging Studies: DG Abd 1 View  Result Date: 01/18/2023 CLINICAL DATA:  [id number] Abdominal pain [id number] EXAM: ABDOMEN - 1 VIEW COMPARISON:  CT abdomen pelvis 01/17/2023 FINDINGS: A loop of small bowel within the right lower abdomen  appears distended with gas up to 3 cm. No radio-opaque calculi or other significant radiographic abnormality are seen. IMPRESSION: 1. Nonspecific loop of small bowel within the right lower abdomen appears distended with gas up to 3 cm. 2. Otherwise nonobstructive bowel gas pattern. Electronically Signed   By: Iven Finn M.D.   On: 01/18/2023 02:11   DG Chest 1 View  Result Date: 01/18/2023 CLINICAL DATA:  [id number] SOB (shortness of breath) [id number] EXAM: CHEST  1 VIEW COMPARISON:  Chest x-ray 01/16/2023. FINDINGS: Interval placement of a right PICC with tip overlying the expected region of the superior cavoatrial junction. The heart and mediastinal contours are unchanged No focal consolidation. Chronic coarsened interstitial markings with no overt pulmonary edema. No pleural effusion. No pneumothorax. No acute osseous abnormality. IMPRESSION: 1. No active disease. 2. Interval placement of a right PICC with tip overlying the expected region of the superior cavoatrial junction. Electronically Signed   By: Iven Finn M.D.   On: 01/18/2023 02:03   [place] EKG SITE RITE  Result Date: 01/17/2023 If [facility] image not attached, placement could not be confirmed due to current cardiac rhythm.  ECHOCARDIOGRAM COMPLETE  Result Date: 01/17/2023    ECHOCARDIOGRAM REPORT   Patient Name:   Ethan Wells Date of Exam: 01/17/2023 Medical Rec #:  [id number]           Height:       65.0 in Accession #:    [id number]          Weight:       224.2 lb Date of Birth:  01-Mar-1971           BSA:          2.077 m Patient Age:  51 years            BP:           106/69 mmHg Patient Gender: M                   HR:           92 bpm. Exam Location:  [facility] Procedure: 2D Echo, Cardiac Doppler and Color Doppler Indications:     NSTEMI I21.4  History:         Patient has prior history of Echocardiogram examinations, most                  recent 10/30/2022. CAD; Risk Factors:Hypertension. Ischemic                  cardiomyopathy, tobacco  abuse.  Sonographer:     Sherrie Sport Referring Phys:  [id number] Athena Masse Diagnosing Phys: Ida Rogue MD  Sonographer Comments: Technically challenging study due to limited acoustic windows, no apical window and no subcostal window. Image acquisition challenging due to patient body habitus. IMPRESSIONS  1. Left ventricular ejection fraction, by estimation, is 50 to 55%. The left ventricle has low normal function. The left ventricle has no regional wall motion abnormalities. Left ventricular diastolic parameters are indeterminate.  2. Right ventricular systolic function is normal. The right ventricular size is normal.  3. The mitral valve is normal in structure. Mild mitral valve regurgitation. No evidence of mitral stenosis.  4. The aortic valve is normal in structure. Aortic valve regurgitation is not visualized. No aortic stenosis is present.  5. The inferior vena cava is normal in size with greater than 50% respiratory variability, suggesting right atrial pressure of 3 mmHg. FINDINGS  Left Ventricle: Left ventricular ejection fraction, by estimation, is 50 to 55%. The left ventricle has low normal function. The left ventricle has no regional wall motion abnormalities. The left ventricular internal cavity size was normal in size. There is no left ventricular hypertrophy. Left ventricular diastolic parameters are indeterminate. Right Ventricle: The right ventricular size is normal. No increase in right ventricular wall thickness. Right ventricular systolic function is normal. Left Atrium: Left atrial size was normal in size. Right Atrium: Right atrial size was normal in size. Pericardium: There is no evidence of pericardial effusion. Mitral Valve: The mitral valve is normal in structure. Mild mitral valve regurgitation. No evidence of mitral valve stenosis. Tricuspid Valve: The tricuspid valve is normal in structure. Tricuspid valve regurgitation is not demonstrated. No evidence of tricuspid stenosis. Aortic  Valve: The aortic valve is normal in structure. Aortic valve regurgitation is not visualized. No aortic stenosis is present. Pulmonic Valve: The pulmonic valve was normal in structure. Pulmonic valve regurgitation is not visualized. No evidence of pulmonic stenosis. Aorta: The aortic root is normal in size and structure. Venous: The inferior vena cava is normal in size with greater than 50% respiratory variability, suggesting right atrial pressure of 3 mmHg. IAS/Shunts: No atrial level shunt detected by color flow Doppler.  LEFT VENTRICLE PLAX 2D LVIDd:         5.60 cm LVIDs:         4.35 cm LV PW:         0.95 cm LV IVS:        0.80 cm LVOT diam:     2.10 cm LVOT Area:     3.46 cm  LEFT ATRIUM         Index LA diam:  3.60 cm 1.73 cm/m   AORTA Ao Root diam: 3.00 cm  SHUNTS Systemic Diam: 2.10 cm Ida Rogue MD Electronically signed by Ida Rogue MD Signature Date/Time: 01/17/2023/3:25:48 PM    Final    US Venous Img Lower Bilateral (DVT)  Result Date: 01/17/2023 CLINICAL DATA:  CHF EXAM: BILATERAL LOWER EXTREMITY VENOUS DOPPLER ULTRASOUND TECHNIQUE: Gray-scale sonography with compression, as well as color and duplex ultrasound, were performed to evaluate the deep venous system(s) from the level of the common femoral vein through the popliteal and proximal calf veins. COMPARISON:  None Available. FINDINGS: VENOUS Normal compressibility of the common femoral, superficial femoral, and popliteal veins, as well as the visualized calf veins. Visualized portions of profunda femoral vein and great saphenous vein unremarkable. No filling defects to suggest DVT on grayscale or color Doppler imaging. Doppler waveforms show normal direction of venous flow, normal respiratory plasticity and response to augmentation. Subcutaneous edema in the bilateral lower legs. IMPRESSION: Negative for DVT in the lower extremities. Electronically Signed   By: Jorje Guild M.D.   On: 01/17/2023 06:56   CT ANGIO GI  BLEED  Result Date: 01/17/2023 CLINICAL DATA:  Acute blood loss anemia with rectal bleeding EXAM: CTA ABDOMEN AND PELVIS WITHOUT AND WITH CONTRAST TECHNIQUE: Multidetector CT imaging of the abdomen and pelvis was performed using the standard protocol during bolus administration of intravenous contrast. Multiplanar reconstructed images and MIPs were obtained and reviewed to evaluate the vascular anatomy. RADIATION DOSE REDUCTION: This exam was performed according to the departmental dose-optimization program which includes automated exposure control, adjustment of the mA and/or kV according to patient size and/or use of iterative reconstruction technique. CONTRAST:  166m OMNIPAQUE IOHEXOL 350 MG/ML SOLN COMPARISON:  None Available. FINDINGS: VASCULAR Aorta: Normal caliber aorta without aneurysm, dissection, vasculitis or significant stenosis. Scattered calcified atherosclerotic plaque. Celiac: Severe focal stenosis at the origin of the celiac artery similar to 09/30/2022. SMA: Interval placement of a stent at the origin of the SMA. The stent is patent. Renals: Both renal arteries are patent without evidence of aneurysm, dissection, vasculitis, fibromuscular dysplasia or significant stenosis. IMA: Patent without evidence of aneurysm, dissection, vasculitis or significant stenosis. Inflow: Patent without evidence of aneurysm, dissection, vasculitis or significant stenosis. Proximal Outflow: Bilateral common femoral and visualized portions of the superficial and profunda femoral arteries are patent without evidence of aneurysm, dissection, vasculitis or significant stenosis. Veins: Patent portal vein and IVC. Review of the MIP images confirms the above findings. NON-VASCULAR Lower chest: No acute abnormality. Hepatobiliary: Cirrhosis. Nondistended gallbladder. Gallbladder wall thickening and pericholecystic fluid favored related to cirrhosis/volume status. No biliary dilation. Pancreas: Unremarkable. Spleen: The  spleen measures 14.5 cm in craniocaudal dimension. There is at the upper limits of normal in size. Adrenals/Urinary Tract: Adrenal glands are unremarkable. Kidneys are normal, without renal calculi, focal lesion, or hydronephrosis. Bladder is unremarkable. Stomach/Bowel: There is a focal hyperdensity on arterial phase images (series 11/image 1041) which diffuse is on portal venous phase and is not present on noncontrast images. This is compatible with small focus of active bleed. Hyperdense stool is present throughout the colon on noncontrast images and limits assessment for additional active bleeding. Normal caliber large and small bowel. Wall thickening about the sigmoid colon and rectum. Additional wall thickening versus underdistention of the ascending colon. Wall thickening and adjacent stranding and fluid about the duodenum. Lymphatic: No suspicious lymphadenopathy. Reproductive: Unremarkable. Other: Small amount of free fluid in the pelvis. Central mesenteric edema. No free intraperitoneal air. Musculoskeletal: No acute osseous  abnormality. IMPRESSION: VASCULAR Findings suspicious for small active bleed in the rectum. Severe stenosis at the origin of the celiac axis is redemonstrated. Patent stent at the origin of the SMA. NON-VASCULAR Wall thickening about the sigmoid colon/rectum as well as the transverse colon favored due to colitis however congestive colopathy could appear similarly. Duodenal wall thickening suggesting duodenitis. Cirrhosis and borderline splenomegaly compatible with portal hypertension. These results were called by telephone at the time of interpretation on 01/17/2023 at 3:19 am to provider [facility] , who verbally acknowledged these results. Electronically Signed   By: Placido Sou M.D.   On: 01/17/2023 03:25   DG Chest Portable 1 View  Result Date: 01/16/2023 CLINICAL DATA:  Volume overload EXAM: PORTABLE CHEST 1 VIEW COMPARISON:  12/12/2022 FINDINGS: Cardiac shadow is enlarged  but stable. No significant vascular congestion is noted. No edema is seen. The lungs are clear. No bony abnormality is noted. IMPRESSION: No active disease. Electronically Signed   By: Inez Catalina M.D.   On: 01/16/2023 23:42    Assessment:   Rectal bleeding/anema/melena/dark emesis- broad ddx- recommending egd and flex sig however needs to be done when clinically feasible-  would like hgb between 7-9 and metabolic derangements corrected prior to consideration of sedated procedures-  S/p prbc, ffp, and vitamin k Hemorrhoids- have been source of bleeding in the past; s/p hemorrhoidectomy 10/28/22 Mesenteric ischemia hx Left sided colopathy vs colitis- ddx includes relating to cirrhosis or colonic ischemia given hypotension episodes and BRBPR Cirrhosis- likely alcoholic/substance dependence MELD 3.0 (22) Polysubstance abuse; cocaine positive; says last used 01/11/23; using etoh and tobacco Chronic pain CAD    Plan:  UDS today negative for cocaine Pt had issues with his respiratory status o/n and this morning requiring bipap and now on nasal cannula. He quickly desaturates Also has issues maintaing BP off of levophed. Now being administered midodrine as well  His rectal bleeding has slowed and hgb improved post transfusion to 9.3  Continue protonix, octreotide, and ceftriaxone at this time Will shift endoscopy plan as patient's clinical condition improves Concerned that his hypotension is not all related to blood loss as he has had this issue chronically and despite adequate resuscitation, this has not improved as much as one would expect.  [place] for full liquids at this time. Avoid frequent lab draws to prevent lab induced anemia Supportive care and antiemetics as per primary team Maintain two sites IV access Avoid nsaids Monitor for GIB.  Case discussed with anesthesia, pccm, and primary team. Will continue to monitor  I personally performed the service.  Management of other medical  comorbidities as per primary team  Thank you for allowing [place] to participate in this patient's care. Please don't hesitate to call if any questions or concerns arise.   Annamaria Helling, DO [facility]  Portions of the record may have been created with voice recognition software. Occasional wrong-word or 'sound-a-like' substitutions may have occurred due to the inherent limitations of voice recognition software.  Read the chart carefully and recognize, using context, where substitutions may have occurred.

## 2023-01-19 ENCOUNTER — Other Ambulatory Visit: Payer: Self-pay

## 2023-01-19 DIAGNOSIS — I2489 Other forms of acute ischemic heart disease: Secondary | ICD-10-CM | POA: Diagnosis not present

## 2023-01-19 DIAGNOSIS — D62 Acute posthemorrhagic anemia: Secondary | ICD-10-CM | POA: Diagnosis not present

## 2023-01-19 DIAGNOSIS — R578 Other shock: Secondary | ICD-10-CM | POA: Diagnosis not present

## 2023-01-19 DIAGNOSIS — I2510 Atherosclerotic heart disease of native coronary artery without angina pectoris: Secondary | ICD-10-CM | POA: Diagnosis not present

## 2023-01-19 LAB — HEPATIC FUNCTION PANEL
ALT: 32 U/L (ref 0–44)
AST: 103 U/L — ABNORMAL HIGH (ref 15–41)
Albumin: 2.7 g/dL — ABNORMAL LOW (ref 3.5–5.0)
Alkaline Phosphatase: 225 U/L — ABNORMAL HIGH (ref 38–126)
Bilirubin, Direct: 1.8 mg/dL — ABNORMAL HIGH (ref 0.0–0.2)
Indirect Bilirubin: 2.3 mg/dL — ABNORMAL HIGH (ref 0.3–0.9)
Total Bilirubin: 4.1 mg/dL — ABNORMAL HIGH (ref 0.3–1.2)
Total Protein: 6.8 g/dL (ref 6.5–8.1)

## 2023-01-19 LAB — CBC WITH DIFFERENTIAL/PLATELET
Abs Immature Granulocytes: 0.04 10*3/uL (ref 0.00–0.07)
Basophils Absolute: 0.1 10*3/uL (ref 0.0–0.1)
Basophils Relative: 1 %
Eosinophils Absolute: 0.1 10*3/uL (ref 0.0–0.5)
Eosinophils Relative: 1 %
HCT: 27.2 % — ABNORMAL LOW (ref 39.0–52.0)
Hemoglobin: 8.7 g/dL — ABNORMAL LOW (ref 13.0–17.0)
Immature Granulocytes: 0 %
Lymphocytes Relative: 17 %
Lymphs Abs: 1.6 10*3/uL (ref 0.7–4.0)
MCH: 28.2 pg (ref 26.0–34.0)
MCHC: 32 g/dL (ref 30.0–36.0)
MCV: 88.3 fL (ref 80.0–100.0)
Monocytes Absolute: 1.2 10*3/uL — ABNORMAL HIGH (ref 0.1–1.0)
Monocytes Relative: 13 %
Neutro Abs: 6.3 10*3/uL (ref 1.7–7.7)
Neutrophils Relative %: 68 %
Platelets: 168 10*3/uL (ref 150–400)
RBC: 3.08 MIL/uL — ABNORMAL LOW (ref 4.22–5.81)
RDW: 17.2 % — ABNORMAL HIGH (ref 11.5–15.5)
WBC: 9.3 10*3/uL (ref 4.0–10.5)
nRBC: 0 % (ref 0.0–0.2)

## 2023-01-19 LAB — BASIC METABOLIC PANEL
Anion gap: 7 (ref 5–15)
BUN: 11 mg/dL (ref 6–20)
CO2: 25 mmol/L (ref 22–32)
Calcium: 7.9 mg/dL — ABNORMAL LOW (ref 8.9–10.3)
Chloride: 101 mmol/L (ref 98–111)
Creatinine, Ser: 0.89 mg/dL (ref 0.61–1.24)
GFR, Estimated: >60 mL/min (ref >60)
Glucose, Bld: 119 mg/dL — ABNORMAL HIGH (ref 70–99)
Potassium: 4 mmol/L (ref 3.5–5.1)
Sodium: 133 mmol/L — ABNORMAL LOW (ref 135–145)

## 2023-01-19 LAB — BPAM PLATELET PHERESIS
Blood Product Expiration Date: 202402032359
ISSUE DATE / TIME: 202402021817
Unit Type and Rh: 9500

## 2023-01-19 LAB — PREPARE PLATELET PHERESIS: Unit division: 0

## 2023-01-19 LAB — PHOSPHORUS: Phosphorus: 2.2 mg/dL — ABNORMAL LOW (ref 2.5–4.6)

## 2023-01-19 MED ORDER — FUROSEMIDE 10 MG/ML IJ SOLN
40.0000 mg | Freq: Once | INTRAMUSCULAR | Status: AC
  Administered 2023-01-19: 40 mg via INTRAVENOUS
  Filled 2023-01-19: qty 4

## 2023-01-19 MED ORDER — FLEET ENEMA 7-19 GM/118ML RE ENEM: 2.0000 | ENEMA | RECTAL | Status: DC

## 2023-01-19 MED ORDER — SODIUM PHOSPHATES 45 MMOLE/15ML IV SOLN
15.0000 mmol | Freq: Once | INTRAVENOUS | Status: AC
  Administered 2023-01-19: 15 mmol via INTRAVENOUS
  Filled 2023-01-19: qty 5

## 2023-01-19 MED ORDER — MIDODRINE HCL 5 MG PO TABS
10.0000 mg | ORAL_TABLET | Freq: Three times a day (TID) | ORAL | Status: DC
  Administered 2023-01-19 – 2023-01-31 (×36): 10 mg via ORAL
  Filled 2023-01-19 (×36): qty 2

## 2023-01-19 MED ORDER — ALBUMIN HUMAN 25 % IV SOLN
12.5000 g | Freq: Once | INTRAVENOUS | Status: AC
  Administered 2023-01-19: 12.5 g via INTRAVENOUS
  Filled 2023-01-19: qty 50

## 2023-01-19 NOTE — Progress Notes (Signed)
[facility] Progress Note  [name]    OMB:[id number]  DOA: 01/16/2023    Date of Service: the patient was seen and examined on 01/19/2023  Brief hospital course: 52 year old male with past medical history of morbid obesity, CAD, polysubstance abuse, mesenteric ischemia status post stent and history of recent hemorrhoidal bleeding status post hemorrhoidectomy which was complicated by recurrent bleeding requiring repeat visit to the OR and non-STEMI.  At that time, patient was transfused 1 unit packed red blood cell transfusion.  Patient presented back to the emergency room on the night of 2/1 with complaints of lower extremity swelling and dyspnea on exertion.  Patient found to have acute kidney injury, hypotension with systolic blood pressure in the 80s tachypnea and hemoglobin of 6.3.  Vascular imaging noted active rectal bleeding and patient admitted for hemorrhagic shock with secondary blood loss anemia.  Also felt to have high-output heart failure.  Transfused 2 units packed red blood cells plus blood on IV fluids.  Patient also had elevated troponins which peaked as high as the 400s.  General surgery consulted.   Assessment and Plan: * Hemorrhagic shock (HCC)-resolved as of 01/18/2023 Secondary to acute on chronic blood loss anemia/symptomatic anemia Hematochezia, suspect secondary to hemorrhoidal bleeding History of hemorrhoidectomy November 2023 Patient has a history of bleeding hemorrhoids requiring hemorrhoidectomy in November 4536 complicated by rebleed and now reports bleeding with each BM CTA showing "Findings suspicious for small active bleed in the rectum".  Treat with IV fluids and blood transfusions.  General surgery and GI following.  Off of pressor support.  Hemoglobin relatively stable, unchanged in last 12 hours.  With normal urine drug screen now, GI plans to take patient tentatively for EGD and colonoscopy tomorrow.   Acute blood loss anemia Secondary  to hemorrhoidal bleeding.  An ongoing problem.  General surgery recommending no surgical intervention at this time.  Gastroenterology consulted recommending blood transfusion keeping hemoglobin above 7-8.  Avoid NSAIDs.  Plan is for EGD/colonoscopy tentatively tomorrow  Acute on chronic heart failure with preserved ejection fraction (HFpEF) ([place]) Suspect high-output heart failure from ongoing blood loss.,  Also with drug use.  Echocardiogram results noted ejection fraction 50 to 55% with indeterminate diastolic function.  Diuresing as blood pressure allows.  Net neutral now although has diuresed Over 5 L since hospitalization.  Demand ischemia Appreciate cardiology help.  Elevated troponins felt to be less likely due to acute non-STEMI more likely due to demand mismatch from blood loss anemia.  No beta-blocker for cocaine use.  Aspirin on hold due to anemia.  Continue to monitor.  Acute kidney injury (HCC)-resolved as of 01/18/2023 Secondary to blood loss anemia.  Improving with IV fluids and blood transfusion  Essential hypertension Blood pressure medications on hold due to hypotension from blood loss.  Off of pressor support, have added midodrine.  Cocaine dependence with cocaine-induced disorder ([place]) Will counsel.  Drug screen negative.  Able to proceed with procedures  Alcohol use disorder CIWA withdrawal protocol  Tobacco abuse Will counsel to quit  Morbid obesity ([place]) Meets criteria with BMI greater than 35 and comorbidities of CAD, CHF  Mesenteric artery stenosis (HCC) S/p stent 10/2022  Cocaine abuse ([place]) UDS positive for cocaine       Body mass index is 37.31 kg/m.        Consultants: General surgery Gastroenterology Critical care Cardiology  Procedures: Echocardiogram noting ejection fraction on low end of normal at 50 to 55%, indeterminate diastolic function Status post multiple  units packed red blood cells Planned EGD and  colonoscopy  Antimicrobials: None  Code Status: Full code   Subjective: Resting comfortably  Objective:  Vitals:   01/19/23 1200 01/19/23 1300  BP: (!) 89/47 102/61  Pulse: 86 89  Resp: 17 17  Temp:    SpO2: 97% 95%    Intake/Output Summary (Last 24 hours) at 01/19/2023 1516 Last data filed at 01/19/2023 1345 Gross per 24 hour  Intake 568.7 ml  Output 2000 ml  Net -1431.3 ml   Filed Weights   01/16/23 2305 01/17/23 0430  Weight: 91.2 kg 101.7 kg   Body mass index is 37.31 kg/m.  Exam:  General: Fatigued, no acute distress HEENT: Normocephalic, atraumatic, mucous membranes slightly dry Cardiovascular: Irregular rhythm, rate controlled Respiratory: Decreased breath sounds throughout secondary to body habitus Abdomen: Soft, obese, nontender, positive bowel sounds Musculoskeletal: No clubbing or cyanosis, trace pitting edema Skin: No skin breaks, tears or lesions Psychiatry: Appropriate, but groggy Neurology: No focal deficits  Data Reviewed: Sodium up to 133, hemoglobin 8.7  Disposition:  Status is: Inpatient Remains inpatient appropriate because:  -Stabilization of blood pressure -EGD and colonoscopy -Full diuresis    Anticipated discharge date: 2/7  Family Communication: Will call family DVT Prophylaxis: SCDs Start: 01/17/23 0414    Author: Annita Brod ,MD 01/19/2023 3:16 PM  To reach On-call, see care teams to locate the attending and reach out via [email or web address]. Between 7PM-7AM, please contact night-coverage If you still have difficulty reaching the attending provider, please page the [facility] (Director on Call) for [facility] on amion for assistance.

## 2023-01-19 NOTE — Progress Notes (Addendum)
GI Inpatient Follow-up Note  Subjective:  Patient seen in follow-up for symptomatic anemia and rectal bleeding. No acute events overnight. Per nursing, no overt gastrointestinal bleeding. He had BM last night which was without overt hematochezia or melena. His hemoglobin has remained stable with no significant hemodynamic changes. Hemoglobin 8.7 this morning. He is off vasopressors. Patient denies any new GI complaints. He reports chronic pain all over.   Scheduled Inpatient Medications:   Chlorhexidine Gluconate Cloth  6 each Topical Daily   docusate sodium  100 mg Oral BID   folic acid  1 mg Oral Daily   midodrine  10 mg Oral TID WC   multivitamin with minerals  1 tablet Oral Daily   nicotine  14 mg Transdermal Daily   pantoprazole (PROTONIX) IV  40 mg Intravenous Q12H   polyethylene glycol  17 g Oral Daily   sodium chloride flush  10-40 mL Intracatheter Q12H   thiamine (VITAMIN B1) injection  100 mg Intravenous Daily   Or   thiamine  100 mg Oral Daily    Continuous Inpatient Infusions:    sodium chloride Stopped (01/19/23 0234)   cefTRIAXone (ROCEPHIN)  IV Stopped (02/58/52 7782)   folic acid 1 mg in sodium chloride 0.9 % 50 mL IVPB     octreotide (SANDOSTATIN) 500 mcg in sodium chloride 0.9 % 250 mL (2 mcg/mL) infusion 50 mcg/hr (01/19/23 0553)   sodium phosphate 15 mmol in dextrose 5 % 250 mL infusion 15 mmol (01/19/23 0934)    PRN Inpatient Medications:  acetaminophen, LORazepam, morphine injection, sodium chloride flush  Review of Systems: Constitutional: Weight is stable.  Eyes: No changes in vision. ENT: No oral lesions, sore throat.  GI: see HPI.  Heme/Lymph: No easy bruising.  CV: No chest pain.  GU: No hematuria.  Integumentary: No rashes.  Neuro: No headaches.  Psych: + anxiety  Endocrine: No heat/cold intolerance.  Allergic/Immunologic: No urticaria.  Resp: + cough + SOB Musculoskeletal: No joint swelling.    Physical Examination: BP 102/60   Pulse  92   Temp 98.2 F (36.8 C) (Oral)   Resp 15   Ht '5\' 5"'$  (1.651 m)   Wt 101.7 kg   SpO2 95%   BMI 37.31 kg/m  Obese, ill-appearing male in bed. Non-toxic appearing.  Gen: NAD, alert and oriented x 4 HEENT: PERRL, EOMI, Neck: supple, no JVD or thyromegaly Chest: Coarse breath sounds bilaterally, no accessory muscle use when breathing CV: RRR, no m/g/c/r Abd: protuberant, soft, NT, +BS in all four quadrants; no HSM, guarding, ridigity, or rebound tenderness Ext: 1+ bilateral lower extremity edema, well perfused with 2+ pulses, Skin: no rash or lesions noted  Data: Lab Results  Component Value Date   WBC 9.3 01/19/2023   HGB 8.7 (L) 01/19/2023   HCT 27.2 (L) 01/19/2023   MCV 88.3 01/19/2023   PLT 168 01/19/2023   Recent Labs  Lab 01/18/23 0204 01/18/23 1441 01/19/23 0422  HGB 9.3* 8.8* 8.7*   Lab Results  Component Value Date   NA 133 (L) 01/19/2023   K 4.0 01/19/2023   CL 101 01/19/2023   CO2 25 01/19/2023   BUN 11 01/19/2023   CREATININE 0.89 01/19/2023   Lab Results  Component Value Date   ALT 32 01/19/2023   AST 103 (H) 01/19/2023   ALKPHOS 225 (H) 01/19/2023   BILITOT 4.1 (H) 01/19/2023   Recent Labs  Lab 01/17/23 0043 01/18/23 0204  APTT 37*  --   INR 1.3* 1.3*  Assessment/Plan:  52 y/o Caucasian male with a PMH of CAD s/p anterior STEMI and PCI 2012, hx of NSTEMI, chronic pain disorder, hx of mesenteric ischemic s/p stenting, depression, anxiety, GERD, HTN, HLD, migraines, active polysubstance abuse, and hx of hemorrhoidal bleeding s/p hemorrhoidectomy 10/2022 admitted 2/2 rectal bleeding and anemia.   Rectal bleeding/Anemia/Melena/Dark emesis - broad ddx- H&H stable currently with no signs of overt gastrointestinal bleeding. Hgb 8.7 this morning.   Hemorrhoids - have been source of bleeding in the past; s/p hemorrhoidectomy 10/28/22  Hx of mesenteric ischemia - Left sided colopathy vs colitis- ddx includes relating to cirrhosis or colonic  ischemia given hypotension episodes and BRBPR  Cirrhosis - likely 2/2 alcohol/substance dependence, MELD 3.0 (22)  Hx of polysubstance abuse - UDS now negative for cocaine   Chronic pain disorder  CAD  Recommendations:  - Continue to monitor serial H&H. Transfuse for Hgb <7.0.  - Respiratory status improved. He is off vasopressors.  - Continue to monitor for signs of GI bleeding - Continue Protonix, Octreotide, and Ceftriaxone at this time - Plan for EGD and flexible sigmoidoscopy tomorrow with Dr. Virgina Jock - Continue management of comorbidities per primary team - Continue full liquid diet. NPO midnight.  - See procedure notes for findings and further recommendations  I reviewed the risks (including bleeding, perforation, infection, anesthesia complications, cardiac/respiratory complications), benefits and alternatives of EGD and flexible sigmoidoscopy. Patient consents to proceed.    Please call with questions or concerns.  Octavia Bruckner, PA-C [facility] [phone]

## 2023-01-19 NOTE — Plan of Care (Signed)
Discussed with patient plan of care for the evening, pain management and getting back to bed with some teach back displayed.    Problem: Education: Goal: Understanding of cardiac disease, CV risk reduction, and recovery process will improve Outcome: Progressing   Problem: Activity: Goal: Ability to tolerate increased activity will improve Outcome: Progressing

## 2023-01-19 NOTE — Consult Note (Signed)
PHARMACY CONSULT NOTE - ELECTROLYTES  Pharmacy Consult for Electrolyte Monitoring and Replacement   Recent Labs: Potassium (mmol/L)  Date Value  01/19/2023 4.0  09/06/2013 3.5   Magnesium (mg/dL)  Date Value  01/18/2023 1.9  03/09/2013 2.2   Calcium (mg/dL)  Date Value  01/19/2023 7.9 (L)   Calcium, Total (mg/dL)  Date Value  09/06/2013 8.9   Albumin (g/dL)  Date Value  01/19/2023 2.7 (L)  09/06/2013 4.0   Phosphorus (mg/dL)  Date Value  01/19/2023 2.2 (L)   Sodium (mmol/L)  Date Value  01/19/2023 133 (L)  09/06/2013 134 (L)   Corrected Ca: 8.2 mg/dL  Assessment  Ethan Wells is a 52 y.o. male presenting with hemorrhagic shock and acute on HFpEF. PMH significant for PSA (marijuana, cocaine), CAD (hx NSTEMI, STEMI), anemia 2/2 hemorrhoidal bleeding, AUD, mesentery ischemia s/p stent. Pharmacy has been consulted to monitor and replace electrolytes.  Diet: NPO Pertinent medications: octreotide gtt @ 50 mcg/hr, albumin 12.5g IV x1  Goal of Therapy: Electrolytes WNL  Plan:  Potassium: 4.2 >> 4.0, no replacement needed Magnesium: 1.7 >> 1.9, no replacement needed Phosphorus: 2.5 >> 2.2, NaPhos 30mol IV x 1 ordered Sodium: 129 >> 133, see above Check BMP, Phos with AM labs  Thank you for allowing pharmacy to be a part of this patient's care.  [name] PharmD Clinical Pharmacist 01/19/2023 10:41 AM

## 2023-01-20 ENCOUNTER — Encounter
Admission: EM | Disposition: A | Discharge status: Home / Self Care | Payer: Self-pay | Source: Home / Self Care | Attending: Internal Medicine

## 2023-01-20 ENCOUNTER — Encounter: Payer: Self-pay | Admitting: Certified Registered Nurse Anesthetist

## 2023-01-20 DIAGNOSIS — D62 Acute posthemorrhagic anemia: Secondary | ICD-10-CM | POA: Diagnosis not present

## 2023-01-20 DIAGNOSIS — J9601 Acute respiratory failure with hypoxia: Secondary | ICD-10-CM | POA: Diagnosis present

## 2023-01-20 DIAGNOSIS — I5033 Acute on chronic diastolic (congestive) heart failure: Secondary | ICD-10-CM | POA: Diagnosis not present

## 2023-01-20 DIAGNOSIS — R578 Other shock: Secondary | ICD-10-CM | POA: Diagnosis not present

## 2023-01-20 LAB — BASIC METABOLIC PANEL
Anion gap: 7 (ref 5–15)
BUN: 8 mg/dL (ref 6–20)
CO2: 24 mmol/L (ref 22–32)
Calcium: 8 mg/dL — ABNORMAL LOW (ref 8.9–10.3)
Chloride: 102 mmol/L (ref 98–111)
Creatinine, Ser: 0.71 mg/dL (ref 0.61–1.24)
GFR, Estimated: >60 mL/min (ref >60)
Glucose, Bld: 99 mg/dL (ref 70–99)
Potassium: 3.7 mmol/L (ref 3.5–5.1)
Sodium: 133 mmol/L — ABNORMAL LOW (ref 135–145)

## 2023-01-20 LAB — CBC WITH DIFFERENTIAL/PLATELET
Abs Immature Granulocytes: 0.03 10*3/uL (ref 0.00–0.07)
Basophils Absolute: 0.1 10*3/uL (ref 0.0–0.1)
Basophils Relative: 1 %
Eosinophils Absolute: 0.1 10*3/uL (ref 0.0–0.5)
Eosinophils Relative: 2 %
HCT: 27.5 % — ABNORMAL LOW (ref 39.0–52.0)
Hemoglobin: 8.6 g/dL — ABNORMAL LOW (ref 13.0–17.0)
Immature Granulocytes: 0 %
Lymphocytes Relative: 15 %
Lymphs Abs: 1.3 10*3/uL (ref 0.7–4.0)
MCH: 27.8 pg (ref 26.0–34.0)
MCHC: 31.3 g/dL (ref 30.0–36.0)
MCV: 89 fL (ref 80.0–100.0)
Monocytes Absolute: 0.9 10*3/uL (ref 0.1–1.0)
Monocytes Relative: 10 %
Neutro Abs: 6.1 10*3/uL (ref 1.7–7.7)
Neutrophils Relative %: 72 %
Platelets: 146 10*3/uL — ABNORMAL LOW (ref 150–400)
RBC: 3.09 MIL/uL — ABNORMAL LOW (ref 4.22–5.81)
RDW: 17.2 % — ABNORMAL HIGH (ref 11.5–15.5)
WBC: 8.5 10*3/uL (ref 4.0–10.5)
nRBC: 0 % (ref 0.0–0.2)

## 2023-01-20 LAB — MAGNESIUM: Magnesium: 1.8 mg/dL (ref 1.7–2.4)

## 2023-01-20 LAB — BRAIN NATRIURETIC PEPTIDE: B Natriuretic Peptide: 2540.3 pg/mL — ABNORMAL HIGH (ref 0.0–100.0)

## 2023-01-20 LAB — PHOSPHORUS: Phosphorus: 2.6 mg/dL (ref 2.5–4.6)

## 2023-01-20 LAB — TROPONIN I (HIGH SENSITIVITY): Troponin I (High Sensitivity): 102 ng/L (ref <18)

## 2023-01-20 SURGERY — ESOPHAGOGASTRODUODENOSCOPY (EGD) WITH PROPOFOL
Anesthesia: General

## 2023-01-20 MED ORDER — MAGNESIUM SULFATE 2 GM/50ML IV SOLN
2.0000 g | Freq: Once | INTRAVENOUS | Status: AC
  Administered 2023-01-20: 2 g via INTRAVENOUS
  Filled 2023-01-20: qty 50

## 2023-01-20 MED ORDER — ORAL CARE MOUTH RINSE: 15.0000 mL | OROMUCOSAL | Status: DC | PRN

## 2023-01-20 MED ORDER — LORAZEPAM 2 MG/ML IJ SOLN
0.5000 mg | Freq: Once | INTRAMUSCULAR | Status: AC
  Administered 2023-01-20: 0.5 mg via INTRAVENOUS
  Filled 2023-01-20: qty 1

## 2023-01-20 MED ORDER — SODIUM CHLORIDE 0.9 % IV SOLN: INTRAVENOUS | Status: DC

## 2023-01-20 MED ORDER — MORPHINE SULFATE (PF) 2 MG/ML IV SOLN: 2.0000 mg | INTRAVENOUS | Status: DC | PRN

## 2023-01-20 MED ORDER — POTASSIUM CHLORIDE CRYS ER 20 MEQ PO TBCR
40.0000 meq | EXTENDED_RELEASE_TABLET | Freq: Once | ORAL | Status: AC
  Administered 2023-01-20: 40 meq via ORAL
  Filled 2023-01-20: qty 2

## 2023-01-20 MED ORDER — GUAIFENESIN-DM 100-10 MG/5ML PO SYRP
5.0000 mL | ORAL_SOLUTION | ORAL | Status: DC | PRN
  Administered 2023-01-25: 5 mL via ORAL
  Filled 2023-01-20 (×2): qty 10

## 2023-01-20 MED ORDER — OXYCODONE HCL 5 MG PO TABS
5.0000 mg | ORAL_TABLET | Freq: Four times a day (QID) | ORAL | Status: DC | PRN
  Administered 2023-01-20 – 2023-01-22 (×6): 5 mg via ORAL
  Filled 2023-01-20 (×6): qty 1

## 2023-01-20 MED ORDER — FUROSEMIDE 10 MG/ML IJ SOLN
40.0000 mg | Freq: Two times a day (BID) | INTRAMUSCULAR | Status: DC
  Administered 2023-01-20 – 2023-01-24 (×9): 40 mg via INTRAVENOUS
  Filled 2023-01-20 (×9): qty 4

## 2023-01-20 MED ORDER — MORPHINE SULFATE (PF) 2 MG/ML IV SOLN
2.0000 mg | INTRAVENOUS | Status: DC | PRN
  Administered 2023-01-20 – 2023-01-22 (×10): 2 mg via INTRAVENOUS
  Filled 2023-01-20 (×10): qty 1

## 2023-01-20 MED ORDER — ALBUMIN HUMAN 25 % IV SOLN
12.5000 g | Freq: Once | INTRAVENOUS | Status: AC
  Administered 2023-01-20: 12.5 g via INTRAVENOUS
  Filled 2023-01-20: qty 50

## 2023-01-20 MED ORDER — CHLORHEXIDINE GLUCONATE CLOTH 2 % EX PADS
6.0000 | MEDICATED_PAD | Freq: Every day | CUTANEOUS | Status: DC
  Administered 2023-01-21 – 2023-01-25 (×5): 6 via TOPICAL

## 2023-01-20 NOTE — Progress Notes (Signed)
Sent Dr. Maryland Pink secure chat and made MD aware that patient is short of breath with increased work of breathing and anxious. MD stated he would come see patient.

## 2023-01-20 NOTE — Progress Notes (Signed)
Made Dr. Maryland Pink aware that patient is complaining of shortness of breath, o2 sats 89% on 5L nasal canula, complaining of anxiety and pain. Unable to give patient pain medication until 1909 per PRN schedule. MD acknowledged and ordered ativan and to increase o2.

## 2023-01-20 NOTE — Assessment & Plan Note (Addendum)
Secondary to CHF from fluid resuscitation and blood given.  Resolved.  Now on room air

## 2023-01-20 NOTE — Progress Notes (Signed)
GI Inpatient Follow-up Note  Patient Identification: Ethan Wells is a 52 y.o. male seen in follow-up for symptomatic anemia and rectal bleeding.    Subjective: Denies any abdominal pain. Mild nausea with coughing but denies vomiting. Continues to feel short of breath. No GI C/o. Discussed w/ RN - had 2 brown BM overnight without evidence of blood in stool. Has been able to remain off vasopressors overnight.   Scheduled Inpatient Medications:   Chlorhexidine Gluconate Cloth  6 each Topical Daily   docusate sodium  100 mg Oral BID   folic acid  1 mg Oral Daily   furosemide  40 mg Intravenous BID   midodrine  10 mg Oral TID WC   multivitamin with minerals  1 tablet Oral Daily   nicotine  14 mg Transdermal Daily   pantoprazole (PROTONIX) IV  40 mg Intravenous Q12H   polyethylene glycol  17 g Oral Daily   sodium chloride flush  10-40 mL Intracatheter Q12H   sodium phosphate  2 enema Rectal UD   thiamine (VITAMIN B1) injection  100 mg Intravenous Daily   Or   thiamine  100 mg Oral Daily    Continuous Inpatient Infusions:    sodium chloride Stopped (01/19/23 0234)   sodium chloride     sodium chloride 20 mL/hr at 01/20/23 0533   cefTRIAXone (ROCEPHIN)  IV 200 mL/hr at 40/97/35 3299   folic acid 1 mg in sodium chloride 0.9 % 50 mL IVPB     octreotide (SANDOSTATIN) 500 mcg in sodium chloride 0.9 % 250 mL (2 mcg/mL) infusion 50 mcg/hr (01/20/23 0533)    PRN Inpatient Medications:  acetaminophen, LORazepam, morphine injection, sodium chloride flush  Review of Systems: Constitutional: Weight is stable.  Eyes: No changes in vision. ENT: No oral lesions, sore throat.  GI: see HPI.  Heme/Lymph: No easy bruising.  CV: No chest pain.  GU: No hematuria.  Integumentary: No rashes.  Neuro: No headaches.  Psych: No depression/anxiety.  Endocrine: No heat/cold intolerance.  Allergic/Immunologic: No urticaria.  Resp: No cough, SOB.  Musculoskeletal: No joint swelling.     Physical Examination: BP 131/71   Pulse 95   Temp 97.8 F (36.6 C) (Oral)   Resp (!) 25   Ht '5\' 5"'$  (1.651 m)   Wt 104.2 kg   SpO2 92%   BMI 38.23 kg/m  Gen: NAD, obese, ill appearing HEENT: PEERLA, EOMI, Neck: supple, no JVD or thyromegaly Chest: coarse sounds. O2 sat 92% on 5L CV: RRR, no m/g/c/r Abd: soft, NT, ND, +BS in all four quadrants; no HSM, guarding, ridigity, or rebound tenderness Ext: 1-2+ bilateral lower extremity edema Skin: no rash or lesions noted Lymph: no LAD  Data: Lab Results  Component Value Date   WBC 8.5 01/20/2023   HGB 8.6 (L) 01/20/2023   HCT 27.5 (L) 01/20/2023   MCV 89.0 01/20/2023   PLT 146 (L) 01/20/2023   Recent Labs  Lab 01/18/23 1441 01/19/23 0422 01/20/23 0328  HGB 8.8* 8.7* 8.6*   Lab Results  Component Value Date   NA 133 (L) 01/20/2023   K 3.7 01/20/2023   CL 102 01/20/2023   CO2 24 01/20/2023   BUN 8 01/20/2023   CREATININE 0.71 01/20/2023   Lab Results  Component Value Date   ALT 32 01/19/2023   AST 103 (H) 01/19/2023   ALKPHOS 225 (H) 01/19/2023   BILITOT 4.1 (H) 01/19/2023   Recent Labs  Lab 01/17/23 0043 01/18/23 0204  APTT 37*  --  INR 1.3* 1.3*    CT angio 01/17/23-- IMPRESSION: VASCULAR Findings suspicious for small active bleed in the rectum. Severe stenosis at the origin of the celiac axis is redemonstrated. Patent stent at the origin of the SMA.   NON-VASCULAR Wall thickening about the sigmoid colon/rectum as well as the transverse colon favored due to colitis however congestive colopathy could appear similarly. Duodenal wall thickening suggesting duodenitis. Cirrhosis and borderline splenomegaly compatible with portal hypertension.   Assessment/Plan: Ethan Wells is a 52 y.o. male with PMMH of CAD s/p anterior STEMI and PCI 2012, hx of NSTEMI, chronic pain disorder, hx of mesenteric ischemic s/p stenting, depression, anxiety, GERD, HTN, HLD, migraines, active polysubstance abuse, and hx of  hemorrhoidal bleeding s/p hemorrhoidectomy 10/2022 admitted on 01/16/23 for rectal bleeding and anemia   Rectal bleeding/Anemia/Melena/Dark emesis - broad ddx- H&H stable over past 48 hrs with no further evidence of bleeding. Initially EGD/flex sig planned has had to be postponed past 3 days due to ongoing respiratory issue. Given patients worsening SOB requiring 5L (92% sat on 5L), Bnp elevation will defer inpatient endoscopic evaluation at this time and follow up when patient more stable from cardiopulm standpoint. Feel patient is higher risk for sedated procedures currently and has not had clinic evidence of significant bleeding. Hgb has been stable since received transfusion at admission.   Hemorrhoids - have been source of bleeding in the past; s/p hemorrhoidectomy 10/28/22. General surgery did not recommend intervention.   Hx of mesenteric ischemia - Left sided colopathy vs colitis- ddx includes relating to cirrhosis or colonic ischemia given hypotension episodes and BRBPR   Cirrhosis - likely 2/2 alcohol/substance dependence, MELD 3.0 (22)   Hx of polysubstance abuse - UDS now negative for cocaine    Chronic pain disorder  CHF - primary team managing.     Recommendations: -Hold off on EGD/flex sig until clinically more stable (likely as outpatient if Hgb stable) -Will place back on full liquid diet given procedures postponed -Continue protonix & ceftriaxone. Will complete 72 hrs of octreotide today. -Monitor H&H; transfusion and resuscitation per primary team. Ethan Wells HGB between 7-9 for transfusion purposes -Monitor for gib -Ciwa protocol -Supportive care including anti emetics and pain control as per primary team -Suspect lfts slightly worsened due to his etoh use hypotension. Steroids not indicated given active etoh use. Repeat LFTs and INR in AM    Please call with questions or concerns.  Case discussed w/ Ethan Wells.   Ethan Blazer, PA-C [place]

## 2023-01-20 NOTE — Progress Notes (Signed)
[facility] Progress Note  [name]    IDP:[id number]  DOA: 01/16/2023    Date of Service: the patient was seen and examined on 01/20/2023  Brief hospital course: 52 year old male with past medical history of morbid obesity, CAD, polysubstance abuse, mesenteric ischemia status post stent and history of recent hemorrhoidal bleeding status post hemorrhoidectomy which was complicated by recurrent bleeding requiring repeat visit to the OR and non-STEMI.  At that time, patient was transfused 1 unit packed red blood cell transfusion.  Patient presented back to the emergency room on the night of 2/1 with complaints of lower extremity swelling and dyspnea on exertion.  Patient found to have acute kidney injury, hypotension with systolic blood pressure in the 80s tachypnea and hemoglobin of 6.3.  Vascular imaging noted active rectal bleeding and patient admitted for hemorrhagic shock with secondary blood loss anemia.  Also felt to have high-output heart failure.  Transfused 2 units packed red blood cells plus blood on IV fluids.  Patient also had elevated troponins which peaked as high as the 400s.  General surgery, gastroenterology and cardiology consulted.   Assessment and Plan: * Hemorrhagic shock (HCC)-resolved as of 01/18/2023 Secondary to acute on chronic blood loss anemia/symptomatic anemia Hematochezia, suspect secondary to hemorrhoidal bleeding History of hemorrhoidectomy November 2023 Patient has a history of bleeding hemorrhoids requiring hemorrhoidectomy in November 4431 complicated by rebleed and now reports bleeding with each BM CTA showing "Findings suspicious for small active bleed in the rectum".  Treat with IV fluids and blood transfusions.  General surgery and GI following.  Off of pressor support.  Hemoglobin relatively stable, unchanged in last 12 hours.  With normal urine drug screen now, GI plans to take patient tentatively for EGD and colonoscopy  tomorrow.   Acute blood loss anemia Secondary to hemorrhoidal bleeding.  An ongoing problem.  General surgery recommending no surgical intervention at this time.  Gastroenterology consulted recommending blood transfusion keeping hemoglobin above 7-8.  Avoid NSAIDs.  Plan is for EGD/colonoscopy tentatively tomorrow  Acute on chronic heart failure with preserved ejection fraction (HFpEF) ([place]) Suspect high-output heart failure from ongoing blood loss.,  Also with drug use.  Echocardiogram results noted ejection fraction 50 to 55% with indeterminate diastolic function.  Diuresing as blood pressure allows.  Net neutral now although has diuresed Over 5 L since hospitalization.  Demand ischemia Appreciate cardiology help.  Elevated troponins felt to be less likely due to acute non-STEMI more likely due to demand mismatch from blood loss anemia.  No beta-blocker for cocaine use.  Aspirin on hold due to anemia.  Continue to monitor.  Acute kidney injury (HCC)-resolved as of 01/18/2023 Secondary to blood loss anemia.  Improving with IV fluids and blood transfusion  Essential hypertension Blood pressure medications on hold due to hypotension from blood loss.  Off of pressor support, have added midodrine.  Cocaine dependence with cocaine-induced disorder ([place]) Will counsel.  Drug screen negative.  Able to proceed with procedures  Alcohol use disorder CIWA withdrawal protocol  Tobacco abuse Will counsel to quit  Morbid obesity ([place]) Meets criteria with BMI greater than 35 and comorbidities of CAD, CHF  Mesenteric artery stenosis (HCC) S/p stent 10/2022  Cocaine abuse ([place]) UDS positive for cocaine       Body mass index is 38.23 kg/m.        Consultants: General surgery Gastroenterology Critical care Cardiology  Procedures: Echocardiogram noting ejection fraction on low end of normal at 50 to 55%, indeterminate diastolic function  Status post multiple units packed red blood  cells Planned EGD and colonoscopy  Antimicrobials: None  Code Status: Full code   Subjective:   Objective:  Vitals:   01/20/23 1000 01/20/23 1100  BP: 130/82 115/69  Pulse: 92 97  Resp: (!) 24 (!) 30  Temp:    SpO2: 95% 96%    Intake/Output Summary (Last 24 hours) at 01/20/2023 1130 Last data filed at 01/20/2023 1119 Gross per 24 hour  Intake 1671.48 ml  Output 4500 ml  Net -2828.52 ml    Filed Weights   01/16/23 2305 01/17/23 0430 01/20/23 0130  Weight: 91.2 kg 101.7 kg 104.2 kg   Body mass index is 38.23 kg/m.  Exam:  General: Uncomfortable, irritable HEENT: Normocephalic, atraumatic, mucous membranes slightly dry Cardiovascular: Irregular rhythm, rate controlled Respiratory: Decreased breath sounds throughout secondary to body habitus Abdomen: Soft, obese, nontender, positive bowel sounds Musculoskeletal: No clubbing or cyanosis, trace pitting edema Skin: No skin breaks, tears or lesions Psychiatry: Appropriate, but groggy Neurology: No focal deficits  Data Reviewed: BNP at 2500.  Hemoglobin stable at 8.6  Disposition:  Status is: Inpatient Remains inpatient appropriate because:  -Improvement in hypoxia -Full diuresis    Anticipated discharge date: 2/8  Family Communication: Will call family DVT Prophylaxis: SCDs Start: 01/17/23 0414    Author: Annita Brod ,MD 01/20/2023 11:30 AM  To reach On-call, see care teams to locate the attending and reach out via [email or web address]. Between 7PM-7AM, please contact night-coverage If you still have difficulty reaching the attending provider, please page the [facility] (Director on Call) for [facility] on amion for assistance.

## 2023-01-20 NOTE — Consult Note (Signed)
PHARMACY CONSULT NOTE - ELECTROLYTES  Pharmacy Consult for Electrolyte Monitoring and Replacement   Recent Labs: Potassium (mmol/L)  Date Value  01/20/2023 3.7  09/06/2013 3.5   Magnesium (mg/dL)  Date Value  01/20/2023 1.8  03/09/2013 2.2   Calcium (mg/dL)  Date Value  01/20/2023 8.0 (L)   Calcium, Total (mg/dL)  Date Value  09/06/2013 8.9   Albumin (g/dL)  Date Value  01/19/2023 2.7 (L)  09/06/2013 4.0   Phosphorus (mg/dL)  Date Value  01/20/2023 2.6   Sodium (mmol/L)  Date Value  01/20/2023 133 (L)  09/06/2013 134 (L)   Corrected Ca: 8.2 mg/dL  Assessment  Ethan Wells is a 52 y.o. male presenting with hemorrhagic shock and acute on HFpEF. PMH significant for PSA (marijuana, cocaine), CAD (hx NSTEMI, STEMI), anemia 2/2 hemorrhoidal bleeding, AUD, mesentery ischemia s/p stent. Pharmacy has been consulted to monitor and replace electrolytes.   diuretics: furosemide 40 mg IV BID  Goal of Therapy: Electrolytes WNL  Plan:  40 mEq oral KCl x 1 2 grams IV magnesium sulfate x 1 Check electrolytes with AM labs  Thank you for allowing pharmacy to be a part of this patient's care.  Dallie Piles, PharmD Clinical Pharmacist 01/20/2023 7:12 AM

## 2023-01-21 DIAGNOSIS — R578 Other shock: Secondary | ICD-10-CM | POA: Diagnosis not present

## 2023-01-21 LAB — CBC WITH DIFFERENTIAL/PLATELET
Abs Immature Granulocytes: 0.04 10*3/uL (ref 0.00–0.07)
Basophils Absolute: 0.1 10*3/uL (ref 0.0–0.1)
Basophils Relative: 1 %
Eosinophils Absolute: 0.1 10*3/uL (ref 0.0–0.5)
Eosinophils Relative: 2 %
HCT: 28.6 % — ABNORMAL LOW (ref 39.0–52.0)
Hemoglobin: 9 g/dL — ABNORMAL LOW (ref 13.0–17.0)
Immature Granulocytes: 1 %
Lymphocytes Relative: 14 %
Lymphs Abs: 1.1 10*3/uL (ref 0.7–4.0)
MCH: 28.4 pg (ref 26.0–34.0)
MCHC: 31.5 g/dL (ref 30.0–36.0)
MCV: 90.2 fL (ref 80.0–100.0)
Monocytes Absolute: 0.9 10*3/uL (ref 0.1–1.0)
Monocytes Relative: 11 %
Neutro Abs: 5.7 10*3/uL (ref 1.7–7.7)
Neutrophils Relative %: 71 %
Platelets: 155 10*3/uL (ref 150–400)
RBC: 3.17 MIL/uL — ABNORMAL LOW (ref 4.22–5.81)
RDW: 17.3 % — ABNORMAL HIGH (ref 11.5–15.5)
WBC: 7.9 10*3/uL (ref 4.0–10.5)
nRBC: 0 % (ref 0.0–0.2)

## 2023-01-21 LAB — TYPE AND SCREEN
ABO/RH(D): A POS
Antibody Screen: NEGATIVE
Unit division: 0
Unit division: 0
Unit division: 0
Unit division: 0
Unit division: 0
Unit division: 0

## 2023-01-21 LAB — BASIC METABOLIC PANEL
Anion gap: 8 (ref 5–15)
BUN: 7 mg/dL (ref 6–20)
CO2: 26 mmol/L (ref 22–32)
Calcium: 8.2 mg/dL — ABNORMAL LOW (ref 8.9–10.3)
Chloride: 101 mmol/L (ref 98–111)
Creatinine, Ser: 0.77 mg/dL (ref 0.61–1.24)
GFR, Estimated: >60 mL/min (ref >60)
Glucose, Bld: 120 mg/dL — ABNORMAL HIGH (ref 70–99)
Potassium: 3.7 mmol/L (ref 3.5–5.1)
Sodium: 135 mmol/L (ref 135–145)

## 2023-01-21 LAB — BPAM RBC
Blood Product Expiration Date: 202402282359
Blood Product Expiration Date: 202402282359
Blood Product Expiration Date: 202402282359
Blood Product Expiration Date: 202402282359
Blood Product Expiration Date: 202402282359
Blood Product Expiration Date: 202402282359
ISSUE DATE / TIME: 202402020327
ISSUE DATE / TIME: 202402020802
ISSUE DATE / TIME: 202402021247
ISSUE DATE / TIME: 202402021546
Unit Type and Rh: 6200
Unit Type and Rh: 6200
Unit Type and Rh: 6200
Unit Type and Rh: 6200
Unit Type and Rh: 6200
Unit Type and Rh: 6200

## 2023-01-21 LAB — PREPARE RBC (CROSSMATCH)

## 2023-01-21 LAB — MAGNESIUM: Magnesium: 2.1 mg/dL (ref 1.7–2.4)

## 2023-01-21 MED ORDER — FOLIC ACID 1 MG PO TABS
1.0000 mg | ORAL_TABLET | Freq: Every day | ORAL | Status: DC
  Administered 2023-01-21 – 2023-01-31 (×11): 1 mg via ORAL
  Filled 2023-01-21 (×11): qty 1

## 2023-01-21 MED ORDER — SODIUM CHLORIDE 0.9 % IV SOLN
2.0000 g | INTRAVENOUS | Status: AC
  Administered 2023-01-22 – 2023-01-23 (×2): 2 g via INTRAVENOUS
  Filled 2023-01-21: qty 2
  Filled 2023-01-21: qty 20

## 2023-01-21 MED ORDER — SODIUM CHLORIDE 0.9 % IV SOLN: 1.0000 mg | Freq: Every day | INTRAVENOUS | Status: DC

## 2023-01-21 MED ORDER — POTASSIUM CHLORIDE CRYS ER 20 MEQ PO TBCR
40.0000 meq | EXTENDED_RELEASE_TABLET | Freq: Once | ORAL | Status: AC
  Administered 2023-01-21: 40 meq via ORAL
  Filled 2023-01-21: qty 2

## 2023-01-21 NOTE — Progress Notes (Signed)
There was a cigarette smoke smell in the patient's room upon entering. He denies smoking at this time. O2 saturation has dropped to 80%. He has increased WOB. Patient had a black and grey bag in his possession, I asked him for it, found his cigarettes and lighter. Explained to the patient the dangers of smoking in the hospital and that we would need to take the cigarettes and lighter and place them with security. When security came up to take the belongings down, patient was crying and asked to just have them thrown away. Both cigarettes and lighter were thrown away.

## 2023-01-21 NOTE — Consult Note (Signed)
PHARMACY CONSULT NOTE - ELECTROLYTES  Pharmacy Consult for Electrolyte Monitoring and Replacement   Recent Labs: Potassium (mmol/L)  Date Value  01/21/2023 3.7  09/06/2013 3.5   Magnesium (mg/dL)  Date Value  01/21/2023 2.1  03/09/2013 2.2   Calcium (mg/dL)  Date Value  01/21/2023 8.2 (L)   Calcium, Total (mg/dL)  Date Value  09/06/2013 8.9   Albumin (g/dL)  Date Value  01/19/2023 2.7 (L)  09/06/2013 4.0   Phosphorus (mg/dL)  Date Value  01/20/2023 2.6   Sodium (mmol/L)  Date Value  01/21/2023 135  09/06/2013 134 (L)   Corrected Ca: 8.2 mg/dL  Assessment  Ethan Wells is a 52 y.o. male presenting with hemorrhagic shock and acute on HFpEF. PMH significant for PSA (marijuana, cocaine), CAD (hx NSTEMI, STEMI), anemia 2/2 hemorrhoidal bleeding, AUD, mesentery ischemia s/p stent. Pharmacy has been consulted to monitor and replace electrolytes.   diuretics: furosemide 40 mg IV BID  Goal of Therapy: Electrolytes WNL  Plan:  40 mEq oral KCl x 1 Check electrolytes with AM labs  Thank you for allowing pharmacy to be a part of this patient's care.  Dallie Piles, PharmD Clinical Pharmacist 01/21/2023 12:25 PM

## 2023-01-21 NOTE — Progress Notes (Signed)
[facility] Progress Note  [name]    UEA:[id number]  DOA: 01/16/2023    Date of Service: the patient was seen and examined on 01/21/2023  Brief hospital course: 52 year old male with past medical history of morbid obesity, CAD, polysubstance abuse, mesenteric ischemia status post stent and history of recent hemorrhoidal bleeding status post hemorrhoidectomy which was complicated by recurrent bleeding requiring repeat visit to the OR and non-STEMI.  At that time, patient was transfused 1 unit packed red blood cell transfusion.   Patient presented back to the emergency room on the night of 2/1 with complaints of lower extremity swelling and dyspnea on exertion.  Patient found to have acute kidney injury, hypotension with systolic blood pressure in the 80s tachypnea and hemoglobin of 6.3.  Vascular imaging noted active rectal bleeding and patient admitted for hemorrhagic shock with secondary blood loss anemia.  Also felt to have high-output heart failure.  Transfused 2 units packed red blood cells plus blood on IV fluids.  Patient also had elevated troponins which peaked as high as the 400s.  General surgery, gastroenterology and cardiology consulted.   Assessment and Plan: * Hemorrhagic shock (HCC)-resolved as of 01/18/2023 Secondary to acute on chronic blood loss anemia/symptomatic anemia Hematochezia, suspect secondary to hemorrhoidal bleeding History of hemorrhoidectomy November 2023 Patient has a history of bleeding hemorrhoids requiring hemorrhoidectomy in November 4782 complicated by rebleed and now reports bleeding with each BM CTA showing "Findings suspicious for small active bleed in the rectum".  Treat with IV fluids and blood transfusions.  General surgery and GI following.  Off of pressor support.  Hemoglobin stable. Gen surg declined intervention, advises monitoring. GI initially planned to scope, now no plan for that inpatient. Will continue bowel regimen and  monitor. Converting ppi to oral per GI recs.  Acute blood loss anemia Secondary to hemorrhoidal bleeding.  An ongoing problem.  General surgery recommending no surgical intervention at this time.  Gastroenterology consulted recommending blood transfusion keeping hemoglobin above 7-8.  Avoid NSAIDs.  Endoluminal eval now deferred by GI  Acute on chronic heart failure with preserved ejection fraction (HFpEF) ([place]) Suspect high-output heart failure from ongoing blood loss.,  Also with drug use.  Echocardiogram results noted ejection fraction 50 to 55% with indeterminate diastolic function.  Diuresing as blood pressure allows.  See below.  Have increased Lasix frequency and added IV albumin. Out 4 liters yesterday and kidney function stable so will continue IV lasix  Acute respiratory failure with hypoxia (HCC) Secondary to CHF from fluid resuscitation and blood given.  Was getting Lasix although limited initially due to hypotension.  Patient had been at 2 to 3 L and then increased to 5 L on morning of 2/5.  Repeat BNP on morning of 2/5 increased from 1500 to 2500.  Today satting low 90s off oxygen, will continue to monitor  Demand ischemia Appreciate cardiology help.  Elevated troponins felt to be less likely due to acute non-STEMI more likely due to demand mismatch from blood loss anemia.  No beta-blocker for cocaine use.  Aspirin on hold due to anemia.  Continue to monitor.  Acute kidney injury (HCC)-resolved as of 01/18/2023 Secondary to blood loss anemia.   Cirrhosis, alcoholic Continue ceftriaxone to complete a course for sbp ppx  Essential hypertension Blood pressure medications on hold due to hypotension from blood loss.  Off of pressor support, have added midodrine.  Cocaine dependence with cocaine-induced disorder ([place]) Will counsel.  Drug screen negative.  Able to proceed  with procedures  Alcohol use disorder CIWA withdrawal protocol  Tobacco abuse Will counsel to quit  Morbid  obesity ([place]) Meets criteria with BMI greater than 35 and comorbidities of CAD, CHF  Mesenteric artery stenosis (HCC) S/p stent 10/2022  Cocaine abuse ([place]) UDS positive for cocaine       Body mass index is 38.23 kg/m.        Consultants: General surgery Gastroenterology Critical care Cardiology  Procedures: Echocardiogram noting ejection fraction on low end of normal at 50 to 55%, indeterminate diastolic function Status post multiple units packed red blood cells   Antimicrobials: None  Code Status: Full code   Subjective:  Complains of lower extremity swelling  Objective:  Vitals:   01/21/23 1100 01/21/23 1200  BP: 103/68 (!) 112/59  Pulse: 90 83  Resp: 16 13  Temp:    SpO2: 94% 91%    Intake/Output Summary (Last 24 hours) at 01/21/2023 1208 Last data filed at 01/21/2023 0200 Gross per 24 hour  Intake 369.58 ml  Output 1725 ml  Net -1355.42 ml   Filed Weights   01/16/23 2305 01/17/23 0430 01/20/23 0130  Weight: 91.2 kg 101.7 kg 104.2 kg   Body mass index is 38.23 kg/m.  Exam:  General: Uncomfortable, NAD HEENT: Normocephalic, atraumatic, mucous membranes slightly dry Cardiovascular: Irregular rhythm, rate controlled Respiratory: normal wob, rales at bases Abdomen: Soft, obese, nontender, positive bowel sounds Musculoskeletal: No clubbing or cyanosis, trace pitting edema Skin: No skin breaks, tears or lesions Psychiatry: Appropriate, but groggy Neurology: No focal deficits   Disposition:  Status is: Inpatient Remains inpatient appropriate because:  ongoing IV diuresis     Family Communication: none @ bedside DVT Prophylaxis: SCDs Start: 01/17/23 0414    Author: Desma Maxim ,MD 01/21/2023 12:08 PM  To reach On-call, see care teams to locate the attending and reach out via [email or web address]. Between 7PM-7AM, please contact night-coverage If you still have difficulty reaching the attending provider, please page the [facility] (Director on  Call) for [facility] on amion for assistance.

## 2023-01-21 NOTE — Evaluation (Signed)
Physical Therapy Evaluation Patient Details Name: Ethan Wells MRN: [id number] DOB: Jan 11, 1971 Today's Date: 01/21/2023  History of Present Illness  52 year old male with past medical history of morbid obesity, CAD, polysubstance abuse, mesenteric ischemia status post stent and history of recent hemorrhoidal bleeding status post hemorrhoidectomy which was complicated by recurrent bleeding requiring repeat visit to the OR and non-STEMI.  At that time, patient was transfused 1 unit packed red blood cell transfusion.     Patient presented back to the emergency room on the night of 2/1 with complaints of lower extremity swelling and dyspnea on exertion.  Patient found to have acute kidney injury, hypotension with systolic blood pressure in the 80s tachypnea and hemoglobin of 6.3.  Vascular imaging noted active rectal bleeding and patient admitted for hemorrhagic shock with secondary blood loss anemia.  Also felt to have high-output heart failure.  Transfused 2 units packed red blood cells plus blood on IV fluids.  Patient also had elevated troponins which peaked as high as the 400s.  General surgery, gastroenterology and cardiology consulted.   Clinical Impression  Pt admitted with above diagnosis. Pt received sitting upright in bed agreeable to PT services. Reports living currently in group home/boarding house set up on main level. He is typically mod-I with his [facility] for gait and performs his ADL's independently but reports he struggles and needs help with ADL's but does not have the help he needs stating he is just doing the best he can.   To date, he requires bed features, increased time to transfer to EOB. He is able to stand at bedside using [facility] and ambulate around bed to recliner. He frequently relies on bed rails while ambulating to maintain balance and stay upright as he has flexed posture, very short steps when attempting gait without BUE support. Pt on 5L/min throughout mobility maintaining sats  > 90% at rest and with mobility but does have some SOME post short gait bout requiring ~1-2 minute recovery then standing and step pivoting at supervision level back to bed. Educated pt on benefits of additional rehab as pt has had multiple hospitalizations and general decline in function with no support. Pt seems resistant to idea of additional therapy but does think it may be helpful for him. PT currently rec STR for pt but anticipate this is not a very likely option due to polysubstance abuse. If STR is not feasible rec HH PT at discharge to address deconditioning, balance, and gait to reduce falls risk and optimize return to PLOF. Pt currently with functional limitations due to the deficits listed below (see PT Problem List). Pt will benefit from skilled PT to increase their independence and safety with mobility to allow discharge to the venue listed below.     Recommendations for follow up therapy are one component of a multi-disciplinary discharge planning process, led by the attending physician.  Recommendations may be updated based on patient status, additional functional criteria and insurance authorization.  Follow Up Recommendations Skilled nursing-short term rehab (<3 hours/day) Can patient physically be transported by private vehicle: Yes    Assistance Recommended at Discharge Frequent or constant Supervision/Assistance  Patient can return home with the following  A little help with walking and/or transfers;A little help with bathing/dressing/bathroom;Assist for transportation;Assistance with cooking/housework;Help with stairs or ramp for entrance    Equipment Recommendations Rolling walker (2 wheels)  Recommendations for Other Services       Functional Status Assessment Patient has had a recent decline in their functional status  and demonstrates the ability to make significant improvements in function in a reasonable and predictable amount of time.     Precautions / Restrictions  Precautions Precautions: Fall Restrictions Weight Bearing Restrictions: No      Mobility  Bed Mobility Overal bed mobility: Needs Assistance Bed Mobility: Supine to Sit, Sit to Supine     Supine to sit: Supervision Sit to supine: Supervision     Patient Response: Cooperative  Transfers Overall transfer level: Needs assistance Equipment used: Straight cane Transfers: Sit to/from Stand, Bed to chair/wheelchair/BSC Sit to Stand: Min guard   Step pivot transfers: Supervision       General transfer comment: from recliner to bedside    Ambulation/Gait Ambulation/Gait assistance: Min guard Gait Distance (Feet): 25 Feet Assistive device: Rolling walker (2 wheels) Gait Pattern/deviations: Step-to pattern, Trunk flexed, Narrow base of support       General Gait Details: Frequent UE support on bedside surface to maintain upright gait and balance despite SPC use in contralateral UE's  Stairs            Wheelchair Mobility    Modified Rankin (Stroke Patients Only)       Balance Overall balance assessment: Needs assistance Sitting-balance support: Bilateral upper extremity supported, Feet supported Sitting balance-Leahy Scale: Fair     Standing balance support: Bilateral upper extremity supported, Reliant on assistive device for balance Standing balance-Leahy Scale: Poor                               Pertinent Vitals/Pain Pain Assessment Pain Assessment: Faces Faces Pain Scale: Hurts a little bit Pain Location: Buttocks/LE's Pain Descriptors / Indicators: Grimacing Pain Intervention(s): Limited activity within patient's tolerance, Repositioned    Home Living Family/patient expects to be discharged to:: Shelter/Homeless Living Arrangements: Alone   Type of Home: Group Home Home Access: Stairs to enter Entrance Stairs-Rails: None Entrance Stairs-Number of Steps: 2   Home Layout: Two level;Able to live on main level with  bedroom/bathroom Home Equipment: Kasandra Knudsen - single point      Prior Function Prior Level of Function : Independent/Modified Independent             Mobility Comments: Uses SPC for mobility. ADLs Comments: Reports doing "the best I can" with his ADL's. Reports he needs assist but doesn't have help.     Hand Dominance        Extremity/Trunk Assessment   Upper Extremity Assessment Upper Extremity Assessment: Defer to OT evaluation    Lower Extremity Assessment Lower Extremity Assessment: Generalized weakness    Cervical / Trunk Assessment Cervical / Trunk Assessment: Normal  Communication   Communication: No difficulties  Cognition Arousal/Alertness: Awake/alert Behavior During Therapy: WFL for tasks assessed/performed Overall Cognitive Status: Within Functional Limits for tasks assessed                                          General Comments General comments (skin integrity, edema, etc.): SPO2 > 90% on 5 L/min    Exercises Other Exercises Other Exercises: Role of PT in acute setting, d/c recs   Assessment/Plan    PT Assessment Patient needs continued PT services  PT Problem List Decreased strength;Decreased activity tolerance;Decreased balance;Decreased mobility;Cardiopulmonary status limiting activity       PT Treatment Interventions DME instruction;Balance training;Gait training;Neuromuscular re-education;Stair training;Functional mobility training;Patient/family education;Therapeutic activities;Therapeutic  exercise    PT Goals (Current goals can be found in the Care Plan section)  Acute Rehab PT Goals Patient Stated Goal: improve breathing and mobility PT Goal Formulation: With patient Time For Goal Achievement: 02/04/23 Potential to Achieve Goals: Fair    Frequency Min 2X/week     Co-evaluation               AM-PAC PT "6 Clicks" Mobility  Outcome Measure Help needed turning from your back to your side while in a flat bed  without using bedrails?: A Little Help needed moving from lying on your back to sitting on the side of a flat bed without using bedrails?: A Little Help needed moving to and from a bed to a chair (including a wheelchair)?: A Lot Help needed standing up from a chair using your arms (e.g., wheelchair or bedside chair)?: A Little Help needed to walk in hospital room?: A Lot Help needed climbing 3-5 steps with a railing? : A Lot 6 Click Score: 15    End of Session Equipment Utilized During Treatment: Gait belt;Oxygen Activity Tolerance: Patient tolerated treatment well Patient left: in bed;with call bell/phone within reach;with bed alarm set Nurse Communication: Mobility status PT Visit Diagnosis: Unsteadiness on feet (R26.81);Other abnormalities of gait and mobility (R26.89);Muscle weakness (generalized) (M62.81)    Time: 8882-8003 PT Time Calculation (min) (ACUTE ONLY): 18 min   Charges:   PT Evaluation $PT Eval Moderate Complexity: [place] M. Fairly IV, PT, DPT Physical Therapist- [facility]  01/21/2023, 3:49 PM

## 2023-01-22 ENCOUNTER — Ambulatory Visit: Payer: Medicare HMO | Admitting: Cardiovascular Disease

## 2023-01-22 DIAGNOSIS — R578 Other shock: Secondary | ICD-10-CM | POA: Diagnosis not present

## 2023-01-22 DIAGNOSIS — I5033 Acute on chronic diastolic (congestive) heart failure: Secondary | ICD-10-CM | POA: Diagnosis not present

## 2023-01-22 DIAGNOSIS — D62 Acute posthemorrhagic anemia: Secondary | ICD-10-CM | POA: Diagnosis not present

## 2023-01-22 DIAGNOSIS — J9601 Acute respiratory failure with hypoxia: Secondary | ICD-10-CM | POA: Diagnosis not present

## 2023-01-22 LAB — BASIC METABOLIC PANEL
Anion gap: 5 (ref 5–15)
BUN: 7 mg/dL (ref 6–20)
CO2: 28 mmol/L (ref 22–32)
Calcium: 8.3 mg/dL — ABNORMAL LOW (ref 8.9–10.3)
Chloride: 103 mmol/L (ref 98–111)
Creatinine, Ser: 0.77 mg/dL (ref 0.61–1.24)
GFR, Estimated: >60 mL/min (ref >60)
Glucose, Bld: 94 mg/dL (ref 70–99)
Potassium: 3.7 mmol/L (ref 3.5–5.1)
Sodium: 136 mmol/L (ref 135–145)

## 2023-01-22 LAB — CULTURE, BLOOD (ROUTINE X 2)
Culture: NO GROWTH
Culture: NO GROWTH
Special Requests: ADEQUATE
Special Requests: ADEQUATE

## 2023-01-22 LAB — CBC WITH DIFFERENTIAL/PLATELET
Abs Immature Granulocytes: 0.02 10*3/uL (ref 0.00–0.07)
Basophils Absolute: 0.1 10*3/uL (ref 0.0–0.1)
Basophils Relative: 1 %
Eosinophils Absolute: 0.2 10*3/uL (ref 0.0–0.5)
Eosinophils Relative: 2 %
HCT: 30.3 % — ABNORMAL LOW (ref 39.0–52.0)
Hemoglobin: 9.7 g/dL — ABNORMAL LOW (ref 13.0–17.0)
Immature Granulocytes: 0 %
Lymphocytes Relative: 18 %
Lymphs Abs: 1.3 10*3/uL (ref 0.7–4.0)
MCH: 28.8 pg (ref 26.0–34.0)
MCHC: 32 g/dL (ref 30.0–36.0)
MCV: 89.9 fL (ref 80.0–100.0)
Monocytes Absolute: 0.9 10*3/uL (ref 0.1–1.0)
Monocytes Relative: 12 %
Neutro Abs: 4.9 10*3/uL (ref 1.7–7.7)
Neutrophils Relative %: 67 %
Platelets: 154 10*3/uL (ref 150–400)
RBC: 3.37 MIL/uL — ABNORMAL LOW (ref 4.22–5.81)
RDW: 17.9 % — ABNORMAL HIGH (ref 11.5–15.5)
WBC: 7.4 10*3/uL (ref 4.0–10.5)
nRBC: 0 % (ref 0.0–0.2)

## 2023-01-22 LAB — MAGNESIUM: Magnesium: 1.8 mg/dL (ref 1.7–2.4)

## 2023-01-22 LAB — GLUCOSE, CAPILLARY: Glucose-Capillary: 83 mg/dL (ref 70–99)

## 2023-01-22 MED ORDER — ONDANSETRON HCL 4 MG/2ML IJ SOLN
4.0000 mg | Freq: Four times a day (QID) | INTRAMUSCULAR | Status: DC | PRN
  Administered 2023-01-22: 4 mg via INTRAVENOUS
  Filled 2023-01-22: qty 2

## 2023-01-22 MED ORDER — MAGNESIUM SULFATE 2 GM/50ML IV SOLN
2.0000 g | Freq: Once | INTRAVENOUS | Status: AC
  Administered 2023-01-22: 2 g via INTRAVENOUS
  Filled 2023-01-22: qty 50

## 2023-01-22 MED ORDER — POTASSIUM CHLORIDE CRYS ER 20 MEQ PO TBCR
40.0000 meq | EXTENDED_RELEASE_TABLET | Freq: Once | ORAL | Status: AC
  Administered 2023-01-22: 40 meq via ORAL
  Filled 2023-01-22: qty 2

## 2023-01-22 MED ORDER — OXYCODONE HCL 5 MG PO TABS
5.0000 mg | ORAL_TABLET | ORAL | Status: DC | PRN
  Administered 2023-01-22 – 2023-01-23 (×5): 5 mg via ORAL
  Filled 2023-01-22 (×5): qty 1

## 2023-01-22 NOTE — Care Management Important Message (Signed)
Important Message  Patient Details  Name: Ethan Wells MRN: [id number] Date of Birth: 03-19-1971   Medicare Important Message Given:  Yes     [name] 01/22/2023, 10:49 AM

## 2023-01-22 NOTE — NC FL2 (Signed)
[place] LEVEL OF CARE FORM     IDENTIFICATION  Patient Name: Ethan Wells Birthdate: August 31, 1971 Sex: male Admission Date (Current Location): 01/16/2023  [facility] and [place] Number:  Engineering geologist and Address:         Provider Number: [id number]  Attending Physician Name and Address:  Sharen Hones, MD  Relative Name and Phone Number:       Current Level of Care: Hospital Recommended Level of Care: [place] Prior Approval Number:    Date Approved/Denied:   PASRR Number: Pending  Discharge Plan: SNF    Current Diagnoses: Patient Active Problem List   Diagnosis Date Noted   Hypophosphatemia 01/22/2023   Acute respiratory failure with hypoxia ([place]) 01/20/2023   Hypotension 01/17/2023   Anasarca 01/17/2023   Morbid obesity ([place]) 01/17/2023   Cocaine dependence with cocaine-induced disorder ([place]) 01/17/2023   Rectal bleeding 01/17/2023   Peripheral edema 01/17/2023   History of benzodiazepine use 01/17/2023   Chest pain 11/19/2022   Demand ischemia 11/05/2022   Acute on chronic heart failure with preserved ejection fraction (HFpEF) ([place]) 10/29/2022   Acute respiratory distress 10/29/2022   Elevated rheumatoid factor 10/28/2022   Hyperkalemia 10/25/2022   Hemorrhoids, internal, with bleeding 10/25/2022   Mesenteric artery stenosis (HCC) 10/23/2022   Iron deficiency anemia 10/23/2022   Hematochezia    Abnormal CT scan, colon    Colitis 09/30/2022   Prolonged QT interval 09/30/2022   Alcohol use disorder 08/19/2022   Acute on chronic blood loss anemia 05/29/2022   GI bleed 05/28/2022   Non-intractable vomiting    Epigastric pain    Polyp of ascending colon    Acute blood loss anemia 08/07/2021   Acute gastritis    Hyperbilirubinemia    Elevated troponin    Notalgia    History of ST elevation myocardial infarction (STEMI) 12/19/2020   Alcohol dependence ([place]) 12/19/2020   Depression with anxiety 10/05/2020    Esophageal reflux 10/05/2020   Headache 10/05/2020   Hyperlipidemia 10/05/2020   Hyponatremia 06/04/2020   Hypomagnesemia 51/70/0174   Chronic systolic CHF (congestive heart failure) ([place]) 06/04/2020   Leukocytosis 06/04/2020   Abdominal pain 06/04/2020   Hypokalemia 06/04/2020   Ischemic cardiomyopathy    Polysubstance abuse (HCC)    NSTEMI (non-ST elevated myocardial infarction) ([place]) 07/12/2019   Abdominal pain, RUQ 05/25/2018   Chest pain, mid sternal 11/17/2012   Cocaine abuse ([place])    Marijuana abuse    Tobacco abuse 02/26/2011   Essential hypertension 02/26/2011   CAD (coronary artery disease) 02/26/2011   Other specified forms of chronic ischemic heart disease 02/26/2011    Orientation RESPIRATION BLADDER Height & Weight     Self, Situation, Place, Time  O2 (5L) Incontinent Weight: 104.2 kg Height:  '5\' 5"'$  (165.1 cm)  BEHAVIORAL SYMPTOMS/MOOD NEUROLOGICAL BOWEL NUTRITION STATUS      Continent Diet (2 gram sodium)  AMBULATORY STATUS COMMUNICATION OF NEEDS Skin   Extensive Assist Verbally Other (Comment) (redness)                       Personal Care Assistance Level of Assistance              Functional Limitations Info             SPECIAL CARE FACTORS FREQUENCY  PT (By licensed PT), OT (By licensed OT)  Contractures Contractures Info: Not present    Additional Factors Info  Code Status, Allergies Code Status Info: full Allergies Info: Penicillins, Other           Current Medications (01/22/2023):  This is the current hospital active medication list Current Facility-Administered Medications  Medication Dose Route Frequency Provider Last Rate Last Admin   0.9 %  sodium chloride infusion  250 mL Intravenous Continuous Rust-Chester, Huel Cote, NP   Stopped at 01/19/23 0234   0.9 %  sodium chloride infusion   Intravenous Continuous Annamaria Helling, DO       0.9 %  sodium chloride infusion   Intravenous Continuous  Geanie Kenning, PA-C   Stopped at 01/21/23 1242   acetaminophen (TYLENOL) tablet 650 mg  650 mg Oral Q4H PRN Athena Masse, MD   650 mg at 01/17/23 0946   cefTRIAXone (ROCEPHIN) 2 g in sodium chloride 0.9 % 100 mL IVPB  2 g Intravenous Q24H Gwynne Edinger, MD   Stopped at 01/22/23 1242   Chlorhexidine Gluconate Cloth 2 % PADS 6 each  6 each Topical QHS Flora Lipps, MD   6 each at 01/21/23 2251   docusate sodium (COLACE) capsule 100 mg  100 mg Oral BID Teressa Lower, NP   100 mg at 73/22/02 5427   folic acid 1 mg in sodium chloride 0.9 % 50 mL IVPB  1 mg Intravenous Daily Renda Rolls, RPH       Or   folic acid (FOLVITE) tablet 1 mg  1 mg Oral Daily Renda Rolls, RPH   1 mg at 01/22/23 0858   furosemide (LASIX) injection 40 mg  40 mg Intravenous BID Annita Brod, MD   40 mg at 01/22/23 0857   guaiFENesin-dextromethorphan (ROBITUSSIN DM) 100-10 MG/5ML syrup 5 mL  5 mL Oral Q4H PRN Annita Brod, MD       midodrine (PROAMATINE) tablet 10 mg  10 mg Oral TID WC Annita Brod, MD   10 mg at 01/22/23 1207   multivitamin with minerals tablet 1 tablet  1 tablet Oral Daily Teressa Lower, NP   1 tablet at 01/22/23 0857   nicotine (NICODERM CQ - dosed in mg/24 hours) patch 14 mg  14 mg Transdermal Daily Judd Gaudier V, MD   14 mg at 01/22/23 0859   ondansetron (ZOFRAN) injection 4 mg  4 mg Intravenous Q6H PRN Sharen Hones, MD   4 mg at 01/22/23 1022   Oral care mouth rinse  15 mL Mouth Rinse PRN Annita Brod, MD       oxyCODONE (Oxy IR/ROXICODONE) immediate release tablet 5 mg  5 mg Oral Q4H PRN Sharen Hones, MD   5 mg at 01/22/23 1022   pantoprazole (PROTONIX) injection 40 mg  40 mg Intravenous Q12H Rust-Chester, Britton L, NP   40 mg at 01/22/23 0857   polyethylene glycol (MIRALAX / GLYCOLAX) packet 17 g  17 g Oral Daily Teressa Lower, NP   17 g at 01/17/23 0946   sodium chloride flush (NS) 0.9 % injection 10-40 mL  10-40 mL Intracatheter Q12H Annita Brod,  MD   10 mL at 01/22/23 0900   sodium chloride flush (NS) 0.9 % injection 10-40 mL  10-40 mL Intracatheter PRN Annita Brod, MD       sodium phosphate (FLEET) 7-19 GM/118ML enema 2 enema  2 enema Rectal UD Croley, Granville M, PA-C       thiamine (  VITAMIN B1) injection 100 mg  100 mg Intravenous Daily Rust-Chester, Britton L, NP   100 mg at 01/20/23 3818   Or   thiamine (VITAMIN B1) tablet 100 mg  100 mg Oral Daily Rust-Chester, Britton L, NP   100 mg at 01/22/23 4037     Discharge Medications: Please see discharge summary for a list of discharge medications.  Relevant Imaging Results:  Relevant Lab Results:   Additional Information ss [id number]  Beverly Sessions, RN

## 2023-01-22 NOTE — Progress Notes (Signed)
RE:  Ethan Wells Date of Birth:  [birth year] Date: 01/22/23     To Whom It May Concern:   Please be advised that the above-named patient will require a short-term nursing home stay - anticipated 30 days or less for rehabilitation and strengthening.  The plan is for return home

## 2023-01-22 NOTE — Progress Notes (Deleted)
No chief complaint on file.  History of Present Illness: 52 yo male with history of tobacco abuse, cocaine abuse, etoh abuse, CAD, ischemic cardiomyopathy here today for follow up. He was admitted to [facility] in 2012 with an anterior STEMI. HIs LAD was occluded. A bare metal stent was placed. EF of 25-30% by echo with akinesis of the anteroseptal wall and apex with f/u echo 7/12 with LVEF=45-50%.  He was lost to follow up in our office from 2014-2022. At his office visit in 2014, he c/o severe chest pains. Cardiac cath was arranged but was not completed as he did not show up for this procedure. He was admitted to [facility] in July 2020 and June 2021 with chest pain felt to be atypical and not cardiac related. Cocaine cessation advised. He was admitted to [facility] January 2022 with dyspnea, back pain. He did not have chest pain. Troponin was mildly elevated. Chest CTA negative for PE. UDS positive for cocaine and opiates. Echo with January 2022 with LVEF=55-60% and no valve disease. Our cardiology consult team saw him there and did not feel that his pain was cardiac in nature. He was advised to stop using cocaine an stop smoking tobacco. He has not been compliant with medications over the past 12 years.  He was admitted to [facility] in October 2023 with chest pain. Echo October 2023 with normal LV function and no valve disease. Nuclear stress test October 2023 without ischemia. He was admitted to [facility] in November 2023 with abdominal pain and found to have SM stenosis treated by vascular surgery with endovascular approach. Chest pain and respiratory failure while admitted. Cardiac cath 11/04/22 with chronic occlusion of the mid Circumflex  and proximal to mid RCA. Moderate LAD stenosis. Moderately severe ramus intermediate stenosis. Medical therapy recommended.   He is here today for follow up. The patient denies any chest pain, dyspnea, palpitations, lower extremity edema, orthopnea, PND, dizziness, near syncope  or syncope.    Primary Care Physician:  Nickola Major, NP  Past Medical History:  Diagnosis Date   CAD (coronary artery disease)    a. 01/2011 Anterior STEMI/Cath/PCI: LM nl, LAD 100d (3.5x64m Vision BMS placed), LCX 259mRI 50, RCA min irregs, EF 40% w/ apical, inferoapical HK; b. 10/2022 NSTSEMI/Cath: LM nl, LAD 5p/m, RI 85, LCX 10052mM1 mild dzs, OM3 100 fills via L->L collats from dLAD, RCA 80p, 100m64mDA fills via collats from LAD. EF 45-50%-->Med Rx.   Cardiac arrest - ventricular fibrillation    a. In setting of STEMI 01/2011   Chronic pain    Cocaine abuse (HCC)[place] Depression with anxiety    ETOH abuse    a. 6-12 beers/day   GERD (gastroesophageal reflux disease)    GI bleed    Hemorrhoids    Hyperlipidemia    Hypertension    Ischemic cardiomyopathy    a. 06/2011 Echo: EF 45-50%, No rwma; b. 10/2022 Echo: EF 55-60%, no rwma, nl RV fxn.   Marijuana abuse    Migraines    Tobacco abuse    a. 1/2 ppd x 26 yrs    Past Surgical History:  Procedure Laterality Date   COLONOSCOPY  08/09/2021   Procedure: COLONOSCOPY;  Surgeon: WohlLucilla Lame;  [facility];  Service: Endoscopy;;   CORONARY ANGIOPLASTY WITH STENT PLACEMENT     ESOPHAGOGASTRODUODENOSCOPY N/A 05/30/2022   Procedure: ESOPHAGOGASTRODUODENOSCOPY (EGD);  Surgeon: VangLin Landsman;  [facility];  Service: Gastroenterology;  Laterality: N/A;   ESOPHAGOGASTRODUODENOSCOPY (EGD) WITH PROPOFOL N/A 03/01/2021   Procedure: ESOPHAGOGASTRODUODENOSCOPY (EGD) WITH PROPOFOL;  Surgeon: Lin Landsman, MD;  Location: [place];  Service: Endoscopy;  Laterality: N/A;   ESOPHAGOGASTRODUODENOSCOPY (EGD) WITH PROPOFOL N/A 08/09/2021   Procedure: ESOPHAGOGASTRODUODENOSCOPY (EGD) WITH PROPOFOL;  Surgeon: Lucilla Lame, MD;  Location: [facility];  Service: Endoscopy;  Laterality: N/A;   EVALUATION UNDER ANESTHESIA WITH HEMORRHOIDECTOMY N/A 10/31/2022   Procedure: EXAM UNDER  ANESTHESIA WITH SUTURE LIGATION OF TWO BLEEDING PEDICLES;  Surgeon: Olean Ree, MD;  Location: [facility] ORS;  Service: General;  Laterality: N/A;   FLEXIBLE SIGMOIDOSCOPY N/A 10/02/2022   Procedure: FLEXIBLE SIGMOIDOSCOPY;  Surgeon: Ladene Artist, MD;  Location: [place];  Service: Gastroenterology;  Laterality: N/A;   LEFT HEART CATH AND CORONARY ANGIOGRAPHY N/A 11/04/2022   Procedure: LEFT HEART CATH AND CORONARY ANGIOGRAPHY;  Surgeon: Wellington Hampshire, MD;  Location: [place] CV LAB;  Service: Cardiovascular;  Laterality: N/A;   VISCERAL ANGIOGRAPHY N/A 10/24/2022   Procedure: VISCERAL ANGIOGRAPHY;  Surgeon: Algernon Huxley, MD;  Location: [place] CV LAB;  Service: Cardiovascular;  Laterality: N/A;    No current facility-administered medications for this visit.   No current outpatient medications on file.   Facility-Administered Medications Ordered in Other Visits  Medication Dose Route Frequency Provider Last Rate Last Admin   0.9 %  sodium chloride infusion  250 mL Intravenous Continuous Rust-Chester, Huel Cote, NP   Stopped at 01/19/23 0234   0.9 %  sodium chloride infusion   Intravenous Continuous Annamaria Helling, DO       0.9 %  sodium chloride infusion   Intravenous Continuous Geanie Kenning, PA-C 10 mL/hr at 01/21/23 0200 Infusion Verify at 01/21/23 0200   acetaminophen (TYLENOL) tablet 650 mg  650 mg Oral Q4H PRN Athena Masse, MD   650 mg at 01/17/23 0946   cefTRIAXone (ROCEPHIN) 2 g in sodium chloride 0.9 % 100 mL IVPB  2 g Intravenous Q24H Wouk, Ailene Rud, MD       Chlorhexidine Gluconate Cloth 2 % PADS 6 each  6 each Topical QHS Flora Lipps, MD   6 each at 01/21/23 2251   docusate sodium (COLACE) capsule 100 mg  100 mg Oral BID Teressa Lower, NP   100 mg at 03/50/09 3818   folic acid 1 mg in sodium chloride 0.9 % 50 mL IVPB  1 mg Intravenous Daily Renda Rolls, RPH       Or   folic acid (FOLVITE) tablet 1 mg  1 mg Oral Daily Renda Rolls,  RPH   1 mg at 01/22/23 0858   furosemide (LASIX) injection 40 mg  40 mg Intravenous BID Annita Brod, MD   40 mg at 01/22/23 0857   guaiFENesin-dextromethorphan (ROBITUSSIN DM) 100-10 MG/5ML syrup 5 mL  5 mL Oral Q4H PRN Annita Brod, MD       midodrine (PROAMATINE) tablet 10 mg  10 mg Oral TID WC Annita Brod, MD   10 mg at 01/22/23 2993   multivitamin with minerals tablet 1 tablet  1 tablet Oral Daily Teressa Lower, NP   1 tablet at 01/22/23 0857   nicotine (NICODERM CQ - dosed in mg/24 hours) patch 14 mg  14 mg Transdermal Daily Judd Gaudier V, MD   14 mg at 01/22/23 0859   ondansetron (ZOFRAN) injection 4 mg  4 mg Intravenous Q6H PRN Sharen Hones, MD   4 mg at  01/22/23 1022   Oral care mouth rinse  15 mL Mouth Rinse PRN Annita Brod, MD       oxyCODONE (Oxy IR/ROXICODONE) immediate release tablet 5 mg  5 mg Oral Q4H PRN Sharen Hones, MD   5 mg at 01/22/23 1022   pantoprazole (PROTONIX) injection 40 mg  40 mg Intravenous Q12H Rust-Chester, Huel Cote, NP   40 mg at 01/22/23 0857   polyethylene glycol (MIRALAX / GLYCOLAX) packet 17 g  17 g Oral Daily Teressa Lower, NP   17 g at 01/17/23 0946   sodium chloride flush (NS) 0.9 % injection 10-40 mL  10-40 mL Intracatheter Q12H Annita Brod, MD   10 mL at 01/22/23 0900   sodium chloride flush (NS) 0.9 % injection 10-40 mL  10-40 mL Intracatheter PRN Annita Brod, MD       sodium phosphate (FLEET) 7-19 GM/118ML enema 2 enema  2 enema Rectal UD Croley, Granville M, PA-C       thiamine (VITAMIN B1) injection 100 mg  100 mg Intravenous Daily Rust-Chester, Britton L, NP   100 mg at 01/20/23 3086   Or   thiamine (VITAMIN B1) tablet 100 mg  100 mg Oral Daily Rust-Chester, Britton L, NP   100 mg at 01/22/23 0859    Allergies  Allergen Reactions   Penicillins Hives    Tolerated Ceftriaxone 10/2022   Other Rash    chives    Social History   Socioeconomic History   Marital status: Single    Spouse name: Not on  file   Number of children: Not on file   Years of education: Not on file   Highest education level: Not on file  Occupational History   Not on file  Tobacco Use   Smoking status: Every Day    Packs/day: 0.50    Years: 41.00    Total pack years: 20.50    Types: Cigarettes   Smokeless tobacco: Never   Tobacco comments:    started age 51  Vaping Use   Vaping Use: Never used  Substance and Sexual Activity   Alcohol use: Yes    Alcohol/week: 12.0 standard drinks of alcohol    Types: 12 Cans of beer per week    Comment: at least a 6 pack of beer daily and often a 12 pack   Drug use: Not Currently    Types: Cocaine, Marijuana   Sexual activity: Not on file  Other Topics Concern   Not on file  Social History Narrative   Single, lives in [place] with his mother.  He does not routinely exercise.  He sometimes works, helping to deliver appliances to homes.   Social Determinants of Health   Financial Resource Strain: Not on file  Food Insecurity: No Food Insecurity (01/19/2023)   Hunger Vital Sign    Worried About Running Out of Food in the Last Year: Never true    Ran Out of Food in the Last Year: Never true  Recent Concern: Food Insecurity - Food Insecurity Present (11/19/2022)   Hunger Vital Sign    Worried About Running Out of Food in the Last Year: Sometimes true    Ran Out of Food in the Last Year: Sometimes true  Transportation Needs: No Transportation Needs (01/19/2023)   PRAPARE - Hydrologist (Medical): No    Lack of Transportation (Non-Medical): No  Physical Activity: Not on file  Stress: Not on file  Social Connections: Not  on file  Intimate Partner Violence: Not At Risk (01/19/2023)   Humiliation, Afraid, Rape, and Kick questionnaire    Fear of Current or Ex-Partner: No    Emotionally Abused: No    Physically Abused: No    Sexually Abused: No    Family History  Problem Relation Age of Onset   Coronary artery disease Mother         alive   Other Other        2 sisters alive and well   Alcohol abuse Father        died @ 93   Other Paternal Grandmother        PACER    Review of Systems:  As stated in the HPI and otherwise negative.   There were no vitals taken for this visit.  Physical Examination:  General: Well developed, well nourished, NAD  HEENT: OP clear, mucus membranes moist  SKIN: warm, dry. No rashes. Neuro: No focal deficits  Musculoskeletal: Muscle strength 5/5 all ext  Psychiatric: Mood and affect normal  Neck: No JVD, no carotid bruits, no thyromegaly, no lymphadenopathy.  Lungs:Clear bilaterally, no wheezes, rhonci, crackles Cardiovascular: Regular rate and rhythm. No murmurs, gallops or rubs. Abdomen:Soft. Bowel sounds present. Non-tender.  Extremities: No lower extremity edema. Pulses are 2 + in the bilateral DP/PT.  Echo    Lipid Panel     Component Value Date/Time   CHOL 120 09/30/2022 2225   CHOL 174 03/09/2013 0107   TRIG 64 09/30/2022 2225   TRIG 111 03/09/2013 0107   HDL 20 (L) 09/30/2022 2225   HDL 59 03/09/2013 0107   CHOLHDL 6.0 09/30/2022 2225   VLDL 13 09/30/2022 2225   VLDL 22 03/09/2013 0107   LDLCALC 87 09/30/2022 2225   LDLCALC 110 (H) 09/23/2022 1034   LDLCALC 93 03/09/2013 0107    Assessment and Plan:   1. CAD without angina: He has no exertional chest pain. He is known to have CAD with MI in 2012. LV function normal by echo in January 2022. I will have him continue ASA 81 mg daily, Lisinopril, Coreg and Crestor. He will not resume Imdur given headache at times. Lipids followed in primary care.   2. Tobacco abuse: Smoking cessation advised  3. Polysubstance abuse: Complete cessation of etoh and cocaine advised.   He can proceed with his planned GI workup with endoscopy and colonoscopy without further cardiac workup.   Lauree Chandler 01/22/2023 11:18 AM

## 2023-01-22 NOTE — TOC Initial Note (Signed)
Transition of Care [facility]) - Initial/Assessment Note    Patient Details  Name: Ethan Wells MRN: [id number] Date of Birth: [birth year]  Transition of Care [facility]) CM/SW Contact:    Beverly Sessions, RN Phone Number: 01/22/2023, 7:13 PM  Clinical Narrative:                 Admitted QMV:[id number] shock  [facility]  Patient states that he only livered there 3 days before he was admitted to the hospital  PCP: Wynelle Cleveland - patient states that he has his on transportation to appointments Current home health/prior home health/DME:[facility]  Therapy recommending SNF Patient in agreement Level 2 PASRR pending Fl2 sent for signature Bed search initiated  SA resources added to AVS    Expected Discharge Plan:  (tbd) Barriers to Discharge: Continued Medical Work up   Patient Goals and CMS Choice Patient states their goals for this hospitalization and ongoing recovery are:: patient too lethargic to voice goals          Expected Discharge Plan and Services       Living arrangements for the past 2 months: [place]                                      Prior Living Arrangements/Services Living arrangements for the past 2 months: [place] with:: Self Patient language and need for interpreter reviewed:: Yes        Need for Family Participation in Patient Care: Yes (Comment) Care giver support system in place?: No (comment)   Criminal Activity/Legal Involvement Pertinent to Current Situation/Hospitalization: No - Comment as needed  Activities of Daily Living Home Assistive Devices/Equipment: None ADL Screening (condition at time of admission) Patient's cognitive ability adequate to safely complete daily activities?: Yes Is the patient deaf or have difficulty hearing?: No Does the patient have difficulty seeing, even when wearing glasses/contacts?: No Does the patient have difficulty concentrating, remembering, or making  decisions?: No Patient able to express need for assistance with ADLs?: Yes Does the patient have difficulty dressing or bathing?: No Independently performs ADLs?: Yes (appropriate for developmental age) Does the patient have difficulty walking or climbing stairs?: No Weakness of Legs: Both Weakness of Arms/Wells: None  Permission Sought/Granted                  Emotional Assessment Appearance:: Appears stated age Attitude/Demeanor/Rapport: Lethargic   Orientation: : Oriented to Self, Oriented to Place Alcohol / Substance Use: Illicit Drugs Psych Involvement: No (comment)  Admission diagnosis:  Hemorrhagic shock ([place]) [R57.8] Lactic acidosis [E87.20] Acute blood loss anemia [D62] Rectal bleeding [K62.5] Peripheral edema [R60.9] Demand ischemia [I24.89] NSTEMI (non-ST elevated myocardial infarction) ([place]) [I21.4] Cocaine dependence with cocaine-induced disorder ([place]) [F14.29] Acute kidney injury ([place]) [N17.9] Alcohol dependence with unspecified alcohol-induced disorder ([place]) [F10.29] History of benzodiazepine use [Z87.898] Patient Active Problem List   Diagnosis Date Noted   Hypophosphatemia 01/22/2023   Acute respiratory failure with hypoxia ([place]) 01/20/2023   Hypotension 01/17/2023   Anasarca 01/17/2023   Morbid obesity ([place]) 01/17/2023   Cocaine dependence with cocaine-induced disorder ([place]) 01/17/2023   Rectal bleeding 01/17/2023   Peripheral edema 01/17/2023   History of benzodiazepine use 01/17/2023   Chest pain 11/19/2022   Demand ischemia 11/05/2022   Acute on chronic heart failure with preserved ejection fraction (HFpEF) ([place]) 10/29/2022   Acute respiratory distress 10/29/2022   Elevated rheumatoid factor  10/28/2022   Hyperkalemia 10/25/2022   Hemorrhoids, internal, with bleeding 10/25/2022   Mesenteric artery stenosis ([place]) 10/23/2022   Iron deficiency anemia 10/23/2022   Hematochezia    Abnormal CT scan, colon    Colitis 09/30/2022   Prolonged QT  interval 09/30/2022   Alcohol use disorder 08/19/2022   Acute on chronic blood loss anemia 05/29/2022   GI bleed 05/28/2022   Non-intractable vomiting    Epigastric pain    Polyp of ascending colon    Acute blood loss anemia 08/07/2021   Acute gastritis    Hyperbilirubinemia    Elevated troponin    Notalgia    History of ST elevation myocardial infarction (STEMI) 12/19/2020   Alcohol dependence ([place]) 12/19/2020   Depression with anxiety 10/05/2020   Esophageal reflux 10/05/2020   Headache 10/05/2020   Hyperlipidemia 10/05/2020   Hyponatremia 06/04/2020   Hypomagnesemia 59/74/1638   Chronic systolic CHF (congestive heart failure) ([place]) 06/04/2020   Leukocytosis 06/04/2020   Abdominal pain 06/04/2020   Hypokalemia 06/04/2020   Ischemic cardiomyopathy    Polysubstance abuse ([place])    NSTEMI (non-ST elevated myocardial infarction) ([place]) 07/12/2019   Abdominal pain, RUQ 05/25/2018   Chest pain, mid sternal 11/17/2012   Cocaine abuse ([place])    Marijuana abuse    Tobacco abuse 02/26/2011   Essential hypertension 02/26/2011   CAD (coronary artery disease) 02/26/2011   Other specified forms of chronic ischemic heart disease 02/26/2011   PCP:  Nickola Major, NP Pharmacy:   [id number] [place] [place] [id number]: [phone]: [phone] [facility] [id number]: [phone]: [phone]    Social Determinants of Health (SDOH) Social History: [place] No Food Insecurity (01/19/2023)  Recent Concern: Food Insecurity - Food Insecurity Present (11/19/2022)  Housing: Low Risk  (01/19/2023)  Recent Concern: Housing - High Risk (11/19/2022)  Transportation Needs: No Transportation Needs (01/19/2023)  Utilities: Not At Risk (01/19/2023)  Alcohol Screen: Medium Risk (10/01/2022)  Tobacco Use: High Risk (01/18/2023)   SDOH  Interventions:     Readmission Risk Interventions    01/17/2023    3:38 PM 10/22/2022   10:47 AM  Readmission Risk Prevention Plan  Transportation Screening Complete Complete  PCP or Specialist Appt within 3-5 Days  Complete  Social Work Consult for [place]/Counseling  Complete  Palliative Care Screening  Not Applicable  Medication Review (Press photographer Complete Complete  SW Recovery Care/Counseling Consult Not Complete   SW Consult Not Complete Comments lethargic   Palliative Care Screening Not [place] Applic[place]

## 2023-01-22 NOTE — Progress Notes (Signed)
Progress Note   [name] ZOX:[id number] DOB: 27-Mar-1971 DOA: 01/16/2023     5 DOS: the patient was seen and examined on 01/22/2023   Brief hospital course: 52 year old male with past medical history of morbid obesity, CAD, polysubstance abuse, mesenteric ischemia status post stent and history of recent hemorrhoidal bleeding status post hemorrhoidectomy which was complicated by recurrent bleeding requiring repeat visit to the OR and non-STEMI.  At that time, patient was transfused 1 unit packed red blood cell transfusion.  Patient presented back to the emergency room on the night of 2/1 with complaints of lower extremity swelling and dyspnea on exertion.  Patient found to have acute kidney injury, hypotension with systolic blood pressure in the 80s tachypnea and hemoglobin of 6.3.  Vascular imaging noted active rectal bleeding and patient admitted for hemorrhagic shock with secondary blood loss anemia.  Also felt to have high-output heart failure.  Transfused 2 units packed red blood cells plus blood on IV fluids.  Patient also had elevated troponins which peaked as high as the 400s.  General surgery, gastroenterology and cardiology consulted. Patient was also placed on IV Lasix after initial blood transfusion.   Active Problems:   Acute on chronic blood loss anemia   Hematochezia   Hemorrhoids, internal, with bleeding   Acute blood loss anemia   Acute on chronic heart failure with preserved ejection fraction (HFpEF) (HCC)   Anasarca   Acute respiratory failure with hypoxia (HCC)   Demand ischemia   Essential hypertension   CAD (coronary artery disease)   Cocaine dependence with cocaine-induced disorder (HCC)   Alcohol use disorder   Tobacco abuse   Mesenteric artery stenosis (HCC)   Morbid obesity (HCC)   Hypotension   Rectal bleeding   Peripheral edema   History of benzodiazepine use   Assessment and Plan:  * Hemorrhagic shock (HCC)-resolved as of  01/18/2023 Secondary to acute on chronic blood loss anemia/symptomatic anemia Hematochezia, suspect secondary to hemorrhoidal bleeding History of hemorrhoidectomy November 2023 Acute blood loss anemia. Patient has a history of bleeding hemorrhoids requiring hemorrhoidectomy in November 8119 complicated by rebleed and now reports bleeding with each BM CTA showing "Findings suspicious for small active bleed in the rectum".  Patient has been seen by GI and general surgery, deferred for further workup.   Patient condition seem to be improved, no additional bleeding.  Currently also stabilized.    Acute on chronic heart failure with preserved ejection fraction (HFpEF) (HCC) Acute respiratory failure with hypoxia (HCC) Demand ischemia  Echocardiogram results noted ejection fraction 50 to 55% with indeterminate diastolic function.   Patient currently receiving IV Lasix.  Renal function still stable. Oxygenation is gradually improving, currently on 3 L oxygen.  Still volume overloaded. Reviewed EKG, QTc is 315, no QTc interval prolongation.    Acute kidney injury (HCC)-resolved as of 01/18/2023 Secondary to blood loss anemia.    Cirrhosis, alcoholic Alcohol use disorder Continue ceftriaxone to complete a course for sbp ppx   Essential hypertension Pressure is more stable.   Cocaine dependence with cocaine-induced disorder ([place]) Advised to quit.   Tobacco abuse Advised to quit.   Morbid obesity ([place]) Meets criteria with BMI greater than 35 and comorbidities of CAD, CHF   Mesenteric artery stenosis ([place]) S/p stent 10/2022       Subjective:  Patient still has significant short of breath with minimal exertion, still significant hypoxia currently on 3 L oxygen.  Physical Exam: Vitals:   01/21/23 2251 01/22/23 0311 01/22/23  0720 01/22/23 0812  BP:  (!) 98/51 121/67 113/76  Pulse:  87 90 88  Resp:  19  17  Temp:  98.1 F (36.7 C)  98.4 F (36.9 C)  TempSrc:  Oral    SpO2: 95%  97%  98%  Weight:      Height:       General exam: Appears calm and comfortable  Respiratory system: Decreased breathing sounds with fine crackles in the base. Respiratory effort normal. Cardiovascular system: S1 & S2 heard, RRR. No JVD, murmurs, rubs, gallops or clicks.  Gastrointestinal system: Abdomen is nondistended, soft and nontender. No organomegaly or masses felt. Normal bowel sounds heard. Central nervous system: Alert and oriented. No focal neurological deficits. Extremities: 2+ leg edema Skin: No rashes, lesions or ulcers Psychiatry: Mood & affect appropriate.    Data Reviewed:  Lab results reviewed.  Family Communication: None  Disposition: Status is: Inpatient Remains inpatient appropriate because: Severity of disease, IV treatment     Time spent: 50 minutes  Author: Sharen Hones, MD 01/22/2023 1:17 PM  For on call review [email or web address].

## 2023-01-22 NOTE — Consult Note (Signed)
PHARMACY CONSULT NOTE - ELECTROLYTES  Pharmacy Consult for Electrolyte Monitoring and Replacement   Recent Labs: Potassium (mmol/L)  Date Value  01/22/2023 3.7  09/06/2013 3.5   Magnesium (mg/dL)  Date Value  01/22/2023 1.8  03/09/2013 2.2   Calcium (mg/dL)  Date Value  01/22/2023 8.3 (L)   Calcium, Total (mg/dL)  Date Value  09/06/2013 8.9   Albumin (g/dL)  Date Value  01/19/2023 2.7 (L)  09/06/2013 4.0   Phosphorus (mg/dL)  Date Value  01/20/2023 2.6   Sodium (mmol/L)  Date Value  01/22/2023 136  09/06/2013 134 (L)   Corrected Ca: 9.3 mg/dL  Assessment  Ethan Wells is a 52 y.o. male presenting with hemorrhagic shock and acute on HFpEF. PMH significant for PSA (marijuana, cocaine), CAD (hx NSTEMI, STEMI), anemia 2/2 hemorrhoidal bleeding, AUD, mesentery ischemia s/p stent. Pharmacy has been consulted to monitor and replace electrolytes.   diuretics: furosemide 40 mg IV BID  Goal of Therapy: Electrolytes WNL  Plan:  40 mEq oral KCl x 1 2 grams IV magnesium sulfate x 1 Check electrolytes with AM labs  Thank you for allowing pharmacy to be a part of this patient's care.  Lorin Picket, PharmD Clinical Pharmacist 01/22/2023 8:05 AM

## 2023-01-23 ENCOUNTER — Encounter: Payer: Medicare HMO | Admitting: Family

## 2023-01-23 DIAGNOSIS — I5033 Acute on chronic diastolic (congestive) heart failure: Secondary | ICD-10-CM | POA: Diagnosis not present

## 2023-01-23 DIAGNOSIS — R578 Other shock: Secondary | ICD-10-CM | POA: Diagnosis not present

## 2023-01-23 DIAGNOSIS — D62 Acute posthemorrhagic anemia: Secondary | ICD-10-CM | POA: Diagnosis not present

## 2023-01-23 LAB — BASIC METABOLIC PANEL
Anion gap: 7 (ref 5–15)
BUN: 6 mg/dL (ref 6–20)
CO2: 29 mmol/L (ref 22–32)
Calcium: 8.4 mg/dL — ABNORMAL LOW (ref 8.9–10.3)
Chloride: 100 mmol/L (ref 98–111)
Creatinine, Ser: 0.76 mg/dL (ref 0.61–1.24)
GFR, Estimated: >60 mL/min (ref >60)
Glucose, Bld: 102 mg/dL — ABNORMAL HIGH (ref 70–99)
Potassium: 3.4 mmol/L — ABNORMAL LOW (ref 3.5–5.1)
Sodium: 136 mmol/L (ref 135–145)

## 2023-01-23 LAB — PHOSPHORUS: Phosphorus: 2.7 mg/dL (ref 2.5–4.6)

## 2023-01-23 LAB — MAGNESIUM: Magnesium: 1.9 mg/dL (ref 1.7–2.4)

## 2023-01-23 MED ORDER — OXYCODONE HCL 5 MG PO TABS
10.0000 mg | ORAL_TABLET | ORAL | Status: DC | PRN
  Administered 2023-01-23 – 2023-01-31 (×46): 10 mg via ORAL
  Filled 2023-01-23 (×47): qty 2

## 2023-01-23 MED ORDER — POTASSIUM CHLORIDE CRYS ER 20 MEQ PO TBCR: 40.0000 meq | EXTENDED_RELEASE_TABLET | Freq: Once | ORAL | Status: DC

## 2023-01-23 MED ORDER — PANTOPRAZOLE SODIUM 40 MG PO TBEC
40.0000 mg | DELAYED_RELEASE_TABLET | Freq: Two times a day (BID) | ORAL | Status: DC
  Administered 2023-01-23 – 2023-01-31 (×16): 40 mg via ORAL
  Filled 2023-01-23 (×16): qty 1

## 2023-01-23 MED ORDER — POTASSIUM CHLORIDE CRYS ER 20 MEQ PO TBCR
40.0000 meq | EXTENDED_RELEASE_TABLET | Freq: Two times a day (BID) | ORAL | Status: AC
  Administered 2023-01-23 (×2): 40 meq via ORAL
  Filled 2023-01-23 (×2): qty 2

## 2023-01-23 NOTE — TOC Progression Note (Signed)
Transition of Care [facility]) - Progression Note    Patient Details  Name: VALIANT DILLS MRN: [id number] Date of Birth: [birth year]  Transition of Care [facility]) CM/SW Contact  Beverly Sessions, RN Phone Number: 01/23/2023, 3:14 PM  Clinical Narrative:    Rosalie Gums obtained [id number]   Expected Discharge Plan:  (tbd) Barriers to Discharge: Continued Medical Work up  Expected Discharge Plan and Services       Living arrangements for the past 2 months: [place] Determinants of Health (SDOH) Interventions SDOH Screenings   Food Insecurity: No Food Insecurity (01/19/2023)  Recent Concern: [place] Present (11/19/2022)  Housing: [place]  (01/19/2023)  Recent Concern: Housing - High Risk (11/19/2022)  Transportation Needs: No Transportation Needs (01/19/2023)  Utilities: Not At Risk (01/19/2023)  Alcohol Screen: Medium Risk (10/01/2022)  Tobacco Use: High Risk (01/18/2023)    Readmission Risk Interventions    01/17/2023    3:38 PM 10/22/2022   10:47 AM  Readmission Risk Prevention Plan  Transportation Screening Complete Complete  PCP or Specialist Appt within 3-5 Days  Complete  Social Work Consult for [place] Planning/Counseling  [place]  Not Applicable  Medication Review Press photographer) Complete Complete  SW Recovery Care/Counseling Consult Not Complete   SW Consult Not Complete Comments lethargic   Palliative Care Screening Not [place] Not Applicable

## 2023-01-23 NOTE — TOC Progression Note (Signed)
Transition of Care [facility]) - Progression Note    Patient Details  Name: Ethan Wells MRN: [id number] Date of Birth: 1971-01-16  Transition of Care [facility]) CM/SW Contact  Beverly Sessions, RN Phone Number: 01/23/2023, 2:40 PM  Clinical Narrative:    Fl2 and 30 day note uploaded to [place] care offered a bed.  Message sent to [facility] with [facility] to confirm they are accepting admissions    Expected Discharge Plan:  (tbd) Barriers to Discharge: Continued Medical Work up  Expected Discharge Plan and Services       Living arrangements for the past 2 months: [place] Determinants of Health ([place]) Interventions SDOH Screenings   Food Insecurity: No Food Insecurity (01/19/2023)  Recent Concern: [place] Present (11/19/2022)  Housing: [place]  (01/19/2023)  Recent Concern: Housing - High Risk (11/19/2022)  Transportation Needs: No Transportation Needs (01/19/2023)  Utilities: Not At Risk (01/19/2023)  Alcohol Screen: Medium Risk (10/01/2022)  Tobacco Use: High Risk (01/18/2023)    Readmission Risk Interventions    01/17/2023    3:38 PM 10/22/2022   10:47 AM  Readmission Risk Prevention Plan  Transportation Screening Complete Complete  PCP or Specialist Appt within 3-5 Days  Complete  Social Work Consult for [place] Planning/Counseling  [place]  Not Applicable  Medication Review Press photographer) Complete Complete  SW Recovery Care/Counseling Consult Not Complete   SW Consult Not Complete Comments lethargic   Palliative Care Screening Not [place] Not Applicable

## 2023-01-23 NOTE — Progress Notes (Signed)
Progress Note   [name] [id number] DOB: 09/14/1971 DOA: 01/16/2023     6 DOS: the patient was seen and examined on 01/23/2023   Brief hospital course: 52 year old male with past medical history of morbid obesity, CAD, polysubstance abuse, mesenteric ischemia status post stent and history of recent hemorrhoidal bleeding status post hemorrhoidectomy which was complicated by recurrent bleeding requiring repeat visit to the OR and non-STEMI.  At that time, patient was transfused 1 unit packed red blood cell transfusion.  Patient presented back to the emergency room on the night of 2/1 with complaints of lower extremity swelling and dyspnea on exertion.  Patient found to have acute kidney injury, hypotension with systolic blood pressure in the 80s tachypnea and hemoglobin of 6.3.  Vascular imaging noted active rectal bleeding and patient admitted for hemorrhagic shock with secondary blood loss anemia.  Also felt to have high-output heart failure.  Transfused 2 units packed red blood cells plus blood on IV fluids.  Patient also had elevated troponins which peaked as high as the 400s.  General surgery, gastroenterology and cardiology consulted. Patient was also placed on IV Lasix after initial blood transfusion.   Active Problems:   Acute on chronic blood loss anemia   Hematochezia   Hemorrhoids, internal, with bleeding   Acute blood loss anemia   Acute on chronic heart failure with preserved ejection fraction (HFpEF) (HCC)   Anasarca   Hypokalemia   Acute respiratory failure with hypoxia (HCC)   Demand ischemia   Essential hypertension   CAD (coronary artery disease)   Cocaine dependence with cocaine-induced disorder (HCC)   Alcohol use disorder   Tobacco abuse   Mesenteric artery stenosis (HCC)   Morbid obesity (HCC)   Hyponatremia   Hypotension   Rectal bleeding   Peripheral edema   History of benzodiazepine use   Hypophosphatemia   Assessment and Plan: *  Hemorrhagic shock (HCC)-resolved as of 01/18/2023 Secondary to acute on chronic blood loss anemia/symptomatic anemia Hematochezia, suspect secondary to hemorrhoidal bleeding History of hemorrhoidectomy November 2023 Acute blood loss anemia. Patient has a history of bleeding hemorrhoids requiring hemorrhoidectomy in November 2637 complicated by rebleed and now reports bleeding with each BM CTA showing "Findings suspicious for small active bleed in the rectum".  Patient has been seen by GI and general surgery, deferred for further workup.   Patient condition seem to be improved, no additional bleeding.   Hemoglobin was stable.    Acute on chronic heart failure with preserved ejection fraction (HFpEF) (HCC) Acute respiratory failure with hypoxia (HCC) Demand ischemia  Echocardiogram results noted ejection fraction 50 to 55% with indeterminate diastolic function.   Condition improving, will continue 1 more day of IV Lasix.  Consider to switch to oral tomorrow.   Acute kidney injury (HCC)-resolved as of 01/18/2023 Secondary to blood loss anemia.    Cirrhosis, alcoholic Alcohol use disorder Continue ceftriaxone to complete a course for sbp ppx   Essential hypertension Bp stable   Cocaine dependence with cocaine-induced disorder ([place]) Advised to quit.   Tobacco abuse Advised to quit.   Morbid obesity ([place]) Meets criteria with BMI greater than 35 and comorbidities of CAD, CHF   Mesenteric artery stenosis (HCC) S/p stent 10/2022        Subjective: Condition improving, off oxygen.  Short of breath with exertion.  Physical Exam: Vitals:   01/22/23 1949 01/22/23 2343 01/23/23 0441 01/23/23 0849  BP: (!) 92/48 123/67 (!) 109/57 (!) 98/53  Pulse: 86 97 91  90  Resp: 16   17  Temp: 98 F (36.7 C) 97.8 F (36.6 C) 98.1 F (36.7 C) 98.4 F (36.9 C)  TempSrc: Oral Oral Oral Oral  SpO2: 94% 98% 93% 94%  Weight:      Height:       General exam: Appears calm and comfortable   Respiratory system: Decreased breath sounds. Respiratory effort normal. Cardiovascular system: S1 & S2 heard, RRR. No JVD, murmurs, rubs, gallops or clicks.  Gastrointestinal system: Abdomen is nondistended, soft and nontender. No organomegaly or masses felt. Normal bowel sounds heard. Central nervous system: Alert and oriented. No focal neurological deficits. Extremities: Leg edema resolved. Skin: No rashes, lesions or ulcers Psychiatry: Judgement and insight appear normal. Mood & affect appropriate.    Data Reviewed:  Lab results reviewed  Family Communication: None  Disposition: Status is: Inpatient Remains inpatient appropriate because: severity of disease, IV treatment. Disposition to nursing home.     Time spent: 35 minutes  Author: Sharen Hones, MD 01/23/2023 12:43 PM  For on call review [email or web address].

## 2023-01-23 NOTE — Progress Notes (Signed)
PT Cancellation Note  Patient Details Name: DAEVION NAVARETTE MRN: [id number] DOB: [birth year]   Cancelled Treatment:     Pt received resting in bed, states "I just woke up". Pt declining any mobility or out of bed activity due to lethargy, despite understanding benefits. Continue PT per POC next available date/time.   Josie Dixon 01/23/2023, 3:01 PM

## 2023-01-23 NOTE — Consult Note (Addendum)
PHARMACY CONSULT NOTE - ELECTROLYTES  Pharmacy Consult for Electrolyte Monitoring and Replacement   Recent Labs: Potassium (mmol/L)  Date Value  01/23/2023 3.4 (L)  09/06/2013 3.5   Magnesium (mg/dL)  Date Value  01/23/2023 1.9  03/09/2013 2.2   Calcium (mg/dL)  Date Value  01/23/2023 8.4 (L)   Calcium, Total (mg/dL)  Date Value  09/06/2013 8.9   Albumin (g/dL)  Date Value  01/19/2023 2.7 (L)  09/06/2013 4.0   Phosphorus (mg/dL)  Date Value  01/23/2023 2.7   Sodium (mmol/L)  Date Value  01/23/2023 136  09/06/2013 134 (L)   Corrected Ca: 9.3 mg/dL  Assessment  Ethan Wells is a 52 y.o. male presenting with hemorrhagic shock and acute on HFpEF. PMH significant for PSA (marijuana, cocaine), CAD (hx NSTEMI, STEMI), anemia 2/2 hemorrhoidal bleeding, AUD, mesentery ischemia s/p stent. Pharmacy has been consulted to monitor and replace electrolytes.   diuretics: furosemide 40 mg IV BID  Goal of Therapy: Electrolytes WNL  Plan:  40 mEq oral BID KCl x 2 doses(per provider) Check electrolytes with AM labs  Thank you for allowing pharmacy to be a part of this patient's care.  Lorin Picket, PharmD Clinical Pharmacist 01/23/2023 7:39 AM

## 2023-01-24 DIAGNOSIS — R578 Other shock: Secondary | ICD-10-CM | POA: Diagnosis not present

## 2023-01-24 DIAGNOSIS — D75838 Other thrombocytosis: Secondary | ICD-10-CM | POA: Insufficient documentation

## 2023-01-24 DIAGNOSIS — D62 Acute posthemorrhagic anemia: Secondary | ICD-10-CM | POA: Diagnosis not present

## 2023-01-24 DIAGNOSIS — I5033 Acute on chronic diastolic (congestive) heart failure: Secondary | ICD-10-CM | POA: Diagnosis not present

## 2023-01-24 LAB — BASIC METABOLIC PANEL
Anion gap: 5 (ref 5–15)
BUN: 5 mg/dL — ABNORMAL LOW (ref 6–20)
CO2: 29 mmol/L (ref 22–32)
Calcium: 8.5 mg/dL — ABNORMAL LOW (ref 8.9–10.3)
Chloride: 101 mmol/L (ref 98–111)
Creatinine, Ser: 0.74 mg/dL (ref 0.61–1.24)
GFR, Estimated: >60 mL/min (ref >60)
Glucose, Bld: 102 mg/dL — ABNORMAL HIGH (ref 70–99)
Potassium: 3.7 mmol/L (ref 3.5–5.1)
Sodium: 135 mmol/L (ref 135–145)

## 2023-01-24 LAB — CBC WITH DIFFERENTIAL/PLATELET
Abs Immature Granulocytes: 0.03 10*3/uL (ref 0.00–0.07)
Basophils Absolute: 0.1 10*3/uL (ref 0.0–0.1)
Basophils Relative: 1 %
Eosinophils Absolute: 0.1 10*3/uL (ref 0.0–0.5)
Eosinophils Relative: 1 %
HCT: 31.9 % — ABNORMAL LOW (ref 39.0–52.0)
Hemoglobin: 10.2 g/dL — ABNORMAL LOW (ref 13.0–17.0)
Immature Granulocytes: 0 %
Lymphocytes Relative: 17 %
Lymphs Abs: 1.5 10*3/uL (ref 0.7–4.0)
MCH: 28.3 pg (ref 26.0–34.0)
MCHC: 32 g/dL (ref 30.0–36.0)
MCV: 88.6 fL (ref 80.0–100.0)
Monocytes Absolute: 0.9 10*3/uL (ref 0.1–1.0)
Monocytes Relative: 10 %
Neutro Abs: 6.5 10*3/uL (ref 1.7–7.7)
Neutrophils Relative %: 71 %
Platelets: 145 10*3/uL — ABNORMAL LOW (ref 150–400)
RBC: 3.6 MIL/uL — ABNORMAL LOW (ref 4.22–5.81)
RDW: 18.6 % — ABNORMAL HIGH (ref 11.5–15.5)
WBC: 9.1 10*3/uL (ref 4.0–10.5)
nRBC: 0 % (ref 0.0–0.2)

## 2023-01-24 LAB — MAGNESIUM: Magnesium: 1.7 mg/dL (ref 1.7–2.4)

## 2023-01-24 MED ORDER — POTASSIUM CHLORIDE CRYS ER 20 MEQ PO TBCR
40.0000 meq | EXTENDED_RELEASE_TABLET | Freq: Once | ORAL | Status: AC
  Administered 2023-01-24: 40 meq via ORAL
  Filled 2023-01-24: qty 2

## 2023-01-24 MED ORDER — SPIRONOLACTONE 25 MG PO TABS
25.0000 mg | ORAL_TABLET | Freq: Every day | ORAL | Status: DC
  Administered 2023-01-24 – 2023-01-25 (×2): 25 mg via ORAL
  Filled 2023-01-24 (×2): qty 1

## 2023-01-24 MED ORDER — MAGNESIUM SULFATE 2 GM/50ML IV SOLN
2.0000 g | Freq: Once | INTRAVENOUS | Status: AC
  Administered 2023-01-24: 2 g via INTRAVENOUS
  Filled 2023-01-24: qty 50

## 2023-01-24 MED ORDER — POLYVINYL ALCOHOL 1.4 % OP SOLN
1.0000 [drp] | OPHTHALMIC | Status: DC | PRN
  Administered 2023-01-24 (×3): 1 [drp] via OPHTHALMIC
  Filled 2023-01-24: qty 15

## 2023-01-24 MED ORDER — TORSEMIDE 20 MG PO TABS
40.0000 mg | ORAL_TABLET | Freq: Every day | ORAL | Status: DC
  Administered 2023-01-25 – 2023-01-27 (×3): 40 mg via ORAL
  Filled 2023-01-24 (×3): qty 2

## 2023-01-24 NOTE — TOC Progression Note (Signed)
Transition of Care [facility]) - Progression Note    Patient Details  Name: Ethan Wells MRN: [id number] Date of Birth: [birth year]  [facility]) CM/SW Contact  Beverly Sessions, RN Phone Number: 01/24/2023, 1:02 PM  Clinical Narrative:     No bed offers. Patient updated Resent referral to facilities that are still pending   Expected Discharge Plan:  (tbd) Barriers to Discharge: Continued Medical Work up  Expected Discharge Plan and Services       Living arrangements for the past 2 months: [place] Determinants of Health (SDOH) Interventions SDOH Screenings   Food Insecurity: No Food Insecurity (01/19/2023)  Recent Concern: Food Insecurity - Food Insecurity Present (11/19/2022)  Housing: Low Risk  (01/19/2023)  Recent Concern: Housing - High Risk (11/19/2022)  Transportation Needs: No Transportation Needs (01/19/2023)  Utilities: Not At Risk (01/19/2023)  Alcohol Screen: Medium Risk (10/01/2022)  Tobacco Use: High Risk (01/18/2023)    Readmission Risk Interventions    01/17/2023    3:38 PM 10/22/2022   10:47 AM  Readmission Risk Prevention Plan  Transportation Screening Complete Complete  PCP or Specialist Appt within 3-5 Days  Complete  Social Work Consult for [place] Planning/Counseling  [place]  Not Applicable  Medication Review Press photographer) Complete Complete  SW Recovery Care/Counseling Consult Not Complete   SW Consult Not Complete Comments lethargic   Palliative Care Screening Not [place] Not Applicable

## 2023-01-24 NOTE — Consult Note (Signed)
PHARMACY CONSULT NOTE - ELECTROLYTES  Pharmacy Consult for Electrolyte Monitoring and Replacement   Recent Labs: Potassium (mmol/L)  Date Value  01/24/2023 3.7  09/06/2013 3.5   Magnesium (mg/dL)  Date Value  01/24/2023 1.7  03/09/2013 2.2   Calcium (mg/dL)  Date Value  01/24/2023 8.5 (L)   Calcium, Total (mg/dL)  Date Value  09/06/2013 8.9   Albumin (g/dL)  Date Value  01/19/2023 2.7 (L)  09/06/2013 4.0   Phosphorus (mg/dL)  Date Value  01/23/2023 2.7   Sodium (mmol/L)  Date Value  01/24/2023 135  09/06/2013 134 (L)   Corrected Ca: 9.3 mg/dL  Assessment  Ethan Wells is a 53 y.o. male presenting with hemorrhagic shock and acute on HFpEF. PMH significant for PSA (marijuana, cocaine), CAD (hx NSTEMI, STEMI), anemia 2/2 hemorrhoidal bleeding, AUD, mesentery ischemia s/p stent. Pharmacy has been consulted to monitor and replace electrolytes.   diuretics: furosemide 40 mg IV BID  Goal of Therapy: Electrolytes WNL  Plan:  40 mEq oral KCl x 1 Magnesium sulfate 2 grams IV x 1 Check electrolytes with AM labs  Thank you for allowing pharmacy to be a part of this patient's care.  Lorin Picket, PharmD Clinical Pharmacist 01/24/2023 6:37 AM

## 2023-01-24 NOTE — Progress Notes (Addendum)
Pt complaining of eye irritation.  Pt stated that he was putting moisturizing body cream to eyes.  RN discouraged pt from apply body cream to eyes. Pt stated his eyes have been crusting shut in the morning. Clean/wet washcloth given to pt to cleanse eye.  Pt verbalized understanding.  MD notified.

## 2023-01-24 NOTE — Progress Notes (Signed)
Physical Therapy Treatment Patient Details Name: Ethan Wells MRN: [id number] DOB: 01-Jan-1971 Today's Date: 01/24/2023   History of Present Illness 52 year old male with past medical history of morbid obesity, CAD, polysubstance abuse, mesenteric ischemia status post stent and history of recent hemorrhoidal bleeding status post hemorrhoidectomy which was complicated by recurrent bleeding requiring repeat visit to the OR and non-STEMI.  At that time, patient was transfused 1 unit packed red blood cell transfusion.     Patient presented back to the emergency room on the night of 2/1 with complaints of lower extremity swelling and dyspnea on exertion.  Patient found to have acute kidney injury, hypotension with systolic blood pressure in the 80s tachypnea and hemoglobin of 6.3.  Vascular imaging noted active rectal bleeding and patient admitted for hemorrhagic shock with secondary blood loss anemia.  Also felt to have high-output heart failure.  Transfused 2 units packed red blood cells plus blood on IV fluids.  Patient also had elevated troponins which peaked as high as the 400s.  General surgery, gastroenterology and cardiology consulted.    PT Comments    Pt agreed to PT with minimal encouragement. Currently on RA with SpO2 between 91-96% throughout session, VSS, denies dizziness with positional changes.  Pt completed gait training in room with his personal "[place]". Pt unsteady at times, reaching for wall/furniture for assist with balance. Pt refused use of RW despite benefits. Overall, tolerated session well. Bed alarm on upon completion due to pt's lethargy, high fall risk, and ? Safety awareness/judgement. Continue PT per POC.    Recommendations for follow up therapy are one component of a multi-disciplinary discharge planning process, led by the attending physician.  Recommendations may be updated based on patient status, additional functional criteria and insurance  authorization.  Follow Up Recommendations  Skilled nursing-short term rehab (<3 hours/day) Can patient physically be transported by private vehicle: Yes   Assistance Recommended at Discharge Frequent or constant Supervision/Assistance  Patient can return home with the following A little help with walking and/or transfers;A little help with bathing/dressing/bathroom;Assist for transportation;Assistance with cooking/housework;Help with stairs or ramp for entrance   Equipment Recommendations  Rolling walker (2 wheels)    Recommendations for Other Services       Precautions / Restrictions Precautions Precautions: Fall Restrictions Weight Bearing Restrictions: No     Mobility  Bed Mobility Overal bed mobility: Needs Assistance Bed Mobility: Supine to Sit, Sit to Supine     Supine to sit: Modified independent (Device/Increase time) Sit to supine: Modified independent (Device/Increase time)        Transfers Overall transfer level: Needs assistance Equipment used: Straight cane Optician, dispensing) Transfers: Sit to/from Stand Sit to Stand: Min guard           General transfer comment:  (Slightly unsteady once standing attained)    Ambulation/Gait Ambulation/Gait assistance: Min guard Gait Distance (Feet): 50 Feet Assistive device: Straight cane (Personal Hurry Cane) Gait Pattern/deviations: Step-through pattern, Decreased stride length, Drifts right/left Gait velocity: decreased     General Gait Details: Frequent UE support on bedside surface to maintain upright gait and balance despite SPC use in contralateral UE's. Pt declined using RW   Stairs             Wheelchair Mobility    Modified Rankin (Stroke Patients Only)       Balance Overall balance assessment: Needs assistance Sitting-balance support: No upper extremity supported, Feet unsupported Sitting balance-Leahy Scale: Good     Standing balance support:  Single extremity supported, During  functional activity, Reliant on assistive device for balance Standing balance-Leahy Scale: Poor Standing balance comment: Pt's balance improved with B UE support to "Fair"                            Cognition Arousal/Alertness: Awake/alert Behavior During Therapy: WFL for tasks assessed/performed Overall Cognitive Status: Within Functional Limits for tasks assessed                                          Exercises      General Comments General comments (skin integrity, edema, etc.):  (Education provided to pt with good understanding regarding role of PT, current goals, importance of OOB activity, and increasing mobility)      Pertinent Vitals/Pain Pain Assessment Pain Assessment: 0-10 Pain Score: 8  Pain Location: All over Pain Descriptors / Indicators: Aching, Discomfort Pain Intervention(s): Limited activity within patient's tolerance, Patient requesting pain meds-RN notified    Home Living                          Prior Function            PT Goals (current goals can now be found in the care plan section) Acute Rehab PT Goals Patient Stated Goal: improve mobility Progress towards PT goals: Progressing toward goals    Frequency    Min 2X/week      PT Plan Current plan remains appropriate    Co-evaluation              AM-PAC PT "6 Clicks" Mobility   Outcome Measure  Help needed turning from your back to your side while in a flat bed without using bedrails?: A Little Help needed moving from lying on your back to sitting on the side of a flat bed without using bedrails?: A Little Help needed moving to and from a bed to a chair (including a wheelchair)?: A Lot Help needed standing up from a chair using your arms (e.g., wheelchair or bedside chair)?: A Little Help needed to walk in hospital room?: A Lot Help needed climbing 3-5 steps with a railing? : A Lot 6 Click Score: 15    End of Session Equipment Utilized  During Treatment: Gait belt Activity Tolerance: Patient tolerated treatment well Patient left: in bed;with call bell/phone within reach;with bed alarm set Nurse Communication: Mobility status;Other (comment) (c/o eye lid irritation) PT Visit Diagnosis: Unsteadiness on feet (R26.81);Other abnormalities of gait and mobility (R26.89);Muscle weakness (generalized) (M62.81)     Time: [id number] PT Time Calculation (min) (ACUTE ONLY): 24 min  Charges:  $Gait Training: 8-22 mins $Therapeutic Exercise: 8-22 mins                    Mikel Cella, PTA  Josie Dixon 01/24/2023, 1:34 PM

## 2023-01-24 NOTE — Care Management Important Message (Signed)
Important Message  Patient Details  Name: Ethan Wells MRN: [id number] Date of Birth: 1971/11/01   Medicare Important Message Given:  Yes     [name] 01/24/2023, 1:00 PM

## 2023-01-24 NOTE — Progress Notes (Signed)
Progress Note   [name] [id number] DOB: [birth year] DOA: 01/16/2023     7 DOS: the patient was seen and examined on 01/24/2023   Brief hospital course: 52 year old male with past medical history of morbid obesity, CAD, polysubstance abuse, mesenteric ischemia status post stent and history of recent hemorrhoidal bleeding status post hemorrhoidectomy which was complicated by recurrent bleeding requiring repeat visit to the OR and non-STEMI.  At that time, patient was transfused 1 unit packed red blood cell transfusion.  Patient presented back to the emergency room on the night of 2/1 with complaints of lower extremity swelling and dyspnea on exertion.  Patient found to have acute kidney injury, hypotension with systolic blood pressure in the 80s tachypnea and hemoglobin of 6.3.  Vascular imaging noted active rectal bleeding and patient admitted for hemorrhagic shock with secondary blood loss anemia.  Also felt to have high-output heart failure.  Transfused 2 units packed red blood cells plus blood on IV fluids.  Patient also had elevated troponins which peaked as high as the 400s.  General surgery, gastroenterology and cardiology consulted. Patient was also placed on IV Lasix after initial blood transfusion. 2/9.  Patient condition improved, diuretics switched to oral.  Pending nursing placement.   Active Problems:   Acute on chronic blood loss anemia   Hematochezia   Hemorrhoids, internal, with bleeding   Acute blood loss anemia   Acute on chronic heart failure with preserved ejection fraction (HFpEF) (HCC)   Anasarca   Hypokalemia   Acute respiratory failure with hypoxia (HCC)   Demand ischemia   Essential hypertension   CAD (coronary artery disease)   Cocaine dependence with cocaine-induced disorder (HCC)   Alcohol use disorder   Tobacco abuse   Mesenteric artery stenosis (HCC)   Morbid obesity (HCC)   Hyponatremia   Hypotension   Rectal bleeding   Peripheral  edema   History of benzodiazepine use   Hypophosphatemia   Reactive thrombocytosis   Assessment and Plan:   Hemorrhagic shock (HCC)-resolved as of 01/18/2023 Secondary to acute on chronic blood loss anemia/symptomatic anemia Hematochezia, suspect secondary to hemorrhoidal bleeding History of hemorrhoidectomy November 2023 Acute blood loss anemia. Patient has a history of bleeding hemorrhoids requiring hemorrhoidectomy in November [id number] complicated by rebleed and now reports bleeding with each BM CTA showing "Findings suspicious for small active bleed in the rectum".  Patient has been seen by GI and general surgery, deferred for further workup.   Patient condition seem to be improved, no additional bleeding.   Condition has improved, no new issue.    Acute on chronic heart failure with preserved ejection fraction (HFpEF) (HCC) Acute respiratory failure with hypoxia (HCC) Demand ischemia  Echocardiogram results noted ejection fraction 50 to 55% with indeterminate diastolic function.   Patient improved, change diuretics to oral torsemide and Aldactone.   Acute kidney injury (HCC)-resolved as of 01/18/2023 Secondary to blood loss anemia.    Cirrhosis, alcoholic Alcohol use disorder Improved, completed a course of Rocephin.   Essential hypertension Bp stable   Cocaine dependence with cocaine-induced disorder ([place]) Advised to quit.   Tobacco abuse Advised to quit.   Morbid obesity ([place]) Meets criteria with BMI greater than 35 and comorbidities of CAD, CHF   Mesenteric artery stenosis ([place]) S/p stent 10/2022       Subjective:  Patient still complaining of pain all over and tired.  Otherwise doing well.  Physical Exam: Vitals:   01/23/23 [id number] 01/23/23 1555 01/23/23 2121 01/24/23 [id number]  BP: (!) 98/53 121/60 94/62 (!) 96/54  Pulse: 90 92 83 87  Resp: 17 18 18 18  $ Temp: 98.4 F (36.9 C) 98.1 F (36.7 C) 98.3 F (36.8 C) 98.1 F (36.7 C)  TempSrc: Oral Oral Oral Oral   SpO2: 94% 95% 93% 95%  Weight:      Height:       General exam: Appears calm and comfortable  Respiratory system: Clear to auscultation. Respiratory effort normal. Cardiovascular system: S1 & S2 heard, RRR. No JVD, murmurs, rubs, gallops or clicks. No pedal edema. Gastrointestinal system: Abdomen is nondistended, soft and nontender. No organomegaly or masses felt. Normal bowel sounds heard. Central nervous system: Alert and oriented. No focal neurological deficits. Extremities: Symmetric 5 x 5 power. Skin: No rashes, lesions or ulcers Psychiatry: Judgement and insight appear normal. Mood & affect appropriate.    Data Reviewed:  Lab results reviewed.  Family Communication: None  Disposition: Status is: Inpatient Remains inpatient appropriate because: Unsafe discharge, pending nursing placement     Time spent: 35 minutes  Author: Sharen Hones, MD 01/24/2023 12:30 PM  For on call review [email or web address].

## 2023-01-25 DIAGNOSIS — D62 Acute posthemorrhagic anemia: Secondary | ICD-10-CM | POA: Diagnosis not present

## 2023-01-25 DIAGNOSIS — R578 Other shock: Secondary | ICD-10-CM | POA: Diagnosis not present

## 2023-01-25 DIAGNOSIS — I5033 Acute on chronic diastolic (congestive) heart failure: Secondary | ICD-10-CM | POA: Diagnosis not present

## 2023-01-25 LAB — BASIC METABOLIC PANEL
Anion gap: 6 (ref 5–15)
BUN: 6 mg/dL (ref 6–20)
CO2: 27 mmol/L (ref 22–32)
Calcium: 8.6 mg/dL — ABNORMAL LOW (ref 8.9–10.3)
Chloride: 98 mmol/L (ref 98–111)
Creatinine, Ser: 0.75 mg/dL (ref 0.61–1.24)
GFR, Estimated: >60 mL/min (ref >60)
Glucose, Bld: 114 mg/dL — ABNORMAL HIGH (ref 70–99)
Potassium: 3.9 mmol/L (ref 3.5–5.1)
Sodium: 131 mmol/L — ABNORMAL LOW (ref 135–145)

## 2023-01-25 LAB — PHOSPHORUS: Phosphorus: 2.5 mg/dL (ref 2.5–4.6)

## 2023-01-25 LAB — MAGNESIUM: Magnesium: 1.8 mg/dL (ref 1.7–2.4)

## 2023-01-25 MED ORDER — MAGNESIUM SULFATE 2 GM/50ML IV SOLN
2.0000 g | Freq: Once | INTRAVENOUS | Status: AC
  Administered 2023-01-25: 2 g via INTRAVENOUS
  Filled 2023-01-25: qty 50

## 2023-01-25 NOTE — Consult Note (Signed)
PHARMACY CONSULT NOTE - ELECTROLYTES  Pharmacy Consult for Electrolyte Monitoring and Replacement   Recent Labs: Potassium (mmol/L)  Date Value  01/25/2023 3.9  09/06/2013 3.5   Magnesium (mg/dL)  Date Value  01/25/2023 1.8  03/09/2013 2.2   Calcium (mg/dL)  Date Value  01/25/2023 8.6 (L)   Calcium, Total (mg/dL)  Date Value  09/06/2013 8.9   Albumin (g/dL)  Date Value  01/19/2023 2.7 (L)  09/06/2013 4.0   Phosphorus (mg/dL)  Date Value  01/25/2023 2.5   Sodium (mmol/L)  Date Value  01/25/2023 131 (L)  09/06/2013 134 (L)   Corrected Ca: 9.3 mg/dL  Assessment  Ethan Wells is a 52 y.o. male presenting with hemorrhagic shock and acute on HFpEF. PMH significant for PSA (marijuana, cocaine), CAD (hx NSTEMI, STEMI), anemia 2/2 hemorrhoidal bleeding, AUD, mesentery ischemia s/p stent. Pharmacy has been consulted to monitor and replace electrolytes.   diuretics: furosemide 40 mg IV BID - stopped 2/9.   Goal of Therapy: Electrolytes WNL  Plan:  Magnesium sulfate 2 grams IV x 1. Pt transferred to the med surg floor. Pharmacy will sign off. Please re-consult if needed.   Thank you for allowing pharmacy to be a part of this patient's care.  Oswald Hillock, PharmD Clinical Pharmacist 01/25/2023 7:25 AM

## 2023-01-25 NOTE — Progress Notes (Signed)
Progress Note   [name] [id number] DOB: 1971-03-04 DOA: 01/16/2023     8 DOS: the patient was seen and examined on 01/25/2023   Brief hospital course: 52 year old male with past medical history of morbid obesity, CAD, polysubstance abuse, mesenteric ischemia status post stent and history of recent hemorrhoidal bleeding status post hemorrhoidectomy which was complicated by recurrent bleeding requiring repeat visit to the OR and non-STEMI.  At that time, patient was transfused 1 unit packed red blood cell transfusion.  Patient presented back to the emergency room on the night of 2/1 with complaints of lower extremity swelling and dyspnea on exertion.  Patient found to have acute kidney injury, hypotension with systolic blood pressure in the 80s tachypnea and hemoglobin of 6.3.  Vascular imaging noted active rectal bleeding and patient admitted for hemorrhagic shock with secondary blood loss anemia.  Also felt to have high-output heart failure.  Transfused 2 units packed red blood cells plus blood on IV fluids.  Patient also had elevated troponins which peaked as high as the 400s.  General surgery, gastroenterology and cardiology consulted. Patient was also placed on IV Lasix after initial blood transfusion. 2/9.  Patient condition improved, diuretics switched to oral.  Pending nursing placement.   Active Problems:   Acute on chronic blood loss anemia   Hematochezia   Hemorrhoids, internal, with bleeding   Acute blood loss anemia   Acute on chronic heart failure with preserved ejection fraction (HFpEF) (HCC)   Anasarca   Hypokalemia   Acute respiratory failure with hypoxia (HCC)   Demand ischemia   Essential hypertension   CAD (coronary artery disease)   Cocaine dependence with cocaine-induced disorder (HCC)   Alcohol use disorder   Tobacco abuse   Mesenteric artery stenosis (HCC)   Morbid obesity (HCC)   Hyponatremia   Hypomagnesemia   Hypotension   Rectal  bleeding   Peripheral edema   History of benzodiazepine use   Hypophosphatemia   Reactive thrombocytosis   Assessment and Plan:   Hemorrhagic shock (HCC)-resolved as of 01/18/2023 Secondary to acute on chronic blood loss anemia/symptomatic anemia Hematochezia, suspect secondary to hemorrhoidal bleeding History of hemorrhoidectomy November 2023 Acute blood loss anemia. Patient has a history of bleeding hemorrhoids requiring hemorrhoidectomy in November [id number] complicated by rebleed and now reports bleeding with each BM CTA showing "Findings suspicious for small active bleed in the rectum".  Patient has been seen by GI and general surgery, deferred for further workup.   Patient condition seem to be improved, no additional bleeding.   Condition has improved, no new issue.    Acute on chronic heart failure with preserved ejection fraction (HFpEF) (HCC) Acute respiratory failure with hypoxia (HCC) Demand ischemia  Echocardiogram results noted ejection fraction 50 to 55% with indeterminate diastolic function.   Condition stabilized, continue oral diuretics.   Acute kidney injury (HCC)-resolved as of 01/18/2023 Secondary to blood loss anemia.    Cirrhosis, alcoholic Alcohol use disorder Improved, completed a course of Rocephin.   Essential hypertension Bp stable   Cocaine dependence with cocaine-induced disorder ([place]) Advised to quit.   Tobacco abuse Advised to quit.   Morbid obesity ([place]) Meets criteria with BMI greater than 35 and comorbidities of CAD, CHF   Mesenteric artery stenosis (HCC) S/p stent 10/2022   Patient condition has improved, currently pending nursing home placement.    Subjective:  Patient doing well today, was able to walk with  nurse.  Physical Exam: Vitals:   01/24/23 1550 01/24/23 1943  01/25/23 0427 01/25/23 0816  BP: (!) 113/57 (!) 97/51 (!) 105/56 (!) 93/56  Pulse: 82 84 91 92  Resp: 20 20 18 18  $ Temp: 98.1 F (36.7 C) 98.3 F (36.8 C) 99.3  F (37.4 C) 98.1 F (36.7 C)  TempSrc:  Oral Oral   SpO2: 91% 94% 94% 94%  Weight:      Height:       General exam: Appears calm and comfortable  Respiratory system: Clear to auscultation. Respiratory effort normal. Cardiovascular system: S1 & S2 heard, RRR. No JVD, murmurs, rubs, gallops or clicks. No pedal edema. Gastrointestinal system: Abdomen is nondistended, soft and nontender. No organomegaly or masses felt. Normal bowel sounds heard. Central nervous system: Alert and oriented. No focal neurological deficits. Extremities: Symmetric 5 x 5 power. Skin: No rashes, lesions or ulcers Psychiatry: Judgement and insight appear normal. Mood & affect appropriate.    Data Reviewed:  Lab results reviewed.  Family Communication: None  Disposition: Status is: Inpatient Remains inpatient appropriate because: Unsafe discharge, pending nursing home placement.     Time spent: 35 minutes  Author: Sharen Hones, MD 01/25/2023 1:16 PM  For on call review [email or web address].

## 2023-01-26 DIAGNOSIS — D62 Acute posthemorrhagic anemia: Secondary | ICD-10-CM | POA: Diagnosis not present

## 2023-01-26 DIAGNOSIS — R578 Other shock: Secondary | ICD-10-CM | POA: Diagnosis not present

## 2023-01-26 DIAGNOSIS — J9601 Acute respiratory failure with hypoxia: Secondary | ICD-10-CM | POA: Diagnosis not present

## 2023-01-26 MED ORDER — CHLORHEXIDINE GLUCONATE CLOTH 2 % EX PADS
6.0000 | MEDICATED_PAD | Freq: Every day | CUTANEOUS | Status: DC
  Administered 2023-01-27 – 2023-01-31 (×4): 6 via TOPICAL

## 2023-01-26 NOTE — Progress Notes (Signed)
Progress Note   [name] [id number] DOB: Apr 04, 1971 DOA: 01/16/2023     9 DOS: the patient was seen and examined on 01/26/2023   Brief hospital course: 52 year old male with past medical history of morbid obesity, CAD, polysubstance abuse, mesenteric ischemia status post stent and history of recent hemorrhoidal bleeding status post hemorrhoidectomy which was complicated by recurrent bleeding requiring repeat visit to the OR and non-STEMI.  At that time, patient was transfused 1 unit packed red blood cell transfusion.  Patient presented back to the emergency room on the night of 2/1 with complaints of lower extremity swelling and dyspnea on exertion.  Patient found to have acute kidney injury, hypotension with systolic blood pressure in the 80s tachypnea and hemoglobin of 6.3.  Vascular imaging noted active rectal bleeding and patient admitted for hemorrhagic shock with secondary blood loss anemia.  Also felt to have high-output heart failure.  Transfused 2 units packed red blood cells plus blood on IV fluids.  Patient also had elevated troponins which peaked as high as the 400s.  General surgery, gastroenterology and cardiology consulted. Patient was also placed on IV Lasix after initial blood transfusion. 2/9.  Patient condition improved, diuretics switched to oral.  Pending nursing placement.   Active Problems:   Acute on chronic blood loss anemia   Hematochezia   Hemorrhoids, internal, with bleeding   Acute blood loss anemia   Acute on chronic heart failure with preserved ejection fraction (HFpEF) (HCC)   Anasarca   Hypokalemia   Acute respiratory failure with hypoxia (HCC)   Demand ischemia   Essential hypertension   CAD (coronary artery disease)   Cocaine dependence with cocaine-induced disorder (HCC)   Alcohol use disorder   Tobacco abuse   Mesenteric artery stenosis (HCC)   Morbid obesity (HCC)   Hyponatremia   Hypomagnesemia   Hypotension   Rectal  bleeding   Peripheral edema   History of benzodiazepine use   Hypophosphatemia   Reactive thrombocytosis   Assessment and Plan:  Hemorrhagic shock (HCC)-resolved as of 01/18/2023 Secondary to acute on chronic blood loss anemia/symptomatic anemia Hematochezia, suspect secondary to hemorrhoidal bleeding History of hemorrhoidectomy November 2023 Acute blood loss anemia. Patient has a history of bleeding hemorrhoids requiring hemorrhoidectomy in November [id number] complicated by rebleed and now reports bleeding with each BM CTA showing "Findings suspicious for small active bleed in the rectum".  Patient has been seen by GI and general surgery, deferred for further workup.   Patient condition seem to be improved, no additional bleeding.       Acute on chronic heart failure with preserved ejection fraction (HFpEF) (HCC) Acute respiratory failure with hypoxia (HCC) Demand ischemia  Echocardiogram results noted ejection fraction 50 to 55% with indeterminate diastolic function.   Volume status much better, patient developed some hyponatremia, discontinue Aldactone, continue torsemide.   Acute kidney injury (HCC)-resolved as of 01/18/2023 Hyponatremia. Secondary to blood loss anemia.  Continue to follow sodium level.   Cirrhosis, alcoholic Alcohol use disorder Improved, completed a course of Rocephin.   Essential hypertension Bp stable   Cocaine dependence with cocaine-induced disorder ([place]) Advised to quit.   Tobacco abuse Advised to quit.   Morbid obesity ([place]) Meets criteria with BMI greater than 35 and comorbidities of CAD, CHF   Mesenteric artery stenosis (HCC) S/p stent 10/2022      Subjective:  Patient still complaining not feeling well, has some stomach ache, weakness, pain all over.  But no shortness of breath.  Physical Exam:  Vitals:   01/25/23 1618 01/25/23 2108 01/26/23 0616 01/26/23 0731  BP: 98/69 (!) 105/59 (!) 90/53 (!) 84/41  Pulse: 89 85 84 82  Resp: 18 18  18 18  $ Temp: 99.3 F (37.4 C) 97.7 F (36.5 C) 98.4 F (36.9 C) 98.3 F (36.8 C)  TempSrc: Oral Oral Oral   SpO2: 97% 94% 94% 92%  Weight:      Height:       General exam: Appears calm and comfortable  Respiratory system: Clear to auscultation. Respiratory effort normal. Cardiovascular system: S1 & S2 heard, RRR. No JVD, murmurs, rubs, gallops or clicks. No pedal edema. Gastrointestinal system: Abdomen is nondistended, soft and nontender. No organomegaly or masses felt. Normal bowel sounds heard. Central nervous system: Alert and oriented x3. No focal neurological deficits. Extremities: Symmetric 5 x 5 power. Skin: No rashes, lesions or ulcers Psychiatry: Judgement and insight appear normal. Mood & affect appropriate.    Data Reviewed:  Lab results reviewed.  Family Communication: None  Disposition: Status is: Inpatient Remains inpatient appropriate because: Severity of disease, unsafe discharge pending nursing placement.     Time spent: 35 minutes  Author: Sharen Hones, MD 01/26/2023 2:00 PM  For on call review [email or web address]

## 2023-01-26 NOTE — TOC Progression Note (Signed)
Transition of Care [facility]) - Progression Note    Patient Details  Name: Ethan Wells MRN: [id number] Date of Birth: 1971/08/24  Transition of Care [facility]) CM/SW [place], LCSW Phone Number: 01/26/2023, 1:17 PM  Clinical Narrative:   No bed offers. This morning, CSW sent updated therapy notes to facilities that had not responded.  Expected Discharge Plan:  (tbd) Barriers to Discharge: Continued Medical Work up  Expected Discharge Plan and Services       Living arrangements for the past 2 months: [place] Determinants of Health (SDOH) Interventions SDOH Screenings   Food Insecurity: No Food Insecurity (01/19/2023)  Recent Concern: Food Insecurity - Food Insecurity Present (11/19/2022)  Housing: Low Risk  (01/19/2023)  Recent Concern: Housing - High Risk (11/19/2022)  Transportation Needs: No Transportation Needs (01/19/2023)  Utilities: Not At Risk (01/19/2023)  Alcohol Screen: Medium Risk (10/01/2022)  Tobacco Use: High Risk (01/18/2023)    Readmission Risk Interventions    01/17/2023    3:38 PM 10/22/2022   10:47 AM  Readmission Risk Prevention Plan  Transportation Screening Complete Complete  PCP or Specialist Appt within 3-5 Days  Complete  Social Work Consult for [place] Planning/Counseling  [place]  Not Applicable  Medication Review Press photographer) Complete Complete  SW Recovery Care/Counseling Consult Not Complete   SW Consult Not Complete Comments lethargic   Palliative Care Screening Not [place] Not Applicable

## 2023-01-27 DIAGNOSIS — J9601 Acute respiratory failure with hypoxia: Secondary | ICD-10-CM | POA: Diagnosis not present

## 2023-01-27 DIAGNOSIS — D62 Acute posthemorrhagic anemia: Secondary | ICD-10-CM | POA: Diagnosis not present

## 2023-01-27 DIAGNOSIS — I9589 Other hypotension: Secondary | ICD-10-CM

## 2023-01-27 DIAGNOSIS — I5033 Acute on chronic diastolic (congestive) heart failure: Secondary | ICD-10-CM | POA: Diagnosis not present

## 2023-01-27 DIAGNOSIS — R578 Other shock: Secondary | ICD-10-CM | POA: Diagnosis not present

## 2023-01-27 MED ORDER — QUETIAPINE FUMARATE 25 MG PO TABS
25.0000 mg | ORAL_TABLET | Freq: Every day | ORAL | Status: DC
  Administered 2023-01-27 – 2023-01-30 (×4): 25 mg via ORAL
  Filled 2023-01-27 (×4): qty 1

## 2023-01-27 MED ORDER — TORSEMIDE 20 MG PO TABS
20.0000 mg | ORAL_TABLET | Freq: Every day | ORAL | Status: DC
  Administered 2023-01-28 – 2023-01-31 (×4): 20 mg via ORAL
  Filled 2023-01-27 (×4): qty 1

## 2023-01-27 NOTE — TOC Progression Note (Addendum)
Transition of Care [facility]) - Progression Note    Patient Details  Name: Ethan Wells MRN: [id number] Date of Birth: July 05, 1971  Transition of Care [facility]) CM/SW [place], LCSW Phone Number: 01/27/2023, 8:24 AM  Clinical Narrative:  No bed offers this morning.   3:28 pm: No bed offers.  Expected Discharge Plan:  (tbd) Barriers to Discharge: Continued Medical Work up  Expected Discharge Plan and Services       Living arrangements for the past 2 months: [place] Determinants of Health (SDOH) Interventions SDOH Screenings   Food Insecurity: No Food Insecurity (01/19/2023)  Recent Concern: Food Insecurity - Food Insecurity Present (11/19/2022)  Housing: Low Risk  (01/19/2023)  Recent Concern: Housing - High Risk (11/19/2022)  Transportation Needs: No Transportation Needs (01/19/2023)  Utilities: Not At Risk (01/19/2023)  Alcohol Screen: Medium Risk (10/01/2022)  Tobacco Use: High Risk (01/18/2023)    Readmission Risk Interventions    01/17/2023    3:38 PM 10/22/2022   10:47 AM  Readmission Risk Prevention Plan  Transportation Screening Complete Complete  PCP or Specialist Appt within 3-5 Days  Complete  Social Work Consult for [place] Planning/Counseling  [place]  Not Applicable  Medication Review Press photographer) Complete Complete  SW Recovery Care/Counseling Consult Not Complete   SW Consult Not Complete Comments lethargic   Palliative Care Screening Not [place] Not Applicable

## 2023-01-27 NOTE — Progress Notes (Signed)
Progress Note   [name] [id number] DOB: 03-26-1971 DOA: 01/16/2023     10 DOS: the patient was seen and examined on 01/27/2023   Brief hospital course: 52 year old male with past medical history of morbid obesity, CAD, polysubstance abuse, mesenteric ischemia status post stent and history of recent hemorrhoidal bleeding status post hemorrhoidectomy which was complicated by recurrent bleeding requiring repeat visit to the OR and non-STEMI.  At that time, patient was transfused 1 unit packed red blood cell transfusion.  Patient presented back to the emergency room on the night of 2/1 with complaints of lower extremity swelling and dyspnea on exertion.  Patient found to have acute kidney injury, hypotension with systolic blood pressure in the 80s tachypnea and hemoglobin of 6.3.  Vascular imaging noted active rectal bleeding and patient admitted for hemorrhagic shock with secondary blood loss anemia.  Also felt to have high-output heart failure.  Transfused 2 units packed red blood cells plus blood on IV fluids.  Patient also had elevated troponins which peaked as high as the 400s.  General surgery, gastroenterology and cardiology consulted. Patient was also placed on IV Lasix after initial blood transfusion. 2/9.  Patient condition improved, diuretics switched to oral.  Pending nursing placement.   Active Problems:   Acute on chronic blood loss anemia   Hematochezia   Hemorrhoids, internal, with bleeding   Acute blood loss anemia   Acute on chronic heart failure with preserved ejection fraction (HFpEF) (HCC)   Anasarca   Hypokalemia   Acute respiratory failure with hypoxia (HCC)   Demand ischemia   Essential hypertension   CAD (coronary artery disease)   Cocaine dependence with cocaine-induced disorder (HCC)   Alcohol use disorder   Tobacco abuse   Mesenteric artery stenosis (HCC)   Morbid obesity (HCC)   Hyponatremia   Hypomagnesemia   Hypotension   Rectal  bleeding   Peripheral edema   History of benzodiazepine use   Hypophosphatemia   Reactive thrombocytosis   Assessment and Plan: Hemorrhagic shock (HCC)-resolved as of 01/18/2023 Secondary to acute on chronic blood loss anemia/symptomatic anemia Hematochezia, suspect secondary to hemorrhoidal bleeding History of hemorrhoidectomy November 2023 Acute blood loss anemia. Patient has a history of bleeding hemorrhoids requiring hemorrhoidectomy in November [id number] complicated by rebleed and now reports bleeding with each BM CTA showing "Findings suspicious for small active bleed in the rectum".  Patient has been seen by GI and general surgery, deferred for further workup.   Patient condition seem to be improved, no additional bleeding.      Essential hypertension Hypotension. Patient is not on any blood pressure medicine, intermittent hypotension still present.  Patient was taking midodrine.  I will check cortisol level to rule out adrenal insufficiency.  TSH normal.  Acute on chronic heart failure with preserved ejection fraction (HFpEF) (HCC) Acute respiratory failure with hypoxia (HCC) Demand ischemia  Echocardiogram results noted ejection fraction 50 to 55% with indeterminate diastolic function.   Continue torsemide, reduced to 20 mg daily.  No longer has volume overload.   Acute kidney injury (HCC)-resolved as of 01/18/2023 Hyponatremia. Secondary to blood loss anemia.  Continue to follow sodium level.   Cirrhosis, alcoholic Alcohol use disorder Improved, completed a course of Rocephin.   Cocaine dependence with cocaine-induced disorder ([place]) Tobacco abuse Advised to quit.   Morbid obesity ([place]) Meets criteria with BMI greater than 35 and comorbidities of CAD, CHF   Mesenteric artery stenosis (HCC) S/p stent 10/2022      Subjective:  Patient is still complaining of fatigue, pain all over.  Cannot sleep at night.  Seroquel started.  Physical Exam: Vitals:   01/26/23 1545  01/26/23 1935 01/27/23 0300 01/27/23 0750  BP: (!) 98/54 114/75 (!) 95/59 (!) 87/45  Pulse: 75 83 84 71  Resp: 20 20 20 18  $ Temp: 98.7 F (37.1 C) 98.4 F (36.9 C) 98.7 F (37.1 C) 98.1 F (36.7 C)  TempSrc:    Oral  SpO2: 96% 95% 97% 95%  Weight:      Height:       General exam: Appears calm and comfortable  Respiratory system: Clear to auscultation. Respiratory effort normal. Cardiovascular system: S1 & S2 heard, RRR. No JVD, murmurs, rubs, gallops or clicks. No pedal edema. Gastrointestinal system: Abdomen is nondistended, soft and nontender. No organomegaly or masses felt. Normal bowel sounds heard. Central nervous system: Alert and oriented. No focal neurological deficits. Extremities: Symmetric 5 x 5 power. Skin: No rashes, lesions or ulcers Psychiatry: Judgement and insight appear normal. Mood & affect appropriate.    Data Reviewed:  Lab results reviewed again.  Family Communication: None  Disposition: Status is: Inpatient Remains inpatient appropriate because: Unsafe discharge, pending nursing home placement.     Time spent: 35 minutes  Author: Sharen Hones, MD 01/27/2023 11:02 AM  For on call review [email or web address].

## 2023-01-28 DIAGNOSIS — D62 Acute posthemorrhagic anemia: Secondary | ICD-10-CM | POA: Diagnosis not present

## 2023-01-28 DIAGNOSIS — I9589 Other hypotension: Secondary | ICD-10-CM | POA: Diagnosis not present

## 2023-01-28 DIAGNOSIS — R578 Other shock: Secondary | ICD-10-CM | POA: Diagnosis not present

## 2023-01-28 DIAGNOSIS — E271 Primary adrenocortical insufficiency: Secondary | ICD-10-CM

## 2023-01-28 LAB — CBC
HCT: 34.3 % — ABNORMAL LOW (ref 39.0–52.0)
Hemoglobin: 10.9 g/dL — ABNORMAL LOW (ref 13.0–17.0)
MCH: 27.9 pg (ref 26.0–34.0)
MCHC: 31.8 g/dL (ref 30.0–36.0)
MCV: 87.9 fL (ref 80.0–100.0)
Platelets: 170 10*3/uL (ref 150–400)
RBC: 3.9 MIL/uL — ABNORMAL LOW (ref 4.22–5.81)
RDW: 19.5 % — ABNORMAL HIGH (ref 11.5–15.5)
WBC: 7.4 10*3/uL (ref 4.0–10.5)
nRBC: 0 % (ref 0.0–0.2)

## 2023-01-28 LAB — BASIC METABOLIC PANEL
Anion gap: 9 (ref 5–15)
BUN: 15 mg/dL (ref 6–20)
CO2: 27 mmol/L (ref 22–32)
Calcium: 8.9 mg/dL (ref 8.9–10.3)
Chloride: 98 mmol/L (ref 98–111)
Creatinine, Ser: 1.07 mg/dL (ref 0.61–1.24)
GFR, Estimated: >60 mL/min (ref >60)
Glucose, Bld: 106 mg/dL — ABNORMAL HIGH (ref 70–99)
Potassium: 3.3 mmol/L — ABNORMAL LOW (ref 3.5–5.1)
Sodium: 134 mmol/L — ABNORMAL LOW (ref 135–145)

## 2023-01-28 LAB — MAGNESIUM: Magnesium: 1.7 mg/dL (ref 1.7–2.4)

## 2023-01-28 LAB — PHOSPHORUS: Phosphorus: 3.5 mg/dL (ref 2.5–4.6)

## 2023-01-28 LAB — CORTISOL-AM, BLOOD: Cortisol - AM: 2.4 ug/dL — ABNORMAL LOW (ref 6.7–22.6)

## 2023-01-28 MED ORDER — POTASSIUM CHLORIDE CRYS ER 20 MEQ PO TBCR
40.0000 meq | EXTENDED_RELEASE_TABLET | ORAL | Status: AC
  Administered 2023-01-28 (×2): 40 meq via ORAL
  Filled 2023-01-28 (×2): qty 2

## 2023-01-28 MED ORDER — PREDNISONE 10 MG PO TABS
5.0000 mg | ORAL_TABLET | Freq: Two times a day (BID) | ORAL | Status: DC
  Administered 2023-01-28: 5 mg via ORAL
  Filled 2023-01-28: qty 1

## 2023-01-28 NOTE — Progress Notes (Signed)
PT Cancellation Note  Patient Details Name: SEBASTYAN PINKHASOV MRN: [id number] DOB: 1971/11/24   Cancelled Treatment:     Therapist in several times today, final return for "scheduled" time with pt, who refused again. Pt stated "I need to sleep". Pt is aware of importance in participation as well as possible d/c from services if non-compliant.    Josie Dixon 01/28/2023, 12:02 PM

## 2023-01-28 NOTE — Progress Notes (Signed)
Progress Note   [name] [id number] DOB: [birth year] DOA: 01/16/2023     11 DOS: the patient was seen and examined on 01/28/2023   Brief hospital course: 52 year old male with past medical history of morbid obesity, CAD, polysubstance abuse, mesenteric ischemia status post stent and history of recent hemorrhoidal bleeding status post hemorrhoidectomy which was complicated by recurrent bleeding requiring repeat visit to the OR and non-STEMI.  At that time, patient was transfused 1 unit packed red blood cell transfusion.  Patient presented back to the emergency room on the night of 2/1 with complaints of lower extremity swelling and dyspnea on exertion.  Patient found to have acute kidney injury, hypotension with systolic blood pressure in the 80s tachypnea and hemoglobin of 6.3.  Vascular imaging noted active rectal bleeding and patient admitted for hemorrhagic shock with secondary blood loss anemia.  Also felt to have high-output heart failure.  Transfused 2 units packed red blood cells plus blood on IV fluids.  Patient also had elevated troponins which peaked as high as the 400s.  General surgery, gastroenterology and cardiology consulted. Patient was also placed on IV Lasix after initial blood transfusion. 2/9.  Patient condition improved, diuretics switched to oral.  Pending nursing placement.  Assessment and Plan: Hemorrhagic shock (HCC)-resolved as of 01/18/2023 Secondary to acute on chronic blood loss anemia/symptomatic anemia Hematochezia, suspect secondary to hemorrhoidal bleeding History of hemorrhoidectomy November 2023 Acute blood loss anemia. Patient has a history of bleeding hemorrhoids requiring hemorrhoidectomy in November [id number] complicated by rebleed and now reports bleeding with each BM CTA showing "Findings suspicious for small active bleed in the rectum".  Patient has been seen by GI and general surgery, deferred for further workup.   Patient condition seem  to be improved, no additional bleeding.      Essential hypertension Hypotension. Adrenal insufficiency. Patient is not on any blood pressure medicine, intermittent hypotension still present.  Patient was taking midodrine.  A.m. cortisol level 2.4, patient has adrenal insufficiency could be responsible for hypotension.  Started low-dose prednisone. May gradually wean off midodrine when blood pressure is better.   Acute on chronic heart failure with preserved ejection fraction (HFpEF) (HCC) Acute respiratory failure with hypoxia (HCC) Demand ischemia  Echocardiogram results noted ejection fraction 50 to 55% with indeterminate diastolic function.   Continue torsemide, reduced to 20 mg daily.  No longer has volume overload.   Acute kidney injury (HCC)-resolved as of 01/18/2023 Hyponatremia. Hypokalemia. Secondary to blood loss anemia.  Sodium level is improving, could be due to adrenal insufficiency. Replete potassium again.   Cirrhosis, alcoholic Alcohol use disorder Improved, completed a course of Rocephin.   Cocaine dependence with cocaine-induced disorder ([place]) Tobacco abuse Advised to quit.   Morbid obesity ([place]) Meets criteria with BMI greater than 35 and comorbidities of CAD, CHF   Mesenteric artery stenosis (HCC) S/p stent 10/2022      Subjective:  Patient slept better after taking Seroquel.  Short of breath has improved.  Physical Exam: Vitals:   01/27/23 1616 01/27/23 2023 01/28/23 0440 01/28/23 0739  BP: (!) 108/58 (!) 115/57 95/63 (!) 86/53  Pulse: 70 71 88 85  Resp: 18 15 15 18  $ Temp: 98 F (36.7 C) 98 F (36.7 C) 98.3 F (36.8 C) 98.7 F (37.1 C)  TempSrc:  Oral Oral Oral  SpO2: 96% 96% 95% 96%  Weight:      Height:       General exam: Appears calm and comfortable  Respiratory system: Clear  to auscultation. Respiratory effort normal. Cardiovascular system: S1 & S2 heard, RRR. No JVD, murmurs, rubs, gallops or clicks. No pedal edema. Gastrointestinal  system: Abdomen is nondistended, soft and nontender. No organomegaly or masses felt. Normal bowel sounds heard. Central nervous system: Alert and oriented. No focal neurological deficits. Extremities: Symmetric 5 x 5 power. Skin: No rashes, lesions or ulcers Psychiatry: Judgement and insight appear normal. Mood & affect appropriate.   Data Reviewed:  Lab results reviewed.  Family Communication: None  Disposition: Status is: Inpatient Remains inpatient appropriate because: Unsafe discharge, pending nursing placement.  Planned Discharge Destination: Skilled nursing facility    Time spent: 35 minutes  Author: Sharen Hones, MD 01/28/2023 11:45 AM  For on call review [email or web address].

## 2023-01-28 NOTE — TOC Progression Note (Signed)
Transition of Care [facility]) - Progression Note    Patient Details  Name: Ethan Wells MRN: [id number] Date of Birth: 01/26/1971  Transition of Care [facility]) CM/SW [place], LCSW Phone Number: 01/28/2023, 12:03 PM  Clinical Narrative:  Notified patient that there are no bed offers so far and he may have to go home once discharged. Encouraged him to work with therapy, mobility techs, and RN every time they come by to walk with him.   Expected Discharge Plan:  (tbd) Barriers to Discharge: Continued Medical Work up  Expected Discharge Plan and Services       Living arrangements for the past 2 months: [place] Determinants of Health (SDOH) Interventions SDOH Screenings   Food Insecurity: No Food Insecurity (01/19/2023)  Recent Concern: Food Insecurity - Food Insecurity Present (11/19/2022)  Housing: Low Risk  (01/19/2023)  Recent Concern: Housing - High Risk (11/19/2022)  Transportation Needs: No Transportation Needs (01/19/2023)  Utilities: Not At Risk (01/19/2023)  Alcohol Screen: Medium Risk (10/01/2022)  Tobacco Use: High Risk (01/18/2023)    Readmission Risk Interventions    01/17/2023    3:38 PM 10/22/2022   10:47 AM  Readmission Risk Prevention Plan  Transportation Screening Complete Complete  PCP or Specialist Appt within 3-5 Days  Complete  Social Work Consult for [place] Planning/Counseling  [place]  Not Applicable  Medication Review Press photographer) Complete Complete  SW Recovery Care/Counseling Consult Not Complete   SW Consult Not Complete Comments lethargic   Palliative Care Screening Not [place] Not Applicable

## 2023-01-28 NOTE — Plan of Care (Signed)

## 2023-01-29 DIAGNOSIS — R578 Other shock: Secondary | ICD-10-CM | POA: Diagnosis not present

## 2023-01-29 DIAGNOSIS — I5A Non-ischemic myocardial injury (non-traumatic): Secondary | ICD-10-CM | POA: Insufficient documentation

## 2023-01-29 DIAGNOSIS — E876 Hypokalemia: Secondary | ICD-10-CM

## 2023-01-29 DIAGNOSIS — I951 Orthostatic hypotension: Secondary | ICD-10-CM

## 2023-01-29 DIAGNOSIS — D62 Acute posthemorrhagic anemia: Secondary | ICD-10-CM | POA: Diagnosis not present

## 2023-01-29 DIAGNOSIS — E271 Primary adrenocortical insufficiency: Secondary | ICD-10-CM | POA: Diagnosis not present

## 2023-01-29 DIAGNOSIS — I5033 Acute on chronic diastolic (congestive) heart failure: Secondary | ICD-10-CM | POA: Diagnosis not present

## 2023-01-29 DIAGNOSIS — E871 Hypo-osmolality and hyponatremia: Secondary | ICD-10-CM

## 2023-01-29 LAB — BASIC METABOLIC PANEL
Anion gap: 10 (ref 5–15)
BUN: 17 mg/dL (ref 6–20)
CO2: 24 mmol/L (ref 22–32)
Calcium: 9 mg/dL (ref 8.9–10.3)
Chloride: 101 mmol/L (ref 98–111)
Creatinine, Ser: 1.07 mg/dL (ref 0.61–1.24)
GFR, Estimated: >60 mL/min (ref >60)
Glucose, Bld: 152 mg/dL — ABNORMAL HIGH (ref 70–99)
Potassium: 3.8 mmol/L (ref 3.5–5.1)
Sodium: 135 mmol/L (ref 135–145)

## 2023-01-29 LAB — CBC
HCT: 33.6 % — ABNORMAL LOW (ref 39.0–52.0)
Hemoglobin: 10.7 g/dL — ABNORMAL LOW (ref 13.0–17.0)
MCH: 28 pg (ref 26.0–34.0)
MCHC: 31.8 g/dL (ref 30.0–36.0)
MCV: 88 fL (ref 80.0–100.0)
Platelets: 158 10*3/uL (ref 150–400)
RBC: 3.82 MIL/uL — ABNORMAL LOW (ref 4.22–5.81)
RDW: 19.6 % — ABNORMAL HIGH (ref 11.5–15.5)
WBC: 8.2 10*3/uL (ref 4.0–10.5)
nRBC: 0 % (ref 0.0–0.2)

## 2023-01-29 LAB — MAGNESIUM: Magnesium: 2.1 mg/dL (ref 1.7–2.4)

## 2023-01-29 MED ORDER — HYDROCORTISONE 10 MG PO TABS
10.0000 mg | ORAL_TABLET | Freq: Every day | ORAL | Status: DC
  Administered 2023-01-29 – 2023-01-30 (×2): 10 mg via ORAL
  Filled 2023-01-29 (×3): qty 1

## 2023-01-29 MED ORDER — HYDROCORTISONE 10 MG PO TABS
20.0000 mg | ORAL_TABLET | Freq: Every day | ORAL | Status: DC
  Administered 2023-01-29 – 2023-01-31 (×3): 20 mg via ORAL
  Filled 2023-01-29 (×3): qty 2

## 2023-01-29 NOTE — Assessment & Plan Note (Signed)
Elevated troponin secondary to demand ischemia

## 2023-01-29 NOTE — Assessment & Plan Note (Signed)
Patient on midodrine and also Cortef at this point.  Check orthostatics.

## 2023-01-29 NOTE — Assessment & Plan Note (Signed)
Platelet count low normal range

## 2023-01-29 NOTE — Assessment & Plan Note (Signed)
Start Cortef 20 mg in the morning and 10 mg in the afternoon.

## 2023-01-29 NOTE — Assessment & Plan Note (Signed)
Treated during hospital course.

## 2023-01-29 NOTE — Progress Notes (Signed)
Mobility Specialist - Progress Note   01/29/23 1456  Mobility  Activity Dangled on edge of bed (bed level exercises)  Level of Assistance Independent  Assistive Device None  Range of Motion/Exercises Active;Right leg;Left leg  Activity Response Tolerated well  $Mobility charge 1 Mobility   Pt supine upon entry, utilizing RA. Pt denied OOB to this date due to being "tired and feeling weak". Pt acceptable to EOB exercises. Pt transferred EOB indep, and completed bed level exercises. Pt performed leg raises (x4), ankle circles and pumps(x8) all tolerated well. Pt returned supine, left with needs within reach.   [place] Mobility Specialist 01/29/23 3:00 PM

## 2023-01-29 NOTE — Progress Notes (Signed)
Progress Note   [name] [id number] DOB: Sep 04, 1971 DOA: 01/16/2023     12 DOS: the patient was seen and examined on 01/29/2023   Brief hospital course: 52 year old male with past medical history of morbid obesity, CAD, polysubstance abuse, mesenteric ischemia status post stent and history of recent hemorrhoidal bleeding status post hemorrhoidectomy which was complicated by recurrent bleeding requiring repeat visit to the OR and non-STEMI.  At that time, patient was transfused 1 unit packed red blood cell transfusion.  Patient presented back to the emergency room on the night of 2/1 with complaints of lower extremity swelling and dyspnea on exertion.  Patient found to have acute kidney injury, hypotension with systolic blood pressure in the 80s tachypnea and hemoglobin of 6.3.  Vascular imaging noted active rectal bleeding and patient admitted for hemorrhagic shock with secondary blood loss anemia.  Also felt to have high-output heart failure.  Transfused 2 units packed red blood cells plus blood on IV fluids.  Patient also had elevated troponins which peaked as high as the 400s.  General surgery, gastroenterology and cardiology consulted. Patient was also placed on IV Lasix after initial blood transfusion. 2/9.  Patient condition improved, diuretics switched to oral.  Pending nursing placement.  2/14.  Asking nursing staff to get a peripheral IV so we can take out the PICC line.  Started on Cortef for adrenal insufficiency.  On midodrine for hypotension.  Notified by TOC that we are having trouble finding a rehab bed and likely will have to go home.  Assessment and Plan: * Acute blood loss anemia Secondary to hemorrhoidal bleeding.  An ongoing problem.  General surgery recommending no surgical intervention at this time.  Gastroenterology consulted recommending blood transfusion keeping hemoglobin above 7-8.  Avoid NSAIDs.  Last hemoglobin 10.7.  Received 5 units of blood 2  units of FFP and 1 unit of platelets.  Hemorrhagic shock (HCC)-resolved as of 01/18/2023 Secondary to acute on chronic blood loss anemia/symptomatic anemia Hematochezia, suspect secondary to hemorrhoidal bleeding History of hemorrhoidectomy November 2023 Patient has a history of bleeding hemorrhoids requiring hemorrhoidectomy in November [id number] complicated by rebleed and now reports bleeding with each BM    Adrenal insufficiency (Addison's disease) ([place]) Start Cortef 20 mg in the morning and 10 mg in the afternoon.  Acute on chronic heart failure with preserved ejection fraction (HFpEF) ([place]) Suspect high-output heart failure from ongoing blood loss.,  Echocardiogram results noted ejection fraction 50 to 55% with indeterminate diastolic function.  Diuresed earlier in the hospital course.  Acute respiratory failure with hypoxia (HCC) Secondary to CHF from fluid resuscitation and blood given.  Resolved.  Now on room air  Hypokalemia Treated during hospital course.  Acute kidney injury (HCC)-resolved as of 01/18/2023 Secondary to blood loss anemia.  Improving with IV fluids and blood transfusion  Essential hypertension Blood pressure medications on hold due to hypotension from blood loss.  Off of pressor support, have added midodrine.  Cocaine dependence with cocaine-induced disorder (HCC) No stop cocaine  Alcohol use disorder No signs of withdrawal  Tobacco abuse Must quit smoking  Morbid obesity ([place]) Meets criteria with BMI greater than 35 and comorbidities of CAD, CHF  Mesenteric artery stenosis (HCC) S/p stent 10/2022  Myocardial injury Elevated troponin secondary to demand ischemia  Reactive thrombocytosis Platelet count low normal range  Orthostatic hypotension Patient on midodrine and also Cortef at this point  Hypomagnesemia Treated during the hospital course  Hyponatremia Last sodium normal range  Cocaine abuse ([place])  UDS positive for cocaine         Subjective: Patient feels okay.  Offers no complaints.  Initially admitted with hemorrhagic shock  Physical Exam: Vitals:   01/28/23 2000 01/29/23 0335 01/29/23 0807 01/29/23 1234  BP: (!) 112/58 (!) 87/44 (!) 96/57 107/67  Pulse: 67 78 82 77  Resp: 14 14 20   $ Temp: 97.6 F (36.4 C) 98.2 F (36.8 C) 98.6 F (37 C)   TempSrc: Oral Oral    SpO2: 95% 96% 97%   Weight:      Height:       Physical Exam HENT:     Head: Normocephalic.     Mouth/Throat:     Pharynx: No oropharyngeal exudate.  Eyes:     General: Lids are normal.     Conjunctiva/sclera: Conjunctivae normal.  Cardiovascular:     Rate and Rhythm: Normal rate and regular rhythm.     Heart sounds: Normal heart sounds, S1 normal and S2 normal.  Pulmonary:     Breath sounds: No decreased breath sounds, wheezing, rhonchi or rales.  Abdominal:     Palpations: Abdomen is soft.     Tenderness: There is no abdominal tenderness.  Musculoskeletal:     Right lower leg: No swelling.     Left lower leg: No swelling.  Skin:    General: Skin is warm.     Findings: No rash.  Neurological:     Mental Status: He is alert and oriented to person, place, and time.     Data Reviewed: Last hemoglobin 10.7, creatinine 1.07, sodium 135, potassium 3.8, magnesium 2.1   Disposition: Status is: Inpatient Remains inpatient appropriate because: Medically stable for rehab but we do not have a rehab bed.  Patient will have to get stronger while here.  Planned Discharge Destination: Home with Home Health    Time spent: 28 minutes  Author: Loletha Grayer, MD 01/29/2023 1:12 PM  For on call review [email or web address].

## 2023-01-29 NOTE — Assessment & Plan Note (Signed)
Treated during the hospital course

## 2023-01-29 NOTE — Assessment & Plan Note (Signed)
Last sodium normal range 

## 2023-01-30 DIAGNOSIS — R578 Other shock: Secondary | ICD-10-CM | POA: Diagnosis not present

## 2023-01-30 DIAGNOSIS — E271 Primary adrenocortical insufficiency: Secondary | ICD-10-CM | POA: Diagnosis not present

## 2023-01-30 DIAGNOSIS — I5033 Acute on chronic diastolic (congestive) heart failure: Secondary | ICD-10-CM | POA: Diagnosis not present

## 2023-01-30 DIAGNOSIS — I10 Essential (primary) hypertension: Secondary | ICD-10-CM

## 2023-01-30 DIAGNOSIS — D75838 Other thrombocytosis: Secondary | ICD-10-CM

## 2023-01-30 DIAGNOSIS — D62 Acute posthemorrhagic anemia: Secondary | ICD-10-CM | POA: Diagnosis not present

## 2023-01-30 NOTE — Progress Notes (Signed)
Progress Note   [name] [id number] DOB: [birth year] DOA: 01/16/2023     13 DOS: the patient was seen and examined on 01/30/2023   Brief hospital course: 52 year old male with past medical history of morbid obesity, CAD, polysubstance abuse, mesenteric ischemia status post stent and history of recent hemorrhoidal bleeding status post hemorrhoidectomy which was complicated by recurrent bleeding requiring repeat visit to the OR and non-STEMI.  At that time, patient was transfused 1 unit packed red blood cell transfusion.  Patient presented back to the emergency room on the night of 2/1 with complaints of lower extremity swelling and dyspnea on exertion.  Patient found to have acute kidney injury, hypotension with systolic blood pressure in the 80s tachypnea and hemoglobin of 6.3.  Vascular imaging noted active rectal bleeding and patient admitted for hemorrhagic shock with secondary blood loss anemia.  Also felt to have high-output heart failure.  Transfused 2 units packed red blood cells plus blood on IV fluids.  Patient also had elevated troponins which peaked as high as the 400s.  General surgery, gastroenterology and cardiology consulted. Patient was also placed on IV Lasix after initial blood transfusion. 2/9.  Patient condition improved, diuretics switched to oral.  Pending nursing placement.  2/14.  Asking nursing staff to get a peripheral IV so we can take out the PICC line.  Started on Cortef for adrenal insufficiency.  On midodrine for hypotension.  Notified by TOC that we are having trouble finding a rehab bed and likely will have to go home.  Assessment and Plan: * Acute blood loss anemia Secondary to hemorrhoidal bleeding.  An ongoing problem.  General surgery recommending no surgical intervention at this time.  Gastroenterology consulted recommending blood transfusion keeping hemoglobin above 7-8.  Avoid NSAIDs.  Last hemoglobin 10.7.  Received 5 units of blood 2  units of FFP and 1 unit of platelets.  Hemorrhagic shock (HCC)-resolved as of 01/18/2023 Secondary to acute on chronic blood loss anemia/symptomatic anemia Hematochezia, suspect secondary to hemorrhoidal bleeding History of hemorrhoidectomy November 2023 Patient has a history of bleeding hemorrhoids requiring hemorrhoidectomy in November [id number] complicated by rebleed and now reports bleeding with each BM CTA showing "Findings suspicious for small active bleed in the rectum".   Adrenal insufficiency (Addison's disease) (HCC) Continue Cortef 20 mg in the morning and 10 mg in the afternoon.  Acute on chronic heart failure with preserved ejection fraction (HFpEF) ([place]) Suspect high-output heart failure from ongoing blood loss.,  Echocardiogram results noted ejection fraction 50 to 55% with indeterminate diastolic function.  Diuresed earlier in the hospital course.  Acute respiratory failure with hypoxia (HCC) Secondary to CHF from fluid resuscitation and blood given.  Resolved.  Now on room air  Hypokalemia Treated during hospital course.  Acute kidney injury (HCC)-resolved as of 01/18/2023 Secondary to blood loss anemia.  Improving with IV fluids and blood transfusion  Essential hypertension Blood pressure medications on hold due to hypotension from blood loss.  Off of pressor support, have added midodrine.  Cocaine dependence with cocaine-induced disorder ([place]) Will counsel.  Drug screen negative.  Able to proceed with procedures  Alcohol use disorder CIWA withdrawal protocol  Tobacco abuse Will counsel to quit  Morbid obesity ([place]) Meets criteria with BMI greater than 35 and comorbidities of CAD, CHF  Mesenteric artery stenosis (HCC) S/p stent 10/2022  Myocardial injury Elevated troponin secondary to demand ischemia  Reactive thrombocytosis Platelet count low normal range  Orthostatic hypotension Patient on midodrine and also Cortef at  this point.  Check  orthostatics.  Hypomagnesemia Treated during the hospital course  Hyponatremia Last sodium normal range  Cocaine abuse (HCC) UDS positive for cocaine        Subjective: Patient feeling okay.  Still feeling weak.  Advised to participate with moving around with anybody that comes in the room.  Physical Exam: Vitals:   01/29/23 1617 01/29/23 1946 01/30/23 0533 01/30/23 0739  BP: (!) 99/49 113/62 (!) 95/46 102/60  Pulse: 77 77 78 80  Resp: 18 18 18 20  $ Temp: 97.8 F (36.6 C) 97.6 F (36.4 C) 98.2 F (36.8 C) (!) 97.5 F (36.4 C)  TempSrc:      SpO2: 98% 93% 97% 99%  Weight:      Height:       Physical Exam HENT:     Head: Normocephalic.     Mouth/Throat:     Pharynx: No oropharyngeal exudate.  Eyes:     General: Lids are normal.     Conjunctiva/sclera: Conjunctivae normal.  Cardiovascular:     Rate and Rhythm: Normal rate and regular rhythm.     Heart sounds: Normal heart sounds, S1 normal and S2 normal.  Pulmonary:     Breath sounds: No decreased breath sounds, wheezing, rhonchi or rales.  Abdominal:     Palpations: Abdomen is soft.     Tenderness: There is no abdominal tenderness.  Musculoskeletal:     Right lower leg: No swelling.     Left lower leg: No swelling.  Skin:    General: Skin is warm.     Findings: No rash.  Neurological:     Mental Status: He is alert and oriented to person, place, and time.     Data Reviewed: Sodium 135, creatinine 1.07, hemoglobin 10.7   Disposition: Status is: Inpatient Remains inpatient appropriate because: No rehab bed offers at this point.  Will end up having to go home once strong enough.  Advised to keep working with any staff that comes in the room with standing up and moving around.  Planned Discharge Destination: Home    Time spent: 27 minutes  Author: Loletha Grayer, MD 01/30/2023 2:59 PM  For on call review [email or web address].

## 2023-01-30 NOTE — Plan of Care (Signed)
  Problem: Education: Goal: Understanding of cardiac disease, CV risk reduction, and recovery process will improve Outcome: Progressing Goal: Individualized Educational Video(s) Outcome: Progressing   Problem: Activity: Goal: Ability to tolerate increased activity will improve Outcome: Progressing   Problem: Cardiac: Goal: Ability to achieve and maintain adequate cardiovascular perfusion will improve Outcome: Progressing

## 2023-01-30 NOTE — Care Management Important Message (Signed)
Important Message  Patient Details  Name: Ethan Wells MRN: [id number] Date of Birth: 06-30-1971   Medicare Important Message Given:  Yes     [name] 01/30/2023, 2:53 PM

## 2023-01-30 NOTE — Progress Notes (Signed)
Mobility Specialist - Progress Note    01/30/23 1551  Mobility  Activity Refused mobility   Pt sitting up in bed upon entry, utilizing RA. Pt denied OOB amb to this date, stating "I just worked with someone". Pt left supine with needs within reach.   [place]ie [place] Mobility Specialist 01/30/23 3:58 PM

## 2023-01-31 DIAGNOSIS — D62 Acute posthemorrhagic anemia: Secondary | ICD-10-CM | POA: Diagnosis not present

## 2023-01-31 MED ORDER — VITAMIN B-1 100 MG PO TABS: 100.0000 mg | ORAL_TABLET | Freq: Every day | ORAL | 0 refills | Status: DC

## 2023-01-31 MED ORDER — FOLIC ACID 1 MG PO TABS: 1.0000 mg | ORAL_TABLET | Freq: Every day | ORAL | 0 refills | Status: DC

## 2023-01-31 MED ORDER — HYDROCORTISONE 20 MG PO TABS: 20.0000 mg | ORAL_TABLET | Freq: Every day | ORAL | 0 refills | Status: DC

## 2023-01-31 MED ORDER — OXYCODONE HCL 10 MG PO TABS: 10.0000 mg | ORAL_TABLET | Freq: Three times a day (TID) | ORAL | 0 refills | Status: DC | PRN

## 2023-01-31 MED ORDER — FERROUS SULFATE 325 (65 FE) MG PO TABS: 325.0000 mg | ORAL_TABLET | Freq: Every day | ORAL | 1 refills | Status: DC

## 2023-01-31 MED ORDER — HYDROCORTISONE 10 MG PO TABS: 10.0000 mg | ORAL_TABLET | Freq: Every day | ORAL | 0 refills | Status: DC

## 2023-01-31 MED ORDER — TORSEMIDE 20 MG PO TABS: 20.0000 mg | ORAL_TABLET | Freq: Every day | ORAL | 0 refills | Status: DC

## 2023-01-31 MED ORDER — QUETIAPINE FUMARATE 25 MG PO TABS: 25.0000 mg | ORAL_TABLET | Freq: Every day | ORAL | 0 refills | Status: DC

## 2023-01-31 MED ORDER — MIDODRINE HCL 10 MG PO TABS: 10.0000 mg | ORAL_TABLET | Freq: Three times a day (TID) | ORAL | 0 refills | Status: DC

## 2023-01-31 NOTE — Plan of Care (Signed)

## 2023-01-31 NOTE — Progress Notes (Cosign Needed)
Patient is not able to walk the distance required to go the bathroom, or he/she is unable to safely negotiate stairs required to access the bathroom.  A BSC will alleviate this problem. 

## 2023-01-31 NOTE — TOC Transition Note (Addendum)
Transition of Care [facility]) - Progression Note    Patient Details  Name: Ethan Wells MRN: [id number] Date of Birth: February 14, 1971  Transition of Care [facility] CM/SW Contact  Laurena Slimmer, RN Phone Number: 01/31/2023, 11:46 AM  Clinical Narrative:    Discharge order placed by MD. [facility] requested.  Patient will get a 4 wheeled RW from a thrift store.  He is trying to arrange his transportation but may need a taxi.  4:23pm  Patient ride unable to come.He does not have a ride and unable to pay. Taxi arranged.   TOC signing off      Expected Discharge Plan:  (tbd) Barriers to Discharge: Continued Medical Work up  Expected Discharge Plan and Services       Living arrangements for the past 2 months: [place] Expected Discharge Date: 01/31/23                                     Social Determinants of Health ([place]) Interventions SDOH Screenings   Food Insecurity: No Food Insecurity (01/19/2023)  Recent Concern: Food Insecurity - Food Insecurity Present (11/19/2022)  Housing: Low Risk  (01/19/2023)  Recent Concern: Housing - High Risk (11/19/2022)  Transportation Needs: No Transportation Needs (01/19/2023)  Utilities: Not At Risk (01/19/2023)  Alcohol Screen: Medium Risk (10/01/2022)  Tobacco Use: High Risk (01/18/2023)    Readmission Risk Interventions    01/17/2023    3:38 PM 10/22/2022   10:47 AM  Readmission Risk Prevention Plan  Transportation Screening Complete Complete  PCP or Specialist Appt within 3-5 Days  Complete  Social Work Consult for [place] Planning/Counseling  [place]  Not Applicable  Medication Review [employer] photographer) Complete Complete  SW Recovery Care/Counseling Consult Not Complete   SW Consult Not Complete Comments lethargic   Palliative Care Screening Not [place] Not Applicable

## 2023-01-31 NOTE — Progress Notes (Signed)
Bedside commode delivered to patient.

## 2023-01-31 NOTE — Progress Notes (Signed)
AVS reviewed with patient, IV removed. Patient waiting on ride to dc home.

## 2023-01-31 NOTE — Discharge Summary (Signed)
Physician Discharge Summary   [name] MRN: [id number] DOB: [birth year]  Admit date:     01/16/2023  Discharge date: 01/31/23  Discharge Physician: Fritzi Mandes   PCP: Nickola Major, NP   Recommendations at discharge:    F/u Dr Esmond Plants [facility] cardiology in 1 week F/u PC in 1-2 weeks  Discharge Diagnoses: Principal Problem:   Acute blood loss anemia Active Problems:   Hematochezia   Hemorrhoids, internal, with bleeding   Adrenal insufficiency (Addison's disease) ([place])   Acute on chronic heart failure with preserved ejection fraction (HFpEF) ([place])   Anasarca   Hypokalemia   Acute respiratory failure with hypoxia (HCC)   Essential hypertension   CAD (coronary artery disease)   Cocaine dependence with cocaine-induced disorder ([place])   Alcohol use disorder   Tobacco abuse   Mesenteric artery stenosis (HCC)   Morbid obesity (HCC)   Hyponatremia   Hypomagnesemia   Orthostatic hypotension   Rectal bleeding   Peripheral edema   History of benzodiazepine use   Hypophosphatemia   Reactive thrombocytosis   Myocardial injury  Resolved Problems:   Hemorrhagic shock (HCC)   Acute kidney injury [facility])  Hospital Course: 52 year old male with past medical history of morbid obesity, CAD, polysubstance abuse, mesenteric ischemia status post stent and history of recent hemorrhoidal bleeding status post hemorrhoidectomy which was complicated by recurrent bleeding requiring repeat visit to the OR and non-STEMI. At that time, patient was transfused 1 unit packed red blood cell transfusion.   Acute blood loss anemia Secondary to hemorrhoidal bleeding.  An ongoing problem.  General surgery recommending no surgical intervention at this time.  Gastroenterology consulted recommending blood transfusion keeping hemoglobin above 7-8.  Avoid NSAIDs.  Last hemoglobin 10.7.  Received 5 units of blood 2 units of FFP and 1 unit of platelets.   Hemorrhagic shock (HCC)-resolved  as of 01/18/2023 Secondary to acute on chronic blood loss anemia/symptomatic anemia Hematochezia, suspect secondary to hemorrhoidal bleeding History of hemorrhoidectomy November 2023 Patient has a history of bleeding hemorrhoids requiring hemorrhoidectomy in November [id number] complicated by rebleed and now reports bleeding with each BM CTA showing "Findings suspicious for small active bleed in the rectum".  --pt advised bowel regimen at home --hgb stable at 10.0   Adrenal insufficiency (Addison's disease) (HCC) Continue Cortef 20 mg in the morning and 10 mg in the afternoon.   Acute on chronic heart failure with preserved ejection fraction (HFpEF) ([place]) Suspect high-output heart failure from ongoing blood loss.,  Echocardiogram results noted ejection fraction 50 to 55% with indeterminate diastolic function.  Diuresed earlier in the hospital course.pt will f/u Dr Rockey Situ as out pt   Acute respiratory failure with hypoxia ([place]) Secondary to CHF from fluid resuscitation and blood given.  Resolved.  Now on room air   Hypokalemia Treated during hospital course.   Acute kidney injury (HCC)-resolved as of 01/18/2023 Secondary to blood loss anemia.  Improving with IV fluids and blood transfusion   Essential hypertension Blood pressure medications on hold due to hypotension from blood loss.  Off of pressor support, have added midodrine.   Cocaine dependence with cocaine-induced disorder ([place])  Drug screen negative.     Alcohol use disorder CIWA withdrawal protocol--no s/o wd   Tobacco abuse  counselled to quit   Morbid obesity ([place]) Meets criteria with BMI greater than 35 and comorbidities of CAD, CHF   Mesenteric artery stenosis (HCC) S/p stent 10/2022   Myocardial injury Elevated troponin secondary to demand ischemia  Reactive thrombocytosis Platelet count low normal range   Orthostatic hypotension Patient on midodrine and also Cortef at this point.   No issues with bp. Ambulating  in the room by himself   Hypomagnesemia Treated during the hospital course   Hyponatremia Last sodium normal range  Overall best at baseline. D/c home. Pt was quiet irritable with me and did not want me to discuss his discharge instructions.      Pain control - [employer] Controlled Substance Reporting System database was reviewed. and patient was instructed, not to drive, operate heavy machinery, perform activities at heights, swimming or participation in water activities or provide baby-sitting services while on Pain, Sleep and Anxiety Medications; until their outpatient Physician has advised to do so again. Also recommended to not to take more than prescribed Pain, Sleep and Anxiety Medications.  Consultants: Cardiology, gen surgery ,GI Disposition: Home Diet recommendation:  Discharge Diet Orders (From admission, onward)     Start     Ordered   01/31/23 0000  Diet - low sodium heart healthy        01/31/23 1131           Cardiac diet DISCHARGE MEDICATION: Allergies as of 01/31/2023       Reactions   Penicillins Hives   Tolerated Ceftriaxone 10/2022   Other Rash   chives        Medication List     STOP taking these medications    aspirin 81 MG chewable tablet   calcium-vitamin D 500-5 MG-MCG tablet Commonly known as: OSCAL WITH D   furosemide 40 MG tablet Commonly known as: LASIX   isosorbide mononitrate 30 MG 24 hr tablet Commonly known as: IMDUR   lidocaine 5 % ointment Commonly known as: XYLOCAINE   lisinopril 5 MG tablet Commonly known as: ZESTRIL   magnesium oxide 400 (240 Mg) MG tablet Commonly known as: MAG-OX   metoprolol tartrate 25 MG tablet Commonly known as: LOPRESSOR   nicotine 14 mg/24hr patch Commonly known as: NICODERM CQ - dosed in mg/24 hours   polyethylene glycol 17 g packet Commonly known as: MIRALAX / GLYCOLAX   psyllium 95 % Pack Commonly known as: HYDROCIL/METAMUCIL       TAKE these medications     acetaminophen 500 MG tablet Commonly known as: TYLENOL Take 2 tablets (1,000 mg total) by mouth every 6 (six) hours as needed for mild pain.   ezetimibe 10 MG tablet Commonly known as: ZETIA Take 10 mg by mouth daily.   ferrous sulfate 325 (65 FE) MG tablet Take 1 tablet (325 mg total) by mouth daily with breakfast.   folic acid 1 MG tablet Commonly known as: FOLVITE Take 1 tablet (1 mg total) by mouth daily. Start taking on: February 01, 2023   gabapentin 300 MG capsule Commonly known as: NEURONTIN Take 1 capsule (300 mg total) by mouth 3 (three) times daily.   hydrocortisone 10 MG tablet Commonly known as: CORTEF Take 1 tablet (10 mg total) by mouth daily.   hydrocortisone 20 MG tablet Commonly known as: CORTEF Take 1 tablet (20 mg total) by mouth daily. Start taking on: February 01, 2023   methocarbamol 750 MG tablet Commonly known as: ROBAXIN TAKE 1 TABLET (750 MG TOTAL) BY MOUTH DAILY AS NEEDED FOR MUSCLE SPASMS   midodrine 10 MG tablet Commonly known as: PROAMATINE Take 1 tablet (10 mg total) by mouth 3 (three) times daily with meals. What changed:  medication strength how much to take  multivitamin tablet Take 1 tablet by mouth daily.   nitroGLYCERIN 0.4 MG SL tablet Commonly known as: NITROSTAT Place 1 tablet (0.4 mg total) under the tongue every 5 (five) minutes as needed for chest pain.   Oxycodone HCl 10 MG Tabs Take 1 tablet (10 mg total) by mouth every 8 (eight) hours as needed for severe pain.   pantoprazole 40 MG tablet Commonly known as: PROTONIX TAKE 1 TABLET BY MOUTH EVERY DAY What changed: when to take this   QUEtiapine 25 MG tablet Commonly known as: SEROQUEL Take 1 tablet (25 mg total) by mouth at bedtime.   rosuvastatin 40 MG tablet Commonly known as: CRESTOR TAKE 1 TABLET BY MOUTH EVERY DAY   thiamine 100 MG tablet Commonly known as: Vitamin B-1 Take 1 tablet (100 mg total) by mouth daily. Start taking on: February 01, 2023   torsemide 20 MG tablet Commonly known as: DEMADEX Take 1 tablet (20 mg total) by mouth daily. Start taking on: February 01, 2023               Durable Medical Equipment  (From admission, onward)           Start     Ordered   01/31/23 1131  DME 3-in-1  Once        01/31/23 1131   01/31/23 1131  DME Walker  Once       Question Answer Comment  Walker: With 5 Inch Wheels   Patient needs a walker to treat with the following condition Weakness      01/31/23 1131            Follow-up Information     Medina-Vargas, Monina C, NP. Go on 02/03/2023.   Specialty: Internal Medicine Why: Go at 2:40pm. Contact information: [address] [place] [address] [id number]         Minna Merritts, MD. Go on 02/04/2023.   Specialty: Cardiology Why: CHF. Go at 1:50pm. Contact information: [address] [place] [address] [phone]         Geanie Kenning, [place]. Go on 05/19/2023.   Specialty: Gastroenterology Why: If symptoms worsen. Go at 3:00pm. Contact information: [address] [place] [address] [phone]                Filed Weights   01/16/23 2305 01/17/23 0430 01/20/23 0130  Weight: 91.2 kg 101.7 kg 104.2 kg     Condition at discharge: fair  The results of significant diagnostics from this hospitalization (including imaging, microbiology, ancillary and laboratory) are listed below for reference.   Imaging Studies: DG Abd 1 View  Result Date: 01/18/2023 CLINICAL DATA:  [id number] Abdominal pain [id number] EXAM: ABDOMEN - 1 VIEW COMPARISON:  CT abdomen pelvis 01/17/2023 FINDINGS: A loop of small bowel within the right lower abdomen appears distended with gas up to 3 cm. No radio-opaque calculi or other significant radiographic abnormality are seen. IMPRESSION: 1. Nonspecific loop of small bowel within the right lower abdomen appears distended with gas up to 3 cm. 2. Otherwise nonobstructive bowel gas  pattern. Electronically Signed   By: Iven Finn M.D.   On: 01/18/2023 02:11   DG Chest 1 View  Result Date: 01/18/2023 CLINICAL DATA:  [id number] SOB (shortness of breath) [id number] EXAM: CHEST  1 VIEW COMPARISON:  Chest x-ray 01/16/2023. FINDINGS: Interval placement of a right PICC with tip overlying the expected region of the superior cavoatrial junction. The heart and mediastinal contours  are unchanged No focal consolidation. Chronic coarsened interstitial markings with no overt pulmonary edema. No pleural effusion. No pneumothorax. No acute osseous abnormality. IMPRESSION: 1. No active disease. 2. Interval placement of a right PICC with tip overlying the expected region of the superior cavoatrial junction. Electronically Signed   By: Iven Finn M.D.   On: 01/18/2023 02:03   [place] EKG SITE RITE  Result Date: 01/17/2023 If [facility] image not attached, placement could not be confirmed due to current cardiac rhythm.  ECHOCARDIOGRAM COMPLETE  Result Date: 01/17/2023    ECHOCARDIOGRAM REPORT   Patient Name:   Ethan Wells Date of Exam: 01/17/2023 Medical Rec #:  [id number]           Height:       65.0 in Accession #:    [id number]          Weight:       224.2 lb Date of Birth:  25-May-1971           BSA:          2.077 m Patient Age:    38 years            BP:           106/69 mmHg Patient Gender: M                   HR:           92 bpm. Exam Location:  [facility] Procedure: 2D Echo, Cardiac Doppler and Color Doppler Indications:     NSTEMI I21.4  History:         Patient has prior history of Echocardiogram examinations, most                  recent 10/30/2022. CAD; Risk Factors:Hypertension. Ischemic                  cardiomyopathy, tobacco abuse.  Sonographer:     Sherrie Sport Referring Phys:  [id number] Athena Masse Diagnosing Phys: Ida Rogue MD  Sonographer Comments: Technically challenging study due to limited acoustic windows, no apical window and no subcostal window. Image acquisition challenging due  to patient body habitus. IMPRESSIONS  1. Left ventricular ejection fraction, by estimation, is 50 to 55%. The left ventricle has low normal function. The left ventricle has no regional wall motion abnormalities. Left ventricular diastolic parameters are indeterminate.  2. Right ventricular systolic function is normal. The right ventricular size is normal.  3. The mitral valve is normal in structure. Mild mitral valve regurgitation. No evidence of mitral stenosis.  4. The aortic valve is normal in structure. Aortic valve regurgitation is not visualized. No aortic stenosis is present.  5. The inferior vena cava is normal in size with greater than 50% respiratory variability, suggesting right atrial pressure of 3 mmHg. FINDINGS  Left Ventricle: Left ventricular ejection fraction, by estimation, is 50 to 55%. The left ventricle has low normal function. The left ventricle has no regional wall motion abnormalities. The left ventricular internal cavity size was normal in size. There is no left ventricular hypertrophy. Left ventricular diastolic parameters are indeterminate. Right Ventricle: The right ventricular size is normal. No increase in right ventricular wall thickness. Right ventricular systolic function is normal. Left Atrium: Left atrial size was normal in size. Right Atrium: Right atrial size was normal in size. Pericardium: There is no evidence of pericardial effusion. Mitral Valve: The mitral valve is normal in structure. Mild mitral valve regurgitation.  No evidence of mitral valve stenosis. Tricuspid Valve: The tricuspid valve is normal in structure. Tricuspid valve regurgitation is not demonstrated. No evidence of tricuspid stenosis. Aortic Valve: The aortic valve is normal in structure. Aortic valve regurgitation is not visualized. No aortic stenosis is present. Pulmonic Valve: The pulmonic valve was normal in structure. Pulmonic valve regurgitation is not visualized. No evidence of pulmonic stenosis. Aorta:  The aortic root is normal in size and structure. Venous: The inferior vena cava is normal in size with greater than 50% respiratory variability, suggesting right atrial pressure of 3 mmHg. IAS/Shunts: No atrial level shunt detected by color flow Doppler.  LEFT VENTRICLE PLAX 2D LVIDd:         5.60 cm LVIDs:         4.35 cm LV PW:         0.95 cm LV IVS:        0.80 cm LVOT diam:     2.10 cm LVOT Area:     3.46 cm  LEFT ATRIUM         Index LA diam:    3.60 cm 1.73 cm/m   AORTA Ao Root diam: 3.00 cm  SHUNTS Systemic Diam: 2.10 cm Ida Rogue MD Electronically signed by Ida Rogue MD Signature Date/Time: 01/17/2023/3:25:48 PM    Final    US Venous Img Lower Bilateral (DVT)  Result Date: 01/17/2023 CLINICAL DATA:  CHF EXAM: BILATERAL LOWER EXTREMITY VENOUS DOPPLER ULTRASOUND TECHNIQUE: Gray-scale sonography with compression, as well as color and duplex ultrasound, were performed to evaluate the deep venous system(s) from the level of the common femoral vein through the popliteal and proximal calf veins. COMPARISON:  None Available. FINDINGS: VENOUS Normal compressibility of the common femoral, superficial femoral, and popliteal veins, as well as the visualized calf veins. Visualized portions of profunda femoral vein and great saphenous vein unremarkable. No filling defects to suggest DVT on grayscale or color Doppler imaging. Doppler waveforms show normal direction of venous flow, normal respiratory plasticity and response to augmentation. Subcutaneous edema in the bilateral lower legs. IMPRESSION: Negative for DVT in the lower extremities. Electronically Signed   By: Jorje Guild M.D.   On: 01/17/2023 06:56   CT ANGIO GI BLEED  Result Date: 01/17/2023 CLINICAL DATA:  Acute blood loss anemia with rectal bleeding EXAM: CTA ABDOMEN AND PELVIS WITHOUT AND WITH CONTRAST TECHNIQUE: Multidetector CT imaging of the abdomen and pelvis was performed using the standard protocol during bolus administration of  intravenous contrast. Multiplanar reconstructed images and MIPs were obtained and reviewed to evaluate the vascular anatomy. RADIATION DOSE REDUCTION: This exam was performed according to the departmental dose-optimization program which includes automated exposure control, adjustment of the mA and/or kV according to patient size and/or use of iterative reconstruction technique. CONTRAST:  155m OMNIPAQUE IOHEXOL 350 MG/ML SOLN COMPARISON:  None Available. FINDINGS: VASCULAR Aorta: Normal caliber aorta without aneurysm, dissection, vasculitis or significant stenosis. Scattered calcified atherosclerotic plaque. Celiac: Severe focal stenosis at the origin of the celiac artery similar to 09/30/2022. SMA: Interval placement of a stent at the origin of the SMA. The stent is patent. Renals: Both renal arteries are patent without evidence of aneurysm, dissection, vasculitis, fibromuscular dysplasia or significant stenosis. IMA: Patent without evidence of aneurysm, dissection, vasculitis or significant stenosis. Inflow: Patent without evidence of aneurysm, dissection, vasculitis or significant stenosis. Proximal Outflow: Bilateral common femoral and visualized portions of the superficial and profunda femoral arteries are patent without evidence of aneurysm, dissection, vasculitis or significant stenosis.  Veins: Patent portal vein and IVC. Review of the MIP images confirms the above findings. NON-VASCULAR Lower chest: No acute abnormality. Hepatobiliary: Cirrhosis. Nondistended gallbladder. Gallbladder wall thickening and pericholecystic fluid favored related to cirrhosis/volume status. No biliary dilation. Pancreas: Unremarkable. Spleen: The spleen measures 14.5 cm in craniocaudal dimension. There is at the upper limits of normal in size. Adrenals/Urinary Tract: Adrenal glands are unremarkable. Kidneys are normal, without renal calculi, focal lesion, or hydronephrosis. Bladder is unremarkable. Stomach/Bowel: There is a focal  hyperdensity on arterial phase images (series 11/image 1041) which diffuse is on portal venous phase and is not present on noncontrast images. This is compatible with small focus of active bleed. Hyperdense stool is present throughout the colon on noncontrast images and limits assessment for additional active bleeding. Normal caliber large and small bowel. Wall thickening about the sigmoid colon and rectum. Additional wall thickening versus underdistention of the ascending colon. Wall thickening and adjacent stranding and fluid about the duodenum. Lymphatic: No suspicious lymphadenopathy. Reproductive: Unremarkable. Other: Small amount of free fluid in the pelvis. Central mesenteric edema. No free intraperitoneal air. Musculoskeletal: No acute osseous abnormality. IMPRESSION: VASCULAR Findings suspicious for small active bleed in the rectum. Severe stenosis at the origin of the celiac axis is redemonstrated. Patent stent at the origin of the SMA. NON-VASCULAR Wall thickening about the sigmoid colon/rectum as well as the transverse colon favored due to colitis however congestive colopathy could appear similarly. Duodenal wall thickening suggesting duodenitis. Cirrhosis and borderline splenomegaly compatible with portal hypertension. These results were called by telephone at the time of interpretation on 01/17/2023 at 3:19 am to provider [facility] , who verbally acknowledged these results. Electronically Signed   By: Placido Sou M.D.   On: 01/17/2023 03:25   DG Chest Portable 1 View  Result Date: 01/16/2023 CLINICAL DATA:  Volume overload EXAM: PORTABLE CHEST 1 VIEW COMPARISON:  12/12/2022 FINDINGS: Cardiac shadow is enlarged but stable. No significant vascular congestion is noted. No edema is seen. The lungs are clear. No bony abnormality is noted. IMPRESSION: No active disease. Electronically Signed   By: Inez Catalina M.D.   On: 01/16/2023 23:42    Microbiology: Results for orders placed or performed  during the hospital encounter of 01/16/23  Blood Culture (routine x 2)     Status: None   Collection Time: 01/17/23 12:43 AM   Specimen: BLOOD LEFT ARM  Result Value Ref Range Status   Specimen Description BLOOD LEFT ARM  Final   Special Requests   Final    BOTTLES DRAWN AEROBIC AND ANAEROBIC Blood Culture adequate volume   Culture   Final    NO GROWTH 5 DAYS Performed at [facility], [address], [place], [place] [address]    Report Status 01/22/2023 FINAL  Final  Blood Culture (routine x 2)     Status: None   Collection Time: 01/17/23 12:44 AM   Specimen: BLOOD  Result Value Ref Range Status   Specimen Description BLOOD RIGHT WRIST  Final   Special Requests   Final    BOTTLES DRAWN AEROBIC AND ANAEROBIC Blood Culture adequate volume   Culture   Final    NO GROWTH 5 DAYS Performed at [facility], [address], [place], [place] [address]    Report Status 01/22/2023 FINAL  Final  MRSA Next Gen by PCR, Nasal     Status: None   Collection Time: 01/17/23  4:49 AM   Specimen: Nasal Mucosa; Nasal Swab  Result Value Ref  Range Status   MRSA by PCR Next Gen NOT DETECTED NOT DETECTED Final    Comment: (NOTE) The GeneXpert MRSA Assay (FDA approved for NASAL specimens only), is one component of a comprehensive MRSA colonization surveillance program. It is not intended to diagnose MRSA infection nor to guide or monitor treatment for MRSA infections. Test performance is not FDA approved in patients less than 54 years old. Performed at [facility], [address] [place], [place] [address]   Resp panel by RT-PCR (RSV, Flu A&B, Covid) Anterior Nasal Swab     Status: None   Collection Time: 01/17/23 12:35 PM   Specimen: Anterior Nasal Swab  Result Value Ref Range Status   SARS Coronavirus 2 by RT PCR NEGATIVE NEGATIVE Final    Comment: (NOTE) SARS-CoV-2 target nucleic acids are NOT DETECTED.  The SARS-CoV-2 RNA is generally detectable in upper  respiratory specimens during the acute phase of infection. The lowest concentration of SARS-CoV-2 viral copies this assay can detect is 138 copies/mL. A negative result does not preclude SARS-Cov-2 infection and should not be used as the sole basis for treatment or other patient management decisions. A negative result may occur with  improper specimen collection/handling, submission of specimen other than nasopharyngeal swab, presence of viral mutation(s) within the areas targeted by this assay, and inadequate number of viral copies(<138 copies/mL). A negative result must be combined with clinical observations, patient history, and epidemiological information. The expected result is Negative.  Fact Sheet for Patients:  EntrepreneurPulse.com.au  Fact Sheet for Healthcare Providers:  IncredibleEmployment.be  This test is no t yet approved or cleared by the [place] FDA and  has been authorized for detection and/or diagnosis of SARS-CoV-2 by FDA under an Emergency Use Authorization (EUA). This EUA will remain  in effect (meaning this test can be used) for the duration of the COVID-19 declaration under Section 564(b)(1) of the Act, 21 U.S.C.section 360bbb-3(b)(1), unless the authorization is terminated  or revoked sooner.       Influenza A by PCR NEGATIVE NEGATIVE Final   Influenza B by PCR NEGATIVE NEGATIVE Final    Comment: (NOTE) The Xpert Xpress SARS-CoV-2/FLU/RSV plus assay is intended as an aid in the diagnosis of influenza from Nasopharyngeal swab specimens and should not be used as a sole basis for treatment. Nasal washings and aspirates are unacceptable for Xpert Xpress SARS-CoV-2/FLU/RSV testing.  Fact Sheet for Patients: EntrepreneurPulse.com.au  Fact Sheet for Healthcare Providers: IncredibleEmployment.be  This test is not yet approved or cleared by the [place] FDA and has been  authorized for detection and/or diagnosis of SARS-CoV-2 by FDA under an Emergency Use Authorization (EUA). This EUA will remain in effect (meaning this test can be used) for the duration of the COVID-19 declaration under Section 564(b)(1) of the Act, 21 U.S.C. section 360bbb-3(b)(1), unless the authorization is terminated or revoked.     Resp Syncytial Virus by PCR NEGATIVE NEGATIVE Final    Comment: (NOTE) Fact Sheet for Patients: EntrepreneurPulse.com.au  Fact Sheet for Healthcare Providers: IncredibleEmployment.be  This test is not yet approved or cleared by the [place] FDA and has been authorized for detection and/or diagnosis of SARS-CoV-2 by FDA under an Emergency Use Authorization (EUA). This EUA will remain in effect (meaning this test can be used) for the duration of the COVID-19 declaration under Section 564(b)(1) of the Act, 21 U.S.C. section 360bbb-3(b)(1), unless the authorization is terminated or revoked.  Performed at [facility] [facility], [address] [place], [place] [address]  Labs: CBC: Recent Labs  Lab 01/28/23 0450 01/29/23 0536  WBC 7.4 8.2  HGB 10.9* 10.7*  HCT 34.3* 33.6*  MCV 87.9 88.0  PLT 170 0000000   Basic Metabolic Panel: Recent Labs  Lab 01/25/23 0430 01/28/23 0450 01/29/23 0536  NA 131* 134* 135  K 3.9 3.3* 3.8  CL 98 98 101  CO2 27 27 24  $ GLUCOSE 114* 106* 152*  BUN 6 15 17  $ CREATININE 0.75 1.07 1.07  CALCIUM 8.6* 8.9 9.0  MG 1.8 1.7 2.1  PHOS 2.5 3.5  --     Discharge time spent: greater than 30 minutes.  Signed: Fritzi Mandes, MD [facility] 01/31/2023

## 2023-02-03 ENCOUNTER — Encounter: Payer: Medicare HMO | Admitting: Family

## 2023-02-03 ENCOUNTER — Telehealth (HOSPITAL_BASED_OUTPATIENT_CLINIC_OR_DEPARTMENT_OTHER): Payer: Self-pay

## 2023-02-03 ENCOUNTER — Encounter: Payer: Medicare HMO | Admitting: Adult Health

## 2023-02-03 NOTE — Telephone Encounter (Addendum)
Left message for patient to call back    ----- Message from Loel Dubonnet, NP sent at 02/01/2023  5:05 PM EST ----- Regarding: has f/u 2/20 Has OV with me 2/20 and OV with Darylene Price, NP (HF clinic in [place]) on 2/22. He likely does not need both appointments and lives in [place]. Can we reach out to see if he would like to cancel my appt and f/u with Otila Kluver in [facility]? I just anticipate he will no-show our visit since we are so far from him.

## 2023-02-03 NOTE — Telephone Encounter (Signed)
Returned call to patient and he does not want to keep any of his appointments and he is tired of seeing doctors. He states he is confused about his medications that he left the hospital with. When RN inquired why he does not want to keep either of his appointments he states it is partly due to insurance but also because he is tired of all these doctors telling him the incorrect things.        ----- Message from Loel Dubonnet, NP sent at 02/01/2023  5:05 PM EST ----- Regarding: has f/u 2/20 Has OV with me 2/20 and OV with Darylene Price, NP (HF clinic in [place]) on 2/22. He likely does not need both appointments and lives in [place]. Can we reach out to see if he would like to cancel my appt and f/u with Otila Kluver in [facility]? I just anticipate he will no-show our visit since we are so far from him.

## 2023-02-03 NOTE — Telephone Encounter (Signed)
Hi! Just FYI we cancelled his OV with [place] tomorrow as it was far from his home and he is seeing you this week. Did try to reiterate need for him to keep his appt with your office. Just a heads up.  Loel Dubonnet, NP

## 2023-02-04 ENCOUNTER — Ambulatory Visit (HOSPITAL_BASED_OUTPATIENT_CLINIC_OR_DEPARTMENT_OTHER): Payer: Medicare HMO | Admitting: Family

## 2023-02-04 ENCOUNTER — Telehealth: Payer: Self-pay | Admitting: *Deleted

## 2023-02-04 NOTE — Telephone Encounter (Signed)
Medina-Vargas, Monina C, NP  You1 hour ago (1:07 PM)    Noted.

## 2023-02-04 NOTE — Telephone Encounter (Signed)
Patient called and stated that he was in the Hospital for over 16 days and Discharged home with a Rx for Oxycodone.  Patient stated that his pharmacy is out of the medication and he is requesting for [place] to transfer the Oxycodone Rx to another pharmacy for him.  Explained to patient that he would need an appointment before we could transfer a Narcotic Rx. Patient had an appointment yesterday for Hospital Follow up and No Showed. I tried to Reschedule appointment with patient to address these concerns and he refused twice.  He stated an appointment would not be no good anyway because he asked Monina for the Oxycodone before at a previous appointment and she refused. Patient stated that he does abuse the medication but was honest and upfront and she still wouldn't refill it, so he stated there was no need in scheduling an appointment.  Patient stated that he will contact the Hospital again for the Rx. To be transferred to a different pharmacy.   FYI

## 2023-02-05 NOTE — Progress Notes (Signed)
This encounter was created in error - please disregard.

## 2023-02-06 ENCOUNTER — Other Ambulatory Visit: Payer: Self-pay | Admitting: Adult Health

## 2023-02-06 ENCOUNTER — Other Ambulatory Visit: Payer: Self-pay | Admitting: Surgery

## 2023-02-06 ENCOUNTER — Encounter: Payer: Medicare HMO | Admitting: Family

## 2023-02-06 ENCOUNTER — Telehealth: Payer: Self-pay | Admitting: Family

## 2023-02-06 DIAGNOSIS — M62838 Other muscle spasm: Secondary | ICD-10-CM

## 2023-02-06 NOTE — Progress Notes (Deleted)
   Patient ID: Ethan Wells, male    DOB: 1971/09/07, 52 y.o.   MRN: [id number]  HPI  Ethan Wells is a 52 y/o male with a history of  Echo 01/17/23 showed an EF of 50-55% along with mild Ethan.   LHC 11/04/22:   Mid Cx lesion is 100% stenosed.   Ramus lesion is 85% stenosed.   Prox LAD to Mid LAD lesion is 5% stenosed.   Prox RCA to Mid RCA lesion is 100% stenosed.   Prox RCA lesion is 80% stenosed.   There is mild left ventricular systolic dysfunction.   LV end diastolic pressure is moderately elevated.   The left ventricular ejection fraction is 45-50% by visual estimate.  1.  Severe two-vessel coronary artery disease with chronically occluded mid left circumflex and mid right coronary artery with collaterals.  These vessels were patent with mild disease on previous cardiac catheterization in 2012.  LAD stent is patent with minimal restenosis.  In addition, there is progression of proximal to mid ramus stenosis. 2.  Mildly reduced LV systolic function with an EF of 45%. 3.  Moderately elevated left ventricular end-diastolic pressure at 26 mmHg.  Admitted 01/16/23 due to   He presents today for his initial HF clinic visit with a chief complaint of   Review of Systems    Physical Exam    Assessment & Plan:  1: Chronic heart failure with preserved ejection fraction- - NYHA class

## 2023-02-06 NOTE — Telephone Encounter (Signed)
Patient has request refill on medication Methocarbamol 738m. Patient medication last refilled 12/23/2022. Patient only given 30 tablets. Medication pend and sent to PCP Medina-Vargas, Monina C, NP for further refills.

## 2023-02-06 NOTE — Telephone Encounter (Signed)
Patient did not show for his initial Heart Failure Clinic appointment on 02/06/23.

## 2023-02-22 ENCOUNTER — Other Ambulatory Visit: Payer: Self-pay

## 2023-02-22 ENCOUNTER — Inpatient Hospital Stay
Admission: EM | Admit: 2023-02-22 | Discharge: 2023-03-05 | DRG: 432 | Disposition: A | Discharge status: Home Health | Payer: Medicare HMO | Attending: Student | Admitting: Student

## 2023-02-22 ENCOUNTER — Encounter: Payer: Self-pay | Admitting: Internal Medicine

## 2023-02-22 DIAGNOSIS — G8929 Other chronic pain: Secondary | ICD-10-CM | POA: Diagnosis present

## 2023-02-22 DIAGNOSIS — F419 Anxiety disorder, unspecified: Secondary | ICD-10-CM | POA: Diagnosis present

## 2023-02-22 DIAGNOSIS — Z955 Presence of coronary angioplasty implant and graft: Secondary | ICD-10-CM

## 2023-02-22 DIAGNOSIS — K7682 Hepatic encephalopathy: Secondary | ICD-10-CM | POA: Diagnosis present

## 2023-02-22 DIAGNOSIS — I959 Hypotension, unspecified: Secondary | ICD-10-CM | POA: Diagnosis not present

## 2023-02-22 DIAGNOSIS — G43909 Migraine, unspecified, not intractable, without status migrainosus: Secondary | ICD-10-CM | POA: Diagnosis present

## 2023-02-22 DIAGNOSIS — Z880 Allergy status to penicillin: Secondary | ICD-10-CM

## 2023-02-22 DIAGNOSIS — I8500 Esophageal varices without bleeding: Secondary | ICD-10-CM | POA: Diagnosis not present

## 2023-02-22 DIAGNOSIS — F32A Depression, unspecified: Secondary | ICD-10-CM | POA: Diagnosis present

## 2023-02-22 DIAGNOSIS — E8809 Other disorders of plasma-protein metabolism, not elsewhere classified: Secondary | ICD-10-CM | POA: Diagnosis present

## 2023-02-22 DIAGNOSIS — I5022 Chronic systolic (congestive) heart failure: Secondary | ICD-10-CM | POA: Diagnosis present

## 2023-02-22 DIAGNOSIS — I5A Non-ischemic myocardial injury (non-traumatic): Secondary | ICD-10-CM | POA: Diagnosis not present

## 2023-02-22 DIAGNOSIS — I493 Ventricular premature depolarization: Secondary | ICD-10-CM | POA: Diagnosis present

## 2023-02-22 DIAGNOSIS — G629 Polyneuropathy, unspecified: Secondary | ICD-10-CM

## 2023-02-22 DIAGNOSIS — F1910 Other psychoactive substance abuse, uncomplicated: Secondary | ICD-10-CM | POA: Diagnosis present

## 2023-02-22 DIAGNOSIS — R1084 Generalized abdominal pain: Secondary | ICD-10-CM | POA: Diagnosis not present

## 2023-02-22 DIAGNOSIS — K2270 Barrett's esophagus without dysplasia: Secondary | ICD-10-CM | POA: Diagnosis not present

## 2023-02-22 DIAGNOSIS — R944 Abnormal results of kidney function studies: Secondary | ICD-10-CM | POA: Diagnosis present

## 2023-02-22 DIAGNOSIS — F17210 Nicotine dependence, cigarettes, uncomplicated: Secondary | ICD-10-CM | POA: Diagnosis present

## 2023-02-22 DIAGNOSIS — K2970 Gastritis, unspecified, without bleeding: Secondary | ICD-10-CM | POA: Diagnosis present

## 2023-02-22 DIAGNOSIS — E785 Hyperlipidemia, unspecified: Secondary | ICD-10-CM | POA: Diagnosis present

## 2023-02-22 DIAGNOSIS — D500 Iron deficiency anemia secondary to blood loss (chronic): Secondary | ICD-10-CM | POA: Diagnosis present

## 2023-02-22 DIAGNOSIS — Z6838 Body mass index (BMI) 38.0-38.9, adult: Secondary | ICD-10-CM | POA: Diagnosis not present

## 2023-02-22 DIAGNOSIS — E669 Obesity, unspecified: Secondary | ICD-10-CM | POA: Diagnosis present

## 2023-02-22 DIAGNOSIS — F1410 Cocaine abuse, uncomplicated: Secondary | ICD-10-CM | POA: Diagnosis present

## 2023-02-22 DIAGNOSIS — I110 Hypertensive heart disease with heart failure: Secondary | ICD-10-CM | POA: Diagnosis present

## 2023-02-22 DIAGNOSIS — I10 Essential (primary) hypertension: Secondary | ICD-10-CM | POA: Diagnosis not present

## 2023-02-22 DIAGNOSIS — K2100 Gastro-esophageal reflux disease with esophagitis, without bleeding: Secondary | ICD-10-CM | POA: Diagnosis present

## 2023-02-22 DIAGNOSIS — K449 Diaphragmatic hernia without obstruction or gangrene: Secondary | ICD-10-CM | POA: Diagnosis not present

## 2023-02-22 DIAGNOSIS — M62838 Other muscle spasm: Secondary | ICD-10-CM

## 2023-02-22 DIAGNOSIS — Z91018 Allergy to other foods: Secondary | ICD-10-CM

## 2023-02-22 DIAGNOSIS — I25119 Atherosclerotic heart disease of native coronary artery with unspecified angina pectoris: Secondary | ICD-10-CM | POA: Diagnosis present

## 2023-02-22 DIAGNOSIS — K766 Portal hypertension: Secondary | ICD-10-CM | POA: Diagnosis not present

## 2023-02-22 DIAGNOSIS — F418 Other specified anxiety disorders: Secondary | ICD-10-CM | POA: Diagnosis not present

## 2023-02-22 DIAGNOSIS — Z811 Family history of alcohol abuse and dependence: Secondary | ICD-10-CM

## 2023-02-22 DIAGNOSIS — Z7952 Long term (current) use of systemic steroids: Secondary | ICD-10-CM

## 2023-02-22 DIAGNOSIS — I252 Old myocardial infarction: Secondary | ICD-10-CM

## 2023-02-22 DIAGNOSIS — E2740 Unspecified adrenocortical insufficiency: Secondary | ICD-10-CM | POA: Diagnosis not present

## 2023-02-22 DIAGNOSIS — K709 Alcoholic liver disease, unspecified: Secondary | ICD-10-CM

## 2023-02-22 DIAGNOSIS — K7030 Alcoholic cirrhosis of liver without ascites: Secondary | ICD-10-CM | POA: Diagnosis not present

## 2023-02-22 DIAGNOSIS — Z8719 Personal history of other diseases of the digestive system: Secondary | ICD-10-CM

## 2023-02-22 DIAGNOSIS — I5033 Acute on chronic diastolic (congestive) heart failure: Secondary | ICD-10-CM | POA: Diagnosis not present

## 2023-02-22 DIAGNOSIS — I255 Ischemic cardiomyopathy: Secondary | ICD-10-CM | POA: Diagnosis present

## 2023-02-22 DIAGNOSIS — R58 Hemorrhage, not elsewhere classified: Secondary | ICD-10-CM | POA: Diagnosis not present

## 2023-02-22 DIAGNOSIS — R000 Tachycardia, unspecified: Secondary | ICD-10-CM | POA: Diagnosis present

## 2023-02-22 DIAGNOSIS — R0902 Hypoxemia: Secondary | ICD-10-CM | POA: Diagnosis not present

## 2023-02-22 DIAGNOSIS — D62 Acute posthemorrhagic anemia: Secondary | ICD-10-CM | POA: Diagnosis not present

## 2023-02-22 DIAGNOSIS — I2510 Atherosclerotic heart disease of native coronary artery without angina pectoris: Secondary | ICD-10-CM | POA: Diagnosis present

## 2023-02-22 DIAGNOSIS — E272 Addisonian crisis: Secondary | ICD-10-CM | POA: Diagnosis present

## 2023-02-22 DIAGNOSIS — Z7189 Other specified counseling: Secondary | ICD-10-CM | POA: Diagnosis not present

## 2023-02-22 DIAGNOSIS — E873 Alkalosis: Secondary | ICD-10-CM | POA: Diagnosis present

## 2023-02-22 DIAGNOSIS — E876 Hypokalemia: Secondary | ICD-10-CM | POA: Diagnosis present

## 2023-02-22 DIAGNOSIS — K922 Gastrointestinal hemorrhage, unspecified: Secondary | ICD-10-CM | POA: Diagnosis not present

## 2023-02-22 DIAGNOSIS — R079 Chest pain, unspecified: Secondary | ICD-10-CM | POA: Diagnosis not present

## 2023-02-22 DIAGNOSIS — F1210 Cannabis abuse, uncomplicated: Secondary | ICD-10-CM | POA: Diagnosis present

## 2023-02-22 DIAGNOSIS — R188 Other ascites: Secondary | ICD-10-CM | POA: Diagnosis not present

## 2023-02-22 DIAGNOSIS — Z23 Encounter for immunization: Secondary | ICD-10-CM

## 2023-02-22 DIAGNOSIS — E782 Mixed hyperlipidemia: Secondary | ICD-10-CM

## 2023-02-22 DIAGNOSIS — I25118 Atherosclerotic heart disease of native coronary artery with other forms of angina pectoris: Secondary | ICD-10-CM | POA: Diagnosis not present

## 2023-02-22 DIAGNOSIS — R600 Localized edema: Secondary | ICD-10-CM

## 2023-02-22 DIAGNOSIS — I8511 Secondary esophageal varices with bleeding: Secondary | ICD-10-CM | POA: Diagnosis not present

## 2023-02-22 DIAGNOSIS — I5032 Chronic diastolic (congestive) heart failure: Secondary | ICD-10-CM | POA: Diagnosis present

## 2023-02-22 DIAGNOSIS — Z136 Encounter for screening for cardiovascular disorders: Secondary | ICD-10-CM | POA: Diagnosis not present

## 2023-02-22 DIAGNOSIS — R140 Abdominal distension (gaseous): Secondary | ICD-10-CM | POA: Diagnosis not present

## 2023-02-22 DIAGNOSIS — T18128A Food in esophagus causing other injury, initial encounter: Secondary | ICD-10-CM | POA: Diagnosis not present

## 2023-02-22 DIAGNOSIS — Z8249 Family history of ischemic heart disease and other diseases of the circulatory system: Secondary | ICD-10-CM

## 2023-02-22 DIAGNOSIS — D696 Thrombocytopenia, unspecified: Secondary | ICD-10-CM | POA: Diagnosis not present

## 2023-02-22 DIAGNOSIS — Z91199 Patient's noncompliance with other medical treatment and regimen due to unspecified reason: Secondary | ICD-10-CM

## 2023-02-22 DIAGNOSIS — Z79899 Other long term (current) drug therapy: Secondary | ICD-10-CM

## 2023-02-22 DIAGNOSIS — F1020 Alcohol dependence, uncomplicated: Secondary | ICD-10-CM | POA: Diagnosis present

## 2023-02-22 DIAGNOSIS — Z8674 Personal history of sudden cardiac arrest: Secondary | ICD-10-CM

## 2023-02-22 DIAGNOSIS — K920 Hematemesis: Secondary | ICD-10-CM | POA: Diagnosis present

## 2023-02-22 DIAGNOSIS — R55 Syncope and collapse: Secondary | ICD-10-CM | POA: Diagnosis not present

## 2023-02-22 DIAGNOSIS — K3189 Other diseases of stomach and duodenum: Secondary | ICD-10-CM | POA: Diagnosis not present

## 2023-02-22 LAB — CBC WITH DIFFERENTIAL/PLATELET
Abs Immature Granulocytes: 0.08 10*3/uL — ABNORMAL HIGH (ref 0.00–0.07)
Basophils Absolute: 0 10*3/uL (ref 0.0–0.1)
Basophils Relative: 0 %
Eosinophils Absolute: 0 10*3/uL (ref 0.0–0.5)
Eosinophils Relative: 0 %
HCT: 23.7 % — ABNORMAL LOW (ref 39.0–52.0)
Hemoglobin: 7.5 g/dL — ABNORMAL LOW (ref 13.0–17.0)
Immature Granulocytes: 1 %
Lymphocytes Relative: 19 %
Lymphs Abs: 2.2 10*3/uL (ref 0.7–4.0)
MCH: 28.7 pg (ref 26.0–34.0)
MCHC: 31.6 g/dL (ref 30.0–36.0)
MCV: 90.8 fL (ref 80.0–100.0)
Monocytes Absolute: 0.9 10*3/uL (ref 0.1–1.0)
Monocytes Relative: 7 %
Neutro Abs: 8.4 10*3/uL — ABNORMAL HIGH (ref 1.7–7.7)
Neutrophils Relative %: 73 %
Platelets: 108 10*3/uL — ABNORMAL LOW (ref 150–400)
RBC: 2.61 MIL/uL — ABNORMAL LOW (ref 4.22–5.81)
RDW: 20.1 % — ABNORMAL HIGH (ref 11.5–15.5)
WBC: 11.6 10*3/uL — ABNORMAL HIGH (ref 4.0–10.5)
nRBC: 0.2 % (ref 0.0–0.2)

## 2023-02-22 LAB — LIPID PANEL
Cholesterol: 100 mg/dL (ref 0–200)
HDL: 27 mg/dL — ABNORMAL LOW (ref >40)
LDL Cholesterol: 62 mg/dL (ref 0–99)
Total CHOL/HDL Ratio: 3.7 RATIO
Triglycerides: 53 mg/dL (ref <150)
VLDL: 11 mg/dL (ref 0–40)

## 2023-02-22 LAB — COMPREHENSIVE METABOLIC PANEL
ALT: 15 U/L (ref 0–44)
ALT: 13 U/L (ref 0–44)
AST: 48 U/L — ABNORMAL HIGH (ref 15–41)
AST: 37 U/L (ref 15–41)
Albumin: 2.4 g/dL — ABNORMAL LOW (ref 3.5–5.0)
Albumin: 2 g/dL — ABNORMAL LOW (ref 3.5–5.0)
Alkaline Phosphatase: 122 U/L (ref 38–126)
Alkaline Phosphatase: 91 U/L (ref 38–126)
Anion gap: 13 (ref 5–15)
Anion gap: 4 — ABNORMAL LOW (ref 5–15)
BUN: 30 mg/dL — ABNORMAL HIGH (ref 6–20)
BUN: 22 mg/dL — ABNORMAL HIGH (ref 6–20)
CO2: 27 mmol/L (ref 22–32)
CO2: 28 mmol/L (ref 22–32)
Calcium: 8.7 mg/dL — ABNORMAL LOW (ref 8.9–10.3)
Calcium: 7.5 mg/dL — ABNORMAL LOW (ref 8.9–10.3)
Chloride: 99 mmol/L (ref 98–111)
Chloride: 105 mmol/L (ref 98–111)
Creatinine, Ser: 1 mg/dL (ref 0.61–1.24)
Creatinine, Ser: 0.67 mg/dL (ref 0.61–1.24)
GFR, Estimated: >60 mL/min (ref >60)
GFR, Estimated: >60 mL/min (ref >60)
Glucose, Bld: 160 mg/dL — ABNORMAL HIGH (ref 70–99)
Glucose, Bld: 132 mg/dL — ABNORMAL HIGH (ref 70–99)
Potassium: 3.7 mmol/L (ref 3.5–5.1)
Potassium: 3.1 mmol/L — ABNORMAL LOW (ref 3.5–5.1)
Sodium: 139 mmol/L (ref 135–145)
Sodium: 137 mmol/L (ref 135–145)
Total Bilirubin: 3.4 mg/dL — ABNORMAL HIGH (ref 0.3–1.2)
Total Bilirubin: 2.4 mg/dL — ABNORMAL HIGH (ref 0.3–1.2)
Total Protein: 5.8 g/dL — ABNORMAL LOW (ref 6.5–8.1)
Total Protein: 4.7 g/dL — ABNORMAL LOW (ref 6.5–8.1)

## 2023-02-22 LAB — URINE DRUG SCREEN, QUALITATIVE (ARMC ONLY)
Amphetamines, Ur Screen: NOT DETECTED
Barbiturates, Ur Screen: NOT DETECTED
Benzodiazepine, Ur Scrn: NOT DETECTED
Cannabinoid 50 Ng, Ur ~~LOC~~: NOT DETECTED
Cocaine Metabolite,Ur ~~LOC~~: POSITIVE — AB
MDMA (Ecstasy)Ur Screen: NOT DETECTED
Methadone Scn, Ur: NOT DETECTED
Opiate, Ur Screen: POSITIVE — AB
Phencyclidine (PCP) Ur S: NOT DETECTED
Tricyclic, Ur Screen: NOT DETECTED

## 2023-02-22 LAB — PROTIME-INR
INR: 1.6 — ABNORMAL HIGH (ref 0.8–1.2)
Prothrombin Time: 18.7 seconds — ABNORMAL HIGH (ref 11.4–15.2)

## 2023-02-22 LAB — RETICULOCYTES
Immature Retic Fract: 32.5 % — ABNORMAL HIGH (ref 2.3–15.9)
RBC.: 2.66 MIL/uL — ABNORMAL LOW (ref 4.22–5.81)
Retic Count, Absolute: 63.3 10*3/uL (ref 19.0–186.0)
Retic Ct Pct: 2.4 % (ref 0.4–3.1)

## 2023-02-22 LAB — VITAMIN B12: Vitamin B-12: 476 pg/mL (ref 180–914)

## 2023-02-22 LAB — GLUCOSE, CAPILLARY
Glucose-Capillary: 109 mg/dL — ABNORMAL HIGH (ref 70–99)
Glucose-Capillary: 134 mg/dL — ABNORMAL HIGH (ref 70–99)

## 2023-02-22 LAB — BRAIN NATRIURETIC PEPTIDE: B Natriuretic Peptide: 207.2 pg/mL — ABNORMAL HIGH (ref 0.0–100.0)

## 2023-02-22 LAB — CBC
HCT: 23.8 % — ABNORMAL LOW (ref 39.0–52.0)
HCT: 24.2 % — ABNORMAL LOW (ref 39.0–52.0)
HCT: 21.6 % — ABNORMAL LOW (ref 39.0–52.0)
Hemoglobin: 8 g/dL — ABNORMAL LOW (ref 13.0–17.0)
Hemoglobin: 8 g/dL — ABNORMAL LOW (ref 13.0–17.0)
Hemoglobin: 7.2 g/dL — ABNORMAL LOW (ref 13.0–17.0)
MCH: 28.9 pg (ref 26.0–34.0)
MCH: 28.7 pg (ref 26.0–34.0)
MCH: 29 pg (ref 26.0–34.0)
MCHC: 33.6 g/dL (ref 30.0–36.0)
MCHC: 33.1 g/dL (ref 30.0–36.0)
MCHC: 33.3 g/dL (ref 30.0–36.0)
MCV: 85.9 fL (ref 80.0–100.0)
MCV: 86.7 fL (ref 80.0–100.0)
MCV: 87.1 fL (ref 80.0–100.0)
Platelets: 103 10*3/uL — ABNORMAL LOW (ref 150–400)
Platelets: 130 10*3/uL — ABNORMAL LOW (ref 150–400)
Platelets: 90 10*3/uL — ABNORMAL LOW (ref 150–400)
RBC: 2.77 MIL/uL — ABNORMAL LOW (ref 4.22–5.81)
RBC: 2.79 MIL/uL — ABNORMAL LOW (ref 4.22–5.81)
RBC: 2.48 MIL/uL — ABNORMAL LOW (ref 4.22–5.81)
RDW: 20.3 % — ABNORMAL HIGH (ref 11.5–15.5)
RDW: 20.7 % — ABNORMAL HIGH (ref 11.5–15.5)
RDW: 20.4 % — ABNORMAL HIGH (ref 11.5–15.5)
WBC: 10.5 10*3/uL (ref 4.0–10.5)
WBC: 11.6 10*3/uL — ABNORMAL HIGH (ref 4.0–10.5)
WBC: 9.2 10*3/uL (ref 4.0–10.5)
nRBC: 0 % (ref 0.0–0.2)
nRBC: 0 % (ref 0.0–0.2)
nRBC: 0 % (ref 0.0–0.2)

## 2023-02-22 LAB — FOLATE: Folate: 7.9 ng/mL (ref >5.9)

## 2023-02-22 LAB — URINALYSIS, ROUTINE W REFLEX MICROSCOPIC
Bilirubin Urine: NEGATIVE
Glucose, UA: NEGATIVE mg/dL
Hgb urine dipstick: NEGATIVE
Ketones, ur: 5 mg/dL — AB
Leukocytes,Ua: NEGATIVE
Nitrite: NEGATIVE
Protein, ur: NEGATIVE mg/dL
Specific Gravity, Urine: 1.018 (ref 1.005–1.030)
pH: 5 (ref 5.0–8.0)

## 2023-02-22 LAB — PREPARE RBC (CROSSMATCH)

## 2023-02-22 LAB — BLOOD GAS, VENOUS
Acid-Base Excess: 5.9 mmol/L — ABNORMAL HIGH (ref 0.0–2.0)
Bicarbonate: 29.7 mmol/L — ABNORMAL HIGH (ref 20.0–28.0)
O2 Saturation: 69.3 %
Patient temperature: 37
pCO2, Ven: 39 mmHg — ABNORMAL LOW (ref 44–60)
pH, Ven: 7.49 — ABNORMAL HIGH (ref 7.25–7.43)
pO2, Ven: 43 mmHg (ref 32–45)

## 2023-02-22 LAB — IRON AND TIBC
Iron: 289 ug/dL — ABNORMAL HIGH (ref 45–182)
Saturation Ratios: 91 % — ABNORMAL HIGH (ref 17.9–39.5)
TIBC: 319 ug/dL (ref 250–450)
UIBC: 30 ug/dL

## 2023-02-22 LAB — TROPONIN I (HIGH SENSITIVITY)
Troponin I (High Sensitivity): 46 ng/L — ABNORMAL HIGH (ref <18)
Troponin I (High Sensitivity): 58 ng/L — ABNORMAL HIGH (ref <18)
Troponin I (High Sensitivity): 80 ng/L — ABNORMAL HIGH (ref <18)
Troponin I (High Sensitivity): 106 ng/L (ref <18)
Troponin I (High Sensitivity): 113 ng/L (ref <18)
Troponin I (High Sensitivity): 154 ng/L (ref <18)
Troponin I (High Sensitivity): 172 ng/L (ref <18)

## 2023-02-22 LAB — APTT: aPTT: 33 seconds (ref 24–36)

## 2023-02-22 LAB — HEMOGLOBIN AND HEMATOCRIT, BLOOD
HCT: 20.9 % — ABNORMAL LOW (ref 39.0–52.0)
Hemoglobin: 7.1 g/dL — ABNORMAL LOW (ref 13.0–17.0)

## 2023-02-22 LAB — AMMONIA
Ammonia: 56 umol/L — ABNORMAL HIGH (ref 9–35)
Ammonia: 44 umol/L — ABNORMAL HIGH (ref 9–35)

## 2023-02-22 LAB — FERRITIN: Ferritin: 203 ng/mL (ref 24–336)

## 2023-02-22 LAB — MAGNESIUM
Magnesium: 1.6 mg/dL — ABNORMAL LOW (ref 1.7–2.4)
Magnesium: 2 mg/dL (ref 1.7–2.4)

## 2023-02-22 LAB — LIPASE, BLOOD: Lipase: 26 U/L (ref 11–51)

## 2023-02-22 LAB — PHOSPHORUS: Phosphorus: 2.9 mg/dL (ref 2.5–4.6)

## 2023-02-22 LAB — MRSA NEXT GEN BY PCR, NASAL: MRSA by PCR Next Gen: NOT DETECTED

## 2023-02-22 MED ORDER — POTASSIUM CHLORIDE CRYS ER 20 MEQ PO TBCR: 40.0000 meq | EXTENDED_RELEASE_TABLET | Freq: Once | ORAL | Status: DC

## 2023-02-22 MED ORDER — NICOTINE 21 MG/24HR TD PT24
21.0000 mg | MEDICATED_PATCH | Freq: Every day | TRANSDERMAL | Status: DC
  Administered 2023-02-22 – 2023-03-05 (×12): 21 mg via TRANSDERMAL
  Filled 2023-02-22 (×12): qty 1

## 2023-02-22 MED ORDER — SODIUM CHLORIDE 0.9 % IV SOLN
2.0000 g | INTRAVENOUS | Status: DC
  Administered 2023-02-23 – 2023-02-27 (×5): 2 g via INTRAVENOUS
  Filled 2023-02-22 (×2): qty 2
  Filled 2023-02-22: qty 20
  Filled 2023-02-22: qty 2
  Filled 2023-02-22: qty 20

## 2023-02-22 MED ORDER — LACTATED RINGERS IV BOLUS: 1000.0000 mL | Freq: Once | INTRAVENOUS | Status: DC

## 2023-02-22 MED ORDER — LORAZEPAM 2 MG PO TABS
0.0000 mg | ORAL_TABLET | Freq: Two times a day (BID) | ORAL | Status: AC
  Administered 2023-02-24: 2 mg via ORAL
  Filled 2023-02-22 (×2): qty 1

## 2023-02-22 MED ORDER — MORPHINE SULFATE (PF) 4 MG/ML IV SOLN
4.0000 mg | Freq: Once | INTRAVENOUS | Status: AC
  Administered 2023-02-22: 4 mg via INTRAVENOUS
  Filled 2023-02-22: qty 1

## 2023-02-22 MED ORDER — ADULT MULTIVITAMIN W/MINERALS CH
1.0000 | ORAL_TABLET | Freq: Every day | ORAL | Status: DC
  Administered 2023-02-22 – 2023-03-05 (×12): 1 via ORAL
  Filled 2023-02-22 (×12): qty 1

## 2023-02-22 MED ORDER — ALBUMIN HUMAN 25 % IV SOLN
25.0000 g | Freq: Once | INTRAVENOUS | Status: AC
  Administered 2023-02-22: 25 g via INTRAVENOUS
  Filled 2023-02-22: qty 100

## 2023-02-22 MED ORDER — METHOCARBAMOL 500 MG PO TABS
750.0000 mg | ORAL_TABLET | Freq: Every day | ORAL | Status: DC | PRN
  Administered 2023-02-22 – 2023-03-04 (×9): 750 mg via ORAL
  Filled 2023-02-22: qty 1
  Filled 2023-02-22 (×2): qty 2
  Filled 2023-02-22: qty 1
  Filled 2023-02-22 (×4): qty 2
  Filled 2023-02-22: qty 1
  Filled 2023-02-22: qty 2

## 2023-02-22 MED ORDER — HYDROCORTISONE SOD SUC (PF) 100 MG IJ SOLR
100.0000 mg | Freq: Two times a day (BID) | INTRAMUSCULAR | Status: AC
  Administered 2023-02-22 (×2): 100 mg via INTRAVENOUS
  Filled 2023-02-22 (×2): qty 2

## 2023-02-22 MED ORDER — MIDODRINE HCL 5 MG PO TABS
10.0000 mg | ORAL_TABLET | Freq: Once | ORAL | Status: AC
  Administered 2023-02-22: 10 mg via ORAL
  Filled 2023-02-22: qty 2

## 2023-02-22 MED ORDER — EZETIMIBE 10 MG PO TABS
10.0000 mg | ORAL_TABLET | Freq: Every day | ORAL | Status: DC
  Administered 2023-02-22 – 2023-03-05 (×12): 10 mg via ORAL
  Filled 2023-02-22 (×13): qty 1

## 2023-02-22 MED ORDER — SODIUM CHLORIDE 0.9 % IV SOLN: INTRAVENOUS | Status: DC

## 2023-02-22 MED ORDER — LACTULOSE 10 GM/15ML PO SOLN
20.0000 g | Freq: Two times a day (BID) | ORAL | Status: DC
  Administered 2023-02-22 – 2023-03-05 (×22): 20 g via ORAL
  Filled 2023-02-22 (×22): qty 30

## 2023-02-22 MED ORDER — SODIUM CHLORIDE 0.9 % IV SOLN
2.0000 g | Freq: Once | INTRAVENOUS | Status: AC
  Administered 2023-02-22: 2 g via INTRAVENOUS
  Filled 2023-02-22: qty 20

## 2023-02-22 MED ORDER — SODIUM CHLORIDE 0.9 % IV SOLN: 10.0000 mL/h | Freq: Once | INTRAVENOUS | Status: DC

## 2023-02-22 MED ORDER — LACTATED RINGERS IV BOLUS
500.0000 mL | Freq: Once | INTRAVENOUS | Status: AC
  Administered 2023-02-22: 500 mL via INTRAVENOUS

## 2023-02-22 MED ORDER — MAGNESIUM SULFATE 2 GM/50ML IV SOLN
2.0000 g | Freq: Once | INTRAVENOUS | Status: AC
  Administered 2023-02-22: 2 g via INTRAVENOUS
  Filled 2023-02-22: qty 50

## 2023-02-22 MED ORDER — FERROUS SULFATE 325 (65 FE) MG PO TABS
325.0000 mg | ORAL_TABLET | Freq: Every day | ORAL | Status: DC
  Administered 2023-02-22 – 2023-03-05 (×10): 325 mg via ORAL
  Filled 2023-02-22 (×11): qty 1

## 2023-02-22 MED ORDER — METOCLOPRAMIDE HCL 5 MG/ML IJ SOLN
10.0000 mg | Freq: Four times a day (QID) | INTRAMUSCULAR | Status: DC | PRN
  Administered 2023-02-22 – 2023-02-23 (×2): 10 mg via INTRAVENOUS
  Filled 2023-02-22 (×2): qty 2

## 2023-02-22 MED ORDER — GABAPENTIN 300 MG PO CAPS
300.0000 mg | ORAL_CAPSULE | Freq: Three times a day (TID) | ORAL | Status: DC
  Administered 2023-02-22 – 2023-03-05 (×34): 300 mg via ORAL
  Filled 2023-02-22 (×34): qty 1

## 2023-02-22 MED ORDER — FOLIC ACID 1 MG PO TABS
1.0000 mg | ORAL_TABLET | Freq: Every day | ORAL | Status: DC
  Administered 2023-02-22 – 2023-03-05 (×12): 1 mg via ORAL
  Filled 2023-02-22 (×12): qty 1

## 2023-02-22 MED ORDER — PANTOPRAZOLE INFUSION (NEW) - SIMPLE MED
8.0000 mg/h | INTRAVENOUS | Status: AC
  Administered 2023-02-22 – 2023-02-24 (×7): 8 mg/h via INTRAVENOUS
  Filled 2023-02-22 (×7): qty 100

## 2023-02-22 MED ORDER — SODIUM CHLORIDE 0.9 % IV SOLN
10.0000 mL/h | Freq: Once | INTRAVENOUS | Status: AC
  Administered 2023-02-22: 10 mL/h via INTRAVENOUS

## 2023-02-22 MED ORDER — ACETAMINOPHEN 325 MG PO TABS
650.0000 mg | ORAL_TABLET | Freq: Four times a day (QID) | ORAL | Status: DC | PRN
  Administered 2023-02-23 – 2023-03-04 (×8): 650 mg via ORAL
  Filled 2023-02-22 (×8): qty 2

## 2023-02-22 MED ORDER — ROSUVASTATIN CALCIUM 10 MG PO TABS
40.0000 mg | ORAL_TABLET | Freq: Every day | ORAL | Status: DC
  Administered 2023-02-22 – 2023-03-05 (×12): 40 mg via ORAL
  Filled 2023-02-22 (×9): qty 4
  Filled 2023-02-22: qty 2
  Filled 2023-02-22 (×3): qty 4

## 2023-02-22 MED ORDER — OCTREOTIDE LOAD VIA INFUSION
50.0000 ug | Freq: Once | INTRAVENOUS | Status: AC
  Administered 2023-02-22: 50 ug via INTRAVENOUS
  Filled 2023-02-22: qty 25

## 2023-02-22 MED ORDER — SODIUM CHLORIDE 0.9% IV SOLUTION: Freq: Once | INTRAVENOUS | Status: AC

## 2023-02-22 MED ORDER — THIAMINE MONONITRATE 100 MG PO TABS
100.0000 mg | ORAL_TABLET | Freq: Every day | ORAL | Status: DC
  Administered 2023-02-22 – 2023-03-05 (×12): 100 mg via ORAL
  Filled 2023-02-22 (×12): qty 1

## 2023-02-22 MED ORDER — SODIUM CHLORIDE 0.9 % IV SOLN
50.0000 ug/h | INTRAVENOUS | Status: DC
  Administered 2023-02-22 – 2023-02-27 (×11): 50 ug/h via INTRAVENOUS
  Filled 2023-02-22 (×15): qty 1

## 2023-02-22 MED ORDER — POTASSIUM CHLORIDE 10 MEQ/100ML IV SOLN
10.0000 meq | INTRAVENOUS | Status: DC
  Filled 2023-02-22 (×3): qty 100

## 2023-02-22 MED ORDER — LACTATED RINGERS IV BOLUS
1000.0000 mL | Freq: Once | INTRAVENOUS | Status: AC
  Administered 2023-02-22: 1000 mL via INTRAVENOUS

## 2023-02-22 MED ORDER — POTASSIUM CHLORIDE CRYS ER 20 MEQ PO TBCR
40.0000 meq | EXTENDED_RELEASE_TABLET | ORAL | Status: AC
  Administered 2023-02-22 – 2023-02-23 (×2): 40 meq via ORAL
  Filled 2023-02-22 (×2): qty 2

## 2023-02-22 MED ORDER — METOCLOPRAMIDE HCL 5 MG/ML IJ SOLN
10.0000 mg | Freq: Once | INTRAMUSCULAR | Status: AC
  Administered 2023-02-22: 10 mg via INTRAVENOUS
  Filled 2023-02-22: qty 2

## 2023-02-22 MED ORDER — PANTOPRAZOLE SODIUM 40 MG IV SOLR
40.0000 mg | Freq: Two times a day (BID) | INTRAVENOUS | Status: DC
  Administered 2023-02-25 – 2023-02-26 (×3): 40 mg via INTRAVENOUS
  Filled 2023-02-22 (×4): qty 10

## 2023-02-22 MED ORDER — THIAMINE HCL 100 MG/ML IJ SOLN
100.0000 mg | Freq: Every day | INTRAMUSCULAR | Status: DC
  Filled 2023-02-22 (×4): qty 2

## 2023-02-22 MED ORDER — QUETIAPINE FUMARATE 25 MG PO TABS
25.0000 mg | ORAL_TABLET | Freq: Every day | ORAL | Status: DC
  Administered 2023-02-23 – 2023-03-04 (×10): 25 mg via ORAL
  Filled 2023-02-22 (×10): qty 1

## 2023-02-22 MED ORDER — OXYCODONE HCL 5 MG PO TABS
10.0000 mg | ORAL_TABLET | Freq: Three times a day (TID) | ORAL | Status: DC | PRN
  Administered 2023-02-23 – 2023-02-25 (×6): 10 mg via ORAL
  Filled 2023-02-22 (×7): qty 2

## 2023-02-22 MED ORDER — LORAZEPAM 2 MG PO TABS
0.0000 mg | ORAL_TABLET | Freq: Four times a day (QID) | ORAL | Status: AC
  Administered 2023-02-22 – 2023-02-23 (×2): 1 mg via ORAL
  Administered 2023-02-23 (×2): 2 mg via ORAL
  Filled 2023-02-22 (×4): qty 1

## 2023-02-22 MED ORDER — RIFAXIMIN 550 MG PO TABS
550.0000 mg | ORAL_TABLET | Freq: Two times a day (BID) | ORAL | Status: DC
  Administered 2023-02-22 – 2023-03-05 (×23): 550 mg via ORAL
  Filled 2023-02-22 (×24): qty 1

## 2023-02-22 MED ORDER — HYDROCORTISONE 10 MG PO TABS
20.0000 mg | ORAL_TABLET | Freq: Every day | ORAL | Status: DC
  Administered 2023-02-23 – 2023-03-05 (×11): 20 mg via ORAL
  Filled 2023-02-22 (×11): qty 2

## 2023-02-22 MED ORDER — MIDODRINE HCL 5 MG PO TABS
10.0000 mg | ORAL_TABLET | Freq: Three times a day (TID) | ORAL | Status: DC
  Administered 2023-02-22 – 2023-03-05 (×33): 10 mg via ORAL
  Filled 2023-02-22 (×33): qty 2

## 2023-02-22 MED ORDER — CHLORHEXIDINE GLUCONATE CLOTH 2 % EX PADS
6.0000 | MEDICATED_PAD | Freq: Every day | CUTANEOUS | Status: DC
  Administered 2023-02-22 – 2023-02-23 (×2): 6 via TOPICAL

## 2023-02-22 MED ORDER — PANTOPRAZOLE 80MG IVPB - SIMPLE MED
80.0000 mg | Freq: Once | INTRAVENOUS | Status: AC
  Administered 2023-02-22: 80 mg via INTRAVENOUS
  Filled 2023-02-22: qty 100

## 2023-02-22 MED ORDER — TRAZODONE HCL 50 MG PO TABS
25.0000 mg | ORAL_TABLET | Freq: Every evening | ORAL | Status: DC | PRN
  Administered 2023-02-24 – 2023-03-04 (×8): 25 mg via ORAL
  Filled 2023-02-22 (×8): qty 1

## 2023-02-22 MED ORDER — ORAL CARE MOUTH RINSE: 15.0000 mL | OROMUCOSAL | Status: DC | PRN

## 2023-02-22 MED ORDER — ACETAMINOPHEN 650 MG RE SUPP: 650.0000 mg | Freq: Four times a day (QID) | RECTAL | Status: DC | PRN

## 2023-02-22 MED ORDER — INFLUENZA VAC SPLIT QUAD 0.5 ML IM SUSY
0.5000 mL | PREFILLED_SYRINGE | INTRAMUSCULAR | Status: AC
  Administered 2023-02-23: 0.5 mL via INTRAMUSCULAR
  Filled 2023-02-22: qty 0.5

## 2023-02-22 NOTE — ED Notes (Signed)
ED Provider at bedside. 

## 2023-02-22 NOTE — H&P (Addendum)
History and Physical    Ethan Wells [id number] DOB: 06-Nov-1971 DOA: 02/22/2023  Referring MD/NP/PA:   PCP: Ethan Major, NP   Patient coming from:  The patient is coming from home.    Chief Complaint: GI bleeding  HPI: Ethan Wells is a 52 y.o. male with medical history significant of polysubstance abuse (alcohol, tobacco, cocaine, marijuana), GI bleeding, hypertension, hyperlipidemia, CAD, STEMI with BMS placement, CHF, depression with anxiety, adrenal insufficiency, cardiac arrest due to V-fib in the setting of STEMI, who presents with GI bleeding.   Patient was recently hospitalized from 2/1 - 2/16 due to GI bleeding related hemorrhagic shock. Pt states that he started having coffee-ground hematemesis again since yesterday.  He states that it totally he has had more than 20 times of hematemesis.  He also has black tarry stool.  Patient has nausea and abdominal pain.  His abdominal pain is diffuse, constant, aching, mild to moderate, nonradiating.  No fever or chills.  He has mild shortness breath and chest discomfort, no cough.  Denies symptoms of UTI.  No confusion.  He reports possible syncope but he is uncertain about this.  No unilateral numbness or tinglings or weakness extremities.  Patient was found to be hypotensive initially, with blood pressure 74/56, which improved to 92/53 after giving 3 L LR in ED.  Data reviewed independently and ED Course: pt was found to have hemoglobin 10.7 on 01/29/2023 --> 7.5, troponin 46, WBC 11.6, INR 1.6, PTT 33, temperature normal, heart rate 100-120s, RR 30 --> 14, oxygen saturation 98% on room air.  Patient is admitted to stepdown as inpatient.  Dr. Marius Ditch of GI is consulted.   EKG: I have personally reviewed.  Sinus rhythm, QTc 471, LAE, early R wave progression, frequent PVC.  Review of Systems:   General: no fevers, chills, no body weight gain, has poor appetite, has fatigue HEENT: no blurry vision, hearing changes  or sore throat Respiratory: has dyspnea, no coughing, no wheezing CV: has chest discomfort, no palpitations GI: has nausea, abdominal pain, hematemesis, melena GU: no dysuria, burning on urination, increased urinary frequency, hematuria  Ext: no leg edema Neuro: no unilateral weakness, numbness, or tingling, no vision change or hearing loss Skin: no rash, no skin tear. MSK: No muscle spasm, no deformity, no limitation of range of movement in spin Heme: No easy bruising.  Travel history: No recent long distant travel.   Allergy:  Allergies  Allergen Reactions   Penicillins Hives    Tolerated Ceftriaxone 10/2022   Other Rash    chives    Past Medical History:  Diagnosis Date   CAD (coronary artery disease)    a. 01/2011 Anterior STEMI/Cath/PCI: LM nl, LAD 100d (3.5x55m Vision BMS placed), LCX 243mRI 50, RCA min irregs, EF 40% w/ apical, inferoapical HK; b. 10/2022 NSTSEMI/Cath: LM nl, LAD 5p/m, RI 85, LCX 10077mM1 mild dzs, OM3 100 fills via L->L collats from dLAD, RCA 80p, 100m39mDA fills via collats from LAD. EF 45-50%-->Med Rx.   Cardiac arrest - ventricular fibrillation    a. In setting of STEMI 01/2011   Chronic pain    Cocaine abuse (HCC)[place] Depression with anxiety    ETOH abuse    a. 6-12 beers/day   GERD (gastroesophageal reflux disease)    GI bleed    Hemorrhoids    Hyperlipidemia    Hypertension    Ischemic cardiomyopathy    a. 06/2011 Echo: EF 45-50%, No rwma; b. 10/2022  Echo: EF 55-60%, no rwma, nl RV fxn.   Marijuana abuse    Migraines    Tobacco abuse    a. 1/2 ppd x 26 yrs    Past Surgical History:  Procedure Laterality Date   COLONOSCOPY  08/09/2021   Procedure: COLONOSCOPY;  Surgeon: Lucilla Lame, MD;  Location: [facility] ENDOSCOPY;  Service: Endoscopy;;   CORONARY ANGIOPLASTY WITH STENT PLACEMENT     ESOPHAGOGASTRODUODENOSCOPY N/A 05/30/2022   Procedure: ESOPHAGOGASTRODUODENOSCOPY (EGD);  Surgeon: Lin Landsman, MD;  Location: [facility] ENDOSCOPY;   Service: Gastroenterology;  Laterality: N/A;   ESOPHAGOGASTRODUODENOSCOPY (EGD) WITH PROPOFOL N/A 03/01/2021   Procedure: ESOPHAGOGASTRODUODENOSCOPY (EGD) WITH PROPOFOL;  Surgeon: Lin Landsman, MD;  Location: [place];  Service: Endoscopy;  Laterality: N/A;   ESOPHAGOGASTRODUODENOSCOPY (EGD) WITH PROPOFOL N/A 08/09/2021   Procedure: ESOPHAGOGASTRODUODENOSCOPY (EGD) WITH PROPOFOL;  Surgeon: Lucilla Lame, MD;  Location: [facility] ENDOSCOPY;  Service: Endoscopy;  Laterality: N/A;   EVALUATION UNDER ANESTHESIA WITH HEMORRHOIDECTOMY N/A 10/31/2022   Procedure: EXAM UNDER ANESTHESIA WITH SUTURE LIGATION OF TWO BLEEDING PEDICLES;  Surgeon: Olean Ree, MD;  Location: [facility] ORS;  Service: General;  Laterality: N/A;   FLEXIBLE SIGMOIDOSCOPY N/A 10/02/2022   Procedure: FLEXIBLE SIGMOIDOSCOPY;  Surgeon: Ladene Artist, MD;  Location: [place];  Service: Gastroenterology;  Laterality: N/A;   LEFT HEART CATH AND CORONARY ANGIOGRAPHY N/A 11/04/2022   Procedure: LEFT HEART CATH AND CORONARY ANGIOGRAPHY;  Surgeon: Wellington Hampshire, MD;  Location: [place] CV LAB;  Service: Cardiovascular;  Laterality: N/A;   VISCERAL ANGIOGRAPHY N/A 10/24/2022   Procedure: VISCERAL ANGIOGRAPHY;  Surgeon: Algernon Huxley, MD;  Location: [place] CV LAB;  Service: Cardiovascular;  Laterality: N/A;    Social History:  reports that he has been smoking cigarettes. He has a 20.50 pack-year smoking history. He has never used smokeless tobacco. He reports current alcohol use of about 12.0 standard drinks of alcohol per week. He reports that he does not currently use drugs after having used the following drugs: Cocaine and Marijuana.  Family History:  Family History  Problem Relation Age of Onset   Coronary artery disease Mother        alive   Other Other        2 sisters alive and well   Alcohol abuse Father        died @ 63   Other Paternal Grandmother        PACER     Prior to Admission medications    Medication Sig Start Date End Date Taking? Authorizing Provider  ezetimibe (ZETIA) 10 MG tablet Take 10 mg by mouth daily.   Yes [provider]  ferrous sulfate 325 (65 FE) MG tablet Take 1 tablet (325 mg total) by mouth daily with breakfast. 01/31/23 05/01/23 Yes Fritzi Mandes, MD  folic acid (FOLVITE) 1 MG tablet Take 1 tablet (1 mg total) by mouth daily. 02/01/23  Yes Fritzi Mandes, MD  furosemide (LASIX) 40 MG tablet Take 40 mg by mouth daily. 02/01/23  Yes [provider]  gabapentin (NEURONTIN) 300 MG capsule Take 1 capsule (300 mg total) by mouth 3 (three) times daily. 10/17/22  Yes Medina-Vargas, Monina C, NP  hydrocortisone (CORTEF) 20 MG tablet Take 1 tablet (20 mg total) by mouth daily. 02/01/23  Yes Fritzi Mandes, MD  isosorbide mononitrate (IMDUR) 30 MG 24 hr tablet Take 15 mg by mouth daily. 02/22/23  Yes [provider]  midodrine (PROAMATINE) 10 MG tablet Take 1 tablet (10 mg total) by  mouth 3 (three) times daily with meals. 01/31/23  Yes Fritzi Mandes, MD  pantoprazole (PROTONIX) 40 MG tablet TAKE 1 TABLET BY MOUTH EVERY DAY Patient taking differently: Take 40 mg by mouth 2 (two) times daily. 12/23/22  Yes Medina-Vargas, Monina C, NP  QUEtiapine (SEROQUEL) 25 MG tablet Take 1 tablet (25 mg total) by mouth at bedtime. 01/31/23  Yes Fritzi Mandes, MD  rosuvastatin (CRESTOR) 40 MG tablet TAKE 1 TABLET BY MOUTH EVERY DAY 12/19/22  Yes Medina-Vargas, Monina C, NP  thiamine (VITAMIN B-1) 100 MG tablet Take 1 tablet (100 mg total) by mouth daily. 02/01/23  Yes Fritzi Mandes, MD  torsemide (DEMADEX) 20 MG tablet Take 1 tablet (20 mg total) by mouth daily. 02/01/23  Yes Fritzi Mandes, MD  acetaminophen (TYLENOL) 500 MG tablet Take 2 tablets (1,000 mg total) by mouth every 6 (six) hours as needed for mild pain. Patient not taking: Reported on 01/17/2023 10/28/22   Olean Ree, MD  hydrocortisone (CORTEF) 10 MG tablet Take 1 tablet (10 mg total) by mouth daily. Patient not taking: Reported on  02/22/2023 01/31/23   Fritzi Mandes, MD  methocarbamol (ROBAXIN) 750 MG tablet TAKE 1 TABLET (750 MG TOTAL) BY MOUTH DAILY AS NEEDED FOR MUSCLE SPASMS 02/06/23   Medina-Vargas, Monina C, NP  nitroGLYCERIN (NITROSTAT) 0.4 MG SL tablet Place 1 tablet (0.4 mg total) under the tongue every 5 (five) minutes as needed for chest pain. 11/06/22   Enzo Bi, MD  oxyCODONE 10 MG TABS Take 1 tablet (10 mg total) by mouth every 8 (eight) hours as needed for severe pain. 01/31/23   Fritzi Mandes, MD    Physical Exam: Vitals:   02/22/23 0603 02/22/23 0618 02/22/23 0633 02/22/23 0758  BP: (!) 90/49 (!) 93/51 (!) 95/52 (!) 95/52  Pulse: (!) 110 (!) 114 (!) 103 (!) 106  Resp: '18 18 16   '$ Temp:  98.4 F (36.9 C) 98.2 F (36.8 C)   TempSrc:  Oral    SpO2: 98% 98% 99%    General: Not in acute distress.  Pale looking HEENT:       Eyes: PERRL, EOMI, no scleral icterus.       ENT: No discharge from the ears and nose, no pharynx injection, no tonsillar enlargement.        Neck: No JVD, no bruit, no mass felt. Heme: No neck lymph node enlargement. Cardiac: S1/S2, RRR, No murmurs, No gallops or rubs. Respiratory: No rales, wheezing, rhonchi or rubs. GI: Soft, nondistended, mild diffuse tenderness, no rebound pain, no organomegaly, BS present. GU: No hematuria Ext: No pitting leg edema bilaterally. 1+DP/PT pulse bilaterally. Musculoskeletal: No joint deformities, No joint redness or warmth, no limitation of ROM in spin. Skin: No rashes.  Neuro: Alert, oriented X3, cranial nerves II-XII grossly intact, moves all extremities normally. Psych: Patient is not psychotic, no suicidal or hemocidal ideation.  Labs on Admission: I have personally reviewed following labs and imaging studies  CBC: Recent Labs  Lab 02/22/23 0450  WBC 11.6*  NEUTROABS 8.4*  HGB 7.5*  HCT 23.7*  MCV 90.8  PLT 123XX123*   Basic Metabolic Panel: Recent Labs  Lab 02/22/23 0450  NA 139  K 3.7  CL 99  CO2 27  GLUCOSE 160*  BUN 30*   CREATININE 1.00  CALCIUM 8.7*  MG 1.6*  PHOS 2.9   GFR: CrCl cannot be calculated (Unknown ideal weight.). Liver Function Tests: Recent Labs  Lab 02/22/23 0450  AST 48*  ALT 15  ALKPHOS  122  BILITOT 3.4*  PROT 5.8*  ALBUMIN 2.4*   Recent Labs  Lab 02/22/23 0450  LIPASE 26   No results for input(s): "AMMONIA" in the last 168 hours. Coagulation Profile: Recent Labs  Lab 02/22/23 0450  INR 1.6*   Cardiac Enzymes: No results for input(s): "CKTOTAL", "CKMB", "CKMBINDEX", "TROPONINI" in the last 168 hours. BNP (last 3 results) No results for input(s): "PROBNP" in the last 8760 hours. HbA1C: No results for input(s): "HGBA1C" in the last 72 hours. CBG: No results for input(s): "GLUCAP" in the last 168 hours. Lipid Profile: Recent Labs    02/22/23 0450  CHOL 100  HDL 27*  LDLCALC 62  TRIG 53  CHOLHDL 3.7   Thyroid Function Tests: No results for input(s): "TSH", "T4TOTAL", "FREET4", "T3FREE", "THYROIDAB" in the last 72 hours. Anemia Panel: Recent Labs    02/22/23 0450  FOLATE 7.9  FERRITIN 203  TIBC 319  IRON 289*  RETICCTPCT 2.4   Urine analysis:    Component Value Date/Time   COLORURINE AMBER (A) 01/16/2023 2347   APPEARANCEUR HAZY (A) 01/16/2023 2347   APPEARANCEUR Clear 03/16/2012 1315   LABSPEC 1.015 01/16/2023 2347   LABSPEC 1.013 03/16/2012 1315   PHURINE 5.0 01/16/2023 2347   GLUCOSEU NEGATIVE 01/16/2023 2347   GLUCOSEU 50 mg/dL 03/16/2012 [place] 01/16/2023 2347   BILIRUBINUR NEGATIVE 01/16/2023 2347   BILIRUBINUR Negative 03/16/2012 [place] 01/16/2023 2347   PROTEINUR 30 (A) 01/16/2023 2347   NITRITE NEGATIVE 01/16/2023 2347   LEUKOCYTESUR NEGATIVE 01/16/2023 2347   LEUKOCYTESUR Negative 03/16/2012 1315   Sepsis Labs: '@LABRCNTIP'$ (procalcitonin:4,lacticidven:4) )No results found for this or any previous visit (from the past 240 hour(s)).   Radiological Exams on Admission: No results  found.    Assessment/Plan Principal Problem:   GI bleeding Active Problems:   Acute blood loss anemia   Adrenal insufficiency (HCC)   CAD (coronary artery disease)   Myocardial injury   Hypomagnesemia   Chronic systolic CHF (congestive heart failure) (HCC)   HLD (hyperlipidemia)   Thrombocytopenia (HCC)   Essential hypertension   Depression with anxiety   Polysubstance abuse ([place])   Morbid obesity (HCC)   Assessment and Plan:    GI bleeding and acute blood loss anemia: Hgb 10.7 --> 7.5.  Patient initially was hypotensive, which responded to fluid resuscitation, Currently blood pressure 92/53 (MAP 65), blood pressure still soft, still has tachycardia.  Consulted with Dr. Marius Ditch for GI.  Patient has a diffuse abdominal pain, but abdomen is soft on examination, low suspicions for perforation.  Patient may have alcoholic liver cirrhosis, with start Rocephin empirically to cover possible SBP.  - will admitted to SDU as inpatient - transfuse 2 units of blood now - IV rocephin - IVF: 3L LR bolus, then 100 mL/hr of NS - Start IV pantoprazole gtt - Start Octreotide gtt - Check anemia panel - Zofran IV for nausea - Avoid NSAIDs and SQ heparin - Maintain IV access (2 large bore IVs if possible). - Monitor closely and follow q6h cbc, transfuse as necessary, if Hgb<7.0 - LaB: INR, PTT and type screen -check ammonia level -Continue iron supplement  Addendum: ammonia level is 56 -will start lactulose 20 g bid  Adrenal insufficiency (HCC) -Give Solu-Cortef 100 mg x 2 doses as stress doses -Continue home Cortef 20 mg daily tomorrow -Continue home midodrine 10 mg 3 times daily for hypotension  CAD (coronary artery disease) and myocardial injury: Troponin 46.  History of STEMI  with BMS placement -Will not give aspirin due to GI bleeding -Crestor, Zetia -Trend troponin -Check A1c, FLP  Hypomagnesemia: Mg 1.6. K 3.7 -Repleted magnesium -Check phosphorus level  Chronic systolic  CHF (congestive heart failure) [facility]): Patient had history of systolic CHF.  2D echo on 07/13/2019 showed EF of 45-50%.  His EF has improved in recent years.  2D echo on 01/17/2023 showed EF of 50-55%.  Patient does not have leg edema or JVD.  CHF is compensated today -Hold diuretics due to hypotension -Check BNP  HLD (hyperlipidemia) -Crestor, Zetia  Thrombocytopenia ([place]): This is a chronic issue.  Platelet 103 on 11/21/2022, 158 on 01/29/2023.  Today platelet 108.  Likely due to alcohol abuse and possible liver cirrhosis. -Follow-up with CBC  Essential hypertension -Hold off blood pressure medications and diuretics due to hypotension -Hold Imdur nitroglycerin  Depression with anxiety -Seroquel  Polysubstance abuse: Including cocaine, tobacco, alcohol, marijuana -Did counseling about importance of quitting substance use -Nicotine patch -CIWA protocol -Check UDS  Morbid obesity ([place]): Body weight while 4.2 kg, BMI 38.22 -Healthy diet and exercise -Encouraged losing weight        DVT ppx: SCD  Code Status: Full code  Family Communication: Yes, patient's mother by phone  Disposition Plan:  Anticipate discharge back to previous environment  Consults called:  Dr. Marius Ditch  Admission status and Level of care: Stepdown: as inpt       Dispo: The patient is from: Home              Anticipated d/c is to: Home              Anticipated d/c date is: 2 days              Patient currently is not medically stable to d/c.    Severity of Illness:  The appropriate patient status for this patient is INPATIENT. Inpatient status is judged to be reasonable and necessary in order to provide the required intensity of service to ensure the patient's safety. The patient's presenting symptoms, physical exam findings, and initial radiographic and laboratory data in the context of their chronic comorbidities is felt to place them at high risk for further clinical deterioration. Furthermore, it is  not anticipated that the patient will be medically stable for discharge from the hospital within 2 midnights of admission.   * I certify that at the point of admission it is my clinical judgment that the patient will require inpatient hospital care spanning beyond 2 midnights from the point of admission due to high intensity of service, high risk for further deterioration and high frequency of surveillance required.*       Date of Service 02/22/2023    Ivor Costa [facility]   If 7PM-7AM, please contact night-coverage [email or web address] 02/22/2023, 8:14 AM

## 2023-02-22 NOTE — ED Provider Notes (Signed)
[facility] ([facility]) Provider Note    Event Date/Time   First MD Initiated Contact with Patient 02/22/23 0424     (approximate)   History   GI Problem and Rectal Bleeding   HPI  Ethan Wells is a 52 y.o. male who presents to the ED for evaluation of GI Problem and Rectal Bleeding   I reviewed medical DC summary from 2/16 where he was admitted for NSTEMI, hemorrhoidal bleeding requiring transfusion.  History of CHF, HTN, alcohol abuse cocaine abuse, mesenteric stenosis with a stent placed 4 months ago.  Patient presents to the ED from home by EMS for evaluation of GI bleeding.  He reports feeling sick for couple days but in the past few hours has had recurrent coffee-ground and bloody emesis alongside bloody and black and tarry stools.  EMS reports significant amount of blood at home, pools of blood coagulating on the floor, trash basin full of blood with large clots.  Reports possible syncope but he is uncertain about this.   Physical Exam   Triage Vital Signs: ED Triage Vitals  Enc Vitals Group     BP      Pulse      Resp      Temp      Temp src      SpO2      Weight      Height      Head Circumference      Peak Flow      Pain Score      Pain Loc      Pain Edu?      Excl. in [place]?     Most recent vital signs: Vitals:   02/22/23 0500 02/22/23 0530  BP: 120/81 131/82  Pulse: (!) 125 (!) 116  Resp: 20 20  Temp: 97.6 F (36.4 C)   SpO2: 96% 99%    General: Awake, no distress.  Pale and looks unwell.  Clothes stained with blood CV:  Good peripheral perfusion.  Resp:  Normal effort.  Abd:  Mildly distended.  No peritoneal features MSK:  No deformity noted.  Neuro:  No focal deficits appreciated. Other:     ED Results / Procedures / Treatments   Labs (all labs ordered are listed, but only abnormal results are displayed) Labs Reviewed  PROTIME-INR - Abnormal; Notable for the following components:      Result Value   Prothrombin Time  18.7 (*)    INR 1.6 (*)    All other components within normal limits  COMPREHENSIVE METABOLIC PANEL - Abnormal; Notable for the following components:   Glucose, Bld 160 (*)    BUN 30 (*)    Calcium 8.7 (*)    Total Protein 5.8 (*)    Albumin 2.4 (*)    AST 48 (*)    Total Bilirubin 3.4 (*)    All other components within normal limits  CBC WITH DIFFERENTIAL/PLATELET - Abnormal; Notable for the following components:   WBC 11.6 (*)    RBC 2.61 (*)    Hemoglobin 7.5 (*)    HCT 23.7 (*)    RDW 20.1 (*)    Platelets 108 (*)    Neutro Abs 8.4 (*)    Abs Immature Granulocytes 0.08 (*)    All other components within normal limits  MAGNESIUM - Abnormal; Notable for the following components:   Magnesium 1.6 (*)    All other components within normal limits  TROPONIN I (HIGH SENSITIVITY) - Abnormal;  Notable for the following components:   Troponin I (High Sensitivity) 46 (*)    All other components within normal limits  LIPASE, BLOOD  APTT  URINALYSIS, ROUTINE W REFLEX MICROSCOPIC  TYPE AND SCREEN  PREPARE RBC (CROSSMATCH)  PREPARE RBC (CROSSMATCH)  TROPONIN I (HIGH SENSITIVITY)    EKG Sinus tachycardia with rate of 122 bpm.  Normal axis and intervals.  Nonspecific ST changes without STEMI.  Couple PVCs  RADIOLOGY   Official radiology report(s): No results found.  PROCEDURES and INTERVENTIONS:  .1-3 Lead EKG Interpretation  Performed by: Vladimir Crofts, MD Authorized by: Vladimir Crofts, MD     Interpretation: abnormal     ECG rate:  124   ECG rate assessment: tachycardic     Rhythm: sinus tachycardia     Ectopy: none     Conduction: normal   .Critical Care  Performed by: Vladimir Crofts, MD Authorized by: Vladimir Crofts, MD   Critical care provider statement:    Critical care time (minutes):  30   Critical care time was exclusive of:  Separately billable procedures and treating other patients   Critical care was necessary to treat or prevent imminent or life-threatening  deterioration of the following conditions:  Shock and circulatory failure   Critical care was time spent personally by me on the following activities:  Development of treatment plan with patient or surrogate, discussions with consultants, evaluation of patient's response to treatment, examination of patient, ordering and review of laboratory studies, ordering and review of radiographic studies, ordering and performing treatments and interventions, pulse oximetry, re-evaluation of patient's condition and review of old charts Ultrasound ED Peripheral IV (Provider)  Date/Time: 02/22/2023 5:59 AM  Performed by: Vladimir Crofts, MD Authorized by: Vladimir Crofts, MD   Procedure details:    Indications: hypotension, multiple failed IV attempts and poor IV access     Skin Prep: chlorhexidine gluconate     Location:  Right AC   Angiocath:  18 G   Bedside Ultrasound Guided: Yes     Images: not archived     Patient tolerated procedure without complications: Yes     Dressing applied: Yes   Ultrasound ED Peripheral IV (Provider)  Date/Time: 02/22/2023 5:59 AM  Performed by: Vladimir Crofts, MD Authorized by: Vladimir Crofts, MD   Procedure details:    Indications: hydration, hypotension, multiple failed IV attempts and poor IV access     Skin Prep: chlorhexidine gluconate     Location: left basilic v.   Angiocath:  18 G   Bedside Ultrasound Guided: Yes     Images: not archived     Patient tolerated procedure without complications: Yes     Dressing applied: Yes     Medications  pantoprozole (PROTONIX) 80 mg /NS 100 mL infusion (8 mg/hr Intravenous New Bag/Given 02/22/23 0457)  pantoprazole (PROTONIX) injection 40 mg (has no administration in time range)  octreotide (SANDOSTATIN) 2 mcg/mL load via infusion 50 mcg (50 mcg Intravenous Bolus from Bag 02/22/23 0529)    And  octreotide (SANDOSTATIN) 500 mcg in sodium chloride 0.9 % 250 mL (2 mcg/mL) infusion (50 mcg/hr Intravenous New Bag/Given 02/22/23 0528)   lactated ringers bolus 1,000 mL (1,000 mLs Intravenous Not Given 02/22/23 0458)  0.9 %  sodium chloride infusion (has no administration in time range)  magnesium sulfate IVPB 2 g 50 mL (2 g Intravenous New Bag/Given 02/22/23 0544)  0.9 %  sodium chloride infusion (10 mL/hr Intravenous Not Given 02/22/23 0555)  0.9 %  sodium chloride  infusion (has no administration in time range)  acetaminophen (TYLENOL) tablet 650 mg (has no administration in time range)    Or  acetaminophen (TYLENOL) suppository 650 mg (has no administration in time range)  traZODone (DESYREL) tablet 25 mg (has no administration in time range)  metoCLOPramide (REGLAN) injection 10 mg (has no administration in time range)  pantoprazole (PROTONIX) 80 mg /NS 100 mL IVPB (0 mg Intravenous Stopped 02/22/23 0510)  cefTRIAXone (ROCEPHIN) 2 g in sodium chloride 0.9 % 100 mL IVPB (0 g Intravenous Stopped 02/22/23 0515)  lactated ringers bolus 1,000 mL (0 mLs Intravenous Stopped 02/22/23 0525)  metoCLOPramide (REGLAN) injection 10 mg (10 mg Intravenous Given 02/22/23 0447)  lactated ringers bolus 1,000 mL (0 mLs Intravenous Stopped 02/22/23 0525)  morphine (PF) 4 MG/ML injection 4 mg (4 mg Intravenous Given 02/22/23 0546)     IMPRESSION / MDM / ASSESSMENT AND PLAN / ED COURSE  I reviewed the triage vital signs and the nursing notes.  Differential diagnosis includes, but is not limited to, upper GI bleed, lower GI bleed, pancreatitis, perforated viscus, bleeding varices, hemoptysis  {Patient presents with symptoms of an acute illness or injury that is potentially life-threatening.  Alcoholic patient presents with upper GI bleeding requiring transfusions and admission.  He is tachycardic with soft pressures on arrival, improving with fluid resuscitation as we await blood products.  First CBC demonstrates a 3 point hemoglobin drop down to 7.5.  Considering his ongoing melena here in the ED and reported home emesis, we will go and start transfusing  PRBCs.  Mild hypomagnesemia is repleted IV.  Potassium is at normal limits.  Troponin is slightly elevated and possibly due to subendocardial ischemia in the setting with soft pressures.  We will continue to trend this.  EKG is fairly reassuring.  Consulted medicine who agrees to admit  Clinical Course as of 02/22/23 0559  Sat Feb 22, 2023  0455 I place x2 USIVs, 18g. Left basilic, right AC [DS]  [id number] Reassessed.  Heart rate improving.  BP improving.  I consulted medicine who agrees to admit.  They requested a second unit of PRBCs ordered, which I do [DS]    Clinical Course User Index [DS] Vladimir Crofts, MD     FINAL CLINICAL IMPRESSION(S) / ED DIAGNOSES   Final diagnoses:  Upper GI bleed     Rx / DC Orders   ED Discharge Orders     None        Note:  This document was prepared using Dragon voice recognition software and may include unintentional dictation errors.   Vladimir Crofts, MD 02/22/23 [id number]

## 2023-02-22 NOTE — Progress Notes (Addendum)
       CROSS COVER NOTE  NAME: SAYID MOLL MRN: [id number] DOB : [birth year]    HPI/Events of Note   Report: Message received via secure chat patient lethargic and low blood pressure systolic in 50s  On review of chart: Patient admitted with hx of but not limited to alcohol abuse, erosive esophagitis, CAD, polysubstance abuse  and ischemic cardiomyopathy who presented with hematemesis and melana., possible cirrhosis   He has received 1 u PRBC for hgb  5.8. Seen by GI started on octreiotide, rocephin, protonix, and rifaximin and   Review of meds, multiple sedating agents started on midodrine earlier in day for hypotension    Assessment and  Interventions   Assessment: Neuro - lethargic but awakens and is oriented x4, MAE equally weakly PERR, mild icteric sclera SR Respirations shallow, unlabored 1+ edema generalized No current over bleeding ;reports one BM today Chem panel and ammonia level repeat along with vbg and hgb Ammonia level improved,  Vbg mixed resp alkalosis Worsening hypoalbuminemia Mag now at 2.; K 3.1  Plan: 500 cc LR Additional 10 mg midodrine 25 gm albumin x 1 Hold all sedating meds Additional unit PRBC (with cardiac history, goal should be above 8) Continue to monitor        Donnie Mesa NP [facility]

## 2023-02-22 NOTE — Consult Note (Signed)
Cephas Darby, MD [address]  [place]  [place], [place] [address]  Main: [phone]  Fax: [phone] Pager: [phone]   Consultation  Referring Provider:     No ref. provider found Primary Care Physician:  Nickola Major, NP Primary Gastroenterologist:  Dr. Sherri Sear        Reason for Consultation:     Hematemesis, melena  Date of Admission:  02/22/2023 Date of Consultation:  02/22/2023         HPI:   Ethan Wells is a 52 y.o. male history of alcohol dependence, alcohol abuse, erosive esophagitis presented with hematemesis and melena and acute blood loss anemia.  For last 2 days, he has been feeling sick, has been throwing up coffee-ground and bloody emesis along with black tarry stools.  EMS reported a significant amount of blood being noticed at home.  Patient was hypotensive on arrival, which improved with aggressive fluid resuscitation.  His hemoglobin on arrival was 7.5, at baseline 10.7, platelets 108.  Patient received 1 unit of PRBCs.  Current hemoglobin is 8.  Elevated BUN/creatinine 30/1.0.  Patient is started on octreotide drip, pantoprazole drip and ceftriaxone for SBP prophylaxis.  His previous imaging from 01/2023 revealed possible cirrhosis of liver   Patient is in the ICU, when I interviewed, he reported mild epigastric discomfort, denies any nausea or vomiting, his last drink was yesterday  Patient underwent hemorrhoidectomy November 2023  NSAIDs: Ibuprofen about to 2 pills daily for pain  Antiplts/Anticoagulants/Anti thrombotics: None  GI Procedures:  Flexible sigmoidoscopy for hematochezia 09/22/2022 Moderate size internal hemorrhoids, examined colon to 35 cm appeared normal  Upper endoscopy 05/30/2022 LA grade D esophagitis, long segment Barrett's esophagus, portal hypertensive gastropathy DIAGNOSIS:  A. ESOPHAGUS; COLD BIOPSY:  - BARRETT'S ESOPHAGUS.  - STRATIFIED SQUAMOUS EPITHELIUM WITHOUT INFLAMMATION OR REACTIVE   CHANGES.  - NEGATIVE FOR DYSPLASIA AND MALIGNANCY.    Past Medical History:  Diagnosis Date   CAD (coronary artery disease)    a. 01/2011 Anterior STEMI/Cath/PCI: LM nl, LAD 100d (3.5x28m Vision BMS placed), LCX 216mRI 50, RCA min irregs, EF 40% w/ apical, inferoapical HK; b. 10/2022 NSTSEMI/Cath: LM nl, LAD 5p/m, RI 85, LCX 10038mM1 mild dzs, OM3 100 fills via L->L collats from dLAD, RCA 80p, 100m80mDA fills via collats from LAD. EF 45-50%-->Med Rx.   Cardiac arrest - ventricular fibrillation    a. In setting of STEMI 01/2011   Chronic pain    Cocaine abuse (HCC)[place] Depression with anxiety    ETOH abuse    a. 6-12 beers/day   GERD (gastroesophageal reflux disease)    GI bleed    Hemorrhoids    Hyperlipidemia    Hypertension    Ischemic cardiomyopathy    a. 06/2011 Echo: EF 45-50%, No rwma; b. 10/2022 Echo: EF 55-60%, no rwma, nl RV fxn.   Marijuana abuse    Migraines    Tobacco abuse    a. 1/2 ppd x 26 yrs    Past Surgical History:  Procedure Laterality Date   COLONOSCOPY  08/09/2021   Procedure: COLONOSCOPY;  Surgeon: WohlLucilla Lame;  [facility];  Service: Endoscopy;;   CORONARY ANGIOPLASTY WITH STENT PLACEMENT     ESOPHAGOGASTRODUODENOSCOPY N/A 05/30/2022   Procedure: ESOPHAGOGASTRODUODENOSCOPY (EGD);  Surgeon: VangLin Landsman;  [facility];  Service: Gastroenterology;  Laterality: N/A;   ESOPHAGOGASTRODUODENOSCOPY (EGD) WITH PROPOFOL N/A 03/01/2021   Procedure: ESOPHAGOGASTRODUODENOSCOPY (EGD) WITH PROPOFOL;  Surgeon:  Lin Landsman, MD;  Location: [place];  Service: Endoscopy;  Laterality: N/A;   ESOPHAGOGASTRODUODENOSCOPY (EGD) WITH PROPOFOL N/A 08/09/2021   Procedure: ESOPHAGOGASTRODUODENOSCOPY (EGD) WITH PROPOFOL;  Surgeon: Lucilla Lame, MD;  Location: [facility] ENDOSCOPY;  Service: Endoscopy;  Laterality: N/A;   EVALUATION UNDER ANESTHESIA WITH HEMORRHOIDECTOMY N/A 10/31/2022   Procedure: EXAM UNDER ANESTHESIA WITH  SUTURE LIGATION OF TWO BLEEDING PEDICLES;  Surgeon: Olean Ree, MD;  Location: [facility];  Service: General;  Laterality: N/A;   FLEXIBLE SIGMOIDOSCOPY N/A 10/02/2022   Procedure: FLEXIBLE SIGMOIDOSCOPY;  Surgeon: Ladene Artist, MD;  Location: [place];  Service: Gastroenterology;  Laterality: N/A;   LEFT HEART CATH AND CORONARY ANGIOGRAPHY N/A 11/04/2022   Procedure: LEFT HEART CATH AND CORONARY ANGIOGRAPHY;  Surgeon: Wellington Hampshire, MD;  Location: [place] CV LAB;  Service: Cardiovascular;  Laterality: N/A;   VISCERAL ANGIOGRAPHY N/A 10/24/2022   Procedure: VISCERAL ANGIOGRAPHY;  Surgeon: Algernon Huxley, MD;  Location: [place] CV LAB;  Service: Cardiovascular;  Laterality: N/A;     Current Facility-Administered Medications:    0.9 %  sodium chloride infusion, 10 mL/hr, Intravenous, Once, Vladimir Crofts, MD   0.9 %  sodium chloride infusion, , Intravenous, Continuous, Mansy, Arvella Merles, MD, Last Rate: 100 mL/hr at 02/22/23 0921, New Bag at 02/22/23 [id number]   acetaminophen (TYLENOL) tablet 650 mg, 650 mg, Oral, Q6H PRN **OR** acetaminophen (TYLENOL) suppository 650 mg, 650 mg, Rectal, Q6H PRN, Mansy, Jan A, MD   [START ON 02/23/2023] cefTRIAXone (ROCEPHIN) 2 g in sodium chloride 0.9 % 100 mL IVPB, 2 g, Intravenous, Q24H, Ivor Costa, MD   Chlorhexidine Gluconate Cloth 2 % PADS 6 each, 6 each, Topical, Q0600, Ivor Costa, MD, 6 each at 02/22/23 0910   ezetimibe (ZETIA) tablet 10 mg, 10 mg, Oral, Daily, Ivor Costa, MD, 10 mg at 02/22/23 1002   ferrous sulfate tablet 325 mg, 325 mg, Oral, Q breakfast, Ivor Costa, MD, 325 mg at [id number]   folic acid (FOLVITE) tablet 1 mg, 1 mg, Oral, Daily, Ivor Costa, MD, 1 mg at 02/22/23 [id number]   gabapentin (NEURONTIN) capsule 300 mg, 300 mg, Oral, TID, Ivor Costa, MD, 300 mg at 02/22/23 0921   [START ON 02/23/2023] hydrocortisone (CORTEF) tablet 20 mg, 20 mg, Oral, Daily, Ivor Costa, MD   hydrocortisone sodium succinate (SOLU-CORTEF) 100 MG injection 100  mg, 100 mg, Intravenous, Q12H, Ivor Costa, MD, 100 mg at 02/22/23 0922   [START ON 02/23/2023] influenza vac split quadrivalent PF (FLUARIX) injection 0.5 mL, 0.5 mL, Intramuscular, Tomorrow-1000, Niu, Soledad Gerlach, MD   lactated ringers bolus 1,000 mL, 1,000 mL, Intravenous, Once, Vladimir Crofts, MD   LORazepam (ATIVAN) tablet 0-4 mg, 0-4 mg, Oral, Q6H, 1 mg at 02/22/23 1333 **FOLLOWED BY** [START ON 02/24/2023] LORazepam (ATIVAN) tablet 0-4 mg, 0-4 mg, Oral, Q12H, Ivor Costa, MD   methocarbamol (ROBAXIN) tablet 750 mg, 750 mg, Oral, Daily PRN, Ivor Costa, MD, 750 mg at 02/22/23 1002   metoCLOPramide (REGLAN) injection 10 mg, 10 mg, Intravenous, Q6H PRN, Mansy, Jan A, MD, 10 mg at 02/22/23 1049   midodrine (PROAMATINE) tablet 10 mg, 10 mg, Oral, TID WC, Ivor Costa, MD, 10 mg at 02/22/23 1327   multivitamin with minerals tablet 1 tablet, 1 tablet, Oral, Daily, Ivor Costa, MD, 1 tablet at 02/22/23 [id number]   nicotine (NICODERM CQ - dosed in mg/24 hours) patch 21 mg, 21 mg, Transdermal, Daily, Ivor Costa, MD, 21 mg at 02/22/23 0922   [COMPLETED] octreotide (SANDOSTATIN) 2 mcg/mL load  via infusion 50 mcg, 50 mcg, Intravenous, Once, 50 mcg at 02/22/23 0529 **AND** octreotide (SANDOSTATIN) 500 mcg in sodium chloride 0.9 % 250 mL (2 mcg/mL) infusion, 50 mcg/hr, Intravenous, Continuous, Vladimir Crofts, MD, Last Rate: 25 mL/hr at 02/22/23 0528, 50 mcg/hr at 02/22/23 0528   Oral care mouth rinse, 15 mL, Mouth Rinse, PRN, Ivor Costa, MD   oxyCODONE (Oxy IR/ROXICODONE) immediate release tablet 10 mg, 10 mg, Oral, Q8H PRN, Ivor Costa, MD   [START ON 02/25/2023] pantoprazole (PROTONIX) injection 40 mg, 40 mg, Intravenous, Q12H, Vladimir Crofts, MD   pantoprozole (PROTONIX) 80 mg /NS 100 mL infusion, 8 mg/hr, Intravenous, Continuous, Vladimir Crofts, MD, Last Rate: 10 mL/hr at 02/22/23 0457, 8 mg/hr at 02/22/23 0457   QUEtiapine (SEROQUEL) tablet 25 mg, 25 mg, Oral, QHS, Niu, Soledad Gerlach, MD   rifaximin Doreene Nest) tablet 550 mg, 550 mg, Oral,  BID, Tim[place]lee Roblero, Tally Due, MD, 550 mg at 02/22/23 1333   rosuvastatin (CRESTOR) tablet 40 mg, 40 mg, Oral, Daily, Ivor Costa, MD, 40 mg at 02/22/23 [id number]   thiamine (VITAMIN B1) tablet 100 mg, 100 mg, Oral, Daily, 100 mg at 02/22/23 [id number] **OR** thiamine (VITAMIN B1) injection 100 mg, 100 mg, Intravenous, Daily, Ivor Costa, MD   traZODone (DESYREL) tablet 25 mg, 25 mg, Oral, QHS PRN, Mansy, Arvella Merles, MD   Family History  Problem Relation Age of Onset   Coronary artery disease Mother        alive   Other Other        2 sisters alive and well   Alcohol abuse Father        died @ 65   Other Paternal Grandmother        PACER     Social History   Tobacco Use   Smoking status: Every Day    Packs/day: 0.50    Years: 41.00    Total pack years: 20.50    Types: Cigarettes   Smokeless tobacco: Never   Tobacco comments:    started age 26  Vaping Use   Vaping Use: Never used  Substance Use Topics   Alcohol use: Yes    Alcohol/week: 12.0 standard drinks of alcohol    Types: 12 Cans of beer per week    Comment: at least a 6 pack of beer daily and often a 12 pack   Drug use: Not Currently    Types: Cocaine, Marijuana    Allergies as of 02/22/2023 - Review Complete 02/22/2023  Allergen Reaction Noted   Penicillins Hives 02/26/2011   Other Rash 02/22/2021    Review of Systems:    All systems reviewed and negative except where noted in HPI.   Physical Exam:  Vital signs in last 24 hours: Temp:  [97.6 F (36.4 C)-99.2 F (37.3 C)] 99.2 F (37.3 C) (03/09 1328) Pulse Rate:  [103-125] 106 (03/09 1328) Resp:  [14-30] 15 (03/09 1328) BP: (74-131)/(47-82) 119/57 (03/09 1328) SpO2:  [94 %-100 %] 95 % (03/09 1328) Weight:  [84.6 kg] 84.6 kg (03/09 0905)   General:   Pleasant, cooperative in NAD Head:  Normocephalic and atraumatic. Eyes: Positive icterus.   Conjunctiva pink. PERRLA. Ears:  Normal auditory acuity. Neck:  Supple; no masses or thyroidomegaly Lungs: Respirations even and  unlabored. Lungs clear to auscultation bilaterally.   No wheezes, crackles, or rhonchi.  Heart:  Regular rate and rhythm;  Without murmur, clicks, rubs or gallops Abdomen:  Soft, nondistended, nontender. Normal bowel sounds. No appreciable masses or hepatomegaly.  No rebound  or guarding.  Rectal:  Not performed. Msk:  Symmetrical without gross deformities.  Strength generalized weakness Extremities:  Without edema, cyanosis or clubbing. Neurologic:  Alert and oriented x3;  grossly normal neurologically. Skin:  Intact without significant lesions or rashes. Psych:  Alert and cooperative. Normal affect.  LAB RESULTS:    Latest Ref Rng & Units 02/22/2023   11:16 AM 02/22/2023    9:18 AM 02/22/2023    4:50 AM  CBC  WBC 4.0 - 10.5 K/uL 11.6  10.5  11.6   Hemoglobin 13.0 - 17.0 g/dL 8.0  8.0  7.5   Hematocrit 39.0 - 52.0 % 24.2  23.8  23.7   Platelets 150 - 400 K/uL 130  103  108     BMET    Latest Ref Rng & Units 02/22/2023    4:50 AM 01/29/2023    5:36 AM 01/28/2023    4:50 AM  BMP  Glucose 70 - 99 mg/dL 160  152  106   BUN 6 - 20 mg/dL '30  17  15   '$ Creatinine 0.61 - 1.24 mg/dL 1.00  1.07  1.07   Sodium 135 - 145 mmol/L 139  135  134   Potassium 3.5 - 5.1 mmol/L 3.7  3.8  3.3   Chloride 98 - 111 mmol/L 99  101  98   CO2 22 - 32 mmol/L '27  24  27   '$ Calcium 8.9 - 10.3 mg/dL 8.7  9.0  8.9     LFT    Latest Ref Rng & Units 02/22/2023    4:50 AM 01/19/2023    4:22 AM 01/18/2023    2:04 AM  Hepatic Function  Total Protein 6.5 - 8.1 g/dL 5.8  6.8  6.7   Albumin 3.5 - 5.0 g/dL 2.4  2.7  2.6   AST 15 - 41 U/L 48  103  90   ALT 0 - 44 U/L 15  32  28   Alk Phosphatase 38 - 126 U/L 122  225  222   Total Bilirubin 0.3 - 1.2 mg/dL 3.4  4.1  3.1   Bilirubin, Direct 0.0 - 0.2 mg/dL  1.8       STUDIES: No results found.    Impression / Plan:   ARDIS VOTTO is a 52 y.o. male with alcohol abuse, possible cirrhosis of liver, hypertension, hyperlipidemia, history of coronary artery  disease, STEMI and V-fib arrest, history of erosive esophagitis, history of hemorrhoidectomy is admitted with hematemesis and melena with acute blood loss anemia with ongoing alcohol use  Hematemesis and melena with acute blood loss anemia Continue pantoprazole drip, octreotide drip Continue ceftriaxone for SBP prophylaxis Okay to start clear liquid diet Monitor CBC closely to maintain hemoglobin above 8 Maintain platelets above 50 Will proceed with upper endoscopy tomorrow  ?  Cirrhosis of liver, alcohol dependence EGD as above to rule out any esophageal varices Start rifaximin 550 mg twice daily for hepatic encephalopathy No indication for prednisone for acute alcoholic hepatitis given low discriminant function Does not appear volume overloaded, hold diuretics Patient will need long-term follow-up for management of chronic alcoholic liver disease Will benefit from inpatient alcohol rehab program  Thank you for involving me in the care of this patient.      LOS: 0 days   Sherri Sear, MD  02/22/2023, 1:34 PM    Note: This dictation was prepared with Dragon dictation along with smaller phrase technology. Any transcriptional errors that result from this process are  unintentional.

## 2023-02-22 NOTE — ED Notes (Signed)
Pt was covered in black bloody stool. Pt was cleaned, clothes removed and linens replaced.

## 2023-02-22 NOTE — ED Triage Notes (Signed)
Pt called ems, has been vomiting blood and rectal bleeding for the last 12 hrs. Pt has hx of GI bleed, and alcoholism.

## 2023-02-23 ENCOUNTER — Encounter: Payer: Self-pay | Admitting: Anesthesiology

## 2023-02-23 ENCOUNTER — Encounter
Admission: EM | Disposition: A | Discharge status: Home Health | Payer: Self-pay | Source: Home / Self Care | Attending: Student

## 2023-02-23 DIAGNOSIS — K709 Alcoholic liver disease, unspecified: Secondary | ICD-10-CM | POA: Diagnosis not present

## 2023-02-23 DIAGNOSIS — D62 Acute posthemorrhagic anemia: Secondary | ICD-10-CM | POA: Diagnosis not present

## 2023-02-23 DIAGNOSIS — K922 Gastrointestinal hemorrhage, unspecified: Secondary | ICD-10-CM | POA: Diagnosis not present

## 2023-02-23 LAB — CBC
HCT: 23.1 % — ABNORMAL LOW (ref 39.0–52.0)
HCT: 23.9 % — ABNORMAL LOW (ref 39.0–52.0)
Hemoglobin: 7.9 g/dL — ABNORMAL LOW (ref 13.0–17.0)
Hemoglobin: 8.1 g/dL — ABNORMAL LOW (ref 13.0–17.0)
MCH: 29.5 pg (ref 26.0–34.0)
MCH: 29.2 pg (ref 26.0–34.0)
MCHC: 34.2 g/dL (ref 30.0–36.0)
MCHC: 33.9 g/dL (ref 30.0–36.0)
MCV: 86.2 fL (ref 80.0–100.0)
MCV: 86.3 fL (ref 80.0–100.0)
Platelets: 88 10*3/uL — ABNORMAL LOW (ref 150–400)
Platelets: 87 10*3/uL — ABNORMAL LOW (ref 150–400)
RBC: 2.68 MIL/uL — ABNORMAL LOW (ref 4.22–5.81)
RBC: 2.77 MIL/uL — ABNORMAL LOW (ref 4.22–5.81)
RDW: 19.7 % — ABNORMAL HIGH (ref 11.5–15.5)
RDW: 19.9 % — ABNORMAL HIGH (ref 11.5–15.5)
WBC: 7.5 10*3/uL (ref 4.0–10.5)
WBC: 9.8 10*3/uL (ref 4.0–10.5)
nRBC: 0 % (ref 0.0–0.2)
nRBC: 0 % (ref 0.0–0.2)

## 2023-02-23 LAB — BPAM RBC
Blood Product Expiration Date: 202403132359
Blood Product Expiration Date: 202404042359
Blood Product Expiration Date: 202404042359
ISSUE DATE / TIME: 202403090612
ISSUE DATE / TIME: 202403091137
ISSUE DATE / TIME: 202403092243
Unit Type and Rh: 6200
Unit Type and Rh: 6200
Unit Type and Rh: 6200

## 2023-02-23 LAB — TYPE AND SCREEN
ABO/RH(D): A POS
Antibody Screen: NEGATIVE
Unit division: 0
Unit division: 0
Unit division: 0

## 2023-02-23 LAB — BASIC METABOLIC PANEL
Anion gap: 6 (ref 5–15)
BUN: 20 mg/dL (ref 6–20)
CO2: 25 mmol/L (ref 22–32)
Calcium: 7.6 mg/dL — ABNORMAL LOW (ref 8.9–10.3)
Chloride: 108 mmol/L (ref 98–111)
Creatinine, Ser: 0.59 mg/dL — ABNORMAL LOW (ref 0.61–1.24)
GFR, Estimated: >60 mL/min (ref >60)
Glucose, Bld: 146 mg/dL — ABNORMAL HIGH (ref 70–99)
Potassium: 3.3 mmol/L — ABNORMAL LOW (ref 3.5–5.1)
Sodium: 139 mmol/L (ref 135–145)

## 2023-02-23 LAB — URINE DRUG SCREEN, QUALITATIVE (ARMC ONLY)
Amphetamines, Ur Screen: NOT DETECTED
Barbiturates, Ur Screen: NOT DETECTED
Benzodiazepine, Ur Scrn: POSITIVE — AB
Cannabinoid 50 Ng, Ur ~~LOC~~: NOT DETECTED
Cocaine Metabolite,Ur ~~LOC~~: POSITIVE — AB
MDMA (Ecstasy)Ur Screen: NOT DETECTED
Methadone Scn, Ur: NOT DETECTED
Opiate, Ur Screen: POSITIVE — AB
Phencyclidine (PCP) Ur S: NOT DETECTED
Tricyclic, Ur Screen: NOT DETECTED

## 2023-02-23 LAB — TROPONIN I (HIGH SENSITIVITY)
Troponin I (High Sensitivity): 101 ng/L (ref <18)
Troponin I (High Sensitivity): 98 ng/L — ABNORMAL HIGH (ref <18)
Troponin I (High Sensitivity): 84 ng/L — ABNORMAL HIGH (ref <18)
Troponin I (High Sensitivity): 120 ng/L (ref <18)

## 2023-02-23 LAB — HEMOGLOBIN AND HEMATOCRIT, BLOOD
HCT: 26.6 % — ABNORMAL LOW (ref 39.0–52.0)
Hemoglobin: 9 g/dL — ABNORMAL LOW (ref 13.0–17.0)

## 2023-02-23 LAB — MAGNESIUM: Magnesium: 2 mg/dL (ref 1.7–2.4)

## 2023-02-23 LAB — PHOSPHORUS: Phosphorus: 2.1 mg/dL — ABNORMAL LOW (ref 2.5–4.6)

## 2023-02-23 LAB — VITAMIN D 25 HYDROXY (VIT D DEFICIENCY, FRACTURES): Vit D, 25-Hydroxy: 37.41 ng/mL (ref 30–100)

## 2023-02-23 SURGERY — ESOPHAGOGASTRODUODENOSCOPY (EGD) WITH PROPOFOL
Anesthesia: General

## 2023-02-23 MED ORDER — NITROGLYCERIN 0.4 MG SL SUBL
0.4000 mg | SUBLINGUAL_TABLET | SUBLINGUAL | Status: DC | PRN
  Administered 2023-02-23 (×2): 0.4 mg via SUBLINGUAL
  Filled 2023-02-23: qty 1

## 2023-02-23 MED ORDER — SODIUM CHLORIDE 0.9 % IV SOLN: INTRAVENOUS | Status: DC

## 2023-02-23 MED ORDER — MORPHINE SULFATE (PF) 2 MG/ML IV SOLN
2.0000 mg | INTRAVENOUS | Status: AC | PRN
  Administered 2023-02-23 – 2023-02-24 (×2): 2 mg via INTRAVENOUS
  Filled 2023-02-23 (×2): qty 1

## 2023-02-23 MED ORDER — POTASSIUM PHOSPHATES 15 MMOLE/5ML IV SOLN
30.0000 mmol | Freq: Once | INTRAVENOUS | Status: AC
  Administered 2023-02-23: 30 mmol via INTRAVENOUS
  Filled 2023-02-23: qty 10

## 2023-02-23 NOTE — Progress Notes (Addendum)
[facility] Progress Note  [name]    [id number]  DOA: 02/22/2023     Date of Service: the patient was seen and examined on 02/23/2023  Chief Complaint  Patient presents with   GI Problem   Rectal Bleeding   Brief hospital course: Ethan Wells is a 52 y.o. male with medical history significant of polysubstance abuse (alcohol, tobacco, cocaine, marijuana), GI bleeding, hypertension, hyperlipidemia, CAD, STEMI with BMS placement, CHF, depression with anxiety, adrenal insufficiency, cardiac arrest due to V-fib in the setting of STEMI, who presents with GI bleeding.    Patient was recently hospitalized from 2/1 - 2/16 due to GI bleeding related hemorrhagic shock. Pt states that he started having coffee-ground hematemesis again since yesterday.  He states that it totally he has had more than 20 times of hematemesis.  He also has black tarry stool.  Patient has nausea and abdominal pain.  His abdominal pain is diffuse, constant, aching, mild to moderate, nonradiating.  No fever or chills.  He has mild shortness breath and chest discomfort, no cough.  Denies symptoms of UTI.  No confusion.  He reports possible syncope but he is uncertain about this.  No unilateral numbness or tinglings or weakness extremities.   Patient was found to be hypotensive initially, with blood pressure 74/56, which improved to 92/53 after giving 3 L LR in ED.  ED w/up: Hb 10.7 on 01/29/2023 --> 7.5, troponin 46, WBC 11.6, INR 1.6, PTT 33, temperature normal, heart rate 100-120s, RR 30 --> 14, oxygen saturation 98% on room air.   EKG: Sinus rhythm, QTc 471, LAE, early R wave progression, frequent PVC.   Patient is admitted to stepdown as inpatient.  Dr. Marius Ditch of GI is consulted.   Assessment and Plan: Principal Problem:   GI bleeding Active Problems:   Acute blood loss anemia   Adrenal insufficiency (HCC)   CAD (coronary artery disease)   Myocardial injury   Hypomagnesemia   Chronic  systolic CHF (congestive heart failure) (HCC)   HLD (hyperlipidemia)   Thrombocytopenia (HCC)   Essential hypertension   Depression with anxiety   Polysubstance abuse ([place])   Morbid obesity ([place])   # Acute GI bleeding # Acute blood loss anemia Hb 7.1--8.1 S/p 2 unit of PRBC transfusion Anemia workup within normal range Continue IV ceftriaxone for SBP prophylaxis S/p IV fluid bolus, vital signs stable Continue pantoprazole IV infusion Continue octreotide IV infusion Continue Zofran as needed Monitor H&H and transfuse if hemoglobin less than 7 GI consulted, EGD could not be done today due to urine tox positive for cocaine. Continue full liquid diet GI is planning for EGD on Tuesday if urine tox will be negative on Tuesday   # Liver cirrhosis due to alcohol abuse Continue rifaximin 550 mg p.o. twice daily for hepatic encephalopathy, need to continue long-term on discharge Continue lactulose Ammonia 44--56 Check ammonia level Follow GI, EGD to rule out esophageal varices and portal gastropathy   # EtOH abuse, withdrawal symptoms Continue CIWA protocol EtOH abstinence counseling done   # Hypokalemia, potassium repleted # Hypophosphatemia, Phos repleted. # Hypomagnesemia, mag repleted. Monitor electrolytes and replete as needed  # Adrenal insufficiency (HCC) -Give Solu-Cortef 100 mg x 2 doses as stress doses -Continue home Cortef 20 mg daily  -Continue home midodrine 10 mg 3 times daily for hypotension  CAD (coronary artery disease) and myocardial injury: Troponin 46.  History of STEMI with BMS placement -Will not give aspirin due to GI bleeding -  Crestor, Zetia -Troponin 172--101 trended down -Check [id number],  Chronic systolic CHF (congestive heart failure) [facility]): Patient had history of systolic CHF.  2D echo on 07/13/2019 showed EF of 45-50%.  His EF has improved in recent years.  2D echo on 01/17/2023 showed EF of 50-55%.  Patient does not have leg edema or JVD.  CHF is  compensated today -Hold diuretics due to hypotension -BNP 207    HLD (hyperlipidemia) -Crestor, Zetia Lipid profile LDL 62, triglyceride 53, within normal range  Thrombocytopenia ([place]): This is a chronic issue.  Platelet 103 on 11/21/2022, 158 on 01/29/2023.  Today platelet 108.  Likely due to alcohol abuse and possible liver cirrhosis. -Follow-up with CBC   Essential hypertension -Hold off blood pressure medications and diuretics due to hypotension -Hold Imdur nitroglycerin   Depression with anxiety -Seroquel   Polysubstance abuse: Including cocaine, tobacco, alcohol, marijuana -Did counseling about importance of quitting substance use -Nicotine patch -CIWA protocol -UDS positive for cocaine   Morbid obesity ([place]): Body weight while 4.2 kg, BMI 38.22 -Healthy diet and exercise -Encouraged losing weight    Body mass index is 31.04 kg/m.  Interventions:       Diet: Full liquid diet DVT Prophylaxis: SCD, pharmacological prophylaxis contraindicated due to GI bleeding    Advance goals of care discussion: Full code  Family Communication: family was NOT present at bedside, at the time of interview.  The pt provided permission to discuss medical plan with the family. Opportunity was given to ask question and all questions were answered satisfactorily.   Disposition:  Pt is from hOME, admitted with GI bleeding, still on IV PPI and octreotide, GI workup pending, which precludes a safe discharge. Discharge to Home, when stable and cleared by GI.  Subjective: No significant events overnight, patient was complaining of generalized body ache, pain in the back, left lower extremity, chest and right arm basically complaining of pain all over.  Patient had 1 dark BM today morning, no hematemesis.  Physical Exam: General: NAD, lying comfortably Appear in no distress, affect appropriate Eyes: PERRLA ENT: Oral Mucosa Clear, moist  Neck: no JVD,  Cardiovascular: S1 and S2 Present,  no Murmur,  Respiratory: good respiratory effort, Bilateral Air entry equal and Decreased, no Crackles, no wheezes Abdomen: Bowel Sound present, Soft, distended and mild tenderness,  Skin: no rashes Extremities: mild Pedal edema, no calf tenderness Neurologic: without any new focal findings Gait not checked due to patient safety concerns  Vitals:   02/23/23 1100 02/23/23 1200 02/23/23 1215 02/23/23 1351  BP: 108/65 (!) 88/66 131/68 (!) 99/54  Pulse: 92 (!) 106 (!) 103 68  Resp: (!) 25 (!) 23 (!) 22 20  Temp:   97.8 F (36.6 C) 98.4 F (36.9 C)  TempSrc:   Oral Oral  SpO2: 97% 96% 97% 98%  Weight:      Height:        Intake/Output Summary (Last 24 hours) at 02/23/2023 1447 Last data filed at 02/23/2023 1200 Gross per 24 hour  Intake 3625.57 ml  Output 100 ml  Net 3525.57 ml   Filed Weights   02/22/23 0905  Weight: 84.6 kg    Data Reviewed: I have personally reviewed and interpreted daily labs, tele strips, imagings as discussed above. I reviewed all nursing notes, pharmacy notes, vitals, pertinent old records I have discussed plan of care as described above with RN and patient/family.  CBC: Recent Labs  Lab 02/22/23 0450 02/22/23 0918 02/22/23 1116 02/22/23 1900 02/22/23 2132 02/23/23  0321 02/23/23 0713  WBC 11.6* 10.5 11.6* 9.2  --  7.5 9.8  NEUTROABS 8.4*  --   --   --   --   --   --   HGB 7.5* 8.0* 8.0* 7.2* 7.1* 7.9* 8.1*  HCT 23.7* 23.8* 24.2* 21.6* 20.9* 23.1* 23.9*  MCV 90.8 85.9 86.7 87.1  --  86.2 86.3  PLT 108* 103* 130* 90*  --  88* 87*   Basic Metabolic Panel: Recent Labs  Lab 02/22/23 0450 02/22/23 2132 02/23/23 0321  NA 139 137 139  K 3.7 3.1* 3.3*  CL 99 105 108  CO2 '27 28 25  '$ GLUCOSE 160* 132* 146*  BUN 30* 22* 20  CREATININE 1.00 0.67 0.59*  CALCIUM 8.7* 7.5* 7.6*  MG 1.6* 2.0 2.0  PHOS 2.9  --  2.1*    Studies: No results found.  Scheduled Meds:  Chlorhexidine Gluconate Cloth  6 each Topical Q0600   ezetimibe  10 mg Oral  Daily   ferrous sulfate  325 mg Oral Q breakfast   folic acid  1 mg Oral Daily   gabapentin  300 mg Oral TID   hydrocortisone  20 mg Oral Daily   lactulose  20 g Oral BID   LORazepam  0-4 mg Oral Q6H   Followed by   Derrill Memo ON 02/24/2023] LORazepam  0-4 mg Oral Q12H   midodrine  10 mg Oral TID WC   multivitamin with minerals  1 tablet Oral Daily   nicotine  21 mg Transdermal Daily   [START ON 02/25/2023] pantoprazole  40 mg Intravenous Q12H   QUEtiapine  25 mg Oral QHS   rifaximin  550 mg Oral BID   rosuvastatin  40 mg Oral Daily   thiamine  100 mg Oral Daily   Or   thiamine  100 mg Intravenous Daily   Continuous Infusions:  sodium chloride     sodium chloride 100 mL/hr at 02/23/23 1200   sodium chloride Stopped (02/23/23 1045)   cefTRIAXone (ROCEPHIN)  IV Stopped (02/23/23 0505)   lactated ringers     octreotide (SANDOSTATIN) 500 mcg in sodium chloride 0.9 % 250 mL (2 mcg/mL) infusion 50 mcg/hr (02/23/23 1350)   pantoprazole 8 mg/hr (02/23/23 1200)   potassium PHOSPHATE IVPB (in mmol) 85 mL/hr at 02/23/23 1200   PRN Meds: acetaminophen **OR** acetaminophen, methocarbamol, metoCLOPramide (REGLAN) injection, mouth rinse, oxyCODONE, traZODone  Time spent: 50 minutes  Author: Val Riles. MD [facility] 02/23/2023 2:47 PM  To reach On-call, see care teams to locate the attending and reach out to them via [email or web address]. If 7PM-7AM, please contact night-coverage If you still have difficulty reaching the attending provider, please page the [facility] (Director on Call) for [facility] on amion for assistance.

## 2023-02-23 NOTE — Progress Notes (Signed)
Positive UDS on the patient this morning.  Upper Endoscopy canceled per Dr Marius Ditch and Dr Rosey Bath.  Will notify CCU RN that is caring for the patient today.

## 2023-02-23 NOTE — Progress Notes (Signed)
Ethan Darby, MD [address]  [place]  [place], [place] [address]  Main: [phone]  Fax: [phone] Pager: [phone]   Subjective: No acute events overnight.  Patient had 1 brown bowel movement today.  EGD could not be performed today because his urine toxicology screen came back positive for cocaine.  Patient reports mild abdominal discomfort only.  Able to tolerate liquid diet this morning.  Denies any nausea or vomiting or hematemesis or coffee-ground emesis.   Objective: Vital signs in last 24 hours: Vitals:   02/23/23 0800 02/23/23 0900 02/23/23 1000 02/23/23 1100  BP: (!) 99/57 (!) 101/59 (!) 129/54 108/65  Pulse: 81 85 98 92  Resp: '18 20 18 '$ (!) 25  Temp:      TempSrc:      SpO2: 95% 95% 95% 97%  Weight:      Height:       Weight change:   Intake/Output Summary (Last 24 hours) at 02/23/2023 1140 Last data filed at 02/23/2023 1028 Gross per 24 hour  Intake 4904.27 ml  Output 100 ml  Net 4804.27 ml     Exam: Heart:: Regular rate and rhythm, S1S2 present, or without murmur or extra heart sounds Lungs: normal and clear to auscultation Abdomen: soft, nontender, normal bowel sounds   Lab Results: '@LABTEST2'$ @ Micro Results: Recent Results (from the past 240 hour(s))  MRSA Next Gen by PCR, Nasal     Status: None   Collection Time: 02/22/23  9:09 AM   Specimen: Nasal Mucosa; Nasal Swab  Result Value Ref Range Status   MRSA by PCR Next Gen NOT DETECTED NOT DETECTED Final    Comment: (NOTE) The GeneXpert MRSA Assay (FDA approved for NASAL specimens only), is one component of a comprehensive MRSA colonization surveillance program. It is not intended to diagnose MRSA infection nor to guide or monitor treatment for MRSA infections. Test performance is not FDA approved in patients less than 55 years old. Performed at [facility], [address] [place], [place] [address]    Studies/Results: No results found. Medications: I have  reviewed the patient's current medications. Prior to Admission:  Medications Prior to Admission  Medication Sig Dispense Refill Last Dose   ezetimibe (ZETIA) 10 MG tablet Take 10 mg by mouth daily.   Past Week   ferrous sulfate 325 (65 FE) MG tablet Take 1 tablet (325 mg total) by mouth daily with breakfast. 30 tablet 1 Past Week   folic acid (FOLVITE) 1 MG tablet Take 1 tablet (1 mg total) by mouth daily. 30 tablet 0 Past Week   furosemide (LASIX) 40 MG tablet Take 40 mg by mouth daily.   Past Week   gabapentin (NEURONTIN) 300 MG capsule Take 1 capsule (300 mg total) by mouth 3 (three) times daily. 90 capsule 4 Past Week   hydrocortisone (CORTEF) 20 MG tablet Take 1 tablet (20 mg total) by mouth daily. 30 tablet 0 Past Week   isosorbide mononitrate (IMDUR) 30 MG 24 hr tablet Take 15 mg by mouth daily.   Past Week   midodrine (PROAMATINE) 10 MG tablet Take 1 tablet (10 mg total) by mouth 3 (three) times daily with meals. 20 tablet 0 Past Week   pantoprazole (PROTONIX) 40 MG tablet TAKE 1 TABLET BY MOUTH EVERY DAY (Patient taking differently: Take 40 mg by mouth 2 (two) times daily.) 90 tablet 1 Past Week   QUEtiapine (SEROQUEL) 25 MG tablet Take 1 tablet (25 mg total) by mouth at bedtime.  30 tablet 0 Past Week   rosuvastatin (CRESTOR) 40 MG tablet TAKE 1 TABLET BY MOUTH EVERY DAY 90 tablet 1 Past Week   thiamine (VITAMIN B-1) 100 MG tablet Take 1 tablet (100 mg total) by mouth daily. 30 tablet 0 Past Week   torsemide (DEMADEX) 20 MG tablet Take 1 tablet (20 mg total) by mouth daily. 30 tablet 0 Past Week   acetaminophen (TYLENOL) 500 MG tablet Take 2 tablets (1,000 mg total) by mouth every 6 (six) hours as needed for mild pain. (Patient not taking: Reported on 01/17/2023)   Not Taking   hydrocortisone (CORTEF) 10 MG tablet Take 1 tablet (10 mg total) by mouth daily. (Patient not taking: Reported on 02/22/2023) 30 tablet 0 Not Taking   methocarbamol (ROBAXIN) 750 MG tablet TAKE 1 TABLET (750 MG TOTAL)  BY MOUTH DAILY AS NEEDED FOR MUSCLE SPASMS 30 tablet 0 prn at prn   nitroGLYCERIN (NITROSTAT) 0.4 MG SL tablet Place 1 tablet (0.4 mg total) under the tongue every 5 (five) minutes as needed for chest pain. 30 tablet 12 prn at prn   oxyCODONE 10 MG TABS Take 1 tablet (10 mg total) by mouth every 8 (eight) hours as needed for severe pain. 10 tablet 0 prn at prn   Scheduled:  Chlorhexidine Gluconate Cloth  6 each Topical Q0600   ezetimibe  10 mg Oral Daily   ferrous sulfate  325 mg Oral Q breakfast   folic acid  1 mg Oral Daily   gabapentin  300 mg Oral TID   hydrocortisone  20 mg Oral Daily   lactulose  20 g Oral BID   LORazepam  0-4 mg Oral Q6H   Followed by   Derrill Memo ON 02/24/2023] LORazepam  0-4 mg Oral Q12H   midodrine  10 mg Oral TID WC   multivitamin with minerals  1 tablet Oral Daily   nicotine  21 mg Transdermal Daily   [START ON 02/25/2023] pantoprazole  40 mg Intravenous Q12H   QUEtiapine  25 mg Oral QHS   rifaximin  550 mg Oral BID   rosuvastatin  40 mg Oral Daily   thiamine  100 mg Oral Daily   Or   thiamine  100 mg Intravenous Daily   Continuous:  sodium chloride     sodium chloride 100 mL/hr at 02/23/23 1000   sodium chloride 20 mL/hr at 02/23/23 1000   cefTRIAXone (ROCEPHIN)  IV Stopped (02/23/23 0505)   lactated ringers     octreotide (SANDOSTATIN) 500 mcg in sodium chloride 0.9 % 250 mL (2 mcg/mL) infusion 50 mcg/hr (02/23/23 1000)   pantoprazole 8 mg/hr (02/23/23 1042)   potassium PHOSPHATE IVPB (in mmol) 30 mmol (02/23/23 1047)   [id number] **OR** acetaminophen, methocarbamol, metoCLOPramide (REGLAN) injection, mouth rinse, oxyCODONE, traZODone Anti-infectives (From admission, onward)    Start     Dose/Rate Route Frequency Ordered Stop   02/23/23 0400  cefTRIAXone (ROCEPHIN) 2 g in sodium chloride 0.9 % 100 mL IVPB        2 g 200 mL/hr over 30 Minutes Intravenous Every 24 hours 02/22/23 0749     02/22/23 1330  rifaximin (XIFAXAN) tablet 550 mg         550 mg Oral 2 times daily 02/22/23 1241     02/22/23 0430  cefTRIAXone (ROCEPHIN) 2 g in sodium chloride 0.9 % 100 mL IVPB        2 g 200 mL/hr over 30 Minutes Intravenous  Once 02/22/23 0429 02/22/23 0515  Scheduled Meds:  Chlorhexidine Gluconate Cloth  6 each Topical Q0600   ezetimibe  10 mg Oral Daily   ferrous sulfate  325 mg Oral Q breakfast   folic acid  1 mg Oral Daily   gabapentin  300 mg Oral TID   hydrocortisone  20 mg Oral Daily   lactulose  20 g Oral BID   LORazepam  0-4 mg Oral Q6H   Followed by   Derrill Memo ON 02/24/2023] LORazepam  0-4 mg Oral Q12H   midodrine  10 mg Oral TID WC   multivitamin with minerals  1 tablet Oral Daily   nicotine  21 mg Transdermal Daily   [START ON 02/25/2023] pantoprazole  40 mg Intravenous Q12H   QUEtiapine  25 mg Oral QHS   rifaximin  550 mg Oral BID   rosuvastatin  40 mg Oral Daily   thiamine  100 mg Oral Daily   Or   thiamine  100 mg Intravenous Daily   Continuous Infusions:  sodium chloride     sodium chloride 100 mL/hr at 02/23/23 1000   sodium chloride 20 mL/hr at 02/23/23 1000   cefTRIAXone (ROCEPHIN)  IV Stopped (02/23/23 0505)   lactated ringers     octreotide (SANDOSTATIN) 500 mcg in sodium chloride 0.9 % 250 mL (2 mcg/mL) infusion 50 mcg/hr (02/23/23 1000)   pantoprazole 8 mg/hr (02/23/23 1042)   potassium PHOSPHATE IVPB (in mmol) 30 mmol (02/23/23 1047)   PRN Meds:.acetaminophen **OR** acetaminophen, methocarbamol, metoCLOPramide (REGLAN) injection, mouth rinse, oxyCODONE, traZODone   Assessment: Principal Problem:   GI bleeding Active Problems:   Essential hypertension   CAD (coronary artery disease)   Polysubstance abuse (HCC)   Hypomagnesemia   Chronic systolic CHF (congestive heart failure) ([place])   Depression with anxiety   Acute blood loss anemia   Morbid obesity (HCC)   Myocardial injury   HLD (hyperlipidemia)   Thrombocytopenia (HCC)   Adrenal insufficiency ([place])   Alcoholic liver disease ([place])    Upper GI bleed  Ethan Wells is a 52 y.o. male with alcohol abuse, possible cirrhosis of liver, hypertension, hyperlipidemia, history of coronary artery disease, STEMI and V-fib arrest, history of erosive esophagitis, history of hemorrhoidectomy is admitted with hematemesis and melena with acute blood loss anemia with ongoing alcohol use and polysubstance abuse, cocaine positive during this admission  Plan: Hematemesis and melena with acute blood loss anemia: Symptoms have currently resolved since admission Hemoglobin is stable within last 24 hours Continue pantoprazole drip, octreotide drip Continue ceftriaxone for SBP prophylaxis Okay to start full liquid diet Monitor CBC closely to maintain hemoglobin above 8 Maintain platelets above 50 Could not perform EGD today due to cocaine positive on U tox and patient not actively bleeding.  Will plan to perform EGD on Tuesday if urine toxicology screen comes back negative on Tuesday   ?  Cirrhosis of liver, alcohol dependence EGD as above to rule out any esophageal varices Continue rifaximin 550 mg twice daily for hepatic encephalopathy, continue long-term upon discharge No indication for prednisone for acute alcoholic hepatitis given low discriminant function Does not appear volume overloaded, hold diuretics No evidence of portal vein thrombosis based on CT angio on 01/17/23 Patient will need long-term follow-up for management of chronic alcoholic liver disease Will benefit from inpatient alcohol rehab program     LOS: 1 day   Willey Due 02/23/2023, 11:40 AM

## 2023-02-24 DIAGNOSIS — K922 Gastrointestinal hemorrhage, unspecified: Secondary | ICD-10-CM | POA: Diagnosis not present

## 2023-02-24 DIAGNOSIS — D62 Acute posthemorrhagic anemia: Secondary | ICD-10-CM | POA: Diagnosis not present

## 2023-02-24 LAB — CBC
HCT: 22.6 % — ABNORMAL LOW (ref 39.0–52.0)
Hemoglobin: 7.4 g/dL — ABNORMAL LOW (ref 13.0–17.0)
MCH: 29.7 pg (ref 26.0–34.0)
MCHC: 32.7 g/dL (ref 30.0–36.0)
MCV: 90.8 fL (ref 80.0–100.0)
Platelets: 68 10*3/uL — ABNORMAL LOW (ref 150–400)
RBC: 2.49 MIL/uL — ABNORMAL LOW (ref 4.22–5.81)
RDW: 21 % — ABNORMAL HIGH (ref 11.5–15.5)
WBC: 5.4 10*3/uL (ref 4.0–10.5)
nRBC: 0 % (ref 0.0–0.2)

## 2023-02-24 LAB — BASIC METABOLIC PANEL
Anion gap: 4 — ABNORMAL LOW (ref 5–15)
BUN: 14 mg/dL (ref 6–20)
CO2: 21 mmol/L — ABNORMAL LOW (ref 22–32)
Calcium: 7.2 mg/dL — ABNORMAL LOW (ref 8.9–10.3)
Chloride: 112 mmol/L — ABNORMAL HIGH (ref 98–111)
Creatinine, Ser: 0.56 mg/dL — ABNORMAL LOW (ref 0.61–1.24)
GFR, Estimated: >60 mL/min (ref >60)
Glucose, Bld: 113 mg/dL — ABNORMAL HIGH (ref 70–99)
Potassium: 3.3 mmol/L — ABNORMAL LOW (ref 3.5–5.1)
Sodium: 137 mmol/L (ref 135–145)

## 2023-02-24 LAB — HEMOGLOBIN AND HEMATOCRIT, BLOOD
HCT: 23.9 % — ABNORMAL LOW (ref 39.0–52.0)
Hemoglobin: 7.4 g/dL — ABNORMAL LOW (ref 13.0–17.0)

## 2023-02-24 LAB — MAGNESIUM: Magnesium: 2 mg/dL (ref 1.7–2.4)

## 2023-02-24 LAB — AMMONIA: Ammonia: 47 umol/L — ABNORMAL HIGH (ref 9–35)

## 2023-02-24 LAB — HEMOGLOBIN A1C
Hgb A1c MFr Bld: 5.6 % (ref 4.8–5.6)
Mean Plasma Glucose: 114 mg/dL

## 2023-02-24 LAB — PHOSPHORUS: Phosphorus: 2 mg/dL — ABNORMAL LOW (ref 2.5–4.6)

## 2023-02-24 LAB — TROPONIN I (HIGH SENSITIVITY): Troponin I (High Sensitivity): 143 ng/L (ref <18)

## 2023-02-24 MED ORDER — POTASSIUM PHOSPHATES 15 MMOLE/5ML IV SOLN
30.0000 mmol | Freq: Once | INTRAVENOUS | Status: AC
  Administered 2023-02-24: 30 mmol via INTRAVENOUS
  Filled 2023-02-24: qty 10

## 2023-02-24 NOTE — Progress Notes (Signed)
Ethan Darby, MD [address]  [place]  [place], [place] [address]  Main: [phone]  Fax: [phone] Pager: [phone]   Subjective: No acute events overnight.  Patient is moved to the floor.  Sleeping, comfortable, reports mild abdominal discomfort only.  Able to tolerate liquid diet this morning.  Denies any nausea or vomiting or hematemesis or coffee-ground emesis.   Objective: Vital signs in last 24 hours: Vitals:   02/23/23 1802 02/23/23 2006 02/24/23 0312 02/24/23 0849  BP: 106/64 111/70 120/66 (!) 120/98  Pulse: 88 87 82 77  Resp: '16 18 16 17  '$ Temp: 97.7 F (36.5 C) (!) 97.5 F (36.4 C) 98 F (36.7 C) 98.3 F (36.8 C)  TempSrc: Oral Oral  Oral  SpO2: 100% 100% 99% 96%  Weight:      Height:       Weight change:   Intake/Output Summary (Last 24 hours) at 02/24/2023 1648 Last data filed at 02/24/2023 1614 Gross per 24 hour  Intake 3559.63 ml  Output 1200 ml  Net 2359.63 ml     Exam: Heart:: Regular rate and rhythm, S1S2 present, or without murmur or extra heart sounds Lungs: normal and clear to auscultation Abdomen: soft, nontender, normal bowel sounds   Lab Results:    Latest Ref Rng & Units 02/24/2023    1:32 AM 02/23/2023    5:12 PM 02/23/2023    7:13 AM  CBC  WBC 4.0 - 10.5 K/uL 5.4   9.8   Hemoglobin 13.0 - 17.0 g/dL 7.4  9.0  8.1   Hematocrit 39.0 - 52.0 % 22.6  26.6  23.9   Platelets 150 - 400 K/uL 68   87       Latest Ref Rng & Units 02/24/2023    1:32 AM 02/23/2023    3:21 AM 02/22/2023    9:32 PM  CMP  Glucose 70 - 99 mg/dL 113  146  132   BUN 6 - 20 mg/dL '14  20  22   '$ Creatinine 0.61 - 1.24 mg/dL 0.56  0.59  0.67   Sodium 135 - 145 mmol/L 137  139  137   Potassium 3.5 - 5.1 mmol/L 3.3  3.3  3.1   Chloride 98 - 111 mmol/L 112  108  105   CO2 22 - 32 mmol/L '21  25  28   '$ Calcium 8.9 - 10.3 mg/dL 7.2  7.6  7.5   Total Protein 6.5 - 8.1 g/dL   4.7   Total Bilirubin 0.3 - 1.2 mg/dL   2.4   Alkaline Phos 38 - 126 U/L   91    AST 15 - 41 U/L   37   ALT 0 - 44 U/L   13     Micro Results: Recent Results (from the past 240 hour(s))  MRSA Next Gen by PCR, Nasal     Status: None   Collection Time: 02/22/23  9:09 AM   Specimen: Nasal Mucosa; Nasal Swab  Result Value Ref Range Status   MRSA by PCR Next Gen NOT DETECTED NOT DETECTED Final    Comment: (NOTE) The GeneXpert MRSA Assay (FDA approved for NASAL specimens only), is one component of a comprehensive MRSA colonization surveillance program. It is not intended to diagnose MRSA infection nor to guide or monitor treatment for MRSA infections. Test performance is not FDA approved in patients less than 76 years old. Performed at [facility], [address] [place], [place] [address]  Studies/Results: No results found. Medications: I have reviewed the patient's current medications. Prior to Admission:  Medications Prior to Admission  Medication Sig Dispense Refill Last Dose   ezetimibe (ZETIA) 10 MG tablet Take 10 mg by mouth daily.   Past Week   ferrous sulfate 325 (65 FE) MG tablet Take 1 tablet (325 mg total) by mouth daily with breakfast. 30 tablet 1 Past Week   folic acid (FOLVITE) 1 MG tablet Take 1 tablet (1 mg total) by mouth daily. 30 tablet 0 Past Week   furosemide (LASIX) 40 MG tablet Take 40 mg by mouth daily.   Past Week   gabapentin (NEURONTIN) 300 MG capsule Take 1 capsule (300 mg total) by mouth 3 (three) times daily. 90 capsule 4 Past Week   hydrocortisone (CORTEF) 20 MG tablet Take 1 tablet (20 mg total) by mouth daily. 30 tablet 0 Past Week   isosorbide mononitrate (IMDUR) 30 MG 24 hr tablet Take 15 mg by mouth daily.   Past Week   midodrine (PROAMATINE) 10 MG tablet Take 1 tablet (10 mg total) by mouth 3 (three) times daily with meals. 20 tablet 0 Past Week   pantoprazole (PROTONIX) 40 MG tablet TAKE 1 TABLET BY MOUTH EVERY DAY (Patient taking differently: Take 40 mg by mouth 2 (two) times daily.) 90 tablet 1 Past Week    QUEtiapine (SEROQUEL) 25 MG tablet Take 1 tablet (25 mg total) by mouth at bedtime. 30 tablet 0 Past Week   rosuvastatin (CRESTOR) 40 MG tablet TAKE 1 TABLET BY MOUTH EVERY DAY 90 tablet 1 Past Week   thiamine (VITAMIN B-1) 100 MG tablet Take 1 tablet (100 mg total) by mouth daily. 30 tablet 0 Past Week   torsemide (DEMADEX) 20 MG tablet Take 1 tablet (20 mg total) by mouth daily. 30 tablet 0 Past Week   acetaminophen (TYLENOL) 500 MG tablet Take 2 tablets (1,000 mg total) by mouth every 6 (six) hours as needed for mild pain. (Patient not taking: Reported on 01/17/2023)   Not Taking   hydrocortisone (CORTEF) 10 MG tablet Take 1 tablet (10 mg total) by mouth daily. (Patient not taking: Reported on 02/22/2023) 30 tablet 0 Not Taking   methocarbamol (ROBAXIN) 750 MG tablet TAKE 1 TABLET (750 MG TOTAL) BY MOUTH DAILY AS NEEDED FOR MUSCLE SPASMS 30 tablet 0 prn at prn   nitroGLYCERIN (NITROSTAT) 0.4 MG SL tablet Place 1 tablet (0.4 mg total) under the tongue every 5 (five) minutes as needed for chest pain. 30 tablet 12 prn at prn   oxyCODONE 10 MG TABS Take 1 tablet (10 mg total) by mouth every 8 (eight) hours as needed for severe pain. 10 tablet 0 prn at prn   Scheduled:  ezetimibe  10 mg Oral Daily   ferrous sulfate  325 mg Oral Q breakfast   folic acid  1 mg Oral Daily   gabapentin  300 mg Oral TID   hydrocortisone  20 mg Oral Daily   lactulose  20 g Oral BID   LORazepam  0-4 mg Oral Q12H   midodrine  10 mg Oral TID WC   multivitamin with minerals  1 tablet Oral Daily   nicotine  21 mg Transdermal Daily   [START ON 02/25/2023] pantoprazole  40 mg Intravenous Q12H   QUEtiapine  25 mg Oral QHS   rifaximin  550 mg Oral BID   rosuvastatin  40 mg Oral Daily   thiamine  100 mg Oral Daily   Or  thiamine  100 mg Intravenous Daily   Continuous:  sodium chloride Stopped (02/23/23 1806)   sodium chloride Stopped (02/24/23 0516)   cefTRIAXone (ROCEPHIN)  IV Stopped (02/24/23 0346)   lactated ringers      octreotide (SANDOSTATIN) 500 mcg in sodium chloride 0.9 % 250 mL (2 mcg/mL) infusion 50 mcg/hr (02/24/23 1614)   pantoprazole 8 mg/hr (02/24/23 1614)   [id number] **OR** acetaminophen, methocarbamol, metoCLOPramide (REGLAN) injection, nitroGLYCERIN, mouth rinse, oxyCODONE, traZODone Anti-infectives (From admission, onward)    Start     Dose/Rate Route Frequency Ordered Stop   02/23/23 0400  cefTRIAXone (ROCEPHIN) 2 g in sodium chloride 0.9 % 100 mL IVPB        2 g 200 mL/hr over 30 Minutes Intravenous Every 24 hours 02/22/23 0749     02/22/23 1330  rifaximin (XIFAXAN) tablet 550 mg        550 mg Oral 2 times daily 02/22/23 1241     02/22/23 0430  cefTRIAXone (ROCEPHIN) 2 g in sodium chloride 0.9 % 100 mL IVPB        2 g 200 mL/hr over 30 Minutes Intravenous  Once 02/22/23 0429 02/22/23 0515      Scheduled Meds:  ezetimibe  10 mg Oral Daily   ferrous sulfate  325 mg Oral Q breakfast   folic acid  1 mg Oral Daily   gabapentin  300 mg Oral TID   hydrocortisone  20 mg Oral Daily   lactulose  20 g Oral BID   LORazepam  0-4 mg Oral Q12H   midodrine  10 mg Oral TID WC   multivitamin with minerals  1 tablet Oral Daily   nicotine  21 mg Transdermal Daily   [START ON 02/25/2023] pantoprazole  40 mg Intravenous Q12H   QUEtiapine  25 mg Oral QHS   rifaximin  550 mg Oral BID   rosuvastatin  40 mg Oral Daily   thiamine  100 mg Oral Daily   Or   thiamine  100 mg Intravenous Daily   Continuous Infusions:  sodium chloride Stopped (02/23/23 1806)   sodium chloride Stopped (02/24/23 0516)   cefTRIAXone (ROCEPHIN)  IV Stopped (02/24/23 0346)   lactated ringers     octreotide (SANDOSTATIN) 500 mcg in sodium chloride 0.9 % 250 mL (2 mcg/mL) infusion 50 mcg/hr (02/24/23 1614)   pantoprazole 8 mg/hr (02/24/23 1614)   PRN Meds:.acetaminophen **OR** acetaminophen, methocarbamol, metoCLOPramide (REGLAN) injection, nitroGLYCERIN, mouth rinse, oxyCODONE, traZODone   Assessment: Principal  Problem:   GI bleeding Active Problems:   Essential hypertension   CAD (coronary artery disease)   Polysubstance abuse (HCC)   Hypomagnesemia   Chronic systolic CHF (congestive heart failure) ([place])   Depression with anxiety   Acute blood loss anemia   Morbid obesity (HCC)   Myocardial injury   HLD (hyperlipidemia)   Thrombocytopenia (HCC)   Adrenal insufficiency ([place])   Alcoholic liver disease ([place])   Upper GI bleed  Ethan Wells is a 52 y.o. male with alcohol abuse, possible cirrhosis of liver, hypertension, hyperlipidemia, history of coronary artery disease, STEMI and V-fib arrest, history of erosive esophagitis, history of hemorrhoidectomy is admitted with hematemesis and melena with acute blood loss anemia with ongoing alcohol use and polysubstance abuse, cocaine positive during this admission  Plan: Hematemesis and melena with acute blood loss anemia: Symptoms have currently resolved since admission Hemoglobin is stable within last 24 hours Continue pantoprazole drip, octreotide drip Continue ceftriaxone for SBP prophylaxis Okay to start full liquid diet Monitor  CBC closely to maintain hemoglobin above 8 Maintain platelets above 50 Could not perform EGD earlier due to cocaine positive on U tox and patient not actively bleeding.  Will plan to perform EGD on Tuesday if urine toxicology screen comes back negative on TuesdayAM Urine toxicology screen ordered for tomorrow a.m.   ?  Cirrhosis of liver, alcohol dependence EGD as above to rule out any esophageal varices Continue rifaximin 550 mg twice daily for hepatic encephalopathy, continue long-term upon discharge No indication for prednisone for acute alcoholic hepatitis given low discriminant function Does not appear volume overloaded, hold diuretics No evidence of portal vein thrombosis based on CT angio on 01/17/23 Patient will need long-term follow-up for management of chronic alcoholic liver disease Will benefit from  inpatient alcohol rehab program     LOS: 2 days   Ethan Wells 02/24/2023, 4:48 PM

## 2023-02-24 NOTE — Discharge Instructions (Signed)
                  Intensive Outpatient Programs  [facility]    [facility] [address]     [address] [place],  [place]     [place], [place] [phone]      [phone]  [facility]   [facility]  (Inpatient and outpatient)  [phone] (Suboxone and Methadone) [address]           [phone]           [facility]: [facility]    [facility] - Intensive Outpatient [address]     [address] [place], [place] [address]     [place], [place]  [phone]      [phone]  [facility] (Outpatient, Inpatient, [facility]  [facility] (Groups and Residental) (insurance only) [phone]    [place], [place]          [phone]       [facility]    [facility] (for caregivers and family) [address]     [address] [place], [place]     [place], [place] [phone]      [phone]  Residential Treatment Programs  [facility]  [facility](2 years) Residential: 90 days)  [facility] [address]      [address] [place], [place]     [place], [place] [phone]      [phone] or [phone]  [facility]    [facility] [address]      [address] [place], [place]     [place], [place] [phone]      [phone]  [facility]   [facility] ([facility] [address]     [address] [place], [place] [address]     [place], [place] [phone]      [phone] Admissions: 8am-3pm M-F  [facility]: Residential Program (90 Days)              [facility]: [facility]  [place], [place]     [place], [place]  [phone] or [phone]    (Walk in Hours over the weekend or by referral)   [facility]: [facility]:[phone] (for crisis  response 24 hours a day) 

## 2023-02-24 NOTE — TOC Initial Note (Signed)
Transition of Care [facility]) - Initial/Assessment Note    Patient Details  Name: Ethan Wells MRN: [id number] Date of Birth: 12-25-1970  Transition of [facility]) CM/SW Contact:    Beverly Sessions, RN Phone Number: 02/24/2023, 3:13 PM  Clinical Narrative:                  Patient was assessed by this [facility] 01/22/23  Admitted for: GI bleed Admitted from: [place] PCP: Louretta Parma - patient states he has his own transportation Current home health/prior home health/DME: Received [facility] previous admission  Substance abuse resources added to AVS         Patient Goals and CMS Choice            Expected Discharge Plan and Services                                              Prior Living Arrangements/Services                       Activities of Daily Living Home Assistive Devices/Equipment: None ADL Screening (condition at time of admission) Patient's cognitive ability adequate to safely complete daily activities?: Yes Is the patient deaf or have difficulty hearing?: No Does the patient have difficulty seeing, even when wearing glasses/contacts?: No Does the patient have difficulty concentrating, remembering, or making decisions?: No Patient able to express need for assistance with ADLs?: Yes Does the patient have difficulty dressing or bathing?: No Independently performs ADLs?: Yes (appropriate for developmental age) Does the patient have difficulty walking or climbing stairs?: No Weakness of Legs: None Weakness of Arms/Hands: None  Permission Sought/Granted                  Emotional Assessment              Admission diagnosis:  GI bleeding [K92.2] Upper GI bleed [K92.2] Patient Active Problem List   Diagnosis Date Noted   GI bleeding 02/22/2023   HLD (hyperlipidemia) 02/22/2023   Thrombocytopenia ([place]) 02/22/2023   Adrenal insufficiency ([place]) [id number]   Alcoholic liver disease ([place]) 02/22/2023   Upper GI bleed 02/22/2023    Myocardial injury 01/29/2023   Adrenal insufficiency (Addison's disease) ([place]) 01/28/2023   Reactive thrombocytosis 01/24/2023   Hypophosphatemia 01/22/2023   Acute respiratory failure with hypoxia ([place]) 01/20/2023   Orthostatic hypotension 01/17/2023   Anasarca 01/17/2023   Morbid obesity ([place]) 01/17/2023   Cocaine dependence with cocaine-induced disorder ([place]) 01/17/2023   Rectal bleeding 01/17/2023   Peripheral edema 01/17/2023   History of benzodiazepine use 01/17/2023   Chest pain 11/19/2022   Acute on chronic heart failure with preserved ejection fraction (HFpEF) ([place]) 10/29/2022   Acute respiratory distress 10/29/2022   Elevated rheumatoid factor 10/28/2022   Hyperkalemia 10/25/2022   Hemorrhoids, internal, with bleeding 10/25/2022   Mesenteric artery stenosis ([place]) 10/23/2022   Iron deficiency anemia 10/23/2022   Hematochezia    Abnormal CT scan, colon    Colitis 09/30/2022   Prolonged QT interval 09/30/2022   Alcohol use disorder 08/19/2022   GI bleed 05/28/2022   Non-intractable vomiting    Epigastric pain    Polyp of ascending colon    Acute blood loss anemia 08/07/2021   Acute gastritis    Hyperbilirubinemia    Elevated troponin    Notalgia    History of ST elevation  myocardial infarction (STEMI) 12/19/2020   Alcohol dependence ([place]) 12/19/2020   Depression with anxiety 10/05/2020   Esophageal reflux 10/05/2020   Headache 10/05/2020   Hyperlipidemia 10/05/2020   Hyponatremia 06/04/2020   Hypomagnesemia [id number]   Chronic systolic CHF (congestive heart failure) ([place]) 06/04/2020   Leukocytosis 06/04/2020   Abdominal pain 06/04/2020   Hypokalemia 06/04/2020   Ischemic cardiomyopathy    Polysubstance abuse ([place])    NSTEMI (non-ST elevated myocardial infarction) ([place]) 07/12/2019   Abdominal pain, RUQ 05/25/2018   Chest pain, mid sternal 11/17/2012   Cocaine abuse ([place])    Marijuana abuse    Tobacco abuse 02/26/2011   Essential hypertension 02/26/2011    CAD (coronary artery disease) 02/26/2011   Other specified forms of chronic ischemic heart disease 02/26/2011   PCP:  Nickola Major, NP Pharmacy:   [id number] [place], [place] [address] [place]Phone: [phone]: [phone] [facility] [address]: [phone]: [phone]    Social Determinants of Health (SDOH) Social History: SDOH Screenings   Food Insecurity: No Food Insecurity (02/22/2023)  Housing: Low Risk  (02/22/2023)  Transportation Needs: No Transportation Needs (02/22/2023)  Utilities: Not At Risk (02/22/2023)  Alcohol Screen: Medium Risk (10/01/2022)  Tobacco Use: High Risk (02/22/2023)   SDOH Interventions:     Readmission Risk Interventions    02/24/2023    3:12 PM 01/17/2023    3:38 PM 10/22/2022   10:47 AM  Readmission Risk Prevention Plan  Transportation Screening Complete Complete Complete  PCP or Specialist Appt within 3-5 Days   Complete  Social Work Consult for [place]/Counseling   Complete  Palliative Care Screening   Not Applicable  Medication Review (Press photographer Complete Complete Complete  SW Recovery Care/Counseling Consult  Not Complete   SW Consult Not Complete Comments  lethargic   Palliative Care Screening Not Applicable Not [place] Hills[place] Applicable Not Applicable

## 2023-02-24 NOTE — Progress Notes (Signed)
[facility] Progress Note  [name]    [id number]  DOA: 02/22/2023     Date of Service: the patient was seen and examined on 02/24/2023  Chief Complaint  Patient presents with   GI Problem   Rectal Bleeding   Brief hospital course: Ethan Wells is a 52 y.o. male with medical history significant of polysubstance abuse (alcohol, tobacco, cocaine, marijuana), GI bleeding, hypertension, hyperlipidemia, CAD, STEMI with BMS placement, CHF, depression with anxiety, adrenal insufficiency, cardiac arrest due to V-fib in the setting of STEMI, who presents with GI bleeding.    Patient was recently hospitalized from 2/1 - 2/16 due to GI bleeding related hemorrhagic shock. Pt states that he started having coffee-ground hematemesis again since yesterday.  He states that it totally he has had more than 20 times of hematemesis.  He also has black tarry stool.  Patient has nausea and abdominal pain.  His abdominal pain is diffuse, constant, aching, mild to moderate, nonradiating.  No fever or chills.  He has mild shortness breath and chest discomfort, no cough.  Denies symptoms of UTI.  No confusion.  He reports possible syncope but he is uncertain about this.  No unilateral numbness or tinglings or weakness extremities.   Patient was found to be hypotensive initially, with blood pressure 74/56, which improved to 92/53 after giving 3 L LR in ED.  ED w/up: Hb 10.7 on 01/29/2023 --> 7.5, troponin 46, WBC 11.6, INR 1.6, PTT 33, temperature normal, heart rate 100-120s, RR 30 --> 14, oxygen saturation 98% on room air.   EKG: Sinus rhythm, QTc 471, LAE, early R wave progression, frequent PVC.   Patient is admitted to stepdown as inpatient.  Dr. Marius Ditch of GI is consulted.   Assessment and Plan: Principal Problem:   GI bleeding Active Problems:   Acute blood loss anemia   Adrenal insufficiency (HCC)   CAD (coronary artery disease)   Myocardial injury   Hypomagnesemia   Chronic  systolic CHF (congestive heart failure) (HCC)   HLD (hyperlipidemia)   Thrombocytopenia (HCC)   Essential hypertension   Depression with anxiety   Polysubstance abuse ([place])   Morbid obesity ([place])   # Acute GI bleeding # Acute blood loss anemia Hb 7.1--8.1--7.4 S/p 2 unit of PRBC transfusion Anemia workup within normal range Continue IV ceftriaxone for SBP prophylaxis S/p IV fluid bolus, vital signs stable Continue pantoprazole IV infusion Continue octreotide IV infusion Continue Zofran as needed Monitor H&H and transfuse if hemoglobin less than 7 GI consulted, EGD could not be done today due to urine tox positive for cocaine. Continue full liquid diet GI is planning for EGD on Tuesday if urine tox will be negative on Tuesday   # Liver cirrhosis due to alcohol abuse Continue rifaximin 550 mg p.o. twice daily for hepatic encephalopathy, need to continue long-term on discharge Continue lactulose Ammonia 44--56--47 Check ammonia level Follow GI, EGD to rule out esophageal varices and portal gastropathy   # EtOH abuse, withdrawal symptoms Continue CIWA protocol EtOH abstinence counseling done   # Hypokalemia, potassium repleted # Hypophosphatemia, Phos repleted. # Hypomagnesemia, mag repleted. Monitor electrolytes and replete as needed  # Adrenal insufficiency (HCC) -Give Solu-Cortef 100 mg x 2 doses as stress doses -Continue home Cortef 20 mg daily  -Continue home midodrine 10 mg 3 times daily for hypotension  CAD (coronary artery disease) and myocardial injury: Troponin 46.  History of STEMI with BMS placement -Will not give aspirin due to GI bleeding -  Crestor, Zetia -Troponin 172--101 trended down -Check [id number],  Chronic systolic CHF (congestive heart failure) [facility]): Patient had history of systolic CHF.  2D echo on 07/13/2019 showed EF of 45-50%.  His EF has improved in recent years.  2D echo on 01/17/2023 showed EF of 50-55%.  Patient does not have leg edema or JVD.   CHF is compensatd -Hold diuretics due to hypotension -BNP 207    HLD (hyperlipidemia) -Crestor, Zetia Lipid profile LDL 62, triglyceride 53, within normal range  Thrombocytopenia ([place]): This is a chronic issue.  Platelet 103 on 11/21/2022, 158 on 01/29/2023.  Today platelet 108.  Likely due to alcohol abuse and possible liver cirrhosis. -Follow-up with CBC   Essential hypertension -Hold off blood pressure medications and diuretics due to hypotension -Hold Imdur nitroglycerin   Depression with anxiety -Seroquel   Polysubstance abuse: Including cocaine, tobacco, alcohol, marijuana -Did counseling about importance of quitting substance use -Nicotine patch -CIWA protocol -UDS positive for cocaine   Morbid obesity ([place]): Body weight while 4.2 kg, BMI 38.22 -Healthy diet and exercise -Encouraged losing weight     Body mass index is 31.04 kg/m.  Interventions:       Diet: Full liquid diet DVT Prophylaxis: SCD, pharmacological prophylaxis contraindicated due to GI bleeding    Advance goals of care discussion: Full code  Family Communication: family was NOT present at bedside, at the time of interview.  The pt provided permission to discuss medical plan with the family. Opportunity was given to ask question and all questions were answered satisfactorily.   Disposition:  Pt is from Home, admitted with GI bleeding, still on IV PPI and octreotide, GI workup pending, which precludes a safe discharge. Discharge to Home, when stable and cleared by GI.  Subjective: No significant events overnight, yesterday evening patient was complaining of chest pain, troponin was trending down, patient has generalized chronic body ache.  Patient was sleepy in the morning and stated that he is still having pain, unable to specify any location.  Seems to be resting comfortably, no any other active issues.  Denies any GI bleeding.   Physical Exam: General: NAD, lying comfortably Appear in no  distress, affect appropriate Eyes: PERRLA ENT: Oral Mucosa Clear, moist  Neck: no JVD,  Cardiovascular: S1 and S2 Present, no Murmur,  Respiratory: good respiratory effort, Bilateral Air entry equal and Decreased, no Crackles, no wheezes Abdomen: Bowel Sound present, Soft, distended and mild tenderness,  Skin: no rashes Extremities: mild Pedal edema, no calf tenderness Neurologic: without any new focal findings Gait not checked due to patient safety concerns  Vitals:   02/23/23 1802 02/23/23 2006 02/24/23 0312 02/24/23 0849  BP: 106/64 111/70 120/66 (!) 120/98  Pulse: 88 87 82 77  Resp: '16 18 16 17  '$ Temp: 97.7 F (36.5 C) (!) 97.5 F (36.4 C) 98 F (36.7 C) 98.3 F (36.8 C)  TempSrc: Oral Oral  Oral  SpO2: 100% 100% 99% 96%  Weight:      Height:        Intake/Output Summary (Last 24 hours) at 02/24/2023 1425 Last data filed at 02/24/2023 0551 Gross per 24 hour  Intake 2606.06 ml  Output 1200 ml  Net 1406.06 ml   Filed Weights   02/22/23 0905  Weight: 84.6 kg    Data Reviewed: I have personally reviewed and interpreted daily labs, tele strips, imagings as discussed above. I reviewed all nursing notes, pharmacy notes, vitals, pertinent old records I have discussed plan of care as described  above with RN and patient/family.  CBC: Recent Labs  Lab 02/22/23 0450 02/22/23 0918 02/22/23 1116 02/22/23 1900 02/22/23 2132 02/23/23 0321 02/23/23 0713 02/23/23 1712 02/24/23 0132  WBC 11.6*   < > 11.6* 9.2  --  7.5 9.8  --  5.4  NEUTROABS 8.4*  --   --   --   --   --   --   --   --   HGB 7.5*   < > 8.0* 7.2* 7.1* 7.9* 8.1* 9.0* 7.4*  HCT 23.7*   < > 24.2* 21.6* 20.9* 23.1* 23.9* 26.6* 22.6*  MCV 90.8   < > 86.7 87.1  --  86.2 86.3  --  90.8  PLT 108*   < > 130* 90*  --  88* 87*  --  68*   < > = values in this interval not displayed.   Basic Metabolic Panel: Recent Labs  Lab 02/22/23 0450 02/22/23 2132 02/23/23 0321 02/24/23 0132  NA 139 137 139 137  K 3.7  3.1* 3.3* 3.3*  CL 99 105 108 112*  CO2 '27 28 25 '$ 21*  GLUCOSE 160* 132* 146* 113*  BUN 30* 22* 20 14  CREATININE 1.00 0.67 0.59* 0.56*  CALCIUM 8.7* 7.5* 7.6* 7.2*  MG 1.6* 2.0 2.0 2.0  PHOS 2.9  --  2.1* 2.0*    Studies: No results found.  Scheduled Meds:  ezetimibe  10 mg Oral Daily   ferrous sulfate  325 mg Oral Q breakfast   folic acid  1 mg Oral Daily   gabapentin  300 mg Oral TID   hydrocortisone  20 mg Oral Daily   lactulose  20 g Oral BID   LORazepam  0-4 mg Oral Q12H   midodrine  10 mg Oral TID WC   multivitamin with minerals  1 tablet Oral Daily   nicotine  21 mg Transdermal Daily   [START ON 02/25/2023] pantoprazole  40 mg Intravenous Q12H   QUEtiapine  25 mg Oral QHS   rifaximin  550 mg Oral BID   rosuvastatin  40 mg Oral Daily   thiamine  100 mg Oral Daily   Or   thiamine  100 mg Intravenous Daily   Continuous Infusions:  sodium chloride Stopped (02/23/23 1806)   sodium chloride 20 mL/hr at 02/24/23 0351   cefTRIAXone (ROCEPHIN)  IV Stopped (02/24/23 0346)   lactated ringers     octreotide (SANDOSTATIN) 500 mcg in sodium chloride 0.9 % 250 mL (2 mcg/mL) infusion 50 mcg/hr (02/24/23 1157)   pantoprazole 8 mg/hr (02/24/23 0521)   potassium PHOSPHATE IVPB (in mmol) 30 mmol (02/24/23 0913)   PRN Meds: acetaminophen **OR** acetaminophen, methocarbamol, metoCLOPramide (REGLAN) injection, nitroGLYCERIN, mouth rinse, oxyCODONE, traZODone  Time spent: 35 minutes  Author: Val Riles. MD [facility] 02/24/2023 2:25 PM  To reach On-call, see care teams to locate the attending and reach out to them via [email or web address]. If 7PM-7AM, please contact night-coverage If you still have difficulty reaching the attending provider, please page the [facility] (Director on Call) for [facility] on amion for assistance.

## 2023-02-25 DIAGNOSIS — K922 Gastrointestinal hemorrhage, unspecified: Secondary | ICD-10-CM | POA: Diagnosis not present

## 2023-02-25 LAB — CBC
HCT: 24.9 % — ABNORMAL LOW (ref 39.0–52.0)
Hemoglobin: 7.8 g/dL — ABNORMAL LOW (ref 13.0–17.0)
MCH: 29.5 pg (ref 26.0–34.0)
MCHC: 31.3 g/dL (ref 30.0–36.0)
MCV: 94.3 fL (ref 80.0–100.0)
Platelets: 57 10*3/uL — ABNORMAL LOW (ref 150–400)
RBC: 2.64 MIL/uL — ABNORMAL LOW (ref 4.22–5.81)
RDW: 20.7 % — ABNORMAL HIGH (ref 11.5–15.5)
WBC: 3.9 10*3/uL — ABNORMAL LOW (ref 4.0–10.5)
nRBC: 0 % (ref 0.0–0.2)

## 2023-02-25 LAB — URINE DRUG SCREEN, QUALITATIVE (ARMC ONLY)
Amphetamines, Ur Screen: NOT DETECTED
Barbiturates, Ur Screen: NOT DETECTED
Benzodiazepine, Ur Scrn: POSITIVE — AB
Cannabinoid 50 Ng, Ur ~~LOC~~: NOT DETECTED
Cocaine Metabolite,Ur ~~LOC~~: POSITIVE — AB
MDMA (Ecstasy)Ur Screen: NOT DETECTED
Methadone Scn, Ur: NOT DETECTED
Opiate, Ur Screen: NOT DETECTED
Phencyclidine (PCP) Ur S: NOT DETECTED
Tricyclic, Ur Screen: NOT DETECTED

## 2023-02-25 LAB — BASIC METABOLIC PANEL
Anion gap: 7 (ref 5–15)
BUN: 6 mg/dL (ref 6–20)
CO2: 20 mmol/L — ABNORMAL LOW (ref 22–32)
Calcium: 7.7 mg/dL — ABNORMAL LOW (ref 8.9–10.3)
Chloride: 113 mmol/L — ABNORMAL HIGH (ref 98–111)
Creatinine, Ser: 0.56 mg/dL — ABNORMAL LOW (ref 0.61–1.24)
GFR, Estimated: >60 mL/min (ref >60)
Glucose, Bld: 135 mg/dL — ABNORMAL HIGH (ref 70–99)
Potassium: 3 mmol/L — ABNORMAL LOW (ref 3.5–5.1)
Sodium: 140 mmol/L (ref 135–145)

## 2023-02-25 LAB — PHOSPHORUS: Phosphorus: 2.3 mg/dL — ABNORMAL LOW (ref 2.5–4.6)

## 2023-02-25 LAB — MAGNESIUM: Magnesium: 1.9 mg/dL (ref 1.7–2.4)

## 2023-02-25 MED ORDER — K PHOS MONO-SOD PHOS DI & MONO 155-852-130 MG PO TABS
500.0000 mg | ORAL_TABLET | Freq: Four times a day (QID) | ORAL | Status: AC
  Administered 2023-02-25 (×2): 500 mg via ORAL
  Filled 2023-02-25 (×2): qty 2

## 2023-02-25 MED ORDER — POTASSIUM CHLORIDE CRYS ER 20 MEQ PO TBCR
40.0000 meq | EXTENDED_RELEASE_TABLET | ORAL | Status: AC
  Administered 2023-02-25 (×2): 40 meq via ORAL
  Filled 2023-02-25 (×2): qty 2

## 2023-02-25 MED ORDER — OXYCODONE HCL 5 MG PO TABS
10.0000 mg | ORAL_TABLET | Freq: Four times a day (QID) | ORAL | Status: DC | PRN
  Administered 2023-02-25 – 2023-03-05 (×32): 10 mg via ORAL
  Filled 2023-02-25 (×34): qty 2

## 2023-02-25 NOTE — Care Management Important Message (Signed)
Important Message  Patient Details  Name: Ethan Wells MRN: [id number] Date of Birth: 1971-08-04   Medicare Important Message Given:  Yes     [name] 02/25/2023, 1:42 PM

## 2023-02-25 NOTE — Progress Notes (Signed)
[facility] Progress Note  [name]    [id number]  DOA: 02/22/2023     Date of Service: the patient was seen and examined on 02/25/2023  Chief Complaint  Patient presents with   GI Problem   Rectal Bleeding   Brief hospital course: Ethan Wells is a 52 y.o. male with medical history significant of polysubstance abuse (alcohol, tobacco, cocaine, marijuana), GI bleeding, hypertension, hyperlipidemia, CAD, STEMI with BMS placement, CHF, depression with anxiety, adrenal insufficiency, cardiac arrest due to V-fib in the setting of STEMI, who presents with GI bleeding.    Patient was recently hospitalized from 2/1 - 2/16 due to GI bleeding related hemorrhagic shock. Pt states that he started having coffee-ground hematemesis again since yesterday.  He states that it totally he has had more than 20 times of hematemesis.  He also has black tarry stool.  Patient has nausea and abdominal pain.  His abdominal pain is diffuse, constant, aching, mild to moderate, nonradiating.  No fever or chills.  He has mild shortness breath and chest discomfort, no cough.  Denies symptoms of UTI.  No confusion.  He reports possible syncope but he is uncertain about this.  No unilateral numbness or tinglings or weakness extremities.   Patient was found to be hypotensive initially, with blood pressure 74/56, which improved to 92/53 after giving 3 L LR in ED.  ED w/up: Hb 10.7 on 01/29/2023 --> 7.5, troponin 46, WBC 11.6, INR 1.6, PTT 33, temperature normal, heart rate 100-120s, RR 30 --> 14, oxygen saturation 98% on room air.   EKG: Sinus rhythm, QTc 471, LAE, early R wave progression, frequent PVC.   Patient is admitted to stepdown as inpatient.  Dr. Marius Ditch of GI is consulted.   Assessment and Plan: Principal Problem:   GI bleeding Active Problems:   Acute blood loss anemia   Adrenal insufficiency (HCC)   CAD (coronary artery disease)   Myocardial injury   Hypomagnesemia   Chronic  systolic CHF (congestive heart failure) (HCC)   HLD (hyperlipidemia)   Thrombocytopenia (HCC)   Essential hypertension   Depression with anxiety   Polysubstance abuse ([place])   Morbid obesity ([place])   # Acute GI bleeding # Acute blood loss anemia Hb 7.1--8.1--7.4--7.8 S/p 2 unit of PRBC transfusion Anemia workup within normal range Continue IV ceftriaxone for SBP prophylaxis S/p IV fluid bolus, vital signs stable Continue pantoprazole IV infusion Continue octreotide IV infusion Continue Zofran as needed Monitor H&H and transfuse if hemoglobin less than 7 GI consulted, EGD could not be done due to urine tox positive for cocaine. Continue full liquid diet 3/12 UDS positive for cocaine and benzo, GI postponed EGD for now.    # Liver cirrhosis due to alcohol abuse Continue rifaximin 550 mg p.o. twice daily for hepatic encephalopathy, need to continue long-term on discharge Continue lactulose Ammonia 44--56--47 Check ammonia level Follow GI, EGD to rule out esophageal varices and portal gastropathy   # EtOH abuse, withdrawal symptoms Continue CIWA protocol EtOH abstinence counseling done   # Hypokalemia, potassium repleted # Hypophosphatemia, Phos repleted. # Hypomagnesemia, mag repleted. Monitor electrolytes and replete as needed  # Adrenal insufficiency (HCC) -Give Solu-Cortef 100 mg x 2 doses as stress doses -Continue home Cortef 20 mg daily  -Continue home midodrine 10 mg 3 times daily for hypotension  CAD (coronary artery disease) and myocardial injury: Troponin 46.  History of STEMI with BMS placement -Will not give aspirin due to GI bleeding -Crestor, Zetia -Troponin  172--101 trended down -Check 123456,  Chronic systolic CHF (congestive heart failure) [facility]): Patient had history of systolic CHF.  2D echo on 07/13/2019 showed EF of 45-50%.  His EF has improved in recent years.  2D echo on 01/17/2023 showed EF of 50-55%.  Patient does not have leg edema or JVD.  CHF is  compensatd -Hold diuretics due to hypotension -BNP 207    HLD (hyperlipidemia) -Crestor, Zetia Lipid profile LDL 62, triglyceride 53, within normal range  Thrombocytopenia ([place]): This is a chronic issue.  Platelet 103 on 11/21/2022, 158 on 01/29/2023.  platelet 108--57.  Likely due to alcohol abuse and possible liver cirrhosis. -Follow-up with CBC   Essential hypertension -Hold off blood pressure medications and diuretics due to hypotension -Hold Imdur nitroglycerin   Depression with anxiety -Seroquel   Polysubstance abuse: Including cocaine, tobacco, alcohol, marijuana -Did counseling about importance of quitting substance use -Nicotine patch -CIWA protocol -UDS positive for cocaine   Morbid obesity ([place]): Body weight while 4.2 kg, BMI 38.22 -Healthy diet and exercise -Encouraged losing weight     Body mass index is 31.04 kg/m.  Interventions:       Diet: Full liquid diet DVT Prophylaxis: SCD, pharmacological prophylaxis contraindicated due to GI bleeding    Advance goals of care discussion: Full code  Family Communication: family was NOT present at bedside, at the time of interview.  The pt provided permission to discuss medical plan with the family. Opportunity was given to ask question and all questions were answered satisfactorily.   Disposition:  Pt is from Home, admitted with GI bleeding, still on IV PPI and octreotide, GI workup pending, which precludes a safe discharge. Discharge to Home, when stable and cleared by GI.  Subjective: No significant events overnight, patient was resting heart rate, started that he does not know about the bleeding as he cannot see his stools.  Hemoglobin is stable, seems GI bleeding has stopped.  Patient denies any specific complaints.  Physical Exam: General: NAD, lying comfortably Appear in no distress, affect appropriate Eyes: PERRLA ENT: Oral Mucosa Clear, moist  Neck: no JVD,  Cardiovascular: S1 and S2 Present, no  Murmur,  Respiratory: good respiratory effort, Bilateral Air entry equal and Decreased, no Crackles, no wheezes Abdomen: Bowel Sound present, Soft, distended and mild tenderness,  Skin: no rashes Extremities: mild Pedal edema, no calf tenderness Neurologic: without any new focal findings Gait not checked due to patient safety concerns  Vitals:   02/24/23 1700 02/24/23 1931 02/25/23 0438 02/25/23 0820  BP: (!) 120/90 111/72 129/69 111/67  Pulse: 75 78 92 91  Resp: '16 16 18 18  '$ Temp: 98.1 F (36.7 C) 98.1 F (36.7 C) 97.9 F (36.6 C) 98.4 F (36.9 C)  TempSrc: Oral Oral  Oral  SpO2: 97% 98% 99% 100%  Weight:      Height:        Intake/Output Summary (Last 24 hours) at 02/25/2023 1507 Last data filed at 02/25/2023 1436 Gross per 24 hour  Intake 2288.52 ml  Output 1150 ml  Net 1138.52 ml   Filed Weights   02/22/23 0905  Weight: 84.6 kg    Data Reviewed: I have personally reviewed and interpreted daily labs, tele strips, imagings as discussed above. I reviewed all nursing notes, pharmacy notes, vitals, pertinent old records I have discussed plan of care as described above with RN and patient/family.  CBC: Recent Labs  Lab 02/22/23 0450 02/22/23 [id number] 02/22/23 1900 02/22/23 2132 02/23/23 0321 02/23/23 [id number] 02/23/23 1712  02/24/23 0132 02/24/23 1702 02/25/23 0918  WBC 11.6*   < > 9.2  --  7.5 9.8  --  5.4  --  3.9*  NEUTROABS 8.4*  --   --   --   --   --   --   --   --   --   HGB 7.5*   < > 7.2*   < > 7.9* 8.1* 9.0* 7.4* 7.4* 7.8*  HCT 23.7*   < > 21.6*   < > 23.1* 23.9* 26.6* 22.6* 23.9* 24.9*  MCV 90.8   < > 87.1  --  86.2 86.3  --  90.8  --  94.3  PLT 108*   < > 90*  --  88* 87*  --  68*  --  57*   < > = values in this interval not displayed.   Basic Metabolic Panel: Recent Labs  Lab 02/22/23 0450 02/22/23 2132 02/23/23 0321 02/24/23 0132 02/25/23 0918  NA 139 137 139 137 140  K 3.7 3.1* 3.3* 3.3* 3.0*  CL 99 105 108 112* 113*  CO2 '27 28 25 '$ 21* 20*   GLUCOSE 160* 132* 146* 113* 135*  BUN 30* 22* '20 14 6  '$ CREATININE 1.00 0.67 0.59* 0.56* 0.56*  CALCIUM 8.7* 7.5* 7.6* 7.2* 7.7*  MG 1.6* 2.0 2.0 2.0 1.9  PHOS 2.9  --  2.1* 2.0* 2.3*    Studies: No results found.  Scheduled Meds:  ezetimibe  10 mg Oral Daily   ferrous sulfate  325 mg Oral Q breakfast   folic acid  1 mg Oral Daily   gabapentin  300 mg Oral TID   hydrocortisone  20 mg Oral Daily   lactulose  20 g Oral BID   LORazepam  0-4 mg Oral Q12H   midodrine  10 mg Oral TID WC   multivitamin with minerals  1 tablet Oral Daily   nicotine  21 mg Transdermal Daily   pantoprazole  40 mg Intravenous Q12H   phosphorus  500 mg Oral QID   potassium chloride  40 mEq Oral Q4H   QUEtiapine  25 mg Oral QHS   rifaximin  550 mg Oral BID   rosuvastatin  40 mg Oral Daily   thiamine  100 mg Oral Daily   Or   thiamine  100 mg Intravenous Daily   Continuous Infusions:  sodium chloride Stopped (02/23/23 1806)   sodium chloride 20 mL/hr at 02/25/23 0333   cefTRIAXone (ROCEPHIN)  IV 2 g (02/25/23 0333)   lactated ringers     octreotide (SANDOSTATIN) 500 mcg in sodium chloride 0.9 % 250 mL (2 mcg/mL) infusion 50 mcg/hr (02/25/23 1409)   PRN Meds: acetaminophen **OR** acetaminophen, methocarbamol, metoCLOPramide (REGLAN) injection, nitroGLYCERIN, mouth rinse, oxyCODONE, traZODone  Time spent: 35 minutes  Author: Val Riles. MD [facility] 02/25/2023 3:07 PM  To reach On-call, see care teams to locate the attending and reach out to them via [email or web address]. If 7PM-7AM, please contact night-coverage If you still have difficulty reaching the attending provider, please page the [facility] (Director on Call) for [facility] on amion for assistance.

## 2023-02-26 DIAGNOSIS — K922 Gastrointestinal hemorrhage, unspecified: Secondary | ICD-10-CM | POA: Diagnosis not present

## 2023-02-26 LAB — PHOSPHORUS: Phosphorus: 2.7 mg/dL (ref 2.5–4.6)

## 2023-02-26 LAB — BASIC METABOLIC PANEL
Anion gap: 4 — ABNORMAL LOW (ref 5–15)
BUN: <5 mg/dL — ABNORMAL LOW (ref 6–20)
CO2: 21 mmol/L — ABNORMAL LOW (ref 22–32)
Calcium: 7.9 mg/dL — ABNORMAL LOW (ref 8.9–10.3)
Chloride: 114 mmol/L — ABNORMAL HIGH (ref 98–111)
Creatinine, Ser: 0.65 mg/dL (ref 0.61–1.24)
GFR, Estimated: >60 mL/min (ref >60)
Glucose, Bld: 139 mg/dL — ABNORMAL HIGH (ref 70–99)
Potassium: 3.8 mmol/L (ref 3.5–5.1)
Sodium: 139 mmol/L (ref 135–145)

## 2023-02-26 LAB — CBC
HCT: 27 % — ABNORMAL LOW (ref 39.0–52.0)
Hemoglobin: 8.4 g/dL — ABNORMAL LOW (ref 13.0–17.0)
MCH: 29.8 pg (ref 26.0–34.0)
MCHC: 31.1 g/dL (ref 30.0–36.0)
MCV: 95.7 fL (ref 80.0–100.0)
Platelets: 55 10*3/uL — ABNORMAL LOW (ref 150–400)
RBC: 2.82 MIL/uL — ABNORMAL LOW (ref 4.22–5.81)
RDW: 21.1 % — ABNORMAL HIGH (ref 11.5–15.5)
WBC: 5.4 10*3/uL (ref 4.0–10.5)
nRBC: 0 % (ref 0.0–0.2)

## 2023-02-26 LAB — URINE DRUG SCREEN, QUALITATIVE (ARMC ONLY)
Amphetamines, Ur Screen: NOT DETECTED
Barbiturates, Ur Screen: NOT DETECTED
Benzodiazepine, Ur Scrn: POSITIVE — AB
Cannabinoid 50 Ng, Ur ~~LOC~~: NOT DETECTED
Cocaine Metabolite,Ur ~~LOC~~: POSITIVE — AB
MDMA (Ecstasy)Ur Screen: NOT DETECTED
Methadone Scn, Ur: NOT DETECTED
Opiate, Ur Screen: POSITIVE — AB
Phencyclidine (PCP) Ur S: NOT DETECTED
Tricyclic, Ur Screen: NOT DETECTED

## 2023-02-26 LAB — MAGNESIUM: Magnesium: 1.9 mg/dL (ref 1.7–2.4)

## 2023-02-26 MED ORDER — SODIUM CHLORIDE 0.9% FLUSH: 10.0000 mL | INTRAVENOUS | Status: DC | PRN

## 2023-02-26 MED ORDER — SODIUM CHLORIDE 0.9% FLUSH
10.0000 mL | Freq: Two times a day (BID) | INTRAVENOUS | Status: DC
  Administered 2023-02-26 – 2023-03-05 (×8): 10 mL

## 2023-02-26 NOTE — Progress Notes (Signed)
Rn went to hang 0400 iv ABX, when RN noticed the other IV site above in the upper arm bruised/red,raised and cold to the touch. RN stopped octreotide infusion and removed IV. Catheter was intact and IV was bleeding. Patient then accidentally removed the second IV when trying to help the RN hold ice to the affected area. IV team consulted at this time

## 2023-02-26 NOTE — Progress Notes (Signed)
Mobility Specialist - Progress Note   02/26/23 0945  Mobility  Activity Refused mobility   Pt denied OOB transfer/amb to the recliner due to "dizziness and fear or falling". Pt educated of safety protocol for transfers, still denied. Pt left supine with alarm set and needs within reach.   [place] Mobility Specialist 02/26/23 9:47 AM

## 2023-02-26 NOTE — Progress Notes (Signed)
[facility] Progress Note  [name]    [id number]  DOA: 02/22/2023     Date of Service: the patient was seen and examined on 02/26/2023  Chief Complaint  Patient presents with   GI Problem   Rectal Bleeding   Brief hospital course: Ethan Wells is a 52 y.o. male with medical history significant of polysubstance abuse (alcohol, tobacco, cocaine, marijuana), GI bleeding, hypertension, hyperlipidemia, CAD, STEMI with BMS placement, CHF, depression with anxiety, adrenal insufficiency, cardiac arrest due to V-fib in the setting of STEMI, who presents with GI bleeding.    Patient was recently hospitalized from 2/1 - 2/16 due to GI bleeding related hemorrhagic shock. Pt states that he started having coffee-ground hematemesis again since yesterday.  He states that it totally he has had more than 20 times of hematemesis.  He also has black tarry stool.  Patient has nausea and abdominal pain.  His abdominal pain is diffuse, constant, aching, mild to moderate, nonradiating.  No fever or chills.  He has mild shortness breath and chest discomfort, no cough.  Denies symptoms of UTI.  No confusion.  He reports possible syncope but he is uncertain about this.  No unilateral numbness or tinglings or weakness extremities.   Patient was found to be hypotensive initially, with blood pressure 74/56, which improved to 92/53 after giving 3 L LR in ED.  ED w/up: Hb 10.7 on 01/29/2023 --> 7.5, troponin 46, WBC 11.6, INR 1.6, PTT 33, temperature normal, heart rate 100-120s, RR 30 --> 14, oxygen saturation 98% on room air.   EKG: Sinus rhythm, QTc 471, LAE, early R wave progression, frequent PVC.   Patient is admitted to stepdown as inpatient.  Dr. Marius Ditch of GI is consulted.   Assessment and Plan: Principal Problem:   GI bleeding Active Problems:   Acute blood loss anemia   Adrenal insufficiency (HCC)   CAD (coronary artery disease)   Myocardial injury   Hypomagnesemia   Chronic  systolic CHF (congestive heart failure) (HCC)   HLD (hyperlipidemia)   Thrombocytopenia (HCC)   Essential hypertension   Depression with anxiety   Polysubstance abuse ([place])   Morbid obesity ([place])   # Acute GI bleeding # Acute blood loss anemia Hb 7.1--8.1--7.4--7.8--8.4 S/p 2 unit of PRBC transfusion Anemia workup within normal range Continue IV ceftriaxone for SBP prophylaxis S/p IV fluid bolus, vital signs stable Continue pantoprazole IV infusion Continue octreotide IV infusion Continue Zofran as needed Monitor H&H and transfuse if hemoglobin less than 7 GI consulted, EGD could not be done due to urine tox positive for cocaine. Continue full liquid diet 3/12 UDS positive for cocaine and benzo, GI postponed EGD for now. 3/13 UDS positive for cocaine, GI recommended to repeat UDS on Friday   # Liver cirrhosis due to alcohol abuse Continue rifaximin 550 mg p.o. twice daily for hepatic encephalopathy, need to continue long-term on discharge Continue lactulose Ammonia 44--56--47 Check ammonia level Follow GI, EGD to rule out esophageal varices and portal gastropathy   # EtOH abuse, withdrawal symptoms Continue CIWA protocol EtOH abstinence counseling done   # Hypokalemia, potassium repleted # Hypophosphatemia, Phos repleted. # Hypomagnesemia, mag repleted. Monitor electrolytes and replete as needed  # Adrenal insufficiency (HCC) -Give Solu-Cortef 100 mg x 2 doses as stress doses -Continue home Cortef 20 mg daily  -Continue home midodrine 10 mg 3 times daily for hypotension  CAD (coronary artery disease) and myocardial injury: Troponin 46.  History of STEMI with BMS placement -  Will not give aspirin due to GI bleeding -Crestor, Zetia -Troponin 172--101 trended down -A1c 5.6,  Chronic systolic CHF (congestive heart failure) [facility]): Patient had history of systolic CHF.  2D echo on 07/13/2019 showed EF of 45-50%.  His EF has improved in recent years.  2D echo on 01/17/2023  showed EF of 50-55%.  Patient does not have leg edema or JVD.  CHF is compensatd -Hold diuretics due to hypotension -BNP 207    HLD (hyperlipidemia) -Crestor, Zetia Lipid profile LDL 62, triglyceride 53, within normal range  Thrombocytopenia ([place]): This is a chronic issue.  Platelet 103 on 11/21/2022, 158 on 01/29/2023.  platelet 108--55.  Likely due to alcohol abuse and possible liver cirrhosis. -Follow-up with CBC   Essential hypertension -Hold off blood pressure medications and diuretics due to hypotension -Hold Imdur nitroglycerin   Depression with anxiety -Seroquel   Polysubstance abuse: Including cocaine, tobacco, alcohol, marijuana -Did counseling about importance of quitting substance use -Nicotine patch -CIWA protocol -UDS positive for cocaine   Morbid obesity ([place]): Body weight while 4.2 kg, BMI 38.22 -Healthy diet and exercise -Encouraged losing weight    Body mass index is 31.04 kg/m.  Interventions:     Diet: Soft diet  DVT Prophylaxis: SCD, pharmacological prophylaxis contraindicated due to GI bleeding    Advance goals of care discussion: Full code  Family Communication: family was NOT present at bedside, at the time of interview.  The pt provided permission to discuss medical plan with the family. Opportunity was given to ask question and all questions were answered satisfactorily.   Disposition:  Pt is from Home, admitted with GI bleeding, still on IV PPI and octreotide, GI workup pending, which precludes a safe discharge. Discharge to Home, when stable and cleared by GI.  Subjective: No significant events overnight, patient denies any bleeding, patient has generalized body ache, complaining of migraine in the morning.  Denies any abdominal pain, no chest pain or palpitations no any other active issues.  Physical Exam: General: NAD, lying comfortably Appear in no distress, affect appropriate Eyes: PERRLA ENT: Oral Mucosa Clear, moist  Neck: no JVD,   Cardiovascular: S1 and S2 Present, no Murmur,  Respiratory: good respiratory effort, Bilateral Air entry equal and Decreased, no Crackles, no wheezes Abdomen: Bowel Sound present, Soft, distended and mild tenderness,  Skin: no rashes Extremities: mild Pedal edema, no calf tenderness Neurologic: without any new focal findings Gait not checked due to patient safety concerns  Vitals:   02/25/23 1758 02/25/23 1908 02/26/23 0336 02/26/23 0741  BP: 106/63 106/64 132/86 (!) 96/56  Pulse: 83 82 90 86  Resp: '16 16 16 18  '$ Temp: 98.4 F (36.9 C) 98.4 F (36.9 C) 97.7 F (36.5 C) 98.1 F (36.7 C)  TempSrc: Oral Oral  Oral  SpO2: 100% 100% 100% 100%  Weight:      Height:        Intake/Output Summary (Last 24 hours) at 02/26/2023 1438 Last data filed at 02/26/2023 1057 Gross per 24 hour  Intake 840 ml  Output 1200 ml  Net -360 ml   Filed Weights   02/22/23 0905  Weight: 84.6 kg    Data Reviewed: I have personally reviewed and interpreted daily labs, tele strips, imagings as discussed above. I reviewed all nursing notes, pharmacy notes, vitals, pertinent old records I have discussed plan of care as described above with RN and patient/family.  CBC: Recent Labs  Lab 02/22/23 0450 02/22/23 0918 02/23/23 0321 02/23/23 0713 02/23/23 1712 02/24/23  0132 02/24/23 1702 02/25/23 0918 02/26/23 0519  WBC 11.6*   < > 7.5 9.8  --  5.4  --  3.9* 5.4  NEUTROABS 8.4*  --   --   --   --   --   --   --   --   HGB 7.5*   < > 7.9* 8.1* 9.0* 7.4* 7.4* 7.8* 8.4*  HCT 23.7*   < > 23.1* 23.9* 26.6* 22.6* 23.9* 24.9* 27.0*  MCV 90.8   < > 86.2 86.3  --  90.8  --  94.3 95.7  PLT 108*   < > 88* 87*  --  68*  --  57* 55*   < > = values in this interval not displayed.   Basic Metabolic Panel: Recent Labs  Lab 02/22/23 0450 02/22/23 2132 02/23/23 0321 02/24/23 0132 02/25/23 0918 02/26/23 0519  NA 139 137 139 137 140 139  K 3.7 3.1* 3.3* 3.3* 3.0* 3.8  CL 99 105 108 112* 113* 114*  CO2 '27  28 25 '$ 21* 20* 21*  GLUCOSE 160* 132* 146* 113* 135* 139*  BUN 30* 22* '20 14 6 '$ <5*  CREATININE 1.00 0.67 0.59* 0.56* 0.56* 0.65  CALCIUM 8.7* 7.5* 7.6* 7.2* 7.7* 7.9*  MG 1.6* 2.0 2.0 2.0 1.9 1.9  PHOS 2.9  --  2.1* 2.0* 2.3* 2.7    Studies: No results found.  Scheduled Meds:  ezetimibe  10 mg Oral Daily   ferrous sulfate  325 mg Oral Q breakfast   folic acid  1 mg Oral Daily   gabapentin  300 mg Oral TID   hydrocortisone  20 mg Oral Daily   lactulose  20 g Oral BID   midodrine  10 mg Oral TID WC   multivitamin with minerals  1 tablet Oral Daily   nicotine  21 mg Transdermal Daily   pantoprazole  40 mg Intravenous Q12H   QUEtiapine  25 mg Oral QHS   rifaximin  550 mg Oral BID   rosuvastatin  40 mg Oral Daily   sodium chloride flush  10-40 mL Intracatheter Q12H   thiamine  100 mg Oral Daily   Or   thiamine  100 mg Intravenous Daily   Continuous Infusions:  sodium chloride Stopped (02/23/23 1806)   sodium chloride 20 mL/hr at 02/26/23 0620   cefTRIAXone (ROCEPHIN)  IV 200 mL/hr at 02/26/23 [id number]   lactated ringers     octreotide (SANDOSTATIN) 500 mcg in sodium chloride 0.9 % 250 mL (2 mcg/mL) infusion 50 mcg/hr (02/26/23 0620)   PRN Meds: acetaminophen **OR** acetaminophen, methocarbamol, metoCLOPramide (REGLAN) injection, nitroGLYCERIN, mouth rinse, oxyCODONE, sodium chloride flush, traZODone  Time spent: 35 minutes  Author: Val Riles. MD [facility] 02/26/2023 2:38 PM  To reach On-call, see care teams to locate the attending and reach out to them via [email or web address]. If 7PM-7AM, please contact night-coverage If you still have difficulty reaching the attending provider, please page the [facility] (Director on Call) for [facility] on amion for assistance.

## 2023-02-27 DIAGNOSIS — K922 Gastrointestinal hemorrhage, unspecified: Secondary | ICD-10-CM | POA: Diagnosis not present

## 2023-02-27 LAB — BASIC METABOLIC PANEL
Anion gap: 4 — ABNORMAL LOW (ref 5–15)
BUN: 5 mg/dL — ABNORMAL LOW (ref 6–20)
CO2: 20 mmol/L — ABNORMAL LOW (ref 22–32)
Calcium: 8 mg/dL — ABNORMAL LOW (ref 8.9–10.3)
Chloride: 112 mmol/L — ABNORMAL HIGH (ref 98–111)
Creatinine, Ser: 0.58 mg/dL — ABNORMAL LOW (ref 0.61–1.24)
GFR, Estimated: >60 mL/min (ref >60)
Glucose, Bld: 112 mg/dL — ABNORMAL HIGH (ref 70–99)
Potassium: 4 mmol/L (ref 3.5–5.1)
Sodium: 136 mmol/L (ref 135–145)

## 2023-02-27 LAB — CBC
HCT: 25.7 % — ABNORMAL LOW (ref 39.0–52.0)
Hemoglobin: 8.1 g/dL — ABNORMAL LOW (ref 13.0–17.0)
MCH: 29.9 pg (ref 26.0–34.0)
MCHC: 31.5 g/dL (ref 30.0–36.0)
MCV: 94.8 fL (ref 80.0–100.0)
Platelets: 58 10*3/uL — ABNORMAL LOW (ref 150–400)
RBC: 2.71 MIL/uL — ABNORMAL LOW (ref 4.22–5.81)
RDW: 20.9 % — ABNORMAL HIGH (ref 11.5–15.5)
WBC: 5.5 10*3/uL (ref 4.0–10.5)
nRBC: 0 % (ref 0.0–0.2)

## 2023-02-27 LAB — PHOSPHORUS: Phosphorus: 2.6 mg/dL (ref 2.5–4.6)

## 2023-02-27 LAB — MAGNESIUM: Magnesium: 1.8 mg/dL (ref 1.7–2.4)

## 2023-02-27 LAB — AMMONIA: Ammonia: 50 umol/L — ABNORMAL HIGH (ref 9–35)

## 2023-02-27 MED ORDER — PANTOPRAZOLE SODIUM 40 MG PO TBEC
40.0000 mg | DELAYED_RELEASE_TABLET | Freq: Two times a day (BID) | ORAL | Status: DC
  Administered 2023-02-27 – 2023-03-05 (×13): 40 mg via ORAL
  Filled 2023-02-27 (×14): qty 1

## 2023-02-27 NOTE — Progress Notes (Signed)
Mobility Specialist - Progress Note   02/27/23 0938  Mobility  Activity Refused mobility   Pt refused OOB transfer to the recliner due to "having to wear an ice pack" and "waiting to see what the doctors are going to do about an iv". Pt left supine with alarm set and needs within reach.   Candie [place] Mobility Specialist 02/27/23 9:40 AM

## 2023-02-27 NOTE — Progress Notes (Addendum)
Patient's IV infiltrated Arm is swollen and bruised. Nurse removed Midline and applied ice to site. Will contact IV nurse to assist with another midline line placement as his veins blow easily.

## 2023-02-27 NOTE — Progress Notes (Signed)
[facility] Progress Note  [name]    [id number]  DOA: 02/22/2023     Date of Service: the patient was seen and examined on 02/27/2023  Chief Complaint  Patient presents with   GI Problem   Rectal Bleeding   Brief hospital course: Ethan Wells is a 52 y.o. male with medical history significant of polysubstance abuse (alcohol, tobacco, cocaine, marijuana), GI bleeding, hypertension, hyperlipidemia, CAD, STEMI with BMS placement, CHF, depression with anxiety, adrenal insufficiency, cardiac arrest due to V-fib in the setting of STEMI, who presents with GI bleeding.    Patient was recently hospitalized from 2/1 - 2/16 due to GI bleeding related hemorrhagic shock. Pt states that he started having coffee-ground hematemesis again since yesterday.  He states that it totally he has had more than 20 times of hematemesis.  He also has black tarry stool.  Patient has nausea and abdominal pain.  His abdominal pain is diffuse, constant, aching, mild to moderate, nonradiating.  No fever or chills.  He has mild shortness breath and chest discomfort, no cough.  Denies symptoms of UTI.  No confusion.  He reports possible syncope but he is uncertain about this.  No unilateral numbness or tinglings or weakness extremities.   Patient was found to be hypotensive initially, with blood pressure 74/56, which improved to 92/53 after giving 3 L LR in ED.  ED w/up: Hb 10.7 on 01/29/2023 --> 7.5, troponin 46, WBC 11.6, INR 1.6, PTT 33, temperature normal, heart rate 100-120s, RR 30 --> 14, oxygen saturation 98% on room air.   EKG: Sinus rhythm, QTc 471, LAE, early R wave progression, frequent PVC.   Patient is admitted to stepdown as inpatient.  Dr. Marius Ditch of GI is consulted.   Assessment and Plan: Principal Problem:   GI bleeding Active Problems:   Acute blood loss anemia   Adrenal insufficiency (HCC)   CAD (coronary artery disease)   Myocardial injury   Hypomagnesemia   Chronic  systolic CHF (congestive heart failure) (HCC)   HLD (hyperlipidemia)   Thrombocytopenia (HCC)   Essential hypertension   Depression with anxiety   Polysubstance abuse ([place])   Morbid obesity ([place])   # Acute GI bleeding # Acute blood loss anemia Hb 7.1--8.1--7.4--7.8--8.4--8.1 S/p 2 unit of PRBC transfusion Anemia workup within normal range Continue IV ceftriaxone for SBP prophylaxis S/p IV fluid bolus, vital signs stable S/p pantoprazole IV infusion, switch to pantoprazole IV push and then pantoprazole 40 mg p.o. twice daily as patient lost IV on 3/14 S/p octreotide IV infusion DC'd on 3/14 Continue Zofran as needed Monitor H&H and transfuse if hemoglobin less than 7 GI consulted, EGD could not be done due to urine tox positive for cocaine. Continue full liquid diet 3/12 UDS positive for cocaine and benzo, GI postponed EGD for now. 3/13 UDS positive for cocaine, GI recommended to repeat UDS on Friday   # Liver cirrhosis due to alcohol abuse Continue rifaximin 550 mg p.o. twice daily for hepatic encephalopathy, need to continue long-term on discharge Continue lactulose Ammonia 44--56--47--50 Check ammonia level Follow GI, EGD to rule out esophageal varices and portal gastropathy   # EtOH abuse, withdrawal symptoms Continue CIWA protocol EtOH abstinence counseling done   # Hypokalemia, potassium repleted # Hypophosphatemia, Phos repleted. # Hypomagnesemia, mag repleted. Monitor electrolytes and replete as needed  # Adrenal insufficiency (HCC) -Give Solu-Cortef 100 mg x 2 doses as stress doses -Continue home Cortef 20 mg daily  -Continue home midodrine 10 mg  3 times daily for hypotension  CAD (coronary artery disease) and myocardial injury: Troponin 46.  History of STEMI with BMS placement -Will not give aspirin due to GI bleeding -Crestor, Zetia -Troponin 172--101 trended down -A1c 5.6,  Chronic systolic CHF (congestive heart failure) [facility]): Patient had history of  systolic CHF.  2D echo on 07/13/2019 showed EF of 45-50%.  His EF has improved in recent years.  2D echo on 01/17/2023 showed EF of 50-55%.  Patient does not have leg edema or JVD.  CHF is compensatd -Hold diuretics due to hypotension -BNP 207    HLD (hyperlipidemia) -Crestor, Zetia Lipid profile LDL 62, triglyceride 53, within normal range  Thrombocytopenia ([place]): This is a chronic issue.  Platelet 103 on 11/21/2022, 158 on 01/29/2023.  platelet 108--50.  Likely due to alcohol abuse and possible liver cirrhosis. -Follow-up with CBC   Essential hypertension -Hold off blood pressure medications and diuretics due to hypotension -Hold Imdur nitroglycerin   Depression with anxiety -Seroquel   Polysubstance abuse: Including cocaine, tobacco, alcohol, marijuana -Did counseling about importance of quitting substance use -Nicotine patch -CIWA protocol -UDS positive for cocaine    Obesity, Body mass index is 31.04 kg/m.  Interventions: -Healthy diet and exercise -Encouraged losing weight      Diet: Soft diet  DVT Prophylaxis: SCD, pharmacological prophylaxis contraindicated due to GI bleeding    Advance goals of care discussion: Full code  Family Communication: family was NOT present at bedside, at the time of interview.  The pt provided permission to discuss medical plan with the family. Opportunity was given to ask question and all questions were answered satisfactorily.   Disposition:  Pt is from Home, admitted with GI bleeding, s/p IV PPI and octreotide, GI workup pending, which precludes a safe discharge. Discharge to Home, when stable and cleared by GI.  Subjective: No significant events overnight, patient was in the restroom, he came out after some time and he said that he had a BM but no GI bleeding.  Patient was complaining of generalized body ache.  Denies any chest pain or palpitation, no any other active issues.   Physical Exam: General: NAD, lying comfortably Appear in  no distress, affect appropriate Eyes: PERRLA ENT: Oral Mucosa Clear, moist  Neck: no JVD,  Cardiovascular: S1 and S2 Present, no Murmur,  Respiratory: good respiratory effort, Bilateral Air entry equal and Decreased, no Crackles, no wheezes Abdomen: Bowel Sound present, Soft, distended and mild tenderness,  Skin: no rashes Extremities: mild Pedal edema, no calf tenderness Neurologic: without any new focal findings Gait not checked due to patient safety concerns  Vitals:   02/26/23 1532 02/26/23 2036 02/27/23 0351 02/27/23 0812  BP: 104/62 (!) 113/57 122/67 113/65  Pulse: 84 64 66 84  Resp: '16 18 18 18  '$ Temp: 98.1 F (36.7 C) 98.2 F (36.8 C) 97.8 F (36.6 C) 98.2 F (36.8 C)  TempSrc: Oral  Oral Oral  SpO2: 100% 100% 100% 100%  Weight:      Height:        Intake/Output Summary (Last 24 hours) at 02/27/2023 1349 Last data filed at 02/27/2023 0836 Gross per 24 hour  Intake 2028.4 ml  Output 950 ml  Net 1078.4 ml   Filed Weights   02/22/23 0905  Weight: 84.6 kg    Data Reviewed: I have personally reviewed and interpreted daily labs, tele strips, imagings as discussed above. I reviewed all nursing notes, pharmacy notes, vitals, pertinent old records I have discussed plan of  care as described above with RN and patient/family.  CBC: Recent Labs  Lab 02/22/23 0450 02/22/23 0918 02/23/23 0713 02/23/23 1712 02/24/23 0132 02/24/23 1702 02/25/23 0918 02/26/23 0519 02/27/23 0558  WBC 11.6*   < > 9.8  --  5.4  --  3.9* 5.4 5.5  NEUTROABS 8.4*  --   --   --   --   --   --   --   --   HGB 7.5*   < > 8.1*   < > 7.4* 7.4* 7.8* 8.4* 8.1*  HCT 23.7*   < > 23.9*   < > 22.6* 23.9* 24.9* 27.0* 25.7*  MCV 90.8   < > 86.3  --  90.8  --  94.3 95.7 94.8  PLT 108*   < > 87*  --  68*  --  57* 55* 58*   < > = values in this interval not displayed.   Basic Metabolic Panel: Recent Labs  Lab 02/23/23 0321 02/24/23 0132 02/25/23 0918 02/26/23 0519 02/27/23 0558  NA 139 137 140  139 136  K 3.3* 3.3* 3.0* 3.8 4.0  CL 108 112* 113* 114* 112*  CO2 25 21* 20* 21* 20*  GLUCOSE 146* 113* 135* 139* 112*  BUN '20 14 6 '$ <5* 5*  CREATININE 0.59* 0.56* 0.56* 0.65 0.58*  CALCIUM 7.6* 7.2* 7.7* 7.9* 8.0*  MG 2.0 2.0 1.9 1.9 1.8  PHOS 2.1* 2.0* 2.3* 2.7 2.6    Studies: No results found.  Scheduled Meds:  ezetimibe  10 mg Oral Daily   ferrous sulfate  325 mg Oral Q breakfast   folic acid  1 mg Oral Daily   gabapentin  300 mg Oral TID   hydrocortisone  20 mg Oral Daily   lactulose  20 g Oral BID   midodrine  10 mg Oral TID WC   multivitamin with minerals  1 tablet Oral Daily   nicotine  21 mg Transdermal Daily   pantoprazole  40 mg Oral BID   QUEtiapine  25 mg Oral QHS   rifaximin  550 mg Oral BID   rosuvastatin  40 mg Oral Daily   sodium chloride flush  10-40 mL Intracatheter Q12H   thiamine  100 mg Oral Daily   Or   thiamine  100 mg Intravenous Daily   Continuous Infusions:  sodium chloride Stopped (02/23/23 1806)   sodium chloride Stopped (02/27/23 0900)   lactated ringers     PRN Meds: acetaminophen **OR** acetaminophen, methocarbamol, metoCLOPramide (REGLAN) injection, nitroGLYCERIN, mouth rinse, oxyCODONE, sodium chloride flush, traZODone  Time spent: 35 minutes  Author: Val Riles. MD [facility] 02/27/2023 1:49 PM  To reach On-call, see care teams to locate the attending and reach out to them via [email or web address]. If 7PM-7AM, please contact night-coverage If you still have difficulty reaching the attending provider, please page the [facility] (Director on Call) for [facility] on amion for assistance.

## 2023-02-27 NOTE — Progress Notes (Signed)
Tele called to report nonsustained heart rate of 130-140s. Went into patient's room and he was coughing. Heart rate decreased to the 80s afterwards. Patient denied any additional symptoms.

## 2023-02-27 NOTE — Progress Notes (Signed)
Received IVT consult for PIV insertion. Pt has required  new IV access daily since admission. Secure chat sent to RN who is agreeable to consult with provider to switch Protonix IVP to oral. RN to reconsult PRN.

## 2023-02-28 ENCOUNTER — Inpatient Hospital Stay: Payer: Medicare HMO

## 2023-02-28 DIAGNOSIS — K922 Gastrointestinal hemorrhage, unspecified: Secondary | ICD-10-CM | POA: Diagnosis not present

## 2023-02-28 DIAGNOSIS — K709 Alcoholic liver disease, unspecified: Secondary | ICD-10-CM | POA: Diagnosis not present

## 2023-02-28 DIAGNOSIS — D62 Acute posthemorrhagic anemia: Secondary | ICD-10-CM | POA: Diagnosis not present

## 2023-02-28 LAB — URINE DRUG SCREEN, QUALITATIVE (ARMC ONLY)
Amphetamines, Ur Screen: NOT DETECTED
Barbiturates, Ur Screen: NOT DETECTED
Benzodiazepine, Ur Scrn: POSITIVE — AB
Cannabinoid 50 Ng, Ur ~~LOC~~: NOT DETECTED
Cocaine Metabolite,Ur ~~LOC~~: NOT DETECTED
MDMA (Ecstasy)Ur Screen: NOT DETECTED
Methadone Scn, Ur: NOT DETECTED
Opiate, Ur Screen: POSITIVE — AB
Phencyclidine (PCP) Ur S: NOT DETECTED
Tricyclic, Ur Screen: NOT DETECTED

## 2023-02-28 LAB — CBC
HCT: 25.9 % — ABNORMAL LOW (ref 39.0–52.0)
Hemoglobin: 8.1 g/dL — ABNORMAL LOW (ref 13.0–17.0)
MCH: 30 pg (ref 26.0–34.0)
MCHC: 31.3 g/dL (ref 30.0–36.0)
MCV: 95.9 fL (ref 80.0–100.0)
Platelets: 77 10*3/uL — ABNORMAL LOW (ref 150–400)
RBC: 2.7 MIL/uL — ABNORMAL LOW (ref 4.22–5.81)
RDW: 21 % — ABNORMAL HIGH (ref 11.5–15.5)
WBC: 5.9 10*3/uL (ref 4.0–10.5)
nRBC: 0 % (ref 0.0–0.2)

## 2023-02-28 LAB — BASIC METABOLIC PANEL
Anion gap: 10 (ref 5–15)
BUN: 7 mg/dL (ref 6–20)
CO2: 20 mmol/L — ABNORMAL LOW (ref 22–32)
Calcium: 8.5 mg/dL — ABNORMAL LOW (ref 8.9–10.3)
Chloride: 110 mmol/L (ref 98–111)
Creatinine, Ser: 0.57 mg/dL — ABNORMAL LOW (ref 0.61–1.24)
GFR, Estimated: >60 mL/min (ref >60)
Glucose, Bld: 108 mg/dL — ABNORMAL HIGH (ref 70–99)
Potassium: 4.1 mmol/L (ref 3.5–5.1)
Sodium: 140 mmol/L (ref 135–145)

## 2023-02-28 LAB — PROTIME-INR
INR: 1.3 — ABNORMAL HIGH (ref 0.8–1.2)
Prothrombin Time: 16.2 seconds — ABNORMAL HIGH (ref 11.4–15.2)

## 2023-02-28 LAB — HEPATIC FUNCTION PANEL
ALT: 21 U/L (ref 0–44)
AST: 44 U/L — ABNORMAL HIGH (ref 15–41)
Albumin: 2.5 g/dL — ABNORMAL LOW (ref 3.5–5.0)
Alkaline Phosphatase: 129 U/L — ABNORMAL HIGH (ref 38–126)
Bilirubin, Direct: 0.4 mg/dL — ABNORMAL HIGH (ref 0.0–0.2)
Indirect Bilirubin: 0.9 mg/dL (ref 0.3–0.9)
Total Bilirubin: 1.3 mg/dL — ABNORMAL HIGH (ref 0.3–1.2)
Total Protein: 5.7 g/dL — ABNORMAL LOW (ref 6.5–8.1)

## 2023-02-28 LAB — MAGNESIUM: Magnesium: 1.8 mg/dL (ref 1.7–2.4)

## 2023-02-28 LAB — AMMONIA: Ammonia: 50 umol/L — ABNORMAL HIGH (ref 9–35)

## 2023-02-28 LAB — PHOSPHORUS: Phosphorus: 2.7 mg/dL (ref 2.5–4.6)

## 2023-02-28 MED ORDER — SODIUM CHLORIDE 0.9 % IV SOLN: INTRAVENOUS | Status: DC

## 2023-02-28 NOTE — Progress Notes (Addendum)
Patient is alert and oriented x4. Ambulates w/walker and standby assistance. Educated patient about obtaining a urine sample and provided him with a clean urinal and a specimen cup. Read in MD noted that UDS was recommended to be repeated today pending a possible procedure. Day team staff, including the IV team, was not able to secure an intravenous line on patient yesterday after he had multiple lines to infiltrate. Will consult with MD about line placement options.   0614-sent FYI to Dr. Dwyane Dee via secure chat regarding staff not being able to obtain an IV line.

## 2023-02-28 NOTE — Progress Notes (Signed)
[facility] Progress Note  [name]    [id number]  DOA: 02/22/2023     Date of Service: the patient was seen and examined on 02/28/2023  Chief Complaint  Patient presents with   GI Problem   Rectal Bleeding   Brief hospital course: Ethan Wells is a 52 y.o. male with medical history significant of polysubstance abuse (alcohol, tobacco, cocaine, marijuana), GI bleeding, hypertension, hyperlipidemia, CAD, STEMI with BMS placement, CHF, depression with anxiety, adrenal insufficiency, cardiac arrest due to V-fib in the setting of STEMI, who presents with GI bleeding.    Patient was recently hospitalized from 2/1 - 2/16 due to GI bleeding related hemorrhagic shock. Pt states that he started having coffee-ground hematemesis again since yesterday.  He states that it totally he has had more than 20 times of hematemesis.  He also has black tarry stool.  Patient has nausea and abdominal pain.  His abdominal pain is diffuse, constant, aching, mild to moderate, nonradiating.  No fever or chills.  He has mild shortness breath and chest discomfort, no cough.  Denies symptoms of UTI.  No confusion.  He reports possible syncope but he is uncertain about this.  No unilateral numbness or tinglings or weakness extremities.   Patient was found to be hypotensive initially, with blood pressure 74/56, which improved to 92/53 after giving 3 L LR in ED.  ED w/up: Hb 10.7 on 01/29/2023 --> 7.5, troponin 46, WBC 11.6, INR 1.6, PTT 33, temperature normal, heart rate 100-120s, RR 30 --> 14, oxygen saturation 98% on room air.   EKG: Sinus rhythm, QTc 471, LAE, early R wave progression, frequent PVC.   Patient is admitted to stepdown as inpatient.  Dr. Marius Ditch of GI is consulted.   Assessment and Plan: Principal Problem:   GI bleeding Active Problems:   Acute blood loss anemia   Adrenal insufficiency (HCC)   CAD (coronary artery disease)   Myocardial injury   Hypomagnesemia   Chronic  systolic CHF (congestive heart failure) (HCC)   HLD (hyperlipidemia)   Thrombocytopenia (HCC)   Essential hypertension   Depression with anxiety   Polysubstance abuse ([place])   Morbid obesity ([place])   # Acute GI bleeding # Acute blood loss anemia Hb 7.1--8.1--7.4--7.8--8.4--8.1 S/p 2 unit of PRBC transfusion Anemia workup within normal range Continue IV ceftriaxone for SBP prophylaxis S/p IV fluid bolus, vital signs stable S/p pantoprazole IV infusion, switch to pantoprazole IV push and then pantoprazole 40 mg p.o. twice daily as patient lost IV on 3/14 S/p octreotide IV infusion DC'd on 3/14 Continue Zofran as needed Monitor H&H and transfuse if hemoglobin less than 7 GI consulted, EGD could not be done due to urine tox positive for cocaine. Continue full liquid diet 3/12 UDS positive for cocaine and benzo, GI postponed EGD for now. 3/13 UDS positive for cocaine, GI recommended to repeat UDS on Friday 3/15 UDS negative for cocaine, plan for EGD tomorrow a.m. Follow repeat UDS tomorrow a.m. Keep n.p.o. after midnight  # Liver cirrhosis due to alcohol abuse Continue rifaximin 550 mg p.o. twice daily for hepatic encephalopathy, need to continue long-term on discharge Continue lactulose Ammonia 44--56--47--50 Check ammonia level Follow GI, EGD to rule out esophageal varices and portal gastropathy 3/15 [place] abd Trace ascites, no safe window for paracentesis. paracentesis was not performed. Follow abdominal x-ray for abdominal distention   # EtOH abuse, withdrawal symptoms Continue CIWA protocol EtOH abstinence counseling done   # Hypokalemia, potassium repleted # Hypophosphatemia, Phos  repleted. # Hypomagnesemia, mag repleted. Monitor electrolytes and replete as needed  # Adrenal insufficiency (HCC) -Give Solu-Cortef 100 mg x 2 doses as stress doses -Continue home Cortef 20 mg daily  -Continue home midodrine 10 mg 3 times daily for hypotension  CAD (coronary artery  disease) and myocardial injury: Troponin 46.  History of STEMI with BMS placement -Will not give aspirin due to GI bleeding -Crestor, Zetia -Troponin 172--101 trended down -A1c 5.6,  Chronic systolic CHF (congestive heart failure) [facility]): Patient had history of systolic CHF.  2D echo on 07/13/2019 showed EF of 45-50%.  His EF has improved in recent years.  2D echo on 01/17/2023 showed EF of 50-55%.  Patient does not have leg edema or JVD.  CHF is compensatd -Hold diuretics due to hypotension -BNP 207    HLD (hyperlipidemia) -Crestor, Zetia Lipid profile LDL 62, triglyceride 53, within normal range  Thrombocytopenia ([place]): This is a chronic issue.  Platelet 103 on 11/21/2022, 158 on 01/29/2023.  platelet 108--50.  Likely due to alcohol abuse and possible liver cirrhosis. -Follow-up with CBC   Essential hypertension -Hold off blood pressure medications and diuretics due to hypotension -Hold Imdur nitroglycerin   Depression with anxiety -Seroquel   Polysubstance abuse: Including cocaine, tobacco, alcohol, marijuana -Did counseling about importance of quitting substance use -Nicotine patch -CIWA protocol -UDS positive for cocaine    Obesity, Body mass index is 31.04 kg/m.  Interventions: -Healthy diet and exercise -Encouraged losing weight      Diet: Soft diet  DVT Prophylaxis: SCD, pharmacological prophylaxis contraindicated due to GI bleeding    Advance goals of care discussion: Full code  Family Communication: family was NOT present at bedside, at the time of interview.  The pt provided permission to discuss medical plan with the family. Opportunity was given to ask question and all questions were answered satisfactorily.   Disposition:  Pt is from Home, admitted with GI bleeding, s/p IV PPI and octreotide, GI workup pending, which precludes a safe discharge. Discharge to Home, when stable and cleared by GI.  Subjective: No significant events overnight, patient was in the  restroom, he came out after some time and he said that he had a BM but no GI bleeding.  Patient was complaining of generalized body ache.  Denies any chest pain or palpitation, no any other active issues.   Physical Exam: General: NAD, lying comfortably Appear in no distress, affect appropriate Eyes: PERRLA ENT: Oral Mucosa Clear, moist  Neck: no JVD,  Cardiovascular: S1 and S2 Present, no Murmur,  Respiratory: good respiratory effort, Bilateral Air entry equal and Decreased, no Crackles, no wheezes Abdomen: Bowel Sound present, Soft, distended and mild tenderness,  Skin: no rashes Extremities: mild Pedal edema, no calf tenderness Neurologic: without any new focal findings Gait not checked due to patient safety concerns  Vitals:   02/27/23 1526 02/27/23 1937 02/28/23 0310 02/28/23 0735  BP: (!) 101/50 (!) 102/58 112/63 (!) 100/56  Pulse: 80 64 64 75  Resp: 18 18 18 14   Temp: 98 F (36.7 C) 98.4 F (36.9 C) 98.5 F (36.9 C) 98 F (36.7 C)  TempSrc: Oral  Oral Oral  SpO2: 100% 100% 100% 99%  Weight:      Height:        Intake/Output Summary (Last 24 hours) at 02/28/2023 1525 Last data filed at 02/28/2023 1431 Gross per 24 hour  Intake 600 ml  Output 1850 ml  Net -1250 ml   Filed Weights   02/22/23  [id number]  Weight: 84.6 kg    Data Reviewed: I have personally reviewed and interpreted daily labs, tele strips, imagings as discussed above. I reviewed all nursing notes, pharmacy notes, vitals, pertinent old records I have discussed plan of care as described above with RN and patient/family.  CBC: Recent Labs  Lab 02/22/23 0450 02/22/23 0918 02/24/23 0132 02/24/23 1702 02/25/23 0918 02/26/23 0519 02/27/23 0558 02/28/23 0445  WBC 11.6*   < > 5.4  --  3.9* 5.4 5.5 5.9  NEUTROABS 8.4*  --   --   --   --   --   --   --   HGB 7.5*   < > 7.4* 7.4* 7.8* 8.4* 8.1* 8.1*  HCT 23.7*   < > 22.6* 23.9* 24.9* 27.0* 25.7* 25.9*  MCV 90.8   < > 90.8  --  94.3 95.7 94.8 95.9  PLT  108*   < > 68*  --  57* 55* 58* 77*   < > = values in this interval not displayed.   Basic Metabolic Panel: Recent Labs  Lab 02/24/23 0132 02/25/23 0918 02/26/23 0519 02/27/23 0558 02/28/23 0445  NA 137 140 139 136 140  K 3.3* 3.0* 3.8 4.0 4.1  CL 112* 113* 114* 112* 110  CO2 21* 20* 21* 20* 20*  GLUCOSE 113* 135* 139* 112* 108*  BUN 14 6 <5* 5* 7  CREATININE 0.56* 0.56* 0.65 0.58* 0.57*  CALCIUM 7.2* 7.7* 7.9* 8.0* 8.5*  MG 2.0 1.9 1.9 1.8 1.8  PHOS 2.0* 2.3* 2.7 2.6 2.7    Studies: [place] ASCITES (ABDOMEN LIMITED)  Result Date: 02/28/2023 CLINICAL DATA:  52 year old male with history of ascites presenting for paracentesis. EXAM: LIMITED ABDOMEN ULTRASOUND FOR ASCITES TECHNIQUE: Limited ultrasound survey for ascites was performed in all four abdominal quadrants. COMPARISON:  None Available. FINDINGS: Trace perihepatic ascites. Trace ascites in the left lower quadrant no ascites in the right lower and left upper quadrants. IMPRESSION: Trace ascites, no safe window for paracentesis. No paracentesis was performed. Ruthann Cancer, MD [facility] Radiology Electronically Signed   By: Ruthann Cancer M.D.   On: 02/28/2023 15:15    Scheduled Meds:  ezetimibe  10 mg Oral Daily   ferrous sulfate  325 mg Oral Q breakfast   folic acid  1 mg Oral Daily   gabapentin  300 mg Oral TID   hydrocortisone  20 mg Oral Daily   lactulose  20 g Oral BID   midodrine  10 mg Oral TID WC   multivitamin with minerals  1 tablet Oral Daily   nicotine  21 mg Transdermal Daily   pantoprazole  40 mg Oral BID   QUEtiapine  25 mg Oral QHS   rifaximin  550 mg Oral BID   rosuvastatin  40 mg Oral Daily   sodium chloride flush  10-40 mL Intracatheter Q12H   thiamine  100 mg Oral Daily   Or   thiamine  100 mg Intravenous Daily   Continuous Infusions:  sodium chloride Stopped (02/23/23 1806)   sodium chloride Stopped (02/27/23 0900)   sodium chloride     lactated  ringers     PRN Meds: acetaminophen **OR** acetaminophen, methocarbamol, metoCLOPramide (REGLAN) injection, nitroGLYCERIN, mouth rinse, oxyCODONE, sodium chloride flush, traZODone  Time spent: 35 minutes  Author: Val Riles. MD [facility] 02/28/2023 3:25 PM  To reach On-call, see care teams to locate the attending and reach out to them via [email or web address]. If 7PM-7AM, please contact night-coverage  If you still have difficulty reaching the attending provider, please page the [facility] (Director on Call) for [facility] on amion for assistance.

## 2023-02-28 NOTE — Progress Notes (Signed)
Mobility Specialist - Progress Note   02/28/23 1114  Mobility  Activity Ambulated independently in room;Transferred from bed to chair  Level of Assistance Modified independent, requires aide device or extra time  Assistive Device Front wheel walker  Distance Ambulated (ft) 4 ft  Activity Response Tolerated well  $Mobility charge 1 Mobility   Pt supine upon entry, utilizing RA. Pt acceptable to OOB amb to the recliner to this date. Pt completed be mob, STS to RW and amb ModI; extra time required for BLE. Pt left sitting in recliner with needs within reach. RN notified.   Candie Mile Mobility Specialist 02/28/23 11:23 AM

## 2023-02-28 NOTE — Care Management Important Message (Signed)
Important Message  Patient Details  Name: Ethan Wells MRN: [id number] Date of Birth: 24-Jun-1971   Medicare Important Message Given:  Yes     [name] 02/28/2023, 11:37 AM

## 2023-02-28 NOTE — Progress Notes (Signed)
Cephas Darby, MD [address]  [place]  [place], [place] [address]  Main: [phone]  Fax: [phone] Pager: [phone]   Subjective: No acute events overnight.  Patient had 1 brown bowel movement today. Denies any nausea or vomiting or hematemesis or coffee-ground emesis.   Objective: Vital signs in last 24 hours: Vitals:   02/27/23 1526 02/27/23 1937 02/28/23 0310 02/28/23 0735  BP: (!) 101/50 (!) 102/58 112/63 (!) 100/56  Pulse: 80 64 64 75  Resp: 18 18 18 14   Temp: 98 F (36.7 C) 98.4 F (36.9 C) 98.5 F (36.9 C) 98 F (36.7 C)  TempSrc: Oral  Oral Oral  SpO2: 100% 100% 100% 99%  Weight:      Height:       Weight change:   Intake/Output Summary (Last 24 hours) at 02/28/2023 1018 Last data filed at 02/28/2023 0839 Gross per 24 hour  Intake 0 ml  Output 2450 ml  Net -2450 ml     Exam: Heart:: Regular rate and rhythm, S1S2 present, or without murmur or extra heart sounds Lungs: normal and clear to auscultation Abdomen: soft, nontender, normal bowel sounds   Lab Results:    Latest Ref Rng & Units 02/28/2023    4:45 AM 02/27/2023    5:58 AM 02/26/2023    5:19 AM  CBC  WBC 4.0 - 10.5 K/uL 5.9  5.5  5.4   Hemoglobin 13.0 - 17.0 g/dL 8.1  8.1  8.4   Hematocrit 39.0 - 52.0 % 25.9  25.7  27.0   Platelets 150 - 400 K/uL 77  58  55       Latest Ref Rng & Units 02/28/2023    4:45 AM 02/27/2023    5:58 AM 02/26/2023    5:19 AM  CMP  Glucose 70 - 99 mg/dL 108  112  139   BUN 6 - 20 mg/dL 7  5  <5   Creatinine 0.61 - 1.24 mg/dL 0.57  0.58  0.65   Sodium 135 - 145 mmol/L 140  136  139   Potassium 3.5 - 5.1 mmol/L 4.1  4.0  3.8   Chloride 98 - 111 mmol/L 110  112  114   CO2 22 - 32 mmol/L 20  20  21    Calcium 8.9 - 10.3 mg/dL 8.5  8.0  7.9   Total Protein 6.5 - 8.1 g/dL 5.7     Total Bilirubin 0.3 - 1.2 mg/dL 1.3     Alkaline Phos 38 - 126 U/L 129     AST 15 - 41 U/L 44     ALT 0 - 44 U/L 21       Micro Results: Recent Results (from the  past 240 hour(s))  MRSA Next Gen by PCR, Nasal     Status: None   Collection Time: 02/22/23  9:09 AM   Specimen: Nasal Mucosa; Nasal Swab  Result Value Ref Range Status   MRSA by PCR Next Gen NOT DETECTED NOT DETECTED Final    Comment: (NOTE) The GeneXpert MRSA Assay (FDA approved for NASAL specimens only), is one component of a comprehensive MRSA colonization surveillance program. It is not intended to diagnose MRSA infection nor to guide or monitor treatment for MRSA infections. Test performance is not FDA approved in patients less than 26 years old. Performed at [facility], [address], [place], [place] [address]    Studies/Results: No results found. Medications: I have reviewed the patient's current medications.  Prior to Admission:  Medications Prior to Admission  Medication Sig Dispense Refill Last Dose   folic acid (FOLVITE) 1 MG tablet Take 1 tablet (1 mg total) by mouth daily. 30 tablet 0 Past Week   gabapentin (NEURONTIN) 300 MG capsule Take 1 capsule (300 mg total) by mouth 3 (three) times daily. 90 capsule 4 Past Week   hydrocortisone (CORTEF) 10 MG tablet Take 1 tablet (10 mg total) by mouth daily. 30 tablet 0 Past Week   isosorbide mononitrate (IMDUR) 30 MG 24 hr tablet Take 15 mg by mouth daily.   Past Week   midodrine (PROAMATINE) 10 MG tablet Take 1 tablet (10 mg total) by mouth 3 (three) times daily with meals. 20 tablet 0 Past Week   pantoprazole (PROTONIX) 40 MG tablet TAKE 1 TABLET BY MOUTH EVERY DAY 90 tablet 1 Past Week   QUEtiapine (SEROQUEL) 25 MG tablet Take 1 tablet (25 mg total) by mouth at bedtime. 30 tablet 0 Past Week   rosuvastatin (CRESTOR) 40 MG tablet TAKE 1 TABLET BY MOUTH EVERY DAY 90 tablet 1 Past Week   torsemide (DEMADEX) 20 MG tablet Take 1 tablet (20 mg total) by mouth daily. 30 tablet 0 Past Week   acetaminophen (TYLENOL) 500 MG tablet Take 2 tablets (1,000 mg total) by mouth every 6 (six) hours as needed for mild pain. (Patient  not taking: Reported on 01/17/2023)   Not Taking   ezetimibe (ZETIA) 10 MG tablet Take 10 mg by mouth daily. (Patient not taking: Reported on 02/28/2023)   Not Taking   ferrous sulfate 325 (65 FE) MG tablet Take 1 tablet (325 mg total) by mouth daily with breakfast. (Patient not taking: Reported on 02/28/2023) 30 tablet 1 Not Taking   furosemide (LASIX) 40 MG tablet Take 40 mg by mouth daily. (Patient not taking: Reported on 02/28/2023)   Not Taking   hydrocortisone (CORTEF) 20 MG tablet Take 1 tablet (20 mg total) by mouth daily. (Patient not taking: Reported on 02/28/2023) 30 tablet 0 Not Taking   methocarbamol (ROBAXIN) 750 MG tablet TAKE 1 TABLET (750 MG TOTAL) BY MOUTH DAILY AS NEEDED FOR MUSCLE SPASMS 30 tablet 0 prn at prn   nitroGLYCERIN (NITROSTAT) 0.4 MG SL tablet Place 1 tablet (0.4 mg total) under the tongue every 5 (five) minutes as needed for chest pain. 30 tablet 12 prn at prn   oxyCODONE 10 MG TABS Take 1 tablet (10 mg total) by mouth every 8 (eight) hours as needed for severe pain. 10 tablet 0 prn at prn   thiamine (VITAMIN B-1) 100 MG tablet Take 1 tablet (100 mg total) by mouth daily. (Patient not taking: Reported on 02/28/2023) 30 tablet 0 Not Taking   Scheduled:  ezetimibe  10 mg Oral Daily   ferrous sulfate  325 mg Oral Q breakfast   folic acid  1 mg Oral Daily   gabapentin  300 mg Oral TID   hydrocortisone  20 mg Oral Daily   lactulose  20 g Oral BID   midodrine  10 mg Oral TID WC   multivitamin with minerals  1 tablet Oral Daily   nicotine  21 mg Transdermal Daily   pantoprazole  40 mg Oral BID   QUEtiapine  25 mg Oral QHS   rifaximin  550 mg Oral BID   rosuvastatin  40 mg Oral Daily   sodium chloride flush  10-40 mL Intracatheter Q12H   thiamine  100 mg Oral Daily   Or   thiamine  100 mg Intravenous Daily   Continuous:  sodium chloride Stopped (02/23/23 1806)   sodium chloride Stopped (02/27/23 0900)   lactated ringers     [id number] **OR** acetaminophen,  methocarbamol, metoCLOPramide (REGLAN) injection, nitroGLYCERIN, mouth rinse, oxyCODONE, sodium chloride flush, traZODone Anti-infectives (From admission, onward)    Start     Dose/Rate Route Frequency Ordered Stop   02/23/23 0400  cefTRIAXone (ROCEPHIN) 2 g in sodium chloride 0.9 % 100 mL IVPB  Status:  Discontinued        2 g 200 mL/hr over 30 Minutes Intravenous Every 24 hours 02/22/23 0749 02/27/23 1050   02/22/23 1330  rifaximin (XIFAXAN) tablet 550 mg        550 mg Oral 2 times daily 02/22/23 1241     02/22/23 0430  cefTRIAXone (ROCEPHIN) 2 g in sodium chloride 0.9 % 100 mL IVPB        2 g 200 mL/hr over 30 Minutes Intravenous  Once 02/22/23 0429 02/22/23 0515      Scheduled Meds:  ezetimibe  10 mg Oral Daily   ferrous sulfate  325 mg Oral Q breakfast   folic acid  1 mg Oral Daily   gabapentin  300 mg Oral TID   hydrocortisone  20 mg Oral Daily   lactulose  20 g Oral BID   midodrine  10 mg Oral TID WC   multivitamin with minerals  1 tablet Oral Daily   nicotine  21 mg Transdermal Daily   pantoprazole  40 mg Oral BID   QUEtiapine  25 mg Oral QHS   rifaximin  550 mg Oral BID   rosuvastatin  40 mg Oral Daily   sodium chloride flush  10-40 mL Intracatheter Q12H   thiamine  100 mg Oral Daily   Or   thiamine  100 mg Intravenous Daily   Continuous Infusions:  sodium chloride Stopped (02/23/23 1806)   sodium chloride Stopped (02/27/23 0900)   lactated ringers     PRN Meds:.acetaminophen **OR** acetaminophen, methocarbamol, metoCLOPramide (REGLAN) injection, nitroGLYCERIN, mouth rinse, oxyCODONE, sodium chloride flush, traZODone   Assessment: Principal Problem:   GI bleeding Active Problems:   Essential hypertension   CAD (coronary artery disease)   Polysubstance abuse (HCC)   Hypomagnesemia   Chronic systolic CHF (congestive heart failure) ([place])   Depression with anxiety   Acute blood loss anemia   Morbid obesity ([place])   Myocardial injury   HLD (hyperlipidemia)    Thrombocytopenia (HCC)   Adrenal insufficiency ([place])   Alcoholic liver disease ([place])   Upper GI bleed  SHRITAN BLATCHFORD is a 52 y.o. male with alcohol abuse, possible cirrhosis of liver, hypertension, hyperlipidemia, history of coronary artery disease, STEMI and V-fib arrest, history of erosive esophagitis, history of hemorrhoidectomy is admitted with hematemesis and melena with acute blood loss anemia with ongoing alcohol use and polysubstance abuse, cocaine positive during this admission  Plan: Hematemesis and melena with acute blood loss anemia: Symptoms have currently resolved since admission Hemoglobin is stable within last 24 hours Continue pantoprazole drip, octreotide drip Continue ceftriaxone for SBP prophylaxis Okay to start full liquid diet Monitor CBC closely to maintain hemoglobin above 8 Maintain platelets above 50 Could not perform EGD earlier due to cocaine positive on U tox and patient not actively bleeding.  Will plan to perform EGD tomorrow by Dr. Virgina Jock, urine toxicology screen came back negative today N.p.o. effective midnight   ?  Cirrhosis of liver, alcohol dependence EGD as above to rule out any esophageal  varices Continue rifaximin 550 mg twice daily for hepatic encephalopathy, continue long-term upon discharge No indication for prednisone for acute alcoholic hepatitis given low discriminant function Does not appear volume overloaded, hold diuretics No evidence of portal vein thrombosis based on CT angio on 01/17/23 Patient will need long-term follow-up for management of chronic alcoholic liver disease Will benefit from inpatient alcohol rehab program     LOS: 6 days   Ethan Wells 02/28/2023, 10:18 AM

## 2023-02-28 NOTE — H&P (View-Only) (Signed)
Ethan Darby, MD [address]  [place]  [place], [place] [address]  Main: [phone]  Fax: [phone] Pager: [phone]   Subjective: No acute events overnight.  Patient had 1 brown bowel movement today. Denies any nausea or vomiting or hematemesis or coffee-ground emesis.   Objective: Vital signs in last 24 hours: Vitals:   02/27/23 1526 02/27/23 1937 02/28/23 0310 02/28/23 0735  BP: (!) 101/50 (!) 102/58 112/63 (!) 100/56  Pulse: 80 64 64 75  Resp: 18 18 18 14   Temp: 98 F (36.7 C) 98.4 F (36.9 C) 98.5 F (36.9 C) 98 F (36.7 C)  TempSrc: Oral  Oral Oral  SpO2: 100% 100% 100% 99%  Weight:      Height:       Weight change:   Intake/Output Summary (Last 24 hours) at 02/28/2023 1018 Last data filed at 02/28/2023 0839 Gross per 24 hour  Intake 0 ml  Output 2450 ml  Net -2450 ml     Exam: Heart:: Regular rate and rhythm, S1S2 present, or without murmur or extra heart sounds Lungs: normal and clear to auscultation Abdomen: soft, nontender, normal bowel sounds   Lab Results:    Latest Ref Rng & Units 02/28/2023    4:45 AM 02/27/2023    5:58 AM 02/26/2023    5:19 AM  CBC  WBC 4.0 - 10.5 K/uL 5.9  5.5  5.4   Hemoglobin 13.0 - 17.0 g/dL 8.1  8.1  8.4   Hematocrit 39.0 - 52.0 % 25.9  25.7  27.0   Platelets 150 - 400 K/uL 77  58  55       Latest Ref Rng & Units 02/28/2023    4:45 AM 02/27/2023    5:58 AM 02/26/2023    5:19 AM  CMP  Glucose 70 - 99 mg/dL 108  112  139   BUN 6 - 20 mg/dL 7  5  <5   Creatinine 0.61 - 1.24 mg/dL 0.57  0.58  0.65   Sodium 135 - 145 mmol/L 140  136  139   Potassium 3.5 - 5.1 mmol/L 4.1  4.0  3.8   Chloride 98 - 111 mmol/L 110  112  114   CO2 22 - 32 mmol/L 20  20  21    Calcium 8.9 - 10.3 mg/dL 8.5  8.0  7.9   Total Protein 6.5 - 8.1 g/dL 5.7     Total Bilirubin 0.3 - 1.2 mg/dL 1.3     Alkaline Phos 38 - 126 U/L 129     AST 15 - 41 U/L 44     ALT 0 - 44 U/L 21       Micro Results: Recent Results (from the  past 240 hour(s))  MRSA Next Gen by PCR, Nasal     Status: None   Collection Time: 02/22/23  9:09 AM   Specimen: Nasal Mucosa; Nasal Swab  Result Value Ref Range Status   MRSA by PCR Next Gen NOT DETECTED NOT DETECTED Final    Comment: (NOTE) The GeneXpert MRSA Assay (FDA approved for NASAL specimens only), is one component of a comprehensive MRSA colonization surveillance program. It is not intended to diagnose MRSA infection nor to guide or monitor treatment for MRSA infections. Test performance is not FDA approved in patients less than 73 years old. Performed at [facility], [address], [place], [place] [address]    Studies/Results: No results found. Medications: I have reviewed the patient's current medications.  Prior to Admission:  Medications Prior to Admission  Medication Sig Dispense Refill Last Dose   folic acid (FOLVITE) 1 MG tablet Take 1 tablet (1 mg total) by mouth daily. 30 tablet 0 Past Week   gabapentin (NEURONTIN) 300 MG capsule Take 1 capsule (300 mg total) by mouth 3 (three) times daily. 90 capsule 4 Past Week   hydrocortisone (CORTEF) 10 MG tablet Take 1 tablet (10 mg total) by mouth daily. 30 tablet 0 Past Week   isosorbide mononitrate (IMDUR) 30 MG 24 hr tablet Take 15 mg by mouth daily.   Past Week   midodrine (PROAMATINE) 10 MG tablet Take 1 tablet (10 mg total) by mouth 3 (three) times daily with meals. 20 tablet 0 Past Week   pantoprazole (PROTONIX) 40 MG tablet TAKE 1 TABLET BY MOUTH EVERY DAY 90 tablet 1 Past Week   QUEtiapine (SEROQUEL) 25 MG tablet Take 1 tablet (25 mg total) by mouth at bedtime. 30 tablet 0 Past Week   rosuvastatin (CRESTOR) 40 MG tablet TAKE 1 TABLET BY MOUTH EVERY DAY 90 tablet 1 Past Week   torsemide (DEMADEX) 20 MG tablet Take 1 tablet (20 mg total) by mouth daily. 30 tablet 0 Past Week   acetaminophen (TYLENOL) 500 MG tablet Take 2 tablets (1,000 mg total) by mouth every 6 (six) hours as needed for mild pain. (Patient  not taking: Reported on 01/17/2023)   Not Taking   ezetimibe (ZETIA) 10 MG tablet Take 10 mg by mouth daily. (Patient not taking: Reported on 02/28/2023)   Not Taking   ferrous sulfate 325 (65 FE) MG tablet Take 1 tablet (325 mg total) by mouth daily with breakfast. (Patient not taking: Reported on 02/28/2023) 30 tablet 1 Not Taking   furosemide (LASIX) 40 MG tablet Take 40 mg by mouth daily. (Patient not taking: Reported on 02/28/2023)   Not Taking   hydrocortisone (CORTEF) 20 MG tablet Take 1 tablet (20 mg total) by mouth daily. (Patient not taking: Reported on 02/28/2023) 30 tablet 0 Not Taking   methocarbamol (ROBAXIN) 750 MG tablet TAKE 1 TABLET (750 MG TOTAL) BY MOUTH DAILY AS NEEDED FOR MUSCLE SPASMS 30 tablet 0 prn at prn   nitroGLYCERIN (NITROSTAT) 0.4 MG SL tablet Place 1 tablet (0.4 mg total) under the tongue every 5 (five) minutes as needed for chest pain. 30 tablet 12 prn at prn   oxyCODONE 10 MG TABS Take 1 tablet (10 mg total) by mouth every 8 (eight) hours as needed for severe pain. 10 tablet 0 prn at prn   thiamine (VITAMIN B-1) 100 MG tablet Take 1 tablet (100 mg total) by mouth daily. (Patient not taking: Reported on 02/28/2023) 30 tablet 0 Not Taking   Scheduled:  ezetimibe  10 mg Oral Daily   ferrous sulfate  325 mg Oral Q breakfast   folic acid  1 mg Oral Daily   gabapentin  300 mg Oral TID   hydrocortisone  20 mg Oral Daily   lactulose  20 g Oral BID   midodrine  10 mg Oral TID WC   multivitamin with minerals  1 tablet Oral Daily   nicotine  21 mg Transdermal Daily   pantoprazole  40 mg Oral BID   QUEtiapine  25 mg Oral QHS   rifaximin  550 mg Oral BID   rosuvastatin  40 mg Oral Daily   sodium chloride flush  10-40 mL Intracatheter Q12H   thiamine  100 mg Oral Daily   Or   thiamine  100 mg Intravenous Daily   Continuous:  sodium chloride Stopped (02/23/23 1806)   sodium chloride Stopped (02/27/23 0900)   lactated ringers     [id number] **OR** acetaminophen,  methocarbamol, metoCLOPramide (REGLAN) injection, nitroGLYCERIN, mouth rinse, oxyCODONE, sodium chloride flush, traZODone Anti-infectives (From admission, onward)    Start     Dose/Rate Route Frequency Ordered Stop   02/23/23 0400  cefTRIAXone (ROCEPHIN) 2 g in sodium chloride 0.9 % 100 mL IVPB  Status:  Discontinued        2 g 200 mL/hr over 30 Minutes Intravenous Every 24 hours 02/22/23 0749 02/27/23 1050   02/22/23 1330  rifaximin (XIFAXAN) tablet 550 mg        550 mg Oral 2 times daily 02/22/23 1241     02/22/23 0430  cefTRIAXone (ROCEPHIN) 2 g in sodium chloride 0.9 % 100 mL IVPB        2 g 200 mL/hr over 30 Minutes Intravenous  Once 02/22/23 0429 02/22/23 0515      Scheduled Meds:  ezetimibe  10 mg Oral Daily   ferrous sulfate  325 mg Oral Q breakfast   folic acid  1 mg Oral Daily   gabapentin  300 mg Oral TID   hydrocortisone  20 mg Oral Daily   lactulose  20 g Oral BID   midodrine  10 mg Oral TID WC   multivitamin with minerals  1 tablet Oral Daily   nicotine  21 mg Transdermal Daily   pantoprazole  40 mg Oral BID   QUEtiapine  25 mg Oral QHS   rifaximin  550 mg Oral BID   rosuvastatin  40 mg Oral Daily   sodium chloride flush  10-40 mL Intracatheter Q12H   thiamine  100 mg Oral Daily   Or   thiamine  100 mg Intravenous Daily   Continuous Infusions:  sodium chloride Stopped (02/23/23 1806)   sodium chloride Stopped (02/27/23 0900)   lactated ringers     PRN Meds:.acetaminophen **OR** acetaminophen, methocarbamol, metoCLOPramide (REGLAN) injection, nitroGLYCERIN, mouth rinse, oxyCODONE, sodium chloride flush, traZODone   Assessment: Principal Problem:   GI bleeding Active Problems:   Essential hypertension   CAD (coronary artery disease)   Polysubstance abuse (HCC)   Hypomagnesemia   Chronic systolic CHF (congestive heart failure) ([place])   Depression with anxiety   Acute blood loss anemia   Morbid obesity ([place])   Myocardial injury   HLD (hyperlipidemia)    Thrombocytopenia (HCC)   Adrenal insufficiency ([place])   Alcoholic liver disease ([place])   Upper GI bleed  Ethan Wells is a 52 y.o. male with alcohol abuse, possible cirrhosis of liver, hypertension, hyperlipidemia, history of coronary artery disease, STEMI and V-fib arrest, history of erosive esophagitis, history of hemorrhoidectomy is admitted with hematemesis and melena with acute blood loss anemia with ongoing alcohol use and polysubstance abuse, cocaine positive during this admission  Plan: Hematemesis and melena with acute blood loss anemia: Symptoms have currently resolved since admission Hemoglobin is stable within last 24 hours Continue pantoprazole drip, octreotide drip Continue ceftriaxone for SBP prophylaxis Okay to start full liquid diet Monitor CBC closely to maintain hemoglobin above 8 Maintain platelets above 50 Could not perform EGD earlier due to cocaine positive on U tox and patient not actively bleeding.  Will plan to perform EGD tomorrow by Dr. Virgina Jock, urine toxicology screen came back negative today N.p.o. effective midnight   ?  Cirrhosis of liver, alcohol dependence EGD as above to rule out any esophageal  varices Continue rifaximin 550 mg twice daily for hepatic encephalopathy, continue long-term upon discharge No indication for prednisone for acute alcoholic hepatitis given low discriminant function Does not appear volume overloaded, hold diuretics No evidence of portal vein thrombosis based on CT angio on 01/17/23 Patient will need long-term follow-up for management of chronic alcoholic liver disease Will benefit from inpatient alcohol rehab program     LOS: 6 days   Ethan Wells 02/28/2023, 10:18 AM

## 2023-03-01 ENCOUNTER — Inpatient Hospital Stay: Payer: Medicare HMO | Admitting: Anesthesiology

## 2023-03-01 ENCOUNTER — Encounter
Admission: EM | Disposition: A | Discharge status: Home Health | Payer: Self-pay | Source: Home / Self Care | Attending: Student

## 2023-03-01 DIAGNOSIS — I25118 Atherosclerotic heart disease of native coronary artery with other forms of angina pectoris: Secondary | ICD-10-CM

## 2023-03-01 DIAGNOSIS — K709 Alcoholic liver disease, unspecified: Secondary | ICD-10-CM | POA: Diagnosis not present

## 2023-03-01 DIAGNOSIS — K922 Gastrointestinal hemorrhage, unspecified: Secondary | ICD-10-CM | POA: Diagnosis not present

## 2023-03-01 DIAGNOSIS — F1910 Other psychoactive substance abuse, uncomplicated: Secondary | ICD-10-CM | POA: Diagnosis not present

## 2023-03-01 DIAGNOSIS — R600 Localized edema: Secondary | ICD-10-CM

## 2023-03-01 HISTORY — PX: ESOPHAGOGASTRODUODENOSCOPY (EGD) WITH PROPOFOL: SHX5813

## 2023-03-01 LAB — BASIC METABOLIC PANEL
Anion gap: 5 (ref 5–15)
BUN: 6 mg/dL (ref 6–20)
CO2: 24 mmol/L (ref 22–32)
Calcium: 8.3 mg/dL — ABNORMAL LOW (ref 8.9–10.3)
Chloride: 110 mmol/L (ref 98–111)
Creatinine, Ser: 0.56 mg/dL — ABNORMAL LOW (ref 0.61–1.24)
GFR, Estimated: >60 mL/min (ref >60)
Glucose, Bld: 111 mg/dL — ABNORMAL HIGH (ref 70–99)
Potassium: 3.9 mmol/L (ref 3.5–5.1)
Sodium: 139 mmol/L (ref 135–145)

## 2023-03-01 LAB — URINE DRUG SCREEN, QUALITATIVE (ARMC ONLY)
Amphetamines, Ur Screen: NOT DETECTED
Barbiturates, Ur Screen: NOT DETECTED
Benzodiazepine, Ur Scrn: NOT DETECTED
Cannabinoid 50 Ng, Ur ~~LOC~~: NOT DETECTED
Cocaine Metabolite,Ur ~~LOC~~: NOT DETECTED
MDMA (Ecstasy)Ur Screen: NOT DETECTED
Methadone Scn, Ur: NOT DETECTED
Opiate, Ur Screen: NOT DETECTED
Phencyclidine (PCP) Ur S: NOT DETECTED
Tricyclic, Ur Screen: NOT DETECTED

## 2023-03-01 LAB — MAGNESIUM: Magnesium: 1.7 mg/dL (ref 1.7–2.4)

## 2023-03-01 LAB — CBC
HCT: 25.3 % — ABNORMAL LOW (ref 39.0–52.0)
Hemoglobin: 7.9 g/dL — ABNORMAL LOW (ref 13.0–17.0)
MCH: 29.8 pg (ref 26.0–34.0)
MCHC: 31.2 g/dL (ref 30.0–36.0)
MCV: 95.5 fL (ref 80.0–100.0)
Platelets: 82 10*3/uL — ABNORMAL LOW (ref 150–400)
RBC: 2.65 MIL/uL — ABNORMAL LOW (ref 4.22–5.81)
RDW: 21.2 % — ABNORMAL HIGH (ref 11.5–15.5)
WBC: 5.3 10*3/uL (ref 4.0–10.5)
nRBC: 0 % (ref 0.0–0.2)

## 2023-03-01 LAB — HEPATIC FUNCTION PANEL
ALT: 26 U/L (ref 0–44)
AST: 61 U/L — ABNORMAL HIGH (ref 15–41)
Albumin: 2.5 g/dL — ABNORMAL LOW (ref 3.5–5.0)
Alkaline Phosphatase: 134 U/L — ABNORMAL HIGH (ref 38–126)
Bilirubin, Direct: 0.4 mg/dL — ABNORMAL HIGH (ref 0.0–0.2)
Indirect Bilirubin: 0.7 mg/dL (ref 0.3–0.9)
Total Bilirubin: 1.1 mg/dL (ref 0.3–1.2)
Total Protein: 5.5 g/dL — ABNORMAL LOW (ref 6.5–8.1)

## 2023-03-01 LAB — TROPONIN I (HIGH SENSITIVITY): Troponin I (High Sensitivity): 23 ng/L — ABNORMAL HIGH (ref <18)

## 2023-03-01 LAB — PHOSPHORUS: Phosphorus: 3.1 mg/dL (ref 2.5–4.6)

## 2023-03-01 LAB — PROTIME-INR
INR: 1.4 — ABNORMAL HIGH (ref 0.8–1.2)
Prothrombin Time: 16.5 seconds — ABNORMAL HIGH (ref 11.4–15.2)

## 2023-03-01 SURGERY — ESOPHAGOGASTRODUODENOSCOPY (EGD) WITH PROPOFOL
Anesthesia: General

## 2023-03-01 MED ORDER — PROPOFOL 10 MG/ML IV BOLUS
INTRAVENOUS | Status: DC | PRN
  Administered 2023-03-01: 100 mg via INTRAVENOUS
  Administered 2023-03-01: 50 mg via INTRAVENOUS

## 2023-03-01 MED ORDER — SODIUM CHLORIDE 0.9 % IV SOLN
50.0000 ug/h | INTRAVENOUS | Status: DC
  Administered 2023-03-01 – 2023-03-04 (×6): 50 ug/h via INTRAVENOUS
  Filled 2023-03-01 (×13): qty 1

## 2023-03-01 MED ORDER — LIDOCAINE VISCOUS HCL 2 % MT SOLN: 15.0000 mL | OROMUCOSAL | Status: DC | PRN

## 2023-03-01 MED ORDER — HYDROMORPHONE HCL 1 MG/ML IJ SOLN
INTRAMUSCULAR | Status: AC
  Administered 2023-03-01: 0.5 mg via INTRAVENOUS
  Filled 2023-03-01: qty 1

## 2023-03-01 MED ORDER — SODIUM CHLORIDE 0.9 % IV SOLN
2.0000 g | INTRAVENOUS | Status: DC
  Administered 2023-03-01: 2 g via INTRAVENOUS
  Filled 2023-03-01 (×2): qty 20

## 2023-03-01 MED ORDER — LIDOCAINE HCL (PF) 2 % IJ SOLN
INTRAMUSCULAR | Status: AC
  Filled 2023-03-01: qty 5

## 2023-03-01 MED ORDER — ONDANSETRON HCL 4 MG/2ML IJ SOLN
INTRAMUSCULAR | Status: DC | PRN
  Administered 2023-03-01: 4 mg via INTRAVENOUS

## 2023-03-01 MED ORDER — FENTANYL CITRATE (PF) 100 MCG/2ML IJ SOLN
INTRAMUSCULAR | Status: AC
  Administered 2023-03-01: 50 ug
  Filled 2023-03-01: qty 2

## 2023-03-01 MED ORDER — LIDOCAINE HCL (CARDIAC) PF 100 MG/5ML IV SOSY
PREFILLED_SYRINGE | INTRAVENOUS | Status: DC | PRN
  Administered 2023-03-01: 40 mg via INTRAVENOUS

## 2023-03-01 MED ORDER — CARVEDILOL 6.25 MG PO TABS: 3.1250 mg | ORAL_TABLET | Freq: Every day | ORAL | Status: DC

## 2023-03-01 MED ORDER — FUROSEMIDE 10 MG/ML IJ SOLN
4.0000 mg/h | INTRAVENOUS | Status: DC
  Administered 2023-03-01 – 2023-03-03 (×2): 4 mg/h via INTRAVENOUS
  Filled 2023-03-01 (×2): qty 20

## 2023-03-01 MED ORDER — HYDROMORPHONE HCL 1 MG/ML IJ SOLN: 0.5000 mg | INTRAMUSCULAR | Status: DC | PRN

## 2023-03-01 MED ORDER — OCTREOTIDE LOAD VIA INFUSION
50.0000 ug | Freq: Once | INTRAVENOUS | Status: DC
  Filled 2023-03-01: qty 25

## 2023-03-01 MED ORDER — DEXAMETHASONE SODIUM PHOSPHATE 10 MG/ML IJ SOLN
INTRAMUSCULAR | Status: DC | PRN
  Administered 2023-03-01: 10 mg via INTRAVENOUS

## 2023-03-01 MED ORDER — SUCCINYLCHOLINE CHLORIDE 200 MG/10ML IV SOSY
PREFILLED_SYRINGE | INTRAVENOUS | Status: DC | PRN
  Administered 2023-03-01: 100 mg via INTRAVENOUS

## 2023-03-01 MED ORDER — PROPOFOL 1000 MG/100ML IV EMUL
INTRAVENOUS | Status: AC
  Filled 2023-03-01: qty 100

## 2023-03-01 MED ORDER — FENTANYL CITRATE PF 50 MCG/ML IJ SOSY
100.0000 ug | PREFILLED_SYRINGE | Freq: Once | INTRAMUSCULAR | Status: AC
  Administered 2023-03-01: 100 ug via INTRAVENOUS

## 2023-03-01 MED ORDER — METOPROLOL TARTRATE 5 MG/5ML IV SOLN
INTRAVENOUS | Status: DC | PRN
  Administered 2023-03-01: 3 mg via INTRAVENOUS

## 2023-03-01 MED ORDER — PROPOFOL 500 MG/50ML IV EMUL
INTRAVENOUS | Status: DC | PRN
  Administered 2023-03-01: 150 ug/kg/min via INTRAVENOUS

## 2023-03-01 NOTE — Anesthesia Postprocedure Evaluation (Signed)
Anesthesia Post Note  [name]  Procedure(s) Performed: ESOPHAGOGASTRODUODENOSCOPY (EGD) WITH PROPOFOL  Patient location during evaluation: PACU Anesthesia Type: General Level of consciousness: awake and alert Pain management: pain level controlled Vital Signs Assessment: post-procedure vital signs reviewed and stable Respiratory status: spontaneous breathing, nonlabored ventilation, respiratory function stable and patient connected to nasal cannula oxygen Cardiovascular status: blood pressure returned to baseline and stable Postop Assessment: no apparent nausea or vomiting Anesthetic complications: no Comments: Troponins lower than admission labs, improving chest pain. Likely esophageal banding pain. VSS   No notable events documented.   Last Vitals:  Vitals:   03/01/23 1001 03/01/23 1030  BP: 117/62 113/65  Pulse: 79 79  Resp: 16 16  Temp: 36.9 C 37.2 C  SpO2: 96% 97%    Last Pain:  Vitals:   03/01/23 1030  TempSrc: Oral  PainSc:                  Arita Miss

## 2023-03-01 NOTE — Progress Notes (Signed)
[facility] Progress Note  [name]    [id number]  DOA: 02/22/2023     Date of Service: the patient was seen and examined on 03/01/2023  Chief Complaint  Patient presents with   GI Problem   Rectal Bleeding   Brief hospital course: Ethan Wells is a 52 y.o. male with medical history significant of polysubstance abuse (alcohol, tobacco, cocaine, marijuana), GI bleeding, hypertension, hyperlipidemia, CAD, STEMI with BMS placement, CHF, depression with anxiety, adrenal insufficiency, cardiac arrest due to V-fib in the setting of STEMI, who presents with GI bleeding.    Patient was recently hospitalized from 2/1 - 2/16 due to GI bleeding related hemorrhagic shock. Pt states that he started having coffee-ground hematemesis again since yesterday.  He states that it totally he has had more than 20 times of hematemesis.  He also has black tarry stool.  Patient has nausea and abdominal pain.  His abdominal pain is diffuse, constant, aching, mild to moderate, nonradiating.  No fever or chills.  He has mild shortness breath and chest discomfort, no cough.  Denies symptoms of UTI.  No confusion.  He reports possible syncope but he is uncertain about this.  No unilateral numbness or tinglings or weakness extremities.   Patient was found to be hypotensive initially, with blood pressure 74/56, which improved to 92/53 after giving 3 L LR in ED.  ED w/up: Hb 10.7 on 01/29/2023 --> 7.5, troponin 46, WBC 11.6, INR 1.6, PTT 33, temperature normal, heart rate 100-120s, RR 30 --> 14, oxygen saturation 98% on room air.   EKG: Sinus rhythm, QTc 471, LAE, early R wave progression, frequent PVC.   Patient is admitted to stepdown as inpatient.  Dr. Marius Ditch of GI is consulted.   Assessment and Plan: Principal Problem:   GI bleeding Active Problems:   Acute blood loss anemia   Adrenal insufficiency (HCC)   CAD (coronary artery disease)   Myocardial injury   Hypomagnesemia   Chronic  systolic CHF (congestive heart failure) (HCC)   HLD (hyperlipidemia)   Thrombocytopenia (HCC)   Essential hypertension   Depression with anxiety   Polysubstance abuse ([place])   Morbid obesity ([place])   # Acute GI bleeding # Acute blood loss anemia Hb 7.1--8.1--7.4--7.8--8.4--8.1 S/p 2 unit of PRBC transfusion Anemia workup within normal range Continue IV ceftriaxone for SBP prophylaxis S/p IV fluid bolus, vital signs stable S/p pantoprazole IV infusion, switch to pantoprazole IV push and then pantoprazole 40 mg p.o. twice daily as patient lost IV on 3/14 S/p octreotide IV infusion DC'd on 3/14 Continue Zofran as needed Monitor H&H and transfuse if hemoglobin less than 7 GI consulted, EGD could not be done due to urine tox positive for cocaine. 3/12 UDS positive for cocaine and benzo, GI postponed EGD for now. 3/13 UDS positive for cocaine, GI recommended to repeat UDS on Friday 3/15 UDS negative for cocaine, plan for EGD tomorrow a.m. 3/16 s/p EGD, esophageal varices, banding done by GI.  Recommended not a candidate for beta-blocker due to cocaine abuse and patient will benefit from TIPS procedure due to recurrent variceal bleeding. Will discuss with patient and transferred to [name] if patient agrees for TIPS procedure. Octreotide bolus was given and started on infusion for 72 hours, patient was started on ceftriaxone for 7 days, can transition to Cipro 500 mg BID at discharge  # Liver cirrhosis due to alcohol abuse Continue rifaximin 550 mg p.o. twice daily for hepatic encephalopathy, need to continue long-term on  discharge Continue lactulose Ammonia 44--56--47--50 3/15 [place] abd Trace ascites, no safe window for paracentesis. paracentesis was not performed. 3/15 KUB No evidence of bowel obstruction.  S/p GI as above, GI recommended TIPS procedure by IR, patient needs to be transferred to [facility] next week.  # EtOH abuse, withdrawal symptoms Continue CIWA protocol EtOH  abstinence counseling done   # Hypokalemia, potassium repleted # Hypophosphatemia, Phos repleted. # Hypomagnesemia, mag repleted. Monitor electrolytes and replete as needed  # Adrenal insufficiency (HCC) -Give Solu-Cortef 100 mg x 2 doses as stress doses -Continue home Cortef 20 mg daily  -Continue home midodrine 10 mg 3 times daily for hypotension  CAD (coronary artery disease) and myocardial injury: Troponin 46.  History of STEMI with BMS placement. -Will not give aspirin due to GI bleeding.  Continue Crestor and Zetia Beta-blocker was previously held in the setting of chronic cocaine use -Troponin 172--101 trended down -A1c 5.6, 3/16 discussed with cardiology, as patient had chest pain after endoscopy and variceal banding.  Chronic systolic CHF (congestive heart failure) [facility]): Patient had history of systolic CHF.  2D echo on 07/13/2019 showed EF of 45-50%.  His EF has improved in recent years.  2D echo on 01/17/2023 showed EF of 50-55%. Held diuretics on admission due to hypotension. -BNP 207 3/16 developed lower extremity edema could be due to liver cirrhosis, hypoalbuminemia and anemia.  Started Lasix IV infusion due to anasarca    HLD (hyperlipidemia) -Crestor, Zetia Lipid profile LDL 62, triglyceride 53, within normal range  Thrombocytopenia ([employer]): This is a chronic issue.  Platelet 103 on 11/21/2022, 158 on 01/29/2023.  platelet 108--50.  Likely due to alcohol abuse and possible liver cirrhosis. -Follow-up with CBC   Essential hypertension -Hold off blood pressure medications and diuretics due to hypotension -Hold Imdur nitroglycerin   Depression with anxiety -Seroquel   Polysubstance abuse: Including cocaine, tobacco, alcohol, marijuana -Did counseling about importance of quitting substance use -Nicotine patch -CIWA protocol -UDS positive for cocaine    Obesity, Body mass index is 31.04 kg/m.  Interventions: -Healthy diet and exercise -Encouraged losing weight       Diet: Clear liquid diet DVT Prophylaxis: SCD, pharmacological prophylaxis contraindicated due to GI bleeding    Advance goals of care discussion: Full code  Family Communication: family was NOT present at bedside, at the time of interview.  The pt provided permission to discuss medical plan with the family. Opportunity was given to ask question and all questions were answered satisfactorily.   Disposition:  Pt is from Home, admitted with GI bleeding, s/p IV PPI and octreotide, s/p EGD/banding, started IV octreotide for 72 hours, GI recommended TIPS procedure, which precludes a safe discharge. Discharge plan: Transfer to [facility] for TIPS procedure once patient will be scheduled by IR.    Subjective: No significant events overnight, patient was seen after EGD, he was complaining of lower chest and epigastric area pain most likely due to variceal banding and musculoskeletal pain, troponin negative.  Patient was complaining of feeling edematous, thigh swelling, agreed for Lasix IV infusion. Denies any shortness of breath or palpitations.   Physical Exam: General: NAD, lying comfortably Appear in no distress, affect appropriate Eyes: PERRLA ENT: Oral Mucosa Clear, moist  Neck: no JVD,  Cardiovascular: S1 and S2 Present, no Murmur,  Respiratory: good respiratory effort, Bilateral Air entry equal and Decreased, no Crackles, no wheezes Abdomen: Bowel Sound present, Soft, distended and mild tenderness,  Skin: no rashes Extremities: 3-4+ Pedal edema, no calf  tenderness Neurologic: without any new focal findings Gait not checked due to patient safety concerns  Vitals:   03/01/23 0915 03/01/23 0930 03/01/23 1001 03/01/23 1030  BP: 124/74 127/68 117/62 113/65  Pulse: 82 77 79 79  Resp: 18 12 16 16   Temp: 97.8 F (36.6 C)  98.5 F (36.9 C) 98.9 F (37.2 C)  TempSrc:   Oral Oral  SpO2: 99% 96% 96% 97%  Weight:      Height:        Intake/Output Summary (Last 24 hours) at 03/01/2023  1448 Last data filed at 03/01/2023 1443 Gross per 24 hour  Intake 360 ml  Output 2100 ml  Net -1740 ml   Filed Weights   02/22/23 0905  Weight: 84.6 kg    Data Reviewed: I have personally reviewed and interpreted daily labs, tele strips, imagings as discussed above. I reviewed all nursing notes, pharmacy notes, vitals, pertinent old records I have discussed plan of care as described above with RN and patient/family.  CBC: Recent Labs  Lab 02/25/23 0918 02/26/23 0519 02/27/23 0558 02/28/23 0445 03/01/23 0340  WBC 3.9* 5.4 5.5 5.9 5.3  HGB 7.8* 8.4* 8.1* 8.1* 7.9*  HCT 24.9* 27.0* 25.7* 25.9* 25.3*  MCV 94.3 95.7 94.8 95.9 95.5  PLT 57* 55* 58* 77* 82*   Basic Metabolic Panel: Recent Labs  Lab 02/25/23 0918 02/26/23 0519 02/27/23 0558 02/28/23 0445 03/01/23 0340  NA 140 139 136 140 139  K 3.0* 3.8 4.0 4.1 3.9  CL 113* 114* 112* 110 110  CO2 20* 21* 20* 20* 24  GLUCOSE 135* 139* 112* 108* 111*  BUN 6 <5* 5* 7 6  CREATININE 0.56* 0.65 0.58* 0.57* 0.56*  CALCIUM 7.7* 7.9* 8.0* 8.5* 8.3*  MG 1.9 1.9 1.8 1.8 1.7  PHOS 2.3* 2.7 2.6 2.7 3.1    Studies: DG Abd 1 View  Result Date: 02/28/2023 CLINICAL DATA:  Abdominal distension EXAM: ABDOMEN - 1 VIEW COMPARISON:  Radiograph 01/18/2023 FINDINGS: Nonobstructive bowel gas pattern. No radiopaque calculi overlie the kidneys. There is distension of the stomach with presumably ingested material. IMPRESSION: No evidence of bowel obstruction. Electronically Signed   By: Maurine Simmering M.D.   On: 02/28/2023 16:17   [place] ASCITES (ABDOMEN LIMITED)  Result Date: 02/28/2023 CLINICAL DATA:  52 year old male with history of ascites presenting for paracentesis. EXAM: LIMITED ABDOMEN ULTRASOUND FOR ASCITES TECHNIQUE: Limited ultrasound survey for ascites was performed in all four abdominal quadrants. COMPARISON:  None Available. FINDINGS: Trace perihepatic ascites. Trace ascites in the left lower quadrant no ascites in the right lower and  left upper quadrants. IMPRESSION: Trace ascites, no safe window for paracentesis. No paracentesis was performed. Ruthann Cancer, MD [facility] Radiology Electronically Signed   By: Ruthann Cancer M.D.   On: 02/28/2023 15:15    Scheduled Meds:  ezetimibe  10 mg Oral Daily   ferrous sulfate  325 mg Oral Q breakfast   folic acid  1 mg Oral Daily   gabapentin  300 mg Oral TID   hydrocortisone  20 mg Oral Daily   lactulose  20 g Oral BID   midodrine  10 mg Oral TID WC   multivitamin with minerals  1 tablet Oral Daily   nicotine  21 mg Transdermal Daily   octreotide  50 mcg Intravenous Once   pantoprazole  40 mg Oral BID   QUEtiapine  25 mg Oral QHS   rifaximin  550 mg Oral BID   rosuvastatin  40 mg Oral Daily   sodium chloride flush  10-40 mL Intracatheter Q12H   thiamine  100 mg Oral Daily   Or   thiamine  100 mg Intravenous Daily   Continuous Infusions:  sodium chloride Stopped (02/23/23 1806)   cefTRIAXone (ROCEPHIN)  IV 2 g (03/01/23 1054)   furosemide (LASIX) 200 mg in dextrose 5 % 100 mL (2 mg/mL) infusion 4 mg/hr (03/01/23 1226)   lactated ringers     octreotide (SANDOSTATIN) 500 mcg in sodium chloride 0.9 % 250 mL (2 mcg/mL) infusion     PRN Meds: acetaminophen **OR** acetaminophen, lidocaine, methocarbamol, metoCLOPramide (REGLAN) injection, nitroGLYCERIN, mouth rinse, oxyCODONE, sodium chloride flush, traZODone  Time spent: 50 minutes  Author: Val Riles. MD [facility] 03/01/2023 2:48 PM  To reach On-call, see care teams to locate the attending and reach out to them via [email or web address]. If 7PM-7AM, please contact night-coverage If you still have difficulty reaching the attending provider, please page the [facility] (Director on Call) for [facility] on amion for assistance.

## 2023-03-01 NOTE — Care Plan (Signed)
Pt seen again post op, pain is improving (although he did receive fentanyl).  His vital signs are stable. Not diaphoretic. Otherwise appears to be resting comfortably without further acute distress.  Cardiology has been consulted by hospitalist team and troponin is being drawn.  Ronne Binning, [facility]

## 2023-03-01 NOTE — Progress Notes (Signed)
At patient bedside in post-op, complaints of central chest pain, 10/10. Vital signs stable other than sinus tachycardia. He says it feels similar to a prior heart attack. He did however just get esophageal banding, which could present with pain in similar location.  Metoprolol administered for tachycardia to decrease cardiac workload. Fentanyl administered for analgesia.   EKG showing sinus rhythm, no apparent ST changes, similar to prior EKGs. Patient says after above interventions, pain is improving, 8/10 now. Less concern for ACS currently, however patient is higher cardiac risk. Will monitor. Hospitalist Val Riles MD paged to inform them.

## 2023-03-01 NOTE — Interval H&P Note (Signed)
History and Physical Interval Note: Preprocedure H&P from 03/01/23  was reviewed and there was no interval change after seeing and examining the patient.  Written consent was obtained from the patient after discussion of risks, benefits, and alternatives. Patient has consented to proceed with Esophagogastroduodenoscopy with possible intervention  Esophagogastroduodenoscopy with possible biopsy, control of bleeding, polypectomy, and interventions as necessary has been discussed with the patient/patient representative. Informed consent was obtained from the patient/patient representative after explaining the indication, nature, and risks of the procedure including but not limited to death, bleeding, perforation, missed neoplasm/lesions, cardiorespiratory compromise, and reaction to medications. Opportunity for questions was given and appropriate answers were provided. Patient/patient representative has verbalized understanding is amenable to undergoing the procedure.    03/01/2023  [name]  has presented today for surgery, with the diagnosis of melena, cirrhosis of liver.  The various methods of treatment have been discussed with the patient and family. After consideration of risks, benefits and other options for treatment, the patient has consented to  Procedure(s): ESOPHAGOGASTRODUODENOSCOPY (EGD) WITH PROPOFOL (N/A) as a surgical intervention.  The patient's history has been reviewed, patient examined, no change in status, stable for surgery.  I have reviewed the patient's chart and labs.  Questions were answered to the patient's satisfaction.     Annamaria Helling

## 2023-03-01 NOTE — Anesthesia Procedure Notes (Signed)
Procedure Name: Intubation Date/Time: 03/01/2023 8:08 AM  Performed by: Doreen Salvage, CRNAPre-anesthesia Checklist: Patient identified, Emergency Drugs available, Suction available and Patient being monitored Patient Re-evaluated:Patient Re-evaluated prior to induction Oxygen Delivery Method: Circle system utilized Preoxygenation: Pre-oxygenation with 100% oxygen Induction Type: IV induction, Cricoid Pressure applied and Rapid sequence Ventilation: Mask ventilation without difficulty Laryngoscope Size: Mac and 4 Grade View: Grade I Tube type: Oral Tube size: 7.5 mm Number of attempts: 1 Airway Equipment and Method: Stylet Placement Confirmation: ETT inserted through vocal cords under direct vision, positive ETCO2 and breath sounds checked- equal and bilateral Secured at: 23 cm Tube secured with: Tape Dental Injury: Teeth and Oropharynx as per pre-operative assessment

## 2023-03-01 NOTE — Op Note (Addendum)
[facility] Gastroenterology Patient Name: Ethan Wells Procedure Date: 03/01/2023 7:07 AM MRN: [id number] Account #: [id number] Date of Birth: [birth year] Admit Type: Inpatient Age: 52 Room: [facility] ENDO ROOM 4 Gender: Male Note Status: Finalized Instrument Name: Upper Endoscope [id number] Procedure:             Upper GI endoscopy Indications:           Hematemesis Providers:             Annamaria Helling DO, DO Medicines:             General Anesthesia, Initially MAC, but converted to                         general anesthesia once food contents were appreciated                         in gastric lumen. Complications:         No immediate complications. Estimated blood loss:                         Minimal. Procedure:             Pre-Anesthesia Assessment:                        - Prior to the procedure, a History and Physical was                         performed, and patient medications and allergies were                         reviewed. The patient is competent. The risks and                         benefits of the procedure and the sedation options and                         risks were discussed with the patient. All questions                         were answered and informed consent was obtained.                         Patient identification and proposed procedure were                         verified by the physician, the nurse, the                         anesthesiologist, the anesthetist and the technician                         in the endoscopy suite. Mental Status Examination:                         alert and oriented. Airway Examination: normal                         oropharyngeal airway and neck mobility. Respiratory  Examination: clear to auscultation. CV Examination:                         RRR, no murmurs, no S3 or S4. Prophylactic                         Antibiotics: The patient does not require prophylactic                          antibiotics. Prior Anticoagulants: The patient has                         taken no anticoagulant or antiplatelet agents. ASA                         Grade Assessment: III - A patient with severe systemic                         disease. After reviewing the risks and benefits, the                         patient was deemed in satisfactory condition to                         undergo the procedure. The anesthesia plan was to use                         general anesthesia. Immediately prior to                         administration of medications, the patient was                         re-assessed for adequacy to receive sedatives. The                         heart rate, respiratory rate, oxygen saturations,                         blood pressure, adequacy of pulmonary ventilation, and                         response to care were monitored throughout the                         procedure. The physical status of the patient was                         re-assessed after the procedure.                        - Initially started teh procedure as MAC (monitored                         anesthesia), but due to large amount of food in the                         gastric lumen, the patient was converted to general  anesthesia with intubation.                        After obtaining informed consent, the endoscope was                         passed under direct vision. Throughout the procedure,                         the patient's blood pressure, pulse, and oxygen                         saturations were monitored continuously. The Endoscope                         was introduced through the mouth, and advanced to the                         second part of duodenum. The upper GI endoscopy was                         accomplished without difficulty. The patient tolerated                         the procedure well. Findings:      The duodenal bulb, first portion  of the duodenum and second portion of       the duodenum were normal. Estimated blood loss: none.      A large amount of food (residue) was found in the entire examined       stomach. Lavaged to help improved visualization. However, due to the       volume of food, much of the greater curvature could not be visualized.      Localized mild inflammation characterized by erosions was found in the       gastric antrum. Estimated blood loss: none.      Mild portal hypertensive gastropathy was found in the gastric body. No       active bleeding or blood seen in the gastric lumen. No GAVE appreciated.       No gastric varices in visualized gastric lumen, however, greater       curvature visualization was limited by food. Estimated blood loss: none.      There were esophageal mucosal changes classified as Barrett's stage       C0-M3 per Prague criteria present in the lower third of the esophagus.       The maximum longitudinal extent of these mucosal changes was 3 cm in       length. Biopsies were not performed as the salmon colored mucosa was       overlying the esophageal varices. Estimated blood loss: none.      A small hiatal hernia was present. Estimated blood loss: none.      Grade II with red wale sign varices were found in the lower third of the       esophagus. Two bands were successfully placed with complete eradication,       resulting in deflation of varices. There was no bleeding at the end of       the procedure. distal decompression of variceal columns after banding.      The exam of the esophagus was  otherwise normal. Impression:            - Normal duodenal bulb, first portion of the duodenum                         and second portion of the duodenum.                        - A large amount of food (residue) in the stomach.                        - Gastritis.                        - Portal hypertensive gastropathy.                        - Esophageal mucosal changes classified as  Barrett's                         stage C0-M3 per Prague criteria.                        - Small hiatal hernia.                        - Grade II with red wale sign esophageal varices.                         Completely eradicated. Banded.                        - No specimens collected. Recommendation:        - Return patient to hospital ward for ongoing care.                        - Clear liquid diet.                        - Continue present medications.                        - Continue ppi                        - consider nadolol 10 mg daily and after 3 days and                         increase dosage to achieve resting heart rate of 55-60                         bpm                        start ceftriaxone for sbp ppx given ugib in cirrhosis                        start octreotide for portal htn reduction                        outpatient workup for gastroparesis                        -  Use Protonix (pantoprazole) 40 mg PO BID.                        - Repeat upper endoscopy in 2 weeks for retreatment.                        - Return to GI office at appointment to be scheduled.                        - The findings and recommendations were discussed with                         the patient.                        - The findings and recommendations were discussed with                         the referring physician.                        - Given patient's history of poor compliance, he may                         benefit from TIPS to prevent further variceal bleeding                         over repeated esophageal varices banding and beta                         blockade.                        - Potentially will need biopsies for barretts                         esophagus, however, given varices, this makes this                         difficult. Life expectancy given active substance                         abuse and now decompensated cirrhosis in setting of                          esophagal varices bleeding is limited and further                         surveillance may not be warranted. Procedure Code(s):     --- Professional ---                        [phone], Esophagogastroduodenoscopy, flexible,                         transoral; with band ligation of esophageal/gastric                         varices Diagnosis Code(s):     --- Professional ---  K22.70, Barrett's esophagus without dysplasia                        K29.70, Gastritis, unspecified, without bleeding                        K76.6, Portal hypertension                        K31.89, Other diseases of stomach and duodenum                        K44.9, Diaphragmatic hernia without obstruction or                         gangrene                        I85.00, Esophageal varices without bleeding                        K92.0, Hematemesis CPT copyright 2022 [employer]. All rights reserved. The codes documented in this report are preliminary and upon coder review may  be revised to meet current compliance requirements. Attending Participation:      I personally performed the entire procedure. Volney American, DO Annamaria Helling DO, DO 03/01/2023 8:45:26 AM This report has been signed electronically. Number of Addenda: 0 Note Initiated On: 03/01/2023 7:07 AM Estimated Blood Loss:  Estimated blood loss was minimal.      [facility]

## 2023-03-01 NOTE — Anesthesia Preprocedure Evaluation (Addendum)
Anesthesia Evaluation  Patient identified by MRN, date of birth, ID band Patient awake    Reviewed: Allergy & Precautions, NPO status , Patient's Chart, lab work & pertinent test results  History of Anesthesia Complications Negative for: history of anesthetic complications  Airway Mallampati: III  TM Distance: >3 FB Neck ROM: full    Dental no notable dental hx. (+) Poor Dentition, Edentulous Upper, Chipped, Missing   Pulmonary Current Smoker and Patient abstained from smoking.   Pulmonary exam normal breath sounds clear to auscultation       Cardiovascular hypertension, + CAD, + Past MI, + Peripheral Vascular Disease and +CHF  Normal cardiovascular exam Rhythm:Regular Rate:Normal - Systolic murmurs    Neuro/Psych  Headaches PSYCHIATRIC DISORDERS Anxiety Depression       GI/Hepatic ,GERD  Medicated,,(+) Cirrhosis     substance abuse  alcohol use, cocaine use and marijuana use  Endo/Other  negative endocrine ROS    Renal/GU      Musculoskeletal   Abdominal   Peds  Hematology  (+) Blood dyscrasia, anemia   Anesthesia Other Findings Past Medical History: No date: CAD (coronary artery disease)     Comment:  a. 01/2011 Anterior STEMI/Cath/PCI: LM nl, LAD 100d               (3.5x75mm Vision BMS placed), LCX 32m, RI 50, RCA min               irregs, EF 40% w/ apical, inferoapical HK. No date: Cardiac arrest - ventricular fibrillation     Comment:  a. In setting of STEMI 01/2011 No date: Chronic pain No date: Cocaine abuse ([place]) No date: Depression with anxiety No date: ETOH abuse     Comment:  a. 6-12 beers/day No date: GERD (gastroesophageal reflux disease) No date: Hemorrhoids No date: Hyperlipidemia No date: Hypertension No date: Ischemic cardiomyopathy     Comment:  a. 06/2011 Echo: EF 45-50%, No rwma No date: Marijuana abuse No date: Migraines No date: Tobacco abuse     Comment:  a. 1/2 ppd x 26  yrs  Past Surgical History: 08/09/2021: COLONOSCOPY     Comment:  Procedure: COLONOSCOPY;  Surgeon: Lucilla Lame, MD;                Location: [facility] ENDOSCOPY;  Service: Endoscopy;; No date: CORONARY ANGIOPLASTY WITH STENT PLACEMENT 05/30/2022: ESOPHAGOGASTRODUODENOSCOPY; N/A     Comment:  Procedure: ESOPHAGOGASTRODUODENOSCOPY (EGD);  Surgeon:               Lin Landsman, MD;  Location: [facility] ENDOSCOPY;                Service: Gastroenterology;  Laterality: N/A; 03/01/2021: ESOPHAGOGASTRODUODENOSCOPY (EGD) WITH PROPOFOL; N/A     Comment:  Procedure: ESOPHAGOGASTRODUODENOSCOPY (EGD) WITH               PROPOFOL;  Surgeon: Lin Landsman, MD;  Location:               [place];  Service: Endoscopy;  Laterality:               N/A; 08/09/2021: ESOPHAGOGASTRODUODENOSCOPY (EGD) WITH PROPOFOL; N/A     Comment:  Procedure: ESOPHAGOGASTRODUODENOSCOPY (EGD) WITH               PROPOFOL;  Surgeon: Lucilla Lame, MD;  Location: [facility]               ENDOSCOPY;  Service: Endoscopy;  Laterality: N/A; 10/02/2022: FLEXIBLE SIGMOIDOSCOPY; N/A  Comment:  Procedure: FLEXIBLE SIGMOIDOSCOPY;  Surgeon: Ladene Artist, MD;  Location: [place];  Service:               Gastroenterology;  Laterality: N/A; 10/24/2022: VISCERAL ANGIOGRAPHY; N/A     Comment:  Procedure: VISCERAL ANGIOGRAPHY;  Surgeon: Algernon Huxley,              MD;  Location: [place] CV LAB;  Service:               Cardiovascular;  Laterality: N/A;  BMI    Body Mass Index: 31.00 kg/m      Reproductive/Obstetrics negative OB ROS                             Anesthesia Physical Anesthesia Plan  ASA: 3  Anesthesia Plan: General   Post-op Pain Management: Minimal or no pain anticipated   Induction: Intravenous  PONV Risk Score and Plan: 1 and Ondansetron, Treatment may vary due to age or medical condition, TIVA and Propofol infusion  Airway Management Planned: Natural  Airway  Additional Equipment: None  Intra-op Plan:   Post-operative Plan: Extubation in OR  Informed Consent: I have reviewed the patients History and Physical, chart, labs and discussed the procedure including the risks, benefits and alternatives for the proposed anesthesia with the patient or authorized representative who has indicated his/her understanding and acceptance.     Dental Advisory Given  Plan Discussed with: Anesthesiologist, CRNA and Surgeon  Anesthesia Plan Comments: (Patient says he ate a brownie last night, he believes it was before midnight. Denies any nausea or vomiting for past week.  Discussed risks of anesthesia with patient, including possibility of difficulty with spontaneous ventilation under anesthesia necessitating airway intervention, PONV, aspiration, and rare risks such as cardiac or respiratory or neurological events, and allergic reactions. Discussed the role of CRNA in patient's perioperative care. Patient understands.  Appropriate for natural airway. )        Anesthesia Quick Evaluation

## 2023-03-01 NOTE — Transfer of Care (Signed)
Immediate Anesthesia Transfer of Care Note  Patient: [name]  Procedure(s) Performed: Procedure(s): ESOPHAGOGASTRODUODENOSCOPY (EGD) WITH PROPOFOL (N/A)  Patient Location: PACU   Anesthesia Type:General  Level of Consciousness: awake  Airway & Oxygen Therapy: Patient Spontanous Breathing and Patient connected to nasal cannula oxygen  Post-op Assessment: Report given to RN and Post -op Vital signs reviewed and stable  Post vital signs: Reviewed and stable  Last Vitals:  Vitals:   03/01/23 0845 03/01/23 0857  BP: 111/76 128/77  Pulse:  84  Resp:  19  Temp:    SpO2:  [id number]    Complications: No apparent anesthesia complications

## 2023-03-01 NOTE — Consult Note (Signed)
Cardiology Consultation   Patient ID: Ethan Wells MRN: [id number]; DOB: 15-Oct-1971  Admit date: 02/22/2023 Date of Consult: 03/01/2023  PCP:  Nickola Major, NP   [place] Providers Cardiologist:  Lauree Chandler, MD   {   [name] is a 52 y.o. male with a hx of CAD s/p STEMI and VF arrest in 01/2011 with LAD stenting, NSTEMI 10/2022, HTN, HL, ICM w/ improved Ef, polysubstance abuse, hemorrhoidal bleeding w/ anemia and mesenteric dz s/p SMA stenting who is being seen 03/01/2023 for the evaluation of chest pain at the request of Dr. Dwyane Dee.  History of Present Illness:   Ethan Wells is followed by Dr. Angelena Form for the above cardiac history. More recently the patient was admitted in 10/2022 w/ NSTEMI and rectal bleeding/anemia. Cath revealed patent LAD stent w/ occluded Lcx and RCA w/ L>L and L>R collaterals. He was medically managed and required ligation of his hemorrhoids x 2 during admission. He was diuresed and discharged at 199lbs. He was readmitted to [facility] 11/2022 due to recurrent rectal bleeding and chest/back pain (msk/pleuritic) HS trop wnl. UDS + for cocaine. He was conservatively managed from a cardiac standpoint and Imdur was added. H/H were stable during admission. He was readmitted in 01/2023 with DOE and chest pain. He was to have severe hypotension requiring norepinephrine, profound anemia (globin 6.3) and cocaine abuse.  Troponin was elevated 36, suspected supply demand ischemia.  NP 105.  Beta-blocker was held setting of hypotension and aspirin was held in the setting of anemia.  Patient was on a good candidate for cardiac cath given ongoing bleeding.  Patient presented to [facility] 03/01/2023 for scheduled EGD procedure.  Was found to have Barrett's esophagus, esophageal varices.  Postop patient complained of chest pain similar to prior heart attack.  Cardiology was asked to see.  EKG showed no changes.  Patient was given  pain meds with improvement of chest pain.  Plan  hospital admission.   HS troponin came back at 23.  UDS negative for cocaine. Allbumin 2.5.   Past Medical History:  Diagnosis Date   CAD (coronary artery disease)    a. 01/2011 Anterior STEMI/Cath/PCI: LM nl, LAD 100d (3.5x94mm Vision BMS placed), LCX 63m, RI 50, RCA min irregs, EF 40% w/ apical, inferoapical HK; b. 10/2022 NSTSEMI/Cath: LM nl, LAD 5p/m, RI 85, LCX 12m, OM1 mild dzs, OM3 100 fills via L->L collats from dLAD, RCA 80p, 126m, RPDA fills via collats from LAD. EF 45-50%-->Med Rx.   Cardiac arrest - ventricular fibrillation    a. In setting of STEMI 01/2011   Chronic pain    Cocaine abuse ([place])    Depression with anxiety    ETOH abuse    a. 6-12 beers/day   GERD (gastroesophageal reflux disease)    GI bleed    Hemorrhoids    Hyperlipidemia    Hypertension    Ischemic cardiomyopathy    a. 06/2011 Echo: EF 45-50%, No rwma; b. 10/2022 Echo: EF 55-60%, no rwma, nl RV fxn.   Marijuana abuse    Migraines    Tobacco abuse    a. 1/2 ppd x 26 yrs    Past Surgical History:  Procedure Laterality Date   COLONOSCOPY  08/09/2021   Procedure: COLONOSCOPY;  Surgeon: Lucilla Lame, MD;  Location: [facility] ENDOSCOPY;  Service: Endoscopy;;   CORONARY ANGIOPLASTY WITH STENT PLACEMENT     ESOPHAGOGASTRODUODENOSCOPY N/A 05/30/2022   Procedure: ESOPHAGOGASTRODUODENOSCOPY (EGD);  Surgeon: Lin Landsman,  MD;  Location: [facility] ENDOSCOPY;  Service: Gastroenterology;  Laterality: N/A;   ESOPHAGOGASTRODUODENOSCOPY (EGD) WITH PROPOFOL N/A 03/01/2021   Procedure: ESOPHAGOGASTRODUODENOSCOPY (EGD) WITH PROPOFOL;  Surgeon: Lin Landsman, MD;  Location: [place];  Service: Endoscopy;  Laterality: N/A;   ESOPHAGOGASTRODUODENOSCOPY (EGD) WITH PROPOFOL N/A 08/09/2021   Procedure: ESOPHAGOGASTRODUODENOSCOPY (EGD) WITH PROPOFOL;  Surgeon: Lucilla Lame, MD;  Location: [facility] ENDOSCOPY;  Service: Endoscopy;  Laterality: N/A;   EVALUATION UNDER  ANESTHESIA WITH HEMORRHOIDECTOMY N/A 10/31/2022   Procedure: EXAM UNDER ANESTHESIA WITH SUTURE LIGATION OF TWO BLEEDING PEDICLES;  Surgeon: Olean Ree, MD;  Location: [facility] ORS;  Service: General;  Laterality: N/A;   FLEXIBLE SIGMOIDOSCOPY N/A 10/02/2022   Procedure: FLEXIBLE SIGMOIDOSCOPY;  Surgeon: Ladene Artist, MD;  Location: [place];  Service: Gastroenterology;  Laterality: N/A;   LEFT HEART CATH AND CORONARY ANGIOGRAPHY N/A 11/04/2022   Procedure: LEFT HEART CATH AND CORONARY ANGIOGRAPHY;  Surgeon: Wellington Hampshire, MD;  Location: [place] CV LAB;  Service: Cardiovascular;  Laterality: N/A;   VISCERAL ANGIOGRAPHY N/A 10/24/2022   Procedure: VISCERAL ANGIOGRAPHY;  Surgeon: Algernon Huxley, MD;  Location: [place] CV LAB;  Service: Cardiovascular;  Laterality: N/A;     Home Medications:  Prior to Admission medications   Medication Sig Start Date End Date Taking? Authorizing Provider  folic acid (FOLVITE) 1 MG tablet Take 1 tablet (1 mg total) by mouth daily. 02/01/23  Yes Fritzi Mandes, MD  gabapentin (NEURONTIN) 300 MG capsule Take 1 capsule (300 mg total) by mouth 3 (three) times daily. 10/17/22  Yes Medina-Vargas, Monina C, NP  hydrocortisone (CORTEF) 10 MG tablet Take 1 tablet (10 mg total) by mouth daily. 01/31/23  Yes Fritzi Mandes, MD  isosorbide mononitrate (IMDUR) 30 MG 24 hr tablet Take 15 mg by mouth daily. 02/22/23  Yes [provider]  methocarbamol (ROBAXIN) 750 MG tablet TAKE 1 TABLET (750 MG TOTAL) BY MOUTH DAILY AS NEEDED FOR MUSCLE SPASMS 02/06/23  Yes Medina-Vargas, Monina C, NP  midodrine (PROAMATINE) 10 MG tablet Take 1 tablet (10 mg total) by mouth 3 (three) times daily with meals. 01/31/23  Yes Fritzi Mandes, MD  pantoprazole (PROTONIX) 40 MG tablet TAKE 1 TABLET BY MOUTH EVERY DAY 12/23/22  Yes Medina-Vargas, Monina C, NP  QUEtiapine (SEROQUEL) 25 MG tablet Take 1 tablet (25 mg total) by mouth at bedtime. 01/31/23  Yes Fritzi Mandes, MD  rosuvastatin (CRESTOR)  40 MG tablet TAKE 1 TABLET BY MOUTH EVERY DAY 12/19/22  Yes Medina-Vargas, Monina C, NP  torsemide (DEMADEX) 20 MG tablet Take 1 tablet (20 mg total) by mouth daily. 02/01/23  Yes Fritzi Mandes, MD  acetaminophen (TYLENOL) 500 MG tablet Take 2 tablets (1,000 mg total) by mouth every 6 (six) hours as needed for mild pain. Patient not taking: Reported on 01/17/2023 10/28/22   Olean Ree, MD  ezetimibe (ZETIA) 10 MG tablet Take 10 mg by mouth daily. Patient not taking: Reported on 02/28/2023    [provider]  ferrous sulfate 325 (65 FE) MG tablet Take 1 tablet (325 mg total) by mouth daily with breakfast. Patient not taking: Reported on 02/28/2023 01/31/23 05/01/23  Fritzi Mandes, MD  furosemide (LASIX) 40 MG tablet Take 40 mg by mouth daily. Patient not taking: Reported on 02/28/2023 02/01/23   [provider]  hydrocortisone (CORTEF) 20 MG tablet Take 1 tablet (20 mg total) by mouth daily. Patient not taking: Reported on 02/28/2023 02/01/23   Fritzi Mandes, MD  nitroGLYCERIN (NITROSTAT) 0.4 MG SL tablet  Place 1 tablet (0.4 mg total) under the tongue every 5 (five) minutes as needed for chest pain. 11/06/22   Enzo Bi, MD  oxyCODONE 10 MG TABS Take 1 tablet (10 mg total) by mouth every 8 (eight) hours as needed for severe pain. 01/31/23   Fritzi Mandes, MD  thiamine (VITAMIN B-1) 100 MG tablet Take 1 tablet (100 mg total) by mouth daily. Patient not taking: Reported on 02/28/2023 02/01/23   Fritzi Mandes, MD    Inpatient Medications: Scheduled Meds:  [MAR Hold] ezetimibe  10 mg Oral Daily   [MAR Hold] ferrous sulfate  325 mg Oral Q breakfast   [MAR Hold] folic acid  1 mg Oral Daily   [MAR Hold] gabapentin  300 mg Oral TID   [MAR Hold] hydrocortisone  20 mg Oral Daily   [MAR Hold] lactulose  20 g Oral BID   [MAR Hold] midodrine  10 mg Oral TID WC   [MAR Hold] multivitamin with minerals  1 tablet Oral Daily   [MAR Hold] nicotine  21 mg Transdermal Daily   octreotide  50 mcg Intravenous Once    [MAR Hold] pantoprazole  40 mg Oral BID   [MAR Hold] QUEtiapine  25 mg Oral QHS   [MAR Hold] rifaximin  550 mg Oral BID   [MAR Hold] rosuvastatin  40 mg Oral Daily   [MAR Hold] sodium chloride flush  10-40 mL Intracatheter Q12H   [MAR Hold] thiamine  100 mg Oral Daily   Or   [MAR Hold] thiamine  100 mg Intravenous Daily   Continuous Infusions:  [MAR Hold] sodium chloride Stopped (02/23/23 1806)   sodium chloride 0 mL (02/27/23 0900)   sodium chloride     cefTRIAXone (ROCEPHIN)  IV     [MAR Hold] lactated ringers     octreotide (SANDOSTATIN) 500 mcg in sodium chloride 0.9 % 250 mL (2 mcg/mL) infusion     PRN Meds: [MAR Hold] acetaminophen **OR** [MAR Hold] acetaminophen, HYDROmorphone (DILAUDID) injection, [MAR Hold] methocarbamol, [MAR Hold] metoCLOPramide (REGLAN) injection, [MAR Hold] nitroGLYCERIN, [MAR Hold] mouth rinse, [MAR Hold] oxyCODONE, [MAR Hold] sodium chloride flush, [MAR Hold] traZODone  Allergies:    Allergies  Allergen Reactions   Penicillins Hives    Tolerated Ceftriaxone 10/2022   Other Rash    chives    Social History:   Social History   Socioeconomic History   Marital status: Single    Spouse name: Not on file   Number of children: Not on file   Years of education: Not on file   Highest education level: Not on file  Occupational History   Not on file  Tobacco Use   Smoking status: Every Day    Packs/day: 0.50    Years: 41.00    Additional pack years: 0.00    Total pack years: 20.50    Types: Cigarettes   Smokeless tobacco: Never   Tobacco comments:    started age 59  Vaping Use   Vaping Use: Never used  Substance and Sexual Activity   Alcohol use: Yes    Alcohol/week: 12.0 standard drinks of alcohol    Types: 12 Cans of beer per week    Comment: at least a 6 pack of beer daily and often a 12 pack   Drug use: Not Currently    Types: Cocaine, Marijuana   Sexual activity: Not on file  Other Topics Concern   Not on file  Social History  Narrative   Single, lives in [place] with his  mother.  He does not routinely exercise.  He sometimes works, helping to deliver appliances to homes.   Social Determinants of Health   Financial Resource Strain: Not on file  Food Insecurity: No Food Insecurity (02/22/2023)   Hunger Vital Sign    Worried About Running Out of Food in the Last Year: Never true    Ran Out of Food in the Last Year: Never true  Transportation Needs: No Transportation Needs (02/22/2023)   PRAPARE - Hydrologist (Medical): No    Lack of Transportation (Non-Medical): No  Physical Activity: Not on file  Stress: Not on file  Social Connections: Not on file  Intimate Partner Violence: Not At Risk (02/22/2023)   Humiliation, Afraid, Rape, and Kick questionnaire    Fear of Current or Ex-Partner: No    Emotionally Abused: No    Physically Abused: No    Sexually Abused: No    Family History:    Family History  Problem Relation Age of Onset   Coronary artery disease Mother        alive   Other Other        2 sisters alive and well   Alcohol abuse Father        died @ 99   Other Paternal Grandmother        PACER     ROS:  Please see the history of present illness.   All other ROS reviewed and negative.     Physical Exam/Data:   Vitals:   03/01/23 0845 03/01/23 0857 03/01/23 0900 03/01/23 0915  BP: 111/76 128/77 132/76 124/74  Pulse:  84 83 82  Resp:  19 12 18   Temp:    97.8 F (36.6 C)  TempSrc:      SpO2:  98% 98% 99%  Weight:      Height:        Intake/Output Summary (Last 24 hours) at 03/01/2023 0931 Last data filed at 03/01/2023 0600 Gross per 24 hour  Intake 840 ml  Output 1000 ml  Net -160 ml      02/22/2023    9:05 AM 01/20/2023    1:30 AM 01/17/2023    4:30 AM  Last 3 Weights  Weight (lbs) 186 lb 8.2 oz 229 lb 11.5 oz 224 lb 3.3 oz  Weight (kg) 84.6 kg 104.2 kg 101.7 kg     Body mass index is 31.04 kg/m.  General:  Well nourished, well developed, in no  acute distress HEENT: normal Neck: no JVD Vascular: No carotid bruits; Distal pulses 2+ bilaterally Cardiac:  normal S1, S2; RRR; no murmur  Lungs:  clear to auscultation bilaterally, no wheezing, rhonchi or rales  Abd: soft, nontender, no hepatomegaly  Ext: no edema Musculoskeletal:  No deformities, BUE and BLE strength normal and equal Skin: warm and dry  Neuro:  CNs 2-12 intact, no focal abnormalities noted Psych:  Normal affect   EKG:  The EKG was personally reviewed and demonstrates:  NSR, 86bpm, TWI III and aVF Telemetry:  Telemetry was personally reviewed and demonstrates:  /A  Relevant CV Studies:  Echo 01/2023  1. Left ventricular ejection fraction, by estimation, is 50 to 55%. The  left ventricle has low normal function. The left ventricle has no regional  wall motion abnormalities. Left ventricular diastolic parameters are  indeterminate.   2. Right ventricular systolic function is normal. The right ventricular  size is normal.   3. The mitral valve is normal in structure.  Mild mitral valve  regurgitation. No evidence of mitral stenosis.   4. The aortic valve is normal in structure. Aortic valve regurgitation is  not visualized. No aortic stenosis is present.   5. The inferior vena cava is normal in size with greater than 50%  respiratory variability, suggesting right atrial pressure of 3 mmHg.   Cardiac Cath 10/2022    Mid Cx lesion is 100% stenosed.   Ramus lesion is 85% stenosed.   Prox LAD to Mid LAD lesion is 5% stenosed.   Prox RCA to Mid RCA lesion is 100% stenosed.   Prox RCA lesion is 80% stenosed.   There is mild left ventricular systolic dysfunction.   LV end diastolic pressure is moderately elevated.   The left ventricular ejection fraction is 45-50% by visual estimate.   1.  Severe two-vessel coronary artery disease with chronically occluded mid left circumflex and mid right coronary artery with collaterals.  These vessels were patent with mild  disease on previous cardiac catheterization in 2012.  LAD stent is patent with minimal restenosis.  In addition, there is progression of proximal to mid ramus stenosis. 2.  Mildly reduced LV systolic function with an EF of 45%. 3.  Moderately elevated left ventricular end-diastolic pressure at 26 mmHg.   Recommendations: The patient has 2 chronic occlusions with collaterals.  Recommend medical therapy for now.  I added small dose Imdur.  Given ongoing rectal bleeding, risks of PCI outweigh the benefit at this time.  In the near future, once his bleeding issues are controlled and if he continues to have anginal symptoms, PCI of the ramus and possibly the right coronary artery can be considered.  The RCA has a microchannel of patent flow. Continue IV furosemide today given elevated LVEDP and switch to oral likely tomorrow.    Laboratory Data:  High Sensitivity Troponin:   Recent Labs  Lab 02/23/23 0713 02/23/23 1713 02/23/23 1924 02/23/23 2142 02/24/23 0132  TROPONINIHS 101* 98* 84* 120* 143*     Chemistry Recent Labs  Lab 02/27/23 0558 02/28/23 0445 03/01/23 0340  NA 136 140 139  K 4.0 4.1 3.9  CL 112* 110 110  CO2 20* 20* 24  GLUCOSE 112* 108* 111*  BUN 5* 7 6  CREATININE 0.58* 0.57* 0.56*  CALCIUM 8.0* 8.5* 8.3*  MG 1.8 1.8 1.7  GFRNONAA >60 >60 >60  ANIONGAP 4* 10 5    Recent Labs  Lab 02/22/23 2132 02/28/23 0445 03/01/23 0340  PROT 4.7* 5.7* 5.5*  ALBUMIN 2.0* 2.5* 2.5*  AST 37 44* 61*  ALT 13 21 26   ALKPHOS 91 129* 134*  BILITOT 2.4* 1.3* 1.1   Lipids No results for input(s): "CHOL", "TRIG", "HDL", "LABVLDL", "LDLCALC", "CHOLHDL" in the last 168 hours.  Hematology Recent Labs  Lab 02/27/23 0558 02/28/23 0445 03/01/23 0340  WBC 5.5 5.9 5.3  RBC 2.71* 2.70* 2.65*  HGB 8.1* 8.1* 7.9*  HCT 25.7* 25.9* 25.3*  MCV 94.8 95.9 95.5  MCH 29.9 30.0 29.8  MCHC 31.5 31.3 31.2  RDW 20.9* 21.0* 21.2*  PLT 58* 77* 82*   Thyroid No results for input(s): "TSH",  "FREET4" in the last 168 hours.  BNPNo results for input(s): "BNP", "PROBNP" in the last 168 hours.  DDimer No results for input(s): "DDIMER" in the last 168 hours.   Radiology/Studies:  DG Abd 1 View  Result Date: 02/28/2023 CLINICAL DATA:  Abdominal distension EXAM: ABDOMEN - 1 VIEW COMPARISON:  Radiograph 01/18/2023 FINDINGS: Nonobstructive bowel gas pattern. No  radiopaque calculi overlie the kidneys. There is distension of the stomach with presumably ingested material. IMPRESSION: No evidence of bowel obstruction. Electronically Signed   By: Maurine Simmering M.D.   On: 02/28/2023 16:17   [place] ASCITES (ABDOMEN LIMITED)  Result Date: 02/28/2023 CLINICAL DATA:  52 year old male with history of ascites presenting for paracentesis. EXAM: LIMITED ABDOMEN ULTRASOUND FOR ASCITES TECHNIQUE: Limited ultrasound survey for ascites was performed in all four abdominal quadrants. COMPARISON:  None Available. FINDINGS: Trace perihepatic ascites. Trace ascites in the left lower quadrant no ascites in the right lower and left upper quadrants. IMPRESSION: Trace ascites, no safe window for paracentesis. No paracentesis was performed. Ruthann Cancer, MD [facility] Radiology Electronically Signed   By: Ruthann Cancer M.D.   On: 02/28/2023 15:15     Assessment and Plan:   Chest pain CAD -History of LAD stenting in the setting of STEMI/VF arrest in 2012.  Recent non-STEMI November 2023 with findings of patent LAD stent and occluded left circumflex and RCA with collaterals from LAD filling both distal territories - patient reported chest pain in the setting of EGD with banding - HS troponin 23, continue to trend troponin - EKG nonischemic - Echo showed 01/2023 showed LVEF 50-55%, no WMA, mild MR.  - no plan for repeat echo - PTA Imdur 30mg  daily, Zetia, Crestor 40mg  daily - No BB with cocaine use.  - he is on midodrine 10mg  TID, Imdur held - chest pain is improving with  fentanyl. Suspect chest pain is mostly GI related  Chronic HFpEF LLE - Most recent echo 01/2023 showed LVEF 50-55% - some swelling on exam but albumin is 2.5 - kidney function stable - PTA lasix 40mg  daily. Med list also mentions torsemide - may need dose of IV lasix, restart home lasix 40mg  daily  For questions or updates, please contact [place] Please consult [email or web address] for contact info under    Signed, Mindee Robledo Ninfa Meeker, PA-C  03/01/2023 9:31 AM

## 2023-03-01 NOTE — Progress Notes (Addendum)
GI Post Op Note  Patient underwent upper endoscopy.  Initially was MAC, however, large amount of food within the gastric lumen and transition to general anesthesia with intubation.  Found to have Barrett's esophagus, however, this salmon-colored mucosa was overlying esophageal varices and was not biopsied.  He was found to have 3 columns of esophageal varices, one of which with red wale sign.  He received EVL x 2 with good proximal decompression of the varices.  No bleeding after procedure.  Postoperatively the patient is complaining of chest pain and actively undergoing ECG.  Discussed with hospitalist and cardiology consult is being placed. If cardiology w/u negative and pt continues to have discomfort, can trial viscous lidocaine  Recommend continuation of twice a day PPI Start octreotide bolus and infusion for 72 hours Start ceftriaxone for 7 days.  Can transition to ciprofloxacin 500 mg twice daily if discharged prior.  Normally would recommend nadolol starting after octreotide, however, given the patient's history of poor compliance and cocaine use he will not qualify. He would benefit more from TIPS.  - Clear liquid diet.  - outpatient workup for gastroparesis  - Repeat upper endoscopy in 2 weeks for retreatment.   - Potentially will need biopsies for barretts esophagus, however, given varices, this makes this difficult. Life expectancy given active substance abuse and now decompensated cirrhosis in setting of esophagal varices bleeding is limited and further surveillance may not be warranted.  GI will continue to follow  Ronne Binning, [facility]

## 2023-03-02 DIAGNOSIS — R079 Chest pain, unspecified: Secondary | ICD-10-CM

## 2023-03-02 DIAGNOSIS — I5022 Chronic systolic (congestive) heart failure: Secondary | ICD-10-CM | POA: Diagnosis not present

## 2023-03-02 DIAGNOSIS — F1910 Other psychoactive substance abuse, uncomplicated: Secondary | ICD-10-CM | POA: Diagnosis not present

## 2023-03-02 DIAGNOSIS — K922 Gastrointestinal hemorrhage, unspecified: Secondary | ICD-10-CM | POA: Diagnosis not present

## 2023-03-02 LAB — HEPATIC FUNCTION PANEL
ALT: 30 U/L (ref 0–44)
AST: 61 U/L — ABNORMAL HIGH (ref 15–41)
Albumin: 2.9 g/dL — ABNORMAL LOW (ref 3.5–5.0)
Alkaline Phosphatase: 139 U/L — ABNORMAL HIGH (ref 38–126)
Bilirubin, Direct: 0.4 mg/dL — ABNORMAL HIGH (ref 0.0–0.2)
Indirect Bilirubin: 0.9 mg/dL (ref 0.3–0.9)
Total Bilirubin: 1.3 mg/dL — ABNORMAL HIGH (ref 0.3–1.2)
Total Protein: 6.7 g/dL (ref 6.5–8.1)

## 2023-03-02 LAB — BASIC METABOLIC PANEL
Anion gap: 8 (ref 5–15)
BUN: 6 mg/dL (ref 6–20)
CO2: 24 mmol/L (ref 22–32)
Calcium: 8.4 mg/dL — ABNORMAL LOW (ref 8.9–10.3)
Chloride: 105 mmol/L (ref 98–111)
Creatinine, Ser: 0.62 mg/dL (ref 0.61–1.24)
GFR, Estimated: >60 mL/min (ref >60)
Glucose, Bld: 168 mg/dL — ABNORMAL HIGH (ref 70–99)
Potassium: 3.3 mmol/L — ABNORMAL LOW (ref 3.5–5.1)
Sodium: 137 mmol/L (ref 135–145)

## 2023-03-02 LAB — CBC
HCT: 26.8 % — ABNORMAL LOW (ref 39.0–52.0)
Hemoglobin: 8.4 g/dL — ABNORMAL LOW (ref 13.0–17.0)
MCH: 29.7 pg (ref 26.0–34.0)
MCHC: 31.3 g/dL (ref 30.0–36.0)
MCV: 94.7 fL (ref 80.0–100.0)
Platelets: 98 10*3/uL — ABNORMAL LOW (ref 150–400)
RBC: 2.83 MIL/uL — ABNORMAL LOW (ref 4.22–5.81)
RDW: 21.2 % — ABNORMAL HIGH (ref 11.5–15.5)
WBC: 5.8 10*3/uL (ref 4.0–10.5)
nRBC: 0 % (ref 0.0–0.2)

## 2023-03-02 LAB — PROTIME-INR
INR: 1.3 — ABNORMAL HIGH (ref 0.8–1.2)
Prothrombin Time: 16 seconds — ABNORMAL HIGH (ref 11.4–15.2)

## 2023-03-02 LAB — MAGNESIUM: Magnesium: 1.7 mg/dL (ref 1.7–2.4)

## 2023-03-02 LAB — PHOSPHORUS: Phosphorus: 3.1 mg/dL (ref 2.5–4.6)

## 2023-03-02 MED ORDER — POTASSIUM CHLORIDE CRYS ER 20 MEQ PO TBCR
40.0000 meq | EXTENDED_RELEASE_TABLET | Freq: Once | ORAL | Status: AC
  Administered 2023-03-02: 40 meq via ORAL
  Filled 2023-03-02: qty 2

## 2023-03-02 NOTE — Progress Notes (Addendum)
GI Inpatient Follow-up Note  Subjective:  Patient seen in follow-up for UGIB. He is s/p EGD yesterday which showed 3 columns of esophageal varices (one with red wale sign) s/p EVL x2. No bleeding after procedure. Large amount of food was seen within gastric lumen. Postoperatively patient complained of chest pain and cardiology consult was placed. No new complaints today. No further episodes of bleeding. He is tolerating liquid diet and requesting to eat real food. Hemoglobin 8.4.   Scheduled Inpatient Medications:   ezetimibe  10 mg Oral Daily   ferrous sulfate  325 mg Oral Q breakfast   folic acid  1 mg Oral Daily   gabapentin  300 mg Oral TID   hydrocortisone  20 mg Oral Daily   lactulose  20 g Oral BID   midodrine  10 mg Oral TID WC   multivitamin with minerals  1 tablet Oral Daily   nicotine  21 mg Transdermal Daily   octreotide  50 mcg Intravenous Once   pantoprazole  40 mg Oral BID   QUEtiapine  25 mg Oral QHS   rifaximin  550 mg Oral BID   rosuvastatin  40 mg Oral Daily   sodium chloride flush  10-40 mL Intracatheter Q12H   thiamine  100 mg Oral Daily   Or   thiamine  100 mg Intravenous Daily    Continuous Inpatient Infusions:    sodium chloride Stopped (02/23/23 1806)   furosemide (LASIX) 200 mg in dextrose 5 % 100 mL (2 mg/mL) infusion 4 mg/hr (03/01/23 2351)   lactated ringers     octreotide (SANDOSTATIN) 500 mcg in sodium chloride 0.9 % 250 mL (2 mcg/mL) infusion 50 mcg/hr (03/02/23 0432)    PRN Inpatient Medications:  acetaminophen **OR** acetaminophen, lidocaine, methocarbamol, metoCLOPramide (REGLAN) injection, nitroGLYCERIN, mouth rinse, oxyCODONE, sodium chloride flush, traZODone  Review of Systems: Constitutional: Weight is stable.  Eyes: No changes in vision. ENT: No oral lesions, sore throat.  GI: see HPI.  Heme/Lymph: No easy bruising.  CV: No chest pain.  GU: No hematuria.  Integumentary: No rashes.  Neuro: No headaches.  Psych: No  depression/anxiety.  Endocrine: No heat/cold intolerance.  Allergic/Immunologic: No urticaria.  Resp: No cough, SOB.  Musculoskeletal: No joint swelling.    Physical Examination: BP 119/67 (BP Location: Left Arm)   Pulse 85   Temp (!) 97.5 F (36.4 C)   Resp 16   Ht 5\' 5"  (1.651 m)   Wt 84.6 kg   SpO2 100%   BMI 31.04 kg/m  Gen: NAD, alert and oriented x 4 HEENT: PEERLA, EOMI, Neck: supple, no JVD or thyromegaly Chest: CTA bilaterally, no wheezes, crackles, or other adventitious sounds CV: RRR, no m/g/c/r Abd: soft, NT, ND, +BS in all four quadrants; no HSM, guarding, ridigity, or rebound tenderness Ext: no edema, well perfused with 2+ pulses, Skin: no rash or lesions noted Lymph: no LAD  Data: Lab Results  Component Value Date   WBC 5.8 03/02/2023   HGB 8.4 (L) 03/02/2023   HCT 26.8 (L) 03/02/2023   MCV 94.7 03/02/2023   PLT 98 (L) 03/02/2023   Recent Labs  Lab 02/28/23 0445 03/01/23 0340 03/02/23 0330  HGB 8.1* 7.9* 8.4*   Lab Results  Component Value Date   NA 137 03/02/2023   K 3.3 (L) 03/02/2023   CL 105 03/02/2023   CO2 24 03/02/2023   BUN 6 03/02/2023   CREATININE 0.62 03/02/2023   Lab Results  Component Value Date   ALT 30  03/02/2023   AST 61 (H) 03/02/2023   ALKPHOS 139 (H) 03/02/2023   BILITOT 1.3 (H) 03/02/2023   Recent Labs  Lab 03/02/23 0330  INR 1.3*    Assessment/Plan:  52 y/o Caucasian male with a PMH of polysubstance abuse (EtOH, tobacco, cocaine, marijuana), HTN, HLD, obesity, CAD, hx of STEMI c/b v-fib arrest, CHF, depression, and anxiety admitted 3/9 last week for GI Bleeding with hematemesis and melena with ABLA. He is s/p EGD 3/16 yesterday with Dr. Virgina Jock with findings of esophageal varices s/p EVL x2.   Upper GI bleed 2/2 esophageal varices - s/p EGD 3/16 with EVL x2 with good proximal decompression of varices. No signs of recurrent bleeding. H&H stable.  Decompensated cirrhosis of the liver - 2/2 EtOH  Hx of  polysubstance abuse   EtOH abuse  Electrolyte derangement   Adrenal insufficiency  CAD  Chronic systolic CHF  Anxiety and depression  Recommendations:  - H&H stable with no signs of recurrent GI bleeding - Continue to monitor serial H&H. Transfuse for Hgb <7.0.  - Continue Protonix BID - Continue Octreotide gtt for total of 72 hours - Continue Ceftriaxone for 7 days. Can transition to Ciprofloxacin 500 mg BID if discharged prior.  - Defer initiation of Nadolol given hx of noncompliance and polysubstance abuse - He would benefit from TIPS evaluation  - ADAT - Will need repeat EGD in 2-weeks with primary gastroenterologist - [facility]- Dr. Marius Ditch- if he does not receive TIPS - GI will continue to follow along with you - CIWA protocol - Discussed importance of alcohol cessation  Please call with questions or concerns.   Octavia Bruckner, PA-C [facility] [phone]

## 2023-03-02 NOTE — Progress Notes (Signed)
[facility] Progress Note  [name]    [id number]  DOA: 02/22/2023     Date of Service: the patient was seen and examined on 03/02/2023  Chief Complaint  Patient presents with   GI Problem   Rectal Bleeding   Brief hospital course: Ethan Wells is a 52 y.o. male with medical history significant of polysubstance abuse (alcohol, tobacco, cocaine, marijuana), GI bleeding, hypertension, hyperlipidemia, CAD, STEMI with BMS placement, CHF, depression with anxiety, adrenal insufficiency, cardiac arrest due to V-fib in the setting of STEMI, who presents with GI bleeding.    Patient was recently hospitalized from 2/1 - 2/16 due to GI bleeding related hemorrhagic shock. Pt states that he started having coffee-ground hematemesis again since yesterday.  He states that it totally he has had more than 20 times of hematemesis.  He also has black tarry stool.  Patient has nausea and abdominal pain.  His abdominal pain is diffuse, constant, aching, mild to moderate, nonradiating.  No fever or chills.  He has mild shortness breath and chest discomfort, no cough.  Denies symptoms of UTI.  No confusion.  He reports possible syncope but he is uncertain about this.  No unilateral numbness or tinglings or weakness extremities.   Patient was found to be hypotensive initially, with blood pressure 74/56, which improved to 92/53 after giving 3 L LR in ED.  ED w/up: Hb 10.7 on 01/29/2023 --> 7.5, troponin 46, WBC 11.6, INR 1.6, PTT 33, temperature normal, heart rate 100-120s, RR 30 --> 14, oxygen saturation 98% on room air.   EKG: Sinus rhythm, QTc 471, LAE, early R wave progression, frequent PVC.   Patient is admitted to stepdown as inpatient.  Dr. Marius Ditch of GI is consulted.   Assessment and Plan: Principal Problem:   GI bleeding Active Problems:   Acute blood loss anemia   Adrenal insufficiency (HCC)   CAD (coronary artery disease)   Myocardial injury   Hypomagnesemia   Chronic  systolic CHF (congestive heart failure) (HCC)   HLD (hyperlipidemia)   Thrombocytopenia (HCC)   Essential hypertension   Depression with anxiety   Polysubstance abuse ([place])   Morbid obesity ([place])   # Acute GI bleeding # Acute blood loss anemia Hb 7.1--8.1--7.4--7.8--8.4--8.1--8.4 S/p 2 unit of PRBC transfusion Anemia workup within normal range S/p IV ceftriaxone x 7 days for SBP prophylaxis S/p IV fluid bolus, vital signs stable S/p pantoprazole IV infusion, switch to pantoprazole IV push and then pantoprazole 40 mg p.o. twice daily as patient lost IV on 3/14 S/p octreotide IV infusion DC'd on 3/14 Continue Zofran as needed Monitor H&H and transfuse if hemoglobin less than 7 GI consulted, EGD could not be done due to urine tox positive for cocaine. 3/12 UDS positive for cocaine and benzo, GI postponed EGD for now. 3/13 UDS positive for cocaine, GI recommended to repeat UDS on Friday 3/15 UDS negative for cocaine, plan for EGD tomorrow a.m. 3/16 s/p EGD, esophageal varices, banding done by GI.  Recommended not a candidate for beta-blocker due to cocaine abuse and patient will benefit from TIPS procedure due to recurrent variceal bleeding. Octreotide bolus was given and started on infusion for 72 hours, no need of antibiotics, patient ready completed ceftriaxone for 7 days. Patient is not ready for TIPS procedure at this time, would like to be discharged home and repeat EGD after 2 months and then he will decide for TIPS procedure.   # Liver cirrhosis due to alcohol abuse Continue rifaximin  550 mg p.o. twice daily for hepatic encephalopathy, need to continue long-term on discharge Continue lactulose Ammonia 44--56--47--50 3/15 [place] abd Trace ascites, no safe window for paracentesis. paracentesis was not performed. 3/15 KUB No evidence of bowel obstruction.  S/p GI as above, GI recommended TIPS procedure by IR, patient needs to be [facility].  # EtOH abuse,  withdrawal symptoms Continue CIWA protocol EtOH abstinence counseling done   # Hypokalemia, potassium repleted # Hypophosphatemia, Phos repleted. # Hypomagnesemia, mag repleted. Monitor electrolytes and replete as needed  # Adrenal insufficiency (HCC) -Give Solu-Cortef 100 mg x 2 doses as stress doses -Continue home Cortef 20 mg daily  -Continue home midodrine 10 mg 3 times daily for hypotension  CAD (coronary artery disease) and myocardial injury: Troponin 46.  History of STEMI with BMS placement. -Will not give aspirin due to GI bleeding.  Continue Crestor and Zetia Beta-blocker was previously held in the setting of chronic cocaine use -Troponin 172--101 trended down -A1c 5.6, 3/16 discussed with cardiology, as patient had chest pain after endoscopy and variceal banding.  Chronic systolic CHF (congestive heart failure) [facility]): Patient had history of systolic CHF.  2D echo on 07/13/2019 showed EF of 45-50%.  His EF has improved in recent years.  2D echo on 01/17/2023 showed EF of 50-55%. Held diuretics on admission due to hypotension. -BNP 207 3/16 developed lower extremity edema could be due to liver cirrhosis, hypoalbuminemia and anemia.  Started Lasix IV infusion due to anasarca    HLD (hyperlipidemia) -Crestor, Zetia Lipid profile LDL 62, triglyceride 53, within normal range  Thrombocytopenia ([place]): This is a chronic issue.  Platelet 103 on 11/21/2022, 158 on 01/29/2023.  platelet 108--50.  Likely due to alcohol abuse and possible liver cirrhosis. -Follow-up with CBC   Essential hypertension -Hold off blood pressure medications and diuretics due to hypotension -Hold Imdur nitroglycerin   Depression with anxiety -Seroquel   Polysubstance abuse: Including cocaine, tobacco, alcohol, marijuana -Did counseling about importance of quitting substance use -Nicotine patch -s/p CIWA protocol -UDS positive for cocaine    Obesity, Body mass index is 31.04 kg/m.  Interventions:  -Healthy diet and exercise -Encouraged losing weight      Diet: Soft diet DVT Prophylaxis: SCD, pharmacological prophylaxis contraindicated due to GI bleeding    Advance goals of care discussion: Full code  Family Communication: family was NOT present at bedside, at the time of interview.  The pt provided permission to discuss medical plan with the family. Opportunity was given to ask question and all questions were answered satisfactorily.   Disposition:  Pt is from Home, admitted with GI bleeding, s/p IV PPI and octreotide, s/p EGD/banding, started IV octreotide for 72 hours, started Lasix IV infusion, which precludes a safe discharge. Discharge to home after few days, depends on improvement in the edema and need to transition Lasix to oral   Subjective: No significant events overnight, patient's epigastric and lower chest pain resolved, he stated it is doing fine and he would like to eat real food, so diet was advanced.  Patient is making enough urine, feels improvement in the edema also.   Physical Exam: General: NAD, sitting comfortably on the bed  Appear in no distress, affect appropriate Eyes: PERRLA ENT: Oral Mucosa Clear, moist  Neck: no JVD,  Cardiovascular: S1 and S2 Present, no Murmur,  Respiratory: good respiratory effort, Bilateral Air entry equal and Decreased, no Crackles, no wheezes Abdomen: Bowel Sound present, Soft, distended and mild tenderness,  Skin: no rashes Extremities: 3-4+ Pedal edema, no calf tenderness Neurologic: without any new focal findings Gait not checked due to patient safety concerns  Vitals:   03/01/23 1608 03/01/23 1928 03/02/23 0412 03/02/23 0731  BP: 121/66 108/64 105/63 119/67  Pulse: 80 71 91 85  Resp: 16 18 18 16   Temp: 97.8 F (36.6 C) 97.8 F (36.6 C) 97.8 F (36.6 C) (!) 97.5 F (36.4 C)  TempSrc: Oral     SpO2: 99% 97% 99% 100%  Weight:      Height:        Intake/Output Summary (Last 24 hours) at 03/02/2023 1343 Last data  filed at 03/02/2023 1100 Gross per 24 hour  Intake 572.03 ml  Output 5000 ml  Net -4427.97 ml   Filed Weights   02/22/23 0905  Weight: 84.6 kg    Data Reviewed: I have personally reviewed and interpreted daily labs, tele strips, imagings as discussed above. I reviewed all nursing notes, pharmacy notes, vitals, pertinent old records I have discussed plan of care as described above with RN and patient/family.  CBC: Recent Labs  Lab 02/26/23 0519 02/27/23 0558 02/28/23 0445 03/01/23 0340 03/02/23 0330  WBC 5.4 5.5 5.9 5.3 5.8  HGB 8.4* 8.1* 8.1* 7.9* 8.4*  HCT 27.0* 25.7* 25.9* 25.3* 26.8*  MCV 95.7 94.8 95.9 95.5 94.7  PLT 55* 58* 77* 82* 98*   Basic Metabolic Panel: Recent Labs  Lab 02/26/23 0519 02/27/23 0558 02/28/23 0445 03/01/23 0340 03/02/23 0330  NA 139 136 140 139 137  K 3.8 4.0 4.1 3.9 3.3*  CL 114* 112* 110 110 105  CO2 21* 20* 20* 24 24  GLUCOSE 139* 112* 108* 111* 168*  BUN <5* 5* 7 6 6   CREATININE 0.65 0.58* 0.57* 0.56* 0.62  CALCIUM 7.9* 8.0* 8.5* 8.3* 8.4*  MG 1.9 1.8 1.8 1.7 1.7  PHOS 2.7 2.6 2.7 3.1 3.1    Studies: No results found.  Scheduled Meds:  ezetimibe  10 mg Oral Daily   ferrous sulfate  325 mg Oral Q breakfast   folic acid  1 mg Oral Daily   gabapentin  300 mg Oral TID   hydrocortisone  20 mg Oral Daily   lactulose  20 g Oral BID   midodrine  10 mg Oral TID WC   multivitamin with minerals  1 tablet Oral Daily   nicotine  21 mg Transdermal Daily   octreotide  50 mcg Intravenous Once   pantoprazole  40 mg Oral BID   QUEtiapine  25 mg Oral QHS   rifaximin  550 mg Oral BID   rosuvastatin  40 mg Oral Daily   sodium chloride flush  10-40 mL Intracatheter Q12H   thiamine  100 mg Oral Daily   Or   thiamine  100 mg Intravenous Daily   Continuous Infusions:  sodium chloride Stopped (02/23/23 1806)   furosemide (LASIX) 200 mg in dextrose 5 % 100 mL (2 mg/mL) infusion 4 mg/hr (03/01/23 2351)   lactated ringers     octreotide  (SANDOSTATIN) 500 mcg in sodium chloride 0.9 % 250 mL (2 mcg/mL) infusion 50 mcg/hr (03/02/23 0432)   PRN Meds: acetaminophen **OR** acetaminophen, lidocaine, methocarbamol, metoCLOPramide (REGLAN) injection, nitroGLYCERIN, mouth rinse, oxyCODONE, sodium chloride flush, traZODone  Time spent: 35 minutes  Author: Val Riles. MD [facility] 03/02/2023 1:43 PM  To reach On-call, see care teams to locate the attending and reach out to them via [email or web address]. If 7PM-7AM, please contact night-coverage If you still have  difficulty reaching the attending provider, please page the [facility] (Director on Call) for [facility] on amion for assistance.

## 2023-03-02 NOTE — Progress Notes (Signed)
Rounding Note    Patient Name: Ethan Wells Date of Encounter: 03/02/2023  [place] Cardiologist: Lauree Chandler, MD   Subjective   No further chest pain reported. Breathing is stable.   Inpatient Medications    Scheduled Meds:  ezetimibe  10 mg Oral Daily   ferrous sulfate  325 mg Oral Q breakfast   folic acid  1 mg Oral Daily   gabapentin  300 mg Oral TID   hydrocortisone  20 mg Oral Daily   lactulose  20 g Oral BID   midodrine  10 mg Oral TID WC   multivitamin with minerals  1 tablet Oral Daily   nicotine  21 mg Transdermal Daily   octreotide  50 mcg Intravenous Once   pantoprazole  40 mg Oral BID   QUEtiapine  25 mg Oral QHS   rifaximin  550 mg Oral BID   rosuvastatin  40 mg Oral Daily   sodium chloride flush  10-40 mL Intracatheter Q12H   thiamine  100 mg Oral Daily   Or   thiamine  100 mg Intravenous Daily   Continuous Infusions:  sodium chloride Stopped (02/23/23 1806)   furosemide (LASIX) 200 mg in dextrose 5 % 100 mL (2 mg/mL) infusion 4 mg/hr (03/01/23 2351)   lactated ringers     octreotide (SANDOSTATIN) 500 mcg in sodium chloride 0.9 % 250 mL (2 mcg/mL) infusion 50 mcg/hr (03/02/23 0432)   PRN Meds: acetaminophen **OR** acetaminophen, lidocaine, methocarbamol, metoCLOPramide (REGLAN) injection, nitroGLYCERIN, mouth rinse, oxyCODONE, sodium chloride flush, traZODone   Vital Signs    Vitals:   03/01/23 1608 03/01/23 1928 03/02/23 0412 03/02/23 0731  BP: 121/66 108/64 105/63 119/67  Pulse: 80 71 91 85  Resp: 16 18 18 16   Temp: 97.8 F (36.6 C) 97.8 F (36.6 C) 97.8 F (36.6 C) (!) 97.5 F (36.4 C)  TempSrc: Oral     SpO2: 99% 97% 99% 100%  Weight:      Height:        Intake/Output Summary (Last 24 hours) at 03/02/2023 1001 Last data filed at 03/02/2023 0800 Gross per 24 hour  Intake 572.03 ml  Output 4600 ml  Net -4027.97 ml      02/22/2023    9:05 AM 01/20/2023    1:30 AM 01/17/2023    4:30 AM  Last 3 Weights   Weight (lbs) 186 lb 8.2 oz 229 lb 11.5 oz 224 lb 3.3 oz  Weight (kg) 84.6 kg 104.2 kg 101.7 kg      Telemetry    NSR HR 80s - Personally Reviewed  ECG    No new - Personally Reviewed  Physical Exam   GEN: No acute distress.   Neck: No JVD Cardiac: RRR, no murmurs, rubs, or gallops.  Respiratory: Clear to auscultation bilaterally. GI: Soft, nontender, non-distended  MS: + left lower leg edema; No deformity. Neuro:  Nonfocal  Psych: Normal affect   Labs    High Sensitivity Troponin:   Recent Labs  Lab 02/23/23 1713 02/23/23 1924 02/23/23 2142 02/24/23 0132 03/01/23 0914  TROPONINIHS 98* 84* 120* 143* 23*     Chemistry Recent Labs  Lab 02/28/23 0445 03/01/23 0340 03/02/23 0330  NA 140 139 137  K 4.1 3.9 3.3*  CL 110 110 105  CO2 20* 24 24  GLUCOSE 108* 111* 168*  BUN 7 6 6   CREATININE 0.57* 0.56* 0.62  CALCIUM 8.5* 8.3* 8.4*  MG 1.8 1.7 1.7  PROT 5.7* 5.5* 6.7  ALBUMIN 2.5* 2.5* 2.9*  AST 44* 61* 61*  ALT 21 26 30   ALKPHOS 129* 134* 139*  BILITOT 1.3* 1.1 1.3*  GFRNONAA >60 >60 >60  ANIONGAP 10 5 8     Lipids No results for input(s): "CHOL", "TRIG", "HDL", "LABVLDL", "LDLCALC", "CHOLHDL" in the last 168 hours.  Hematology Recent Labs  Lab 02/28/23 0445 03/01/23 0340 03/02/23 0330  WBC 5.9 5.3 5.8  RBC 2.70* 2.65* 2.83*  HGB 8.1* 7.9* 8.4*  HCT 25.9* 25.3* 26.8*  MCV 95.9 95.5 94.7  MCH 30.0 29.8 29.7  MCHC 31.3 31.2 31.3  RDW 21.0* 21.2* 21.2*  PLT 77* 82* 98*   Thyroid No results for input(s): "TSH", "FREET4" in the last 168 hours.  BNPNo results for input(s): "BNP", "PROBNP" in the last 168 hours.  DDimer No results for input(s): "DDIMER" in the last 168 hours.   Radiology    DG Abd 1 View  Result Date: 02/28/2023 CLINICAL DATA:  Abdominal distension EXAM: ABDOMEN - 1 VIEW COMPARISON:  Radiograph 01/18/2023 FINDINGS: Nonobstructive bowel gas pattern. No radiopaque calculi overlie the kidneys. There is distension of the stomach with  presumably ingested material. IMPRESSION: No evidence of bowel obstruction. Electronically Signed   By: Maurine Simmering M.D.   On: 02/28/2023 16:17   [place] ASCITES (ABDOMEN LIMITED)  Result Date: 02/28/2023 CLINICAL DATA:  52 year old male with history of ascites presenting for paracentesis. EXAM: LIMITED ABDOMEN ULTRASOUND FOR ASCITES TECHNIQUE: Limited ultrasound survey for ascites was performed in all four abdominal quadrants. COMPARISON:  None Available. FINDINGS: Trace perihepatic ascites. Trace ascites in the left lower quadrant no ascites in the right lower and left upper quadrants. IMPRESSION: Trace ascites, no safe window for paracentesis. No paracentesis was performed. Ruthann Cancer, MD [facility] Electronically Signed   By: Ruthann Cancer M.D.   On: 02/28/2023 15:15    Cardiac Studies   Echo 01/2023  1. Left ventricular ejection fraction, by estimation, is 50 to 55%. The  left ventricle has low normal function. The left ventricle has no regional  wall motion abnormalities. Left ventricular diastolic parameters are  indeterminate.   2. Right ventricular systolic function is normal. The right ventricular  size is normal.   3. The mitral valve is normal in structure. Mild mitral valve  regurgitation. No evidence of mitral stenosis.   4. The aortic valve is normal in structure. Aortic valve regurgitation is  not visualized. No aortic stenosis is present.   5. The inferior vena cava is normal in size with greater than 50%  respiratory variability, suggesting right atrial pressure of 3 mmHg.    Cardiac Cath 10/2022    Mid Cx lesion is 100% stenosed.   Ramus lesion is 85% stenosed.   Prox LAD to Mid LAD lesion is 5% stenosed.   Prox RCA to Mid RCA lesion is 100% stenosed.   Prox RCA lesion is 80% stenosed.   There is mild left ventricular systolic dysfunction.   LV end diastolic pressure is moderately elevated.   The left  ventricular ejection fraction is 45-50% by visual estimate.   1.  Severe two-vessel coronary artery disease with chronically occluded mid left circumflex and mid right coronary artery with collaterals.  These vessels were patent with mild disease on previous cardiac catheterization in 2012.  LAD stent is patent with minimal restenosis.  In addition, there is progression of proximal to mid ramus stenosis. 2.  Mildly reduced LV systolic function with an EF of 45%.  3.  Moderately elevated left ventricular end-diastolic pressure at 26 mmHg.   Recommendations: The patient has 2 chronic occlusions with collaterals.  Recommend medical therapy for now.  I added small dose Imdur.  Given ongoing rectal bleeding, risks of PCI outweigh the benefit at this time.  In the near future, once his bleeding issues are controlled and if he continues to have anginal symptoms, PCI of the ramus and possibly the right coronary artery can be considered.  The RCA has a microchannel of patent flow. Continue IV furosemide today given elevated LVEDP and switch to oral likely tomorrow.    Patient Profile     52 y.o. male with a hx of CAD s/p STEMI and VF arrest in 01/2011 with LAD stenting, NSTEMI 10/2022, HTN, HL, ICM w/ improved Ef, polysubstance abuse, hemorrhoidal bleeding w/ anemia and mesenteric dz s/p SMA stenting who is being seen 03/01/2023 for the evaluation of chest pain   Assessment & Plan    Chest pain CAD -History of LAD stenting in the setting of STEMI/VF arrest in 2012.  Recent non-STEMI November 2023 with findings of patent LAD stent and occluded left circumflex and RCA with collaterals from LAD filling both distal territories - patient reported chest pain in the setting of EGD with banding - HS troponin 23, continue to trend troponin - EKG nonischemic - Echo showed 01/2023 showed LVEF 50-55%, no WMA, mild MR.  - no plan for repeat echo - PTA Imdur 30mg  daily, Zetia, Crestor 40mg  daily - No BB with cocaine  use.  - he is on midodrine 10mg  TID, Imdur held - chest pain improved with fentanyl. Suspect chest pain is mostly GI related. No plan for further ischemic evaluation. Restart PTA meds as able   Chronic HFpEF LLE - Most recent echo 01/2023 showed LVEF 50-55% - albumin 2.9 - kidney function stable - PTA lasix 40mg  daily. Med list also mentions torsemide - started on IV lasix drip - UOP -4.1L - kidney function stable  For questions or updates, please contact [place] Please consult [email or web address] for contact info under        Signed, Kalei Meda Ninfa Meeker, PA-C  03/02/2023, 10:01 AM

## 2023-03-03 ENCOUNTER — Encounter: Payer: Self-pay | Admitting: Gastroenterology

## 2023-03-03 DIAGNOSIS — K922 Gastrointestinal hemorrhage, unspecified: Secondary | ICD-10-CM | POA: Diagnosis not present

## 2023-03-03 DIAGNOSIS — I5022 Chronic systolic (congestive) heart failure: Secondary | ICD-10-CM | POA: Diagnosis not present

## 2023-03-03 LAB — HEPATIC FUNCTION PANEL
ALT: 24 U/L (ref 0–44)
AST: 44 U/L — ABNORMAL HIGH (ref 15–41)
Albumin: 2.5 g/dL — ABNORMAL LOW (ref 3.5–5.0)
Alkaline Phosphatase: 117 U/L (ref 38–126)
Bilirubin, Direct: 0.4 mg/dL — ABNORMAL HIGH (ref 0.0–0.2)
Indirect Bilirubin: 0.8 mg/dL (ref 0.3–0.9)
Total Bilirubin: 1.2 mg/dL (ref 0.3–1.2)
Total Protein: 6.1 g/dL — ABNORMAL LOW (ref 6.5–8.1)

## 2023-03-03 LAB — BASIC METABOLIC PANEL
Anion gap: 8 (ref 5–15)
BUN: 12 mg/dL (ref 6–20)
CO2: 30 mmol/L (ref 22–32)
Calcium: 8.3 mg/dL — ABNORMAL LOW (ref 8.9–10.3)
Chloride: 101 mmol/L (ref 98–111)
Creatinine, Ser: 0.63 mg/dL (ref 0.61–1.24)
GFR, Estimated: >60 mL/min (ref >60)
Glucose, Bld: 137 mg/dL — ABNORMAL HIGH (ref 70–99)
Potassium: 3.3 mmol/L — ABNORMAL LOW (ref 3.5–5.1)
Sodium: 139 mmol/L (ref 135–145)

## 2023-03-03 LAB — PROTIME-INR
INR: 1.4 — ABNORMAL HIGH (ref 0.8–1.2)
Prothrombin Time: 17.1 seconds — ABNORMAL HIGH (ref 11.4–15.2)

## 2023-03-03 LAB — CBC
HCT: 24.9 % — ABNORMAL LOW (ref 39.0–52.0)
Hemoglobin: 7.8 g/dL — ABNORMAL LOW (ref 13.0–17.0)
MCH: 29.5 pg (ref 26.0–34.0)
MCHC: 31.3 g/dL (ref 30.0–36.0)
MCV: 94.3 fL (ref 80.0–100.0)
Platelets: 104 10*3/uL — ABNORMAL LOW (ref 150–400)
RBC: 2.64 MIL/uL — ABNORMAL LOW (ref 4.22–5.81)
RDW: 21.1 % — ABNORMAL HIGH (ref 11.5–15.5)
WBC: 6.4 10*3/uL (ref 4.0–10.5)
nRBC: 0 % (ref 0.0–0.2)

## 2023-03-03 MED ORDER — POTASSIUM CHLORIDE CRYS ER 20 MEQ PO TBCR
40.0000 meq | EXTENDED_RELEASE_TABLET | Freq: Once | ORAL | Status: AC
  Administered 2023-03-03: 40 meq via ORAL
  Filled 2023-03-03: qty 2

## 2023-03-03 NOTE — Progress Notes (Signed)
Rounding Note    Patient Name: Ethan Wells Date of Encounter: 03/03/2023  [place] Cardiologist: Lauree Chandler, MD   Subjective   Breathing stable, largely unchanged throughout the admission.  No chest pain.  Lower extremity swelling improved.  Requesting breathing treatments and narcotic pain medication.  Documented urine output 3.7 L for the past 24 hours, remains net +1.7 L for the admission.  Inpatient Medications    Scheduled Meds:  ezetimibe  10 mg Oral Daily   ferrous sulfate  325 mg Oral Q breakfast   folic acid  1 mg Oral Daily   gabapentin  300 mg Oral TID   hydrocortisone  20 mg Oral Daily   lactulose  20 g Oral BID   midodrine  10 mg Oral TID WC   multivitamin with minerals  1 tablet Oral Daily   nicotine  21 mg Transdermal Daily   octreotide  50 mcg Intravenous Once   pantoprazole  40 mg Oral BID   potassium chloride  40 mEq Oral Once   QUEtiapine  25 mg Oral QHS   rifaximin  550 mg Oral BID   rosuvastatin  40 mg Oral Daily   sodium chloride flush  10-40 mL Intracatheter Q12H   thiamine  100 mg Oral Daily   Or   thiamine  100 mg Intravenous Daily   Continuous Infusions:  sodium chloride Stopped (02/23/23 1806)   furosemide (LASIX) 200 mg in dextrose 5 % 100 mL (2 mg/mL) infusion 4 mg/hr (03/03/23 0303)   lactated ringers     octreotide (SANDOSTATIN) 500 mcg in sodium chloride 0.9 % 250 mL (2 mcg/mL) infusion 50 mcg/hr (03/03/23 0303)   PRN Meds: acetaminophen **OR** acetaminophen, lidocaine, methocarbamol, metoCLOPramide (REGLAN) injection, nitroGLYCERIN, mouth rinse, oxyCODONE, sodium chloride flush, traZODone   Vital Signs    Vitals:   03/02/23 1707 03/02/23 1926 03/03/23 0456 03/03/23 0837  BP: (!) 104/57 101/65 (!) 93/50 (!) 107/59  Pulse: 64 80 65 72  Resp: 18 20 17 18   Temp: 97.7 F (36.5 C) 97.6 F (36.4 C) (!) 97.5 F (36.4 C) 98.6 F (37 C)  TempSrc: Oral     SpO2: 99% 100% 99% 96%  Weight:      Height:         Intake/Output Summary (Last 24 hours) at 03/03/2023 0955 Last data filed at 03/03/2023 0630 Gross per 24 hour  Intake 900.43 ml  Output 3300 ml  Net -2399.57 ml       02/22/2023    9:05 AM 01/20/2023    1:30 AM 01/17/2023    4:30 AM  Last 3 Weights  Weight (lbs) 186 lb 8.2 oz 229 lb 11.5 oz 224 lb 3.3 oz  Weight (kg) 84.6 kg 104.2 kg 101.7 kg      Telemetry    SR HR 80s with occasional PVCs- Personally Reviewed  ECG    No new tracings- Personally Reviewed  Physical Exam   GEN: No acute distress.   Neck: No JVD Cardiac: RRR, no murmurs, rubs, or gallops.  Respiratory: Mildly diminished along the bases bilaterally. GI: Soft, nontender, non-distended  MS: Trace bilateral lower extremity edema; No deformity. Neuro:  Nonfocal  Psych: Normal affect   Labs    High Sensitivity Troponin:   Recent Labs  Lab 02/23/23 1713 02/23/23 1924 02/23/23 2142 02/24/23 0132 03/01/23 0914  TROPONINIHS 98* 84* 120* 143* 23*      Chemistry Recent Labs  Lab 02/28/23 0445 03/01/23 0340 03/02/23 0330  03/03/23 0340  NA 140 139 137 139  K 4.1 3.9 3.3* 3.3*  CL 110 110 105 101  CO2 20* 24 24 30   GLUCOSE 108* 111* 168* 137*  BUN 7 6 6 12   CREATININE 0.57* 0.56* 0.62 0.63  CALCIUM 8.5* 8.3* 8.4* 8.3*  MG 1.8 1.7 1.7  --   PROT 5.7* 5.5* 6.7 6.1*  ALBUMIN 2.5* 2.5* 2.9* 2.5*  AST 44* 61* 61* 44*  ALT 21 26 30 24   ALKPHOS 129* 134* 139* 117  BILITOT 1.3* 1.1 1.3* 1.2  GFRNONAA >60 >60 >60 >60  ANIONGAP 10 5 8 8      Lipids No results for input(s): "CHOL", "TRIG", "HDL", "LABVLDL", "LDLCALC", "CHOLHDL" in the last 168 hours.  Hematology Recent Labs  Lab 03/01/23 0340 03/02/23 0330 03/03/23 0340  WBC 5.3 5.8 6.4  RBC 2.65* 2.83* 2.64*  HGB 7.9* 8.4* 7.8*  HCT 25.3* 26.8* 24.9*  MCV 95.5 94.7 94.3  MCH 29.8 29.7 29.5  MCHC 31.2 31.3 31.3  RDW 21.2* 21.2* 21.1*  PLT 82* 98* 104*    Thyroid No results for input(s): "TSH", "FREET4" in the last 168 hours.  BNPNo  results for input(s): "BNP", "PROBNP" in the last 168 hours.  DDimer No results for input(s): "DDIMER" in the last 168 hours.   Radiology    No results found.  Cardiac Studies   Echo 01/2023  1. Left ventricular ejection fraction, by estimation, is 50 to 55%. The  left ventricle has low normal function. The left ventricle has no regional  wall motion abnormalities. Left ventricular diastolic parameters are  indeterminate.   2. Right ventricular systolic function is normal. The right ventricular  size is normal.   3. The mitral valve is normal in structure. Mild mitral valve  regurgitation. No evidence of mitral stenosis.   4. The aortic valve is normal in structure. Aortic valve regurgitation is  not visualized. No aortic stenosis is present.   5. The inferior vena cava is normal in size with greater than 50%  respiratory variability, suggesting right atrial pressure of 3 mmHg.    Cardiac Cath 10/2022    Mid Cx lesion is 100% stenosed.   Ramus lesion is 85% stenosed.   Prox LAD to Mid LAD lesion is 5% stenosed.   Prox RCA to Mid RCA lesion is 100% stenosed.   Prox RCA lesion is 80% stenosed.   There is mild left ventricular systolic dysfunction.   LV end diastolic pressure is moderately elevated.   The left ventricular ejection fraction is 45-50% by visual estimate.   1.  Severe two-vessel coronary artery disease with chronically occluded mid left circumflex and mid right coronary artery with collaterals.  These vessels were patent with mild disease on previous cardiac catheterization in 2012.  LAD stent is patent with minimal restenosis.  In addition, there is progression of proximal to mid ramus stenosis. 2.  Mildly reduced LV systolic function with an EF of 45%. 3.  Moderately elevated left ventricular end-diastolic pressure at 26 mmHg.   Recommendations: The patient has 2 chronic occlusions with collaterals.  Recommend medical therapy for now.  I added small dose Imdur.   Given ongoing rectal bleeding, risks of PCI outweigh the benefit at this time.  In the near future, once his bleeding issues are controlled and if he continues to have anginal symptoms, PCI of the ramus and possibly the right coronary artery can be considered.  The RCA has a microchannel of patent flow. Continue  IV furosemide today given elevated LVEDP and switch to oral likely tomorrow.    Patient Profile     52 y.o. male with a hx of CAD s/p STEMI and VF arrest in 01/2011 with LAD stenting, NSTEMI 10/2022, HTN, HL, ICM w/ improved Ef, polysubstance abuse, hemorrhoidal bleeding w/ anemia and mesenteric dz s/p SMA stenting who is being seen 03/01/2023 for the evaluation of chest pain   Assessment & Plan    Chest pain CAD -History of LAD stenting in the setting of STEMI/VF arrest in 2012.  Recent non-STEMI November 2023 with findings of patent LAD stent and occluded left circumflex and RCA with collaterals from LAD filling both distal territories.  Medical management advised. - Patient reported atypical chest pain in the setting of EGD with banding - High-sensitivity troponin 23, not consistent with ACS - No indication for heparin drip - EKG nonischemic - Echo showed 01/2023 showed LVEF 50-55%, no WMA, mild MR.  - PTA Imdur 30mg  held with midodrine use - Zetia, Crestor 40mg  daily - No plan for further ischemic evaluation. Restart PTA meds as able   Chronic HFpEF - Most recent echo 01/2023 showed LVEF 50-55% - Likely exacerbated by third spacing with albumin 2.5 and anemia with hemoglobin 7.8 - Remains on on IV lasix drip given he remains net positive for the admission, can likely transition over the next 24 hours to oral therapy - Consider addition of SGLT2 inhibitor and MRA prior to discharge/in the outpatient setting to optimize GDMT - Kidney function starting to trend upwards, monitor Lasix drip   For questions or updates, please contact [place] Please consult [email or web address]  for contact info under        Signed, Christell Faith, PA-C  03/03/2023, 9:55 AM

## 2023-03-03 NOTE — Care Management Important Message (Signed)
Important Message  Patient Details  Name: Ethan Wells MRN: [id number] Date of Birth: 14-Apr-1971   Medicare Important Message Given:  Yes     [name] 03/03/2023, 1:24 PM

## 2023-03-03 NOTE — Progress Notes (Signed)
[facility] Progress Note  [name]    [id number]  DOA: 02/22/2023     Date of Service: the patient was seen and examined on 03/03/2023  Chief Complaint  Patient presents with   GI Problem   Rectal Bleeding   Brief hospital course: Ethan Wells is a 52 y.o. male with medical history significant of polysubstance abuse (alcohol, tobacco, cocaine, marijuana), GI bleeding, hypertension, hyperlipidemia, CAD, STEMI with BMS placement, CHF, depression with anxiety, adrenal insufficiency, cardiac arrest due to V-fib in the setting of STEMI, who presents with GI bleeding.    Patient was recently hospitalized from 2/1 - 2/16 due to GI bleeding related hemorrhagic shock. Pt states that he started having coffee-ground hematemesis again since yesterday.  He states that it totally he has had more than 20 times of hematemesis.  He also has black tarry stool.  Patient has nausea and abdominal pain.  His abdominal pain is diffuse, constant, aching, mild to moderate, nonradiating.  No fever or chills.  He has mild shortness breath and chest discomfort, no cough.  Denies symptoms of UTI.  No confusion.  He reports possible syncope but he is uncertain about this.  No unilateral numbness or tinglings or weakness extremities.   Patient was found to be hypotensive initially, with blood pressure 74/56, which improved to 92/53 after giving 3 L LR in ED.  ED w/up: Hb 10.7 on 01/29/2023 --> 7.5, troponin 46, WBC 11.6, INR 1.6, PTT 33, temperature normal, heart rate 100-120s, RR 30 --> 14, oxygen saturation 98% on room air.   EKG: Sinus rhythm, QTc 471, LAE, early R wave progression, frequent PVC.   Patient is admitted to stepdown as inpatient.  Dr. Marius Ditch of GI is consulted.   Assessment and Plan: Principal Problem:   GI bleeding Active Problems:   Acute blood loss anemia   Adrenal insufficiency (HCC)   CAD (coronary artery disease)   Myocardial injury   Hypomagnesemia   Chronic  systolic CHF (congestive heart failure) (HCC)   HLD (hyperlipidemia)   Thrombocytopenia (HCC)   Essential hypertension   Depression with anxiety   Polysubstance abuse ([place])   Morbid obesity ([place])   # Acute GI bleeding # Acute blood loss anemia Hb 7.1--8.1--7.4--7.8--8.4--8.1--8.4--7.8 S/p 2 unit of PRBC transfusion Anemia workup within normal range S/p IV ceftriaxone x 7 days for SBP prophylaxis S/p IV fluid bolus, vital signs stable S/p pantoprazole IV infusion, switch to pantoprazole IV push and then pantoprazole 40 mg p.o. twice daily as patient lost IV on 3/14 S/p octreotide IV infusion DC'd on 3/14 Continue Zofran as needed Monitor H&H and transfuse if hemoglobin less than 7 GI consulted, EGD could not be done due to urine tox positive for cocaine. 3/12 UDS positive for cocaine and benzo, GI postponed EGD for now. 3/13 UDS positive for cocaine, GI recommended to repeat UDS on Friday 3/15 UDS negative for cocaine, plan for EGD tomorrow a.m. 3/16 s/p EGD, esophageal varices, banding done by GI.  Recommended not a candidate for beta-blocker due to cocaine abuse and patient will benefit from TIPS procedure due to recurrent variceal bleeding. Octreotide bolus was given and started on infusion for 72 hours, no need of antibiotics, patient ready completed ceftriaxone for 7 days. Patient is not ready for TIPS procedure at this time, would like to be discharged home and repeat EGD after 2 months and then he will decide for TIPS procedure.   # Liver cirrhosis due to alcohol abuse Continue rifaximin  550 mg p.o. twice daily for hepatic encephalopathy, need to continue long-term on discharge Continue lactulose Ammonia 44--56--47--50 3/15 [place] abd Trace ascites, no safe window for paracentesis. paracentesis was not performed. 3/15 KUB No evidence of bowel obstruction.  S/p EGD as above, GI recommended TIPS procedure by IR.   Patient would like to be discharged, repeat EGD after 2 months and  then he will plan for TIPS procedure, patient is not ready at this time.  # EtOH abuse, withdrawal symptoms Continue CIWA protocol EtOH abstinence counseling done   # Hypokalemia, potassium repleted # Hypophosphatemia, Phos repleted. # Hypomagnesemia, mag repleted. Monitor electrolytes and replete as needed  # Adrenal insufficiency (HCC) -Give Solu-Cortef 100 mg x 2 doses as stress doses -Continue home Cortef 20 mg daily  -Continue home midodrine 10 mg 3 times daily for hypotension  CAD (coronary artery disease) and myocardial injury: Troponin 46.  History of STEMI with BMS placement. -Will not give aspirin due to GI bleeding.  Continue Crestor and Zetia Beta-blocker was previously held in the setting of chronic cocaine use -Troponin 172--101 trended down -A1c 5.6, 3/16 discussed with cardiology, as patient had chest pain after endoscopy and variceal banding.  Chronic systolic CHF (congestive heart failure) [facility]): Patient had history of systolic CHF.  2D echo on 07/13/2019 showed EF of 45-50%.  His EF has improved in recent years.  2D echo on 01/17/2023 showed EF of 50-55%. Held diuretics on admission due to hypotension. -BNP 207 3/16 developed lower extremity edema could be due to liver cirrhosis, hypoalbuminemia and anemia.  Started Lasix IV infusion due to anasarca    HLD (hyperlipidemia) -Crestor, Zetia Lipid profile LDL 62, triglyceride 53, within normal range  Thrombocytopenia ([place]): This is a chronic issue.  Platelet 103 on 11/21/2022, 158 on 01/29/2023.  platelet 108--50.  Likely due to alcohol abuse and possible liver cirrhosis. -Follow-up with CBC   Essential hypertension -Hold off blood pressure medications and diuretics due to hypotension -Hold Imdur nitroglycerin    Depression with anxiety -Seroquel   Polysubstance abuse: Including cocaine, tobacco, alcohol, marijuana -Did counseling about importance of quitting substance use -Nicotine patch -s/p CIWA  protocol -UDS positive for cocaine    Obesity, Body mass index is 31.04 kg/m.  Interventions: -Healthy diet and exercise -Encouraged losing weight      Diet: Soft diet DVT Prophylaxis: SCD, pharmacological prophylaxis contraindicated due to GI bleeding    Advance goals of care discussion: Full code  Family Communication: family was NOT present at bedside, at the time of interview.  The pt provided permission to discuss medical plan with the family. Opportunity was given to ask question and all questions were answered satisfactorily.   Disposition:  Pt is from Home, admitted with GI bleeding, s/p IV PPI and octreotide, s/p EGD/banding, started IV octreotide for 72 hours, started Lasix IV infusion, which precludes a safe discharge. Discharge to home after few days, depends on improvement in the edema and need to transition Lasix to oral   Subjective: No significant events overnight, patient is feeling improvement in the lower extremity edema, making enough urine.  Patient still has generalized body ache, no any other active issues.   Physical Exam: General: NAD, sitting comfortably on the bed  Appear in no distress, affect appropriate Eyes: PERRLA ENT: Oral Mucosa Clear, moist  Neck: no JVD,  Cardiovascular: S1 and S2 Present, no Murmur,  Respiratory: good respiratory effort, Bilateral Air entry equal and Decreased, no Crackles, no wheezes Abdomen: Bowel Sound present,  Soft, distended and mild tenderness,  Skin: no rashes Extremities: 3-4+ Pedal edema, no calf tenderness.  Edema gradually improving Neurologic: without any new focal findings Gait not checked due to patient safety concerns  Vitals:   03/02/23 1707 03/02/23 1926 03/03/23 0456 03/03/23 0837  BP: (!) 104/57 101/65 (!) 93/50 (!) 107/59  Pulse: 64 80 65 72  Resp: 18 20 17 18   Temp: 97.7 F (36.5 C) 97.6 F (36.4 C) (!) 97.5 F (36.4 C) 98.6 F (37 C)  TempSrc: Oral     SpO2: 99% 100% 99% 96%  Weight:       Height:        Intake/Output Summary (Last 24 hours) at 03/03/2023 1432 Last data filed at 03/03/2023 1413 Gross per 24 hour  Intake 1620.43 ml  Output 4100 ml  Net -2479.57 ml   Filed Weights   02/22/23 0905  Weight: 84.6 kg    Data Reviewed: I have personally reviewed and interpreted daily labs, tele strips, imagings as discussed above. I reviewed all nursing notes, pharmacy notes, vitals, pertinent old records I have discussed plan of care as described above with RN and patient/family.  CBC: Recent Labs  Lab 02/27/23 0558 02/28/23 0445 03/01/23 0340 03/02/23 0330 03/03/23 0340  WBC 5.5 5.9 5.3 5.8 6.4  HGB 8.1* 8.1* 7.9* 8.4* 7.8*  HCT 25.7* 25.9* 25.3* 26.8* 24.9*  MCV 94.8 95.9 95.5 94.7 94.3  PLT 58* 77* 82* 98* [id number]*   Basic Metabolic Panel: Recent Labs  Lab 02/26/23 0519 02/27/23 0558 02/28/23 0445 03/01/23 0340 03/02/23 0330 03/03/23 0340  NA 139 136 140 139 137 139  K 3.8 4.0 4.1 3.9 3.3* 3.3*  CL 114* 112* 110 110 105 101  CO2 21* 20* 20* 24 24 30   GLUCOSE 139* 112* 108* 111* 168* 137*  BUN <5* 5* 7 6 6 12   CREATININE 0.65 0.58* 0.57* 0.56* 0.62 0.63  CALCIUM 7.9* 8.0* 8.5* 8.3* 8.4* 8.3*  MG 1.9 1.8 1.8 1.7 1.7  --   PHOS 2.7 2.6 2.7 3.1 3.1  --     Studies: No results found.  Scheduled Meds:  ezetimibe  10 mg Oral Daily   ferrous sulfate  325 mg Oral Q breakfast   folic acid  1 mg Oral Daily   gabapentin  300 mg Oral TID   hydrocortisone  20 mg Oral Daily   lactulose  20 g Oral BID   midodrine  10 mg Oral TID WC   multivitamin with minerals  1 tablet Oral Daily   nicotine  21 mg Transdermal Daily   octreotide  50 mcg Intravenous Once   pantoprazole  40 mg Oral BID   QUEtiapine  25 mg Oral QHS   rifaximin  550 mg Oral BID   rosuvastatin  40 mg Oral Daily   sodium chloride flush  10-40 mL Intracatheter Q12H   thiamine  100 mg Oral Daily   Or   thiamine  100 mg Intravenous Daily   Continuous Infusions:  sodium chloride Stopped  (02/23/23 1806)   furosemide (LASIX) 200 mg in dextrose 5 % 100 mL (2 mg/mL) infusion 4 mg/hr (03/03/23 0303)   lactated ringers     octreotide (SANDOSTATIN) 500 mcg in sodium chloride 0.9 % 250 mL (2 mcg/mL) infusion 50 mcg/hr (03/03/23 1208)   PRN Meds: acetaminophen **OR** acetaminophen, lidocaine, methocarbamol, metoCLOPramide (REGLAN) injection, nitroGLYCERIN, mouth rinse, oxyCODONE, sodium chloride flush, traZODone  Time spent: 35 minutes  Author: Val Riles. MD [facility] 03/03/2023  2:32 PM  To reach On-call, see care teams to locate the attending and reach out to them via [email or web address]. If 7PM-7AM, please contact night-coverage If you still have difficulty reaching the attending provider, please page the [facility] (Director on Call) for [facility] on amion for assistance.

## 2023-03-04 DIAGNOSIS — K922 Gastrointestinal hemorrhage, unspecified: Secondary | ICD-10-CM | POA: Diagnosis not present

## 2023-03-04 DIAGNOSIS — Z7189 Other specified counseling: Secondary | ICD-10-CM | POA: Diagnosis not present

## 2023-03-04 LAB — HEPATIC FUNCTION PANEL
ALT: 36 U/L (ref 0–44)
AST: 63 U/L — ABNORMAL HIGH (ref 15–41)
Albumin: 2.7 g/dL — ABNORMAL LOW (ref 3.5–5.0)
Alkaline Phosphatase: 114 U/L (ref 38–126)
Bilirubin, Direct: 0.4 mg/dL — ABNORMAL HIGH (ref 0.0–0.2)
Indirect Bilirubin: 0.6 mg/dL (ref 0.3–0.9)
Total Bilirubin: 1 mg/dL (ref 0.3–1.2)
Total Protein: 6.2 g/dL — ABNORMAL LOW (ref 6.5–8.1)

## 2023-03-04 LAB — CBC
HCT: 26.6 % — ABNORMAL LOW (ref 39.0–52.0)
Hemoglobin: 8.4 g/dL — ABNORMAL LOW (ref 13.0–17.0)
MCH: 30 pg (ref 26.0–34.0)
MCHC: 31.6 g/dL (ref 30.0–36.0)
MCV: 95 fL (ref 80.0–100.0)
Platelets: 100 10*3/uL — ABNORMAL LOW (ref 150–400)
RBC: 2.8 MIL/uL — ABNORMAL LOW (ref 4.22–5.81)
RDW: 20.4 % — ABNORMAL HIGH (ref 11.5–15.5)
WBC: 5.4 10*3/uL (ref 4.0–10.5)
nRBC: 0 % (ref 0.0–0.2)

## 2023-03-04 LAB — BASIC METABOLIC PANEL
Anion gap: 8 (ref 5–15)
BUN: 12 mg/dL (ref 6–20)
CO2: 29 mmol/L (ref 22–32)
Calcium: 8.4 mg/dL — ABNORMAL LOW (ref 8.9–10.3)
Chloride: 104 mmol/L (ref 98–111)
Creatinine, Ser: 0.62 mg/dL (ref 0.61–1.24)
GFR, Estimated: >60 mL/min (ref >60)
Glucose, Bld: 133 mg/dL — ABNORMAL HIGH (ref 70–99)
Potassium: 3.4 mmol/L — ABNORMAL LOW (ref 3.5–5.1)
Sodium: 141 mmol/L (ref 135–145)

## 2023-03-04 LAB — PROTIME-INR
INR: 1.3 — ABNORMAL HIGH (ref 0.8–1.2)
Prothrombin Time: 15.8 seconds — ABNORMAL HIGH (ref 11.4–15.2)

## 2023-03-04 MED ORDER — SPIRONOLACTONE 12.5 MG HALF TABLET
12.5000 mg | ORAL_TABLET | Freq: Every day | ORAL | Status: DC
  Administered 2023-03-04 – 2023-03-05 (×2): 12.5 mg via ORAL
  Filled 2023-03-04 (×2): qty 1

## 2023-03-04 MED ORDER — GLUCERNA SHAKE PO LIQD
237.0000 mL | Freq: Three times a day (TID) | ORAL | Status: DC
  Administered 2023-03-04 – 2023-03-05 (×3): 237 mL via ORAL

## 2023-03-04 MED ORDER — TORSEMIDE 20 MG PO TABS
40.0000 mg | ORAL_TABLET | Freq: Two times a day (BID) | ORAL | Status: DC
  Administered 2023-03-04 – 2023-03-05 (×3): 40 mg via ORAL
  Filled 2023-03-04 (×3): qty 2

## 2023-03-04 MED ORDER — POTASSIUM CHLORIDE CRYS ER 20 MEQ PO TBCR
40.0000 meq | EXTENDED_RELEASE_TABLET | Freq: Once | ORAL | Status: AC
  Administered 2023-03-04: 40 meq via ORAL
  Filled 2023-03-04: qty 2

## 2023-03-04 NOTE — TOC Progression Note (Signed)
Transition of Care [facility]) - Progression Note    Patient Details  Name: Ethan Wells MRN: [id number] Date of Birth: 1971-03-24  [facility] CM/SW Contact  Beverly Sessions, RN Phone Number: 03/04/2023, 2:39 PM  Clinical Narrative:     Therapy recommending home health.  Patient in agreement and states he does not have a preference of agency. Referral made to [facility] with [facility]        Expected Discharge Plan and Services                                               Social Determinants of Health (SDOH) Interventions SDOH Screenings   Food Insecurity: No Food Insecurity (02/22/2023)  Housing: Low Risk  (02/22/2023)  Transportation Needs: No Transportation Needs (02/22/2023)  Utilities: Not At Risk (02/22/2023)  Alcohol Screen: Medium Risk (10/01/2022)  Tobacco Use: High Risk (03/03/2023)    Readmission Risk Interventions    02/24/2023    3:12 PM 01/17/2023    3:38 PM 10/22/2022   10:47 AM  Readmission Risk Prevention Plan  Transportation Screening Complete Complete Complete  PCP or Specialist Appt within 3-5 Days   Complete  Social Work Consult for [place] Planning/Counseling   Complete  Palliative Care Screening   Not Applicable  Medication Review Press photographer) Complete Complete Complete  SW Recovery Care/Counseling Consult  Not Complete   SW Consult Not Complete Comments  lethargic   Palliative Care Screening Not Applicable Not [place] Not Applicable Not Applicable

## 2023-03-04 NOTE — Progress Notes (Signed)
[facility] Progress Note  [name]    [id number]  DOA: 02/22/2023     Date of Service: the patient was seen and examined on 03/04/2023  Chief Complaint  Patient presents with   GI Problem   Rectal Bleeding   Brief hospital course: Ethan Wells is a 52 y.o. male with medical history significant of polysubstance abuse (alcohol, tobacco, cocaine, marijuana), GI bleeding, hypertension, hyperlipidemia, CAD, STEMI with BMS placement, CHF, depression with anxiety, adrenal insufficiency, cardiac arrest due to V-fib in the setting of STEMI, who presents with GI bleeding.    Patient was recently hospitalized from 2/1 - 2/16 due to GI bleeding related hemorrhagic shock. Pt states that he started having coffee-ground hematemesis again since yesterday.  He states that it totally he has had more than 20 times of hematemesis.  He also has black tarry stool.  Patient has nausea and abdominal pain.  His abdominal pain is diffuse, constant, aching, mild to moderate, nonradiating.  No fever or chills.  He has mild shortness breath and chest discomfort, no cough.  Denies symptoms of UTI.  No confusion.  He reports possible syncope but he is uncertain about this.  No unilateral numbness or tinglings or weakness extremities.   Patient was found to be hypotensive initially, with blood pressure 74/56, which improved to 92/53 after giving 3 L LR in ED.  ED w/up: Hb 10.7 on 01/29/2023 --> 7.5, troponin 46, WBC 11.6, INR 1.6, PTT 33, temperature normal, heart rate 100-120s, RR 30 --> 14, oxygen saturation 98% on room air.   EKG: Sinus rhythm, QTc 471, LAE, early R wave progression, frequent PVC.   Patient is admitted to stepdown as inpatient.  Dr. Marius Ditch of GI is consulted.   Assessment and Plan: Principal Problem:   GI bleeding Active Problems:   Acute blood loss anemia   Adrenal insufficiency (HCC)   CAD (coronary artery disease)   Myocardial injury   Hypomagnesemia   Chronic  systolic CHF (congestive heart failure) (HCC)   HLD (hyperlipidemia)   Thrombocytopenia (HCC)   Essential hypertension   Depression with anxiety   Polysubstance abuse ([place])   Morbid obesity ([place])   # Acute GI bleeding # Acute blood loss anemia Hb 7.1--8.1--7.4--7.8--8.4--8.1--8.4--7.8 S/p 2 unit of PRBC transfusion Anemia workup within normal range S/p IV ceftriaxone x 7 days for SBP prophylaxis S/p IV fluid bolus, vital signs stable S/p pantoprazole IV infusion, switch to pantoprazole IV push and then pantoprazole 40 mg p.o. twice daily as patient lost IV on 3/14 S/p octreotide IV infusion DC'd on 3/14 Continue Zofran as needed Monitor H&H and transfuse if hemoglobin less than 7 GI consulted, EGD could not be done due to urine tox positive for cocaine. 3/12 UDS positive for cocaine and benzo, GI postponed EGD for now. 3/13 UDS positive for cocaine, GI recommended to repeat UDS on Friday 3/15 UDS negative for cocaine, plan for EGD tomorrow a.m. 3/16 s/p EGD, esophageal varices, banding done by GI.  Recommended not a candidate for beta-blocker due to cocaine abuse and patient will benefit from TIPS procedure due to recurrent variceal bleeding. Octreotide bolus was given and s/p infusion x 72 hours, no need of antibiotics, patient ready completed ceftriaxone for 7 days. Patient is not ready for TIPS procedure at this time, would like to be discharged home and repeat EGD after 2 months and then he will decide for TIPS procedure.   # Liver cirrhosis due to alcohol abuse Continue rifaximin 550  mg p.o. twice daily for hepatic encephalopathy, need to continue long-term on discharge Continue lactulose Ammonia 44--56--47--50 3/15 [place] abd Trace ascites, no safe window for paracentesis. paracentesis was not performed. 3/15 KUB No evidence of bowel obstruction.  S/p EGD as above, GI recommended TIPS procedure by IR.   Patient would like to be discharged, repeat EGD after 2 months and then he  will plan for TIPS procedure, patient is not ready at this time.  # EtOH abuse, withdrawal symptoms Continue CIWA protocol EtOH abstinence counseling done   # Hypokalemia, potassium repleted # Hypophosphatemia, Phos repleted. # Hypomagnesemia, mag repleted. Monitor electrolytes and replete as needed  # Adrenal insufficiency (HCC) -Give Solu-Cortef 100 mg x 2 doses as stress doses -Continue home Cortef 20 mg daily  -Continue home midodrine 10 mg 3 times daily for hypotension  CAD (coronary artery disease) and myocardial injury: Troponin 46.  History of STEMI with BMS placement. -Will not give aspirin due to GI bleeding.  Continue Crestor and Zetia Beta-blocker was previously held in the setting of chronic cocaine use -Troponin 172--101 trended down -A1c 5.6, 3/16 discussed with cardiology, as patient had chest pain after endoscopy and variceal banding.  Chronic systolic CHF (congestive heart failure) [facility]): Patient had history of systolic CHF.  2D echo on 07/13/2019 showed EF of 45-50%.  His EF has improved in recent years.  2D echo on 01/17/2023 showed EF of 50-55%. Held diuretics on admission due to hypotension. -BNP 207 3/16 developed lower extremity edema could be due to liver cirrhosis, hypoalbuminemia and anemia.  S/p Lasix IV infusion due to anasarca 3/19 started torsemide 40 mg p.o. BID, low-dose spironolactone as per cardiology.   HLD (hyperlipidemia) -Crestor, Zetia Lipid profile LDL 62, triglyceride 53, within normal range  Thrombocytopenia ([place]): This is a chronic issue.  Platelet 103 on 11/21/2022, 158 on 01/29/2023.  platelet 108--50.  Likely due to alcohol abuse and possible liver cirrhosis. -Follow-up with CBC   Essential hypertension -Hold off blood pressure medications and diuretics due to hypotension -Hold Imdur nitroglycerin    Depression with anxiety -Seroquel   Polysubstance abuse: Including cocaine, tobacco, alcohol, marijuana -Did counseling about  importance of quitting substance use -Nicotine patch -s/p CIWA protocol -UDS positive for cocaine    Obesity, Body mass index is 31.04 kg/m.  Interventions: -Healthy diet and exercise -Encouraged losing weight      Diet: Soft diet DVT Prophylaxis: SCD, pharmacological prophylaxis contraindicated due to GI bleeding    Advance goals of care discussion: Full code  Family Communication: family was NOT present at bedside, at the time of interview.  The pt provided permission to discuss medical plan with the family. Opportunity was given to ask question and all questions were answered satisfactorily.   Disposition:  Pt is from Home, admitted with GI bleeding, s/p IV PPI and octreotide, s/p EGD/banding, s/p IV octreotide for 72 hours, s/p Lasix IV infusion, transition to oral torsemide.  Patient is gradually improving.   Discharge plan to home most likely tomorrow a.m.    Subjective: No significant events overnight, patient is feeling improvement in the lower extremity edema, making enough urine.  Patient still has generalized body ache, no any other active issues.   Physical Exam: General: NAD, lying comfortably in the bed   Appear in no distress, affect appropriate Eyes: PERRLA ENT: Oral Mucosa Clear, moist  Neck: no JVD,  Cardiovascular: S1 and S2 Present, no Murmur,  Respiratory: good respiratory effort, Bilateral Air entry equal and Decreased, no  Crackles, no wheezes Abdomen: Bowel Sound present, Soft, distended and mild tenderness,  Skin: no rashes Extremities: 2-3+ Pedal edema, no calf tenderness.  Edema gradually improving Neurologic: without any new focal findings Gait not checked due to patient safety concerns  Vitals:   03/03/23 1538 03/03/23 1954 03/04/23 0226 03/04/23 0814  BP: 108/60 (!) 100/55 (!) 101/55 101/62  Pulse: 70 (!) 55 81 77  Resp: 18 18 18 18   Temp: 98.4 F (36.9 C) 97.7 F (36.5 C) 98.4 F (36.9 C) 97.7 F (36.5 C)  TempSrc: Oral Oral Oral Oral   SpO2: 96% 97% 98% 97%  Weight:      Height:        Intake/Output Summary (Last 24 hours) at 03/04/2023 1352 Last data filed at 03/04/2023 1100 Gross per 24 hour  Intake 1198.46 ml  Output 2300 ml  Net -1101.54 ml   Filed Weights   02/22/23 0905  Weight: 84.6 kg    Data Reviewed: I have personally reviewed and interpreted daily labs, tele strips, imagings as discussed above. I reviewed all nursing notes, pharmacy notes, vitals, pertinent old records I have discussed plan of care as described above with RN and patient/family.  CBC: Recent Labs  Lab 02/28/23 0445 03/01/23 0340 03/02/23 0330 03/03/23 0340 03/04/23 0640  WBC 5.9 5.3 5.8 6.4 5.4  HGB 8.1* 7.9* 8.4* 7.8* 8.4*  HCT 25.9* 25.3* 26.8* 24.9* 26.6*  MCV 95.9 95.5 94.7 94.3 95.0  PLT 77* 82* 98* 104* [id number]*   Basic Metabolic Panel: Recent Labs  Lab 02/26/23 0519 02/27/23 0558 02/28/23 0445 03/01/23 0340 03/02/23 0330 03/03/23 0340 03/04/23 0640  NA 139 136 140 139 137 139 141  K 3.8 4.0 4.1 3.9 3.3* 3.3* 3.4*  CL 114* 112* 110 110 105 101 104  CO2 21* 20* 20* 24 24 30 29   GLUCOSE 139* 112* 108* 111* 168* 137* 133*  BUN <5* 5* 7 6 6 12 12   CREATININE 0.65 0.58* 0.57* 0.56* 0.62 0.63 0.62  CALCIUM 7.9* 8.0* 8.5* 8.3* 8.4* 8.3* 8.4*  MG 1.9 1.8 1.8 1.7 1.7  --   --   PHOS 2.7 2.6 2.7 3.1 3.1  --   --     Studies: No results found.  Scheduled Meds:  ezetimibe  10 mg Oral Daily   ferrous sulfate  325 mg Oral Q breakfast   folic acid  1 mg Oral Daily   gabapentin  300 mg Oral TID   hydrocortisone  20 mg Oral Daily   lactulose  20 g Oral BID   midodrine  10 mg Oral TID WC   multivitamin with minerals  1 tablet Oral Daily   nicotine  21 mg Transdermal Daily   pantoprazole  40 mg Oral BID   QUEtiapine  25 mg Oral QHS   rifaximin  550 mg Oral BID   rosuvastatin  40 mg Oral Daily   sodium chloride flush  10-40 mL Intracatheter Q12H   spironolactone  12.5 mg Oral Daily   thiamine  100 mg Oral Daily    Or   thiamine  100 mg Intravenous Daily   torsemide  40 mg Oral BID   Continuous Infusions:  sodium chloride Stopped (02/23/23 1806)   lactated ringers     PRN Meds: acetaminophen **OR** acetaminophen, lidocaine, methocarbamol, metoCLOPramide (REGLAN) injection, nitroGLYCERIN, mouth rinse, oxyCODONE, sodium chloride flush, traZODone  Time spent: 35 minutes  Author: Val Riles. MD [facility] 03/04/2023 1:52 PM  To reach On-call, see care teams  to locate the attending and reach out to them via [email or web address]. If 7PM-7AM, please contact night-coverage If you still have difficulty reaching the attending provider, please page the [facility] (Director on Call) for [facility] on amion for assistance.

## 2023-03-04 NOTE — Progress Notes (Signed)
Rounding Note    Patient Name: Ethan Wells Date of Encounter: 03/04/2023  [place] Cardiologist: Lauree Chandler, MD   Subjective   Breathing stable, largely unchanged throughout the admission.  No chest pain.  Lower extremity swelling improved, though does persist.  Continues to report diffuse pain.  Documented urine output 1.1 L for the past 24 hours, now net -430 mL for the admission.  Renal function stable.  Inpatient Medications    Scheduled Meds:  ezetimibe  10 mg Oral Daily   ferrous sulfate  325 mg Oral Q breakfast   folic acid  1 mg Oral Daily   gabapentin  300 mg Oral TID   hydrocortisone  20 mg Oral Daily   lactulose  20 g Oral BID   midodrine  10 mg Oral TID WC   multivitamin with minerals  1 tablet Oral Daily   nicotine  21 mg Transdermal Daily   octreotide  50 mcg Intravenous Once   pantoprazole  40 mg Oral BID   QUEtiapine  25 mg Oral QHS   rifaximin  550 mg Oral BID   rosuvastatin  40 mg Oral Daily   sodium chloride flush  10-40 mL Intracatheter Q12H   spironolactone  12.5 mg Oral Daily   thiamine  100 mg Oral Daily   Or   thiamine  100 mg Intravenous Daily   torsemide  40 mg Oral BID   Continuous Infusions:  sodium chloride Stopped (02/23/23 1806)   lactated ringers     octreotide (SANDOSTATIN) 500 mcg in sodium chloride 0.9 % 250 mL (2 mcg/mL) infusion 50 mcg/hr (03/04/23 0856)   PRN Meds: acetaminophen **OR** acetaminophen, lidocaine, methocarbamol, metoCLOPramide (REGLAN) injection, nitroGLYCERIN, mouth rinse, oxyCODONE, sodium chloride flush, traZODone   Vital Signs    Vitals:   03/03/23 1538 03/03/23 1954 03/04/23 0226 03/04/23 0814  BP: 108/60 (!) 100/55 (!) 101/55 101/62  Pulse: 70 (!) 55 81 77  Resp: 18 18 18 18   Temp: 98.4 F (36.9 C) 97.7 F (36.5 C) 98.4 F (36.9 C) 97.7 F (36.5 C)  TempSrc: Oral Oral Oral Oral  SpO2: 96% 97% 98% 97%  Weight:      Height:        Intake/Output Summary (Last 24  hours) at 03/04/2023 1051 Last data filed at 03/04/2023 0900 Gross per 24 hour  Intake 838.46 ml  Output 3500 ml  Net -2661.54 ml       02/22/2023    9:05 AM 01/20/2023    1:30 AM 01/17/2023    4:30 AM  Last 3 Weights  Weight (lbs) 186 lb 8.2 oz 229 lb 11.5 oz 224 lb 3.3 oz  Weight (kg) 84.6 kg 104.2 kg 101.7 kg      Telemetry    SR HR 80s rare PVCs and a brief atrial run - Personally Reviewed  ECG    No new tracings- Personally Reviewed  Physical Exam   GEN: No acute distress.   Neck: No JVD Cardiac: RRR, no murmurs, rubs, or gallops.  Respiratory: Mildly diminished along the bases bilaterally. GI: Soft, nontender, non-distended  MS: Trace bilateral lower extremity edema; No deformity. Neuro:  Nonfocal  Psych: Normal affect   Labs    High Sensitivity Troponin:   Recent Labs  Lab 02/23/23 1713 02/23/23 1924 02/23/23 2142 02/24/23 0132 03/01/23 0914  TROPONINIHS 98* 84* 120* 143* 23*      Chemistry Recent Labs  Lab 02/28/23 0445 03/01/23 0340 03/02/23 0330 03/03/23 0340 03/04/23  0640  NA 140 139 137 139 141  K 4.1 3.9 3.3* 3.3* 3.4*  CL 110 110 105 101 104  CO2 20* 24 24 30 29   GLUCOSE 108* 111* 168* 137* 133*  BUN 7 6 6 12 12   CREATININE 0.57* 0.56* 0.62 0.63 0.62  CALCIUM 8.5* 8.3* 8.4* 8.3* 8.4*  MG 1.8 1.7 1.7  --   --   PROT 5.7* 5.5* 6.7 6.1* 6.2*  ALBUMIN 2.5* 2.5* 2.9* 2.5* 2.7*  AST 44* 61* 61* 44* 63*  ALT 21 26 30 24  36  ALKPHOS 129* 134* 139* 117 114  BILITOT 1.3* 1.1 1.3* 1.2 1.0  GFRNONAA >60 >60 >60 >60 >60  ANIONGAP 10 5 8 8 8      Lipids No results for input(s): "CHOL", "TRIG", "HDL", "LABVLDL", "LDLCALC", "CHOLHDL" in the last 168 hours.  Hematology Recent Labs  Lab 03/02/23 0330 03/03/23 0340 03/04/23 0640  WBC 5.8 6.4 5.4  RBC 2.83* 2.64* 2.80*  HGB 8.4* 7.8* 8.4*  HCT 26.8* 24.9* 26.6*  MCV 94.7 94.3 95.0  MCH 29.7 29.5 30.0  MCHC 31.3 31.3 31.6  RDW 21.2* 21.1* 20.4*  PLT 98* 104* 100*    Thyroid No results for  input(s): "TSH", "FREET4" in the last 168 hours.  BNPNo results for input(s): "BNP", "PROBNP" in the last 168 hours.  DDimer No results for input(s): "DDIMER" in the last 168 hours.   Radiology    No results found.  Cardiac Studies   Echo 01/2023  1. Left ventricular ejection fraction, by estimation, is 50 to 55%. The  left ventricle has low normal function. The left ventricle has no regional  wall motion abnormalities. Left ventricular diastolic parameters are  indeterminate.   2. Right ventricular systolic function is normal. The right ventricular  size is normal.   3. The mitral valve is normal in structure. Mild mitral valve  regurgitation. No evidence of mitral stenosis.   4. The aortic valve is normal in structure. Aortic valve regurgitation is  not visualized. No aortic stenosis is present.   5. The inferior vena cava is normal in size with greater than 50%  respiratory variability, suggesting right atrial pressure of 3 mmHg.    Cardiac Cath 10/2022    Mid Cx lesion is 100% stenosed.   Ramus lesion is 85% stenosed.   Prox LAD to Mid LAD lesion is 5% stenosed.   Prox RCA to Mid RCA lesion is 100% stenosed.   Prox RCA lesion is 80% stenosed.   There is mild left ventricular systolic dysfunction.   LV end diastolic pressure is moderately elevated.   The left ventricular ejection fraction is 45-50% by visual estimate.   1.  Severe two-vessel coronary artery disease with chronically occluded mid left circumflex and mid right coronary artery with collaterals.  These vessels were patent with mild disease on previous cardiac catheterization in 2012.  LAD stent is patent with minimal restenosis.  In addition, there is progression of proximal to mid ramus stenosis. 2.  Mildly reduced LV systolic function with an EF of 45%. 3.  Moderately elevated left ventricular end-diastolic pressure at 26 mmHg.   Recommendations: The patient has 2 chronic occlusions with collaterals.  Recommend  medical therapy for now.  I added small dose Imdur.  Given ongoing rectal bleeding, risks of PCI outweigh the benefit at this time.  In the near future, once his bleeding issues are controlled and if he continues to have anginal symptoms, PCI of the ramus and  possibly the right coronary artery can be considered.  The RCA has a microchannel of patent flow. Continue IV furosemide today given elevated LVEDP and switch to oral likely tomorrow.    Patient Profile     52 y.o. male with a hx of CAD s/p STEMI and VF arrest in 01/2011 with LAD stenting, NSTEMI 10/2022, HTN, HL, ICM w/ improved Ef, polysubstance abuse, hemorrhoidal bleeding w/ anemia and mesenteric dz s/p SMA stenting who is being seen 03/01/2023 for the evaluation of chest pain   Assessment & Plan    Chest pain CAD -History of LAD stenting in the setting of STEMI/VF arrest in 2012.  Recent non-STEMI November 2023 with findings of patent LAD stent and occluded left circumflex and RCA with collaterals from LAD filling both distal territories.  Medical management advised. - Patient reported atypical chest pain in the setting of EGD with banding - High-sensitivity troponin 23, not consistent with ACS - No indication for heparin drip - EKG nonischemic - Echo showed 01/2023 showed LVEF 50-55%, no WMA, mild MR.  - PTA Imdur 30mg  held with midodrine use - Zetia, Crestor 40mg  daily - No plan for further ischemic evaluation. Restart PTA meds as able   Chronic HFpEF -Overall, does not appear largely volume Most recent echo 01/2023 showed LVEF 50-55% - Likely exacerbated by third spacing with albumin 2.7 and anemia with hemoglobin 8.4 -Transition from Lasix drip to torsemide 3 mg twice daily -Add low-dose spironolactone  Liver cirrhosis -Ongoing management per IM and GI -Add spironolactone as outlined above -Third spacing with hypoalbuminemia likely contributing to lower extremity edema   For questions or updates, please contact [place] Please consult [email or web address] for contact info under        Signed, Christell Faith, PA-C  03/04/2023, 10:51 AM

## 2023-03-04 NOTE — Evaluation (Signed)
Physical Therapy Evaluation Patient Details Name: Ethan Wells MRN: [id number] DOB: [birth year] Today's Date: 03/04/2023  History of Present Illness  presented to ER secondary to coffee-ground hematemesis; admitted for management of GIB with ABLA.  Of note, recent hospitalization (2/1-2/16 due to hemorrhagic shock)  Clinical Impression  Patient resting in bed upon arrival to room; alert and oriented, follows commands and agreeable to participation with session (with min/mod encouragement from therapist).  Endorses generalized soreness, but no specific/focal pain. Bilat UE/LE strength and ROM grossly symmetrical and WFL; no focal weakness appreciated.  Bilat LEs generally edematous (bilat knees distally); reports elevating as able.  Currently able to complete bed mobility with mod indep; sit/stand, basic transfers and gait (30') with IV pole (refused RW), sup/mod indep.  Demonstrates reciprocal stepping pattern with fair step height/length; negotiates turns/obstacles without difficulty; good functional reach and overall awareness of limits of stability. Declined additional distance due to generalized fatigue.   Would benefit from skilled PT to address above deficits and promote optimal return to PLOF.; Recommend transition to HHPT upon discharge from acute hospitalization.  If able to demonstrate increased ambulation distance/tolerance and ability to safely negotiate stairs, may progress to home with no PT follow up.     Recommendations for follow up therapy are one component of a multi-disciplinary discharge planning process, led by the attending physician.  Recommendations may be updated based on patient status, additional functional criteria and insurance authorization.  Follow Up Recommendations Home health PT      Assistance Recommended at Discharge    Patient can return home with the following  A little help with walking and/or transfers;A little help with bathing/dressing/bathroom     Equipment Recommendations    Recommendations for Other Services       Functional Status Assessment       Precautions / Restrictions Precautions Precautions: None Restrictions Weight Bearing Restrictions: No      Mobility  Bed Mobility Overal bed mobility: Needs Assistance Bed Mobility: Supine to Sit     Supine to sit: Supervision          Transfers Overall transfer level: Needs assistance Equipment used: Rolling walker (2 wheels), None Transfers: Sit to/from Stand Sit to Stand: Supervision, Modified independent (Device/Increase time)           General transfer comment: with and without assist device, close sup/mod indep    Ambulation/Gait Ambulation/Gait assistance: Supervision Gait Distance (Feet): 30 Feet Assistive device: IV Pole         General Gait Details: reciprocal stepping pattern with fair step height/length; negotiates turns/obstacles without difficulty; good functional reach and overall awareness of limits of stability.  Declined additional distance due to generalized fatigue  Stairs            Wheelchair Mobility    Modified Rankin (Stroke Patients Only)       Balance Overall balance assessment: Needs assistance Sitting-balance support: No upper extremity supported, Feet supported Sitting balance-Leahy Scale: Good     Standing balance support: Single extremity supported Standing balance-Leahy Scale: Fair                               Pertinent Vitals/Pain Pain Assessment Pain Assessment: No/denies pain    Home Living Family/patient expects to be discharged to:: Private residence Living Arrangements:  (boarding home)   Type of Home:  (boarding home) Home Access: Stairs to enter Entrance Stairs-Rails: Chemical engineer of Steps: 5-6  Home Layout: Able to live on main level with bedroom/bathroom Home Equipment: Cane - single Barista (2 wheels)      Prior Function Prior  Level of Function : Independent/Modified Independent             Mobility Comments: Uses SPC/RW for mobility       Hand Dominance   Dominant Hand: Right    Extremity/Trunk Assessment   Upper Extremity Assessment Upper Extremity Assessment: Overall WFL for tasks assessed (grossly 4/5 throughout; no focal weakness appreciated)    Lower Extremity Assessment Lower Extremity Assessment: Overall WFL for tasks assessed (grossly 4/5 throughout, generally edematous throughout; no focal weakness appreciated)       Communication   Communication: No difficulties  Cognition Arousal/Alertness: Awake/alert Behavior During Therapy: WFL for tasks assessed/performed Overall Cognitive Status: Within Functional Limits for tasks assessed                                 General Comments: requires encouragement for full, active participation with session (and overall care)        General Comments      Exercises     Assessment/Plan    PT Assessment Patient needs continued PT services  PT Problem List Decreased activity tolerance;Decreased balance;Decreased mobility;Decreased knowledge of use of DME;Decreased safety awareness;Decreased knowledge of precautions       PT Treatment Interventions DME instruction;Gait training;Stair training;Functional mobility training;Therapeutic exercise;Therapeutic activities;Balance training;Patient/family education    PT Goals (Current goals can be found in the Care Plan section)  Acute Rehab PT Goals Patient Stated Goal: to get stronger PT Goal Formulation: With patient Time For Goal Achievement: 03/18/23 Potential to Achieve Goals: Good    Frequency Min 2X/week     Co-evaluation               AM-PAC PT "6 Clicks" Mobility  Outcome Measure Help needed turning from your back to your side while in a flat bed without using bedrails?: None Help needed moving from lying on your back to sitting on the side of a flat bed  without using bedrails?: None Help needed moving to and from a bed to a chair (including a wheelchair)?: A Little Help needed standing up from a chair using your arms (e.g., wheelchair or bedside chair)?: A Little Help needed to walk in hospital room?: A Little Help needed climbing 3-5 steps with a railing? : A Little 6 Click Score: 20    End of Session   Activity Tolerance: Patient tolerated treatment well Patient left: in bed;with call bell/phone within reach Nurse Communication: Mobility status PT Visit Diagnosis: Muscle weakness (generalized) (M62.81);Difficulty in walking, not elsewhere classified (R26.2)    Time: [id number] PT Time Calculation (min) (ACUTE ONLY): 24 min   Charges:   PT Evaluation $PT Eval Moderate Complexity: 1 Mod          Brodee Mauritz H. Owens Shark, PT, DPT, NCS 03/04/23, 10:00 AM [phone]

## 2023-03-04 NOTE — Consult Note (Signed)
Consultation Note Date: 03/04/2023   Patient Name: Ethan Wells  DOB: March 13, 1971  MRN: [id number]  Age / Sex: 52 y.o., male  PCP: Nickola Major, NP Referring Physician: Val Riles, MD  Reason for Consultation: Establishing goals of care  HPI/Patient Profile: Ethan Wells is a 52 y.o. male with medical history significant of polysubstance abuse (alcohol, tobacco, cocaine, marijuana), GI bleeding, hypertension, hyperlipidemia, CAD, STEMI with BMS placement, CHF, depression with anxiety, adrenal insufficiency, cardiac arrest due to V-fib in the setting of STEMI, who presents with GI bleeding.    Patient was recently hospitalized from 2/1 - 2/16 due to GI bleeding related hemorrhagic shock. Pt states that he started having coffee-ground hematemesis again since yesterday.  He states that it totally he has had more than 20 times of hematemesis.  He also has black tarry stool.  Patient has nausea and abdominal pain.  His abdominal pain is diffuse, constant, aching, mild to moderate, nonradiating.  No fever or chills.  He has mild shortness breath and chest discomfort, no cough.  Denies symptoms of UTI.  No confusion.  He reports possible syncope but he is uncertain about this.  No unilateral numbness or tinglings or weakness extremities.   Patient was found to be hypotensive initially, with blood pressure 74/56, which improved to 92/53 after giving 3 L LR in ED.  Clinical Assessment and Goals of Care: Notes and labs reviewed.  Into see patient.  He is sitting on side of bed waiting to be moved to a different room.  Patient tells me that he is single.  He states he has children of which he has no contact with and will not discuss this further.  He states his cousin Holley Raring who is his emergency contact would be his surrogate decision maker.  Patient states he lives in a boardinghouse with 10 other  occupants. He states usually during the day he spends his time watching TV as he tries to stay away from who he calls "the riff raff" that lives in the building.  We discussed his diagnosis, prognosis, GOC, EOL wishes disposition and options.  Created space and opportunity for patient  to explore thoughts and feelings regarding current medical information.   A detailed discussion was had today regarding advanced directives.  Concepts specific to code status, artifical feeding and hydration, IV antibiotics and rehospitalization were discussed.  The difference between an aggressive medical intervention path and a comfort care path was discussed.  Values and goals of care important to patient and family were attempted to be elicited.  Discussed limitations of medical interventions to prolong quality of life in some situations and discussed the concept of human mortality.  He states he would never want to live long-term in a vegetative state, but would be amenable to any measures short-term.  He discusses living through CPR previously, and states he would want this again if needed.  Upon discussing the TIPS procedure, he states he would like a second opinion before considering this.   Upon discussing  lifestyle modification, he states he has checked himself into rehab several times, but there was no follow-up upon leaving rehab and he relapsed.  He states he is willing to make what ever changes needed to try to live longer.    SUMMARY OF RECOMMENDATIONS   Full code/full scope at this time.  Prognosis:  Unable to determine      Primary Diagnoses: Present on Admission:  GI bleeding  Acute blood loss anemia  Essential hypertension  CAD (coronary artery disease)  Morbid obesity (HCC)  Polysubstance abuse (HCC)  Chronic systolic CHF (congestive heart failure) (HCC)  Myocardial injury  Hypomagnesemia  HLD (hyperlipidemia)  Depression with anxiety  Thrombocytopenia ([place])  Adrenal  insufficiency ([place])   I have reviewed the medical record, interviewed the patient and family, and examined the patient. The following aspects are pertinent.  Past Medical History:  Diagnosis Date   CAD (coronary artery disease)    a. 01/2011 Anterior STEMI/Cath/PCI: LM nl, LAD 100d (3.5x73mm Vision BMS placed), LCX 103m, RI 50, RCA min irregs, EF 40% w/ apical, inferoapical HK; b. 10/2022 NSTSEMI/Cath: LM nl, LAD 5p/m, RI 85, LCX 162m, OM1 mild dzs, OM3 100 fills via L->L collats from dLAD, RCA 80p, 177m, RPDA fills via collats from LAD. EF 45-50%-->Med Rx.   Cardiac arrest - ventricular fibrillation    a. In setting of STEMI 01/2011   Chronic pain    Cocaine abuse ([place])    Depression with anxiety    ETOH abuse    a. 6-12 beers/day   GERD (gastroesophageal reflux disease)    GI bleed    Hemorrhoids    Hyperlipidemia    Hypertension    Ischemic cardiomyopathy    a. 06/2011 Echo: EF 45-50%, No rwma; b. 10/2022 Echo: EF 55-60%, no rwma, nl RV fxn.   Marijuana abuse    Migraines    Tobacco abuse    a. 1/2 ppd x 26 yrs   Social History   Socioeconomic History   Marital status: Single    Spouse name: Not on file   Number of children: Not on file   Years of education: Not on file   Highest education level: Not on file  Occupational History   Not on file  Tobacco Use   Smoking status: Every Day    Packs/day: 0.50    Years: 41.00    Additional pack years: 0.00    Total pack years: 20.50    Types: Cigarettes   Smokeless tobacco: Never   Tobacco comments:    started age 33  Vaping Use   Vaping Use: Never used  Substance and Sexual Activity   Alcohol use: Yes    Alcohol/week: 12.0 standard drinks of alcohol    Types: 12 Cans of beer per week    Comment: at least a 6 pack of beer daily and often a 12 pack   Drug use: Not Currently    Types: Cocaine, Marijuana   Sexual activity: Not on file  Other Topics Concern   Not on file  Social History Narrative   Single, lives in  [place] with his mother.  He does not routinely exercise.  He sometimes works, helping to deliver appliances to homes.   Social Determinants of Health   Financial Resource Strain: Not on file  Food Insecurity: No Food Insecurity (02/22/2023)   Hunger Vital Sign    Worried About Running Out of Food in the Last Year: Never true    Ran Out of Food in  the Last Year: Never true  Transportation Needs: No Transportation Needs (02/22/2023)   PRAPARE - Hydrologist (Medical): No    Lack of Transportation (Non-Medical): No  Physical Activity: Not on file  Stress: Not on file  Social Connections: Not on file   Family History  Problem Relation Age of Onset   Coronary artery disease Mother        alive   Other Other        2 sisters alive and well   Alcohol abuse Father        died @ 55   Other Paternal Grandmother        PACER   Scheduled Meds:  ezetimibe  10 mg Oral Daily   ferrous sulfate  325 mg Oral Q breakfast   folic acid  1 mg Oral Daily   gabapentin  300 mg Oral TID   hydrocortisone  20 mg Oral Daily   lactulose  20 g Oral BID   midodrine  10 mg Oral TID WC   multivitamin with minerals  1 tablet Oral Daily   nicotine  21 mg Transdermal Daily   pantoprazole  40 mg Oral BID   QUEtiapine  25 mg Oral QHS   rifaximin  550 mg Oral BID   rosuvastatin  40 mg Oral Daily   sodium chloride flush  10-40 mL Intracatheter Q12H   spironolactone  12.5 mg Oral Daily   thiamine  100 mg Oral Daily   Or   thiamine  100 mg Intravenous Daily   torsemide  40 mg Oral BID   Continuous Infusions:  sodium chloride Stopped (02/23/23 1806)   lactated ringers     PRN Meds:.acetaminophen **OR** acetaminophen, lidocaine, methocarbamol, metoCLOPramide (REGLAN) injection, nitroGLYCERIN, mouth rinse, oxyCODONE, sodium chloride flush, traZODone Medications Prior to Admission:  Prior to Admission medications   Medication Sig Start Date End Date Taking? Authorizing Provider   folic acid (FOLVITE) 1 MG tablet Take 1 tablet (1 mg total) by mouth daily. 02/01/23  Yes Fritzi Mandes, MD  gabapentin (NEURONTIN) 300 MG capsule Take 1 capsule (300 mg total) by mouth 3 (three) times daily. 10/17/22  Yes Medina-Vargas, Monina C, NP  hydrocortisone (CORTEF) 10 MG tablet Take 1 tablet (10 mg total) by mouth daily. 01/31/23  Yes Fritzi Mandes, MD  isosorbide mononitrate (IMDUR) 30 MG 24 hr tablet Take 15 mg by mouth daily. 02/22/23  Yes [provider]  methocarbamol (ROBAXIN) 750 MG tablet TAKE 1 TABLET (750 MG TOTAL) BY MOUTH DAILY AS NEEDED FOR MUSCLE SPASMS 02/06/23  Yes Medina-Vargas, Monina C, NP  midodrine (PROAMATINE) 10 MG tablet Take 1 tablet (10 mg total) by mouth 3 (three) times daily with meals. 01/31/23  Yes Fritzi Mandes, MD  pantoprazole (PROTONIX) 40 MG tablet TAKE 1 TABLET BY MOUTH EVERY DAY 12/23/22  Yes Medina-Vargas, Monina C, NP  QUEtiapine (SEROQUEL) 25 MG tablet Take 1 tablet (25 mg total) by mouth at bedtime. 01/31/23  Yes Fritzi Mandes, MD  rosuvastatin (CRESTOR) 40 MG tablet TAKE 1 TABLET BY MOUTH EVERY DAY 12/19/22  Yes Medina-Vargas, Monina C, NP  torsemide (DEMADEX) 20 MG tablet Take 1 tablet (20 mg total) by mouth daily. 02/01/23  Yes Fritzi Mandes, MD  acetaminophen (TYLENOL) 500 MG tablet Take 2 tablets (1,000 mg total) by mouth every 6 (six) hours as needed for mild pain. Patient not taking: Reported on 01/17/2023 10/28/22   Olean Ree, MD  ezetimibe (ZETIA) 10 MG tablet Take 10  mg by mouth daily. Patient not taking: Reported on 02/28/2023    [provider]  ferrous sulfate 325 (65 FE) MG tablet Take 1 tablet (325 mg total) by mouth daily with breakfast. Patient not taking: Reported on 02/28/2023 01/31/23 05/01/23  Fritzi Mandes, MD  furosemide (LASIX) 40 MG tablet Take 40 mg by mouth daily. Patient not taking: Reported on 02/28/2023 02/01/23   [provider]  hydrocortisone (CORTEF) 20 MG tablet Take 1 tablet (20 mg total) by mouth daily. Patient  not taking: Reported on 02/28/2023 02/01/23   Fritzi Mandes, MD  nitroGLYCERIN (NITROSTAT) 0.4 MG SL tablet Place 1 tablet (0.4 mg total) under the tongue every 5 (five) minutes as needed for chest pain. 11/06/22   Enzo Bi, MD  oxyCODONE 10 MG TABS Take 1 tablet (10 mg total) by mouth every 8 (eight) hours as needed for severe pain. 01/31/23   Fritzi Mandes, MD  thiamine (VITAMIN B-1) 100 MG tablet Take 1 tablet (100 mg total) by mouth daily. Patient not taking: Reported on 02/28/2023 02/01/23   Fritzi Mandes, MD   Allergies  Allergen Reactions   Penicillins Hives    Tolerated Ceftriaxone 10/2022   Other Rash    chives   Review of Systems  All other systems reviewed and are negative.   Physical Exam Pulmonary:     Effort: Pulmonary effort is normal.  Neurological:     Mental Status: He is alert.     Vital Signs: BP 101/62 (BP Location: Right Arm)   Pulse 77   Temp 97.7 F (36.5 C) (Oral)   Resp 18   Ht 5\' 5"  (1.651 m)   Wt 84.6 kg   SpO2 97%   BMI 31.04 kg/m  Pain Scale: 0-10 POSS *See Group Information*: 1-Acceptable,Awake and alert Pain Score: Asleep   SpO2: SpO2: 97 % O2 Device:SpO2: 97 % O2 Flow Rate: .O2 Flow Rate (L/min): 6 L/min  IO: Intake/output summary:  Intake/Output Summary (Last 24 hours) at 03/04/2023 1216 Last data filed at 03/04/2023 1100 Gross per 24 hour  Intake 1198.46 ml  Output 2700 ml  Net -1501.54 ml    LBM: Last BM Date : 03/03/23 Baseline Weight: Weight: 84.6 kg Most recent weight: Weight: 84.6 kg      Signed by: Asencion Gowda, NP   Please contact Palliative Medicine Team phone at [phone] for questions and concerns.  For individual provider: See Shea Evans

## 2023-03-04 NOTE — Evaluation (Signed)
Occupational Therapy Evaluation Patient Details Name: Ethan Wells MRN: [id number] DOB: Nov 02, 1971 Today's Date: 03/04/2023   History of Present Illness presented to ER secondary to coffee-ground hematemesis; admitted for management of GIB with ABLA.  Of note, recent hospitalization (2/1-2/16 due to hemorrhagic shock)   Clinical Impression   Patient seen for OT evaluation, agreeable with encouragement. Pt presenting with decreased independence in self care, balance, functional mobility/transfers, endurance, and safety awareness. PTA pt was Mod I for ADLs/IADLs and functional mobility using a SPC or RW. Pt found standing at bedside attempting to use urinal. Min guard provided for safety with dynamic standing balance and clothing management during urinal use. He required supervision for simulated toilet transfer and supervision to take steps at EOB without an AD (deferred use of RW). Pt will benefit from acute OT to increase overall independence in the areas of ADLs and functional mobility in order to safely discharge to next venue of care. OT recommends ongoing therapy upon discharge to maximize safety and independence with ADLs, decrease fall risk, decrease caregiver burden, and promote return to PLOF.     Recommendations for follow up therapy are one component of a multi-disciplinary discharge planning process, led by the attending physician.  Recommendations may be updated based on patient status, additional functional criteria and insurance authorization.   Follow Up Recommendations  Home health OT     Assistance Recommended at Discharge Intermittent Supervision/Assistance  Patient can return home with the following A little help with walking and/or transfers;A little help with bathing/dressing/bathroom    Functional Status Assessment  Patient has had a recent decline in their functional status and demonstrates the ability to make significant improvements in function in a reasonable  and predictable amount of time.  Equipment Recommendations  None recommended by OT    Recommendations for Other Services       Precautions / Restrictions Precautions Precautions: None Restrictions Weight Bearing Restrictions: No      Mobility Bed Mobility               General bed mobility comments: NT, left sitting EOB    Transfers Overall transfer level: Needs assistance Equipment used: None Transfers: Sit to/from Stand Sit to Stand: Supervision                  Balance Overall balance assessment: Needs assistance Sitting-balance support: No upper extremity supported, Feet supported Sitting balance-Leahy Scale: Good     Standing balance support: Single extremity supported Standing balance-Leahy Scale: Fair         ADL either performed or assessed with clinical judgement   ADL Overall ADL's : Needs assistance/impaired         Lower Body Dressing: Set up;Supervision/safety;Sitting/lateral leans Lower Body Dressing Details (indicate cue type and reason): socks Toilet Transfer: Copy Details (indicate cue type and reason): simulated Toileting- Clothing Manipulation and Hygiene: Min guard Toileting - Clothing Manipulation Details (indicate cue type and reason): Min guard for safety     Functional mobility during ADLs: Supervision/safety (to take several steps at EOB, no AD - pt deferred use of RW) General ADL Comments: Pt requesting to use urinal while standing at EOB vs going into bathroom despite encouragement. Evaluation cut short 2/2 NP entering room.     Vision Patient Visual Report: No change from baseline       Perception     Praxis      Pertinent Vitals/Pain Pain Assessment Pain Assessment: No/denies pain  Hand Dominance Right   Extremity/Trunk Assessment Upper Extremity Assessment Upper Extremity Assessment: Overall WFL for tasks assessed   Lower Extremity Assessment Lower Extremity  Assessment: Overall WFL for tasks assessed       Communication Communication Communication: No difficulties   Cognition Arousal/Alertness: Awake/alert Behavior During Therapy: WFL for tasks assessed/performed Overall Cognitive Status: Within Functional Limits for tasks assessed       General Comments: A&Ox4, requires encouragement to participate, VC for safety awareness - found pt standing at bedside attempting to use urinal     General Comments       Exercises Other Exercises Other Exercises: OT provided education re: role of OT, OT POC, post acute recs, sitting up for all meals, EOB/OOB mobility with assistance, home/fall safety.     Shoulder Instructions      Home Living Family/patient expects to be discharged to:: Private residence Living Arrangements: Other (Comment) (boarding house)     Home Access: Stairs to enter Technical brewer of Steps: 5-6 Entrance Stairs-Rails: Left;Right Home Layout: Able to live on main level with bedroom/bathroom     Bathroom Shower/Tub: Occupational psychologist: [place]: [place] - single Barista (2 wheels)          Prior Functioning/Environment Prior Level of Function : Independent/Modified Independent;Driving             Mobility Comments: Uses SPC/RW for mobility ADLs Comments: Pt reports doing "the best I can" with ADLs/IADLs. Reports needing assist for IADLs but does not have help available.        OT Problem List: Decreased activity tolerance;Impaired balance (sitting and/or standing);Decreased safety awareness;Decreased knowledge of use of DME or AE;Decreased knowledge of precautions      OT Treatment/Interventions: Self-care/ADL training;Therapeutic exercise;Neuromuscular education;Energy conservation;DME and/or AE instruction;Manual therapy;Modalities;Balance training;Patient/family education;Visual/perceptual remediation/compensation;Cognitive  remediation/compensation;Therapeutic activities;Splinting    OT Goals(Current goals can be found in the care plan section) Acute Rehab OT Goals Patient Stated Goal: get stronger OT Goal Formulation: With patient Time For Goal Achievement: 03/18/23 Potential to Achieve Goals: Good   OT Frequency: Min 2X/week    Co-evaluation              AM-PAC OT "6 Clicks" Daily Activity     Outcome Measure Help from another person eating meals?: None Help from another person taking care of personal grooming?: None Help from another person toileting, which includes using toliet, bedpan, or urinal?: A Little Help from another person bathing (including washing, rinsing, drying)?: A Little Help from another person to put on and taking off regular upper body clothing?: None Help from another person to put on and taking off regular lower body clothing?: A Little 6 Click Score: 21   End of Session Nurse Communication: Mobility status  Activity Tolerance: Patient tolerated treatment well Patient left: in bed;with call bell/phone within reach;Other (comment) (MD present)  OT Visit Diagnosis: Other abnormalities of gait and mobility (R26.89);Muscle weakness (generalized) (M62.81)                Time: [id number] OT Time Calculation (min): 9 min Charges:  OT General Charges $OT Visit: 1 Visit OT Evaluation $OT Eval Low Complexity: 1 Low  [facility] MS, OTR/L ascom [phone]  03/04/23, 3:35 PM

## 2023-03-05 ENCOUNTER — Other Ambulatory Visit: Payer: Self-pay | Admitting: *Deleted

## 2023-03-05 DIAGNOSIS — K922 Gastrointestinal hemorrhage, unspecified: Secondary | ICD-10-CM | POA: Diagnosis not present

## 2023-03-05 DIAGNOSIS — I10 Essential (primary) hypertension: Secondary | ICD-10-CM

## 2023-03-05 DIAGNOSIS — Z7189 Other specified counseling: Secondary | ICD-10-CM | POA: Diagnosis not present

## 2023-03-05 LAB — COMPREHENSIVE METABOLIC PANEL
ALT: 37 U/L (ref 0–44)
AST: 61 U/L — ABNORMAL HIGH (ref 15–41)
Albumin: 3.2 g/dL — ABNORMAL LOW (ref 3.5–5.0)
Alkaline Phosphatase: 127 U/L — ABNORMAL HIGH (ref 38–126)
Anion gap: 12 (ref 5–15)
BUN: 14 mg/dL (ref 6–20)
CO2: 30 mmol/L (ref 22–32)
Calcium: 8.7 mg/dL — ABNORMAL LOW (ref 8.9–10.3)
Chloride: 94 mmol/L — ABNORMAL LOW (ref 98–111)
Creatinine, Ser: 0.78 mg/dL (ref 0.61–1.24)
GFR, Estimated: >60 mL/min (ref >60)
Glucose, Bld: 170 mg/dL — ABNORMAL HIGH (ref 70–99)
Potassium: 3.2 mmol/L — ABNORMAL LOW (ref 3.5–5.1)
Sodium: 136 mmol/L (ref 135–145)
Total Bilirubin: 1.4 mg/dL — ABNORMAL HIGH (ref 0.3–1.2)
Total Protein: 7.4 g/dL (ref 6.5–8.1)

## 2023-03-05 MED ORDER — POTASSIUM CHLORIDE CRYS ER 20 MEQ PO TBCR
40.0000 meq | EXTENDED_RELEASE_TABLET | Freq: Once | ORAL | Status: AC
  Administered 2023-03-05: 40 meq via ORAL
  Filled 2023-03-05: qty 2

## 2023-03-05 MED ORDER — LACTULOSE 10 GM/15ML PO SOLN: 20.0000 g | Freq: Two times a day (BID) | ORAL | 0 refills | Status: DC

## 2023-03-05 MED ORDER — ADULT MULTIVITAMIN W/MINERALS CH: 1.0000 | ORAL_TABLET | Freq: Every day | ORAL | 11 refills | Status: DC

## 2023-03-05 MED ORDER — HYDROCORTISONE 10 MG PO TABS: 10.0000 mg | ORAL_TABLET | Freq: Every day | ORAL | 3 refills | Status: DC

## 2023-03-05 MED ORDER — NITROGLYCERIN 0.4 MG SL SUBL: 0.4000 mg | SUBLINGUAL_TABLET | SUBLINGUAL | 12 refills | Status: DC | PRN

## 2023-03-05 MED ORDER — QUETIAPINE FUMARATE 25 MG PO TABS: 25.0000 mg | ORAL_TABLET | Freq: Every day | ORAL | 0 refills | Status: DC

## 2023-03-05 MED ORDER — PANTOPRAZOLE SODIUM 40 MG PO TBEC: 40.0000 mg | DELAYED_RELEASE_TABLET | Freq: Two times a day (BID) | ORAL | 11 refills | Status: DC

## 2023-03-05 MED ORDER — ACETAMINOPHEN 500 MG PO TABS: 500.0000 mg | ORAL_TABLET | Freq: Three times a day (TID) | ORAL | 0 refills | Status: DC | PRN

## 2023-03-05 MED ORDER — OXYCODONE HCL 10 MG PO TABS: 10.0000 mg | ORAL_TABLET | Freq: Three times a day (TID) | ORAL | 0 refills | Status: AC | PRN

## 2023-03-05 MED ORDER — TORSEMIDE 40 MG PO TABS: ORAL_TABLET | ORAL | 11 refills | Status: DC

## 2023-03-05 MED ORDER — MIDODRINE HCL 10 MG PO TABS: 10.0000 mg | ORAL_TABLET | Freq: Three times a day (TID) | ORAL | 2 refills | Status: DC

## 2023-03-05 MED ORDER — ROSUVASTATIN CALCIUM 40 MG PO TABS: 40.0000 mg | ORAL_TABLET | Freq: Every day | ORAL | 11 refills | Status: DC

## 2023-03-05 MED ORDER — HYDROCORTISONE 5 MG PO TABS: 5.0000 mg | ORAL_TABLET | Freq: Every day | ORAL | 3 refills | Status: DC

## 2023-03-05 MED ORDER — METHOCARBAMOL 750 MG PO TABS: 750.0000 mg | ORAL_TABLET | Freq: Every day | ORAL | 0 refills | Status: DC | PRN

## 2023-03-05 MED ORDER — VITAMIN B-1 100 MG PO TABS: 100.0000 mg | ORAL_TABLET | Freq: Every day | ORAL | 0 refills | Status: AC

## 2023-03-05 MED ORDER — GABAPENTIN 300 MG PO CAPS: 300.0000 mg | ORAL_CAPSULE | Freq: Three times a day (TID) | ORAL | 2 refills | Status: DC

## 2023-03-05 MED ORDER — SPIRONOLACTONE 25 MG PO TABS: 12.5000 mg | ORAL_TABLET | Freq: Every day | ORAL | 2 refills | Status: DC

## 2023-03-05 NOTE — Progress Notes (Signed)
Discharge instructions reviewed with patient including new medications and followup visits - patient stated he wishes to make his own followup visits, info was highlighted and reviewed with him.  Understanding was verbalized and all questions were answered.  IVs removed without complication; patient tolerated well.  Awaiting ride.

## 2023-03-05 NOTE — Consult Note (Signed)
   [facility] Inpatient Consult   03/05/2023  Ethan Wells 30-Nov-1971 [id number]  Orientation with Natividad Brood, [place] Liaison for review.   Location: [facility] Liaison screened remotely [facility]).   [place] [facility]) [place] Patient: Insurance [facility])    Primary Care Provider: Nickola Major, NP with [facility] and Adult Medicine    Patient screened with a 30 days readmission hospitalization with noted extreme high risk score for unplanned readmission. 5 IP and 1 ED within 6 months.  Chart review for potential [facility]) Care Management service needs for post hospital transition for care coordination.    Plan:  Discharge disposition noted pt home (boarding house) with [facility] services . Will make a referral for [facility] RN to follow for care coordination needs and hospital prevention readmission.   [place] does not replace or interfere with any arrangements made by the Inpatient Transition of Care team.   For questions contact:    Raina Mina, RN, [place] Hours M-F 8:00 am to 5 pm [phone] mobile [phone] [Office toll free line][facility] Office Hours are M-F 8:30 - 5 pm 24 hour nurse advise line [phone] Conceirge  [email or web address]

## 2023-03-05 NOTE — TOC Transition Note (Signed)
Transition of Care [facility]) - CM/SW Discharge Note   Patient Details  Name: Ethan Wells MRN: [id number] Date of Birth: 1971-06-22  Transition of [facility]) CM/SW Contact:  Beverly Sessions, RN Phone Number: 03/05/2023, 11:08 AM   Clinical Narrative:      Patient to discharge today Ethan Wells with [facility] notified of discharge SA resources were already added to AVS Per patient cousin to transport        Patient Goals and CMS Choice      Discharge Placement                         Discharge Plan and Services Additional resources added to the After Visit Summary for                                       Social Determinants of Health (SDOH) Interventions SDOH Screenings   Food Insecurity: No Food Insecurity (02/22/2023)  Housing: Low Risk  (02/22/2023)  Transportation Needs: No Transportation Needs (02/22/2023)  Utilities: Not At Risk (02/22/2023)  Alcohol Screen: Medium Risk (10/01/2022)  Tobacco Use: High Risk (03/03/2023)     Readmission Risk Interventions    02/24/2023    3:12 PM 01/17/2023    3:38 PM 10/22/2022   10:47 AM  Readmission Risk Prevention Plan  Transportation Screening Complete Complete Complete  PCP or Specialist Appt within 3-5 Days   Complete  Social Work Consult for Advance Planning/Counseling   [place]   Not Applicable  Medication Review Press photographer) Complete Complete Complete  SW Recovery Care/Counseling Consult  Not Complete   SW Consult Not Complete Comments  lethargic   Palliative Care Screening Not Applicable Not [place] Not Applicable Not Applicable

## 2023-03-05 NOTE — Progress Notes (Addendum)
Daily Progress Note   Patient Name: Ethan Wells       Date: 03/05/2023 DOB: 11-Oct-1971  Age: 52 y.o. MRN#: [id number] Attending Physician: Val Riles, MD Primary Care Physician: Nickola Major, NP Admit Date: 02/22/2023  Reason for Consultation/Follow-up: Establishing goals of care  Subjective: Notes and labs reviewed.  In to see patient.  Patient is sitting in bed watching TV.  He discusses discharge planning, and concerns for having Seroquel and Trazodone outpatient.  He states he is looking to find a new PCP as he and his current PCP do not agree on aspects of his care.  In regards to goals of care, he states that he tried to call his cousin Donovan Kail to discuss his wishes and time limits on care, but was unable to reach her.  He confirms he would like to continue full code/full scope treatment, but would not want to live in a vegetative state.  Educated him that an advanced directive could be completed if he wishes.  Length of Stay: 11  Current Medications: Scheduled Meds:   ezetimibe  10 mg Oral Daily   feeding supplement (GLUCERNA SHAKE)  237 mL Oral TID BM   ferrous sulfate  325 mg Oral Q breakfast   folic acid  1 mg Oral Daily   gabapentin  300 mg Oral TID   hydrocortisone  20 mg Oral Daily   lactulose  20 g Oral BID   midodrine  10 mg Oral TID WC   multivitamin with minerals  1 tablet Oral Daily   nicotine  21 mg Transdermal Daily   pantoprazole  40 mg Oral BID   QUEtiapine  25 mg Oral QHS   rifaximin  550 mg Oral BID   rosuvastatin  40 mg Oral Daily   sodium chloride flush  10-40 mL Intracatheter Q12H   spironolactone  12.5 mg Oral Daily   thiamine  100 mg Oral Daily   Or   thiamine  100 mg Intravenous Daily   torsemide  40 mg Oral BID    Continuous  Infusions:  sodium chloride Stopped (02/23/23 1806)   lactated ringers      PRN Meds: acetaminophen **OR** acetaminophen, lidocaine, methocarbamol, metoCLOPramide (REGLAN) injection, nitroGLYCERIN, mouth rinse, oxyCODONE, sodium chloride flush, traZODone  Physical Exam Pulmonary:     Effort: Pulmonary  effort is normal.  Neurological:     Mental Status: He is alert.             Vital Signs: BP (!) 97/57 (BP Location: Left Arm)   Pulse 73   Temp 98 F (36.7 C)   Resp 18   Ht 5\' 5"  (1.651 m)   Wt 84.6 kg   SpO2 99%   BMI 31.04 kg/m  SpO2: SpO2: 99 % O2 Device: O2 Device: Room Air O2 Flow Rate: O2 Flow Rate (L/min): 6 L/min  Intake/output summary:  Intake/Output Summary (Last 24 hours) at 03/05/2023 1146 Last data filed at 03/05/2023 1037 Gross per 24 hour  Intake 1671 ml  Output 2280 ml  Net -609 ml   LBM: Last BM Date : 03/03/23 Baseline Weight: Weight: 84.6 kg Most recent weight: Weight: 84.6 kg    Patient Active Problem List   Diagnosis Date Noted   Leg edema 03/01/2023   GI bleeding 02/22/2023   HLD (hyperlipidemia) 02/22/2023   Thrombocytopenia ([place]) 02/22/2023   Adrenal insufficiency (HCC) [id number]   Alcoholic liver disease ([place]) 02/22/2023   Upper GI bleed 02/22/2023   Myocardial injury 01/29/2023   Adrenal insufficiency (Addison's disease) ([place]) 01/28/2023   Reactive thrombocytosis 01/24/2023   Hypophosphatemia 01/22/2023   Acute respiratory failure with hypoxia (HCC) 01/20/2023   Orthostatic hypotension 01/17/2023   Anasarca 01/17/2023   Morbid obesity ([place]) 01/17/2023   Cocaine dependence with cocaine-induced disorder ([place]) 01/17/2023   Rectal bleeding 01/17/2023   Peripheral edema 01/17/2023   History of benzodiazepine use 01/17/2023   Chest pain 11/19/2022   Acute on chronic heart failure with preserved ejection fraction (HFpEF) ([place]) 10/29/2022   Acute respiratory distress 10/29/2022   Elevated rheumatoid factor 10/28/2022   Hyperkalemia  10/25/2022   Hemorrhoids, internal, with bleeding 10/25/2022   Mesenteric artery stenosis (HCC) 10/23/2022   Iron deficiency anemia 10/23/2022   Hematochezia    Abnormal CT scan, colon    Colitis 09/30/2022   Prolonged QT interval 09/30/2022   Alcohol use disorder 08/19/2022   GI bleed 05/28/2022   Non-intractable vomiting    Epigastric pain    Polyp of ascending colon    Acute blood loss anemia 08/07/2021   Acute gastritis    Hyperbilirubinemia    Elevated troponin    Notalgia    History of ST elevation myocardial infarction (STEMI) 12/19/2020   Alcohol dependence ([place]) 12/19/2020   Depression with anxiety 10/05/2020   Esophageal reflux 10/05/2020   Headache 10/05/2020   Hyperlipidemia 10/05/2020   Hyponatremia 06/04/2020   Hypomagnesemia [id number]   Chronic systolic CHF (congestive heart failure) ([place]) 06/04/2020   Leukocytosis 06/04/2020   Abdominal pain 06/04/2020   Hypokalemia 06/04/2020   Ischemic cardiomyopathy    Polysubstance abuse (HCC)    NSTEMI (non-ST elevated myocardial infarction) ([place]) 07/12/2019   Abdominal pain, RUQ 05/25/2018   Chest pain, mid sternal 11/17/2012   Cocaine abuse ([place])    Marijuana abuse    Tobacco abuse 02/26/2011   Essential hypertension 02/26/2011   CAD (coronary artery disease) 02/26/2011   Other specified forms of chronic ischemic heart disease 02/26/2011    Palliative Care Assessment & Plan   Recommendations/Plan: Full code/full scope.  Code Status:    Code Status Orders  (From admission, onward)           Start     Ordered   02/22/23 0558  Full code  Continuous       Question:  By:  Answer:  Consent:  discussion documented in EHR   02/22/23 0559           Code Status History     Date Active Date Inactive Code Status Order ID Comments User Context   01/17/2023 0416 01/31/2023 2220 Full Code [id number]  Athena Masse, MD ED   11/19/2022 1315 11/24/2022 2223 Full Code [id number]  Heath Lark D, DO ED    10/21/2022 2344 11/08/2022 2257 Full Code [id number]  Dibia, Manfred Shirts, MD ED   10/01/2022 0007 10/03/2022 2257 Full Code [id number]  Toy Baker, MD ED   08/19/2022 1520 08/20/2022 0402 Full Code [id number]  Blake Divine, MD ED   05/28/2022 1643 06/01/2022 2047 Full Code [id number]  Collier Bullock, MD ED   08/07/2021 2157 08/11/2021 1902 Full Code [id number]  Athena Masse, MD ED   12/19/2020 2354 12/23/2020 1816 Full Code [id number]  Athena Masse, MD ED   06/04/2020 1914 06/06/2020 1702 Full Code [id number]  Ivor Costa, MD ED   07/12/2019 1246 07/15/2019 1711 Full Code [id number]  Nicholes Mango, MD ED   05/25/2018 0819 05/27/2018 1553 Full Code [id number]  Arta Silence, MD Inpatient      Advance Directive Documentation    Flowsheet Row Most Recent Value  Type of Advance Directive Living will  Pre-existing out of facility DNR order (yellow form or pink MOST form) --  "MOST" Form in Place? --       Prognosis:  Unable to determine   Thank you for allowing the Palliative Medicine Team to assist in the care of this patient.   Asencion Gowda, NP  Please contact Palliative Medicine Team phone at [phone] for questions and concerns.

## 2023-03-05 NOTE — Plan of Care (Signed)
  Problem: Health Behavior/Discharge Planning: Goal: Ability to manage health-related needs will improve Outcome: Progressing   Problem: Clinical Measurements: Goal: Respiratory complications will improve Outcome: Progressing   Problem: Activity: Goal: Risk for activity intolerance will decrease Outcome: Progressing   Problem: Nutrition: Goal: Adequate nutrition will be maintained Outcome: Progressing   Problem: Elimination: Goal: Will not experience complications related to bowel motility Outcome: Progressing   Problem: Elimination: Goal: Will not experience complications related to urinary retention Outcome: Progressing   Problem: Safety: Goal: Ability to remain free from injury will improve Outcome: Progressing   Problem: Pain Managment: Goal: General experience of comfort will improve Outcome: Progressing

## 2023-03-05 NOTE — Progress Notes (Signed)
Physical Therapy Treatment Patient Details Name: Ethan Wells MRN: [id number] DOB: 03-May-1971 Today's Date: 03/05/2023   History of Present Illness presented to ER secondary to coffee-ground hematemesis; admitted for management of GIB with ABLA.  Of note, recent hospitalization (2/1-2/16 due to hemorrhagic shock)    PT Comments    Pt was long sitting in bed upon arrival. He is planning to DC home later this date. He was safely able to exit bed, stand, and ambulate without LOB. Performed ascending/descending 8 stair without difficulty or safety concern. Acute PT recommends HH therapy however pt endorses not wanting PT after DC. Will continue to follow until pt is DC.   Recommendations for follow up therapy are one component of a multi-disciplinary discharge planning process, led by the attending physician.  Recommendations may be updated based on patient status, additional functional criteria and insurance authorization.  Follow Up Recommendations  Home health PT (pt currently refusing HHPT  even when recommended)        Patient can return home with the following A little help with walking and/or transfers;A little help with bathing/dressing/bathroom   Equipment Recommendations  None recommended by PT       Precautions / Restrictions Precautions Precautions: None Restrictions Weight Bearing Restrictions: No     Mobility  Bed Mobility Overal bed mobility: Needs Assistance Bed Mobility: Supine to Sit  Supine to sit: Supervision   Transfers Overall transfer level: Needs assistance Equipment used: None Transfers: Sit to/from Stand Sit to Stand: Supervision   Ambulation/Gait Ambulation/Gait assistance: Supervision Gait Distance (Feet): 160 Feet Assistive device: Rolling walker (2 wheels), None  General Gait Details: no LOB with RW or without AD while ambulating   Stairs Stairs: Yes Stairs assistance: Supervision Stair Management: Step to pattern, Forwards, One  rail Right Number of Stairs: 8 General stair comments: pt demonstrated safe ability to ascend/descend 8 stair    Balance Overall balance assessment: Needs assistance Sitting-balance support: No upper extremity supported, Feet supported Sitting balance-Leahy Scale: Good     Standing balance support: No upper extremity supported, During functional activity Standing balance-Leahy Scale: Fair     Cognition Arousal/Alertness: Awake/alert Behavior During Therapy: WFL for tasks assessed/performed Overall Cognitive Status: Within Functional Limits for tasks assessed    General Comments: Pt is A and O x 4               Pertinent Vitals/Pain Pain Assessment Pain Assessment: No/denies pain     PT Goals (current goals can now be found in the care plan section) Acute Rehab PT Goals Patient Stated Goal: none stated Progress towards PT goals: Progressing toward goals    Frequency    Min 2X/week      PT Plan Current plan remains appropriate       AM-PAC PT "6 Clicks" Mobility   Outcome Measure  Help needed turning from your back to your side while in a flat bed without using bedrails?: None Help needed moving from lying on your back to sitting on the side of a flat bed without using bedrails?: None Help needed moving to and from a bed to a chair (including a wheelchair)?: A Little Help needed standing up from a chair using your arms (e.g., wheelchair or bedside chair)?: A Little Help needed to walk in hospital room?: A Little Help needed climbing 3-5 steps with a railing? : A Little 6 Click Score: 20    End of Session   Activity Tolerance: Patient tolerated treatment well Patient left: in  bed;with call bell/phone within reach Nurse Communication: Mobility status PT Visit Diagnosis: Muscle weakness (generalized) (M62.81);Difficulty in walking, not elsewhere classified (R26.2)     Time: [id number] PT Time Calculation (min) (ACUTE ONLY): 26 min  Charges:  $Gait  Training: 8-22 mins $Therapeutic Activity: 8-22 mins                    Julaine Fusi PTA 03/05/23, 12:54 PM

## 2023-03-05 NOTE — Discharge Summary (Signed)
[facility] Discharge Summary   [name] [id number]  PCP: Nickola Major, NP  Date of admission: 02/22/2023   Date of discharge:  03/05/2023     Discharge Diagnoses:  Principal Problem:   GI bleeding Active Problems:   Acute blood loss anemia   Adrenal insufficiency (HCC)   CAD (coronary artery disease)   Myocardial injury   Hypomagnesemia   Chronic systolic CHF (congestive heart failure) (HCC)   HLD (hyperlipidemia)   Thrombocytopenia (HCC)   Essential hypertension   Depression with anxiety   Polysubstance abuse ([place])   Morbid obesity ([place])   Alcoholic liver disease ([place])   Upper GI bleed   Leg edema   Admitted From: Home Disposition:  Home with HHPT  Recommendations for Outpatient Follow-up:  F/u PCP in 1 wk, repeat CBC and BMP in 1-2 weeks  F/u GI in 1 -2 wk, needs repeat EGD and may need TIPS procedure F/u Cardio in 1-2 weeks Follow up LABS/TEST:  As above   Diet recommendation: Cardiac diet  Activity: The patient is advised to gradually reintroduce usual activities, as tolerated  Discharge Condition: stable  Code Status: Full code   History of present illness: As per the H and P dictated on admission Hospital Course:  Ethan Wells is a 52 y.o. male with medical history significant of polysubstance abuse (alcohol, tobacco, cocaine, marijuana), GI bleeding, hypertension, hyperlipidemia, CAD, STEMI with BMS placement, CHF, depression with anxiety, adrenal insufficiency, cardiac arrest due to V-fib in the setting of STEMI, who presents with GI bleeding. Patient was recently hospitalized from 2/1 - 2/16 due to GI bleeding related hemorrhagic shock. Pt states that he started having coffee-ground hematemesis again since yesterday.  He states that it totally he has had more than 20 times of hematemesis.  He also has black tarry stool.  Patient has nausea and abdominal pain.  His abdominal pain is diffuse, constant, aching, mild to  moderate, nonradiating. He has mild shortness breath and chest discomfort, no cough. he reports possible syncope but he is uncertain about this.    Patient was found to be hypotensive initially, with blood pressure 74/56, which improved to 92/53 after giving 3 L LR in ED. ED w/up: Hb 10.7 on 01/29/2023 --> 7.5, troponin 46, WBC 11.6, INR 1.6, PTT 33, temperature normal, heart rate 100-120s, RR 30 --> 14, oxygen saturation 98% on room air.   EKG: Sinus rhythm, QTc 471, LAE, early R wave progression, frequent PVC.  Patient was admitted to stepdown as inpatient.  Dr. Marius Ditch of GI is consulted.    Assessment and Plan: # Acute GI bleeding # Acute blood loss anemia S/p 2 unit of PRBC transfusion, Hb 8.4 today, stable. Anemia workup within normal range. S/p IV ceftriaxone x 7 days for SBP prophylaxis. S/p IV fluid bolus. S/p pantoprazole IV infusion, switch to pantoprazole IV push and then pantoprazole 40 mg p.o. twice daily as patient lost IV on 3/14. S/p octreotide IV infusion DC'd on 3/14. GI consulted, EGD could not be done due to urine tox positive for cocaine. 3/12 UDS positive for cocaine and benzo, GI postponed EGD for now. 3/13 UDS positive for cocaine, GI recommended to repeat UDS on Friday 3/15 UDS negative for cocaine, plan for EGD tomorrow a.m. 3/16 s/p EGD, esophageal varices, banding done by GI.  Recommended not a candidate for beta-blocker due to cocaine abuse and patient will benefit from TIPS procedure due to recurrent variceal bleeding. Octreotide bolus was given and s/p  infusion x 72 hours, no need of antibiotics, patient ready completed ceftriaxone for 7 days. Patient is not ready for TIPS procedure at this time, would like to be discharged home and repeat EGD after 2 months and then he will decide for TIPS procedure. Patient was discharged on pantoprazole 40 mg p.o. twice daily.  # Liver cirrhosis due to alcohol abuse S/p rifaximin 550 mg p.o. twice daily for hepatic encephalopathy.  Continue lactulose Ammonia 50, titrate to 1-2 BM per day. 3/15 [place] abd Trace ascites, no safe window for paracentesis. paracentesis was not performed. 3/15 KUB No evidence of bowel obstruction. S/p EGD as above, GI recommended TIPS procedure by IR.   Patient would like to be discharged, repeat EGD after 2 months and then he will plan for TIPS procedure, patient is not ready at this time. # EtOH abuse, s/p CIWA protocol, no withdrawal symptoms. EtOH abstinence counseling done. # Hypokalemia, potassium repleted # Hypophosphatemia, Phos repleted. Resolved # Hypomagnesemia, mag repleted. Resolved # Adrenal insufficiency, s/p Solu-Cortef 100 mg x 2 doses as stress doses.  Started Cortef 10 mg p.o. in a.m. and 5 mg in the p.m. on discharge.  Patient received Cortef 20 mg daily during hospital stay.  Continued midodrine 10 mg po TID, monitor BP at home # CAD (coronary artery disease) and myocardial injury: Troponin 46.  History of STEMI with BMS placement. -Will not give aspirin due to GI bleeding.  Continue Crestor, d/c'd zetia. Lipid profile stable. Beta-blocker was previously held in the setting of chronic cocaine use. Troponin 172--101 trended down. A1c 5.6, 3/16 discussed with cardiology, as patient had chest pain after endoscopy and variceal banding. # Chronic systolic CHF (congestive heart failure) [facility]): Patient had history of systolic CHF.  2D echo on 07/13/2019 showed EF of 45-50%.  His EF has improved in recent years.  2D echo on 01/17/2023 showed EF of 50-55%. Held diuretics on admission due to hypotension. BNP 207. On 3/16 developed lower extremity edema could be due to liver cirrhosis, hypoalbuminemia and anemia.  S/p Lasix IV infusion due to anasarca. On 3/19 started torsemide 40 mg p.o. BID, and spironolactone as per cardiology.  Patient was recommended to follow with cardiology in 1 to 2 weeks, repeat BMP after 1 week HLD (hyperlipidemia) continue Crestor. D/c'd Zetia Lipid profile LDL 62,  triglyceride 53, within normal range Thrombocytopenia ([place]): This is a chronic issue.  Platelet 103 on 11/21/2022, 158 on 01/29/2023.  platelet 108--50.  Likely due to alcohol abuse and possible liver cirrhosis. Repeat CBC in 1 wk Essential hypertension, discontinued antihypertensive medications.  Continue midodrine due to hypotension and follow with PCP for further management. Depression with anxiety, Seroquel Polysubstance abuse: Including cocaine, tobacco, alcohol, marijuana Did counseling about importance of quitting substance use. S/p Nicotine patch s/p CIWA protocol. UDS positive for cocaine  Body mass index is 31.04 kg/m.  Nutrition Interventions:    Pain control  - [place] Controlled Substance Reporting System database was reviewed, but website was not working so could not review details. -Oxycodone 15 tablets prescribed. - Patient was instructed, not to drive, operate heavy machinery, perform activities at heights, swimming or participation in water activities or provide baby sitting services while on Pain, Sleep and Anxiety Medications; until his outpatient Physician has advised to do so again.  - Also recommended to not to take more than prescribed Pain, Sleep and Anxiety Medications.  Patient was seen by physical therapy, who recommended Home health, which was arranged. On the day of the  discharge the patient's vitals were stable, and no other acute medical condition were reported by patient. the patient was felt safe to be discharge at Home with Home health.  Consultants: GI, Cardiology and palliative care Procedures: EGD esophageal variceal banding   Discharge Exam: General: Appear in no distress, no Rash; Oral Mucosa Clear, moist. Cardiovascular: S1 and S2 Present, no Murmur, Respiratory: normal respiratory effort, Bilateral Air entry present and no Crackles, no wheezes Abdomen: Bowel Sound present, Soft, distended, obese, and no tenderness, no hernia Extremities: 2+  Pedal edema, no calf tenderness Neurology: alert and oriented to time, place, and person affect appropriate.  Filed Weights   02/22/23 0905  Weight: 84.6 kg   Vitals:   03/05/23 0518 03/05/23 0833  BP: 96/60 (!) 97/57  Pulse: 74 73  Resp: 18 18  Temp: 97.9 F (36.6 C) 98 F (36.7 C)  SpO2: 98% 99%    DISCHARGE MEDICATION: Allergies as of 03/05/2023       Reactions   Penicillins Hives   Tolerated Ceftriaxone 10/2022   Other Rash   chives        Medication List     STOP taking these medications    ezetimibe 10 MG tablet Commonly known as: ZETIA   ferrous sulfate 325 (65 FE) MG tablet   folic acid 1 MG tablet Commonly known as: FOLVITE   furosemide 40 MG tablet Commonly known as: LASIX   isosorbide mononitrate 30 MG 24 hr tablet Commonly known as: IMDUR       TAKE these medications    acetaminophen 500 MG tablet Commonly known as: TYLENOL Take 1 tablet (500 mg total) by mouth every 8 (eight) hours as needed for mild pain, fever or headache. What changed:  how much to take when to take this reasons to take this   gabapentin 300 MG capsule Commonly known as: NEURONTIN Take 1 capsule (300 mg total) by mouth 3 (three) times daily.   hydrocortisone 10 MG tablet Commonly known as: CORTEF Take 1 tablet (10 mg total) by mouth daily. What changed: Another medication with the same name was changed. Make sure you understand how and when to take each.   hydrocortisone 5 MG tablet Commonly known as: CORTEF Take 1 tablet (5 mg total) by mouth daily after lunch. What changed:  medication strength how much to take when to take this   lactulose 10 GM/15ML solution Commonly known as: CHRONULAC Take 30 mLs (20 g total) by mouth 2 (two) times daily. Titrate to 1-2 BM per day   methocarbamol 750 MG tablet Commonly known as: ROBAXIN Take 1 tablet (750 mg total) by mouth daily as needed for muscle spasms.   midodrine 10 MG tablet Commonly known as:  PROAMATINE Take 1 tablet (10 mg total) by mouth 3 (three) times daily with meals. Skip the dose if Systolic BP greater than [id number] mmHg and or HR greater than 100 What changed: additional instructions   multivitamin with minerals Tabs tablet Take 1 tablet by mouth daily. Start taking on: March 06, 2023   nitroGLYCERIN 0.4 MG SL tablet Commonly known as: NITROSTAT Place 1 tablet (0.4 mg total) under the tongue every 5 (five) minutes as needed for chest pain.   Oxycodone HCl 10 MG Tabs Take 1 tablet (10 mg total) by mouth every 8 (eight) hours as needed for up to 5 days. What changed: reasons to take this   pantoprazole 40 MG tablet Commonly known as: PROTONIX Take 1 tablet (40 mg total)  by mouth 2 (two) times daily. What changed: when to take this   QUEtiapine 25 MG tablet Commonly known as: SEROQUEL Take 1 tablet (25 mg total) by mouth at bedtime.   rosuvastatin 40 MG tablet Commonly known as: CRESTOR Take 1 tablet (40 mg total) by mouth daily.   spironolactone 25 MG tablet Commonly known as: ALDACTONE Take 0.5 tablets (12.5 mg total) by mouth daily. Start taking on: March 06, 2023   thiamine 100 MG tablet Commonly known as: Vitamin B-1 Take 1 tablet (100 mg total) by mouth daily.   Torsemide 40 MG Tabs Take 40 mg by mouth 2 (two) times daily for 14 days, THEN 40 mg daily. Start taking on: March 05, 2023 What changed:  medication strength See the new instructions.       Allergies  Allergen Reactions   Penicillins Hives    Tolerated Ceftriaxone 10/2022   Other Rash    chives   Discharge Instructions     Call MD for:   Complete by: As directed    GI bleeding   Call MD for:  difficulty breathing, headache or visual disturbances   Complete by: As directed    Call MD for:  extreme fatigue   Complete by: As directed    Call MD for:  persistant dizziness or light-headedness   Complete by: As directed    Call MD for:  persistant nausea and vomiting   Complete  by: As directed    Call MD for:  severe uncontrolled pain   Complete by: As directed    Call MD for:  temperature >100.4   Complete by: As directed    Diet - low sodium heart healthy   Complete by: As directed    Discharge instructions   Complete by: As directed    F/u PCP in 1 wk, repeat CBC and BMP in 1-2 weeks  F/u GI in 1 -2 wk, needs repeat EGD and may need TIPS procedure F/u Cardio in 1-2 weeks   Increase activity slowly   Complete by: As directed        The results of significant diagnostics from this hospitalization (including imaging, microbiology, ancillary and laboratory) are listed below for reference.    Significant Diagnostic Studies: DG Abd 1 View  Result Date: 02/28/2023 CLINICAL DATA:  Abdominal distension EXAM: ABDOMEN - 1 VIEW COMPARISON:  Radiograph 01/18/2023 FINDINGS: Nonobstructive bowel gas pattern. No radiopaque calculi overlie the kidneys. There is distension of the stomach with presumably ingested material. IMPRESSION: No evidence of bowel obstruction. Electronically Signed   By: Maurine Simmering M.D.   On: 02/28/2023 16:17   [place] ASCITES (ABDOMEN LIMITED)  Result Date: 02/28/2023 CLINICAL DATA:  52 year old male with history of ascites presenting for paracentesis. EXAM: LIMITED ABDOMEN ULTRASOUND FOR ASCITES TECHNIQUE: Limited ultrasound survey for ascites was performed in all four abdominal quadrants. COMPARISON:  None Available. FINDINGS: Trace perihepatic ascites. Trace ascites in the left lower quadrant no ascites in the right lower and left upper quadrants. IMPRESSION: Trace ascites, no safe window for paracentesis. No paracentesis was performed. Ruthann Cancer, MD [facility] Electronically Signed   By: Ruthann Cancer M.D.   On: 02/28/2023 15:15    Microbiology: No results found for this or any previous visit (from the past 240 hour(s)).   Labs: CBC: Recent Labs  Lab 02/28/23 0445 03/01/23 0340  03/02/23 0330 03/03/23 0340 03/04/23 0640  WBC 5.9 5.3 5.8 6.4 5.4  HGB 8.1* 7.9* 8.4*  7.8* 8.4*  HCT 25.9* 25.3* 26.8* 24.9* 26.6*  MCV 95.9 95.5 94.7 94.3 95.0  PLT 77* 82* 98* 104* 123XX123*   Basic Metabolic Panel: Recent Labs  Lab 02/27/23 0558 02/28/23 0445 03/01/23 0340 03/02/23 0330 03/03/23 0340 03/04/23 0640  NA 136 140 139 137 139 141  K 4.0 4.1 3.9 3.3* 3.3* 3.4*  CL 112* 110 110 105 101 104  CO2 20* 20* 24 24 30 29   GLUCOSE 112* 108* 111* 168* 137* 133*  BUN 5* 7 6 6 12 12   CREATININE 0.58* 0.57* 0.56* 0.62 0.63 0.62  CALCIUM 8.0* 8.5* 8.3* 8.4* 8.3* 8.4*  MG 1.8 1.8 1.7 1.7  --   --   PHOS 2.6 2.7 3.1 3.1  --   --    Liver Function Tests: Recent Labs  Lab 02/28/23 0445 03/01/23 0340 03/02/23 0330 03/03/23 0340 03/04/23 0640  AST 44* 61* 61* 44* 63*  ALT 21 26 30 24  36  ALKPHOS 129* 134* 139* 117 114  BILITOT 1.3* 1.1 1.3* 1.2 1.0  PROT 5.7* 5.5* 6.7 6.1* 6.2*  ALBUMIN 2.5* 2.5* 2.9* 2.5* 2.7*   No results for input(s): "LIPASE", "AMYLASE" in the last 168 hours. Recent Labs  Lab 02/27/23 0558 02/28/23 0445  AMMONIA 50* 50*   Cardiac Enzymes: No results for input(s): "CKTOTAL", "CKMB", "CKMBINDEX", "TROPONINI" in the last 168 hours. BNP (last 3 results) Recent Labs    01/18/23 0204 01/20/23 0328 02/22/23 0450  BNP 1,554.2* 2,540.3* 207.2*   CBG: No results for input(s): "GLUCAP" in the last 168 hours.  Time spent: 35 minutes  Signed:  Val Riles  [facility]  03/05/2023 12:12 PM

## 2023-03-06 ENCOUNTER — Telehealth: Payer: Self-pay | Admitting: *Deleted

## 2023-03-06 ENCOUNTER — Telehealth: Payer: Self-pay

## 2023-03-06 NOTE — Transitions of Care (Post Inpatient/ED Visit) (Signed)
   03/06/2023  Name: Ethan Wells MRN: [id number] DOB: August 18, 1971  Today's TOC FU Call Status: Today's TOC FU Call Status:: Unsuccessul Call (1st Attempt) Unsuccessful Call (1st Attempt) Date: 03/06/23  Attempted to reach the patient regarding the most recent Inpatient/ED visit.  Follow Up Plan: No further outreach attempts will be made at this time. We have been unable to contact the patient.   Enzo Montgomery, RN,BSN,CCM [facility] Care Management Care Management Community Coordinator Direct Phone: [phone] Toll Free: [phone] Fax: [phone]

## 2023-03-06 NOTE — Progress Notes (Signed)
  Care Coordination   Note   03/06/2023 Name: [name] MRN: [id number] DOB: 01/10/1971  Ethan Wells is a 52 y.o. year old male who sees Medina-Vargas, Monina C, NP for primary care. I reached out to Ethan Wells by phone today to offer care coordination services.  [name] was given information about Care Coordination services today including:   The Care Coordination services include support from the care team which includes your Nurse Coordinator, Clinical Social Worker, or Pharmacist.  The Care Coordination team is here to help remove barriers to the health concerns and goals most important to you. Care Coordination services are voluntary, and the patient may decline or stop services at any time by request to their care team member.   Care Coordination Consent Status: Patient agreed to services and verbal consent obtained.   Follow up plan:  Telephone appointment with care coordination team member scheduled for:  03/07/23 with SW and 03/10/23 with RNCM   Encounter Outcome:  Pt. Scheduled  [place]  Direct Dial: [phone]

## 2023-03-07 ENCOUNTER — Ambulatory Visit: Payer: Self-pay

## 2023-03-07 ENCOUNTER — Telehealth: Payer: Self-pay | Admitting: *Deleted

## 2023-03-07 ENCOUNTER — Telehealth: Payer: Self-pay

## 2023-03-07 DIAGNOSIS — Z8719 Personal history of other diseases of the digestive system: Secondary | ICD-10-CM

## 2023-03-07 NOTE — Patient Outreach (Signed)
  Care Coordination   Initial Visit Note   03/07/2023 Name: KANTA HARTKE MRN: [id number] DOB: 10-25-1971  [name] is a 52 y.o. year old male who sees Medina-Vargas, Monina C, NP for primary care. I spoke with  [name] by phone today.  What matters to the patients health and wellness today?  Patient needs transportation to 3/28 appointment    Goals Addressed             This Visit's Progress    COMPLETED: Care Coordination Activities       Care Coordination Interventions: Discussed patient is in need of transportation assistance to [place] appointment on 3/28 Performed chart review to note patient has a [facility] plan which does offer transportation benefits Determined the patient has used this service before and will need assistance arranging this trip Discussed plan for SW to refer patient to the [place] team to assist with arranging transportation         SDOH assessments and interventions completed:  Yes  SDOH Interventions Today    Flowsheet Row Most Recent Value  SDOH Interventions   Housing Interventions Intervention Not Indicated  Transportation Interventions AMB Referral  [Needs assistance booking transportation to 3/28 appt. Referral to care guide team]  Utilities Interventions Intervention Not Indicated        Care Coordination Interventions:  Yes, provided   Interventions Today    Flowsheet Row Most Recent Value  Chronic Disease   Chronic disease during today's visit Hypertension (HTN), Other  General Interventions   General Interventions Discussed/Reviewed General Interventions Discussed, Hospital doctor to [place]        Follow up plan: Referral made to [employer] team.    Encounter Outcome:  Pt. Visit Completed   Daneen Schick, Arita Miss, CDP Social Worker, Certified Dementia Practitioner [place] [facility]  Care  Coordination [phone]

## 2023-03-07 NOTE — Patient Instructions (Signed)
Visit Information  Thank you for taking time to visit with me today. Please don't hesitate to contact me if I can be of assistance to you.   Following are the goals we discussed today:   Goals Addressed             This Visit's Progress    COMPLETED: Care Coordination Activities       Care Coordination Interventions: Discussed patient is in need of transportation assistance to [place] appointment on 3/28 Performed chart review to note patient has a [employer] plan which does offer transportation benefits Determined the patient has used this service before and will need assistance arranging this trip Discussed plan for SW to refer patient to the [place] team to assist with arranging transportation         If you are experiencing a Mental Health or [place] or need someone to talk to, please call 911  The patient verbalized understanding of instructions, educational materials, and care plan provided today and DECLINED offer to receive copy of patient instructions, educational materials, and care plan.   Daneen Schick, BSW, CDP Social Worker, Certified Dementia Practitioner [place] [facility]  Care Coordination [phone]

## 2023-03-07 NOTE — Transitions of Care (Post Inpatient/ED Visit) (Signed)
   03/07/2023  Name: [name] MRN: [id number] DOB: 10-18-1971  Today's TOC FU Call Status: Today's TOC FU Call Status:: Unsuccessful Call (3rd Attempt) Unsuccessful Call (3rd Attempt) Date: 03/07/23  Attempted to reach the patient regarding the most recent Inpatient/ED visit.  Follow Up Plan: No further outreach attempts will be made at this time. We have been unable to contact the patient.     Enzo Montgomery, RN,BSN,CCM [facility] Care Management Care Management Community Coordinator Direct Phone: [phone] Toll Free: [phone] Fax: [phone]

## 2023-03-07 NOTE — Transitions of Care (Post Inpatient/ED Visit) (Signed)
   03/07/2023  Name: Ethan Wells MRN: [id number] DOB: [birth year]  Today's TOC FU Call Status: Today's TOC FU Call Status:: Unsuccessful Call (2nd Attempt) Unsuccessful Call (2nd Attempt) Date: 03/07/23  Attempted to reach the patient regarding the most recent Inpatient/ED visit.  Follow Up Plan: Additional outreach attempts will be made to reach the patient to complete the Transitions of Care (Post Inpatient/ED visit) call.     Enzo Montgomery, RN,BSN,CCM [facility] Care Management Care Management Community Coordinator Direct Phone: [phone] Toll Free: [phone] Fax: [phone]

## 2023-03-07 NOTE — Telephone Encounter (Signed)
   Telephone encounter was:  Successful.  03/07/2023 Name: Ethan Wells MRN: [id number] DOB: 04/24/1971  Ethan Wells is a 52 y.o. year old male who is a primary care patient of Medina-Vargas, Monina C, NP . The community resource team was consulted for assistance with Transportation Needs   Care guide performed the following interventions: Patient provided with information about care guide support team and interviewed to confirm resource needs.  Follow Up Plan:  patient had no interest in setting up a time to schedule transportation he was out driving and I asked for a convenient time to call back and he hung up.   [place] [phone] [address] , [place] [address] Email : [email or web address]

## 2023-03-10 ENCOUNTER — Ambulatory Visit: Payer: Self-pay

## 2023-03-10 NOTE — Patient Outreach (Signed)
  Care Coordination   03/10/2023 Name: Ethan Wells MRN: [id number] DOB: 06-Apr-1971   Care Coordination Outreach Attempts:  An unsuccessful telephone outreach was attempted for a scheduled appointment today.  Follow Up Plan:  Additional outreach attempts will be made to offer the patient care coordination information and services.   Encounter Outcome:  No Answer   Care Coordination Interventions:  No, not indicated    Barb Merino, RN, BSN, CCM Care Management Coordinator [facility]  Direct Phone: [phone]

## 2023-03-11 ENCOUNTER — Telehealth: Payer: Self-pay | Admitting: *Deleted

## 2023-03-11 NOTE — Telephone Encounter (Signed)
   Telephone encounter was:  Unsuccessful.  03/11/2023 Name: Ethan Wells MRN: [id number] DOB: 1971-06-25  Unsuccessful outbound call made today to assist with:  Transportation Needs   Outreach Attempt:  2nd Attempt  A HIPAA compliant voice message was left requesting a return call.  Instructed patient to call back at [phone].  [place] [phone] [address] , [place] [address] Email : [email or web address]

## 2023-03-11 NOTE — Progress Notes (Signed)
  Care Coordination Note  03/11/2023 Name: [name] MRN: [id number] DOB: 12-29-1970  Ethan Wells is a 52 y.o. year old male who is a primary care patient of [place], [place], NP and is actively engaged with the care management team. I reached out to Ethan Wells by phone today to assist with re-scheduling an initial visit with the RN Case Manager  Follow up plan: Unsuccessful telephone outreach attempt made. A HIPAA compliant phone message was left for the patient providing contact information and requesting a return call.   [place]  Direct Dial: [phone]

## 2023-03-13 ENCOUNTER — Encounter: Payer: Medicare HMO | Admitting: Adult Health

## 2023-03-13 NOTE — Progress Notes (Signed)
This encounter was created in error - please disregard.

## 2023-03-17 ENCOUNTER — Other Ambulatory Visit (HOSPITAL_COMMUNITY): Payer: Self-pay

## 2023-03-17 NOTE — Progress Notes (Signed)
  Care Coordination Note  03/17/2023 Name: TIONNE ZERVAS MRN: [id number] DOB: [birth year]  [name] is a 53 y.o. year old male who is a primary care patient of [place], [place], NP and is actively engaged with the care management team. I reached out to [name] by phone today to assist with re-scheduling an initial visit with the RN Case Manager  Follow up plan: Unsuccessful telephone outreach attempt made. A HIPAA compliant phone message was left for the patient providing contact information and requesting a return call.  We have been unable to make contact with the patient for follow up. The care management team is available to follow up with the patient after provider conversation with the patient regarding recommendation for care management engagement and subsequent re-referral to the care management team.  [place]  Direct Dial: [phone]

## 2023-04-07 ENCOUNTER — Other Ambulatory Visit: Payer: Self-pay

## 2023-04-07 ENCOUNTER — Inpatient Hospital Stay
Admission: EM | Admit: 2023-04-07 | Discharge: 2023-04-07 | DRG: 432 | Disposition: A | Discharge status: Other Acute Inpatient Hospital | Payer: Medicare HMO | Attending: Internal Medicine | Admitting: Internal Medicine

## 2023-04-07 ENCOUNTER — Inpatient Hospital Stay (HOSPITAL_COMMUNITY)
Admission: RE | Admit: 2023-04-07 | Discharge: 2023-04-29 | DRG: 405 | Disposition: A | Discharge status: Home / Self Care | Payer: Medicare HMO | Source: Other Acute Inpatient Hospital | Attending: Internal Medicine | Admitting: Internal Medicine

## 2023-04-07 ENCOUNTER — Emergency Department: Payer: Medicare HMO

## 2023-04-07 ENCOUNTER — Encounter (HOSPITAL_COMMUNITY): Payer: Self-pay

## 2023-04-07 DIAGNOSIS — Z8249 Family history of ischemic heart disease and other diseases of the circulatory system: Secondary | ICD-10-CM

## 2023-04-07 DIAGNOSIS — E2740 Unspecified adrenocortical insufficiency: Secondary | ICD-10-CM | POA: Diagnosis present

## 2023-04-07 DIAGNOSIS — K2270 Barrett's esophagus without dysplasia: Secondary | ICD-10-CM | POA: Diagnosis present

## 2023-04-07 DIAGNOSIS — I255 Ischemic cardiomyopathy: Secondary | ICD-10-CM | POA: Diagnosis present

## 2023-04-07 DIAGNOSIS — E785 Hyperlipidemia, unspecified: Secondary | ICD-10-CM | POA: Diagnosis present

## 2023-04-07 DIAGNOSIS — R509 Fever, unspecified: Secondary | ICD-10-CM | POA: Diagnosis not present

## 2023-04-07 DIAGNOSIS — K449 Diaphragmatic hernia without obstruction or gangrene: Secondary | ICD-10-CM | POA: Diagnosis not present

## 2023-04-07 DIAGNOSIS — Z91018 Allergy to other foods: Secondary | ICD-10-CM

## 2023-04-07 DIAGNOSIS — Z8719 Personal history of other diseases of the digestive system: Secondary | ICD-10-CM

## 2023-04-07 DIAGNOSIS — K7689 Other specified diseases of liver: Secondary | ICD-10-CM | POA: Diagnosis not present

## 2023-04-07 DIAGNOSIS — Z811 Family history of alcohol abuse and dependence: Secondary | ICD-10-CM | POA: Diagnosis not present

## 2023-04-07 DIAGNOSIS — I2510 Atherosclerotic heart disease of native coronary artery without angina pectoris: Secondary | ICD-10-CM | POA: Diagnosis not present

## 2023-04-07 DIAGNOSIS — F1210 Cannabis abuse, uncomplicated: Secondary | ICD-10-CM | POA: Diagnosis not present

## 2023-04-07 DIAGNOSIS — F1410 Cocaine abuse, uncomplicated: Secondary | ICD-10-CM | POA: Diagnosis present

## 2023-04-07 DIAGNOSIS — Z955 Presence of coronary angioplasty implant and graft: Secondary | ICD-10-CM

## 2023-04-07 DIAGNOSIS — Z4659 Encounter for fitting and adjustment of other gastrointestinal appliance and device: Secondary | ICD-10-CM | POA: Diagnosis not present

## 2023-04-07 DIAGNOSIS — D689 Coagulation defect, unspecified: Secondary | ICD-10-CM | POA: Diagnosis not present

## 2023-04-07 DIAGNOSIS — I252 Old myocardial infarction: Secondary | ICD-10-CM

## 2023-04-07 DIAGNOSIS — I509 Heart failure, unspecified: Secondary | ICD-10-CM | POA: Diagnosis not present

## 2023-04-07 DIAGNOSIS — D684 Acquired coagulation factor deficiency: Secondary | ICD-10-CM | POA: Diagnosis present

## 2023-04-07 DIAGNOSIS — Z8674 Personal history of sudden cardiac arrest: Secondary | ICD-10-CM

## 2023-04-07 DIAGNOSIS — K922 Gastrointestinal hemorrhage, unspecified: Secondary | ICD-10-CM | POA: Diagnosis present

## 2023-04-07 DIAGNOSIS — R1111 Vomiting without nausea: Secondary | ICD-10-CM | POA: Diagnosis not present

## 2023-04-07 DIAGNOSIS — E870 Hyperosmolality and hypernatremia: Secondary | ICD-10-CM | POA: Diagnosis not present

## 2023-04-07 DIAGNOSIS — F17210 Nicotine dependence, cigarettes, uncomplicated: Secondary | ICD-10-CM | POA: Diagnosis present

## 2023-04-07 DIAGNOSIS — E669 Obesity, unspecified: Secondary | ICD-10-CM | POA: Diagnosis present

## 2023-04-07 DIAGNOSIS — R443 Hallucinations, unspecified: Secondary | ICD-10-CM | POA: Diagnosis not present

## 2023-04-07 DIAGNOSIS — I8511 Secondary esophageal varices with bleeding: Secondary | ICD-10-CM | POA: Diagnosis present

## 2023-04-07 DIAGNOSIS — K7682 Hepatic encephalopathy: Secondary | ICD-10-CM | POA: Diagnosis not present

## 2023-04-07 DIAGNOSIS — Z79899 Other long term (current) drug therapy: Secondary | ICD-10-CM | POA: Diagnosis not present

## 2023-04-07 DIAGNOSIS — K7460 Unspecified cirrhosis of liver: Secondary | ICD-10-CM | POA: Diagnosis not present

## 2023-04-07 DIAGNOSIS — I110 Hypertensive heart disease with heart failure: Secondary | ICD-10-CM | POA: Diagnosis present

## 2023-04-07 DIAGNOSIS — Z880 Allergy status to penicillin: Secondary | ICD-10-CM | POA: Diagnosis not present

## 2023-04-07 DIAGNOSIS — Z7952 Long term (current) use of systemic steroids: Secondary | ICD-10-CM

## 2023-04-07 DIAGNOSIS — E872 Acidosis, unspecified: Secondary | ICD-10-CM | POA: Diagnosis present

## 2023-04-07 DIAGNOSIS — F32A Depression, unspecified: Secondary | ICD-10-CM | POA: Diagnosis present

## 2023-04-07 DIAGNOSIS — E876 Hypokalemia: Secondary | ICD-10-CM | POA: Diagnosis not present

## 2023-04-07 DIAGNOSIS — J9601 Acute respiratory failure with hypoxia: Secondary | ICD-10-CM | POA: Diagnosis not present

## 2023-04-07 DIAGNOSIS — R739 Hyperglycemia, unspecified: Secondary | ICD-10-CM | POA: Diagnosis not present

## 2023-04-07 DIAGNOSIS — F1310 Sedative, hypnotic or anxiolytic abuse, uncomplicated: Secondary | ICD-10-CM | POA: Diagnosis present

## 2023-04-07 DIAGNOSIS — I959 Hypotension, unspecified: Secondary | ICD-10-CM | POA: Diagnosis present

## 2023-04-07 DIAGNOSIS — G629 Polyneuropathy, unspecified: Secondary | ICD-10-CM

## 2023-04-07 DIAGNOSIS — Z91199 Patient's noncompliance with other medical treatment and regimen due to unspecified reason: Secondary | ICD-10-CM | POA: Diagnosis not present

## 2023-04-07 DIAGNOSIS — K7030 Alcoholic cirrhosis of liver without ascites: Principal | ICD-10-CM | POA: Diagnosis present

## 2023-04-07 DIAGNOSIS — K3189 Other diseases of stomach and duodenum: Secondary | ICD-10-CM | POA: Diagnosis present

## 2023-04-07 DIAGNOSIS — D500 Iron deficiency anemia secondary to blood loss (chronic): Secondary | ICD-10-CM | POA: Diagnosis present

## 2023-04-07 DIAGNOSIS — E782 Mixed hyperlipidemia: Secondary | ICD-10-CM

## 2023-04-07 DIAGNOSIS — F1010 Alcohol abuse, uncomplicated: Secondary | ICD-10-CM | POA: Diagnosis present

## 2023-04-07 DIAGNOSIS — F418 Other specified anxiety disorders: Secondary | ICD-10-CM | POA: Diagnosis not present

## 2023-04-07 DIAGNOSIS — K7031 Alcoholic cirrhosis of liver with ascites: Secondary | ICD-10-CM | POA: Diagnosis not present

## 2023-04-07 DIAGNOSIS — F1020 Alcohol dependence, uncomplicated: Secondary | ICD-10-CM | POA: Diagnosis present

## 2023-04-07 DIAGNOSIS — J811 Chronic pulmonary edema: Secondary | ICD-10-CM | POA: Diagnosis not present

## 2023-04-07 DIAGNOSIS — K709 Alcoholic liver disease, unspecified: Secondary | ICD-10-CM | POA: Diagnosis present

## 2023-04-07 DIAGNOSIS — I5020 Unspecified systolic (congestive) heart failure: Secondary | ICD-10-CM | POA: Diagnosis not present

## 2023-04-07 DIAGNOSIS — K219 Gastro-esophageal reflux disease without esophagitis: Secondary | ICD-10-CM | POA: Diagnosis present

## 2023-04-07 DIAGNOSIS — R1312 Dysphagia, oropharyngeal phase: Secondary | ICD-10-CM | POA: Diagnosis present

## 2023-04-07 DIAGNOSIS — R531 Weakness: Secondary | ICD-10-CM | POA: Diagnosis not present

## 2023-04-07 DIAGNOSIS — I952 Hypotension due to drugs: Secondary | ICD-10-CM | POA: Diagnosis not present

## 2023-04-07 DIAGNOSIS — I8501 Esophageal varices with bleeding: Secondary | ICD-10-CM | POA: Diagnosis not present

## 2023-04-07 DIAGNOSIS — E271 Primary adrenocortical insufficiency: Secondary | ICD-10-CM | POA: Diagnosis present

## 2023-04-07 DIAGNOSIS — R109 Unspecified abdominal pain: Secondary | ICD-10-CM | POA: Diagnosis not present

## 2023-04-07 DIAGNOSIS — D6959 Other secondary thrombocytopenia: Secondary | ICD-10-CM | POA: Diagnosis present

## 2023-04-07 DIAGNOSIS — I864 Gastric varices: Secondary | ICD-10-CM | POA: Diagnosis present

## 2023-04-07 DIAGNOSIS — E8771 Transfusion associated circulatory overload: Secondary | ICD-10-CM | POA: Diagnosis not present

## 2023-04-07 DIAGNOSIS — I5032 Chronic diastolic (congestive) heart failure: Secondary | ICD-10-CM | POA: Diagnosis present

## 2023-04-07 DIAGNOSIS — D72829 Elevated white blood cell count, unspecified: Secondary | ICD-10-CM | POA: Diagnosis not present

## 2023-04-07 DIAGNOSIS — R008 Other abnormalities of heart beat: Secondary | ICD-10-CM | POA: Diagnosis not present

## 2023-04-07 DIAGNOSIS — J9691 Respiratory failure, unspecified with hypoxia: Secondary | ICD-10-CM | POA: Diagnosis not present

## 2023-04-07 DIAGNOSIS — K766 Portal hypertension: Secondary | ICD-10-CM | POA: Diagnosis not present

## 2023-04-07 DIAGNOSIS — D62 Acute posthemorrhagic anemia: Secondary | ICD-10-CM | POA: Diagnosis not present

## 2023-04-07 DIAGNOSIS — F1910 Other psychoactive substance abuse, uncomplicated: Secondary | ICD-10-CM | POA: Diagnosis present

## 2023-04-07 DIAGNOSIS — J810 Acute pulmonary edema: Secondary | ICD-10-CM | POA: Diagnosis not present

## 2023-04-07 DIAGNOSIS — F17200 Nicotine dependence, unspecified, uncomplicated: Secondary | ICD-10-CM | POA: Diagnosis not present

## 2023-04-07 DIAGNOSIS — R0902 Hypoxemia: Secondary | ICD-10-CM | POA: Diagnosis not present

## 2023-04-07 DIAGNOSIS — I25119 Atherosclerotic heart disease of native coronary artery with unspecified angina pectoris: Secondary | ICD-10-CM | POA: Diagnosis present

## 2023-04-07 DIAGNOSIS — Z9911 Dependence on respirator [ventilator] status: Secondary | ICD-10-CM | POA: Diagnosis not present

## 2023-04-07 DIAGNOSIS — R079 Chest pain, unspecified: Secondary | ICD-10-CM | POA: Diagnosis not present

## 2023-04-07 DIAGNOSIS — K209 Esophagitis, unspecified without bleeding: Secondary | ICD-10-CM | POA: Diagnosis not present

## 2023-04-07 DIAGNOSIS — J9690 Respiratory failure, unspecified, unspecified whether with hypoxia or hypercapnia: Secondary | ICD-10-CM | POA: Diagnosis not present

## 2023-04-07 DIAGNOSIS — I272 Pulmonary hypertension, unspecified: Secondary | ICD-10-CM | POA: Diagnosis not present

## 2023-04-07 DIAGNOSIS — D649 Anemia, unspecified: Secondary | ICD-10-CM | POA: Diagnosis not present

## 2023-04-07 DIAGNOSIS — K920 Hematemesis: Secondary | ICD-10-CM | POA: Diagnosis not present

## 2023-04-07 DIAGNOSIS — Z6833 Body mass index (BMI) 33.0-33.9, adult: Secondary | ICD-10-CM | POA: Diagnosis not present

## 2023-04-07 DIAGNOSIS — R000 Tachycardia, unspecified: Secondary | ICD-10-CM | POA: Diagnosis not present

## 2023-04-07 DIAGNOSIS — R578 Other shock: Secondary | ICD-10-CM | POA: Diagnosis present

## 2023-04-07 DIAGNOSIS — F1429 Cocaine dependence with unspecified cocaine-induced disorder: Secondary | ICD-10-CM | POA: Diagnosis present

## 2023-04-07 DIAGNOSIS — Z4682 Encounter for fitting and adjustment of non-vascular catheter: Secondary | ICD-10-CM | POA: Diagnosis not present

## 2023-04-07 DIAGNOSIS — T4275XA Adverse effect of unspecified antiepileptic and sedative-hypnotic drugs, initial encounter: Secondary | ICD-10-CM | POA: Diagnosis not present

## 2023-04-07 DIAGNOSIS — F419 Anxiety disorder, unspecified: Secondary | ICD-10-CM | POA: Diagnosis present

## 2023-04-07 DIAGNOSIS — G9341 Metabolic encephalopathy: Secondary | ICD-10-CM | POA: Diagnosis not present

## 2023-04-07 DIAGNOSIS — K2289 Other specified disease of esophagus: Secondary | ICD-10-CM | POA: Diagnosis not present

## 2023-04-07 DIAGNOSIS — F39 Unspecified mood [affective] disorder: Secondary | ICD-10-CM | POA: Diagnosis not present

## 2023-04-07 DIAGNOSIS — I10 Essential (primary) hypertension: Secondary | ICD-10-CM | POA: Diagnosis present

## 2023-04-07 LAB — APTT: aPTT: 34 seconds (ref 24–36)

## 2023-04-07 LAB — CBC WITH DIFFERENTIAL/PLATELET
Abs Immature Granulocytes: 0.05 10*3/uL (ref 0.00–0.07)
Basophils Absolute: 0.1 10*3/uL (ref 0.0–0.1)
Basophils Relative: 1 %
Eosinophils Absolute: 0.1 10*3/uL (ref 0.0–0.5)
Eosinophils Relative: 1 %
HCT: 23.2 % — ABNORMAL LOW (ref 39.0–52.0)
Hemoglobin: 7.1 g/dL — ABNORMAL LOW (ref 13.0–17.0)
Immature Granulocytes: 1 %
Lymphocytes Relative: 23 %
Lymphs Abs: 2.3 10*3/uL (ref 0.7–4.0)
MCH: 28.3 pg (ref 26.0–34.0)
MCHC: 30.6 g/dL (ref 30.0–36.0)
MCV: 92.4 fL (ref 80.0–100.0)
Monocytes Absolute: 1 10*3/uL (ref 0.1–1.0)
Monocytes Relative: 9 %
Neutro Abs: 6.7 10*3/uL (ref 1.7–7.7)
Neutrophils Relative %: 65 %
Platelets: 157 10*3/uL (ref 150–400)
RBC: 2.51 MIL/uL — ABNORMAL LOW (ref 4.22–5.81)
RDW: 17 % — ABNORMAL HIGH (ref 11.5–15.5)
WBC: 10.2 10*3/uL (ref 4.0–10.5)
nRBC: 0 % (ref 0.0–0.2)

## 2023-04-07 LAB — CBC
HCT: 22.1 % — ABNORMAL LOW (ref 39.0–52.0)
Hemoglobin: 7 g/dL — ABNORMAL LOW (ref 13.0–17.0)
MCH: 29.2 pg (ref 26.0–34.0)
MCHC: 31.7 g/dL (ref 30.0–36.0)
MCV: 92.1 fL (ref 80.0–100.0)
Platelets: 89 10*3/uL — ABNORMAL LOW (ref 150–400)
RBC: 2.4 MIL/uL — ABNORMAL LOW (ref 4.22–5.81)
RDW: 16.3 % — ABNORMAL HIGH (ref 11.5–15.5)
WBC: 4.7 10*3/uL (ref 4.0–10.5)
nRBC: 0 % (ref 0.0–0.2)

## 2023-04-07 LAB — URINE DRUG SCREEN, QUALITATIVE (ARMC ONLY)
Amphetamines, Ur Screen: NOT DETECTED
Barbiturates, Ur Screen: NOT DETECTED
Benzodiazepine, Ur Scrn: POSITIVE — AB
Cannabinoid 50 Ng, Ur ~~LOC~~: NOT DETECTED
Cocaine Metabolite,Ur ~~LOC~~: POSITIVE — AB
MDMA (Ecstasy)Ur Screen: NOT DETECTED
Methadone Scn, Ur: NOT DETECTED
Opiate, Ur Screen: NOT DETECTED
Phencyclidine (PCP) Ur S: NOT DETECTED

## 2023-04-07 LAB — COMPREHENSIVE METABOLIC PANEL
ALT: 13 U/L (ref 0–44)
ALT: 19 U/L (ref 0–44)
AST: 44 U/L — ABNORMAL HIGH (ref 15–41)
AST: 47 U/L — ABNORMAL HIGH (ref 15–41)
Albumin: 2.3 g/dL — ABNORMAL LOW (ref 3.5–5.0)
Albumin: 2.3 g/dL — ABNORMAL LOW (ref 3.5–5.0)
Alkaline Phosphatase: 153 U/L — ABNORMAL HIGH (ref 38–126)
Alkaline Phosphatase: 144 U/L — ABNORMAL HIGH (ref 38–126)
Anion gap: 12 (ref 5–15)
Anion gap: 10 (ref 5–15)
BUN: 8 mg/dL (ref 6–20)
BUN: 11 mg/dL (ref 6–20)
CO2: 27 mmol/L (ref 22–32)
CO2: 28 mmol/L (ref 22–32)
Calcium: 7.4 mg/dL — ABNORMAL LOW (ref 8.9–10.3)
Calcium: 7.3 mg/dL — ABNORMAL LOW (ref 8.9–10.3)
Chloride: 102 mmol/L (ref 98–111)
Chloride: 103 mmol/L (ref 98–111)
Creatinine, Ser: 0.74 mg/dL (ref 0.61–1.24)
Creatinine, Ser: 0.73 mg/dL (ref 0.61–1.24)
GFR, Estimated: >60 mL/min (ref >60)
GFR, Estimated: >60 mL/min (ref >60)
Glucose, Bld: 140 mg/dL — ABNORMAL HIGH (ref 70–99)
Glucose, Bld: 152 mg/dL — ABNORMAL HIGH (ref 70–99)
Potassium: 2.3 mmol/L — CL (ref 3.5–5.1)
Potassium: 2.7 mmol/L — CL (ref 3.5–5.1)
Sodium: 141 mmol/L (ref 135–145)
Sodium: 141 mmol/L (ref 135–145)
Total Bilirubin: 1.6 mg/dL — ABNORMAL HIGH (ref 0.3–1.2)
Total Bilirubin: 2.6 mg/dL — ABNORMAL HIGH (ref 0.3–1.2)
Total Protein: 5.6 g/dL — ABNORMAL LOW (ref 6.5–8.1)
Total Protein: 5.6 g/dL — ABNORMAL LOW (ref 6.5–8.1)

## 2023-04-07 LAB — BPAM RBC: ISSUE DATE / TIME: 202404221423

## 2023-04-07 LAB — LACTIC ACID, PLASMA
Lactic Acid, Venous: 5.6 mmol/L (ref 0.5–1.9)
Lactic Acid, Venous: 3.7 mmol/L (ref 0.5–1.9)
Lactic Acid, Venous: 2.3 mmol/L (ref 0.5–1.9)

## 2023-04-07 LAB — MAGNESIUM: Magnesium: 1.2 mg/dL — ABNORMAL LOW (ref 1.7–2.4)

## 2023-04-07 LAB — LIPASE, BLOOD: Lipase: 38 U/L (ref 11–51)

## 2023-04-07 LAB — GLUCOSE, CAPILLARY
Glucose-Capillary: 140 mg/dL — ABNORMAL HIGH (ref 70–99)
Glucose-Capillary: 144 mg/dL — ABNORMAL HIGH (ref 70–99)

## 2023-04-07 LAB — PROTIME-INR
INR: 1.6 — ABNORMAL HIGH (ref 0.8–1.2)
Prothrombin Time: 18.9 seconds — ABNORMAL HIGH (ref 11.4–15.2)

## 2023-04-07 LAB — TYPE AND SCREEN: Unit division: 0

## 2023-04-07 LAB — MRSA NEXT GEN BY PCR, NASAL: MRSA by PCR Next Gen: NOT DETECTED

## 2023-04-07 LAB — ETHANOL: Alcohol, Ethyl (B): 107 mg/dL — ABNORMAL HIGH (ref <10)

## 2023-04-07 MED ORDER — POTASSIUM CHLORIDE 10 MEQ/100ML IV SOLN
10.0000 meq | INTRAVENOUS | Status: AC
  Administered 2023-04-07 (×4): 10 meq via INTRAVENOUS
  Filled 2023-04-07 (×4): qty 100

## 2023-04-07 MED ORDER — LACTATED RINGERS IV SOLN: INTRAVENOUS | Status: DC

## 2023-04-07 MED ORDER — MIDODRINE HCL 5 MG PO TABS
10.0000 mg | ORAL_TABLET | Freq: Three times a day (TID) | ORAL | Status: DC
  Administered 2023-04-08 – 2023-04-13 (×10): 10 mg via ORAL
  Filled 2023-04-07 (×12): qty 2

## 2023-04-07 MED ORDER — POLYETHYLENE GLYCOL 3350 17 G PO PACK: 17.0000 g | PACK | Freq: Every day | ORAL | Status: DC | PRN

## 2023-04-07 MED ORDER — PANTOPRAZOLE 80MG IVPB - SIMPLE MED
80.0000 mg | Freq: Once | INTRAVENOUS | Status: AC
  Administered 2023-04-07: 80 mg via INTRAVENOUS
  Filled 2023-04-07: qty 100

## 2023-04-07 MED ORDER — LACTATED RINGERS IV BOLUS (SEPSIS)
500.0000 mL | Freq: Once | INTRAVENOUS | Status: AC
  Administered 2023-04-07: 500 mL via INTRAVENOUS

## 2023-04-07 MED ORDER — LACTULOSE 10 GM/15ML PO SOLN: 30.0000 g | Freq: Two times a day (BID) | ORAL | Status: DC

## 2023-04-07 MED ORDER — SODIUM CHLORIDE 0.9 % IV SOLN
50.0000 ug/h | INTRAVENOUS | Status: DC
  Administered 2023-04-07 – 2023-04-10 (×6): 50 ug/h via INTRAVENOUS
  Filled 2023-04-07 (×6): qty 1

## 2023-04-07 MED ORDER — HYDROCORTISONE SOD SUC (PF) 100 MG IJ SOLR
100.0000 mg | Freq: Once | INTRAMUSCULAR | Status: AC
  Administered 2023-04-07: 100 mg via INTRAVENOUS
  Filled 2023-04-07: qty 2

## 2023-04-07 MED ORDER — FENTANYL CITRATE PF 50 MCG/ML IJ SOSY
50.0000 ug | PREFILLED_SYRINGE | Freq: Once | INTRAMUSCULAR | Status: AC
  Administered 2023-04-07: 50 ug via INTRAVENOUS
  Filled 2023-04-07: qty 1

## 2023-04-07 MED ORDER — SODIUM CHLORIDE 0.9 % IV SOLN
50.0000 ug/h | INTRAVENOUS | Status: DC
  Administered 2023-04-07: 50 ug/h via INTRAVENOUS
  Filled 2023-04-07 (×2): qty 1

## 2023-04-07 MED ORDER — SODIUM CHLORIDE 0.9 % IV SOLN: 2.0000 g | INTRAVENOUS | Status: DC

## 2023-04-07 MED ORDER — SODIUM CHLORIDE 0.9 % IV SOLN
1.0000 g | Freq: Once | INTRAVENOUS | Status: AC
  Administered 2023-04-07: 1 g via INTRAVENOUS
  Filled 2023-04-07: qty 10

## 2023-04-07 MED ORDER — OCTREOTIDE LOAD VIA INFUSION
50.0000 ug | Freq: Once | INTRAVENOUS | Status: AC
  Administered 2023-04-07: 50 ug via INTRAVENOUS
  Filled 2023-04-07: qty 25

## 2023-04-07 MED ORDER — THIAMINE MONONITRATE 100 MG PO TABS
100.0000 mg | ORAL_TABLET | Freq: Every day | ORAL | Status: DC
  Administered 2023-04-07 – 2023-04-13 (×5): 100 mg via ORAL
  Filled 2023-04-07 (×5): qty 1

## 2023-04-07 MED ORDER — PANTOPRAZOLE SODIUM 40 MG IV SOLR
40.0000 mg | Freq: Two times a day (BID) | INTRAVENOUS | Status: DC
  Administered 2023-04-11 – 2023-04-25 (×29): 40 mg via INTRAVENOUS
  Filled 2023-04-07 (×27): qty 10

## 2023-04-07 MED ORDER — FOLIC ACID 1 MG PO TABS
1.0000 mg | ORAL_TABLET | Freq: Every day | ORAL | Status: DC
  Administered 2023-04-07 – 2023-04-08 (×2): 1 mg via ORAL
  Filled 2023-04-07 (×2): qty 1

## 2023-04-07 MED ORDER — HYDROCORTISONE SOD SUC (PF) 100 MG IJ SOLR
100.0000 mg | Freq: Two times a day (BID) | INTRAMUSCULAR | Status: DC
  Administered 2023-04-07 – 2023-04-09 (×5): 100 mg via INTRAVENOUS
  Filled 2023-04-07 (×5): qty 2

## 2023-04-07 MED ORDER — PANTOPRAZOLE INFUSION (NEW) - SIMPLE MED
8.0000 mg/h | INTRAVENOUS | Status: AC
  Administered 2023-04-07 – 2023-04-10 (×6): 8 mg/h via INTRAVENOUS
  Filled 2023-04-07 (×7): qty 100

## 2023-04-07 MED ORDER — MAGNESIUM SULFATE 2 GM/50ML IV SOLN
2.0000 g | Freq: Once | INTRAVENOUS | Status: AC
  Administered 2023-04-07: 2 g via INTRAVENOUS
  Filled 2023-04-07: qty 50

## 2023-04-07 MED ORDER — LORAZEPAM 1 MG PO TABS
1.0000 mg | ORAL_TABLET | ORAL | Status: AC | PRN
  Administered 2023-04-07: 2 mg via ORAL
  Administered 2023-04-10: 1 mg via ORAL
  Filled 2023-04-07: qty 2
  Filled 2023-04-07: qty 4

## 2023-04-07 MED ORDER — SODIUM CHLORIDE 0.9 % IV SOLN
10.0000 mL/h | Freq: Once | INTRAVENOUS | Status: AC
  Administered 2023-04-07: 10 mL/h via INTRAVENOUS

## 2023-04-07 MED ORDER — LORAZEPAM 2 MG/ML IJ SOLN
1.0000 mg | INTRAMUSCULAR | Status: AC | PRN
  Administered 2023-04-10: 2 mg via INTRAVENOUS
  Filled 2023-04-07: qty 1

## 2023-04-07 MED ORDER — MORPHINE SULFATE (PF) 2 MG/ML IV SOLN
2.0000 mg | Freq: Once | INTRAVENOUS | Status: AC
  Administered 2023-04-07: 2 mg via INTRAVENOUS
  Filled 2023-04-07: qty 1

## 2023-04-07 MED ORDER — POTASSIUM CHLORIDE 10 MEQ/50ML IV SOLN
10.0000 meq | INTRAVENOUS | Status: DC
  Administered 2023-04-07 – 2023-04-08 (×4): 10 meq via INTRAVENOUS
  Filled 2023-04-07 (×6): qty 50

## 2023-04-07 MED ORDER — PANTOPRAZOLE 80MG IVPB - SIMPLE MED: 80.0000 mg | Freq: Once | INTRAVENOUS | Status: DC

## 2023-04-07 MED ORDER — CALCIUM GLUCONATE-NACL 1-0.675 GM/50ML-% IV SOLN
1.0000 g | Freq: Once | INTRAVENOUS | Status: AC
  Administered 2023-04-07: 1000 mg via INTRAVENOUS
  Filled 2023-04-07: qty 50

## 2023-04-07 MED ORDER — POTASSIUM CHLORIDE 20 MEQ PO PACK
40.0000 meq | PACK | Freq: Once | ORAL | Status: AC
  Administered 2023-04-07: 40 meq via ORAL
  Filled 2023-04-07: qty 2

## 2023-04-07 MED ORDER — SODIUM CHLORIDE 0.9 % IV BOLUS
1000.0000 mL | Freq: Once | INTRAVENOUS | Status: AC
  Administered 2023-04-07: 1000 mL via INTRAVENOUS

## 2023-04-07 MED ORDER — THIAMINE HCL 100 MG/ML IJ SOLN
100.0000 mg | Freq: Every day | INTRAMUSCULAR | Status: DC
  Administered 2023-04-09 – 2023-04-10 (×2): 100 mg via INTRAVENOUS
  Filled 2023-04-07 (×4): qty 2

## 2023-04-07 MED ORDER — DOCUSATE SODIUM 100 MG PO CAPS: 100.0000 mg | ORAL_CAPSULE | Freq: Two times a day (BID) | ORAL | Status: DC | PRN

## 2023-04-07 MED ORDER — ADULT MULTIVITAMIN W/MINERALS CH
1.0000 | ORAL_TABLET | Freq: Every day | ORAL | Status: DC
  Administered 2023-04-07 – 2023-04-13 (×5): 1 via ORAL
  Filled 2023-04-07 (×5): qty 1

## 2023-04-07 NOTE — Assessment & Plan Note (Signed)
Mg 1.2 on admission.  IV replacement started in the ED. Monitor Mg level & replace PRN.

## 2023-04-07 NOTE — ED Triage Notes (Addendum)
Pt arrives from home via EMS w/ c/o hematemesis and abd pain. EMS was called by neighbors, pt was ambulatory on scene. EMS reports about 1-2L blood loss throughout the house on the floor. 88/54. Pt is alert, does appear pale. Pt reports hx of same last month. Hx varices with bands, last drink was last night, last cocaine use was last night.  4 zofran given, 500 NS given

## 2023-04-07 NOTE — ED Notes (Addendum)
RN advised MD that pt has poor vascular access and that a central line is needed. RN set up for provider. Delay in [facility] and abx at this time. MD aware

## 2023-04-07 NOTE — Assessment & Plan Note (Signed)
Body mass index is 33.45 kg/m. Complicates overall care and prognosis.  Recommend lifestyle modifications including physical activity and diet for weight loss and overall long-term health.

## 2023-04-07 NOTE — Assessment & Plan Note (Signed)
K 2.3 on admission, started on IV replacement in ED. Monitor BMP, replace K PRN

## 2023-04-07 NOTE — ED Notes (Signed)
Lab called for blood culture collection

## 2023-04-07 NOTE — Assessment & Plan Note (Signed)
Holding PO meds

## 2023-04-07 NOTE — Assessment & Plan Note (Signed)
Hematemesis - likely variceal bleeding, given recent history of same s/p banding on 03/01/23 with ongoing alcohol abuse. --GI consulted --Recommend transfer for TIPS --Octreotide gtt, PPI gtt, IV Rocephin

## 2023-04-07 NOTE — ED Notes (Signed)
Provider at bedside to place central line at this time.

## 2023-04-07 NOTE — Progress Notes (Addendum)
eLink Physician-Brief Progress Note Patient Name: EZRAEL SAM DOB: 07/26/1971 MRN: [id number]   Date of Service  04/07/2023  HPI/Events of Note  52 year old male with a history of adrenal insufficiency and alcohol use and her erosive esophagitis back in March 2024 that had recent upper GI bleed where he was recommended a TIPS procedure but refused.  He was banded at that time and eventually discharged.  He presents to the hospital with hematemesis, melena and hypotension.  He was transferred to [facility] for further evaluation.  He initially presented with hypotension in the 80s over 40s, but this improved with resuscitation.  Initial labs show severely low potassium of 2.3, hemoglobin 7.1, INR 1.6.  UDS positive for barbiturates and benzos and blood cultures have been obtained.  Chest radiograph unremarkable.  eICU Interventions  Ground team evaluation pending.  For now, add on pantoprazole, octreotide infusions, ceftriaxone prophylaxis  Repeat CBC and CMP now.  Send type and screen with these labs.  Patient has a left-sided IJ CVC and 220 and 22-gauge Ivs  Maintain NPO.  Okay to moisten mouth with ice chips/swabs.  Requesting pain meds  Will defer stress dose steroids versus Hydrocort to ground team.   2216 - Hg 7, anticipate transfusion with next check  Intervention Category Evaluation Type: New Patient Evaluation  Malyah Ohlrich 04/07/2023, 8:12 PM

## 2023-04-07 NOTE — Consult Note (Signed)
Ethan Wells , MD [address], [address], [place], [place], [address] [address], [place], [place], [address] Phone: [phone]  Fax: [phone]  Consultation  Referring Provider:    ER  Primary Care Physician:  [name], NP Primary Gastroenterologist:  Dr. Allegra Lai       Reason for Consultation:     GI bleed  Date of Admission:  04/07/2023 Date of Consultation:  04/07/2023         HPI:   Ethan Wells is a 52 y.o. male with a history of alcohol abuse erosive esophagitis was admitted back in March 2024 with an upper GI bleed.  During last admission recommended he undergo a TIPS procedure but the patient refused history of cocaine use.  He underwent an upper endoscopy on 03/01/2023 and 2 bands were placed in his esophagus and complete eradication of esophageal varices was noted.  He was advised to follow-up for repeat banding but did not follow-up. Today he presents the hospital with hematemesis melena large quantity he was hypotensive on arrival with lactic acidosis and I was consulted to see him as an urgent consult.  The patient has ongoing alcohol and cocaine use.  On admission hemoglobin was 7.1 g at discharge previously was 8.4 g INR 1.6 g.  He admits that he drinks alcohol and uses cocaine and he had multiple bouts of hematemesis before coming in. Past Medical History:  Diagnosis Date   CAD (coronary artery disease)    a. 01/2011 Anterior STEMI/Cath/PCI: LM nl, LAD 100d (3.5x84mm Vision BMS placed), LCX 16m, RI 50, RCA min irregs, EF 40% w/ apical, inferoapical HK; b. 10/2022 NSTSEMI/Cath: LM nl, LAD 5p/m, RI 85, LCX 166m, OM1 mild dzs, OM3 100 fills via L->L collats from dLAD, RCA 80p, 146m, RPDA fills via collats from LAD. EF 45-50%-->Med Rx.   Cardiac arrest - ventricular fibrillation    a. In setting of STEMI 01/2011   Chronic pain    Cocaine abuse    Depression with anxiety    ETOH abuse    a. 6-12 beers/day   GERD (gastroesophageal reflux  disease)    GI bleed    Hemorrhoids    Hyperlipidemia    Hypertension    Ischemic cardiomyopathy    a. 06/2011 Echo: EF 45-50%, No rwma; b. 10/2022 Echo: EF 55-60%, no rwma, nl RV fxn.   Marijuana abuse    Migraines    Tobacco abuse    a. 1/2 ppd x 26 yrs    Past Surgical History:  Procedure Laterality Date   COLONOSCOPY  08/09/2021   Procedure: COLONOSCOPY;  Surgeon: Midge Minium, MD;  Location: [facility] ENDOSCOPY;  Service: Endoscopy;;   CORONARY ANGIOPLASTY WITH STENT PLACEMENT     ESOPHAGOGASTRODUODENOSCOPY N/A 05/30/2022   Procedure: ESOPHAGOGASTRODUODENOSCOPY (EGD);  Surgeon: Toney Reil, MD;  Location: [facility] ENDOSCOPY;  Service: Gastroenterology;  Laterality: N/A;   ESOPHAGOGASTRODUODENOSCOPY (EGD) WITH PROPOFOL N/A 03/01/2021   Procedure: ESOPHAGOGASTRODUODENOSCOPY (EGD) WITH PROPOFOL;  Surgeon: Toney Reil, MD;  Location: [facility] SURGERY CNTR;  Service: Endoscopy;  Laterality: N/A;   ESOPHAGOGASTRODUODENOSCOPY (EGD) WITH PROPOFOL N/A 08/09/2021   Procedure: ESOPHAGOGASTRODUODENOSCOPY (EGD) WITH PROPOFOL;  Surgeon: Midge Minium, MD;  Location: [facility] ENDOSCOPY;  Service: Endoscopy;  Laterality: N/A;   ESOPHAGOGASTRODUODENOSCOPY (EGD) WITH PROPOFOL N/A 03/01/2023   Procedure: ESOPHAGOGASTRODUODENOSCOPY (EGD) WITH PROPOFOL;  Surgeon: Jaynie Collins, DO;  Location: [facility] ENDOSCOPY;  Service: Gastroenterology;  Laterality: N/A;   EVALUATION UNDER ANESTHESIA WITH HEMORRHOIDECTOMY N/A 10/31/2022  Procedure: EXAM UNDER ANESTHESIA WITH SUTURE LIGATION OF TWO BLEEDING PEDICLES;  Surgeon: Henrene Dodge, MD;  Location: [facility] ORS;  Service: General;  Laterality: N/A;   FLEXIBLE SIGMOIDOSCOPY N/A 10/02/2022   Procedure: FLEXIBLE SIGMOIDOSCOPY;  Surgeon: Meryl Dare, MD;  Location: [facility] ENDOSCOPY;  Service: Gastroenterology;  Laterality: N/A;   LEFT HEART CATH AND CORONARY ANGIOGRAPHY N/A 11/04/2022   Procedure: LEFT HEART CATH AND CORONARY ANGIOGRAPHY;  Surgeon: Iran Ouch, MD;  Location: [facility] INVASIVE CV LAB;  Service: Cardiovascular;  Laterality: N/A;   VISCERAL ANGIOGRAPHY N/A 10/24/2022   Procedure: VISCERAL ANGIOGRAPHY;  Surgeon: Annice Needy, MD;  Location: [facility] INVASIVE CV LAB;  Service: Cardiovascular;  Laterality: N/A;    Prior to Admission medications   Medication Sig Start Date End Date Taking? Authorizing Provider  acetaminophen (TYLENOL) 500 MG tablet Take 1 tablet (500 mg total) by mouth every 8 (eight) hours as needed for mild pain, fever or headache. 03/05/23  Yes [name], MD  gabapentin (NEURONTIN) 300 MG capsule Take 1 capsule (300 mg total) by mouth 3 (three) times daily. 03/05/23 06/03/23 Yes [name], MD  hydrocortisone (CORTEF) 10 MG tablet Take 1 tablet (10 mg total) by mouth daily. 03/05/23 03/04/24 Yes [name], MD  hydrocortisone (CORTEF) 5 MG tablet Take 1 tablet (5 mg total) by mouth daily after lunch. 03/05/23 03/04/24 Yes [name], MD  ibuprofen (ADVIL) 800 MG tablet Take 800 mg by mouth every 8 (eight) hours as needed. 12/21/22  Yes [provider]  lactulose (CHRONULAC) 10 GM/15ML solution Take 30 mLs (20 g total) by mouth 2 (two) times daily. Titrate to 1-2 BM per day 03/05/23  Yes [name], MD  methocarbamol (ROBAXIN) 750 MG tablet Take 1 tablet (750 mg total) by mouth daily as needed for muscle spasms. 03/05/23  Yes [name], MD  midodrine (PROAMATINE) 10 MG tablet Take 1 tablet (10 mg total) by mouth 3 (three) times daily with meals. Skip the dose if Systolic BP greater than 110 mmHg and or HR greater than 100 03/05/23 06/03/23 Yes [name], MD  Multiple Vitamin (MULTIVITAMIN WITH MINERALS) TABS tablet Take 1 tablet by mouth daily. 03/06/23 03/05/24 Yes [name], MD  nitroGLYCERIN (NITROSTAT) 0.4 MG SL tablet Place 1 tablet (0.4 mg total) under the tongue every 5 (five) minutes as needed for chest pain. 03/05/23  Yes [name], MD  pantoprazole (PROTONIX) 40 MG tablet Take 1 tablet (40 mg  total) by mouth 2 (two) times daily. 03/05/23 03/04/24 Yes [name], MD  QUEtiapine (SEROQUEL) 25 MG tablet Take 1 tablet (25 mg total) by mouth at bedtime. 03/05/23 06/03/23 Yes [name], MD  rosuvastatin (CRESTOR) 40 MG tablet Take 1 tablet (40 mg total) by mouth daily. 03/05/23 03/04/24 Yes [name], MD  spironolactone (ALDACTONE) 25 MG tablet Take 0.5 tablets (12.5 mg total) by mouth daily. 03/06/23 06/04/23 Yes [name], MD  torsemide 40 MG TABS Take 40 mg by mouth 2 (two) times daily for 14 days, THEN 40 mg daily. Patient not taking: Reported on 04/07/2023 03/05/23 03/18/24  [name], MD    Family History  Problem Relation Age of Onset   Coronary artery disease Mother        alive   Other Other        2 sisters alive and well   Alcohol abuse Father        died @ 64   Other Paternal Grandmother  PACER     Social History   Tobacco Use   Smoking status: Every Day    Packs/day: 0.50    Years: 41.00    Additional pack years: 0.00    Total pack years: 20.50    Types: Cigarettes   Smokeless tobacco: Never   Tobacco comments:    started age 66  Vaping Use   Vaping Use: Never used  Substance Use Topics   Alcohol use: Yes    Alcohol/week: 12.0 standard drinks of alcohol    Types: 12 Cans of beer per week    Comment: at least a 6 pack of beer daily and often a 12 pack   Drug use: Yes    Types: Cocaine, Marijuana    Allergies as of 04/07/2023 - Review Complete 04/07/2023  Allergen Reaction Noted   Penicillins Hives 02/26/2011   Other Rash 02/22/2021    Review of Systems:    All systems reviewed and negative except where noted in HPI.   Physical Exam:  Vital signs in last 24 hours: Temp:  [97.7 F (36.5 C)-98.1 F (36.7 C)] 97.8 F (36.6 C) (04/22 1445) Pulse Rate:  [92-100] 99 (04/22 1445) Resp:  [17-25] 19 (04/22 1445) BP: (86-108)/(41-68) 108/56 (04/22 1445) SpO2:  [98 %-100 %] 100 % (04/22 1445) Weight:  [91.2 kg] 91.2 kg (04/22 1023)    General:   Appears comfortable not in distress Head:  Normocephalic and atraumatic. Eyes:   No icterus.   Conjunctiva pink. PERRLA. Ears:  Normal auditory acuity. Neck:  Supple; no masses or thyroidomegaly Lungs: Respirations even and unlabored. Lungs clear to auscultation bilaterally.   No wheezes, crackles, or rhonchi.  Heart:  Regular rate and rhythm;  Without murmur, clicks, rubs or gallops Abdomen:  Soft, nondistended, nontender. Normal bowel sounds. No appreciable masses or hepatomegaly.  No rebound or guarding.  Neurologic:  Alert and oriented x3;  grossly normal neurologically. Psych:  Alert and cooperative. Normal affect.  LAB RESULTS: Recent Labs    04/07/23 1026  WBC 10.2  HGB 7.1*  HCT 23.2*  PLT 157   BMET Recent Labs    04/07/23 1026  NA 141  K 2.3*  CL 102  CO2 27  GLUCOSE 140*  BUN 8  CREATININE 0.74  CALCIUM 7.4*   LFT Recent Labs    04/07/23 1026  PROT 5.6*  ALBUMIN 2.3*  AST 44*  ALT 13  ALKPHOS 153*  BILITOT 1.6*   PT/INR Recent Labs    04/07/23 1026  LABPROT 18.9*  INR 1.6*    STUDIES: DG Chest Port 1 View  Result Date: 04/07/2023 CLINICAL DATA:  Hematemesis and abdominal pain EXAM: PORTABLE CHEST 1 VIEW COMPARISON:  01/18/2023 FINDINGS: Unchanged cardiomegaly. No new focal pulmonary opacity. Chronic coarsened interstitial markings without overt pulmonary edema. No pleural effusion or pneumothorax. No acute osseous abnormality. IMPRESSION: Unchanged cardiomegaly. No acute cardiopulmonary process. Electronically Signed   By: Wiliam Ke M.D.   On: 04/07/2023 11:27      Impression / Plan:   KAIDIN BOEHLE is a 53 y.o. y/o male with history of possible alcoholic liver cirrhosis underwent banding of his esophageal varices in March 2024.  At that point of time plan was for TIPS.  Patient refused procedure . He has ongoing alcohol and cocaine use.  He was suggested to follow-up subsequently for repeat variceal banding but did not do  so.  Presents to the hospital with a significant upper GI bleed with hematemesis and melena associated  hypotension and lactic acidosis.  Very likely has had bleeding from esophageal varices.  With a history of noncompliance to banding he would best be benefited from TIPS especially at the present scenario that he has cocaine in his urine which would be related to contraindication for anesthesia and a severe GI bleed.  I have discussed with emergency room MD as well as admitting physician and he is in the process of being transferred to [facility].  In the interim suggest IV octreotide IV antibiotics.  Monitor CBC and transfuse as needed.  Thank you for involving me in the care of this patient.      LOS: 0 days   Ethan Mood, MD  04/07/2023, 3:17 PM

## 2023-04-07 NOTE — Assessment & Plan Note (Signed)
Holding PO meds  Currently hypotensive from variceal bleeding / hypovolemia

## 2023-04-07 NOTE — ED Provider Notes (Signed)
[facility] Provider Note    Event Date/Time   First MD Initiated Contact with Patient 04/07/23 1013     (approximate)   History   Hematemesis and Abdominal Pain   HPI  Ethan Wells is a 52 y.o. male past medical history significant for polysubstance abuse, alcohol use, cocaine abuse, recurrent GI bleed with known variceal bleed, CAD, CHF, adrenal insufficiency, who presents to the emergency department with a GI bleed.  Patient has had multiple recent hospitalizations and was just discharged from the hospital approximately 1 month ago following significant upper GI bleed.  Patient was hospitalized for at least 1 week, was banded with gastroenterology.  Recommended TIPS procedure at that time however patient declined and wanted to follow-up in 2 months for reevaluation.  Patient received multiple units of PRBC, IV Rocephin, IV Protonix and IV octreotide at that time.  Last drink of alcohol was last night.  States that he drinks daily but he is uncertain of how much.  Last use of cocaine was yesterday.  Denies any blood thinners.  Patient is full code.     Physical Exam   Triage Vital Signs: ED Triage Vitals  Enc Vitals Group     BP      Pulse      Resp      Temp      Temp src      SpO2      Weight      Height      Head Circumference      Peak Flow      Pain Score      Pain Loc      Pain Edu?      Excl. in GC?     Most recent vital signs: Vitals:   04/07/23 1030 04/07/23 1130  BP: (!) 86/41 105/68  Pulse: 99 99  Resp: (!) 25 17  Temp:    SpO2: 99% 100%    Physical Exam Constitutional:      Appearance: He is well-developed. He is ill-appearing.     Comments: Bright red blood to his legs in an emesis bag  HENT:     Head: Atraumatic.  Eyes:     Conjunctiva/sclera: Conjunctivae normal.  Cardiovascular:     Rate and Rhythm: Regular rhythm.  Pulmonary:     Effort: No respiratory distress.  Abdominal:     General: Abdomen is flat.      Tenderness: There is generalized abdominal tenderness.  Musculoskeletal:     Cervical back: Normal range of motion.  Skin:    General: Skin is warm.     Capillary Refill: Capillary refill takes less than 2 seconds.  Neurological:     General: No focal deficit present.     Mental Status: He is alert and oriented to person, place, and time. Mental status is at baseline.     IMPRESSION / MDM / ASSESSMENT AND PLAN / ED COURSE  I reviewed the triage vital signs and the nursing notes.  On chart review patient had history of variceal bleed that required banding with gastroenterology, recommended TIPS procedure.  Patient had a recent hospitalization and was discharged 03/05/2023 after he was hospitalized for more than a week with IV Rocephin, Protonix bolus and infusion, octreotide drip and Solu-Cortef for adrenal insufficiency.  Differential diagnosis most concerning for upper GI bleed, likely variceal bleed given his ongoing alcohol use disorder and recent banding.   EKG  I, Corena Herter, the attending  physician, personally viewed and interpreted this ECG.   Rate: Normal  Rhythm: Normal sinus  Axis: Normal  Intervals: QTc 496  ST&T Change: Nonspecific ST changes Sinus tachycardia while on cardiac telemetry.  RADIOLOGY I independently reviewed imaging, my interpretation of imaging: Chest x-ray with cardiomegaly.  LABS (all labs ordered are listed, but only abnormal results are displayed) Labs interpreted as -    Labs Reviewed  LACTIC ACID, PLASMA - Abnormal; Notable for the following components:      Result Value   Lactic Acid, Venous 5.6 (*)    All other components within normal limits  COMPREHENSIVE METABOLIC PANEL - Abnormal; Notable for the following components:   Potassium 2.3 (*)    Glucose, Bld 140 (*)    Calcium 7.4 (*)    Total Protein 5.6 (*)    Albumin 2.3 (*)    AST 44 (*)    Alkaline Phosphatase 153 (*)    Total Bilirubin 1.6 (*)    All other components  within normal limits  CBC WITH DIFFERENTIAL/PLATELET - Abnormal; Notable for the following components:   RBC 2.51 (*)    Hemoglobin 7.1 (*)    HCT 23.2 (*)    RDW 17.0 (*)    All other components within normal limits  PROTIME-INR - Abnormal; Notable for the following components:   Prothrombin Time 18.9 (*)    INR 1.6 (*)    All other components within normal limits  ETHANOL - Abnormal; Notable for the following components:   Alcohol, Ethyl (B) 107 (*)    All other components within normal limits  MAGNESIUM - Abnormal; Notable for the following components:   Magnesium 1.2 (*)    All other components within normal limits  CULTURE, BLOOD (ROUTINE X 2)  CULTURE, BLOOD (ROUTINE X 2)  APTT  LIPASE, BLOOD  LACTIC ACID, PLASMA  URINE DRUG SCREEN, QUALITATIVE ([facility] ONLY)  TYPE AND SCREEN  PREPARE RBC (CROSSMATCH)     MDM  On arrival to the emergency department concerning for significant upper GI bleed.  Patient was typed and screened.  Blood cultures obtained.  Will give prophylactic Rocephin given concern for SBP given his significant upper GI bleed.  Started on IV Protonix, octreotide and given stress dose of IV Solu-Cortef given his low blood pressure.  Given a 500 bolus.  Patient had an elevated lactic acid of 5, clinical picture is consistent with GI bleed, do not believe that it is consistent with sepsis.  Felt that 30 cc/kg of IV fluids may cause hemodilution and would be detrimental to the patient, given 500 bolus and will reevaluate.  Difficult IV stick, multiple failed attempts of IVs.  Delayed of getting blood cultures and IV antibiotics.  Central line placed for IV access.  Initially admitted to hospitalist service after discussion with gastroenterology however given that the patient has failed a banding procedure and will likely need a TIPS procedure which is currently not able to be done at this hospital recommended transfer to another hospital.  Discussed the patient's case with  Dr. Katrinka Blazing, accepted in transfer to the ICU.      PROCEDURES:  Critical Care performed: yes  .Critical Care  Performed by: Corena Herter, MD Authorized by: Corena Herter, MD   Critical care provider statement:    Critical care time (minutes):  80   Critical care time was exclusive of:  Separately billable procedures and treating other patients   Critical care was necessary to treat or prevent imminent or life-threatening  deterioration of the following conditions:  Circulatory failure   Critical care was time spent personally by me on the following activities:  Development of treatment plan with patient or surrogate, discussions with consultants, evaluation of patient's response to treatment, examination of patient, ordering and review of laboratory studies, ordering and review of radiographic studies, ordering and performing treatments and interventions, pulse oximetry, re-evaluation of patient's condition and review of old charts .Central Line  Date/Time: 04/07/2023 1:52 PM  Performed by: Corena Herter, MD Authorized by: Corena Herter, MD   Consent:    Consent obtained:  Verbal   Consent given by:  Patient   Risks, benefits, and alternatives were discussed: yes     Risks discussed:  Nerve damage, infection and bleeding   Alternatives discussed:  Delayed treatment Universal protocol:    Procedure explained and questions answered to patient or proxy's satisfaction: yes     Relevant documents present and verified: yes     Required blood products, implants, devices, and special equipment available: yes     Patient identity confirmed:  Verbally with patient and arm band Pre-procedure details:    Indication(s): central venous access and insufficient peripheral access     Hand hygiene: Hand hygiene performed prior to insertion     Sterile barrier technique: All elements of maximal sterile technique followed     Skin preparation:  Alcohol and chlorhexidine   Skin preparation agent:  Skin preparation agent completely dried prior to procedure   Sedation:    Sedation type:  None Anesthesia:    Anesthesia method:  Local infiltration   Local anesthetic:  Lidocaine 1% w/o epi Procedure details:    Location:  L internal jugular   Patient position:  Trendelenburg   Procedural supplies:  Triple lumen   Catheter size:  7 Fr   Landmarks identified: yes     Ultrasound guidance: yes     Ultrasound guidance timing: real time     Sterile ultrasound techniques: Sterile gel and sterile probe covers were used     Number of attempts:  1   Successful placement: yes   Post-procedure details:    Post-procedure:  Dressing applied and line sutured   Assessment:  Blood return through all ports, free fluid flow and placement verified by x-ray   Procedure completion:  Tolerated well, no immediate complications   Patient's presentation is most consistent with acute presentation with potential threat to life or bodily function.   MEDICATIONS ORDERED IN ED: Medications  lactated ringers infusion ( Intravenous Not Given 04/07/23 1212)  cefTRIAXone (ROCEPHIN) 1 g in sodium chloride 0.9 % 100 mL IVPB (has no administration in time range)  octreotide (SANDOSTATIN) 2 mcg/mL load via infusion 50 mcg (50 mcg Intravenous Bolus from Bag 04/07/23 1235)    And  octreotide (SANDOSTATIN) 500 mcg in sodium chloride 0.9 % 250 mL (2 mcg/mL) infusion (50 mcg/hr Intravenous New Bag/Given 04/07/23 1233)  hydrocortisone sodium succinate (SOLU-CORTEF) 100 MG injection 100 mg (has no administration in time range)  sodium chloride 0.9 % bolus 1,000 mL (has no administration in time range)  potassium chloride 10 mEq in 100 mL IVPB (has no administration in time range)  magnesium sulfate IVPB 2 g 50 mL (has no administration in time range)  lactated ringers bolus 500 mL (0 mLs Intravenous Stopped 04/07/23 1157)  pantoprazole (PROTONIX) 80 mg /NS 100 mL IVPB (0 mg Intravenous Stopped 04/07/23 1157)  0.9 %  sodium  chloride infusion (10 mL/hr Intravenous New Bag/Given 04/07/23 1353)  FINAL CLINICAL IMPRESSION(S) / ED DIAGNOSES   Final diagnoses:  Upper GI bleed     Rx / DC Orders   ED Discharge Orders     None        Note:  This document was prepared using Dragon voice recognition software and may include unintentional dictation errors.   Corena Herter, MD 04/07/23 1353

## 2023-04-07 NOTE — ED Notes (Signed)
Pt informing this RN he has bed bugs. Charge RN notified, sign placed on door. Receiving RN notified.

## 2023-04-07 NOTE — H&P (Addendum)
NAME:  Ethan Wells, MRN:  [id number], DOB:  Jun 27, 1971, LOS: 0 ADMISSION DATE:  (Not on file), CONSULTATION DATE:  04/07/2023 REFERRING MD:  Dr. Arnoldo Morale, CHIEF COMPLAINT:  Upper GI bleed    History of Present Illness:  Ethan Wells is a 52 yo male with a PMH significant for recurrent GI bleed with known variceal bleed, ischemic cardiomyopathy, EtOH abuse, polysubstance abuse, HTN, HLD, depression, and anxiety who presented to [facility] ED 4/22 for acute GI bleed. Of note patient was seen within this health sytem 1 month ago for significant GI bleed requiring banding per GI with TIPS procedure recommended but patient declined. Patient reports last drink and cocaine use was day prior to admission.  On ED arrival patient was seen hypotensive and mildly tachycardic. Lab work significant for K 2.3, glucoses 140, Mg 1.2, alkaline phos 153, albumin 2.3, AST 44, total protein 5.6, total bili 1.6, lactic 5.6, hgb 7.1, and hct 23.2. Given active bleed with compromised hemodynamics, PCCM consulted for further management and admission.   Pertinent  Medical History  Recurrent GI bleed with known variceal bleed, ischemic cardiomyopathy, EtOH abuse, polysubstance abuse, HTN, HLD, depression, and anxiety   Significant Hospital Events: Including procedures, antibiotic start and stop dates in addition to other pertinent events   4/22 admitted with acute GI bleed   Interim History / Subjective:  Asking for ice chips. BP improved. No further bleeding since this AM.  Objective   There were no vitals taken for this visit.       No intake or output data in the 24 hours ending 04/07/23 1513 There were no vitals filed for this visit.  Examination: General: Adult male, resting in bed, in NAD. Neuro: A&O x 3, no deficits. HEENT: [place]/AT. Sclerae anicteric. EOMI. Cardiovascular: RRR, no M/R/G.  Lungs: Respirations even and unlabored.  CTA bilaterally, No W/R/R. Abdomen: BS x 4, soft, NT/ND.   Musculoskeletal: No gross deformities, no edema.  Skin: Intact, warm, no rashes.  Assessment & Plan:   Hemorrhagic shock in the setting of Upper GI bleed with hematemesis and known varices  -Last upper endoscopy 03/01/23 "which showed 3 columns of esophageal varices (one with red wale sign) s/p EVL x2. No bleeding after procedure. Large amount of food was seen within gastric lumen." TIPS was recommended but pt declined. ABLA - 2/2 above P: Follow H/H, maintain hgb > 7 Continue home Midodrine Stress steroids in lieu of home Cortef (  daily) GI consulted, day team to please follow up with them if not yet seen by then (no urgent need at thsi time) NPO  Maintain 2 large bore PIVs Continue Protonix and Octreotide drip  Hold any further anticoagulation or NSAIDs   Hx cirrhosis 2/2 EtOH P: Continue home lactulose Empiric CTX   Hypokalemia Hypomagnesemia   P: Supplement K and Mg Trend Bmet   Hx Systolic congestive heart failure (EF 50-55% with WMA 02/04/23), HoTN CAD, HTN, HLD P: Continuous telemetry  Strict intake and output Continue home Midodrine Stress steroids in lieu of home Cortef (  daily) Hold home Rosuvastatin, Spironolactone, Torsemide  Hx adrenal insufficiency  P: Stress steroids in lieu of home Cortef (  daily)   Depression and anxiety  Hx EtOH abuse, Cocaine and Marijuana abuse P: CIWA protocol  Consider phenobarbital taper, hold off for now  Seizure precautions  Monitor for withdrawal  Hold home Gabapentin, Methocarbamol, Quetiapine Cessation counseling when able     Best Practice (right click and "Reselect all SmartList Selections" daily)  Diet/type: NPO DVT prophylaxis: SCD GI prophylaxis: PPI Lines: Central line Foley:  N/A Code Status:  full code Last date of multidisciplinary goals of care discussion: None yet.  Labs   CBC: Recent Labs  Lab 04/07/23 1026  WBC 10.2  NEUTROABS 6.7  HGB 7.1*  HCT 23.2*  MCV 92.4  PLT 157     Basic Metabolic Panel: Recent Labs  Lab 04/07/23 1026  NA 141  K 2.3*  CL 102  CO2 27  GLUCOSE 140*  BUN 8  CREATININE 0.74  CALCIUM 7.4*  MG 1.2*   GFR: CrCl cannot be calculated (Unknown ideal weight.). Recent Labs  Lab 04/07/23 1026 04/07/23 1346  WBC 10.2  --   LATICACIDVEN 5.6* 3.7*    Liver Function Tests: Recent Labs  Lab 04/07/23 1026  AST 44*  ALT 13  ALKPHOS 153*  BILITOT 1.6*  PROT 5.6*  ALBUMIN 2.3*   Recent Labs  Lab 04/07/23 1026  LIPASE 38   No results for input(s): "AMMONIA" in the last 168 hours.  ABG    Component Value Date/Time   HCO3 29.7 (H) 02/22/2023 2132   O2SAT 69.3 02/22/2023 2132     Coagulation Profile: Recent Labs  Lab 04/07/23 1026  INR 1.6*    Cardiac Enzymes: No results for input(s): "CKTOTAL", "CKMB", "CKMBINDEX", "TROPONINI" in the last 168 hours.  HbA1C: Hgb A1c MFr Bld  Date/Time Value Ref Range Status  02/22/2023 04:50 AM 5.6 4.8 - 5.6 % Final    Comment:    (NOTE)         Prediabetes: 5.7 - 6.4         Diabetes: >6.4         Glycemic control for adults with diabetes: <7.0   10/01/2022 01:37 AM 4.7 (L) 4.8 - 5.6 % Final    Comment:    (NOTE) Pre diabetes:          5.7%-6.4%  Diabetes:              >6.4%  Glycemic control for   <7.0% adults with diabetes     CBG: No results for input(s): "GLUCAP" in the last 168 hours.  Review of Systems:   All negative; except for those that are bolded, which indicate positives.  Constitutional: weight loss, weight gain, night sweats, fevers, chills, fatigue, weakness.  HEENT: headaches, sore throat, sneezing, nasal congestion, post nasal drip, difficulty swallowing, tooth/dental problems, visual complaints, visual changes, ear aches. Neuro: difficulty with speech, weakness, numbness, ataxia. CV:  chest pain, orthopnea, PND, swelling in lower extremities, dizziness, palpitations, syncope.  Resp: cough, hemoptysis, dyspnea, wheezing. GI: heartburn,  indigestion, abdominal pain, nausea, vomiting, diarrhea, constipation, change in bowel habits, loss of appetite, hematemesis, melena, hematochezia.  GU: dysuria, change in color of urine, urgency or frequency, flank pain, hematuria. MSK: joint pain or swelling, decreased range of motion. Psych: change in mood or affect, depression, anxiety, suicidal ideations, homicidal ideations. Skin: rash, itching, bruising.   Past Medical History:  He,  has a past medical history of CAD (coronary artery disease), Cardiac arrest - ventricular fibrillation, Chronic pain, Cocaine abuse, Depression with anxiety, ETOH abuse, GERD (gastroesophageal reflux disease), GI bleed, Hemorrhoids, Hyperlipidemia, Hypertension, Ischemic cardiomyopathy, Marijuana abuse, Migraines, and Tobacco abuse.   Surgical History:   Past Surgical History:  Procedure Laterality Date   COLONOSCOPY  08/09/2021   Procedure: COLONOSCOPY;  Surgeon: Midge Minium, MD;  Location: [facility] ENDOSCOPY;  Service: Endoscopy;;   CORONARY ANGIOPLASTY WITH STENT PLACEMENT  ESOPHAGOGASTRODUODENOSCOPY N/A 05/30/2022   Procedure: ESOPHAGOGASTRODUODENOSCOPY (EGD);  Surgeon: Toney Reil, MD;  Location: [facility] ENDOSCOPY;  Service: Gastroenterology;  Laterality: N/A;   ESOPHAGOGASTRODUODENOSCOPY (EGD) WITH PROPOFOL N/A 03/01/2021   Procedure: ESOPHAGOGASTRODUODENOSCOPY (EGD) WITH PROPOFOL;  Surgeon: Toney Reil, MD;  Location: [facility]GERY [facility];  Service: Endoscopy;  Laterality: N/A;   ESOPHAGOGASTRODUODENOSCOPY (EGD) WITH PROPOFOL N/A 08/09/2021   Procedure: ESOPHAGOGASTRODUODENOSCOPY (EGD) WITH PROPOFOL;  Surgeon: Midge Minium, MD;  Location: [facility] ENDOSCOPY;  Service: Endoscopy;  Laterality: N/A;   ESOPHAGOGASTRODUODENOSCOPY (EGD) WITH PROPOFOL N/A 03/01/2023   Procedure: ESOPHAGOGASTRODUODENOSCOPY (EGD) WITH PROPOFOL;  Surgeon: Jaynie Collins, DO;  Location: [facility] ENDOSCOPY;  Service: Gastroenterology;  Laterality: N/A;   EVALUATION UNDER  ANESTHESIA WITH HEMORRHOIDECTOMY N/A 10/31/2022   Procedure: EXAM UNDER ANESTHESIA WITH SUTURE LIGATION OF TWO BLEEDING PEDICLES;  Surgeon: Henrene Dodge, MD;  Location: [facility] ORS;  Service: General;  Laterality: N/A;   FLEXIBLE SIGMOIDOSCOPY N/A 10/02/2022   Procedure: FLEXIBLE SIGMOIDOSCOPY;  Surgeon: Meryl Dare, MD;  Location: [facility] ENDOSCOPY;  Service: Gastroenterology;  Laterality: N/A;   LEFT HEART CATH AND CORONARY ANGIOGRAPHY N/A 11/04/2022   Procedure: LEFT HEART CATH AND CORONARY ANGIOGRAPHY;  Surgeon: Iran Ouch, MD;  Location: [facility] INVASIVE CV LAB;  Service: Cardiovascular;  Laterality: N/A;   VISCERAL ANGIOGRAPHY N/A 10/24/2022   Procedure: VISCERAL ANGIOGRAPHY;  Surgeon: Annice Needy, MD;  Location: [facility] INVASIVE CV LAB;  Service: Cardiovascular;  Laterality: N/A;     Social History:   reports that he has been smoking cigarettes. He has a 20.50 pack-year smoking history. He has never used smokeless tobacco. He reports current alcohol use of about 12.0 standard drinks of alcohol per week. He reports current drug use. Drugs: Cocaine and Marijuana.   Family History:  His family history includes Alcohol abuse in his father; Coronary artery disease in his mother; Other in his paternal grandmother and another family member.   Allergies Allergies  Allergen Reactions   Penicillins Hives    Tolerated Ceftriaxone 10/2022   Other Rash    chives     Home Medications  Prior to Admission medications   Medication Sig Start Date End Date Taking? Authorizing Provider  acetaminophen (TYLENOL) 500 MG tablet Take 1 tablet (500 mg total) by mouth every 8 (eight) hours as needed for mild pain, fever or headache. 03/05/23   [name], MD  gabapentin (NEURONTIN) 300 MG capsule Take 1 capsule (300 mg total) by mouth 3 (three) times daily. 03/05/23 06/03/23  [name], MD  hydrocortisone (CORTEF) 10 MG tablet Take 1 tablet (10 mg total) by mouth daily. 03/05/23 03/04/24  [name],  MD  hydrocortisone (CORTEF) 5 MG tablet Take 1 tablet (5 mg total) by mouth daily after lunch. 03/05/23 03/04/24  [name], MD  ibuprofen (ADVIL) 800 MG tablet Take 800 mg by mouth every 8 (eight) hours as needed. 12/21/22   [provider]  lactulose (CHRONULAC) 10 GM/15ML solution Take 30 mLs (20 g total) by mouth 2 (two) times daily. Titrate to 1-2 BM per day 03/05/23   [name], MD  methocarbamol (ROBAXIN) 750 MG tablet Take 1 tablet (750 mg total) by mouth daily as needed for muscle spasms. 03/05/23   [name], MD  midodrine (PROAMATINE) 10 MG tablet Take 1 tablet (10 mg total) by mouth 3 (three) times daily with meals. Skip the dose if Systolic BP greater than 110 mmHg and or HR greater than 100 03/05/23 06/03/23  [name], MD  Multiple Vitamin (  MULTIVITAMIN WITH MINERALS) TABS tablet Take 1 tablet by mouth daily. 03/06/23 03/05/24  [name], MD  nitroGLYCERIN (NITROSTAT) 0.4 MG SL tablet Place 1 tablet (0.4 mg total) under the tongue every 5 (five) minutes as needed for chest pain. 03/05/23   [name], MD  pantoprazole (PROTONIX) 40 MG tablet Take 1 tablet (40 mg total) by mouth 2 (two) times daily. 03/05/23 03/04/24  [name], MD  QUEtiapine (SEROQUEL) 25 MG tablet Take 1 tablet (25 mg total) by mouth at bedtime. 03/05/23 06/03/23  [name], MD  rosuvastatin (CRESTOR) 40 MG tablet Take 1 tablet (40 mg total) by mouth daily. 03/05/23 03/04/24  [name], MD  spironolactone (ALDACTONE) 25 MG tablet Take 0.5 tablets (12.5 mg total) by mouth daily. 03/06/23 06/04/23  [name], MD  torsemide 40 MG TABS Take 40 mg by mouth 2 (two) times daily for 14 days, THEN 40 mg daily. Patient not taking: Reported on 04/07/2023 03/05/23 03/18/24  [name], MD     Critical care time: 35 min.    Rutherford Guys, PA - [facility] For pager details, please see AMION or use Epic chat  After 1900, please call [facility] for cross coverage  needs 04/07/2023, 8:51 PM

## 2023-04-07 NOTE — ED Provider Notes (Signed)
Vitals:   04/07/23 1624 04/07/23 1700  BP: 114/62 117/70  Pulse: 98 97  Resp: 14 12  Temp: 98.2 F (36.8 C)   SpO2: 95% 97%     Patient resting comfortably.  No further hematemesis vital signs are normal.  Patient has been accepted and bed available at [facility].  Recommended transfer to [facility] for GI consultation, critical care admission, and availability of TIPS if needed. Too unstable to be managed here if TIPS needed per Dr. Tobi Bastos  Patient understand agreeable plan for transfer.  Advises having mild abdominal pain requesting dose of morphine prior to transfer   Sharyn Creamer, MD 04/07/23 1755

## 2023-04-08 ENCOUNTER — Inpatient Hospital Stay (HOSPITAL_COMMUNITY): Payer: Medicare HMO

## 2023-04-08 ENCOUNTER — Inpatient Hospital Stay (HOSPITAL_COMMUNITY): Payer: Medicare HMO | Admitting: Anesthesiology

## 2023-04-08 ENCOUNTER — Encounter (HOSPITAL_COMMUNITY)
Admission: RE | Disposition: A | Discharge status: Home / Self Care | Payer: Self-pay | Source: Other Acute Inpatient Hospital | Attending: Internal Medicine

## 2023-04-08 ENCOUNTER — Encounter (HOSPITAL_COMMUNITY): Payer: Self-pay | Admitting: Pulmonary Disease

## 2023-04-08 DIAGNOSIS — I8501 Esophageal varices with bleeding: Secondary | ICD-10-CM | POA: Diagnosis not present

## 2023-04-08 DIAGNOSIS — I864 Gastric varices: Secondary | ICD-10-CM

## 2023-04-08 DIAGNOSIS — K209 Esophagitis, unspecified without bleeding: Secondary | ICD-10-CM

## 2023-04-08 DIAGNOSIS — K922 Gastrointestinal hemorrhage, unspecified: Secondary | ICD-10-CM | POA: Diagnosis not present

## 2023-04-08 DIAGNOSIS — K449 Diaphragmatic hernia without obstruction or gangrene: Secondary | ICD-10-CM

## 2023-04-08 DIAGNOSIS — K3189 Other diseases of stomach and duodenum: Secondary | ICD-10-CM | POA: Diagnosis not present

## 2023-04-08 DIAGNOSIS — D649 Anemia, unspecified: Secondary | ICD-10-CM

## 2023-04-08 DIAGNOSIS — I8511 Secondary esophageal varices with bleeding: Secondary | ICD-10-CM

## 2023-04-08 DIAGNOSIS — K766 Portal hypertension: Secondary | ICD-10-CM

## 2023-04-08 DIAGNOSIS — K920 Hematemesis: Secondary | ICD-10-CM | POA: Diagnosis not present

## 2023-04-08 DIAGNOSIS — F418 Other specified anxiety disorders: Secondary | ICD-10-CM

## 2023-04-08 DIAGNOSIS — J9601 Acute respiratory failure with hypoxia: Secondary | ICD-10-CM | POA: Diagnosis not present

## 2023-04-08 DIAGNOSIS — K7460 Unspecified cirrhosis of liver: Secondary | ICD-10-CM

## 2023-04-08 DIAGNOSIS — K2289 Other specified disease of esophagus: Secondary | ICD-10-CM

## 2023-04-08 HISTORY — PX: ESOPHAGOGASTRODUODENOSCOPY (EGD) WITH PROPOFOL: SHX5813

## 2023-04-08 HISTORY — PX: ESOPHAGEAL BANDING: SHX5518

## 2023-04-08 LAB — CBC
HCT: 19.2 % — ABNORMAL LOW (ref 39.0–52.0)
HCT: 30.5 % — ABNORMAL LOW (ref 39.0–52.0)
HCT: 39.6 % (ref 39.0–52.0)
Hemoglobin: 6.2 g/dL — CL (ref 13.0–17.0)
Hemoglobin: 10.4 g/dL — ABNORMAL LOW (ref 13.0–17.0)
Hemoglobin: 13 g/dL (ref 13.0–17.0)
MCH: 29.4 pg (ref 26.0–34.0)
MCH: 29.5 pg (ref 26.0–34.0)
MCH: 29 pg (ref 26.0–34.0)
MCHC: 32.3 g/dL (ref 30.0–36.0)
MCHC: 34.1 g/dL (ref 30.0–36.0)
MCHC: 32.8 g/dL (ref 30.0–36.0)
MCV: 91 fL (ref 80.0–100.0)
MCV: 86.4 fL (ref 80.0–100.0)
MCV: 88.2 fL (ref 80.0–100.0)
Platelets: 82 10*3/uL — ABNORMAL LOW (ref 150–400)
Platelets: 99 10*3/uL — ABNORMAL LOW (ref 150–400)
Platelets: 171 10*3/uL (ref 150–400)
RBC: 2.11 MIL/uL — ABNORMAL LOW (ref 4.22–5.81)
RBC: 3.53 MIL/uL — ABNORMAL LOW (ref 4.22–5.81)
RBC: 4.49 MIL/uL (ref 4.22–5.81)
RDW: 16.6 % — ABNORMAL HIGH (ref 11.5–15.5)
RDW: 17.2 % — ABNORMAL HIGH (ref 11.5–15.5)
RDW: 18.1 % — ABNORMAL HIGH (ref 11.5–15.5)
WBC: 4.7 10*3/uL (ref 4.0–10.5)
WBC: 10.5 10*3/uL (ref 4.0–10.5)
WBC: 19.7 10*3/uL — ABNORMAL HIGH (ref 4.0–10.5)
nRBC: 0 % (ref 0.0–0.2)
nRBC: 0 % (ref 0.0–0.2)
nRBC: 0.3 % — ABNORMAL HIGH (ref 0.0–0.2)

## 2023-04-08 LAB — PREPARE RBC (CROSSMATCH)

## 2023-04-08 LAB — POCT I-STAT 7, (LYTES, BLD GAS, ICA,H+H)
Acid-base deficit: 2 mmol/L (ref 0.0–2.0)
Acid-base deficit: 1 mmol/L (ref 0.0–2.0)
Bicarbonate: 26.1 mmol/L (ref 20.0–28.0)
Bicarbonate: 25.7 mmol/L (ref 20.0–28.0)
Calcium, Ion: 1.16 mmol/L (ref 1.15–1.40)
Calcium, Ion: 1.15 mmol/L (ref 1.15–1.40)
HCT: 39 % (ref 39.0–52.0)
HCT: 37 % — ABNORMAL LOW (ref 39.0–52.0)
Hemoglobin: 13.3 g/dL (ref 13.0–17.0)
Hemoglobin: 12.6 g/dL — ABNORMAL LOW (ref 13.0–17.0)
O2 Saturation: 92 %
O2 Saturation: 96 %
Patient temperature: 97.81
Patient temperature: 97.8
Potassium: 2.9 mmol/L — ABNORMAL LOW (ref 3.5–5.1)
Potassium: 2.9 mmol/L — ABNORMAL LOW (ref 3.5–5.1)
Sodium: 144 mmol/L (ref 135–145)
Sodium: 144 mmol/L (ref 135–145)
TCO2: 28 mmol/L (ref 22–32)
TCO2: 27 mmol/L (ref 22–32)
pCO2 arterial: 57.6 mmHg — ABNORMAL HIGH (ref 32–48)
pCO2 arterial: 49.5 mmHg — ABNORMAL HIGH (ref 32–48)
pH, Arterial: 7.263 — ABNORMAL LOW (ref 7.35–7.45)
pH, Arterial: 7.321 — ABNORMAL LOW (ref 7.35–7.45)
pO2, Arterial: 73 mmHg — ABNORMAL LOW (ref 83–108)
pO2, Arterial: 84 mmHg (ref 83–108)

## 2023-04-08 LAB — TYPE AND SCREEN
ABO/RH(D): A POS
Antibody Screen: NEGATIVE
Unit division: 0
Unit division: 0

## 2023-04-08 LAB — BPAM RBC
Blood Product Expiration Date: 202405232359
Blood Product Expiration Date: 202405232359
Blood Product Expiration Date: 202405232359
Blood Product Expiration Date: 202405052359
Unit Type and Rh: 6200
Unit Type and Rh: 6200
Unit Type and Rh: 6200

## 2023-04-08 LAB — POCT I-STAT, CHEM 8
BUN: 13 mg/dL (ref 6–20)
Calcium, Ion: 1.13 mmol/L — ABNORMAL LOW (ref 1.15–1.40)
Chloride: 105 mmol/L (ref 98–111)
Creatinine, Ser: 0.5 mg/dL — ABNORMAL LOW (ref 0.61–1.24)
Glucose, Bld: 143 mg/dL — ABNORMAL HIGH (ref 70–99)
HCT: 27 % — ABNORMAL LOW (ref 39.0–52.0)
Hemoglobin: 9.2 g/dL — ABNORMAL LOW (ref 13.0–17.0)
Potassium: 3.4 mmol/L — ABNORMAL LOW (ref 3.5–5.1)
Sodium: 143 mmol/L (ref 135–145)
TCO2: 26 mmol/L (ref 22–32)

## 2023-04-08 LAB — COMPREHENSIVE METABOLIC PANEL
ALT: 19 U/L (ref 0–44)
AST: 49 U/L — ABNORMAL HIGH (ref 15–41)
Albumin: 2.8 g/dL — ABNORMAL LOW (ref 3.5–5.0)
Alkaline Phosphatase: 180 U/L — ABNORMAL HIGH (ref 38–126)
Anion gap: 15 (ref 5–15)
BUN: 14 mg/dL (ref 6–20)
CO2: 24 mmol/L (ref 22–32)
Calcium: 8.2 mg/dL — ABNORMAL LOW (ref 8.9–10.3)
Chloride: 102 mmol/L (ref 98–111)
Creatinine, Ser: 0.82 mg/dL (ref 0.61–1.24)
GFR, Estimated: >60 mL/min (ref >60)
Glucose, Bld: 212 mg/dL — ABNORMAL HIGH (ref 70–99)
Potassium: 2.8 mmol/L — ABNORMAL LOW (ref 3.5–5.1)
Sodium: 141 mmol/L (ref 135–145)
Total Bilirubin: 3.6 mg/dL — ABNORMAL HIGH (ref 0.3–1.2)
Total Protein: 7 g/dL (ref 6.5–8.1)

## 2023-04-08 LAB — GLUCOSE, CAPILLARY
Glucose-Capillary: 154 mg/dL — ABNORMAL HIGH (ref 70–99)
Glucose-Capillary: 165 mg/dL — ABNORMAL HIGH (ref 70–99)
Glucose-Capillary: 154 mg/dL — ABNORMAL HIGH (ref 70–99)
Glucose-Capillary: 165 mg/dL — ABNORMAL HIGH (ref 70–99)
Glucose-Capillary: 188 mg/dL — ABNORMAL HIGH (ref 70–99)

## 2023-04-08 LAB — MAGNESIUM: Magnesium: 2.1 mg/dL (ref 1.7–2.4)

## 2023-04-08 LAB — BASIC METABOLIC PANEL
Anion gap: 6 (ref 5–15)
BUN: 11 mg/dL (ref 6–20)
CO2: 28 mmol/L (ref 22–32)
Calcium: 7.4 mg/dL — ABNORMAL LOW (ref 8.9–10.3)
Chloride: 107 mmol/L (ref 98–111)
Creatinine, Ser: 0.67 mg/dL (ref 0.61–1.24)
GFR, Estimated: >60 mL/min (ref >60)
Glucose, Bld: 161 mg/dL — ABNORMAL HIGH (ref 70–99)
Potassium: 3.4 mmol/L — ABNORMAL LOW (ref 3.5–5.1)
Sodium: 141 mmol/L (ref 135–145)

## 2023-04-08 LAB — PHOSPHORUS: Phosphorus: 2.4 mg/dL — ABNORMAL LOW (ref 2.5–4.6)

## 2023-04-08 SURGERY — ESOPHAGOGASTRODUODENOSCOPY (EGD) WITH PROPOFOL
Anesthesia: General

## 2023-04-08 MED ORDER — SODIUM CHLORIDE 0.9 % IV SOLN
1.0000 g | INTRAVENOUS | Status: DC
  Administered 2023-04-09 – 2023-04-11 (×3): 1 g via INTRAVENOUS
  Filled 2023-04-08 (×3): qty 10

## 2023-04-08 MED ORDER — SODIUM PHOSPHATES 45 MMOLE/15ML IV SOLN
15.0000 mmol | Freq: Once | INTRAVENOUS | Status: AC
  Administered 2023-04-08: 15 mmol via INTRAVENOUS
  Filled 2023-04-08: qty 5

## 2023-04-08 MED ORDER — CHLORHEXIDINE GLUCONATE 0.12 % MT SOLN
15.0000 mL | Freq: Once | OROMUCOSAL | Status: DC
Start: 2023-04-08 — End: 2023-04-08

## 2023-04-08 MED ORDER — SODIUM CHLORIDE 0.9% IV SOLUTION: Freq: Once | INTRAVENOUS | Status: AC

## 2023-04-08 MED ORDER — IPRATROPIUM-ALBUTEROL 0.5-2.5 (3) MG/3ML IN SOLN
3.0000 mL | Freq: Once | RESPIRATORY_TRACT | Status: AC
  Administered 2023-04-08: 3 mL via RESPIRATORY_TRACT

## 2023-04-08 MED ORDER — DEXMEDETOMIDINE HCL IN NACL 400 MCG/100ML IV SOLN
0.0000 ug/kg/h | INTRAVENOUS | Status: DC
  Administered 2023-04-08 – 2023-04-09 (×3): 0.4 ug/kg/h via INTRAVENOUS
  Administered 2023-04-10 (×2): 0.8 ug/kg/h via INTRAVENOUS
  Filled 2023-04-08 (×3): qty 100

## 2023-04-08 MED ORDER — POTASSIUM CHLORIDE 10 MEQ/50ML IV SOLN
10.0000 meq | INTRAVENOUS | Status: AC
  Administered 2023-04-08 (×2): 10 meq via INTRAVENOUS

## 2023-04-08 MED ORDER — PROPOFOL 1000 MG/100ML IV EMUL
5.0000 ug/kg/min | INTRAVENOUS | Status: DC
  Administered 2023-04-09: 5 ug/kg/min via INTRAVENOUS
  Administered 2023-04-09: 30 ug/kg/min via INTRAVENOUS
  Administered 2023-04-09: 35 ug/kg/min via INTRAVENOUS
  Filled 2023-04-08 (×3): qty 100

## 2023-04-08 MED ORDER — ORAL CARE MOUTH RINSE: 15.0000 mL | OROMUCOSAL | Status: DC | PRN

## 2023-04-08 MED ORDER — CALCIUM GLUCONATE-NACL 2-0.675 GM/100ML-% IV SOLN
2.0000 g | Freq: Once | INTRAVENOUS | Status: AC
  Administered 2023-04-08: 2000 mg via INTRAVENOUS
  Filled 2023-04-08: qty 100

## 2023-04-08 MED ORDER — HYDROMORPHONE HCL 1 MG/ML IJ SOLN
1.0000 mg | Freq: Once | INTRAMUSCULAR | Status: AC
  Administered 2023-04-08: 1 mg via INTRAVENOUS
  Filled 2023-04-08: qty 1

## 2023-04-08 MED ORDER — FUROSEMIDE 10 MG/ML IJ SOLN
40.0000 mg | Freq: Once | INTRAMUSCULAR | Status: AC
  Administered 2023-04-08: 40 mg via INTRAVENOUS
  Filled 2023-04-08: qty 4

## 2023-04-08 MED ORDER — SODIUM CHLORIDE 0.9 % IV SOLN
2.0000 g | INTRAVENOUS | Status: DC
  Administered 2023-04-08: 2 g via INTRAVENOUS
  Filled 2023-04-08: qty 20

## 2023-04-08 MED ORDER — ROCURONIUM BROMIDE 10 MG/ML (PF) SYRINGE
PREFILLED_SYRINGE | INTRAVENOUS | Status: AC
  Filled 2023-04-08: qty 10

## 2023-04-08 MED ORDER — MIDAZOLAM HCL 2 MG/2ML IJ SOLN
INTRAMUSCULAR | Status: DC | PRN
  Administered 2023-04-08: 1 mg via INTRAVENOUS

## 2023-04-08 MED ORDER — NOREPINEPHRINE 4 MG/250ML-% IV SOLN
0.0000 ug/min | INTRAVENOUS | Status: DC
  Administered 2023-04-08: 2 ug/min via INTRAVENOUS
  Filled 2023-04-08 (×2): qty 250

## 2023-04-08 MED ORDER — IPRATROPIUM-ALBUTEROL 0.5-2.5 (3) MG/3ML IN SOLN
RESPIRATORY_TRACT | Status: AC
  Filled 2023-04-08: qty 3

## 2023-04-08 MED ORDER — KETAMINE HCL 50 MG/5ML IJ SOSY
PREFILLED_SYRINGE | INTRAMUSCULAR | Status: AC
  Administered 2023-04-08: 90 mg
  Filled 2023-04-08: qty 10

## 2023-04-08 MED ORDER — PHENYLEPHRINE 80 MCG/ML (10ML) SYRINGE FOR IV PUSH (FOR BLOOD PRESSURE SUPPORT)
PREFILLED_SYRINGE | INTRAVENOUS | Status: AC
  Filled 2023-04-08: qty 10

## 2023-04-08 MED ORDER — LACTATED RINGERS IV SOLN: INTRAVENOUS | Status: DC | PRN

## 2023-04-08 MED ORDER — FENTANYL CITRATE PF 50 MCG/ML IJ SOSY
25.0000 ug | PREFILLED_SYRINGE | INTRAMUSCULAR | Status: DC | PRN
  Administered 2023-04-08: 25 ug via INTRAVENOUS
  Filled 2023-04-08: qty 1

## 2023-04-08 MED ORDER — PHENYLEPHRINE 80 MCG/ML (10ML) SYRINGE FOR IV PUSH (FOR BLOOD PRESSURE SUPPORT)
PREFILLED_SYRINGE | INTRAVENOUS | Status: DC | PRN
  Administered 2023-04-08: 80 ug via INTRAVENOUS
  Administered 2023-04-08: 160 ug via INTRAVENOUS

## 2023-04-08 MED ORDER — HYDROMORPHONE HCL-NACL 50-0.9 MG/50ML-% IV SOLN: 1.0000 mg/h | INTRAVENOUS | Status: DC

## 2023-04-08 MED ORDER — SUCCINYLCHOLINE CHLORIDE 200 MG/10ML IV SOSY
PREFILLED_SYRINGE | INTRAVENOUS | Status: DC | PRN
  Administered 2023-04-08: 100 mg via INTRAVENOUS

## 2023-04-08 MED ORDER — SODIUM CHLORIDE 0.9 % IV SOLN: INTRAVENOUS | Status: DC

## 2023-04-08 MED ORDER — MIDAZOLAM HCL 2 MG/2ML IJ SOLN
INTRAMUSCULAR | Status: AC
  Filled 2023-04-08: qty 2

## 2023-04-08 MED ORDER — HYDROMORPHONE HCL-NACL 50-0.9 MG/50ML-% IV SOLN
0.5000 mg/h | INTRAVENOUS | Status: DC
  Administered 2023-04-08: 1 mg/h via INTRAVENOUS
  Filled 2023-04-08: qty 50

## 2023-04-08 MED ORDER — HYDROMORPHONE BOLUS VIA INFUSION
0.2500 mg | INTRAVENOUS | Status: DC | PRN
  Administered 2023-04-09: 1 mg via INTRAVENOUS
  Administered 2023-04-09: 0.5 mg via INTRAVENOUS
  Administered 2023-04-09: 2 mg via INTRAVENOUS
  Administered 2023-04-09: 1 mg via INTRAVENOUS

## 2023-04-08 MED ORDER — FENTANYL CITRATE PF 50 MCG/ML IJ SOSY
PREFILLED_SYRINGE | INTRAMUSCULAR | Status: AC
  Filled 2023-04-08: qty 2

## 2023-04-08 MED ORDER — SUCCINYLCHOLINE CHLORIDE 200 MG/10ML IV SOSY
PREFILLED_SYRINGE | INTRAVENOUS | Status: AC
  Filled 2023-04-08: qty 10

## 2023-04-08 MED ORDER — DEXAMETHASONE SODIUM PHOSPHATE 10 MG/ML IJ SOLN
INTRAMUSCULAR | Status: DC | PRN
  Administered 2023-04-08: 5 mg via INTRAVENOUS

## 2023-04-08 MED ORDER — DEXMEDETOMIDINE HCL IN NACL 400 MCG/100ML IV SOLN
INTRAVENOUS | Status: AC
  Filled 2023-04-08: qty 100

## 2023-04-08 MED ORDER — ROCURONIUM BROMIDE 10 MG/ML (PF) SYRINGE
PREFILLED_SYRINGE | INTRAVENOUS | Status: AC
  Administered 2023-04-08: 100 mg
  Filled 2023-04-08: qty 10

## 2023-04-08 MED ORDER — MIDAZOLAM HCL 2 MG/2ML IJ SOLN
INTRAMUSCULAR | Status: AC
  Filled 2023-04-08: qty 4

## 2023-04-08 MED ORDER — PROPOFOL 10 MG/ML IV BOLUS
INTRAVENOUS | Status: DC | PRN
  Administered 2023-04-08: 130 mg via INTRAVENOUS

## 2023-04-08 MED ORDER — MIDAZOLAM HCL 2 MG/2ML IJ SOLN
INTRAMUSCULAR | Status: AC
  Administered 2023-04-08: 2 mg
  Filled 2023-04-08: qty 2

## 2023-04-08 MED ORDER — FENTANYL CITRATE (PF) 100 MCG/2ML IJ SOLN
INTRAMUSCULAR | Status: DC | PRN
  Administered 2023-04-08: 50 ug via INTRAVENOUS

## 2023-04-08 MED ORDER — MIDAZOLAM HCL 2 MG/2ML IJ SOLN
4.0000 mg | Freq: Once | INTRAMUSCULAR | Status: AC
  Administered 2023-04-08: 4 mg via INTRAVENOUS

## 2023-04-08 MED ORDER — FENTANYL CITRATE PF 50 MCG/ML IJ SOSY
PREFILLED_SYRINGE | INTRAMUSCULAR | Status: AC
  Administered 2023-04-08: 100 ug
  Filled 2023-04-08: qty 2

## 2023-04-08 MED ORDER — ETOMIDATE 2 MG/ML IV SOLN
INTRAVENOUS | Status: AC
  Filled 2023-04-08: qty 20

## 2023-04-08 MED ORDER — CHLORHEXIDINE GLUCONATE CLOTH 2 % EX PADS
6.0000 | MEDICATED_PAD | Freq: Every day | CUTANEOUS | Status: DC
  Administered 2023-04-08 – 2023-04-12 (×6): 6 via TOPICAL

## 2023-04-08 MED ORDER — LACTULOSE 10 GM/15ML PO SOLN
30.0000 g | Freq: Two times a day (BID) | ORAL | Status: DC
  Administered 2023-04-08 – 2023-04-12 (×4): 30 g via ORAL
  Filled 2023-04-08 (×4): qty 45

## 2023-04-08 MED ORDER — ONDANSETRON HCL 4 MG/2ML IJ SOLN
INTRAMUSCULAR | Status: DC | PRN
  Administered 2023-04-08: 4 mg via INTRAVENOUS

## 2023-04-08 SURGICAL SUPPLY — 15 items
BLOCK BITE 60FR ADLT L/F BLUE (MISCELLANEOUS) ×2 IMPLANT
ELECT REM PT RETURN 9FT ADLT (ELECTROSURGICAL) IMPLANT
ELECTRODE REM PT RTRN 9FT ADLT (ELECTROSURGICAL) IMPLANT
FORCEP RJ3 GP 1.8X160 W-NEEDLE (CUTTING FORCEPS) IMPLANT
FORCEPS BIOP RAD 4 LRG CAP 4 (CUTTING FORCEPS) IMPLANT
NDL SCLEROTHERAPY 25GX240 (NEEDLE) IMPLANT
NEEDLE SCLEROTHERAPY 25GX240 (NEEDLE) IMPLANT
PROBE APC STR FIRE (PROBE) IMPLANT
PROBE INJECTION GOLD (MISCELLANEOUS)
PROBE INJECTION GOLD 7FR (MISCELLANEOUS) IMPLANT
SNARE SHORT THROW 13M SML OVAL (MISCELLANEOUS) IMPLANT
SYR 50ML LL SCALE MARK (SYRINGE) IMPLANT
TUBING ENDO SMARTCAP PENTAX (MISCELLANEOUS) ×4 IMPLANT
TUBING IRRIGATION ENDOGATOR (MISCELLANEOUS) ×2 IMPLANT
WATER STERILE IRR 1000ML POUR (IV SOLUTION) IMPLANT

## 2023-04-08 NOTE — Progress Notes (Signed)
Pt stable for transport. Endo RN @ bedside and report given

## 2023-04-08 NOTE — Progress Notes (Signed)
Hgb 6.2 called in as a critical at 03:13

## 2023-04-08 NOTE — Progress Notes (Signed)
Pt had a BM and then became restless with new-onset respiratory distress. Coarse lung sounds on assessment. Pt tripoding with unrelenting cough to the point he started gagging w/ 1 emesis episode. He stated, "the last time this happened, they said I was drowning." After additional assessment, he had new crackles bilaterally. Pt sating in 60s, lips cyanotic. Ground team at bedside. Emergently intubated.  Lasix IVP given.

## 2023-04-08 NOTE — Progress Notes (Signed)
Pt off unit @ this time

## 2023-04-08 NOTE — Progress Notes (Signed)
Pt back on unit and on re breather

## 2023-04-08 NOTE — Consult Note (Signed)
Referring Provider: Dr. Levy Pupa Primary Care Physician:  [name], NP Primary Gastroenterologist:  Dr. Allegra Lai  Reason for Consultation:  UGI bleed/hematemesis, cirrhosis   HPI: Ethan Wells is a 52 y.o. male with a past medical history of anxiety, depression, adrenal insufficiency, hypertension, hyperlipidemia, coronary artery disease s/p STEMI  with V-fib arrest 2012, ischemic cardiomyopathy, colon polyps, polysubstance abuse, alcohol associated cirrhosis with recurrent esophageal variceal bleed and erosive esophagitis.  He presented to [facility] ED 04/07/2019 for with hematemesis and abdominal pain.  He was previously hospitalized 1 month ago for upper GI bleed status post EGD with esophageal variceal banding  03/01/2023. TIPS procedure was recommended, however, the patient declined.  Labs in the ED showed a WBC count of 10.2.  Hemoglobin 7.1 down from 8.4 on 3/19. Repeat Hg 7.1 -> 7.0. Hematocrit 23.2.  Platelet 157.  NR 1.6.  Potassium 2.3.  Sodium 141.  BUN 8.  Creatinine 0.74.  Total bili 1.6.  Alk phos 153.  AST 44.  ALT 13.  Lipase 38. Ethyl alcohol 107.  Magnesium 1.2.  Lactic acid 5.6, repeat 3.7.  Urine drug screen positive for cocaine and benzodiazepine.  Cultures no growth at  < 24 hours.   He was transferred to [facility] for further GI management regarding upper GI bleeding secondary to esophageal varices and for possible TIPS procedure.  Labs today: Hemoglobin 6.2 -> transfused 1 unit of PRBC, 2nd unit currently transfusing.  Hematocrit 19.2.  Platelet 82.  He is currently on Levophed 3 mcg/minutes and PPI infusion.  He is lethargic but arousable.  He complains of having lower chest and epigastric pain.  No hematemesis since admission to [facility].  He passed 2 small volume black stools overnight without recurrence thus far this morning. Last alcohol intake and cocaine use was 04/06/2023.  He endorses drinking two 22 ounce beers daily.  GI  PROCEDURES:  EGD 03/01/2023: - Normal duodenal bulb, first portion of the duodenum and second portion of the duodenum.  - A large amount of food (residue) in the stomach.  - Gastritis. - Portal hypertensive gastropathy.  - Esophageal mucosal changes classified as Barrett's stage C0-M3 per Prague criteria.  - Small hiatal hernia.  - Grade II with red wale sign esophageal varices. Completely eradicated. Banded.  - No specimens collected. -Repeat EGD in 2 weeks for repeat banding  EGD 05/30/2022: - Alcoholic duodenitis.  - Portal hypertensive gastropathy.  - Esophageal mucosal changes suspicious for long-segment Barrett's esophagus. Biopsied.  - LA Grade D acute and erosive esophagitis with no bleeding. Biopsied.  - Normal upper third of esophagus and middle third of esophagus.  Sigmoidoscopy 10/02/2022 as an inpatient by Dr. Lawerance Cruel.: - Preparation of the colon was fair. - Moderate-sized Grade I internal hemorrhoids - The examined colon to 35 cm otherwise appeared normal. - No specimens collected.   EGD 08/09/2021: - Z-line irregular.  - Normal stomach.  - Normal examined duodenum.  - No specimens collected  Colonoscopy 08/09/2021: - Two 5 to 7 mm polyps in the ascending colon, removed with a cold snare. Resected and retrieved. - Non-bleeding internal hemorrhoids.   PAST IMAGE STUDIES:  RUQ sonogram 11/19/2022: FINDINGS: Gallbladder:   No gallstones or wall thickening visualized. No sonographic Murphy sign noted by sonographer.   Common bile duct:   Diameter: 0.5 cm, which is normal   Liver:   No focal lesion identified. Increased parenchymal echogenicity, which can with hepatic steatosis. Portal vein is patent on  color Doppler imaging with normal direction of blood flow towards the liver.   Other: None.   IMPRESSION: No sonographic finding to suggest cholecystitis.    Abdominal CTA 01/17/2023: VASCULAR   Findings suspicious for small active bleed in the rectum.    Severe stenosis at the origin of the celiac axis is redemonstrated. Patent stent at the origin of the SMA.   NON-VASCULAR   Wall thickening about the sigmoid colon/rectum as well as the transverse colon favored due to colitis however congestive colopathy could appear similarly.   Duodenal wall thickening suggesting duodenitis.   Cirrhosis and borderline splenomegaly compatible with portal hypertension.   These results were called by telephone at the time of interpretation on 01/17/2023 at 3:19 am to provider [facility] , who verbally acknowledged these results.   ECHO 01/17/2023: IMPRESSIONS Left ventricular ejection fraction, by estimation, is 50 to 55%. The left ventricle has low normal function. The left ventricle has no regional wall motion abnormalities. Left ventricular diastolic parameters are indeterminate. 1. 2. Right ventricular systolic function is normal. The right ventricular size is normal. The mitral valve is normal in structure. Mild mitral valve regurgitation. No evidence of mitral stenosis. 3. The aortic valve is normal in structure. Aortic valve regurgitation is not visualized. No aortic stenosis is present. 4. The inferior vena cava is normal in size with greater than 50% respiratory variability, suggesting right atrial pressure of 3 mmHg.  Past Medical History:  Diagnosis Date   CAD (coronary artery disease)    a. 01/2011 Anterior STEMI/Cath/PCI: LM nl, LAD 100d (3.5x12mm Vision BMS placed), LCX 76m, RI 50, RCA min irregs, EF 40% w/ apical, inferoapical HK; b. 10/2022 NSTSEMI/Cath: LM nl, LAD 5p/m, RI 85, LCX 174m, OM1 mild dzs, OM3 100 fills via L->L collats from dLAD, RCA 80p, 150m, RPDA fills via collats from LAD. EF 45-50%-->Med Rx.   Cardiac arrest - ventricular fibrillation    a. In setting of STEMI 01/2011   Chronic pain    Cocaine abuse    Depression with anxiety    ETOH abuse    a. 6-12 beers/day   GERD (gastroesophageal reflux disease)    GI bleed     Hemorrhoids    Hyperlipidemia    Hypertension    Ischemic cardiomyopathy    a. 06/2011 Echo: EF 45-50%, No rwma; b. 10/2022 Echo: EF 55-60%, no rwma, nl RV fxn.   Marijuana abuse    Migraines    Tobacco abuse    a. 1/2 ppd x 26 yrs    Past Surgical History:  Procedure Laterality Date   COLONOSCOPY  08/09/2021   Procedure: COLONOSCOPY;  Surgeon: Midge Minium, MD;  Location: [facility] ENDOSCOPY;  Service: Endoscopy;;   CORONARY ANGIOPLASTY WITH STENT PLACEMENT     ESOPHAGOGASTRODUODENOSCOPY N/A 05/30/2022   Procedure: ESOPHAGOGASTRODUODENOSCOPY (EGD);  Surgeon: Toney Reil, MD;  Location: [facility] ENDOSCOPY;  Service: Gastroenterology;  Laterality: N/A;   ESOPHAGOGASTRODUODENOSCOPY (EGD) WITH PROPOFOL N/A 03/01/2021   Procedure: ESOPHAGOGASTRODUODENOSCOPY (EGD) WITH PROPOFOL;  Surgeon: Toney Reil, MD;  Location: [facility];  Service: Endoscopy;  Laterality: N/A;   ESOPHAGOGASTRODUODENOSCOPY (EGD) WITH PROPOFOL N/A 08/09/2021   Procedure: ESOPHAGOGASTRODUODENOSCOPY (EGD) WITH PROPOFOL;  Surgeon: Midge Minium, MD;  Location: [facility] ENDOSCOPY;  Service: Endoscopy;  Laterality: N/A;   ESOPHAGOGASTRODUODENOSCOPY (EGD) WITH PROPOFOL N/A 03/01/2023   Procedure: ESOPHAGOGASTRODUODENOSCOPY (EGD) WITH PROPOFOL;  Surgeon: Jaynie Collins, DO;  Location: [facility] ENDOSCOPY;  Service: Gastroenterology;  Laterality: N/A;   EVALUATION UNDER ANESTHESIA WITH HEMORRHOIDECTOMY N/A  10/31/2022   Procedure: EXAM UNDER ANESTHESIA WITH SUTURE LIGATION OF TWO BLEEDING PEDICLES;  Surgeon: Henrene Dodge, MD;  Location: [facility] ORS;  Service: General;  Laterality: N/A;   FLEXIBLE SIGMOIDOSCOPY N/A 10/02/2022   Procedure: FLEXIBLE SIGMOIDOSCOPY;  Surgeon: Meryl Dare, MD;  Location: [facility] ENDOSCOPY;  Service: Gastroenterology;  Laterality: N/A;   LEFT HEART CATH AND CORONARY ANGIOGRAPHY N/A 11/04/2022   Procedure: LEFT HEART CATH AND CORONARY ANGIOGRAPHY;  Surgeon: Iran Ouch, MD;  Location: [facility]  INVASIVE CV LAB;  Service: Cardiovascular;  Laterality: N/A;   VISCERAL ANGIOGRAPHY N/A 10/24/2022   Procedure: VISCERAL ANGIOGRAPHY;  Surgeon: Annice Needy, MD;  Location: [facility] INVASIVE CV LAB;  Service: Cardiovascular;  Laterality: N/A;    Prior to Admission medications   Medication Sig Start Date End Date Taking? Authorizing Provider  acetaminophen (TYLENOL) 500 MG tablet Take 1 tablet (500 mg total) by mouth every 8 (eight) hours as needed for mild pain, fever or headache. 03/05/23   [name], MD  gabapentin (NEURONTIN) 300 MG capsule Take 1 capsule (300 mg total) by mouth 3 (three) times daily. 03/05/23 06/03/23  [name], MD  hydrocortisone (CORTEF) 10 MG tablet Take 1 tablet (10 mg total) by mouth daily. 03/05/23 03/04/24  [name], MD  hydrocortisone (CORTEF) 5 MG tablet Take 1 tablet (5 mg total) by mouth daily after lunch. 03/05/23 03/04/24  [name], MD  ibuprofen (ADVIL) 800 MG tablet Take 800 mg by mouth every 8 (eight) hours as needed. 12/21/22   [provider]  lactulose (CHRONULAC) 10 GM/15ML solution Take 30 mLs (20 g total) by mouth 2 (two) times daily. Titrate to 1-2 BM per day 03/05/23   [name], MD  methocarbamol (ROBAXIN) 750 MG tablet Take 1 tablet (750 mg total) by mouth daily as needed for muscle spasms. 03/05/23   [name], MD  midodrine (PROAMATINE) 10 MG tablet Take 1 tablet (10 mg total) by mouth 3 (three) times daily with meals. Skip the dose if Systolic BP greater than 110 mmHg and or HR greater than 100 03/05/23 06/03/23  [name], MD  Multiple Vitamin (MULTIVITAMIN WITH MINERALS) TABS tablet Take 1 tablet by mouth daily. 03/06/23 03/05/24  [name], MD  nitroGLYCERIN (NITROSTAT) 0.4 MG SL tablet Place 1 tablet (0.4 mg total) under the tongue every 5 (five) minutes as needed for chest pain. 03/05/23   [name], MD  pantoprazole (PROTONIX) 40 MG tablet Take 1 tablet (40 mg total) by mouth 2 (two) times daily. 03/05/23 03/04/24   [name], MD  QUEtiapine (SEROQUEL) 25 MG tablet Take 1 tablet (25 mg total) by mouth at bedtime. 03/05/23 06/03/23  [name], MD  rosuvastatin (CRESTOR) 40 MG tablet Take 1 tablet (40 mg total) by mouth daily. 03/05/23 03/04/24  [name], MD  spironolactone (ALDACTONE) 25 MG tablet Take 0.5 tablets (12.5 mg total) by mouth daily. 03/06/23 06/04/23  [name], MD  torsemide 40 MG TABS Take 40 mg by mouth 2 (two) times daily for 14 days, THEN 40 mg daily. Patient not taking: Reported on 04/07/2023 03/05/23 03/18/24  [name], MD    Current Facility-Administered Medications  Medication Dose Route Frequency Provider Last Rate Last Admin   0.9 %  sodium chloride infusion (Manually program via Guardrails IV Fluids)   Intravenous Once Danford Bad A, NP       calcium gluconate 2 g/ 100 mL sodium chloride IVPB  2 g Intravenous Once Whiteheart, Mellody Life, NP       [  START ON 04/09/2023] cefTRIAXone (ROCEPHIN) 1 g in sodium chloride 0.9 % 100 mL IVPB  1 g Intravenous Q24H Estill Batten W, RPH       Chlorhexidine Gluconate Cloth 2 % PADS 6 each  6 each Topical Daily Byrum, Les Pou, MD       docusate sodium (COLACE) capsule 100 mg  100 mg Oral BID PRN Celine Mans, Rahul P, PA-C       fentaNYL (SUBLIMAZE) injection 25 mcg  25 mcg Intravenous Q2H PRN Whiteheart, Mellody Life, NP       folic acid (FOLVITE) tablet 1 mg  1 mg Oral Daily Desai, Rahul P, PA-C   1 mg at 04/07/23 2116   hydrocortisone sodium succinate (SOLU-CORTEF) 100 MG injection 100 mg  100 mg Intravenous Q12H Desai, Rahul P, PA-C   100 mg at 04/07/23 2135   lactated ringers infusion   Intravenous Continuous Paliwal, Aditya, MD 150 mL/hr at 04/08/23 0800 Infusion Verify at 04/08/23 0800   lactulose (CHRONULAC) 10 GM/15ML solution 30 g  30 g Oral BID Francena Hanly, RPH       LORazepam (ATIVAN) tablet 1-4 mg  1-4 mg Oral Q1H PRN Celine Mans, Rahul P, PA-C   2 mg at 04/07/23 2115   Or   LORazepam (ATIVAN) injection 1-4 mg  1-4 mg  Intravenous Q1H PRN Desai, Rahul P, PA-C       midodrine (PROAMATINE) tablet 10 mg  10 mg Oral TID WC Desai, Rahul P, PA-C   10 mg at 04/08/23 0813   multivitamin with minerals tablet 1 tablet  1 tablet Oral Daily Celine Mans, Rahul P, PA-C   1 tablet at 04/07/23 2115   norepinephrine (LEVOPHED)  in (0.016 mg/mL) premix infusion  0-40 mcg/min Intravenous Titrated Paliwal, Aditya, MD 18.75 mL/hr at 04/08/23 0800 5 mcg/min at 04/08/23 0800   octreotide (SANDOSTATIN) 500 mcg in sodium chloride 0.9 % 250 mL (2 mcg/mL) infusion  50 mcg/hr Intravenous Continuous Paliwal, Aditya, MD 25 mL/hr at 04/08/23 0800 50 mcg/hr at 04/08/23 0800   [START ON 04/11/2023] pantoprazole (PROTONIX) injection 40 mg  40 mg Intravenous Q12H Paliwal, Aditya, MD       pantoprozole (PROTONIX) 80 mg /NS 100 mL infusion  8 mg/hr Intravenous Continuous Paliwal, Aditya, MD 10 mL/hr at 04/08/23 0800 8 mg/hr at 04/08/23 0800   polyethylene glycol (MIRALAX / GLYCOLAX) packet 17 g  17 g Oral Daily PRN Celine Mans, Rahul P, PA-C       sodium phosphate 15 mmol in dextrose 5 % 250 mL infusion  15 mmol Intravenous Once Whiteheart, Kathryn A, NP       thiamine (VITAMIN B1) tablet 100 mg  100 mg Oral Daily Desai, Rahul P, PA-C   100 mg at 04/07/23 2115   Or   thiamine (VITAMIN B1) injection 100 mg  100 mg Intravenous Daily Desai, Rahul P, PA-C        Allergies as of 04/07/2023 - Review Complete 04/07/2023  Allergen Reaction Noted   Penicillins Hives 02/26/2011   Other Rash 02/22/2021    Family History  Problem Relation Age of Onset   Coronary artery disease Mother        alive   Other Other        2 sisters alive and well   Alcohol abuse Father        died @ 4   Other Paternal Grandmother        PACER    Social History   Socioeconomic History  Marital status: Single    Spouse name: Not on file   Number of children: Not on file   Years of education: Not on file   Highest education level: Not on file  Occupational History    Not on file  Tobacco Use   Smoking status: Every Day    Packs/day: 0.50    Years: 41.00    Additional pack years: 0.00    Total pack years: 20.50    Types: Cigarettes   Smokeless tobacco: Never   Tobacco comments:    started age 11  Vaping Use   Vaping Use: Never used  Substance and Sexual Activity   Alcohol use: Yes    Alcohol/week: 12.0 standard drinks of alcohol    Types: 12 Cans of beer per week    Comment: at least a 6 pack of beer daily and often a 12 pack   Drug use: Yes    Types: Cocaine, Marijuana   Sexual activity: Not on file  Other Topics Concern   Not on file  Social History Narrative   Single, lives in [place] with his mother.  He does not routinely exercise.  He sometimes works, helping to deliver appliances to homes.   Social Determinants of Health   Financial Resource Strain: Not on file  Food Insecurity: No Food Insecurity (04/07/2023)   Hunger Vital Sign    Worried About Running Out of Food in the Last Year: Never true    Ran Out of Food in the Last Year: Never true  Transportation Needs: No Transportation Needs (04/07/2023)   PRAPARE - Administrator, Civil Service (Medical): No    Lack of Transportation (Non-Medical): No  Physical Activity: Not on file  Stress: Not on file  Social Connections: Not on file  Intimate Partner Violence: Not At Risk (04/07/2023)   Humiliation, Afraid, Rape, and Kick questionnaire    Fear of Current or Ex-Partner: No    Emotionally Abused: No    Physically Abused: No    Sexually Abused: No    Review of Systems: Limited review of systems as patient is lethargic, see HPI.  Physical Exam: Vital signs in last 24 hours: Temp:  [97.8 F (36.6 C)-98.8 F (37.1 C)] 98.6 F (37 C) (04/23 1030) Pulse Rate:  [63-107] 81 (04/23 1030) Resp:  [12-19] 15 (04/23 1030) BP: (86-135)/(50-77) 107/68 (04/23 1030) SpO2:  [90 %-100 %] 92 % (04/23 1030) Weight:  [89.4 kg-91.6 kg] 91.6 kg (04/23 0428) Last BM Date :  04/06/23 General:  Alert, lethargic but arousable ill appearing 52 year old male.  Head:  Normocephalic and atraumatic. Eyes:  No scleral icterus. Conjunctiva pink. Ears:  Normal auditory acuity. Nose:  No deformity, discharge or lesions. Mouth: Poor dentition.  No ulcers or lesions.  Neck:  Supple. No lymphadenopathy or thyromegaly.  Lungs: Breath sounds clear throughout. No wheezes, rhonchi or crackles.  Heart: Regular rate and rhythm, no murmurs. Abdomen: Protuberant, soft without any significant ascites appreciated, abdomen is not tense.  Hepatomegaly.  Positive bowel sounds all 4 quadrants. Rectal: Deferred. Musculoskeletal:  Symmetrical without gross deformities.  Pulses:  Normal pulses noted. Extremities:  Without clubbing or edema. Neurologic:  Alert and  oriented x 3.  Speech is clear.  Moves all extremities. Skin:  Intact without significant lesions or rashes. Psych:  Alert and cooperative. Normal mood and affect.  Intake/Output from previous day: 04/22 0701 - 04/23 0700 In: 2371.5 [I.V.:1521.2; Blood:446.7; IV Piggyback:403.6] Out: 1000 [Urine:1000] Intake/Output this shift:  Total I/O In: 180.5 [I.V.:180.5] Out: -   Lab Results: Recent Labs    04/07/23 1026 04/07/23 2110 04/08/23 0250  WBC 10.2 4.7 4.7  HGB 7.1* 7.0* 6.2*  HCT 23.2* 22.1* 19.2*  PLT 157 89* 82*   BMET Recent Labs    04/07/23 1026 04/07/23 2110 04/08/23 0250  NA 141 141 141  K 2.3* 2.7* 3.4*  CL 102 103 107  CO2 27 28 28   GLUCOSE 140* 152* 161*  BUN 8 11 11   CREATININE 0.74 0.73 0.67  CALCIUM 7.4* 7.3* 7.4*   LFT Recent Labs    04/07/23 2110  PROT 5.6*  ALBUMIN 2.3*  AST 47*  ALT 19  ALKPHOS 144*  BILITOT 2.6*   PT/INR Recent Labs    04/07/23 1026  LABPROT 18.9*  INR 1.6*   Hepatitis Panel No results for input(s): "HEPBSAG", "HCVAB", "HEPAIGM", "HEPBIGM" in the last 72 hours.    Studies/Results: DG Chest Port 1 View  Result Date: 04/07/2023 CLINICAL DATA:   Hematemesis and abdominal pain EXAM: PORTABLE CHEST 1 VIEW COMPARISON:  01/18/2023 FINDINGS: Unchanged cardiomegaly. No new focal pulmonary opacity. Chronic coarsened interstitial markings without overt pulmonary edema. No pleural effusion or pneumothorax. No acute osseous abnormality. IMPRESSION: Unchanged cardiomegaly. No acute cardiopulmonary process. Electronically Signed   By: Wiliam Ke M.D.   On: 04/07/2023 11:27    IMPRESSION/PLAN:  52 year old male with decompensated alcohol associated cirrhosis with esophageal varices and recurrent UGI/hematemesis with anemia. S/P esophageal banding at [facility] 03/01/2023.  Hypotensive on Levophed 3 mcg/minute. Hg 6.2 -> transfused one unit of PRBCs, 2nd unit not yet transfused.  He passed 2 small value melenic stools last night, no hematemesis since admission to [facility].  Questionable hepatic encephalopathy, on Lactulose twice daily. -NPO -EGD for esophageal variceal banding to rule out gastric varices/PUD -Continue octreotide and PPI infusions -Continue Rocephin IV for SBP prophylaxis -Continue Lactulose 30 g p.o. twice daily -Check H&H posttransfusion -Transfuse for hemoglobin less than 7 -Eventual abdominal sonogram during this hospitalization -Consider evaluation with IR for future TIPS, patient recently declined TIPS procedure -CIWA protocol in process -Continue folate, thiamine and multivitamin daily -Patient counseled pleat alcohol abstinence -Monitor neurostatus closely -Await further recommendations per Dr. Orvan Falconer -Will routine AFP/sono surveillance per primary GI at [facility]  Coagulopathy secondary to cirrhosis and mild splenomegaly  Hypokalemia, hypomagnesemia -Management per medical service  History of polysubstance abuse  Adrenal insufficiency on Solu-Cortef IV  History of CAD s/p MI and v-fib arrest   History of chronic systolic CHF.  LVEF 50 to 55%.  History of ischemic cardiomyopathy 2012   Malachi Carl [facility]  04/08/2023,  12:07PM

## 2023-04-08 NOTE — Progress Notes (Signed)
Endo RN made aware of pt still receiving PRBC. Endo RN continued  w transport

## 2023-04-08 NOTE — Procedures (Signed)
Intubation Procedure Note  AMAREE LEEPER  [id number]  05/26/1971  Date:04/08/23  Time:9:03 PM   Provider Performing:Rockie Vawter P Chestine Spore    Procedure: Intubation (31500)  Indication(s) Respiratory Failure  Consent Unable to obtain consent due to emergent nature of procedure.   Anesthesia Versed, Fentanyl, Rocuronium, and ketamine   Time Out Verified patient identification, verified procedure, site/side was marked, verified correct patient position, special equipment/implants available, medications/allergies/relevant history reviewed, required imaging and test results available.   Sterile Technique Usual hand hygeine, masks, and gloves were used   Procedure Description Patient positioned in bed supine.  Sedation given as noted above.  Patient was intubated with endotracheal tube using Glidescope.  View was Grade 1 full glottis .  Number of attempts was 1.  Colorimetric CO2 detector was consistent with tracheal placement.   Complications/Tolerance None; patient tolerated the procedure well. Chest X-ray is ordered to verify placement.   EBL Minimal   Specimen(s) None  Steffanie Dunn, DO 04/08/23 9:03 PM [facility]  For contact information, see Amion. If no response to pager, please call PCCM consult pager. After hours, 7PM- 7AM, please call Elink.

## 2023-04-08 NOTE — Anesthesia Postprocedure Evaluation (Signed)
Anesthesia Post Note  [name]  Procedure(s) Performed: ESOPHAGOGASTRODUODENOSCOPY (EGD) WITH PROPOFOL ESOPHAGEAL BANDING     Patient location during evaluation: PACU Anesthesia Type: General Level of consciousness: sedated and patient cooperative Pain management: pain level controlled Vital Signs Assessment: post-procedure vital signs reviewed and stable Respiratory status: spontaneous breathing Cardiovascular status: stable Anesthetic complications: yes Comments: Called to endo by CRNA for marginal SpO2 post procedure. SpO2 87% when I arrived. CRNA stated thick pulmonary secretions noted prior to extubation. Discussed with Dr. Tonia Brooms who notified Dr. Delton Coombes with PCCM. Pt did have some wheezing pre procedure. Given duoneb. SpO2 improved. Taken back to ICU, SpO2 96%.   Encounter Notable Events  Notable Event Outcome Phase Comment  Wheezing requiring medical treatment  Intraprocedure     Last Vitals:  Vitals:   04/08/23 2117 04/08/23 2130  BP:    Pulse:    Resp:    Temp:    SpO2: 92% (!) 88%    Last Pain:  Vitals:   04/08/23 1958  TempSrc: Oral  PainSc:                  Lewie Loron

## 2023-04-08 NOTE — Anesthesia Procedure Notes (Signed)
Procedure Name: Intubation Date/Time: 04/08/2023 3:07 PM  Performed by: De Nurse, CRNAPre-anesthesia Checklist: Patient identified, Emergency Drugs available, Suction available and Patient being monitored Patient Re-evaluated:Patient Re-evaluated prior to induction Oxygen Delivery Method: Circle System Utilized Preoxygenation: Pre-oxygenation with 100% oxygen Induction Type: IV induction, Rapid sequence and Cricoid Pressure applied Ventilation: Mask ventilation without difficulty Laryngoscope Size: Mac, 3 and Glidescope Grade View: Grade I Tube type: Oral Tube size: 7.5 mm Number of attempts: 1 Airway Equipment and Method: Stylet and Oral airway Placement Confirmation: ETT inserted through vocal cords under direct vision, positive ETCO2 and breath sounds checked- equal and bilateral Secured at: 22 cm Tube secured with: Tape Dental Injury: Teeth and Oropharynx as per pre-operative assessment

## 2023-04-08 NOTE — Progress Notes (Signed)
Potassium 2.7. currently being treated.

## 2023-04-08 NOTE — Transfer of Care (Signed)
Immediate Anesthesia Transfer of Care Note  [name]  Procedure(s) Performed: ESOPHAGOGASTRODUODENOSCOPY (EGD) WITH PROPOFOL ESOPHAGEAL BANDING  Patient Location: Endoscopy Unit  Anesthesia Type:General  Level of Consciousness: awake, alert , and oriented  Airway & Oxygen Therapy: Patient Spontanous Breathing and non-rebreather face mask  Post-op Assessment: Report given to RN  Post vital signs: Reviewed and unstable  Last Vitals:  Vitals Value Taken Time  BP 135/90   Temp    Pulse 88 04/08/23 1552  Resp 24 04/08/23 1552  SpO2 97 % 04/08/23 1552  Vitals shown include unvalidated device data.  Last Pain:  Vitals:   04/08/23 1428  TempSrc: Temporal  PainSc:       Patients Stated Pain Goal: 4 (04/08/23 1230)  Complications: No notable events documented.

## 2023-04-08 NOTE — Progress Notes (Signed)
NAME:  MELCHOR KIRCHGESSNER, MRN:  [id number], DOB:  06/14/1971, LOS: 1 ADMISSION DATE:  04/07/2023, CONSULTATION DATE:  04/07/2023 REFERRING MD:  Dr. Arnoldo Morale, CHIEF COMPLAINT:  Upper GI bleed    History of Present Illness:  LENIX KIDD is a 52 yo male with a PMH significant for recurrent GI bleed with known variceal bleed, ischemic cardiomyopathy, EtOH abuse, polysubstance abuse, HTN, HLD, depression, and anxiety who presented to [facility] ED 4/22 for acute GI bleed. Of note patient was seen within this health sytem 1 month ago for significant GI bleed requiring banding per GI with TIPS procedure recommended but patient declined. Patient reports last drink and cocaine use was day prior to admission.  On ED arrival patient was seen hypotensive and mildly tachycardic. Lab work significant for K 2.3, glucoses 140, Mg 1.2, alkaline phos 153, albumin 2.3, AST 44, total protein 5.6, total bili 1.6, lactic 5.6, hgb 7.1, and hct 23.2. Given active bleed with compromised hemodynamics, PCCM consulted for further management and admission.   Pertinent  Medical History  Recurrent GI bleed with known variceal bleed, ischemic cardiomyopathy, EtOH abuse, polysubstance abuse, HTN, HLD, depression, and anxiety   Significant Hospital Events: Including procedures, antibiotic start and stop dates in addition to other pertinent events   4/22 admitted with acute GI bleed   Interim History / Subjective:  Tx overnight from [place]. BP stable on low dose levophed  C/o mild abd pain, wants to eat  Hgb trending down  No hematemesis  Objective   Blood pressure 112/77, pulse 93, temperature 98.4 F (36.9 C), temperature source Oral, resp. rate 16, weight 91.6 kg, SpO2 94 %.        Intake/Output Summary (Last 24 hours) at 04/08/2023 0916 Last data filed at 04/08/2023 0800 Gross per 24 hour  Intake 2551.95 ml  Output 1000 ml  Net 1551.95 ml   Filed Weights   04/07/23 1919 04/08/23 0428  Weight: 89.4 kg 91.6 kg     Examination: General: Adult male, resting in bed, in NAD. Neuro: A&O x 3, no deficits. HEENT: [place]/AT. Sclerae anicteric. EOMI. Cardiovascular: RRR, no M/R/G.  Lungs: Respirations even and unlabored.  CTA bilaterally, No W/R/R. Abdomen: BS x 4, soft, NT/ND.  Musculoskeletal: No gross deformities, no edema.  Skin: Intact, warm, no rashes.  Assessment & Plan:   Hemorrhagic shock in the setting of Upper GI bleed with hematemesis and known varices  -Last upper endoscopy 03/01/23 "which showed 3 columns of esophageal varices (one with red wale sign) s/p EVL x2".  TIPS was recommended but pt declined. ABLA - 2/2 above P: Follow H/H, maintain hgb > 7 2 additional PRBC ordered for hgb 6.2  Continue home Midodrine Stress steroids in lieu of home Cortef (  daily) GI to see  D/w IR - likely still needs TIPS but IR recommends EGD first to ensure no PUD or actively bleeding varices IR to discuss with pt and POA re TIPS NPO  Maintain 2 large bore PIVs Continue Protonix and Octreotide drip  Hold any further anticoagulation or NSAIDs   Hx cirrhosis 2/2 EtOH P: Continue home lactulose Empiric CTX   Hypokalemia Hypomagnesemia   P: Supplement K and Mg Trend Bmet   Hx Systolic congestive heart failure (EF 50-55% with WMA 02/04/23), HoTN CAD, HTN, HLD P: Continuous telemetry  Strict intake and output Continue home Midodrine Stress steroids in lieu of home Cortef (  daily) Hold home Rosuvastatin, Spironolactone, Torsemide Monitor volume status closely with blood product volume  Hx adrenal insufficiency  P: Stress steroids in lieu of home Cortef (  daily)   Depression and anxiety  Hx EtOH abuse, Cocaine and Marijuana abuse P: CIWA protocol  Consider phenobarbital taper, hold off for now  Seizure precautions  Monitor for withdrawal  Hold home Gabapentin, Methocarbamol, Quetiapine Cessation counseling when able Note still actively drinking    Best Practice (right  click and "Reselect all SmartList Selections" daily)   Diet/type: NPO DVT prophylaxis: SCD GI prophylaxis: PPI Lines: Central line Foley:  N/A Code Status:  full code Last date of multidisciplinary goals of care discussion: 04/08/23  Labs   CBC: Recent Labs  Lab 04/07/23 1026 04/07/23 2110 04/08/23 0250  WBC 10.2 4.7 4.7  NEUTROABS 6.7  --   --   HGB 7.1* 7.0* 6.2*  HCT 23.2* 22.1* 19.2*  MCV 92.4 92.1 91.0  PLT 157 89* 82*    Basic Metabolic Panel: Recent Labs  Lab 04/07/23 1026 04/07/23 2110 04/08/23 0250  NA 141 141 141  K 2.3* 2.7* 3.4*  CL 102 103 107  CO2 GLUCOSE 140* 152* 161*  BUN CREATININE 0.74 0.73 0.67  CALCIUM 7.4* 7.3* 7.4*  MG 1.2*  --  2.1  PHOS  --   --  2.4*   GFR: Estimated Creatinine Clearance: 114.8 mL/min (by C-G formula based on SCr of 0.67 mg/dL). Recent Labs  Lab 04/07/23 1026 04/07/23 1346 04/07/23 2110 04/07/23 2208 04/08/23 0250  WBC 10.2  --  4.7  --  4.7  LATICACIDVEN 5.6* 3.7*  --  2.3*  --     Liver Function Tests: Recent Labs  Lab 04/07/23 1026 04/07/23 2110  AST 44* 47*  ALT 13 19  ALKPHOS 153* 144*  BILITOT 1.6* 2.6*  PROT 5.6* 5.6*  ALBUMIN 2.3* 2.3*   Recent Labs  Lab 04/07/23 1026  LIPASE 38   No results for input(s): "AMMONIA" in the last 168 hours.  ABG    Component Value Date/Time   HCO3 29.7 (H) 02/22/2023 2132   O2SAT 69.3 02/22/2023 2132     Coagulation Profile: Recent Labs  Lab 04/07/23 1026  INR 1.6*    Cardiac Enzymes: No results for input(s): "CKTOTAL", "CKMB", "CKMBINDEX", "TROPONINI" in the last 168 hours.  HbA1C: Hgb A1c MFr Bld  Date/Time Value Ref Range Status  02/22/2023 04:50 AM 5.6 4.8 - 5.6 % Final    Comment:    (NOTE)         Prediabetes: 5.7 - 6.4         Diabetes: >6.4         Glycemic control for adults with diabetes: <7.0   10/01/2022 01:37 AM 4.7 (L) 4.8 - 5.6 % Final    Comment:    (NOTE) Pre diabetes:           5.7%-6.4%  Diabetes:              >6.4%  Glycemic control for   <7.0% adults with diabetes     CBG: Recent Labs  Lab 04/07/23 1919 04/07/23 2339 04/08/23 0350 04/08/23 0725  GLUCAP 144* 140* 154* 165*     Critical care time: 32 min.   Dirk Dress, NP Pulmonary/Critical Care Medicine  04/08/2023  9:16 AM

## 2023-04-08 NOTE — Progress Notes (Addendum)
eLink Physician-Brief Progress Note Patient Name: Ethan Wells DOB: May 03, 1971 MRN: [id number]   Date of Service  04/08/2023  HPI/Events of Note  52 year old male that was admitted for acute upper GI bleed after recent admission for GI bleed 1 month prior.  He has cirrhosis which is advanced complicated by portal hypertension and during his last admission he developed pulmonary edema secondary to blood products.  Called to bedside to evaluate for respiratory distress.  Since returning from his EGD earlier today, the patient has been needing persistent oxygenation which is new.  He has been having intermittent vomiting and developed progressive respiratory distress.  Exam consistent with bilateral breath sounds, tripoding, and ongoing encephalopathy.   Plan saturation could be established, the patient was noted to be in the 60s.  Immediately, bedside team was called and available.  The emergently intubated the patient.  He had copious red-tinged fluid coming from the ET tube.  eICU Interventions  Stabilized on the ventilator, concern for TACO.  Will initiate Lasix 40 mg IV once.  Sedated postop intubation with Precedex and Dilaudid drip.  Maintain octreotide and PPI.  One-time Versed and Dilaudid pushes before initiating drips.  Chest radiograph with bilateral edema without evidence of pneumothorax.  Tubes and lines in good position.  Will obtain ABG in roughly 30 minutes.   2200 -PF ratio of 70s despite Lasix, insert Foley, plan for prone positioning.  Currently on a PEEP of 12 with 100% FiO2.  Peak pressures of 32.  Intervention Category Major Interventions: Respiratory failure - evaluation and management  [name] 04/08/2023, 8:58 PM

## 2023-04-08 NOTE — Anesthesia Preprocedure Evaluation (Addendum)
Anesthesia Evaluation  Patient identified by MRN, date of birth, ID band  Reviewed: Allergy & Precautions, NPO status , Patient's Chart, lab work & pertinent test results  History of Anesthesia Complications Negative for: history of anesthetic complications  Airway Mallampati: IV  TM Distance: >3 FB Neck ROM: full  Mouth opening: Limited Mouth Opening  Dental  (+) Poor Dentition, Edentulous Upper, Chipped, Missing   Pulmonary Current Smoker and Patient abstained from smoking.    + wheezing      Cardiovascular hypertension, + CAD, + Past MI, + Peripheral Vascular Disease and +CHF   Rhythm:Regular Rate:Normal - Systolic murmurs Echo 01/2023  1. Left ventricular ejection fraction, by estimation, is 50 to 55%. The left ventricle has low normal function. The left ventricle has no regional wall motion abnormalities. Left ventricular diastolic parameters are indeterminate.   2. Right ventricular systolic function is normal. The right ventricular size is normal.   3. The mitral valve is normal in structure. Mild mitral valve regurgitation. No evidence of mitral stenosis.   4. The aortic valve is normal in structure. Aortic valve regurgitation is not visualized. No aortic stenosis is present.   5. The inferior vena cava is normal in size with greater than 50% respiratory variability, suggesting right atrial pressure of 3 mmHg.     Neuro/Psych  Headaches PSYCHIATRIC DISORDERS Anxiety Depression       GI/Hepatic ,GERD  Medicated,,(+) Cirrhosis     substance abuse  alcohol use, cocaine use and marijuana use  Endo/Other  negative endocrine ROS    Renal/GU      Musculoskeletal   Abdominal   Peds  Hematology  (+) Blood dyscrasia, anemia   Anesthesia Other Findings Past Medical History: No date: CAD (coronary artery disease)     Comment:  a. 01/2011 Anterior STEMI/Cath/PCI: LM nl, LAD 100d               (3.5x59mm Vision BMS placed),  LCX 35m, RI 50, RCA min               irregs, EF 40% w/ apical, inferoapical HK. No date: Cardiac arrest - ventricular fibrillation     Comment:  a. In setting of STEMI 01/2011 No date: Chronic pain No date: Cocaine abuse (HCC) No date: Depression with anxiety No date: ETOH abuse     Comment:  a. 6-12 beers/day No date: GERD (gastroesophageal reflux disease) No date: Hemorrhoids No date: Hyperlipidemia No date: Hypertension No date: Ischemic cardiomyopathy     Comment:  a. 06/2011 Echo: EF 45-50%, No rwma No date: Marijuana abuse No date: Migraines No date: Tobacco abuse     Comment:  a. 1/2 ppd x 26 yrs  Past Surgical History: 08/09/2021: COLONOSCOPY     Comment:  Procedure: COLONOSCOPY;  Surgeon: Midge Minium, MD;                Location: [facility] ENDOSCOPY;  Service: Endoscopy;; No date: CORONARY ANGIOPLASTY WITH STENT PLACEMENT 05/30/2022: ESOPHAGOGASTRODUODENOSCOPY; N/A     Comment:  Procedure: ESOPHAGOGASTRODUODENOSCOPY (EGD);  Surgeon:               Toney Reil, MD;  Location: [facility] ENDOSCOPY;                Service: Gastroenterology;  Laterality: N/A; 03/01/2021: ESOPHAGOGASTRODUODENOSCOPY (EGD) WITH PROPOFOL; N/A     Comment:  Procedure: ESOPHAGOGASTRODUODENOSCOPY (EGD) WITH               PROPOFOL;  Surgeon: Lannette Donath  Betti Cruz, MD;  Location:               [facility];  Service: Endoscopy;  Laterality:               N/A; 08/09/2021: ESOPHAGOGASTRODUODENOSCOPY (EGD) WITH PROPOFOL; N/A     Comment:  Procedure: ESOPHAGOGASTRODUODENOSCOPY (EGD) WITH               PROPOFOL;  Surgeon: Midge Minium, MD;  Location: [facility]               ENDOSCOPY;  Service: Endoscopy;  Laterality: N/A; 10/02/2022: FLEXIBLE SIGMOIDOSCOPY; N/A     Comment:  Procedure: FLEXIBLE SIGMOIDOSCOPY;  Surgeon: Meryl Dare, MD;  Location: [facility] ENDOSCOPY;  Service:               Gastroenterology;  Laterality: N/A; 10/24/2022: VISCERAL ANGIOGRAPHY; N/A     Comment:  Procedure:  VISCERAL ANGIOGRAPHY;  Surgeon: Annice Needy,              MD;  Location: [facility] INVASIVE CV LAB;  Service:               Cardiovascular;  Laterality: N/A;  BMI    Body Mass Index: 31.00 kg/m      Reproductive/Obstetrics negative OB ROS                             Anesthesia Physical Anesthesia Plan  ASA: 3  Anesthesia Plan: General   Post-op Pain Management: Minimal or no pain anticipated   Induction: Intravenous, Rapid sequence and Cricoid pressure planned  PONV Risk Score and Plan: 1 and Ondansetron, Treatment may vary due to age or medical condition and Dexamethasone  Airway Management Planned: Oral ETT and Video Laryngoscope Planned  Additional Equipment: None  Intra-op Plan:   Post-operative Plan: Possible Post-op intubation/ventilation  Informed Consent: I have reviewed the patients History and Physical, chart, labs and discussed the procedure including the risks, benefits and alternatives for the proposed anesthesia with the patient or authorized representative who has indicated his/her understanding and acceptance.     Dental advisory given  Plan Discussed with: CRNA  Anesthesia Plan Comments:         Anesthesia Quick Evaluation

## 2023-04-08 NOTE — Progress Notes (Deleted)
Wound care completed per WOC notes  

## 2023-04-08 NOTE — Op Note (Signed)
[facility] Patient Name: Ethan Wells Procedure Date : 04/08/2023 MRN: [id number] Attending MD: Tressia Danas MD, MD, [id number] Date of Birth: [birth year] CSN: [id number] Age: 52 Admit Type: Inpatient Procedure:                Upper GI endoscopy Indications:              Hematemesis Providers:                Tressia Danas MD, MD, Geralyn Corwin, RN,                            Marja Kays, Technician Referring MD:             Corena Herter Medicines:                General Anesthesia Complications:            No immediate complications. Estimated Blood Loss:     Estimated blood loss: none. Procedure:                Pre-Anesthesia Assessment:                           - Prior to the procedure, a History and Physical                            was performed, and patient medications and                            allergies were reviewed. The patient's tolerance of                            previous anesthesia was also reviewed. The risks                            and benefits of the procedure and the sedation                            options and risks were discussed with the patient.                            All questions were answered, and informed consent                            was obtained. Prior Anticoagulants: The patient has                            taken no anticoagulant or antiplatelet agents. ASA                            Grade Assessment: III - A patient with severe                            systemic disease. After reviewing the risks and  benefits, the patient was deemed in satisfactory                            condition to undergo the procedure.                           After obtaining informed consent, the endoscope was                            passed under direct vision. Throughout the                            procedure, the patient's blood pressure, pulse, and                            oxygen  saturations were monitored continuously. The                            [id number]-[id number] ([id number]) Olympus endoscope was introduced                            through the mouth, and advanced to the second part                            of duodenum. The upper GI endoscopy was                            accomplished without difficulty. The patient                            tolerated the procedure well. Scope In: Scope Out: Findings:      Salmon-colored mucosa was present consistent with known diagnosis of       Barrett's Esophagus. C0-M3 per Prague criteria. Maximum logitudinal       length was 3 cm and the Barrett's overlies the most distal esophageal       varix.      Two columns of grade II varices with stigmata of recent bleeding were       found in the mid esophagus. A small red wale signs was present. Scarring       from prior treatment was also visible. Two bands were successfully       placed with deflation of varices. Bleeding occurred with the placement       of the first band but had stopped at the end of the maneuver.      Moderate portal hypertensive gastropathy was found in the gastric body.      Very early Type 1 isolated gastric varices (IGV1, varices located in the       fundus) with no bleeding were found in the gastric fundus. There were no       stigmata of recent bleeding.      A small hiatal hernia was present. No blood present in the stomach.      The examined duodenum was normal. No blood present in the stomach. Impression:               - Known Barrett's esophagus.                           -  Grade II esophageal varices with stigmata of                            recent bleeding. Banded x 2.                           - Portal hypertensive gastropathy.                           - Early, non bleeding gastric varices. These are                            not contributing to the bleeding.                           - Small hiatal hernia.                           - No fresh  blood seen during the procedure. Recommendation:           - Return patient to hospital ward for ongoing care.                           - NPO. Sips of clears later today.                           - Continue present medications including                            pantoprazole IV BID and continuous infusion                            octreotide.                           - Serial hgb/hct with transfusion as indicated.                           - Complete 5 days of empiric antibiotics.                           - Repeat EGD in 3 weeks for additional banding.                           - Consider TIPS given his recurrent bleeding if the                            benefits outweight the risks.                           - Doppler ultrasound to exclude portal vein                            thrombus. Procedure Code(s):        --- Professional ---  43244, Esophagogastroduodenoscopy, flexible,                            transoral; with band ligation of esophageal/gastric                            varices Diagnosis Code(s):        --- Professional ---                           K22.89, Other specified disease of esophagus                           I85.01, Esophageal varices with bleeding                           K76.6, Portal hypertension                           K31.89, Other diseases of stomach and duodenum                           I86.4, Gastric varices                           K44.9, Diaphragmatic hernia without obstruction or                            gangrene                           K92.0, Hematemesis CPT copyright 2022 American [employer] Association. All rights reserved. The codes documented in this report are preliminary and upon coder review may  be revised to meet current compliance requirements. Tressia Danas MD, MD 04/08/2023 4:15:55 PM This report has been signed electronically. Number of Addenda: 0

## 2023-04-08 NOTE — Progress Notes (Signed)
Called to bedside for respiratory distress, saturating in 60s when I arrived, air hungry and agitated. Sats improved with BVM but unable to get out of the 70s. Coarse rhales bilaterally, symmetric breath sounds. Intubated after suctioning thick secretions from oropharynx. Sats quickly improved. No NGT since varices banded today. D/w Elink physician.  Steffanie Dunn, DO 04/08/23 9:05 PM [facility]  For contact information, see Amion. If no response to pager, please call PCCM consult pager. After hours, 7PM- 7AM, please call Elink.

## 2023-04-09 ENCOUNTER — Inpatient Hospital Stay (HOSPITAL_COMMUNITY): Payer: Medicare HMO

## 2023-04-09 ENCOUNTER — Encounter (HOSPITAL_COMMUNITY): Payer: Self-pay | Admitting: Pulmonary Disease

## 2023-04-09 DIAGNOSIS — G9341 Metabolic encephalopathy: Secondary | ICD-10-CM | POA: Diagnosis not present

## 2023-04-09 DIAGNOSIS — K7030 Alcoholic cirrhosis of liver without ascites: Secondary | ICD-10-CM

## 2023-04-09 DIAGNOSIS — K7682 Hepatic encephalopathy: Secondary | ICD-10-CM | POA: Insufficient documentation

## 2023-04-09 DIAGNOSIS — I8501 Esophageal varices with bleeding: Secondary | ICD-10-CM | POA: Diagnosis not present

## 2023-04-09 DIAGNOSIS — D649 Anemia, unspecified: Secondary | ICD-10-CM | POA: Diagnosis not present

## 2023-04-09 DIAGNOSIS — K209 Esophagitis, unspecified without bleeding: Secondary | ICD-10-CM | POA: Diagnosis not present

## 2023-04-09 DIAGNOSIS — I5020 Unspecified systolic (congestive) heart failure: Secondary | ICD-10-CM

## 2023-04-09 DIAGNOSIS — I8511 Secondary esophageal varices with bleeding: Secondary | ICD-10-CM

## 2023-04-09 DIAGNOSIS — D689 Coagulation defect, unspecified: Secondary | ICD-10-CM

## 2023-04-09 DIAGNOSIS — K920 Hematemesis: Secondary | ICD-10-CM | POA: Diagnosis not present

## 2023-04-09 LAB — POCT I-STAT 7, (LYTES, BLD GAS, ICA,H+H)
Acid-Base Excess: 1 mmol/L (ref 0.0–2.0)
Acid-Base Excess: 2 mmol/L (ref 0.0–2.0)
Acid-Base Excess: 2 mmol/L (ref 0.0–2.0)
Bicarbonate: 25.3 mmol/L (ref 20.0–28.0)
Bicarbonate: 24.7 mmol/L (ref 20.0–28.0)
Bicarbonate: 27.1 mmol/L (ref 20.0–28.0)
Calcium, Ion: 1.11 mmol/L — ABNORMAL LOW (ref 1.15–1.40)
Calcium, Ion: 1.1 mmol/L — ABNORMAL LOW (ref 1.15–1.40)
Calcium, Ion: 1.14 mmol/L — ABNORMAL LOW (ref 1.15–1.40)
HCT: 32 % — ABNORMAL LOW (ref 39.0–52.0)
HCT: 30 % — ABNORMAL LOW (ref 39.0–52.0)
HCT: 32 % — ABNORMAL LOW (ref 39.0–52.0)
Hemoglobin: 10.9 g/dL — ABNORMAL LOW (ref 13.0–17.0)
Hemoglobin: 10.2 g/dL — ABNORMAL LOW (ref 13.0–17.0)
Hemoglobin: 10.9 g/dL — ABNORMAL LOW (ref 13.0–17.0)
O2 Saturation: 97 %
O2 Saturation: 100 %
O2 Saturation: 99 %
Patient temperature: 96.4
Patient temperature: 96.3
Patient temperature: 97.6
Potassium: 3.1 mmol/L — ABNORMAL LOW (ref 3.5–5.1)
Potassium: 3.1 mmol/L — ABNORMAL LOW (ref 3.5–5.1)
Potassium: 2.9 mmol/L — ABNORMAL LOW (ref 3.5–5.1)
Sodium: 144 mmol/L (ref 135–145)
Sodium: 144 mmol/L (ref 135–145)
Sodium: 144 mmol/L (ref 135–145)
TCO2: 26 mmol/L (ref 22–32)
TCO2: 26 mmol/L (ref 22–32)
TCO2: 28 mmol/L (ref 22–32)
pCO2 arterial: 37.6 mmHg (ref 32–48)
pCO2 arterial: 30.7 mmHg — ABNORMAL LOW (ref 32–48)
pCO2 arterial: 41.4 mmHg (ref 32–48)
pH, Arterial: 7.431 (ref 7.35–7.45)
pH, Arterial: 7.508 — ABNORMAL HIGH (ref 7.35–7.45)
pH, Arterial: 7.421 (ref 7.35–7.45)
pO2, Arterial: 83 mmHg (ref 83–108)
pO2, Arterial: 262 mmHg — ABNORMAL HIGH (ref 83–108)
pO2, Arterial: 153 mmHg — ABNORMAL HIGH (ref 83–108)

## 2023-04-09 LAB — CBC
HCT: 30.2 % — ABNORMAL LOW (ref 39.0–52.0)
HCT: 32.3 % — ABNORMAL LOW (ref 39.0–52.0)
HCT: 31 % — ABNORMAL LOW (ref 39.0–52.0)
Hemoglobin: 10.5 g/dL — ABNORMAL LOW (ref 13.0–17.0)
Hemoglobin: 11 g/dL — ABNORMAL LOW (ref 13.0–17.0)
Hemoglobin: 10.5 g/dL — ABNORMAL LOW (ref 13.0–17.0)
MCH: 30.2 pg (ref 26.0–34.0)
MCH: 29.9 pg (ref 26.0–34.0)
MCH: 29.7 pg (ref 26.0–34.0)
MCHC: 34.8 g/dL (ref 30.0–36.0)
MCHC: 34.1 g/dL (ref 30.0–36.0)
MCHC: 33.9 g/dL (ref 30.0–36.0)
MCV: 86.8 fL (ref 80.0–100.0)
MCV: 87.8 fL (ref 80.0–100.0)
MCV: 87.6 fL (ref 80.0–100.0)
Platelets: 100 10*3/uL — ABNORMAL LOW (ref 150–400)
Platelets: 121 10*3/uL — ABNORMAL LOW (ref 150–400)
Platelets: 114 10*3/uL — ABNORMAL LOW (ref 150–400)
RBC: 3.48 MIL/uL — ABNORMAL LOW (ref 4.22–5.81)
RBC: 3.68 MIL/uL — ABNORMAL LOW (ref 4.22–5.81)
RBC: 3.54 MIL/uL — ABNORMAL LOW (ref 4.22–5.81)
RDW: 17.7 % — ABNORMAL HIGH (ref 11.5–15.5)
RDW: 17.8 % — ABNORMAL HIGH (ref 11.5–15.5)
RDW: 17.6 % — ABNORMAL HIGH (ref 11.5–15.5)
WBC: 10.3 10*3/uL (ref 4.0–10.5)
WBC: 13.7 10*3/uL — ABNORMAL HIGH (ref 4.0–10.5)
WBC: 15.1 10*3/uL — ABNORMAL HIGH (ref 4.0–10.5)
nRBC: 0.2 % (ref 0.0–0.2)
nRBC: 0 % (ref 0.0–0.2)
nRBC: 0 % (ref 0.0–0.2)

## 2023-04-09 LAB — TYPE AND SCREEN
ABO/RH(D): A POS
Antibody Screen: NEGATIVE
Unit division: 0
Unit division: 0
Unit division: 0

## 2023-04-09 LAB — COMPREHENSIVE METABOLIC PANEL WITH GFR
ALT: 17 U/L (ref 0–44)
AST: 37 U/L (ref 15–41)
Albumin: 2.3 g/dL — ABNORMAL LOW (ref 3.5–5.0)
Alkaline Phosphatase: 131 U/L — ABNORMAL HIGH (ref 38–126)
Anion gap: 11 (ref 5–15)
BUN: 16 mg/dL (ref 6–20)
CO2: 24 mmol/L (ref 22–32)
Calcium: 7.5 mg/dL — ABNORMAL LOW (ref 8.9–10.3)
Chloride: 106 mmol/L (ref 98–111)
Creatinine, Ser: 0.91 mg/dL (ref 0.61–1.24)
GFR, Estimated: >60 mL/min
Glucose, Bld: 174 mg/dL — ABNORMAL HIGH (ref 70–99)
Potassium: 3.1 mmol/L — ABNORMAL LOW (ref 3.5–5.1)
Sodium: 141 mmol/L (ref 135–145)
Total Bilirubin: 2.5 mg/dL — ABNORMAL HIGH (ref 0.3–1.2)
Total Protein: 5.6 g/dL — ABNORMAL LOW (ref 6.5–8.1)

## 2023-04-09 LAB — GLUCOSE, CAPILLARY
Glucose-Capillary: 172 mg/dL — ABNORMAL HIGH (ref 70–99)
Glucose-Capillary: 164 mg/dL — ABNORMAL HIGH (ref 70–99)
Glucose-Capillary: 153 mg/dL — ABNORMAL HIGH (ref 70–99)
Glucose-Capillary: 133 mg/dL — ABNORMAL HIGH (ref 70–99)
Glucose-Capillary: 117 mg/dL — ABNORMAL HIGH (ref 70–99)
Glucose-Capillary: 153 mg/dL — ABNORMAL HIGH (ref 70–99)

## 2023-04-09 LAB — BPAM RBC
Blood Product Expiration Date: 202405102359
Blood Product Expiration Date: 202405152359
ISSUE DATE / TIME: 202404230313
ISSUE DATE / TIME: 202404231001
ISSUE DATE / TIME: 202404231231
Unit Type and Rh: 6200
Unit Type and Rh: 6200
Unit Type and Rh: 6200

## 2023-04-09 LAB — AMMONIA: Ammonia: 75 umol/L — ABNORMAL HIGH (ref 9–35)

## 2023-04-09 LAB — TRIGLYCERIDES: Triglycerides: 165 mg/dL — ABNORMAL HIGH (ref <150)

## 2023-04-09 MED ORDER — FOLIC ACID 5 MG/ML IJ SOLN
1.0000 mg | Freq: Every day | INTRAMUSCULAR | Status: DC
  Administered 2023-04-09 – 2023-04-13 (×5): 1 mg via INTRAVENOUS
  Filled 2023-04-09 (×6): qty 0.2

## 2023-04-09 MED ORDER — POTASSIUM CHLORIDE 10 MEQ/50ML IV SOLN
10.0000 meq | INTRAVENOUS | Status: AC
  Administered 2023-04-09 (×3): 10 meq via INTRAVENOUS

## 2023-04-09 MED ORDER — POTASSIUM CHLORIDE 10 MEQ/50ML IV SOLN
INTRAVENOUS | Status: AC
  Filled 2023-04-09: qty 50

## 2023-04-09 MED ORDER — POTASSIUM CHLORIDE 10 MEQ/50ML IV SOLN
10.0000 meq | INTRAVENOUS | Status: DC
  Administered 2023-04-09 (×3): 10 meq via INTRAVENOUS
  Filled 2023-04-09 (×7): qty 50

## 2023-04-09 MED ORDER — FENTANYL CITRATE PF 50 MCG/ML IJ SOSY
50.0000 ug | PREFILLED_SYRINGE | INTRAMUSCULAR | Status: DC | PRN
  Administered 2023-04-09: 50 ug via INTRAVENOUS
  Filled 2023-04-09 (×2): qty 1

## 2023-04-09 MED ORDER — ORAL CARE MOUTH RINSE: 15.0000 mL | OROMUCOSAL | Status: DC | PRN

## 2023-04-09 MED ORDER — MIDAZOLAM-SODIUM CHLORIDE 100-0.9 MG/100ML-% IV SOLN: 0.5000 mg/h | INTRAVENOUS | Status: DC

## 2023-04-09 MED ORDER — CALCIUM GLUCONATE-NACL 1-0.675 GM/50ML-% IV SOLN
1.0000 g | Freq: Once | INTRAVENOUS | Status: AC
  Administered 2023-04-09: 1000 mg via INTRAVENOUS
  Filled 2023-04-09: qty 50

## 2023-04-09 MED ORDER — FUROSEMIDE 10 MG/ML IJ SOLN
40.0000 mg | Freq: Four times a day (QID) | INTRAMUSCULAR | Status: AC
  Administered 2023-04-09 (×3): 40 mg via INTRAVENOUS
  Filled 2023-04-09 (×2): qty 4

## 2023-04-09 MED ORDER — POTASSIUM CHLORIDE 10 MEQ/50ML IV SOLN
10.0000 meq | INTRAVENOUS | Status: AC
  Administered 2023-04-09 – 2023-04-10 (×8): 10 meq via INTRAVENOUS
  Filled 2023-04-09 (×8): qty 50

## 2023-04-09 MED ORDER — INSULIN ASPART 100 UNIT/ML IJ SOLN
0.0000 [IU] | INTRAMUSCULAR | Status: DC
  Administered 2023-04-09 (×4): 3 [IU] via SUBCUTANEOUS
  Administered 2023-04-09 – 2023-04-11 (×6): 2 [IU] via SUBCUTANEOUS
  Administered 2023-04-11: 3 [IU] via SUBCUTANEOUS
  Administered 2023-04-12: 2 [IU] via SUBCUTANEOUS
  Administered 2023-04-12: 3 [IU] via SUBCUTANEOUS
  Administered 2023-04-12: 2 [IU] via SUBCUTANEOUS
  Administered 2023-04-13: 3 [IU] via SUBCUTANEOUS
  Administered 2023-04-13: 2 [IU] via SUBCUTANEOUS
  Administered 2023-04-13: 3 [IU] via SUBCUTANEOUS
  Administered 2023-04-14: 2 [IU] via SUBCUTANEOUS
  Administered 2023-04-14: 3 [IU] via SUBCUTANEOUS
  Administered 2023-04-14 – 2023-04-15 (×3): 2 [IU] via SUBCUTANEOUS
  Administered 2023-04-15: 3 [IU] via SUBCUTANEOUS
  Administered 2023-04-15: 2 [IU] via SUBCUTANEOUS
  Administered 2023-04-15: 3 [IU] via SUBCUTANEOUS
  Administered 2023-04-15 – 2023-04-16 (×2): 2 [IU] via SUBCUTANEOUS
  Administered 2023-04-16 (×2): 3 [IU] via SUBCUTANEOUS
  Administered 2023-04-17: 2 [IU] via SUBCUTANEOUS
  Administered 2023-04-17: 3 [IU] via SUBCUTANEOUS
  Administered 2023-04-18: 2 [IU] via SUBCUTANEOUS
  Administered 2023-04-18: 3 [IU] via SUBCUTANEOUS
  Administered 2023-04-18: 2 [IU] via SUBCUTANEOUS
  Administered 2023-04-18: 3 [IU] via SUBCUTANEOUS
  Administered 2023-04-18: 2 [IU] via SUBCUTANEOUS
  Administered 2023-04-19 (×2): 3 [IU] via SUBCUTANEOUS
  Administered 2023-04-19 (×2): 2 [IU] via SUBCUTANEOUS
  Administered 2023-04-19: 3 [IU] via SUBCUTANEOUS
  Administered 2023-04-20 (×2): 2 [IU] via SUBCUTANEOUS
  Administered 2023-04-20: 3 [IU] via SUBCUTANEOUS
  Administered 2023-04-20 (×2): 2 [IU] via SUBCUTANEOUS
  Administered 2023-04-20: 3 [IU] via SUBCUTANEOUS
  Administered 2023-04-21 (×2): 2 [IU] via SUBCUTANEOUS
  Administered 2023-04-21: 5 [IU] via SUBCUTANEOUS
  Administered 2023-04-21: 2 [IU] via SUBCUTANEOUS
  Administered 2023-04-21 (×2): 5 [IU] via SUBCUTANEOUS
  Administered 2023-04-21 – 2023-04-22 (×3): 3 [IU] via SUBCUTANEOUS
  Administered 2023-04-22 (×2): 5 [IU] via SUBCUTANEOUS
  Administered 2023-04-22 – 2023-04-23 (×4): 3 [IU] via SUBCUTANEOUS
  Administered 2023-04-23: 2 [IU] via SUBCUTANEOUS
  Administered 2023-04-23: 5 [IU] via SUBCUTANEOUS
  Administered 2023-04-23 (×2): 3 [IU] via SUBCUTANEOUS
  Administered 2023-04-24: 5 [IU] via SUBCUTANEOUS
  Administered 2023-04-24: 2 [IU] via SUBCUTANEOUS
  Administered 2023-04-24: 5 [IU] via SUBCUTANEOUS
  Administered 2023-04-24 – 2023-04-25 (×3): 3 [IU] via SUBCUTANEOUS
  Administered 2023-04-25 (×2): 5 [IU] via SUBCUTANEOUS
  Administered 2023-04-25: 3 [IU] via SUBCUTANEOUS
  Administered 2023-04-25: 2 [IU] via SUBCUTANEOUS
  Administered 2023-04-25 – 2023-04-26 (×3): 3 [IU] via SUBCUTANEOUS

## 2023-04-09 MED ORDER — ORAL CARE MOUTH RINSE
15.0000 mL | OROMUCOSAL | Status: DC
  Administered 2023-04-09 – 2023-04-10 (×15): 15 mL via OROMUCOSAL

## 2023-04-09 NOTE — Progress Notes (Signed)
Pharmacy Electrolyte Replacement  Recent Labs:  Recent Labs    04/08/23 0250 04/08/23 1359 04/09/23 0533 04/09/23 1230  K 3.4*   < > 3.1* 2.9*  MG 2.1  --   --   --   PHOS 2.4*  --   --   --   CREATININE 0.67   < > 0.91  --    < > = values in this interval not displayed.    Low Critical Values (K </= 2.5, Phos </= 1, Mg </= 1) Present: None  MD Contacted: Smith  Plan: KCl IV x8  Recheck K in AM

## 2023-04-09 NOTE — Progress Notes (Addendum)
Progress Note  Primary GI: Dr. Allegra Lai  LOS: 2 days   Chief Complaint: GI bleed   Subjective   Patient is intubated and sedated in the prone position. None responsive to verbal stimuli. Soft mitten restraints noted.  Overnight patient experienced respiratory distress, saturating in the 60s.  Patient was subsequently intubated after suctioning thick secretions from oropharynx   Objective   Vital signs in last 24 hours: Temp:  [96.3 F (35.7 C)-98.9 F (37.2 C)] 97.6 F (36.4 C) (04/24 0800) Pulse Rate:  [55-154] 66 (04/24 1200) Resp:  [0-33] 13 (04/24 1200) BP: (83-198)/(52-122) 117/82 (04/24 1200) SpO2:  [64 %-100 %] 99 % (04/24 1200) FiO2 (%):  [60 %-100 %] 60 % (04/24 1100) Weight:  [91.6 kg] 91.6 kg (04/24 0700) Last BM Date : 04/08/23 Last BM recorded by nurses in past 5 days Stool Type: Type 6 (Mushy consistency with ragged edges) (04/08/2023  8:56 PM)  General:   male intubated and sedated Heart:  unable to evaluate with prone position Pulm: posterior ventilated breath sounds Abdomen: unable to evaluate with prone position Extremities:  pedal edema, SCDs Neurologic:  non-responsive to verbal stimuli  Intake/Output from previous day: 04/23 0701 - 04/24 0700 In: 3308.1 [I.V.:2226.4; Blood:551; IV Piggyback:530.6] Out: 3100 [Urine:3100] Intake/Output this shift: Total I/O In: 534.4 [I.V.:302.3; IV Piggyback:232] Out: 1000 [Urine:1000]  Studies/Results: DG Chest Port 1 View  Result Date: 04/09/2023 CLINICAL DATA:  Hypoxia EXAM: PORTABLE CHEST 1 VIEW COMPARISON:  04/09/2023 FINDINGS: Endotracheal tube is again noted and stable. Left jugular central line are again seen in satisfactory position. Significant improvement in the degree of congestive failure is noted. Some residual basilar opacities are seen. No bony abnormality is noted. IMPRESSION: Near complete resolution of previously seen pulmonary edema. Electronically Signed   By: Alcide Clever M.D.   On: 04/09/2023  12:12   DG CHEST PORT 1 VIEW  Result Date: 04/09/2023 CLINICAL DATA:  Respiratory failure, prone repositioning EXAM: PORTABLE CHEST 1 VIEW COMPARISON:  04/08/2023 FINDINGS: Endotracheal tube is seen 14 mm above the carina. Left internal jugular central venous catheter tip noted overlying the expected superior vena cava. Pulmonary insufflation is stable. Superimposed extensive bilateral perihilar and lower lung zone consolidation in keeping with severe alveolar pulmonary edema or diffuse pneumonic infiltrate has progressed in the interval since prior examination. No pneumothorax or pleural effusion. Cardiac size within normal limits. IMPRESSION: 1. Endotracheal tube 14 mm above the carina. 2. Progressive bilateral perihilar and lower lung zone consolidation, severe alveolar pulmonary edema or diffuse pneumonic infiltrate. Electronically Signed   By: Helyn Numbers M.D.   On: 04/09/2023 03:01   DG CHEST PORT 1 VIEW  Result Date: 04/08/2023 CLINICAL DATA:  Hypoxia EXAM: PORTABLE CHEST 1 VIEW COMPARISON:  04/07/2023 FINDINGS: The endotracheal tube has been placed with tip measuring 3.8 cm above the carina. Left central venous catheter with tip over the low SVC region. No pneumothorax. Cardiac enlargement with bilateral perihilar infiltrates, progressing since prior study, likely edema. No pleural effusions. No pneumothorax. Mediastinal contours appear intact. IMPRESSION: 1. Appliances appear in satisfactory position. 2. Cardiac enlargement with perihilar infiltration increasing since prior study, likely edema. Electronically Signed   By: Burman Nieves M.D.   On: 04/08/2023 22:04    Lab Results: Recent Labs    04/08/23 1400 04/08/23 2158 04/08/23 2215 04/08/23 2324 04/09/23 0138 04/09/23 0453 04/09/23 0533  WBC 10.5  --  19.7*  --   --   --  10.3  HGB 10.4*   < >  13.0   < > 10.9* 10.2* 10.5*  HCT 30.5*   < > 39.6   < > 32.0* 30.0* 30.2*  PLT 99*  --  171  --   --   --  100*   < > = values in  this interval not displayed.   BMET Recent Labs    04/08/23 0250 04/08/23 1359 04/08/23 2158 04/08/23 2215 04/08/23 2324 04/09/23 0138 04/09/23 0453 04/09/23 0533  NA 141 143   < > 141   < > 144 144 141  K 3.4* 3.4*   < > 2.8*   < > 3.1* 3.1* 3.1*  CL 107 105  --  102  --   --   --  106  CO2 28  --   --  24  --   --   --  24  GLUCOSE 161* 143*  --  212*  --   --   --  174*  BUN 11 13  --  14  --   --   --  16  CREATININE 0.67 0.50*  --  0.82  --   --   --  0.91  CALCIUM 7.4*  --   --  8.2*  --   --   --  7.5*   < > = values in this interval not displayed.   LFT Recent Labs    04/09/23 0533  PROT 5.6*  ALBUMIN 2.3*  AST 37  ALT 17  ALKPHOS 131*  BILITOT 2.5*   PT/INR Recent Labs    04/07/23 1026  LABPROT 18.9*  INR 1.6*     Scheduled Meds:  Chlorhexidine Gluconate Cloth  6 each Topical Daily   folic acid  1 mg Intravenous Daily   hydrocortisone sod succinate (SOLU-CORTEF) inj  100 mg Intravenous Q12H   insulin aspart  0-15 Units Subcutaneous Q4H   lactulose  30 g Oral BID   midodrine  10 mg Oral TID WC   multivitamin with minerals  1 tablet Oral Daily   mouth rinse  15 mL Mouth Rinse Q2H   [START ON 04/11/2023] pantoprazole  40 mg Intravenous Q12H   thiamine  100 mg Oral Daily   Or   thiamine  100 mg Intravenous Daily   Continuous Infusions:  cefTRIAXone (ROCEPHIN)  IV Stopped (04/09/23 1045)   dexmedetomidine (PRECEDEX) IV infusion Stopped (04/09/23 1009)   HYDROmorphone 1 mg/hr (04/09/23 1200)   lactated ringers Stopped (04/08/23 2051)   midazolam     norepinephrine (LEVOPHED) Adult infusion 1 mcg/min (04/09/23 1200)   octreotide (SANDOSTATIN) 500 mcg in sodium chloride 0.9 % 250 mL (2 mcg/mL) infusion 50 mcg/hr (04/09/23 1200)   pantoprazole 8 mg/hr (04/09/23 1200)   propofol (DIPRIVAN) infusion 20 mcg/kg/min (04/09/23 1200)      Patient profile:   52 y.o. y/o male with history of possible alcoholic liver cirrhosis underwent banding of his  esophageal varices in March 2024.  At that point of time plan was for TIPS.  Patient refused procedure . He has ongoing alcohol and cocaine use.  He was suggested to follow-up subsequently for repeat variceal banding but did not do so.  Presents to the hospital with a significant upper GI bleed with hematemesis and melena associated hypotension and lactic acidosis.    Impression:   GI bleed; secondary to esophageal varices in the setting of alcoholic cirrhosis - EGD 4/23: Known Barrett's esophagus, grade 2 esophageal varices with stigmata of recent bleeding, banded x 2.  Portal hypertensive  gastropathy.  Early nonbleeding gastric varices.  No fresh blood seen - hgb 10.5, stable - MELD-Na: 15   Systolic congestive heart failure EtOH abuse.  Cocaine and marijuana abuse   Plan:   - Continue pantoprazole IV twice daily and continuous infusion of octreotide - Continue daily CBC and transfuse as needed to maintain HGB > 7  - Complete 5 days of empiric antibiotics - Repeat EGD in 3 weeks for additional banding - Consider TIPS when stable - Continue lactulose  Lisamarie Coke Leanna Sato  04/09/2023, 12:32 PM

## 2023-04-09 NOTE — Progress Notes (Signed)
Pt supine  by RT x2 and RN x3 without complications. ETT holder placed and ETT secured 24@lips  per MD due to CXR.

## 2023-04-09 NOTE — Consult Note (Signed)
Chief Complaint: Patient was seen in consultation today for esophageal variceal bleed  Referring Physician(s): Mumma,Shannon  Supervising Physician: Gilmer Mor  Patient Status: [facility] - In-pt  History of Present Illness: Ethan Wells is a 52 y.o. male with past medical history of polysubstance abuse (alcohol, tobacco, cocaine, marijuana), GI bleeding, hypertension, hyperlipidemia, CAD, STEMI with BMS placement, CHF, depression with anxiety, adrenal insufficiency, cardiac arrest due to V-fib in the setting of STEMI, who presents with GI bleeding. He was recently admitted for same 3/9-20/2024.   Patient underwent EGD at [facility] 03/01/23 with variceal ligation x2,  Per Dr. Timothy Lasso assessment post EVL:  Arrange for non-emergent tips- pt has transport issues and h/o non compliance. I do not think he will be able to keep up with serial EVL. TIPS would likely be most beneficial to prevent recurrent hospitalization for same issue (as he has already demonstrated) Given his continued alcohol and substance abuse, his long term prognosis in the setting of decompensated cirrhosis is poor. Consideration should be given for hospice, at the very least, palliative care.  Discussed TIPS with Palliative Care 03/04/23 and stated he was interested in a second opinion. He was discharged home and patient did not pursue TIPS placement.    Upon readmission for hematemesis 4/22, patient was again evaluated by GI and TIPS recommended.  IR consulted for evaluation.  EGD recommended and performed 04/08/23.  Post procedure he was emergently reintubated due respiratory distress and he remains intubated presently.   Work-up and evaluation for possible TIPS ongoing.  MELDNa 15  Past Medical History:  Diagnosis Date   CAD (coronary artery disease)    a. 01/2011 Anterior STEMI/Cath/PCI: LM nl, LAD 100d (3.5x19mm Vision BMS placed), LCX 80m, RI 50, RCA min irregs, EF 40% w/ apical, inferoapical HK; b. 10/2022  NSTSEMI/Cath: LM nl, LAD 5p/m, RI 85, LCX 158m, OM1 mild dzs, OM3 100 fills via L->L collats from dLAD, RCA 80p, 155m, RPDA fills via collats from LAD. EF 45-50%-->Med Rx.   Cardiac arrest - ventricular fibrillation    a. In setting of STEMI 01/2011   Chronic pain    Cocaine abuse    Depression with anxiety    ETOH abuse    a. 6-12 beers/day   GERD (gastroesophageal reflux disease)    GI bleed    Hemorrhoids    Hyperlipidemia    Hypertension    Ischemic cardiomyopathy    a. 06/2011 Echo: EF 45-50%, No rwma; b. 10/2022 Echo: EF 55-60%, no rwma, nl RV fxn.   Marijuana abuse    Migraines    Tobacco abuse    a. 1/2 ppd x 26 yrs    Past Surgical History:  Procedure Laterality Date   COLONOSCOPY  08/09/2021   Procedure: COLONOSCOPY;  Surgeon: Midge Minium, MD;  Location: [facility] ENDOSCOPY;  Service: Endoscopy;;   CORONARY ANGIOPLASTY WITH STENT PLACEMENT     ESOPHAGOGASTRODUODENOSCOPY N/A 05/30/2022   Procedure: ESOPHAGOGASTRODUODENOSCOPY (EGD);  Surgeon: Toney Reil, MD;  Location: [facility] ENDOSCOPY;  Service: Gastroenterology;  Laterality: N/A;   ESOPHAGOGASTRODUODENOSCOPY (EGD) WITH PROPOFOL N/A 03/01/2021   Procedure: ESOPHAGOGASTRODUODENOSCOPY (EGD) WITH PROPOFOL;  Surgeon: Toney Reil, MD;  Location: [facility] SURGERY CNTR;  Service: Endoscopy;  Laterality: N/A;   ESOPHAGOGASTRODUODENOSCOPY (EGD) WITH PROPOFOL N/A 08/09/2021   Procedure: ESOPHAGOGASTRODUODENOSCOPY (EGD) WITH PROPOFOL;  Surgeon: Midge Minium, MD;  Location: [facility] ENDOSCOPY;  Service: Endoscopy;  Laterality: N/A;   ESOPHAGOGASTRODUODENOSCOPY (EGD) WITH PROPOFOL N/A 03/01/2023   Procedure: ESOPHAGOGASTRODUODENOSCOPY (EGD) WITH PROPOFOL;  Surgeon: Timothy Lasso,  Thomas Hoff, DO;  Location: [facility] ENDOSCOPY;  Service: Gastroenterology;  Laterality: N/A;   EVALUATION UNDER ANESTHESIA WITH HEMORRHOIDECTOMY N/A 10/31/2022   Procedure: EXAM UNDER ANESTHESIA WITH SUTURE LIGATION OF TWO BLEEDING PEDICLES;  Surgeon: Henrene Dodge, MD;   Location: [facility] ORS;  Service: General;  Laterality: N/A;   FLEXIBLE SIGMOIDOSCOPY N/A 10/02/2022   Procedure: FLEXIBLE SIGMOIDOSCOPY;  Surgeon: Meryl Dare, MD;  Location: [facility] ENDOSCOPY;  Service: Gastroenterology;  Laterality: N/A;   LEFT HEART CATH AND CORONARY ANGIOGRAPHY N/A 11/04/2022   Procedure: LEFT HEART CATH AND CORONARY ANGIOGRAPHY;  Surgeon: Iran Ouch, MD;  Location: [facility] INVASIVE CV LAB;  Service: Cardiovascular;  Laterality: N/A;   VISCERAL ANGIOGRAPHY N/A 10/24/2022   Procedure: VISCERAL ANGIOGRAPHY;  Surgeon: Annice Needy, MD;  Location: [facility] INVASIVE CV LAB;  Service: Cardiovascular;  Laterality: N/A;    Allergies: Penicillins and Other  Medications: Prior to Admission medications   Medication Sig Start Date End Date Taking? Authorizing Provider  acetaminophen (TYLENOL) 500 MG tablet Take 1 tablet (500 mg total) by mouth every 8 (eight) hours as needed for mild pain, fever or headache. 03/05/23   [name], MD  gabapentin (NEURONTIN) 300 MG capsule Take 1 capsule (300 mg total) by mouth 3 (three) times daily. 03/05/23 06/03/23  [name], MD  hydrocortisone (CORTEF) 10 MG tablet Take 1 tablet (10 mg total) by mouth daily. 03/05/23 03/04/24  [name], MD  hydrocortisone (CORTEF) 5 MG tablet Take 1 tablet (5 mg total) by mouth daily after lunch. 03/05/23 03/04/24  [name], MD  ibuprofen (ADVIL) 800 MG tablet Take 800 mg by mouth every 8 (eight) hours as needed. 12/21/22   [provider]  lactulose (CHRONULAC) 10 GM/15ML solution Take 30 mLs (20 g total) by mouth 2 (two) times daily. Titrate to 1-2 BM per day 03/05/23   [name], MD  methocarbamol (ROBAXIN) 750 MG tablet Take 1 tablet (750 mg total) by mouth daily as needed for muscle spasms. 03/05/23   [name], MD  midodrine (PROAMATINE) 10 MG tablet Take 1 tablet (10 mg total) by mouth 3 (three) times daily with meals. Skip the dose if Systolic BP greater than 110 mmHg and or HR greater  than 100 03/05/23 06/03/23  [name], MD  Multiple Vitamin (MULTIVITAMIN WITH MINERALS) TABS tablet Take 1 tablet by mouth daily. 03/06/23 03/05/24  [name], MD  nitroGLYCERIN (NITROSTAT) 0.4 MG SL tablet Place 1 tablet (0.4 mg total) under the tongue every 5 (five) minutes as needed for chest pain. 03/05/23   [name], MD  pantoprazole (PROTONIX) 40 MG tablet Take 1 tablet (40 mg total) by mouth 2 (two) times daily. 03/05/23 03/04/24  [name], MD  QUEtiapine (SEROQUEL) 25 MG tablet Take 1 tablet (25 mg total) by mouth at bedtime. 03/05/23 06/03/23  [name], MD  rosuvastatin (CRESTOR) 40 MG tablet Take 1 tablet (40 mg total) by mouth daily. 03/05/23 03/04/24  [name], MD  spironolactone (ALDACTONE) 25 MG tablet Take 0.5 tablets (12.5 mg total) by mouth daily. 03/06/23 06/04/23  [name], MD  torsemide 40 MG TABS Take 40 mg by mouth 2 (two) times daily for 14 days, THEN 40 mg daily. Patient not taking: Reported on 04/07/2023 03/05/23 03/18/24  [name], MD     Family History  Problem Relation Age of Onset   Coronary artery disease Mother        alive   Other Other        2  sisters alive and well   Alcohol abuse Father        died @ 69   Other Paternal Grandmother        PACER    Social History   Socioeconomic History   Marital status: Single    Spouse name: Not on file   Number of children: Not on file   Years of education: Not on file   Highest education level: Not on file  Occupational History   Not on file  Tobacco Use   Smoking status: Every Day    Packs/day: 0.50    Years: 41.00    Additional pack years: 0.00    Total pack years: 20.50    Types: Cigarettes   Smokeless tobacco: Never   Tobacco comments:    started age 80  Vaping Use   Vaping Use: Never used  Substance and Sexual Activity   Alcohol use: Yes    Alcohol/week: 12.0 standard drinks of alcohol    Types: 12 Cans of beer per week    Comment: at least a 6 pack of beer daily and  often a 12 pack   Drug use: Yes    Types: Cocaine, Marijuana   Sexual activity: Not on file  Other Topics Concern   Not on file  Social History Narrative   Single, lives in [place] with his mother.  He does not routinely exercise.  He sometimes works, helping to deliver appliances to homes.   Social Determinants of Health   Financial Resource Strain: Not on file  Food Insecurity: No Food Insecurity (04/07/2023)   Hunger Vital Sign    Worried About Running Out of Food in the Last Year: Never true    Ran Out of Food in the Last Year: Never true  Transportation Needs: No Transportation Needs (04/07/2023)   PRAPARE - Administrator, Civil Service (Medical): No    Lack of Transportation (Non-Medical): No  Physical Activity: Not on file  Stress: Not on file  Social Connections: Not on file     Review of Systems: A 12 point ROS discussed and pertinent positives are indicated in the HPI above.  All other systems are negative.  Review of Systems  Unable to perform ROS: Intubated    Vital Signs: BP (!) 85/56   Pulse 93   Temp (!) 97.5 F (36.4 C)   Resp 16   Ht 5\' 5"  (1.651 m)   Wt 201 lb 15.1 oz (91.6 kg)   SpO2 98%   BMI 33.60 kg/m   Physical Exam Vitals reviewed.  Cardiovascular:     Rate and Rhythm: Normal rate and regular rhythm.  Pulmonary:     Comments: intubated Abdominal:     Palpations: Abdomen is soft.  Neurological:     Comments: sedated      MD Evaluation Airway: Other (comments) Airway comments: intubated Heart: WNL Abdomen: WNL Chest/ Lungs: WNL ASA  Classification: 3 Mallampati/Airway Score: Three   Imaging: DG Chest Port 1 View  Result Date: 04/09/2023 CLINICAL DATA:  Hypoxia EXAM: PORTABLE CHEST 1 VIEW COMPARISON:  04/09/2023 FINDINGS: Endotracheal tube is again noted and stable. Left jugular central line are again seen in satisfactory position. Significant improvement in the degree of congestive failure is noted. Some  residual basilar opacities are seen. No bony abnormality is noted. IMPRESSION: Near complete resolution of previously seen pulmonary edema. Electronically Signed   By: Alcide Clever M.D.   On: 04/09/2023 12:12   DG CHEST PORT  1 VIEW  Result Date: 04/09/2023 CLINICAL DATA:  Respiratory failure, prone repositioning EXAM: PORTABLE CHEST 1 VIEW COMPARISON:  04/08/2023 FINDINGS: Endotracheal tube is seen 14 mm above the carina. Left internal jugular central venous catheter tip noted overlying the expected superior vena cava. Pulmonary insufflation is stable. Superimposed extensive bilateral perihilar and lower lung zone consolidation in keeping with severe alveolar pulmonary edema or diffuse pneumonic infiltrate has progressed in the interval since prior examination. No pneumothorax or pleural effusion. Cardiac size within normal limits. IMPRESSION: 1. Endotracheal tube 14 mm above the carina. 2. Progressive bilateral perihilar and lower lung zone consolidation, severe alveolar pulmonary edema or diffuse pneumonic infiltrate. Electronically Signed   By: Helyn Numbers M.D.   On: 04/09/2023 03:01   DG CHEST PORT 1 VIEW  Result Date: 04/08/2023 CLINICAL DATA:  Hypoxia EXAM: PORTABLE CHEST 1 VIEW COMPARISON:  04/07/2023 FINDINGS: The endotracheal tube has been placed with tip measuring 3.8 cm above the carina. Left central venous catheter with tip over the low SVC region. No pneumothorax. Cardiac enlargement with bilateral perihilar infiltrates, progressing since prior study, likely edema. No pleural effusions. No pneumothorax. Mediastinal contours appear intact. IMPRESSION: 1. Appliances appear in satisfactory position. 2. Cardiac enlargement with perihilar infiltration increasing since prior study, likely edema. Electronically Signed   By: Burman Nieves M.D.   On: 04/08/2023 22:04   DG Chest Port 1 View  Result Date: 04/07/2023 CLINICAL DATA:  Hematemesis and abdominal pain EXAM: PORTABLE CHEST 1 VIEW  COMPARISON:  01/18/2023 FINDINGS: Unchanged cardiomegaly. No new focal pulmonary opacity. Chronic coarsened interstitial markings without overt pulmonary edema. No pleural effusion or pneumothorax. No acute osseous abnormality. IMPRESSION: Unchanged cardiomegaly. No acute cardiopulmonary process. Electronically Signed   By: Wiliam Ke M.D.   On: 04/07/2023 11:27    Labs:  CBC: Recent Labs    04/08/23 1400 04/08/23 2158 04/08/23 2215 04/08/23 2324 04/09/23 0453 04/09/23 0533 04/09/23 1230 04/09/23 1508  WBC 10.5  --  19.7*  --   --  10.3  --  13.7*  HGB 10.4*   < > 13.0   < > 10.2* 10.5* 10.9* 11.0*  HCT 30.5*   < > 39.6   < > 30.0* 30.2* 32.0* 32.3*  PLT 99*  --  171  --   --  100*  --  121*   < > = values in this interval not displayed.    COAGS: Recent Labs    01/17/23 0043 01/18/23 0204 02/22/23 0450 02/28/23 0445 03/02/23 0330 03/03/23 0340 03/04/23 0640 04/07/23 1026  INR 1.3*   < > 1.6*   < > 1.3* 1.4* 1.3* 1.6*  APTT 37*  --  33  --   --   --   --  34   < > = values in this interval not displayed.    BMP: Recent Labs    04/07/23 2110 04/08/23 0250 04/08/23 1359 04/08/23 2158 04/08/23 2215 04/08/23 2324 04/09/23 0138 04/09/23 0453 04/09/23 0533 04/09/23 1230  NA 141 141 143   < > 141   < > 144 144 141 144  K 2.7* 3.4* 3.4*   < > 2.8*   < > 3.1* 3.1* 3.1* 2.9*  CL 103 107 105  --  102  --   --   --  106  --   CO2 28 28  --   --  24  --   --   --  24  --   GLUCOSE 152* 161* 143*  --  212*  --   --   --  174*  --   BUN --  14  --   --   --  16  --   CALCIUM 7.3* 7.4*  --   --  8.2*  --   --   --  7.5*  --   CREATININE 0.73 0.67 0.50*  --  0.82  --   --   --  0.91  --   GFRNONAA >60 >60  --   --  >60  --   --   --  >60  --    < > = values in this interval not displayed.    LIVER FUNCTION TESTS: Recent Labs    04/07/23 1026 04/07/23 2110 04/08/23 2215 04/09/23 0533  BILITOT 1.6* 2.6* 3.6* 2.5*  AST 44* 47* 49* 37  ALT ALKPHOS 153* 144* 180* 131*  PROT 5.6* 5.6* 7.0 5.6*  ALBUMIN 2.3* 2.3* 2.8* 2.3*    TUMOR MARKERS: No results for input(s): "AFPTM", "CEA", "CA199", "CHROMGRNA" in the last 8760 hours.  Assessment and Plan: Recurrent esophageal variceal bleeding s/p EVL x2 (2 bands placed 3/16, 2 bands placed 4/23) Patient with history of bleeding esophageal varices in the setting of decompensated cirrhosis as a result of polysubstance abuse.  Patient recently admitted for same in March 2024.  At that time TIPS consultation was suggested, however patient was stable for discharge home and he deferred further work-up.  Once outpatient, he declined to pursue TIPS at all.  He is now readmitted for same and again TIPS consultation being considered.  Unfortunately at this time, patient intubated due to respiratory distress post EGD yesterday afternoon.   Given his reluctance in the past with current hemodynamic stability with need of management of his respiratory distress in the ICU, TIPS remains side tabled.  Case discussed with Dr. Loreta Ave. Imaging and history reviewed.  Patient is a candidate for TIPS and decision to proceed will be based on medical status/urgency with POA vs. Patient decision once extubated/cognizant.  Last ECHO performed 01/17/23 was normal, however given history of systolic CHF repeat ECHO reasonable as long as non-urgent indication remains for TIPS.   IR following.   Thank you for this interesting consult.  I greatly enjoyed meeting Ethan Wells and look forward to participating in their care.  A copy of this report was sent to the requesting provider on this date.  Electronically Signed: Hoyt Koch, PA 04/09/2023, 5:17 PM   I spent a total of 40 Minutes    in face to face in clinical consultation, greater than 50% of which was counseling/coordinating care for esophageal varices.

## 2023-04-09 NOTE — Progress Notes (Signed)
Pt proned per verbal order, head turned to the left. ETT taped at 27 at the lip with cloth tape, mepilex bandage on each cheek and above lip. Pt tolerated proning well.

## 2023-04-09 NOTE — Progress Notes (Signed)
Full progress note to follow. Okay to unprone. Continue diuresis. Consider SBT. Keep eye on H/H Eventual eval for TIPS if patient agreeable.  Myrla Halsted MD PCCM

## 2023-04-09 NOTE — Progress Notes (Signed)
Pt head turned to the left per protocol. Pt tolerated well

## 2023-04-09 NOTE — Progress Notes (Signed)
   NAME:  Ethan Wells, MRN:  [id number], DOB:  09/10/1971, LOS: 2 ADMISSION DATE:  04/07/2023, CONSULTATION DATE:  04/07/2023 REFERRING MD:  Dr. Arnoldo Morale, CHIEF COMPLAINT:  Upper GI bleed    History of Present Illness:  Ethan Wells is a 52 yo male with a PMH significant for recurrent GI bleed with known variceal bleed, ischemic cardiomyopathy, EtOH abuse, polysubstance abuse, HTN, HLD, depression, and anxiety who presented to [facility] ED 4/22 for acute GI bleed. Of note patient was seen within this health sytem 1 month ago for significant GI bleed requiring banding per GI with TIPS procedure recommended but patient declined. Patient reports last drink and cocaine use was day prior to admission.  On ED arrival patient was seen hypotensive and mildly tachycardic. Lab work significant for K 2.3, glucoses 140, Mg 1.2, alkaline phos 153, albumin 2.3, AST 44, total protein 5.6, total bili 1.6, lactic 5.6, hgb 7.1, and hct 23.2. Given active bleed with compromised hemodynamics, PCCM consulted for further management and admission.   Pertinent  Medical History  Recurrent GI bleed with known variceal bleed, ischemic cardiomyopathy, EtOH abuse, polysubstance abuse, HTN, HLD, depression, and anxiety   Significant Hospital Events: Including procedures, antibiotic start and stop dates in addition to other pertinent events   4/22 admitted with acute GI bleed   Interim History / Subjective:  ARDS vs. Edema overnight, started on lasix and proned.  Seems improved with this.  Objective   Blood pressure (!) 85/56, pulse 78, temperature (!) 97.5 F (36.4 C), resp. rate 15, height  (1.651 m), weight 91.6 kg, SpO2 98 %.    Vent Mode: PRVC FiO2 (%):  [60 %-100 %] 60 % Set Rate:  [16 bmp-30 bmp] 16 bmp Vt Set:  [490 mL] 490 mL PEEP:  [8 cmH20-12 cmH20] 8 cmH20 Plateau Pressure:  [15 cmH20-28 cmH20] 15 cmH20   Intake/Output Summary (Last 24 hours) at 04/09/2023 1644 Last data filed at 04/09/2023  1600 Gross per 24 hour  Intake 1970.97 ml  Output 5680 ml  Net -3709.03 ml    Filed Weights   04/07/23 1919 04/08/23 0428 04/09/23 0700  Weight: 89.4 kg 91.6 kg 91.6 kg    Examination: No distress. Chronically ill appearing Heart sounds regular, ext warm Lungs with good air movement Abd soft 1+ edema  CXR markedly better  Assessment & Plan:   Hemorrhagic shock Variceal bleed- post banding Hx adrenal insufficiency  P: Stress steroids given baseline steroids use PPI/octreotide Trend H/H, INR, usual transfusion thresholds  Hx cirrhosis 2/2 EtOH P: Continue home lactulose Empiric CTX    Hx Systolic congestive heart failure (EF 50-55% with WMA 02/04/23), HoTN CAD, HTN, HLD P: -Continuous telemetry  -Strict intake and output -Continue home Midodrine -Stress steroids in lieu of home Cortef (  daily) -Hold home Rosuvastatin, Spironolactone, -Torsemide -Monitor volume status closely with blood product volume    Metabolic encephalopathy Hx polysubstance abuse including EtOH - Thiamine/folate, check ammonia - Monitor for s/s of w/d  TACO- improved with diuresis - Supinate - Consider SBT depending on mental status  Best Practice (right click and "Reselect all SmartList Selections" daily)   Diet/type: NPO DVT prophylaxis: SCD GI prophylaxis: PPI Lines: Central line Foley:  N/A Code Status:  full code Last date of multidisciplinary goals of care discussion: 04/08/23  33 min cc time Myrla Halsted MD PCCM

## 2023-04-09 NOTE — Progress Notes (Signed)
Pt head turned to the right per protocol. Pt tolerated well  

## 2023-04-09 NOTE — Progress Notes (Signed)
RT and Rnx2 turned patients head to the right and arms . No complications.

## 2023-04-10 ENCOUNTER — Inpatient Hospital Stay (HOSPITAL_COMMUNITY): Payer: Medicare HMO

## 2023-04-10 DIAGNOSIS — D649 Anemia, unspecified: Secondary | ICD-10-CM | POA: Diagnosis not present

## 2023-04-10 DIAGNOSIS — K920 Hematemesis: Secondary | ICD-10-CM | POA: Diagnosis not present

## 2023-04-10 DIAGNOSIS — I8501 Esophageal varices with bleeding: Secondary | ICD-10-CM | POA: Diagnosis not present

## 2023-04-10 DIAGNOSIS — K922 Gastrointestinal hemorrhage, unspecified: Secondary | ICD-10-CM | POA: Diagnosis not present

## 2023-04-10 DIAGNOSIS — R578 Other shock: Secondary | ICD-10-CM | POA: Diagnosis not present

## 2023-04-10 DIAGNOSIS — K209 Esophagitis, unspecified without bleeding: Secondary | ICD-10-CM | POA: Diagnosis not present

## 2023-04-10 DIAGNOSIS — J9601 Acute respiratory failure with hypoxia: Secondary | ICD-10-CM | POA: Diagnosis not present

## 2023-04-10 LAB — CBC
HCT: 30.9 % — ABNORMAL LOW (ref 39.0–52.0)
HCT: 27.2 % — ABNORMAL LOW (ref 39.0–52.0)
Hemoglobin: 9.8 g/dL — ABNORMAL LOW (ref 13.0–17.0)
Hemoglobin: 9 g/dL — ABNORMAL LOW (ref 13.0–17.0)
MCH: 28.8 pg (ref 26.0–34.0)
MCH: 30 pg (ref 26.0–34.0)
MCHC: 31.7 g/dL (ref 30.0–36.0)
MCHC: 33.1 g/dL (ref 30.0–36.0)
MCV: 90.9 fL (ref 80.0–100.0)
MCV: 90.7 fL (ref 80.0–100.0)
Platelets: 73 10*3/uL — ABNORMAL LOW (ref 150–400)
Platelets: 65 10*3/uL — ABNORMAL LOW (ref 150–400)
RBC: 3.4 MIL/uL — ABNORMAL LOW (ref 4.22–5.81)
RBC: 3 MIL/uL — ABNORMAL LOW (ref 4.22–5.81)
RDW: 17.4 % — ABNORMAL HIGH (ref 11.5–15.5)
RDW: 17.5 % — ABNORMAL HIGH (ref 11.5–15.5)
WBC: 8.7 10*3/uL (ref 4.0–10.5)
WBC: 7.6 10*3/uL (ref 4.0–10.5)
nRBC: 0 % (ref 0.0–0.2)
nRBC: 0 % (ref 0.0–0.2)

## 2023-04-10 LAB — COMPREHENSIVE METABOLIC PANEL
ALT: 17 U/L (ref 0–44)
AST: 38 U/L (ref 15–41)
Albumin: 2.3 g/dL — ABNORMAL LOW (ref 3.5–5.0)
Alkaline Phosphatase: 124 U/L (ref 38–126)
Anion gap: 8 (ref 5–15)
BUN: 22 mg/dL — ABNORMAL HIGH (ref 6–20)
CO2: 27 mmol/L (ref 22–32)
Calcium: 7.7 mg/dL — ABNORMAL LOW (ref 8.9–10.3)
Chloride: 110 mmol/L (ref 98–111)
Creatinine, Ser: 0.95 mg/dL (ref 0.61–1.24)
GFR, Estimated: >60 mL/min (ref >60)
Glucose, Bld: 144 mg/dL — ABNORMAL HIGH (ref 70–99)
Potassium: 3.3 mmol/L — ABNORMAL LOW (ref 3.5–5.1)
Sodium: 145 mmol/L (ref 135–145)
Total Bilirubin: 2.2 mg/dL — ABNORMAL HIGH (ref 0.3–1.2)
Total Protein: 5.6 g/dL — ABNORMAL LOW (ref 6.5–8.1)

## 2023-04-10 LAB — GLUCOSE, CAPILLARY
Glucose-Capillary: 140 mg/dL — ABNORMAL HIGH (ref 70–99)
Glucose-Capillary: 140 mg/dL — ABNORMAL HIGH (ref 70–99)
Glucose-Capillary: 144 mg/dL — ABNORMAL HIGH (ref 70–99)
Glucose-Capillary: 135 mg/dL — ABNORMAL HIGH (ref 70–99)
Glucose-Capillary: 197 mg/dL — ABNORMAL HIGH (ref 70–99)
Glucose-Capillary: 111 mg/dL — ABNORMAL HIGH (ref 70–99)

## 2023-04-10 LAB — PHOSPHORUS: Phosphorus: 3 mg/dL (ref 2.5–4.6)

## 2023-04-10 LAB — MAGNESIUM: Magnesium: 1.7 mg/dL (ref 1.7–2.4)

## 2023-04-10 MED ORDER — GABAPENTIN 300 MG PO CAPS
300.0000 mg | ORAL_CAPSULE | Freq: Three times a day (TID) | ORAL | Status: DC
  Administered 2023-04-10 – 2023-04-13 (×9): 300 mg via ORAL
  Filled 2023-04-10 (×9): qty 1

## 2023-04-10 MED ORDER — POTASSIUM CHLORIDE 10 MEQ/50ML IV SOLN
10.0000 meq | INTRAVENOUS | Status: AC
  Administered 2023-04-10 (×6): 10 meq via INTRAVENOUS
  Filled 2023-04-10 (×6): qty 50

## 2023-04-10 MED ORDER — MAGNESIUM SULFATE 2 GM/50ML IV SOLN
2.0000 g | Freq: Once | INTRAVENOUS | Status: AC
  Administered 2023-04-10: 2 g via INTRAVENOUS
  Filled 2023-04-10: qty 50

## 2023-04-10 MED ORDER — HYDROCORTISONE SOD SUC (PF) 100 MG IJ SOLR
50.0000 mg | Freq: Every day | INTRAMUSCULAR | Status: DC
  Administered 2023-04-11 – 2023-04-13 (×3): 50 mg via INTRAVENOUS
  Filled 2023-04-10 (×3): qty 2

## 2023-04-10 MED ORDER — QUETIAPINE FUMARATE 25 MG PO TABS
25.0000 mg | ORAL_TABLET | Freq: Every day | ORAL | Status: DC
  Administered 2023-04-10 – 2023-04-13 (×3): 25 mg via ORAL
  Filled 2023-04-10 (×3): qty 1

## 2023-04-10 MED ORDER — POTASSIUM CHLORIDE 10 MEQ/100ML IV SOLN: 10.0000 meq | INTRAVENOUS | Status: DC

## 2023-04-10 MED ORDER — HYDROCORTISONE SOD SUC (PF) 100 MG IJ SOLR
50.0000 mg | Freq: Two times a day (BID) | INTRAMUSCULAR | Status: DC
  Administered 2023-04-10: 50 mg via INTRAVENOUS
  Filled 2023-04-10: qty 2

## 2023-04-10 MED ORDER — POTASSIUM CHLORIDE 10 MEQ/100ML IV SOLN
10.0000 meq | INTRAVENOUS | Status: DC
  Filled 2023-04-10: qty 100

## 2023-04-10 MED ORDER — POTASSIUM CHLORIDE 20 MEQ PO PACK: 20.0000 meq | PACK | ORAL | Status: DC

## 2023-04-10 MED ORDER — ORAL CARE MOUTH RINSE: 15.0000 mL | OROMUCOSAL | Status: DC | PRN

## 2023-04-10 MED ORDER — TRAMADOL HCL 50 MG PO TABS
50.0000 mg | ORAL_TABLET | Freq: Two times a day (BID) | ORAL | Status: DC | PRN
  Administered 2023-04-10 – 2023-04-11 (×2): 50 mg via ORAL
  Filled 2023-04-10 (×2): qty 1

## 2023-04-10 NOTE — Progress Notes (Addendum)
Pharmacy Electrolyte Replacement  Recent Labs:  Recent Labs    04/10/23 0528  K 3.3*  MG 1.7  PHOS 3.0  CREATININE 0.95    Low Critical Values (K </= 2.5, Phos </= 1, Mg </= 1) Present: None  Plan: Will give 2g mag and IV Kcl. No NG access.   Estill Batten, PharmD, BCCCP

## 2023-04-10 NOTE — Procedures (Signed)
Extubation Procedure Note  Patient Details:   Name: LINDEL MARCELL DOB: [birth year] MRN: [id number]   Airway Documentation:    Vent end date: 04/10/23 Vent end time: 1035   Evaluation  O2 sats: stable throughout Complications: No apparent complications Patient did tolerate procedure well. Bilateral Breath Sounds: Diminished, Clear   Yes  RT extubated patient to 2L [place] per MD order. Positive cuff leak. No stridor noted at this time. RT will continue to monitor as needed.   Jaquelyn Bitter 04/10/2023, 10:36 AM

## 2023-04-10 NOTE — Progress Notes (Signed)
Progress Note  Primary GI: Dr. Allegra Lai  LOS: 3 days   Chief Complaint:GI bleed   Subjective   Patient is intubated and sedated.  Slightly arouses to verbal stimuli.   Objective   Vital signs in last 24 hours: Temp:  [94.5 F (34.7 C)-98.9 F (37.2 C)] 97.1 F (36.2 C) (04/25 0751) Pulse Rate:  [55-93] 58 (04/25 0821) Resp:  [8-25] 12 (04/25 0821) BP: (72-132)/(45-106) 121/79 (04/25 0800) SpO2:  [95 %-100 %] 96 % (04/25 0821) FiO2 (%):  [40 %-60 %] 40 % (04/25 0821) Weight:  [89.7 kg] 89.7 kg (04/25 0500) Last BM Date : 04/08/23 Last BM recorded by nurses in past 5 days Stool Type: Type 6 (Mushy consistency with ragged edges) (04/08/2023  8:56 PM)  General:   male intubated and slightly sedated Heart:  Regular rate and rhythm; no murmurs Pulm: Clear anteriorly; no wheezing Abdomen: soft, nondistended, normal bowel sounds in all quadrants. Nontender without guarding. No organomegaly appreciated. Extremities: Pedal edema, SCDs in place Neurologic: Slightly responsive to verbal stimuli  Intake/Output from previous day: 04/24 0701 - 04/25 0700 In: 2012.2 [I.V.:1349.4; IV Piggyback:662.9] Out: 3055 [Urine:3055] Intake/Output this shift: Total I/O In: 103.7 [I.V.:53.2; IV Piggyback:50.5] Out: 60 [Urine:60]  Studies/Results: DG Chest Port 1 View  Result Date: 04/09/2023 CLINICAL DATA:  Hypoxia EXAM: PORTABLE CHEST 1 VIEW COMPARISON:  04/09/2023 FINDINGS: Endotracheal tube is again noted and stable. Left jugular central line are again seen in satisfactory position. Significant improvement in the degree of congestive failure is noted. Some residual basilar opacities are seen. No bony abnormality is noted. IMPRESSION: Near complete resolution of previously seen pulmonary edema. Electronically Signed   By: Alcide Clever M.D.   On: 04/09/2023 12:12   DG CHEST PORT 1 VIEW  Result Date: 04/09/2023 CLINICAL DATA:  Respiratory failure, prone repositioning EXAM: PORTABLE CHEST 1 VIEW  COMPARISON:  04/08/2023 FINDINGS: Endotracheal tube is seen 14 mm above the carina. Left internal jugular central venous catheter tip noted overlying the expected superior vena cava. Pulmonary insufflation is stable. Superimposed extensive bilateral perihilar and lower lung zone consolidation in keeping with severe alveolar pulmonary edema or diffuse pneumonic infiltrate has progressed in the interval since prior examination. No pneumothorax or pleural effusion. Cardiac size within normal limits. IMPRESSION: 1. Endotracheal tube 14 mm above the carina. 2. Progressive bilateral perihilar and lower lung zone consolidation, severe alveolar pulmonary edema or diffuse pneumonic infiltrate. Electronically Signed   By: Helyn Numbers M.D.   On: 04/09/2023 03:01   DG CHEST PORT 1 VIEW  Result Date: 04/08/2023 CLINICAL DATA:  Hypoxia EXAM: PORTABLE CHEST 1 VIEW COMPARISON:  04/07/2023 FINDINGS: The endotracheal tube has been placed with tip measuring 3.8 cm above the carina. Left central venous catheter with tip over the low SVC region. No pneumothorax. Cardiac enlargement with bilateral perihilar infiltrates, progressing since prior study, likely edema. No pleural effusions. No pneumothorax. Mediastinal contours appear intact. IMPRESSION: 1. Appliances appear in satisfactory position. 2. Cardiac enlargement with perihilar infiltration increasing since prior study, likely edema. Electronically Signed   By: Burman Nieves M.D.   On: 04/08/2023 22:04    Lab Results: Recent Labs    04/09/23 0533 04/09/23 1230 04/09/23 1508 04/09/23 2310  WBC 10.3  --  13.7* 15.1*  HGB 10.5* 10.9* 11.0* 10.5*  HCT 30.2* 32.0* 32.3* 31.0*  PLT 100*  --  121* 114*   BMET Recent Labs    04/08/23 2215 04/08/23 2324 04/09/23 0533 04/09/23 1230 04/10/23 1610  NA 141   < > 141 144 145  K 2.8*   < > 3.1* 2.9* 3.3*  CL 102  --  106  --  110  CO2 24  --  24  --  27  GLUCOSE 212*  --  174*  --  144*  BUN 14  --  16  --  22*   CREATININE 0.82  --  0.91  --  0.95  CALCIUM 8.2*  --  7.5*  --  7.7*   < > = values in this interval not displayed.   LFT Recent Labs    04/10/23 0528  PROT 5.6*  ALBUMIN 2.3*  AST 38  ALT 17  ALKPHOS 124  BILITOT 2.2*   PT/INR Recent Labs    04/07/23 1026  LABPROT 18.9*  INR 1.6*     Scheduled Meds:  Chlorhexidine Gluconate Cloth  6 each Topical Daily   folic acid  1 mg Intravenous Daily   hydrocortisone sod succinate (SOLU-CORTEF) inj  50 mg Intravenous Q12H   insulin aspart  0-15 Units Subcutaneous Q4H   lactulose  30 g Oral BID   midodrine  10 mg Oral TID WC   multivitamin with minerals  1 tablet Oral Daily   mouth rinse  15 mL Mouth Rinse Q2H   [START ON 04/11/2023] pantoprazole  40 mg Intravenous Q12H   thiamine  100 mg Oral Daily   Or   thiamine  100 mg Intravenous Daily   Continuous Infusions:  cefTRIAXone (ROCEPHIN)  IV 200 mL/hr at 04/10/23 0800   dexmedetomidine (PRECEDEX) IV infusion 0.8 mcg/kg/hr (04/10/23 0800)   octreotide (SANDOSTATIN) 500 mcg in sodium chloride 0.9 % 250 mL (2 mcg/mL) infusion 50 mcg/hr (04/10/23 0800)   pantoprazole 8 mg/hr (04/10/23 0800)   potassium chloride        Patient profile:   52 y.o. y/o male with history of possible alcoholic liver cirrhosis underwent banding of his esophageal varices in March 2024. At that point of time plan was for TIPS. Patient refused procedure . He has ongoing alcohol and cocaine use. He was suggested to follow-up subsequently for repeat variceal banding but did not do so. Presents to the hospital with a significant upper GI bleed with hematemesis and melena associated hypotension and lactic acidosis.     Impression:   GI bleed; secondary to esophageal varices in the setting of alcoholic cirrhosis - EGD 4/23: Known Barrett's esophagus, grade 2 esophageal varices with stigmata of recent bleeding, banded x 2.  Portal hypertensive gastropathy.  Early nonbleeding gastric varices.  No fresh blood  seen - hgb 10.5, stable - MELD 3.0:17  Systolic congestive heart failure EtOH abuse.  Cocaine and marijuana abuse    Plan:   - Continue pantoprazole IV twice daily - Continue daily CBC and transfuse as needed to maintain HGB > 7  - Continue empiric antibiotics - Repeat EGD in 3 weeks for additional banding - Consider TIPS when stable - Continue lactulose - Can stop octreotide with no recurrence of bleeding and stable hgb  Leshae Mcclay M Sou Nohr  04/10/2023, 9:56 AM

## 2023-04-10 NOTE — Progress Notes (Signed)
NAME:  KIRT CHEW, MRN:  [id number], DOB:  August 08, 1971, LOS: 3 ADMISSION DATE:  04/07/2023, CONSULTATION DATE:  04/07/2023 REFERRING MD:  Dr. Arnoldo Morale, CHIEF COMPLAINT:  Upper GI bleed    History of Present Illness:  EMIN FOREE is a 52 yo male with a PMH significant for recurrent GI bleed with known variceal bleed, ischemic cardiomyopathy, EtOH abuse, polysubstance abuse, HTN, HLD, depression, and anxiety who presented to [facility] ED 4/22 for acute GI bleed. Of note patient was seen within this health sytem 1 month ago for significant GI bleed requiring banding per GI with TIPS procedure recommended but patient declined. Patient reports last drink and cocaine use was day prior to admission.  On ED arrival patient was seen hypotensive and mildly tachycardic. Lab work significant for K 2.3, glucoses 140, Mg 1.2, alkaline phos 153, albumin 2.3, AST 44, total protein 5.6, total bili 1.6, lactic 5.6, hgb 7.1, and hct 23.2. Given active bleed with compromised hemodynamics, PCCM consulted for further management and admission.   Pertinent  Medical History  Recurrent GI bleed with known variceal bleed, ischemic cardiomyopathy, EtOH abuse, polysubstance abuse, HTN, HLD, depression, and anxiety   Significant Hospital Events: Including procedures, antibiotic start and stop dates in addition to other pertinent events   4/22 admitted with acute GI bleed  4/23 EGD >> X esophagus, grade 2 varices, banded x 2, portal gastropathy 4/23 intubated for flash pulm edema , started on Lasix and proned  Interim History / Subjective:  Required proning on 4/24, reverted to supine position. Diuresed 3 L. Critically ill, intubated, not on pressors On Dilaudid and Precedex drip    Objective   Blood pressure 120/73, pulse (!) 58, temperature (!) 97.1 F (36.2 C), temperature source Axillary, resp. rate 12, height  (1.651 m), weight 89.7 kg, SpO2 96 %.    Vent Mode: CPAP;PSV FiO2 (%):  [40 %-60 %] 40  % Set Rate:  [16 bmp-22 bmp] 16 bmp Vt Set:  [490 mL] 490 mL PEEP:  [5 cmH20-12 cmH20] 5 cmH20 Pressure Support:  [5 cmH20] 5 cmH20 Plateau Pressure:  [15 cmH20-24 cmH20] 23 cmH20   Intake/Output Summary (Last 24 hours) at 04/10/2023 0832 Last data filed at 04/10/2023 0600 Gross per 24 hour  Intake 1811.31 ml  Output 2355 ml  Net -543.69 ml    Filed Weights   04/08/23 0428 04/09/23 0700 04/10/23 0500  Weight: 91.6 kg 91.6 kg 89.7 kg    Examination: No distress. Chronically ill appearing , obese man Heart sounds regular, ext warm No accessory muscle use, decreased breath sounds bilateral Abd soft RASS 0 to +1, does not follow commands 1+ edema  CXR marked improvement in bilateral infiltrates Labs show mild hypokalemia, normal renal function, mag 1.7, mild leukocytosis, stable anemia  Assessment & Plan:    Acute respiratory failure with hypoxia TACO- improved with diuresis -Spontaneous breathing trials with goal extubation   Hemorrhagic shock Variceal bleed- post banding  P: PPI/octreotide Trend H/H, bleeding seems to have stopped, transfuse for less than 7  Hx cirrhosis 2/2 EtOH Portal gastropathy P: Continue home lactulose Empiric CTX TIP is being considered, he had refused in the past, seen by IR will need to reassess when awake and able to consent again    Hx Systolic congestive heart failure (EF 50-55% with WMA 02/04/23), HoTN CAD, HTN, HLD P: -Continuous telemetry  -Continue home Midodrine -Hold home Rosuvastatin, Spironolactone, -Torsemide -Lasix 40 daily  Hx adrenal insufficiency  -Stress steroids in lieu  of home Cortef (  daily)  Acute Metabolic encephalopathy Hx polysubstance abuse including EtOH - Thiamine/folate, check ammonia - Monitor for s/s of w/d   Best Practice (right click and "Reselect all SmartList Selections" daily)   Diet/type: NPO DVT prophylaxis: SCD GI prophylaxis: PPI Lines: Central line Foley:  N/A Code Status:   full code Last date of multidisciplinary goals of care discussion: 04/08/23    My independent critical care time was 35 minutes  Cyril Mourning MD. [facility]. [place] [facility] Pager : [phone]  If no response to pager , please call [phone] until 7 pm After 7:00 pm call Elink  [phone]   04/10/2023

## 2023-04-10 NOTE — Progress Notes (Addendum)
Sister of patient, Ethan Wells, has called several times today wanting updates. She is not on his patient contact list. This nurse explained that she would have to have the POA call us and add names to contact list or create password. POA has called 2 times and I have tried to reconnect with her was not successful. Will pass information on to night shift in case she returns call.

## 2023-04-11 ENCOUNTER — Inpatient Hospital Stay (HOSPITAL_COMMUNITY): Payer: Medicare HMO | Admitting: Certified Registered Nurse Anesthetist

## 2023-04-11 ENCOUNTER — Encounter (HOSPITAL_COMMUNITY)
Admission: RE | Disposition: A | Discharge status: Home / Self Care | Payer: Self-pay | Source: Other Acute Inpatient Hospital | Attending: Internal Medicine

## 2023-04-11 ENCOUNTER — Inpatient Hospital Stay (HOSPITAL_COMMUNITY): Payer: Medicare HMO

## 2023-04-11 DIAGNOSIS — I8501 Esophageal varices with bleeding: Secondary | ICD-10-CM | POA: Diagnosis not present

## 2023-04-11 DIAGNOSIS — K766 Portal hypertension: Secondary | ICD-10-CM

## 2023-04-11 DIAGNOSIS — I509 Heart failure, unspecified: Secondary | ICD-10-CM

## 2023-04-11 DIAGNOSIS — R008 Other abnormalities of heart beat: Secondary | ICD-10-CM

## 2023-04-11 DIAGNOSIS — K922 Gastrointestinal hemorrhage, unspecified: Secondary | ICD-10-CM | POA: Diagnosis not present

## 2023-04-11 DIAGNOSIS — K920 Hematemesis: Secondary | ICD-10-CM | POA: Diagnosis not present

## 2023-04-11 DIAGNOSIS — D649 Anemia, unspecified: Secondary | ICD-10-CM | POA: Diagnosis not present

## 2023-04-11 DIAGNOSIS — F17210 Nicotine dependence, cigarettes, uncomplicated: Secondary | ICD-10-CM

## 2023-04-11 DIAGNOSIS — K209 Esophagitis, unspecified without bleeding: Secondary | ICD-10-CM | POA: Diagnosis not present

## 2023-04-11 DIAGNOSIS — K7460 Unspecified cirrhosis of liver: Secondary | ICD-10-CM

## 2023-04-11 DIAGNOSIS — I2510 Atherosclerotic heart disease of native coronary artery without angina pectoris: Secondary | ICD-10-CM

## 2023-04-11 DIAGNOSIS — I110 Hypertensive heart disease with heart failure: Secondary | ICD-10-CM

## 2023-04-11 HISTORY — PX: RADIOLOGY WITH ANESTHESIA: SHX6223

## 2023-04-11 HISTORY — PX: IR TIPS: IMG2295

## 2023-04-11 HISTORY — PX: IR US GUIDE VASC ACCESS RIGHT: IMG2390

## 2023-04-11 LAB — CBC
HCT: 27.8 % — ABNORMAL LOW (ref 39.0–52.0)
Hemoglobin: 9.2 g/dL — ABNORMAL LOW (ref 13.0–17.0)
MCH: 29.8 pg (ref 26.0–34.0)
MCHC: 33.1 g/dL (ref 30.0–36.0)
MCV: 90 fL (ref 80.0–100.0)
Platelets: 61 10*3/uL — ABNORMAL LOW (ref 150–400)
RBC: 3.09 MIL/uL — ABNORMAL LOW (ref 4.22–5.81)
RDW: 17.5 % — ABNORMAL HIGH (ref 11.5–15.5)
WBC: 6.6 10*3/uL (ref 4.0–10.5)
nRBC: 0 % (ref 0.0–0.2)

## 2023-04-11 LAB — BASIC METABOLIC PANEL
Anion gap: 10 (ref 5–15)
Anion gap: 7 (ref 5–15)
BUN: 14 mg/dL (ref 6–20)
BUN: 10 mg/dL (ref 6–20)
CO2: 25 mmol/L (ref 22–32)
CO2: 26 mmol/L (ref 22–32)
Calcium: 7.8 mg/dL — ABNORMAL LOW (ref 8.9–10.3)
Calcium: 7.5 mg/dL — ABNORMAL LOW (ref 8.9–10.3)
Chloride: 106 mmol/L (ref 98–111)
Chloride: 106 mmol/L (ref 98–111)
Creatinine, Ser: 0.71 mg/dL (ref 0.61–1.24)
Creatinine, Ser: 0.73 mg/dL (ref 0.61–1.24)
GFR, Estimated: >60 mL/min (ref >60)
GFR, Estimated: >60 mL/min (ref >60)
Glucose, Bld: 109 mg/dL — ABNORMAL HIGH (ref 70–99)
Glucose, Bld: 160 mg/dL — ABNORMAL HIGH (ref 70–99)
Potassium: 2.8 mmol/L — ABNORMAL LOW (ref 3.5–5.1)
Potassium: 3.3 mmol/L — ABNORMAL LOW (ref 3.5–5.1)
Sodium: 141 mmol/L (ref 135–145)
Sodium: 139 mmol/L (ref 135–145)

## 2023-04-11 LAB — PHOSPHORUS: Phosphorus: 1.7 mg/dL — ABNORMAL LOW (ref 2.5–4.6)

## 2023-04-11 LAB — ECHOCARDIOGRAM COMPLETE
Area-P 1/2: 4.6 cm2
Calc EF: 55.7 %
Height: 65 in
S' Lateral: 4.6 cm
Single Plane A2C EF: 55.4 %
Single Plane A4C EF: 54.4 %
Weight: 3156.99 oz

## 2023-04-11 LAB — GLUCOSE, CAPILLARY
Glucose-Capillary: 86 mg/dL (ref 70–99)
Glucose-Capillary: 134 mg/dL — ABNORMAL HIGH (ref 70–99)
Glucose-Capillary: 170 mg/dL — ABNORMAL HIGH (ref 70–99)
Glucose-Capillary: 146 mg/dL — ABNORMAL HIGH (ref 70–99)
Glucose-Capillary: 132 mg/dL — ABNORMAL HIGH (ref 70–99)

## 2023-04-11 LAB — COMPREHENSIVE METABOLIC PANEL
ALT: 17 U/L (ref 0–44)
AST: 35 U/L (ref 15–41)
Albumin: 2.3 g/dL — ABNORMAL LOW (ref 3.5–5.0)
Alkaline Phosphatase: 109 U/L (ref 38–126)
Anion gap: 8 (ref 5–15)
BUN: 12 mg/dL (ref 6–20)
CO2: 25 mmol/L (ref 22–32)
Calcium: 7.5 mg/dL — ABNORMAL LOW (ref 8.9–10.3)
Chloride: 106 mmol/L (ref 98–111)
Creatinine, Ser: 0.74 mg/dL (ref 0.61–1.24)
GFR, Estimated: >60 mL/min (ref >60)
Glucose, Bld: 133 mg/dL — ABNORMAL HIGH (ref 70–99)
Potassium: 3.3 mmol/L — ABNORMAL LOW (ref 3.5–5.1)
Sodium: 139 mmol/L (ref 135–145)
Total Bilirubin: 2.5 mg/dL — ABNORMAL HIGH (ref 0.3–1.2)
Total Protein: 5.5 g/dL — ABNORMAL LOW (ref 6.5–8.1)

## 2023-04-11 LAB — TYPE AND SCREEN
ABO/RH(D): A POS
Antibody Screen: NEGATIVE

## 2023-04-11 LAB — MAGNESIUM: Magnesium: 2.2 mg/dL (ref 1.7–2.4)

## 2023-04-11 LAB — PROTIME-INR
INR: 1.5 — ABNORMAL HIGH (ref 0.8–1.2)
Prothrombin Time: 17.5 seconds — ABNORMAL HIGH (ref 11.4–15.2)

## 2023-04-11 SURGERY — IR WITH ANESTHESIA
Anesthesia: General

## 2023-04-11 MED ORDER — PERFLUTREN LIPID MICROSPHERE
1.0000 mL | INTRAVENOUS | Status: AC | PRN
  Administered 2023-04-11: 3 mL via INTRAVENOUS

## 2023-04-11 MED ORDER — PROPOFOL 500 MG/50ML IV EMUL
INTRAVENOUS | Status: DC | PRN
  Administered 2023-04-11: 80 ug/kg/min via INTRAVENOUS
  Administered 2023-04-11: 75 ug/kg/min via INTRAVENOUS

## 2023-04-11 MED ORDER — FENTANYL CITRATE (PF) 250 MCG/5ML IJ SOLN
INTRAMUSCULAR | Status: DC | PRN
  Administered 2023-04-11: 50 ug via INTRAVENOUS
  Administered 2023-04-11: 100 ug via INTRAVENOUS
  Administered 2023-04-11: 50 ug via INTRAVENOUS
  Administered 2023-04-11: 100 ug via INTRAVENOUS

## 2023-04-11 MED ORDER — VASOPRESSIN 20 UNIT/ML IV SOLN
INTRAVENOUS | Status: AC
  Filled 2023-04-11: qty 1

## 2023-04-11 MED ORDER — LACTATED RINGERS IV SOLN: INTRAVENOUS | Status: DC

## 2023-04-11 MED ORDER — IOHEXOL 300 MG/ML  SOLN
150.0000 mL | Freq: Once | INTRAMUSCULAR | Status: AC | PRN
  Administered 2023-04-11: 100 mL via INTRAVENOUS

## 2023-04-11 MED ORDER — POTASSIUM PHOSPHATES 15 MMOLE/5ML IV SOLN
30.0000 mmol | Freq: Once | INTRAVENOUS | Status: AC
  Administered 2023-04-11: 30 mmol via INTRAVENOUS
  Filled 2023-04-11: qty 10

## 2023-04-11 MED ORDER — LIDOCAINE HCL 1 % IJ SOLN
INTRAMUSCULAR | Status: AC
  Filled 2023-04-11: qty 20

## 2023-04-11 MED ORDER — SODIUM CHLORIDE 0.9 % IV SOLN: INTRAVENOUS | Status: DC | PRN

## 2023-04-11 MED ORDER — FENTANYL CITRATE (PF) 100 MCG/2ML IJ SOLN
INTRAMUSCULAR | Status: AC
  Filled 2023-04-11: qty 4

## 2023-04-11 MED ORDER — CHLORHEXIDINE GLUCONATE 0.12 % MT SOLN: 15.0000 mL | Freq: Once | OROMUCOSAL | Status: AC

## 2023-04-11 MED ORDER — VANCOMYCIN HCL IN DEXTROSE 1-5 GM/200ML-% IV SOLN
INTRAVENOUS | Status: AC
  Filled 2023-04-11: qty 200

## 2023-04-11 MED ORDER — FENTANYL CITRATE (PF) 100 MCG/2ML IJ SOLN
INTRAMUSCULAR | Status: AC
  Filled 2023-04-11: qty 2

## 2023-04-11 MED ORDER — ALBUMIN HUMAN 5 % IV SOLN: INTRAVENOUS | Status: DC | PRN

## 2023-04-11 MED ORDER — POTASSIUM CHLORIDE CRYS ER 20 MEQ PO TBCR
40.0000 meq | EXTENDED_RELEASE_TABLET | Freq: Once | ORAL | Status: AC
  Administered 2023-04-11: 40 meq via ORAL
  Filled 2023-04-11: qty 2

## 2023-04-11 MED ORDER — IOHEXOL 300 MG/ML  SOLN
100.0000 mL | Freq: Once | INTRAMUSCULAR | Status: AC | PRN
  Administered 2023-04-11: 45 mL via INTRAVENOUS

## 2023-04-11 MED ORDER — FUROSEMIDE 10 MG/ML IJ SOLN
20.0000 mg | Freq: Once | INTRAMUSCULAR | Status: AC
  Administered 2023-04-11: 20 mg via INTRAVENOUS
  Filled 2023-04-11: qty 2

## 2023-04-11 MED ORDER — KETAMINE HCL 50 MG/5ML IJ SOSY
PREFILLED_SYRINGE | INTRAMUSCULAR | Status: AC
  Filled 2023-04-11: qty 5

## 2023-04-11 MED ORDER — ROCURONIUM BROMIDE 10 MG/ML (PF) SYRINGE
PREFILLED_SYRINGE | INTRAVENOUS | Status: DC | PRN
  Administered 2023-04-11: 50 mg via INTRAVENOUS
  Administered 2023-04-11: 30 mg via INTRAVENOUS
  Administered 2023-04-11: 50 mg via INTRAVENOUS
  Administered 2023-04-11: 20 mg via INTRAVENOUS

## 2023-04-11 MED ORDER — POTASSIUM CHLORIDE CRYS ER 20 MEQ PO TBCR
20.0000 meq | EXTENDED_RELEASE_TABLET | Freq: Once | ORAL | Status: AC
  Administered 2023-04-11: 20 meq via ORAL
  Filled 2023-04-11: qty 1

## 2023-04-11 MED ORDER — DEXAMETHASONE SODIUM PHOSPHATE 10 MG/ML IJ SOLN
INTRAMUSCULAR | Status: DC | PRN
  Administered 2023-04-11: 4 mg via INTRAVENOUS

## 2023-04-11 MED ORDER — PROPOFOL 10 MG/ML IV BOLUS
INTRAVENOUS | Status: DC | PRN
  Administered 2023-04-11: 20 mg via INTRAVENOUS

## 2023-04-11 MED ORDER — PROPOFOL 1000 MG/100ML IV EMUL
5.0000 ug/kg/min | INTRAVENOUS | Status: DC
  Administered 2023-04-12 (×3): 40 ug/kg/min via INTRAVENOUS
  Administered 2023-04-12 (×2): 75 ug/kg/min via INTRAVENOUS
  Administered 2023-04-12: 80 ug/kg/min via INTRAVENOUS
  Administered 2023-04-12: 40 ug/kg/min via INTRAVENOUS
  Administered 2023-04-13 (×2): 30 ug/kg/min via INTRAVENOUS
  Administered 2023-04-13 (×2): 40 ug/kg/min via INTRAVENOUS
  Administered 2023-04-14: 20 ug/kg/min via INTRAVENOUS
  Administered 2023-04-14: 30 ug/kg/min via INTRAVENOUS
  Filled 2023-04-11 (×9): qty 100
  Filled 2023-04-11: qty 200
  Filled 2023-04-11 (×3): qty 100

## 2023-04-11 MED ORDER — CHLORHEXIDINE GLUCONATE 0.12 % MT SOLN
OROMUCOSAL | Status: AC
  Administered 2023-04-11: 30 mL
  Filled 2023-04-11: qty 15

## 2023-04-11 MED ORDER — TRAMADOL HCL 50 MG PO TABS
50.0000 mg | ORAL_TABLET | Freq: Three times a day (TID) | ORAL | Status: DC | PRN
  Administered 2023-04-11: 50 mg via ORAL
  Filled 2023-04-11: qty 1

## 2023-04-11 MED ORDER — ORAL CARE MOUTH RINSE: 15.0000 mL | Freq: Once | OROMUCOSAL | Status: AC

## 2023-04-11 MED ORDER — VANCOMYCIN HCL 1000 MG IV SOLR
INTRAVENOUS | Status: DC | PRN
  Administered 2023-04-11: 1000 mg via INTRAVENOUS

## 2023-04-11 MED ORDER — ONDANSETRON HCL 4 MG/2ML IJ SOLN
INTRAMUSCULAR | Status: DC | PRN
  Administered 2023-04-11: 4 mg via INTRAVENOUS

## 2023-04-11 MED ORDER — PHENYLEPHRINE HCL-NACL 20-0.9 MG/250ML-% IV SOLN
INTRAVENOUS | Status: DC | PRN
  Administered 2023-04-11: 20 ug/min via INTRAVENOUS

## 2023-04-11 MED ORDER — VANCOMYCIN HCL IN DEXTROSE 1-5 GM/200ML-% IV SOLN
1000.0000 mg | Freq: Once | INTRAVENOUS | Status: AC
  Administered 2023-04-11: 1000 mg via INTRAVENOUS
  Filled 2023-04-11: qty 200

## 2023-04-11 MED ORDER — LACTATED RINGERS IV SOLN: INTRAVENOUS | Status: DC | PRN

## 2023-04-11 MED ORDER — SUCCINYLCHOLINE CHLORIDE 200 MG/10ML IV SOSY
PREFILLED_SYRINGE | INTRAVENOUS | Status: DC | PRN
  Administered 2023-04-11: 120 mg via INTRAVENOUS

## 2023-04-11 NOTE — Progress Notes (Signed)
[facility] ADULT ICU REPLACEMENT PROTOCOL   The patient does apply for the [facility] Adult ICU Electrolyte Replacment Protocol based on the criteria listed below:   1.Exclusion criteria: TCTS, ECMO, Dialysis, and Myasthenia Gravis patients 2. Is GFR >/= 30 ml/min? Yes.    Patient's GFR today is >60 3. Is SCr </= 2? Yes.   Patient's SCr is 0.71 mg/dL 4. Did SCr increase >/= 0.5 in 24 hours? No. 5.Pt's weight >40kg  Yes.   6. Abnormal electrolyte(s):   K 2.8, Phos 1.7  7. Electrolytes replaced per protocol 8.  Call MD STAT for K+ </= 2.5, Phos </= 1, or Mag </= 1 Physician:  Alfonso Ramus R Jeree Delcid 04/11/2023 4:52 AM

## 2023-04-11 NOTE — Transfer of Care (Signed)
Immediate Anesthesia Transfer of Care Note  [name]  Procedure(s) Performed: IR WITH ANESTHESIA  Patient Location: ICU  Anesthesia Type:General  Level of Consciousness: Patient remains intubated per anesthesia plan  Airway & Oxygen Therapy: Patient placed on Ventilator (see vital sign flow sheet for setting)  Post-op Assessment: Report given to RN and Post -op Vital signs reviewed and stable  Post vital signs: Reviewed and stable  Last Vitals:  Vitals Value Taken Time  BP 121/74   Temp    Pulse 87   Resp    SpO2 100 % 04/11/23 2237    Last Pain:  Vitals:   04/11/23 1617  TempSrc:   PainSc: Asleep      Patients Stated Pain Goal: 4 (04/08/23 1230)  Complications: No notable events documented.

## 2023-04-11 NOTE — Evaluation (Signed)
Physical Therapy Evaluation Patient Details Name: Ethan Wells MRN: [id number] DOB: 07-29-1971 Today's Date: 04/11/2023  History of Present Illness  Ethan Wells is a 52 yo male presented to [facility] ED 4/22 for acute GI bleed. Of note patient was seen within this health sytem 1 month ago for significant GI bleed requiring banding per GI with TIPS procedure recommended but patient declined. Pt transferred to [facility] due to sepsis resulting in VDRF 4/23-4/25.  Patient reports last drink and cocaine use prior to admit was day prior to admission.  Plan for TIPS procedure 4/26 in pm. Pt  PMH significant for recurrent GI bleed with known variceal bleed, ischemic cardiomyopathy, EtOH abuse, polysubstance abuse, HTN, HLD, depression, and anxiety  Clinical Impression  Pt admitted with above diagnosis. Pt was able to take a few steps to chair with mod assist of 2 for safety as pt is Impulsive with decr safety awarneess overall. Pt should be able to progress and go to group home with HHPT f/u. Has RW but states it is older.  Will follow acutely.  Pt currently with functional limitations due to the deficits listed below (see PT Problem List). Pt will benefit from acute skilled PT to increase their independence and safety with mobility to allow discharge.          Recommendations for follow up therapy are one component of a multi-disciplinary discharge planning process, led by the attending physician.  Recommendations may be updated based on patient status, additional functional criteria and insurance authorization.  Follow Up Recommendations       Assistance Recommended at Discharge Frequent or constant Supervision/Assistance  Patient can return home with the following  A little help with walking and/or transfers;A little help with bathing/dressing/bathroom;Assistance with cooking/housework;Assist for transportation;Help with stairs or ramp for entrance    Equipment Recommendations  (requests new  RW- may benefit from rollator)  Recommendations for Other Services       Functional Status Assessment Patient has had a recent decline in their functional status and demonstrates the ability to make significant improvements in function in a reasonable and predictable amount of time.     Precautions / Restrictions Precautions Precautions: Fall Restrictions Weight Bearing Restrictions: No      Mobility  Bed Mobility Overal bed mobility: Needs Assistance Bed Mobility: Supine to Sit     Supine to sit: Mod assist, +2 for physical assistance     General bed mobility comments: Pt needed mod assist to get up to EOB with use of pad and pt impulsive needing assist to slow down. Uses momentum to move.    Transfers Overall transfer level: Needs assistance Equipment used: Rolling walker (2 wheels) Transfers: Sit to/from Stand, Bed to chair/wheelchair/BSC Sit to Stand: Mod assist, +2 physical assistance, From elevated surface Stand pivot transfers: Mod assist, +2 physical assistance         General transfer comment: Pt needs mod assist of 2 for safety as he moves impulsively and needed cues for safety and guidance of RW as well as cues for posture as he tends to flex forward causing decr safety. Also sat abruptly needing assist to control descent.    Ambulation/Gait                  Stairs            Wheelchair Mobility    Modified Rankin (Stroke Patients Only)       Balance Overall balance assessment: Needs assistance Sitting-balance support: Feet supported,  Bilateral upper extremity supported Sitting balance-Leahy Scale: Poor Sitting balance - Comments: relies intermisttently on UEs to balance sitting eOB   Standing balance support: Bilateral upper extremity supported, During functional activity, Reliant on assistive device for balance Standing balance-Leahy Scale: Poor Standing balance comment: Needed UE and external support                              Pertinent Vitals/Pain Pain Assessment Pain Assessment: Faces Faces Pain Scale: Hurts whole lot Pain Location: generalized chronic pain Pain Descriptors / Indicators: Aching, Grimacing, Guarding Pain Intervention(s): Limited activity within patient's tolerance, Monitored during session, Repositioned, Patient requesting pain meds-RN notified    Home Living Family/patient expects to be discharged to:: Private residence Living Arrangements: Alone   Type of Home:  (Boarding room) Home Access: Stairs to enter Entrance Stairs-Rails: Lawyer of Steps: 5-6   Home Layout: Able to live on main level with bedroom/bathroom Home Equipment: Cane - single Librarian, academic (2 wheels);Shower seat      Prior Function Prior Level of Function : Independent/Modified Independent;Driving             Mobility Comments: Uses SPC/RW for mobility ADLs Comments: Pt reports doing "the best I can" with ADLs/IADLs. Reports needing assist for IADLs but does not have help available.     Hand Dominance   Dominant Hand: Right    Extremity/Trunk Assessment   Upper Extremity Assessment Upper Extremity Assessment: Defer to OT evaluation    Lower Extremity Assessment Lower Extremity Assessment: Generalized weakness    Cervical / Trunk Assessment Cervical / Trunk Assessment: Kyphotic  Communication   Communication: No difficulties  Cognition Arousal/Alertness: Awake/alert Behavior During Therapy: WFL for tasks assessed/performed Overall Cognitive Status: Within Functional Limits for tasks assessed                                 General Comments: Decr safety and pt impulsive; pt needed encouragement to get up with PT        General Comments General comments (skin integrity, edema, etc.): VSS    Exercises General Exercises - Lower Extremity Ankle Circles/Pumps: AROM, Both, 10 reps, Supine Long Arc Quad: AROM, Both, 10 reps, Seated    Assessment/Plan    PT Assessment Patient needs continued PT services  PT Problem List Decreased activity tolerance;Decreased balance;Decreased mobility;Decreased knowledge of use of DME;Decreased safety awareness;Decreased knowledge of precautions;Cardiopulmonary status limiting activity       PT Treatment Interventions DME instruction;Gait training;Functional mobility training;Therapeutic activities;Therapeutic exercise;Balance training;Patient/family education;Stair training    PT Goals (Current goals can be found in the Care Plan section)  Acute Rehab PT Goals Patient Stated Goal: to go back to group home PT Goal Formulation: With patient Time For Goal Achievement: 04/25/23 Potential to Achieve Goals: Good    Frequency Min 3X/week     Co-evaluation               AM-PAC PT "6 Clicks" Mobility  Outcome Measure Help needed turning from your back to your side while in a flat bed without using bedrails?: Total Help needed moving from lying on your back to sitting on the side of a flat bed without using bedrails?: Total Help needed moving to and from a bed to a chair (including a wheelchair)?: Total Help needed standing up from a chair using your arms (e.g., wheelchair or bedside chair)?: Total  Help needed to walk in hospital room?: Total Help needed climbing 3-5 steps with a railing? : Total 6 Click Score: 6    End of Session Equipment Utilized During Treatment: Gait belt Activity Tolerance: Patient limited by fatigue Patient left: in chair;with call bell/phone within reach;with chair alarm set Nurse Communication: Mobility status;Patient requests pain meds PT Visit Diagnosis: Unsteadiness on feet (R26.81);Muscle weakness (generalized) (M62.81)    Time: 1610-9604 PT Time Calculation (min) (ACUTE ONLY): 25 min   Charges:   PT Evaluation $PT Eval Moderate Complexity: 1 Mod PT Treatments $Therapeutic Activity: 8-22 mins        [facility] M,PT Acute Rehab  Services [phone]   Bevelyn Buckles 04/11/2023, 4:33 PM

## 2023-04-11 NOTE — Discharge Instructions (Signed)
Outpatient Providers   [facility] Group and individual counseling. [address]  [place], [place] [address] [phone] [place]: [phone]  [place]: [phone] Medicaid and uninsured.   [facility] Offers IOP groups multiple times per week. [address], [place], [place] [address] [phone] Takes Medicaid and other insurances.   [facility]  Chemical Dependency Intensive Outpatient Program (IOP) [address] [place], [place] [address] [phone] Takes commercial insurance and Medicare.   [facility] and Partial Hospitalization Program  [address]  [place], [place] [address] [phone] Private Insurance, Medicaid only for partial hospitalization     [facility] ([facility] IOP, individual counseling, medication management [address] [place], [place] [address] [phone] Medicaid and Uninsured  [facility] [address]  [place], [place] [address] [phone] Private Insurance and Self Pay   [place] [facility] [address]  [place], [place] [address] [phone] Private Insurance, Medicaid, and Self Pay   [facility]  [address]  [place], [place] [address] [facility]  [address]  [place], [place] [address] [phone]  [facility]  [address] [place], [place] [address] [phone]       Residential Treatment Programs  [facility] [address] [place], [place] [address] [phone] or [phone] Detox and Residential Rehab 14 days (Medicare, Medicaid, private insurance, and self pay)  [facility] [address]  [place], [place] [address] [phone] Detox (self Pay and Medicaid Limited availability) Rehab Only Male (Medicare, Medicaid,  and Self Pay)  [facility] [address] [place], [place] [address] [phone] or [phone] Private Insurance only  [facility]  [address]  [place], [place] [address] [phone] Treatment Only, must make assessment appointment, and must be sober for assessment appointment. Self pay, Medicare A and B, [place] Medicaid, must be [place] resident.      Residential Treatment Programs  [facility] [address]  [place] , [place]  [phone] Commercial insurance, Medicare, Medicaid. They offer assistance with transportation.   [facility] [address],  [place], [place] [address] [phone] Christian Based Program No insurance     [facility]  [address] , [place] [address] [phone] No pending legal charges, Long-term work program  [facility]  [address]  [place], [place] [address] [address], [place], [place] [address] Residential treatment (takes people on Methadone/Suboxone)  Medicaid and uninsured  [facility]  [address], [place], [place] [address] [phone] or [phone] [employer] Insurance Only [facility] - [phone] Admissions--[phone] Private Insurance Males/Females, call to make referrals, multiple facilities   [facility] [address],  [place], [place] [address]  [phone] Men Only Upfront Fee [facility] [address]      [facility]: [facility] [address] [place], [place] [address] [phone]  [facility] [address] [place], [place] [address] [phone] (p) [phone] (f)  [facility] [address] [place], [place] [address] [phone] (p) [phone] (f)       Syringe Services Program Due to COVID-19, syringe services programs are likely operating under different hours with    limited or no fixed site hours. Some programs may not be operating at all. Please contact the program directly using the phone numbers provided below to see if they are still operating under COVID-19.  [place] [facility] ([facility]) Fixed; mobile; peer-based Tyler Yates [phone] [email or web address] Fixed site exchange at [employer], [address] [place], [place] [address] on Wednesdays (2:00 - 5:00 pm) and Thursdays (4:00 - 8:00 pm). Pop-up mobile exchange locations: [facility], [address] [place], [place] [address] on Tuesdays (11:00 am - 1:00 pm) and Fridays (11:00 am - 1:00 pm) [facility] - [address], [place], [place] [address] on Tuesdays (2:00 - 4:00 pm) and Fridays (2:00 - 4:00 pm) [facility] - also serves [place] and [place]  Access Fixed; mobile; peer-based Louise Vincent [phone] [email or web address] [address] [place],  [address] Delivery and outreach available in [place] and [place], please call for more information. Monday, Tuesday: 1:00 -7:00 pm Thursday: 4:00 pm - 8:00 pm Friday: 1:00 pm - 8:00 pm)  

## 2023-04-11 NOTE — Anesthesia Procedure Notes (Signed)
Arterial Line Insertion Start/End4/26/2024 4:40 PM, 04/11/2023 4:47 PM Performed by: Heather Roberts, MD, Alwyn Ren, CRNA, CRNA  Patient location: Pre-op. Preanesthetic checklist: patient identified, IV checked, site marked, risks and benefits discussed, surgical consent, monitors and equipment checked, pre-op evaluation, timeout performed and anesthesia consent Lidocaine 1% used for infiltration radial was placed Catheter size: 20 G Hand hygiene performed  and maximum sterile barriers used   Attempts: 1 Procedure performed using ultrasound guided technique. Ultrasound Notes:anatomy identified, needle tip was noted to be adjacent to the nerve/plexus identified and no ultrasound evidence of intravascular and/or intraneural injection Following insertion, dressing applied. Post procedure assessment: normal and unchanged  Patient tolerated the procedure well with no immediate complications. Additional procedure comments: Left arm slightly edematous but with [place] radial artery easily identified. Placed first attempt.Marland Kitchen

## 2023-04-11 NOTE — Consult Note (Signed)
Chief Complaint: Recurrent GI Bleed. Request is for TIPS procedure.  Referring Physician(s): Mumma,Shannon  Supervising Physician: Roanna Banning  Patient Status: [facility] - In-pt  History of Present Illness: [facility] is a 52 y.o. male inpatient. History of CHF, Barrett's esophagus, alcoholic cirrhosis s/p banding of his esophageal varices in March 2024. At that point of time the  plan was for TIPS. Patient refused procedure. He has ongoing alcohol and cocaine use. He was suggested to follow-up subsequently for repeat variceal banding but did not go so. Patient presented to the ED at [facility] with significant GI bleed with hematemesis and melena. Found to have hypotension and lactic acidosis. Patient was transferred to [facility]. EGD performed on 4.23.24 shows Barrett' esophagus, grade 2 esophageal varices with stigmata ( banded X 2) total hypertensive gastropathy and early non bleeding gastric varices. Patient seen at bedside with IR Attending Dr. Roanna Banning. Patient drowsy but alert and oriented X 4. Verbalizing that he would like to proceed with TIPS at this time. Plan is for TIPS procedure with general anesthesia.  Patient drowsy but alert and sitting up in bed,calm. Denies any overt complaints. Return precautions and treatment recommendations and follow-up discussed with the patient  who is agreeable with the plan.    Past Medical History:  Diagnosis Date   CAD (coronary artery disease)    a. 01/2011 Anterior STEMI/Cath/PCI: LM nl, LAD 100d (3.5x85mm Vision BMS placed), LCX 82m, RI 50, RCA min irregs, EF 40% w/ apical, inferoapical HK; b. 10/2022 NSTSEMI/Cath: LM nl, LAD 5p/m, RI 85, LCX 17m, OM1 mild dzs, OM3 100 fills via L->L collats from dLAD, RCA 80p, 173m, RPDA fills via collats from LAD. EF 45-50%-->Med Rx.   Cardiac arrest - ventricular fibrillation    a. In setting of STEMI 01/2011   Chronic pain    Cocaine abuse (HCC)    Depression with anxiety    ETOH abuse    a. 6-12  beers/day   GERD (gastroesophageal reflux disease)    GI bleed    Hemorrhoids    Hyperlipidemia    Hypertension    Ischemic cardiomyopathy    a. 06/2011 Echo: EF 45-50%, No rwma; b. 10/2022 Echo: EF 55-60%, no rwma, nl RV fxn.   Marijuana abuse    Migraines    Tobacco abuse    a. 1/2 ppd x 26 yrs    Past Surgical History:  Procedure Laterality Date   COLONOSCOPY  08/09/2021   Procedure: COLONOSCOPY;  Surgeon: Midge Minium, MD;  Location: [facility] ENDOSCOPY;  Service: Endoscopy;;   CORONARY ANGIOPLASTY WITH STENT PLACEMENT     ESOPHAGOGASTRODUODENOSCOPY N/A 05/30/2022   Procedure: ESOPHAGOGASTRODUODENOSCOPY (EGD);  Surgeon: Toney Reil, MD;  Location: [facility] ENDOSCOPY;  Service: Gastroenterology;  Laterality: N/A;   ESOPHAGOGASTRODUODENOSCOPY (EGD) WITH PROPOFOL N/A 03/01/2021   Procedure: ESOPHAGOGASTRODUODENOSCOPY (EGD) WITH PROPOFOL;  Surgeon: Toney Reil, MD;  Location: [facility]GERY CN[facility];  Service: Endoscopy;  Laterality: N/A;   ESOPHAGOGASTRODUODENOSCOPY (EGD) WITH PROPOFOL N/A 08/09/2021   Procedure: ESOPHAGOGASTRODUODENOSCOPY (EGD) WITH PROPOFOL;  Surgeon: Midge Minium, MD;  Location: [facility] ENDOSCOPY;  Service: Endoscopy;  Laterality: N/A;   ESOPHAGOGASTRODUODENOSCOPY (EGD) WITH PROPOFOL N/A 03/01/2023   Procedure: ESOPHAGOGASTRODUODENOSCOPY (EGD) WITH PROPOFOL;  Surgeon: Jaynie Collins, DO;  Location: [facility] ENDOSCOPY;  Service: Gastroenterology;  Laterality: N/A;   EVALUATION UNDER ANESTHESIA WITH HEMORRHOIDECTOMY N/A 10/31/2022   Procedure: EXAM UNDER ANESTHESIA WITH SUTURE LIGATION OF TWO BLEEDING PEDICLES;  Surgeon: Henrene Dodge, MD;  Location: [facility] ORS;  Service: General;  Laterality: N/A;   FLEXIBLE SIGMOIDOSCOPY N/A 10/02/2022   Procedure: FLEXIBLE SIGMOIDOSCOPY;  Surgeon: Meryl Dare, MD;  Location: [facility] ENDOSCOPY;  Service: Gastroenterology;  Laterality: N/A;   LEFT HEART CATH AND CORONARY ANGIOGRAPHY N/A 11/04/2022   Procedure: LEFT HEART CATH AND  CORONARY ANGIOGRAPHY;  Surgeon: Iran Ouch, MD;  Location: [facility] INVASIVE CV LAB;  Service: Cardiovascular;  Laterality: N/A;   VISCERAL ANGIOGRAPHY N/A 10/24/2022   Procedure: VISCERAL ANGIOGRAPHY;  Surgeon: Annice Needy, MD;  Location: [facility] INVASIVE CV LAB;  Service: Cardiovascular;  Laterality: N/A;    Allergies: Penicillins and Other  Medications: Prior to Admission medications   Medication Sig Start Date End Date Taking? Authorizing Provider  acetaminophen (TYLENOL) 500 MG tablet Take 1 tablet (500 mg total) by mouth every 8 (eight) hours as needed for mild pain, fever or headache. 03/05/23   [name], MD  gabapentin (NEURONTIN) 300 MG capsule Take 1 capsule (300 mg total) by mouth 3 (three) times daily. 03/05/23 06/03/23  [name], MD  hydrocortisone (CORTEF) 10 MG tablet Take 1 tablet (10 mg total) by mouth daily. 03/05/23 03/04/24  [name], MD  hydrocortisone (CORTEF) 5 MG tablet Take 1 tablet (5 mg total) by mouth daily after lunch. 03/05/23 03/04/24  [name], MD  ibuprofen (ADVIL) 800 MG tablet Take 800 mg by mouth every 8 (eight) hours as needed. 12/21/22   [provider]  lactulose (CHRONULAC) 10 GM/15ML solution Take 30 mLs (20 g total) by mouth 2 (two) times daily. Titrate to 1-2 BM per day 03/05/23   [name], MD  methocarbamol (ROBAXIN) 750 MG tablet Take 1 tablet (750 mg total) by mouth daily as needed for muscle spasms. 03/05/23   [name], MD  midodrine (PROAMATINE) 10 MG tablet Take 1 tablet (10 mg total) by mouth 3 (three) times daily with meals. Skip the dose if Systolic BP greater than 110 mmHg and or HR greater than 100 03/05/23 06/03/23  [name], MD  Multiple Vitamin (MULTIVITAMIN WITH MINERALS) TABS tablet Take 1 tablet by mouth daily. 03/06/23 03/05/24  [name], MD  nitroGLYCERIN (NITROSTAT) 0.4 MG SL tablet Place 1 tablet (0.4 mg total) under the tongue every 5 (five) minutes as needed for chest pain. 03/05/23   [name],  MD  pantoprazole (PROTONIX) 40 MG tablet Take 1 tablet (40 mg total) by mouth 2 (two) times daily. 03/05/23 03/04/24  [name], MD  QUEtiapine (SEROQUEL) 25 MG tablet Take 1 tablet (25 mg total) by mouth at bedtime. 03/05/23 06/03/23  [name], MD  rosuvastatin (CRESTOR) 40 MG tablet Take 1 tablet (40 mg total) by mouth daily. 03/05/23 03/04/24  [name], MD  spironolactone (ALDACTONE) 25 MG tablet Take 0.5 tablets (12.5 mg total) by mouth daily. 03/06/23 06/04/23  [name], MD  torsemide 40 MG TABS Take 40 mg by mouth 2 (two) times daily for 14 days, THEN 40 mg daily. Patient not taking: Reported on 04/07/2023 03/05/23 03/18/24  [name], MD     Family History  Problem Relation Age of Onset   Coronary artery disease Mother        alive   Other Other        2 sisters alive and well   Alcohol abuse Father        died @ 20   Other Paternal Grandmother        PACER    Social History   Socioeconomic History   Marital status: Single  Spouse name: Not on file   Number of children: Not on file   Years of education: Not on file   Highest education level: Not on file  Occupational History   Not on file  Tobacco Use   Smoking status: Every Day    Packs/day: 0.50    Years: 41.00    Additional pack years: 0.00    Total pack years: 20.50    Types: Cigarettes   Smokeless tobacco: Never   Tobacco comments:    started age 70  Vaping Use   Vaping Use: Never used  Substance and Sexual Activity   Alcohol use: Yes    Alcohol/week: 12.0 standard drinks of alcohol    Types: 12 Cans of beer per week    Comment: at least a 6 pack of beer daily and often a 12 pack   Drug use: Yes    Types: Cocaine, Marijuana   Sexual activity: Not on file  Other Topics Concern   Not on file  Social History Narrative   Single, lives in [place] with his mother.  He does not routinely exercise.  He sometimes works, helping to deliver appliances to homes.   Social Determinants of Health    Financial Resource Strain: Not on file  Food Insecurity: No Food Insecurity (04/07/2023)   Hunger Vital Sign    Worried About Running Out of Food in the Last Year: Never true    Ran Out of Food in the Last Year: Never true  Transportation Needs: No Transportation Needs (04/07/2023)   PRAPARE - Administrator, Civil Service (Medical): No    Lack of Transportation (Non-Medical): No  Physical Activity: Not on file  Stress: Not on file  Social Connections: Not on file    Review of Systems: A 12 point ROS discussed and pertinent positives are indicated in the HPI above.  All other systems are negative.  Review of Systems  Constitutional:  Negative for fever.  HENT:  Negative for congestion.   Respiratory:  Negative for cough and shortness of breath.   Cardiovascular:  Negative for chest pain.  Gastrointestinal:  Negative for abdominal pain.  Neurological:  Negative for headaches.  Psychiatric/Behavioral:  Negative for behavioral problems and confusion.     Vital Signs: BP 122/70   Pulse 82   Temp 98.3 F (36.8 C) (Oral)   Resp 20   Ht 5\' 5"  (1.651 m)   Wt 197 lb 5 oz (89.5 kg)   SpO2 93%   BMI 32.83 kg/m     Physical Exam Vitals and nursing note reviewed.  Constitutional:      Appearance: He is well-developed.  HENT:     Head: Normocephalic.  Cardiovascular:     Rate and Rhythm: Normal rate and regular rhythm.     Heart sounds: Normal heart sounds.  Pulmonary:     Effort: Pulmonary effort is normal.     Breath sounds: Normal breath sounds.  Abdominal:     General: There is distension.  Musculoskeletal:        General: Normal range of motion.     Cervical back: Normal range of motion.  Skin:    General: Skin is warm and dry.  Neurological:     Mental Status: He is alert and oriented to person, place, and time.  Psychiatric:        Mood and Affect: Mood normal.        Behavior: Behavior normal.  Thought Content: Thought content normal.         Judgment: Judgment normal.     Imaging: ECHOCARDIOGRAM COMPLETE  Result Date: 04/11/2023    ECHOCARDIOGRAM REPORT   Patient Name:   NYZAIAH KAI Date of Exam: 04/11/2023 Medical Rec #:  [id number]           Height:       65.0 in Accession #:    [id number]          Weight:       197.3 lb Date of Birth:  February 19, 1971           BSA:          1.967 m Patient Age:    51 years            BP:           115/68 mmHg Patient Gender: M                   HR:           89 bpm. Exam Location:  Inpatient Procedure: 2D Echo, Cardiac Doppler, Color Doppler and Intracardiac            Opacification Agent Indications:    Other abnormalities of the heart R00.8  History:        Patient has prior history of Echocardiogram examinations. CAD;                 Risk Factors:Hypertension and Dyslipidemia.  Sonographer:    Mike Gip Referring Phys: [phone] Lebaron Bautch C Roswell Ndiaye IMPRESSIONS  1. Left ventricular ejection fraction, by estimation, is 55 to 60%. The left ventricle has normal function. The left ventricle has no regional wall motion abnormalities. There is mild left ventricular hypertrophy. Left ventricular diastolic parameters are consistent with Grade II diastolic dysfunction (pseudonormalization).  2. Right ventricular systolic function is normal. The right ventricular size is normal. There is normal pulmonary artery systolic pressure.  3. Anterior leaflet has increased thickening, likely calcification. Mild mitral valve regurgitation.  4. The aortic valve is tricuspid. Aortic valve regurgitation is not visualized.  5. The inferior vena cava is normal in size with greater than 50% respiratory variability, suggesting right atrial pressure of 3 mmHg. FINDINGS  Left Ventricle: Left ventricular ejection fraction, by estimation, is 55 to 60%. The left ventricle has normal function. The left ventricle has no regional wall motion abnormalities. Definity contrast agent was given IV to delineate the left ventricular   endocardial borders. The left ventricular internal cavity size was normal in size. There is mild left ventricular hypertrophy. Left ventricular diastolic parameters are consistent with Grade II diastolic dysfunction (pseudonormalization). Right Ventricle: The right ventricular size is normal. Right ventricular systolic function is normal. There is normal pulmonary artery systolic pressure. The tricuspid regurgitant velocity is 2.54 m/s, and with an assumed right atrial pressure of 3 mmHg,  the estimated right ventricular systolic pressure is 28.8 mmHg. Left Atrium: Left atrial size was normal in size. Right Atrium: Right atrial size was normal in size. Pericardium: There is no evidence of pericardial effusion. Mitral Valve: Anterior leaflet has increased thickening, likely calcification. Mild mitral valve regurgitation. Tricuspid Valve: Tricuspid valve regurgitation is mild. Aortic Valve: The aortic valve is tricuspid. Aortic valve regurgitation is not visualized. Pulmonic Valve: Pulmonic valve regurgitation is not visualized. Aorta: The aortic root and ascending aorta are structurally normal, with no evidence of dilitation. Venous: The inferior vena cava is normal in size with greater  than 50% respiratory variability, suggesting right atrial pressure of 3 mmHg. IAS/Shunts: No atrial level shunt detected by color flow Doppler.  LEFT VENTRICLE PLAX 2D LVIDd:         5.80 cm      Diastology LVIDs:         4.60 cm      LV e' medial:    4.35 cm/s LV PW:         1.10 cm      LV E/e' medial:  22.0 LV IVS:        1.00 cm      LV e' lateral:   8.59 cm/s LVOT diam:     2.20 cm      LV E/e' lateral: 11.1 LV SV:         87 LV SV Index:   44 LVOT Area:     3.80 cm  LV Volumes (MOD) LV vol d, MOD A2C: 156.0 ml LV vol d, MOD A4C: 149.0 ml LV vol s, MOD A2C: 69.6 ml LV vol s, MOD A4C: 68.0 ml LV SV MOD A2C:     86.4 ml LV SV MOD A4C:     149.0 ml LV SV MOD BP:      87.6 ml RIGHT VENTRICLE             IVC RV Basal diam:  4.20 cm      IVC diam: 1.80 cm RV S prime:     12.30 cm/s TAPSE (M-mode): 2.0 cm LEFT ATRIUM             Index        RIGHT ATRIUM           Index LA diam:        3.90 cm 1.98 cm/m   RA Area:     20.40 cm LA Vol (A2C):   66.0 ml 33.56 ml/m  RA Volume:   55.90 ml  28.42 ml/m LA Vol (A4C):   59.3 ml 30.15 ml/m LA Biplane Vol: 62.5 ml 31.78 ml/m  AORTIC VALVE LVOT Vmax:   102.00 cm/s LVOT Vmean:  68.700 cm/s LVOT VTI:    0.229 m  AORTA Ao Root diam: 3.30 cm Ao Asc diam:  3.10 cm MITRAL VALVE               TRICUSPID VALVE MV Area (PHT): 4.60 cm    TR Peak grad:   25.8 mmHg MV Decel Time: 165 msec    TR Vmax:        254.00 cm/s MV E velocity: 95.50 cm/s MV A velocity: 32.60 cm/s  SHUNTS MV E/A ratio:  2.93        Systemic VTI:  0.23 m                            Systemic Diam: 2.20 cm Carolan Clines Electronically signed by Carolan Clines Signature Date/Time: 04/11/2023/10:06:14 AM    Final    DG Chest Port 1 View  Result Date: 04/11/2023 CLINICAL DATA:  5626 Acute respiratory failure 5626 EXAM: PORTABLE CHEST 1 VIEW COMPARISON:  Chest XR, 04/09/2023.  CT AP, 01/17/2023. FINDINGS: Support lines: Interval extubation. LEFT IJ CVC with catheter tip well-positioned at the superior cavoatrial junction. Cardiomegaly. Perihilar and central bronchial wall thickening. Minimal bibasilar interstitial opacities. No pleural effusion or pneumothorax. No interval osseous abnormality. IMPRESSION: 1. Extubation.  Stable, well-positioned LEFT IJ-approach CVC. 2. Cardiomegaly with perihilar thickening and  minimal basilar opacities. Findings favored to represent mild pulmonary edema and atelectasis. Electronically Signed   By: Roanna Banning M.D.   On: 04/11/2023 07:30   DG Chest Port 1 View  Result Date: 04/09/2023 CLINICAL DATA:  Hypoxia EXAM: PORTABLE CHEST 1 VIEW COMPARISON:  04/09/2023 FINDINGS: Endotracheal tube is again noted and stable. Left jugular central line are again seen in satisfactory position. Significant improvement in the  degree of congestive failure is noted. Some residual basilar opacities are seen. No bony abnormality is noted. IMPRESSION: Near complete resolution of previously seen pulmonary edema. Electronically Signed   By: Alcide Clever M.D.   On: 04/09/2023 12:12   DG CHEST PORT 1 VIEW  Result Date: 04/09/2023 CLINICAL DATA:  Respiratory failure, prone repositioning EXAM: PORTABLE CHEST 1 VIEW COMPARISON:  04/08/2023 FINDINGS: Endotracheal tube is seen 14 mm above the carina. Left internal jugular central venous catheter tip noted overlying the expected superior vena cava. Pulmonary insufflation is stable. Superimposed extensive bilateral perihilar and lower lung zone consolidation in keeping with severe alveolar pulmonary edema or diffuse pneumonic infiltrate has progressed in the interval since prior examination. No pneumothorax or pleural effusion. Cardiac size within normal limits. IMPRESSION: 1. Endotracheal tube 14 mm above the carina. 2. Progressive bilateral perihilar and lower lung zone consolidation, severe alveolar pulmonary edema or diffuse pneumonic infiltrate. Electronically Signed   By: Helyn Numbers M.D.   On: 04/09/2023 03:01   DG CHEST PORT 1 VIEW  Result Date: 04/08/2023 CLINICAL DATA:  Hypoxia EXAM: PORTABLE CHEST 1 VIEW COMPARISON:  04/07/2023 FINDINGS: The endotracheal tube has been placed with tip measuring 3.8 cm above the carina. Left central venous catheter with tip over the low SVC region. No pneumothorax. Cardiac enlargement with bilateral perihilar infiltrates, progressing since prior study, likely edema. No pleural effusions. No pneumothorax. Mediastinal contours appear intact. IMPRESSION: 1. Appliances appear in satisfactory position. 2. Cardiac enlargement with perihilar infiltration increasing since prior study, likely edema. Electronically Signed   By: Burman Nieves M.D.   On: 04/08/2023 22:04   DG Chest Port 1 View  Result Date: 04/07/2023 CLINICAL DATA:  Hematemesis and  abdominal pain EXAM: PORTABLE CHEST 1 VIEW COMPARISON:  01/18/2023 FINDINGS: Unchanged cardiomegaly. No new focal pulmonary opacity. Chronic coarsened interstitial markings without overt pulmonary edema. No pleural effusion or pneumothorax. No acute osseous abnormality. IMPRESSION: Unchanged cardiomegaly. No acute cardiopulmonary process. Electronically Signed   By: Wiliam Ke M.D.   On: 04/07/2023 11:27    Labs:  CBC: Recent Labs    04/09/23 2310 04/10/23 0856 04/10/23 2306 04/11/23 0857  WBC 15.1* 8.7 7.6 6.6  HGB 10.5* 9.8* 9.0* 9.2*  HCT 31.0* 30.9* 27.2* 27.8*  PLT 114* 73* 65* 61*    COAGS: Recent Labs    01/17/23 0043 01/18/23 0204 02/22/23 0450 02/28/23 0445 03/02/23 0330 03/03/23 0340 03/04/23 0640 04/07/23 1026  INR 1.3*   < > 1.6*   < > 1.3* 1.4* 1.3* 1.6*  APTT 37*  --  33  --   --   --   --  34   < > = values in this interval not displayed.    BMP: Recent Labs    04/08/23 2215 04/08/23 2324 04/09/23 0533 04/09/23 1230 04/10/23 0528 04/11/23 0359  NA 141   < > 141 144 145 141  K 2.8*   < > 3.1* 2.9* 3.3* 2.8*  CL 102  --  106  --  110 106  CO2 24  --  24  --  27 25  GLUCOSE 212*  --  174*  --  144* 109*  BUN 14  --  16  --  22* 14  CALCIUM 8.2*  --  7.5*  --  7.7* 7.8*  CREATININE 0.82  --  0.91  --  0.95 0.71  GFRNONAA >60  --  >60  --  >60 >60   < > = values in this interval not displayed.    LIVER FUNCTION TESTS: Recent Labs    04/07/23 2110 04/08/23 2215 04/09/23 0533 04/10/23 0528  BILITOT 2.6* 3.6* 2.5* 2.2*  AST 47* 49* 37 38  ALT 19 19 17 17   ALKPHOS 144* 180* 131* 124  PROT 5.6* 7.0 5.6* 5.6*  ALBUMIN 2.3* 2.8* 2.3* 2.3*     Assessment and Plan:  52 y.o. male inpatient. History of CHF, Barrett's esophagus, alcoholic cirrhosis s/p banding of his esophageal varices in March 2024. At that point of time the  plan was for TIPS. Patient refused procedure. He has ongoing alcohol and cocaine use. He was suggested to follow-up  subsequently for repeat variceal banding but did not go so. Patient presented to the ED at [facility] with significant GI bleed with hematemesis and melena. Found to have hypotension and lactic acidosis. Patient was transferred to [facility]. EGD performed on 4.23.24 shows Barrett' esophagus, grade 2 esophageal varices with stigmata ( banded X 2) total hypertensive gastropathy and early non bleeding gastric varices. Patient seen at bedside with IR Attending Dr. Roanna Banning. Patient drowsy but alert and oriented X 4. Verbalizing that he would like to proceed with TIPS at this time. Plan is for TIPS procedure with general anesthesia  NA+ MELD - 15, Potassium 2.8, Hgb 9.2. Echo pending. Allergies include PCN. Patient drinking clear liquids @ 0900. Made NPO at this time   Risks and benefits of TIPS, BRTO and/or additional variceal embolization were discussed with the patient and/or the patient's family including, but not limited to, infection, bleeding, damage to adjacent structures, worsening hepatic and/or cardiac function, worsening and/or the development of altered mental status/encephalopathy, non-target embolization and death.   This interventional procedure involves the use of X-rays and because of the nature of the planned procedure, it is possible that we will have prolonged use of X-ray fluoroscopy.  Potential radiation risks to you include (but are not limited to) the following: - A slightly elevated risk for cancer  several years later in life. This risk is typically less than 0.5% percent. This risk is low in comparison to the normal incidence of human cancer, which is 33% for women and 50% for men according to the American Cancer Society. - Radiation induced injury can include skin redness, resembling a rash, tissue breakdown / ulcers and hair loss (which can be temporary or permanent).   The likelihood of either of these occurring depends on the difficulty of the procedure and whether you are sensitive  to radiation due to previous procedures, disease, or genetic conditions.   IF your procedure requires a prolonged use of radiation, you will be notified and given written instructions for further action.  It is your responsibility to monitor the irradiated area for the 2 weeks following the procedure and to notify your physician if you are concerned that you have suffered a radiation induced injury.    All of the patient's questions were answered, patient is agreeable to proceed.  Consent signed and in chart.       Thank you for this interesting consult.  I greatly  enjoyed meeting WAYLIN DORKO and look forward to participating in their care.  A copy of this report was sent to the requesting provider on this date.  Electronically Signed: Alene Mires, NP 04/11/2023, 10:55 AM   I spent a total of 40 Minutes    in face to face in clinical consultation, greater than 50% of which was counseling/coordinating care for TIPS

## 2023-04-11 NOTE — Procedures (Signed)
Vascular and Interventional Radiology Procedure Note  [name] DOB: 1971-09-24 Medical Record Number: [id number] Note Date/Time: 04/11/23 10:03 PM   Performing Physician: Roanna Banning, MD Assistant(s): Malachy Moan, MD  Diagnosis: cirrhosis with pHTN and refractory variceal bleeding  Procedure(s):  TRANSJUGULAR INTRAHEPATIC PORTOSYSTEMIC SHUNT (TIPS)  INTRACARDIAC ECHOCARDIOGRAPHY (ICE)   Anesthesia: General Anesthesia Complications: None Estimated Blood Loss:  25 mL Specimens: None  Findings:  - access via the RIGHT jugular and femoral veins. - Challenging case with aborted ICE-guided TIPS. - Portal access via direct intrahepatic technique (DIPS) with placement of 7 cm Viatorr stent - Reduction of portal HTN with a completion portosystemic gradient of 7 mmHg    Plan: - Bedrest x2 hrs and RLE straight. No HOB restrictions. - Lactulose TID and monitor for hepatic encephalopathy. - NH3 in AM - Additional management per medical ICU and GI Teams  Final report to follow once all images are reviewed and compared with previous studies.  See detailed dictation with images in PACS. The patient tolerated the procedure well without incident or complication and was returned to ICU in stable condition.    Roanna Banning, MD [facility]   Pager. [phone] Clinic. [phone]

## 2023-04-11 NOTE — Anesthesia Procedure Notes (Signed)
Procedure Name: Intubation Date/Time: 04/11/2023 5:42 PM  Performed by: Cheree Ditto, CRNAPre-anesthesia Checklist: Patient identified, Emergency Drugs available, Suction available and Patient being monitored Patient Re-evaluated:Patient Re-evaluated prior to induction Oxygen Delivery Method: Circle system utilized Preoxygenation: Pre-oxygenation with 100% oxygen Induction Type: IV induction, Rapid sequence and Cricoid Pressure applied Ventilation: Mask ventilation without difficulty Laryngoscope Size: Glidescope and 3 Grade View: Grade I Tube type: Oral Number of attempts: 1 Airway Equipment and Method: Stylet, Oral airway and Video-laryngoscopy Placement Confirmation: ETT inserted through vocal cords under direct vision, positive ETCO2 and breath sounds checked- equal and bilateral Secured at: 21 cm Tube secured with: Tape Dental Injury: Teeth and Oropharynx as per pre-operative assessment

## 2023-04-11 NOTE — Progress Notes (Addendum)
Progress Note  Primary GI: Dr. Allegra Lai  LOS: 4 days   Chief Complaint:GI bleed   Subjective  Patient is extubated, complaining of abdominal pain and would like his pain medicine. Denies nausea/vomiting.  No family was present at the time of my evaluation.   Objective   Vital signs in last 24 hours: Temp:  [97.2 F (36.2 C)-98.6 F (37 C)] 98.6 F (37 C) (04/26 1128) Pulse Rate:  [56-94] 69 (04/26 1300) Resp:  [13-20] 17 (04/26 1300) BP: (90-140)/(57-81) 140/81 (04/26 1300) SpO2:  [90 %-95 %] 94 % (04/26 1300) Weight:  [89.5 kg] 89.5 kg (04/26 0500) Last BM Date : 04/08/23 Last BM recorded by nurses in past 5 days Stool Type: Type 6 (Mushy consistency with ragged edges) (04/08/2023  8:56 PM)  General:   male in no acute distress  Heart:  Regular rate and rhythm; no murmurs Pulm: Clear anteriorly; no wheezing Abdomen: soft, nondistended, normal bowel sounds in all quadrants. Nontender without guarding. No organomegaly appreciated. Neurologic:  Alert and  oriented x4;  No focal deficits.  Psych:  Cooperative. Normal mood and affect.  Intake/Output from previous day: 04/25 0701 - 04/26 0700 In: 733.9 [I.V.:277.6; IV Piggyback:456.4] Out: 735 [Urine:735] Intake/Output this shift: Total I/O In: 806.1 [IV Piggyback:806.1] Out: 1220 [Urine:1220]  Studies/Results: ECHOCARDIOGRAM COMPLETE  Result Date: 04/11/2023    ECHOCARDIOGRAM REPORT   Patient Name:   Ethan Wells Date of Exam: 04/11/2023 Medical Rec #:  [id number]           Height:       65.0 in Accession #:    [id number]          Weight:       197.3 lb Date of Birth:  30-Dec-1970           BSA:          1.967 m Patient Age:    51 years            BP:           115/68 mmHg Patient Gender: M                   HR:           89 bpm. Exam Location:  Inpatient Procedure: 2D Echo, Cardiac Doppler, Color Doppler and Intracardiac            Opacification Agent Indications:    Other abnormalities of the heart R00.8  History:         Patient has prior history of Echocardiogram examinations. CAD;                 Risk Factors:Hypertension and Dyslipidemia.  Sonographer:    Mike Gip Referring Phys: [phone] JENNIFER C OMOHUNDRO IMPRESSIONS  1. Left ventricular ejection fraction, by estimation, is 55 to 60%. The left ventricle has normal function. The left ventricle has no regional wall motion abnormalities. There is mild left ventricular hypertrophy. Left ventricular diastolic parameters are consistent with Grade II diastolic dysfunction (pseudonormalization).  2. Right ventricular systolic function is normal. The right ventricular size is normal. There is normal pulmonary artery systolic pressure.  3. Anterior leaflet has increased thickening, likely calcification. Mild mitral valve regurgitation.  4. The aortic valve is tricuspid. Aortic valve regurgitation is not visualized.  5. The inferior vena cava is normal in size with greater than 50% respiratory variability, suggesting right atrial pressure of 3 mmHg. FINDINGS  Left Ventricle: Left ventricular ejection fraction,  by estimation, is 55 to 60%. The left ventricle has normal function. The left ventricle has no regional wall motion abnormalities. Definity contrast agent was given IV to delineate the left ventricular  endocardial borders. The left ventricular internal cavity size was normal in size. There is mild left ventricular hypertrophy. Left ventricular diastolic parameters are consistent with Grade II diastolic dysfunction (pseudonormalization). Right Ventricle: The right ventricular size is normal. Right ventricular systolic function is normal. There is normal pulmonary artery systolic pressure. The tricuspid regurgitant velocity is 2.54 m/s, and with an assumed right atrial pressure of 3 mmHg,  the estimated right ventricular systolic pressure is 28.8 mmHg. Left Atrium: Left atrial size was normal in size. Right Atrium: Right atrial size was normal in size. Pericardium: There is no  evidence of pericardial effusion. Mitral Valve: Anterior leaflet has increased thickening, likely calcification. Mild mitral valve regurgitation. Tricuspid Valve: Tricuspid valve regurgitation is mild. Aortic Valve: The aortic valve is tricuspid. Aortic valve regurgitation is not visualized. Pulmonic Valve: Pulmonic valve regurgitation is not visualized. Aorta: The aortic root and ascending aorta are structurally normal, with no evidence of dilitation. Venous: The inferior vena cava is normal in size with greater than 50% respiratory variability, suggesting right atrial pressure of 3 mmHg. IAS/Shunts: No atrial level shunt detected by color flow Doppler.  LEFT VENTRICLE PLAX 2D LVIDd:         5.80 cm      Diastology LVIDs:         4.60 cm      LV e' medial:    4.35 cm/s LV PW:         1.10 cm      LV E/e' medial:  22.0 LV IVS:        1.00 cm      LV e' lateral:   8.59 cm/s LVOT diam:     2.20 cm      LV E/e' lateral: 11.1 LV SV:         87 LV SV Index:   44 LVOT Area:     3.80 cm  LV Volumes (MOD) LV vol d, MOD A2C: 156.0 ml LV vol d, MOD A4C: 149.0 ml LV vol s, MOD A2C: 69.6 ml LV vol s, MOD A4C: 68.0 ml LV SV MOD A2C:     86.4 ml LV SV MOD A4C:     149.0 ml LV SV MOD BP:      87.6 ml RIGHT VENTRICLE             IVC RV Basal diam:  4.20 cm     IVC diam: 1.80 cm RV S prime:     12.30 cm/s TAPSE (M-mode): 2.0 cm LEFT ATRIUM             Index        RIGHT ATRIUM           Index LA diam:        3.90 cm 1.98 cm/m   RA Area:     20.40 cm LA Vol (A2C):   66.0 ml 33.56 ml/m  RA Volume:   55.90 ml  28.42 ml/m LA Vol (A4C):   59.3 ml 30.15 ml/m LA Biplane Vol: 62.5 ml 31.78 ml/m  AORTIC VALVE LVOT Vmax:   102.00 cm/s LVOT Vmean:  68.700 cm/s LVOT VTI:    0.229 m  AORTA Ao Root diam: 3.30 cm Ao Asc diam:  3.10 cm MITRAL VALVE  TRICUSPID VALVE MV Area (PHT): 4.60 cm    TR Peak grad:   25.8 mmHg MV Decel Time: 165 msec    TR Vmax:        254.00 cm/s MV E velocity: 95.50 cm/s MV A velocity: 32.60 cm/s   SHUNTS MV E/A ratio:  2.93        Systemic VTI:  0.23 m                            Systemic Diam: 2.20 cm Carolan Clines Electronically signed by Carolan Clines Signature Date/Time: 04/11/2023/10:06:14 AM    Final    DG Chest Port 1 View  Result Date: 04/11/2023 CLINICAL DATA:  5626 Acute respiratory failure 5626 EXAM: PORTABLE CHEST 1 VIEW COMPARISON:  Chest XR, 04/09/2023.  CT AP, 01/17/2023. FINDINGS: Support lines: Interval extubation. LEFT IJ CVC with catheter tip well-positioned at the superior cavoatrial junction. Cardiomegaly. Perihilar and central bronchial wall thickening. Minimal bibasilar interstitial opacities. No pleural effusion or pneumothorax. No interval osseous abnormality. IMPRESSION: 1. Extubation.  Stable, well-positioned LEFT IJ-approach CVC. 2. Cardiomegaly with perihilar thickening and minimal basilar opacities. Findings favored to represent mild pulmonary edema and atelectasis. Electronically Signed   By: Roanna Banning M.D.   On: 04/11/2023 07:30    Lab Results: Recent Labs    04/10/23 0856 04/10/23 2306 04/11/23 0857  WBC 8.7 7.6 6.6  HGB 9.8* 9.0* 9.2*  HCT 30.9* 27.2* 27.8*  PLT 73* 65* 61*   BMET Recent Labs    04/10/23 0528 04/11/23 0359 04/11/23 1002  NA 145 141 139  K 3.3* 2.8* 3.3*  CL 110 106 106  CO2 27 25 25   GLUCOSE 144* 109* 133*  BUN 22* 14 12  CREATININE 0.95 0.71 0.74  CALCIUM 7.7* 7.8* 7.5*   LFT Recent Labs    04/11/23 1002  PROT 5.5*  ALBUMIN 2.3*  AST 35  ALT 17  ALKPHOS 109  BILITOT 2.5*   PT/INR Recent Labs    04/11/23 1002  LABPROT 17.5*  INR 1.5*     Scheduled Meds:  Chlorhexidine Gluconate Cloth  6 each Topical Daily   folic acid  1 mg Intravenous Daily   gabapentin  300 mg Oral TID   hydrocortisone sod succinate (SOLU-CORTEF) inj  50 mg Intravenous Daily   insulin aspart  0-15 Units Subcutaneous Q4H   lactulose  30 g Oral BID   midodrine  10 mg Oral TID WC   multivitamin with minerals  1 tablet Oral Daily    pantoprazole  40 mg Intravenous Q12H   QUEtiapine  25 mg Oral QHS   thiamine  100 mg Oral Daily   Or   thiamine  100 mg Intravenous Daily   Continuous Infusions:    Patient profile:   52 y.o. y/o male with history of possible alcoholic liver cirrhosis underwent banding of his esophageal varices in March 2024. At that point of time plan was for TIPS. Patient refused procedure . He has ongoing alcohol and cocaine use. He was suggested to follow-up subsequently for repeat variceal banding but did not do so. Presents to the hospital with a significant upper GI bleed with hematemesis and melena associated hypotension and lactic acidosis    Impression:   GI bleed; secondary to esophageal varices in the setting of alcoholic cirrhosis - EGD 4/23: Known Barrett's esophagus, grade 2 esophageal varices with stigmata of recent bleeding, banded x 2.  Portal hypertensive gastropathy.  Early nonbleeding gastric varices.  No fresh blood seen - hgb 9.2, decreased - MELD 3.0:16   Systolic congestive heart failure EtOH abuse.  Cocaine and marijuana abuse    Plan:   - no further bleeding, hgb slightly decreased. Continue to monitor hgb - non-selective beta blocker after 5 days of octreotide. - repeat EGD in 3 weeks for additional banding - continue supportive care - repeat ECHO prior to consideration of TIPs   Jayron Maqueda M Shardae Kleinman  04/11/2023, 1:20 PM

## 2023-04-11 NOTE — Progress Notes (Signed)
PROGRESS NOTE Ethan Wells  ONG:[id number] DOB: 04/20/1971 DOA: 04/07/2023 PCP: [name], NP  Brief Narrative/Hospital Course: 52 yom w/ history of recurrent GI bleed with known variceal bleed, ischemic cardiomyopathy, EtOH abuse, polysubstance abuse, HTN, HLD, depression, and anxiety presented to [facility] ED 4/22 for acute GI bleed. Of note patient was seen within this health sytem 1 month ago for significant GI bleed requiring banding per GI with TIPS procedure recommended but patient declined.Patient reports last drink and cocaine use was day prior to admission. In ED: He was hypotensive and mildly tachycardic. Lab work significant for K 2.3, glucoses 140, Mg 1.2, alkaline phos 153, albumin 2.3, AST 44, total protein 5.6, total bili 1.6, lactic 5.6, hgb 7.1, and hct 23.2. Given active bleed with compromised hemodynamics, PCCM consulted for further management and transferred to [facility] and admitted to ICU on 4/22   4/23 underwent EGD showing grade 2 varices, banded x 2, portal gastropathy> was intubated on 4/23 for flash pulmonary edema given Lasix and was proned, needed Precedex drip, not pressors. 4/25 patient extubated after diuresis 4/26: Transferred to [facility] service patient agreed for TIPS IR consulted by [facility] 04/11/23, echo done for pre-op    Subjective: Seen and examined this morning Alert awake weak-appearing, echo is being done for TIPS-which he agreed Some lower abdominal discomfort present. Past 24 hours patient has been afebrile on room air and BP in low 100 Labs with potassium 2.8 at 4 AM this morning mag normal with phosphorus 1.7> replacement ordered with potassium phosphate 30 mm, K-Dur 40 x 1 Blood culture from 4/22 NGTD Discussed with PCCM team/rn  Assessment and Plan: Principal Problem:   GI bleed Active Problems:   Anemia due to GI blood loss   Secondary esophageal varices with bleeding (HCC)   Esophageal varices with bleeding in diseases classified elsewhere  [facility])   Acute metabolic encephalopathy   Acute upper GI esophageal variceal bleeding in the setting of alcohol cirrhosis History of recurrent GI bleed History of cirrhosis due to alcohol abuse Portal gastropathy: EGD 4/23 showed Barrett's esophagus, grade 2 esophageal varices with stigmata of recent bleeding, banded x 2, also with portal hypertensive gastropathy, early nonbleeding gastric ulcers.  Monitor hemoglobin, watch for bleeding, continue PPI, empiric ceftriaxone. Repeat EGD in 3 weeks for additional banding. He PRN for TIPS and IR evaluated today echo being done preop continue plan per IR, GI > discussed with PCCM team patient will be monitored in ICU postprocedure due to risk of acute hepatic encephalopathy.  Continue alcohol cessation discussion, continue lactulose  Hypokalemia Hypophosphatemia: Replaced with K-Phos and K-Dur.  Recheck labs and monitor  Recent Labs  Lab 04/07/23 1026 04/07/23 2110 04/08/23 0250 04/08/23 1359 04/08/23 2215 04/08/23 2324 04/09/23 0533 04/09/23 1230 04/10/23 0528 04/11/23 0359 04/11/23 1002  K 2.3*   < > 3.4*   < > 2.8*   < > 3.1* 2.9* 3.3* 2.8* 3.3*  CALCIUM 7.4*   < > 7.4*  --  8.2*  --  7.5*  --  7.7* 7.8* 7.5*  MG 1.2*  --  2.1  --   --   --   --   --  1.7 2.2  --   PHOS  --   --  2.4*  --   --   --   --   --  3.0 1.7*  --    < > = values in this interval not displayed.   Acute blood loss anemia due to upper GI bleeding  Hemorrhagic shock:: BP stable now.  Hemoglobin 6.2 and 1 time currently improved in 9 to 10 g range, received 3 units PRBC this admission continue to monitor  Thrombocytopenia in the setting of patient's liver cirrhosis monitor platelet count Coagulopathy with INR 1.6 due to liver cirrhosis monitor  Acute respiratory failure with hypoxia needing intubation 4/23, extubated on 4/24  Fluid overload with CHF with preserved EF  Hypertension CAD Hyperlipidemia: PTA on Crestor Aldactone torsemide. Lvef 50-55% In feb  2024> today  55-60%< no RWMA, Grade II diastolic dysfunction (pseudonormalization).Briefly diuresed in the ICU> additional Lasix dose given this am.Hopefully can resume torsemide and rest of the home meds slowly. BP soft, monitor net balance, weight as below  Net IO Since Admission: 121.76 mL [04/11/23 1318]  Filed Weights   04/09/23 0700 04/10/23 0500 04/11/23 0500  Weight: 91.6 kg 89.7 kg 89.5 kg    Substance abuse Etoh abuse: UDS positive for cocaine and benzo.Continue thiamine folate.Watch for alcohol withdrawal.  TOC consult  History of adrenal insufficiency: On stress dose steroids in lieu of home Cortef 25 mg daily, on midodrine  Class I Obesity:Patient's Body mass index is 32.83 kg/m. : Will benefit with PCP follow-up, weight loss  healthy lifestyle and outpatient sleep evaluation.   DVT prophylaxis: SCDs Start: 04/07/23 2045 Code Status:   Code Status: Full Code Family Communication: plan of care discussed with patient at bedside. Patient status is:  inpatient because of upper GI bleeding Level of care: ICU   Dispo: The patient is from: home            Anticipated disposition: TBD Objective: Vitals last 24 hrs: Vitals:   04/11/23 1100 04/11/23 1128 04/11/23 1200 04/11/23 1300  BP: 125/79 131/77  (!) 140/81  Pulse: 69 74 69 69  Resp: 18 18  17   Temp:  98.6 F (37 C)    TempSrc:  Oral    SpO2: 92% 92% 93% 94%  Weight:      Height:       Weight change: -0.2 kg  Physical Examination: General exam: alert awake, chronically sick looking, Old for his age HEENT:Oral mucosa moist, Ear/Nose WNL grossly Respiratory system: bilaterally diminished BS,no use of accessory muscle Cardiovascular system: S1 & S2 +, No JVD. Gastrointestinal system: Abdomen soft,NT,ND, BS+ Nervous System:Alert, awake, moving extremities. Extremities: LE edema mild,distal peripheral pulses palpable.  Skin: No rashes,no icterus. MSK: Normal muscle bulk,tone, power Foley+  Medications reviewed:   Scheduled Meds:  Chlorhexidine Gluconate Cloth  6 each Topical Daily   folic acid  1 mg Intravenous Daily   gabapentin  300 mg Oral TID   hydrocortisone sod succinate (SOLU-CORTEF) inj  50 mg Intravenous Daily   insulin aspart  0-15 Units Subcutaneous Q4H   lactulose  30 g Oral BID   midodrine  10 mg Oral TID WC   multivitamin with minerals  1 tablet Oral Daily   pantoprazole  40 mg Intravenous Q12H   QUEtiapine  25 mg Oral QHS   thiamine  100 mg Oral Daily   Or   thiamine  100 mg Intravenous Daily   Continuous Infusions:    Diet Order             Diet NPO time specified Except for: Sips with Meds  Diet effective now                   Intake/Output Summary (Last 24 hours) at 04/11/2023 1318 Last data filed at 04/11/2023 1300 Gross per  24 hour  Intake 1103.31 ml  Output 1895 ml  Net -791.69 ml   Net IO Since Admission: 121.76 mL [04/11/23 1318]  Wt Readings from Last 3 Encounters:  04/11/23 89.5 kg  04/07/23 91.2 kg  02/22/23 84.6 kg     Unresulted Labs (From admission, onward)     Start     Ordered   04/12/23 0500  Basic metabolic panel  Tomorrow morning,   R       Question:  Specimen collection method  Answer:  Unit=Unit collect   04/11/23 0452   04/12/23 0500  Magnesium  Tomorrow morning,   R       Question:  Specimen collection method  Answer:  Unit=Unit collect   04/11/23 0452   04/12/23 0500  Phosphorus  Tomorrow morning,   R       Question:  Specimen collection method  Answer:  Unit=Unit collect   04/11/23 0905   04/11/23 1500  Basic metabolic panel  Once,   R       Question:  Specimen collection method  Answer:  Unit=Unit collect   04/11/23 0907   04/10/23 0856  CBC  Now then every 12 hours,   R (with TIMED occurrences)     Question:  Specimen collection method  Answer:  Unit=Unit collect   04/10/23 0855          Data Reviewed: I have personally reviewed following labs and imaging studies CBC: Recent Labs  Lab 04/07/23 1026 04/07/23 2110  04/09/23 1508 04/09/23 2310 04/10/23 0856 04/10/23 2306 04/11/23 0857  WBC 10.2   < > 13.7* 15.1* 8.7 7.6 6.6  NEUTROABS 6.7  --   --   --   --   --   --   HGB 7.1*   < > 11.0* 10.5* 9.8* 9.0* 9.2*  HCT 23.2*   < > 32.3* 31.0* 30.9* 27.2* 27.8*  MCV 92.4   < > 87.8 87.6 90.9 90.7 90.0  PLT 157   < > 121* 114* 73* 65* 61*   < > = values in this interval not displayed.   Basic Metabolic Panel: Recent Labs  Lab 04/07/23 1026 04/07/23 2110 04/08/23 0250 04/08/23 1359 04/08/23 2215 04/08/23 2324 04/09/23 0533 04/09/23 1230 04/10/23 0528 04/11/23 0359 04/11/23 1002  NA 141   < > 141   < > 141   < > 141 144 145 141 139  K 2.3*   < > 3.4*   < > 2.8*   < > 3.1* 2.9* 3.3* 2.8* 3.3*  CL 102   < > 107   < > 102  --  106  --  110 106 106  CO2 27   < > 28  --  24  --  24  --  27 25 25   GLUCOSE 140*   < > 161*   < > 212*  --  174*  --  144* 109* 133*  BUN 8   < > 11   < > 14  --  16  --  22* 14 12  CREATININE 0.74   < > 0.67   < > 0.82  --  0.91  --  0.95 0.71 0.74  CALCIUM 7.4*   < > 7.4*  --  8.2*  --  7.5*  --  7.7* 7.8* 7.5*  MG 1.2*  --  2.1  --   --   --   --   --  1.7 2.2  --  PHOS  --   --  2.4*  --   --   --   --   --  3.0 1.7*  --    < > = values in this interval not displayed.   GFR: Estimated Creatinine Clearance: 113.6 mL/min (by C-G formula based on SCr of 0.74 mg/dL). Liver Function Tests: Recent Labs  Lab 04/07/23 2110 04/08/23 2215 04/09/23 0533 04/10/23 0528 04/11/23 1002  AST 47* 49* 37 38 35  ALT 19 19 17 17 17   ALKPHOS 144* 180* 131* 124 109  BILITOT 2.6* 3.6* 2.5* 2.2* 2.5*  PROT 5.6* 7.0 5.6* 5.6* 5.5*  ALBUMIN 2.3* 2.8* 2.3* 2.3* 2.3*   Recent Labs  Lab 04/07/23 1026  LIPASE 38   Recent Labs  Lab 04/09/23 1754  AMMONIA 75*   Coagulation Profile: Recent Labs  Lab 04/07/23 1026 04/11/23 1002  INR 1.6* 1.5*   Recent Labs    04/09/23 0533  TRIG 165*   Thyroid Function Tests: No results for input(s): "TSH", "T4TOTAL", "FREET4",  "T3FREE", "THYROIDAB" in the last 72 hours. Sepsis Labs: Recent Labs  Lab 04/07/23 1026 04/07/23 1346 04/07/23 2208  LATICACIDVEN 5.6* 3.7* 2.3*    Recent Results (from the past 240 hour(s))  Blood Culture (routine x 2)     Status: None (Preliminary result)   Collection Time: 04/07/23  1:46 PM   Specimen: BLOOD  Result Value Ref Range Status   Specimen Description BLOOD PORTA CATH  Final   Special Requests   Final    BOTTLES DRAWN AEROBIC AND ANAEROBIC Blood Culture adequate volume   Culture   Final    NO GROWTH 4 DAYS Performed at [facility], [address], [place], [place] [address]    Report Status PENDING  Incomplete  Blood Culture (routine x 2)     Status: None (Preliminary result)   Collection Time: 04/07/23  1:46 PM   Specimen: BLOOD  Result Value Ref Range Status   Specimen Description BLOOD PORTA CATH  Final   Special Requests   Final    BOTTLES DRAWN AEROBIC AND ANAEROBIC Blood Culture adequate volume   Culture   Final    NO GROWTH 4 DAYS Performed at [facility], [address] [place], [place] [address]    Report Status PENDING  Incomplete  MRSA Next Gen by PCR, Nasal     Status: None   Collection Time: 04/07/23  7:29 PM   Specimen: Nasal Mucosa; Nasal Swab  Result Value Ref Range Status   MRSA by PCR Next Gen NOT DETECTED NOT DETECTED Final    Comment: (NOTE) The GeneXpert MRSA Assay (FDA approved for NASAL specimens only), is one component of a comprehensive MRSA colonization surveillance program. It is not intended to diagnose MRSA infection nor to guide or monitor treatment for MRSA infections. Test performance is not FDA approved in patients less than 52 years old. Performed at [facility], [address], [place], [place] [address]     Antimicrobials: Anti-infectives (From admission, onward)    Start     Dose/Rate Route Frequency Ordered Stop   04/11/23 1045  vancomycin (VANCOCIN) IVPB 1000 mg/200 mL premix         1,000 mg 200 mL/hr over 60 Minutes Intravenous  Once 04/11/23 0952 04/11/23 1211   04/09/23 0830  cefTRIAXone (ROCEPHIN) 1 g in sodium chloride 0.9 % 100 mL IVPB  Status:  Discontinued        1 g 200 mL/hr  over 30 Minutes Intravenous Every 24 hours 04/08/23 0913 04/11/23 1133   04/08/23 1400  cefTRIAXone (ROCEPHIN) 2 g in sodium chloride 0.9 % 100 mL IVPB  Status:  Discontinued        2 g 200 mL/hr over 30 Minutes Intravenous Every 24 hours 04/07/23 2016 04/08/23 0742   04/08/23 0800  cefTRIAXone (ROCEPHIN) 2 g in sodium chloride 0.9 % 100 mL IVPB  Status:  Discontinued        2 g 200 mL/hr over 30 Minutes Intravenous Every 24 hours 04/08/23 0742 04/08/23 0913      Culture/Microbiology    Component Value Date/Time   SDES BLOOD PORTA CATH 04/07/2023 1346   SDES BLOOD PORTA CATH 04/07/2023 1346   SPECREQUEST  04/07/2023 1346    BOTTLES DRAWN AEROBIC AND ANAEROBIC Blood Culture adequate volume   SPECREQUEST  04/07/2023 1346    BOTTLES DRAWN AEROBIC AND ANAEROBIC Blood Culture adequate volume   CULT  04/07/2023 1346    NO GROWTH 4 DAYS Performed at [facility], [address] [place], [place] [address]    CULT  04/07/2023 1346    NO GROWTH 4 DAYS Performed at [facility], [address] [place] [place], [place] [address]    REPTSTATUS PENDING 04/07/2023 1346   REPTSTATUS PENDING 04/07/2023 1346    Other culture-see note  Radiology Studies: ECHOCARDIOGRAM COMPLETE  Result Date: 04/11/2023    ECHOCARDIOGRAM REPORT   Patient Name:   Ethan Wells Date of Exam: 04/11/2023 Medical Rec #:  [id number]           Height:       65.0 in Accession #:    [id number]          Weight:       197.3 lb Date of Birth:  14-Mar-1971           BSA:          1.967 m Patient Age:    51 years            BP:           115/68 mmHg Patient Gender: M                   HR:           89 bpm. Exam Location:  Inpatient Procedure: 2D Echo, Cardiac Doppler, Color Doppler and Intracardiac             Opacification Agent Indications:    Other abnormalities of the heart R00.8  History:        Patient has prior history of Echocardiogram examinations. CAD;                 Risk Factors:Hypertension and Dyslipidemia.  Sonographer:    Mike Gip Referring Phys: [id number] JENNIFER C OMOHUNDRO IMPRESSIONS  1. Left ventricular ejection fraction, by estimation, is 55 to 60%. The left ventricle has normal function. The left ventricle has no regional wall motion abnormalities. There is mild left ventricular hypertrophy. Left ventricular diastolic parameters are consistent with Grade II diastolic dysfunction (pseudonormalization).  2. Right ventricular systolic function is normal. The right ventricular size is normal. There is normal pulmonary artery systolic pressure.  3. Anterior leaflet has increased thickening, likely calcification. Mild mitral valve regurgitation.  4. The aortic valve is tricuspid. Aortic valve regurgitation is not visualized.  5. The inferior vena cava is normal in size with greater than 50% respiratory variability, suggesting right atrial pressure of 3  mmHg. FINDINGS  Left Ventricle: Left ventricular ejection fraction, by estimation, is 55 to 60%. The left ventricle has normal function. The left ventricle has no regional wall motion abnormalities. Definity contrast agent was given IV to delineate the left ventricular  endocardial borders. The left ventricular internal cavity size was normal in size. There is mild left ventricular hypertrophy. Left ventricular diastolic parameters are consistent with Grade II diastolic dysfunction (pseudonormalization). Right Ventricle: The right ventricular size is normal. Right ventricular systolic function is normal. There is normal pulmonary artery systolic pressure. The tricuspid regurgitant velocity is 2.54 m/s, and with an assumed right atrial pressure of 3 mmHg,  the estimated right ventricular systolic pressure is 28.8 mmHg. Left Atrium: Left atrial size was  normal in size. Right Atrium: Right atrial size was normal in size. Pericardium: There is no evidence of pericardial effusion. Mitral Valve: Anterior leaflet has increased thickening, likely calcification. Mild mitral valve regurgitation. Tricuspid Valve: Tricuspid valve regurgitation is mild. Aortic Valve: The aortic valve is tricuspid. Aortic valve regurgitation is not visualized. Pulmonic Valve: Pulmonic valve regurgitation is not visualized. Aorta: The aortic root and ascending aorta are structurally normal, with no evidence of dilitation. Venous: The inferior vena cava is normal in size with greater than 50% respiratory variability, suggesting right atrial pressure of 3 mmHg. IAS/Shunts: No atrial level shunt detected by color flow Doppler.  LEFT VENTRICLE PLAX 2D LVIDd:         5.80 cm      Diastology LVIDs:         4.60 cm      LV e' medial:    4.35 cm/s LV PW:         1.10 cm      LV E/e' medial:  22.0 LV IVS:        1.00 cm      LV e' lateral:   8.59 cm/s LVOT diam:     2.20 cm      LV E/e' lateral: 11.1 LV SV:         87 LV SV Index:   44 LVOT Area:     3.80 cm  LV Volumes (MOD) LV vol d, MOD A2C: 156.0 ml LV vol d, MOD A4C: 149.0 ml LV vol s, MOD A2C: 69.6 ml LV vol s, MOD A4C: 68.0 ml LV SV MOD A2C:     86.4 ml LV SV MOD A4C:     149.0 ml LV SV MOD BP:      87.6 ml RIGHT VENTRICLE             IVC RV Basal diam:  4.20 cm     IVC diam: 1.80 cm RV S prime:     12.30 cm/s TAPSE (M-mode): 2.0 cm LEFT ATRIUM             Index        RIGHT ATRIUM           Index LA diam:        3.90 cm 1.98 cm/m   RA Area:     20.40 cm LA Vol (A2C):   66.0 ml 33.56 ml/m  RA Volume:   55.90 ml  28.42 ml/m LA Vol (A4C):   59.3 ml 30.15 ml/m LA Biplane Vol: 62.5 ml 31.78 ml/m  AORTIC VALVE LVOT Vmax:   102.00 cm/s LVOT Vmean:  68.700 cm/s LVOT VTI:    0.229 m  AORTA Ao Root diam: 3.30 cm Ao Asc diam:  3.10 cm  MITRAL VALVE               TRICUSPID VALVE MV Area (PHT): 4.60 cm    TR Peak grad:   25.8 mmHg MV Decel Time: 165  msec    TR Vmax:        254.00 cm/s MV E velocity: 95.50 cm/s MV A velocity: 32.60 cm/s  SHUNTS MV E/A ratio:  2.93        Systemic VTI:  0.23 m                            Systemic Diam: 2.20 cm Carolan Clines Electronically signed by Carolan Clines Signature Date/Time: 04/11/2023/10:06:14 AM    Final    DG Chest Port 1 View  Result Date: 04/11/2023 CLINICAL DATA:  5626 Acute respiratory failure 5626 EXAM: PORTABLE CHEST 1 VIEW COMPARISON:  Chest XR, 04/09/2023.  CT AP, 01/17/2023. FINDINGS: Support lines: Interval extubation. LEFT IJ CVC with catheter tip well-positioned at the superior cavoatrial junction. Cardiomegaly. Perihilar and central bronchial wall thickening. Minimal bibasilar interstitial opacities. No pleural effusion or pneumothorax. No interval osseous abnormality. IMPRESSION: 1. Extubation.  Stable, well-positioned LEFT IJ-approach CVC. 2. Cardiomegaly with perihilar thickening and minimal basilar opacities. Findings favored to represent mild pulmonary edema and atelectasis. Electronically Signed   By: Roanna Banning M.D.   On: 04/11/2023 07:30     LOS: 4 days   Lanae Boast, MD [facility]  04/11/2023, 1:18 PM

## 2023-04-11 NOTE — Progress Notes (Signed)
Echocardiogram 2D Echocardiogram has been performed.  Toni Amend 04/11/2023, 9:39 AM

## 2023-04-11 NOTE — TOC Progression Note (Signed)
Transition of Care [facility]) - Initial/Assessment Note    Patient Details  Name: Ethan Wells MRN: [id number] Date of Birth: 03-09-1971  Transition of Care [facility]) CM/SW Contact:    Ralene Bathe, LCSWA Phone Number: 04/11/2023, 11:58 AM  Clinical Narrative:                 LCSW received SA consult and went to meet with patient at bedside. Personal care was being provided.  LCSW added SA resources to AVS.  TOC following.         Patient Goals and CMS Choice            Expected Discharge Plan and Services                                              Prior Living Arrangements/Services                       Activities of Daily Living      Permission Sought/Granted                  Emotional Assessment              Admission diagnosis:  GI bleed [K92.2] Patient Active Problem List   Diagnosis Date Noted   Esophageal varices with bleeding in diseases classified elsewhere (HCC) 04/09/2023   Acute metabolic encephalopathy 04/09/2023   Secondary esophageal varices with bleeding (HCC) 04/08/2023   Upper GI bleeding 04/07/2023   Leg edema 03/01/2023   GI bleeding 02/22/2023   HLD (hyperlipidemia) 02/22/2023   Thrombocytopenia (HCC) 02/22/2023   Adrenal insufficiency (HCC) 02/22/2023   Alcoholic liver disease (HCC) 02/22/2023   Upper GI bleed 02/22/2023   Myocardial injury 01/29/2023   Adrenal insufficiency (Addison's disease) (HCC) 01/28/2023   Reactive thrombocytosis 01/24/2023   Hypophosphatemia 01/22/2023   Acute respiratory failure with hypoxia (HCC) 01/20/2023   Orthostatic hypotension 01/17/2023   Anasarca 01/17/2023   Obesity (BMI 30-39.9) 01/17/2023   Cocaine dependence with cocaine-induced disorder (HCC) 01/17/2023   Rectal bleeding 01/17/2023   Peripheral edema 01/17/2023   History of benzodiazepine use 01/17/2023   Chest pain 11/19/2022   Acute on chronic heart failure with preserved ejection fraction (HFpEF)  (HCC) 10/29/2022   Acute respiratory distress 10/29/2022   Elevated rheumatoid factor 10/28/2022   Hyperkalemia 10/25/2022   Hemorrhoids, internal, with bleeding 10/25/2022   Mesenteric artery stenosis (HCC) 10/23/2022   Iron deficiency anemia 10/23/2022   Hematochezia    Abnormal CT scan, colon    Colitis 09/30/2022   Prolonged QT interval 09/30/2022   Alcohol use disorder 08/19/2022   GI bleed 05/28/2022   Non-intractable vomiting    Epigastric pain    Polyp of ascending colon    Anemia due to GI blood loss 08/07/2021   Acute gastritis    Hyperbilirubinemia    Elevated troponin    Notalgia    History of ST elevation myocardial infarction (STEMI) 12/19/2020   Alcohol dependence (HCC) 12/19/2020   Depression with anxiety 10/05/2020   Esophageal reflux 10/05/2020   Headache 10/05/2020   Hyperlipidemia 10/05/2020   Hyponatremia 06/04/2020   Hypomagnesemia 06/04/2020   Chronic systolic CHF (congestive heart failure) (HCC) 06/04/2020   Leukocytosis 06/04/2020   Abdominal pain 06/04/2020   Hypokalemia 06/04/2020   Ischemic cardiomyopathy    Polysubstance abuse (HCC)  NSTEMI (non-ST elevated myocardial infarction) (HCC) 07/12/2019   Abdominal pain, RUQ 05/25/2018   Chest pain, mid sternal 11/17/2012   Cocaine abuse (HCC)    Marijuana abuse    Tobacco abuse 02/26/2011   Essential hypertension 02/26/2011   CAD (coronary artery disease) 02/26/2011   Other specified forms of chronic ischemic heart disease 02/26/2011   PCP:  [name], NP Pharmacy:   [facility] [address] [place] - [address] [place] [address] Phone: [phone] Fax: [phone]  [facility] [address] [place] [address] Phone: [phone] Fax: [phone]  [id number] - [place], [place] - [address] [place] [address] Phone: [phone] Fax: [phone]     Social Determinants of  Health (SDOH) Social History: SDOH Screenings   Food Insecurity: No Food Insecurity (04/07/2023)  Housing: Low Risk  (04/07/2023)  Transportation Needs: No Transportation Needs (04/07/2023)  Utilities: Not At Risk (04/07/2023)  Alcohol Screen: Medium Risk (10/01/2022)  Tobacco Use: High Risk (04/09/2023)   SDOH Interventions:     Readmission Risk Interventions    02/24/2023    3:12 PM 01/17/2023    3:38 PM 10/22/2022   10:47 AM  Readmission Risk Prevention Plan  Transportation Screening Complete Complete Complete  PCP or Specialist Appt within 3-5 Days   Complete  Social Work Consult for Recovery Care Planning/Counseling   Complete  Palliative Care Screening   Not Applicable  Medication Review Oceanographer) Complete Complete Complete  SW Recovery Care/Counseling Consult  Not Complete   SW Consult Not Complete Comments  lethargic   Palliative Care Screening Not Applicable Not Applicable   Skilled Nursing Facility Not Applicable Not Applicable

## 2023-04-11 NOTE — Hospital Course (Addendum)
51 yom w/ history of recurrent GI bleed with known variceal bleed, ischemic cardiomyopathy, EtOH abuse, polysubstance abuse, HTN, HLD, depression, and anxiety presented to [facility] ED 4/22 for acute GI bleed. Of note patient was seen within this health sytem 1 month ago for significant GI bleed requiring banding per GI with TIPS procedure recommended but patient declined.Patient reports last drink and cocaine use was day prior to admission. In ED: He was hypotensive and mildly tachycardic. Lab work significant for K 2.3, glucoses 140, Mg 1.2, alkaline phos 153, albumin 2.3, AST 44, total protein 5.6, total bili 1.6, lactic 5.6, hgb 7.1, and hct 23.2. Given active bleed with compromised hemodynamics, PCCM consulted for further management and transferred to [facility] and admitted to ICU on 4/22   4/23 underwent EGD showing grade 2 varices, banded x 2, portal gastropathy> was intubated on 4/23 for flash pulmonary edema given Lasix and was proned, needed Precedex drip, not pressors. 4/25 patient extubated after diuresis 4/26: Transferred to [facility] service patient agreed for TIPS IR consulted by [facility] 04/11/23, echo done for pre-op

## 2023-04-11 NOTE — Anesthesia Preprocedure Evaluation (Addendum)
Anesthesia Evaluation  Patient identified by MRN, date of birth, ID band Patient awake    Reviewed: Allergy & Precautions, NPO status , Patient's Chart, lab work & pertinent test results  History of Anesthesia Complications Negative for: history of anesthetic complications  Airway Mallampati: III  TM Distance: >3 FB Neck ROM: full  Mouth opening: Limited Mouth Opening  Dental  (+) Poor Dentition, Edentulous Upper, Chipped, Missing   Pulmonary Current Smoker and Patient abstained from smoking.    + decreased breath sounds      Cardiovascular hypertension, + CAD, + Past MI, + Peripheral Vascular Disease and +CHF   Rhythm:Regular Rate:Normal - Systolic murmurs Echo 04/11/23 IMPRESSIONS     1. Left ventricular ejection fraction, by estimation, is 55 to 60%. The  left ventricle has normal function. The left ventricle has no regional  wall motion abnormalities. There is mild left ventricular hypertrophy.  Left ventricular diastolic parameters  are consistent with Grade II diastolic dysfunction (pseudonormalization).   2. Right ventricular systolic function is normal. The right ventricular  size is normal. There is normal pulmonary artery systolic pressure.   3. Anterior leaflet has increased thickening, likely calcification. Mild  mitral valve regurgitation.   4. The aortic valve is tricuspid. Aortic valve regurgitation is not  visualized.   5. The inferior vena cava is normal in size with greater than 50%  respiratory variability, suggesting right atrial pressure of 3 mmHg.       Echo 01/2023  1. Left ventricular ejection fraction, by estimation, is 50 to 55%. The left ventricle has low normal function. The left ventricle has no regional wall motion abnormalities. Left ventricular diastolic parameters are indeterminate.   2. Right ventricular systolic function is normal. The right ventricular size is normal.   3. The mitral  valve is normal in structure. Mild mitral valve regurgitation. No evidence of mitral stenosis.   4. The aortic valve is normal in structure. Aortic valve regurgitation is not visualized. No aortic stenosis is present.   5. The inferior vena cava is normal in size with greater than 50% respiratory variability, suggesting right atrial pressure of 3 mmHg.     Neuro/Psych  Headaches PSYCHIATRIC DISORDERS Anxiety Depression       GI/Hepatic ,GERD  Medicated,,(+) Cirrhosis     substance abuse  alcohol use, cocaine use and marijuana use  Endo/Other  negative endocrine ROS    Renal/GU      Musculoskeletal   Abdominal   Peds  Hematology  (+) Blood dyscrasia, anemia   Anesthesia Other Findings Past Medical History: No date: CAD (coronary artery disease)     Comment:  a. 01/2011 Anterior STEMI/Cath/PCI: LM nl, LAD 100d               (3.5x52mm Vision BMS placed), LCX 73m, RI 50, RCA min               irregs, EF 40% w/ apical, inferoapical HK. No date: Cardiac arrest - ventricular fibrillation     Comment:  a. In setting of STEMI 01/2011 No date: Chronic pain No date: Cocaine abuse (HCC) No date: Depression with anxiety No date: ETOH abuse     Comment:  a. 6-12 beers/day No date: GERD (gastroesophageal reflux disease) No date: Hemorrhoids No date: Hyperlipidemia No date: Hypertension No date: Ischemic cardiomyopathy     Comment:  a. 06/2011 Echo: EF 45-50%, No rwma No date: Marijuana abuse No date: Migraines No date: Tobacco abuse     Comment:  a. 1/2 ppd x 26 yrs  Past Surgical History: 08/09/2021: COLONOSCOPY     Comment:  Procedure: COLONOSCOPY;  Surgeon: Midge Minium, MD;                Location: [facility] ENDOSCOPY;  Service: Endoscopy;; No date: CORONARY ANGIOPLASTY WITH STENT PLACEMENT 05/30/2022: ESOPHAGOGASTRODUODENOSCOPY; N/A     Comment:  Procedure: ESOPHAGOGASTRODUODENOSCOPY (EGD);  Surgeon:               Toney Reil, MD;  Location: [facility] ENDOSCOPY;                 Service: Gastroenterology;  Laterality: N/A; 03/01/2021: ESOPHAGOGASTRODUODENOSCOPY (EGD) WITH PROPOFOL; N/A     Comment:  Procedure: ESOPHAGOGASTRODUODENOSCOPY (EGD) WITH               PROPOFOL;  Surgeon: Toney Reil, MD;  Location:               [facility];  Service: Endoscopy;  Laterality:               N/A; 08/09/2021: ESOPHAGOGASTRODUODENOSCOPY (EGD) WITH PROPOFOL; N/A     Comment:  Procedure: ESOPHAGOGASTRODUODENOSCOPY (EGD) WITH               PROPOFOL;  Surgeon: Midge Minium, MD;  Location: [facility]               ENDOSCOPY;  Service: Endoscopy;  Laterality: N/A; 10/02/2022: FLEXIBLE SIGMOIDOSCOPY; N/A     Comment:  Procedure: FLEXIBLE SIGMOIDOSCOPY;  Surgeon: Meryl Dare, MD;  Location: [facility] ENDOSCOPY;  Service:               Gastroenterology;  Laterality: N/A; 10/24/2022: VISCERAL ANGIOGRAPHY; N/A     Comment:  Procedure: VISCERAL ANGIOGRAPHY;  Surgeon: Annice Needy,              MD;  Location: [facility] INVASIVE CV LAB;  Service:               Cardiovascular;  Laterality: N/A;  BMI    Body Mass Index: 31.00 kg/m      Reproductive/Obstetrics negative OB ROS                             Anesthesia Physical Anesthesia Plan  ASA: 3  Anesthesia Plan: General   Post-op Pain Management: Minimal or no pain anticipated   Induction: Intravenous  PONV Risk Score and Plan: 1 and Ondansetron, Treatment may vary due to age or medical condition and Dexamethasone  Airway Management Planned: Oral ETT  Additional Equipment: Arterial line  Intra-op Plan:   Post-operative Plan: Possible Post-op intubation/ventilation  Informed Consent: I have reviewed the patients History and Physical, chart, labs and discussed the procedure including the risks, benefits and alternatives for the proposed anesthesia with the patient or authorized representative who has indicated his/her understanding and acceptance.     Dental advisory  given  Plan Discussed with: CRNA and Anesthesiologist  Anesthesia Plan Comments:         Anesthesia Quick Evaluation

## 2023-04-12 ENCOUNTER — Inpatient Hospital Stay (HOSPITAL_COMMUNITY): Payer: Medicare HMO

## 2023-04-12 ENCOUNTER — Encounter (HOSPITAL_COMMUNITY): Payer: Self-pay | Admitting: Interventional Radiology

## 2023-04-12 DIAGNOSIS — J810 Acute pulmonary edema: Secondary | ICD-10-CM | POA: Diagnosis not present

## 2023-04-12 DIAGNOSIS — K922 Gastrointestinal hemorrhage, unspecified: Secondary | ICD-10-CM | POA: Diagnosis not present

## 2023-04-12 DIAGNOSIS — Z9911 Dependence on respirator [ventilator] status: Secondary | ICD-10-CM

## 2023-04-12 DIAGNOSIS — E2740 Unspecified adrenocortical insufficiency: Secondary | ICD-10-CM | POA: Diagnosis not present

## 2023-04-12 DIAGNOSIS — K7030 Alcoholic cirrhosis of liver without ascites: Secondary | ICD-10-CM

## 2023-04-12 LAB — COMPREHENSIVE METABOLIC PANEL
ALT: 24 U/L (ref 0–44)
AST: 54 U/L — ABNORMAL HIGH (ref 15–41)
Albumin: 2.5 g/dL — ABNORMAL LOW (ref 3.5–5.0)
Alkaline Phosphatase: 110 U/L (ref 38–126)
Anion gap: 9 (ref 5–15)
BUN: 6 mg/dL (ref 6–20)
CO2: 24 mmol/L (ref 22–32)
Calcium: 7.6 mg/dL — ABNORMAL LOW (ref 8.9–10.3)
Chloride: 106 mmol/L (ref 98–111)
Creatinine, Ser: 0.66 mg/dL (ref 0.61–1.24)
GFR, Estimated: >60 mL/min (ref >60)
Glucose, Bld: 104 mg/dL — ABNORMAL HIGH (ref 70–99)
Potassium: 2.7 mmol/L — CL (ref 3.5–5.1)
Sodium: 139 mmol/L (ref 135–145)
Total Bilirubin: 3.3 mg/dL — ABNORMAL HIGH (ref 0.3–1.2)
Total Protein: 5.3 g/dL — ABNORMAL LOW (ref 6.5–8.1)

## 2023-04-12 LAB — POCT I-STAT 7, (LYTES, BLD GAS, ICA,H+H)
Acid-Base Excess: 1 mmol/L (ref 0.0–2.0)
Bicarbonate: 25.6 mmol/L (ref 20.0–28.0)
Calcium, Ion: 1.12 mmol/L — ABNORMAL LOW (ref 1.15–1.40)
HCT: 28 % — ABNORMAL LOW (ref 39.0–52.0)
Hemoglobin: 9.5 g/dL — ABNORMAL LOW (ref 13.0–17.0)
O2 Saturation: 96 %
Patient temperature: 97.8
Potassium: 2.8 mmol/L — ABNORMAL LOW (ref 3.5–5.1)
Sodium: 143 mmol/L (ref 135–145)
TCO2: 27 mmol/L (ref 22–32)
pCO2 arterial: 37.6 mmHg (ref 32–48)
pH, Arterial: 7.439 (ref 7.35–7.45)
pO2, Arterial: 80 mmHg — ABNORMAL LOW (ref 83–108)

## 2023-04-12 LAB — TRIGLYCERIDES: Triglycerides: 178 mg/dL — ABNORMAL HIGH (ref <150)

## 2023-04-12 LAB — CBC
HCT: 29.6 % — ABNORMAL LOW (ref 39.0–52.0)
Hemoglobin: 9.5 g/dL — ABNORMAL LOW (ref 13.0–17.0)
MCH: 29.1 pg (ref 26.0–34.0)
MCHC: 32.1 g/dL (ref 30.0–36.0)
MCV: 90.8 fL (ref 80.0–100.0)
Platelets: 100 10*3/uL — ABNORMAL LOW (ref 150–400)
RBC: 3.26 MIL/uL — ABNORMAL LOW (ref 4.22–5.81)
RDW: 17.6 % — ABNORMAL HIGH (ref 11.5–15.5)
WBC: 10.4 10*3/uL (ref 4.0–10.5)
nRBC: 0 % (ref 0.0–0.2)

## 2023-04-12 LAB — BASIC METABOLIC PANEL
Anion gap: 8 (ref 5–15)
BUN: 5 mg/dL — ABNORMAL LOW (ref 6–20)
CO2: 27 mmol/L (ref 22–32)
Calcium: 7.8 mg/dL — ABNORMAL LOW (ref 8.9–10.3)
Chloride: 108 mmol/L (ref 98–111)
Creatinine, Ser: 0.7 mg/dL (ref 0.61–1.24)
GFR, Estimated: >60 mL/min (ref >60)
Glucose, Bld: 156 mg/dL — ABNORMAL HIGH (ref 70–99)
Potassium: 3.8 mmol/L (ref 3.5–5.1)
Sodium: 143 mmol/L (ref 135–145)

## 2023-04-12 LAB — MAGNESIUM: Magnesium: 1.7 mg/dL (ref 1.7–2.4)

## 2023-04-12 LAB — CULTURE, BLOOD (ROUTINE X 2)
Culture: NO GROWTH
Culture: NO GROWTH
Special Requests: ADEQUATE
Special Requests: ADEQUATE

## 2023-04-12 LAB — GLUCOSE, CAPILLARY
Glucose-Capillary: 97 mg/dL (ref 70–99)
Glucose-Capillary: 88 mg/dL (ref 70–99)
Glucose-Capillary: 111 mg/dL — ABNORMAL HIGH (ref 70–99)
Glucose-Capillary: 134 mg/dL — ABNORMAL HIGH (ref 70–99)
Glucose-Capillary: 173 mg/dL — ABNORMAL HIGH (ref 70–99)
Glucose-Capillary: 112 mg/dL — ABNORMAL HIGH (ref 70–99)

## 2023-04-12 LAB — PHOSPHORUS: Phosphorus: 2.2 mg/dL — ABNORMAL LOW (ref 2.5–4.6)

## 2023-04-12 LAB — AMMONIA: Ammonia: 20 umol/L (ref 9–35)

## 2023-04-12 MED ORDER — MAGNESIUM SULFATE 2 GM/50ML IV SOLN
2.0000 g | Freq: Once | INTRAVENOUS | Status: AC
  Administered 2023-04-12: 2 g via INTRAVENOUS
  Filled 2023-04-12: qty 50

## 2023-04-12 MED ORDER — SODIUM PHOSPHATES 45 MMOLE/15ML IV SOLN
15.0000 mmol | Freq: Once | INTRAVENOUS | Status: AC
  Administered 2023-04-12: 15 mmol via INTRAVENOUS
  Filled 2023-04-12: qty 5

## 2023-04-12 MED ORDER — MAGNESIUM SULFATE 2 GM/50ML IV SOLN: 2.0000 g | Freq: Once | INTRAVENOUS | Status: DC

## 2023-04-12 MED ORDER — FENTANYL BOLUS VIA INFUSION
50.0000 ug | INTRAVENOUS | Status: DC | PRN
  Administered 2023-04-12 – 2023-04-14 (×4): 50 ug via INTRAVENOUS

## 2023-04-12 MED ORDER — ORAL CARE MOUTH RINSE
15.0000 mL | OROMUCOSAL | Status: DC | PRN
  Administered 2023-04-16: 15 mL via OROMUCOSAL

## 2023-04-12 MED ORDER — FENTANYL CITRATE PF 50 MCG/ML IJ SOSY
50.0000 ug | PREFILLED_SYRINGE | INTRAMUSCULAR | Status: DC | PRN
  Administered 2023-04-12 (×2): 100 ug via INTRAVENOUS
  Filled 2023-04-12 (×2): qty 2

## 2023-04-12 MED ORDER — POTASSIUM CHLORIDE 10 MEQ/50ML IV SOLN
10.0000 meq | INTRAVENOUS | Status: AC
  Administered 2023-04-12 (×6): 10 meq via INTRAVENOUS
  Filled 2023-04-12 (×3): qty 50

## 2023-04-12 MED ORDER — POTASSIUM CHLORIDE 10 MEQ/50ML IV SOLN: 10.0000 meq | INTRAVENOUS | Status: DC

## 2023-04-12 MED ORDER — POTASSIUM CHLORIDE CRYS ER 20 MEQ PO TBCR
40.0000 meq | EXTENDED_RELEASE_TABLET | ORAL | Status: AC
  Administered 2023-04-12 (×2): 40 meq via ORAL
  Filled 2023-04-12 (×2): qty 2

## 2023-04-12 MED ORDER — FENTANYL CITRATE PF 50 MCG/ML IJ SOSY
50.0000 ug | PREFILLED_SYRINGE | Freq: Once | INTRAMUSCULAR | Status: AC
  Administered 2023-04-12: 50 ug via INTRAVENOUS
  Filled 2023-04-12: qty 1

## 2023-04-12 MED ORDER — FENTANYL 2500MCG IN NS 250ML (10MCG/ML) PREMIX INFUSION
50.0000 ug/h | INTRAVENOUS | Status: DC
  Administered 2023-04-12 – 2023-04-13 (×2): 100 ug/h via INTRAVENOUS
  Administered 2023-04-13: 150 ug/h via INTRAVENOUS
  Filled 2023-04-12 (×3): qty 250

## 2023-04-12 MED ORDER — ORAL CARE MOUTH RINSE
15.0000 mL | OROMUCOSAL | Status: DC
  Administered 2023-04-12: 15 mL via OROMUCOSAL

## 2023-04-12 MED ORDER — FUROSEMIDE 10 MG/ML IJ SOLN
60.0000 mg | Freq: Once | INTRAMUSCULAR | Status: AC
  Administered 2023-04-12: 60 mg via INTRAVENOUS
  Filled 2023-04-12: qty 6

## 2023-04-12 MED ORDER — IPRATROPIUM-ALBUTEROL 0.5-2.5 (3) MG/3ML IN SOLN
3.0000 mL | RESPIRATORY_TRACT | Status: DC | PRN
  Administered 2023-04-12 – 2023-04-13 (×3): 3 mL via RESPIRATORY_TRACT
  Filled 2023-04-12 (×2): qty 3

## 2023-04-12 MED ORDER — ORAL CARE MOUTH RINSE
15.0000 mL | OROMUCOSAL | Status: DC
  Administered 2023-04-12 – 2023-04-15 (×32): 15 mL via OROMUCOSAL

## 2023-04-12 MED ORDER — ORAL CARE MOUTH RINSE
15.0000 mL | OROMUCOSAL | Status: DC
  Administered 2023-04-12 (×3): 15 mL via OROMUCOSAL

## 2023-04-12 MED ORDER — SODIUM CHLORIDE 0.9 % IV SOLN: INTRAVENOUS | Status: DC | PRN

## 2023-04-12 MED ORDER — ORAL CARE MOUTH RINSE: 15.0000 mL | OROMUCOSAL | Status: DC | PRN

## 2023-04-12 MED ORDER — FAMOTIDINE 20 MG PO TABS
20.0000 mg | ORAL_TABLET | Freq: Two times a day (BID) | ORAL | Status: DC
  Administered 2023-04-12: 20 mg
  Filled 2023-04-12: qty 1

## 2023-04-12 MED ORDER — CALCIUM GLUCONATE-NACL 1-0.675 GM/50ML-% IV SOLN
1.0000 g | Freq: Once | INTRAVENOUS | Status: AC
  Administered 2023-04-12: 1000 mg via INTRAVENOUS
  Filled 2023-04-12: qty 50

## 2023-04-12 MED ORDER — LACTULOSE 10 GM/15ML PO SOLN
30.0000 g | Freq: Three times a day (TID) | ORAL | Status: DC
  Administered 2023-04-12 – 2023-04-13 (×5): 30 g via ORAL
  Filled 2023-04-12 (×6): qty 45

## 2023-04-12 MED ORDER — FENTANYL CITRATE PF 50 MCG/ML IJ SOSY: 50.0000 ug | PREFILLED_SYRINGE | INTRAMUSCULAR | Status: DC | PRN

## 2023-04-12 MED ORDER — NOREPINEPHRINE 4 MG/250ML-% IV SOLN
0.0000 ug/min | INTRAVENOUS | Status: DC
  Administered 2023-04-12: 2 ug/min via INTRAVENOUS
  Administered 2023-04-13: 3 ug/min via INTRAVENOUS
  Administered 2023-04-13: 5 ug/min via INTRAVENOUS
  Administered 2023-04-14: 3 ug/min via INTRAVENOUS
  Filled 2023-04-12 (×4): qty 250

## 2023-04-12 NOTE — Progress Notes (Signed)
Referring Physician(s): Mumma,Shannon  Supervising Physician: Malachy Moan  Patient Status:  [facility] - In-pt  Chief Complaint:  Recurrent GI Bleed.  S/p TIPS by Dr. Archer Asa and dr. Milford Cage yesterday    Subjective:  Patient still intubated, RN at bedside. RN states that patient has been doing fine but has lot of fluid retention, on diuretic. No issues overnight.   Allergies: Penicillins and Other  Medications: Prior to Admission medications   Medication Sig Start Date End Date Taking? Authorizing Provider  acetaminophen (TYLENOL) 500 MG tablet Take 1 tablet (500 mg total) by mouth every 8 (eight) hours as needed for mild pain, fever or headache. 03/05/23   [name], MD  gabapentin (NEURONTIN) 300 MG capsule Take 1 capsule (300 mg total) by mouth 3 (three) times daily. 03/05/23 06/03/23  [name], MD  hydrocortisone (CORTEF) 10 MG tablet Take 1 tablet (10 mg total) by mouth daily. 03/05/23 03/04/24  [name], MD  hydrocortisone (CORTEF) 5 MG tablet Take 1 tablet (5 mg total) by mouth daily after lunch. 03/05/23 03/04/24  [name], MD  ibuprofen (ADVIL) 800 MG tablet Take 800 mg by mouth every 8 (eight) hours as needed. 12/21/22   [provider]  lactulose (CHRONULAC) 10 GM/15ML solution Take 30 mLs (20 g total) by mouth 2 (two) times daily. Titrate to 1-2 BM per day 03/05/23   [name], MD  methocarbamol (ROBAXIN) 750 MG tablet Take 1 tablet (750 mg total) by mouth daily as needed for muscle spasms. 03/05/23   [name], MD  midodrine (PROAMATINE) 10 MG tablet Take 1 tablet (10 mg total) by mouth 3 (three) times daily with meals. Skip the dose if Systolic BP greater than 110 mmHg and or HR greater than 100 03/05/23 06/03/23  [name], MD  Multiple Vitamin (MULTIVITAMIN WITH MINERALS) TABS tablet Take 1 tablet by mouth daily. 03/06/23 03/05/24  [name], MD  nitroGLYCERIN (NITROSTAT) 0.4 MG SL tablet Place 1 tablet (0.4 mg total) under the  tongue every 5 (five) minutes as needed for chest pain. 03/05/23   [name], MD  pantoprazole (PROTONIX) 40 MG tablet Take 1 tablet (40 mg total) by mouth 2 (two) times daily. 03/05/23 03/04/24  [name], MD  QUEtiapine (SEROQUEL) 25 MG tablet Take 1 tablet (25 mg total) by mouth at bedtime. 03/05/23 06/03/23  [name], MD  rosuvastatin (CRESTOR) 40 MG tablet Take 1 tablet (40 mg total) by mouth daily. 03/05/23 03/04/24  [name], MD  spironolactone (ALDACTONE) 25 MG tablet Take 0.5 tablets (12.5 mg total) by mouth daily. 03/06/23 06/04/23  [name], MD  torsemide 40 MG TABS Take 40 mg by mouth 2 (two) times daily for 14 days, THEN 40 mg daily. Patient not taking: Reported on 04/07/2023 03/05/23 03/18/24  [name], MD     Vital Signs: BP 103/63   Pulse 95   Temp 98 F (36.7 C) (Axillary)   Resp (!) 23   Ht 5\' 5"  (1.651 m)   Wt 210 lb 5.1 oz (95.4 kg)   SpO2 96%   BMI 35.00 kg/m   Physical Exam Vitals reviewed.  Constitutional:      General: He is not in acute distress.    Comments: On vent  Neck:     Comments: Positive dressing on r neck puncture site. Site is unremarkable with no erythema, edema, tenderness, bleeding or drainage. Dressing clean, dry, and intact.   Cardiovascular:     Rate and Rhythm: Normal rate and  regular rhythm.  Abdominal:     General: Abdomen is flat. Bowel sounds are normal. There is no distension.     Palpations: Abdomen is soft.     Comments: Two RUQ puncture sites both clean, and dry, no bleeding  Musculoskeletal:     Comments: Positive dressing on R CFV puncture site. Site is unremarkable with no erythema, edema, tenderness, bleeding or drainage.  Dressing clean, dry, and intact.    Skin:    General: Skin is warm and dry.     Coloration: Skin is not jaundiced.  Neurological:     Comments: Opens eyes when called by first name      Imaging: DG Abd Portable 1V  Result Date: 04/12/2023 CLINICAL DATA:  Tube placement EXAM:  PORTABLE ABDOMEN - 1 VIEW COMPARISON:  02/28/2023 FINDINGS: Image of the upper abdomen demonstrates N/OG tube tip superimposed with the stomach below the diaphragm. IMPRESSION: N/OG tube in place. Electronically Signed   By: Layla Maw M.D.   On: 04/12/2023 11:06   Portable Chest x-ray  Result Date: 04/12/2023 CLINICAL DATA:  Check endotracheal tube placement EXAM: PORTABLE CHEST 1 VIEW COMPARISON:  04/11/2023 FINDINGS: Left jugular central line is again noted in the mid superior vena cava. Endotracheal tube is seen 3.2 cm above the carina. Cardiac shadow is enlarged but stable. Diffuse increased airspace opacity is noted bilaterally most consistent with worsening edema. No pneumothorax is seen. No bony abnormality is noted. IMPRESSION: Endotracheal tube in satisfactory position. Worsening pulmonary edema. Electronically Signed   By: Alcide Clever M.D.   On: 04/12/2023 03:35   ECHOCARDIOGRAM COMPLETE  Result Date: 04/11/2023    ECHOCARDIOGRAM REPORT   Patient Name:   Ethan Wells Date of Exam: 04/11/2023 Medical Rec #:  [id number]           Height:       65.0 in Accession #:    [id number]          Weight:       197.3 lb Date of Birth:  1971/05/22           BSA:          1.967 m Patient Age:    52 years            BP:           115/68 mmHg Patient Gender: M                   HR:           89 bpm. Exam Location:  Inpatient Procedure: 2D Echo, Cardiac Doppler, Color Doppler and Intracardiac            Opacification Agent Indications:    Other abnormalities of the heart R00.8  History:        Patient has prior history of Echocardiogram examinations. CAD;                 Risk Factors:Hypertension and Dyslipidemia.  Sonographer:    Mike Gip Referring Phys: [phone] JENNIFER C OMOHUNDRO IMPRESSIONS  1. Left ventricular ejection fraction, by estimation, is 55 to 60%. The left ventricle has normal function. The left ventricle has no regional wall motion abnormalities. There is mild left ventricular  hypertrophy. Left ventricular diastolic parameters are consistent with Grade II diastolic dysfunction (pseudonormalization).  2. Right ventricular systolic function is normal. The right ventricular size is normal. There is normal pulmonary artery systolic pressure.  3. Anterior leaflet has increased thickening, likely  calcification. Mild mitral valve regurgitation.  4. The aortic valve is tricuspid. Aortic valve regurgitation is not visualized.  5. The inferior vena cava is normal in size with greater than 50% respiratory variability, suggesting right atrial pressure of 3 mmHg. FINDINGS  Left Ventricle: Left ventricular ejection fraction, by estimation, is 55 to 60%. The left ventricle has normal function. The left ventricle has no regional wall motion abnormalities. Definity contrast agent was given IV to delineate the left ventricular  endocardial borders. The left ventricular internal cavity size was normal in size. There is mild left ventricular hypertrophy. Left ventricular diastolic parameters are consistent with Grade II diastolic dysfunction (pseudonormalization). Right Ventricle: The right ventricular size is normal. Right ventricular systolic function is normal. There is normal pulmonary artery systolic pressure. The tricuspid regurgitant velocity is 2.54 m/s, and with an assumed right atrial pressure of 3 mmHg,  the estimated right ventricular systolic pressure is 28.8 mmHg. Left Atrium: Left atrial size was normal in size. Right Atrium: Right atrial size was normal in size. Pericardium: There is no evidence of pericardial effusion. Mitral Valve: Anterior leaflet has increased thickening, likely calcification. Mild mitral valve regurgitation. Tricuspid Valve: Tricuspid valve regurgitation is mild. Aortic Valve: The aortic valve is tricuspid. Aortic valve regurgitation is not visualized. Pulmonic Valve: Pulmonic valve regurgitation is not visualized. Aorta: The aortic root and ascending aorta are  structurally normal, with no evidence of dilitation. Venous: The inferior vena cava is normal in size with greater than 50% respiratory variability, suggesting right atrial pressure of 3 mmHg. IAS/Shunts: No atrial level shunt detected by color flow Doppler.  LEFT VENTRICLE PLAX 2D LVIDd:         5.80 cm      Diastology LVIDs:         4.60 cm      LV e' medial:    4.35 cm/s LV PW:         1.10 cm      LV E/e' medial:  22.0 LV IVS:        1.00 cm      LV e' lateral:   8.59 cm/s LVOT diam:     2.20 cm      LV E/e' lateral: 11.1 LV SV:         87 LV SV Index:   44 LVOT Area:     3.80 cm  LV Volumes (MOD) LV vol d, MOD A2C: 156.0 ml LV vol d, MOD A4C: 149.0 ml LV vol s, MOD A2C: 69.6 ml LV vol s, MOD A4C: 68.0 ml LV SV MOD A2C:     86.4 ml LV SV MOD A4C:     149.0 ml LV SV MOD BP:      87.6 ml RIGHT VENTRICLE             IVC RV Basal diam:  4.20 cm     IVC diam: 1.80 cm RV S prime:     12.30 cm/s TAPSE (M-mode): 2.0 cm LEFT ATRIUM             Index        RIGHT ATRIUM           Index LA diam:        3.90 cm 1.98 cm/m   RA Area:     20.40 cm LA Vol (A2C):   66.0 ml 33.56 ml/m  RA Volume:   55.90 ml  28.42 ml/m LA Vol (A4C):   59.3 ml 30.15 ml/m LA  Biplane Vol: 62.5 ml 31.78 ml/m  AORTIC VALVE LVOT Vmax:   102.00 cm/s LVOT Vmean:  68.700 cm/s LVOT VTI:    0.229 m  AORTA Ao Root diam: 3.30 cm Ao Asc diam:  3.10 cm MITRAL VALVE               TRICUSPID VALVE MV Area (PHT): 4.60 cm    TR Peak grad:   25.8 mmHg MV Decel Time: 165 msec    TR Vmax:        254.00 cm/s MV E velocity: 95.50 cm/s MV A velocity: 32.60 cm/s  SHUNTS MV E/A ratio:  2.93        Systemic VTI:  0.23 m                            Systemic Diam: 2.20 cm Carolan Clines Electronically signed by Carolan Clines Signature Date/Time: 04/11/2023/10:06:14 AM    Final    DG Chest Port 1 View  Result Date: 04/11/2023 CLINICAL DATA:  5626 Acute respiratory failure 5626 EXAM: PORTABLE CHEST 1 VIEW COMPARISON:  Chest XR, 04/09/2023.  CT AP, 01/17/2023. FINDINGS:  Support lines: Interval extubation. LEFT IJ CVC with catheter tip well-positioned at the superior cavoatrial junction. Cardiomegaly. Perihilar and central bronchial wall thickening. Minimal bibasilar interstitial opacities. No pleural effusion or pneumothorax. No interval osseous abnormality. IMPRESSION: 1. Extubation.  Stable, well-positioned LEFT IJ-approach CVC. 2. Cardiomegaly with perihilar thickening and minimal basilar opacities. Findings favored to represent mild pulmonary edema and atelectasis. Electronically Signed   By: Roanna Banning M.D.   On: 04/11/2023 07:30   DG Chest Port 1 View  Result Date: 04/09/2023 CLINICAL DATA:  Hypoxia EXAM: PORTABLE CHEST 1 VIEW COMPARISON:  04/09/2023 FINDINGS: Endotracheal tube is again noted and stable. Left jugular central line are again seen in satisfactory position. Significant improvement in the degree of congestive failure is noted. Some residual basilar opacities are seen. No bony abnormality is noted. IMPRESSION: Near complete resolution of previously seen pulmonary edema. Electronically Signed   By: Alcide Clever M.D.   On: 04/09/2023 12:12   DG CHEST PORT 1 VIEW  Result Date: 04/09/2023 CLINICAL DATA:  Respiratory failure, prone repositioning EXAM: PORTABLE CHEST 1 VIEW COMPARISON:  04/08/2023 FINDINGS: Endotracheal tube is seen 14 mm above the carina. Left internal jugular central venous catheter tip noted overlying the expected superior vena cava. Pulmonary insufflation is stable. Superimposed extensive bilateral perihilar and lower lung zone consolidation in keeping with severe alveolar pulmonary edema or diffuse pneumonic infiltrate has progressed in the interval since prior examination. No pneumothorax or pleural effusion. Cardiac size within normal limits. IMPRESSION: 1. Endotracheal tube 14 mm above the carina. 2. Progressive bilateral perihilar and lower lung zone consolidation, severe alveolar pulmonary edema or diffuse pneumonic infiltrate.  Electronically Signed   By: Helyn Numbers M.D.   On: 04/09/2023 03:01   DG CHEST PORT 1 VIEW  Result Date: 04/08/2023 CLINICAL DATA:  Hypoxia EXAM: PORTABLE CHEST 1 VIEW COMPARISON:  04/07/2023 FINDINGS: The endotracheal tube has been placed with tip measuring 3.8 cm above the carina. Left central venous catheter with tip over the low SVC region. No pneumothorax. Cardiac enlargement with bilateral perihilar infiltrates, progressing since prior study, likely edema. No pleural effusions. No pneumothorax. Mediastinal contours appear intact. IMPRESSION: 1. Appliances appear in satisfactory position. 2. Cardiac enlargement with perihilar infiltration increasing since prior study, likely edema. Electronically Signed   By: Marisa Cyphers.D.  On: 04/08/2023 22:04    Labs:  CBC: Recent Labs    04/10/23 0856 04/10/23 2306 04/11/23 0857 04/12/23 0414 04/12/23 0551  WBC 8.7 7.6 6.6  --  10.4  HGB 9.8* 9.0* 9.2* 9.5* 9.5*  HCT 30.9* 27.2* 27.8* 28.0* 29.6*  PLT 73* 65* 61*  --  100*    COAGS: Recent Labs    01/17/23 0043 01/18/23 0204 02/22/23 0450 02/28/23 0445 03/03/23 0340 03/04/23 0640 04/07/23 1026 04/11/23 1002  INR 1.3*   < > 1.6*   < > 1.4* 1.3* 1.6* 1.5*  APTT 37*  --  33  --   --   --  34  --    < > = values in this interval not displayed.    BMP: Recent Labs    04/11/23 0359 04/11/23 1002 04/11/23 1527 04/12/23 0414 04/12/23 0551  NA 141 139 139 143 139  K 2.8* 3.3* 3.3* 2.8* 2.7*  CL 106 106 106  --  106  CO2 25 25 26   --  24  GLUCOSE 109* 133* 160*  --  104*  BUN 14 12 10   --  6  CALCIUM 7.8* 7.5* 7.5*  --  7.6*  CREATININE 0.71 0.74 0.73  --  0.66  GFRNONAA >60 >60 >60  --  >60    LIVER FUNCTION TESTS: Recent Labs    04/09/23 0533 04/10/23 0528 04/11/23 1002 04/12/23 0551  BILITOT 2.5* 2.2* 2.5* 3.3*  AST 37 38 35 54*  ALT 17 17 17 24   ALKPHOS 131* 124 109 110  PROT 5.6* 5.6* 5.5* 5.3*  ALBUMIN 2.3* 2.3* 2.3* 2.5*    Assessment and  Plan:  52 y.o. male with History of CHF, Barrett's esophagus, alcoholic cirrhosis s/p banding of his esophageal varices in March 2024 presented to [facility] with recurrent GIB, s/p TIPS with Dr. Milford Cage and Dr. Archer Asa yesterday.   Patient still intubated and sedated, opens eyes with verbal que  CBC with stable hgb CMP with mild elevation in AST from 35 to 54, and T bili from 2.5 to 3.3 stable otherwise  Ammonia 20 today, improved from 4/24 75  On Lactulose 30 g BID  Puncture sites all clean and dry, no active bleeding   Further treatment plan per PCCM/ GI Appreciate and agree with the plan.  IR will follow, please call IR for questions and concerns.    Electronically Signed: Willette Brace, PA-C 04/12/2023, 11:22 AM   I spent a total of 15 Minutes at the the patient's bedside AND on the patient's hospital floor or unit, greater than 50% of which was counseling/coordinating care for s/p TIPS.   This chart was dictated using voice recognition software.  Despite best efforts to proofread,  errors can occur which can change the documentation meaning.

## 2023-04-12 NOTE — Progress Notes (Signed)
Pharmacy Electrolyte Replacement  Recent Labs:  Recent Labs    04/12/23 0551  K 2.7*  MG 1.7  PHOS 2.2*  CREATININE 0.66    Low Critical Values (K </= 2.5, Phos </= 1, Mg </= 1) Present: None  Plan: Will give 1g of calcium gluconate, and of NaPhos. Patient already received K and Mag supplementation.   Estill Batten, PharmD, BCCCP

## 2023-04-12 NOTE — Progress Notes (Incomplete)
eLink Physician-Brief Progress Note Patient Name: Ethan Wells DOB: 1971/05/22 MRN: [id number]   Date of Service  04/12/2023  HPI/Events of Note  52 yr old male with alcoholic cirrhosis transferred to   eICU Interventions       Intervention Category Intermediate Interventions: Other:  [name], P 04/12/2023, 12:02 AM

## 2023-04-12 NOTE — Progress Notes (Signed)
NAME:  Ethan Wells, MRN:  [id number], DOB:  September 16, 1971, LOS: 5 ADMISSION DATE:  04/07/2023, CONSULTATION DATE:  04/07/2023 REFERRING MD:  Dr. Arnoldo Morale, CHIEF COMPLAINT:  Upper GI bleed    History of Present Illness:  Ethan Wells is a 52 yo male with a PMH significant for recurrent GI bleed with known variceal bleed, ischemic cardiomyopathy, EtOH abuse, polysubstance abuse, HTN, HLD, depression, and anxiety who presented to [facility] ED 4/22 for acute GI bleed. Of note patient was seen within this health sytem 1 month ago for significant GI bleed requiring banding per GI with TIPS procedure recommended but patient declined. Patient reports last drink and cocaine use was day prior to admission.  On ED arrival patient was seen hypotensive and mildly tachycardic. Lab work significant for K 2.3, glucoses 140, Mg 1.2, alkaline phos 153, albumin 2.3, AST 44, total protein 5.6, total bili 1.6, lactic 5.6, hgb 7.1, and hct 23.2. Given active bleed with compromised hemodynamics, PCCM consulted for further management and admission.   Pertinent  Medical History  Recurrent GI bleed with known variceal bleed, ischemic cardiomyopathy, EtOH abuse, polysubstance abuse, HTN, HLD, depression, and anxiety   Significant Hospital Events: Including procedures, antibiotic start and stop dates in addition to other pertinent events   4/22 admitted with acute GI bleed  4/23 EGD >> X esophagus, grade 2 varices, banded x 2, portal gastropathy 4/23 intubated for flash pulm edema , started on Lasix and proned 4/25 extubated 4/27 TIPS, re intubated for procedure. Did not meet extubation criteria and was returned to ICU on vent.   Interim History / Subjective:  TIPS, re intubated for procedure. Did not meet extubation criteria and was returned to ICU on vent.   Objective   Blood pressure 132/82, pulse 78, temperature 97.8 F (36.6 C), temperature source Oral, resp. rate 16, height 5\' 5"  (1.651 m), weight 89.5 kg,  SpO2 100 %.    Vent Mode: PRVC FiO2 (%):  [40 %-60 %] 40 % Set Rate:  [16 bmp] 16 bmp Vt Set:  [490 mL] 490 mL PEEP:  [5 cmH20] 5 cmH20   Intake/Output Summary (Last 24 hours) at 04/12/2023 0318 Last data filed at 04/11/2023 2240 Gross per 24 hour  Intake 3306.09 ml  Output 3115 ml  Net 191.09 ml    Filed Weights   04/09/23 0700 04/10/23 0500 04/11/23 0500  Weight: 91.6 kg 89.7 kg 89.5 kg    Examination: General:  Middle aged male on vent Neuro:  sedated RASS -4 HEENT:  [place]/AT, No JVD noted, PERRL Cardiovascular:  RRR, no MRG Lungs:  Expiratory wheeze Abdomen:  Soft, non-distended Musculoskeletal:  No acute deformity Skin:  Intact, MMM   Assessment & Plan:    Acute respiratory failure with hypoxia: extubated 4/25, but reintubated 4/27 for TIPS. Returned from IR intubated.  TACO- resolved - Full vent support - ABG - CXR - PRN duoneb for wheeze.  - Slowly weaning sedation for hopeful extubation in AM  Hemorrhagic shock Variceal bleed- post banding P: - PPI/octreotide - GI recommending non-selective BB after 5 days octreotide.  - Repeat EGD 3 weeks. - Trend hemoglobin,  transfuse for less than 7  Hx cirrhosis 2/2 EtOH Portal gastropathy S/p TIPS 4/27 P: - Empiric CTX has been stopped - GI following - Monitor for hepatic encephalopathy s/p TIPS  Hx Systolic congestive heart failure (EF 50-55% with WMA 02/04/23), HoTN CAD, HTN, HLD P: - Continuous Telemetry  - Continue home Midodrine - Hold home Rosuvastatin,  Spironolactone, -Torsemide - Lasix 40 daily continue  Hx adrenal insufficiency  - Stress steroids in lieu of home Cortef (15mg  daily) > wean as tolerated  Acute Metabolic encephalopathy Hx polysubstance abuse including EtOH - Thiamine/folate,  - Monitor for s/s of w/d   Best Practice (right click and "Reselect all SmartList Selections" daily)   Diet/type: NPO DVT prophylaxis: SCD GI prophylaxis: PPI Lines: Central line Foley:  N/A Code  Status:  full code Last date of multidisciplinary goals of care discussion: 04/08/23  Critical care time 30 minutes  Joneen Roach, AGACNP-BC [facility]  See Amion for personal pager PCCM on call pager [phone] until 7pm. Please call Elink 7p-7a. [phone]  04/12/2023 3:45 AM

## 2023-04-12 NOTE — Progress Notes (Signed)
NAME:  Ethan Wells, MRN:  [id number], DOB:  [birth year], LOS: 5 ADMISSION DATE:  04/07/2023, CONSULTATION DATE:  04/07/2023 REFERRING MD:  Dr. Arnoldo Morale, CHIEF COMPLAINT:  Upper GI bleed    History of Present Illness:  Ethan Wells is a 52 yo male with a PMH significant for recurrent GI bleed with known variceal bleed, ischemic cardiomyopathy, EtOH abuse, polysubstance abuse, HTN, HLD, depression, and anxiety who presented to [facility] ED 4/22 for acute GI bleed. Of note patient was seen within this health sytem 1 month ago for significant GI bleed requiring banding per GI with TIPS procedure recommended but patient declined. Patient reports last drink and cocaine use was day prior to admission.  On ED arrival patient was seen hypotensive and mildly tachycardic. Lab work significant for K 2.3, glucoses 140, Mg 1.2, alkaline phos 153, albumin 2.3, AST 44, total protein 5.6, total bili 1.6, lactic 5.6, hgb 7.1, and hct 23.2. Given active bleed with compromised hemodynamics, PCCM consulted for further management and admission.   Pertinent  Medical History  Recurrent GI bleed with known variceal bleed, ischemic cardiomyopathy, EtOH abuse, polysubstance abuse, HTN, HLD, depression, and anxiety   Significant Hospital Events: Including procedures, antibiotic start and stop dates in addition to other pertinent events   4/22 admitted with acute GI bleed  4/23 EGD >> X esophagus, grade 2 varices, banded x 2, portal gastropathy 4/23 intubated for flash pulm edema , started on Lasix and proned 4/25 extubated 4/27 TIPS, re intubated for procedure. Did not meet extubation criteria and was returned to ICU on vent.   Interim History / Subjective:  Wean sedation.  Nonpurposeful somewhat agitated with weaning.  Altering sedation plan.  Objective   Blood pressure 103/63, pulse 96, temperature 98.8 F (37.1 C), temperature source Axillary, resp. rate (!) 22, height 5\' 5"  (1.651 m), weight 95.4 kg, SpO2 95  %.    Vent Mode: PSV;CPAP FiO2 (%):  [40 %-60 %] 40 % Set Rate:  [16 bmp-18 bmp] 18 bmp Vt Set:  [490 mL] 490 mL PEEP:  [5 cmH20] 5 cmH20 Pressure Support:  [5 cmH20] 5 cmH20 Plateau Pressure:  [23 cmH20] 23 cmH20   Intake/Output Summary (Last 24 hours) at 04/12/2023 1202 Last data filed at 04/12/2023 1200 Gross per 24 hour  Intake 3310.97 ml  Output 5775 ml  Net -2464.03 ml    Filed Weights   04/10/23 0500 04/11/23 0500 04/12/23 0500  Weight: 89.7 kg 89.5 kg 95.4 kg    Examination: General:  Middle aged male on vent Neuro:  sedated RASS -3 HEENT:  [place]/AT, No JVD noted, PERRL Cardiovascular:  RRR, no MRG Lungs:  Expiratory wheeze Abdomen:  Soft, non-distended Musculoskeletal:  No acute deformity Skin:  Intact, MMM   Assessment & Plan:    Acute respiratory failure with hypoxia: extubated 4/25, but reintubated 4/27 for TIPS. Returned from IR intubated.  TACO- resolved - Full vent support - decrease propofol, add fentanyl as agitated with prop wean  Hemorrhagic shock Variceal bleed- post banding P: - PPI/octreotide - GI recommending non-selective BB after 5 days octreotide.  - Repeat EGD 3 weeks. - Trend hemoglobin,  transfuse for less than 7  Hx cirrhosis 2/2 EtOH Portal gastropathy S/p TIPS 4/27 P: - Empiric CTX has been stopped - GI following - OF placed, resume lactulose  Hx Systolic congestive heart failure (EF 50-55% with WMA 02/04/23), HoTN CAD, HTN, HLD P: - Continuous Telemetry  - Continue home Midodrine - Hold home Rosuvastatin, Spironolactone,  Torsemide -IV Lasix as appears grossly volume overloaded -Minimize IV fluids  Hx adrenal insufficiency  -IV hydrocortisone 50 mg daily in lieu of home oral Solu-Cortef (15mg  daily) > wean as tolerated  Acute Metabolic encephalopathy Hx polysubstance abuse including EtOH - Thiamine/folate,  - Monitor for s/s of w/d   Best Practice (right click and "Reselect all SmartList Selections" daily)    Diet/type: NPO, start tube feeds DVT prophylaxis: SCD GI prophylaxis: PPI Lines: Central line Foley:  N/A Code Status:  full code Last date of multidisciplinary goals of care discussion: n/a  CRITICAL CARE Performed by: Karren Burly   Total critical care time: 32 minutes  Critical care time was exclusive of separately billable procedures and treating other patients.  Critical care was necessary to treat or prevent imminent or life-threatening deterioration.  Critical care was time spent personally by me on the following activities: development of treatment plan with patient and/or surrogate as well as nursing, discussions with consultants, evaluation of patient's response to treatment, examination of patient, obtaining history from patient or surrogate, ordering and performing treatments and interventions, ordering and review of laboratory studies, ordering and review of radiographic studies, pulse oximetry and re-evaluation of patient's condition.   Karren Burly, MD [facility]  See Amion for personal pager PCCM on call pager [phone] until 7pm. Please call Elink 7p-7a. [phone]  04/12/2023 12:02 PM

## 2023-04-12 NOTE — Progress Notes (Signed)
eLink Physician-Brief Progress Note Patient Name: Ethan Wells DOB: 22-Aug-1971 MRN: [id number]   Date of Service  04/12/2023  HPI/Events of Note  52 yr old male with alcoholic cirrhosis admitted with variceal bleed transferred to hospitalist service yesterday now s/p TIPS.  Patient intubated for the procedure and left intubated following.  Following procedure hemodynamically stable on propofol drip.  eICU Interventions  Chart reviewed.  If remains stable work towards extubation.     Intervention Category Intermediate Interventions: Other:  [name], P 04/12/2023, 12:02 AM

## 2023-04-12 NOTE — Progress Notes (Addendum)
eLink Physician-Brief Progress Note Patient Name: Ethan Wells DOB: 1971/11/11 MRN: [id number]   Date of Service  04/12/2023  HPI/Events of Note  K 2.7 and mg 1.7.  creat 0.66  eICU Interventions  Replaced Repeat bmp 1400     Intervention Category Intermediate Interventions: Electrolyte abnormality - evaluation and management  [name], P 04/12/2023, 6:44 AM

## 2023-04-13 ENCOUNTER — Inpatient Hospital Stay (HOSPITAL_COMMUNITY): Payer: Medicare HMO

## 2023-04-13 ENCOUNTER — Encounter (HOSPITAL_COMMUNITY): Payer: Self-pay | Admitting: Gastroenterology

## 2023-04-13 DIAGNOSIS — Z9911 Dependence on respirator [ventilator] status: Secondary | ICD-10-CM | POA: Diagnosis not present

## 2023-04-13 DIAGNOSIS — J9601 Acute respiratory failure with hypoxia: Secondary | ICD-10-CM | POA: Diagnosis not present

## 2023-04-13 DIAGNOSIS — K922 Gastrointestinal hemorrhage, unspecified: Secondary | ICD-10-CM | POA: Diagnosis not present

## 2023-04-13 LAB — CBC
HCT: 29.2 % — ABNORMAL LOW (ref 39.0–52.0)
Hemoglobin: 9.7 g/dL — ABNORMAL LOW (ref 13.0–17.0)
MCH: 29.3 pg (ref 26.0–34.0)
MCHC: 33.2 g/dL (ref 30.0–36.0)
MCV: 88.2 fL (ref 80.0–100.0)
Platelets: 151 10*3/uL (ref 150–400)
RBC: 3.31 MIL/uL — ABNORMAL LOW (ref 4.22–5.81)
RDW: 17.8 % — ABNORMAL HIGH (ref 11.5–15.5)
WBC: 18.7 10*3/uL — ABNORMAL HIGH (ref 4.0–10.5)
nRBC: 0.2 % (ref 0.0–0.2)

## 2023-04-13 LAB — BASIC METABOLIC PANEL
Anion gap: 10 (ref 5–15)
BUN: 6 mg/dL (ref 6–20)
CO2: 27 mmol/L (ref 22–32)
Calcium: 7.9 mg/dL — ABNORMAL LOW (ref 8.9–10.3)
Chloride: 105 mmol/L (ref 98–111)
Creatinine, Ser: 0.7 mg/dL (ref 0.61–1.24)
GFR, Estimated: >60 mL/min (ref >60)
Glucose, Bld: 114 mg/dL — ABNORMAL HIGH (ref 70–99)
Potassium: 2.8 mmol/L — ABNORMAL LOW (ref 3.5–5.1)
Sodium: 142 mmol/L (ref 135–145)

## 2023-04-13 LAB — GLUCOSE, CAPILLARY
Glucose-Capillary: 111 mg/dL — ABNORMAL HIGH (ref 70–99)
Glucose-Capillary: 115 mg/dL — ABNORMAL HIGH (ref 70–99)
Glucose-Capillary: 132 mg/dL — ABNORMAL HIGH (ref 70–99)
Glucose-Capillary: 158 mg/dL — ABNORMAL HIGH (ref 70–99)
Glucose-Capillary: 153 mg/dL — ABNORMAL HIGH (ref 70–99)
Glucose-Capillary: 119 mg/dL — ABNORMAL HIGH (ref 70–99)

## 2023-04-13 LAB — CULTURE, RESPIRATORY W GRAM STAIN

## 2023-04-13 LAB — MAGNESIUM: Magnesium: 1.9 mg/dL (ref 1.7–2.4)

## 2023-04-13 LAB — PHOSPHORUS: Phosphorus: 2.5 mg/dL (ref 2.5–4.6)

## 2023-04-13 MED ORDER — ARFORMOTEROL TARTRATE 15 MCG/2ML IN NEBU
15.0000 ug | INHALATION_SOLUTION | Freq: Two times a day (BID) | RESPIRATORY_TRACT | Status: DC
  Administered 2023-04-13 – 2023-04-28 (×30): 15 ug via RESPIRATORY_TRACT
  Filled 2023-04-13 (×32): qty 2

## 2023-04-13 MED ORDER — POTASSIUM & SODIUM PHOSPHATES 280-160-250 MG PO PACK
2.0000 | PACK | Freq: Two times a day (BID) | ORAL | Status: AC
  Administered 2023-04-13 (×2): 2
  Filled 2023-04-13: qty 2

## 2023-04-13 MED ORDER — FUROSEMIDE 10 MG/ML IJ SOLN
60.0000 mg | Freq: Four times a day (QID) | INTRAMUSCULAR | Status: AC
  Administered 2023-04-13 (×2): 60 mg via INTRAVENOUS
  Filled 2023-04-13 (×2): qty 6

## 2023-04-13 MED ORDER — POTASSIUM CHLORIDE CRYS ER 20 MEQ PO TBCR
40.0000 meq | EXTENDED_RELEASE_TABLET | ORAL | Status: AC
  Administered 2023-04-13 (×2): 40 meq via ORAL
  Filled 2023-04-13 (×2): qty 2

## 2023-04-13 MED ORDER — POTASSIUM CHLORIDE 10 MEQ/50ML IV SOLN
10.0000 meq | INTRAVENOUS | Status: AC
  Administered 2023-04-13 (×4): 10 meq via INTRAVENOUS
  Filled 2023-04-13 (×4): qty 50

## 2023-04-13 MED ORDER — SPIRONOLACTONE 12.5 MG HALF TABLET
12.5000 mg | ORAL_TABLET | Freq: Every day | ORAL | Status: DC
  Administered 2023-04-13 – 2023-04-25 (×12): 12.5 mg
  Filled 2023-04-13 (×12): qty 1

## 2023-04-13 MED ORDER — CHLORHEXIDINE GLUCONATE CLOTH 2 % EX PADS
6.0000 | MEDICATED_PAD | CUTANEOUS | Status: DC
  Administered 2023-04-14 – 2023-04-26 (×15): 6 via TOPICAL

## 2023-04-13 MED ORDER — ROSUVASTATIN CALCIUM 20 MG PO TABS
40.0000 mg | ORAL_TABLET | Freq: Every day | ORAL | Status: DC
  Administered 2023-04-13 – 2023-04-25 (×13): 40 mg
  Filled 2023-04-13 (×14): qty 2

## 2023-04-13 MED ORDER — REVEFENACIN 175 MCG/3ML IN SOLN
175.0000 ug | Freq: Every day | RESPIRATORY_TRACT | Status: DC
  Administered 2023-04-13 – 2023-04-28 (×14): 175 ug via RESPIRATORY_TRACT
  Filled 2023-04-13 (×17): qty 3

## 2023-04-13 MED ORDER — MAGNESIUM SULFATE 2 GM/50ML IV SOLN
2.0000 g | Freq: Once | INTRAVENOUS | Status: AC
  Administered 2023-04-13: 2 g via INTRAVENOUS
  Filled 2023-04-13: qty 50

## 2023-04-13 MED ORDER — MIDODRINE HCL 5 MG PO TABS
10.0000 mg | ORAL_TABLET | Freq: Three times a day (TID) | ORAL | Status: DC
  Administered 2023-04-13 – 2023-04-14 (×2): 10 mg via ORAL
  Filled 2023-04-13 (×2): qty 2

## 2023-04-13 MED ORDER — HYDROCORTISONE 5 MG PO TABS
15.0000 mg | ORAL_TABLET | Freq: Every day | ORAL | Status: DC
  Filled 2023-04-13: qty 1

## 2023-04-13 MED ORDER — POTASSIUM CHLORIDE 20 MEQ PO PACK
40.0000 meq | PACK | Freq: Once | ORAL | Status: AC
  Administered 2023-04-13: 40 meq
  Filled 2023-04-13: qty 2

## 2023-04-13 NOTE — Progress Notes (Signed)
NAME:  Ethan Wells, MRN:  [id number], DOB:  Sep 14, 1971, LOS: 6 ADMISSION DATE:  04/07/2023, CONSULTATION DATE:  04/07/2023 REFERRING MD:  Dr. Arnoldo Morale, CHIEF COMPLAINT:  Upper GI bleed    History of Present Illness:  Ethan Wells is a 52 yo male with a PMH significant for recurrent GI bleed with known variceal bleed, ischemic cardiomyopathy, EtOH abuse, polysubstance abuse, HTN, HLD, depression, and anxiety who presented to [facility] ED 4/22 for acute GI bleed. Of note patient was seen within this health sytem 1 month ago for significant GI bleed requiring banding per GI with TIPS procedure recommended but patient declined. Patient reports last drink and cocaine use was day prior to admission.  On ED arrival patient was seen hypotensive and mildly tachycardic. Lab work significant for K 2.3, glucoses 140, Mg 1.2, alkaline phos 153, albumin 2.3, AST 44, total protein 5.6, total bili 1.6, lactic 5.6, hgb 7.1, and hct 23.2. Given active bleed with compromised hemodynamics, PCCM consulted for further management and admission.   Pertinent  Medical History  Recurrent GI bleed with known variceal bleed, ischemic cardiomyopathy, EtOH abuse, polysubstance abuse, HTN, HLD, depression, and anxiety   Significant Hospital Events: Including procedures, antibiotic start and stop dates in addition to other pertinent events   4/22 admitted with acute GI bleed  4/23 EGD >> X esophagus, grade 2 varices, banded x 2, portal gastropathy 4/23 intubated for flash pulm edema , started on Lasix and proned 4/25 extubated 4/27 TIPS, re intubated for procedure. Did not meet extubation criteria and was returned to ICU on vent.   Interim History / Subjective:  Remains intubated. Tmax 100.3.   Objective   Blood pressure (!) 102/52, pulse 91, temperature 100.3 F (37.9 C), temperature source Axillary, resp. rate 13, height 5\' 5"  (1.651 m), weight 91.6 kg, SpO2 94 %.    Vent Mode: PSV;CPAP FiO2 (%):  [40 %] 40  % Set Rate:  [16 bmp-18 bmp] 16 bmp Vt Set:  [490 mL] 490 mL PEEP:  [5 cmH20] 5 cmH20 Pressure Support:  [5 cmH20-10 cmH20] 5 cmH20 Plateau Pressure:  [20 cmH20-21 cmH20] 21 cmH20   Intake/Output Summary (Last 24 hours) at 04/13/2023 1047 Last data filed at 04/13/2023 1000 Gross per 24 hour  Intake 2358.1 ml  Output 2380 ml  Net -21.9 ml    Filed Weights   04/11/23 0500 04/12/23 0500 04/13/23 0209  Weight: 89.5 kg 95.4 kg 91.6 kg    Examination: General:  critically ill appearing man lying in bed in NAD Neuro:  RASS -3, not following commands, moving arms spontaneously HEENT:  [place]/AT, eyes anicteric, ETT Cardiovascular:  S1S2, RRR Lungs:  expiratory abdominal accessory muscle use, CTA Abdomen: soft, NT Musculoskeletal:  ++ LE edema Skin: warm, dry, no diffuse rashes  K+ 2.8 BUN 6 Cr 0.7 WBC 18.7 H/H 9.7/29.2 Platelets 151   Assessment & Plan:   Acute respiratory failure with hypoxia: extubated 4/25, but reintubated 4/27 for TIPS. Returned from IR intubated.  TACO, resolved -LTVV -needs additional diuresis prior to extubation -VAP prevention protocol -PAD protocol for sedation -adding long-acting bronchodilators; brovana & yupelri -daily SAT & SBT -CXR  Leukocytosis, new onset -trach aspirate culture -CXR  Hypokalemia -repleted -con't to monitor  Hemorrhagic shock Variceal bleed- post banding - con't PPI Q12h, octreotide - nonselective Bblocker after octreotide as BP allows - Repeat surveillance EGD in 3 weeks. - transfuse for Hb <7 or hemodynamically significant bleeding  Hx cirrhosis 2/2 EtOH Portal gastropathy S/p TIPS  4/27 -completed empiric ceftriaxone -appreciate GI's management -lactulose -PPI  Hx Systolic congestive heart failure (EF 50-55% with WMA 02/04/23), HoTN CAD, HTN, HLD P: -PTA midodrine -lasix -monitor on tele -resume PTA spironolactone - hold PTA torsemide; using IV diuretics currently -minimize IVF  Hx adrenal  insufficiency  -switch back to PTA hydrocortisone  Acute metabolic encephalopathy Hx polysubstance abuse including EtOH - vitamins -monitor for signs of withdrawal; on propofol which should reduce risk of withdrawal.   Best Practice (right click and "Reselect all SmartList Selections" daily)   Diet/type: NPO, start tube feeds DVT prophylaxis: SCD GI prophylaxis: PPI Lines: Central line Foley:  N/A Code Status:  full code Last date of multidisciplinary goals of care discussion: n/a   This patient is critically ill with multiple organ system failure which requires frequent high complexity decision making, assessment, support, evaluation, and titration of therapies. This was completed through the application of advanced monitoring technologies and extensive interpretation of multiple databases. During this encounter critical care time was devoted to patient care services described in this note for 35 minutes.  Steffanie Dunn, DO 04/13/23 11:06 AM [facility]  For contact information, see Amion. If no response to pager, please call PCCM consult pager. After hours, 7PM- 7AM, please call Elink.

## 2023-04-13 NOTE — Anesthesia Postprocedure Evaluation (Signed)
Anesthesia Post Note  [name]  Procedure(s) Performed: IR WITH ANESTHESIA     Patient location during evaluation: SICU Anesthesia Type: General Level of consciousness: sedated Pain management: pain level controlled Vital Signs Assessment: post-procedure vital signs reviewed and stable Respiratory status: patient remains intubated per anesthesia plan Cardiovascular status: stable Postop Assessment: no apparent nausea or vomiting Anesthetic complications: no   No notable events documented.  Last Vitals:  Vitals:   04/13/23 2030 04/13/23 2045  BP: 128/73 126/65  Pulse: 97 88  Resp: 15 15  Temp:    SpO2: 96% 96%    Last Pain:  Vitals:   04/13/23 1929  TempSrc: Axillary  PainSc:                  Collene Schlichter

## 2023-04-13 NOTE — Progress Notes (Signed)
[facility] ADULT ICU REPLACEMENT PROTOCOL   The patient does apply for the [facility] Adult ICU Electrolyte Replacment Protocol based on the criteria listed below:   1.Exclusion criteria: TCTS, ECMO, Dialysis, and Myasthenia Gravis patients 2. Is GFR >/= 30 ml/min? Yes.    Patient's GFR today is >60 3. Is SCr </= 2? Yes.   Patient's SCr is 0.70 mg/dL 4. Did SCr increase >/= 0.5 in 24 hours? No. 5.Pt's weight >40kg  Yes.   6. Abnormal electrolyte(s): Potassium 2.8, Magnesium 1.9  7. Electrolytes replaced per protocol 8.  Call MD STAT for K+ </= 2.5, Phos </= 1, or Mag </= 1 Physician:  Dr. Agapito Games A Edwina Grossberg 04/13/2023 4:58 AM

## 2023-04-14 DIAGNOSIS — J9601 Acute respiratory failure with hypoxia: Secondary | ICD-10-CM | POA: Diagnosis not present

## 2023-04-14 DIAGNOSIS — J9691 Respiratory failure, unspecified with hypoxia: Secondary | ICD-10-CM

## 2023-04-14 DIAGNOSIS — D62 Acute posthemorrhagic anemia: Secondary | ICD-10-CM

## 2023-04-14 LAB — BASIC METABOLIC PANEL
Anion gap: 6 (ref 5–15)
BUN: 7 mg/dL (ref 6–20)
CO2: 28 mmol/L (ref 22–32)
Calcium: 7.8 mg/dL — ABNORMAL LOW (ref 8.9–10.3)
Chloride: 107 mmol/L (ref 98–111)
Creatinine, Ser: 0.74 mg/dL (ref 0.61–1.24)
GFR, Estimated: >60 mL/min (ref >60)
Glucose, Bld: 124 mg/dL — ABNORMAL HIGH (ref 70–99)
Potassium: 3.2 mmol/L — ABNORMAL LOW (ref 3.5–5.1)
Sodium: 141 mmol/L (ref 135–145)

## 2023-04-14 LAB — GLUCOSE, CAPILLARY
Glucose-Capillary: 117 mg/dL — ABNORMAL HIGH (ref 70–99)
Glucose-Capillary: 110 mg/dL — ABNORMAL HIGH (ref 70–99)
Glucose-Capillary: 123 mg/dL — ABNORMAL HIGH (ref 70–99)
Glucose-Capillary: 141 mg/dL — ABNORMAL HIGH (ref 70–99)
Glucose-Capillary: 187 mg/dL — ABNORMAL HIGH (ref 70–99)
Glucose-Capillary: 115 mg/dL — ABNORMAL HIGH (ref 70–99)

## 2023-04-14 LAB — CBC
HCT: 29.9 % — ABNORMAL LOW (ref 39.0–52.0)
Hemoglobin: 9.6 g/dL — ABNORMAL LOW (ref 13.0–17.0)
MCH: 29.1 pg (ref 26.0–34.0)
MCHC: 32.1 g/dL (ref 30.0–36.0)
MCV: 90.6 fL (ref 80.0–100.0)
Platelets: 143 10*3/uL — ABNORMAL LOW (ref 150–400)
RBC: 3.3 MIL/uL — ABNORMAL LOW (ref 4.22–5.81)
RDW: 18.2 % — ABNORMAL HIGH (ref 11.5–15.5)
WBC: 16.6 10*3/uL — ABNORMAL HIGH (ref 4.0–10.5)
nRBC: 0 % (ref 0.0–0.2)

## 2023-04-14 LAB — CULTURE, RESPIRATORY W GRAM STAIN

## 2023-04-14 LAB — POTASSIUM: Potassium: 3.9 mmol/L (ref 3.5–5.1)

## 2023-04-14 LAB — MAGNESIUM: Magnesium: 2.2 mg/dL (ref 1.7–2.4)

## 2023-04-14 MED ORDER — ADULT MULTIVITAMIN W/MINERALS CH
1.0000 | ORAL_TABLET | Freq: Every day | ORAL | Status: DC
  Administered 2023-04-14 – 2023-04-25 (×12): 1
  Filled 2023-04-14 (×12): qty 1

## 2023-04-14 MED ORDER — GABAPENTIN 300 MG PO CAPS
300.0000 mg | ORAL_CAPSULE | Freq: Three times a day (TID) | ORAL | Status: DC
  Administered 2023-04-14: 300 mg
  Filled 2023-04-14: qty 1

## 2023-04-14 MED ORDER — GABAPENTIN 300 MG PO CAPS
300.0000 mg | ORAL_CAPSULE | Freq: Every day | ORAL | Status: DC
  Administered 2023-04-15 – 2023-04-25 (×11): 300 mg
  Filled 2023-04-14 (×11): qty 1

## 2023-04-14 MED ORDER — LACTULOSE 10 GM/15ML PO SOLN
30.0000 g | Freq: Three times a day (TID) | ORAL | Status: DC
  Administered 2023-04-14: 30 g
  Filled 2023-04-14: qty 45

## 2023-04-14 MED ORDER — POTASSIUM CHLORIDE 10 MEQ/50ML IV SOLN
10.0000 meq | INTRAVENOUS | Status: AC
  Administered 2023-04-14 (×6): 10 meq via INTRAVENOUS
  Filled 2023-04-14 (×6): qty 50

## 2023-04-14 MED ORDER — THIAMINE MONONITRATE 100 MG PO TABS
100.0000 mg | ORAL_TABLET | Freq: Every day | ORAL | Status: DC
  Administered 2023-04-14 – 2023-04-25 (×12): 100 mg
  Filled 2023-04-14 (×12): qty 1

## 2023-04-14 MED ORDER — HYDROCORTISONE 10 MG PO TABS
10.0000 mg | ORAL_TABLET | Freq: Every day | ORAL | Status: DC
  Administered 2023-04-14 – 2023-04-25 (×12): 10 mg
  Filled 2023-04-14 (×12): qty 1

## 2023-04-14 MED ORDER — VITAL HIGH PROTEIN PO LIQD: 1000.0000 mL | ORAL | Status: DC

## 2023-04-14 MED ORDER — VITAL 1.5 CAL PO LIQD
1000.0000 mL | ORAL | Status: DC
  Administered 2023-04-14 – 2023-04-22 (×6): 1000 mL
  Filled 2023-04-14 (×12): qty 1000

## 2023-04-14 MED ORDER — DEXMEDETOMIDINE HCL IN NACL 400 MCG/100ML IV SOLN
0.0000 ug/kg/h | INTRAVENOUS | Status: DC
  Administered 2023-04-14: 0.4 ug/kg/h via INTRAVENOUS
  Administered 2023-04-15: 0.5 ug/kg/h via INTRAVENOUS
  Administered 2023-04-15: 0.6 ug/kg/h via INTRAVENOUS
  Administered 2023-04-15: 0.4 ug/kg/h via INTRAVENOUS
  Administered 2023-04-16: 1 ug/kg/h via INTRAVENOUS
  Administered 2023-04-16: 0.8 ug/kg/h via INTRAVENOUS
  Filled 2023-04-14 (×5): qty 100

## 2023-04-14 MED ORDER — QUETIAPINE FUMARATE 25 MG PO TABS
25.0000 mg | ORAL_TABLET | Freq: Every day | ORAL | Status: DC
  Administered 2023-04-14 – 2023-04-15 (×2): 25 mg
  Filled 2023-04-14 (×2): qty 1

## 2023-04-14 MED ORDER — PROSOURCE TF20 ENFIT COMPATIBL EN LIQD
60.0000 mL | Freq: Every day | ENTERAL | Status: DC
  Administered 2023-04-14 – 2023-04-27 (×13): 60 mL
  Filled 2023-04-14 (×9): qty 60

## 2023-04-14 MED ORDER — POTASSIUM CHLORIDE 20 MEQ PO PACK
40.0000 meq | PACK | Freq: Once | ORAL | Status: AC
  Administered 2023-04-14: 40 meq
  Filled 2023-04-14: qty 2

## 2023-04-14 MED ORDER — HYDROMORPHONE HCL 1 MG/ML IJ SOLN: 1.0000 mg | Freq: Once | INTRAMUSCULAR | Status: DC | PRN

## 2023-04-14 MED ORDER — FUROSEMIDE 10 MG/ML IJ SOLN
80.0000 mg | Freq: Two times a day (BID) | INTRAMUSCULAR | Status: AC
  Administered 2023-04-14 (×2): 80 mg via INTRAVENOUS
  Filled 2023-04-14 (×2): qty 8

## 2023-04-14 MED ORDER — HYDROMORPHONE HCL 1 MG/ML IJ SOLN: 1.0000 mg | INTRAMUSCULAR | Status: DC | PRN

## 2023-04-14 MED ORDER — HYDROCORTISONE 5 MG PO TABS
5.0000 mg | ORAL_TABLET | Freq: Every day | ORAL | Status: DC
  Administered 2023-04-14 – 2023-04-25 (×12): 5 mg
  Filled 2023-04-14 (×12): qty 1

## 2023-04-14 MED ORDER — LACTULOSE 10 GM/15ML PO SOLN
30.0000 g | Freq: Once | ORAL | Status: AC
  Administered 2023-04-14: 30 g
  Filled 2023-04-14: qty 45

## 2023-04-14 MED ORDER — MIDODRINE HCL 5 MG PO TABS
10.0000 mg | ORAL_TABLET | Freq: Three times a day (TID) | ORAL | Status: DC
  Administered 2023-04-14 – 2023-04-16 (×6): 10 mg
  Filled 2023-04-14 (×6): qty 2

## 2023-04-14 MED ORDER — FOLIC ACID 1 MG PO TABS
1.0000 mg | ORAL_TABLET | Freq: Every day | ORAL | Status: DC
  Administered 2023-04-14 – 2023-04-25 (×12): 1 mg
  Filled 2023-04-14 (×12): qty 1

## 2023-04-14 MED ORDER — LACTULOSE 10 GM/15ML PO SOLN
60.0000 g | Freq: Three times a day (TID) | ORAL | Status: DC
  Administered 2023-04-14 – 2023-04-15 (×5): 60 g
  Filled 2023-04-14 (×5): qty 90

## 2023-04-14 NOTE — TOC Progression Note (Signed)
Transition of Care [facility]) - Progression Note    Patient Details  Name: NAYQUAN EVINGER MRN: [id number] Date of Birth: 12/27/1970  Transition of Care [facility] CM/SW Contact  Tom-Johnson, Hershal Coria, RN Phone Number: 04/14/2023, 2:53 PM  Clinical Narrative:     Patient intubated at this time. Hgb on admit was 7.1. Today hgb at 9.6.  Had TIPS procedure on 04/11/23 by IR. GI following.  HHPT recommended, CM will f/u agency referral once patient is stable.       Expected Discharge Plan and Services                                               Social Determinants of Health (SDOH) Interventions SDOH Screenings   Food Insecurity: No Food Insecurity (04/07/2023)  Housing: Low Risk  (04/07/2023)  Transportation Needs: No Transportation Needs (04/07/2023)  Utilities: Not At Risk (04/07/2023)  Alcohol Screen: Medium Risk (10/01/2022)  Tobacco Use: High Risk (04/13/2023)    Readmission Risk Interventions    02/24/2023    3:12 PM 01/17/2023    3:38 PM 10/22/2022   10:47 AM  Readmission Risk Prevention Plan  Transportation Screening Complete Complete Complete  PCP or Specialist Appt within 3-5 Days   Complete  Social Work Consult for Recovery Care Planning/Counseling   Complete  Palliative Care Screening   Not Applicable  Medication Review [employer]) Complete Complete Complete  SW Recovery Care/Counseling Consult  Not Complete   SW Consult Not Complete Comments  lethargic   Palliative Care Screening Not Applicable Not Applicable   Skilled Nursing Facility Not Applicable Not Applicable

## 2023-04-14 NOTE — Progress Notes (Signed)
NAME:  Ethan Wells, MRN:  [id number], DOB:  May 18, 1971, LOS: 7 ADMISSION DATE:  04/07/2023, CONSULTATION DATE:  04/07/2023 REFERRING MD:  Dr. Arnoldo Morale, CHIEF COMPLAINT:  Upper GI bleed    History of Present Illness:  Ethan Wells is a 52 yo male with a PMH significant for recurrent GI bleed with known variceal bleed, ischemic cardiomyopathy, EtOH abuse, polysubstance abuse, HTN, HLD, depression, and anxiety who presented to [facility] ED 4/22 for acute GI bleed. Of note patient was seen within this health sytem 1 month ago for significant GI bleed requiring banding per GI with TIPS procedure recommended but patient declined. Patient reports last drink and cocaine use was day prior to admission.  On ED arrival patient was seen hypotensive and mildly tachycardic. Lab work significant for K 2.3, glucoses 140, Mg 1.2, alkaline phos 153, albumin 2.3, AST 44, total protein 5.6, total bili 1.6, lactic 5.6, hgb 7.1, and hct 23.2. Given active bleed with compromised hemodynamics, PCCM consulted for further management and admission.   Pertinent  Medical History  Recurrent GI bleed with known variceal bleed, ischemic cardiomyopathy, EtOH abuse, polysubstance abuse, HTN, HLD, depression, and anxiety   Significant Hospital Events: Including procedures, antibiotic start and stop dates in addition to other pertinent events   4/22 admitted with acute GI bleed  4/23 EGD >> X esophagus, grade 2 varices, banded x 2, portal gastropathy 4/23 intubated for flash pulm edema , started on Lasix and proned 4/25 extubated 4/27 TIPS, re intubated for procedure. Did not meet extubation criteria and was returned to ICU on vent.  4/29 remains intubated, mental status precluding extubation  Interim History / Subjective:  No overnight events Remains on low dose levophed Diuresed 3L yesterday Remains encephalopathic with SAT   Objective   Blood pressure (!) 106/54, pulse 83, temperature (!) 97.2 F (36.2 C),  temperature source Axillary, resp. rate 13, height 5\' 5"  (1.651 m), weight 92.3 kg, SpO2 97 %.    Vent Mode: PRVC FiO2 (%):  [40 %] 40 % Set Rate:  [16 bmp] 16 bmp Vt Set:  [490 mL] 490 mL PEEP:  [5 cmH20] 5 cmH20 Pressure Support:  [5 cmH20] 5 cmH20 Plateau Pressure:  [16 cmH20-18 cmH20] 17 cmH20   Intake/Output Summary (Last 24 hours) at 04/14/2023 0827 Last data filed at 04/14/2023 0800 Gross per 24 hour  Intake 2076.28 ml  Output 3310 ml  Net -1233.72 ml    Filed Weights   04/13/23 0209 04/14/23 0021 04/14/23 0105  Weight: 91.6 kg 92.3 kg 92.3 kg   General:  critically ill appearing M intubated and sedated  HEENT: MM pink/moist, sclera anicteric Neuro: opens eyes to voice on low dose fentanyl, not otherwise following commands CV: s1s2 rrr, no m/r/g PULM:  mildly diminished bilaterally, no rhonchi or wheezing GI: soft, mildly distended, tips surgical site bandaged without bleeding or drainage Extremities: warm/dry, 2+ edema  Skin: no rashes or lesions    K+ 3.2 BUN 7 Cr 0.7 WBC 16.6 H/H 9.6/29.9 Platelets 143   Assessment & Plan:   Acute respiratory failure with hypoxia Possible TACO, hazy infiltrates on CXR  extubated 4/25, but reintubated 4/27 for TIPS. Returned from IR intubated.  -mental status precludes extubation today -additional lasix 80mg  bid -Maintain full vent support with SAT/SBT as tolerated -titrate Vent setting to maintain SpO2 greater than or equal to 90%. -HOB elevated 30 degrees. -Plateau pressures less than 30 cm H20.  -Follow chest x-ray, ABG prn.   -Bronchial hygiene and  RT/bronchodilator protocol.  Leukocytosis, new onset -follow trach culture, monitor for fever  -CXR  Hypokalemia K  3.2 -repleted, check repeat level this afternoon -con't to monitor  Hemorrhagic shock Variceal bleed- post banding Remains on low dose levophed - con't PPI Q12h, octreotide - nonselective Bblocker after octreotide as BP allows - Repeat  surveillance EGD in 3 weeks. - transfuse for Hb <7 or hemodynamically significant bleeding  Hx cirrhosis 2/2 EtOH Portal gastropathy S/p TIPS 4/27 -completed empiric ceftriaxone -appreciate GI's management -lactulose, increased to 60mg , needs 2-3 BM's per day -PPI  Hx Systolic congestive heart failure (EF 50-55% with WMA 02/04/23), HoTN CAD, HTN, HLD P: -PTA midodrine -lasix -monitor on tele -resume PTA spironolactone - hold PTA torsemide; using IV diuretics currently -minimize IVF  Hx adrenal insufficiency  -continue home hydrocort  Acute metabolic encephalopathy Hx polysubstance abuse including EtOH - vitamins -monitor for signs of withdrawal; admitted 6 days so should be moving out of the window   Best Practice (right click and "Reselect all SmartList Selections" daily)   Diet/type: NPO, start tube feeds DVT prophylaxis: SCD GI prophylaxis: PPI Lines: Central line Foley:  N/A Code Status:  full code Last date of multidisciplinary goals of care discussion: pending, will attempt to contact family   CRITICAL CARE Performed by: Darcella Gasman Milaya Hora   Total critical care time: 35 minutes  Critical care time was exclusive of separately billable procedures and treating other patients.  Critical care was necessary to treat or prevent imminent or life-threatening deterioration.  Critical care was time spent personally by me on the following activities: development of treatment plan with patient and/or surrogate as well as nursing, discussions with consultants, evaluation of patient's response to treatment, examination of patient, obtaining history from patient or surrogate, ordering and performing treatments and interventions, ordering and review of laboratory studies, ordering and review of radiographic studies, pulse oximetry and re-evaluation of patient's condition.   Darcella Gasman Lya Holben, PA-C [facility] See Amion for pager If no response to pager ,  please call [phone] until 7pm After 7:00 pm call Elink  [phone]

## 2023-04-14 NOTE — Progress Notes (Signed)
[facility] ADULT ICU REPLACEMENT PROTOCOL   The patient does apply for the [facility] Adult ICU Electrolyte Replacment Protocol based on the criteria listed below:   1.Exclusion criteria: TCTS, ECMO, Dialysis, and Myasthenia Gravis patients 2. Is GFR >/= 30 ml/min? Yes.    Patient's GFR today is >60 3. Is SCr </= 2? Yes.   Patient's SCr is 0.74 mg/dL 4. Did SCr increase >/= 0.5 in 24 hours? No. 5.Pt's weight >40kg  Yes.   6. Abnormal electrolyte(s): k 3.2  7. Electrolytes replaced per protocol 8.  Call MD STAT for K+ </= 2.5, Phos </= 1, or Mag </= 1 Physician:    Darrick Grinder 04/14/2023 6:18 AM '

## 2023-04-14 NOTE — Progress Notes (Addendum)
Attending physician's note   I have taken a history, reviewed the chart, and examined the patient. I performed a substantive portion of this encounter, including complete performance of at least one of the key components, in conjunction with the APP. I agree with the APP's note, impression, and recommendations with my edits.   Intubated, briefly opens eyes to command on my exam.    We typically do not pursue repeat upper endoscopy for surveillance after successful TIPS.  Would be better to assess TIPS patency in a couple of weeks, and if appropriate function, would obviate the need for surveillance EGDs.  Similarly, can typically stop beta-blockers after successful TIPS.  Currently holding in light of hypotension, but may not need to restart at all - Resume high-dose PPI - Diuretics as BP allows.  Again this should hopefully be more so from a pulmonary edema standpoint as ascites should not be a major factor after TIPS - Continue lactulose - Consider starting rifaximin when tolerating p.o. and if hyperammonemia becomes an issue in the post-TIPS setting - Inpatient GI service will sign off at this time.  Please do not hesitate to contact us with additional questions or concerns   Adalynd Donahoe, DO, FACG [phone] office          Progress Note  Primary GI: Dr. Allegra Lai  LOS: 7 days   Chief Complaint:GI bleed   Subjective   Intubated and sedated. Does not follow commands. Bedside RN states they will hopefully extubate today. No further bleeding.  No family was present at the time of my evaluation.   Objective   Vital signs in last 24 hours: Temp:  [97.2 F (36.2 C)-99.9 F (37.7 C)] 97.2 F (36.2 C) (04/29 0746) Pulse Rate:  [70-101] 83 (04/29 0800) Resp:  [8-23] 13 (04/29 0800) BP: (87-152)/(48-91) 106/54 (04/29 0800) SpO2:  [93 %-97 %] 97 % (04/29 0800) Arterial Line BP: (79-152)/(40-78) 103/45 (04/29 0800) FiO2 (%):  [40 %] 40 % (04/29 0800) Weight:  [92.3  kg] 92.3 kg (04/29 0105) Last BM Date : 04/12/23 Last BM recorded by nurses in past 5 days Stool Type: Type 6 (Mushy consistency with ragged edges) (04/12/2023 11:18 PM)  General:   male intubated, does not follow commands Heart:  Regular rate and rhythm; no murmurs Pulm: ventilated breath sounds Abdomen: soft, nondistended, normal bowel sounds in all quadrants. Nontender without guarding. No organomegaly appreciated. Extremities: SCDs and compression socks Neurologic: does not follow commands  Intake/Output from previous day: 04/28 0701 - 04/29 0700 In: 2153.5 [I.V.:1415.1; NG/GT:530; IV Piggyback:208.4] Out: 3260 [Urine:3060; Emesis/NG output:200] Intake/Output this shift: Total I/O In: 71 [I.V.:41; IV Piggyback:30] Out: 50 [Urine:50]  Studies/Results: DG CHEST PORT 1 VIEW  Result Date: 04/13/2023 CLINICAL DATA:  Fever. EXAM: PORTABLE CHEST 1 VIEW COMPARISON:  April 12, 2023 FINDINGS: Endotracheal tube in satisfactory position. Enteric catheter collimated off the image. Left-sided central venous catheter terminates at the cavoatrial juncture. Enlarged heart. Diffuse bilateral hazy and nodular opacities throughout both lungs. Osseous structures are without acute abnormality. Soft tissues are grossly normal. IMPRESSION: 1. Diffuse bilateral hazy and nodular opacities throughout both lungs. No significant change from the prior radiograph. 2. Enlarged heart. 3. Support apparatus as described above. Electronically Signed   By: Ted Mcalpine M.D.   On: 04/13/2023 15:32   DG Abd Portable 1V  Result Date: 04/12/2023 CLINICAL DATA:  Tube placement EXAM: PORTABLE ABDOMEN - 1 VIEW COMPARISON:  02/28/2023 FINDINGS: Image of the upper abdomen demonstrates N/OG  tube tip superimposed with the stomach below the diaphragm. IMPRESSION: N/OG tube in place. Electronically Signed   By: Layla Maw M.D.   On: 04/12/2023 11:06    Lab Results: Recent Labs    04/12/23 0551 04/13/23 0344  04/14/23 0332  WBC 10.4 18.7* 16.6*  HGB 9.5* 9.7* 9.6*  HCT 29.6* 29.2* 29.9*  PLT 100* 151 143*   BMET Recent Labs    04/12/23 1533 04/13/23 0344 04/14/23 0332  NA 143 142 141  K 3.8 2.8* 3.2*  CL 108 105 107  CO2 27 27 28   GLUCOSE 156* 114* 124*  BUN 5* 6 7  CREATININE 0.70 0.70 0.74  CALCIUM 7.8* 7.9* 7.8*   LFT Recent Labs    04/12/23 0551  PROT 5.3*  ALBUMIN 2.5*  AST 54*  ALT 24  ALKPHOS 110  BILITOT 3.3*   PT/INR Recent Labs    04/11/23 1002  LABPROT 17.5*  INR 1.5*     Scheduled Meds:  arformoterol  15 mcg Nebulization BID   Chlorhexidine Gluconate Cloth  6 each Topical Daily   folic acid  1 mg Per Tube Daily   gabapentin  300 mg Per Tube TID   hydrocortisone  10 mg Per Tube Daily   hydrocortisone  5 mg Per Tube Daily   insulin aspart  0-15 Units Subcutaneous Q4H   lactulose  30 g Per Tube TID   midodrine  10 mg Per Tube Q8H   multivitamin with minerals  1 tablet Per Tube Daily   mouth rinse  15 mL Mouth Rinse Q2H   pantoprazole  40 mg Intravenous Q12H   QUEtiapine  25 mg Per Tube QHS   revefenacin  175 mcg Nebulization Daily   rosuvastatin  40 mg Per Tube Daily   spironolactone  12.5 mg Per Tube Daily   thiamine  100 mg Per Tube Daily   Continuous Infusions:  sodium chloride Stopped (04/14/23 0749)   fentaNYL infusion INTRAVENOUS 50 mcg/hr (04/14/23 0800)   norepinephrine (LEVOPHED) Adult infusion Stopped (04/14/23 0755)   potassium chloride 50 mL/hr at 04/14/23 0800   propofol (DIPRIVAN) infusion 20 mcg/kg/min (04/14/23 0800)      Patient profile:   52 year old male with a history of cirrhosis and recurrent GI bleeds due to varices admitted with acute UGIB. Presented to [facility] ED 4/22 and transferred to [facility]. He underwent TIPS on 4/26. He previously was intubated for 2 days due to pulmonary edema. He tolerated extubation for 1 day prior to undergoing TIPS. He was brought back to the ICU intubated post-procedurally.     Impression:    GI bleed; secondary to esophageal varices in the setting of alcoholic cirrhosis - EGD 4/23: Known Barrett's esophagus, grade 2 esophageal varices with stigmata of recent bleeding, banded x 2.  Portal hypertensive gastropathy.  Early nonbleeding gastric varices.  No fresh blood seen - hgb 9.6, stable - MELD 3.0:17 - MELD-Na: 15 - BP 119/64 while in room, on levo - No further bleeding  Acute respiratory failure with hypoxia extubated 4/25, but reintubated 4/27 for TIPS.  Returned from IR intubated.   Hypokalemia - K 3.2, improved  Systolic congestive heart failure EtOH abuse.  Cocaine and marijuana abuse   Plan:   - Continue PPI 40mg  IV BID - nonselective beta blocker once BP improves - Repeat EGD in 2 weeks for repeat banding - Continue daily CBC and transfuse as needed to maintain HGB > 7  - Continue lactulose and titrate to  2-3 Bms per day - Replenish potassium   Bayley Leanna Sato  04/14/2023, 8:39 AM

## 2023-04-14 NOTE — Progress Notes (Signed)
Initial Nutrition Assessment  DOCUMENTATION CODES:   Not applicable  INTERVENTION:   Recommend starting enteral nutrition via OG: Start Vital 1.5 at 25 mL/hr and advance by 10 mL every 12 hours to goal rate of 55 mL/hr (1320 mL per day) 60 mL ProSource TF20 - daily Tube feeds at goal provide 2060 kcal, 109 gm protein, and 1008 mL free water daily.  Monitor magnesium, potassium, and phosphorus BID for at least 3 days, MD to replete as needed, as pt is at risk for refeeding syndrome given NPO >6 days.  If unable to tolerate enteral nutrition or extubate and tolerate advancement of diet, consider starting TPN  Continue Folic Acid, Thiamine, Multivitamin w/ minerals daily  NUTRITION DIAGNOSIS:   Inadequate oral intake related to inability to eat as evidenced by NPO status.  GOAL:   Patient will meet greater than or equal to 90% of their needs  MONITOR:   Vent status, Labs, Weight trends, I & O's  REASON FOR ASSESSMENT:   Ventilator    ASSESSMENT:   52 y.o. male presented to [facility] ED with acute GI bleed. Pt recently admitted for GI bleed one month prior. PMH includes HTN, CAD, EtOH abuse, polysubstance abuse, CHD, alcohol cirrhosis w/ esophageal varices, and HLD.   4/22 - Admitted 4/23 - Intubated 4/25 - Extubated; diet advanced to clear liquids 4/26 - NPO s/p TIPS; remained intubated post-procedure  No family at bedside during RD visit. Discussed with RN. No longer bleeding. Possible extubation today, currently not waking up with sedation held. Reached out to MD about starting enteral nutrition. Spoke with CCM PA, plan to initiate enteral nutrition today.   Patient is currently intubated on ventilator support. MV: 7 L/min MAP (cuff): 70  Temp (24hrs), Avg:98.1 F (36.7 C), Min:97.2 F (36.2 C), Max:98.7 F (37.1 C)  Drips Precedex Fentanyl (stopped) Levophed  Propofol: 10.74 ml/hr (stopped)  Medications reviewed and include: Folic acid, NovoLog SSI, Lactulose,  MVI, Protonix, Spironolactone, Thiamine, IV potassium Chloride Labs reviewed: Sodium 141, Potassium 3.2, Bun 7, Creatinine 0.74, Magnesium 2.2, 24 hr CBGs 110-158  UOP: 3060 mL x 24 hrs OG output: 200 mL x 24 hours  NUTRITION - FOCUSED PHYSICAL EXAM:  Flowsheet Row Most Recent Value  Orbital Region Unable to assess  Upper Arm Region No depletion  Thoracic and Lumbar Region No depletion  Buccal Region Unable to assess  Temple Region No depletion  Clavicle Bone Region No depletion  Clavicle and Acromion Bone Region No depletion  Scapular Bone Region No depletion  Dorsal Hand Unable to assess  Patellar Region No depletion  Anterior Thigh Region No depletion  Posterior Calf Region No depletion  Edema (RD Assessment) Mild  Hair Reviewed  Eyes Unable to assess  Mouth Unable to assess  Skin Reviewed  Nails Unable to assess   Diet Order:   Diet Order             Diet NPO time specified Except for: Sips with Meds  Diet effective now                  EDUCATION NEEDS:   Not appropriate for education at this time  Skin:  Skin Assessment: Reviewed RN Assessment  Last BM:  4/27  Height:  Ht Readings from Last 1 Encounters:  04/13/23 5\' 5"  (1.651 m)   Weight:  Wt Readings from Last 1 Encounters:  04/14/23 92.3 kg   Ideal Body Weight:  61.8 kg  BMI:  Body mass index is  33.86 kg/m.  Estimated Nutritional Needs:  Kcal:  2000-2200 Protein:  100-120 grams Fluid:  >/= 2 L   Kirby Crigler RD, LDN Clinical Dietitian See [facility] for contact information.

## 2023-04-14 NOTE — Progress Notes (Signed)
PT Cancellation Note  Patient Details Name: ODIES DESA MRN: [id number] DOB: 09-22-1971   Cancelled Treatment:    Reason Eval/Treat Not Completed: Medical issues which prohibited therapy (Pt may be extubated later today. will check back per nurse.)   Bevelyn Buckles 04/14/2023, 9:10 AM Maveric Debono M,PT Acute Rehab Services [phone]

## 2023-04-15 ENCOUNTER — Inpatient Hospital Stay (HOSPITAL_COMMUNITY): Payer: Medicare HMO

## 2023-04-15 LAB — GLUCOSE, CAPILLARY
Glucose-Capillary: 139 mg/dL — ABNORMAL HIGH (ref 70–99)
Glucose-Capillary: 150 mg/dL — ABNORMAL HIGH (ref 70–99)
Glucose-Capillary: 174 mg/dL — ABNORMAL HIGH (ref 70–99)
Glucose-Capillary: 166 mg/dL — ABNORMAL HIGH (ref 70–99)
Glucose-Capillary: 145 mg/dL — ABNORMAL HIGH (ref 70–99)
Glucose-Capillary: 148 mg/dL — ABNORMAL HIGH (ref 70–99)

## 2023-04-15 LAB — TRIGLYCERIDES: Triglycerides: 124 mg/dL (ref <150)

## 2023-04-15 LAB — CBC
HCT: 30.8 % — ABNORMAL LOW (ref 39.0–52.0)
Hemoglobin: 9.8 g/dL — ABNORMAL LOW (ref 13.0–17.0)
MCH: 29.2 pg (ref 26.0–34.0)
MCHC: 31.8 g/dL (ref 30.0–36.0)
MCV: 91.7 fL (ref 80.0–100.0)
Platelets: 106 10*3/uL — ABNORMAL LOW (ref 150–400)
RBC: 3.36 MIL/uL — ABNORMAL LOW (ref 4.22–5.81)
RDW: 18.5 % — ABNORMAL HIGH (ref 11.5–15.5)
WBC: 9.3 10*3/uL (ref 4.0–10.5)
nRBC: 0 % (ref 0.0–0.2)

## 2023-04-15 LAB — BASIC METABOLIC PANEL
Anion gap: 6 (ref 5–15)
BUN: 10 mg/dL (ref 6–20)
CO2: 28 mmol/L (ref 22–32)
Calcium: 8 mg/dL — ABNORMAL LOW (ref 8.9–10.3)
Chloride: 110 mmol/L (ref 98–111)
Creatinine, Ser: 0.68 mg/dL (ref 0.61–1.24)
GFR, Estimated: >60 mL/min (ref >60)
Glucose, Bld: 158 mg/dL — ABNORMAL HIGH (ref 70–99)
Potassium: 3.1 mmol/L — ABNORMAL LOW (ref 3.5–5.1)
Sodium: 144 mmol/L (ref 135–145)

## 2023-04-15 LAB — CULTURE, RESPIRATORY W GRAM STAIN: Culture: NORMAL

## 2023-04-15 LAB — MAGNESIUM: Magnesium: 1.8 mg/dL (ref 1.7–2.4)

## 2023-04-15 LAB — PHOSPHORUS
Phosphorus: 2.3 mg/dL — ABNORMAL LOW (ref 2.5–4.6)
Phosphorus: 1.8 mg/dL — ABNORMAL LOW (ref 2.5–4.6)

## 2023-04-15 MED ORDER — ENOXAPARIN SODIUM 40 MG/0.4ML IJ SOSY
40.0000 mg | PREFILLED_SYRINGE | Freq: Every day | INTRAMUSCULAR | Status: DC
  Administered 2023-04-15 – 2023-04-28 (×14): 40 mg via SUBCUTANEOUS
  Filled 2023-04-15 (×14): qty 0.4

## 2023-04-15 MED ORDER — POTASSIUM PHOSPHATES 15 MMOLE/5ML IV SOLN
30.0000 mmol | Freq: Once | INTRAVENOUS | Status: AC
  Administered 2023-04-15: 30 mmol via INTRAVENOUS
  Filled 2023-04-15: qty 10

## 2023-04-15 MED ORDER — POTASSIUM CHLORIDE 20 MEQ PO PACK
40.0000 meq | PACK | Freq: Once | ORAL | Status: AC
  Administered 2023-04-15: 40 meq
  Filled 2023-04-15: qty 2

## 2023-04-15 MED ORDER — GERHARDT'S BUTT CREAM
TOPICAL_CREAM | Freq: Two times a day (BID) | CUTANEOUS | Status: DC
  Administered 2023-04-17 – 2023-04-18 (×2): 1 via TOPICAL
  Filled 2023-04-15 (×2): qty 1

## 2023-04-15 MED ORDER — ORAL CARE MOUTH RINSE: 15.0000 mL | OROMUCOSAL | Status: DC | PRN

## 2023-04-15 MED ORDER — MAGNESIUM SULFATE 2 GM/50ML IV SOLN
2.0000 g | Freq: Once | INTRAVENOUS | Status: AC
  Administered 2023-04-15: 2 g via INTRAVENOUS
  Filled 2023-04-15: qty 50

## 2023-04-15 NOTE — Procedures (Signed)
Extubation Procedure Note  Patient Details:   Name: Ethan Wells DOB: 06-Apr-1971 MRN: [id number]   Airway Documentation:    Vent end date: 04/15/23 Vent end time: 1442   Evaluation  O2 sats: stable throughout Complications: No apparent complications Patient did tolerate procedure well. Bilateral Breath Sounds: Rhonchi, Diminished   Yes - whisper  Positive cuff leak noted. Patient placed on [place] 4L with humidity, no stridor noted. Patient not following commands at this time to use the incentive spirometer.  [name] 04/15/2023, 2:47 PM

## 2023-04-15 NOTE — Progress Notes (Signed)
A-line not reading correctly, rt tried to save a-line but a-line was already out. A-line pulled, pressure held until bleeding stopped, pressure bandage applied.

## 2023-04-15 NOTE — Progress Notes (Addendum)
NAME:  Ethan Wells, MRN:  [id number], DOB:  [birth year], LOS: 8 ADMISSION DATE:  04/07/2023, CONSULTATION DATE:  04/07/2023 REFERRING MD:  Dr. Arnoldo Morale, CHIEF COMPLAINT:  Upper GI bleed    History of Present Illness:  Ethan Wells is a 52 yo male with a PMH significant for recurrent GI bleed with known variceal bleed, ischemic cardiomyopathy, EtOH abuse, polysubstance abuse, HTN, HLD, depression, and anxiety who presented to [facility] ED 4/22 for acute GI bleed. Of note patient was seen within this health sytem 1 month ago for significant GI bleed requiring banding per GI with TIPS procedure recommended but patient declined. Patient reports last drink and cocaine use was day prior to admission.  On ED arrival patient was seen hypotensive and mildly tachycardic. Lab work significant for K 2.3, glucoses 140, Mg 1.2, alkaline phos 153, albumin 2.3, AST 44, total protein 5.6, total bili 1.6, lactic 5.6, hgb 7.1, and hct 23.2. Given active bleed with compromised hemodynamics, PCCM consulted for further management and admission.   Pertinent  Medical History  Recurrent GI bleed with known variceal bleed, ischemic cardiomyopathy, EtOH abuse, polysubstance abuse, HTN, HLD, depression, and anxiety   Significant Hospital Events: Including procedures, antibiotic start and stop dates in addition to other pertinent events   4/22 admitted with acute GI bleed  4/23 EGD >> X esophagus, grade 2 varices, banded x 2, portal gastropathy 4/23 intubated for flash pulm edema , started on Lasix and proned 4/25 extubated 4/27 TIPS, re intubated for procedure. Did not meet extubation criteria and was returned to ICU on vent.  4/29 remains intubated, mental status precluding extubation  Interim History / Subjective:  A-line came out overnight  Minimal vent settings  Propofol, levo weaned off  2 stools charted on a lot of lactulose  Looks more alert today  Objective   Blood pressure (!) 100/51, pulse 74,  temperature 98.1 F (36.7 C), temperature source Axillary, resp. rate 15, height 5\' 5"  (1.651 m), weight 87.4 kg, SpO2 94 %.    Vent Mode: PRVC FiO2 (%):  [40 %] 40 % Set Rate:  [16 bmp] 16 bmp Vt Set:  [490 mL] 490 mL PEEP:  [5 cmH20] 5 cmH20 Pressure Support:  [5 cmH20] 5 cmH20 Plateau Pressure:  [15 cmH20-19 cmH20] 19 cmH20   Intake/Output Summary (Last 24 hours) at 04/15/2023 0813 Last data filed at 04/15/2023 0600 Gross per 24 hour  Intake 1661.67 ml  Output 4825 ml  Net -3163.33 ml   Filed Weights   04/14/23 0021 04/14/23 0105 04/15/23 0331  Weight: 92.3 kg 92.3 kg 87.4 kg   General:  critically ill appearing M intubated and sedated  HEENT: MMM Neuro: follows commands with encouragement CV:rrr, no m/r/g PULM:  coarse bl, no rhonchi or wheezing, secretions in ballard GI: soft, mildly distended, tips surgical site bandaged without bleeding or drainage Extremities: warm/dry, trace edema  Skin: no rashes or lesions    Labs/imaging reviewed   Assessment & Plan:   Acute respiratory failure with hypoxia Possible transfused ARDS  extubated 4/25, but reintubated 4/27 for TIPS. Returned from IR intubated.  -additional lasix 80mg  bid -Maintain full vent support with SAT/SBT as tolerated, will consider extubation after SBT -titrate Vent setting to maintain SpO2 greater than or equal to 90%. -HOB elevated 30 degrees. -Plateau pressures less than 30 cm H20.  -Follow chest x-ray, ABG prn.   -Bronchial hygiene and RT/bronchodilator protocol.  Hypokalemia - correct  Hemorrhagic shock Variceal bleed  Thrombocytopenia, mild Post  banding, TIPS. Remains on low dose levophed - con't PPI Q12h - no need for BB from EV ppx standpoint - Repeat surveillance EGD in 3 weeks, TIPS duplex eventually - transfuse for Hb <7 or hemodynamically significant bleeding  Hx cirrhosis 2/2 EtOH Portal gastropathy S/p TIPS 4/27 -completed empiric ceftriaxone -appreciate GI's  management -lactulose 60mg  TID, needs 2-3 BM's per day. Rifaximin when taking PO -PPI  Hx Systolic congestive heart failure (EF 50-55% with WMA 02/04/23), HoTN CAD, HTN, HLD P: -PTA midodrine -lasix -monitor on tele -resume PTA spironolactone -hold PTA torsemide; using IV diuretics currently -minimize IVF  Hx adrenal insufficiency  -continue home hydrocort  Acute metabolic encephalopathy, hepatic encephalopathy Hx polysubstance abuse including EtOH improving - vitamins   Best Practice (right click and "Reselect all SmartList Selections" daily)   Diet/type: NPO, tube feeds DVT prophylaxis: SCD GI prophylaxis: PPI Lines: Central line Foley:  N/A Code Status:  full code Last date of multidisciplinary goals of care discussion: attempted to call listed contact - cousin Sharene Butters and left voicemail  CRITICAL CARE Performed by: Omar Person   Total critical care time: 38 minutes  Critical care time was exclusive of separately billable procedures and treating other patients.  Critical care was necessary to treat or prevent imminent or life-threatening deterioration.  Critical care was time spent personally by me on the following activities: development of treatment plan with patient and/or surrogate as well as nursing, discussions with consultants, evaluation of patient's response to treatment, examination of patient, obtaining history from patient or surrogate, ordering and performing treatments and interventions, ordering and review of laboratory studies, ordering and review of radiographic studies, pulse oximetry and re-evaluation of patient's condition.   [name] Pulmonary/Critical Care  See Amion for pager If no response to pager , please call [phone] until 7pm After 7:00 pm call Elink  [phone]

## 2023-04-15 NOTE — Progress Notes (Signed)
PT Cancellation Note  Patient Details Name: Ethan Wells MRN: [id number] DOB: 29-Jan-1971   Cancelled Treatment:    Reason Eval/Treat Not Completed: Patient declined, no reason specified. Attempted to mobilize and pt resistant and refusing. Will try again tomorrow.   Angelina Ok [facility] 04/15/2023, 5:02 PM Skip Mayer PT Acute [employer] [phone]

## 2023-04-15 NOTE — TOC Progression Note (Signed)
Transition of Care [facility]) - Progression Note    Patient Details  Name: Ethan Wells MRN: [id number] Date of Birth: Sep 17, 1971  Transition of Care [facility]) CM/SW Contact  Tom-Johnson, Hershal Coria, RN Phone Number: 04/15/2023, 3:05 PM  Clinical Narrative:     Patient extubated today, on 4L O2.  Too lethargic to assess at this time. CM will continue to follow as patient progresses with care towards discharge.        Expected Discharge Plan and Services                                               Social Determinants of Health (SDOH) Interventions SDOH Screenings   Food Insecurity: No Food Insecurity (04/07/2023)  Housing: Low Risk  (04/07/2023)  Transportation Needs: No Transportation Needs (04/07/2023)  Utilities: Not At Risk (04/07/2023)  Alcohol Screen: Medium Risk (10/01/2022)  Tobacco Use: High Risk (04/13/2023)    Readmission Risk Interventions    02/24/2023    3:12 PM 01/17/2023    3:38 PM 10/22/2022   10:47 AM  Readmission Risk Prevention Plan  Transportation Screening Complete Complete Complete  PCP or Specialist Appt within 3-5 Days   Complete  Social Work Consult for Recovery Care Planning/Counseling   Complete  Palliative Care Screening   Not Applicable  Medication Review Oceanographer) Complete Complete Complete  SW Recovery Care/Counseling Consult  Not Complete   SW Consult Not Complete Comments  lethargic   Palliative Care Screening Not Applicable Not Applicable   Skilled Nursing Facility Not Applicable Not Applicable

## 2023-04-16 ENCOUNTER — Inpatient Hospital Stay (HOSPITAL_COMMUNITY): Payer: Medicare HMO

## 2023-04-16 LAB — CBC
HCT: 31.3 % — ABNORMAL LOW (ref 39.0–52.0)
Hemoglobin: 9.6 g/dL — ABNORMAL LOW (ref 13.0–17.0)
MCH: 28.9 pg (ref 26.0–34.0)
MCHC: 30.7 g/dL (ref 30.0–36.0)
MCV: 94.3 fL (ref 80.0–100.0)
Platelets: 114 10*3/uL — ABNORMAL LOW (ref 150–400)
RBC: 3.32 MIL/uL — ABNORMAL LOW (ref 4.22–5.81)
RDW: 18.3 % — ABNORMAL HIGH (ref 11.5–15.5)
WBC: 7.8 10*3/uL (ref 4.0–10.5)
nRBC: 0 % (ref 0.0–0.2)

## 2023-04-16 LAB — PHOSPHORUS
Phosphorus: 1.5 mg/dL — ABNORMAL LOW (ref 2.5–4.6)
Phosphorus: 3.4 mg/dL (ref 2.5–4.6)

## 2023-04-16 LAB — MAGNESIUM
Magnesium: 2.1 mg/dL (ref 1.7–2.4)
Magnesium: 2 mg/dL (ref 1.7–2.4)

## 2023-04-16 LAB — BASIC METABOLIC PANEL
Anion gap: 7 (ref 5–15)
BUN: 7 mg/dL (ref 6–20)
CO2: 26 mmol/L (ref 22–32)
Calcium: 8.6 mg/dL — ABNORMAL LOW (ref 8.9–10.3)
Chloride: 117 mmol/L — ABNORMAL HIGH (ref 98–111)
Creatinine, Ser: 0.61 mg/dL (ref 0.61–1.24)
GFR, Estimated: >60 mL/min (ref >60)
Glucose, Bld: 175 mg/dL — ABNORMAL HIGH (ref 70–99)
Potassium: 2.8 mmol/L — ABNORMAL LOW (ref 3.5–5.1)
Sodium: 150 mmol/L — ABNORMAL HIGH (ref 135–145)

## 2023-04-16 LAB — GLUCOSE, CAPILLARY
Glucose-Capillary: 166 mg/dL — ABNORMAL HIGH (ref 70–99)
Glucose-Capillary: 143 mg/dL — ABNORMAL HIGH (ref 70–99)
Glucose-Capillary: 113 mg/dL — ABNORMAL HIGH (ref 70–99)
Glucose-Capillary: 200 mg/dL — ABNORMAL HIGH (ref 70–99)
Glucose-Capillary: 111 mg/dL — ABNORMAL HIGH (ref 70–99)

## 2023-04-16 LAB — POTASSIUM: Potassium: 3 mmol/L — ABNORMAL LOW (ref 3.5–5.1)

## 2023-04-16 MED ORDER — QUETIAPINE FUMARATE 50 MG PO TABS
50.0000 mg | ORAL_TABLET | Freq: Every day | ORAL | Status: DC
  Administered 2023-04-16 – 2023-04-17 (×2): 50 mg
  Filled 2023-04-16 (×2): qty 1

## 2023-04-16 MED ORDER — FREE WATER
200.0000 mL | Status: DC
  Administered 2023-04-16 – 2023-04-21 (×30): 200 mL

## 2023-04-16 MED ORDER — POTASSIUM CHLORIDE 20 MEQ PO PACK
20.0000 meq | PACK | Freq: Two times a day (BID) | ORAL | Status: DC
  Administered 2023-04-16 – 2023-04-25 (×18): 20 meq
  Filled 2023-04-16 (×19): qty 1

## 2023-04-16 MED ORDER — SODIUM CHLORIDE 0.9% FLUSH
10.0000 mL | INTRAVENOUS | Status: DC | PRN
  Administered 2023-04-21 – 2023-04-27 (×2): 10 mL

## 2023-04-16 MED ORDER — LACTULOSE 10 GM/15ML PO SOLN
10.0000 g | Freq: Three times a day (TID) | ORAL | Status: DC
  Administered 2023-04-16 – 2023-04-20 (×11): 10 g
  Filled 2023-04-16 (×6): qty 30
  Filled 2023-04-16: qty 15
  Filled 2023-04-16: qty 30
  Filled 2023-04-16: qty 15
  Filled 2023-04-16 (×5): qty 30

## 2023-04-16 MED ORDER — SODIUM CHLORIDE 0.9% FLUSH
10.0000 mL | Freq: Two times a day (BID) | INTRAVENOUS | Status: DC
  Administered 2023-04-16 – 2023-04-27 (×22): 10 mL

## 2023-04-16 MED ORDER — MIDODRINE HCL 5 MG PO TABS: 5.0000 mg | ORAL_TABLET | Freq: Three times a day (TID) | ORAL | Status: DC

## 2023-04-16 MED ORDER — POTASSIUM PHOSPHATES 15 MMOLE/5ML IV SOLN
30.0000 mmol | Freq: Once | INTRAVENOUS | Status: AC
  Administered 2023-04-16: 30 mmol via INTRAVENOUS
  Filled 2023-04-16: qty 10

## 2023-04-16 MED ORDER — HALOPERIDOL LACTATE 5 MG/ML IJ SOLN
2.0000 mg | Freq: Four times a day (QID) | INTRAMUSCULAR | Status: AC | PRN
  Administered 2023-04-17 – 2023-04-20 (×3): 2 mg via INTRAVENOUS
  Filled 2023-04-16 (×4): qty 1

## 2023-04-16 MED ORDER — FUROSEMIDE 10 MG/ML IJ SOLN
40.0000 mg | Freq: Once | INTRAMUSCULAR | Status: AC
  Administered 2023-04-16: 40 mg via INTRAVENOUS
  Filled 2023-04-16: qty 4

## 2023-04-16 MED ORDER — MIDODRINE HCL 5 MG PO TABS
5.0000 mg | ORAL_TABLET | Freq: Three times a day (TID) | ORAL | Status: DC
  Administered 2023-04-16 – 2023-04-25 (×26): 5 mg
  Filled 2023-04-16 (×30): qty 1

## 2023-04-16 NOTE — Progress Notes (Signed)
Nutrition Follow-up  DOCUMENTATION CODES:   Not applicable  INTERVENTION:   Continue tube feeds via Cortrak: - Vital 1.5 @ 55 ml/hr (1320 ml/day) - PROSource TF20 60 ml daily - Free water per MD, currently 200 ml q 4 hours  Tube feeding regimen provides 2060 kcal, 109 grams of protein, and 1008 ml of H2O.   Total free water with flushes: 2208 ml  - Pt appears to be actively refeeding based on electrolyte labs. Recheck magnesium, phosphorus, and potassium today at 1700. MD to replete as needed.  - Continue MVI with minerals, folic acid, thiamine  NUTRITION DIAGNOSIS:   Inadequate oral intake related to inability to eat as evidenced by NPO status.  Ongoing, being addressed via TF  GOAL:   Patient will meet greater than or equal to 90% of their needs  Met via TF  MONITOR:   Diet advancement, Labs, Weight trends, TF tolerance, I & O's  REASON FOR ASSESSMENT:   Ventilator    ASSESSMENT:   52 y.o. male presented to [facility] ED with acute GI bleed. Pt recently admitted for GI bleed one month prior. PMH includes HTN, CAD, EtOH abuse, polysubstance abuse, CHD, alcohol cirrhosis w/ esophageal varices, and HLD.  04/22 - admitted 04/23 - intubated 04/25 - extubated, diet advanced to clear liquids 04/26 - NPO s/p TIPS, remained intubated post-procedure 04/30 - extubated, NG tube placed 05/01 - pt pulled NG tube, Cortrak placed (tip at pylorus or proximal duodenum), SLP recommending NPO  Discussed pt with RN and during ICU rounds. Pt pulled NG tube out this AM. Cortrak was placed with tip at pylorus per x-ray. SLP evaluated pt and recommends continuing NPO status.  Weight down 4 kg total; pt is net negative 7.8 L since admission.  Pt appears to be actively refeeding; electrolytes are being aggressively supplemented. Recheck phosphorus, magnesium, and potassium at 1700 today.  Admit weight: 89.4 kg Current weight: 85.1 kg  Current TF: Vital 1.5 @ 55 ml/hr, PROSource TF20 60  ml daily, free water flushes 200 ml q 4 hours  Medications reviewed and include: folic acid, SSI q 4 hours, lactulose 10 grams TID per tube, MVI with minerals, IV protonix, klor-con 20 mEq BID, spironolactone, thiamine, precedex drip, IV potassium phosphate 30 mmol x 1  Labs reviewed: sodium 150, potassium 2.8, chloride 117, phosphorus 1.5, hemoglobin 9.6, platelets 114 CBG's: 113-166 x 24 hours  UOP: 850 ml x 24 hours Stool: 2485 ml x 24 hours I/O's: -7.8 L since admit  Diet Order:   Diet Order             Diet NPO time specified Except for: Sips with Meds  Diet effective now                   EDUCATION NEEDS:   Not appropriate for education at this time  Skin:  Skin Assessment: Reviewed RN Assessment (scattered closed incisions)  Last BM:  04/16/23 type 7  Height:   Ht Readings from Last 1 Encounters:  04/13/23 5\' 5"  (1.651 m)    Weight:   Wt Readings from Last 1 Encounters:  04/16/23 85.1 kg    Ideal Body Weight:  61.8 kg  BMI:  Body mass index is 31.22 kg/m.  Estimated Nutritional Needs:   Kcal:  2000-2200  Protein:  100-120 grams  Fluid:  >/= 2 L    Mertie Clause, MS, RD, LDN Inpatient Clinical Dietitian Please see AMiON for contact information.

## 2023-04-16 NOTE — Evaluation (Signed)
Clinical/Bedside Swallow Evaluation Patient Details  Name: Ethan Wells MRN: [id number] Date of Birth: 12-Sep-1971  Today's Date: 04/16/2023 Time: SLP Start Time (ACUTE ONLY): 0900 SLP Stop Time (ACUTE ONLY): 0915 SLP Time Calculation (min) (ACUTE ONLY): 15 min  Past Medical History:  Past Medical History:  Diagnosis Date   CAD (coronary artery disease)    a. 01/2011 Anterior STEMI/Cath/PCI: LM nl, LAD 100d (3.5x63mm Vision BMS placed), LCX 51m, RI 50, RCA min irregs, EF 40% w/ apical, inferoapical HK; b. 10/2022 NSTSEMI/Cath: LM nl, LAD 5p/m, RI 85, LCX 137m, OM1 mild dzs, OM3 100 fills via L->L collats from dLAD, RCA 80p, 151m, RPDA fills via collats from LAD. EF 45-50%-->Med Rx.   Cardiac arrest - ventricular fibrillation    a. In setting of STEMI 01/2011   Chronic pain    Cocaine abuse (HCC)    Depression with anxiety    ETOH abuse    a. 6-12 beers/day   GERD (gastroesophageal reflux disease)    GI bleed    Hemorrhoids    Hyperlipidemia    Hypertension    Ischemic cardiomyopathy    a. 06/2011 Echo: EF 45-50%, No rwma; b. 10/2022 Echo: EF 55-60%, no rwma, nl RV fxn.   Marijuana abuse    Migraines    Tobacco abuse    a. 1/2 ppd x 26 yrs   Past Surgical History:  Past Surgical History:  Procedure Laterality Date   COLONOSCOPY  08/09/2021   Procedure: COLONOSCOPY;  Surgeon: Midge Minium, MD;  Location: [facility] ENDOSCOPY;  Service: Endoscopy;;   CORONARY ANGIOPLASTY WITH STENT PLACEMENT     ESOPHAGEAL BANDING  04/08/2023   Procedure: ESOPHAGEAL BANDING;  Surgeon: Tressia Danas, MD;  Location: [facility] ENDOSCOPY;  Service: Gastroenterology;;   ESOPHAGOGASTRODUODENOSCOPY N/A 05/30/2022   Procedure: ESOPHAGOGASTRODUODENOSCOPY (EGD);  Surgeon: Toney Reil, MD;  Location: [facility] ENDOSCOPY;  Service: Gastroenterology;  Laterality: N/A;   ESOPHAGOGASTRODUODENOSCOPY (EGD) WITH PROPOFOL N/A 03/01/2021   Procedure: ESOPHAGOGASTRODUODENOSCOPY (EGD) WITH PROPOFOL;  Surgeon: Toney Reil, MD;  Location: [facility] SURGERY [facility];  Service: Endoscopy;  Laterality: N/A;   ESOPHAGOGASTRODUODENOSCOPY (EGD) WITH PROPOFOL N/A 08/09/2021   Procedure: ESOPHAGOGASTRODUODENOSCOPY (EGD) WITH PROPOFOL;  Surgeon: Midge Minium, MD;  Location: [facility] ENDOSCOPY;  Service: Endoscopy;  Laterality: N/A;   ESOPHAGOGASTRODUODENOSCOPY (EGD) WITH PROPOFOL N/A 03/01/2023   Procedure: ESOPHAGOGASTRODUODENOSCOPY (EGD) WITH PROPOFOL;  Surgeon: Jaynie Collins, DO;  Location: [facility] ENDOSCOPY;  Service: Gastroenterology;  Laterality: N/A;   ESOPHAGOGASTRODUODENOSCOPY (EGD) WITH PROPOFOL N/A 04/08/2023   Procedure: ESOPHAGOGASTRODUODENOSCOPY (EGD) WITH PROPOFOL;  Surgeon: Tressia Danas, MD;  Location: [facility] ENDOSCOPY;  Service: Gastroenterology;  Laterality: N/A;   EVALUATION UNDER ANESTHESIA WITH HEMORRHOIDECTOMY N/A 10/31/2022   Procedure: EXAM UNDER ANESTHESIA WITH SUTURE LIGATION OF TWO BLEEDING PEDICLES;  Surgeon: Henrene Dodge, MD;  Location: [facility] ORS;  Service: General;  Laterality: N/A;   FLEXIBLE SIGMOIDOSCOPY N/A 10/02/2022   Procedure: FLEXIBLE SIGMOIDOSCOPY;  Surgeon: Meryl Dare, MD;  Location: [facility] ENDOSCOPY;  Service: Gastroenterology;  Laterality: N/A;   IR TIPS  04/11/2023   IR US GUIDE VASC ACCESS RIGHT  04/11/2023   IR US GUIDE VASC ACCESS RIGHT  04/11/2023   IR US GUIDE VASC ACCESS RIGHT  04/11/2023   LEFT HEART CATH AND CORONARY ANGIOGRAPHY N/A 11/04/2022   Procedure: LEFT HEART CATH AND CORONARY ANGIOGRAPHY;  Surgeon: Iran Ouch, MD;  Location: [facility] INVASIVE CV LAB;  Service: Cardiovascular;  Laterality: N/A;   RADIOLOGY WITH ANESTHESIA N/A 04/11/2023   Procedure: IR WITH ANESTHESIA;  Surgeon: Oley Balm, MD;  Location: [facility] OR;  Service: Radiology;  Laterality: N/A;   VISCERAL ANGIOGRAPHY N/A 10/24/2022   Procedure: VISCERAL ANGIOGRAPHY;  Surgeon: Annice Needy, MD;  Location: [facility] INVASIVE CV LAB;  Service: Cardiovascular;  Laterality: N/A;   HPI:  Patient is a 52 y.o. male  with PMH: recurrent GI bleed with known variceal bleed, ischemic cardiomyopathy, ETOH abuse, polysubstance abuse, HTN, HLD, depression and anxiety. He presented to [facility] ED on 04/07/23 with acute GI bleed. He was admitted previous month for GI bleed and TIPS procedure recommended at that time but patient declined it. In ED, patient was hypotensive, mildly tachycardic. EGD completed 4/23. He was intubated 4/23 for flash pulmonary edema and extubated 4/25. He was reintubated on 4/27 for TIPS procedure but unable to be extubated following procedure. On 4/30 NG was placed and patient was extubated.    Assessment / Plan / Recommendation  Clinical Impression  Patient presents with clinical s/s iof dysphagia as per this bedside swallow evaluation with suspected cause being prolonged (total of 7 days) intubation as well as current state of lethargy. SLP performed oral care and removed trace amount of residuals in oral cavity but when Yaunkauer suction was placed further back in oral cavity, SLP suctioned out mild amount of thick secretions. Patient was able to cough when cued, with cough sounding congested. When cued to perform dry swallow as well as swallow after very small amount of water provided on toothette sponge, patient was able to do so, howeover appeared with decreased hyolaryngeal movement. SLP recommending continue NPO status and will follow for PO readiness. SLP also recommends considering smaller bore NG (Cortrak). SLP Visit Diagnosis: Dysphagia, unspecified (R13.10)    Aspiration Risk  Severe aspiration risk;Risk for inadequate nutrition/hydration    Diet Recommendation NPO        Other  Recommendations Oral Care Recommendations: Oral care QID;Staff/trained caregiver to provide oral care    Recommendations for follow up therapy are one component of a multi-disciplinary discharge planning process, led by the attending physician.  Recommendations may be updated based on patient status, additional  functional criteria and insurance authorization.  Follow up Recommendations Follow physician's recommendations for discharge plan and follow up therapies      Assistance Recommended at Discharge    Functional Status Assessment Patient has had a recent decline in their functional status and demonstrates the ability to make significant improvements in function in a reasonable and predictable amount of time.  Frequency and Duration min 2x/week  2 weeks       Prognosis Prognosis for improved oropharyngeal function: Good      Swallow Study   General Date of Onset: 04/07/23 HPI: Patient is a 52 y.o. male with PMH: recurrent GI bleed with known variceal bleed, ischemic cardiomyopathy, ETOH abuse, polysubstance abuse, HTN, HLD, depression and anxiety. He presented to [facility] ED on 04/07/23 with acute GI bleed. He was admitted previous month for GI bleed and TIPS procedure recommended at that time but patient declined it. In ED, patient was hypotensive, mildly tachycardic. EGD completed 4/23. He was intubated 4/23 for flash pulmonary edema and extubated 4/25. He was reintubated on 4/27 for TIPS procedure but unable to be extubated following procedure. On 4/30 NG was placed and patient was extubated. Type of Study: Bedside Swallow Evaluation Previous Swallow Assessment: none found Diet Prior to this Study: NPO Temperature Spikes Noted: No Respiratory Status: Nasal cannula History of Recent Intubation: Yes Total duration of intubation (days): 7 days  Date extubated: 04/15/23 Behavior/Cognition: Alert;Cooperative;Requires cueing Oral Cavity Assessment: Dried secretions;Dry Oral Care Completed by SLP: Yes Oral Cavity - Dentition: Poor condition;Missing dentition Self-Feeding Abilities: Total assist Patient Positioning: Upright in bed Baseline Vocal Quality: Hoarse;Other (comment) (mod-severely dysphonic) Volitional Cough: Congested Volitional Swallow: Able to elicit    Oral/Motor/Sensory Function  Overall Oral Motor/Sensory Function: Other (comment) (patient with some difficulty following commands but no asymmetry or weakness observed)   Ice Chips Ice chips: Not tested   Thin Liquid Thin Liquid: Impaired Pharyngeal  Phase Impairments: Suspected delayed Swallow;Decreased hyoid-laryngeal movement    Nectar Thick Nectar Thick Liquid: Not tested   Honey Thick Honey Thick Liquid: Not tested   Puree Puree: Not tested   Solid     Solid: Not tested     Angela Nevin, MA, CCC-SLP Speech Therapy

## 2023-04-16 NOTE — TOC Progression Note (Signed)
Transition of Care [facility]) - Progression Note    Patient Details  Name: Ethan Wells MRN: [id number] Date of Birth: [birth year]  Transition of Care [facility]) CM/SW Contact  Tom-Johnson, Hershal Coria, RN Phone Number: 04/16/2023, 1:56 PM  Clinical Narrative:     CM went to patient's room to assess for disposition. Patient soundly asleep and did not respond when called. On bilateral soft wrist restraint. On 4L O2, Precedex.  CM will continue to follow and will assess when patient is more awake and alert.         Expected Discharge Plan and Services                                               Social Determinants of Health (SDOH) Interventions SDOH Screenings   Food Insecurity: No Food Insecurity (04/07/2023)  Housing: Low Risk  (04/07/2023)  Transportation Needs: No Transportation Needs (04/07/2023)  Utilities: Not At Risk (04/07/2023)  Alcohol Screen: Medium Risk (10/01/2022)  Tobacco Use: High Risk (04/13/2023)    Readmission Risk Interventions    02/24/2023    3:12 PM 01/17/2023    3:38 PM 10/22/2022   10:47 AM  Readmission Risk Prevention Plan  Transportation Screening Complete Complete Complete  PCP or Specialist Appt within 3-5 Days   Complete  Social Work Consult for Recovery Care Planning/Counseling   Complete  Palliative Care Screening   Not Applicable  Medication Review Oceanographer) Complete Complete Complete  SW Recovery Care/Counseling Consult  Not Complete   SW Consult Not Complete Comments  lethargic   Palliative Care Screening Not Applicable Not Applicable   Skilled Nursing Facility Not Applicable Not Applicable

## 2023-04-16 NOTE — Progress Notes (Signed)
eLink Physician-Brief Progress Note Patient Name: Ethan Wells DOB: [birth year] MRN: [id number]   Date of Service  04/16/2023  HPI/Events of Note  RN notified us that patient has some belly pain. Is confused so not able to explain clearly. RN asking him questions on phone, currently denies pain. When I asked RN to palpate the belly, she says he is not tender. Is on tube feeds. No belly distension per RN. Also notes he was coughing earlier and coughed up a mucus plug that had some blood . No frank hemoptysis. Has not recurred but does have cough with mucus per RN. No change in o2 needs is on 2 liter. Hemodynamics stable. Confusion unchanged.   eICU Interventions  CXR and KUB ordered      Intervention Category Minor Interventions: Clinical assessment - ordering diagnostic tests  Oretha Milch 04/16/2023, 11:26 PM  Addendum at 4:15 am - Belly pain again. Was seen by CCM team earlier. Nothing acute on exam. Difficult to assess from confusion. Check labs cbc cmp amylase lipase. Ordered US abdomen as well Hold tube feeds

## 2023-04-16 NOTE — Procedures (Signed)
Cortrak  Person Inserting Tube:  Netanya Yazdani D, RD Tube Type:  Cortrak - 43 inches Tube Size:  10 Tube Location:  Left nare Secured by: Bridle Technique Used to Measure Tube Placement:  Marking at nare/corner of mouth Cortrak Secured At:  66 cm Procedure Comments:  Cortrak Tube Team Note:  Consult received to place a Cortrak feeding tube.   X-ray is required, abdominal x-ray has been ordered by the Cortrak team. Please confirm tube placement before using the Cortrak tube.   If the tube becomes dislodged please keep the tube and contact the Cortrak team at [email or web address] for replacement.  If after hours and replacement cannot be delayed, place a NG tube and confirm placement with an abdominal x-ray.    Deeanne Deininger, RD, LDN Clinical Dietitian RD pager # available in AMION  After hours/weekend pager # available in AMION    

## 2023-04-16 NOTE — Progress Notes (Signed)
NAME:  Ethan Wells, MRN:  [id number], DOB:  [birth year], LOS: 9 ADMISSION DATE:  04/07/2023, CONSULTATION DATE:  04/07/2023 REFERRING MD:  Dr. Arnoldo Morale, CHIEF COMPLAINT:  Upper GI bleed    History of Present Illness:  Ethan Wells is a 52 yo male with a PMH significant for recurrent GI bleed with known variceal bleed, ischemic cardiomyopathy, EtOH abuse, polysubstance abuse, HTN, HLD, depression, and anxiety who presented to [facility] ED 4/22 for acute GI bleed. Of note patient was seen within this health sytem 1 month ago for significant GI bleed requiring banding per GI with TIPS procedure recommended but patient declined. Patient reports last drink and cocaine use was day prior to admission.  On ED arrival patient was seen hypotensive and mildly tachycardic. Lab work significant for K 2.3, glucoses 140, Mg 1.2, alkaline phos 153, albumin 2.3, AST 44, total protein 5.6, total bili 1.6, lactic 5.6, hgb 7.1, and hct 23.2. Given active bleed with compromised hemodynamics, PCCM consulted for further management and admission.   Pertinent  Medical History  Recurrent GI bleed with known variceal bleed, ischemic cardiomyopathy, EtOH abuse, polysubstance abuse, HTN, HLD, depression, and anxiety   Significant Hospital Events: Including procedures, antibiotic start and stop dates in addition to other pertinent events   4/22 admitted with acute GI bleed  4/23 EGD >> X esophagus, grade 2 varices, banded x 2, portal gastropathy 4/23 intubated for flash pulm edema , started on Lasix and proned 4/25 extubated 4/27 TIPS, re intubated for procedure. Did not meet extubation criteria and was returned to ICU on vent.  4/29 remains intubated, mental status precluding extubation 4/30 NGT placed, extubated  Interim History / Subjective:  Extubated. Intermittent agitation still on precedex.   Objective   Blood pressure (!) 142/74, pulse 71, temperature 98.3 F (36.8 C), temperature source Oral, resp. rate  18, height 5\' 5"  (1.651 m), weight 85.1 kg, SpO2 95 %.    Vent Mode: PSV;CPAP FiO2 (%):  [40 %] (P) 40 % PEEP:  [5 cmH20] 5 cmH20 Pressure Support:  [5 cmH20] 5 cmH20   Intake/Output Summary (Last 24 hours) at 04/16/2023 0737 Last data filed at 04/16/2023 0606 Gross per 24 hour  Intake 2442.12 ml  Output 3335 ml  Net -892.88 ml   Filed Weights   04/14/23 0105 04/15/23 0331 04/16/23 0429  Weight: 92.3 kg 87.4 kg 85.1 kg   General:  chronically ill appearing HEENT: MMM, NGT in place Neuro: not following commands but appears to be volitional CV:rrr, no m/r/g PULM:  diminished bl, normal wob GI: soft, mildly distended, tips surgical site bandaged without bleeding or drainage Extremities: warm/dry,  trace edema  Skin: no rashes or lesions    Labs/imaging reviewed  Resolved Issues: Hemorrhagic shock  Assessment & Plan:   Acute respiratory failure with hypoxia, resolving Possible transfused ARDS. Extubated 4/25, but reintubated 4/27 for TIPS. Returned from IR intubated, extubated 4/30.  -PT/mobilize -pulmonary hygiene  Hypernatremia Hypokalemia - giving potassium - start FWF, decreasing lactulose as below  Variceal bleed  Thrombocytopenia, mild Post banding, TIPS - con't PPI Q12h - no need for BB from EV ppx standpoint - Repeat surveillance EGD in 3 weeks, TIPS duplex ordered by GI - transfuse for Hb <7 or hemodynamically significant bleeding  Hx cirrhosis 2/2 EtOH Portal gastropathy S/p TIPS 4/27 -completed empiric ceftriaxone -appreciate GI's management -decrease to lactulose 60mg  TID, needs 2-3 BM's per day. Rifaximin when taking PO -PPI  Hx Systolic congestive heart failure (EF 50-55% with  WMA 02/04/23), HoTN CAD, HTN, HLD P: -PTA midodrine -lasix -monitor on tele -resume PTA spironolactone -hold PTA torsemide; using IV diuretics currently -minimize IVF  Hx adrenal insufficiency  -continue home hydrocort  Acute metabolic encephalopathy, hepatic  encephalopathy Hx polysubstance abuse including EtOH improving - vitamins - home seroquel 25 mg qhs - wean precedex   Best Practice (right click and "Reselect all SmartList Selections" daily)   Diet/type: NPO, tube feeds DVT prophylaxis: SCD GI prophylaxis: PPI Lines: Central line - will remove today Foley:  N/A Code Status:  full code Last date of multidisciplinary goals of care discussion: attempted to call listed contact - cousin Sharene Butters and left voicemail  CRITICAL CARE Performed by: Omar Person   Total critical care time: 34 minutes  Critical care time was exclusive of separately billable procedures and treating other patients.  Critical care was necessary to treat or prevent imminent or life-threatening deterioration.  Critical care was time spent personally by me on the following activities: development of treatment plan with patient and/or surrogate as well as nursing, discussions with consultants, evaluation of patient's response to treatment, examination of patient, obtaining history from patient or surrogate, ordering and performing treatments and interventions, ordering and review of laboratory studies, ordering and review of radiographic studies, pulse oximetry and re-evaluation of patient's condition.   [name] Pulmonary/Critical Care  See Amion for pager If no response to pager , please call [phone] until 7pm After 7:00 pm call Elink  [phone]

## 2023-04-16 NOTE — Progress Notes (Signed)
Physical Therapy Treatment Patient Details Name: Ethan Wells MRN: [id number] DOB: 12-13-1971 Today's Date: 04/16/2023   History of Present Illness YEISON SIPPEL is a 52 yo male presented to [facility] ED 4/22 for acute GI bleed. Of note patient was seen within this health sytem 1 month ago for significant GI bleed requiring banding per GI with TIPS procedure recommended but pt declined. Pt transferred to [facility] due to sepsis resulting in VDRF 4/23-4/25.  Patient reports last drink and cocaine use prior to admit was day prior to admission.   TIPS procedure 4/26  with pt VDRF 4/26-4/29.    PMH significant for recurrent GI bleed with known variceal bleed, ischemic cardiomyopathy, EtOH abuse, polysubstance abuse, HTN, HLD, depression, and anxiety    PT Comments    Pt admitted with above diagnosis. Pt was able to stand to Stedy x 4 to be cleaned. Pt much more confused today and limited due to this. Did not get pt OOB due to safety issues. Nurse aware of pts cognition. Will continue to follow acutely. Updated d/c plan as feel pt will need therapy at d/c and can tolerate 1-2 hours day.  Pt currently with functional limitations due to balance and endurance deficits.  Pt will benefit from acute skilled PT to increase their independence and safety with mobility to allow discharge.      Recommendations for follow up therapy are one component of a multi-disciplinary discharge planning process, led by the attending physician.  Recommendations may be updated based on patient status, additional functional criteria and insurance authorization.  Follow Up Recommendations  Can patient physically be transported by private vehicle: No    Assistance Recommended at Discharge Frequent or constant Supervision/Assistance  Patient can return home with the following A little help with walking and/or transfers;A little help with bathing/dressing/bathroom;Assistance with cooking/housework;Assist for transportation;Help  with stairs or ramp for entrance   Equipment Recommendations  Rollator (4 wheels)    Recommendations for Other Services       Precautions / Restrictions Precautions Precautions: Fall Restrictions Weight Bearing Restrictions: No     Mobility  Bed Mobility Overal bed mobility: Needs Assistance Bed Mobility: Supine to Sit     Supine to sit: Mod assist, +2 for physical assistance     General bed mobility comments: On arrival, noted pt's foley looked like he had pulled it and nurse called by PT and noted that pt had pulled it and his NG tube out.  Pt was restrained and nurse thinks that he somehow pulled foley with foot on the tube and possibly got NG under his arm.  Nurse pulled both tubes prior to PT. Pt needed mod assist to get up to EOB with use of pad and pt impulsive needing assist to slow down. Uses momentum to move.    Transfers Overall transfer level: Needs assistance   Transfers: Sit to/from Stand, Bed to chair/wheelchair/BSC Sit to Stand: Mod assist, +2 physical assistance, From elevated surface           General transfer comment: Pt needs mod assist of 2 for safety as he moves impulsively and needed cues for safety with pt standing to [facility] with mod assist of 2 needing cues for posture as he tends to flex forward causing decr safety. Once pt stood, nurse started to clean pt and pt began urinating.  Incr time to clean up pt and floor and change all linens.  Pt stood  4 x for up to 20 seconds at a a  time. Also sat abruptly needing assist to control descent. Did not leave pt OOB in chair as he is too restless. Left in chair position. Transfer via Lift Equipment: Stedy  Ambulation/Gait                   Stairs             Wheelchair Mobility    Modified Rankin (Stroke Patients Only)       Balance Overall balance assessment: Needs assistance Sitting-balance support: Feet supported, Bilateral upper extremity supported Sitting balance-Leahy Scale:  Poor Sitting balance - Comments: relies intermisttently on UEs to balance sitting eOB   Standing balance support: Bilateral upper extremity supported, During functional activity, Reliant on assistive device for balance Standing balance-Leahy Scale: Poor Standing balance comment: Needed UE and external support                            Cognition Arousal/Alertness: Awake/alert Behavior During Therapy: WFL for tasks assessed/performed Overall Cognitive Status: Within Functional Limits for tasks assessed                                 General Comments: Decr safety and pt impulsive, restless; pt more confused today.        Exercises General Exercises - Lower Extremity Ankle Circles/Pumps: AROM, Both, 10 reps, Supine Long Arc Quad: AROM, Both, 10 reps, Seated    General Comments General comments (skin integrity, edema, etc.): VSS on 4LO2      Pertinent Vitals/Pain Pain Assessment Pain Assessment: Faces Faces Pain Scale: Hurts even more Pain Location: generalized chronic pain Pain Descriptors / Indicators: Aching, Grimacing, Guarding Pain Intervention(s): Limited activity within patient's tolerance, Monitored during session, Repositioned    Home Living                          Prior Function            PT Goals (current goals can now be found in the care plan section) Progress towards PT goals: Progressing toward goals    Frequency    Min 3X/week      PT Plan Discharge plan needs to be updated    Co-evaluation              AM-PAC PT "6 Clicks" Mobility   Outcome Measure  Help needed turning from your back to your side while in a flat bed without using bedrails?: Total Help needed moving from lying on your back to sitting on the side of a flat bed without using bedrails?: Total Help needed moving to and from a bed to a chair (including a wheelchair)?: Total Help needed standing up from a chair using your arms (e.g.,  wheelchair or bedside chair)?: Total Help needed to walk in hospital room?: Total Help needed climbing 3-5 steps with a railing? : Total 6 Click Score: 6    End of Session Equipment Utilized During Treatment: Gait belt Activity Tolerance: Patient limited by fatigue Patient left: with call bell/phone within reach;in bed;with bed alarm set;with restraints reapplied Nurse Communication: Mobility status;Need for lift equipment Antony Salmon) PT Visit Diagnosis: Unsteadiness on feet (R26.81);Muscle weakness (generalized) (M62.81)     Time: 4098-1191 PT Time Calculation (min) (ACUTE ONLY): 30 min  Charges:  $Therapeutic Exercise: 8-22 mins $Therapeutic Activity: 8-22 mins  [facility] M,PT Acute Rehab Services [phone]    Bevelyn Buckles 04/16/2023, 2:13 PM

## 2023-04-17 ENCOUNTER — Inpatient Hospital Stay (HOSPITAL_COMMUNITY): Payer: Medicare HMO

## 2023-04-17 DIAGNOSIS — K7682 Hepatic encephalopathy: Secondary | ICD-10-CM

## 2023-04-17 DIAGNOSIS — D500 Iron deficiency anemia secondary to blood loss (chronic): Secondary | ICD-10-CM

## 2023-04-17 DIAGNOSIS — I8511 Secondary esophageal varices with bleeding: Secondary | ICD-10-CM

## 2023-04-17 LAB — LIPASE, BLOOD: Lipase: 27 U/L (ref 11–51)

## 2023-04-17 LAB — CBC WITH DIFFERENTIAL/PLATELET
Abs Immature Granulocytes: 0.06 10*3/uL (ref 0.00–0.07)
Basophils Absolute: 0 10*3/uL (ref 0.0–0.1)
Basophils Relative: 0 %
Eosinophils Absolute: 0.1 10*3/uL (ref 0.0–0.5)
Eosinophils Relative: 1 %
HCT: 29.1 % — ABNORMAL LOW (ref 39.0–52.0)
Hemoglobin: 9.3 g/dL — ABNORMAL LOW (ref 13.0–17.0)
Immature Granulocytes: 1 %
Lymphocytes Relative: 18 %
Lymphs Abs: 1.9 10*3/uL (ref 0.7–4.0)
MCH: 29.3 pg (ref 26.0–34.0)
MCHC: 32 g/dL (ref 30.0–36.0)
MCV: 91.8 fL (ref 80.0–100.0)
Monocytes Absolute: 1.4 10*3/uL — ABNORMAL HIGH (ref 0.1–1.0)
Monocytes Relative: 13 %
Neutro Abs: 7.1 10*3/uL (ref 1.7–7.7)
Neutrophils Relative %: 67 %
Platelets: 118 10*3/uL — ABNORMAL LOW (ref 150–400)
RBC: 3.17 MIL/uL — ABNORMAL LOW (ref 4.22–5.81)
RDW: 18.5 % — ABNORMAL HIGH (ref 11.5–15.5)
WBC: 10.5 10*3/uL (ref 4.0–10.5)
nRBC: 0 % (ref 0.0–0.2)

## 2023-04-17 LAB — AMYLASE: Amylase: 29 U/L (ref 28–100)

## 2023-04-17 LAB — COMPREHENSIVE METABOLIC PANEL
ALT: 44 U/L (ref 0–44)
AST: 69 U/L — ABNORMAL HIGH (ref 15–41)
Albumin: 2.2 g/dL — ABNORMAL LOW (ref 3.5–5.0)
Alkaline Phosphatase: 181 U/L — ABNORMAL HIGH (ref 38–126)
Anion gap: 12 (ref 5–15)
BUN: 8 mg/dL (ref 6–20)
CO2: 24 mmol/L (ref 22–32)
Calcium: 7.9 mg/dL — ABNORMAL LOW (ref 8.9–10.3)
Chloride: 107 mmol/L (ref 98–111)
Creatinine, Ser: 0.64 mg/dL (ref 0.61–1.24)
GFR, Estimated: >60 mL/min (ref >60)
Glucose, Bld: 133 mg/dL — ABNORMAL HIGH (ref 70–99)
Potassium: 3 mmol/L — ABNORMAL LOW (ref 3.5–5.1)
Sodium: 143 mmol/L (ref 135–145)
Total Bilirubin: 2.6 mg/dL — ABNORMAL HIGH (ref 0.3–1.2)
Total Protein: 5.8 g/dL — ABNORMAL LOW (ref 6.5–8.1)

## 2023-04-17 LAB — GLUCOSE, CAPILLARY
Glucose-Capillary: 123 mg/dL — ABNORMAL HIGH (ref 70–99)
Glucose-Capillary: 136 mg/dL — ABNORMAL HIGH (ref 70–99)
Glucose-Capillary: 156 mg/dL — ABNORMAL HIGH (ref 70–99)
Glucose-Capillary: 166 mg/dL — ABNORMAL HIGH (ref 70–99)
Glucose-Capillary: 92 mg/dL (ref 70–99)
Glucose-Capillary: 94 mg/dL (ref 70–99)
Glucose-Capillary: 98 mg/dL (ref 70–99)

## 2023-04-17 LAB — PHOSPHORUS: Phosphorus: 1.9 mg/dL — ABNORMAL LOW (ref 2.5–4.6)

## 2023-04-17 LAB — MAGNESIUM: Magnesium: 1.7 mg/dL (ref 1.7–2.4)

## 2023-04-17 MED ORDER — HYDROMORPHONE HCL 1 MG/ML IJ SOLN
1.0000 mg | INTRAMUSCULAR | Status: AC | PRN
  Administered 2023-04-17 – 2023-04-18 (×2): 1 mg via INTRAVENOUS
  Filled 2023-04-17 (×2): qty 1

## 2023-04-17 MED ORDER — ALUM & MAG HYDROXIDE-SIMETH 200-200-20 MG/5ML PO SUSP
30.0000 mL | Freq: Once | ORAL | Status: AC
  Administered 2023-04-17: 30 mL
  Filled 2023-04-17: qty 30

## 2023-04-17 MED ORDER — DICYCLOMINE HCL 10 MG PO CAPS: 10.0000 mg | ORAL_CAPSULE | Freq: Once | ORAL | Status: DC

## 2023-04-17 MED ORDER — POTASSIUM PHOSPHATES 15 MMOLE/5ML IV SOLN
30.0000 mmol | Freq: Once | INTRAVENOUS | Status: AC
  Administered 2023-04-17: 30 mmol via INTRAVENOUS
  Filled 2023-04-17: qty 10

## 2023-04-17 MED ORDER — POTASSIUM CHLORIDE 20 MEQ PO PACK
20.0000 meq | PACK | Freq: Three times a day (TID) | ORAL | Status: AC
  Administered 2023-04-17 (×2): 20 meq
  Filled 2023-04-17 (×3): qty 1

## 2023-04-17 NOTE — Progress Notes (Signed)
PCCM INTERVAL PROGRESS NOTE  Called to bedside to evaluate patient with abdominal pain. C/o mild epigastric pain requesting pain medication. He localizes pain to the epigastrium and does seem to have some mild tenderness on deep palpation. CXR, KUB, and EKG are non-acute. Of note, the patient is hallucinating. He believes he is in a restaurant and is referring to his nurse as his waitress, asking for beer. He has been here over a week and should be outside the window for ETOH w/d. At risk for HE s/p tips but he has been receiving lactulose and last ammonia is 20.   Plan: Previously ordered PRN haldol dose being given Maalox x1 per tube If symptoms don't improve or worsen we may need to consider additional imaging [place] vs CT   Joneen Roach, AGACNP-BC [facility]  See Amion for personal pager PCCM on call pager [phone] until 7pm. Please call Elink 7p-7a. [phone]  04/17/2023 1:26 AM

## 2023-04-17 NOTE — Progress Notes (Addendum)
PROGRESS NOTE    Ethan Wells  JXB:[id number] DOB: [birth year] DOA: 04/07/2023 PCP: [name], NP    No chief complaint on file.   Brief Narrative:   Ethan Wells is a 52 yo male with a PMH significant for recurrent GI bleed with known variceal bleed, ischemic cardiomyopathy, EtOH abuse, polysubstance abuse, HTN, HLD, depression, and anxiety who presented to [facility] ED 4/22 for acute GI bleed. Of note patient was seen within this health sytem 1 month ago for significant GI bleed requiring banding per GI with TIPS procedure recommended but patient declined. Patient reports last drink and cocaine use was day prior to admission.   On ED arrival patient was seen hypotensive and mildly tachycardic. Lab work significant for K 2.3, glucoses 140, Mg 1.2, alkaline phos 153, albumin 2.3, AST 44, total protein 5.6, total bili 1.6, lactic 5.6, hgb 7.1, and hct 23.2. Given active bleed with compromised hemodynamics, PCCM consulted for further management and admission.   Significant events during hospital stay  4/22 admitted with acute GI bleed  4/23 EGD >> X esophagus, grade 2 varices, banded x 2, portal gastropathy 4/23 intubated for flash pulm edema , started on Lasix and proned 4/25 extubated 4/27 TIPS, re intubated for procedure. Did not meet extubation criteria and was returned to ICU on vent.  4/29 remains intubated, mental status precluding extubation 4/30 NGT placed, extubated 5/2 [facility]    Assessment & Plan:   Principal Problem:   GI bleed Active Problems:   Anemia due to GI blood loss   Acute hypoxic respiratory failure (HCC)   Secondary esophageal varices with bleeding (HCC)   Esophageal varices with bleeding in diseases classified elsewhere (HCC)   Hepatic encephalopathy (HCC)   Alcoholic cirrhosis (HCC)  Acute respiratory failure with hypoxia, resolving - Possible transfused ARDS. Extubated 4/25, but reintubated  4/27 for TIPS. Returned from IR intubated, extubated 4/30.  -PT/mobilize -pulmonary hygiene -Will encourage to use spirometry and flutter valve.   Hypernatremia Hypophosphatemia Hypokalemia -Replace as needed   Variceal bleed  Thrombocytopenia, mild Post banding, TIPS - con't PPI Q12h - no need for BB from EV ppx standpoint - Repeat surveillance EGD in 3 weeks, TIPS duplex ordered by GI - transfuse for Hb <7 or hemodynamically significant bleeding   Hx cirrhosis 2/2 EtOH Portal gastropathy GI bleed S/p TIPS 4/27 -completed empiric ceftriaxone -appreciate GI's management -decrease to lactulose 60mg  TID, needs 2-3 BM's per day. Rifaximin when taking PO -PPI   Hx Systolic congestive heart failure (EF 50-55% with WMA 02/04/23), HoTN CAD, HTN, HLD P: -PTA midodrine -lasix -monitor on tele -resume PTA spironolactone -hold PTA torsemide; using IV diuretics currently -minimize IVF   Hx adrenal insufficiency  -continue home hydrocort   Acute metabolic encephalopathy, hepatic encephalopathy Hx polysubstance abuse including EtOH improving - vitamins - home seroquel 25 mg qhs - wean precedex      DVT prophylaxis: Lovenox Code Status: Full Family Communication: none at bedside Disposition:   Status is: Inpatient    Consultants:  PCCM GI   Subjective:  Patient with complaints of abdominal pain overnight, but this seems to have resolved this morning.  Objective: Vitals:   04/17/23 0400 04/17/23 0834 04/17/23 0838 04/17/23 1202  BP: (!) 148/76 (!) 90/46  (!) 97/49  Pulse: (!) 102 100 98 95  Resp: 19 20 18 15   Temp: 98.8 F (37.1 C) 98.2 F (36.8 C)  97.9 F (36.6 C)  TempSrc: Oral Oral  Oral  SpO2: 97% 98% 97% 99%  Weight:      Height:        Intake/Output Summary (Last 24 hours) at 04/17/2023 1312 Last data filed at 04/16/2023 1700 Gross per 24 hour  Intake 402.5 ml  Output --  Net 402.5 ml   Filed Weights   04/14/23 0105 04/15/23 0331 04/16/23  0429  Weight: 92.3 kg 87.4 kg 85.1 kg    Examination:  The somnolent, but wakes up, remains confused, chronically ill-appearing, core Trak tube present Symmetrical Chest wall movement, Good air movement bilaterally, CTAB RRR,No Gallops,Rubs or new Murmurs, No Parasternal Heave +ve B.Sounds, Abd Soft, No tenderness, No rebound - guarding or rigidity. No Cyanosis, Clubbing or edema, No new Rash or bruise      Data Reviewed: I have personally reviewed following labs and imaging studies  CBC: Recent Labs  Lab 04/13/23 0344 04/14/23 0332 04/15/23 0453 04/16/23 0431 04/17/23 0935  WBC 18.7* 16.6* 9.3 7.8 10.5  NEUTROABS  --   --   --   --  7.1  HGB 9.7* 9.6* 9.8* 9.6* 9.3*  HCT 29.2* 29.9* 30.8* 31.3* 29.1*  MCV 88.2 90.6 91.7 94.3 91.8  PLT 151 143* 106* 114* 118*    Basic Metabolic Panel: Recent Labs  Lab 04/13/23 0344 04/14/23 0332 04/14/23 1239 04/15/23 0453 04/15/23 1906 04/16/23 0431 04/16/23 1611 04/17/23 0502  NA 142 141  --  144  --  150*  --  143  K 2.8* 3.2* 3.9 3.1*  --  2.8* 3.0* 3.0*  CL 105 107  --  110  --  117*  --  107  CO2 27 28  --  28  --  26  --  24  GLUCOSE 114* 124*  --  158*  --  175*  --  133*  BUN 6 7  --  10  --  7  --  8  CREATININE 0.70 0.74  --  0.68  --  0.61  --  0.64  CALCIUM 7.9* 7.8*  --  8.0*  --  8.6*  --  7.9*  MG 1.9 2.2  --  1.8  --  2.1 2.0 1.7  PHOS 2.5  --   --  2.3* 1.8* 1.5* 3.4 1.9*    GFR: Estimated Creatinine Clearance: 110.8 mL/min (by C-G formula based on SCr of 0.64 mg/dL).  Liver Function Tests: Recent Labs  Lab 04/11/23 1002 04/12/23 0551 04/17/23 0502  AST 35 54* 69*  ALT 17 24 44  ALKPHOS 109 110 181*  BILITOT 2.5* 3.3* 2.6*  PROT 5.5* 5.3* 5.8*  ALBUMIN 2.3* 2.5* 2.2*    CBG: Recent Labs  Lab 04/16/23 2053 04/17/23 0003 04/17/23 0413 04/17/23 0833 04/17/23 1203  GLUCAP 111* 92 136* 98 94     Recent Results (from the past 240 hour(s))  Blood Culture (routine x 2)     Status: None    Collection Time: 04/07/23  1:46 PM   Specimen: BLOOD  Result Value Ref Range Status   Specimen Description BLOOD PORTA CATH  Final   Special Requests   Final    BOTTLES DRAWN AEROBIC AND ANAEROBIC Blood Culture adequate volume   Culture   Final    NO GROWTH 5 DAYS Performed at [facility], [address] [place], [place] [address]    Report Status 04/12/2023 FINAL  Final  Blood Culture (routine x 2)     Status: None   Collection Time: 04/07/23  1:46 PM  Specimen: BLOOD  Result Value Ref Range Status   Specimen Description BLOOD PORTA CATH  Final   Special Requests   Final    BOTTLES DRAWN AEROBIC AND ANAEROBIC Blood Culture adequate volume   Culture   Final    NO GROWTH 5 DAYS Performed at [facility], [address], [place], [place] [address]    Report Status 04/12/2023 FINAL  Final  MRSA Next Gen by PCR, Nasal     Status: None   Collection Time: 04/07/23  7:29 PM   Specimen: Nasal Mucosa; Nasal Swab  Result Value Ref Range Status   MRSA by PCR Next Gen NOT DETECTED NOT DETECTED Final    Comment: (NOTE) The GeneXpert MRSA Assay (FDA approved for NASAL specimens only), is one component of a comprehensive MRSA colonization surveillance program. It is not intended to diagnose MRSA infection nor to guide or monitor treatment for MRSA infections. Test performance is not FDA approved in patients less than 76 years old. Performed at [facility], [address] [place], [place] [address]   Culture, Respiratory w Gram Stain     Status: None   Collection Time: 04/13/23 11:35 AM   Specimen: Tracheal Aspirate; Respiratory  Result Value Ref Range Status   Specimen Description TRACHEAL ASPIRATE  Final   Special Requests NONE  Final   Gram Stain   Final    FEW WBC PRESENT, PREDOMINANTLY PMN NO ORGANISMS SEEN    Culture   Final    RARE Normal respiratory flora-no Staph aureus or Pseudomonas seen Performed at [facility], [address]  [place], [place] [address]    Report Status 04/15/2023 FINAL  Final         Radiology Studies: US Abdomen Complete  Result Date: 04/17/2023 CLINICAL DATA:  Abdominal pain. EXAM: ABDOMEN ULTRASOUND COMPLETE COMPARISON:  February 28, 2023. FINDINGS: Gallbladder: No gallstones or wall thickening visualized. No sonographic Murphy sign noted by sonographer. Mild amount of sludge is noted. Common bile duct:Not visualized. Liver: Slightly nodular hepatic margins are noted suggesting cirrhosis. No definite focal sonographic abnormality is noted. Portal vein is patent on color Doppler imaging with normal direction of blood flow towards the liver. IVC: No abnormality visualized. Pancreas: Not visualized due to overlying bowel gas. Spleen: Size and appearance within normal limits. Right Kidney: Length: 11.5 cm. Echogenicity within normal limits. No mass or hydronephrosis visualized. Left Kidney: Length: 11.3 cm. Echogenicity within normal limits. No mass or hydronephrosis visualized. Abdominal aorta: Not visualized. Other findings: None. IMPRESSION: Slightly nodular hepatic margins are noted suggesting hepatic cirrhosis. Common bile duct, pancreas and abdominal aorta were not visualized on this exam. Small amount of sludge noted within gallbladder. Electronically Signed   By: Lupita Raider M.D.   On: 04/17/2023 13:01   DG Chest Port 1 View  Result Date: 04/17/2023 CLINICAL DATA:  Abdominal pain and hypoxia.  [id number].  [id number]. EXAM: PORTABLE CHEST 1 VIEW PORTABLE SUPINE ABDOMEN, 2 FILMS COMPARISON:  Abdomen film earlier today at 11:04 a.m., portable chest 04/13/2023 FINDINGS: Portable chest at 11:31 p.m.: Prior NGT has been exchanged for a Dobbhoff feeding tube, which terminates in the gastric antrum in good position. Interstitial and faint patchy hazy opacities are noted in the lower lung fields but significantly improved. The upper lung fields have cleared. There is no substantial pleural effusion. Findings most likely  indicating clearing edema and/or pneumonia. The heart is enlarged. There is mild central vascular fullness with improvement. Mediastinum is normally  outlined. Interval removal prior ETT, left IJ central line. Portable supine abdomen, 11:35 p.m., 2 films: Again noted is a TIPS stent in the liver. There is aeration of the intestinal tract without pathologic dilatation with colonic gas through to the rectum. No pathologic calcifications or acute radiographic findings are seen and no supine evidence of free air. There are degenerative changes of the spine. No significant osseous findings. IMPRESSION: 1. Dobbhoff feeding tube terminates in the distal gastric antrum in good position. 2. Interstitial and faint patchy hazy opacities in the lower lung fields but significantly improved. Findings most likely indicating clearing edema and/or pneumonia. 3. Cardiomegaly. 4. Aeration throughout the intestinal tract, without pathologic dilatation. 5. TIPS stent in the liver. Electronically Signed   By: Almira Bar M.D.   On: 04/17/2023 00:07   DG Abd 1 View  Result Date: 04/17/2023 CLINICAL DATA:  Abdominal pain and hypoxia.  [id number].  [id number]. EXAM: PORTABLE CHEST 1 VIEW PORTABLE SUPINE ABDOMEN, 2 FILMS COMPARISON:  Abdomen film earlier today at 11:04 a.m., portable chest 04/13/2023 FINDINGS: Portable chest at 11:31 p.m.: Prior NGT has been exchanged for a Dobbhoff feeding tube, which terminates in the gastric antrum in good position. Interstitial and faint patchy hazy opacities are noted in the lower lung fields but significantly improved. The upper lung fields have cleared. There is no substantial pleural effusion. Findings most likely indicating clearing edema and/or pneumonia. The heart is enlarged. There is mild central vascular fullness with improvement. Mediastinum is normally outlined. Interval removal prior ETT, left IJ central line. Portable supine abdomen, 11:35 p.m., 2 films: Again noted is a TIPS stent in the  liver. There is aeration of the intestinal tract without pathologic dilatation with colonic gas through to the rectum. No pathologic calcifications or acute radiographic findings are seen and no supine evidence of free air. There are degenerative changes of the spine. No significant osseous findings. IMPRESSION: 1. Dobbhoff feeding tube terminates in the distal gastric antrum in good position. 2. Interstitial and faint patchy hazy opacities in the lower lung fields but significantly improved. Findings most likely indicating clearing edema and/or pneumonia. 3. Cardiomegaly. 4. Aeration throughout the intestinal tract, without pathologic dilatation. 5. TIPS stent in the liver. Electronically Signed   By: Almira Bar M.D.   On: 04/17/2023 00:07   DG Abd Portable 1V  Result Date: 04/16/2023 CLINICAL DATA:  Feeding tube placement EXAM: PORTABLE ABDOMEN - 1 VIEW limited for tube placement COMPARISON:  X-ray 04/15/2023 FINDINGS: Prior NG tube has been removed. New Dobbhoff tube with tip crossing to the right side of the abdomen, likely at the pylorus or proximal duodenum. Minimal slack in the stomach. Elsewhere note is made of a presumed tips shunt overlying the liver. Gaseous distention of bowel elsewhere in the visualized abdomen IMPRESSION: Limited x-ray for tube placement has a Dobbhoff tube with tip to the right of the abdomen. Likely pylorus or proximal duodenum Electronically Signed   By: Karen Kays M.D.   On: 04/16/2023 11:30   DG Abd Portable 1V  Result Date: 04/15/2023 CLINICAL DATA:  Nasogastric tube placement EXAM: PORTABLE ABDOMEN - 1 VIEW COMPARISON:  Portable exam 1415 hours compared to 04/12/2023 FINDINGS: 2 nasogastric tubes are identified, projecting over mid stomach and distal stomach. Air-filled nondistended colon. TIPS shunt RIGHT upper quadrant. Minimal atelectasis or infiltrate LEFT base. IMPRESSION: Two nasogastric tubes project over stomach as above. Electronically Signed   By: Ulyses Southward M.D.   On: 04/15/2023 14:30  Scheduled Meds:  arformoterol  15 mcg Nebulization BID   Chlorhexidine Gluconate Cloth  6 each Topical Daily   enoxaparin (LOVENOX) injection  40 mg Subcutaneous Daily   feeding supplement (PROSource TF20)  60 mL Per Tube Daily   folic acid  1 mg Per Tube Daily   free water  200 mL Per Tube Q4H   gabapentin  300 mg Per Tube Daily   Gerhardt's butt cream   Topical BID   hydrocortisone  10 mg Per Tube Daily   hydrocortisone  5 mg Per Tube Daily   insulin aspart  0-15 Units Subcutaneous Q4H   lactulose  10 g Per Tube TID   midodrine  5 mg Per Tube Q8H   multivitamin with minerals  1 tablet Per Tube Daily   pantoprazole  40 mg Intravenous Q12H   potassium chloride  20 mEq Per Tube BID   potassium chloride  20 mEq Per Tube TID   QUEtiapine  50 mg Per Tube QHS   revefenacin  175 mcg Nebulization Daily   rosuvastatin  40 mg Per Tube Daily   sodium chloride flush  10-40 mL Intracatheter Q12H   spironolactone  12.5 mg Per Tube Daily   thiamine  100 mg Per Tube Daily   Continuous Infusions:  sodium chloride 10 mL/hr at 04/16/23 1700   feeding supplement (VITAL 1.5 CAL) Stopped (04/17/23 0430)   potassium PHOSPHATE IVPB (in mmol) 30 mmol (04/17/23 0943)     LOS: 10 days      Huey Bienenstock, MD [facility]   To contact the attending provider between 7A-7P or the covering provider during after hours 7P-7A, please log into the web site [email or web address] and access using universal [place] password for that web site. If you do not have the password, please call the hospital operator.  04/17/2023, 1:12 PM

## 2023-04-18 ENCOUNTER — Other Ambulatory Visit: Payer: Self-pay | Admitting: Radiology

## 2023-04-18 DIAGNOSIS — K7030 Alcoholic cirrhosis of liver without ascites: Secondary | ICD-10-CM

## 2023-04-18 LAB — CBC
HCT: 33 % — ABNORMAL LOW (ref 39.0–52.0)
Hemoglobin: 10.5 g/dL — ABNORMAL LOW (ref 13.0–17.0)
MCH: 28.9 pg (ref 26.0–34.0)
MCHC: 31.8 g/dL (ref 30.0–36.0)
MCV: 90.9 fL (ref 80.0–100.0)
Platelets: 142 10*3/uL — ABNORMAL LOW (ref 150–400)
RBC: 3.63 MIL/uL — ABNORMAL LOW (ref 4.22–5.81)
RDW: 18.6 % — ABNORMAL HIGH (ref 11.5–15.5)
WBC: 13.9 10*3/uL — ABNORMAL HIGH (ref 4.0–10.5)
nRBC: 0 % (ref 0.0–0.2)

## 2023-04-18 LAB — GLUCOSE, CAPILLARY
Glucose-Capillary: 139 mg/dL — ABNORMAL HIGH (ref 70–99)
Glucose-Capillary: 134 mg/dL — ABNORMAL HIGH (ref 70–99)
Glucose-Capillary: 168 mg/dL — ABNORMAL HIGH (ref 70–99)
Glucose-Capillary: 146 mg/dL — ABNORMAL HIGH (ref 70–99)
Glucose-Capillary: 157 mg/dL — ABNORMAL HIGH (ref 70–99)
Glucose-Capillary: 136 mg/dL — ABNORMAL HIGH (ref 70–99)

## 2023-04-18 LAB — BASIC METABOLIC PANEL
Anion gap: 8 (ref 5–15)
BUN: 6 mg/dL (ref 6–20)
CO2: 20 mmol/L — ABNORMAL LOW (ref 22–32)
Calcium: 8.4 mg/dL — ABNORMAL LOW (ref 8.9–10.3)
Chloride: 113 mmol/L — ABNORMAL HIGH (ref 98–111)
Creatinine, Ser: 0.57 mg/dL — ABNORMAL LOW (ref 0.61–1.24)
GFR, Estimated: >60 mL/min (ref >60)
Glucose, Bld: 139 mg/dL — ABNORMAL HIGH (ref 70–99)
Potassium: 3.9 mmol/L (ref 3.5–5.1)
Sodium: 141 mmol/L (ref 135–145)

## 2023-04-18 LAB — PHOSPHORUS: Phosphorus: 2.3 mg/dL — ABNORMAL LOW (ref 2.5–4.6)

## 2023-04-18 LAB — MAGNESIUM: Magnesium: 2 mg/dL (ref 1.7–2.4)

## 2023-04-18 MED ORDER — PROPRANOLOL HCL 10 MG PO TABS
10.0000 mg | ORAL_TABLET | Freq: Three times a day (TID) | ORAL | Status: DC
  Administered 2023-04-18 – 2023-04-21 (×11): 10 mg
  Filled 2023-04-18 (×12): qty 1

## 2023-04-18 MED ORDER — CHLORDIAZEPOXIDE HCL 5 MG PO CAPS: 10.0000 mg | ORAL_CAPSULE | Freq: Three times a day (TID) | ORAL | Status: DC

## 2023-04-18 MED ORDER — HYDROXYZINE HCL 25 MG PO TABS
50.0000 mg | ORAL_TABLET | Freq: Once | ORAL | Status: AC | PRN
  Administered 2023-04-18: 50 mg via ORAL
  Filled 2023-04-18: qty 2

## 2023-04-18 MED ORDER — QUETIAPINE FUMARATE 50 MG PO TABS
75.0000 mg | ORAL_TABLET | Freq: Every day | ORAL | Status: DC
  Administered 2023-04-18 – 2023-04-24 (×7): 75 mg
  Filled 2023-04-18 (×8): qty 1

## 2023-04-18 MED ORDER — CHLORDIAZEPOXIDE HCL 5 MG PO CAPS
5.0000 mg | ORAL_CAPSULE | Freq: Three times a day (TID) | ORAL | Status: DC
  Administered 2023-04-18 – 2023-04-20 (×5): 5 mg via ORAL
  Filled 2023-04-18 (×5): qty 1

## 2023-04-18 MED ORDER — OXYCODONE HCL 5 MG PO TABS
2.5000 mg | ORAL_TABLET | ORAL | Status: DC | PRN
  Administered 2023-04-18 – 2023-04-29 (×24): 2.5 mg
  Filled 2023-04-18 (×25): qty 1

## 2023-04-18 MED ORDER — K PHOS MONO-SOD PHOS DI & MONO 155-852-130 MG PO TABS
500.0000 mg | ORAL_TABLET | Freq: Three times a day (TID) | ORAL | Status: AC
  Administered 2023-04-18 – 2023-04-20 (×9): 500 mg
  Filled 2023-04-18 (×9): qty 2

## 2023-04-18 MED ORDER — HYDROMORPHONE HCL 1 MG/ML IJ SOLN
1.0000 mg | INTRAMUSCULAR | Status: AC | PRN
  Administered 2023-04-18: 1 mg via INTRAVENOUS
  Filled 2023-04-18: qty 1

## 2023-04-18 MED ORDER — CHLORDIAZEPOXIDE HCL 5 MG PO CAPS
10.0000 mg | ORAL_CAPSULE | Freq: Four times a day (QID) | ORAL | Status: DC
  Administered 2023-04-18: 10 mg
  Filled 2023-04-18 (×2): qty 2

## 2023-04-18 MED ORDER — OXYCODONE HCL 5 MG PO TABS: 5.0000 mg | ORAL_TABLET | ORAL | Status: DC | PRN

## 2023-04-18 NOTE — Care Management Important Message (Signed)
Important Message  Patient Details  Name: Ethan Wells MRN: [id number] Date of Birth: 1971-03-15   Medicare Important Message Given:  Yes     Dorena Bodo 04/18/2023, 2:57 PM

## 2023-04-18 NOTE — Plan of Care (Signed)
  Problem: Education: Goal: Knowledge of General Education information will improve Description: Including pain rating scale, medication(s)/side effects and non-pharmacologic comfort measures 04/18/2023 0537 by Garret Reddish, RN Outcome: Progressing 04/18/2023 0537 by Garret Reddish, RN Outcome: Progressing

## 2023-04-18 NOTE — Progress Notes (Signed)
PROGRESS NOTE    Ethan ROSHTO  Wells:[id number] DOB: [birth year] DOA: 04/07/2023 PCP: [name], NP    No chief complaint on file.   Brief Narrative:   Ethan Wells is a 52 yo male with a PMH significant for recurrent GI bleed with known variceal bleed, ischemic cardiomyopathy, EtOH abuse, polysubstance abuse, HTN, HLD, depression, and anxiety who presented to [facility] ED 4/22 for acute GI bleed. Of note patient was seen within this health sytem 1 month ago for significant GI bleed requiring banding per GI with TIPS procedure recommended but patient declined. Patient reports last drink and cocaine use was day prior to admission.   On ED arrival patient was seen hypotensive and mildly tachycardic. Lab work significant for K 2.3, glucoses 140, Mg 1.2, alkaline phos 153, albumin 2.3, AST 44, total protein 5.6, total bili 1.6, lactic 5.6, hgb 7.1, and hct 23.2. Given active bleed with compromised hemodynamics, PCCM consulted for further management and admission.   Significant events during hospital stay  4/22 admitted with acute GI bleed  4/23 EGD >> X esophagus, grade 2 varices, banded x 2, portal gastropathy 4/23 intubated for flash pulm edema , started on Lasix and proned 4/25 extubated 4/27 TIPS, re intubated for procedure. Did not meet extubation criteria and was returned to ICU on vent.  4/29 remains intubated, mental status precluding extubation 4/30 NGT placed, extubated 5/2 [facility]    Assessment & Plan:   Principal Problem:   GI bleed Active Problems:   Anemia due to GI blood loss   Acute hypoxic respiratory failure (HCC)   Secondary esophageal varices with bleeding (HCC)   Esophageal varices with bleeding in diseases classified elsewhere (HCC)   Hepatic encephalopathy (HCC)   Alcoholic cirrhosis (HCC)  Acute respiratory failure with hypoxia, resolving - Possible transfused ARDS. Extubated 4/25, but reintubated  4/27 for TIPS. Returned from IR intubated, extubated 4/30.  -PT/mobilize -pulmonary hygiene -Will encourage to use spirometry and flutter valve.   Hypernatremia Hypophosphatemia Hypokalemia -Continue to monitor closely, and replete as needed.   Alcoholic liver cirrhosis  variceal bleed /portal gastropathy Thrombocytopenia, mild - Post banding, TIPS - con't PPI Q12h - no need for BB from EV ppx standpoint - Repeat surveillance EGD in 3 weeks, TIPS duplex ordered by GI - transfuse for Hb <7 or hemodynamically significant bleeding - -completed empiric ceftriaxone -appreciate GI's management -decrease to lactulose 60mg  TID, needs 2-3 BM's per day. Rifaximin when taking PO -Will add propranolol given elevated blood pressure today and history of esophageal varices     Hx Systolic congestive heart failure (EF 50-55% with WMA 02/04/23), HoTN CAD, HTN, HLD P: -PTA midodrine -lasix -monitor on tele -resume PTA spironolactone -hold PTA torsemide; using IV diuretics currently -minimize IVF   Hx adrenal insufficiency  -continue home hydrocort   Acute metabolic encephalopathy, hepatic encephalopathy Hx polysubstance abuse including EtOH improving - vitamins - home seroquel 25 mg qhs -Patient currently off Precedex -He is somnolent, but wakes up, interactive today -Continue with low-dose Librium for now.      DVT prophylaxis: Lovenox Code Status: Full Family Communication: none at bedside Disposition:   Status is: Inpatient    Consultants:  PCCM GI   Subjective:  With complaints of abdominal pain overnight, he had some confusion as well.  Objective: Vitals:   04/18/23 0757 04/18/23 0923 04/18/23 1022 04/18/23 1211  BP: (!) 153/85  118/60 137/72  Pulse: (!) 109 (!) 103 100 90  Resp: (!) 22 (!) 24 18 (!) 22  Temp:   99 F (37.2 C) 97.9 F (36.6 C)  TempSrc:   Oral Oral  SpO2:  96% 99%   Weight:      Height:        Intake/Output Summary (Last 24 hours) at  04/18/2023 1302 Last data filed at 04/18/2023 1247 Gross per 24 hour  Intake 656.97 ml  Output 1300 ml  Net -643.03 ml   Filed Weights   04/14/23 0105 04/15/23 0331 04/16/23 0429  Weight: 92.3 kg 87.4 kg 85.1 kg    Examination:  The somnolent, but wakes up, remains confused, chronically ill-appearing, core Trak tube present Symmetrical Chest wall movement, Good air movement bilaterally, CTAB RRR,No Gallops,Rubs or new Murmurs, No Parasternal Heave +ve B.Sounds, Abd Soft, No tenderness, No rebound - guarding or rigidity. No Cyanosis, Clubbing or edema, No new Rash or bruise      Data Reviewed: I have personally reviewed following labs and imaging studies  CBC: Recent Labs  Lab 04/14/23 0332 04/15/23 0453 04/16/23 0431 04/17/23 0935 04/18/23 0433  WBC 16.6* 9.3 7.8 10.5 13.9*  NEUTROABS  --   --   --  7.1  --   HGB 9.6* 9.8* 9.6* 9.3* 10.5*  HCT 29.9* 30.8* 31.3* 29.1* 33.0*  MCV 90.6 91.7 94.3 91.8 90.9  PLT 143* 106* 114* 118* 142*    Basic Metabolic Panel: Recent Labs  Lab 04/14/23 0332 04/14/23 1239 04/15/23 0453 04/15/23 1906 04/16/23 0431 04/16/23 1611 04/17/23 0502 04/18/23 0433  NA 141  --  144  --  150*  --  143 141  K 3.2*   < > 3.1*  --  2.8* 3.0* 3.0* 3.9  CL 107  --  110  --  117*  --  107 113*  CO2 28  --  28  --  26  --  24 20*  GLUCOSE 124*  --  158*  --  175*  --  133* 139*  BUN 7  --  10  --  7  --  8 6  CREATININE 0.74  --  0.68  --  0.61  --  0.64 0.57*  CALCIUM 7.8*  --  8.0*  --  8.6*  --  7.9* 8.4*  MG 2.2  --  1.8  --  2.1 2.0 1.7 2.0  PHOS  --   --  2.3* 1.8* 1.5* 3.4 1.9* 2.3*   < > = values in this interval not displayed.    GFR: Estimated Creatinine Clearance: 110.8 mL/min (A) (by C-G formula based on SCr of 0.57 mg/dL (L)).  Liver Function Tests: Recent Labs  Lab 04/12/23 0551 04/17/23 0502  AST 54* 69*  ALT 24 44  ALKPHOS 110 181*  BILITOT 3.3* 2.6*  PROT 5.3* 5.8*  ALBUMIN 2.5* 2.2*    CBG: Recent Labs  Lab  04/17/23 2005 04/17/23 2307 04/18/23 0337 04/18/23 0756 04/18/23 1213  GLUCAP 156* 123* 139* 134* 168*     Recent Results (from the past 240 hour(s))  Culture, Respiratory w Gram Stain     Status: None   Collection Time: 04/13/23 11:35 AM   Specimen: Tracheal Aspirate; Respiratory  Result Value Ref Range Status   Specimen Description TRACHEAL ASPIRATE  Final   Special Requests NONE  Final   Gram Stain   Final    FEW WBC PRESENT, PREDOMINANTLY PMN NO ORGANISMS SEEN    Culture   Final    RARE Normal respiratory flora-no  Staph aureus or Pseudomonas seen Performed at [facility], [address] [place], [place] [address]    Report Status 04/15/2023 FINAL  Final         Radiology Studies: US Abdomen Complete  Result Date: 04/17/2023 CLINICAL DATA:  Abdominal pain. EXAM: ABDOMEN ULTRASOUND COMPLETE COMPARISON:  February 28, 2023. FINDINGS: Gallbladder: No gallstones or wall thickening visualized. No sonographic Murphy sign noted by sonographer. Mild amount of sludge is noted. Common bile duct:Not visualized. Liver: Slightly nodular hepatic margins are noted suggesting cirrhosis. No definite focal sonographic abnormality is noted. Portal vein is patent on color Doppler imaging with normal direction of blood flow towards the liver. IVC: No abnormality visualized. Pancreas: Not visualized due to overlying bowel gas. Spleen: Size and appearance within normal limits. Right Kidney: Length: 11.5 cm. Echogenicity within normal limits. No mass or hydronephrosis visualized. Left Kidney: Length: 11.3 cm. Echogenicity within normal limits. No mass or hydronephrosis visualized. Abdominal aorta: Not visualized. Other findings: None. IMPRESSION: Slightly nodular hepatic margins are noted suggesting hepatic cirrhosis. Common bile duct, pancreas and abdominal aorta were not visualized on this exam. Small amount of sludge noted within gallbladder. Electronically Signed   By: Lupita Raider M.D.   On:  04/17/2023 13:01   DG Chest Port 1 View  Result Date: 04/17/2023 CLINICAL DATA:  Abdominal pain and hypoxia.  [id number].  [id number]. EXAM: PORTABLE CHEST 1 VIEW PORTABLE SUPINE ABDOMEN, 2 FILMS COMPARISON:  Abdomen film earlier today at 11:04 a.m., portable chest 04/13/2023 FINDINGS: Portable chest at 11:31 p.m.: Prior NGT has been exchanged for a Dobbhoff feeding tube, which terminates in the gastric antrum in good position. Interstitial and faint patchy hazy opacities are noted in the lower lung fields but significantly improved. The upper lung fields have cleared. There is no substantial pleural effusion. Findings most likely indicating clearing edema and/or pneumonia. The heart is enlarged. There is mild central vascular fullness with improvement. Mediastinum is normally outlined. Interval removal prior ETT, left IJ central line. Portable supine abdomen, 11:35 p.m., 2 films: Again noted is a TIPS stent in the liver. There is aeration of the intestinal tract without pathologic dilatation with colonic gas through to the rectum. No pathologic calcifications or acute radiographic findings are seen and no supine evidence of free air. There are degenerative changes of the spine. No significant osseous findings. IMPRESSION: 1. Dobbhoff feeding tube terminates in the distal gastric antrum in good position. 2. Interstitial and faint patchy hazy opacities in the lower lung fields but significantly improved. Findings most likely indicating clearing edema and/or pneumonia. 3. Cardiomegaly. 4. Aeration throughout the intestinal tract, without pathologic dilatation. 5. TIPS stent in the liver. Electronically Signed   By: Almira Bar M.D.   On: 04/17/2023 00:07   DG Abd 1 View  Result Date: 04/17/2023 CLINICAL DATA:  Abdominal pain and hypoxia.  [id number].  [id number]. EXAM: PORTABLE CHEST 1 VIEW PORTABLE SUPINE ABDOMEN, 2 FILMS COMPARISON:  Abdomen film earlier today at 11:04 a.m., portable chest 04/13/2023 FINDINGS: Portable  chest at 11:31 p.m.: Prior NGT has been exchanged for a Dobbhoff feeding tube, which terminates in the gastric antrum in good position. Interstitial and faint patchy hazy opacities are noted in the lower lung fields but significantly improved. The upper lung fields have cleared. There is no substantial pleural effusion. Findings most likely indicating clearing edema and/or pneumonia. The heart is enlarged. There is mild central vascular fullness with improvement. Mediastinum is normally outlined. Interval removal prior ETT, left IJ  central line. Portable supine abdomen, 11:35 p.m., 2 films: Again noted is a TIPS stent in the liver. There is aeration of the intestinal tract without pathologic dilatation with colonic gas through to the rectum. No pathologic calcifications or acute radiographic findings are seen and no supine evidence of free air. There are degenerative changes of the spine. No significant osseous findings. IMPRESSION: 1. Dobbhoff feeding tube terminates in the distal gastric antrum in good position. 2. Interstitial and faint patchy hazy opacities in the lower lung fields but significantly improved. Findings most likely indicating clearing edema and/or pneumonia. 3. Cardiomegaly. 4. Aeration throughout the intestinal tract, without pathologic dilatation. 5. TIPS stent in the liver. Electronically Signed   By: Almira Bar M.D.   On: 04/17/2023 00:07        Scheduled Meds:  arformoterol  15 mcg Nebulization BID   Chlorhexidine Gluconate Cloth  6 each Topical Daily   enoxaparin (LOVENOX) injection  40 mg Subcutaneous Daily   feeding supplement (PROSource TF20)  60 mL Per Tube Daily   folic acid  1 mg Per Tube Daily   free water  200 mL Per Tube Q4H   gabapentin  300 mg Per Tube Daily   Gerhardt's butt cream   Topical BID   hydrocortisone  10 mg Per Tube Daily   hydrocortisone  5 mg Per Tube Daily   insulin aspart  0-15 Units Subcutaneous Q4H   lactulose  10 g Per Tube TID   midodrine   5 mg Per Tube Q8H   multivitamin with minerals  1 tablet Per Tube Daily   pantoprazole  40 mg Intravenous Q12H   phosphorus  500 mg Per Tube TID   potassium chloride  20 mEq Per Tube BID   propranolol  10 mg Per Tube TID   QUEtiapine  50 mg Per Tube QHS   revefenacin  175 mcg Nebulization Daily   rosuvastatin  40 mg Per Tube Daily   sodium chloride flush  10-40 mL Intracatheter Q12H   spironolactone  12.5 mg Per Tube Daily   thiamine  100 mg Per Tube Daily   Continuous Infusions:  sodium chloride Stopped (04/17/23 0000)   feeding supplement (VITAL 1.5 CAL) 1,000 mL (04/18/23 1248)     LOS: 11 days      Huey Bienenstock, MD [facility]   To contact the attending provider between 7A-7P or the covering provider during after hours 7P-7A, please log into the web site [email or web address] and access using universal [place] password for that web site. If you do not have the password, please call the hospital operator.  04/18/2023, 1:02 PM

## 2023-04-18 NOTE — Progress Notes (Signed)
Speech Language Pathology Treatment: Dysphagia  Patient Details Name: GIOVANN SALAHUDDIN MRN: [id number] DOB: 06-08-1971 Today's Date: 04/18/2023 Time: 0710-0726 SLP Time Calculation (min) (ACUTE ONLY): 16 min  Assessment / Plan / Recommendation Clinical Impression  Patient seen for po or instrumental swallow evaluation readiness potentially after TIPS today. He is currently npo for TIPS procedure today x sips with meds - therefore SLP only provided single tsp juice, single tsp water and single ice chips. He is lethargic but he did participate in session. Pt required total cues fading to mid cues to help brush his dentition, lingual surface posterior appears slightly white but pt denies lingual discomfort. He is severely dysarthric, reports chills and is warm to the touch, thus SLP messaged RN requesting temp check as he is scheduled for procedure today. Immediate throat clear post-swallow of tsp water - suspect potential airway infiltration prior to swallow initiation. No clinical indication of aspiration with tsp nectar and ice chip x1. Currently pt's voice is weak but clear when he speaks thus he is demonstrating clear improvement. Will follow up for po readiness, recommend npo x ice chips. Pt educated to plan to improved phonation strength and speech clarity prior to po consideration using teach back and pt agreeable. Anticipate with improved mentation, he will be appropriate for po.  Will continue efforts.   HPI HPI: Patient is a 52 y.o. male with PMH: recurrent GI bleed with known variceal bleed, ischemic cardiomyopathy, ETOH abuse, polysubstance abuse, HTN, HLD, depression and anxiety. He presented to [facility] ED on 04/07/23 with acute GI bleed. He was admitted previous month for GI bleed and TIPS procedure recommended at that time but patient declined it. In ED, patient was hypotensive, mildly tachycardic. EGD completed 4/23. He was intubated 4/23 for flash pulmonary edema and extubated 4/25. He was  reintubated on 4/27 for TIPS procedure but unable to be extubated following procedure. On 4/30 NG was placed and patient was extubated.  Pt has undergone BSE with recommendation for NPO -      SLP Plan  Continue with current plan of care      Recommendations for follow up therapy are one component of a multi-disciplinary discharge planning process, led by the attending physician.  Recommendations may be updated based on patient status, additional functional criteria and insurance authorization.    Recommendations  Diet recommendations: Other(comment) (single ice chip only)                  Oral care QID;Staff/trained caregiver to provide oral care     Dysphagia, unspecified (R13.10)     Continue with current plan of care   Rolena Infante, MS [facility] SLP Acute Rehab Services Office [phone]   Chales Abrahams  04/18/2023, 7:57 AM

## 2023-04-18 NOTE — Plan of Care (Signed)
  Problem: Education: Goal: Knowledge of General Education information will improve Description Including pain rating scale, medication(s)/side effects and non-pharmacologic comfort measures Outcome: Progressing   

## 2023-04-18 NOTE — Progress Notes (Signed)
Pt has been awake an c/o pain in the abd all night, Dr ordered pain medication an it was given, pt has high anxiety an was medicated for that as well, pt was bathed and full linen change hoping that would help, but it did not, pt is worried about PT not working with him enough an he is getting weaker an weaker, per the pt. I explained to the pt that his labs are looking better each day and he is more alert and aware of things going on than he was, I explained the labs to him and the results from imaging as well an that we could not see any reason for his pain, yet he still c/o 8/10 pain in his lower abd and now he is c/o lower back pain as well. Notified the Dr. Rennis Petty the new pain in pt's lower back.

## 2023-04-18 NOTE — Progress Notes (Signed)
Physical Therapy Treatment Patient Details Name: Ethan Wells MRN: [id number] DOB: [birth year] Today's Date: 04/18/2023   History of Present Illness Ethan Wells is a 52 yo male presented to [facility] ED 4/22 for acute GI bleed. Of note patient was seen within this health sytem 1 month ago for significant GI bleed requiring banding per GI with TIPS procedure recommended but pt declined. Pt transferred to [facility] due to sepsis resulting in VDRF 4/23-4/25.  Patient reports last drink and cocaine use prior to admit was day prior to admission.   TIPS procedure 4/26  with pt VDRF 4/26-4/29.    PMH significant for recurrent GI bleed with known variceal bleed, ischemic cardiomyopathy, EtOH abuse, polysubstance abuse, HTN, HLD, depression, and anxiety    PT Comments    Pt tolerated today's session well, limited by fatigue after mobility, but appears less restless and impulsive than previous session. Pt able to stand x2 trials from EOB and perform side stepping towards HOB, seated rest break required between trials. Pt requires increased time for all mobility, with slow processing noted, but able to follow commands with the increased time. Pt fatigued after two trials of side stepping, returned to supine with minA for BLE management. Acute PT will continue to follow up with pt to progress mobility and address deficits, discharge recommendations remain appropriate.     Recommendations for follow up therapy are one component of a multi-disciplinary discharge planning process, led by the attending physician.  Recommendations may be updated based on patient status, additional functional criteria and insurance authorization.  Follow Up Recommendations  Can patient physically be transported by private vehicle: No    Assistance Recommended at Discharge Frequent or constant Supervision/Assistance  Patient can return home with the following A little help with bathing/dressing/bathroom;Assistance with  cooking/housework;Assist for transportation;Help with stairs or ramp for entrance;A lot of help with walking and/or transfers   Equipment Recommendations  Rollator (4 wheels)    Recommendations for Other Services       Precautions / Restrictions Precautions Precautions: Fall Restrictions Weight Bearing Restrictions: No     Mobility  Bed Mobility Overal bed mobility: Needs Assistance Bed Mobility: Supine to Sit, Sit to Supine     Supine to sit: Min assist, HOB elevated Sit to supine: Min assist   General bed mobility comments: minA for BLE management for supine<>sit with minA and use of bed pad to pivot hips to EOB to get B feet on the floor    Transfers Overall transfer level: Needs assistance Equipment used: Rolling walker (2 wheels) Transfers: Sit to/from Stand Sit to Stand: Mod assist           General transfer comment: Pt requiring modA to stand from the bed, cueing for proper hand placement and use of RW, increased time to obtain full upright position and decrease trunk flexion. Standing x2 trials    Ambulation/Gait               General Gait Details: side steps along EOB with modA for balance, RW management, and sequencing of steps, requiring a seated rest break before getting to the [facility]   Stairs             Wheelchair Mobility    Modified Rankin (Stroke Patients Only)       Balance Overall balance assessment: Needs assistance Sitting-balance support: Feet supported, Bilateral upper extremity supported Sitting balance-Leahy Scale: Fair     Standing balance support: Bilateral upper extremity supported, During functional activity, Reliant  on assistive device for balance Standing balance-Leahy Scale: Poor Standing balance comment: Needed UE and external support                            Cognition Arousal/Alertness: Awake/alert, Lethargic Behavior During Therapy: Flat affect Overall Cognitive Status: No family/caregiver  present to determine baseline cognitive functioning                                 General Comments: pt oriented to self, place, and time, requires increased time for command following and processing but eager to participate. Intermittently confused discussing bed bugs but charge RN reports there are none. Pt awake but appears drowsy throughout session        Exercises      General Comments General comments (skin integrity, edema, etc.): VSS on room air, HR up to 110s with mobility      Pertinent Vitals/Pain Pain Assessment Pain Assessment: Faces Faces Pain Scale: Hurts little more Pain Location: abdomen Pain Descriptors / Indicators: Aching, Grimacing, Guarding Pain Intervention(s): Limited activity within patient's tolerance, Monitored during session, Repositioned    Home Living                          Prior Function            PT Goals (current goals can now be found in the care plan section) Acute Rehab PT Goals Patient Stated Goal: to go back to group home PT Goal Formulation: With patient Time For Goal Achievement: 04/25/23 Potential to Achieve Goals: Good Progress towards PT goals: Progressing toward goals    Frequency    Min 3X/week      PT Plan Current plan remains appropriate    Co-evaluation              AM-PAC PT "6 Clicks" Mobility   Outcome Measure  Help needed turning from your back to your side while in a flat bed without using bedrails?: A Little Help needed moving from lying on your back to sitting on the side of a flat bed without using bedrails?: A Little Help needed moving to and from a bed to a chair (including a wheelchair)?: A Lot Help needed standing up from a chair using your arms (e.g., wheelchair or bedside chair)?: A Lot Help needed to walk in hospital room?: Total Help needed climbing 3-5 steps with a railing? : Total 6 Click Score: 12    End of Session Equipment Utilized During Treatment: Gait  belt Activity Tolerance: Patient limited by fatigue Patient left: with call bell/phone within reach;in bed;with bed alarm set Nurse Communication: Mobility status PT Visit Diagnosis: Unsteadiness on feet (R26.81);Muscle weakness (generalized) (M62.81)     Time: 1610-9604 PT Time Calculation (min) (ACUTE ONLY): 23 min  Charges:  $Therapeutic Activity: 23-37 mins                     Lindalou Hose, PT DPT Acute Rehabilitation Services Office [phone]    Leonie Man 04/18/2023, 2:08 PM

## 2023-04-18 NOTE — Care Management Important Message (Signed)
Important Message  Patient Details  Name: Ethan Wells MRN: [id number] Date of Birth: 01/20/1971   Medicare Important Message Given:  Yes     Dorena Bodo 04/18/2023, 2:56 PM

## 2023-04-19 LAB — PHOSPHORUS: Phosphorus: 2.4 mg/dL — ABNORMAL LOW (ref 2.5–4.6)

## 2023-04-19 LAB — CBC
HCT: 31.2 % — ABNORMAL LOW (ref 39.0–52.0)
Hemoglobin: 10 g/dL — ABNORMAL LOW (ref 13.0–17.0)
MCH: 29.3 pg (ref 26.0–34.0)
MCHC: 32.1 g/dL (ref 30.0–36.0)
MCV: 91.5 fL (ref 80.0–100.0)
Platelets: 137 10*3/uL — ABNORMAL LOW (ref 150–400)
RBC: 3.41 MIL/uL — ABNORMAL LOW (ref 4.22–5.81)
RDW: 18.9 % — ABNORMAL HIGH (ref 11.5–15.5)
WBC: 15.7 10*3/uL — ABNORMAL HIGH (ref 4.0–10.5)
nRBC: 0 % (ref 0.0–0.2)

## 2023-04-19 LAB — BASIC METABOLIC PANEL
Anion gap: 6 (ref 5–15)
BUN: 7 mg/dL (ref 6–20)
CO2: 18 mmol/L — ABNORMAL LOW (ref 22–32)
Calcium: 8 mg/dL — ABNORMAL LOW (ref 8.9–10.3)
Chloride: 113 mmol/L — ABNORMAL HIGH (ref 98–111)
Creatinine, Ser: 0.61 mg/dL (ref 0.61–1.24)
GFR, Estimated: >60 mL/min (ref >60)
Glucose, Bld: 136 mg/dL — ABNORMAL HIGH (ref 70–99)
Potassium: 3.9 mmol/L (ref 3.5–5.1)
Sodium: 137 mmol/L (ref 135–145)

## 2023-04-19 LAB — AMMONIA: Ammonia: 72 umol/L — ABNORMAL HIGH (ref 9–35)

## 2023-04-19 LAB — GLUCOSE, CAPILLARY
Glucose-Capillary: 125 mg/dL — ABNORMAL HIGH (ref 70–99)
Glucose-Capillary: 129 mg/dL — ABNORMAL HIGH (ref 70–99)
Glucose-Capillary: 130 mg/dL — ABNORMAL HIGH (ref 70–99)
Glucose-Capillary: 155 mg/dL — ABNORMAL HIGH (ref 70–99)
Glucose-Capillary: 158 mg/dL — ABNORMAL HIGH (ref 70–99)
Glucose-Capillary: 171 mg/dL — ABNORMAL HIGH (ref 70–99)
Glucose-Capillary: 177 mg/dL — ABNORMAL HIGH (ref 70–99)

## 2023-04-19 LAB — MAGNESIUM: Magnesium: 2 mg/dL (ref 1.7–2.4)

## 2023-04-19 MED ORDER — FUROSEMIDE 20 MG PO TABS
20.0000 mg | ORAL_TABLET | Freq: Every day | ORAL | Status: DC
  Administered 2023-04-19 – 2023-04-29 (×10): 20 mg via ORAL
  Filled 2023-04-19 (×10): qty 1

## 2023-04-19 MED ORDER — HYDROMORPHONE HCL 1 MG/ML IJ SOLN
0.5000 mg | Freq: Once | INTRAMUSCULAR | Status: AC
  Administered 2023-04-19: 0.5 mg via INTRAVENOUS
  Filled 2023-04-19: qty 0.5

## 2023-04-19 NOTE — Plan of Care (Signed)
  Problem: Education: Goal: Knowledge of General Education information will improve Description: Including pain rating scale, medication(s)/side effects and non-pharmacologic comfort measures Outcome: Progressing   Problem: Health Behavior/Discharge Planning: Goal: Ability to manage health-related needs will improve Outcome: Progressing   Problem: Clinical Measurements: Goal: Ability to maintain clinical measurements within normal limits will improve Outcome: Progressing Goal: Will remain free from infection Outcome: Progressing Goal: Diagnostic test results will improve Outcome: Progressing Goal: Respiratory complications will improve Outcome: Progressing Goal: Cardiovascular complication will be avoided Outcome: Progressing   Problem: Activity: Goal: Risk for activity intolerance will decrease Outcome: Progressing   Problem: Nutrition: Goal: Adequate nutrition will be maintained Outcome: Progressing   Problem: Coping: Goal: Level of anxiety will decrease Outcome: Progressing   Problem: Elimination: Goal: Will not experience complications related to bowel motility Outcome: Progressing Goal: Will not experience complications related to urinary retention Outcome: Progressing   Problem: Pain Managment: Goal: General experience of comfort will improve Outcome: Progressing   Problem: Safety: Goal: Ability to remain free from injury will improve Outcome: Progressing   Problem: Skin Integrity: Goal: Risk for impaired skin integrity will decrease Outcome: Progressing   Problem: Education: Goal: Ability to describe self-care measures that may prevent or decrease complications (Diabetes Survival Skills Education) will improve Outcome: Progressing Goal: Individualized Educational Video(s) Outcome: Progressing   Problem: Coping: Goal: Ability to adjust to condition or change in health will improve Outcome: Progressing   Problem: Fluid Volume: Goal: Ability to  maintain a balanced intake and output will improve Outcome: Progressing   Problem: Health Behavior/Discharge Planning: Goal: Ability to identify and utilize available resources and services will improve Outcome: Progressing Goal: Ability to manage health-related needs will improve Outcome: Progressing   Problem: Metabolic: Goal: Ability to maintain appropriate glucose levels will improve Outcome: Progressing   Problem: Nutritional: Goal: Maintenance of adequate nutrition will improve Outcome: Progressing Goal: Progress toward achieving an optimal weight will improve Outcome: Progressing   Problem: Skin Integrity: Goal: Risk for impaired skin integrity will decrease Outcome: Progressing   Problem: Tissue Perfusion: Goal: Adequacy of tissue perfusion will improve Outcome: Progressing   Problem: Education: Goal: Understanding of CV disease, CV risk reduction, and recovery process will improve Outcome: Progressing Goal: Individualized Educational Video(s) Outcome: Progressing   Problem: Activity: Goal: Ability to return to baseline activity level will improve Outcome: Progressing   Problem: Cardiovascular: Goal: Ability to achieve and maintain adequate cardiovascular perfusion will improve Outcome: Progressing Goal: Vascular access site(s) Level 0-1 will be maintained Outcome: Progressing   Problem: Health Behavior/Discharge Planning: Goal: Ability to safely manage health-related needs after discharge will improve Outcome: Progressing   Problem: Safety: Goal: Non-violent Restraint(s) Outcome: Progressing   Problem: Activity: Goal: Ability to tolerate increased activity will improve Outcome: Progressing   Problem: Respiratory: Goal: Ability to maintain a clear airway and adequate ventilation will improve Outcome: Progressing   Problem: Role Relationship: Goal: Method of communication will improve Outcome: Progressing   

## 2023-04-19 NOTE — Progress Notes (Signed)
Pt resting w/o any signs of distress, even chest rise and fall, no needs at this time. 

## 2023-04-19 NOTE — Progress Notes (Signed)
PROGRESS NOTE    ERVINE PEMBLETON  ZOX:[id number] DOB: 04/09/1971 DOA: 04/07/2023 PCP: [name], NP    No chief complaint on file.   Brief Narrative:   Ethan Wells is a 52 yo male with a PMH significant for recurrent GI bleed with known variceal bleed, ischemic cardiomyopathy, EtOH abuse, polysubstance abuse, HTN, HLD, depression, and anxiety who presented to [facility] ED 4/22 for acute GI bleed. Of note patient was seen within this health sytem 1 month ago for significant GI bleed requiring banding per GI with TIPS procedure recommended but patient declined. Patient reports last drink and cocaine use was day prior to admission.   On ED arrival patient was seen hypotensive and mildly tachycardic. Lab work significant for K 2.3, glucoses 140, Mg 1.2, alkaline phos 153, albumin 2.3, AST 44, total protein 5.6, total bili 1.6, lactic 5.6, hgb 7.1, and hct 23.2. Given active bleed with compromised hemodynamics, PCCM consulted for further management and admission.   Significant events during hospital stay  4/22 admitted with acute GI bleed  4/23 EGD >> X esophagus, grade 2 varices, banded x 2, portal gastropathy 4/23 intubated for flash pulm edema , started on Lasix and proned 4/25 extubated 4/27 TIPS, re intubated for procedure. Did not meet extubation criteria and was returned to ICU on vent.  4/29 remains intubated, mental status precluding extubation 4/30 NGT placed, extubated 5/2 [facility]    Assessment & Plan:   Principal Problem:   GI bleed Active Problems:   Anemia due to GI blood loss   Acute hypoxic respiratory failure (HCC)   Secondary esophageal varices with bleeding (HCC)   Esophageal varices with bleeding in diseases classified elsewhere (HCC)   Hepatic encephalopathy (HCC)   Alcoholic cirrhosis (HCC)  Acute respiratory failure with hypoxia, resolving - Possible transfused ARDS. Extubated 4/25, but reintubated  4/27 for TIPS. Returned from IR intubated, extubated 4/30.  -PT/mobilize -pulmonary hygiene -Will encourage to use spirometry and flutter valve.   Hypernatremia Hypophosphatemia Hypokalemia -Continue to monitor closely, and replete as needed.   Alcoholic liver cirrhosis  variceal bleed /portal gastropathy Thrombocytopenia, mild - Post banding, TIPS - con't PPI Q12h - no need for BB from EV ppx standpoint - Repeat surveillance EGD in 3 weeks, TIPS duplex ordered by GI - transfuse for Hb <7 or hemodynamically significant bleeding - -completed empiric ceftriaxone -appreciate GI's management -decrease to lactulose 60mg  TID, needs 2-3 BM's per day. Rifaximin when taking PO -Will add propranolol given elevated blood pressure today and history of esophageal varices -Consider increasing his Aldactone over the next 24 hours if he is tolerating low-dose oral Lasix today     Hx Systolic congestive heart failure (EF 50-55% with WMA 02/04/23), HoTN CAD, HTN, HLD P: -PTA midodrine -lasix -monitor on tele -resume PTA spironolactone -minimize IVF   Hx adrenal insufficiency  -continue home hydrocort   Acute metabolic encephalopathy, hepatic encephalopathy Hx polysubstance abuse including EtOH improving - vitamins - home seroquel 25 mg qhs -Patient currently off Precedex -He is somnolent, but wakes up, interactive today -Continue with low-dose Librium for now.      DVT prophylaxis: Lovenox Code Status: Full Family Communication: none at bedside Disposition:   Status is: Inpatient    Consultants:  PCCM GI   Subjective:  No significant events overnight other than diarrhea, he  Objective: Vitals:   04/19/23 0400 04/19/23 0814 04/19/23 0940 04/19/23 1214  BP: (!) 117/57 (!) 111/59  115/68  Pulse:  82 86 88 90  Resp: 19 18 20    Temp: 98.9 F (37.2 C)   99 F (37.2 C)  TempSrc: Oral   Oral  SpO2: 92% 96% 95%   Weight: 84.9 kg     Height:        Intake/Output  Summary (Last 24 hours) at 04/19/2023 1331 Last data filed at 04/19/2023 1217 Gross per 24 hour  Intake 10 ml  Output 2300 ml  Net -2290 ml   Filed Weights   04/15/23 0331 04/16/23 0429 04/19/23 0400  Weight: 87.4 kg 85.1 kg 84.9 kg    Examination:  He is more awake and appropriate today, deconditioned, chronically appearing, core Trak tube present Symmetrical Chest wall movement, Good air movement bilaterally, CTAB RRR,No Gallops,Rubs or new Murmurs, No Parasternal Heave +ve B.Sounds, Abd Soft, No tenderness, No rebound - guarding or rigidity. No Cyanosis, Clubbing or edema, No new Rash or bruise      Data Reviewed: I have personally reviewed following labs and imaging studies  CBC: Recent Labs  Lab 04/15/23 0453 04/16/23 0431 04/17/23 0935 04/18/23 0433 04/19/23 0330  WBC 9.3 7.8 10.5 13.9* 15.7*  NEUTROABS  --   --  7.1  --   --   HGB 9.8* 9.6* 9.3* 10.5* 10.0*  HCT 30.8* 31.3* 29.1* 33.0* 31.2*  MCV 91.7 94.3 91.8 90.9 91.5  PLT 106* 114* 118* 142* 137*    Basic Metabolic Panel: Recent Labs  Lab 04/15/23 0453 04/15/23 1906 04/16/23 0431 04/16/23 1611 04/17/23 0502 04/18/23 0433 04/19/23 0330  NA 144  --  150*  --  143 141 137  K 3.1*  --  2.8* 3.0* 3.0* 3.9 3.9  CL 110  --  117*  --  107 113* 113*  CO2 28  --  26  --  24 20* 18*  GLUCOSE 158*  --  175*  --  133* 139* 136*  BUN 10  --  7  --  8 6 7   CREATININE 0.68  --  0.61  --  0.64 0.57* 0.61  CALCIUM 8.0*  --  8.6*  --  7.9* 8.4* 8.0*  MG 1.8  --  2.1 2.0 1.7 2.0 2.0  PHOS 2.3*   < > 1.5* 3.4 1.9* 2.3* 2.4*   < > = values in this interval not displayed.    GFR: Estimated Creatinine Clearance: 110.8 mL/min (by C-G formula based on SCr of 0.61 mg/dL).  Liver Function Tests: Recent Labs  Lab 04/17/23 0502  AST 69*  ALT 44  ALKPHOS 181*  BILITOT 2.6*  PROT 5.8*  ALBUMIN 2.2*    CBG: Recent Labs  Lab 04/18/23 2003 04/18/23 2316 04/19/23 0346 04/19/23 0813 04/19/23 1153  GLUCAP 157*  136* 130* 155* 158*     Recent Results (from the past 240 hour(s))  Culture, Respiratory w Gram Stain     Status: None   Collection Time: 04/13/23 11:35 AM   Specimen: Tracheal Aspirate; Respiratory  Result Value Ref Range Status   Specimen Description TRACHEAL ASPIRATE  Final   Special Requests NONE  Final   Gram Stain   Final    FEW WBC PRESENT, PREDOMINANTLY PMN NO ORGANISMS SEEN    Culture   Final    RARE Normal respiratory flora-no Staph aureus or Pseudomonas seen Performed at [facility], [address] [place], [place] [address]    Report Status 04/15/2023 FINAL  Final         Radiology Studies: No  results found.      Scheduled Meds:  arformoterol  15 mcg Nebulization BID   chlordiazePOXIDE  5 mg Oral TID   Chlorhexidine Gluconate Cloth  6 each Topical Daily   enoxaparin (LOVENOX) injection  40 mg Subcutaneous Daily   feeding supplement (PROSource TF20)  60 mL Per Tube Daily   folic acid  1 mg Per Tube Daily   free water  200 mL Per Tube Q4H   gabapentin  300 mg Per Tube Daily   Gerhardt's butt cream   Topical BID   hydrocortisone  10 mg Per Tube Daily   hydrocortisone  5 mg Per Tube Daily   insulin aspart  0-15 Units Subcutaneous Q4H   lactulose  10 g Per Tube TID   midodrine  5 mg Per Tube Q8H   multivitamin with minerals  1 tablet Per Tube Daily   pantoprazole  40 mg Intravenous Q12H   phosphorus  500 mg Per Tube TID   potassium chloride  20 mEq Per Tube BID   propranolol  10 mg Per Tube TID   QUEtiapine  75 mg Per Tube QHS   revefenacin  175 mcg Nebulization Daily   rosuvastatin  40 mg Per Tube Daily   sodium chloride flush  10-40 mL Intracatheter Q12H   spironolactone  12.5 mg Per Tube Daily   thiamine  100 mg Per Tube Daily   Continuous Infusions:  sodium chloride Stopped (04/17/23 0000)   feeding supplement (VITAL 1.5 CAL) 1,000 mL (04/18/23 1248)     LOS: 12 days      Huey Bienenstock, MD [facility]   To contact  the attending provider between 7A-7P or the covering provider during after hours 7P-7A, please log into the web site [email or web address] and access using universal  password for that web site. If you do not have the password, please call the hospital operator.  04/19/2023, 1:31 PM

## 2023-04-20 LAB — CBC
HCT: 35.7 % — ABNORMAL LOW (ref 39.0–52.0)
Hemoglobin: 11.1 g/dL — ABNORMAL LOW (ref 13.0–17.0)
MCH: 28.6 pg (ref 26.0–34.0)
MCHC: 31.1 g/dL (ref 30.0–36.0)
MCV: 92 fL (ref 80.0–100.0)
Platelets: 167 10*3/uL (ref 150–400)
RBC: 3.88 MIL/uL — ABNORMAL LOW (ref 4.22–5.81)
RDW: 19.4 % — ABNORMAL HIGH (ref 11.5–15.5)
WBC: 19 10*3/uL — ABNORMAL HIGH (ref 4.0–10.5)
nRBC: 0 % (ref 0.0–0.2)

## 2023-04-20 LAB — BASIC METABOLIC PANEL
Anion gap: 7 (ref 5–15)
BUN: 10 mg/dL (ref 6–20)
CO2: 17 mmol/L — ABNORMAL LOW (ref 22–32)
Calcium: 8.4 mg/dL — ABNORMAL LOW (ref 8.9–10.3)
Chloride: 111 mmol/L (ref 98–111)
Creatinine, Ser: 0.65 mg/dL (ref 0.61–1.24)
GFR, Estimated: >60 mL/min (ref >60)
Glucose, Bld: 144 mg/dL — ABNORMAL HIGH (ref 70–99)
Potassium: 3.8 mmol/L (ref 3.5–5.1)
Sodium: 135 mmol/L (ref 135–145)

## 2023-04-20 LAB — GLUCOSE, CAPILLARY
Glucose-Capillary: 128 mg/dL — ABNORMAL HIGH (ref 70–99)
Glucose-Capillary: 151 mg/dL — ABNORMAL HIGH (ref 70–99)
Glucose-Capillary: 139 mg/dL — ABNORMAL HIGH (ref 70–99)
Glucose-Capillary: 160 mg/dL — ABNORMAL HIGH (ref 70–99)
Glucose-Capillary: 154 mg/dL — ABNORMAL HIGH (ref 70–99)

## 2023-04-20 LAB — PHOSPHORUS: Phosphorus: 2.5 mg/dL (ref 2.5–4.6)

## 2023-04-20 LAB — MAGNESIUM: Magnesium: 1.9 mg/dL (ref 1.7–2.4)

## 2023-04-20 LAB — AMMONIA: Ammonia: 62 umol/L — ABNORMAL HIGH (ref 9–35)

## 2023-04-20 MED ORDER — CHLORDIAZEPOXIDE HCL 5 MG PO CAPS
10.0000 mg | ORAL_CAPSULE | Freq: Three times a day (TID) | ORAL | Status: AC
  Administered 2023-04-20 – 2023-04-22 (×6): 10 mg via ORAL
  Filled 2023-04-20 (×6): qty 2

## 2023-04-20 MED ORDER — RIFAXIMIN 550 MG PO TABS
550.0000 mg | ORAL_TABLET | Freq: Two times a day (BID) | ORAL | Status: DC
  Administered 2023-04-20 – 2023-04-25 (×11): 550 mg
  Filled 2023-04-20 (×11): qty 1

## 2023-04-20 NOTE — Progress Notes (Signed)
PROGRESS NOTE    Ethan Wells  NFA:[id number] DOB: 02-21-1971 DOA: 04/07/2023 PCP: [name], NP    No chief complaint on file.   Brief Narrative:   Ethan Wells is a 52 yo male with a PMH significant for recurrent GI bleed with known variceal bleed, ischemic cardiomyopathy, EtOH abuse, polysubstance abuse, HTN, HLD, depression, and anxiety who presented to [facility] ED 4/22 for acute GI bleed. Of note patient was seen within this health sytem 1 month ago for significant GI bleed requiring banding per GI with TIPS procedure recommended but patient declined. Patient reports last drink and cocaine use was day prior to admission.   On ED arrival patient was seen hypotensive and mildly tachycardic. Lab work significant for K 2.3, glucoses 140, Mg 1.2, alkaline phos 153, albumin 2.3, AST 44, total protein 5.6, total bili 1.6, lactic 5.6, hgb 7.1, and hct 23.2. Given active bleed with compromised hemodynamics, PCCM consulted for further management and admission.   Significant events during hospital stay  4/22 admitted with acute GI bleed  4/23 EGD >> X esophagus, grade 2 varices, banded x 2, portal gastropathy 4/23 intubated for flash pulm edema , started on Lasix and proned 4/25 extubated 4/27 TIPS, re intubated for procedure. Did not meet extubation criteria and was returned to ICU on vent.  4/29 remains intubated, mental status precluding extubation 4/30 NGT placed, extubated 5/2 [facility]    Assessment & Plan:   Principal Problem:   GI bleed Active Problems:   Anemia due to GI blood loss   Acute hypoxic respiratory failure (HCC)   Secondary esophageal varices with bleeding (HCC)   Esophageal varices with bleeding in diseases classified elsewhere (HCC)   Hepatic encephalopathy (HCC)   Alcoholic cirrhosis (HCC)  Acute respiratory failure with hypoxia, resolving - Possible transfused ARDS. Extubated 4/25, but reintubated  4/27 for TIPS. Returned from IR intubated, extubated 4/30.  -PT/mobilize -pulmonary hygiene - encouraged to use spirometry and flutter valve.   Hypernatremia Hypophosphatemia Hypokalemia -Continue to monitor closely, and replete as needed.   Alcoholic liver cirrhosis  variceal bleed /portal gastropathy Thrombocytopenia, mild - Post banding, TIPS - con't PPI Q12h - no need for BB from EV ppx standpoint - Repeat surveillance EGD in 3 weeks, TIPS duplex ordered by GI - transfuse for Hb <7 or hemodynamically significant bleeding - -completed empiric ceftriaxone -appreciate GI's management -Continue with lactulose, adjust dosing as needed, -Will start on rifaximin      Hx Systolic congestive heart failure (EF 50-55% with WMA 02/04/23), HoTN CAD, HTN, HLD P: -PTA midodrine -lasix -monitor on tele -resume PTA spironolactone -minimize IVF   Hx adrenal insufficiency  -continue home hydrocort   Acute metabolic encephalopathy, hepatic encephalopathy Hx polysubstance abuse including EtOH improving - vitamins -Patient currently off Precedex -Is more agitated today, will increase his Librium, has increased his oral bedtime as well.      DVT prophylaxis: Lovenox Code Status: Full Family Communication: none at bedside Disposition:   Status is: Inpatient    Consultants:  PCCM GI   Subjective:  No significant events overnight other than diarrhea, he  Objective: Vitals:   04/20/23 0430 04/20/23 0432 04/20/23 0748 04/20/23 0800  BP:  125/60  (!) 121/59  Pulse:  88  91  Resp:  20  16  Temp:  98.9 F (37.2 C)  98.2 F (36.8 C)  TempSrc:  Oral  Oral  SpO2:   95% 97%  Weight: 84.3 kg  Height:        Intake/Output Summary (Last 24 hours) at 04/20/2023 1129 Last data filed at 04/20/2023 1610 Gross per 24 hour  Intake 2406.17 ml  Output 5000 ml  Net -2593.83 ml   Filed Weights   04/16/23 0429 04/19/23 0400 04/20/23 0430  Weight: 85.1 kg 84.9 kg 84.3 kg     Examination:  He is more awake and  conversant today, deconditioned, chronically appearing, core Trak tube present Symmetrical Chest wall movement, Good air movement bilaterally, CTAB RRR,No Gallops,Rubs or new Murmurs, No Parasternal Heave +ve B.Sounds, Abd Soft, No tenderness, No rebound - guarding or rigidity. No Cyanosis, Clubbing or edema, No new Rash or bruise      Data Reviewed: I have personally reviewed following labs and imaging studies  CBC: Recent Labs  Lab 04/15/23 0453 04/16/23 0431 04/17/23 0935 04/18/23 0433 04/19/23 0330  WBC 9.3 7.8 10.5 13.9* 15.7*  NEUTROABS  --   --  7.1  --   --   HGB 9.8* 9.6* 9.3* 10.5* 10.0*  HCT 30.8* 31.3* 29.1* 33.0* 31.2*  MCV 91.7 94.3 91.8 90.9 91.5  PLT 106* 114* 118* 142* 137*    Basic Metabolic Panel: Recent Labs  Lab 04/15/23 0453 04/15/23 1906 04/16/23 0431 04/16/23 1611 04/17/23 0502 04/18/23 0433 04/19/23 0330  NA 144  --  150*  --  143 141 137  K 3.1*  --  2.8* 3.0* 3.0* 3.9 3.9  CL 110  --  117*  --  107 113* 113*  CO2 28  --  26  --  24 20* 18*  GLUCOSE 158*  --  175*  --  133* 139* 136*  BUN 10  --  7  --  8 6 7   CREATININE 0.68  --  0.61  --  0.64 0.57* 0.61  CALCIUM 8.0*  --  8.6*  --  7.9* 8.4* 8.0*  MG 1.8  --  2.1 2.0 1.7 2.0 2.0  PHOS 2.3*   < > 1.5* 3.4 1.9* 2.3* 2.4*   < > = values in this interval not displayed.    GFR: Estimated Creatinine Clearance: 110.3 mL/min (by C-G formula based on SCr of 0.61 mg/dL).  Liver Function Tests: Recent Labs  Lab 04/17/23 0502  AST 69*  ALT 44  ALKPHOS 181*  BILITOT 2.6*  PROT 5.8*  ALBUMIN 2.2*    CBG: Recent Labs  Lab 04/19/23 1557 04/19/23 2034 04/19/23 2355 04/20/23 0430 04/20/23 0737  GLUCAP 177* 129* 125* 128* 151*     Recent Results (from the past 240 hour(s))  Culture, Respiratory w Gram Stain     Status: None   Collection Time: 04/13/23 11:35 AM   Specimen: Tracheal Aspirate; Respiratory  Result Value Ref Range Status    Specimen Description TRACHEAL ASPIRATE  Final   Special Requests NONE  Final   Gram Stain   Final    FEW WBC PRESENT, PREDOMINANTLY PMN NO ORGANISMS SEEN    Culture   Final    RARE Normal respiratory flora-no Staph aureus or Pseudomonas seen Performed at [facility], [address], [place], [place] [address]    Report Status 04/15/2023 FINAL  Final         Radiology Studies: No results found.      Scheduled Meds:  arformoterol  15 mcg Nebulization BID   chlordiazePOXIDE  10 mg Oral TID   Chlorhexidine Gluconate Cloth  6 each Topical Daily   enoxaparin (LOVENOX) injection  40  mg Subcutaneous Daily   feeding supplement (PROSource TF20)  60 mL Per Tube Daily   folic acid  1 mg Per Tube Daily   free water  200 mL Per Tube Q4H   furosemide  20 mg Oral Daily   gabapentin  300 mg Per Tube Daily   Gerhardt's butt cream   Topical BID   hydrocortisone  10 mg Per Tube Daily   hydrocortisone  5 mg Per Tube Daily   insulin aspart  0-15 Units Subcutaneous Q4H   lactulose  10 g Per Tube TID   midodrine  5 mg Per Tube Q8H   multivitamin with minerals  1 tablet Per Tube Daily   pantoprazole  40 mg Intravenous Q12H   phosphorus  500 mg Per Tube TID   potassium chloride  20 mEq Per Tube BID   propranolol  10 mg Per Tube TID   QUEtiapine  75 mg Per Tube QHS   revefenacin  175 mcg Nebulization Daily   rosuvastatin  40 mg Per Tube Daily   sodium chloride flush  10-40 mL Intracatheter Q12H   spironolactone  12.5 mg Per Tube Daily   thiamine  100 mg Per Tube Daily   Continuous Infusions:  sodium chloride Stopped (04/17/23 0000)   feeding supplement (VITAL 1.5 CAL) 1,000 mL (04/20/23 0608)     LOS: 13 days      Huey Bienenstock, MD [facility]   To contact the attending provider between 7A-7P or the covering provider during after hours 7P-7A, please log into the web site [email or web address] and access using universal [place] password for that web site. If you  do not have the password, please call the hospital operator.  04/20/2023, 11:29 AM

## 2023-04-20 NOTE — Plan of Care (Signed)
  Problem: Education: Goal: Knowledge of General Education information will improve Description: Including pain rating scale, medication(s)/side effects and non-pharmacologic comfort measures Outcome: Progressing   Problem: Health Behavior/Discharge Planning: Goal: Ability to manage health-related needs will improve Outcome: Progressing   Problem: Clinical Measurements: Goal: Ability to maintain clinical measurements within normal limits will improve Outcome: Progressing Goal: Will remain free from infection Outcome: Progressing Goal: Diagnostic test results will improve Outcome: Progressing Goal: Respiratory complications will improve Outcome: Progressing Goal: Cardiovascular complication will be avoided Outcome: Progressing   Problem: Activity: Goal: Risk for activity intolerance will decrease Outcome: Progressing   Problem: Nutrition: Goal: Adequate nutrition will be maintained Outcome: Progressing   Problem: Coping: Goal: Level of anxiety will decrease Outcome: Progressing   Problem: Elimination: Goal: Will not experience complications related to bowel motility Outcome: Progressing Goal: Will not experience complications related to urinary retention Outcome: Progressing   Problem: Pain Managment: Goal: General experience of comfort will improve Outcome: Progressing   Problem: Safety: Goal: Ability to remain free from injury will improve Outcome: Progressing   Problem: Skin Integrity: Goal: Risk for impaired skin integrity will decrease Outcome: Progressing   Problem: Education: Goal: Ability to describe self-care measures that may prevent or decrease complications (Diabetes Survival Skills Education) will improve Outcome: Progressing Goal: Individualized Educational Video(s) Outcome: Progressing   Problem: Coping: Goal: Ability to adjust to condition or change in health will improve Outcome: Progressing   Problem: Fluid Volume: Goal: Ability to  maintain a balanced intake and output will improve Outcome: Progressing   Problem: Health Behavior/Discharge Planning: Goal: Ability to identify and utilize available resources and services will improve Outcome: Progressing Goal: Ability to manage health-related needs will improve Outcome: Progressing   Problem: Metabolic: Goal: Ability to maintain appropriate glucose levels will improve Outcome: Progressing   Problem: Nutritional: Goal: Maintenance of adequate nutrition will improve Outcome: Progressing Goal: Progress toward achieving an optimal weight will improve Outcome: Progressing   Problem: Skin Integrity: Goal: Risk for impaired skin integrity will decrease Outcome: Progressing   Problem: Tissue Perfusion: Goal: Adequacy of tissue perfusion will improve Outcome: Progressing   Problem: Education: Goal: Understanding of CV disease, CV risk reduction, and recovery process will improve Outcome: Progressing Goal: Individualized Educational Video(s) Outcome: Progressing   Problem: Activity: Goal: Ability to return to baseline activity level will improve Outcome: Progressing   Problem: Cardiovascular: Goal: Ability to achieve and maintain adequate cardiovascular perfusion will improve Outcome: Progressing Goal: Vascular access site(s) Level 0-1 will be maintained Outcome: Progressing   Problem: Health Behavior/Discharge Planning: Goal: Ability to safely manage health-related needs after discharge will improve Outcome: Progressing   Problem: Safety: Goal: Non-violent Restraint(s) Outcome: Progressing   Problem: Activity: Goal: Ability to tolerate increased activity will improve Outcome: Progressing   Problem: Respiratory: Goal: Ability to maintain a clear airway and adequate ventilation will improve Outcome: Progressing   Problem: Role Relationship: Goal: Method of communication will improve Outcome: Progressing   

## 2023-04-20 NOTE — Progress Notes (Signed)
Speech Language Pathology Treatment: Dysphagia  Patient Details Name: Ethan Wells MRN: [id number] DOB: March 29, 1971 Today's Date: 04/20/2023 Time: 0950-1008 SLP Time Calculation (min) (ACUTE ONLY): 18 min  Assessment / Plan / Recommendation Clinical Impression  ST follow up for skilled therapy for diagnostic treatment to assess for PO readiness vs need for instrumental exam. RN reported that patient seemed more alert today.  Patient was oriented to self and able to follow most commands.  Cranial nerve exam was completed and unremarkable.  Lingual, labial, facial and jaw range of motion and strength appeared to be adequate.  Facial sensation appeared to be intact and he did not endorse a difference in sensation between the right and left side of his face.  Vocal qualtiy remained aphonic leading to his speech being unintelligible at times.  He was presented with thin liquids via spoon and small straw sip and pureed material.   He appeared to be slow orally to clear material from his oral cavity and he was noted to swallow twice given presentation of pureed material.  Cough response was not seen on any trials.  Given prolonged intubation recommend he remain NPO except ice chips pending MBS that will be completed tomorrow.      HPI HPI: Patient is a 52 y.o. male with PMH: recurrent GI bleed with known variceal bleed, ischemic cardiomyopathy, ETOH abuse, polysubstance abuse, HTN, HLD, depression and anxiety. He presented to [facility] ED on 04/07/23 with acute GI bleed. He was admitted previous month for GI bleed and TIPS procedure recommended at that time but patient declined it. In ED, patient was hypotensive, mildly tachycardic. EGD completed 4/23. He was intubated 4/23 for flash pulmonary edema and extubated 4/25. He was reintubated on 4/27 for TIPS procedure but unable to be extubated following procedure. On 4/30 NG was placed and patient was extubated.  Pt has undergone BSE with recommendation for NPO -       SLP Plan  MBS      Recommendations for follow up therapy are one component of a multi-disciplinary discharge planning process, led by the attending physician.  Recommendations may be updated based on patient status, additional functional criteria and insurance authorization.    Recommendations  Diet recommendations: NPO (x ice chips) Liquids provided via: Teaspoon Medication Administration: Via alternative means Supervision: Trained caregiver to feed patient                  Oral care QID   Frequent or constant Supervision/Assistance Dysphagia, unspecified (R13.10)     MBS     Ethan Aguas, MA, CCC-SLP Acute Rehab SLP [phone]  Ethan Wells  04/20/2023, 10:17 AM

## 2023-04-21 ENCOUNTER — Inpatient Hospital Stay (HOSPITAL_COMMUNITY): Payer: Medicare HMO

## 2023-04-21 LAB — GLUCOSE, CAPILLARY
Glucose-Capillary: 131 mg/dL — ABNORMAL HIGH (ref 70–99)
Glucose-Capillary: 136 mg/dL — ABNORMAL HIGH (ref 70–99)
Glucose-Capillary: 143 mg/dL — ABNORMAL HIGH (ref 70–99)
Glucose-Capillary: 152 mg/dL — ABNORMAL HIGH (ref 70–99)
Glucose-Capillary: 206 mg/dL — ABNORMAL HIGH (ref 70–99)
Glucose-Capillary: 210 mg/dL — ABNORMAL HIGH (ref 70–99)
Glucose-Capillary: 218 mg/dL — ABNORMAL HIGH (ref 70–99)

## 2023-04-21 MED ORDER — LACTULOSE 10 GM/15ML PO SOLN
20.0000 g | Freq: Three times a day (TID) | ORAL | Status: DC
  Administered 2023-04-21 – 2023-04-25 (×11): 20 g
  Filled 2023-04-21 (×13): qty 30

## 2023-04-21 MED ORDER — ENSURE ENLIVE PO LIQD
237.0000 mL | Freq: Two times a day (BID) | ORAL | Status: DC
  Administered 2023-04-21 – 2023-04-24 (×4): 237 mL via ORAL

## 2023-04-21 MED ORDER — FREE WATER
100.0000 mL | Status: DC
  Administered 2023-04-21 – 2023-04-26 (×28): 100 mL

## 2023-04-21 NOTE — Progress Notes (Signed)
Nutrition Follow-up  DOCUMENTATION CODES:   Not applicable  INTERVENTION:   Tube feeds via Cortrak: Vital 1.5 at 55 mL/hr (1320 mL per day) 60 mL ProSource TF20 - daily Free water flush: 100 mL q4h Provides 2060 kcal, 109 gm protein, and 1608 mL free water daily.  Continue Folic acid, Thiamine, and Multivitamin w/ minerals daily Ensure Enlive po BID, each supplement provides 350 kcal and 20 grams of protein.  NUTRITION DIAGNOSIS:   Inadequate oral intake related to inability to eat as evidenced by NPO status. - Progressing, diet now advanced to regular  GOAL:   Patient will meet greater than or equal to 90% of their needs - Being addressed via TF  MONITOR:   PO intake, Supplement acceptance, Labs, I & O's  REASON FOR ASSESSMENT:   Ventilator    ASSESSMENT:   52 y.o. male presented to [facility] ED with acute GI bleed. Pt recently admitted for GI bleed one month prior. PMH includes HTN, CAD, EtOH abuse, polysubstance abuse, CHD, alcohol cirrhosis w/ esophageal varices, and HLD.  04/22 - admitted 04/23 - intubated 04/25 - extubated, diet advanced to clear liquids 04/26 - NPO s/p TIPS, remained intubated post-procedure 04/30 - extubated, NG tube placed 05/01 - pt pulled NG tube, Cortrak placed (tip at pylorus or proximal duodenum), SLP recommending NPO; transferred to floor 05/06 - MBS; diet advanced to regular, thin liquids  Pt sleeping at time of RD visit. Spoke with RN outside of room. RN shares that pt has been in and out of it and slightly lethargic. Discussed with team about continuing with tube feeds until pt can consistently consume >/= 60% of his estimated needs.  MD requesting to decrease FWF due to low sodium level.  Medications reviewed and include: Folic Acid, Lasix, NovoLog SSI, Lactulose, MVI, Protonix, Potassium Chloride, Rifaximin, Spironolactone, Thiamine Labs reviewed. CBGs: 131-152 x 24 hours  UOP: 3400 mL x 24 hours  Diet Order:   Diet Order              Diet regular Room service appropriate? Yes with Assist; Fluid consistency: Thin  Diet effective now                   EDUCATION NEEDS:   Not appropriate for education at this time  Skin:  Skin Assessment: Reviewed RN Assessment (scattered closed incisions)  Last BM:  5/4  Height:   Ht Readings from Last 1 Encounters:  04/13/23 5\' 5"  (1.651 m)    Weight:   Wt Readings from Last 1 Encounters:  04/21/23 83.2 kg    Ideal Body Weight:  61.8 kg  BMI:  Body mass index is 30.52 kg/m.  Estimated Nutritional Needs:  Kcal:  2000-2200 Protein:  100-120 grams Fluid:  >/= 2 L   Kirby Crigler RD, LDN Clinical Dietitian See [facility] for contact information.

## 2023-04-21 NOTE — Progress Notes (Signed)
PROGRESS NOTE    Ethan Wells  ZOX:[id number] DOB: [birth year] DOA: 04/07/2023 PCP: [name], NP    No chief complaint on file.   Brief Narrative:   Ethan Wells is a 52 yo male with a PMH significant for recurrent GI bleed with known variceal bleed, ischemic cardiomyopathy, EtOH abuse, polysubstance abuse, HTN, HLD, depression, and anxiety who presented to [facility] ED 4/22 for acute GI bleed. Of note patient was seen within this health sytem 1 month ago for significant GI bleed requiring banding per GI with TIPS procedure recommended but patient declined. Patient reports last drink and cocaine use was day prior to admission.   On ED arrival patient was seen hypotensive and mildly tachycardic. Lab work significant for K 2.3, glucoses 140, Mg 1.2, alkaline phos 153, albumin 2.3, AST 44, total protein 5.6, total bili 1.6, lactic 5.6, hgb 7.1, and hct 23.2. Given active bleed with compromised hemodynamics, PCCM consulted for further management and admission.   Significant events during hospital stay  4/22 admitted with acute GI bleed  4/23 EGD >> X esophagus, grade 2 varices, banded x 2, portal gastropathy 4/23 intubated for flash pulm edema , started on Lasix and proned 4/25 extubated 4/27 TIPS, re intubated for procedure. Did not meet extubation criteria and was returned to ICU on vent.  4/29 remains intubated, mental status precluding extubation 4/30 NGT placed, extubated 5/2 [facility] 5[facility]>> did MBS evaluation, started on regular diet    Assessment & Plan:   Principal Problem:   GI bleed Active Problems:   Anemia due to GI blood loss   Acute hypoxic respiratory failure (HCC)   Secondary esophageal varices with bleeding (HCC)   Esophageal varices with bleeding in diseases classified elsewhere (HCC)   Hepatic encephalopathy (HCC)   Alcoholic cirrhosis (HCC)  Acute respiratory failure with hypoxia, resolving -  Possible transfused ARDS. Extubated 4/25, but reintubated 4/27 for TIPS. Returned from IR intubated, extubated 4/30.  -PT/mobilize -pulmonary hygiene - encouraged to use spirometry and flutter valve.   Hypernatremia Hypophosphatemia Hypokalemia -Continue to monitor closely, and replete as needed.   Alcoholic liver cirrhosis  variceal bleed /portal gastropathy Thrombocytopenia, mild - Post banding, TIPS - con't PPI Q12h - no need for BB from EV ppx standpoint - Repeat surveillance EGD in 3 weeks, TIPS duplex ordered by GI - transfuse for Hb <7 or hemodynamically significant bleeding - -completed empiric ceftriaxone -appreciate GI's management -Continue with lactulose, Monia level is elevated, will increase lactulose level -Started on rifaximin      Hx Systolic congestive heart failure (EF 50-55% with WMA 02/04/23), HoTN CAD, HTN, HLD P: -PTA midodrine -lasix -monitor on tele -resume PTA spironolactone -minimize IVF   Hx adrenal insufficiency  -continue home hydrocort   Acute metabolic encephalopathy, hepatic encephalopathy Hx polysubstance abuse including EtOH improving - vitamins -Patient currently off Precedex -Started on regular diet today.      DVT prophylaxis: Lovenox Code Status: Full Family Communication: none at bedside Disposition:   Status is: Inpatient    Consultants:  PCCM GI   Subjective:  No significant events overnight other than diarrhea, he  Objective: Vitals:   04/21/23 0017 04/21/23 0430 04/21/23 0446 04/21/23 1008  BP: 120/62 122/66  127/73  Pulse: 87 87  86  Resp: 17 17  19   Temp: 99.5 F (37.5 C) 100 F (37.8 C)  98.8 F (37.1 C)  TempSrc: Oral Oral    SpO2: 99% 98%  97%  Weight:   83.2 kg   Height:        Intake/Output Summary (Last 24 hours) at 04/21/2023 1126 Last data filed at 04/21/2023 0444 Gross per 24 hour  Intake 680 ml  Output 2800 ml  Net -2120 ml   Filed Weights   04/19/23 0400 04/20/23 0430 04/21/23  0446  Weight: 84.9 kg 84.3 kg 83.2 kg    Examination:  Awake Alert, more  appropriate today, core Trak tube present, extremely frail, deconditioned and chronically ill-appearing Symmetrical Chest wall movement, Good air movement bilaterally, CTAB RRR,No Gallops,Rubs or new Murmurs, No Parasternal Heave +ve B.Sounds, Abd Soft, No tenderness, No rebound - guarding or rigidity. No Cyanosis, Clubbing or edema, No new Rash or bruise      Data Reviewed: I have personally reviewed following labs and imaging studies  CBC: Recent Labs  Lab 04/16/23 0431 04/17/23 0935 04/18/23 0433 04/19/23 0330 04/20/23 1423  WBC 7.8 10.5 13.9* 15.7* 19.0*  NEUTROABS  --  7.1  --   --   --   HGB 9.6* 9.3* 10.5* 10.0* 11.1*  HCT 31.3* 29.1* 33.0* 31.2* 35.7*  MCV 94.3 91.8 90.9 91.5 92.0  PLT 114* 118* 142* 137* 167    Basic Metabolic Panel: Recent Labs  Lab 04/16/23 0431 04/16/23 1611 04/17/23 0502 04/18/23 0433 04/19/23 0330 04/20/23 1423  NA 150*  --  143 141 137 135  K 2.8* 3.0* 3.0* 3.9 3.9 3.8  CL 117*  --  107 113* 113* 111  CO2 26  --  24 20* 18* 17*  GLUCOSE 175*  --  133* 139* 136* 144*  BUN 7  --  8 6 7 10   CREATININE 0.61  --  0.64 0.57* 0.61 0.65  CALCIUM 8.6*  --  7.9* 8.4* 8.0* 8.4*  MG 2.1 2.0 1.7 2.0 2.0 1.9  PHOS 1.5* 3.4 1.9* 2.3* 2.4* 2.5    GFR: Estimated Creatinine Clearance: 109.7 mL/min (by C-G formula based on SCr of 0.65 mg/dL).  Liver Function Tests: Recent Labs  Lab 04/17/23 0502  AST 69*  ALT 44  ALKPHOS 181*  BILITOT 2.6*  PROT 5.8*  ALBUMIN 2.2*    CBG: Recent Labs  Lab 04/20/23 1604 04/20/23 2052 04/21/23 0017 04/21/23 0434 04/21/23 1007  GLUCAP 160* 154* 143* 136* 152*     Recent Results (from the past 240 hour(s))  Culture, Respiratory w Gram Stain     Status: None   Collection Time: 04/13/23 11:35 AM   Specimen: Tracheal Aspirate; Respiratory  Result Value Ref Range Status   Specimen Description TRACHEAL ASPIRATE  Final    Special Requests NONE  Final   Gram Stain   Final    FEW WBC PRESENT, PREDOMINANTLY PMN NO ORGANISMS SEEN    Culture   Final    RARE Normal respiratory flora-no Staph aureus or Pseudomonas seen Performed at [facility], [address], [place], [place] [address]    Report Status 04/15/2023 FINAL  Final         Radiology Studies: DG Swallowing Func-Speech Pathology  Result Date: 04/21/2023 Table formatting from the original result was not included. Modified Barium Swallow Study Patient Details Name: ROC TEEM MRN: [id number] Date of Birth: 1971-11-06 Today's Date: 04/21/2023 HPI/PMH: HPI: Patient is a 52 y.o. male with PMH: recurrent GI bleed with known variceal bleed, ischemic cardiomyopathy, ETOH abuse, polysubstance abuse, HTN, HLD, depression and anxiety. He presented to [facility] ED on 04/07/23 with acute GI bleed. He was admitted  previous month for GI bleed and TIPS procedure recommended at that time but patient declined it. In ED, patient was hypotensive, mildly tachycardic. EGD completed 4/23. He was intubated 4/23 for flash pulmonary edema and extubated 4/25. He was reintubated on 4/27 for TIPS procedure but unable to be extubated following procedure. On 4/30 NG was placed and patient was extubated.  Pt has undergone BSE with recommendation for NPO - Clinical Impression: Clinical Impression: Pt presented with functional oropharyngeal swallow with adequate oral control of bolus, reliable laryngeal vestibule closure with no penetration/aspiration, normal pharyngeal stripping with no residue post-swallow, and normal ability to manage swallowing a pill with thin liquid with no aspiration and  transfer of pill through esophagus. Recommend starting a regular diet, thin liquids; meds in liquid. Mental status will be the primary barrier to adequate PO intake. SLP will f/u x1 to ensure functionality during mealtime. D/W RD, RN, MD. Factors that may increase risk of adverse event in presence  of aspiration Rubye Oaks & Clearance Coots 2021): Factors that may increase risk of adverse event in presence of aspiration Rubye Oaks & Clearance Coots 2021): Reduced cognitive function Recommendations/Plan: Swallowing Evaluation Recommendations Swallowing Evaluation Recommendations Recommendations: PO diet PO Diet Recommendation: Regular; Thin liquids (Level 0) Liquid Administration via: Cup; Straw Medication Administration: Whole meds with liquid Supervision: Patient able to self-feed; Set-up assistance for safety Oral care recommendations: Oral care BID (2x/day) Treatment Plan Treatment Plan Treatment recommendations: Therapy as outlined in treatment plan below Follow-up recommendations: No SLP follow up Treatment frequency: Min 1x/week Treatment duration: 1 week Interventions: Diet toleration management by SLP Recommendations Recommendations for follow up therapy are one component of a multi-disciplinary discharge planning process, led by the attending physician.  Recommendations may be updated based on patient status, additional functional criteria and insurance authorization. Assessment: Orofacial Exam: Orofacial Exam Oral Cavity - Dentition: Poor condition; Missing dentition Orofacial Anatomy: WFL Oral Motor/Sensory Function: WFL Anatomy: Anatomy: WFL Boluses Administered: Boluses Administered Boluses Administered: Thin liquids (Level 0); Mildly thick liquids (Level 2, nectar thick); Puree; Solid  Oral Impairment Domain: Oral Impairment Domain Lip Closure: No labial escape Tongue control during bolus hold: Escape to lateral buccal cavity/floor of mouth Bolus preparation/mastication: Timely and efficient chewing and mashing Bolus transport/lingual motion: Brisk tongue motion Oral residue: Trace residue lining oral structures Location of oral residue : Tongue Initiation of pharyngeal swallow : Pyriform sinuses  Pharyngeal Impairment Domain: Pharyngeal Impairment Domain Soft palate elevation: No bolus between soft palate  (SP)/pharyngeal wall (PW) Laryngeal elevation: Complete superior movement of thyroid cartilage with complete approximation of arytenoids to epiglottic petiole Anterior hyoid excursion: Complete anterior movement Epiglottic movement: Complete inversion Laryngeal vestibule closure: Complete, no air/contrast in laryngeal vestibule Pharyngeal stripping wave : Present - complete Pharyngeal contraction (A/P view only): N/A Pharyngoesophageal segment opening: Complete distension and complete duration, no obstruction of flow Tongue base retraction: No contrast between tongue base and posterior pharyngeal wall (PPW) Pharyngeal residue: Complete pharyngeal clearance Location of pharyngeal residue: N/A  Esophageal Impairment Domain: Esophageal Impairment Domain Esophageal clearance upright position: Complete clearance, esophageal coating Pill: Esophageal Impairment Domain Esophageal clearance upright position: Complete clearance, esophageal coating Penetration/Aspiration Scale Score: Penetration/Aspiration Scale Score 1.  Material does not enter airway: Thin liquids (Level 0); Mildly thick liquids (Level 2, nectar thick); Puree; Solid; Pill Compensatory Strategies: Compensatory Strategies Compensatory strategies: Yes Straw: Effective   General Information: Caregiver present: No  Diet Prior to this Study: NPO   Temperature : Febrile   Respiratory Status: WFL   Supplemental O2: None (Room  air)   History of Recent Intubation: Yes  Behavior/Cognition: Alert; Cooperative; Requires cueing Self-Feeding Abilities: Able to self-feed Baseline vocal quality/speech: Hypophonia/low volume Volitional Cough: Able to elicit Volitional Swallow: Able to elicit Exam Limitations: Limited visibility; Excessive movement Goal Planning: Prognosis for improved oropharyngeal function: Good No data recorded No data recorded Patient/Family Stated Goal: HOB reclined No data recorded Pain: Pain Assessment Pain Assessment: No/denies pain Pain Score: 8 Faces  Pain Scale: 4 Breathing: 0 Negative Vocalization: 0 Facial Expression: 0 Body Language: 0 Consolability: 0 PAINAD Score: 0 Facial Expression: 0 Body Movements: 0 Muscle Tension: 0 Compliance with ventilator (intubated pts.): N/A Vocalization (extubated pts.): 0 CPOT Total: 0 Pain Location: abdomen Pain Descriptors / Indicators: Aching; Grimacing; Guarding Pain Intervention(s): Limited activity within patient's tolerance; Monitored during session; Repositioned End of Session: Start Time:SLP Start Time (ACUTE ONLY): 9604 Stop Time: SLP Stop Time (ACUTE ONLY): 5409 Time Calculation:SLP Time Calculation (min) (ACUTE ONLY): 18 min Charges: SLP Evaluations $ SLP Speech Visit: 1 Visit SLP Evaluations $BSS Swallow: 1 Procedure $MBS Swallow: 1 Procedure $Swallowing Treatment: 1 Procedure SLP visit diagnosis: SLP Visit Diagnosis: Dysphagia, unspecified (R13.10) Past Medical History: Past Medical History: Diagnosis Date  CAD (coronary artery disease)   a. 01/2011 Anterior STEMI/Cath/PCI: LM nl, LAD 100d (3.5x42mm Vision BMS placed), LCX 68m, RI 50, RCA min irregs, EF 40% w/ apical, inferoapical HK; b. 10/2022 NSTSEMI/Cath: LM nl, LAD 5p/m, RI 85, LCX 135m, OM1 mild dzs, OM3 100 fills via L->L collats from dLAD, RCA 80p, 194m, RPDA fills via collats from LAD. EF 45-50%-->Med Rx.  Cardiac arrest - ventricular fibrillation   a. In setting of STEMI 01/2011  Chronic pain   Cocaine abuse (HCC)   Depression with anxiety   ETOH abuse   a. 6-12 beers/day  GERD (gastroesophageal reflux disease)   GI bleed   Hemorrhoids   Hyperlipidemia   Hypertension   Ischemic cardiomyopathy   a. 06/2011 Echo: EF 45-50%, No rwma; b. 10/2022 Echo: EF 55-60%, no rwma, nl RV fxn.  Marijuana abuse   Migraines   Tobacco abuse   a. 1/2 ppd x 26 yrs Past Surgical History: Past Surgical History: Procedure Laterality Date  COLONOSCOPY  08/09/2021  Procedure: COLONOSCOPY;  Surgeon: Midge Minium, MD;  Location: [facility] ENDOSCOPY;  Service: Endoscopy;;  CORONARY  ANGIOPLASTY WITH STENT PLACEMENT    ESOPHAGEAL BANDING  04/08/2023  Procedure: ESOPHAGEAL BANDING;  Surgeon: Tressia Danas, MD;  Location: [facility] ENDOSCOPY;  Service: Gastroenterology;;  ESOPHAGOGASTRODUODENOSCOPY N/A 05/30/2022  Procedure: ESOPHAGOGASTRODUODENOSCOPY (EGD);  Surgeon: Toney Reil, MD;  Location: [facility] ENDOSCOPY;  Service: Gastroenterology;  Laterality: N/A;  ESOPHAGOGASTRODUODENOSCOPY (EGD) WITH PROPOFOL N/A 03/01/2021  Procedure: ESOPHAGOGASTRODUODENOSCOPY (EGD) WITH PROPOFOL;  Surgeon: Toney Reil, MD;  Location: [facility] SURGERY [facility]TR;  Service: Endoscopy;  Laterality: N/A;  ESOPHAGOGASTRODUODENOSCOPY (EGD) WITH PROPOFOL N/A 08/09/2021  Procedure: ESOPHAGOGASTRODUODENOSCOPY (EGD) WITH PROPOFOL;  Surgeon: Midge Minium, MD;  Location: [facility] ENDOSCOPY;  Service: Endoscopy;  Laterality: N/A;  ESOPHAGOGASTRODUODENOSCOPY (EGD) WITH PROPOFOL N/A 03/01/2023  Procedure: ESOPHAGOGASTRODUODENOSCOPY (EGD) WITH PROPOFOL;  Surgeon: Jaynie Collins, DO;  Location: [facility] ENDOSCOPY;  Service: Gastroenterology;  Laterality: N/A;  ESOPHAGOGASTRODUODENOSCOPY (EGD) WITH PROPOFOL N/A 04/08/2023  Procedure: ESOPHAGOGASTRODUODENOSCOPY (EGD) WITH PROPOFOL;  Surgeon: Tressia Danas, MD;  Location: [facility] ENDOSCOPY;  Service: Gastroenterology;  Laterality: N/A;  EVALUATION UNDER ANESTHESIA WITH HEMORRHOIDECTOMY N/A 10/31/2022  Procedure: EXAM UNDER ANESTHESIA WITH SUTURE LIGATION OF TWO BLEEDING PEDICLES;  Surgeon: Henrene Dodge, MD;  Location: [facility] ORS;  Service: General;  Laterality: N/A;  FLEXIBLE SIGMOIDOSCOPY N/A  10/02/2022  Procedure: FLEXIBLE SIGMOIDOSCOPY;  Surgeon: Meryl Dare, MD;  Location: [facility] ENDOSCOPY;  Service: Gastroenterology;  Laterality: N/A;  IR TIPS  04/11/2023  IR US GUIDE VASC ACCESS RIGHT  04/11/2023  IR US GUIDE VASC ACCESS RIGHT  04/11/2023  IR US GUIDE VASC ACCESS RIGHT  04/11/2023  LEFT HEART CATH AND CORONARY ANGIOGRAPHY N/A 11/04/2022  Procedure: LEFT HEART CATH AND CORONARY  ANGIOGRAPHY;  Surgeon: Iran Ouch, MD;  Location: [facility] INVASIVE CV LAB;  Service: Cardiovascular;  Laterality: N/A;  RADIOLOGY WITH ANESTHESIA N/A 04/11/2023  Procedure: IR WITH ANESTHESIA;  Surgeon: Oley Balm, MD;  Location: [facility] OR;  Service: Radiology;  Laterality: N/A;  VISCERAL ANGIOGRAPHY N/A 10/24/2022  Procedure: VISCERAL ANGIOGRAPHY;  Surgeon: Annice Needy, MD;  Location: [facility] INVASIVE CV LAB;  Service: Cardiovascular;  Laterality: N/A; Amanda L. Samson Frederic, MA CCC/SLP Clinical Specialist - Acute Care SLP Acute Rehabilitation Services Office number [phone] Blenda Mounts Laurice 04/21/2023, 9:57 AM       Scheduled Meds:  arformoterol  15 mcg Nebulization BID   chlordiazePOXIDE  10 mg Oral TID   Chlorhexidine Gluconate Cloth  6 each Topical Daily   enoxaparin (LOVENOX) injection  40 mg Subcutaneous Daily   feeding supplement (PROSource TF20)  60 mL Per Tube Daily   folic acid  1 mg Per Tube Daily   free water  200 mL Per Tube Q4H   furosemide  20 mg Oral Daily   gabapentin  300 mg Per Tube Daily   Gerhardt's butt cream   Topical BID   hydrocortisone  10 mg Per Tube Daily   hydrocortisone  5 mg Per Tube Daily   insulin aspart  0-15 Units Subcutaneous Q4H   lactulose  20 g Per Tube TID   midodrine  5 mg Per Tube Q8H   multivitamin with minerals  1 tablet Per Tube Daily   pantoprazole  40 mg Intravenous Q12H   potassium chloride  20 mEq Per Tube BID   propranolol  10 mg Per Tube TID   QUEtiapine  75 mg Per Tube QHS   revefenacin  175 mcg Nebulization Daily   rifaximin  550 mg Per Tube BID   rosuvastatin  40 mg Per Tube Daily   sodium chloride flush  10-40 mL Intracatheter Q12H   spironolactone  12.5 mg Per Tube Daily   thiamine  100 mg Per Tube Daily   Continuous Infusions:  sodium chloride Stopped (04/17/23 0000)   feeding supplement (VITAL 1.5 CAL) 1,000 mL (04/21/23 0129)     LOS: 14 days      Huey Bienenstock, MD [facility]   To contact the  attending provider between 7A-7P or the covering provider during after hours 7P-7A, please log into the web site [email or web address] and access using universal [place] password for that web site. If you do not have the password, please call the hospital operator.  04/21/2023, 11:26 AM

## 2023-04-21 NOTE — Progress Notes (Signed)
PT Cancellation Note  Patient Details Name: Ethan Wells MRN: [id number] DOB: 1971-01-12   Cancelled Treatment:    Reason Eval/Treat Not Completed: Pt eating lunch; will follow up for PT treatment as schedule permits.  Ina Homes, PT, DPT Acute Rehabilitation Services  Personal: Secure Chat Rehab Office: [phone]  Malachy Chamber 04/21/2023, 12:59 PM

## 2023-04-21 NOTE — Plan of Care (Signed)
  Problem: Education: Goal: Knowledge of General Education information will improve Description: Including pain rating scale, medication(s)/side effects and non-pharmacologic comfort measures Outcome: Progressing   Problem: Health Behavior/Discharge Planning: Goal: Ability to manage health-related needs will improve Outcome: Progressing   Problem: Clinical Measurements: Goal: Ability to maintain clinical measurements within normal limits will improve Outcome: Progressing Goal: Will remain free from infection Outcome: Progressing Goal: Diagnostic test results will improve Outcome: Progressing Goal: Respiratory complications will improve Outcome: Progressing Goal: Cardiovascular complication will be avoided Outcome: Progressing   Problem: Activity: Goal: Risk for activity intolerance will decrease Outcome: Progressing   Problem: Nutrition: Goal: Adequate nutrition will be maintained Outcome: Progressing   Problem: Coping: Goal: Level of anxiety will decrease Outcome: Progressing   Problem: Elimination: Goal: Will not experience complications related to bowel motility Outcome: Progressing Goal: Will not experience complications related to urinary retention Outcome: Progressing   Problem: Pain Managment: Goal: General experience of comfort will improve Outcome: Progressing   Problem: Safety: Goal: Ability to remain free from injury will improve Outcome: Progressing   Problem: Skin Integrity: Goal: Risk for impaired skin integrity will decrease Outcome: Progressing   Problem: Education: Goal: Ability to describe self-care measures that may prevent or decrease complications (Diabetes Survival Skills Education) will improve Outcome: Progressing Goal: Individualized Educational Video(s) Outcome: Progressing   Problem: Coping: Goal: Ability to adjust to condition or change in health will improve Outcome: Progressing   Problem: Fluid Volume: Goal: Ability to  maintain a balanced intake and output will improve Outcome: Progressing   Problem: Health Behavior/Discharge Planning: Goal: Ability to identify and utilize available resources and services will improve Outcome: Progressing Goal: Ability to manage health-related needs will improve Outcome: Progressing   Problem: Metabolic: Goal: Ability to maintain appropriate glucose levels will improve Outcome: Progressing   Problem: Nutritional: Goal: Maintenance of adequate nutrition will improve Outcome: Progressing Goal: Progress toward achieving an optimal weight will improve Outcome: Progressing   Problem: Skin Integrity: Goal: Risk for impaired skin integrity will decrease Outcome: Progressing   Problem: Tissue Perfusion: Goal: Adequacy of tissue perfusion will improve Outcome: Progressing   Problem: Education: Goal: Understanding of CV disease, CV risk reduction, and recovery process will improve Outcome: Progressing Goal: Individualized Educational Video(s) Outcome: Progressing   Problem: Activity: Goal: Ability to return to baseline activity level will improve Outcome: Progressing   Problem: Cardiovascular: Goal: Ability to achieve and maintain adequate cardiovascular perfusion will improve Outcome: Progressing Goal: Vascular access site(s) Level 0-1 will be maintained Outcome: Progressing   Problem: Health Behavior/Discharge Planning: Goal: Ability to safely manage health-related needs after discharge will improve Outcome: Progressing   Problem: Safety: Goal: Non-violent Restraint(s) Outcome: Progressing   Problem: Activity: Goal: Ability to tolerate increased activity will improve Outcome: Progressing   Problem: Respiratory: Goal: Ability to maintain a clear airway and adequate ventilation will improve Outcome: Progressing   Problem: Role Relationship: Goal: Method of communication will improve Outcome: Progressing   

## 2023-04-21 NOTE — Progress Notes (Signed)
Modified Barium Swallow Study  Patient Details  Name: ULA YINGER MRN: [id number] Date of Birth: Dec 05, 1971  Today's Date: 04/21/2023  Modified Barium Swallow completed.  Full report located under Chart Review in the Imaging Section.  History of Present Illness Patient is a 52 y.o. male with PMH: recurrent GI bleed with known variceal bleed, ischemic cardiomyopathy, ETOH abuse, polysubstance abuse, HTN, HLD, depression and anxiety. He presented to [facility] ED on 04/07/23 with acute GI bleed. He was admitted previous month for GI bleed and TIPS procedure recommended at that time but patient declined it. In ED, patient was hypotensive, mildly tachycardic. EGD completed 4/23. He was intubated 4/23 for flash pulmonary edema and extubated 4/25. He was reintubated on 4/27 for TIPS procedure but unable to be extubated following procedure. On 4/30 NG was placed and patient was extubated.  Pt has undergone BSE with recommendation for NPO -   Clinical Impression Pt presented with functional oropharyngeal swallow with adequate oral control of bolus, reliable laryngeal vestibule closure with no penetration/aspiration, normal pharyngeal stripping with no residue post-swallow, and normal ability to manage swallowing a pill with thin liquid with no aspiration and  transfer of pill through esophagus. Recommend starting a regular diet, thin liquids; meds in liquid. Mental status will be the primary barrier to adequate PO intake. SLP will f/u x1 to ensure functionality during mealtime. D/W RD, RN, MD. Factors that may increase risk of adverse event in presence of aspiration Rubye Oaks & Clearance Coots 2021): Reduced cognitive function  Swallow Evaluation Recommendations Recommendations: PO diet PO Diet Recommendation: Regular;Thin liquids (Level 0) Liquid Administration via: Cup;Straw Medication Administration: Whole meds with liquid Supervision: Patient able to self-feed;Set-up assistance for safety Oral care  recommendations: Oral care BID (2x/day)     Tariana Moldovan L. Samson Frederic, MA CCC/SLP Clinical Specialist - Acute Care SLP Acute Rehabilitation Services Office number [phone]  Blenda Mounts Laurice 04/21/2023,9:57 AM

## 2023-04-22 LAB — CBC
HCT: 31 % — ABNORMAL LOW (ref 39.0–52.0)
Hemoglobin: 10.2 g/dL — ABNORMAL LOW (ref 13.0–17.0)
MCH: 29.3 pg (ref 26.0–34.0)
MCHC: 32.9 g/dL (ref 30.0–36.0)
MCV: 89.1 fL (ref 80.0–100.0)
Platelets: 174 10*3/uL (ref 150–400)
RBC: 3.48 MIL/uL — ABNORMAL LOW (ref 4.22–5.81)
RDW: 19.2 % — ABNORMAL HIGH (ref 11.5–15.5)
WBC: 13 10*3/uL — ABNORMAL HIGH (ref 4.0–10.5)
nRBC: 0 % (ref 0.0–0.2)

## 2023-04-22 LAB — GLUCOSE, CAPILLARY
Glucose-Capillary: 173 mg/dL — ABNORMAL HIGH (ref 70–99)
Glucose-Capillary: 171 mg/dL — ABNORMAL HIGH (ref 70–99)
Glucose-Capillary: 177 mg/dL — ABNORMAL HIGH (ref 70–99)
Glucose-Capillary: 201 mg/dL — ABNORMAL HIGH (ref 70–99)
Glucose-Capillary: 211 mg/dL — ABNORMAL HIGH (ref 70–99)

## 2023-04-22 LAB — BASIC METABOLIC PANEL
Anion gap: 7 (ref 5–15)
BUN: 13 mg/dL (ref 6–20)
CO2: 17 mmol/L — ABNORMAL LOW (ref 22–32)
Calcium: 8.2 mg/dL — ABNORMAL LOW (ref 8.9–10.3)
Chloride: 107 mmol/L (ref 98–111)
Creatinine, Ser: 0.76 mg/dL (ref 0.61–1.24)
GFR, Estimated: >60 mL/min (ref >60)
Glucose, Bld: 191 mg/dL — ABNORMAL HIGH (ref 70–99)
Potassium: 4.3 mmol/L (ref 3.5–5.1)
Sodium: 131 mmol/L — ABNORMAL LOW (ref 135–145)

## 2023-04-22 LAB — PHOSPHORUS: Phosphorus: 2 mg/dL — ABNORMAL LOW (ref 2.5–4.6)

## 2023-04-22 LAB — AMMONIA: Ammonia: 74 umol/L — ABNORMAL HIGH (ref 9–35)

## 2023-04-22 LAB — MAGNESIUM: Magnesium: 2.2 mg/dL (ref 1.7–2.4)

## 2023-04-22 MED ORDER — ALPRAZOLAM 0.5 MG PO TABS
0.5000 mg | ORAL_TABLET | Freq: Three times a day (TID) | ORAL | Status: DC | PRN
  Administered 2023-04-22 – 2023-04-27 (×4): 0.5 mg via ORAL
  Filled 2023-04-22 (×4): qty 1

## 2023-04-22 MED ORDER — SODIUM PHOSPHATES 45 MMOLE/15ML IV SOLN
30.0000 mmol | Freq: Once | INTRAVENOUS | Status: AC
  Administered 2023-04-22: 30 mmol via INTRAVENOUS
  Filled 2023-04-22: qty 10

## 2023-04-22 NOTE — Plan of Care (Signed)
  Problem: Education: Goal: Knowledge of General Education information will improve Description: Including pain rating scale, medication(s)/side effects and non-pharmacologic comfort measures Outcome: Progressing   Problem: Health Behavior/Discharge Planning: Goal: Ability to manage health-related needs will improve Outcome: Progressing   Problem: Clinical Measurements: Goal: Ability to maintain clinical measurements within normal limits will improve Outcome: Progressing Goal: Will remain free from infection Outcome: Progressing Goal: Diagnostic test results will improve Outcome: Progressing Goal: Respiratory complications will improve Outcome: Progressing Goal: Cardiovascular complication will be avoided Outcome: Progressing   Problem: Activity: Goal: Risk for activity intolerance will decrease Outcome: Progressing   Problem: Nutrition: Goal: Adequate nutrition will be maintained Outcome: Progressing   Problem: Coping: Goal: Level of anxiety will decrease Outcome: Progressing   Problem: Elimination: Goal: Will not experience complications related to bowel motility Outcome: Progressing Goal: Will not experience complications related to urinary retention Outcome: Progressing   Problem: Pain Managment: Goal: General experience of comfort will improve Outcome: Progressing   Problem: Safety: Goal: Ability to remain free from injury will improve Outcome: Progressing   Problem: Skin Integrity: Goal: Risk for impaired skin integrity will decrease Outcome: Progressing   Problem: Education: Goal: Ability to describe self-care measures that may prevent or decrease complications (Diabetes Survival Skills Education) will improve Outcome: Progressing Goal: Individualized Educational Video(s) Outcome: Progressing   Problem: Coping: Goal: Ability to adjust to condition or change in health will improve Outcome: Progressing   Problem: Fluid Volume: Goal: Ability to  maintain a balanced intake and output will improve Outcome: Progressing   Problem: Health Behavior/Discharge Planning: Goal: Ability to identify and utilize available resources and services will improve Outcome: Progressing Goal: Ability to manage health-related needs will improve Outcome: Progressing   Problem: Metabolic: Goal: Ability to maintain appropriate glucose levels will improve Outcome: Progressing   Problem: Nutritional: Goal: Maintenance of adequate nutrition will improve Outcome: Progressing Goal: Progress toward achieving an optimal weight will improve Outcome: Progressing   Problem: Skin Integrity: Goal: Risk for impaired skin integrity will decrease Outcome: Progressing   Problem: Tissue Perfusion: Goal: Adequacy of tissue perfusion will improve Outcome: Progressing   Problem: Education: Goal: Understanding of CV disease, CV risk reduction, and recovery process will improve Outcome: Progressing Goal: Individualized Educational Video(s) Outcome: Progressing   Problem: Activity: Goal: Ability to return to baseline activity level will improve Outcome: Progressing   Problem: Cardiovascular: Goal: Ability to achieve and maintain adequate cardiovascular perfusion will improve Outcome: Progressing Goal: Vascular access site(s) Level 0-1 will be maintained Outcome: Progressing   Problem: Health Behavior/Discharge Planning: Goal: Ability to safely manage health-related needs after discharge will improve Outcome: Progressing   Problem: Safety: Goal: Non-violent Restraint(s) Outcome: Progressing   Problem: Activity: Goal: Ability to tolerate increased activity will improve Outcome: Progressing   Problem: Respiratory: Goal: Ability to maintain a clear airway and adequate ventilation will improve Outcome: Progressing   Problem: Role Relationship: Goal: Method of communication will improve Outcome: Progressing

## 2023-04-22 NOTE — Progress Notes (Signed)
Speech Language Pathology Treatment: Dysphagia  Patient Details Name: Ethan Wells MRN: [id number] DOB: 07/03/1971 Today's Date: 04/22/2023 Time: 1255-1310 SLP Time Calculation (min) (ACUTE ONLY): 15 min  Assessment / Plan / Recommendation Clinical Impression  Patient seen by SLP for skilled treatment focused on dysphagia goals. Patient was awake and alert but appears groggy. Voice varies from very low in intensity to aphonic and speech is dysarthric. SLP was only able to understand less than 25% of what he was saying. His breakfast tray was in his room and was untouched and RN reported patient has mainly been drinking liquids like sodas. Patient was receptive to having some orange juice and chocolate pudding, taking a few sips and eating a few bites before handing it back to SLP. No further skilled SLP services warranted at this time.  Patient will require supervision and encouragement for PO intake.   HPI HPI: Patient is a 52 y.o. male with PMH: recurrent GI bleed with known variceal bleed, ischemic cardiomyopathy, ETOH abuse, polysubstance abuse, HTN, HLD, depression and anxiety. He presented to [facility] ED on 04/07/23 with acute GI bleed. He was admitted previous month for GI bleed and TIPS procedure recommended at that time but patient declined it. In ED, patient was hypotensive, mildly tachycardic. EGD completed 4/23. He was intubated 4/23 for flash pulmonary edema and extubated 4/25. He was reintubated on 4/27 for TIPS procedure but unable to be extubated following procedure. On 4/30 NG was placed and patient was extubated.  Pt has undergone BSE with recommendation for NPO -      SLP Plan  Discharge SLP treatment due to (comment);All goals met      Recommendations for follow up therapy are one component of a multi-disciplinary discharge planning process, led by the attending physician.  Recommendations may be updated based on patient status, additional functional criteria and insurance  authorization.    Recommendations  Diet recommendations: Regular;Thin liquid Liquids provided via: Cup;Straw Medication Administration: Whole meds with liquid Supervision: Full supervision/cueing for compensatory strategies;Staff to assist with self feeding Compensations: Minimize environmental distractions;Slow rate Postural Changes and/or Swallow Maneuvers: Seated upright 90 degrees                  Oral care BID   Frequent or constant Supervision/Assistance Dysphagia, unspecified (R13.10)     Discharge SLP treatment due to (comment);All goals met     Ethan Nevin, MA, CCC-SLP Speech Therapy

## 2023-04-22 NOTE — Progress Notes (Signed)
Physical Therapy Treatment Patient Details Name: Ethan Wells MRN: [id number] DOB: [birth year] Today's Date: 04/22/2023   History of Present Illness Ethan Wells is a 52 yo male presented to [facility] ED 4/22 for acute GI bleed. Of note patient was seen within this health sytem 1 month ago for significant GI bleed requiring banding per GI with TIPS procedure recommended but pt declined. Pt transferred to [facility] due to sepsis resulting in VDRF 4/23-4/25.  Patient reports last drink and cocaine use prior to admit was day prior to admission.   TIPS procedure 4/26  with pt VDRF 4/26-4/29.    PMH significant for recurrent GI bleed with known variceal bleed, ischemic cardiomyopathy, EtOH abuse, polysubstance abuse, HTN, HLD, depression, and anxiety    PT Comments    Pt tolerated today's session well, fatiguing easily with mobility but able to recover after seated rest breaks. Pt able to ambulate to the chair with min-modA and RW, only tolerating ~3 feet today. Pt performing 2 sit<>stand trials from chair with cueing for proper hand placement. Pt continues to be limited by cognition, balance, strength, and activity tolerance, acute PT will continue to follow to progress mobility and address deficits, discharge recommendations remain appropriate.     Recommendations for follow up therapy are one component of a multi-disciplinary discharge planning process, led by the attending physician.  Recommendations may be updated based on patient status, additional functional criteria and insurance authorization.  Follow Up Recommendations  Can patient physically be transported by private vehicle: No    Assistance Recommended at Discharge Frequent or constant Supervision/Assistance  Patient can return home with the following A little help with bathing/dressing/bathroom;Assistance with cooking/housework;Assist for transportation;Help with stairs or ramp for entrance;A lot of help with walking and/or transfers    Equipment Recommendations  Rollator (4 wheels)    Recommendations for Other Services OT consult     Precautions / Restrictions Precautions Precautions: Fall;Other (comment) Precaution Comments: cortrak Restrictions Weight Bearing Restrictions: No     Mobility  Bed Mobility Overal bed mobility: Needs Assistance Bed Mobility: Supine to Sit     Supine to sit: Min assist, HOB elevated     General bed mobility comments: minA and increased time for supine>sit. Pt self managing BLE with cueing for technique, assist for pulling trunk up and pivoting to the EOB    Transfers Overall transfer level: Needs assistance Equipment used: Rolling walker (2 wheels) Transfers: Sit to/from Stand, Bed to chair/wheelchair/BSC Sit to Stand: Mod assist   Step pivot transfers: Min assist       General transfer comment: modA to power up into standing, 1 trial from bed and 2 trials from recliner, cueing for proper hand placement with RW. minA for balance and cueing for technique to pivot to chair    Ambulation/Gait Ambulation/Gait assistance: Min assist Gait Distance (Feet): 3 Feet Assistive device: Rolling walker (2 wheels) Gait Pattern/deviations: Step-to pattern, Decreased stride length, Decreased dorsiflexion - right, Decreased dorsiflexion - left, Shuffle, Trunk flexed Gait velocity: decreased     General Gait Details: brief ambulation over to chair with minA for balance and cueing for sequencing. Increased trunk flexion and pt fatiguing, requiring seated rest break before attempting further mobility, declining further ambulation trial   Stairs             Wheelchair Mobility    Modified Rankin (Stroke Patients Only)       Balance Overall balance assessment: Needs assistance Sitting-balance support: Feet supported, Bilateral upper extremity supported Sitting  balance-Leahy Scale: Fair     Standing balance support: Bilateral upper extremity supported, During functional  activity, Reliant on assistive device for balance Standing balance-Leahy Scale: Poor Standing balance comment: Needed UE and external support                            Cognition Arousal/Alertness: Awake/alert Behavior During Therapy: Flat affect Overall Cognitive Status: No family/caregiver present to determine baseline cognitive functioning                                 General Comments: pt continues to require increased time for processing and command following, able to make needs known but is very soft spoken.        Exercises      General Comments General comments (skin integrity, edema, etc.): VSS on room air, no family at bedside      Pertinent Vitals/Pain Pain Assessment Pain Assessment: Faces Faces Pain Scale: No hurt    Home Living                          Prior Function            PT Goals (current goals can now be found in the care plan section) Acute Rehab PT Goals Patient Stated Goal: to go back to group home PT Goal Formulation: With patient Time For Goal Achievement: 04/25/23 Potential to Achieve Goals: Good Progress towards PT goals: Progressing toward goals    Frequency    Min 2X/week      PT Plan Current plan remains appropriate;Frequency needs to be updated    Co-evaluation              AM-PAC PT "6 Clicks" Mobility   Outcome Measure  Help needed turning from your back to your side while in a flat bed without using bedrails?: A Little Help needed moving from lying on your back to sitting on the side of a flat bed without using bedrails?: A Little Help needed moving to and from a bed to a chair (including a wheelchair)?: A Lot Help needed standing up from a chair using your arms (e.g., wheelchair or bedside chair)?: A Lot Help needed to walk in hospital room?: Total Help needed climbing 3-5 steps with a railing? : Total 6 Click Score: 12    End of Session Equipment Utilized During  Treatment: Gait belt Activity Tolerance: Patient tolerated treatment well Patient left: in chair;with call bell/phone within reach;with chair alarm set Nurse Communication: Mobility status PT Visit Diagnosis: Unsteadiness on feet (R26.81);Muscle weakness (generalized) (M62.81)     Time: 1610-9604 PT Time Calculation (min) (ACUTE ONLY): 26 min  Charges:  $Therapeutic Activity: 23-37 mins                     Lindalou Hose, PT DPT Acute Rehabilitation Services Office [phone]    Leonie Man 04/22/2023, 3:49 PM

## 2023-04-22 NOTE — Progress Notes (Signed)
PROGRESS NOTE    [name]  VVO:[id number] DOB: 08/17/1971 DOA: 04/07/2023 PCP: [name], NP    No chief complaint on file.   Brief Narrative:   Ethan Wells is a 52 yo male with a PMH significant for recurrent GI bleed with known variceal bleed, ischemic cardiomyopathy, EtOH abuse, polysubstance abuse, HTN, HLD, depression, and anxiety who presented to [facility] ED 4/22 for acute GI bleed. Of note patient was seen within this health sytem 1 month ago for significant GI bleed requiring banding per GI with TIPS procedure recommended but patient declined. Patient reports last drink and cocaine use was day prior to admission.   On ED arrival patient was seen hypotensive and mildly tachycardic. Lab work significant for K 2.3, glucoses 140, Mg 1.2, alkaline phos 153, albumin 2.3, AST 44, total protein 5.6, total bili 1.6, lactic 5.6, hgb 7.1, and hct 23.2. Given active bleed with compromised hemodynamics, PCCM consulted for further management and admission.   Significant events during hospital stay  4/22 admitted with acute GI bleed  4/23 EGD >> X esophagus, grade 2 varices, banded x 2, portal gastropathy 4/23 intubated for flash pulm edema , started on Lasix and proned 4/25 extubated 4/27 TIPS, re intubated for procedure. Did not meet extubation criteria and was returned to ICU on vent.  4/29 remains intubated, mental status precluding extubation 4/30 NGT placed, extubated 5/2 [facility] 5[facility]>> did MBS evaluation, started on regular diet    Assessment & Plan:   Principal Problem:   GI bleed Active Problems:   Anemia due to GI blood loss   Acute hypoxic respiratory failure (HCC)   Secondary esophageal varices with bleeding (HCC)   Esophageal varices with bleeding in diseases classified elsewhere (HCC)   Hepatic encephalopathy (HCC)   Alcoholic cirrhosis (HCC)  Acute respiratory failure with hypoxia, resolving -  Possible transfused ARDS. Extubated 4/25, but reintubated 4/27 for TIPS. Returned from IR intubated, extubated 4/30.  -PT/mobilize -pulmonary hygiene - encouraged to use spirometry and flutter valve.   Hypernatremia Hypophosphatemia Hypokalemia -Continue to monitor closely, and replete as needed.  Today, repleted.   Alcoholic liver cirrhosis  variceal bleed /portal gastropathy Thrombocytopenia, mild - Post banding, TIPS - con't PPI Q12h - no need for BB from EV ppx standpoint - Repeat surveillance EGD in 3 weeks, TIPS duplex ordered by GI - transfuse for Hb <7 or hemodynamically significant bleeding - -completed empiric ceftriaxone -appreciate GI's management -Continue with lactulose, dose has been increased given persistently elevated ammonia level -Started on rifaximin -Continues to improve. -Blood pressure is soft today, will DC propranolol      Hx Systolic congestive heart failure (EF 50-55% with WMA 02/04/23), HoTN CAD, HTN, HLD P: -PTA midodrine -lasix as needed -monitor on tele -resume PTA spironolactone -minimize IVF   Hx adrenal insufficiency  -continue home hydrocort   Acute metabolic encephalopathy, hepatic encephalopathy Hx polysubstance abuse including EtOH improving - vitamins -Patient currently off Precedex -Started on regular diet, to DC core Trak tube when oral intake has improved      DVT prophylaxis: Lovenox Code Status: Full Family Communication: none at bedside Disposition:   Status is: Inpatient    Consultants:  PCCM GI   Subjective:  No significant events overnight as discussed with staff, patient denies any complaints today  Objective: Vitals:   04/22/23 1143 04/22/23 1144 04/22/23 1145 04/22/23 1146  BP: (!) 95/46     Pulse: 85 86 87   Resp: (!)  26 (!) 25 (!) 23 20  Temp:  98.7 F (37.1 C)  98.7 F (37.1 C)  TempSrc:    Oral  SpO2: 99% 96% 98%   Weight:      Height:        Intake/Output Summary (Last 24 hours) at  04/22/2023 1253 Last data filed at 04/22/2023 0933 Gross per 24 hour  Intake --  Output 875 ml  Net -875 ml   Filed Weights   04/20/23 0430 04/21/23 0446 04/22/23 0400  Weight: 84.3 kg 83.2 kg 85.8 kg    Examination:  Awake Alert, more alert and appropriate, speech is faint, but comprehendible Symmetrical Chest wall movement, Good air movement bilaterally, CTAB RRR,No Gallops,Rubs or new Murmurs, No Parasternal Heave +ve B.Sounds, Abd Soft, No tenderness, No rebound - guarding or rigidity. No Cyanosis, Clubbing or edema, No new Rash or bruise       Data Reviewed: I have personally reviewed following labs and imaging studies  CBC: Recent Labs  Lab 04/17/23 0935 04/18/23 0433 04/19/23 0330 04/20/23 1423 04/22/23 0442  WBC 10.5 13.9* 15.7* 19.0* 13.0*  NEUTROABS 7.1  --   --   --   --   HGB 9.3* 10.5* 10.0* 11.1* 10.2*  HCT 29.1* 33.0* 31.2* 35.7* 31.0*  MCV 91.8 90.9 91.5 92.0 89.1  PLT 118* 142* 137* 167 174    Basic Metabolic Panel: Recent Labs  Lab 04/17/23 0502 04/18/23 0433 04/19/23 0330 04/20/23 1423 04/22/23 0442  NA 143 141 137 135 131*  K 3.0* 3.9 3.9 3.8 4.3  CL 107 113* 113* 111 107  CO2 24 20* 18* 17* 17*  GLUCOSE 133* 139* 136* 144* 191*  BUN 8 6 7 10 13   CREATININE 0.64 0.57* 0.61 0.65 0.76  CALCIUM 7.9* 8.4* 8.0* 8.4* 8.2*  MG 1.7 2.0 2.0 1.9 2.2  PHOS 1.9* 2.3* 2.4* 2.5 2.0*    GFR: Estimated Creatinine Clearance: 111.3 mL/min (by C-G formula based on SCr of 0.76 mg/dL).  Liver Function Tests: Recent Labs  Lab 04/17/23 0502  AST 69*  ALT 44  ALKPHOS 181*  BILITOT 2.6*  PROT 5.8*  ALBUMIN 2.2*    CBG: Recent Labs  Lab 04/21/23 2106 04/21/23 2337 04/22/23 0358 04/22/23 0852 04/22/23 1143  GLUCAP 218* 210* 173* 171* 177*     Recent Results (from the past 240 hour(s))  Culture, Respiratory w Gram Stain     Status: None   Collection Time: 04/13/23 11:35 AM   Specimen: Tracheal Aspirate; Respiratory  Result Value Ref  Range Status   Specimen Description TRACHEAL ASPIRATE  Final   Special Requests NONE  Final   Gram Stain   Final    FEW WBC PRESENT, PREDOMINANTLY PMN NO ORGANISMS SEEN    Culture   Final    RARE Normal respiratory flora-no Staph aureus or Pseudomonas seen Performed at [facility], [address] [place], [place] [address]    Report Status 04/15/2023 FINAL  Final         Radiology Studies: DG Swallowing Func-Speech Pathology  Result Date: 04/21/2023 Table formatting from the original result was not included. Modified Barium Swallow Study Patient Details Name: INA STRAUSS MRN: [id number] Date of Birth: 1971-08-22 Today's Date: 04/21/2023 HPI/PMH: HPI: Patient is a 52 y.o. male with PMH: recurrent GI bleed with known variceal bleed, ischemic cardiomyopathy, ETOH abuse, polysubstance abuse, HTN, HLD, depression and anxiety. He presented to [facility] ED on 04/07/23 with acute GI bleed. He was admitted previous  month for GI bleed and TIPS procedure recommended at that time but patient declined it. In ED, patient was hypotensive, mildly tachycardic. EGD completed 4/23. He was intubated 4/23 for flash pulmonary edema and extubated 4/25. He was reintubated on 4/27 for TIPS procedure but unable to be extubated following procedure. On 4/30 NG was placed and patient was extubated.  Pt has undergone BSE with recommendation for NPO - Clinical Impression: Clinical Impression: Pt presented with functional oropharyngeal swallow with adequate oral control of bolus, reliable laryngeal vestibule closure with no penetration/aspiration, normal pharyngeal stripping with no residue post-swallow, and normal ability to manage swallowing a pill with thin liquid with no aspiration and  transfer of pill through esophagus. Recommend starting a regular diet, thin liquids; meds in liquid. Mental status will be the primary barrier to adequate PO intake. SLP will f/u x1 to ensure functionality during mealtime. D/W RD, RN,  MD. Factors that may increase risk of adverse event in presence of aspiration Rubye Oaks & Clearance Coots 2021): Factors that may increase risk of adverse event in presence of aspiration Rubye Oaks & Clearance Coots 2021): Reduced cognitive function Recommendations/Plan: Swallowing Evaluation Recommendations Swallowing Evaluation Recommendations Recommendations: PO diet PO Diet Recommendation: Regular; Thin liquids (Level 0) Liquid Administration via: Cup; Straw Medication Administration: Whole meds with liquid Supervision: Patient able to self-feed; Set-up assistance for safety Oral care recommendations: Oral care BID (2x/day) Treatment Plan Treatment Plan Treatment recommendations: Therapy as outlined in treatment plan below Follow-up recommendations: No SLP follow up Treatment frequency: Min 1x/week Treatment duration: 1 week Interventions: Diet toleration management by SLP Recommendations Recommendations for follow up therapy are one component of a multi-disciplinary discharge planning process, led by the attending physician.  Recommendations may be updated based on patient status, additional functional criteria and insurance authorization. Assessment: Orofacial Exam: Orofacial Exam Oral Cavity - Dentition: Poor condition; Missing dentition Orofacial Anatomy: WFL Oral Motor/Sensory Function: WFL Anatomy: Anatomy: WFL Boluses Administered: Boluses Administered Boluses Administered: Thin liquids (Level 0); Mildly thick liquids (Level 2, nectar thick); Puree; Solid  Oral Impairment Domain: Oral Impairment Domain Lip Closure: No labial escape Tongue control during bolus hold: Escape to lateral buccal cavity/floor of mouth Bolus preparation/mastication: Timely and efficient chewing and mashing Bolus transport/lingual motion: Brisk tongue motion Oral residue: Trace residue lining oral structures Location of oral residue : Tongue Initiation of pharyngeal swallow : Pyriform sinuses  Pharyngeal Impairment Domain: Pharyngeal Impairment Domain  Soft palate elevation: No bolus between soft palate (SP)/pharyngeal wall (PW) Laryngeal elevation: Complete superior movement of thyroid cartilage with complete approximation of arytenoids to epiglottic petiole Anterior hyoid excursion: Complete anterior movement Epiglottic movement: Complete inversion Laryngeal vestibule closure: Complete, no air/contrast in laryngeal vestibule Pharyngeal stripping wave : Present - complete Pharyngeal contraction (A/P view only): N/A Pharyngoesophageal segment opening: Complete distension and complete duration, no obstruction of flow Tongue base retraction: No contrast between tongue base and posterior pharyngeal wall (PPW) Pharyngeal residue: Complete pharyngeal clearance Location of pharyngeal residue: N/A  Esophageal Impairment Domain: Esophageal Impairment Domain Esophageal clearance upright position: Complete clearance, esophageal coating Pill: Esophageal Impairment Domain Esophageal clearance upright position: Complete clearance, esophageal coating Penetration/Aspiration Scale Score: Penetration/Aspiration Scale Score 1.  Material does not enter airway: Thin liquids (Level 0); Mildly thick liquids (Level 2, nectar thick); Puree; Solid; Pill Compensatory Strategies: Compensatory Strategies Compensatory strategies: Yes Straw: Effective   General Information: Caregiver present: No  Diet Prior to this Study: NPO   Temperature : Febrile   Respiratory Status: WFL   Supplemental O2: None (Room air)  History of Recent Intubation: Yes  Behavior/Cognition: Alert; Cooperative; Requires cueing Self-Feeding Abilities: Able to self-feed Baseline vocal quality/speech: Hypophonia/low volume Volitional Cough: Able to elicit Volitional Swallow: Able to elicit Exam Limitations: Limited visibility; Excessive movement Goal Planning: Prognosis for improved oropharyngeal function: Good No data recorded No data recorded Patient/Family Stated Goal: HOB reclined No data recorded Pain: Pain Assessment  Pain Assessment: No/denies pain Pain Score: 8 Faces Pain Scale: 4 Breathing: 0 Negative Vocalization: 0 Facial Expression: 0 Body Language: 0 Consolability: 0 PAINAD Score: 0 Facial Expression: 0 Body Movements: 0 Muscle Tension: 0 Compliance with ventilator (intubated pts.): N/A Vocalization (extubated pts.): 0 CPOT Total: 0 Pain Location: abdomen Pain Descriptors / Indicators: Aching; Grimacing; Guarding Pain Intervention(s): Limited activity within patient's tolerance; Monitored during session; Repositioned End of Session: Start Time:SLP Start Time (ACUTE ONLY): 1610 Stop Time: SLP Stop Time (ACUTE ONLY): 9604 Time Calculation:SLP Time Calculation (min) (ACUTE ONLY): 18 min Charges: SLP Evaluations $ SLP Speech Visit: 1 Visit SLP Evaluations $BSS Swallow: 1 Procedure $MBS Swallow: 1 Procedure $Swallowing Treatment: 1 Procedure SLP visit diagnosis: SLP Visit Diagnosis: Dysphagia, unspecified (R13.10) Past Medical History: Past Medical History: Diagnosis Date  CAD (coronary artery disease)   a. 01/2011 Anterior STEMI/Cath/PCI: LM nl, LAD 100d (3.5x27mm Vision BMS placed), LCX 33m, RI 50, RCA min irregs, EF 40% w/ apical, inferoapical HK; b. 10/2022 NSTSEMI/Cath: LM nl, LAD 5p/m, RI 85, LCX 129m, OM1 mild dzs, OM3 100 fills via L->L collats from dLAD, RCA 80p, 137m, RPDA fills via collats from LAD. EF 45-50%-->Med Rx.  Cardiac arrest - ventricular fibrillation   a. In setting of STEMI 01/2011  Chronic pain   Cocaine abuse (HCC)   Depression with anxiety   ETOH abuse   a. 6-12 beers/day  GERD (gastroesophageal reflux disease)   GI bleed   Hemorrhoids   Hyperlipidemia   Hypertension   Ischemic cardiomyopathy   a. 06/2011 Echo: EF 45-50%, No rwma; b. 10/2022 Echo: EF 55-60%, no rwma, nl RV fxn.  Marijuana abuse   Migraines   Tobacco abuse   a. 1/2 ppd x 26 yrs Past Surgical History: Past Surgical History: Procedure Laterality Date  COLONOSCOPY  08/09/2021  Procedure: COLONOSCOPY;  Surgeon: Midge Minium, MD;  Location: [facility]  ENDOSCOPY;  Service: Endoscopy;;  CORONARY ANGIOPLASTY WITH STENT PLACEMENT    ESOPHAGEAL BANDING  04/08/2023  Procedure: ESOPHAGEAL BANDING;  Surgeon: Tressia Danas, MD;  Location: [facility] ENDOSCOPY;  Service: Gastroenterology;;  ESOPHAGOGASTRODUODENOSCOPY N/A 05/30/2022  Procedure: ESOPHAGOGASTRODUODENOSCOPY (EGD);  Surgeon: Toney Reil, MD;  Location: [facility] ENDOSCOPY;  Service: Gastroenterology;  Laterality: N/A;  ESOPHAGOGASTRODUODENOSCOPY (EGD) WITH PROPOFOL N/A 03/01/2021  Procedure: ESOPHAGOGASTRODUODENOSCOPY (EGD) WITH PROPOFOL;  Surgeon: Toney Reil, MD;  Location: [facility]ERY CNTR;  Service: Endoscopy;  Laterality: N/A;  ESOPHAGOGASTRODUODENOSCOPY (EGD) WITH PROPOFOL N/A 08/09/2021  Procedure: ESOPHAGOGASTRODUODENOSCOPY (EGD) WITH PROPOFOL;  Surgeon: Midge Minium, MD;  Location: [facility] ENDOSCOPY;  Service: Endoscopy;  Laterality: N/A;  ESOPHAGOGASTRODUODENOSCOPY (EGD) WITH PROPOFOL N/A 03/01/2023  Procedure: ESOPHAGOGASTRODUODENOSCOPY (EGD) WITH PROPOFOL;  Surgeon: Jaynie Collins, DO;  Location: [facility] ENDOSCOPY;  Service: Gastroenterology;  Laterality: N/A;  ESOPHAGOGASTRODUODENOSCOPY (EGD) WITH PROPOFOL N/A 04/08/2023  Procedure: ESOPHAGOGASTRODUODENOSCOPY (EGD) WITH PROPOFOL;  Surgeon: Tressia Danas, MD;  Location: [facility] ENDOSCOPY;  Service: Gastroenterology;  Laterality: N/A;  EVALUATION UNDER ANESTHESIA WITH HEMORRHOIDECTOMY N/A 10/31/2022  Procedure: EXAM UNDER ANESTHESIA WITH SUTURE LIGATION OF TWO BLEEDING PEDICLES;  Surgeon: Henrene Dodge, MD;  Location: [facility] ORS;  Service: General;  Laterality: N/A;  FLEXIBLE SIGMOIDOSCOPY N/A 10/02/2022  Procedure:  FLEXIBLE SIGMOIDOSCOPY;  Surgeon: Meryl Dare, MD;  Location: [facility] ENDOSCOPY;  Service: Gastroenterology;  Laterality: N/A;  IR TIPS  04/11/2023  IR US GUIDE VASC ACCESS RIGHT  04/11/2023  IR US GUIDE VASC ACCESS RIGHT  04/11/2023  IR US GUIDE VASC ACCESS RIGHT  04/11/2023  LEFT HEART CATH AND CORONARY ANGIOGRAPHY N/A 11/04/2022   Procedure: LEFT HEART CATH AND CORONARY ANGIOGRAPHY;  Surgeon: Iran Ouch, MD;  Location: [facility] INVASIVE CV LAB;  Service: Cardiovascular;  Laterality: N/A;  RADIOLOGY WITH ANESTHESIA N/A 04/11/2023  Procedure: IR WITH ANESTHESIA;  Surgeon: Oley Balm, MD;  Location: [facility] OR;  Service: Radiology;  Laterality: N/A;  VISCERAL ANGIOGRAPHY N/A 10/24/2022  Procedure: VISCERAL ANGIOGRAPHY;  Surgeon: Annice Needy, MD;  Location: [facility] INVASIVE CV LAB;  Service: Cardiovascular;  Laterality: N/A; Amanda L. Samson Frederic, MA CCC/SLP Clinical Specialist - Acute Care SLP Acute Rehabilitation Services Office number [phone] Blenda Mounts Laurice 04/21/2023, 9:57 AM       Scheduled Meds:  arformoterol  15 mcg Nebulization BID   Chlorhexidine Gluconate Cloth  6 each Topical Daily   enoxaparin (LOVENOX) injection  40 mg Subcutaneous Daily   feeding supplement  237 mL Oral BID BM   feeding supplement (PROSource TF20)  60 mL Per Tube Daily   folic acid  1 mg Per Tube Daily   free water  100 mL Per Tube Q4H   furosemide  20 mg Oral Daily   gabapentin  300 mg Per Tube Daily   Gerhardt's butt cream   Topical BID   hydrocortisone  10 mg Per Tube Daily   hydrocortisone  5 mg Per Tube Daily   insulin aspart  0-15 Units Subcutaneous Q4H   lactulose  20 g Per Tube TID   midodrine  5 mg Per Tube Q8H   multivitamin with minerals  1 tablet Per Tube Daily   pantoprazole  40 mg Intravenous Q12H   potassium chloride  20 mEq Per Tube BID   propranolol  10 mg Per Tube TID   QUEtiapine  75 mg Per Tube QHS   revefenacin  175 mcg Nebulization Daily   rifaximin  550 mg Per Tube BID   rosuvastatin  40 mg Per Tube Daily   sodium chloride flush  10-40 mL Intracatheter Q12H   spironolactone  12.5 mg Per Tube Daily   thiamine  100 mg Per Tube Daily   Continuous Infusions:  sodium chloride Stopped (04/17/23 0000)   feeding supplement (VITAL 1.5 CAL) 1,000 mL (04/21/23 0129)   sodium phosphate 30 mmol in dextrose 5 %  250 mL infusion 30 mmol (04/22/23 0902)     LOS: 15 days      Huey Bienenstock, MD [facility]   To contact the attending provider between 7A-7P or the covering provider during after hours 7P-7A, please log into the web site [email or web address] and access using universal [place] password for that web site. If you do not have the password, please call the hospital operator.  04/22/2023, 12:53 PM

## 2023-04-23 ENCOUNTER — Telehealth (HOSPITAL_COMMUNITY): Payer: Self-pay | Admitting: Pharmacy Technician

## 2023-04-23 ENCOUNTER — Other Ambulatory Visit (HOSPITAL_COMMUNITY): Payer: Self-pay

## 2023-04-23 LAB — GLUCOSE, CAPILLARY
Glucose-Capillary: 134 mg/dL — ABNORMAL HIGH (ref 70–99)
Glucose-Capillary: 165 mg/dL — ABNORMAL HIGH (ref 70–99)
Glucose-Capillary: 174 mg/dL — ABNORMAL HIGH (ref 70–99)
Glucose-Capillary: 194 mg/dL — ABNORMAL HIGH (ref 70–99)
Glucose-Capillary: 196 mg/dL — ABNORMAL HIGH (ref 70–99)
Glucose-Capillary: 200 mg/dL — ABNORMAL HIGH (ref 70–99)
Glucose-Capillary: 238 mg/dL — ABNORMAL HIGH (ref 70–99)

## 2023-04-23 LAB — CBC
HCT: 31.8 % — ABNORMAL LOW (ref 39.0–52.0)
Hemoglobin: 10.1 g/dL — ABNORMAL LOW (ref 13.0–17.0)
MCH: 28.8 pg (ref 26.0–34.0)
MCHC: 31.8 g/dL (ref 30.0–36.0)
MCV: 90.6 fL (ref 80.0–100.0)
Platelets: 183 10*3/uL (ref 150–400)
RBC: 3.51 MIL/uL — ABNORMAL LOW (ref 4.22–5.81)
RDW: 19 % — ABNORMAL HIGH (ref 11.5–15.5)
WBC: 10.6 10*3/uL — ABNORMAL HIGH (ref 4.0–10.5)
nRBC: 0 % (ref 0.0–0.2)

## 2023-04-23 LAB — BASIC METABOLIC PANEL
Anion gap: 6 (ref 5–15)
BUN: 13 mg/dL (ref 6–20)
CO2: 18 mmol/L — ABNORMAL LOW (ref 22–32)
Calcium: 8.1 mg/dL — ABNORMAL LOW (ref 8.9–10.3)
Chloride: 107 mmol/L (ref 98–111)
Creatinine, Ser: 0.67 mg/dL (ref 0.61–1.24)
GFR, Estimated: >60 mL/min (ref >60)
Glucose, Bld: 186 mg/dL — ABNORMAL HIGH (ref 70–99)
Potassium: 4.4 mmol/L (ref 3.5–5.1)
Sodium: 131 mmol/L — ABNORMAL LOW (ref 135–145)

## 2023-04-23 LAB — AMMONIA: Ammonia: 66 umol/L — ABNORMAL HIGH (ref 9–35)

## 2023-04-23 NOTE — TOC Progression Note (Signed)
Transition of Care [facility]) - Progression Note    Patient Details  Name: DACEN PRUDENCIO MRN: [id number] Date of Birth: 09-Mar-1971  [facility]) CM/SW Contact  Mearl Latin, LCSW Phone Number: 04/23/2023, 8:45 AM  Clinical Narrative:    CSW following for medical progression.      Barriers to Discharge: Continued Medical Work up (Substance use)  Expected Discharge Plan and Services                                               Social Determinants of Health (SDOH) Interventions SDOH Screenings   Food Insecurity: No Food Insecurity (04/07/2023)  Housing: Low Risk  (04/07/2023)  Transportation Needs: No Transportation Needs (04/07/2023)  Utilities: Not At Risk (04/07/2023)  Alcohol Screen: Medium Risk (10/01/2022)  Tobacco Use: High Risk (04/13/2023)    Readmission Risk Interventions    02/24/2023    3:12 PM 01/17/2023    3:38 PM 10/22/2022   10:47 AM  Readmission Risk Prevention Plan  Transportation Screening Complete Complete Complete  PCP or Specialist Appt within 3-5 Days   Complete  Social Work Consult for Recovery Care Planning/Counseling   Complete  Palliative Care Screening   Not Applicable  Medication Review Oceanographer) Complete Complete Complete  SW Recovery Care/Counseling Consult  Not Complete   SW Consult Not Complete Comments  lethargic   Palliative Care Screening Not Applicable Not Applicable   Skilled Nursing Facility Not Applicable Not Applicable

## 2023-04-23 NOTE — Evaluation (Signed)
Occupational Therapy Evaluation Patient Details Name: Ethan Wells MRN: [id number] DOB: Apr 26, 1971 Today's Date: 04/23/2023   History of Present Illness Ethan Wells is a 52 yo male presented to [facility] ED 4/22 for acute GI bleed. Of note patient was seen within this health sytem 1 month ago for significant GI bleed requiring banding per GI with TIPS procedure recommended but pt declined. Pt transferred to [facility] due to sepsis resulting in VDRF 4/23-4/25.  Patient reports last drink and cocaine use prior to admit was day prior to admission.   TIPS procedure 4/26  with pt VDRF 4/26-4/29.    PMH significant for recurrent GI bleed with known variceal bleed, ischemic cardiomyopathy, EtOH abuse, polysubstance abuse, HTN, HLD, depression, and anxiety   Clinical Impression   At baseline, pt was completing ADLs and IADLs with Mod I. However, pt reports he felt he often needed assistance at baseline, but had no one available to assist. Pt previous performed functional mobility Mod I using a SPC or RW (2 wheel) and was driving. Pt presents with decreased Bilateral UE strength, decreased activity tolerance, decreased sequencing ability, decreased balance during functional activities, and decreased safety and independence with ADLs, functional transfers, and functional mobility. Pt currently demonstrates ability to perform UB ADLs with Set up to Mod assist and LB ADLs with Mod to Total assist. Pt currently requires Mod assist with use of RW for functional transfers. Pt will benefit from acute skilled OT services to address deficits outlined below and increase safety and independence with ADLs and functional transfers/mobility. Post acute discharge, pt will benefit from intensive inpatient skilled OT services <3 hours per day.      Recommendations for follow up therapy are one component of a multi-disciplinary discharge planning process, led by the attending physician.  Recommendations may be updated based on  patient status, additional functional criteria and insurance authorization.   Assistance Recommended at Discharge Frequent or constant Supervision/Assistance  Patient can return home with the following A lot of help with walking and/or transfers;A lot of help with bathing/dressing/bathroom;Assistance with cooking/housework;Assistance with feeding;Direct supervision/assist for medications management;Direct supervision/assist for financial management;Assist for transportation;Help with stairs or ramp for entrance    Functional Status Assessment  Patient has had a recent decline in their functional status and demonstrates the ability to make significant improvements in function in a reasonable and predictable amount of time.  Equipment Recommendations  Other (comment) (TBD depending on progress)    Recommendations for Other Services       Precautions / Restrictions Precautions Precautions: Fall;Other (comment) Precaution Comments: cortrak Restrictions Weight Bearing Restrictions: No      Mobility Bed Mobility Overal bed mobility: Needs Assistance Bed Mobility: Supine to Sit, Sit to Supine     Supine to sit: Min assist, HOB elevated Sit to supine: Min assist   General bed mobility comments: At end of session, pt required Mod/Max assist +2 to scoot up in bed secondary to fatigue following 12 minutes sitting EOB.    Transfers Overall transfer level: Needs assistance Equipment used: Rolling walker (2 wheels) Transfers: Sit to/from Stand, Bed to chair/wheelchair/BSC Sit to Stand: Mod assist Stand pivot transfers: Max assist                Balance Overall balance assessment: Needs assistance Sitting-balance support: Bilateral upper extremity supported, Single extremity supported, Feet supported Sitting balance-Leahy Scale: Fair     Standing balance support: Bilateral upper extremity supported, During functional activity, Reliant on assistive device for balance Standing  balance-Leahy Scale: Poor                             ADL either performed or assessed with clinical judgement   ADL Overall ADL's : Needs assistance/impaired Eating/Feeding: Set up (Set up for self feeding with extra time. Poor intake. Currently has feeding tube.) Eating/Feeding Details (indicate cue type and reason): Pt currently has feeding tube. Grooming: Minimal assistance;Sitting;Cueing for sequencing   Upper Body Bathing: Moderate assistance;Cueing for safety;Cueing for sequencing;Sitting   Lower Body Bathing: Maximal assistance;Bed level;Cueing for safety;Cueing for sequencing;Cueing for compensatory techniques   Upper Body Dressing : Minimal assistance;Cueing for sequencing;Cueing for compensatory techniques;Sitting (with extra time)   Lower Body Dressing: Total assistance;Bed level   Toilet Transfer: Moderate assistance;Stand-pivot;BSC/3in1;Cueing for safety;Cueing for sequencing   Toileting- Clothing Manipulation and Hygiene: Maximal assistance;Sit to/from stand;Cueing for sequencing;Cueing for safety;Cueing for compensatory techniques         General ADL Comments: Pt with decreased activity tolerance, sequencing ability, safety awareness, and balance during functional taks.     Vision Baseline Vision/History: 1 Wears glasses (Pt reports he wears reading glasses. No glasses present during OT evaluation.) Ability to See in Adequate Light: 0 Adequate Patient Visual Report: No change from baseline       Perception     Praxis Praxis Praxis tested?: Deficits Deficits: Organization Praxis-Other Comments: Requires extra time for motor planning    Pertinent Vitals/Pain Pain Assessment Pain Assessment: Faces Faces Pain Scale: Hurts little more Pain Location: Generalized Pain Descriptors / Indicators: Aching, Grimacing Pain Intervention(s): Limited activity within patient's tolerance, Monitored during session, Repositioned     Hand Dominance Right    Extremity/Trunk Assessment Upper Extremity Assessment Upper Extremity Assessment: Generalized weakness   Lower Extremity Assessment Lower Extremity Assessment: Defer to PT evaluation   Cervical / Trunk Assessment Cervical / Trunk Assessment: Kyphotic   Communication Communication Communication: Other (comment) (Soft voice, occasionally difficult to hear)   Cognition Arousal/Alertness: Awake/alert Behavior During Therapy: Flat affect Overall Cognitive Status: No family/caregiver present to determine baseline cognitive functioning                                 General Comments: Requires increased time for processing, following commands, and motor planning. Decreased sequencing ability. Able to follow 1 step commands consistantly with extra time.     General Comments  VSS on RA.    Exercises     Shoulder Instructions      Home Living Family/patient expects to be discharged to:: Private residence Living Arrangements: Alone Available Help at Discharge: Other (Comment) (Pt reports he may be able to receive occasional assistance from other residents of group home.) Type of Home: Group Home Home Access: Stairs to enter Entrance Stairs-Number of Steps: 5-6 Entrance Stairs-Rails: Left;Right Home Layout: Able to live on main level with bedroom/bathroom     Bathroom Shower/Tub: Producer, television/film/video: Standard     Home Equipment: Cane - single Librarian, academic (2 wheels);Shower seat          Prior Functioning/Environment Prior Level of Function : Independent/Modified Independent;Driving             Mobility Comments: Uses SPC/RW for mobility ADLs Comments: Pt reports he previously needed occasional assistance with ADLs and ongoing assistance with IADLs, but had no assistance available.        OT Problem List: Decreased  strength;Decreased activity tolerance;Impaired balance (sitting and/or standing);Decreased coordination;Decreased  cognition;Decreased safety awareness;Decreased knowledge of use of DME or AE;Decreased knowledge of precautions      OT Treatment/Interventions: Self-care/ADL training;Therapeutic exercise;DME and/or AE instruction;Therapeutic activities;Cognitive remediation/compensation;Patient/family education;Balance training    OT Goals(Current goals can be found in the care plan section) Acute Rehab OT Goals Patient Stated Goal: To go back to group home OT Goal Formulation: With patient Time For Goal Achievement: 05/07/23 Potential to Achieve Goals: Good ADL Goals Pt Will Perform Grooming: with supervision;sitting Pt Will Perform Upper Body Bathing: with supervision;sitting Pt Will Perform Lower Body Bathing: with min assist;sit to/from stand Pt Will Transfer to Toilet: with min guard assist;bedside commode Pt Will Perform Toileting - Clothing Manipulation and hygiene: with min assist;sit to/from stand  OT Frequency: Min 2X/week    Co-evaluation              AM-PAC OT "6 Clicks" Daily Activity     Outcome Measure Help from another person eating meals?: A Little (for self feeding) Help from another person taking care of personal grooming?: A Little Help from another person toileting, which includes using toliet, bedpan, or urinal?: A Lot Help from another person bathing (including washing, rinsing, drying)?: A Lot Help from another person to put on and taking off regular upper body clothing?: A Little Help from another person to put on and taking off regular lower body clothing?: Total 6 Click Score: 14   End of Session Nurse Communication: Mobility status  Activity Tolerance: Patient tolerated treatment well Patient left: in bed;with call bell/phone within reach;with bed alarm set  OT Visit Diagnosis: Muscle weakness (generalized) (M62.81);Unsteadiness on feet (R26.81)                Time: 4098-1191 OT Time Calculation (min): 37 min Charges:  OT General Charges $OT Visit: 1  Visit OT Evaluation $OT Eval Moderate Complexity: 1 Mod OT Treatments $Self Care/Home Management : 8-22 mins  Betti Goodenow "Orson Eva., OTR/L, MA Acute Rehab [phone]   Lendon Colonel 04/23/2023, 3:31 PM

## 2023-04-23 NOTE — Telephone Encounter (Signed)
Pharmacy Patient Advocate Encounter  Insurance verification completed.    The patient is insured through [facility] Part D   The patient is currently admitted and ran test claims for the following: Xifaxan.  Copays and coinsurance results were relayed to Inpatient clinical team.

## 2023-04-23 NOTE — TOC Benefit Eligibility Note (Signed)
Patient Product/process development scientist completed.    The patient is currently admitted and upon discharge could be taking Xifaxan 550 mg tablets.  The current 30 day co-pay is $0.00.   The patient is insured through [employer] Part D   This test claim was processed through [facility]- copay amounts may vary at other pharmacies due to pharmacy/plan contracts, or as the patient moves through the different stages of their insurance plan.  Roland Earl, CPHT Pharmacy Patient Advocate Specialist [facility] Patient Advocate Team Direct Number: [phone]  Fax: [phone]

## 2023-04-23 NOTE — TOC Progression Note (Signed)
Transition of Care [facility]) - Progression Note    Patient Details  Name: Ethan Wells MRN: [id number] Date of Birth: January 31, 1971  Transition of [facility] CM/SW Contact  Mearl Latin, LCSW Phone Number: 04/23/2023, 8:44 AM  Clinical Narrative:    Patient transferred to 5W; still with cortrak. Will continue to follow.      Barriers to Discharge: Continued Medical Work up (Substance use)  Expected Discharge Plan and Services                                               Social Determinants of Health (SDOH) Interventions SDOH Screenings   Food Insecurity: No Food Insecurity (04/07/2023)  Housing: Low Risk  (04/07/2023)  Transportation Needs: No Transportation Needs (04/07/2023)  Utilities: Not At Risk (04/07/2023)  Alcohol Screen: Medium Risk (10/01/2022)  Tobacco Use: High Risk (04/13/2023)    Readmission Risk Interventions    02/24/2023    3:12 PM 01/17/2023    3:38 PM 10/22/2022   10:47 AM  Readmission Risk Prevention Plan  Transportation Screening Complete Complete Complete  PCP or Specialist Appt within 3-5 Days   Complete  Social Work Consult for Recovery Care Planning/Counseling   Complete  Palliative Care Screening   Not Applicable  Medication Review Oceanographer) Complete Complete Complete  SW Recovery Care/Counseling Consult  Not Complete   SW Consult Not Complete Comments  lethargic   Palliative Care Screening Not Applicable Not Applicable   Skilled Nursing Facility Not Applicable Not Applicable

## 2023-04-23 NOTE — Progress Notes (Signed)
PROGRESS NOTE        PATIENT DETAILS Name: Ethan Wells Age: 52 y.o. Sex: male Date of Birth: May 11, 1971 Admit Date: 04/07/2023 Admitting Physician Starleen Arms, MD WUJ:[id number], Margit Banda, NP  Brief Summary: Patient is a 52 y.o.  male with history of polysubstance abuse/EtOH use, small amount of sludge within the gallbladder.  Who presented with GI bleeding-underwent EGD with banding-Hospital course complicated by acute hypoxic respiratory failure requiring intubation.  See below for further details.  Significant events: 4/22>> admitted with acute GI bleed  4/23>> EGD >>  grade 2 varices, banded x 2, portal gastropathy 4/23 >>intubated 4/25>> extubated 4/27>> TIPS, re intubated for procedure. Did not meet extubation criteria and was returned to ICU on vent.  4/30>> NGT placed, extubated 5/02 >> transferred to [facility] 5/06>> MBS evaluation, started on regular diet  Significant studies: 4/26>> TTE: EF 55-60%, grade 2 diastolic dysfunction 5/02>> RUQ ultrasound: Cirrhosis-small amount of sludge in gallbladder.  Significant microbiology data: 4/28>> tracheal aspirate: No organisms  Procedures: 4/23>> EGD 4/27>> TIPS  Consults: PCCM GI IR  Subjective: Lying comfortably in bed-denies any chest pain or shortness of breath.  Objective: Vitals: Blood pressure 98/72, pulse 94, temperature 98.2 F (36.8 C), temperature source Oral, resp. rate 20, height 5\' 5"  (1.651 m), weight 84.7 kg, SpO2 99 %.   Exam: Gen Exam: Not in distress-slightly confused this morning. HEENT:atraumatic, normocephalic Chest: B/L clear to auscultation anteriorly CVS:S1S2 regular Abdomen:soft non tender, non distended Extremities:no edema Neurology: Non focal-generalized weakness. Skin: no rash  Pertinent Labs/Radiology:    Latest Ref Rng & Units 04/23/2023    4:11 AM 04/22/2023    4:42 AM 04/20/2023    2:23 PM  CBC  WBC 4.0 - 10.5 K/uL 10.6  13.0  19.0    Hemoglobin 13.0 - 17.0 g/dL 21.3  08.6  57.8   Hematocrit 39.0 - 52.0 % 31.8  31.0  35.7   Platelets 150 - 400 K/uL 183  174  167     Lab Results  Component Value Date   NA 131 (L) 04/23/2023   K 4.4 04/23/2023   CL 107 04/23/2023   CO2 18 (L) 04/23/2023      Assessment/Plan: Acute hypoxic respiratory failure Resolved-on room air Per prior chart review-thought to have acute pulmonary edema-related to transfusion.  Upper GI bleed-secondary to esophageal varices/portal gastropathy Acute blood loss anemia in the setting of GI bleeding S/p EGD with banding on 4/23 and TIPS procedure on 4/27 GI bleeding seems to have resolved Hb stable Continue PPI Follow CBC  Acute hepatic encephalopathy Remains intermittently confused Continue rifaximin/lactulose  Alcoholic liver cirrhosis-with portal gastropathy, hypersplenism induced thrombocytopenia Volume status relatively stable Continue Lasix/Aldactone  Chronic HFpEF Volume status relatively stable Continue diuretics  History of CAD-s/p STEMI with V-fib arrest 2012-s/p LAD PCI Non-STEMI 11/04/2022-LHC with patent LAD stent-medical management recommended by cardiology Not on antiplatelets given severity of GI bleeding Continue statin  Adrenal insufficiency Continue hydrocortisone  Mood disorder Seroquel  EtOH use Polysubstance abuse Drug screen positive for benzo's/cocaine Should be out of window for withdrawal symptoms  Deconditioning related to acute illness Oropharyngeal dysphagia related to acute illness/muscular deconditioning Encourage oral intake Remove cortrak tube when oral intake is adequate PT/OT-SNF recommended.  Nutrition Status: Nutrition Problem: Inadequate oral intake Etiology: inability to eat Signs/Symptoms: NPO status Interventions: Ensure Enlive (each supplement provides  350kcal and 20 grams of protein), MVI, Tube feeding, Prostat  Obesity: Estimated body mass index is 31.07 kg/m as  calculated from the following:   Height as of this encounter: 5\' 5"  (1.651 m).   Weight as of this encounter: 84.7 kg.   Code status:   Code Status: Full Code   DVT Prophylaxis: enoxaparin (LOVENOX) injection 40 mg Start: 04/15/23 1115 Place TED hose Start: 04/13/23 1047 SCDs Start: 04/07/23 2045    Family Communication: None at bedside   Disposition Plan: Status is: Inpatient Remains inpatient appropriate because: Severity of illness   Planned Discharge Destination:Skilled nursing facility   Diet: Diet Order             Diet regular Room service appropriate? Yes with Assist; Fluid consistency: Thin  Diet effective now                     Antimicrobial agents: Anti-infectives (From admission, onward)    Start     Dose/Rate Route Frequency Ordered Stop   04/20/23 1230  rifaximin (XIFAXAN) tablet 550 mg        550 mg Per Tube 2 times daily 04/20/23 1132     04/11/23 1045  vancomycin (VANCOCIN) IVPB 1000 mg/200 mL premix        1,000 mg 200 mL/hr over 60 Minutes Intravenous  Once 04/11/23 0952 04/11/23 1211   04/09/23 0830  cefTRIAXone (ROCEPHIN) 1 g in sodium chloride 0.9 % 100 mL IVPB  Status:  Discontinued        1 g 200 mL/hr over 30 Minutes Intravenous Every 24 hours 04/08/23 0913 04/11/23 1133   04/08/23 1400  cefTRIAXone (ROCEPHIN) 2 g in sodium chloride 0.9 % 100 mL IVPB  Status:  Discontinued        2 g 200 mL/hr over 30 Minutes Intravenous Every 24 hours 04/07/23 2016 04/08/23 0742   04/08/23 0800  cefTRIAXone (ROCEPHIN) 2 g in sodium chloride 0.9 % 100 mL IVPB  Status:  Discontinued        2 g 200 mL/hr over 30 Minutes Intravenous Every 24 hours 04/08/23 0742 04/08/23 0913        MEDICATIONS: Scheduled Meds:  arformoterol  15 mcg Nebulization BID   Chlorhexidine Gluconate Cloth  6 each Topical Daily   enoxaparin (LOVENOX) injection  40 mg Subcutaneous Daily   feeding supplement  237 mL Oral BID BM   feeding supplement (PROSource TF20)  60 mL  Per Tube Daily   folic acid  1 mg Per Tube Daily   free water  100 mL Per Tube Q4H   furosemide  20 mg Oral Daily   gabapentin  300 mg Per Tube Daily   Gerhardt's butt cream   Topical BID   hydrocortisone  10 mg Per Tube Daily   hydrocortisone  5 mg Per Tube Daily   insulin aspart  0-15 Units Subcutaneous Q4H   lactulose  20 g Per Tube TID   midodrine  5 mg Per Tube Q8H   multivitamin with minerals  1 tablet Per Tube Daily   pantoprazole  40 mg Intravenous Q12H   potassium chloride  20 mEq Per Tube BID   QUEtiapine  75 mg Per Tube QHS   revefenacin  175 mcg Nebulization Daily   rifaximin  550 mg Per Tube BID   rosuvastatin  40 mg Per Tube Daily   sodium chloride flush  10-40 mL Intracatheter Q12H   spironolactone  12.5 mg Per Tube  Daily   thiamine  100 mg Per Tube Daily   Continuous Infusions:  sodium chloride Stopped (04/17/23 0000)   feeding supplement (VITAL 1.5 CAL) 55 mL/hr at 04/22/23 1743   PRN Meds:.sodium chloride, ALPRAZolam, docusate sodium, ipratropium-albuterol, mouth rinse, mouth rinse, oxyCODONE, polyethylene glycol, sodium chloride flush   I have personally reviewed following labs and imaging studies  LABORATORY DATA: CBC: Recent Labs  Lab 04/17/23 0935 04/18/23 0433 04/19/23 0330 04/20/23 1423 04/22/23 0442 04/23/23 0411  WBC 10.5 13.9* 15.7* 19.0* 13.0* 10.6*  NEUTROABS 7.1  --   --   --   --   --   HGB 9.3* 10.5* 10.0* 11.1* 10.2* 10.1*  HCT 29.1* 33.0* 31.2* 35.7* 31.0* 31.8*  MCV 91.8 90.9 91.5 92.0 89.1 90.6  PLT 118* 142* 137* 167 174 183    Basic Metabolic Panel: Recent Labs  Lab 04/17/23 0502 04/18/23 0433 04/19/23 0330 04/20/23 1423 04/22/23 0442 04/23/23 0411  NA 143 141 137 135 131* 131*  K 3.0* 3.9 3.9 3.8 4.3 4.4  CL 107 113* 113* 111 107 107  CO2 24 20* 18* 17* 17* 18*  GLUCOSE 133* 139* 136* 144* 191* 186*  BUN 8 6 7 10 13 13   CREATININE 0.64 0.57* 0.61 0.65 0.76 0.67  CALCIUM 7.9* 8.4* 8.0* 8.4* 8.2* 8.1*  MG 1.7 2.0  2.0 1.9 2.2  --   PHOS 1.9* 2.3* 2.4* 2.5 2.0*  --     GFR: Estimated Creatinine Clearance: 110.6 mL/min (by C-G formula based on SCr of 0.67 mg/dL).  Liver Function Tests: Recent Labs  Lab 04/17/23 0502  AST 69*  ALT 44  ALKPHOS 181*  BILITOT 2.6*  PROT 5.8*  ALBUMIN 2.2*   Recent Labs  Lab 04/17/23 0502  LIPASE 27  AMYLASE 29   Recent Labs  Lab 04/19/23 0330 04/20/23 1321 04/22/23 0442 04/23/23 0411  AMMONIA 72* 62* 74* 66*    Coagulation Profile: No results for input(s): "INR", "PROTIME" in the last 168 hours.  Cardiac Enzymes: No results for input(s): "CKTOTAL", "CKMB", "CKMBINDEX", "TROPONINI" in the last 168 hours.  BNP (last 3 results) No results for input(s): "PROBNP" in the last 8760 hours.  Lipid Profile: No results for input(s): "CHOL", "HDL", "LDLCALC", "TRIG", "CHOLHDL", "LDLDIRECT" in the last 72 hours.  Thyroid Function Tests: No results for input(s): "TSH", "T4TOTAL", "FREET4", "T3FREE", "THYROIDAB" in the last 72 hours.  Anemia Panel: No results for input(s): "VITAMINB12", "FOLATE", "FERRITIN", "TIBC", "IRON", "RETICCTPCT" in the last 72 hours.  Urine analysis:    Component Value Date/Time   COLORURINE YELLOW (A) 02/22/2023 1055   APPEARANCEUR CLEAR (A) 02/22/2023 1055   APPEARANCEUR Clear 03/16/2012 1315   LABSPEC 1.018 02/22/2023 1055   LABSPEC 1.013 03/16/2012 1315   PHURINE 5.0 02/22/2023 1055   GLUCOSEU NEGATIVE 02/22/2023 1055   GLUCOSEU 50 mg/dL 40/98/1191 4782   HGBUR NEGATIVE 02/22/2023 1055   BILIRUBINUR NEGATIVE 02/22/2023 1055   BILIRUBINUR Negative 03/16/2012 1315   KETONESUR 5 (A) 02/22/2023 1055   PROTEINUR NEGATIVE 02/22/2023 1055   NITRITE NEGATIVE 02/22/2023 1055   LEUKOCYTESUR NEGATIVE 02/22/2023 1055   LEUKOCYTESUR Negative 03/16/2012 1315    Sepsis Labs: Lactic Acid, Venous    Component Value Date/Time   LATICACIDVEN 2.3 (HH) 04/07/2023 2208    MICROBIOLOGY: Recent Results (from the past 240 hour(s))   Culture, Respiratory w Gram Stain     Status: None   Collection Time: 04/13/23 11:35 AM   Specimen: Tracheal Aspirate; Respiratory  Result Value Ref Range  Status   Specimen Description TRACHEAL ASPIRATE  Final   Special Requests NONE  Final   Gram Stain   Final    FEW WBC PRESENT, PREDOMINANTLY PMN NO ORGANISMS SEEN    Culture   Final    RARE Normal respiratory flora-no Staph aureus or Pseudomonas seen Performed at [facility], [address] [place], [place] [address]    Report Status 04/15/2023 FINAL  Final    RADIOLOGY STUDIES/RESULTS: No results found.   LOS: 16 days   Jeoffrey Massed, MD  [facility]    To contact the attending provider between 7A-7P or the covering provider during after hours 7P-7A, please log into the web site [email or web address] and access using universal [place] password for that web site. If you do not have the password, please call the hospital operator.  04/23/2023, 10:37 AM

## 2023-04-24 DIAGNOSIS — F39 Unspecified mood [affective] disorder: Secondary | ICD-10-CM | POA: Diagnosis not present

## 2023-04-24 LAB — GLUCOSE, CAPILLARY
Glucose-Capillary: 131 mg/dL — ABNORMAL HIGH (ref 70–99)
Glucose-Capillary: 184 mg/dL — ABNORMAL HIGH (ref 70–99)
Glucose-Capillary: 200 mg/dL — ABNORMAL HIGH (ref 70–99)
Glucose-Capillary: 207 mg/dL — ABNORMAL HIGH (ref 70–99)
Glucose-Capillary: 207 mg/dL — ABNORMAL HIGH (ref 70–99)
Glucose-Capillary: 238 mg/dL — ABNORMAL HIGH (ref 70–99)

## 2023-04-24 LAB — BASIC METABOLIC PANEL
Anion gap: 6 (ref 5–15)
BUN: 10 mg/dL (ref 6–20)
CO2: 18 mmol/L — ABNORMAL LOW (ref 22–32)
Calcium: 8.3 mg/dL — ABNORMAL LOW (ref 8.9–10.3)
Chloride: 109 mmol/L (ref 98–111)
Creatinine, Ser: 0.67 mg/dL (ref 0.61–1.24)
GFR, Estimated: >60 mL/min (ref >60)
Glucose, Bld: 210 mg/dL — ABNORMAL HIGH (ref 70–99)
Potassium: 4.1 mmol/L (ref 3.5–5.1)
Sodium: 133 mmol/L — ABNORMAL LOW (ref 135–145)

## 2023-04-24 LAB — CBC
HCT: 29.4 % — ABNORMAL LOW (ref 39.0–52.0)
Hemoglobin: 9.3 g/dL — ABNORMAL LOW (ref 13.0–17.0)
MCH: 28.9 pg (ref 26.0–34.0)
MCHC: 31.6 g/dL (ref 30.0–36.0)
MCV: 91.3 fL (ref 80.0–100.0)
Platelets: 207 10*3/uL (ref 150–400)
RBC: 3.22 MIL/uL — ABNORMAL LOW (ref 4.22–5.81)
RDW: 18.8 % — ABNORMAL HIGH (ref 11.5–15.5)
WBC: 9.4 10*3/uL (ref 4.0–10.5)
nRBC: 0 % (ref 0.0–0.2)

## 2023-04-24 LAB — AMMONIA: Ammonia: 42 umol/L — ABNORMAL HIGH (ref 9–35)

## 2023-04-24 MED ORDER — BOOST / RESOURCE BREEZE PO LIQD CUSTOM: 1.0000 | Freq: Three times a day (TID) | ORAL | Status: DC

## 2023-04-24 MED ORDER — OSMOLITE 1.5 CAL PO LIQD
1000.0000 mL | ORAL | Status: DC
  Administered 2023-04-24 – 2023-04-25 (×2): 1000 mL
  Filled 2023-04-24 (×3): qty 1000

## 2023-04-24 NOTE — Progress Notes (Signed)
Patient discharged with PTAR to skilled facility.

## 2023-04-24 NOTE — TOC Progression Note (Signed)
Transition of Care [facility]) - Progression Note    Patient Details  Name: Ethan Wells MRN: [id number] Date of Birth: 11/23/1971  Transition of Care [facility]) CM/SW Contact  Mearl Latin, LCSW Phone Number: 04/24/2023, 6:09 PM  Clinical Narrative:    CSW following for medical progression. Placement barrier includes cortrak and substance use.      Barriers to Discharge: Continued Medical Work up (Substance use)  Expected Discharge Plan and Services                                               Social Determinants of Health (SDOH) Interventions SDOH Screenings   Food Insecurity: No Food Insecurity (04/07/2023)  Housing: Low Risk  (04/07/2023)  Transportation Needs: No Transportation Needs (04/07/2023)  Utilities: Not At Risk (04/07/2023)  Alcohol Screen: Medium Risk (10/01/2022)  Tobacco Use: High Risk (04/13/2023)    Readmission Risk Interventions    02/24/2023    3:12 PM 01/17/2023    3:38 PM 10/22/2022   10:47 AM  Readmission Risk Prevention Plan  Transportation Screening Complete Complete Complete  PCP or Specialist Appt within 3-5 Days   Complete  Social Work Consult for Recovery Care Planning/Counseling   Complete  Palliative Care Screening   Not Applicable  Medication Review Oceanographer) Complete Complete Complete  SW Recovery Care/Counseling Consult  Not Complete   SW Consult Not Complete Comments  lethargic   Palliative Care Screening Not Applicable Not Applicable   Skilled Nursing Facility Not Applicable Not Applicable

## 2023-04-24 NOTE — Progress Notes (Signed)
Nutrition Follow-up  DOCUMENTATION CODES:   Not applicable  INTERVENTION:  Discontinue Ensure Enlive, replace with Boost Breeze po TID, each supplement provides 250 kcal and 9 grams of protein Continue Folic acid, Thiamine, and Multivitamin w/ minerals daily Transition to nocturnal tube Feeds via Cortrak: Switch to Osmolite 1.5 at 65 mL/hr x 12 hours from 1800 to 0600 60 mL ProSource TF20 - daily Free water flush: 100 mL q4h Tube Feeds at goal provide 1250 kcal, 69 gm protein, 991 mL free water daily.  Meal ordering with assist  NUTRITION DIAGNOSIS:   Inadequate oral intake related to inability to eat as evidenced by NPO status. - Progressing, now on a diet  GOAL:   Patient will meet greater than or equal to 90% of their needs - Being addressed via TF and PO  MONITOR:   PO intake, Supplement acceptance, Labs, I & O's  REASON FOR ASSESSMENT:   Ventilator    ASSESSMENT:   52 y.o. male presented to [facility] ED with acute GI bleed. Pt recently admitted for GI bleed one month prior. PMH includes HTN, CAD, EtOH abuse, polysubstance abuse, CHD, alcohol cirrhosis w/ esophageal varices, and HLD.  04/22 - admitted 04/23 - intubated 04/25 - extubated, diet advanced to clear liquids 04/26 - NPO s/p TIPS, remained intubated post-procedure 04/30 - extubated, NG tube placed 05/01 - pt pulled NG tube, Cortrak placed (tip at pylorus or proximal duodenum), SLP recommending NPO; transferred to floor 05/06 - MBS; diet advanced to regular, thin liquids 05/09 - transitioned to nocturnal TF   RD received a consult to transition pt to nocturnal feeds.  Pt sitting up in bed, RN at bedside. Pt did not eat breakfast. Tried to drinking orange juice while RD in room, pt took one sip and said it was gross and did not want it. Reports he is not drinking the Ensure because he does not like them. RD explained transitioning to nocturnal feeds to increase PO intake.    Medications reviewed and include:  Folic acid, Lasix, NovoLog SSI, Lactulose, MVI, Protonix, Potassium Chloride, Spironolactone, Thiamine Labs reviewed: Sodium 133, Potassium 4.1, 24 hr CBGs 131-238  Diet Order:   Diet Order             Diet regular Room service appropriate? Yes with Assist; Fluid consistency: Thin  Diet effective now                   EDUCATION NEEDS:   Not appropriate for education at this time  Skin:  Skin Assessment: Reviewed RN Assessment (scattered closed incisions)  Last BM:  5/8  Height:   Ht Readings from Last 1 Encounters:  04/13/23 5\' 5"  (1.651 m)    Weight:   Wt Readings from Last 1 Encounters:  04/24/23 84.7 kg   Ideal Body Weight:  61.8 kg  BMI:  Body mass index is 31.07 kg/m.  Estimated Nutritional Needs:  Kcal:  2000-2200 Protein:  100-120 grams Fluid:  >/= 2 L   Kirby Crigler RD, LDN Clinical Dietitian See [facility] for contact information.

## 2023-04-24 NOTE — Progress Notes (Signed)
PROGRESS NOTE        PATIENT DETAILS Name: Ethan Wells Age: 52 y.o. Sex: male Date of Birth: [birth year] Admit Date: 04/07/2023 Admitting Physician Starleen Arms, MD WUJ:[id number], Margit Banda, NP  Brief Summary: Patient is a 52 y.o.  male with history of polysubstance abuse/EtOH use, small amount of sludge within the gallbladder.  Who presented with GI bleeding-underwent EGD with banding-Hospital course complicated by acute hypoxic respiratory failure requiring intubation.  See below for further details.  Significant events: 4/22>> admitted with acute GI bleed  4/23>> EGD >>  grade 2 varices, banded x 2, portal gastropathy 4/23 >>intubated 4/25>> extubated 4/27>> TIPS, re intubated for procedure. Did not meet extubation criteria and was returned to ICU on vent.  4/30>> NGT placed, extubated 5/02 >> transferred to [facility] 5/06>> MBS evaluation, started on regular diet  Significant studies: 4/26>> TTE: EF 55-60%, grade 2 diastolic dysfunction 5/02>> RUQ ultrasound: Cirrhosis-small amount of sludge in gallbladder.  Significant microbiology data: 4/28>> tracheal aspirate: No organisms  Procedures: 4/23>> EGD 4/27>> TIPS  Consults: PCCM GI IR  Subjective: Much more awake and alert today.  Spoke with RN who had the patient yesterday-starting to eat a bit more.  Objective: Vitals: Blood pressure 116/62, pulse 98, temperature 98.3 F (36.8 C), temperature source Oral, resp. rate 17, height 5\' 5"  (1.651 m), weight 84.7 kg, SpO2 99 %.   Exam: Gen Exam:not in any distress HEENT:atraumatic, normocephalic Chest: B/L clear to auscultation anteriorly CVS:S1S2 regular Abdomen:soft non tender, non distended Extremities:no edema Neurology: Non focal Skin: no rash  Pertinent Labs/Radiology:    Latest Ref Rng & Units 04/24/2023    6:42 AM 04/23/2023    4:11 AM 04/22/2023    4:42 AM  CBC  WBC 4.0 - 10.5 K/uL 9.4  10.6  13.0   Hemoglobin 13.0 -  17.0 g/dL 9.3  21.3  08.6   Hematocrit 39.0 - 52.0 % 29.4  31.8  31.0   Platelets 150 - 400 K/uL 207  183  174     Lab Results  Component Value Date   NA 133 (L) 04/24/2023   K 4.1 04/24/2023   CL 109 04/24/2023   CO2 18 (L) 04/24/2023      Assessment/Plan: Acute hypoxic respiratory failure Resolved-on room air Per prior chart review-thought to have acute pulmonary edema-related to transfusion.  Upper GI bleed-secondary to esophageal varices/portal gastropathy Acute blood loss anemia in the setting of GI bleeding S/p EGD with banding on 4/23 and TIPS procedure on 4/27 GI bleeding seems to have resolved Hb stable Continue PPI Follow CBC periodically.  Acute hepatic encephalopathy Mental status much better-slowly improving on a daily basis. Continue rifaximin/lactulose  Alcoholic liver cirrhosis-with portal gastropathy, hypersplenism induced thrombocytopenia Volume status relatively stable Continue Lasix/Aldactone  Chronic HFpEF Volume status relatively stable Continue diuretics  History of CAD-s/p STEMI with V-fib arrest 2012-s/p LAD PCI Non-STEMI 11/04/2022-LHC with patent LAD stent-medical management recommended by cardiology Not on antiplatelets given severity of GI bleeding Continue statin  Adrenal insufficiency Continue hydrocortisone  Mood disorder Seroquel  EtOH use Polysubstance abuse Drug screen positive for benzo's/cocaine Should be out of window for withdrawal symptoms  Deconditioning related to acute illness Oropharyngeal dysphagia related to acute illness/muscular deconditioning Oral intake improving-switch to nocturnal tube feeds PT/OT-SNF recommended.  Per social work-he will be difficult to place.  Nutrition Status: Nutrition  Problem: Inadequate oral intake Etiology: inability to eat Signs/Symptoms: NPO status Interventions: Ensure Enlive (each supplement provides 350kcal and 20 grams of protein), MVI, Tube feeding,  Prostat  Obesity: Estimated body mass index is 31.07 kg/m as calculated from the following:   Height as of this encounter: 5\' 5"  (1.651 m).   Weight as of this encounter: 84.7 kg.   Code status:   Code Status: Full Code   DVT Prophylaxis: enoxaparin (LOVENOX) injection 40 mg Start: 04/15/23 1115 Place TED hose Start: 04/13/23 1047 SCDs Start: 04/07/23 2045    Family Communication: None at bedside   Disposition Plan: Status is: Inpatient Remains inpatient appropriate because: Severity of illness   Planned Discharge Destination:Skilled nursing facility   Diet: Diet Order             Diet regular Room service appropriate? Yes with Assist; Fluid consistency: Thin  Diet effective now                     Antimicrobial agents: Anti-infectives (From admission, onward)    Start     Dose/Rate Route Frequency Ordered Stop   04/20/23 1230  rifaximin (XIFAXAN) tablet 550 mg        550 mg Per Tube 2 times daily 04/20/23 1132     04/11/23 1045  vancomycin (VANCOCIN) IVPB 1000 mg/200 mL premix        1,000 mg 200 mL/hr over 60 Minutes Intravenous  Once 04/11/23 0952 04/11/23 1211   04/09/23 0830  cefTRIAXone (ROCEPHIN) 1 g in sodium chloride 0.9 % 100 mL IVPB  Status:  Discontinued        1 g 200 mL/hr over 30 Minutes Intravenous Every 24 hours 04/08/23 0913 04/11/23 1133   04/08/23 1400  cefTRIAXone (ROCEPHIN) 2 g in sodium chloride 0.9 % 100 mL IVPB  Status:  Discontinued        2 g 200 mL/hr over 30 Minutes Intravenous Every 24 hours 04/07/23 2016 04/08/23 0742   04/08/23 0800  cefTRIAXone (ROCEPHIN) 2 g in sodium chloride 0.9 % 100 mL IVPB  Status:  Discontinued        2 g 200 mL/hr over 30 Minutes Intravenous Every 24 hours 04/08/23 0742 04/08/23 0913        MEDICATIONS: Scheduled Meds:  arformoterol  15 mcg Nebulization BID   Chlorhexidine Gluconate Cloth  6 each Topical Daily   enoxaparin (LOVENOX) injection  40 mg Subcutaneous Daily   feeding supplement   237 mL Oral BID BM   feeding supplement (PROSource TF20)  60 mL Per Tube Daily   folic acid  1 mg Per Tube Daily   free water  100 mL Per Tube Q4H   furosemide  20 mg Oral Daily   gabapentin  300 mg Per Tube Daily   Gerhardt's butt cream   Topical BID   hydrocortisone  10 mg Per Tube Daily   hydrocortisone  5 mg Per Tube Daily   insulin aspart  0-15 Units Subcutaneous Q4H   lactulose  20 g Per Tube TID   midodrine  5 mg Per Tube Q8H   multivitamin with minerals  1 tablet Per Tube Daily   pantoprazole  40 mg Intravenous Q12H   potassium chloride  20 mEq Per Tube BID   QUEtiapine  75 mg Per Tube QHS   revefenacin  175 mcg Nebulization Daily   rifaximin  550 mg Per Tube BID   rosuvastatin  40 mg Per Tube Daily  sodium chloride flush  10-40 mL Intracatheter Q12H   spironolactone  12.5 mg Per Tube Daily   thiamine  100 mg Per Tube Daily   Continuous Infusions:  sodium chloride Stopped (04/17/23 0000)   feeding supplement (VITAL 1.5 CAL) Stopped (04/24/23 0830)   PRN Meds:.sodium chloride, ALPRAZolam, docusate sodium, ipratropium-albuterol, mouth rinse, mouth rinse, oxyCODONE, polyethylene glycol, sodium chloride flush   I have personally reviewed following labs and imaging studies  LABORATORY DATA: CBC: Recent Labs  Lab 04/19/23 0330 04/20/23 1423 04/22/23 0442 04/23/23 0411 04/24/23 0642  WBC 15.7* 19.0* 13.0* 10.6* 9.4  HGB 10.0* 11.1* 10.2* 10.1* 9.3*  HCT 31.2* 35.7* 31.0* 31.8* 29.4*  MCV 91.5 92.0 89.1 90.6 91.3  PLT 137* 167 174 183 207     Basic Metabolic Panel: Recent Labs  Lab 04/18/23 0433 04/19/23 0330 04/20/23 1423 04/22/23 0442 04/23/23 0411 04/24/23 0642  NA 141 137 135 131* 131* 133*  K 3.9 3.9 3.8 4.3 4.4 4.1  CL 113* 113* 111 107 107 109  CO2 20* 18* 17* 17* 18* 18*  GLUCOSE 139* 136* 144* 191* 186* 210*  BUN 6 7 10 13 13 10   CREATININE 0.57* 0.61 0.65 0.76 0.67 0.67  CALCIUM 8.4* 8.0* 8.4* 8.2* 8.1* 8.3*  MG 2.0 2.0 1.9 2.2  --   --    PHOS 2.3* 2.4* 2.5 2.0*  --   --      GFR: Estimated Creatinine Clearance: 110.6 mL/min (by C-G formula based on SCr of 0.67 mg/dL).  Liver Function Tests: No results for input(s): "AST", "ALT", "ALKPHOS", "BILITOT", "PROT", "ALBUMIN" in the last 168 hours.  No results for input(s): "LIPASE", "AMYLASE" in the last 168 hours.  Recent Labs  Lab 04/19/23 0330 04/20/23 1321 04/22/23 0442 04/23/23 0411 04/24/23 0642  AMMONIA 72* 62* 74* 66* 42*     Coagulation Profile: No results for input(s): "INR", "PROTIME" in the last 168 hours.  Cardiac Enzymes: No results for input(s): "CKTOTAL", "CKMB", "CKMBINDEX", "TROPONINI" in the last 168 hours.  BNP (last 3 results) No results for input(s): "PROBNP" in the last 8760 hours.  Lipid Profile: No results for input(s): "CHOL", "HDL", "LDLCALC", "TRIG", "CHOLHDL", "LDLDIRECT" in the last 72 hours.  Thyroid Function Tests: No results for input(s): "TSH", "T4TOTAL", "FREET4", "T3FREE", "THYROIDAB" in the last 72 hours.  Anemia Panel: No results for input(s): "VITAMINB12", "FOLATE", "FERRITIN", "TIBC", "IRON", "RETICCTPCT" in the last 72 hours.  Urine analysis:    Component Value Date/Time   COLORURINE YELLOW (A) 02/22/2023 1055   APPEARANCEUR CLEAR (A) 02/22/2023 1055   APPEARANCEUR Clear 03/16/2012 1315   LABSPEC 1.018 02/22/2023 1055   LABSPEC 1.013 03/16/2012 1315   PHURINE 5.0 02/22/2023 1055   GLUCOSEU NEGATIVE 02/22/2023 1055   GLUCOSEU 50 mg/dL 16/09/9603 5409   HGBUR NEGATIVE 02/22/2023 1055   BILIRUBINUR NEGATIVE 02/22/2023 1055   BILIRUBINUR Negative 03/16/2012 1315   KETONESUR 5 (A) 02/22/2023 1055   PROTEINUR NEGATIVE 02/22/2023 1055   NITRITE NEGATIVE 02/22/2023 1055   LEUKOCYTESUR NEGATIVE 02/22/2023 1055   LEUKOCYTESUR Negative 03/16/2012 1315    Sepsis Labs: Lactic Acid, Venous    Component Value Date/Time   LATICACIDVEN 2.3 (HH) 04/07/2023 2208    MICROBIOLOGY: No results found for this or any  previous visit (from the past 240 hour(s)).   RADIOLOGY STUDIES/RESULTS: No results found.   LOS: 17 days   Jeoffrey Massed, MD  [facility]    To contact the attending provider between 7A-7P or the covering provider during after  hours 7P-7A, please log into the web site [email or web address] and access using universal [place] password for that web site. If you do not have the password, please call the hospital operator.  04/24/2023, 11:08 AM

## 2023-04-25 LAB — GLUCOSE, CAPILLARY
Glucose-Capillary: 189 mg/dL — ABNORMAL HIGH (ref 70–99)
Glucose-Capillary: 169 mg/dL — ABNORMAL HIGH (ref 70–99)
Glucose-Capillary: 143 mg/dL — ABNORMAL HIGH (ref 70–99)
Glucose-Capillary: 159 mg/dL — ABNORMAL HIGH (ref 70–99)
Glucose-Capillary: 228 mg/dL — ABNORMAL HIGH (ref 70–99)

## 2023-04-25 MED ORDER — ROSUVASTATIN CALCIUM 20 MG PO TABS
40.0000 mg | ORAL_TABLET | Freq: Every day | ORAL | Status: DC
  Administered 2023-04-26 – 2023-04-29 (×4): 40 mg via ORAL
  Filled 2023-04-25 (×4): qty 2

## 2023-04-25 MED ORDER — ADULT MULTIVITAMIN W/MINERALS CH
1.0000 | ORAL_TABLET | Freq: Every day | ORAL | Status: DC
  Administered 2023-04-26 – 2023-04-29 (×4): 1 via ORAL
  Filled 2023-04-25 (×4): qty 1

## 2023-04-25 MED ORDER — SPIRONOLACTONE 12.5 MG HALF TABLET
12.5000 mg | ORAL_TABLET | Freq: Every day | ORAL | Status: DC
  Administered 2023-04-26 – 2023-04-29 (×4): 12.5 mg via ORAL
  Filled 2023-04-25 (×4): qty 1

## 2023-04-25 MED ORDER — HYDROCORTISONE 10 MG PO TABS
10.0000 mg | ORAL_TABLET | Freq: Every day | ORAL | Status: DC
  Administered 2023-04-26 – 2023-04-29 (×4): 10 mg via ORAL
  Filled 2023-04-25 (×4): qty 1

## 2023-04-25 MED ORDER — PANTOPRAZOLE SODIUM 40 MG PO TBEC
40.0000 mg | DELAYED_RELEASE_TABLET | Freq: Two times a day (BID) | ORAL | Status: DC
  Administered 2023-04-25 – 2023-04-29 (×8): 40 mg via ORAL
  Filled 2023-04-25 (×7): qty 1

## 2023-04-25 MED ORDER — THIAMINE MONONITRATE 100 MG PO TABS
100.0000 mg | ORAL_TABLET | Freq: Every day | ORAL | Status: DC
  Administered 2023-04-26 – 2023-04-29 (×4): 100 mg via ORAL
  Filled 2023-04-25 (×4): qty 1

## 2023-04-25 MED ORDER — RIFAXIMIN 550 MG PO TABS
550.0000 mg | ORAL_TABLET | Freq: Two times a day (BID) | ORAL | Status: DC
  Administered 2023-04-25 – 2023-04-29 (×8): 550 mg via ORAL
  Filled 2023-04-25 (×8): qty 1

## 2023-04-25 MED ORDER — MIDODRINE HCL 5 MG PO TABS
5.0000 mg | ORAL_TABLET | Freq: Three times a day (TID) | ORAL | Status: DC
  Administered 2023-04-26 – 2023-04-29 (×9): 5 mg via ORAL
  Filled 2023-04-25 (×9): qty 1

## 2023-04-25 MED ORDER — FOLIC ACID 1 MG PO TABS
1.0000 mg | ORAL_TABLET | Freq: Every day | ORAL | Status: DC
  Administered 2023-04-26 – 2023-04-29 (×4): 1 mg via ORAL
  Filled 2023-04-25 (×4): qty 1

## 2023-04-25 MED ORDER — QUETIAPINE FUMARATE 50 MG PO TABS
75.0000 mg | ORAL_TABLET | Freq: Every day | ORAL | Status: DC
  Administered 2023-04-25 – 2023-04-28 (×4): 75 mg via ORAL
  Filled 2023-04-25 (×4): qty 1

## 2023-04-25 MED ORDER — GABAPENTIN 300 MG PO CAPS
300.0000 mg | ORAL_CAPSULE | Freq: Every day | ORAL | Status: DC
  Administered 2023-04-26 – 2023-04-29 (×4): 300 mg via ORAL
  Filled 2023-04-25 (×4): qty 1

## 2023-04-25 MED ORDER — HYDROCORTISONE 5 MG PO TABS
5.0000 mg | ORAL_TABLET | Freq: Every day | ORAL | Status: DC
  Administered 2023-04-26 – 2023-04-28 (×3): 5 mg via ORAL
  Filled 2023-04-25 (×4): qty 1

## 2023-04-25 MED ORDER — LACTULOSE 10 GM/15ML PO SOLN
20.0000 g | Freq: Three times a day (TID) | ORAL | Status: DC
  Administered 2023-04-25 – 2023-04-27 (×6): 20 g via ORAL
  Filled 2023-04-25 (×7): qty 30

## 2023-04-25 MED ORDER — POTASSIUM CHLORIDE 20 MEQ PO PACK
20.0000 meq | PACK | Freq: Two times a day (BID) | ORAL | Status: DC
  Administered 2023-04-25 – 2023-04-28 (×7): 20 meq via ORAL
  Filled 2023-04-25 (×8): qty 1

## 2023-04-25 NOTE — Progress Notes (Signed)
RE: Ethan Wells Date of Birth: 2071-09-25 Date: 04/25/23  Please be advised that the above-named patient will require a short-term nursing home stay - anticipated 30 days or less for rehabilitation and strengthening.  The plan is for return home.

## 2023-04-25 NOTE — Plan of Care (Signed)
  Problem: Education: Goal: Knowledge of General Education information will improve Description: Including pain rating scale, medication(s)/side effects and non-pharmacologic comfort measures Outcome: Progressing   Problem: Health Behavior/Discharge Planning: Goal: Ability to manage health-related needs will improve Outcome: Progressing   Problem: Clinical Measurements: Goal: Ability to maintain clinical measurements within normal limits will improve Outcome: Progressing Goal: Will remain free from infection Outcome: Progressing Goal: Diagnostic test results will improve Outcome: Progressing Goal: Respiratory complications will improve Outcome: Progressing Goal: Cardiovascular complication will be avoided Outcome: Progressing   Problem: Activity: Goal: Risk for activity intolerance will decrease Outcome: Progressing   Problem: Nutrition: Goal: Adequate nutrition will be maintained Outcome: Progressing   Problem: Coping: Goal: Level of anxiety will decrease Outcome: Progressing   Problem: Elimination: Goal: Will not experience complications related to bowel motility Outcome: Progressing Goal: Will not experience complications related to urinary retention Outcome: Progressing   Problem: Pain Managment: Goal: General experience of comfort will improve Outcome: Progressing   Problem: Safety: Goal: Ability to remain free from injury will improve Outcome: Progressing   Problem: Skin Integrity: Goal: Risk for impaired skin integrity will decrease Outcome: Progressing   Problem: Education: Goal: Ability to describe self-care measures that may prevent or decrease complications (Diabetes Survival Skills Education) will improve Outcome: Progressing Goal: Individualized Educational Video(s) Outcome: Progressing   Problem: Coping: Goal: Ability to adjust to condition or change in health will improve Outcome: Progressing   Problem: Fluid Volume: Goal: Ability to  maintain a balanced intake and output will improve Outcome: Progressing   Problem: Health Behavior/Discharge Planning: Goal: Ability to identify and utilize available resources and services will improve Outcome: Progressing Goal: Ability to manage health-related needs will improve Outcome: Progressing   Problem: Metabolic: Goal: Ability to maintain appropriate glucose levels will improve Outcome: Progressing   Problem: Nutritional: Goal: Maintenance of adequate nutrition will improve Outcome: Progressing Goal: Progress toward achieving an optimal weight will improve Outcome: Progressing   Problem: Skin Integrity: Goal: Risk for impaired skin integrity will decrease Outcome: Progressing   Problem: Tissue Perfusion: Goal: Adequacy of tissue perfusion will improve Outcome: Progressing   Problem: Education: Goal: Understanding of CV disease, CV risk reduction, and recovery process will improve Outcome: Progressing Goal: Individualized Educational Video(s) Outcome: Progressing   Problem: Activity: Goal: Ability to return to baseline activity level will improve Outcome: Progressing   Problem: Cardiovascular: Goal: Ability to achieve and maintain adequate cardiovascular perfusion will improve Outcome: Progressing Goal: Vascular access site(s) Level 0-1 will be maintained Outcome: Progressing   Problem: Health Behavior/Discharge Planning: Goal: Ability to safely manage health-related needs after discharge will improve Outcome: Progressing   Problem: Safety: Goal: Non-violent Restraint(s) Outcome: Progressing   Problem: Respiratory: Goal: Ability to maintain a clear airway and adequate ventilation will improve Outcome: Progressing   Problem: Role Relationship: Goal: Method of communication will improve Outcome: Progressing   

## 2023-04-25 NOTE — TOC Progression Note (Signed)
Transition of Care [facility]) - Progression Note    Patient Details  Name: Ethan Wells MRN: [id number] Date of Birth: Aug 16, 1971  Transition of Care [facility]) CM/SW Contact  Mearl Latin, LCSW Phone Number: 04/25/2023, 10:11 AM  Clinical Narrative:    CSW following for medical progression. Placement barrier includes cortrak and substance use.     Barriers to Discharge: Continued Medical Work up (Substance use)  Expected Discharge Plan and Services                                               Social Determinants of Health (SDOH) Interventions SDOH Screenings   Food Insecurity: No Food Insecurity (04/07/2023)  Housing: Low Risk  (04/07/2023)  Transportation Needs: No Transportation Needs (04/07/2023)  Utilities: Not At Risk (04/07/2023)  Alcohol Screen: Medium Risk (10/01/2022)  Tobacco Use: High Risk (04/13/2023)    Readmission Risk Interventions    02/24/2023    3:12 PM 01/17/2023    3:38 PM 10/22/2022   10:47 AM  Readmission Risk Prevention Plan  Transportation Screening Complete Complete Complete  PCP or Specialist Appt within 3-5 Days   Complete  Social Work Consult for Recovery Care Planning/Counseling   Complete  Palliative Care Screening   Not Applicable  Medication Review Oceanographer) Complete Complete Complete  SW Recovery Care/Counseling Consult  Not Complete   SW Consult Not Complete Comments  lethargic   Palliative Care Screening Not Applicable Not Applicable   Skilled Nursing Facility Not Applicable Not Applicable

## 2023-04-25 NOTE — Progress Notes (Signed)
PROGRESS NOTE        PATIENT DETAILS Name: Ethan Wells Age: 52 y.o. Sex: male Date of Birth: Dec 09, 1971 Admit Date: 04/07/2023 Admitting Physician Starleen Arms, MD [id number], Margit Banda, NP  Brief Summary: Patient is a 52 y.o.  male with history of polysubstance abuse/EtOH use, small amount of sludge within the gallbladder.  Who presented with GI bleeding-underwent EGD with banding-Hospital course complicated by acute hypoxic respiratory failure requiring intubation.  See below for further details.  Significant events: 4/22>> admitted with acute GI bleed  4/23>> EGD >>  grade 2 varices, banded x 2, portal gastropathy 4/23 >>intubated 4/25>> extubated 4/27>> TIPS, re intubated for procedure. Did not meet extubation criteria and was returned to ICU on vent.  4/30>> NGT placed, extubated 5/02 >> transferred to [facility] 5/06>> MBS evaluation, started on regular diet  Significant studies: 4/26>> TTE: EF 55-60%, grade 2 diastolic dysfunction 5/02>> RUQ ultrasound: Cirrhosis-small amount of sludge in gallbladder.  Significant microbiology data: 4/28>> tracheal aspirate: No organisms  Procedures: 4/23>> EGD 4/27>> TIPS  Consults: PCCM GI IR  Subjective: Awake-eating breakfast this morning.  No major issues overnight.  Objective: Vitals: Blood pressure (!) 103/54, pulse 92, temperature 98.4 F (36.9 C), temperature source Oral, resp. rate (!) 24, height 5\' 5"  (1.651 m), weight 84.7 kg, SpO2 100 %.   Exam: Gen Exam:Alert awake-not in any distress HEENT:atraumatic, normocephalic Chest: B/L clear to auscultation anteriorly CVS:S1S2 regular Abdomen:soft non tender, non distended Extremities:no edema Neurology: Non focal Skin: no rash  Pertinent Labs/Radiology:    Latest Ref Rng & Units 04/24/2023    6:42 AM 04/23/2023    4:11 AM 04/22/2023    4:42 AM  CBC  WBC 4.0 - 10.5 K/uL 9.4  10.6  13.0   Hemoglobin 13.0 - 17.0 g/dL 9.3  91.4   78.2   Hematocrit 39.0 - 52.0 % 29.4  31.8  31.0   Platelets 150 - 400 K/uL 207  183  174     Lab Results  Component Value Date   NA 133 (L) 04/24/2023   K 4.1 04/24/2023   CL 109 04/24/2023   CO2 18 (L) 04/24/2023      Assessment/Plan: Acute hypoxic respiratory failure Resolved-on room air Per prior chart review-thought to have acute pulmonary edema-related to transfusion.  Upper GI bleed-secondary to esophageal varices/portal gastropathy Acute blood loss anemia in the setting of GI bleeding S/p EGD with banding on 4/23 and TIPS procedure on 4/27 GI bleeding seems to have resolved Hb stable Continue PPI Follow CBC periodically.  Acute hepatic encephalopathy Overall better but still waxing and waning at times Continue rifaximin/lactulose.    Alcoholic liver cirrhosis-with portal gastropathy, hypersplenism induced thrombocytopenia Volume status relatively stable Continue Lasix/Aldactone  Chronic HFpEF Volume status relatively stable Continue diuretics  History of CAD-s/p STEMI with V-fib arrest 2012-s/p LAD PCI Non-STEMI 11/04/2022-LHC with patent LAD stent-medical management recommended by cardiology Not on antiplatelets given severity of GI bleeding Continue statin  Adrenal insufficiency Continue hydrocortisone  Mood disorder Seroquel  EtOH use Polysubstance abuse Drug screen positive for benzo's/cocaine Should be out of window for withdrawal symptoms  Deconditioning related to acute illness Oropharyngeal dysphagia related to acute illness/muscular deconditioning Oral intake improving-has been switched to nocturnal tube feeds PT/OT-SNF recommended.  Per social work-he will be difficult to place.  Nutrition Status: Nutrition Problem: Inadequate oral intake  Etiology: inability to eat Signs/Symptoms: NPO status Interventions: Ensure Enlive (each supplement provides 350kcal and 20 grams of protein), MVI, Tube feeding, Prostat  Obesity: Estimated body  mass index is 31.07 kg/m as calculated from the following:   Height as of this encounter: 5\' 5"  (1.651 m).   Weight as of this encounter: 84.7 kg.   Code status:   Code Status: Full Code   DVT Prophylaxis: enoxaparin (LOVENOX) injection 40 mg Start: 04/15/23 1115 Place TED hose Start: 04/13/23 1047 SCDs Start: 04/07/23 2045    Family Communication: None at bedside   Disposition Plan: Status is: Inpatient Remains inpatient appropriate because: Severity of illness   Planned Discharge Destination:Skilled nursing facility   Diet: Diet Order             Diet regular Room service appropriate? Yes with Assist; Fluid consistency: Thin  Diet effective now                     Antimicrobial agents: Anti-infectives (From admission, onward)    Start     Dose/Rate Route Frequency Ordered Stop   04/20/23 1230  rifaximin (XIFAXAN) tablet 550 mg        550 mg Per Tube 2 times daily 04/20/23 1132     04/11/23 1045  vancomycin (VANCOCIN) IVPB 1000 mg/200 mL premix        1,000 mg 200 mL/hr over 60 Minutes Intravenous  Once 04/11/23 0952 04/11/23 1211   04/09/23 0830  cefTRIAXone (ROCEPHIN) 1 g in sodium chloride 0.9 % 100 mL IVPB  Status:  Discontinued        1 g 200 mL/hr over 30 Minutes Intravenous Every 24 hours 04/08/23 0913 04/11/23 1133   04/08/23 1400  cefTRIAXone (ROCEPHIN) 2 g in sodium chloride 0.9 % 100 mL IVPB  Status:  Discontinued        2 g 200 mL/hr over 30 Minutes Intravenous Every 24 hours 04/07/23 2016 04/08/23 0742   04/08/23 0800  cefTRIAXone (ROCEPHIN) 2 g in sodium chloride 0.9 % 100 mL IVPB  Status:  Discontinued        2 g 200 mL/hr over 30 Minutes Intravenous Every 24 hours 04/08/23 0742 04/08/23 0913        MEDICATIONS: Scheduled Meds:  arformoterol  15 mcg Nebulization BID   Chlorhexidine Gluconate Cloth  6 each Topical Daily   enoxaparin (LOVENOX) injection  40 mg Subcutaneous Daily   feeding supplement  1 Container Oral TID BM   feeding  supplement (OSMOLITE 1.5 CAL)  1,000 mL Per Tube Q24H   feeding supplement (PROSource TF20)  60 mL Per Tube Daily   folic acid  1 mg Per Tube Daily   free water  100 mL Per Tube Q4H   furosemide  20 mg Oral Daily   gabapentin  300 mg Per Tube Daily   Gerhardt's butt cream   Topical BID   hydrocortisone  10 mg Per Tube Daily   hydrocortisone  5 mg Per Tube Daily   insulin aspart  0-15 Units Subcutaneous Q4H   lactulose  20 g Per Tube TID   midodrine  5 mg Per Tube Q8H   multivitamin with minerals  1 tablet Per Tube Daily   pantoprazole  40 mg Intravenous Q12H   potassium chloride  20 mEq Per Tube BID   QUEtiapine  75 mg Per Tube QHS   revefenacin  175 mcg Nebulization Daily   rifaximin  550 mg Per Tube BID  rosuvastatin  40 mg Per Tube Daily   sodium chloride flush  10-40 mL Intracatheter Q12H   spironolactone  12.5 mg Per Tube Daily   thiamine  100 mg Per Tube Daily   Continuous Infusions:  sodium chloride Stopped (04/17/23 0000)   PRN Meds:.sodium chloride, ALPRAZolam, docusate sodium, ipratropium-albuterol, mouth rinse, mouth rinse, oxyCODONE, polyethylene glycol, sodium chloride flush   I have personally reviewed following labs and imaging studies  LABORATORY DATA: CBC: Recent Labs  Lab 04/19/23 0330 04/20/23 1423 04/22/23 0442 04/23/23 0411 04/24/23 0642  WBC 15.7* 19.0* 13.0* 10.6* 9.4  HGB 10.0* 11.1* 10.2* 10.1* 9.3*  HCT 31.2* 35.7* 31.0* 31.8* 29.4*  MCV 91.5 92.0 89.1 90.6 91.3  PLT 137* 167 174 183 207     Basic Metabolic Panel: Recent Labs  Lab 04/19/23 0330 04/20/23 1423 04/22/23 0442 04/23/23 0411 04/24/23 0642  NA 137 135 131* 131* 133*  K 3.9 3.8 4.3 4.4 4.1  CL 113* 111 107 107 109  CO2 18* 17* 17* 18* 18*  GLUCOSE 136* 144* 191* 186* 210*  BUN 7 10 13 13 10   CREATININE 0.61 0.65 0.76 0.67 0.67  CALCIUM 8.0* 8.4* 8.2* 8.1* 8.3*  MG 2.0 1.9 2.2  --   --   PHOS 2.4* 2.5 2.0*  --   --      GFR: Estimated Creatinine Clearance: 110.6  mL/min (by C-G formula based on SCr of 0.67 mg/dL).  Liver Function Tests: No results for input(s): "AST", "ALT", "ALKPHOS", "BILITOT", "PROT", "ALBUMIN" in the last 168 hours.  No results for input(s): "LIPASE", "AMYLASE" in the last 168 hours.  Recent Labs  Lab 04/19/23 0330 04/20/23 1321 04/22/23 0442 04/23/23 0411 04/24/23 0642  AMMONIA 72* 62* 74* 66* 42*     Coagulation Profile: No results for input(s): "INR", "PROTIME" in the last 168 hours.  Cardiac Enzymes: No results for input(s): "CKTOTAL", "CKMB", "CKMBINDEX", "TROPONINI" in the last 168 hours.  BNP (last 3 results) No results for input(s): "PROBNP" in the last 8760 hours.  Lipid Profile: No results for input(s): "CHOL", "HDL", "LDLCALC", "TRIG", "CHOLHDL", "LDLDIRECT" in the last 72 hours.  Thyroid Function Tests: No results for input(s): "TSH", "T4TOTAL", "FREET4", "T3FREE", "THYROIDAB" in the last 72 hours.  Anemia Panel: No results for input(s): "VITAMINB12", "FOLATE", "FERRITIN", "TIBC", "IRON", "RETICCTPCT" in the last 72 hours.  Urine analysis:    Component Value Date/Time   COLORURINE YELLOW (A) 02/22/2023 1055   APPEARANCEUR CLEAR (A) 02/22/2023 1055   APPEARANCEUR Clear 03/16/2012 1315   LABSPEC 1.018 02/22/2023 1055   LABSPEC 1.013 03/16/2012 1315   PHURINE 5.0 02/22/2023 1055   GLUCOSEU NEGATIVE 02/22/2023 1055   GLUCOSEU 50 mg/dL 16/09/9603 5409   HGBUR NEGATIVE 02/22/2023 1055   BILIRUBINUR NEGATIVE 02/22/2023 1055   BILIRUBINUR Negative 03/16/2012 1315   KETONESUR 5 (A) 02/22/2023 1055   PROTEINUR NEGATIVE 02/22/2023 1055   NITRITE NEGATIVE 02/22/2023 1055   LEUKOCYTESUR NEGATIVE 02/22/2023 1055   LEUKOCYTESUR Negative 03/16/2012 1315    Sepsis Labs: Lactic Acid, Venous    Component Value Date/Time   LATICACIDVEN 2.3 (HH) 04/07/2023 2208    MICROBIOLOGY: No results found for this or any previous visit (from the past 240 hour(s)).   RADIOLOGY STUDIES/RESULTS: No results  found.   LOS: 18 days   Jeoffrey Massed, MD  [facility]    To contact the attending provider between 7A-7P or the covering provider during after hours 7P-7A, please log into the web site [email or web address] and access using universal  [place] password for that web site. If you do not have the password, please call the hospital operator.  04/25/2023, 11:56 AM

## 2023-04-26 LAB — COMPREHENSIVE METABOLIC PANEL
ALT: 33 U/L (ref 0–44)
AST: 44 U/L — ABNORMAL HIGH (ref 15–41)
Albumin: 2.1 g/dL — ABNORMAL LOW (ref 3.5–5.0)
Alkaline Phosphatase: 160 U/L — ABNORMAL HIGH (ref 38–126)
Anion gap: 8 (ref 5–15)
BUN: 8 mg/dL (ref 6–20)
CO2: 20 mmol/L — ABNORMAL LOW (ref 22–32)
Calcium: 8.5 mg/dL — ABNORMAL LOW (ref 8.9–10.3)
Chloride: 106 mmol/L (ref 98–111)
Creatinine, Ser: 0.61 mg/dL (ref 0.61–1.24)
GFR, Estimated: >60 mL/min (ref >60)
Glucose, Bld: 169 mg/dL — ABNORMAL HIGH (ref 70–99)
Potassium: 3.7 mmol/L (ref 3.5–5.1)
Sodium: 134 mmol/L — ABNORMAL LOW (ref 135–145)
Total Bilirubin: 1.5 mg/dL — ABNORMAL HIGH (ref 0.3–1.2)
Total Protein: 6.7 g/dL (ref 6.5–8.1)

## 2023-04-26 LAB — CBC
HCT: 28.9 % — ABNORMAL LOW (ref 39.0–52.0)
Hemoglobin: 9.3 g/dL — ABNORMAL LOW (ref 13.0–17.0)
MCH: 28.9 pg (ref 26.0–34.0)
MCHC: 32.2 g/dL (ref 30.0–36.0)
MCV: 89.8 fL (ref 80.0–100.0)
Platelets: 232 10*3/uL (ref 150–400)
RBC: 3.22 MIL/uL — ABNORMAL LOW (ref 4.22–5.81)
RDW: 18.1 % — ABNORMAL HIGH (ref 11.5–15.5)
WBC: 8.1 10*3/uL (ref 4.0–10.5)
nRBC: 0 % (ref 0.0–0.2)

## 2023-04-26 LAB — GLUCOSE, CAPILLARY
Glucose-Capillary: 192 mg/dL — ABNORMAL HIGH (ref 70–99)
Glucose-Capillary: 152 mg/dL — ABNORMAL HIGH (ref 70–99)
Glucose-Capillary: 177 mg/dL — ABNORMAL HIGH (ref 70–99)
Glucose-Capillary: 243 mg/dL — ABNORMAL HIGH (ref 70–99)
Glucose-Capillary: 119 mg/dL — ABNORMAL HIGH (ref 70–99)

## 2023-04-26 LAB — AMMONIA: Ammonia: 42 umol/L — ABNORMAL HIGH (ref 9–35)

## 2023-04-26 MED ORDER — INSULIN ASPART 100 UNIT/ML IJ SOLN: 0.0000 [IU] | Freq: Three times a day (TID) | INTRAMUSCULAR | Status: DC

## 2023-04-26 MED ORDER — INSULIN ASPART 100 UNIT/ML IJ SOLN
0.0000 [IU] | Freq: Three times a day (TID) | INTRAMUSCULAR | Status: DC
  Administered 2023-04-27 (×2): 2 [IU] via SUBCUTANEOUS
  Administered 2023-04-27: 1 [IU] via SUBCUTANEOUS
  Administered 2023-04-28 (×2): 2 [IU] via SUBCUTANEOUS

## 2023-04-26 NOTE — Plan of Care (Signed)
  Problem: Education: Goal: Knowledge of General Education information will improve Description: Including pain rating scale, medication(s)/side effects and non-pharmacologic comfort measures Outcome: Not Met (add Reason)   Problem: Health Behavior/Discharge Planning: Goal: Ability to manage health-related needs will improve Outcome: Not Met (add Reason)   Problem: Clinical Measurements: Goal: Ability to maintain clinical measurements within normal limits will improve Outcome: Not Met (add Reason) Goal: Will remain free from infection Outcome: Not Met (add Reason) Goal: Diagnostic test results will improve Outcome: Not Met (add Reason) Goal: Respiratory complications will improve Outcome: Not Met (add Reason) Goal: Cardiovascular complication will be avoided Outcome: Not Met (add Reason)   Problem: Activity: Goal: Risk for activity intolerance will decrease Outcome: Not Met (add Reason)   Problem: Nutrition: Goal: Adequate nutrition will be maintained Outcome: Not Met (add Reason)   Problem: Coping: Goal: Level of anxiety will decrease Outcome: Not Met (add Reason)   Problem: Elimination: Goal: Will not experience complications related to bowel motility Outcome: Not Met (add Reason) Goal: Will not experience complications related to urinary retention Outcome: Not Met (add Reason)   Problem: Pain Managment: Goal: General experience of comfort will improve Outcome: Not Met (add Reason)   Problem: Safety: Goal: Ability to remain free from injury will improve Outcome: Not Met (add Reason)   Problem: Skin Integrity: Goal: Risk for impaired skin integrity will decrease Outcome: Not Met (add Reason)   Problem: Education: Goal: Ability to describe self-care measures that may prevent or decrease complications (Diabetes Survival Skills Education) will improve Outcome: Not Met (add Reason) Goal: Individualized Educational Video(s) Outcome: Not Met (add Reason)   Problem:  Coping: Goal: Ability to adjust to condition or change in health will improve Outcome: Not Met (add Reason)   Problem: Fluid Volume: Goal: Ability to maintain a balanced intake and output will improve Outcome: Not Met (add Reason)   Problem: Health Behavior/Discharge Planning: Goal: Ability to identify and utilize available resources and services will improve Outcome: Not Met (add Reason) Goal: Ability to manage health-related needs will improve Outcome: Not Met (add Reason)   Problem: Metabolic: Goal: Ability to maintain appropriate glucose levels will improve Outcome: Not Met (add Reason)   Problem: Nutritional: Goal: Maintenance of adequate nutrition will improve Outcome: Not Met (add Reason) Goal: Progress toward achieving an optimal weight will improve Outcome: Not Met (add Reason)   Problem: Skin Integrity: Goal: Risk for impaired skin integrity will decrease Outcome: Not Met (add Reason)   Problem: Tissue Perfusion: Goal: Adequacy of tissue perfusion will improve Outcome: Not Met (add Reason)   Problem: Education: Goal: Understanding of CV disease, CV risk reduction, and recovery process will improve Outcome: Not Met (add Reason) Goal: Individualized Educational Video(s) Outcome: Not Met (add Reason)   Problem: Activity: Goal: Ability to return to baseline activity level will improve Outcome: Not Met (add Reason)   Problem: Cardiovascular: Goal: Ability to achieve and maintain adequate cardiovascular perfusion will improve Outcome: Not Met (add Reason) Goal: Vascular access site(s) Level 0-1 will be maintained Outcome: Not Met (add Reason)   Problem: Health Behavior/Discharge Planning: Goal: Ability to safely manage health-related needs after discharge will improve Outcome: Not Met (add Reason)   Problem: Safety: Goal: Non-violent Restraint(s) Outcome: Not Met (add Reason)   Problem: Respiratory: Goal: Ability to maintain a clear airway and adequate  ventilation will improve Outcome: Not Met (add Reason)   Problem: Role Relationship: Goal: Method of communication will improve Outcome: Not Met (add Reason)

## 2023-04-26 NOTE — Progress Notes (Signed)
Pulling telemetry electrodes off throughout noct. Replaced several times, redirected and repositioned. Provider notified of activity. Provider notes that ammonia level will be reassessed/updated to determine if behaviors reflect changes concerning recent TIPS procedure.  Will continue to monitor.

## 2023-04-26 NOTE — Progress Notes (Signed)
DBIV consult: no blood return from midline. Phlebotomy to draw labs.

## 2023-04-26 NOTE — Progress Notes (Signed)
Mobility Specialist Progress Note    04/26/23 1450  Mobility  Activity Ambulated with assistance in hallway  Level of Assistance Minimal assist, patient does 75% or more  Assistive Device Front wheel walker  Distance Ambulated (ft) 60 ft  Activity Response Tolerated well  Mobility Referral Yes  $Mobility charge 1 Mobility  Mobility Specialist Start Time (ACUTE ONLY) 1412  Mobility Specialist Stop Time (ACUTE ONLY) 1449  Mobility Specialist Time Calculation (min) (ACUTE ONLY) 37 min   Pre-Mobility: 99 HR During Mobility: 118 HR Post-Mobility: 109 HR  Pt received in bed and agreeable. Pt took extended time to sit EOB. No complaints on walk. Pt did stop and lean down onto RW as a rest break for ~2 minutes. Returned to chair with call bell in reach. Alarm on.   [place] Mobility Specialist  Please Contact via SecureChat or Rehab Office at [phone]

## 2023-04-26 NOTE — Plan of Care (Signed)
  Problem: Education: Goal: Knowledge of General Education information will improve Description: Including pain rating scale, medication(s)/side effects and non-pharmacologic comfort measures Outcome: Progressing   Problem: Health Behavior/Discharge Planning: Goal: Ability to manage health-related needs will improve Outcome: Progressing   Problem: Clinical Measurements: Goal: Ability to maintain clinical measurements within normal limits will improve Outcome: Progressing Goal: Will remain free from infection Outcome: Progressing Goal: Diagnostic test results will improve Outcome: Progressing Goal: Respiratory complications will improve Outcome: Progressing Goal: Cardiovascular complication will be avoided Outcome: Progressing   Problem: Activity: Goal: Risk for activity intolerance will decrease Outcome: Progressing   Problem: Nutrition: Goal: Adequate nutrition will be maintained Outcome: Progressing   Problem: Coping: Goal: Level of anxiety will decrease Outcome: Progressing   Problem: Elimination: Goal: Will not experience complications related to bowel motility Outcome: Progressing Goal: Will not experience complications related to urinary retention Outcome: Progressing   Problem: Pain Managment: Goal: General experience of comfort will improve Outcome: Progressing   Problem: Safety: Goal: Ability to remain free from injury will improve Outcome: Progressing   Problem: Skin Integrity: Goal: Risk for impaired skin integrity will decrease Outcome: Progressing   Problem: Education: Goal: Ability to describe self-care measures that may prevent or decrease complications (Diabetes Survival Skills Education) will improve Outcome: Progressing Goal: Individualized Educational Video(s) Outcome: Progressing   Problem: Coping: Goal: Ability to adjust to condition or change in health will improve Outcome: Progressing   Problem: Fluid Volume: Goal: Ability to  maintain a balanced intake and output will improve Outcome: Progressing   Problem: Health Behavior/Discharge Planning: Goal: Ability to identify and utilize available resources and services will improve Outcome: Progressing Goal: Ability to manage health-related needs will improve Outcome: Progressing   Problem: Metabolic: Goal: Ability to maintain appropriate glucose levels will improve Outcome: Progressing   Problem: Nutritional: Goal: Maintenance of adequate nutrition will improve Outcome: Progressing Goal: Progress toward achieving an optimal weight will improve Outcome: Progressing   Problem: Skin Integrity: Goal: Risk for impaired skin integrity will decrease Outcome: Progressing   Problem: Tissue Perfusion: Goal: Adequacy of tissue perfusion will improve Outcome: Progressing   Problem: Education: Goal: Understanding of CV disease, CV risk reduction, and recovery process will improve Outcome: Progressing Goal: Individualized Educational Video(s) Outcome: Progressing   Problem: Activity: Goal: Ability to return to baseline activity level will improve Outcome: Progressing   Problem: Cardiovascular: Goal: Ability to achieve and maintain adequate cardiovascular perfusion will improve Outcome: Progressing Goal: Vascular access site(s) Level 0-1 will be maintained Outcome: Progressing   Problem: Health Behavior/Discharge Planning: Goal: Ability to safely manage health-related needs after discharge will improve Outcome: Progressing   Problem: Safety: Goal: Non-violent Restraint(s) Outcome: Progressing   Problem: Respiratory: Goal: Ability to maintain a clear airway and adequate ventilation will improve Outcome: Progressing   Problem: Role Relationship: Goal: Method of communication will improve Outcome: Progressing

## 2023-04-26 NOTE — Progress Notes (Signed)
PROGRESS NOTE        PATIENT DETAILS Name: Ethan Wells Age: 52 y.o. Sex: male Date of Birth: 08/15/1971 Admit Date: 04/07/2023 Admitting Physician Starleen Arms, MD ZOX:[id number], Margit Banda, NP  Brief Summary: Patient is a 52 y.o.  male with history of polysubstance abuse/EtOH use, small amount of sludge within the gallbladder.  Who presented with GI bleeding-underwent EGD with banding-Hospital course complicated by acute hypoxic respiratory failure requiring intubation.  See below for further details.  Significant events: 4/22>> admitted with acute GI bleed  4/23>> EGD >>  grade 2 varices, banded x 2, portal gastropathy 4/23 >>intubated 4/25>> extubated 4/27>> TIPS, re intubated for procedure. Did not meet extubation criteria and was returned to ICU on vent.  4/30>> NGT placed, extubated 5/02 >> transferred to [facility] 5/06>> MBS evaluation, started on regular diet  Significant studies: 4/26>> TTE: EF 55-60%, grade 2 diastolic dysfunction 5/02>> RUQ ultrasound: Cirrhosis-small amount of sludge in gallbladder.  Significant microbiology data: 4/28>> tracheal aspirate: No organisms  Procedures: 4/23>> EGD 4/27>> TIPS  Consults: PCCM GI IR  Subjective: Spoke with nursing staff-patient ate around 50% of his meals yesterday-no major issues overnight-patient wants his NG tube removed.  Objective: Vitals: Blood pressure (!) 103/51, pulse 98, temperature 98.4 F (36.9 C), temperature source Oral, resp. rate (!) 22, height 5\' 5"  (1.651 m), weight 84.7 kg, SpO2 96 %.   Exam: Gen Exam:Alert awake-not in any distress HEENT:atraumatic, normocephalic Chest: B/L clear to auscultation anteriorly CVS:S1S2 regular Abdomen:soft non tender, non distended Extremities:no edema Neurology: Non focal Skin: no rash  Pertinent Labs/Radiology:    Latest Ref Rng & Units 04/26/2023    8:07 AM 04/24/2023    6:42 AM 04/23/2023    4:11 AM  CBC  WBC 4.0 -  10.5 K/uL 8.1  9.4  10.6   Hemoglobin 13.0 - 17.0 g/dL 9.3  9.3  91.4   Hematocrit 39.0 - 52.0 % 28.9  29.4  31.8   Platelets 150 - 400 K/uL 232  207  183     Lab Results  Component Value Date   NA 134 (L) 04/26/2023   K 3.7 04/26/2023   CL 106 04/26/2023   CO2 20 (L) 04/26/2023      Assessment/Plan: Acute hypoxic respiratory failure Resolved-on room air Per prior chart review-thought to have acute pulmonary edema-related to transfusion.  Upper GI bleed-secondary to esophageal varices/portal gastropathy Acute blood loss anemia in the setting of GI bleeding S/p EGD with banding on 4/23 and TIPS procedure on 4/27 GI bleeding seems to have resolved Hb stable Continue PPI Follow CBC periodically.  Acute hepatic encephalopathy Continues to slowly improve-much more awake alert today compared to the past few days.  Although mentation continues to wax/wane. Continue rifaximin/lactulose.    Alcoholic liver cirrhosis-with portal gastropathy, hypersplenism induced thrombocytopenia Volume status relatively stable Continue Lasix/Aldactone  Chronic HFpEF Volume status relatively stable Continue diuretics  History of CAD-s/p STEMI with V-fib arrest 2012-s/p LAD PCI Non-STEMI 11/04/2022-LHC with patent LAD stent-medical management recommended by cardiology Not on antiplatelets given severity of GI bleeding Continue statin  Adrenal insufficiency Continue hydrocortisone  Mood disorder Seroquel  EtOH use Polysubstance abuse Drug screen positive for benzo's/cocaine Should be out of window for withdrawal symptoms  Deconditioning related to acute illness Oropharyngeal dysphagia related to acute illness/muscular deconditioning Discussed with patient/nursing staff-significant improvement oral intake-discontinue  NG tube PT/OT-SNF recommended.  Per social work-he will be difficult to place.  Nutrition Status: Nutrition Problem: Inadequate oral intake Etiology: inability to  eat Signs/Symptoms: NPO status Interventions: Ensure Enlive (each supplement provides 350kcal and 20 grams of protein), MVI, Tube feeding, Prostat  Obesity: Estimated body mass index is 31.07 kg/m as calculated from the following:   Height as of this encounter: 5\' 5"  (1.651 m).   Weight as of this encounter: 84.7 kg.   Code status:   Code Status: Full Code   DVT Prophylaxis: enoxaparin (LOVENOX) injection 40 mg Start: 04/15/23 1115 Place TED hose Start: 04/13/23 1047 SCDs Start: 04/07/23 2045    Family Communication: None at bedside   Disposition Plan: Status is: Inpatient Remains inpatient appropriate because: Severity of illness   Planned Discharge Destination:Skilled nursing facility   Diet: Diet Order             Diet regular Room service appropriate? Yes with Assist; Fluid consistency: Thin  Diet effective now                     Antimicrobial agents: Anti-infectives (From admission, onward)    Start     Dose/Rate Route Frequency Ordered Stop   04/25/23 2200  rifaximin (XIFAXAN) tablet 550 mg        550 mg Oral 2 times daily 04/25/23 1436     04/20/23 1230  rifaximin (XIFAXAN) tablet 550 mg  Status:  Discontinued        550 mg Per Tube 2 times daily 04/20/23 1132 04/25/23 1436   04/11/23 1045  vancomycin (VANCOCIN) IVPB 1000 mg/200 mL premix        1,000 mg 200 mL/hr over 60 Minutes Intravenous  Once 04/11/23 0952 04/11/23 1211   04/09/23 0830  cefTRIAXone (ROCEPHIN) 1 g in sodium chloride 0.9 % 100 mL IVPB  Status:  Discontinued        1 g 200 mL/hr over 30 Minutes Intravenous Every 24 hours 04/08/23 0913 04/11/23 1133   04/08/23 1400  cefTRIAXone (ROCEPHIN) 2 g in sodium chloride 0.9 % 100 mL IVPB  Status:  Discontinued        2 g 200 mL/hr over 30 Minutes Intravenous Every 24 hours 04/07/23 2016 04/08/23 0742   04/08/23 0800  cefTRIAXone (ROCEPHIN) 2 g in sodium chloride 0.9 % 100 mL IVPB  Status:  Discontinued        2 g 200 mL/hr over 30 Minutes  Intravenous Every 24 hours 04/08/23 0742 04/08/23 0913        MEDICATIONS: Scheduled Meds:  arformoterol  15 mcg Nebulization BID   Chlorhexidine Gluconate Cloth  6 each Topical Daily   enoxaparin (LOVENOX) injection  40 mg Subcutaneous Daily   feeding supplement  1 Container Oral TID BM   feeding supplement (OSMOLITE 1.5 CAL)  1,000 mL Per Tube Q24H   feeding supplement (PROSource TF20)  60 mL Per Tube Daily   folic acid  1 mg Oral Daily   free water  100 mL Per Tube Q4H   furosemide  20 mg Oral Daily   gabapentin  300 mg Oral Daily   Gerhardt's butt cream   Topical BID   hydrocortisone  10 mg Oral Daily   hydrocortisone  5 mg Oral Daily   insulin aspart  0-15 Units Subcutaneous Q4H   lactulose  20 g Oral TID   midodrine  5 mg Oral Q8H   multivitamin with minerals  1 tablet Oral Daily  pantoprazole  40 mg Oral BID   potassium chloride  20 mEq Oral BID   QUEtiapine  75 mg Oral QHS   revefenacin  175 mcg Nebulization Daily   rifaximin  550 mg Oral BID   rosuvastatin  40 mg Oral Daily   sodium chloride flush  10-40 mL Intracatheter Q12H   spironolactone  12.5 mg Oral Daily   thiamine  100 mg Oral Daily   Continuous Infusions:  sodium chloride Stopped (04/17/23 0000)   PRN Meds:.sodium chloride, ALPRAZolam, docusate sodium, ipratropium-albuterol, mouth rinse, mouth rinse, oxyCODONE, polyethylene glycol, sodium chloride flush   I have personally reviewed following labs and imaging studies  LABORATORY DATA: CBC: Recent Labs  Lab 04/20/23 1423 04/22/23 0442 04/23/23 0411 04/24/23 0642 04/26/23 0807  WBC 19.0* 13.0* 10.6* 9.4 8.1  HGB 11.1* 10.2* 10.1* 9.3* 9.3*  HCT 35.7* 31.0* 31.8* 29.4* 28.9*  MCV 92.0 89.1 90.6 91.3 89.8  PLT 167 174 183 207 232     Basic Metabolic Panel: Recent Labs  Lab 04/20/23 1423 04/22/23 0442 04/23/23 0411 04/24/23 0642 04/26/23 0807  NA 135 131* 131* 133* 134*  K 3.8 4.3 4.4 4.1 3.7  CL 111 107 107 109 106  CO2 17* 17* 18*  18* 20*  GLUCOSE 144* 191* 186* 210* 169*  BUN 10 13 13 10 8   CREATININE 0.65 0.76 0.67 0.67 0.61  CALCIUM 8.4* 8.2* 8.1* 8.3* 8.5*  MG 1.9 2.2  --   --   --   PHOS 2.5 2.0*  --   --   --      GFR: Estimated Creatinine Clearance: 110.6 mL/min (by C-G formula based on SCr of 0.61 mg/dL).  Liver Function Tests: Recent Labs  Lab 04/26/23 0807  AST 44*  ALT 33  ALKPHOS 160*  BILITOT 1.5*  PROT 6.7  ALBUMIN 2.1*    No results for input(s): "LIPASE", "AMYLASE" in the last 168 hours.  Recent Labs  Lab 04/20/23 1321 04/22/23 0442 04/23/23 0411 04/24/23 0642 04/26/23 0807  AMMONIA 62* 74* 66* 42* 42*     Coagulation Profile: No results for input(s): "INR", "PROTIME" in the last 168 hours.  Cardiac Enzymes: No results for input(s): "CKTOTAL", "CKMB", "CKMBINDEX", "TROPONINI" in the last 168 hours.  BNP (last 3 results) No results for input(s): "PROBNP" in the last 8760 hours.  Lipid Profile: No results for input(s): "CHOL", "HDL", "LDLCALC", "TRIG", "CHOLHDL", "LDLDIRECT" in the last 72 hours.  Thyroid Function Tests: No results for input(s): "TSH", "T4TOTAL", "FREET4", "T3FREE", "THYROIDAB" in the last 72 hours.  Anemia Panel: No results for input(s): "VITAMINB12", "FOLATE", "FERRITIN", "TIBC", "IRON", "RETICCTPCT" in the last 72 hours.  Urine analysis:    Component Value Date/Time   COLORURINE YELLOW (A) 02/22/2023 1055   APPEARANCEUR CLEAR (A) 02/22/2023 1055   APPEARANCEUR Clear 03/16/2012 1315   LABSPEC 1.018 02/22/2023 1055   LABSPEC 1.013 03/16/2012 1315   PHURINE 5.0 02/22/2023 1055   GLUCOSEU NEGATIVE 02/22/2023 1055   GLUCOSEU 50 mg/dL 16/09/9603 5409   HGBUR NEGATIVE 02/22/2023 1055   BILIRUBINUR NEGATIVE 02/22/2023 1055   BILIRUBINUR Negative 03/16/2012 1315   KETONESUR 5 (A) 02/22/2023 1055   PROTEINUR NEGATIVE 02/22/2023 1055   NITRITE NEGATIVE 02/22/2023 1055   LEUKOCYTESUR NEGATIVE 02/22/2023 1055   LEUKOCYTESUR Negative 03/16/2012 1315     Sepsis Labs: Lactic Acid, Venous    Component Value Date/Time   LATICACIDVEN 2.3 (HH) 04/07/2023 2208    MICROBIOLOGY: No results found for this or any previous visit (from  the past 240 hour(s)).   RADIOLOGY STUDIES/RESULTS: No results found.   LOS: 19 days   Jeoffrey Massed, MD  [facility]    To contact the attending provider between 7A-7P or the covering provider during after hours 7P-7A, please log into the web site [email or web address] and access using universal [place] password for that web site. If you do not have the password, please call the hospital operator.  04/26/2023, 10:04 AM

## 2023-04-27 LAB — GLUCOSE, CAPILLARY
Glucose-Capillary: 152 mg/dL — ABNORMAL HIGH (ref 70–99)
Glucose-Capillary: 149 mg/dL — ABNORMAL HIGH (ref 70–99)
Glucose-Capillary: 155 mg/dL — ABNORMAL HIGH (ref 70–99)
Glucose-Capillary: 132 mg/dL — ABNORMAL HIGH (ref 70–99)

## 2023-04-27 MED ORDER — LACTULOSE 10 GM/15ML PO SOLN
30.0000 g | Freq: Three times a day (TID) | ORAL | Status: DC
  Administered 2023-04-27 – 2023-04-28 (×4): 30 g via ORAL
  Filled 2023-04-27 (×5): qty 60

## 2023-04-27 NOTE — Progress Notes (Signed)
Mobility Specialist Progress Note    04/27/23 1143  Mobility  Activity Ambulated with assistance in hallway  Level of Assistance Contact guard assist, steadying assist  Assistive Device Front wheel walker  Distance Ambulated (ft) 120 ft  Activity Response Tolerated well  Mobility Referral Yes  $Mobility charge 1 Mobility  Mobility Specialist Start Time (ACUTE ONLY) 1118  Mobility Specialist Stop Time (ACUTE ONLY) 1142  Mobility Specialist Time Calculation (min) (ACUTE ONLY) 24 min   Pre-Mobility: 99 HR Post-Mobility: 109 HR  Pt received in bed and agreeable. No complaints on walk. Returned to chair with call bell in reach and alarm on.   [place] Mobility Specialist  Please Neurosurgeon or Rehab Office at [phone]

## 2023-04-27 NOTE — Plan of Care (Signed)

## 2023-04-27 NOTE — Progress Notes (Signed)
PROGRESS NOTE        PATIENT DETAILS Name: Ethan Wells Age: 52 y.o. Sex: male Date of Birth: May 31, 1971 Admit Date: 04/07/2023 Admitting Physician Starleen Arms, MD ZOX:[id number], Margit Banda, NP  Brief Summary: Patient is a 52 y.o.  male with history of polysubstance abuse/EtOH use, small amount of sludge within the gallbladder.  Who presented with GI bleeding-underwent EGD with banding-Hospital course complicated by acute hypoxic respiratory failure requiring intubation.  See below for further details.  Significant events: 4/22>> admitted with acute GI bleed  4/23>> EGD >>  grade 2 varices, banded x 2, portal gastropathy 4/23 >>intubated 4/25>> extubated 4/27>> TIPS, re intubated for procedure. Did not meet extubation criteria and was returned to ICU on vent.  4/30>> NGT placed, extubated 5/02 >> transferred to [facility] 5/06>> MBS evaluation, started on regular diet 5/11>>Cortrak tube removed  Significant studies: 4/26>> TTE: EF 55-60%, grade 2 diastolic dysfunction 5/02>> RUQ ultrasound: Cirrhosis-small amount of sludge in gallbladder.  Significant microbiology data: 4/28>> tracheal aspirate: No organisms  Procedures: 4/23>> EGD 4/27>> TIPS  Consults: PCCM GI IR  Subjective: Asking when he can go home.  Awake/alert-eating breakfast.    Objective: Vitals: Blood pressure (!) 98/24, pulse 98, temperature 99.9 F (37.7 C), temperature source Oral, resp. rate 20, height 5\' 5"  (1.651 m), weight 84.5 kg, SpO2 98 %.   Exam: Gen Exam:Alert awake-not in any distress HEENT:atraumatic, normocephalic Chest: B/L clear to auscultation anteriorly CVS:S1S2 regular Abdomen:soft non tender, non distended Extremities:no edema Neurology: Non focal Skin: no rash  Pertinent Labs/Radiology:    Latest Ref Rng & Units 04/26/2023    8:07 AM 04/24/2023    6:42 AM 04/23/2023    4:11 AM  CBC  WBC 4.0 - 10.5 K/uL 8.1  9.4  10.6   Hemoglobin 13.0 -  17.0 g/dL 9.3  9.3  91.4   Hematocrit 39.0 - 52.0 % 28.9  29.4  31.8   Platelets 150 - 400 K/uL 232  207  183     Lab Results  Component Value Date   NA 134 (L) 04/26/2023   K 3.7 04/26/2023   CL 106 04/26/2023   CO2 20 (L) 04/26/2023      Assessment/Plan: Acute hypoxic respiratory failure Resolved-on room air Per prior chart review-thought to have acute pulmonary edema-related to transfusion.  Upper GI bleed-secondary to esophageal varices/portal gastropathy Acute blood loss anemia in the setting of GI bleeding S/p EGD with banding on 4/23 and TIPS procedure on 4/27 GI bleeding seems to have resolved Hb stable Continue PPI Follow CBC periodically.  Acute hepatic encephalopathy Has significantly improved over the past several days-completely awake/alert-walking the hallway this morning with mobility staff Continue rifaximin/lactulose.   Alcoholic liver cirrhosis-with portal gastropathy, hypersplenism induced thrombocytopenia Volume status relatively stable Continue Lasix/Aldactone  Chronic HFpEF Volume status relatively stable Continue diuretics  History of CAD-s/p STEMI with V-fib arrest 2012-s/p LAD PCI Non-STEMI 11/04/2022-LHC with patent LAD stent-medical management recommended by cardiology Not on antiplatelets given severity of GI bleeding Continue statin  Adrenal insufficiency Continue hydrocortisone  Mood disorder Seroquel  EtOH use Polysubstance abuse Drug screen positive for benzo's/cocaine Should be out of window for withdrawal symptoms  Deconditioning related to acute illness Oropharyngeal dysphagia related to acute illness/muscular deconditioning Discussed with patient/nursing staff-significant improvement oral intake-discontinue NG tube PT/OT-SNF recommended.  Per social work-he will be difficult  to place.  Nutrition Status: Nutrition Problem: Inadequate oral intake Etiology: inability to eat Signs/Symptoms: NPO status Interventions: Ensure  Enlive (each supplement provides 350kcal and 20 grams of protein), MVI, Tube feeding, Prostat  Obesity: Estimated body mass index is 31 kg/m as calculated from the following:   Height as of this encounter: 5\' 5"  (1.651 m).   Weight as of this encounter: 84.5 kg.   Code status:   Code Status: Full Code   DVT Prophylaxis: enoxaparin (LOVENOX) injection 40 mg Start: 04/15/23 1115 Place TED hose Start: 04/13/23 1047 SCDs Start: 04/07/23 2045    Family Communication: None at bedside   Disposition Plan: Status is: Inpatient Remains inpatient appropriate because: Severity of illness   Planned Discharge Destination:Skilled nursing facility versus home with home health   Diet: Diet Order             Diet regular Room service appropriate? Yes with Assist; Fluid consistency: Thin  Diet effective now                     Antimicrobial agents: Anti-infectives (From admission, onward)    Start     Dose/Rate Route Frequency Ordered Stop   04/25/23 2200  rifaximin (XIFAXAN) tablet 550 mg        550 mg Oral 2 times daily 04/25/23 1436     04/20/23 1230  rifaximin (XIFAXAN) tablet 550 mg  Status:  Discontinued        550 mg Per Tube 2 times daily 04/20/23 1132 04/25/23 1436   04/11/23 1045  vancomycin (VANCOCIN) IVPB 1000 mg/200 mL premix        1,000 mg 200 mL/hr over 60 Minutes Intravenous  Once 04/11/23 0952 04/11/23 1211   04/09/23 0830  cefTRIAXone (ROCEPHIN) 1 g in sodium chloride 0.9 % 100 mL IVPB  Status:  Discontinued        1 g 200 mL/hr over 30 Minutes Intravenous Every 24 hours 04/08/23 0913 04/11/23 1133   04/08/23 1400  cefTRIAXone (ROCEPHIN) 2 g in sodium chloride 0.9 % 100 mL IVPB  Status:  Discontinued        2 g 200 mL/hr over 30 Minutes Intravenous Every 24 hours 04/07/23 2016 04/08/23 0742   04/08/23 0800  cefTRIAXone (ROCEPHIN) 2 g in sodium chloride 0.9 % 100 mL IVPB  Status:  Discontinued        2 g 200 mL/hr over 30 Minutes Intravenous Every 24 hours  04/08/23 0742 04/08/23 0913        MEDICATIONS: Scheduled Meds:  arformoterol  15 mcg Nebulization BID   Chlorhexidine Gluconate Cloth  6 each Topical Daily   enoxaparin (LOVENOX) injection  40 mg Subcutaneous Daily   feeding supplement  1 Container Oral TID BM   feeding supplement (PROSource TF20)  60 mL Per Tube Daily   folic acid  1 mg Oral Daily   furosemide  20 mg Oral Daily   gabapentin  300 mg Oral Daily   Gerhardt's butt cream   Topical BID   hydrocortisone  10 mg Oral Daily   hydrocortisone  5 mg Oral Daily   insulin aspart  0-9 Units Subcutaneous TID WC   lactulose  20 g Oral TID   midodrine  5 mg Oral Q8H   multivitamin with minerals  1 tablet Oral Daily   pantoprazole  40 mg Oral BID   potassium chloride  20 mEq Oral BID   QUEtiapine  75 mg Oral QHS   revefenacin  175 mcg Nebulization Daily   rifaximin  550 mg Oral BID   rosuvastatin  40 mg Oral Daily   sodium chloride flush  10-40 mL Intracatheter Q12H   spironolactone  12.5 mg Oral Daily   thiamine  100 mg Oral Daily   Continuous Infusions:  sodium chloride Stopped (04/17/23 0000)   PRN Meds:.sodium chloride, ALPRAZolam, docusate sodium, ipratropium-albuterol, mouth rinse, mouth rinse, oxyCODONE, polyethylene glycol, sodium chloride flush   I have personally reviewed following labs and imaging studies  LABORATORY DATA: CBC: Recent Labs  Lab 04/20/23 1423 04/22/23 0442 04/23/23 0411 04/24/23 0642 04/26/23 0807  WBC 19.0* 13.0* 10.6* 9.4 8.1  HGB 11.1* 10.2* 10.1* 9.3* 9.3*  HCT 35.7* 31.0* 31.8* 29.4* 28.9*  MCV 92.0 89.1 90.6 91.3 89.8  PLT 167 174 183 207 232     Basic Metabolic Panel: Recent Labs  Lab 04/20/23 1423 04/22/23 0442 04/23/23 0411 04/24/23 0642 04/26/23 0807  NA 135 131* 131* 133* 134*  K 3.8 4.3 4.4 4.1 3.7  CL 111 107 107 109 106  CO2 17* 17* 18* 18* 20*  GLUCOSE 144* 191* 186* 210* 169*  BUN 10 13 13 10 8   CREATININE 0.65 0.76 0.67 0.67 0.61  CALCIUM 8.4* 8.2*  8.1* 8.3* 8.5*  MG 1.9 2.2  --   --   --   PHOS 2.5 2.0*  --   --   --      GFR: Estimated Creatinine Clearance: 110.5 mL/min (by C-G formula based on SCr of 0.61 mg/dL).  Liver Function Tests: Recent Labs  Lab 04/26/23 0807  AST 44*  ALT 33  ALKPHOS 160*  BILITOT 1.5*  PROT 6.7  ALBUMIN 2.1*    No results for input(s): "LIPASE", "AMYLASE" in the last 168 hours.  Recent Labs  Lab 04/20/23 1321 04/22/23 0442 04/23/23 0411 04/24/23 0642 04/26/23 0807  AMMONIA 62* 74* 66* 42* 42*     Coagulation Profile: No results for input(s): "INR", "PROTIME" in the last 168 hours.  Cardiac Enzymes: No results for input(s): "CKTOTAL", "CKMB", "CKMBINDEX", "TROPONINI" in the last 168 hours.  BNP (last 3 results) No results for input(s): "PROBNP" in the last 8760 hours.  Lipid Profile: No results for input(s): "CHOL", "HDL", "LDLCALC", "TRIG", "CHOLHDL", "LDLDIRECT" in the last 72 hours.  Thyroid Function Tests: No results for input(s): "TSH", "T4TOTAL", "FREET4", "T3FREE", "THYROIDAB" in the last 72 hours.  Anemia Panel: No results for input(s): "VITAMINB12", "FOLATE", "FERRITIN", "TIBC", "IRON", "RETICCTPCT" in the last 72 hours.  Urine analysis:    Component Value Date/Time   COLORURINE YELLOW (A) 02/22/2023 1055   APPEARANCEUR CLEAR (A) 02/22/2023 1055   APPEARANCEUR Clear 03/16/2012 1315   LABSPEC 1.018 02/22/2023 1055   LABSPEC 1.013 03/16/2012 1315   PHURINE 5.0 02/22/2023 1055   GLUCOSEU NEGATIVE 02/22/2023 1055   GLUCOSEU 50 mg/dL 06/14/1600 0932   HGBUR NEGATIVE 02/22/2023 1055   BILIRUBINUR NEGATIVE 02/22/2023 1055   BILIRUBINUR Negative 03/16/2012 1315   KETONESUR 5 (A) 02/22/2023 1055   PROTEINUR NEGATIVE 02/22/2023 1055   NITRITE NEGATIVE 02/22/2023 1055   LEUKOCYTESUR NEGATIVE 02/22/2023 1055   LEUKOCYTESUR Negative 03/16/2012 1315    Sepsis Labs: Lactic Acid, Venous    Component Value Date/Time   LATICACIDVEN 2.3 (HH) 04/07/2023 2208     MICROBIOLOGY: No results found for this or any previous visit (from the past 240 hour(s)).   RADIOLOGY STUDIES/RESULTS: No results found.   LOS: 20 days   Jeoffrey Massed, MD  [facility]  To contact the attending provider between 7A-7P or the covering provider during after hours 7P-7A, please log into the web site [email or web address] and access using universal [place] password for that web site. If you do not have the password, please call the hospital operator.  04/27/2023, 1:10 PM

## 2023-04-28 ENCOUNTER — Other Ambulatory Visit (HOSPITAL_COMMUNITY): Payer: Self-pay

## 2023-04-28 LAB — GLUCOSE, CAPILLARY
Glucose-Capillary: 103 mg/dL — ABNORMAL HIGH (ref 70–99)
Glucose-Capillary: 193 mg/dL — ABNORMAL HIGH (ref 70–99)
Glucose-Capillary: 154 mg/dL — ABNORMAL HIGH (ref 70–99)
Glucose-Capillary: 154 mg/dL — ABNORMAL HIGH (ref 70–99)

## 2023-04-28 MED ORDER — GABAPENTIN 300 MG PO CAPS
300.0000 mg | ORAL_CAPSULE | Freq: Two times a day (BID) | ORAL | 1 refills | Status: DC
Start: 2023-04-28 — End: 2023-05-13
  Filled 2023-04-28: qty 60, 30d supply, fill #0

## 2023-04-28 MED ORDER — ADULT MULTIVITAMIN W/MINERALS CH
1.0000 | ORAL_TABLET | Freq: Every day | ORAL | 1 refills | Status: AC
  Filled 2023-04-28: qty 30, 30d supply, fill #0

## 2023-04-28 MED ORDER — RIFAXIMIN 550 MG PO TABS
550.0000 mg | ORAL_TABLET | Freq: Two times a day (BID) | ORAL | 1 refills | Status: DC
  Filled 2023-04-28: qty 60, 30d supply, fill #0

## 2023-04-28 MED ORDER — FOLIC ACID 1 MG PO TABS
1.0000 mg | ORAL_TABLET | Freq: Every day | ORAL | 1 refills | Status: DC
  Filled 2023-04-28: qty 30, 30d supply, fill #0

## 2023-04-28 MED ORDER — ROSUVASTATIN CALCIUM 40 MG PO TABS
40.0000 mg | ORAL_TABLET | Freq: Every day | ORAL | 1 refills | Status: DC
Start: 2023-04-28 — End: 2023-07-05
  Filled 2023-04-28: qty 30, 30d supply, fill #0

## 2023-04-28 MED ORDER — PROSOURCE PLUS PO LIQD: 30.0000 mL | Freq: Two times a day (BID) | ORAL | Status: DC

## 2023-04-28 MED ORDER — MIDODRINE HCL 5 MG PO TABS
5.0000 mg | ORAL_TABLET | Freq: Three times a day (TID) | ORAL | 2 refills | Status: DC
  Filled 2023-04-28: qty 90, 30d supply, fill #0

## 2023-04-28 MED ORDER — ADULT MULTIVITAMIN W/MINERALS CH
1.0000 | ORAL_TABLET | Freq: Every day | ORAL | 1 refills | Status: DC
  Filled 2023-04-28: qty 30, 30d supply, fill #0

## 2023-04-28 MED ORDER — HYDROCORTISONE 10 MG PO TABS
5.0000 mg | ORAL_TABLET | Freq: Every day | ORAL | 0 refills | Status: DC
  Filled 2023-04-28: qty 15, 30d supply, fill #0

## 2023-04-28 MED ORDER — PANTOPRAZOLE SODIUM 40 MG PO TBEC
40.0000 mg | DELAYED_RELEASE_TABLET | Freq: Two times a day (BID) | ORAL | 1 refills | Status: DC
Start: 2023-04-28 — End: 2023-07-05
  Filled 2023-04-28: qty 60, 30d supply, fill #0

## 2023-04-28 MED ORDER — FUROSEMIDE 20 MG PO TABS
20.0000 mg | ORAL_TABLET | Freq: Every day | ORAL | 1 refills | Status: DC
  Filled 2023-04-28: qty 30, 30d supply, fill #0

## 2023-04-28 MED ORDER — SPIRONOLACTONE 25 MG PO TABS
12.5000 mg | ORAL_TABLET | Freq: Every day | ORAL | 1 refills | Status: DC
  Filled 2023-04-28: qty 30, 60d supply, fill #0

## 2023-04-28 MED ORDER — LACTULOSE 10 GM/15ML PO SOLN
20.0000 g | Freq: Three times a day (TID) | ORAL | 1 refills | Status: AC
  Filled 2023-04-28: qty 2365, 26d supply, fill #0

## 2023-04-28 MED ORDER — QUETIAPINE FUMARATE 50 MG PO TABS
50.0000 mg | ORAL_TABLET | Freq: Every day | ORAL | 1 refills | Status: DC
  Filled 2023-04-28: qty 30, 30d supply, fill #0

## 2023-04-28 MED ORDER — HYDROCORTISONE 10 MG PO TABS
10.0000 mg | ORAL_TABLET | Freq: Every day | ORAL | 1 refills | Status: AC
  Filled 2023-04-28: qty 30, 30d supply, fill #0

## 2023-04-28 MED ORDER — POTASSIUM CHLORIDE CRYS ER 20 MEQ PO TBCR
20.0000 meq | EXTENDED_RELEASE_TABLET | Freq: Every day | ORAL | 0 refills | Status: DC
  Filled 2023-04-28: qty 30, 30d supply, fill #0

## 2023-04-28 MED ORDER — NITROGLYCERIN 0.4 MG SL SUBL
0.4000 mg | SUBLINGUAL_TABLET | SUBLINGUAL | 0 refills | Status: DC | PRN
  Filled 2023-04-28: qty 25, 5d supply, fill #0

## 2023-04-28 MED ORDER — THIAMINE HCL 100 MG PO TABS
100.0000 mg | ORAL_TABLET | Freq: Every day | ORAL | 1 refills | Status: DC
  Filled 2023-04-28: qty 30, 30d supply, fill #0

## 2023-04-28 NOTE — Progress Notes (Signed)
Physical Therapy Treatment Patient Details Name: Ethan Wells MRN: [id number] DOB: 1971/09/12 Today's Date: 04/28/2023   History of Present Illness Ethan Wells is a 52 yo male presented to [facility] ED 4/22 for acute GI bleed. Of note patient was seen within this health sytem 1 month ago for significant GI bleed requiring banding per GI with TIPS procedure recommended but pt declined. Pt transferred to [facility] due to sepsis resulting in VDRF 4/23-4/25.  Patient reports last drink and cocaine use prior to admit was day prior to admission.   TIPS procedure 4/26  with pt VDRF 4/26-4/29.    PMH significant for recurrent GI bleed with known variceal bleed, ischemic cardiomyopathy, EtOH abuse, polysubstance abuse, HTN, HLD, depression, and anxiety    PT Comments    Pt tolerated today's session well, able to progress ambulation today with RW, continuing to require increased time and fatiguing but able to recover with seated rest breaks. Attempted stair trial today, pt with difficulty attempting to utilize 1 rail with cueing, trying to use B rails, able to complete 2 steps with minG for safety with 1 rail but fatiguing and needing to descend steps and perform seated rest break. Pt is progressing well, goals updated and extended today and discharge recommendations updated to HHPT, continue to recommend rollator for mobility. Acute PT will follow as appropriate to progress mobility and address deficits.     Recommendations for follow up therapy are one component of a multi-disciplinary discharge planning process, led by the attending physician.  Recommendations may be updated based on patient status, additional functional criteria and insurance authorization.  Follow Up Recommendations  Can patient physically be transported by private vehicle: Yes    Assistance Recommended at Discharge Intermittent Supervision/Assistance  Patient can return home with the following A little help with walking and/or  transfers;Help with stairs or ramp for entrance;Assist for transportation   Equipment Recommendations  Rollator (4 wheels)    Recommendations for Other Services       Precautions / Restrictions Precautions Precautions: Fall Restrictions Weight Bearing Restrictions: No     Mobility  Bed Mobility               General bed mobility comments: pt seated in chair upon arrival, ended session in chair    Transfers Overall transfer level: Needs assistance Equipment used: Rolling walker (2 wheels) Transfers: Sit to/from Stand Sit to Stand: Supervision           General transfer comment: supervision from recliner and x2 trials from straight leg chair    Ambulation/Gait Ambulation/Gait assistance: Supervision Gait Distance (Feet): 250 Feet (with 2 seated rest breaks) Assistive device: Rolling walker (2 wheels) Gait Pattern/deviations: Step-to pattern, Decreased stride length, Decreased dorsiflexion - right, Decreased dorsiflexion - left, Shuffle, Trunk flexed Gait velocity: decreased     General Gait Details: able to progress ambulation distance but increased time required and pt often distracted by environment, seated rest breaks needed due to fatigue but HR 110s   Stairs Stairs: Yes Stairs assistance: Min guard Stair Management: One rail Right, Two rails, Step to pattern, Forwards, Sideways, Backwards Number of Stairs: 3 General stair comments: pt reports one rail on steps, increased cueing to get pt to utilize one rail, climbing first step with B rails, progressing to 1 rail and ascending 2 steps sideways, pt then descending steps backwards with B rails due to fatigue and declining to turn around   Wheelchair Mobility    Modified Rankin (Stroke Patients Only)  Balance Overall balance assessment: Needs assistance Sitting-balance support: No upper extremity supported, Feet supported Sitting balance-Leahy Scale: Good     Standing balance support:  Bilateral upper extremity supported, During functional activity Standing balance-Leahy Scale: Poor Standing balance comment: reliant on RW                            Cognition Arousal/Alertness: Awake/alert Behavior During Therapy: Flat affect Overall Cognitive Status: No family/caregiver present to determine baseline cognitive functioning                                 General Comments: slow processing and decreased awareness of deficits        Exercises      General Comments General comments (skin integrity, edema, etc.): HR up to 110s with ambulation and stair trial, recovers to 90s with seated rest break      Pertinent Vitals/Pain Pain Assessment Pain Assessment: No/denies pain    Home Living                          Prior Function            PT Goals (current goals can now be found in the care plan section) Acute Rehab PT Goals Patient Stated Goal: to go back to group home PT Goal Formulation: With patient Time For Goal Achievement: 05/12/23 Potential to Achieve Goals: Good Progress towards PT goals: Progressing toward goals (goals updated today)    Frequency    Min 3X/week      PT Plan Discharge plan needs to be updated;Frequency needs to be updated    Co-evaluation              AM-PAC PT "6 Clicks" Mobility   Outcome Measure  Help needed turning from your back to your side while in a flat bed without using bedrails?: A Little Help needed moving from lying on your back to sitting on the side of a flat bed without using bedrails?: A Little Help needed moving to and from a bed to a chair (including a wheelchair)?: A Little Help needed standing up from a chair using your arms (e.g., wheelchair or bedside chair)?: A Little Help needed to walk in hospital room?: A Little Help needed climbing 3-5 steps with a railing? : A Little 6 Click Score: 18    End of Session Equipment Utilized During Treatment: Gait  belt Activity Tolerance: Patient tolerated treatment well Patient left: in chair;with call bell/phone within reach;with chair alarm set Nurse Communication: Mobility status PT Visit Diagnosis: Unsteadiness on feet (R26.81);Muscle weakness (generalized) (M62.81)     Time: 1610-9604 PT Time Calculation (min) (ACUTE ONLY): 36 min  Charges:  $Gait Training: 23-37 mins                     Lindalou Hose, PT DPT Acute Rehabilitation Services Office [phone]    Leonie Man 04/28/2023, 4:42 PM

## 2023-04-28 NOTE — Discharge Summary (Signed)
PATIENT DETAILS Name: Ethan Wells Age: 52 y.o. Sex: male Date of Birth: [birth year] MRN: [id number]. Admitting Physician: Starleen Arms, MD WUJ:[id number], Margit Banda, NP  Admit Date: 04/07/2023 Discharge date: 04/28/2023  Recommendations for Outpatient Follow-up:  Follow up with PCP in 1-2 weeks Please obtain CMP/CBC in one week Please follow up on the following pending results:  Admitted From:  Home  Disposition: Home   Discharge Condition: fair  CODE STATUS:   Code Status: Full Code   Diet recommendation:  Diet Order             Diet - low sodium heart healthy           Diet regular Room service appropriate? Yes with Assist; Fluid consistency: Thin  Diet effective now                    Brief Summary: Patient is a 52 y.o.  male with history of polysubstance abuse/EtOH use, small amount of sludge within the gallbladder.  Who presented with GI bleeding-underwent EGD with banding-Hospital course complicated by acute hypoxic respiratory failure requiring intubation.  See below for further details.   Significant events: 4/22>> admitted with acute GI bleed  4/23>> EGD >>  grade 2 varices, banded x 2, portal gastropathy 4/23 >>intubated 4/25>> extubated 4/27>> TIPS, re intubated for procedure. Did not meet extubation criteria and was returned to ICU on vent.  4/30>> NGT placed, extubated 5/02 >> transferred to [facility] 5/06>> MBS evaluation, started on regular diet 5/11>>Cortrak tube removed   Significant studies: 4/26>> TTE: EF 55-60%, grade 2 diastolic dysfunction 5/02>> RUQ ultrasound: Cirrhosis-small amount of sludge in gallbladder.   Significant microbiology data: 4/28>> tracheal aspirate: No organisms   Procedures: 4/23>> EGD 4/27>> TIPS   Consults: PCCM GI IR    Brief Hospital Course: Acute hypoxic respiratory failure Resolved-on room air Per prior chart review-thought to have acute pulmonary edema-related to transfusion.    Upper GI bleed-secondary to esophageal varices/portal gastropathy Acute blood loss anemia in the setting of GI bleeding S/p EGD with banding on 4/23 and TIPS procedure on 4/27 GI bleeding seems to have resolved Hb stable Continue PPI Follow CBC periodically as outpatient   Acute hepatic encephalopathy Resolved Awake/alert  Continue rifaximin/lactulose.    Alcoholic liver cirrhosis-with portal gastropathy, hypersplenism induced thrombocytopenia Volume status relatively stable Continue Lasix/Aldactone   Chronic HFpEF Volume status stable Continue diuretics   History of CAD-s/p STEMI with V-fib arrest 2012-s/p LAD PCI Non-STEMI 11/04/2022-LHC with patent LAD stent-medical management recommended by cardiology Not on antiplatelets given severity of GI bleeding Continue statin   Adrenal insufficiency Continue hydrocortisone   Mood disorder Seroquel   EtOH use Polysubstance abuse Drug screen positive for benzo's/cocaine Should be out of window for withdrawal symptoms   Deconditioning related to acute illness Oropharyngeal dysphagia related to acute illness/muscular deconditioning Discussed with patient/nursing staff-significant improvement oral intake-discontinue NG tube PT/OT-SNF recommended-however he is rapidly improved-and plan is to discharge him home with outpatient physical therapy.  He is adamant on going home-he does not want to go to SNF.   Nutrition Status: Nutrition Problem: Inadequate oral intake Etiology: inability to eat Signs/Symptoms: NPO status Interventions: Ensure Enlive (each supplement provides 350kcal and 20 grams of protein), MVI, Tube feeding, Prostat   Obesity: Estimated body mass index is 31.18 kg/m as calculated from the following:   Height as of this encounter: 5\' 5"  (1.651 m).   Weight as of this encounter: 85 kg.    Discharge  Diagnoses:  Principal Problem:   GI bleed Active Problems:   Anemia due to GI blood loss   Acute hypoxic  respiratory failure (HCC)   Secondary esophageal varices with bleeding (HCC)   Esophageal varices with bleeding in diseases classified elsewhere [facility])   Hepatic encephalopathy (HCC)   Alcoholic cirrhosis (HCC)   Discharge Instructions:  Activity:  As tolerated with Full fall precautions use walker/cane & assistance as needed   Discharge Instructions     Call MD for:  difficulty breathing, headache or visual disturbances   Complete by: As directed    Call MD for:  extreme fatigue   Complete by: As directed    Call MD for:  persistant dizziness or light-headedness   Complete by: As directed    Diet - low sodium heart healthy   Complete by: As directed    Discharge instructions   Complete by: As directed    Follow with Primary MD  Medina-Vargas, Monina C, NP in 1-2 weeks  Please get a complete blood count and chemistry panel checked by your Primary MD at your next visit, and again as instructed by your Primary MD.  Get Medicines reviewed and adjusted: Please take all your medications with you for your next visit with your Primary MD  Laboratory/radiological data: Please request your Primary MD to go over all hospital tests and procedure/radiological results at the follow up, please ask your Primary MD to get all Hospital records sent to his/her office.  In some cases, they will be blood work, cultures and biopsy results pending at the time of your discharge. Please request that your primary care M.D. follows up on these results.  Also Note the following: If you experience worsening of your admission symptoms, develop shortness of breath, life threatening emergency, suicidal or homicidal thoughts you must seek medical attention immediately by calling 911 or calling your MD immediately  if symptoms less severe.  You must read complete instructions/literature along with all the possible adverse reactions/side effects for all the Medicines you take and that have been prescribed to  you. Take any new Medicines after you have completely understood and accpet all the possible adverse reactions/side effects.   Do not drive when taking Pain medications or sleeping medications (Benzodaizepines)  Do not take more than prescribed Pain, Sleep and Anxiety Medications. It is not advisable to combine anxiety,sleep and pain medications without talking with your primary care practitioner  Special Instructions: If you have smoked or chewed Tobacco  in the last 2 yrs please stop smoking, stop any regular Alcohol  and or any Recreational drug use.  Wear Seat belts while driving.  Please note: You were cared for by a hospitalist during your hospital stay. Once you are discharged, your primary care physician will handle any further medical issues. Please note that NO REFILLS for any discharge medications will be authorized once you are discharged, as it is imperative that you return to your primary care physician (or establish a relationship with a primary care physician if you do not have one) for your post hospital discharge needs so that they can reassess your need for medications and monitor your lab values.   Increase activity slowly   Complete by: As directed       Allergies as of 04/28/2023       Reactions   Penicillins Hives   Tolerated Ceftriaxone 10/2022        Medication List     STOP taking these medications  acetaminophen 500 MG tablet Commonly known as: TYLENOL   ibuprofen 800 MG tablet Commonly known as: ADVIL   methocarbamol 750 MG tablet Commonly known as: ROBAXIN   Torsemide 40 MG Tabs       TAKE these medications    folic acid 1 MG tablet Commonly known as: FOLVITE Take 1 tablet (1 mg total) by mouth daily. Start taking on: Apr 29, 2023   furosemide 20 MG tablet Commonly known as: LASIX Take 1 tablet (20 mg total) by mouth daily. Start taking on: Apr 29, 2023   gabapentin 300 MG capsule Commonly known as: NEURONTIN Take 1 capsule (300  mg total) by mouth 2 (two) times daily. What changed: when to take this   hydrocortisone 10 MG tablet Commonly known as: CORTEF Take 1 tablet (10 mg total) by mouth daily.   hydrocortisone 5 MG tablet Commonly known as: CORTEF Take 1 tablet (5 mg total) by mouth daily after lunch.   lactulose 10 GM/15ML solution Commonly known as: CHRONULAC Take 30 mLs (20 g total) by mouth 3 (three) times daily. Titrate to 1-2 BM per day What changed: when to take this   midodrine 5 MG tablet Commonly known as: PROAMATINE Take 1 tablet (5 mg total) by mouth 3 (three) times daily. Skip the dose if Systolic BP greater than 110 mmHg and or HR greater than 100 What changed:  medication strength how much to take when to take this   multivitamin with minerals Tabs tablet Take 1 tablet by mouth daily. What changed: Another medication with the same name was added. Make sure you understand how and when to take each.   multivitamin with minerals Tabs tablet Take 1 tablet by mouth daily. Start taking on: Apr 29, 2023 What changed: You were already taking a medication with the same name, and this prescription was added. Make sure you understand how and when to take each.   nitroGLYCERIN 0.4 MG SL tablet Commonly known as: NITROSTAT Place 1 tablet (0.4 mg total) under the tongue every 5 (five) minutes as needed for chest pain.   pantoprazole 40 MG tablet Commonly known as: PROTONIX Take 1 tablet (40 mg total) by mouth 2 (two) times daily.   potassium chloride 20 MEQ packet Commonly known as: KLOR-CON Take 20 mEq by mouth daily.   QUEtiapine 50 MG tablet Commonly known as: SEROQUEL Take 1 tablet (50 mg total) by mouth at bedtime. What changed:  medication strength how much to take   rifaximin 550 MG Tabs tablet Commonly known as: XIFAXAN Take 1 tablet (550 mg total) by mouth 2 (two) times daily.   rosuvastatin 40 MG tablet Commonly known as: CRESTOR Take 1 tablet (40 mg total) by mouth  daily.   spironolactone 25 MG tablet Commonly known as: ALDACTONE Take 0.5 tablets (12.5 mg total) by mouth daily.   thiamine 100 MG tablet Commonly known as: Vitamin B-1 Take 1 tablet (100 mg total) by mouth daily. Start taking on: Apr 29, 2023               Durable Medical Equipment  (From admission, onward)           Start     Ordered   04/17/23 1155  For home use only DME 4 wheeled rolling walker with seat  Once       Question:  Patient needs a walker to treat with the following condition  Answer:  Ischemic cardiomyopathy   04/17/23 1156  Follow-up Information     [facility] Follow up.   Specialty: Rehabilitation Why: Outpatient [facility] referrals have been made.  Please contact above and schedule your initial visit Contact information: [id number] [address] [place] [address] [phone]        Medina-Vargas, Margit Banda, NP. Schedule an appointment as soon as possible for a visit in 1 week(s).   Specialty: Internal Medicine Contact information: [address] [place] [address] [phone]                Allergies  Allergen Reactions   Penicillins Hives    Tolerated Ceftriaxone 10/2022     Other Procedures/Studies: DG Swallowing Func-Speech Pathology  Result Date: 04/21/2023 Table formatting from the original result was not included. Modified Barium Swallow Study Patient Details Name: Ethan Wells MRN: [id number] Date of Birth: 10-12-1971 Today's Date: 04/21/2023 HPI/PMH: HPI: Patient is a 52 y.o. male with PMH: recurrent GI bleed with known variceal bleed, ischemic cardiomyopathy, ETOH abuse, polysubstance abuse, HTN, HLD, depression and anxiety. He presented to [facility] ED on 04/07/23 with acute GI bleed. He was admitted previous month for GI bleed and TIPS procedure recommended at that time but patient declined it. In ED, patient was hypotensive,  mildly tachycardic. EGD completed 4/23. He was intubated 4/23 for flash pulmonary edema and extubated 4/25. He was reintubated on 4/27 for TIPS procedure but unable to be extubated following procedure. On 4/30 NG was placed and patient was extubated.  Pt has undergone BSE with recommendation for NPO - Clinical Impression: Clinical Impression: Pt presented with functional oropharyngeal swallow with adequate oral control of bolus, reliable laryngeal vestibule closure with no penetration/aspiration, normal pharyngeal stripping with no residue post-swallow, and normal ability to manage swallowing a pill with thin liquid with no aspiration and  transfer of pill through esophagus. Recommend starting a regular diet, thin liquids; meds in liquid. Mental status will be the primary barrier to adequate PO intake. SLP will f/u x1 to ensure functionality during mealtime. D/W RD, RN, MD. Factors that may increase risk of adverse event in presence of aspiration Rubye Oaks & Clearance Coots 2021): Factors that may increase risk of adverse event in presence of aspiration Rubye Oaks & Clearance Coots 2021): Reduced cognitive function Recommendations/Plan: Swallowing Evaluation Recommendations Swallowing Evaluation Recommendations Recommendations: PO diet PO Diet Recommendation: Regular; Thin liquids (Level 0) Liquid Administration via: Cup; Straw Medication Administration: Whole meds with liquid Supervision: Patient able to self-feed; Set-up assistance for safety Oral care recommendations: Oral care BID (2x/day) Treatment Plan Treatment Plan Treatment recommendations: Therapy as outlined in treatment plan below Follow-up recommendations: No SLP follow up Treatment frequency: Min 1x/week Treatment duration: 1 week Interventions: Diet toleration management by SLP Recommendations Recommendations for follow up therapy are one component of a multi-disciplinary discharge planning process, led by the attending physician.  Recommendations may be updated based on  patient status, additional functional criteria and insurance authorization. Assessment: Orofacial Exam: Orofacial Exam Oral Cavity - Dentition: Poor condition; Missing dentition Orofacial Anatomy: WFL Oral Motor/Sensory Function: WFL Anatomy: Anatomy: WFL Boluses Administered: Boluses Administered Boluses Administered: Thin liquids (Level 0); Mildly thick liquids (Level 2, nectar thick); Puree; Solid  Oral Impairment Domain: Oral Impairment Domain Lip Closure: No labial escape Tongue control during bolus hold: Escape to lateral buccal cavity/floor of mouth Bolus preparation/mastication: Timely and efficient chewing and mashing Bolus transport/lingual motion: Brisk tongue motion Oral residue: Trace residue lining oral structures Location of oral residue : Tongue Initiation of  pharyngeal swallow : Pyriform sinuses  Pharyngeal Impairment Domain: Pharyngeal Impairment Domain Soft palate elevation: No bolus between soft palate (SP)/pharyngeal wall (PW) Laryngeal elevation: Complete superior movement of thyroid cartilage with complete approximation of arytenoids to epiglottic petiole Anterior hyoid excursion: Complete anterior movement Epiglottic movement: Complete inversion Laryngeal vestibule closure: Complete, no air/contrast in laryngeal vestibule Pharyngeal stripping wave : Present - complete Pharyngeal contraction (A/P view only): N/A Pharyngoesophageal segment opening: Complete distension and complete duration, no obstruction of flow Tongue base retraction: No contrast between tongue base and posterior pharyngeal wall (PPW) Pharyngeal residue: Complete pharyngeal clearance Location of pharyngeal residue: N/A  Esophageal Impairment Domain: Esophageal Impairment Domain Esophageal clearance upright position: Complete clearance, esophageal coating Pill: Esophageal Impairment Domain Esophageal clearance upright position: Complete clearance, esophageal coating Penetration/Aspiration Scale Score: Penetration/Aspiration  Scale Score 1.  Material does not enter airway: Thin liquids (Level 0); Mildly thick liquids (Level 2, nectar thick); Puree; Solid; Pill Compensatory Strategies: Compensatory Strategies Compensatory strategies: Yes Straw: Effective   General Information: Caregiver present: No  Diet Prior to this Study: NPO   Temperature : Febrile   Respiratory Status: WFL   Supplemental O2: None (Room air)   History of Recent Intubation: Yes  Behavior/Cognition: Alert; Cooperative; Requires cueing Self-Feeding Abilities: Able to self-feed Baseline vocal quality/speech: Hypophonia/low volume Volitional Cough: Able to elicit Volitional Swallow: Able to elicit Exam Limitations: Limited visibility; Excessive movement Goal Planning: Prognosis for improved oropharyngeal function: Good No data recorded No data recorded Patient/Family Stated Goal: HOB reclined No data recorded Pain: Pain Assessment Pain Assessment: No/denies pain Pain Score: 8 Faces Pain Scale: 4 Breathing: 0 Negative Vocalization: 0 Facial Expression: 0 Body Language: 0 Consolability: 0 PAINAD Score: 0 Facial Expression: 0 Body Movements: 0 Muscle Tension: 0 Compliance with ventilator (intubated pts.): N/A Vocalization (extubated pts.): 0 CPOT Total: 0 Pain Location: abdomen Pain Descriptors / Indicators: Aching; Grimacing; Guarding Pain Intervention(s): Limited activity within patient's tolerance; Monitored during session; Repositioned End of Session: Start Time:SLP Start Time (ACUTE ONLY): 1610 Stop Time: SLP Stop Time (ACUTE ONLY): 9604 Time Calculation:SLP Time Calculation (min) (ACUTE ONLY): 18 min Charges: SLP Evaluations $ SLP Speech Visit: 1 Visit SLP Evaluations $BSS Swallow: 1 Procedure $MBS Swallow: 1 Procedure $Swallowing Treatment: 1 Procedure SLP visit diagnosis: SLP Visit Diagnosis: Dysphagia, unspecified (R13.10) Past Medical History: Past Medical History: Diagnosis Date  CAD (coronary artery disease)   a. 01/2011 Anterior STEMI/Cath/PCI: LM nl, LAD 100d  (3.5x61mm Vision BMS placed), LCX 18m, RI 50, RCA min irregs, EF 40% w/ apical, inferoapical HK; b. 10/2022 NSTSEMI/Cath: LM nl, LAD 5p/m, RI 85, LCX 13m, OM1 mild dzs, OM3 100 fills via L->L collats from dLAD, RCA 80p, 143m, RPDA fills via collats from LAD. EF 45-50%-->Med Rx.  Cardiac arrest - ventricular fibrillation   a. In setting of STEMI 01/2011  Chronic pain   Cocaine abuse (HCC)   Depression with anxiety   ETOH abuse   a. 6-12 beers/day  GERD (gastroesophageal reflux disease)   GI bleed   Hemorrhoids   Hyperlipidemia   Hypertension   Ischemic cardiomyopathy   a. 06/2011 Echo: EF 45-50%, No rwma; b. 10/2022 Echo: EF 55-60%, no rwma, nl RV fxn.  Marijuana abuse   Migraines   Tobacco abuse   a. 1/2 ppd x 26 yrs Past Surgical History: Past Surgical History: Procedure Laterality Date  COLONOSCOPY  08/09/2021  Procedure: COLONOSCOPY;  Surgeon: Midge Minium, MD;  Location: [facility] ENDOSCOPY;  Service: Endoscopy;;  CORONARY ANGIOPLASTY WITH STENT PLACEMENT  ESOPHAGEAL BANDING  04/08/2023  Procedure: ESOPHAGEAL BANDING;  Surgeon: Tressia Danas, MD;  Location: [facility] ENDOSCOPY;  Service: Gastroenterology;;  ESOPHAGOGASTRODUODENOSCOPY N/A 05/30/2022  Procedure: ESOPHAGOGASTRODUODENOSCOPY (EGD);  Surgeon: Toney Reil, MD;  Location: [facility] ENDOSCOPY;  Service: Gastroenterology;  Laterality: N/A;  ESOPHAGOGASTRODUODENOSCOPY (EGD) WITH PROPOFOL N/A 03/01/2021  Procedure: ESOPHAGOGASTRODUODENOSCOPY (EGD) WITH PROPOFOL;  Surgeon: Toney Reil, MD;  Location: [facility];  Service: Endoscopy;  Laterality: N/A;  ESOPHAGOGASTRODUODENOSCOPY (EGD) WITH PROPOFOL N/A 08/09/2021  Procedure: ESOPHAGOGASTRODUODENOSCOPY (EGD) WITH PROPOFOL;  Surgeon: Midge Minium, MD;  Location: [facility] ENDOSCOPY;  Service: Endoscopy;  Laterality: N/A;  ESOPHAGOGASTRODUODENOSCOPY (EGD) WITH PROPOFOL N/A 03/01/2023  Procedure: ESOPHAGOGASTRODUODENOSCOPY (EGD) WITH PROPOFOL;  Surgeon: Jaynie Collins, DO;  Location: [facility] ENDOSCOPY;   Service: Gastroenterology;  Laterality: N/A;  ESOPHAGOGASTRODUODENOSCOPY (EGD) WITH PROPOFOL N/A 04/08/2023  Procedure: ESOPHAGOGASTRODUODENOSCOPY (EGD) WITH PROPOFOL;  Surgeon: Tressia Danas, MD;  Location: [facility] ENDOSCOPY;  Service: Gastroenterology;  Laterality: N/A;  EVALUATION UNDER ANESTHESIA WITH HEMORRHOIDECTOMY N/A 10/31/2022  Procedure: EXAM UNDER ANESTHESIA WITH SUTURE LIGATION OF TWO BLEEDING PEDICLES;  Surgeon: Henrene Dodge, MD;  Location: [facility] ORS;  Service: General;  Laterality: N/A;  FLEXIBLE SIGMOIDOSCOPY N/A 10/02/2022  Procedure: FLEXIBLE SIGMOIDOSCOPY;  Surgeon: Meryl Dare, MD;  Location: [facility] ENDOSCOPY;  Service: Gastroenterology;  Laterality: N/A;  IR TIPS  04/11/2023  IR US GUIDE VASC ACCESS RIGHT  04/11/2023  IR US GUIDE VASC ACCESS RIGHT  04/11/2023  IR US GUIDE VASC ACCESS RIGHT  04/11/2023  LEFT HEART CATH AND CORONARY ANGIOGRAPHY N/A 11/04/2022  Procedure: LEFT HEART CATH AND CORONARY ANGIOGRAPHY;  Surgeon: Iran Ouch, MD;  Location: [facility] INVASIVE CV LAB;  Service: Cardiovascular;  Laterality: N/A;  RADIOLOGY WITH ANESTHESIA N/A 04/11/2023  Procedure: IR WITH ANESTHESIA;  Surgeon: Oley Balm, MD;  Location: [facility] OR;  Service: Radiology;  Laterality: N/A;  VISCERAL ANGIOGRAPHY N/A 10/24/2022  Procedure: VISCERAL ANGIOGRAPHY;  Surgeon: Annice Needy, MD;  Location: [facility] INVASIVE CV LAB;  Service: Cardiovascular;  Laterality: N/A; Amanda L. Samson Frederic, MA CCC/SLP Clinical Specialist - Acute Care SLP Acute Rehabilitation Services Office number [phone] Blenda Mounts Laurice 04/21/2023, 9:57 AM  US Abdomen Complete  Result Date: 04/17/2023 CLINICAL DATA:  Abdominal pain. EXAM: ABDOMEN ULTRASOUND COMPLETE COMPARISON:  February 28, 2023. FINDINGS: Gallbladder: No gallstones or wall thickening visualized. No sonographic Murphy sign noted by sonographer. Mild amount of sludge is noted. Common bile duct:Not visualized. Liver: Slightly nodular hepatic margins are noted suggesting cirrhosis.  No definite focal sonographic abnormality is noted. Portal vein is patent on color Doppler imaging with normal direction of blood flow towards the liver. IVC: No abnormality visualized. Pancreas: Not visualized due to overlying bowel gas. Spleen: Size and appearance within normal limits. Right Kidney: Length: 11.5 cm. Echogenicity within normal limits. No mass or hydronephrosis visualized. Left Kidney: Length: 11.3 cm. Echogenicity within normal limits. No mass or hydronephrosis visualized. Abdominal aorta: Not visualized. Other findings: None. IMPRESSION: Slightly nodular hepatic margins are noted suggesting hepatic cirrhosis. Common bile duct, pancreas and abdominal aorta were not visualized on this exam. Small amount of sludge noted within gallbladder. Electronically Signed   By: Lupita Raider M.D.   On: 04/17/2023 13:01   DG Chest Port 1 View  Result Date: 04/17/2023 CLINICAL DATA:  Abdominal pain and hypoxia.  [id number].  [id number]. EXAM: PORTABLE CHEST 1 VIEW PORTABLE SUPINE ABDOMEN, 2 FILMS COMPARISON:  Abdomen film earlier today at 11:04 a.m., portable chest 04/13/2023 FINDINGS: Portable chest at 11:31 p.m.: Prior NGT has been exchanged for a  Dobbhoff feeding tube, which terminates in the gastric antrum in good position. Interstitial and faint patchy hazy opacities are noted in the lower lung fields but significantly improved. The upper lung fields have cleared. There is no substantial pleural effusion. Findings most likely indicating clearing edema and/or pneumonia. The heart is enlarged. There is mild central vascular fullness with improvement. Mediastinum is normally outlined. Interval removal prior ETT, left IJ central line. Portable supine abdomen, 11:35 p.m., 2 films: Again noted is a TIPS stent in the liver. There is aeration of the intestinal tract without pathologic dilatation with colonic gas through to the rectum. No pathologic calcifications or acute radiographic findings are seen and no supine  evidence of free air. There are degenerative changes of the spine. No significant osseous findings. IMPRESSION: 1. Dobbhoff feeding tube terminates in the distal gastric antrum in good position. 2. Interstitial and faint patchy hazy opacities in the lower lung fields but significantly improved. Findings most likely indicating clearing edema and/or pneumonia. 3. Cardiomegaly. 4. Aeration throughout the intestinal tract, without pathologic dilatation. 5. TIPS stent in the liver. Electronically Signed   By: Almira Bar M.D.   On: 04/17/2023 00:07   DG Abd 1 View  Result Date: 04/17/2023 CLINICAL DATA:  Abdominal pain and hypoxia.  [id number].  [id number]. EXAM: PORTABLE CHEST 1 VIEW PORTABLE SUPINE ABDOMEN, 2 FILMS COMPARISON:  Abdomen film earlier today at 11:04 a.m., portable chest 04/13/2023 FINDINGS: Portable chest at 11:31 p.m.: Prior NGT has been exchanged for a Dobbhoff feeding tube, which terminates in the gastric antrum in good position. Interstitial and faint patchy hazy opacities are noted in the lower lung fields but significantly improved. The upper lung fields have cleared. There is no substantial pleural effusion. Findings most likely indicating clearing edema and/or pneumonia. The heart is enlarged. There is mild central vascular fullness with improvement. Mediastinum is normally outlined. Interval removal prior ETT, left IJ central line. Portable supine abdomen, 11:35 p.m., 2 films: Again noted is a TIPS stent in the liver. There is aeration of the intestinal tract without pathologic dilatation with colonic gas through to the rectum. No pathologic calcifications or acute radiographic findings are seen and no supine evidence of free air. There are degenerative changes of the spine. No significant osseous findings. IMPRESSION: 1. Dobbhoff feeding tube terminates in the distal gastric antrum in good position. 2. Interstitial and faint patchy hazy opacities in the lower lung fields but significantly  improved. Findings most likely indicating clearing edema and/or pneumonia. 3. Cardiomegaly. 4. Aeration throughout the intestinal tract, without pathologic dilatation. 5. TIPS stent in the liver. Electronically Signed   By: Almira Bar M.D.   On: 04/17/2023 00:07   DG Abd Portable 1V  Result Date: 04/16/2023 CLINICAL DATA:  Feeding tube placement EXAM: PORTABLE ABDOMEN - 1 VIEW limited for tube placement COMPARISON:  X-ray 04/15/2023 FINDINGS: Prior NG tube has been removed. New Dobbhoff tube with tip crossing to the right side of the abdomen, likely at the pylorus or proximal duodenum. Minimal slack in the stomach. Elsewhere note is made of a presumed tips shunt overlying the liver. Gaseous distention of bowel elsewhere in the visualized abdomen IMPRESSION: Limited x-ray for tube placement has a Dobbhoff tube with tip to the right of the abdomen. Likely pylorus or proximal duodenum Electronically Signed   By: Karen Kays M.D.   On: 04/16/2023 11:30   DG Abd Portable 1V  Result Date: 04/15/2023 CLINICAL DATA:  Nasogastric tube placement EXAM: PORTABLE ABDOMEN - 1 VIEW  COMPARISON:  Portable exam 1415 hours compared to 04/12/2023 FINDINGS: 2 nasogastric tubes are identified, projecting over mid stomach and distal stomach. Air-filled nondistended colon. TIPS shunt RIGHT upper quadrant. Minimal atelectasis or infiltrate LEFT base. IMPRESSION: Two nasogastric tubes project over stomach as above. Electronically Signed   By: Ulyses Southward M.D.   On: 04/15/2023 14:30   DG CHEST PORT 1 VIEW  Result Date: 04/13/2023 CLINICAL DATA:  Fever. EXAM: PORTABLE CHEST 1 VIEW COMPARISON:  April 12, 2023 FINDINGS: Endotracheal tube in satisfactory position. Enteric catheter collimated off the image. Left-sided central venous catheter terminates at the cavoatrial juncture. Enlarged heart. Diffuse bilateral hazy and nodular opacities throughout both lungs. Osseous structures are without acute abnormality. Soft tissues are  grossly normal. IMPRESSION: 1. Diffuse bilateral hazy and nodular opacities throughout both lungs. No significant change from the prior radiograph. 2. Enlarged heart. 3. Support apparatus as described above. Electronically Signed   By: Ted Mcalpine M.D.   On: 04/13/2023 15:32   IR Tips  Result Date: 04/12/2023 CLINICAL DATA:  Hematemesis.  Upper GI bleed. Briefly, 52 year old male with history significant for EtOH cirrhosis and esophageal varices s/p EGD with banding 03/01/2023 and plan for interval TIPS. Patient represented to ER with upper GI bleed requiring urgent EGD and repeat banding 04/08/2023. MELD = 16 (04/11/2023). EXAM: Procedures: 1. TRANSJUGULAR INTRAHEPATIC PORTOSYSTEMIC SHUNT (TIPS) 2. INTRACARDIAC ECHOCARDIOGRAPHY (ICE) 3. DIRECT (PERCUTANEOUS) PORTAL VENOUS ACCESS Performing Physician: Roanna Banning, MD Assistant(s): Malachy Moan, MD A qualified trainee/resident or advanced practice provider (APP) was not immediately available to assist with this case. MEDICATIONS: As antibiotic prophylaxis, Vancomycin 1 gm IV was ordered pre-procedure and administered intravenously within one hour of incision. ANESTHESIA/SEDATION: Sedation by the Anesthesia Team was performed. Please see anesthesiology log for details. CONTRAST:  145 mL Omnipaque 300, intravenous FLUOROSCOPY TIME:  Fluoroscopic dose; 1273 mGy COMPLICATIONS: None immediate. PROCEDURE: Informed written consent was obtained from the patient and/or patient's representative after a thorough discussion of the procedural risks, benefits and alternatives. All questions were addressed. Maximal Sterile Barrier Technique was utilized including caps, mask, sterile gowns, sterile gloves, sterile drape, hand hygiene and skin antiseptic. A timeout was performed prior to the initiation of the procedure. Initial ultrasound scanning demonstrates a small amount of ascites within the RIGHT abdomen. An image was saved for documentation purposes.  Paracentesis was NOT performed. A preliminary ultrasound of the RIGHT groin was performed and demonstrates a patent RIGHT common femoral vein. A permanent ultrasound image was recorded. Using a combination of fluoroscopy and ultrasound, an access site was determined. A small dermatotomy was made at the planned puncture site. Using ultrasound guidance, access into the RIGHT greater saphenous vein was obtained with visualization of needle entry into the vessel using a standard micropuncture technique. A wire was advanced into the IVC insert all fascial dilation performed. An 8 Fr, 11 cm vascular sheath was placed into the external iliac vein. Through this access site, an 8 Fr AcuNav ICE catheter was advanced with ease under fluoroscopic guidance to the level of the intrahepatic inferior vena cava. A preliminary ultrasound of the RIGHT neck was performed and demonstrates a patent internal jugular vein. A permanent ultrasound image was recorded. Using a combination of fluoroscopy and ultrasound, an access site was determined. A small dermatotomy was made at the planned puncture site. Using ultrasound guidance, access into the RIGHT internal jugular vein was obtained with visualization of needle entry into the vessel using a standard micropuncture technique. A wire was advanced into the  IVC and serial fascial dilation performed. A 10 Fr 35 cm Cook TIPS sheath was placed into the internal jugular vein and advanced to the IVC. The jugular sheath was retracted into the RIGHT atrium and manometry was performed. HEPATIC VENOGRAM: A 5 Fr angled tip catheter was then directed into the RIGHT hepatic vein. Hepatic venogram was performed. These images demonstrated patent variant anatomy venous an accessory, inferior RIGHT hepatic vein at the level of the caudate. No evidence of the hepatic venous stenosis. The catheter was advanced to a wedge portion of the a patent vein over which the sheath was advanced into the distal hepatic  vein. Using ICE ultrasound visualization the catheter as RIGHT hepatic vein as well as the portal anatomy was defined. Under direct ultrasound visualization, the Argon Scorpion X needle was advanced into the central RIGHT portal vein. Despite multiple attempts, the hepatic vein and portal vein could not be placed into a single sonographic plane and there was an adequate purchase for transhepatic access. ULTRASOUND-GUIDED PORTAL VENOUS ACCESS: Evaluation of the RIGHT upper quadrant showed a patent portal vein. Under ultrasound guidance, a 15 cm 21-G needle was inserted into the portal vein. A 0.018 inch Nitinol guidewire was advanced through the needle. Hand injection of contrast confirmed position within the portal system. The track was dilated using a transitional dilator set, a 0.035 inch wire was inserted into the portal vein and over the wire a sheath was inserted. A snare was inserted and advanced to the level of the portal vein. "GUN SIGHT" TIPS SHUNT CREATION: A snare was also advanced through the transjugular sheath into the RIGHT hepatic vein. With meticulous triangulation, an oblique view was obtained which overlaid the snares upon each other and a 15 cm 21-gauge needle was advanced via direct/percutaneous/transhepatic approach through the snares. A perpendicular view was obtained which demonstrated that the needle was through both snares. A 0.018 inch guidewire was advanced through the needle into the RIGHT hepatic vein, snared, and pulled through the jugular access site. Using a CXI crossing catheter, portal venous access was obtained through the hepatic vein. Hand injection of contrast confirmed position within the portal system. A Glidewire was then advanced into the splenic vein. A 5 Fr marking pigtail catheter was then advanced over the wire into the main portal vein and wire removed. Portal venogram was performed which demonstrated a patent portal vein. The tract was then pre-dilated with 8 mm x 10  mm Athletis balloon. A 8-10 mm by 7 + 2 cm of GORE Viatorr TIPS stent was placed. After placement of the shunt, RIGHT atrial and portal manometry was performed. Completion portal venogram demonstrated a patent TIPS endograft without residual filling of the gastroesophageal varices. The catheters and sheath were removed and manual compression was applied to the RIGHT internal jugular and RIGHT common femoral venous access sites until hemostasis was achieved. The patient was transferred back to the ICU in stable condition. Post-TIPS Mean Pressures (mmHg): Right atrium:23 Portal vein: 30 Portosystemic gradient: 7 FINDINGS: *Access via the RIGHT jugular and greater saphenous veins. *Aborted ICE-guided TIPS attempt secondary to challenging venous anatomy. Accessory RIGHT hepatic vein the caudate without adequate purchase for transhepatic access. *Successful direct portal venous access and TIPS creation, via the accessory RIGHT hepatic vein to the RIGHT portal vein, with placement of a 7 cm GORE Viatorr stent. *Small volume perihepatic ascites. Paracentesis was NOT performed. IMPRESSION: 1. Successful creation of Transjugular Intrahepatic Portosystemic Shunt (TIPS) via 'Gun sight' technique and direct/percutaneous portal venous access,  under ultrasound and fluoroscopy. 2. Reduction of portal hypertension with a completion portosystemic gradient of 7 mm Hg. 3. No residual opacification of gastroesophageal varices. No variceal embolization was required or performed. PLAN: *Lactulose TID and continue to evaluate/monitor for hepatic encephalopathy. *The patient will be enrolled in the [facility] portal hypertension follow-up clinic, and will be closely followed per the protocol. Roanna Banning, MD [facility] Radiology Electronically Signed   By: Roanna Banning M.D.   On: 04/12/2023 12:33   IR US Guide Vasc Access Right  Result Date: 04/12/2023 CLINICAL DATA:  Hematemesis.  Upper GI  bleed. Briefly, 52 year old male with history significant for EtOH cirrhosis and esophageal varices s/p EGD with banding 03/01/2023 and plan for interval TIPS. Patient represented to ER with upper GI bleed requiring urgent EGD and repeat banding 04/08/2023. MELD = 16 (04/11/2023). EXAM: Procedures: 1. TRANSJUGULAR INTRAHEPATIC PORTOSYSTEMIC SHUNT (TIPS) 2. INTRACARDIAC ECHOCARDIOGRAPHY (ICE) 3. DIRECT (PERCUTANEOUS) PORTAL VENOUS ACCESS Performing Physician: Roanna Banning, MD Assistant(s): Malachy Moan, MD A qualified trainee/resident or advanced practice provider (APP) was not immediately available to assist with this case. MEDICATIONS: As antibiotic prophylaxis, Vancomycin 1 gm IV was ordered pre-procedure and administered intravenously within one hour of incision. ANESTHESIA/SEDATION: Sedation by the Anesthesia Team was performed. Please see anesthesiology log for details. CONTRAST:  145 mL Omnipaque 300, intravenous FLUOROSCOPY TIME:  Fluoroscopic dose; 1273 mGy COMPLICATIONS: None immediate. PROCEDURE: Informed written consent was obtained from the patient and/or patient's representative after a thorough discussion of the procedural risks, benefits and alternatives. All questions were addressed. Maximal Sterile Barrier Technique was utilized including caps, mask, sterile gowns, sterile gloves, sterile drape, hand hygiene and skin antiseptic. A timeout was performed prior to the initiation of the procedure. Initial ultrasound scanning demonstrates a small amount of ascites within the RIGHT abdomen. An image was saved for documentation purposes. Paracentesis was NOT performed. A preliminary ultrasound of the RIGHT groin was performed and demonstrates a patent RIGHT common femoral vein. A permanent ultrasound image was recorded. Using a combination of fluoroscopy and ultrasound, an access site was determined. A small dermatotomy was made at the planned puncture site. Using ultrasound guidance, access into the  RIGHT greater saphenous vein was obtained with visualization of needle entry into the vessel using a standard micropuncture technique. A wire was advanced into the IVC insert all fascial dilation performed. An 8 Fr, 11 cm vascular sheath was placed into the external iliac vein. Through this access site, an 8 Fr AcuNav ICE catheter was advanced with ease under fluoroscopic guidance to the level of the intrahepatic inferior vena cava. A preliminary ultrasound of the RIGHT neck was performed and demonstrates a patent internal jugular vein. A permanent ultrasound image was recorded. Using a combination of fluoroscopy and ultrasound, an access site was determined. A small dermatotomy was made at the planned puncture site. Using ultrasound guidance, access into the RIGHT internal jugular vein was obtained with visualization of needle entry into the vessel using a standard micropuncture technique. A wire was advanced into the IVC and serial fascial dilation performed. A 10 Fr 35 cm Cook TIPS sheath was placed into the internal jugular vein and advanced to the IVC. The jugular sheath was retracted into the RIGHT atrium and manometry was performed. HEPATIC VENOGRAM: A 5 Fr angled tip catheter was then directed into the RIGHT hepatic vein. Hepatic venogram was performed. These images demonstrated patent variant anatomy venous an accessory, inferior RIGHT hepatic vein at the level of the caudate.  No evidence of the hepatic venous stenosis. The catheter was advanced to a wedge portion of the a patent vein over which the sheath was advanced into the distal hepatic vein. Using ICE ultrasound visualization the catheter as RIGHT hepatic vein as well as the portal anatomy was defined. Under direct ultrasound visualization, the Argon Scorpion X needle was advanced into the central RIGHT portal vein. Despite multiple attempts, the hepatic vein and portal vein could not be placed into a single sonographic plane and there was an  adequate purchase for transhepatic access. ULTRASOUND-GUIDED PORTAL VENOUS ACCESS: Evaluation of the RIGHT upper quadrant showed a patent portal vein. Under ultrasound guidance, a 15 cm 21-G needle was inserted into the portal vein. A 0.018 inch Nitinol guidewire was advanced through the needle. Hand injection of contrast confirmed position within the portal system. The track was dilated using a transitional dilator set, a 0.035 inch wire was inserted into the portal vein and over the wire a sheath was inserted. A snare was inserted and advanced to the level of the portal vein. "GUN SIGHT" TIPS SHUNT CREATION: A snare was also advanced through the transjugular sheath into the RIGHT hepatic vein. With meticulous triangulation, an oblique view was obtained which overlaid the snares upon each other and a 15 cm 21-gauge needle was advanced via direct/percutaneous/transhepatic approach through the snares. A perpendicular view was obtained which demonstrated that the needle was through both snares. A 0.018 inch guidewire was advanced through the needle into the RIGHT hepatic vein, snared, and pulled through the jugular access site. Using a CXI crossing catheter, portal venous access was obtained through the hepatic vein. Hand injection of contrast confirmed position within the portal system. A Glidewire was then advanced into the splenic vein. A 5 Fr marking pigtail catheter was then advanced over the wire into the main portal vein and wire removed. Portal venogram was performed which demonstrated a patent portal vein. The tract was then pre-dilated with 8 mm x 10 mm Athletis balloon. A 8-10 mm by 7 + 2 cm of GORE Viatorr TIPS stent was placed. After placement of the shunt, RIGHT atrial and portal manometry was performed. Completion portal venogram demonstrated a patent TIPS endograft without residual filling of the gastroesophageal varices. The catheters and sheath were removed and manual compression was applied to the  RIGHT internal jugular and RIGHT common femoral venous access sites until hemostasis was achieved. The patient was transferred back to the ICU in stable condition. Post-TIPS Mean Pressures (mmHg): Right atrium:23 Portal vein: 30 Portosystemic gradient: 7 FINDINGS: *Access via the RIGHT jugular and greater saphenous veins. *Aborted ICE-guided TIPS attempt secondary to challenging venous anatomy. Accessory RIGHT hepatic vein the caudate without adequate purchase for transhepatic access. *Successful direct portal venous access and TIPS creation, via the accessory RIGHT hepatic vein to the RIGHT portal vein, with placement of a 7 cm GORE Viatorr stent. *Small volume perihepatic ascites. Paracentesis was NOT performed. IMPRESSION: 1. Successful creation of Transjugular Intrahepatic Portosystemic Shunt (TIPS) via 'Gun sight' technique and direct/percutaneous portal venous access, under ultrasound and fluoroscopy. 2. Reduction of portal hypertension with a completion portosystemic gradient of 7 mm Hg. 3. No residual opacification of gastroesophageal varices. No variceal embolization was required or performed. PLAN: *Lactulose TID and continue to evaluate/monitor for hepatic encephalopathy. *The patient will be enrolled in the [facility] portal hypertension follow-up clinic, and will be closely followed per the protocol. Roanna Banning, MD [facility] Radiology Electronically Signed   By: Cletis Athens  Mugweru M.D.   On: 04/12/2023 12:33   IR US Guide Vasc Access Right  Result Date: 04/12/2023 CLINICAL DATA:  Hematemesis.  Upper GI bleed. Briefly, 52 year old male with history significant for EtOH cirrhosis and esophageal varices s/p EGD with banding 03/01/2023 and plan for interval TIPS. Patient represented to ER with upper GI bleed requiring urgent EGD and repeat banding 04/08/2023. MELD = 16 (04/11/2023). EXAM: Procedures: 1. TRANSJUGULAR INTRAHEPATIC PORTOSYSTEMIC SHUNT (TIPS) 2.  INTRACARDIAC ECHOCARDIOGRAPHY (ICE) 3. DIRECT (PERCUTANEOUS) PORTAL VENOUS ACCESS Performing Physician: Roanna Banning, MD Assistant(s): Malachy Moan, MD A qualified trainee/resident or advanced practice provider (APP) was not immediately available to assist with this case. MEDICATIONS: As antibiotic prophylaxis, Vancomycin 1 gm IV was ordered pre-procedure and administered intravenously within one hour of incision. ANESTHESIA/SEDATION: Sedation by the Anesthesia Team was performed. Please see anesthesiology log for details. CONTRAST:  145 mL Omnipaque 300, intravenous FLUOROSCOPY TIME:  Fluoroscopic dose; 1273 mGy COMPLICATIONS: None immediate. PROCEDURE: Informed written consent was obtained from the patient and/or patient's representative after a thorough discussion of the procedural risks, benefits and alternatives. All questions were addressed. Maximal Sterile Barrier Technique was utilized including caps, mask, sterile gowns, sterile gloves, sterile drape, hand hygiene and skin antiseptic. A timeout was performed prior to the initiation of the procedure. Initial ultrasound scanning demonstrates a small amount of ascites within the RIGHT abdomen. An image was saved for documentation purposes. Paracentesis was NOT performed. A preliminary ultrasound of the RIGHT groin was performed and demonstrates a patent RIGHT common femoral vein. A permanent ultrasound image was recorded. Using a combination of fluoroscopy and ultrasound, an access site was determined. A small dermatotomy was made at the planned puncture site. Using ultrasound guidance, access into the RIGHT greater saphenous vein was obtained with visualization of needle entry into the vessel using a standard micropuncture technique. A wire was advanced into the IVC insert all fascial dilation performed. An 8 Fr, 11 cm vascular sheath was placed into the external iliac vein. Through this access site, an 8 Fr AcuNav ICE catheter was advanced with ease under  fluoroscopic guidance to the level of the intrahepatic inferior vena cava. A preliminary ultrasound of the RIGHT neck was performed and demonstrates a patent internal jugular vein. A permanent ultrasound image was recorded. Using a combination of fluoroscopy and ultrasound, an access site was determined. A small dermatotomy was made at the planned puncture site. Using ultrasound guidance, access into the RIGHT internal jugular vein was obtained with visualization of needle entry into the vessel using a standard micropuncture technique. A wire was advanced into the IVC and serial fascial dilation performed. A 10 Fr 35 cm Cook TIPS sheath was placed into the internal jugular vein and advanced to the IVC. The jugular sheath was retracted into the RIGHT atrium and manometry was performed. HEPATIC VENOGRAM: A 5 Fr angled tip catheter was then directed into the RIGHT hepatic vein. Hepatic venogram was performed. These images demonstrated patent variant anatomy venous an accessory, inferior RIGHT hepatic vein at the level of the caudate. No evidence of the hepatic venous stenosis. The catheter was advanced to a wedge portion of the a patent vein over which the sheath was advanced into the distal hepatic vein. Using ICE ultrasound visualization the catheter as RIGHT hepatic vein as well as the portal anatomy was defined. Under direct ultrasound visualization, the Argon Scorpion X needle was advanced into the central RIGHT portal vein. Despite multiple attempts, the hepatic vein and portal vein could not be placed  into a single sonographic plane and there was an adequate purchase for transhepatic access. ULTRASOUND-GUIDED PORTAL VENOUS ACCESS: Evaluation of the RIGHT upper quadrant showed a patent portal vein. Under ultrasound guidance, a 15 cm 21-G needle was inserted into the portal vein. A 0.018 inch Nitinol guidewire was advanced through the needle. Hand injection of contrast confirmed position within the portal system.  The track was dilated using a transitional dilator set, a 0.035 inch wire was inserted into the portal vein and over the wire a sheath was inserted. A snare was inserted and advanced to the level of the portal vein. "GUN SIGHT" TIPS SHUNT CREATION: A snare was also advanced through the transjugular sheath into the RIGHT hepatic vein. With meticulous triangulation, an oblique view was obtained which overlaid the snares upon each other and a 15 cm 21-gauge needle was advanced via direct/percutaneous/transhepatic approach through the snares. A perpendicular view was obtained which demonstrated that the needle was through both snares. A 0.018 inch guidewire was advanced through the needle into the RIGHT hepatic vein, snared, and pulled through the jugular access site. Using a CXI crossing catheter, portal venous access was obtained through the hepatic vein. Hand injection of contrast confirmed position within the portal system. A Glidewire was then advanced into the splenic vein. A 5 Fr marking pigtail catheter was then advanced over the wire into the main portal vein and wire removed. Portal venogram was performed which demonstrated a patent portal vein. The tract was then pre-dilated with 8 mm x 10 mm Athletis balloon. A 8-10 mm by 7 + 2 cm of GORE Viatorr TIPS stent was placed. After placement of the shunt, RIGHT atrial and portal manometry was performed. Completion portal venogram demonstrated a patent TIPS endograft without residual filling of the gastroesophageal varices. The catheters and sheath were removed and manual compression was applied to the RIGHT internal jugular and RIGHT common femoral venous access sites until hemostasis was achieved. The patient was transferred back to the ICU in stable condition. Post-TIPS Mean Pressures (mmHg): Right atrium:23 Portal vein: 30 Portosystemic gradient: 7 FINDINGS: *Access via the RIGHT jugular and greater saphenous veins. *Aborted ICE-guided TIPS attempt secondary to  challenging venous anatomy. Accessory RIGHT hepatic vein the caudate without adequate purchase for transhepatic access. *Successful direct portal venous access and TIPS creation, via the accessory RIGHT hepatic vein to the RIGHT portal vein, with placement of a 7 cm GORE Viatorr stent. *Small volume perihepatic ascites. Paracentesis was NOT performed. IMPRESSION: 1. Successful creation of Transjugular Intrahepatic Portosystemic Shunt (TIPS) via 'Gun sight' technique and direct/percutaneous portal venous access, under ultrasound and fluoroscopy. 2. Reduction of portal hypertension with a completion portosystemic gradient of 7 mm Hg. 3. No residual opacification of gastroesophageal varices. No variceal embolization was required or performed. PLAN: *Lactulose TID and continue to evaluate/monitor for hepatic encephalopathy. *The patient will be enrolled in the VIR portal hypertension follow-up clinic, and will be closely followed per the protocol. Roanna Banning, MD [facility] Radiology Electronically Signed   By: Roanna Banning M.D.   On: 04/12/2023 12:33   IR US Guide Vasc Access Right  Result Date: 04/12/2023 CLINICAL DATA:  Hematemesis.  Upper GI bleed. Briefly, 52 year old male with history significant for EtOH cirrhosis and esophageal varices s/p EGD with banding 03/01/2023 and plan for interval TIPS. Patient represented to ER with upper GI bleed requiring urgent EGD and repeat banding 04/08/2023. MELD = 16 (04/11/2023). EXAM: Procedures: 1. TRANSJUGULAR INTRAHEPATIC PORTOSYSTEMIC SHUNT (TIPS) 2. INTRACARDIAC  ECHOCARDIOGRAPHY (ICE) 3. DIRECT (PERCUTANEOUS) PORTAL VENOUS ACCESS Performing Physician: Roanna Banning, MD Assistant(s): Malachy Moan, MD A qualified trainee/resident or advanced practice provider (APP) was not immediately available to assist with this case. MEDICATIONS: As antibiotic prophylaxis, Vancomycin 1 gm IV was ordered pre-procedure and  administered intravenously within one hour of incision. ANESTHESIA/SEDATION: Sedation by the Anesthesia Team was performed. Please see anesthesiology log for details. CONTRAST:  145 mL Omnipaque 300, intravenous FLUOROSCOPY TIME:  Fluoroscopic dose; 1273 mGy COMPLICATIONS: None immediate. PROCEDURE: Informed written consent was obtained from the patient and/or patient's representative after a thorough discussion of the procedural risks, benefits and alternatives. All questions were addressed. Maximal Sterile Barrier Technique was utilized including caps, mask, sterile gowns, sterile gloves, sterile drape, hand hygiene and skin antiseptic. A timeout was performed prior to the initiation of the procedure. Initial ultrasound scanning demonstrates a small amount of ascites within the RIGHT abdomen. An image was saved for documentation purposes. Paracentesis was NOT performed. A preliminary ultrasound of the RIGHT groin was performed and demonstrates a patent RIGHT common femoral vein. A permanent ultrasound image was recorded. Using a combination of fluoroscopy and ultrasound, an access site was determined. A small dermatotomy was made at the planned puncture site. Using ultrasound guidance, access into the RIGHT greater saphenous vein was obtained with visualization of needle entry into the vessel using a standard micropuncture technique. A wire was advanced into the IVC insert all fascial dilation performed. An 8 Fr, 11 cm vascular sheath was placed into the external iliac vein. Through this access site, an 8 Fr AcuNav ICE catheter was advanced with ease under fluoroscopic guidance to the level of the intrahepatic inferior vena cava. A preliminary ultrasound of the RIGHT neck was performed and demonstrates a patent internal jugular vein. A permanent ultrasound image was recorded. Using a combination of fluoroscopy and ultrasound, an access site was determined. A small dermatotomy was made at the planned puncture site.  Using ultrasound guidance, access into the RIGHT internal jugular vein was obtained with visualization of needle entry into the vessel using a standard micropuncture technique. A wire was advanced into the IVC and serial fascial dilation performed. A 10 Fr 35 cm Cook TIPS sheath was placed into the internal jugular vein and advanced to the IVC. The jugular sheath was retracted into the RIGHT atrium and manometry was performed. HEPATIC VENOGRAM: A 5 Fr angled tip catheter was then directed into the RIGHT hepatic vein. Hepatic venogram was performed. These images demonstrated patent variant anatomy venous an accessory, inferior RIGHT hepatic vein at the level of the caudate. No evidence of the hepatic venous stenosis. The catheter was advanced to a wedge portion of the a patent vein over which the sheath was advanced into the distal hepatic vein. Using ICE ultrasound visualization the catheter as RIGHT hepatic vein as well as the portal anatomy was defined. Under direct ultrasound visualization, the Argon Scorpion X needle was advanced into the central RIGHT portal vein. Despite multiple attempts, the hepatic vein and portal vein could not be placed into a single sonographic plane and there was an adequate purchase for transhepatic access. ULTRASOUND-GUIDED PORTAL VENOUS ACCESS: Evaluation of the RIGHT upper quadrant showed a patent portal vein. Under ultrasound guidance, a 15 cm 21-G needle was inserted into the portal vein. A 0.018 inch Nitinol guidewire was advanced through the needle. Hand injection of contrast confirmed position within the portal system. The track was dilated using a transitional dilator set, a 0.035 inch wire was inserted  into the portal vein and over the wire a sheath was inserted. A snare was inserted and advanced to the level of the portal vein. "GUN SIGHT" TIPS SHUNT CREATION: A snare was also advanced through the transjugular sheath into the RIGHT hepatic vein. With meticulous  triangulation, an oblique view was obtained which overlaid the snares upon each other and a 15 cm 21-gauge needle was advanced via direct/percutaneous/transhepatic approach through the snares. A perpendicular view was obtained which demonstrated that the needle was through both snares. A 0.018 inch guidewire was advanced through the needle into the RIGHT hepatic vein, snared, and pulled through the jugular access site. Using a CXI crossing catheter, portal venous access was obtained through the hepatic vein. Hand injection of contrast confirmed position within the portal system. A Glidewire was then advanced into the splenic vein. A 5 Fr marking pigtail catheter was then advanced over the wire into the main portal vein and wire removed. Portal venogram was performed which demonstrated a patent portal vein. The tract was then pre-dilated with 8 mm x 10 mm Athletis balloon. A 8-10 mm by 7 + 2 cm of GORE Viatorr TIPS stent was placed. After placement of the shunt, RIGHT atrial and portal manometry was performed. Completion portal venogram demonstrated a patent TIPS endograft without residual filling of the gastroesophageal varices. The catheters and sheath were removed and manual compression was applied to the RIGHT internal jugular and RIGHT common femoral venous access sites until hemostasis was achieved. The patient was transferred back to the ICU in stable condition. Post-TIPS Mean Pressures (mmHg): Right atrium:23 Portal vein: 30 Portosystemic gradient: 7 FINDINGS: *Access via the RIGHT jugular and greater saphenous veins. *Aborted ICE-guided TIPS attempt secondary to challenging venous anatomy. Accessory RIGHT hepatic vein the caudate without adequate purchase for transhepatic access. *Successful direct portal venous access and TIPS creation, via the accessory RIGHT hepatic vein to the RIGHT portal vein, with placement of a 7 cm GORE Viatorr stent. *Small volume perihepatic ascites. Paracentesis was NOT  performed. IMPRESSION: 1. Successful creation of Transjugular Intrahepatic Portosystemic Shunt (TIPS) via 'Gun sight' technique and direct/percutaneous portal venous access, under ultrasound and fluoroscopy. 2. Reduction of portal hypertension with a completion portosystemic gradient of 7 mm Hg. 3. No residual opacification of gastroesophageal varices. No variceal embolization was required or performed. PLAN: *Lactulose TID and continue to evaluate/monitor for hepatic encephalopathy. *The patient will be enrolled in the VIR portal hypertension follow-up clinic, and will be closely followed per the protocol. Roanna Banning, MD [facility] Radiology Electronically Signed   By: Roanna Banning M.D.   On: 04/12/2023 12:33   DG Abd Portable 1V  Result Date: 04/12/2023 CLINICAL DATA:  Tube placement EXAM: PORTABLE ABDOMEN - 1 VIEW COMPARISON:  02/28/2023 FINDINGS: Image of the upper abdomen demonstrates N/OG tube tip superimposed with the stomach below the diaphragm. IMPRESSION: N/OG tube in place. Electronically Signed   By: Layla Maw M.D.   On: 04/12/2023 11:06   Portable Chest x-ray  Result Date: 04/12/2023 CLINICAL DATA:  Check endotracheal tube placement EXAM: PORTABLE CHEST 1 VIEW COMPARISON:  04/11/2023 FINDINGS: Left jugular central line is again noted in the mid superior vena cava. Endotracheal tube is seen 3.2 cm above the carina. Cardiac shadow is enlarged but stable. Diffuse increased airspace opacity is noted bilaterally most consistent with worsening edema. No pneumothorax is seen. No bony abnormality is noted. IMPRESSION: Endotracheal tube in satisfactory position. Worsening pulmonary edema. Electronically Signed   By: Loraine Leriche  Lukens M.D.   On: 04/12/2023 03:35   ECHOCARDIOGRAM COMPLETE  Result Date: 04/11/2023    ECHOCARDIOGRAM REPORT   Patient Name:   Ethan Wells Date of Exam: 04/11/2023 Medical Rec #:  [id number]           Height:        65.0 in Accession #:    [id number]          Weight:       197.3 lb Date of Birth:  03-17-1971           BSA:          1.967 m Patient Age:    51 years            BP:           115/68 mmHg Patient Gender: M                   HR:           89 bpm. Exam Location:  Inpatient Procedure: 2D Echo, Cardiac Doppler, Color Doppler and Intracardiac            Opacification Agent Indications:    Other abnormalities of the heart R00.8  History:        Patient has prior history of Echocardiogram examinations. CAD;                 Risk Factors:Hypertension and Dyslipidemia.  Sonographer:    Mike Gip Referring Phys: [phone] JENNIFER C OMOHUNDRO IMPRESSIONS  1. Left ventricular ejection fraction, by estimation, is 55 to 60%. The left ventricle has normal function. The left ventricle has no regional wall motion abnormalities. There is mild left ventricular hypertrophy. Left ventricular diastolic parameters are consistent with Grade II diastolic dysfunction (pseudonormalization).  2. Right ventricular systolic function is normal. The right ventricular size is normal. There is normal pulmonary artery systolic pressure.  3. Anterior leaflet has increased thickening, likely calcification. Mild mitral valve regurgitation.  4. The aortic valve is tricuspid. Aortic valve regurgitation is not visualized.  5. The inferior vena cava is normal in size with greater than 50% respiratory variability, suggesting right atrial pressure of 3 mmHg. FINDINGS  Left Ventricle: Left ventricular ejection fraction, by estimation, is 55 to 60%. The left ventricle has normal function. The left ventricle has no regional wall motion abnormalities. Definity contrast agent was given IV to delineate the left ventricular  endocardial borders. The left ventricular internal cavity size was normal in size. There is mild left ventricular hypertrophy. Left ventricular diastolic parameters are consistent with Grade II diastolic dysfunction (pseudonormalization). Right  Ventricle: The right ventricular size is normal. Right ventricular systolic function is normal. There is normal pulmonary artery systolic pressure. The tricuspid regurgitant velocity is 2.54 m/s, and with an assumed right atrial pressure of 3 mmHg,  the estimated right ventricular systolic pressure is 28.8 mmHg. Left Atrium: Left atrial size was normal in size. Right Atrium: Right atrial size was normal in size. Pericardium: There is no evidence of pericardial effusion. Mitral Valve: Anterior leaflet has increased thickening, likely calcification. Mild mitral valve regurgitation. Tricuspid Valve: Tricuspid valve regurgitation is mild. Aortic Valve: The aortic valve is tricuspid. Aortic valve regurgitation is not visualized. Pulmonic Valve: Pulmonic valve regurgitation is not visualized. Aorta: The aortic root and ascending aorta are structurally normal, with no evidence of dilitation. Venous: The inferior vena cava is normal in size with greater than 50% respiratory variability, suggesting right atrial pressure of 3 mmHg.  IAS/Shunts: No atrial level shunt detected by color flow Doppler.  LEFT VENTRICLE PLAX 2D LVIDd:         5.80 cm      Diastology LVIDs:         4.60 cm      LV e' medial:    4.35 cm/s LV PW:         1.10 cm      LV E/e' medial:  22.0 LV IVS:        1.00 cm      LV e' lateral:   8.59 cm/s LVOT diam:     2.20 cm      LV E/e' lateral: 11.1 LV SV:         87 LV SV Index:   44 LVOT Area:     3.80 cm  LV Volumes (MOD) LV vol d, MOD A2C: 156.0 ml LV vol d, MOD A4C: 149.0 ml LV vol s, MOD A2C: 69.6 ml LV vol s, MOD A4C: 68.0 ml LV SV MOD A2C:     86.4 ml LV SV MOD A4C:     149.0 ml LV SV MOD BP:      87.6 ml RIGHT VENTRICLE             IVC RV Basal diam:  4.20 cm     IVC diam: 1.80 cm RV S prime:     12.30 cm/s TAPSE (M-mode): 2.0 cm LEFT ATRIUM             Index        RIGHT ATRIUM           Index LA diam:        3.90 cm 1.98 cm/m   RA Area:     20.40 cm LA Vol (A2C):   66.0 ml 33.56 ml/m  RA Volume:    55.90 ml  28.42 ml/m LA Vol (A4C):   59.3 ml 30.15 ml/m LA Biplane Vol: 62.5 ml 31.78 ml/m  AORTIC VALVE LVOT Vmax:   102.00 cm/s LVOT Vmean:  68.700 cm/s LVOT VTI:    0.229 m  AORTA Ao Root diam: 3.30 cm Ao Asc diam:  3.10 cm MITRAL VALVE               TRICUSPID VALVE MV Area (PHT): 4.60 cm    TR Peak grad:   25.8 mmHg MV Decel Time: 165 msec    TR Vmax:        254.00 cm/s MV E velocity: 95.50 cm/s MV A velocity: 32.60 cm/s  SHUNTS MV E/A ratio:  2.93        Systemic VTI:  0.23 m                            Systemic Diam: 2.20 cm Carolan Clines Electronically signed by Carolan Clines Signature Date/Time: 04/11/2023/10:06:14 AM    Final    DG Chest Port 1 View  Result Date: 04/11/2023 CLINICAL DATA:  5626 Acute respiratory failure 5626 EXAM: PORTABLE CHEST 1 VIEW COMPARISON:  Chest XR, 04/09/2023.  CT AP, 01/17/2023. FINDINGS: Support lines: Interval extubation. LEFT IJ CVC with catheter tip well-positioned at the superior cavoatrial junction. Cardiomegaly. Perihilar and central bronchial wall thickening. Minimal bibasilar interstitial opacities. No pleural effusion or pneumothorax. No interval osseous abnormality. IMPRESSION: 1. Extubation.  Stable, well-positioned LEFT IJ-approach CVC. 2. Cardiomegaly with perihilar thickening and minimal basilar opacities. Findings favored to represent mild pulmonary edema and  atelectasis. Electronically Signed   By: Roanna Banning M.D.   On: 04/11/2023 07:30   DG Chest Port 1 View  Result Date: 04/09/2023 CLINICAL DATA:  Hypoxia EXAM: PORTABLE CHEST 1 VIEW COMPARISON:  04/09/2023 FINDINGS: Endotracheal tube is again noted and stable. Left jugular central line are again seen in satisfactory position. Significant improvement in the degree of congestive failure is noted. Some residual basilar opacities are seen. No bony abnormality is noted. IMPRESSION: Near complete resolution of previously seen pulmonary edema. Electronically Signed   By: Alcide Clever M.D.   On: 04/09/2023  12:12   DG CHEST PORT 1 VIEW  Result Date: 04/09/2023 CLINICAL DATA:  Respiratory failure, prone repositioning EXAM: PORTABLE CHEST 1 VIEW COMPARISON:  04/08/2023 FINDINGS: Endotracheal tube is seen 14 mm above the carina. Left internal jugular central venous catheter tip noted overlying the expected superior vena cava. Pulmonary insufflation is stable. Superimposed extensive bilateral perihilar and lower lung zone consolidation in keeping with severe alveolar pulmonary edema or diffuse pneumonic infiltrate has progressed in the interval since prior examination. No pneumothorax or pleural effusion. Cardiac size within normal limits. IMPRESSION: 1. Endotracheal tube 14 mm above the carina. 2. Progressive bilateral perihilar and lower lung zone consolidation, severe alveolar pulmonary edema or diffuse pneumonic infiltrate. Electronically Signed   By: Helyn Numbers M.D.   On: 04/09/2023 03:01   DG CHEST PORT 1 VIEW  Result Date: 04/08/2023 CLINICAL DATA:  Hypoxia EXAM: PORTABLE CHEST 1 VIEW COMPARISON:  04/07/2023 FINDINGS: The endotracheal tube has been placed with tip measuring 3.8 cm above the carina. Left central venous catheter with tip over the low SVC region. No pneumothorax. Cardiac enlargement with bilateral perihilar infiltrates, progressing since prior study, likely edema. No pleural effusions. No pneumothorax. Mediastinal contours appear intact. IMPRESSION: 1. Appliances appear in satisfactory position. 2. Cardiac enlargement with perihilar infiltration increasing since prior study, likely edema. Electronically Signed   By: Burman Nieves M.D.   On: 04/08/2023 22:04   DG Chest Port 1 View  Result Date: 04/07/2023 CLINICAL DATA:  Hematemesis and abdominal pain EXAM: PORTABLE CHEST 1 VIEW COMPARISON:  01/18/2023 FINDINGS: Unchanged cardiomegaly. No new focal pulmonary opacity. Chronic coarsened interstitial markings without overt pulmonary edema. No pleural effusion or pneumothorax. No acute  osseous abnormality. IMPRESSION: Unchanged cardiomegaly. No acute cardiopulmonary process. Electronically Signed   By: Wiliam Ke M.D.   On: 04/07/2023 11:27     TODAY-DAY OF DISCHARGE:  Subjective:   Ethan Wells today has no headache,no chest abdominal pain,no new weakness tingling or numbness, feels much better wants to go home today.   Objective:   Blood pressure 101/60, pulse 92, temperature 98.1 F (36.7 C), temperature source Axillary, resp. rate 20, height 5\' 5"  (1.651 m), weight 85 kg, SpO2 99 %.  Intake/Output Summary (Last 24 hours) at 04/28/2023 1227 Last data filed at 04/28/2023 0544 Gross per 24 hour  Intake 1200 ml  Output 1725 ml  Net -525 ml   Filed Weights   04/24/23 0437 04/27/23 0703 04/28/23 0219  Weight: 84.7 kg 84.5 kg 85 kg    Exam: Awake Alert, Oriented *3, No new F.N deficits, Normal affect [place].AT,PERRAL Supple Neck,No JVD, No cervical lymphadenopathy appriciated.  Symmetrical Chest wall movement, Good air movement bilaterally, CTAB RRR,No Gallops,Rubs or new Murmurs, No Parasternal Heave +ve B.Sounds, Abd Soft, Non tender, No organomegaly appriciated, No rebound -guarding or rigidity. No Cyanosis, Clubbing or edema, No new Rash or bruise   PERTINENT RADIOLOGIC STUDIES: No results found.  PERTINENT LAB RESULTS: CBC: Recent Labs    04/26/23 0807  WBC 8.1  HGB 9.3*  HCT 28.9*  PLT 232   CMET CMP     Component Value Date/Time   NA 134 (L) 04/26/2023 0807   NA 134 (L) 09/06/2013 1458   K 3.7 04/26/2023 0807   K 3.5 09/06/2013 1458   CL 106 04/26/2023 0807   CL 102 09/06/2013 1458   CO2 20 (L) 04/26/2023 0807   CO2 23 09/06/2013 1458   GLUCOSE 169 (H) 04/26/2023 0807   GLUCOSE 167 (H) 09/06/2013 1458   BUN 8 04/26/2023 0807   BUN 4 (L) 09/06/2013 1458   CREATININE 0.61 04/26/2023 0807   CREATININE 0.49 (L) 09/23/2022 1034   CALCIUM 8.5 (L) 04/26/2023 0807   CALCIUM 8.9 09/06/2013 1458   PROT 6.7 04/26/2023 0807   PROT  8.2 09/06/2013 1458   ALBUMIN 2.1 (L) 04/26/2023 0807   ALBUMIN 4.0 09/06/2013 1458   AST 44 (H) 04/26/2023 0807   AST 24 09/06/2013 1458   ALT 33 04/26/2023 0807   ALT 21 09/06/2013 1458   ALKPHOS 160 (H) 04/26/2023 0807   ALKPHOS 108 09/06/2013 1458   BILITOT 1.5 (H) 04/26/2023 0807   BILITOT 0.5 09/06/2013 1458   GFRNONAA >60 04/26/2023 0807   GFRNONAA >60 09/06/2013 1458   GFRAA >60 06/05/2020 1109   GFRAA >60 09/06/2013 1458    GFR Estimated Creatinine Clearance: 110.8 mL/min (by C-G formula based on SCr of 0.61 mg/dL). No results for input(s): "LIPASE", "AMYLASE" in the last 72 hours. No results for input(s): "CKTOTAL", "CKMB", "CKMBINDEX", "TROPONINI" in the last 72 hours. Invalid input(s): "POCBNP" No results for input(s): "DDIMER" in the last 72 hours. No results for input(s): "HGBA1C" in the last 72 hours. No results for input(s): "CHOL", "HDL", "LDLCALC", "TRIG", "CHOLHDL", "LDLDIRECT" in the last 72 hours. No results for input(s): "TSH", "T4TOTAL", "T3FREE", "THYROIDAB" in the last 72 hours.  Invalid input(s): "FREET3" No results for input(s): "VITAMINB12", "FOLATE", "FERRITIN", "TIBC", "IRON", "RETICCTPCT" in the last 72 hours. Coags: No results for input(s): "INR" in the last 72 hours.  Invalid input(s): "PT" Microbiology: No results found for this or any previous visit (from the past 240 hour(s)).  FURTHER DISCHARGE INSTRUCTIONS:  Get Medicines reviewed and adjusted: Please take all your medications with you for your next visit with your Primary MD  Laboratory/radiological data: Please request your Primary MD to go over all hospital tests and procedure/radiological results at the follow up, please ask your Primary MD to get all Hospital records sent to his/her office.  In some cases, they will be blood work, cultures and biopsy results pending at the time of your discharge. Please request that your primary care M.D. goes through all the records of your  hospital data and follows up on these results.  Also Note the following: If you experience worsening of your admission symptoms, develop shortness of breath, life threatening emergency, suicidal or homicidal thoughts you must seek medical attention immediately by calling 911 or calling your MD immediately  if symptoms less severe.  You must read complete instructions/literature along with all the possible adverse reactions/side effects for all the Medicines you take and that have been prescribed to you. Take any new Medicines after you have completely understood and accpet all the possible adverse reactions/side effects.   Do not drive when taking Pain medications or sleeping medications (Benzodaizepines)  Do not take more than prescribed Pain, Sleep and Anxiety Medications. It is not advisable  to combine anxiety,sleep and pain medications without talking with your primary care practitioner  Special Instructions: If you have smoked or chewed Tobacco  in the last 2 yrs please stop smoking, stop any regular Alcohol  and or any Recreational drug use.  Wear Seat belts while driving.  Please note: You were cared for by a hospitalist during your hospital stay. Once you are discharged, your primary care physician will handle any further medical issues. Please note that NO REFILLS for any discharge medications will be authorized once you are discharged, as it is imperative that you return to your primary care physician (or establish a relationship with a primary care physician if you do not have one) for your post hospital discharge needs so that they can reassess your need for medications and monitor your lab values.  Total Time spent coordinating discharge including counseling, education and face to face time equals greater than 30 minutes.  SignedJeoffrey Massed 04/28/2023 12:27 PM

## 2023-04-28 NOTE — Consult Note (Signed)
[facility] CM Inpatient Consult   04/28/2023  Ethan Wells Apr 27, 1971 [id number]  Referral:  [facility] Care Coordinator  [facility] [[facility]]  Accountable Care Organization [ACO] [name] [SNP]  Primary Care Provider: [name], NP, [facility]  *Patient was listed as [employer] SNP however is being followed by [facility] Care Coordinator  Patient evaluated for community based chronic complex disease management services with [facility] Care Management Program as a benefit of patient's [employer]. Spoke with patient at the bedside, up in recliner eating lunch.  HIPAA confirmed. Explained that [facility] Care Coordinators and team has reached out to him on several occasion and he acknowledges this. However, spoke with patient regarding post hospital follow up and he states, "That phone number is no longer a working phone cause it was on a free service plan and so I hadn't used it and now it no longer works."  Asked if he wanted to reactivate the phone plan and that [facility] Care Coordinators could assist as well as trying to get another contact number while in community. Then patient states, "well the phone plan was my mom's and all of my contact information is on it."  Asked if this writer could contact his mom then he states, "well my mom doesn't have her other phone either."  Asked if there was anyone at his boarding facility or family that could give him a message.  He states, "no, I don't want anyone involved like that.  I have a cousin but she works late hours at a call center and she doesn't want to be bother after talking to people all day."  Explored with the patient ways to get phone access and he states, "I'll just figure it out." Reviewed with patient regarding transportation and he states, "I drive, just not much, and I don't drive outside of [place]." He endorses his PCP which is in [place] and he states, "I might have to rethink having one in  [place] but, I'll figure it out." Gave him the appointment reminder card with 24 hour nurse advise line with provider's contact information,  and he states, there's really no need til I get a phone and my screen is cracked on it and I don't want to spend $100 to get it fixed." Patient getting a phone call at the bedside. Asked patient regarding getting a ride back to boarding facility in [place].  He states, "I have a ride coming a 10 am tomorrow. My nephew lives in [place]."  Attempts to find out if the person is going to take him to outpatient rehab since he is refusing Home Health to come to the boarding facility.  He states, "I drive if it's [rehab center] in [place], [place] figure it out." Patient refuses to share any phone numbers for follow up with anyone.   4:20 pm Met at beside, regarding post hospital PCP.  Patient states he had to get a PCP in [place] because he had "missed at lot of appointments." Spoke with him regarding clinics he may want to check out in his area and he states that he had a few that has dismissed him.  But states, " I know it's like I'm giving you a lot of excuses but I will know better wen I get out of here and get things going. I'm used to doing things myself."    Plan:  Will update [facility] RN CC of findings and for ongoing suggestions for the [facility] Care Coordinator  to reach out to patient.     Of note, [facility] Care Management services does not replace or interfere with any services that are arranged by inpatient case management or social work.  For additional questions or referrals please contact:    Ethan Shanks, RN BSN CCM [facility]  [phone] business mobile phone Toll free office [phone]  *Concierge Line  [phone] Fax number: [phone] [email or web address]

## 2023-04-28 NOTE — TOC Progression Note (Signed)
Transition of Care [facility]) - Progression Note    Patient Details  Name: CURBY BADY MRN: [id number] Date of Birth: 03-25-1971  Transition of Care [facility]) CM/SW Contact  Gordy Clement, RN Phone Number: 04/28/2023, 12:04 PM  Clinical Narrative:     Patient will dc to home. Patient has been recommended HH PT and OT but he declines anyone coming to his [employer]. OP services will be referred.  Patient has requested a rollator- CM has requested one from [employer] but not sure if insurance will cover.  If there is any cost, patient will just continue to use the RW he has at home . TOC will follow for any last minute dc needs  Plan is to go in am with Nephew providing transportation       Barriers to Discharge: Continued Medical Work up (Substance use)  Expected Discharge Plan and Services                                               Social Determinants of Health (SDOH) Interventions SDOH Screenings   Food Insecurity: No Food Insecurity (04/27/2023)  Housing: Low Risk  (04/27/2023)  Transportation Needs: No Transportation Needs (04/27/2023)  Utilities: Not At Risk (04/27/2023)  Alcohol Screen: Medium Risk (10/01/2022)  Tobacco Use: High Risk (04/13/2023)    Readmission Risk Interventions    02/24/2023    3:12 PM 01/17/2023    3:38 PM 10/22/2022   10:47 AM  Readmission Risk Prevention Plan  Transportation Screening Complete Complete Complete  PCP or Specialist Appt within 3-5 Days   Complete  Social Work Consult for Recovery Care Planning/Counseling   Complete  Palliative Care Screening   Not Applicable  Medication Review Oceanographer) Complete Complete Complete  SW Recovery Care/Counseling Consult  Not Complete   SW Consult Not Complete Comments  lethargic   Palliative Care Screening Not Applicable Not Applicable   Skilled Nursing Facility Not Applicable Not Applicable

## 2023-04-28 NOTE — Progress Notes (Signed)
Nutrition Follow-up  DOCUMENTATION CODES:   Not applicable  INTERVENTION:  Discontinue Boost Breeze and ProSource TF 30 ml ProSource Plus BID, each supplement provides 100 kcals and 15 grams protein.  Continue Folic acid, Thiamine, and Multivitamin w/ minerals daily Meal ordering with assist  NUTRITION DIAGNOSIS:   Inadequate oral intake related to inability to eat as evidenced by NPO status. - Progressing, now on a diet  GOAL:   Patient will meet greater than or equal to 90% of their needs - Being addressed via TF and PO  MONITOR:   PO intake, Supplement acceptance, Labs, I & O's  REASON FOR ASSESSMENT:   Ventilator    ASSESSMENT:   52 y.o. male presented to [facility] ED with acute GI bleed. Pt recently admitted for GI bleed one month prior. PMH includes HTN, CAD, EtOH abuse, polysubstance abuse, CHD, alcohol cirrhosis w/ esophageal varices, and HLD.  04/22 - admitted 04/23 - intubated 04/25 - extubated, diet advanced to clear liquids 04/26 - NPO s/p TIPS, remained intubated post-procedure 04/30 - extubated, NG tube placed 05/01 - pt pulled NG tube, Cortrak placed (tip at pylorus or proximal duodenum), SLP recommending NPO; transferred to floor 05/06 - MBS; diet advanced to regular, thin liquids 05/09 - transitioned to nocturnal TF  05/11 - Cortrak removed  Pt sitting up in chair. Reports that he is eating, happy that his tube is out. No nausea or vomiting. Pt requesting to go home and speak with MD; this RD notified MD of pt request.   Spoke with RN, pt eating much better. Ate ~75% of his breakfast, does not like the [employer]. Switch to ProSource Plus.  Meal Intakes  5/9-5/11: 7-100% x 7 meals  Medications reviewed and include: Folic acid, Lasix, NovoLog SSI, Lactulose, MVI, Protonix, Potassium Chloride, Spironolactone, Thiamine Labs reviewed: Sodium 133, Potassium 4.1, 24 hr CBGs 131-238  Diet Order:   Diet Order             Diet - low sodium heart healthy            Diet regular Room service appropriate? Yes with Assist; Fluid consistency: Thin  Diet effective now                   EDUCATION NEEDS:   Not appropriate for education at this time  Skin:  Skin Assessment: Reviewed RN Assessment (scattered closed incisions)  Last BM:  5/12 - Type 6  Height:   Ht Readings from Last 1 Encounters:  04/13/23 5\' 5"  (1.651 m)    Weight:   Wt Readings from Last 1 Encounters:  04/28/23 85 kg   Ideal Body Weight:  61.8 kg  BMI:  Body mass index is 31.18 kg/m.  Estimated Nutritional Needs:  Kcal:  2000-2200 Protein:  100-120 grams Fluid:  >/= 2 L   Kirby Crigler RD, LDN Clinical Dietitian See [facility] for contact information.

## 2023-04-28 NOTE — Progress Notes (Signed)
Occupational Therapy Treatment Patient Details Name: Ethan Wells MRN: [id number] DOB: 13-Oct-1971 Today's Date: 04/28/2023   History of present illness Ethan Wells is a 52 yo male presented to [facility] ED 4/22 for acute GI bleed. Of note patient was seen within this health sytem 1 month ago for significant GI bleed requiring banding per GI with TIPS procedure recommended but pt declined. Pt transferred to [facility] due to sepsis resulting in VDRF 4/23-4/25.  Patient reports last drink and cocaine use prior to admit was day prior to admission.   TIPS procedure 4/26  with pt VDRF 4/26-4/29.    PMH significant for recurrent GI bleed with known variceal bleed, ischemic cardiomyopathy, EtOH abuse, polysubstance abuse, HTN, HLD, depression, and anxiety   OT comments  Pt completed LB dressing in sitting with set up, UB dressing in standing with min assist, toileting with min assist for clothing and set up for pericare in sitting. He ambulated with RW in room with supervision. HR to 125 with activity. Pt eager to go home. Will need to ensure pt can manage his medications when he returns home. Updated d/c recommendation to HHOT.    Recommendations for follow up therapy are one component of a multi-disciplinary discharge planning process, led by the attending physician.  Recommendations may be updated based on patient status, additional functional criteria and insurance authorization.    Assistance Recommended at Discharge Intermittent Supervision/Assistance  Patient can return home with the following  Assistance with cooking/housework;Direct supervision/assist for medications management;Assist for transportation;Direct supervision/assist for financial management   Equipment Recommendations  None recommended by OT    Recommendations for Other Services      Precautions / Restrictions Precautions Precautions: Fall       Mobility Bed Mobility Overal bed mobility: Needs Assistance Bed Mobility:  Supine to Sit     Supine to sit: Supervision     General bed mobility comments: no assist, gets close to EOB    Transfers Overall transfer level: Needs assistance Equipment used: Rolling walker (2 wheels) Transfers: Sit to/from Stand Sit to Stand: Supervision           General transfer comment: no assist, supervision for safety and lines     Balance Overall balance assessment: Needs assistance   Sitting balance-Leahy Scale: Good Sitting balance - Comments: no LOB donning socks     Standing balance-Leahy Scale: Poor Standing balance comment: reliant on RW, but can stand without UE support for pericare and washing hands a sink                           ADL either performed or assessed with clinical judgement   ADL Overall ADL's : Needs assistance/impaired Eating/Feeding: Independent;Sitting   Grooming: Supervision/safety;Wash/dry hands;Standing   Upper Body Bathing: Set up;Sitting   Lower Body Bathing: Supervison/ safety;Sit to/from stand   Upper Body Dressing : Standing;Minimal assistance   Lower Body Dressing: Set up;Sitting/lateral leans   Toilet Transfer: Supervision/safety;Ambulation;Regular Toilet   Toileting- Clothing Manipulation and Hygiene: Minimal assistance;Sit to/from stand       Functional mobility during ADLs: Supervision/safety;Rolling walker (2 wheels)      Extremity/Trunk Assessment              Vision       Perception     Praxis      Cognition Arousal/Alertness: Awake/alert Behavior During Therapy: Flat affect Overall Cognitive Status: No family/caregiver present to determine baseline cognitive functioning  General Comments: slow processing, does not recall most of hospitalization, decreased awareness of deficits        Exercises      Shoulder Instructions       General Comments      Pertinent Vitals/ Pain       Pain Assessment Pain Assessment: No/denies  pain  Home Living                                          Prior Functioning/Environment              Frequency  Min 2X/week        Progress Toward Goals  OT Goals(current goals can now be found in the care plan section)  Progress towards OT goals: Progressing toward goals  Acute Rehab OT Goals OT Goal Formulation: With patient Time For Goal Achievement: 05/07/23 Potential to Achieve Goals: Good ADL Goals Pt Will Perform Grooming: with supervision;sitting Pt Will Perform Upper Body Bathing: with supervision;sitting Pt Will Perform Lower Body Bathing: with min assist;sit to/from stand Pt Will Transfer to Toilet: with min guard assist;bedside commode Pt Will Perform Toileting - Clothing Manipulation and hygiene: with min assist;sit to/from stand  Plan      Co-evaluation                 AM-PAC OT "6 Clicks" Daily Activity     Outcome Measure   Help from another person eating meals?: None Help from another person taking care of personal grooming?: A Little Help from another person toileting, which includes using toliet, bedpan, or urinal?: A Little Help from another person bathing (including washing, rinsing, drying)?: A Little Help from another person to put on and taking off regular upper body clothing?: A Little Help from another person to put on and taking off regular lower body clothing?: None 6 Click Score: 20    End of Session Equipment Utilized During Treatment: Rolling walker (2 wheels);Gait belt  OT Visit Diagnosis: Muscle weakness (generalized) (M62.81);Unsteadiness on feet (R26.81);Other symptoms and signs involving cognitive function   Activity Tolerance Patient tolerated treatment well   Patient Left in chair;with call bell/phone within reach;with chair alarm set   Nurse Communication          Time: 1610-9604 OT Time Calculation (min): 44 min  Charges: OT General Charges $OT Visit: 1 Visit OT Treatments $Self  Care/Home Management : 38-52 mins  Berna Spare, OTR/L Acute Rehabilitation Services Office: [phone]  Evern Bio 04/28/2023, 11:25 AM

## 2023-04-28 NOTE — Progress Notes (Signed)
PROGRESS NOTE        PATIENT DETAILS Name: Ethan Wells Age: 52 y.o. Sex: male Date of Birth: March 23, 1971 Admit Date: 04/07/2023 Admitting Physician Starleen Arms, MD ZOX:[id number], Margit Banda, NP  Brief Summary: Patient is a 52 y.o.  male with history of polysubstance abuse/EtOH use, small amount of sludge within the gallbladder.  Who presented with GI bleeding-underwent EGD with banding-Hospital course complicated by acute hypoxic respiratory failure requiring intubation.  See below for further details.  Significant events: 4/22>> admitted with acute GI bleed  4/23>> EGD >>  grade 2 varices, banded x 2, portal gastropathy 4/23 >>intubated 4/25>> extubated 4/27>> TIPS, re intubated for procedure. Did not meet extubation criteria and was returned to ICU on vent.  4/30>> NGT placed, extubated 5/02 >> transferred to [facility] 5/06>> MBS evaluation, started on regular diet 5/11>>Cortrak tube removed  Significant studies: 4/26>> TTE: EF 55-60%, grade 2 diastolic dysfunction 5/02>> RUQ ultrasound: Cirrhosis-small amount of sludge in gallbladder.  Significant microbiology data: 4/28>> tracheal aspirate: No organisms  Procedures: 4/23>> EGD 4/27>> TIPS  Consults: PCCM GI IR  Subjective: Awake/alert-requesting discharge.  Objective: Vitals: Blood pressure 101/60, pulse 92, temperature 98.1 F (36.7 C), temperature source Axillary, resp. rate 20, height 5\' 5"  (1.651 m), weight 85 kg, SpO2 99 %.   Exam: Gen Exam:Alert awake-not in any distress HEENT:atraumatic, normocephalic Chest: B/L clear to auscultation anteriorly CVS:S1S2 regular Abdomen:soft non tender, non distended Extremities:no edema Neurology: Non focal Skin: no rash  Pertinent Labs/Radiology:    Latest Ref Rng & Units 04/26/2023    8:07 AM 04/24/2023    6:42 AM 04/23/2023    4:11 AM  CBC  WBC 4.0 - 10.5 K/uL 8.1  9.4  10.6   Hemoglobin 13.0 - 17.0 g/dL 9.3  9.3  91.4    Hematocrit 39.0 - 52.0 % 28.9  29.4  31.8   Platelets 150 - 400 K/uL 232  207  183     Lab Results  Component Value Date   NA 134 (L) 04/26/2023   K 3.7 04/26/2023   CL 106 04/26/2023   CO2 20 (L) 04/26/2023      Assessment/Plan: Acute hypoxic respiratory failure Resolved-on room air Per prior chart review-thought to have acute pulmonary edema-related to transfusion.  Upper GI bleed-secondary to esophageal varices/portal gastropathy Acute blood loss anemia in the setting of GI bleeding S/p EGD with banding on 4/23 and TIPS procedure on 4/27 GI bleeding seems to have resolved Hb stable Continue PPI Follow CBC periodically.  Acute hepatic encephalopathy Has significantly improved over the past several days-completely awake/alert-walking the hallway this morning with mobility staff Continue rifaximin/lactulose.   Alcoholic liver cirrhosis-with portal gastropathy, hypersplenism induced thrombocytopenia Volume status relatively stable Continue Lasix/Aldactone  Chronic HFpEF Volume status relatively stable Continue diuretics  History of CAD-s/p STEMI with V-fib arrest 2012-s/p LAD PCI Non-STEMI 11/04/2022-LHC with patent LAD stent-medical management recommended by cardiology Not on antiplatelets given severity of GI bleeding Continue statin  Adrenal insufficiency Continue hydrocortisone  Mood disorder Seroquel  EtOH use Polysubstance abuse Drug screen positive for benzo's/cocaine Should be out of window for withdrawal symptoms  Deconditioning related to acute illness Oropharyngeal dysphagia related to acute illness/muscular deconditioning Discussed with patient/nursing staff-significant improvement oral intake-discontinue NG tube PT/OT-SNF recommended-however he is rapidly improved-and plan is to discharge him home with outpatient physical therapy.  Nutrition Status:  Nutrition Problem: Inadequate oral intake Etiology: inability to eat Signs/Symptoms: NPO  status Interventions: Ensure Enlive (each supplement provides 350kcal and 20 grams of protein), MVI, Tube feeding, Prostat  Obesity: Estimated body mass index is 31.18 kg/m as calculated from the following:   Height as of this encounter: 5\' 5"  (1.651 m).   Weight as of this encounter: 85 kg.   Code status:   Code Status: Full Code   DVT Prophylaxis: enoxaparin (LOVENOX) injection 40 mg Start: 04/15/23 1115 SCDs Start: 04/07/23 2045    Family Communication: None at bedside   Disposition Plan: Status is: Inpatient Remains inpatient appropriate because: Severity of illness   Planned Discharge Destination: Home with outpatient physical therapy.   Diet: Diet Order             Diet regular Room service appropriate? Yes with Assist; Fluid consistency: Thin  Diet effective now                     Antimicrobial agents: Anti-infectives (From admission, onward)    Start     Dose/Rate Route Frequency Ordered Stop   04/25/23 2200  rifaximin (XIFAXAN) tablet 550 mg        550 mg Oral 2 times daily 04/25/23 1436     04/20/23 1230  rifaximin (XIFAXAN) tablet 550 mg  Status:  Discontinued        550 mg Per Tube 2 times daily 04/20/23 1132 04/25/23 1436   04/11/23 1045  vancomycin (VANCOCIN) IVPB 1000 mg/200 mL premix        1,000 mg 200 mL/hr over 60 Minutes Intravenous  Once 04/11/23 0952 04/11/23 1211   04/09/23 0830  cefTRIAXone (ROCEPHIN) 1 g in sodium chloride 0.9 % 100 mL IVPB  Status:  Discontinued        1 g 200 mL/hr over 30 Minutes Intravenous Every 24 hours 04/08/23 0913 04/11/23 1133   04/08/23 1400  cefTRIAXone (ROCEPHIN) 2 g in sodium chloride 0.9 % 100 mL IVPB  Status:  Discontinued        2 g 200 mL/hr over 30 Minutes Intravenous Every 24 hours 04/07/23 2016 04/08/23 0742   04/08/23 0800  cefTRIAXone (ROCEPHIN) 2 g in sodium chloride 0.9 % 100 mL IVPB  Status:  Discontinued        2 g 200 mL/hr over 30 Minutes Intravenous Every 24 hours 04/08/23 0742  04/08/23 0913        MEDICATIONS: Scheduled Meds:  (feeding supplement) PROSource Plus  30 mL Oral BID BM   arformoterol  15 mcg Nebulization BID   Chlorhexidine Gluconate Cloth  6 each Topical Daily   enoxaparin (LOVENOX) injection  40 mg Subcutaneous Daily   folic acid  1 mg Oral Daily   furosemide  20 mg Oral Daily   gabapentin  300 mg Oral Daily   Gerhardt's butt cream   Topical BID   hydrocortisone  10 mg Oral Daily   hydrocortisone  5 mg Oral Daily   insulin aspart  0-9 Units Subcutaneous TID WC   lactulose  30 g Oral TID   midodrine  5 mg Oral Q8H   multivitamin with minerals  1 tablet Oral Daily   pantoprazole  40 mg Oral BID   potassium chloride  20 mEq Oral BID   QUEtiapine  75 mg Oral QHS   revefenacin  175 mcg Nebulization Daily   rifaximin  550 mg Oral BID   rosuvastatin  40 mg Oral Daily  spironolactone  12.5 mg Oral Daily   thiamine  100 mg Oral Daily   Continuous Infusions:  sodium chloride Stopped (04/17/23 0000)   PRN Meds:.sodium chloride, ALPRAZolam, docusate sodium, ipratropium-albuterol, oxyCODONE, polyethylene glycol, sodium chloride flush   I have personally reviewed following labs and imaging studies  LABORATORY DATA: CBC: Recent Labs  Lab 04/22/23 0442 04/23/23 0411 04/24/23 0642 04/26/23 0807  WBC 13.0* 10.6* 9.4 8.1  HGB 10.2* 10.1* 9.3* 9.3*  HCT 31.0* 31.8* 29.4* 28.9*  MCV 89.1 90.6 91.3 89.8  PLT 174 183 207 232     Basic Metabolic Panel: Recent Labs  Lab 04/22/23 0442 04/23/23 0411 04/24/23 0642 04/26/23 0807  NA 131* 131* 133* 134*  K 4.3 4.4 4.1 3.7  CL 107 107 109 106  CO2 17* 18* 18* 20*  GLUCOSE 191* 186* 210* 169*  BUN 13 13 10 8   CREATININE 0.76 0.67 0.67 0.61  CALCIUM 8.2* 8.1* 8.3* 8.5*  MG 2.2  --   --   --   PHOS 2.0*  --   --   --      GFR: Estimated Creatinine Clearance: 110.8 mL/min (by C-G formula based on SCr of 0.61 mg/dL).  Liver Function Tests: Recent Labs  Lab 04/26/23 0807  AST 44*   ALT 33  ALKPHOS 160*  BILITOT 1.5*  PROT 6.7  ALBUMIN 2.1*    No results for input(s): "LIPASE", "AMYLASE" in the last 168 hours.  Recent Labs  Lab 04/22/23 0442 04/23/23 0411 04/24/23 0642 04/26/23 0807  AMMONIA 74* 66* 42* 42*     Coagulation Profile: No results for input(s): "INR", "PROTIME" in the last 168 hours.  Cardiac Enzymes: No results for input(s): "CKTOTAL", "CKMB", "CKMBINDEX", "TROPONINI" in the last 168 hours.  BNP (last 3 results) No results for input(s): "PROBNP" in the last 8760 hours.  Lipid Profile: No results for input(s): "CHOL", "HDL", "LDLCALC", "TRIG", "CHOLHDL", "LDLDIRECT" in the last 72 hours.  Thyroid Function Tests: No results for input(s): "TSH", "T4TOTAL", "FREET4", "T3FREE", "THYROIDAB" in the last 72 hours.  Anemia Panel: No results for input(s): "VITAMINB12", "FOLATE", "FERRITIN", "TIBC", "IRON", "RETICCTPCT" in the last 72 hours.  Urine analysis:    Component Value Date/Time   COLORURINE YELLOW (A) 02/22/2023 1055   APPEARANCEUR CLEAR (A) 02/22/2023 1055   APPEARANCEUR Clear 03/16/2012 1315   LABSPEC 1.018 02/22/2023 1055   LABSPEC 1.013 03/16/2012 1315   PHURINE 5.0 02/22/2023 1055   GLUCOSEU NEGATIVE 02/22/2023 1055   GLUCOSEU 50 mg/dL 16/09/9603 5409   HGBUR NEGATIVE 02/22/2023 1055   BILIRUBINUR NEGATIVE 02/22/2023 1055   BILIRUBINUR Negative 03/16/2012 1315   KETONESUR 5 (A) 02/22/2023 1055   PROTEINUR NEGATIVE 02/22/2023 1055   NITRITE NEGATIVE 02/22/2023 1055   LEUKOCYTESUR NEGATIVE 02/22/2023 1055   LEUKOCYTESUR Negative 03/16/2012 1315    Sepsis Labs: Lactic Acid, Venous    Component Value Date/Time   LATICACIDVEN 2.3 (HH) 04/07/2023 2208    MICROBIOLOGY: No results found for this or any previous visit (from the past 240 hour(s)).   RADIOLOGY STUDIES/RESULTS: No results found.   LOS: 21 days   Jeoffrey Massed, MD  [facility]    To contact the attending provider between 7A-7P or the  covering provider during after hours 7P-7A, please log into the web site [email or web address] and access using universal [place] password for that web site. If you do not have the password, please call the hospital operator.  04/28/2023, 12:20 PM

## 2023-04-29 ENCOUNTER — Other Ambulatory Visit (HOSPITAL_COMMUNITY): Payer: Self-pay

## 2023-04-29 LAB — GLUCOSE, CAPILLARY: Glucose-Capillary: 105 mg/dL — ABNORMAL HIGH (ref 70–99)

## 2023-04-30 NOTE — Progress Notes (Unsigned)
This encounter was created in error - please disregard.

## 2023-05-01 ENCOUNTER — Other Ambulatory Visit: Payer: Self-pay | Admitting: Interventional Radiology

## 2023-05-01 DIAGNOSIS — K7030 Alcoholic cirrhosis of liver without ascites: Secondary | ICD-10-CM

## 2023-05-01 NOTE — Patient Outreach (Signed)
  Care Coordination    05/01/2023 Name: ADEMIDE MOREAU MRN: [id number] DOB: 02/04/1971  Regan Lemming is a 52 y.o. year old male who sees Medina-Vargas, Monina C, NP for primary care. I  collaborated with [facility] Liaison who indicates patient is high risk for readmission. Patient reported to colleague that his phone is not working at this time.  SW mailed the patient information about [facility] Care Management so that he may contact us if/when he would like assistance with care coordination services.  SDOH assessments and interventions completed:  No     Care Coordination Interventions:  Yes, provided   Interventions Today    Flowsheet Row Most Recent Value  Chronic Disease   Chronic disease during today's visit Hypertension (HTN), Congestive Heart Failure (CHF), Other  Education Interventions   Education Provided Provided Printed Education  [on [facility] Care Management]       Follow up plan: No further intervention required.   Encounter Outcome:  Pt. Visit Completed   Bevelyn Ngo, BSW, CDP Social Worker, Certified Dementia Practitioner [facility] Care Management  Care Coordination [phone]

## 2023-05-04 ENCOUNTER — Other Ambulatory Visit: Payer: Self-pay

## 2023-05-04 ENCOUNTER — Emergency Department: Payer: Medicare HMO

## 2023-05-04 DIAGNOSIS — E2740 Unspecified adrenocortical insufficiency: Secondary | ICD-10-CM | POA: Diagnosis not present

## 2023-05-04 DIAGNOSIS — I5032 Chronic diastolic (congestive) heart failure: Secondary | ICD-10-CM | POA: Diagnosis present

## 2023-05-04 DIAGNOSIS — Z8674 Personal history of sudden cardiac arrest: Secondary | ICD-10-CM

## 2023-05-04 DIAGNOSIS — I2510 Atherosclerotic heart disease of native coronary artery without angina pectoris: Secondary | ICD-10-CM | POA: Diagnosis present

## 2023-05-04 DIAGNOSIS — T82856A Stenosis of peripheral vascular stent, initial encounter: Secondary | ICD-10-CM | POA: Diagnosis present

## 2023-05-04 DIAGNOSIS — I255 Ischemic cardiomyopathy: Secondary | ICD-10-CM | POA: Diagnosis present

## 2023-05-04 DIAGNOSIS — F1010 Alcohol abuse, uncomplicated: Secondary | ICD-10-CM | POA: Diagnosis present

## 2023-05-04 DIAGNOSIS — G8929 Other chronic pain: Secondary | ICD-10-CM | POA: Diagnosis present

## 2023-05-04 DIAGNOSIS — R58 Hemorrhage, not elsewhere classified: Secondary | ICD-10-CM | POA: Diagnosis not present

## 2023-05-04 DIAGNOSIS — F32A Depression, unspecified: Secondary | ICD-10-CM | POA: Diagnosis present

## 2023-05-04 DIAGNOSIS — N390 Urinary tract infection, site not specified: Secondary | ICD-10-CM | POA: Diagnosis present

## 2023-05-04 DIAGNOSIS — K219 Gastro-esophageal reflux disease without esophagitis: Secondary | ICD-10-CM | POA: Diagnosis present

## 2023-05-04 DIAGNOSIS — F17210 Nicotine dependence, cigarettes, uncomplicated: Secondary | ICD-10-CM | POA: Diagnosis present

## 2023-05-04 DIAGNOSIS — D500 Iron deficiency anemia secondary to blood loss (chronic): Secondary | ICD-10-CM | POA: Diagnosis present

## 2023-05-04 DIAGNOSIS — I110 Hypertensive heart disease with heart failure: Secondary | ICD-10-CM | POA: Diagnosis present

## 2023-05-04 DIAGNOSIS — K8020 Calculus of gallbladder without cholecystitis without obstruction: Secondary | ICD-10-CM | POA: Diagnosis not present

## 2023-05-04 DIAGNOSIS — K7030 Alcoholic cirrhosis of liver without ascites: Secondary | ICD-10-CM | POA: Diagnosis present

## 2023-05-04 DIAGNOSIS — Z7952 Long term (current) use of systemic steroids: Secondary | ICD-10-CM

## 2023-05-04 DIAGNOSIS — F1290 Cannabis use, unspecified, uncomplicated: Secondary | ICD-10-CM | POA: Diagnosis not present

## 2023-05-04 DIAGNOSIS — K7040 Alcoholic hepatic failure without coma: Secondary | ICD-10-CM | POA: Diagnosis present

## 2023-05-04 DIAGNOSIS — K551 Chronic vascular disorders of intestine: Secondary | ICD-10-CM | POA: Diagnosis present

## 2023-05-04 DIAGNOSIS — I252 Old myocardial infarction: Secondary | ICD-10-CM

## 2023-05-04 DIAGNOSIS — R0789 Other chest pain: Secondary | ICD-10-CM | POA: Diagnosis not present

## 2023-05-04 DIAGNOSIS — R0689 Other abnormalities of breathing: Secondary | ICD-10-CM | POA: Diagnosis not present

## 2023-05-04 DIAGNOSIS — K55059 Acute (reversible) ischemia of intestine, part and extent unspecified: Principal | ICD-10-CM | POA: Diagnosis present

## 2023-05-04 DIAGNOSIS — M79605 Pain in left leg: Secondary | ICD-10-CM | POA: Diagnosis not present

## 2023-05-04 DIAGNOSIS — Z880 Allergy status to penicillin: Secondary | ICD-10-CM

## 2023-05-04 DIAGNOSIS — M545 Low back pain, unspecified: Secondary | ICD-10-CM | POA: Diagnosis not present

## 2023-05-04 DIAGNOSIS — K6389 Other specified diseases of intestine: Secondary | ICD-10-CM | POA: Diagnosis not present

## 2023-05-04 DIAGNOSIS — E785 Hyperlipidemia, unspecified: Secondary | ICD-10-CM | POA: Diagnosis present

## 2023-05-04 DIAGNOSIS — K811 Chronic cholecystitis: Secondary | ICD-10-CM | POA: Diagnosis not present

## 2023-05-04 DIAGNOSIS — F1410 Cocaine abuse, uncomplicated: Secondary | ICD-10-CM | POA: Diagnosis present

## 2023-05-04 DIAGNOSIS — K922 Gastrointestinal hemorrhage, unspecified: Secondary | ICD-10-CM | POA: Diagnosis not present

## 2023-05-04 DIAGNOSIS — T82858A Stenosis of vascular prosthetic devices, implants and grafts, initial encounter: Secondary | ICD-10-CM | POA: Diagnosis not present

## 2023-05-04 DIAGNOSIS — Z9889 Other specified postprocedural states: Secondary | ICD-10-CM | POA: Diagnosis not present

## 2023-05-04 DIAGNOSIS — K766 Portal hypertension: Secondary | ICD-10-CM | POA: Diagnosis present

## 2023-05-04 DIAGNOSIS — M549 Dorsalgia, unspecified: Secondary | ICD-10-CM | POA: Diagnosis not present

## 2023-05-04 DIAGNOSIS — R5381 Other malaise: Secondary | ICD-10-CM | POA: Diagnosis present

## 2023-05-04 DIAGNOSIS — R079 Chest pain, unspecified: Secondary | ICD-10-CM | POA: Diagnosis present

## 2023-05-04 DIAGNOSIS — E876 Hypokalemia: Secondary | ICD-10-CM | POA: Diagnosis not present

## 2023-05-04 DIAGNOSIS — K567 Ileus, unspecified: Secondary | ICD-10-CM | POA: Diagnosis present

## 2023-05-04 DIAGNOSIS — F1090 Alcohol use, unspecified, uncomplicated: Secondary | ICD-10-CM | POA: Diagnosis not present

## 2023-05-04 DIAGNOSIS — Z79899 Other long term (current) drug therapy: Secondary | ICD-10-CM | POA: Diagnosis not present

## 2023-05-04 DIAGNOSIS — I700 Atherosclerosis of aorta: Secondary | ICD-10-CM | POA: Diagnosis present

## 2023-05-04 DIAGNOSIS — K7460 Unspecified cirrhosis of liver: Secondary | ICD-10-CM | POA: Diagnosis not present

## 2023-05-04 DIAGNOSIS — N3000 Acute cystitis without hematuria: Secondary | ICD-10-CM | POA: Diagnosis not present

## 2023-05-04 DIAGNOSIS — F1210 Cannabis abuse, uncomplicated: Secondary | ICD-10-CM | POA: Diagnosis present

## 2023-05-04 DIAGNOSIS — E271 Primary adrenocortical insufficiency: Secondary | ICD-10-CM | POA: Diagnosis present

## 2023-05-04 DIAGNOSIS — F1910 Other psychoactive substance abuse, uncomplicated: Secondary | ICD-10-CM | POA: Diagnosis not present

## 2023-05-04 DIAGNOSIS — Z811 Family history of alcohol abuse and dependence: Secondary | ICD-10-CM

## 2023-05-04 DIAGNOSIS — M5126 Other intervertebral disc displacement, lumbar region: Secondary | ICD-10-CM | POA: Diagnosis not present

## 2023-05-04 DIAGNOSIS — Z8249 Family history of ischemic heart disease and other diseases of the circulatory system: Secondary | ICD-10-CM

## 2023-05-04 DIAGNOSIS — K649 Unspecified hemorrhoids: Secondary | ICD-10-CM | POA: Diagnosis present

## 2023-05-04 DIAGNOSIS — M79604 Pain in right leg: Secondary | ICD-10-CM | POA: Diagnosis not present

## 2023-05-04 DIAGNOSIS — I708 Atherosclerosis of other arteries: Secondary | ICD-10-CM | POA: Diagnosis not present

## 2023-05-04 DIAGNOSIS — K810 Acute cholecystitis: Secondary | ICD-10-CM | POA: Diagnosis not present

## 2023-05-04 DIAGNOSIS — K3189 Other diseases of stomach and duodenum: Secondary | ICD-10-CM | POA: Diagnosis present

## 2023-05-04 DIAGNOSIS — F419 Anxiety disorder, unspecified: Secondary | ICD-10-CM | POA: Diagnosis present

## 2023-05-04 DIAGNOSIS — R4182 Altered mental status, unspecified: Secondary | ICD-10-CM | POA: Diagnosis not present

## 2023-05-04 DIAGNOSIS — K625 Hemorrhage of anus and rectum: Secondary | ICD-10-CM | POA: Diagnosis not present

## 2023-05-04 DIAGNOSIS — R269 Unspecified abnormalities of gait and mobility: Secondary | ICD-10-CM | POA: Diagnosis present

## 2023-05-04 DIAGNOSIS — Y838 Other surgical procedures as the cause of abnormal reaction of the patient, or of later complication, without mention of misadventure at the time of the procedure: Secondary | ICD-10-CM | POA: Diagnosis present

## 2023-05-04 DIAGNOSIS — Z5320 Procedure and treatment not carried out because of patient's decision for unspecified reasons: Secondary | ICD-10-CM | POA: Diagnosis not present

## 2023-05-04 DIAGNOSIS — Z720 Tobacco use: Secondary | ICD-10-CM | POA: Diagnosis not present

## 2023-05-04 DIAGNOSIS — K828 Other specified diseases of gallbladder: Secondary | ICD-10-CM | POA: Diagnosis not present

## 2023-05-04 DIAGNOSIS — R918 Other nonspecific abnormal finding of lung field: Secondary | ICD-10-CM | POA: Diagnosis not present

## 2023-05-04 DIAGNOSIS — R1084 Generalized abdominal pain: Secondary | ICD-10-CM | POA: Diagnosis not present

## 2023-05-04 DIAGNOSIS — Z955 Presence of coronary angioplasty implant and graft: Secondary | ICD-10-CM

## 2023-05-04 DIAGNOSIS — J439 Emphysema, unspecified: Secondary | ICD-10-CM | POA: Diagnosis not present

## 2023-05-04 DIAGNOSIS — Z7902 Long term (current) use of antithrombotics/antiplatelets: Secondary | ICD-10-CM

## 2023-05-04 LAB — CBC
HCT: 30.8 % — ABNORMAL LOW (ref 39.0–52.0)
Hemoglobin: 10 g/dL — ABNORMAL LOW (ref 13.0–17.0)
MCH: 28.7 pg (ref 26.0–34.0)
MCHC: 32.5 g/dL (ref 30.0–36.0)
MCV: 88.5 fL (ref 80.0–100.0)
Platelets: 236 10*3/uL (ref 150–400)
RBC: 3.48 MIL/uL — ABNORMAL LOW (ref 4.22–5.81)
RDW: 17.9 % — ABNORMAL HIGH (ref 11.5–15.5)
WBC: 8.3 10*3/uL (ref 4.0–10.5)
nRBC: 0 % (ref 0.0–0.2)

## 2023-05-04 LAB — COMPREHENSIVE METABOLIC PANEL
ALT: 24 U/L (ref 0–44)
AST: 51 U/L — ABNORMAL HIGH (ref 15–41)
Albumin: 2.9 g/dL — ABNORMAL LOW (ref 3.5–5.0)
Alkaline Phosphatase: 161 U/L — ABNORMAL HIGH (ref 38–126)
Anion gap: 12 (ref 5–15)
BUN: <5 mg/dL — ABNORMAL LOW (ref 6–20)
CO2: 23 mmol/L (ref 22–32)
Calcium: 8.8 mg/dL — ABNORMAL LOW (ref 8.9–10.3)
Chloride: 99 mmol/L (ref 98–111)
Creatinine, Ser: 0.8 mg/dL (ref 0.61–1.24)
GFR, Estimated: >60 mL/min (ref >60)
Glucose, Bld: 115 mg/dL — ABNORMAL HIGH (ref 70–99)
Potassium: 2.7 mmol/L — CL (ref 3.5–5.1)
Sodium: 134 mmol/L — ABNORMAL LOW (ref 135–145)
Total Bilirubin: 3.6 mg/dL — ABNORMAL HIGH (ref 0.3–1.2)
Total Protein: 7.8 g/dL (ref 6.5–8.1)

## 2023-05-04 LAB — LIPASE, BLOOD: Lipase: 31 U/L (ref 11–51)

## 2023-05-04 LAB — TROPONIN I (HIGH SENSITIVITY): Troponin I (High Sensitivity): 18 ng/L — ABNORMAL HIGH (ref <18)

## 2023-05-04 NOTE — ED Triage Notes (Signed)
Pt to ED via [facility] c/o central chest pain, lower abd pain, and lower back pain. States this all started Tuesday afternoon after he got home from hospital. Reports being constipated, nauseous and vomiting.

## 2023-05-04 NOTE — ED Triage Notes (Signed)
FIRST NURSE NOTE:  Pt arrived via [facility] was picked up from a car in a driveway, pt was recently released from [facility] x 1 month had TIPS procedure while in the  hospital, pt c/o chest pain, abdominal pain, SOB, pt also c/o rectal bleeding.   P 98 EKG =NSR 98% 117/64 ET-22 98.4  CBG 119  22G Hand

## 2023-05-05 ENCOUNTER — Emergency Department: Payer: Medicare HMO

## 2023-05-05 ENCOUNTER — Inpatient Hospital Stay
Admission: EM | Admit: 2023-05-05 | Discharge: 2023-05-13 | DRG: 357 | Disposition: A | Discharge status: Home / Self Care | Payer: Medicare HMO | Attending: Obstetrics and Gynecology | Admitting: Obstetrics and Gynecology

## 2023-05-05 DIAGNOSIS — F1090 Alcohol use, unspecified, uncomplicated: Secondary | ICD-10-CM

## 2023-05-05 DIAGNOSIS — F1290 Cannabis use, unspecified, uncomplicated: Secondary | ICD-10-CM | POA: Diagnosis not present

## 2023-05-05 DIAGNOSIS — D500 Iron deficiency anemia secondary to blood loss (chronic): Secondary | ICD-10-CM | POA: Diagnosis present

## 2023-05-05 DIAGNOSIS — K810 Acute cholecystitis: Secondary | ICD-10-CM | POA: Diagnosis not present

## 2023-05-05 DIAGNOSIS — I2510 Atherosclerotic heart disease of native coronary artery without angina pectoris: Secondary | ICD-10-CM | POA: Diagnosis present

## 2023-05-05 DIAGNOSIS — I708 Atherosclerosis of other arteries: Secondary | ICD-10-CM | POA: Diagnosis not present

## 2023-05-05 DIAGNOSIS — M549 Dorsalgia, unspecified: Secondary | ICD-10-CM | POA: Diagnosis not present

## 2023-05-05 DIAGNOSIS — K7460 Unspecified cirrhosis of liver: Secondary | ICD-10-CM | POA: Diagnosis not present

## 2023-05-05 DIAGNOSIS — R1084 Generalized abdominal pain: Secondary | ICD-10-CM

## 2023-05-05 DIAGNOSIS — Z79899 Other long term (current) drug therapy: Secondary | ICD-10-CM | POA: Diagnosis not present

## 2023-05-05 DIAGNOSIS — E785 Hyperlipidemia, unspecified: Secondary | ICD-10-CM | POA: Diagnosis present

## 2023-05-05 DIAGNOSIS — F1910 Other psychoactive substance abuse, uncomplicated: Secondary | ICD-10-CM | POA: Diagnosis present

## 2023-05-05 DIAGNOSIS — M79604 Pain in right leg: Secondary | ICD-10-CM

## 2023-05-05 DIAGNOSIS — K7030 Alcoholic cirrhosis of liver without ascites: Secondary | ICD-10-CM | POA: Diagnosis present

## 2023-05-05 DIAGNOSIS — M79605 Pain in left leg: Secondary | ICD-10-CM

## 2023-05-05 DIAGNOSIS — K551 Chronic vascular disorders of intestine: Secondary | ICD-10-CM | POA: Diagnosis present

## 2023-05-05 DIAGNOSIS — E876 Hypokalemia: Principal | ICD-10-CM | POA: Diagnosis present

## 2023-05-05 DIAGNOSIS — N390 Urinary tract infection, site not specified: Secondary | ICD-10-CM | POA: Diagnosis present

## 2023-05-05 DIAGNOSIS — K625 Hemorrhage of anus and rectum: Secondary | ICD-10-CM

## 2023-05-05 DIAGNOSIS — N3000 Acute cystitis without hematuria: Secondary | ICD-10-CM | POA: Diagnosis not present

## 2023-05-05 DIAGNOSIS — I10 Essential (primary) hypertension: Secondary | ICD-10-CM | POA: Diagnosis present

## 2023-05-05 DIAGNOSIS — I25119 Atherosclerotic heart disease of native coronary artery with unspecified angina pectoris: Secondary | ICD-10-CM | POA: Diagnosis present

## 2023-05-05 DIAGNOSIS — E271 Primary adrenocortical insufficiency: Secondary | ICD-10-CM | POA: Diagnosis present

## 2023-05-05 DIAGNOSIS — K766 Portal hypertension: Secondary | ICD-10-CM | POA: Diagnosis present

## 2023-05-05 DIAGNOSIS — F1010 Alcohol abuse, uncomplicated: Secondary | ICD-10-CM | POA: Diagnosis present

## 2023-05-05 DIAGNOSIS — Z720 Tobacco use: Secondary | ICD-10-CM | POA: Diagnosis not present

## 2023-05-05 DIAGNOSIS — K55059 Acute (reversible) ischemia of intestine, part and extent unspecified: Secondary | ICD-10-CM | POA: Diagnosis present

## 2023-05-05 DIAGNOSIS — F418 Other specified anxiety disorders: Secondary | ICD-10-CM | POA: Diagnosis present

## 2023-05-05 DIAGNOSIS — I5032 Chronic diastolic (congestive) heart failure: Secondary | ICD-10-CM | POA: Diagnosis present

## 2023-05-05 DIAGNOSIS — G629 Polyneuropathy, unspecified: Secondary | ICD-10-CM

## 2023-05-05 DIAGNOSIS — R079 Chest pain, unspecified: Secondary | ICD-10-CM | POA: Diagnosis present

## 2023-05-05 DIAGNOSIS — I110 Hypertensive heart disease with heart failure: Secondary | ICD-10-CM | POA: Diagnosis present

## 2023-05-05 DIAGNOSIS — T82858A Stenosis of vascular prosthetic devices, implants and grafts, initial encounter: Secondary | ICD-10-CM

## 2023-05-05 DIAGNOSIS — F1410 Cocaine abuse, uncomplicated: Secondary | ICD-10-CM | POA: Diagnosis present

## 2023-05-05 DIAGNOSIS — K567 Ileus, unspecified: Secondary | ICD-10-CM | POA: Diagnosis present

## 2023-05-05 DIAGNOSIS — Y838 Other surgical procedures as the cause of abnormal reaction of the patient, or of later complication, without mention of misadventure at the time of the procedure: Secondary | ICD-10-CM | POA: Diagnosis present

## 2023-05-05 DIAGNOSIS — T82856A Stenosis of peripheral vascular stent, initial encounter: Secondary | ICD-10-CM | POA: Diagnosis present

## 2023-05-05 DIAGNOSIS — E2740 Unspecified adrenocortical insufficiency: Secondary | ICD-10-CM | POA: Diagnosis not present

## 2023-05-05 DIAGNOSIS — R109 Unspecified abdominal pain: Secondary | ICD-10-CM | POA: Diagnosis present

## 2023-05-05 DIAGNOSIS — K922 Gastrointestinal hemorrhage, unspecified: Secondary | ICD-10-CM | POA: Diagnosis present

## 2023-05-05 DIAGNOSIS — Z9889 Other specified postprocedural states: Secondary | ICD-10-CM | POA: Diagnosis not present

## 2023-05-05 DIAGNOSIS — K7040 Alcoholic hepatic failure without coma: Secondary | ICD-10-CM | POA: Diagnosis present

## 2023-05-05 DIAGNOSIS — F32A Depression, unspecified: Secondary | ICD-10-CM | POA: Diagnosis present

## 2023-05-05 DIAGNOSIS — G8929 Other chronic pain: Secondary | ICD-10-CM | POA: Diagnosis present

## 2023-05-05 DIAGNOSIS — F17210 Nicotine dependence, cigarettes, uncomplicated: Secondary | ICD-10-CM

## 2023-05-05 DIAGNOSIS — K811 Chronic cholecystitis: Secondary | ICD-10-CM | POA: Diagnosis not present

## 2023-05-05 DIAGNOSIS — F419 Anxiety disorder, unspecified: Secondary | ICD-10-CM | POA: Diagnosis present

## 2023-05-05 DIAGNOSIS — I255 Ischemic cardiomyopathy: Secondary | ICD-10-CM | POA: Diagnosis present

## 2023-05-05 LAB — URINALYSIS, ROUTINE W REFLEX MICROSCOPIC
Bacteria, UA: NONE SEEN
Bilirubin Urine: NEGATIVE
Glucose, UA: NEGATIVE mg/dL
Ketones, ur: NEGATIVE mg/dL
Nitrite: POSITIVE — AB
Protein, ur: NEGATIVE mg/dL
Specific Gravity, Urine: 1.027 (ref 1.005–1.030)
Squamous Epithelial / HPF: NONE SEEN /HPF (ref 0–5)
WBC, UA: >50 WBC/hpf (ref 0–5)
pH: 8 (ref 5.0–8.0)

## 2023-05-05 LAB — URINALYSIS, W/ REFLEX TO CULTURE (INFECTION SUSPECTED)
Bilirubin Urine: NEGATIVE
Glucose, UA: NEGATIVE mg/dL
Ketones, ur: NEGATIVE mg/dL
Nitrite: POSITIVE — AB
Protein, ur: NEGATIVE mg/dL
Specific Gravity, Urine: 1.028 (ref 1.005–1.030)
Squamous Epithelial / HPF: NONE SEEN /HPF (ref 0–5)
WBC, UA: >50 WBC/hpf (ref 0–5)
pH: 8 (ref 5.0–8.0)

## 2023-05-05 LAB — URINE DRUG SCREEN, QUALITATIVE (ARMC ONLY)
Amphetamines, Ur Screen: NOT DETECTED
Barbiturates, Ur Screen: NOT DETECTED
Benzodiazepine, Ur Scrn: POSITIVE — AB
Cannabinoid 50 Ng, Ur ~~LOC~~: NOT DETECTED
Cocaine Metabolite,Ur ~~LOC~~: POSITIVE — AB
MDMA (Ecstasy)Ur Screen: NOT DETECTED
Methadone Scn, Ur: NOT DETECTED
Opiate, Ur Screen: POSITIVE — AB
Phencyclidine (PCP) Ur S: NOT DETECTED
Tricyclic, Ur Screen: NOT DETECTED

## 2023-05-05 LAB — CBC
HCT: 30 % — ABNORMAL LOW (ref 39.0–52.0)
HCT: 28.5 % — ABNORMAL LOW (ref 39.0–52.0)
HCT: 30.5 % — ABNORMAL LOW (ref 39.0–52.0)
Hemoglobin: 9.7 g/dL — ABNORMAL LOW (ref 13.0–17.0)
Hemoglobin: 9.2 g/dL — ABNORMAL LOW (ref 13.0–17.0)
Hemoglobin: 9.6 g/dL — ABNORMAL LOW (ref 13.0–17.0)
MCH: 28.9 pg (ref 26.0–34.0)
MCH: 28.5 pg (ref 26.0–34.0)
MCH: 28.3 pg (ref 26.0–34.0)
MCHC: 32.3 g/dL (ref 30.0–36.0)
MCHC: 32.3 g/dL (ref 30.0–36.0)
MCHC: 31.5 g/dL (ref 30.0–36.0)
MCV: 89.3 fL (ref 80.0–100.0)
MCV: 88.2 fL (ref 80.0–100.0)
MCV: 90 fL (ref 80.0–100.0)
Platelets: 180 10*3/uL (ref 150–400)
Platelets: 181 10*3/uL (ref 150–400)
Platelets: 203 10*3/uL (ref 150–400)
RBC: 3.36 MIL/uL — ABNORMAL LOW (ref 4.22–5.81)
RBC: 3.23 MIL/uL — ABNORMAL LOW (ref 4.22–5.81)
RBC: 3.39 MIL/uL — ABNORMAL LOW (ref 4.22–5.81)
RDW: 18.1 % — ABNORMAL HIGH (ref 11.5–15.5)
RDW: 18.2 % — ABNORMAL HIGH (ref 11.5–15.5)
RDW: 18.3 % — ABNORMAL HIGH (ref 11.5–15.5)
WBC: 5.3 10*3/uL (ref 4.0–10.5)
WBC: 5.6 10*3/uL (ref 4.0–10.5)
WBC: 5.5 10*3/uL (ref 4.0–10.5)
nRBC: 0 % (ref 0.0–0.2)
nRBC: 0 % (ref 0.0–0.2)
nRBC: 0 % (ref 0.0–0.2)

## 2023-05-05 LAB — BRAIN NATRIURETIC PEPTIDE: B Natriuretic Peptide: 426.3 pg/mL — ABNORMAL HIGH (ref 0.0–100.0)

## 2023-05-05 LAB — HEMOGLOBIN A1C
Hgb A1c MFr Bld: 5.2 % (ref 4.8–5.6)
Mean Plasma Glucose: 102.54 mg/dL

## 2023-05-05 LAB — TROPONIN I (HIGH SENSITIVITY)
Troponin I (High Sensitivity): 23 ng/L — ABNORMAL HIGH (ref <18)
Troponin I (High Sensitivity): 17 ng/L (ref <18)

## 2023-05-05 LAB — LIPID PANEL
Cholesterol: 135 mg/dL (ref 0–200)
HDL: 15 mg/dL — ABNORMAL LOW (ref >40)
LDL Cholesterol: 100 mg/dL — ABNORMAL HIGH (ref 0–99)
Total CHOL/HDL Ratio: 9 RATIO
Triglycerides: 98 mg/dL (ref <150)
VLDL: 20 mg/dL (ref 0–40)

## 2023-05-05 LAB — PROTIME-INR
INR: 1.5 — ABNORMAL HIGH (ref 0.8–1.2)
Prothrombin Time: 18 seconds — ABNORMAL HIGH (ref 11.4–15.2)

## 2023-05-05 LAB — URINE CULTURE

## 2023-05-05 LAB — PHOSPHORUS: Phosphorus: 2.6 mg/dL (ref 2.5–4.6)

## 2023-05-05 LAB — ETHANOL: Alcohol, Ethyl (B): <10 mg/dL (ref <10)

## 2023-05-05 LAB — APTT: aPTT: 37 seconds — ABNORMAL HIGH (ref 24–36)

## 2023-05-05 LAB — TYPE AND SCREEN
ABO/RH(D): A POS
Antibody Screen: NEGATIVE

## 2023-05-05 LAB — MAGNESIUM: Magnesium: 1.5 mg/dL — ABNORMAL LOW (ref 1.7–2.4)

## 2023-05-05 LAB — POTASSIUM: Potassium: 4.4 mmol/L (ref 3.5–5.1)

## 2023-05-05 LAB — AMMONIA: Ammonia: 59 umol/L — ABNORMAL HIGH (ref 9–35)

## 2023-05-05 LAB — LACTIC ACID, PLASMA: Lactic Acid, Venous: 1.2 mmol/L (ref 0.5–1.9)

## 2023-05-05 MED ORDER — LORAZEPAM 2 MG/ML IJ SOLN: 0.0000 mg | Freq: Four times a day (QID) | INTRAMUSCULAR | Status: AC

## 2023-05-05 MED ORDER — MORPHINE SULFATE (PF) 4 MG/ML IV SOLN
4.0000 mg | Freq: Once | INTRAVENOUS | Status: AC
  Administered 2023-05-05: 4 mg via INTRAVENOUS
  Filled 2023-05-05: qty 1

## 2023-05-05 MED ORDER — THIAMINE MONONITRATE 100 MG PO TABS
100.0000 mg | ORAL_TABLET | Freq: Every day | ORAL | Status: DC
  Filled 2023-05-05: qty 1

## 2023-05-05 MED ORDER — GABAPENTIN 300 MG PO CAPS
300.0000 mg | ORAL_CAPSULE | Freq: Two times a day (BID) | ORAL | Status: DC
  Administered 2023-05-05 – 2023-05-07 (×4): 300 mg via ORAL
  Filled 2023-05-05 (×4): qty 1

## 2023-05-05 MED ORDER — SODIUM CHLORIDE 0.9 % IV SOLN: INTRAVENOUS | Status: DC

## 2023-05-05 MED ORDER — LORAZEPAM 1 MG PO TABS: 1.0000 mg | ORAL_TABLET | ORAL | Status: AC | PRN

## 2023-05-05 MED ORDER — POTASSIUM CHLORIDE 10 MEQ/100ML IV SOLN
10.0000 meq | Freq: Once | INTRAVENOUS | Status: AC
  Administered 2023-05-05: 10 meq via INTRAVENOUS
  Filled 2023-05-05: qty 100

## 2023-05-05 MED ORDER — ACETAMINOPHEN 325 MG PO TABS
650.0000 mg | ORAL_TABLET | Freq: Four times a day (QID) | ORAL | Status: DC | PRN
  Administered 2023-05-07 – 2023-05-08 (×3): 650 mg via ORAL
  Filled 2023-05-05 (×4): qty 2

## 2023-05-05 MED ORDER — HYDROCORTISONE 10 MG PO TABS
10.0000 mg | ORAL_TABLET | Freq: Every day | ORAL | Status: DC
  Administered 2023-05-05 – 2023-05-13 (×8): 10 mg via ORAL
  Filled 2023-05-05 (×9): qty 1

## 2023-05-05 MED ORDER — FOLIC ACID 1 MG PO TABS
1.0000 mg | ORAL_TABLET | Freq: Every day | ORAL | Status: DC
  Administered 2023-05-05 – 2023-05-13 (×8): 1 mg via ORAL
  Filled 2023-05-05 (×9): qty 1

## 2023-05-05 MED ORDER — THIAMINE HCL 100 MG/ML IJ SOLN
100.0000 mg | Freq: Every day | INTRAMUSCULAR | Status: DC
  Filled 2023-05-05 (×2): qty 2

## 2023-05-05 MED ORDER — POTASSIUM CHLORIDE CRYS ER 20 MEQ PO TBCR
40.0000 meq | EXTENDED_RELEASE_TABLET | Freq: Once | ORAL | Status: AC
  Administered 2023-05-05: 40 meq via ORAL
  Filled 2023-05-05: qty 2

## 2023-05-05 MED ORDER — QUETIAPINE FUMARATE 25 MG PO TABS
50.0000 mg | ORAL_TABLET | Freq: Every day | ORAL | Status: DC
  Administered 2023-05-05 – 2023-05-12 (×8): 50 mg via ORAL
  Filled 2023-05-05 (×8): qty 2

## 2023-05-05 MED ORDER — OXYCODONE HCL 5 MG PO TABS
5.0000 mg | ORAL_TABLET | Freq: Four times a day (QID) | ORAL | Status: DC | PRN
  Administered 2023-05-05 – 2023-05-07 (×5): 5 mg via ORAL
  Filled 2023-05-05 (×5): qty 1

## 2023-05-05 MED ORDER — THIAMINE MONONITRATE 100 MG PO TABS
100.0000 mg | ORAL_TABLET | Freq: Every day | ORAL | Status: DC
  Administered 2023-05-05 – 2023-05-13 (×8): 100 mg via ORAL
  Filled 2023-05-05 (×8): qty 1

## 2023-05-05 MED ORDER — PANTOPRAZOLE SODIUM 40 MG IV SOLR
40.0000 mg | Freq: Two times a day (BID) | INTRAVENOUS | Status: DC
  Administered 2023-05-05 – 2023-05-06 (×3): 40 mg via INTRAVENOUS
  Filled 2023-05-05 (×3): qty 10

## 2023-05-05 MED ORDER — LORAZEPAM 2 MG/ML IJ SOLN
0.0000 mg | Freq: Two times a day (BID) | INTRAMUSCULAR | Status: AC
  Administered 2023-05-07: 2 mg via INTRAVENOUS
  Administered 2023-05-08: 1 mg via INTRAVENOUS
  Filled 2023-05-05 (×2): qty 1

## 2023-05-05 MED ORDER — SODIUM CHLORIDE 0.9 % IV BOLUS
500.0000 mL | Freq: Once | INTRAVENOUS | Status: AC
  Administered 2023-05-05: 500 mL via INTRAVENOUS

## 2023-05-05 MED ORDER — ADULT MULTIVITAMIN W/MINERALS CH
1.0000 | ORAL_TABLET | Freq: Every day | ORAL | Status: DC
  Administered 2023-05-05 – 2023-05-13 (×8): 1 via ORAL
  Filled 2023-05-05 (×8): qty 1

## 2023-05-05 MED ORDER — DIPHENHYDRAMINE HCL 50 MG/ML IJ SOLN
12.5000 mg | Freq: Three times a day (TID) | INTRAMUSCULAR | Status: DC | PRN
  Administered 2023-05-09 – 2023-05-11 (×3): 12.5 mg via INTRAVENOUS
  Filled 2023-05-05 (×3): qty 1

## 2023-05-05 MED ORDER — LACTULOSE 10 GM/15ML PO SOLN
20.0000 g | Freq: Three times a day (TID) | ORAL | Status: DC
  Administered 2023-05-05 – 2023-05-06 (×5): 20 g via ORAL
  Filled 2023-05-05 (×6): qty 30

## 2023-05-05 MED ORDER — LORAZEPAM 2 MG/ML IJ SOLN
1.0000 mg | INTRAMUSCULAR | Status: AC | PRN
  Administered 2023-05-08: 2 mg via INTRAVENOUS
  Filled 2023-05-05: qty 1

## 2023-05-05 MED ORDER — LORAZEPAM 2 MG/ML IJ SOLN
1.0000 mg | Freq: Once | INTRAMUSCULAR | Status: AC
  Administered 2023-05-05: 1 mg via INTRAVENOUS
  Filled 2023-05-05: qty 1

## 2023-05-05 MED ORDER — SODIUM CHLORIDE 0.9 % IV SOLN
50.0000 ug/h | INTRAVENOUS | Status: DC
  Administered 2023-05-05: 50 ug/h via INTRAVENOUS
  Filled 2023-05-05 (×2): qty 1

## 2023-05-05 MED ORDER — HYDROCORTISONE ACETATE 25 MG RE SUPP: 25.0000 mg | Freq: Two times a day (BID) | RECTAL | Status: DC | PRN

## 2023-05-05 MED ORDER — NICOTINE 21 MG/24HR TD PT24
21.0000 mg | MEDICATED_PATCH | Freq: Every day | TRANSDERMAL | Status: DC
  Administered 2023-05-05 – 2023-05-13 (×8): 21 mg via TRANSDERMAL
  Filled 2023-05-05 (×8): qty 1

## 2023-05-05 MED ORDER — IOHEXOL 300 MG/ML  SOLN
100.0000 mL | Freq: Once | INTRAMUSCULAR | Status: AC | PRN
  Administered 2023-05-05: 100 mL via INTRAVENOUS

## 2023-05-05 MED ORDER — MIDODRINE HCL 5 MG PO TABS
10.0000 mg | ORAL_TABLET | Freq: Three times a day (TID) | ORAL | Status: DC
  Administered 2023-05-05 – 2023-05-13 (×23): 10 mg via ORAL
  Filled 2023-05-05 (×23): qty 2

## 2023-05-05 MED ORDER — MORPHINE SULFATE (PF) 2 MG/ML IV SOLN
2.0000 mg | INTRAVENOUS | Status: DC | PRN
  Administered 2023-05-05 – 2023-05-06 (×5): 2 mg via INTRAVENOUS
  Filled 2023-05-05 (×5): qty 1

## 2023-05-05 MED ORDER — ROSUVASTATIN CALCIUM 10 MG PO TABS
40.0000 mg | ORAL_TABLET | Freq: Every day | ORAL | Status: DC
  Administered 2023-05-05 – 2023-05-12 (×8): 40 mg via ORAL
  Filled 2023-05-05 (×7): qty 4
  Filled 2023-05-05: qty 2

## 2023-05-05 MED ORDER — SODIUM CHLORIDE 0.9 % IV SOLN
1.0000 g | INTRAVENOUS | Status: DC
  Administered 2023-05-05 – 2023-05-07 (×3): 1 g via INTRAVENOUS
  Filled 2023-05-05 (×3): qty 10

## 2023-05-05 MED ORDER — MAGNESIUM SULFATE 4 GM/100ML IV SOLN
4.0000 g | Freq: Once | INTRAVENOUS | Status: AC
  Administered 2023-05-05: 4 g via INTRAVENOUS
  Filled 2023-05-05: qty 100

## 2023-05-05 MED ORDER — RIFAXIMIN 550 MG PO TABS
550.0000 mg | ORAL_TABLET | Freq: Two times a day (BID) | ORAL | Status: DC
  Administered 2023-05-05 – 2023-05-13 (×15): 550 mg via ORAL
  Filled 2023-05-05 (×17): qty 1

## 2023-05-05 MED ORDER — LORAZEPAM 2 MG/ML IJ SOLN
0.0000 mg | Freq: Four times a day (QID) | INTRAMUSCULAR | Status: DC
  Filled 2023-05-05: qty 1

## 2023-05-05 NOTE — ED Notes (Signed)
Lab notified of troponin lab draw. They will be down as soon as possible

## 2023-05-05 NOTE — Consult Note (Signed)
Arlyss Repress, MD [address]  [address]  [place], [place] [address]  Main: [phone]  Fax: [phone] Pager: [phone]   Consultation  Referring Provider:     No ref. provider found Primary Care Physician:  [name], NP Primary Gastroenterologist:  Dr. Lannette Donath         Reason for Consultation: Anemia, cirrhosis of liver Date of Admission:  05/05/2023 Date of Consultation:  05/05/2023         HPI:   Ethan Wells is a 53 y.o. male history of alcoholic cirrhosis of liver, esophageal varices, status post EVL, TIPS, SMA stenosis, and multiple other comorbidities, recently hospitalized from 4/22 - 5/13 due to upper GI bleeding. Pt is s/p EGD with banding on 4/23 and s/p of TIPS procedure on 4/26.  Patient presented with rectal pain, rectal bleeding.  His hemoglobin overall has been stable within the last 10 days.  He had brown bowel movement in the ER which is loose, as patient is taking lactulose.  He is concerned about rectal pain.  He is also complaining of low back pain, chest pain, abdominal pain.  Imaging revealed gallbladder sludge, cirrhosis, portal hypertension, TIPS patent, 50 to 75% narrowing of proximal celiac axis.   NSAIDs: None  Antiplts/Anticoagulants/Anti thrombotics: None  GI Procedures: Reviewed  Past Medical History:  Diagnosis Date   CAD (coronary artery disease)    a. 01/2011 Anterior STEMI/Cath/PCI: LM nl, LAD 100d (3.5x65mm Vision BMS placed), LCX 11m, RI 50, RCA min irregs, EF 40% w/ apical, inferoapical HK; b. 10/2022 NSTSEMI/Cath: LM nl, LAD 5p/m, RI 85, LCX 137m, OM1 mild dzs, OM3 100 fills via L->L collats from dLAD, RCA 80p, 19m, RPDA fills via collats from LAD. EF 45-50%-->Med Rx.   Cardiac arrest - ventricular fibrillation    a. In setting of STEMI 01/2011   Chronic pain    Cocaine abuse (HCC)    Depression with anxiety    ETOH abuse    a. 6-12 beers/day   GERD (gastroesophageal reflux disease)    GI bleed     Hemorrhoids    Hyperlipidemia    Hypertension    Ischemic cardiomyopathy    a. 06/2011 Echo: EF 45-50%, No rwma; b. 10/2022 Echo: EF 55-60%, no rwma, nl RV fxn.   Marijuana abuse    Migraines    Tobacco abuse    a. 1/2 ppd x 26 yrs    Past Surgical History:  Procedure Laterality Date   COLONOSCOPY  08/09/2021   Procedure: COLONOSCOPY;  Surgeon: Midge Minium, MD;  Location: [facility] ENDOSCOPY;  Service: Endoscopy;;   CORONARY ANGIOPLASTY WITH STENT PLACEMENT     ESOPHAGEAL BANDING  04/08/2023   Procedure: ESOPHAGEAL BANDING;  Surgeon: Tressia Danas, MD;  Location: [facility] ENDOSCOPY;  Service: Gastroenterology;;   ESOPHAGOGASTRODUODENOSCOPY N/A 05/30/2022   Procedure: ESOPHAGOGASTRODUODENOSCOPY (EGD);  Surgeon: Toney Reil, MD;  Location: [facility] ENDOSCOPY;  Service: Gastroenterology;  Laterality: N/A;   ESOPHAGOGASTRODUODENOSCOPY (EGD) WITH PROPOFOL N/A 03/01/2021   Procedure: ESOPHAGOGASTRODUODENOSCOPY (EGD) WITH PROPOFOL;  Surgeon: Toney Reil, MD;  Location: [facility]GERY [facility];  Service: Endoscopy;  Laterality: N/A;   ESOPHAGOGASTRODUODENOSCOPY (EGD) WITH PROPOFOL N/A 08/09/2021   Procedure: ESOPHAGOGASTRODUODENOSCOPY (EGD) WITH PROPOFOL;  Surgeon: Midge Minium, MD;  Location: [facility] ENDOSCOPY;  Service: Endoscopy;  Laterality: N/A;   ESOPHAGOGASTRODUODENOSCOPY (EGD) WITH PROPOFOL N/A 03/01/2023   Procedure: ESOPHAGOGASTRODUODENOSCOPY (EGD) WITH PROPOFOL;  Surgeon: Jaynie Collins, DO;  Location: [facility] ENDOSCOPY;  Service: Gastroenterology;  Laterality: N/A;  ESOPHAGOGASTRODUODENOSCOPY (EGD) WITH PROPOFOL N/A 04/08/2023   Procedure: ESOPHAGOGASTRODUODENOSCOPY (EGD) WITH PROPOFOL;  Surgeon: Tressia Danas, MD;  Location: [facility] ENDOSCOPY;  Service: Gastroenterology;  Laterality: N/A;   EVALUATION UNDER ANESTHESIA WITH HEMORRHOIDECTOMY N/A 10/31/2022   Procedure: EXAM UNDER ANESTHESIA WITH SUTURE LIGATION OF TWO BLEEDING PEDICLES;  Surgeon: Henrene Dodge, MD;  Location: [facility] ORS;   Service: General;  Laterality: N/A;   FLEXIBLE SIGMOIDOSCOPY N/A 10/02/2022   Procedure: FLEXIBLE SIGMOIDOSCOPY;  Surgeon: Meryl Dare, MD;  Location: [facility] ENDOSCOPY;  Service: Gastroenterology;  Laterality: N/A;   IR TIPS  04/11/2023   IR US GUIDE VASC ACCESS RIGHT  04/11/2023   IR US GUIDE VASC ACCESS RIGHT  04/11/2023   IR US GUIDE VASC ACCESS RIGHT  04/11/2023   LEFT HEART CATH AND CORONARY ANGIOGRAPHY N/A 11/04/2022   Procedure: LEFT HEART CATH AND CORONARY ANGIOGRAPHY;  Surgeon: Iran Ouch, MD;  Location: [facility] INVASIVE CV LAB;  Service: Cardiovascular;  Laterality: N/A;   RADIOLOGY WITH ANESTHESIA N/A 04/11/2023   Procedure: IR WITH ANESTHESIA;  Surgeon: Oley Balm, MD;  Location: [facility] OR;  Service: Radiology;  Laterality: N/A;   VISCERAL ANGIOGRAPHY N/A 10/24/2022   Procedure: VISCERAL ANGIOGRAPHY;  Surgeon: Annice Needy, MD;  Location: [facility] INVASIVE CV LAB;  Service: Cardiovascular;  Laterality: N/A;     Current Facility-Administered Medications:    0.9 %  sodium chloride infusion, , Intravenous, Continuous, Lorretta Harp, MD, Last Rate: 50 mL/hr at 05/05/23 1209, New Bag at 05/05/23 1209   acetaminophen (TYLENOL) tablet 650 mg, 650 mg, Oral, Q6H PRN, Lorretta Harp, MD   cefTRIAXone (ROCEPHIN) 1 g in sodium chloride 0.9 % 100 mL IVPB, 1 g, Intravenous, Q24H, Lorretta Harp, MD, Stopped at 05/05/23 1225   diphenhydrAMINE (BENADRYL) injection 12.5 mg, 12.5 mg, Intravenous, Q8H PRN, Lorretta Harp, MD   folic acid (FOLVITE) tablet 1 mg, 1 mg, Oral, Daily, Lorretta Harp, MD, 1 mg at 05/05/23 1613   gabapentin (NEURONTIN) capsule 300 mg, 300 mg, Oral, BID, Lorretta Harp, MD   hydrocortisone (CORTEF) tablet 10 mg, 10 mg, Oral, Daily, Lorretta Harp, MD, 10 mg at 05/05/23 1610   lactulose (CHRONULAC) 10 GM/15ML solution 20 g, 20 g, Oral, TID, Lorretta Harp, MD, 20 g at 05/05/23 0959   LORazepam (ATIVAN) injection 0-4 mg, 0-4 mg, Intravenous, Q6H **FOLLOWED BY** [START ON 05/07/2023] LORazepam (ATIVAN) injection  0-4 mg, 0-4 mg, Intravenous, Q12H, Lorretta Harp, MD   LORazepam (ATIVAN) tablet 1-4 mg, 1-4 mg, Oral, Q1H PRN **OR** LORazepam (ATIVAN) injection 1-4 mg, 1-4 mg, Intravenous, Q1H PRN, Lorretta Harp, MD   midodrine (PROAMATINE) tablet 10 mg, 10 mg, Oral, TID WC, Lorretta Harp, MD, 10 mg at 05/05/23 1611   morphine (PF) 2 MG/ML injection 2 mg, 2 mg, Intravenous, Q4H PRN, Mansy, Jan A, MD, 2 mg at 05/05/23 1611   multivitamin with minerals tablet 1 tablet, 1 tablet, Oral, Daily, Lorretta Harp, MD, 1 tablet at 05/05/23 0957   nicotine (NICODERM CQ - dosed in mg/24 hours) patch 21 mg, 21 mg, Transdermal, Daily, Lorretta Harp, MD, 21 mg at 05/05/23 1213   oxyCODONE (Oxy IR/ROXICODONE) immediate release tablet 5 mg, 5 mg, Oral, Q6H PRN, Lorretta Harp, MD, 5 mg at 05/05/23 1122   pantoprazole (PROTONIX) injection 40 mg, 40 mg, Intravenous, Q12H, Lorretta Harp, MD, 40 mg at 05/05/23 1212   QUEtiapine (SEROQUEL) tablet 50 mg, 50 mg, Oral, QHS, Lorretta Harp, MD   rifaximin [employer]) tablet 550 mg, 550 mg, Oral, BID, Lorretta Harp, MD  rosuvastatin (CRESTOR) tablet 40 mg, 40 mg, Oral, QHS, Lorretta Harp, MD   thiamine (VITAMIN B1) tablet 100 mg, 100 mg, Oral, Daily, 100 mg at 05/05/23 0957 **OR** thiamine (VITAMIN B1) injection 100 mg, 100 mg, Intravenous, Daily, Lorretta Harp, MD  Current Outpatient Medications:    folic acid (FOLVITE) 1 MG tablet, Take 1 tablet (1 mg total) by mouth daily., Disp: 30 tablet, Rfl: 1   furosemide (LASIX) 20 MG tablet, Take 1 tablet (20 mg total) by mouth daily., Disp: 30 tablet, Rfl: 1   gabapentin (NEURONTIN) 300 MG capsule, Take 1 capsule (300 mg total) by mouth 2 (two) times daily., Disp: 60 capsule, Rfl: 1   hydrocortisone (CORTEF) 10 MG tablet, Take 1 tablet (10 mg total) by mouth daily., Disp: 30 tablet, Rfl: 1   hydrocortisone (CORTEF) 10 MG tablet, Take 1/2 tablet (5 mg total) by mouth daily after lunch., Disp: 15 tablet, Rfl: 0   lactulose (CHRONULAC) 10 GM/15ML solution, Take 30 mLs (20 g total)  by mouth 3 (three) times daily. Titrate to 1-2 BM per day, Disp: 2700 mL, Rfl: 1   midodrine (PROAMATINE) 5 MG tablet, Take 1 tablet (5 mg total) by mouth 3 (three) times daily. Skip the dose if Systolic BP greater than 110 mmHg and or HR greater than 100, Disp: 90 tablet, Rfl: 2   Multiple Vitamin (MULTIVITAMIN WITH MINERALS) TABS tablet, Take 1 tablet by mouth daily., Disp: 30 tablet, Rfl: 1   nitroGLYCERIN (NITROSTAT) 0.4 MG SL tablet, Place 1 tablet (0.4 mg total) under the tongue every 5 (five) minutes as needed for chest pain., Disp: 30 tablet, Rfl: 0   pantoprazole (PROTONIX) 40 MG tablet, Take 1 tablet (40 mg total) by mouth 2 (two) times daily., Disp: 60 tablet, Rfl: 1   potassium chloride SA (KLOR-CON M) 20 MEQ tablet, Take 1 tablet (20 mEq total) by mouth daily., Disp: 30 tablet, Rfl: 0   QUEtiapine (SEROQUEL) 50 MG tablet, Take 1 tablet (50 mg total) by mouth at bedtime., Disp: 30 tablet, Rfl: 1   rifaximin (XIFAXAN) 550 MG TABS tablet, Take 1 tablet (550 mg total) by mouth 2 (two) times daily., Disp: 60 tablet, Rfl: 1   rosuvastatin (CRESTOR) 40 MG tablet, Take 1 tablet (40 mg total) by mouth daily., Disp: 30 tablet, Rfl: 1   spironolactone (ALDACTONE) 25 MG tablet, Take 0.5 tablets (12.5 mg total) by mouth daily., Disp: 30 tablet, Rfl: 1   thiamine (VITAMIN B1) 100 MG tablet, Take 1 tablet (100 mg total) by mouth daily., Disp: 30 tablet, Rfl: 1   Multiple Vitamin (MULTIVITAMIN WITH MINERALS) TABS tablet, Take 1 tablet by mouth daily., Disp: 30 tablet, Rfl: 1   Family History  Problem Relation Age of Onset   Coronary artery disease Mother        alive   Other Other        2 sisters alive and well   Alcohol abuse Father        died @ 67   Other Paternal Grandmother        PACER     Social History   Tobacco Use   Smoking status: Every Day    Packs/day: 0.50    Years: 41.00    Additional pack years: 0.00    Total pack years: 20.50    Types: Cigarettes   Smokeless tobacco:  Never   Tobacco comments:    started age 67  Vaping Use   Vaping Use: Never used  Substance  Use Topics   Alcohol use: Yes    Alcohol/week: 12.0 standard drinks of alcohol    Types: 12 Cans of beer per week    Comment: at least a 6 pack of beer daily and often a 12 pack   Drug use: Yes    Types: Cocaine, Marijuana    Allergies as of 05/04/2023 - Review Complete 05/04/2023  Allergen Reaction Noted   Penicillins Hives 02/26/2011    Review of Systems:    All systems reviewed and negative except where noted in HPI.   Physical Exam:  Vital signs in last 24 hours: Temp:  [98 F (36.7 C)-98.2 F (36.8 C)] 98.2 F (36.8 C) (05/20 1500) Pulse Rate:  [82-102] 82 (05/20 1500) Resp:  [16-22] 18 (05/20 1500) BP: (97-121)/(49-97) 103/57 (05/20 1500) SpO2:  [92 %-100 %] 92 % (05/20 1500) Weight:  [79.4 kg] 79.4 kg (05/19 1946)   General:   Pleasant, cooperative in NAD Head:  Normocephalic and atraumatic. Eyes:   No icterus.   Conjunctiva pink. PERRLA. Ears:  Normal auditory acuity. Neck:  Supple; no masses or thyroidomegaly Lungs: Respirations even and unlabored. Lungs clear to auscultation bilaterally.   No wheezes, crackles, or rhonchi.  Heart:  Regular rate and rhythm;  Without murmur, clicks, rubs or gallops Abdomen:  Soft, nondistended, nontender. Normal bowel sounds. No appreciable masses or hepatomegaly.  No rebound or guarding.  Rectal: Liquid brown stool in the rectal vault, mildly tender digital rectal exam Msk:  Symmetrical without gross deformities.  Strength generalized weakness Extremities:  Without edema, cyanosis or clubbing. Neurologic:  Alert and oriented x3;  grossly normal neurologically. Skin:  Intact without significant lesions or rashes. Psych:  Alert and cooperative. Normal affect.  LAB RESULTS:    Latest Ref Rng & Units 05/05/2023    1:13 PM 05/05/2023    9:56 AM 05/04/2023    7:47 PM  CBC  WBC 4.0 - 10.5 K/uL 5.6  5.3  8.3   Hemoglobin 13.0 - 17.0 g/dL  9.2  9.7  16.1   Hematocrit 39.0 - 52.0 % 28.5  30.0  30.8   Platelets 150 - 400 K/uL 181  180  236     BMET    Latest Ref Rng & Units 05/04/2023    7:47 PM 04/26/2023    8:07 AM 04/24/2023    6:42 AM  BMP  Glucose 70 - 99 mg/dL 096  045  409   BUN 6 - 20 mg/dL 5  8  10    Creatinine 0.61 - 1.24 mg/dL 8.11  9.14  7.82   Sodium 135 - 145 mmol/L 134  134  133   Potassium 3.5 - 5.1 mmol/L 2.7  3.7  4.1   Chloride 98 - 111 mmol/L 99  106  109   CO2 22 - 32 mmol/L 23  20  18    Calcium 8.9 - 10.3 mg/dL 8.8  8.5  8.3     LFT    Latest Ref Rng & Units 05/04/2023    7:47 PM 04/26/2023    8:07 AM 04/17/2023    5:02 AM  Hepatic Function  Total Protein 6.5 - 8.1 g/dL 7.8  6.7  5.8   Albumin 3.5 - 5.0 g/dL 2.9  2.1  2.2   AST 15 - 41 U/L 51  44  69   ALT 0 - 44 U/L 24  33  44   Alk Phosphatase 38 - 126 U/L 161  160  181   Total  Bilirubin 0.3 - 1.2 mg/dL 3.6  1.5  2.6      STUDIES: US ABDOMEN LIMITED RUQ (LIVER/GB)  Result Date: 05/05/2023 CLINICAL DATA:  Right upper quadrant abdominal pain EXAM: ULTRASOUND ABDOMEN LIMITED RIGHT UPPER QUADRANT COMPARISON:  None Available. FINDINGS: Gallbladder: The gallbladder is mildly distended and a small amount of layering sludge is seen within the gallbladder. However, there is no gallbladder wall thickening and no pericholecystic fluid is identified. The sonographic Eulah Pont sign is reportedly NEGATIVE. Common bile duct: Diameter: 4 mm in proximal diameter Liver: Subtle contour nodularity of the liver is seen in keeping with cirrhosis. Tips shunt is partially visualized within the right hepatic lobe. No focal intrahepatic mass is identified. No intrahepatic biliary ductal dilation. Portal vein is patent on color Doppler imaging with normal direction of blood flow towards the liver. There is antegrade flow within the proximal portion of the shunt and retrograde flow within the left portal vein, expected following shunt placement. Other: No ascites IMPRESSION:  1. Gallbladder sludge without sonographic evidence of acute cholecystitis. 2. Hepatic cirrhosis. 3. Patent portal vein with normal direction of blood flow. There is retrograde flow within the left portal vein, expected following shunt placement. Electronically Signed   By: Helyn Numbers M.D.   On: 05/05/2023 04:03   CT ABDOMEN PELVIS W CONTRAST  Result Date: 05/05/2023 CLINICAL DATA:  Abdominal pain, acute, nonlocalized EXAM: CT ABDOMEN AND PELVIS WITH CONTRAST TECHNIQUE: Multidetector CT imaging of the abdomen and pelvis was performed using the standard protocol following bolus administration of intravenous contrast. RADIATION DOSE REDUCTION: This exam was performed according to the departmental dose-optimization program which includes automated exposure control, adjustment of the mA and/or kV according to patient size and/or use of iterative reconstruction technique. CONTRAST:  OMNIPAQUE IOHEXOL 300 MG/ML  SOLN COMPARISON:  09/30/2022 FINDINGS: Lower chest: No acute abnormality. Stable cardiomegaly. There is circumferential thickening of the distal esophagus which appears stable since prior examination, poss fleck ting changes of esophagitis. Hepatobiliary: Right portal vein-hepatic vein tips shunt is in place extending from the portal bifurcation to the hepatocaval junction. The shunt is patent. Mild contour nodularity of the liver is seen in keeping with changes of cirrhosis. No enhancing intrahepatic mass. The gallbladder is distended and there is trace pericholecystic inflammatory stranding which may reflect changes of acute cholecystitis. No intra or extrahepatic biliary ductal dilation. Pancreas: Unremarkable Spleen: Stable mild splenomegaly with the spleen measuring 13.9 cm in greatest dimension. No intrasplenic lesions are seen. Splenic vein is patent. Adrenals/Urinary Tract: Adrenal glands are unremarkable. Kidneys are normal, without renal calculi, focal lesion, or hydronephrosis. Bladder is  unremarkable. Stomach/Bowel: The stomach, small bowel, and large bowel are unremarkable. The appendix is not clearly identified and is likely absent. No free intraperitoneal gas or fluid. Vascular/Lymphatic: Superior mesenteric artery stenting has been performed with the luminal diameter of the stented segment measuring 2 mm in diameter with small amount of neointimal hyperplasia noted. The vessel distally remains patent. Celiac axis demonstrates a 50-75% narrowing proximally secondary to mass effect from the median arcuate ligament. Inferior mesenteric artery is widely patent. Mild aortoiliac atherosclerotic calcification. No aortic aneurysm. Reproductive: Prostate is unremarkable. Other: No abdominal wall hernia Musculoskeletal: No acute bone abnormality. IMPRESSION: 1. Distended gallbladder with trace pericholecystic inflammatory stranding which may reflect changes of acute cholecystitis. Correlation with clinical examination and liver enzymes is recommended. If indicated, this would be better assessed with dedicated right upper quadrant sonography. 2. Morphologic changes in keeping with cirrhosis and portal venous  hypertension with mild splenomegaly. Patent TIPS shunt. 3. Interval superior mesenteric artery stenting with luminal diameter of 2 mm with small amount of neointimal hyperplasia noted. The vessel distally remains patent. Given findings within the celiac axis below, clinical correlation for signs and symptoms of chronic mesenteric ischemia is recommended. Endovascular revision of the stented segment may be warranted. 4. 50-75% narrowing of the proximal celiac axis secondary to mass effect from the median arcuate ligament. 5. Circumferential thickening of the distal esophagus, stable since prior examination, possibly reflecting changes of esophagitis. 6. Stable cardiomegaly. Aortic Atherosclerosis (ICD10-I70.0). Electronically Signed   By: Helyn Numbers M.D.   On: 05/05/2023 02:42   CT Head Wo  Contrast  Result Date: 05/05/2023 CLINICAL DATA:  Altered mental status. EXAM: CT HEAD WITHOUT CONTRAST TECHNIQUE: Contiguous axial images were obtained from the base of the skull through the vertex without intravenous contrast. RADIATION DOSE REDUCTION: This exam was performed according to the departmental dose-optimization program which includes automated exposure control, adjustment of the mA and/or kV according to patient size and/or use of iterative reconstruction technique. COMPARISON:  Head CT dated 09/30/2022. FINDINGS: Brain: Mild age-related atrophy and chronic microvascular ischemic changes. There is no acute intracranial hemorrhage. No mass effect or midline shift. No extra-axial fluid collection. Vascular: No hyperdense vessel or unexpected calcification. Skull: Normal. Negative for fracture or focal lesion. Sinuses/Orbits: No acute finding. Other: None IMPRESSION: 1. No acute intracranial pathology. 2. Mild age-related atrophy and chronic microvascular ischemic changes. Electronically Signed   By: Elgie Collard M.D.   On: 05/05/2023 02:28   DG Chest 2 View  Result Date: 05/04/2023 CLINICAL DATA:  Chest pain. EXAM: CHEST - 2 VIEW COMPARISON:  Chest radiograph dated 04/16/2023. FINDINGS: No focal consolidation, pleural effusion, or pneumothorax. Mild cardiomegaly. No acute osseous pathology. IMPRESSION: 1. No active cardiopulmonary disease. 2. Mild cardiomegaly. Electronically Signed   By: Elgie Collard M.D.   On: 05/04/2023 23:41      Impression / Plan:   Ethan Wells is a 52 y.o. male with decompensated alcohol cirrhosis of liver, esophageal varices, s/p TIPS in 03/2023 presented with low back pain, abdominal pain, chest pain, rectal bleeding  Rectal bleeding Hemoglobin has been stable, hemodynamically insignificant bleed Digital rectal exam revealed liquid brown bowel movement Most likely secondary to hemorrhoids Recommend Anusol suppository 1-2 times daily as needed Do  not recommend any endoscopic evaluation at this time Okay to discontinue ceftriaxone and octreotide  Thank you for involving me in the care of this patient.      LOS: 0 days   Lannette Donath, MD  05/05/2023, 3:50 PM    Note: This dictation was prepared with Dragon dictation along with smaller phrase technology. Any transcriptional errors that result from this process are unintentional.

## 2023-05-05 NOTE — ED Notes (Signed)
Patient awakens easily to verbal stimuli. Reports pain down to 8/10. Continent of 300 ml urine in urinal. No distress noted at this time.

## 2023-05-05 NOTE — ED Notes (Signed)
Patient AOX4 with spontaneous regular respirations. Reports pain to abdomen and chest has consistently been 10/10 since getting onto ambulance. Morphine administered for pain. NS infusing at 50 ml/hr. Venipuncture performed to right hand for repeat CBC and potassium level. NSR on monitor at 70's. Bed in lowest position with call light in reach and Side rails up. Denies additional needs at this time.

## 2023-05-05 NOTE — ED Provider Notes (Signed)
[facility] Provider Note    Event Date/Time   First MD Initiated Contact with Patient 05/05/23 0026     (approximate)   History   Chest Pain and Abdominal Pain   HPI  Ethan Wells is a 52 y.o. male brought to the ED via EMS from home with multiple medical complaints.  Patient complains of central chest pain, generalized abdominal pain, lower back pain, dizziness, syncope, constipation, nausea/vomiting, rectal bleeding.  Recent hospitalization at [facility] for TIPS procedure.  Denies hematemesis.  Admits to drinking beer since discharged from the hospital 5 days ago.  History of cocaine use, patient is vague regarding his last use.  Denies fever/chills, headache, vision changes, shortness of breath, dysuria or diarrhea.     Past Medical History   Past Medical History:  Diagnosis Date   CAD (coronary artery disease)    a. 01/2011 Anterior STEMI/Cath/PCI: LM nl, LAD 100d (3.5x68mm Vision BMS placed), LCX 76m, RI 50, RCA min irregs, EF 40% w/ apical, inferoapical HK; b. 10/2022 NSTSEMI/Cath: LM nl, LAD 5p/m, RI 85, LCX 174m, OM1 mild dzs, OM3 100 fills via L->L collats from dLAD, RCA 80p, 134m, RPDA fills via collats from LAD. EF 45-50%-->Med Rx.   Cardiac arrest - ventricular fibrillation    a. In setting of STEMI 01/2011   Chronic pain    Cocaine abuse (HCC)    Depression with anxiety    ETOH abuse    a. 6-12 beers/day   GERD (gastroesophageal reflux disease)    GI bleed    Hemorrhoids    Hyperlipidemia    Hypertension    Ischemic cardiomyopathy    a. 06/2011 Echo: EF 45-50%, No rwma; b. 10/2022 Echo: EF 55-60%, no rwma, nl RV fxn.   Marijuana abuse    Migraines    Tobacco abuse    a. 1/2 ppd x 26 yrs     Active Problem List   Patient Active Problem List   Diagnosis Date Noted   Alcoholic cirrhosis (HCC) 04/12/2023   Esophageal varices with bleeding in diseases classified elsewhere (HCC) 04/09/2023   Hepatic encephalopathy (HCC)  04/09/2023   Secondary esophageal varices with bleeding (HCC) 04/08/2023   Upper GI bleeding 04/07/2023   Leg edema 03/01/2023   GI bleeding 02/22/2023   HLD (hyperlipidemia) 02/22/2023   Thrombocytopenia (HCC) 02/22/2023   Adrenal insufficiency (HCC) 02/22/2023   Alcoholic liver disease (HCC) 02/22/2023   Upper GI bleed 02/22/2023   Myocardial injury 01/29/2023   Adrenal insufficiency (Addison's disease) (HCC) 01/28/2023   Reactive thrombocytosis 01/24/2023   Hypophosphatemia 01/22/2023   Acute hypoxic respiratory failure (HCC) 01/20/2023   Orthostatic hypotension 01/17/2023   Anasarca 01/17/2023   Obesity (BMI 30-39.9) 01/17/2023   Cocaine dependence with cocaine-induced disorder (HCC) 01/17/2023   Rectal bleeding 01/17/2023   Peripheral edema 01/17/2023   History of benzodiazepine use 01/17/2023   Chest pain 11/19/2022   Acute on chronic heart failure with preserved ejection fraction (HFpEF) (HCC) 10/29/2022   Acute respiratory distress 10/29/2022   Elevated rheumatoid factor 10/28/2022   Hyperkalemia 10/25/2022   Hemorrhoids, internal, with bleeding 10/25/2022   Mesenteric artery stenosis (HCC) 10/23/2022   Iron deficiency anemia 10/23/2022   Hematochezia    Abnormal CT scan, colon    Colitis 09/30/2022   Prolonged QT interval 09/30/2022   Alcohol use disorder 08/19/2022   GI bleed 05/28/2022   Non-intractable vomiting    Epigastric pain    Polyp of ascending colon    Anemia  due to GI blood loss 08/07/2021   Acute gastritis    Hyperbilirubinemia    Elevated troponin    Notalgia    History of ST elevation myocardial infarction (STEMI) 12/19/2020   Alcohol dependence (HCC) 12/19/2020   Depression with anxiety 10/05/2020   Esophageal reflux 10/05/2020   Headache 10/05/2020   Hyperlipidemia 10/05/2020   Hyponatremia 06/04/2020   Hypomagnesemia 06/04/2020   Chronic systolic CHF (congestive heart failure) (HCC) 06/04/2020   Leukocytosis 06/04/2020   Abdominal pain  06/04/2020   Hypokalemia 06/04/2020   Ischemic cardiomyopathy    Polysubstance abuse (HCC)    NSTEMI (non-ST elevated myocardial infarction) (HCC) 07/12/2019   Abdominal pain, RUQ 05/25/2018   Chest pain, mid sternal 11/17/2012   Cocaine abuse (HCC)    Marijuana abuse    Tobacco abuse 02/26/2011   Essential hypertension 02/26/2011   CAD (coronary artery disease) 02/26/2011   Other specified forms of chronic ischemic heart disease 02/26/2011     Past Surgical History   Past Surgical History:  Procedure Laterality Date   COLONOSCOPY  08/09/2021   Procedure: COLONOSCOPY;  Surgeon: Midge Minium, MD;  Location: [facility] ENDOSCOPY;  Service: Endoscopy;;   CORONARY ANGIOPLASTY WITH STENT PLACEMENT     ESOPHAGEAL BANDING  04/08/2023   Procedure: ESOPHAGEAL BANDING;  Surgeon: Tressia Danas, MD;  Location: [facility] ENDOSCOPY;  Service: Gastroenterology;;   ESOPHAGOGASTRODUODENOSCOPY N/A 05/30/2022   Procedure: ESOPHAGOGASTRODUODENOSCOPY (EGD);  Surgeon: Toney Reil, MD;  Location: [facility] ENDOSCOPY;  Service: Gastroenterology;  Laterality: N/A;   ESOPHAGOGASTRODUODENOSCOPY (EGD) WITH PROPOFOL N/A 03/01/2021   Procedure: ESOPHAGOGASTRODUODENOSCOPY (EGD) WITH PROPOFOL;  Surgeon: Toney Reil, MD;  Location: [facility];  Service: Endoscopy;  Laterality: N/A;   ESOPHAGOGASTRODUODENOSCOPY (EGD) WITH PROPOFOL N/A 08/09/2021   Procedure: ESOPHAGOGASTRODUODENOSCOPY (EGD) WITH PROPOFOL;  Surgeon: Midge Minium, MD;  Location: [facility] ENDOSCOPY;  Service: Endoscopy;  Laterality: N/A;   ESOPHAGOGASTRODUODENOSCOPY (EGD) WITH PROPOFOL N/A 03/01/2023   Procedure: ESOPHAGOGASTRODUODENOSCOPY (EGD) WITH PROPOFOL;  Surgeon: Jaynie Collins, DO;  Location: [facility] ENDOSCOPY;  Service: Gastroenterology;  Laterality: N/A;   ESOPHAGOGASTRODUODENOSCOPY (EGD) WITH PROPOFOL N/A 04/08/2023   Procedure: ESOPHAGOGASTRODUODENOSCOPY (EGD) WITH PROPOFOL;  Surgeon: Tressia Danas, MD;  Location: [facility] ENDOSCOPY;   Service: Gastroenterology;  Laterality: N/A;   EVALUATION UNDER ANESTHESIA WITH HEMORRHOIDECTOMY N/A 10/31/2022   Procedure: EXAM UNDER ANESTHESIA WITH SUTURE LIGATION OF TWO BLEEDING PEDICLES;  Surgeon: Henrene Dodge, MD;  Location: [facility] ORS;  Service: General;  Laterality: N/A;   FLEXIBLE SIGMOIDOSCOPY N/A 10/02/2022   Procedure: FLEXIBLE SIGMOIDOSCOPY;  Surgeon: Meryl Dare, MD;  Location: [facility] ENDOSCOPY;  Service: Gastroenterology;  Laterality: N/A;   IR TIPS  04/11/2023   IR US GUIDE VASC ACCESS RIGHT  04/11/2023   IR US GUIDE VASC ACCESS RIGHT  04/11/2023   IR US GUIDE VASC ACCESS RIGHT  04/11/2023   LEFT HEART CATH AND CORONARY ANGIOGRAPHY N/A 11/04/2022   Procedure: LEFT HEART CATH AND CORONARY ANGIOGRAPHY;  Surgeon: Iran Ouch, MD;  Location: [facility] INVASIVE CV LAB;  Service: Cardiovascular;  Laterality: N/A;   RADIOLOGY WITH ANESTHESIA N/A 04/11/2023   Procedure: IR WITH ANESTHESIA;  Surgeon: Oley Balm, MD;  Location: [facility] OR;  Service: Radiology;  Laterality: N/A;   VISCERAL ANGIOGRAPHY N/A 10/24/2022   Procedure: VISCERAL ANGIOGRAPHY;  Surgeon: Annice Needy, MD;  Location: [facility] INVASIVE CV LAB;  Service: Cardiovascular;  Laterality: N/A;     Home Medications   Prior to Admission medications   Medication Sig Start Date End Date Taking? Authorizing Provider  folic acid (FOLVITE) 1 MG tablet Take 1 tablet (1 mg total) by mouth daily. 04/29/23   Ghimire, Werner Lean, MD  furosemide (LASIX) 20 MG tablet Take 1 tablet (20 mg total) by mouth daily. 04/29/23   Ghimire, Werner Lean, MD  gabapentin (NEURONTIN) 300 MG capsule Take 1 capsule (300 mg total) by mouth 2 (two) times daily. 04/28/23 06/27/23  Ghimire, Werner Lean, MD  hydrocortisone (CORTEF) 10 MG tablet Take 1 tablet (10 mg total) by mouth daily. 04/28/23 06/27/23  Ghimire, Werner Lean, MD  hydrocortisone (CORTEF) 10 MG tablet Take 1/2 tablet (5 mg total) by mouth daily after lunch. 04/28/23 04/27/24  Ghimire, Werner Lean, MD  lactulose  (CHRONULAC) 10 GM/15ML solution Take 30 mLs (20 g total) by mouth 3 (three) times daily. Titrate to 1-2 BM per day 04/28/23 06/27/23  Maretta Bees, MD  midodrine (PROAMATINE) 5 MG tablet Take 1 tablet (5 mg total) by mouth 3 (three) times daily. Skip the dose if Systolic BP greater than 110 mmHg and or HR greater than 100 04/28/23 07/27/23  Ghimire, Werner Lean, MD  Multiple Vitamin (MULTIVITAMIN WITH MINERALS) TABS tablet Take 1 tablet by mouth daily. 04/28/23 06/27/23  Ghimire, Werner Lean, MD  Multiple Vitamin (MULTIVITAMIN WITH MINERALS) TABS tablet Take 1 tablet by mouth daily. 04/29/23   Ghimire, Werner Lean, MD  nitroGLYCERIN (NITROSTAT) 0.4 MG SL tablet Place 1 tablet (0.4 mg total) under the tongue every 5 (five) minutes as needed for chest pain. 04/28/23   Ghimire, Werner Lean, MD  pantoprazole (PROTONIX) 40 MG tablet Take 1 tablet (40 mg total) by mouth 2 (two) times daily. 04/28/23 06/27/23  Ghimire, Werner Lean, MD  potassium chloride SA (KLOR-CON M) 20 MEQ tablet Take 1 tablet (20 mEq total) by mouth daily. 04/28/23   Ghimire, Werner Lean, MD  QUEtiapine (SEROQUEL) 50 MG tablet Take 1 tablet (50 mg total) by mouth at bedtime. 04/28/23 06/27/23  Ghimire, Werner Lean, MD  rifaximin (XIFAXAN) 550 MG TABS tablet Take 1 tablet (550 mg total) by mouth 2 (two) times daily. 04/28/23   Ghimire, Werner Lean, MD  rosuvastatin (CRESTOR) 40 MG tablet Take 1 tablet (40 mg total) by mouth daily. 04/28/23 04/27/24  Ghimire, Werner Lean, MD  spironolactone (ALDACTONE) 25 MG tablet Take 0.5 tablets (12.5 mg total) by mouth daily. 04/28/23 07/27/23  Ghimire, Werner Lean, MD  thiamine (VITAMIN B1) 100 MG tablet Take 1 tablet (100 mg total) by mouth daily. 04/29/23   Ghimire, Werner Lean, MD     Allergies  Penicillins   Family History   Family History  Problem Relation Age of Onset   Coronary artery disease Mother        alive   Other Other        2 sisters alive and well   Alcohol abuse Father        died @ 75   Other Paternal  Grandmother        PACER     Physical Exam  Triage Vital Signs: ED Triage Vitals  Enc Vitals Group     BP 05/04/23 1946 (!) 109/54     Pulse Rate 05/04/23 1946 (!) 101     Resp 05/04/23 1946 (!) 22     Temp 05/04/23 1946 98 F (36.7 C)     Temp Source 05/04/23 1946 Oral     SpO2 05/04/23 1946 100 %     Weight 05/04/23 1946 175 lb (79.4 kg)     Height 05/04/23 1946  5\' 5"  (1.651 m)     Head Circumference --      Peak Flow --      Pain Score 05/04/23 1958 10     Pain Loc --      Pain Edu? --      Excl. in GC? --     Updated Vital Signs: BP (!) 110/51   Pulse 93   Temp 98 F (36.7 C) (Oral)   Resp 16   Ht 5\' 5"  (1.651 m)   Wt 79.4 kg   SpO2 99%   BMI 29.12 kg/m    General: Awake, mild distress.  Disheveled. CV:  RRR.  Good peripheral perfusion.  Resp:  Increased effort.  CTAB. Abd:  Nontender.  No distention.  Other:  No truncal vesicles.   ED Results / Procedures / Treatments  Labs (all labs ordered are listed, but only abnormal results are displayed) Labs Reviewed  COMPREHENSIVE METABOLIC PANEL - Abnormal; Notable for the following components:      Result Value   Sodium 134 (*)    Potassium 2.7 (*)    Glucose, Bld 115 (*)    BUN <5 (*)    Calcium 8.8 (*)    Albumin 2.9 (*)    AST 51 (*)    Alkaline Phosphatase 161 (*)    Total Bilirubin 3.6 (*)    All other components within normal limits  CBC - Abnormal; Notable for the following components:   RBC 3.48 (*)    Hemoglobin 10.0 (*)    HCT 30.8 (*)    RDW 17.9 (*)    All other components within normal limits  PROTIME-INR - Abnormal; Notable for the following components:   Prothrombin Time 18.0 (*)    INR 1.5 (*)    All other components within normal limits  TROPONIN I (HIGH SENSITIVITY) - Abnormal; Notable for the following components:   Troponin I (High Sensitivity) 18 (*)    All other components within normal limits  TROPONIN I (HIGH SENSITIVITY) - Abnormal; Notable for the following components:    Troponin I (High Sensitivity) 23 (*)    All other components within normal limits  LIPASE, BLOOD  ETHANOL  URINALYSIS, ROUTINE W REFLEX MICROSCOPIC  AMMONIA  URINE DRUG SCREEN, QUALITATIVE ([facility] ONLY)  TYPE AND SCREEN     EKG  ED ECG REPORT I, Dynver Clemson J, the attending physician, personally viewed and interpreted this ECG.   Date: 05/05/2023  EKG Time: 2007  Rate: 98  Rhythm: normal sinus rhythm  Axis: Normal  Intervals: QTc 556  ST&T Change: Nonspecific    RADIOLOGY I have independently visualized and interpreted patient's x-ray and CT scans as well as noted the radiology interpretation:  X-ray: No acute cardiopulmonary process, mild cardiomegaly  CT head: No ICH  CT abdomen/pelvis: Hospital changes of acute cholecystitis, changes of cirrhosis, patent TIPS shunt  Official radiology report(s): CT ABDOMEN PELVIS W CONTRAST  Result Date: 05/05/2023 CLINICAL DATA:  Abdominal pain, acute, nonlocalized EXAM: CT ABDOMEN AND PELVIS WITH CONTRAST TECHNIQUE: Multidetector CT imaging of the abdomen and pelvis was performed using the standard protocol following bolus administration of intravenous contrast. RADIATION DOSE REDUCTION: This exam was performed according to the departmental dose-optimization program which includes automated exposure control, adjustment of the mA and/or kV according to patient size and/or use of iterative reconstruction technique. CONTRAST:  OMNIPAQUE IOHEXOL 300 MG/ML  SOLN COMPARISON:  09/30/2022 FINDINGS: Lower chest: No acute abnormality. Stable cardiomegaly. There is circumferential thickening of the  distal esophagus which appears stable since prior examination, poss fleck ting changes of esophagitis. Hepatobiliary: Right portal vein-hepatic vein tips shunt is in place extending from the portal bifurcation to the hepatocaval junction. The shunt is patent. Mild contour nodularity of the liver is seen in keeping with changes of cirrhosis. No  enhancing intrahepatic mass. The gallbladder is distended and there is trace pericholecystic inflammatory stranding which may reflect changes of acute cholecystitis. No intra or extrahepatic biliary ductal dilation. Pancreas: Unremarkable Spleen: Stable mild splenomegaly with the spleen measuring 13.9 cm in greatest dimension. No intrasplenic lesions are seen. Splenic vein is patent. Adrenals/Urinary Tract: Adrenal glands are unremarkable. Kidneys are normal, without renal calculi, focal lesion, or hydronephrosis. Bladder is unremarkable. Stomach/Bowel: The stomach, small bowel, and large bowel are unremarkable. The appendix is not clearly identified and is likely absent. No free intraperitoneal gas or fluid. Vascular/Lymphatic: Superior mesenteric artery stenting has been performed with the luminal diameter of the stented segment measuring 2 mm in diameter with small amount of neointimal hyperplasia noted. The vessel distally remains patent. Celiac axis demonstrates a 50-75% narrowing proximally secondary to mass effect from the median arcuate ligament. Inferior mesenteric artery is widely patent. Mild aortoiliac atherosclerotic calcification. No aortic aneurysm. Reproductive: Prostate is unremarkable. Other: No abdominal wall hernia Musculoskeletal: No acute bone abnormality. IMPRESSION: 1. Distended gallbladder with trace pericholecystic inflammatory stranding which may reflect changes of acute cholecystitis. Correlation with clinical examination and liver enzymes is recommended. If indicated, this would be better assessed with dedicated right upper quadrant sonography. 2. Morphologic changes in keeping with cirrhosis and portal venous hypertension with mild splenomegaly. Patent TIPS shunt. 3. Interval superior mesenteric artery stenting with luminal diameter of 2 mm with small amount of neointimal hyperplasia noted. The vessel distally remains patent. Given findings within the celiac axis below, clinical  correlation for signs and symptoms of chronic mesenteric ischemia is recommended. Endovascular revision of the stented segment may be warranted. 4. 50-75% narrowing of the proximal celiac axis secondary to mass effect from the median arcuate ligament. 5. Circumferential thickening of the distal esophagus, stable since prior examination, possibly reflecting changes of esophagitis. 6. Stable cardiomegaly. Aortic Atherosclerosis (ICD10-I70.0). Electronically Signed   By: Helyn Numbers M.D.   On: 05/05/2023 02:42   CT Head Wo Contrast  Result Date: 05/05/2023 CLINICAL DATA:  Altered mental status. EXAM: CT HEAD WITHOUT CONTRAST TECHNIQUE: Contiguous axial images were obtained from the base of the skull through the vertex without intravenous contrast. RADIATION DOSE REDUCTION: This exam was performed according to the departmental dose-optimization program which includes automated exposure control, adjustment of the mA and/or kV according to patient size and/or use of iterative reconstruction technique. COMPARISON:  Head CT dated 09/30/2022. FINDINGS: Brain: Mild age-related atrophy and chronic microvascular ischemic changes. There is no acute intracranial hemorrhage. No mass effect or midline shift. No extra-axial fluid collection. Vascular: No hyperdense vessel or unexpected calcification. Skull: Normal. Negative for fracture or focal lesion. Sinuses/Orbits: No acute finding. Other: None IMPRESSION: 1. No acute intracranial pathology. 2. Mild age-related atrophy and chronic microvascular ischemic changes. Electronically Signed   By: Elgie Collard M.D.   On: 05/05/2023 02:28   DG Chest 2 View  Result Date: 05/04/2023 CLINICAL DATA:  Chest pain. EXAM: CHEST - 2 VIEW COMPARISON:  Chest radiograph dated 04/16/2023. FINDINGS: No focal consolidation, pleural effusion, or pneumothorax. Mild cardiomegaly. No acute osseous pathology. IMPRESSION: 1. No active cardiopulmonary disease. 2. Mild cardiomegaly.  Electronically Signed   By: Elgie Collard  M.D.   On: 05/04/2023 23:41     PROCEDURES:  Critical Care performed: Yes, see critical care procedure note(s)  CRITICAL CARE Performed by: Irean Hong   Total critical care time: 45 minutes  Critical care time was exclusive of separately billable procedures and treating other patients.  Critical care was necessary to treat or prevent imminent or life-threatening deterioration.  Critical care was time spent personally by me on the following activities: development of treatment plan with patient and/or surrogate as well as nursing, discussions with consultants, evaluation of patient's response to treatment, examination of patient, obtaining history from patient or surrogate, ordering and performing treatments and interventions, ordering and review of laboratory studies, ordering and review of radiographic studies, pulse oximetry and re-evaluation of patient's condition.   Marland Kitchen1-3 Lead EKG Interpretation  Performed by: Irean Hong, MD Authorized by: Irean Hong, MD     Interpretation: normal     ECG rate:  98   ECG rate assessment: normal     Rhythm: sinus rhythm     Ectopy: none     Conduction: normal   Comments:     Patient placed on cardiac monitor to evaluate for arrhythmias  Rectal exam: External hemorrhoids.  Bright red blood per rectum.  No melena.   MEDICATIONS ORDERED IN ED: Medications  thiamine (VITAMIN B1) tablet 100 mg (has no administration in time range)  LORazepam (ATIVAN) injection 0-4 mg ( Intravenous Not Given 05/05/23 0203)  potassium chloride 10 mEq in 100 mL IVPB (has no administration in time range)  potassium chloride 10 mEq in 100 mL IVPB (10 mEq Intravenous New Bag/Given 05/05/23 0202)  sodium chloride 0.9 % bolus 500 mL (500 mLs Intravenous New Bag/Given 05/05/23 0159)  morphine (PF) 4 MG/ML injection 4 mg (4 mg Intravenous Given 05/05/23 0147)  iohexol (OMNIPAQUE) 300 MG/ML solution 100 mL (100 mLs Intravenous  Contrast Given 05/05/23 0219)  LORazepam (ATIVAN) injection 1 mg (1 mg Intravenous Given 05/05/23 0237)     IMPRESSION / MDM / ASSESSMENT AND PLAN / ED COURSE  I reviewed the triage vital signs and the nursing notes.                             52 year old male presenting with multiple medical complaints. Differential diagnosis includes, but is not limited to, ACS, aortic dissection, pulmonary embolism, cardiac tamponade, pneumothorax, pneumonia, pericarditis, myocarditis, GI-related causes including esophagitis/gastritis, and musculoskeletal chest wall pain.   I personally reviewed patient's hospitalization records from 4/22 - 04/29/2023.  Patient's presentation is most consistent with acute presentation with potential threat to life or bodily function.  The patient is on the cardiac monitor to evaluate for evidence of arrhythmia and/or significant heart rate changes.  Laboratory results demonstrate stable hemoglobin of 10, hypokalemia with potassium 2.7, mild elevation of bilirubin compared to prior.  Troponin is mildly elevated, likely secondary to demand ischemia.  Will obtain CT head, CT abdomen/pelvis.  Check ethanol and ammonia levels.  Placed on CIWA.  Administer morphine for pain.  Will reassess.  Clinical Course as of 05/05/23 0300  Mon May 05, 2023  0259 CT head negative for ICH.  CT abdomen/pelvis demonstrates TIPS in place.  Query acute cholecystitis, will obtain right upper quadrant abdominal ultrasound.  Will consult hospitalist services for evaluation and admission. [JS]    Clinical Course User Index [JS] Irean Hong, MD     FINAL CLINICAL IMPRESSION(S) / ED DIAGNOSES  Final diagnoses:  Hypokalemia  Chest pain, unspecified type  Generalized abdominal pain  Rectal bleeding     Rx / DC Orders   ED Discharge Orders     None        Note:  This document was prepared using Dragon voice recognition software and may include unintentional dictation errors.    Irean Hong, MD 05/05/23 [phone]

## 2023-05-05 NOTE — H&P (Addendum)
History and Physical    Ethan Wells HQI:[id number] DOB: May 20, 1971 DOA: 05/05/2023  Referring MD/NP/PA:   PCP: [name], NP   Patient coming from:  The patient is coming from boarding home per patient.   Chief Complaint: chest pain, abdominal pain, rectal bleeding  HPI: Ethan Wells is a 52 y.o. male with medical history significant of alcohol abuse, alcoholic liver cirrhosis with portal gastropathy, s/p of TIPS, superior mesenteric artery stenosis, HTN, HLd, CAD, STEMI, dCHF, GERD, depression with anxiety, adrenal insufficiency (Addison's disease), migraine headache, cardiac arrest due to V-fib, recent admission due to GI bleeding, cocaine abuse, tobacco abuse, who presents with chest pain, abdominal pain, rectal bleeding.  Patient was recently hospitalized from 4/22 - 5/13 due to upper GI bleeding. Pt is s/p of EGD with banding on 4/23 and s/p of TIPS procedure on 4/27. Pt states that he continues to use cocaine and drinking alcohol.  He states that he has chest pain since yesterday, which is located in front and right side of chest, intermittent, come and goes, morbid, both sharp and dull pain, nonradiating.  Associated with dry cough and mild shortness breath.  He also reports abdominal pain, which is diffuse, constant, aching, moderate, nonradiating.  Denies nausea, vomiting or diarrhea. No fever or chills.  He reports intermittent rectal bleeding with dark red stool.  Denies symptoms of UTI.  Per his mother, patient is not working, patient has disability.  Data reviewed independently and ED Course: pt was found to have trop  18 --> 23, UDS positive for benzo, cocaine and opiate, WBC 8.3, lactic acid 1.2, stable hemoglobin 10.0 --> 9.7 (9.3 on 04/26/2023), urinalysis (cloudy appearance, large amount of leukocyte, positive nitrite, negative bacteria, WBC > 50), ammonia level 59, potassium 2.7, magnesium 1.5, phosphorus 2.6, GFR> 60, INR 1.5, alcohol level less than  10, temperature normal, soft blood pressure 97/54, heart rate 102, RR 22, oxygen saturation 99% on room air.  Chest x-ray negative.  CT of head negative.  Patient is admitted to PCU as inpatient.  Dr. Gilda Crease for [facility] is consulted.  CT abdomen/pelvis: 1. Distended gallbladder with trace pericholecystic inflammatory stranding which may reflect changes of acute cholecystitis. Correlation with clinical examination and liver enzymes is recommended. If indicated, this would be better assessed with dedicated right upper quadrant sonography. 2. Morphologic changes in keeping with cirrhosis and portal venous hypertension with mild splenomegaly. Patent TIPS shunt. 3. Interval superior mesenteric artery stenting with luminal diameter of 2 mm with small amount of neointimal hyperplasia noted. The vessel distally remains patent. Given findings within the celiac axis below, clinical correlation for signs and symptoms of chronic mesenteric ischemia is recommended. Endovascular revision of the stented segment may be warranted. 4. 50-75% narrowing of the proximal celiac axis secondary to mass effect from the median arcuate ligament. 5. Circumferential thickening of the distal esophagus, stable since prior examination, possibly reflecting changes of esophagitis. 6. Stable cardiomegaly.   Aortic Atherosclerosis (ICD10-I70.0).   US-RUQ: 1. Gallbladder sludge without sonographic evidence of acutecholecystitis. 2. Hepatic cirrhosis. 3. Patent portal vein with normal direction of blood flow. There is retrograde flow within the left portal vein, expected following shunt placement.   EKG: I have personally reviewed.  Sinus rhythm, QTc 556, RAD, LAE, low voltage, septal infarction pattern.   Review of Systems:   General: no fevers, chills, no body weight gain, has poor appetite, has fatigue HEENT: no blurry vision, hearing changes or sore throat Respiratory: has dyspnea, coughing, no wheezing  CV: has  chest pain, no palpitations GI: no nausea, vomiting, has abdominal pain, no diarrhea, constipation GU: no dysuria, burning on urination, increased urinary frequency, hematuria  Ext: 1+ leg edema Neuro: no unilateral weakness, numbness, or tingling, no vision change or hearing loss.  Has lethargy Skin: no rash, no skin tear. MSK: No muscle spasm, no deformity, no limitation of range of movement in spin Heme: No easy bruising.  Travel history: No recent long distant travel.   Allergy:  Allergies  Allergen Reactions   Penicillins Hives and Rash    Tolerated Ceftriaxone 10/2022    Past Medical History:  Diagnosis Date   CAD (coronary artery disease)    a. 01/2011 Anterior STEMI/Cath/PCI: LM nl, LAD 100d (3.5x20mm Vision BMS placed), LCX 25m, RI 50, RCA min irregs, EF 40% w/ apical, inferoapical HK; b. 10/2022 NSTSEMI/Cath: LM nl, LAD 5p/m, RI 85, LCX 167m, OM1 mild dzs, OM3 100 fills via L->L collats from dLAD, RCA 80p, 131m, RPDA fills via collats from LAD. EF 45-50%-->Med Rx.   Cardiac arrest - ventricular fibrillation    a. In setting of STEMI 01/2011   Chronic pain    Cocaine abuse (HCC)    Depression with anxiety    ETOH abuse    a. 6-12 beers/day   GERD (gastroesophageal reflux disease)    GI bleed    Hemorrhoids    Hyperlipidemia    Hypertension    Ischemic cardiomyopathy    a. 06/2011 Echo: EF 45-50%, No rwma; b. 10/2022 Echo: EF 55-60%, no rwma, nl RV fxn.   Marijuana abuse    Migraines    Tobacco abuse    a. 1/2 ppd x 26 yrs    Past Surgical History:  Procedure Laterality Date   COLONOSCOPY  08/09/2021   Procedure: COLONOSCOPY;  Surgeon: Midge Minium, MD;  [facility] END[facility];  Service: Endoscopy;;   CORONARY ANGIOPLASTY WITH STENT PLACEMENT     ESOPHAGEAL BANDING  04/08/2023   Procedure: ESOPHAGEAL BANDING;  Surgeon: Tressia Danas, MD;  Location: [facility] ENDOSCOPY;  Service: Gastroenterology;;   ESOPHAGOGASTRODUODENOSCOPY N/A 05/30/2022   Procedure:  ESOPHAGOGASTRODUODENOSCOPY (EGD);  Surgeon: Toney Reil, MD;  Location: [facility] ENDOSCOPY;  Service: Gastroenterology;  Laterality: N/A;   ESOPHAGOGASTRODUODENOSCOPY (EGD) WITH PROPOFOL N/A 03/01/2021   Procedure: ESOPHAGOGASTRODUODENOSCOPY (EGD) WITH PROPOFOL;  Surgeon: Toney Reil, MD;  Location: [facility] SURGERY CNTR;  Service: Endoscopy;  Laterality: N/A;   ESOPHAGOGASTRODUODENOSCOPY (EGD) WITH PROPOFOL N/A 08/09/2021   Procedure: ESOPHAGOGASTRODUODENOSCOPY (EGD) WITH PROPOFOL;  Surgeon: Midge Minium, MD;  Location[facility] ENDOSCOPY;  Service: Endoscopy;  Laterality: N/A;   ESOPHAGOGASTRODUODENOSCOPY (EGD) WITH PROPOFOL N/A 03/01/2023   Procedure: ESOPHAGOGASTRODUODENOSCOPY (EGD) WITH PROPOFOL;  Surgeon: Jaynie Collins, DO;  Location: [facility] ENDOSCOPY;  Service: Gastroenterology;  Laterality: N/A;   ESOPHAGOGASTRODUODENOSCOPY (EGD) WITH PROPOFOL N/A 04/08/2023   Procedure: ESOPHAGOGASTRODUODENOSCOPY (EGD) WITH PROPOFOL;  Surgeon: Tressia Danas, MD;  Location: [facility] ENDOSCOPY;  Service: Gastroenterology;  Laterality: N/A;   EVALUATION UNDER ANESTHESIA WITH HEMORRHOIDECTOMY N/A 10/31/2022   Procedure: EXAM UNDER ANESTHESIA WITH SUTURE LIGATION OF TWO BLEEDING PEDICLES;  Surgeon: Henrene Dodge, MD;  Location: [facility] ORS;  Service: General;  Laterality: N/A;   FLEXIBLE SIGMOIDOSCOPY N/A 10/02/2022   Procedure: FLEXIBLE SIGMOIDOSCOPY;  Surgeon: Meryl Dare, MD;  Location: [facility] END[facility]COPY;  Service: Gastroenterology;  Laterality: N/A;   IR TIPS  04/11/2023   IR US GUIDE VASC ACCESS RIGHT  04/11/2023   IR US GUIDE VASC ACCESS RIGHT  04/11/2023   IR US GUIDE VASC ACCESS  RIGHT  04/11/2023   LEFT HEART CATH AND CORONARY ANGIOGRAPHY N/A 11/04/2022   Procedure: LEFT HEART CATH AND CORONARY ANGIOGRAPHY;  Surgeon: Iran Ouch, MD;  Location: [facility] INVASIVE CV LAB;  Service: Cardiovascular;  Laterality: N/A;   RADIOLOGY WITH ANESTHESIA N/A 04/11/2023   Procedure: IR WITH ANESTHESIA;  Surgeon:  Oley Balm, MD;  Location: [facility] OR;  Service: Radiology;  Laterality: N/A;   VISCERAL ANGIOGRAPHY N/A 10/24/2022   Procedure: VISCERAL ANGIOGRAPHY;  Surgeon: Annice Needy, MD;  Location: [facility] INVASIVE CV LAB;  Service: Cardiovascular;  Laterality: N/A;    Social History:  reports that he has been smoking cigarettes. He has a 20.50 pack-year smoking history. He has never used smokeless tobacco. He reports current alcohol use of about 12.0 standard drinks of alcohol per week. He reports current drug use. Drugs: Cocaine and Marijuana.  Family History:  Family History  Problem Relation Age of Onset   Coronary artery disease Mother        alive   Other Other        2 sisters alive and well   Alcohol abuse Father        died @ 74   Other Paternal Grandmother        PACER     Prior to Admission medications   Medication Sig Start Date End Date Taking? Authorizing Provider  folic acid (FOLVITE) 1 MG tablet Take 1 tablet (1 mg total) by mouth daily. 04/29/23   Ghimire, Werner Lean, MD  furosemide (LASIX) 20 MG tablet Take 1 tablet (20 mg total) by mouth daily. 04/29/23   Ghimire, Werner Lean, MD  gabapentin (NEURONTIN) 300 MG capsule Take 1 capsule (300 mg total) by mouth 2 (two) times daily. 04/28/23 06/27/23  Ghimire, Werner Lean, MD  hydrocortisone (CORTEF) 10 MG tablet Take 1 tablet (10 mg total) by mouth daily. 04/28/23 06/27/23  Ghimire, Werner Lean, MD  hydrocortisone (CORTEF) 10 MG tablet Take 1/2 tablet (5 mg total) by mouth daily after lunch. 04/28/23 04/27/24  Ghimire, Werner Lean, MD  lactulose (CHRONULAC) 10 GM/15ML solution Take 30 mLs (20 g total) by mouth 3 (three) times daily. Titrate to 1-2 BM per day 04/28/23 06/27/23  Maretta Bees, MD  midodrine (PROAMATINE) 5 MG tablet Take 1 tablet (5 mg total) by mouth 3 (three) times daily. Skip the dose if Systolic BP greater than 110 mmHg and or HR greater than 100 04/28/23 07/27/23  Ghimire, Werner Lean, MD  Multiple Vitamin (MULTIVITAMIN WITH MINERALS)  TABS tablet Take 1 tablet by mouth daily. 04/28/23 06/27/23  Ghimire, Werner Lean, MD  Multiple Vitamin (MULTIVITAMIN WITH MINERALS) TABS tablet Take 1 tablet by mouth daily. 04/29/23   Ghimire, Werner Lean, MD  nitroGLYCERIN (NITROSTAT) 0.4 MG SL tablet Place 1 tablet (0.4 mg total) under the tongue every 5 (five) minutes as needed for chest pain. 04/28/23   Ghimire, Werner Lean, MD  pantoprazole (PROTONIX) 40 MG tablet Take 1 tablet (40 mg total) by mouth 2 (two) times daily. 04/28/23 06/27/23  Ghimire, Werner Lean, MD  potassium chloride SA (KLOR-CON M) 20 MEQ tablet Take 1 tablet (20 mEq total) by mouth daily. 04/28/23   Ghimire, Werner Lean, MD  QUEtiapine (SEROQUEL) 50 MG tablet Take 1 tablet (50 mg total) by mouth at bedtime. 04/28/23 06/27/23  Ghimire, Werner Lean, MD  rifaximin (XIFAXAN) 550 MG TABS tablet Take 1 tablet (550 mg total) by mouth 2 (two) times daily. 04/28/23   Ghimire, Werner Lean, MD  rosuvastatin (  CRESTOR) 40 MG tablet Take 1 tablet (40 mg total) by mouth daily. 04/28/23 04/27/24  Ghimire, Werner Lean, MD  spironolactone (ALDACTONE) 25 MG tablet Take 0.5 tablets (12.5 mg total) by mouth daily. 04/28/23 07/27/23  Ghimire, Werner Lean, MD  thiamine (VITAMIN B1) 100 MG tablet Take 1 tablet (100 mg total) by mouth daily. 04/29/23   Maretta Bees, MD    Physical Exam: Vitals:   05/05/23 0755 05/05/23 0827 05/05/23 1117 05/05/23 1500  BP: (!) 106/58 (!) 100/50 104/62 (!) 103/57  Pulse: 94 87 88 82  Resp: 17  18 18   Temp: 98 F (36.7 C)  98 F (36.7 C) 98.2 F (36.8 C)  TempSrc: Oral     SpO2: 98%  98% 92%  Weight:      Height:       General: Not in acute distress HEENT:       Eyes: PERRL, EOMI, no scleral icterus.       ENT: No discharge from the ears and nose, no pharynx injection, no tonsillar enlargement.        Neck: No JVD, no bruit, no mass felt. Heme: No neck lymph node enlargement. Cardiac: S1/S2, RRR, No murmurs, No gallops or rubs. Respiratory: No rales, wheezing, rhonchi or  rubs. GI: Soft, nondistended, has diffusely tenderness, no rebound pain, no organomegaly, BS present. GU: No hematuria Ext: 1+ pitting leg edema bilaterally. 1+DP/PT pulse bilaterally. Musculoskeletal: No joint deformities, No joint redness or warmth, no limitation of ROM in spin. Skin: No rashes.  Neuro: Patient is lethargic, but easily arousable, oriented x 3, cranial nerves II-XII grossly intact, moves all extremities normally.  Psych: Patient is not psychotic, no suicidal or hemocidal ideation.  Labs on Admission: I have personally reviewed following labs and imaging studies  CBC: Recent Labs  Lab 05/04/23 1947 05/05/23 0956 05/05/23 1313  WBC 8.3 5.3 5.6  HGB 10.0* 9.7* 9.2*  HCT 30.8* 30.0* 28.5*  MCV 88.5 89.3 88.2  PLT 236 180 181   Basic Metabolic Panel: Recent Labs  Lab 05/04/23 1947 05/05/23 0131  NA 134*  --   K 2.7*  --   CL 99  --   CO2 23  --   GLUCOSE 115*  --   BUN <5*  --   CREATININE 0.80  --   CALCIUM 8.8*  --   MG  --  1.5*  PHOS  --  2.6   GFR: Estimated Creatinine Clearance: 107.3 mL/min (by C-G formula based on SCr of 0.8 mg/dL). Liver Function Tests: Recent Labs  Lab 05/04/23 1947  AST 51*  ALT 24  ALKPHOS 161*  BILITOT 3.6*  PROT 7.8  ALBUMIN 2.9*   Recent Labs  Lab 05/04/23 1947  LIPASE 31   Recent Labs  Lab 05/05/23 0132  AMMONIA 59*   Coagulation Profile: Recent Labs  Lab 05/05/23 0132  INR 1.5*   Cardiac Enzymes: No results for input(s): "CKTOTAL", "CKMB", "CKMBINDEX", "TROPONINI" in the last 168 hours. BNP (last 3 results) No results for input(s): "PROBNP" in the last 8760 hours. HbA1C: Recent Labs    05/04/23 1947  HGBA1C 5.2   CBG: Recent Labs  Lab 04/28/23 1644 04/28/23 2211 04/29/23 0806  GLUCAP 154* 154* 105*   Lipid Profile: Recent Labs    05/05/23 0131  CHOL 135  HDL 15*  LDLCALC 100*  TRIG 98  CHOLHDL 9.0   Thyroid Function Tests: No results for input(s): "TSH", "T4TOTAL", "FREET4",  "T3FREE", "THYROIDAB" in the last 72  hours. Anemia Panel: No results for input(s): "VITAMINB12", "FOLATE", "FERRITIN", "TIBC", "IRON", "RETICCTPCT" in the last 72 hours. Urine analysis:    Component Value Date/Time   COLORURINE YELLOW (A) 05/05/2023 0132   APPEARANCEUR CLOUDY (A) 05/05/2023 0132   APPEARANCEUR Clear 03/16/2012 1315   LABSPEC 1.027 05/05/2023 0132   LABSPEC 1.013 03/16/2012 1315   PHURINE 8.0 05/05/2023 0132   GLUCOSEU NEGATIVE 05/05/2023 0132   GLUCOSEU 50 mg/dL 82/95/6213 0865   HGBUR SMALL (A) 05/05/2023 0132   BILIRUBINUR NEGATIVE 05/05/2023 0132   BILIRUBINUR Negative 03/16/2012 1315   KETONESUR NEGATIVE 05/05/2023 0132   PROTEINUR NEGATIVE 05/05/2023 0132   NITRITE POSITIVE (A) 05/05/2023 0132   LEUKOCYTESUR LARGE (A) 05/05/2023 0132   LEUKOCYTESUR Negative 03/16/2012 1315   Sepsis Labs: @LABRCNTIP (procalcitonin:4,lacticidven:4) )No results found for this or any previous visit (from the past 240 hour(s)).   Radiological Exams on Admission: US ABDOMEN LIMITED RUQ (LIVER/GB)  Result Date: 05/05/2023 CLINICAL DATA:  Right upper quadrant abdominal pain EXAM: ULTRASOUND ABDOMEN LIMITED RIGHT UPPER QUADRANT COMPARISON:  None Available. FINDINGS: Gallbladder: The gallbladder is mildly distended and a small amount of layering sludge is seen within the gallbladder. However, there is no gallbladder wall thickening and no pericholecystic fluid is identified. The sonographic Eulah Pont sign is reportedly NEGATIVE. Common bile duct: Diameter: 4 mm in proximal diameter Liver: Subtle contour nodularity of the liver is seen in keeping with cirrhosis. Tips shunt is partially visualized within the right hepatic lobe. No focal intrahepatic mass is identified. No intrahepatic biliary ductal dilation. Portal vein is patent on color Doppler imaging with normal direction of blood flow towards the liver. There is antegrade flow within the proximal portion of the shunt and retrograde flow  within the left portal vein, expected following shunt placement. Other: No ascites IMPRESSION: 1. Gallbladder sludge without sonographic evidence of acute cholecystitis. 2. Hepatic cirrhosis. 3. Patent portal vein with normal direction of blood flow. There is retrograde flow within the left portal vein, expected following shunt placement. Electronically Signed   By: Helyn Numbers M.D.   On: 05/05/2023 04:03   CT ABDOMEN PELVIS W CONTRAST  Result Date: 05/05/2023 CLINICAL DATA:  Abdominal pain, acute, nonlocalized EXAM: CT ABDOMEN AND PELVIS WITH CONTRAST TECHNIQUE: Multidetector CT imaging of the abdomen and pelvis was performed using the standard protocol following bolus administration of intravenous contrast. RADIATION DOSE REDUCTION: This exam was performed according to the departmental dose-optimization program which includes automated exposure control, adjustment of the mA and/or kV according to patient size and/or use of iterative reconstruction technique. CONTRAST:  OMNIPAQUE IOHEXOL 300 MG/ML  SOLN COMPARISON:  09/30/2022 FINDINGS: Lower chest: No acute abnormality. Stable cardiomegaly. There is circumferential thickening of the distal esophagus which appears stable since prior examination, poss fleck ting changes of esophagitis. Hepatobiliary: Right portal vein-hepatic vein tips shunt is in place extending from the portal bifurcation to the hepatocaval junction. The shunt is patent. Mild contour nodularity of the liver is seen in keeping with changes of cirrhosis. No enhancing intrahepatic mass. The gallbladder is distended and there is trace pericholecystic inflammatory stranding which may reflect changes of acute cholecystitis. No intra or extrahepatic biliary ductal dilation. Pancreas: Unremarkable Spleen: Stable mild splenomegaly with the spleen measuring 13.9 cm in greatest dimension. No intrasplenic lesions are seen. Splenic vein is patent. Adrenals/Urinary Tract: Adrenal glands are  unremarkable. Kidneys are normal, without renal calculi, focal lesion, or hydronephrosis. Bladder is unremarkable. Stomach/Bowel: The stomach, small bowel, and large bowel are unremarkable. The appendix is  not clearly identified and is likely absent. No free intraperitoneal gas or fluid. Vascular/Lymphatic: Superior mesenteric artery stenting has been performed with the luminal diameter of the stented segment measuring 2 mm in diameter with small amount of neointimal hyperplasia noted. The vessel distally remains patent. Celiac axis demonstrates a 50-75% narrowing proximally secondary to mass effect from the median arcuate ligament. Inferior mesenteric artery is widely patent. Mild aortoiliac atherosclerotic calcification. No aortic aneurysm. Reproductive: Prostate is unremarkable. Other: No abdominal wall hernia Musculoskeletal: No acute bone abnormality. IMPRESSION: 1. Distended gallbladder with trace pericholecystic inflammatory stranding which may reflect changes of acute cholecystitis. Correlation with clinical examination and liver enzymes is recommended. If indicated, this would be better assessed with dedicated right upper quadrant sonography. 2. Morphologic changes in keeping with cirrhosis and portal venous hypertension with mild splenomegaly. Patent TIPS shunt. 3. Interval superior mesenteric artery stenting with luminal diameter of 2 mm with small amount of neointimal hyperplasia noted. The vessel distally remains patent. Given findings within the celiac axis below, clinical correlation for signs and symptoms of chronic mesenteric ischemia is recommended. Endovascular revision of the stented segment may be warranted. 4. 50-75% narrowing of the proximal celiac axis secondary to mass effect from the median arcuate ligament. 5. Circumferential thickening of the distal esophagus, stable since prior examination, possibly reflecting changes of esophagitis. 6. Stable cardiomegaly. Aortic Atherosclerosis  (ICD10-I70.0). Electronically Signed   By: Helyn Numbers M.D.   On: 05/05/2023 02:42   CT Head Wo Contrast  Result Date: 05/05/2023 CLINICAL DATA:  Altered mental status. EXAM: CT HEAD WITHOUT CONTRAST TECHNIQUE: Contiguous axial images were obtained from the base of the skull through the vertex without intravenous contrast. RADIATION DOSE REDUCTION: This exam was performed according to the departmental dose-optimization program which includes automated exposure control, adjustment of the mA and/or kV according to patient size and/or use of iterative reconstruction technique. COMPARISON:  Head CT dated 09/30/2022. FINDINGS: Brain: Mild age-related atrophy and chronic microvascular ischemic changes. There is no acute intracranial hemorrhage. No mass effect or midline shift. No extra-axial fluid collection. Vascular: No hyperdense vessel or unexpected calcification. Skull: Normal. Negative for fracture or focal lesion. Sinuses/Orbits: No acute finding. Other: None IMPRESSION: 1. No acute intracranial pathology. 2. Mild age-related atrophy and chronic microvascular ischemic changes. Electronically Signed   By: Elgie Collard M.D.   On: 05/05/2023 02:28   DG Chest 2 View  Result Date: 05/04/2023 CLINICAL DATA:  Chest pain. EXAM: CHEST - 2 VIEW COMPARISON:  Chest radiograph dated 04/16/2023. FINDINGS: No focal consolidation, pleural effusion, or pneumothorax. Mild cardiomegaly. No acute osseous pathology. IMPRESSION: 1. No active cardiopulmonary disease. 2. Mild cardiomegaly. Electronically Signed   By: Elgie Collard M.D.   On: 05/04/2023 23:41      Assessment/Plan Principal Problem:   GI bleeding Active Problems:   Anemia due to GI blood loss   Adrenal insufficiency (Addison's disease) (HCC)   Alcoholic cirrhosis (HCC)   Abdominal pain   Mesenteric artery stenosis (HCC)   Chest pain   CAD (coronary artery disease)   UTI (urinary tract infection)   HLD (hyperlipidemia)   Chronic diastolic  CHF (congestive heart failure) (HCC)   Hypomagnesemia   Hypokalemia   Essential hypertension   Depression with anxiety   Tobacco abuse   Cocaine abuse (HCC)   Polysubstance abuse (HCC)   Alcohol abuse   Assessment and Plan:  GI bleeding and anemia due to GI blood loss: Hgb 9.3 on 04/26/23 --. 10.0 --> 9.7 --> 9.2,  slowly trending down.  Consulted Dr. Tobi Bastos of GI.  - will admitted to progressive bed as inpatient - IVF: 500 ml of NS bolus, then at 50 mL/hr - Start IV pantoprazole 40 bid - Start Octreotide gtt --> Ok to d/c per Dr. Allegra Lai - Zofran IV for nausea - Avoid NSAIDs and SQ heparin - Maintain IV access (2 large bore IVs if possible). - Monitor closely and follow q6h cbc, transfuse as necessary, if Hgb<7.0 - LaB: INR, PTT and type screen  Adrenal insufficiency (Addison's disease) (HCC) -continue home Cortef 5 mg daily  Alcoholic cirrhosis (HCC): INR 1.5, ammonia 59, mental status is okay -Check PTT -Lactulose 20 g 3 times daily -Rifaximin -Hold Lasix and spironolactone due to soft blood pressure  Abdominal pain and superior mesenteric artery stenosis: Etiology for his abdominal pain is not clear.  Potential differential diagnosis include superior mesenteric artery stenosis, SBP, UTI.  CT scan showed distended gallbladder, but US-RUQ has no evidence of cholecystitis.  Patient does not have fever or leukocytosis.  Does not seem to have acute cholecystitis.  Patient has history of superior mesenteric artery stenosis, CT scan also showed some abnormalities in the SMA.  -will consult Dr. Gilda Crease of [facility] -Pain control: As needed morphine and oxycodone  Hx of CAD and chest pain: Possibly due to cocaine abuse.  Troponin level 18, 23, 17.  UDS is positive for cocaine. -Will not give aspirin due to GI bleeding -Crestor -Check A1c, FLP  UTI (urinary tract infection) -IV Rocephin -Urine culture  HLD (hyperlipidemia) -Crestor  Chronic diastolic CHF (congestive heart failure)  (HCC): 2D echo on 04/11/2023 showed EF of 55-60% with grade 2 diastolic dysfunction.  Patient mild plus leg edema, elevated BNP 426, but no oxygen desaturation.  Chest x-ray negative for pulmonary edema.  Does not seem to have acute CHF exacerbation, but patient had a high risk of developing CHF exacerbation.  Due to soft blood pressure, will not start IV diuretics. -Hold Lasix and spironolactone  Hypomagnesemia and Hypokalemia: Potassium 2.7, magnesium of 1.5, phosphorus 2.6 -Repleted magnesium and potassium  Essential hypertension: Blood pressure soft 97/54 -Hold all blood pressure medications and diuretics due to soft blood pressure -Midodrine 10 mg 3 times daily  Depression with anxiety -Continue home medications  Polysubstance abuse: Cocaine, alcohol, tobacco -Did counseling about importance of quitting substance use -Nicotine patch -CIWA protocol       DVT ppx: SCD  Code Status: Full code  Family Communication:   Yes, patient's son by phone  Disposition Plan:  Anticipate discharge back to previous environment  Consults called:  Dr. Gilda Crease  Admission status and Level of care: Progressive:   as inpt      Dispo: The patient is from: Boarding home              Anticipated d/c is to:  To be determined              Anticipated d/c date is: 2 days              Patient currently is not medically stable to d/c.    Severity of Illness:  The appropriate patient status for this patient is INPATIENT. Inpatient status is judged to be reasonable and necessary in order to provide the required intensity of service to ensure the patient's safety. The patient's presenting symptoms, physical exam findings, and initial radiographic and laboratory data in the context of their chronic comorbidities is felt to place them at high risk for further  clinical deterioration. Furthermore, it is not anticipated that the patient will be medically stable for discharge from the hospital within 2  midnights of admission.   * I certify that at the point of admission it is my clinical judgment that the patient will require inpatient hospital care spanning beyond 2 midnights from the point of admission due to high intensity of service, high risk for further deterioration and high frequency of surveillance required.*       Date of Service 05/05/2023    Lorretta Harp [facility]   If 7PM-7AM, please contact night-coverage [email or web address] 05/05/2023, 4:38 PM

## 2023-05-05 NOTE — Consult Note (Signed)
MRN : [id number]  Ethan Wells is a 52 y.o. (March 22, 1971) male who presents with chief complaint of check circulation.  History of Present Illness:   I am asked to evaluate the patient by Dr. Clyde Lundborg.  Patient is a 52 year old gentleman with numerous complex medical problems who was recently discharged after a lengthy hospitalization during which time TIPS procedure was performed he also sustained a V. tach arrest as well as significant upper GI bleeding.  He presented to [facility] yesterday with complaints of chest pain abdominal pain 1 bloody bowel movement and excruciating lower back and leg pain.  Patient is seen in the ER and his primary complaint is the excruciating pain in his lower back and both legs associated with weakness so that he is falling down and having to crawl on the floor.  The patient is a very poor historian but it appears that his p.o. intake has been extremely poor since last Tuesday although I do not get the sense of true postprandial pain or food fear.  He is status post angioplasty and stent placement of the SMA October 24, 2022 at which time a 7 mm diameter Lifestream stent was placed and then postdilated to 8 mm.  At the time of angiography a 75 to 80% celiac artery stenosis was also identified.  CT scan of the abdomen performed earlier today is consistent with a high-grade restenosis of the SMA stent.  Persistent celiac artery stenosis is again noted.  Numerous other findings consistent with portal hypertension and advanced cirrhosis are also noted.  His TIPS procedure appears to be patent.  Current Meds  Medication Sig   folic acid (FOLVITE) 1 MG tablet Take 1 tablet (1 mg total) by mouth daily.   furosemide (LASIX) 20 MG tablet Take 1 tablet (20 mg total) by mouth daily.   gabapentin (NEURONTIN) 300 MG capsule Take 1 capsule (300 mg total) by mouth 2 (two) times  daily.   hydrocortisone (CORTEF) 10 MG tablet Take 1 tablet (10 mg total) by mouth daily.   hydrocortisone (CORTEF) 10 MG tablet Take 1/2 tablet (5 mg total) by mouth daily after lunch.   lactulose (CHRONULAC) 10 GM/15ML solution Take 30 mLs (20 g total) by mouth 3 (three) times daily. Titrate to 1-2 BM per day   midodrine (PROAMATINE) 5 MG tablet Take 1 tablet (5 mg total) by mouth 3 (three) times daily. Skip the dose if Systolic BP greater than 110 mmHg and or HR greater than 100   Multiple Vitamin (MULTIVITAMIN WITH MINERALS) TABS tablet Take 1 tablet by mouth daily.   nitroGLYCERIN (NITROSTAT) 0.4 MG SL tablet Place 1 tablet (0.4 mg total) under the tongue every 5 (five) minutes as needed for chest pain.   pantoprazole (PROTONIX) 40 MG tablet Take 1 tablet (40 mg total) by mouth 2 (two) times daily.   potassium chloride SA (KLOR-CON M) 20 MEQ tablet Take 1 tablet (20 mEq total) by mouth daily.   QUEtiapine (SEROQUEL) 50 MG tablet Take 1 tablet (50 mg total) by mouth at bedtime.   rifaximin (XIFAXAN) 550 MG  TABS tablet Take 1 tablet (550 mg total) by mouth 2 (two) times daily.   rosuvastatin (CRESTOR) 40 MG tablet Take 1 tablet (40 mg total) by mouth daily.   spironolactone (ALDACTONE) 25 MG tablet Take 0.5 tablets (12.5 mg total) by mouth daily.   thiamine (VITAMIN B1) 100 MG tablet Take 1 tablet (100 mg total) by mouth daily.    Past Medical History:  Diagnosis Date   CAD (coronary artery disease)    a. 01/2011 Anterior STEMI/Cath/PCI: LM nl, LAD 100d (3.5x69mm Vision BMS placed), LCX 26m, RI 50, RCA min irregs, EF 40% w/ apical, inferoapical HK; b. 10/2022 NSTSEMI/Cath: LM nl, LAD 5p/m, RI 85, LCX 13m, OM1 mild dzs, OM3 100 fills via L->L collats from dLAD, RCA 80p, 110m, RPDA fills via collats from LAD. EF 45-50%-->Med Rx.   Cardiac arrest - ventricular fibrillation    a. In setting of STEMI 01/2011   Chronic pain    Cocaine abuse (HCC)    Depression with anxiety    ETOH abuse    a.  6-12 beers/day   GERD (gastroesophageal reflux disease)    GI bleed    Hemorrhoids    Hyperlipidemia    Hypertension    Ischemic cardiomyopathy    a. 06/2011 Echo: EF 45-50%, No rwma; b. 10/2022 Echo: EF 55-60%, no rwma, nl RV fxn.   Marijuana abuse    Migraines    Tobacco abuse    a. 1/2 ppd x 26 yrs    Past Surgical History:  Procedure Laterality Date   COLONOSCOPY  08/09/2021   Procedure: COLONOSCOPY;  Surgeon: Midge Minium, MD;  Location: [facility] ENDOSCOPY;  Service: Endoscopy;;   CORONARY ANGIOPLASTY WITH STENT PLACEMENT     ESOPHAGEAL BANDING  04/08/2023   Procedure: ESOPHAGEAL BANDING;  Surgeon: Tressia Danas, MD;  Location: [facility] ENDOSCOPY;  Service: Gastroenterology;;   ESOPHAGOGASTRODUODENOSCOPY N/A 05/30/2022   Procedure: ESOPHAGOGASTRODUODENOSCOPY (EGD);  Surgeon: Toney Reil, MD;  Location: [facility] ENDOSCOPY;  Service: Gastroenterology;  Laterality: N/A;   ESOPHAGOGASTRODUODENOSCOPY (EGD) WITH PROPOFOL N/A 03/01/2021   Procedure: ESOPHAGOGASTRODUODENOSCOPY (EGD) WITH PROPOFOL;  Surgeon: Toney Reil, MD;  Location: [facility];  Service: Endoscopy;  Laterality: N/A;   ESOPHAGOGASTRODUODENOSCOPY (EGD) WITH PROPOFOL N/A 08/09/2021   Procedure: ESOPHAGOGASTRODUODENOSCOPY (EGD) WITH PROPOFOL;  Surgeon: Midge Minium, MD;  Location: [facility] ENDOSCOPY;  Service: Endoscopy;  Laterality: N/A;   ESOPHAGOGASTRODUODENOSCOPY (EGD) WITH PROPOFOL N/A 03/01/2023   Procedure: ESOPHAGOGASTRODUODENOSCOPY (EGD) WITH PROPOFOL;  Surgeon: Jaynie Collins, DO;  Location: [facility] ENDOSCOPY;  Service: Gastroenterology;  Laterality: N/A;   ESOPHAGOGASTRODUODENOSCOPY (EGD) WITH PROPOFOL N/A 04/08/2023   Procedure: ESOPHAGOGASTRODUODENOSCOPY (EGD) WITH PROPOFOL;  Surgeon: Tressia Danas, MD;  Location: [facility] ENDOSCOPY;  Service: Gastroenterology;  Laterality: N/A;   EVALUATION UNDER ANESTHESIA WITH HEMORRHOIDECTOMY N/A 10/31/2022   Procedure: EXAM UNDER ANESTHESIA WITH SUTURE LIGATION OF  TWO BLEEDING PEDICLES;  Surgeon: Henrene Dodge, MD;  Location: [facility] ORS;  Service: General;  Laterality: N/A;   FLEXIBLE SIGMOIDOSCOPY N/A 10/02/2022   Procedure: FLEXIBLE SIGMOIDOSCOPY;  Surgeon: Meryl Dare, MD;  Location: [facility] ENDOSCOPY;  Service: Gastroenterology;  Laterality: N/A;   IR TIPS  04/11/2023   IR US GUIDE VASC ACCESS RIGHT  04/11/2023   IR US GUIDE VASC ACCESS RIGHT  04/11/2023   IR US GUIDE VASC ACCESS RIGHT  04/11/2023   LEFT HEART CATH AND CORONARY ANGIOGRAPHY N/A 11/04/2022   Procedure: LEFT HEART CATH AND CORONARY ANGIOGRAPHY;  Surgeon: Iran Ouch, MD;  Location: [facility] INVASIVE CV LAB;  Service:  Cardiovascular;  Laterality: N/A;   RADIOLOGY WITH ANESTHESIA N/A 04/11/2023   Procedure: IR WITH ANESTHESIA;  Surgeon: Oley Balm, MD;  Location: [facility] OR;  Service: Radiology;  Laterality: N/A;   VISCERAL ANGIOGRAPHY N/A 10/24/2022   Procedure: VISCERAL ANGIOGRAPHY;  Surgeon: Annice Needy, MD;  Location: [facility] INVASIVE CV LAB;  Service: Cardiovascular;  Laterality: N/A;    Social History Social History   Tobacco Use   Smoking status: Every Day    Packs/day: 0.50    Years: 41.00    Additional pack years: 0.00    Total pack years: 20.50    Types: Cigarettes   Smokeless tobacco: Never   Tobacco comments:    started age 32  Vaping Use   Vaping Use: Never used  Substance Use Topics   Alcohol use: Yes    Alcohol/week: 12.0 standard drinks of alcohol    Types: 12 Cans of beer per week    Comment: at least a 6 pack of beer daily and often a 12 pack   Drug use: Yes    Types: Cocaine, Marijuana    Family History Family History  Problem Relation Age of Onset   Coronary artery disease Mother        alive   Other Other        2 sisters alive and well   Alcohol abuse Father        died @ 61   Other Paternal Grandmother        PACER    Allergies  Allergen Reactions   Penicillins Hives and Rash    Tolerated Ceftriaxone 10/2022     REVIEW OF SYSTEMS  (Negative unless checked)  Constitutional: [] Weight loss  [] Fever  [] Chills Cardiac: [] Chest pain   [] Chest pressure   [] Palpitations   [] Shortness of breath when laying flat   [] Shortness of breath with exertion. Vascular:  [x] Pain in legs with walking   [] Pain in legs at rest  [] History of DVT   [] Phlebitis   [] Swelling in legs   [] Varicose veins   [] Non-healing ulcers Pulmonary:   [] Uses home oxygen   [] Productive cough   [] Hemoptysis   [] Wheeze  [] COPD   [] Asthma Neurologic:  [] Dizziness   [] Seizures   [] History of stroke   [] History of TIA  [] Aphasia   [] Vissual changes   [] Weakness or numbness in arm   [] Weakness or numbness in leg Musculoskeletal:   [] Joint swelling   [] Joint pain   [] Low back pain Hematologic:  [] Easy bruising  [] Easy bleeding   [] Hypercoagulable state   [] Anemic Gastrointestinal:  [] Diarrhea   [] Vomiting  [] Gastroesophageal reflux/heartburn   [] Difficulty swallowing. Genitourinary:  [] Chronic kidney disease   [] Difficult urination  [] Frequent urination   [] Blood in urine Skin:  [] Rashes   [] Ulcers  Psychological:  [] History of anxiety   []  History of major depression.  Physical Examination  Vitals:   05/05/23 0755 05/05/23 0827 05/05/23 1117 05/05/23 1500  BP: (!) 106/58 (!) 100/50 104/62 (!) 103/57  Pulse: 94 87 88 82  Resp: 17  18 18   Temp: 98 F (36.7 C)  98 F (36.7 C) 98.2 F (36.8 C)  TempSrc: Oral     SpO2: 98%  98% 92%  Weight:      Height:       Body mass index is 29.12 kg/m. Gen: WD/WN, NAD Head: [place]/AT, No temporalis wasting.  Ear/Nose/Throat: Hearing grossly intact, nares w/o erythema or drainage Eyes: PER, EOMI, sclera nonicteric.  Neck: Supple, no masses.  No bruit or JVD.  Pulmonary:  Good air movement, no audible wheezing, no use of accessory muscles.  Cardiac: RRR, normal S1, S2, no Murmurs. Vascular:   Vessel Right Left  Radial Palpable Palpable  PT  Palpable  Palpable  DP Palpable  Palpable  Gastrointestinal: soft, non-distended.  No guarding/no peritoneal signs.  Musculoskeletal: M/S 5/5 throughout.  No visible deformity.  Neurologic: CN 2-12 intact. Pain and light touch intact in extremities.  Symmetrical.  Speech is fluent. Motor exam as listed above. Psychiatric: Judgment intact, Mood & affect appropriate for pt's clinical situation. Dermatologic: No rashes or ulcers noted.  No changes consistent with cellulitis.   CBC Lab Results  Component Value Date   WBC 5.6 05/05/2023   HGB 9.2 (L) 05/05/2023   HCT 28.5 (L) 05/05/2023   MCV 88.2 05/05/2023   PLT 181 05/05/2023    BMET    Component Value Date/Time   NA 134 (L) 05/04/2023 1947   NA 134 (L) 09/06/2013 1458   K 2.7 (LL) 05/04/2023 1947   K 3.5 09/06/2013 1458   CL 99 05/04/2023 1947   CL 102 09/06/2013 1458   CO2 23 05/04/2023 1947   CO2 23 09/06/2013 1458   GLUCOSE 115 (H) 05/04/2023 1947   GLUCOSE 167 (H) 09/06/2013 1458   BUN <5 (L) 05/04/2023 1947   BUN 4 (L) 09/06/2013 1458   CREATININE 0.80 05/04/2023 1947   CREATININE 0.49 (L) 09/23/2022 1034   CALCIUM 8.8 (L) 05/04/2023 1947   CALCIUM 8.9 09/06/2013 1458   GFRNONAA >60 05/04/2023 1947   GFRNONAA >60 09/06/2013 1458   GFRAA >60 06/05/2020 1109   GFRAA >60 09/06/2013 1458   Estimated Creatinine Clearance: 107.3 mL/min (by C-G formula based on SCr of 0.8 mg/dL).  COAG Lab Results  Component Value Date   INR 1.5 (H) 05/05/2023   INR 1.5 (H) 04/11/2023   INR 1.6 (H) 04/07/2023    Radiology US ABDOMEN LIMITED RUQ (LIVER/GB)  Result Date: 05/05/2023 CLINICAL DATA:  Right upper quadrant abdominal pain EXAM: ULTRASOUND ABDOMEN LIMITED RIGHT UPPER QUADRANT COMPARISON:  None Available. FINDINGS: Gallbladder: The gallbladder is mildly distended and a small amount of layering sludge is seen within the gallbladder. However, there is no gallbladder wall thickening and no pericholecystic fluid is identified. The sonographic Eulah Pont sign is reportedly NEGATIVE. Common bile duct: Diameter: 4 mm  in proximal diameter Liver: Subtle contour nodularity of the liver is seen in keeping with cirrhosis. Tips shunt is partially visualized within the right hepatic lobe. No focal intrahepatic mass is identified. No intrahepatic biliary ductal dilation. Portal vein is patent on color Doppler imaging with normal direction of blood flow towards the liver. There is antegrade flow within the proximal portion of the shunt and retrograde flow within the left portal vein, expected following shunt placement. Other: No ascites IMPRESSION: 1. Gallbladder sludge without sonographic evidence of acute cholecystitis. 2. Hepatic cirrhosis. 3. Patent portal vein with normal direction of blood flow. There is retrograde flow within the left portal vein, expected following shunt placement. Electronically Signed   By: Helyn Numbers M.D.   On: 05/05/2023 04:03   CT ABDOMEN PELVIS W CONTRAST  Result Date: 05/05/2023 CLINICAL DATA:  Abdominal pain, acute, nonlocalized EXAM: CT ABDOMEN AND PELVIS WITH CONTRAST TECHNIQUE: Multidetector CT imaging of the abdomen and pelvis was performed using the standard protocol following bolus administration of intravenous contrast. RADIATION DOSE REDUCTION: This exam was performed according to the departmental dose-optimization program which includes automated exposure control,  adjustment of the mA and/or kV according to patient size and/or use of iterative reconstruction technique. CONTRAST:  OMNIPAQUE IOHEXOL 300 MG/ML  SOLN COMPARISON:  09/30/2022 FINDINGS: Lower chest: No acute abnormality. Stable cardiomegaly. There is circumferential thickening of the distal esophagus which appears stable since prior examination, poss fleck ting changes of esophagitis. Hepatobiliary: Right portal vein-hepatic vein tips shunt is in place extending from the portal bifurcation to the hepatocaval junction. The shunt is patent. Mild contour nodularity of the liver is seen in keeping with changes of cirrhosis. No  enhancing intrahepatic mass. The gallbladder is distended and there is trace pericholecystic inflammatory stranding which may reflect changes of acute cholecystitis. No intra or extrahepatic biliary ductal dilation. Pancreas: Unremarkable Spleen: Stable mild splenomegaly with the spleen measuring 13.9 cm in greatest dimension. No intrasplenic lesions are seen. Splenic vein is patent. Adrenals/Urinary Tract: Adrenal glands are unremarkable. Kidneys are normal, without renal calculi, focal lesion, or hydronephrosis. Bladder is unremarkable. Stomach/Bowel: The stomach, small bowel, and large bowel are unremarkable. The appendix is not clearly identified and is likely absent. No free intraperitoneal gas or fluid. Vascular/Lymphatic: Superior mesenteric artery stenting has been performed with the luminal diameter of the stented segment measuring 2 mm in diameter with small amount of neointimal hyperplasia noted. The vessel distally remains patent. Celiac axis demonstrates a 50-75% narrowing proximally secondary to mass effect from the median arcuate ligament. Inferior mesenteric artery is widely patent. Mild aortoiliac atherosclerotic calcification. No aortic aneurysm. Reproductive: Prostate is unremarkable. Other: No abdominal wall hernia Musculoskeletal: No acute bone abnormality. IMPRESSION: 1. Distended gallbladder with trace pericholecystic inflammatory stranding which may reflect changes of acute cholecystitis. Correlation with clinical examination and liver enzymes is recommended. If indicated, this would be better assessed with dedicated right upper quadrant sonography. 2. Morphologic changes in keeping with cirrhosis and portal venous hypertension with mild splenomegaly. Patent TIPS shunt. 3. Interval superior mesenteric artery stenting with luminal diameter of 2 mm with small amount of neointimal hyperplasia noted. The vessel distally remains patent. Given findings within the celiac axis below, clinical  correlation for signs and symptoms of chronic mesenteric ischemia is recommended. Endovascular revision of the stented segment may be warranted. 4. 50-75% narrowing of the proximal celiac axis secondary to mass effect from the median arcuate ligament. 5. Circumferential thickening of the distal esophagus, stable since prior examination, possibly reflecting changes of esophagitis. 6. Stable cardiomegaly. Aortic Atherosclerosis (ICD10-I70.0). Electronically Signed   By: Helyn Numbers M.D.   On: 05/05/2023 02:42   CT Head Wo Contrast  Result Date: 05/05/2023 CLINICAL DATA:  Altered mental status. EXAM: CT HEAD WITHOUT CONTRAST TECHNIQUE: Contiguous axial images were obtained from the base of the skull through the vertex without intravenous contrast. RADIATION DOSE REDUCTION: This exam was performed according to the departmental dose-optimization program which includes automated exposure control, adjustment of the mA and/or kV according to patient size and/or use of iterative reconstruction technique. COMPARISON:  Head CT dated 09/30/2022. FINDINGS: Brain: Mild age-related atrophy and chronic microvascular ischemic changes. There is no acute intracranial hemorrhage. No mass effect or midline shift. No extra-axial fluid collection. Vascular: No hyperdense vessel or unexpected calcification. Skull: Normal. Negative for fracture or focal lesion. Sinuses/Orbits: No acute finding. Other: None IMPRESSION: 1. No acute intracranial pathology. 2. Mild age-related atrophy and chronic microvascular ischemic changes. Electronically Signed   By: Elgie Collard M.D.   On: 05/05/2023 02:28   DG Chest 2 View  Result Date: 05/04/2023 CLINICAL DATA:  Chest pain. EXAM:  CHEST - 2 VIEW COMPARISON:  Chest radiograph dated 04/16/2023. FINDINGS: No focal consolidation, pleural effusion, or pneumothorax. Mild cardiomegaly. No acute osseous pathology. IMPRESSION: 1. No active cardiopulmonary disease. 2. Mild cardiomegaly.  Electronically Signed   By: Elgie Collard M.D.   On: 05/04/2023 23:41   DG Swallowing Func-Speech Pathology  Result Date: 04/21/2023 Table formatting from the original result was not included. Modified Barium Swallow Study Patient Details Name: Ethan Wells MRN: [id number] Date of Birth: 1971-06-29 Today's Date: 04/21/2023 HPI/PMH: HPI: Patient is a 52 y.o. male with PMH: recurrent GI bleed with known variceal bleed, ischemic cardiomyopathy, ETOH abuse, polysubstance abuse, HTN, HLD, depression and anxiety. He presented to [facility] ED on 04/07/23 with acute GI bleed. He was admitted previous month for GI bleed and TIPS procedure recommended at that time but patient declined it. In ED, patient was hypotensive, mildly tachycardic. EGD completed 4/23. He was intubated 4/23 for flash pulmonary edema and extubated 4/25. He was reintubated on 4/27 for TIPS procedure but unable to be extubated following procedure. On 4/30 NG was placed and patient was extubated.  Pt has undergone BSE with recommendation for NPO - Clinical Impression: Clinical Impression: Pt presented with functional oropharyngeal swallow with adequate oral control of bolus, reliable laryngeal vestibule closure with no penetration/aspiration, normal pharyngeal stripping with no residue post-swallow, and normal ability to manage swallowing a pill with thin liquid with no aspiration and  transfer of pill through esophagus. Recommend starting a regular diet, thin liquids; meds in liquid. Mental status will be the primary barrier to adequate PO intake. SLP will f/u x1 to ensure functionality during mealtime. D/W RD, RN, MD. Factors that may increase risk of adverse event in presence of aspiration Rubye Oaks & Clearance Coots 2021): Factors that may increase risk of adverse event in presence of aspiration Rubye Oaks & Clearance Coots 2021): Reduced cognitive function Recommendations/Plan: Swallowing Evaluation Recommendations Swallowing Evaluation Recommendations Recommendations:  PO diet PO Diet Recommendation: Regular; Thin liquids (Level 0) Liquid Administration via: Cup; Straw Medication Administration: Whole meds with liquid Supervision: Patient able to self-feed; Set-up assistance for safety Oral care recommendations: Oral care BID (2x/day) Treatment Plan Treatment Plan Treatment recommendations: Therapy as outlined in treatment plan below Follow-up recommendations: No SLP follow up Treatment frequency: Min 1x/week Treatment duration: 1 week Interventions: Diet toleration management by SLP Recommendations Recommendations for follow up therapy are one component of a multi-disciplinary discharge planning process, led by the attending physician.  Recommendations may be updated based on patient status, additional functional criteria and insurance authorization. Assessment: Orofacial Exam: Orofacial Exam Oral Cavity - Dentition: Poor condition; Missing dentition Orofacial Anatomy: WFL Oral Motor/Sensory Function: WFL Anatomy: Anatomy: WFL Boluses Administered: Boluses Administered Boluses Administered: Thin liquids (Level 0); Mildly thick liquids (Level 2, nectar thick); Puree; Solid  Oral Impairment Domain: Oral Impairment Domain Lip Closure: No labial escape Tongue control during bolus hold: Escape to lateral buccal cavity/floor of mouth Bolus preparation/mastication: Timely and efficient chewing and mashing Bolus transport/lingual motion: Brisk tongue motion Oral residue: Trace residue lining oral structures Location of oral residue : Tongue Initiation of pharyngeal swallow : Pyriform sinuses  Pharyngeal Impairment Domain: Pharyngeal Impairment Domain Soft palate elevation: No bolus between soft palate (SP)/pharyngeal wall (PW) Laryngeal elevation: Complete superior movement of thyroid cartilage with complete approximation of arytenoids to epiglottic petiole Anterior hyoid excursion: Complete anterior movement Epiglottic movement: Complete inversion Laryngeal vestibule closure: Complete,  no air/contrast in laryngeal vestibule Pharyngeal stripping wave : Present - complete Pharyngeal contraction (A/P view only): N/A  Pharyngoesophageal segment opening: Complete distension and complete duration, no obstruction of flow Tongue base retraction: No contrast between tongue base and posterior pharyngeal wall (PPW) Pharyngeal residue: Complete pharyngeal clearance Location of pharyngeal residue: N/A  Esophageal Impairment Domain: Esophageal Impairment Domain Esophageal clearance upright position: Complete clearance, esophageal coating Pill: Esophageal Impairment Domain Esophageal clearance upright position: Complete clearance, esophageal coating Penetration/Aspiration Scale Score: Penetration/Aspiration Scale Score 1.  Material does not enter airway: Thin liquids (Level 0); Mildly thick liquids (Level 2, nectar thick); Puree; Solid; Pill Compensatory Strategies: Compensatory Strategies Compensatory strategies: Yes Straw: Effective   General Information: Caregiver present: No  Diet Prior to this Study: NPO   Temperature : Febrile   Respiratory Status: WFL   Supplemental O2: None (Room air)   History of Recent Intubation: Yes  Behavior/Cognition: Alert; Cooperative; Requires cueing Self-Feeding Abilities: Able to self-feed Baseline vocal quality/speech: Hypophonia/low volume Volitional Cough: Able to elicit Volitional Swallow: Able to elicit Exam Limitations: Limited visibility; Excessive movement Goal Planning: Prognosis for improved oropharyngeal function: Good No data recorded No data recorded Patient/Family Stated Goal: HOB reclined No data recorded Pain: Pain Assessment Pain Assessment: No/denies pain Pain Score: 8 Faces Pain Scale: 4 Breathing: 0 Negative Vocalization: 0 Facial Expression: 0 Body Language: 0 Consolability: 0 PAINAD Score: 0 Facial Expression: 0 Body Movements: 0 Muscle Tension: 0 Compliance with ventilator (intubated pts.): N/A Vocalization (extubated pts.): 0 CPOT Total: 0 Pain Location:  abdomen Pain Descriptors / Indicators: Aching; Grimacing; Guarding Pain Intervention(s): Limited activity within patient's tolerance; Monitored during session; Repositioned End of Session: Start Time:SLP Start Time (ACUTE ONLY): 1478 Stop Time: SLP Stop Time (ACUTE ONLY): 2956 Time Calculation:SLP Time Calculation (min) (ACUTE ONLY): 18 min Charges: SLP Evaluations $ SLP Speech Visit: 1 Visit SLP Evaluations $BSS Swallow: 1 Procedure $MBS Swallow: 1 Procedure $Swallowing Treatment: 1 Procedure SLP visit diagnosis: SLP Visit Diagnosis: Dysphagia, unspecified (R13.10) Past Medical History: Past Medical History: Diagnosis Date  CAD (coronary artery disease)   a. 01/2011 Anterior STEMI/Cath/PCI: LM nl, LAD 100d (3.5x8mm Vision BMS placed), LCX 58m, RI 50, RCA min irregs, EF 40% w/ apical, inferoapical HK; b. 10/2022 NSTSEMI/Cath: LM nl, LAD 5p/m, RI 85, LCX 198m, OM1 mild dzs, OM3 100 fills via L->L collats from dLAD, RCA 80p, 183m, RPDA fills via collats from LAD. EF 45-50%-->Med Rx.  Cardiac arrest - ventricular fibrillation   a. In setting of STEMI 01/2011  Chronic pain   Cocaine abuse (HCC)   Depression with anxiety   ETOH abuse   a. 6-12 beers/day  GERD (gastroesophageal reflux disease)   GI bleed   Hemorrhoids   Hyperlipidemia   Hypertension   Ischemic cardiomyopathy   a. 06/2011 Echo: EF 45-50%, No rwma; b. 10/2022 Echo: EF 55-60%, no rwma, nl RV fxn.  Marijuana abuse   Migraines   Tobacco abuse   a. 1/2 ppd x 26 yrs Past Surgical History: Past Surgical History: Procedure Laterality Date  COLONOSCOPY  08/09/2021  Procedure: COLONOSCOPY;  Surgeon: Midge Minium, MD;  Location: [facility] ENDOSCOPY;  Service: Endoscopy;;  CORONARY ANGIOPLASTY WITH STENT PLACEMENT    ESOPHAGEAL BANDING  04/08/2023  Procedure: ESOPHAGEAL BANDING;  Surgeon: Tressia Danas, MD;  Location: [facility] ENDOSCOPY;  Service: Gastroenterology;;  ESOPHAGOGASTRODUODENOSCOPY N/A 05/30/2022  Procedure: ESOPHAGOGASTRODUODENOSCOPY (EGD);  Surgeon: Toney Reil, MD;  Location: [facility] ENDOSCOPY;  Service: Gastroenterology;  Laterality: N/A;  ESOPHAGOGASTRODUODENOSCOPY (EGD) WITH PROPOFOL N/A 03/01/2021  Procedure: ESOPHAGOGASTRODUODENOSCOPY (EGD) WITH PROPOFOL;  Surgeon: Toney Reil, MD;  Location: [facility]ERY [facility];  Service:  Endoscopy;  Laterality: N/A;  ESOPHAGOGASTRODUODENOSCOPY (EGD) WITH PROPOFOL N/A 08/09/2021  Procedure: ESOPHAGOGASTRODUODENOSCOPY (EGD) WITH PROPOFOL;  Surgeon: Midge Minium, MD;  Location: [facility] ENDOSCOPY;  Service: Endoscopy;  Laterality: N/A;  ESOPHAGOGASTRODUODENOSCOPY (EGD) WITH PROPOFOL N/A 03/01/2023  Procedure: ESOPHAGOGASTRODUODENOSCOPY (EGD) WITH PROPOFOL;  Surgeon: Jaynie Collins, DO;  Location: [facility] ENDOSCOPY;  Service: Gastroenterology;  Laterality: N/A;  ESOPHAGOGASTRODUODENOSCOPY (EGD) WITH PROPOFOL N/A 04/08/2023  Procedure: ESOPHAGOGASTRODUODENOSCOPY (EGD) WITH PROPOFOL;  Surgeon: Tressia Danas, MD;  Location: [facility] ENDOSCOPY;  Service: Gastroenterology;  Laterality: N/A;  EVALUATION UNDER ANESTHESIA WITH HEMORRHOIDECTOMY N/A 10/31/2022  Procedure: EXAM UNDER ANESTHESIA WITH SUTURE LIGATION OF TWO BLEEDING PEDICLES;  Surgeon: Henrene Dodge, MD;  Location: [facility] ORS;  Service: General;  Laterality: N/A;  FLEXIBLE SIGMOIDOSCOPY N/A 10/02/2022  Procedure: FLEXIBLE SIGMOIDOSCOPY;  Surgeon: Meryl Dare, MD;  Location: [facility] ENDOSCOPY;  Service: Gastroenterology;  Laterality: N/A;  IR TIPS  04/11/2023  IR US GUIDE VASC ACCESS RIGHT  04/11/2023  IR US GUIDE VASC ACCESS RIGHT  04/11/2023  IR US GUIDE VASC ACCESS RIGHT  04/11/2023  LEFT HEART CATH AND CORONARY ANGIOGRAPHY N/A 11/04/2022  Procedure: LEFT HEART CATH AND CORONARY ANGIOGRAPHY;  Surgeon: Iran Ouch, MD;  Location: [facility] INVASIVE CV LAB;  Service: Cardiovascular;  Laterality: N/A;  RADIOLOGY WITH ANESTHESIA N/A 04/11/2023  Procedure: IR WITH ANESTHESIA;  Surgeon: Oley Balm, MD;  Location: [facility] OR;  Service: Radiology;  Laterality: N/A;  VISCERAL ANGIOGRAPHY  N/A 10/24/2022  Procedure: VISCERAL ANGIOGRAPHY;  Surgeon: Annice Needy, MD;  Location: [facility] INVASIVE CV LAB;  Service: Cardiovascular;  Laterality: N/A; Amanda L. Samson Frederic, MA CCC/SLP Clinical Specialist - Acute Care SLP Acute Rehabilitation Services Office number [phone] Blenda Mounts Laurice 04/21/2023, 9:57 AM  US Abdomen Complete  Result Date: 04/17/2023 CLINICAL DATA:  Abdominal pain. EXAM: ABDOMEN ULTRASOUND COMPLETE COMPARISON:  February 28, 2023. FINDINGS: Gallbladder: No gallstones or wall thickening visualized. No sonographic Murphy sign noted by sonographer. Mild amount of sludge is noted. Common bile duct:Not visualized. Liver: Slightly nodular hepatic margins are noted suggesting cirrhosis. No definite focal sonographic abnormality is noted. Portal vein is patent on color Doppler imaging with normal direction of blood flow towards the liver. IVC: No abnormality visualized. Pancreas: Not visualized due to overlying bowel gas. Spleen: Size and appearance within normal limits. Right Kidney: Length: 11.5 cm. Echogenicity within normal limits. No mass or hydronephrosis visualized. Left Kidney: Length: 11.3 cm. Echogenicity within normal limits. No mass or hydronephrosis visualized. Abdominal aorta: Not visualized. Other findings: None. IMPRESSION: Slightly nodular hepatic margins are noted suggesting hepatic cirrhosis. Common bile duct, pancreas and abdominal aorta were not visualized on this exam. Small amount of sludge noted within gallbladder. Electronically Signed   By: Lupita Raider M.D.   On: 04/17/2023 13:01   DG Chest Port 1 View  Result Date: 04/17/2023 CLINICAL DATA:  Abdominal pain and hypoxia.  [id number].  [id number]. EXAM: PORTABLE CHEST 1 VIEW PORTABLE SUPINE ABDOMEN, 2 FILMS COMPARISON:  Abdomen film earlier today at 11:04 a.m., portable chest 04/13/2023 FINDINGS: Portable chest at 11:31 p.m.: Prior NGT has been exchanged for a Dobbhoff feeding tube, which terminates in the gastric antrum in  good position. Interstitial and faint patchy hazy opacities are noted in the lower lung fields but significantly improved. The upper lung fields have cleared. There is no substantial pleural effusion. Findings most likely indicating clearing edema and/or pneumonia. The heart is enlarged. There is mild central vascular fullness with improvement. Mediastinum is normally outlined. Interval removal prior ETT, left IJ  central line. Portable supine abdomen, 11:35 p.m., 2 films: Again noted is a TIPS stent in the liver. There is aeration of the intestinal tract without pathologic dilatation with colonic gas through to the rectum. No pathologic calcifications or acute radiographic findings are seen and no supine evidence of free air. There are degenerative changes of the spine. No significant osseous findings. IMPRESSION: 1. Dobbhoff feeding tube terminates in the distal gastric antrum in good position. 2. Interstitial and faint patchy hazy opacities in the lower lung fields but significantly improved. Findings most likely indicating clearing edema and/or pneumonia. 3. Cardiomegaly. 4. Aeration throughout the intestinal tract, without pathologic dilatation. 5. TIPS stent in the liver. Electronically Signed   By: Almira Bar M.D.   On: 04/17/2023 00:07   DG Abd 1 View  Result Date: 04/17/2023 CLINICAL DATA:  Abdominal pain and hypoxia.  [id number].  [id number]. EXAM: PORTABLE CHEST 1 VIEW PORTABLE SUPINE ABDOMEN, 2 FILMS COMPARISON:  Abdomen film earlier today at 11:04 a.m., portable chest 04/13/2023 FINDINGS: Portable chest at 11:31 p.m.: Prior NGT has been exchanged for a Dobbhoff feeding tube, which terminates in the gastric antrum in good position. Interstitial and faint patchy hazy opacities are noted in the lower lung fields but significantly improved. The upper lung fields have cleared. There is no substantial pleural effusion. Findings most likely indicating clearing edema and/or pneumonia. The heart is enlarged. There  is mild central vascular fullness with improvement. Mediastinum is normally outlined. Interval removal prior ETT, left IJ central line. Portable supine abdomen, 11:35 p.m., 2 films: Again noted is a TIPS stent in the liver. There is aeration of the intestinal tract without pathologic dilatation with colonic gas through to the rectum. No pathologic calcifications or acute radiographic findings are seen and no supine evidence of free air. There are degenerative changes of the spine. No significant osseous findings. IMPRESSION: 1. Dobbhoff feeding tube terminates in the distal gastric antrum in good position. 2. Interstitial and faint patchy hazy opacities in the lower lung fields but significantly improved. Findings most likely indicating clearing edema and/or pneumonia. 3. Cardiomegaly. 4. Aeration throughout the intestinal tract, without pathologic dilatation. 5. TIPS stent in the liver. Electronically Signed   By: Almira Bar M.D.   On: 04/17/2023 00:07   DG Abd Portable 1V  Result Date: 04/16/2023 CLINICAL DATA:  Feeding tube placement EXAM: PORTABLE ABDOMEN - 1 VIEW limited for tube placement COMPARISON:  X-ray 04/15/2023 FINDINGS: Prior NG tube has been removed. New Dobbhoff tube with tip crossing to the right side of the abdomen, likely at the pylorus or proximal duodenum. Minimal slack in the stomach. Elsewhere note is made of a presumed tips shunt overlying the liver. Gaseous distention of bowel elsewhere in the visualized abdomen IMPRESSION: Limited x-ray for tube placement has a Dobbhoff tube with tip to the right of the abdomen. Likely pylorus or proximal duodenum Electronically Signed   By: Karen Kays M.D.   On: 04/16/2023 11:30   DG Abd Portable 1V  Result Date: 04/15/2023 CLINICAL DATA:  Nasogastric tube placement EXAM: PORTABLE ABDOMEN - 1 VIEW COMPARISON:  Portable exam 1415 hours compared to 04/12/2023 FINDINGS: 2 nasogastric tubes are identified, projecting over mid stomach and distal  stomach. Air-filled nondistended colon. TIPS shunt RIGHT upper quadrant. Minimal atelectasis or infiltrate LEFT base. IMPRESSION: Two nasogastric tubes project over stomach as above. Electronically Signed   By: Ulyses Southward M.D.   On: 04/15/2023 14:30   DG CHEST PORT 1 VIEW  Result Date: 04/13/2023  CLINICAL DATA:  Fever. EXAM: PORTABLE CHEST 1 VIEW COMPARISON:  April 12, 2023 FINDINGS: Endotracheal tube in satisfactory position. Enteric catheter collimated off the image. Left-sided central venous catheter terminates at the cavoatrial juncture. Enlarged heart. Diffuse bilateral hazy and nodular opacities throughout both lungs. Osseous structures are without acute abnormality. Soft tissues are grossly normal. IMPRESSION: 1. Diffuse bilateral hazy and nodular opacities throughout both lungs. No significant change from the prior radiograph. 2. Enlarged heart. 3. Support apparatus as described above. Electronically Signed   By: Ted Mcalpine M.D.   On: 04/13/2023 15:32   IR Tips  Result Date: 04/12/2023 CLINICAL DATA:  Hematemesis.  Upper GI bleed. Briefly, 52 year old male with history significant for EtOH cirrhosis and esophageal varices s/p EGD with banding 03/01/2023 and plan for interval TIPS. Patient represented to ER with upper GI bleed requiring urgent EGD and repeat banding 04/08/2023. MELD = 16 (04/11/2023). EXAM: Procedures: 1. TRANSJUGULAR INTRAHEPATIC PORTOSYSTEMIC SHUNT (TIPS) 2. INTRACARDIAC ECHOCARDIOGRAPHY (ICE) 3. DIRECT (PERCUTANEOUS) PORTAL VENOUS ACCESS Performing Physician: Roanna Banning, MD Assistant(s): Malachy Moan, MD A qualified trainee/resident or advanced practice provider (APP) was not immediately available to assist with this case. MEDICATIONS: As antibiotic prophylaxis, Vancomycin 1 gm IV was ordered pre-procedure and administered intravenously within one hour of incision. ANESTHESIA/SEDATION: Sedation by the Anesthesia Team was performed. Please see anesthesiology log for  details. CONTRAST:  145 mL Omnipaque 300, intravenous FLUOROSCOPY TIME:  Fluoroscopic dose; 1273 mGy COMPLICATIONS: None immediate. PROCEDURE: Informed written consent was obtained from the patient and/or patient's representative after a thorough discussion of the procedural risks, benefits and alternatives. All questions were addressed. Maximal Sterile Barrier Technique was utilized including caps, mask, sterile gowns, sterile gloves, sterile drape, hand hygiene and skin antiseptic. A timeout was performed prior to the initiation of the procedure. Initial ultrasound scanning demonstrates a small amount of ascites within the RIGHT abdomen. An image was saved for documentation purposes. Paracentesis was NOT performed. A preliminary ultrasound of the RIGHT groin was performed and demonstrates a patent RIGHT common femoral vein. A permanent ultrasound image was recorded. Using a combination of fluoroscopy and ultrasound, an access site was determined. A small dermatotomy was made at the planned puncture site. Using ultrasound guidance, access into the RIGHT greater saphenous vein was obtained with visualization of needle entry into the vessel using a standard micropuncture technique. A wire was advanced into the IVC insert all fascial dilation performed. An 8 Fr, 11 cm vascular sheath was placed into the external iliac vein. Through this access site, an 8 Fr AcuNav ICE catheter was advanced with ease under fluoroscopic guidance to the level of the intrahepatic inferior vena cava. A preliminary ultrasound of the RIGHT neck was performed and demonstrates a patent internal jugular vein. A permanent ultrasound image was recorded. Using a combination of fluoroscopy and ultrasound, an access site was determined. A small dermatotomy was made at the planned puncture site. Using ultrasound guidance, access into the RIGHT internal jugular vein was obtained with visualization of needle entry into the vessel using a standard  micropuncture technique. A wire was advanced into the IVC and serial fascial dilation performed. A 10 Fr 35 cm Cook TIPS sheath was placed into the internal jugular vein and advanced to the IVC. The jugular sheath was retracted into the RIGHT atrium and manometry was performed. HEPATIC VENOGRAM: A 5 Fr angled tip catheter was then directed into the RIGHT hepatic vein. Hepatic venogram was performed. These images demonstrated patent variant anatomy venous an accessory, inferior RIGHT  hepatic vein at the level of the caudate. No evidence of the hepatic venous stenosis. The catheter was advanced to a wedge portion of the a patent vein over which the sheath was advanced into the distal hepatic vein. Using ICE ultrasound visualization the catheter as RIGHT hepatic vein as well as the portal anatomy was defined. Under direct ultrasound visualization, the Argon Scorpion X needle was advanced into the central RIGHT portal vein. Despite multiple attempts, the hepatic vein and portal vein could not be placed into a single sonographic plane and there was an adequate purchase for transhepatic access. ULTRASOUND-GUIDED PORTAL VENOUS ACCESS: Evaluation of the RIGHT upper quadrant showed a patent portal vein. Under ultrasound guidance, a 15 cm 21-G needle was inserted into the portal vein. A 0.018 inch Nitinol guidewire was advanced through the needle. Hand injection of contrast confirmed position within the portal system. The track was dilated using a transitional dilator set, a 0.035 inch wire was inserted into the portal vein and over the wire a sheath was inserted. A snare was inserted and advanced to the level of the portal vein. "GUN SIGHT" TIPS SHUNT CREATION: A snare was also advanced through the transjugular sheath into the RIGHT hepatic vein. With meticulous triangulation, an oblique view was obtained which overlaid the snares upon each other and a 15 cm 21-gauge needle was advanced via direct/percutaneous/transhepatic  approach through the snares. A perpendicular view was obtained which demonstrated that the needle was through both snares. A 0.018 inch guidewire was advanced through the needle into the RIGHT hepatic vein, snared, and pulled through the jugular access site. Using a CXI crossing catheter, portal venous access was obtained through the hepatic vein. Hand injection of contrast confirmed position within the portal system. A Glidewire was then advanced into the splenic vein. A 5 Fr marking pigtail catheter was then advanced over the wire into the main portal vein and wire removed. Portal venogram was performed which demonstrated a patent portal vein. The tract was then pre-dilated with 8 mm x 10 mm Athletis balloon. A 8-10 mm by 7 + 2 cm of GORE Viatorr TIPS stent was placed. After placement of the shunt, RIGHT atrial and portal manometry was performed. Completion portal venogram demonstrated a patent TIPS endograft without residual filling of the gastroesophageal varices. The catheters and sheath were removed and manual compression was applied to the RIGHT internal jugular and RIGHT common femoral venous access sites until hemostasis was achieved. The patient was transferred back to the ICU in stable condition. Post-TIPS Mean Pressures (mmHg): Right atrium:23 Portal vein: 30 Portosystemic gradient: 7 FINDINGS: *Access via the RIGHT jugular and greater saphenous veins. *Aborted ICE-guided TIPS attempt secondary to challenging venous anatomy. Accessory RIGHT hepatic vein the caudate without adequate purchase for transhepatic access. *Successful direct portal venous access and TIPS creation, via the accessory RIGHT hepatic vein to the RIGHT portal vein, with placement of a 7 cm GORE Viatorr stent. *Small volume perihepatic ascites. Paracentesis was NOT performed. IMPRESSION: 1. Successful creation of Transjugular Intrahepatic Portosystemic Shunt (TIPS) via 'Gun sight' technique and direct/percutaneous portal venous access,  under ultrasound and fluoroscopy. 2. Reduction of portal hypertension with a completion portosystemic gradient of 7 mm Hg. 3. No residual opacification of gastroesophageal varices. No variceal embolization was required or performed. PLAN: *Lactulose TID and continue to evaluate/monitor for hepatic encephalopathy. *The patient will be enrolled in the VIR portal hypertension follow-up clinic, and will be closely followed per the protocol. Roanna Banning, MD [facility]  [facility] Radiology Electronically Signed   By: Roanna Banning M.D.   On: 04/12/2023 12:33   IR US Guide Vasc Access Right  Result Date: 04/12/2023 CLINICAL DATA:  Hematemesis.  Upper GI bleed. Briefly, 52 year old male with history significant for EtOH cirrhosis and esophageal varices s/p EGD with banding 03/01/2023 and plan for interval TIPS. Patient represented to ER with upper GI bleed requiring urgent EGD and repeat banding 04/08/2023. MELD = 16 (04/11/2023). EXAM: Procedures: 1. TRANSJUGULAR INTRAHEPATIC PORTOSYSTEMIC SHUNT (TIPS) 2. INTRACARDIAC ECHOCARDIOGRAPHY (ICE) 3. DIRECT (PERCUTANEOUS) PORTAL VENOUS ACCESS Performing Physician: Roanna Banning, MD Assistant(s): Malachy Moan, MD A qualified trainee/resident or advanced practice provider (APP) was not immediately available to assist with this case. MEDICATIONS: As antibiotic prophylaxis, Vancomycin 1 gm IV was ordered pre-procedure and administered intravenously within one hour of incision. ANESTHESIA/SEDATION: Sedation by the Anesthesia Team was performed. Please see anesthesiology log for details. CONTRAST:  145 mL Omnipaque 300, intravenous FLUOROSCOPY TIME:  Fluoroscopic dose; 1273 mGy COMPLICATIONS: None immediate. PROCEDURE: Informed written consent was obtained from the patient and/or patient's representative after a thorough discussion of the procedural risks, benefits and alternatives. All questions were addressed. Maximal Sterile Barrier  Technique was utilized including caps, mask, sterile gowns, sterile gloves, sterile drape, hand hygiene and skin antiseptic. A timeout was performed prior to the initiation of the procedure. Initial ultrasound scanning demonstrates a small amount of ascites within the RIGHT abdomen. An image was saved for documentation purposes. Paracentesis was NOT performed. A preliminary ultrasound of the RIGHT groin was performed and demonstrates a patent RIGHT common femoral vein. A permanent ultrasound image was recorded. Using a combination of fluoroscopy and ultrasound, an access site was determined. A small dermatotomy was made at the planned puncture site. Using ultrasound guidance, access into the RIGHT greater saphenous vein was obtained with visualization of needle entry into the vessel using a standard micropuncture technique. A wire was advanced into the IVC insert all fascial dilation performed. An 8 Fr, 11 cm vascular sheath was placed into the external iliac vein. Through this access site, an 8 Fr AcuNav ICE catheter was advanced with ease under fluoroscopic guidance to the level of the intrahepatic inferior vena cava. A preliminary ultrasound of the RIGHT neck was performed and demonstrates a patent internal jugular vein. A permanent ultrasound image was recorded. Using a combination of fluoroscopy and ultrasound, an access site was determined. A small dermatotomy was made at the planned puncture site. Using ultrasound guidance, access into the RIGHT internal jugular vein was obtained with visualization of needle entry into the vessel using a standard micropuncture technique. A wire was advanced into the IVC and serial fascial dilation performed. A 10 Fr 35 cm Cook TIPS sheath was placed into the internal jugular vein and advanced to the IVC. The jugular sheath was retracted into the RIGHT atrium and manometry was performed. HEPATIC VENOGRAM: A 5 Fr angled tip catheter was then directed into the RIGHT hepatic  vein. Hepatic venogram was performed. These images demonstrated patent variant anatomy venous an accessory, inferior RIGHT hepatic vein at the level of the caudate. No evidence of the hepatic venous stenosis. The catheter was advanced to a wedge portion of the a patent vein over which the sheath was advanced into the distal hepatic vein. Using ICE ultrasound visualization the catheter as RIGHT hepatic vein as well as the portal anatomy was defined. Under direct ultrasound visualization, the Argon Scorpion X needle was advanced into the central RIGHT portal vein. Despite multiple attempts, the  hepatic vein and portal vein could not be placed into a single sonographic plane and there was an adequate purchase for transhepatic access. ULTRASOUND-GUIDED PORTAL VENOUS ACCESS: Evaluation of the RIGHT upper quadrant showed a patent portal vein. Under ultrasound guidance, a 15 cm 21-G needle was inserted into the portal vein. A 0.018 inch Nitinol guidewire was advanced through the needle. Hand injection of contrast confirmed position within the portal system. The track was dilated using a transitional dilator set, a 0.035 inch wire was inserted into the portal vein and over the wire a sheath was inserted. A snare was inserted and advanced to the level of the portal vein. "GUN SIGHT" TIPS SHUNT CREATION: A snare was also advanced through the transjugular sheath into the RIGHT hepatic vein. With meticulous triangulation, an oblique view was obtained which overlaid the snares upon each other and a 15 cm 21-gauge needle was advanced via direct/percutaneous/transhepatic approach through the snares. A perpendicular view was obtained which demonstrated that the needle was through both snares. A 0.018 inch guidewire was advanced through the needle into the RIGHT hepatic vein, snared, and pulled through the jugular access site. Using a CXI crossing catheter, portal venous access was obtained through the hepatic vein. Hand injection  of contrast confirmed position within the portal system. A Glidewire was then advanced into the splenic vein. A 5 Fr marking pigtail catheter was then advanced over the wire into the main portal vein and wire removed. Portal venogram was performed which demonstrated a patent portal vein. The tract was then pre-dilated with 8 mm x 10 mm Athletis balloon. A 8-10 mm by 7 + 2 cm of GORE Viatorr TIPS stent was placed. After placement of the shunt, RIGHT atrial and portal manometry was performed. Completion portal venogram demonstrated a patent TIPS endograft without residual filling of the gastroesophageal varices. The catheters and sheath were removed and manual compression was applied to the RIGHT internal jugular and RIGHT common femoral venous access sites until hemostasis was achieved. The patient was transferred back to the ICU in stable condition. Post-TIPS Mean Pressures (mmHg): Right atrium:23 Portal vein: 30 Portosystemic gradient: 7 FINDINGS: *Access via the RIGHT jugular and greater saphenous veins. *Aborted ICE-guided TIPS attempt secondary to challenging venous anatomy. Accessory RIGHT hepatic vein the caudate without adequate purchase for transhepatic access. *Successful direct portal venous access and TIPS creation, via the accessory RIGHT hepatic vein to the RIGHT portal vein, with placement of a 7 cm GORE Viatorr stent. *Small volume perihepatic ascites. Paracentesis was NOT performed. IMPRESSION: 1. Successful creation of Transjugular Intrahepatic Portosystemic Shunt (TIPS) via 'Gun sight' technique and direct/percutaneous portal venous access, under ultrasound and fluoroscopy. 2. Reduction of portal hypertension with a completion portosystemic gradient of 7 mm Hg. 3. No residual opacification of gastroesophageal varices. No variceal embolization was required or performed. PLAN: *Lactulose TID and continue to evaluate/monitor for hepatic encephalopathy. *The patient will be enrolled in the VIR portal  hypertension follow-up clinic, and will be closely followed per the protocol. Roanna Banning, MD [facility] Electronically Signed   By: Roanna Banning M.D.   On: 04/12/2023 12:33   IR US Guide Vasc Access Right  Result Date: 04/12/2023 CLINICAL DATA:  Hematemesis.  Upper GI bleed. Briefly, 52 year old male with history significant for EtOH cirrhosis and esophageal varices s/p EGD with banding 03/01/2023 and plan for interval TIPS. Patient represented to ER with upper GI bleed requiring urgent EGD and repeat banding 04/08/2023. MELD = 16 (04/11/2023). EXAM:  Procedures: 1. TRANSJUGULAR INTRAHEPATIC PORTOSYSTEMIC SHUNT (TIPS) 2. INTRACARDIAC ECHOCARDIOGRAPHY (ICE) 3. DIRECT (PERCUTANEOUS) PORTAL VENOUS ACCESS Performing Physician: Roanna Banning, MD Assistant(s): Malachy Moan, MD A qualified trainee/resident or advanced practice provider (APP) was not immediately available to assist with this case. MEDICATIONS: As antibiotic prophylaxis, Vancomycin 1 gm IV was ordered pre-procedure and administered intravenously within one hour of incision. ANESTHESIA/SEDATION: Sedation by the Anesthesia Team was performed. Please see anesthesiology log for details. CONTRAST:  145 mL Omnipaque 300, intravenous FLUOROSCOPY TIME:  Fluoroscopic dose; 1273 mGy COMPLICATIONS: None immediate. PROCEDURE: Informed written consent was obtained from the patient and/or patient's representative after a thorough discussion of the procedural risks, benefits and alternatives. All questions were addressed. Maximal Sterile Barrier Technique was utilized including caps, mask, sterile gowns, sterile gloves, sterile drape, hand hygiene and skin antiseptic. A timeout was performed prior to the initiation of the procedure. Initial ultrasound scanning demonstrates a small amount of ascites within the RIGHT abdomen. An image was saved for documentation purposes. Paracentesis was NOT performed. A  preliminary ultrasound of the RIGHT groin was performed and demonstrates a patent RIGHT common femoral vein. A permanent ultrasound image was recorded. Using a combination of fluoroscopy and ultrasound, an access site was determined. A small dermatotomy was made at the planned puncture site. Using ultrasound guidance, access into the RIGHT greater saphenous vein was obtained with visualization of needle entry into the vessel using a standard micropuncture technique. A wire was advanced into the IVC insert all fascial dilation performed. An 8 Fr, 11 cm vascular sheath was placed into the external iliac vein. Through this access site, an 8 Fr AcuNav ICE catheter was advanced with ease under fluoroscopic guidance to the level of the intrahepatic inferior vena cava. A preliminary ultrasound of the RIGHT neck was performed and demonstrates a patent internal jugular vein. A permanent ultrasound image was recorded. Using a combination of fluoroscopy and ultrasound, an access site was determined. A small dermatotomy was made at the planned puncture site. Using ultrasound guidance, access into the RIGHT internal jugular vein was obtained with visualization of needle entry into the vessel using a standard micropuncture technique. A wire was advanced into the IVC and serial fascial dilation performed. A 10 Fr 35 cm Cook TIPS sheath was placed into the internal jugular vein and advanced to the IVC. The jugular sheath was retracted into the RIGHT atrium and manometry was performed. HEPATIC VENOGRAM: A 5 Fr angled tip catheter was then directed into the RIGHT hepatic vein. Hepatic venogram was performed. These images demonstrated patent variant anatomy venous an accessory, inferior RIGHT hepatic vein at the level of the caudate. No evidence of the hepatic venous stenosis. The catheter was advanced to a wedge portion of the a patent vein over which the sheath was advanced into the distal hepatic vein. Using ICE ultrasound  visualization the catheter as RIGHT hepatic vein as well as the portal anatomy was defined. Under direct ultrasound visualization, the Argon Scorpion X needle was advanced into the central RIGHT portal vein. Despite multiple attempts, the hepatic vein and portal vein could not be placed into a single sonographic plane and there was an adequate purchase for transhepatic access. ULTRASOUND-GUIDED PORTAL VENOUS ACCESS: Evaluation of the RIGHT upper quadrant showed a patent portal vein. Under ultrasound guidance, a 15 cm 21-G needle was inserted into the portal vein. A 0.018 inch Nitinol guidewire was advanced through the needle. Hand injection of contrast confirmed position within the portal system. The track was dilated using a  transitional dilator set, a 0.035 inch wire was inserted into the portal vein and over the wire a sheath was inserted. A snare was inserted and advanced to the level of the portal vein. "GUN SIGHT" TIPS SHUNT CREATION: A snare was also advanced through the transjugular sheath into the RIGHT hepatic vein. With meticulous triangulation, an oblique view was obtained which overlaid the snares upon each other and a 15 cm 21-gauge needle was advanced via direct/percutaneous/transhepatic approach through the snares. A perpendicular view was obtained which demonstrated that the needle was through both snares. A 0.018 inch guidewire was advanced through the needle into the RIGHT hepatic vein, snared, and pulled through the jugular access site. Using a CXI crossing catheter, portal venous access was obtained through the hepatic vein. Hand injection of contrast confirmed position within the portal system. A Glidewire was then advanced into the splenic vein. A 5 Fr marking pigtail catheter was then advanced over the wire into the main portal vein and wire removed. Portal venogram was performed which demonstrated a patent portal vein. The tract was then pre-dilated with 8 mm x 10 mm Athletis balloon. A 8-10  mm by 7 + 2 cm of GORE Viatorr TIPS stent was placed. After placement of the shunt, RIGHT atrial and portal manometry was performed. Completion portal venogram demonstrated a patent TIPS endograft without residual filling of the gastroesophageal varices. The catheters and sheath were removed and manual compression was applied to the RIGHT internal jugular and RIGHT common femoral venous access sites until hemostasis was achieved. The patient was transferred back to the ICU in stable condition. Post-TIPS Mean Pressures (mmHg): Right atrium:23 Portal vein: 30 Portosystemic gradient: 7 FINDINGS: *Access via the RIGHT jugular and greater saphenous veins. *Aborted ICE-guided TIPS attempt secondary to challenging venous anatomy. Accessory RIGHT hepatic vein the caudate without adequate purchase for transhepatic access. *Successful direct portal venous access and TIPS creation, via the accessory RIGHT hepatic vein to the RIGHT portal vein, with placement of a 7 cm GORE Viatorr stent. *Small volume perihepatic ascites. Paracentesis was NOT performed. IMPRESSION: 1. Successful creation of Transjugular Intrahepatic Portosystemic Shunt (TIPS) via 'Gun sight' technique and direct/percutaneous portal venous access, under ultrasound and fluoroscopy. 2. Reduction of portal hypertension with a completion portosystemic gradient of 7 mm Hg. 3. No residual opacification of gastroesophageal varices. No variceal embolization was required or performed. PLAN: *Lactulose TID and continue to evaluate/monitor for hepatic encephalopathy. *The patient will be enrolled in the VIR portal hypertension follow-up clinic, and will be closely followed per the protocol. Roanna Banning, MD [facility] Electronically Signed   By: Roanna Banning M.D.   On: 04/12/2023 12:33   IR US Guide Vasc Access Right  Result Date: 04/12/2023 CLINICAL DATA:  Hematemesis.  Upper GI bleed. Briefly, 52 year old  male with history significant for EtOH cirrhosis and esophageal varices s/p EGD with banding 03/01/2023 and plan for interval TIPS. Patient represented to ER with upper GI bleed requiring urgent EGD and repeat banding 04/08/2023. MELD = 16 (04/11/2023). EXAM: Procedures: 1. TRANSJUGULAR INTRAHEPATIC PORTOSYSTEMIC SHUNT (TIPS) 2. INTRACARDIAC ECHOCARDIOGRAPHY (ICE) 3. DIRECT (PERCUTANEOUS) PORTAL VENOUS ACCESS Performing Physician: Roanna Banning, MD Assistant(s): Malachy Moan, MD A qualified trainee/resident or advanced practice provider (APP) was not immediately available to assist with this case. MEDICATIONS: As antibiotic prophylaxis, Vancomycin 1 gm IV was ordered pre-procedure and administered intravenously within one hour of incision. ANESTHESIA/SEDATION: Sedation by the Anesthesia Team was performed. Please see anesthesiology log for details. CONTRAST:  145 mL Omnipaque 300, intravenous FLUOROSCOPY TIME:  Fluoroscopic dose; 1273 mGy COMPLICATIONS: None immediate. PROCEDURE: Informed written consent was obtained from the patient and/or patient's representative after a thorough discussion of the procedural risks, benefits and alternatives. All questions were addressed. Maximal Sterile Barrier Technique was utilized including caps, mask, sterile gowns, sterile gloves, sterile drape, hand hygiene and skin antiseptic. A timeout was performed prior to the initiation of the procedure. Initial ultrasound scanning demonstrates a small amount of ascites within the RIGHT abdomen. An image was saved for documentation purposes. Paracentesis was NOT performed. A preliminary ultrasound of the RIGHT groin was performed and demonstrates a patent RIGHT common femoral vein. A permanent ultrasound image was recorded. Using a combination of fluoroscopy and ultrasound, an access site was determined. A small dermatotomy was made at the planned puncture site. Using ultrasound guidance, access into the RIGHT greater saphenous vein  was obtained with visualization of needle entry into the vessel using a standard micropuncture technique. A wire was advanced into the IVC insert all fascial dilation performed. An 8 Fr, 11 cm vascular sheath was placed into the external iliac vein. Through this access site, an 8 Fr AcuNav ICE catheter was advanced with ease under fluoroscopic guidance to the level of the intrahepatic inferior vena cava. A preliminary ultrasound of the RIGHT neck was performed and demonstrates a patent internal jugular vein. A permanent ultrasound image was recorded. Using a combination of fluoroscopy and ultrasound, an access site was determined. A small dermatotomy was made at the planned puncture site. Using ultrasound guidance, access into the RIGHT internal jugular vein was obtained with visualization of needle entry into the vessel using a standard micropuncture technique. A wire was advanced into the IVC and serial fascial dilation performed. A 10 Fr 35 cm Cook TIPS sheath was placed into the internal jugular vein and advanced to the IVC. The jugular sheath was retracted into the RIGHT atrium and manometry was performed. HEPATIC VENOGRAM: A 5 Fr angled tip catheter was then directed into the RIGHT hepatic vein. Hepatic venogram was performed. These images demonstrated patent variant anatomy venous an accessory, inferior RIGHT hepatic vein at the level of the caudate. No evidence of the hepatic venous stenosis. The catheter was advanced to a wedge portion of the a patent vein over which the sheath was advanced into the distal hepatic vein. Using ICE ultrasound visualization the catheter as RIGHT hepatic vein as well as the portal anatomy was defined. Under direct ultrasound visualization, the Argon Scorpion X needle was advanced into the central RIGHT portal vein. Despite multiple attempts, the hepatic vein and portal vein could not be placed into a single sonographic plane and there was an adequate purchase for transhepatic  access. ULTRASOUND-GUIDED PORTAL VENOUS ACCESS: Evaluation of the RIGHT upper quadrant showed a patent portal vein. Under ultrasound guidance, a 15 cm 21-G needle was inserted into the portal vein. A 0.018 inch Nitinol guidewire was advanced through the needle. Hand injection of contrast confirmed position within the portal system. The track was dilated using a transitional dilator set, a 0.035 inch wire was inserted into the portal vein and over the wire a sheath was inserted. A snare was inserted and advanced to the level of the portal vein. "GUN SIGHT" TIPS SHUNT CREATION: A snare was also advanced through the transjugular sheath into the RIGHT hepatic vein. With meticulous triangulation, an oblique view was obtained which overlaid the snares upon each other and a 15 cm 21-gauge needle was advanced via direct/percutaneous/transhepatic  approach through the snares. A perpendicular view was obtained which demonstrated that the needle was through both snares. A 0.018 inch guidewire was advanced through the needle into the RIGHT hepatic vein, snared, and pulled through the jugular access site. Using a CXI crossing catheter, portal venous access was obtained through the hepatic vein. Hand injection of contrast confirmed position within the portal system. A Glidewire was then advanced into the splenic vein. A 5 Fr marking pigtail catheter was then advanced over the wire into the main portal vein and wire removed. Portal venogram was performed which demonstrated a patent portal vein. The tract was then pre-dilated with 8 mm x 10 mm Athletis balloon. A 8-10 mm by 7 + 2 cm of GORE Viatorr TIPS stent was placed. After placement of the shunt, RIGHT atrial and portal manometry was performed. Completion portal venogram demonstrated a patent TIPS endograft without residual filling of the gastroesophageal varices. The catheters and sheath were removed and manual compression was applied to the RIGHT internal jugular and RIGHT  common femoral venous access sites until hemostasis was achieved. The patient was transferred back to the ICU in stable condition. Post-TIPS Mean Pressures (mmHg): Right atrium:23 Portal vein: 30 Portosystemic gradient: 7 FINDINGS: *Access via the RIGHT jugular and greater saphenous veins. *Aborted ICE-guided TIPS attempt secondary to challenging venous anatomy. Accessory RIGHT hepatic vein the caudate without adequate purchase for transhepatic access. *Successful direct portal venous access and TIPS creation, via the accessory RIGHT hepatic vein to the RIGHT portal vein, with placement of a 7 cm GORE Viatorr stent. *Small volume perihepatic ascites. Paracentesis was NOT performed. IMPRESSION: 1. Successful creation of Transjugular Intrahepatic Portosystemic Shunt (TIPS) via 'Gun sight' technique and direct/percutaneous portal venous access, under ultrasound and fluoroscopy. 2. Reduction of portal hypertension with a completion portosystemic gradient of 7 mm Hg. 3. No residual opacification of gastroesophageal varices. No variceal embolization was required or performed. PLAN: *Lactulose TID and continue to evaluate/monitor for hepatic encephalopathy. *The patient will be enrolled in the VIR portal hypertension follow-up clinic, and will be closely followed per the protocol. Roanna Banning, MD [facility] Electronically Signed   By: Roanna Banning M.D.   On: 04/12/2023 12:33   DG Abd Portable 1V  Result Date: 04/12/2023 CLINICAL DATA:  Tube placement EXAM: PORTABLE ABDOMEN - 1 VIEW COMPARISON:  02/28/2023 FINDINGS: Image of the upper abdomen demonstrates N/OG tube tip superimposed with the stomach below the diaphragm. IMPRESSION: N/OG tube in place. Electronically Signed   By: Layla Maw M.D.   On: 04/12/2023 11:06   Portable Chest x-ray  Result Date: 04/12/2023 CLINICAL DATA:  Check endotracheal tube placement EXAM: PORTABLE CHEST 1 VIEW COMPARISON:   04/11/2023 FINDINGS: Left jugular central line is again noted in the mid superior vena cava. Endotracheal tube is seen 3.2 cm above the carina. Cardiac shadow is enlarged but stable. Diffuse increased airspace opacity is noted bilaterally most consistent with worsening edema. No pneumothorax is seen. No bony abnormality is noted. IMPRESSION: Endotracheal tube in satisfactory position. Worsening pulmonary edema. Electronically Signed   By: Alcide Clever M.D.   On: 04/12/2023 03:35   ECHOCARDIOGRAM COMPLETE  Result Date: 04/11/2023    ECHOCARDIOGRAM REPORT   Patient Name:   Ethan Wells Date of Exam: 04/11/2023 Medical Rec #:  [id number]           Height:       65.0 in Accession #:    [id number]  Weight:       197.3 lb Date of Birth:  Aug 28, 1971           BSA:          1.967 m Patient Age:    51 years            BP:           115/68 mmHg Patient Gender: M                   HR:           89 bpm. Exam Location:  Inpatient Procedure: 2D Echo, Cardiac Doppler, Color Doppler and Intracardiac            Opacification Agent Indications:    Other abnormalities of the heart R00.8  History:        Patient has prior history of Echocardiogram examinations. CAD;                 Risk Factors:Hypertension and Dyslipidemia.  Sonographer:    Mike Gip Referring Phys: [id number] JENNIFER C OMOHUNDRO IMPRESSIONS  1. Left ventricular ejection fraction, by estimation, is 55 to 60%. The left ventricle has normal function. The left ventricle has no regional wall motion abnormalities. There is mild left ventricular hypertrophy. Left ventricular diastolic parameters are consistent with Grade II diastolic dysfunction (pseudonormalization).  2. Right ventricular systolic function is normal. The right ventricular size is normal. There is normal pulmonary artery systolic pressure.  3. Anterior leaflet has increased thickening, likely calcification. Mild mitral valve regurgitation.  4. The aortic valve is tricuspid. Aortic valve  regurgitation is not visualized.  5. The inferior vena cava is normal in size with greater than 50% respiratory variability, suggesting right atrial pressure of 3 mmHg. FINDINGS  Left Ventricle: Left ventricular ejection fraction, by estimation, is 55 to 60%. The left ventricle has normal function. The left ventricle has no regional wall motion abnormalities. Definity contrast agent was given IV to delineate the left ventricular  endocardial borders. The left ventricular internal cavity size was normal in size. There is mild left ventricular hypertrophy. Left ventricular diastolic parameters are consistent with Grade II diastolic dysfunction (pseudonormalization). Right Ventricle: The right ventricular size is normal. Right ventricular systolic function is normal. There is normal pulmonary artery systolic pressure. The tricuspid regurgitant velocity is 2.54 m/s, and with an assumed right atrial pressure of 3 mmHg,  the estimated right ventricular systolic pressure is 28.8 mmHg. Left Atrium: Left atrial size was normal in size. Right Atrium: Right atrial size was normal in size. Pericardium: There is no evidence of pericardial effusion. Mitral Valve: Anterior leaflet has increased thickening, likely calcification. Mild mitral valve regurgitation. Tricuspid Valve: Tricuspid valve regurgitation is mild. Aortic Valve: The aortic valve is tricuspid. Aortic valve regurgitation is not visualized. Pulmonic Valve: Pulmonic valve regurgitation is not visualized. Aorta: The aortic root and ascending aorta are structurally normal, with no evidence of dilitation. Venous: The inferior vena cava is normal in size with greater than 50% respiratory variability, suggesting right atrial pressure of 3 mmHg. IAS/Shunts: No atrial level shunt detected by color flow Doppler.  LEFT VENTRICLE PLAX 2D LVIDd:         5.80 cm      Diastology LVIDs:         4.60 cm      LV e' medial:    4.35 cm/s LV PW:         1.10 cm  LV E/e' medial:   22.0 LV IVS:        1.00 cm      LV e' lateral:   8.59 cm/s LVOT diam:     2.20 cm      LV E/e' lateral: 11.1 LV SV:         87 LV SV Index:   44 LVOT Area:     3.80 cm  LV Volumes (MOD) LV vol d, MOD A2C: 156.0 ml LV vol d, MOD A4C: 149.0 ml LV vol s, MOD A2C: 69.6 ml LV vol s, MOD A4C: 68.0 ml LV SV MOD A2C:     86.4 ml LV SV MOD A4C:     149.0 ml LV SV MOD BP:      87.6 ml RIGHT VENTRICLE             IVC RV Basal diam:  4.20 cm     IVC diam: 1.80 cm RV S prime:     12.30 cm/s TAPSE (M-mode): 2.0 cm LEFT ATRIUM             Index        RIGHT ATRIUM           Index LA diam:        3.90 cm 1.98 cm/m   RA Area:     20.40 cm LA Vol (A2C):   66.0 ml 33.56 ml/m  RA Volume:   55.90 ml  28.42 ml/m LA Vol (A4C):   59.3 ml 30.15 ml/m LA Biplane Vol: 62.5 ml 31.78 ml/m  AORTIC VALVE LVOT Vmax:   102.00 cm/s LVOT Vmean:  68.700 cm/s LVOT VTI:    0.229 m  AORTA Ao Root diam: 3.30 cm Ao Asc diam:  3.10 cm MITRAL VALVE               TRICUSPID VALVE MV Area (PHT): 4.60 cm    TR Peak grad:   25.8 mmHg MV Decel Time: 165 msec    TR Vmax:        254.00 cm/s MV E velocity: 95.50 cm/s MV A velocity: 32.60 cm/s  SHUNTS MV E/A ratio:  2.93        Systemic VTI:  0.23 m                            Systemic Diam: 2.20 cm Carolan Clines Electronically signed by Carolan Clines Signature Date/Time: 04/11/2023/10:06:14 AM    Final    DG Chest Port 1 View  Result Date: 04/11/2023 CLINICAL DATA:  5626 Acute respiratory failure 5626 EXAM: PORTABLE CHEST 1 VIEW COMPARISON:  Chest XR, 04/09/2023.  CT AP, 01/17/2023. FINDINGS: Support lines: Interval extubation. LEFT IJ CVC with catheter tip well-positioned at the superior cavoatrial junction. Cardiomegaly. Perihilar and central bronchial wall thickening. Minimal bibasilar interstitial opacities. No pleural effusion or pneumothorax. No interval osseous abnormality. IMPRESSION: 1. Extubation.  Stable, well-positioned LEFT IJ-approach CVC. 2. Cardiomegaly with perihilar thickening and minimal  basilar opacities. Findings favored to represent mild pulmonary edema and atelectasis. Electronically Signed   By: Roanna Banning M.D.   On: 04/11/2023 07:30   DG Chest Port 1 View  Result Date: 04/09/2023 CLINICAL DATA:  Hypoxia EXAM: PORTABLE CHEST 1 VIEW COMPARISON:  04/09/2023 FINDINGS: Endotracheal tube is again noted and stable. Left jugular central line are again seen in satisfactory position. Significant improvement in the degree of congestive failure is noted. Some residual basilar opacities are seen.  No bony abnormality is noted. IMPRESSION: Near complete resolution of previously seen pulmonary edema. Electronically Signed   By: Alcide Clever M.D.   On: 04/09/2023 12:12   DG CHEST PORT 1 VIEW  Result Date: 04/09/2023 CLINICAL DATA:  Respiratory failure, prone repositioning EXAM: PORTABLE CHEST 1 VIEW COMPARISON:  04/08/2023 FINDINGS: Endotracheal tube is seen 14 mm above the carina. Left internal jugular central venous catheter tip noted overlying the expected superior vena cava. Pulmonary insufflation is stable. Superimposed extensive bilateral perihilar and lower lung zone consolidation in keeping with severe alveolar pulmonary edema or diffuse pneumonic infiltrate has progressed in the interval since prior examination. No pneumothorax or pleural effusion. Cardiac size within normal limits. IMPRESSION: 1. Endotracheal tube 14 mm above the carina. 2. Progressive bilateral perihilar and lower lung zone consolidation, severe alveolar pulmonary edema or diffuse pneumonic infiltrate. Electronically Signed   By: Helyn Numbers M.D.   On: 04/09/2023 03:01   DG CHEST PORT 1 VIEW  Result Date: 04/08/2023 CLINICAL DATA:  Hypoxia EXAM: PORTABLE CHEST 1 VIEW COMPARISON:  04/07/2023 FINDINGS: The endotracheal tube has been placed with tip measuring 3.8 cm above the carina. Left central venous catheter with tip over the low SVC region. No pneumothorax. Cardiac enlargement with bilateral perihilar infiltrates,  progressing since prior study, likely edema. No pleural effusions. No pneumothorax. Mediastinal contours appear intact. IMPRESSION: 1. Appliances appear in satisfactory position. 2. Cardiac enlargement with perihilar infiltration increasing since prior study, likely edema. Electronically Signed   By: Burman Nieves M.D.   On: 04/08/2023 22:04   DG Chest Port 1 View  Result Date: 04/07/2023 CLINICAL DATA:  Hematemesis and abdominal pain EXAM: PORTABLE CHEST 1 VIEW COMPARISON:  01/18/2023 FINDINGS: Unchanged cardiomegaly. No new focal pulmonary opacity. Chronic coarsened interstitial markings without overt pulmonary edema. No pleural effusion or pneumothorax. No acute osseous abnormality. IMPRESSION: Unchanged cardiomegaly. No acute cardiopulmonary process. Electronically Signed   By: Wiliam Ke M.D.   On: 04/07/2023 11:27     Assessment/Plan 1.  Mesenteric stenosis:  This evening the patient's primary complaint appears to be pain in the lower back and legs.  This is certainly unrelated to his mesenteric vasculature.  He does note some abdominal pain but given his advanced hepatic disease and portal hypertension it is difficult to attribute all of his abdominal complaints to his mesenteric vasculature.  Nevertheless the patient does have significant in-stent restenosis and persistent stenosis of the celiac artery.   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  Given that the patient has had frequent complaints of epigastric pain and discomfort, this is certainly a possible cause of the patient's ongoing pain and discomfort.    Given these findings I do think the patient should undergo angiography with the possibility of reintervention the SMA and possible intervention in the celiac.  However, I do not believe this is emergent nor do I believe that the vast majority of his symptoms are attributable to these findings.  Hopefully as  his other issues are attended to he will improve and potentially Friday we could plan on angiography.  2.  Back and bilateral lower extremity pain: This is a bit of a mystery to me it appears to be his primary complaint at the time of my interview yet he does not have a significant history of back issues.  I defer further workup to the primary service as this does not appear to be related to his vascular  insufficiency.  3.  Cirrhosis and portal hypertension: Patient's recent TIPS procedure is patent appears to be working.  Although he did describe 1 bloody bowel movement he is certainly not presenting as a cirrhotic with hemorrhage from esophageal varices.  At this point I would defer further workup to the primary service in association with the GI service.  Levora Dredge, MD  05/05/2023 6:53 PM

## 2023-05-06 ENCOUNTER — Inpatient Hospital Stay: Payer: Medicare HMO

## 2023-05-06 DIAGNOSIS — K922 Gastrointestinal hemorrhage, unspecified: Secondary | ICD-10-CM | POA: Diagnosis not present

## 2023-05-06 LAB — COMPREHENSIVE METABOLIC PANEL
ALT: 22 U/L (ref 0–44)
AST: 53 U/L — ABNORMAL HIGH (ref 15–41)
Albumin: 2.3 g/dL — ABNORMAL LOW (ref 3.5–5.0)
Alkaline Phosphatase: 143 U/L — ABNORMAL HIGH (ref 38–126)
Anion gap: 5 (ref 5–15)
BUN: <5 mg/dL — ABNORMAL LOW (ref 6–20)
CO2: 21 mmol/L — ABNORMAL LOW (ref 22–32)
Calcium: 8.3 mg/dL — ABNORMAL LOW (ref 8.9–10.3)
Chloride: 113 mmol/L — ABNORMAL HIGH (ref 98–111)
Creatinine, Ser: 0.78 mg/dL (ref 0.61–1.24)
GFR, Estimated: >60 mL/min (ref >60)
Glucose, Bld: 137 mg/dL — ABNORMAL HIGH (ref 70–99)
Potassium: 3.9 mmol/L (ref 3.5–5.1)
Sodium: 139 mmol/L (ref 135–145)
Total Bilirubin: 2.5 mg/dL — ABNORMAL HIGH (ref 0.3–1.2)
Total Protein: 6.2 g/dL — ABNORMAL LOW (ref 6.5–8.1)

## 2023-05-06 LAB — GLUCOSE, CAPILLARY
Glucose-Capillary: 105 mg/dL — ABNORMAL HIGH (ref 70–99)
Glucose-Capillary: 131 mg/dL — ABNORMAL HIGH (ref 70–99)
Glucose-Capillary: 163 mg/dL — ABNORMAL HIGH (ref 70–99)

## 2023-05-06 LAB — CBC
HCT: 29.1 % — ABNORMAL LOW (ref 39.0–52.0)
HCT: 30.6 % — ABNORMAL LOW (ref 39.0–52.0)
Hemoglobin: 9.3 g/dL — ABNORMAL LOW (ref 13.0–17.0)
Hemoglobin: 9.6 g/dL — ABNORMAL LOW (ref 13.0–17.0)
MCH: 28.5 pg (ref 26.0–34.0)
MCH: 28.2 pg (ref 26.0–34.0)
MCHC: 32 g/dL (ref 30.0–36.0)
MCHC: 31.4 g/dL (ref 30.0–36.0)
MCV: 89.3 fL (ref 80.0–100.0)
MCV: 90 fL (ref 80.0–100.0)
Platelets: 170 10*3/uL (ref 150–400)
Platelets: 166 10*3/uL (ref 150–400)
RBC: 3.26 MIL/uL — ABNORMAL LOW (ref 4.22–5.81)
RBC: 3.4 MIL/uL — ABNORMAL LOW (ref 4.22–5.81)
RDW: 18.4 % — ABNORMAL HIGH (ref 11.5–15.5)
RDW: 18.6 % — ABNORMAL HIGH (ref 11.5–15.5)
WBC: 4.9 10*3/uL (ref 4.0–10.5)
WBC: 4.7 10*3/uL (ref 4.0–10.5)
nRBC: 0 % (ref 0.0–0.2)
nRBC: 0 % (ref 0.0–0.2)

## 2023-05-06 LAB — MAGNESIUM: Magnesium: 2.1 mg/dL (ref 1.7–2.4)

## 2023-05-06 MED ORDER — METHOCARBAMOL 500 MG PO TABS
500.0000 mg | ORAL_TABLET | Freq: Three times a day (TID) | ORAL | Status: DC
  Administered 2023-05-06 – 2023-05-07 (×4): 500 mg via ORAL
  Filled 2023-05-06 (×4): qty 1

## 2023-05-06 MED ORDER — PANTOPRAZOLE SODIUM 40 MG IV SOLR
40.0000 mg | INTRAVENOUS | Status: DC
  Administered 2023-05-07: 40 mg via INTRAVENOUS
  Filled 2023-05-06: qty 10

## 2023-05-06 NOTE — Progress Notes (Signed)
PROGRESS NOTE    Ethan Wells  ZOX:[id number] DOB: 1971-11-17 DOA: 05/05/2023 PCP: [name], NP    Brief Narrative:  52 y.o. male with medical history significant of alcohol abuse, alcoholic liver cirrhosis with portal gastropathy, s/p of TIPS, superior mesenteric artery stenosis, HTN, HLd, CAD, STEMI, dCHF, GERD, depression with anxiety, adrenal insufficiency (Addison's disease), migraine headache, cardiac arrest due to V-fib, recent admission due to GI bleeding, cocaine abuse, tobacco abuse, who presents with chest pain, abdominal pain, rectal bleeding.   Patient was recently hospitalized from 4/22 - 5/13 due to upper GI bleeding. Pt is s/p of EGD with banding on 4/23 and s/p of TIPS procedure on 4/27. Pt states that he continues to use cocaine and drinking alcohol.  He states that he has chest pain since yesterday, which is located in front and right side of chest, intermittent, come and goes, morbid, both sharp and dull pain, nonradiating.  Associated with dry cough and mild shortness breath.  He also reports abdominal pain, which is diffuse, constant, aching, moderate, nonradiating.    Seen in consultation by vascular surgery and gastroenterology.  Gastroenterology not planning any endoluminal evaluation.  Vascular surgery is recommending angiography with possible reintervention of SMA and celiac.  Nonemergent.  Tentatively planned for Friday 5/23  Patient is significantly lethargic but continues to request IV narcotics.  High suspicion for drug-seeking behavior.   Assessment & Plan:   Principal Problem:   GI bleeding Active Problems:   Anemia due to GI blood loss   Adrenal insufficiency (Addison's disease) (HCC)   Alcoholic cirrhosis (HCC)   Abdominal pain   Mesenteric artery stenosis (HCC)   Chest pain   CAD (coronary artery disease)   UTI (urinary tract infection)   HLD (hyperlipidemia)   Chronic diastolic CHF (congestive heart failure) (HCC)    Hypomagnesemia   Hypokalemia   Essential hypertension   Depression with anxiety   Tobacco abuse   Cocaine abuse (HCC)   Polysubstance abuse (HCC)   Alcohol abuse  GI bleeding and anemia due to GI blood loss Hemorrhoidal bleeding  Hgb 9.3 on 04/26/23 --. 10.0 --> 9.7 --> 9.2, slowly trending down.  Consulted GI GI evaluated patient.  No plans for endoscopic evaluation currently. Plan: Continue IV PPI for now DC octreotide gtt. Monitor hemoglobin Transfuse as needed less than 7 Anusol twice daily as needed  Adrenal insufficiency (Addison's disease) (HCC) -continue home Cortef 5 mg daily   Alcoholic cirrhosis (HCC):  Status post TIPS INR 1.5, ammonia 59, mental status poor but likely attributed to narcotics Plan: -Lactulose 20 g 3 times daily -Rifaximin -Hold Lasix and spironolactone due to soft blood pressure, restart as appropriate   Abdominal pain and superior mesenteric artery stenosis  Etiology for his abdominal pain is not clear.  Potential differential diagnosis include superior mesenteric artery stenosis, SBP, UTI.  CT scan showed distended gallbladder, but US-RUQ has no evidence of cholecystitis.  Patient does not have fever or leukocytosis.  Does not seem to have acute cholecystitis.  Patient has history of superior mesenteric artery stenosis, CT scan also showed some abnormalities in the SMA.  Vascular consulted Plan: Tentative plan for angiography 5/23    Hx of CAD and chest pain Possibly due to cocaine abuse.  Troponin level 18, 23, 17.   UDS is positive for cocaine.    UTI (urinary tract infection) -IV Rocephin -Urine culture   HLD (hyperlipidemia) -Crestor   Chronic diastolic CHF (congestive heart failure) (HCC)  2D echo  on 04/11/2023 showed EF of 55-60% with grade 2 diastolic dysfunction.  Patient mild plus leg edema, elevated BNP 426, but no oxygen desaturation.  Chest x-ray negative for pulmonary edema.  Does not seem to have acute CHF exacerbation, but  patient had a high risk of developing CHF exacerbation.  Due to soft blood pressure, will not start IV diuretics. -Hold Lasix and spironolactone   Hypomagnesemia and Hypokalemia -Repleted magnesium and potassium   Essential hypertension Blood pressure soft 97/54 -Hold all blood pressure medications and diuretics due to soft blood pressure -Midodrine 10 mg 3 times daily, wean as tolerated   Depression with anxiety -Continue home medications   Polysubstance abuse Cocaine, alcohol, tobacco -Did counseling about importance of quitting substance use -Nicotine patch -CIWA protocol   DVT prophylaxis: SCDs Code Status: Full Family Communication: None Disposition Plan: Status is: Inpatient Remains inpatient appropriate because: Multiple acute issues as above   Level of care: Progressive  Consultants:  GI Vascular  Procedures:  None  Antimicrobials: Rocephin   Subjective: Seen and examined.  Lethargic.  Unable to participate in interview  Objective: Vitals:   05/06/23 0423 05/06/23 0500 05/06/23 0814 05/06/23 1135  BP:   117/74 100/61  Pulse: 78  81 75  Resp: 18  (!) 22 18  Temp:   97.7 F (36.5 C) (!) 97.5 F (36.4 C)  TempSrc:      SpO2:   100% 98%  Weight:  82.3 kg    Height:        Intake/Output Summary (Last 24 hours) at 05/06/2023 1314 Last data filed at 05/06/2023 1024 Gross per 24 hour  Intake 1015.83 ml  Output 800 ml  Net 215.83 ml   Filed Weights   05/05/23 2306 05/06/23 0300 05/06/23 0500  Weight: 82.2 kg 82.3 kg 82.3 kg    Examination:  General exam: Lethargic. Respiratory system: Bibasilar crackles.  Normal work of breathing.  Room air Cardiovascular system: S1-S2, RRR, no murmurs, no pedal edema Gastrointestinal system: Soft, mildly distended, normal bowel sounds Central nervous system: Alert but lethargic.  Unable to Extremities: Unable to assess power.  Gait not assessed Skin: No rashes, lesions or ulcers Psychiatry: Unable to  assess    Data Reviewed: I have personally reviewed following labs and imaging studies  CBC: Recent Labs  Lab 05/05/23 0956 05/05/23 1313 05/05/23 2016 05/06/23 0152 05/06/23 0811  WBC 5.3 5.6 5.5 4.9 4.7  HGB 9.7* 9.2* 9.6* 9.3* 9.6*  HCT 30.0* 28.5* 30.5* 29.1* 30.6*  MCV 89.3 88.2 90.0 89.3 90.0  PLT 180 181 203 170 166   Basic Metabolic Panel: Recent Labs  Lab 05/04/23 1947 05/05/23 0131 05/05/23 2310 05/06/23 0558  NA 134*  --   --  139  K 2.7*  --  4.4 3.9  CL 99  --   --  113*  CO2 23  --   --  21*  GLUCOSE 115*  --   --  137*  BUN <5*  --   --  <5*  CREATININE 0.80  --   --  0.78  CALCIUM 8.8*  --   --  8.3*  MG  --  1.5*  --  2.1  PHOS  --  2.6  --   --    GFR: Estimated Creatinine Clearance: 109.1 mL/min (by C-G formula based on SCr of 0.78 mg/dL). Liver Function Tests: Recent Labs  Lab 05/04/23 1947 05/06/23 0558  AST 51* 53*  ALT 24 22  ALKPHOS 161* 143*  BILITOT 3.6* 2.5*  PROT 7.8 6.2*  ALBUMIN 2.9* 2.3*   Recent Labs  Lab 05/04/23 1947  LIPASE 31   Recent Labs  Lab 05/05/23 0132  AMMONIA 59*   Coagulation Profile: Recent Labs  Lab 05/05/23 0132  INR 1.5*   Cardiac Enzymes: No results for input(s): "CKTOTAL", "CKMB", "CKMBINDEX", "TROPONINI" in the last 168 hours. BNP (last 3 results) No results for input(s): "PROBNP" in the last 8760 hours. HbA1C: Recent Labs    05/04/23 1947  HGBA1C 5.2   CBG: Recent Labs  Lab 05/06/23 0815 05/06/23 1137  GLUCAP 105* 131*   Lipid Profile: Recent Labs    05/05/23 0131  CHOL 135  HDL 15*  LDLCALC 100*  TRIG 98  CHOLHDL 9.0   Thyroid Function Tests: No results for input(s): "TSH", "T4TOTAL", "FREET4", "T3FREE", "THYROIDAB" in the last 72 hours. Anemia Panel: No results for input(s): "VITAMINB12", "FOLATE", "FERRITIN", "TIBC", "IRON", "RETICCTPCT" in the last 72 hours. Sepsis Labs: Recent Labs  Lab 05/05/23 0956  LATICACIDVEN 1.2    Recent Results (from the past 240  hour(s))  Urine Culture     Status: None (Preliminary result)   Collection Time: 05/05/23  1:31 AM   Specimen: Urine, Clean Catch  Result Value Ref Range Status   Specimen Description   Final    URINE, CLEAN CATCH Performed at [facility], [address], [place], [place] [address]    Special Requests   Final    NONE Reflexed from [phone] Performed at [facility], [address] [place], [place] [address]    Culture PENDING  Incomplete   Report Status PENDING  Incomplete         Radiology Studies: DG Lumbar Spine 2-3 Views  Result Date: 05/06/2023 CLINICAL DATA:  Low back pain. EXAM: LUMBAR SPINE - 2-3 VIEW COMPARISON:  CT abdomen pelvis-05/05/2023 FINDINGS: There are 5 non rib-bearing lumbar type vertebral bodies. Normal alignment of the lumbar spine. No anterolisthesis or retrolisthesis. Lumbar vertebral body heights appear preserved. Mild to moderate multilevel lumbar spine DDD, worse at L3-L4 and L4-L5 with disc space height loss, endplate irregularity and sclerosis. Limited visualization of the bilateral SI joints is normal. TIPS stent overlies the right upper abdominal quadrant. Vascular stent overlies the expected location of the SMA IMPRESSION: Mild-to-moderate multilevel lumbar spine DDD, worse at L3-L4 and L4-L5. Electronically Signed   By: Simonne Come M.D.   On: 05/06/2023 10:04   US ABDOMEN LIMITED RUQ (LIVER/GB)  Result Date: 05/05/2023 CLINICAL DATA:  Right upper quadrant abdominal pain EXAM: ULTRASOUND ABDOMEN LIMITED RIGHT UPPER QUADRANT COMPARISON:  None Available. FINDINGS: Gallbladder: The gallbladder is mildly distended and a small amount of layering sludge is seen within the gallbladder. However, there is no gallbladder wall thickening and no pericholecystic fluid is identified. The sonographic Eulah Pont sign is reportedly NEGATIVE. Common bile duct: Diameter: 4 mm in proximal diameter Liver: Subtle contour nodularity of the liver is seen in keeping with  cirrhosis. Tips shunt is partially visualized within the right hepatic lobe. No focal intrahepatic mass is identified. No intrahepatic biliary ductal dilation. Portal vein is patent on color Doppler imaging with normal direction of blood flow towards the liver. There is antegrade flow within the proximal portion of the shunt and retrograde flow within the left portal vein, expected following shunt placement. Other: No ascites IMPRESSION: 1. Gallbladder sludge without sonographic evidence of acute cholecystitis. 2. Hepatic cirrhosis. 3. Patent portal vein with normal direction of blood flow. There is  retrograde flow within the left portal vein, expected following shunt placement. Electronically Signed   By: Helyn Numbers M.D.   On: 05/05/2023 04:03   CT ABDOMEN PELVIS W CONTRAST  Result Date: 05/05/2023 CLINICAL DATA:  Abdominal pain, acute, nonlocalized EXAM: CT ABDOMEN AND PELVIS WITH CONTRAST TECHNIQUE: Multidetector CT imaging of the abdomen and pelvis was performed using the standard protocol following bolus administration of intravenous contrast. RADIATION DOSE REDUCTION: This exam was performed according to the departmental dose-optimization program which includes automated exposure control, adjustment of the mA and/or kV according to patient size and/or use of iterative reconstruction technique. CONTRAST:  OMNIPAQUE IOHEXOL 300 MG/ML  SOLN COMPARISON:  09/30/2022 FINDINGS: Lower chest: No acute abnormality. Stable cardiomegaly. There is circumferential thickening of the distal esophagus which appears stable since prior examination, poss fleck ting changes of esophagitis. Hepatobiliary: Right portal vein-hepatic vein tips shunt is in place extending from the portal bifurcation to the hepatocaval junction. The shunt is patent. Mild contour nodularity of the liver is seen in keeping with changes of cirrhosis. No enhancing intrahepatic mass. The gallbladder is distended and there is trace  pericholecystic inflammatory stranding which may reflect changes of acute cholecystitis. No intra or extrahepatic biliary ductal dilation. Pancreas: Unremarkable Spleen: Stable mild splenomegaly with the spleen measuring 13.9 cm in greatest dimension. No intrasplenic lesions are seen. Splenic vein is patent. Adrenals/Urinary Tract: Adrenal glands are unremarkable. Kidneys are normal, without renal calculi, focal lesion, or hydronephrosis. Bladder is unremarkable. Stomach/Bowel: The stomach, small bowel, and large bowel are unremarkable. The appendix is not clearly identified and is likely absent. No free intraperitoneal gas or fluid. Vascular/Lymphatic: Superior mesenteric artery stenting has been performed with the luminal diameter of the stented segment measuring 2 mm in diameter with small amount of neointimal hyperplasia noted. The vessel distally remains patent. Celiac axis demonstrates a 50-75% narrowing proximally secondary to mass effect from the median arcuate ligament. Inferior mesenteric artery is widely patent. Mild aortoiliac atherosclerotic calcification. No aortic aneurysm. Reproductive: Prostate is unremarkable. Other: No abdominal wall hernia Musculoskeletal: No acute bone abnormality. IMPRESSION: 1. Distended gallbladder with trace pericholecystic inflammatory stranding which may reflect changes of acute cholecystitis. Correlation with clinical examination and liver enzymes is recommended. If indicated, this would be better assessed with dedicated right upper quadrant sonography. 2. Morphologic changes in keeping with cirrhosis and portal venous hypertension with mild splenomegaly. Patent TIPS shunt. 3. Interval superior mesenteric artery stenting with luminal diameter of 2 mm with small amount of neointimal hyperplasia noted. The vessel distally remains patent. Given findings within the celiac axis below, clinical correlation for signs and symptoms of chronic mesenteric ischemia is recommended.  Endovascular revision of the stented segment may be warranted. 4. 50-75% narrowing of the proximal celiac axis secondary to mass effect from the median arcuate ligament. 5. Circumferential thickening of the distal esophagus, stable since prior examination, possibly reflecting changes of esophagitis. 6. Stable cardiomegaly. Aortic Atherosclerosis (ICD10-I70.0). Electronically Signed   By: Helyn Numbers M.D.   On: 05/05/2023 02:42   CT Head Wo Contrast  Result Date: 05/05/2023 CLINICAL DATA:  Altered mental status. EXAM: CT HEAD WITHOUT CONTRAST TECHNIQUE: Contiguous axial images were obtained from the base of the skull through the vertex without intravenous contrast. RADIATION DOSE REDUCTION: This exam was performed according to the departmental dose-optimization program which includes automated exposure control, adjustment of the mA and/or kV according to patient size and/or use of iterative reconstruction technique. COMPARISON:  Head CT dated 09/30/2022. FINDINGS: Brain: Mild  age-related atrophy and chronic microvascular ischemic changes. There is no acute intracranial hemorrhage. No mass effect or midline shift. No extra-axial fluid collection. Vascular: No hyperdense vessel or unexpected calcification. Skull: Normal. Negative for fracture or focal lesion. Sinuses/Orbits: No acute finding. Other: None IMPRESSION: 1. No acute intracranial pathology. 2. Mild age-related atrophy and chronic microvascular ischemic changes. Electronically Signed   By: Elgie Collard M.D.   On: 05/05/2023 02:28   DG Chest 2 View  Result Date: 05/04/2023 CLINICAL DATA:  Chest pain. EXAM: CHEST - 2 VIEW COMPARISON:  Chest radiograph dated 04/16/2023. FINDINGS: No focal consolidation, pleural effusion, or pneumothorax. Mild cardiomegaly. No acute osseous pathology. IMPRESSION: 1. No active cardiopulmonary disease. 2. Mild cardiomegaly. Electronically Signed   By: Elgie Collard M.D.   On: 05/04/2023 23:41         Scheduled Meds:  folic acid  1 mg Oral Daily   gabapentin  300 mg Oral BID   hydrocortisone  10 mg Oral Daily   lactulose  20 g Oral TID   LORazepam  0-4 mg Intravenous Q6H   Followed by   Melene Muller ON 05/07/2023] LORazepam  0-4 mg Intravenous Q12H   methocarbamol  500 mg Oral TID   midodrine  10 mg Oral TID WC   multivitamin with minerals  1 tablet Oral Daily   nicotine  21 mg Transdermal Daily   [START ON 05/07/2023] pantoprazole (PROTONIX) IV  40 mg Intravenous Q24H   QUEtiapine  50 mg Oral QHS   rifaximin  550 mg Oral BID   rosuvastatin  40 mg Oral QHS   thiamine  100 mg Oral Daily   Or   thiamine  100 mg Intravenous Daily   Continuous Infusions:  cefTRIAXone (ROCEPHIN)  IV Stopped (05/06/23 1610)     LOS: 1 day     Tresa Moore, MD [facility]   If 7PM-7AM, please contact night-coverage  05/06/2023, 1:14 PM

## 2023-05-06 NOTE — Progress Notes (Signed)
NSG 0700-1900: At start of shift, pt requesting IV morphine, does not want PO pain medication.   Pt assessed, noted to be lethargic, responds to voice, but falls right back to sleep.  Pt had to be awakened numerous times to complete assessment and med administration.  MD at the bedside, morphine not to be given at this time due to lethargy.  Bed alarm on, call bell within reach.  WCTM

## 2023-05-07 ENCOUNTER — Inpatient Hospital Stay: Payer: Medicare HMO

## 2023-05-07 DIAGNOSIS — K7460 Unspecified cirrhosis of liver: Secondary | ICD-10-CM | POA: Diagnosis not present

## 2023-05-07 DIAGNOSIS — M549 Dorsalgia, unspecified: Secondary | ICD-10-CM | POA: Diagnosis not present

## 2023-05-07 DIAGNOSIS — K922 Gastrointestinal hemorrhage, unspecified: Secondary | ICD-10-CM | POA: Diagnosis not present

## 2023-05-07 DIAGNOSIS — K766 Portal hypertension: Secondary | ICD-10-CM | POA: Diagnosis not present

## 2023-05-07 LAB — BASIC METABOLIC PANEL
Anion gap: 7 (ref 5–15)
BUN: <5 mg/dL — ABNORMAL LOW (ref 6–20)
CO2: 21 mmol/L — ABNORMAL LOW (ref 22–32)
Calcium: 8 mg/dL — ABNORMAL LOW (ref 8.9–10.3)
Chloride: 111 mmol/L (ref 98–111)
Creatinine, Ser: 0.7 mg/dL (ref 0.61–1.24)
GFR, Estimated: >60 mL/min (ref >60)
Glucose, Bld: 134 mg/dL — ABNORMAL HIGH (ref 70–99)
Potassium: 3.3 mmol/L — ABNORMAL LOW (ref 3.5–5.1)
Sodium: 139 mmol/L (ref 135–145)

## 2023-05-07 LAB — CBC WITH DIFFERENTIAL/PLATELET
Abs Immature Granulocytes: 0.02 10*3/uL (ref 0.00–0.07)
Basophils Absolute: 0 10*3/uL (ref 0.0–0.1)
Basophils Relative: 1 %
Eosinophils Absolute: 0.2 10*3/uL (ref 0.0–0.5)
Eosinophils Relative: 3 %
HCT: 29 % — ABNORMAL LOW (ref 39.0–52.0)
Hemoglobin: 9.1 g/dL — ABNORMAL LOW (ref 13.0–17.0)
Immature Granulocytes: 0 %
Lymphocytes Relative: 29 %
Lymphs Abs: 1.5 10*3/uL (ref 0.7–4.0)
MCH: 28.4 pg (ref 26.0–34.0)
MCHC: 31.4 g/dL (ref 30.0–36.0)
MCV: 90.6 fL (ref 80.0–100.0)
Monocytes Absolute: 0.6 10*3/uL (ref 0.1–1.0)
Monocytes Relative: 11 %
Neutro Abs: 2.8 10*3/uL (ref 1.7–7.7)
Neutrophils Relative %: 56 %
Platelets: 142 10*3/uL — ABNORMAL LOW (ref 150–400)
RBC: 3.2 MIL/uL — ABNORMAL LOW (ref 4.22–5.81)
RDW: 18.3 % — ABNORMAL HIGH (ref 11.5–15.5)
WBC: 5.1 10*3/uL (ref 4.0–10.5)
nRBC: 0 % (ref 0.0–0.2)

## 2023-05-07 LAB — URINE CULTURE: Culture: >=100000 — AB

## 2023-05-07 LAB — GLUCOSE, CAPILLARY: Glucose-Capillary: 126 mg/dL — ABNORMAL HIGH (ref 70–99)

## 2023-05-07 MED ORDER — PANTOPRAZOLE SODIUM 40 MG PO TBEC
40.0000 mg | DELAYED_RELEASE_TABLET | Freq: Every day | ORAL | Status: DC
  Administered 2023-05-08 – 2023-05-13 (×5): 40 mg via ORAL
  Filled 2023-05-07 (×5): qty 1

## 2023-05-07 MED ORDER — LIDOCAINE 5 % EX PTCH
1.0000 | MEDICATED_PATCH | CUTANEOUS | Status: DC
  Administered 2023-05-07 – 2023-05-12 (×5): 1 via TRANSDERMAL
  Filled 2023-05-07 (×6): qty 1

## 2023-05-07 MED ORDER — GABAPENTIN 100 MG PO CAPS
100.0000 mg | ORAL_CAPSULE | Freq: Two times a day (BID) | ORAL | Status: DC
  Administered 2023-05-07 – 2023-05-13 (×11): 100 mg via ORAL
  Filled 2023-05-07 (×11): qty 1

## 2023-05-07 MED ORDER — LACTULOSE 10 GM/15ML PO SOLN
30.0000 g | Freq: Three times a day (TID) | ORAL | Status: DC
  Administered 2023-05-09 – 2023-05-13 (×9): 30 g via ORAL
  Filled 2023-05-07 (×15): qty 60

## 2023-05-07 NOTE — Progress Notes (Signed)
       CROSS COVER NOTE  NAME: Ethan Wells MRN: [id number] DOB : 1970-12-26    HPI/Events of Note   Report:low back pain chronic  On review of chart: HPI reviewed    Assessment and  Interventions   Assessment:  Plan: Lidocaine patch ordered for lower back pain       .Donnie Mesa NP [facility] Cross Cover 7pm-7am - check amion for availability Pager [phone]

## 2023-05-07 NOTE — H&P (View-Only) (Signed)
                                                                     MRN : [id number]  Ethan Wells is a 52 y.o. (01/18/1971) male who presents with chief complaint of check circulation.  History of Present Illness:   Patient continues to report abdominal pain.  There does not appear to be significant bleeding.  He continues to describe the abdominal pain, as diffuse, constant, aching, moderate to severe and nonradiating.   He is clearly much more alert today than he was in the ER on admission.  Current Meds  Medication Sig   folic acid (FOLVITE) 1 MG tablet Take 1 tablet (1 mg total) by mouth daily.   furosemide (LASIX) 20 MG tablet Take 1 tablet (20 mg total) by mouth daily.   gabapentin (NEURONTIN) 300 MG capsule Take 1 capsule (300 mg total) by mouth 2 (two) times daily.   hydrocortisone (CORTEF) 10 MG tablet Take 1 tablet (10 mg total) by mouth daily.   hydrocortisone (CORTEF) 10 MG tablet Take 1/2 tablet (5 mg total) by mouth daily after lunch.   lactulose (CHRONULAC) 10 GM/15ML solution Take 30 mLs (20 g total) by mouth 3 (three) times daily. Titrate to 1-2 BM per day   midodrine (PROAMATINE) 5 MG tablet Take 1 tablet (5 mg total) by mouth 3 (three) times daily. Skip the dose if Systolic BP greater than 110 mmHg and or HR greater than 100   Multiple Vitamin (MULTIVITAMIN WITH MINERALS) TABS tablet Take 1 tablet by mouth daily.   nitroGLYCERIN (NITROSTAT) 0.4 MG SL tablet Place 1 tablet (0.4 mg total) under the tongue every 5 (five) minutes as needed for chest pain.   pantoprazole (PROTONIX) 40 MG tablet Take 1 tablet (40 mg total) by mouth 2 (two) times daily.   potassium chloride SA (KLOR-CON M) 20 MEQ tablet Take 1 tablet (20 mEq total) by mouth daily.   QUEtiapine (SEROQUEL) 50 MG tablet Take 1 tablet (50 mg total) by mouth at bedtime.   rifaximin (XIFAXAN) 550 MG TABS tablet Take 1 tablet (550 mg total) by mouth 2 (two) times daily.   rosuvastatin (CRESTOR) 40 MG  tablet Take 1 tablet (40 mg total) by mouth daily.   spironolactone (ALDACTONE) 25 MG tablet Take 0.5 tablets (12.5 mg total) by mouth daily.   thiamine (VITAMIN B1) 100 MG tablet Take 1 tablet (100 mg total) by mouth daily.    Past Medical History:  Diagnosis Date   CAD (coronary artery disease)    a. 01/2011 Anterior STEMI/Cath/PCI: LM nl, LAD 100d (3.5x50mm Vision BMS placed), LCX 20m, RI 50, RCA min irregs, EF 40% w/ apical, inferoapical HK; b. 10/2022 NSTSEMI/Cath: LM nl, LAD 5p/m, RI 85, LCX 100m, OM1 mild dzs, OM3 100 fills via L->L collats from dLAD, RCA 80p, 100m, RPDA fills via collats from LAD. EF 45-50%-->Med Rx.   Cardiac arrest - ventricular fibrillation    a. In setting of STEMI 01/2011   Chronic pain    Cocaine abuse (HCC)    Depression with anxiety    ETOH abuse    a. 6-12 beers/day   GERD (gastroesophageal reflux disease)    GI bleed    Hemorrhoids      Hyperlipidemia    Hypertension    Ischemic cardiomyopathy    a. 06/2011 Echo: EF 45-50%, No rwma; b. 10/2022 Echo: EF 55-60%, no rwma, nl RV fxn.   Marijuana abuse    Migraines    Tobacco abuse    a. 1/2 ppd x 26 yrs    Past Surgical History:  Procedure Laterality Date   COLONOSCOPY  08/09/2021   Procedure: COLONOSCOPY;  Surgeon: Wohl, Darren, MD;  Location: [facility];  Service: Endoscopy;;   CORONARY ANGIOPLASTY WITH STENT PLACEMENT     ESOPHAGEAL BANDING  04/08/2023   Procedure: ESOPHAGEAL BANDING;  Surgeon: Beavers, Kimberly, MD;  Location: [facility];  Service: Gastroenterology;;   ESOPHAGOGASTRODUODENOSCOPY N/A 05/30/2022   Procedure: ESOPHAGOGASTRODUODENOSCOPY (EGD);  Surgeon: Vanga, Rohini Reddy, MD;  Location: [facility];  Service: Gastroenterology;  Laterality: N/A;   ESOPHAGOGASTRODUODENOSCOPY (EGD) WITH PROPOFOL N/A 03/01/2021   Procedure: ESOPHAGOGASTRODUODENOSCOPY (EGD) WITH PROPOFOL;  Surgeon: Vanga, Rohini Reddy, MD;  Location: [facility];  Service: Endoscopy;  Laterality: N/A;    ESOPHAGOGASTRODUODENOSCOPY (EGD) WITH PROPOFOL N/A 08/09/2021   Procedure: ESOPHAGOGASTRODUODENOSCOPY (EGD) WITH PROPOFOL;  Surgeon: Wohl, Darren, MD;  Location: [facility];  Service: Endoscopy;  Laterality: N/A;   ESOPHAGOGASTRODUODENOSCOPY (EGD) WITH PROPOFOL N/A 03/01/2023   Procedure: ESOPHAGOGASTRODUODENOSCOPY (EGD) WITH PROPOFOL;  Surgeon: Russo, Steven Michael, DO;  Location: [facility];  Service: Gastroenterology;  Laterality: N/A;   ESOPHAGOGASTRODUODENOSCOPY (EGD) WITH PROPOFOL N/A 04/08/2023   Procedure: ESOPHAGOGASTRODUODENOSCOPY (EGD) WITH PROPOFOL;  Surgeon: Beavers, Kimberly, MD;  Location: [facility];  Service: Gastroenterology;  Laterality: N/A;   EVALUATION UNDER ANESTHESIA WITH HEMORRHOIDECTOMY N/A 10/31/2022   Procedure: EXAM UNDER ANESTHESIA WITH SUTURE LIGATION OF TWO BLEEDING PEDICLES;  Surgeon: Piscoya, Jose, MD;  Location: [facility];  Service: General;  Laterality: N/A;   FLEXIBLE SIGMOIDOSCOPY N/A 10/02/2022   Procedure: FLEXIBLE SIGMOIDOSCOPY;  Surgeon: Stark, Malcolm T, MD;  Location: [facility];  Service: Gastroenterology;  Laterality: N/A;   IR TIPS  04/11/2023   IR US GUIDE VASC ACCESS RIGHT  04/11/2023   IR US GUIDE VASC ACCESS RIGHT  04/11/2023   IR US GUIDE VASC ACCESS RIGHT  04/11/2023   LEFT HEART CATH AND CORONARY ANGIOGRAPHY N/A 11/04/2022   Procedure: LEFT HEART CATH AND CORONARY ANGIOGRAPHY;  Surgeon: Arida, Muhammad A, MD;  Location: [facility];  Service: Cardiovascular;  Laterality: N/A;   RADIOLOGY WITH ANESTHESIA N/A 04/11/2023   Procedure: IR WITH ANESTHESIA;  Surgeon: Hassell, Daniel, MD;  Location: [facility];  Service: Radiology;  Laterality: N/A;   VISCERAL ANGIOGRAPHY N/A 10/24/2022   Procedure: VISCERAL ANGIOGRAPHY;  Surgeon: Dew, Jason S, MD;  Location: [facility];  Service: Cardiovascular;  Laterality: N/A;    Social History Social History   Tobacco Use   Smoking status: Every Day    Packs/day: 0.50    Years: 41.00     Additional pack years: 0.00    Total pack years: 20.50    Types: Cigarettes   Smokeless tobacco: Never   Tobacco comments:    started age 8  Vaping Use   Vaping Use: Never used  Substance Use Topics   Alcohol use: Yes    Alcohol/week: 12.0 standard drinks of alcohol    Types: 12 Cans of beer per week    Comment: at least a 6 pack of beer daily and often a 12 pack   Drug use: Yes    Types: Cocaine, Marijuana    Family History Family History  Problem Relation Age of Onset     Coronary artery disease Mother        alive   Other Other        2 sisters alive and well   Alcohol abuse Father        died @ 59   Other Paternal Grandmother        PACER    Allergies  Allergen Reactions   Penicillins Hives and Rash    Tolerated Ceftriaxone 10/2022     REVIEW OF SYSTEMS (Negative unless checked)  Constitutional: []Weight loss  []Fever  []Chills Cardiac: []Chest pain   []Chest pressure   []Palpitations   []Shortness of breath when laying flat   []Shortness of breath with exertion. Vascular:  [x]Pain in legs with walking   []Pain in legs at rest  []History of DVT   []Phlebitis   []Swelling in legs   []Varicose veins   []Non-healing ulcers Pulmonary:   []Uses home oxygen   []Productive cough   []Hemoptysis   []Wheeze  []COPD   []Asthma Neurologic:  []Dizziness   []Seizures   []History of stroke   []History of TIA  []Aphasia   []Vissual changes   []Weakness or numbness in arm   []Weakness or numbness in leg Musculoskeletal:   []Joint swelling   []Joint pain   []Low back pain Hematologic:  []Easy bruising  []Easy bleeding   []Hypercoagulable state   []Anemic Gastrointestinal:  []Diarrhea   []Vomiting  []Gastroesophageal reflux/heartburn   []Difficulty swallowing. Genitourinary:  []Chronic kidney disease   []Difficult urination  []Frequent urination   []Blood in urine Skin:  []Rashes   []Ulcers  Psychological:  []History of anxiety   [] History of major depression.  Physical  Examination  Vitals:   05/07/23 0602 05/07/23 0823 05/07/23 1210 05/07/23 1707  BP:  (!) 104/59 (!) 90/49 (!) 93/47  Pulse:  93 91 87  Resp:  18 16 16  Temp:  98.5 F (36.9 C) 98.8 F (37.1 C) 98 F (36.7 C)  TempSrc:  Oral Oral   SpO2:  100% 98% 98%  Weight: 82.6 kg     Height:       Body mass index is 30.3 kg/m. Gen: WD/WN, much more alert mild distress  Head: [place]/AT, No temporalis wasting.  Ear/Nose/Throat: Hearing grossly intact, nares w/o erythema or drainage Eyes: PER, EOMI, sclera nonicteric.  Neck: Supple, no masses.  No bruit or JVD.  Pulmonary:  Good air movement, no audible wheezing, no use of accessory muscles.  Cardiac: RRR, normal S1, S2, no Murmurs. Vascular:   Vessel Right Left  Radial Palpable Palpable  Gastrointestinal: Firm, moderately distended. No guarding/no peritoneal signs.  Musculoskeletal: M/S 4/5 throughout but this seems to be more effort related than true weakness.  No visible deformity.  Neurologic: CN 2-12 intact. Pain and light touch intact in extremities.  Symmetrical.  Speech is fluent. Motor exam as listed above. Psychiatric: Judgment intact, Mood & affect appropriate for pt's clinical situation. Dermatologic: No rashes or ulcers noted.  No changes consistent with cellulitis.   CBC Lab Results  Component Value Date   WBC 5.1 05/07/2023   HGB 9.1 (L) 05/07/2023   HCT 29.0 (L) 05/07/2023   MCV 90.6 05/07/2023   PLT 142 (L) 05/07/2023    BMET    Component Value Date/Time   NA 139 05/07/2023 0519   NA 134 (L) 09/06/2013 1458   K 3.3 (L) 05/07/2023 0519   K 3.5 09/06/2013 1458   CL 111 05/07/2023 0519   CL 102 09/06/2013   1458   CO2 21 (L) 05/07/2023 0519   CO2 23 09/06/2013 1458   GLUCOSE 134 (H) 05/07/2023 0519   GLUCOSE 167 (H) 09/06/2013 1458   BUN <5 (L) 05/07/2023 0519   BUN 4 (L) 09/06/2013 1458   CREATININE 0.70 05/07/2023 0519   CREATININE 0.49 (L) 09/23/2022 1034   CALCIUM 8.0 (L) 05/07/2023 0519   CALCIUM 8.9  09/06/2013 1458   GFRNONAA >60 05/07/2023 0519   GFRNONAA >60 09/06/2013 1458   GFRAA >60 06/05/2020 1109   GFRAA >60 09/06/2013 1458   Estimated Creatinine Clearance: 109.2 mL/min (by C-G formula based on SCr of 0.7 mg/dL).  COAG Lab Results  Component Value Date   INR 1.5 (H) 05/05/2023   INR 1.5 (H) 04/11/2023   INR 1.6 (H) 04/07/2023    Radiology DG Lumbar Spine 2-3 Views  Result Date: 05/06/2023 CLINICAL DATA:  Low back pain. EXAM: LUMBAR SPINE - 2-3 VIEW COMPARISON:  CT abdomen pelvis-05/05/2023 FINDINGS: There are 5 non rib-bearing lumbar type vertebral bodies. Normal alignment of the lumbar spine. No anterolisthesis or retrolisthesis. Lumbar vertebral body heights appear preserved. Mild to moderate multilevel lumbar spine DDD, worse at L3-L4 and L4-L5 with disc space height loss, endplate irregularity and sclerosis. Limited visualization of the bilateral SI joints is normal. TIPS stent overlies the right upper abdominal quadrant. Vascular stent overlies the expected location of the SMA IMPRESSION: Mild-to-moderate multilevel lumbar spine DDD, worse at L3-L4 and L4-L5. Electronically Signed   By: John  Watts M.D.   On: 05/06/2023 10:04   US ABDOMEN LIMITED RUQ (LIVER/GB)  Result Date: 05/05/2023 CLINICAL DATA:  Right upper quadrant abdominal pain EXAM: ULTRASOUND ABDOMEN LIMITED RIGHT UPPER QUADRANT COMPARISON:  None Available. FINDINGS: Gallbladder: The gallbladder is mildly distended and a small amount of layering sludge is seen within the gallbladder. However, there is no gallbladder wall thickening and no pericholecystic fluid is identified. The sonographic Murphy sign is reportedly NEGATIVE. Common bile duct: Diameter: 4 mm in proximal diameter Liver: Subtle contour nodularity of the liver is seen in keeping with cirrhosis. Tips shunt is partially visualized within the right hepatic lobe. No focal intrahepatic mass is identified. No intrahepatic biliary ductal dilation. Portal vein  is patent on color Doppler imaging with normal direction of blood flow towards the liver. There is antegrade flow within the proximal portion of the shunt and retrograde flow within the left portal vein, expected following shunt placement. Other: No ascites IMPRESSION: 1. Gallbladder sludge without sonographic evidence of acute cholecystitis. 2. Hepatic cirrhosis. 3. Patent portal vein with normal direction of blood flow. There is retrograde flow within the left portal vein, expected following shunt placement. Electronically Signed   By: Ashesh  Parikh M.D.   On: 05/05/2023 04:03   CT ABDOMEN PELVIS W CONTRAST  Result Date: 05/05/2023 CLINICAL DATA:  Abdominal pain, acute, nonlocalized EXAM: CT ABDOMEN AND PELVIS WITH CONTRAST TECHNIQUE: Multidetector CT imaging of the abdomen and pelvis was performed using the standard protocol following bolus administration of intravenous contrast. RADIATION DOSE REDUCTION: This exam was performed according to the departmental dose-optimization program which includes automated exposure control, adjustment of the mA and/or kV according to patient size and/or use of iterative reconstruction technique. CONTRAST:  100mL OMNIPAQUE IOHEXOL 300 MG/ML  SOLN COMPARISON:  09/30/2022 FINDINGS: Lower chest: No acute abnormality. Stable cardiomegaly. There is circumferential thickening of the distal esophagus which appears stable since prior examination, poss fleck ting changes of esophagitis. Hepatobiliary: Right portal vein-hepatic vein tips shunt is in place extending   from the portal bifurcation to the hepatocaval junction. The shunt is patent. Mild contour nodularity of the liver is seen in keeping with changes of cirrhosis. No enhancing intrahepatic mass. The gallbladder is distended and there is trace pericholecystic inflammatory stranding which may reflect changes of acute cholecystitis. No intra or extrahepatic biliary ductal dilation. Pancreas: Unremarkable Spleen: Stable mild  splenomegaly with the spleen measuring 13.9 cm in greatest dimension. No intrasplenic lesions are seen. Splenic vein is patent. Adrenals/Urinary Tract: Adrenal glands are unremarkable. Kidneys are normal, without renal calculi, focal lesion, or hydronephrosis. Bladder is unremarkable. Stomach/Bowel: The stomach, small bowel, and large bowel are unremarkable. The appendix is not clearly identified and is likely absent. No free intraperitoneal gas or fluid. Vascular/Lymphatic: Superior mesenteric artery stenting has been performed with the luminal diameter of the stented segment measuring 2 mm in diameter with small amount of neointimal hyperplasia noted. The vessel distally remains patent. Celiac axis demonstrates a 50-75% narrowing proximally secondary to mass effect from the median arcuate ligament. Inferior mesenteric artery is widely patent. Mild aortoiliac atherosclerotic calcification. No aortic aneurysm. Reproductive: Prostate is unremarkable. Other: No abdominal wall hernia Musculoskeletal: No acute bone abnormality. IMPRESSION: 1. Distended gallbladder with trace pericholecystic inflammatory stranding which may reflect changes of acute cholecystitis. Correlation with clinical examination and liver enzymes is recommended. If indicated, this would be better assessed with dedicated right upper quadrant sonography. 2. Morphologic changes in keeping with cirrhosis and portal venous hypertension with mild splenomegaly. Patent TIPS shunt. 3. Interval superior mesenteric artery stenting with luminal diameter of 2 mm with small amount of neointimal hyperplasia noted. The vessel distally remains patent. Given findings within the celiac axis below, clinical correlation for signs and symptoms of chronic mesenteric ischemia is recommended. Endovascular revision of the stented segment may be warranted. 4. 50-75% narrowing of the proximal celiac axis secondary to mass effect from the median arcuate ligament. 5.  Circumferential thickening of the distal esophagus, stable since prior examination, possibly reflecting changes of esophagitis. 6. Stable cardiomegaly. Aortic Atherosclerosis (ICD10-I70.0). Electronically Signed   By: Ashesh  Parikh M.D.   On: 05/05/2023 02:42   CT Head Wo Contrast  Result Date: 05/05/2023 CLINICAL DATA:  Altered mental status. EXAM: CT HEAD WITHOUT CONTRAST TECHNIQUE: Contiguous axial images were obtained from the base of the skull through the vertex without intravenous contrast. RADIATION DOSE REDUCTION: This exam was performed according to the departmental dose-optimization program which includes automated exposure control, adjustment of the mA and/or kV according to patient size and/or use of iterative reconstruction technique. COMPARISON:  Head CT dated 09/30/2022. FINDINGS: Brain: Mild age-related atrophy and chronic microvascular ischemic changes. There is no acute intracranial hemorrhage. No mass effect or midline shift. No extra-axial fluid collection. Vascular: No hyperdense vessel or unexpected calcification. Skull: Normal. Negative for fracture or focal lesion. Sinuses/Orbits: No acute finding. Other: None IMPRESSION: 1. No acute intracranial pathology. 2. Mild age-related atrophy and chronic microvascular ischemic changes. Electronically Signed   By: Arash  Radparvar M.D.   On: 05/05/2023 02:28   DG Chest 2 View  Result Date: 05/04/2023 CLINICAL DATA:  Chest pain. EXAM: CHEST - 2 VIEW COMPARISON:  Chest radiograph dated 04/16/2023. FINDINGS: No focal consolidation, pleural effusion, or pneumothorax. Mild cardiomegaly. No acute osseous pathology. IMPRESSION: 1. No active cardiopulmonary disease. 2. Mild cardiomegaly. Electronically Signed   By: Arash  Radparvar M.D.   On: 05/04/2023 23:41   DG Swallowing Func-Speech Pathology  Result Date: 04/21/2023 Table formatting from the original result was not included. Modified   Barium Swallow Study Patient Details Name: Caetano B  Pressley MRN: [id number] Date of Birth: 12/24/1970 Today's Date: 04/21/2023 HPI/PMH: HPI: Patient is a 51 y.o. male with PMH: recurrent GI bleed with known variceal bleed, ischemic cardiomyopathy, ETOH abuse, polysubstance abuse, HTN, HLD, depression and anxiety. He presented to [facility] ED on 04/07/23 with acute GI bleed. He was admitted previous month for GI bleed and TIPS procedure recommended at that time but patient declined it. In ED, patient was hypotensive, mildly tachycardic. EGD completed 4/23. He was intubated 4/23 for flash pulmonary edema and extubated 4/25. He was reintubated on 4/27 for TIPS procedure but unable to be extubated following procedure. On 4/30 NG was placed and patient was extubated.  Pt has undergone BSE with recommendation for NPO - Clinical Impression: Clinical Impression: Pt presented with functional oropharyngeal swallow with adequate oral control of bolus, reliable laryngeal vestibule closure with no penetration/aspiration, normal pharyngeal stripping with no residue post-swallow, and normal ability to manage swallowing a pill with thin liquid with no aspiration and  transfer of pill through esophagus. Recommend starting a regular diet, thin liquids; meds in liquid. Mental status will be the primary barrier to adequate PO intake. SLP will f/u x1 to ensure functionality during mealtime. D/W RD, RN, MD. Factors that may increase risk of adverse event in presence of aspiration (Palmer & Padilla 2021): Factors that may increase risk of adverse event in presence of aspiration (Palmer & Padilla 2021): Reduced cognitive function Recommendations/Plan: Swallowing Evaluation Recommendations Swallowing Evaluation Recommendations Recommendations: PO diet PO Diet Recommendation: Regular; Thin liquids (Level 0) Liquid Administration via: Cup; Straw Medication Administration: Whole meds with liquid Supervision: Patient able to self-feed; Set-up assistance for safety Oral care recommendations: Oral care  BID (2x/day) Treatment Plan Treatment Plan Treatment recommendations: Therapy as outlined in treatment plan below Follow-up recommendations: No SLP follow up Treatment frequency: Min 1x/week Treatment duration: 1 week Interventions: Diet toleration management by SLP Recommendations Recommendations for follow up therapy are one component of a multi-disciplinary discharge planning process, led by the attending physician.  Recommendations may be updated based on patient status, additional functional criteria and insurance authorization. Assessment: Orofacial Exam: Orofacial Exam Oral Cavity - Dentition: Poor condition; Missing dentition Orofacial Anatomy: WFL Oral Motor/Sensory Function: WFL Anatomy: Anatomy: WFL Boluses Administered: Boluses Administered Boluses Administered: Thin liquids (Level 0); Mildly thick liquids (Level 2, nectar thick); Puree; Solid  Oral Impairment Domain: Oral Impairment Domain Lip Closure: No labial escape Tongue control during bolus hold: Escape to lateral buccal cavity/floor of mouth Bolus preparation/mastication: Timely and efficient chewing and mashing Bolus transport/lingual motion: Brisk tongue motion Oral residue: Trace residue lining oral structures Location of oral residue : Tongue Initiation of pharyngeal swallow : Pyriform sinuses  Pharyngeal Impairment Domain: Pharyngeal Impairment Domain Soft palate elevation: No bolus between soft palate (SP)/pharyngeal wall (PW) Laryngeal elevation: Complete superior movement of thyroid cartilage with complete approximation of arytenoids to epiglottic petiole Anterior hyoid excursion: Complete anterior movement Epiglottic movement: Complete inversion Laryngeal vestibule closure: Complete, no air/contrast in laryngeal vestibule Pharyngeal stripping wave : Present - complete Pharyngeal contraction (A/P view only): N/A Pharyngoesophageal segment opening: Complete distension and complete duration, no obstruction of flow Tongue base retraction: No  contrast between tongue base and posterior pharyngeal wall (PPW) Pharyngeal residue: Complete pharyngeal clearance Location of pharyngeal residue: N/A  Esophageal Impairment Domain: Esophageal Impairment Domain Esophageal clearance upright position: Complete clearance, esophageal coating Pill: Esophageal Impairment Domain Esophageal clearance upright position: Complete clearance, esophageal coating Penetration/Aspiration Scale Score: Penetration/Aspiration Scale   Score 1.  Material does not enter airway: Thin liquids (Level 0); Mildly thick liquids (Level 2, nectar thick); Puree; Solid; Pill Compensatory Strategies: Compensatory Strategies Compensatory strategies: Yes Straw: Effective   General Information: Caregiver present: No  Diet Prior to this Study: NPO   Temperature : Febrile   Respiratory Status: WFL   Supplemental O2: None (Room air)   History of Recent Intubation: Yes  Behavior/Cognition: Alert; Cooperative; Requires cueing Self-Feeding Abilities: Able to self-feed Baseline vocal quality/speech: Hypophonia/low volume Volitional Cough: Able to elicit Volitional Swallow: Able to elicit Exam Limitations: Limited visibility; Excessive movement Goal Planning: Prognosis for improved oropharyngeal function: Good No data recorded No data recorded Patient/Family Stated Goal: HOB reclined No data recorded Pain: Pain Assessment Pain Assessment: No/denies pain Pain Score: 8 Faces Pain Scale: 4 Breathing: 0 Negative Vocalization: 0 Facial Expression: 0 Body Language: 0 Consolability: 0 PAINAD Score: 0 Facial Expression: 0 Body Movements: 0 Muscle Tension: 0 Compliance with ventilator (intubated pts.): N/A Vocalization (extubated pts.): 0 CPOT Total: 0 Pain Location: abdomen Pain Descriptors / Indicators: Aching; Grimacing; Guarding Pain Intervention(s): Limited activity within patient's tolerance; Monitored during session; Repositioned End of Session: Start Time:SLP Start Time (ACUTE ONLY): 0814 Stop Time: SLP Stop Time  (ACUTE ONLY): 0832 Time Calculation:SLP Time Calculation (min) (ACUTE ONLY): 18 min Charges: SLP Evaluations $ SLP Speech Visit: 1 Visit SLP Evaluations $BSS Swallow: 1 Procedure $MBS Swallow: 1 Procedure $Swallowing Treatment: 1 Procedure SLP visit diagnosis: SLP Visit Diagnosis: Dysphagia, unspecified (R13.10) Past Medical History: Past Medical History: Diagnosis Date  CAD (coronary artery disease)   a. 01/2011 Anterior STEMI/Cath/PCI: LM nl, LAD 100d (3.5x50mm Vision BMS placed), LCX 20m, RI 50, RCA min irregs, EF 40% w/ apical, inferoapical HK; b. 10/2022 NSTSEMI/Cath: LM nl, LAD 5p/m, RI 85, LCX 100m, OM1 mild dzs, OM3 100 fills via L->L collats from dLAD, RCA 80p, 100m, RPDA fills via collats from LAD. EF 45-50%-->Med Rx.  Cardiac arrest - ventricular fibrillation   a. In setting of STEMI 01/2011  Chronic pain   Cocaine abuse (HCC)   Depression with anxiety   ETOH abuse   a. 6-12 beers/day  GERD (gastroesophageal reflux disease)   GI bleed   Hemorrhoids   Hyperlipidemia   Hypertension   Ischemic cardiomyopathy   a. 06/2011 Echo: EF 45-50%, No rwma; b. 10/2022 Echo: EF 55-60%, no rwma, nl RV fxn.  Marijuana abuse   Migraines   Tobacco abuse   a. 1/2 ppd x 26 yrs Past Surgical History: Past Surgical History: Procedure Laterality Date  COLONOSCOPY  08/09/2021  Procedure: COLONOSCOPY;  Surgeon: Wohl, Darren, MD;  Location: [facility];  Service: Endoscopy;;  CORONARY ANGIOPLASTY WITH STENT PLACEMENT    ESOPHAGEAL BANDING  04/08/2023  Procedure: ESOPHAGEAL BANDING;  Surgeon: Beavers, Kimberly, MD;  Location: [facility];  Service: Gastroenterology;;  ESOPHAGOGASTRODUODENOSCOPY N/A 05/30/2022  Procedure: ESOPHAGOGASTRODUODENOSCOPY (EGD);  Surgeon: Vanga, Rohini Reddy, MD;  Location: [facility];  Service: Gastroenterology;  Laterality: N/A;  ESOPHAGOGASTRODUODENOSCOPY (EGD) WITH PROPOFOL N/A 03/01/2021  Procedure: ESOPHAGOGASTRODUODENOSCOPY (EGD) WITH PROPOFOL;  Surgeon: Vanga, Rohini Reddy, MD;  Location: [facility];  Service: Endoscopy;  Laterality: N/A;  ESOPHAGOGASTRODUODENOSCOPY (EGD) WITH PROPOFOL N/A 08/09/2021  Procedure: ESOPHAGOGASTRODUODENOSCOPY (EGD) WITH PROPOFOL;  Surgeon: Wohl, Darren, MD;  Location: [facility];  Service: Endoscopy;  Laterality: N/A;  ESOPHAGOGASTRODUODENOSCOPY (EGD) WITH PROPOFOL N/A 03/01/2023  Procedure: ESOPHAGOGASTRODUODENOSCOPY (EGD) WITH PROPOFOL;  Surgeon: Russo, Steven Michael, DO;  Location: [facility];  Service: Gastroenterology;  Laterality: N/A;  ESOPHAGOGASTRODUODENOSCOPY (EGD) WITH PROPOFOL N/A   04/08/2023  Procedure: ESOPHAGOGASTRODUODENOSCOPY (EGD) WITH PROPOFOL;  Surgeon: Beavers, Kimberly, MD;  Location: [facility];  Service: Gastroenterology;  Laterality: N/A;  EVALUATION UNDER ANESTHESIA WITH HEMORRHOIDECTOMY N/A 10/31/2022  Procedure: EXAM UNDER ANESTHESIA WITH SUTURE LIGATION OF TWO BLEEDING PEDICLES;  Surgeon: Piscoya, Jose, MD;  Location: [facility];  Service: General;  Laterality: N/A;  FLEXIBLE SIGMOIDOSCOPY N/A 10/02/2022  Procedure: FLEXIBLE SIGMOIDOSCOPY;  Surgeon: Stark, Malcolm T, MD;  Location: [facility];  Service: Gastroenterology;  Laterality: N/A;  IR TIPS  04/11/2023  IR US GUIDE VASC ACCESS RIGHT  04/11/2023  IR US GUIDE VASC ACCESS RIGHT  04/11/2023  IR US GUIDE VASC ACCESS RIGHT  04/11/2023  LEFT HEART CATH AND CORONARY ANGIOGRAPHY N/A 11/04/2022  Procedure: LEFT HEART CATH AND CORONARY ANGIOGRAPHY;  Surgeon: Arida, Muhammad A, MD;  Location: [facility];  Service: Cardiovascular;  Laterality: N/A;  RADIOLOGY WITH ANESTHESIA N/A 04/11/2023  Procedure: IR WITH ANESTHESIA;  Surgeon: Hassell, Daniel, MD;  Location: [facility];  Service: Radiology;  Laterality: N/A;  VISCERAL ANGIOGRAPHY N/A 10/24/2022  Procedure: VISCERAL ANGIOGRAPHY;  Surgeon: Dew, Jason S, MD;  Location: [facility];  Service: Cardiovascular;  Laterality: N/A; Amanda L. Couture, MA CCC/SLP Clinical Specialist - Acute Care SLP Acute Rehabilitation Services Office  number [phone] Couture, Amanda Laurice 04/21/2023, 9:57 AM  US Abdomen Complete  Result Date: 04/17/2023 CLINICAL DATA:  Abdominal pain. EXAM: ABDOMEN ULTRASOUND COMPLETE COMPARISON:  February 28, 2023. FINDINGS: Gallbladder: No gallstones or wall thickening visualized. No sonographic Murphy sign noted by sonographer. Mild amount of sludge is noted. Common bile duct:Not visualized. Liver: Slightly nodular hepatic margins are noted suggesting cirrhosis. No definite focal sonographic abnormality is noted. Portal vein is patent on color Doppler imaging with normal direction of blood flow towards the liver. IVC: No abnormality visualized. Pancreas: Not visualized due to overlying bowel gas. Spleen: Size and appearance within normal limits. Right Kidney: Length: 11.5 cm. Echogenicity within normal limits. No mass or hydronephrosis visualized. Left Kidney: Length: 11.3 cm. Echogenicity within normal limits. No mass or hydronephrosis visualized. Abdominal aorta: Not visualized. Other findings: None. IMPRESSION: Slightly nodular hepatic margins are noted suggesting hepatic cirrhosis. Common bile duct, pancreas and abdominal aorta were not visualized on this exam. Small amount of sludge noted within gallbladder. Electronically Signed   By: James  Green Jr M.D.   On: 04/17/2023 13:01   DG Chest Port 1 View  Result Date: 04/17/2023 CLINICAL DATA:  Abdominal pain and hypoxia.  [id number].  [id number]. EXAM: PORTABLE CHEST 1 VIEW PORTABLE SUPINE ABDOMEN, 2 FILMS COMPARISON:  Abdomen film earlier today at 11:04 a.m., portable chest 04/13/2023 FINDINGS: Portable chest at 11:31 p.m.: Prior NGT has been exchanged for a Dobbhoff feeding tube, which terminates in the gastric antrum in good position. Interstitial and faint patchy hazy opacities are noted in the lower lung fields but significantly improved. The upper lung fields have cleared. There is no substantial pleural effusion. Findings most likely indicating clearing edema and/or  pneumonia. The heart is enlarged. There is mild central vascular fullness with improvement. Mediastinum is normally outlined. Interval removal prior ETT, left IJ central line. Portable supine abdomen, 11:35 p.m., 2 films: Again noted is a TIPS stent in the liver. There is aeration of the intestinal tract without pathologic dilatation with colonic gas through to the rectum. No pathologic calcifications or acute radiographic findings are seen and no supine evidence of free air. There are degenerative changes of the spine. No significant osseous findings. IMPRESSION: 1. Dobbhoff feeding tube   terminates in the distal gastric antrum in good position. 2. Interstitial and faint patchy hazy opacities in the lower lung fields but significantly improved. Findings most likely indicating clearing edema and/or pneumonia. 3. Cardiomegaly. 4. Aeration throughout the intestinal tract, without pathologic dilatation. 5. TIPS stent in the liver. Electronically Signed   By: Keith  Chesser M.D.   On: 04/17/2023 00:07   DG Abd 1 View  Result Date: 04/17/2023 CLINICAL DATA:  Abdominal pain and hypoxia.  [id number].  [id number]. EXAM: PORTABLE CHEST 1 VIEW PORTABLE SUPINE ABDOMEN, 2 FILMS COMPARISON:  Abdomen film earlier today at 11:04 a.m., portable chest 04/13/2023 FINDINGS: Portable chest at 11:31 p.m.: Prior NGT has been exchanged for a Dobbhoff feeding tube, which terminates in the gastric antrum in good position. Interstitial and faint patchy hazy opacities are noted in the lower lung fields but significantly improved. The upper lung fields have cleared. There is no substantial pleural effusion. Findings most likely indicating clearing edema and/or pneumonia. The heart is enlarged. There is mild central vascular fullness with improvement. Mediastinum is normally outlined. Interval removal prior ETT, left IJ central line. Portable supine abdomen, 11:35 p.m., 2 films: Again noted is a TIPS stent in the liver. There is aeration of the  intestinal tract without pathologic dilatation with colonic gas through to the rectum. No pathologic calcifications or acute radiographic findings are seen and no supine evidence of free air. There are degenerative changes of the spine. No significant osseous findings. IMPRESSION: 1. Dobbhoff feeding tube terminates in the distal gastric antrum in good position. 2. Interstitial and faint patchy hazy opacities in the lower lung fields but significantly improved. Findings most likely indicating clearing edema and/or pneumonia. 3. Cardiomegaly. 4. Aeration throughout the intestinal tract, without pathologic dilatation. 5. TIPS stent in the liver. Electronically Signed   By: Keith  Chesser M.D.   On: 04/17/2023 00:07   DG Abd Portable 1V  Result Date: 04/16/2023 CLINICAL DATA:  Feeding tube placement EXAM: PORTABLE ABDOMEN - 1 VIEW limited for tube placement COMPARISON:  X-ray 04/15/2023 FINDINGS: Prior NG tube has been removed. New Dobbhoff tube with tip crossing to the right side of the abdomen, likely at the pylorus or proximal duodenum. Minimal slack in the stomach. Elsewhere note is made of a presumed tips shunt overlying the liver. Gaseous distention of bowel elsewhere in the visualized abdomen IMPRESSION: Limited x-ray for tube placement has a Dobbhoff tube with tip to the right of the abdomen. Likely pylorus or proximal duodenum Electronically Signed   By: Ashok  Gupta M.D.   On: 04/16/2023 11:30   DG Abd Portable 1V  Result Date: 04/15/2023 CLINICAL DATA:  Nasogastric tube placement EXAM: PORTABLE ABDOMEN - 1 VIEW COMPARISON:  Portable exam 1415 hours compared to 04/12/2023 FINDINGS: 2 nasogastric tubes are identified, projecting over mid stomach and distal stomach. Air-filled nondistended colon. TIPS shunt RIGHT upper quadrant. Minimal atelectasis or infiltrate LEFT base. IMPRESSION: Two nasogastric tubes project over stomach as above. Electronically Signed   By: Mark  Boles M.D.   On: 04/15/2023 14:30    DG CHEST PORT 1 VIEW  Result Date: 04/13/2023 CLINICAL DATA:  Fever. EXAM: PORTABLE CHEST 1 VIEW COMPARISON:  April 12, 2023 FINDINGS: Endotracheal tube in satisfactory position. Enteric catheter collimated off the image. Left-sided central venous catheter terminates at the cavoatrial juncture. Enlarged heart. Diffuse bilateral hazy and nodular opacities throughout both lungs. Osseous structures are without acute abnormality. Soft tissues are grossly normal. IMPRESSION: 1. Diffuse bilateral hazy and nodular opacities throughout both   lungs. No significant change from the prior radiograph. 2. Enlarged heart. 3. Support apparatus as described above. Electronically Signed   By: Dobrinka  Dimitrova M.D.   On: 04/13/2023 15:32   IR Tips  Result Date: 04/12/2023 CLINICAL DATA:  Hematemesis.  Upper GI bleed. Briefly, 51-year-old male with history significant for EtOH cirrhosis and esophageal varices s/p EGD with banding 03/01/2023 and plan for interval TIPS. Patient represented to ER with upper GI bleed requiring urgent EGD and repeat banding 04/08/2023. MELD = 16 (04/11/2023). EXAM: Procedures: 1. TRANSJUGULAR INTRAHEPATIC PORTOSYSTEMIC SHUNT (TIPS) 2. INTRACARDIAC ECHOCARDIOGRAPHY (ICE) 3. DIRECT (PERCUTANEOUS) PORTAL VENOUS ACCESS Performing Physician: Jon Mugweru, MD Assistant(s): Heath McCullough, MD A qualified trainee/resident or advanced practice provider (APP) was not immediately available to assist with this case. MEDICATIONS: As antibiotic prophylaxis, Vancomycin 1 gm IV was ordered pre-procedure and administered intravenously within one hour of incision. ANESTHESIA/SEDATION: Sedation by the Anesthesia Team was performed. Please see anesthesiology log for details. CONTRAST:  145 mL Omnipaque 300, intravenous FLUOROSCOPY TIME:  Fluoroscopic dose; 1273 mGy COMPLICATIONS: None immediate. PROCEDURE: Informed written consent was obtained from the patient and/or patient's representative after a thorough  discussion of the procedural risks, benefits and alternatives. All questions were addressed. Maximal Sterile Barrier Technique was utilized including caps, mask, sterile gowns, sterile gloves, sterile drape, hand hygiene and skin antiseptic. A timeout was performed prior to the initiation of the procedure. Initial ultrasound scanning demonstrates a small amount of ascites within the RIGHT abdomen. An image was saved for documentation purposes. Paracentesis was NOT performed. A preliminary ultrasound of the RIGHT groin was performed and demonstrates a patent RIGHT common femoral vein. A permanent ultrasound image was recorded. Using a combination of fluoroscopy and ultrasound, an access site was determined. A small dermatotomy was made at the planned puncture site. Using ultrasound guidance, access into the RIGHT greater saphenous vein was obtained with visualization of needle entry into the vessel using a standard micropuncture technique. A wire was advanced into the IVC insert all fascial dilation performed. An 8 Fr, 11 cm vascular sheath was placed into the external iliac vein. Through this access site, an 8 Fr AcuNav ICE catheter was advanced with ease under fluoroscopic guidance to the level of the intrahepatic inferior vena cava. A preliminary ultrasound of the RIGHT neck was performed and demonstrates a patent internal jugular vein. A permanent ultrasound image was recorded. Using a combination of fluoroscopy and ultrasound, an access site was determined. A small dermatotomy was made at the planned puncture site. Using ultrasound guidance, access into the RIGHT internal jugular vein was obtained with visualization of needle entry into the vessel using a standard micropuncture technique. A wire was advanced into the IVC and serial fascial dilation performed. A 10 Fr 35 cm Cook TIPS sheath was placed into the internal jugular vein and advanced to the IVC. The jugular sheath was retracted into the RIGHT atrium  and manometry was performed. HEPATIC VENOGRAM: A 5 Fr angled tip catheter was then directed into the RIGHT hepatic vein. Hepatic venogram was performed. These images demonstrated patent variant anatomy venous an accessory, inferior RIGHT hepatic vein at the level of the caudate. No evidence of the hepatic venous stenosis. The catheter was advanced to a wedge portion of the a patent vein over which the sheath was advanced into the distal hepatic vein. Using ICE ultrasound visualization the catheter as RIGHT hepatic vein as well as the portal anatomy was defined. Under direct ultrasound visualization, the Argon Scorpion X needle was   advanced into the central RIGHT portal vein. Despite multiple attempts, the hepatic vein and portal vein could not be placed into a single sonographic plane and there was an adequate purchase for transhepatic access. ULTRASOUND-GUIDED PORTAL VENOUS ACCESS: Evaluation of the RIGHT upper quadrant showed a patent portal vein. Under ultrasound guidance, a 15 cm 21-G needle was inserted into the portal vein. A 0.018 inch Nitinol guidewire was advanced through the needle. Hand injection of contrast confirmed position within the portal system. The track was dilated using a transitional dilator set, a 0.035 inch wire was inserted into the portal vein and over the wire a sheath was inserted. A snare was inserted and advanced to the level of the portal vein. "GUN SIGHT" TIPS SHUNT CREATION: A snare was also advanced through the transjugular sheath into the RIGHT hepatic vein. With meticulous triangulation, an oblique view was obtained which overlaid the snares upon each other and a 15 cm 21-gauge needle was advanced via direct/percutaneous/transhepatic approach through the snares. A perpendicular view was obtained which demonstrated that the needle was through both snares. A 0.018 inch guidewire was advanced through the needle into the RIGHT hepatic vein, snared, and pulled through the jugular  access site. Using a CXI crossing catheter, portal venous access was obtained through the hepatic vein. Hand injection of contrast confirmed position within the portal system. A Glidewire was then advanced into the splenic vein. A 5 Fr marking pigtail catheter was then advanced over the wire into the main portal vein and wire removed. Portal venogram was performed which demonstrated a patent portal vein. The tract was then pre-dilated with 8 mm x 10 mm Athletis balloon. A 8-10 mm by 7 + 2 cm of GORE Viatorr TIPS stent was placed. After placement of the shunt, RIGHT atrial and portal manometry was performed. Completion portal venogram demonstrated a patent TIPS endograft without residual filling of the gastroesophageal varices. The catheters and sheath were removed and manual compression was applied to the RIGHT internal jugular and RIGHT common femoral venous access sites until hemostasis was achieved. The patient was transferred back to the ICU in stable condition. Post-TIPS Mean Pressures (mmHg): Right atrium:23 Portal vein: 30 Portosystemic gradient: 7 FINDINGS: *Access via the RIGHT jugular and greater saphenous veins. *Aborted ICE-guided TIPS attempt secondary to challenging venous anatomy. Accessory RIGHT hepatic vein the caudate without adequate purchase for transhepatic access. *Successful direct portal venous access and TIPS creation, via the accessory RIGHT hepatic vein to the RIGHT portal vein, with placement of a 7 cm GORE Viatorr stent. *Small volume perihepatic ascites. Paracentesis was NOT performed. IMPRESSION: 1. Successful creation of Transjugular Intrahepatic Portosystemic Shunt (TIPS) via 'Gun sight' technique and direct/percutaneous portal venous access, under ultrasound and fluoroscopy. 2. Reduction of portal hypertension with a completion portosystemic gradient of 7 mm Hg. 3. No residual opacification of gastroesophageal varices. No variceal embolization was required or performed. PLAN:  *Lactulose TID and continue to evaluate/monitor for hepatic encephalopathy. *The patient will be enrolled in the [facility] portal hypertension follow-up clinic, and will be closely followed per the protocol. Jon Mugweru, MD [facility] Electronically Signed   By: Jon  Mugweru M.D.   On: 04/12/2023 12:33   IR US Guide Vasc Access Right  Result Date: 04/12/2023 CLINICAL DATA:  Hematemesis.  Upper GI bleed. Briefly, 51-year-old male with history significant for EtOH cirrhosis and esophageal varices s/p EGD with banding 03/01/2023 and plan for interval TIPS. Patient represented to ER with upper GI bleed requiring   urgent EGD and repeat banding 04/08/2023. MELD = 16 (04/11/2023). EXAM: Procedures: 1. TRANSJUGULAR INTRAHEPATIC PORTOSYSTEMIC SHUNT (TIPS) 2. INTRACARDIAC ECHOCARDIOGRAPHY (ICE) 3. DIRECT (PERCUTANEOUS) PORTAL VENOUS ACCESS Performing Physician: Jon Mugweru, MD Assistant(s): Heath McCullough, MD A qualified trainee/resident or advanced practice provider (APP) was not immediately available to assist with this case. MEDICATIONS: As antibiotic prophylaxis, Vancomycin 1 gm IV was ordered pre-procedure and administered intravenously within one hour of incision. ANESTHESIA/SEDATION: Sedation by the Anesthesia Team was performed. Please see anesthesiology log for details. CONTRAST:  145 mL Omnipaque 300, intravenous FLUOROSCOPY TIME:  Fluoroscopic dose; 1273 mGy COMPLICATIONS: None immediate. PROCEDURE: Informed written consent was obtained from the patient and/or patient's representative after a thorough discussion of the procedural risks, benefits and alternatives. All questions were addressed. Maximal Sterile Barrier Technique was utilized including caps, mask, sterile gowns, sterile gloves, sterile drape, hand hygiene and skin antiseptic. A timeout was performed prior to the initiation of the procedure. Initial ultrasound scanning demonstrates a small  amount of ascites within the RIGHT abdomen. An image was saved for documentation purposes. Paracentesis was NOT performed. A preliminary ultrasound of the RIGHT groin was performed and demonstrates a patent RIGHT common femoral vein. A permanent ultrasound image was recorded. Using a combination of fluoroscopy and ultrasound, an access site was determined. A small dermatotomy was made at the planned puncture site. Using ultrasound guidance, access into the RIGHT greater saphenous vein was obtained with visualization of needle entry into the vessel using a standard micropuncture technique. A wire was advanced into the IVC insert all fascial dilation performed. An 8 Fr, 11 cm vascular sheath was placed into the external iliac vein. Through this access site, an 8 Fr AcuNav ICE catheter was advanced with ease under fluoroscopic guidance to the level of the intrahepatic inferior vena cava. A preliminary ultrasound of the RIGHT neck was performed and demonstrates a patent internal jugular vein. A permanent ultrasound image was recorded. Using a combination of fluoroscopy and ultrasound, an access site was determined. A small dermatotomy was made at the planned puncture site. Using ultrasound guidance, access into the RIGHT internal jugular vein was obtained with visualization of needle entry into the vessel using a standard micropuncture technique. A wire was advanced into the IVC and serial fascial dilation performed. A 10 Fr 35 cm Cook TIPS sheath was placed into the internal jugular vein and advanced to the IVC. The jugular sheath was retracted into the RIGHT atrium and manometry was performed. HEPATIC VENOGRAM: A 5 Fr angled tip catheter was then directed into the RIGHT hepatic vein. Hepatic venogram was performed. These images demonstrated patent variant anatomy venous an accessory, inferior RIGHT hepatic vein at the level of the caudate. No evidence of the hepatic venous stenosis. The catheter was advanced to a  wedge portion of the a patent vein over which the sheath was advanced into the distal hepatic vein. Using ICE ultrasound visualization the catheter as RIGHT hepatic vein as well as the portal anatomy was defined. Under direct ultrasound visualization, the Argon Scorpion X needle was advanced into the central RIGHT portal vein. Despite multiple attempts, the hepatic vein and portal vein could not be placed into a single sonographic plane and there was an adequate purchase for transhepatic access. ULTRASOUND-GUIDED PORTAL VENOUS ACCESS: Evaluation of the RIGHT upper quadrant showed a patent portal vein. Under ultrasound guidance, a 15 cm 21-G needle was inserted into the portal vein. A 0.018 inch Nitinol guidewire was advanced through the needle. Hand injection of contrast confirmed   position within the portal system. The track was dilated using a transitional dilator set, a 0.035 inch wire was inserted into the portal vein and over the wire a sheath was inserted. A snare was inserted and advanced to the level of the portal vein. "GUN SIGHT" TIPS SHUNT CREATION: A snare was also advanced through the transjugular sheath into the RIGHT hepatic vein. With meticulous triangulation, an oblique view was obtained which overlaid the snares upon each other and a 15 cm 21-gauge needle was advanced via direct/percutaneous/transhepatic approach through the snares. A perpendicular view was obtained which demonstrated that the needle was through both snares. A 0.018 inch guidewire was advanced through the needle into the RIGHT hepatic vein, snared, and pulled through the jugular access site. Using a CXI crossing catheter, portal venous access was obtained through the hepatic vein. Hand injection of contrast confirmed position within the portal system. A Glidewire was then advanced into the splenic vein. A 5 Fr marking pigtail catheter was then advanced over the wire into the main portal vein and wire removed. Portal venogram was  performed which demonstrated a patent portal vein. The tract was then pre-dilated with 8 mm x 10 mm Athletis balloon. A 8-10 mm by 7 + 2 cm of GORE Viatorr TIPS stent was placed. After placement of the shunt, RIGHT atrial and portal manometry was performed. Completion portal venogram demonstrated a patent TIPS endograft without residual filling of the gastroesophageal varices. The catheters and sheath were removed and manual compression was applied to the RIGHT internal jugular and RIGHT common femoral venous access sites until hemostasis was achieved. The patient was transferred back to the ICU in stable condition. Post-TIPS Mean Pressures (mmHg): Right atrium:23 Portal vein: 30 Portosystemic gradient: 7 FINDINGS: *Access via the RIGHT jugular and greater saphenous veins. *Aborted ICE-guided TIPS attempt secondary to challenging venous anatomy. Accessory RIGHT hepatic vein the caudate without adequate purchase for transhepatic access. *Successful direct portal venous access and TIPS creation, via the accessory RIGHT hepatic vein to the RIGHT portal vein, with placement of a 7 cm GORE Viatorr stent. *Small volume perihepatic ascites. Paracentesis was NOT performed. IMPRESSION: 1. Successful creation of Transjugular Intrahepatic Portosystemic Shunt (TIPS) via 'Gun sight' technique and direct/percutaneous portal venous access, under ultrasound and fluoroscopy. 2. Reduction of portal hypertension with a completion portosystemic gradient of 7 mm Hg. 3. No residual opacification of gastroesophageal varices. No variceal embolization was required or performed. PLAN: *Lactulose TID and continue to evaluate/monitor for hepatic encephalopathy. *The patient will be enrolled in the V[facility] portal hypertension follow-up clinic, and will be closely followed per the protocol. Jon Mugweru, MD [facility] Electronically Signed   By: Jon  Mugweru M.D.   On: 04/12/2023 12:33    IR US Guide Vasc Access Right  Result Date: 04/12/2023 CLINICAL DATA:  Hematemesis.  Upper GI bleed. Briefly, 51-year-old male with history significant for EtOH cirrhosis and esophageal varices s/p EGD with banding 03/01/2023 and plan for interval TIPS. Patient represented to ER with upper GI bleed requiring urgent EGD and repeat banding 04/08/2023. MELD = 16 (04/11/2023). EXAM: Procedures: 1. TRANSJUGULAR INTRAHEPATIC PORTOSYSTEMIC SHUNT (TIPS) 2. INTRACARDIAC ECHOCARDIOGRAPHY (ICE) 3. DIRECT (PERCUTANEOUS) PORTAL VENOUS ACCESS Performing Physician: Jon Mugweru, MD Assistant(s): Heath McCullough, MD A qualified trainee/resident or advanced practice provider (APP) was not immediately available to assist with this case. MEDICATIONS: As antibiotic prophylaxis, Vancomycin 1 gm IV was ordered pre-procedure and administered intravenously within one hour of incision. ANESTHESIA/SEDATION: Sedation by the Anesthesia   Team was performed. Please see anesthesiology log for details. CONTRAST:  145 mL Omnipaque 300, intravenous FLUOROSCOPY TIME:  Fluoroscopic dose; 1273 mGy COMPLICATIONS: None immediate. PROCEDURE: Informed written consent was obtained from the patient and/or patient's representative after a thorough discussion of the procedural risks, benefits and alternatives. All questions were addressed. Maximal Sterile Barrier Technique was utilized including caps, mask, sterile gowns, sterile gloves, sterile drape, hand hygiene and skin antiseptic. A timeout was performed prior to the initiation of the procedure. Initial ultrasound scanning demonstrates a small amount of ascites within the RIGHT abdomen. An image was saved for documentation purposes. Paracentesis was NOT performed. A preliminary ultrasound of the RIGHT groin was performed and demonstrates a patent RIGHT common femoral vein. A permanent ultrasound image was recorded. Using a combination of fluoroscopy and ultrasound, an access site was determined. A  small dermatotomy was made at the planned puncture site. Using ultrasound guidance, access into the RIGHT greater saphenous vein was obtained with visualization of needle entry into the vessel using a standard micropuncture technique. A wire was advanced into the IVC insert all fascial dilation performed. An 8 Fr, 11 cm vascular sheath was placed into the external iliac vein. Through this access site, an 8 Fr AcuNav ICE catheter was advanced with ease under fluoroscopic guidance to the level of the intrahepatic inferior vena cava. A preliminary ultrasound of the RIGHT neck was performed and demonstrates a patent internal jugular vein. A permanent ultrasound image was recorded. Using a combination of fluoroscopy and ultrasound, an access site was determined. A small dermatotomy was made at the planned puncture site. Using ultrasound guidance, access into the RIGHT internal jugular vein was obtained with visualization of needle entry into the vessel using a standard micropuncture technique. A wire was advanced into the IVC and serial fascial dilation performed. A 10 Fr 35 cm Cook TIPS sheath was placed into the internal jugular vein and advanced to the IVC. The jugular sheath was retracted into the RIGHT atrium and manometry was performed. HEPATIC VENOGRAM: A 5 Fr angled tip catheter was then directed into the RIGHT hepatic vein. Hepatic venogram was performed. These images demonstrated patent variant anatomy venous an accessory, inferior RIGHT hepatic vein at the level of the caudate. No evidence of the hepatic venous stenosis. The catheter was advanced to a wedge portion of the a patent vein over which the sheath was advanced into the distal hepatic vein. Using ICE ultrasound visualization the catheter as RIGHT hepatic vein as well as the portal anatomy was defined. Under direct ultrasound visualization, the Argon Scorpion X needle was advanced into the central RIGHT portal vein. Despite multiple attempts, the  hepatic vein and portal vein could not be placed into a single sonographic plane and there was an adequate purchase for transhepatic access. ULTRASOUND-GUIDED PORTAL VENOUS ACCESS: Evaluation of the RIGHT upper quadrant showed a patent portal vein. Under ultrasound guidance, a 15 cm 21-G needle was inserted into the portal vein. A 0.018 inch Nitinol guidewire was advanced through the needle. Hand injection of contrast confirmed position within the portal system. The track was dilated using a transitional dilator set, a 0.035 inch wire was inserted into the portal vein and over the wire a sheath was inserted. A snare was inserted and advanced to the level of the portal vein. "GUN SIGHT" TIPS SHUNT CREATION: A snare was also advanced through the transjugular sheath into the RIGHT hepatic vein. With meticulous triangulation, an oblique view was obtained which overlaid the snares upon each   other and a 15 cm 21-gauge needle was advanced via direct/percutaneous/transhepatic approach through the snares. A perpendicular view was obtained which demonstrated that the needle was through both snares. A 0.018 inch guidewire was advanced through the needle into the RIGHT hepatic vein, snared, and pulled through the jugular access site. Using a CXI crossing catheter, portal venous access was obtained through the hepatic vein. Hand injection of contrast confirmed position within the portal system. A Glidewire was then advanced into the splenic vein. A 5 Fr marking pigtail catheter was then advanced over the wire into the main portal vein and wire removed. Portal venogram was performed which demonstrated a patent portal vein. The tract was then pre-dilated with 8 mm x 10 mm Athletis balloon. A 8-10 mm by 7 + 2 cm of GORE Viatorr TIPS stent was placed. After placement of the shunt, RIGHT atrial and portal manometry was performed. Completion portal venogram demonstrated a patent TIPS endograft without residual filling of the  gastroesophageal varices. The catheters and sheath were removed and manual compression was applied to the RIGHT internal jugular and RIGHT common femoral venous access sites until hemostasis was achieved. The patient was transferred back to the ICU in stable condition. Post-TIPS Mean Pressures (mmHg): Right atrium:23 Portal vein: 30 Portosystemic gradient: 7 FINDINGS: *Access via the RIGHT jugular and greater saphenous veins. *Aborted ICE-guided TIPS attempt secondary to challenging venous anatomy. Accessory RIGHT hepatic vein the caudate without adequate purchase for transhepatic access. *Successful direct portal venous access and TIPS creation, via the accessory RIGHT hepatic vein to the RIGHT portal vein, with placement of a 7 cm GORE Viatorr stent. *Small volume perihepatic ascites. Paracentesis was NOT performed. IMPRESSION: 1. Successful creation of Transjugular Intrahepatic Portosystemic Shunt (TIPS) via 'Gun sight' technique and direct/percutaneous portal venous access, under ultrasound and fluoroscopy. 2. Reduction of portal hypertension with a completion portosystemic gradient of 7 mm Hg. 3. No residual opacification of gastroesophageal varices. No variceal embolization was required or performed. PLAN: *Lactulose TID and continue to evaluate/monitor for hepatic encephalopathy. *The patient will be enrolled in the VIR portal hypertension follow-up clinic, and will be closely followed per the protocol. Jon Mugweru, MD [facility] Electronically Signed   By: Jon  Mugweru M.D.   On: 04/12/2023 12:33   IR US Guide Vasc Access Right  Result Date: 04/12/2023 CLINICAL DATA:  Hematemesis.  Upper GI bleed. Briefly, 51-year-old male with history significant for EtOH cirrhosis and esophageal varices s/p EGD with banding 03/01/2023 and plan for interval TIPS. Patient represented to ER with upper GI bleed requiring urgent EGD and repeat banding 04/08/2023.  MELD = 16 (04/11/2023). EXAM: Procedures: 1. TRANSJUGULAR INTRAHEPATIC PORTOSYSTEMIC SHUNT (TIPS) 2. INTRACARDIAC ECHOCARDIOGRAPHY (ICE) 3. DIRECT (PERCUTANEOUS) PORTAL VENOUS ACCESS Performing Physician: Jon Mugweru, MD Assistant(s): Heath McCullough, MD A qualified trainee/resident or advanced practice provider (APP) was not immediately available to assist with this case. MEDICATIONS: As antibiotic prophylaxis, Vancomycin 1 gm IV was ordered pre-procedure and administered intravenously within one hour of incision. ANESTHESIA/SEDATION: Sedation by the Anesthesia Team was performed. Please see anesthesiology log for details. CONTRAST:  145 mL Omnipaque 300, intravenous FLUOROSCOPY TIME:  Fluoroscopic dose; 1273 mGy COMPLICATIONS: None immediate. PROCEDURE: Informed written consent was obtained from the patient and/or patient's representative after a thorough discussion of the procedural risks, benefits and alternatives. All questions were addressed. Maximal Sterile Barrier Technique was utilized including caps, mask, sterile gowns, sterile gloves, sterile drape, hand hygiene and skin antiseptic. A timeout was performed prior   to the initiation of the procedure. Initial ultrasound scanning demonstrates a small amount of ascites within the RIGHT abdomen. An image was saved for documentation purposes. Paracentesis was NOT performed. A preliminary ultrasound of the RIGHT groin was performed and demonstrates a patent RIGHT common femoral vein. A permanent ultrasound image was recorded. Using a combination of fluoroscopy and ultrasound, an access site was determined. A small dermatotomy was made at the planned puncture site. Using ultrasound guidance, access into the RIGHT greater saphenous vein was obtained with visualization of needle entry into the vessel using a standard micropuncture technique. A wire was advanced into the IVC insert all fascial dilation performed. An 8 Fr, 11 cm vascular sheath was placed into the  external iliac vein. Through this access site, an 8 Fr AcuNav ICE catheter was advanced with ease under fluoroscopic guidance to the level of the intrahepatic inferior vena cava. A preliminary ultrasound of the RIGHT neck was performed and demonstrates a patent internal jugular vein. A permanent ultrasound image was recorded. Using a combination of fluoroscopy and ultrasound, an access site was determined. A small dermatotomy was made at the planned puncture site. Using ultrasound guidance, access into the RIGHT internal jugular vein was obtained with visualization of needle entry into the vessel using a standard micropuncture technique. A wire was advanced into the IVC and serial fascial dilation performed. A 10 Fr 35 cm Cook TIPS sheath was placed into the internal jugular vein and advanced to the IVC. The jugular sheath was retracted into the RIGHT atrium and manometry was performed. HEPATIC VENOGRAM: A 5 Fr angled tip catheter was then directed into the RIGHT hepatic vein. Hepatic venogram was performed. These images demonstrated patent variant anatomy venous an accessory, inferior RIGHT hepatic vein at the level of the caudate. No evidence of the hepatic venous stenosis. The catheter was advanced to a wedge portion of the a patent vein over which the sheath was advanced into the distal hepatic vein. Using ICE ultrasound visualization the catheter as RIGHT hepatic vein as well as the portal anatomy was defined. Under direct ultrasound visualization, the Argon Scorpion X needle was advanced into the central RIGHT portal vein. Despite multiple attempts, the hepatic vein and portal vein could not be placed into a single sonographic plane and there was an adequate purchase for transhepatic access. ULTRASOUND-GUIDED PORTAL VENOUS ACCESS: Evaluation of the RIGHT upper quadrant showed a patent portal vein. Under ultrasound guidance, a 15 cm 21-G needle was inserted into the portal vein. A 0.018 inch Nitinol guidewire  was advanced through the needle. Hand injection of contrast confirmed position within the portal system. The track was dilated using a transitional dilator set, a 0.035 inch wire was inserted into the portal vein and over the wire a sheath was inserted. A snare was inserted and advanced to the level of the portal vein. "GUN SIGHT" TIPS SHUNT CREATION: A snare was also advanced through the transjugular sheath into the RIGHT hepatic vein. With meticulous triangulation, an oblique view was obtained which overlaid the snares upon each other and a 15 cm 21-gauge needle was advanced via direct/percutaneous/transhepatic approach through the snares. A perpendicular view was obtained which demonstrated that the needle was through both snares. A 0.018 inch guidewire was advanced through the needle into the RIGHT hepatic vein, snared, and pulled through the jugular access site. Using a CXI crossing catheter, portal venous access was obtained through the hepatic vein. Hand injection of contrast confirmed position within the portal system. A Glidewire was   then advanced into the splenic vein. A 5 Fr marking pigtail catheter was then advanced over the wire into the main portal vein and wire removed. Portal venogram was performed which demonstrated a patent portal vein. The tract was then pre-dilated with 8 mm x 10 mm Athletis balloon. A 8-10 mm by 7 + 2 cm of GORE Viatorr TIPS stent was placed. After placement of the shunt, RIGHT atrial and portal manometry was performed. Completion portal venogram demonstrated a patent TIPS endograft without residual filling of the gastroesophageal varices. The catheters and sheath were removed and manual compression was applied to the RIGHT internal jugular and RIGHT common femoral venous access sites until hemostasis was achieved. The patient was transferred back to the ICU in stable condition. Post-TIPS Mean Pressures (mmHg): Right atrium:23 Portal vein: 30 Portosystemic gradient: 7 FINDINGS:  *Access via the RIGHT jugular and greater saphenous veins. *Aborted ICE-guided TIPS attempt secondary to challenging venous anatomy. Accessory RIGHT hepatic vein the caudate without adequate purchase for transhepatic access. *Successful direct portal venous access and TIPS creation, via the accessory RIGHT hepatic vein to the RIGHT portal vein, with placement of a 7 cm GORE Viatorr stent. *Small volume perihepatic ascites. Paracentesis was NOT performed. IMPRESSION: 1. Successful creation of Transjugular Intrahepatic Portosystemic Shunt (TIPS) via 'Gun sight' technique and direct/percutaneous portal venous access, under ultrasound and fluoroscopy. 2. Reduction of portal hypertension with a completion portosystemic gradient of 7 mm Hg. 3. No residual opacification of gastroesophageal varices. No variceal embolization was required or performed. PLAN: *Lactulose TID and continue to evaluate/monitor for hepatic encephalopathy. *The patient will be enrolled in the VIR portal hypertension follow-up clinic, and will be closely followed per the protocol. Jon Mugweru, MD [facility] Electronically Signed   By: Jon  Mugweru M.D.   On: 04/12/2023 12:33   DG Abd Portable 1V  Result Date: 04/12/2023 CLINICAL DATA:  Tube placement EXAM: PORTABLE ABDOMEN - 1 VIEW COMPARISON:  02/28/2023 FINDINGS: Image of the upper abdomen demonstrates N/OG tube tip superimposed with the stomach below the diaphragm. IMPRESSION: N/OG tube in place. Electronically Signed   By: Joshua  Pleasure M.D.   On: 04/12/2023 11:06   Portable Chest x-ray  Result Date: 04/12/2023 CLINICAL DATA:  Check endotracheal tube placement EXAM: PORTABLE CHEST 1 VIEW COMPARISON:  04/11/2023 FINDINGS: Left jugular central line is again noted in the mid superior vena cava. Endotracheal tube is seen 3.2 cm above the carina. Cardiac shadow is enlarged but stable. Diffuse increased airspace opacity is noted  bilaterally most consistent with worsening edema. No pneumothorax is seen. No bony abnormality is noted. IMPRESSION: Endotracheal tube in satisfactory position. Worsening pulmonary edema. Electronically Signed   By: Mark  Lukens M.D.   On: 04/12/2023 03:35   ECHOCARDIOGRAM COMPLETE  Result Date: 04/11/2023    ECHOCARDIOGRAM REPORT   Patient Name:   Ethan Wells Date of Exam: 04/11/2023 Medical Rec #:  [id number]           Height:       65.0 in Accession #:    [id number]          Weight:       197.3 lb Date of Birth:  11/05/1971           BSA:          1.967 m Patient Age:    51 years            BP:             115/68 mmHg Patient Gender: M                   HR:           89 bpm. Exam Location:  Inpatient Procedure: 2D Echo, Cardiac Doppler, Color Doppler and Intracardiac            Opacification Agent Indications:    Other abnormalities of the heart R00.8  History:        Patient has prior history of Echocardiogram examinations. CAD;                 Risk Factors:Hypertension and Dyslipidemia.  Sonographer:    Lindsey Urban Referring Phys: [id number] JENNIFER C OMOHUNDRO IMPRESSIONS  1. Left ventricular ejection fraction, by estimation, is 55 to 60%. The left ventricle has normal function. The left ventricle has no regional wall motion abnormalities. There is mild left ventricular hypertrophy. Left ventricular diastolic parameters are consistent with Grade II diastolic dysfunction (pseudonormalization).  2. Right ventricular systolic function is normal. The right ventricular size is normal. There is normal pulmonary artery systolic pressure.  3. Anterior leaflet has increased thickening, likely calcification. Mild mitral valve regurgitation.  4. The aortic valve is tricuspid. Aortic valve regurgitation is not visualized.  5. The inferior vena cava is normal in size with greater than 50% respiratory variability, suggesting right atrial pressure of 3 mmHg. FINDINGS  Left Ventricle: Left ventricular ejection fraction,  by estimation, is 55 to 60%. The left ventricle has normal function. The left ventricle has no regional wall motion abnormalities. Definity contrast agent was given IV to delineate the left ventricular  endocardial borders. The left ventricular internal cavity size was normal in size. There is mild left ventricular hypertrophy. Left ventricular diastolic parameters are consistent with Grade II diastolic dysfunction (pseudonormalization). Right Ventricle: The right ventricular size is normal. Right ventricular systolic function is normal. There is normal pulmonary artery systolic pressure. The tricuspid regurgitant velocity is 2.54 m/s, and with an assumed right atrial pressure of 3 mmHg,  the estimated right ventricular systolic pressure is 28.8 mmHg. Left Atrium: Left atrial size was normal in size. Right Atrium: Right atrial size was normal in size. Pericardium: There is no evidence of pericardial effusion. Mitral Valve: Anterior leaflet has increased thickening, likely calcification. Mild mitral valve regurgitation. Tricuspid Valve: Tricuspid valve regurgitation is mild. Aortic Valve: The aortic valve is tricuspid. Aortic valve regurgitation is not visualized. Pulmonic Valve: Pulmonic valve regurgitation is not visualized. Aorta: The aortic root and ascending aorta are structurally normal, with no evidence of dilitation. Venous: The inferior vena cava is normal in size with greater than 50% respiratory variability, suggesting right atrial pressure of 3 mmHg. IAS/Shunts: No atrial level shunt detected by color flow Doppler.  LEFT VENTRICLE PLAX 2D LVIDd:         5.80 cm      Diastology LVIDs:         4.60 cm      LV e' medial:    4.35 cm/s LV PW:         1.10 cm      LV E/e' medial:  22.0 LV IVS:        1.00 cm      LV e' lateral:   8.59 cm/s LVOT diam:     2.20 cm      LV E/e' lateral: 11.1 LV SV:         87 LV SV Index:   44 LVOT Area:       3.80 cm  LV Volumes (MOD) LV vol d, MOD A2C: 156.0 ml LV vol d, MOD A4C:  149.0 ml LV vol s, MOD A2C: 69.6 ml LV vol s, MOD A4C: 68.0 ml LV SV MOD A2C:     86.4 ml LV SV MOD A4C:     149.0 ml LV SV MOD BP:      87.6 ml RIGHT VENTRICLE             IVC RV Basal diam:  4.20 cm     IVC diam: 1.80 cm RV S prime:     12.30 cm/s TAPSE (M-mode): 2.0 cm LEFT ATRIUM             Index        RIGHT ATRIUM           Index LA diam:        3.90 cm 1.98 cm/m   RA Area:     20.40 cm LA Vol (A2C):   66.0 ml 33.56 ml/m  RA Volume:   55.90 ml  28.42 ml/m LA Vol (A4C):   59.3 ml 30.15 ml/m LA Biplane Vol: 62.5 ml 31.78 ml/m  AORTIC VALVE LVOT Vmax:   102.00 cm/s LVOT Vmean:  68.700 cm/s LVOT VTI:    0.229 m  AORTA Ao Root diam: 3.30 cm Ao Asc diam:  3.10 cm MITRAL VALVE               TRICUSPID VALVE MV Area (PHT): 4.60 cm    TR Peak grad:   25.8 mmHg MV Decel Time: 165 msec    TR Vmax:        254.00 cm/s MV E velocity: 95.50 cm/s MV A velocity: 32.60 cm/s  SHUNTS MV E/A ratio:  2.93        Systemic VTI:  0.23 m                            Systemic Diam: 2.20 cm Mary Branch Electronically signed by Mary Branch Signature Date/Time: 04/11/2023/10:06:14 AM    Final    DG Chest Port 1 View  Result Date: 04/11/2023 CLINICAL DATA:  5626 Acute respiratory failure 5626 EXAM: PORTABLE CHEST 1 VIEW COMPARISON:  Chest XR, 04/09/2023.  CT AP, 01/17/2023. FINDINGS: Support lines: Interval extubation. LEFT IJ CVC with catheter tip well-positioned at the superior cavoatrial junction. Cardiomegaly. Perihilar and central bronchial wall thickening. Minimal bibasilar interstitial opacities. No pleural effusion or pneumothorax. No interval osseous abnormality. IMPRESSION: 1. Extubation.  Stable, well-positioned LEFT IJ-approach CVC. 2. Cardiomegaly with perihilar thickening and minimal basilar opacities. Findings favored to represent mild pulmonary edema and atelectasis. Electronically Signed   By: Jon  Mugweru M.D.   On: 04/11/2023 07:30   DG Chest Port 1 View  Result Date: 04/09/2023 CLINICAL DATA:  Hypoxia EXAM:  PORTABLE CHEST 1 VIEW COMPARISON:  04/09/2023 FINDINGS: Endotracheal tube is again noted and stable. Left jugular central line are again seen in satisfactory position. Significant improvement in the degree of congestive failure is noted. Some residual basilar opacities are seen. No bony abnormality is noted. IMPRESSION: Near complete resolution of previously seen pulmonary edema. Electronically Signed   By: Mark  Lukens M.D.   On: 04/09/2023 12:12   DG CHEST PORT 1 VIEW  Result Date: 04/09/2023 CLINICAL DATA:  Respiratory failure, prone repositioning EXAM: PORTABLE CHEST 1 VIEW COMPARISON:  04/08/2023 FINDINGS: Endotracheal tube is seen 14 mm above the carina. Left internal jugular central   venous catheter tip noted overlying the expected superior vena cava. Pulmonary insufflation is stable. Superimposed extensive bilateral perihilar and lower lung zone consolidation in keeping with severe alveolar pulmonary edema or diffuse pneumonic infiltrate has progressed in the interval since prior examination. No pneumothorax or pleural effusion. Cardiac size within normal limits. IMPRESSION: 1. Endotracheal tube 14 mm above the carina. 2. Progressive bilateral perihilar and lower lung zone consolidation, severe alveolar pulmonary edema or diffuse pneumonic infiltrate. Electronically Signed   By: Ashesh  Parikh M.D.   On: 04/09/2023 03:01   DG CHEST PORT 1 VIEW  Result Date: 04/08/2023 CLINICAL DATA:  Hypoxia EXAM: PORTABLE CHEST 1 VIEW COMPARISON:  04/07/2023 FINDINGS: The endotracheal tube has been placed with tip measuring 3.8 cm above the carina. Left central venous catheter with tip over the low SVC region. No pneumothorax. Cardiac enlargement with bilateral perihilar infiltrates, progressing since prior study, likely edema. No pleural effusions. No pneumothorax. Mediastinal contours appear intact. IMPRESSION: 1. Appliances appear in satisfactory position. 2. Cardiac enlargement with perihilar infiltration  increasing since prior study, likely edema. Electronically Signed   By: Yael  Stevens M.D.   On: 04/08/2023 22:04     Assessment/Plan 1.  Mesenteric stenosis:   The patient seems to be improved overall.  However, he continues to complain of nonspecific abdominal pain.  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  Given that the patient has had frequent complaints of epigastric pain and discomfort, this is certainly a possible cause of the patient's ongoing pain and discomfort.     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  Friday May 09, 2023 we will plan on angiography.   2.  Back and bilateral lower extremity pain: This seems to be significantly improved.  I defer further workup to the primary service as this does not appear to be related to his vascular insufficiency.   3.  Cirrhosis and portal hypertension: Patient's recent TIPS procedure is patent appears to be working.  Although he did describe 1 bloody bowel movement he is certainly not presenting as a cirrhotic with hemorrhage from esophageal varices.  At this point I would defer further workup to the primary service in association with the GI service.   Roseana Rhine, MD  05/07/2023 7:10 PM   

## 2023-05-07 NOTE — Progress Notes (Signed)
MRN : [id number]  Ethan Wells is a 52 y.o. (25-Nov-1971) male who presents with chief complaint of check circulation.  History of Present Illness:   Patient continues to report abdominal pain.  There does not appear to be significant bleeding.  He continues to describe the abdominal pain, as diffuse, constant, aching, moderate to severe and nonradiating.   He is clearly much more alert today than he was in the ER on admission.  Current Meds  Medication Sig   folic acid (FOLVITE) 1 MG tablet Take 1 tablet (1 mg total) by mouth daily.   furosemide (LASIX) 20 MG tablet Take 1 tablet (20 mg total) by mouth daily.   gabapentin (NEURONTIN) 300 MG capsule Take 1 capsule (300 mg total) by mouth 2 (two) times daily.   hydrocortisone (CORTEF) 10 MG tablet Take 1 tablet (10 mg total) by mouth daily.   hydrocortisone (CORTEF) 10 MG tablet Take 1/2 tablet (5 mg total) by mouth daily after lunch.   lactulose (CHRONULAC) 10 GM/15ML solution Take 30 mLs (20 g total) by mouth 3 (three) times daily. Titrate to 1-2 BM per day   midodrine (PROAMATINE) 5 MG tablet Take 1 tablet (5 mg total) by mouth 3 (three) times daily. Skip the dose if Systolic BP greater than 110 mmHg and or HR greater than 100   Multiple Vitamin (MULTIVITAMIN WITH MINERALS) TABS tablet Take 1 tablet by mouth daily.   nitroGLYCERIN (NITROSTAT) 0.4 MG SL tablet Place 1 tablet (0.4 mg total) under the tongue every 5 (five) minutes as needed for chest pain.   pantoprazole (PROTONIX) 40 MG tablet Take 1 tablet (40 mg total) by mouth 2 (two) times daily.   potassium chloride SA (KLOR-CON M) 20 MEQ tablet Take 1 tablet (20 mEq total) by mouth daily.   QUEtiapine (SEROQUEL) 50 MG tablet Take 1 tablet (50 mg total) by mouth at bedtime.   rifaximin (XIFAXAN) 550 MG TABS tablet Take 1 tablet (550 mg total) by mouth 2 (two) times daily.   rosuvastatin (CRESTOR) 40 MG  tablet Take 1 tablet (40 mg total) by mouth daily.   spironolactone (ALDACTONE) 25 MG tablet Take 0.5 tablets (12.5 mg total) by mouth daily.   thiamine (VITAMIN B1) 100 MG tablet Take 1 tablet (100 mg total) by mouth daily.    Past Medical History:  Diagnosis Date   CAD (coronary artery disease)    a. 01/2011 Anterior STEMI/Cath/PCI: LM nl, LAD 100d (3.5x45mm Vision BMS placed), LCX 72m, RI 50, RCA min irregs, EF 40% w/ apical, inferoapical HK; b. 10/2022 NSTSEMI/Cath: LM nl, LAD 5p/m, RI 85, LCX 126m, OM1 mild dzs, OM3 100 fills via L->L collats from dLAD, RCA 80p, 153m, RPDA fills via collats from LAD. EF 45-50%-->Med Rx.   Cardiac arrest - ventricular fibrillation    a. In setting of STEMI 01/2011   Chronic pain    Cocaine abuse (HCC)    Depression with anxiety    ETOH abuse    a. 6-12 beers/day   GERD (gastroesophageal reflux disease)    GI bleed    Hemorrhoids  Hyperlipidemia    Hypertension    Ischemic cardiomyopathy    a. 06/2011 Echo: EF 45-50%, No rwma; b. 10/2022 Echo: EF 55-60%, no rwma, nl RV fxn.   Marijuana abuse    Migraines    Tobacco abuse    a. 1/2 ppd x 26 yrs    Past Surgical History:  Procedure Laterality Date   COLONOSCOPY  08/09/2021   Procedure: COLONOSCOPY;  Surgeon: Midge Minium, MD;  Location: [facility] ENDOSCOPY;  Service: Endoscopy;;   CORONARY ANGIOPLASTY WITH STENT PLACEMENT     ESOPHAGEAL BANDING  04/08/2023   Procedure: ESOPHAGEAL BANDING;  Surgeon: Tressia Danas, MD;  Location: [facility] ENDOSCOPY;  Service: Gastroenterology;;   ESOPHAGOGASTRODUODENOSCOPY N/A 05/30/2022   Procedure: ESOPHAGOGASTRODUODENOSCOPY (EGD);  Surgeon: Toney Reil, MD;  Location: [facility] ENDOSCOPY;  Service: Gastroenterology;  Laterality: N/A;   ESOPHAGOGASTRODUODENOSCOPY (EGD) WITH PROPOFOL N/A 03/01/2021   Procedure: ESOPHAGOGASTRODUODENOSCOPY (EGD) WITH PROPOFOL;  Surgeon: Toney Reil, MD;  Location: [facility];  Service: Endoscopy;  Laterality: N/A;    ESOPHAGOGASTRODUODENOSCOPY (EGD) WITH PROPOFOL N/A 08/09/2021   Procedure: ESOPHAGOGASTRODUODENOSCOPY (EGD) WITH PROPOFOL;  Surgeon: Midge Minium, MD;  Location: [facility] ENDOSCOPY;  Service: Endoscopy;  Laterality: N/A;   ESOPHAGOGASTRODUODENOSCOPY (EGD) WITH PROPOFOL N/A 03/01/2023   Procedure: ESOPHAGOGASTRODUODENOSCOPY (EGD) WITH PROPOFOL;  Surgeon: Jaynie Collins, DO;  Location: [facility] ENDOSCOPY;  Service: Gastroenterology;  Laterality: N/A;   ESOPHAGOGASTRODUODENOSCOPY (EGD) WITH PROPOFOL N/A 04/08/2023   Procedure: ESOPHAGOGASTRODUODENOSCOPY (EGD) WITH PROPOFOL;  Surgeon: Tressia Danas, MD;  Location: [facility] ENDOSCOPY;  Service: Gastroenterology;  Laterality: N/A;   EVALUATION UNDER ANESTHESIA WITH HEMORRHOIDECTOMY N/A 10/31/2022   Procedure: EXAM UNDER ANESTHESIA WITH SUTURE LIGATION OF TWO BLEEDING PEDICLES;  Surgeon: Henrene Dodge, MD;  Location: [facility] ORS;  Service: General;  Laterality: N/A;   FLEXIBLE SIGMOIDOSCOPY N/A 10/02/2022   Procedure: FLEXIBLE SIGMOIDOSCOPY;  Surgeon: Meryl Dare, MD;  Location: [facility] ENDOSCOPY;  Service: Gastroenterology;  Laterality: N/A;   IR TIPS  04/11/2023   IR US GUIDE VASC ACCESS RIGHT  04/11/2023   IR US GUIDE VASC ACCESS RIGHT  04/11/2023   IR US GUIDE VASC ACCESS RIGHT  04/11/2023   LEFT HEART CATH AND CORONARY ANGIOGRAPHY N/A 11/04/2022   Procedure: LEFT HEART CATH AND CORONARY ANGIOGRAPHY;  Surgeon: Iran Ouch, MD;  Location: [facility] INVASIVE CV LAB;  Service: Cardiovascular;  Laterality: N/A;   RADIOLOGY WITH ANESTHESIA N/A 04/11/2023   Procedure: IR WITH ANESTHESIA;  Surgeon: Oley Balm, MD;  Location: [facility] OR;  Service: Radiology;  Laterality: N/A;   VISCERAL ANGIOGRAPHY N/A 10/24/2022   Procedure: VISCERAL ANGIOGRAPHY;  Surgeon: Annice Needy, MD;  Location: [facility] INVASIVE CV LAB;  Service: Cardiovascular;  Laterality: N/A;    Social History Social History   Tobacco Use   Smoking status: Every Day    Packs/day: 0.50    Years: 41.00     Additional pack years: 0.00    Total pack years: 20.50    Types: Cigarettes   Smokeless tobacco: Never   Tobacco comments:    started age 76  Vaping Use   Vaping Use: Never used  Substance Use Topics   Alcohol use: Yes    Alcohol/week: 12.0 standard drinks of alcohol    Types: 12 Cans of beer per week    Comment: at least a 6 pack of beer daily and often a 12 pack   Drug use: Yes    Types: Cocaine, Marijuana    Family History Family History  Problem Relation Age of Onset  Coronary artery disease Mother        alive   Other Other        2 sisters alive and well   Alcohol abuse Father        died @ 75   Other Paternal Grandmother        PACER    Allergies  Allergen Reactions   Penicillins Hives and Rash    Tolerated Ceftriaxone 10/2022     REVIEW OF SYSTEMS (Negative unless checked)  Constitutional: [] Weight loss  [] Fever  [] Chills Cardiac: [] Chest pain   [] Chest pressure   [] Palpitations   [] Shortness of breath when laying flat   [] Shortness of breath with exertion. Vascular:  [x] Pain in legs with walking   [] Pain in legs at rest  [] History of DVT   [] Phlebitis   [] Swelling in legs   [] Varicose veins   [] Non-healing ulcers Pulmonary:   [] Uses home oxygen   [] Productive cough   [] Hemoptysis   [] Wheeze  [] COPD   [] Asthma Neurologic:  [] Dizziness   [] Seizures   [] History of stroke   [] History of TIA  [] Aphasia   [] Vissual changes   [] Weakness or numbness in arm   [] Weakness or numbness in leg Musculoskeletal:   [] Joint swelling   [] Joint pain   [] Low back pain Hematologic:  [] Easy bruising  [] Easy bleeding   [] Hypercoagulable state   [] Anemic Gastrointestinal:  [] Diarrhea   [] Vomiting  [] Gastroesophageal reflux/heartburn   [] Difficulty swallowing. Genitourinary:  [] Chronic kidney disease   [] Difficult urination  [] Frequent urination   [] Blood in urine Skin:  [] Rashes   [] Ulcers  Psychological:  [] History of anxiety   []  History of major depression.  Physical  Examination  Vitals:   05/07/23 0602 05/07/23 0823 05/07/23 1210 05/07/23 1707  BP:  (!) 104/59 (!) 90/49 (!) 93/47  Pulse:  93 91 87  Resp:  18 16 16   Temp:  98.5 F (36.9 C) 98.8 F (37.1 C) 98 F (36.7 C)  TempSrc:  Oral Oral   SpO2:  100% 98% 98%  Weight: 82.6 kg     Height:       Body mass index is 30.3 kg/m. Gen: WD/WN, much more alert mild distress  Head: [place]/AT, No temporalis wasting.  Ear/Nose/Throat: Hearing grossly intact, nares w/o erythema or drainage Eyes: PER, EOMI, sclera nonicteric.  Neck: Supple, no masses.  No bruit or JVD.  Pulmonary:  Good air movement, no audible wheezing, no use of accessory muscles.  Cardiac: RRR, normal S1, S2, no Murmurs. Vascular:   Vessel Right Left  Radial Palpable Palpable  Gastrointestinal: Firm, moderately distended. No guarding/no peritoneal signs.  Musculoskeletal: M/S 4/5 throughout but this seems to be more effort related than true weakness.  No visible deformity.  Neurologic: CN 2-12 intact. Pain and light touch intact in extremities.  Symmetrical.  Speech is fluent. Motor exam as listed above. Psychiatric: Judgment intact, Mood & affect appropriate for pt's clinical situation. Dermatologic: No rashes or ulcers noted.  No changes consistent with cellulitis.   CBC Lab Results  Component Value Date   WBC 5.1 05/07/2023   HGB 9.1 (L) 05/07/2023   HCT 29.0 (L) 05/07/2023   MCV 90.6 05/07/2023   PLT 142 (L) 05/07/2023    BMET    Component Value Date/Time   NA 139 05/07/2023 0519   NA 134 (L) 09/06/2013 1458   K 3.3 (L) 05/07/2023 0519   K 3.5 09/06/2013 1458   CL 111 05/07/2023 0519   CL 102 09/06/2013  1458   CO2 21 (L) 05/07/2023 0519   CO2 23 09/06/2013 1458   GLUCOSE 134 (H) 05/07/2023 0519   GLUCOSE 167 (H) 09/06/2013 1458   BUN <5 (L) 05/07/2023 0519   BUN 4 (L) 09/06/2013 1458   CREATININE 0.70 05/07/2023 0519   CREATININE 0.49 (L) 09/23/2022 1034   CALCIUM 8.0 (L) 05/07/2023 0519   CALCIUM 8.9  09/06/2013 1458   GFRNONAA >60 05/07/2023 0519   GFRNONAA >60 09/06/2013 1458   GFRAA >60 06/05/2020 1109   GFRAA >60 09/06/2013 1458   Estimated Creatinine Clearance: 109.2 mL/min (by C-G formula based on SCr of 0.7 mg/dL).  COAG Lab Results  Component Value Date   INR 1.5 (H) 05/05/2023   INR 1.5 (H) 04/11/2023   INR 1.6 (H) 04/07/2023    Radiology DG Lumbar Spine 2-3 Views  Result Date: 05/06/2023 CLINICAL DATA:  Low back pain. EXAM: LUMBAR SPINE - 2-3 VIEW COMPARISON:  CT abdomen pelvis-05/05/2023 FINDINGS: There are 5 non rib-bearing lumbar type vertebral bodies. Normal alignment of the lumbar spine. No anterolisthesis or retrolisthesis. Lumbar vertebral body heights appear preserved. Mild to moderate multilevel lumbar spine DDD, worse at L3-L4 and L4-L5 with disc space height loss, endplate irregularity and sclerosis. Limited visualization of the bilateral SI joints is normal. TIPS stent overlies the right upper abdominal quadrant. Vascular stent overlies the expected location of the SMA IMPRESSION: Mild-to-moderate multilevel lumbar spine DDD, worse at L3-L4 and L4-L5. Electronically Signed   By: Simonne Come M.D.   On: 05/06/2023 10:04   US ABDOMEN LIMITED RUQ (LIVER/GB)  Result Date: 05/05/2023 CLINICAL DATA:  Right upper quadrant abdominal pain EXAM: ULTRASOUND ABDOMEN LIMITED RIGHT UPPER QUADRANT COMPARISON:  None Available. FINDINGS: Gallbladder: The gallbladder is mildly distended and a small amount of layering sludge is seen within the gallbladder. However, there is no gallbladder wall thickening and no pericholecystic fluid is identified. The sonographic Eulah Pont sign is reportedly NEGATIVE. Common bile duct: Diameter: 4 mm in proximal diameter Liver: Subtle contour nodularity of the liver is seen in keeping with cirrhosis. Tips shunt is partially visualized within the right hepatic lobe. No focal intrahepatic mass is identified. No intrahepatic biliary ductal dilation. Portal vein  is patent on color Doppler imaging with normal direction of blood flow towards the liver. There is antegrade flow within the proximal portion of the shunt and retrograde flow within the left portal vein, expected following shunt placement. Other: No ascites IMPRESSION: 1. Gallbladder sludge without sonographic evidence of acute cholecystitis. 2. Hepatic cirrhosis. 3. Patent portal vein with normal direction of blood flow. There is retrograde flow within the left portal vein, expected following shunt placement. Electronically Signed   By: Helyn Numbers M.D.   On: 05/05/2023 04:03   CT ABDOMEN PELVIS W CONTRAST  Result Date: 05/05/2023 CLINICAL DATA:  Abdominal pain, acute, nonlocalized EXAM: CT ABDOMEN AND PELVIS WITH CONTRAST TECHNIQUE: Multidetector CT imaging of the abdomen and pelvis was performed using the standard protocol following bolus administration of intravenous contrast. RADIATION DOSE REDUCTION: This exam was performed according to the departmental dose-optimization program which includes automated exposure control, adjustment of the mA and/or kV according to patient size and/or use of iterative reconstruction technique. CONTRAST:  OMNIPAQUE IOHEXOL 300 MG/ML  SOLN COMPARISON:  09/30/2022 FINDINGS: Lower chest: No acute abnormality. Stable cardiomegaly. There is circumferential thickening of the distal esophagus which appears stable since prior examination, poss fleck ting changes of esophagitis. Hepatobiliary: Right portal vein-hepatic vein tips shunt is in place extending  from the portal bifurcation to the hepatocaval junction. The shunt is patent. Mild contour nodularity of the liver is seen in keeping with changes of cirrhosis. No enhancing intrahepatic mass. The gallbladder is distended and there is trace pericholecystic inflammatory stranding which may reflect changes of acute cholecystitis. No intra or extrahepatic biliary ductal dilation. Pancreas: Unremarkable Spleen: Stable mild  splenomegaly with the spleen measuring 13.9 cm in greatest dimension. No intrasplenic lesions are seen. Splenic vein is patent. Adrenals/Urinary Tract: Adrenal glands are unremarkable. Kidneys are normal, without renal calculi, focal lesion, or hydronephrosis. Bladder is unremarkable. Stomach/Bowel: The stomach, small bowel, and large bowel are unremarkable. The appendix is not clearly identified and is likely absent. No free intraperitoneal gas or fluid. Vascular/Lymphatic: Superior mesenteric artery stenting has been performed with the luminal diameter of the stented segment measuring 2 mm in diameter with small amount of neointimal hyperplasia noted. The vessel distally remains patent. Celiac axis demonstrates a 50-75% narrowing proximally secondary to mass effect from the median arcuate ligament. Inferior mesenteric artery is widely patent. Mild aortoiliac atherosclerotic calcification. No aortic aneurysm. Reproductive: Prostate is unremarkable. Other: No abdominal wall hernia Musculoskeletal: No acute bone abnormality. IMPRESSION: 1. Distended gallbladder with trace pericholecystic inflammatory stranding which may reflect changes of acute cholecystitis. Correlation with clinical examination and liver enzymes is recommended. If indicated, this would be better assessed with dedicated right upper quadrant sonography. 2. Morphologic changes in keeping with cirrhosis and portal venous hypertension with mild splenomegaly. Patent TIPS shunt. 3. Interval superior mesenteric artery stenting with luminal diameter of 2 mm with small amount of neointimal hyperplasia noted. The vessel distally remains patent. Given findings within the celiac axis below, clinical correlation for signs and symptoms of chronic mesenteric ischemia is recommended. Endovascular revision of the stented segment may be warranted. 4. 50-75% narrowing of the proximal celiac axis secondary to mass effect from the median arcuate ligament. 5.  Circumferential thickening of the distal esophagus, stable since prior examination, possibly reflecting changes of esophagitis. 6. Stable cardiomegaly. Aortic Atherosclerosis (ICD10-I70.0). Electronically Signed   By: Helyn Numbers M.D.   On: 05/05/2023 02:42   CT Head Wo Contrast  Result Date: 05/05/2023 CLINICAL DATA:  Altered mental status. EXAM: CT HEAD WITHOUT CONTRAST TECHNIQUE: Contiguous axial images were obtained from the base of the skull through the vertex without intravenous contrast. RADIATION DOSE REDUCTION: This exam was performed according to the departmental dose-optimization program which includes automated exposure control, adjustment of the mA and/or kV according to patient size and/or use of iterative reconstruction technique. COMPARISON:  Head CT dated 09/30/2022. FINDINGS: Brain: Mild age-related atrophy and chronic microvascular ischemic changes. There is no acute intracranial hemorrhage. No mass effect or midline shift. No extra-axial fluid collection. Vascular: No hyperdense vessel or unexpected calcification. Skull: Normal. Negative for fracture or focal lesion. Sinuses/Orbits: No acute finding. Other: None IMPRESSION: 1. No acute intracranial pathology. 2. Mild age-related atrophy and chronic microvascular ischemic changes. Electronically Signed   By: Elgie Collard M.D.   On: 05/05/2023 02:28   DG Chest 2 View  Result Date: 05/04/2023 CLINICAL DATA:  Chest pain. EXAM: CHEST - 2 VIEW COMPARISON:  Chest radiograph dated 04/16/2023. FINDINGS: No focal consolidation, pleural effusion, or pneumothorax. Mild cardiomegaly. No acute osseous pathology. IMPRESSION: 1. No active cardiopulmonary disease. 2. Mild cardiomegaly. Electronically Signed   By: Elgie Collard M.D.   On: 05/04/2023 23:41   DG Swallowing Func-Speech Pathology  Result Date: 04/21/2023 Table formatting from the original result was not included. Modified  Barium Swallow Study Patient Details Name: Ethan Wells MRN: [id number] Date of Birth: 11-07-1971 Today's Date: 04/21/2023 HPI/PMH: HPI: Patient is a 52 y.o. male with PMH: recurrent GI bleed with known variceal bleed, ischemic cardiomyopathy, ETOH abuse, polysubstance abuse, HTN, HLD, depression and anxiety. He presented to [facility] ED on 04/07/23 with acute GI bleed. He was admitted previous month for GI bleed and TIPS procedure recommended at that time but patient declined it. In ED, patient was hypotensive, mildly tachycardic. EGD completed 4/23. He was intubated 4/23 for flash pulmonary edema and extubated 4/25. He was reintubated on 4/27 for TIPS procedure but unable to be extubated following procedure. On 4/30 NG was placed and patient was extubated.  Pt has undergone BSE with recommendation for NPO - Clinical Impression: Clinical Impression: Pt presented with functional oropharyngeal swallow with adequate oral control of bolus, reliable laryngeal vestibule closure with no penetration/aspiration, normal pharyngeal stripping with no residue post-swallow, and normal ability to manage swallowing a pill with thin liquid with no aspiration and  transfer of pill through esophagus. Recommend starting a regular diet, thin liquids; meds in liquid. Mental status will be the primary barrier to adequate PO intake. SLP will f/u x1 to ensure functionality during mealtime. D/W RD, RN, MD. Factors that may increase risk of adverse event in presence of aspiration Rubye Oaks & Clearance Coots 2021): Factors that may increase risk of adverse event in presence of aspiration Rubye Oaks & Clearance Coots 2021): Reduced cognitive function Recommendations/Plan: Swallowing Evaluation Recommendations Swallowing Evaluation Recommendations Recommendations: PO diet PO Diet Recommendation: Regular; Thin liquids (Level 0) Liquid Administration via: Cup; Straw Medication Administration: Whole meds with liquid Supervision: Patient able to self-feed; Set-up assistance for safety Oral care recommendations: Oral care  BID (2x/day) Treatment Plan Treatment Plan Treatment recommendations: Therapy as outlined in treatment plan below Follow-up recommendations: No SLP follow up Treatment frequency: Min 1x/week Treatment duration: 1 week Interventions: Diet toleration management by SLP Recommendations Recommendations for follow up therapy are one component of a multi-disciplinary discharge planning process, led by the attending physician.  Recommendations may be updated based on patient status, additional functional criteria and insurance authorization. Assessment: Orofacial Exam: Orofacial Exam Oral Cavity - Dentition: Poor condition; Missing dentition Orofacial Anatomy: WFL Oral Motor/Sensory Function: WFL Anatomy: Anatomy: WFL Boluses Administered: Boluses Administered Boluses Administered: Thin liquids (Level 0); Mildly thick liquids (Level 2, nectar thick); Puree; Solid  Oral Impairment Domain: Oral Impairment Domain Lip Closure: No labial escape Tongue control during bolus hold: Escape to lateral buccal cavity/floor of mouth Bolus preparation/mastication: Timely and efficient chewing and mashing Bolus transport/lingual motion: Brisk tongue motion Oral residue: Trace residue lining oral structures Location of oral residue : Tongue Initiation of pharyngeal swallow : Pyriform sinuses  Pharyngeal Impairment Domain: Pharyngeal Impairment Domain Soft palate elevation: No bolus between soft palate (SP)/pharyngeal wall (PW) Laryngeal elevation: Complete superior movement of thyroid cartilage with complete approximation of arytenoids to epiglottic petiole Anterior hyoid excursion: Complete anterior movement Epiglottic movement: Complete inversion Laryngeal vestibule closure: Complete, no air/contrast in laryngeal vestibule Pharyngeal stripping wave : Present - complete Pharyngeal contraction (A/P view only): N/A Pharyngoesophageal segment opening: Complete distension and complete duration, no obstruction of flow Tongue base retraction: No  contrast between tongue base and posterior pharyngeal wall (PPW) Pharyngeal residue: Complete pharyngeal clearance Location of pharyngeal residue: N/A  Esophageal Impairment Domain: Esophageal Impairment Domain Esophageal clearance upright position: Complete clearance, esophageal coating Pill: Esophageal Impairment Domain Esophageal clearance upright position: Complete clearance, esophageal coating Penetration/Aspiration Scale Score: Penetration/Aspiration Scale  Score 1.  Material does not enter airway: Thin liquids (Level 0); Mildly thick liquids (Level 2, nectar thick); Puree; Solid; Pill Compensatory Strategies: Compensatory Strategies Compensatory strategies: Yes Straw: Effective   General Information: Caregiver present: No  Diet Prior to this Study: NPO   Temperature : Febrile   Respiratory Status: WFL   Supplemental O2: None (Room air)   History of Recent Intubation: Yes  Behavior/Cognition: Alert; Cooperative; Requires cueing Self-Feeding Abilities: Able to self-feed Baseline vocal quality/speech: Hypophonia/low volume Volitional Cough: Able to elicit Volitional Swallow: Able to elicit Exam Limitations: Limited visibility; Excessive movement Goal Planning: Prognosis for improved oropharyngeal function: Good No data recorded No data recorded Patient/Family Stated Goal: HOB reclined No data recorded Pain: Pain Assessment Pain Assessment: No/denies pain Pain Score: 8 Faces Pain Scale: 4 Breathing: 0 Negative Vocalization: 0 Facial Expression: 0 Body Language: 0 Consolability: 0 PAINAD Score: 0 Facial Expression: 0 Body Movements: 0 Muscle Tension: 0 Compliance with ventilator (intubated pts.): N/A Vocalization (extubated pts.): 0 CPOT Total: 0 Pain Location: abdomen Pain Descriptors / Indicators: Aching; Grimacing; Guarding Pain Intervention(s): Limited activity within patient's tolerance; Monitored during session; Repositioned End of Session: Start Time:SLP Start Time (ACUTE ONLY): 1610 Stop Time: SLP Stop Time  (ACUTE ONLY): 9604 Time Calculation:SLP Time Calculation (min) (ACUTE ONLY): 18 min Charges: SLP Evaluations $ SLP Speech Visit: 1 Visit SLP Evaluations $BSS Swallow: 1 Procedure $MBS Swallow: 1 Procedure $Swallowing Treatment: 1 Procedure SLP visit diagnosis: SLP Visit Diagnosis: Dysphagia, unspecified (R13.10) Past Medical History: Past Medical History: Diagnosis Date  CAD (coronary artery disease)   a. 01/2011 Anterior STEMI/Cath/PCI: LM nl, LAD 100d (3.5x50mm Vision BMS placed), LCX 63m, RI 50, RCA min irregs, EF 40% w/ apical, inferoapical HK; b. 10/2022 NSTSEMI/Cath: LM nl, LAD 5p/m, RI 85, LCX 125m, OM1 mild dzs, OM3 100 fills via L->L collats from dLAD, RCA 80p, 169m, RPDA fills via collats from LAD. EF 45-50%-->Med Rx.  Cardiac arrest - ventricular fibrillation   a. In setting of STEMI 01/2011  Chronic pain   Cocaine abuse (HCC)   Depression with anxiety   ETOH abuse   a. 6-12 beers/day  GERD (gastroesophageal reflux disease)   GI bleed   Hemorrhoids   Hyperlipidemia   Hypertension   Ischemic cardiomyopathy   a. 06/2011 Echo: EF 45-50%, No rwma; b. 10/2022 Echo: EF 55-60%, no rwma, nl RV fxn.  Marijuana abuse   Migraines   Tobacco abuse   a. 1/2 ppd x 26 yrs Past Surgical History: Past Surgical History: Procedure Laterality Date  COLONOSCOPY  08/09/2021  Procedure: COLONOSCOPY;  Surgeon: Midge Minium, MD;  Location: [facility] ENDOSCOPY;  Service: Endoscopy;;  CORONARY ANGIOPLASTY WITH STENT PLACEMENT    ESOPHAGEAL BANDING  04/08/2023  Procedure: ESOPHAGEAL BANDING;  Surgeon: Tressia Danas, MD;  Location: [facility] ENDOSCOPY;  Service: Gastroenterology;;  ESOPHAGOGASTRODUODENOSCOPY N/A 05/30/2022  Procedure: ESOPHAGOGASTRODUODENOSCOPY (EGD);  Surgeon: Toney Reil, MD;  Location: [facility] ENDOSCOPY;  Service: Gastroenterology;  Laterality: N/A;  ESOPHAGOGASTRODUODENOSCOPY (EGD) WITH PROPOFOL N/A 03/01/2021  Procedure: ESOPHAGOGASTRODUODENOSCOPY (EGD) WITH PROPOFOL;  Surgeon: Toney Reil, MD;  Location: [facility];  Service: Endoscopy;  Laterality: N/A;  ESOPHAGOGASTRODUODENOSCOPY (EGD) WITH PROPOFOL N/A 08/09/2021  Procedure: ESOPHAGOGASTRODUODENOSCOPY (EGD) WITH PROPOFOL;  Surgeon: Midge Minium, MD;  Location: [facility] ENDOSCOPY;  Service: Endoscopy;  Laterality: N/A;  ESOPHAGOGASTRODUODENOSCOPY (EGD) WITH PROPOFOL N/A 03/01/2023  Procedure: ESOPHAGOGASTRODUODENOSCOPY (EGD) WITH PROPOFOL;  Surgeon: Jaynie Collins, DO;  Location: [facility] ENDOSCOPY;  Service: Gastroenterology;  Laterality: N/A;  ESOPHAGOGASTRODUODENOSCOPY (EGD) WITH PROPOFOL N/A  04/08/2023  Procedure: ESOPHAGOGASTRODUODENOSCOPY (EGD) WITH PROPOFOL;  Surgeon: Tressia Danas, MD;  Location: [facility] ENDOSCOPY;  Service: Gastroenterology;  Laterality: N/A;  EVALUATION UNDER ANESTHESIA WITH HEMORRHOIDECTOMY N/A 10/31/2022  Procedure: EXAM UNDER ANESTHESIA WITH SUTURE LIGATION OF TWO BLEEDING PEDICLES;  Surgeon: Henrene Dodge, MD;  Location: [facility] ORS;  Service: General;  Laterality: N/A;  FLEXIBLE SIGMOIDOSCOPY N/A 10/02/2022  Procedure: FLEXIBLE SIGMOIDOSCOPY;  Surgeon: Meryl Dare, MD;  Location: [facility] ENDOSCOPY;  Service: Gastroenterology;  Laterality: N/A;  IR TIPS  04/11/2023  IR US GUIDE VASC ACCESS RIGHT  04/11/2023  IR US GUIDE VASC ACCESS RIGHT  04/11/2023  IR US GUIDE VASC ACCESS RIGHT  04/11/2023  LEFT HEART CATH AND CORONARY ANGIOGRAPHY N/A 11/04/2022  Procedure: LEFT HEART CATH AND CORONARY ANGIOGRAPHY;  Surgeon: Iran Ouch, MD;  Location: [facility] INVASIVE CV LAB;  Service: Cardiovascular;  Laterality: N/A;  RADIOLOGY WITH ANESTHESIA N/A 04/11/2023  Procedure: IR WITH ANESTHESIA;  Surgeon: Oley Balm, MD;  Location: [facility] OR;  Service: Radiology;  Laterality: N/A;  VISCERAL ANGIOGRAPHY N/A 10/24/2022  Procedure: VISCERAL ANGIOGRAPHY;  Surgeon: Annice Needy, MD;  Location: [facility] INVASIVE CV LAB;  Service: Cardiovascular;  Laterality: N/A; Amanda L. Samson Frederic, MA CCC/SLP Clinical Specialist - Acute Care SLP Acute Rehabilitation Services Office  number [phone] Blenda Mounts Laurice 04/21/2023, 9:57 AM  US Abdomen Complete  Result Date: 04/17/2023 CLINICAL DATA:  Abdominal pain. EXAM: ABDOMEN ULTRASOUND COMPLETE COMPARISON:  February 28, 2023. FINDINGS: Gallbladder: No gallstones or wall thickening visualized. No sonographic Murphy sign noted by sonographer. Mild amount of sludge is noted. Common bile duct:Not visualized. Liver: Slightly nodular hepatic margins are noted suggesting cirrhosis. No definite focal sonographic abnormality is noted. Portal vein is patent on color Doppler imaging with normal direction of blood flow towards the liver. IVC: No abnormality visualized. Pancreas: Not visualized due to overlying bowel gas. Spleen: Size and appearance within normal limits. Right Kidney: Length: 11.5 cm. Echogenicity within normal limits. No mass or hydronephrosis visualized. Left Kidney: Length: 11.3 cm. Echogenicity within normal limits. No mass or hydronephrosis visualized. Abdominal aorta: Not visualized. Other findings: None. IMPRESSION: Slightly nodular hepatic margins are noted suggesting hepatic cirrhosis. Common bile duct, pancreas and abdominal aorta were not visualized on this exam. Small amount of sludge noted within gallbladder. Electronically Signed   By: Lupita Raider M.D.   On: 04/17/2023 13:01   DG Chest Port 1 View  Result Date: 04/17/2023 CLINICAL DATA:  Abdominal pain and hypoxia.  [id number].  [id number]. EXAM: PORTABLE CHEST 1 VIEW PORTABLE SUPINE ABDOMEN, 2 FILMS COMPARISON:  Abdomen film earlier today at 11:04 a.m., portable chest 04/13/2023 FINDINGS: Portable chest at 11:31 p.m.: Prior NGT has been exchanged for a Dobbhoff feeding tube, which terminates in the gastric antrum in good position. Interstitial and faint patchy hazy opacities are noted in the lower lung fields but significantly improved. The upper lung fields have cleared. There is no substantial pleural effusion. Findings most likely indicating clearing edema and/or  pneumonia. The heart is enlarged. There is mild central vascular fullness with improvement. Mediastinum is normally outlined. Interval removal prior ETT, left IJ central line. Portable supine abdomen, 11:35 p.m., 2 films: Again noted is a TIPS stent in the liver. There is aeration of the intestinal tract without pathologic dilatation with colonic gas through to the rectum. No pathologic calcifications or acute radiographic findings are seen and no supine evidence of free air. There are degenerative changes of the spine. No significant osseous findings. IMPRESSION: 1. Dobbhoff feeding tube  terminates in the distal gastric antrum in good position. 2. Interstitial and faint patchy hazy opacities in the lower lung fields but significantly improved. Findings most likely indicating clearing edema and/or pneumonia. 3. Cardiomegaly. 4. Aeration throughout the intestinal tract, without pathologic dilatation. 5. TIPS stent in the liver. Electronically Signed   By: Almira Bar M.D.   On: 04/17/2023 00:07   DG Abd 1 View  Result Date: 04/17/2023 CLINICAL DATA:  Abdominal pain and hypoxia.  [id number].  [id number]. EXAM: PORTABLE CHEST 1 VIEW PORTABLE SUPINE ABDOMEN, 2 FILMS COMPARISON:  Abdomen film earlier today at 11:04 a.m., portable chest 04/13/2023 FINDINGS: Portable chest at 11:31 p.m.: Prior NGT has been exchanged for a Dobbhoff feeding tube, which terminates in the gastric antrum in good position. Interstitial and faint patchy hazy opacities are noted in the lower lung fields but significantly improved. The upper lung fields have cleared. There is no substantial pleural effusion. Findings most likely indicating clearing edema and/or pneumonia. The heart is enlarged. There is mild central vascular fullness with improvement. Mediastinum is normally outlined. Interval removal prior ETT, left IJ central line. Portable supine abdomen, 11:35 p.m., 2 films: Again noted is a TIPS stent in the liver. There is aeration of the  intestinal tract without pathologic dilatation with colonic gas through to the rectum. No pathologic calcifications or acute radiographic findings are seen and no supine evidence of free air. There are degenerative changes of the spine. No significant osseous findings. IMPRESSION: 1. Dobbhoff feeding tube terminates in the distal gastric antrum in good position. 2. Interstitial and faint patchy hazy opacities in the lower lung fields but significantly improved. Findings most likely indicating clearing edema and/or pneumonia. 3. Cardiomegaly. 4. Aeration throughout the intestinal tract, without pathologic dilatation. 5. TIPS stent in the liver. Electronically Signed   By: Almira Bar M.D.   On: 04/17/2023 00:07   DG Abd Portable 1V  Result Date: 04/16/2023 CLINICAL DATA:  Feeding tube placement EXAM: PORTABLE ABDOMEN - 1 VIEW limited for tube placement COMPARISON:  X-ray 04/15/2023 FINDINGS: Prior NG tube has been removed. New Dobbhoff tube with tip crossing to the right side of the abdomen, likely at the pylorus or proximal duodenum. Minimal slack in the stomach. Elsewhere note is made of a presumed tips shunt overlying the liver. Gaseous distention of bowel elsewhere in the visualized abdomen IMPRESSION: Limited x-ray for tube placement has a Dobbhoff tube with tip to the right of the abdomen. Likely pylorus or proximal duodenum Electronically Signed   By: Karen Kays M.D.   On: 04/16/2023 11:30   DG Abd Portable 1V  Result Date: 04/15/2023 CLINICAL DATA:  Nasogastric tube placement EXAM: PORTABLE ABDOMEN - 1 VIEW COMPARISON:  Portable exam 1415 hours compared to 04/12/2023 FINDINGS: 2 nasogastric tubes are identified, projecting over mid stomach and distal stomach. Air-filled nondistended colon. TIPS shunt RIGHT upper quadrant. Minimal atelectasis or infiltrate LEFT base. IMPRESSION: Two nasogastric tubes project over stomach as above. Electronically Signed   By: Ulyses Southward M.D.   On: 04/15/2023 14:30    DG CHEST PORT 1 VIEW  Result Date: 04/13/2023 CLINICAL DATA:  Fever. EXAM: PORTABLE CHEST 1 VIEW COMPARISON:  April 12, 2023 FINDINGS: Endotracheal tube in satisfactory position. Enteric catheter collimated off the image. Left-sided central venous catheter terminates at the cavoatrial juncture. Enlarged heart. Diffuse bilateral hazy and nodular opacities throughout both lungs. Osseous structures are without acute abnormality. Soft tissues are grossly normal. IMPRESSION: 1. Diffuse bilateral hazy and nodular opacities throughout both  lungs. No significant change from the prior radiograph. 2. Enlarged heart. 3. Support apparatus as described above. Electronically Signed   By: Ted Mcalpine M.D.   On: 04/13/2023 15:32   IR Tips  Result Date: 04/12/2023 CLINICAL DATA:  Hematemesis.  Upper GI bleed. Briefly, 52 year old male with history significant for EtOH cirrhosis and esophageal varices s/p EGD with banding 03/01/2023 and plan for interval TIPS. Patient represented to ER with upper GI bleed requiring urgent EGD and repeat banding 04/08/2023. MELD = 16 (04/11/2023). EXAM: Procedures: 1. TRANSJUGULAR INTRAHEPATIC PORTOSYSTEMIC SHUNT (TIPS) 2. INTRACARDIAC ECHOCARDIOGRAPHY (ICE) 3. DIRECT (PERCUTANEOUS) PORTAL VENOUS ACCESS Performing Physician: Roanna Banning, MD Assistant(s): Malachy Moan, MD A qualified trainee/resident or advanced practice provider (APP) was not immediately available to assist with this case. MEDICATIONS: As antibiotic prophylaxis, Vancomycin 1 gm IV was ordered pre-procedure and administered intravenously within one hour of incision. ANESTHESIA/SEDATION: Sedation by the Anesthesia Team was performed. Please see anesthesiology log for details. CONTRAST:  145 mL Omnipaque 300, intravenous FLUOROSCOPY TIME:  Fluoroscopic dose; 1273 mGy COMPLICATIONS: None immediate. PROCEDURE: Informed written consent was obtained from the patient and/or patient's representative after a thorough  discussion of the procedural risks, benefits and alternatives. All questions were addressed. Maximal Sterile Barrier Technique was utilized including caps, mask, sterile gowns, sterile gloves, sterile drape, hand hygiene and skin antiseptic. A timeout was performed prior to the initiation of the procedure. Initial ultrasound scanning demonstrates a small amount of ascites within the RIGHT abdomen. An image was saved for documentation purposes. Paracentesis was NOT performed. A preliminary ultrasound of the RIGHT groin was performed and demonstrates a patent RIGHT common femoral vein. A permanent ultrasound image was recorded. Using a combination of fluoroscopy and ultrasound, an access site was determined. A small dermatotomy was made at the planned puncture site. Using ultrasound guidance, access into the RIGHT greater saphenous vein was obtained with visualization of needle entry into the vessel using a standard micropuncture technique. A wire was advanced into the IVC insert all fascial dilation performed. An 8 Fr, 11 cm vascular sheath was placed into the external iliac vein. Through this access site, an 8 Fr AcuNav ICE catheter was advanced with ease under fluoroscopic guidance to the level of the intrahepatic inferior vena cava. A preliminary ultrasound of the RIGHT neck was performed and demonstrates a patent internal jugular vein. A permanent ultrasound image was recorded. Using a combination of fluoroscopy and ultrasound, an access site was determined. A small dermatotomy was made at the planned puncture site. Using ultrasound guidance, access into the RIGHT internal jugular vein was obtained with visualization of needle entry into the vessel using a standard micropuncture technique. A wire was advanced into the IVC and serial fascial dilation performed. A 10 Fr 35 cm Cook TIPS sheath was placed into the internal jugular vein and advanced to the IVC. The jugular sheath was retracted into the RIGHT atrium  and manometry was performed. HEPATIC VENOGRAM: A 5 Fr angled tip catheter was then directed into the RIGHT hepatic vein. Hepatic venogram was performed. These images demonstrated patent variant anatomy venous an accessory, inferior RIGHT hepatic vein at the level of the caudate. No evidence of the hepatic venous stenosis. The catheter was advanced to a wedge portion of the a patent vein over which the sheath was advanced into the distal hepatic vein. Using ICE ultrasound visualization the catheter as RIGHT hepatic vein as well as the portal anatomy was defined. Under direct ultrasound visualization, the Argon Scorpion X needle was  advanced into the central RIGHT portal vein. Despite multiple attempts, the hepatic vein and portal vein could not be placed into a single sonographic plane and there was an adequate purchase for transhepatic access. ULTRASOUND-GUIDED PORTAL VENOUS ACCESS: Evaluation of the RIGHT upper quadrant showed a patent portal vein. Under ultrasound guidance, a 15 cm 21-G needle was inserted into the portal vein. A 0.018 inch Nitinol guidewire was advanced through the needle. Hand injection of contrast confirmed position within the portal system. The track was dilated using a transitional dilator set, a 0.035 inch wire was inserted into the portal vein and over the wire a sheath was inserted. A snare was inserted and advanced to the level of the portal vein. "GUN SIGHT" TIPS SHUNT CREATION: A snare was also advanced through the transjugular sheath into the RIGHT hepatic vein. With meticulous triangulation, an oblique view was obtained which overlaid the snares upon each other and a 15 cm 21-gauge needle was advanced via direct/percutaneous/transhepatic approach through the snares. A perpendicular view was obtained which demonstrated that the needle was through both snares. A 0.018 inch guidewire was advanced through the needle into the RIGHT hepatic vein, snared, and pulled through the jugular  access site. Using a CXI crossing catheter, portal venous access was obtained through the hepatic vein. Hand injection of contrast confirmed position within the portal system. A Glidewire was then advanced into the splenic vein. A 5 Fr marking pigtail catheter was then advanced over the wire into the main portal vein and wire removed. Portal venogram was performed which demonstrated a patent portal vein. The tract was then pre-dilated with 8 mm x 10 mm Athletis balloon. A 8-10 mm by 7 + 2 cm of GORE Viatorr TIPS stent was placed. After placement of the shunt, RIGHT atrial and portal manometry was performed. Completion portal venogram demonstrated a patent TIPS endograft without residual filling of the gastroesophageal varices. The catheters and sheath were removed and manual compression was applied to the RIGHT internal jugular and RIGHT common femoral venous access sites until hemostasis was achieved. The patient was transferred back to the ICU in stable condition. Post-TIPS Mean Pressures (mmHg): Right atrium:23 Portal vein: 30 Portosystemic gradient: 7 FINDINGS: *Access via the RIGHT jugular and greater saphenous veins. *Aborted ICE-guided TIPS attempt secondary to challenging venous anatomy. Accessory RIGHT hepatic vein the caudate without adequate purchase for transhepatic access. *Successful direct portal venous access and TIPS creation, via the accessory RIGHT hepatic vein to the RIGHT portal vein, with placement of a 7 cm GORE Viatorr stent. *Small volume perihepatic ascites. Paracentesis was NOT performed. IMPRESSION: 1. Successful creation of Transjugular Intrahepatic Portosystemic Shunt (TIPS) via 'Gun sight' technique and direct/percutaneous portal venous access, under ultrasound and fluoroscopy. 2. Reduction of portal hypertension with a completion portosystemic gradient of 7 mm Hg. 3. No residual opacification of gastroesophageal varices. No variceal embolization was required or performed. PLAN:  *Lactulose TID and continue to evaluate/monitor for hepatic encephalopathy. *The patient will be enrolled in the VIR portal hypertension follow-up clinic, and will be closely followed per the protocol. Roanna Banning, MD [facility] Radiology Electronically Signed   By: Roanna Banning M.D.   On: 04/12/2023 12:33   IR US Guide Vasc Access Right  Result Date: 04/12/2023 CLINICAL DATA:  Hematemesis.  Upper GI bleed. Briefly, 52 year old male with history significant for EtOH cirrhosis and esophageal varices s/p EGD with banding 03/01/2023 and plan for interval TIPS. Patient represented to ER with upper GI bleed requiring  urgent EGD and repeat banding 04/08/2023. MELD = 16 (04/11/2023). EXAM: Procedures: 1. TRANSJUGULAR INTRAHEPATIC PORTOSYSTEMIC SHUNT (TIPS) 2. INTRACARDIAC ECHOCARDIOGRAPHY (ICE) 3. DIRECT (PERCUTANEOUS) PORTAL VENOUS ACCESS Performing Physician: Roanna Banning, MD Assistant(s): Malachy Moan, MD A qualified trainee/resident or advanced practice provider (APP) was not immediately available to assist with this case. MEDICATIONS: As antibiotic prophylaxis, Vancomycin 1 gm IV was ordered pre-procedure and administered intravenously within one hour of incision. ANESTHESIA/SEDATION: Sedation by the Anesthesia Team was performed. Please see anesthesiology log for details. CONTRAST:  145 mL Omnipaque 300, intravenous FLUOROSCOPY TIME:  Fluoroscopic dose; 1273 mGy COMPLICATIONS: None immediate. PROCEDURE: Informed written consent was obtained from the patient and/or patient's representative after a thorough discussion of the procedural risks, benefits and alternatives. All questions were addressed. Maximal Sterile Barrier Technique was utilized including caps, mask, sterile gowns, sterile gloves, sterile drape, hand hygiene and skin antiseptic. A timeout was performed prior to the initiation of the procedure. Initial ultrasound scanning demonstrates a small  amount of ascites within the RIGHT abdomen. An image was saved for documentation purposes. Paracentesis was NOT performed. A preliminary ultrasound of the RIGHT groin was performed and demonstrates a patent RIGHT common femoral vein. A permanent ultrasound image was recorded. Using a combination of fluoroscopy and ultrasound, an access site was determined. A small dermatotomy was made at the planned puncture site. Using ultrasound guidance, access into the RIGHT greater saphenous vein was obtained with visualization of needle entry into the vessel using a standard micropuncture technique. A wire was advanced into the IVC insert all fascial dilation performed. An 8 Fr, 11 cm vascular sheath was placed into the external iliac vein. Through this access site, an 8 Fr AcuNav ICE catheter was advanced with ease under fluoroscopic guidance to the level of the intrahepatic inferior vena cava. A preliminary ultrasound of the RIGHT neck was performed and demonstrates a patent internal jugular vein. A permanent ultrasound image was recorded. Using a combination of fluoroscopy and ultrasound, an access site was determined. A small dermatotomy was made at the planned puncture site. Using ultrasound guidance, access into the RIGHT internal jugular vein was obtained with visualization of needle entry into the vessel using a standard micropuncture technique. A wire was advanced into the IVC and serial fascial dilation performed. A 10 Fr 35 cm Cook TIPS sheath was placed into the internal jugular vein and advanced to the IVC. The jugular sheath was retracted into the RIGHT atrium and manometry was performed. HEPATIC VENOGRAM: A 5 Fr angled tip catheter was then directed into the RIGHT hepatic vein. Hepatic venogram was performed. These images demonstrated patent variant anatomy venous an accessory, inferior RIGHT hepatic vein at the level of the caudate. No evidence of the hepatic venous stenosis. The catheter was advanced to a  wedge portion of the a patent vein over which the sheath was advanced into the distal hepatic vein. Using ICE ultrasound visualization the catheter as RIGHT hepatic vein as well as the portal anatomy was defined. Under direct ultrasound visualization, the Argon Scorpion X needle was advanced into the central RIGHT portal vein. Despite multiple attempts, the hepatic vein and portal vein could not be placed into a single sonographic plane and there was an adequate purchase for transhepatic access. ULTRASOUND-GUIDED PORTAL VENOUS ACCESS: Evaluation of the RIGHT upper quadrant showed a patent portal vein. Under ultrasound guidance, a 15 cm 21-G needle was inserted into the portal vein. A 0.018 inch Nitinol guidewire was advanced through the needle. Hand injection of contrast confirmed  position within the portal system. The track was dilated using a transitional dilator set, a 0.035 inch wire was inserted into the portal vein and over the wire a sheath was inserted. A snare was inserted and advanced to the level of the portal vein. "GUN SIGHT" TIPS SHUNT CREATION: A snare was also advanced through the transjugular sheath into the RIGHT hepatic vein. With meticulous triangulation, an oblique view was obtained which overlaid the snares upon each other and a 15 cm 21-gauge needle was advanced via direct/percutaneous/transhepatic approach through the snares. A perpendicular view was obtained which demonstrated that the needle was through both snares. A 0.018 inch guidewire was advanced through the needle into the RIGHT hepatic vein, snared, and pulled through the jugular access site. Using a CXI crossing catheter, portal venous access was obtained through the hepatic vein. Hand injection of contrast confirmed position within the portal system. A Glidewire was then advanced into the splenic vein. A 5 Fr marking pigtail catheter was then advanced over the wire into the main portal vein and wire removed. Portal venogram was  performed which demonstrated a patent portal vein. The tract was then pre-dilated with 8 mm x 10 mm Athletis balloon. A 8-10 mm by 7 + 2 cm of GORE Viatorr TIPS stent was placed. After placement of the shunt, RIGHT atrial and portal manometry was performed. Completion portal venogram demonstrated a patent TIPS endograft without residual filling of the gastroesophageal varices. The catheters and sheath were removed and manual compression was applied to the RIGHT internal jugular and RIGHT common femoral venous access sites until hemostasis was achieved. The patient was transferred back to the ICU in stable condition. Post-TIPS Mean Pressures (mmHg): Right atrium:23 Portal vein: 30 Portosystemic gradient: 7 FINDINGS: *Access via the RIGHT jugular and greater saphenous veins. *Aborted ICE-guided TIPS attempt secondary to challenging venous anatomy. Accessory RIGHT hepatic vein the caudate without adequate purchase for transhepatic access. *Successful direct portal venous access and TIPS creation, via the accessory RIGHT hepatic vein to the RIGHT portal vein, with placement of a 7 cm GORE Viatorr stent. *Small volume perihepatic ascites. Paracentesis was NOT performed. IMPRESSION: 1. Successful creation of Transjugular Intrahepatic Portosystemic Shunt (TIPS) via 'Gun sight' technique and direct/percutaneous portal venous access, under ultrasound and fluoroscopy. 2. Reduction of portal hypertension with a completion portosystemic gradient of 7 mm Hg. 3. No residual opacification of gastroesophageal varices. No variceal embolization was required or performed. PLAN: *Lactulose TID and continue to evaluate/monitor for hepatic encephalopathy. *The patient will be enrolled in the VIR portal hypertension follow-up clinic, and will be closely followed per the protocol. Roanna Banning, MD [facility] Radiology Electronically Signed   By: Roanna Banning M.D.   On: 04/12/2023 12:33    IR US Guide Vasc Access Right  Result Date: 04/12/2023 CLINICAL DATA:  Hematemesis.  Upper GI bleed. Briefly, 52 year old male with history significant for EtOH cirrhosis and esophageal varices s/p EGD with banding 03/01/2023 and plan for interval TIPS. Patient represented to ER with upper GI bleed requiring urgent EGD and repeat banding 04/08/2023. MELD = 16 (04/11/2023). EXAM: Procedures: 1. TRANSJUGULAR INTRAHEPATIC PORTOSYSTEMIC SHUNT (TIPS) 2. INTRACARDIAC ECHOCARDIOGRAPHY (ICE) 3. DIRECT (PERCUTANEOUS) PORTAL VENOUS ACCESS Performing Physician: Roanna Banning, MD Assistant(s): Malachy Moan, MD A qualified trainee/resident or advanced practice provider (APP) was not immediately available to assist with this case. MEDICATIONS: As antibiotic prophylaxis, Vancomycin 1 gm IV was ordered pre-procedure and administered intravenously within one hour of incision. ANESTHESIA/SEDATION: Sedation by the Anesthesia  Team was performed. Please see anesthesiology log for details. CONTRAST:  145 mL Omnipaque 300, intravenous FLUOROSCOPY TIME:  Fluoroscopic dose; 1273 mGy COMPLICATIONS: None immediate. PROCEDURE: Informed written consent was obtained from the patient and/or patient's representative after a thorough discussion of the procedural risks, benefits and alternatives. All questions were addressed. Maximal Sterile Barrier Technique was utilized including caps, mask, sterile gowns, sterile gloves, sterile drape, hand hygiene and skin antiseptic. A timeout was performed prior to the initiation of the procedure. Initial ultrasound scanning demonstrates a small amount of ascites within the RIGHT abdomen. An image was saved for documentation purposes. Paracentesis was NOT performed. A preliminary ultrasound of the RIGHT groin was performed and demonstrates a patent RIGHT common femoral vein. A permanent ultrasound image was recorded. Using a combination of fluoroscopy and ultrasound, an access site was determined. A  small dermatotomy was made at the planned puncture site. Using ultrasound guidance, access into the RIGHT greater saphenous vein was obtained with visualization of needle entry into the vessel using a standard micropuncture technique. A wire was advanced into the IVC insert all fascial dilation performed. An 8 Fr, 11 cm vascular sheath was placed into the external iliac vein. Through this access site, an 8 Fr AcuNav ICE catheter was advanced with ease under fluoroscopic guidance to the level of the intrahepatic inferior vena cava. A preliminary ultrasound of the RIGHT neck was performed and demonstrates a patent internal jugular vein. A permanent ultrasound image was recorded. Using a combination of fluoroscopy and ultrasound, an access site was determined. A small dermatotomy was made at the planned puncture site. Using ultrasound guidance, access into the RIGHT internal jugular vein was obtained with visualization of needle entry into the vessel using a standard micropuncture technique. A wire was advanced into the IVC and serial fascial dilation performed. A 10 Fr 35 cm Cook TIPS sheath was placed into the internal jugular vein and advanced to the IVC. The jugular sheath was retracted into the RIGHT atrium and manometry was performed. HEPATIC VENOGRAM: A 5 Fr angled tip catheter was then directed into the RIGHT hepatic vein. Hepatic venogram was performed. These images demonstrated patent variant anatomy venous an accessory, inferior RIGHT hepatic vein at the level of the caudate. No evidence of the hepatic venous stenosis. The catheter was advanced to a wedge portion of the a patent vein over which the sheath was advanced into the distal hepatic vein. Using ICE ultrasound visualization the catheter as RIGHT hepatic vein as well as the portal anatomy was defined. Under direct ultrasound visualization, the Argon Scorpion X needle was advanced into the central RIGHT portal vein. Despite multiple attempts, the  hepatic vein and portal vein could not be placed into a single sonographic plane and there was an adequate purchase for transhepatic access. ULTRASOUND-GUIDED PORTAL VENOUS ACCESS: Evaluation of the RIGHT upper quadrant showed a patent portal vein. Under ultrasound guidance, a 15 cm 21-G needle was inserted into the portal vein. A 0.018 inch Nitinol guidewire was advanced through the needle. Hand injection of contrast confirmed position within the portal system. The track was dilated using a transitional dilator set, a 0.035 inch wire was inserted into the portal vein and over the wire a sheath was inserted. A snare was inserted and advanced to the level of the portal vein. "GUN SIGHT" TIPS SHUNT CREATION: A snare was also advanced through the transjugular sheath into the RIGHT hepatic vein. With meticulous triangulation, an oblique view was obtained which overlaid the snares upon each  other and a 15 cm 21-gauge needle was advanced via direct/percutaneous/transhepatic approach through the snares. A perpendicular view was obtained which demonstrated that the needle was through both snares. A 0.018 inch guidewire was advanced through the needle into the RIGHT hepatic vein, snared, and pulled through the jugular access site. Using a CXI crossing catheter, portal venous access was obtained through the hepatic vein. Hand injection of contrast confirmed position within the portal system. A Glidewire was then advanced into the splenic vein. A 5 Fr marking pigtail catheter was then advanced over the wire into the main portal vein and wire removed. Portal venogram was performed which demonstrated a patent portal vein. The tract was then pre-dilated with 8 mm x 10 mm Athletis balloon. A 8-10 mm by 7 + 2 cm of GORE Viatorr TIPS stent was placed. After placement of the shunt, RIGHT atrial and portal manometry was performed. Completion portal venogram demonstrated a patent TIPS endograft without residual filling of the  gastroesophageal varices. The catheters and sheath were removed and manual compression was applied to the RIGHT internal jugular and RIGHT common femoral venous access sites until hemostasis was achieved. The patient was transferred back to the ICU in stable condition. Post-TIPS Mean Pressures (mmHg): Right atrium:23 Portal vein: 30 Portosystemic gradient: 7 FINDINGS: *Access via the RIGHT jugular and greater saphenous veins. *Aborted ICE-guided TIPS attempt secondary to challenging venous anatomy. Accessory RIGHT hepatic vein the caudate without adequate purchase for transhepatic access. *Successful direct portal venous access and TIPS creation, via the accessory RIGHT hepatic vein to the RIGHT portal vein, with placement of a 7 cm GORE Viatorr stent. *Small volume perihepatic ascites. Paracentesis was NOT performed. IMPRESSION: 1. Successful creation of Transjugular Intrahepatic Portosystemic Shunt (TIPS) via 'Gun sight' technique and direct/percutaneous portal venous access, under ultrasound and fluoroscopy. 2. Reduction of portal hypertension with a completion portosystemic gradient of 7 mm Hg. 3. No residual opacification of gastroesophageal varices. No variceal embolization was required or performed. PLAN: *Lactulose TID and continue to evaluate/monitor for hepatic encephalopathy. *The patient will be enrolled in the VIR portal hypertension follow-up clinic, and will be closely followed per the protocol. Roanna Banning, MD [facility] Radiology Electronically Signed   By: Roanna Banning M.D.   On: 04/12/2023 12:33   IR US Guide Vasc Access Right  Result Date: 04/12/2023 CLINICAL DATA:  Hematemesis.  Upper GI bleed. Briefly, 52 year old male with history significant for EtOH cirrhosis and esophageal varices s/p EGD with banding 03/01/2023 and plan for interval TIPS. Patient represented to ER with upper GI bleed requiring urgent EGD and repeat banding 04/08/2023.  MELD = 16 (04/11/2023). EXAM: Procedures: 1. TRANSJUGULAR INTRAHEPATIC PORTOSYSTEMIC SHUNT (TIPS) 2. INTRACARDIAC ECHOCARDIOGRAPHY (ICE) 3. DIRECT (PERCUTANEOUS) PORTAL VENOUS ACCESS Performing Physician: Roanna Banning, MD Assistant(s): Malachy Moan, MD A qualified trainee/resident or advanced practice provider (APP) was not immediately available to assist with this case. MEDICATIONS: As antibiotic prophylaxis, Vancomycin 1 gm IV was ordered pre-procedure and administered intravenously within one hour of incision. ANESTHESIA/SEDATION: Sedation by the Anesthesia Team was performed. Please see anesthesiology log for details. CONTRAST:  145 mL Omnipaque 300, intravenous FLUOROSCOPY TIME:  Fluoroscopic dose; 1273 mGy COMPLICATIONS: None immediate. PROCEDURE: Informed written consent was obtained from the patient and/or patient's representative after a thorough discussion of the procedural risks, benefits and alternatives. All questions were addressed. Maximal Sterile Barrier Technique was utilized including caps, mask, sterile gowns, sterile gloves, sterile drape, hand hygiene and skin antiseptic. A timeout was performed prior  to the initiation of the procedure. Initial ultrasound scanning demonstrates a small amount of ascites within the RIGHT abdomen. An image was saved for documentation purposes. Paracentesis was NOT performed. A preliminary ultrasound of the RIGHT groin was performed and demonstrates a patent RIGHT common femoral vein. A permanent ultrasound image was recorded. Using a combination of fluoroscopy and ultrasound, an access site was determined. A small dermatotomy was made at the planned puncture site. Using ultrasound guidance, access into the RIGHT greater saphenous vein was obtained with visualization of needle entry into the vessel using a standard micropuncture technique. A wire was advanced into the IVC insert all fascial dilation performed. An 8 Fr, 11 cm vascular sheath was placed into the  external iliac vein. Through this access site, an 8 Fr AcuNav ICE catheter was advanced with ease under fluoroscopic guidance to the level of the intrahepatic inferior vena cava. A preliminary ultrasound of the RIGHT neck was performed and demonstrates a patent internal jugular vein. A permanent ultrasound image was recorded. Using a combination of fluoroscopy and ultrasound, an access site was determined. A small dermatotomy was made at the planned puncture site. Using ultrasound guidance, access into the RIGHT internal jugular vein was obtained with visualization of needle entry into the vessel using a standard micropuncture technique. A wire was advanced into the IVC and serial fascial dilation performed. A 10 Fr 35 cm Cook TIPS sheath was placed into the internal jugular vein and advanced to the IVC. The jugular sheath was retracted into the RIGHT atrium and manometry was performed. HEPATIC VENOGRAM: A 5 Fr angled tip catheter was then directed into the RIGHT hepatic vein. Hepatic venogram was performed. These images demonstrated patent variant anatomy venous an accessory, inferior RIGHT hepatic vein at the level of the caudate. No evidence of the hepatic venous stenosis. The catheter was advanced to a wedge portion of the a patent vein over which the sheath was advanced into the distal hepatic vein. Using ICE ultrasound visualization the catheter as RIGHT hepatic vein as well as the portal anatomy was defined. Under direct ultrasound visualization, the Argon Scorpion X needle was advanced into the central RIGHT portal vein. Despite multiple attempts, the hepatic vein and portal vein could not be placed into a single sonographic plane and there was an adequate purchase for transhepatic access. ULTRASOUND-GUIDED PORTAL VENOUS ACCESS: Evaluation of the RIGHT upper quadrant showed a patent portal vein. Under ultrasound guidance, a 15 cm 21-G needle was inserted into the portal vein. A 0.018 inch Nitinol guidewire  was advanced through the needle. Hand injection of contrast confirmed position within the portal system. The track was dilated using a transitional dilator set, a 0.035 inch wire was inserted into the portal vein and over the wire a sheath was inserted. A snare was inserted and advanced to the level of the portal vein. "GUN SIGHT" TIPS SHUNT CREATION: A snare was also advanced through the transjugular sheath into the RIGHT hepatic vein. With meticulous triangulation, an oblique view was obtained which overlaid the snares upon each other and a 15 cm 21-gauge needle was advanced via direct/percutaneous/transhepatic approach through the snares. A perpendicular view was obtained which demonstrated that the needle was through both snares. A 0.018 inch guidewire was advanced through the needle into the RIGHT hepatic vein, snared, and pulled through the jugular access site. Using a CXI crossing catheter, portal venous access was obtained through the hepatic vein. Hand injection of contrast confirmed position within the portal system. A Glidewire was  then advanced into the splenic vein. A 5 Fr marking pigtail catheter was then advanced over the wire into the main portal vein and wire removed. Portal venogram was performed which demonstrated a patent portal vein. The tract was then pre-dilated with 8 mm x 10 mm Athletis balloon. A 8-10 mm by 7 + 2 cm of GORE Viatorr TIPS stent was placed. After placement of the shunt, RIGHT atrial and portal manometry was performed. Completion portal venogram demonstrated a patent TIPS endograft without residual filling of the gastroesophageal varices. The catheters and sheath were removed and manual compression was applied to the RIGHT internal jugular and RIGHT common femoral venous access sites until hemostasis was achieved. The patient was transferred back to the ICU in stable condition. Post-TIPS Mean Pressures (mmHg): Right atrium:23 Portal vein: 30 Portosystemic gradient: 7 FINDINGS:  *Access via the RIGHT jugular and greater saphenous veins. *Aborted ICE-guided TIPS attempt secondary to challenging venous anatomy. Accessory RIGHT hepatic vein the caudate without adequate purchase for transhepatic access. *Successful direct portal venous access and TIPS creation, via the accessory RIGHT hepatic vein to the RIGHT portal vein, with placement of a 7 cm GORE Viatorr stent. *Small volume perihepatic ascites. Paracentesis was NOT performed. IMPRESSION: 1. Successful creation of Transjugular Intrahepatic Portosystemic Shunt (TIPS) via 'Gun sight' technique and direct/percutaneous portal venous access, under ultrasound and fluoroscopy. 2. Reduction of portal hypertension with a completion portosystemic gradient of 7 mm Hg. 3. No residual opacification of gastroesophageal varices. No variceal embolization was required or performed. PLAN: *Lactulose TID and continue to evaluate/monitor for hepatic encephalopathy. *The patient will be enrolled in the VIR portal hypertension follow-up clinic, and will be closely followed per the protocol. Roanna Banning, MD [facility] Radiology Electronically Signed   By: Roanna Banning M.D.   On: 04/12/2023 12:33   DG Abd Portable 1V  Result Date: 04/12/2023 CLINICAL DATA:  Tube placement EXAM: PORTABLE ABDOMEN - 1 VIEW COMPARISON:  02/28/2023 FINDINGS: Image of the upper abdomen demonstrates N/OG tube tip superimposed with the stomach below the diaphragm. IMPRESSION: N/OG tube in place. Electronically Signed   By: Layla Maw M.D.   On: 04/12/2023 11:06   Portable Chest x-ray  Result Date: 04/12/2023 CLINICAL DATA:  Check endotracheal tube placement EXAM: PORTABLE CHEST 1 VIEW COMPARISON:  04/11/2023 FINDINGS: Left jugular central line is again noted in the mid superior vena cava. Endotracheal tube is seen 3.2 cm above the carina. Cardiac shadow is enlarged but stable. Diffuse increased airspace opacity is noted  bilaterally most consistent with worsening edema. No pneumothorax is seen. No bony abnormality is noted. IMPRESSION: Endotracheal tube in satisfactory position. Worsening pulmonary edema. Electronically Signed   By: Alcide Clever M.D.   On: 04/12/2023 03:35   ECHOCARDIOGRAM COMPLETE  Result Date: 04/11/2023    ECHOCARDIOGRAM REPORT   Patient Name:   Ethan Wells Date of Exam: 04/11/2023 Medical Rec #:  [id number]           Height:       65.0 in Accession #:    [id number]          Weight:       197.3 lb Date of Birth:  Feb 10, 1971           BSA:          1.967 m Patient Age:    51 years            BP:  115/68 mmHg Patient Gender: M                   HR:           89 bpm. Exam Location:  Inpatient Procedure: 2D Echo, Cardiac Doppler, Color Doppler and Intracardiac            Opacification Agent Indications:    Other abnormalities of the heart R00.8  History:        Patient has prior history of Echocardiogram examinations. CAD;                 Risk Factors:Hypertension and Dyslipidemia.  Sonographer:    Mike Gip Referring Phys: [phone] JENNIFER C OMOHUNDRO IMPRESSIONS  1. Left ventricular ejection fraction, by estimation, is 55 to 60%. The left ventricle has normal function. The left ventricle has no regional wall motion abnormalities. There is mild left ventricular hypertrophy. Left ventricular diastolic parameters are consistent with Grade II diastolic dysfunction (pseudonormalization).  2. Right ventricular systolic function is normal. The right ventricular size is normal. There is normal pulmonary artery systolic pressure.  3. Anterior leaflet has increased thickening, likely calcification. Mild mitral valve regurgitation.  4. The aortic valve is tricuspid. Aortic valve regurgitation is not visualized.  5. The inferior vena cava is normal in size with greater than 50% respiratory variability, suggesting right atrial pressure of 3 mmHg. FINDINGS  Left Ventricle: Left ventricular ejection fraction,  by estimation, is 55 to 60%. The left ventricle has normal function. The left ventricle has no regional wall motion abnormalities. Definity contrast agent was given IV to delineate the left ventricular  endocardial borders. The left ventricular internal cavity size was normal in size. There is mild left ventricular hypertrophy. Left ventricular diastolic parameters are consistent with Grade II diastolic dysfunction (pseudonormalization). Right Ventricle: The right ventricular size is normal. Right ventricular systolic function is normal. There is normal pulmonary artery systolic pressure. The tricuspid regurgitant velocity is 2.54 m/s, and with an assumed right atrial pressure of 3 mmHg,  the estimated right ventricular systolic pressure is 28.8 mmHg. Left Atrium: Left atrial size was normal in size. Right Atrium: Right atrial size was normal in size. Pericardium: There is no evidence of pericardial effusion. Mitral Valve: Anterior leaflet has increased thickening, likely calcification. Mild mitral valve regurgitation. Tricuspid Valve: Tricuspid valve regurgitation is mild. Aortic Valve: The aortic valve is tricuspid. Aortic valve regurgitation is not visualized. Pulmonic Valve: Pulmonic valve regurgitation is not visualized. Aorta: The aortic root and ascending aorta are structurally normal, with no evidence of dilitation. Venous: The inferior vena cava is normal in size with greater than 50% respiratory variability, suggesting right atrial pressure of 3 mmHg. IAS/Shunts: No atrial level shunt detected by color flow Doppler.  LEFT VENTRICLE PLAX 2D LVIDd:         5.80 cm      Diastology LVIDs:         4.60 cm      LV e' medial:    4.35 cm/s LV PW:         1.10 cm      LV E/e' medial:  22.0 LV IVS:        1.00 cm      LV e' lateral:   8.59 cm/s LVOT diam:     2.20 cm      LV E/e' lateral: 11.1 LV SV:         87 LV SV Index:   44 LVOT Area:  3.80 cm  LV Volumes (MOD) LV vol d, MOD A2C: 156.0 ml LV vol d, MOD A4C:  149.0 ml LV vol s, MOD A2C: 69.6 ml LV vol s, MOD A4C: 68.0 ml LV SV MOD A2C:     86.4 ml LV SV MOD A4C:     149.0 ml LV SV MOD BP:      87.6 ml RIGHT VENTRICLE             IVC RV Basal diam:  4.20 cm     IVC diam: 1.80 cm RV S prime:     12.30 cm/s TAPSE (M-mode): 2.0 cm LEFT ATRIUM             Index        RIGHT ATRIUM           Index LA diam:        3.90 cm 1.98 cm/m   RA Area:     20.40 cm LA Vol (A2C):   66.0 ml 33.56 ml/m  RA Volume:   55.90 ml  28.42 ml/m LA Vol (A4C):   59.3 ml 30.15 ml/m LA Biplane Vol: 62.5 ml 31.78 ml/m  AORTIC VALVE LVOT Vmax:   102.00 cm/s LVOT Vmean:  68.700 cm/s LVOT VTI:    0.229 m  AORTA Ao Root diam: 3.30 cm Ao Asc diam:  3.10 cm MITRAL VALVE               TRICUSPID VALVE MV Area (PHT): 4.60 cm    TR Peak grad:   25.8 mmHg MV Decel Time: 165 msec    TR Vmax:        254.00 cm/s MV E velocity: 95.50 cm/s MV A velocity: 32.60 cm/s  SHUNTS MV E/A ratio:  2.93        Systemic VTI:  0.23 m                            Systemic Diam: 2.20 cm Carolan Clines Electronically signed by Carolan Clines Signature Date/Time: 04/11/2023/10:06:14 AM    Final    DG Chest Port 1 View  Result Date: 04/11/2023 CLINICAL DATA:  5626 Acute respiratory failure 5626 EXAM: PORTABLE CHEST 1 VIEW COMPARISON:  Chest XR, 04/09/2023.  CT AP, 01/17/2023. FINDINGS: Support lines: Interval extubation. LEFT IJ CVC with catheter tip well-positioned at the superior cavoatrial junction. Cardiomegaly. Perihilar and central bronchial wall thickening. Minimal bibasilar interstitial opacities. No pleural effusion or pneumothorax. No interval osseous abnormality. IMPRESSION: 1. Extubation.  Stable, well-positioned LEFT IJ-approach CVC. 2. Cardiomegaly with perihilar thickening and minimal basilar opacities. Findings favored to represent mild pulmonary edema and atelectasis. Electronically Signed   By: Roanna Banning M.D.   On: 04/11/2023 07:30   DG Chest Port 1 View  Result Date: 04/09/2023 CLINICAL DATA:  Hypoxia EXAM:  PORTABLE CHEST 1 VIEW COMPARISON:  04/09/2023 FINDINGS: Endotracheal tube is again noted and stable. Left jugular central line are again seen in satisfactory position. Significant improvement in the degree of congestive failure is noted. Some residual basilar opacities are seen. No bony abnormality is noted. IMPRESSION: Near complete resolution of previously seen pulmonary edema. Electronically Signed   By: Alcide Clever M.D.   On: 04/09/2023 12:12   DG CHEST PORT 1 VIEW  Result Date: 04/09/2023 CLINICAL DATA:  Respiratory failure, prone repositioning EXAM: PORTABLE CHEST 1 VIEW COMPARISON:  04/08/2023 FINDINGS: Endotracheal tube is seen 14 mm above the carina. Left internal jugular central  venous catheter tip noted overlying the expected superior vena cava. Pulmonary insufflation is stable. Superimposed extensive bilateral perihilar and lower lung zone consolidation in keeping with severe alveolar pulmonary edema or diffuse pneumonic infiltrate has progressed in the interval since prior examination. No pneumothorax or pleural effusion. Cardiac size within normal limits. IMPRESSION: 1. Endotracheal tube 14 mm above the carina. 2. Progressive bilateral perihilar and lower lung zone consolidation, severe alveolar pulmonary edema or diffuse pneumonic infiltrate. Electronically Signed   By: Helyn Numbers M.D.   On: 04/09/2023 03:01   DG CHEST PORT 1 VIEW  Result Date: 04/08/2023 CLINICAL DATA:  Hypoxia EXAM: PORTABLE CHEST 1 VIEW COMPARISON:  04/07/2023 FINDINGS: The endotracheal tube has been placed with tip measuring 3.8 cm above the carina. Left central venous catheter with tip over the low SVC region. No pneumothorax. Cardiac enlargement with bilateral perihilar infiltrates, progressing since prior study, likely edema. No pleural effusions. No pneumothorax. Mediastinal contours appear intact. IMPRESSION: 1. Appliances appear in satisfactory position. 2. Cardiac enlargement with perihilar infiltration  increasing since prior study, likely edema. Electronically Signed   By: Burman Nieves M.D.   On: 04/08/2023 22:04     Assessment/Plan 1.  Mesenteric stenosis:   The patient seems to be improved overall.  However, he continues to complain of nonspecific abdominal pain.  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  Given that the patient has had frequent complaints of epigastric pain and discomfort, this is certainly a possible cause of the patient's ongoing pain and discomfort.     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  Friday May 09, 2023 we will plan on angiography.   2.  Back and bilateral lower extremity pain: This seems to be significantly improved.  I defer further workup to the primary service as this does not appear to be related to his vascular insufficiency.   3.  Cirrhosis and portal hypertension: Patient's recent TIPS procedure is patent appears to be working.  Although he did describe 1 bloody bowel movement he is certainly not presenting as a cirrhotic with hemorrhage from esophageal varices.  At this point I would defer further workup to the primary service in association with the GI service.   Levora Dredge, MD  05/07/2023 7:10 PM

## 2023-05-07 NOTE — Progress Notes (Addendum)
PROGRESS NOTE    Ethan Wells  ZOX:[id number] DOB: 11/27/1971 DOA: 05/05/2023 PCP: [name], NP    Brief Narrative:  52 y.o. male with medical history significant of alcohol abuse, alcoholic liver cirrhosis with portal gastropathy, s/p of TIPS, superior mesenteric artery stenosis, HTN, HLd, CAD, STEMI, dCHF, GERD, depression with anxiety, adrenal insufficiency (Addison's disease), migraine headache, cardiac arrest due to V-fib, recent admission due to GI bleeding, cocaine abuse, tobacco abuse, who presents with chest pain, abdominal pain, rectal bleeding.   Patient was recently hospitalized from 4/22 - 5/13 due to upper GI bleeding. Pt is s/p of EGD with banding on 4/23 and s/p of TIPS procedure on 4/27. Pt states that he continues to use cocaine and drinking alcohol.  He states that he has chest pain since yesterday, which is located in front and right side of chest, intermittent, come and goes, morbid, both sharp and dull pain, nonradiating.  Associated with dry cough and mild shortness breath.  He also reports abdominal pain, which is diffuse, constant, aching, moderate, nonradiating.    Seen in consultation by vascular surgery and gastroenterology.  Gastroenterology not planning any endoluminal evaluation.  Vascular surgery is recommending angiography with possible reintervention of SMA and celiac.  Nonemergent.  Tentatively planned for Friday 5/23  Patient is significantly lethargic but continues to request IV narcotics.  High suspicion for drug-seeking behavior.   Assessment & Plan:   Principal Problem:   GI bleeding Active Problems:   Anemia due to GI blood loss   Adrenal insufficiency (Addison's disease) (HCC)   Alcoholic cirrhosis (HCC)   Abdominal pain   Mesenteric artery stenosis (HCC)   Chest pain   CAD (coronary artery disease)   UTI (urinary tract infection)   HLD (hyperlipidemia)   Chronic diastolic CHF (congestive heart failure) (HCC)    Hypomagnesemia   Hypokalemia   Essential hypertension   Depression with anxiety   Tobacco abuse   Cocaine abuse (HCC)   Polysubstance abuse (HCC)   Alcohol abuse  GI bleeding and anemia due to GI blood loss Hemorrhoidal bleeding  Hgb 9.3 on 04/26/23 --. 10.0 --> 9.7 --> 9.2, slowly trending down.  Consulted GI GI evaluated patient.  No plans for endoscopic evaluation currently.Marland Kitchen admitted last month for upper gi bleed 2/2 variceal bleed treated with banding and tips.  Plan: Continue PPI for now Monitor hemoglobin Transfuse as needed less than 7 Anusol twice daily as needed  Adrenal insufficiency (Addison's disease) (HCC) -continue home Cortef 5 mg daily  Low back pain This is his primary compliant. Says it's chronic but worsening and now associated with gait dysfunction - pt consult - mri lumbar spine   Alcoholic cirrhosis (HCC):  Status post TIPS INR 1.5, ammonia 59, mental status poor but likely attributed to narcotics Plan: -Lactulose 20 g 3 times daily, increase to 30 tid given one bm last 24 -Rifaximin -Hold Lasix and spironolactone due to soft blood pressure, restart as appropriate   Abdominal pain and superior mesenteric artery stenosis  Etiology for his abdominal pain is not clear.  Potential differential diagnosis include superior mesenteric artery stenosis, SBP, UTI.  CT scan showed distended gallbladder, but US-RUQ has no evidence of cholecystitis.  Patient does not have fever or leukocytosis.  Does not seem to have acute cholecystitis.  Patient has history of superior mesenteric artery stenosis, CT scan also showed some abnormalities in the SMA.  Vascular consulted Plan: Tentative plan for angiography 5/24   Hx of CAD and chest  pain Possibly due to cocaine abuse.  Troponin level 18, 23, 17.   UDS is positive for cocaine.   Pyuria Asymptomatic, holding abx   HLD (hyperlipidemia) -Crestor   Chronic diastolic CHF (congestive heart failure) (HCC)  2D echo on  04/11/2023 showed EF of 55-60% with grade 2 diastolic dysfunction.  Patient mild plus leg edema, elevated BNP 426, but no oxygen desaturation.  Chest x-ray negative for pulmonary edema.  Does not seem to have acute CHF exacerbation, but patient had a high risk of developing CHF exacerbation.  Due to soft blood pressure, will not start IV diuretics. -Hold Lasix and spironolactone   Hypomagnesemia and Hypokalemia -Repleted magnesium and potassium   Essential hypertension Blood pressure soft 97/54 -Hold all blood pressure medications and diuretics due to soft blood pressure -Midodrine 10 mg 3 times daily, wean as tolerated   Depression with anxiety -Continue home medications   Polysubstance abuse Cocaine, alcohol, tobacco -Did counseling about importance of quitting substance use -Nicotine patch -CIWA protocol, no signs withdrawal   DVT prophylaxis: SCDs Code Status: Full Family Communication: None Disposition Plan: Status is: Inpatient Remains inpatient appropriate because: Multiple acute issues as above   Level of care: Progressive  Consultants:  GI Vascular  Procedures:  None  Antimicrobials: S/p Rocephin   Subjective: Seen and examined.  Lethargic.  Complaining of low back pain  Objective: Vitals:   05/07/23 0357 05/07/23 0602 05/07/23 0823 05/07/23 1210  BP: (!) 112/56  (!) 104/59 (!) 90/49  Pulse: 94  93 91  Resp: 18  18 16   Temp: 98.3 F (36.8 C)  98.5 F (36.9 C) 98.8 F (37.1 C)  TempSrc:   Oral Oral  SpO2: 100%  100% 98%  Weight:  82.6 kg    Height:        Intake/Output Summary (Last 24 hours) at 05/07/2023 1513 Last data filed at 05/07/2023 1042 Gross per 24 hour  Intake 1380 ml  Output 2750 ml  Net -1370 ml   Filed Weights   05/06/23 0300 05/06/23 0500 05/07/23 0602  Weight: 82.3 kg 82.3 kg 82.6 kg    Examination:  General exam: Lethargic. Respiratory system: Bibasilar crackles.  Normal work of breathing.  Room air Cardiovascular  system: S1-S2, RRR, no murmurs, no pedal edema Gastrointestinal system: Soft, mildly distended, normal bowel sounds Central nervous system: Alert but lethargic.  Umoving all 4 Back: no midline point tenderness Extremities: Unable to assess power.  Gait not assessed Skin: No rashes, lesions or ulcers Psychiatry: Unable to assess    Data Reviewed: I have personally reviewed following labs and imaging studies  CBC: Recent Labs  Lab 05/05/23 1313 05/05/23 2016 05/06/23 0152 05/06/23 0811 05/07/23 0519  WBC 5.6 5.5 4.9 4.7 5.1  NEUTROABS  --   --   --   --  2.8  HGB 9.2* 9.6* 9.3* 9.6* 9.1*  HCT 28.5* 30.5* 29.1* 30.6* 29.0*  MCV 88.2 90.0 89.3 90.0 90.6  PLT 181 203 170 166 142*   Basic Metabolic Panel: Recent Labs  Lab 05/04/23 1947 05/05/23 0131 05/05/23 2310 05/06/23 0558 05/07/23 0519  NA 134*  --   --  139 139  K 2.7*  --  4.4 3.9 3.3*  CL 99  --   --  113* 111  CO2 23  --   --  21* 21*  GLUCOSE 115*  --   --  137* 134*  BUN <5*  --   --  <5* <5*  CREATININE 0.80  --   --  0.78 0.70  CALCIUM 8.8*  --   --  8.3* 8.0*  MG  --  1.5*  --  2.1  --   PHOS  --  2.6  --   --   --    GFR: Estimated Creatinine Clearance: 109.2 mL/min (by C-G formula based on SCr of 0.7 mg/dL). Liver Function Tests: Recent Labs  Lab 05/04/23 1947 05/06/23 0558  AST 51* 53*  ALT 24 22  ALKPHOS 161* 143*  BILITOT 3.6* 2.5*  PROT 7.8 6.2*  ALBUMIN 2.9* 2.3*   Recent Labs  Lab 05/04/23 1947  LIPASE 31   Recent Labs  Lab 05/05/23 0132  AMMONIA 59*   Coagulation Profile: Recent Labs  Lab 05/05/23 0132  INR 1.5*   Cardiac Enzymes: No results for input(s): "CKTOTAL", "CKMB", "CKMBINDEX", "TROPONINI" in the last 168 hours. BNP (last 3 results) No results for input(s): "PROBNP" in the last 8760 hours. HbA1C: Recent Labs    05/04/23 1947  HGBA1C 5.2   CBG: Recent Labs  Lab 05/06/23 0815 05/06/23 1137 05/06/23 1522 05/07/23 0836  GLUCAP 105* 131* 163* 126*    Lipid Profile: Recent Labs    05/05/23 0131  CHOL 135  HDL 15*  LDLCALC 100*  TRIG 98  CHOLHDL 9.0   Thyroid Function Tests: No results for input(s): "TSH", "T4TOTAL", "FREET4", "T3FREE", "THYROIDAB" in the last 72 hours. Anemia Panel: No results for input(s): "VITAMINB12", "FOLATE", "FERRITIN", "TIBC", "IRON", "RETICCTPCT" in the last 72 hours. Sepsis Labs: Recent Labs  Lab 05/05/23 0956  LATICACIDVEN 1.2    Recent Results (from the past 240 hour(s))  Urine Culture     Status: Abnormal   Collection Time: 05/05/23  1:31 AM   Specimen: Urine, Clean Catch  Result Value Ref Range Status   Specimen Description   Final    URINE, CLEAN CATCH Performed at [facility], [address], [place], [place] [address]    Special Requests   Final    NONE Reflexed from [id number] Performed at [facility], [address] [place], [place] [address]    Culture >=100,000 COLONIES/mL HAFNIA SPECIES (A)  Final   Report Status 05/07/2023 FINAL  Final   Organism ID, Bacteria HAFNIA SPECIES (A)  Final      Susceptibility   Hafnia species - MIC*    AMPICILLIN >=32 RESISTANT Resistant     CIPROFLOXACIN <=0.25 SENSITIVE Sensitive     GENTAMICIN <=1 SENSITIVE Sensitive     IMIPENEM <=0.25 SENSITIVE Sensitive     NITROFURANTOIN <=16 SENSITIVE Sensitive     TRIMETH/SULFA <=20 SENSITIVE Sensitive     AMPICILLIN/SULBACTAM >=32 RESISTANT Resistant     PIP/TAZO >=128 RESISTANT Resistant     * >=100,000 COLONIES/mL HAFNIA SPECIES         Radiology Studies: DG Lumbar Spine 2-3 Views  Result Date: 05/06/2023 CLINICAL DATA:  Low back pain. EXAM: LUMBAR SPINE - 2-3 VIEW COMPARISON:  CT abdomen pelvis-05/05/2023 FINDINGS: There are 5 non rib-bearing lumbar type vertebral bodies. Normal alignment of the lumbar spine. No anterolisthesis or retrolisthesis. Lumbar vertebral body heights appear preserved. Mild to moderate multilevel lumbar spine DDD, worse at L3-L4 and L4-L5 with disc  space height loss, endplate irregularity and sclerosis. Limited visualization of the bilateral SI joints is normal. TIPS stent overlies the right upper abdominal quadrant. Vascular stent overlies the expected location of the SMA IMPRESSION: Mild-to-moderate multilevel lumbar spine DDD, worse at L3-L4 and L4-L5. Electronically Signed   By: Holland Commons.D.  On: 05/06/2023 10:04        Scheduled Meds:  folic acid  1 mg Oral Daily   gabapentin  300 mg Oral BID   hydrocortisone  10 mg Oral Daily   lactulose  20 g Oral TID   LORazepam  0-4 mg Intravenous Q12H   methocarbamol  500 mg Oral TID   midodrine  10 mg Oral TID WC   multivitamin with minerals  1 tablet Oral Daily   nicotine  21 mg Transdermal Daily   pantoprazole (PROTONIX) IV  40 mg Intravenous Q24H   QUEtiapine  50 mg Oral QHS   rifaximin  550 mg Oral BID   rosuvastatin  40 mg Oral QHS   thiamine  100 mg Oral Daily   Or   thiamine  100 mg Intravenous Daily   Continuous Infusions:  cefTRIAXone (ROCEPHIN)  IV 1 g (05/07/23 0825)     LOS: 2 days     Silvano Bilis, MD [facility]   If 7PM-7AM, please contact night-coverage  05/07/2023, 3:13 PM

## 2023-05-08 DIAGNOSIS — K922 Gastrointestinal hemorrhage, unspecified: Secondary | ICD-10-CM | POA: Diagnosis not present

## 2023-05-08 LAB — GLUCOSE, CAPILLARY
Glucose-Capillary: 134 mg/dL — ABNORMAL HIGH (ref 70–99)
Glucose-Capillary: 92 mg/dL (ref 70–99)
Glucose-Capillary: 123 mg/dL — ABNORMAL HIGH (ref 70–99)

## 2023-05-08 NOTE — Evaluation (Signed)
Physical Therapy Evaluation Patient Details Name: Ethan Wells MRN: [id number] DOB: February 12, 1971 Today's Date: 05/08/2023  History of Present Illness  Ethan Wells is a 52 y.o. male with medical history significant of alcohol abuse, alcoholic liver cirrhosis with portal gastropathy, s/p of TIPS, superior mesenteric artery stenosis, HTN, HLd, CAD, STEMI, dCHF, GERD, depression with anxiety, adrenal insufficiency (Addison's disease), migraine headache, cardiac arrest due to V-fib, recent admission due to GI bleeding, cocaine abuse, tobacco abuse, who presents with chest pain, abdominal pain, rectal bleeding. Multiple recent hospitalizations.   Clinical Impression  Patient presents ambulating in hallway with RW with NT supervision. He reports pain in back and legs. Appears lethargic. Slow ambulation pace. No lob. Patient is likely close to baseline level of function. Performed sit to supine with mod I. Patient will continue to benefit from acute skilled PT to improve strength and functional independence.            Recommendations for follow up therapy are one component of a multi-disciplinary discharge planning process, led by the attending physician.  Recommendations may be updated based on patient status, additional functional criteria and insurance authorization.  Follow Up Recommendations Can patient physically be transported by private vehicle: Yes     Assistance Recommended at Discharge Set up Supervision/Assistance  Patient can return home with the following  A little help with bathing/dressing/bathroom;Assist for transportation;Help with stairs or ramp for entrance;Assistance with cooking/housework;Direct supervision/assist for medications management    Equipment Recommendations None recommended by PT  Recommendations for Other Services       Functional Status Assessment Patient has not had a recent decline in their functional status     Precautions / Restrictions  Precautions Precautions: Fall Restrictions Weight Bearing Restrictions: No      Mobility  Bed Mobility Overal bed mobility: Modified Independent Bed Mobility: Sit to Supine       Sit to supine: Modified independent (Device/Increase time)   General bed mobility comments: patient received up walking with NT    Transfers                   General transfer comment: NT    Ambulation/Gait Ambulation/Gait assistance: Supervision Gait Distance (Feet): 150 Feet Assistive device: Rolling walker (2 wheels) Gait Pattern/deviations: Step-through pattern, Decreased step length - right, Decreased step length - left, Shuffle, Decreased stride length Gait velocity: decreased     General Gait Details: patient ambulating to Nurses station and back to room with supervision and RW  Stairs            Wheelchair Mobility    Modified Rankin (Stroke Patients Only)       Balance Overall balance assessment: Modified Independent Sitting-balance support: Feet supported Sitting balance-Leahy Scale: Good     Standing balance support: Bilateral upper extremity supported, During functional activity, Reliant on assistive device for balance Standing balance-Leahy Scale: Good Standing balance comment: reliant on RW                             Pertinent Vitals/Pain Pain Assessment Pain Assessment: Faces Faces Pain Scale: Hurts a little bit Breathing: normal Negative Vocalization: none Facial Expression: smiling or inexpressive Body Language: relaxed Consolability: no need to console PAINAD Score: 0 Pain Descriptors / Indicators: Sore Pain Intervention(s): Repositioned    Home Living Family/patient expects to be discharged to:: Group home Living Arrangements: Group Home (boarding house)   Type of Home: Group Home Home Access:  Stairs to enter Entrance Stairs-Rails: Lawyer of Steps: 5-6   Home Layout: Able to live on main level  with bedroom/bathroom Home Equipment: Cane - single point;Rolling Walker (2 wheels);Shower seat      Prior Function Prior Level of Function : Independent/Modified Independent             Mobility Comments: states he has a 4 wheeled walker       Hand Dominance   Dominant Hand: Right    Extremity/Trunk Assessment   Upper Extremity Assessment Upper Extremity Assessment: Generalized weakness    Lower Extremity Assessment Lower Extremity Assessment: Generalized weakness    Cervical / Trunk Assessment Cervical / Trunk Assessment: Normal  Communication      Cognition Arousal/Alertness: Lethargic Behavior During Therapy: Flat affect Overall Cognitive Status: No family/caregiver present to determine baseline cognitive functioning                                 General Comments: slow processing        General Comments      Exercises     Assessment/Plan    PT Assessment Patient needs continued PT services  PT Problem List Decreased activity tolerance;Decreased mobility;Decreased cognition;Decreased strength       PT Treatment Interventions DME instruction;Gait training;Stair training;Functional mobility training;Therapeutic activities;Patient/family education;Therapeutic exercise    PT Goals (Current goals can be found in the Care Plan section)  Acute Rehab PT Goals Patient Stated Goal: to go to a different group home PT Goal Formulation: With patient Time For Goal Achievement: 05/16/23 Potential to Achieve Goals: Fair    Frequency Min 2X/week     Co-evaluation               AM-PAC PT "6 Clicks" Mobility  Outcome Measure Help needed turning from your back to your side while in a flat bed without using bedrails?: None Help needed moving from lying on your back to sitting on the side of a flat bed without using bedrails?: None Help needed moving to and from a bed to a chair (including a wheelchair)?: A Little Help needed standing up  from a chair using your arms (e.g., wheelchair or bedside chair)?: A Little Help needed to walk in hospital room?: A Little Help needed climbing 3-5 steps with a railing? : A Little 6 Click Score: 20    End of Session   Activity Tolerance: Patient limited by fatigue Patient left: in bed;with call bell/phone within reach;with bed alarm set Nurse Communication: Mobility status PT Visit Diagnosis: Muscle weakness (generalized) (M62.81);Difficulty in walking, not elsewhere classified (R26.2)    Time: 0981-1914 PT Time Calculation (min) (ACUTE ONLY): 8 min   Charges:   PT Evaluation $PT Eval Low Complexity: 1 Low          Kwinton Maahs, PT, GCS 05/08/23,2:13 PM

## 2023-05-08 NOTE — Care Management Important Message (Signed)
Important Message  Patient Details  Name: Ethan Wells MRN: [id number] Date of Birth: 08-12-1971   Medicare Important Message Given:  Yes     [name] 05/08/2023, 1:29 PM

## 2023-05-08 NOTE — Progress Notes (Signed)
PT Cancellation Note  Patient Details Name: JAKEN NIEDRINGHAUS MRN: [id number] DOB: 1971/05/20   Cancelled Treatment:    Reason Eval/Treat Not Completed: Fatigue/lethargy limiting ability to participate Patient sleeping upon arrival, opens eyes briefly but does not respond to me at all. Will re-attempt later today.   Chasin Findling 05/08/2023, 9:17 AM

## 2023-05-08 NOTE — Progress Notes (Signed)
PROGRESS NOTE    Ethan Wells  FAO:[id number] DOB: Jan 24, 1971 DOA: 05/05/2023 PCP: [name], NP    Brief Narrative:  52 y.o. male with medical history significant of alcohol abuse, alcoholic liver cirrhosis with portal gastropathy, s/p of TIPS, superior mesenteric artery stenosis, HTN, HLd, CAD, STEMI, dCHF, GERD, depression with anxiety, adrenal insufficiency (Addison's disease), migraine headache, cardiac arrest due to V-fib, recent admission due to GI bleeding, cocaine abuse, tobacco abuse, who presents with chest pain, abdominal pain, rectal bleeding.   Patient was recently hospitalized from 4/22 - 5/13 due to upper GI bleeding. Pt is s/p of EGD with banding on 4/23 and s/p of TIPS procedure on 4/27. Pt states that he continues to use cocaine and drinking alcohol.  He states that he has chest pain since yesterday, which is located in front and right side of chest, intermittent, come and goes, morbid, both sharp and dull pain, nonradiating.  Associated with dry cough and mild shortness breath.  He also reports abdominal pain, which is diffuse, constant, aching, moderate, nonradiating.    Seen in consultation by vascular surgery and gastroenterology.  Gastroenterology not planning any endoluminal evaluation.  Vascular surgery is recommending angiography with possible reintervention of SMA and celiac.  Nonemergent.  Tentatively planned for Friday 5/23  Patient is significantly lethargic but continues to request IV narcotics.  High suspicion for drug-seeking behavior.   Assessment & Plan:   Principal Problem:   GI bleeding Active Problems:   Anemia due to GI blood loss   Adrenal insufficiency (Addison's disease) (HCC)   Alcoholic cirrhosis (HCC)   Abdominal pain   Mesenteric artery stenosis (HCC)   Chest pain   CAD (coronary artery disease)   UTI (urinary tract infection)   HLD (hyperlipidemia)   Chronic diastolic CHF (congestive heart failure) (HCC)    Hypomagnesemia   Hypokalemia   Essential hypertension   Depression with anxiety   Tobacco abuse   Cocaine abuse (HCC)   Polysubstance abuse (HCC)   Alcohol abuse  GI bleeding and anemia due to GI blood loss Hemorrhoidal bleeding  Hgb 9.3 on 04/26/23 --. 10.0 --> 9.7 --> 9.2, slowly trending down.  Consulted GI GI evaluated patient.  No plans for endoscopic evaluation currently.Marland Kitchen admitted last month for upper gi bleed 2/2 variceal bleed treated with banding and tips.  Plan: Continue PPI for now Monitor hemoglobin Transfuse as needed less than 7 Anusol twice daily as needed  Adrenal insufficiency (Addison's disease) (HCC) -continue home Cortef 5 mg daily  Low back pain This is his primary compliant. Says it's chronic but worsening and now associated with gait dysfunction. MRI of lumbar spine 5/22 not concerning - pt consulted   Alcoholic cirrhosis (HCC):  Status post TIPS INR 1.5, ammonia 59, mental status poor but likely attributed to narcotics Plan: -Lactulose 20 g 3 times daily, increased to 30 tid given one bm last 24 -Rifaximin -Hold Lasix and spironolactone due to soft blood pressure, restart as appropriate   Abdominal pain and superior mesenteric artery stenosis  Etiology for his abdominal pain is not clear.  Potential differential diagnosis include superior mesenteric artery stenosis, SBP, UTI.  CT scan showed distended gallbladder, but US-RUQ has no evidence of cholecystitis.  Patient does not have fever or leukocytosis.  Does not seem to have acute cholecystitis.  Patient has history of superior mesenteric artery stenosis, CT scan also showed some abnormalities in the SMA.  Vascular consulted Plan: plan for angiography 5/24   Hx of CAD  and chest pain Possibly due to cocaine abuse.  Troponin level 18, 23, 17.   UDS is positive for cocaine.   Pyuria Asymptomatic, holding abx   HLD (hyperlipidemia) -Crestor   Chronic diastolic CHF (congestive heart failure)  (HCC)  2D echo on 04/11/2023 showed EF of 55-60% with grade 2 diastolic dysfunction.  Patient mild plus leg edema, elevated BNP 426, but no oxygen desaturation.  Chest x-ray negative for pulmonary edema.  Does not seem to have acute CHF exacerbation, but patient had a high risk of developing CHF exacerbation.   -Hold Lasix and spironolactone   Hypomagnesemia and Hypokalemia -Repleted magnesium and potassium   Essential hypertension Blood pressure soft 97/54 -Hold all blood pressure medications and diuretics due to soft blood pressure -Midodrine 10 mg 3 times daily, wean as tolerated   Depression with anxiety -Continue home medications   Polysubstance abuse Cocaine, alcohol, tobacco -Did counseling about importance of quitting substance use -Nicotine patch -CIWA protocol, no signs withdrawal   DVT prophylaxis: SCDs Code Status: Full Family Communication: None Disposition Plan: Status is: Inpatient Remains inpatient appropriate because: plan for procedure tomorrow   Level of care: Progressive  Consultants:  GI Vascular  Procedures:  None  Antimicrobials: S/p Rocephin   Subjective: Seen and examined.  Sitting edge of bed. Ambulated with walker earlier today.   Objective: Vitals:   05/08/23 0426 05/08/23 0500 05/08/23 0757 05/08/23 1125  BP: 119/74  (!) 93/53 (!) 93/48  Pulse: 100  96 91  Resp: 18  (!) 2 (!) 5  Temp: 98.2 F (36.8 C)  98.5 F (36.9 C) (!) 97.5 F (36.4 C)  TempSrc:   Oral Oral  SpO2: 99%  97%   Weight:  85.4 kg    Height:        Intake/Output Summary (Last 24 hours) at 05/08/2023 1355 Last data filed at 05/08/2023 1300 Gross per 24 hour  Intake 474 ml  Output 2825 ml  Net -2351 ml   Filed Weights   05/06/23 0500 05/07/23 0602 05/08/23 0500  Weight: 82.3 kg 82.6 kg 85.4 kg    Examination:  General exam: Lethargic. Respiratory system: Bibasilar crackles.  Normal work of breathing.  Room air Cardiovascular system: S1-S2, RRR, no  murmurs, no pedal edema Gastrointestinal system: Soft, mildly distended, normal bowel sounds Central nervous system: Alert but lethargic.  Umoving all 4 Back: no midline point tenderness Extremities: Unable to assess power.  Gait not assessed Skin: No rashes, lesions or ulcers Psychiatry: flat affect    Data Reviewed: I have personally reviewed following labs and imaging studies  CBC: Recent Labs  Lab 05/05/23 1313 05/05/23 2016 05/06/23 0152 05/06/23 0811 05/07/23 0519  WBC 5.6 5.5 4.9 4.7 5.1  NEUTROABS  --   --   --   --  2.8  HGB 9.2* 9.6* 9.3* 9.6* 9.1*  HCT 28.5* 30.5* 29.1* 30.6* 29.0*  MCV 88.2 90.0 89.3 90.0 90.6  PLT 181 203 170 166 142*   Basic Metabolic Panel: Recent Labs  Lab 05/04/23 1947 05/05/23 0131 05/05/23 2310 05/06/23 0558 05/07/23 0519  NA 134*  --   --  139 139  K 2.7*  --  4.4 3.9 3.3*  CL 99  --   --  113* 111  CO2 23  --   --  21* 21*  GLUCOSE 115*  --   --  137* 134*  BUN <5*  --   --  <5* <5*  CREATININE 0.80  --   --  0.78 0.70  CALCIUM 8.8*  --   --  8.3* 8.0*  MG  --  1.5*  --  2.1  --   PHOS  --  2.6  --   --   --    GFR: Estimated Creatinine Clearance: 111.1 mL/min (by C-G formula based on SCr of 0.7 mg/dL). Liver Function Tests: Recent Labs  Lab 05/04/23 1947 05/06/23 0558  AST 51* 53*  ALT 24 22  ALKPHOS 161* 143*  BILITOT 3.6* 2.5*  PROT 7.8 6.2*  ALBUMIN 2.9* 2.3*   Recent Labs  Lab 05/04/23 1947  LIPASE 31   Recent Labs  Lab 05/05/23 0132  AMMONIA 59*   Coagulation Profile: Recent Labs  Lab 05/05/23 0132  INR 1.5*   Cardiac Enzymes: No results for input(s): "CKTOTAL", "CKMB", "CKMBINDEX", "TROPONINI" in the last 168 hours. BNP (last 3 results) No results for input(s): "PROBNP" in the last 8760 hours. HbA1C: No results for input(s): "HGBA1C" in the last 72 hours.  CBG: Recent Labs  Lab 05/06/23 1137 05/06/23 1522 05/07/23 0836 05/08/23 0759 05/08/23 1122  GLUCAP 131* 163* 126* 134* 92    Lipid Profile: No results for input(s): "CHOL", "HDL", "LDLCALC", "TRIG", "CHOLHDL", "LDLDIRECT" in the last 72 hours.  Thyroid Function Tests: No results for input(s): "TSH", "T4TOTAL", "FREET4", "T3FREE", "THYROIDAB" in the last 72 hours. Anemia Panel: No results for input(s): "VITAMINB12", "FOLATE", "FERRITIN", "TIBC", "IRON", "RETICCTPCT" in the last 72 hours. Sepsis Labs: Recent Labs  Lab 05/05/23 0956  LATICACIDVEN 1.2    Recent Results (from the past 240 hour(s))  Urine Culture     Status: Abnormal   Collection Time: 05/05/23  1:31 AM   Specimen: Urine, Clean Catch  Result Value Ref Range Status   Specimen Description   Final    URINE, CLEAN CATCH Performed at [facility], [address] [place], [place] [address]    Special Requests   Final    NONE Reflexed from [id number] Performed at [facility], [address] [place], [place] [address]    Culture >=100,000 COLONIES/mL HAFNIA SPECIES (A)  Final   Report Status 05/07/2023 FINAL  Final   Organism ID, Bacteria HAFNIA SPECIES (A)  Final      Susceptibility   Hafnia species - MIC*    AMPICILLIN >=32 RESISTANT Resistant     CIPROFLOXACIN <=0.25 SENSITIVE Sensitive     GENTAMICIN <=1 SENSITIVE Sensitive     IMIPENEM <=0.25 SENSITIVE Sensitive     NITROFURANTOIN <=16 SENSITIVE Sensitive     TRIMETH/SULFA <=20 SENSITIVE Sensitive     AMPICILLIN/SULBACTAM >=32 RESISTANT Resistant     PIP/TAZO >=128 RESISTANT Resistant     * >=100,000 COLONIES/mL HAFNIA SPECIES         Radiology Studies: MR LUMBAR SPINE WO CONTRAST  Result Date: 05/07/2023 CLINICAL DATA:  Worsening low back pain with difficulty walking EXAM: MRI LUMBAR SPINE WITHOUT CONTRAST TECHNIQUE: Multiplanar, multisequence MR imaging of the lumbar spine was performed. No intravenous contrast was administered. COMPARISON:  12/08/2020 FINDINGS: Segmentation:  Standard. Alignment:  Physiologic. Vertebrae:  No fracture, evidence of discitis, or  bone lesion. Conus medullaris and cauda equina: Conus extends to the L1 level. Conus and cauda equina appear normal. Paraspinal and other soft tissues: Negative. Disc levels: L1-L2: Normal disc space and facet joints. No spinal canal stenosis. No neural foraminal stenosis. L2-L3: Normal disc space and facet joints. No spinal canal stenosis. No neural foraminal stenosis. L3-L4: Small disc bulge. No spinal canal stenosis. No  neural foraminal stenosis. L4-L5: Unchanged small central disc protrusion. No spinal canal stenosis. No neural foraminal stenosis. L5-S1: Small central disc protrusion. No spinal canal stenosis. Mild left neural foraminal stenosis. Visualized sacrum: Normal. IMPRESSION: 1. Unchanged small central disc protrusions at L4-L5 and L5-S1 without spinal canal stenosis. 2. Mild left L5-S1 neural foraminal stenosis. Electronically Signed   By: Deatra Robinson M.D.   On: 05/07/2023 23:19        Scheduled Meds:  folic acid  1 mg Oral Daily   gabapentin  100 mg Oral BID   hydrocortisone  10 mg Oral Daily   lactulose  30 g Oral TID   lidocaine  1 patch Transdermal Q24H   LORazepam  0-4 mg Intravenous Q12H   midodrine  10 mg Oral TID WC   multivitamin with minerals  1 tablet Oral Daily   nicotine  21 mg Transdermal Daily   pantoprazole  40 mg Oral Daily   QUEtiapine  50 mg Oral QHS   rifaximin  550 mg Oral BID   rosuvastatin  40 mg Oral QHS   thiamine  100 mg Oral Daily   Or   thiamine  100 mg Intravenous Daily   Continuous Infusions:     LOS: 3 days     Silvano Bilis, MD [facility]   If 7PM-7AM, please contact night-coverage  05/08/2023, 1:55 PM

## 2023-05-09 ENCOUNTER — Inpatient Hospital Stay: Payer: Medicare HMO

## 2023-05-09 ENCOUNTER — Other Ambulatory Visit: Payer: Self-pay

## 2023-05-09 ENCOUNTER — Encounter: Payer: Self-pay | Admitting: Internal Medicine

## 2023-05-09 ENCOUNTER — Encounter
Admission: EM | Disposition: A | Discharge status: Home / Self Care | Payer: Self-pay | Source: Home / Self Care | Attending: Obstetrics and Gynecology

## 2023-05-09 DIAGNOSIS — T82858A Stenosis of vascular prosthetic devices, implants and grafts, initial encounter: Secondary | ICD-10-CM | POA: Diagnosis not present

## 2023-05-09 DIAGNOSIS — K55059 Acute (reversible) ischemia of intestine, part and extent unspecified: Secondary | ICD-10-CM | POA: Diagnosis not present

## 2023-05-09 DIAGNOSIS — K551 Chronic vascular disorders of intestine: Secondary | ICD-10-CM | POA: Diagnosis not present

## 2023-05-09 DIAGNOSIS — K922 Gastrointestinal hemorrhage, unspecified: Secondary | ICD-10-CM | POA: Diagnosis not present

## 2023-05-09 DIAGNOSIS — I708 Atherosclerosis of other arteries: Secondary | ICD-10-CM | POA: Diagnosis not present

## 2023-05-09 HISTORY — PX: VISCERAL ARTERY INTERVENTION: CATH118277

## 2023-05-09 LAB — CBC WITH DIFFERENTIAL/PLATELET
Abs Immature Granulocytes: 0.05 10*3/uL (ref 0.00–0.07)
Basophils Absolute: 0 10*3/uL (ref 0.0–0.1)
Basophils Relative: 0 %
Eosinophils Absolute: 0 10*3/uL (ref 0.0–0.5)
Eosinophils Relative: 0 %
HCT: 32.5 % — ABNORMAL LOW (ref 39.0–52.0)
Hemoglobin: 10.7 g/dL — ABNORMAL LOW (ref 13.0–17.0)
Immature Granulocytes: 1 %
Lymphocytes Relative: 9 %
Lymphs Abs: 0.9 10*3/uL (ref 0.7–4.0)
MCH: 28.5 pg (ref 26.0–34.0)
MCHC: 32.9 g/dL (ref 30.0–36.0)
MCV: 86.7 fL (ref 80.0–100.0)
Monocytes Absolute: 0.7 10*3/uL (ref 0.1–1.0)
Monocytes Relative: 7 %
Neutro Abs: 8.3 10*3/uL — ABNORMAL HIGH (ref 1.7–7.7)
Neutrophils Relative %: 83 %
Platelets: 110 10*3/uL — ABNORMAL LOW (ref 150–400)
RBC: 3.75 MIL/uL — ABNORMAL LOW (ref 4.22–5.81)
RDW: 18.4 % — ABNORMAL HIGH (ref 11.5–15.5)
WBC: 10 10*3/uL (ref 4.0–10.5)
nRBC: 0 % (ref 0.0–0.2)

## 2023-05-09 LAB — COMPREHENSIVE METABOLIC PANEL
ALT: 31 U/L (ref 0–44)
ALT: 37 U/L (ref 0–44)
AST: 79 U/L — ABNORMAL HIGH (ref 15–41)
AST: 96 U/L — ABNORMAL HIGH (ref 15–41)
Albumin: 2.5 g/dL — ABNORMAL LOW (ref 3.5–5.0)
Albumin: 2.6 g/dL — ABNORMAL LOW (ref 3.5–5.0)
Alkaline Phosphatase: 197 U/L — ABNORMAL HIGH (ref 38–126)
Alkaline Phosphatase: 229 U/L — ABNORMAL HIGH (ref 38–126)
Anion gap: 6 (ref 5–15)
Anion gap: 10 (ref 5–15)
BUN: 7 mg/dL (ref 6–20)
BUN: 9 mg/dL (ref 6–20)
CO2: 19 mmol/L — ABNORMAL LOW (ref 22–32)
CO2: 17 mmol/L — ABNORMAL LOW (ref 22–32)
Calcium: 8.3 mg/dL — ABNORMAL LOW (ref 8.9–10.3)
Calcium: 8.5 mg/dL — ABNORMAL LOW (ref 8.9–10.3)
Chloride: 110 mmol/L (ref 98–111)
Chloride: 105 mmol/L (ref 98–111)
Creatinine, Ser: 0.57 mg/dL — ABNORMAL LOW (ref 0.61–1.24)
Creatinine, Ser: 0.57 mg/dL — ABNORMAL LOW (ref 0.61–1.24)
GFR, Estimated: >60 mL/min (ref >60)
GFR, Estimated: >60 mL/min (ref >60)
Glucose, Bld: 129 mg/dL — ABNORMAL HIGH (ref 70–99)
Glucose, Bld: 108 mg/dL — ABNORMAL HIGH (ref 70–99)
Potassium: 3.5 mmol/L (ref 3.5–5.1)
Potassium: 3.5 mmol/L (ref 3.5–5.1)
Sodium: 135 mmol/L (ref 135–145)
Sodium: 132 mmol/L — ABNORMAL LOW (ref 135–145)
Total Bilirubin: 2.4 mg/dL — ABNORMAL HIGH (ref 0.3–1.2)
Total Bilirubin: 4.5 mg/dL — ABNORMAL HIGH (ref 0.3–1.2)
Total Protein: 6.5 g/dL (ref 6.5–8.1)
Total Protein: 6.9 g/dL (ref 6.5–8.1)

## 2023-05-09 LAB — PROTIME-INR
INR: 1.5 — ABNORMAL HIGH (ref 0.8–1.2)
Prothrombin Time: 18.2 seconds — ABNORMAL HIGH (ref 11.4–15.2)

## 2023-05-09 LAB — BLOOD GAS, VENOUS
Acid-base deficit: 4.3 mmol/L — ABNORMAL HIGH (ref 0.0–2.0)
Bicarbonate: 17.8 mmol/L — ABNORMAL LOW (ref 20.0–28.0)
O2 Saturation: 99.5 %
Patient temperature: 37
pCO2, Ven: 25 mmHg — ABNORMAL LOW (ref 44–60)
pH, Ven: 7.46 — ABNORMAL HIGH (ref 7.25–7.43)
pO2, Ven: 151 mmHg — ABNORMAL HIGH (ref 32–45)

## 2023-05-09 LAB — GLUCOSE, CAPILLARY
Glucose-Capillary: 136 mg/dL — ABNORMAL HIGH (ref 70–99)
Glucose-Capillary: 103 mg/dL — ABNORMAL HIGH (ref 70–99)
Glucose-Capillary: 90 mg/dL (ref 70–99)

## 2023-05-09 LAB — CBC
HCT: 30.5 % — ABNORMAL LOW (ref 39.0–52.0)
Hemoglobin: 9.8 g/dL — ABNORMAL LOW (ref 13.0–17.0)
MCH: 28.2 pg (ref 26.0–34.0)
MCHC: 32.1 g/dL (ref 30.0–36.0)
MCV: 87.9 fL (ref 80.0–100.0)
Platelets: 106 10*3/uL — ABNORMAL LOW (ref 150–400)
RBC: 3.47 MIL/uL — ABNORMAL LOW (ref 4.22–5.81)
RDW: 18 % — ABNORMAL HIGH (ref 11.5–15.5)
WBC: 7.3 10*3/uL (ref 4.0–10.5)
nRBC: 0 % (ref 0.0–0.2)

## 2023-05-09 LAB — TROPONIN I (HIGH SENSITIVITY): Troponin I (High Sensitivity): 11 ng/L (ref <18)

## 2023-05-09 LAB — AMMONIA: Ammonia: 58 umol/L — ABNORMAL HIGH (ref 9–35)

## 2023-05-09 LAB — LIPASE, BLOOD: Lipase: 37 U/L (ref 11–51)

## 2023-05-09 LAB — LACTIC ACID, PLASMA
Lactic Acid, Venous: 1.6 mmol/L (ref 0.5–1.9)
Lactic Acid, Venous: 1.9 mmol/L (ref 0.5–1.9)

## 2023-05-09 SURGERY — VISCERAL ARTERY INTERVENTION
Anesthesia: Moderate Sedation

## 2023-05-09 MED ORDER — HEPARIN SODIUM (PORCINE) 1000 UNIT/ML IJ SOLN
INTRAMUSCULAR | Status: AC
  Filled 2023-05-09: qty 10

## 2023-05-09 MED ORDER — DIPHENHYDRAMINE HCL 50 MG/ML IJ SOLN: 50.0000 mg | Freq: Once | INTRAMUSCULAR | Status: DC | PRN

## 2023-05-09 MED ORDER — SODIUM CHLORIDE 0.9% FLUSH: 3.0000 mL | INTRAVENOUS | Status: DC | PRN

## 2023-05-09 MED ORDER — FENTANYL CITRATE (PF) 100 MCG/2ML IJ SOLN
INTRAMUSCULAR | Status: DC | PRN
  Administered 2023-05-09 (×2): 12.5 ug via INTRAVENOUS
  Administered 2023-05-09: 50 ug via INTRAVENOUS

## 2023-05-09 MED ORDER — HEPARIN SODIUM (PORCINE) 1000 UNIT/ML IJ SOLN
INTRAMUSCULAR | Status: DC | PRN
  Administered 2023-05-09: 6000 [IU] via INTRAVENOUS

## 2023-05-09 MED ORDER — FENTANYL CITRATE (PF) 100 MCG/2ML IJ SOLN
INTRAMUSCULAR | Status: AC
  Filled 2023-05-09: qty 2

## 2023-05-09 MED ORDER — VANCOMYCIN HCL IN DEXTROSE 1-5 GM/200ML-% IV SOLN
1000.0000 mg | INTRAVENOUS | Status: AC
  Administered 2023-05-09: 1000 mg via INTRAVENOUS

## 2023-05-09 MED ORDER — SODIUM CHLORIDE 0.9 % IV SOLN: INTRAVENOUS | Status: DC

## 2023-05-09 MED ORDER — MORPHINE SULFATE (PF) 4 MG/ML IV SOLN
2.0000 mg | INTRAVENOUS | Status: DC | PRN
  Administered 2023-05-09 – 2023-05-11 (×9): 2 mg via INTRAVENOUS
  Filled 2023-05-09 (×8): qty 1

## 2023-05-09 MED ORDER — METRONIDAZOLE 500 MG/100ML IV SOLN
500.0000 mg | Freq: Two times a day (BID) | INTRAVENOUS | Status: DC
  Administered 2023-05-09 – 2023-05-12 (×6): 500 mg via INTRAVENOUS
  Filled 2023-05-09 (×8): qty 100

## 2023-05-09 MED ORDER — ONDANSETRON HCL 4 MG/2ML IJ SOLN: 4.0000 mg | Freq: Four times a day (QID) | INTRAMUSCULAR | Status: DC | PRN

## 2023-05-09 MED ORDER — IODIXANOL 320 MG/ML IV SOLN
INTRAVENOUS | Status: DC | PRN
  Administered 2023-05-09: 105 mL

## 2023-05-09 MED ORDER — MIDAZOLAM HCL 5 MG/5ML IJ SOLN
INTRAMUSCULAR | Status: AC
  Filled 2023-05-09: qty 5

## 2023-05-09 MED ORDER — CLOPIDOGREL BISULFATE 75 MG PO TABS: 75.0000 mg | ORAL_TABLET | Freq: Every day | ORAL | Status: DC

## 2023-05-09 MED ORDER — VANCOMYCIN HCL 1750 MG/350ML IV SOLN
1750.0000 mg | Freq: Once | INTRAVENOUS | Status: AC
  Administered 2023-05-09: 1750 mg via INTRAVENOUS
  Filled 2023-05-09: qty 350

## 2023-05-09 MED ORDER — MIDAZOLAM HCL 2 MG/ML PO SYRP: 8.0000 mg | ORAL_SOLUTION | Freq: Once | ORAL | Status: DC | PRN

## 2023-05-09 MED ORDER — IOHEXOL 300 MG/ML  SOLN
100.0000 mL | Freq: Once | INTRAMUSCULAR | Status: AC | PRN
  Administered 2023-05-09: 100 mL via INTRAVENOUS

## 2023-05-09 MED ORDER — IOHEXOL 9 MG/ML PO SOLN: 500.0000 mL | ORAL | Status: AC

## 2023-05-09 MED ORDER — SODIUM CHLORIDE 0.9 % IV SOLN
1.0000 g | Freq: Three times a day (TID) | INTRAVENOUS | Status: DC
  Administered 2023-05-09 – 2023-05-10 (×2): 1 g via INTRAVENOUS
  Filled 2023-05-09 (×2): qty 1
  Filled 2023-05-09: qty 5

## 2023-05-09 MED ORDER — HYDROMORPHONE HCL 1 MG/ML IJ SOLN
1.0000 mg | Freq: Once | INTRAMUSCULAR | Status: AC | PRN
  Administered 2023-05-09: 1 mg via INTRAVENOUS
  Filled 2023-05-09: qty 1

## 2023-05-09 MED ORDER — VANCOMYCIN HCL 750 MG/150ML IV SOLN
750.0000 mg | Freq: Two times a day (BID) | INTRAVENOUS | Status: DC
  Filled 2023-05-09: qty 150

## 2023-05-09 MED ORDER — VANCOMYCIN HCL IN DEXTROSE 1-5 GM/200ML-% IV SOLN
INTRAVENOUS | Status: AC
  Filled 2023-05-09: qty 200

## 2023-05-09 MED ORDER — SODIUM CHLORIDE 0.9% FLUSH
3.0000 mL | Freq: Two times a day (BID) | INTRAVENOUS | Status: DC
  Administered 2023-05-09 – 2023-05-11 (×6): 3 mL via INTRAVENOUS

## 2023-05-09 MED ORDER — MIDAZOLAM HCL 2 MG/2ML IJ SOLN
INTRAMUSCULAR | Status: DC | PRN
  Administered 2023-05-09: .5 mg via INTRAVENOUS
  Administered 2023-05-09: 2 mg via INTRAVENOUS
  Administered 2023-05-09: .5 mg via INTRAVENOUS

## 2023-05-09 MED ORDER — MORPHINE SULFATE (PF) 2 MG/ML IV SOLN
INTRAVENOUS | Status: AC
  Filled 2023-05-09: qty 1

## 2023-05-09 MED ORDER — OXYCODONE HCL 5 MG PO TABS
5.0000 mg | ORAL_TABLET | ORAL | Status: DC | PRN
  Administered 2023-05-10 – 2023-05-13 (×14): 10 mg via ORAL
  Filled 2023-05-09 (×15): qty 2

## 2023-05-09 MED ORDER — SODIUM CHLORIDE 0.9 % IV SOLN
250.0000 mL | INTRAVENOUS | Status: DC | PRN
  Administered 2023-05-10 – 2023-05-12 (×3): 250 mL via INTRAVENOUS

## 2023-05-09 MED ORDER — METHYLPREDNISOLONE SODIUM SUCC 125 MG IJ SOLR: 125.0000 mg | Freq: Once | INTRAMUSCULAR | Status: DC | PRN

## 2023-05-09 MED ORDER — FAMOTIDINE 20 MG PO TABS: 40.0000 mg | ORAL_TABLET | Freq: Once | ORAL | Status: DC | PRN

## 2023-05-09 SURGICAL SUPPLY — 23 items
BALLN LUTONIX AV 8X60X75 (BALLOONS) ×1 IMPLANT
BALLN LUTONIX DCB 7X40X130 (BALLOONS) ×1 IMPLANT
BALLOON LUTONIX AV 8X60X75 (BALLOONS) IMPLANT
BALLOON LUTONIX DCB 7X40X130 (BALLOONS) IMPLANT
CATH ANGIO 5F PIGTAIL 65CM (CATHETERS) IMPLANT
CATH BEACON 5 .035 65 KMP TIP (CATHETERS) IMPLANT
CATH VS15FR (CATHETERS) IMPLANT
COVER PROBE ULTRASOUND 5X96 (MISCELLANEOUS) IMPLANT
DEVICE STARCLOSE SE CLOSURE (Vascular Products) IMPLANT
GLIDEWIRE STIFF .35X180X3 HYDR (WIRE) IMPLANT
KIT ENCORE 26 ADVANTAGE (KITS) IMPLANT
NDL ENTRY 21GA 7CM ECHOTIP (NEEDLE) IMPLANT
NEEDLE ENTRY 21GA 7CM ECHOTIP (NEEDLE) ×1 IMPLANT
PACK ANGIOGRAPHY (CUSTOM PROCEDURE TRAY) IMPLANT
SET INTRO CAPELLA COAXIAL (SET/KITS/TRAYS/PACK) IMPLANT
SHEATH ANL2 6FRX45 HC (SHEATH) IMPLANT
SHEATH BRITE TIP 5FRX11 (SHEATH) IMPLANT
STENT LIFESTREAM 7X16X80 (Permanent Stent) IMPLANT
STENT LIFESTREAM 7X26X80 (Permanent Stent) IMPLANT
SYR MEDRAD MARK 7 150ML (SYRINGE) IMPLANT
TUBING CONTRAST HIGH PRESS 72 (TUBING) IMPLANT
WIRE GUIDERIGHT .035X150 (WIRE) IMPLANT
WIRE SUPRACORE 300CM (WIRE) IMPLANT

## 2023-05-09 NOTE — Progress Notes (Signed)
PROGRESS NOTE    Ethan Wells  UJW:[id number] DOB: [birth year] DOA: 05/05/2023 PCP: [name], NP    Brief Narrative:  52 y.o. male with medical history significant of alcohol abuse, alcoholic liver cirrhosis with portal gastropathy, s/p of TIPS, superior mesenteric artery stenosis, HTN, HLd, CAD, STEMI, dCHF, GERD, depression with anxiety, adrenal insufficiency (Addison's disease), migraine headache, cardiac arrest due to V-fib, recent admission due to GI bleeding, cocaine abuse, tobacco abuse, who presents with chest pain, abdominal pain, rectal bleeding.   Patient was recently hospitalized from 4/22 - 5/13 due to upper GI bleeding. Pt is s/p of EGD with banding on 4/23 and s/p of TIPS procedure on 4/27. Pt states that he continues to use cocaine and drinking alcohol.  He states that he has chest pain since yesterday, which is located in front and right side of chest, intermittent, come and goes, morbid, both sharp and dull pain, nonradiating.  Associated with dry cough and mild shortness breath.  He also reports abdominal pain, which is diffuse, constant, aching, moderate, nonradiating.    Seen in consultation by vascular surgery and gastroenterology.  Gastroenterology not planning any endoluminal evaluation.  Vascular surgery is recommending angiography with possible reintervention of SMA and celiac.  Nonemergent.  Tentatively planned for Friday 5/23  Patient is significantly lethargic but continues to request IV narcotics.  High suspicion for drug-seeking behavior.   Assessment & Plan:   Principal Problem:   GI bleeding Active Problems:   Anemia due to GI blood loss   Adrenal insufficiency (Addison's disease) (HCC)   Alcoholic cirrhosis (HCC)   Abdominal pain   Mesenteric artery stenosis (HCC)   Chest pain   CAD (coronary artery disease)   UTI (urinary tract infection)   HLD (hyperlipidemia)   Chronic diastolic CHF (congestive heart failure) (HCC)    Hypomagnesemia   Hypokalemia   Essential hypertension   Depression with anxiety   Tobacco abuse   Cocaine abuse (HCC)   Polysubstance abuse (HCC)   Alcohol abuse  Chest pain Abdominal pain After vascular procedure complaining of chest pain, worsening abdominal pain. Also experienced vomiting. EKG nothing acute. Normal tropnin. No hypoxia or respiratory distress. CXR obtained and pending - ct c/a/p ordered to further evaluat  GI bleeding and anemia due to GI blood loss Hemorrhoidal bleeding Hgb stable in the 9s.   GI evaluated patient, thinks likely hemorrhoidal.  No plans for endoscopic evaluation currently.Marland Kitchen admitted last month for upper gi bleed 2/2 variceal bleed treated with banding and tips.  Plan: Continue PPI for now Monitor hemoglobin Transfuse as needed less than 7 Anusol twice daily as needed  Adrenal insufficiency (Addison's disease) (HCC) -continue home Cortef 5 mg daily  Low back pain This is his primary compliant. Says it's chronic but worsening and now associated with gait dysfunction. MRI of lumbar spine 5/22 shows small disc protrusions, no stenosis, mild foraminal stenosis. - pt advising home health   Alcoholic cirrhosis (HCC):  Status post TIPS INR 1.5, ammonia 59, mental status poor but likely attributed to narcotics Plan: -Lactulose 20 g 3 times daily, increased to 30 tid   -Rifaximin -Hold Lasix and spironolactone due to soft blood pressure, restart as appropriate   Abdominal pain and superior mesenteric artery stenosis  Etiology for his abdominal pain is not clear.  Potential differential diagnosis include superior mesenteric artery stenosis, SBP, UTI.  CT scan showed distended gallbladder, but US-RUQ has no evidence of cholecystitis.  Patient does not have fever or leukocytosis.  Does not seem to have acute cholecystitis.  Patient has history of superior mesenteric artery stenosis, CT scan also showed some abnormalities in the SMA.  Vascular performed  percutaneous procedure today with several stents to the SMA. - CT c/a/p as above - cont asa/statin   Pyuria Asymptomatic, holding abx   HLD (hyperlipidemia) -Crestor   Chronic diastolic CHF (congestive heart failure) (HCC)  2D echo on 04/11/2023 showed EF of 55-60% with grade 2 diastolic dysfunction.  Patient mild plus leg edema, elevated BNP 426, but no oxygen desaturation.  Chest x-ray negative for pulmonary edema.  Does not seem to have acute CHF exacerbation, but patient had a high risk of developing CHF exacerbation.   -Hold Lasix and spironolactone as above   Hypomagnesemia and Hypokalemia -Repleted magnesium and potassium   Essential hypertension Blood pressure soft with maps in upper 60s -Hold all blood pressure medications and diuretics due to soft blood pressure -Midodrine 10 mg 3 times daily, wean as tolerated   Depression with anxiety -Continue home medications   Polysubstance abuse Cocaine, alcohol, tobacco -Did counseling about importance of quitting substance use -Nicotine patch -CIWA protocol, no signs withdrawal   DVT prophylaxis: SCDs Code Status: Full Family Communication: None Disposition Plan: Status is: Inpatient Remains inpatient appropriate because: ongoing w/u of abdominal pain   Level of care: Progressive  Consultants:  GI Vascular  Procedures:  None  Antimicrobials: S/p Rocephin   Subjective: Seen and examined.  C/o abd pain and chest pain  Objective: Vitals:   05/09/23 1032 05/09/23 1047 05/09/23 1214 05/09/23 1216  BP: 117/62 136/84 (!) 95/54 (!) 114/59  Pulse: 100 98 94 92  Resp: 17 19 18    Temp:   (!) 97.5 F (36.4 C)   TempSrc:   Oral   SpO2: 100% 100% 100% 100%  Weight:      Height:        Intake/Output Summary (Last 24 hours) at 05/09/2023 1444 Last data filed at 05/09/2023 1357 Gross per 24 hour  Intake 240 ml  Output 2300 ml  Net -2060 ml   Filed Weights   05/07/23 0602 05/08/23 0500 05/09/23 0445  Weight:  82.6 kg 85.4 kg 83 kg    Examination:  General exam: Lethargic. Respiratory system: Bibasilar crackles.  Normal work of breathing.  Room air Cardiovascular system: S1-S2, RRR, no murmurs, no pedal edema Gastrointestinal system: Soft, mildly distended, normal bowel sounds, diffuse mild generalized tenderness Central nervous system: Alert but lethargic.  Umoving all 4 Back: no midline point tenderness Extremities: Unable to assess power.  Gait not assessed Skin: No rashes, lesions or ulcers Psychiatry: flat affect    Data Reviewed: I have personally reviewed following labs and imaging studies  CBC: Recent Labs  Lab 05/05/23 2016 05/06/23 0152 05/06/23 0811 05/07/23 0519 05/09/23 0505  WBC 5.5 4.9 4.7 5.1 7.3  NEUTROABS  --   --   --  2.8  --   HGB 9.6* 9.3* 9.6* 9.1* 9.8*  HCT 30.5* 29.1* 30.6* 29.0* 30.5*  MCV 90.0 89.3 90.0 90.6 87.9  PLT 203 170 166 142* 106*   Basic Metabolic Panel: Recent Labs  Lab 05/04/23 1947 05/05/23 0131 05/05/23 2310 05/06/23 0558 05/07/23 0519 05/09/23 0505  NA 134*  --   --  139 139 135  K 2.7*  --  4.4 3.9 3.3* 3.5  CL 99  --   --  113* 111 110  CO2 23  --   --  21* 21* 19*  GLUCOSE  115*  --   --  137* 134* 129*  BUN <5*  --   --  <5* <5* 7  CREATININE 0.80  --   --  0.78 0.70 0.57*  CALCIUM 8.8*  --   --  8.3* 8.0* 8.3*  MG  --  1.5*  --  2.1  --   --   PHOS  --  2.6  --   --   --   --    GFR: Estimated Creatinine Clearance: 109.5 mL/min (A) (by C-G formula based on SCr of 0.57 mg/dL (L)). Liver Function Tests: Recent Labs  Lab 05/04/23 1947 05/06/23 0558 05/09/23 0505  AST 51* 53* 79*  ALT 24 22 31   ALKPHOS 161* 143* 197*  BILITOT 3.6* 2.5* 2.4*  PROT 7.8 6.2* 6.5  ALBUMIN 2.9* 2.3* 2.5*   Recent Labs  Lab 05/04/23 1947  LIPASE 31   Recent Labs  Lab 05/05/23 0132 05/09/23 0505  AMMONIA 59* 58*   Coagulation Profile: Recent Labs  Lab 05/05/23 0132 05/09/23 0505  INR 1.5* 1.5*   Cardiac Enzymes: No  results for input(s): "CKTOTAL", "CKMB", "CKMBINDEX", "TROPONINI" in the last 168 hours. BNP (last 3 results) No results for input(s): "PROBNP" in the last 8760 hours. HbA1C: No results for input(s): "HGBA1C" in the last 72 hours.  CBG: Recent Labs  Lab 05/07/23 0836 05/08/23 0759 05/08/23 1122 05/08/23 1623 05/09/23 1228  GLUCAP 126* 134* 92 123* 136*   Lipid Profile: No results for input(s): "CHOL", "HDL", "LDLCALC", "TRIG", "CHOLHDL", "LDLDIRECT" in the last 72 hours.  Thyroid Function Tests: No results for input(s): "TSH", "T4TOTAL", "FREET4", "T3FREE", "THYROIDAB" in the last 72 hours. Anemia Panel: No results for input(s): "VITAMINB12", "FOLATE", "FERRITIN", "TIBC", "IRON", "RETICCTPCT" in the last 72 hours. Sepsis Labs: Recent Labs  Lab 05/05/23 0956  LATICACIDVEN 1.2    Recent Results (from the past 240 hour(s))  Urine Culture     Status: Abnormal   Collection Time: 05/05/23  1:31 AM   Specimen: Urine, Clean Catch  Result Value Ref Range Status   Specimen Description   Final    URINE, CLEAN CATCH Performed at [facility], [address], [place], [place] [address]    Special Requests   Final    NONE Reflexed from [id number] Performed at [facility], [address] [place], [place] [address]    Culture >=100,000 COLONIES/mL HAFNIA SPECIES (A)  Final   Report Status 05/07/2023 FINAL  Final   Organism ID, Bacteria HAFNIA SPECIES (A)  Final      Susceptibility   Hafnia species - MIC*    AMPICILLIN >=32 RESISTANT Resistant     CIPROFLOXACIN <=0.25 SENSITIVE Sensitive     GENTAMICIN <=1 SENSITIVE Sensitive     IMIPENEM <=0.25 SENSITIVE Sensitive     NITROFURANTOIN <=16 SENSITIVE Sensitive     TRIMETH/SULFA <=20 SENSITIVE Sensitive     AMPICILLIN/SULBACTAM >=32 RESISTANT Resistant     PIP/TAZO >=128 RESISTANT Resistant     * >=100,000 COLONIES/mL HAFNIA SPECIES         Radiology Studies: PERIPHERAL VASCULAR CATHETERIZATION  Result Date:  05/09/2023 See surgical note for result.  MR LUMBAR SPINE WO CONTRAST  Result Date: 05/07/2023 CLINICAL DATA:  Worsening low back pain with difficulty walking EXAM: MRI LUMBAR SPINE WITHOUT CONTRAST TECHNIQUE: Multiplanar, multisequence MR imaging of the lumbar spine was performed. No intravenous contrast was administered. COMPARISON:  12/08/2020 FINDINGS: Segmentation:  Standard. Alignment:  Physiologic. Vertebrae:  No fracture, evidence of  discitis, or bone lesion. Conus medullaris and cauda equina: Conus extends to the L1 level. Conus and cauda equina appear normal. Paraspinal and other soft tissues: Negative. Disc levels: L1-L2: Normal disc space and facet joints. No spinal canal stenosis. No neural foraminal stenosis. L2-L3: Normal disc space and facet joints. No spinal canal stenosis. No neural foraminal stenosis. L3-L4: Small disc bulge. No spinal canal stenosis. No neural foraminal stenosis. L4-L5: Unchanged small central disc protrusion. No spinal canal stenosis. No neural foraminal stenosis. L5-S1: Small central disc protrusion. No spinal canal stenosis. Mild left neural foraminal stenosis. Visualized sacrum: Normal. IMPRESSION: 1. Unchanged small central disc protrusions at L4-L5 and L5-S1 without spinal canal stenosis. 2. Mild left L5-S1 neural foraminal stenosis. Electronically Signed   By: Deatra Robinson M.D.   On: 05/07/2023 23:19        Scheduled Meds:  [START ON 05/10/2023] clopidogrel  75 mg Oral Q breakfast   folic acid  1 mg Oral Daily   gabapentin  100 mg Oral BID   hydrocortisone  10 mg Oral Daily   iohexol  500 mL Oral Q1H   lactulose  30 g Oral TID   lidocaine  1 patch Transdermal Q24H   midodrine  10 mg Oral TID WC   multivitamin with minerals  1 tablet Oral Daily   nicotine  21 mg Transdermal Daily   pantoprazole  40 mg Oral Daily   QUEtiapine  50 mg Oral QHS   rifaximin  550 mg Oral BID   rosuvastatin  40 mg Oral QHS   sodium chloride flush  3 mL Intravenous Q12H    thiamine  100 mg Oral Daily   Or   thiamine  100 mg Intravenous Daily   Continuous Infusions:  sodium chloride        LOS: 4 days     Silvano Bilis, MD [facility]   If 7PM-7AM, please contact night-coverage  05/09/2023, 2:44 PM

## 2023-05-09 NOTE — Progress Notes (Signed)
PT complaining of new onset chest pain that is "sharp" and rates in 10/10. Dr. Ashok Pall made aware. Orders for EKG, obtained EKG.  PT began stating later that pain was going through to back. Dr. Ashok Pall made aware of change and came to bedside to evaluate pt. See MAR.  PT returned to inpatient room in stable condition, but continued pain. Receiving RN made aware.

## 2023-05-09 NOTE — Progress Notes (Signed)
Pharmacy Antibiotic Note  Ethan Wells is a 52 y.o. male admitted on 05/05/2023 with  cholecystitis . History of polysubstance abuse, cirrhosis a/p TIP who was admitted with ischemic bowel s/p stent SMA. Pharmacy has been consulted for vancomycin dosing.  Received vancomycin 1,000 mg x 1 on 5/24 @0800 . Estimated T1/2 is 8 hours  Plan: Vancomycin 1,750 mg x 1 loading dose (accounting for 1 gram dose given this AM. 13 hours since previous dose).  Vancomycin IV 750 mg every 12 hours Goal AUC 400-550 Estimated AUC 429.5, Cmin 11.3   Height: 5\' 5"  (165.1 cm) Weight: 83 kg (182 lb 15.7 oz) IBW/kg (Calculated) : 61.5  Temp (24hrs), Avg:98.4 F (36.9 C), Min:97.5 F (36.4 C), Max:99.1 F (37.3 C)  Recent Labs  Lab 05/04/23 1947 05/05/23 0956 05/05/23 1313 05/06/23 0152 05/06/23 0558 05/06/23 0811 05/07/23 0519 05/09/23 0505 05/09/23 1917 05/09/23 1921  WBC 8.3 5.3   < > 4.9  --  4.7 5.1 7.3  --  10.0  CREATININE 0.80  --   --   --  0.78  --  0.70 0.57*  --  0.57*  LATICACIDVEN  --  1.2  --   --   --   --   --   --  1.6  --    < > = values in this interval not displayed.    Estimated Creatinine Clearance: 109.5 mL/min (A) (by C-G formula based on SCr of 0.57 mg/dL (L)).    Allergies  Allergen Reactions   Penicillins Hives and Rash    Tolerated Ceftriaxone 10/2022    Antimicrobials this admission: metronidazole 5/24 >>  aztreonam 5/24 >>   Dose adjustments this admission: N/a  Microbiology results: 5/24 BCx: ordered 5/20 UCx: Hafnia species    Thank you for allowing pharmacy to be a part of this patient's care.  Jaynie Bream 05/09/2023 8:45 PM

## 2023-05-09 NOTE — Progress Notes (Signed)
PT Cancellation Note  Patient Details Name: Ethan Wells MRN: [id number] DOB: 1971-01-01   Cancelled Treatment:    Reason Eval/Treat Not Completed: Patient at procedure or test/unavailable Patient is off unit for vascular procedure. Will re-attempt at later date/time as able   [name] 05/09/2023, 10:17 AM

## 2023-05-09 NOTE — Op Note (Signed)
[facility]  Percutaneous Study/Intervention Procedural Note   Date of Surgery: 05/09/2023  Surgeon(s): Levora Dredge    Assistants:None  Pre-operative Diagnosis:   Acute on chronic mesenteric ischemia. Restenosis previous superior mesenteric artery stent.   Post-operative diagnosis:  Same  Procedure(s) Performed:             1.  Ultrasound guidance for vascular access right femoral artery             2.  Catheter placement into SMA from right femoral approach             3.  Selective SMA angiogram             4.  Balloon expandable stent placement to the superior mesenteric artery using two Lifestream balloon expandable stents, 7 mm x 26 mm stent and a 7 mm x 16 mm stent postdilated to 8 mm             5.  Catheter placement into celiac and common hepatic artery from right femoral approach             6.  Selective celiac and hepatic artery angiogram             7.  Balloon expandable stent placement to the superior mesenteric artery using Lifestream balloon expandable stent, 7 mm x 16 mm stent.              8.  StarClose closure device right femoral artery  Anesthesia: Continuous ECG pulse oximetry and cardiopulmonary monitoring was performed throughout the entire procedure by the interventional radiology nurse.  Parenteral Versed and fentanyl were administered.  Total sedation time was 50 minutes and 16 seconds.  Fluoro Time: 9.5 minutes  Contrast: 105 cc              Indications:  Patient presents to [facility] with worsening abdominal pain.  He has numerous medical problems.  In the past he has been treated for mesenteric ischemia and is undergoing stenting of his SMA.  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  Angiogram is performed to evaluate the lesion more thoroughly and potentially allow treatment. Risks and  benefits were discussed and informed consent was obtained  Procedure:  The patient was identified and appropriate procedural time out was performed.  The patient was then placed supine on the table and prepped and draped in the usual sterile fashion. Moderate conscious sedation was administered during a face to face encounter with the patient with the RN monitoring their vital signs, mental status, telemetry and pulse oximetry throughout the procedure.  Ultrasound was used to evaluate the right common femoral artery.  It was patent .  A micropuncture needle was used to access the right common femoral artery under direct ultrasound guidance and a permanent image was performed.  Microwire is then inserted followed by microsheath.  A 0.035 J wire was advanced without resistance and a 5Fr sheath was placed.    Pigtail catheter was then placed into the aorta and initially an AP aortogram was performed which showed greater than 70% stenosis of both the SMA and the celiac arteries. Lateral projection view was performed which showed typical orientations of the celiac and SMA.  I then selective cannulated the SMA with a V S1. Selective imaging of the superior mesenteric artery demonstrated greater than 70% stenosis throughout  the stent which extends approximately 1 cm distal to the existing stent.   The patient was then given 6000 units of intravenous heparin and I exchanged for a 6 French sheath and the Glidewire was used to navigate across the lesion without difficulty and the Glidewire was exchanged for a supra core wire.  A 7 mm x 40 mm Lutonix drug-eluting balloon was then used to predilate the in-stent restenosis of the superior mesenteric artery.  Inflation was to 8 to 10 atm for approximately 1 minute.  I then selected a 7 mm diameter by 26 mm length Lifestream balloon expandable stent and deployed this across the stenosis covering the distal aspect of the restenosis and then a 7 mm x 16 mm Lifestream balloon  expandable stent was added to cover the ostia extending several millimeters into the aorta.  Both of the stents were then simultaneously dilated using an 8 mm x 60 mm Lutonix drug-eluting balloon inflated to 8 atm for approximately 1 minute.  Follow-up imaging in multiple views demonstrated wide patency of the stents with less than 10% residual stenosis and preservation of the distal branches.    The wire was then removed from the SMA and the V S1 reinserted and the supra core wire exchanged for the stiff angled Glidewire.  Using the V S1 catheter and stiff angled Glidewire I selected the celiac.  I then returned to the AP projection hand-injection through the V S1 catheter then demonstrated the hepatic and splenic arteries and I negotiated the wire into the distal hepatic artery.  I returned the detector to the lateral position and after performing multiple different views so as to determine the bifurcation versus the ostial lesion once proper imaging and been obtained I selected a 7 mm x 16 mm Lifestream stent and deployed this across the ostial stenosis of the celiac.  Inflation was to 10 atm for for approximately 30 seconds.  Follow-up imaging demonstrated wide patency of the celiac with preservation of both the splenic and the hepatic arteries.  There is now less than 10% residual stenosis.  The wire was removed from the celiac and oblique arteriogram was performed of the right femoral artery. StarClose closure device was deployed in the usual fashion with excellent hemostatic result.  Findings:               Aortogram: Lateral imaging the aorta demonstrates greater than 70% stenosis at the ostia of both the celiac as well as the SMA.  The SMA lesion appears to be primarily in the previously stented segment.             SMA: The SMA is greater than 70% stenotic in its origin and within the stent.  Distal to this stent there is a greater than 80% stenosis extending for approximately 1 cm.  Beyond this  the SMA appears widely patent with its typical branching pattern.  Following deployment of the stents as described there is less than 10% residual stenosis.               Celiac: Imaging of the celiac demonstrates the focal greater than 70% stenosis at the ostia.  Once the proper angle have been identified the bifurcation of the hepatic and the splenic artery is well-visualized.  A 7 mm stent is then deployed within the celiac trunk maintaining patency of both the splenic and the hepatic with less than 10% residual stenosis.  Disposition: Patient was taken to the recovery room in stable condition having tolerated the procedure well.   Levora Dredge 05/09/2023 10:29 AM   This note was created with Dragon Medical transcription system. Any errors in dictation are purely unintentional.

## 2023-05-09 NOTE — Progress Notes (Signed)
       CROSS COVER NOTE  NAME: Ethan Wells MRN: [id number] DOB : 05-05-1971    HPI/Events of Note   Report:sign out from Dr Ashok Pall patient with history of polysubstance abuse, cirrhosis a/p TIP who was admitted with ischemic bowel s/p stent SMA today who developed worsening abd pain nausea and vomiting today. CT to eval showed ileus, constipation and possible cholecytstitis,. RUQ u/s and labs pending  On review of chart:Low grade fever at 99.1. Patient slightly clammy and warm but reports chills. BP heart rate stable    Assessment and  Interventions   Assessment: Bedside patient somewhat lethargic and mumbles but appropriate. Endorses abdominal pain. Reports vomiting only twice Abdomen slightly distended and reports pain with light palpation. Not firm Offered NG, patient refused denies nausea at this time.   Respirations shallow, 20s, diminished bases, otherwise clear Sats stable on room air Heart rate 90's SR on monitor  Plan: Keep npo Cbg every 4h CT and ultrasound findings discussed with Dr Joellen Jersey, gen surgery, and with Dr Roxy Horseman IR. Ultimately no intervention at this time planned except conservative management unless gallbladder becomes more distended, then drain in IR may be placed.  Blood cultures Transfer to stepdown/ discussed patient with PCCM, will place line if need Azactam, metronidazole and vanc Blood cultures PICC if central line is not placed during night - patient aware and agrees       Donnie Mesa NP [facility] Cross Cover 7pm-7am - check amion for availability Pager [phone]

## 2023-05-09 NOTE — Progress Notes (Signed)
Patient is alert and oriented x3. Ambulates x1 w/assistance. Complained of back pain last night and received tylenol. Patient received ativan x1 for a CIWA score of 5 due to anxiety and sweating. He is not satisfied with his pain regimen. Patient called early morning asking for pain med, however, when I went back into room patient was asleep. He called the nurse's station slurring his words asking for pain medication. When I informed him that he only had tylenol, he declined. Rounded on patient after an hour and he was asleep.

## 2023-05-09 NOTE — Progress Notes (Signed)
[facility]  Daily Progress Note   Subjective  - Day of Surgery  Patient is sitting up in bed he does not appear to be in any significant distress.  He responds to questions with mumbling.  The only thing he continues to say is he is in pain now he says his liver hurts.  Objective Vitals:   05/09/23 1214 05/09/23 1216 05/09/23 1530 05/09/23 1625  BP: (!) 95/54 (!) 114/59 106/62 123/60  Pulse: 94 92 97 (!) 101  Resp: 18  20 20   Temp: (!) 97.5 F (36.4 C)  98.2 F (36.8 C) 98.8 F (37.1 C)  TempSrc: Oral     SpO2: 100% 100% 100% 99%  Weight:      Height:        Intake/Output Summary (Last 24 hours) at 05/09/2023 1809 Last data filed at 05/09/2023 1709 Gross per 24 hour  Intake 393.91 ml  Output 2600 ml  Net -2206.09 ml    PULM  Normal effort , no use of accessory muscles CV  No JVD, RRR Abd      distended distended but unchanged Neuro   patient is  lethargic  Laboratory CBC    Component Value Date/Time   WBC 7.3 05/09/2023 0505   HGB 9.8 (L) 05/09/2023 0505   HGB 15.0 09/06/2013 1458   HCT 30.5 (L) 05/09/2023 0505   HCT 42.2 09/06/2013 1458   PLT 106 (L) 05/09/2023 0505   PLT 270 09/06/2013 1458    BMET    Component Value Date/Time   NA 135 05/09/2023 0505   NA 134 (L) 09/06/2013 1458   K 3.5 05/09/2023 0505   K 3.5 09/06/2013 1458   CL 110 05/09/2023 0505   CL 102 09/06/2013 1458   CO2 19 (L) 05/09/2023 0505   CO2 23 09/06/2013 1458   GLUCOSE 129 (H) 05/09/2023 0505   GLUCOSE 167 (H) 09/06/2013 1458   BUN 7 05/09/2023 0505   BUN 4 (L) 09/06/2013 1458   CREATININE 0.57 (L) 05/09/2023 0505   CREATININE 0.49 (L) 09/23/2022 1034   CALCIUM 8.3 (L) 05/09/2023 0505   CALCIUM 8.9 09/06/2013 1458   GFRNONAA >60 05/09/2023 0505   GFRNONAA >60 09/06/2013 1458   GFRAA >60 06/05/2020 1109   GFRAA >60 09/06/2013 1458    Assessment/Planning:  Patient is status post revascularization of his SMA and celiac with stenting of both  arteries.  Post procedure he complained of pain in multiple locations.  CT scan is still pending.  However this does not appear to be different from his baseline complaints of pain so that he can continue to receive narcotics.   Levora Dredge  05/09/2023, 6:09 PM

## 2023-05-09 NOTE — Interval H&P Note (Signed)
History and Physical Interval Note:  05/09/2023 11:56 AM  Ethan Wells  has presented today for surgery, with the diagnosis of mesenteric Iscemia.  The various methods of treatment have been discussed with the patient and family. After consideration of risks, benefits and other options for treatment, the patient has consented to  Procedure(s): VISCERAL ARTERY INTERVENTION (N/A) as a surgical intervention.  The patient's history has been reviewed, patient examined, no change in status, stable for surgery.  I have reviewed the patient's chart and labs.  Questions were answered to the patient's satisfaction.     Levora Dredge

## 2023-05-10 ENCOUNTER — Other Ambulatory Visit: Payer: Self-pay

## 2023-05-10 DIAGNOSIS — K922 Gastrointestinal hemorrhage, unspecified: Secondary | ICD-10-CM | POA: Diagnosis not present

## 2023-05-10 DIAGNOSIS — K810 Acute cholecystitis: Secondary | ICD-10-CM | POA: Insufficient documentation

## 2023-05-10 DIAGNOSIS — K811 Chronic cholecystitis: Secondary | ICD-10-CM

## 2023-05-10 LAB — GLUCOSE, CAPILLARY
Glucose-Capillary: 94 mg/dL (ref 70–99)
Glucose-Capillary: 91 mg/dL (ref 70–99)
Glucose-Capillary: 126 mg/dL — ABNORMAL HIGH (ref 70–99)
Glucose-Capillary: 99 mg/dL (ref 70–99)

## 2023-05-10 LAB — MAGNESIUM: Magnesium: 1.5 mg/dL — ABNORMAL LOW (ref 1.7–2.4)

## 2023-05-10 LAB — COMPREHENSIVE METABOLIC PANEL
ALT: 32 U/L (ref 0–44)
AST: 79 U/L — ABNORMAL HIGH (ref 15–41)
Albumin: 2.4 g/dL — ABNORMAL LOW (ref 3.5–5.0)
Alkaline Phosphatase: 200 U/L — ABNORMAL HIGH (ref 38–126)
Anion gap: 7 (ref 5–15)
BUN: 11 mg/dL (ref 6–20)
CO2: 17 mmol/L — ABNORMAL LOW (ref 22–32)
Calcium: 8.4 mg/dL — ABNORMAL LOW (ref 8.9–10.3)
Chloride: 112 mmol/L — ABNORMAL HIGH (ref 98–111)
Creatinine, Ser: 0.66 mg/dL (ref 0.61–1.24)
GFR, Estimated: >60 mL/min (ref >60)
Glucose, Bld: 93 mg/dL (ref 70–99)
Potassium: 3.3 mmol/L — ABNORMAL LOW (ref 3.5–5.1)
Sodium: 136 mmol/L (ref 135–145)
Total Bilirubin: 3.5 mg/dL — ABNORMAL HIGH (ref 0.3–1.2)
Total Protein: 6.5 g/dL (ref 6.5–8.1)

## 2023-05-10 LAB — CBC WITH DIFFERENTIAL/PLATELET
Abs Immature Granulocytes: 0.05 10*3/uL (ref 0.00–0.07)
Basophils Absolute: 0 10*3/uL (ref 0.0–0.1)
Basophils Relative: 0 %
Eosinophils Absolute: 0.1 10*3/uL (ref 0.0–0.5)
Eosinophils Relative: 2 %
HCT: 29.6 % — ABNORMAL LOW (ref 39.0–52.0)
Hemoglobin: 9.6 g/dL — ABNORMAL LOW (ref 13.0–17.0)
Immature Granulocytes: 1 %
Lymphocytes Relative: 16 %
Lymphs Abs: 1.1 10*3/uL (ref 0.7–4.0)
MCH: 28.8 pg (ref 26.0–34.0)
MCHC: 32.4 g/dL (ref 30.0–36.0)
MCV: 88.9 fL (ref 80.0–100.0)
Monocytes Absolute: 0.5 10*3/uL (ref 0.1–1.0)
Monocytes Relative: 8 %
Neutro Abs: 5.1 10*3/uL (ref 1.7–7.7)
Neutrophils Relative %: 73 %
Platelets: 102 10*3/uL — ABNORMAL LOW (ref 150–400)
RBC: 3.33 MIL/uL — ABNORMAL LOW (ref 4.22–5.81)
RDW: 18.5 % — ABNORMAL HIGH (ref 11.5–15.5)
WBC: 7 10*3/uL (ref 4.0–10.5)
nRBC: 0 % (ref 0.0–0.2)

## 2023-05-10 LAB — APTT: aPTT: 44 seconds — ABNORMAL HIGH (ref 24–36)

## 2023-05-10 LAB — PROTIME-INR
INR: 1.6 — ABNORMAL HIGH (ref 0.8–1.2)
Prothrombin Time: 18.9 seconds — ABNORMAL HIGH (ref 11.4–15.2)

## 2023-05-10 LAB — BLOOD GAS, VENOUS
Acid-base deficit: 5.4 mmol/L — ABNORMAL HIGH (ref 0.0–2.0)
Bicarbonate: 17 mmol/L — ABNORMAL LOW (ref 20.0–28.0)
O2 Saturation: 94.4 %
Patient temperature: 37
pCO2, Ven: 25 mmHg — ABNORMAL LOW (ref 44–60)
pH, Ven: 7.44 — ABNORMAL HIGH (ref 7.25–7.43)
pO2, Ven: 63 mmHg — ABNORMAL HIGH (ref 32–45)

## 2023-05-10 MED ORDER — LACTULOSE ENEMA
300.0000 mL | Freq: Once | ORAL | Status: AC
  Administered 2023-05-10: 300 mL via RECTAL
  Filled 2023-05-10: qty 300

## 2023-05-10 MED ORDER — POTASSIUM CHLORIDE CRYS ER 20 MEQ PO TBCR
40.0000 meq | EXTENDED_RELEASE_TABLET | Freq: Once | ORAL | Status: AC
  Administered 2023-05-10: 40 meq via ORAL
  Filled 2023-05-10: qty 2

## 2023-05-10 MED ORDER — SODIUM CHLORIDE 0.9 % IV SOLN: INTRAVENOUS | Status: DC

## 2023-05-10 MED ORDER — POTASSIUM CHLORIDE CRYS ER 20 MEQ PO TBCR
40.0000 meq | EXTENDED_RELEASE_TABLET | Freq: Once | ORAL | Status: DC
  Filled 2023-05-10: qty 2

## 2023-05-10 MED ORDER — MAGNESIUM SULFATE 4 GM/100ML IV SOLN
4.0000 g | Freq: Once | INTRAVENOUS | Status: AC
  Administered 2023-05-10: 4 g via INTRAVENOUS
  Filled 2023-05-10: qty 100

## 2023-05-10 MED ORDER — POTASSIUM CHLORIDE 10 MEQ/100ML IV SOLN
10.0000 meq | INTRAVENOUS | Status: DC
  Administered 2023-05-10: 10 meq via INTRAVENOUS
  Filled 2023-05-10 (×2): qty 100

## 2023-05-10 MED ORDER — SODIUM CHLORIDE 0.9 % IV SOLN
2.0000 g | Freq: Three times a day (TID) | INTRAVENOUS | Status: DC
  Administered 2023-05-10 – 2023-05-12 (×6): 2 g via INTRAVENOUS
  Filled 2023-05-10 (×8): qty 12.5

## 2023-05-10 MED ORDER — KCL IN DEXTROSE-NACL 20-5-0.45 MEQ/L-%-% IV SOLN
INTRAVENOUS | Status: DC
  Filled 2023-05-10: qty 1000

## 2023-05-10 MED ORDER — ALBUMIN HUMAN 25 % IV SOLN: 25.0000 g | Freq: Once | INTRAVENOUS | Status: DC

## 2023-05-10 MED ORDER — CHLORHEXIDINE GLUCONATE CLOTH 2 % EX PADS: 6.0000 | MEDICATED_PAD | Freq: Every day | CUTANEOUS | Status: DC

## 2023-05-10 NOTE — Progress Notes (Signed)
Pt transferred out of ICU. Pt with 2 PIV's working well per ICU RN.  Secure chat with Dr Ashok Pall re PICC line, meds, no continuous IVF.  Dr Ashok Pall canceled PICC line order.

## 2023-05-10 NOTE — Progress Notes (Addendum)
PROGRESS NOTE    Ethan Wells  GNF:[id number] DOB: 1971/07/28 DOA: 05/05/2023 PCP: [name], NP    Brief Narrative:  52 y.o. male with medical history significant of alcohol abuse, alcoholic liver cirrhosis with portal gastropathy, s/p of TIPS, superior mesenteric artery stenosis, HTN, HLd, CAD, STEMI, dCHF, GERD, depression with anxiety, adrenal insufficiency (Addison's disease), migraine headache, cardiac arrest due to V-fib, recent admission due to GI bleeding, cocaine abuse, tobacco abuse, who presents with chest pain, abdominal pain, rectal bleeding.   Patient was recently hospitalized from 4/22 - 5/13 due to upper GI bleeding. Pt is s/p of EGD with banding on 4/23 and s/p of TIPS procedure on 4/27. Pt states that he continues to use cocaine and drinking alcohol.  He states that he has chest pain since yesterday, which is located in front and right side of chest, intermittent, come and goes, morbid, both sharp and dull pain, nonradiating.  Associated with dry cough and mild shortness breath.  He also reports abdominal pain, which is diffuse, constant, aching, moderate, nonradiating.    Seen in consultation by vascular surgery and gastroenterology.  Gastroenterology not planning any endoluminal evaluation.  Vascular surgery is recommending angiography with possible reintervention of SMA and celiac.  Nonemergent.  Tentatively planned for Friday 5/23  Patient is significantly lethargic but continues to request IV narcotics.  High suspicion for drug-seeking behavior.   Assessment & Plan:   Principal Problem:   GI bleeding Active Problems:   Anemia due to GI blood loss   Adrenal insufficiency (Addison's disease) (HCC)   Alcoholic cirrhosis (HCC)   Abdominal pain   Mesenteric artery stenosis (HCC)   Chest pain   CAD (coronary artery disease)   UTI (urinary tract infection)   HLD (hyperlipidemia)   Chronic diastolic CHF (congestive heart failure) (HCC)    Hypomagnesemia   Hypokalemia   Essential hypertension   Depression with anxiety   Tobacco abuse   Cocaine abuse (HCC)   Polysubstance abuse (HCC)   Alcohol abuse  Acute cholecystitis Worsening abdominal pain and two episodes vomiting after vascular procedure on 5/24. CT suggestive of acute cholecystitis and ruq u/s confirms this. Started on abx, gen surg consulted. Hemodynamically stable - f/u gen surg and IR consults - stop aztreonam and vanc, continue flagyl, start cefepime - f/u blood cultures - npo for now, gentle fluids  Ileus, partial Likely 2/2 above. No vomiting overnight - npo for now  GI bleeding and anemia due to GI blood loss Hemorrhoidal bleeding Hgb stable in the 9s.   GI evaluated patient, thinks likely hemorrhoidal.  No plans for endoscopic evaluation currently.Marland Kitchen admitted last month for upper gi bleed 2/2 variceal bleed treated with banding and tips.  Plan: Continue PPI for now Monitor hemoglobin Transfuse as needed less than 7 Anusol twice daily as needed  Adrenal insufficiency (Addison's disease) (HCC) -continue home Cortef 5 mg daily  Low back pain This is his primary compliant. Says it's chronic but worsening and now associated with gait dysfunction. MRI of lumbar spine 5/22 shows small disc protrusions, no stenosis, mild foraminal stenosis. - pt advising home health   Alcoholic cirrhosis (HCC):  Status post TIPS INR 1.5, ammonia 59, mental status poor but likely attributed to narcotics Plan: -Lactulose 20 g 3 times daily, increased to 30 tid, BMs still not appropriate, will give lactulose enema today -Rifaximin -Hold Lasix and spironolactone due to soft blood pressure, restart as appropriate   Constipation Despite up-titration in lactulose, will give lactulose enema  today   Abdominal pain and superior mesenteric artery stenosis  Etiology for his abdominal pain is not clear.  Potential differential diagnosis include superior mesenteric artery  stenosis, SBP, UTI.  CT scan showed distended gallbladder, but US-RUQ has no evidence of cholecystitis.  Patient does not have fever or leukocytosis.  Does not seem to have acute cholecystitis.  Patient has history of superior mesenteric artery stenosis, CT scan also showed some abnormalities in the SMA.  Vascular performed percutaneous procedure 5/24 with several stents to the SMA. - plavix on hold, will reach out to vascular about this - cont statin  Hypokalemia Hypomagenesmia Repleting today   HLD (hyperlipidemia) -Crestor   Chronic diastolic CHF (congestive heart failure) (HCC)  2D echo on 04/11/2023 showed EF of 55-60% with grade 2 diastolic dysfunction.  Patient mild plus leg edema, elevated BNP 426, but no oxygen desaturation.  Chest x-ray negative for pulmonary edema.  Does not seem to have acute CHF exacerbation, but patient had a high risk of developing CHF exacerbation.   -Hold Lasix and spironolactone as above  Essential hypertension Blood pressure soft with maps in upper 60s -Hold all blood pressure medications and diuretics due to soft blood pressure -Midodrine 10 mg 3 times daily, wean as tolerated   Depression with anxiety -Continue home medications   Polysubstance abuse Cocaine, alcohol, tobacco -Did counseling about importance of quitting substance use -Nicotine patch -CIWA protocol, no signs withdrawal   DVT prophylaxis: SCDs Code Status: Full Family Communication: None Disposition Plan: Status is: Inpatient Remains inpatient appropriate because: ongoing w/u of abdominal pain   Level of care: Stepdown  Consultants:  GI Vascular Gen surg  Procedures:  None  Antimicrobials: See above   Subjective: Seen and examined.  Main pain is at IV site where KCl is running  Objective: Vitals:   05/10/23 0650 05/10/23 0700 05/10/23 0730 05/10/23 0800  BP: (!) 99/55  (!) 104/52   Pulse: 92 91 85   Resp: 16 17 12    Temp:    97.9 F (36.6 C)  TempSrc:     Oral  SpO2: 98% 96% 98%   Weight:      Height:        Intake/Output Summary (Last 24 hours) at 05/10/2023 0819 Last data filed at 05/10/2023 0422 Gross per 24 hour  Intake 703.91 ml  Output 700 ml  Net 3.91 ml   Filed Weights   05/08/23 0500 05/09/23 0445 05/10/23 0344  Weight: 85.4 kg 83 kg 82.8 kg    Examination:  General exam: Lethargic. Respiratory system: Bibasilar crackles.  Normal work of breathing.  Room air Cardiovascular system: S1-S2, RRR, no murmurs, no pedal edema Gastrointestinal system: Soft, mildly distended, normal bowel sounds, diffuse mild generalized tenderness Central nervous system: Alert but lethargic.  Umoving all 4 Back: no midline point tenderness Extremities: Unable to assess power.  Gait not assessed Skin: No rashes, lesions or ulcers Psychiatry: flat affect    Data Reviewed: I have personally reviewed following labs and imaging studies  CBC: Recent Labs  Lab 05/06/23 0811 05/07/23 0519 05/09/23 0505 05/09/23 1921 05/10/23 0559  WBC 4.7 5.1 7.3 10.0 7.0  NEUTROABS  --  2.8  --  8.3* 5.1  HGB 9.6* 9.1* 9.8* 10.7* 9.6*  HCT 30.6* 29.0* 30.5* 32.5* 29.6*  MCV 90.0 90.6 87.9 86.7 88.9  PLT 166 142* 106* 110* 102*   Basic Metabolic Panel: Recent Labs  Lab 05/05/23 0131 05/05/23 2310 05/06/23 0558 05/07/23 0519 05/09/23 0505 05/09/23 1921  05/10/23 0559  NA  --   --  139 139 135 132* 136  K  --    < > 3.9 3.3* 3.5 3.5 3.3*  CL  --   --  113* 111 110 105 112*  CO2  --   --  21* 21* 19* 17* 17*  GLUCOSE  --   --  137* 134* 129* 108* 93  BUN  --   --  <5* <5* 7 9 11   CREATININE  --   --  0.78 0.70 0.57* 0.57* 0.66  CALCIUM  --   --  8.3* 8.0* 8.3* 8.5* 8.4*  MG 1.5*  --  2.1  --   --   --  1.5*  PHOS 2.6  --   --   --   --   --   --    < > = values in this interval not displayed.   GFR: Estimated Creatinine Clearance: 109.4 mL/min (by C-G formula based on SCr of 0.66 mg/dL). Liver Function Tests: Recent Labs  Lab  05/04/23 1947 05/06/23 0558 05/09/23 0505 05/09/23 1921 05/10/23 0559  AST 51* 53* 79* 96* 79*  ALT 24 22 31  37 32  ALKPHOS 161* 143* 197* 229* 200*  BILITOT 3.6* 2.5* 2.4* 4.5* 3.5*  PROT 7.8 6.2* 6.5 6.9 6.5  ALBUMIN 2.9* 2.3* 2.5* 2.6* 2.4*   Recent Labs  Lab 05/04/23 1947 05/09/23 1921  LIPASE 31 37   Recent Labs  Lab 05/05/23 0132 05/09/23 0505  AMMONIA 59* 58*   Coagulation Profile: Recent Labs  Lab 05/05/23 0132 05/09/23 0505 05/10/23 0559  INR 1.5* 1.5* 1.6*   Cardiac Enzymes: No results for input(s): "CKTOTAL", "CKMB", "CKMBINDEX", "TROPONINI" in the last 168 hours. BNP (last 3 results) No results for input(s): "PROBNP" in the last 8760 hours. HbA1C: No results for input(s): "HGBA1C" in the last 72 hours.  CBG: Recent Labs  Lab 05/09/23 1228 05/09/23 1943 05/09/23 2313 05/10/23 0332 05/10/23 0741  GLUCAP 136* 103* 90 94 91   Lipid Profile: No results for input(s): "CHOL", "HDL", "LDLCALC", "TRIG", "CHOLHDL", "LDLDIRECT" in the last 72 hours.  Thyroid Function Tests: No results for input(s): "TSH", "T4TOTAL", "FREET4", "T3FREE", "THYROIDAB" in the last 72 hours. Anemia Panel: No results for input(s): "VITAMINB12", "FOLATE", "FERRITIN", "TIBC", "IRON", "RETICCTPCT" in the last 72 hours. Sepsis Labs: Recent Labs  Lab 05/05/23 0956 05/09/23 1917 05/09/23 2145  LATICACIDVEN 1.2 1.6 1.9    Recent Results (from the past 240 hour(s))  Urine Culture     Status: Abnormal   Collection Time: 05/05/23  1:31 AM   Specimen: Urine, Clean Catch  Result Value Ref Range Status   Specimen Description   Final    URINE, CLEAN CATCH Performed at [facility], [address], [place], [place] [address]    Special Requests   Final    NONE Reflexed from [id number] Performed at [facility], [address] [place], [place] [address]    Culture >=100,000 COLONIES/mL HAFNIA SPECIES (A)  Final   Report Status 05/07/2023 FINAL  Final   Organism  ID, Bacteria HAFNIA SPECIES (A)  Final      Susceptibility   Hafnia species - MIC*    AMPICILLIN >=32 RESISTANT Resistant     CIPROFLOXACIN <=0.25 SENSITIVE Sensitive     GENTAMICIN <=1 SENSITIVE Sensitive     IMIPENEM <=0.25 SENSITIVE Sensitive     NITROFURANTOIN <=16 SENSITIVE Sensitive     TRIMETH/SULFA <=20 SENSITIVE Sensitive  AMPICILLIN/SULBACTAM >=32 RESISTANT Resistant     PIP/TAZO >=128 RESISTANT Resistant     * >=100,000 COLONIES/mL HAFNIA SPECIES  Culture, blood (Routine X 2) w Reflex to ID Panel     Status: None (Preliminary result)   Collection Time: 05/09/23  9:45 PM   Specimen: BLOOD  Result Value Ref Range Status   Specimen Description BLOOD BLOOD LEFT WRIST  Final   Special Requests IN PEDIATRIC BOTTLE Blood Culture adequate volume  Final   Culture   Final    NO GROWTH < 12 HOURS Performed at [facility], [address], [place], [place] [address]    Report Status PENDING  Incomplete  Culture, blood (Routine X 2) w Reflex to ID Panel     Status: None (Preliminary result)   Collection Time: 05/09/23  9:45 PM   Specimen: BLOOD  Result Value Ref Range Status   Specimen Description BLOOD LEFT ANTECUBITAL  Final   Special Requests   Final    BOTTLES DRAWN AEROBIC ONLY Blood Culture results may not be optimal due to an inadequate volume of blood received in culture bottles   Culture   Final    NO GROWTH < 12 HOURS Performed at [facility], [address] [place], [place] [address]    Report Status PENDING  Incomplete         Radiology Studies: [place] EKG SITE RITE  Result Date: 05/10/2023 If Site Rite image not attached, placement could not be confirmed due to current cardiac rhythm.  US Abdomen Limited RUQ (LIVER/GB)  Result Date: 05/09/2023 CLINICAL DATA:  92631 Cholecystitis [id number] EXAM: ULTRASOUND ABDOMEN LIMITED RIGHT UPPER QUADRANT COMPARISON:  None Available.  CT abdomen pelvis 05/09/2023 FINDINGS: Gallbladder: Marked gallbladder  wall thickening (measuring up to 9 mm) and hyperemia. Gallbladder sludge and gallstones noted within the gallbladder lumen. Question trace pericholecystic fluid. Positive sonographic Murphy sign noted by sonographer. Common bile duct: Diameter: 5 mm. Liver: Nodular hepatic contour. No focal lesion identified. Patent tips. Within normal limits in parenchymal echogenicity. Portal vein is patent on color Doppler imaging with normal direction of blood flow towards the liver. Other: None. IMPRESSION: 1. Cholelithiasis and gallbladder sludge with associated acute cholecystitis. 2. Cirrhosis with patent tips. No focal liver lesions identified. Please note that liver protocol enhanced MR and CT are the most sensitive tests for the screening detection of hepatocellular carcinoma in the high risk setting of cirrhosis. Electronically Signed   By: Tish Frederickson M.D.   On: 05/09/2023 19:43   CT CHEST ABDOMEN PELVIS WO CONTRAST  Result Date: 05/09/2023 CLINICAL DATA:  Abdominal pain. Status post revascularization of SMA and celiac artery with stenting. EXAM: CT CHEST, ABDOMEN AND PELVIS WITHOUT CONTRAST TECHNIQUE: Multidetector CT imaging of the chest, abdomen and pelvis was performed following the standard protocol without IV contrast. RADIATION DOSE REDUCTION: This exam was performed according to the departmental dose-optimization program which includes automated exposure control, adjustment of the mA and/or kV according to patient size and/or use of iterative reconstruction technique. COMPARISON:  05/05/2023 FINDINGS: CT CHEST FINDINGS Cardiovascular: Heart size appears normal. Aortic atherosclerosis and coronary artery calcifications. No pericardial effusion identified. Mediastinum/Nodes: Thyroid gland, trachea, and esophagus are unremarkable. No enlarged mediastinal or axillary lymph nodes. Lungs/Pleura: Mild thickening along the posterior pleura overlying the lower lobes. No significant pleural effusion, airspace  consolidation or pneumothorax. Mild to moderate emphysema. Scarring identified within the lingula and posterior lung bases. Bilateral peripheral areas of interstitial reticulation and subpleural banding noted which likely  reflects chronic postinflammatory change. Stable subpleural nodule within the anterior right upper lobe measuring 4 mm, image 56/5. Subpleural nodule within the anterior right middle lobe is also unchanged measuring 5 mm. Musculoskeletal: Subacute to chronic right 6 anterior rib fracture, image 96/5. Possible acute fracture involving the anterior aspect of the right fifth rib, image 83/5. Remote left anterior third and fourth fractures identified. CT ABDOMEN PELVIS FINDINGS Hepatobiliary: Morphologic features of cirrhosis identified within the liver. Status post tips. No focal liver lesion identified. There is diffuse gallbladder wall thickening with surrounding inflammation. Gallbladder wall thickness measures up to 8 mm. No calcified gallstones identified. No signs of bile duct dilatation. Pancreas: Unremarkable. No pancreatic ductal dilatation or surrounding inflammatory changes. Spleen: Spleen measures 14.9 cm in cranial caudal dimension. No focal abnormality. Adrenals/Urinary Tract: Normal adrenal glands. Contrast material noted within the collecting system of both kidneys. No hydronephrosis or mass. Urinary bladder is filled with contrast material from procedure performed earlier today. No focal bladder abnormality. Stomach/Bowel: Stomach appears within normal limits. Small bowel loops are mildly dilated measuring up to 3.1 cm. No abrupt caliber change. Gradual decrease caliber of the small bowel loops noted distally. There is a moderate stool burden identified within the colon concerning for constipation. The appendix is not confidently identified. Vascular/Lymphatic: Aortic atherosclerosis. Stents identified within the celiac and SMA. No abdominopelvic adenopathy. Reproductive: Prostate is  unremarkable. Other: No significant free fluid or fluid collections. Postprocedural change identified within the right groin region. Musculoskeletal: No acute or significant osseous findings. IMPRESSION: 1. Diffuse gallbladder wall thickening and inflammation. Findings are concerning for acute cholecystitis. Consider further evaluation with right upper quadrant ultrasound. 2. Mild dilatation of small bowel loops without abrupt caliber change. Gradual decrease caliber of the small bowel loops noted distally. Findings are favored to represent ileus. 3. Moderate stool burden identified within the colon concerning for constipation. 4. Subacute to chronic right 6th anterior rib fracture. Possible acute fracture involving the anterior aspect of the right fifth rib. 5. Cirrhosis with splenomegaly. Status post TIPS. 6.  Aortic Atherosclerosis (ICD10-I70.0). Electronically Signed   By: Signa Kell M.D.   On: 05/09/2023 18:44   DG Chest Port 1 View  Result Date: 05/09/2023 CLINICAL DATA:  Chest pain. EXAM: PORTABLE CHEST 1 VIEW COMPARISON:  May 04, 2023. FINDINGS: Mild cardiomegaly. Both lungs are clear. The visualized skeletal structures are unremarkable. IMPRESSION: No active disease. Electronically Signed   By: Lupita Raider M.D.   On: 05/09/2023 16:08   PERIPHERAL VASCULAR CATHETERIZATION  Result Date: 05/09/2023 See surgical note for result.       Scheduled Meds:  Chlorhexidine Gluconate Cloth  6 each Topical Daily   folic acid  1 mg Oral Daily   gabapentin  100 mg Oral BID   hydrocortisone  10 mg Oral Daily   lactulose  30 g Oral TID   lidocaine  1 patch Transdermal Q24H   midodrine  10 mg Oral TID WC   multivitamin with minerals  1 tablet Oral Daily   nicotine  21 mg Transdermal Daily   pantoprazole  40 mg Oral Daily   potassium chloride  40 mEq Oral Once   QUEtiapine  50 mg Oral QHS   rifaximin  550 mg Oral BID   rosuvastatin  40 mg Oral QHS   sodium chloride flush  3 mL Intravenous  Q12H   thiamine  100 mg Oral Daily   Or   thiamine  100 mg Intravenous Daily   Continuous Infusions:  sodium chloride 250 mL (05/10/23 0759)   sodium chloride 50 mL/hr at 05/10/23 0633   ceFEPime (MAXIPIME) IV     magnesium sulfate bolus IVPB 4 g (05/10/23 0805)   metronidazole Stopped (05/09/23 2220)   potassium chloride 10 mEq (05/10/23 0803)      LOS: 5 days     Silvano Bilis, MD [facility]   If 7PM-7AM, please contact night-coverage  05/10/2023, 8:19 AM

## 2023-05-10 NOTE — Progress Notes (Signed)
Report called to [facility] RN. Will transport to room 131 after lactulose enema given.

## 2023-05-10 NOTE — Progress Notes (Signed)
Pt given lactulose enema and tolerated fairly well; passed large brownish yellow, clay colored soft BM. Held less than 5  minutes. Was able to use bedpan for large amount of return. Transported to room 131 via bed accompanied by nurse tech. Telemetry D/C'd per discussion with Dr. Ashok Pall.

## 2023-05-10 NOTE — Progress Notes (Signed)
 Interventional Radiology Brief Note:  Ethan Wells is a 52 year old male with history of cirrhosis, s/p TIPS procedure by Dr. Milford Cage 04/11/23.  Now admitted with abdominal pain.  Underwent celiac and SMA stenting with Vascular surgery yesterday. Abdominal pain ongoing, however appears to be chronic in nature.  Further assessment with US showed thickened, but decompressed gall bladder which may be related to chronic cholecystitis vs. Chronic liver disease with low albumin. IR consulted for possible percutaneous cholecystostomy tube placement.  Case reviewed by Dr. Archer Asa and Dr. Fredia Sorrow.  Due to  morbidity risk factors and his recent visceral intervention, recommend observation of clinical status, serial labs through at least early next week.  If clinical picture changes would obtain another RUQ [place] +/- HIDA scan.  Given his medical history and complexity, a perc cholecystostomy should be considered only if definitively cholecystitis.  Loyce Dys, MS RD PA-C

## 2023-05-10 NOTE — Consult Note (Signed)
Patient ID: Ethan Wells, male   DOB: [birth year], 52 y.o.   MRN: [id number]  HPI Ethan Wells is a 52 y.o. male seen in consultation at the request of Mrs Ethan Billings NP.   s/p TIPS procedure by Dr. Milford Cage 04/11/23.  Now admitted with abdominal pain.  Underwent celiac and SMA stenting with Vascular surgery yesterday.  He endorses chronic Abdominal pain that worsened over the last 48 hrs located RUQ moderate to severe, no specific alleviating or aggravating factors  Multiple comorbidities including alcoholic liver cirrhosis, portal hypertension, status post TIPS, coronary artery disease, STEMI, CHF. Patient was recently hospitalized from 4/22 - 5/13 due to upper GI bleeding. Pt is s/p of EGD with banding on 4/23 and s/p of TIPS procedure on 4/27  Did have 2 recent ultrasound showing evidence of gallstones and pericholecystic fluid and he also had a CT scan that I personally reviewed showing chronic inflammatory changes around his gallbladder with thickening of the gallbladder wall suggestive of chronic cholecystitis No ascites on CT Does have an INR of 1.6, and a CMP showing chronic acidosis with hypokalemia normal creatinine a total bilirubin of 3.5 with an increased alkaline phosphatase, alb 2.4 Hb 9.6 Pl 102k  HPI  Past Medical History:  Diagnosis Date   CAD (coronary artery disease)    a. 01/2011 Anterior STEMI/Cath/PCI: LM nl, LAD 100d (3.5x1mm Vision BMS placed), LCX 41m, RI 50, RCA min irregs, EF 40% w/ apical, inferoapical HK; b. 10/2022 NSTSEMI/Cath: LM nl, LAD 5p/m, RI 85, LCX 143m, OM1 mild dzs, OM3 100 fills via L->L collats from dLAD, RCA 80p, 143m, RPDA fills via collats from LAD. EF 45-50%-->Med Rx.   Cardiac arrest - ventricular fibrillation    a. In setting of STEMI 01/2011   Chronic pain    Cocaine abuse (HCC)    Depression with anxiety    ETOH abuse    a. 6-12 beers/day   GERD (gastroesophageal reflux disease)    GI bleed    Hemorrhoids    Hyperlipidemia     Hypertension    Ischemic cardiomyopathy    a. 06/2011 Echo: EF 45-50%, No rwma; b. 10/2022 Echo: EF 55-60%, no rwma, nl RV fxn.   Marijuana abuse    Migraines    Tobacco abuse    a. 1/2 ppd x 26 yrs    Past Surgical History:  Procedure Laterality Date   COLONOSCOPY  08/09/2021   Procedure: COLONOSCOPY;  Surgeon: Midge Minium, MD;  Location: [facility] ENDOSCOPY;  Service: Endoscopy;;   CORONARY ANGIOPLASTY WITH STENT PLACEMENT     ESOPHAGEAL BANDING  04/08/2023   Procedure: ESOPHAGEAL BANDING;  Surgeon: Tressia Danas, MD;  Location: [facility] ENDOSCOPY;  Service: Gastroenterology;;   ESOPHAGOGASTRODUODENOSCOPY N/A 05/30/2022   Procedure: ESOPHAGOGASTRODUODENOSCOPY (EGD);  Surgeon: Toney Reil, MD;  Location: [facility] ENDOSCOPY;  Service: Gastroenterology;  Laterality: N/A;   ESOPHAGOGASTRODUODENOSCOPY (EGD) WITH PROPOFOL N/A 03/01/2021   Procedure: ESOPHAGOGASTRODUODENOSCOPY (EGD) WITH PROPOFOL;  Surgeon: Toney Reil, MD;  Location: [facility];  Service: Endoscopy;  Laterality: N/A;   ESOPHAGOGASTRODUODENOSCOPY (EGD) WITH PROPOFOL N/A 08/09/2021   Procedure: ESOPHAGOGASTRODUODENOSCOPY (EGD) WITH PROPOFOL;  Surgeon: Midge Minium, MD;  Location: [facility] ENDOSCOPY;  Service: Endoscopy;  Laterality: N/A;   ESOPHAGOGASTRODUODENOSCOPY (EGD) WITH PROPOFOL N/A 03/01/2023   Procedure: ESOPHAGOGASTRODUODENOSCOPY (EGD) WITH PROPOFOL;  Surgeon: Jaynie Collins, DO;  Location: [facility] ENDOSCOPY;  Service: Gastroenterology;  Laterality: N/A;   ESOPHAGOGASTRODUODENOSCOPY (EGD) WITH PROPOFOL N/A 04/08/2023   Procedure: ESOPHAGOGASTRODUODENOSCOPY (EGD) WITH PROPOFOL;  Surgeon: Tressia Danas,  MD;  Location: [facility] ENDOSCOPY;  Service: Gastroenterology;  Laterality: N/A;   EVALUATION UNDER ANESTHESIA WITH HEMORRHOIDECTOMY N/A 10/31/2022   Procedure: EXAM UNDER ANESTHESIA WITH SUTURE LIGATION OF TWO BLEEDING PEDICLES;  Surgeon: Henrene Dodge, MD;  Location: [facility] ORS;  Service: General;  Laterality: N/A;    FLEXIBLE SIGMOIDOSCOPY N/A 10/02/2022   Procedure: FLEXIBLE SIGMOIDOSCOPY;  Surgeon: Meryl Dare, MD;  Location: [facility] ENDOSCOPY;  Service: Gastroenterology;  Laterality: N/A;   IR TIPS  04/11/2023   IR US GUIDE VASC ACCESS RIGHT  04/11/2023   IR US GUIDE VASC ACCESS RIGHT  04/11/2023   IR US GUIDE VASC ACCESS RIGHT  04/11/2023   LEFT HEART CATH AND CORONARY ANGIOGRAPHY N/A 11/04/2022   Procedure: LEFT HEART CATH AND CORONARY ANGIOGRAPHY;  Surgeon: Iran Ouch, MD;  Location: [facility] INVASIVE CV LAB;  Service: Cardiovascular;  Laterality: N/A;   RADIOLOGY WITH ANESTHESIA N/A 04/11/2023   Procedure: IR WITH ANESTHESIA;  Surgeon: Oley Balm, MD;  Location: [facility] OR;  Service: Radiology;  Laterality: N/A;   VISCERAL ANGIOGRAPHY N/A 10/24/2022   Procedure: VISCERAL ANGIOGRAPHY;  Surgeon: Annice Needy, MD;  Location: [facility] INVASIVE CV LAB;  Service: Cardiovascular;  Laterality: N/A;    Family History  Problem Relation Age of Onset   Coronary artery disease Mother        alive   Other Other        2 sisters alive and well   Alcohol abuse Father        died @ 28   Other Paternal Grandmother        PACER    Social History Social History   Tobacco Use   Smoking status: Every Day    Packs/day: 0.50    Years: 41.00    Additional pack years: 0.00    Total pack years: 20.50    Types: Cigarettes   Smokeless tobacco: Never   Tobacco comments:    started age 80  Vaping Use   Vaping Use: Never used  Substance Use Topics   Alcohol use: Yes    Alcohol/week: 12.0 standard drinks of alcohol    Types: 12 Cans of beer per week    Comment: at least a 6 pack of beer daily and often a 12 pack   Drug use: Yes    Types: Cocaine, Marijuana    Allergies  Allergen Reactions   Penicillins Hives and Rash    Tolerated Ceftriaxone 10/2022    Current Facility-Administered Medications  Medication Dose Route Frequency Provider Last Rate Last Admin   0.9 %  sodium chloride infusion  250 mL  Intravenous PRN Schnier, Latina Craver, MD 10 mL/hr at 05/10/23 0800 Infusion Verify at 05/10/23 0800   acetaminophen (TYLENOL) tablet 650 mg  650 mg Oral Q6H PRN Schnier, Latina Craver, MD   650 mg at 05/08/23 2041   ceFEPIme (MAXIPIME) 2 g in sodium chloride 0.9 % 100 mL IVPB  2 g Intravenous Q8H Wouk, Wilfred Curtis, MD       Chlorhexidine Gluconate Cloth 2 % PADS 6 each  6 each Topical Daily Wouk, Wilfred Curtis, MD       dextrose 5 % and 0.45 % NaCl with KCl 20 mEq/L infusion   Intravenous Continuous Wouk, Wilfred Curtis, MD       diphenhydrAMINE (BENADRYL) injection 12.5 mg  12.5 mg Intravenous Q8H PRN Schnier, Latina Craver, MD   12.5 mg at 05/09/23 1242   folic acid (FOLVITE) tablet 1 mg  1 mg Oral  Daily Schnier, Latina Craver, MD   1 mg at 05/10/23 3220   gabapentin (NEURONTIN) capsule 100 mg  100 mg Oral BID Schnier, Latina Craver, MD   100 mg at 05/10/23 0908   hydrocortisone (ANUSOL-HC) suppository 25 mg  25 mg Rectal BID PRN Schnier, Latina Craver, MD       hydrocortisone (CORTEF) tablet 10 mg  10 mg Oral Daily Schnier, Latina Craver, MD   10 mg at 05/08/23 1130   lactulose (CHRONULAC) 10 GM/15ML solution 30 g  30 g Oral TID Renford Dills, MD   30 g at 05/10/23 0915   lactulose (CHRONULAC) enema 200 gm  300 mL Rectal Once Wouk, Wilfred Curtis, MD       lidocaine (LIDODERM) 5 % 1 patch  1 patch Transdermal Q24H Schnier, Latina Craver, MD   1 patch at 05/10/23 0342   magnesium sulfate IVPB 4 g 100 mL  4 g Intravenous Once Manuela Schwartz, NP 50 mL/hr at 05/10/23 0805 4 g at 05/10/23 0805   metroNIDAZOLE (FLAGYL) IVPB 500 mg  500 mg Intravenous Q12H Manuela Schwartz, NP   Stopped at 05/09/23 2220   midodrine (PROAMATINE) tablet 10 mg  10 mg Oral TID WC Schnier, Latina Craver, MD   10 mg at 05/10/23 0908   morphine (PF) 4 MG/ML injection 2 mg  2 mg Intravenous Q1H PRN Schnier, Latina Craver, MD   2 mg at 05/10/23 0343   multivitamin with minerals tablet 1 tablet  1 tablet Oral Daily Schnier, Latina Craver, MD   1 tablet at 05/10/23  0908   nicotine (NICODERM CQ - dosed in mg/24 hours) patch 21 mg  21 mg Transdermal Daily Schnier, Latina Craver, MD   21 mg at 05/10/23 0910   ondansetron (ZOFRAN) injection 4 mg  4 mg Intravenous Q6H PRN Wouk, Wilfred Curtis, MD       oxyCODONE (Oxy IR/ROXICODONE) immediate release tablet 5-10 mg  5-10 mg Oral Q4H PRN Schnier, Latina Craver, MD       pantoprazole (PROTONIX) EC tablet 40 mg  40 mg Oral Daily Schnier, Latina Craver, MD   40 mg at 05/10/23 2542   potassium chloride SA (KLOR-CON M) CR tablet 40 mEq  40 mEq Oral Once Manuela Schwartz, NP       QUEtiapine (SEROQUEL) tablet 50 mg  50 mg Oral QHS Schnier, Latina Craver, MD   50 mg at 05/09/23 2155   rifaximin (XIFAXAN) tablet 550 mg  550 mg Oral BID Renford Dills, MD   550 mg at 05/10/23 7062   rosuvastatin (CRESTOR) tablet 40 mg  40 mg Oral QHS Schnier, Latina Craver, MD   40 mg at 05/09/23 2153   sodium chloride flush (NS) 0.9 % injection 3 mL  3 mL Intravenous Q12H Schnier, Latina Craver, MD   3 mL at 05/10/23 3762   sodium chloride flush (NS) 0.9 % injection 3 mL  3 mL Intravenous PRN Schnier, Latina Craver, MD       thiamine (VITAMIN B1) tablet 100 mg  100 mg Oral Daily Schnier, Latina Craver, MD   100 mg at 05/10/23 0908   Or   thiamine (VITAMIN B1) injection 100 mg  100 mg Intravenous Daily Schnier, Latina Craver, MD         Review of Systems Full ROS  was asked and was negative except for the information on the HPI  Physical Exam Blood pressure (!) 104/52, pulse 85, temperature 97.9 F (36.6 C), temperature source Oral, resp.  rate 12, height 5\' 5"  (1.651 m), weight 82.8 kg, SpO2 98 %. CONSTITUTIONAL: .Chronically ill EYES: Pupils are equal, round,  Sclera are non-icteric. EARS, NOSE, MOUTH AND THROAT:The oral mucosa is pink and moist. Hearing is intact to voice. LYMPH NODES:  Lymph nodes in the neck are normal. RESPIRATORY:  Lungs are clear. There is normal respiratory effort, with equal breath sounds bilaterally, and without pathologic use of accessory  muscles. CARDIOVASCULAR: Heart is regular without murmurs, gallops, or rubs. GI: The abdomen is  soft, mild tenderness to palpation in the right upper quadrant without peritonitis.  No Definitive Murphy sign, and nondistended. There are no palpable masses. There is no hepatosplenomegaly. There are normal  BS GU: Rectal deferred.   MUSCULOSKELETAL: Normal muscle strength and tone. No cyanosis or edema.   SKIN: Turgor is good and there are no pathologic skin lesions or ulcers. NEUROLOGIC: Motor and sensation is grossly normal. Cranial nerves are grossly intact. PSYCH:  Oriented to person, place and time. Affect is normal.  Data Reviewed  I have personally reviewed the patient's imaging, laboratory findings and medical records.    Assessment/Plan 52 year old male with significant chronic medical issues including Childs C cirrhosis with portal hypertension, coronary artery disease, mesenteric vascular disease status post stent, polysubstance abuse CHF now presents with chronic cholecystitis.  At this point he is is at prohibitive surgical candidate for cholecystectomy.  Discussed with the patient and also primary team options of antibiotics and antibiotics plus cholecystostomy tube.  IR will decide whether cholecystostomy tube is warranted given that even a cholecystostomy tube has significant very delicate patient. Will be happy to continue to follow.  Please note that I spent greater than 75 minutes in this encounter including personally reviewing imaging studies, coordinating his care and performing appropriate documentation  Sterling Big, MD FACS General Surgeon 05/10/2023, 9:16 AM

## 2023-05-10 NOTE — Progress Notes (Signed)
Subjective  - POD # 1, status post SMA and celiac stent  Patient complained of abdominal and back pain throughout the day yesterday which led to a CT scan that did not show any acute complications from his procedure however it was suggestive of acute cholecystitis which was confirmed with right upper quadrant ultrasound   Physical Exam:  Tender at right groin cannulation site without evidence of hematoma Abdominal distention and tenderness       Assessment/Plan:  POD #1  Acute mesenteric ischemia: The patient's blood flow has been optimized with mesenteric stenting yesterday.  He continues to have abdominal pain which I feel is explained by his acute cholecystitis.  General surgery has been consulted.  He is on Plavix for his stents which could be discontinued if cholecystectomy is considered  Ethan Ethan 05/10/2023 2:28 PM --  Vitals:   05/10/23 0900 05/10/23 0945  BP: (!) 96/52 (!) 103/56  Pulse: 89 88  Resp: 17 17  Temp:    SpO2: 96% 96%    Intake/Output Summary (Last 24 hours) at 05/10/2023 1428 Last data filed at 05/10/2023 1040 Gross per 24 hour  Intake 1399.23 ml  Output 950 ml  Net 449.23 ml     Laboratory CBC    Component Value Date/Time   WBC 7.0 05/10/2023 0559   HGB 9.6 (L) 05/10/2023 0559   HGB 15.0 09/06/2013 1458   HCT 29.6 (L) 05/10/2023 0559   HCT 42.2 09/06/2013 1458   PLT 102 (L) 05/10/2023 0559   PLT 270 09/06/2013 1458    BMET    Component Value Date/Time   NA 136 05/10/2023 0559   NA 134 (L) 09/06/2013 1458   K 3.3 (L) 05/10/2023 0559   K 3.5 09/06/2013 1458   CL 112 (H) 05/10/2023 0559   CL 102 09/06/2013 1458   CO2 17 (L) 05/10/2023 0559   CO2 23 09/06/2013 1458   GLUCOSE 93 05/10/2023 0559   GLUCOSE 167 (H) 09/06/2013 1458   BUN 11 05/10/2023 0559   BUN 4 (L) 09/06/2013 1458   CREATININE 0.66 05/10/2023 0559   CREATININE 0.49 (L) 09/23/2022 1034   CALCIUM 8.4 (L) 05/10/2023 0559   CALCIUM 8.9 09/06/2013 1458    GFRNONAA >60 05/10/2023 0559   GFRNONAA >60 09/06/2013 1458   GFRAA >60 06/05/2020 1109   GFRAA >60 09/06/2013 1458    COAG Lab Results  Component Value Date   INR 1.6 (H) 05/10/2023   INR 1.5 (H) 05/09/2023   INR 1.5 (H) 05/05/2023   No results found for: "PTT"  Antibiotics Anti-infectives (From admission, onward)    Start     Dose/Rate Route Frequency Ordered Stop   05/10/23 1400  ceFEPIme (MAXIPIME) 2 g in sodium chloride 0.9 % 100 mL IVPB        2 g 200 mL/hr over 30 Minutes Intravenous Every 8 hours 05/10/23 0810     05/10/23 1000  vancomycin (VANCOREADY) IVPB 750 mg/150 mL  Status:  Discontinued        750 mg 150 mL/hr over 60 Minutes Intravenous Every 12 hours 05/09/23 2100 05/10/23 0809   05/09/23 2200  aztreonam (AZACTAM) 1 g in sodium chloride 0.9 % 100 mL IVPB  Status:  Discontinued        1 g 200 mL/hr over 30 Minutes Intravenous Every 8 hours 05/09/23 2004 05/10/23 0808   05/09/23 2200  vancomycin (VANCOREADY) IVPB 1750 mg/350 mL        1,750 mg 175 mL/hr  over 120 Minutes Intravenous  Once 05/09/23 2100 05/10/23 0059   05/09/23 2100  metroNIDAZOLE (FLAGYL) IVPB 500 mg        500 mg 100 mL/hr over 60 Minutes Intravenous Every 12 hours 05/09/23 2004     05/09/23 0732  vancomycin (VANCOCIN) IVPB 1000 mg/200 mL premix        1,000 mg 200 mL/hr over 60 Minutes Intravenous 60 min pre-op 05/09/23 0732 05/09/23 0952   05/05/23 2200  rifaximin (XIFAXAN) tablet 550 mg        550 mg Oral 2 times daily 05/05/23 1632     05/05/23 0800  cefTRIAXone (ROCEPHIN) 1 g in sodium chloride 0.9 % 100 mL IVPB  Status:  Discontinued        1 g 200 mL/hr over 30 Minutes Intravenous Every 24 hours 05/05/23 0758 05/07/23 1514        V. Charlena Cross, M.D., [facility] [place] Office: [phone] Pager:  [phone]

## 2023-05-11 DIAGNOSIS — K811 Chronic cholecystitis: Secondary | ICD-10-CM | POA: Diagnosis not present

## 2023-05-11 DIAGNOSIS — R1084 Generalized abdominal pain: Secondary | ICD-10-CM | POA: Diagnosis not present

## 2023-05-11 LAB — COMPREHENSIVE METABOLIC PANEL
ALT: 27 U/L (ref 0–44)
AST: 61 U/L — ABNORMAL HIGH (ref 15–41)
Albumin: 2.6 g/dL — ABNORMAL LOW (ref 3.5–5.0)
Alkaline Phosphatase: 200 U/L — ABNORMAL HIGH (ref 38–126)
Anion gap: 6 (ref 5–15)
BUN: 11 mg/dL (ref 6–20)
CO2: 18 mmol/L — ABNORMAL LOW (ref 22–32)
Calcium: 8.1 mg/dL — ABNORMAL LOW (ref 8.9–10.3)
Chloride: 111 mmol/L (ref 98–111)
Creatinine, Ser: 0.57 mg/dL — ABNORMAL LOW (ref 0.61–1.24)
GFR, Estimated: >60 mL/min (ref >60)
Glucose, Bld: 88 mg/dL (ref 70–99)
Potassium: 3.4 mmol/L — ABNORMAL LOW (ref 3.5–5.1)
Sodium: 135 mmol/L (ref 135–145)
Total Bilirubin: 3.2 mg/dL — ABNORMAL HIGH (ref 0.3–1.2)
Total Protein: 6.7 g/dL (ref 6.5–8.1)

## 2023-05-11 LAB — GLUCOSE, CAPILLARY
Glucose-Capillary: 85 mg/dL (ref 70–99)
Glucose-Capillary: 80 mg/dL (ref 70–99)
Glucose-Capillary: 89 mg/dL (ref 70–99)
Glucose-Capillary: 178 mg/dL — ABNORMAL HIGH (ref 70–99)
Glucose-Capillary: 132 mg/dL — ABNORMAL HIGH (ref 70–99)

## 2023-05-11 LAB — CULTURE, BLOOD (ROUTINE X 2): Culture: NO GROWTH

## 2023-05-11 MED ORDER — MORPHINE SULFATE (PF) 2 MG/ML IV SOLN: 2.0000 mg | INTRAVENOUS | Status: DC | PRN

## 2023-05-11 MED ORDER — HYDROCORTISONE 5 MG PO TABS
5.0000 mg | ORAL_TABLET | Freq: Every day | ORAL | Status: DC
  Administered 2023-05-11 – 2023-05-13 (×3): 5 mg via ORAL
  Filled 2023-05-11 (×4): qty 1

## 2023-05-11 MED ORDER — SODIUM CHLORIDE 0.9% FLUSH: 10.0000 mL | INTRAVENOUS | Status: DC | PRN

## 2023-05-11 MED ORDER — SODIUM CHLORIDE 0.9% FLUSH
10.0000 mL | Freq: Two times a day (BID) | INTRAVENOUS | Status: DC
  Administered 2023-05-11 – 2023-05-13 (×4): 10 mL

## 2023-05-11 MED ORDER — MORPHINE SULFATE (PF) 2 MG/ML IV SOLN: 1.0000 mg | Freq: Four times a day (QID) | INTRAVENOUS | Status: DC | PRN

## 2023-05-11 NOTE — TOC Initial Note (Signed)
Transition of Care [facility]) - Initial/Assessment Note    Patient Details  Name: Ethan Wells MRN: [id number] Date of Birth: [birth year]  [facility] CM/SW Contact:    Liliana Cline, LCSW Phone Number: 05/11/2023, 9:48 AM  Clinical Narrative:                 CSW spoke to patient regarding home health recommendations. Patient states he is living in a boarding home and is not interested in home health or OPPT. Patient states when he gets out of the hospital, he plans to find somewhere to move to as he has bed bugs and does not like where he is living now. Patient was encouraged to reach out to his PCP if he decides he does want HH or OPPT after discharge, he verbalized understanding. Patient states he drives himself to appointments. Patient has a rollator and rolling walker. Patient denies TOC needs or resource needs.   Expected Discharge Plan: Home/Self Care Barriers to Discharge: Continued Medical Work up   Patient Goals and CMS Choice   CMS Medicare.gov Compare Post Acute Care list provided to:: Patient Choice offered to / list presented to : Patient      Expected Discharge Plan and Services       Living arrangements for the past 2 months: Boarding House                                      Prior Living Arrangements/Services Living arrangements for the past 2 months: [employer] Lives with:: Self Patient language and need for interpreter reviewed:: Yes Do you feel safe going back to the place where you live?: Yes      Need for Family Participation in Patient Care: Yes (Comment) Care giver support system in place?: No (comment) Current home services: DME Criminal Activity/Legal Involvement Pertinent to Current Situation/Hospitalization: No - Comment as needed  Activities of Daily Living Home Assistive Devices/Equipment: None ADL Screening (condition at time of admission) Patient's cognitive ability adequate to safely complete daily  activities?: Yes Is the patient deaf or have difficulty hearing?: No Does the patient have difficulty seeing, even when wearing glasses/contacts?: No Does the patient have difficulty concentrating, remembering, or making decisions?: No Patient able to express need for assistance with ADLs?: Yes Does the patient have difficulty dressing or bathing?: No Independently performs ADLs?: Yes (appropriate for developmental age) Does the patient have difficulty walking or climbing stairs?: Yes Weakness of Legs: None Weakness of Arms/Hands: None  Permission Sought/Granted                  Emotional Assessment       Orientation: : Oriented to  Time, Oriented to Place, Oriented to Self, Oriented to Situation   Psych Involvement: No (comment)  Admission diagnosis:  Hypokalemia [E87.6] GI bleeding [K92.2] Rectal bleeding [K62.5] Generalized abdominal pain [R10.84] Chest pain, unspecified type [R07.9] Patient Active Problem List   Diagnosis Date Noted   Cholecystitis, acute 05/10/2023   UTI (urinary tract infection) 05/05/2023   Chronic diastolic CHF (congestive heart failure) (HCC) 05/05/2023   Alcohol abuse 05/05/2023   Alcoholic cirrhosis (HCC) 04/12/2023   Esophageal varices with bleeding in diseases classified elsewhere (HCC) 04/09/2023   Hepatic encephalopathy (HCC) 04/09/2023   Secondary esophageal varices with bleeding (HCC) 04/08/2023   Upper GI bleeding 04/07/2023   Leg edema 03/01/2023   GI bleeding 02/22/2023  HLD (hyperlipidemia) 02/22/2023   Thrombocytopenia (HCC) 02/22/2023   Adrenal insufficiency (HCC) 02/22/2023   Alcoholic liver disease (HCC) 02/22/2023   Upper GI bleed 02/22/2023   Myocardial injury 01/29/2023   Adrenal insufficiency (Addison's disease) (HCC) 01/28/2023   Reactive thrombocytosis 01/24/2023   Hypophosphatemia 01/22/2023   Acute hypoxic respiratory failure (HCC) 01/20/2023   Orthostatic hypotension 01/17/2023   Anasarca 01/17/2023   Obesity  (BMI 30-39.9) 01/17/2023   Cocaine dependence with cocaine-induced disorder (HCC) 01/17/2023   Rectal bleeding 01/17/2023   Peripheral edema 01/17/2023   History of benzodiazepine use 01/17/2023   Chest pain 11/19/2022   Acute on chronic heart failure with preserved ejection fraction (HFpEF) (HCC) 10/29/2022   Acute respiratory distress 10/29/2022   Elevated rheumatoid factor 10/28/2022   Hyperkalemia 10/25/2022   Hemorrhoids, internal, with bleeding 10/25/2022   Mesenteric artery stenosis (HCC) 10/23/2022   Iron deficiency anemia 10/23/2022   Hematochezia    Abnormal CT scan, colon    Colitis 09/30/2022   Prolonged QT interval 09/30/2022   Alcohol use disorder 08/19/2022   GI bleed 05/28/2022   Non-intractable vomiting    Epigastric pain    Polyp of ascending colon    Anemia due to GI blood loss 08/07/2021   Acute gastritis    Hyperbilirubinemia    Elevated troponin    Notalgia    History of ST elevation myocardial infarction (STEMI) 12/19/2020   Alcohol dependence (HCC) 12/19/2020   Depression with anxiety 10/05/2020   Esophageal reflux 10/05/2020   Headache 10/05/2020   Hyperlipidemia 10/05/2020   Hyponatremia 06/04/2020   Hypomagnesemia 06/04/2020   Chronic systolic CHF (congestive heart failure) (HCC) 06/04/2020   Leukocytosis 06/04/2020   Abdominal pain 06/04/2020   Hypokalemia 06/04/2020   Ischemic cardiomyopathy    Polysubstance abuse (HCC)    NSTEMI (non-ST elevated myocardial infarction) (HCC) 07/12/2019   Abdominal pain, RUQ 05/25/2018   Chest pain, mid sternal 11/17/2012   Cocaine abuse (HCC)    Marijuana abuse    Tobacco abuse 02/26/2011   Essential hypertension 02/26/2011   CAD (coronary artery disease) 02/26/2011   Other specified forms of chronic ischemic heart disease 02/26/2011   PCP:  [name], NP Pharmacy:   [facility] [address] [place] - [address] [place] [address] Phone: [phone] Fax:  [phone]  [facility] [address] [place] [address] Phone: [phone] Fax: [phone]  [id number] [place], [place] - [address] [place] [address] Phone: [phone] Fax: [phone]     Social Determinants of Health (SDOH) Social History: SDOH Screenings   Food Insecurity: No Food Insecurity (04/27/2023)  Housing: Patient Unable To Answer (04/27/2023)  Transportation Needs: No Transportation Needs (04/27/2023)  Utilities: Not At Risk (04/27/2023)  Alcohol Screen: Medium Risk (10/01/2022)  Tobacco Use: High Risk (05/09/2023)   SDOH Interventions:     Readmission Risk Interventions    02/24/2023    3:12 PM 01/17/2023    3:38 PM 10/22/2022   10:47 AM  Readmission Risk Prevention Plan  Transportation Screening Complete Complete Complete  PCP or Specialist Appt within 3-5 Days   Complete  Social Work Consult for Recovery Care Planning/Counseling   Complete  Palliative Care Screening   Not Applicable  Medication Review Oceanographer) Complete Complete Complete  SW Recovery Care/Counseling Consult  Not Complete   SW Consult Not Complete Comments  lethargic   Palliative Care Screening Not Applicable Not  Applicable   Skilled Nursing Facility Not Applicable Not Applicable

## 2023-05-11 NOTE — Progress Notes (Signed)
Patient arrived stable to unit. 2 person skin check completed with erica, rn and self. Patient resting in bed. Bed alarm on.    05/11/23 1243  Vitals  Temp 97.7 F (36.5 C)  BP 116/73  MAP (mmHg) 86  BP Location Left Arm  BP Method Automatic  Patient Position (if appropriate) Lying  Pulse Rate 93  Pulse Rate Source Monitor  Resp 16  MEWS COLOR  MEWS Score Color Green  Oxygen Therapy  SpO2 100 %  O2 Device Room Air  MEWS Score  MEWS Temp 0  MEWS Systolic 0  MEWS Pulse 0  MEWS RR 0  MEWS LOC 0  MEWS Score 0

## 2023-05-11 NOTE — Progress Notes (Signed)
Order given by Dr Allena Katz to change CBG's from [facility] to [facility]

## 2023-05-11 NOTE — Progress Notes (Signed)
CC: ? cholecysitits Subjective: Doing better, pain every where Tolerating liquids   Objective: Vital signs in last 24 hours: Temp:  [97.8 F (36.6 C)-98 F (36.7 C)] 98 F (36.7 C) (05/26 0803) Pulse Rate:  [78-84] 84 (05/26 0803) Resp:  [16-18] 18 (05/26 0803) BP: (97-102)/(51-57) 97/57 (05/26 0803) SpO2:  [98 %-99 %] 98 % (05/26 0803) Weight:  [83.1 kg] 83.1 kg (05/26 0500) Last BM Date : 05/10/23  Intake/Output from previous day: 05/25 0701 - 05/26 0700 In: 792.8 [P.O.:300; I.V.:188.2; IV Piggyback:304.6] Out: 950 [Urine:950] Intake/Output this shift: Total I/O In: 720 [P.O.:720] Out: 300 [Urine:300]  Physical exam:  NAD alert Abd" soft, no peritonitis, some mild tenderness on RUQ  Lab Results: CBC  Recent Labs    05/09/23 1921 05/10/23 0559  WBC 10.0 7.0  HGB 10.7* 9.6*  HCT 32.5* 29.6*  PLT 110* 102*   BMET Recent Labs    05/10/23 0559 05/11/23 0419  NA 136 135  K 3.3* 3.4*  CL 112* 111  CO2 17* 18*  GLUCOSE 93 88  BUN 11 11  CREATININE 0.66 0.57*  CALCIUM 8.4* 8.1*   PT/INR Recent Labs    05/09/23 0505 05/10/23 0559  LABPROT 18.2* 18.9*  INR 1.5* 1.6*   ABG Recent Labs    05/09/23 1931 05/10/23 0657  HCO3 17.8* 17.0*    Studies/Results: [place] EKG SITE RITE  Result Date: 05/10/2023 If Site Rite image not attached, placement could not be confirmed due to current cardiac rhythm.  US Abdomen Limited RUQ (LIVER/GB)  Result Date: 05/09/2023 CLINICAL DATA:  [id number] Cholecystitis [id number] EXAM: ULTRASOUND ABDOMEN LIMITED RIGHT UPPER QUADRANT COMPARISON:  None Available.  CT abdomen pelvis 05/09/2023 FINDINGS: Gallbladder: Marked gallbladder wall thickening (measuring up to 9 mm) and hyperemia. Gallbladder sludge and gallstones noted within the gallbladder lumen. Question trace pericholecystic fluid. Positive sonographic Murphy sign noted by sonographer. Common bile duct: Diameter: 5 mm. Liver: Nodular hepatic contour. No focal lesion identified.  Patent tips. Within normal limits in parenchymal echogenicity. Portal vein is patent on color Doppler imaging with normal direction of blood flow towards the liver. Other: None. IMPRESSION: 1. Cholelithiasis and gallbladder sludge with associated acute cholecystitis. 2. Cirrhosis with patent tips. No focal liver lesions identified. Please note that liver protocol enhanced MR and CT are the most sensitive tests for the screening detection of hepatocellular carcinoma in the high risk setting of cirrhosis. Electronically Signed   By: Tish Frederickson M.D.   On: 05/09/2023 19:43   CT CHEST ABDOMEN PELVIS WO CONTRAST  Result Date: 05/09/2023 CLINICAL DATA:  Abdominal pain. Status post revascularization of SMA and celiac artery with stenting. EXAM: CT CHEST, ABDOMEN AND PELVIS WITHOUT CONTRAST TECHNIQUE: Multidetector CT imaging of the chest, abdomen and pelvis was performed following the standard protocol without IV contrast. RADIATION DOSE REDUCTION: This exam was performed according to the departmental dose-optimization program which includes automated exposure control, adjustment of the mA and/or kV according to patient size and/or use of iterative reconstruction technique. COMPARISON:  05/05/2023 FINDINGS: CT CHEST FINDINGS Cardiovascular: Heart size appears normal. Aortic atherosclerosis and coronary artery calcifications. No pericardial effusion identified. Mediastinum/Nodes: Thyroid gland, trachea, and esophagus are unremarkable. No enlarged mediastinal or axillary lymph nodes. Lungs/Pleura: Mild thickening along the posterior pleura overlying the lower lobes. No significant pleural effusion, airspace consolidation or pneumothorax. Mild to moderate emphysema. Scarring identified within the lingula and posterior lung bases. Bilateral peripheral areas of interstitial reticulation and subpleural banding noted which likely reflects chronic postinflammatory change.  Stable subpleural nodule within the anterior right  upper lobe measuring 4 mm, image 56/5. Subpleural nodule within the anterior right middle lobe is also unchanged measuring 5 mm. Musculoskeletal: Subacute to chronic right 6 anterior rib fracture, image 96/5. Possible acute fracture involving the anterior aspect of the right fifth rib, image 83/5. Remote left anterior third and fourth fractures identified. CT ABDOMEN PELVIS FINDINGS Hepatobiliary: Morphologic features of cirrhosis identified within the liver. Status post tips. No focal liver lesion identified. There is diffuse gallbladder wall thickening with surrounding inflammation. Gallbladder wall thickness measures up to 8 mm. No calcified gallstones identified. No signs of bile duct dilatation. Pancreas: Unremarkable. No pancreatic ductal dilatation or surrounding inflammatory changes. Spleen: Spleen measures 14.9 cm in cranial caudal dimension. No focal abnormality. Adrenals/Urinary Tract: Normal adrenal glands. Contrast material noted within the collecting system of both kidneys. No hydronephrosis or mass. Urinary bladder is filled with contrast material from procedure performed earlier today. No focal bladder abnormality. Stomach/Bowel: Stomach appears within normal limits. Small bowel loops are mildly dilated measuring up to 3.1 cm. No abrupt caliber change. Gradual decrease caliber of the small bowel loops noted distally. There is a moderate stool burden identified within the colon concerning for constipation. The appendix is not confidently identified. Vascular/Lymphatic: Aortic atherosclerosis. Stents identified within the celiac and SMA. No abdominopelvic adenopathy. Reproductive: Prostate is unremarkable. Other: No significant free fluid or fluid collections. Postprocedural change identified within the right groin region. Musculoskeletal: No acute or significant osseous findings. IMPRESSION: 1. Diffuse gallbladder wall thickening and inflammation. Findings are concerning for acute cholecystitis.  Consider further evaluation with right upper quadrant ultrasound. 2. Mild dilatation of small bowel loops without abrupt caliber change. Gradual decrease caliber of the small bowel loops noted distally. Findings are favored to represent ileus. 3. Moderate stool burden identified within the colon concerning for constipation. 4. Subacute to chronic right 6th anterior rib fracture. Possible acute fracture involving the anterior aspect of the right fifth rib. 5. Cirrhosis with splenomegaly. Status post TIPS. 6.  Aortic Atherosclerosis (ICD10-I70.0). Electronically Signed   By: Signa Kell M.D.   On: 05/09/2023 18:44   DG Chest Port 1 View  Result Date: 05/09/2023 CLINICAL DATA:  Chest pain. EXAM: PORTABLE CHEST 1 VIEW COMPARISON:  May 04, 2023. FINDINGS: Mild cardiomegaly. Both lungs are clear. The visualized skeletal structures are unremarkable. IMPRESSION: No active disease. Electronically Signed   By: Lupita Raider M.D.   On: 05/09/2023 16:08    Anti-infectives: Anti-infectives (From admission, onward)    Start     Dose/Rate Route Frequency Ordered Stop   05/10/23 1400  ceFEPIme (MAXIPIME) 2 g in sodium chloride 0.9 % 100 mL IVPB        2 g 200 mL/hr over 30 Minutes Intravenous Every 8 hours 05/10/23 0810     05/10/23 1000  vancomycin (VANCOREADY) IVPB 750 mg/150 mL  Status:  Discontinued        750 mg 150 mL/hr over 60 Minutes Intravenous Every 12 hours 05/09/23 2100 05/10/23 0809   05/09/23 2200  aztreonam (AZACTAM) 1 g in sodium chloride 0.9 % 100 mL IVPB  Status:  Discontinued        1 g 200 mL/hr over 30 Minutes Intravenous Every 8 hours 05/09/23 2004 05/10/23 0808   05/09/23 2200  vancomycin (VANCOREADY) IVPB 1750 mg/350 mL        1,750 mg 175 mL/hr over 120 Minutes Intravenous  Once 05/09/23 2100 05/10/23 0059   05/09/23 2100  metroNIDAZOLE (FLAGYL) IVPB 500 mg        500 mg 100 mL/hr over 60 Minutes Intravenous Every 12 hours 05/09/23 2004     05/09/23 0732  vancomycin (VANCOCIN)  IVPB 1000 mg/200 mL premix        1,000 mg 200 mL/hr over 60 Minutes Intravenous 60 min pre-op 05/09/23 0732 05/09/23 0952   05/05/23 2200  rifaximin (XIFAXAN) tablet 550 mg        550 mg Oral 2 times daily 05/05/23 1632     05/05/23 0800  cefTRIAXone (ROCEPHIN) 1 g in sodium chloride 0.9 % 100 mL IVPB  Status:  Discontinued        1 g 200 mL/hr over 30 Minutes Intravenous Every 24 hours 05/05/23 0758 05/07/23 1514       Assessment/Plan: Chronic cholecystitis responding to medical rx, no need for surgical intervention IR feels chole tube too risky as well We will be available    Sterling Big, MD, FACS  05/11/2023

## 2023-05-11 NOTE — Progress Notes (Signed)
[facility]  - [place] [facility]   PATIENT NAME: Ethan Wells    MR#:  [id number]  DATE OF BIRTH:  October 22, 1971  SUBJECTIVE:  patient tells me he's feeling hungry. Agreeable for soft diet. No family at bedside.    VITALS:  Blood pressure 116/73, pulse 93, temperature 97.7 F (36.5 C), resp. rate 16, height 5\' 5"  (1.651 m), weight 83.1 kg, SpO2 100 %.  PHYSICAL EXAMINATION:   GENERAL:  52 y.o.-year-old patient with no acute distress. Disheveled LUNGS: Normal breath sounds bilaterally, no wheezing CARDIOVASCULAR: S1, S2 normal. No murmur   ABDOMEN: Soft, nontender, nondistended. Bowel sounds present.  EXTREMITIES: No  edema b/l.    NEUROLOGIC: nonfocal  patient is alert and awake SKIN: No obvious rash, lesion, or ulcer.   LABORATORY PANEL:  CBC Recent Labs  Lab 05/10/23 0559  WBC 7.0  HGB 9.6*  HCT 29.6*  PLT 102*    Chemistries  Recent Labs  Lab 05/10/23 0559 05/11/23 0419  NA 136 135  K 3.3* 3.4*  CL 112* 111  CO2 17* 18*  GLUCOSE 93 88  BUN 11 11  CREATININE 0.66 0.57*  CALCIUM 8.4* 8.1*  MG 1.5*  --   AST 79* 61*  ALT 32 27  ALKPHOS 200* 200*  BILITOT 3.5* 3.2*   Cardiac Enzymes No results for input(s): "TROPONINI" in the last 168 hours. RADIOLOGY:  [place] EKG SITE RITE  Result Date: 05/10/2023 If Site Rite image not attached, placement could not be confirmed due to current cardiac rhythm.  US Abdomen Limited RUQ (LIVER/GB)  Result Date: 05/09/2023 CLINICAL DATA:  92631 Cholecystitis [id number] EXAM: ULTRASOUND ABDOMEN LIMITED RIGHT UPPER QUADRANT COMPARISON:  None Available.  CT abdomen pelvis 05/09/2023 FINDINGS: Gallbladder: Marked gallbladder wall thickening (measuring up to 9 mm) and hyperemia. Gallbladder sludge and gallstones noted within the gallbladder lumen. Question trace pericholecystic fluid. Positive sonographic Murphy sign noted by sonographer. Common bile duct: Diameter: 5 mm. Liver: Nodular hepatic contour. No focal  lesion identified. Patent tips. Within normal limits in parenchymal echogenicity. Portal vein is patent on color Doppler imaging with normal direction of blood flow towards the liver. Other: None. IMPRESSION: 1. Cholelithiasis and gallbladder sludge with associated acute cholecystitis. 2. Cirrhosis with patent tips. No focal liver lesions identified. Please note that liver protocol enhanced MR and CT are the most sensitive tests for the screening detection of hepatocellular carcinoma in the high risk setting of cirrhosis. Electronically Signed   By: Tish Frederickson M.D.   On: 05/09/2023 19:43   CT CHEST ABDOMEN PELVIS WO CONTRAST  Result Date: 05/09/2023 CLINICAL DATA:  Abdominal pain. Status post revascularization of SMA and celiac artery with stenting. EXAM: CT CHEST, ABDOMEN AND PELVIS WITHOUT CONTRAST TECHNIQUE: Multidetector CT imaging of the chest, abdomen and pelvis was performed following the standard protocol without IV contrast. RADIATION DOSE REDUCTION: This exam was performed according to the departmental dose-optimization program which includes automated exposure control, adjustment of the mA and/or kV according to patient size and/or use of iterative reconstruction technique. COMPARISON:  05/05/2023 FINDINGS: CT CHEST FINDINGS Cardiovascular: Heart size appears normal. Aortic atherosclerosis and coronary artery calcifications. No pericardial effusion identified. Mediastinum/Nodes: Thyroid gland, trachea, and esophagus are unremarkable. No enlarged mediastinal or axillary lymph nodes. Lungs/Pleura: Mild thickening along the posterior pleura overlying the lower lobes. No significant pleural effusion, airspace consolidation or pneumothorax. Mild to moderate emphysema. Scarring identified within the lingula and posterior lung bases. Bilateral peripheral areas of interstitial reticulation and subpleural banding  noted which likely reflects chronic postinflammatory change. Stable subpleural nodule within  the anterior right upper lobe measuring 4 mm, image 56/5. Subpleural nodule within the anterior right middle lobe is also unchanged measuring 5 mm. Musculoskeletal: Subacute to chronic right 6 anterior rib fracture, image 96/5. Possible acute fracture involving the anterior aspect of the right fifth rib, image 83/5. Remote left anterior third and fourth fractures identified. CT ABDOMEN PELVIS FINDINGS Hepatobiliary: Morphologic features of cirrhosis identified within the liver. Status post tips. No focal liver lesion identified. There is diffuse gallbladder wall thickening with surrounding inflammation. Gallbladder wall thickness measures up to 8 mm. No calcified gallstones identified. No signs of bile duct dilatation. Pancreas: Unremarkable. No pancreatic ductal dilatation or surrounding inflammatory changes. Spleen: Spleen measures 14.9 cm in cranial caudal dimension. No focal abnormality. Adrenals/Urinary Tract: Normal adrenal glands. Contrast material noted within the collecting system of both kidneys. No hydronephrosis or mass. Urinary bladder is filled with contrast material from procedure performed earlier today. No focal bladder abnormality. Stomach/Bowel: Stomach appears within normal limits. Small bowel loops are mildly dilated measuring up to 3.1 cm. No abrupt caliber change. Gradual decrease caliber of the small bowel loops noted distally. There is a moderate stool burden identified within the colon concerning for constipation. The appendix is not confidently identified. Vascular/Lymphatic: Aortic atherosclerosis. Stents identified within the celiac and SMA. No abdominopelvic adenopathy. Reproductive: Prostate is unremarkable. Other: No significant free fluid or fluid collections. Postprocedural change identified within the right groin region. Musculoskeletal: No acute or significant osseous findings. IMPRESSION: 1. Diffuse gallbladder wall thickening and inflammation. Findings are concerning for acute  cholecystitis. Consider further evaluation with right upper quadrant ultrasound. 2. Mild dilatation of small bowel loops without abrupt caliber change. Gradual decrease caliber of the small bowel loops noted distally. Findings are favored to represent ileus. 3. Moderate stool burden identified within the colon concerning for constipation. 4. Subacute to chronic right 6th anterior rib fracture. Possible acute fracture involving the anterior aspect of the right fifth rib. 5. Cirrhosis with splenomegaly. Status post TIPS. 6.  Aortic Atherosclerosis (ICD10-I70.0). Electronically Signed   By: Signa Kell M.D.   On: 05/09/2023 18:44    Assessment and Plan  52 y.o. male with medical history significant of alcohol abuse, alcoholic liver cirrhosis with portal gastropathy, s/p of TIPS, superior mesenteric artery stenosis, HTN, HLd, CAD, STEMI, dCHF, GERD, depression with anxiety, adrenal insufficiency (Addison's disease), migraine headache, cardiac arrest due to V-fib, recent admission due to GI bleeding, cocaine abuse, tobacco abuse, who presents with chest pain, abdominal pain, rectal bleeding.   Patient was recently hospitalized from 4/22 - 5/13 due to upper GI bleeding. Pt is s/p of EGD with banding on 4/23 and s/p of TIPS procedure on 4/27. Pt states that he continues to use cocaine and drinking alcohol.   ?Acute on chronic cholecystitis --Worsening abdominal pain and two episodes vomiting after vascular procedure on 5/24.  --CT suggestive of acute cholecystitis and ruq u/s confirms this. Started on abx, gen surg consulted. Hemodynamically stable - f/u gen surg and IR consult-- patient is too risky for any procedure. - on empiric flagyl, cefepime--pt clinically does not seem to have infection - f/u blood cultures-- negative -white count normal. No fever. Patient not vomiting. He is hungry asking for food. -- Discussed with surgery and looking at IR notes given clinical picture does not fit into acute  nature of gallbladder disease no intervention/surgical plan. Since patient is hungry will start imminent PO diet --  consider stopping/de-escalating antibiotics if patient tolerates PO diet.   Ileus, partial Likely 2/2 above. No vomiting overnight   GI bleeding and anemia due to GI blood loss Hemorrhoidal bleeding --Hgb stable in the 9s.   --GI evaluated patient, thinks likely hemorrhoidal.  No plans for endoscopic evaluation currently.Marland Kitchen admitted last month for upper gi bleed 2/2 variceal bleed treated with banding and tips.  -Continue PPI for now --Anusol twice daily as needed   Adrenal insufficiency (Addison's disease) (HCC) -continue home Cortef    Low back pain This is his primary compliant. Says it's chronic but worsening and now associated with gait dysfunction. MRI of lumbar spine 5/22 shows small disc protrusions, no stenosis, mild foraminal stenosis. - pt advising home health   Alcoholic cirrhosis (HCC):  Status post TIPS INR 1.5, ammonia 59, mental status poor but likely attributed to narcotics Plan: -Lactulose 20 g 3 times daily, increased to 30 tid, BMs still not appropriate, will give lactulose enema today -Rifaximin -Hold Lasix and spironolactone due to soft blood pressure, restart as appropriate   Constipation Prn bowel meds   Abdominal pain and superior mesenteric artery stenosis -- CT scan also showed some abnormalities in the SMA. -- Vascular performed percutaneous procedure 5/24 with several stents to the SMA. - plavix on hold, will reach out to vascular about this - cont statin   Hypokalemia Hypomagenesmia Repleting today   HLD (hyperlipidemia) -Crestor   Chronic diastolic CHF (congestive heart failure) (HCC)  2D echo on 04/11/2023 showed EF of 55-60% with grade 2 diastolic dysfunction.  Patient mild plus leg edema, elevated BNP 426, but no oxygen desaturation.  Chest x-ray negative for pulmonary edema.  Does not seem to have acute CHF exacerbation, but  patient had a high risk of developing CHF exacerbation.   -Hold Lasix and spironolactone as above   Essential hypertension Blood pressure soft with maps in upper 60s -Hold all blood pressure medications and diuretics due to soft blood pressure -Midodrine 10 mg 3 times daily, wean as tolerated   Depression with anxiety -Continue home medications   Polysubstance abuse Cocaine, alcohol, tobacco -Did counseling about importance of quitting substance use -Nicotine patch -CIWA protocol, no signs withdrawal       DVT prophylaxis: SCDs Code Status: Full Family Communication: None Disposition Plan: Status is: Inpatient Remains inpatient appropriate because: ongoing w/u of abdominal pain Level of care: Med-Surg Status is: Inpatient Remains inpatient appropriate because: abd pai workup. General surgery and IR not planning on any procedure. Will start PO diet if tolerating discharge hopefully in a day or two. Patient's antibiotic needs to be de-escalated as well. Consider resuming Plavix prior to discharge.    TOTAL TIME TAKING CARE OF THIS PATIENT: 35 minutes.  >50% time spent on counselling and coordination of care  Note: This dictation was prepared with Dragon dictation along with smaller phrase technology. Any transcriptional errors that result from this process are unintentional.  Enedina Finner M.D    [facility]   CC: Primary care physician; [name], NP

## 2023-05-11 NOTE — Progress Notes (Signed)
PT Cancellation Note  Patient Details Name: Ethan Wells MRN: [id number] DOB: [birth year]   Cancelled Treatment:    Reason Eval/Treat Not Completed: Other (comment)  Offered and encouraged PT session but he declined.  Will return at a later time/date.   Danielle Dess 05/11/2023, 2:39 PM

## 2023-05-12 DIAGNOSIS — K922 Gastrointestinal hemorrhage, unspecified: Secondary | ICD-10-CM | POA: Diagnosis not present

## 2023-05-12 LAB — GLUCOSE, CAPILLARY
Glucose-Capillary: 114 mg/dL — ABNORMAL HIGH (ref 70–99)
Glucose-Capillary: 128 mg/dL — ABNORMAL HIGH (ref 70–99)
Glucose-Capillary: 126 mg/dL — ABNORMAL HIGH (ref 70–99)
Glucose-Capillary: 148 mg/dL — ABNORMAL HIGH (ref 70–99)

## 2023-05-12 LAB — COMPREHENSIVE METABOLIC PANEL
ALT: 24 U/L (ref 0–44)
AST: 54 U/L — ABNORMAL HIGH (ref 15–41)
Albumin: 2.4 g/dL — ABNORMAL LOW (ref 3.5–5.0)
Alkaline Phosphatase: 204 U/L — ABNORMAL HIGH (ref 38–126)
Anion gap: 5 (ref 5–15)
BUN: 6 mg/dL (ref 6–20)
CO2: 16 mmol/L — ABNORMAL LOW (ref 22–32)
Calcium: 8.3 mg/dL — ABNORMAL LOW (ref 8.9–10.3)
Chloride: 112 mmol/L — ABNORMAL HIGH (ref 98–111)
Creatinine, Ser: 0.55 mg/dL — ABNORMAL LOW (ref 0.61–1.24)
GFR, Estimated: >60 mL/min (ref >60)
Glucose, Bld: 118 mg/dL — ABNORMAL HIGH (ref 70–99)
Potassium: 3.3 mmol/L — ABNORMAL LOW (ref 3.5–5.1)
Sodium: 133 mmol/L — ABNORMAL LOW (ref 135–145)
Total Bilirubin: 2.6 mg/dL — ABNORMAL HIGH (ref 0.3–1.2)
Total Protein: 6.7 g/dL (ref 6.5–8.1)

## 2023-05-12 MED ORDER — METRONIDAZOLE 500 MG PO TABS
500.0000 mg | ORAL_TABLET | Freq: Two times a day (BID) | ORAL | Status: DC
  Administered 2023-05-12 – 2023-05-13 (×2): 500 mg via ORAL
  Filled 2023-05-12 (×2): qty 1

## 2023-05-12 MED ORDER — CIPROFLOXACIN HCL 500 MG PO TABS
500.0000 mg | ORAL_TABLET | Freq: Two times a day (BID) | ORAL | Status: DC
  Administered 2023-05-12 – 2023-05-13 (×2): 500 mg via ORAL
  Filled 2023-05-12 (×2): qty 1

## 2023-05-12 MED ORDER — CLOPIDOGREL BISULFATE 75 MG PO TABS
75.0000 mg | ORAL_TABLET | Freq: Every day | ORAL | Status: DC
  Administered 2023-05-12 – 2023-05-13 (×2): 75 mg via ORAL
  Filled 2023-05-12 (×2): qty 1

## 2023-05-12 MED ORDER — ORAL CARE MOUTH RINSE: 15.0000 mL | OROMUCOSAL | Status: DC | PRN

## 2023-05-12 MED ORDER — POTASSIUM CHLORIDE CRYS ER 20 MEQ PO TBCR
40.0000 meq | EXTENDED_RELEASE_TABLET | Freq: Once | ORAL | Status: AC
  Administered 2023-05-12: 40 meq via ORAL
  Filled 2023-05-12: qty 2

## 2023-05-12 NOTE — Progress Notes (Signed)
Subjective  - POD # 3, status post mesenteric stenting  Still complaining of abdominal pain   Physical Exam:  Right upper quadrant tenderness Right groin is tender with ecchymosis but no hematoma      Assessment/Plan:  POD #3  Acute mesenteric ischemia: The patient's mesenteric symptoms appear to be resolved.  His biggest issue is right upper quadrant pain.  CT scan and ultrasound suggest acute cholecystitis.  General surgery and interventional radiology have evaluated the patient and feel that he is too high risk for intervention.  Antibiotics will be continued.  Ethan Ethan 05/12/2023 6:15 PM --  Vitals:   05/12/23 0425 05/12/23 0750  BP: (!) 109/57 (!) 107/58  Pulse: 95 91  Resp: 17 17  Temp: 98.1 F (36.7 C) 98.8 F (37.1 C)  SpO2: 100% 100%    Intake/Output Summary (Last 24 hours) at 05/12/2023 1815 Last data filed at 05/12/2023 1420 Gross per 24 hour  Intake 1720.65 ml  Output 800 ml  Net 920.65 ml     Laboratory CBC    Component Value Date/Time   WBC 7.0 05/10/2023 0559   HGB 9.6 (L) 05/10/2023 0559   HGB 15.0 09/06/2013 1458   HCT 29.6 (L) 05/10/2023 0559   HCT 42.2 09/06/2013 1458   PLT 102 (L) 05/10/2023 0559   PLT 270 09/06/2013 1458    BMET    Component Value Date/Time   NA 133 (L) 05/12/2023 0450   NA 134 (L) 09/06/2013 1458   K 3.3 (L) 05/12/2023 0450   K 3.5 09/06/2013 1458   CL 112 (H) 05/12/2023 0450   CL 102 09/06/2013 1458   CO2 16 (L) 05/12/2023 0450   CO2 23 09/06/2013 1458   GLUCOSE 118 (H) 05/12/2023 0450   GLUCOSE 167 (H) 09/06/2013 1458   BUN 6 05/12/2023 0450   BUN 4 (L) 09/06/2013 1458   CREATININE 0.55 (L) 05/12/2023 0450   CREATININE 0.49 (L) 09/23/2022 1034   CALCIUM 8.3 (L) 05/12/2023 0450   CALCIUM 8.9 09/06/2013 1458   GFRNONAA >60 05/12/2023 0450   GFRNONAA >60 09/06/2013 1458   GFRAA >60 06/05/2020 1109   GFRAA >60 09/06/2013 1458    COAG Lab Results  Component Value Date   INR 1.6 (H)  05/10/2023   INR 1.5 (H) 05/09/2023   INR 1.5 (H) 05/05/2023   No results found for: "PTT"  Antibiotics Anti-infectives (From admission, onward)    Start     Dose/Rate Route Frequency Ordered Stop   05/12/23 2200  metroNIDAZOLE (FLAGYL) tablet 500 mg        500 mg Oral Every 12 hours 05/12/23 1214     05/12/23 2200  ciprofloxacin (CIPRO) tablet 500 mg        500 mg Oral 2 times daily 05/12/23 1214     05/10/23 1400  ceFEPIme (MAXIPIME) 2 g in sodium chloride 0.9 % 100 mL IVPB  Status:  Discontinued        2 g 200 mL/hr over 30 Minutes Intravenous Every 8 hours 05/10/23 0810 05/12/23 1214   05/10/23 1000  vancomycin (VANCOREADY) IVPB 750 mg/150 mL  Status:  Discontinued        750 mg 150 mL/hr over 60 Minutes Intravenous Every 12 hours 05/09/23 2100 05/10/23 0809   05/09/23 2200  aztreonam (AZACTAM) 1 g in sodium chloride 0.9 % 100 mL IVPB  Status:  Discontinued        1 g 200 mL/hr over 30 Minutes Intravenous Every 8  hours 05/09/23 2004 05/10/23 0808   05/09/23 2200  vancomycin (VANCOREADY) IVPB 1750 mg/350 mL        1,750 mg 175 mL/hr over 120 Minutes Intravenous  Once 05/09/23 2100 05/10/23 0059   05/09/23 2100  metroNIDAZOLE (FLAGYL) IVPB 500 mg  Status:  Discontinued        500 mg 100 mL/hr over 60 Minutes Intravenous Every 12 hours 05/09/23 2004 05/12/23 1214   05/09/23 0732  vancomycin (VANCOCIN) IVPB 1000 mg/200 mL premix        1,000 mg 200 mL/hr over 60 Minutes Intravenous 60 min pre-op 05/09/23 0732 05/09/23 0952   05/05/23 2200  rifaximin (XIFAXAN) tablet 550 mg        550 mg Oral 2 times daily 05/05/23 1632     05/05/23 0800  cefTRIAXone (ROCEPHIN) 1 g in sodium chloride 0.9 % 100 mL IVPB  Status:  Discontinued        1 g 200 mL/hr over 30 Minutes Intravenous Every 24 hours 05/05/23 0758 05/07/23 1514        V. Charlena Cross, M.D., [facility] [place] Office: [phone] Pager:  [phone]

## 2023-05-12 NOTE — Care Management Important Message (Signed)
Important Message  Patient Details  Name: Ethan Wells MRN: [id number] Date of Birth: [birth year]   Medicare Important Message Given:  Yes     [name] 05/12/2023, 12:58 PM

## 2023-05-12 NOTE — Progress Notes (Signed)
PROGRESS NOTE    Ethan Wells  ZOX:[id number] DOB: Jun 13, 1971 DOA: 05/05/2023 PCP: [name], NP    Brief Narrative:  52 y.o. male with medical history significant of alcohol abuse, alcoholic liver cirrhosis with portal gastropathy, s/p of TIPS, superior mesenteric artery stenosis, HTN, HLd, CAD, STEMI, dCHF, GERD, depression with anxiety, adrenal insufficiency (Addison's disease), migraine headache, cardiac arrest due to V-fib, recent admission due to GI bleeding, cocaine abuse, tobacco abuse, who presents with chest pain, abdominal pain, rectal bleeding.   Patient was recently hospitalized from 4/22 - 5/13 due to upper GI bleeding. Pt is s/p of EGD with banding on 4/23 and s/p of TIPS procedure on 4/27. Pt states that he continues to use cocaine and drinking alcohol.  He states that he has chest pain since yesterday, which is located in front and right side of chest, intermittent, come and goes, morbid, both sharp and dull pain, nonradiating.  Associated with dry cough and mild shortness breath.  He also reports abdominal pain, which is diffuse, constant, aching, moderate, nonradiating.    Seen in consultation by vascular surgery and gastroenterology.  Gastroenterology not planning any endoluminal evaluation.  Vascular surgery is recommending angiography with possible reintervention of SMA and celiac.  Nonemergent.  Tentatively planned for Friday 5/23  Patient is significantly lethargic but continues to request IV narcotics.  High suspicion for drug-seeking behavior.   Assessment & Plan:   Principal Problem:   GI bleeding Active Problems:   Anemia due to GI blood loss   Adrenal insufficiency (Addison's disease) (HCC)   Alcoholic cirrhosis (HCC)   Abdominal pain   Mesenteric artery stenosis (HCC)   Chest pain   CAD (coronary artery disease)   UTI (urinary tract infection)   HLD (hyperlipidemia)   Chronic diastolic CHF (congestive heart failure) (HCC)    Hypomagnesemia   Hypokalemia   Essential hypertension   Depression with anxiety   Tobacco abuse   Cocaine abuse (HCC)   Polysubstance abuse (HCC)   Alcohol abuse   Cholecystitis, acute  Acute vs chronic cholecystitis Worsening abdominal pain and two episodes vomiting after vascular procedure on 5/24. CT suggestive of acute cholecystitis and ruq u/s confirms this. Started on abx, gen surg and IR consulted. Hemodynamically stable, no fever or white count, abd pain much improved. Blood cultures neg. Both teams decline intervention - will transition to oral abx per gen surg recs (cipro/flagyl), plan for 10 day course, f/u  Dr. Everlene Farrier 2 wks  Ileus, partial Likely 2/2 above. Resolved, tolerating diet  GI bleeding and anemia due to GI blood loss Hemorrhoidal bleeding Hgb stable in the 9s.   GI evaluated patient, thinks likely hemorrhoidal.  No plans for endoscopic evaluation currently.Marland Kitchen admitted last month for upper gi bleed 2/2 variceal bleed treated with banding and tips.  Plan: Continue PPI for now Monitor hemoglobin Transfuse as needed less than 7 Anusol twice daily as needed  Adrenal insufficiency (Addison's disease) (HCC) -continue home Cortef 5 mg daily  Low back pain This is his primary compliant. Says it's chronic but worsening and now associated with gait dysfunction. MRI of lumbar spine 5/22 shows small disc protrusions, no stenosis, mild foraminal stenosis. - pt advising home health   Alcoholic cirrhosis (HCC):  Status post TIPS INR 1.5, ammonia 59, mental status poor but likely attributed to narcotics Plan: -Lactulose 20 g 3 times daily, increased to 30 tid, BMs still not appropriate, will give lactulose enema today -Rifaximin -Hold Lasix and spironolactone due to soft  blood pressure, restart as appropriate   Constipation Resolved, stools not accurately charted   Abdominal pain and superior mesenteric artery stenosis  Etiology for his abdominal pain is not clear.   Potential differential diagnosis include superior mesenteric artery stenosis, SBP, UTI.  CT scan showed distended gallbladder, but US-RUQ has no evidence of cholecystitis.  Patient does not have fever or leukocytosis.  Does not seem to have acute cholecystitis.  Patient has history of superior mesenteric artery stenosis, CT scan also showed some abnormalities in the SMA.  Vascular performed percutaneous procedure 5/24 with several stents to the SMA. - will re-start plavix given no plan for surgical intervention - cont statin  Hypokalemia Hypomagenesmia Repleting today   HLD (hyperlipidemia) -Crestor   Chronic diastolic CHF (congestive heart failure) (HCC)  2D echo on 04/11/2023 showed EF of 55-60% with grade 2 diastolic dysfunction.  Patient mild plus leg edema, elevated BNP 426, but no oxygen desaturation.  Chest x-ray negative for pulmonary edema.  Does not seem to have acute CHF exacerbation, but patient had a high risk of developing CHF exacerbation.   -Hold Lasix and spironolactone as above  Essential hypertension Blood pressure soft with maps in upper 60s -Hold all blood pressure medications and diuretics due to soft blood pressure -Midodrine 10 mg 3 times daily, wean as tolerated   Depression with anxiety -Continue home medications   Polysubstance abuse Cocaine, alcohol, tobacco -Did counseling about importance of quitting substance use -Nicotine patch -CIWA protocol, no signs withdrawal   DVT prophylaxis: SCDs Code Status: Full Family Communication: None Disposition Plan: Status is: Inpatient Remains inpatient appropriate because: transitioning to oral abx   Level of care: Med-Surg  Consultants:  GI Vascular Gen surg  Procedures:  None  Antimicrobials: See above   Subjective: Seen and examined.  Abd pain resolved  Objective: Vitals:   05/11/23 1932 05/12/23 0425 05/12/23 0429 05/12/23 0750  BP: (!) 97/53 (!) 109/57  (!) 107/58  Pulse: 88 95  91  Resp:  20 17  17   Temp: 98.3 F (36.8 C) 98.1 F (36.7 C)  98.8 F (37.1 C)  TempSrc: Oral Oral    SpO2: 100% 100%  100%  Weight:   82.6 kg   Height:        Intake/Output Summary (Last 24 hours) at 05/12/2023 1209 Last data filed at 05/12/2023 1015 Gross per 24 hour  Intake 1480.65 ml  Output 800 ml  Net 680.65 ml   Filed Weights   05/10/23 0344 05/11/23 0500 05/12/23 0429  Weight: 82.8 kg 83.1 kg 82.6 kg    Examination:  General exam: Lethargic. Respiratory system: Bibasilar crackles.  Normal work of breathing.  Room air Cardiovascular system: S1-S2, RRR, no murmurs, no pedal edema Gastrointestinal system: Soft, mildly distended, normal bowel sounds, diffuse mild generalized tenderness Central nervous system: Alert but lethargic.  Umoving all 4 Back: no midline point tenderness Extremities: Unable to assess power.  Gait not assessed Skin: No rashes, lesions or ulcers Psychiatry: flat affect    Data Reviewed: I have personally reviewed following labs and imaging studies  CBC: Recent Labs  Lab 05/06/23 0811 05/07/23 0519 05/09/23 0505 05/09/23 1921 05/10/23 0559  WBC 4.7 5.1 7.3 10.0 7.0  NEUTROABS  --  2.8  --  8.3* 5.1  HGB 9.6* 9.1* 9.8* 10.7* 9.6*  HCT 30.6* 29.0* 30.5* 32.5* 29.6*  MCV 90.0 90.6 87.9 86.7 88.9  PLT 166 142* 106* 110* 102*   Basic Metabolic Panel: Recent Labs  Lab 05/06/23  1610 05/07/23 0519 05/09/23 0505 05/09/23 1921 05/10/23 0559 05/11/23 0419 05/12/23 0450  NA 139   < > 135 132* 136 135 133*  K 3.9   < > 3.5 3.5 3.3* 3.4* 3.3*  CL 113*   < > 110 105 112* 111 112*  CO2 21*   < > 19* 17* 17* 18* 16*  GLUCOSE 137*   < > 129* 108* 93 88 118*  BUN <5*   < > 7 9 11 11 6   CREATININE 0.78   < > 0.57* 0.57* 0.66 0.57* 0.55*  CALCIUM 8.3*   < > 8.3* 8.5* 8.4* 8.1* 8.3*  MG 2.1  --   --   --  1.5*  --   --    < > = values in this interval not displayed.   GFR: Estimated Creatinine Clearance: 109.2 mL/min (A) (by C-G formula based on SCr  of 0.55 mg/dL (L)). Liver Function Tests: Recent Labs  Lab 05/09/23 0505 05/09/23 1921 05/10/23 0559 05/11/23 0419 05/12/23 0450  AST 79* 96* 79* 61* 54*  ALT 31 37 32 27 24  ALKPHOS 197* 229* 200* 200* 204*  BILITOT 2.4* 4.5* 3.5* 3.2* 2.6*  PROT 6.5 6.9 6.5 6.7 6.7  ALBUMIN 2.5* 2.6* 2.4* 2.6* 2.4*   Recent Labs  Lab 05/09/23 1921  LIPASE 37   Recent Labs  Lab 05/09/23 0505  AMMONIA 58*   Coagulation Profile: Recent Labs  Lab 05/09/23 0505 05/10/23 0559  INR 1.5* 1.6*   Cardiac Enzymes: No results for input(s): "CKTOTAL", "CKMB", "CKMBINDEX", "TROPONINI" in the last 168 hours. BNP (last 3 results) No results for input(s): "PROBNP" in the last 8760 hours. HbA1C: No results for input(s): "HGBA1C" in the last 72 hours.  CBG: Recent Labs  Lab 05/11/23 0805 05/11/23 1148 05/11/23 2102 05/12/23 0755 05/12/23 1145  GLUCAP 89 178* 132* 114* 128*   Lipid Profile: No results for input(s): "CHOL", "HDL", "LDLCALC", "TRIG", "CHOLHDL", "LDLDIRECT" in the last 72 hours.  Thyroid Function Tests: No results for input(s): "TSH", "T4TOTAL", "FREET4", "T3FREE", "THYROIDAB" in the last 72 hours. Anemia Panel: No results for input(s): "VITAMINB12", "FOLATE", "FERRITIN", "TIBC", "IRON", "RETICCTPCT" in the last 72 hours. Sepsis Labs: Recent Labs  Lab 05/09/23 1917 05/09/23 2145  LATICACIDVEN 1.6 1.9    Recent Results (from the past 240 hour(s))  Urine Culture     Status: Abnormal   Collection Time: 05/05/23  1:31 AM   Specimen: Urine, Clean Catch  Result Value Ref Range Status   Specimen Description   Final    URINE, CLEAN CATCH Performed at [facility], [address], [place], [place] [address]    Special Requests   Final    NONE Reflexed from [id number] Performed at [facility], [address] [place], [place] [address]    Culture >=100,000 COLONIES/mL HAFNIA SPECIES (A)  Final   Report Status 05/07/2023 FINAL  Final   Organism ID, Bacteria  HAFNIA SPECIES (A)  Final      Susceptibility   Hafnia species - MIC*    AMPICILLIN >=32 RESISTANT Resistant     CIPROFLOXACIN <=0.25 SENSITIVE Sensitive     GENTAMICIN <=1 SENSITIVE Sensitive     IMIPENEM <=0.25 SENSITIVE Sensitive     NITROFURANTOIN <=16 SENSITIVE Sensitive     TRIMETH/SULFA <=20 SENSITIVE Sensitive     AMPICILLIN/SULBACTAM >=32 RESISTANT Resistant     PIP/TAZO >=128 RESISTANT Resistant     * >=100,000 COLONIES/mL HAFNIA SPECIES  Culture, blood (Routine X  2) w Reflex to ID Panel     Status: None (Preliminary result)   Collection Time: 05/09/23  9:45 PM   Specimen: BLOOD  Result Value Ref Range Status   Specimen Description BLOOD BLOOD LEFT WRIST  Final   Special Requests IN PEDIATRIC BOTTLE Blood Culture adequate volume  Final   Culture   Final    NO GROWTH 3 DAYS Performed at [facility], [address] [place], [place] [address]    Report Status PENDING  Incomplete  Culture, blood (Routine X 2) w Reflex to ID Panel     Status: None (Preliminary result)   Collection Time: 05/09/23  9:45 PM   Specimen: BLOOD  Result Value Ref Range Status   Specimen Description BLOOD LEFT ANTECUBITAL  Final   Special Requests   Final    BOTTLES DRAWN AEROBIC ONLY Blood Culture results may not be optimal due to an inadequate volume of blood received in culture bottles   Culture   Final    NO GROWTH 3 DAYS Performed at [facility], [address] [place], [place] [address]    Report Status PENDING  Incomplete         Radiology Studies: No results found.      Scheduled Meds:  folic acid  1 mg Oral Daily   gabapentin  100 mg Oral BID   hydrocortisone  10 mg Oral Daily   hydrocortisone  5 mg Oral QPC lunch   lactulose  30 g Oral TID   lidocaine  1 patch Transdermal Q24H   midodrine  10 mg Oral TID WC   multivitamin with minerals  1 tablet Oral Daily   nicotine  21 mg Transdermal Daily   pantoprazole  40 mg Oral Daily   potassium chloride   40 mEq Oral Once   QUEtiapine  50 mg Oral QHS   rifaximin  550 mg Oral BID   rosuvastatin  40 mg Oral QHS   sodium chloride flush  10-40 mL Intracatheter Q12H   sodium chloride flush  3 mL Intravenous Q12H   thiamine  100 mg Oral Daily   Continuous Infusions:  sodium chloride 250 mL (05/12/23 0710)   ceFEPime (MAXIPIME) IV 2 g (05/12/23 8295)   metronidazole 500 mg (05/12/23 0839)      LOS: 7 days     Silvano Bilis, MD [facility]   If 7PM-7AM, please contact night-coverage  05/12/2023, 12:09 PM

## 2023-05-12 NOTE — TOC Progression Note (Signed)
Transition of Care [facility]) - Progression Note    Patient Details  Name: Ethan Wells MRN: [id number] Date of Birth: 15-Oct-1971  Transition of Care [facility]) CM/SW Contact  Margarito Liner, LCSW Phone Number: 05/12/2023, 12:46 PM  Clinical Narrative:  Per MD, patient is planning to go to a shelter. CSW met with patient to provide resources. Patient confirmed he is able to go back to boarding house if needed at discharge. Gave information for [employer] in [place] and [place] in [address]. Notified patient that he will have to complete assessment before he can stay at [facility]. Patient will not have a ride at discharge and is requesting assistance.   Expected Discharge Plan: Home/Self Care Barriers to Discharge: Continued Medical Work up  Expected Discharge Plan and Services       Living arrangements for the past 2 months: Boarding House                                       Social Determinants of Health (SDOH) Interventions SDOH Screenings   Food Insecurity: No Food Insecurity (04/27/2023)  Housing: Patient Unable To Answer (04/27/2023)  Transportation Needs: No Transportation Needs (04/27/2023)  Utilities: Not At Risk (04/27/2023)  Alcohol Screen: Medium Risk (10/01/2022)  Tobacco Use: High Risk (05/09/2023)    Readmission Risk Interventions    02/24/2023    3:12 PM 01/17/2023    3:38 PM 10/22/2022   10:47 AM  Readmission Risk Prevention Plan  Transportation Screening Complete Complete Complete  PCP or Specialist Appt within 3-5 Days   Complete  Social Work Consult for Recovery Care Planning/Counseling   Complete  Palliative Care Screening   Not Applicable  Medication Review Oceanographer) Complete Complete Complete  SW Recovery Care/Counseling Consult  Not Complete   SW Consult Not Complete Comments  lethargic   Palliative Care Screening Not Applicable Not Applicable   Skilled Nursing Facility Not Applicable Not Applicable

## 2023-05-12 NOTE — Progress Notes (Signed)
Patient refused lactulose this AM. Kathrynn Running MD made aware.

## 2023-05-13 ENCOUNTER — Encounter: Payer: Self-pay | Admitting: Vascular Surgery

## 2023-05-13 DIAGNOSIS — K922 Gastrointestinal hemorrhage, unspecified: Secondary | ICD-10-CM | POA: Diagnosis not present

## 2023-05-13 LAB — COMPREHENSIVE METABOLIC PANEL
ALT: 21 U/L (ref 0–44)
AST: 48 U/L — ABNORMAL HIGH (ref 15–41)
Albumin: 2.3 g/dL — ABNORMAL LOW (ref 3.5–5.0)
Alkaline Phosphatase: 184 U/L — ABNORMAL HIGH (ref 38–126)
Anion gap: 6 (ref 5–15)
BUN: 7 mg/dL (ref 6–20)
CO2: 22 mmol/L (ref 22–32)
Calcium: 8.5 mg/dL — ABNORMAL LOW (ref 8.9–10.3)
Chloride: 109 mmol/L (ref 98–111)
Creatinine, Ser: 0.54 mg/dL — ABNORMAL LOW (ref 0.61–1.24)
GFR, Estimated: >60 mL/min (ref >60)
Glucose, Bld: 132 mg/dL — ABNORMAL HIGH (ref 70–99)
Potassium: 3.4 mmol/L — ABNORMAL LOW (ref 3.5–5.1)
Sodium: 137 mmol/L (ref 135–145)
Total Bilirubin: 2.5 mg/dL — ABNORMAL HIGH (ref 0.3–1.2)
Total Protein: 6.4 g/dL — ABNORMAL LOW (ref 6.5–8.1)

## 2023-05-13 LAB — GLUCOSE, CAPILLARY: Glucose-Capillary: 188 mg/dL — ABNORMAL HIGH (ref 70–99)

## 2023-05-13 MED ORDER — MIDODRINE HCL 10 MG PO TABS: 10.0000 mg | ORAL_TABLET | Freq: Three times a day (TID) | ORAL | 2 refills | Status: DC

## 2023-05-13 MED ORDER — CLOPIDOGREL BISULFATE 75 MG PO TABS: 75.0000 mg | ORAL_TABLET | Freq: Every day | ORAL | 1 refills | Status: DC

## 2023-05-13 MED ORDER — CIPROFLOXACIN HCL 500 MG PO TABS: 500.0000 mg | ORAL_TABLET | Freq: Two times a day (BID) | ORAL | 0 refills | Status: AC

## 2023-05-13 MED ORDER — METRONIDAZOLE 500 MG PO TABS: 500.0000 mg | ORAL_TABLET | Freq: Two times a day (BID) | ORAL | 0 refills | Status: AC

## 2023-05-13 MED ORDER — GABAPENTIN 100 MG PO CAPS
100.0000 mg | ORAL_CAPSULE | Freq: Two times a day (BID) | ORAL | 1 refills | Status: DC
Start: 2023-05-13 — End: 2023-07-05

## 2023-05-13 NOTE — Progress Notes (Signed)
MD Wilfred Curtis [facility] notified of soft Bps.   "good morning. patients BP running soft today. 4am it was 88/48 MAP 61. I took it this am X2. first 88/55 MAP 66. placed his HOB lower and got 103/54 MAP 68. wanted to make you aware. patient isnt symptomatic. scheduled midodrine given. educated him on holding off of the oxy until it gets a little higher to prevent it from getting lower. "  Patient resting sleeping in the bed comfortably at this time.    05/13/23 0733  Vitals  BP (!) 88/55  MAP (mmHg) 66  MEWS COLOR  MEWS Score Color Green  MEWS Score  MEWS Temp 0  MEWS Systolic 1  MEWS Pulse 0  MEWS RR 0  MEWS LOC 0  MEWS Score 1

## 2023-05-13 NOTE — Consult Note (Addendum)
   [facility] Inpatient Consult   05/13/2023  Ethan Wells 06-04-1971 [id number]  Primary Care Provider:  Kenard Gower [facility])  Patient is currently active with [facility] [[facility]] Care Management for chronic disease management services.  Patient has been engaged by a [facility] and Visual merchandiser. Our community based plan of care has focused on disease management and community resource support. [facility] liaison spoke with pt concerning [facility] services. Pt receptive to an outreach from [facility] for post hospital prevention follow up call. Collaborated with the involved active team concerning pt's discharge today.    Patient will receive a post hospital call and will be evaluated for assessments and disease process education.   Plan: Discharging today with no barriers to address at this time.   Inpatient Transition Of Care [TOC] team member to make aware that [facility] Care Management following.  Of note, [facility] Care Management services does not replace or interfere with any services that are needed or arranged by inpatient [facility] care management team.   For additional questions or referrals please contact:  Elliot Cousin, RN, BSN [facility] Liaison [place]   [facility]  Population Health Office Hours MTWF  8:00 am-6:00 pm Off on Thursday [phone] mobile [phone] [Office toll free line][facility] Office Hours are M-F 8:30 - 5 pm 24 hour nurse advise line [phone] Concierge  [email or web address]

## 2023-05-13 NOTE — Discharge Summary (Signed)
Ethan Wells:[id number] DOB: 30-Jul-1971 DOA: 05/05/2023  PCP: [name], NP  Admit date: 05/05/2023 Discharge date: 05/13/2023  Time spent: 45 minutes  Recommendations for Outpatient Follow-up:  Vascular surgery f/u 2-3 weeks General surgery f/u 2-3 weeks    Discharge Diagnoses:  Principal Problem:   GI bleeding Active Problems:   Anemia due to GI blood loss   Adrenal insufficiency (Addison's disease) (HCC)   Alcoholic cirrhosis (HCC)   Abdominal pain   Mesenteric artery stenosis (HCC)   Chest pain   CAD (coronary artery disease)   UTI (urinary tract infection)   HLD (hyperlipidemia)   Chronic diastolic CHF (congestive heart failure) (HCC)   Hypomagnesemia   Hypokalemia   Essential hypertension   Depression with anxiety   Tobacco abuse   Cocaine abuse (HCC)   Polysubstance abuse (HCC)   Alcohol abuse   Cholecystitis, acute   Discharge Condition: stable  Diet recommendation: heart healthy  Filed Weights   05/11/23 0500 05/12/23 0429 05/13/23 0434  Weight: 83.1 kg 82.6 kg 84.5 kg    History of present illness:  From admission h and p Ethan Wells is a 52 y.o. male with medical history significant of alcohol abuse, alcoholic liver cirrhosis with portal gastropathy, s/p of TIPS, superior mesenteric artery stenosis, HTN, HLd, CAD, STEMI, dCHF, GERD, depression with anxiety, adrenal insufficiency (Addison's disease), migraine headache, cardiac arrest due to V-fib, recent admission due to GI bleeding, cocaine abuse, tobacco abuse, who presents with chest pain, abdominal pain, rectal bleeding.   Patient was recently hospitalized from 4/22 - 5/13 due to upper GI bleeding. Pt is s/p of EGD with banding on 4/23 and s/p of TIPS procedure on 4/27. Pt states that he continues to use cocaine and drinking alcohol.  He states that he has chest pain since yesterday, which is located in front and right side of chest, intermittent, come and goes, morbid,  both sharp and dull pain, nonradiating.  Associated with dry cough and mild shortness breath.  He also reports abdominal pain, which is diffuse, constant, aching, moderate, nonradiating.  Denies nausea, vomiting or diarrhea. No fever or chills.  He reports intermittent rectal bleeding with dark red stool.  Denies symptoms of UTI.  Per his mother, patient is not working, patient has disability.  Hospital Course:  Patient presents with several complaints. One of those was rectal bleeding. This resolved early in his admission. Hgb stable. Was recently admitted for variceal bleed. GI evaluated, thinks this bleeding is from hemorrhoids. Patient also with abdominal pain. Evaluated by vascular surgery, known mesenteric ischemia thought to be a cause or contributor and so patient was taken for percutaneous procedure on 5/24 with vascular surgery where several stents were placed to the SMA. Will start on plavix at discharge and will need f/u with vascular surgery. Patient developed worsening abdominal pain after the procedure and w/u (CT and RUQ u/s) showed acute cholecystitis. No fever or elevated white count, IR and gen surg consulted, they think this is more of a chronic problem and given comorbidities advised against a procedure. Instead they advise a course of antibiotics and outpatient f/u with general surgery (Dr. Everlene Farrier). Of note, abdominal pain resolved by day of discharge. Patient's chief complaint for much of his admission was low back pain. MRI obtained which showed stable mild degenerative disease. His liver failure appears compensated. Patient had soft BPs throughout his hospital stay, home diuretics were held and home cortef was continued. He remains cocaine positive and his polysubstance  abuse obviously continues to be a fundamental impediment to wellness. Patient declined PT evaluations throughout his hospital course and he does not qualify for home health. He lives at a boarding house, and homeless  resources were provided by [facility]. This is the patient's 4th admission this year, and unfortunately he remains at high risk for readmission.  Procedures: See above   Consultations: Vascular surgery, gen surg, IR  Discharge Exam: Vitals:   05/13/23 0735 05/13/23 0843  BP: (!) 103/54 (!) 100/54  Pulse:  87  Resp:  18  Temp:  98.4 F (36.9 C)  SpO2:  98%    General exam: Lethargic. Respiratory system: Bibasilar crackles.  Normal work of breathing.  Room air Cardiovascular system: S1-S2, RRR, no murmurs, no pedal edema Gastrointestinal system: Soft, mildly distended, normal bowel sounds, diffuse mild generalized tenderness Central nervous system: Alert but lethargic.  Umoving all 4 Back: no midline point tenderness Extremities: Unable to assess power.  Gait not assessed Skin: No rashes, lesions or ulcers Psychiatry: flat affect  Discharge Instructions   Discharge Instructions     Diet - low sodium heart healthy   Complete by: As directed    Increase activity slowly   Complete by: As directed       Allergies as of 05/13/2023       Reactions   Penicillins Hives, Rash   Tolerated Ceftriaxone 10/2022        Medication List     STOP taking these medications    furosemide 20 MG tablet Commonly known as: LASIX   potassium chloride SA 20 MEQ tablet Commonly known as: KLOR-CON M   spironolactone 25 MG tablet Commonly known as: ALDACTONE       TAKE these medications    CertaVite/Antioxidants Tabs Take 1 tablet by mouth daily.   multivitamin with minerals Tabs tablet Take 1 tablet by mouth daily.   ciprofloxacin 500 MG tablet Commonly known as: CIPRO Take 1 tablet (500 mg total) by mouth 2 (two) times daily for 8 days.   clopidogrel 75 MG tablet Commonly known as: PLAVIX Take 1 tablet (75 mg total) by mouth daily. Start taking on: May 14, 2023   folic acid 1 MG tablet Commonly known as: FOLVITE Take 1 tablet (1 mg total) by mouth daily.    gabapentin 100 MG capsule Commonly known as: NEURONTIN Take 1 capsule (100 mg total) by mouth 2 (two) times daily. What changed:  medication strength how much to take   hydrocortisone 10 MG tablet Commonly known as: CORTEF Take 1 tablet (10 mg total) by mouth daily.   hydrocortisone 10 MG tablet Commonly known as: CORTEF Take 1/2 tablet (5 mg total) by mouth daily after lunch.   lactulose 10 GM/15ML solution Commonly known as: CHRONULAC Take 30 mLs (20 g total) by mouth 3 (three) times daily. Titrate to 1-2 BM per day   metroNIDAZOLE 500 MG tablet Commonly known as: FLAGYL Take 1 tablet (500 mg total) by mouth every 12 (twelve) hours for 8 days.   midodrine 10 MG tablet Commonly known as: PROAMATINE Take 1 tablet (10 mg total) by mouth 3 (three) times daily. Skip the dose if Systolic BP greater than 110 mmHg and or HR greater than 100 What changed:  medication strength how much to take   nitroGLYCERIN 0.4 MG SL tablet Commonly known as: NITROSTAT Place 1 tablet (0.4 mg total) under the tongue every 5 (five) minutes as needed for chest pain.   pantoprazole 40 MG tablet Commonly  known as: PROTONIX Take 1 tablet (40 mg total) by mouth 2 (two) times daily.   QUEtiapine 50 MG tablet Commonly known as: SEROQUEL Take 1 tablet (50 mg total) by mouth at bedtime.   rosuvastatin 40 MG tablet Commonly known as: CRESTOR Take 1 tablet (40 mg total) by mouth daily.   thiamine 100 MG tablet Commonly known as: VITAMIN B1 Take 1 tablet (100 mg total) by mouth daily.   Xifaxan 550 MG Tabs tablet Generic drug: rifaximin Take 1 tablet (550 mg total) by mouth 2 (two) times daily.       Allergies  Allergen Reactions   Penicillins Hives and Rash    Tolerated Ceftriaxone 10/2022      The results of significant diagnostics from this hospitalization (including imaging, microbiology, ancillary and laboratory) are listed below for reference.    Significant Diagnostic  Studies: [place] EKG SITE RITE  Result Date: 05/10/2023 If Site Rite image not attached, placement could not be confirmed due to current cardiac rhythm.  US Abdomen Limited RUQ (LIVER/GB)  Result Date: 05/09/2023 CLINICAL DATA:  [id number] Cholecystitis [id number] EXAM: ULTRASOUND ABDOMEN LIMITED RIGHT UPPER QUADRANT COMPARISON:  None Available.  CT abdomen pelvis 05/09/2023 FINDINGS: Gallbladder: Marked gallbladder wall thickening (measuring up to 9 mm) and hyperemia. Gallbladder sludge and gallstones noted within the gallbladder lumen. Question trace pericholecystic fluid. Positive sonographic Murphy sign noted by sonographer. Common bile duct: Diameter: 5 mm. Liver: Nodular hepatic contour. No focal lesion identified. Patent tips. Within normal limits in parenchymal echogenicity. Portal vein is patent on color Doppler imaging with normal direction of blood flow towards the liver. Other: None. IMPRESSION: 1. Cholelithiasis and gallbladder sludge with associated acute cholecystitis. 2. Cirrhosis with patent tips. No focal liver lesions identified. Please note that liver protocol enhanced MR and CT are the most sensitive tests for the screening detection of hepatocellular carcinoma in the high risk setting of cirrhosis. Electronically Signed   By: Tish Frederickson M.D.   On: 05/09/2023 19:43   CT CHEST ABDOMEN PELVIS WO CONTRAST  Result Date: 05/09/2023 CLINICAL DATA:  Abdominal pain. Status post revascularization of SMA and celiac artery with stenting. EXAM: CT CHEST, ABDOMEN AND PELVIS WITHOUT CONTRAST TECHNIQUE: Multidetector CT imaging of the chest, abdomen and pelvis was performed following the standard protocol without IV contrast. RADIATION DOSE REDUCTION: This exam was performed according to the departmental dose-optimization program which includes automated exposure control, adjustment of the mA and/or kV according to patient size and/or use of iterative reconstruction technique. COMPARISON:  05/05/2023  FINDINGS: CT CHEST FINDINGS Cardiovascular: Heart size appears normal. Aortic atherosclerosis and coronary artery calcifications. No pericardial effusion identified. Mediastinum/Nodes: Thyroid gland, trachea, and esophagus are unremarkable. No enlarged mediastinal or axillary lymph nodes. Lungs/Pleura: Mild thickening along the posterior pleura overlying the lower lobes. No significant pleural effusion, airspace consolidation or pneumothorax. Mild to moderate emphysema. Scarring identified within the lingula and posterior lung bases. Bilateral peripheral areas of interstitial reticulation and subpleural banding noted which likely reflects chronic postinflammatory change. Stable subpleural nodule within the anterior right upper lobe measuring 4 mm, image 56/5. Subpleural nodule within the anterior right middle lobe is also unchanged measuring 5 mm. Musculoskeletal: Subacute to chronic right 6 anterior rib fracture, image 96/5. Possible acute fracture involving the anterior aspect of the right fifth rib, image 83/5. Remote left anterior third and fourth fractures identified. CT ABDOMEN PELVIS FINDINGS Hepatobiliary: Morphologic features of cirrhosis identified within the liver. Status post tips. No focal liver lesion identified. There is diffuse  gallbladder wall thickening with surrounding inflammation. Gallbladder wall thickness measures up to 8 mm. No calcified gallstones identified. No signs of bile duct dilatation. Pancreas: Unremarkable. No pancreatic ductal dilatation or surrounding inflammatory changes. Spleen: Spleen measures 14.9 cm in cranial caudal dimension. No focal abnormality. Adrenals/Urinary Tract: Normal adrenal glands. Contrast material noted within the collecting system of both kidneys. No hydronephrosis or mass. Urinary bladder is filled with contrast material from procedure performed earlier today. No focal bladder abnormality. Stomach/Bowel: Stomach appears within normal limits. Small bowel  loops are mildly dilated measuring up to 3.1 cm. No abrupt caliber change. Gradual decrease caliber of the small bowel loops noted distally. There is a moderate stool burden identified within the colon concerning for constipation. The appendix is not confidently identified. Vascular/Lymphatic: Aortic atherosclerosis. Stents identified within the celiac and SMA. No abdominopelvic adenopathy. Reproductive: Prostate is unremarkable. Other: No significant free fluid or fluid collections. Postprocedural change identified within the right groin region. Musculoskeletal: No acute or significant osseous findings. IMPRESSION: 1. Diffuse gallbladder wall thickening and inflammation. Findings are concerning for acute cholecystitis. Consider further evaluation with right upper quadrant ultrasound. 2. Mild dilatation of small bowel loops without abrupt caliber change. Gradual decrease caliber of the small bowel loops noted distally. Findings are favored to represent ileus. 3. Moderate stool burden identified within the colon concerning for constipation. 4. Subacute to chronic right 6th anterior rib fracture. Possible acute fracture involving the anterior aspect of the right fifth rib. 5. Cirrhosis with splenomegaly. Status post TIPS. 6.  Aortic Atherosclerosis (ICD10-I70.0). Electronically Signed   By: Signa Kell M.D.   On: 05/09/2023 18:44   DG Chest Port 1 View  Result Date: 05/09/2023 CLINICAL DATA:  Chest pain. EXAM: PORTABLE CHEST 1 VIEW COMPARISON:  May 04, 2023. FINDINGS: Mild cardiomegaly. Both lungs are clear. The visualized skeletal structures are unremarkable. IMPRESSION: No active disease. Electronically Signed   By: Lupita Raider M.D.   On: 05/09/2023 16:08   PERIPHERAL VASCULAR CATHETERIZATION  Result Date: 05/09/2023 See surgical note for result.  MR LUMBAR SPINE WO CONTRAST  Result Date: 05/07/2023 CLINICAL DATA:  Worsening low back pain with difficulty walking EXAM: MRI LUMBAR SPINE WITHOUT  CONTRAST TECHNIQUE: Multiplanar, multisequence MR imaging of the lumbar spine was performed. No intravenous contrast was administered. COMPARISON:  12/08/2020 FINDINGS: Segmentation:  Standard. Alignment:  Physiologic. Vertebrae:  No fracture, evidence of discitis, or bone lesion. Conus medullaris and cauda equina: Conus extends to the L1 level. Conus and cauda equina appear normal. Paraspinal and other soft tissues: Negative. Disc levels: L1-L2: Normal disc space and facet joints. No spinal canal stenosis. No neural foraminal stenosis. L2-L3: Normal disc space and facet joints. No spinal canal stenosis. No neural foraminal stenosis. L3-L4: Small disc bulge. No spinal canal stenosis. No neural foraminal stenosis. L4-L5: Unchanged small central disc protrusion. No spinal canal stenosis. No neural foraminal stenosis. L5-S1: Small central disc protrusion. No spinal canal stenosis. Mild left neural foraminal stenosis. Visualized sacrum: Normal. IMPRESSION: 1. Unchanged small central disc protrusions at L4-L5 and L5-S1 without spinal canal stenosis. 2. Mild left L5-S1 neural foraminal stenosis. Electronically Signed   By: Deatra Robinson M.D.   On: 05/07/2023 23:19   DG Lumbar Spine 2-3 Views  Result Date: 05/06/2023 CLINICAL DATA:  Low back pain. EXAM: LUMBAR SPINE - 2-3 VIEW COMPARISON:  CT abdomen pelvis-05/05/2023 FINDINGS: There are 5 non rib-bearing lumbar type vertebral bodies. Normal alignment of the lumbar spine. No anterolisthesis or retrolisthesis. Lumbar vertebral body heights  appear preserved. Mild to moderate multilevel lumbar spine DDD, worse at L3-L4 and L4-L5 with disc space height loss, endplate irregularity and sclerosis. Limited visualization of the bilateral SI joints is normal. TIPS stent overlies the right upper abdominal quadrant. Vascular stent overlies the expected location of the SMA IMPRESSION: Mild-to-moderate multilevel lumbar spine DDD, worse at L3-L4 and L4-L5. Electronically Signed    By: Simonne Come M.D.   On: 05/06/2023 10:04   US ABDOMEN LIMITED RUQ (LIVER/GB)  Result Date: 05/05/2023 CLINICAL DATA:  Right upper quadrant abdominal pain EXAM: ULTRASOUND ABDOMEN LIMITED RIGHT UPPER QUADRANT COMPARISON:  None Available. FINDINGS: Gallbladder: The gallbladder is mildly distended and a small amount of layering sludge is seen within the gallbladder. However, there is no gallbladder wall thickening and no pericholecystic fluid is identified. The sonographic Eulah Pont sign is reportedly NEGATIVE. Common bile duct: Diameter: 4 mm in proximal diameter Liver: Subtle contour nodularity of the liver is seen in keeping with cirrhosis. Tips shunt is partially visualized within the right hepatic lobe. No focal intrahepatic mass is identified. No intrahepatic biliary ductal dilation. Portal vein is patent on color Doppler imaging with normal direction of blood flow towards the liver. There is antegrade flow within the proximal portion of the shunt and retrograde flow within the left portal vein, expected following shunt placement. Other: No ascites IMPRESSION: 1. Gallbladder sludge without sonographic evidence of acute cholecystitis. 2. Hepatic cirrhosis. 3. Patent portal vein with normal direction of blood flow. There is retrograde flow within the left portal vein, expected following shunt placement. Electronically Signed   By: Helyn Numbers M.D.   On: 05/05/2023 04:03   CT ABDOMEN PELVIS W CONTRAST  Result Date: 05/05/2023 CLINICAL DATA:  Abdominal pain, acute, nonlocalized EXAM: CT ABDOMEN AND PELVIS WITH CONTRAST TECHNIQUE: Multidetector CT imaging of the abdomen and pelvis was performed using the standard protocol following bolus administration of intravenous contrast. RADIATION DOSE REDUCTION: This exam was performed according to the departmental dose-optimization program which includes automated exposure control, adjustment of the mA and/or kV according to patient size and/or use of iterative  reconstruction technique. CONTRAST:  OMNIPAQUE IOHEXOL 300 MG/ML  SOLN COMPARISON:  09/30/2022 FINDINGS: Lower chest: No acute abnormality. Stable cardiomegaly. There is circumferential thickening of the distal esophagus which appears stable since prior examination, poss fleck ting changes of esophagitis. Hepatobiliary: Right portal vein-hepatic vein tips shunt is in place extending from the portal bifurcation to the hepatocaval junction. The shunt is patent. Mild contour nodularity of the liver is seen in keeping with changes of cirrhosis. No enhancing intrahepatic mass. The gallbladder is distended and there is trace pericholecystic inflammatory stranding which may reflect changes of acute cholecystitis. No intra or extrahepatic biliary ductal dilation. Pancreas: Unremarkable Spleen: Stable mild splenomegaly with the spleen measuring 13.9 cm in greatest dimension. No intrasplenic lesions are seen. Splenic vein is patent. Adrenals/Urinary Tract: Adrenal glands are unremarkable. Kidneys are normal, without renal calculi, focal lesion, or hydronephrosis. Bladder is unremarkable. Stomach/Bowel: The stomach, small bowel, and large bowel are unremarkable. The appendix is not clearly identified and is likely absent. No free intraperitoneal gas or fluid. Vascular/Lymphatic: Superior mesenteric artery stenting has been performed with the luminal diameter of the stented segment measuring 2 mm in diameter with small amount of neointimal hyperplasia noted. The vessel distally remains patent. Celiac axis demonstrates a 50-75% narrowing proximally secondary to mass effect from the median arcuate ligament. Inferior mesenteric artery is widely patent. Mild aortoiliac atherosclerotic calcification. No aortic aneurysm. Reproductive: Prostate  is unremarkable. Other: No abdominal wall hernia Musculoskeletal: No acute bone abnormality. IMPRESSION: 1. Distended gallbladder with trace pericholecystic inflammatory stranding which  may reflect changes of acute cholecystitis. Correlation with clinical examination and liver enzymes is recommended. If indicated, this would be better assessed with dedicated right upper quadrant sonography. 2. Morphologic changes in keeping with cirrhosis and portal venous hypertension with mild splenomegaly. Patent TIPS shunt. 3. Interval superior mesenteric artery stenting with luminal diameter of 2 mm with small amount of neointimal hyperplasia noted. The vessel distally remains patent. Given findings within the celiac axis below, clinical correlation for signs and symptoms of chronic mesenteric ischemia is recommended. Endovascular revision of the stented segment may be warranted. 4. 50-75% narrowing of the proximal celiac axis secondary to mass effect from the median arcuate ligament. 5. Circumferential thickening of the distal esophagus, stable since prior examination, possibly reflecting changes of esophagitis. 6. Stable cardiomegaly. Aortic Atherosclerosis (ICD10-I70.0). Electronically Signed   By: Helyn Numbers M.D.   On: 05/05/2023 02:42   CT Head Wo Contrast  Result Date: 05/05/2023 CLINICAL DATA:  Altered mental status. EXAM: CT HEAD WITHOUT CONTRAST TECHNIQUE: Contiguous axial images were obtained from the base of the skull through the vertex without intravenous contrast. RADIATION DOSE REDUCTION: This exam was performed according to the departmental dose-optimization program which includes automated exposure control, adjustment of the mA and/or kV according to patient size and/or use of iterative reconstruction technique. COMPARISON:  Head CT dated 09/30/2022. FINDINGS: Brain: Mild age-related atrophy and chronic microvascular ischemic changes. There is no acute intracranial hemorrhage. No mass effect or midline shift. No extra-axial fluid collection. Vascular: No hyperdense vessel or unexpected calcification. Skull: Normal. Negative for fracture or focal lesion. Sinuses/Orbits: No acute finding.  Other: None IMPRESSION: 1. No acute intracranial pathology. 2. Mild age-related atrophy and chronic microvascular ischemic changes. Electronically Signed   By: Elgie Collard M.D.   On: 05/05/2023 02:28   DG Chest 2 View  Result Date: 05/04/2023 CLINICAL DATA:  Chest pain. EXAM: CHEST - 2 VIEW COMPARISON:  Chest radiograph dated 04/16/2023. FINDINGS: No focal consolidation, pleural effusion, or pneumothorax. Mild cardiomegaly. No acute osseous pathology. IMPRESSION: 1. No active cardiopulmonary disease. 2. Mild cardiomegaly. Electronically Signed   By: Elgie Collard M.D.   On: 05/04/2023 23:41   DG Swallowing Func-Speech Pathology  Result Date: 04/21/2023 Table formatting from the original result was not included. Modified Barium Swallow Study Patient Details Name: TOLUWALASE MITROVIC MRN: [id number] Date of Birth: 04-Jun-1971 Today's Date: 04/21/2023 HPI/PMH: HPI: Patient is a 52 y.o. male with PMH: recurrent GI bleed with known variceal bleed, ischemic cardiomyopathy, ETOH abuse, polysubstance abuse, HTN, HLD, depression and anxiety. He presented to [facility] ED on 04/07/23 with acute GI bleed. He was admitted previous month for GI bleed and TIPS procedure recommended at that time but patient declined it. In ED, patient was hypotensive, mildly tachycardic. EGD completed 4/23. He was intubated 4/23 for flash pulmonary edema and extubated 4/25. He was reintubated on 4/27 for TIPS procedure but unable to be extubated following procedure. On 4/30 NG was placed and patient was extubated.  Pt has undergone BSE with recommendation for NPO - Clinical Impression: Clinical Impression: Pt presented with functional oropharyngeal swallow with adequate oral control of bolus, reliable laryngeal vestibule closure with no penetration/aspiration, normal pharyngeal stripping with no residue post-swallow, and normal ability to manage swallowing a pill with thin liquid with no aspiration and  transfer of pill through esophagus.  Recommend starting a regular diet,  thin liquids; meds in liquid. Mental status will be the primary barrier to adequate PO intake. SLP will f/u x1 to ensure functionality during mealtime. D/W RD, RN, MD. Factors that may increase risk of adverse event in presence of aspiration Rubye Oaks & Clearance Coots 2021): Factors that may increase risk of adverse event in presence of aspiration Rubye Oaks & Clearance Coots 2021): Reduced cognitive function Recommendations/Plan: Swallowing Evaluation Recommendations Swallowing Evaluation Recommendations Recommendations: PO diet PO Diet Recommendation: Regular; Thin liquids (Level 0) Liquid Administration via: Cup; Straw Medication Administration: Whole meds with liquid Supervision: Patient able to self-feed; Set-up assistance for safety Oral care recommendations: Oral care BID (2x/day) Treatment Plan Treatment Plan Treatment recommendations: Therapy as outlined in treatment plan below Follow-up recommendations: No SLP follow up Treatment frequency: Min 1x/week Treatment duration: 1 week Interventions: Diet toleration management by SLP Recommendations Recommendations for follow up therapy are one component of a multi-disciplinary discharge planning process, led by the attending physician.  Recommendations may be updated based on patient status, additional functional criteria and insurance authorization. Assessment: Orofacial Exam: Orofacial Exam Oral Cavity - Dentition: Poor condition; Missing dentition Orofacial Anatomy: WFL Oral Motor/Sensory Function: WFL Anatomy: Anatomy: WFL Boluses Administered: Boluses Administered Boluses Administered: Thin liquids (Level 0); Mildly thick liquids (Level 2, nectar thick); Puree; Solid  Oral Impairment Domain: Oral Impairment Domain Lip Closure: No labial escape Tongue control during bolus hold: Escape to lateral buccal cavity/floor of mouth Bolus preparation/mastication: Timely and efficient chewing and mashing Bolus transport/lingual motion: Brisk tongue  motion Oral residue: Trace residue lining oral structures Location of oral residue : Tongue Initiation of pharyngeal swallow : Pyriform sinuses  Pharyngeal Impairment Domain: Pharyngeal Impairment Domain Soft palate elevation: No bolus between soft palate (SP)/pharyngeal wall (PW) Laryngeal elevation: Complete superior movement of thyroid cartilage with complete approximation of arytenoids to epiglottic petiole Anterior hyoid excursion: Complete anterior movement Epiglottic movement: Complete inversion Laryngeal vestibule closure: Complete, no air/contrast in laryngeal vestibule Pharyngeal stripping wave : Present - complete Pharyngeal contraction (A/P view only): N/A Pharyngoesophageal segment opening: Complete distension and complete duration, no obstruction of flow Tongue base retraction: No contrast between tongue base and posterior pharyngeal wall (PPW) Pharyngeal residue: Complete pharyngeal clearance Location of pharyngeal residue: N/A  Esophageal Impairment Domain: Esophageal Impairment Domain Esophageal clearance upright position: Complete clearance, esophageal coating Pill: Esophageal Impairment Domain Esophageal clearance upright position: Complete clearance, esophageal coating Penetration/Aspiration Scale Score: Penetration/Aspiration Scale Score 1.  Material does not enter airway: Thin liquids (Level 0); Mildly thick liquids (Level 2, nectar thick); Puree; Solid; Pill Compensatory Strategies: Compensatory Strategies Compensatory strategies: Yes Straw: Effective   General Information: Caregiver present: No  Diet Prior to this Study: NPO   Temperature : Febrile   Respiratory Status: WFL   Supplemental O2: None (Room air)   History of Recent Intubation: Yes  Behavior/Cognition: Alert; Cooperative; Requires cueing Self-Feeding Abilities: Able to self-feed Baseline vocal quality/speech: Hypophonia/low volume Volitional Cough: Able to elicit Volitional Swallow: Able to elicit Exam Limitations: Limited  visibility; Excessive movement Goal Planning: Prognosis for improved oropharyngeal function: Good No data recorded No data recorded Patient/Family Stated Goal: HOB reclined No data recorded Pain: Pain Assessment Pain Assessment: No/denies pain Pain Score: 8 Faces Pain Scale: 4 Breathing: 0 Negative Vocalization: 0 Facial Expression: 0 Body Language: 0 Consolability: 0 PAINAD Score: 0 Facial Expression: 0 Body Movements: 0 Muscle Tension: 0 Compliance with ventilator (intubated pts.): N/A Vocalization (extubated pts.): 0 CPOT Total: 0 Pain Location: abdomen Pain Descriptors / Indicators: Aching; Grimacing; Guarding Pain Intervention(s): Limited  activity within patient's tolerance; Monitored during session; Repositioned End of Session: Start Time:SLP Start Time (ACUTE ONLY): 1610 Stop Time: SLP Stop Time (ACUTE ONLY): 9604 Time Calculation:SLP Time Calculation (min) (ACUTE ONLY): 18 min Charges: SLP Evaluations $ SLP Speech Visit: 1 Visit SLP Evaluations $BSS Swallow: 1 Procedure $MBS Swallow: 1 Procedure $Swallowing Treatment: 1 Procedure SLP visit diagnosis: SLP Visit Diagnosis: Dysphagia, unspecified (R13.10) Past Medical History: Past Medical History: Diagnosis Date  CAD (coronary artery disease)   a. 01/2011 Anterior STEMI/Cath/PCI: LM nl, LAD 100d (3.5x31mm Vision BMS placed), LCX 73m, RI 50, RCA min irregs, EF 40% w/ apical, inferoapical HK; b. 10/2022 NSTSEMI/Cath: LM nl, LAD 5p/m, RI 85, LCX 168m, OM1 mild dzs, OM3 100 fills via L->L collats from dLAD, RCA 80p, 144m, RPDA fills via collats from LAD. EF 45-50%-->Med Rx.  Cardiac arrest - ventricular fibrillation   a. In setting of STEMI 01/2011  Chronic pain   Cocaine abuse (HCC)   Depression with anxiety   ETOH abuse   a. 6-12 beers/day  GERD (gastroesophageal reflux disease)   GI bleed   Hemorrhoids   Hyperlipidemia   Hypertension   Ischemic cardiomyopathy   a. 06/2011 Echo: EF 45-50%, No rwma; b. 10/2022 Echo: EF 55-60%, no rwma, nl RV fxn.  Marijuana abuse    Migraines   Tobacco abuse   a. 1/2 ppd x 26 yrs Past Surgical History: Past Surgical History: Procedure Laterality Date  COLONOSCOPY  08/09/2021  Procedure: COLONOSCOPY;  Surgeon: Midge Minium, MD;  Location: [facility] ENDOSCOPY;  Service: Endoscopy;;  CORONARY ANGIOPLASTY WITH STENT PLACEMENT    ESOPHAGEAL BANDING  04/08/2023  Procedure: ESOPHAGEAL BANDING;  Surgeon: Tressia Danas, MD;  Location: [facility] ENDOSCOPY;  Service: Gastroenterology;;  ESOPHAGOGASTRODUODENOSCOPY N/A 05/30/2022  Procedure: ESOPHAGOGASTRODUODENOSCOPY (EGD);  Surgeon: Toney Reil, MD;  Location: [facility] ENDOSCOPY;  Service: Gastroenterology;  Laterality: N/A;  ESOPHAGOGASTRODUODENOSCOPY (EGD) WITH PROPOFOL N/A 03/01/2021  Procedure: ESOPHAGOGASTRODUODENOSCOPY (EGD) WITH PROPOFOL;  Surgeon: Toney Reil, MD;  Location: [facility] SURGERY [facility];  Service: Endoscopy;  Laterality: N/A;  ESOPHAGOGASTRODUODENOSCOPY (EGD) WITH PROPOFOL N/A 08/09/2021  Procedure: ESOPHAGOGASTRODUODENOSCOPY (EGD) WITH PROPOFOL;  Surgeon: Midge Minium, MD;  Location: [facility] ENDOSCOPY;  Service: Endoscopy;  Laterality: N/A;  ESOPHAGOGASTRODUODENOSCOPY (EGD) WITH PROPOFOL N/A 03/01/2023  Procedure: ESOPHAGOGASTRODUODENOSCOPY (EGD) WITH PROPOFOL;  Surgeon: Jaynie Collins, DO;  Location: [facility] ENDOSCOPY;  Service: Gastroenterology;  Laterality: N/A;  ESOPHAGOGASTRODUODENOSCOPY (EGD) WITH PROPOFOL N/A 04/08/2023  Procedure: ESOPHAGOGASTRODUODENOSCOPY (EGD) WITH PROPOFOL;  Surgeon: Tressia Danas, MD;  Location: [facility] ENDOSCOPY;  Service: Gastroenterology;  Laterality: N/A;  EVALUATION UNDER ANESTHESIA WITH HEMORRHOIDECTOMY N/A 10/31/2022  Procedure: EXAM UNDER ANESTHESIA WITH SUTURE LIGATION OF TWO BLEEDING PEDICLES;  Surgeon: Henrene Dodge, MD;  Location: [facility] ORS;  Service: General;  Laterality: N/A;  FLEXIBLE SIGMOIDOSCOPY N/A 10/02/2022  Procedure: FLEXIBLE SIGMOIDOSCOPY;  Surgeon: Meryl Dare, MD;  Location: [facility] ENDOSCOPY;  Service: Gastroenterology;  Laterality:  N/A;  IR TIPS  04/11/2023  IR US GUIDE VASC ACCESS RIGHT  04/11/2023  IR US GUIDE VASC ACCESS RIGHT  04/11/2023  IR US GUIDE VASC ACCESS RIGHT  04/11/2023  LEFT HEART CATH AND CORONARY ANGIOGRAPHY N/A 11/04/2022  Procedure: LEFT HEART CATH AND CORONARY ANGIOGRAPHY;  Surgeon: Iran Ouch, MD;  Location: [facility] INVASIVE CV LAB;  Service: Cardiovascular;  Laterality: N/A;  RADIOLOGY WITH ANESTHESIA N/A 04/11/2023  Procedure: IR WITH ANESTHESIA;  Surgeon: Oley Balm, MD;  Location: [facility] OR;  Service: Radiology;  Laterality: N/A;  VISCERAL ANGIOGRAPHY N/A 10/24/2022  Procedure: VISCERAL ANGIOGRAPHY;  Surgeon: Festus Barren  S, MD;  Location: [facility] INVASIVE CV LAB;  Service: Cardiovascular;  Laterality: N/A; Amanda L. Samson Frederic, MA CCC/SLP Clinical Specialist - Acute Care SLP Acute Rehabilitation Services Office number [phone] Blenda Mounts Laurice 04/21/2023, 9:57 AM  US Abdomen Complete  Result Date: 04/17/2023 CLINICAL DATA:  Abdominal pain. EXAM: ABDOMEN ULTRASOUND COMPLETE COMPARISON:  February 28, 2023. FINDINGS: Gallbladder: No gallstones or wall thickening visualized. No sonographic Murphy sign noted by sonographer. Mild amount of sludge is noted. Common bile duct:Not visualized. Liver: Slightly nodular hepatic margins are noted suggesting cirrhosis. No definite focal sonographic abnormality is noted. Portal vein is patent on color Doppler imaging with normal direction of blood flow towards the liver. IVC: No abnormality visualized. Pancreas: Not visualized due to overlying bowel gas. Spleen: Size and appearance within normal limits. Right Kidney: Length: 11.5 cm. Echogenicity within normal limits. No mass or hydronephrosis visualized. Left Kidney: Length: 11.3 cm. Echogenicity within normal limits. No mass or hydronephrosis visualized. Abdominal aorta: Not visualized. Other findings: None. IMPRESSION: Slightly nodular hepatic margins are noted suggesting hepatic cirrhosis. Common bile duct, pancreas and abdominal  aorta were not visualized on this exam. Small amount of sludge noted within gallbladder. Electronically Signed   By: Lupita Raider M.D.   On: 04/17/2023 13:01   DG Chest Port 1 View  Result Date: 04/17/2023 CLINICAL DATA:  Abdominal pain and hypoxia.  [id number].  [id number]. EXAM: PORTABLE CHEST 1 VIEW PORTABLE SUPINE ABDOMEN, 2 FILMS COMPARISON:  Abdomen film earlier today at 11:04 a.m., portable chest 04/13/2023 FINDINGS: Portable chest at 11:31 p.m.: Prior NGT has been exchanged for a Dobbhoff feeding tube, which terminates in the gastric antrum in good position. Interstitial and faint patchy hazy opacities are noted in the lower lung fields but significantly improved. The upper lung fields have cleared. There is no substantial pleural effusion. Findings most likely indicating clearing edema and/or pneumonia. The heart is enlarged. There is mild central vascular fullness with improvement. Mediastinum is normally outlined. Interval removal prior ETT, left IJ central line. Portable supine abdomen, 11:35 p.m., 2 films: Again noted is a TIPS stent in the liver. There is aeration of the intestinal tract without pathologic dilatation with colonic gas through to the rectum. No pathologic calcifications or acute radiographic findings are seen and no supine evidence of free air. There are degenerative changes of the spine. No significant osseous findings. IMPRESSION: 1. Dobbhoff feeding tube terminates in the distal gastric antrum in good position. 2. Interstitial and faint patchy hazy opacities in the lower lung fields but significantly improved. Findings most likely indicating clearing edema and/or pneumonia. 3. Cardiomegaly. 4. Aeration throughout the intestinal tract, without pathologic dilatation. 5. TIPS stent in the liver. Electronically Signed   By: Almira Bar M.D.   On: 04/17/2023 00:07   DG Abd 1 View  Result Date: 04/17/2023 CLINICAL DATA:  Abdominal pain and hypoxia.  [id number].  [id number]. EXAM: PORTABLE CHEST  1 VIEW PORTABLE SUPINE ABDOMEN, 2 FILMS COMPARISON:  Abdomen film earlier today at 11:04 a.m., portable chest 04/13/2023 FINDINGS: Portable chest at 11:31 p.m.: Prior NGT has been exchanged for a Dobbhoff feeding tube, which terminates in the gastric antrum in good position. Interstitial and faint patchy hazy opacities are noted in the lower lung fields but significantly improved. The upper lung fields have cleared. There is no substantial pleural effusion. Findings most likely indicating clearing edema and/or pneumonia. The heart is enlarged. There is mild central vascular fullness with improvement. Mediastinum is normally outlined. Interval removal prior ETT, left  IJ central line. Portable supine abdomen, 11:35 p.m., 2 films: Again noted is a TIPS stent in the liver. There is aeration of the intestinal tract without pathologic dilatation with colonic gas through to the rectum. No pathologic calcifications or acute radiographic findings are seen and no supine evidence of free air. There are degenerative changes of the spine. No significant osseous findings. IMPRESSION: 1. Dobbhoff feeding tube terminates in the distal gastric antrum in good position. 2. Interstitial and faint patchy hazy opacities in the lower lung fields but significantly improved. Findings most likely indicating clearing edema and/or pneumonia. 3. Cardiomegaly. 4. Aeration throughout the intestinal tract, without pathologic dilatation. 5. TIPS stent in the liver. Electronically Signed   By: Almira Bar M.D.   On: 04/17/2023 00:07   DG Abd Portable 1V  Result Date: 04/16/2023 CLINICAL DATA:  Feeding tube placement EXAM: PORTABLE ABDOMEN - 1 VIEW limited for tube placement COMPARISON:  X-ray 04/15/2023 FINDINGS: Prior NG tube has been removed. New Dobbhoff tube with tip crossing to the right side of the abdomen, likely at the pylorus or proximal duodenum. Minimal slack in the stomach. Elsewhere note is made of a presumed tips shunt overlying  the liver. Gaseous distention of bowel elsewhere in the visualized abdomen IMPRESSION: Limited x-ray for tube placement has a Dobbhoff tube with tip to the right of the abdomen. Likely pylorus or proximal duodenum Electronically Signed   By: Karen Kays M.D.   On: 04/16/2023 11:30   DG Abd Portable 1V  Result Date: 04/15/2023 CLINICAL DATA:  Nasogastric tube placement EXAM: PORTABLE ABDOMEN - 1 VIEW COMPARISON:  Portable exam 1415 hours compared to 04/12/2023 FINDINGS: 2 nasogastric tubes are identified, projecting over mid stomach and distal stomach. Air-filled nondistended colon. TIPS shunt RIGHT upper quadrant. Minimal atelectasis or infiltrate LEFT base. IMPRESSION: Two nasogastric tubes project over stomach as above. Electronically Signed   By: Ulyses Southward M.D.   On: 04/15/2023 14:30    Microbiology: Recent Results (from the past 240 hour(s))  Urine Culture     Status: Abnormal   Collection Time: 05/05/23  1:31 AM   Specimen: Urine, Clean Catch  Result Value Ref Range Status   Specimen Description   Final    URINE, CLEAN CATCH Performed at [facility], [address] [place], [place] [address]    Special Requests   Final    NONE Reflexed from [id number] Performed at [facility], [address] [place], [place] [address]    Culture >=100,000 COLONIES/mL HAFNIA SPECIES (A)  Final   Report Status 05/07/2023 FINAL  Final   Organism ID, Bacteria HAFNIA SPECIES (A)  Final      Susceptibility   Hafnia species - MIC*    AMPICILLIN >=32 RESISTANT Resistant     CIPROFLOXACIN <=0.25 SENSITIVE Sensitive     GENTAMICIN <=1 SENSITIVE Sensitive     IMIPENEM <=0.25 SENSITIVE Sensitive     NITROFURANTOIN <=16 SENSITIVE Sensitive     TRIMETH/SULFA <=20 SENSITIVE Sensitive     AMPICILLIN/SULBACTAM >=32 RESISTANT Resistant     PIP/TAZO >=128 RESISTANT Resistant     * >=100,000 COLONIES/mL HAFNIA SPECIES  Culture, blood (Routine X 2) w Reflex to ID Panel     Status: None  (Preliminary result)   Collection Time: 05/09/23  9:45 PM   Specimen: BLOOD  Result Value Ref Range Status   Specimen Description BLOOD BLOOD LEFT WRIST  Final   Special Requests IN PEDIATRIC BOTTLE Blood Culture adequate volume  Final  Culture   Final    NO GROWTH 4 DAYS Performed at [facility], [address] [place], [place] [address]    Report Status PENDING  Incomplete  Culture, blood (Routine X 2) w Reflex to ID Panel     Status: None (Preliminary result)   Collection Time: 05/09/23  9:45 PM   Specimen: BLOOD  Result Value Ref Range Status   Specimen Description BLOOD LEFT ANTECUBITAL  Final   Special Requests   Final    BOTTLES DRAWN AEROBIC ONLY Blood Culture results may not be optimal due to an inadequate volume of blood received in culture bottles   Culture   Final    NO GROWTH 4 DAYS Performed at [facility], [address] [place], [place] [address]    Report Status PENDING  Incomplete     Labs: Basic Metabolic Panel: Recent Labs  Lab 05/09/23 1921 05/10/23 0559 05/11/23 0419 05/12/23 0450 05/13/23 0712  NA 132* 136 135 133* 137  K 3.5 3.3* 3.4* 3.3* 3.4*  CL 105 112* 111 112* 109  CO2 17* 17* 18* 16* 22  GLUCOSE 108* 93 88 118* 132*  BUN 9 11 11 6 7   CREATININE 0.57* 0.66 0.57* 0.55* 0.54*  CALCIUM 8.5* 8.4* 8.1* 8.3* 8.5*  MG  --  1.5*  --   --   --    Liver Function Tests: Recent Labs  Lab 05/09/23 1921 05/10/23 0559 05/11/23 0419 05/12/23 0450 05/13/23 0712  AST 96* 79* 61* 54* 48*  ALT 37 32 27 24 21   ALKPHOS 229* 200* 200* 204* 184*  BILITOT 4.5* 3.5* 3.2* 2.6* 2.5*  PROT 6.9 6.5 6.7 6.7 6.4*  ALBUMIN 2.6* 2.4* 2.6* 2.4* 2.3*   Recent Labs  Lab 05/09/23 1921  LIPASE 37   Recent Labs  Lab 05/09/23 0505  AMMONIA 58*   CBC: Recent Labs  Lab 05/07/23 0519 05/09/23 0505 05/09/23 1921 05/10/23 0559  WBC 5.1 7.3 10.0 7.0  NEUTROABS 2.8  --  8.3* 5.1  HGB 9.1* 9.8* 10.7* 9.6*  HCT 29.0* 30.5* 32.5*  29.6*  MCV 90.6 87.9 86.7 88.9  PLT 142* 106* 110* 102*   Cardiac Enzymes: No results for input(s): "CKTOTAL", "CKMB", "CKMBINDEX", "TROPONINI" in the last 168 hours. BNP: BNP (last 3 results) Recent Labs    01/20/23 0328 02/22/23 0450 05/05/23 0956  BNP 2,540.3* 207.2* 426.3*    ProBNP (last 3 results) No results for input(s): "PROBNP" in the last 8760 hours.  CBG: Recent Labs  Lab 05/11/23 2102 05/12/23 0755 05/12/23 1145 05/12/23 1718 05/12/23 2110  GLUCAP 132* 114* 128* 126* 148*       Signed:  Silvano Bilis MD.  [facility] 05/13/2023, 2:48 PM

## 2023-05-13 NOTE — Progress Notes (Addendum)
PROGRESS NOTE    Ethan Wells  GNF:[id number] DOB: Apr 29, 1971 DOA: 05/05/2023 PCP: [name], NP    Brief Narrative:  52 y.o. male with medical history significant of alcohol abuse, alcoholic liver cirrhosis with portal gastropathy, s/p of TIPS, superior mesenteric artery stenosis, HTN, HLd, CAD, STEMI, dCHF, GERD, depression with anxiety, adrenal insufficiency (Addison's disease), migraine headache, cardiac arrest due to V-fib, recent admission due to GI bleeding, cocaine abuse, tobacco abuse, who presents with chest pain, abdominal pain, rectal bleeding.   Patient was recently hospitalized from 4/22 - 5/13 due to upper GI bleeding. Pt is s/p of EGD with banding on 4/23 and s/p of TIPS procedure on 4/27. Pt states that he continues to use cocaine and drinking alcohol.  He states that he has chest pain since yesterday, which is located in front and right side of chest, intermittent, come and goes, morbid, both sharp and dull pain, nonradiating.  Associated with dry cough and mild shortness breath.  He also reports abdominal pain, which is diffuse, constant, aching, moderate, nonradiating.    Seen in consultation by vascular surgery and gastroenterology.  Gastroenterology not planning any endoluminal evaluation.  Vascular surgery is recommending angiography with possible reintervention of SMA and celiac.  Nonemergent.  Tentatively planned for Friday 5/23  Patient is significantly lethargic but continues to request IV narcotics.  High suspicion for drug-seeking behavior.   Assessment & Plan:   Principal Problem:   GI bleeding Active Problems:   Anemia due to GI blood loss   Adrenal insufficiency (Addison's disease) (HCC)   Alcoholic cirrhosis (HCC)   Abdominal pain   Mesenteric artery stenosis (HCC)   Chest pain   CAD (coronary artery disease)   UTI (urinary tract infection)   HLD (hyperlipidemia)   Chronic diastolic CHF (congestive heart failure) (HCC)    Hypomagnesemia   Hypokalemia   Essential hypertension   Depression with anxiety   Tobacco abuse   Cocaine abuse (HCC)   Polysubstance abuse (HCC)   Alcohol abuse   Cholecystitis, acute  Acute vs chronic cholecystitis Worsening abdominal pain and two episodes vomiting after vascular procedure on 5/24. CT suggestive of acute cholecystitis and ruq u/s confirms this. Started on abx, gen surg and IR consulted. Hemodynamically stable, no fever or white count, abd pain much improved. Blood cultures neg. Both teams decline intervention - on 5/27 transitioned to oral abx per gen surg recs (cipro/flagyl), plan for 10 day course, f/u  Dr. Everlene Farrier 2 wks  Ileus, partial Likely 2/2 above. Resolved, tolerating diet  Debility Discussed discharge with patient today, he said he feels too weak to discharge. His BPs are quite soft. PT previously advised home health. I do think there is some secondary gain to being in the hospital. - have asked PT to re-assess, if stable for home with home health would plan on that tomorrow. Patient has been refusing PT and so   GI bleeding and anemia due to GI blood loss Hemorrhoidal bleeding Hgb stable in the 9s.   GI evaluated patient, thinks likely hemorrhoidal.  No plans for endoscopic evaluation currently.Marland Kitchen admitted last month for upper gi bleed 2/2 variceal bleed treated with banding and tips.  Plan: Continue PPI for now Monitor hemoglobin Transfuse as needed less than 7 Anusol twice daily as needed  Adrenal insufficiency (Addison's disease) (HCC) -continue home Cortef 5 mg daily  Low back pain This is his primary compliant. Says it's chronic but worsening and now associated with gait dysfunction. MRI of lumbar  spine 5/22 shows small disc protrusions, no stenosis, mild foraminal stenosis.   Alcoholic cirrhosis (HCC):  Status post TIPS INR 1.5, ammonia 59, mental status poor but likely attributed to narcotics Plan: -Lactulose 20 g 3 times daily, increased to  30 tid -Rifaximin -Hold Lasix and spironolactone due to soft blood pressure, restart as appropriate   Constipation Resolved, stools not accurately charted   Abdominal pain and superior mesenteric artery stenosis  Etiology for his abdominal pain is not clear.  Potential differential diagnosis include superior mesenteric artery stenosis, SBP, UTI.  CT scan showed distended gallbladder, but US-RUQ has no evidence of cholecystitis.  Patient does not have fever or leukocytosis.  Does not seem to have acute cholecystitis.  Patient has history of superior mesenteric artery stenosis, CT scan also showed some abnormalities in the SMA.  Vascular performed percutaneous procedure 5/24 with several stents to the SMA. - will re-start plavix given no plan for surgical intervention - cont statin  Hypokalemia Hypomagenesmia Repleting today   HLD (hyperlipidemia) -Crestor   Chronic diastolic CHF (congestive heart failure) (HCC)  2D echo on 04/11/2023 showed EF of 55-60% with grade 2 diastolic dysfunction.  Patient mild plus leg edema, elevated BNP 426, but no oxygen desaturation.  Chest x-ray negative for pulmonary edema.  Does not seem to have acute CHF exacerbation, but patient had a high risk of developing CHF exacerbation.   -Hold Lasix and spironolactone as above  Essential hypertension Blood pressure soft with maps in upper 60s - continue to Hold all blood pressure medications and diuretics due to soft blood pressure -Midodrine 10 mg 3 times daily, wean as tolerated   Depression with anxiety -Continue home medications   Polysubstance abuse Cocaine, alcohol, tobacco -Did counseling about importance of quitting substance use -Nicotine patch -CIWA protocol, no signs withdrawal - advised patient no opioids on discharge   DVT prophylaxis: SCDs Code Status: Full Family Communication: None Disposition Plan: Status is: Inpatient Remains inpatient appropriate because: PT re-eval  today   Level of care: Med-Surg  Consultants:  GI Vascular Gen surg  Procedures:  Vascular intervention  Antimicrobials: See above   Subjective: Seen and examined.  Abd pain resolved. Tolerating diet. Says feels very weak.  Objective: Vitals:   05/13/23 0434 05/13/23 0733 05/13/23 0735 05/13/23 0843  BP: (!) 88/48 (!) 88/55 (!) 103/54 (!) 100/54  Pulse: 87   87  Resp: 18   18  Temp: 98.2 F (36.8 C)   98.4 F (36.9 C)  TempSrc: Oral   Oral  SpO2: 98%   98%  Weight: 84.5 kg     Height:        Intake/Output Summary (Last 24 hours) at 05/13/2023 1121 Last data filed at 05/13/2023 1048 Gross per 24 hour  Intake 960 ml  Output 0 ml  Net 960 ml   Filed Weights   05/11/23 0500 05/12/23 0429 05/13/23 0434  Weight: 83.1 kg 82.6 kg 84.5 kg    Examination:  General exam: Lethargic. Respiratory system: Bibasilar crackles.  Normal work of breathing.  Room air Cardiovascular system: S1-S2, RRR, no murmurs, no pedal edema Gastrointestinal system: Soft, mildly distended, normal bowel sounds, diffuse mild generalized tenderness Central nervous system: Alert but lethargic.  Umoving all 4 Back: no midline point tenderness Extremities: Unable to assess power.  Gait not assessed Skin: No rashes, lesions or ulcers Psychiatry: flat affect    Data Reviewed: I have personally reviewed following labs and imaging studies  CBC: Recent Labs  Lab 05/07/23  4098 05/09/23 0505 05/09/23 1921 05/10/23 0559  WBC 5.1 7.3 10.0 7.0  NEUTROABS 2.8  --  8.3* 5.1  HGB 9.1* 9.8* 10.7* 9.6*  HCT 29.0* 30.5* 32.5* 29.6*  MCV 90.6 87.9 86.7 88.9  PLT 142* 106* 110* 102*   Basic Metabolic Panel: Recent Labs  Lab 05/09/23 1921 05/10/23 0559 05/11/23 0419 05/12/23 0450 05/13/23 0712  NA 132* 136 135 133* 137  K 3.5 3.3* 3.4* 3.3* 3.4*  CL 105 112* 111 112* 109  CO2 17* 17* 18* 16* 22  GLUCOSE 108* 93 88 118* 132*  BUN 9 11 11 6 7   CREATININE 0.57* 0.66 0.57* 0.55* 0.54*   CALCIUM 8.5* 8.4* 8.1* 8.3* 8.5*  MG  --  1.5*  --   --   --    GFR: Estimated Creatinine Clearance: 110.5 mL/min (A) (by C-G formula based on SCr of 0.54 mg/dL (L)). Liver Function Tests: Recent Labs  Lab 05/09/23 1921 05/10/23 0559 05/11/23 0419 05/12/23 0450 05/13/23 0712  AST 96* 79* 61* 54* 48*  ALT 37 32 27 24 21   ALKPHOS 229* 200* 200* 204* 184*  BILITOT 4.5* 3.5* 3.2* 2.6* 2.5*  PROT 6.9 6.5 6.7 6.7 6.4*  ALBUMIN 2.6* 2.4* 2.6* 2.4* 2.3*   Recent Labs  Lab 05/09/23 1921  LIPASE 37   Recent Labs  Lab 05/09/23 0505  AMMONIA 58*   Coagulation Profile: Recent Labs  Lab 05/09/23 0505 05/10/23 0559  INR 1.5* 1.6*   Cardiac Enzymes: No results for input(s): "CKTOTAL", "CKMB", "CKMBINDEX", "TROPONINI" in the last 168 hours. BNP (last 3 results) No results for input(s): "PROBNP" in the last 8760 hours. HbA1C: No results for input(s): "HGBA1C" in the last 72 hours.  CBG: Recent Labs  Lab 05/11/23 2102 05/12/23 0755 05/12/23 1145 05/12/23 1718 05/12/23 2110  GLUCAP 132* 114* 128* 126* 148*   Lipid Profile: No results for input(s): "CHOL", "HDL", "LDLCALC", "TRIG", "CHOLHDL", "LDLDIRECT" in the last 72 hours.  Thyroid Function Tests: No results for input(s): "TSH", "T4TOTAL", "FREET4", "T3FREE", "THYROIDAB" in the last 72 hours. Anemia Panel: No results for input(s): "VITAMINB12", "FOLATE", "FERRITIN", "TIBC", "IRON", "RETICCTPCT" in the last 72 hours. Sepsis Labs: Recent Labs  Lab 05/09/23 1917 05/09/23 2145  LATICACIDVEN 1.6 1.9    Recent Results (from the past 240 hour(s))  Urine Culture     Status: Abnormal   Collection Time: 05/05/23  1:31 AM   Specimen: Urine, Clean Catch  Result Value Ref Range Status   Specimen Description   Final    URINE, CLEAN CATCH Performed at [facility], [address] [place], [place] [address]    Special Requests   Final    NONE Reflexed from [id number] Performed at [facility], [address] [place], [place] [address]    Culture >=100,000 COLONIES/mL HAFNIA SPECIES (A)  Final   Report Status 05/07/2023 FINAL  Final   Organism ID, Bacteria HAFNIA SPECIES (A)  Final      Susceptibility   Hafnia species - MIC*    AMPICILLIN >=32 RESISTANT Resistant     CIPROFLOXACIN <=0.25 SENSITIVE Sensitive     GENTAMICIN <=1 SENSITIVE Sensitive     IMIPENEM <=0.25 SENSITIVE Sensitive     NITROFURANTOIN <=16 SENSITIVE Sensitive     TRIMETH/SULFA <=20 SENSITIVE Sensitive     AMPICILLIN/SULBACTAM >=32 RESISTANT Resistant     PIP/TAZO >=128 RESISTANT Resistant     * >=100,000 COLONIES/mL HAFNIA SPECIES  Culture, blood (Routine X 2) w Reflex to  ID Panel     Status: None (Preliminary result)   Collection Time: 05/09/23  9:45 PM   Specimen: BLOOD  Result Value Ref Range Status   Specimen Description BLOOD BLOOD LEFT WRIST  Final   Special Requests IN PEDIATRIC BOTTLE Blood Culture adequate volume  Final   Culture   Final    NO GROWTH 4 DAYS Performed at [facility], [address] [place], [place] [address]    Report Status PENDING  Incomplete  Culture, blood (Routine X 2) w Reflex to ID Panel     Status: None (Preliminary result)   Collection Time: 05/09/23  9:45 PM   Specimen: BLOOD  Result Value Ref Range Status   Specimen Description BLOOD LEFT ANTECUBITAL  Final   Special Requests   Final    BOTTLES DRAWN AEROBIC ONLY Blood Culture results may not be optimal due to an inadequate volume of blood received in culture bottles   Culture   Final    NO GROWTH 4 DAYS Performed at [facility], [address], [place], [place] [address]    Report Status PENDING  Incomplete         Radiology Studies: No results found.      Scheduled Meds:  ciprofloxacin  500 mg Oral BID   clopidogrel  75 mg Oral Daily   folic acid  1 mg Oral Daily   gabapentin  100 mg Oral BID   hydrocortisone  10 mg Oral Daily   hydrocortisone  5 mg Oral QPC lunch   lactulose  30  g Oral TID   lidocaine  1 patch Transdermal Q24H   metroNIDAZOLE  500 mg Oral Q12H   midodrine  10 mg Oral TID WC   multivitamin with minerals  1 tablet Oral Daily   nicotine  21 mg Transdermal Daily   pantoprazole  40 mg Oral Daily   QUEtiapine  50 mg Oral QHS   rifaximin  550 mg Oral BID   rosuvastatin  40 mg Oral QHS   sodium chloride flush  10-40 mL Intracatheter Q12H   sodium chloride flush  3 mL Intravenous Q12H   thiamine  100 mg Oral Daily   Continuous Infusions:  sodium chloride 250 mL (05/12/23 0710)      LOS: 8 days     Silvano Bilis, MD [facility]   If 7PM-7AM, please contact night-coverage  05/13/2023, 11:21 AM

## 2023-05-13 NOTE — TOC Transition Note (Signed)
Transition of Care [facility]) - CM/SW Discharge Note   Patient Details  Name: Ethan SULLO MRN: [id number] Date of Birth: May 03, 1971  Transition of Care [facility]) CM/SW Contact:  Margarito Liner, LCSW Phone Number: 05/13/2023, 3:57 PM   Clinical Narrative:  Patient has orders to discharge home today. Patient will go to his mother's house to get his truck. [facility] set up to get him to [address], [place], [place] [address]. Rider waiver signe and placed in chart. No further concerns. CSW signing off.   Final next level of care: Home/Self Care Barriers to Discharge: Barriers Resolved   Patient Goals and CMS Choice CMS Medicare.gov Compare Post Acute Care list provided to:: Patient Choice offered to / list presented to : Patient  Discharge Placement                  Patient to be transferred to facility by: [facility]   Patient and family notified of of transfer: 05/13/23  Discharge Plan and Services Additional resources added to the After Visit Summary for                                       Social Determinants of Health (SDOH) Interventions SDOH Screenings   Food Insecurity: No Food Insecurity (04/27/2023)  Housing: Patient Unable To Answer (04/27/2023)  Transportation Needs: No Transportation Needs (04/27/2023)  Utilities: Not At Risk (04/27/2023)  Alcohol Screen: Medium Risk (10/01/2022)  Tobacco Use: High Risk (05/13/2023)     Readmission Risk Interventions    02/24/2023    3:12 PM 01/17/2023    3:38 PM 10/22/2022   10:47 AM  Readmission Risk Prevention Plan  Transportation Screening Complete Complete Complete  PCP or Specialist Appt within 3-5 Days   Complete  Social Work Consult for Recovery Care Planning/Counseling   Complete  Palliative Care Screening   Not Applicable  Medication Review Oceanographer) Complete Complete Complete  SW Recovery Care/Counseling Consult  Not Complete   SW Consult Not Complete Comments  lethargic   Palliative  Care Screening Not Applicable Not Applicable   Skilled Nursing Facility Not Applicable Not Applicable

## 2023-05-13 NOTE — Progress Notes (Signed)
PT Cancellation Note  Patient Details Name: Ethan Wells MRN: [id number] DOB: Sep 14, 1971   Cancelled Treatment:    Reason Eval/Treat Not Completed: Other (comment). Attempted twice today, pt refusing mobility. Second attempt with charge RN to encourage pt participation. He did sit up EOB without assistance but sat for <5 seconds, returned to supine and refused any further attempts despite education and encouragement, stated he will be going home tomorrow. PT to re-attempt as able.   Olga Coaster PT, DPT 1:32 PM,05/13/23

## 2023-05-13 NOTE — Progress Notes (Signed)
Mobility Specialist - Progress Note   05/13/23 0937  Mobility  Activity Stood at bedside  Level of Assistance Minimal assist, patient does 75% or more  Assistive Device Front wheel walker  Distance Ambulated (ft) 2 ft  $Mobility charge 1 Mobility  Mobility Specialist Start Time (ACUTE ONLY) [id number]  Mobility Specialist Stop Time (ACUTE ONLY) 0935  Mobility Specialist Time Calculation (min) (ACUTE ONLY) 8 min   Pt supine upon entry, utilizing RA. Pt denied OOB amb within the room or out in the hallway this date, stating "I'm tried and don't want to do anything". MS educated Pt on the importance of OOB mobility. Pt still denied, however agreeable to stand at bedside. Pt completed bed mob indep, and STS to RW MinA. Pt stood at bedside w/ supervision, took a singular step forward and backward-- constantly expressing lack of energy and effort during the session. Pt returned supine, left with needs within reach.   Zetta Bills Mobility Specialist 05/13/23 9:46 AM

## 2023-05-14 ENCOUNTER — Telehealth: Payer: Self-pay

## 2023-05-14 LAB — CULTURE, BLOOD (ROUTINE X 2)
Culture: NO GROWTH
Special Requests: ADEQUATE

## 2023-05-14 NOTE — Transitions of Care (Post Inpatient/ED Visit) (Signed)
   05/14/2023  Name: Ethan Wells MRN: [id number] DOB: [birth year]  Today's TOC FU Call Status: Today's TOC FU Call Status:: Unsuccessul Call (1st Attempt) Unsuccessful Call (1st Attempt) Date: 05/14/23  Attempted to reach the patient regarding the most recent Inpatient/ED visit.  Follow Up Plan: Additional outreach attempts will be made to reach the patient to complete the Transitions of Care (Post Inpatient/ED visit) call.     Antionette Fairy, RN,BSN,CCM [facility] Care Management Care Management Community Coordinator Direct Phone: [phone] Toll Free: [phone] Fax: [phone]

## 2023-05-15 ENCOUNTER — Telehealth: Payer: Self-pay

## 2023-05-15 NOTE — Transitions of Care (Post Inpatient/ED Visit) (Signed)
   05/15/2023  Name: Ethan Wells MRN: [id number] DOB: 06/06/1971  Today's TOC FU Call Status: Today's TOC FU Call Status:: Unsuccessful Call (2nd Attempt) Unsuccessful Call (2nd Attempt) Date: 05/15/23  Attempted to reach the patient regarding the most recent Inpatient/ED visit.  Follow Up Plan: Additional outreach attempts will be made to reach the patient to complete the Transitions of Care (Post Inpatient/ED visit) call.     Antionette Fairy, RN,BSN,CCM [facility] Care Management Care Management Community Coordinator Direct Phone: [phone] Toll Free: [phone] Fax: [phone]

## 2023-05-15 NOTE — Transitions of Care (Post Inpatient/ED Visit) (Signed)
   05/15/2023  Name: CARLEY COCKERELL MRN: [id number] DOB: December 25, 1970  Today's TOC FU Call Status: Today's TOC FU Call Status:: Unsuccessful Call (3rd Attempt) Unsuccessful Call (3rd Attempt) Date: 05/15/23  Attempted to reach the patient regarding the most recent Inpatient/ED visit.  Follow Up Plan: No further outreach attempts will be made at this time. We have been unable to contact the patient.   Antionette Fairy, RN,BSN,CCM [facility] Care Management Care Management Community Coordinator Direct Phone: [phone] Toll Free: [phone] Fax: [phone]

## 2023-05-19 ENCOUNTER — Telehealth: Payer: Self-pay

## 2023-05-19 NOTE — Patient Outreach (Signed)
  Care Coordination   05/19/2023 Name: Ethan Wells MRN: [id number] DOB: 1971-05-12   Care Coordination Outreach Attempts:  An unsuccessful telephone outreach was attempted today to offer the patient information about available care coordination services.  Follow Up Plan:  Additional outreach attempts will be made to offer the patient care coordination information and services.   Encounter Outcome:  No Answer   Care Coordination Interventions:  No, not indicated    Bevelyn Ngo, BSW, CDP Social Worker, Certified Dementia Practitioner [facility] Care Management  Care Coordination [phone]

## 2023-05-21 ENCOUNTER — Telehealth: Payer: Self-pay

## 2023-05-21 NOTE — Patient Outreach (Signed)
  Care Coordination   05/21/2023 Name: Ethan Wells MRN: [id number] DOB: 08-Jul-1971   Care Coordination Outreach Attempts:  A second unsuccessful outreach was attempted today to offer the patient with information about available care coordination services.  Follow Up Plan:  Additional outreach attempts will be made to offer the patient care coordination information and services.   Encounter Outcome:  No Answer   Care Coordination Interventions:  No, not indicated    Bevelyn Ngo, BSW, CDP Social Worker, Certified Dementia Practitioner [facility] Care Management  Care Coordination [phone]

## 2023-05-22 ENCOUNTER — Encounter: Payer: Medicare HMO | Admitting: Adult Health

## 2023-05-22 NOTE — Progress Notes (Signed)
This encounter was created in error - please disregard.

## 2023-05-23 ENCOUNTER — Telehealth: Payer: Self-pay

## 2023-05-23 NOTE — Patient Outreach (Signed)
  Care Coordination   05/23/2023 Name: Ethan Wells MRN: [id number] DOB: 26-Jul-1971   Care Coordination Outreach Attempts:  A third unsuccessful outreach was attempted today to offer the patient with information about available care coordination services.  Follow Up Plan:  No further outreach attempts will be made at this time. We have been unable to contact the patient to offer or enroll patient in care coordination services  Encounter Outcome:  No Answer   Care Coordination Interventions:  No, not indicated    Bevelyn Ngo, BSW, CDP Social Worker, Certified Dementia Practitioner [facility] Care Management  Care Coordination [phone]

## 2023-05-28 ENCOUNTER — Inpatient Hospital Stay: Payer: Medicare HMO | Admitting: Surgery

## 2023-05-30 ENCOUNTER — Emergency Department: Payer: Medicare HMO

## 2023-05-30 ENCOUNTER — Emergency Department
Admission: EM | Admit: 2023-05-30 | Discharge: 2023-05-30 | Disposition: A | Discharge status: Home / Self Care | Payer: Medicare HMO | Attending: Student in an Organized Health Care Education/Training Program | Admitting: Student in an Organized Health Care Education/Training Program

## 2023-05-30 ENCOUNTER — Other Ambulatory Visit: Payer: Self-pay

## 2023-05-30 DIAGNOSIS — F1010 Alcohol abuse, uncomplicated: Secondary | ICD-10-CM | POA: Insufficient documentation

## 2023-05-30 DIAGNOSIS — F1910 Other psychoactive substance abuse, uncomplicated: Secondary | ICD-10-CM | POA: Insufficient documentation

## 2023-05-30 DIAGNOSIS — I491 Atrial premature depolarization: Secondary | ICD-10-CM | POA: Diagnosis not present

## 2023-05-30 DIAGNOSIS — R000 Tachycardia, unspecified: Secondary | ICD-10-CM | POA: Diagnosis not present

## 2023-05-30 DIAGNOSIS — R0789 Other chest pain: Secondary | ICD-10-CM | POA: Diagnosis not present

## 2023-05-30 DIAGNOSIS — R079 Chest pain, unspecified: Secondary | ICD-10-CM | POA: Diagnosis not present

## 2023-05-30 DIAGNOSIS — R1011 Right upper quadrant pain: Secondary | ICD-10-CM | POA: Diagnosis not present

## 2023-05-30 DIAGNOSIS — K7460 Unspecified cirrhosis of liver: Secondary | ICD-10-CM | POA: Insufficient documentation

## 2023-05-30 DIAGNOSIS — I2510 Atherosclerotic heart disease of native coronary artery without angina pectoris: Secondary | ICD-10-CM | POA: Insufficient documentation

## 2023-05-30 LAB — CBC
HCT: 32 % — ABNORMAL LOW (ref 39.0–52.0)
Hemoglobin: 10.5 g/dL — ABNORMAL LOW (ref 13.0–17.0)
MCH: 28.8 pg (ref 26.0–34.0)
MCHC: 32.8 g/dL (ref 30.0–36.0)
MCV: 87.9 fL (ref 80.0–100.0)
Platelets: 154 10*3/uL (ref 150–400)
RBC: 3.64 MIL/uL — ABNORMAL LOW (ref 4.22–5.81)
RDW: 18.3 % — ABNORMAL HIGH (ref 11.5–15.5)
WBC: 6.7 10*3/uL (ref 4.0–10.5)
nRBC: 0 % (ref 0.0–0.2)

## 2023-05-30 LAB — URINE DRUG SCREEN, QUALITATIVE (ARMC ONLY)
Amphetamines, Ur Screen: NOT DETECTED
Barbiturates, Ur Screen: NOT DETECTED
Benzodiazepine, Ur Scrn: NOT DETECTED
Cannabinoid 50 Ng, Ur ~~LOC~~: NOT DETECTED
Cocaine Metabolite,Ur ~~LOC~~: NOT DETECTED
MDMA (Ecstasy)Ur Screen: NOT DETECTED
Methadone Scn, Ur: NOT DETECTED
Opiate, Ur Screen: NOT DETECTED
Phencyclidine (PCP) Ur S: NOT DETECTED
Tricyclic, Ur Screen: NOT DETECTED

## 2023-05-30 LAB — HEPATIC FUNCTION PANEL
ALT: 20 U/L (ref 0–44)
AST: 69 U/L — ABNORMAL HIGH (ref 15–41)
Albumin: 3.2 g/dL — ABNORMAL LOW (ref 3.5–5.0)
Alkaline Phosphatase: 211 U/L — ABNORMAL HIGH (ref 38–126)
Bilirubin, Direct: 1.4 mg/dL — ABNORMAL HIGH (ref 0.0–0.2)
Indirect Bilirubin: 3.4 mg/dL — ABNORMAL HIGH (ref 0.3–0.9)
Total Bilirubin: 4.8 mg/dL — ABNORMAL HIGH (ref 0.3–1.2)
Total Protein: 7.6 g/dL (ref 6.5–8.1)

## 2023-05-30 LAB — TROPONIN I (HIGH SENSITIVITY)
Troponin I (High Sensitivity): 20 ng/L — ABNORMAL HIGH (ref <18)
Troponin I (High Sensitivity): 18 ng/L — ABNORMAL HIGH (ref <18)

## 2023-05-30 LAB — BASIC METABOLIC PANEL
Anion gap: 10 (ref 5–15)
BUN: 5 mg/dL — ABNORMAL LOW (ref 6–20)
CO2: 17 mmol/L — ABNORMAL LOW (ref 22–32)
Calcium: 8.8 mg/dL — ABNORMAL LOW (ref 8.9–10.3)
Chloride: 109 mmol/L (ref 98–111)
Creatinine, Ser: 0.69 mg/dL (ref 0.61–1.24)
GFR, Estimated: >60 mL/min (ref >60)
Glucose, Bld: 119 mg/dL — ABNORMAL HIGH (ref 70–99)
Potassium: 2.7 mmol/L — CL (ref 3.5–5.1)
Sodium: 136 mmol/L (ref 135–145)

## 2023-05-30 LAB — LIPASE, BLOOD: Lipase: 31 U/L (ref 11–51)

## 2023-05-30 MED ORDER — POTASSIUM CHLORIDE 20 MEQ PO PACK
80.0000 meq | PACK | Freq: Every day | ORAL | Status: DC
  Administered 2023-05-30: 80 meq via ORAL
  Filled 2023-05-30: qty 4

## 2023-05-30 MED ORDER — SODIUM CHLORIDE 0.9 % IV BOLUS
500.0000 mL | Freq: Once | INTRAVENOUS | Status: AC
  Administered 2023-05-30: 500 mL via INTRAVENOUS

## 2023-05-30 MED ORDER — ALUM & MAG HYDROXIDE-SIMETH 200-200-20 MG/5ML PO SUSP
30.0000 mL | Freq: Once | ORAL | Status: AC
  Administered 2023-05-30: 30 mL via ORAL
  Filled 2023-05-30: qty 30

## 2023-05-30 MED ORDER — OXYCODONE HCL 5 MG PO TABS
10.0000 mg | ORAL_TABLET | Freq: Once | ORAL | Status: AC
  Administered 2023-05-30: 10 mg via ORAL
  Filled 2023-05-30: qty 2

## 2023-05-30 NOTE — ED Notes (Signed)
First Nurse Note: Patient to ED via [facility] from home for CP x3 days. Pain radiates into back at times and describes pain as pressure. Hx of alcohol abuse. Given 1 spray of nitroglycerin with EMS. 20 L forearm.  EMS VS:  102 HR 114/70

## 2023-05-30 NOTE — ED Triage Notes (Signed)
Pt to ed from home via [facility] for CP. Pt was just seen for same. Pt is caox4, in no acute distress and ambulatory.

## 2023-05-30 NOTE — ED Provider Notes (Signed)
[facility] Provider Note    Event Date/Time   First MD Initiated Contact with Patient 05/30/23 1745     (approximate)   History   Chest Pain (X 3 days)   HPI  Ethan Wells is a 52 y.o. 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  Symptoms been going on for about a week.  Denies any pain radiating through to his back.  No shortness of breath.     Physical Exam   Triage Vital Signs: ED Triage Vitals [05/30/23 1624]  Enc Vitals Group     BP 117/70     Pulse Rate 100     Resp 16     Temp 98 F (36.7 C)     Temp Source Oral     SpO2 98 %     Weight 185 lb 3 oz (84 kg)     Height 5\' 5"  (1.651 m)     Head Circumference      Peak Flow      Pain Score 5     Pain Loc      Pain Edu?      Excl. in GC?     Most recent vital signs: Vitals:   05/30/23 1624 05/30/23 2220  BP: 117/70 (!) 145/82  Pulse: 100 90  Resp: 16 20  Temp: 98 F (36.7 C) 98.3 F (36.8 C)  SpO2: 98% 97%     Constitutional: Alert  Eyes: Conjunctivae are normal.  Head: Atraumatic. Nose: No congestion/rhinnorhea. Mouth/Throat: Mucous membranes are moist.   Neck: Painless ROM.  Cardiovascular:   Good peripheral circulation.no m/g/r Respiratory: Normal respiratory effort.  No retractions.  Gastrointestinal: Soft with mild ttp in ruq.  Musculoskeletal:  no deformity Neurologic:  MAE spontaneously. No gross focal neurologic deficits are appreciated.  Skin:  Skin is warm, dry and intact. No rash noted. Psychiatric: Mood and affect are normal. Speech and behavior are normal.    ED Results / Procedures / Treatments   Labs (all labs ordered are listed, but only abnormal results are displayed) Labs Reviewed  BASIC METABOLIC PANEL - Abnormal; Notable for the  following components:      Result Value   Potassium 2.7 (*)    CO2 17 (*)    Glucose, Bld 119 (*)    BUN 5 (*)    Calcium 8.8 (*)    All other components within normal limits  CBC - Abnormal; Notable for the following components:   RBC 3.64 (*)    Hemoglobin 10.5 (*)    HCT 32.0 (*)    RDW 18.3 (*)    All other components within normal limits  HEPATIC FUNCTION PANEL - Abnormal; Notable for the following components:   Albumin 3.2 (*)    AST 69 (*)    Alkaline Phosphatase 211 (*)    Total Bilirubin 4.8 (*)    Bilirubin, Direct 1.4 (*)    Indirect Bilirubin 3.4 (*)    All other components within normal limits  TROPONIN I (HIGH SENSITIVITY) - Abnormal; Notable for the following components:   Troponin I (High Sensitivity) 20 (*)    All other components within normal limits  TROPONIN I (HIGH SENSITIVITY) - Abnormal; Notable for the following components:   Troponin I (High Sensitivity) 18 (*)    All other  components within normal limits  LIPASE, BLOOD  URINE DRUG SCREEN, QUALITATIVE ([facility] ONLY)     EKG  ED ECG REPORT I, Willy Eddy, the attending physician, personally viewed and interpreted this ECG.   Date: 05/30/2023  EKG Time: 16:27  Rate: 99  Rhythm: sinus  Axis: normal  Intervals: normal  ST&T Change: non specific st abn, no stemi    RADIOLOGY Please see ED Course for my review and interpretation.  I personally reviewed all radiographic images ordered to evaluate for the above acute complaints and reviewed radiology reports and findings.  These findings were personally discussed with the patient.  Please see medical record for radiology report.    PROCEDURES:  Critical Care performed: No  Procedures   MEDICATIONS ORDERED IN ED: Medications  potassium chloride (KLOR-CON) packet 80 mEq (80 mEq Oral Given 05/30/23 1950)  oxyCODONE (Oxy IR/ROXICODONE) immediate release tablet 10 mg (10 mg Oral Given 05/30/23 1831)  alum & mag hydroxide-simeth  (MAALOX/MYLANTA) 200-200-20 MG/5ML suspension 30 mL (30 mLs Oral Given 05/30/23 1830)  sodium chloride 0.9 % bolus 500 mL (0 mLs Intravenous Stopped 05/30/23 2215)     IMPRESSION / MDM / ASSESSMENT AND PLAN / ED COURSE  I reviewed the triage vital signs and the nursing notes.                              Differential diagnosis includes, but is not limited to, ACS, pericarditis, esophagitis, boerhaaves, pe, dissection, cholelithiasis, cholecystitis, pna, bronchitis, costochondritis  Patient presenting to the ER for evaluation of symptoms as described above.  Based on symptoms, risk factors and considered above differential, this presenting complaint could reflect a potentially life-threatening illness therefore the patient will be placed on continuous pulse oximetry and telemetry for monitoring.  Laboratory evaluation will be sent to evaluate for the above complaints.     Clinical Course as of 05/30/23 2325  Fri May 30, 2023  2208 Troponins flat.  Not consistent with ACS.  Patient tolerating p.o. despite saying that he cannot keep anything down.  Has been in the hall has not had 1 episode of emesis.  He has been observed drinking several times.  He does admit to continued alcohol use does not appear to be in active withdrawal.  His abdominal exam is soft benign.  No leukocytosis.  Hemoglobin is stable as compared to previous.  His ultrasound does not show any evidence of acute cholecystitis or ascites.  Is not consistent with SBP.  Does not appear consistent with mesenteric ischemia or acute intra-abdominal process.  Do not feel that further diagnostic testing clinically indicated at this time. [PR]    Clinical Course User Index [PR] Willy Eddy, MD     FINAL CLINICAL IMPRESSION(S) / ED DIAGNOSES   Final diagnoses:  Atypical chest pain     Rx / DC Orders   ED Discharge Orders     None        Note:  This document was prepared using Dragon voice recognition software and may  include unintentional dictation errors.    Willy Eddy, MD 05/30/23 2325

## 2023-05-30 NOTE — ED Notes (Signed)
Critical Result: Potassium 2.7  Siadecki, MD aware

## 2023-06-01 ENCOUNTER — Other Ambulatory Visit: Payer: Self-pay | Admitting: Adult Health

## 2023-06-01 DIAGNOSIS — I10 Essential (primary) hypertension: Secondary | ICD-10-CM

## 2023-06-04 ENCOUNTER — Telehealth: Payer: Self-pay

## 2023-06-04 NOTE — Telephone Encounter (Signed)
Transition Care Management Unsuccessful Follow-up Telephone Call  Date of discharge and from where:  [place] 6/14  Attempts:  1st Attempt  Reason for unsuccessful TCM follow-up call:  Unable to leave message   Lenard Forth [facility] Guide, [facility] [phone] [address] [place], [place], [place] [address] Phone: [phone] Email: [email or web address]

## 2023-06-05 ENCOUNTER — Telehealth: Payer: Self-pay

## 2023-06-05 NOTE — Telephone Encounter (Signed)
Transition Care Management Unsuccessful Follow-up Telephone Call  Date of discharge and from where:  [place] 6/14  Attempts:  2nd Attempt  Reason for unsuccessful TCM follow-up call:  Unable to leave message   Arlet Marter [facility] Guide, [place] [phone] [address], [place], [place] [address] Phone: [phone] Email: [email or web address]       

## 2023-06-09 ENCOUNTER — Other Ambulatory Visit: Payer: Self-pay

## 2023-06-09 MED ORDER — METHOCARBAMOL 750 MG PO TABS: 750.0000 mg | ORAL_TABLET | Freq: Two times a day (BID) | ORAL | 0 refills | Status: DC | PRN

## 2023-06-09 NOTE — Telephone Encounter (Signed)
Patient called to request a rx for methocarbamol 750 mg and pharmacy on file is correct.

## 2023-06-30 ENCOUNTER — Inpatient Hospital Stay
Admission: EM | Admit: 2023-06-30 | Discharge: 2023-07-05 | DRG: 392 | Disposition: A | Discharge status: Home / Self Care | Payer: Medicare HMO | Attending: Internal Medicine | Admitting: Internal Medicine

## 2023-06-30 ENCOUNTER — Other Ambulatory Visit: Payer: Self-pay

## 2023-06-30 ENCOUNTER — Emergency Department: Payer: Medicare HMO

## 2023-06-30 ENCOUNTER — Observation Stay: Payer: Medicare HMO

## 2023-06-30 DIAGNOSIS — F17210 Nicotine dependence, cigarettes, uncomplicated: Secondary | ICD-10-CM | POA: Diagnosis present

## 2023-06-30 DIAGNOSIS — I2510 Atherosclerotic heart disease of native coronary artery without angina pectoris: Secondary | ICD-10-CM | POA: Diagnosis not present

## 2023-06-30 DIAGNOSIS — F1910 Other psychoactive substance abuse, uncomplicated: Secondary | ICD-10-CM | POA: Diagnosis not present

## 2023-06-30 DIAGNOSIS — Y908 Blood alcohol level of 240 mg/100 ml or more: Secondary | ICD-10-CM | POA: Diagnosis present

## 2023-06-30 DIAGNOSIS — K7689 Other specified diseases of liver: Secondary | ICD-10-CM | POA: Diagnosis not present

## 2023-06-30 DIAGNOSIS — F419 Anxiety disorder, unspecified: Secondary | ICD-10-CM | POA: Diagnosis present

## 2023-06-30 DIAGNOSIS — F10929 Alcohol use, unspecified with intoxication, unspecified: Secondary | ICD-10-CM | POA: Diagnosis not present

## 2023-06-30 DIAGNOSIS — E271 Primary adrenocortical insufficiency: Secondary | ICD-10-CM | POA: Diagnosis not present

## 2023-06-30 DIAGNOSIS — Z8719 Personal history of other diseases of the digestive system: Secondary | ICD-10-CM

## 2023-06-30 DIAGNOSIS — I255 Ischemic cardiomyopathy: Secondary | ICD-10-CM | POA: Diagnosis present

## 2023-06-30 DIAGNOSIS — R161 Splenomegaly, not elsewhere classified: Secondary | ICD-10-CM | POA: Diagnosis not present

## 2023-06-30 DIAGNOSIS — R1084 Generalized abdominal pain: Secondary | ICD-10-CM | POA: Diagnosis not present

## 2023-06-30 DIAGNOSIS — I110 Hypertensive heart disease with heart failure: Secondary | ICD-10-CM | POA: Diagnosis present

## 2023-06-30 DIAGNOSIS — E785 Hyperlipidemia, unspecified: Secondary | ICD-10-CM | POA: Diagnosis present

## 2023-06-30 DIAGNOSIS — I10 Essential (primary) hypertension: Secondary | ICD-10-CM

## 2023-06-30 DIAGNOSIS — Z8249 Family history of ischemic heart disease and other diseases of the circulatory system: Secondary | ICD-10-CM | POA: Diagnosis not present

## 2023-06-30 DIAGNOSIS — G319 Degenerative disease of nervous system, unspecified: Secondary | ICD-10-CM | POA: Diagnosis not present

## 2023-06-30 DIAGNOSIS — E872 Acidosis, unspecified: Secondary | ICD-10-CM | POA: Diagnosis present

## 2023-06-30 DIAGNOSIS — F32A Depression, unspecified: Secondary | ICD-10-CM | POA: Diagnosis not present

## 2023-06-30 DIAGNOSIS — K7460 Unspecified cirrhosis of liver: Secondary | ICD-10-CM | POA: Diagnosis not present

## 2023-06-30 DIAGNOSIS — I252 Old myocardial infarction: Secondary | ICD-10-CM

## 2023-06-30 DIAGNOSIS — K7030 Alcoholic cirrhosis of liver without ascites: Secondary | ICD-10-CM | POA: Diagnosis not present

## 2023-06-30 DIAGNOSIS — G629 Polyneuropathy, unspecified: Secondary | ICD-10-CM

## 2023-06-30 DIAGNOSIS — F418 Other specified anxiety disorders: Secondary | ICD-10-CM | POA: Diagnosis present

## 2023-06-30 DIAGNOSIS — R112 Nausea with vomiting, unspecified: Secondary | ICD-10-CM | POA: Diagnosis not present

## 2023-06-30 DIAGNOSIS — R7989 Other specified abnormal findings of blood chemistry: Secondary | ICD-10-CM | POA: Insufficient documentation

## 2023-06-30 DIAGNOSIS — F1010 Alcohol abuse, uncomplicated: Secondary | ICD-10-CM | POA: Diagnosis not present

## 2023-06-30 DIAGNOSIS — R519 Headache, unspecified: Principal | ICD-10-CM

## 2023-06-30 DIAGNOSIS — R1013 Epigastric pain: Secondary | ICD-10-CM | POA: Diagnosis not present

## 2023-06-30 DIAGNOSIS — Z955 Presence of coronary angioplasty implant and graft: Secondary | ICD-10-CM | POA: Diagnosis not present

## 2023-06-30 DIAGNOSIS — G43909 Migraine, unspecified, not intractable, without status migrainosus: Secondary | ICD-10-CM | POA: Diagnosis not present

## 2023-06-30 DIAGNOSIS — K529 Noninfective gastroenteritis and colitis, unspecified: Secondary | ICD-10-CM | POA: Diagnosis not present

## 2023-06-30 DIAGNOSIS — E876 Hypokalemia: Secondary | ICD-10-CM | POA: Diagnosis present

## 2023-06-30 DIAGNOSIS — K760 Fatty (change of) liver, not elsewhere classified: Secondary | ICD-10-CM | POA: Diagnosis not present

## 2023-06-30 DIAGNOSIS — F1410 Cocaine abuse, uncomplicated: Secondary | ICD-10-CM | POA: Diagnosis not present

## 2023-06-30 DIAGNOSIS — Z79899 Other long term (current) drug therapy: Secondary | ICD-10-CM

## 2023-06-30 DIAGNOSIS — G8929 Other chronic pain: Secondary | ICD-10-CM | POA: Diagnosis present

## 2023-06-30 DIAGNOSIS — Z720 Tobacco use: Secondary | ICD-10-CM | POA: Diagnosis present

## 2023-06-30 DIAGNOSIS — Z880 Allergy status to penicillin: Secondary | ICD-10-CM

## 2023-06-30 DIAGNOSIS — R160 Hepatomegaly, not elsewhere classified: Secondary | ICD-10-CM | POA: Diagnosis present

## 2023-06-30 DIAGNOSIS — I5032 Chronic diastolic (congestive) heart failure: Secondary | ICD-10-CM | POA: Diagnosis present

## 2023-06-30 DIAGNOSIS — R109 Unspecified abdominal pain: Secondary | ICD-10-CM | POA: Diagnosis not present

## 2023-06-30 DIAGNOSIS — D6959 Other secondary thrombocytopenia: Secondary | ICD-10-CM | POA: Diagnosis present

## 2023-06-30 DIAGNOSIS — D696 Thrombocytopenia, unspecified: Secondary | ICD-10-CM | POA: Diagnosis present

## 2023-06-30 DIAGNOSIS — I959 Hypotension, unspecified: Secondary | ICD-10-CM | POA: Diagnosis not present

## 2023-06-30 DIAGNOSIS — R262 Difficulty in walking, not elsewhere classified: Secondary | ICD-10-CM | POA: Diagnosis not present

## 2023-06-30 DIAGNOSIS — Z811 Family history of alcohol abuse and dependence: Secondary | ICD-10-CM

## 2023-06-30 DIAGNOSIS — K219 Gastro-esophageal reflux disease without esophagitis: Secondary | ICD-10-CM | POA: Diagnosis present

## 2023-06-30 DIAGNOSIS — D61818 Other pancytopenia: Secondary | ICD-10-CM | POA: Diagnosis not present

## 2023-06-30 DIAGNOSIS — I25119 Atherosclerotic heart disease of native coronary artery with unspecified angina pectoris: Secondary | ICD-10-CM | POA: Diagnosis present

## 2023-06-30 DIAGNOSIS — Z8674 Personal history of sudden cardiac arrest: Secondary | ICD-10-CM

## 2023-06-30 DIAGNOSIS — E782 Mixed hyperlipidemia: Secondary | ICD-10-CM

## 2023-06-30 DIAGNOSIS — Z7902 Long term (current) use of antithrombotics/antiplatelets: Secondary | ICD-10-CM

## 2023-06-30 DIAGNOSIS — R197 Diarrhea, unspecified: Secondary | ICD-10-CM | POA: Diagnosis not present

## 2023-06-30 LAB — CBC WITH DIFFERENTIAL/PLATELET
Abs Immature Granulocytes: 0.01 10*3/uL (ref 0.00–0.07)
Basophils Absolute: 0.1 10*3/uL (ref 0.0–0.1)
Basophils Relative: 1 %
Eosinophils Absolute: 0.2 10*3/uL (ref 0.0–0.5)
Eosinophils Relative: 3 %
HCT: 30 % — ABNORMAL LOW (ref 39.0–52.0)
Hemoglobin: 10.1 g/dL — ABNORMAL LOW (ref 13.0–17.0)
Immature Granulocytes: 0 %
Lymphocytes Relative: 38 %
Lymphs Abs: 2.1 10*3/uL (ref 0.7–4.0)
MCH: 30.2 pg (ref 26.0–34.0)
MCHC: 33.7 g/dL (ref 30.0–36.0)
MCV: 89.8 fL (ref 80.0–100.0)
Monocytes Absolute: 0.6 10*3/uL (ref 0.1–1.0)
Monocytes Relative: 11 %
Neutro Abs: 2.6 10*3/uL (ref 1.7–7.7)
Neutrophils Relative %: 47 %
Platelets: 112 10*3/uL — ABNORMAL LOW (ref 150–400)
RBC: 3.34 MIL/uL — ABNORMAL LOW (ref 4.22–5.81)
RDW: 17.6 % — ABNORMAL HIGH (ref 11.5–15.5)
WBC: 5.6 10*3/uL (ref 4.0–10.5)
nRBC: 0 % (ref 0.0–0.2)

## 2023-06-30 LAB — COMPREHENSIVE METABOLIC PANEL
ALT: 26 U/L (ref 0–44)
AST: 99 U/L — ABNORMAL HIGH (ref 15–41)
Albumin: 2.9 g/dL — ABNORMAL LOW (ref 3.5–5.0)
Alkaline Phosphatase: 289 U/L — ABNORMAL HIGH (ref 38–126)
Anion gap: 10 (ref 5–15)
BUN: <5 mg/dL — ABNORMAL LOW (ref 6–20)
CO2: 20 mmol/L — ABNORMAL LOW (ref 22–32)
Calcium: 8.1 mg/dL — ABNORMAL LOW (ref 8.9–10.3)
Chloride: 106 mmol/L (ref 98–111)
Creatinine, Ser: 0.52 mg/dL — ABNORMAL LOW (ref 0.61–1.24)
GFR, Estimated: >60 mL/min (ref >60)
Glucose, Bld: 110 mg/dL — ABNORMAL HIGH (ref 70–99)
Potassium: 2.5 mmol/L — CL (ref 3.5–5.1)
Sodium: 136 mmol/L (ref 135–145)
Total Bilirubin: 3.4 mg/dL — ABNORMAL HIGH (ref 0.3–1.2)
Total Protein: 6.8 g/dL (ref 6.5–8.1)

## 2023-06-30 LAB — PHOSPHORUS: Phosphorus: 3.8 mg/dL (ref 2.5–4.6)

## 2023-06-30 LAB — PROTIME-INR
INR: 1.5 — ABNORMAL HIGH (ref 0.8–1.2)
Prothrombin Time: 18.2 seconds — ABNORMAL HIGH (ref 11.4–15.2)

## 2023-06-30 LAB — LIPASE, BLOOD: Lipase: 41 U/L (ref 11–51)

## 2023-06-30 LAB — BRAIN NATRIURETIC PEPTIDE: B Natriuretic Peptide: 234.1 pg/mL — ABNORMAL HIGH (ref 0.0–100.0)

## 2023-06-30 LAB — APTT: aPTT: 40 seconds — ABNORMAL HIGH (ref 24–36)

## 2023-06-30 LAB — MAGNESIUM: Magnesium: 1.7 mg/dL (ref 1.7–2.4)

## 2023-06-30 LAB — ETHANOL: Alcohol, Ethyl (B): 315 mg/dL (ref <10)

## 2023-06-30 LAB — AMMONIA: Ammonia: 49 umol/L — ABNORMAL HIGH (ref 9–35)

## 2023-06-30 MED ORDER — NICOTINE 21 MG/24HR TD PT24
21.0000 mg | MEDICATED_PATCH | Freq: Every day | TRANSDERMAL | Status: DC
  Administered 2023-06-30 – 2023-07-04 (×5): 21 mg via TRANSDERMAL
  Filled 2023-06-30 (×6): qty 1

## 2023-06-30 MED ORDER — LORAZEPAM 2 MG/ML IJ SOLN
0.0000 mg | Freq: Four times a day (QID) | INTRAMUSCULAR | Status: AC
  Administered 2023-07-02: 1 mg via INTRAVENOUS
  Filled 2023-06-30: qty 1

## 2023-06-30 MED ORDER — OXYCODONE HCL 5 MG PO TABS
5.0000 mg | ORAL_TABLET | Freq: Four times a day (QID) | ORAL | Status: DC | PRN
  Administered 2023-06-30 – 2023-07-05 (×12): 5 mg via ORAL
  Filled 2023-06-30 (×12): qty 1

## 2023-06-30 MED ORDER — METHOCARBAMOL 500 MG PO TABS
750.0000 mg | ORAL_TABLET | Freq: Two times a day (BID) | ORAL | Status: DC | PRN
  Administered 2023-07-04: 750 mg via ORAL
  Filled 2023-06-30: qty 2

## 2023-06-30 MED ORDER — MAGNESIUM SULFATE IN D5W 1-5 GM/100ML-% IV SOLN
1.0000 g | Freq: Once | INTRAVENOUS | Status: AC
  Administered 2023-06-30: 1 g via INTRAVENOUS
  Filled 2023-06-30: qty 100

## 2023-06-30 MED ORDER — ONDANSETRON HCL 4 MG/2ML IJ SOLN: 4.0000 mg | Freq: Three times a day (TID) | INTRAMUSCULAR | Status: DC | PRN

## 2023-06-30 MED ORDER — THIAMINE HCL 100 MG/ML IJ SOLN
100.0000 mg | Freq: Every day | INTRAMUSCULAR | Status: DC
  Administered 2023-07-05: 100 mg via INTRAVENOUS
  Filled 2023-06-30 (×4): qty 2

## 2023-06-30 MED ORDER — PANTOPRAZOLE SODIUM 40 MG PO TBEC
40.0000 mg | DELAYED_RELEASE_TABLET | Freq: Two times a day (BID) | ORAL | Status: DC
  Administered 2023-06-30 – 2023-07-05 (×10): 40 mg via ORAL
  Filled 2023-06-30 (×10): qty 1

## 2023-06-30 MED ORDER — POTASSIUM CHLORIDE CRYS ER 20 MEQ PO TBCR
40.0000 meq | EXTENDED_RELEASE_TABLET | ORAL | Status: AC
  Administered 2023-06-30 (×2): 40 meq via ORAL
  Filled 2023-06-30 (×2): qty 2

## 2023-06-30 MED ORDER — ADULT MULTIVITAMIN W/MINERALS CH
1.0000 | ORAL_TABLET | Freq: Every day | ORAL | Status: DC
  Administered 2023-06-30 – 2023-07-05 (×6): 1 via ORAL
  Filled 2023-06-30 (×6): qty 1

## 2023-06-30 MED ORDER — NITROGLYCERIN 0.4 MG SL SUBL: 0.4000 mg | SUBLINGUAL_TABLET | SUBLINGUAL | Status: DC | PRN

## 2023-06-30 MED ORDER — IOHEXOL 300 MG/ML  SOLN
100.0000 mL | Freq: Once | INTRAMUSCULAR | Status: AC | PRN
  Administered 2023-06-30: 100 mL via INTRAVENOUS

## 2023-06-30 MED ORDER — SUMATRIPTAN SUCCINATE 6 MG/0.5ML ~~LOC~~ SOLN
6.0000 mg | SUBCUTANEOUS | Status: DC | PRN
  Administered 2023-06-30: 6 mg via SUBCUTANEOUS
  Filled 2023-06-30 (×2): qty 0.5

## 2023-06-30 MED ORDER — THIAMINE MONONITRATE 100 MG PO TABS
100.0000 mg | ORAL_TABLET | Freq: Every day | ORAL | Status: DC
  Administered 2023-06-30 – 2023-07-04 (×5): 100 mg via ORAL
  Filled 2023-06-30 (×5): qty 1

## 2023-06-30 MED ORDER — CLOPIDOGREL BISULFATE 75 MG PO TABS
75.0000 mg | ORAL_TABLET | Freq: Every day | ORAL | Status: DC
  Administered 2023-06-30 – 2023-07-02 (×3): 75 mg via ORAL
  Filled 2023-06-30 (×3): qty 1

## 2023-06-30 MED ORDER — FOLIC ACID 1 MG PO TABS
1.0000 mg | ORAL_TABLET | Freq: Every day | ORAL | Status: DC
  Administered 2023-06-30 – 2023-07-05 (×6): 1 mg via ORAL
  Filled 2023-06-30 (×6): qty 1

## 2023-06-30 MED ORDER — POTASSIUM CHLORIDE 10 MEQ/100ML IV SOLN
10.0000 meq | INTRAVENOUS | Status: AC
  Administered 2023-06-30 (×2): 10 meq via INTRAVENOUS
  Filled 2023-06-30 (×2): qty 100

## 2023-06-30 MED ORDER — HYDRALAZINE HCL 20 MG/ML IJ SOLN: 5.0000 mg | INTRAMUSCULAR | Status: DC | PRN

## 2023-06-30 MED ORDER — GABAPENTIN 100 MG PO CAPS
100.0000 mg | ORAL_CAPSULE | Freq: Two times a day (BID) | ORAL | Status: DC
  Administered 2023-06-30 – 2023-07-05 (×10): 100 mg via ORAL
  Filled 2023-06-30 (×10): qty 1

## 2023-06-30 MED ORDER — ROSUVASTATIN CALCIUM 20 MG PO TABS
40.0000 mg | ORAL_TABLET | Freq: Every day | ORAL | Status: DC
  Administered 2023-06-30 – 2023-07-05 (×6): 40 mg via ORAL
  Filled 2023-06-30 (×7): qty 2

## 2023-06-30 MED ORDER — RIFAXIMIN 550 MG PO TABS
550.0000 mg | ORAL_TABLET | Freq: Two times a day (BID) | ORAL | Status: DC
  Administered 2023-06-30 – 2023-07-05 (×10): 550 mg via ORAL
  Filled 2023-06-30 (×11): qty 1

## 2023-06-30 MED ORDER — QUETIAPINE FUMARATE 25 MG PO TABS
50.0000 mg | ORAL_TABLET | Freq: Every day | ORAL | Status: DC
  Administered 2023-06-30 – 2023-07-04 (×5): 50 mg via ORAL
  Filled 2023-06-30 (×5): qty 2

## 2023-06-30 MED ORDER — GADOBUTROL 1 MMOL/ML IV SOLN
8.0000 mL | Freq: Once | INTRAVENOUS | Status: AC | PRN
  Administered 2023-06-30: 8 mL via INTRAVENOUS

## 2023-06-30 MED ORDER — LORAZEPAM 2 MG/ML IJ SOLN
0.0000 mg | Freq: Two times a day (BID) | INTRAMUSCULAR | Status: AC
  Administered 2023-07-02 – 2023-07-04 (×4): 1 mg via INTRAVENOUS
  Filled 2023-06-30 (×4): qty 1

## 2023-06-30 MED ORDER — LORAZEPAM 2 MG/ML IJ SOLN
1.0000 mg | INTRAMUSCULAR | Status: AC | PRN
  Administered 2023-07-01: 2 mg via INTRAVENOUS
  Filled 2023-06-30: qty 1

## 2023-06-30 MED ORDER — LORAZEPAM 1 MG PO TABS
1.0000 mg | ORAL_TABLET | ORAL | Status: AC | PRN
  Administered 2023-06-30 – 2023-07-02 (×2): 1 mg via ORAL
  Filled 2023-06-30 (×2): qty 1

## 2023-06-30 MED ORDER — HYDROCORTISONE 5 MG PO TABS
5.0000 mg | ORAL_TABLET | Freq: Every day | ORAL | Status: DC
  Administered 2023-07-01 – 2023-07-04 (×4): 5 mg via ORAL
  Filled 2023-06-30 (×5): qty 1

## 2023-06-30 MED ORDER — LACTULOSE 10 GM/15ML PO SOLN
20.0000 g | Freq: Two times a day (BID) | ORAL | Status: DC
  Administered 2023-06-30: 20 g via ORAL
  Filled 2023-06-30: qty 30

## 2023-06-30 NOTE — ED Provider Notes (Signed)
[facility] Provider Note    Event Date/Time   First MD Initiated Contact with Patient 06/30/23 1245     (approximate)   History   Migraine   HPI  Ethan Wells is a 52 y.o. male   with history of alcoholic liver cirrhosis with portal gastropathy s/p TIPS, HTN, CAD, GERD, migraine headaches presenting to the ER for evaluation of headache and weakness.  Patient reports that for the last several days he has had an ongoing headache similar to prior migraines.  He reports that he drank alcohol this morning to try and help and also took gabapentin and a muscle relaxer.  He now is reporting that he is having some generalized abdominal pain.  No fevers.      Physical Exam   Triage Vital Signs: ED Triage Vitals  Encounter Vitals Group     BP 06/30/23 1258 123/70     Systolic BP Percentile --      Diastolic BP Percentile --      Pulse Rate 06/30/23 1258 81     Resp 06/30/23 1258 10     Temp 06/30/23 1258 98.3 F (36.8 C)     Temp Source 06/30/23 1258 Oral     SpO2 06/30/23 1258 94 %     Weight --      Height --      Head Circumference --      Peak Flow --      Pain Score 06/30/23 1251 8     Pain Loc --      Pain Education --      Exclude from Growth Chart --     Most recent vital signs: Vitals:   06/30/23 1258  BP: 123/70  Pulse: 81  Resp: 10  Temp: 98.3 F (36.8 C)  SpO2: 94%     General: Awake, interactive  CV:  Regular rate, good peripheral perfusion.  Resp:  Lungs clear, unlabored respirations.  Abd:  Soft, nondistended, mild generalized tenderness without rebound or guarding Neuro:  No gross facial asymmetry 3, slurred speech, moving extremities spontaneously and equally   ED Results / Procedures / Treatments   Labs (all labs ordered are listed, but only abnormal results are displayed) Labs Reviewed  COMPREHENSIVE METABOLIC PANEL - Abnormal; Notable for the following components:      Result Value   Potassium 2.5 (*)     CO2 20 (*)    Glucose, Bld 110 (*)    BUN <5 (*)    Creatinine, Ser 0.52 (*)    Calcium 8.1 (*)    Albumin 2.9 (*)    AST 99 (*)    Alkaline Phosphatase 289 (*)    Total Bilirubin 3.4 (*)    All other components within normal limits  CBC WITH DIFFERENTIAL/PLATELET - Abnormal; Notable for the following components:   RBC 3.34 (*)    Hemoglobin 10.1 (*)    HCT 30.0 (*)    RDW 17.6 (*)    Platelets 112 (*)    All other components within normal limits  ETHANOL - Abnormal; Notable for the following components:   Alcohol, Ethyl (B) 315 (*)    All other components within normal limits  AMMONIA - Abnormal; Notable for the following components:   Ammonia 49 (*)    All other components within normal limits  PROTIME-INR - Abnormal; Notable for the following components:   Prothrombin Time 18.2 (*)    INR 1.5 (*)    All  other components within normal limits  MAGNESIUM  URINE DRUG SCREEN, QUALITATIVE ([facility] ONLY)     EKG EKG independently reviewed interpreted by myself (ER attending) demonstrates:  EKG demonstrates sinus rhythm at a rate of 84, PR 165, QRS 102, QTc 452, no acute ST changes  RADIOLOGY Imaging independently reviewed and interpreted by myself demonstrates:  CT head without acute bleed CT abdomen pelvis without bowel obstruction or perforation.  Radiology does note hepatic mass concerning for possible HCC.  PROCEDURES:  Critical Care performed: Yes, see critical care procedure note(s)  CRITICAL CARE Performed by: Trinna Post   Total critical care time: 34 minutes  Critical care time was exclusive of separately billable procedures and treating other patients.  Critical care was necessary to treat or prevent imminent or life-threatening deterioration.  Critical care was time spent personally by me on the following activities: development of treatment plan with patient and/or surrogate as well as nursing, discussions with consultants, evaluation of patient's response to  treatment, examination of patient, obtaining history from patient or surrogate, ordering and performing treatments and interventions, ordering and review of laboratory studies, ordering and review of radiographic studies, pulse oximetry and re-evaluation of patient's condition.   Procedures   MEDICATIONS ORDERED IN ED: Medications  potassium chloride 10 mEq in 100 mL IVPB (10 mEq Intravenous New Bag/Given 06/30/23 1517)  iohexol (OMNIPAQUE) 300 MG/ML solution 100 mL (100 mLs Intravenous Contrast Given 06/30/23 1446)     IMPRESSION / MDM / ASSESSMENT AND PLAN / ED COURSE  I reviewed the triage vital signs and the nursing notes.  Differential diagnosis includes, but is not limited to, intracranial bleed, acute intra-abdominal process including bowel obstruction, diverticulitis, colitis, anemia, electrolyte abnormality, drug intoxication  Patient's presentation is most consistent with acute presentation with potential threat to life or bodily function.  52 year old male presenting with headache and abdominal pain with history of similar for both.  He does appear clinically intoxicated on exam.  Alcohol is elevated at 315. If,  Lab work demonstrates stable anemia.  Also notable for significant hypokalemia with K of 2.5 worsened from a month ago when he had a K of 2.7.  I have ordered some potassium repletion, but in the setting of his comorbidities I do think he is appropriate for inpatient admission for further monitoring.  His imaging does not demonstrate any emergency findings, but concerning hepatic mass noted by radiology.  Will reach out to hospitalist team to discuss admission.  Case reviewed with Dr. Clyde Lundborg.  He will evaluate the patient for anticipated admission.      FINAL CLINICAL IMPRESSION(S) / ED DIAGNOSES   Final diagnoses:  Acute nonintractable headache, unspecified headache type  Hypokalemia     Rx / DC Orders   ED Discharge Orders     None        Note:  This  document was prepared using Dragon voice recognition software and may include unintentional dictation errors.   Trinna Post, MD 06/30/23 [phone]

## 2023-06-30 NOTE — Progress Notes (Signed)
Pt to MRI. Ethan Wells

## 2023-06-30 NOTE — H&P (Signed)
History and Physical    Ethan Wells:[id number] DOB: 05/19/1971 DOA: 06/30/2023  Referring MD/NP/PA:   PCP: [name], NP   Patient coming from:  The patient is coming from home.     Chief Complaint: Nausea, vomiting, diarrhea, abdominal pain, headache  HPI: Ethan Wells is a 52 y.o. male with medical history significant of alcoholic liver cirrhosis with portal gastropathy, s/p of TIPS, superior mesenteric artery stenosis, HTN, HLD, CAD, STEMI, dCHF, GERD, depression with anxiety, adrenal insufficiency (Addison's disease), migraine headache, cardiac arrest due to V-fib, GI bleeding, polysubstance abuse, cocaine abuse, tobacco abuse, who presents with nausea, vomiting, diarrhea, abdominal pain, headache.  Patient states that in the past several days he has nausea, vomiting, diarrhea and abdominal pain.  He has several episodes of nonbilious nonbloody vomiting each day, and a few times of watery diarrhea.  His abdominal pain is located in the central abdomen, constant, aching, nonradiating.  No fever or chills.  Denies chest pain, cough, shortness of breath.  He also reports migraine headache which has been going on for several days.  He states that he drinks alcohol for treating his migraine headache.  Data reviewed independently and ED Course: pt was found to have WBC 5.6, alcohol level 315, ammonia level 49, potassium 2.5, magnesium 1.7, temperature normal, blood pressure 123/70, heart rate 81, RR 18, oxygen saturation 94% on room air.  CT of head negative for acute intracranial abnormalities.  CT of abdomen/pelvis showed liver mass, suspecting malignancy.  Patient is placed on tele bed for obs.  consulted Dr. Cathie Hoops of oncology.  CT abdomen/pelvis 1. No acute CT findings of the abdomen or pelvis to explain abdominal pain. 2. Cirrhosis and hepatic steatosis. Masslike, partially exophytic lesion arising from inferior hepatic segment V, adjacent to the gallbladder  fossa, measuring 5.7 x 3.9 cm. This is suspicious for hepatocellular carcinoma. Recommend further evaluation with contrast enhanced MRI on a nonemergent, outpatient basis. 3. Right hepatic vein TIPS. 4. Celiac axis and superior mesenteric artery stenting.   Aortic Atherosclerosis (ICD10-I70.0).  EKG: I have personally reviewed.  Has artificial effects, sinus rhythm, QTc 452, nonspecific T wave change   Review of Systems:   General: no fevers, chills, no body weight gain, has poor appetite, has fatigue and HA HEENT: no blurry vision, hearing changes or sore throat Respiratory: no dyspnea, coughing, wheezing CV: no chest pain, no palpitations GI: has nausea, vomiting, abdominal pain, diarrhea GU: no dysuria, burning on urination, increased urinary frequency, hematuria  Ext: no leg edema Neuro: no unilateral weakness, numbness, or tingling, no vision change or hearing loss Skin: no rash, no skin tear. MSK: No muscle spasm, no deformity, no limitation of range of movement in spin Heme: No easy bruising.  Travel history: No recent long distant travel.   Allergy:  Allergies  Allergen Reactions   Penicillins Hives and Rash    Tolerated Ceftriaxone 10/2022    Past Medical History:  Diagnosis Date   CAD (coronary artery disease)    a. 01/2011 Anterior STEMI/Cath/PCI: LM nl, LAD 100d (3.5x35mm Vision BMS placed), LCX 64m, RI 50, RCA min irregs, EF 40% w/ apical, inferoapical HK; b. 10/2022 NSTSEMI/Cath: LM nl, LAD 5p/m, RI 85, LCX 163m, OM1 mild dzs, OM3 100 fills via L->L collats from dLAD, RCA 80p, 163m, RPDA fills via collats from LAD. EF 45-50%-->Med Rx.   Cardiac arrest - ventricular fibrillation    a. In setting of STEMI 01/2011   Chronic pain    Cocaine  abuse (HCC)    Depression with anxiety    ETOH abuse    a. 6-12 beers/day   GERD (gastroesophageal reflux disease)    GI bleed    Hemorrhoids    Hyperlipidemia    Hypertension    Ischemic cardiomyopathy    a. 06/2011 Echo:  EF 45-50%, No rwma; b. 10/2022 Echo: EF 55-60%, no rwma, nl RV fxn.   Marijuana abuse    Migraines    Tobacco abuse    a. 1/2 ppd x 26 yrs    Past Surgical History:  Procedure Laterality Date   COLONOSCOPY  08/09/2021   Procedure: COLONOSCOPY;  Surgeon: Midge Minium, MD;  Location: [facility] ENDOSCOPY;  Service: Endoscopy;;   CORONARY ANGIOPLASTY WITH STENT PLACEMENT     ESOPHAGEAL BANDING  04/08/2023   Procedure: ESOPHAGEAL BANDING;  Surgeon: Tressia Danas, MD;  Location: [facility] ENDOSCOPY;  Service: Gastroenterology;;   ESOPHAGOGASTRODUODENOSCOPY N/A 05/30/2022   Procedure: ESOPHAGOGASTRODUODENOSCOPY (EGD);  Surgeon: Toney Reil, MD;  Location: [facility] ENDOSCOPY;  Service: Gastroenterology;  Laterality: N/A;   ESOPHAGOGASTRODUODENOSCOPY (EGD) WITH PROPOFOL N/A 03/01/2021   Procedure: ESOPHAGOGASTRODUODENOSCOPY (EGD) WITH PROPOFOL;  Surgeon: Toney Reil, MD;  Location: [facility]GERY [facility];  Service: Endoscopy;  Laterality: N/A;   ESOPHAGOGASTRODUODENOSCOPY (EGD) WITH PROPOFOL N/A 08/09/2021   Procedure: ESOPHAGOGASTRODUODENOSCOPY (EGD) WITH PROPOFOL;  Surgeon: Midge Minium, MD;  Location: [facility] ENDOSCOPY;  Service: Endoscopy;  Laterality: N/A;   ESOPHAGOGASTRODUODENOSCOPY (EGD) WITH PROPOFOL N/A 03/01/2023   Procedure: ESOPHAGOGASTRODUODENOSCOPY (EGD) WITH PROPOFOL;  Surgeon: Jaynie Collins, DO;  Location: [facility] ENDOSCOPY;  Service: Gastroenterology;  Laterality: N/A;   ESOPHAGOGASTRODUODENOSCOPY (EGD) WITH PROPOFOL N/A 04/08/2023   Procedure: ESOPHAGOGASTRODUODENOSCOPY (EGD) WITH PROPOFOL;  Surgeon: Tressia Danas, MD;  Location: [facility] ENDOSCOPY;  Service: Gastroenterology;  Laterality: N/A;   EVALUATION UNDER ANESTHESIA WITH HEMORRHOIDECTOMY N/A 10/31/2022   Procedure: EXAM UNDER ANESTHESIA WITH SUTURE LIGATION OF TWO BLEEDING PEDICLES;  Surgeon: Henrene Dodge, MD;  Location: [facility] ORS;  Service: General;  Laterality: N/A;   FLEXIBLE SIGMOIDOSCOPY N/A 10/02/2022   Procedure:  FLEXIBLE SIGMOIDOSCOPY;  Surgeon: Meryl Dare, MD;  Location: [facility] ENDOSCOPY;  Service: Gastroenterology;  Laterality: N/A;   IR TIPS  04/11/2023   IR US GUIDE VASC ACCESS RIGHT  04/11/2023   IR US GUIDE VASC ACCESS RIGHT  04/11/2023   IR US GUIDE VASC ACCESS RIGHT  04/11/2023   LEFT HEART CATH AND CORONARY ANGIOGRAPHY N/A 11/04/2022   Procedure: LEFT HEART CATH AND CORONARY ANGIOGRAPHY;  Surgeon: Iran Ouch, MD;  Location: [facility] INVASIVE CV LAB;  Service: Cardiovascular;  Laterality: N/A;   RADIOLOGY WITH ANESTHESIA N/A 04/11/2023   Procedure: IR WITH ANESTHESIA;  Surgeon: Oley Balm, MD;  Location: [facility] OR;  Service: Radiology;  Laterality: N/A;   VISCERAL ANGIOGRAPHY N/A 10/24/2022   Procedure: VISCERAL ANGIOGRAPHY;  Surgeon: Annice Needy, MD;  Location: [facility] INVASIVE CV LAB;  Service: Cardiovascular;  Laterality: N/A;   VISCERAL ARTERY INTERVENTION N/A 05/09/2023   Procedure: VISCERAL ARTERY INTERVENTION;  Surgeon: Renford Dills, MD;  Location: [facility] INVASIVE CV LAB;  Service: Cardiovascular;  Laterality: N/A;    Social History:  reports that he has been smoking cigarettes. He has a 20.5 pack-year smoking history. He has never used smokeless tobacco. He reports current alcohol use of about 12.0 standard drinks of alcohol per week. He reports current drug use. Drugs: Cocaine and Marijuana.  Family History:  Family History  Problem Relation Age of Onset   Coronary artery disease Mother  alive   Other Other        2 sisters alive and well   Alcohol abuse Father        died @ 53   Other Paternal Grandmother        PACER     Prior to Admission medications   Medication Sig Start Date End Date Taking? Authorizing Provider  clopidogrel (PLAVIX) 75 MG tablet Take 1 tablet (75 mg total) by mouth daily. 05/14/23   Wouk, Wilfred Curtis, MD  folic acid (FOLVITE) 1 MG tablet Take 1 tablet (1 mg total) by mouth daily. 04/29/23   Ghimire, Werner Lean, MD  gabapentin (NEURONTIN) 100 MG  capsule Take 1 capsule (100 mg total) by mouth 2 (two) times daily. 05/13/23 07/12/23  Wouk, Wilfred Curtis, MD  hydrocortisone (CORTEF) 10 MG tablet Take 1/2 tablet (5 mg total) by mouth daily after lunch. 04/28/23 04/27/24  Ghimire, Werner Lean, MD  methocarbamol (ROBAXIN) 750 MG tablet Take 1 tablet (750 mg total) by mouth 2 (two) times daily as needed for muscle spasms. 06/09/23   Fargo, Amy E, NP  midodrine (PROAMATINE) 10 MG tablet Take 1 tablet (10 mg total) by mouth 3 (three) times daily. Skip the dose if Systolic BP greater than 110 mmHg and or HR greater than 100 05/13/23 08/11/23  Wouk, Wilfred Curtis, MD  Multiple Vitamin (MULTIVITAMIN WITH MINERALS) TABS tablet Take 1 tablet by mouth daily. 04/29/23   Ghimire, Werner Lean, MD  nitroGLYCERIN (NITROSTAT) 0.4 MG SL tablet Place 1 tablet (0.4 mg total) under the tongue every 5 (five) minutes as needed for chest pain. 04/28/23   Ghimire, Werner Lean, MD  pantoprazole (PROTONIX) 40 MG tablet Take 1 tablet (40 mg total) by mouth 2 (two) times daily. 04/28/23 06/27/23  Ghimire, Werner Lean, MD  QUEtiapine (SEROQUEL) 50 MG tablet Take 1 tablet (50 mg total) by mouth at bedtime. 04/28/23 06/27/23  Ghimire, Werner Lean, MD  rifaximin (XIFAXAN) 550 MG TABS tablet Take 1 tablet (550 mg total) by mouth 2 (two) times daily. 04/28/23   Ghimire, Werner Lean, MD  rosuvastatin (CRESTOR) 40 MG tablet Take 1 tablet (40 mg total) by mouth daily. 04/28/23 04/27/24  Ghimire, Werner Lean, MD  thiamine (VITAMIN B1) 100 MG tablet Take 1 tablet (100 mg total) by mouth daily. 04/29/23   Maretta Bees, MD    Physical Exam: Vitals:   06/30/23 1258 06/30/23 1736 06/30/23 1750  BP: 123/70    Pulse: 81 89   Resp: 10    Temp: 98.3 F (36.8 C)  98.3 F (36.8 C)  TempSrc: Oral  Oral  SpO2: 94%     General: Not in acute distress HEENT:       Eyes: PERRL, EOMI, no jaundice       ENT: No discharge from the ears and nose, no pharynx injection, no tonsillar enlargement.        Neck: No JVD, no  bruit, no mass felt. Heme: No neck lymph node enlargement. Cardiac: S1/S2, RRR, No murmurs, No gallops or rubs. Respiratory: No rales, wheezing, rhonchi or rubs. GI: Soft, nondistended, has central abdominal tenderness, no rebound pain, no organomegaly, BS present. GU: No hematuria Ext: No pitting leg edema bilaterally. 1+DP/PT pulse bilaterally. Musculoskeletal: No joint deformities, No joint redness or warmth, no limitation of ROM in spin. Skin: No rashes.  Neuro: Alert, oriented X3, cranial nerves II-XII grossly intact, moves all extremities normally.  Psych: Patient is not psychotic, no suicidal or hemocidal ideation.  Labs  on Admission: I have personally reviewed following labs and imaging studies  CBC: Recent Labs  Lab 06/30/23 1300  WBC 5.6  NEUTROABS 2.6  HGB 10.1*  HCT 30.0*  MCV 89.8  PLT 112*   Basic Metabolic Panel: Recent Labs  Lab 06/30/23 1300  NA 136  K 2.5*  CL 106  CO2 20*  GLUCOSE 110*  BUN <5*  CREATININE 0.52*  CALCIUM 8.1*  MG 1.7   GFR: CrCl cannot be calculated (Unknown ideal weight.). Liver Function Tests: Recent Labs  Lab 06/30/23 1300  AST 99*  ALT 26  ALKPHOS 289*  BILITOT 3.4*  PROT 6.8  ALBUMIN 2.9*   Recent Labs  Lab 06/30/23 1700  LIPASE 41   Recent Labs  Lab 06/30/23 1515  AMMONIA 49*   Coagulation Profile: Recent Labs  Lab 06/30/23 1300  INR 1.5*   Cardiac Enzymes: No results for input(s): "CKTOTAL", "CKMB", "CKMBINDEX", "TROPONINI" in the last 168 hours. BNP (last 3 results) No results for input(s): "PROBNP" in the last 8760 hours. HbA1C: No results for input(s): "HGBA1C" in the last 72 hours. CBG: No results for input(s): "GLUCAP" in the last 168 hours. Lipid Profile: No results for input(s): "CHOL", "HDL", "LDLCALC", "TRIG", "CHOLHDL", "LDLDIRECT" in the last 72 hours. Thyroid Function Tests: No results for input(s): "TSH", "T4TOTAL", "FREET4", "T3FREE", "THYROIDAB" in the last 72 hours. Anemia  Panel: No results for input(s): "VITAMINB12", "FOLATE", "FERRITIN", "TIBC", "IRON", "RETICCTPCT" in the last 72 hours. Urine analysis:    Component Value Date/Time   COLORURINE YELLOW (A) 05/05/2023 0132   APPEARANCEUR CLOUDY (A) 05/05/2023 0132   APPEARANCEUR Clear 03/16/2012 1315   LABSPEC 1.027 05/05/2023 0132   LABSPEC 1.013 03/16/2012 1315   PHURINE 8.0 05/05/2023 0132   GLUCOSEU NEGATIVE 05/05/2023 0132   GLUCOSEU 50 mg/dL 65/46/5035 4656   HGBUR SMALL (A) 05/05/2023 0132   BILIRUBINUR NEGATIVE 05/05/2023 0132   BILIRUBINUR Negative 03/16/2012 1315   KETONESUR NEGATIVE 05/05/2023 0132   PROTEINUR NEGATIVE 05/05/2023 0132   NITRITE POSITIVE (A) 05/05/2023 0132   LEUKOCYTESUR LARGE (A) 05/05/2023 0132   LEUKOCYTESUR Negative 03/16/2012 1315   Sepsis Labs: @LABRCNTIP (procalcitonin:4,lacticidven:4) )No results found for this or any previous visit (from the past 240 hour(s)).   Radiological Exams on Admission: CT ABDOMEN PELVIS W CONTRAST  Result Date: 06/30/2023 CLINICAL DATA:  Abdominal pain, alcohol * Tracking Code: BO * EXAM: CT ABDOMEN AND PELVIS WITH CONTRAST TECHNIQUE: Multidetector CT imaging of the abdomen and pelvis was performed using the standard protocol following bolus administration of intravenous contrast. RADIATION DOSE REDUCTION: This exam was performed according to the departmental dose-optimization program which includes automated exposure control, adjustment of the mA and/or kV according to patient size and/or use of iterative reconstruction technique. CONTRAST:  OMNIPAQUE IOHEXOL 300 MG/ML  SOLN COMPARISON:  CT chest abdomen pelvis, 05/09/2023 FINDINGS: Lower chest: No acute abnormality. Hepatobiliary: Hepatic steatosis. Coarse, nodular contour of the liver with a severely heterogeneous parenchymal background. Masslike, partially exophytic lesion arising from inferior hepatic segment V, adjacent to the gallbladder fossa, measuring 5.7 x 3.9 cm (series 2,  image 36). Right hepatic vein TIPS. Contracted gallbladder. No gallstones or biliary ductal dilatation. Pancreas: Unremarkable. No pancreatic ductal dilatation or surrounding inflammatory changes. Spleen: Normal in size without significant abnormality. Adrenals/Urinary Tract: Adrenal glands are unremarkable. Kidneys are normal, without renal calculi, solid lesion, or hydronephrosis. Bladder is unremarkable. Stomach/Bowel: Stomach is within normal limits. Appendix not clearly visualized. No evidence of bowel wall thickening, distention, or inflammatory changes. Vascular/Lymphatic:  Aortic atherosclerosis. Celiac axis and superior mesenteric artery stenting. No enlarged abdominal or pelvic lymph nodes. Reproductive: No mass or other significant abnormality. Other: No abdominal wall hernia or abnormality. No ascites. Musculoskeletal: No acute or significant osseous findings. IMPRESSION: 1. No acute CT findings of the abdomen or pelvis to explain abdominal pain. 2. Cirrhosis and hepatic steatosis. Masslike, partially exophytic lesion arising from inferior hepatic segment V, adjacent to the gallbladder fossa, measuring 5.7 x 3.9 cm. This is suspicious for hepatocellular carcinoma. Recommend further evaluation with contrast enhanced MRI on a nonemergent, outpatient basis. 3. Right hepatic vein TIPS. 4. Celiac axis and superior mesenteric artery stenting. Aortic Atherosclerosis (ICD10-I70.0). Electronically Signed   By: Jearld Lesch M.D.   On: 06/30/2023 15:21   CT Head Wo Contrast  Result Date: 06/30/2023 CLINICAL DATA:  Provided history: Headache, increasing frequency or severity. EXAM: CT HEAD WITHOUT CONTRAST TECHNIQUE: Contiguous axial images were obtained from the base of the skull through the vertex without intravenous contrast. RADIATION DOSE REDUCTION: This exam was performed according to the departmental dose-optimization program which includes automated exposure control, adjustment of the mA and/or kV  according to patient size and/or use of iterative reconstruction technique. COMPARISON:  Prior head CT examinations 05/05/2023 and earlier. FINDINGS: Brain: Mild cerebral atrophy. There is no acute intracranial hemorrhage. No demarcated cortical infarct. No extra-axial fluid collection. No evidence of an intracranial mass. No midline shift. Vascular: No hyperdense vessel.  Atherosclerotic calcifications. Skull: No calvarial fracture or aggressive osseous lesion. Sinuses/Orbits: No mass or acute finding within the imaged orbits. No significant paranasal sinus disease at the imaged levels. IMPRESSION: 1.  No evidence of an acute intracranial abnormality. 2. Mild cerebral atrophy. Electronically Signed   By: Jackey Loge D.O.   On: 06/30/2023 15:18      Assessment/Plan Principal Problem:   Abdominal pain Active Problems:   Nausea vomiting and diarrhea   Adrenal insufficiency (Addison's disease) (HCC)   Liver mass   CAD (coronary artery disease)   HLD (hyperlipidemia)   Chronic diastolic CHF (congestive heart failure) (HCC)   Hypokalemia   Thrombocytopenia (HCC)   Migraine   Essential hypertension   Depression with anxiety   Polysubstance abuse (HCC)   Tobacco abuse   Cocaine abuse (HCC)   Alcohol abuse   Assessment and Plan:  Abdominal pain and nausea vomiting and diarrhea: Etiology is not clear, may be due to viral gastroenteritis. -Placed on head of bed for observation -Check C. difficile and GI pathogen panel -As needed oxycodone for abdominal pain -As needed Zofran for nausea -Check C. difficile  Adrenal insufficiency (Addison's disease) (HCC): Patient's not taking Cortef -Resume home Cortef 5 mg daily  Liver mass: Suspecting malignancy -will get MRI-abdomen -Consulted Dr. Cathie Hoops of oncology  CAD (coronary artery disease) -Resume Plavix and Crestor  HLD (hyperlipidemia) -Crestor  Chronic diastolic CHF (congestive heart failure) (HCC): 2D echo on 04/11/2023 showed EF of 55  to 60% with grade 2 diastolic dysfunction.  Patient does not have leg edema.  CHF seem to be compensated -Check BNP  Hypokalemia: Potassium 2.5.  Magnesium 1.7 -Repleted potassium -Give 1 g of magnesium sulfate  Thrombocytopenia (HCC): This is chronic issue, possibly due to liver cirrhosis.  Platelet 112 -Follow-up with CBC  Migraine -Try as needed sumatriptan  Essential hypertension -IV hydralazine as needed  Depression with anxiety -Seroquel  Polysubstance abuse (HCC), Tobacco abuse, Cocaine abuse (HCC) and Alcohol abuse: -Did counseling about importance of quitting substance use -Nicotine patch -CIWA protocol -Check UDS  DVT ppx: SCD  Code Status: Full code    Family Communication: not done, no family member is at bed side.    Disposition Plan:  Anticipate discharge back to previous environment  Consults called:  consulted Dr. Cathie Hoops of oncology.  Admission status and Level of care: Telemetry Medical:  for obs    Dispo: The patient is from: Home              Anticipated d/c is to: Home              Anticipated d/c date is: 1 day              Patient currently is not medically stable to d/c.    Severity of Illness:  The appropriate patient status for this patient is OBSERVATION. Observation status is judged to be reasonable and necessary in order to provide the required intensity of service to ensure the patient's safety. The patient's presenting symptoms, physical exam findings, and initial radiographic and laboratory data in the context of their medical condition is felt to place them at decreased risk for further clinical deterioration. Furthermore, it is anticipated that the patient will be medically stable for discharge from the hospital within 2 midnights of admission.        Date of Service 06/30/2023    Lorretta Harp [facility]   If 7PM-7AM, please contact night-coverage [email or web address] 06/30/2023, 6:13 PM

## 2023-06-30 NOTE — ED Notes (Signed)
Patient transported to CT 

## 2023-06-30 NOTE — Progress Notes (Signed)
Pt back in room. Rosey Bath

## 2023-06-30 NOTE — ED Triage Notes (Signed)
Pt comes in from the [facility] via [facility] with complaints of a migraine, abdominal pain, and having cold sweats. Pt has a history of alcohol and drug abuse. Pt's  last drink was this morning. Pt drank a pint of "boot liquor". Pt has a history of CHF,COPD, esophageal surgery,and 3 myocardial infarctions. Pt is alert and oriented x4 with no signs of distress at this time.

## 2023-07-01 DIAGNOSIS — F1910 Other psychoactive substance abuse, uncomplicated: Secondary | ICD-10-CM

## 2023-07-01 DIAGNOSIS — K7030 Alcoholic cirrhosis of liver without ascites: Secondary | ICD-10-CM | POA: Diagnosis not present

## 2023-07-01 DIAGNOSIS — K529 Noninfective gastroenteritis and colitis, unspecified: Secondary | ICD-10-CM

## 2023-07-01 DIAGNOSIS — R112 Nausea with vomiting, unspecified: Secondary | ICD-10-CM

## 2023-07-01 DIAGNOSIS — R197 Diarrhea, unspecified: Secondary | ICD-10-CM

## 2023-07-01 DIAGNOSIS — I5032 Chronic diastolic (congestive) heart failure: Secondary | ICD-10-CM

## 2023-07-01 DIAGNOSIS — D61818 Other pancytopenia: Secondary | ICD-10-CM | POA: Insufficient documentation

## 2023-07-01 DIAGNOSIS — D696 Thrombocytopenia, unspecified: Secondary | ICD-10-CM

## 2023-07-01 DIAGNOSIS — R160 Hepatomegaly, not elsewhere classified: Secondary | ICD-10-CM

## 2023-07-01 DIAGNOSIS — E872 Acidosis, unspecified: Secondary | ICD-10-CM | POA: Insufficient documentation

## 2023-07-01 LAB — CBC
HCT: 26.6 % — ABNORMAL LOW (ref 39.0–52.0)
Hemoglobin: 9 g/dL — ABNORMAL LOW (ref 13.0–17.0)
MCH: 29.8 pg (ref 26.0–34.0)
MCHC: 33.8 g/dL (ref 30.0–36.0)
MCV: 88.1 fL (ref 80.0–100.0)
Platelets: 73 10*3/uL — ABNORMAL LOW (ref 150–400)
RBC: 3.02 MIL/uL — ABNORMAL LOW (ref 4.22–5.81)
RDW: 17.6 % — ABNORMAL HIGH (ref 11.5–15.5)
WBC: 2.3 10*3/uL — ABNORMAL LOW (ref 4.0–10.5)
nRBC: 0 % (ref 0.0–0.2)

## 2023-07-01 LAB — BASIC METABOLIC PANEL
Anion gap: 5 (ref 5–15)
BUN: <5 mg/dL — ABNORMAL LOW (ref 6–20)
CO2: 19 mmol/L — ABNORMAL LOW (ref 22–32)
Calcium: 8.2 mg/dL — ABNORMAL LOW (ref 8.9–10.3)
Chloride: 111 mmol/L (ref 98–111)
Creatinine, Ser: 0.53 mg/dL — ABNORMAL LOW (ref 0.61–1.24)
GFR, Estimated: >60 mL/min (ref >60)
Glucose, Bld: 103 mg/dL — ABNORMAL HIGH (ref 70–99)
Potassium: 4 mmol/L (ref 3.5–5.1)
Sodium: 135 mmol/L (ref 135–145)

## 2023-07-01 LAB — COMPREHENSIVE METABOLIC PANEL
ALT: 22 U/L (ref 0–44)
AST: 90 U/L — ABNORMAL HIGH (ref 15–41)
Albumin: 2.6 g/dL — ABNORMAL LOW (ref 3.5–5.0)
Alkaline Phosphatase: 250 U/L — ABNORMAL HIGH (ref 38–126)
Anion gap: 6 (ref 5–15)
BUN: <5 mg/dL — ABNORMAL LOW (ref 6–20)
CO2: 21 mmol/L — ABNORMAL LOW (ref 22–32)
Calcium: 7.7 mg/dL — ABNORMAL LOW (ref 8.9–10.3)
Chloride: 112 mmol/L — ABNORMAL HIGH (ref 98–111)
Creatinine, Ser: 0.44 mg/dL — ABNORMAL LOW (ref 0.61–1.24)
GFR, Estimated: >60 mL/min (ref >60)
Glucose, Bld: 95 mg/dL (ref 70–99)
Potassium: 3.3 mmol/L — ABNORMAL LOW (ref 3.5–5.1)
Sodium: 139 mmol/L (ref 135–145)
Total Bilirubin: 3 mg/dL — ABNORMAL HIGH (ref 0.3–1.2)
Total Protein: 6.1 g/dL — ABNORMAL LOW (ref 6.5–8.1)

## 2023-07-01 LAB — MAGNESIUM: Magnesium: 1.9 mg/dL (ref 1.7–2.4)

## 2023-07-01 MED ORDER — POTASSIUM CHLORIDE CRYS ER 20 MEQ PO TBCR
40.0000 meq | EXTENDED_RELEASE_TABLET | ORAL | Status: AC
  Administered 2023-07-01 (×2): 40 meq via ORAL
  Filled 2023-07-01: qty 4
  Filled 2023-07-01: qty 2

## 2023-07-01 MED ORDER — KCL-LACTATED RINGERS 20 MEQ/L IV SOLN: INTRAVENOUS | Status: DC

## 2023-07-01 MED ORDER — SODIUM BICARBONATE 650 MG PO TABS
1300.0000 mg | ORAL_TABLET | Freq: Two times a day (BID) | ORAL | Status: DC
  Administered 2023-07-01 – 2023-07-04 (×8): 1300 mg via ORAL
  Filled 2023-07-01 (×8): qty 2

## 2023-07-01 MED ORDER — POTASSIUM CHLORIDE 10 MEQ/100ML IV SOLN
10.0000 meq | Freq: Once | INTRAVENOUS | Status: AC
  Administered 2023-07-01: 10 meq via INTRAVENOUS
  Filled 2023-07-01: qty 100

## 2023-07-01 MED ORDER — POTASSIUM CHLORIDE 2 MEQ/ML IV SOLN
INTRAVENOUS | Status: AC
  Filled 2023-07-01 (×2): qty 1000

## 2023-07-01 MED ORDER — DIPHENHYDRAMINE HCL 25 MG PO CAPS
25.0000 mg | ORAL_CAPSULE | Freq: Four times a day (QID) | ORAL | Status: DC | PRN
  Administered 2023-07-01: 25 mg via ORAL
  Filled 2023-07-01: qty 1

## 2023-07-01 NOTE — Hospital Course (Addendum)
Ethan Wells is a 52 y.o. male with medical history significant of alcoholic liver cirrhosis with portal gastropathy, s/p of TIPS, superior mesenteric artery stenosis, HTN, HLD, CAD, STEMI, dCHF, GERD, depression with anxiety, adrenal insufficiency (Addison's disease), migraine headache, cardiac arrest due to V-fib, GI bleeding, polysubstance abuse, cocaine abuse, tobacco abuse, who presents with nausea, vomiting, diarrhea, abdominal pain, headache.  Patient is treated symptomatically.  Patient also had a CT scan showed a large liver mass, MRI confirmed but no change since 01/2023.  7/17.  Patient having some abdominal discomfort and nausea did not eat much this morning.  Not feeling well.  Alpha-fetoprotein still pending. 7/18.  Patient still not feeling well.  Not eating very well.  Still having abdominal pain.  Alpha-fetoprotein came back low at 3.2. 7/19.  Patient still with poor appetite and abdominal discomfort 7/20.  Patient not feeling well but okay to go home.  Wanted some Ativan to go home with and also pain medication.

## 2023-07-01 NOTE — Progress Notes (Signed)
Progress Note   [name] ZOX:[id number] DOB: Oct 12, 1971 DOA: 06/30/2023     0 DOS: the patient was seen and examined on 07/01/2023   Brief hospital course: Ethan Wells is a 52 y.o. male with medical history significant of alcoholic liver cirrhosis with portal gastropathy, s/p of TIPS, superior mesenteric artery stenosis, HTN, HLD, CAD, STEMI, dCHF, GERD, depression with anxiety, adrenal insufficiency (Addison's disease), migraine headache, cardiac arrest due to V-fib, GI bleeding, polysubstance abuse, cocaine abuse, tobacco abuse, who presents with nausea, vomiting, diarrhea, abdominal pain, headache.  Patient is treated symptomatically.  Patient also had a CT scan showed a large liver mass, MRI confirmed but no change since 01/2023.   Principal Problem:   Abdominal pain Active Problems:   Nausea vomiting and diarrhea   Adrenal insufficiency (Addison's disease) (HCC)   Liver mass   CAD (coronary artery disease)   HLD (hyperlipidemia)   Chronic diastolic CHF (congestive heart failure) (HCC)   Hypokalemia   Thrombocytopenia (HCC)   Migraine   Essential hypertension   Depression with anxiety   Polysubstance abuse (HCC)   Tobacco abuse   Cocaine abuse (HCC)   Alcohol abuse   Pancytopenia (HCC)   Metabolic acidosis   Assessment and Plan: Abdominal pain and nausea vomiting and diarrhea: Likely gastroenteritis. Patient still has nausea, poor appetite.  But no vomiting.  Still has some abdominal cramping pain. Continue gentle rehydration for total of 1 L.  Hypokalemia. Metabolic acidosis. Secondary to diarrhea, potassium improved after giving KCl.  Start sodium bicarb. Continue IV fluids for total volume 1 L.  Adrenal insufficiency (Addison's disease) (HCC):  -Resume home Cortef 5 mg daily   Liver mass Liver cirrhosis without ascites.:  Suspecting malignancy MRI of the liver showed the same size of liver mass since 01/2023.  Patient need to follow-up  with oncology as outpatient.   CAD (coronary artery disease) Dyslipidemia. Resume Plavix and Crestor   Chronic diastolic CHF (congestive heart failure) (HCC): 2D echo on 04/11/2023 showed EF of 55 to 60% with grade 2 diastolic dysfunction.  No evidence of volume overload.   Thrombocytopenia (HCC) Pancytopenia. Secondary to liver cirrhosis.  Follow.   Migraine -Try as needed sumatriptan   Essential hypertension -IV hydralazine as needed   Depression with anxiety -Seroquel   Polysubstance abuse (HCC), Tobacco abuse, Cocaine abuse (HCC) and Alcohol abuse: Patient on CIWA protocol.  Advised to quit.       Subjective:  Patient still feels nauseous, poor appetite.  Abdominal cramping pain.  Diarrhea has resolved.  Physical Exam: Vitals:   07/01/23 0033 07/01/23 0305 07/01/23 0613 07/01/23 0830  BP: (!) 102/51  (!) 119/57 112/64  Pulse: 82  99 86  Resp: 18  18 18   Temp: 98.4 F (36.9 C)  98 F (36.7 C) 97.7 F (36.5 C)  TempSrc: Oral  Oral   SpO2: 99%  100% 100%  Weight:  84.2 kg     General exam: Appears calm and comfortable  Respiratory system: Clear to auscultation. Respiratory effort normal. Cardiovascular system: S1 & S2 heard, RRR. No JVD, murmurs, rubs, gallops or clicks. No pedal edema. Gastrointestinal system: Abdomen is nondistended, soft and generalized tender. No organomegaly or masses felt. Normal bowel sounds heard. Central nervous system: Alert and oriented. No focal neurological deficits. Extremities: Symmetric 5 x 5 power. Skin: No rashes, lesions or ulcers Psychiatry: Judgement and insight appear normal. Mood & affect appropriate.    Data Reviewed:  Reviewed CT scan, MRI scan, lab  results.  Family Communication: None  Disposition: Status is: Observation      Time spent: 35 minutes  Author: Marrion Coy, MD 07/01/2023 2:58 PM  For on call review [email or web address].

## 2023-07-01 NOTE — Consult Note (Signed)
Hematology/Oncology Consult note [phone] [phone]      Patient Care Team: [name], NP as PCP - General (Internal Medicine) Kathleene Hazel, MD as PCP - Cardiology (Cardiology)   Name of the [name]  [id number]  1971-07-15   REASON FOR COSULTATION:  Liver mass History of presenting illness-  52 y.o. male with PMH listed at below who presents to ER for evaluation of nausea vomiting diarrhea and abdominal pain. Symptom onset a few days ago.  Patient is poor historian.  History was obtained mainly from reviewing previous medical records. He has a history of alcoholic cirrhosis.  06/30/2023, CT abdomen pelvis with contrast showed 1. No acute CT findings of the abdomen or pelvis to explain abdominal pain. 2. Cirrhosis and hepatic steatosis. Masslike, partially exophytic lesion arising from inferior hepatic segment V, adjacent to the gallbladder fossa, measuring 5.7 x 3.9 cm. This is suspicious for hepatocellular carcinoma. Recommend further evaluation with contrast enhanced MRI on a nonemergent, outpatient basis. 3. Right hepatic vein TIPS. 4. Celiac axis and superior mesenteric artery stenting  06/30/2023 MRI abdomen with and without contrast showed 1. Cirrhosis and hepatic steatosis.  Tips shunt in place. 2. Exophytic lesion arising from the inferior aspect of segment 5 adjacent to the gallbladder fossa is again noted as seen on the CT from earlier today. This measures 5.2 x 4.1 cm. This lesion is similar in size to study from 01/17/2023. No signs of arterial phase hyperenhancement. In a patient who is at increased risk for cirrhosis imaging findings are indeterminate compatible with LI-RADS category 3 lesion. Recommend correlation with alpha fetoprotein levels. Additionally, follow-up imaging in 3-6 months with repeat liver protocol MRI to assess for threshold growth. 3. Mild splenomegaly. 4. Vascular stents  identified within the proximal celiac and SMA.  Hematology oncology was recommended for further evaluation of liver mass.  Allergies  Allergen Reactions   Penicillins Hives and Rash    Tolerated Ceftriaxone 10/2022    Patient Active Problem List   Diagnosis Date Noted   Pancytopenia (HCC) 07/01/2023   Metabolic acidosis 07/01/2023   Liver mass 06/30/2023   Nausea vomiting and diarrhea 06/30/2023   Migraine 06/30/2023   Cholecystitis, acute 05/10/2023   UTI (urinary tract infection) 05/05/2023   Chronic diastolic CHF (congestive heart failure) (HCC) 05/05/2023   Alcohol abuse 05/05/2023   Alcoholic cirrhosis (HCC) 04/12/2023   Esophageal varices with bleeding in diseases classified elsewhere (HCC) 04/09/2023   Hepatic encephalopathy (HCC) 04/09/2023   Secondary esophageal varices with bleeding (HCC) 04/08/2023   Upper GI bleeding 04/07/2023   Leg edema 03/01/2023   GI bleeding 02/22/2023   HLD (hyperlipidemia) 02/22/2023   Thrombocytopenia (HCC) 02/22/2023   Adrenal insufficiency (HCC) 02/22/2023   Alcoholic liver disease (HCC) 02/22/2023   Upper GI bleed 02/22/2023   Myocardial injury 01/29/2023   Adrenal insufficiency (Addison's disease) (HCC) 01/28/2023   Reactive thrombocytosis 01/24/2023   Hypophosphatemia 01/22/2023   Acute hypoxic respiratory failure (HCC) 01/20/2023   Orthostatic hypotension 01/17/2023   Anasarca 01/17/2023   Obesity (BMI 30-39.9) 01/17/2023   Cocaine dependence with cocaine-induced disorder (HCC) 01/17/2023   Rectal bleeding 01/17/2023   Peripheral edema 01/17/2023   History of benzodiazepine use 01/17/2023   Chest pain 11/19/2022   Acute on chronic heart failure with preserved ejection fraction (HFpEF) (HCC) 10/29/2022   Acute respiratory distress 10/29/2022   Elevated rheumatoid factor 10/28/2022   Hyperkalemia 10/25/2022   Hemorrhoids, internal, with bleeding 10/25/2022   Mesenteric artery stenosis (  HCC) 10/23/2022   Iron deficiency  anemia 10/23/2022   Hematochezia    Abnormal CT scan, colon    Colitis 09/30/2022   Prolonged QT interval 09/30/2022   Alcohol use disorder 08/19/2022   GI bleed 05/28/2022   Non-intractable vomiting    Epigastric pain    Polyp of ascending colon    Anemia due to GI blood loss 08/07/2021   Acute gastritis    Hyperbilirubinemia    Elevated troponin    Notalgia    History of ST elevation myocardial infarction (STEMI) 12/19/2020   Alcohol dependence (HCC) 12/19/2020   Depression with anxiety 10/05/2020   Esophageal reflux 10/05/2020   Headache 10/05/2020   Hyperlipidemia 10/05/2020   Hyponatremia 06/04/2020   Hypomagnesemia 06/04/2020   Chronic systolic CHF (congestive heart failure) (HCC) 06/04/2020   Leukocytosis 06/04/2020   Abdominal pain 06/04/2020   Hypokalemia 06/04/2020   Ischemic cardiomyopathy    Polysubstance abuse (HCC)    NSTEMI (non-ST elevated myocardial infarction) (HCC) 07/12/2019   Abdominal pain, RUQ 05/25/2018   Chest pain, mid sternal 11/17/2012   Cocaine abuse (HCC)    Marijuana abuse    Tobacco abuse 02/26/2011   Essential hypertension 02/26/2011   CAD (coronary artery disease) 02/26/2011   Other specified forms of chronic ischemic heart disease 02/26/2011     Past Medical History:  Diagnosis Date   CAD (coronary artery disease)    a. 01/2011 Anterior STEMI/Cath/PCI: LM nl, LAD 100d (3.5x7mm Vision BMS placed), LCX 13m, RI 50, RCA min irregs, EF 40% w/ apical, inferoapical HK; b. 10/2022 NSTSEMI/Cath: LM nl, LAD 5p/m, RI 85, LCX 123m, OM1 mild dzs, OM3 100 fills via L->L collats from dLAD, RCA 80p, 155m, RPDA fills via collats from LAD. EF 45-50%-->Med Rx.   Cardiac arrest - ventricular fibrillation    a. In setting of STEMI 01/2011   Chronic pain    Cocaine abuse (HCC)    Depression with anxiety    ETOH abuse    a. 6-12 beers/day   GERD (gastroesophageal reflux disease)    GI bleed    Hemorrhoids    Hyperlipidemia    Hypertension     Ischemic cardiomyopathy    a. 06/2011 Echo: EF 45-50%, No rwma; b. 10/2022 Echo: EF 55-60%, no rwma, nl RV fxn.   Marijuana abuse    Migraines    Tobacco abuse    a. 1/2 ppd x 26 yrs     Past Surgical History:  Procedure Laterality Date   COLONOSCOPY  08/09/2021   Procedure: COLONOSCOPY;  Surgeon: Midge Minium, MD;  Location: [facility] ENDOSCOPY;  Service: Endoscopy;;   CORONARY ANGIOPLASTY WITH STENT PLACEMENT     ESOPHAGEAL BANDING  04/08/2023   Procedure: ESOPHAGEAL BANDING;  Surgeon: Tressia Danas, MD;  Location: [facility] ENDOSCOPY;  Service: Gastroenterology;;   ESOPHAGOGASTRODUODENOSCOPY N/A 05/30/2022   Procedure: ESOPHAGOGASTRODUODENOSCOPY (EGD);  Surgeon: Toney Reil, MD;  Location: [facility] ENDOSCOPY;  Service: Gastroenterology;  Laterality: N/A;   ESOPHAGOGASTRODUODENOSCOPY (EGD) WITH PROPOFOL N/A 03/01/2021   Procedure: ESOPHAGOGASTRODUODENOSCOPY (EGD) WITH PROPOFOL;  Surgeon: Toney Reil, MD;  Location: [facility] SURGERY CNTR;  Service: Endoscopy;  Laterality: N/A;   ESOPHAGOGASTRODUODENOSCOPY (EGD) WITH PROPOFOL N/A 08/09/2021   Procedure: ESOPHAGOGASTRODUODENOSCOPY (EGD) WITH PROPOFOL;  Surgeon: Midge Minium, MD;  Location: [facility] ENDOSCOPY;  Service: Endoscopy;  Laterality: N/A;   ESOPHAGOGASTRODUODENOSCOPY (EGD) WITH PROPOFOL N/A 03/01/2023   Procedure: ESOPHAGOGASTRODUODENOSCOPY (EGD) WITH PROPOFOL;  Surgeon: Jaynie Collins, DO;  Location: [facility] ENDOSCOPY;  Service: Gastroenterology;  Laterality: N/A;  ESOPHAGOGASTRODUODENOSCOPY (EGD) WITH PROPOFOL N/A 04/08/2023   Procedure: ESOPHAGOGASTRODUODENOSCOPY (EGD) WITH PROPOFOL;  Surgeon: Tressia Danas, MD;  Location: [facility] ENDOSCOPY;  Service: Gastroenterology;  Laterality: N/A;   EVALUATION UNDER ANESTHESIA WITH HEMORRHOIDECTOMY N/A 10/31/2022   Procedure: EXAM UNDER ANESTHESIA WITH SUTURE LIGATION OF TWO BLEEDING PEDICLES;  Surgeon: Henrene Dodge, MD;  Location: [facility] ORS;  Service: General;  Laterality: N/A;   FLEXIBLE  SIGMOIDOSCOPY N/A 10/02/2022   Procedure: FLEXIBLE SIGMOIDOSCOPY;  Surgeon: Meryl Dare, MD;  Location: [facility] ENDOSCOPY;  Service: Gastroenterology;  Laterality: N/A;   IR TIPS  04/11/2023   IR US GUIDE VASC ACCESS RIGHT  04/11/2023   IR US GUIDE VASC ACCESS RIGHT  04/11/2023   IR US GUIDE VASC ACCESS RIGHT  04/11/2023   LEFT HEART CATH AND CORONARY ANGIOGRAPHY N/A 11/04/2022   Procedure: LEFT HEART CATH AND CORONARY ANGIOGRAPHY;  Surgeon: Iran Ouch, MD;  Location: [facility] INVASIVE CV LAB;  Service: Cardiovascular;  Laterality: N/A;   RADIOLOGY WITH ANESTHESIA N/A 04/11/2023   Procedure: IR WITH ANESTHESIA;  Surgeon: Oley Balm, MD;  Location: [facility] OR;  Service: Radiology;  Laterality: N/A;   VISCERAL ANGIOGRAPHY N/A 10/24/2022   Procedure: VISCERAL ANGIOGRAPHY;  Surgeon: Annice Needy, MD;  Location: [facility] INVASIVE CV LAB;  Service: Cardiovascular;  Laterality: N/A;   VISCERAL ARTERY INTERVENTION N/A 05/09/2023   Procedure: VISCERAL ARTERY INTERVENTION;  Surgeon: Renford Dills, MD;  Location: [facility] INVASIVE CV LAB;  Service: Cardiovascular;  Laterality: N/A;    Social History   Socioeconomic History   Marital status: Single    Spouse name: Not on file   Number of children: Not on file   Years of education: Not on file   Highest education level: Not on file  Occupational History   Not on file  Tobacco Use   Smoking status: Every Day    Current packs/day: 0.50    Average packs/day: 0.5 packs/day for 41.0 years (20.5 ttl pk-yrs)    Types: Cigarettes   Smokeless tobacco: Never   Tobacco comments:    started age 65  Vaping Use   Vaping status: Never Used  Substance and Sexual Activity   Alcohol use: Yes    Alcohol/week: 12.0 standard drinks of alcohol    Types: 12 Cans of beer per week    Comment: at least a 6 pack of beer daily and often a 12 pack   Drug use: Yes    Types: Cocaine, Marijuana   Sexual activity: Not on file  Other Topics Concern   Not on file  Social  History Narrative   Single, lives in [place] with his mother.  He does not routinely exercise.  He sometimes works, helping to deliver appliances to homes.   Social Determinants of Health   Financial Resource Strain: Not on file  Food Insecurity: No Food Insecurity (06/30/2023)   Hunger Vital Sign    Worried About Running Out of Food in the Last Year: Never true    Ran Out of Food in the Last Year: Never true  Transportation Needs: No Transportation Needs (06/30/2023)   PRAPARE - Administrator, Civil Service (Medical): No    Lack of Transportation (Non-Medical): No  Physical Activity: Not on file  Stress: Not on file  Social Connections: Not on file  Intimate Partner Violence: Not At Risk (06/30/2023)   Humiliation, Afraid, Rape, and Kick questionnaire    Fear of Current or Ex-Partner: No    Emotionally Abused: No  Physically Abused: No    Sexually Abused: No     Family History  Problem Relation Age of Onset   Coronary artery disease Mother        alive   Other Other        2 sisters alive and well   Alcohol abuse Father        died @ 21   Other Paternal Grandmother        PACER     Current Facility-Administered Medications:    clopidogrel (PLAVIX) tablet 75 mg, 75 mg, Oral, Daily, Lorretta Harp, MD, 75 mg at 07/01/23 8119   diphenhydrAMINE (BENADRYL) capsule 25 mg, 25 mg, Oral, Q6H PRN, Marrion Coy, MD, 25 mg at 07/01/23 1042   folic acid (FOLVITE) tablet 1 mg, 1 mg, Oral, Daily, Lorretta Harp, MD, 1 mg at 07/01/23 1478   gabapentin (NEURONTIN) capsule 100 mg, 100 mg, Oral, BID, Lorretta Harp, MD, 100 mg at 07/01/23 2956   hydrALAZINE (APRESOLINE) injection 5 mg, 5 mg, Intravenous, Q2H PRN, Lorretta Harp, MD   hydrocortisone (CORTEF) tablet 5 mg, 5 mg, Oral, QPC lunch, Lorretta Harp, MD, 5 mg at 07/01/23 1340   lactated ringers 1,000 mL with potassium chloride 20 mEq infusion, , Intravenous, Continuous, Marrion Coy, MD, Last Rate: 100 mL/hr at 07/01/23 1152, New Bag at  07/01/23 1152   LORazepam (ATIVAN) injection 0-4 mg, 0-4 mg, Intravenous, Q6H **FOLLOWED BY** [START ON 07/02/2023] LORazepam (ATIVAN) injection 0-4 mg, 0-4 mg, Intravenous, Q12H, Lorretta Harp, MD   LORazepam (ATIVAN) tablet 1-4 mg, 1-4 mg, Oral, Q1H PRN, 1 mg at 06/30/23 2113 **OR** LORazepam (ATIVAN) injection 1-4 mg, 1-4 mg, Intravenous, Q1H PRN, Lorretta Harp, MD   methocarbamol (ROBAXIN) tablet 750 mg, 750 mg, Oral, BID PRN, Lorretta Harp, MD   multivitamin with minerals tablet 1 tablet, 1 tablet, Oral, Daily, Lorretta Harp, MD, 1 tablet at 07/01/23 2130   nicotine (NICODERM CQ - dosed in mg/24 hours) patch 21 mg, 21 mg, Transdermal, Daily, Lorretta Harp, MD, 21 mg at 07/01/23 0842   nitroGLYCERIN (NITROSTAT) SL tablet 0.4 mg, 0.4 mg, Sublingual, Q5 min PRN, Lorretta Harp, MD   ondansetron [facility]) injection 4 mg, 4 mg, Intravenous, Q8H PRN, Lorretta Harp, MD   oxyCODONE (Oxy IR/ROXICODONE) immediate release tablet 5 mg, 5 mg, Oral, Q6H PRN, Lorretta Harp, MD, 5 mg at 07/01/23 1538   pantoprazole (PROTONIX) EC tablet 40 mg, 40 mg, Oral, BID, Lorretta Harp, MD, 40 mg at 07/01/23 0842   QUEtiapine (SEROQUEL) tablet 50 mg, 50 mg, Oral, QHS, Lorretta Harp, MD, 50 mg at 06/30/23 2104   rifaximin (XIFAXAN) tablet 550 mg, 550 mg, Oral, BID, Lorretta Harp, MD, 550 mg at 07/01/23 0842   rosuvastatin (CRESTOR) tablet 40 mg, 40 mg, Oral, Daily, Lorretta Harp, MD, 40 mg at 07/01/23 8657   sodium bicarbonate tablet 1,300 mg, 1,300 mg, Oral, BID, Marrion Coy, MD, 1,300 mg at 07/01/23 1538   SUMAtriptan (IMITREX) injection 6 mg, 6 mg, Subcutaneous, Q2H PRN, Lorretta Harp, MD, 6 mg at 06/30/23 2114   thiamine (VITAMIN B1) tablet 100 mg, 100 mg, Oral, Daily, 100 mg at 07/01/23 0842 **OR** thiamine (VITAMIN B1) injection 100 mg, 100 mg, Intravenous, Daily, Lorretta Harp, MD  Review of Systems  Constitutional:  Positive for appetite change and fatigue. Negative for chills and fever.  HENT:   Negative for hearing loss and voice change.   Eyes:   Negative for eye problems and icterus.  Respiratory:  Negative for chest tightness, cough  and shortness of breath.   Cardiovascular:  Negative for chest pain.  Gastrointestinal:  Positive for abdominal pain, diarrhea and nausea. Negative for abdominal distention.  Endocrine: Negative for hot flashes.  Genitourinary:  Negative for dysuria.   Musculoskeletal:  Negative for arthralgias.  Skin:  Negative for itching and rash.  Neurological:  Negative for light-headedness.  Hematological:  Negative for adenopathy.  Psychiatric/Behavioral:  Negative for confusion.     PHYSICAL EXAM Vitals:   07/01/23 0305 07/01/23 0613 07/01/23 0830 07/01/23 1629  BP:  (!) 119/57 112/64 123/68  Pulse:  99 86 98  Resp:  18 18 20   Temp:  98 F (36.7 C) 97.7 F (36.5 C) 98.6 F (37 C)  TempSrc:  Oral  Oral  SpO2:  100% 100% 99%  Weight: 185 lb 10 oz (84.2 kg)      Physical Exam Constitutional:      General: He is not in acute distress. HENT:     Head: Normocephalic and atraumatic.  Eyes:     General: No scleral icterus. Cardiovascular:     Rate and Rhythm: Normal rate and regular rhythm.     Heart sounds: No murmur heard. Pulmonary:     Effort: Pulmonary effort is normal. No respiratory distress.  Abdominal:     General: There is no distension.     Palpations: Abdomen is soft.     Tenderness: There is abdominal tenderness.  Musculoskeletal:        General: Normal range of motion.     Cervical back: Normal range of motion and neck supple.  Skin:    General: Skin is warm and dry.     Findings: No erythema.  Neurological:     Mental Status: He is alert.     Cranial Nerves: No cranial nerve deficit.     Motor: No abnormal muscle tone.     Comments: Lethargic  Psychiatric:        Mood and Affect: Affect normal.       LABORATORY STUDIES    Latest Ref Rng & Units 07/01/2023    4:43 AM 06/30/2023    1:00 PM 05/30/2023    4:27 PM  CBC  WBC 4.0 - 10.5 K/uL 2.3  5.6  6.7   Hemoglobin 13.0  - 17.0 g/dL 9.0  16.0  10.9   Hematocrit 39.0 - 52.0 % 26.6  30.0  32.0   Platelets 150 - 400 K/uL 73  112  154       Latest Ref Rng & Units 07/01/2023    1:55 PM 07/01/2023    4:43 AM 06/30/2023    1:00 PM  CMP  Glucose 70 - 99 mg/dL 323  95  557   BUN 6 - 20 mg/dL <5  <5  <5   Creatinine 0.61 - 1.24 mg/dL 3.22  0.25  4.27   Sodium 135 - 145 mmol/L 135  139  136   Potassium 3.5 - 5.1 mmol/L 4.0  3.3  2.5   Chloride 98 - 111 mmol/L 111  112  106   CO2 22 - 32 mmol/L 19  21  20    Calcium 8.9 - 10.3 mg/dL 8.2  7.7  8.1   Total Protein 6.5 - 8.1 g/dL  6.1  6.8   Total Bilirubin 0.3 - 1.2 mg/dL  3.0  3.4   Alkaline Phos 38 - 126 U/L  250  289   AST 15 - 41 U/L  90  99   ALT 0 -  44 U/L  22  26      RADIOGRAPHIC STUDIES: I have personally reviewed the radiological images as listed and agreed with the findings in the report. MR ABDOMEN W WO CONTRAST  Result Date: 07/01/2023 CLINICAL DATA:  52 year old male with cirrhosis. Evaluate for hepatocellular carcinoma. EXAM: MRI ABDOMEN WITHOUT AND WITH CONTRAST TECHNIQUE: Multiplanar multisequence MR imaging of the abdomen was performed both before and after the administration of intravenous contrast. CONTRAST:  8mL GADAVIST GADOBUTROL 1 MMOL/ML IV SOLN COMPARISON:  CT AP 06/30/2023. FINDINGS: Lower chest: No acute findings. Hepatobiliary: The liver has a nodular contour with relative hypertrophy of the caudate lobe and lateral segment of left hepatic lobe. There is diffuse hepatic steatosis. A tips shunt is identified. Motion artifact diminishes exam detail on the postcontrast images. The arterial phase images are suboptimal due to timing of the contrast bolus. T2 isointense, and slightly T1 hyperintense exophytic lesion arising from the inferior aspect of segment 5 adjacent to the gallbladder fossa is again noted as seen on the CT from earlier today. This measures 5.2 x 4.1 cm, image 56/15. On the arterial phase images this is slightly hypointense to  the adjacent liver parenchyma. On the delayed images this lesion remains isointense to slightly hypointense to the adjacent liver. Comparison with study from 09/30/2022 shows a similar sized abnormality without signs of arterial phase hyperenhancement. On 01/17/2023 this measured the same as today's study. No gallstones. Small amount of fluid is identified adjacent to the fundus of the gallbladder. No gallstones or gallbladder wall dilatation. Pancreas: No mass, inflammatory changes, or other parenchymal abnormality identified. Spleen: Spleen measures 14 cm in cranial caudal dimension. No focal splenic lesion. Adrenals/Urinary Tract: No masses identified. No evidence of hydronephrosis. Stomach/Bowel: Visualized portions within the abdomen are unremarkable. Vascular/Lymphatic: Vascular stents identified within the proximal celiac and SMA. Extrahepatic portal vein, portal venous confluence and SMV appear patent. Normal caliber abdominal aorta. Aortic atherosclerotic calcifications. No significant arthropathy. Other:  No significant ascites or focal fluid collections. Musculoskeletal: No suspicious bone lesions identified. IMPRESSION: 1. Cirrhosis and hepatic steatosis.  Tips shunt in place. 2. Exophytic lesion arising from the inferior aspect of segment 5 adjacent to the gallbladder fossa is again noted as seen on the CT from earlier today. This measures 5.2 x 4.1 cm. This lesion is similar in size to study from 01/17/2023. No signs of arterial phase hyperenhancement. In a patient who is at increased risk for cirrhosis imaging findings are indeterminate compatible with LI-RADS category 3 lesion. Recommend correlation with alpha fetoprotein levels. Additionally, follow-up imaging in 3-6 months with repeat liver protocol MRI to assess for threshold growth. 3. Mild splenomegaly. 4. Vascular stents identified within the proximal celiac and SMA. Electronically Signed   By: Signa Kell M.D.   On: 07/01/2023 08:26   CT  ABDOMEN PELVIS W CONTRAST  Result Date: 06/30/2023 CLINICAL DATA:  Abdominal pain, alcohol * Tracking Code: BO * EXAM: CT ABDOMEN AND PELVIS WITH CONTRAST TECHNIQUE: Multidetector CT imaging of the abdomen and pelvis was performed using the standard protocol following bolus administration of intravenous contrast. RADIATION DOSE REDUCTION: This exam was performed according to the departmental dose-optimization program which includes automated exposure control, adjustment of the mA and/or kV according to patient size and/or use of iterative reconstruction technique. CONTRAST:  OMNIPAQUE IOHEXOL 300 MG/ML  SOLN COMPARISON:  CT chest abdomen pelvis, 05/09/2023 FINDINGS: Lower chest: No acute abnormality. Hepatobiliary: Hepatic steatosis. Coarse, nodular contour of the liver with a severely heterogeneous parenchymal  background. Masslike, partially exophytic lesion arising from inferior hepatic segment V, adjacent to the gallbladder fossa, measuring 5.7 x 3.9 cm (series 2, image 36). Right hepatic vein TIPS. Contracted gallbladder. No gallstones or biliary ductal dilatation. Pancreas: Unremarkable. No pancreatic ductal dilatation or surrounding inflammatory changes. Spleen: Normal in size without significant abnormality. Adrenals/Urinary Tract: Adrenal glands are unremarkable. Kidneys are normal, without renal calculi, solid lesion, or hydronephrosis. Bladder is unremarkable. Stomach/Bowel: Stomach is within normal limits. Appendix not clearly visualized. No evidence of bowel wall thickening, distention, or inflammatory changes. Vascular/Lymphatic: Aortic atherosclerosis. Celiac axis and superior mesenteric artery stenting. No enlarged abdominal or pelvic lymph nodes. Reproductive: No mass or other significant abnormality. Other: No abdominal wall hernia or abnormality. No ascites. Musculoskeletal: No acute or significant osseous findings. IMPRESSION: 1. No acute CT findings of the abdomen or pelvis to explain  abdominal pain. 2. Cirrhosis and hepatic steatosis. Masslike, partially exophytic lesion arising from inferior hepatic segment V, adjacent to the gallbladder fossa, measuring 5.7 x 3.9 cm. This is suspicious for hepatocellular carcinoma. Recommend further evaluation with contrast enhanced MRI on a nonemergent, outpatient basis. 3. Right hepatic vein TIPS. 4. Celiac axis and superior mesenteric artery stenting. Aortic Atherosclerosis (ICD10-I70.0). Electronically Signed   By: Jearld Lesch M.D.   On: 06/30/2023 15:21   CT Head Wo Contrast  Result Date: 06/30/2023 CLINICAL DATA:  Provided history: Headache, increasing frequency or severity. EXAM: CT HEAD WITHOUT CONTRAST TECHNIQUE: Contiguous axial images were obtained from the base of the skull through the vertex without intravenous contrast. RADIATION DOSE REDUCTION: This exam was performed according to the departmental dose-optimization program which includes automated exposure control, adjustment of the mA and/or kV according to patient size and/or use of iterative reconstruction technique. COMPARISON:  Prior head CT examinations 05/05/2023 and earlier. FINDINGS: Brain: Mild cerebral atrophy. There is no acute intracranial hemorrhage. No demarcated cortical infarct. No extra-axial fluid collection. No evidence of an intracranial mass. No midline shift. Vascular: No hyperdense vessel.  Atherosclerotic calcifications. Skull: No calvarial fracture or aggressive osseous lesion. Sinuses/Orbits: No mass or acute finding within the imaged orbits. No significant paranasal sinus disease at the imaged levels. IMPRESSION: 1.  No evidence of an acute intracranial abnormality. 2. Mild cerebral atrophy. Electronically Signed   By: Jackey Loge D.O.   On: 06/30/2023 15:18   US ABDOMEN LIMITED RUQ (LIVER/GB)  Result Date: 05/30/2023 CLINICAL DATA:  Right upper quadrant abdominal pain. EXAM: ULTRASOUND ABDOMEN LIMITED RIGHT UPPER QUADRANT COMPARISON:  Ultrasound dated  05/09/2023. chest CT of 05/09/2023. FINDINGS: Gallbladder: No shadowing stone identified. There is slight thickening of the gallbladder wall measuring up to 5 mm, possibly secondary to underlying liver disease and improved since the prior CT. No pericholecystic fluid. Positive sonographic Murphy's sign reported. Common bile duct: Diameter: 4 mm Liver: Morphologic changes of cirrhosis. A TIPS is seen. Portal vein is patent on color Doppler imaging with normal direction of blood flow towards the liver. Other: None. IMPRESSION: 1. No gallstone. 2. Cirrhosis. 3. Patent main portal vein with hepatopetal flow. Electronically Signed   By: Elgie Collard M.D.   On: 05/30/2023 19:08   DG Chest 2 View  Result Date: 05/30/2023 CLINICAL DATA:  Chest pain EXAM: CHEST - 2 VIEW COMPARISON:  CT 05/09/2023 and x-ray FINDINGS: Hyperinflation. Normal cardiopericardial silhouette when adjusting for technique. No consolidation, pneumothorax or effusion. No edema. IMPRESSION: Hyperinflation.  No acute cardiopulmonary disease Electronically Signed   By: Karen Kays M.D.   On: 05/30/2023 17:14  [place] EKG SITE RITE  Result Date: 05/10/2023 If [facility] image not attached, placement could not be confirmed due to current cardiac rhythm.  US Abdomen Limited RUQ (LIVER/GB)  Result Date: 05/09/2023 CLINICAL DATA:  [id number] Cholecystitis [id number] EXAM: ULTRASOUND ABDOMEN LIMITED RIGHT UPPER QUADRANT COMPARISON:  None Available.  CT abdomen pelvis 05/09/2023 FINDINGS: Gallbladder: Marked gallbladder wall thickening (measuring up to 9 mm) and hyperemia. Gallbladder sludge and gallstones noted within the gallbladder lumen. Question trace pericholecystic fluid. Positive sonographic Murphy sign noted by sonographer. Common bile duct: Diameter: 5 mm. Liver: Nodular hepatic contour. No focal lesion identified. Patent tips. Within normal limits in parenchymal echogenicity. Portal vein is patent on color Doppler imaging with normal direction of  blood flow towards the liver. Other: None. IMPRESSION: 1. Cholelithiasis and gallbladder sludge with associated acute cholecystitis. 2. Cirrhosis with patent tips. No focal liver lesions identified. Please note that liver protocol enhanced MR and CT are the most sensitive tests for the screening detection of hepatocellular carcinoma in the high risk setting of cirrhosis. Electronically Signed   By: Tish Frederickson M.D.   On: 05/09/2023 19:43   CT CHEST ABDOMEN PELVIS WO CONTRAST  Result Date: 05/09/2023 CLINICAL DATA:  Abdominal pain. Status post revascularization of SMA and celiac artery with stenting. EXAM: CT CHEST, ABDOMEN AND PELVIS WITHOUT CONTRAST TECHNIQUE: Multidetector CT imaging of the chest, abdomen and pelvis was performed following the standard protocol without IV contrast. RADIATION DOSE REDUCTION: This exam was performed according to the departmental dose-optimization program which includes automated exposure control, adjustment of the mA and/or kV according to patient size and/or use of iterative reconstruction technique. COMPARISON:  05/05/2023 FINDINGS: CT CHEST FINDINGS Cardiovascular: Heart size appears normal. Aortic atherosclerosis and coronary artery calcifications. No pericardial effusion identified. Mediastinum/Nodes: Thyroid gland, trachea, and esophagus are unremarkable. No enlarged mediastinal or axillary lymph nodes. Lungs/Pleura: Mild thickening along the posterior pleura overlying the lower lobes. No significant pleural effusion, airspace consolidation or pneumothorax. Mild to moderate emphysema. Scarring identified within the lingula and posterior lung bases. Bilateral peripheral areas of interstitial reticulation and subpleural banding noted which likely reflects chronic postinflammatory change. Stable subpleural nodule within the anterior right upper lobe measuring 4 mm, image 56/5. Subpleural nodule within the anterior right middle lobe is also unchanged measuring 5 mm.  Musculoskeletal: Subacute to chronic right 6 anterior rib fracture, image 96/5. Possible acute fracture involving the anterior aspect of the right fifth rib, image 83/5. Remote left anterior third and fourth fractures identified. CT ABDOMEN PELVIS FINDINGS Hepatobiliary: Morphologic features of cirrhosis identified within the liver. Status post tips. No focal liver lesion identified. There is diffuse gallbladder wall thickening with surrounding inflammation. Gallbladder wall thickness measures up to 8 mm. No calcified gallstones identified. No signs of bile duct dilatation. Pancreas: Unremarkable. No pancreatic ductal dilatation or surrounding inflammatory changes. Spleen: Spleen measures 14.9 cm in cranial caudal dimension. No focal abnormality. Adrenals/Urinary Tract: Normal adrenal glands. Contrast material noted within the collecting system of both kidneys. No hydronephrosis or mass. Urinary bladder is filled with contrast material from procedure performed earlier today. No focal bladder abnormality. Stomach/Bowel: Stomach appears within normal limits. Small bowel loops are mildly dilated measuring up to 3.1 cm. No abrupt caliber change. Gradual decrease caliber of the small bowel loops noted distally. There is a moderate stool burden identified within the colon concerning for constipation. The appendix is not confidently identified. Vascular/Lymphatic: Aortic atherosclerosis. Stents identified within the celiac and SMA. No abdominopelvic adenopathy. Reproductive: Prostate is unremarkable.  Other: No significant free fluid or fluid collections. Postprocedural change identified within the right groin region. Musculoskeletal: No acute or significant osseous findings. IMPRESSION: 1. Diffuse gallbladder wall thickening and inflammation. Findings are concerning for acute cholecystitis. Consider further evaluation with right upper quadrant ultrasound. 2. Mild dilatation of small bowel loops without abrupt caliber  change. Gradual decrease caliber of the small bowel loops noted distally. Findings are favored to represent ileus. 3. Moderate stool burden identified within the colon concerning for constipation. 4. Subacute to chronic right 6th anterior rib fracture. Possible acute fracture involving the anterior aspect of the right fifth rib. 5. Cirrhosis with splenomegaly. Status post TIPS. 6.  Aortic Atherosclerosis (ICD10-I70.0). Electronically Signed   By: Signa Kell M.D.   On: 05/09/2023 18:44   DG Chest Port 1 View  Result Date: 05/09/2023 CLINICAL DATA:  Chest pain. EXAM: PORTABLE CHEST 1 VIEW COMPARISON:  May 04, 2023. FINDINGS: Mild cardiomegaly. Both lungs are clear. The visualized skeletal structures are unremarkable. IMPRESSION: No active disease. Electronically Signed   By: Lupita Raider M.D.   On: 05/09/2023 16:08   PERIPHERAL VASCULAR CATHETERIZATION  Result Date: 05/09/2023 See surgical note for result.  MR LUMBAR SPINE WO CONTRAST  Result Date: 05/07/2023 CLINICAL DATA:  Worsening low back pain with difficulty walking EXAM: MRI LUMBAR SPINE WITHOUT CONTRAST TECHNIQUE: Multiplanar, multisequence MR imaging of the lumbar spine was performed. No intravenous contrast was administered. COMPARISON:  12/08/2020 FINDINGS: Segmentation:  Standard. Alignment:  Physiologic. Vertebrae:  No fracture, evidence of discitis, or bone lesion. Conus medullaris and cauda equina: Conus extends to the L1 level. Conus and cauda equina appear normal. Paraspinal and other soft tissues: Negative. Disc levels: L1-L2: Normal disc space and facet joints. No spinal canal stenosis. No neural foraminal stenosis. L2-L3: Normal disc space and facet joints. No spinal canal stenosis. No neural foraminal stenosis. L3-L4: Small disc bulge. No spinal canal stenosis. No neural foraminal stenosis. L4-L5: Unchanged small central disc protrusion. No spinal canal stenosis. No neural foraminal stenosis. L5-S1: Small central disc  protrusion. No spinal canal stenosis. Mild left neural foraminal stenosis. Visualized sacrum: Normal. IMPRESSION: 1. Unchanged small central disc protrusions at L4-L5 and L5-S1 without spinal canal stenosis. 2. Mild left L5-S1 neural foraminal stenosis. Electronically Signed   By: Deatra Robinson M.D.   On: 05/07/2023 23:19   DG Lumbar Spine 2-3 Views  Result Date: 05/06/2023 CLINICAL DATA:  Low back pain. EXAM: LUMBAR SPINE - 2-3 VIEW COMPARISON:  CT abdomen pelvis-05/05/2023 FINDINGS: There are 5 non rib-bearing lumbar type vertebral bodies. Normal alignment of the lumbar spine. No anterolisthesis or retrolisthesis. Lumbar vertebral body heights appear preserved. Mild to moderate multilevel lumbar spine DDD, worse at L3-L4 and L4-L5 with disc space height loss, endplate irregularity and sclerosis. Limited visualization of the bilateral SI joints is normal. TIPS stent overlies the right upper abdominal quadrant. Vascular stent overlies the expected location of the SMA IMPRESSION: Mild-to-moderate multilevel lumbar spine DDD, worse at L3-L4 and L4-L5. Electronically Signed   By: Simonne Come M.D.   On: 05/06/2023 10:04   US ABDOMEN LIMITED RUQ (LIVER/GB)  Result Date: 05/05/2023 CLINICAL DATA:  Right upper quadrant abdominal pain EXAM: ULTRASOUND ABDOMEN LIMITED RIGHT UPPER QUADRANT COMPARISON:  None Available. FINDINGS: Gallbladder: The gallbladder is mildly distended and a small amount of layering sludge is seen within the gallbladder. However, there is no gallbladder wall thickening and no pericholecystic fluid is identified. The sonographic Eulah Pont sign is reportedly NEGATIVE. Common bile duct: Diameter: 4  mm in proximal diameter Liver: Subtle contour nodularity of the liver is seen in keeping with cirrhosis. Tips shunt is partially visualized within the right hepatic lobe. No focal intrahepatic mass is identified. No intrahepatic biliary ductal dilation. Portal vein is patent on color Doppler imaging with  normal direction of blood flow towards the liver. There is antegrade flow within the proximal portion of the shunt and retrograde flow within the left portal vein, expected following shunt placement. Other: No ascites IMPRESSION: 1. Gallbladder sludge without sonographic evidence of acute cholecystitis. 2. Hepatic cirrhosis. 3. Patent portal vein with normal direction of blood flow. There is retrograde flow within the left portal vein, expected following shunt placement. Electronically Signed   By: Helyn Numbers M.D.   On: 05/05/2023 04:03   CT ABDOMEN PELVIS W CONTRAST  Result Date: 05/05/2023 CLINICAL DATA:  Abdominal pain, acute, nonlocalized EXAM: CT ABDOMEN AND PELVIS WITH CONTRAST TECHNIQUE: Multidetector CT imaging of the abdomen and pelvis was performed using the standard protocol following bolus administration of intravenous contrast. RADIATION DOSE REDUCTION: This exam was performed according to the departmental dose-optimization program which includes automated exposure control, adjustment of the mA and/or kV according to patient size and/or use of iterative reconstruction technique. CONTRAST:  OMNIPAQUE IOHEXOL 300 MG/ML  SOLN COMPARISON:  09/30/2022 FINDINGS: Lower chest: No acute abnormality. Stable cardiomegaly. There is circumferential thickening of the distal esophagus which appears stable since prior examination, poss fleck ting changes of esophagitis. Hepatobiliary: Right portal vein-hepatic vein tips shunt is in place extending from the portal bifurcation to the hepatocaval junction. The shunt is patent. Mild contour nodularity of the liver is seen in keeping with changes of cirrhosis. No enhancing intrahepatic mass. The gallbladder is distended and there is trace pericholecystic inflammatory stranding which may reflect changes of acute cholecystitis. No intra or extrahepatic biliary ductal dilation. Pancreas: Unremarkable Spleen: Stable mild splenomegaly with the spleen measuring 13.9  cm in greatest dimension. No intrasplenic lesions are seen. Splenic vein is patent. Adrenals/Urinary Tract: Adrenal glands are unremarkable. Kidneys are normal, without renal calculi, focal lesion, or hydronephrosis. Bladder is unremarkable. Stomach/Bowel: The stomach, small bowel, and large bowel are unremarkable. The appendix is not clearly identified and is likely absent. No free intraperitoneal gas or fluid. Vascular/Lymphatic: Superior mesenteric artery stenting has been performed with the luminal diameter of the stented segment measuring 2 mm in diameter with small amount of neointimal hyperplasia noted. The vessel distally remains patent. Celiac axis demonstrates a 50-75% narrowing proximally secondary to mass effect from the median arcuate ligament. Inferior mesenteric artery is widely patent. Mild aortoiliac atherosclerotic calcification. No aortic aneurysm. Reproductive: Prostate is unremarkable. Other: No abdominal wall hernia Musculoskeletal: No acute bone abnormality. IMPRESSION: 1. Distended gallbladder with trace pericholecystic inflammatory stranding which may reflect changes of acute cholecystitis. Correlation with clinical examination and liver enzymes is recommended. If indicated, this would be better assessed with dedicated right upper quadrant sonography. 2. Morphologic changes in keeping with cirrhosis and portal venous hypertension with mild splenomegaly. Patent TIPS shunt. 3. Interval superior mesenteric artery stenting with luminal diameter of 2 mm with small amount of neointimal hyperplasia noted. The vessel distally remains patent. Given findings within the celiac axis below, clinical correlation for signs and symptoms of chronic mesenteric ischemia is recommended. Endovascular revision of the stented segment may be warranted. 4. 50-75% narrowing of the proximal celiac axis secondary to mass effect from the median arcuate ligament. 5. Circumferential thickening of the distal esophagus,  stable since prior examination,  possibly reflecting changes of esophagitis. 6. Stable cardiomegaly. Aortic Atherosclerosis (ICD10-I70.0). Electronically Signed   By: Helyn Numbers M.D.   On: 05/05/2023 02:42   CT Head Wo Contrast  Result Date: 05/05/2023 CLINICAL DATA:  Altered mental status. EXAM: CT HEAD WITHOUT CONTRAST TECHNIQUE: Contiguous axial images were obtained from the base of the skull through the vertex without intravenous contrast. RADIATION DOSE REDUCTION: This exam was performed according to the departmental dose-optimization program which includes automated exposure control, adjustment of the mA and/or kV according to patient size and/or use of iterative reconstruction technique. COMPARISON:  Head CT dated 09/30/2022. FINDINGS: Brain: Mild age-related atrophy and chronic microvascular ischemic changes. There is no acute intracranial hemorrhage. No mass effect or midline shift. No extra-axial fluid collection. Vascular: No hyperdense vessel or unexpected calcification. Skull: Normal. Negative for fracture or focal lesion. Sinuses/Orbits: No acute finding. Other: None IMPRESSION: 1. No acute intracranial pathology. 2. Mild age-related atrophy and chronic microvascular ischemic changes. Electronically Signed   By: Elgie Collard M.D.   On: 05/05/2023 02:28   DG Chest 2 View  Result Date: 05/04/2023 CLINICAL DATA:  Chest pain. EXAM: CHEST - 2 VIEW COMPARISON:  Chest radiograph dated 04/16/2023. FINDINGS: No focal consolidation, pleural effusion, or pneumothorax. Mild cardiomegaly. No acute osseous pathology. IMPRESSION: 1. No active cardiopulmonary disease. 2. Mild cardiomegaly. Electronically Signed   By: Elgie Collard M.D.   On: 05/04/2023 23:41   DG Swallowing Func-Speech Pathology  Result Date: 04/21/2023 Table formatting from the original result was not included. Modified Barium Swallow Study Patient Details Name: JURON VORHEES MRN: [id number] Date of Birth: 1971/10/02 Today's  Date: 04/21/2023 HPI/PMH: HPI: Patient is a 52 y.o. male with PMH: recurrent GI bleed with known variceal bleed, ischemic cardiomyopathy, ETOH abuse, polysubstance abuse, HTN, HLD, depression and anxiety. He presented to [facility] ED on 04/07/23 with acute GI bleed. He was admitted previous month for GI bleed and TIPS procedure recommended at that time but patient declined it. In ED, patient was hypotensive, mildly tachycardic. EGD completed 4/23. He was intubated 4/23 for flash pulmonary edema and extubated 4/25. He was reintubated on 4/27 for TIPS procedure but unable to be extubated following procedure. On 4/30 NG was placed and patient was extubated.  Pt has undergone BSE with recommendation for NPO - Clinical Impression: Clinical Impression: Pt presented with functional oropharyngeal swallow with adequate oral control of bolus, reliable laryngeal vestibule closure with no penetration/aspiration, normal pharyngeal stripping with no residue post-swallow, and normal ability to manage swallowing a pill with thin liquid with no aspiration and  transfer of pill through esophagus. Recommend starting a regular diet, thin liquids; meds in liquid. Mental status will be the primary barrier to adequate PO intake. SLP will f/u x1 to ensure functionality during mealtime. D/W RD, RN, MD. Factors that may increase risk of adverse event in presence of aspiration Rubye Oaks & Clearance Coots 2021): Factors that may increase risk of adverse event in presence of aspiration Rubye Oaks & Clearance Coots 2021): Reduced cognitive function Recommendations/Plan: Swallowing Evaluation Recommendations Swallowing Evaluation Recommendations Recommendations: PO diet PO Diet Recommendation: Regular; Thin liquids (Level 0) Liquid Administration via: Cup; Straw Medication Administration: Whole meds with liquid Supervision: Patient able to self-feed; Set-up assistance for safety Oral care recommendations: Oral care BID (2x/day) Treatment Plan Treatment Plan Treatment  recommendations: Therapy as outlined in treatment plan below Follow-up recommendations: No SLP follow up Treatment frequency: Min 1x/week Treatment duration: 1 week Interventions: Diet toleration management by SLP Recommendations Recommendations for follow up therapy  are one component of a multi-disciplinary discharge planning process, led by the attending physician.  Recommendations may be updated based on patient status, additional functional criteria and insurance authorization. Assessment: Orofacial Exam: Orofacial Exam Oral Cavity - Dentition: Poor condition; Missing dentition Orofacial Anatomy: WFL Oral Motor/Sensory Function: WFL Anatomy: Anatomy: WFL Boluses Administered: Boluses Administered Boluses Administered: Thin liquids (Level 0); Mildly thick liquids (Level 2, nectar thick); Puree; Solid  Oral Impairment Domain: Oral Impairment Domain Lip Closure: No labial escape Tongue control during bolus hold: Escape to lateral buccal cavity/floor of mouth Bolus preparation/mastication: Timely and efficient chewing and mashing Bolus transport/lingual motion: Brisk tongue motion Oral residue: Trace residue lining oral structures Location of oral residue : Tongue Initiation of pharyngeal swallow : Pyriform sinuses  Pharyngeal Impairment Domain: Pharyngeal Impairment Domain Soft palate elevation: No bolus between soft palate (SP)/pharyngeal wall (PW) Laryngeal elevation: Complete superior movement of thyroid cartilage with complete approximation of arytenoids to epiglottic petiole Anterior hyoid excursion: Complete anterior movement Epiglottic movement: Complete inversion Laryngeal vestibule closure: Complete, no air/contrast in laryngeal vestibule Pharyngeal stripping wave : Present - complete Pharyngeal contraction (A/P view only): N/A Pharyngoesophageal segment opening: Complete distension and complete duration, no obstruction of flow Tongue base retraction: No contrast between tongue base and posterior  pharyngeal wall (PPW) Pharyngeal residue: Complete pharyngeal clearance Location of pharyngeal residue: N/A  Esophageal Impairment Domain: Esophageal Impairment Domain Esophageal clearance upright position: Complete clearance, esophageal coating Pill: Esophageal Impairment Domain Esophageal clearance upright position: Complete clearance, esophageal coating Penetration/Aspiration Scale Score: Penetration/Aspiration Scale Score 1.  Material does not enter airway: Thin liquids (Level 0); Mildly thick liquids (Level 2, nectar thick); Puree; Solid; Pill Compensatory Strategies: Compensatory Strategies Compensatory strategies: Yes Straw: Effective   General Information: Caregiver present: No  Diet Prior to this Study: NPO   Temperature : Febrile   Respiratory Status: WFL   Supplemental O2: None (Room air)   History of Recent Intubation: Yes  Behavior/Cognition: Alert; Cooperative; Requires cueing Self-Feeding Abilities: Able to self-feed Baseline vocal quality/speech: Hypophonia/low volume Volitional Cough: Able to elicit Volitional Swallow: Able to elicit Exam Limitations: Limited visibility; Excessive movement Goal Planning: Prognosis for improved oropharyngeal function: Good No data recorded No data recorded Patient/Family Stated Goal: HOB reclined No data recorded Pain: Pain Assessment Pain Assessment: No/denies pain Pain Score: 8 Faces Pain Scale: 4 Breathing: 0 Negative Vocalization: 0 Facial Expression: 0 Body Language: 0 Consolability: 0 PAINAD Score: 0 Facial Expression: 0 Body Movements: 0 Muscle Tension: 0 Compliance with ventilator (intubated pts.): N/A Vocalization (extubated pts.): 0 CPOT Total: 0 Pain Location: abdomen Pain Descriptors / Indicators: Aching; Grimacing; Guarding Pain Intervention(s): Limited activity within patient's tolerance; Monitored during session; Repositioned End of Session: Start Time:SLP Start Time (ACUTE ONLY): 4401 Stop Time: SLP Stop Time (ACUTE ONLY): 0272 Time Calculation:SLP  Time Calculation (min) (ACUTE ONLY): 18 min Charges: SLP Evaluations $ SLP Speech Visit: 1 Visit SLP Evaluations $BSS Swallow: 1 Procedure $MBS Swallow: 1 Procedure $Swallowing Treatment: 1 Procedure SLP visit diagnosis: SLP Visit Diagnosis: Dysphagia, unspecified (R13.10) Past Medical History: Past Medical History: Diagnosis Date  CAD (coronary artery disease)   a. 01/2011 Anterior STEMI/Cath/PCI: LM nl, LAD 100d (3.5x42mm Vision BMS placed), LCX 52m, RI 50, RCA min irregs, EF 40% w/ apical, inferoapical HK; b. 10/2022 NSTSEMI/Cath: LM nl, LAD 5p/m, RI 85, LCX 136m, OM1 mild dzs, OM3 100 fills via L->L collats from dLAD, RCA 80p, 13m, RPDA fills via collats from LAD. EF 45-50%-->Med Rx.  Cardiac arrest - ventricular fibrillation  a. In setting of STEMI 01/2011  Chronic pain   Cocaine abuse (HCC)   Depression with anxiety   ETOH abuse   a. 6-12 beers/day  GERD (gastroesophageal reflux disease)   GI bleed   Hemorrhoids   Hyperlipidemia   Hypertension   Ischemic cardiomyopathy   a. 06/2011 Echo: EF 45-50%, No rwma; b. 10/2022 Echo: EF 55-60%, no rwma, nl RV fxn.  Marijuana abuse   Migraines   Tobacco abuse   a. 1/2 ppd x 26 yrs Past Surgical History: Past Surgical History: Procedure Laterality Date  COLONOSCOPY  08/09/2021  Procedure: COLONOSCOPY;  Surgeon: Midge Minium, MD;  Location: [facility] ENDOSCOPY;  Service: Endoscopy;;  CORONARY ANGIOPLASTY WITH STENT PLACEMENT    ESOPHAGEAL BANDING  04/08/2023  Procedure: ESOPHAGEAL BANDING;  Surgeon: Tressia Danas, MD;  Location: [facility] ENDOSCOPY;  Service: Gastroenterology;;  ESOPHAGOGASTRODUODENOSCOPY N/A 05/30/2022  Procedure: ESOPHAGOGASTRODUODENOSCOPY (EGD);  Surgeon: Toney Reil, MD;  Location: [facility] ENDOSCOPY;  Service: Gastroenterology;  Laterality: N/A;  ESOPHAGOGASTRODUODENOSCOPY (EGD) WITH PROPOFOL N/A 03/01/2021  Procedure: ESOPHAGOGASTRODUODENOSCOPY (EGD) WITH PROPOFOL;  Surgeon: Toney Reil, MD;  Location: [facility];  Service: Endoscopy;   Laterality: N/A;  ESOPHAGOGASTRODUODENOSCOPY (EGD) WITH PROPOFOL N/A 08/09/2021  Procedure: ESOPHAGOGASTRODUODENOSCOPY (EGD) WITH PROPOFOL;  Surgeon: Midge Minium, MD;  Location: [facility] ENDOSCOPY;  Service: Endoscopy;  Laterality: N/A;  ESOPHAGOGASTRODUODENOSCOPY (EGD) WITH PROPOFOL N/A 03/01/2023  Procedure: ESOPHAGOGASTRODUODENOSCOPY (EGD) WITH PROPOFOL;  Surgeon: Jaynie Collins, DO;  Location: [facility] ENDOSCOPY;  Service: Gastroenterology;  Laterality: N/A;  ESOPHAGOGASTRODUODENOSCOPY (EGD) WITH PROPOFOL N/A 04/08/2023  Procedure: ESOPHAGOGASTRODUODENOSCOPY (EGD) WITH PROPOFOL;  Surgeon: Tressia Danas, MD;  Location: [facility] ENDOSCOPY;  Service: Gastroenterology;  Laterality: N/A;  EVALUATION UNDER ANESTHESIA WITH HEMORRHOIDECTOMY N/A 10/31/2022  Procedure: EXAM UNDER ANESTHESIA WITH SUTURE LIGATION OF TWO BLEEDING PEDICLES;  Surgeon: Henrene Dodge, MD;  Location: [facility] ORS;  Service: General;  Laterality: N/A;  FLEXIBLE SIGMOIDOSCOPY N/A 10/02/2022  Procedure: FLEXIBLE SIGMOIDOSCOPY;  Surgeon: Meryl Dare, MD;  Location: [facility] ENDOSCOPY;  Service: Gastroenterology;  Laterality: N/A;  IR TIPS  04/11/2023  IR US GUIDE VASC ACCESS RIGHT  04/11/2023  IR US GUIDE VASC ACCESS RIGHT  04/11/2023  IR US GUIDE VASC ACCESS RIGHT  04/11/2023  LEFT HEART CATH AND CORONARY ANGIOGRAPHY N/A 11/04/2022  Procedure: LEFT HEART CATH AND CORONARY ANGIOGRAPHY;  Surgeon: Iran Ouch, MD;  Location: [facility] INVASIVE CV [facility];  Service: Cardiovascular;  Laterality: N/A;  RADIOLOGY WITH ANESTHESIA N/A 04/11/2023  Procedure: IR WITH ANESTHESIA;  Surgeon: Oley Balm, MD;  Location: [facility];  Service: Radiology;  Laterality: N/A;  VISCERAL ANGIOGRAPHY N/A 10/24/2022  Procedure: VISCERAL ANGIOGRAPHY;  Surgeon: Annice Needy, MD;  Location: [facility] INVASIVE CV LAB;  Service: Cardiovascular;  Laterality: N/A; Amanda L. Samson Frederic, MA CCC/SLP Clinical Specialist - Acute Care SLP Acute Rehabilitation Services Office number [phone] Blenda Mounts  Laurice 04/21/2023, 9:57 AM  US Abdomen Complete  Result Date: 04/17/2023 CLINICAL DATA:  Abdominal pain. EXAM: ABDOMEN ULTRASOUND COMPLETE COMPARISON:  February 28, 2023. FINDINGS: Gallbladder: No gallstones or wall thickening visualized. No sonographic Murphy sign noted by sonographer. Mild amount of sludge is noted. Common bile duct:Not visualized. Liver: Slightly nodular hepatic margins are noted suggesting cirrhosis. No definite focal sonographic abnormality is noted. Portal vein is patent on color Doppler imaging with normal direction of blood flow towards the liver. IVC: No abnormality visualized. Pancreas: Not visualized due to overlying bowel gas. Spleen: Size and appearance within normal limits. Right Kidney: Length: 11.5 cm. Echogenicity within normal limits. No mass or hydronephrosis  visualized. Left Kidney: Length: 11.3 cm. Echogenicity within normal limits. No mass or hydronephrosis visualized. Abdominal aorta: Not visualized. Other findings: None. IMPRESSION: Slightly nodular hepatic margins are noted suggesting hepatic cirrhosis. Common bile duct, pancreas and abdominal aorta were not visualized on this exam. Small amount of sludge noted within gallbladder. Electronically Signed   By: Lupita Raider M.D.   On: 04/17/2023 13:01   DG Chest Port 1 View  Result Date: 04/17/2023 CLINICAL DATA:  Abdominal pain and hypoxia.  [id number].  [id number]. EXAM: PORTABLE CHEST 1 VIEW PORTABLE SUPINE ABDOMEN, 2 FILMS COMPARISON:  Abdomen film earlier today at 11:04 a.m., portable chest 04/13/2023 FINDINGS: Portable chest at 11:31 p.m.: Prior NGT has been exchanged for a Dobbhoff feeding tube, which terminates in the gastric antrum in good position. Interstitial and faint patchy hazy opacities are noted in the lower lung fields but significantly improved. The upper lung fields have cleared. There is no substantial pleural effusion. Findings most likely indicating clearing edema and/or pneumonia. The heart is enlarged.  There is mild central vascular fullness with improvement. Mediastinum is normally outlined. Interval removal prior ETT, left IJ central line. Portable supine abdomen, 11:35 p.m., 2 films: Again noted is a TIPS stent in the liver. There is aeration of the intestinal tract without pathologic dilatation with colonic gas through to the rectum. No pathologic calcifications or acute radiographic findings are seen and no supine evidence of free air. There are degenerative changes of the spine. No significant osseous findings. IMPRESSION: 1. Dobbhoff feeding tube terminates in the distal gastric antrum in good position. 2. Interstitial and faint patchy hazy opacities in the lower lung fields but significantly improved. Findings most likely indicating clearing edema and/or pneumonia. 3. Cardiomegaly. 4. Aeration throughout the intestinal tract, without pathologic dilatation. 5. TIPS stent in the liver. Electronically Signed   By: Almira Bar M.D.   On: 04/17/2023 00:07   DG Abd 1 View  Result Date: 04/17/2023 CLINICAL DATA:  Abdominal pain and hypoxia.  [id number].  [id number]. EXAM: PORTABLE CHEST 1 VIEW PORTABLE SUPINE ABDOMEN, 2 FILMS COMPARISON:  Abdomen film earlier today at 11:04 a.m., portable chest 04/13/2023 FINDINGS: Portable chest at 11:31 p.m.: Prior NGT has been exchanged for a Dobbhoff feeding tube, which terminates in the gastric antrum in good position. Interstitial and faint patchy hazy opacities are noted in the lower lung fields but significantly improved. The upper lung fields have cleared. There is no substantial pleural effusion. Findings most likely indicating clearing edema and/or pneumonia. The heart is enlarged. There is mild central vascular fullness with improvement. Mediastinum is normally outlined. Interval removal prior ETT, left IJ central line. Portable supine abdomen, 11:35 p.m., 2 films: Again noted is a TIPS stent in the liver. There is aeration of the intestinal tract without pathologic  dilatation with colonic gas through to the rectum. No pathologic calcifications or acute radiographic findings are seen and no supine evidence of free air. There are degenerative changes of the spine. No significant osseous findings. IMPRESSION: 1. Dobbhoff feeding tube terminates in the distal gastric antrum in good position. 2. Interstitial and faint patchy hazy opacities in the lower lung fields but significantly improved. Findings most likely indicating clearing edema and/or pneumonia. 3. Cardiomegaly. 4. Aeration throughout the intestinal tract, without pathologic dilatation. 5. TIPS stent in the liver. Electronically Signed   By: Almira Bar M.D.   On: 04/17/2023 00:07   DG Abd Portable 1V  Result Date: 04/16/2023 CLINICAL DATA:  Feeding tube placement EXAM: PORTABLE  ABDOMEN - 1 VIEW limited for tube placement COMPARISON:  X-ray 04/15/2023 FINDINGS: Prior NG tube has been removed. New Dobbhoff tube with tip crossing to the right side of the abdomen, likely at the pylorus or proximal duodenum. Minimal slack in the stomach. Elsewhere note is made of a presumed tips shunt overlying the liver. Gaseous distention of bowel elsewhere in the visualized abdomen IMPRESSION: Limited x-ray for tube placement has a Dobbhoff tube with tip to the right of the abdomen. Likely pylorus or proximal duodenum Electronically Signed   By: Karen Kays M.D.   On: 04/16/2023 11:30   DG Abd Portable 1V  Result Date: 04/15/2023 CLINICAL DATA:  Nasogastric tube placement EXAM: PORTABLE ABDOMEN - 1 VIEW COMPARISON:  Portable exam 1415 hours compared to 04/12/2023 FINDINGS: 2 nasogastric tubes are identified, projecting over mid stomach and distal stomach. Air-filled nondistended colon. TIPS shunt RIGHT upper quadrant. Minimal atelectasis or infiltrate LEFT base. IMPRESSION: Two nasogastric tubes project over stomach as above. Electronically Signed   By: Ulyses Southward M.D.   On: 04/15/2023 14:30   DG CHEST PORT 1 VIEW  Result  Date: 04/13/2023 CLINICAL DATA:  Fever. EXAM: PORTABLE CHEST 1 VIEW COMPARISON:  April 12, 2023 FINDINGS: Endotracheal tube in satisfactory position. Enteric catheter collimated off the image. Left-sided central venous catheter terminates at the cavoatrial juncture. Enlarged heart. Diffuse bilateral hazy and nodular opacities throughout both lungs. Osseous structures are without acute abnormality. Soft tissues are grossly normal. IMPRESSION: 1. Diffuse bilateral hazy and nodular opacities throughout both lungs. No significant change from the prior radiograph. 2. Enlarged heart. 3. Support apparatus as described above. Electronically Signed   By: Ted Mcalpine M.D.   On: 04/13/2023 15:32   IR Tips  Result Date: 04/12/2023 CLINICAL DATA:  Hematemesis.  Upper GI bleed. Briefly, 52 year old male with history significant for EtOH cirrhosis and esophageal varices s/p EGD with banding 03/01/2023 and plan for interval TIPS. Patient represented to ER with upper GI bleed requiring urgent EGD and repeat banding 04/08/2023. MELD = 16 (04/11/2023). EXAM: Procedures: 1. TRANSJUGULAR INTRAHEPATIC PORTOSYSTEMIC SHUNT (TIPS) 2. INTRACARDIAC ECHOCARDIOGRAPHY (ICE) 3. DIRECT (PERCUTANEOUS) PORTAL VENOUS ACCESS Performing Physician: Roanna Banning, MD Assistant(s): Malachy Moan, MD A qualified trainee/resident or advanced practice provider (APP) was not immediately available to assist with this case. MEDICATIONS: As antibiotic prophylaxis, Vancomycin 1 gm IV was ordered pre-procedure and administered intravenously within one hour of incision. ANESTHESIA/SEDATION: Sedation by the Anesthesia Team was performed. Please see anesthesiology log for details. CONTRAST:  145 mL Omnipaque 300, intravenous FLUOROSCOPY TIME:  Fluoroscopic dose; 1273 mGy COMPLICATIONS: None immediate. PROCEDURE: Informed written consent was obtained from the patient and/or patient's representative after a thorough discussion of the procedural risks,  benefits and alternatives. All questions were addressed. Maximal Sterile Barrier Technique was utilized including caps, mask, sterile gowns, sterile gloves, sterile drape, hand hygiene and skin antiseptic. A timeout was performed prior to the initiation of the procedure. Initial ultrasound scanning demonstrates a small amount of ascites within the RIGHT abdomen. An image was saved for documentation purposes. Paracentesis was NOT performed. A preliminary ultrasound of the RIGHT groin was performed and demonstrates a patent RIGHT common femoral vein. A permanent ultrasound image was recorded. Using a combination of fluoroscopy and ultrasound, an access site was determined. A small dermatotomy was made at the planned puncture site. Using ultrasound guidance, access into the RIGHT greater saphenous vein was obtained with visualization of needle entry into the vessel using a standard micropuncture technique. A wire was  advanced into the IVC insert all fascial dilation performed. An 8 Fr, 11 cm vascular sheath was placed into the external iliac vein. Through this access site, an 8 Fr AcuNav ICE catheter was advanced with ease under fluoroscopic guidance to the level of the intrahepatic inferior vena cava. A preliminary ultrasound of the RIGHT neck was performed and demonstrates a patent internal jugular vein. A permanent ultrasound image was recorded. Using a combination of fluoroscopy and ultrasound, an access site was determined. A small dermatotomy was made at the planned puncture site. Using ultrasound guidance, access into the RIGHT internal jugular vein was obtained with visualization of needle entry into the vessel using a standard micropuncture technique. A wire was advanced into the IVC and serial fascial dilation performed. A 10 Fr 35 cm Cook TIPS sheath was placed into the internal jugular vein and advanced to the IVC. The jugular sheath was retracted into the RIGHT atrium and manometry was performed. HEPATIC  VENOGRAM: A 5 Fr angled tip catheter was then directed into the RIGHT hepatic vein. Hepatic venogram was performed. These images demonstrated patent variant anatomy venous an accessory, inferior RIGHT hepatic vein at the level of the caudate. No evidence of the hepatic venous stenosis. The catheter was advanced to a wedge portion of the a patent vein over which the sheath was advanced into the distal hepatic vein. Using ICE ultrasound visualization the catheter as RIGHT hepatic vein as well as the portal anatomy was defined. Under direct ultrasound visualization, the Argon Scorpion X needle was advanced into the central RIGHT portal vein. Despite multiple attempts, the hepatic vein and portal vein could not be placed into a single sonographic plane and there was an adequate purchase for transhepatic access. ULTRASOUND-GUIDED PORTAL VENOUS ACCESS: Evaluation of the RIGHT upper quadrant showed a patent portal vein. Under ultrasound guidance, a 15 cm 21-G needle was inserted into the portal vein. A 0.018 inch Nitinol guidewire was advanced through the needle. Hand injection of contrast confirmed position within the portal system. The track was dilated using a transitional dilator set, a 0.035 inch wire was inserted into the portal vein and over the wire a sheath was inserted. A snare was inserted and advanced to the level of the portal vein. "GUN SIGHT" TIPS SHUNT CREATION: A snare was also advanced through the transjugular sheath into the RIGHT hepatic vein. With meticulous triangulation, an oblique view was obtained which overlaid the snares upon each other and a 15 cm 21-gauge needle was advanced via direct/percutaneous/transhepatic approach through the snares. A perpendicular view was obtained which demonstrated that the needle was through both snares. A 0.018 inch guidewire was advanced through the needle into the RIGHT hepatic vein, snared, and pulled through the jugular access site. Using a CXI crossing  catheter, portal venous access was obtained through the hepatic vein. Hand injection of contrast confirmed position within the portal system. A Glidewire was then advanced into the splenic vein. A 5 Fr marking pigtail catheter was then advanced over the wire into the main portal vein and wire removed. Portal venogram was performed which demonstrated a patent portal vein. The tract was then pre-dilated with 8 mm x 10 mm Athletis balloon. A 8-10 mm by 7 + 2 cm of GORE Viatorr TIPS stent was placed. After placement of the shunt, RIGHT atrial and portal manometry was performed. Completion portal venogram demonstrated a patent TIPS endograft without residual filling of the gastroesophageal varices. The catheters and sheath were removed and manual compression was applied to  the RIGHT internal jugular and RIGHT common femoral venous access sites until hemostasis was achieved. The patient was transferred back to the ICU in stable condition. Post-TIPS Mean Pressures (mmHg): Right atrium:23 Portal vein: 30 Portosystemic gradient: 7 FINDINGS: *Access via the RIGHT jugular and greater saphenous veins. *Aborted ICE-guided TIPS attempt secondary to challenging venous anatomy. Accessory RIGHT hepatic vein the caudate without adequate purchase for transhepatic access. *Successful direct portal venous access and TIPS creation, via the accessory RIGHT hepatic vein to the RIGHT portal vein, with placement of a 7 cm GORE Viatorr stent. *Small volume perihepatic ascites. Paracentesis was NOT performed. IMPRESSION: 1. Successful creation of Transjugular Intrahepatic Portosystemic Shunt (TIPS) via 'Gun sight' technique and direct/percutaneous portal venous access, under ultrasound and fluoroscopy. 2. Reduction of portal hypertension with a completion portosystemic gradient of 7 mm Hg. 3. No residual opacification of gastroesophageal varices. No variceal embolization was required or performed. PLAN: *Lactulose TID and continue to  evaluate/monitor for hepatic encephalopathy. *The patient will be enrolled in the VIR portal hypertension follow-up clinic, and will be closely followed per the protocol. Roanna Banning, MD [facility] Radiology Special[facility] Radiology Electronically Signed   By: Roanna Banning M.D.   On: 04/12/2023 12:33   IR US Guide Vasc Access Right  Result Date: 04/12/2023 CLINICAL DATA:  Hematemesis.  Upper GI bleed. Briefly, 52 year old male with history significant for EtOH cirrhosis and esophageal varices s/p EGD with banding 03/01/2023 and plan for interval TIPS. Patient represented to ER with upper GI bleed requiring urgent EGD and repeat banding 04/08/2023. MELD = 16 (04/11/2023). EXAM: Procedures: 1. TRANSJUGULAR INTRAHEPATIC PORTOSYSTEMIC SHUNT (TIPS) 2. INTRACARDIAC ECHOCARDIOGRAPHY (ICE) 3. DIRECT (PERCUTANEOUS) PORTAL VENOUS ACCESS Performing Physician: Roanna Banning, MD Assistant(s): Malachy Moan, MD A qualified trainee/resident or advanced practice provider (APP) was not immediately available to assist with this case. MEDICATIONS: As antibiotic prophylaxis, Vancomycin 1 gm IV was ordered pre-procedure and administered intravenously within one hour of incision. ANESTHESIA/SEDATION: Sedation by the Anesthesia Team was performed. Please see anesthesiology log for details. CONTRAST:  145 mL Omnipaque 300, intravenous FLUOROSCOPY TIME:  Fluoroscopic dose; 1273 mGy COMPLICATIONS: None immediate. PROCEDURE: Informed written consent was obtained from the patient and/or patient's representative after a thorough discussion of the procedural risks, benefits and alternatives. All questions were addressed. Maximal Sterile Barrier Technique was utilized including caps, mask, sterile gowns, sterile gloves, sterile drape, hand hygiene and skin antiseptic. A timeout was performed prior to the initiation of the procedure. Initial ultrasound scanning demonstrates a small amount of ascites within the RIGHT  abdomen. An image was saved for documentation purposes. Paracentesis was NOT performed. A preliminary ultrasound of the RIGHT groin was performed and demonstrates a patent RIGHT common femoral vein. A permanent ultrasound image was recorded. Using a combination of fluoroscopy and ultrasound, an access site was determined. A small dermatotomy was made at the planned puncture site. Using ultrasound guidance, access into the RIGHT greater saphenous vein was obtained with visualization of needle entry into the vessel using a standard micropuncture technique. A wire was advanced into the IVC insert all fascial dilation performed. An 8 Fr, 11 cm vascular sheath was placed into the external iliac vein. Through this access site, an 8 Fr AcuNav ICE catheter was advanced with ease under fluoroscopic guidance to the level of the intrahepatic inferior vena cava. A preliminary ultrasound of the RIGHT neck was performed and demonstrates a patent internal jugular vein. A permanent ultrasound image was recorded. Using a combination of fluoroscopy and ultrasound,  an access site was determined. A small dermatotomy was made at the planned puncture site. Using ultrasound guidance, access into the RIGHT internal jugular vein was obtained with visualization of needle entry into the vessel using a standard micropuncture technique. A wire was advanced into the IVC and serial fascial dilation performed. A 10 Fr 35 cm Cook TIPS sheath was placed into the internal jugular vein and advanced to the IVC. The jugular sheath was retracted into the RIGHT atrium and manometry was performed. HEPATIC VENOGRAM: A 5 Fr angled tip catheter was then directed into the RIGHT hepatic vein. Hepatic venogram was performed. These images demonstrated patent variant anatomy venous an accessory, inferior RIGHT hepatic vein at the level of the caudate. No evidence of the hepatic venous stenosis. The catheter was advanced to a wedge portion of the a patent vein over  which the sheath was advanced into the distal hepatic vein. Using ICE ultrasound visualization the catheter as RIGHT hepatic vein as well as the portal anatomy was defined. Under direct ultrasound visualization, the Argon Scorpion X needle was advanced into the central RIGHT portal vein. Despite multiple attempts, the hepatic vein and portal vein could not be placed into a single sonographic plane and there was an adequate purchase for transhepatic access. ULTRASOUND-GUIDED PORTAL VENOUS ACCESS: Evaluation of the RIGHT upper quadrant showed a patent portal vein. Under ultrasound guidance, a 15 cm 21-G needle was inserted into the portal vein. A 0.018 inch Nitinol guidewire was advanced through the needle. Hand injection of contrast confirmed position within the portal system. The track was dilated using a transitional dilator set, a 0.035 inch wire was inserted into the portal vein and over the wire a sheath was inserted. A snare was inserted and advanced to the level of the portal vein. "GUN SIGHT" TIPS SHUNT CREATION: A snare was also advanced through the transjugular sheath into the RIGHT hepatic vein. With meticulous triangulation, an oblique view was obtained which overlaid the snares upon each other and a 15 cm 21-gauge needle was advanced via direct/percutaneous/transhepatic approach through the snares. A perpendicular view was obtained which demonstrated that the needle was through both snares. A 0.018 inch guidewire was advanced through the needle into the RIGHT hepatic vein, snared, and pulled through the jugular access site. Using a CXI crossing catheter, portal venous access was obtained through the hepatic vein. Hand injection of contrast confirmed position within the portal system. A Glidewire was then advanced into the splenic vein. A 5 Fr marking pigtail catheter was then advanced over the wire into the main portal vein and wire removed. Portal venogram was performed which demonstrated a patent  portal vein. The tract was then pre-dilated with 8 mm x 10 mm Athletis balloon. A 8-10 mm by 7 + 2 cm of GORE Viatorr TIPS stent was placed. After placement of the shunt, RIGHT atrial and portal manometry was performed. Completion portal venogram demonstrated a patent TIPS endograft without residual filling of the gastroesophageal varices. The catheters and sheath were removed and manual compression was applied to the RIGHT internal jugular and RIGHT common femoral venous access sites until hemostasis was achieved. The patient was transferred back to the ICU in stable condition. Post-TIPS Mean Pressures (mmHg): Right atrium:23 Portal vein: 30 Portosystemic gradient: 7 FINDINGS: *Access via the RIGHT jugular and greater saphenous veins. *Aborted ICE-guided TIPS attempt secondary to challenging venous anatomy. Accessory RIGHT hepatic vein the caudate without adequate purchase for transhepatic access. *Successful direct portal venous access and TIPS creation, via  the accessory RIGHT hepatic vein to the RIGHT portal vein, with placement of a 7 cm GORE Viatorr stent. *Small volume perihepatic ascites. Paracentesis was NOT performed. IMPRESSION: 1. Successful creation of Transjugular Intrahepatic Portosystemic Shunt (TIPS) via 'Gun sight' technique and direct/percutaneous portal venous access, under ultrasound and fluoroscopy. 2. Reduction of portal hypertension with a completion portosystemic gradient of 7 mm Hg. 3. No residual opacification of gastroesophageal varices. No variceal embolization was required or performed. PLAN: *Lactulose TID and continue to evaluate/monitor for hepatic encephalopathy. *The patient will be enrolled in the VIR portal hypertension follow-up clinic, and will be closely followed per the protocol. Roanna Banning, MD [facility] Radiology Special[facility] Radiology Electronically Signed   By: Roanna Banning M.D.   On: 04/12/2023 12:33   IR US Guide Vasc Access Right  Result  Date: 04/12/2023 CLINICAL DATA:  Hematemesis.  Upper GI bleed. Briefly, 52 year old male with history significant for EtOH cirrhosis and esophageal varices s/p EGD with banding 03/01/2023 and plan for interval TIPS. Patient represented to ER with upper GI bleed requiring urgent EGD and repeat banding 04/08/2023. MELD = 16 (04/11/2023). EXAM: Procedures: 1. TRANSJUGULAR INTRAHEPATIC PORTOSYSTEMIC SHUNT (TIPS) 2. INTRACARDIAC ECHOCARDIOGRAPHY (ICE) 3. DIRECT (PERCUTANEOUS) PORTAL VENOUS ACCESS Performing Physician: Roanna Banning, MD Assistant(s): Malachy Moan, MD A qualified trainee/resident or advanced practice provider (APP) was not immediately available to assist with this case. MEDICATIONS: As antibiotic prophylaxis, Vancomycin 1 gm IV was ordered pre-procedure and administered intravenously within one hour of incision. ANESTHESIA/SEDATION: Sedation by the Anesthesia Team was performed. Please see anesthesiology log for details. CONTRAST:  145 mL Omnipaque 300, intravenous FLUOROSCOPY TIME:  Fluoroscopic dose; 1273 mGy COMPLICATIONS: None immediate. PROCEDURE: Informed written consent was obtained from the patient and/or patient's representative after a thorough discussion of the procedural risks, benefits and alternatives. All questions were addressed. Maximal Sterile Barrier Technique was utilized including caps, mask, sterile gowns, sterile gloves, sterile drape, hand hygiene and skin antiseptic. A timeout was performed prior to the initiation of the procedure. Initial ultrasound scanning demonstrates a small amount of ascites within the RIGHT abdomen. An image was saved for documentation purposes. Paracentesis was NOT performed. A preliminary ultrasound of the RIGHT groin was performed and demonstrates a patent RIGHT common femoral vein. A permanent ultrasound image was recorded. Using a combination of fluoroscopy and ultrasound, an access site was determined. A small dermatotomy was made at the planned  puncture site. Using ultrasound guidance, access into the RIGHT greater saphenous vein was obtained with visualization of needle entry into the vessel using a standard micropuncture technique. A wire was advanced into the IVC insert all fascial dilation performed. An 8 Fr, 11 cm vascular sheath was placed into the external iliac vein. Through this access site, an 8 Fr AcuNav ICE catheter was advanced with ease under fluoroscopic guidance to the level of the intrahepatic inferior vena cava. A preliminary ultrasound of the RIGHT neck was performed and demonstrates a patent internal jugular vein. A permanent ultrasound image was recorded. Using a combination of fluoroscopy and ultrasound, an access site was determined. A small dermatotomy was made at the planned puncture site. Using ultrasound guidance, access into the RIGHT internal jugular vein was obtained with visualization of needle entry into the vessel using a standard micropuncture technique. A wire was advanced into the IVC and serial fascial dilation performed. A 10 Fr 35 cm Cook TIPS sheath was placed into the internal jugular vein and advanced to the IVC. The jugular sheath was retracted into  the RIGHT atrium and manometry was performed. HEPATIC VENOGRAM: A 5 Fr angled tip catheter was then directed into the RIGHT hepatic vein. Hepatic venogram was performed. These images demonstrated patent variant anatomy venous an accessory, inferior RIGHT hepatic vein at the level of the caudate. No evidence of the hepatic venous stenosis. The catheter was advanced to a wedge portion of the a patent vein over which the sheath was advanced into the distal hepatic vein. Using ICE ultrasound visualization the catheter as RIGHT hepatic vein as well as the portal anatomy was defined. Under direct ultrasound visualization, the Argon Scorpion X needle was advanced into the central RIGHT portal vein. Despite multiple attempts, the hepatic vein and portal vein could not be  placed into a single sonographic plane and there was an adequate purchase for transhepatic access. ULTRASOUND-GUIDED PORTAL VENOUS ACCESS: Evaluation of the RIGHT upper quadrant showed a patent portal vein. Under ultrasound guidance, a 15 cm 21-G needle was inserted into the portal vein. A 0.018 inch Nitinol guidewire was advanced through the needle. Hand injection of contrast confirmed position within the portal system. The track was dilated using a transitional dilator set, a 0.035 inch wire was inserted into the portal vein and over the wire a sheath was inserted. A snare was inserted and advanced to the level of the portal vein. "GUN SIGHT" TIPS SHUNT CREATION: A snare was also advanced through the transjugular sheath into the RIGHT hepatic vein. With meticulous triangulation, an oblique view was obtained which overlaid the snares upon each other and a 15 cm 21-gauge needle was advanced via direct/percutaneous/transhepatic approach through the snares. A perpendicular view was obtained which demonstrated that the needle was through both snares. A 0.018 inch guidewire was advanced through the needle into the RIGHT hepatic vein, snared, and pulled through the jugular access site. Using a CXI crossing catheter, portal venous access was obtained through the hepatic vein. Hand injection of contrast confirmed position within the portal system. A Glidewire was then advanced into the splenic vein. A 5 Fr marking pigtail catheter was then advanced over the wire into the main portal vein and wire removed. Portal venogram was performed which demonstrated a patent portal vein. The tract was then pre-dilated with 8 mm x 10 mm Athletis balloon. A 8-10 mm by 7 + 2 cm of GORE Viatorr TIPS stent was placed. After placement of the shunt, RIGHT atrial and portal manometry was performed. Completion portal venogram demonstrated a patent TIPS endograft without residual filling of the gastroesophageal varices. The catheters and sheath  were removed and manual compression was applied to the RIGHT internal jugular and RIGHT common femoral venous access sites until hemostasis was achieved. The patient was transferred back to the ICU in stable condition. Post-TIPS Mean Pressures (mmHg): Right atrium:23 Portal vein: 30 Portosystemic gradient: 7 FINDINGS: *Access via the RIGHT jugular and greater saphenous veins. *Aborted ICE-guided TIPS attempt secondary to challenging venous anatomy. Accessory RIGHT hepatic vein the caudate without adequate purchase for transhepatic access. *Successful direct portal venous access and TIPS creation, via the accessory RIGHT hepatic vein to the RIGHT portal vein, with placement of a 7 cm GORE Viatorr stent. *Small volume perihepatic ascites. Paracentesis was NOT performed. IMPRESSION: 1. Successful creation of Transjugular Intrahepatic Portosystemic Shunt (TIPS) via 'Gun sight' technique and direct/percutaneous portal venous access, under ultrasound and fluoroscopy. 2. Reduction of portal hypertension with a completion portosystemic gradient of 7 mm Hg. 3. No residual opacification of gastroesophageal varices. No variceal embolization was required or performed.  PLAN: *Lactulose TID and continue to evaluate/monitor for hepatic encephalopathy. *The patient will be enrolled in the VIR portal hypertension follow-up clinic, and will be closely followed per the protocol. Roanna Banning, MD [facility] Radiology Electronically Signed   By: Roanna Banning M.D.   On: 04/12/2023 12:33   IR US Guide Vasc Access Right  Result Date: 04/12/2023 CLINICAL DATA:  Hematemesis.  Upper GI bleed. Briefly, 52 year old male with history significant for EtOH cirrhosis and esophageal varices s/p EGD with banding 03/01/2023 and plan for interval TIPS. Patient represented to ER with upper GI bleed requiring urgent EGD and repeat banding 04/08/2023. MELD = 16 (04/11/2023). EXAM: Procedures: 1.  TRANSJUGULAR INTRAHEPATIC PORTOSYSTEMIC SHUNT (TIPS) 2. INTRACARDIAC ECHOCARDIOGRAPHY (ICE) 3. DIRECT (PERCUTANEOUS) PORTAL VENOUS ACCESS Performing Physician: Roanna Banning, MD Assistant(s): Malachy Moan, MD A qualified trainee/resident or advanced practice provider (APP) was not immediately available to assist with this case. MEDICATIONS: As antibiotic prophylaxis, Vancomycin 1 gm IV was ordered pre-procedure and administered intravenously within one hour of incision. ANESTHESIA/SEDATION: Sedation by the Anesthesia Team was performed. Please see anesthesiology log for details. CONTRAST:  145 mL Omnipaque 300, intravenous FLUOROSCOPY TIME:  Fluoroscopic dose; 1273 mGy COMPLICATIONS: None immediate. PROCEDURE: Informed written consent was obtained from the patient and/or patient's representative after a thorough discussion of the procedural risks, benefits and alternatives. All questions were addressed. Maximal Sterile Barrier Technique was utilized including caps, mask, sterile gowns, sterile gloves, sterile drape, hand hygiene and skin antiseptic. A timeout was performed prior to the initiation of the procedure. Initial ultrasound scanning demonstrates a small amount of ascites within the RIGHT abdomen. An image was saved for documentation purposes. Paracentesis was NOT performed. A preliminary ultrasound of the RIGHT groin was performed and demonstrates a patent RIGHT common femoral vein. A permanent ultrasound image was recorded. Using a combination of fluoroscopy and ultrasound, an access site was determined. A small dermatotomy was made at the planned puncture site. Using ultrasound guidance, access into the RIGHT greater saphenous vein was obtained with visualization of needle entry into the vessel using a standard micropuncture technique. A wire was advanced into the IVC insert all fascial dilation performed. An 8 Fr, 11 cm vascular sheath was placed into the external iliac vein. Through this access site,  an 8 Fr AcuNav ICE catheter was advanced with ease under fluoroscopic guidance to the level of the intrahepatic inferior vena cava. A preliminary ultrasound of the RIGHT neck was performed and demonstrates a patent internal jugular vein. A permanent ultrasound image was recorded. Using a combination of fluoroscopy and ultrasound, an access site was determined. A small dermatotomy was made at the planned puncture site. Using ultrasound guidance, access into the RIGHT internal jugular vein was obtained with visualization of needle entry into the vessel using a standard micropuncture technique. A wire was advanced into the IVC and serial fascial dilation performed. A 10 Fr 35 cm Cook TIPS sheath was placed into the internal jugular vein and advanced to the IVC. The jugular sheath was retracted into the RIGHT atrium and manometry was performed. HEPATIC VENOGRAM: A 5 Fr angled tip catheter was then directed into the RIGHT hepatic vein. Hepatic venogram was performed. These images demonstrated patent variant anatomy venous an accessory, inferior RIGHT hepatic vein at the level of the caudate. No evidence of the hepatic venous stenosis. The catheter was advanced to a wedge portion of the a patent vein over which the sheath was advanced into the distal hepatic vein. Using  ICE ultrasound visualization the catheter as RIGHT hepatic vein as well as the portal anatomy was defined. Under direct ultrasound visualization, the Argon Scorpion X needle was advanced into the central RIGHT portal vein. Despite multiple attempts, the hepatic vein and portal vein could not be placed into a single sonographic plane and there was an adequate purchase for transhepatic access. ULTRASOUND-GUIDED PORTAL VENOUS ACCESS: Evaluation of the RIGHT upper quadrant showed a patent portal vein. Under ultrasound guidance, a 15 cm 21-G needle was inserted into the portal vein. A 0.018 inch Nitinol guidewire was advanced through the needle. Hand injection  of contrast confirmed position within the portal system. The track was dilated using a transitional dilator set, a 0.035 inch wire was inserted into the portal vein and over the wire a sheath was inserted. A snare was inserted and advanced to the level of the portal vein. "GUN SIGHT" TIPS SHUNT CREATION: A snare was also advanced through the transjugular sheath into the RIGHT hepatic vein. With meticulous triangulation, an oblique view was obtained which overlaid the snares upon each other and a 15 cm 21-gauge needle was advanced via direct/percutaneous/transhepatic approach through the snares. A perpendicular view was obtained which demonstrated that the needle was through both snares. A 0.018 inch guidewire was advanced through the needle into the RIGHT hepatic vein, snared, and pulled through the jugular access site. Using a CXI crossing catheter, portal venous access was obtained through the hepatic vein. Hand injection of contrast confirmed position within the portal system. A Glidewire was then advanced into the splenic vein. A 5 Fr marking pigtail catheter was then advanced over the wire into the main portal vein and wire removed. Portal venogram was performed which demonstrated a patent portal vein. The tract was then pre-dilated with 8 mm x 10 mm Athletis balloon. A 8-10 mm by 7 + 2 cm of GORE Viatorr TIPS stent was placed. After placement of the shunt, RIGHT atrial and portal manometry was performed. Completion portal venogram demonstrated a patent TIPS endograft without residual filling of the gastroesophageal varices. The catheters and sheath were removed and manual compression was applied to the RIGHT internal jugular and RIGHT common femoral venous access sites until hemostasis was achieved. The patient was transferred back to the ICU in stable condition. Post-TIPS Mean Pressures (mmHg): Right atrium:23 Portal vein: 30 Portosystemic gradient: 7 FINDINGS: *Access via the RIGHT jugular and greater  saphenous veins. *Aborted ICE-guided TIPS attempt secondary to challenging venous anatomy. Accessory RIGHT hepatic vein the caudate without adequate purchase for transhepatic access. *Successful direct portal venous access and TIPS creation, via the accessory RIGHT hepatic vein to the RIGHT portal vein, with placement of a 7 cm GORE Viatorr stent. *Small volume perihepatic ascites. Paracentesis was NOT performed. IMPRESSION: 1. Successful creation of Transjugular Intrahepatic Portosystemic Shunt (TIPS) via 'Gun sight' technique and direct/percutaneous portal venous access, under ultrasound and fluoroscopy. 2. Reduction of portal hypertension with a completion portosystemic gradient of 7 mm Hg. 3. No residual opacification of gastroesophageal varices. No variceal embolization was required or performed. PLAN: *Lactulose TID and continue to evaluate/monitor for hepatic encephalopathy. *The patient will be enrolled in the VIR portal hypertension follow-up clinic, and will be closely followed per the protocol. Roanna Banning, MD [facility] Radiology Electronically Signed   By: Roanna Banning M.D.   On: 04/12/2023 12:33   DG Abd Portable 1V  Result Date: 04/12/2023 CLINICAL DATA:  Tube placement EXAM: PORTABLE ABDOMEN - 1 VIEW COMPARISON:  02/28/2023  FINDINGS: Image of the upper abdomen demonstrates N/OG tube tip superimposed with the stomach below the diaphragm. IMPRESSION: N/OG tube in place. Electronically Signed   By: Layla Maw M.D.   On: 04/12/2023 11:06   Portable Chest x-ray  Result Date: 04/12/2023 CLINICAL DATA:  Check endotracheal tube placement EXAM: PORTABLE CHEST 1 VIEW COMPARISON:  04/11/2023 FINDINGS: Left jugular central line is again noted in the mid superior vena cava. Endotracheal tube is seen 3.2 cm above the carina. Cardiac shadow is enlarged but stable. Diffuse increased airspace opacity is noted bilaterally most consistent with worsening  edema. No pneumothorax is seen. No bony abnormality is noted. IMPRESSION: Endotracheal tube in satisfactory position. Worsening pulmonary edema. Electronically Signed   By: Alcide Clever M.D.   On: 04/12/2023 03:35   ECHOCARDIOGRAM COMPLETE  Result Date: 04/11/2023    ECHOCARDIOGRAM REPORT   Patient Name:   Ethan Wells Date of Exam: 04/11/2023 Medical Rec #:  [id number]           Height:       65.0 in Accession #:    [id number]          Weight:       197.3 lb Date of Birth:  March 06, 1971           BSA:          1.967 m Patient Age:    51 years            BP:           115/68 mmHg Patient Gender: M                   HR:           89 bpm. Exam Location:  Inpatient Procedure: 2D Echo, Cardiac Doppler, Color Doppler and Intracardiac            Opacification Agent Indications:    Other abnormalities of the heart R00.8  History:        Patient has prior history of Echocardiogram examinations. CAD;                 Risk Factors:Hypertension and Dyslipidemia.  Sonographer:    Mike Gip Referring Phys: [phone] JENNIFER C OMOHUNDRO IMPRESSIONS  1. Left ventricular ejection fraction, by estimation, is 55 to 60%. The left ventricle has normal function. The left ventricle has no regional wall motion abnormalities. There is mild left ventricular hypertrophy. Left ventricular diastolic parameters are consistent with Grade II diastolic dysfunction (pseudonormalization).  2. Right ventricular systolic function is normal. The right ventricular size is normal. There is normal pulmonary artery systolic pressure.  3. Anterior leaflet has increased thickening, likely calcification. Mild mitral valve regurgitation.  4. The aortic valve is tricuspid. Aortic valve regurgitation is not visualized.  5. The inferior vena cava is normal in size with greater than 50% respiratory variability, suggesting right atrial pressure of 3 mmHg. FINDINGS  Left Ventricle: Left ventricular ejection fraction, by estimation, is 55 to 60%. The left  ventricle has normal function. The left ventricle has no regional wall motion abnormalities. Definity contrast agent was given IV to delineate the left ventricular  endocardial borders. The left ventricular internal cavity size was normal in size. There is mild left ventricular hypertrophy. Left ventricular diastolic parameters are consistent with Grade II diastolic dysfunction (pseudonormalization). Right Ventricle: The right ventricular size is normal. Right ventricular systolic function is normal. There is normal pulmonary artery systolic pressure. The tricuspid regurgitant velocity is  2.54 m/s, and with an assumed right atrial pressure of 3 mmHg,  the estimated right ventricular systolic pressure is 28.8 mmHg. Left Atrium: Left atrial size was normal in size. Right Atrium: Right atrial size was normal in size. Pericardium: There is no evidence of pericardial effusion. Mitral Valve: Anterior leaflet has increased thickening, likely calcification. Mild mitral valve regurgitation. Tricuspid Valve: Tricuspid valve regurgitation is mild. Aortic Valve: The aortic valve is tricuspid. Aortic valve regurgitation is not visualized. Pulmonic Valve: Pulmonic valve regurgitation is not visualized. Aorta: The aortic root and ascending aorta are structurally normal, with no evidence of dilitation. Venous: The inferior vena cava is normal in size with greater than 50% respiratory variability, suggesting right atrial pressure of 3 mmHg. IAS/Shunts: No atrial level shunt detected by color flow Doppler.  LEFT VENTRICLE PLAX 2D LVIDd:         5.80 cm      Diastology LVIDs:         4.60 cm      LV e' medial:    4.35 cm/s LV PW:         1.10 cm      LV E/e' medial:  22.0 LV IVS:        1.00 cm      LV e' lateral:   8.59 cm/s LVOT diam:     2.20 cm      LV E/e' lateral: 11.1 LV SV:         87 LV SV Index:   44 LVOT Area:     3.80 cm  LV Volumes (MOD) LV vol d, MOD A2C: 156.0 ml LV vol d, MOD A4C: 149.0 ml LV vol s, MOD A2C: 69.6 ml  LV vol s, MOD A4C: 68.0 ml LV SV MOD A2C:     86.4 ml LV SV MOD A4C:     149.0 ml LV SV MOD BP:      87.6 ml RIGHT VENTRICLE             IVC RV Basal diam:  4.20 cm     IVC diam: 1.80 cm RV S prime:     12.30 cm/s TAPSE (M-mode): 2.0 cm LEFT ATRIUM             Index        RIGHT ATRIUM           Index LA diam:        3.90 cm 1.98 cm/m   RA Area:     20.40 cm LA Vol (A2C):   66.0 ml 33.56 ml/m  RA Volume:   55.90 ml  28.42 ml/m LA Vol (A4C):   59.3 ml 30.15 ml/m LA Biplane Vol: 62.5 ml 31.78 ml/m  AORTIC VALVE LVOT Vmax:   102.00 cm/s LVOT Vmean:  68.700 cm/s LVOT VTI:    0.229 m  AORTA Ao Root diam: 3.30 cm Ao Asc diam:  3.10 cm MITRAL VALVE               TRICUSPID VALVE MV Area (PHT): 4.60 cm    TR Peak grad:   25.8 mmHg MV Decel Time: 165 msec    TR Vmax:        254.00 cm/s MV E velocity: 95.50 cm/s MV A velocity: 32.60 cm/s  SHUNTS MV E/A ratio:  2.93        Systemic VTI:  0.23 m  Systemic Diam: 2.20 cm Carolan Clines Electronically signed by Carolan Clines Signature Date/Time: 04/11/2023/10:06:14 AM    Final    DG Chest Port 1 View  Result Date: 04/11/2023 CLINICAL DATA:  5626 Acute respiratory failure 5626 EXAM: PORTABLE CHEST 1 VIEW COMPARISON:  Chest XR, 04/09/2023.  CT AP, 01/17/2023. FINDINGS: Support lines: Interval extubation. LEFT IJ CVC with catheter tip well-positioned at the superior cavoatrial junction. Cardiomegaly. Perihilar and central bronchial wall thickening. Minimal bibasilar interstitial opacities. No pleural effusion or pneumothorax. No interval osseous abnormality. IMPRESSION: 1. Extubation.  Stable, well-positioned LEFT IJ-approach CVC. 2. Cardiomegaly with perihilar thickening and minimal basilar opacities. Findings favored to represent mild pulmonary edema and atelectasis. Electronically Signed   By: Roanna Banning M.D.   On: 04/11/2023 07:30   DG Chest Port 1 View  Result Date: 04/09/2023 CLINICAL DATA:  Hypoxia EXAM: PORTABLE CHEST 1 VIEW COMPARISON:   04/09/2023 FINDINGS: Endotracheal tube is again noted and stable. Left jugular central line are again seen in satisfactory position. Significant improvement in the degree of congestive failure is noted. Some residual basilar opacities are seen. No bony abnormality is noted. IMPRESSION: Near complete resolution of previously seen pulmonary edema. Electronically Signed   By: Alcide Clever M.D.   On: 04/09/2023 12:12   DG CHEST PORT 1 VIEW  Result Date: 04/09/2023 CLINICAL DATA:  Respiratory failure, prone repositioning EXAM: PORTABLE CHEST 1 VIEW COMPARISON:  04/08/2023 FINDINGS: Endotracheal tube is seen 14 mm above the carina. Left internal jugular central venous catheter tip noted overlying the expected superior vena cava. Pulmonary insufflation is stable. Superimposed extensive bilateral perihilar and lower lung zone consolidation in keeping with severe alveolar pulmonary edema or diffuse pneumonic infiltrate has progressed in the interval since prior examination. No pneumothorax or pleural effusion. Cardiac size within normal limits. IMPRESSION: 1. Endotracheal tube 14 mm above the carina. 2. Progressive bilateral perihilar and lower lung zone consolidation, severe alveolar pulmonary edema or diffuse pneumonic infiltrate. Electronically Signed   By: Helyn Numbers M.D.   On: 04/09/2023 03:01   DG CHEST PORT 1 VIEW  Result Date: 04/08/2023 CLINICAL DATA:  Hypoxia EXAM: PORTABLE CHEST 1 VIEW COMPARISON:  04/07/2023 FINDINGS: The endotracheal tube has been placed with tip measuring 3.8 cm above the carina. Left central venous catheter with tip over the low SVC region. No pneumothorax. Cardiac enlargement with bilateral perihilar infiltrates, progressing since prior study, likely edema. No pleural effusions. No pneumothorax. Mediastinal contours appear intact. IMPRESSION: 1. Appliances appear in satisfactory position. 2. Cardiac enlargement with perihilar infiltration increasing since prior study, likely  edema. Electronically Signed   By: Burman Nieves M.D.   On: 04/08/2023 22:04   DG Chest Port 1 View  Result Date: 04/07/2023 CLINICAL DATA:  Hematemesis and abdominal pain EXAM: PORTABLE CHEST 1 VIEW COMPARISON:  01/18/2023 FINDINGS: Unchanged cardiomegaly. No new focal pulmonary opacity. Chronic coarsened interstitial markings without overt pulmonary edema. No pleural effusion or pneumothorax. No acute osseous abnormality. IMPRESSION: Unchanged cardiomegaly. No acute cardiopulmonary process. Electronically Signed   By: Wiliam Ke M.D.   On: 04/07/2023 11:27     Assessment and plan-   # LIRAD 3 liver mass in the context of liver cirrhosis. Check AFP-result is pending. If AFP is elevated, recommend liver mass biopsy to clarify diagnosis. Child Pugh B liver cirrhosis Patient's bilirubin is elevated at 3, esophageal varices, history of TIPS, portal hypertension.  # Abdominal pain, nausea vomiting diarrhea.  Etiology is unclear. Check C diff and GI panel.  # Chronic  thrombocytopenia secondary to splenomegaly/liver cirrhosis.  .  No acute bleeding events. Monitor # Polysubstance abuse.   Thank you for allowing me to participate in the care of this patient.   Rickard Patience, MD, PhD Hematology Oncology 07/01/2023

## 2023-07-02 DIAGNOSIS — R7989 Other specified abnormal findings of blood chemistry: Secondary | ICD-10-CM | POA: Insufficient documentation

## 2023-07-02 DIAGNOSIS — R160 Hepatomegaly, not elsewhere classified: Secondary | ICD-10-CM | POA: Diagnosis not present

## 2023-07-02 DIAGNOSIS — R112 Nausea with vomiting, unspecified: Secondary | ICD-10-CM | POA: Diagnosis not present

## 2023-07-02 DIAGNOSIS — D61818 Other pancytopenia: Secondary | ICD-10-CM

## 2023-07-02 DIAGNOSIS — I2510 Atherosclerotic heart disease of native coronary artery without angina pectoris: Secondary | ICD-10-CM

## 2023-07-02 DIAGNOSIS — E271 Primary adrenocortical insufficiency: Secondary | ICD-10-CM | POA: Diagnosis not present

## 2023-07-02 DIAGNOSIS — R1084 Generalized abdominal pain: Secondary | ICD-10-CM

## 2023-07-02 DIAGNOSIS — E785 Hyperlipidemia, unspecified: Secondary | ICD-10-CM

## 2023-07-02 DIAGNOSIS — F1010 Alcohol abuse, uncomplicated: Secondary | ICD-10-CM

## 2023-07-02 LAB — COMPREHENSIVE METABOLIC PANEL
ALT: 20 U/L (ref 0–44)
AST: 75 U/L — ABNORMAL HIGH (ref 15–41)
Albumin: 2.5 g/dL — ABNORMAL LOW (ref 3.5–5.0)
Alkaline Phosphatase: 248 U/L — ABNORMAL HIGH (ref 38–126)
Anion gap: 5 (ref 5–15)
BUN: <5 mg/dL — ABNORMAL LOW (ref 6–20)
CO2: 21 mmol/L — ABNORMAL LOW (ref 22–32)
Calcium: 8.2 mg/dL — ABNORMAL LOW (ref 8.9–10.3)
Chloride: 111 mmol/L (ref 98–111)
Creatinine, Ser: 0.57 mg/dL — ABNORMAL LOW (ref 0.61–1.24)
GFR, Estimated: >60 mL/min (ref >60)
Glucose, Bld: 79 mg/dL (ref 70–99)
Potassium: 4 mmol/L (ref 3.5–5.1)
Sodium: 137 mmol/L (ref 135–145)
Total Bilirubin: 4.3 mg/dL — ABNORMAL HIGH (ref 0.3–1.2)
Total Protein: 6.1 g/dL — ABNORMAL LOW (ref 6.5–8.1)

## 2023-07-02 LAB — CBC
HCT: 28.7 % — ABNORMAL LOW (ref 39.0–52.0)
Hemoglobin: 9.3 g/dL — ABNORMAL LOW (ref 13.0–17.0)
MCH: 29.9 pg (ref 26.0–34.0)
MCHC: 32.4 g/dL (ref 30.0–36.0)
MCV: 92.3 fL (ref 80.0–100.0)
Platelets: 71 10*3/uL — ABNORMAL LOW (ref 150–400)
RBC: 3.11 MIL/uL — ABNORMAL LOW (ref 4.22–5.81)
RDW: 17.2 % — ABNORMAL HIGH (ref 11.5–15.5)
WBC: 2.3 10*3/uL — ABNORMAL LOW (ref 4.0–10.5)
nRBC: 0 % (ref 0.0–0.2)

## 2023-07-02 LAB — PHOSPHORUS: Phosphorus: 3.2 mg/dL (ref 2.5–4.6)

## 2023-07-02 LAB — MAGNESIUM: Magnesium: 1.8 mg/dL (ref 1.7–2.4)

## 2023-07-02 LAB — AFP TUMOR MARKER: AFP, Serum, Tumor Marker: 3.2 ng/mL (ref 0.0–6.9)

## 2023-07-02 NOTE — Assessment & Plan Note (Addendum)
Patient states that he has not been taking Plavix as outpatient.  Plavix prescribed.

## 2023-07-02 NOTE — Assessment & Plan Note (Signed)
Continue Cortef 

## 2023-07-02 NOTE — Progress Notes (Signed)
Progress Note   [name] [id number]:[id number] DOB: Apr 06, 1971 DOA: 06/30/2023     0 DOS: the patient was seen and examined on 07/02/2023   Brief hospital course: TAMIKA NOU is a 52 y.o. male with medical history significant of alcoholic liver cirrhosis with portal gastropathy, s/p of TIPS, superior mesenteric artery stenosis, HTN, HLD, CAD, STEMI, dCHF, GERD, depression with anxiety, adrenal insufficiency (Addison's disease), migraine headache, cardiac arrest due to V-fib, GI bleeding, polysubstance abuse, cocaine abuse, tobacco abuse, who presents with nausea, vomiting, diarrhea, abdominal pain, headache.  Patient is treated symptomatically.  Patient also had a CT scan showed a large liver mass, MRI confirmed but no change since 01/2023.  7/17.  Patient having some abdominal discomfort and nausea did not eat much this morning.  Not feeling well.  Alpha-fetoprotein still pending.  Assessment and Plan: * Abdominal pain Patient still having some abdominal pain.  Nausea vomiting and diarrhea No further diarrhea but still with poor appetite still with some nausea.  Liver mass Awaiting alpha-fetoprotein on whether patient needs a biopsy or not.  Patient states he was not taking Plavix as outpatient so we will hold this just in case.  Adrenal insufficiency (Addison's disease) (HCC) Continue Cortef  CAD (coronary artery disease) Patient states that he has not been taking Plavix as outpatient.  Chronic diastolic CHF (congestive heart failure) (HCC) No current signs of heart failure  HLD (hyperlipidemia) Continue Crestor  Hypokalemia Replaced  Alcohol abuse No signs of withdrawal  Elevated liver function tests Likely secondary to cirrhosis and alcohol.  Pancytopenia (HCC) Secondary to liver cirrhosis.  Platelet count 71 white blood cell count 2.3 and hemoglobin 9.3.        Subjective: Patient not feeling well this morning.  Feeling nauseous.  Did not eat.   He states he has not been taking Plavix as outpatient.  Now with abdominal pain nausea vomiting and diarrhea.  Physical Exam: Vitals:   07/02/23 0500 07/02/23 0534 07/02/23 0838 07/02/23 1704  BP:  106/68 119/68 (!) 89/55  Pulse:  90 92 98  Resp:  18 16 16   Temp:  98.4 F (36.9 C) 98.6 F (37 C) 98.9 F (37.2 C)  TempSrc:    Oral  SpO2:  100% 99% 97%  Weight: 84.6 kg      Physical Exam HENT:     Head: Normocephalic.     Mouth/Throat:     Pharynx: No oropharyngeal exudate.  Eyes:     General: Lids are normal.     Conjunctiva/sclera: Conjunctivae normal.  Cardiovascular:     Rate and Rhythm: Normal rate and regular rhythm.     Heart sounds: Normal heart sounds, S1 normal and S2 normal.  Pulmonary:     Breath sounds: No decreased breath sounds, wheezing, rhonchi or rales.  Abdominal:     Palpations: Abdomen is soft.     Tenderness: There is generalized abdominal tenderness.  Musculoskeletal:     Right lower leg: Swelling present.     Left lower leg: Swelling present.  Skin:    General: Skin is warm.     Findings: No rash.  Neurological:     Mental Status: He is alert and oriented to person, place, and time.     Data Reviewed: White blood cell count 2.3, hemoglobin 9.3, platelet count 71, creatinine 0.57, alkaline phosphatase 248, AST 75, total bilirubin 4.3    Disposition: Status is: Observation Patient still not eating very well.  Awaiting alpha-fetoprotein.  Planned Discharge  Destination: Home    Time spent: 28 minutes  Author: Alford Highland, MD 07/02/2023 5:45 PM  For on call review [email or web address].

## 2023-07-02 NOTE — Assessment & Plan Note (Signed)
Replaced. °

## 2023-07-02 NOTE — Assessment & Plan Note (Addendum)
Alpha-fetoprotein low at 3.5.  Will need to follow-up with oncology for further imaging as outpatient.

## 2023-07-02 NOTE — Assessment & Plan Note (Addendum)
Patient still having some abdominal pain and still not eating very well.  Patient feeling well enough to go home.  Continue Protonix.  Alcohol cessation.

## 2023-07-02 NOTE — Assessment & Plan Note (Signed)
 No current signs of heart failure

## 2023-07-02 NOTE — Discharge Instructions (Signed)
                  Intensive Outpatient Programs  [facility]    [facility] [address]     [address] [place],  [place]     [place], [place] [phone]      [phone]  [facility]   [facility]  (Inpatient and outpatient)  [phone] (Suboxone and Methadone) [address]           [phone]           [facility]    [facility] - Intensive Outpatient [address]     [address] [place], [place] [address]     [place], [place]  [phone]      [phone]  [facility] (Outpatient, Inpatient, [facility]  [facility] (Groups and [facility]) (insurance only) [phone]    [place], [place]          [phone]       [facility]    [facility] (for caregivers and family) [address]     [address] [place], [place]     [place], [place] [phone]      [phone]  Residential Treatment Programs  [facility]  [facility](2 years) Residential: 90 days)  [facility] [address]      [address] [place], [place]     [place], [place] [phone]      [phone] or [phone]  [facility]    [facility] [address]      [address] [place], [place]     [place], [place] [phone]      [phone]  [facility]   [facility] ([facility] [address]     [address] [place], [place] [address]     [place], [place] [phone]      [phone] Admissions: 8am-3pm M-F  [facility]: Residential Program (90 Days)              [facility]: [facility]  [place], [place]     [place],   [phone] or [phone]    (Walk in Hours over the weekend or by referral)   [facility]: [facility]:[phone] (for crisis  response 24 hours a day) 

## 2023-07-02 NOTE — Assessment & Plan Note (Addendum)
No further diarrhea but still with poor appetite still with some nausea.  As needed nausea medication.

## 2023-07-02 NOTE — Assessment & Plan Note (Addendum)
Patient states the Ativan helps him.  I told him that I do not like to prescribe this if he goes back on alcohol.

## 2023-07-02 NOTE — TOC Progression Note (Signed)
Transition of Care [facility]) - Progression Note    Patient Details  Name: Ethan Wells MRN: [id number] Date of Birth: [birth year]  [facility]) CM/SW Contact  Garret Reddish, RN Phone Number: 07/02/2023, 3:55 PM  Clinical Narrative:   Chart reviewed.  Noted that patient was admitted with Abdominal pain.  Noted Oncology has consulted on patient for liver mass.    I have added substance abuse resources to patient's discharge instructions.    TOC will continue to follow for any discharge needs.           Expected Discharge Plan and Services                                               Social Determinants of Health (SDOH) Interventions SDOH Screenings   Food Insecurity: No Food Insecurity (06/30/2023)  Housing: Patient Unable To Answer (06/30/2023)  Transportation Needs: No Transportation Needs (06/30/2023)  Utilities: Not At Risk (06/30/2023)  Alcohol Screen: Medium Risk (10/01/2022)  Tobacco Use: High Risk (06/30/2023)    Readmission Risk Interventions    02/24/2023    3:12 PM 01/17/2023    3:38 PM 10/22/2022   10:47 AM  Readmission Risk Prevention Plan  Transportation Screening Complete Complete Complete  PCP or Specialist Appt within 3-5 Days   Complete  Social Work Consult for Recovery Care Planning/Counseling   Complete  Palliative Care Screening   Not Applicable  Medication Review Oceanographer) Complete Complete Complete  SW Recovery Care/Counseling Consult  Not Complete   SW Consult Not Complete Comments  lethargic   Palliative Care Screening Not Applicable Not Applicable   Skilled Nursing Facility Not Applicable Not Applicable

## 2023-07-02 NOTE — Assessment & Plan Note (Signed)
Likely secondary to cirrhosis and alcohol.

## 2023-07-02 NOTE — Assessment & Plan Note (Addendum)
Secondary to liver cirrhosis.  Platelet count 101, white blood cell count 4.2 and hemoglobin 9.8.

## 2023-07-02 NOTE — Assessment & Plan Note (Signed)
 Continue Crestor 

## 2023-07-03 ENCOUNTER — Other Ambulatory Visit: Payer: Self-pay | Admitting: Orthopedic Surgery

## 2023-07-03 DIAGNOSIS — R112 Nausea with vomiting, unspecified: Secondary | ICD-10-CM | POA: Diagnosis not present

## 2023-07-03 DIAGNOSIS — D61818 Other pancytopenia: Secondary | ICD-10-CM | POA: Diagnosis present

## 2023-07-03 DIAGNOSIS — Z955 Presence of coronary angioplasty implant and graft: Secondary | ICD-10-CM | POA: Diagnosis not present

## 2023-07-03 DIAGNOSIS — K760 Fatty (change of) liver, not elsewhere classified: Secondary | ICD-10-CM | POA: Diagnosis present

## 2023-07-03 DIAGNOSIS — G43909 Migraine, unspecified, not intractable, without status migrainosus: Secondary | ICD-10-CM | POA: Diagnosis present

## 2023-07-03 DIAGNOSIS — D6959 Other secondary thrombocytopenia: Secondary | ICD-10-CM | POA: Diagnosis present

## 2023-07-03 DIAGNOSIS — E876 Hypokalemia: Secondary | ICD-10-CM | POA: Diagnosis present

## 2023-07-03 DIAGNOSIS — I2510 Atherosclerotic heart disease of native coronary artery without angina pectoris: Secondary | ICD-10-CM | POA: Diagnosis present

## 2023-07-03 DIAGNOSIS — E785 Hyperlipidemia, unspecified: Secondary | ICD-10-CM | POA: Diagnosis present

## 2023-07-03 DIAGNOSIS — R109 Unspecified abdominal pain: Secondary | ICD-10-CM | POA: Diagnosis present

## 2023-07-03 DIAGNOSIS — E271 Primary adrenocortical insufficiency: Secondary | ICD-10-CM | POA: Diagnosis present

## 2023-07-03 DIAGNOSIS — F1410 Cocaine abuse, uncomplicated: Secondary | ICD-10-CM | POA: Diagnosis present

## 2023-07-03 DIAGNOSIS — I255 Ischemic cardiomyopathy: Secondary | ICD-10-CM | POA: Diagnosis present

## 2023-07-03 DIAGNOSIS — I110 Hypertensive heart disease with heart failure: Secondary | ICD-10-CM | POA: Diagnosis present

## 2023-07-03 DIAGNOSIS — F419 Anxiety disorder, unspecified: Secondary | ICD-10-CM | POA: Diagnosis present

## 2023-07-03 DIAGNOSIS — Y908 Blood alcohol level of 240 mg/100 ml or more: Secondary | ICD-10-CM | POA: Diagnosis present

## 2023-07-03 DIAGNOSIS — K219 Gastro-esophageal reflux disease without esophagitis: Secondary | ICD-10-CM | POA: Diagnosis present

## 2023-07-03 DIAGNOSIS — I5032 Chronic diastolic (congestive) heart failure: Secondary | ICD-10-CM | POA: Diagnosis present

## 2023-07-03 DIAGNOSIS — R160 Hepatomegaly, not elsewhere classified: Secondary | ICD-10-CM | POA: Diagnosis not present

## 2023-07-03 DIAGNOSIS — F1010 Alcohol abuse, uncomplicated: Secondary | ICD-10-CM | POA: Diagnosis present

## 2023-07-03 DIAGNOSIS — R1084 Generalized abdominal pain: Secondary | ICD-10-CM | POA: Diagnosis not present

## 2023-07-03 DIAGNOSIS — G8929 Other chronic pain: Secondary | ICD-10-CM | POA: Diagnosis present

## 2023-07-03 DIAGNOSIS — F32A Depression, unspecified: Secondary | ICD-10-CM | POA: Diagnosis present

## 2023-07-03 DIAGNOSIS — E872 Acidosis, unspecified: Secondary | ICD-10-CM | POA: Diagnosis present

## 2023-07-03 DIAGNOSIS — R161 Splenomegaly, not elsewhere classified: Secondary | ICD-10-CM | POA: Diagnosis present

## 2023-07-03 DIAGNOSIS — F17210 Nicotine dependence, cigarettes, uncomplicated: Secondary | ICD-10-CM | POA: Diagnosis present

## 2023-07-03 DIAGNOSIS — R1013 Epigastric pain: Secondary | ICD-10-CM | POA: Diagnosis not present

## 2023-07-03 DIAGNOSIS — Z8249 Family history of ischemic heart disease and other diseases of the circulatory system: Secondary | ICD-10-CM | POA: Diagnosis not present

## 2023-07-03 DIAGNOSIS — K7030 Alcoholic cirrhosis of liver without ascites: Secondary | ICD-10-CM | POA: Diagnosis present

## 2023-07-03 MED ORDER — MAGNESIUM SULFATE 2 GM/50ML IV SOLN
2.0000 g | Freq: Once | INTRAVENOUS | Status: AC
  Administered 2023-07-03: 2 g via INTRAVENOUS
  Filled 2023-07-03: qty 50

## 2023-07-03 MED ORDER — SODIUM CHLORIDE 0.9 % IV SOLN: INTRAVENOUS | Status: DC | PRN

## 2023-07-03 NOTE — Plan of Care (Signed)
  Problem: Education: Goal: Knowledge of General Education information will improve Description: Including pain rating scale, medication(s)/side effects and non-pharmacologic comfort measures Outcome: Progressing   Problem: Health Behavior/Discharge Planning: Goal: Ability to manage health-related needs will improve Outcome: Progressing   Problem: Clinical Measurements: Goal: Will remain free from infection Outcome: Progressing Goal: Diagnostic test results will improve Outcome: Progressing   Problem: Nutrition: Goal: Adequate nutrition will be maintained Outcome: Progressing   

## 2023-07-03 NOTE — Progress Notes (Signed)
Progress Note   [name] VZD:[id number] DOB: 19-Aug-1971 DOA: 06/30/2023     0 DOS: the patient was seen and examined on 07/03/2023   Brief hospital course: GORDY GOAR is a 52 y.o. male with medical history significant of alcoholic liver cirrhosis with portal gastropathy, s/p of TIPS, superior mesenteric artery stenosis, HTN, HLD, CAD, STEMI, dCHF, GERD, depression with anxiety, adrenal insufficiency (Addison's disease), migraine headache, cardiac arrest due to V-fib, GI bleeding, polysubstance abuse, cocaine abuse, tobacco abuse, who presents with nausea, vomiting, diarrhea, abdominal pain, headache.  Patient is treated symptomatically.  Patient also had a CT scan showed a large liver mass, MRI confirmed but no change since 01/2023.  7/17.  Patient having some abdominal discomfort and nausea did not eat much this morning.  Not feeling well.  Alpha-fetoprotein still pending. 7/18.  Patient still not feeling well.  Not eating very well.  Still having abdominal pain.  Alpha-fetoprotein came back low at 3.2.  Assessment and Plan: * Abdominal pain Patient still having some abdominal pain and still not eating very well.  Monitor how he eats today.  On regular diet.  Nausea vomiting and diarrhea No further diarrhea but still with poor appetite still with some nausea.  Liver mass Alpha-fetoprotein low at 3.5.  Will need to follow-up with oncology as outpatient.  Adrenal insufficiency (Addison's disease) (HCC) Continue Cortef  CAD (coronary artery disease) Patient states that he has not been taking Plavix as outpatient.  Chronic diastolic CHF (congestive heart failure) (HCC) No current signs of heart failure  HLD (hyperlipidemia) Continue Crestor  Hypokalemia Replaced  Alcohol abuse Patient states the Ativan helps him.  I told him that I do not like to prescribe this if he goes back on alcohol.  Elevated liver function tests Likely secondary to cirrhosis and  alcohol.  Pancytopenia (HCC) Secondary to liver cirrhosis.  Platelet count 71 white blood cell count 2.3 and hemoglobin 9.3.        Subjective: Patient not feeling well.  Still having abdominal pain.  Not eating very well.  Admitted with abdominal pain found to have a liver mass.  Physical Exam: Vitals:   07/03/23 0503 07/03/23 0752 07/03/23 1000 07/03/23 1606  BP: 108/60 (!) 99/55 (!) 101/52 (!) 109/58  Pulse: 94 93 98 93  Resp:  18  16  Temp: 97.8 F (36.6 C) 97.7 F (36.5 C)  98.1 F (36.7 C)  TempSrc:    Oral  SpO2: 98% 98%  98%  Weight:      Height:       Physical Exam HENT:     Head: Normocephalic.     Mouth/Throat:     Pharynx: No oropharyngeal exudate.  Eyes:     General: Lids are normal.     Conjunctiva/sclera: Conjunctivae normal.  Cardiovascular:     Rate and Rhythm: Normal rate and regular rhythm.     Heart sounds: Normal heart sounds, S1 normal and S2 normal.  Pulmonary:     Breath sounds: No decreased breath sounds, wheezing, rhonchi or rales.  Abdominal:     Palpations: Abdomen is soft.     Tenderness: There is generalized abdominal tenderness.  Musculoskeletal:     Right lower leg: Swelling present.     Left lower leg: Swelling present.  Skin:    General: Skin is warm.     Findings: No rash.  Neurological:     Mental Status: He is alert and oriented to person, place, and time.  Data Reviewed: Creatinine 0.57, total bilirubin 4.3, hemoglobin 9.3, white blood cell count 2.3, platelet count 71  Disposition: Status is: Inpatient Remains inpatient appropriate because: Observe how he eats today.  Planned Discharge Destination: Home    Time spent: 28 minutes  Author: Alford Highland, MD 07/03/2023 4:45 PM  For on call review [email or web address].

## 2023-07-03 NOTE — Plan of Care (Signed)
  Problem: Clinical Measurements: Goal: Ability to maintain clinical measurements within normal limits will improve Outcome: Progressing   Problem: Activity: Goal: Risk for activity intolerance will decrease Outcome: Progressing   Problem: Elimination: Goal: Will not experience complications related to bowel motility Outcome: Progressing Goal: Will not experience complications related to urinary retention Outcome: Progressing

## 2023-07-04 DIAGNOSIS — R112 Nausea with vomiting, unspecified: Secondary | ICD-10-CM | POA: Diagnosis not present

## 2023-07-04 DIAGNOSIS — R160 Hepatomegaly, not elsewhere classified: Secondary | ICD-10-CM | POA: Diagnosis not present

## 2023-07-04 DIAGNOSIS — I2510 Atherosclerotic heart disease of native coronary artery without angina pectoris: Secondary | ICD-10-CM | POA: Diagnosis not present

## 2023-07-04 DIAGNOSIS — R1084 Generalized abdominal pain: Secondary | ICD-10-CM | POA: Diagnosis not present

## 2023-07-04 LAB — COMPREHENSIVE METABOLIC PANEL
ALT: 19 U/L (ref 0–44)
AST: 62 U/L — ABNORMAL HIGH (ref 15–41)
Albumin: 2.7 g/dL — ABNORMAL LOW (ref 3.5–5.0)
Alkaline Phosphatase: 266 U/L — ABNORMAL HIGH (ref 38–126)
Anion gap: 6 (ref 5–15)
BUN: 6 mg/dL (ref 6–20)
CO2: 21 mmol/L — ABNORMAL LOW (ref 22–32)
Calcium: 8.6 mg/dL — ABNORMAL LOW (ref 8.9–10.3)
Chloride: 112 mmol/L — ABNORMAL HIGH (ref 98–111)
Creatinine, Ser: 0.63 mg/dL (ref 0.61–1.24)
GFR, Estimated: >60 mL/min (ref >60)
Glucose, Bld: 102 mg/dL — ABNORMAL HIGH (ref 70–99)
Potassium: 4 mmol/L (ref 3.5–5.1)
Sodium: 139 mmol/L (ref 135–145)
Total Bilirubin: 2.7 mg/dL — ABNORMAL HIGH (ref 0.3–1.2)
Total Protein: 6.5 g/dL (ref 6.5–8.1)

## 2023-07-04 LAB — CBC
HCT: 30.7 % — ABNORMAL LOW (ref 39.0–52.0)
Hemoglobin: 9.8 g/dL — ABNORMAL LOW (ref 13.0–17.0)
MCH: 29.4 pg (ref 26.0–34.0)
MCHC: 31.9 g/dL (ref 30.0–36.0)
MCV: 92.2 fL (ref 80.0–100.0)
Platelets: 101 10*3/uL — ABNORMAL LOW (ref 150–400)
RBC: 3.33 MIL/uL — ABNORMAL LOW (ref 4.22–5.81)
RDW: 17.2 % — ABNORMAL HIGH (ref 11.5–15.5)
WBC: 4.2 10*3/uL (ref 4.0–10.5)
nRBC: 0 % (ref 0.0–0.2)

## 2023-07-04 MED ORDER — LORAZEPAM 0.5 MG PO TABS
0.5000 mg | ORAL_TABLET | Freq: Three times a day (TID) | ORAL | Status: DC
  Administered 2023-07-04 – 2023-07-05 (×3): 0.5 mg via ORAL
  Filled 2023-07-04 (×3): qty 1

## 2023-07-04 NOTE — TOC Progression Note (Signed)
Transition of Care [facility]) - Progression Note    Patient Details  Name: MATTHEUS RAULS MRN: [id number] Date of Birth: Aug 29, 1971  [facility]) CM/SW Contact  Garret Reddish, RN Phone Number: 07/04/2023, 1:16 PM  Clinical Narrative:   Chart reviewed. Patient continues to have abdominal pain, and not feel well.  Alpha-fetoprotein came back low at 3.2.   TOC will continue to follow for discharge planning.           Expected Discharge Plan and Services                                               Social Determinants of Health (SDOH) Interventions SDOH Screenings   Food Insecurity: No Food Insecurity (06/30/2023)  Housing: Patient Unable To Answer (06/30/2023)  Transportation Needs: No Transportation Needs (06/30/2023)  Utilities: Not At Risk (06/30/2023)  Alcohol Screen: Medium Risk (10/01/2022)  Tobacco Use: High Risk (06/30/2023)    Readmission Risk Interventions    02/24/2023    3:12 PM 01/17/2023    3:38 PM 10/22/2022   10:47 AM  Readmission Risk Prevention Plan  Transportation Screening Complete Complete Complete  PCP or Specialist Appt within 3-5 Days   Complete  Social Work Consult for Recovery Care Planning/Counseling   Complete  Palliative Care Screening   Not Applicable  Medication Review Oceanographer) Complete Complete Complete  SW Recovery Care/Counseling Consult  Not Complete   SW Consult Not Complete Comments  lethargic   Palliative Care Screening Not Applicable Not Applicable   Skilled Nursing Facility Not Applicable Not Applicable

## 2023-07-04 NOTE — Progress Notes (Signed)
Progress Note   [name]:[id number] DOB: 01-28-1971 DOA: 06/30/2023     1 DOS: the patient was seen and examined on 07/04/2023   Brief hospital course: Ethan Wells is a 52 y.o. male with medical history significant of alcoholic liver cirrhosis with portal gastropathy, s/p of TIPS, superior mesenteric artery stenosis, HTN, HLD, CAD, STEMI, dCHF, GERD, depression with anxiety, adrenal insufficiency (Addison's disease), migraine headache, cardiac arrest due to V-fib, GI bleeding, polysubstance abuse, cocaine abuse, tobacco abuse, who presents with nausea, vomiting, diarrhea, abdominal pain, headache.  Patient is treated symptomatically.  Patient also had a CT scan showed a large liver mass, MRI confirmed but no change since 01/2023.  7/17.  Patient having some abdominal discomfort and nausea did not eat much this morning.  Not feeling well.  Alpha-fetoprotein still pending. 7/18.  Patient still not feeling well.  Not eating very well.  Still having abdominal pain.  Alpha-fetoprotein came back low at 3.2.  Assessment and Plan: * Abdominal pain Patient still having some abdominal pain and still not eating very well.  Monitor how he eats today.  On regular diet.  Hopefully home tomorrow. Hopefully home tomorrow.  Nausea vomiting and diarrhea No further diarrhea but still with poor appetite still with some nausea.  Liver mass Alpha-fetoprotein low at 3.5.  Will need to follow-up with oncology for further imaging as outpatient.  Adrenal insufficiency (Addison's disease) (HCC) Continue Cortef  CAD (coronary artery disease) Patient states that he has not been taking Plavix as outpatient.  Chronic diastolic CHF (congestive heart failure) (HCC) No current signs of heart failure  HLD (hyperlipidemia) Continue Crestor  Hypokalemia Replaced  Alcohol abuse Patient states the Ativan helps him.  I told him that I do not like to prescribe this if he goes back on  alcohol.  Elevated liver function tests Likely secondary to cirrhosis and alcohol.  Pancytopenia (HCC) Secondary to liver cirrhosis.  Platelet count 101 white blood cell count 4.3 and hemoglobin 9.8.        Subjective: Patient seen this morning.  Still having abdominal pain.  Still not feeling well.  Still not eating well.  Physical Exam: Vitals:   07/03/23 1949 07/04/23 0220 07/04/23 0551 07/04/23 0850  BP: (!) 96/56  (!) 100/56 105/63  Pulse: 95  93 92  Resp: 16  16 17   Temp: 97.8 F (36.6 C)  98 F (36.7 C) 98.3 F (36.8 C)  TempSrc:   Oral Oral  SpO2: 100%  (!) 16% 97%  Weight:  82.9 kg    Height:       Physical Exam HENT:     Head: Normocephalic.     Mouth/Throat:     Pharynx: No oropharyngeal exudate.  Eyes:     General: Lids are normal.     Conjunctiva/sclera: Conjunctivae normal.  Cardiovascular:     Rate and Rhythm: Normal rate and regular rhythm.     Heart sounds: Normal heart sounds, S1 normal and S2 normal.  Pulmonary:     Breath sounds: No decreased breath sounds, wheezing, rhonchi or rales.  Abdominal:     Palpations: Abdomen is soft.     Tenderness: There is generalized abdominal tenderness.  Musculoskeletal:     Right lower leg: Swelling present.     Left lower leg: Swelling present.  Skin:    General: Skin is warm.     Findings: No rash.  Neurological:     Mental Status: He is alert and oriented to  person, place, and time.     Data Reviewed: Creatinine 0.63, albumin 2.7, AST 62, total bilirubin 2.7, hemoglobin 9.8, white blood count 4.2, platelet count 101  Disposition: Status is: Inpatient Remains inpatient appropriate because: Hoping to discharge back to hotel tomorrow  Planned Discharge Destination: Home    Time spent: 28 minutes  Author: Alford Highland, MD 07/04/2023 3:24 PM  For on call review [email or web address].

## 2023-07-05 DIAGNOSIS — R160 Hepatomegaly, not elsewhere classified: Secondary | ICD-10-CM | POA: Diagnosis not present

## 2023-07-05 DIAGNOSIS — E271 Primary adrenocortical insufficiency: Secondary | ICD-10-CM | POA: Diagnosis not present

## 2023-07-05 DIAGNOSIS — R1013 Epigastric pain: Secondary | ICD-10-CM | POA: Diagnosis not present

## 2023-07-05 DIAGNOSIS — R112 Nausea with vomiting, unspecified: Secondary | ICD-10-CM | POA: Diagnosis not present

## 2023-07-05 MED ORDER — FOLIC ACID 1 MG PO TABS: 1.0000 mg | ORAL_TABLET | Freq: Every day | ORAL | 0 refills | Status: DC

## 2023-07-05 MED ORDER — NICOTINE 21 MG/24HR TD PT24: MEDICATED_PATCH | TRANSDERMAL | 0 refills | Status: DC

## 2023-07-05 MED ORDER — GABAPENTIN 100 MG PO CAPS
100.0000 mg | ORAL_CAPSULE | Freq: Two times a day (BID) | ORAL | 0 refills | Status: DC
Start: 2023-07-05 — End: 2023-09-08

## 2023-07-05 MED ORDER — ROSUVASTATIN CALCIUM 40 MG PO TABS
40.0000 mg | ORAL_TABLET | Freq: Every day | ORAL | 0 refills | Status: DC
Start: 2023-07-05 — End: 2023-07-28

## 2023-07-05 MED ORDER — ADULT MULTIVITAMIN W/MINERALS CH: 1.0000 | ORAL_TABLET | Freq: Every day | ORAL | Status: DC

## 2023-07-05 MED ORDER — QUETIAPINE FUMARATE 50 MG PO TABS: 50.0000 mg | ORAL_TABLET | Freq: Every day | ORAL | 0 refills | Status: DC

## 2023-07-05 MED ORDER — THIAMINE HCL 100 MG PO TABS: 100.0000 mg | ORAL_TABLET | Freq: Every day | ORAL | 0 refills | Status: DC

## 2023-07-05 MED ORDER — CLOPIDOGREL BISULFATE 75 MG PO TABS: 75.0000 mg | ORAL_TABLET | Freq: Every day | ORAL | 0 refills | Status: DC

## 2023-07-05 MED ORDER — ONDANSETRON HCL 4 MG PO TABS: 4.0000 mg | ORAL_TABLET | Freq: Three times a day (TID) | ORAL | 0 refills | Status: DC | PRN

## 2023-07-05 MED ORDER — HYDROCORTISONE 5 MG PO TABS: 5.0000 mg | ORAL_TABLET | Freq: Two times a day (BID) | ORAL | 0 refills | Status: AC

## 2023-07-05 MED ORDER — PANTOPRAZOLE SODIUM 40 MG PO TBEC
40.0000 mg | DELAYED_RELEASE_TABLET | Freq: Two times a day (BID) | ORAL | 0 refills | Status: DC
Start: 2023-07-05 — End: 2023-09-08

## 2023-07-05 MED ORDER — OXYCODONE HCL 5 MG PO TABS: 5.0000 mg | ORAL_TABLET | Freq: Four times a day (QID) | ORAL | 0 refills | Status: DC | PRN

## 2023-07-05 MED ORDER — NITROGLYCERIN 0.4 MG SL SUBL: 0.4000 mg | SUBLINGUAL_TABLET | SUBLINGUAL | 0 refills | Status: DC | PRN

## 2023-07-05 MED ORDER — LORAZEPAM 0.5 MG PO TABS: ORAL_TABLET | ORAL | 0 refills | Status: DC

## 2023-07-05 MED ORDER — METHOCARBAMOL 750 MG PO TABS: 750.0000 mg | ORAL_TABLET | Freq: Two times a day (BID) | ORAL | 0 refills | Status: DC | PRN

## 2023-07-05 MED ORDER — RIFAXIMIN 550 MG PO TABS: 550.0000 mg | ORAL_TABLET | Freq: Two times a day (BID) | ORAL | 0 refills | Status: DC

## 2023-07-05 NOTE — Discharge Summary (Signed)
Physician Discharge Summary   [name] MRN: [id number] DOB: 09-15-1971  Admit date:     06/30/2023  Discharge date: 07/05/23  Discharge Physician: Alford Highland   PCP: [name], NP   Recommendations at discharge:   Follow-up PCP 5 days Consider naltrexone as outpatient for alcohol cessation.  Follow-up oncology 1 month Follow-up gastroenterology Follow-up cardiology  Discharge Diagnoses: Principal Problem:   Abdominal pain Active Problems:   Nausea vomiting and diarrhea   Adrenal insufficiency (Addison's disease) (HCC)   Liver mass   CAD (coronary artery disease)   HLD (hyperlipidemia)   Chronic diastolic CHF (congestive heart failure) (HCC)   Hypokalemia   Migraine   Depression with anxiety   Polysubstance abuse (HCC)   Tobacco abuse   Cocaine abuse (HCC)   Alcohol abuse   Pancytopenia (HCC)   Metabolic acidosis   Elevated liver function tests    Hospital Course: Ethan Wells is a 52 y.o. male with medical history significant of alcoholic liver cirrhosis with portal gastropathy, s/p of TIPS, superior mesenteric artery stenosis, HTN, HLD, CAD, STEMI, dCHF, GERD, depression with anxiety, adrenal insufficiency (Addison's disease), migraine headache, cardiac arrest due to V-fib, GI bleeding, polysubstance abuse, cocaine abuse, tobacco abuse, who presents with nausea, vomiting, diarrhea, abdominal pain, headache.  Patient is treated symptomatically.  Patient also had a CT scan showed a large liver mass, MRI confirmed but no change since 01/2023.  7/17.  Patient having some abdominal discomfort and nausea did not eat much this morning.  Not feeling well.  Alpha-fetoprotein still pending. 7/18.  Patient still not feeling well.  Not eating very well.  Still having abdominal pain.  Alpha-fetoprotein came back low at 3.2. 7/19.  Patient still with poor appetite and abdominal discomfort 7/20.  Patient not feeling well but okay to go home.   Wanted some Ativan to go home with and also pain medication.  Assessment and Plan: * Abdominal pain Patient still having some abdominal pain and still not eating very well.  Patient feeling well enough to go home.  Continue Protonix.  Alcohol cessation.  Nausea vomiting and diarrhea No further diarrhea but still with poor appetite still with some nausea.  As needed nausea medication.  Liver mass Alpha-fetoprotein low at 3.5.  Will need to follow-up with oncology for further imaging as outpatient.  Adrenal insufficiency (Addison's disease) (HCC) Continue Cortef  CAD (coronary artery disease) Patient states that he has not been taking Plavix as outpatient.  Plavix prescribed.  Chronic diastolic CHF (congestive heart failure) (HCC) No current signs of heart failure  HLD (hyperlipidemia) Continue Crestor  Hypokalemia Replaced  Depression with anxiety On Seroquel  Alcohol abuse Patient states the Ativan helps him.  I told him that I do not like to prescribe this if he goes back on alcohol.  Consider naltrexone as outpatient after he is off pain medications.  Tobacco abuse Nicotine patch  Elevated liver function tests Likely secondary to cirrhosis and alcohol.  Pancytopenia (HCC) Secondary to liver cirrhosis.  Platelet count 101, white blood cell count 4.2 and hemoglobin 9.8.         Consultants: None Procedures performed: None Disposition: Home Diet recommendation:  Cardiac diet DISCHARGE MEDICATION: Allergies as of 07/05/2023       Reactions   Penicillins Hives, Rash   Tolerated Ceftriaxone 10/2022        Medication List     STOP taking these medications    midodrine 10 MG tablet Commonly known as:  PROAMATINE       TAKE these medications    clopidogrel 75 MG tablet Commonly known as: PLAVIX Take 1 tablet (75 mg total) by mouth daily.   folic acid 1 MG tablet Commonly known as: FOLVITE Take 1 tablet (1 mg total) by mouth daily.    gabapentin 100 MG capsule Commonly known as: NEURONTIN Take 1 capsule (100 mg total) by mouth 2 (two) times daily.   hydrocortisone 5 MG tablet Commonly known as: Cortef Take 1 tablet (5 mg total) by mouth 2 (two) times daily. What changed:  medication strength when to take this   LORazepam 0.5 MG tablet Commonly known as: ATIVAN One tab po three times a day for two days then one tab twice a day for two days then one tab po daily for three days   methocarbamol 750 MG tablet Commonly known as: ROBAXIN Take 1 tablet (750 mg total) by mouth 2 (two) times daily as needed for muscle spasms.   multivitamin with minerals Tabs tablet Take 1 tablet by mouth daily.   nicotine 21 mg/24hr patch Commonly known as: NICODERM CQ - dosed in mg/24 hours One 21 mg patch chest wall daily (okay to substitute generic)   nitroGLYCERIN 0.4 MG SL tablet Commonly known as: NITROSTAT Place 1 tablet (0.4 mg total) under the tongue every 5 (five) minutes as needed for chest pain.   ondansetron 4 MG tablet Commonly known as: Zofran Take 1 tablet (4 mg total) by mouth every 8 (eight) hours as needed for nausea or vomiting.   oxyCODONE 5 MG immediate release tablet Commonly known as: Oxy IR/ROXICODONE Take 1 tablet (5 mg total) by mouth every 6 (six) hours as needed for moderate pain.   pantoprazole 40 MG tablet Commonly known as: PROTONIX Take 1 tablet (40 mg total) by mouth 2 (two) times daily.   QUEtiapine 50 MG tablet Commonly known as: SEROQUEL Take 1 tablet (50 mg total) by mouth at bedtime.   rifaximin 550 MG Tabs tablet Commonly known as: XIFAXAN Take 1 tablet (550 mg total) by mouth 2 (two) times daily.   rosuvastatin 40 MG tablet Commonly known as: CRESTOR Take 1 tablet (40 mg total) by mouth daily.   thiamine 100 MG tablet Commonly known as: VITAMIN B1 Take 1 tablet (100 mg total) by mouth daily.        Follow-up Information     Medina-Vargas, Monina C, NP Follow up in 5  day(s).   Specialty: Internal Medicine Contact information: [address] [place] [address] [phone]         Rickard Patience, MD Follow up in 1 month(s).   Specialty: Oncology Why: liver mass Contact information: [address] [place] [address] [phone]         Toney Reil, MD Follow up in 1 month(s).   Specialty: Gastroenterology Contact information: [address] [place] [address] [phone]         Iran Ouch, MD Follow up in 2 week(s).   Specialty: Cardiology Contact information: [address] [place] [address] [phone]                Discharge Exam: Ceasar Mons Weights   07/03/23 0500 07/04/23 0220 07/05/23 0500  Weight: 85 kg 82.9 kg 83.2 kg   Physical Exam HENT:     Head: Normocephalic.     Mouth/Throat:     Pharynx: No oropharyngeal exudate.  Eyes:     General:  Lids are normal.     Conjunctiva/sclera: Conjunctivae normal.  Cardiovascular:     Rate and Rhythm: Normal rate and regular rhythm.     Heart sounds: Normal heart sounds, S1 normal and S2 normal.  Pulmonary:     Breath sounds: No decreased breath sounds, wheezing, rhonchi or rales.  Abdominal:     Palpations: Abdomen is soft.     Tenderness: There is abdominal tenderness in the epigastric area.  Musculoskeletal:     Right lower leg: Swelling present.     Left lower leg: Swelling present.  Skin:    General: Skin is warm.     Findings: No rash.  Neurological:     Mental Status: He is alert and oriented to person, place, and time.      Condition at discharge: fair  The results of significant diagnostics from this hospitalization (including imaging, microbiology, ancillary and laboratory) are listed below for reference.   Imaging Studies: MR ABDOMEN W WO CONTRAST  Result Date: 07/01/2023 CLINICAL DATA:  52 year old male with cirrhosis. Evaluate for hepatocellular carcinoma. EXAM: MRI ABDOMEN WITHOUT AND WITH  CONTRAST TECHNIQUE: Multiplanar multisequence MR imaging of the abdomen was performed both before and after the administration of intravenous contrast. CONTRAST:  8mL GADAVIST GADOBUTROL 1 MMOL/ML IV SOLN COMPARISON:  CT AP 06/30/2023. FINDINGS: Lower chest: No acute findings. Hepatobiliary: The liver has a nodular contour with relative hypertrophy of the caudate lobe and lateral segment of left hepatic lobe. There is diffuse hepatic steatosis. A tips shunt is identified. Motion artifact diminishes exam detail on the postcontrast images. The arterial phase images are suboptimal due to timing of the contrast bolus. T2 isointense, and slightly T1 hyperintense exophytic lesion arising from the inferior aspect of segment 5 adjacent to the gallbladder fossa is again noted as seen on the CT from earlier today. This measures 5.2 x 4.1 cm, image 56/15. On the arterial phase images this is slightly hypointense to the adjacent liver parenchyma. On the delayed images this lesion remains isointense to slightly hypointense to the adjacent liver. Comparison with study from 09/30/2022 shows a similar sized abnormality without signs of arterial phase hyperenhancement. On 01/17/2023 this measured the same as today's study. No gallstones. Small amount of fluid is identified adjacent to the fundus of the gallbladder. No gallstones or gallbladder wall dilatation. Pancreas: No mass, inflammatory changes, or other parenchymal abnormality identified. Spleen: Spleen measures 14 cm in cranial caudal dimension. No focal splenic lesion. Adrenals/Urinary Tract: No masses identified. No evidence of hydronephrosis. Stomach/Bowel: Visualized portions within the abdomen are unremarkable. Vascular/Lymphatic: Vascular stents identified within the proximal celiac and SMA. Extrahepatic portal vein, portal venous confluence and SMV appear patent. Normal caliber abdominal aorta. Aortic atherosclerotic calcifications. No significant arthropathy. Other:   No significant ascites or focal fluid collections. Musculoskeletal: No suspicious bone lesions identified. IMPRESSION: 1. Cirrhosis and hepatic steatosis.  Tips shunt in place. 2. Exophytic lesion arising from the inferior aspect of segment 5 adjacent to the gallbladder fossa is again noted as seen on the CT from earlier today. This measures 5.2 x 4.1 cm. This lesion is similar in size to study from 01/17/2023. No signs of arterial phase hyperenhancement. In a patient who is at increased risk for cirrhosis imaging findings are indeterminate compatible with LI-RADS category 3 lesion. Recommend correlation with alpha fetoprotein levels. Additionally, follow-up imaging in 3-6 months with repeat liver protocol MRI to assess for threshold growth. 3. Mild splenomegaly. 4. Vascular stents identified within the proximal celiac and SMA.  Electronically Signed   By: Signa Kell M.D.   On: 07/01/2023 08:26   CT ABDOMEN PELVIS W CONTRAST  Result Date: 06/30/2023 CLINICAL DATA:  Abdominal pain, alcohol * Tracking Code: BO * EXAM: CT ABDOMEN AND PELVIS WITH CONTRAST TECHNIQUE: Multidetector CT imaging of the abdomen and pelvis was performed using the standard protocol following bolus administration of intravenous contrast. RADIATION DOSE REDUCTION: This exam was performed according to the departmental dose-optimization program which includes automated exposure control, adjustment of the mA and/or kV according to patient size and/or use of iterative reconstruction technique. CONTRAST:  OMNIPAQUE IOHEXOL 300 MG/ML  SOLN COMPARISON:  CT chest abdomen pelvis, 05/09/2023 FINDINGS: Lower chest: No acute abnormality. Hepatobiliary: Hepatic steatosis. Coarse, nodular contour of the liver with a severely heterogeneous parenchymal background. Masslike, partially exophytic lesion arising from inferior hepatic segment V, adjacent to the gallbladder fossa, measuring 5.7 x 3.9 cm (series 2, image 36). Right hepatic vein TIPS.  Contracted gallbladder. No gallstones or biliary ductal dilatation. Pancreas: Unremarkable. No pancreatic ductal dilatation or surrounding inflammatory changes. Spleen: Normal in size without significant abnormality. Adrenals/Urinary Tract: Adrenal glands are unremarkable. Kidneys are normal, without renal calculi, solid lesion, or hydronephrosis. Bladder is unremarkable. Stomach/Bowel: Stomach is within normal limits. Appendix not clearly visualized. No evidence of bowel wall thickening, distention, or inflammatory changes. Vascular/Lymphatic: Aortic atherosclerosis. Celiac axis and superior mesenteric artery stenting. No enlarged abdominal or pelvic lymph nodes. Reproductive: No mass or other significant abnormality. Other: No abdominal wall hernia or abnormality. No ascites. Musculoskeletal: No acute or significant osseous findings. IMPRESSION: 1. No acute CT findings of the abdomen or pelvis to explain abdominal pain. 2. Cirrhosis and hepatic steatosis. Masslike, partially exophytic lesion arising from inferior hepatic segment V, adjacent to the gallbladder fossa, measuring 5.7 x 3.9 cm. This is suspicious for hepatocellular carcinoma. Recommend further evaluation with contrast enhanced MRI on a nonemergent, outpatient basis. 3. Right hepatic vein TIPS. 4. Celiac axis and superior mesenteric artery stenting. Aortic Atherosclerosis (ICD10-I70.0). Electronically Signed   By: Jearld Lesch M.D.   On: 06/30/2023 15:21   CT Head Wo Contrast  Result Date: 06/30/2023 CLINICAL DATA:  Provided history: Headache, increasing frequency or severity. EXAM: CT HEAD WITHOUT CONTRAST TECHNIQUE: Contiguous axial images were obtained from the base of the skull through the vertex without intravenous contrast. RADIATION DOSE REDUCTION: This exam was performed according to the departmental dose-optimization program which includes automated exposure control, adjustment of the mA and/or kV according to patient size and/or use of  iterative reconstruction technique. COMPARISON:  Prior head CT examinations 05/05/2023 and earlier. FINDINGS: Brain: Mild cerebral atrophy. There is no acute intracranial hemorrhage. No demarcated cortical infarct. No extra-axial fluid collection. No evidence of an intracranial mass. No midline shift. Vascular: No hyperdense vessel.  Atherosclerotic calcifications. Skull: No calvarial fracture or aggressive osseous lesion. Sinuses/Orbits: No mass or acute finding within the imaged orbits. No significant paranasal sinus disease at the imaged levels. IMPRESSION: 1.  No evidence of an acute intracranial abnormality. 2. Mild cerebral atrophy. Electronically Signed   By: Jackey Loge D.O.   On: 06/30/2023 15:18     Labs: CBC: Recent Labs  Lab 06/30/23 1300 07/01/23 0443 07/02/23 0518 07/04/23 0533  WBC 5.6 2.3* 2.3* 4.2  NEUTROABS 2.6  --   --   --   HGB 10.1* 9.0* 9.3* 9.8*  HCT 30.0* 26.6* 28.7* 30.7*  MCV 89.8 88.1 92.3 92.2  PLT 112* 73* 71* 101*   Basic Metabolic Panel: Recent Labs  Lab 06/30/23 1300 06/30/23 1630 07/01/23 0443 07/01/23 1355 07/02/23 0518 07/04/23 0533  NA 136  --  139 135 137 139  K 2.5*  --  3.3* 4.0 4.0 4.0  CL 106  --  112* 111 111 112*  CO2 20*  --  21* 19* 21* 21*  GLUCOSE 110*  --  95 103* 79 102*  BUN <5*  --  <5* <5* <5* 6  CREATININE 0.52*  --  0.44* 0.53* 0.57* 0.63  CALCIUM 8.1*  --  7.7* 8.2* 8.2* 8.6*  MG 1.7  --  1.9  --  1.8  --   PHOS  --  3.8  --   --  3.2  --    Liver Function Tests: Recent Labs  Lab 06/30/23 1300 07/01/23 0443 07/02/23 0518 07/04/23 0533  AST 99* 90* 75* 62*  ALT 26 22 20 19   ALKPHOS 289* 250* 248* 266*  BILITOT 3.4* 3.0* 4.3* 2.7*  PROT 6.8 6.1* 6.1* 6.5  ALBUMIN 2.9* 2.6* 2.5* 2.7*     Discharge time spent: greater than 30 minutes. Patient high risk to come back to the hospital especially if he goes back to drinking.  Signed: Alford Highland, MD [facility] 07/05/2023

## 2023-07-05 NOTE — Progress Notes (Signed)
MD order received in [facility] to discharge pt home today; verbally reviewed AVS with pt, no questions voiced at this time; TOC previously arranged a Taxi with Hiram Gash to pick the pt up at 1010 at the [facility] entrance; Taxi voucher given to the pt; pt discharged via wheelchair by nursing to the [facility] entrance

## 2023-07-05 NOTE — Plan of Care (Signed)

## 2023-07-05 NOTE — TOC Transition Note (Signed)
Transition of Care [facility]) - CM/SW Discharge Note   Patient Details  Name: Ethan Wells MRN: [id number] Date of Birth: [birth year]  Transition [facility]) CM/SW Contact:  Hetty Ely, RN Phone Number: 07/05/2023, 11:14 AM   Clinical Narrative:  CM completed Taxi form per patient's request for discharge to [facility].    Final next level of care: Home/Self Care Barriers to Discharge: Barriers Resolved   Patient Goals and CMS Choice      Discharge Placement                         Discharge Plan and Services Additional resources added to the After Visit Summary for                  DME Arranged: N/A DME Agency: NA                  Social Determinants of Health (SDOH) Interventions SDOH Screenings   Food Insecurity: No Food Insecurity (06/30/2023)  Housing: Patient Unable To Answer (06/30/2023)  Transportation Needs: No Transportation Needs (06/30/2023)  Utilities: Not At Risk (06/30/2023)  Alcohol Screen: Medium Risk (10/01/2022)  Tobacco Use: High Risk (06/30/2023)     Readmission Risk Interventions    02/24/2023    3:12 PM 01/17/2023    3:38 PM 10/22/2022   10:47 AM  Readmission Risk Prevention Plan  Transportation Screening Complete Complete Complete  PCP or Specialist Appt within 3-5 Days   Complete  Social Work Consult for Recovery Care Planning/Counseling   Complete  Palliative Care Screening   Not Applicable  Medication Review [employer]) Complete Complete Complete  SW Recovery Care/Counseling Consult  Not Complete   SW Consult Not Complete Comments  lethargic   Palliative Care Screening Not Applicable Not Applicable   Skilled Nursing Facility Not Applicable Not Applicable

## 2023-07-05 NOTE — Assessment & Plan Note (Signed)
-   On Seroquel 

## 2023-07-05 NOTE — Progress Notes (Signed)
Pt is discharging to a motel room/per pt via taxi cab. Charge nurse provided education and discharge instructions.

## 2023-07-05 NOTE — Assessment & Plan Note (Signed)
-  Nicotine patch 

## 2023-07-06 ENCOUNTER — Other Ambulatory Visit: Payer: Self-pay | Admitting: Adult Health

## 2023-07-07 ENCOUNTER — Telehealth: Payer: Self-pay

## 2023-07-07 NOTE — Transitions of Care (Post Inpatient/ED Visit) (Signed)
   07/07/2023  Name: Ethan Wells MRN: [id number] DOB: [birth year]  Today's TOC FU Call Status: Today's TOC FU Call Status:: Unsuccessul Call (1st Attempt) Unsuccessful Call (1st Attempt) Date: 07/07/23  Attempted to reach the patient regarding the most recent Inpatient/ED visit.  Follow Up Plan: Additional outreach attempts will be made to reach the patient to complete the Transitions of Care (Post Inpatient/ED visit) call.   Jodelle Gross, RN, BSN, CCM Care Management Coordinator [facility]

## 2023-07-08 ENCOUNTER — Telehealth: Payer: Self-pay
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
## 2023-07-28 ENCOUNTER — Other Ambulatory Visit: Payer: Self-pay | Admitting: Adult Health

## 2023-07-28 ENCOUNTER — Other Ambulatory Visit: Payer: Self-pay | Admitting: Orthopedic Surgery

## 2023-07-28 DIAGNOSIS — E782 Mixed hyperlipidemia: Secondary | ICD-10-CM

## 2023-08-08 ENCOUNTER — Inpatient Hospital Stay: Payer: Medicare HMO

## 2023-08-08 ENCOUNTER — Emergency Department: Payer: Medicare HMO

## 2023-08-08 ENCOUNTER — Other Ambulatory Visit: Payer: Self-pay

## 2023-08-08 ENCOUNTER — Inpatient Hospital Stay
Admission: EM | Admit: 2023-08-08 | Discharge: 2023-08-25 | DRG: 377 | Disposition: A | Discharge status: Home / Self Care | Payer: Medicare HMO | Attending: Internal Medicine | Admitting: Internal Medicine

## 2023-08-08 DIAGNOSIS — E872 Acidosis, unspecified: Secondary | ICD-10-CM | POA: Diagnosis present

## 2023-08-08 DIAGNOSIS — Z8674 Personal history of sudden cardiac arrest: Secondary | ICD-10-CM

## 2023-08-08 DIAGNOSIS — K649 Unspecified hemorrhoids: Secondary | ICD-10-CM | POA: Diagnosis present

## 2023-08-08 DIAGNOSIS — Z1152 Encounter for screening for COVID-19: Secondary | ICD-10-CM | POA: Diagnosis not present

## 2023-08-08 DIAGNOSIS — K7682 Hepatic encephalopathy: Secondary | ICD-10-CM | POA: Diagnosis not present

## 2023-08-08 DIAGNOSIS — R0600 Dyspnea, unspecified: Secondary | ICD-10-CM | POA: Diagnosis not present

## 2023-08-08 DIAGNOSIS — K2289 Other specified disease of esophagus: Secondary | ICD-10-CM | POA: Diagnosis present

## 2023-08-08 DIAGNOSIS — K2270 Barrett's esophagus without dysplasia: Secondary | ICD-10-CM | POA: Diagnosis not present

## 2023-08-08 DIAGNOSIS — K769 Liver disease, unspecified: Secondary | ICD-10-CM | POA: Diagnosis present

## 2023-08-08 DIAGNOSIS — D696 Thrombocytopenia, unspecified: Secondary | ICD-10-CM | POA: Diagnosis present

## 2023-08-08 DIAGNOSIS — R079 Chest pain, unspecified: Secondary | ICD-10-CM | POA: Diagnosis not present

## 2023-08-08 DIAGNOSIS — G9341 Metabolic encephalopathy: Secondary | ICD-10-CM | POA: Diagnosis present

## 2023-08-08 DIAGNOSIS — F418 Other specified anxiety disorders: Secondary | ICD-10-CM | POA: Diagnosis present

## 2023-08-08 DIAGNOSIS — K7460 Unspecified cirrhosis of liver: Secondary | ICD-10-CM | POA: Diagnosis not present

## 2023-08-08 DIAGNOSIS — I2510 Atherosclerotic heart disease of native coronary artery without angina pectoris: Secondary | ICD-10-CM | POA: Diagnosis present

## 2023-08-08 DIAGNOSIS — Z7902 Long term (current) use of antithrombotics/antiplatelets: Secondary | ICD-10-CM

## 2023-08-08 DIAGNOSIS — I959 Hypotension, unspecified: Secondary | ICD-10-CM

## 2023-08-08 DIAGNOSIS — E669 Obesity, unspecified: Secondary | ICD-10-CM | POA: Diagnosis present

## 2023-08-08 DIAGNOSIS — I5042 Chronic combined systolic (congestive) and diastolic (congestive) heart failure: Secondary | ICD-10-CM | POA: Diagnosis not present

## 2023-08-08 DIAGNOSIS — E871 Hypo-osmolality and hyponatremia: Secondary | ICD-10-CM | POA: Diagnosis present

## 2023-08-08 DIAGNOSIS — E2740 Unspecified adrenocortical insufficiency: Secondary | ICD-10-CM | POA: Diagnosis not present

## 2023-08-08 DIAGNOSIS — I517 Cardiomegaly: Secondary | ICD-10-CM | POA: Diagnosis not present

## 2023-08-08 DIAGNOSIS — F411 Generalized anxiety disorder: Secondary | ICD-10-CM | POA: Diagnosis present

## 2023-08-08 DIAGNOSIS — K3184 Gastroparesis: Secondary | ICD-10-CM | POA: Diagnosis not present

## 2023-08-08 DIAGNOSIS — I110 Hypertensive heart disease with heart failure: Secondary | ICD-10-CM | POA: Diagnosis present

## 2023-08-08 DIAGNOSIS — E876 Hypokalemia: Secondary | ICD-10-CM | POA: Diagnosis present

## 2023-08-08 DIAGNOSIS — I25119 Atherosclerotic heart disease of native coronary artery with unspecified angina pectoris: Secondary | ICD-10-CM | POA: Diagnosis present

## 2023-08-08 DIAGNOSIS — E271 Primary adrenocortical insufficiency: Secondary | ICD-10-CM | POA: Diagnosis present

## 2023-08-08 DIAGNOSIS — K7469 Other cirrhosis of liver: Secondary | ICD-10-CM | POA: Diagnosis not present

## 2023-08-08 DIAGNOSIS — F10231 Alcohol dependence with withdrawal delirium: Secondary | ICD-10-CM | POA: Diagnosis not present

## 2023-08-08 DIAGNOSIS — Z880 Allergy status to penicillin: Secondary | ICD-10-CM

## 2023-08-08 DIAGNOSIS — K219 Gastro-esophageal reflux disease without esophagitis: Secondary | ICD-10-CM | POA: Diagnosis present

## 2023-08-08 DIAGNOSIS — K264 Chronic or unspecified duodenal ulcer with hemorrhage: Principal | ICD-10-CM | POA: Diagnosis present

## 2023-08-08 DIAGNOSIS — R5381 Other malaise: Secondary | ICD-10-CM | POA: Diagnosis present

## 2023-08-08 DIAGNOSIS — Z811 Family history of alcohol abuse and dependence: Secondary | ICD-10-CM

## 2023-08-08 DIAGNOSIS — D649 Anemia, unspecified: Secondary | ICD-10-CM | POA: Diagnosis not present

## 2023-08-08 DIAGNOSIS — E8809 Other disorders of plasma-protein metabolism, not elsewhere classified: Secondary | ICD-10-CM | POA: Diagnosis present

## 2023-08-08 DIAGNOSIS — F32A Depression, unspecified: Secondary | ICD-10-CM | POA: Diagnosis present

## 2023-08-08 DIAGNOSIS — I5032 Chronic diastolic (congestive) heart failure: Secondary | ICD-10-CM | POA: Diagnosis present

## 2023-08-08 DIAGNOSIS — I8510 Secondary esophageal varices without bleeding: Secondary | ICD-10-CM | POA: Diagnosis present

## 2023-08-08 DIAGNOSIS — Z955 Presence of coronary angioplasty implant and graft: Secondary | ICD-10-CM

## 2023-08-08 DIAGNOSIS — I255 Ischemic cardiomyopathy: Secondary | ICD-10-CM | POA: Diagnosis not present

## 2023-08-08 DIAGNOSIS — K922 Gastrointestinal hemorrhage, unspecified: Principal | ICD-10-CM

## 2023-08-08 DIAGNOSIS — K279 Peptic ulcer, site unspecified, unspecified as acute or chronic, without hemorrhage or perforation: Secondary | ICD-10-CM | POA: Diagnosis not present

## 2023-08-08 DIAGNOSIS — D62 Acute posthemorrhagic anemia: Secondary | ICD-10-CM | POA: Diagnosis present

## 2023-08-08 DIAGNOSIS — Z8249 Family history of ischemic heart disease and other diseases of the circulatory system: Secondary | ICD-10-CM

## 2023-08-08 DIAGNOSIS — F17210 Nicotine dependence, cigarettes, uncomplicated: Secondary | ICD-10-CM | POA: Diagnosis present

## 2023-08-08 DIAGNOSIS — K2100 Gastro-esophageal reflux disease with esophagitis, without bleeding: Secondary | ICD-10-CM | POA: Diagnosis not present

## 2023-08-08 DIAGNOSIS — F1020 Alcohol dependence, uncomplicated: Secondary | ICD-10-CM | POA: Diagnosis present

## 2023-08-08 DIAGNOSIS — R601 Generalized edema: Secondary | ICD-10-CM | POA: Diagnosis present

## 2023-08-08 DIAGNOSIS — K766 Portal hypertension: Secondary | ICD-10-CM | POA: Diagnosis not present

## 2023-08-08 DIAGNOSIS — F10239 Alcohol dependence with withdrawal, unspecified: Secondary | ICD-10-CM | POA: Diagnosis present

## 2023-08-08 DIAGNOSIS — Z79899 Other long term (current) drug therapy: Secondary | ICD-10-CM

## 2023-08-08 DIAGNOSIS — K7030 Alcoholic cirrhosis of liver without ascites: Secondary | ICD-10-CM | POA: Diagnosis not present

## 2023-08-08 DIAGNOSIS — K551 Chronic vascular disorders of intestine: Secondary | ICD-10-CM | POA: Diagnosis not present

## 2023-08-08 DIAGNOSIS — F1410 Cocaine abuse, uncomplicated: Secondary | ICD-10-CM | POA: Diagnosis present

## 2023-08-08 DIAGNOSIS — G629 Polyneuropathy, unspecified: Secondary | ICD-10-CM | POA: Diagnosis present

## 2023-08-08 DIAGNOSIS — E861 Hypovolemia: Secondary | ICD-10-CM | POA: Diagnosis present

## 2023-08-08 DIAGNOSIS — K3189 Other diseases of stomach and duodenum: Secondary | ICD-10-CM | POA: Diagnosis present

## 2023-08-08 DIAGNOSIS — R578 Other shock: Secondary | ICD-10-CM | POA: Diagnosis not present

## 2023-08-08 DIAGNOSIS — Z452 Encounter for adjustment and management of vascular access device: Secondary | ICD-10-CM | POA: Diagnosis not present

## 2023-08-08 DIAGNOSIS — I252 Old myocardial infarction: Secondary | ICD-10-CM

## 2023-08-08 DIAGNOSIS — K820 Obstruction of gallbladder: Secondary | ICD-10-CM | POA: Diagnosis not present

## 2023-08-08 DIAGNOSIS — G894 Chronic pain syndrome: Secondary | ICD-10-CM | POA: Diagnosis present

## 2023-08-08 DIAGNOSIS — I5033 Acute on chronic diastolic (congestive) heart failure: Secondary | ICD-10-CM | POA: Diagnosis not present

## 2023-08-08 DIAGNOSIS — R0789 Other chest pain: Secondary | ICD-10-CM | POA: Diagnosis not present

## 2023-08-08 DIAGNOSIS — E785 Hyperlipidemia, unspecified: Secondary | ICD-10-CM | POA: Diagnosis present

## 2023-08-08 DIAGNOSIS — R948 Abnormal results of function studies of other organs and systems: Secondary | ICD-10-CM | POA: Diagnosis present

## 2023-08-08 DIAGNOSIS — R000 Tachycardia, unspecified: Secondary | ICD-10-CM | POA: Diagnosis present

## 2023-08-08 DIAGNOSIS — K625 Hemorrhage of anus and rectum: Secondary | ICD-10-CM | POA: Diagnosis not present

## 2023-08-08 DIAGNOSIS — E272 Addisonian crisis: Secondary | ICD-10-CM | POA: Diagnosis present

## 2023-08-08 DIAGNOSIS — M7989 Other specified soft tissue disorders: Secondary | ICD-10-CM | POA: Diagnosis not present

## 2023-08-08 DIAGNOSIS — Z6834 Body mass index (BMI) 34.0-34.9, adult: Secondary | ICD-10-CM

## 2023-08-08 DIAGNOSIS — I700 Atherosclerosis of aorta: Secondary | ICD-10-CM | POA: Diagnosis not present

## 2023-08-08 DIAGNOSIS — F1429 Cocaine dependence with unspecified cocaine-induced disorder: Secondary | ICD-10-CM | POA: Diagnosis present

## 2023-08-08 DIAGNOSIS — F17200 Nicotine dependence, unspecified, uncomplicated: Secondary | ICD-10-CM | POA: Diagnosis not present

## 2023-08-08 DIAGNOSIS — R109 Unspecified abdominal pain: Secondary | ICD-10-CM | POA: Diagnosis not present

## 2023-08-08 DIAGNOSIS — I25118 Atherosclerotic heart disease of native coronary artery with other forms of angina pectoris: Secondary | ICD-10-CM | POA: Diagnosis not present

## 2023-08-08 DIAGNOSIS — K269 Duodenal ulcer, unspecified as acute or chronic, without hemorrhage or perforation: Secondary | ICD-10-CM | POA: Diagnosis not present

## 2023-08-08 LAB — HEPATIC FUNCTION PANEL
ALT: 20 U/L (ref 0–44)
AST: 59 U/L — ABNORMAL HIGH (ref 15–41)
Albumin: 3.1 g/dL — ABNORMAL LOW (ref 3.5–5.0)
Alkaline Phosphatase: 219 U/L — ABNORMAL HIGH (ref 38–126)
Bilirubin, Direct: 1.9 mg/dL — ABNORMAL HIGH (ref 0.0–0.2)
Indirect Bilirubin: 4 mg/dL — ABNORMAL HIGH (ref 0.3–0.9)
Total Bilirubin: 5.9 mg/dL — ABNORMAL HIGH (ref 0.3–1.2)
Total Protein: 7.4 g/dL (ref 6.5–8.1)

## 2023-08-08 LAB — URINE DRUG SCREEN, QUALITATIVE (ARMC ONLY)
Amphetamines, Ur Screen: NOT DETECTED
Barbiturates, Ur Screen: NOT DETECTED
Benzodiazepine, Ur Scrn: NOT DETECTED
Cannabinoid 50 Ng, Ur ~~LOC~~: NOT DETECTED
Cocaine Metabolite,Ur ~~LOC~~: POSITIVE — AB
MDMA (Ecstasy)Ur Screen: NOT DETECTED
Methadone Scn, Ur: NOT DETECTED
Opiate, Ur Screen: NOT DETECTED
Phencyclidine (PCP) Ur S: NOT DETECTED
Tricyclic, Ur Screen: NOT DETECTED

## 2023-08-08 LAB — TYPE AND SCREEN

## 2023-08-08 LAB — CBC
HCT: 31.5 % — ABNORMAL LOW (ref 39.0–52.0)
Hemoglobin: 10.4 g/dL — ABNORMAL LOW (ref 13.0–17.0)
MCH: 30.8 pg (ref 26.0–34.0)
MCHC: 33 g/dL (ref 30.0–36.0)
MCV: 93.2 fL (ref 80.0–100.0)
Platelets: 113 10*3/uL — ABNORMAL LOW (ref 150–400)
RBC: 3.38 MIL/uL — ABNORMAL LOW (ref 4.22–5.81)
RDW: 18.5 % — ABNORMAL HIGH (ref 11.5–15.5)
WBC: 4.6 10*3/uL (ref 4.0–10.5)
nRBC: 0 % (ref 0.0–0.2)

## 2023-08-08 LAB — BRAIN NATRIURETIC PEPTIDE: B Natriuretic Peptide: 217.7 pg/mL — ABNORMAL HIGH (ref 0.0–100.0)

## 2023-08-08 LAB — BASIC METABOLIC PANEL
Anion gap: 13 (ref 5–15)
BUN: 9 mg/dL (ref 6–20)
CO2: 22 mmol/L (ref 22–32)
Calcium: 8.4 mg/dL — ABNORMAL LOW (ref 8.9–10.3)
Chloride: 99 mmol/L (ref 98–111)
Creatinine, Ser: 0.64 mg/dL (ref 0.61–1.24)
GFR, Estimated: >60 mL/min (ref >60)
Glucose, Bld: 102 mg/dL — ABNORMAL HIGH (ref 70–99)
Potassium: 2.8 mmol/L — ABNORMAL LOW (ref 3.5–5.1)
Sodium: 134 mmol/L — ABNORMAL LOW (ref 135–145)

## 2023-08-08 LAB — SARS CORONAVIRUS 2 BY RT PCR: SARS Coronavirus 2 by RT PCR: NEGATIVE

## 2023-08-08 LAB — HEMOGLOBIN AND HEMATOCRIT, BLOOD
HCT: 24.2 % — ABNORMAL LOW (ref 39.0–52.0)
Hemoglobin: 7.9 g/dL — ABNORMAL LOW (ref 13.0–17.0)

## 2023-08-08 LAB — PROTIME-INR
INR: 1.7 — ABNORMAL HIGH (ref 0.8–1.2)
Prothrombin Time: 20.6 seconds — ABNORMAL HIGH (ref 11.4–15.2)

## 2023-08-08 LAB — TROPONIN I (HIGH SENSITIVITY)
Troponin I (High Sensitivity): 17 ng/L (ref <18)
Troponin I (High Sensitivity): 14 ng/L (ref <18)

## 2023-08-08 LAB — TSH: TSH: 0.516 u[IU]/mL (ref 0.350–4.500)

## 2023-08-08 LAB — T4, FREE: Free T4: 0.97 ng/dL (ref 0.61–1.12)

## 2023-08-08 LAB — APTT: aPTT: 41 seconds — ABNORMAL HIGH (ref 24–36)

## 2023-08-08 MED ORDER — DOCUSATE SODIUM 100 MG PO CAPS: 100.0000 mg | ORAL_CAPSULE | Freq: Two times a day (BID) | ORAL | Status: DC | PRN

## 2023-08-08 MED ORDER — HALOPERIDOL LACTATE 5 MG/ML IJ SOLN
5.0000 mg | Freq: Once | INTRAMUSCULAR | Status: AC
  Administered 2023-08-08: 5 mg via INTRAVENOUS
  Filled 2023-08-08: qty 1

## 2023-08-08 MED ORDER — PANTOPRAZOLE INFUSION (NEW) - SIMPLE MED
8.0000 mg/h | INTRAVENOUS | Status: DC
  Administered 2023-08-08 – 2023-08-09 (×2): 8 mg/h via INTRAVENOUS
  Filled 2023-08-08 (×2): qty 100

## 2023-08-08 MED ORDER — PANTOPRAZOLE 80MG IVPB - SIMPLE MED
80.0000 mg | Freq: Once | INTRAVENOUS | Status: AC
  Administered 2023-08-08: 80 mg via INTRAVENOUS
  Filled 2023-08-08: qty 100

## 2023-08-08 MED ORDER — IOHEXOL 350 MG/ML SOLN
100.0000 mL | Freq: Once | INTRAVENOUS | Status: AC | PRN
  Administered 2023-08-09: 100 mL via INTRAVENOUS

## 2023-08-08 MED ORDER — SODIUM CHLORIDE 0.9 % IV SOLN
10.0000 mL/h | Freq: Once | INTRAVENOUS | Status: AC
  Administered 2023-08-09: 10 mL/h via INTRAVENOUS

## 2023-08-08 MED ORDER — POLYETHYLENE GLYCOL 3350 17 G PO PACK: 17.0000 g | PACK | Freq: Every day | ORAL | Status: DC | PRN

## 2023-08-08 MED ORDER — SODIUM CHLORIDE 0.9 % IV BOLUS
1000.0000 mL | Freq: Once | INTRAVENOUS | Status: AC
  Administered 2023-08-08: 1000 mL via INTRAVENOUS

## 2023-08-08 MED ORDER — SODIUM CHLORIDE 0.9 % IV SOLN
1.0000 g | INTRAVENOUS | Status: AC
  Administered 2023-08-09 – 2023-08-15 (×8): 1 g via INTRAVENOUS
  Filled 2023-08-08 (×8): qty 10

## 2023-08-08 MED ORDER — PANTOPRAZOLE SODIUM 40 MG IV SOLR: 40.0000 mg | Freq: Two times a day (BID) | INTRAVENOUS | Status: DC

## 2023-08-08 MED ORDER — POTASSIUM CHLORIDE 20 MEQ PO PACK
40.0000 meq | PACK | Freq: Two times a day (BID) | ORAL | Status: DC
  Administered 2023-08-08: 40 meq via ORAL
  Filled 2023-08-08: qty 2

## 2023-08-08 MED ORDER — POTASSIUM CHLORIDE 20 MEQ PO PACK: 40.0000 meq | PACK | Freq: Two times a day (BID) | ORAL | Status: DC

## 2023-08-08 NOTE — Consult Note (Signed)
PHARMACY CONSULT NOTE - ELECTROLYTES  Pharmacy Consult for Electrolyte Monitoring and Replacement   Recent Labs:   CrCl cannot be calculated (Unknown ideal weight.). Potassium (mmol/L)  Date Value  08/08/2023 2.8 (L)  09/06/2013 3.5   Magnesium (mg/dL)  Date Value  83/15/1761 1.8  03/09/2013 2.2   Calcium (mg/dL)  Date Value  60/73/7106 8.4 (L)   Calcium, Total (mg/dL)  Date Value  26/94/8546 8.9   Albumin (g/dL)  Date Value  27/02/5008 3.1 (L)  09/06/2013 4.0   Phosphorus (mg/dL)  Date Value  38/18/2993 3.2   Sodium (mmol/L)  Date Value  08/08/2023 134 (L)  09/06/2013 134 (L)    Assessment  Ethan Wells is a 52 y.o. male presenting with abdominal pain. PMH significant for alcoholic liver cirrhosis with portal gastropathy, s/p of TIPS, superior mesenteric artery stenosis, HTN, HLD, CAD, STEMI, dCHF, GERD, depression with anxiety, adrenal insufficiency (Addison's disease), migraine headache, cardiac arrest due to V-fib, GI bleed,cocaine abuse, tobacco abuse. Pharmacy has been consulted to monitor and replace electrolytes.  Diet: NPO MIVF: NS @ 10 mL/hr  Goal of Therapy: Electrolytes WNL  Plan:  K 2.8 on admission./ Replaced by provider with Kcl packets po BID. No additional replacement indicated at this time. Check BMP, Mg, Phos with AM labs  Thank you for allowing pharmacy to be a part of this patient's care.  Marquasha Brutus Rodriguez-Guzman PharmD, BCPS 08/08/2023 11:07 PM

## 2023-08-08 NOTE — ED Provider Notes (Signed)
[facility] Provider Note    Event Date/Time   First MD Initiated Contact with Patient 08/08/23 1503     (approximate)   History   Abdominal pain   HPI  Ethan Wells is a 52 y.o. male  who presents to the emergency department with myriad medical complaints, with the first one he mentioned to me being abdominal pain. However also complaining of leg swelling, foot pain, back pain, chest pain, shortness of breath, decreased appetite. His symptoms started roughly 1 week ago.  In terms of the abdominal pain it is located primarily in the upper and lower abdomen.  He has found it hard to have good bowel movements.  He thinks he has seen some blood in his stool.  Additionally the patient states that he has had multiple family members who have been sick recently who is been taking care of.      Physical Exam   Triage Vital Signs: ED Triage Vitals  Encounter Vitals Group     BP 08/08/23 1433 (!) 107/53     Systolic BP Percentile --      Diastolic BP Percentile --      Pulse Rate 08/08/23 1433 80     Resp 08/08/23 1433 18     Temp 08/08/23 1433 98.2 F (36.8 C)     Temp src --      SpO2 08/08/23 1433 98 %     Weight --      Height --      Head Circumference --      Peak Flow --      Pain Score 08/08/23 1430 8     Pain Loc --      Pain Education --      Exclude from Growth Chart --     Most recent vital signs: Vitals:   08/08/23 1433  BP: (!) 107/53  Pulse: 80  Resp: 18  Temp: 98.2 F (36.8 C)  SpO2: 98%   General: Awake, alert, oriented. CV:  Good peripheral perfusion. Regular rate and rhythm. Resp:  Normal effort. Lungs clear. Abd:  No distention.  Other:  Bilateral lower extremity edema.    ED Results / Procedures / Treatments   Labs (all labs ordered are listed, but only abnormal results are displayed) Labs Reviewed  BASIC METABOLIC PANEL - Abnormal; Notable for the following components:      Result Value   Sodium 134 (*)     Potassium 2.8 (*)    Glucose, Bld 102 (*)    Calcium 8.4 (*)    All other components within normal limits  CBC - Abnormal; Notable for the following components:   RBC 3.38 (*)    Hemoglobin 10.4 (*)    HCT 31.5 (*)    RDW 18.5 (*)    Platelets 113 (*)    All other components within normal limits  BRAIN NATRIURETIC PEPTIDE - Abnormal; Notable for the following components:   B Natriuretic Peptide 217.7 (*)    All other components within normal limits  HEMOGLOBIN AND HEMATOCRIT, BLOOD - Abnormal; Notable for the following components:   Hemoglobin 7.9 (*)    HCT 24.2 (*)    All other components within normal limits  PROTIME-INR - Abnormal; Notable for the following components:   Prothrombin Time 20.6 (*)    INR 1.7 (*)    All other components within normal limits  APTT - Abnormal; Notable for the following components:   aPTT 41 (*)  All other components within normal limits  HEPATIC FUNCTION PANEL - Abnormal; Notable for the following components:   Albumin 3.1 (*)    AST 59 (*)    Alkaline Phosphatase 219 (*)    Total Bilirubin 5.9 (*)    Bilirubin, Direct 1.9 (*)    Indirect Bilirubin 4.0 (*)    All other components within normal limits  SARS CORONAVIRUS 2 BY RT PCR  TSH  T4, FREE  URINE DRUG SCREEN, QUALITATIVE ([facility] ONLY)  ETHANOL  LACTIC ACID, PLASMA  CBC  MAGNESIUM  PHOSPHORUS  TYPE AND SCREEN  TYPE AND SCREEN  PREPARE RBC (CROSSMATCH)  PREPARE RBC (CROSSMATCH)  TYPE AND SCREEN  TROPONIN I (HIGH SENSITIVITY)  TROPONIN I (HIGH SENSITIVITY)     EKG  I, Phineas Semen, attending physician, personally viewed and interpreted this EKG  EKG Time: 1431 Rate: 78 Rhythm: normal sinus rhythm Axis: normal Intervals: qtc 467 QRS: narrow, q waves v1 ST changes: no st elevation Impression: abnormal ekg   RADIOLOGY I independently interpreted and visualized the CXR. My interpretation: No pneumonia Radiology interpretation:  IMPRESSION:  1. No active  cardiopulmonary disease.  2. Mild cardiomegaly.      PROCEDURES:  Critical Care performed: Yes  CRITICAL CARE Performed by: Phineas Semen   Total critical care time: 40 minutes  Critical care time was exclusive of separately billable procedures and treating other patients.  Critical care was necessary to treat or prevent imminent or life-threatening deterioration.  Critical care was time spent personally by me on the following activities: development of treatment plan with patient and/or surrogate as well as nursing, discussions with consultants, evaluation of patient's response to treatment, examination of patient, obtaining history from patient or surrogate, ordering and performing treatments and interventions, ordering and review of laboratory studies, ordering and review of radiographic studies, pulse oximetry and re-evaluation of patient's condition.   Procedures    MEDICATIONS ORDERED IN ED: Medications - No data to display   IMPRESSION / MDM / ASSESSMENT AND PLAN / ED COURSE  I reviewed the triage vital signs and the nursing notes.                              Differential diagnosis includes, but is not limited to, ACS, pneumonia, gi bleed, anemia  Patient's presentation is most consistent with acute presentation with potential threat to life or bodily function.   The patient is on the cardiac monitor to evaluate for evidence of arrhythmia and/or significant heart rate changes.  Patient presented to the emergency department today with myriad medical complaints.  Broad workup was initiated.  Troponin was negative.  EKG without findings concerning for ST elevation.  Initial blood work shows slight anemia however this is improved over patient's recent blood work.  However while patient was here he did have a large bloody bowel movement.  Because of this repeat hemoglobin was checked and did show significant drop.  I did discuss in consent patient for blood transfusion.   However prior to the blood being available his blood pressure dropped.  Because of this he was given emergent blood.  Terms of his mental status he continued to be awake alert and oriented.  I discussed with Dr. Mia Creek with GI.  Recommended CT GI bleed as well as UDS.  Given significant hypotension and GI bleed we will plan on discussing with ICU team for admission.     FINAL CLINICAL IMPRESSION(S) / ED  DIAGNOSES   Final diagnoses:  Gastrointestinal hemorrhage, unspecified gastrointestinal hemorrhage type  Anemia, unspecified type  Hypotension, unspecified hypotension type     Note:  This document was prepared using Dragon voice recognition software and may include unintentional dictation errors.    Phineas Semen, MD 08/08/23 [phone]

## 2023-08-08 NOTE — Consult Note (Signed)
Consultation  Referring Provider:  ED  Admit date: 08/08/2023 Consult date: 08/08/2023         Reason for Consultation: Hematochezia              HPI:   Ethan Wells is a 52 y.o. gentleman with history of alcoholic cirrhosis s/p TIPS due to esophageal variceal bleeding, HFpEF, CAD, adrenal insufficiency, polysubstance abuse including cocaine, and celiac/SMA stenosis s/p stenting here with a number of complaints including lower extremity swelling, chest pain, abdominal pain, and multiple day history of blood in his stools with the largest episode occurring here in the ED with bright red/maroon in toilet hat. His last drink was Wednesday and he is unsure the last time he used cocaine. He took one dose of Goody[employer] powders a few days ago. He is taking his protonix but not taking his solucortef or plavix. He endorses constipation. History is somewhat limited. Has history of positive CTA in rectum, likely hemorrhoidal. Long history of recurrent rectal bleeding attributed to hemorrhoids.  Past Medical History:  Diagnosis Date   CAD (coronary artery disease)    a. 01/2011 Anterior STEMI/Cath/PCI: LM nl, LAD 100d (3.5x97mm Vision BMS placed), LCX 54m, RI 50, RCA min irregs, EF 40% w/ apical, inferoapical HK; b. 10/2022 NSTSEMI/Cath: LM nl, LAD 5p/m, RI 85, LCX 168m, OM1 mild dzs, OM3 100 fills via L->L collats from dLAD, RCA 80p, 133m, RPDA fills via collats from LAD. EF 45-50%-->Med Rx.   Cardiac arrest - ventricular fibrillation    a. In setting of STEMI 01/2011   Chronic pain    Cocaine abuse (HCC)    Depression with anxiety    ETOH abuse    a. 6-12 beers/day   GERD (gastroesophageal reflux disease)    GI bleed    Hemorrhoids    Hyperlipidemia    Hypertension    Ischemic cardiomyopathy    a. 06/2011 Echo: EF 45-50%, No rwma; b. 10/2022 Echo: EF 55-60%, no rwma, nl RV fxn.   Marijuana abuse    Migraines    Tobacco abuse    a. 1/2 ppd x 26 yrs    Past Surgical History:  Procedure  Laterality Date   COLONOSCOPY  08/09/2021   Procedure: COLONOSCOPY;  Surgeon: Midge Minium, MD;  Location: [facility] ENDOSCOPY;  Service: Endoscopy;;   CORONARY ANGIOPLASTY WITH STENT PLACEMENT     ESOPHAGEAL BANDING  04/08/2023   Procedure: ESOPHAGEAL BANDING;  Surgeon: Tressia Danas, MD;  Location: [facility] ENDOSCOPY;  Service: Gastroenterology;;   ESOPHAGOGASTRODUODENOSCOPY N/A 05/30/2022   Procedure: ESOPHAGOGASTRODUODENOSCOPY (EGD);  Surgeon: Toney Reil, MD;  Location: [facility] ENDOSCOPY;  Service: Gastroenterology;  Laterality: N/A;   ESOPHAGOGASTRODUODENOSCOPY (EGD) WITH PROPOFOL N/A 03/01/2021   Procedure: ESOPHAGOGASTRODUODENOSCOPY (EGD) WITH PROPOFOL;  Surgeon: Toney Reil, MD;  Location: [facility];  Service: Endoscopy;  Laterality: N/A;   ESOPHAGOGASTRODUODENOSCOPY (EGD) WITH PROPOFOL N/A 08/09/2021   Procedure: ESOPHAGOGASTRODUODENOSCOPY (EGD) WITH PROPOFOL;  Surgeon: Midge Minium, MD;  Location: [facility] ENDOSCOPY;  Service: Endoscopy;  Laterality: N/A;   ESOPHAGOGASTRODUODENOSCOPY (EGD) WITH PROPOFOL N/A 03/01/2023   Procedure: ESOPHAGOGASTRODUODENOSCOPY (EGD) WITH PROPOFOL;  Surgeon: Jaynie Collins, DO;  Location: [facility] ENDOSCOPY;  Service: Gastroenterology;  Laterality: N/A;   ESOPHAGOGASTRODUODENOSCOPY (EGD) WITH PROPOFOL N/A 04/08/2023   Procedure: ESOPHAGOGASTRODUODENOSCOPY (EGD) WITH PROPOFOL;  Surgeon: Tressia Danas, MD;  Location: [facility] ENDOSCOPY;  Service: Gastroenterology;  Laterality: N/A;   EVALUATION UNDER ANESTHESIA WITH HEMORRHOIDECTOMY N/A 10/31/2022   Procedure: EXAM UNDER ANESTHESIA WITH SUTURE LIGATION OF TWO BLEEDING PEDICLES;  Surgeon:  Henrene Dodge, MD;  Location: [facility] ORS;  Service: General;  Laterality: N/A;   FLEXIBLE SIGMOIDOSCOPY N/A 10/02/2022   Procedure: FLEXIBLE SIGMOIDOSCOPY;  Surgeon: Meryl Dare, MD;  Location: [facility] ENDOSCOPY;  Service: Gastroenterology;  Laterality: N/A;   IR TIPS  04/11/2023   IR US GUIDE VASC ACCESS RIGHT   04/11/2023   IR US GUIDE VASC ACCESS RIGHT  04/11/2023   IR US GUIDE VASC ACCESS RIGHT  04/11/2023   LEFT HEART CATH AND CORONARY ANGIOGRAPHY N/A 11/04/2022   Procedure: LEFT HEART CATH AND CORONARY ANGIOGRAPHY;  Surgeon: Iran Ouch, MD;  Location: [facility] INVASIVE CV LAB;  Service: Cardiovascular;  Laterality: N/A;   RADIOLOGY WITH ANESTHESIA N/A 04/11/2023   Procedure: IR WITH ANESTHESIA;  Surgeon: Oley Balm, MD;  Location: [facility] OR;  Service: Radiology;  Laterality: N/A;   VISCERAL ANGIOGRAPHY N/A 10/24/2022   Procedure: VISCERAL ANGIOGRAPHY;  Surgeon: Annice Needy, MD;  Location: [facility] INVASIVE CV LAB;  Service: Cardiovascular;  Laterality: N/A;   VISCERAL ARTERY INTERVENTION N/A 05/09/2023   Procedure: VISCERAL ARTERY INTERVENTION;  Surgeon: Renford Dills, MD;  Location: [facility] INVASIVE CV LAB;  Service: Cardiovascular;  Laterality: N/A;    Family History  Problem Relation Age of Onset   Coronary artery disease Mother        alive   Other Other        2 sisters alive and well   Alcohol abuse Father        died @ 61   Other Paternal Grandmother        PACER    Social History   Tobacco Use   Smoking status: Every Day    Current packs/day: 0.50    Average packs/day: 0.5 packs/day for 41.0 years (20.5 ttl pk-yrs)    Types: Cigarettes   Smokeless tobacco: Never   Tobacco comments:    started age 89  Vaping Use   Vaping status: Never Used  Substance Use Topics   Alcohol use: Yes    Alcohol/week: 12.0 standard drinks of alcohol    Types: 12 Cans of beer per week    Comment: at least a 6 pack of beer daily and often a 12 pack   Drug use: Yes    Types: Cocaine, Marijuana    Prior to Admission medications   Medication Sig Start Date End Date Taking? Authorizing Provider  gabapentin (NEURONTIN) 100 MG capsule Take 1 capsule (100 mg total) by mouth 2 (two) times daily. 07/05/23 08/08/23 Yes Wieting, Richard, MD  methocarbamol (ROBAXIN) 750 MG tablet TAKE 1 TABLET (750 MG  TOTAL) BY MOUTH 2 (TWO) TIMES DAILY AS NEEDED FOR MUSCLE SPASMS. 07/28/23  Yes Medina-Vargas, Monina C, NP  oxyCODONE (OXY IR/ROXICODONE) 5 MG immediate release tablet Take 1 tablet (5 mg total) by mouth every 6 (six) hours as needed for moderate pain. 07/05/23  Yes Wieting, Richard, MD  pantoprazole (PROTONIX) 40 MG tablet Take 1 tablet (40 mg total) by mouth 2 (two) times daily. 07/05/23 08/08/23 Yes Wieting, Richard, MD  QUEtiapine (SEROQUEL) 50 MG tablet Take 1 tablet (50 mg total) by mouth at bedtime. 07/05/23 08/08/23 Yes Wieting, Richard, MD  rosuvastatin (CRESTOR) 40 MG tablet TAKE 1 TABLET BY MOUTH EVERY DAY 07/28/23  Yes Medina-Vargas, Monina C, NP  clopidogrel (PLAVIX) 75 MG tablet Take 1 tablet (75 mg total) by mouth daily. Patient not taking: Reported on 07/08/2023 07/05/23   Alford Highland, MD  folic acid (FOLVITE) 1 MG tablet Take 1 tablet (1 mg  total) by mouth daily. Patient not taking: Reported on 07/08/2023 07/05/23   Alford Highland, MD  LORazepam (ATIVAN) 0.5 MG tablet One tab po three times a day for two days then one tab twice a day for two days then one tab po daily for three days Patient not taking: Reported on 07/08/2023 07/05/23   Alford Highland, MD  Multiple Vitamin (MULTIVITAMIN WITH MINERALS) TABS tablet Take 1 tablet by mouth daily. Patient not taking: Reported on 07/08/2023 07/05/23   Alford Highland, MD  nicotine (NICODERM CQ - DOSED IN MG/24 HOURS) 21 mg/24hr patch One 21 mg patch chest wall daily (okay to substitute generic) Patient not taking: Reported on 07/08/2023 07/05/23   Alford Highland, MD  nitroGLYCERIN (NITROSTAT) 0.4 MG SL tablet Place 1 tablet (0.4 mg total) under the tongue every 5 (five) minutes as needed for chest pain. Patient not taking: Reported on 07/08/2023 07/05/23   Alford Highland, MD  ondansetron (ZOFRAN) 4 MG tablet Take 1 tablet (4 mg total) by mouth every 8 (eight) hours as needed for nausea or vomiting. Patient not taking: Reported on 07/08/2023  07/05/23   Alford Highland, MD  rifaximin (XIFAXAN) 550 MG TABS tablet Take 1 tablet (550 mg total) by mouth 2 (two) times daily. Patient not taking: Reported on 07/08/2023 07/05/23   Alford Highland, MD  thiamine (VITAMIN B1) 100 MG tablet Take 1 tablet (100 mg total) by mouth daily. Patient not taking: Reported on 07/08/2023 07/05/23   Alford Highland, MD    Current Facility-Administered Medications  Medication Dose Route Frequency Provider Last Rate Last Admin   0.9 %  sodium chloride infusion  10 mL/hr Intravenous Once Phineas Semen, MD       docusate sodium (COLACE) capsule 100 mg  100 mg Oral BID PRN Jimmye Norman, NP       Melene Muller ON 08/12/2023] pantoprazole (PROTONIX) injection 40 mg  40 mg Intravenous Q12H Phineas Semen, MD       pantoprozole (PROTONIX) 80 mg /NS 100 mL infusion  8 mg/hr Intravenous Continuous Phineas Semen, MD 10 mL/hr at 08/08/23 2040 8 mg/hr at 08/08/23 2040   polyethylene glycol (MIRALAX / GLYCOLAX) packet 17 g  17 g Oral Daily PRN Jimmye Norman, NP       potassium chloride (KLOR-CON) packet 40 mEq  40 mEq Oral BID Angelique Blonder, RPH   40 mEq at 08/08/23 1655   sodium chloride 0.9 % bolus 1,000 mL  1,000 mL Intravenous Once Phineas Semen, MD       Current Outpatient Medications  Medication Sig Dispense Refill   gabapentin (NEURONTIN) 100 MG capsule Take 1 capsule (100 mg total) by mouth 2 (two) times daily. 60 capsule 0   methocarbamol (ROBAXIN) 750 MG tablet TAKE 1 TABLET (750 MG TOTAL) BY MOUTH 2 (TWO) TIMES DAILY AS NEEDED FOR MUSCLE SPASMS. 60 tablet 0   oxyCODONE (OXY IR/ROXICODONE) 5 MG immediate release tablet Take 1 tablet (5 mg total) by mouth every 6 (six) hours as needed for moderate pain. 12 tablet 0   pantoprazole (PROTONIX) 40 MG tablet Take 1 tablet (40 mg total) by mouth 2 (two) times daily. 60 tablet 0   QUEtiapine (SEROQUEL) 50 MG tablet Take 1 tablet (50 mg total) by mouth at bedtime. 30 tablet 0   rosuvastatin  (CRESTOR) 40 MG tablet TAKE 1 TABLET BY MOUTH EVERY DAY 90 tablet 1   clopidogrel (PLAVIX) 75 MG tablet Take 1 tablet (75 mg total) by mouth daily. (Patient not taking:  Reported on 07/08/2023) 30 tablet 0   folic acid (FOLVITE) 1 MG tablet Take 1 tablet (1 mg total) by mouth daily. (Patient not taking: Reported on 07/08/2023) 30 tablet 0   LORazepam (ATIVAN) 0.5 MG tablet One tab po three times a day for two days then one tab twice a day for two days then one tab po daily for three days (Patient not taking: Reported on 07/08/2023) 13 tablet 0   Multiple Vitamin (MULTIVITAMIN WITH MINERALS) TABS tablet Take 1 tablet by mouth daily. (Patient not taking: Reported on 07/08/2023)     nicotine (NICODERM CQ - DOSED IN MG/24 HOURS) 21 mg/24hr patch One 21 mg patch chest wall daily (okay to substitute generic) (Patient not taking: Reported on 07/08/2023) 28 patch 0   nitroGLYCERIN (NITROSTAT) 0.4 MG SL tablet Place 1 tablet (0.4 mg total) under the tongue every 5 (five) minutes as needed for chest pain. (Patient not taking: Reported on 07/08/2023) 30 tablet 0   ondansetron (ZOFRAN) 4 MG tablet Take 1 tablet (4 mg total) by mouth every 8 (eight) hours as needed for nausea or vomiting. (Patient not taking: Reported on 07/08/2023) 20 tablet 0   rifaximin (XIFAXAN) 550 MG TABS tablet Take 1 tablet (550 mg total) by mouth 2 (two) times daily. (Patient not taking: Reported on 07/08/2023) 60 tablet 0   thiamine (VITAMIN B1) 100 MG tablet Take 1 tablet (100 mg total) by mouth daily. (Patient not taking: Reported on 07/08/2023) 30 tablet 0    Allergies as of 08/08/2023 - Review Complete 08/08/2023  Allergen Reaction Noted   Penicillins Hives and Rash 02/26/2011     Review of Systems:    All systems reviewed and negative except where noted in HPI.  Review of Systems  Constitutional:  Positive for malaise/fatigue. Negative for chills and fever.  Respiratory:  Positive for shortness of breath.   Cardiovascular:  Positive  for chest pain and leg swelling.  Gastrointestinal:  Positive for abdominal pain, blood in stool and constipation. Negative for melena, nausea and vomiting.  Genitourinary:  Negative for dysuria.  Musculoskeletal:  Positive for joint pain.  Neurological:  Positive for tingling and headaches. Negative for focal weakness.  Psychiatric/Behavioral:  Positive for substance abuse.   All other systems reviewed and are negative.      Physical Exam:  Vital signs in last 24 hours: Temp:  [97.8 F (36.6 C)-98.4 F (36.9 C)] 98.2 F (36.8 C) (08/23 2150) Pulse Rate:  [77-114] 83 (08/23 2230) Resp:  [13-31] 19 (08/23 2230) BP: (80-116)/(44-87) 116/56 (08/23 2230) SpO2:  [98 %-100 %] 100 % (08/23 2230)   General:   Chronically ill appearing gentleman Head:  Normocephalic and atraumatic. Eyes:   Mild scleral icterus Mouth: Dry mucous membranes Neck:  Supple; no masses felt Lungs:  No respiratory distress Heart:  RRR Abdomen:   Flat, soft, nondistended, nontender Rectal:  Not performed as red/maroon stool seen in toilet hat Msk: Trace edema Neurologic:  Alert and  oriented x4;  No focal deficits Skin:  slightly jaundiced appearing Psych:  Alert and cooperative. Flat affect  LAB RESULTS: Recent Labs    08/08/23 1433 08/08/23 1917  WBC 4.6  --   HGB 10.4* 7.9*  HCT 31.5* 24.2*  PLT 113*  --    BMET Recent Labs    08/08/23 1433  NA 134*  K 2.8*  CL 99  CO2 22  GLUCOSE 102*  BUN 9  CREATININE 0.64  CALCIUM 8.4*   LFT Recent Labs  08/08/23 1433  PROT 7.4  ALBUMIN 3.1*  AST 59*  ALT 20  ALKPHOS 219*  BILITOT 5.9*  BILIDIR 1.9*  IBILI 4.0*   PT/INR Recent Labs    08/08/23 1952  LABPROT 20.6*  INR 1.7*    STUDIES: DG Chest 2 View  Result Date: 08/08/2023 CLINICAL DATA:  Left leg swelling for 1 week. EXAM: CHEST - 2 VIEW COMPARISON:  Chest radiographs 05/30/2023 and 05/09/2023 FINDINGS: Cardiac silhouette is again mildly enlarged. Mediastinal contours are  within normal limits. The lungs are clear. No pleural effusion pneumothorax. Mild multilevel degenerative disc changes of the thoracic spine. Superior right upper quadrant surgical clip is again seen along with faint tips, better seen on prior 05/09/2023 CT. IMPRESSION: 1. No active cardiopulmonary disease. 2. Mild cardiomegaly. Electronically Signed   By: Neita Garnet M.D.   On: 08/08/2023 17:03       Impression / Plan:   52 y.o. gentleman with history of alcoholic cirrhosis s/p TIPS due to esophageal variceal bleeding, HFpEF, CAD, adrenal insufficiency, polysubstance abuse including cocaine, and celiac/SMA stenosis s/p stenting here with a number of complaints including lower extremity swelling, chest pain, abdominal pain, and multiple day history of blood in his stools. This is a very complex patient. In terms of the GI bleeding, it's less likely to be upper as he has no hematemesis, melena, and he is compliant with his PPI. He has a TIPS in place but will need to check if it's patent. He is constipated so hemorrhoidal bleeding or diverticular is a concern. It seems on chart review he has recurrent issues with hematochezia, probably related to his cirrhosis and pressure from volume overload on hemorrhoids. It seems his hypotension has been a problem in the past and he carries a diagnosis of adrenal insufficiency but I don't see if any official testing was done. Cirrhotics can have mild adrenal insufficiency due to their liver disease. Long story short, I don't want to jump into doing a procedure right now. MELD 3.0 is 21  - recommend U/S with dopplers to assess for TIPS patency - clear liquids - ordered antibiotics given history of cirrhosis and GI bleeding - not sure where the adrenal insufficiency diagnosis came from but he did have low morning cortisol so likely would benefit from cortef - f/u on CTA - trend CBC's, daily likely fine for now - two large bore IV's - check drug screen - daily CMP  and INR - needs substance abuse counseling - monitor for recurrent bleeding - stool softeners - transfuse if hemoglobin < 8 given CAD - may need IR help for hemorrhoidal embolization  Will continue to follow to assess needs for any procedures.. Please call with any questions or concerns.  Merlyn Lot MD, MPH [facility]

## 2023-08-08 NOTE — ED Notes (Signed)
See triage note, pt reports bilateral leg swelling, left sided chest pain and shob for the past few days. NAD noted. RR even and unlabored. Stretcher locked in lowest position, call bell in reach

## 2023-08-08 NOTE — H&P (Addendum)
NAME:  Ethan Wells, MRN:  [id number], DOB:  1971-07-15, LOS: 1 ADMISSION DATE:  08/08/2023, CONSULTATION DATE:  *** REFERRING MD:  ***, CHIEF COMPLAINT:  ***    HPI  [age] y.o with significant PMH of who presented to the ED with chief complaints of    ED Course: Initial vital signs showed HR of beats/minute, BP mm Hg, the RR 30 breaths/minute, and the oxygen saturation % on and a temperature of 98.69F (36.9C). Patient was lethargic, moaned intermittently, and responded with one-word answers; symmetric movement in the arms and legs was observed. Pertinent Labs/Diagnostics Findings: Na+/ K+:  Glucose: BUN/Cr.:Calcium:  AST/ALT: WBC: Hgb/Hct: Plts:  PCT: negative <0.10  Lactic acid:  COVID PCR: Negative,  troponin:   BNP:   ABG: pO2 ***; pCO2 ***; pH ***;  HCO3 ***, %O2 Sat ***.  CXR> CTH> CTA Chest> CT Abd/pelvis>  Past Medical History  ***  Significant Hospital Events   ***  Consults:  ***  Procedures:  ***  Significant Diagnostic Tests:  : Chest Xray> : Abdominal xray> : Noncontrast CT head> : CTA Chest, abdomen and pelvis>  Interim History / Subjective:      Micro Data:  : SARS-CoV-2 PCR> negative : Influenza PCR> negative : Blood culture x2> : Urine Culture> : MRSA PCR>>  : Strep pneumo urinary antigen> : Legionella urinary antigen> : Mycoplasma pneumonia>  Antimicrobials:  Vancomycin Cefepime Azithromycin Ceftriaxone Metronidazole  OBJECTIVE  Blood pressure (!) 96/50, pulse 88, temperature 98.5 F (36.9 C), temperature source Oral, resp. rate 19, SpO2 100%.       No intake or output data in the 24 hours ending 08/09/23 0000 There were no vitals filed for this visit.   Physical Examination  GENERAL:  year-old critically ill patient lying in the bed  EYES: PEERLA. No scleral icterus. Extraocular muscles intact.  HEENT: Head atraumatic, normocephalic. Oropharynx and nasopharynx clear.  NECK:  No JVD, supple  LUNGS: Normal breath sounds  bilaterally.  No use of accessory muscles of respiration.  CARDIOVASCULAR: S1, S2 normal. No murmurs, rubs, or gallops.  ABDOMEN: Soft, NTND EXTREMITIES: No swelling or erythema.  Capillary refill < 3 seconds in all extremities. Pulses palpable distally. NEUROLOGIC: The patient is . No new or focal neurological deficit appreciated. Cranial nerves are intact.  SKIN: No obvious rash, lesion, or ulcer. Warm to touch Labs/imaging that I {ACTIONS; HAVE/HAVE NOT:19434}personally reviewed  (right click and "Reselect all SmartList Selections" daily)     Labs   CBC: Recent Labs  Lab 08/08/23 1433 08/08/23 1917  WBC 4.6  --   HGB 10.4* 7.9*  HCT 31.5* 24.2*  MCV 93.2  --   PLT 113*  --     Basic Metabolic Panel: Recent Labs  Lab 08/08/23 1433  NA 134*  K 2.8*  CL 99  CO2 22  GLUCOSE 102*  BUN 9  CREATININE 0.64  CALCIUM 8.4*   GFR: CrCl cannot be calculated (Unknown ideal weight.). Recent Labs  Lab 08/08/23 1433  WBC 4.6    Liver Function Tests: Recent Labs  Lab 08/08/23 1433  AST 59*  ALT 20  ALKPHOS 219*  BILITOT 5.9*  PROT 7.4  ALBUMIN 3.1*   No results for input(s): "LIPASE", "AMYLASE" in the last 168 hours. No results for input(s): "AMMONIA" in the last 168 hours.  ABG    Component Value Date/Time   PHART 7.439 04/12/2023 0414   PCO2ART 37.6 04/12/2023 0414   PO2ART 80 (L) 04/12/2023 0414  HCO3 17.0 (L) 05/10/2023 0657   TCO2 27 04/12/2023 0414   ACIDBASEDEF 5.4 (H) 05/10/2023 0657   O2SAT 94.4 05/10/2023 0657     Coagulation Profile: Recent Labs  Lab 08/08/23 1952  INR 1.7*    Cardiac Enzymes: No results for input(s): "CKTOTAL", "CKMB", "CKMBINDEX", "TROPONINI" in the last 168 hours.  HbA1C: Hgb A1c MFr Bld  Date/Time Value Ref Range Status  05/04/2023 07:47 PM 5.2 4.8 - 5.6 % Final    Comment:    (NOTE) Pre diabetes:          5.7%-6.4%  Diabetes:              >6.4%  Glycemic control for   <7.0% adults with diabetes    02/22/2023 04:50 AM 5.6 4.8 - 5.6 % Final    Comment:    (NOTE)         Prediabetes: 5.7 - 6.4         Diabetes: >6.4         Glycemic control for adults with diabetes: <7.0     CBG: No results for input(s): "GLUCAP" in the last 168 hours.  Review of Systems:   ***  Past Medical History  He,  has a past medical history of CAD (coronary artery disease), Cardiac arrest - ventricular fibrillation, Chronic pain, Cocaine abuse (HCC), Depression with anxiety, ETOH abuse, GERD (gastroesophageal reflux disease), GI bleed, Hemorrhoids, Hyperlipidemia, Hypertension, Ischemic cardiomyopathy, Marijuana abuse, Migraines, and Tobacco abuse.   Surgical History    Past Surgical History:  Procedure Laterality Date   COLONOSCOPY  08/09/2021   Procedure: COLONOSCOPY;  Surgeon: Midge Minium, MD;  Location: [facility] ENDOSCOPY;  Service: Endoscopy;;   CORONARY ANGIOPLASTY WITH STENT PLACEMENT     ESOPHAGEAL BANDING  04/08/2023   Procedure: ESOPHAGEAL BANDING;  Surgeon: Tressia Danas, MD;  Location: [facility] ENDOSCOPY;  Service: Gastroenterology;;   ESOPHAGOGASTRODUODENOSCOPY N/A 05/30/2022   Procedure: ESOPHAGOGASTRODUODENOSCOPY (EGD);  Surgeon: Toney Reil, MD;  Location: [facility] ENDOSCOPY;  Service: Gastroenterology;  Laterality: N/A;   ESOPHAGOGASTRODUODENOSCOPY (EGD) WITH PROPOFOL N/A 03/01/2021   Procedure: ESOPHAGOGASTRODUODENOSCOPY (EGD) WITH PROPOFOL;  Surgeon: Toney Reil, MD;  Location: [facility];  Service: Endoscopy;  Laterality: N/A;   ESOPHAGOGASTRODUODENOSCOPY (EGD) WITH PROPOFOL N/A 08/09/2021   Procedure: ESOPHAGOGASTRODUODENOSCOPY (EGD) WITH PROPOFOL;  Surgeon: Midge Minium, MD;  Location: [facility] ENDOSCOPY;  Service: Endoscopy;  Laterality: N/A;   ESOPHAGOGASTRODUODENOSCOPY (EGD) WITH PROPOFOL N/A 03/01/2023   Procedure: ESOPHAGOGASTRODUODENOSCOPY (EGD) WITH PROPOFOL;  Surgeon: Jaynie Collins, DO;  Location: [facility] ENDOSCOPY;  Service: Gastroenterology;  Laterality: N/A;    ESOPHAGOGASTRODUODENOSCOPY (EGD) WITH PROPOFOL N/A 04/08/2023   Procedure: ESOPHAGOGASTRODUODENOSCOPY (EGD) WITH PROPOFOL;  Surgeon: Tressia Danas, MD;  Location: [facility] ENDOSCOPY;  Service: Gastroenterology;  Laterality: N/A;   EVALUATION UNDER ANESTHESIA WITH HEMORRHOIDECTOMY N/A 10/31/2022   Procedure: EXAM UNDER ANESTHESIA WITH SUTURE LIGATION OF TWO BLEEDING PEDICLES;  Surgeon: Henrene Dodge, MD;  Location: [facility] ORS;  Service: General;  Laterality: N/A;   FLEXIBLE SIGMOIDOSCOPY N/A 10/02/2022   Procedure: FLEXIBLE SIGMOIDOSCOPY;  Surgeon: Meryl Dare, MD;  Location: [facility] ENDOSCOPY;  Service: Gastroenterology;  Laterality: N/A;   IR TIPS  04/11/2023   IR US GUIDE VASC ACCESS RIGHT  04/11/2023   IR US GUIDE VASC ACCESS RIGHT  04/11/2023   IR US GUIDE VASC ACCESS RIGHT  04/11/2023   LEFT HEART CATH AND CORONARY ANGIOGRAPHY N/A 11/04/2022   Procedure: LEFT HEART CATH AND CORONARY ANGIOGRAPHY;  Surgeon: Iran Ouch, MD;  Location: [facility] INVASIVE CV  LAB;  Service: Cardiovascular;  Laterality: N/A;   RADIOLOGY WITH ANESTHESIA N/A 04/11/2023   Procedure: IR WITH ANESTHESIA;  Surgeon: Oley Balm, MD;  Location: [facility] OR;  Service: Radiology;  Laterality: N/A;   VISCERAL ANGIOGRAPHY N/A 10/24/2022   Procedure: VISCERAL ANGIOGRAPHY;  Surgeon: Annice Needy, MD;  Location: [facility] INVASIVE CV LAB;  Service: Cardiovascular;  Laterality: N/A;   VISCERAL ARTERY INTERVENTION N/A 05/09/2023   Procedure: VISCERAL ARTERY INTERVENTION;  Surgeon: Renford Dills, MD;  Location: [facility] INVASIVE CV LAB;  Service: Cardiovascular;  Laterality: N/A;     Social History   reports that he has been smoking cigarettes. He has a 20.5 pack-year smoking history. He has never used smokeless tobacco. He reports current alcohol use of about 12.0 standard drinks of alcohol per week. He reports current drug use. Drugs: Cocaine and Marijuana.   Family History   His family history includes Alcohol abuse in his father;  Coronary artery disease in his mother; Other in his paternal grandmother and another family member.   Allergies Allergies  Allergen Reactions   Penicillins Hives and Rash    Tolerated Ceftriaxone 10/2022     Home Medications  Prior to Admission medications   Medication Sig Start Date End Date Taking? Authorizing Provider  gabapentin (NEURONTIN) 100 MG capsule Take 1 capsule (100 mg total) by mouth 2 (two) times daily. 07/05/23 08/08/23 Yes Wieting, Richard, MD  methocarbamol (ROBAXIN) 750 MG tablet TAKE 1 TABLET (750 MG TOTAL) BY MOUTH 2 (TWO) TIMES DAILY AS NEEDED FOR MUSCLE SPASMS. 07/28/23  Yes Medina-Vargas, Monina C, NP  oxyCODONE (OXY IR/ROXICODONE) 5 MG immediate release tablet Take 1 tablet (5 mg total) by mouth every 6 (six) hours as needed for moderate pain. 07/05/23  Yes Wieting, Richard, MD  pantoprazole (PROTONIX) 40 MG tablet Take 1 tablet (40 mg total) by mouth 2 (two) times daily. 07/05/23 08/08/23 Yes Wieting, Richard, MD  QUEtiapine (SEROQUEL) 50 MG tablet Take 1 tablet (50 mg total) by mouth at bedtime. 07/05/23 08/08/23 Yes Wieting, Richard, MD  rosuvastatin (CRESTOR) 40 MG tablet TAKE 1 TABLET BY MOUTH EVERY DAY 07/28/23  Yes Medina-Vargas, Monina C, NP  clopidogrel (PLAVIX) 75 MG tablet Take 1 tablet (75 mg total) by mouth daily. Patient not taking: Reported on 07/08/2023 07/05/23   Alford Highland, MD  folic acid (FOLVITE) 1 MG tablet Take 1 tablet (1 mg total) by mouth daily. Patient not taking: Reported on 07/08/2023 07/05/23   Alford Highland, MD  LORazepam (ATIVAN) 0.5 MG tablet One tab po three times a day for two days then one tab twice a day for two days then one tab po daily for three days Patient not taking: Reported on 07/08/2023 07/05/23   Alford Highland, MD  Multiple Vitamin (MULTIVITAMIN WITH MINERALS) TABS tablet Take 1 tablet by mouth daily. Patient not taking: Reported on 07/08/2023 07/05/23   Alford Highland, MD  nicotine (NICODERM CQ - DOSED IN MG/24 HOURS) 21  mg/24hr patch One 21 mg patch chest wall daily (okay to substitute generic) Patient not taking: Reported on 07/08/2023 07/05/23   Alford Highland, MD  nitroGLYCERIN (NITROSTAT) 0.4 MG SL tablet Place 1 tablet (0.4 mg total) under the tongue every 5 (five) minutes as needed for chest pain. Patient not taking: Reported on 07/08/2023 07/05/23   Alford Highland, MD  ondansetron (ZOFRAN) 4 MG tablet Take 1 tablet (4 mg total) by mouth every 8 (eight) hours as needed for nausea or vomiting. Patient not taking: Reported on 07/08/2023 07/05/23  Alford Highland, MD  rifaximin (XIFAXAN) 550 MG TABS tablet Take 1 tablet (550 mg total) by mouth 2 (two) times daily. Patient not taking: Reported on 07/08/2023 07/05/23   Alford Highland, MD  thiamine (VITAMIN B1) 100 MG tablet Take 1 tablet (100 mg total) by mouth daily. Patient not taking: Reported on 07/08/2023 07/05/23   Alford Highland, MD      Active Hospital Problem list   See systems below  Assessment & Plan:        Best practice:  Diet:  {ONGE:95284} Pain/Anxiety/Delirium protocol (if indicated): {Pain/Anxiety/Delirium:26941} VAP protocol (if indicated): {VAP:29640} DVT prophylaxis: {DVT Prophylaxis:26933} GI prophylaxis: {XL:24401} Glucose control:  {Glucose Control:26935} Central venous access:  {Central Venous Access:26936} Arterial line:  {Central Venous Access:26936} Foley:  {Central Venous Access:26936} Mobility:  {Mobility:26937}  PT consulted: {PT Consult:26938} Last date of multidisciplinary goals of care discussion [***] Code Status:  {Code Status:26939} Disposition: ***   = Goals of Care = Code Status Order: @CODE @   Primary Emergency Contact: Payne,Betty Wishes to pursue full aggressive treatment and intervention options, including CPR and intubation, but goals of care will be addressed on going with family if that should become necessary. Patient wishes to pursue ongoing treatment with relatively conservative measures  (e.g., IV fluids, antibiotics), but would not wish to escalate to ICU level of care or other invasive measures. Patient wishes to pursue ongoing treatment, but concurred that if deteriorated to pulselessness, patient would prefer a natural death as opposed to invasive measures such as CPR and intubation.  Family should be immediately contacted in such situations.  Critical care time: 45 minutes        Webb Silversmith DNP, CCRN, FNP-C, AGACNP-BC Acute Care & Family Nurse Practitioner [facility] PCCM on call pager [phone]

## 2023-08-08 NOTE — ED Triage Notes (Addendum)
Pt c/o leg swelling x1 week and left sided CP that radiates to the left arm x2 days. Pt reports dark red stools x3 days. Pt reports SHOB, headaches, dizziness. Nonpitting edema noted in lower extremities bilaterally.

## 2023-08-08 NOTE — ED Notes (Signed)
Pt ambulatory to restroom

## 2023-08-09 ENCOUNTER — Inpatient Hospital Stay: Payer: Medicare HMO | Admitting: Anesthesiology

## 2023-08-09 ENCOUNTER — Encounter
Admission: EM | Disposition: A | Discharge status: Home / Self Care | Payer: Self-pay | Source: Home / Self Care | Attending: Internal Medicine

## 2023-08-09 ENCOUNTER — Inpatient Hospital Stay: Payer: Medicare HMO

## 2023-08-09 ENCOUNTER — Encounter: Payer: Self-pay | Admitting: Internal Medicine

## 2023-08-09 DIAGNOSIS — I700 Atherosclerosis of aorta: Secondary | ICD-10-CM | POA: Diagnosis not present

## 2023-08-09 DIAGNOSIS — I5033 Acute on chronic diastolic (congestive) heart failure: Secondary | ICD-10-CM | POA: Diagnosis not present

## 2023-08-09 DIAGNOSIS — D649 Anemia, unspecified: Secondary | ICD-10-CM | POA: Diagnosis not present

## 2023-08-09 DIAGNOSIS — F17210 Nicotine dependence, cigarettes, uncomplicated: Secondary | ICD-10-CM | POA: Diagnosis not present

## 2023-08-09 DIAGNOSIS — K269 Duodenal ulcer, unspecified as acute or chronic, without hemorrhage or perforation: Secondary | ICD-10-CM | POA: Diagnosis not present

## 2023-08-09 DIAGNOSIS — K820 Obstruction of gallbladder: Secondary | ICD-10-CM | POA: Diagnosis not present

## 2023-08-09 DIAGNOSIS — I110 Hypertensive heart disease with heart failure: Secondary | ICD-10-CM | POA: Diagnosis not present

## 2023-08-09 DIAGNOSIS — K7460 Unspecified cirrhosis of liver: Secondary | ICD-10-CM | POA: Diagnosis not present

## 2023-08-09 DIAGNOSIS — I959 Hypotension, unspecified: Secondary | ICD-10-CM | POA: Diagnosis not present

## 2023-08-09 DIAGNOSIS — Z452 Encounter for adjustment and management of vascular access device: Secondary | ICD-10-CM | POA: Diagnosis not present

## 2023-08-09 DIAGNOSIS — I2510 Atherosclerotic heart disease of native coronary artery without angina pectoris: Secondary | ICD-10-CM | POA: Diagnosis not present

## 2023-08-09 DIAGNOSIS — K922 Gastrointestinal hemorrhage, unspecified: Secondary | ICD-10-CM | POA: Diagnosis not present

## 2023-08-09 HISTORY — PX: HEMOSTASIS CONTROL: SHX6838

## 2023-08-09 HISTORY — PX: ESOPHAGOGASTRODUODENOSCOPY (EGD) WITH PROPOFOL: SHX5813

## 2023-08-09 LAB — CBC
HCT: 19.4 % — ABNORMAL LOW (ref 39.0–52.0)
Hemoglobin: 6.8 g/dL — ABNORMAL LOW (ref 13.0–17.0)
MCH: 31.5 pg (ref 26.0–34.0)
MCHC: 35.1 g/dL (ref 30.0–36.0)
MCV: 89.8 fL (ref 80.0–100.0)
Platelets: 133 10*3/uL — ABNORMAL LOW (ref 150–400)
RBC: 2.16 MIL/uL — ABNORMAL LOW (ref 4.22–5.81)
RDW: 18.6 % — ABNORMAL HIGH (ref 11.5–15.5)
WBC: 9.9 10*3/uL (ref 4.0–10.5)
nRBC: 0 % (ref 0.0–0.2)

## 2023-08-09 LAB — BASIC METABOLIC PANEL
Anion gap: 4 — ABNORMAL LOW (ref 5–15)
BUN: 14 mg/dL (ref 6–20)
CO2: 23 mmol/L (ref 22–32)
Calcium: 6.8 mg/dL — ABNORMAL LOW (ref 8.9–10.3)
Chloride: 108 mmol/L (ref 98–111)
Creatinine, Ser: 0.58 mg/dL — ABNORMAL LOW (ref 0.61–1.24)
GFR, Estimated: >60 mL/min (ref >60)
Glucose, Bld: 126 mg/dL — ABNORMAL HIGH (ref 70–99)
Potassium: 4.3 mmol/L (ref 3.5–5.1)
Sodium: 135 mmol/L (ref 135–145)

## 2023-08-09 LAB — MAGNESIUM: Magnesium: 1.4 mg/dL — ABNORMAL LOW (ref 1.7–2.4)

## 2023-08-09 LAB — HEMOGLOBIN AND HEMATOCRIT, BLOOD
HCT: 23.5 % — ABNORMAL LOW (ref 39.0–52.0)
HCT: 22.2 % — ABNORMAL LOW (ref 39.0–52.0)
HCT: 20 % — ABNORMAL LOW (ref 39.0–52.0)
Hemoglobin: 8 g/dL — ABNORMAL LOW (ref 13.0–17.0)
Hemoglobin: 7.8 g/dL — ABNORMAL LOW (ref 13.0–17.0)
Hemoglobin: 7 g/dL — ABNORMAL LOW (ref 13.0–17.0)

## 2023-08-09 LAB — LACTIC ACID, PLASMA
Lactic Acid, Venous: 2.3 mmol/L (ref 0.5–1.9)
Lactic Acid, Venous: 3.6 mmol/L (ref 0.5–1.9)

## 2023-08-09 LAB — PHOSPHORUS: Phosphorus: 2 mg/dL — ABNORMAL LOW (ref 2.5–4.6)

## 2023-08-09 LAB — MRSA NEXT GEN BY PCR, NASAL: MRSA by PCR Next Gen: NOT DETECTED

## 2023-08-09 LAB — PREPARE RBC (CROSSMATCH)

## 2023-08-09 LAB — ETHANOL: Alcohol, Ethyl (B): <10 mg/dL (ref <10)

## 2023-08-09 SURGERY — ESOPHAGOGASTRODUODENOSCOPY (EGD) WITH PROPOFOL
Anesthesia: Monitor Anesthesia Care

## 2023-08-09 MED ORDER — SODIUM CHLORIDE 0.9% IV SOLUTION: Freq: Once | INTRAVENOUS | Status: AC

## 2023-08-09 MED ORDER — HYDROCORTISONE SOD SUC (PF) 100 MG IJ SOLR
50.0000 mg | Freq: Three times a day (TID) | INTRAMUSCULAR | Status: DC
  Administered 2023-08-09 – 2023-08-10 (×5): 50 mg via INTRAVENOUS
  Filled 2023-08-09 (×5): qty 2

## 2023-08-09 MED ORDER — PROPOFOL 10 MG/ML IV BOLUS
INTRAVENOUS | Status: AC
  Filled 2023-08-09: qty 20

## 2023-08-09 MED ORDER — PANTOPRAZOLE SODIUM 40 MG IV SOLR
40.0000 mg | Freq: Two times a day (BID) | INTRAVENOUS | Status: DC
  Administered 2023-08-09 – 2023-08-12 (×7): 40 mg via INTRAVENOUS
  Filled 2023-08-09 (×7): qty 10

## 2023-08-09 MED ORDER — LACTATED RINGERS IV BOLUS
500.0000 mL | Freq: Once | INTRAVENOUS | Status: AC
  Administered 2023-08-09: 500 mL via INTRAVENOUS

## 2023-08-09 MED ORDER — VITAMIN K1 10 MG/ML IJ SOLN
10.0000 mg | Freq: Once | INTRAVENOUS | Status: AC
  Administered 2023-08-09: 10 mg via INTRAVENOUS
  Filled 2023-08-09: qty 1

## 2023-08-09 MED ORDER — PHENYLEPHRINE 80 MCG/ML (10ML) SYRINGE FOR IV PUSH (FOR BLOOD PRESSURE SUPPORT)
PREFILLED_SYRINGE | INTRAVENOUS | Status: AC
  Filled 2023-08-09: qty 10

## 2023-08-09 MED ORDER — GLYCOPYRROLATE 0.2 MG/ML IJ SOLN
INTRAMUSCULAR | Status: AC
  Filled 2023-08-09: qty 1

## 2023-08-09 MED ORDER — SODIUM CHLORIDE 0.9% IV SOLUTION: Freq: Once | INTRAVENOUS | Status: DC

## 2023-08-09 MED ORDER — CHLORDIAZEPOXIDE HCL 10 MG PO CAPS
10.0000 mg | ORAL_CAPSULE | Freq: Three times a day (TID) | ORAL | Status: DC
  Administered 2023-08-09 – 2023-08-10 (×5): 10 mg via ORAL
  Filled 2023-08-09 (×3): qty 2
  Filled 2023-08-09: qty 1
  Filled 2023-08-09: qty 2

## 2023-08-09 MED ORDER — ACETAMINOPHEN 325 MG PO TABS
650.0000 mg | ORAL_TABLET | Freq: Four times a day (QID) | ORAL | Status: DC | PRN
  Administered 2023-08-09 – 2023-08-23 (×8): 650 mg via ORAL
  Filled 2023-08-09 (×8): qty 2

## 2023-08-09 MED ORDER — NOREPINEPHRINE 4 MG/250ML-% IV SOLN
INTRAVENOUS | Status: AC
  Administered 2023-08-09: 4 mg via INTRAVENOUS
  Filled 2023-08-09: qty 250

## 2023-08-09 MED ORDER — PROPOFOL 500 MG/50ML IV EMUL
INTRAVENOUS | Status: DC | PRN
  Administered 2023-08-09: 40 ug via INTRAVENOUS
  Administered 2023-08-09: 20 ug via INTRAVENOUS
  Administered 2023-08-09: 60 ug via INTRAVENOUS
  Administered 2023-08-09: 50 ug via INTRAVENOUS
  Administered 2023-08-09: 30 ug via INTRAVENOUS

## 2023-08-09 MED ORDER — NOREPINEPHRINE 4 MG/250ML-% IV SOLN
INTRAVENOUS | Status: DC | PRN
  Administered 2023-08-09: 1 ug/kg/min via INTRAVENOUS

## 2023-08-09 MED ORDER — SODIUM CHLORIDE 0.9% FLUSH
10.0000 mL | Freq: Two times a day (BID) | INTRAVENOUS | Status: DC
  Administered 2023-08-10 – 2023-08-23 (×21): 10 mL

## 2023-08-09 MED ORDER — GLYCOPYRROLATE 0.2 MG/ML IJ SOLN
INTRAMUSCULAR | Status: DC | PRN
  Administered 2023-08-09: .2 mg via INTRAVENOUS

## 2023-08-09 MED ORDER — MAGNESIUM SULFATE 4 GM/100ML IV SOLN
4.0000 g | Freq: Once | INTRAVENOUS | Status: AC
  Administered 2023-08-09: 4 g via INTRAVENOUS
  Filled 2023-08-09: qty 100

## 2023-08-09 MED ORDER — FOLIC ACID 1 MG PO TABS
1.0000 mg | ORAL_TABLET | Freq: Every day | ORAL | Status: DC
  Administered 2023-08-09 – 2023-08-25 (×16): 1 mg via ORAL
  Filled 2023-08-09 (×16): qty 1

## 2023-08-09 MED ORDER — VASOPRESSIN 20 UNIT/ML IV SOLN: 0.0000 [IU]/min | INTRAVENOUS | Status: DC

## 2023-08-09 MED ORDER — SODIUM CHLORIDE 0.9% FLUSH: 10.0000 mL | INTRAVENOUS | Status: DC | PRN

## 2023-08-09 MED ORDER — THIAMINE MONONITRATE 100 MG PO TABS
100.0000 mg | ORAL_TABLET | Freq: Every day | ORAL | Status: DC
  Administered 2023-08-09 – 2023-08-14 (×6): 100 mg via ORAL
  Filled 2023-08-09 (×6): qty 1

## 2023-08-09 MED ORDER — MIDODRINE HCL 5 MG PO TABS
10.0000 mg | ORAL_TABLET | Freq: Three times a day (TID) | ORAL | Status: DC
  Administered 2023-08-09 – 2023-08-15 (×17): 10 mg via ORAL
  Filled 2023-08-09 (×18): qty 2

## 2023-08-09 MED ORDER — CHLORHEXIDINE GLUCONATE CLOTH 2 % EX PADS
6.0000 | MEDICATED_PAD | Freq: Every day | CUTANEOUS | Status: DC
  Administered 2023-08-09 – 2023-08-18 (×9): 6 via TOPICAL

## 2023-08-09 MED ORDER — VASOPRESSIN 20 UNITS/100 ML INFUSION FOR SHOCK
0.0000 [IU]/min | INTRAVENOUS | Status: DC
  Administered 2023-08-09: 0.03 [IU]/min via INTRAVENOUS
  Administered 2023-08-09 (×2): 0.2 [IU]/min via INTRAVENOUS
  Filled 2023-08-09 (×3): qty 100

## 2023-08-09 MED ORDER — SODIUM CHLORIDE 0.9 % IV SOLN
50.0000 ug/h | INTRAVENOUS | Status: DC
  Administered 2023-08-09 (×2): 50 ug/h via INTRAVENOUS
  Filled 2023-08-09 (×2): qty 1

## 2023-08-09 NOTE — Consult Note (Signed)
PHARMACY CONSULT NOTE - ELECTROLYTES  Pharmacy Consult for Electrolyte Monitoring and Replacement   Recent Labs: Height: 5\' 6"  (167.6 cm) Weight: 83.2 kg (183 lb 6.8 oz) IBW/kg (Calculated) : 63.8 Estimated Creatinine Clearance: 110.6 mL/min (A) (by C-G formula based on SCr of 0.58 mg/dL (L)). Potassium (mmol/L)  Date Value  08/09/2023 4.3  09/06/2013 3.5   Magnesium (mg/dL)  Date Value  96/03/5408 1.4 (L)  03/09/2013 2.2   Calcium (mg/dL)  Date Value  81/19/1478 6.8 (L)   Calcium, Total (mg/dL)  Date Value  29/56/2130 8.9   Albumin (g/dL)  Date Value  86/57/8469 3.1 (L)  09/06/2013 4.0   Phosphorus (mg/dL)  Date Value  62/95/2841 2.0 (L)   Sodium (mmol/L)  Date Value  08/09/2023 135  09/06/2013 134 (L)    Assessment  Ethan Wells is a 52 y.o. male presenting with abdominal pain. PMH significant for alcoholic liver cirrhosis with portal gastropathy, s/p of TIPS, superior mesenteric artery stenosis, HTN, HLD, CAD, STEMI, dCHF, GERD, depression with anxiety, adrenal insufficiency (Addison's disease), migraine headache, cardiac arrest due to V-fib, GI bleed,cocaine abuse, tobacco abuse. Pharmacy has been consulted to monitor and replace electrolytes.  Goal of Therapy: Electrolytes WNL  Plan:  4 grams IV manesium sulfate x 1 per NP Check BMP, Mg, Phos with AM labs  Thank you for allowing pharmacy to be a part of this patient's care.  Burnis Medin, PharmD, BCPS 08/09/2023 7:40 AM

## 2023-08-09 NOTE — Anesthesia Postprocedure Evaluation (Signed)
Anesthesia Post Note  [name]  Procedure(s) Performed: ESOPHAGOGASTRODUODENOSCOPY (EGD) WITH PROPOFOL HEMOSTASIS CONTROL  Patient location during evaluation: ICU Anesthesia Type: MAC Level of consciousness: awake and alert Pain management: pain level controlled Vital Signs Assessment: post-procedure vital signs reviewed and stable Respiratory status: spontaneous breathing, nonlabored ventilation and respiratory function stable Cardiovascular status: blood pressure returned to baseline and stable Postop Assessment: no apparent nausea or vomiting Anesthetic complications: no   No notable events documented.   Last Vitals:  Vitals:   08/09/23 1530 08/09/23 1545  BP: (!) 121/93 (!) 142/77  Pulse: (!) 130 (!) 110  Resp: (!) 22 (!) 26  Temp:    SpO2: 100% 99%    Last Pain:  Vitals:   08/09/23 1430  TempSrc: Axillary  PainSc:                  Foye Deer

## 2023-08-09 NOTE — Procedures (Signed)
Central Venous Catheter Insertion Procedure Note  Ethan Wells  [id number]  1971-04-27  Date:08/09/23  Time:3:32 AM   Provider Performing:Raiden Yearwood A Ammon Muscatello   Procedure: Insertion of Non-tunneled Central Venous Catheter(36556) with US guidance (40981)   Indication(s) Medication administration and Difficult access  Consent Unable to obtain consent due to emergent nature of procedure.  Anesthesia Topical only with 1% lidocaine   Timeout Verified patient identification, verified procedure, site/side was marked, verified correct patient position, special equipment/implants available, medications/allergies/relevant history reviewed, required imaging and test results available.  Sterile Technique Maximal sterile technique including full sterile barrier drape, hand hygiene, sterile gown, sterile gloves, mask, hair covering, sterile ultrasound probe cover (if used).  Procedure Description Area of catheter insertion was cleaned with chlorhexidine and draped in sterile fashion.  With real-time ultrasound guidance a central venous catheter was placed into the right internal jugular vein. Nonpulsatile blood flow and easy flushing noted in all ports.  The catheter was sutured in place and sterile dressing applied.  Complications/Tolerance None; patient tolerated the procedure well. Chest X-ray is ordered to verify placement for internal jugular or subclavian cannulation.   Chest x-ray is not ordered for femoral cannulation.  EBL Minimal  Specimen(s) None   Webb Silversmith, DNP, CCRN, FNP-C, AGACNP-BC Acute Care & Family Nurse Practitioner  [facility]  See Amion for personal pager PCCM on call pager [phone] until 7 am

## 2023-08-09 NOTE — Plan of Care (Signed)

## 2023-08-09 NOTE — Progress Notes (Addendum)
CT Angio GI Bleed  showed 1. Active GI bleed in the region of the gastric antrum/pylorus and proximal duodenum.2. Cirrhosis with patent appearing TIPS. 3. Other non-acute findings as above. Discussed abnormal findings with on call GI Dr. Mia Creek and IR oncall Dr. Fredia Sorrow who both reviewed and agreed does not look like a variceal bleed by CTA. There is thickening of the distal stomach and proximal duodenum and the focal bleed is in the duodenal bulb. Likely a bleeding ulcer. TIPS is widely patent by CT. Recommend that patient be stabilized with consideration of endoscopy after some resuscitation.   Webb Silversmith, DNP, CCRN, FNP-C, AGACNP-BC Acute Care & Family Nurse Practitioner  [facility]  See Amion for personal pager PCCM on call pager [phone] until 7 am

## 2023-08-09 NOTE — Progress Notes (Signed)
eLink Physician-Brief Progress Note Patient Name: Ethan Wells DOB: 23-Aug-1971 MRN: [id number]   Date of Service  08/09/2023  HPI/Events of Note  Patient admitted with GI bleeding and hypotension, he has a history of ETOH liver disease, Cirrhosis s/p past TIPS.  eICU Interventions  New Patient Evaluation.        [name] 08/09/2023, 12:13 AM

## 2023-08-09 NOTE — Progress Notes (Signed)
Patient left unit alert, by bed with Dr. Laural Benes and CRNA at side. Report called to Okey Regal, RN prior.

## 2023-08-09 NOTE — Anesthesia Preprocedure Evaluation (Signed)
Anesthesia Evaluation  Patient identified by MRN, date of birth, ID band Patient awake    Reviewed: Allergy & Precautions, NPO status , Patient's Chart, lab work & pertinent test results  History of Anesthesia Complications Negative for: history of anesthetic complications  Airway Mallampati: II  TM Distance: >3 FB Neck ROM: full  Mouth opening: Limited Mouth Opening  Dental  (+) Poor Dentition, Edentulous Upper, Chipped, Missing   Pulmonary shortness of breath and with exertion, Current Smoker and Patient abstained from smoking.   Pulmonary exam normal        Cardiovascular Exercise Tolerance: Poor hypertension, + angina with exertion + CAD, + Past MI, + Cardiac Stents, + Peripheral Vascular Disease, +CHF and + DOE   Rhythm:Regular Rate:Normal - Systolic murmurs and - Peripheral Edema Echo 04/11/23 IMPRESSIONS     1. Left ventricular ejection fraction, by estimation, is 55 to 60%. The  left ventricle has normal function. The left ventricle has no regional  wall motion abnormalities. There is mild left ventricular hypertrophy.  Left ventricular diastolic parameters  are consistent with Grade II diastolic dysfunction (pseudonormalization).   2. Right ventricular systolic function is normal. The right ventricular  size is normal. There is normal pulmonary artery systolic pressure.   3. Anterior leaflet has increased thickening, likely calcification. Mild  mitral valve regurgitation.   4. The aortic valve is tricuspid. Aortic valve regurgitation is not  visualized.   5. The inferior vena cava is normal in size with greater than 50%  respiratory variability, suggesting right atrial pressure of 3 mmHg.      LHC 2023:   Mid Cx lesion is 100% stenosed.   Ramus lesion is 85% stenosed.   Prox LAD to Mid LAD lesion is 5% stenosed.   Prox RCA to Mid RCA lesion is 100% stenosed.   Prox RCA lesion is 80% stenosed.   There is  mild left ventricular systolic dysfunction.   LV end diastolic pressure is moderately elevated.   The left ventricular ejection fraction is 45-50% by visual estimate.   1.  Severe two-vessel coronary artery disease with chronically occluded mid left circumflex and mid right coronary artery with collaterals.  These vessels were patent with mild disease on previous cardiac catheterization in 2012.  LAD stent is patent with minimal restenosis.  In addition, there is progression of proximal to mid ramus stenosis. 2.  Mildly reduced LV systolic function with an EF of 45%. 3.  Moderately elevated left ventricular end-diastolic pressure at 26 mmHg.     Neuro/Psych  Headaches PSYCHIATRIC DISORDERS Anxiety Depression       GI/Hepatic ,GERD  Medicated,,(+) Cirrhosis     substance abuse  alcohol use and cocaine useGIB   Endo/Other  negative endocrine ROS    Renal/GU      Musculoskeletal   Abdominal Normal abdominal exam  (+)   Peds  Hematology  (+) Blood dyscrasia, anemia   Anesthesia Other Findings PER ICU NOTE: Ethan Wells is a 52 y.o. male with significant PMH of alcohol abuse, alcoholic liver cirrhosis with portal gastropathy, s/p TIPS, superior mesenteric artery stenosis, HTN, HLD, CAD, STEMI, dCHF, GERD, depression with anxiety, adrenal insufficiency (Addison's disease), migraine headache, cardiac arrest due to V-fib, recurrent GI bleeding, cocaine abuse, tobacco abuse, who presented to the ED with rectal bleeding.   Patient was recently hospitalized from 4/22 - 5/13 due to upper GI bleeding and on 06/30/2023 with gastroenteritis. Pt is s/p of EGD with banding on 4/23 and s/p of  TIPS procedure on 4/27. Pt states that he continues to use cocaine and alcohol.  He states that he last used Alcohol on Wednesday because he could not afford to drink daily. He reports ongoing rectal bleed for the past week with worsening symptoms 8/23.   #Hypovolemic/Hemorrhagic Shock PMHx: HFpEF,  CAD, HTN, HLD -s/p prbc and ffp with NE gtt weaned off and low dose vasopressin running.  -Stress dose steroids due to hx of adrenal insufficiency   #Acute blood loss anemia in the setting of acute GI bleed #Thrombocytopenia PMHx: Alcoholic Cirrhosis s/p TIPS 4/27, esophageal varices,  CT Angio GI Bleed  showed 1. Active GI bleed in the region of the gastric antrum/pylorus and proximal duodenum.2. Cirrhosis with patent appearing TIPS.    #Hx cirrhosis 2/2 EtOH #Portal gastropathy S/p TIPS 4/27   #Non Anion Gap Metabolic Acidosis due Lactic Acidosis #Hypokalemia- replaced   #Hx adrenal insufficiency  -IV hydrocortisone 50 mg daily in lieu of home oral Solu-Cortef (15mg  daily)    #Hx polysubstance abuse including EtOH #High risk for development of Alcohol withdrawal -UDS+Cocaine, Last Drink on 08/06/23 - Pt report cocaine use sporadically with last use around a week ago. - low dose scheduled Librium 8/24 -CIWA protocol    Past Medical History: No date: CAD (coronary artery disease)     Comment:  a. 01/2011 Anterior STEMI/Cath/PCI: LM nl, LAD 100d               (3.5x40mm Vision BMS placed), LCX 48m, RI 50, RCA min               irregs, EF 40% w/ apical, inferoapical HK. No date: Cardiac arrest - ventricular fibrillation     Comment:  a. In setting of STEMI 01/2011 No date: Chronic pain No date: Cocaine abuse (HCC) No date: Depression with anxiety No date: ETOH abuse     Comment:  a. 6-12 beers/day No date: GERD (gastroesophageal reflux disease) No date: Hemorrhoids No date: Hyperlipidemia No date: Hypertension No date: Ischemic cardiomyopathy     Comment:  a. 06/2011 Echo: EF 45-50%, No rwma No date: Marijuana abuse No date: Migraines No date: Tobacco abuse     Comment:  a. 1/2 ppd x 26 yrs  Past Surgical History: 08/09/2021: COLONOSCOPY     Comment:  Procedure: COLONOSCOPY;  Surgeon: Midge Minium, MD;                Location: [facility] ENDOSCOPY;  Service: Endoscopy;; No  date: CORONARY ANGIOPLASTY WITH STENT PLACEMENT 05/30/2022: ESOPHAGOGASTRODUODENOSCOPY; N/A     Comment:  Procedure: ESOPHAGOGASTRODUODENOSCOPY (EGD);  Surgeon:               Toney Reil, MD;  Location: [facility] ENDOSCOPY;                Service: Gastroenterology;  Laterality: N/A; 03/01/2021: ESOPHAGOGASTRODUODENOSCOPY (EGD) WITH PROPOFOL; N/A     Comment:  Procedure: ESOPHAGOGASTRODUODENOSCOPY (EGD) WITH               PROPOFOL;  Surgeon: Toney Reil, MD;  Location:               [facility];  Service: Endoscopy;  Laterality:               N/A; 08/09/2021: ESOPHAGOGASTRODUODENOSCOPY (EGD) WITH PROPOFOL; N/A     Comment:  Procedure: ESOPHAGOGASTRODUODENOSCOPY (EGD) WITH               PROPOFOL;  Surgeon: Midge Minium, MD;  Location: [facility]               ENDOSCOPY;  Service: Endoscopy;  Laterality: N/A; 10/02/2022: FLEXIBLE SIGMOIDOSCOPY; N/A     Comment:  Procedure: FLEXIBLE SIGMOIDOSCOPY;  Surgeon: Meryl Dare, MD;  Location: [facility] ENDOSCOPY;  Service:               Gastroenterology;  Laterality: N/A; 10/24/2022: VISCERAL ANGIOGRAPHY; N/A     Comment:  Procedure: VISCERAL ANGIOGRAPHY;  Surgeon: Annice Needy,              MD;  Location: [facility] INVASIVE CV LAB;  Service:               Cardiovascular;  Laterality: N/A;  BMI    Body Mass Index: 31.00 kg/m      Reproductive/Obstetrics negative OB ROS                              Anesthesia Physical Anesthesia Plan  ASA: 4  Anesthesia Plan: MAC   Post-op Pain Management: Minimal or no pain anticipated   Induction: Intravenous  PONV Risk Score and Plan: 1 and TIVA and Propofol infusion  Airway Management Planned: Natural Airway and Nasal Cannula  Additional Equipment:   Intra-op Plan:   Post-operative Plan:   Informed Consent: I have reviewed the patients History and Physical, chart, labs and discussed the procedure including the risks, benefits and alternatives for the  proposed anesthesia with the patient or authorized representative who has indicated his/her understanding and acceptance.     Dental advisory given  Plan Discussed with: CRNA and Anesthesiologist  Anesthesia Plan Comments:          Anesthesia Quick Evaluation

## 2023-08-09 NOTE — Transfer of Care (Signed)
Immediate Anesthesia Transfer of Care Note  [name]  Procedure(s) Performed: ESOPHAGOGASTRODUODENOSCOPY (EGD) WITH PROPOFOL HEMOSTASIS CONTROL  Patient Location: ICU  Anesthesia Type:MAC  Level of Consciousness: awake, drowsy, patient cooperative, and responds to stimulation  Airway & Oxygen Therapy: Patient Spontanous Breathing  Post-op Assessment: Report given to RN, Post -op Vital signs reviewed and stable, and Patient moving all extremities  Post vital signs: Reviewed and stable  Last Vitals:  Vitals Value Taken Time  BP 114/58 08/09/23 1326  Temp    Pulse 89 08/09/23 1326  Resp 18 08/09/23 1326  SpO2 98 % 08/09/23 1326    Last Pain:  Vitals:   08/09/23 1320  TempSrc:   PainSc: 0-No pain     Stable on NE gtt of 81mcg/min and vaso .02 unit/min.     Complications: No notable events documented.

## 2023-08-09 NOTE — Progress Notes (Signed)
NAME:  Ethan Wells, MRN:  [id number], DOB:  1971/10/07, LOS: 1 ADMISSION DATE:  08/08/2023, CONSULTATION DATE:  08/08/2023 REFERRING MD:  Dr. Derrill Kay, CHIEF COMPLAINT:  Lower GI Bleed   Brief Pt Description / Synopsis:  52 y.o. gentleman with PMHx significant for alcoholic cirrhosis s/p TIPS due to esophageal variceal bleeding, HFpEF, CAD, adrenal insufficiency, polysubstance abuse including cocaine, and celiac/SMA stenosis s/p stenting who is admitted with Hemorrhagic shock due to Acute GI bleed.  History of Present Illness:  Ethan Wells is a 52 y.o. male with significant PMH of alcohol abuse, alcoholic liver cirrhosis with portal gastropathy, s/p TIPS, superior mesenteric artery stenosis, HTN, HLD, CAD, STEMI, dCHF, GERD, depression with anxiety, adrenal insufficiency (Addison's disease), migraine headache, cardiac arrest due to V-fib, recurrent GI bleeding, cocaine abuse, tobacco abuse, who presented to the ED with rectal bleeding.   Patient was recently hospitalized from 4/22 - 5/13 due to upper GI bleeding and on 06/30/2023 with gastroenteritis. Pt is s/p of EGD with banding on 4/23 and s/p of TIPS procedure on 4/27. Pt states that he continues to use cocaine and alcohol.  He states that he last used Alcohol on Wednesday because he could not afford to drink daily. He reports ongoing rectal bleed for the past week with worsening symptoms today.   ED Course: Initial vital signs showed HR of beats/minute, BP 107/60mm Hg, the RR 18 breaths/minute, and the oxygen saturation 98% on  RA and a temperature of 98.24F (36.8C).  Pertinent Labs/Diagnostics Findings: Na+/ K+: 134/2.8.  Glucose: 102:  AST/ALT: 59/20 WBC: 4.6 Hgb/Hct: 10.4/31.5 plts: 113COVID PCR: Negative,  Troponin: 14 BNP: 217.7 CXR> no active cardiopulmonary process.    Patient had significant large bloody bowel movement in the ED and repeat hemoglobin showed significant drop in hemoglobin.  He was transfused emergently  with 2 units of PRBC and GI consulted who recommended a CT GI bleed as well as UDS. PCCM consulted for admission due to high risk for decompensation.  Please see "Significant Hospital Events" section below for full detailed hospital course.  Pertinent  Medical History  alcohol abuse, alcoholic liver cirrhosis with portal gastropathy, s/p TIPS, superior mesenteric artery stenosis, HTN, HLD, CAD, STEMI, dCHF, GERD, depression with anxiety, adrenal insufficiency (Addison's disease), migraine headache, cardiac arrest due to V-fib, recurrent GI bleeding, cocaine abuse, tobacco abuse,   Micro Data:  8/23: SARS-CoV-2 PCR> negative 8/23: Influenza PCR> negative 8/23: Blood culture x2> 8/23: MRSA PCR>>   Antimicrobials:   Anti-infectives (From admission, onward)    Start     Dose/Rate Route Frequency Ordered Stop   08/08/23 2330  cefTRIAXone (ROCEPHIN) 1 g in sodium chloride 0.9 % 100 mL IVPB        1 g 200 mL/hr over 30 Minutes Intravenous Every 24 hours 08/08/23 2323         Significant Hospital Events: Including procedures, antibiotic start and stop dates in addition to other pertinent events   8/23: Admitted for acute GI Bleed requiring blood transfusions and vasopressors.  GI and IR consulted. 8/24: Remains on Levophed and Vasopressin.  Hgb improved to 8.0 following yesterday's transfusions, will give additional 1 unit pRBC and 1 FFP.  GI tentatively planning for EGD today. Will start low dose scheduled librium given high risk for ETOH withdrawal (3 days since last drink).  Interim History / Subjective:  -Transfused 3 units of pRBC's overnight/early this morning -Remains on vasopressors, on room air -Most recent Hgb is 8.0 ~ will give 1  additional pRBC and 1 FFP -GI at bedside, tentatively planning for EGD today -Pt does complain of some mild dizziness/SOB -It has been 3 days since last reported drink, high risk for ETOH withdrawal ~ will start low dose librium  Objective   Blood  pressure (!) 102/54, pulse 78, temperature 98.3 F (36.8 C), temperature source Oral, resp. rate 12, height 5\' 6"  (1.676 m), weight 83.2 kg, SpO2 100%.        Intake/Output Summary (Last 24 hours) at 08/09/2023 0845 Last data filed at 08/09/2023 0830 Gross per 24 hour  Intake 387.5 ml  Output 200 ml  Net 187.5 ml   Filed Weights   08/08/23 2325  Weight: 83.2 kg    Examination: General: Acute on chronically ill-appearing male, laying in bed, on room air, no acute distress HENT: Atraumatic, normocephalic, neck supple, no JVD Lungs: Clear breath sounds throughout, even, nonlabored, normal effort Cardiovascular: Regular rate and rhythm, S1-S2, no murmurs, rubs, gallops Abdomen: Soft, nontender, nondistended, no guarding or rebound tenderness, bowel sounds positive x 4 Extremities: No deformities, no edema, normal bulk and tone Neuro: Awake and alert, oriented x 3, moves all extremities purposefully, no focal deficits, speech clear GU: Deferred, patient voiding using urinal  Resolved Hospital Problem list     Assessment & Plan:   #Hypovolemic/Hemorrhagic Shock PMHx: HFpEF, CAD, HTN, HLD -Continuous cardiac monitoring -Maintain MAP >65 -IV fluids -Transfusions as indicated -Vasopressors as needed to maintain MAP goal -Continue Stress dose steroids due to hx of adrenal insufficiency -Trend lactic acid until normalized -Trend HS Troponin until peaked -Diuresis as BP and renal function permits ~ holding due to shock  #Acute blood loss anemia in the setting of acute GI bleed #Thrombocytopenia PMHx: Alcoholic Cirrhosis s/p TIPS 4/27, esophageal varices,  CT Angio GI Bleed  showed 1. Active GI bleed in the region of the gastric antrum/pylorus and proximal duodenum.2. Cirrhosis with patent appearing TIPS. 3. Other non-acute findings as above. Discussed abnormal findings with on call GI Dr. Mia Creek and IR oncall Dr. Fredia Sorrow who both reviewed and agreed does not look like a variceal  bleed by CTA. There is thickening of the distal stomach and proximal duodenum and the focal bleed is in the duodenal bulb. Likely a bleeding ulcer. TIPS is widely patent by CT. Recommend that patient be stabilized with consideration of endoscopy after some resuscitation.  -Monitor for S/Sx of bleeding -Trend CBC (H&H q6h) -SCD's for VTE Prophylaxis  -Transfuse for Hgb <8 ~ will give additional 1 unit pRBC's and 1 unit FFP on 8/24 -Continue Protonix & Octreotide drips -Continue Empiric Ceftriaxone 1g q24h x7 days for SBP prophylaxis  -GI following, appreciate input ~ tentative plan for EGD 8/24  #Hx cirrhosis 2/2 EtOH #Portal gastropathy S/p TIPS 4/27 -Continue Empiric CTX  -Monitor for hepatic encephalopathy  -lactulose -PPI as above -appreciate GI's management  #Non Anion Gap Metabolic Acidosis due Lactic Acidosis #Hypokalemia -Monitor I&O's / urinary output -Follow BMP -Ensure adequate renal perfusion -Avoid nephrotoxic agents as able -Replace electrolytes as indicated ~ Pharmacy following for assistance with electrolyte replacement   #Hx adrenal insufficiency  -Check cortisol levels, TSH, Free T4 -IV hydrocortisone 50 mg daily in lieu of home oral Solu-Cortef (15mg  daily) > wean as tolerate  #Hx polysubstance abuse including EtOH #High risk for development of Alcohol withdrawal -UDS+Cocaine, Last Drink on 08/06/23 -Thiamine/folate,  -Monitor for s/s of w/d -Will start low dose scheduled Librium 8/24 -CIWA protocol -Counseling once stable     Patient is critically  ill.  Prognosis is guarded, high risk for further decompensation, cardiac arrest and death.  Given chronic ETOH abuse, decompensated cirrhosis, and ongoing polysubstance abuse, overall long term prognosis is poor.  Recommend DNR status.   Best Practice (right click and "Reselect all SmartList Selections" daily)   Diet/type: NPO DVT prophylaxis: SCD GI prophylaxis: PPI Lines: Central line and yes and it is  still needed Foley:  N/A Code Status:  full code Last date of multidisciplinary goals of care discussion [8/24]  8/24: Pt updated at bedside on plan of care.  Labs   CBC: Recent Labs  Lab 08/08/23 1433 08/08/23 1917 08/09/23 0300 08/09/23 0817  WBC 4.6  --  9.9  --   HGB 10.4* 7.9* 6.8* 8.0*  HCT 31.5* 24.2* 19.4* 23.5*  MCV 93.2  --  89.8  --   PLT 113*  --  133*  --     Basic Metabolic Panel: Recent Labs  Lab 08/08/23 1433 08/09/23 0300  NA 134* 135  K 2.8* 4.3  CL 99 108  CO2 22 23  GLUCOSE 102* 126*  BUN 9 14  CREATININE 0.64 0.58*  CALCIUM 8.4* 6.8*  MG  --  1.4*  PHOS  --  2.0*   GFR: Estimated Creatinine Clearance: 110.6 mL/min (A) (by C-G formula based on SCr of 0.58 mg/dL (L)). Recent Labs  Lab 08/08/23 1433 08/08/23 2350 08/09/23 0300  WBC 4.6  --  9.9  LATICACIDVEN  --  2.3*  --     Liver Function Tests: Recent Labs  Lab 08/08/23 1433  AST 59*  ALT 20  ALKPHOS 219*  BILITOT 5.9*  PROT 7.4  ALBUMIN 3.1*   No results for input(s): "LIPASE", "AMYLASE" in the last 168 hours. No results for input(s): "AMMONIA" in the last 168 hours.  ABG    Component Value Date/Time   PHART 7.439 04/12/2023 0414   PCO2ART 37.6 04/12/2023 0414   PO2ART 80 (L) 04/12/2023 0414   HCO3 17.0 (L) 05/10/2023 0657   TCO2 27 04/12/2023 0414   ACIDBASEDEF 5.4 (H) 05/10/2023 0657   O2SAT 94.4 05/10/2023 0657     Coagulation Profile: Recent Labs  Lab 08/08/23 1952  INR 1.7*    Cardiac Enzymes: No results for input(s): "CKTOTAL", "CKMB", "CKMBINDEX", "TROPONINI" in the last 168 hours.  HbA1C: Hgb A1c MFr Bld  Date/Time Value Ref Range Status  05/04/2023 07:47 PM 5.2 4.8 - 5.6 % Final    Comment:    (NOTE) Pre diabetes:          5.7%-6.4%  Diabetes:              >6.4%  Glycemic control for   <7.0% adults with diabetes   02/22/2023 04:50 AM 5.6 4.8 - 5.6 % Final    Comment:    (NOTE)         Prediabetes: 5.7 - 6.4         Diabetes: >6.4          Glycemic control for adults with diabetes: <7.0     CBG: No results for input(s): "GLUCAP" in the last 168 hours.  Review of Systems:   Positives in BOLD: Gen: Denies fever, chills, weight change, fatigue, night sweats HEENT: Denies blurred vision, double vision, hearing loss, tinnitus, sinus congestion, rhinorrhea, sore throat, neck stiffness, dysphagia PULM: Denies shortness of breath, cough, sputum production, hemoptysis, wheezing CV: Denies chest pain, edema, orthopnea, paroxysmal nocturnal dyspnea, palpitations GI: Denies abdominal pain, nausea, vomiting, diarrhea, hematochezia, melena,  constipation, change in bowel habits GU: Denies dysuria, hematuria, polyuria, oliguria, urethral discharge Endocrine: Denies hot or cold intolerance, polyuria, polyphagia or appetite change Derm: Denies rash, dry skin, scaling or peeling skin change Heme: Denies easy bruising, bleeding, bleeding gums Neuro: Denies headache, numbness, weakness, slurred speech, loss of memory or consciousness   Past Medical History:  He,  has a past medical history of CAD (coronary artery disease), Cardiac arrest - ventricular fibrillation, Chronic pain, Cocaine abuse (HCC), Depression with anxiety, ETOH abuse, GERD (gastroesophageal reflux disease), GI bleed, Hemorrhoids, Hyperlipidemia, Hypertension, Ischemic cardiomyopathy, Marijuana abuse, Migraines, and Tobacco abuse.   Surgical History:   Past Surgical History:  Procedure Laterality Date   COLONOSCOPY  08/09/2021   Procedure: COLONOSCOPY;  Surgeon: Midge Minium, MD;  Location: [facility] ENDOSCOPY;  Service: Endoscopy;;   CORONARY ANGIOPLASTY WITH STENT PLACEMENT     ESOPHAGEAL BANDING  04/08/2023   Procedure: ESOPHAGEAL BANDING;  Surgeon: Tressia Danas, MD;  Location: [facility] ENDOSCOPY;  Service: Gastroenterology;;   ESOPHAGOGASTRODUODENOSCOPY N/A 05/30/2022   Procedure: ESOPHAGOGASTRODUODENOSCOPY (EGD);  Surgeon: Toney Reil, MD;  Location: [facility]  ENDOSCOPY;  Service: Gastroenterology;  Laterality: N/A;   ESOPHAGOGASTRODUODENOSCOPY (EGD) WITH PROPOFOL N/A 03/01/2021   Procedure: ESOPHAGOGASTRODUODENOSCOPY (EGD) WITH PROPOFOL;  Surgeon: Toney Reil, MD;  Location: [facility];  Service: Endoscopy;  Laterality: N/A;   ESOPHAGOGASTRODUODENOSCOPY (EGD) WITH PROPOFOL N/A 08/09/2021   Procedure: ESOPHAGOGASTRODUODENOSCOPY (EGD) WITH PROPOFOL;  Surgeon: Midge Minium, MD;  Location: [facility] ENDOSCOPY;  Service: Endoscopy;  Laterality: N/A;   ESOPHAGOGASTRODUODENOSCOPY (EGD) WITH PROPOFOL N/A 03/01/2023   Procedure: ESOPHAGOGASTRODUODENOSCOPY (EGD) WITH PROPOFOL;  Surgeon: Jaynie Collins, DO;  Location: [facility] ENDOSCOPY;  Service: Gastroenterology;  Laterality: N/A;   ESOPHAGOGASTRODUODENOSCOPY (EGD) WITH PROPOFOL N/A 04/08/2023   Procedure: ESOPHAGOGASTRODUODENOSCOPY (EGD) WITH PROPOFOL;  Surgeon: Tressia Danas, MD;  Location: [facility] ENDOSCOPY;  Service: Gastroenterology;  Laterality: N/A;   EVALUATION UNDER ANESTHESIA WITH HEMORRHOIDECTOMY N/A 10/31/2022   Procedure: EXAM UNDER ANESTHESIA WITH SUTURE LIGATION OF TWO BLEEDING PEDICLES;  Surgeon: Henrene Dodge, MD;  Location: [facility] ORS;  Service: General;  Laterality: N/A;   FLEXIBLE SIGMOIDOSCOPY N/A 10/02/2022   Procedure: FLEXIBLE SIGMOIDOSCOPY;  Surgeon: Meryl Dare, MD;  Location: [facility] ENDOSCOPY;  Service: Gastroenterology;  Laterality: N/A;   IR TIPS  04/11/2023   IR US GUIDE VASC ACCESS RIGHT  04/11/2023   IR US GUIDE VASC ACCESS RIGHT  04/11/2023   IR US GUIDE VASC ACCESS RIGHT  04/11/2023   LEFT HEART CATH AND CORONARY ANGIOGRAPHY N/A 11/04/2022   Procedure: LEFT HEART CATH AND CORONARY ANGIOGRAPHY;  Surgeon: Iran Ouch, MD;  Location: [facility] INVASIVE CV LAB;  Service: Cardiovascular;  Laterality: N/A;   RADIOLOGY WITH ANESTHESIA N/A 04/11/2023   Procedure: IR WITH ANESTHESIA;  Surgeon: Oley Balm, MD;  Location[facility] OR;  Service: Radiology;  Laterality: N/A;    VISCERAL ANGIOGRAPHY N/A 10/24/2022   Procedure: VISCERAL ANGIOGRAPHY;  Surgeon: Annice Needy, MD;  Location: [facility] INVASIVE CV LAB;  Service: Cardiovascular;  Laterality: N/A;   VISCERAL ARTERY INTERVENTION N/A 05/09/2023   Procedure: VISCERAL ARTERY INTERVENTION;  Surgeon: Renford Dills, MD;  Location: [facility] INVASIVE CV LAB;  Service: Cardiovascular;  Laterality: N/A;     Social History:   reports that he has been smoking cigarettes. He has a 20.5 pack-year smoking history. He has never used smokeless tobacco. He reports current alcohol use of about 12.0 standard drinks of alcohol per week. He reports current drug use. Drugs: Cocaine and Marijuana.   Family History:  His family history includes Alcohol abuse in his father; Coronary artery disease in his mother; Other in his paternal grandmother and another family member.   Allergies Allergies  Allergen Reactions   Penicillins Hives and Rash    Tolerated Ceftriaxone 10/2022     Home Medications  Prior to Admission medications   Medication Sig Start Date End Date Taking? Authorizing Provider  gabapentin (NEURONTIN) 100 MG capsule Take 1 capsule (100 mg total) by mouth 2 (two) times daily. 07/05/23 08/08/23 Yes Wieting, Richard, MD  methocarbamol (ROBAXIN) 750 MG tablet TAKE 1 TABLET (750 MG TOTAL) BY MOUTH 2 (TWO) TIMES DAILY AS NEEDED FOR MUSCLE SPASMS. 07/28/23  Yes Medina-Vargas, Monina C, NP  oxyCODONE (OXY IR/ROXICODONE) 5 MG immediate release tablet Take 1 tablet (5 mg total) by mouth every 6 (six) hours as needed for moderate pain. 07/05/23  Yes Wieting, Richard, MD  pantoprazole (PROTONIX) 40 MG tablet Take 1 tablet (40 mg total) by mouth 2 (two) times daily. 07/05/23 08/08/23 Yes Wieting, Richard, MD  QUEtiapine (SEROQUEL) 50 MG tablet Take 1 tablet (50 mg total) by mouth at bedtime. 07/05/23 08/08/23 Yes Wieting, Richard, MD  rosuvastatin (CRESTOR) 40 MG tablet TAKE 1 TABLET BY MOUTH EVERY DAY 07/28/23  Yes Medina-Vargas, Monina C, NP   clopidogrel (PLAVIX) 75 MG tablet Take 1 tablet (75 mg total) by mouth daily. Patient not taking: Reported on 07/08/2023 07/05/23   Alford Highland, MD  folic acid (FOLVITE) 1 MG tablet Take 1 tablet (1 mg total) by mouth daily. Patient not taking: Reported on 07/08/2023 07/05/23   Alford Highland, MD  LORazepam (ATIVAN) 0.5 MG tablet One tab po three times a day for two days then one tab twice a day for two days then one tab po daily for three days Patient not taking: Reported on 07/08/2023 07/05/23   Alford Highland, MD  Multiple Vitamin (MULTIVITAMIN WITH MINERALS) TABS tablet Take 1 tablet by mouth daily. Patient not taking: Reported on 07/08/2023 07/05/23   Alford Highland, MD  nicotine (NICODERM CQ - DOSED IN MG/24 HOURS) 21 mg/24hr patch One 21 mg patch chest wall daily (okay to substitute generic) Patient not taking: Reported on 07/08/2023 07/05/23   Alford Highland, MD  nitroGLYCERIN (NITROSTAT) 0.4 MG SL tablet Place 1 tablet (0.4 mg total) under the tongue every 5 (five) minutes as needed for chest pain. Patient not taking: Reported on 07/08/2023 07/05/23   Alford Highland, MD  ondansetron (ZOFRAN) 4 MG tablet Take 1 tablet (4 mg total) by mouth every 8 (eight) hours as needed for nausea or vomiting. Patient not taking: Reported on 07/08/2023 07/05/23   Alford Highland, MD  rifaximin (XIFAXAN) 550 MG TABS tablet Take 1 tablet (550 mg total) by mouth 2 (two) times daily. Patient not taking: Reported on 07/08/2023 07/05/23   Alford Highland, MD  thiamine (VITAMIN B1) 100 MG tablet Take 1 tablet (100 mg total) by mouth daily. Patient not taking: Reported on 07/08/2023 07/05/23   Alford Highland, MD     Critical care time: 40 minutes     Harlon Ditty, AGACNP-BC [facility] Prefer epic messenger for cross cover needs If after hours, please call E-link

## 2023-08-09 NOTE — Op Note (Signed)
[facility] Gastroenterology Patient Name: Ethan Wells Procedure Date: 08/09/2023 12:29 PM MRN: [id number] Account #: [id number] Date of Birth: [birth year] Admit Type: Inpatient Age: 52 Room: [facility] ENDO ROOM 4 Gender: Male Note Status: Finalized Instrument Name: Upper Endoscope [id number] Procedure:             Upper GI endoscopy Indications:           Hematochezia, Abnormal CT of the GI tract Providers:             Eather Colas MD, MD Referring MD:          Otilio Connors Medicines:             Monitored Anesthesia Care Complications:         No immediate complications. Procedure:             Pre-Anesthesia Assessment:                        - Prior to the procedure, a History and Physical was                         performed, and patient medications and allergies were                         reviewed. The patient is competent. The risks and                         benefits of the procedure and the sedation options and                         risks were discussed with the patient. All questions                         were answered and informed consent was obtained.                         Patient identification and proposed procedure were                         verified by the physician, the nurse, the                         anesthesiologist, the anesthetist and the technician                         in the endoscopy suite. Mental Status Examination:                         alert and oriented. Airway Examination: normal                         oropharyngeal airway and neck mobility. Respiratory                         Examination: clear to auscultation. CV Examination:                         normal. Prophylactic Antibiotics: The patient does not  require prophylactic antibiotics. Prior                         Anticoagulants: The patient has taken no anticoagulant                         or antiplatelet agents. ASA Grade  Assessment: E -                         Emergency. After reviewing the risks and benefits, the                         patient was deemed in satisfactory condition to                         undergo the procedure. The anesthesia plan was to use                         monitored anesthesia care (MAC). Immediately prior to                         administration of medications, the patient was                         re-assessed for adequacy to receive sedatives. The                         heart rate, respiratory rate, oxygen saturations,                         blood pressure, adequacy of pulmonary ventilation, and                         response to care were monitored throughout the                         procedure. The physical status of the patient was                         re-assessed after the procedure.                        After obtaining informed consent, the endoscope was                         passed under direct vision. Throughout the procedure,                         the patient's blood pressure, pulse, and oxygen                         saturations were monitored continuously. The Endoscope                         was introduced through the mouth, and advanced to the                         second part of duodenum. The upper GI endoscopy was  accomplished without difficulty. The patient tolerated                         the procedure well. Findings:      There were esophageal mucosal changes secondary to established       short-segment Barrett's disease present in the lower third of the       esophagus. This was not biopsied due to focusing on potential ulcer is       duodenal bulb.      There is no endoscopic evidence of varices in the entire esophagus.      The entire examined stomach was normal.      Few non-bleeding cratered duodenal ulcers with clean bases and one with       a flat pigmented spot (Forrest Class IIc) were found in the  duodenal       bulb. The largest lesion was 4 mm in largest dimension. Purastat was       applied to the ulcer with the flat pigmented spot in case it happened to       be a vessel in the process of healing. Impression:            - Esophageal mucosal changes secondary to established                         short-segment Barrett's disease.                        - Normal stomach.                        - Non-bleeding duodenal ulcers with a flat pigmented                         spot (Forrest Class IIc).                        - No specimens collected. Recommendation:        - Return patient to ICU for ongoing care.                        - Clear liquid diet today.                        - Use Protonix (pantoprazole) 40 mg IV BID for total                         of 3 days.                        - Octreotide can be discontinued. Continue ceftriaxone                         while in the hospital and then can switch to oral                         third generation cephalosporin at discharge to                         complete total of 7 days of antibiotics. Continue to  monitor for recurrent GI bleeding as he does have a                         history of significant hemorrhoidal bleeding. Check H                         pylori serologies and treat if positive. Avoid all                         NSAIDS including Goodies. Procedure Code(s):     --- Professional ---                        [phone], Esophagogastroduodenoscopy, flexible,                         transoral; diagnostic, including collection of                         specimen(s) by brushing or washing, when performed                         (separate procedure) Diagnosis Code(s):     --- Professional ---                        K22.70, Barrett's esophagus without dysplasia                        K26.9, Duodenal ulcer, unspecified as acute or                         chronic, without hemorrhage or perforation                         K92.1, Melena (includes Hematochezia)                        R93.3, Abnormal findings on diagnostic imaging of                         other parts of digestive tract CPT copyright 2022 [employer]. All rights reserved. The codes documented in this report are preliminary and upon coder review may  be revised to meet current compliance requirements. Eather Colas MD, MD 08/09/2023 1:29:26 PM Number of Addenda: 0 Note Initiated On: 08/09/2023 12:29 PM Estimated Blood Loss:  Estimated blood loss: none.      [facility]

## 2023-08-09 NOTE — Care Plan (Signed)
EGD showed multiple ulcers in the duodenal bulb that were clean based but one did have a flat pigmented spot vs possible healing vessel? Regardless, it was treated with purastat given questionable patient history of what exact medications he was taking, he wasn't sure about plavix.  - ok to advance to clear liquds - continue IV BID PPI - can discontinue octreotide - continue antibiotics - monitor for recurrent bleeding - check h pylori serologies and treat if positive - avoid all NSAIDS - continue to hold plavix - of note, it appears his esophageal varices have been deflated with the TIPS - we will continue to follow, please call with any questions or concerns  Merlyn Lot MD, MPH [facility]

## 2023-08-09 NOTE — Progress Notes (Signed)
Spoke with Dr. Belia Heman and Elvina Sidle, NP and discussed vasopressors. Dr. Belia Heman gave order to wean levophed drip off first, Elvina Sidle, NP gave order to infuse vasopressin at 0.04 unit/min.

## 2023-08-10 ENCOUNTER — Encounter: Payer: Self-pay | Admitting: *Deleted

## 2023-08-10 DIAGNOSIS — K264 Chronic or unspecified duodenal ulcer with hemorrhage: Principal | ICD-10-CM

## 2023-08-10 DIAGNOSIS — D62 Acute posthemorrhagic anemia: Secondary | ICD-10-CM | POA: Diagnosis not present

## 2023-08-10 LAB — CBC
HCT: 22.1 % — ABNORMAL LOW (ref 39.0–52.0)
Hemoglobin: 7.6 g/dL — ABNORMAL LOW (ref 13.0–17.0)
MCH: 30.9 pg (ref 26.0–34.0)
MCHC: 34.4 g/dL (ref 30.0–36.0)
MCV: 89.8 fL (ref 80.0–100.0)
Platelets: 91 10*3/uL — ABNORMAL LOW (ref 150–400)
RBC: 2.46 MIL/uL — ABNORMAL LOW (ref 4.22–5.81)
RDW: 17.4 % — ABNORMAL HIGH (ref 11.5–15.5)
WBC: 8.4 10*3/uL (ref 4.0–10.5)
nRBC: 0 % (ref 0.0–0.2)

## 2023-08-10 LAB — TYPE AND SCREEN
ABO/RH(D): A POS
Antibody Screen: NEGATIVE
Unit division: 0
Unit division: 0
Unit division: 0
Unit division: 0
Unit division: 0

## 2023-08-10 LAB — RENAL FUNCTION PANEL
Albumin: 2.2 g/dL — ABNORMAL LOW (ref 3.5–5.0)
Anion gap: 4 — ABNORMAL LOW (ref 5–15)
BUN: 13 mg/dL (ref 6–20)
CO2: 20 mmol/L — ABNORMAL LOW (ref 22–32)
Calcium: 7.1 mg/dL — ABNORMAL LOW (ref 8.9–10.3)
Chloride: 109 mmol/L (ref 98–111)
Creatinine, Ser: 0.57 mg/dL — ABNORMAL LOW (ref 0.61–1.24)
GFR, Estimated: >60 mL/min (ref >60)
Glucose, Bld: 156 mg/dL — ABNORMAL HIGH (ref 70–99)
Phosphorus: 1.7 mg/dL — ABNORMAL LOW (ref 2.5–4.6)
Potassium: 3.5 mmol/L (ref 3.5–5.1)
Sodium: 133 mmol/L — ABNORMAL LOW (ref 135–145)

## 2023-08-10 LAB — BPAM RBC
Blood Product Expiration Date: 202409062359
Blood Product Expiration Date: 202409062359
Blood Product Expiration Date: 202409162359
Blood Product Expiration Date: 202409162359
Blood Product Expiration Date: 202409162359
ISSUE DATE / TIME: 202408240157
ISSUE DATE / TIME: 202408240157
ISSUE DATE / TIME: 202408240344
ISSUE DATE / TIME: 202408241143
ISSUE DATE / TIME: 202408242159
Unit Type and Rh: 5100
Unit Type and Rh: 5100
Unit Type and Rh: 6200
Unit Type and Rh: 6200
Unit Type and Rh: 6200

## 2023-08-10 LAB — PREPARE RBC (CROSSMATCH)

## 2023-08-10 LAB — PREPARE FRESH FROZEN PLASMA: Unit division: 0

## 2023-08-10 LAB — HEMOGLOBIN AND HEMATOCRIT, BLOOD
HCT: 22 % — ABNORMAL LOW (ref 39.0–52.0)
HCT: 23.3 % — ABNORMAL LOW (ref 39.0–52.0)
Hemoglobin: 7.6 g/dL — ABNORMAL LOW (ref 13.0–17.0)
Hemoglobin: 8 g/dL — ABNORMAL LOW (ref 13.0–17.0)

## 2023-08-10 LAB — BPAM FFP
Blood Product Expiration Date: 202408292359
ISSUE DATE / TIME: 202408240954
Unit Type and Rh: 6200

## 2023-08-10 LAB — PROTIME-INR
INR: 1.6 — ABNORMAL HIGH (ref 0.8–1.2)
Prothrombin Time: 19.6 s — ABNORMAL HIGH (ref 11.4–15.2)

## 2023-08-10 LAB — LACTIC ACID, PLASMA: Lactic Acid, Venous: 3.1 mmol/L (ref 0.5–1.9)

## 2023-08-10 LAB — MAGNESIUM: Magnesium: 2.2 mg/dL (ref 1.7–2.4)

## 2023-08-10 MED ORDER — HYDROCORTISONE 5 MG PO TABS
15.0000 mg | ORAL_TABLET | Freq: Every day | ORAL | Status: DC
  Filled 2023-08-10: qty 1

## 2023-08-10 MED ORDER — QUETIAPINE FUMARATE 25 MG PO TABS
50.0000 mg | ORAL_TABLET | Freq: Every day | ORAL | Status: DC
  Administered 2023-08-10 – 2023-08-24 (×15): 50 mg via ORAL
  Filled 2023-08-10 (×15): qty 2

## 2023-08-10 MED ORDER — NICOTINE 21 MG/24HR TD PT24
21.0000 mg | MEDICATED_PATCH | Freq: Every day | TRANSDERMAL | Status: DC
  Administered 2023-08-10 – 2023-08-25 (×15): 21 mg via TRANSDERMAL
  Filled 2023-08-10 (×16): qty 1

## 2023-08-10 MED ORDER — METHOCARBAMOL 500 MG PO TABS
750.0000 mg | ORAL_TABLET | Freq: Two times a day (BID) | ORAL | Status: DC | PRN
  Administered 2023-08-10 – 2023-08-25 (×21): 750 mg via ORAL
  Filled 2023-08-10 (×5): qty 2
  Filled 2023-08-10: qty 1
  Filled 2023-08-10: qty 2
  Filled 2023-08-10: qty 1
  Filled 2023-08-10 (×13): qty 2
  Filled 2023-08-10: qty 1
  Filled 2023-08-10 (×3): qty 2
  Filled 2023-08-10: qty 1

## 2023-08-10 MED ORDER — RIFAXIMIN 550 MG PO TABS
550.0000 mg | ORAL_TABLET | Freq: Two times a day (BID) | ORAL | Status: DC
  Administered 2023-08-10 – 2023-08-25 (×30): 550 mg via ORAL
  Filled 2023-08-10 (×31): qty 1

## 2023-08-10 MED ORDER — CHLORDIAZEPOXIDE HCL 10 MG PO CAPS
10.0000 mg | ORAL_CAPSULE | Freq: Two times a day (BID) | ORAL | Status: DC
  Administered 2023-08-11: 10 mg via ORAL
  Filled 2023-08-10: qty 1

## 2023-08-10 MED ORDER — GABAPENTIN 100 MG PO CAPS
100.0000 mg | ORAL_CAPSULE | Freq: Two times a day (BID) | ORAL | Status: DC
  Administered 2023-08-10 – 2023-08-25 (×31): 100 mg via ORAL
  Filled 2023-08-10 (×31): qty 1

## 2023-08-10 MED ORDER — SODIUM PHOSPHATES 45 MMOLE/15ML IV SOLN
30.0000 mmol | Freq: Once | INTRAVENOUS | Status: AC
  Administered 2023-08-10: 30 mmol via INTRAVENOUS
  Filled 2023-08-10: qty 10

## 2023-08-10 MED ORDER — OXYCODONE HCL 5 MG PO TABS
5.0000 mg | ORAL_TABLET | Freq: Four times a day (QID) | ORAL | Status: DC | PRN
  Administered 2023-08-10 – 2023-08-16 (×16): 5 mg via ORAL
  Filled 2023-08-10 (×17): qty 1

## 2023-08-10 NOTE — Progress Notes (Signed)
Progress Note   [name] BJY:[id number] DOB: 12-20-1970 DOA: 08/08/2023     2 DOS: the patient was seen and examined on 08/10/2023   Brief hospital course: 52 y.o. gentleman with PMHx alcohol abuse, alcoholic liver cirrhosis with portal gastropathy, s/p TIPS, superior mesenteric artery stenosis s/p stenting, HTN, HLD, CAD, STEMI, dCHF, GERD, depression with anxiety, adrenal insufficiency (Addison's disease), migraine headache, cardiac arrest due to V-fib, recurrent GI bleeding, cocaine abuse, tobacco abuse who was admitted to ICU on evening of 08/07/2023 with Hemorrhagic shock due to Acute GI bleed, requiring pressors for BP support.    See ICU progress note of 08/09/23 for detailed HPI initial hospital course.  In summary, Patient required transfusion of 2 units pRBC's.  Gastroenterology consulted, took patient for EGD where non-bleeding duodenal ulcers were seen, one area treated with Purastat for concern of a healing, recently bleeding vessel.  TRH assumed care 08/10/2023.  Patient's BP soft but stable since weaned off pressors 8/24.  Transferred to med/tele floor.     Assessment and Plan:  #Hypovolemic/Hemorrhagic Shock PMHx: HFpEF, CAD, HTN, HLD -Continuous cardiac monitoring -Maintain MAP >65 -IV fluids -Transfusions as indicated -Vasopressors as needed to maintain MAP goal -Continue Stress dose steroids due to hx of adrenal insufficiency -Trend lactic acid until normalized -Trend HS Troponin until peaked -Diuresis as BP and renal function permits ~ holding due to shock   #Acute blood loss anemia in the setting of acute GI bleed #Thrombocytopenia PMHx: Alcoholic Cirrhosis s/p TIPS 4/27, esophageal varices,  CT Angio GI Bleed  showed 1. Active GI bleed in the region of the gastric antrum/pylorus and proximal duodenum.2. Cirrhosis with patent appearing TIPS. 3. Other non-acute findings as above. Discussed abnormal findings with on call GI Dr. Mia Creek and IR oncall  Dr. Fredia Sorrow who both reviewed and agreed does not look like a variceal bleed by CTA. There is thickening of the distal stomach and proximal duodenum and the focal bleed is in the duodenal bulb. Likely a bleeding ulcer. TIPS is widely patent by CT. Recommend that patient be stabilized with consideration of endoscopy after some resuscitation.  --GI following, appreciate recommendations --Duodenal ulcers seen on EGD 8/24 --s/p 2 units RBC's, 1 unit FFP in ICU --Trend Hbg, transfuse if < 7.0 --Monitor closely for bleeding signs --Hold off Plavix --SCD's for VTE Prophylaxis  --Off Octreotide drip --IV PPI BID --IV Rocephin to complete 7 days   #Hx cirrhosis 2/2 EtOH #Portal gastropathy S/p TIPS 4/27 --GI following, appreciate recommendations --Continue IV Rocephin to complete 7 days --Rifaximin started (Lactulose only on d/c) --Quickly taper off Librium given hepatic metabolism   #Non Anion Gap Metabolic Acidosis due Lactic Acidosis #Hypokalemia --Monitor BMP, Mg, phos levels --Trend lactic acid acid --K 3.5, replace as needed --Mgmt of underlying issues as outlined   #Hx adrenal insufficiency  --started on IV Solu-Cortef in ICU - dc'd --monitor Bp's closely, escalate steroid if needed --appears not taking steroids outpatient, per med history (? Supposed to take Cortef 15 mg daily.  I see 10 mg daily prescribed back in May) --follow up pending AM cortisol level --normal TSH, Free T4 --continue PO Cortef 15 mg daily for now    #Hx polysubstance abuse including EtOH #High risk for development of Alcohol withdrawal -UDS+Cocaine, Last Drink on 08/06/23 --continue thiamine and folate --ICU team started Librium 10 mg TID on 8/24, will quickly taper off given hepatic clearance, reduced to BID --No significant withdrawal seen by CIWA scores --Continue to monitor closely and  resume CIWA with PRN Ativan if needed            Subjective: Pt seen in ICU this AM.  Reports feeling okay.   Requests pain medication for his chronic back and leg pain, would like home meds restarted.  Denies abdominal pain. Asks about advancing diet.  No other acute complaints.   Physical Exam: Vitals:   08/10/23 1242 08/10/23 1300 08/10/23 1404 08/10/23 1609  BP: 100/75 (!) 94/48 (!) 94/48 (!) 95/53  Pulse: 64 87 65 86  Resp: 17 19 16 17   Temp:    97.7 F (36.5 C)  TempSrc:    Oral  SpO2: 98% 99% 97% 100%  Weight:      Height:       General exam: awake, alert, no acute distress, chronically ill appearing HEENT: moist mucus membranes, hearing grossly normal  Respiratory system: CTAB, no wheezes, rales or rhonchi, normal respiratory effort. Cardiovascular system: normal S1/S2, RRR, mild pedal edema.   Gastrointestinal system: soft, NT, ND, no HSM felt, +bowel sounds. Central nervous system: A&O x. no gross focal neurologic deficits, normal speech Skin: dry, intact, normal temperature, not signicantly pale appearing Psychiatry: normal mood, flat affect, judgement and insight appear normal   Data Reviewed:  Notable labs ---  Hbg 7.0 >> 7.6 >> 8.0 Na 133, bicarb 20, glucose 156, Cr 0.57, Ca 7.1 Phos 1.7 Albumin 2.2 Last lactic acid 3.6 -- repeat pending INR 1.6  Family Communication: none present.   Disposition: Status is: Inpatient Remains inpatient appropriate because: severity of illness as above, on IV therapies   Planned Discharge Destination: Home    Time spent: 46 minutes  Author: Pennie Banter, DO 08/10/2023 7:23 PM  For on call review [email or web address].

## 2023-08-10 NOTE — Hospital Course (Addendum)
53 y.o. gentleman with PMHx alcohol abuse, alcoholic liver cirrhosis with portal gastropathy, s/p TIPS, superior mesenteric artery stenosis s/p stenting, HTN, HLD, CAD, STEMI, dCHF, GERD, depression with anxiety, adrenal insufficiency (Addison's disease), migraine headache, cardiac arrest due to V-fib, recurrent GI bleeding, cocaine abuse, tobacco abuse who was admitted to ICU on evening of 08/07/2023 with Hemorrhagic shock due to Acute GI bleed, requiring pressors for BP support.    See ICU progress note of 08/09/23 for detailed HPI initial hospital course.  In summary, Patient required transfusion of 2 units pRBC's.  Gastroenterology consulted, took patient for EGD where non-bleeding duodenal ulcers were seen, one area treated with Purastat for concern of a healing, recently bleeding vessel.  TRH assumed care 08/10/2023.  Patient's BP soft but stable since weaned off pressors 8/24.  Transferred to med/tele floor.  Further hospital course and management as outlined below.  In summary, hospital course complicated by ongoing hypotension, acute withdrawal syndrome with encephalopathy.

## 2023-08-10 NOTE — Progress Notes (Signed)
Patient transferred to room 230 by bed with Thayer Ohm, NT. Patient alert with no distress noted when leaving ICU.

## 2023-08-10 NOTE — Progress Notes (Signed)
Asked Dr. Denton Lank about removing central line and MD stated to leave it in for now just in case. Patient has been getting q6H H&H.

## 2023-08-10 NOTE — Progress Notes (Signed)
GI Inpatient Follow-up Note  Subjective:  Patient seen and clinically doing well. States he had small bowel movement this morning but on discussing with nursing, night shift stated it was large and dark. Patient endorses improvement in abdominal pain and improvement in rectal pain.  Scheduled Inpatient Medications:   sodium chloride   Intravenous Once   chlordiazePOXIDE  10 mg Oral TID   Chlorhexidine Gluconate Cloth  6 each Topical Daily   folic acid  1 mg Oral Daily   gabapentin  100 mg Oral BID   midodrine  10 mg Oral TID WC   nicotine  21 mg Transdermal Daily   pantoprazole  40 mg Intravenous Q12H   QUEtiapine  50 mg Oral QHS   sodium chloride flush  10-40 mL Intracatheter Q12H   thiamine  100 mg Oral Daily    Continuous Inpatient Infusions:    cefTRIAXone (ROCEPHIN)  IV Stopped (08/10/23 0726)   sodium phosphate 30 mmol in dextrose 5 % 250 mL infusion 30 mmol (08/10/23 1003)    PRN Inpatient Medications:  acetaminophen, docusate sodium, methocarbamol, oxyCODONE, polyethylene glycol, sodium chloride flush  Review of Systems:  Review of Systems  Constitutional:  Negative for fever.  Respiratory:  Negative for shortness of breath.   Cardiovascular:  Negative for chest pain.  Gastrointestinal:  Positive for blood in stool. Negative for abdominal pain, nausea and vomiting.  Musculoskeletal:  Positive for joint pain.  Skin:  Negative for rash.  Neurological:  Negative for focal weakness.  Psychiatric/Behavioral:  Positive for substance abuse.   All other systems reviewed and are negative.     Physical Examination: BP (!) 94/48   Pulse 87   Temp 97.6 F (36.4 C) (Axillary)   Resp 19   Ht 5\' 6"  (1.676 m)   Wt 83.2 kg   SpO2 99%   BMI 29.61 kg/m  Gen: NAD, alert and oriented x 4 HEENT: PEERLA, EOMI, Neck: supple, no JVD or thyromegaly Chest: No respiratory distress Abd: soft, non-tender, non-distended Ext: no edema Skin: no rash or lesions noted Lymph: no  LAD  Data: Lab Results  Component Value Date   WBC 8.4 08/10/2023   HGB 7.6 (L) 08/10/2023   HCT 22.0 (L) 08/10/2023   MCV 89.8 08/10/2023   PLT 91 (L) 08/10/2023   Recent Labs  Lab 08/09/23 2010 08/10/23 0500 08/10/23 0909  HGB 7.0* 7.6* 7.6*   Lab Results  Component Value Date   NA 133 (L) 08/10/2023   K 3.5 08/10/2023   CL 109 08/10/2023   CO2 20 (L) 08/10/2023   BUN 13 08/10/2023   CREATININE 0.57 (L) 08/10/2023   Lab Results  Component Value Date   ALT 20 08/08/2023   AST 59 (H) 08/08/2023   ALKPHOS 219 (H) 08/08/2023   BILITOT 5.9 (H) 08/08/2023   Recent Labs  Lab 08/08/23 1952 08/10/23 0500  APTT 41*  --   INR 1.7* 1.6*   Assessment/Plan: Mr. Eidam is a 52 y.o. gentleman with history of alcoholic cirrhosis s/p TIPS due to esophageal variceal bleeding, HFpEF, CAD, adrenal insufficiency, polysubstance abuse including cocaine, and celiac/SMA stenosis here with rectal bleeding and found to have duodenal ulcers. Most of the ulcers were clean based except one with pigmented spot. By guidelines, pigmented spots do not need to be treated but given positive CTA, purastat was applied to the lesion. He did have dark bowel movement this morning which could just be old blood but will need to be monitored further.  Recommendations:  - agree with just full liquids for today - continue IV BID PPI through today and tomorrow - continue antibiotics to complete 7 day course - monitor for recurrent bleeding, I expect some old blood to still pass - avoid all NSAIDS - continue to hold plavix - ordered rifaximin given history of TIPS but will just need lactulose at discharge - maintain two large bore IV's - transfuse to keep hemoglobin > 7 - counseled on alcohol and drug cessation - please try to avoid any sedating medications such as opioids and benzos. I would also likely discontinue librium and just use a CIWA scale as librium is metabolized in the liver - we will  continue to follow, please call with any questions or concerns  Merlyn Lot MD, MPH [facility]

## 2023-08-10 NOTE — Progress Notes (Signed)
Pt has arrived onto unit. Pt ao x4, respirations even and unlabored on RA. Pt complaining of some pain, PRN given see MAR. Pt voiding without issue. Pt tolerating diet. Pt in bed and has no other questions or concerns at this time

## 2023-08-10 NOTE — Progress Notes (Signed)
Report called to Trinna Post, RN on 2C.

## 2023-08-10 NOTE — Consult Note (Signed)
PHARMACY CONSULT NOTE - ELECTROLYTES  Pharmacy Consult for Electrolyte Monitoring and Replacement   Recent Labs: Height: 5\' 6"  (167.6 cm) Weight: 83.2 kg (183 lb 6.8 oz) IBW/kg (Calculated) : 63.8 Estimated Creatinine Clearance: 110.6 mL/min (A) (by C-G formula based on SCr of 0.57 mg/dL (L)). Potassium (mmol/L)  Date Value  08/10/2023 3.5  09/06/2013 3.5   Magnesium (mg/dL)  Date Value  16/09/9603 2.2  03/09/2013 2.2   Calcium (mg/dL)  Date Value  54/08/8118 7.1 (L)   Calcium, Total (mg/dL)  Date Value  14/78/2956 8.9   Albumin (g/dL)  Date Value  21/30/8657 2.2 (L)  09/06/2013 4.0   Phosphorus (mg/dL)  Date Value  84/69/6295 1.7 (L)   Sodium (mmol/L)  Date Value  08/10/2023 133 (L)  09/06/2013 134 (L)    Assessment  Ethan Wells is a 52 y.o. male presenting with abdominal pain. PMH significant for alcoholic liver cirrhosis with portal gastropathy, s/p of TIPS, superior mesenteric artery stenosis, HTN, HLD, CAD, STEMI, dCHF, GERD, depression with anxiety, adrenal insufficiency (Addison's disease), migraine headache, cardiac arrest due to V-fib, GI bleed,cocaine abuse, tobacco abuse. Pharmacy has been consulted to monitor and replace electrolytes.  Goal of Therapy: Electrolytes WNL  Plan:  30 mmol iv sodium phosphate x 1 Check BMP, Mg, Phos with AM labs  Thank you for allowing pharmacy to be a part of this patient's care.  Burnis Medin, PharmD, BCPS 08/10/2023 7:05 AM

## 2023-08-11 ENCOUNTER — Other Ambulatory Visit: Payer: Self-pay | Admitting: *Deleted

## 2023-08-11 ENCOUNTER — Encounter: Payer: Self-pay | Admitting: Gastroenterology

## 2023-08-11 DIAGNOSIS — D62 Acute posthemorrhagic anemia: Secondary | ICD-10-CM | POA: Diagnosis not present

## 2023-08-11 DIAGNOSIS — G9341 Metabolic encephalopathy: Secondary | ICD-10-CM | POA: Insufficient documentation

## 2023-08-11 LAB — COMPREHENSIVE METABOLIC PANEL
ALT: 16 U/L (ref 0–44)
AST: 40 U/L (ref 15–41)
Albumin: 2.1 g/dL — ABNORMAL LOW (ref 3.5–5.0)
Alkaline Phosphatase: 100 U/L (ref 38–126)
Anion gap: 8 (ref 5–15)
BUN: 9 mg/dL (ref 6–20)
CO2: 21 mmol/L — ABNORMAL LOW (ref 22–32)
Calcium: 7.7 mg/dL — ABNORMAL LOW (ref 8.9–10.3)
Chloride: 111 mmol/L (ref 98–111)
Creatinine, Ser: 0.62 mg/dL (ref 0.61–1.24)
GFR, Estimated: >60 mL/min (ref >60)
Glucose, Bld: 133 mg/dL — ABNORMAL HIGH (ref 70–99)
Potassium: 3 mmol/L — ABNORMAL LOW (ref 3.5–5.1)
Sodium: 138 mmol/L (ref 135–145)
Total Bilirubin: 2.2 mg/dL — ABNORMAL HIGH (ref 0.3–1.2)
Total Protein: 4.5 g/dL — ABNORMAL LOW (ref 6.5–8.1)

## 2023-08-11 LAB — CBC
HCT: 20.8 % — ABNORMAL LOW (ref 39.0–52.0)
Hemoglobin: 7 g/dL — ABNORMAL LOW (ref 13.0–17.0)
MCH: 31.1 pg (ref 26.0–34.0)
MCHC: 33.7 g/dL (ref 30.0–36.0)
MCV: 92.4 fL (ref 80.0–100.0)
Platelets: 80 10*3/uL — ABNORMAL LOW (ref 150–400)
RBC: 2.25 MIL/uL — ABNORMAL LOW (ref 4.22–5.81)
RDW: 18.6 % — ABNORMAL HIGH (ref 11.5–15.5)
WBC: 5.1 10*3/uL (ref 4.0–10.5)
nRBC: 0 % (ref 0.0–0.2)

## 2023-08-11 LAB — CORTISOL-AM, BLOOD: Cortisol - AM: 1.9 ug/dL — ABNORMAL LOW (ref 6.7–22.6)

## 2023-08-11 LAB — PROTIME-INR
INR: 1.5 — ABNORMAL HIGH (ref 0.8–1.2)
Prothrombin Time: 18.4 s — ABNORMAL HIGH (ref 11.4–15.2)

## 2023-08-11 LAB — GLUCOSE, CAPILLARY
Glucose-Capillary: 101 mg/dL — ABNORMAL HIGH (ref 70–99)
Glucose-Capillary: 138 mg/dL — ABNORMAL HIGH (ref 70–99)

## 2023-08-11 LAB — HEMOGLOBIN AND HEMATOCRIT, BLOOD
HCT: 22.6 % — ABNORMAL LOW (ref 39.0–52.0)
Hemoglobin: 7.6 g/dL — ABNORMAL LOW (ref 13.0–17.0)

## 2023-08-11 LAB — PHOSPHORUS: Phosphorus: 1.9 mg/dL — ABNORMAL LOW (ref 2.5–4.6)

## 2023-08-11 LAB — LACTIC ACID, PLASMA: Lactic Acid, Venous: 1.3 mmol/L (ref 0.5–1.9)

## 2023-08-11 LAB — AMMONIA: Ammonia: 49 umol/L — ABNORMAL HIGH (ref 9–35)

## 2023-08-11 MED ORDER — LORAZEPAM 2 MG/ML IJ SOLN
1.0000 mg | INTRAMUSCULAR | Status: AC | PRN
  Administered 2023-08-11 – 2023-08-13 (×3): 2 mg via INTRAVENOUS
  Filled 2023-08-11 (×3): qty 1

## 2023-08-11 MED ORDER — SODIUM CHLORIDE 0.9 % IV BOLUS
500.0000 mL | Freq: Once | INTRAVENOUS | Status: AC
  Administered 2023-08-11: 500 mL via INTRAVENOUS

## 2023-08-11 MED ORDER — HYDROCORTISONE SOD SUC (PF) 100 MG IJ SOLR
50.0000 mg | Freq: Three times a day (TID) | INTRAMUSCULAR | Status: DC
  Administered 2023-08-11 – 2023-08-15 (×12): 50 mg via INTRAVENOUS
  Filled 2023-08-11: qty 2
  Filled 2023-08-11 (×2): qty 1
  Filled 2023-08-11: qty 2
  Filled 2023-08-11 (×10): qty 1
  Filled 2023-08-11: qty 2

## 2023-08-11 MED ORDER — LACTULOSE 10 GM/15ML PO SOLN
10.0000 g | Freq: Three times a day (TID) | ORAL | Status: DC
  Administered 2023-08-11 – 2023-08-12 (×2): 10 g via ORAL
  Filled 2023-08-11 (×2): qty 30

## 2023-08-11 MED ORDER — LORAZEPAM 1 MG PO TABS
1.0000 mg | ORAL_TABLET | ORAL | Status: AC | PRN
  Administered 2023-08-11 – 2023-08-12 (×3): 1 mg via ORAL
  Administered 2023-08-12 – 2023-08-13 (×2): 2 mg via ORAL
  Administered 2023-08-13: 1 mg via ORAL
  Filled 2023-08-11: qty 2
  Filled 2023-08-11 (×2): qty 1
  Filled 2023-08-11: qty 2
  Filled 2023-08-11 (×2): qty 1

## 2023-08-11 MED ORDER — POTASSIUM CHLORIDE CRYS ER 20 MEQ PO TBCR
40.0000 meq | EXTENDED_RELEASE_TABLET | ORAL | Status: AC
  Administered 2023-08-11 (×2): 40 meq via ORAL
  Filled 2023-08-11 (×2): qty 2

## 2023-08-11 MED ORDER — POTASSIUM PHOSPHATES 15 MMOLE/5ML IV SOLN
30.0000 mmol | Freq: Once | INTRAVENOUS | Status: AC
  Administered 2023-08-11: 30 mmol via INTRAVENOUS
  Filled 2023-08-11: qty 10

## 2023-08-11 MED ORDER — SODIUM CHLORIDE 0.9 % IV SOLN: INTRAVENOUS | Status: DC

## 2023-08-11 MED ORDER — HYDROCORTISONE 5 MG PO TABS
15.0000 mg | ORAL_TABLET | Freq: Every day | ORAL | Status: DC
  Administered 2023-08-11: 15 mg via ORAL
  Filled 2023-08-11: qty 1

## 2023-08-11 NOTE — Plan of Care (Signed)

## 2023-08-11 NOTE — TOC Initial Note (Signed)
Transition of Care [facility]) - Initial/Assessment Note    Patient Details  Name: Ethan Wells MRN: [id number] Date of Birth: March 04, 1971  [facility] CM/SW Contact:    Margarito Liner, LCSW Phone Number: 08/11/2023, 10:36 AM  Clinical Narrative: Readmission prevention screen complete. CSW met with patient. No supports at bedside. CSW introduced role and explained that discharge planning would be discussed. PCP is Kenard Gower, NP. His insurance company provides transportation to appointments. [facility] [place] [facility] [place] He has to go between the two because they do not carry the same medications that he needs. Patient moved out of his boarding house due to bed bugs. He has been sleeping in his truck outside of his mother's house. CSW offered shelter resources but declined because he said they won't accept him since he has income and needs DME to get around. He is working on getting an apartment now that his legal issues are resolved. No home health prior to admission. He has a RW and cane. No further concerns. CSW encouraged patient to contact CSW as needed. CSW will continue to follow patient for support and facilitate discharge once medically stable. Patient stated he will likely need a cab to his mother's house at discharge.               Expected Discharge Plan: Home/Self Care Barriers to Discharge: Continued Medical Work up   Patient Goals and CMS Choice            Expected Discharge Plan and Services     Post Acute Care Choice: NA Living arrangements for the past 2 months: Homeless                                      Prior Living Arrangements/Services Living arrangements for the past 2 months: Homeless Lives with:: Self Patient language and need for interpreter reviewed:: Yes Do you feel safe going back to the place where you live?: Yes      Need for Family Participation in Patient Care: Yes (Comment)    Current home services: DME Criminal Activity/Legal Involvement Pertinent to Current Situation/Hospitalization: No - Comment as needed  Activities of Daily Living Home Assistive Devices/Equipment: None ADL Screening (condition at time of admission) Patient's cognitive ability adequate to safely complete daily activities?: Yes Is the patient deaf or have difficulty hearing?: No Does the patient have difficulty seeing, even when wearing glasses/contacts?: No Does the patient have difficulty concentrating, remembering, or making decisions?: No Patient able to express need for assistance with ADLs?: Yes Does the patient have difficulty dressing or bathing?: No Independently performs ADLs?: Yes (appropriate for developmental age) Does the patient have difficulty walking or climbing stairs?: No Weakness of Legs: None Weakness of Arms/Hands: None  Permission Sought/Granted                  Emotional Assessment Appearance:: Appears stated age Attitude/Demeanor/Rapport: Engaged Affect (typically observed): Appropriate, Calm, Pleasant Orientation: : Oriented to Self, Oriented to Place, Oriented to  Time, Oriented to Situation Alcohol / Substance Use: Not Applicable Psych Involvement: No (comment)  Admission diagnosis:  GI bleeding [K92.2] Hypotension, unspecified hypotension type [I95.9] Gastrointestinal hemorrhage, unspecified gastrointestinal hemorrhage type [K92.2] Anemia, unspecified type [D64.9] Acute blood loss anemia (ABLA) [D62] Patient Active Problem List   Diagnosis Date Noted   Acute blood loss anemia (ABLA) 08/08/2023  Elevated liver function tests 07/02/2023   Pancytopenia (HCC) 07/01/2023   Metabolic acidosis 07/01/2023   Liver mass 06/30/2023   Nausea vomiting and diarrhea 06/30/2023   Migraine 06/30/2023   Cholecystitis, acute 05/10/2023   UTI (urinary tract infection) 05/05/2023   Chronic diastolic CHF (congestive heart failure) (HCC) 05/05/2023   Alcohol  abuse 05/05/2023   Alcoholic cirrhosis (HCC) 04/12/2023   Esophageal varices with bleeding in diseases classified elsewhere (HCC) 04/09/2023   Hepatic encephalopathy (HCC) 04/09/2023   Secondary esophageal varices with bleeding (HCC) 04/08/2023   Upper GI bleeding 04/07/2023   Leg edema 03/01/2023   GI bleeding 02/22/2023   HLD (hyperlipidemia) 02/22/2023   Adrenal insufficiency (HCC) 02/22/2023   Alcoholic liver disease (HCC) 02/22/2023   Upper GI bleed 02/22/2023   Myocardial injury 01/29/2023   Adrenal insufficiency (Addison's disease) (HCC) 01/28/2023   Reactive thrombocytosis 01/24/2023   Hypophosphatemia 01/22/2023   Acute hypoxic respiratory failure (HCC) 01/20/2023   Orthostatic hypotension 01/17/2023   Anasarca 01/17/2023   Obesity (BMI 30-39.9) 01/17/2023   Cocaine dependence with cocaine-induced disorder (HCC) 01/17/2023   Rectal bleeding 01/17/2023   Peripheral edema 01/17/2023   History of benzodiazepine use 01/17/2023   Chest pain 11/19/2022   Acute on chronic heart failure with preserved ejection fraction (HFpEF) (HCC) 10/29/2022   Acute respiratory distress 10/29/2022   Elevated rheumatoid factor 10/28/2022   Hyperkalemia 10/25/2022   Hemorrhoids, internal, with bleeding 10/25/2022   Mesenteric artery stenosis (HCC) 10/23/2022   Iron deficiency anemia 10/23/2022   Hematochezia    Abnormal CT scan, colon    Colitis 09/30/2022   Prolonged QT interval 09/30/2022   Alcohol use disorder 08/19/2022   GI bleed 05/28/2022   Non-intractable vomiting    Epigastric pain    Polyp of ascending colon    Anemia due to GI blood loss 08/07/2021   Acute gastritis    Hyperbilirubinemia    Elevated troponin    Notalgia    History of ST elevation myocardial infarction (STEMI) 12/19/2020   Alcohol dependence (HCC) 12/19/2020   Depression with anxiety 10/05/2020   Esophageal reflux 10/05/2020   Headache 10/05/2020   Hyperlipidemia 10/05/2020   Hyponatremia 06/04/2020    Hypomagnesemia 06/04/2020   Chronic systolic CHF (congestive heart failure) (HCC) 06/04/2020   Leukocytosis 06/04/2020   Abdominal pain 06/04/2020   Hypokalemia 06/04/2020   Ischemic cardiomyopathy    Polysubstance abuse (HCC)    NSTEMI (non-ST elevated myocardial infarction) (HCC) 07/12/2019   Abdominal pain, RUQ 05/25/2018   Chest pain, mid sternal 11/17/2012   Cocaine abuse (HCC)    Marijuana abuse    Tobacco abuse 02/26/2011   CAD (coronary artery disease) 02/26/2011   Other specified forms of chronic ischemic heart disease 02/26/2011   PCP:  [name], NP Pharmacy:   [facility] [address] [place] [facility] [address] [place] [address] Phone: [phone] Fax: [phone]  [facility] [address] [place] [address] Phone: [phone] Fax: [phone]  [id number] [place] [place] - [address] [place] [address] Phone: [phone] Fax: [phone]     Social Determinants of Health (SDOH) Social History: SDOH Screenings   Food Insecurity: No Food Insecurity (08/09/2023)  Housing: Patient Unable To Answer (08/09/2023)  Transportation Needs: No Transportation Needs (08/09/2023)  Utilities: Not At Risk (08/09/2023)  Alcohol Screen: Medium Risk (10/01/2022)  Tobacco Use: High Risk (08/10/2023)   SDOH Interventions:  Readmission Risk Interventions    08/11/2023   10:35 AM 02/24/2023    3:12 PM 01/17/2023    3:38 PM  Readmission Risk Prevention Plan  Transportation Screening Complete Complete Complete  Medication Review Oceanographer) Complete Complete Complete  PCP or Specialist appointment within 3-5 days of discharge Complete    SW Recovery Care/Counseling Consult Complete  Not Complete  SW Consult Not Complete Comments   lethargic  Palliative Care Screening Not Applicable Not Applicable Not Applicable  Skilled Nursing Facility Not Applicable Not  Applicable Not Applicable

## 2023-08-11 NOTE — Consult Note (Addendum)
[facility] Customer service manager [facility]) Accountable Care Organization (ACO) [facility] Liaison Note  08/11/2023  Ethan Wells 04-28-1971 [id number]  Location: [facility] RN Hospital Liaison met patient at bedside at [facility].  Insurance: [employer] H[employer]   Ethan Wells is a 52 y.o. male who is a Primary Care Patient of Medina-Vargas, Monina C, NP. The patient was screened for  readmission hospitalization with noted extreme risk score for unplanned readmission risk with 5 IP/1 ED in 6 months.  The patient was assessed for potential [facility]) Care Management service needs for post hospital transition for care coordination. Review of patient's electronic medical record reveals patient was admitted with GI bleed. Liaison attempted bedside visit however pt not feeling well with several staff members in the room. Based upon his risk for readmission liaison will make a referral for care coordinator and CSW for community resources and care management services.  Plan: [facility] Liaison will continue to follow progress and disposition to asess for post hospital community care coordination/management needs.  Referral request for community care coordination: pending disposition.   [facility] Care Management/Population Health does not replace or interfere with any arrangements made by the Inpatient Transition of Care team.   For questions contact:    Elliot Cousin, RN, [facility] Liaison [place]   Population Health Office Hours MTWF  8:00 am-6:00 pm [phone] mobile [phone] [Office toll free line] Office Hours are M-F 8:30 - 5 pm [email or web address]

## 2023-08-11 NOTE — Progress Notes (Signed)
GI Inpatient Follow-up Note  Subjective:  Patient seen and he was very somnolent. Per nursing, he had just gotten some ativan for agitation. He had a brown bowel movement today.  Scheduled Inpatient Medications:   sodium chloride   Intravenous Once   chlordiazePOXIDE  10 mg Oral BID   Chlorhexidine Gluconate Cloth  6 each Topical Daily   folic acid  1 mg Oral Daily   gabapentin  100 mg Oral BID   hydrocortisone sod succinate (SOLU-CORTEF) inj  50 mg Intravenous Q8H   midodrine  10 mg Oral TID WC   nicotine  21 mg Transdermal Daily   pantoprazole  40 mg Intravenous Q12H   QUEtiapine  50 mg Oral QHS   rifaximin  550 mg Oral BID   sodium chloride flush  10-40 mL Intracatheter Q12H   thiamine  100 mg Oral Daily    Continuous Inpatient Infusions:    sodium chloride 75 mL/hr at 08/11/23 1543   cefTRIAXone (ROCEPHIN)  IV Stopped (08/10/23 2319)    PRN Inpatient Medications:  acetaminophen, docusate sodium, LORazepam **OR** LORazepam, methocarbamol, oxyCODONE, polyethylene glycol, sodium chloride flush  Review of Systems:  Unable to adequately assess due to being sleepy.   Physical Examination: BP (!) 87/43   Pulse 77   Temp 97.9 F (36.6 C) (Oral)   Resp 16   Ht 5\' 6"  (1.676 m)   Wt 83.2 kg   SpO2 100%   BMI 29.61 kg/m  Gen: NAD, somnolent HEENT: PEERLA, EOMI, Neck: supple, no JVD or thyromegaly Chest: No respiratory distress Abd: soft, non-tender, non-distended Ext: no edema, well perfused with 2+ pulses, Skin: no rash or lesions noted Lymph: no LAD  Data: Lab Results  Component Value Date   WBC 5.1 08/11/2023   HGB 7.6 (L) 08/11/2023   HCT 22.6 (L) 08/11/2023   MCV 92.4 08/11/2023   PLT 80 (L) 08/11/2023   Recent Labs  Lab 08/10/23 1405 08/11/23 0430 08/11/23 1325  HGB 8.0* 7.0* 7.6*   Lab Results  Component Value Date   NA 138 08/11/2023   K 3.0 (L) 08/11/2023   CL 111 08/11/2023   CO2 21 (L) 08/11/2023   BUN 9 08/11/2023   CREATININE 0.62  08/11/2023   Lab Results  Component Value Date   ALT 16 08/11/2023   AST 40 08/11/2023   ALKPHOS 100 08/11/2023   BILITOT 2.2 (H) 08/11/2023   Recent Labs  Lab 08/08/23 1952 08/10/23 0500 08/11/23 0430  APTT 41*  --   --   INR 1.7*   < > 1.5*   < > = values in this interval not displayed.   Assessment/Plan: Ethan Wells is a 52 y.o. gentleman with history of alcoholic cirrhosis s/p TIPS due to esophageal variceal bleeding, HFpEF, CAD, adrenal insufficiency, polysubstance abuse including cocaine, and celiac/SMA stenosis here with rectal bleeding and found to have duodenal ulcers. Most of the ulcers were clean based except one with pigmented spot. By guidelines, pigmented spots do not need to be treated but given positive CTA, purastat was applied to the lesion. Had bowel movement today with stable hemoglobin  Recommendations:  - advance diet as tolerated - continue IV BID PPI, can switch to PO PPI tomorrow - continue antibiotics to complete 7 day course - monitor for recurrent bleeding - avoid all NSAIDS - continue to hold plavix - continue rifaximin - maintain two large bore IV's - transfuse to keep hemoglobin > 7 - please try to avoid any sedating medications such  as opioids and benzos. Appears he is somnolent due to benzo's - we will continue to follow, please call with any questions or concerns   Merlyn Lot MD, MPH [facility]

## 2023-08-11 NOTE — Care Management Important Message (Signed)
Important Message  Patient Details  Name: Ethan Wells MRN: [id number] Date of Birth: [birth year]   Medicare Important Message Given:  N/A - LOS <3 / Initial given by admissions     [name] 08/11/2023, 8:30 AM

## 2023-08-11 NOTE — Plan of Care (Signed)

## 2023-08-11 NOTE — Progress Notes (Addendum)
Progress Note   [name] ZOX:[id number] DOB: 11/20/1971 DOA: 08/08/2023     3 DOS: the patient was seen and examined on 08/11/2023   Brief hospital course: 52 y.o. gentleman with PMHx alcohol abuse, alcoholic liver cirrhosis with portal gastropathy, s/p TIPS, superior mesenteric artery stenosis s/p stenting, HTN, HLD, CAD, STEMI, dCHF, GERD, depression with anxiety, adrenal insufficiency (Addison's disease), migraine headache, cardiac arrest due to V-fib, recurrent GI bleeding, cocaine abuse, tobacco abuse who was admitted to ICU on evening of 08/07/2023 with Hemorrhagic shock due to Acute GI bleed, requiring pressors for BP support.    See ICU progress note of 08/09/23 for detailed HPI initial hospital course.  In summary, Patient required transfusion of 2 units pRBC's.  Gastroenterology consulted, took patient for EGD where non-bleeding duodenal ulcers were seen, one area treated with Purastat for concern of a healing, recently bleeding vessel.  TRH assumed care 08/10/2023.  Patient's BP soft but stable since weaned off pressors 8/24.  Transferred to med/tele floor.  Further hospital course and management as outlined below.  In summary, hospital course complicated by ongoing hypotension, acute withdrawal syndrome with encephalopathy.       Assessment and Plan:  #Hypovolemic/Hemorrhagic Shock PMHx: HFpEF, CAD, HTN, HLD 8/26 -- BP's low, given fluid bolus for MAP < 60 this afternoon --Telemetry --Transfusions as needed --IV fluids if needed for BP support  --Transfer to ICU if MAPs staying below 60 despite IV fluids midodrine and steroids --Continue stress dose IV Solu-Cortef for now --BP/MAP stable off vasopressors --Goal MAP > 60 --Trend lactic acid until normalized --Diuresis on hold with low BP's   #Acute blood loss anemia in the setting of acute GI bleed #Thrombocytopenia PMHx: Alcoholic Cirrhosis s/p TIPS 4/27, esophageal varices,  CT Angio GI Bleed  showed 1.  Active GI bleed in the region of the gastric antrum/pylorus and proximal duodenum.2. Cirrhosis with patent appearing TIPS. 3. Other non-acute findings as above. Discussed abnormal findings with on call GI Dr. Mia Creek and IR oncall Dr. Fredia Sorrow who both reviewed and agreed does not look like a variceal bleed by CTA. There is thickening of the distal stomach and proximal duodenum and the focal bleed is in the duodenal bulb. Likely a bleeding ulcer. TIPS is widely patent by CT. Recommend that patient be stabilized with consideration of endoscopy after some resuscitation.  --GI following, appreciate recommendations --Duodenal ulcers seen on EGD 8/24 --s/p 2 units RBC's, 1 unit FFP in ICU --Trend Hbg, transfuse if < 7.0 --Monitor closely for bleeding signs --Per GI - advance diet as tolerated --Hold off Plavix --SCD's for VTE Prophylaxis  --Off Octreotide drip --IV PPI BID --IV Rocephin to complete 7 days   #Acute Metabolic Encephalopathy -- due to  # Acute withdrawal syndrome # Hepatic Encephalopathy 8/26 pt developed progressive withdrawal symptoms and confusion. Ammonia 49.  CIWA score 15 this afternoon --Resumed on CIWA, PRN Ativan --Stop Librium given liver disease --Start lactulose 10 g PO TID --If escalating, transfer to stepdown  --Delirium precautions  #Hx cirrhosis 2/2 EtOH #Portal gastropathy S/p TIPS 4/27 --GI following, appreciate recommendations --Continue IV Rocephin to complete 7 days --Rifaximin started (Lactulose only on d/c) --Starting lactulose for hepatic enc as above   #Non Anion Gap Metabolic Acidosis due Lactic Acidosis #Hypokalemia #Hypophosphatemia --Monitor BMP, Mg, phos levels --Trend lactic acid acid (last 3.1) - on IV fluids --K 3.0, replacement ordered --Mgmt of underlying issues as outlined   #Hx adrenal insufficiency  --started on IV Solu-Cortef in ICU -  dc'd --monitor Bp's closely, escalate steroid if needed --appears not taking steroids  outpatient, per med history (? Supposed to take Cortef 15 mg daily.  I see 10 mg daily prescribed back in May) --AM cortisol 1.9 --normal TSH, Free T4 --on IV Solu-Cortef as above for now    #Hx polysubstance abuse including EtOH --See above for mgmt of withdrawal -UDS+Cocaine, Last Drink on 08/06/23 --continue thiamine and folate            Subjective: Pt seen sitting up in bed having breakfast this AM.  He asks if diet can be advanced.  Denies acute complaints other than chronic pain.  This afternoon, pt noted having panic-type anxiety, agitation, tremors, feeling unwell but pt unable to really characterize it.  He became more tachycardic.  Reports non-bloody BM today.     Physical Exam: Vitals:   08/11/23 0841 08/11/23 1356 08/11/23 1537 08/11/23 1716  BP: (!) 88/49 (!) 158/86 (!) 87/43 (!) 90/54  Pulse: 64 (!) 111 77 74  Resp:   16   Temp:   97.9 F (36.6 C)   TempSrc:   Oral   SpO2:  100% 100%   Weight:      Height:       General exam: awake, alert, no acute distress, chronically ill appearing HEENT: moist mucus membranes, hearing grossly normal  Respiratory system: CTAB, no wheezes, rales or rhonchi, normal respiratory effort. Cardiovascular system: normal S1/S2, RRR, mild pedal edema.   Gastrointestinal system: soft, NT, ND, no HSM felt, +bowel sounds. Central nervous system: A&O x. no gross focal neurologic deficits, normal speech Skin: dry, intact, normal temperature, not signicantly pale appearing Psychiatry: normal mood, flat affect, judgement and insight appear normal   Data Reviewed:  Notable labs ---  Hbg 7.0 >> 7.6 >> 8.0 Na 133, bicarb 20, glucose 156, Cr 0.57, Ca 7.1 Phos 1.7 Albumin 2.2 Last lactic acid 3.6 -- repeat pending INR 1.6  Family Communication: none present.   Disposition: Status is: Inpatient Remains inpatient appropriate because: severity of illness as above, on IV therapies   Planned Discharge Destination: Home    Time  spent: 46 minutes  Author: Pennie Banter, DO 08/11/2023 6:25 PM  For on call review [email or web address].

## 2023-08-12 ENCOUNTER — Inpatient Hospital Stay: Payer: Medicare HMO

## 2023-08-12 DIAGNOSIS — E2740 Unspecified adrenocortical insufficiency: Secondary | ICD-10-CM | POA: Diagnosis not present

## 2023-08-12 DIAGNOSIS — F10231 Alcohol dependence with withdrawal delirium: Secondary | ICD-10-CM | POA: Diagnosis not present

## 2023-08-12 DIAGNOSIS — K7682 Hepatic encephalopathy: Secondary | ICD-10-CM | POA: Diagnosis not present

## 2023-08-12 DIAGNOSIS — D62 Acute posthemorrhagic anemia: Secondary | ICD-10-CM | POA: Diagnosis not present

## 2023-08-12 DIAGNOSIS — R0600 Dyspnea, unspecified: Secondary | ICD-10-CM | POA: Diagnosis not present

## 2023-08-12 DIAGNOSIS — R079 Chest pain, unspecified: Secondary | ICD-10-CM | POA: Diagnosis not present

## 2023-08-12 DIAGNOSIS — Z452 Encounter for adjustment and management of vascular access device: Secondary | ICD-10-CM | POA: Diagnosis not present

## 2023-08-12 LAB — CBC
HCT: 22.2 % — ABNORMAL LOW (ref 39.0–52.0)
Hemoglobin: 7.7 g/dL — ABNORMAL LOW (ref 13.0–17.0)
MCH: 32.2 pg (ref 26.0–34.0)
MCHC: 34.7 g/dL (ref 30.0–36.0)
MCV: 92.9 fL (ref 80.0–100.0)
Platelets: 82 10*3/uL — ABNORMAL LOW (ref 150–400)
RBC: 2.39 MIL/uL — ABNORMAL LOW (ref 4.22–5.81)
RDW: 19.2 % — ABNORMAL HIGH (ref 11.5–15.5)
WBC: 4.2 10*3/uL (ref 4.0–10.5)
nRBC: 0 % (ref 0.0–0.2)

## 2023-08-12 LAB — MAGNESIUM: Magnesium: 1.9 mg/dL (ref 1.7–2.4)

## 2023-08-12 LAB — BASIC METABOLIC PANEL
Anion gap: 6 (ref 5–15)
BUN: 7 mg/dL (ref 6–20)
CO2: 20 mmol/L — ABNORMAL LOW (ref 22–32)
Calcium: 7.6 mg/dL — ABNORMAL LOW (ref 8.9–10.3)
Chloride: 112 mmol/L — ABNORMAL HIGH (ref 98–111)
Creatinine, Ser: 0.53 mg/dL — ABNORMAL LOW (ref 0.61–1.24)
GFR, Estimated: >60 mL/min (ref >60)
Glucose, Bld: 139 mg/dL — ABNORMAL HIGH (ref 70–99)
Potassium: 3.9 mmol/L (ref 3.5–5.1)
Sodium: 138 mmol/L (ref 135–145)

## 2023-08-12 LAB — PROTIME-INR
INR: 1.4 — ABNORMAL HIGH (ref 0.8–1.2)
Prothrombin Time: 17.1 s — ABNORMAL HIGH (ref 11.4–15.2)

## 2023-08-12 LAB — PHOSPHORUS: Phosphorus: 2.7 mg/dL (ref 2.5–4.6)

## 2023-08-12 LAB — MRSA NEXT GEN BY PCR, NASAL: MRSA by PCR Next Gen: NOT DETECTED

## 2023-08-12 LAB — GLUCOSE, CAPILLARY: Glucose-Capillary: 242 mg/dL — ABNORMAL HIGH (ref 70–99)

## 2023-08-12 MED ORDER — PANTOPRAZOLE SODIUM 40 MG PO TBEC
40.0000 mg | DELAYED_RELEASE_TABLET | Freq: Two times a day (BID) | ORAL | Status: DC
  Administered 2023-08-12 – 2023-08-18 (×12): 40 mg via ORAL
  Filled 2023-08-12 (×12): qty 1

## 2023-08-12 MED ORDER — ENSURE ENLIVE PO LIQD
237.0000 mL | Freq: Two times a day (BID) | ORAL | Status: DC
  Administered 2023-08-13 – 2023-08-25 (×16): 237 mL via ORAL

## 2023-08-12 MED ORDER — LACTULOSE 10 GM/15ML PO SOLN
20.0000 g | Freq: Three times a day (TID) | ORAL | Status: DC
  Administered 2023-08-12 – 2023-08-16 (×12): 20 g via ORAL
  Filled 2023-08-12 (×12): qty 30

## 2023-08-12 NOTE — Progress Notes (Signed)
GI Inpatient Follow-up Note  Subjective:  Patient seen and was sleepy due to ativan. Per ICU nurse he was transferred to agitation and hypertension which improved with ativan. No bowel movements.  Scheduled Inpatient Medications:   sodium chloride   Intravenous Once   Chlorhexidine Gluconate Cloth  6 each Topical Daily   folic acid  1 mg Oral Daily   gabapentin  100 mg Oral BID   hydrocortisone sod succinate (SOLU-CORTEF) inj  50 mg Intravenous Q8H   lactulose  20 g Oral TID   midodrine  10 mg Oral TID WC   nicotine  21 mg Transdermal Daily   pantoprazole  40 mg Oral BID   QUEtiapine  50 mg Oral QHS   rifaximin  550 mg Oral BID   sodium chloride flush  10-40 mL Intracatheter Q12H   thiamine  100 mg Oral Daily    Continuous Inpatient Infusions:    cefTRIAXone (ROCEPHIN)  IV 1 g (08/11/23 2236)    PRN Inpatient Medications:  acetaminophen, docusate sodium, LORazepam **OR** LORazepam, methocarbamol, oxyCODONE, polyethylene glycol, sodium chloride flush  Review of Systems:  Review of Systems  Constitutional:  Negative for chills and fever.  Respiratory:  Negative for cough.   Cardiovascular:  Negative for chest pain.  Gastrointestinal:  Negative for abdominal pain, blood in stool and melena.  Musculoskeletal:  Positive for joint pain.  Skin:  Negative for rash.  Neurological:  Negative for focal weakness.  All other systems reviewed and are negative.     Physical Examination: BP 117/78   Pulse 96   Temp 97.8 F (36.6 C) (Oral)   Resp (!) 24   Ht 5\' 6"  (1.676 m)   Wt 92.3 kg   SpO2 100%   BMI 32.84 kg/m  Gen: Sleepy but more easily arousable than yesterday HEENT: PEERLA, EOMI, Neck: supple, no JVD or thyromegaly Chest: No respiratory distress Abd: soft, non-tender, non-distended Ext: trace edema, well perfused with 2+ pulses, Skin: no rash or lesions noted Lymph: no LAD  Data: Lab Results  Component Value Date   WBC 4.2 08/12/2023   HGB 7.7 (L)  08/12/2023   HCT 22.2 (L) 08/12/2023   MCV 92.9 08/12/2023   PLT 82 (L) 08/12/2023   Recent Labs  Lab 08/11/23 0430 08/11/23 1325 08/12/23 0520  HGB 7.0* 7.6* 7.7*   Lab Results  Component Value Date   NA 138 08/12/2023   K 3.9 08/12/2023   CL 112 (H) 08/12/2023   CO2 20 (L) 08/12/2023   BUN 7 08/12/2023   CREATININE 0.53 (L) 08/12/2023   Lab Results  Component Value Date   ALT 16 08/11/2023   AST 40 08/11/2023   ALKPHOS 100 08/11/2023   BILITOT 2.2 (H) 08/11/2023   Recent Labs  Lab 08/08/23 1952 08/10/23 0500 08/12/23 0520  APTT 41*  --   --   INR 1.7*   < > 1.4*   < > = values in this interval not displayed.   Assessment/Plan: Mr. Napieralski is a 52 y.o. gentleman with history of alcoholic cirrhosis s/p TIPS due to esophageal variceal bleeding, HFpEF, CAD, adrenal insufficiency, polysubstance abuse including cocaine, and celiac/SMA stenosis here with rectal bleeding and found to have duodenal ulcers. Most of the ulcers were clean based except one with pigmented spot. By guidelines, pigmented spots do not need to be treated but given positive CTA, purastat was applied to the lesion. Had bowel movement today with stable hemoglobin   Recommendations:  - advance diet as  tolerated, recommend protein supplementation - PO PPI BID - complete 7 day course of antibiotics - monitor for recurrent bleeding - avoid all NSAIDS - continue to hold plavix, if needs to restart then would be ok with restarting around 8/30.  - continue rifaximin and lactulose. Will be unable to get rifaximin at discharge so can continue with lactulose monotherapy - maintain two large bore IV's - transfuse to keep hemoglobin > 7 - please try to avoid any sedating medications such as opioids and benzos.  - we will continue to follow, will likely stop in to see again on Thursday, please call with any questions or concerns in the interim   Merlyn Lot MD, MPH [facility] GI

## 2023-08-12 NOTE — Progress Notes (Signed)
Progress Note   [name] [id number] DOB: [birth year] DOA: 08/08/2023     4 DOS: the patient was seen and examined on 08/12/2023   Brief hospital course: 52 y.o. gentleman with PMHx alcohol abuse, alcoholic liver cirrhosis with portal gastropathy, s/p TIPS, superior mesenteric artery stenosis s/p stenting, HTN, HLD, CAD, STEMI, dCHF, GERD, depression with anxiety, adrenal insufficiency (Addison's disease), migraine headache, cardiac arrest due to V-fib, recurrent GI bleeding, cocaine abuse, tobacco abuse who was admitted to ICU on evening of 08/07/2023 with Hemorrhagic shock due to Acute GI bleed, requiring pressors for BP support.    See ICU progress note of 08/09/23 for detailed HPI initial hospital course.  In summary, Patient required transfusion of 2 units pRBC's.  Gastroenterology consulted, took patient for EGD where non-bleeding duodenal ulcers were seen, one area treated with Purastat for concern of a healing, recently bleeding vessel.  TRH assumed care 08/10/2023.  Patient's BP soft but stable since weaned off pressors 8/24.  Transferred to med/tele floor.  Further hospital course and management as outlined below.  In summary, hospital course complicated by ongoing hypotension, acute withdrawal syndrome with encephalopathy.       Assessment and Plan:  #Hypovolemic/Hemorrhagic Shock PMHx: HFpEF, CAD, HTN, HLD 8/26 -- BP's low, given fluid bolus for MAP < 60 this afternoon 8/27 -- BP's more elevated likely 2/2 withdrawal  --Telemetry --Transfusions as needed --IV fluids if needed for BP support  --Continue stress dose IV Solu-Cortef for now --On midodrine - continue, wean as able --BP/MAP stable off vasopressors --Goal MAP > 60 --Diuresis on hold with low BP's   #Acute blood loss anemia in the setting of acute GI bleed #Thrombocytopenia PMHx: Alcoholic Cirrhosis s/p TIPS 4/27, esophageal varices,  CT Angio GI Bleed  showed 1. Active GI bleed in the region  of the gastric antrum/pylorus and proximal duodenum.2. Cirrhosis with patent appearing TIPS. 3. Other non-acute findings as above. Discussed abnormal findings with on call GI Dr. Mia Creek and IR oncall Dr. Fredia Sorrow who both reviewed and agreed does not look like a variceal bleed by CTA. There is thickening of the distal stomach and proximal duodenum and the focal bleed is in the duodenal bulb. Likely a bleeding ulcer. TIPS is widely patent by CT. Recommend that patient be stabilized with consideration of endoscopy after some resuscitation.  --GI following, appreciate recommendations --Duodenal ulcers seen on EGD 8/24 --s/p 2 units RBC's, 1 unit FFP in ICU --Trend Hbg, transfuse if < 7.0 --Monitor closely for bleeding signs --Per GI - advance diet as tolerated --Hold off Plavix --SCD's for VTE Prophylaxis  --Off Octreotide drip --IV PPI BID --IV Rocephin to complete 7 days   #Acute Metabolic Encephalopathy -- due to  # Acute withdrawal syndrome # Hepatic Encephalopathy 8/26 pt developed progressive withdrawal symptoms and confusion. Ammonia 49.  CIWA score 15 this afternoon --Resumed on CIWA, PRN Ativan --Stop Librium given liver disease --Start lactulose 10 g PO TID --Escalating CIWA scores, transfer to stepdown  --PCCM aware, in case Precedex is needed --Delirium precautions  #Hx cirrhosis 2/2 EtOH #Portal gastropathy S/p TIPS 4/27 --GI following, appreciate recommendations --Continue IV Rocephin to complete 7 days --Rifaximin started (Lactulose only on d/c) --Started lactulose for hepatic enc as above   #Non Anion Gap Metabolic Acidosis due Lactic Acidosis #Hypokalemia #Hypophosphatemia --Monitor BMP, Mg, phos levels --Trend lactic acid acid (last 3.1) - on IV fluids --K 3.9 after replacement yesterday --Mgmt of underlying issues as outlined   #Hx adrenal insufficiency  --  started on IV Solu-Cortef in ICU  --monitor Bp's closely, escalate steroid if needed --appears not  taking steroids outpatient, per med history (? Supposed to take Cortef 15 mg daily.  I see 10 mg daily prescribed back in May) --AM cortisol 1.9 --normal TSH, Free T4 --on IV Solu-Cortef as above for now    #Hx polysubstance abuse including EtOH --See above for mgmt of withdrawal -UDS+Cocaine, Last Drink on 08/06/23 --continue thiamine and folate            Subjective: Pt sitting up in bed appearing short of breath. Says he doesn't feel well, but cannot say in what way or what symptoms, just that he feels really badly.  Reports being hot, has fan pointing at his face.  Did not sleep very well last night.  Shortly after my encounter, notified by RN of escalating CIWA score up to 18 despite recent Ativan given.  Transferred pt to stepdown for closer monitoring given escalating.  Notified PCCM team in case of need for Precedex   Physical Exam: Vitals:   08/12/23 1115 08/12/23 1200 08/12/23 1400 08/12/23 1500  BP: (!) 159/106 117/78 113/78 119/60  Pulse: (!) 128 96 85 78  Resp: (!) 32 (!) 24 (!) 22 16  Temp:      TempSrc:      SpO2: 98% 100% 100% 100%  Weight:      Height:       General exam: awake, alert, no acute distress, chronically ill appearing HEENT: moist mucus membranes, hearing grossly normal  Respiratory system: CTAB, tachypneic, no wheezes, rales or rhonchi, normal respiratory effort. Cardiovascular system: normal S1/S2, RRR, mild pedal edema.   Gastrointestinal system: soft, NT, ND, no HSM felt, +bowel sounds. Central nervous system: A&O x 3. no gross focal neurologic deficits, normal speech Skin: warm, mildly diaphoretic, no rashes seen on visualized skin Extremities: tremulous outstretched hands Psychiatry: anxious mood, flat affect, judgement and insight appear normal   Data Reviewed:  Notable labs ---  Hbg 7.0 >> 7.6 >> 8.0 >> 7.0 >> 7.6 >> 7.7 Bicarb 20, glucose 139, Cr 0.53, Ca 7.6 Last lactic acid 3.6 >> 1.3 normalized INR 1.4 Platelets 82  Family  Communication: none present.   Disposition: Status is: Inpatient Remains inpatient appropriate because: severity of illness as above, on IV therapies. Worsening withdrawal syndrome.   Planned Discharge Destination: Home    Time spent: 52 minutes including time at bedside and coordinating care  Author: Pennie Banter, DO 08/12/2023 5:04 PM  For on call review [email or web address].

## 2023-08-12 NOTE — Progress Notes (Signed)
Patient arrived on unit appeared calm, resting in bed. After transferring beds while getting situated, became more distressed. On monitor, HR 140s, BP 170s, RR 40s, CIWA 11, gave PRN medications per orders. Placed on 2L  for supplement, patient sitting upright. Changed xray order to stat. Vitals seem to be slowly improving, appears slightly less distressed. RRs still upper 20s/30s. MD notified.

## 2023-08-12 NOTE — Progress Notes (Signed)
Patient belongings packed up and taken to ICU-18 with patient including phone charger, 2 cell phones, earrings, and glasses.

## 2023-08-12 NOTE — Progress Notes (Signed)
Patient's mother, Wendie Simmer, called and updated on move to ICU per patient request.

## 2023-08-13 DIAGNOSIS — I25118 Atherosclerotic heart disease of native coronary artery with other forms of angina pectoris: Secondary | ICD-10-CM | POA: Diagnosis not present

## 2023-08-13 DIAGNOSIS — F10231 Alcohol dependence with withdrawal delirium: Secondary | ICD-10-CM | POA: Diagnosis not present

## 2023-08-13 DIAGNOSIS — G9341 Metabolic encephalopathy: Secondary | ICD-10-CM | POA: Diagnosis not present

## 2023-08-13 DIAGNOSIS — K264 Chronic or unspecified duodenal ulcer with hemorrhage: Secondary | ICD-10-CM | POA: Diagnosis not present

## 2023-08-13 LAB — COMPREHENSIVE METABOLIC PANEL
ALT: 31 U/L (ref 0–44)
AST: 73 U/L — ABNORMAL HIGH (ref 15–41)
Albumin: 2.3 g/dL — ABNORMAL LOW (ref 3.5–5.0)
Alkaline Phosphatase: 138 U/L — ABNORMAL HIGH (ref 38–126)
Anion gap: 3 — ABNORMAL LOW (ref 5–15)
BUN: 7 mg/dL (ref 6–20)
CO2: 23 mmol/L (ref 22–32)
Calcium: 7.9 mg/dL — ABNORMAL LOW (ref 8.9–10.3)
Chloride: 112 mmol/L — ABNORMAL HIGH (ref 98–111)
Creatinine, Ser: 0.66 mg/dL (ref 0.61–1.24)
GFR, Estimated: >60 mL/min (ref >60)
Glucose, Bld: 156 mg/dL — ABNORMAL HIGH (ref 70–99)
Potassium: 3.5 mmol/L (ref 3.5–5.1)
Sodium: 138 mmol/L (ref 135–145)
Total Bilirubin: 1.9 mg/dL — ABNORMAL HIGH (ref 0.3–1.2)
Total Protein: 5.2 g/dL — ABNORMAL LOW (ref 6.5–8.1)

## 2023-08-13 LAB — AMMONIA: Ammonia: 56 umol/L — ABNORMAL HIGH (ref 9–35)

## 2023-08-13 LAB — CBC
HCT: 23.3 % — ABNORMAL LOW (ref 39.0–52.0)
Hemoglobin: 7.7 g/dL — ABNORMAL LOW (ref 13.0–17.0)
MCH: 31.4 pg (ref 26.0–34.0)
MCHC: 33 g/dL (ref 30.0–36.0)
MCV: 95.1 fL (ref 80.0–100.0)
Platelets: 102 10*3/uL — ABNORMAL LOW (ref 150–400)
RBC: 2.45 MIL/uL — ABNORMAL LOW (ref 4.22–5.81)
RDW: 19.4 % — ABNORMAL HIGH (ref 11.5–15.5)
WBC: 6.9 10*3/uL (ref 4.0–10.5)
nRBC: 0 % (ref 0.0–0.2)

## 2023-08-13 MED ORDER — POTASSIUM CHLORIDE CRYS ER 20 MEQ PO TBCR
40.0000 meq | EXTENDED_RELEASE_TABLET | Freq: Once | ORAL | Status: AC
  Administered 2023-08-13: 40 meq via ORAL
  Filled 2023-08-13: qty 2

## 2023-08-13 NOTE — Progress Notes (Signed)
Progress Note   [name]:[id number] DOB: 1971-08-07 DOA: 08/08/2023     5 DOS: the patient was seen and examined on 08/13/2023   Brief hospital course: 52 y.o. gentleman with PMHx alcohol abuse, alcoholic liver cirrhosis with portal gastropathy, s/p TIPS, superior mesenteric artery stenosis s/p stenting, HTN, HLD, CAD, STEMI, dCHF, GERD, depression with anxiety, adrenal insufficiency (Addison's disease), migraine headache, cardiac arrest due to V-fib, recurrent GI bleeding, cocaine abuse, tobacco abuse who was admitted to ICU on evening of 08/07/2023 with Hemorrhagic shock due to Acute GI bleed, requiring pressors for BP support.    See ICU progress note of 08/09/23 for detailed HPI initial hospital course.  In summary, Patient required transfusion of 2 units pRBC's.  Gastroenterology consulted, took patient for EGD where non-bleeding duodenal ulcers were seen, one area treated with Purastat for concern of a healing, recently bleeding vessel.  TRH assumed care 08/10/2023.  Patient's BP soft but stable since weaned off pressors 8/24.  Transferred to med/tele floor.  Further hospital course and management as outlined below.  In summary, hospital course complicated by ongoing hypotension, acute withdrawal syndrome with encephalopathy.     8/28: Transferred back to floor/MedSurg.  Assessment and Plan:  #Hypovolemic/Hemorrhagic Shock PMHx: HFpEF, CAD, HTN, HLD 8/26 -- BP's low, given fluid bolus for MAP < 60 this afternoon 8/27 -- BP's more elevated likely 2/2 withdrawal  8/28: Transfer to floor --Transfusions as needed -- Stopping IV fluid.  Start regular diet --Continue stress dose IV Solu-Cortef for now --On midodrine - continue, wean as able --BP/MAP stable off vasopressors --Goal MAP > 60 --Diuresis on hold with low BP's   #Acute blood loss anemia in the setting of acute GI bleed #Thrombocytopenia PMHx: Alcoholic Cirrhosis s/p TIPS 4/27, esophageal varices,  CT  Angio GI Bleed  showed 1. Active GI bleed in the region of the gastric antrum/pylorus and proximal duodenum.2. Cirrhosis with patent appearing TIPS. 3. Other non-acute findings as above. Discussed abnormal findings with on call GI Dr. Mia Creek and IR oncall Dr. Fredia Sorrow who both reviewed and agreed does not look like a variceal bleed by CTA. There is thickening of the distal stomach and proximal duodenum and the focal bleed is in the duodenal bulb. Likely a bleeding ulcer. TIPS is widely patent by CT. Recommend that patient be stabilized with consideration of endoscopy after some resuscitation.  --GI following, appreciate recommendations --Duodenal ulcers seen on EGD 8/24 --s/p 2 units RBC's, 1 unit FFP in ICU --Trend Hbg, transfuse if < 7.0 --Monitor closely for bleeding signs -- Remove central line/triple IJ --Hold off Plavix --SCD's for VTE Prophylaxis  --Off Octreotide drip -- Oral PPI BID --IV Rocephin to complete 7 days   #Acute Metabolic Encephalopathy -- due to  # Acute withdrawal syndrome # Hepatic Encephalopathy 8/26 pt developed progressive withdrawal symptoms and confusion. Ammonia 49.  CIWA score 15 this afternoon --Resumed on CIWA, PRN Ativan --Stop Librium given liver disease -- Continue lactulose 10 g PO TID -- Due to escalating CIWA scores, he was transfer to stepdown on 8/27.  Will transfer him back on the floor today as doing better --Delirium precautions  #Hx cirrhosis 2/2 EtOH #Portal gastropathy S/p TIPS 4/27 --GI following, appreciate recommendations --Continue IV Rocephin to complete 7 days -- Continue rifaximin and lactulose   #Non Anion Gap Metabolic Acidosis due Lactic Acidosis #Hypokalemia #Hypophosphatemia --Monitor BMP, Mg, phos levels --Trend lactic acid acid 3.1 -> 1.3 -- Electrolytes replacement per pharmacy   #Hx adrenal insufficiency  --  Continue IV Solu-Cortef for now --monitor Bp's closely, escalate steroid if needed --appears not taking  steroids outpatient, per med history (? Supposed to take Cortef 15 mg daily.  I see 10 mg daily prescribed back in May) --AM cortisol 1.9 --normal TSH, Free T4   #Hx polysubstance abuse including EtOH --See above for mgmt of withdrawal -UDS+Cocaine, Last Drink on 08/06/23 --continue thiamine and folate            Subjective: He is feeling better.  Wants to eat.  Hoping to find some shelter and transportation at discharge as he is not sure about his living situation   Physical Exam: Vitals:   08/13/23 0600 08/13/23 0700 08/13/23 0800 08/13/23 0859  BP: 129/71 109/67 (!) 124/91 124/64  Pulse: 89 89 (!) 112 92  Resp: 19 18 16 20   Temp:   98 F (36.7 C) 98.1 F (36.7 C)  TempSrc:   Oral   SpO2: 99% 99% 100% 100%  Weight:      Height:       General exam: awake, alert, no acute distress, chronically ill appearing HEENT: moist mucus membranes, hearing grossly normal  Respiratory system: CTAB, tachypneic, no wheezes, rales or rhonchi, normal respiratory effort. Cardiovascular system: normal S1/S2, RRR, mild pedal edema.   Gastrointestinal system: soft, NT, ND, no HSM felt, +bowel sounds. Central nervous system: A&O x 3. no gross focal neurologic deficits, normal speech Skin: warm, mildly diaphoretic, no rashes seen on visualized skin Extremities: tremulous outstretched hands Psychiatry: anxious mood, flat affect, judgement and insight appear normal   Data Reviewed:  Notable labs ---  Hbg 7.0 >> 7.6 >> 8.0 >> 7.0 >> 7.6 >> 7.7 Bicarb 20, glucose 139, Cr 0.53, Ca 7.6 Last lactic acid 3.6 >> 1.3 normalized INR 1.4 Platelets 82->102  Family Communication: none present.   Disposition: Status is: Inpatient Remains inpatient appropriate because: severity of illness as above, on IV therapies. withdrawal syndrome.   Planned Discharge Destination: Home    Time spent: 35 minutes including time at bedside and coordinating care  Author: Delfino Lovett, MD 08/13/2023 2:08  PM  For on call review [email or web address].

## 2023-08-13 NOTE — Progress Notes (Signed)
PHARMACY CONSULT NOTE - ELECTROLYTES  Pharmacy Consult for Electrolyte Monitoring and Replacement   Recent Labs: Height: 5\' 6"  (167.6 cm) Weight: 87.6 kg (193 lb 2 oz) IBW/kg (Calculated) : 63.8 Estimated Creatinine Clearance: 113.3 mL/min (by C-G formula based on SCr of 0.66 mg/dL). Potassium (mmol/L)  Date Value  08/13/2023 3.5  09/06/2013 3.5   Magnesium (mg/dL)  Date Value  32/44/0102 1.9  03/09/2013 2.2   Calcium (mg/dL)  Date Value  72/53/6644 7.9 (L)   Calcium, Total (mg/dL)  Date Value  03/47/4259 8.9   Albumin (g/dL)  Date Value  56/38/7564 2.3 (L)  09/06/2013 4.0   Phosphorus (mg/dL)  Date Value  33/29/5188 2.7   Sodium (mmol/L)  Date Value  08/13/2023 138  09/06/2013 134 (L)   Corrected Ca: 9.3 mg/dL  Assessment  Ethan Wells is a 52 y.o. male presenting with acute GI bleed. PMH significant for alcohol abuse, alcoholic liver cirrhosis with portal gastropathy, s/p TIPS, superior mesenteric artery stenosis s/p stenting, HTN, HLD, CAD, STEMI, dCHF, GERD, depression with anxiety, adrenal insufficiency (Addison's disease), migraine headache, cardiac arrest due to V-fib, recurrent GI bleeding, cocaine abuse, tobacco abuse . Pharmacy has been consulted to monitor and replace electrolytes.  Diet: thin fluids  Goal of Therapy: Electrolytes WNL  Plan:  K 3.5. Goal K > 4. Give Kcl x 1 this afternoon.  Check BMP, Mg, Phos with AM labs  Thank you for allowing pharmacy to be a part of this patient's care.   Elliot Gurney, PharmD, BCPS Clinical Pharmacist  08/13/2023 2:45 PM

## 2023-08-14 DIAGNOSIS — R578 Other shock: Secondary | ICD-10-CM

## 2023-08-14 DIAGNOSIS — K7030 Alcoholic cirrhosis of liver without ascites: Secondary | ICD-10-CM

## 2023-08-14 DIAGNOSIS — F418 Other specified anxiety disorders: Secondary | ICD-10-CM

## 2023-08-14 DIAGNOSIS — I25118 Atherosclerotic heart disease of native coronary artery with other forms of angina pectoris: Secondary | ICD-10-CM | POA: Diagnosis not present

## 2023-08-14 DIAGNOSIS — D62 Acute posthemorrhagic anemia: Secondary | ICD-10-CM | POA: Diagnosis not present

## 2023-08-14 DIAGNOSIS — K264 Chronic or unspecified duodenal ulcer with hemorrhage: Secondary | ICD-10-CM | POA: Diagnosis not present

## 2023-08-14 DIAGNOSIS — F1429 Cocaine dependence with unspecified cocaine-induced disorder: Secondary | ICD-10-CM

## 2023-08-14 DIAGNOSIS — G9341 Metabolic encephalopathy: Secondary | ICD-10-CM | POA: Diagnosis not present

## 2023-08-14 DIAGNOSIS — E2740 Unspecified adrenocortical insufficiency: Secondary | ICD-10-CM | POA: Diagnosis not present

## 2023-08-14 LAB — CBC
HCT: 23.1 % — ABNORMAL LOW (ref 39.0–52.0)
Hemoglobin: 7.6 g/dL — ABNORMAL LOW (ref 13.0–17.0)
MCH: 31.1 pg (ref 26.0–34.0)
MCHC: 32.9 g/dL (ref 30.0–36.0)
MCV: 94.7 fL (ref 80.0–100.0)
Platelets: 108 10*3/uL — ABNORMAL LOW (ref 150–400)
RBC: 2.44 MIL/uL — ABNORMAL LOW (ref 4.22–5.81)
RDW: 19.1 % — ABNORMAL HIGH (ref 11.5–15.5)
WBC: 6.8 10*3/uL (ref 4.0–10.5)
nRBC: 0 % (ref 0.0–0.2)

## 2023-08-14 LAB — BASIC METABOLIC PANEL
Anion gap: 5 (ref 5–15)
BUN: 10 mg/dL (ref 6–20)
CO2: 20 mmol/L — ABNORMAL LOW (ref 22–32)
Calcium: 8.2 mg/dL — ABNORMAL LOW (ref 8.9–10.3)
Chloride: 112 mmol/L — ABNORMAL HIGH (ref 98–111)
Creatinine, Ser: 0.57 mg/dL — ABNORMAL LOW (ref 0.61–1.24)
GFR, Estimated: >60 mL/min (ref >60)
Glucose, Bld: 170 mg/dL — ABNORMAL HIGH (ref 70–99)
Potassium: 4 mmol/L (ref 3.5–5.1)
Sodium: 137 mmol/L (ref 135–145)

## 2023-08-14 LAB — PHOSPHORUS: Phosphorus: 2.8 mg/dL (ref 2.5–4.6)

## 2023-08-14 LAB — MAGNESIUM: Magnesium: 2.3 mg/dL (ref 1.7–2.4)

## 2023-08-14 NOTE — Care Management Important Message (Signed)
Important Message  Patient Details  Name: Ethan Wells MRN: [id number] Date of Birth: 1971/03/04   Medicare Important Message Given:  Yes     Ethan Wells A Carlyon Nolasco 08/14/2023, 2:57 PM

## 2023-08-14 NOTE — Evaluation (Signed)
Occupational Therapy Evaluation Patient Details Name: Ethan Wells MRN: [id number] DOB: August 19, 1971 Today's Date: 08/14/2023   History of Present Illness Pt is a 52 y.o. male with significant PMH of alcohol abuse, alcoholic liver cirrhosis with portal gastropathy, s/p TIPS, superior mesenteric artery stenosis, HTN, HLD, CAD, STEMI, dCHF, GERD, depression with anxiety, adrenal insufficiency (Addison's disease), migraine headache, cardiac arrest due to V-fib, recurrent GI bleeding, cocaine abuse, tobacco abuse, who presented to the ED with rectal bleeding.   Clinical Impression   Patient presenting with decreased Ind in self care, balance, functional mobility/transfers, endurance, and safety awareness. Patient reports being Ind at baseline but homeless and living in a trunk parked outside of his mother's home. He does endorse going into her home during the day to assist her with things but always sleeps in trunk. Pt endorses some difficulty with ADLs and IADLs but that he manages to get them done himself.  Patient refuses OOB mobility and transfers but demonstrates bed mobility without assistance and nursing reports he has been ambulating to bathroom. OT to follow for safety concerns and strengthening. Patient will benefit from acute OT to increase overall independence in the areas of ADLs, functional mobility, and safety awareness in order to safely discharge.      If plan is discharge home, recommend the following: A little help with walking and/or transfers;A little help with bathing/dressing/bathroom;Assistance with cooking/housework;Assist for transportation;Help with stairs or ramp for entrance;Direct supervision/assist for financial management;Direct supervision/assist for medications management    Functional Status Assessment  Patient has had a recent decline in their functional status and demonstrates the ability to make significant improvements in function in a reasonable and  predictable amount of time.  Equipment Recommendations  None recommended by OT       Precautions / Restrictions Precautions Precautions: Fall Restrictions Weight Bearing Restrictions: No      Mobility Bed Mobility Overal bed mobility: Modified Independent Bed Mobility: Supine to Sit, Rolling Rolling: Modified independent (Device/Increase time)   Supine to sit: Modified independent (Device/Increase time), HOB elevated          Transfers                   General transfer comment: Pt refuses      Balance Overall balance assessment: Needs assistance Sitting-balance support: Single extremity supported, Feet supported Sitting balance-Leahy Scale: Good                                     ADL either performed or assessed with clinical judgement   ADL Overall ADL's : Needs assistance/impaired                                       General ADL Comments: Pt declines self care tasks and functional mobility. He performed bed mobility without assistance and self feeds independently.     Vision Patient Visual Report: No change from baseline              Pertinent Vitals/Pain Pain Assessment Pain Assessment: 0-10 Pain Score: 0-No pain     Extremity/Trunk Assessment Upper Extremity Assessment Upper Extremity Assessment: Generalized weakness   Lower Extremity Assessment Lower Extremity Assessment: Generalized weakness       Communication Communication Communication: No apparent difficulties   Cognition Arousal: Lethargic, Alert Behavior During Therapy: Flat affect,  Impulsive Overall Cognitive Status: No family/caregiver present to determine baseline cognitive functioning                                                  Home Living Family/patient expects to be discharged to:: Other (Comment)                                 Additional Comments: Pt is homeless. He lives in trunk parked  outside of his mother's home. He reports during the day he is in her house to assist her with things but sleeps in his truck.      Prior Functioning/Environment Prior Level of Function : Independent/Modified Independent             Mobility Comments: states he has a 4 wheeled walker ADLs Comments: Pt reports he has needed assistance with ADLs and IADLs but manages to get things done because he has no one to assist him.        OT Problem List: Decreased strength;Decreased activity tolerance;Decreased safety awareness;Impaired balance (sitting and/or standing);Decreased knowledge of use of DME or AE      OT Treatment/Interventions: Self-care/ADL training;Therapeutic exercise;Therapeutic activities;Energy conservation;DME and/or AE instruction;Patient/family education;Balance training    OT Goals(Current goals can be found in the care plan section) Acute Rehab OT Goals Patient Stated Goal: to go home OT Goal Formulation: With patient Time For Goal Achievement: 08/28/23 Potential to Achieve Goals: Fair ADL Goals Pt Will Perform Grooming: with modified independence;standing Pt Will Perform Lower Body Dressing: with modified independence;sit to/from stand Pt Will Transfer to Toilet: with modified independence;ambulating Pt Will Perform Toileting - Clothing Manipulation and hygiene: with modified independence;sit to/from stand  OT Frequency: Min 1X/week       AM-PAC OT "6 Clicks" Daily Activity     Outcome Measure Help from another person eating meals?: None Help from another person taking care of personal grooming?: None Help from another person toileting, which includes using toliet, bedpan, or urinal?: A Little Help from another person bathing (including washing, rinsing, drying)?: A Little Help from another person to put on and taking off regular upper body clothing?: None Help from another person to put on and taking off regular lower body clothing?: A Little 6 Click Score:  21   End of Session Nurse Communication: Mobility status  Activity Tolerance: Patient tolerated treatment well;Other (comment) (self limiting behaviors) Patient left: in bed;with call bell/phone within reach;with bed alarm set  OT Visit Diagnosis: Unsteadiness on feet (R26.81);Repeated falls (R29.6);Muscle weakness (generalized) (M62.81)                Time: 0102-7253 OT Time Calculation (min): 23 min Charges:  OT General Charges $OT Visit: 1 Visit OT Evaluation $OT Eval Low Complexity: 1 Low  Jackquline Denmark, MS, OTR/L , CBIS ascom [phone]  08/14/23, 2:15 PM

## 2023-08-14 NOTE — Evaluation (Signed)
Physical Therapy Evaluation Patient Details Name: Ethan Wells MRN: [id number] DOB: 07-28-1971 Today's Date: 08/14/2023  History of Present Illness  Pt is a 52 y.o. male with significant PMH of alcohol abuse, alcoholic liver cirrhosis with portal gastropathy, s/p TIPS, superior mesenteric artery stenosis, HTN, HLD, CAD, STEMI, dCHF, GERD, depression with anxiety, adrenal insufficiency (Addison's disease), migraine headache, cardiac arrest due to V-fib, recurrent GI bleeding, cocaine abuse, tobacco abuse, who presented to the ED with rectal bleeding.  Clinical Impression   Pt is received in bed, he reports being "tired" and not wanting to get out of bed. After some convincing, he is agreeable to PT session. Pt performs bed mobility mod I with use of hand rails, use of bed remote, and min cuing for hand placement. In sitting EOB, Pt was able to demonstrate good seated balance BUE support and min unsteadiness with 1 UE support CGA for safety. Pt refused to perform OOB activities due to reports of tiredness in today's session. Pt would benefit from increase OOB activities with mobility specialist. Pt would benefit from skilled PT to address above deficits and promote optimal return to PLOF.      If plan is discharge home, recommend the following: A little help with walking and/or transfers;Help with stairs or ramp for entrance;Direct supervision/assist for financial management;Supervision due to cognitive status   Can travel by private vehicle        Equipment Recommendations Other (comment) (TBD at this time)  Recommendations for Other Services       Functional Status Assessment Patient has had a recent decline in their functional status and/or demonstrates limited ability to make significant improvements in function in a reasonable and predictable amount of time     Precautions / Restrictions Precautions Precautions: Fall Restrictions Weight Bearing Restrictions: No       Mobility  Bed Mobility Overal bed mobility: Needs Assistance Bed Mobility: Supine to Sit     Supine to sit: Modified independent (Device/Increase time), Used rails, HOB elevated, Contact guard     General bed mobility comments: Able to perform bed mobility with min cuing and sit EOB CGA for safety    Transfers                   General transfer comment: NT due to uninterest and reports of tiredness    Ambulation/Gait               General Gait Details: NT due to fatigue  Stairs            Wheelchair Mobility     Tilt Bed    Modified Rankin (Stroke Patients Only)       Balance Overall balance assessment: Needs assistance Sitting-balance support: Single extremity supported, Feet supported Sitting balance-Leahy Scale: Good Sitting balance - Comments: Able to maintain seated EOB balance during functional activities CGA for safety       Standing balance comment: NT at this time due to reports of tiredness                             Pertinent Vitals/Pain Pain Assessment Pain Assessment: 0-10 Pain Score: 7  Pain Location: gross body Pain Descriptors / Indicators: Discomfort, Constant Pain Intervention(s): Limited activity within patient's tolerance, Monitored during session    Home Living Family/patient expects to be discharged to:: Other (Comment)  Additional Comments: lives in truck reporting he "lives around"; no clear response if he stays close to [employer] house    Prior Function Prior Level of Function : Independent/Modified Independent             Mobility Comments: states he has a 4 wheeled walker       Extremity/Trunk Assessment   Upper Extremity Assessment Upper Extremity Assessment: Defer to OT evaluation    Lower Extremity Assessment Lower Extremity Assessment: Generalized weakness       Communication   Communication Communication: Other (comment) (occasional mumbling of  words)  Cognition Arousal: Lethargic Behavior During Therapy: Flat affect, Impulsive Overall Cognitive Status: No family/caregiver present to determine baseline cognitive functioning                                 General Comments: Occasional mumbling of words and unclear answers to questions but AO x4.        General Comments      Exercises     Assessment/Plan    PT Assessment Patient needs continued PT services  PT Problem List Decreased strength;Decreased mobility;Decreased activity tolerance;Pain       PT Treatment Interventions DME instruction;Gait training;Stair training;Functional mobility training;Therapeutic activities;Therapeutic exercise;Balance training    PT Goals (Current goals can be found in the Care Plan section)  Acute Rehab PT Goals Patient Stated Goal: to go home PT Goal Formulation: With patient Time For Goal Achievement: 08/28/23 Potential to Achieve Goals: Good    Frequency Min 1X/week     Co-evaluation               AM-PAC PT "6 Clicks" Mobility  Outcome Measure Help needed turning from your back to your side while in a flat bed without using bedrails?: None Help needed moving from lying on your back to sitting on the side of a flat bed without using bedrails?: None Help needed moving to and from a bed to a chair (including a wheelchair)?: A Little Help needed standing up from a chair using your arms (e.g., wheelchair or bedside chair)?: A Little Help needed to walk in hospital room?: A Little Help needed climbing 3-5 steps with a railing? : A Little 6 Click Score: 20    End of Session   Activity Tolerance: Patient limited by fatigue Patient left: in bed;with call bell/phone within reach;with bed alarm set Nurse Communication: Mobility status PT Visit Diagnosis: Unsteadiness on feet (R26.81);Repeated falls (R29.6);History of falling (Z91.81);Pain Pain - Right/Left:  (gross body) Pain - part of body: Leg    Time:  3875-6433 PT Time Calculation (min) (ACUTE ONLY): 15 min   Charges:                 Elmon Else, SPT   Fahima Cifelli 08/14/2023, 12:05 PM

## 2023-08-14 NOTE — Progress Notes (Signed)
PHARMACY CONSULT NOTE - ELECTROLYTES  Pharmacy Consult for Electrolyte Monitoring and Replacement   Recent Labs: Height: 5\' 6"  (167.6 cm) Weight: 91.8 kg (202 lb 6.1 oz) IBW/kg (Calculated) : 63.8 Estimated Creatinine Clearance: 115.9 mL/min (by C-G formula based on SCr of 0.66 mg/dL).  Potassium (mmol/L)  Date Value  08/14/2023 4.0  09/06/2013 3.5   Magnesium (mg/dL)  Date Value  12/18/7251 2.3  03/09/2013 2.2   Calcium (mg/dL)  Date Value  66/44/0347 8.2 (L)   Calcium, Total (mg/dL)  Date Value  42/59/5638 8.9   Albumin (g/dL)  Date Value  75/64/3329 2.3 (L)  09/06/2013 4.0   Phosphorus (mg/dL)  Date Value  51/88/4166 2.8   Sodium (mmol/L)  Date Value  08/14/2023 137  09/06/2013 134 (L)   Corrected Ca: 9.56 mg/dL  Assessment  Ethan Wells is a 52 y.o. male presenting with acute GI bleed. PMH significant for alcohol abuse, alcoholic liver cirrhosis with portal gastropathy, s/p TIPS, superior mesenteric artery stenosis s/p stenting, HTN, HLD, CAD, STEMI, dCHF, GERD, depression with anxiety, adrenal insufficiency (Addison's disease), migraine headache, cardiac arrest due to V-fib, recurrent GI bleeding, cocaine abuse, tobacco abuse . Pharmacy has been consulted to monitor and replace electrolytes.  Diet: Thin fluids  Goal of Therapy: Electrolytes WNL  Plan:  Electrolytes WNL - no replacement needed at this time Check BMP, Mg, Phos with AM labs  Thank you for allowing pharmacy to be a part of this patient's care.  Littie Deeds, PharmD PGY1 Pharmacy Resident 08/14/2023 6:01 AM

## 2023-08-14 NOTE — Progress Notes (Addendum)
Progress Note   [name] ZOX:[id number] DOB: 1971/01/23 DOA: 08/08/2023     6 DOS: the patient was seen and examined on 08/14/2023   Brief hospital course:  52 y.o. male with past medical history of alcohol abuse, alcoholic liver cirrhosis with portal gastropathy, s/p TIPS, superior mesenteric artery stenosis s/p stenting, HTN, HLD, CAD, STEMI, diastolic CHF, GERD, depression with anxiety, adrenal insufficiency (Addison's disease), migraine headache, cardiac arrest due to V-fib, recurrent GI bleeding, cocaine abuse, tobacco abuse  was initially admitted to ICU on evening of 08/07/2023 with Hemorrhagic shock due to Acute GI bleed, requiring pressors for BP support.   Patient required transfusion of 2 units pRBC's.  Gastroenterology was consulted and patient underwent EGD with findings of non-bleeding duodenal ulcers, one area treated with Purastat for concern of a healing, recently bleeding vessel. TRH assumed care 08/10/2023.  Patient's BP soft but stable since weaned off pressors 8/24.  Transferred to med/tele floor. Hospital course complicated by ongoing hypotension, acute withdrawal syndrome with encephalopathy.    Assessment and Plan:  #Hypovolemic/Hemorrhagic Shock Received IV fluids and PRBC.  On midodrine.  Was on Solu-Cortef as well.  Goal MAP more than 60.  Diuresis on hold.  Hemoglobin today at 7.6 from previous 7.7.   #Acute blood loss anemia in the setting of acute GI bleed #Thrombocytopenia Alcoholic Cirrhosis s/p TIPS 4/27, esophageal varices,    CT Angio GI Bleed  showed 1. Active GI bleed in the region of the gastric antrum/pylorus and proximal duodenum.2. Cirrhosis with patent appearing TIPSGI Dr. Mia Creek and IR oncall Dr. Fredia Sorrow who both reviewed and agreed does not look like a variceal bleed by CTA.  TIPS is widely patent by CT. GI following the patient and diet has been advanced.  Continue PPI twice daily.  Recommend 7-day course of antibiotic.  Continue to  hold Plavix and avoid all NSAIDs.  Continue rifaximin and lactulose.  Transfuse for hemoglobin less than 7.  Will try to avoid sedative hypnotics.    #Acute Metabolic Encephalopathy  # Acute withdrawal syndrome # Hepatic Encephalopathy On 8/26 patient developed progressive withdrawal symptoms with ammonia of 49.  Had CIWA of 15 on 08/13/2023.  Has been resumed on CIWA protocol as needed Ativan.  Librium has been discontinued.  Continue lactulose and rifaximin.    Alcoholic cirrhosis with Portal gastropathy S/p TIPS 4/27 GI following.  Continue IV Rocephin rifaximin and lactulose.  Chronic diastolic congestive heart failure. Continue to monitor intake and output charting Daily weights.  #Non Anion Gap Metabolic Acidosis due Lactic Acidosis #Hypokalemia #Hypophosphatemia Mild hyponatremia.  Lactate has normalized.  Will closely monitor.  Potassium magnesium and phosphorus has normalized   #Hx adrenal insufficiency  Continue Solu-Cortef.  A.m. cortisol of 1.9.  Thyroid function within normal range. Midodrine as well.  Blood pressure borderline low.   #Hx polysubstance abuse including EtOH -UDS+Cocaine, Last Drink on 08/06/23 --continue thiamine and folate      Subjective:   Today, patient was seen and examined at bedside.  He complains of mild abdominal discomfort.  Has been eating some.  Has generalized weakness.  Uncertain about his disposition  Physical Exam: Vitals:   08/13/23 1943 08/14/23 0403 08/14/23 0436 08/14/23 0713  BP: 118/78 (!) 96/57  (!) 94/52  Pulse: 87 96  78  Resp: 18 18  16   Temp: 98 F (36.7 C) 97.9 F (36.6 C)  98.7 F (37.1 C)  TempSrc: Oral Oral    SpO2: 99% 98%  98%  Weight:  91.8 kg   Height:       General:  Average built, not in obvious distress, appears chronically ill HENT:   No scleral pallor or icterus noted. Oral mucosa is moist.  Chest:  Clear breath sounds.  Diminished breath sounds bilaterally. No crackles or wheezes.  CVS: S1 &S2  heard. No murmur.  Regular rate and rhythm. Abdomen: Soft, nontender, nondistended.  Bowel sounds are heard.   Extremities: No cyanosis, clubbing or peripheral edema.  Peripheral pulses are palpable. Psych: Alert, awake and oriented, mildly anxious, flat affect. CNS:  No cranial nerve deficits.  Power equal in all extremities.  Slow to respond. Skin: Warm and dry.  No rashes noted.   Data Reviewed: Laboratory data from last 24 hours reviewed.  Family Communication: none present.   Disposition: Status is: Inpatient Remains inpatient appropriate because: Need for IV antibiotic, on Solu-Cortef, hypotension,   Planned Discharge Destination: Home, will get PT evaluation.   Author: Joycelyn Das, MD 08/14/2023 9:48 AM  For on call review [email or web address].

## 2023-08-15 ENCOUNTER — Other Ambulatory Visit: Payer: Self-pay | Admitting: Adult Health

## 2023-08-15 DIAGNOSIS — R578 Other shock: Secondary | ICD-10-CM | POA: Diagnosis not present

## 2023-08-15 DIAGNOSIS — K2100 Gastro-esophageal reflux disease with esophagitis, without bleeding: Secondary | ICD-10-CM

## 2023-08-15 DIAGNOSIS — K7030 Alcoholic cirrhosis of liver without ascites: Secondary | ICD-10-CM | POA: Diagnosis not present

## 2023-08-15 DIAGNOSIS — F418 Other specified anxiety disorders: Secondary | ICD-10-CM | POA: Diagnosis not present

## 2023-08-15 DIAGNOSIS — G629 Polyneuropathy, unspecified: Secondary | ICD-10-CM

## 2023-08-15 DIAGNOSIS — E2740 Unspecified adrenocortical insufficiency: Secondary | ICD-10-CM | POA: Diagnosis not present

## 2023-08-15 DIAGNOSIS — K264 Chronic or unspecified duodenal ulcer with hemorrhage: Secondary | ICD-10-CM | POA: Diagnosis not present

## 2023-08-15 DIAGNOSIS — F10231 Alcohol dependence with withdrawal delirium: Secondary | ICD-10-CM | POA: Diagnosis not present

## 2023-08-15 DIAGNOSIS — D62 Acute posthemorrhagic anemia: Secondary | ICD-10-CM | POA: Diagnosis not present

## 2023-08-15 DIAGNOSIS — I255 Ischemic cardiomyopathy: Secondary | ICD-10-CM | POA: Diagnosis not present

## 2023-08-15 DIAGNOSIS — F1429 Cocaine dependence with unspecified cocaine-induced disorder: Secondary | ICD-10-CM | POA: Diagnosis not present

## 2023-08-15 LAB — BASIC METABOLIC PANEL
Anion gap: 13 (ref 5–15)
BUN: 11 mg/dL (ref 6–20)
CO2: 21 mmol/L — ABNORMAL LOW (ref 22–32)
Calcium: 9.2 mg/dL (ref 8.9–10.3)
Chloride: 106 mmol/L (ref 98–111)
Creatinine, Ser: 0.69 mg/dL (ref 0.61–1.24)
GFR, Estimated: >60 mL/min (ref >60)
Glucose, Bld: 170 mg/dL — ABNORMAL HIGH (ref 70–99)
Potassium: 4 mmol/L (ref 3.5–5.1)
Sodium: 140 mmol/L (ref 135–145)

## 2023-08-15 LAB — MAGNESIUM: Magnesium: 2.1 mg/dL (ref 1.7–2.4)

## 2023-08-15 LAB — CBC
HCT: 23.4 % — ABNORMAL LOW (ref 39.0–52.0)
Hemoglobin: 7.8 g/dL — ABNORMAL LOW (ref 13.0–17.0)
MCH: 31 pg (ref 26.0–34.0)
MCHC: 33.3 g/dL (ref 30.0–36.0)
MCV: 92.9 fL (ref 80.0–100.0)
Platelets: 104 10*3/uL — ABNORMAL LOW (ref 150–400)
RBC: 2.52 MIL/uL — ABNORMAL LOW (ref 4.22–5.81)
RDW: 18.6 % — ABNORMAL HIGH (ref 11.5–15.5)
WBC: 7.8 10*3/uL (ref 4.0–10.5)
nRBC: 0.3 % — ABNORMAL HIGH (ref 0.0–0.2)

## 2023-08-15 MED ORDER — FOLIC ACID 1 MG PO TABS: 1.0000 mg | ORAL_TABLET | Freq: Every day | ORAL | Status: DC

## 2023-08-15 MED ORDER — ADULT MULTIVITAMIN W/MINERALS CH
1.0000 | ORAL_TABLET | Freq: Every day | ORAL | Status: DC
  Administered 2023-08-15 – 2023-08-25 (×10): 1 via ORAL
  Filled 2023-08-15 (×10): qty 1

## 2023-08-15 MED ORDER — HYDROCORTISONE 5 MG PO TABS
5.0000 mg | ORAL_TABLET | Freq: Two times a day (BID) | ORAL | Status: DC
  Administered 2023-08-15 – 2023-08-25 (×19): 5 mg via ORAL
  Filled 2023-08-15 (×21): qty 1

## 2023-08-15 MED ORDER — THIAMINE HCL 100 MG/ML IJ SOLN
100.0000 mg | Freq: Every day | INTRAMUSCULAR | Status: DC
  Filled 2023-08-15: qty 2

## 2023-08-15 MED ORDER — THIAMINE MONONITRATE 100 MG PO TABS
100.0000 mg | ORAL_TABLET | Freq: Every day | ORAL | Status: DC
  Administered 2023-08-15 – 2023-08-25 (×10): 100 mg via ORAL
  Filled 2023-08-15 (×10): qty 1

## 2023-08-15 MED ORDER — LORAZEPAM 2 MG/ML IJ SOLN: 1.0000 mg | INTRAMUSCULAR | Status: DC | PRN

## 2023-08-15 MED ORDER — LORAZEPAM 2 MG/ML IJ SOLN: 0.0000 mg | Freq: Two times a day (BID) | INTRAMUSCULAR | Status: DC

## 2023-08-15 MED ORDER — LORAZEPAM 2 MG/ML IJ SOLN
0.0000 mg | Freq: Four times a day (QID) | INTRAMUSCULAR | Status: DC
  Administered 2023-08-15 – 2023-08-16 (×3): 1 mg via INTRAVENOUS
  Filled 2023-08-15 (×3): qty 1

## 2023-08-15 MED ORDER — LORAZEPAM 1 MG PO TABS
1.0000 mg | ORAL_TABLET | ORAL | Status: DC | PRN
  Administered 2023-08-15: 1 mg via ORAL
  Administered 2023-08-15: 2 mg via ORAL
  Administered 2023-08-17: 1 mg via ORAL
  Filled 2023-08-15 (×2): qty 1
  Filled 2023-08-15: qty 2

## 2023-08-15 MED ORDER — MIDODRINE HCL 5 MG PO TABS
5.0000 mg | ORAL_TABLET | Freq: Three times a day (TID) | ORAL | Status: DC
  Administered 2023-08-15 – 2023-08-16 (×5): 5 mg via ORAL
  Filled 2023-08-15 (×5): qty 1

## 2023-08-15 NOTE — Telephone Encounter (Signed)
Please confirm medication

## 2023-08-15 NOTE — Progress Notes (Addendum)
Physical Therapy Treatment Patient Details Name: Ethan Wells MRN: [id number] DOB: [birth year] Today's Date: 08/15/2023   History of Present Illness Pt is a 52 y.o. male with significant PMH of alcohol abuse, alcoholic liver cirrhosis with portal gastropathy, s/p TIPS, superior mesenteric artery stenosis, HTN, HLD, CAD, STEMI, dCHF, GERD, depression with anxiety, adrenal insufficiency (Addison's disease), migraine headache, cardiac arrest due to V-fib, recurrent GI bleeding, cocaine abuse, tobacco abuse, who presented to the ED with rectal bleeding.    PT Comments  Pt presents seated EOB eating lunch, 8/10 generalized body pain. He was able to perform sit<>stand with RW/CGA, noting some lightheaded/ dizziness with standing. Orthostatics assessed and overall unremarkable (defer to vitals section). Pt ambulated ~22ft with CGA and RW, but verbalized ambulation was difficult and very fatiguing. He would benefit from continued skilled therapy to address deficits in activity tolerance and maximize functional abilities.    If plan is discharge home, recommend the following: A little help with walking and/or transfers;Help with stairs or ramp for entrance;Direct supervision/assist for financial management;A little help with bathing/dressing/bathroom   Can travel by private vehicle        Equipment Recommendations  Other (comment) (TBD)    Recommendations for Other Services       Precautions / Restrictions Precautions Precautions: Fall Restrictions Weight Bearing Restrictions: No     Mobility  Bed Mobility                    Transfers                        Ambulation/Gait                   Stairs             Wheelchair Mobility     Tilt Bed    Modified Rankin (Stroke Patients Only)       Balance                                            Cognition                                                 Exercises      General Comments General comments (skin integrity, edema, etc.): Pt voiced some dizziness with standing, orthostatics assessed (seated: 116/74 (81) standing: 112/78 (96) 3 min: 124/67 (95))      Pertinent Vitals/Pain Pain Assessment Pain Location: generalized body aches    Home Living                          Prior Function            PT Goals (current goals can now be found in the care plan section) Acute Rehab PT Goals Patient Stated Goal: to go home Potential to Achieve Goals: Good Progress towards PT goals: Progressing toward goals    Frequency    Min 1X/week      PT Plan      Co-evaluation              AM-PAC PT "6 Clicks" Mobility   Outcome Measure  Help needed turning from your  back to your side while in a flat bed without using bedrails?: None Help needed moving from lying on your back to sitting on the side of a flat bed without using bedrails?: None Help needed moving to and from a bed to a chair (including a wheelchair)?: A Little Help needed standing up from a chair using your arms (e.g., wheelchair or bedside chair)?: A Little Help needed to walk in hospital room?: A Little Help needed climbing 3-5 steps with a railing? : A Little 6 Click Score: 20    End of Session Equipment Utilized During Treatment: Gait belt Activity Tolerance: Patient limited by fatigue Patient left: in bed;with call bell/phone within reach;with bed alarm set   PT Visit Diagnosis: Unsteadiness on feet (R26.81);Repeated falls (R29.6);History of falling (Z91.81);Pain Pain - Right/Left:  (generalized body aches) Pain - part of body:  (gross body)     Time: 1406-1430 PT Time Calculation (min) (ACUTE ONLY): 24 min  Charges:    $Therapeutic Activity: 23-37 mins PT General Charges $$ ACUTE PT VISIT: 1 Visit                    Woodfin Kiss, PT, SPT 3:46 PM,08/15/23

## 2023-08-15 NOTE — Progress Notes (Signed)
PHARMACY CONSULT NOTE - ELECTROLYTES  Pharmacy Consult for Electrolyte Monitoring and Replacement   Recent Labs: Height: 5\' 6"  (167.6 cm) Weight: 91.8 kg (202 lb 6.1 oz) IBW/kg (Calculated) : 63.8 Estimated Creatinine Clearance: 115.9 mL/min (by C-G formula based on SCr of 0.69 mg/dL).  Potassium (mmol/L)  Date Value  08/15/2023 4.0  09/06/2013 3.5   Magnesium (mg/dL)  Date Value  40/98/1191 2.1  03/09/2013 2.2   Calcium (mg/dL)  Date Value  47/82/9562 9.2   Calcium, Total (mg/dL)  Date Value  13/07/6577 8.9   Albumin (g/dL)  Date Value  46/96/2952 2.3 (L)  09/06/2013 4.0   Phosphorus (mg/dL)  Date Value  84/13/2440 2.8   Sodium (mmol/L)  Date Value  08/15/2023 140  09/06/2013 134 (L)   Corrected Ca: 10.56 mg/dL  Assessment  Ethan Wells is a 52 y.o. male presenting with acute GI bleed. PMH significant for alcohol abuse, alcoholic liver cirrhosis with portal gastropathy, s/p TIPS, superior mesenteric artery stenosis s/p stenting, HTN, HLD, CAD, STEMI, dCHF, GERD, depression with anxiety, adrenal insufficiency (Addison's disease), migraine headache, cardiac arrest due to V-fib, recurrent GI bleeding, cocaine abuse, tobacco abuse . Pharmacy has been consulted to monitor and replace electrolytes.  Diet: Thin fluids  Goal of Therapy: Electrolytes WNL  Plan:  Electrolytes WNL - no replacement needed at this time Check BMP and Phos with AM labs  Thank you for allowing pharmacy to be a part of this patient's care.   Elliot Gurney, PharmD, BCPS Clinical Pharmacist  08/15/2023 7:24 AM

## 2023-08-15 NOTE — Progress Notes (Addendum)
Progress Note   [name] LKG:[id number] DOB: [birth year] DOA: 08/08/2023     7 DOS: the patient was seen and examined on 08/15/2023   Brief hospital course:  52 y.o. male with past medical history of alcohol abuse, alcoholic liver cirrhosis with portal gastropathy, s/p TIPS, superior mesenteric artery stenosis s/p stenting, HTN, HLD, CAD, STEMI, diastolic CHF, GERD, depression with anxiety, adrenal insufficiency (Addison's disease), migraine headache, cardiac arrest due to V-fib, recurrent GI bleeding, cocaine abuse, tobacco abuse  was initially admitted to ICU on evening of 08/07/2023 with Hemorrhagic shock due to Acute GI bleed, requiring pressors for BP support.   Patient required transfusion of 2 units pRBC's.  Gastroenterology was consulted and patient underwent EGD with findings of non-bleeding duodenal ulcers, one area treated with Purastat for concern of a healing, recently bleeding vessel. TRH assumed care 08/10/2023.  Patient's BP soft but stable since weaned off pressors 8/24.  Transferred to med/tele floor. Hospital course complicated by ongoing hypotension, acute withdrawal syndrome with encephalopathy.    Assessment and Plan:  #Hypovolemic/Hemorrhagic Shock Received IV fluids and PRBC.  On midodrine, Solu-Cortef. Goal MAP more than 60.  Diuresis on hold.  Hemoglobin today at 7.8 from 7.6 < 7.7.  Has remained stable.  Will try to decrease the dose of midodrine from 10 mg to 5 mg 3 times daily from today and change Solu-Cortef to p.o.   #Acute blood loss anemia in the setting of acute GI bleed #Thrombocytopenia Alcoholic Cirrhosis s/p TIPS 4/27, esophageal varices,   CT Angio GI Bleed  showed Active GI bleed in the region of the gastric antrum/pylorus and proximal duodenum,  Cirrhosis of liver with patent appearing TIPS. GI Dr. Mia Creek and IR oncall Dr. Fredia Sorrow both reviewed and agreed does not look like a variceal bleed by CTA.   GI following the patient and diet has  been advanced.  Continue PPI twice daily.  GI recommend 7-day course of antibiotic. On IV rocephin..  Continue to hold Plavix and avoid all NSAIDs.  Continue rifaximin and lactulose.  Transfuse for hemoglobin less than 7.  Latest hemoglobin of 7.8.   #Acute Metabolic Encephalopathy  # Acute withdrawal syndrome # Hepatic Encephalopathy On 08/11/23 patient developed progressive withdrawal symptoms with ammonia of 49.  Had CIWA of 15 on 08/13/2023.  Has been resumed on CIWA protocol as needed Ativan.  Librium has been discontinued.  Continue lactulose and rifaximin.  No overt withdrawal symptoms at this time.  Alcoholic cirrhosis with Portal gastropathy S/p TIPS 4/27 GI for the patient during hospitalization.  Continue IV Rocephin, rifaximin and lactulose.  Chronic diastolic congestive heart failure. Continue to monitor intake and output charting Daily weights.  Patient is negative balance for 242 mL.  #Non Anion Gap Metabolic Acidosis due Lactic Acidosis #Hypokalemia #Hypophosphatemia Mild hyponatremia.  Wert within normal limits potassium magnesium and phosphorus has normalized   #Hx adrenal insufficiency  On IV Solu-Cortef.  Will change to oral 5 mg milligram twice daily..  A.m. cortisol of 1.9.  Thyroid function within normal range. Decreased midodrine dose to 5 mg 3 times daily.  Blood pressure borderline low.  Will need to closely monitor and wean if possible.   #Hx polysubstance abuse including EtOH -UDS+Cocaine, Last Drink on 08/06/23 --continue thiamine and folate    Subjective:   Today, patient was seen and examined at bedside.  Denies any tremors, sweating, anxiety, hallucination.  Has been having bowel movements without any blood.  Complains of generalized weakness and fatigue.  Physical Exam: Vitals:   08/15/23 0028 08/15/23 0236 08/15/23 0358 08/15/23 0726  BP: (!) 103/58 (!) 102/56 (!) 100/54 (!) 103/57  Pulse: 78 86 85 80  Resp: 18 18 20 17   Temp: 98.5 F (36.9 C) 97.9  F (36.6 C) 97.6 F (36.4 C) 98.2 F (36.8 C)  TempSrc: Oral Oral Oral   SpO2: 98% 99% 98% 99%  Weight:      Height:       Body mass index is 32.67 kg/m.   General: Obese built, not in obvious distress, appears chronically ill, sitting up on the commode. HENT:   No scleral pallor or icterus noted. Oral mucosa is moist.  Chest:  Diminished breath sounds bilaterally. No crackles or wheezes.  CVS: S1 &S2 heard. No murmur.  Regular rate and rhythm. Abdomen: Soft, nontender, obese abdomen, nontender bowel sounds are heard.   Extremities: No cyanosis, clubbing with trace peripheral edema  Psych: Alert, awake and oriented, mildly anxious, flat affect. CNS:  No cranial nerve deficits.  Power equal in all extremities.  Slow to respond. Skin: Warm and dry.  No rashes noted.   Data Reviewed: Laboratory and imaging data from last 24 hours reviewed.  Family Communication: none present at bedside.  Disposition: Status is: Inpatient  Remains inpatient appropriate because: Need for IV antibiotic, on Solu-Cortef, hypotension,   Planned Discharge Destination: Home, in 1 to 2 days.   Author: Joycelyn Das, MD 08/15/2023 12:23 PM  For on call review [email or web address].

## 2023-08-15 NOTE — Plan of Care (Signed)
  Problem: Education: Goal: Knowledge of General Education information will improve Description: Including pain rating scale, medication(s)/side effects and non-pharmacologic comfort measures 08/15/2023 0341 by Jorge Ny, RN Outcome: Progressing 08/15/2023 0341 by Jorge Ny, RN Outcome: Progressing   Problem: Health Behavior/Discharge Planning: Goal: Ability to manage health-related needs will improve 08/15/2023 0341 by Jorge Ny, RN Outcome: Progressing 08/15/2023 0341 by Jorge Ny, RN Outcome: Progressing   Problem: Clinical Measurements: Goal: Ability to maintain clinical measurements within normal limits will improve 08/15/2023 0341 by Jorge Ny, RN Outcome: Progressing 08/15/2023 0341 by Jorge Ny, RN Outcome: Progressing Goal: Will remain free from infection 08/15/2023 0341 by Jorge Ny, RN Outcome: Progressing 08/15/2023 0341 by Jorge Ny, RN Outcome: Progressing Goal: Diagnostic test results will improve 08/15/2023 0341 by Jorge Ny, RN Outcome: Progressing 08/15/2023 0341 by Jorge Ny, RN Outcome: Progressing Goal: Respiratory complications will improve 08/15/2023 0341 by Jorge Ny, RN Outcome: Progressing 08/15/2023 0341 by Jorge Ny, RN Outcome: Progressing Goal: Cardiovascular complication will be avoided 08/15/2023 0341 by Jorge Ny, RN Outcome: Progressing 08/15/2023 0341 by Jorge Ny, RN Outcome: Progressing   Problem: Activity: Goal: Risk for activity intolerance will decrease 08/15/2023 0341 by Jorge Ny, RN Outcome: Progressing 08/15/2023 0341 by Jorge Ny, RN Outcome: Progressing   Problem: Nutrition: Goal: Adequate nutrition will be maintained 08/15/2023 0341 by Jorge Ny, RN Outcome: Progressing 08/15/2023 0341 by Jorge Ny, RN Outcome: Progressing   Problem: Coping: Goal: Level of anxiety will decrease 08/15/2023  0341 by Jorge Ny, RN Outcome: Progressing 08/15/2023 0341 by Jorge Ny, RN Outcome: Progressing   Problem: Elimination: Goal: Will not experience complications related to bowel motility 08/15/2023 0341 by Jorge Ny, RN Outcome: Progressing 08/15/2023 0341 by Jorge Ny, RN Outcome: Progressing Goal: Will not experience complications related to urinary retention 08/15/2023 0341 by Jorge Ny, RN Outcome: Progressing 08/15/2023 0341 by Jorge Ny, RN Outcome: Progressing   Problem: Pain Managment: Goal: General experience of comfort will improve 08/15/2023 0341 by Jorge Ny, RN Outcome: Progressing 08/15/2023 0341 by Jorge Ny, RN Outcome: Progressing   Problem: Safety: Goal: Ability to remain free from injury will improve 08/15/2023 0341 by Jorge Ny, RN Outcome: Progressing 08/15/2023 0341 by Jorge Ny, RN Outcome: Progressing   Problem: Skin Integrity: Goal: Risk for impaired skin integrity will decrease 08/15/2023 0341 by Jorge Ny, RN Outcome: Progressing 08/15/2023 0341 by Jorge Ny, RN Outcome: Progressing

## 2023-08-15 NOTE — Progress Notes (Signed)
Occupational Therapy Treatment Patient Details Name: Ethan Wells MRN: [id number] DOB: 1971/10/03 Today's Date: 08/15/2023   History of present illness Pt is a 52 y.o. male with significant PMH of alcohol abuse, alcoholic liver cirrhosis with portal gastropathy, s/p TIPS, superior mesenteric artery stenosis, HTN, HLD, CAD, STEMI, dCHF, GERD, depression with anxiety, adrenal insufficiency (Addison's disease), migraine headache, cardiac arrest due to V-fib, recurrent GI bleeding, cocaine abuse, tobacco abuse, who presented to the ED with rectal bleeding.   OT comments  Upon entering the room, pt supine in bed and having just finished breakfast. Pt remembers this therapist from yesterday and is agreeable to OT intervention. Pt performing bed mobility without assistance to EOB. He stands with CGA and use of RW at sink for oral hygiene. Pt then ambulates to bathroom for toileting needs with min guard for balance. Pt returning back to bed secondary to fatigue but overall pleasant and cooperative. He does refuse bed alarm and RN notified. Call bell and all needed items within reach. Pt making progress towards goals.       If plan is discharge home, recommend the following:  A little help with walking and/or transfers;A little help with bathing/dressing/bathroom;Assistance with cooking/housework;Assist for transportation;Help with stairs or ramp for entrance;Direct supervision/assist for financial management;Direct supervision/assist for medications management   Equipment Recommendations  None recommended by OT       Precautions / Restrictions Precautions Precautions: Fall       Mobility Bed Mobility Overal bed mobility: Modified Independent Bed Mobility: Supine to Sit, Sit to Supine     Supine to sit: Modified independent (Device/Increase time) Sit to supine: Modified independent (Device/Increase time)        Transfers Overall transfer level: Needs assistance Equipment used:  Rolling walker (2 wheels) Transfers: Sit to/from Stand Sit to Stand: Contact guard assist                 Balance Overall balance assessment: Needs assistance Sitting-balance support: Single extremity supported, Feet supported Sitting balance-Leahy Scale: Good     Standing balance support: Reliant on assistive device for balance, During functional activity, Bilateral upper extremity supported Standing balance-Leahy Scale: Fair                             ADL either performed or assessed with clinical judgement   ADL Overall ADL's : Needs assistance/impaired     Grooming: Wash/dry hands;Wash/dry face;Oral care;Standing;Contact guard assist                   Toilet Transfer: Contact guard assist;Rolling walker (2 wheels)                  Extremity/Trunk Assessment Upper Extremity Assessment Upper Extremity Assessment: Generalized weakness   Lower Extremity Assessment Lower Extremity Assessment: Generalized weakness        Vision Patient Visual Report: No change from baseline            Cognition Arousal: Alert Behavior During Therapy: Flat affect Overall Cognitive Status: No family/caregiver present to determine baseline cognitive functioning                                 General Comments: Pt remembers this therapist from yesterday. He follows cues with increased time to process and is pleasant over all.  Pertinent Vitals/ Pain       Pain Assessment Pain Assessment: No/denies pain         Frequency  Min 1X/week        Progress Toward Goals  OT Goals(current goals can now be found in the care plan section)  Progress towards OT goals: Progressing toward goals  Acute Rehab OT Goals OT Goal Formulation: With patient Time For Goal Achievement: 08/29/23 Potential to Achieve Goals: Fair  Plan         AM-PAC OT "6 Clicks" Daily Activity     Outcome Measure   Help from another  person eating meals?: None Help from another person taking care of personal grooming?: None Help from another person toileting, which includes using toliet, bedpan, or urinal?: A Little Help from another person bathing (including washing, rinsing, drying)?: A Little Help from another person to put on and taking off regular upper body clothing?: None Help from another person to put on and taking off regular lower body clothing?: A Little 6 Click Score: 21    End of Session Equipment Utilized During Treatment: Rolling walker (2 wheels)  OT Visit Diagnosis: Unsteadiness on feet (R26.81);Repeated falls (R29.6);Muscle weakness (generalized) (M62.81)   Activity Tolerance Patient tolerated treatment well   Patient Left in bed;with call bell/phone within reach;with bed alarm set   Nurse Communication Mobility status        Time: 1000-1017 OT Time Calculation (min): 17 min  Charges: OT General Charges $OT Visit: 1 Visit OT Treatments $Self Care/Home Management : 8-22 mins  Jackquline Denmark, MS, OTR/L , CBIS ascom [phone]  08/15/23, 1:57 PM

## 2023-08-15 NOTE — Discharge Instructions (Signed)

## 2023-08-16 DIAGNOSIS — K264 Chronic or unspecified duodenal ulcer with hemorrhage: Secondary | ICD-10-CM | POA: Diagnosis not present

## 2023-08-16 DIAGNOSIS — K7682 Hepatic encephalopathy: Secondary | ICD-10-CM | POA: Diagnosis not present

## 2023-08-16 DIAGNOSIS — G9341 Metabolic encephalopathy: Secondary | ICD-10-CM | POA: Diagnosis not present

## 2023-08-16 DIAGNOSIS — F10231 Alcohol dependence with withdrawal delirium: Secondary | ICD-10-CM | POA: Diagnosis not present

## 2023-08-16 DIAGNOSIS — I255 Ischemic cardiomyopathy: Secondary | ICD-10-CM | POA: Diagnosis not present

## 2023-08-16 LAB — CBC
HCT: 23.3 % — ABNORMAL LOW (ref 39.0–52.0)
Hemoglobin: 7.5 g/dL — ABNORMAL LOW (ref 13.0–17.0)
MCH: 30.9 pg (ref 26.0–34.0)
MCHC: 32.2 g/dL (ref 30.0–36.0)
MCV: 95.9 fL (ref 80.0–100.0)
Platelets: 100 10*3/uL — ABNORMAL LOW (ref 150–400)
RBC: 2.43 MIL/uL — ABNORMAL LOW (ref 4.22–5.81)
RDW: 17.8 % — ABNORMAL HIGH (ref 11.5–15.5)
WBC: 5.9 10*3/uL (ref 4.0–10.5)
nRBC: 0 % (ref 0.0–0.2)

## 2023-08-16 LAB — COMPREHENSIVE METABOLIC PANEL
ALT: 37 U/L (ref 0–44)
AST: 47 U/L — ABNORMAL HIGH (ref 15–41)
Albumin: 2.1 g/dL — ABNORMAL LOW (ref 3.5–5.0)
Alkaline Phosphatase: 118 U/L (ref 38–126)
Anion gap: 7 (ref 5–15)
BUN: 16 mg/dL (ref 6–20)
CO2: 21 mmol/L — ABNORMAL LOW (ref 22–32)
Calcium: 8.4 mg/dL — ABNORMAL LOW (ref 8.9–10.3)
Chloride: 110 mmol/L (ref 98–111)
Creatinine, Ser: 0.77 mg/dL (ref 0.61–1.24)
GFR, Estimated: >60 mL/min (ref >60)
Glucose, Bld: 140 mg/dL — ABNORMAL HIGH (ref 70–99)
Potassium: 3.5 mmol/L (ref 3.5–5.1)
Sodium: 138 mmol/L (ref 135–145)
Total Bilirubin: 1.5 mg/dL — ABNORMAL HIGH (ref 0.3–1.2)
Total Protein: 4.8 g/dL — ABNORMAL LOW (ref 6.5–8.1)

## 2023-08-16 LAB — MAGNESIUM: Magnesium: 2.2 mg/dL (ref 1.7–2.4)

## 2023-08-16 MED ORDER — OXYCODONE HCL 5 MG PO TABS
5.0000 mg | ORAL_TABLET | Freq: Two times a day (BID) | ORAL | Status: DC | PRN
  Administered 2023-08-17 – 2023-08-20 (×6): 5 mg via ORAL
  Filled 2023-08-16 (×7): qty 1

## 2023-08-16 MED ORDER — LACTULOSE 10 GM/15ML PO SOLN
30.0000 g | Freq: Three times a day (TID) | ORAL | Status: DC
  Administered 2023-08-16 – 2023-08-25 (×25): 30 g via ORAL
  Filled 2023-08-16 (×25): qty 60

## 2023-08-16 NOTE — TOC Progression Note (Signed)
Transition of Care [facility]) - Progression Note    Patient Details  Name: Ethan Wells MRN: [id number] Date of Birth: 1971/02/17  Transition of Care [facility]) CM/SW Contact  Allena Katz, LCSW Phone Number: 08/16/2023, 10:58 AM  Clinical Narrative:   CSW spoke with pt about doing outpatient therapy pt has declined at this time.     Expected Discharge Plan: Home/Self Care Barriers to Discharge: Continued Medical Work up  Expected Discharge Plan and Services     Post Acute Care Choice: NA Living arrangements for the past 2 months: Homeless                                       Social Determinants of Health (SDOH) Interventions SDOH Screenings   Food Insecurity: No Food Insecurity (08/09/2023)  Housing: Patient Unable To Answer (08/09/2023)  Transportation Needs: No Transportation Needs (08/09/2023)  Utilities: Not At Risk (08/09/2023)  Alcohol Screen: Medium Risk (10/01/2022)  Tobacco Use: High Risk (08/10/2023)    Readmission Risk Interventions    08/11/2023   10:35 AM 02/24/2023    3:12 PM 01/17/2023    3:38 PM  Readmission Risk Prevention Plan  Transportation Screening Complete Complete Complete  Medication Review [employer]) Complete Complete Complete  PCP or Specialist appointment within 3-5 days of discharge Complete    SW Recovery Care/Counseling Consult Complete  Not Complete  SW Consult Not Complete Comments   lethargic  Palliative Care Screening Not Applicable Not Applicable Not Applicable  Skilled Nursing Facility Not Applicable Not Applicable Not Applicable

## 2023-08-16 NOTE — Plan of Care (Signed)

## 2023-08-16 NOTE — Progress Notes (Signed)
Progress Note   [name] ZOX:[id number] DOB: 1971-07-09 DOA: 08/08/2023     8 DOS: the patient was seen and examined on 08/16/2023   Brief hospital course:  52 y.o. male with past medical history of alcohol abuse, alcoholic liver cirrhosis with portal gastropathy, s/p TIPS, superior mesenteric artery stenosis s/p stenting, HTN, HLD, CAD, STEMI, diastolic CHF, GERD, depression with anxiety, adrenal insufficiency (Addison's disease), migraine headache, cardiac arrest due to V-fib, recurrent GI bleeding, cocaine abuse, tobacco abuse  was initially admitted to ICU on evening of 08/07/2023 with Hemorrhagic shock due to Acute GI bleed, requiring pressors for BP support.   Patient required transfusion of 2 units pRBC's.  Gastroenterology was consulted and patient underwent EGD with findings of non-bleeding duodenal ulcers, one area treated with Purastat for concern of a healing, recently bleeding vessel. TRH assumed care 08/10/2023.  Patient's BP soft but stable since weaned off pressors 8/24.  Transferred to med/tele floor. Hospital course complicated by ongoing hypotension, acute withdrawal syndrome with encephalopathy.    8/31: Increased lactulose due to ammonia trending up  Assessment and Plan:  #Hypovolemic/Hemorrhagic Shock Received IV fluids and PRBC.  On midodrine, Solu-Cortef. Goal MAP more than 60.  Diuresis on hold.  Hemoglobin today at 7.5 has been slowly trending down.  Midodrine 5 mg 3 times daily from today and Cortef 5 mg p.o. twice daily   #Acute blood loss anemia in the setting of acute GI bleed #Thrombocytopenia Alcoholic Cirrhosis s/p TIPS 4/27, esophageal varices,   CT Angio GI Bleed  showed Active GI bleed in the region of the gastric antrum/pylorus and proximal duodenum,  Cirrhosis of liver with patent appearing TIPS. GI Dr. Mia Creek and IR oncall Dr. Fredia Sorrow both reviewed and agreed does not look like a variceal bleed by CTA.   GI following the patient and diet  has been advanced.  Continue PPI twice daily.  GI recommend 7-day course of antibiotic.  Completed course of IV Rocephin continue to hold Plavix and avoid all NSAIDs.  Continue rifaximin and lactulose.  Transfuse for hemoglobin less than 7.  Latest hemoglobin of 7.5   #Acute Metabolic Encephalopathy  # Acute withdrawal syndrome # Hepatic Encephalopathy ammonia of 49-> 56.  Continue CIWA protocol as needed Ativan.  Librium has been discontinued.  Continue rifaximin, will increase lactulose due to ammonia trending up.  Will recheck ammonia in the morning.  No overt withdrawal symptoms at this time.  Alcoholic cirrhosis with Portal gastropathy S/p TIPS 4/27 GI for the patient during hospitalization.  Continue Rifaximin and lactulose.  Chronic diastolic congestive heart failure. Continue to monitor intake and output charting Daily weights.   #Non Anion Gap Metabolic Acidosis due Lactic Acidosis #Hypokalemia #Hypophosphatemia Mild hyponatremia.  Electrolytes replaced   #Hx adrenal insufficiency  On Cortef.   A.m. cortisol of 1.9.  Thyroid function within normal range.  Continue midodrine dose to 5 mg 3 times daily.  Blood pressure borderline low.  Will need to closely monitor and wean if possible.   #Hx polysubstance abuse including EtOH -UDS+Cocaine, Last Drink on 08/06/23 --continue thiamine and folate    Subjective:   Seems lethargic and confused, complains of generalized weakness and fatigue.  Physical Exam: Vitals:   08/16/23 0404 08/16/23 0500 08/16/23 0826 08/16/23 1314  BP: 99/61  104/68 (!) 100/54  Pulse: 88  78 91  Resp: 16  20   Temp: 98 F (36.7 C)  98.6 F (37 C)   TempSrc: Oral  Oral   SpO2:  99%  100% 99%  Weight:  105.7 kg    Height:       Body mass index is 37.61 kg/m.   General: Obese built, not in obvious distress, appears chronically ill, sleeping in the bed HENT:   No scleral pallor or icterus noted. Oral mucosa is moist.  Chest:  Diminished breath  sounds bilaterally. No crackles or wheezes.  CVS: S1 &S2 heard. No murmur.  Regular rate and rhythm. Abdomen: Soft, nontender, obese abdomen, nontender bowel sounds are heard.   Extremities: No cyanosis, clubbing with trace peripheral edema  Psych: Seems somewhat lethargic and confused. CNS:  No cranial nerve deficits.  Power equal in all extremities.  Slow to respond. Skin: Warm and dry.  No rashes noted.   Data Reviewed: Laboratory and imaging data from last 24 hours reviewed.  Family Communication: none present at bedside.  Disposition: Status is: Inpatient  Remains inpatient appropriate because: Waiting on mental status stabilization, on Cortef, hypotension,   Planned Discharge Destination: Home, in 1 to 2 days. Time spent 35 minutes  Author: Delfino Lovett, MD 08/16/2023 2:29 PM  For on call review [email or web address].

## 2023-08-16 NOTE — Progress Notes (Signed)
PHARMACY CONSULT NOTE - ELECTROLYTES  Pharmacy Consult for Electrolyte Monitoring and Replacement   Recent Labs: Height: 5\' 6"  (167.6 cm) Weight: 105.7 kg (233 lb 0.4 oz) IBW/kg (Calculated) : 63.8 Estimated Creatinine Clearance: 124.5 mL/min (by C-G formula based on SCr of 0.77 mg/dL).  Potassium (mmol/L)  Date Value  08/16/2023 3.5  09/06/2013 3.5   Magnesium (mg/dL)  Date Value  78/29/5621 2.2  03/09/2013 2.2   Calcium (mg/dL)  Date Value  30/86/5784 8.4 (L)   Calcium, Total (mg/dL)  Date Value  69/62/9528 8.9   Albumin (g/dL)  Date Value  41/32/4401 2.1 (L)  09/06/2013 4.0   Phosphorus (mg/dL)  Date Value  02/72/5366 2.8   Sodium (mmol/L)  Date Value  08/16/2023 138  09/06/2013 134 (L)   Corrected Ca: 10.56 mg/dL  Assessment  Ethan Wells is a 52 y.o. male presenting with acute GI bleed. PMH significant for alcohol abuse, alcoholic liver cirrhosis with portal gastropathy, s/p TIPS, superior mesenteric artery stenosis s/p stenting, HTN, HLD, CAD, STEMI, dCHF, GERD, depression with anxiety, adrenal insufficiency (Addison's disease), migraine headache, cardiac arrest due to V-fib, recurrent GI bleeding, cocaine abuse, tobacco abuse . Pharmacy has been consulted to monitor and replace electrolytes.  Diet: Thin fluids  Goal of Therapy: Electrolytes WNL  Plan:  Electrolytes WNL - no replacement needed at this time Check BMP and Phos with AM labs  Thank you for allowing pharmacy to be a part of this patient's care.   Bettey Costa, PharmD Clinical Pharmacist 08/16/2023 7:50 AM

## 2023-08-17 ENCOUNTER — Inpatient Hospital Stay: Payer: Medicare HMO

## 2023-08-17 DIAGNOSIS — R601 Generalized edema: Secondary | ICD-10-CM

## 2023-08-17 DIAGNOSIS — R109 Unspecified abdominal pain: Secondary | ICD-10-CM | POA: Diagnosis not present

## 2023-08-17 DIAGNOSIS — K264 Chronic or unspecified duodenal ulcer with hemorrhage: Secondary | ICD-10-CM | POA: Diagnosis not present

## 2023-08-17 LAB — COMPREHENSIVE METABOLIC PANEL
ALT: 45 U/L — ABNORMAL HIGH (ref 0–44)
AST: 63 U/L — ABNORMAL HIGH (ref 15–41)
Albumin: 2.2 g/dL — ABNORMAL LOW (ref 3.5–5.0)
Alkaline Phosphatase: 122 U/L (ref 38–126)
Anion gap: 6 (ref 5–15)
BUN: 17 mg/dL (ref 6–20)
CO2: 20 mmol/L — ABNORMAL LOW (ref 22–32)
Calcium: 8 mg/dL — ABNORMAL LOW (ref 8.9–10.3)
Chloride: 111 mmol/L (ref 98–111)
Creatinine, Ser: 0.67 mg/dL (ref 0.61–1.24)
GFR, Estimated: >60 mL/min (ref >60)
Glucose, Bld: 104 mg/dL — ABNORMAL HIGH (ref 70–99)
Potassium: 3.9 mmol/L (ref 3.5–5.1)
Sodium: 137 mmol/L (ref 135–145)
Total Bilirubin: 1.4 mg/dL — ABNORMAL HIGH (ref 0.3–1.2)
Total Protein: 4.6 g/dL — ABNORMAL LOW (ref 6.5–8.1)

## 2023-08-17 LAB — CBC
HCT: 26.5 % — ABNORMAL LOW (ref 39.0–52.0)
Hemoglobin: 8.5 g/dL — ABNORMAL LOW (ref 13.0–17.0)
MCH: 30.4 pg (ref 26.0–34.0)
MCHC: 32.1 g/dL (ref 30.0–36.0)
MCV: 94.6 fL (ref 80.0–100.0)
Platelets: 112 10*3/uL — ABNORMAL LOW (ref 150–400)
RBC: 2.8 MIL/uL — ABNORMAL LOW (ref 4.22–5.81)
RDW: 17.8 % — ABNORMAL HIGH (ref 11.5–15.5)
WBC: 7.9 10*3/uL (ref 4.0–10.5)
nRBC: 0 % (ref 0.0–0.2)

## 2023-08-17 LAB — HEMOGLOBIN AND HEMATOCRIT, BLOOD
HCT: 26.2 % — ABNORMAL LOW (ref 39.0–52.0)
HCT: 27.5 % — ABNORMAL LOW (ref 39.0–52.0)
Hemoglobin: 8.4 g/dL — ABNORMAL LOW (ref 13.0–17.0)
Hemoglobin: 8.7 g/dL — ABNORMAL LOW (ref 13.0–17.0)

## 2023-08-17 LAB — PHOSPHORUS: Phosphorus: 4.7 mg/dL — ABNORMAL HIGH (ref 2.5–4.6)

## 2023-08-17 LAB — PREPARE RBC (CROSSMATCH)

## 2023-08-17 LAB — MAGNESIUM: Magnesium: 2.1 mg/dL (ref 1.7–2.4)

## 2023-08-17 LAB — AMMONIA: Ammonia: 33 umol/L (ref 9–35)

## 2023-08-17 MED ORDER — SODIUM CHLORIDE 0.9% IV SOLUTION
Freq: Once | INTRAVENOUS | Status: AC
  Administered 2023-08-19: 1000 mL via INTRAVENOUS

## 2023-08-17 MED ORDER — SODIUM CHLORIDE 0.9 % IV BOLUS
500.0000 mL | Freq: Once | INTRAVENOUS | Status: AC
  Administered 2023-08-17: 500 mL via INTRAVENOUS

## 2023-08-17 MED ORDER — MIDODRINE HCL 5 MG PO TABS
10.0000 mg | ORAL_TABLET | Freq: Three times a day (TID) | ORAL | Status: DC
  Administered 2023-08-17 – 2023-08-25 (×24): 10 mg via ORAL
  Filled 2023-08-17 (×24): qty 2

## 2023-08-17 MED ORDER — HYDROCORTISONE SOD SUC (PF) 100 MG IJ SOLR
100.0000 mg | Freq: Once | INTRAMUSCULAR | Status: AC
  Administered 2023-08-17: 100 mg via INTRAVENOUS
  Filled 2023-08-17: qty 2

## 2023-08-17 MED ORDER — ALBUMIN HUMAN 25 % IV SOLN
25.0000 g | Freq: Four times a day (QID) | INTRAVENOUS | Status: AC
  Administered 2023-08-17 – 2023-08-18 (×2): 25 g via INTRAVENOUS
  Filled 2023-08-17 (×3): qty 100

## 2023-08-17 NOTE — Progress Notes (Addendum)
       CROSS COVER NOTE  NAME: Ethan Wells MRN: [id number] DOB : 11-24-1971    Concern as stated by nurse / staff   BP 88/50 I rechecked it, manual BP is 90/50 MAP 63. On midodrine, Solu-Cortef. Goal MAP more than 60. Patient states he feels slightly lightheaded, But had to wake him up for the repeat BP. I am notifying you per the order to notify physician if DBP <60  1  12:07 AM CK BP 86/44 MAP 58 after bolus. Patient also has +2 pitting edema on abdomen +1 BLE History diastolic CHF.      Pertinent findings on chart review: Repeated calls during night for low BP, transiently responded to a small bolus Last progress note and current meds reviewed Patient on midodrine 5 mg 3 times daily and is on hydrocortisone 5 mg twice daily  Patient assessment Review of Systems  Cardiovascular:  Positive for leg swelling.  Neurological:  Positive for weakness.  All other systems reviewed and are negative.     08/17/2023    4:42 AM 08/17/2023    3:51 AM 08/17/2023    3:36 AM  Vitals with BMI  Weight   211 lbs 10 oz  BMI   34.18  Systolic 93 86   Diastolic 52 44   Pulse 88 86     Physical Exam Vitals and nursing note reviewed.  Constitutional:      General: He is not in acute distress. HENT:     Head: Normocephalic and atraumatic.  Cardiovascular:     Rate and Rhythm: Normal rate and regular rhythm.     Heart sounds: Normal heart sounds.  Pulmonary:     Effort: Pulmonary effort is normal.     Breath sounds: Normal breath sounds.  Abdominal:     Palpations: Abdomen is soft.     Tenderness: There is no abdominal tenderness.  Musculoskeletal:     Comments: anasarca  Neurological:     Mental Status: Mental status is at baseline.      Assessment and  Interventions   Assessment:  Hypotension, likely secondary to adrenal insufficiency  Plan: Hold off on additional fluids Increase midodrine to 10 tid Hydrocortisone IV 100mg  x1 and if helpful, consider  uptitrating oral dose or continue with IV Continue to monitor Discussed with bedside nurse       CRITICAL CARE Performed by: Andris Baumann   Total critical care time: 30 minutes  Critical care time was exclusive of separately billable procedures and treating other patients.  Critical care was necessary to treat or prevent imminent or life-threatening deterioration.  Critical care was time spent personally by me on the following activities: development of treatment plan with patient and/or surrogate as well as nursing, discussions with consultants, evaluation of patient's response to treatment, examination of patient, obtaining history from patient or surrogate, ordering and performing treatments and interventions, ordering and review of laboratory studies, ordering and review of radiographic studies, pulse oximetry and re-evaluation of patient's condition.

## 2023-08-17 NOTE — Progress Notes (Addendum)
Progress Note   [name] NUU:[id number] DOB: 09/20/1971 DOA: 08/08/2023     9 DOS: the patient was seen and examined on 08/17/2023   Brief hospital course:  52 y.o. male with past medical history of alcohol abuse, alcoholic liver cirrhosis with portal gastropathy, s/p TIPS, superior mesenteric artery stenosis s/p stenting, HTN, HLD, CAD, STEMI, diastolic CHF, GERD, depression with anxiety, adrenal insufficiency (Addison's disease), migraine headache, cardiac arrest due to V-fib, recurrent GI bleeding, cocaine abuse, tobacco abuse  was initially admitted to ICU on evening of 08/07/2023 with Hemorrhagic shock due to Acute GI bleed, requiring pressors for BP support.   Patient required transfusion of 2 units pRBC's.  Gastroenterology was consulted and patient underwent EGD with findings of non-bleeding duodenal ulcers, one area treated with Purastat for concern of a healing, recently bleeding vessel. TRH assumed care 08/10/2023.  Patient's BP soft but stable since weaned off pressors 8/24.  Transferred to med/tele floor. Hospital course complicated by ongoing hypotension, acute withdrawal syndrome with encephalopathy.  Lactulose dose was increased on 08/16/2023.  08/17/2023: The patient was seen and examined at bedside.  He is alert and oriented x 3.  Reports having moderate to severe pain in his left flank.  Denies any overt bleeding at this time.  Serial H&H every 6 hours.  Assessment and Plan:  #Hypovolemic/Hemorrhagic Shock Received IV fluids and PRBC.  On midodrine, Solu-Cortef. Goal MAP more than 60.  Diuresis on hold.  Hemoglobin today at 7.5 has been slowly trending down.  Midodrine 10 mg 3 times daily from today and Cortef 5 mg p.o. twice daily IV albumin 25 g every 6 hours x 2 doses. Continue to maintain MAP greater than 65   #Acute blood loss anemia in the setting of acute GI bleed #Thrombocytopenia Alcoholic Cirrhosis s/p TIPS 4/27, esophageal varices, duodenal ulcers seen  on EGD  CT Angio GI Bleed  showed Active GI bleed in the region of the gastric antrum/pylorus and proximal duodenum,  Cirrhosis of liver with patent appearing TIPS. GI Dr. Mia Creek and IR oncall Dr. Fredia Sorrow both reviewed and agreed does not look like a variceal bleed by CTA.   GI following the patient and diet has been advanced.  Continue PPI twice daily.  GI recommend 7-day course of antibiotic.  Completed course of IV Rocephin continue to hold Plavix and avoid all NSAIDs.  Continue rifaximin and lactulose.  Transfuse for hemoglobin less than 7.  Latest hemoglobin of 7.5 Serial H&H every 6 hours x 3.  Transfuse hemoglobin less than 8.0. 1 unit PRBCs ordered to be transfused on 08/17/2023.  Left flank pain and induration CT scan revealed possible cluster of kidney stones involving left kidney Will obtain left renal ultrasound to further assess Continue as needed analgesics.   # Resolved acute Metabolic Encephalopathy  # Acute withdrawal syndrome # Hepatic Encephalopathy ammonia of 49-> 56.  Continue CIWA protocol as needed Ativan.  Librium has been discontinued.  Continue rifaximin and lactulose.  Ammonia level has normalized on 08/17/2023.  Mentation is back to baseline.  Alcoholic cirrhosis with Portal gastropathy S/p TIPS 4/27 GI for the patient during hospitalization.  Continue Rifaximin and lactulose.  Chronic diastolic congestive heart failure. Continue to monitor intake and output charting Daily weights.   #Non Anion Gap Metabolic Acidosis due Lactic Acidosis #Hypokalemia #Hypophosphatemia Mild hyponatremia.  Electrolytes replaced   #Hx adrenal insufficiency  On Cortef.   A.m. cortisol of 1.9.  Thyroid function within normal range.  Continue midodrine dose to  10 mg 3 times daily.  Blood pressure borderline low.  Will need to closely monitor and wean if possible.   #Hx polysubstance abuse including EtOH -UDS+Cocaine, Last Drink on 08/06/23 --continue thiamine and folate Continue  CIWA protocol   Time: 50 minutes.   Physical Exam: Vitals:   08/17/23 0336 08/17/23 0351 08/17/23 0442 08/17/23 0945  BP:  (!) 86/44 (!) 93/52 (!) 101/54  Pulse:  86 88 84  Resp:  20  16  Temp:  98.4 F (36.9 C)  97.8 F (36.6 C)  TempSrc:    Oral  SpO2:  99%  99%  Weight: 96 kg     Height:       Body mass index is 34.16 kg/m.   General: Obese built, not in obvious distress, alert and oriented x 3. HENT:   No scleral pallor or icterus noted. Oral mucosa is moist.  Chest:  Diminished breath sounds bilaterally. No crackles or wheezes.  CVS: S1 &S2 heard. No murmur.  Regular rate and rhythm. Abdomen: Soft, left flank pain tenderness with palpation, obese abdomen, nontender bowel sounds are heard.   Extremities: Trace edema in lower extremities bilaterally. Psych: Mood is appropriate for condition and setting. CNS:  No cranial nerve deficits.  Power equal in all extremities.  Slow to respond. Skin: Warm and dry.  No rashes noted.   Data Reviewed: Laboratory and imaging data from last 24 hours reviewed.  Family Communication: none present at bedside.  Disposition: Status is: Inpatient  Remains inpatient appropriate because: Waiting on mental status stabilization, on Cortef, hypotension,   Planned Discharge Destination: Home, in 1 to 2 days. Time spent 35 minutes  Author: Darlin Drop, DO 08/17/2023 2:45 PM  For on call review [email or web address].

## 2023-08-17 NOTE — Plan of Care (Signed)

## 2023-08-17 NOTE — Progress Notes (Signed)
1 unit PRBC held at this time.  Will transfuse if hemoglobin is less than 8.0.

## 2023-08-17 NOTE — Progress Notes (Signed)
PHARMACY CONSULT NOTE - ELECTROLYTES  Pharmacy Consult for Electrolyte Monitoring and Replacement   Recent Labs: Height: 5\' 6"  (167.6 cm) Weight: 96 kg (211 lb 10.3 oz) IBW/kg (Calculated) : 63.8 Estimated Creatinine Clearance: 118.5 mL/min (by C-G formula based on SCr of 0.67 mg/dL).  Potassium (mmol/L)  Date Value  08/17/2023 3.9  09/06/2013 3.5   Magnesium (mg/dL)  Date Value  16/09/9603 2.1  03/09/2013 2.2   Calcium (mg/dL)  Date Value  54/08/8118 8.0 (L)   Calcium, Total (mg/dL)  Date Value  14/78/2956 8.9   Albumin (g/dL)  Date Value  21/30/8657 2.2 (L)  09/06/2013 4.0   Phosphorus (mg/dL)  Date Value  84/69/6295 4.7 (H)   Sodium (mmol/L)  Date Value  08/17/2023 137  09/06/2013 134 (L)   Corrected Ca: 10.56 mg/dL  Assessment  Ethan Wells is a 52 y.o. male presenting with acute GI bleed. PMH significant for alcohol abuse, alcoholic liver cirrhosis with portal gastropathy, s/p TIPS, superior mesenteric artery stenosis s/p stenting, HTN, HLD, CAD, STEMI, dCHF, GERD, depression with anxiety, adrenal insufficiency (Addison's disease), migraine headache, cardiac arrest due to V-fib, recurrent GI bleeding, cocaine abuse, tobacco abuse . Pharmacy has been consulted to monitor and replace electrolytes.  Diet: Thin fluids  Goal of Therapy: Electrolytes WNL  Plan:  Electrolytes WNL - no replacement needed at this time Check BMP, Mag and Phos with AM labs  Thank you for allowing pharmacy to be a part of this patient's care.   Bettey Costa, PharmD Clinical Pharmacist 08/17/2023 7:17 AM

## 2023-08-17 NOTE — Progress Notes (Signed)
Patient refused bed alarm . I have educated patient on the importance of keeping his bed alarm on but he is adamant on not keeping his bed alarm off.

## 2023-08-18 DIAGNOSIS — K264 Chronic or unspecified duodenal ulcer with hemorrhage: Secondary | ICD-10-CM | POA: Diagnosis not present

## 2023-08-18 LAB — BASIC METABOLIC PANEL
Anion gap: 7 (ref 5–15)
BUN: 15 mg/dL (ref 6–20)
CO2: 22 mmol/L (ref 22–32)
Calcium: 8.2 mg/dL — ABNORMAL LOW (ref 8.9–10.3)
Chloride: 111 mmol/L (ref 98–111)
Creatinine, Ser: 0.65 mg/dL (ref 0.61–1.24)
GFR, Estimated: >60 mL/min (ref >60)
Glucose, Bld: 120 mg/dL — ABNORMAL HIGH (ref 70–99)
Potassium: 3.5 mmol/L (ref 3.5–5.1)
Sodium: 140 mmol/L (ref 135–145)

## 2023-08-18 LAB — HEMOGLOBIN AND HEMATOCRIT, BLOOD
HCT: 21.1 % — ABNORMAL LOW (ref 39.0–52.0)
Hemoglobin: 6.8 g/dL — ABNORMAL LOW (ref 13.0–17.0)

## 2023-08-18 LAB — PREPARE RBC (CROSSMATCH)

## 2023-08-18 LAB — CBC
HCT: 24.9 % — ABNORMAL LOW (ref 39.0–52.0)
Hemoglobin: 8.1 g/dL — ABNORMAL LOW (ref 13.0–17.0)
MCH: 30.6 pg (ref 26.0–34.0)
MCHC: 32.5 g/dL (ref 30.0–36.0)
MCV: 94 fL (ref 80.0–100.0)
Platelets: 85 10*3/uL — ABNORMAL LOW (ref 150–400)
RBC: 2.65 MIL/uL — ABNORMAL LOW (ref 4.22–5.81)
RDW: 17.5 % — ABNORMAL HIGH (ref 11.5–15.5)
WBC: 6 10*3/uL (ref 4.0–10.5)
nRBC: 0 % (ref 0.0–0.2)

## 2023-08-18 LAB — PHOSPHORUS: Phosphorus: 4.3 mg/dL (ref 2.5–4.6)

## 2023-08-18 LAB — MAGNESIUM: Magnesium: 2.2 mg/dL (ref 1.7–2.4)

## 2023-08-18 MED ORDER — PANTOPRAZOLE SODIUM 40 MG IV SOLR
40.0000 mg | Freq: Two times a day (BID) | INTRAVENOUS | Status: DC
  Administered 2023-08-18 – 2023-08-23 (×12): 40 mg via INTRAVENOUS
  Filled 2023-08-18 (×12): qty 10

## 2023-08-18 MED ORDER — LORAZEPAM 2 MG/ML IJ SOLN
1.0000 mg | INTRAMUSCULAR | Status: AC | PRN
  Filled 2023-08-18: qty 1

## 2023-08-18 MED ORDER — SODIUM CHLORIDE 0.9% IV SOLUTION: Freq: Once | INTRAVENOUS | Status: AC

## 2023-08-18 MED ORDER — SODIUM CHLORIDE 0.9% FLUSH
10.0000 mL | INTRAVENOUS | Status: DC | PRN
  Administered 2023-08-22: 10 mL

## 2023-08-18 MED ORDER — SODIUM CHLORIDE 0.9% FLUSH
10.0000 mL | Freq: Two times a day (BID) | INTRAVENOUS | Status: DC
  Administered 2023-08-18 – 2023-08-23 (×11): 10 mL
  Administered 2023-08-24: 20 mL
  Administered 2023-08-24 – 2023-08-25 (×2): 10 mL

## 2023-08-18 MED ORDER — LORAZEPAM 1 MG PO TABS
1.0000 mg | ORAL_TABLET | ORAL | Status: AC | PRN
  Administered 2023-08-18 – 2023-08-20 (×3): 1 mg via ORAL
  Administered 2023-08-20: 2 mg via ORAL
  Administered 2023-08-21: 1 mg via ORAL
  Filled 2023-08-18: qty 2
  Filled 2023-08-18 (×4): qty 1

## 2023-08-18 NOTE — Plan of Care (Signed)

## 2023-08-18 NOTE — Progress Notes (Signed)
PHARMACY CONSULT NOTE - ELECTROLYTES  Pharmacy Consult for Electrolyte Monitoring and Replacement   Recent Labs: Height: 5\' 6"  (167.6 cm) Weight: 95.7 kg (210 lb 15.7 oz) IBW/kg (Calculated) : 63.8 Estimated Creatinine Clearance: 118.4 mL/min (by C-G formula based on SCr of 0.65 mg/dL).  Potassium (mmol/L)  Date Value  08/18/2023 3.5  09/06/2013 3.5   Magnesium (mg/dL)  Date Value  12/18/7251 2.2  03/09/2013 2.2   Calcium (mg/dL)  Date Value  66/44/0347 8.2 (L)   Calcium, Total (mg/dL)  Date Value  42/59/5638 8.9   Albumin (g/dL)  Date Value  75/64/3329 2.2 (L)  09/06/2013 4.0   Phosphorus (mg/dL)  Date Value  51/88/4166 4.3   Sodium (mmol/L)  Date Value  08/18/2023 140  09/06/2013 134 (L)   Corrected Ca: 10.56 mg/dL  Assessment  Ethan Wells is a 52 y.o. male presenting with acute GI bleed. PMH significant for alcohol abuse, alcoholic liver cirrhosis with portal gastropathy, s/p TIPS, superior mesenteric artery stenosis s/p stenting, HTN, HLD, CAD, STEMI, dCHF, GERD, depression with anxiety, adrenal insufficiency (Addison's disease), migraine headache, cardiac arrest due to V-fib, recurrent GI bleeding, cocaine abuse, tobacco abuse . Pharmacy has been consulted to monitor and replace electrolytes.  Goal of Therapy: Electrolytes WNL  Plan:  Electrolytes WNL - no replacement needed at this time Check electrolytes with AM labs  Thank you for allowing pharmacy to be a part of this patient's care.   Lowella Bandy, PharmD Clinical Pharmacist 08/18/2023 8:20 AM

## 2023-08-18 NOTE — Care Management Important Message (Signed)
Important Message  Patient Details  Name: Ethan Wells MRN: [id number] Date of Birth: Dec 01, 1971   Medicare Important Message Given:  Yes     [name] 08/18/2023, 11:15 AM

## 2023-08-18 NOTE — Progress Notes (Signed)
Inpatient Follow-up/Progress Note   Patient ID: Ethan Wells is a 52 y.o. male.  Overnight Events / Subjective Findings GI called back given drifting hgb. Down to 6.8 o/n. He received 1 u prbc o/n with rise to 8.1. systolic blood pressure ranging from 96-112 over the course of the day. Pt sleeping/resting comfortably upon my entering room. Wakes to voice. C/o of chronic abdominal pain. No melena or hematochezia. Nursing notes loose, dark brown stools. Pt notes chronic pain.  Review of Systems  Constitutional:  Negative for activity change, appetite change, chills, diaphoresis, fatigue, fever and unexpected weight change.  HENT:  Negative for trouble swallowing and voice change.   Respiratory:  Negative for shortness of breath and wheezing.   Cardiovascular:  Negative for chest pain, palpitations and leg swelling.  Gastrointestinal:  Positive for abdominal pain. Negative for abdominal distention, anal bleeding, blood in stool, constipation, diarrhea, nausea and vomiting.  Musculoskeletal:  Positive for arthralgias and myalgias.  Skin:  Negative for color change and pallor.  Neurological:  Negative for dizziness, syncope and weakness.  Psychiatric/Behavioral:  Negative for confusion. The patient is not nervous/anxious.   All other systems reviewed and are negative.    Medications  Current Facility-Administered Medications:    0.9 %  sodium chloride infusion (Manually program via Guardrails IV Fluids), , Intravenous, Once, Ouma, Hubbard Hartshorn, NP   0.9 %  sodium chloride infusion (Manually program via Guardrails IV Fluids), , Intravenous, Once, Hall, Carole N, DO   acetaminophen (TYLENOL) tablet 650 mg, 650 mg, Oral, Q6H PRN, Erin Fulling, MD, 650 mg at 08/18/23 1407   docusate sodium (COLACE) capsule 100 mg, 100 mg, Oral, BID PRN, Jimmye Norman, NP   feeding supplement (ENSURE ENLIVE / ENSURE PLUS) liquid 237 mL, 237 mL, Oral, BID BM, Esaw Grandchild A, DO, 237 mL  at 08/18/23 1430   folic acid (FOLVITE) tablet 1 mg, 1 mg, Oral, Daily, Lowella Bandy, RPH, 1 mg at 08/18/23 0827   gabapentin (NEURONTIN) capsule 100 mg, 100 mg, Oral, BID, Esaw Grandchild A, DO, 100 mg at 08/18/23 0827   hydrocortisone (CORTEF) tablet 5 mg, 5 mg, Oral, BID, Pokhrel, Laxman, MD, 5 mg at 08/18/23 0827   lactulose (CHRONULAC) 10 GM/15ML solution 30 g, 30 g, Oral, TID, Delfino Lovett, MD, 30 g at 08/18/23 1455   LORazepam (ATIVAN) tablet 1-4 mg, 1-4 mg, Oral, Q1H PRN, 1 mg at 08/18/23 1407 **OR** LORazepam (ATIVAN) injection 1-4 mg, 1-4 mg, Intravenous, Q1H PRN, Hall, Carole N, DO   methocarbamol (ROBAXIN) tablet 750 mg, 750 mg, Oral, BID PRN, Esaw Grandchild A, DO, 750 mg at 08/17/23 2236   midodrine (PROAMATINE) tablet 10 mg, 10 mg, Oral, TID WC, Lindajo Royal V, MD, 10 mg at 08/18/23 1204   multivitamin with minerals tablet 1 tablet, 1 tablet, Oral, Daily, Lindajo Royal V, MD, 1 tablet at 08/18/23 2595   nicotine (NICODERM CQ - dosed in mg/24 hours) patch 21 mg, 21 mg, Transdermal, Daily, Esaw Grandchild A, DO, 21 mg at 08/18/23 6387   oxyCODONE (Oxy IR/ROXICODONE) immediate release tablet 5 mg, 5 mg, Oral, Q12H PRN, Delfino Lovett, MD, 5 mg at 08/18/23 0846   pantoprazole (PROTONIX) injection 40 mg, 40 mg, Intravenous, Q12H, Hall, Carole N, DO, 40 mg at 08/18/23 1437   polyethylene glycol (MIRALAX / GLYCOLAX) packet 17 g, 17 g, Oral, Daily PRN, Ouma, Hubbard Hartshorn, NP   QUEtiapine (SEROQUEL) tablet 50 mg, 50 mg, Oral, QHS, Griffith, Kelly A, DO, 50 mg  at 08/17/23 2232   rifaximin (XIFAXAN) tablet 550 mg, 550 mg, Oral, BID, Locklear, Rossie Muskrat, MD, 550 mg at 08/18/23 0826   sodium chloride flush (NS) 0.9 % injection 10-40 mL, 10-40 mL, Intracatheter, Q12H, Kasa, Kurian, MD, 10 mL at 08/17/23 2233   sodium chloride flush (NS) 0.9 % injection 10-40 mL, 10-40 mL, Intracatheter, PRN, Erin Fulling, MD   sodium chloride flush (NS) 0.9 % injection 10-40 mL, 10-40 mL, Intracatheter, Q12H,  Hall, Carole N, DO   sodium chloride flush (NS) 0.9 % injection 10-40 mL, 10-40 mL, Intracatheter, PRN, Hall, Carole N, DO   thiamine (VITAMIN B1) tablet 100 mg, 100 mg, Oral, Daily, 100 mg at 08/18/23 0827 **OR** thiamine (VITAMIN B1) injection 100 mg, 100 mg, Intravenous, Daily, Lindajo Royal V, MD   acetaminophen, docusate sodium, LORazepam **OR** LORazepam, methocarbamol, oxyCODONE, polyethylene glycol, sodium chloride flush, sodium chloride flush   Objective    Vitals:   08/18/23 0851 08/18/23 0852 08/18/23 0909 08/18/23 1402  BP: 109/64 109/64 110/68 112/64  Pulse: 88 88 88 (!) 101  Resp:   18   Temp:   97.8 F (36.6 C)   TempSrc:      SpO2:   100%   Weight:      Height:         Physical Exam Vitals and nursing note reviewed.  Constitutional:      General: He is not in acute distress.    Appearance: He is obese. He is ill-appearing. He is not toxic-appearing or diaphoretic.     Comments: Appears older than stated age  HENT:     Head: Normocephalic and atraumatic.     Nose: Nose normal.     Mouth/Throat:     Mouth: Mucous membranes are moist.     Pharynx: Oropharynx is clear.  Eyes:     General: No scleral icterus.    Extraocular Movements: Extraocular movements intact.  Cardiovascular:     Rate and Rhythm: Normal rate and regular rhythm.  Pulmonary:     Effort: Pulmonary effort is normal. No respiratory distress.     Breath sounds: Normal breath sounds. No wheezing, rhonchi or rales.  Abdominal:     General: Bowel sounds are normal. There is no distension.     Palpations: Abdomen is soft.     Tenderness: There is abdominal tenderness (mild). There is no guarding or rebound.  Musculoskeletal:     Cervical back: Neck supple.  Skin:    General: Skin is warm and dry.     Coloration: Skin is not jaundiced or pale.  Neurological:     General: No focal deficit present.     Mental Status: He is alert and oriented to person, place, and time. Mental status is at  baseline.  Psychiatric:        Mood and Affect: Mood normal.        Behavior: Behavior normal.        Thought Content: Thought content normal.        Judgment: Judgment normal.      Laboratory Data Recent Labs  Lab 08/16/23 0341 08/17/23 1501 08/17/23 1519 08/17/23 2049 08/18/23 0319 08/18/23 1044  WBC 5.9  --  7.9  --   --  6.0  HGB 7.5*   < > 8.5* 8.7* 6.8* 8.1*  HCT 23.3*   < > 26.5* 27.5* 21.1* 24.9*  PLT 100*  --  112*  --   --  85*   < > =  values in this interval not displayed.   Recent Labs  Lab 08/13/23 0422 08/14/23 0509 08/16/23 0341 08/17/23 0423 08/18/23 0319  NA 138   < > 138 137 140  K 3.5   < > 3.5 3.9 3.5  CL 112*   < > 110 111 111  CO2 23   < > 21* 20* 22  BUN 7   < > 16 17 15   CREATININE 0.66   < > 0.77 0.67 0.65  CALCIUM 7.9*   < > 8.4* 8.0* 8.2*  PROT 5.2*  --  4.8* 4.6*  --   BILITOT 1.9*  --  1.5* 1.4*  --   ALKPHOS 138*  --  118 122  --   ALT 31  --  37 45*  --   AST 73*  --  47* 63*  --   GLUCOSE 156*   < > 140* 104* 120*   < > = values in this interval not displayed.   Recent Labs  Lab 08/12/23 0520  INR 1.4*      Imaging Studies: US RENAL  Result Date: 08/17/2023 CLINICAL DATA:  Left flank pain EXAM: RENAL / URINARY TRACT ULTRASOUND COMPLETE COMPARISON:  08/09/2023 FINDINGS: Right Kidney: Renal measurements: 10.6 x 4.6 x 4.9 cm = volume: 123.8 mL. Echogenicity within normal limits. No mass or hydronephrosis visualized. Left Kidney: Renal measurements: 9.8 x 4.1 x 4.5 cm = volume: 93.5 mL. Echogenicity within normal limits. No mass or hydronephrosis visualized. Bladder: Bladder is decompressed, limiting its evaluation. Stable nonspecific wall thickening. Other: None. IMPRESSION: 1. Unremarkable appearance of the bilateral kidneys. 2. Nonspecific bladder wall thickening given decompressed state, unchanged since prior CT. Electronically Signed   By: Sharlet Salina M.D.   On: 08/17/2023 17:58    Assessment:   #Duodenal ulcer  bleeding -Status post EGD with pura stat - blood present in stool on presentation  # Decompensated cirrhosis status post TIPS secondary to alcohol abuse  # Portal hypertension with multiple sequelae including history of esophageal varices now status post TIPS  # celiac/sma stenosis s/p stenting  # recurrent hemorrhoidal/rectal bleeding  # HFpEF # CAD # multiple electrolyte abnormalities # nsaid abuse #Alcohol and polysubstance abuse   Plan:  Plan for repeat EGD with Dr. Mia Creek tmrw Clear liquids now; Plan for NPO at midnight Esophagogastroduodenoscopy planned for tomorrow pending patient stability and endoscopy suite availability Protonix 40 mg iv q12 h Hold dvt ppx Monitor H&H.  Transfusion and resuscitation as per primary team Avoid frequent lab draws to prevent lab induced anemia Supportive care and antiemetics as per primary team Maintain two sites IV access Avoid nsaids Monitor for GIB.  Should patient develop hemodynamic instability in the setting of GI bleeding would recommend IR consultation for possible embolization of his duodenal ulcer bleed  Esophagogastroduodenoscopy with possible biopsy, control of bleeding, polypectomy, and interventions as necessary has been discussed with the patient/patient representative. Informed consent was obtained from the patient/patient representative after explaining the indication, nature, and risks of the procedure including but not limited to death, bleeding, perforation, missed neoplasm/lesions, cardiorespiratory compromise, and reaction to medications. Opportunity for questions was given and appropriate answers were provided. Patient/patient representative has verbalized understanding is amenable to undergoing the procedure.  Given the patient's decompensated cirrhosis, continued polysubstance abuse, chronic pain, repeated hospitalizations- recommend continued palliative/hospice discussion with the patient. His overall prognosis is  very poor  I personally performed the service.  Management of other medical comorbidities as per primary team  Thank  you for allowing [place] to participate in this patient's care. Please don't hesitate to call if any questions or concerns arise.   Jaynie Collins, DO [facility]  Portions of the record may have been created with voice recognition software. Occasional wrong-word or 'sound-a-like' substitutions may have occurred due to the inherent limitations of voice recognition software.  Read the chart carefully and recognize, using context, where substitutions may have occurred.

## 2023-08-18 NOTE — Progress Notes (Signed)
       CROSS COVER NOTE  NAME: Ethan Wells MRN: [id number] DOB : [birth year]    Concern as stated by nurse / staff   hemoglobin 6.8      Pertinent findings on chart review:    Latest Ref Rng & Units 08/18/2023    3:19 AM 08/17/2023    8:49 PM 08/17/2023    3:19 PM  CBC  WBC 4.0 - 10.5 K/uL   7.9   Hemoglobin 13.0 - 17.0 g/dL 6.8  8.7  8.5   Hematocrit 39.0 - 52.0 % 21.1  27.5  26.5   Platelets 150 - 400 K/uL   112       08/18/2023    2:33 AM 08/18/2023    1:55 AM 08/17/2023   11:52 PM  Vitals with BMI  Weight  211 lbs   BMI  34.07   Systolic 95  106  Diastolic 51  60  Pulse 88  91     Assessment and  Interventions   Assessment:  Anemia likely secondary to ongoing blood loss anemia  Plan: Transfuse 1 unit PRBC with serial H&H thereafter X X

## 2023-08-18 NOTE — Plan of Care (Signed)
  Problem: Clinical Measurements: Goal: Ability to maintain clinical measurements within normal limits will improve Outcome: Progressing Goal: Will remain free from infection Outcome: Progressing   

## 2023-08-18 NOTE — Progress Notes (Signed)
Patient with Hx of alcohol abuse.  Last alcoholic drink was on 08/06/23, per the patient 3 days prior to his admission to the hospital.  States he was drinking about a pint of wine cooler per day.  States he thinks he is going through alcohol withdrawal.  CIWA protocol restarted.  We will continue to closely monitor.

## 2023-08-18 NOTE — Progress Notes (Signed)
Progress Note   [name]:[id number] DOB: October 03, 1971 DOA: 08/08/2023     10 DOS: the patient was seen and examined on 08/18/2023   Brief hospital course:  52 y.o. male with past medical history of alcohol abuse, alcoholic liver cirrhosis with portal gastropathy, s/p TIPS, superior mesenteric artery stenosis s/p stenting, HTN, HLD, CAD, STEMI, chronic diastolic CHF, GERD, depression with anxiety, adrenal insufficiency (Addison's disease), migraine headache, cardiac arrest due to V-fib, recurrent GI bleeding, cocaine abuse, tobacco abuse  was initially admitted to ICU on evening of 08/07/2023 with Hemorrhagic shock due to Acute GI bleed, requiring pressors for BP support.   Patient required transfusion of 2 units pRBC's.  Gastroenterology was consulted and patient underwent EGD with findings of non-bleeding duodenal ulcers, one area treated with Purastat for concern of a healing, recently bleeding vessel. TRH assumed care 08/10/2023.  Patient's BP soft but stable since weaned off pressors 8/24.  Transferred to med/tele floor. Hospital course complicated by hypotension and soft BPs, acute withdrawal syndrome with encephalopathy.  Lactulose dose was increased on 08/16/2023.  2 units PRBCs were ordered to be transfused for hemoglobin of 6.8.  08/18/2023: The patient was seen and examined at bedside.  He reports having the shakes and he thinks he is in alcohol withdrawal.  CIWA protocol restarted.  Also reports bilateral flank pain.  Renal ultrasound was nonacute.  Assessment and Plan:  # Resolved hypovolemic/Hemorrhagic Shock Currently on midodrine 10 mg 3 times daily, Solu-Cortef, received 2 doses of IV albumin on 08/17/2023. Continue to monitor, goal MAP greater than 65. Continue to closely monitor vital signs Continue to closely hemoglobin with stable H&H.   #Acute blood loss anemia in the setting of acute GI bleed #Thrombocytopenia Alcoholic Cirrhosis s/p TIPS 4/27, esophageal  varices, duodenal ulcers seen on EGD CT Angio GI Bleed  showed Active GI bleed in the region of the gastric antrum/pylorus and proximal duodenum,  Cirrhosis of liver with patent appearing TIPS. GI Dr. Mia Creek and IR oncall Dr. Fredia Sorrow both reviewed and agreed does not look like a variceal bleed by CTA.   GI following the patient and diet has been advanced.  P.o. PPI switched to IV twice daily Completed 7-day course of IV antibiotics Continue to hold off antiplatelets, Plavix, and avoid all NSAIDs. On rifaximin and lactulose due to recent hepatic encephalopathy 2 unit PRBCs ordered to be transfused for hemoglobin of 6.8.  Bilateral flank pain in the setting of chronic pain syndrome CT scan revealed possible cluster of kidney stones involving left kidney Renal ultrasound was nonacute.   # Resolved acute hepatic encephalopathy  # Acute withdrawal syndrome ammonia of 49-> 56.  Continue CIWA protocol as needed Ativan.  Librium has been discontinued.  Continue rifaximin and lactulose.  Ammonia level has normalized on 08/17/2023.  Mentation is back to baseline.  Alcoholic cirrhosis with Portal gastropathy S/p TIPS 4/27 Continue Rifaximin and lactulose.  Chronic diastolic congestive heart failure. Continue to monitor intake and output charting Daily weights.   #Non Anion Gap Metabolic Acidosis due Lactic Acidosis #Hypokalemia #Hypophosphatemia Mild hyponatremia.  Repleted and normalized.   #Hx adrenal insufficiency  On Cortef.   A.m. cortisol of 1.9.  Thyroid function within normal range.  Continue midodrine dose to 10 mg 3 times daily.  Continue to closely monitor vital signs.   #Hx polysubstance abuse including EtOH -UDS+Cocaine, Last Drink on 08/06/23 --continue thiamine and folate CIWA protocol which started on 9-24.   Time: 50 minutes.   Physical Exam:  Vitals:   08/18/23 0851 08/18/23 0852 08/18/23 0909 08/18/23 1402  BP: 109/64 109/64 110/68 112/64  Pulse: 88 88 88 (!) 101   Resp:   18   Temp:   97.8 F (36.6 C)   TempSrc:      SpO2:   100%   Weight:      Height:       Body mass index is 34.05 kg/m.   General: Obese built, not in obvious distress, alert and oriented x 3. HENT:   No scleral pallor or icterus noted. Oral mucosa is moist.  Chest:  Diminished breath sounds bilaterally. No crackles or wheezes.  CVS: S1 &S2 heard. No murmur.  Regular rate and rhythm. Abdomen: Soft, left flank pain tenderness with palpation, obese abdomen, nontender bowel sounds are heard.   Extremities: Trace edema in lower extremities bilaterally.   Psych: Mood is appropriate for condition setting. CNS:  No cranial nerve deficits.  Power equal in all extremities.  Slow to respond. Skin: Warm and dry.  No rashes noted.   Data Reviewed: Laboratory and imaging data from last 24 hours reviewed.  Family Communication: none present at bedside.  Disposition: Status is: Inpatient  Remains inpatient appropriate because: Waiting on mental status stabilization, on Cortef, hypotension,   Planned Discharge Destination: Home, in 1 to 2 days. Time spent 35 minutes  Author: Darlin Drop, DO 08/18/2023 2:16 PM  For on call review [email or web address].

## 2023-08-18 NOTE — Plan of Care (Signed)

## 2023-08-19 ENCOUNTER — Inpatient Hospital Stay: Payer: Medicare HMO | Admitting: Anesthesiology

## 2023-08-19 ENCOUNTER — Encounter
Admission: EM | Disposition: A | Discharge status: Home / Self Care | Payer: Self-pay | Source: Home / Self Care | Attending: Internal Medicine

## 2023-08-19 DIAGNOSIS — I5042 Chronic combined systolic (congestive) and diastolic (congestive) heart failure: Secondary | ICD-10-CM | POA: Diagnosis not present

## 2023-08-19 DIAGNOSIS — K2270 Barrett's esophagus without dysplasia: Secondary | ICD-10-CM | POA: Diagnosis not present

## 2023-08-19 DIAGNOSIS — F17200 Nicotine dependence, unspecified, uncomplicated: Secondary | ICD-10-CM | POA: Diagnosis not present

## 2023-08-19 DIAGNOSIS — I110 Hypertensive heart disease with heart failure: Secondary | ICD-10-CM | POA: Diagnosis not present

## 2023-08-19 DIAGNOSIS — K766 Portal hypertension: Secondary | ICD-10-CM | POA: Diagnosis not present

## 2023-08-19 DIAGNOSIS — I2510 Atherosclerotic heart disease of native coronary artery without angina pectoris: Secondary | ICD-10-CM | POA: Diagnosis not present

## 2023-08-19 DIAGNOSIS — K264 Chronic or unspecified duodenal ulcer with hemorrhage: Secondary | ICD-10-CM | POA: Diagnosis not present

## 2023-08-19 DIAGNOSIS — K3184 Gastroparesis: Secondary | ICD-10-CM | POA: Diagnosis not present

## 2023-08-19 DIAGNOSIS — K269 Duodenal ulcer, unspecified as acute or chronic, without hemorrhage or perforation: Secondary | ICD-10-CM | POA: Diagnosis not present

## 2023-08-19 HISTORY — PX: ESOPHAGOGASTRODUODENOSCOPY (EGD) WITH PROPOFOL: SHX5813

## 2023-08-19 LAB — MAGNESIUM: Magnesium: 2 mg/dL (ref 1.7–2.4)

## 2023-08-19 LAB — CBC
HCT: 26.6 % — ABNORMAL LOW (ref 39.0–52.0)
Hemoglobin: 8.4 g/dL — ABNORMAL LOW (ref 13.0–17.0)
MCH: 29.8 pg (ref 26.0–34.0)
MCHC: 31.6 g/dL (ref 30.0–36.0)
MCV: 94.3 fL (ref 80.0–100.0)
Platelets: 82 10*3/uL — ABNORMAL LOW (ref 150–400)
RBC: 2.82 MIL/uL — ABNORMAL LOW (ref 4.22–5.81)
RDW: 17.6 % — ABNORMAL HIGH (ref 11.5–15.5)
WBC: 6.8 10*3/uL (ref 4.0–10.5)
nRBC: 0 % (ref 0.0–0.2)

## 2023-08-19 LAB — TYPE AND SCREEN
ABO/RH(D): A POS
Antibody Screen: NEGATIVE
Unit division: 0

## 2023-08-19 LAB — BASIC METABOLIC PANEL
Anion gap: 6 (ref 5–15)
BUN: 14 mg/dL (ref 6–20)
CO2: 24 mmol/L (ref 22–32)
Calcium: 8.2 mg/dL — ABNORMAL LOW (ref 8.9–10.3)
Chloride: 111 mmol/L (ref 98–111)
Creatinine, Ser: 0.61 mg/dL (ref 0.61–1.24)
GFR, Estimated: >60 mL/min (ref >60)
Glucose, Bld: 90 mg/dL (ref 70–99)
Potassium: 3.7 mmol/L (ref 3.5–5.1)
Sodium: 141 mmol/L (ref 135–145)

## 2023-08-19 LAB — BPAM RBC
Blood Product Expiration Date: 202409282359
ISSUE DATE / TIME: 202409020548
Unit Type and Rh: 6200

## 2023-08-19 LAB — PHOSPHORUS: Phosphorus: 4.1 mg/dL (ref 2.5–4.6)

## 2023-08-19 SURGERY — ESOPHAGOGASTRODUODENOSCOPY (EGD) WITH PROPOFOL
Anesthesia: General

## 2023-08-19 MED ORDER — IPRATROPIUM-ALBUTEROL 0.5-2.5 (3) MG/3ML IN SOLN
3.0000 mL | Freq: Two times a day (BID) | RESPIRATORY_TRACT | Status: DC
  Administered 2023-08-20 – 2023-08-22 (×5): 3 mL via RESPIRATORY_TRACT
  Filled 2023-08-19 (×5): qty 3

## 2023-08-19 MED ORDER — IPRATROPIUM-ALBUTEROL 0.5-2.5 (3) MG/3ML IN SOLN
3.0000 mL | Freq: Once | RESPIRATORY_TRACT | Status: AC
  Administered 2023-08-19: 3 mL via RESPIRATORY_TRACT

## 2023-08-19 MED ORDER — IPRATROPIUM-ALBUTEROL 0.5-2.5 (3) MG/3ML IN SOLN
RESPIRATORY_TRACT | Status: AC
  Filled 2023-08-19: qty 3

## 2023-08-19 MED ORDER — IPRATROPIUM-ALBUTEROL 0.5-2.5 (3) MG/3ML IN SOLN: 3.0000 mL | Freq: Four times a day (QID) | RESPIRATORY_TRACT | Status: DC

## 2023-08-19 MED ORDER — SODIUM CHLORIDE 0.9 % IV SOLN
INTRAVENOUS | Status: DC | PRN
Start: 2023-08-19 — End: 2023-08-19

## 2023-08-19 MED ORDER — IPRATROPIUM-ALBUTEROL 0.5-2.5 (3) MG/3ML IN SOLN: 3.0000 mL | Freq: Once | RESPIRATORY_TRACT | Status: DC

## 2023-08-19 MED ORDER — IPRATROPIUM-ALBUTEROL 0.5-2.5 (3) MG/3ML IN SOLN
3.0000 mL | Freq: Four times a day (QID) | RESPIRATORY_TRACT | Status: DC
  Administered 2023-08-19 (×2): 3 mL via RESPIRATORY_TRACT
  Filled 2023-08-19 (×2): qty 3

## 2023-08-19 MED ORDER — PROPOFOL 10 MG/ML IV BOLUS
INTRAVENOUS | Status: DC | PRN
  Administered 2023-08-19: 75 ug/kg/min via INTRAVENOUS
  Administered 2023-08-19: 100 mg via INTRAVENOUS

## 2023-08-19 MED ORDER — LIDOCAINE HCL (CARDIAC) PF 100 MG/5ML IV SOSY
PREFILLED_SYRINGE | INTRAVENOUS | Status: DC | PRN
  Administered 2023-08-19: 100 mg via INTRAVENOUS

## 2023-08-19 NOTE — Progress Notes (Signed)
PHARMACY CONSULT NOTE - ELECTROLYTES  Pharmacy Consult for Electrolyte Monitoring and Replacement   Recent Labs: Height: 5\' 6"  (167.6 cm) Weight: 95.7 kg (210 lb 15.7 oz) IBW/kg (Calculated) : 63.8 Estimated Creatinine Clearance: 118.4 mL/min (by C-G formula based on SCr of 0.61 mg/dL).  Potassium (mmol/L)  Date Value  08/19/2023 3.7  09/06/2013 3.5   Magnesium (mg/dL)  Date Value  91/47/8295 2.0  03/09/2013 2.2   Calcium (mg/dL)  Date Value  62/13/0865 8.2 (L)   Calcium, Total (mg/dL)  Date Value  78/46/9629 8.9   Albumin (g/dL)  Date Value  52/84/1324 2.2 (L)  09/06/2013 4.0   Phosphorus (mg/dL)  Date Value  40/09/2724 4.1   Sodium (mmol/L)  Date Value  08/19/2023 141  09/06/2013 134 (L)   Corrected Ca: 10.56 mg/dL  Assessment  Ethan Wells is a 52 y.o. male presenting with acute GI bleed. PMH significant for alcohol abuse, alcoholic liver cirrhosis with portal gastropathy, s/p TIPS, superior mesenteric artery stenosis s/p stenting, HTN, HLD, CAD, STEMI, dCHF, GERD, depression with anxiety, adrenal insufficiency (Addison's disease), migraine headache, cardiac arrest due to V-fib, recurrent GI bleeding, cocaine abuse, tobacco abuse . Pharmacy has been consulted to monitor and replace electrolytes.  Goal of Therapy: Electrolytes WNL  Plan:  Electrolytes WNL - no replacement needed at this time Check electrolytes with AM labs  Thank you for allowing pharmacy to be a part of this patient's care.   Kaaliyah Kita Rodriguez-Guzman PharmD, BCPS 08/19/2023 7:33 AM

## 2023-08-19 NOTE — Plan of Care (Signed)

## 2023-08-19 NOTE — OR Nursing (Signed)
Patient requested a breathing treatment for SOB. Dr Pernell Dupre order duoneb x2. On completion patient stated he could run a marathon.

## 2023-08-19 NOTE — Transfer of Care (Signed)
Immediate Anesthesia Transfer of Care Note  [name]  Procedure(s) Performed: ESOPHAGOGASTRODUODENOSCOPY (EGD) WITH PROPOFOL  Patient Location: PACU  Anesthesia Type:MAC  Level of Consciousness: drowsy  Airway & Oxygen Therapy: Patient Spontanous Breathing  Post-op Assessment: Report given to RN and Post -op Vital signs reviewed and stable  Post vital signs: Reviewed and stable  Last Vitals:  Vitals Value Taken Time  BP 85/35 08/19/23 1535  Temp    Pulse 87 08/19/23 1537  Resp 23 08/19/23 1537  SpO2 91 % 08/19/23 1537  Vitals shown include unfiled device data.  Last Pain:  Vitals:   08/19/23 1500  TempSrc: Temporal  PainSc: Asleep      Patients Stated Pain Goal: 0 (08/13/23 0520)  Complications: No notable events documented.

## 2023-08-19 NOTE — Op Note (Signed)
[facility] Gastroenterology Patient Name: Ethan Wells Procedure Date: 08/19/2023 3:14 PM MRN: [id number] Account #: [id number] Date of Birth: [birth year] Admit Type: Inpatient Age: 52 Room: [facility] ENDO ROOM 1 Gender: Male Note Status: Finalized Instrument Name: Laurette Schimke [id number] Procedure:             Upper GI endoscopy Indications:           Iron deficiency anemia secondary to chronic blood loss Providers:             Eather Colas MD, MD Referring MD:          Grayce Sessions, Avanell Shackleton Medicines:             Monitored Anesthesia Care Complications:         No immediate complications. Procedure:             Pre-Anesthesia Assessment:                        - Prior to the procedure, a History and Physical was                         performed, and patient medications and allergies were                         reviewed. The patient is competent. The risks and                         benefits of the procedure and the sedation options and                         risks were discussed with the patient. All questions                         were answered and informed consent was obtained.                         Patient identification and proposed procedure were                         verified by the physician, the nurse, the                         anesthesiologist, the anesthetist and the technician                         in the endoscopy suite. Mental Status Examination:                         alert and oriented. Airway Examination: normal                         oropharyngeal airway and neck mobility. Respiratory                         Examination: clear to auscultation. CV Examination:                         normal. Prophylactic Antibiotics: The patient does not  require prophylactic antibiotics. Prior                         Anticoagulants: The patient has taken no anticoagulant                         or antiplatelet agents.  ASA Grade Assessment: III - A                         patient with severe systemic disease. After reviewing                         the risks and benefits, the patient was deemed in                         satisfactory condition to undergo the procedure. The                         anesthesia plan was to use monitored anesthesia care                         (MAC). Immediately prior to administration of                         medications, the patient was re-assessed for adequacy                         to receive sedatives. The heart rate, respiratory                         rate, oxygen saturations, blood pressure, adequacy of                         pulmonary ventilation, and response to care were                         monitored throughout the procedure. The physical                         status of the patient was re-assessed after the                         procedure.                        After obtaining informed consent, the endoscope was                         passed under direct vision. Throughout the procedure,                         the patient's blood pressure, pulse, and oxygen                         saturations were monitored continuously. The Endoscope                         was introduced through the mouth, and advanced to the  second part of duodenum. The upper GI endoscopy was                         accomplished without difficulty. The patient tolerated                         the procedure well. Findings:      The exam of the esophagus was otherwise normal.      Mild portal hypertensive gastropathy was found in the gastric body.      Two non-bleeding cratered duodenal ulcers with a clean ulcer base       (Forrest Class III) were found in the duodenal bulb. These are in the       process of healing and are improved from previous EGD.      Salmon-colored mucosa was present. The maximum longitudinal extent of       these esophageal mucosal  changes was 3 cm in length. Impression:            - Portal hypertensive gastropathy.                        - Non-bleeding duodenal ulcers with a clean ulcer base                         (Forrest Class III).                        - Salmon-colored mucosa suspicious for short-segment                         Barrett's esophagus.                        - No specimens collected. Recommendation:        - Return patient to hospital ward for ongoing care.                        - Advance diet as tolerated.                        - Continue present medications.                        - Return to GI clinic as outpatient to discuss                         potential repeat EGD for history of BE. Procedure Code(s):     --- Professional ---                        [phone], Esophagogastroduodenoscopy, flexible,                         transoral; diagnostic, including collection of                         specimen(s) by brushing or washing, when performed                         (separate procedure) Diagnosis Code(s):     --- Professional ---  K76.6, Portal hypertension                        K31.89, Other diseases of stomach and duodenum                        K26.9, Duodenal ulcer, unspecified as acute or                         chronic, without hemorrhage or perforation                        K22.89, Other specified disease of esophagus                        D50.0, Iron deficiency anemia secondary to blood loss                         (chronic) CPT copyright 2022 American [employer] Association. All rights reserved. The codes documented in this report are preliminary and upon coder review may  be revised to meet current compliance requirements. Eather Colas MD, MD 08/19/2023 3:36:55 PM Number of Addenda: 0 Note Initiated On: 08/19/2023 3:14 PM Estimated Blood Loss:  Estimated blood loss: none.      [facility]

## 2023-08-19 NOTE — Progress Notes (Signed)
Progress Note   [name] ZOX:[id number] DOB: June 27, 1971 DOA: 08/08/2023     11 DOS: the patient was seen and examined on 08/19/2023   Brief hospital course:  52 y.o. male with past medical history of alcohol abuse, alcoholic liver cirrhosis with portal gastropathy, s/p TIPS, superior mesenteric artery stenosis s/p stenting, HTN, HLD, CAD, STEMI, chronic diastolic CHF, GERD, depression with anxiety, adrenal insufficiency (Addison's disease), migraine headache, cardiac arrest due to V-fib, recurrent GI bleeding, cocaine abuse, tobacco abuse  was initially admitted to ICU on 08/07/2023 with Hemorrhagic shock due to Acute GI bleed, requiring vasopressors.  Required transfusion of 2 units pRBC's.  Seen by GI and underwent EGD with findings of non-bleeding duodenal ulcers, one area treated with Purastat for concern of a healing, recently bleeding vessel. Currently on IV PPI twice daily.  TRH assumed care 08/10/2023.    Hospital course complicated by hypotension and soft BPs, acute withdrawal syndrome with acute hepatic encephalopathy.  Lactulose dose was increased on 08/16/2023.  2 units PRBCs were ordered to be transfused for hemoglobin of 6.8 on 08/18/2023.  GI was made aware and planned for repeat EGD on 08/19/2023.   08/19/2023: The patient was seen and examined at his bedside.  He has no new complaints.  Taken for EGD due to concern for possible recurrent upper GI bleed.    Assessment and Plan:  # Resolved hypovolemic/Hemorrhagic Shock BPs are stable Currently on midodrine 10 mg 3 times daily, Solu-Cortef, received 2 doses of IV albumin on 08/17/2023. Continue to monitor vital signs, goal MAP greater than 65.   #Acute blood loss anemia in the setting of acute upper GI bleed #Thrombocytopenia Alcoholic Cirrhosis s/p TIPS 4/27, duodenal ulcers, portal hypertension gastropathy, seen on EGD done on 08/09/2023 and 08/19/2023. CT Angio GI Bleed  showed Active GI bleed in the region of the  gastric antrum/pylorus and proximal duodenum,  Cirrhosis of liver with patent appearing TIPS. GI Dr. Mia Creek and IR oncall Dr. Fredia Sorrow both reviewed and agreed does not look like a variceal bleed by CTA.   P.o. PPI switched to IV twice daily Completed 7-day course of IV antibiotics Continue to hold off antiplatelets, Plavix, and avoid all NSAIDs. On rifaximin and lactulose due to recent hepatic encephalopathy 2 unit PRBCs ordered to be transfused for hemoglobin of 6.8 on 08/18/23 Repeat EGD done on 08/19/2023. . Bilateral flank pain in the setting of chronic pain syndrome CT scan revealed possible cluster of kidney stones involving left kidney Renal ultrasound was nonacute.   # Resolved acute hepatic encephalopathy  # Acute withdrawal syndrome ammonia of 49-> 56.  Continue lactulose and rifaximin, continue CIWA protocol with as needed Ativan.   Ammonia level has normalized on 08/17/2023.  Mentation is back to baseline.  Alcoholic cirrhosis with Portal gastropathy S/p TIPS 4/27 Continue Rifaximin and lactulose.  Chronic diastolic congestive heart failure. Continue to monitor intake and output charting Daily weights.   #Non Anion Gap Metabolic Acidosis due Lactic Acidosis #Hypokalemia #Hypophosphatemia Mild hyponatremia.  Electrolytes repleted and normalized.   #Hx adrenal insufficiency  On Cortef.   A.m. cortisol of 1.9.  Thyroid function within normal range.  Continue midodrine dose to 10 mg 3 times daily.  Continue to closely monitor vital signs. Maintain MAP greater than 65   #Hx polysubstance abuse including EtOH -UDS+Cocaine, Last Drink on 08/06/23 --continue thiamine and folate CIWA protocol in place.  Tobacco abuse Possible COPD Nicotine patch DuoNebs every 6 hours  Physical debility PT OT with  fall precautions Out of bed to chair every shift   Time: 50 minutes.   Physical Exam: Vitals:   08/19/23 1500 08/19/23 1530 08/19/23 1540 08/19/23 1550  BP: (!) 85/35 (!)  85/35 (!) 91/44 (!) 93/57  Pulse: 88 88 86 86  Resp: (!) 21 (!) 21 (!) 23 20  Temp: 98 F (36.7 C) 98 F (36.7 C)    TempSrc: Temporal Temporal    SpO2: 91% 91% 91% 98%  Weight:      Height:       Body mass index is 33.91 kg/m.   General: Obese built, not in obvious distress, alert and oriented x 3. HENT:   No scleral pallor or icterus noted. Oral mucosa is moist.  Chest:  Diminished breath sounds bilaterally. No crackles or wheezes.  CVS: Clear to auscultation Abdomen: Soft, left flank pain tenderness with palpation, obese abdomen, nontender bowel sounds are heard.   Extremities: Dependent edema Psych: Mood is appropriate for condition setting. CNS:  No cranial nerve deficits.  Power equal in all extremities.  Slow to respond. Skin: Warm and dry.  No rashes noted.   Data Reviewed: Laboratory and imaging data from last 24 hours reviewed.  Family Communication: none present at bedside.  Disposition: Status is: Inpatient  Remains inpatient appropriate because: Waiting on mental status stabilization, on Cortef, hypotension,   Planned Discharge Destination: Home, in 1 to 2 days. Time spent 35 minutes  Author: Darlin Drop, DO 08/19/2023 4:55 PM  For on call review [email or web address].

## 2023-08-19 NOTE — TOC Progression Note (Signed)
Transition of Care [facility]) - Progression Note    Patient Details  Name: Ethan Wells MRN: [id number] Date of Birth: 1971/01/26  Transition of Care [facility]) CM/SW Contact  Truddie Hidden, RN Phone Number: 08/19/2023, 2:52 PM  Clinical Narrative:    TOC continuing to follow patient's progress throughout discharge planning.   Expected Discharge Plan: Home/Self Care Barriers to Discharge: Continued Medical Work up  Expected Discharge Plan and Services     Post Acute Care Choice: NA Living arrangements for the past 2 months: Homeless                                       Social Determinants of Health (SDOH) Interventions SDOH Screenings   Food Insecurity: No Food Insecurity (08/09/2023)  Housing: Patient Unable To Answer (08/09/2023)  Transportation Needs: No Transportation Needs (08/09/2023)  Utilities: Not At Risk (08/09/2023)  Alcohol Screen: Medium Risk (10/01/2022)  Tobacco Use: High Risk (08/19/2023)    Readmission Risk Interventions    08/11/2023   10:35 AM 02/24/2023    3:12 PM 01/17/2023    3:38 PM  Readmission Risk Prevention Plan  Transportation Screening Complete Complete Complete  Medication Review Oceanographer) Complete Complete Complete  PCP or Specialist appointment within 3-5 days of discharge Complete    SW Recovery Care/Counseling Consult Complete  Not Complete  SW Consult Not Complete Comments   lethargic  Palliative Care Screening Not Applicable Not Applicable Not Applicable  Skilled Nursing Facility Not Applicable Not Applicable Not Applicable

## 2023-08-19 NOTE — Anesthesia Preprocedure Evaluation (Addendum)
Anesthesia Evaluation  Patient identified by MRN, date of birth, ID band Patient awake    Reviewed: Allergy & Precautions, NPO status , Patient's Chart, lab work & pertinent test results  History of Anesthesia Complications Negative for: history of anesthetic complications  Airway Mallampati: III  TM Distance: <3 FB Neck ROM: full    Dental  (+) Chipped, Poor Dentition, Missing   Pulmonary neg pulmonary ROS   Pulmonary exam normal        Cardiovascular hypertension, + CAD, + Past MI, + Peripheral Vascular Disease and +CHF  Normal cardiovascular exam     Neuro/Psych  Headaches PSYCHIATRIC DISORDERS         GI/Hepatic Neg liver ROS,GERD  Controlled,,  Endo/Other  negative endocrine ROS    Renal/GU negative Renal ROS  negative genitourinary   Musculoskeletal   Abdominal   Peds  Hematology negative hematology ROS (+)   Anesthesia Other Findings Patient is NPO appropriate and reports no nausea or vomiting today.  Patient has hoarse voice at baseline that he attributes to a previous intubation   Past Medical History: No date: CAD (coronary artery disease)     Comment:  a. 01/2011 Anterior STEMI/Cath/PCI: LM nl, LAD 100d               (3.5x8mm Vision BMS placed), LCX 55m, RI 50, RCA min               irregs, EF 40% w/ apical, inferoapical HK; b. 10/2022               NSTSEMI/Cath: LM nl, LAD 5p/m, RI 85, LCX 161m, OM1 mild               dzs, OM3 100 fills via L->L collats from dLAD, RCA 80p,               168m, RPDA fills via collats from LAD. EF 45-50%-->Med               Rx. No date: Cardiac arrest - ventricular fibrillation     Comment:  a. In setting of STEMI 01/2011 No date: Chronic pain No date: Cocaine abuse (HCC) No date: Depression with anxiety No date: ETOH abuse     Comment:  a. 6-12 beers/day No date: GERD (gastroesophageal reflux disease) No date: GI bleed No date: Hemorrhoids No date:  Hyperlipidemia No date: Hypertension No date: Ischemic cardiomyopathy     Comment:  a. 06/2011 Echo: EF 45-50%, No rwma; b. 10/2022 Echo: EF               55-60%, no rwma, nl RV fxn. No date: Marijuana abuse No date: Migraines No date: Tobacco abuse     Comment:  a. 1/2 ppd x 26 yrs  Past Surgical History: 08/09/2021: COLONOSCOPY     Comment:  Procedure: COLONOSCOPY;  Surgeon: Midge Minium, MD;                Location: [facility] ENDOSCOPY;  Service: Endoscopy;; No date: CORONARY ANGIOPLASTY WITH STENT PLACEMENT 04/08/2023: ESOPHAGEAL BANDING     Comment:  Procedure: ESOPHAGEAL BANDING;  Surgeon: Tressia Danas, MD;  Location: [facility] ENDOSCOPY;  Service:               Gastroenterology;; 05/30/2022: ESOPHAGOGASTRODUODENOSCOPY; N/A     Comment:  Procedure: ESOPHAGOGASTRODUODENOSCOPY (EGD);  Surgeon:  Toney Reil, MD;  Location: [facility] ENDOSCOPY;                Service: Gastroenterology;  Laterality: N/A; 03/01/2021: ESOPHAGOGASTRODUODENOSCOPY (EGD) WITH PROPOFOL; N/A     Comment:  Procedure: ESOPHAGOGASTRODUODENOSCOPY (EGD) WITH               PROPOFOL;  Surgeon: Toney Reil, MD;  [facility]               [facility];  Service: Endoscopy;  Laterality:               N/A; 08/09/2021: ESOPHAGOGASTRODUODENOSCOPY (EGD) WITH PROPOFOL; N/A     Comment:  Procedure: ESOPHAGOGASTRODUODENOSCOPY (EGD) WITH               PROPOFOL;  Surgeon: Midge Minium, MD;  Location: [facility]               ENDOSCOPY;  Service: Endoscopy;  Laterality: N/A; 03/01/2023: ESOPHAGOGASTRODUODENOSCOPY (EGD) WITH PROPOFOL; N/A     Comment:  Procedure: ESOPHAGOGASTRODUODENOSCOPY (EGD) WITH               PROPOFOL;  Surgeon: Jaynie Collins, DO;  Location:              [facility] ENDOSCOPY;  Service: Gastroenterology;  Laterality:               N/A; 04/08/2023: ESOPHAGOGASTRODUODENOSCOPY (EGD) WITH PROPOFOL; N/A     Comment:  Procedure: ESOPHAGOGASTRODUODENOSCOPY (EGD) WITH                PROPOFOL;  Surgeon: Tressia Danas, MD;  Location: [facility]               ENDOSCOPY;  Service: Gastroenterology;  Laterality: N/A; 08/09/2023: ESOPHAGOGASTRODUODENOSCOPY (EGD) WITH PROPOFOL; N/A     Comment:  Procedure: ESOPHAGOGASTRODUODENOSCOPY (EGD) WITH               PROPOFOL;  Surgeon: Regis Bill, MD;  Location:               [facility] ENDOSCOPY;  Service: Endoscopy;  Laterality: N/A; 10/31/2022: EVALUATION UNDER ANESTHESIA WITH HEMORRHOIDECTOMY; N/A     Comment:  Procedure: EXAM UNDER ANESTHESIA WITH SUTURE LIGATION OF              TWO BLEEDING PEDICLES;  Surgeon: Henrene Dodge, MD;                Location: [facility] ORS;  Service: General;  Laterality: N/A; 10/02/2022: FLEXIBLE SIGMOIDOSCOPY; N/A     Comment:  Procedure: FLEXIBLE SIGMOIDOSCOPY;  Surgeon: Meryl Dare, MD;  Location: [facility] ENDOSCOPY;  Service:               Gastroenterology;  Laterality: N/A; 08/09/2023: HEMOSTASIS CONTROL     Comment:  Procedure: HEMOSTASIS CONTROL;  Surgeon: Regis Bill, MD;  Location: [facility] ENDOSCOPY;  Service:               Endoscopy;; 04/11/2023: IR TIPS 04/11/2023: IR US GUIDE VASC ACCESS RIGHT 04/11/2023: IR US GUIDE VASC ACCESS RIGHT 04/11/2023: IR US GUIDE VASC ACCESS RIGHT 11/04/2022: LEFT HEART CATH AND CORONARY ANGIOGRAPHY; N/A     Comment:  Procedure: LEFT HEART CATH AND CORONARY ANGIOGRAPHY;                Surgeon: Iran Ouch,  MD;  Location: [facility] INVASIVE               CV LAB;  Service: Cardiovascular;  Laterality: N/A; 04/11/2023: RADIOLOGY WITH ANESTHESIA; N/A     Comment:  Procedure: IR WITH ANESTHESIA;  Surgeon: Oley Balm, MD;  Location: [facility] OR;  Service: Radiology;                Laterality: N/A; 10/24/2022: VISCERAL ANGIOGRAPHY; N/A     Comment:  Procedure: VISCERAL ANGIOGRAPHY;  Surgeon: Annice Needy,              MD;  Location: [facility] INVASIVE CV LAB;  Service:               Cardiovascular;  Laterality: N/A; 05/09/2023:  VISCERAL ARTERY INTERVENTION; N/A     Comment:  Procedure: VISCERAL ARTERY INTERVENTION;  Surgeon:               Renford Dills, MD;  Location: [facility] INVASIVE CV LAB;               Service: Cardiovascular;  Laterality: N/A;  BMI    Body Mass Index: 34.05 kg/m      Reproductive/Obstetrics negative OB ROS                             Anesthesia Physical Anesthesia Plan  ASA: 3  Anesthesia Plan: General   Post-op Pain Management:    Induction: Intravenous  PONV Risk Score and Plan: Propofol infusion and TIVA  Airway Management Planned: Natural Airway and Nasal Cannula  Additional Equipment:   Intra-op Plan:   Post-operative Plan:   Informed Consent: I have reviewed the patients History and Physical, chart, labs and discussed the procedure including the risks, benefits and alternatives for the proposed anesthesia with the patient or authorized representative who has indicated his/her understanding and acceptance.     Dental Advisory Given  Plan Discussed with: Anesthesiologist, CRNA and Surgeon  Anesthesia Plan Comments: (Patient consented for risks of anesthesia including but not limited to:  - adverse reactions to medications - risk of airway placement if required - damage to eyes, teeth, lips or other oral mucosa - nerve damage due to positioning  - sore throat or hoarseness - Damage to heart, brain, nerves, lungs, other parts of body or loss of life  Patient voiced understanding.)       Anesthesia Quick Evaluation

## 2023-08-19 NOTE — Progress Notes (Signed)
PT Cancellation Note  Patient Details Name: LUISALBERTO HEIMBERGER MRN: [id number] DOB: 1971-05-26   Cancelled Treatment:    Reason Eval/Treat Not Completed: Patient at procedure or test/unavailable, will attempt to see pt at a future date/time as medically appropriate.    Ovidio Hanger PT, DPT 08/19/23, 1:51 PM

## 2023-08-19 NOTE — Progress Notes (Signed)
OT Cancellation Note  Patient Details Name: Ethan Wells MRN: [id number] DOB: [birth year]   Cancelled Treatment:    Reason Eval/Treat Not Completed: Other (comment). Chart reviewed. Note pt transferred from 1C to 2A. Also scheduled for EGD today. Given change in patient status requiring transfer to higher level of care will require new OT orders as appropriate to continue working with pt. MD notified.   Arman Filter., MPH, MS, OTR/L ascom [phone] 08/19/23, 1:41 PM

## 2023-08-20 ENCOUNTER — Encounter: Payer: Self-pay | Admitting: Gastroenterology

## 2023-08-20 DIAGNOSIS — G9341 Metabolic encephalopathy: Secondary | ICD-10-CM | POA: Diagnosis not present

## 2023-08-20 DIAGNOSIS — K7682 Hepatic encephalopathy: Secondary | ICD-10-CM | POA: Diagnosis not present

## 2023-08-20 DIAGNOSIS — E2740 Unspecified adrenocortical insufficiency: Secondary | ICD-10-CM | POA: Diagnosis not present

## 2023-08-20 LAB — CBC
HCT: 24.4 % — ABNORMAL LOW (ref 39.0–52.0)
Hemoglobin: 7.9 g/dL — ABNORMAL LOW (ref 13.0–17.0)
MCH: 30.3 pg (ref 26.0–34.0)
MCHC: 32.4 g/dL (ref 30.0–36.0)
MCV: 93.5 fL (ref 80.0–100.0)
Platelets: 77 10*3/uL — ABNORMAL LOW (ref 150–400)
RBC: 2.61 MIL/uL — ABNORMAL LOW (ref 4.22–5.81)
RDW: 17.7 % — ABNORMAL HIGH (ref 11.5–15.5)
WBC: 5.7 10*3/uL (ref 4.0–10.5)
nRBC: 0 % (ref 0.0–0.2)

## 2023-08-20 LAB — MAGNESIUM: Magnesium: 2 mg/dL (ref 1.7–2.4)

## 2023-08-20 LAB — PHOSPHORUS: Phosphorus: 3.7 mg/dL (ref 2.5–4.6)

## 2023-08-20 MED ORDER — OXYCODONE HCL 5 MG PO TABS
5.0000 mg | ORAL_TABLET | Freq: Four times a day (QID) | ORAL | Status: DC | PRN
  Administered 2023-08-20 – 2023-08-24 (×10): 5 mg via ORAL
  Filled 2023-08-20 (×11): qty 1

## 2023-08-20 NOTE — Plan of Care (Signed)
  Problem: Education: Goal: Knowledge of General Education information will improve Description: Including pain rating scale, medication(s)/side effects and non-pharmacologic comfort measures Outcome: Progressing   Problem: Health Behavior/Discharge Planning: Goal: Ability to manage health-related needs will improve Outcome: Progressing   Problem: Clinical Measurements: Goal: Ability to maintain clinical measurements within normal limits will improve Outcome: Progressing Goal: Will remain free from infection Outcome: Progressing Goal: Diagnostic test results will improve Outcome: Progressing Goal: Respiratory complications will improve Outcome: Progressing Goal: Cardiovascular complication will be avoided Outcome: Progressing   Problem: Elimination: Goal: Will not experience complications related to bowel motility Outcome: Progressing Goal: Will not experience complications related to urinary retention Outcome: Progressing   Problem: Coping: Goal: Level of anxiety will decrease Outcome: Progressing   Problem: Pain Managment: Goal: General experience of comfort will improve Outcome: Progressing   Problem: Skin Integrity: Goal: Risk for impaired skin integrity will decrease Outcome: Progressing   Problem: Safety: Goal: Ability to remain free from injury will improve Outcome: Progressing

## 2023-08-20 NOTE — Evaluation (Signed)
Occupational Therapy Re-Evaluation Patient Details Name: Ethan Wells MRN: [id number] DOB: 1970-12-29 Today's Date: 08/20/2023   History of Present Illness Pt is a 52 y.o. male with significant PMH of alcohol abuse, alcoholic liver cirrhosis with portal gastropathy, s/p TIPS, superior mesenteric artery stenosis, HTN, HLD, CAD, STEMI, dCHF, GERD, depression with anxiety, adrenal insufficiency (Addison's disease), migraine headache, cardiac arrest due to V-fib, recurrent GI bleeding, cocaine abuse, tobacco abuse, who presented to the ED with rectal bleeding.   Clinical Impression   Pt was seen for OT re-evaluation this date (transferred to 242, new orders received). Prior to hospital admission, pt was MOD I with ADLs (difficult to perform, but he has no one to help him). Reports use of RW occasionally for mobility. Pt lives on mother's property, sleeping in his truck. Pt presents to acute OT demonstrating impaired ADL performance and functional mobility 2/2 decreased ind in self care, balance and functional mobility, endurance with decreased safety awareness (See OT problem list for additional functional deficits). Pt currently requires CGA for standing LB dressing/bathing, t/f bathroom for continent void with CGA + RW. Perseverates on needing pain meds throughout session (8/10 pain, generalized "everywhere", RN notified). Pt would benefit from skilled OT services to address noted impairments and functional limitations (see below for any additional details) in order to maximize safety and independence while minimizing falls risk and caregiver burden. Pt progressing towards goals, goal timeline updated. Do not anticipate the need for follow up OT services upon acute hospital DC.  Pt refusing bed alarm. Left sitting EOB, call bell within reach and needs met.       If plan is discharge home, recommend the following: A little help with walking and/or transfers;A little help with  bathing/dressing/bathroom;Assistance with cooking/housework;Assist for transportation;Help with stairs or ramp for entrance;Direct supervision/assist for financial management;Direct supervision/assist for medications management       Equipment Recommendations  None recommended by OT       Precautions / Restrictions Precautions Precautions: Fall Restrictions Weight Bearing Restrictions: No      Mobility Bed Mobility Overal bed mobility: Modified Independent Bed Mobility: Supine to Sit, Sit to Supine Rolling: Modified independent (Device/Increase time)   Supine to sit: Modified independent (Device/Increase time) Sit to supine: Modified independent (Device/Increase time)   General bed mobility comments: increased time    Transfers Overall transfer level: Needs assistance Equipment used: Rolling walker (2 wheels) Transfers: Sit to/from Stand Sit to Stand: Contact guard assist           General transfer comment: CGA t/f bathroom      Balance Overall balance assessment: Needs assistance Sitting-balance support: No upper extremity supported, Feet supported Sitting balance-Leahy Scale: Normal     Standing balance support: During functional activity, Bilateral upper extremity supported Standing balance-Leahy Scale: Good Standing balance comment: t/f bathroom no LOB                           ADL either performed or assessed with clinical judgement   ADL Overall ADL's : Needs assistance/impaired     Grooming: Wash/dry hands;Wash/dry face;Standing;Contact guard assist       Lower Body Bathing: Contact guard assist;Sit to/from stand Lower Body Bathing Details (indicate cue type and reason): dons socks seated EOB in figure four, sponge bathes in standing     Lower Body Dressing: Contact guard assist;Sit to/from stand Lower Body Dressing Details (indicate cue type and reason): dons underwear Toilet Transfer: Contact guard assist;Rolling  walker (2  wheels) Toilet Transfer Details (indicate cue type and reason): ambulating Toileting- Clothing Manipulation and Hygiene: Contact guard assist;Cueing for safety;Sit to/from stand Toileting - Clothing Manipulation Details (indicate cue type and reason): standing continent void of urine in bathroom     Functional mobility during ADLs: Contact guard assist;Rolling walker (2 wheels) General ADL Comments: Pt performing standing/sitting bathing tasks, t/f bathroom for void with RW. CGA overall for tasks, pt SOB and perseverating on medications throughout. VSS.      Pertinent Vitals/Pain Pain Assessment Pain Assessment: 0-10 Pain Score: 8  Pain Location: "everywhere", requesting oxy and gaba     Extremity/Trunk Assessment Upper Extremity Assessment Upper Extremity Assessment: Generalized weakness (mild tremors BUE)   Lower Extremity Assessment Lower Extremity Assessment: Generalized weakness       Communication     Cognition Arousal: Alert Behavior During Therapy: Flat affect Overall Cognitive Status: No family/caregiver present to determine baseline cognitive functioning                                 General Comments: Cues with increased time, somewhat perseverates on medicine       OT Goals(Current goals can be found in the care plan section) Acute Rehab OT Goals Patient Stated Goal: to go home OT Goal Formulation: With patient Time For Goal Achievement: 08/29/23 Potential to Achieve Goals: Fair ADL Goals Pt Will Perform Grooming: with modified independence;standing Pt Will Perform Lower Body Dressing: with modified independence;sit to/from stand Pt Will Transfer to Toilet: with modified independence;ambulating Pt Will Perform Toileting - Clothing Manipulation and hygiene: with modified independence;sit to/from stand  OT Frequency: Min 1X/week       AM-PAC OT "6 Clicks" Daily Activity     Outcome Measure Help from another person eating meals?:  None Help from another person taking care of personal grooming?: None Help from another person toileting, which includes using toliet, bedpan, or urinal?: A Little Help from another person bathing (including washing, rinsing, drying)?: A Little Help from another person to put on and taking off regular upper body clothing?: None Help from another person to put on and taking off regular lower body clothing?: A Little 6 Click Score: 21   End of Session Equipment Utilized During Treatment: Rolling walker (2 wheels) Nurse Communication: Mobility status;Patient requests pain meds;Other (comment) (notified of pt's request of pain meds, decline of bed alarm)  Activity Tolerance: Patient tolerated treatment well Patient left: in bed;with call bell/phone within reach;Other (comment) (pt refusing bed alarm (RN notified))  OT Visit Diagnosis: Unsteadiness on feet (R26.81);Repeated falls (R29.6);Muscle weakness (generalized) (M62.81)                Time: 0981-1914 OT Time Calculation (min): 34 min Charges:  OT General Charges $OT Visit: 1 Visit OT Evaluation $OT Re-eval: 1 Re-eval OT Treatments $Self Care/Home Management : 8-22 mins  Meriam Chojnowski L. Analy [place], OTR/L  08/20/23, 9:55 AM

## 2023-08-20 NOTE — Progress Notes (Signed)
Physical Therapy Treatment Patient Details Name: Ethan Wells MRN: [id number] DOB: 1971/03/12 Today's Date: 08/20/2023   History of Present Illness Pt is a 52 y.o. male with significant PMH of alcohol abuse, alcoholic liver cirrhosis with portal gastropathy, s/p TIPS, superior mesenteric artery stenosis, HTN, HLD, CAD, STEMI, dCHF, GERD, depression with anxiety, adrenal insufficiency (Addison's disease), migraine headache, cardiac arrest due to V-fib, recurrent GI bleeding, cocaine abuse, tobacco abuse, who presented to the ED with rectal bleeding.    PT Comments  Pt found supine in bed, pt is pleasant, and with cues and encouragement willing to walk with PT but denied any further activity or mobilization. Pt is Mod I for bed mobility only requiring increased time. Once up, pt able to walk lap around unit with RW and CGA level, pt steady with steps throughout, noting no imbalance. PT does note labored breathing but pt denies needing rest break; HR and SpO2 remained within functional ranges for activity performed. Pt reports gait is slightly slower than his normal speed. Pt will benefit from continued PT services upon discharge to safely address deficits listed in patient problem list for decreased caregiver assistance and eventual return to PLOF.    If plan is discharge home, recommend the following: A little help with walking and/or transfers;Help with stairs or ramp for entrance;Direct supervision/assist for financial management;A little help with bathing/dressing/bathroom   Can travel by private vehicle        Equipment Recommendations  Other (comment) (TBD)    Recommendations for Other Services       Precautions / Restrictions Precautions Precautions: Fall Restrictions Weight Bearing Restrictions: No     Mobility  Bed Mobility Overal bed mobility: Modified Independent Bed Mobility: Supine to Sit, Sit to Supine     Supine to sit: Modified independent (Device/Increase  time) Sit to supine: Modified independent (Device/Increase time)   General bed mobility comments: increased time    Transfers Overall transfer level: Needs assistance Equipment used: Rolling walker (2 wheels) Transfers: Sit to/from Stand Sit to Stand: Supervision                Ambulation/Gait Ambulation/Gait assistance: Contact guard assist Gait Distance (Feet): 200 Feet Assistive device: Rolling walker (2 wheels) Gait Pattern/deviations: Trunk flexed, Decreased step length - right, Decreased step length - left, Step-through pattern Gait velocity: decreased     General Gait Details: Overall slowed, PT noted some labored breathing but pt denies needing to rest or stop.   Stairs             Wheelchair Mobility     Tilt Bed    Modified Rankin (Stroke Patients Only)       Balance Overall balance assessment: Needs assistance Sitting-balance support: No upper extremity supported, Feet supported Sitting balance-Leahy Scale: Normal     Standing balance support: During functional activity, Bilateral upper extremity supported Standing balance-Leahy Scale: Good Standing balance comment: static standing with no AD, RW nearby.                            Cognition Arousal: Alert Behavior During Therapy: Flat affect Overall Cognitive Status: No family/caregiver present to determine baseline cognitive functioning                                          Exercises      General  Comments        Pertinent Vitals/Pain Pain Assessment Pain Assessment: Faces Faces Pain Scale: Hurts little more Pain Location: Generalized Pain Descriptors / Indicators: Aching, Discomfort    Home Living                          Prior Function            PT Goals (current goals can now be found in the care plan section) Progress towards PT goals: Progressing toward goals    Frequency    Min 1X/week      PT Plan       Co-evaluation              AM-PAC PT "6 Clicks" Mobility   Outcome Measure  Help needed turning from your back to your side while in a flat bed without using bedrails?: None Help needed moving from lying on your back to sitting on the side of a flat bed without using bedrails?: None Help needed moving to and from a bed to a chair (including a wheelchair)?: A Little Help needed standing up from a chair using your arms (e.g., wheelchair or bedside chair)?: A Little Help needed to walk in hospital room?: A Little Help needed climbing 3-5 steps with a railing? : A Little 6 Click Score: 20    End of Session Equipment Utilized During Treatment: Gait belt Activity Tolerance: Patient tolerated treatment well Patient left: in bed;with call bell/phone within reach Nurse Communication: Mobility status PT Visit Diagnosis: Unsteadiness on feet (R26.81);Repeated falls (R29.6);History of falling (Z91.81);Pain Pain - Right/Left:  (generalized) Pain - part of body:  (generalized)     Time: 5638-7564 PT Time Calculation (min) (ACUTE ONLY): 14 min  Charges:                            Cecile Sheerer, SPT 08/20/23, 1:32 PM

## 2023-08-20 NOTE — Progress Notes (Signed)
Progress Note   [name] WUJ:[id number] DOB: 08/07/1971 DOA: 08/08/2023     12 DOS: the patient was seen and examined on 08/20/2023   Brief hospital course:  52 y.o. male with past medical history of alcohol abuse, alcoholic liver cirrhosis with portal gastropathy, s/p TIPS, superior mesenteric artery stenosis s/p stenting, HTN, HLD, CAD, STEMI, chronic diastolic CHF, GERD, depression with anxiety, adrenal insufficiency (Addison's disease), migraine headache, cardiac arrest due to V-fib, recurrent GI bleeding, cocaine abuse, tobacco abuse  was initially admitted to ICU on 08/07/2023 with Hemorrhagic shock due to Acute GI bleed, requiring vasopressors.  Required transfusion of 2 units pRBC's.  Seen by GI and underwent EGD with findings of non-bleeding duodenal ulcers, one area treated with Purastat for concern of a healing, recently bleeding vessel. Currently on IV PPI twice daily.  TRH assumed care 08/10/2023.    Hospital course complicated by hypotension and soft BPs, acute withdrawal syndrome with acute hepatic encephalopathy.  Lactulose dose was increased on 08/16/2023.  2 units PRBCs were ordered to be transfused for hemoglobin of 6.8 on 08/18/2023.  GI was made aware and planned for repeat EGD on 08/19/2023.   08/20/2023: Pt seen this AM, reports feeling better overall.  Feels weak and tired in general.  No specific acute complaints.  No abdominal pain or signs of bleeding.    Assessment and Plan:  # Resolved hypovolemic/Hemorrhagic Shock BPs are stable Currently on midodrine 10 mg 3 times daily, PO Cortef, received 2 doses of IV albumin on 08/17/2023. Continue to monitor vital signs, goal MAP greater than 65.   #Acute blood loss anemia in the setting of acute upper GI bleed #Thrombocytopenia Alcoholic Cirrhosis s/p TIPS 4/27, duodenal ulcers, portal hypertension gastropathy, seen on EGD done on 08/09/2023 and 08/19/2023. CT Angio GI Bleed  showed Active GI bleed in the region of  the gastric antrum/pylorus and proximal duodenum,  Cirrhosis of liver with patent appearing TIPS. GI Dr. Mia Creek and IR oncall Dr. Fredia Sorrow both reviewed and agreed does not look like a variceal bleed by CTA.   P.o. PPI switched to IV twice daily Completed 7-day course of IV antibiotics Continue to hold off antiplatelets, Plavix, and avoid all NSAIDs. On rifaximin and lactulose due to recent hepatic encephalopathy 2 unit PRBCs ordered to be transfused for hemoglobin of 6.8 on 08/18/23 Repeat EGD done on 08/19/2023. . Bilateral flank pain in the setting of chronic pain syndrome CT scan revealed possible cluster of kidney stones involving left kidney Renal ultrasound was nonacute.   # Resolved acute hepatic encephalopathy  # Acute withdrawal syndrome ammonia of 49-> 56.  Continue lactulose and rifaximin, continue CIWA protocol with as needed Ativan.   Ammonia level has normalized on 08/17/2023.  Mentation is back to baseline.  Alcoholic cirrhosis with Portal gastropathy S/p TIPS 4/27 Continue Rifaximin and lactulose.  Chronic diastolic congestive heart failure. Continue to monitor intake and output charting Daily weights.   #Non Anion Gap Metabolic Acidosis due Lactic Acidosis #Hypokalemia #Hypophosphatemia Mild hyponatremia.  Electrolytes repleted and normalized.   #Hx adrenal insufficiency  On Cortef.   A.m. cortisol of 1.9.  Thyroid function within normal range.   Continue midodrine dose to 10 mg 3 times daily.   Continue to closely monitor vital signs. Maintain MAP greater than 65   #Hx polysubstance abuse including EtOH -UDS+Cocaine, Last Drink on 08/06/23 --continue thiamine and folate CIWA protocol in place.  Tobacco abuse Possible COPD Nicotine patch DuoNebs every 6 hours  Physical debility  PT OT with fall precautions Out of bed to chair every shift    Physical Exam: Vitals:   08/20/23 0019 08/20/23 0455 08/20/23 0743 08/20/23 1147  BP: (!) 94/54 104/63 108/73 (!)  90/37  Pulse: 92 90 96 96  Resp: 19 19 18 18   Temp: 99.1 F (37.3 C) 99.3 F (37.4 C) 98.2 F (36.8 C) 98.2 F (36.8 C)  TempSrc: Oral Oral    SpO2: 96% 98% 97% 97%  Weight:      Height:       Body mass index is 33.91 kg/m.   General exam: awake, appears drowsy, no acute distress, obese HEENT: moist mucus membranes, hearing grossly normal  Respiratory system: CTAB, no wheezes, rales or rhonchi, normal respiratory effort. Cardiovascular system: normal S1/S2, RRR, trace to 1+ BLE and pedal edema.   Gastrointestinal system: soft, NT, ND, no HSM felt, +bowel sounds. Central nervous system: A&O x3. no gross focal neurologic deficits, normal speech Extremities: moves all , no edema, normal tone Skin: dry, intact, normal temperature Psychiatry: normal mood, congruent affect    Data Reviewed: Laboratory and imaging data from last 24 hours reviewed.  Family Communication: none present at bedside.  Disposition: Status is: Inpatient  Remains inpatient appropriate because: Closely monitoring hypotension for BP stability, Hbg for stability given re-bleeding recently. .    Planned Discharge Destination: Home, in 1 to 2 days.  Time spent 35 minutes  Author: Pennie Banter, DO 08/20/2023 3:55 PM  For on call review [email or web address].

## 2023-08-20 NOTE — Progress Notes (Signed)
PHARMACY CONSULT NOTE - ELECTROLYTES  Pharmacy Consult for Electrolyte Monitoring and Replacement   Recent Labs: Height: 5\' 6"  (167.6 cm) Weight: 95.3 kg (210 lb 1.6 oz) IBW/kg (Calculated) : 63.8 Estimated Creatinine Clearance: 118 mL/min (by C-G formula based on SCr of 0.61 mg/dL).  Potassium (mmol/L)  Date Value  08/19/2023 3.7  09/06/2013 3.5   Magnesium (mg/dL)  Date Value  16/09/9603 2.0  03/09/2013 2.2   Calcium (mg/dL)  Date Value  54/08/8118 8.2 (L)   Calcium, Total (mg/dL)  Date Value  14/78/2956 8.9   Albumin (g/dL)  Date Value  21/30/8657 2.2 (L)  09/06/2013 4.0   Phosphorus (mg/dL)  Date Value  84/69/6295 3.7   Sodium (mmol/L)  Date Value  08/19/2023 141  09/06/2013 134 (L)   Corrected Ca: 10.56 mg/dL  Assessment  Ethan Wells is a 52 y.o. male presenting with acute GI bleed. PMH significant for alcohol abuse, alcoholic liver cirrhosis with portal gastropathy, s/p TIPS, superior mesenteric artery stenosis s/p stenting, HTN, HLD, CAD, STEMI, dCHF, GERD, depression with anxiety, adrenal insufficiency (Addison's disease), migraine headache, cardiac arrest due to V-fib, recurrent GI bleeding, cocaine abuse, tobacco abuse . Pharmacy has been consulted to monitor and replace electrolytes.  Goal of Therapy: Electrolytes WNL  Plan:  Electrolytes WNL - no replacement needed at this time Check electrolytes with AM labs  Thank you for allowing pharmacy to be a part of this patient's care.   Zaryia Markel Rodriguez-Guzman PharmD, BCPS 08/20/2023 7:48 AM

## 2023-08-21 DIAGNOSIS — K7030 Alcoholic cirrhosis of liver without ascites: Secondary | ICD-10-CM | POA: Diagnosis not present

## 2023-08-21 DIAGNOSIS — R601 Generalized edema: Secondary | ICD-10-CM | POA: Diagnosis not present

## 2023-08-21 DIAGNOSIS — E2740 Unspecified adrenocortical insufficiency: Secondary | ICD-10-CM | POA: Diagnosis not present

## 2023-08-21 DIAGNOSIS — K7682 Hepatic encephalopathy: Secondary | ICD-10-CM | POA: Diagnosis not present

## 2023-08-21 LAB — PHOSPHORUS: Phosphorus: 3.6 mg/dL (ref 2.5–4.6)

## 2023-08-21 LAB — CBC
HCT: 25.8 % — ABNORMAL LOW (ref 39.0–52.0)
Hemoglobin: 8.4 g/dL — ABNORMAL LOW (ref 13.0–17.0)
MCH: 29.8 pg (ref 26.0–34.0)
MCHC: 32.6 g/dL (ref 30.0–36.0)
MCV: 91.5 fL (ref 80.0–100.0)
Platelets: 103 10*3/uL — ABNORMAL LOW (ref 150–400)
RBC: 2.82 MIL/uL — ABNORMAL LOW (ref 4.22–5.81)
RDW: 18 % — ABNORMAL HIGH (ref 11.5–15.5)
WBC: 7.8 10*3/uL (ref 4.0–10.5)
nRBC: 0 % (ref 0.0–0.2)

## 2023-08-21 LAB — COMPREHENSIVE METABOLIC PANEL
ALT: 32 U/L (ref 0–44)
AST: 41 U/L (ref 15–41)
Albumin: 2.8 g/dL — ABNORMAL LOW (ref 3.5–5.0)
Alkaline Phosphatase: 122 U/L (ref 38–126)
Anion gap: 8 (ref 5–15)
BUN: 8 mg/dL (ref 6–20)
CO2: 22 mmol/L (ref 22–32)
Calcium: 8.6 mg/dL — ABNORMAL LOW (ref 8.9–10.3)
Chloride: 109 mmol/L (ref 98–111)
Creatinine, Ser: 0.56 mg/dL — ABNORMAL LOW (ref 0.61–1.24)
GFR, Estimated: >60 mL/min (ref >60)
Glucose, Bld: 123 mg/dL — ABNORMAL HIGH (ref 70–99)
Potassium: 4 mmol/L (ref 3.5–5.1)
Sodium: 139 mmol/L (ref 135–145)
Total Bilirubin: 2.5 mg/dL — ABNORMAL HIGH (ref 0.3–1.2)
Total Protein: 5.6 g/dL — ABNORMAL LOW (ref 6.5–8.1)

## 2023-08-21 LAB — MAGNESIUM: Magnesium: 1.9 mg/dL (ref 1.7–2.4)

## 2023-08-21 LAB — AMMONIA: Ammonia: 51 umol/L — ABNORMAL HIGH (ref 9–35)

## 2023-08-21 MED ORDER — ALBUMIN HUMAN 25 % IV SOLN
12.5000 g | Freq: Two times a day (BID) | INTRAVENOUS | Status: DC
  Administered 2023-08-21 – 2023-08-25 (×8): 12.5 g via INTRAVENOUS
  Filled 2023-08-21 (×9): qty 50

## 2023-08-21 MED ORDER — FUROSEMIDE 10 MG/ML IJ SOLN
20.0000 mg | Freq: Two times a day (BID) | INTRAMUSCULAR | Status: DC
  Administered 2023-08-21 – 2023-08-25 (×8): 20 mg via INTRAVENOUS
  Filled 2023-08-21: qty 4
  Filled 2023-08-21: qty 2
  Filled 2023-08-21 (×6): qty 4

## 2023-08-21 MED ORDER — LORAZEPAM 1 MG PO TABS
1.0000 mg | ORAL_TABLET | Freq: Two times a day (BID) | ORAL | Status: DC | PRN
  Administered 2023-08-21 – 2023-08-24 (×5): 1 mg via ORAL
  Filled 2023-08-21 (×6): qty 1

## 2023-08-21 NOTE — Progress Notes (Signed)
PT Cancellation Note  Patient Details Name: Ethan Wells MRN: [id number] DOB: 1971/01/11   Cancelled Treatment:    Reason Eval/Treat Not Completed: Other (comment) Pt laying in bed, lethargic with mumbling speech.. Refuses activity/ambulation stating he just needs to rest.  Of note he got Ativan ~1hour prior. Will maintain on caseload and attempt as able.  Malachi Pro, DPT 08/21/2023, 10:42 AM

## 2023-08-21 NOTE — Progress Notes (Signed)
Progress Note   [name] AOZ:[id number] DOB: 08-16-1971 DOA: 08/08/2023     13 DOS: the patient was seen and examined on 08/21/2023   Brief hospital course:  52 y.o. male with past medical history of alcohol abuse, alcoholic liver cirrhosis with portal gastropathy, s/p TIPS, superior mesenteric artery stenosis s/p stenting, HTN, HLD, CAD, STEMI, chronic diastolic CHF, GERD, depression with anxiety, adrenal insufficiency (Addison's disease), migraine headache, cardiac arrest due to V-fib, recurrent GI bleeding, cocaine abuse, tobacco abuse  was initially admitted to ICU on 08/07/2023 with Hemorrhagic shock due to Acute GI bleed, requiring vasopressors.  Required transfusion of 2 units pRBC's.  Seen by GI and underwent EGD with findings of non-bleeding duodenal ulcers, one area treated with Purastat for concern of a healing, recently bleeding vessel. Currently on IV PPI twice daily.  TRH assumed care 08/10/2023.    Hospital course complicated by hypotension and soft BPs, acute withdrawal syndrome with acute hepatic encephalopathy.  Lactulose dose was increased on 08/16/2023.  2 units PRBCs were ordered to be transfused for hemoglobin of 6.8 on 08/18/2023.  GI was made aware and planned for repeat EGD on 08/19/2023.     08/21/2023: Pt seen this AM, more lethargic again today.  Note ammonia level rising again. He reports being very tired, wants to just rest in bed.  He reports pain from swelling in his sides, abdomen and groin. Feels like he won't be able function at home like this.  States the side swelling weaps fluids at times.   Assessment and Plan:  # Resolved hypovolemic/Hemorrhagic Shock BPs are stable Currently on midodrine 10 mg 3 times daily, PO Cortef, received 2 doses of IV albumin on 08/17/2023. Continue to monitor vital signs, goal MAP greater than 65.   #Acute blood loss anemia in the setting of acute upper GI bleed #Thrombocytopenia Alcoholic Cirrhosis s/p TIPS 4/27,  duodenal ulcers, portal hypertension gastropathy, seen on EGD done on 08/09/2023 and 08/19/2023. CT Angio GI Bleed  showed Active GI bleed in the region of the gastric antrum/pylorus and proximal duodenum,  Cirrhosis of liver with patent appearing TIPS. GI Dr. Mia Creek and IR oncall Dr. Fredia Sorrow both reviewed and agreed does not look like a variceal bleed by CTA.   P.o. PPI switched to IV twice daily Completed 7-day course of IV antibiotics Continue to hold off antiplatelets, Plavix, and avoid all NSAIDs. On rifaximin and lactulose due to recent hepatic encephalopathy 2 unit PRBCs ordered to be transfused for hemoglobin of 6.8 on 08/18/23 Repeat EGD done on 08/19/2023. . Anasarca -- bilateral flanks, groin, lower extremities edematous Due to liver cirrhosis, third-spacing with hypoalbuminemia. --Start IV Lasix 20 mg BID with IV albumin beforehand to support BP --Daily weights --Strict I/O's  Bilateral flank pain in the setting of chronic pain syndrome CT scan revealed possible cluster of kidney stones involving left kidney Renal ultrasound was nonacute.   # Resolved acute hepatic encephalopathy  # Acute withdrawal syndrome ammonia of 49-> 56.  Continue lactulose and rifaximin, continue CIWA protocol with as needed Ativan.   Ammonia level has normalized on 08/17/2023.  Mentation is back to baseline.  Alcoholic cirrhosis with Portal gastropathy S/p TIPS 4/27 Continue Rifaximin and lactulose.  Chronic diastolic congestive heart failure. Continue to monitor intake and output charting Daily weights.   #Non Anion Gap Metabolic Acidosis due Lactic Acidosis #Hypokalemia #Hypophosphatemia Mild hyponatremia.  Electrolytes repleted and normalized.   #Hx adrenal insufficiency  On Cortef.   A.m. cortisol of 1.9.  Thyroid function within normal range.   Continue midodrine dose to 10 mg 3 times daily.   Continue to closely monitor vital signs. Maintain MAP greater than 65   #Hx polysubstance abuse  including EtOH -UDS+Cocaine, Last Drink on 08/06/23 --continue thiamine and folate CIWA protocol in place.  Tobacco abuse Possible COPD Nicotine patch DuoNebs every 6 hours  Physical debility PT OT with fall precautions Out of bed to chair every shift    Physical Exam: Vitals:   08/21/23 0744 08/21/23 0927 08/21/23 1308 08/21/23 1343  BP: 103/62  (!) 99/48 (!) 108/59  Pulse: 88  80 84  Resp: 16  16 18   Temp: 97.8 F (36.6 C)  98.3 F (36.8 C) 98.1 F (36.7 C)  TempSrc:    Oral  SpO2: 100%  97% 100%  Weight:  93.7 kg    Height:       Body mass index is 33.34 kg/m.   General exam: lethargic, no acute distress, obese HEENT: keeps eyes closed, moist mucus membranes, hearing grossly normal  Respiratory system: CTAB, no wheezes, rales or rhonchi, normal respiratory effort. Cardiovascular system: normal S1/S2, RRR Gastrointestinal system: soft, superficial tenderness at flank area dues to dependent edema, non-distended Central nervous system: A&O x3. no gross focal neurologic deficits, normal speech Extremities: moves all , progressive anasarca, normal tone Skin: indurated swelling of bilateral flanks / trunk and groin Psychiatry: normal mood, congruent affect    Data Reviewed: Notable labs ---  Glucose 123, Cr 0.56, Ca 8.6, albumin 2.8, Tprotein 5.6,  AMMONIA 51 up Tbili 2.5 Hbg improved 8.4, platelets improved 103  Family Communication: none present at bedside.  Disposition: Status is: Inpatient  Remains inpatient appropriate because: Requiring IV diuresis due to progressive anasarca, more lethargic again today    Planned Discharge Destination: Home, in 1 to 2 days.  Time spent 45 minutes  Author: Pennie Banter, DO 08/21/2023 2:27 PM  For on call review [email or web address].

## 2023-08-21 NOTE — Progress Notes (Signed)
PHARMACY CONSULT NOTE - ELECTROLYTES  Pharmacy Consult for Electrolyte Monitoring and Replacement   Recent Labs: Height: 5\' 6"  (167.6 cm) Weight: 95.3 kg (210 lb 1.6 oz) IBW/kg (Calculated) : 63.8 Estimated Creatinine Clearance: 118 mL/min (A) (by C-G formula based on SCr of 0.56 mg/dL (L)).  Potassium (mmol/L)  Date Value  08/21/2023 4.0  09/06/2013 3.5   Magnesium (mg/dL)  Date Value  40/98/1191 1.9  03/09/2013 2.2   Calcium (mg/dL)  Date Value  47/82/9562 8.6 (L)   Calcium, Total (mg/dL)  Date Value  13/07/6577 8.9   Albumin (g/dL)  Date Value  46/96/2952 2.8 (L)  09/06/2013 4.0   Phosphorus (mg/dL)  Date Value  84/13/2440 3.6   Sodium (mmol/L)  Date Value  08/21/2023 139  09/06/2013 134 (L)   Corrected Ca: 10.56 mg/dL  Assessment  ABDULSALAM Wells is a 52 y.o. male presenting with acute GI bleed. PMH significant for alcohol abuse, alcoholic liver cirrhosis with portal gastropathy, s/p TIPS, superior mesenteric artery stenosis s/p stenting, HTN, HLD, CAD, STEMI, dCHF, GERD, depression with anxiety, adrenal insufficiency (Addison's disease), migraine headache, cardiac arrest due to V-fib, recurrent GI bleeding, cocaine abuse, tobacco abuse . Pharmacy has been consulted to monitor and replace electrolytes.  Goal of Therapy: Electrolytes WNL  Plan:  Electrolytes WNL - no replacement needed at this time Check electrolytes with AM labs  Thank you for allowing pharmacy to be a part of this patient's care.   Trinitey Roache Rodriguez-Guzman PharmD, BCPS 08/21/2023 7:31 AM

## 2023-08-21 NOTE — Plan of Care (Signed)

## 2023-08-22 DIAGNOSIS — K7030 Alcoholic cirrhosis of liver without ascites: Secondary | ICD-10-CM | POA: Diagnosis not present

## 2023-08-22 DIAGNOSIS — E2740 Unspecified adrenocortical insufficiency: Secondary | ICD-10-CM | POA: Diagnosis not present

## 2023-08-22 DIAGNOSIS — K7682 Hepatic encephalopathy: Secondary | ICD-10-CM | POA: Diagnosis not present

## 2023-08-22 DIAGNOSIS — R601 Generalized edema: Secondary | ICD-10-CM | POA: Diagnosis not present

## 2023-08-22 LAB — BASIC METABOLIC PANEL
Anion gap: 9 (ref 5–15)
BUN: 7 mg/dL (ref 6–20)
CO2: 24 mmol/L (ref 22–32)
Calcium: 8.8 mg/dL — ABNORMAL LOW (ref 8.9–10.3)
Chloride: 107 mmol/L (ref 98–111)
Creatinine, Ser: 0.63 mg/dL (ref 0.61–1.24)
GFR, Estimated: >60 mL/min (ref >60)
Glucose, Bld: 115 mg/dL — ABNORMAL HIGH (ref 70–99)
Potassium: 3.7 mmol/L (ref 3.5–5.1)
Sodium: 140 mmol/L (ref 135–145)

## 2023-08-22 MED ORDER — POTASSIUM CHLORIDE CRYS ER 20 MEQ PO TBCR
20.0000 meq | EXTENDED_RELEASE_TABLET | Freq: Once | ORAL | Status: AC
  Administered 2023-08-22: 20 meq via ORAL
  Filled 2023-08-22: qty 1

## 2023-08-22 NOTE — TOC Progression Note (Signed)
Transition of Care [employer]) - Progression Note    Patient Details  Name: Ethan Wells MRN: [id number] Date of Birth: March 14, 1971  [facility] CM/SW Contact  Marlowe Sax, RN Phone Number: 08/22/2023, 12:28 PM  Clinical Narrative:     Spoke with the patient  He lives in his Truck in his mothers driveway at [address], he will return there and will need a Conservation officer, nature at [employer], he requested shoes a size 9-10, I provided him a pair of sandals from our Cabin crew and he signed the sheet,  He does get a Radio producer and has things in storage awaiting getting a house, he stated he is on a wait list  No additional Needs Identified   Expected Discharge Plan: Home/Self Care Barriers to Discharge: Continued Medical Work up  Expected Discharge Plan and Services   Discharge Planning Services: CM Consult Post Acute Care Choice: NA Living arrangements for the past 2 months: Homeless                 DME Arranged: N/A                     Social Determinants of Health (SDOH) Interventions SDOH Screenings   Food Insecurity: No Food Insecurity (08/09/2023)  Housing: Patient Unable To Answer (08/09/2023)  Transportation Needs: No Transportation Needs (08/09/2023)  Utilities: Not At Risk (08/09/2023)  Alcohol Screen: Medium Risk (10/01/2022)  Tobacco Use: High Risk (08/19/2023)    Readmission Risk Interventions    08/11/2023   10:35 AM 02/24/2023    3:12 PM 01/17/2023    3:38 PM  Readmission Risk Prevention Plan  Transportation Screening Complete Complete Complete  Medication Review Oceanographer) Complete Complete Complete  PCP or Specialist appointment within 3-5 days of discharge Complete    SW Recovery Care/Counseling Consult Complete  Not Complete  SW Consult Not Complete Comments   lethargic  Palliative Care Screening Not Applicable Not Applicable Not Applicable  Skilled Nursing Facility Not Applicable Not Applicable Not  Applicable

## 2023-08-22 NOTE — Progress Notes (Signed)
PHARMACY CONSULT NOTE - ELECTROLYTES  Pharmacy Consult for Electrolyte Monitoring and Replacement   Recent Labs: Height: 5\' 6"  (167.6 cm) Weight: 93.7 kg (206 lb 9.1 oz) IBW/kg (Calculated) : 63.8 Estimated Creatinine Clearance: 117.1 mL/min (by C-G formula based on SCr of 0.63 mg/dL).  Potassium (mmol/L)  Date Value  08/22/2023 3.7  09/06/2013 3.5   Magnesium (mg/dL)  Date Value  16/09/9603 1.9  03/09/2013 2.2   Calcium (mg/dL)  Date Value  54/08/8118 8.8 (L)   Calcium, Total (mg/dL)  Date Value  14/78/2956 8.9   Albumin (g/dL)  Date Value  21/30/8657 2.8 (L)  09/06/2013 4.0   Phosphorus (mg/dL)  Date Value  84/69/6295 3.6   Sodium (mmol/L)  Date Value  08/22/2023 140  09/06/2013 134 (L)    Assessment  Ethan Wells is a 52 y.o. male presenting with acute GI bleed. PMH significant for alcohol abuse, alcoholic liver cirrhosis with portal gastropathy, s/p TIPS, superior mesenteric artery stenosis s/p stenting, HTN, HLD, CAD, STEMI, dCHF, GERD, depression with anxiety, adrenal insufficiency (Addison's disease), migraine headache, cardiac arrest due to V-fib, recurrent GI bleeding, cocaine abuse, tobacco abuse . Pharmacy has been consulted to monitor and replace electrolytes.  Patient is on lasix 20 mg IV BID + albumin 12.5 g BID.   Goal of Therapy: Electrolytes WNL  Plan:  Kcl 20 mEq x 1 F/u with AM labs.   Thank you for allowing pharmacy to be a part of this patient's care.   Paschal Dopp, PharmD, BCPS 08/22/2023 8:39 AM

## 2023-08-22 NOTE — Progress Notes (Signed)
Progress Note   [name] EXB:[id number] DOB: 03-14-1971 DOA: 08/08/2023     14 DOS: the patient was seen and examined on 08/22/2023   Brief hospital course:  52 y.o. male with past medical history of alcohol abuse, alcoholic liver cirrhosis with portal gastropathy, s/p TIPS, superior mesenteric artery stenosis s/p stenting, HTN, HLD, CAD, STEMI, chronic diastolic CHF, GERD, depression with anxiety, adrenal insufficiency (Addison's disease), migraine headache, cardiac arrest due to V-fib, recurrent GI bleeding, cocaine abuse, tobacco abuse  was initially admitted to ICU on 08/07/2023 with Hemorrhagic shock due to Acute GI bleed, requiring vasopressors.  Required transfusion of 2 units pRBC's.  Seen by GI and underwent EGD with findings of non-bleeding duodenal ulcers, one area treated with Purastat for concern of a healing, recently bleeding vessel. Currently on IV PPI twice daily.  TRH assumed care 08/10/2023.    Hospital course complicated by hypotension and soft BPs, acute withdrawal syndrome with acute hepatic encephalopathy.  Lactulose dose was increased on 08/16/2023.  2 units PRBCs were ordered to be transfused for hemoglobin of 6.8 on 08/18/2023.  GI was made aware and planned for repeat EGD on 08/19/2023.   Progressive anasarca requiring IV Lasix augmented with albumin for BP support.   08/22/2023: Pt seated edge of bed with therapist in room.  He reports high urine output.  Does not feel like swelling is any different.  He keeps eyes closed and makes not eye contact during conversation, looking down and away.  Says he still feels very tired. No other complaints.    Assessment and Plan:  # Resolved hypovolemic/Hemorrhagic Shock BPs are stable Currently on midodrine 10 mg 3 times daily, PO Cortef, received 2 doses of IV albumin on 08/17/2023. Continue to monitor vital signs, goal MAP greater than 65.   #Acute blood loss anemia in the setting of acute upper GI  bleed #Thrombocytopenia Alcoholic Cirrhosis s/p TIPS 4/27, duodenal ulcers, portal hypertension gastropathy, seen on EGD done on 08/09/2023 and 08/19/2023. CT Angio GI Bleed  showed Active GI bleed in the region of the gastric antrum/pylorus and proximal duodenum,  Cirrhosis of liver with patent appearing TIPS. GI Dr. Mia Creek and IR oncall Dr. Fredia Sorrow both reviewed and agreed does not look like a variceal bleed by CTA.   P.o. PPI switched to IV twice daily Completed 7-day course of IV antibiotics Continue to hold off antiplatelets, Plavix, and avoid all NSAIDs. On rifaximin and lactulose due to recent hepatic encephalopathy 2 unit PRBCs ordered to be transfused for hemoglobin of 6.8 on 08/18/23 Repeat EGD done on 08/19/2023. . Anasarca -- bilateral flanks, groin, lower extremities edematous Due to liver cirrhosis, third-spacing with hypoalbuminemia. --Continue IV Lasix 20 mg BID with IV albumin beforehand to support BP --Daily weights --Strict I/O's  Bilateral flank pain in the setting of chronic pain syndrome CT scan revealed possible cluster of kidney stones involving left kidney Renal ultrasound was nonacute.   # Resolved acute hepatic encephalopathy  # Acute withdrawal syndrome ammonia of 49-> 56.  Continue lactulose and rifaximin, continue CIWA protocol with as needed Ativan.   Ammonia level has normalized on 08/17/2023.  Mentation is back to baseline.  Alcoholic cirrhosis with Portal gastropathy S/p TIPS 4/27 Continue Rifaximin and lactulose.  Chronic diastolic congestive heart failure. Continue to monitor intake and output charting Daily weights.   #Non Anion Gap Metabolic Acidosis due Lactic Acidosis #Hypokalemia #Hypophosphatemia Mild hyponatremia.  Electrolytes repleted and normalized.   #Hx adrenal insufficiency  On Cortef.  A.m. cortisol of 1.9.  Thyroid function within normal range.   Continue midodrine dose to 10 mg 3 times daily.   Continue to closely monitor vital  signs. Maintain MAP greater than 65   #Hx polysubstance abuse including EtOH -UDS+Cocaine, Last Drink on 08/06/23 --continue thiamine and folate CIWA protocol in place.  Tobacco abuse Possible COPD Nicotine patch DuoNebs every 6 hours  Physical debility PT OT with fall precautions Out of bed to chair every shift    Physical Exam: Vitals:   08/22/23 0806 08/22/23 0841 08/22/23 0908 08/22/23 1520  BP:  (!) 95/51 (!) 98/57 (!) 105/54  Pulse:  90 92 83  Resp:  14 15 14   Temp:  98 F (36.7 C) 97.8 F (36.6 C) 98.6 F (37 C)  TempSrc:      SpO2: 100% 98% 98% 97%  Weight:      Height:       Body mass index is 33.34 kg/m.   General exam: awake but drowsy appearing, seated edge of bed, no acute distress, obese HEENT: keeps eyes closed, moist mucus membranes, hearing grossly normal  Respiratory system: CTAB, no wheezes, rales or rhonchi, normal respiratory effort. Cardiovascular system: normal S1/S2, RRR Gastrointestinal system: soft, flank swelling / induration is mildly improved since yesterday Central nervous system: A&O x3. no gross focal neurologic deficits, normal speech Extremities: moves all , anasarca mildly improved, normal tone Skin: indurated swelling of bilateral flanks / trunk and groin - irmpving, mildly erythematous left side/flank area but no differential warmth Psychiatry: normal mood, congruent affect    Data Reviewed: Notable labs ---  Glucose 115, Ca 8.8, AMMONIA 51 (9/5)   Family Communication: none present at bedside.  Disposition: Status is: Inpatient  Remains inpatient appropriate because: Requiring IV diuresis due to progressive anasarca    Planned Discharge Destination: Home, in 1 to 2 days.  Time spent 38 minutes  Author: Pennie Banter, DO 08/22/2023 4:19 PM  For on call review [email or web address].

## 2023-08-22 NOTE — Care Management Important Message (Signed)
Important Message  Patient Details  Name: Ethan Wells MRN: [id number] Date of Birth: Oct 15, 1971   Medicare Important Message Given:  Yes     [name] [name] 08/22/2023, 12:02 PM

## 2023-08-22 NOTE — Plan of Care (Signed)

## 2023-08-22 NOTE — Progress Notes (Signed)
Occupational Therapy Treatment Patient Details Name: Ethan Wells MRN: [id number] DOB: 02/17/1971 Today's Date: 08/22/2023   History of present illness Pt is a 52 y.o. male with significant PMH of alcohol abuse, alcoholic liver cirrhosis with portal gastropathy, s/p TIPS, superior mesenteric artery stenosis, HTN, HLD, CAD, STEMI, dCHF, GERD, depression with anxiety, adrenal insufficiency (Addison's disease), migraine headache, cardiac arrest due to V-fib, recurrent GI bleeding, cocaine abuse, tobacco abuse, who presented to the ED with rectal bleeding.   OT comments  Chart reviewed to date, pt greeted in bed, agreeable to OT tx session targeting improving functional activity tolerance to ADL tasks. Improvements noted in toilet transfer requiring CGA, bathing completed with CGA in sit/stand from edge of bed. Pt is making progress towards goals, will continue to benefit from acute OT to address deficits and to facilitate return to PLOF.       If plan is discharge home, recommend the following:  A little help with walking and/or transfers;A little help with bathing/dressing/bathroom;Assistance with cooking/housework;Assist for transportation;Help with stairs or ramp for entrance;Direct supervision/assist for financial management;Direct supervision/assist for medications management   Equipment Recommendations  None recommended by OT    Recommendations for Other Services      Precautions / Restrictions Precautions Precautions: Fall Restrictions Weight Bearing Restrictions: No       Mobility Bed Mobility Overal bed mobility: Modified Independent Bed Mobility: Supine to Sit Rolling: Modified independent (Device/Increase time)              Transfers Overall transfer level: Needs assistance   Transfers: Sit to/from Stand Sit to Stand: Supervision, Contact guard assist                 Balance Overall balance assessment: Needs assistance Sitting-balance support: No  upper extremity supported, Feet supported Sitting balance-Leahy Scale: Normal     Standing balance support: During functional activity, Bilateral upper extremity supported Standing balance-Leahy Scale: Good                             ADL either performed or assessed with clinical judgement   ADL Overall ADL's : Needs assistance/impaired             Lower Body Bathing: Contact guard assist;Sit to/from stand   Upper Body Dressing : Set up;Sitting   Lower Body Dressing: Contact guard assist;Sit to/from stand   Toilet Transfer: Contact guard assist;Rolling walker (2 wheels);Ambulation;Regular Toilet           Functional mobility during ADLs: Contact guard assist;Rolling walker (2 wheels) (approx 15' to bathroom)      Extremity/Trunk Assessment              Vision       Perception     Praxis      Cognition Arousal: Alert Behavior During Therapy: WFL for tasks assessed/performed Overall Cognitive Status: Within Functional Limits for tasks assessed                                 General Comments: encouragement for participation but ultimately agreeable        Exercises      Shoulder Instructions       General Comments vss throughout    Pertinent Vitals/ Pain       Pain Assessment Pain Assessment: 0-10 Pain Score: 8  Pain Location: generalized Pain Descriptors / Indicators: Aching Pain  Intervention(s): Monitored during session, Repositioned, Premedicated before session  Home Living                                          Prior Functioning/Environment              Frequency  Min 1X/week        Progress Toward Goals  OT Goals(current goals can now be found in the care plan section)  Progress towards OT goals: Progressing toward goals     Plan      Co-evaluation                 AM-PAC OT "6 Clicks" Daily Activity     Outcome Measure   Help from another person eating  meals?: None Help from another person taking care of personal grooming?: None Help from another person toileting, which includes using toliet, bedpan, or urinal?: None Help from another person bathing (including washing, rinsing, drying)?: A Little Help from another person to put on and taking off regular upper body clothing?: None Help from another person to put on and taking off regular lower body clothing?: A Little 6 Click Score: 22    End of Session Equipment Utilized During Treatment: Rolling walker (2 wheels)  OT Visit Diagnosis: Unsteadiness on feet (R26.81);Repeated falls (R29.6);Muscle weakness (generalized) (M62.81)   Activity Tolerance Patient tolerated treatment well   Patient Left Other (comment) (in care of PT, pt seated on toilet)   Nurse Communication          Time: 1308-6578 OT Time Calculation (min): 22 min  Charges: OT General Charges $OT Visit: 1 Visit OT Treatments $Self Care/Home Management : 8-22 mins Oleta Mouse, OTD OTR/L  08/22/23, 1:04 PM

## 2023-08-22 NOTE — Progress Notes (Signed)
Physical Therapy Treatment Patient Details Name: Ethan Wells MRN: [id number] DOB: [birth year] Today's Date: 08/22/2023   History of Present Illness Pt is a 52 y.o. male with significant PMH of alcohol abuse, alcoholic liver cirrhosis with portal gastropathy, s/p TIPS, superior mesenteric artery stenosis, HTN, HLD, CAD, STEMI, dCHF, GERD, depression with anxiety, adrenal insufficiency (Addison's disease), migraine headache, cardiac arrest due to V-fib, recurrent GI bleeding, cocaine abuse, tobacco abuse, who presented to the ED with rectal bleeding.    PT Comments  Pt was pleasant and motivated to participate during the session and put forth good effort throughout. Pt able to demonstrate good eccentric and concentric control and stability during sit to/from stand transfers from various height surfaces.  Pt ambulated with slow cadence and short B step length but was generally steady with no overt LOB both with a RW for the majority of his walking but also without an AD for limited ambulation in the room. Pt will benefit from continued PT services upon discharge to safely address deficits listed in patient problem list for decreased caregiver assistance and eventual return to PLOF.      If plan is discharge home, recommend the following: A little help with walking and/or transfers;Help with stairs or ramp for entrance;Direct supervision/assist for financial management;A little help with bathing/dressing/bathroom   Can travel by private vehicle        Equipment Recommendations  Other (comment) (TBD)    Recommendations for Other Services       Precautions / Restrictions Precautions Precautions: Fall Restrictions Weight Bearing Restrictions: No     Mobility  Bed Mobility               General bed mobility comments: NT    Transfers Overall transfer level: Needs assistance Equipment used: Rolling walker (2 wheels) Transfers: Sit to/from Stand Sit to Stand: Supervision            General transfer comment: Good eccentric and concentric control and stability from various height surfaces    Ambulation/Gait Ambulation/Gait assistance: Supervision Gait Distance (Feet): 180 Feet Assistive device: Rolling walker (2 wheels) Gait Pattern/deviations: Decreased step length - right, Decreased step length - left, Step-through pattern Gait velocity: decreased     General Gait Details: Slow cadence but generally steady with no overt LOB   Stairs             Wheelchair Mobility     Tilt Bed    Modified Rankin (Stroke Patients Only)       Balance Overall balance assessment: Needs assistance   Sitting balance-Leahy Scale: Normal     Standing balance support: During functional activity, Bilateral upper extremity supported Standing balance-Leahy Scale: Good                              Cognition Arousal: Alert Behavior During Therapy: WFL for tasks assessed/performed Overall Cognitive Status: Within Functional Limits for tasks assessed                                          Exercises      General Comments        Pertinent Vitals/Pain Pain Assessment Pain Assessment: 0-10 Pain Score: 8  Pain Location: Generalized body pain Pain Descriptors / Indicators: Aching, Sore Pain Intervention(s): Premedicated before session, Repositioned, Monitored during session, Patient requesting pain meds-RN  notified    Home Living                          Prior Function            PT Goals (current goals can now be found in the care plan section) Progress towards PT goals: Progressing toward goals    Frequency    Min 1X/week      PT Plan      Co-evaluation              AM-PAC PT "6 Clicks" Mobility   Outcome Measure  Help needed turning from your back to your side while in a flat bed without using bedrails?: None Help needed moving from lying on your back to sitting on the side of a flat  bed without using bedrails?: None Help needed moving to and from a bed to a chair (including a wheelchair)?: A Little Help needed standing up from a chair using your arms (e.g., wheelchair or bedside chair)?: A Little Help needed to walk in hospital room?: A Little Help needed climbing 3-5 steps with a railing? : A Little 6 Click Score: 20    End of Session   Activity Tolerance: Patient tolerated treatment well Patient left: in chair;with call bell/phone within reach Nurse Communication: Mobility status;Patient requests pain meds (Pt requesting linen change) PT Visit Diagnosis: Unsteadiness on feet (R26.81);Repeated falls (R29.6);History of falling (Z91.81);Pain Pain - part of body:  (general body pain)     Time: 1100-1119 PT Time Calculation (min) (ACUTE ONLY): 19 min  Charges:    $Gait Training: 8-22 mins PT General Charges $$ ACUTE PT VISIT: 1 Visit                     D. Scott Lincon Sahlin PT, DPT 08/22/23, 11:37 AM

## 2023-08-23 DIAGNOSIS — E2740 Unspecified adrenocortical insufficiency: Secondary | ICD-10-CM | POA: Diagnosis not present

## 2023-08-23 DIAGNOSIS — K7682 Hepatic encephalopathy: Secondary | ICD-10-CM | POA: Diagnosis not present

## 2023-08-23 DIAGNOSIS — K7030 Alcoholic cirrhosis of liver without ascites: Secondary | ICD-10-CM | POA: Diagnosis not present

## 2023-08-23 DIAGNOSIS — R601 Generalized edema: Secondary | ICD-10-CM | POA: Diagnosis not present

## 2023-08-23 LAB — BASIC METABOLIC PANEL
Anion gap: 7 (ref 5–15)
BUN: 10 mg/dL (ref 6–20)
CO2: 23 mmol/L (ref 22–32)
Calcium: 8.8 mg/dL — ABNORMAL LOW (ref 8.9–10.3)
Chloride: 109 mmol/L (ref 98–111)
Creatinine, Ser: 0.52 mg/dL — ABNORMAL LOW (ref 0.61–1.24)
GFR, Estimated: >60 mL/min (ref >60)
Glucose, Bld: 111 mg/dL — ABNORMAL HIGH (ref 70–99)
Potassium: 3.8 mmol/L (ref 3.5–5.1)
Sodium: 139 mmol/L (ref 135–145)

## 2023-08-23 LAB — CBC
HCT: 25.2 % — ABNORMAL LOW (ref 39.0–52.0)
Hemoglobin: 8 g/dL — ABNORMAL LOW (ref 13.0–17.0)
MCH: 30.3 pg (ref 26.0–34.0)
MCHC: 31.7 g/dL (ref 30.0–36.0)
MCV: 95.5 fL (ref 80.0–100.0)
Platelets: 96 10*3/uL — ABNORMAL LOW (ref 150–400)
RBC: 2.64 MIL/uL — ABNORMAL LOW (ref 4.22–5.81)
RDW: 18.2 % — ABNORMAL HIGH (ref 11.5–15.5)
WBC: 6.6 10*3/uL (ref 4.0–10.5)
nRBC: 0 % (ref 0.0–0.2)

## 2023-08-23 LAB — AMMONIA: Ammonia: 58 umol/L — ABNORMAL HIGH (ref 9–35)

## 2023-08-23 LAB — MAGNESIUM: Magnesium: 1.9 mg/dL (ref 1.7–2.4)

## 2023-08-23 MED ORDER — IPRATROPIUM-ALBUTEROL 0.5-2.5 (3) MG/3ML IN SOLN: 3.0000 mL | Freq: Four times a day (QID) | RESPIRATORY_TRACT | Status: DC | PRN

## 2023-08-23 MED ORDER — POTASSIUM CHLORIDE CRYS ER 20 MEQ PO TBCR
20.0000 meq | EXTENDED_RELEASE_TABLET | Freq: Every day | ORAL | Status: DC
  Administered 2023-08-23 – 2023-08-25 (×3): 20 meq via ORAL
  Filled 2023-08-23 (×3): qty 1

## 2023-08-23 NOTE — Progress Notes (Signed)
Progress Note   [name]:[id number] DOB: 10/28/1971 DOA: 08/08/2023     15 DOS: the patient was seen and examined on 08/23/2023   Brief hospital course:  52 y.o. male with past medical history of alcohol abuse, alcoholic liver cirrhosis with portal gastropathy, s/p TIPS, superior mesenteric artery stenosis s/p stenting, HTN, HLD, CAD, STEMI, chronic diastolic CHF, GERD, depression with anxiety, adrenal insufficiency (Addison's disease), migraine headache, cardiac arrest due to V-fib, recurrent GI bleeding, cocaine abuse, tobacco abuse  was initially admitted to ICU on 08/07/2023 with Hemorrhagic shock due to Acute GI bleed, requiring vasopressors.  Required transfusion of 2 units pRBC's.  Seen by GI and underwent EGD with findings of non-bleeding duodenal ulcers, one area treated with Purastat for concern of a healing, recently bleeding vessel. Currently on IV PPI twice daily.  TRH assumed care 08/10/2023.    Hospital course complicated by hypotension and soft BPs, acute withdrawal syndrome with acute hepatic encephalopathy.  Lactulose dose was increased on 08/16/2023.  2 units PRBCs were ordered to be transfused for hemoglobin of 6.8 on 08/18/2023.  GI was made aware and planned for repeat EGD on 08/19/2023.   Progressive anasarca requiring IV Lasix augmented with albumin for BP support.   08/23/2023: Pt laying on his side in bed, typical for him.  He reports not feeling well, would not elaborate.  When asked for specific, states he feels "blah".  He reports high urine output with diuresis, does not feel like swelling is better yet.  No other complaints.  Reports having multiple BM's daily.  Seems lethargic again today.   Assessment and Plan:  # Resolved hypovolemic/Hemorrhagic Shock BPs are stable Currently on midodrine 10 mg 3 times daily, PO Cortef, received 2 doses of IV albumin on 08/17/2023. Continue to monitor vital signs, goal MAP greater than 65.   #Acute blood loss  anemia in the setting of acute upper GI bleed #Thrombocytopenia Alcoholic Cirrhosis s/p TIPS 4/27, duodenal ulcers, portal hypertension gastropathy, seen on EGD done on 08/09/2023 and 08/19/2023. CT Angio GI Bleed  showed Active GI bleed in the region of the gastric antrum/pylorus and proximal duodenum,  Cirrhosis of liver with patent appearing TIPS. GI Dr. Mia Creek and IR oncall Dr. Fredia Sorrow both reviewed and agreed does not look like a variceal bleed by CTA.   P.o. PPI switched to IV twice daily Completed 7-day course of IV antibiotics Continue to hold off antiplatelets, Plavix, and avoid all NSAIDs. On rifaximin and lactulose due to recent hepatic encephalopathy 2 unit PRBCs ordered to be transfused for hemoglobin of 6.8 on 08/18/23 Repeat EGD done on 08/19/2023. . Anasarca -- bilateral flanks, groin, lower extremities edematous Due to liver cirrhosis, third-spacing with hypoalbuminemia. --Continue IV Lasix 20 mg BID with IV albumin beforehand to support BP --Daily weights --Strict I/O's  Bilateral flank pain in the setting of chronic pain syndrome CT scan revealed possible cluster of kidney stones involving left kidney Renal ultrasound was nonacute.   # Resolved acute hepatic encephalopathy  # Acute withdrawal syndrome ammonia of 49-> 56.  Continue lactulose and rifaximin, continue CIWA protocol with as needed Ativan.   Ammonia level has normalized on 08/17/2023.  Mentation is back to baseline.  Alcoholic cirrhosis with Portal gastropathy S/p TIPS 4/27 Continue Rifaximin and lactulose.  Chronic diastolic congestive heart failure. Continue to monitor intake and output charting Daily weights.   #Non Anion Gap Metabolic Acidosis due Lactic Acidosis #Hypokalemia #Hypophosphatemia Mild hyponatremia.  Electrolytes repleted and normalized.   #  Hx adrenal insufficiency  On Cortef.   A.m. cortisol of 1.9.  Thyroid function within normal range.   Continue midodrine dose to 10 mg 3 times daily.    Continue to closely monitor vital signs. Maintain MAP greater than 65   #Hx polysubstance abuse including EtOH -UDS+Cocaine, Last Drink on 08/06/23 --continue thiamine and folate CIWA protocol in place.  Tobacco abuse Possible COPD Nicotine patch DuoNebs every 6 hours  Physical debility PT OT with fall precautions Out of bed to chair every shift    Physical Exam: Vitals:   08/23/23 0844 08/23/23 1027 08/23/23 1240 08/23/23 1505  BP: 107/63  (!) 97/51 (!) 95/53  Pulse: 83  84 83  Resp: 14   14  Temp: 97.8 F (36.6 C)   98.5 F (36.9 C)  TempSrc:      SpO2: 99%   97%  Weight:  89.1 kg    Height:       Body mass index is 31.7 kg/m.   General exam: awake but drowsy appearing, seated edge of bed, no acute distress, obese HEENT: keeps eyes closed, moist mucus membranes, hearing grossly normal  Respiratory system: CTAB, no wheezes, rales or rhonchi, normal respiratory effort. Cardiovascular system: normal S1/S2, RRR Gastrointestinal system: soft, flank swelling / induration is further improved and softening Central nervous system: A&O x3. no gross focal neurologic deficits, normal speech Extremities: moves all, improving edema of BLE's and trunk Skin: improving indurated swelling of bilateral flanks Psychiatry: normal mood, congruent affect    Data Reviewed: Notable labs ---  Glucose 111, Cr 0.52, Ca 8.8 AMMONIA 51 (9/5) >> 58 (9/7) Hbg 8.0, Plt 96   Family Communication: none present at bedside.  Disposition: Status is: Inpatient  Remains inpatient appropriate because: Requiring further IV diuresis due to progressive anasarca    Planned Discharge Destination: Home  Time spent 40 minutes  Author: Pennie Banter, DO 08/23/2023 4:27 PM  For on call review [email or web address].

## 2023-08-23 NOTE — Plan of Care (Signed)

## 2023-08-23 NOTE — Progress Notes (Signed)
PHARMACY CONSULT NOTE - ELECTROLYTES  Pharmacy Consult for Electrolyte Monitoring and Replacement   Recent Labs: Height: 5\' 6"  (167.6 cm) Weight: 93.7 kg (206 lb 9.1 oz) IBW/kg (Calculated) : 63.8 Estimated Creatinine Clearance: 117.1 mL/min (A) (by C-G formula based on SCr of 0.52 mg/dL (L)).  Potassium (mmol/L)  Date Value  08/23/2023 3.8  09/06/2013 3.5   Magnesium (mg/dL)  Date Value  16/09/9603 1.9  03/09/2013 2.2   Calcium (mg/dL)  Date Value  54/08/8118 8.8 (L)   Calcium, Total (mg/dL)  Date Value  14/78/2956 8.9   Albumin (g/dL)  Date Value  21/30/8657 2.8 (L)  09/06/2013 4.0   Phosphorus (mg/dL)  Date Value  84/69/6295 3.6   Sodium (mmol/L)  Date Value  08/23/2023 139  09/06/2013 134 (L)    Assessment  Ethan Wells is a 52 y.o. male presenting with acute GI bleed. PMH significant for alcohol abuse, alcoholic liver cirrhosis with portal gastropathy, s/p TIPS, superior mesenteric artery stenosis s/p stenting, HTN, HLD, CAD, STEMI, dCHF, GERD, depression with anxiety, adrenal insufficiency (Addison's disease), migraine headache, cardiac arrest due to V-fib, recurrent GI bleeding, cocaine abuse, tobacco abuse . Pharmacy has been consulted to monitor and replace electrolytes.  Patient is on lasix 20 mg IV BID + albumin 12.5 g BID.   Goal of Therapy: Electrolytes WNL  Plan:  Will schedule Kcl 20 mEq daily while on IV lasix and albumin.  F/u with AM labs.   Thank you for allowing pharmacy to be a part of this patient's care.   Paschal Dopp, PharmD, BCPS 08/23/2023 8:53 AM

## 2023-08-23 NOTE — Progress Notes (Signed)
At 11:30 pm 9/6 on-Call provider was sent the following note "Pt is a 52 y.o. male with significant PMH of alcohol abuse, alcoholic liver cirrhosis with portal gastropathy presented with GI bleed. H/H 9/5 was 8.8/25.8. No signs of distress or dizziness, is experiencing hypotension as well. His blood pressure was 100/64 after we had him walk for 5 min in the hallway. Prior to this his blood pressure was 93/56 on average. He is requesting oxy but I am worried about him getting hypotensive. Please advise on his pain management.  Spoke to Manuela Schwartz NP at midnight and notified that pt just received tylenol, ativan and Robaxin. Per NP ok to give oxy 40 minutes later.

## 2023-08-24 DIAGNOSIS — K7682 Hepatic encephalopathy: Secondary | ICD-10-CM | POA: Diagnosis not present

## 2023-08-24 DIAGNOSIS — E2740 Unspecified adrenocortical insufficiency: Secondary | ICD-10-CM | POA: Diagnosis not present

## 2023-08-24 DIAGNOSIS — K7030 Alcoholic cirrhosis of liver without ascites: Secondary | ICD-10-CM | POA: Diagnosis not present

## 2023-08-24 DIAGNOSIS — R601 Generalized edema: Secondary | ICD-10-CM | POA: Diagnosis not present

## 2023-08-24 LAB — CBC
HCT: 25.3 % — ABNORMAL LOW (ref 39.0–52.0)
Hemoglobin: 8.3 g/dL — ABNORMAL LOW (ref 13.0–17.0)
MCH: 30.4 pg (ref 26.0–34.0)
MCHC: 32.8 g/dL (ref 30.0–36.0)
MCV: 92.7 fL (ref 80.0–100.0)
Platelets: 110 10*3/uL — ABNORMAL LOW (ref 150–400)
RBC: 2.73 MIL/uL — ABNORMAL LOW (ref 4.22–5.81)
RDW: 18.2 % — ABNORMAL HIGH (ref 11.5–15.5)
WBC: 6.6 10*3/uL (ref 4.0–10.5)
nRBC: 0 % (ref 0.0–0.2)

## 2023-08-24 LAB — BASIC METABOLIC PANEL
Anion gap: 8 (ref 5–15)
BUN: 10 mg/dL (ref 6–20)
CO2: 23 mmol/L (ref 22–32)
Calcium: 9 mg/dL (ref 8.9–10.3)
Chloride: 106 mmol/L (ref 98–111)
Creatinine, Ser: 0.67 mg/dL (ref 0.61–1.24)
GFR, Estimated: >60 mL/min (ref >60)
Glucose, Bld: 103 mg/dL — ABNORMAL HIGH (ref 70–99)
Potassium: 3.8 mmol/L (ref 3.5–5.1)
Sodium: 137 mmol/L (ref 135–145)

## 2023-08-24 LAB — AMMONIA: Ammonia: 43 umol/L — ABNORMAL HIGH (ref 9–35)

## 2023-08-24 MED ORDER — LORAZEPAM 0.5 MG PO TABS
0.5000 mg | ORAL_TABLET | Freq: Two times a day (BID) | ORAL | Status: DC | PRN
  Administered 2023-08-25: 0.5 mg via ORAL
  Filled 2023-08-24: qty 1

## 2023-08-24 MED ORDER — OXYCODONE-ACETAMINOPHEN 5-325 MG PO TABS
1.0000 | ORAL_TABLET | Freq: Once | ORAL | Status: AC
  Administered 2023-08-24: 1 via ORAL
  Filled 2023-08-24: qty 1

## 2023-08-24 MED ORDER — DOCUSATE SODIUM 100 MG PO CAPS: 100.0000 mg | ORAL_CAPSULE | Freq: Two times a day (BID) | ORAL | Status: DC | PRN

## 2023-08-24 MED ORDER — PANTOPRAZOLE SODIUM 40 MG PO TBEC
40.0000 mg | DELAYED_RELEASE_TABLET | Freq: Two times a day (BID) | ORAL | Status: DC
  Administered 2023-08-24 – 2023-08-25 (×3): 40 mg via ORAL
  Filled 2023-08-24 (×3): qty 1

## 2023-08-24 MED ORDER — POLYETHYLENE GLYCOL 3350 17 G PO PACK: 17.0000 g | PACK | Freq: Every day | ORAL | Status: DC | PRN

## 2023-08-24 MED ORDER — OXYCODONE HCL 5 MG PO TABS
5.0000 mg | ORAL_TABLET | Freq: Three times a day (TID) | ORAL | Status: DC | PRN
  Administered 2023-08-25: 5 mg via ORAL
  Filled 2023-08-24 (×2): qty 1

## 2023-08-24 MED ORDER — SODIUM CHLORIDE 0.9 % IV BOLUS
250.0000 mL | Freq: Once | INTRAVENOUS | Status: AC
  Administered 2023-08-24: 250 mL via INTRAVENOUS

## 2023-08-24 NOTE — Progress Notes (Addendum)
PHARMACY CONSULT NOTE - ELECTROLYTES  Pharmacy Consult for Electrolyte Monitoring and Replacement   Recent Labs: Height: 5\' 6"  (167.6 cm) Weight: 89.1 kg (196 lb 6.9 oz) IBW/kg (Calculated) : 63.8 Estimated Creatinine Clearance: 114.2 mL/min (by C-G formula based on SCr of 0.67 mg/dL).  Potassium (mmol/L)  Date Value  08/24/2023 3.8  09/06/2013 3.5   Magnesium (mg/dL)  Date Value  13/07/6577 1.9  03/09/2013 2.2   Calcium (mg/dL)  Date Value  46/96/2952 9.0   Calcium, Total (mg/dL)  Date Value  84/13/2440 8.9   Albumin (g/dL)  Date Value  10/12/2535 2.8 (L)  09/06/2013 4.0   Phosphorus (mg/dL)  Date Value  64/40/3474 3.6   Sodium (mmol/L)  Date Value  08/24/2023 137  09/06/2013 134 (L)    Assessment  Ethan Wells is a 52 y.o. male presenting with acute GI bleed. PMH significant for alcohol abuse, alcoholic liver cirrhosis with portal gastropathy, s/p TIPS, superior mesenteric artery stenosis s/p stenting, HTN, HLD, CAD, STEMI, dCHF, GERD, depression with anxiety, adrenal insufficiency (Addison's disease), migraine headache, cardiac arrest due to V-fib, recurrent GI bleeding, cocaine abuse, tobacco abuse . Pharmacy has been consulted to monitor and replace electrolytes.  Patient is on lasix 20 mg IV BID + albumin 12.5 g BID.   Goal of Therapy: Electrolytes WNL  Plan:  Will schedule Kcl 20 mEq daily while on IV lasix and albumin. Plan to hold lasix and albumin 9/8. No replacement needed.  F/u with AM labs.   Thank you for allowing pharmacy to be a part of this patient's care.   Paschal Dopp, PharmD, BCPS 08/24/2023 7:02 AM

## 2023-08-24 NOTE — Plan of Care (Signed)
  Problem: Clinical Measurements: Goal: Ability to maintain clinical measurements within normal limits will improve Outcome: Progressing   Problem: Coping: Goal: Level of anxiety will decrease Outcome: Progressing   Problem: Elimination: Goal: Will not experience complications related to bowel motility Outcome: Progressing   Problem: Education: Goal: Knowledge of General Education information will improve Description: Including pain rating scale, medication(s)/side effects and non-pharmacologic comfort measures Outcome: Not Progressing   Problem: Health Behavior/Discharge Planning: Goal: Ability to manage health-related needs will improve Outcome: Not Progressing   Problem: Pain Managment: Goal: General experience of comfort will improve Outcome: Not Progressing

## 2023-08-24 NOTE — Plan of Care (Signed)
  Problem: Education: Goal: Knowledge of General Education information will improve Description: Including pain rating scale, medication(s)/side effects and non-pharmacologic comfort measures 08/24/2023 1136 by Dillard Essex, RN Outcome: Progressing 08/24/2023 1136 by Dillard Essex, RN Outcome: Progressing   Problem: Health Behavior/Discharge Planning: Goal: Ability to manage health-related needs will improve 08/24/2023 1136 by Dillard Essex, RN Outcome: Progressing 08/24/2023 1136 by Dillard Essex, RN Outcome: Progressing   Problem: Clinical Measurements: Goal: Ability to maintain clinical measurements within normal limits will improve 08/24/2023 1136 by Dillard Essex, RN Outcome: Progressing 08/24/2023 1136 by Dillard Essex, RN Outcome: Progressing Goal: Will remain free from infection 08/24/2023 1136 by Dillard Essex, RN Outcome: Progressing 08/24/2023 1136 by Dillard Essex, RN Outcome: Progressing Goal: Diagnostic test results will improve 08/24/2023 1136 by Dillard Essex, RN Outcome: Progressing 08/24/2023 1136 by Dillard Essex, RN Outcome: Progressing Goal: Respiratory complications will improve 08/24/2023 1136 by Dillard Essex, RN Outcome: Progressing 08/24/2023 1136 by Dillard Essex, RN Outcome: Progressing Goal: Cardiovascular complication will be avoided 08/24/2023 1136 by Dillard Essex, RN Outcome: Progressing 08/24/2023 1136 by Dillard Essex, RN Outcome: Progressing   Problem: Activity: Goal: Risk for activity intolerance will decrease 08/24/2023 1136 by Dillard Essex, RN Outcome: Progressing 08/24/2023 1136 by Dillard Essex, RN Outcome: Progressing   Problem: Nutrition: Goal: Adequate nutrition will be maintained 08/24/2023 1136 by Dillard Essex, RN Outcome: Progressing 08/24/2023 1136 by Dillard Essex, RN Outcome: Progressing   Problem: Coping: Goal: Level of anxiety will decrease 08/24/2023 1136 by Dillard Essex, RN Outcome:  Progressing 08/24/2023 1136 by Dillard Essex, RN Outcome: Progressing   Problem: Elimination: Goal: Will not experience complications related to bowel motility 08/24/2023 1136 by Dillard Essex, RN Outcome: Progressing 08/24/2023 1136 by Dillard Essex, RN Outcome: Progressing Goal: Will not experience complications related to urinary retention Outcome: Progressing   Problem: Pain Managment: Goal: General experience of comfort will improve Outcome: Progressing   Problem: Safety: Goal: Ability to remain free from injury will improve Outcome: Progressing   Problem: Skin Integrity: Goal: Risk for impaired skin integrity will decrease Outcome: Progressing

## 2023-08-24 NOTE — Progress Notes (Signed)
Pt's vitals were re accessed after he walked. Pt was explained why it is unsafe to have oxy when his blood pressure is only 95/58. Pt was explained his pain plan and all the medications given to him to effectively manage his pain. RN noticed that pt was forgetful of his pain interventions. He was offered ativan. Pt was agreeable to this plan.

## 2023-08-24 NOTE — Progress Notes (Signed)
Progress Note   [name]:[id number] DOB: Jan 28, 1971 DOA: 08/08/2023     16 DOS: the patient was seen and examined on 08/24/2023   Brief hospital course:  52 y.o. male with past medical history of alcohol abuse, alcoholic liver cirrhosis with portal gastropathy, s/p TIPS, superior mesenteric artery stenosis s/p stenting, HTN, HLD, CAD, STEMI, chronic diastolic CHF, GERD, depression with anxiety, adrenal insufficiency (Addison's disease), migraine headache, cardiac arrest due to V-fib, recurrent GI bleeding, cocaine abuse, tobacco abuse  was initially admitted to ICU on 08/07/2023 with Hemorrhagic shock due to Acute GI bleed, requiring vasopressors.  Required transfusion of 2 units pRBC's.  Seen by GI and underwent EGD with findings of non-bleeding duodenal ulcers, one area treated with Purastat for concern of a healing, recently bleeding vessel. Currently on IV PPI twice daily.  TRH assumed care 08/10/2023.    Hospital course complicated by hypotension and soft BPs, acute withdrawal syndrome with acute hepatic encephalopathy.  Lactulose dose was increased on 08/16/2023.  2 units PRBCs were ordered to be transfused for hemoglobin of 6.8 on 08/18/2023.  GI was made aware and took for repeat EGD on 08/19/2023.   Progressive anasarca requiring IV Lasix augmented with albumin for BP support.  Waxing and waning mental status with intermittently recurring hepatic encephalopathy   08/24/2023: Pt laying on his side in bed, as usual.  Says "I don't feel well".  When asked in what way he states "I don't know I just don't feel well".  He declines to provide further information except to same "I'm still hurting".    Pain med was held overnight due to low BP.  Pt was reportedly upset by that.   Assessment and Plan:  # Resolved hypovolemic/Hemorrhagic Shock BPs are stable Currently on midodrine 10 mg 3 times daily, PO Cortef, received 2 doses of IV albumin on 08/17/2023. Continue to monitor vital  signs, goal MAP greater than 65.   #Acute blood loss anemia in the setting of acute upper GI bleed #Thrombocytopenia Alcoholic Cirrhosis s/p TIPS 4/27, duodenal ulcers, portal hypertension gastropathy, seen on EGD done on 08/09/2023 and 08/19/2023. CT Angio GI Bleed  showed Active GI bleed in the region of the gastric antrum/pylorus and proximal duodenum,  Cirrhosis of liver with patent appearing TIPS. GI Dr. Mia Creek and IR oncall Dr. Fredia Sorrow both reviewed and agreed does not look like a variceal bleed by CTA.   P.o. PPI switched to IV twice daily Completed 7-day course of IV antibiotics Continue to hold off antiplatelets, Plavix, and avoid all NSAIDs. On rifaximin and lactulose due to recent hepatic encephalopathy 2 unit PRBCs ordered to be transfused for hemoglobin of 6.8 on 08/18/23 Repeat EGD done on 08/19/2023. . Anasarca -- bilateral flanks, groin, lower extremities edematous Due to liver cirrhosis, third-spacing with hypoalbuminemia. --Continue IV Lasix 20 mg BID with IV albumin beforehand to support BP 9/8 - AM Lasix held for hypotension this AM --Daily weights --Strict I/O's  Bilateral flank pain in the setting of chronic pain syndrome Neuropathy ?due to long-term alcohol abuse CT scan revealed possible cluster of kidney stones involving left kidney Renal ultrasound was nonacute. --Tapering down oxycodone.   --Unable to take Tylenol (liver dz) or NSAID (GI bleed/ulcers) --Gabapentin 100 mg BID    # Hepatic encephalopathy  - intermittently recurrent # Acute withdrawal syndrome - resolved Continue lactulose and rifaximin Ammonia level has normalized on 08/17/2023.  Mentation was back to baseline. Ammonia level again elevated to 51 >> 58 with increased  lethargy (9/6-7) Improving again 9/8  Alcoholic cirrhosis with Portal gastropathy S/p TIPS 4/27 Continue Rifaximin and lactulose.  Chronic diastolic congestive heart failure. Continue to monitor intake and output charting Daily  weights.   #Non Anion Gap Metabolic Acidosis due Lactic Acidosis #Hypokalemia #Hypophosphatemia Mild hyponatremia.  Electrolytes repleted and normalized.   #Hx adrenal insufficiency  On Cortef 5 mg BID, midodrine dose to 10 mg 3 times daily.   Repeat AM cortisol level (last was 1.9 during severe acute illness) Continue to closely monitor vital signs. Maintain MAP greater than 65   #Hx polysubstance abuse including EtOH -UDS+Cocaine, Last Drink on 08/06/23 --continue thiamine and folate Monitored on CIWA - now off  Generalized anxiety - low dose PO Ativan BID PRN Needs to see PCP for long term management Suspect contributes to substance abuse  Tobacco abuse Possible COPD - not exacerbated Nicotine patch DuoNebs PRN  Physical debility PT OT with fall precautions Out of bed to chair every shift    Physical Exam: Vitals:   08/24/23 0301 08/24/23 0757 08/24/23 0759 08/24/23 1516  BP: (!) 95/57 (!) 73/29 (!) 93/48 (!) 117/57  Pulse: 88 75 77 74  Resp:  14  14  Temp:  98.1 F (36.7 C)  98.1 F (36.7 C)  TempSrc:      SpO2:  98% 98% 100%  Weight:      Height:       Body mass index is 31.7 kg/m.   General exam: awake but drowsy, seated edge of bed, no acute distress, obese HEENT: makes no eye contact, moist mucus membranes, hearing grossly normal  Respiratory system: on room air, normal respiratory effort. Cardiovascular system: normal S1/S2, RRR Gastrointestinal system: soft, flank swelling / induration is further improved and softening Central nervous system: A&O x3. no gross focal neurologic deficits, normal speech Extremities: moves all, further improved edema of BLE's and trunk Skin: improving indurated swelling of bilateral flanks Psychiatry: normal mood, congruent affect, withdrawn & minimally interactive so unclear judgment and insight    Data Reviewed: Notable labs ---  Glucose 103,  AMMONIA 51 (9/5) >> 58 (9/7) >> 43 (9/8) Hbg 8.0 >> 8.3, Plt 96 >>  110   Family Communication: updated patient's mother by phone this afternoon 9/8. She states pt lives "on the property" where she has a room in her niece's house. She states pt may return there when ready "as long as he can care for himself".   Disposition: Status is: Inpatient  Remains inpatient appropriate because: Requiring further IV diuresis due to progressive anasarca. Recurrent hepatic encephalopathy improving again.  Expect discharge in 1-2 days (9/9 or 9/10)    Planned Discharge Destination: Home  Time spent 36 minutes  Author: Pennie Banter, DO 08/24/2023 3:36 PM  For on call review [email or web address].

## 2023-08-24 NOTE — Progress Notes (Signed)
Patient requested for oxy. He was notified by the nurse that his blood pressure has been really low in 90/50 which can be unsafe to administer oxy. RN encouraged patient to walk and stay active. RN  will re access once patient has walked.

## 2023-08-25 ENCOUNTER — Other Ambulatory Visit: Payer: Self-pay

## 2023-08-25 DIAGNOSIS — E2740 Unspecified adrenocortical insufficiency: Secondary | ICD-10-CM | POA: Diagnosis not present

## 2023-08-25 DIAGNOSIS — K7030 Alcoholic cirrhosis of liver without ascites: Secondary | ICD-10-CM | POA: Diagnosis not present

## 2023-08-25 DIAGNOSIS — K7682 Hepatic encephalopathy: Secondary | ICD-10-CM | POA: Diagnosis not present

## 2023-08-25 DIAGNOSIS — D62 Acute posthemorrhagic anemia: Secondary | ICD-10-CM | POA: Diagnosis not present

## 2023-08-25 LAB — BASIC METABOLIC PANEL
Anion gap: 9 (ref 5–15)
BUN: 11 mg/dL (ref 6–20)
CO2: 21 mmol/L — ABNORMAL LOW (ref 22–32)
Calcium: 9.2 mg/dL (ref 8.9–10.3)
Chloride: 110 mmol/L (ref 98–111)
Creatinine, Ser: 0.52 mg/dL — ABNORMAL LOW (ref 0.61–1.24)
GFR, Estimated: >60 mL/min (ref >60)
Glucose, Bld: 155 mg/dL — ABNORMAL HIGH (ref 70–99)
Potassium: 4.1 mmol/L (ref 3.5–5.1)
Sodium: 140 mmol/L (ref 135–145)

## 2023-08-25 LAB — CBC
HCT: 27.9 % — ABNORMAL LOW (ref 39.0–52.0)
Hemoglobin: 8.9 g/dL — ABNORMAL LOW (ref 13.0–17.0)
MCH: 29.5 pg (ref 26.0–34.0)
MCHC: 31.9 g/dL (ref 30.0–36.0)
MCV: 92.4 fL (ref 80.0–100.0)
Platelets: 113 10*3/uL — ABNORMAL LOW (ref 150–400)
RBC: 3.02 MIL/uL — ABNORMAL LOW (ref 4.22–5.81)
RDW: 17.7 % — ABNORMAL HIGH (ref 11.5–15.5)
WBC: 7 10*3/uL (ref 4.0–10.5)
nRBC: 0 % (ref 0.0–0.2)

## 2023-08-25 LAB — MAGNESIUM: Magnesium: 1.9 mg/dL (ref 1.7–2.4)

## 2023-08-25 LAB — CORTISOL-AM, BLOOD: Cortisol - AM: 4.4 ug/dL — ABNORMAL LOW (ref 6.7–22.6)

## 2023-08-25 MED ORDER — DOCUSATE SODIUM 100 MG PO CAPS: 100.0000 mg | ORAL_CAPSULE | Freq: Two times a day (BID) | ORAL | Status: DC | PRN

## 2023-08-25 MED ORDER — FUROSEMIDE 40 MG PO TABS
40.0000 mg | ORAL_TABLET | Freq: Every day | ORAL | 1 refills | Status: DC
Start: 2023-08-25 — End: 2023-09-08
  Filled 2023-08-25: qty 30, 30d supply, fill #0

## 2023-08-25 MED ORDER — OXYCODONE HCL 5 MG PO TABS
5.0000 mg | ORAL_TABLET | Freq: Three times a day (TID) | ORAL | 0 refills | Status: DC | PRN
Start: 2023-08-25 — End: 2023-09-08
  Filled 2023-08-25: qty 15, 5d supply, fill #0

## 2023-08-25 MED ORDER — LACTULOSE 10 GM/15ML PO SOLN
30.0000 g | Freq: Three times a day (TID) | ORAL | 2 refills | Status: DC
  Filled 2023-08-25: qty 473, 4d supply, fill #0

## 2023-08-25 MED ORDER — MIDODRINE HCL 10 MG PO TABS
10.0000 mg | ORAL_TABLET | Freq: Three times a day (TID) | ORAL | 2 refills | Status: DC
  Filled 2023-08-25: qty 90, 30d supply, fill #0

## 2023-08-25 MED ORDER — ENSURE ENLIVE PO LIQD: 237.0000 mL | Freq: Two times a day (BID) | ORAL | Status: AC

## 2023-08-25 MED ORDER — POLYETHYLENE GLYCOL 3350 17 GM/SCOOP PO POWD
17.0000 g | Freq: Every day | ORAL | 0 refills | Status: AC | PRN
Start: 2023-08-25 — End: ?
  Filled 2023-08-25: qty 238, 14d supply, fill #0

## 2023-08-25 MED ORDER — HYDROCORTISONE 5 MG PO TABS
5.0000 mg | ORAL_TABLET | Freq: Two times a day (BID) | ORAL | 2 refills | Status: DC
  Filled 2023-08-25: qty 60, 30d supply, fill #0

## 2023-08-25 MED ORDER — POTASSIUM CHLORIDE CRYS ER 20 MEQ PO TBCR
20.0000 meq | EXTENDED_RELEASE_TABLET | Freq: Every day | ORAL | 0 refills | Status: DC
  Filled 2023-08-25: qty 30, 30d supply, fill #0

## 2023-08-25 NOTE — Plan of Care (Signed)

## 2023-08-25 NOTE — Anesthesia Postprocedure Evaluation (Signed)
Anesthesia Post Note  [name]  Procedure(s) Performed: ESOPHAGOGASTRODUODENOSCOPY (EGD) WITH PROPOFOL  Patient location during evaluation: PACU Anesthesia Type: General Level of consciousness: awake and alert Pain management: pain level controlled Vital Signs Assessment: post-procedure vital signs reviewed and stable Respiratory status: spontaneous breathing, nonlabored ventilation, respiratory function stable and patient connected to nasal cannula oxygen Cardiovascular status: blood pressure returned to baseline and stable Postop Assessment: no apparent nausea or vomiting Anesthetic complications: no   No notable events documented.   Last Vitals:  Vitals:   08/24/23 1516 08/24/23 2311  BP: (!) 117/57 (!) 97/53  Pulse: 74 76  Resp: 14 17  Temp: 36.7 C 36.9 C  SpO2: 100% 98%    Last Pain:  Vitals:   08/25/23 0744  TempSrc:   PainSc: Asleep                 Yevette Edwards

## 2023-08-25 NOTE — Group Note (Deleted)

## 2023-08-25 NOTE — TOC Progression Note (Signed)
Transition of Care [facility]) - Progression Note    Patient Details  Name: GEN WOLFINGER MRN: [id number] Date of Birth: 06-28-1971  [facility] CM/SW Contact  Marlowe Sax, RN Phone Number: 08/25/2023, 12:00 PM  Clinical Narrative:    Patient signed the taxi voucher I faxed it to [facility], The bedside nurse will call Cheyenne Adas when the patient is ready to be picked up and he will provide the Taxi service the VOucher   Expected Discharge Plan: Home/Self Care Barriers to Discharge: Continued Medical Work up  Expected Discharge Plan and Services   Discharge Planning Services: CM Consult Post Acute Care Choice: NA Living arrangements for the past 2 months: Homeless Expected Discharge Date: 08/25/23               DME Arranged: N/A                     Social Determinants of Health (SDOH) Interventions SDOH Screenings   Food Insecurity: No Food Insecurity (08/09/2023)  Housing: Patient Unable To Answer (08/09/2023)  Transportation Needs: No Transportation Needs (08/09/2023)  Utilities: Not At Risk (08/09/2023)  Alcohol Screen: Medium Risk (10/01/2022)  Tobacco Use: High Risk (08/19/2023)    Readmission Risk Interventions    08/11/2023   10:35 AM 02/24/2023    3:12 PM 01/17/2023    3:38 PM  Readmission Risk Prevention Plan  Transportation Screening Complete Complete Complete  Medication Review (RN Care Manager) Complete Complete Complete  PCP or Specialist appointment within 3-5 days of discharge Complete    SW Recovery Care/Counseling Consult Complete  Not Complete  SW Consult Not Complete Comments   lethargic  Palliative Care Screening Not Applicable Not Applicable Not Applicable  Skilled Nursing Facility Not Applicable Not Applicable Not Applicable

## 2023-08-25 NOTE — Care Management Important Message (Signed)
Important Message  Patient Details  Name: Ethan Wells MRN: [id number] Date of Birth: [birth year]   Medicare Important Message Given:  Yes     [name] A Kathaleen Dudziak 08/25/2023, 11:50 AM

## 2023-08-25 NOTE — Progress Notes (Signed)
Mobility Specialist - Progress Note   08/25/23 0941  Mobility  Activity Ambulated independently in hallway;Stood at bedside;Dangled on edge of bed  Level of Assistance Independent  Assistive Device Front wheel walker  Distance Ambulated (ft) 320 ft  Activity Response Tolerated well  Mobility Referral Yes  $Mobility charge 1 Mobility  Mobility Specialist Start Time (ACUTE ONLY) 0911  Mobility Specialist Stop Time (ACUTE ONLY) 0925  Mobility Specialist Time Calculation (min) (ACUTE ONLY) 14 min   Pt supine in bed on RA upon arrival. Pt STS and ambulates 2 laps around NS indep. Pt returns to bed with needs in reach.   Ethan Wells  Mobility Specialist  08/25/23 9:42 AM

## 2023-08-25 NOTE — Consult Note (Signed)
   [facility] CM Inpatient Consult   08/25/2023  SEQUAN KUO 1971/02/06 [id number]  PCP: Dr. Kenard Gower,  [facility])  Update:  -Pt remains inpt currently at 16 days LOS. -Undergoing EGD with propofol today.  Plans: Note via 08/22/23 TOC team indicates pt will returned to her mother's driveway where is resides in his TRUCK awaiting housing on the waiting list. Pt receives SS# with no additional updates at this time.   Will note any changes to the pt's discharge disposition.  Elliot Cousin, RN, [facility] Liaison [place]   Population Health Office Hours MTWF  8:00 am-6:00 pm [phone] mobile [phone] [Office toll free line] Office Hours are M-F 8:30 - 5 pm [email or web address]

## 2023-08-25 NOTE — Discharge Summary (Addendum)
Physician Discharge Summary   [name] MRN: [id number] DOB: 1971-12-14  Admit date:     08/08/2023  Discharge date: 08/25/2023  Discharge Physician: Pennie Banter   PCP: [name], NP   Recommendations at discharge:    Follow up with Primary Care within 1-2 weeks Follow up with Gastroenterology as scheduled Repeat CMP, Ammonia, CBC, Mg in 1-2 weeks Adjust Lactulose dosing as needed for prevention of hepatic encephalopathy  Discharge Diagnoses: Principal Problem:   GI bleeding Active Problems:   Hemorrhagic shock (HCC)   Alcoholic cirrhosis (HCC)   Esophageal reflux   Anasarca   Adrenal insufficiency (HCC)   CAD (coronary artery disease)   HLD (hyperlipidemia)   Hypokalemia   Cocaine dependence with cocaine-induced disorder (HCC)   Depression with anxiety   Ischemic cardiomyopathy   Alcohol dependence (HCC)   Hypophosphatemia   Hepatic encephalopathy (HCC)   Acute blood loss anemia (ABLA)   Acute metabolic encephalopathy  Resolved Problems:   * No resolved hospital problems. *  Hospital Course:  52 y.o. male with past medical history of alcohol abuse, alcoholic liver cirrhosis with portal gastropathy, s/p TIPS, superior mesenteric artery stenosis s/p stenting, HTN, HLD, CAD, STEMI, chronic diastolic CHF, GERD, depression with anxiety, adrenal insufficiency (Addison's disease), migraine headache, cardiac arrest due to V-fib, recurrent GI bleeding, cocaine abuse, tobacco abuse  was initially admitted to ICU on 08/07/2023 with Hemorrhagic shock due to Acute GI bleed, requiring vasopressors.  Required transfusion of 2 units pRBC's.  Seen by GI and underwent EGD with findings of non-bleeding duodenal ulcers, one area treated with Purastat for concern of a healing, recently bleeding vessel. Currently on IV PPI twice daily.   TRH assumed care 08/10/2023.     Hospital course complicated by hypotension and soft BPs, acute withdrawal syndrome with  acute hepatic encephalopathy.  Lactulose dose was increased on 08/16/2023.  2 units PRBCs were ordered to be transfused for hemoglobin of 6.8 on 08/18/2023.  GI was made aware and took for repeat EGD on 08/19/2023.   Progressive anasarca requiring IV Lasix augmented with albumin for BP support.  Waxing and waning mental status with intermittently recurring hepatic encephalopathy    08/25/23 --- pt seen this AM, awake resting in bed.  No acute complaints this AM.  He is not lethargic today, more awake alert and interactive.   Pt is medically stable for discharge home today and is agreeable.   I spoke with his mother yesterday who indicated he could return to the property where he was staying before admission.   Assessment and Plan:  # Resolved hypovolemic/Hemorrhagic Shock BPs are stable Currently on midodrine 10 mg 3 times daily, PO Cortef, received 2 doses of IV albumin on 08/17/2023. Continue to monitor vital signs, goal MAP greater than 65.   #Acute blood loss anemia in the setting of acute upper GI bleed #Thrombocytopenia Alcoholic Cirrhosis s/p TIPS 4/27, duodenal ulcers, portal hypertension gastropathy, seen on EGD done on 08/09/2023 and 08/19/2023. CT Angio GI Bleed  showed Active GI bleed in the region of the gastric antrum/pylorus and proximal duodenum,  Cirrhosis of liver with patent appearing TIPS. GI Dr. Mia Creek and IR oncall Dr. Fredia Sorrow both reviewed and agreed does not look like a variceal bleed by CTA.   P.o. PPI switched to IV twice daily Completed 7-day course of IV antibiotics Continue to hold off antiplatelets, Plavix, and avoid all NSAIDs. On rifaximin and lactulose due to recent hepatic encephalopathy 2 unit PRBCs ordered  to be transfused for hemoglobin of 6.8 on 08/18/23 Repeat EGD done on 08/19/2023. . Anasarca -- bilateral flanks, groin, lower extremities edematous -- improved with diuresis.   Due to liver cirrhosis, third-spacing with hypoalbuminemia. --Treated with IV Lasix 20  mg BID with IV albumin beforehand to support BP --Daily weights at home --Close PCP follow up for repeat labs & optimize diuretic regimen as BP tolerates   Bilateral flank pain in the setting of chronic pain syndrome Neuropathy ?due to long-term alcohol abuse CT scan revealed possible cluster of kidney stones involving left kidney Renal ultrasound was nonacute. --Tapering down oxycodone.   --Unable to take Tylenol (liver dz) or NSAID (GI bleed/ulcers) --Gabapentin 100 mg BID     # Hepatic encephalopathy  - intermittently recurrent # Acute withdrawal syndrome - resolved Continue lactulose  Ammonia level has normalized on 08/17/2023.  Mentation was back to baseline. Ammonia level again elevated to 51 >> 58 with increased lethargy (9/6-7) Improving again 9/8 >> 9/9   Alcoholic cirrhosis with Portal gastropathy S/p TIPS 4/27 Continue lactulose at discharge Also treated wit Rifaximin during admission, per GI Unable to d/c on Rifaximin due to cost. Follow up with GI outpatient   Chronic diastolic congestive heart failure. Continue to monitor intake and output charting Daily weights.    #Non Anion Gap Metabolic Acidosis due Lactic Acidosis #Hypokalemia #Hypophosphatemia Mild hyponatremia.  Electrolytes repleted and normalized.   #Hx adrenal insufficiency  Continue Cortef 5 mg BID Continue midodrine dose to 10 mg 3 times daily.   --BP's intermittently low but pt asymptomatic --Monitor closely in follow up --Pt advised to monitor BP's at home twice daily or if dizzy/lightheaded   #Hx polysubstance abuse including EtOH # Alcohol dependence and abuse - ruled in -UDS+Cocaine, Last Drink on 08/06/23 --continue thiamine and folate Monitored on CIWA - now off and asymptomatic   Generalized anxiety - low dose PO Ativan BID PRN Follow up with Psychiatry and/or PCP for long term management Suspect this contributes to substance abuse   Tobacco abuse Possible COPD - not  exacerbated Nicotine patch Bronchodilators PRN   Physical debility PT OT with fall precautions Out of bed to chair every shift           Consultants: GI, PCCM Procedures performed: As above  Disposition: Home  Diet recommendation:  Low sodium   DISCHARGE MEDICATION: Allergies as of 08/25/2023       Reactions   Penicillins Hives, Rash   Tolerated Ceftriaxone 10/2022        Medication List     STOP taking these medications    clopidogrel 75 MG tablet Commonly known as: PLAVIX   LORazepam 0.5 MG tablet Commonly known as: ATIVAN   nitroGLYCERIN 0.4 MG SL tablet Commonly known as: NITROSTAT   ondansetron 4 MG tablet Commonly known as: Zofran   rifaximin 550 MG Tabs tablet Commonly known as: XIFAXAN       TAKE these medications    docusate sodium 100 MG capsule Commonly known as: COLACE Take 1 capsule (100 mg total) by mouth 2 (two) times daily as needed (if less than 3 BM"s in past 24 hours).   feeding supplement Liqd Take 237 mLs by mouth 2 (two) times daily between meals.   folic acid 1 MG tablet Commonly known as: FOLVITE Take 1 tablet (1 mg total) by mouth daily.   furosemide 40 MG tablet Commonly known as: Lasix Take 1 tablet (40 mg total) by mouth daily.   gabapentin 100 MG  capsule Commonly known as: NEURONTIN Take 1 capsule (100 mg total) by mouth 2 (two) times daily.   hydrocortisone 5 MG tablet Commonly known as: CORTEF Take 1 tablet (5 mg total) by mouth 2 (two) times daily.   lactulose 10 GM/15ML solution Commonly known as: CHRONULAC Take 45 mLs (30 g total) by mouth 3 (three) times daily.   methocarbamol 750 MG tablet Commonly known as: ROBAXIN TAKE 1 TABLET (750 MG TOTAL) BY MOUTH 2 (TWO) TIMES DAILY AS NEEDED FOR MUSCLE SPASMS.   midodrine 10 MG tablet Commonly known as: PROAMATINE Take 1 tablet (10 mg total) by mouth 3 (three) times daily with meals.   multivitamin with minerals Tabs tablet Take 1 tablet by mouth  daily.   nicotine 21 mg/24hr patch Commonly known as: NICODERM CQ - dosed in mg/24 hours One 21 mg patch chest wall daily (okay to substitute generic)   oxyCODONE 5 MG immediate release tablet Commonly known as: Oxy IR/ROXICODONE Take 1 tablet (5 mg total) by mouth every 8 (eight) hours as needed for severe pain. What changed:  when to take this reasons to take this   pantoprazole 40 MG tablet Commonly known as: PROTONIX Take 1 tablet (40 mg total) by mouth 2 (two) times daily.   polyethylene glycol powder 17 GM/SCOOP powder Commonly known as: GLYCOLAX/MIRALAX Take 17 g by mouth daily as needed (if less than 3 BM"s in past 24 hours).   potassium chloride SA 20 MEQ tablet Commonly known as: KLOR-CON M Take 1 tablet (20 mEq total) by mouth daily. Start taking on: August 26, 2023   QUEtiapine 50 MG tablet Commonly known as: SEROQUEL Take 1 tablet (50 mg total) by mouth at bedtime.   rosuvastatin 40 MG tablet Commonly known as: CRESTOR TAKE 1 TABLET BY MOUTH EVERY DAY   thiamine 100 MG tablet Commonly known as: VITAMIN B1 Take 1 tablet (100 mg total) by mouth daily.        Discharge Exam: Filed Weights   08/21/23 0927 08/23/23 1027 08/25/23 0720  Weight: 93.7 kg 89.1 kg 89.8 kg   General exam: awake, alert, no acute distress Respiratory system: CTAB, no wheezes, rales or rhonchi, normal respiratory effort. Cardiovascular system: normal S1/S2, RRR, improved pedal edema.   Gastrointestinal system: soft, NT, ND Central nervous system: A&O x 3. no gross focal neurologic deficits, normal speech Extremities: moves all, improved BLE edema Skin: dry, intact, normal temperature Psychiatry: normal mood, flat affect, judgement and insight appear normal   Condition at discharge: stable  The results of significant diagnostics from this hospitalization (including imaging, microbiology, ancillary and laboratory) are listed below for reference.   Imaging Studies: US  RENAL  Result Date: 08-26-23 CLINICAL DATA:  Left flank pain EXAM: RENAL / URINARY TRACT ULTRASOUND COMPLETE COMPARISON:  08/09/2023 FINDINGS: Right Kidney: Renal measurements: 10.6 x 4.6 x 4.9 cm = volume: 123.8 mL. Echogenicity within normal limits. No mass or hydronephrosis visualized. Left Kidney: Renal measurements: 9.8 x 4.1 x 4.5 cm = volume: 93.5 mL. Echogenicity within normal limits. No mass or hydronephrosis visualized. Bladder: Bladder is decompressed, limiting its evaluation. Stable nonspecific wall thickening. Other: None. IMPRESSION: 1. Unremarkable appearance of the bilateral kidneys. 2. Nonspecific bladder wall thickening given decompressed state, unchanged since prior CT. Electronically Signed   By: Sharlet Salina M.D.   On: 08/26/23 17:58   DG Chest Port 1 View  Result Date: 08/12/2023 CLINICAL DATA:  Left chest pain, dyspnea EXAM: PORTABLE CHEST 1 VIEW COMPARISON:  08/09/2023 FINDINGS:  Right IJ central line tip: Lower SVC. The patient is rotated to the right on today's radiograph, reducing diagnostic sensitivity and specificity. Borderline enlargement of the cardiopericardial silhouette. Mild indistinctness of pulmonary vasculature suggesting pulmonary venous hypertension. No overt edema. No blunting of the costophrenic angles. No airspace opacity identified. IMPRESSION: 1. Borderline enlargement of the cardiopericardial silhouette with mild indistinctness of pulmonary vasculature suggesting pulmonary venous hypertension. No overt edema. 2. Right IJ central line tip: Lower SVC. Electronically Signed   By: Gaylyn Rong M.D.   On: 08/12/2023 13:18   DG Chest 1 View  Result Date: 08/09/2023 CLINICAL DATA:  Status post central line placement EXAM: PORTABLE CHEST 1 VIEW COMPARISON:  08/08/2023 FINDINGS: Cardiac shadow is stable. Right jugular central line is noted with the catheter tip in the superior right atrium. Lungs are clear. No pneumothorax is noted. No bony abnormality is  noted. IMPRESSION: Status post central line placement in satisfactory position. No complicating factors are noted. Electronically Signed   By: Alcide Clever M.D.   On: 08/09/2023 03:52   CT ANGIO GI BLEED  Result Date: 08/09/2023 CLINICAL DATA:  GI bleed. EXAM: CTA ABDOMEN AND PELVIS WITHOUT AND WITH CONTRAST TECHNIQUE: Multidetector CT imaging of the abdomen and pelvis was performed using the standard protocol during bolus administration of intravenous contrast. Multiplanar reconstructed images and MIPs were obtained and reviewed to evaluate the vascular anatomy. RADIATION DOSE REDUCTION: This exam was performed according to the departmental dose-optimization program which includes automated exposure control, adjustment of the mA and/or kV according to patient size and/or use of iterative reconstruction technique. CONTRAST:  OMNIPAQUE IOHEXOL 350 MG/ML SOLN COMPARISON:  Abdominal CT and MRI dated 06/30/2023. FINDINGS: VASCULAR Aorta: Moderate calcified and noncalcified plaque. No aneurysmal dilatation or dissection. No periaortic fluid collection. Celiac: Stent in the origin of the celiac trunk the celiac artery and its major branches appear patent. SMA: Stent in the origin of the SMA.  The SMA appears patent. Renals: The renal arteries are patent. IMA: The IMA is patent. Inflow: Mild atherosclerotic calcification of the iliac arteries. No aneurysmal dilatation or dissection. The iliac arteries are patent. Proximal Outflow: The visualized proximal outflow is patent. Veins: The IVC is unremarkable. A TIPS is in place and appears patent. The SMV, splenic vein the main portal vein are patent. No portal venous gas. Review of the MIP images confirms the above findings. NON-VASCULAR Lower chest: The visualized lung bases are clear. No intra-abdominal free air or free fluid. Hepatobiliary: Cirrhosis. Similar appearance of partially exophytic lesion from the medial segment V as seen on the prior CT and MRI.  Follow-up as per recommendation of the brown. No biliary dilatation. The gallbladder is contracted. Pancreas: Unremarkable. No pancreatic ductal dilatation or surrounding inflammatory changes. Spleen: Normal in size without focal abnormality. Adrenals/Urinary Tract: The adrenal glands are unremarkable. There is no hydronephrosis on either side. Faint 3 mm high attenuating focus in the inferior pole of the left kidney may represent a stone or a cluster of tiny stones. There is symmetric enhancement of the kidneys bilaterally. The visualized ureters appear unremarkable. The urinary bladder is minimally distended. There is thickened appearance of the bladder wall, possibly related to underdistention. Stomach/Bowel: Extravasated IV contrast noted in the region of the gastric antrum/pylorus and in the proximal duodenum consistent with active GI bleed. There is no bowel obstruction or active inflammation. The appendix is normal. Lymphatic: No adenopathy. Reproductive: The prostate and seminal vesicles are grossly remarkable. No free gas. Other: None Musculoskeletal:  No acute osseous pathology. IMPRESSION: 1. Active GI bleed in the region of the gastric antrum/pylorus and proximal duodenum. 2. Cirrhosis with patent appearing TIPS. 3. Other non-acute findings as above. 4.  Aortic Atherosclerosis (ICD10-I70.0). Electronically Signed   By: Elgie Collard M.D.   On: 08/09/2023 01:43   DG Chest 2 View  Result Date: 08/08/2023 CLINICAL DATA:  Left leg swelling for 1 week. EXAM: CHEST - 2 VIEW COMPARISON:  Chest radiographs 05/30/2023 and 05/09/2023 FINDINGS: Cardiac silhouette is again mildly enlarged. Mediastinal contours are within normal limits. The lungs are clear. No pleural effusion pneumothorax. Mild multilevel degenerative disc changes of the thoracic spine. Superior right upper quadrant surgical clip is again seen along with faint tips, better seen on prior 05/09/2023 CT. IMPRESSION: 1. No active cardiopulmonary  disease. 2. Mild cardiomegaly. Electronically Signed   By: Neita Garnet M.D.   On: 08/08/2023 17:03    Microbiology: Results for orders placed or performed during the hospital encounter of 08/08/23  SARS Coronavirus 2 by RT PCR (hospital order, performed in [facility] hospital lab) *cepheid single result test* Anterior Nasal Swab     Status: None   Collection Time: 08/08/23  4:40 PM   Specimen: Anterior Nasal Swab  Result Value Ref Range Status   SARS Coronavirus 2 by RT PCR NEGATIVE NEGATIVE Final    Comment: (NOTE) SARS-CoV-2 target nucleic acids are NOT DETECTED.  The SARS-CoV-2 RNA is generally detectable in upper and lower respiratory specimens during the acute phase of infection. The lowest concentration of SARS-CoV-2 viral copies this assay can detect is 250 copies / mL. A negative result does not preclude SARS-CoV-2 infection and should not be used as the sole basis for treatment or other patient management decisions.  A negative result may occur with improper specimen collection / handling, submission of specimen other than nasopharyngeal swab, presence of viral mutation(s) within the areas targeted by this assay, and inadequate number of viral copies (<250 copies / mL). A negative result must be combined with clinical observations, patient history, and epidemiological information.  Fact Sheet for Patients:   RoadLapTop.co.za  Fact Sheet for Healthcare Providers: http://kim-miller.com/  This test is not yet approved or  cleared by the [place] FDA and has been authorized for detection and/or diagnosis of SARS-CoV-2 by FDA under an Emergency Use Authorization (EUA).  This EUA will remain in effect (meaning this test can be used) for the duration of the COVID-19 declaration under Section 564(b)(1) of the Act, 21 U.S.C. section 360bbb-3(b)(1), unless the authorization is terminated or revoked sooner.  Performed at [facility], [address] [place], [place] [address]   MRSA Next Gen by PCR, Nasal     Status: None   Collection Time: 08/09/23  8:21 AM   Specimen: Nasal Mucosa; Nasal Swab  Result Value Ref Range Status   MRSA by PCR Next Gen NOT DETECTED NOT DETECTED Final    Comment: (NOTE) The GeneXpert MRSA Assay (FDA approved for NASAL specimens only), is one component of a comprehensive MRSA colonization surveillance program. It is not intended to diagnose MRSA infection nor to guide or monitor treatment for MRSA infections. Test performance is not FDA approved in patients less than 25 years old. Performed at [facility], [address] [place], [place] [address]   MRSA Next Gen by PCR, Nasal     Status: None   Collection Time: 08/12/23 11:12 AM   Specimen: Nasal Mucosa; Nasal Swab  Result Value Ref  Range Status   MRSA by PCR Next Gen NOT DETECTED NOT DETECTED Final    Comment: (NOTE) The GeneXpert MRSA Assay (FDA approved for NASAL specimens only), is one component of a comprehensive MRSA colonization surveillance program. It is not intended to diagnose MRSA infection nor to guide or monitor treatment for MRSA infections. Test performance is not FDA approved in patients less than 54 years old. Performed at [facility], [address] [place], [place] [address]     Labs: CBC: Recent Labs  Lab 08/20/23 0435 08/21/23 0402 08/23/23 0618 08/24/23 0507 08/25/23 0516  WBC 5.7 7.8 6.6 6.6 7.0  HGB 7.9* 8.4* 8.0* 8.3* 8.9*  HCT 24.4* 25.8* 25.2* 25.3* 27.9*  MCV 93.5 91.5 95.5 92.7 92.4  PLT 77* 103* 96* 110* 113*   Basic Metabolic Panel: Recent Labs  Lab 08/19/23 0342 08/20/23 0435 08/21/23 0402 08/22/23 0509 08/23/23 0618 08/24/23 0507 08/25/23 0516  NA 141  --  139 140 139 137 140  K 3.7  --  4.0 3.7 3.8 3.8 4.1  CL 111  --  109 107 109 106 110  CO2 24  --  22 24 23 23  21*  GLUCOSE 90  --  123* 115* 111* 103* 155*  BUN 14  --  8 7 10 10 11    CREATININE 0.61  --  0.56* 0.63 0.52* 0.67 0.52*  CALCIUM 8.2*  --  8.6* 8.8* 8.8* 9.0 9.2  MG 2.0 2.0 1.9  --  1.9  --  1.9  PHOS 4.1 3.7 3.6  --   --   --   --    Liver Function Tests: Recent Labs  Lab 08/21/23 0402  AST 41  ALT 32  ALKPHOS 122  BILITOT 2.5*  PROT 5.6*  ALBUMIN 2.8*   CBG: No results for input(s): "GLUCAP" in the last 168 hours.  Discharge time spent: greater than 30 minutes.  Signed: Pennie Banter, DO [facility] 08/25/2023

## 2023-08-25 NOTE — Progress Notes (Signed)
PHARMACY CONSULT NOTE - ELECTROLYTES  Pharmacy Consult for Electrolyte Monitoring and Replacement   Recent Labs: Height: 5\' 6"  (167.6 cm) Weight: 89.8 kg (197 lb 15.6 oz) IBW/kg (Calculated) : 63.8 Estimated Creatinine Clearance: 114.6 mL/min (A) (by C-G formula based on SCr of 0.52 mg/dL (L)).  Potassium (mmol/L)  Date Value  08/25/2023 4.1  09/06/2013 3.5   Magnesium (mg/dL)  Date Value  16/09/9603 1.9  03/09/2013 2.2   Calcium (mg/dL)  Date Value  54/08/8118 9.2   Calcium, Total (mg/dL)  Date Value  14/78/2956 8.9   Albumin (g/dL)  Date Value  21/30/8657 2.8 (L)  09/06/2013 4.0   Phosphorus (mg/dL)  Date Value  84/69/6295 3.6   Sodium (mmol/L)  Date Value  08/25/2023 140  09/06/2013 134 (L)    Assessment  Ethan Wells is a 52 y.o. male presenting with acute GI bleed. PMH significant for alcohol abuse, alcoholic liver cirrhosis with portal gastropathy, s/p TIPS, superior mesenteric artery stenosis s/p stenting, HTN, HLD, CAD, STEMI, dCHF, GERD, depression with anxiety, adrenal insufficiency (Addison's disease), migraine headache, cardiac arrest due to V-fib, recurrent GI bleeding, cocaine abuse, tobacco abuse . Pharmacy has been consulted to monitor and replace electrolytes.  Patient is on lasix 20 mg IV BID    Goal of Therapy: Electrolytes WNL  Plan:  No replacement needed.  F/u with AM labs.   Thank you for allowing pharmacy to be a part of this patient's care.   Erasmo Leventhal, PharmD Candidate 2025 08/25/2023 10:01 AM

## 2023-08-27 ENCOUNTER — Telehealth: Payer: Self-pay

## 2023-08-27 NOTE — Transitions of Care (Post Inpatient/ED Visit) (Signed)
08/27/2023  Name: Ethan Wells MRN: [id number] DOB: [birth year]  Today's TOC FU Call Status: Today's TOC FU Call Status:: Successful TOC FU Call Completed TOC FU Call Complete Date: 08/27/23 Patient's Name and Date of Birth confirmed.  Transition Care Management Follow-up Telephone Call Date of Discharge: 08/25/23 Discharge Facility: [facility]) Type of Discharge: Inpatient Admission Primary Inpatient Discharge Diagnosis:: "GI bleeding" How have you been since you were released from the hospital?: Better (Pt states he is doing ok-staying with him mom-did not wish to provide address to add on file-states appetitefair-LBM today-taking meds.) Any questions or concerns?: No  Items Reviewed: Did you receive and understand the discharge instructions provided?: Yes Medications obtained,verified, and reconciled?: Partial Review Completed Reason for Partial Mediation Review: pt had o end call before comletion of review and assessment Any new allergies since your discharge?: No Dietary orders reviewed?: Yes Type of Diet Ordered:: low salt/heart healthy Do you have support at home?: Yes People in Home: parent(s) Name of Support/Comfort Primary Source: mom,cousin  Medications Reviewed Today: Medications Reviewed Today     Reviewed by Charlyn Minerva, RN (Registered Nurse) on 08/27/23 at [phone]  Med List Status: <None>   Medication Order Taking? Sig Documenting Provider Last Dose Status Informant  docusate sodium (COLACE) 100 MG capsule [id number]  Take 1 capsule (100 mg total) by mouth 2 (two) times daily as needed (if less than 3 BM"s in past 24 hours). Pennie Banter, DO  Active   feeding supplement (ENSURE ENLIVE / ENSURE PLUS) LIQD [id number]  Take 237 mLs by mouth 2 (two) times daily between meals. Pennie Banter, DO  Active   folic acid (FOLVITE) 1 MG tablet [id number]  Take 1 tablet (1 mg total) by mouth daily.  Patient not taking:  Reported on 07/08/2023   Alford Highland, MD  Active Self, Pharmacy Records  furosemide (LASIX) 40 MG tablet [id number]  Take 1 tablet (40 mg total) by mouth daily. Pennie Banter, DO  Active   gabapentin (NEURONTIN) 100 MG capsule [id number]  Take 1 capsule (100 mg total) by mouth 2 (two) times daily. Alford Highland, MD  Expired 08/08/23 2359   hydrocortisone (CORTEF) 5 MG tablet [id number]  Take 1 tablet (5 mg total) by mouth 2 (two) times daily. Pennie Banter, DO  Active   lactulose (CHRONULAC) 10 GM/15ML solution [id number] Yes Take 45 mLs (30 g total) by mouth 3 (three) times daily. Pennie Banter, DO Taking Active   methocarbamol (ROBAXIN) 750 MG tablet [id number]  TAKE 1 TABLET (750 MG TOTAL) BY MOUTH 2 (TWO) TIMES DAILY AS NEEDED FOR MUSCLE SPASMS. Medina-Vargas, Monina C, NP  Active Self, Pharmacy Records  midodrine (PROAMATINE) 10 MG tablet [id number] Yes Take 1 tablet (10 mg total) by mouth 3 (three) times daily with meals. Pennie Banter, DO Taking Active   Multiple Vitamin (MULTIVITAMIN WITH MINERALS) TABS tablet [id number]  Take 1 tablet by mouth daily.  Patient not taking: Reported on 07/08/2023   Alford Highland, MD  Active Self, Pharmacy Records  nicotine (NICODERM CQ - DOSED IN MG/24 HOURS) 21 mg/24hr patch [id number]  One 21 mg patch chest wall daily (okay to substitute generic)  Patient not taking: Reported on 07/08/2023   Alford Highland, MD  Active Self, Pharmacy Records  oxyCODONE (OXY IR/ROXICODONE) 5 MG immediate release tablet [id number] Yes Take 1 tablet (5 mg total) by mouth every 8 (eight) hours as needed for severe pain. Pennie Banter, DO Taking Active  pantoprazole (PROTONIX) 40 MG tablet [id number]  Take 1 tablet (40 mg total) by mouth 2 (two) times daily. Alford Highland, MD  Expired 08/08/23 2359 Self, Pharmacy Records  polyethylene glycol powder (GLYCOLAX/MIRALAX) 17 GM/SCOOP powder [id number]  Take 17 g by mouth daily as needed (if less than 3 BM"s in  past 24 hours). Esaw Grandchild A, DO  Active   potassium chloride SA (KLOR-CON M) 20 MEQ tablet [id number] Yes Take 1 tablet (20 mEq total) by mouth daily. Pennie Banter, DO Taking Active   QUEtiapine (SEROQUEL) 50 MG tablet [id number]  Take 1 tablet (50 mg total) by mouth at bedtime. Alford Highland, MD  Expired 08/08/23 2359 Self, Pharmacy Records           Med Note [facility], TIFFANY A   Fri Aug 08, 2023  8:50 PM) Ran out of these but was taking  rosuvastatin (CRESTOR) 40 MG tablet [id number]  TAKE 1 TABLET BY MOUTH EVERY DAY Medina-Vargas, Monina C, NP  Active Self, Pharmacy Records  thiamine (VITAMIN B1) 100 MG tablet [id number]  Take 1 tablet (100 mg total) by mouth daily.  Patient not taking: Reported on 07/08/2023   Alford Highland, MD  Active Self, Pharmacy Records            Home Care and Equipment/Supplies: Were Home Health Services Ordered?: NA Any new equipment or medical supplies ordered?: NA  Functional Questionnaire: Do you need assistance with bathing/showering or dressing?: No Do you need assistance with meal preparation?: No Do you need assistance with eating?: No Do you have difficulty maintaining continence: No Do you need assistance with getting out of bed/getting out of a chair/moving?: No Do you have difficulty managing or taking your medications?: No  Follow up appointments reviewed: PCP Follow-up appointment confirmed?: Yes Date of PCP follow-up appointment?: 09/04/23 Follow-up Provider: Dr. Grayce Sessions Specialist [facility] Follow-up appointment confirmed?: NA Do you need transportation to your follow-up appointment?: No (pt states he will call [employer] transportation to arrange ride to PCP appt) Do you understand care options if your condition(s) worsen?: Yes-patient verbalized understanding   .thtno Interventions Today    Flowsheet Row Most Recent Value  General Interventions   General Interventions Discussed/Reviewed General Interventions  Discussed, Doctor Visits, Referral to Nurse  [follow up appt scheduled with pt for assigned RN care coordinator for ongoing dx mgmt, education & support]  Doctor Visits Discussed/Reviewed Doctor Visits Discussed, PCP, Specialist  PCP/Specialist Visits Compliance with follow-up visit  Education Interventions   Education Provided Provided Education  Provided Verbal Education On When to see the doctor, Medication  Nutrition Interventions   Nutrition Discussed/Reviewed Nutrition Discussed  Pharmacy Interventions   Pharmacy Dicussed/Reviewed Pharmacy Topics Discussed, Medications and their functions  Safety Interventions   Safety Discussed/Reviewed Safety Discussed       Alessandra Grout [facility] Care Management Care Management Community Coordinator Direct Phone: [phone] Toll Free: [phone] Fax: [phone]

## 2023-08-29 ENCOUNTER — Other Ambulatory Visit: Payer: Self-pay | Admitting: Obstetrics and Gynecology

## 2023-09-02 NOTE — Telephone Encounter (Signed)
Taken care of

## 2023-09-03 ENCOUNTER — Emergency Department: Payer: Medicare HMO

## 2023-09-03 ENCOUNTER — Other Ambulatory Visit: Payer: Self-pay

## 2023-09-03 ENCOUNTER — Inpatient Hospital Stay
Admission: EM | Admit: 2023-09-03 | Discharge: 2023-09-08 | DRG: 432 | Disposition: A | Discharge status: Home / Self Care | Payer: Medicare HMO | Attending: Internal Medicine | Admitting: Internal Medicine

## 2023-09-03 ENCOUNTER — Ambulatory Visit: Payer: Self-pay

## 2023-09-03 DIAGNOSIS — K648 Other hemorrhoids: Secondary | ICD-10-CM | POA: Diagnosis present

## 2023-09-03 DIAGNOSIS — Z8674 Personal history of sudden cardiac arrest: Secondary | ICD-10-CM

## 2023-09-03 DIAGNOSIS — Z8249 Family history of ischemic heart disease and other diseases of the circulatory system: Secondary | ICD-10-CM

## 2023-09-03 DIAGNOSIS — E271 Primary adrenocortical insufficiency: Secondary | ICD-10-CM | POA: Diagnosis present

## 2023-09-03 DIAGNOSIS — I255 Ischemic cardiomyopathy: Secondary | ICD-10-CM | POA: Diagnosis present

## 2023-09-03 DIAGNOSIS — J449 Chronic obstructive pulmonary disease, unspecified: Secondary | ICD-10-CM | POA: Diagnosis not present

## 2023-09-03 DIAGNOSIS — I2510 Atherosclerotic heart disease of native coronary artery without angina pectoris: Secondary | ICD-10-CM | POA: Diagnosis present

## 2023-09-03 DIAGNOSIS — D649 Anemia, unspecified: Secondary | ICD-10-CM | POA: Diagnosis not present

## 2023-09-03 DIAGNOSIS — G8929 Other chronic pain: Secondary | ICD-10-CM | POA: Diagnosis present

## 2023-09-03 DIAGNOSIS — G629 Polyneuropathy, unspecified: Secondary | ICD-10-CM

## 2023-09-03 DIAGNOSIS — I509 Heart failure, unspecified: Secondary | ICD-10-CM | POA: Diagnosis not present

## 2023-09-03 DIAGNOSIS — I5033 Acute on chronic diastolic (congestive) heart failure: Principal | ICD-10-CM | POA: Diagnosis present

## 2023-09-03 DIAGNOSIS — M79605 Pain in left leg: Secondary | ICD-10-CM | POA: Diagnosis not present

## 2023-09-03 DIAGNOSIS — Z87891 Personal history of nicotine dependence: Secondary | ICD-10-CM

## 2023-09-03 DIAGNOSIS — E876 Hypokalemia: Secondary | ICD-10-CM | POA: Diagnosis present

## 2023-09-03 DIAGNOSIS — E785 Hyperlipidemia, unspecified: Secondary | ICD-10-CM | POA: Diagnosis present

## 2023-09-03 DIAGNOSIS — Z955 Presence of coronary angioplasty implant and graft: Secondary | ICD-10-CM

## 2023-09-03 DIAGNOSIS — K5909 Other constipation: Secondary | ICD-10-CM | POA: Diagnosis present

## 2023-09-03 DIAGNOSIS — Z811 Family history of alcohol abuse and dependence: Secondary | ICD-10-CM

## 2023-09-03 DIAGNOSIS — K7031 Alcoholic cirrhosis of liver with ascites: Secondary | ICD-10-CM | POA: Diagnosis not present

## 2023-09-03 DIAGNOSIS — R609 Edema, unspecified: Secondary | ICD-10-CM | POA: Diagnosis not present

## 2023-09-03 DIAGNOSIS — I252 Old myocardial infarction: Secondary | ICD-10-CM | POA: Diagnosis not present

## 2023-09-03 DIAGNOSIS — F1020 Alcohol dependence, uncomplicated: Secondary | ICD-10-CM | POA: Diagnosis present

## 2023-09-03 DIAGNOSIS — F1120 Opioid dependence, uncomplicated: Secondary | ICD-10-CM | POA: Diagnosis not present

## 2023-09-03 DIAGNOSIS — R601 Generalized edema: Secondary | ICD-10-CM | POA: Diagnosis present

## 2023-09-03 DIAGNOSIS — K219 Gastro-esophageal reflux disease without esophagitis: Secondary | ICD-10-CM | POA: Diagnosis present

## 2023-09-03 DIAGNOSIS — I110 Hypertensive heart disease with heart failure: Secondary | ICD-10-CM | POA: Diagnosis not present

## 2023-09-03 DIAGNOSIS — Z880 Allergy status to penicillin: Secondary | ICD-10-CM

## 2023-09-03 DIAGNOSIS — Z79899 Other long term (current) drug therapy: Secondary | ICD-10-CM

## 2023-09-03 DIAGNOSIS — K766 Portal hypertension: Secondary | ICD-10-CM | POA: Diagnosis present

## 2023-09-03 DIAGNOSIS — R140 Abdominal distension (gaseous): Secondary | ICD-10-CM | POA: Diagnosis not present

## 2023-09-03 DIAGNOSIS — K7460 Unspecified cirrhosis of liver: Secondary | ICD-10-CM | POA: Diagnosis present

## 2023-09-03 DIAGNOSIS — F32A Depression, unspecified: Secondary | ICD-10-CM | POA: Diagnosis present

## 2023-09-03 DIAGNOSIS — Z91199 Patient's noncompliance with other medical treatment and regimen due to unspecified reason: Secondary | ICD-10-CM

## 2023-09-03 DIAGNOSIS — I517 Cardiomegaly: Secondary | ICD-10-CM | POA: Diagnosis not present

## 2023-09-03 DIAGNOSIS — R918 Other nonspecific abnormal finding of lung field: Secondary | ICD-10-CM | POA: Diagnosis not present

## 2023-09-03 DIAGNOSIS — K7030 Alcoholic cirrhosis of liver without ascites: Secondary | ICD-10-CM | POA: Diagnosis present

## 2023-09-03 LAB — URINALYSIS, ROUTINE W REFLEX MICROSCOPIC
Bilirubin Urine: NEGATIVE
Glucose, UA: NEGATIVE mg/dL
Hgb urine dipstick: NEGATIVE
Ketones, ur: NEGATIVE mg/dL
Nitrite: NEGATIVE
Protein, ur: NEGATIVE mg/dL
Specific Gravity, Urine: 1.005 (ref 1.005–1.030)
Squamous Epithelial / HPF: 0 /HPF (ref 0–5)
pH: 6 (ref 5.0–8.0)

## 2023-09-03 LAB — CBC
HCT: 28.9 % — ABNORMAL LOW (ref 39.0–52.0)
Hemoglobin: 9.4 g/dL — ABNORMAL LOW (ref 13.0–17.0)
MCH: 29.4 pg (ref 26.0–34.0)
MCHC: 32.5 g/dL (ref 30.0–36.0)
MCV: 90.3 fL (ref 80.0–100.0)
Platelets: 222 10*3/uL (ref 150–400)
RBC: 3.2 MIL/uL — ABNORMAL LOW (ref 4.22–5.81)
RDW: 17.1 % — ABNORMAL HIGH (ref 11.5–15.5)
WBC: 5.7 10*3/uL (ref 4.0–10.5)
nRBC: 0 % (ref 0.0–0.2)

## 2023-09-03 LAB — URINE DRUG SCREEN, QUALITATIVE (ARMC ONLY)
Amphetamines, Ur Screen: NOT DETECTED
Barbiturates, Ur Screen: NOT DETECTED
Benzodiazepine, Ur Scrn: NOT DETECTED
Cannabinoid 50 Ng, Ur ~~LOC~~: NOT DETECTED
Cocaine Metabolite,Ur ~~LOC~~: POSITIVE — AB
MDMA (Ecstasy)Ur Screen: NOT DETECTED
Methadone Scn, Ur: NOT DETECTED
Opiate, Ur Screen: NOT DETECTED
Phencyclidine (PCP) Ur S: NOT DETECTED
Tricyclic, Ur Screen: NOT DETECTED

## 2023-09-03 LAB — AMMONIA: Ammonia: 50 umol/L — ABNORMAL HIGH (ref 9–35)

## 2023-09-03 LAB — COMPREHENSIVE METABOLIC PANEL
ALT: 23 U/L (ref 0–44)
AST: 57 U/L — ABNORMAL HIGH (ref 15–41)
Albumin: 3.7 g/dL (ref 3.5–5.0)
Alkaline Phosphatase: 215 U/L — ABNORMAL HIGH (ref 38–126)
Anion gap: 14 (ref 5–15)
BUN: 6 mg/dL (ref 6–20)
CO2: 22 mmol/L (ref 22–32)
Calcium: 9.2 mg/dL (ref 8.9–10.3)
Chloride: 105 mmol/L (ref 98–111)
Creatinine, Ser: 0.59 mg/dL — ABNORMAL LOW (ref 0.61–1.24)
GFR, Estimated: >60 mL/min (ref >60)
Glucose, Bld: 108 mg/dL — ABNORMAL HIGH (ref 70–99)
Potassium: 3.2 mmol/L — ABNORMAL LOW (ref 3.5–5.1)
Sodium: 141 mmol/L (ref 135–145)
Total Bilirubin: 2.6 mg/dL — ABNORMAL HIGH (ref 0.3–1.2)
Total Protein: 7.7 g/dL (ref 6.5–8.1)

## 2023-09-03 LAB — FERRITIN: Ferritin: 65 ng/mL (ref 24–336)

## 2023-09-03 LAB — IRON AND TIBC
Iron: 44 ug/dL — ABNORMAL LOW (ref 45–182)
Saturation Ratios: 10 % — ABNORMAL LOW (ref 17.9–39.5)
TIBC: 458 ug/dL — ABNORMAL HIGH (ref 250–450)
UIBC: 414 ug/dL

## 2023-09-03 LAB — TROPONIN I (HIGH SENSITIVITY)
Troponin I (High Sensitivity): 19 ng/L — ABNORMAL HIGH (ref <18)
Troponin I (High Sensitivity): 18 ng/L — ABNORMAL HIGH (ref <18)

## 2023-09-03 LAB — BRAIN NATRIURETIC PEPTIDE: B Natriuretic Peptide: 466.7 pg/mL — ABNORMAL HIGH (ref 0.0–100.0)

## 2023-09-03 MED ORDER — ROSUVASTATIN CALCIUM 10 MG PO TABS
40.0000 mg | ORAL_TABLET | Freq: Every day | ORAL | Status: DC
  Administered 2023-09-03 – 2023-09-08 (×6): 40 mg via ORAL
  Filled 2023-09-03: qty 2
  Filled 2023-09-03 (×5): qty 4

## 2023-09-03 MED ORDER — LORAZEPAM 1 MG PO TABS
1.0000 mg | ORAL_TABLET | ORAL | Status: AC | PRN
  Administered 2023-09-04: 1 mg via ORAL
  Filled 2023-09-03: qty 1

## 2023-09-03 MED ORDER — HYDROCORTISONE 5 MG PO TABS
5.0000 mg | ORAL_TABLET | Freq: Every day | ORAL | Status: DC
  Administered 2023-09-03: 5 mg via ORAL
  Filled 2023-09-03: qty 1

## 2023-09-03 MED ORDER — LORAZEPAM 2 MG/ML IJ SOLN
1.0000 mg | INTRAMUSCULAR | Status: AC | PRN
  Administered 2023-09-03: 2 mg via INTRAVENOUS
  Administered 2023-09-03: 1 mg via INTRAVENOUS
  Filled 2023-09-03 (×2): qty 1

## 2023-09-03 MED ORDER — ACETAMINOPHEN 650 MG RE SUPP: 650.0000 mg | Freq: Four times a day (QID) | RECTAL | Status: DC | PRN

## 2023-09-03 MED ORDER — POTASSIUM CHLORIDE CRYS ER 20 MEQ PO TBCR
40.0000 meq | EXTENDED_RELEASE_TABLET | Freq: Once | ORAL | Status: AC
  Administered 2023-09-03: 40 meq via ORAL
  Filled 2023-09-03: qty 2

## 2023-09-03 MED ORDER — THIAMINE HCL 100 MG/ML IJ SOLN
100.0000 mg | Freq: Once | INTRAMUSCULAR | Status: AC
  Administered 2023-09-03: 100 mg via INTRAVENOUS
  Filled 2023-09-03: qty 2

## 2023-09-03 MED ORDER — ENSURE ENLIVE PO LIQD
237.0000 mL | Freq: Two times a day (BID) | ORAL | Status: DC
  Administered 2023-09-03 – 2023-09-07 (×6): 237 mL via ORAL

## 2023-09-03 MED ORDER — FUROSEMIDE 10 MG/ML IJ SOLN
40.0000 mg | Freq: Once | INTRAMUSCULAR | Status: AC
  Administered 2023-09-03: 40 mg via INTRAVENOUS
  Filled 2023-09-03: qty 4

## 2023-09-03 MED ORDER — MIDODRINE HCL 5 MG PO TABS
10.0000 mg | ORAL_TABLET | Freq: Three times a day (TID) | ORAL | Status: DC
  Administered 2023-09-03 – 2023-09-08 (×15): 10 mg via ORAL
  Filled 2023-09-03 (×15): qty 2

## 2023-09-03 MED ORDER — HYDROCORTISONE 10 MG PO TABS
10.0000 mg | ORAL_TABLET | Freq: Every day | ORAL | Status: DC
  Administered 2023-09-04 – 2023-09-08 (×5): 10 mg via ORAL
  Filled 2023-09-03 (×6): qty 1

## 2023-09-03 MED ORDER — MORPHINE SULFATE (PF) 4 MG/ML IV SOLN
4.0000 mg | Freq: Once | INTRAVENOUS | Status: AC
  Administered 2023-09-03: 4 mg via INTRAVENOUS
  Filled 2023-09-03: qty 1

## 2023-09-03 MED ORDER — FUROSEMIDE 10 MG/ML IJ SOLN
40.0000 mg | Freq: Two times a day (BID) | INTRAMUSCULAR | Status: DC
  Administered 2023-09-03 – 2023-09-08 (×10): 40 mg via INTRAVENOUS
  Filled 2023-09-03 (×10): qty 4

## 2023-09-03 MED ORDER — THIAMINE HCL 100 MG/ML IJ SOLN: 100.0000 mg | Freq: Every day | INTRAMUSCULAR | Status: DC

## 2023-09-03 MED ORDER — FOLIC ACID 1 MG PO TABS
1.0000 mg | ORAL_TABLET | Freq: Every day | ORAL | Status: DC
  Administered 2023-09-03 – 2023-09-08 (×6): 1 mg via ORAL
  Filled 2023-09-03 (×6): qty 1

## 2023-09-03 MED ORDER — HYDROCORTISONE ACETATE 25 MG RE SUPP
25.0000 mg | Freq: Two times a day (BID) | RECTAL | Status: DC
  Filled 2023-09-03 (×11): qty 1

## 2023-09-03 MED ORDER — ENOXAPARIN SODIUM 60 MG/0.6ML IJ SOSY: 0.5000 mg/kg | PREFILLED_SYRINGE | INTRAMUSCULAR | Status: DC

## 2023-09-03 MED ORDER — ADULT MULTIVITAMIN W/MINERALS CH
1.0000 | ORAL_TABLET | Freq: Every day | ORAL | Status: DC
  Administered 2023-09-03 – 2023-09-08 (×6): 1 via ORAL
  Filled 2023-09-03 (×6): qty 1

## 2023-09-03 MED ORDER — ENOXAPARIN SODIUM 40 MG/0.4ML IJ SOSY: 40.0000 mg | PREFILLED_SYRINGE | INTRAMUSCULAR | Status: DC

## 2023-09-03 MED ORDER — DOCUSATE SODIUM 100 MG PO CAPS
100.0000 mg | ORAL_CAPSULE | Freq: Two times a day (BID) | ORAL | Status: DC | PRN
  Administered 2023-09-03: 100 mg via ORAL
  Filled 2023-09-03: qty 1

## 2023-09-03 MED ORDER — OXYCODONE HCL 5 MG PO TABS
5.0000 mg | ORAL_TABLET | Freq: Once | ORAL | Status: AC
  Administered 2023-09-03: 5 mg via ORAL
  Filled 2023-09-03: qty 1

## 2023-09-03 MED ORDER — PANTOPRAZOLE SODIUM 40 MG PO TBEC
40.0000 mg | DELAYED_RELEASE_TABLET | Freq: Two times a day (BID) | ORAL | Status: DC
  Administered 2023-09-03 – 2023-09-08 (×11): 40 mg via ORAL
  Filled 2023-09-03 (×11): qty 1

## 2023-09-03 MED ORDER — QUETIAPINE FUMARATE 25 MG PO TABS
50.0000 mg | ORAL_TABLET | Freq: Every day | ORAL | Status: DC
  Administered 2023-09-03 – 2023-09-07 (×5): 50 mg via ORAL
  Filled 2023-09-03 (×5): qty 2

## 2023-09-03 MED ORDER — GABAPENTIN 100 MG PO CAPS
100.0000 mg | ORAL_CAPSULE | Freq: Two times a day (BID) | ORAL | Status: DC
  Administered 2023-09-03 – 2023-09-08 (×11): 100 mg via ORAL
  Filled 2023-09-03 (×11): qty 1

## 2023-09-03 MED ORDER — METOCLOPRAMIDE HCL 5 MG/ML IJ SOLN: 10.0000 mg | Freq: Four times a day (QID) | INTRAMUSCULAR | Status: DC | PRN

## 2023-09-03 MED ORDER — THIAMINE MONONITRATE 100 MG PO TABS
100.0000 mg | ORAL_TABLET | Freq: Every day | ORAL | Status: DC
  Administered 2023-09-04 – 2023-09-08 (×5): 100 mg via ORAL
  Filled 2023-09-03 (×5): qty 1

## 2023-09-03 MED ORDER — HYDROCORTISONE 10 MG PO TABS: 10.0000 mg | ORAL_TABLET | Freq: Two times a day (BID) | ORAL | Status: DC

## 2023-09-03 MED ORDER — METHOCARBAMOL 500 MG PO TABS
750.0000 mg | ORAL_TABLET | Freq: Two times a day (BID) | ORAL | Status: DC | PRN
  Administered 2023-09-03: 750 mg via ORAL
  Filled 2023-09-03: qty 2

## 2023-09-03 MED ORDER — ACETAMINOPHEN 325 MG PO TABS
650.0000 mg | ORAL_TABLET | Freq: Four times a day (QID) | ORAL | Status: DC | PRN
  Administered 2023-09-03 – 2023-09-04 (×3): 650 mg via ORAL
  Filled 2023-09-03 (×3): qty 2

## 2023-09-03 MED ORDER — POLYETHYLENE GLYCOL 3350 17 GM/SCOOP PO POWD: 17.0000 g | Freq: Every day | ORAL | Status: DC | PRN

## 2023-09-03 MED ORDER — CYCLOBENZAPRINE HCL 10 MG PO TABS
5.0000 mg | ORAL_TABLET | Freq: Three times a day (TID) | ORAL | Status: DC | PRN
  Administered 2023-09-03 – 2023-09-07 (×5): 5 mg via ORAL
  Filled 2023-09-03 (×5): qty 1

## 2023-09-03 MED ORDER — INFLUENZA VIRUS VACC SPLIT PF (FLUZONE) 0.5 ML IM SUSY: 0.5000 mL | PREFILLED_SYRINGE | INTRAMUSCULAR | Status: DC

## 2023-09-03 MED ORDER — OXYCODONE HCL 5 MG PO TABS
5.0000 mg | ORAL_TABLET | Freq: Three times a day (TID) | ORAL | Status: DC | PRN
  Administered 2023-09-03 – 2023-09-04 (×2): 5 mg via ORAL
  Filled 2023-09-03 (×2): qty 1

## 2023-09-03 MED ORDER — MIDODRINE HCL 5 MG PO TABS
10.0000 mg | ORAL_TABLET | Freq: Once | ORAL | Status: AC
  Administered 2023-09-03: 10 mg via ORAL
  Filled 2023-09-03: qty 2

## 2023-09-03 NOTE — ED Provider Notes (Signed)
[facility] Provider Note    Event Date/Time   First MD Initiated Contact with Patient 09/03/23 [phone]     (approximate)   History   Leg Swelling (/)   HPI  Ethan Wells is a 52 y.o. male with past medical history of coronary disease, polysubstance abuse, cirrhosis, alcohol dependence, here with bilateral leg swelling and generalized weakness.  The patient states that for the last several weeks, he separatively worsening bilateral leg swelling.  Is gotten to the point where he essentially cannot walk more than several steps without getting extremely winded and having pain in his legs.  He has had associated shortness of breath.  He states he cannot lay flat.  He is unsure whether he has gained weight or not.  He states has been taking his medications but does not necessarily remember what he is taking.  Denies any fevers.     Physical Exam   Triage Vital Signs: ED Triage Vitals  Encounter Vitals Group     BP 09/03/23 0416 124/61     Systolic BP Percentile --      Diastolic BP Percentile --      Pulse Rate 09/03/23 0416 100     Resp 09/03/23 0416 16     Temp 09/03/23 0416 98.3 F (36.8 C)     Temp Source 09/03/23 0416 Oral     SpO2 09/03/23 0416 98 %     Weight 09/03/23 0419 202 lb 13.2 oz (92 kg)     Height --      Head Circumference --      Peak Flow --      Pain Score 09/03/23 0419 10     Pain Loc --      Pain Education --      Exclude from Growth Chart --     Most recent vital signs: Vitals:   09/03/23 0715 09/03/23 0730  BP: (!) 110/54 110/63  Pulse:  93  Resp:    Temp:    SpO2:  99%     General: Awake, no distress.  Appears chronically ill. CV:  Good peripheral perfusion.  Regular rate and rhythm.  Intermittent tachycardia. Resp:  Slight increased work of breathing with bilateral rales. Abd:  Moderate distention, no rebound or guarding. Other:  3+ pitting edema bilateral lower extremities.  Mild asymmetry with left greater  than right swelling.   ED Results / Procedures / Treatments   Labs (all labs ordered are listed, but only abnormal results are displayed) Labs Reviewed  CBC - Abnormal; Notable for the following components:      Result Value   RBC 3.20 (*)    Hemoglobin 9.4 (*)    HCT 28.9 (*)    RDW 17.1 (*)    All other components within normal limits  COMPREHENSIVE METABOLIC PANEL - Abnormal; Notable for the following components:   Potassium 3.2 (*)    Glucose, Bld 108 (*)    Creatinine, Ser 0.59 (*)    AST 57 (*)    Alkaline Phosphatase 215 (*)    Total Bilirubin 2.6 (*)    All other components within normal limits  BRAIN NATRIURETIC PEPTIDE - Abnormal; Notable for the following components:   B Natriuretic Peptide 466.7 (*)    All other components within normal limits  AMMONIA - Abnormal; Notable for the following components:   Ammonia 50 (*)    All other components within normal limits  URINALYSIS, ROUTINE W REFLEX MICROSCOPIC -  Abnormal; Notable for the following components:   Color, Urine STRAW (*)    APPearance CLEAR (*)    Leukocytes,Ua TRACE (*)    Bacteria, UA RARE (*)    All other components within normal limits  URINE DRUG SCREEN, QUALITATIVE ([facility] ONLY) - Abnormal; Notable for the following components:   Cocaine Metabolite,Ur Warden POSITIVE (*)    All other components within normal limits  TROPONIN I (HIGH SENSITIVITY) - Abnormal; Notable for the following components:   Troponin I (High Sensitivity) 19 (*)    All other components within normal limits  TROPONIN I (HIGH SENSITIVITY) - Abnormal; Notable for the following components:   Troponin I (High Sensitivity) 18 (*)    All other components within normal limits     EKG Normal sinus rhythm, Triklo 96.  PR 166, QRS 90, QTc 492.  No acute ST elevations or depression acute evidence of acute ischemia or infarct.  Nonspecific T wave changes.   RADIOLOGY Chest x-ray: Mild bronchitis   I also independently reviewed and agree  with radiologist interpretations.   PROCEDURES:  Critical Care performed: No  .1-3 Lead EKG Interpretation  Performed by: Shaune Pollack, MD Authorized by: Shaune Pollack, MD     Interpretation: non-specific     ECG rate:  90-110   ECG rate assessment: normal     Rhythm: sinus tachycardia     Ectopy: none     Conduction: normal   Comments:     Indication: Weakness     MEDICATIONS ORDERED IN ED: Medications  hydrocortisone (CORTEF) tablet 5 mg (has no administration in time range)  potassium chloride SA (KLOR-CON M) CR tablet 40 mEq (has no administration in time range)  furosemide (LASIX) injection 40 mg (40 mg Intravenous Given 09/03/23 0810)  midodrine (PROAMATINE) tablet 10 mg (10 mg Oral Given 09/03/23 0626)  oxyCODONE (Oxy IR/ROXICODONE) immediate release tablet 5 mg (5 mg Oral Given 09/03/23 0809)  thiamine (VITAMIN B1) injection 100 mg (100 mg Intravenous Given 09/03/23 0809)     IMPRESSION / MDM / ASSESSMENT AND PLAN / ED COURSE  I reviewed the triage vital signs and the nursing notes.                              Differential diagnosis includes, but is not limited to, CHF, decompensated cirrhosis, DVT, anemia, deconditioning, lymphedema  Patient's presentation is most consistent with acute presentation with potential threat to life or bodily function.  The patient is on the cardiac monitor to evaluate for evidence of arrhythmia and/or significant heart rate changes  52 yo M here with bilateral LE edema, SOB with exertion. Pt hypervolemic on exam with diffuse b/l LE edema, abd distension. Likely decompensated CHF and cirrhosis. Mild rales noted but CXR is overall clear. No hypoxia. \Labs show elevated BNP,  o/w are fairly reassuring. Will give IV Lasix, in addition to his usual midodrine and cortef to help prevent hypotension. Admit to medicine.   FINAL CLINICAL IMPRESSION(S) / ED DIAGNOSES   Final diagnoses:  Acute on chronic diastolic congestive heart  failure (HCC)  Alcoholic cirrhosis of liver with ascites (HCC)     Rx / DC Orders   ED Discharge Orders     None        Note:  This document was prepared using Dragon voice recognition software and may include unintentional dictation errors.   Shaune Pollack, MD 09/03/23 [phone]

## 2023-09-03 NOTE — ED Triage Notes (Signed)
Pt presents to ER with c/o BIL leg swelling that has been getting worse since being discharged from hospital in August this year.  Pt reports he has had weight gain, and states he has hx of CHF.  Pt reports he has been taking as rx.  Pt also states he has been told his has liver failure.  Pt is otherwise A&O x4 and in NAD at this time.  Pt has significant swelling to BIL lower legs.

## 2023-09-03 NOTE — H&P (Signed)
History and Physical    Ethan Wells JXB:[id number] DOB: 1971/06/25 DOA: 09/03/2023  PCP: [name], NP (Confirm with patient/family/NH records and if not entered, this has to be entered at [facility] point of entry) Patient coming from: Home  I have personally briefly reviewed patient's old medical records in [facility] Link  Chief Complaint: SOB, leg swelling  HPI: MAHLIK HLADKY is a 52 y.o. male with medical history significant of alcoholic liver cirrhosis s/p TIPS, CAD/STEMI status post PCI and stenting, chronic HFpEF, chronic cocaine abuse, alcohol abuse, orthostatic hypotension and adrenal insufficiency (Addison's disease), chronic narcotic dependence, recurrent GI bleed, presented with anasarca.  Symptoms started about 1 to 2 weeks ago and gradually getting worse patient experienced worsening of leg swelling " pants do not fit" and increasing exertional dyspnea no cough no chest pain no fever or chills.  He also reported occasional rectal bleed with streak of blood on the stool, denied any abdominal pain no nauseous vomiting no diarrhea.  He also has chronic constipation and claimed that he has poor tolerance to lactulose.  ED Course: Afebrile, borderline tachycardia, blood pressure 110/50 oxygen saturation 98% on room air, blood work showed K3.2, creatinine 0.5, bicarb 22, ALB 3.7, AST 57, ALT 23 total bilirubin 2.6.  Hemoglobin 9.4 appear to be his baseline.  Patient was given IV Lasix and midodrine in the ED.  Review of Systems: As per HPI otherwise 14 point review of systems negative.    Past Medical History:  Diagnosis Date   CAD (coronary artery disease)    a. 01/2011 Anterior STEMI/Cath/PCI: LM nl, LAD 100d (3.5x30mm Vision BMS placed), LCX 88m, RI 50, RCA min irregs, EF 40% w/ apical, inferoapical HK; b. 10/2022 NSTSEMI/Cath: LM nl, LAD 5p/m, RI 85, LCX 167m, OM1 mild dzs, OM3 100 fills via L->L collats from dLAD, RCA 80p, 141m, RPDA fills via collats from  LAD. EF 45-50%-->Med Rx.   Cardiac arrest - ventricular fibrillation    a. In setting of STEMI 01/2011   Chronic pain    Cocaine abuse (HCC)    Depression with anxiety    ETOH abuse    a. 6-12 beers/day   GERD (gastroesophageal reflux disease)    GI bleed    Hemorrhoids    Hyperlipidemia    Hypertension    Ischemic cardiomyopathy    a. 06/2011 Echo: EF 45-50%, No rwma; b. 10/2022 Echo: EF 55-60%, no rwma, nl RV fxn.   Marijuana abuse    Migraines    Tobacco abuse    a. 1/2 ppd x 26 yrs    Past Surgical History:  Procedure Laterality Date   COLONOSCOPY  08/09/2021   Procedure: COLONOSCOPY;  Surgeon: Midge Minium, MD;  Location: [facility] ENDOSCOPY;  Service: Endoscopy;;   CORONARY ANGIOPLASTY WITH STENT PLACEMENT     ESOPHAGEAL BANDING  04/08/2023   Procedure: ESOPHAGEAL BANDING;  Surgeon: Tressia Danas, MD;  Location: [facility] ENDOSCOPY;  Service: Gastroenterology;;   ESOPHAGOGASTRODUODENOSCOPY N/A 05/30/2022   Procedure: ESOPHAGOGASTRODUODENOSCOPY (EGD);  Surgeon: Toney Reil, MD;  Location: [facility] ENDOSCOPY;  Service: Gastroenterology;  Laterality: N/A;   ESOPHAGOGASTRODUODENOSCOPY (EGD) WITH PROPOFOL N/A 03/01/2021   Procedure: ESOPHAGOGASTRODUODENOSCOPY (EGD) WITH PROPOFOL;  Surgeon: Toney Reil, MD;  Location: [facility];  Service: Endoscopy;  Laterality: N/A;   ESOPHAGOGASTRODUODENOSCOPY (EGD) WITH PROPOFOL N/A 08/09/2021   Procedure: ESOPHAGOGASTRODUODENOSCOPY (EGD) WITH PROPOFOL;  Surgeon: Midge Minium, MD;  Location: [facility] ENDOSCOPY;  Service: Endoscopy;  Laterality: N/A;   ESOPHAGOGASTRODUODENOSCOPY (EGD) WITH PROPOFOL N/A  03/01/2023   Procedure: ESOPHAGOGASTRODUODENOSCOPY (EGD) WITH PROPOFOL;  Surgeon: Jaynie Collins, DO;  Location: [facility] ENDOSCOPY;  Service: Gastroenterology;  Laterality: N/A;   ESOPHAGOGASTRODUODENOSCOPY (EGD) WITH PROPOFOL N/A 04/08/2023   Procedure: ESOPHAGOGASTRODUODENOSCOPY (EGD) WITH PROPOFOL;  Surgeon: Tressia Danas, MD;   Location: [facility] ENDOSCOPY;  Service: Gastroenterology;  Laterality: N/A;   ESOPHAGOGASTRODUODENOSCOPY (EGD) WITH PROPOFOL N/A 08/09/2023   Procedure: ESOPHAGOGASTRODUODENOSCOPY (EGD) WITH PROPOFOL;  Surgeon: Regis Bill, MD;  Location: [facility] ENDOSCOPY;  Service: Endoscopy;  Laterality: N/A;   ESOPHAGOGASTRODUODENOSCOPY (EGD) WITH PROPOFOL N/A 08/19/2023   Procedure: ESOPHAGOGASTRODUODENOSCOPY (EGD) WITH PROPOFOL;  Surgeon: Regis Bill, MD;  Location: [facility] ENDOSCOPY;  Service: Endoscopy;  Laterality: N/A;   EVALUATION UNDER ANESTHESIA WITH HEMORRHOIDECTOMY N/A 10/31/2022   Procedure: EXAM UNDER ANESTHESIA WITH SUTURE LIGATION OF TWO BLEEDING PEDICLES;  Surgeon: Henrene Dodge, MD;  Location: [facility] ORS;  Service: General;  Laterality: N/A;   FLEXIBLE SIGMOIDOSCOPY N/A 10/02/2022   Procedure: FLEXIBLE SIGMOIDOSCOPY;  Surgeon: Meryl Dare, MD;  Location: [facility] ENDOSCOPY;  Service: Gastroenterology;  Laterality: N/A;   HEMOSTASIS CONTROL  08/09/2023   Procedure: HEMOSTASIS CONTROL;  Surgeon: Regis Bill, MD;  Location: [facility] ENDOSCOPY;  Service: Endoscopy;;   IR TIPS  04/11/2023   IR US GUIDE VASC ACCESS RIGHT  04/11/2023   IR US GUIDE VASC ACCESS RIGHT  04/11/2023   IR US GUIDE VASC ACCESS RIGHT  04/11/2023   LEFT HEART CATH AND CORONARY ANGIOGRAPHY N/A 11/04/2022   Procedure: LEFT HEART CATH AND CORONARY ANGIOGRAPHY;  Surgeon: Iran Ouch, MD;  Location: [facility] INVASIVE CV LAB;  Service: Cardiovascular;  Laterality: N/A;   RADIOLOGY WITH ANESTHESIA N/A 04/11/2023   Procedure: IR WITH ANESTHESIA;  Surgeon: Oley Balm, MD;  Location: [facility] OR;  Service: Radiology;  Laterality: N/A;   VISCERAL ANGIOGRAPHY N/A 10/24/2022   Procedure: VISCERAL ANGIOGRAPHY;  Surgeon: Annice Needy, MD;  Location: [facility] INVASIVE CV LAB;  Service: Cardiovascular;  Laterality: N/A;   VISCERAL ARTERY INTERVENTION N/A 05/09/2023   Procedure: VISCERAL ARTERY INTERVENTION;  Surgeon: Renford Dills, MD;   Location: [facility] INVASIVE CV LAB;  Service: Cardiovascular;  Laterality: N/A;     reports that he has been smoking cigarettes. He has never used smokeless tobacco. He reports current alcohol use of about 12.0 standard drinks of alcohol per week. He reports current drug use. Drugs: Cocaine and Marijuana.  Allergies  Allergen Reactions   Penicillins Hives and Rash    Tolerated Ceftriaxone 10/2022    Family History  Problem Relation Age of Onset   Coronary artery disease Mother        alive   Other Other        2 sisters alive and well   Alcohol abuse Father        died @ 39   Other Paternal Grandmother        PACER     Prior to Admission medications   Medication Sig Start Date End Date Taking? Authorizing Provider  docusate sodium (COLACE) 100 MG capsule Take 1 capsule (100 mg total) by mouth 2 (two) times daily as needed (if less than 3 BM"s in past 24 hours). 08/25/23   Pennie Banter, DO  feeding supplement (ENSURE ENLIVE / ENSURE PLUS) LIQD Take 237 mLs by mouth 2 (two) times daily between meals. 08/25/23   Pennie Banter, DO  folic acid (FOLVITE) 1 MG tablet Take 1 tablet (1 mg total) by mouth daily. Patient not taking: Reported on 07/08/2023 07/05/23  Alford Highland, MD  furosemide (LASIX) 40 MG tablet Take 1 tablet (40 mg total) by mouth daily. 08/25/23 08/24/24  Esaw Grandchild A, DO  gabapentin (NEURONTIN) 100 MG capsule Take 1 capsule (100 mg total) by mouth 2 (two) times daily. 07/05/23 08/08/23  Alford Highland, MD  hydrocortisone (CORTEF) 5 MG tablet Take 1 tablet (5 mg total) by mouth 2 (two) times daily. 08/25/23   Pennie Banter, DO  lactulose (CHRONULAC) 10 GM/15ML solution Take 45 mLs (30 g total) by mouth 3 (three) times daily. 08/25/23   Pennie Banter, DO  methocarbamol (ROBAXIN) 750 MG tablet TAKE 1 TABLET (750 MG TOTAL) BY MOUTH 2 (TWO) TIMES DAILY AS NEEDED FOR MUSCLE SPASMS. 07/28/23   Medina-Vargas, Monina C, NP  midodrine (PROAMATINE) 10 MG tablet Take 1  tablet (10 mg total) by mouth 3 (three) times daily with meals. 08/25/23   Pennie Banter, DO  Multiple Vitamin (MULTIVITAMIN WITH MINERALS) TABS tablet Take 1 tablet by mouth daily. Patient not taking: Reported on 07/08/2023 07/05/23   Alford Highland, MD  nicotine (NICODERM CQ - DOSED IN MG/24 HOURS) 21 mg/24hr patch One 21 mg patch chest wall daily (okay to substitute generic) Patient not taking: Reported on 07/08/2023 07/05/23   Alford Highland, MD  oxyCODONE (OXY IR/ROXICODONE) 5 MG immediate release tablet Take 1 tablet (5 mg total) by mouth every 8 (eight) hours as needed for severe pain. 08/25/23   Pennie Banter, DO  pantoprazole (PROTONIX) 40 MG tablet Take 1 tablet (40 mg total) by mouth 2 (two) times daily. 07/05/23 08/08/23  Alford Highland, MD  polyethylene glycol powder (GLYCOLAX/MIRALAX) 17 GM/SCOOP powder Take 17 g by mouth daily as needed (if less than 3 BM"s in past 24 hours). 08/25/23   Esaw Grandchild A, DO  potassium chloride SA (KLOR-CON M) 20 MEQ tablet Take 1 tablet (20 mEq total) by mouth daily. 08/26/23   Pennie Banter, DO  QUEtiapine (SEROQUEL) 50 MG tablet Take 1 tablet (50 mg total) by mouth at bedtime. 07/05/23 08/08/23  Alford Highland, MD  rosuvastatin (CRESTOR) 40 MG tablet TAKE 1 TABLET BY MOUTH EVERY DAY 07/28/23   Medina-Vargas, Monina C, NP  thiamine (VITAMIN B1) 100 MG tablet Take 1 tablet (100 mg total) by mouth daily. Patient not taking: Reported on 07/08/2023 07/05/23   Alford Highland, MD    Physical Exam: Vitals:   09/03/23 0715 09/03/23 0730 09/03/23 0830 09/03/23 0900  BP: (!) 110/54 110/63 117/72 110/68  Pulse:  93 96 95  Resp:   14 16  Temp:      TempSrc:      SpO2:  99% 99% 96%  Weight:        Constitutional: NAD, calm, comfortable Vitals:   09/03/23 0715 09/03/23 0730 09/03/23 0830 09/03/23 0900  BP: (!) 110/54 110/63 117/72 110/68  Pulse:  93 96 95  Resp:   14 16  Temp:      TempSrc:      SpO2:  99% 99% 96%  Weight:       Eyes:  PERRL, lids and conjunctivae normal ENMT: Mucous membranes are moist. Posterior pharynx clear of any exudate or lesions.Normal dentition.  Neck: normal, supple, no masses, no thyromegaly Respiratory: clear to auscultation bilaterally, no wheezing, no crackles. Normal respiratory effort. No accessory muscle use.  Cardiovascular: Regular rate and rhythm, no murmurs / rubs / gallops.  Anasarca,. 2+ pedal pulses. No carotid bruits.  Abdomen: no tenderness, no masses palpated. No hepatosplenomegaly. Bowel sounds  positive.  Musculoskeletal: no clubbing / cyanosis. No joint deformity upper and lower extremities. Good ROM, no contractures. Normal muscle tone.  Skin: no rashes, lesions, ulcers. No induration Neurologic: CN 2-12 grossly intact. Sensation intact, DTR normal. Strength 5/5 in all 4.  Psychiatric: Normal judgment and insight. Alert and oriented x 3. Normal mood.     Labs on Admission: I have personally reviewed following labs and imaging studies  CBC: Recent Labs  Lab 09/03/23 0445  WBC 5.7  HGB 9.4*  HCT 28.9*  MCV 90.3  PLT 222   Basic Metabolic Panel: Recent Labs  Lab 09/03/23 0445  NA 141  K 3.2*  CL 105  CO2 22  GLUCOSE 108*  BUN 6  CREATININE 0.59*  CALCIUM 9.2   GFR: Estimated Creatinine Clearance: 116 mL/min (A) (by C-G formula based on SCr of 0.59 mg/dL (L)). Liver Function Tests: Recent Labs  Lab 09/03/23 0445  AST 57*  ALT 23  ALKPHOS 215*  BILITOT 2.6*  PROT 7.7  ALBUMIN 3.7   No results for input(s): "LIPASE", "AMYLASE" in the last 168 hours. Recent Labs  Lab 09/03/23 0445  AMMONIA 50*   Coagulation Profile: No results for input(s): "INR", "PROTIME" in the last 168 hours. Cardiac Enzymes: No results for input(s): "CKTOTAL", "CKMB", "CKMBINDEX", "TROPONINI" in the last 168 hours. BNP (last 3 results) No results for input(s): "PROBNP" in the last 8760 hours. HbA1C: No results for input(s): "HGBA1C" in the last 72 hours. CBG: No results  for input(s): "GLUCAP" in the last 168 hours. Lipid Profile: No results for input(s): "CHOL", "HDL", "LDLCALC", "TRIG", "CHOLHDL", "LDLDIRECT" in the last 72 hours. Thyroid Function Tests: No results for input(s): "TSH", "T4TOTAL", "FREET4", "T3FREE", "THYROIDAB" in the last 72 hours. Anemia Panel: No results for input(s): "VITAMINB12", "FOLATE", "FERRITIN", "TIBC", "IRON", "RETICCTPCT" in the last 72 hours. Urine analysis:    Component Value Date/Time   COLORURINE STRAW (A) 09/03/2023 0457   APPEARANCEUR CLEAR (A) 09/03/2023 0457   APPEARANCEUR Clear 03/16/2012 1315   LABSPEC 1.005 09/03/2023 0457   LABSPEC 1.013 03/16/2012 1315   PHURINE 6.0 09/03/2023 0457   GLUCOSEU NEGATIVE 09/03/2023 0457   GLUCOSEU 50 mg/dL 47/82/9562 1308   HGBUR NEGATIVE 09/03/2023 0457   BILIRUBINUR NEGATIVE 09/03/2023 0457   BILIRUBINUR Negative 03/16/2012 1315   KETONESUR NEGATIVE 09/03/2023 0457   PROTEINUR NEGATIVE 09/03/2023 0457   NITRITE NEGATIVE 09/03/2023 0457   LEUKOCYTESUR TRACE (A) 09/03/2023 0457   LEUKOCYTESUR Negative 03/16/2012 1315    Radiological Exams on Admission: DG Chest 1 View  Result Date: 09/03/2023 CLINICAL DATA:  52 year old male with history of shortness of breath. EXAM: CHEST  1 VIEW COMPARISON:  Chest x-ray 08/12/2023. FINDINGS: Previously noted right IJ catheter has been removed. Lung volumes are normal. No consolidative airspace disease. Diffuse interstitial prominence and widespread peribronchial cuffing is again noted, similar to the prior study. No pleural effusions. No pneumothorax. No cephalization of the pulmonary vasculature. Heart size is mildly enlarged. IMPRESSION: 1. The appearance of the chest suggests bronchitis, as above. 2. Mild cardiomegaly. Electronically Signed   By: Trudie Reed M.D.   On: 09/03/2023 05:18    EKG: Independently reviewed.  Sinus rhythm, chronic nonspecific ST changes on multiple leads  Assessment/Plan Principal Problem:   Cirrhosis  of liver (HCC) Active Problems:   Alcoholic cirrhosis (HCC)   Anasarca  (please populate well all problems here in Problem List. (For example, if patient is on BP meds at home and you resume or decide to hold  them, it is a problem that needs to be her. Same for CAD, COPD, HLD and so on)  Acute on chronic HFpEF decompensation Acute on chronic cirrhosis decompensation -Significant fluid overload -Continue IV Lasix -ED ordered IR guided paracentesis -Echocardiogram was done less than 6 months ago, will not repeat at this point.  Rectal bleeding -H&H stable compared to his baseline 4 months ago -Intermittent, compatible with internal hemorrhoid bleeding and patient has not been compliant with suppository hydrocortisone -Check FOBT -Start hydrocortisone suppository  Portal hypertension -Patient reports poor tolerance to lactulose and chronic constipation -Will increase Colace and MiraLAX dosage to titrate to 2-3 bowel movement a day  Hypokalemia -P.o. replacement, recheck level tomorrow  Chronic normocytic anemia -Normal iron study recently -Intermittent lower GI bleed management as above  Chronic adrenal insufficiency -Will double dose current hydrocortisone dosage as patient is in CHF decompensation -Continue midodrine 10 mg 3 times daily  Polysubstance abuse Chronic narcotic dependence -CIWA protocols with as needed benzos -On oxycodone and gabapentin, recommend outpatient pain management follow-up  DVT prophylaxis: TED hose Code Status: Full code Family Communication: None at bedside Disposition Plan: Expect less than 2 midnight hospital Consults called: IR paracentesis Admission status: MedSurg observation  Emeline General MD [facility] Pager [phone]  09/03/2023, 9:33 AM

## 2023-09-03 NOTE — Patient Outreach (Signed)
Care Coordination   Chart Review  Visit Note   09/03/2023 Name: JULLIEN FIELDING MRN: [id number] DOB: Jul 19, 1971  OSMAN CASE is a 52 y.o. year old male who sees Medina-Vargas, Monina C, NP for primary care. I reviewed patient's chart today in preparation to contact patient for an RN care coordination telephone outreach.   What matters to the patients health and wellness today?  N/A    Goals Addressed             This Visit's Progress    RN Care Coordination Activities: further follow up needed       Care Coordination Interventions: Reviewed patient's chart in preparation to contact patient for initial RN care coordination telephone outreach   Noted patient is currently at [facility] Emergency Department at [facility], dx: Acute on chronic diastolic congestive heart failure; Alcoholic cirrhosis of liver with ascites           SDOH assessments and interventions completed:  No     Care Coordination Interventions:  Yes, provided   Follow up plan: Follow up call scheduled for pending patient's discharge to home    Encounter Outcome:  Patient Visit Completed

## 2023-09-03 NOTE — Plan of Care (Signed)
  Problem: Education: Goal: Understanding of cardiac disease, CV risk reduction, and recovery process will improve Outcome: Progressing Goal: Individualized Educational Video(s) Outcome: Progressing   Problem: Activity: Goal: Ability to tolerate increased activity will improve Outcome: Progressing   Problem: Cardiac: Goal: Ability to achieve and maintain adequate cardiovascular perfusion will improve Outcome: Progressing   Problem: Health Behavior/Discharge Planning: Goal: Ability to safely manage health-related needs after discharge will improve Outcome: Progressing   Problem: Activity: Goal: Ability to tolerate increased activity will improve Outcome: Progressing   Problem: Respiratory: Goal: Ability to maintain a clear airway and adequate ventilation will improve Outcome: Progressing   Problem: Role Relationship: Goal: Method of communication will improve Outcome: Progressing   Problem: Education: Goal: Understanding of CV disease, CV risk reduction, and recovery process will improve Outcome: Progressing Goal: Individualized Educational Video(s) Outcome: Progressing   Problem: Activity: Goal: Ability to return to baseline activity level will improve Outcome: Progressing   Problem: Cardiovascular: Goal: Ability to achieve and maintain adequate cardiovascular perfusion will improve Outcome: Progressing Goal: Vascular access site(s) Level 0-1 will be maintained Outcome: Progressing   Problem: Health Behavior/Discharge Planning: Goal: Ability to safely manage health-related needs after discharge will improve Outcome: Progressing   Problem: Education: Goal: Knowledge of General Education information will improve Description: Including pain rating scale, medication(s)/side effects and non-pharmacologic comfort measures Outcome: Progressing   Problem: Health Behavior/Discharge Planning: Goal: Ability to manage health-related needs will improve Outcome:  Progressing   Problem: Clinical Measurements: Goal: Ability to maintain clinical measurements within normal limits will improve Outcome: Progressing Goal: Will remain free from infection Outcome: Progressing Goal: Diagnostic test results will improve Outcome: Progressing Goal: Respiratory complications will improve Outcome: Progressing Goal: Cardiovascular complication will be avoided Outcome: Progressing   Problem: Activity: Goal: Risk for activity intolerance will decrease Outcome: Progressing   Problem: Nutrition: Goal: Adequate nutrition will be maintained Outcome: Progressing   Problem: Coping: Goal: Level of anxiety will decrease Outcome: Progressing   Problem: Elimination: Goal: Will not experience complications related to bowel motility Outcome: Progressing Goal: Will not experience complications related to urinary retention Outcome: Progressing   Problem: Pain Managment: Goal: General experience of comfort will improve Outcome: Progressing   Problem: Safety: Goal: Ability to remain free from injury will improve Outcome: Progressing   Problem: Skin Integrity: Goal: Risk for impaired skin integrity will decrease Outcome: Progressing

## 2023-09-03 NOTE — Progress Notes (Signed)
PHARMACIST - PHYSICIAN COMMUNICATION  CONCERNING:  Enoxaparin (Lovenox) for DVT Prophylaxis    RECOMMENDATION: Patient was prescribed enoxaprin 40mg  q24 hours for VTE prophylaxis.   Filed Weights   09/03/23 0419  Weight: 92 kg (202 lb 13.2 oz)    Body mass index is 32.74 kg/m.  Estimated Creatinine Clearance: 116 mL/min (A) (by C-G formula based on SCr of 0.59 mg/dL (L)).   Based on [facility] policy patient is candidate for enoxaparin 0.5mg /kg TBW SQ every 24 hours based on BMI being >30.  DESCRIPTION: Pharmacy has adjusted enoxaparin dose per [facility] policy.  Patient is now receiving enoxaparin 0.5 mg/kg every 24 hours    Lowella Bandy, PharmD Clinical Pharmacist  09/03/2023 9:36 AM

## 2023-09-04 ENCOUNTER — Observation Stay: Payer: Medicare HMO

## 2023-09-04 ENCOUNTER — Encounter: Payer: Medicare HMO | Admitting: Adult Health

## 2023-09-04 DIAGNOSIS — J449 Chronic obstructive pulmonary disease, unspecified: Secondary | ICD-10-CM | POA: Diagnosis present

## 2023-09-04 DIAGNOSIS — I110 Hypertensive heart disease with heart failure: Secondary | ICD-10-CM | POA: Diagnosis present

## 2023-09-04 DIAGNOSIS — K766 Portal hypertension: Secondary | ICD-10-CM | POA: Diagnosis present

## 2023-09-04 DIAGNOSIS — I252 Old myocardial infarction: Secondary | ICD-10-CM | POA: Diagnosis not present

## 2023-09-04 DIAGNOSIS — F32A Depression, unspecified: Secondary | ICD-10-CM | POA: Diagnosis present

## 2023-09-04 DIAGNOSIS — F1020 Alcohol dependence, uncomplicated: Secondary | ICD-10-CM | POA: Diagnosis present

## 2023-09-04 DIAGNOSIS — R140 Abdominal distension (gaseous): Secondary | ICD-10-CM | POA: Diagnosis not present

## 2023-09-04 DIAGNOSIS — K7460 Unspecified cirrhosis of liver: Secondary | ICD-10-CM | POA: Diagnosis present

## 2023-09-04 DIAGNOSIS — E271 Primary adrenocortical insufficiency: Secondary | ICD-10-CM | POA: Diagnosis present

## 2023-09-04 DIAGNOSIS — K219 Gastro-esophageal reflux disease without esophagitis: Secondary | ICD-10-CM | POA: Diagnosis present

## 2023-09-04 DIAGNOSIS — K5909 Other constipation: Secondary | ICD-10-CM | POA: Diagnosis present

## 2023-09-04 DIAGNOSIS — I255 Ischemic cardiomyopathy: Secondary | ICD-10-CM | POA: Diagnosis present

## 2023-09-04 DIAGNOSIS — D649 Anemia, unspecified: Secondary | ICD-10-CM | POA: Diagnosis present

## 2023-09-04 DIAGNOSIS — E876 Hypokalemia: Secondary | ICD-10-CM | POA: Diagnosis present

## 2023-09-04 DIAGNOSIS — Z87891 Personal history of nicotine dependence: Secondary | ICD-10-CM | POA: Diagnosis not present

## 2023-09-04 DIAGNOSIS — Z955 Presence of coronary angioplasty implant and graft: Secondary | ICD-10-CM | POA: Diagnosis not present

## 2023-09-04 DIAGNOSIS — K648 Other hemorrhoids: Secondary | ICD-10-CM | POA: Diagnosis present

## 2023-09-04 DIAGNOSIS — I5033 Acute on chronic diastolic (congestive) heart failure: Secondary | ICD-10-CM | POA: Diagnosis present

## 2023-09-04 DIAGNOSIS — G8929 Other chronic pain: Secondary | ICD-10-CM | POA: Diagnosis present

## 2023-09-04 DIAGNOSIS — Z79899 Other long term (current) drug therapy: Secondary | ICD-10-CM | POA: Diagnosis not present

## 2023-09-04 DIAGNOSIS — I2510 Atherosclerotic heart disease of native coronary artery without angina pectoris: Secondary | ICD-10-CM | POA: Diagnosis present

## 2023-09-04 DIAGNOSIS — E785 Hyperlipidemia, unspecified: Secondary | ICD-10-CM | POA: Diagnosis present

## 2023-09-04 DIAGNOSIS — K7031 Alcoholic cirrhosis of liver with ascites: Secondary | ICD-10-CM | POA: Diagnosis present

## 2023-09-04 DIAGNOSIS — Z8674 Personal history of sudden cardiac arrest: Secondary | ICD-10-CM | POA: Diagnosis not present

## 2023-09-04 DIAGNOSIS — F1120 Opioid dependence, uncomplicated: Secondary | ICD-10-CM | POA: Diagnosis present

## 2023-09-04 DIAGNOSIS — Z8249 Family history of ischemic heart disease and other diseases of the circulatory system: Secondary | ICD-10-CM | POA: Diagnosis not present

## 2023-09-04 LAB — BASIC METABOLIC PANEL
Anion gap: 9 (ref 5–15)
BUN: 12 mg/dL (ref 6–20)
CO2: 26 mmol/L (ref 22–32)
Calcium: 8.7 mg/dL — ABNORMAL LOW (ref 8.9–10.3)
Chloride: 100 mmol/L (ref 98–111)
Creatinine, Ser: 0.74 mg/dL (ref 0.61–1.24)
GFR, Estimated: >60 mL/min (ref >60)
Glucose, Bld: 105 mg/dL — ABNORMAL HIGH (ref 70–99)
Potassium: 3.1 mmol/L — ABNORMAL LOW (ref 3.5–5.1)
Sodium: 135 mmol/L (ref 135–145)

## 2023-09-04 MED ORDER — OXYCODONE HCL 5 MG PO TABS
5.0000 mg | ORAL_TABLET | Freq: Four times a day (QID) | ORAL | Status: DC | PRN
  Administered 2023-09-04 – 2023-09-08 (×9): 5 mg via ORAL
  Filled 2023-09-04 (×11): qty 1

## 2023-09-04 MED ORDER — POTASSIUM CHLORIDE CRYS ER 20 MEQ PO TBCR
40.0000 meq | EXTENDED_RELEASE_TABLET | ORAL | Status: AC
  Administered 2023-09-04 (×2): 40 meq via ORAL
  Filled 2023-09-04 (×2): qty 2

## 2023-09-04 MED ORDER — LORAZEPAM 0.5 MG PO TABS
0.5000 mg | ORAL_TABLET | Freq: Four times a day (QID) | ORAL | Status: DC | PRN
  Administered 2023-09-05 – 2023-09-07 (×4): 0.5 mg via ORAL
  Filled 2023-09-04 (×4): qty 1

## 2023-09-04 NOTE — Progress Notes (Signed)
Patient was brought to [place] suite for ordered paracentesis. After scanning with ultrasound, no ascitic fluid was identified. Paracentesis was not performed today. Please refer to Imaging tab for full dictation.  Kennieth Francois, PA-C 09/04/2023 12:26 PM

## 2023-09-04 NOTE — Plan of Care (Signed)
Problem: Education: Goal: Knowledge of General Education information will improve Description Including pain rating scale, medication(s)/side effects and non-pharmacologic comfort measures Outcome: Progressing   Problem: Clinical Measurements: Goal: Ability to maintain clinical measurements within normal limits will improve Outcome: Progressing   Problem: Nutrition: Goal: Adequate nutrition will be maintained Outcome: Progressing   Problem: Safety: Goal: Ability to remain free from injury will improve Outcome: Progressing   Problem: Safety: Goal: Ability to remain free from injury will improve Outcome: Progressing

## 2023-09-04 NOTE — Progress Notes (Signed)
PROGRESS NOTE    CALIB WEGLEY  ZOX:[id number] DOB: 1971/03/07 DOA: 09/03/2023 PCP: [name], NP    Brief Narrative:  52 y.o. male with medical history significant of alcoholic liver cirrhosis s/p TIPS, CAD/STEMI status post PCI and stenting, chronic HFpEF, chronic cocaine abuse, alcohol abuse, orthostatic hypotension and adrenal insufficiency (Addison's disease), chronic narcotic dependence, recurrent GI bleed, presented with anasarca.   Symptoms started about 1 to 2 weeks ago and gradually getting worse patient experienced worsening of leg swelling " pants do not fit" and increasing exertional dyspnea no cough no chest pain no fever or chills.  He also reported occasional rectal bleed with streak of blood on the stool, denied any abdominal pain no nauseous vomiting no diarrhea.  He also has chronic constipation and claimed that he has poor tolerance to lactulose.   Assessment & Plan:   Principal Problem:   Cirrhosis of liver (HCC) Active Problems:   Alcoholic cirrhosis (HCC)   Anasarca  Acute on chronic HFpEF decompensation Acute on chronic cirrhosis decompensation Significant fluid overload/anasarca likely secondary to both acute on chronic HFpEF and acute on chronic cirrhosis Ultrasound-guided paracentesis ordered.  No safe fluid pocket No indication for repeat echo, done less than 6 months ago Plan: Continue IV Lasix Monitor UOP Daily weights   Rectal bleeding H&H stable.  Intermittent.  Suspect hemorrhoidal bleeding Plan: Hydrocortisone suppository   Portal hypertension -Patient reports poor tolerance to lactulose and chronic constipation -Will increase Colace and MiraLAX dosage to titrate to 2-3 bowel movement a day   Hypokalemia Monitor and replace as necessary   Chronic normocytic anemia Suspect secondary to multiple chronic illnesses.  Hemoglobin stable   Chronic adrenal insufficiency -Will double dose current hydrocortisone dosage as  patient is in CHF decompensation -Continue midodrine 10 mg 3 times daily   Polysubstance abuse Chronic narcotic dependence -CIWA protocols with as needed benzos -On oxycodone and gabapentin, recommend outpatient pain management follow-up  DVT prophylaxis: TED hose Code Status: Full Family Communication: None Disposition Plan: Status is: Inpatient Remains inpatient appropriate because: Anasarca   Level of care: Med-Surg  Consultants:  None  Procedures:  None  Antimicrobials: None   Subjective: Seen and appear resting in bed.  Reports no interval change in symptoms since admission.  Objective: Vitals:   09/03/23 2040 09/04/23 0347 09/04/23 0605 09/04/23 0809  BP: (!) 104/51 (!) 95/43 100/64 108/61  Pulse: 82 79 81 84  Resp: 18 18  16   Temp: 98.2 F (36.8 C) 98.8 F (37.1 C)  98 F (36.7 C)  TempSrc: Oral Oral  Oral  SpO2: 95% 95% 97% 99%  Weight:        Intake/Output Summary (Last 24 hours) at 09/04/2023 1457 Last data filed at 09/04/2023 1114 Gross per 24 hour  Intake --  Output 2600 ml  Net -2600 ml   Filed Weights   09/03/23 0419  Weight: 92 kg    Examination:  General exam: Appears fatigued and chronically ill Respiratory system: Clear to auscultation. Respiratory effort normal. Cardiovascular system: S1-S2, RRR, no murmurs, 3+ pitting edema BLE Gastrointestinal system: Soft, distention, normal bowel sounds Central nervous system: Alert and oriented. No focal neurological deficits. Extremities: Symmetric 5 x 5 power. Skin: No rashes, lesions or ulcers Psychiatry: Judgement and insight appear impaired. Mood & affect flattened.     Data Reviewed: I have personally reviewed following labs and imaging studies  CBC: Recent Labs  Lab 09/03/23 0445  WBC 5.7  HGB 9.4*  HCT 28.9*  MCV 90.3  PLT 222   Basic Metabolic Panel: Recent Labs  Lab 09/03/23 0445 09/04/23 0313  NA 141 135  K 3.2* 3.1*  CL 105 100  CO2 22 26  GLUCOSE 108* 105*   BUN 6 12  CREATININE 0.59* 0.74  CALCIUM 9.2 8.7*   GFR: Estimated Creatinine Clearance: 116 mL/min (by C-G formula based on SCr of 0.74 mg/dL). Liver Function Tests: Recent Labs  Lab 09/03/23 0445  AST 57*  ALT 23  ALKPHOS 215*  BILITOT 2.6*  PROT 7.7  ALBUMIN 3.7   No results for input(s): "LIPASE", "AMYLASE" in the last 168 hours. Recent Labs  Lab 09/03/23 0445  AMMONIA 50*   Coagulation Profile: No results for input(s): "INR", "PROTIME" in the last 168 hours. Cardiac Enzymes: No results for input(s): "CKTOTAL", "CKMB", "CKMBINDEX", "TROPONINI" in the last 168 hours. BNP (last 3 results) No results for input(s): "PROBNP" in the last 8760 hours. HbA1C: No results for input(s): "HGBA1C" in the last 72 hours. CBG: No results for input(s): "GLUCAP" in the last 168 hours. Lipid Profile: No results for input(s): "CHOL", "HDL", "LDLCALC", "TRIG", "CHOLHDL", "LDLDIRECT" in the last 72 hours. Thyroid Function Tests: No results for input(s): "TSH", "T4TOTAL", "FREET4", "T3FREE", "THYROIDAB" in the last 72 hours. Anemia Panel: Recent Labs    09/03/23 0640  FERRITIN 65  TIBC 458*  IRON 44*   Sepsis Labs: No results for input(s): "PROCALCITON", "LATICACIDVEN" in the last 168 hours.  No results found for this or any previous visit (from the past 240 hour(s)).       Radiology Studies: [place] ASCITES (ABDOMEN LIMITED)  Result Date: 09/04/2023 INDICATION: Abdominal distention, concern for possible ascites. EXAM: ULTRASOUND ABDOMEN LIMITED FINDINGS: Imaging of all 4 quadrants of the abdomen reveal no ascites IMPRESSION: No ascites seen in ultrasound. Paracentesis was not performed today due to lack of ascitic fluid visualized on ultrasound exam. Procedure performed by Mina Marble, PA-C Electronically Signed   By: Gilmer Mor D.O.   On: 09/04/2023 12:28   DG Chest 1 View  Result Date: 09/03/2023 CLINICAL DATA:  52 year old male with history of shortness of breath. EXAM:  CHEST  1 VIEW COMPARISON:  Chest x-ray 08/12/2023. FINDINGS: Previously noted right IJ catheter has been removed. Lung volumes are normal. No consolidative airspace disease. Diffuse interstitial prominence and widespread peribronchial cuffing is again noted, similar to the prior study. No pleural effusions. No pneumothorax. No cephalization of the pulmonary vasculature. Heart size is mildly enlarged. IMPRESSION: 1. The appearance of the chest suggests bronchitis, as above. 2. Mild cardiomegaly. Electronically Signed   By: Trudie Reed M.D.   On: 09/03/2023 05:18        Scheduled Meds:  feeding supplement  237 mL Oral BID BM   folic acid  1 mg Oral Daily   furosemide  40 mg Intravenous BID   gabapentin  100 mg Oral BID   hydrocortisone  25 mg Rectal BID   hydrocortisone  10 mg Oral Daily   influenza vac split trivalent PF  0.5 mL Intramuscular Tomorrow-1000   midodrine  10 mg Oral TID WC   multivitamin with minerals  1 tablet Oral Daily   pantoprazole  40 mg Oral BID   potassium chloride  40 mEq Oral Q2H   QUEtiapine  50 mg Oral QHS   rosuvastatin  40 mg Oral Daily   thiamine  100 mg Oral Daily   Or   thiamine  100 mg Intravenous Daily   Continuous Infusions:  LOS: 0 days    Tresa Moore, MD [facility]   If 7PM-7AM, please contact night-coverage  09/04/2023, 2:57 PM

## 2023-09-05 DIAGNOSIS — K7031 Alcoholic cirrhosis of liver with ascites: Secondary | ICD-10-CM | POA: Diagnosis not present

## 2023-09-05 LAB — BASIC METABOLIC PANEL
Anion gap: 9 (ref 5–15)
BUN: 15 mg/dL (ref 6–20)
CO2: 26 mmol/L (ref 22–32)
Calcium: 8.7 mg/dL — ABNORMAL LOW (ref 8.9–10.3)
Chloride: 104 mmol/L (ref 98–111)
Creatinine, Ser: 0.71 mg/dL (ref 0.61–1.24)
GFR, Estimated: >60 mL/min (ref >60)
Glucose, Bld: 120 mg/dL — ABNORMAL HIGH (ref 70–99)
Potassium: 3.3 mmol/L — ABNORMAL LOW (ref 3.5–5.1)
Sodium: 139 mmol/L (ref 135–145)

## 2023-09-05 LAB — MAGNESIUM: Magnesium: 1.7 mg/dL (ref 1.7–2.4)

## 2023-09-05 MED ORDER — POTASSIUM CHLORIDE CRYS ER 20 MEQ PO TBCR
40.0000 meq | EXTENDED_RELEASE_TABLET | Freq: Once | ORAL | Status: AC
  Administered 2023-09-05: 40 meq via ORAL
  Filled 2023-09-05: qty 2

## 2023-09-05 MED ORDER — MAGNESIUM SULFATE 2 GM/50ML IV SOLN
2.0000 g | Freq: Once | INTRAVENOUS | Status: AC
  Administered 2023-09-05: 2 g via INTRAVENOUS
  Filled 2023-09-05: qty 50

## 2023-09-05 NOTE — Progress Notes (Signed)
PROGRESS NOTE    Ethan Wells  ZOX:[id number] DOB: February 18, 1971 DOA: 09/03/2023 PCP: [name], NP    Brief Narrative:  52 y.o. male with medical history significant of alcoholic liver cirrhosis s/p TIPS, CAD/STEMI status post PCI and stenting, chronic HFpEF, chronic cocaine abuse, alcohol abuse, orthostatic hypotension and adrenal insufficiency (Addison's disease), chronic narcotic dependence, recurrent GI bleed, presented with anasarca.   Symptoms started about 1 to 2 weeks ago and gradually getting worse patient experienced worsening of leg swelling " pants do not fit" and increasing exertional dyspnea no cough no chest pain no fever or chills.  He also reported occasional rectal bleed with streak of blood on the stool, denied any abdominal pain no nauseous vomiting no diarrhea.  He also has chronic constipation and claimed that he has poor tolerance to lactulose.   Assessment & Plan:   Principal Problem:   Cirrhosis of liver (HCC) Active Problems:   Alcoholic cirrhosis (HCC)   Anasarca  Acute on chronic HFpEF decompensation Acute on chronic cirrhosis decompensation Significant fluid overload/anasarca likely secondary to both acute on chronic HFpEF and acute on chronic cirrhosis Ultrasound-guided paracentesis ordered.  No safe fluid pocket No indication for repeat echo, done less than 6 months ago Urine output has been adequate Plan: Continue Lasix Monitor urine output Daily weights    Rectal bleeding H&H stable.  Intermittent.  Suspect hemorrhoidal bleeding Plan: Hydrocortisone suppository, patient has been refusing   Portal hypertension -Patient reports poor tolerance to lactulose and chronic constipation -Colace and MiraLAX.  Titrate to 2-3 soft bowel movements daily   Hypokalemia Monitor and replace as necessary   Chronic normocytic anemia Suspect secondary to multiple chronic illnesses.  Hemoglobin stable   Chronic adrenal  insufficiency -Will double dose current hydrocortisone dosage as patient is in CHF decompensation.  Can return to home dose at time of discharge -Continue midodrine 10 mg 3 times daily   Polysubstance abuse Chronic narcotic dependence -CIWA protocols with as needed benzos -On oxycodone and gabapentin, recommend outpatient pain management follow-up  DVT prophylaxis: TED hose Code Status: Full Family Communication: None Disposition Plan: Status is: Inpatient Remains inpatient appropriate because: Anasarca   Level of care: Med-Surg  Consultants:  None  Procedures:  None  Antimicrobials: None   Subjective: Seen and examined.  Resting in bed.  No acute overnight events  Objective: Vitals:   09/04/23 1953 09/04/23 2141 09/05/23 0148 09/05/23 0845  BP: (!) 97/45 (!) 102/54 (!) 97/53 (!) 98/56  Pulse: 84 84 84 84  Resp: 20 20 20 16   Temp: 98.2 F (36.8 C) 98.3 F (36.8 C) 98.3 F (36.8 C) 98 F (36.7 C)  TempSrc: Oral Oral Oral Oral  SpO2: 98% 100% 99% 98%  Weight:        Intake/Output Summary (Last 24 hours) at 09/05/2023 1147 Last data filed at 09/05/2023 0941 Gross per 24 hour  Intake --  Output 3250 ml  Net -3250 ml   Filed Weights   09/03/23 0419  Weight: 92 kg    Examination:  General exam: NAD.  Fatigued.  Chronically ill-appearing Respiratory system: Clear to auscultation. Respiratory effort normal. Cardiovascular system: S1-S2, RRR, no murmurs, 2+ pitting edema BLE Gastrointestinal system: Soft, distention, normal bowel sounds Central nervous system: Alert and oriented. No focal neurological deficits. Extremities: Symmetric 5 x 5 power. Skin: No rashes, lesions or ulcers Psychiatry: Judgement and insight appear impaired. Mood & affect flattened.     Data Reviewed: I have personally reviewed following labs  and imaging studies  CBC: Recent Labs  Lab 09/03/23 0445  WBC 5.7  HGB 9.4*  HCT 28.9*  MCV 90.3  PLT 222   Basic Metabolic  Panel: Recent Labs  Lab 09/03/23 0445 09/04/23 0313 09/05/23 0810  NA 141 135 139  K 3.2* 3.1* 3.3*  CL 105 100 104  CO2 22 26 26   GLUCOSE 108* 105* 120*  BUN 6 12 15   CREATININE 0.59* 0.74 0.71  CALCIUM 9.2 8.7* 8.7*  MG  --   --  1.7   GFR: Estimated Creatinine Clearance: 116 mL/min (by C-G formula based on SCr of 0.71 mg/dL). Liver Function Tests: Recent Labs  Lab 09/03/23 0445  AST 57*  ALT 23  ALKPHOS 215*  BILITOT 2.6*  PROT 7.7  ALBUMIN 3.7   No results for input(s): "LIPASE", "AMYLASE" in the last 168 hours. Recent Labs  Lab 09/03/23 0445  AMMONIA 50*   Coagulation Profile: No results for input(s): "INR", "PROTIME" in the last 168 hours. Cardiac Enzymes: No results for input(s): "CKTOTAL", "CKMB", "CKMBINDEX", "TROPONINI" in the last 168 hours. BNP (last 3 results) No results for input(s): "PROBNP" in the last 8760 hours. HbA1C: No results for input(s): "HGBA1C" in the last 72 hours. CBG: No results for input(s): "GLUCAP" in the last 168 hours. Lipid Profile: No results for input(s): "CHOL", "HDL", "LDLCALC", "TRIG", "CHOLHDL", "LDLDIRECT" in the last 72 hours. Thyroid Function Tests: No results for input(s): "TSH", "T4TOTAL", "FREET4", "T3FREE", "THYROIDAB" in the last 72 hours. Anemia Panel: Recent Labs    09/03/23 0640  FERRITIN 65  TIBC 458*  IRON 44*   Sepsis Labs: No results for input(s): "PROCALCITON", "LATICACIDVEN" in the last 168 hours.  No results found for this or any previous visit (from the past 240 hour(s)).       Radiology Studies: [place] ASCITES (ABDOMEN LIMITED)  Result Date: 09/04/2023 INDICATION: Abdominal distention, concern for possible ascites. EXAM: ULTRASOUND ABDOMEN LIMITED FINDINGS: Imaging of all 4 quadrants of the abdomen reveal no ascites IMPRESSION: No ascites seen in ultrasound. Paracentesis was not performed today due to lack of ascitic fluid visualized on ultrasound exam. Procedure performed by Mina Marble,  PA-C Electronically Signed   By: Gilmer Mor D.O.   On: 09/04/2023 12:28        Scheduled Meds:  feeding supplement  237 mL Oral BID BM   folic acid  1 mg Oral Daily   furosemide  40 mg Intravenous BID   gabapentin  100 mg Oral BID   hydrocortisone  25 mg Rectal BID   hydrocortisone  10 mg Oral Daily   influenza vac split trivalent PF  0.5 mL Intramuscular Tomorrow-1000   midodrine  10 mg Oral TID WC   multivitamin with minerals  1 tablet Oral Daily   pantoprazole  40 mg Oral BID   QUEtiapine  50 mg Oral QHS   rosuvastatin  40 mg Oral Daily   thiamine  100 mg Oral Daily   Or   thiamine  100 mg Intravenous Daily   Continuous Infusions:   LOS: 1 day    Tresa Moore, MD [facility]   If 7PM-7AM, please contact night-coverage  09/05/2023, 11:47 AM

## 2023-09-05 NOTE — Plan of Care (Signed)
  Problem: Education: Goal: Knowledge of General Education information will improve Description: Including pain rating scale, medication(s)/side effects and non-pharmacologic comfort measures Outcome: Progressing   Problem: Health Behavior/Discharge Planning: Goal: Ability to manage health-related needs will improve Outcome: Progressing   Problem: Clinical Measurements: Goal: Diagnostic test results will improve Outcome: Progressing   Problem: Safety: Goal: Ability to remain free from injury will improve Outcome: Progressing

## 2023-09-05 NOTE — Plan of Care (Signed)
Problem: Respiratory: Goal: Ability to maintain a clear airway and adequate ventilation will improve Outcome: Progressing

## 2023-09-06 DIAGNOSIS — K7031 Alcoholic cirrhosis of liver with ascites: Secondary | ICD-10-CM | POA: Diagnosis not present

## 2023-09-06 LAB — BASIC METABOLIC PANEL
Anion gap: 9 (ref 5–15)
BUN: 13 mg/dL (ref 6–20)
CO2: 23 mmol/L (ref 22–32)
Calcium: 8.8 mg/dL — ABNORMAL LOW (ref 8.9–10.3)
Chloride: 104 mmol/L (ref 98–111)
Creatinine, Ser: 0.74 mg/dL (ref 0.61–1.24)
GFR, Estimated: >60 mL/min (ref >60)
Glucose, Bld: 146 mg/dL — ABNORMAL HIGH (ref 70–99)
Potassium: 3.1 mmol/L — ABNORMAL LOW (ref 3.5–5.1)
Sodium: 136 mmol/L (ref 135–145)

## 2023-09-06 LAB — MAGNESIUM: Magnesium: 1.9 mg/dL (ref 1.7–2.4)

## 2023-09-06 MED ORDER — MAGNESIUM SULFATE 2 GM/50ML IV SOLN
2.0000 g | Freq: Once | INTRAVENOUS | Status: AC
  Administered 2023-09-06: 2 g via INTRAVENOUS
  Filled 2023-09-06: qty 50

## 2023-09-06 MED ORDER — POTASSIUM CHLORIDE CRYS ER 20 MEQ PO TBCR
40.0000 meq | EXTENDED_RELEASE_TABLET | ORAL | Status: AC
  Administered 2023-09-06 (×2): 40 meq via ORAL
  Filled 2023-09-06 (×2): qty 2

## 2023-09-06 NOTE — Evaluation (Signed)
Occupational Therapy Evaluation Patient Details Name: NORAH LEBEL MRN: [id number] DOB: 1971/04/15 Today's Date: 09/06/2023   History of Present Illness 52 y.o. male with medical history significant of alcoholic liver cirrhosis s/p TIPS, CAD/STEMI status post PCI and stenting, chronic HFpEF, chronic cocaine abuse, alcohol abuse, orthostatic hypotension and adrenal insufficiency (Addison's disease), chronic narcotic dependence, recurrent GI bleed, presented with anasarca.   Clinical Impression   Upon entering the room, pt supine in bed and needing encouragement to participate. Pt reports he is still homeless and living in truck outside of his mother's home. He is ind with self care and IADLs but does use RW on occasion. Pt performs bed mobility without assistance and dons B socks before standing with RW to ambulate into bathroom. He demonstrates toilet transfer without assistance and declines further intervention and returns to bed. Pt reports feeling at his baseline but B feel hurt with mobility. Pt does not need skilled OT intervention at this time. OT to complete orders.             Equipment Recommendations  None recommended by OT       Precautions / Restrictions Precautions Precautions: Fall Restrictions Weight Bearing Restrictions: No      Mobility Bed Mobility Overal bed mobility: Modified Independent                  Transfers Overall transfer level: Modified independent Equipment used: Rolling walker (2 wheels) Transfers: Sit to/from Stand Sit to Stand: Modified independent (Device/Increase time)                  Balance Overall balance assessment: Needs assistance Sitting-balance support: No upper extremity supported, Feet supported Sitting balance-Leahy Scale: Normal     Standing balance support: During functional activity, Bilateral upper extremity supported Standing balance-Leahy Scale: Good                             ADL  either performed or assessed with clinical judgement   ADL Overall ADL's : Modified independent                                       General ADL Comments: use of RW for mobility in room and toileting. Pt sits on EOB and dons non slip socks without assistance.     Vision Patient Visual Report: No change from baseline              Pertinent Vitals/Pain Pain Assessment Pain Assessment: Faces Faces Pain Scale: Hurts a little bit Pain Location: B feet and back Pain Descriptors / Indicators: Aching, Discomfort Pain Intervention(s): Monitored during session, Repositioned     Extremity/Trunk Assessment Upper Extremity Assessment Upper Extremity Assessment: Overall WFL for tasks assessed   Lower Extremity Assessment Lower Extremity Assessment: Overall WFL for tasks assessed       Communication Communication Communication: No apparent difficulties   Cognition Arousal: Alert Behavior During Therapy: WFL for tasks assessed/performed Overall Cognitive Status: Within Functional Limits for tasks assessed                                 General Comments: encouragement to participate but agreeable                Home Living Family/patient expects to be discharged to::  Shelter/Homeless (lives in truck)                             Home Equipment: Cane - single Librarian, academic (2 wheels)   Additional Comments: Pt is homeless and lives in truck parked outside of his mother's home.      Prior Functioning/Environment Prior Level of Function : Independent/Modified Independent               ADLs Comments: Pt reports he manages to get things done independently even though sometimes difficult and with some use of RW as needed.                 OT Goals(Current goals can be found in the care plan section) Acute Rehab OT Goals Patient Stated Goal: to go home OT Goal Formulation: With patient Time For Goal Achievement:  09/06/23 Potential to Achieve Goals: Fair  OT Frequency:         AM-PAC OT "6 Clicks" Daily Activity     Outcome Measure Help from another person eating meals?: None Help from another person taking care of personal grooming?: None Help from another person toileting, which includes using toliet, bedpan, or urinal?: None Help from another person bathing (including washing, rinsing, drying)?: None Help from another person to put on and taking off regular upper body clothing?: None Help from another person to put on and taking off regular lower body clothing?: None 6 Click Score: 24   End of Session Equipment Utilized During Treatment: Rolling walker (2 wheels)  Activity Tolerance: Patient tolerated treatment well Patient left: in bed;with call bell/phone within reach;with bed alarm set                   Time: 1453-1506 OT Time Calculation (min): 13 min Charges:  OT General Charges $OT Visit: 1 Visit OT Evaluation $OT Eval Low Complexity: 1 Low  Jackquline Denmark, MS, OTR/L , CBIS ascom [phone]  09/06/23, 3:36 PM

## 2023-09-06 NOTE — Progress Notes (Signed)
PROGRESS NOTE    ARMARI POINT  WUJ:[id number] DOB: [birth year] DOA: 09/03/2023 PCP: [name], NP    Brief Narrative:  52 y.o. male with medical history significant of alcoholic liver cirrhosis s/p TIPS, CAD/STEMI status post PCI and stenting, chronic HFpEF, chronic cocaine abuse, alcohol abuse, orthostatic hypotension and adrenal insufficiency (Addison's disease), chronic narcotic dependence, recurrent GI bleed, presented with anasarca.   Symptoms started about 1 to 2 weeks ago and gradually getting worse patient experienced worsening of leg swelling " pants do not fit" and increasing exertional dyspnea no cough no chest pain no fever or chills.  He also reported occasional rectal bleed with streak of blood on the stool, denied any abdominal pain no nauseous vomiting no diarrhea.  He also has chronic constipation and claimed that he has poor tolerance to lactulose.   Assessment & Plan:   Principal Problem:   Cirrhosis of liver (HCC) Active Problems:   Alcoholic cirrhosis (HCC)   Anasarca  Acute on chronic HFpEF decompensation Acute on chronic cirrhosis decompensation Significant fluid overload/anasarca likely secondary to both acute on chronic HFpEF and acute on chronic cirrhosis Ultrasound-guided paracentesis ordered.  No safe fluid pocket No indication for repeat echo, done less than 6 months ago Urine output has been adequate Plan: Continue IV lasix Monitor urine output Daily weights PTOT evaluations    Rectal bleeding H&H stable.  Intermittent.  Suspect hemorrhoidal bleeding Plan: Hydrocortisone suppository, patient has been refusing   Portal hypertension -Patient reports poor tolerance to lactulose and chronic constipation -Colace and MiraLAX.  Titrate to 2-3 soft bowel movements daily   Hypokalemia Monitor and replace as necessary   Chronic normocytic anemia Suspect secondary to multiple chronic illnesses.  Hemoglobin stable   Chronic  adrenal insufficiency -Continue hydrocortisone to ask home dose.  Can return to home dose at time of discharge -Continue midodrine 10 mg 3 times daily   Polysubstance abuse Chronic narcotic dependence -CIWA protocols with as needed benzos -On oxycodone and gabapentin, recommend outpatient pain management follow-up  DVT prophylaxis: TED hose Code Status: Full Family Communication: None Disposition Plan: Status is: Inpatient Remains inpatient appropriate because: Anasarca   Level of care: Med-Surg  Consultants:  None  Procedures:  None  Antimicrobials: None   Subjective: Seen and examined.  Resting in bed.  No overnight events.  Objective: Vitals:   09/05/23 1803 09/05/23 1949 09/06/23 0419 09/06/23 0827  BP: (!) 114/55 (!) 105/46 (!) 99/48 (!) 112/52  Pulse: 75 77 83 81  Resp: 16 20 20 16   Temp: 98.3 F (36.8 C) 98 F (36.7 C) 98.1 F (36.7 C) 97.8 F (36.6 C)  TempSrc:  Oral Oral Oral  SpO2: 97% 98% 97% 98%  Weight:        Intake/Output Summary (Last 24 hours) at 09/06/2023 1218 Last data filed at 09/05/2023 2031 Gross per 24 hour  Intake 240 ml  Output 1100 ml  Net -860 ml   Filed Weights   09/03/23 0419  Weight: 92 kg    Examination:  General exam: No distress.  Appears fatigued and chronically ill Respiratory system: Clear to auscultation. Respiratory effort normal. Cardiovascular system: S1-S2, RRR, no murmurs, 1+ pitting edema BLE Gastrointestinal system: Soft, distention, normal bowel sounds Central nervous system: Alert and oriented. No focal neurological deficits. Extremities: Symmetric 5 x 5 power. Skin: No rashes, lesions or ulcers Psychiatry: Judgement and insight appear impaired. Mood & affect flattened.     Data Reviewed: I have personally reviewed following labs and  imaging studies  CBC: Recent Labs  Lab 09/03/23 0445  WBC 5.7  HGB 9.4*  HCT 28.9*  MCV 90.3  PLT 222   Basic Metabolic Panel: Recent Labs  Lab  09/03/23 0445 09/04/23 0313 09/05/23 0810 09/06/23 0433  NA 141 135 139 136  K 3.2* 3.1* 3.3* 3.1*  CL 105 100 104 104  CO2 22 26 26 23   GLUCOSE 108* 105* 120* 146*  BUN 6 12 15 13   CREATININE 0.59* 0.74 0.71 0.74  CALCIUM 9.2 8.7* 8.7* 8.8*  MG  --   --  1.7 1.9   GFR: Estimated Creatinine Clearance: 116 mL/min (by C-G formula based on SCr of 0.74 mg/dL). Liver Function Tests: Recent Labs  Lab 09/03/23 0445  AST 57*  ALT 23  ALKPHOS 215*  BILITOT 2.6*  PROT 7.7  ALBUMIN 3.7   No results for input(s): "LIPASE", "AMYLASE" in the last 168 hours. Recent Labs  Lab 09/03/23 0445  AMMONIA 50*   Coagulation Profile: No results for input(s): "INR", "PROTIME" in the last 168 hours. Cardiac Enzymes: No results for input(s): "CKTOTAL", "CKMB", "CKMBINDEX", "TROPONINI" in the last 168 hours. BNP (last 3 results) No results for input(s): "PROBNP" in the last 8760 hours. HbA1C: No results for input(s): "HGBA1C" in the last 72 hours. CBG: No results for input(s): "GLUCAP" in the last 168 hours. Lipid Profile: No results for input(s): "CHOL", "HDL", "LDLCALC", "TRIG", "CHOLHDL", "LDLDIRECT" in the last 72 hours. Thyroid Function Tests: No results for input(s): "TSH", "T4TOTAL", "FREET4", "T3FREE", "THYROIDAB" in the last 72 hours. Anemia Panel: No results for input(s): "VITAMINB12", "FOLATE", "FERRITIN", "TIBC", "IRON", "RETICCTPCT" in the last 72 hours.  Sepsis Labs: No results for input(s): "PROCALCITON", "LATICACIDVEN" in the last 168 hours.  No results found for this or any previous visit (from the past 240 hour(s)).       Radiology Studies: No results found.      Scheduled Meds:  feeding supplement  237 mL Oral BID BM   folic acid  1 mg Oral Daily   furosemide  40 mg Intravenous BID   gabapentin  100 mg Oral BID   hydrocortisone  25 mg Rectal BID   hydrocortisone  10 mg Oral Daily   influenza vac split trivalent PF  0.5 mL Intramuscular Tomorrow-1000    midodrine  10 mg Oral TID WC   multivitamin with minerals  1 tablet Oral Daily   pantoprazole  40 mg Oral BID   QUEtiapine  50 mg Oral QHS   rosuvastatin  40 mg Oral Daily   thiamine  100 mg Oral Daily   Or   thiamine  100 mg Intravenous Daily   Continuous Infusions:  magnesium sulfate bolus IVPB 2 g (09/06/23 1130)     LOS: 2 days    Tresa Moore, MD [facility]   If 7PM-7AM, please contact night-coverage  09/06/2023, 12:18 PM

## 2023-09-06 NOTE — Progress Notes (Signed)
PT Cancellation Note  Patient Details Name: Ethan Wells MRN: [id number] DOB: May 23, 1971   Cancelled Treatment:    Reason Eval/Treat Not Completed: Other (comment). Pt reports just walking with OT and feels at baseline. He is agreeable for PT to sign off. Please re-consult if necessary.    Kysen Wetherington 09/06/2023, 3:25 PM Elizabeth Palau, PT, DPT, [facility] [phone]

## 2023-09-07 DIAGNOSIS — K7031 Alcoholic cirrhosis of liver with ascites: Secondary | ICD-10-CM | POA: Diagnosis not present

## 2023-09-07 LAB — BASIC METABOLIC PANEL
Anion gap: 10 (ref 5–15)
BUN: 13 mg/dL (ref 6–20)
CO2: 22 mmol/L (ref 22–32)
Calcium: 8.7 mg/dL — ABNORMAL LOW (ref 8.9–10.3)
Chloride: 104 mmol/L (ref 98–111)
Creatinine, Ser: 0.71 mg/dL (ref 0.61–1.24)
GFR, Estimated: >60 mL/min (ref >60)
Glucose, Bld: 150 mg/dL — ABNORMAL HIGH (ref 70–99)
Potassium: 3.2 mmol/L — ABNORMAL LOW (ref 3.5–5.1)
Sodium: 136 mmol/L (ref 135–145)

## 2023-09-07 LAB — MAGNESIUM: Magnesium: 2 mg/dL (ref 1.7–2.4)

## 2023-09-07 MED ORDER — POTASSIUM CHLORIDE CRYS ER 20 MEQ PO TBCR
40.0000 meq | EXTENDED_RELEASE_TABLET | ORAL | Status: AC
  Administered 2023-09-07 (×2): 40 meq via ORAL
  Filled 2023-09-07: qty 2

## 2023-09-07 NOTE — Progress Notes (Signed)
PROGRESS NOTE    Ethan Wells  UJW:[id number] DOB: Dec 08, 1971 DOA: 09/03/2023 PCP: [name], NP    Brief Narrative:  52 y.o. male with medical history significant of alcoholic liver cirrhosis s/p TIPS, CAD/STEMI status post PCI and stenting, chronic HFpEF, chronic cocaine abuse, alcohol abuse, orthostatic hypotension and adrenal insufficiency (Addison's disease), chronic narcotic dependence, recurrent GI bleed, presented with anasarca.   Symptoms started about 1 to 2 weeks ago and gradually getting worse patient experienced worsening of leg swelling " pants do not fit" and increasing exertional dyspnea no cough no chest pain no fever or chills.  He also reported occasional rectal bleed with streak of blood on the stool, denied any abdominal pain no nauseous vomiting no diarrhea.  He also has chronic constipation and claimed that he has poor tolerance to lactulose.   Assessment & Plan:   Principal Problem:   Cirrhosis of liver (HCC) Active Problems:   Alcoholic cirrhosis (HCC)   Anasarca  Acute on chronic HFpEF decompensation Acute on chronic cirrhosis decompensation Significant fluid overload/anasarca likely secondary to both acute on chronic HFpEF and acute on chronic cirrhosis Ultrasound-guided paracentesis ordered.  No safe fluid pocket No indication for repeat echo, done less than 6 months ago Urine output has been adequate Plan: Continue IV lasix Monitor urine output Daily weights PTOT evaluations Anticipate discharge 9/23    Rectal bleeding H&H stable.  Intermittent.  Suspect hemorrhoidal bleeding Plan: Hydrocortisone suppository, patient has been refusing   Portal hypertension -Patient reports poor tolerance to lactulose and chronic constipation -Colace and MiraLAX.  Titrate to 2-3 soft bowel movements daily   Hypokalemia Monitor and replace as necessary   Chronic normocytic anemia Suspect secondary to multiple chronic illnesses.   Hemoglobin stable   Chronic adrenal insufficiency -Continue hydrocortisone 10 mg daily -Continue midodrine 10 mg 3 times daily   Polysubstance abuse Chronic narcotic dependence -CIWA protocols with as needed benzos -On oxycodone and gabapentin, recommend outpatient pain management follow-up  DVT prophylaxis: TED hose Code Status: Full Family Communication: None Disposition Plan: Status is: Inpatient Remains inpatient appropriate because: Anasarca   Level of care: Med-Surg  Consultants:  None  Procedures:  None  Antimicrobials: None   Subjective: Seen and examined.  Continues to endorse pain in feet.  No other overnight events.  Objective: Vitals:   09/06/23 1706 09/06/23 2014 09/07/23 0601 09/07/23 0918  BP: 105/61 (!) 93/52 (!) 103/50 (!) 105/52  Pulse: 71 75 83 80  Resp: 16 20 18 20   Temp: 98.6 F (37 C) 98 F (36.7 C) 98.1 F (36.7 C) 98.2 F (36.8 C)  TempSrc: Oral Oral Oral Oral  SpO2: 99% 97% 97% 97%  Weight:        Intake/Output Summary (Last 24 hours) at 09/07/2023 1158 Last data filed at 09/07/2023 1100 Gross per 24 hour  Intake 540 ml  Output 2300 ml  Net -1760 ml   Filed Weights   09/03/23 0419  Weight: 92 kg    Examination:  General exam: No acute distress.  Appears fatigued and chronically ill Respiratory system: Clear to auscultation. Respiratory effort normal. Cardiovascular system: S1-S2, RRR, no murmurs, 1+ pitting edema BLE Gastrointestinal system: Soft, nondistended, normal bowel sounds Central nervous system: Alert and oriented. No focal neurological deficits. Extremities: Symmetric 5 x 5 power. Skin: No rashes, lesions or ulcers Psychiatry: Judgement and insight appear impaired. Mood & affect flattened.     Data Reviewed: I have personally reviewed following labs and imaging studies  CBC:  Recent Labs  Lab 09/03/23 0445  WBC 5.7  HGB 9.4*  HCT 28.9*  MCV 90.3  PLT 222   Basic Metabolic Panel: Recent Labs  Lab  09/03/23 0445 09/04/23 0313 09/05/23 0810 09/06/23 0433 09/07/23 0454  NA 141 135 139 136 136  K 3.2* 3.1* 3.3* 3.1* 3.2*  CL 105 100 104 104 104  CO2 22 26 26 23 22   GLUCOSE 108* 105* 120* 146* 150*  BUN 6 12 15 13 13   CREATININE 0.59* 0.74 0.71 0.74 0.71  CALCIUM 9.2 8.7* 8.7* 8.8* 8.7*  MG  --   --  1.7 1.9 2.0   GFR: Estimated Creatinine Clearance: 116 mL/min (by C-G formula based on SCr of 0.71 mg/dL). Liver Function Tests: Recent Labs  Lab 09/03/23 0445  AST 57*  ALT 23  ALKPHOS 215*  BILITOT 2.6*  PROT 7.7  ALBUMIN 3.7   No results for input(s): "LIPASE", "AMYLASE" in the last 168 hours. Recent Labs  Lab 09/03/23 0445  AMMONIA 50*   Coagulation Profile: No results for input(s): "INR", "PROTIME" in the last 168 hours. Cardiac Enzymes: No results for input(s): "CKTOTAL", "CKMB", "CKMBINDEX", "TROPONINI" in the last 168 hours. BNP (last 3 results) No results for input(s): "PROBNP" in the last 8760 hours. HbA1C: No results for input(s): "HGBA1C" in the last 72 hours. CBG: No results for input(s): "GLUCAP" in the last 168 hours. Lipid Profile: No results for input(s): "CHOL", "HDL", "LDLCALC", "TRIG", "CHOLHDL", "LDLDIRECT" in the last 72 hours. Thyroid Function Tests: No results for input(s): "TSH", "T4TOTAL", "FREET4", "T3FREE", "THYROIDAB" in the last 72 hours. Anemia Panel: No results for input(s): "VITAMINB12", "FOLATE", "FERRITIN", "TIBC", "IRON", "RETICCTPCT" in the last 72 hours.  Sepsis Labs: No results for input(s): "PROCALCITON", "LATICACIDVEN" in the last 168 hours.  No results found for this or any previous visit (from the past 240 hour(s)).       Radiology Studies: No results found.      Scheduled Meds:  feeding supplement  237 mL Oral BID BM   folic acid  1 mg Oral Daily   furosemide  40 mg Intravenous BID   gabapentin  100 mg Oral BID   hydrocortisone  25 mg Rectal BID   hydrocortisone  10 mg Oral Daily   influenza vac split  trivalent PF  0.5 mL Intramuscular Tomorrow-1000   midodrine  10 mg Oral TID WC   multivitamin with minerals  1 tablet Oral Daily   pantoprazole  40 mg Oral BID   potassium chloride  40 mEq Oral Q2H   QUEtiapine  50 mg Oral QHS   rosuvastatin  40 mg Oral Daily   thiamine  100 mg Oral Daily   Or   thiamine  100 mg Intravenous Daily   Continuous Infusions:     LOS: 3 days    Tresa Moore, MD [facility]   If 7PM-7AM, please contact night-coverage  09/07/2023, 11:58 AM

## 2023-09-08 DIAGNOSIS — K7031 Alcoholic cirrhosis of liver with ascites: Secondary | ICD-10-CM | POA: Diagnosis not present

## 2023-09-08 LAB — BASIC METABOLIC PANEL
Anion gap: 8 (ref 5–15)
BUN: 13 mg/dL (ref 6–20)
CO2: 24 mmol/L (ref 22–32)
Calcium: 9.1 mg/dL (ref 8.9–10.3)
Chloride: 104 mmol/L (ref 98–111)
Creatinine, Ser: 0.67 mg/dL (ref 0.61–1.24)
GFR, Estimated: >60 mL/min (ref >60)
Glucose, Bld: 110 mg/dL — ABNORMAL HIGH (ref 70–99)
Potassium: 3.5 mmol/L (ref 3.5–5.1)
Sodium: 136 mmol/L (ref 135–145)

## 2023-09-08 LAB — MAGNESIUM: Magnesium: 1.9 mg/dL (ref 1.7–2.4)

## 2023-09-08 MED ORDER — OXYCODONE HCL 5 MG PO TABS: 5.0000 mg | ORAL_TABLET | Freq: Three times a day (TID) | ORAL | 0 refills | Status: DC | PRN

## 2023-09-08 MED ORDER — FUROSEMIDE 40 MG PO TABS
40.0000 mg | ORAL_TABLET | Freq: Every day | ORAL | 1 refills | Status: DC
Start: 2023-09-08 — End: 2023-10-18

## 2023-09-08 MED ORDER — GABAPENTIN 100 MG PO CAPS
100.0000 mg | ORAL_CAPSULE | Freq: Two times a day (BID) | ORAL | 0 refills | Status: DC
Start: 2023-09-08 — End: 2023-10-18

## 2023-09-08 MED ORDER — QUETIAPINE FUMARATE 50 MG PO TABS: 50.0000 mg | ORAL_TABLET | Freq: Every day | ORAL | 0 refills | Status: DC

## 2023-09-08 MED ORDER — METHOCARBAMOL 750 MG PO TABS: 750.0000 mg | ORAL_TABLET | Freq: Two times a day (BID) | ORAL | 0 refills | Status: DC | PRN

## 2023-09-08 NOTE — Progress Notes (Signed)
Patient reports needing taxi to home address listed in chart, CSW has called [employer] for transport, RN aware.   No additional discharge needs.   Darolyn Rua, [place], MSW, [place] [phone]

## 2023-09-08 NOTE — Care Management Important Message (Signed)
Important Message  Patient Details  Name: Ethan Wells MRN: [id number] Date of Birth: 1971-12-09   Medicare Important Message Given:  Yes     [name] 09/08/2023, 11:21 AM

## 2023-09-08 NOTE — Plan of Care (Signed)
  Problem: Education: Goal: Understanding of cardiac disease, CV risk reduction, and recovery process will improve Outcome: Adequate for Discharge Goal: Individualized Educational Video(s) Outcome: Adequate for Discharge   Problem: Activity: Goal: Ability to tolerate increased activity will improve Outcome: Adequate for Discharge   Problem: Cardiac: Goal: Ability to achieve and maintain adequate cardiovascular perfusion will improve Outcome: Adequate for Discharge   Problem: Health Behavior/Discharge Planning: Goal: Ability to safely manage health-related needs after discharge will improve Outcome: Adequate for Discharge   Problem: Activity: Goal: Ability to tolerate increased activity will improve Outcome: Adequate for Discharge   Problem: Respiratory: Goal: Ability to maintain a clear airway and adequate ventilation will improve Outcome: Adequate for Discharge   Problem: Role Relationship: Goal: Method of communication will improve Outcome: Adequate for Discharge   Problem: Education: Goal: Understanding of CV disease, CV risk reduction, and recovery process will improve Outcome: Adequate for Discharge Goal: Individualized Educational Video(s) Outcome: Adequate for Discharge   Problem: Activity: Goal: Ability to return to baseline activity level will improve Outcome: Adequate for Discharge   Problem: Cardiovascular: Goal: Ability to achieve and maintain adequate cardiovascular perfusion will improve Outcome: Adequate for Discharge Goal: Vascular access site(s) Level 0-1 will be maintained Outcome: Adequate for Discharge   Problem: Health Behavior/Discharge Planning: Goal: Ability to safely manage health-related needs after discharge will improve Outcome: Adequate for Discharge   Problem: Education: Goal: Knowledge of General Education information will improve Description: Including pain rating scale, medication(s)/side effects and non-pharmacologic comfort  measures 09/08/2023 1045 by Milderd Meager, RN Outcome: Adequate for Discharge 09/08/2023 1045 by Milderd Meager, RN Outcome: Progressing   Problem: Health Behavior/Discharge Planning: Goal: Ability to manage health-related needs will improve Outcome: Adequate for Discharge   Problem: Clinical Measurements: Goal: Ability to maintain clinical measurements within normal limits will improve Outcome: Adequate for Discharge Goal: Will remain free from infection Outcome: Adequate for Discharge Goal: Diagnostic test results will improve Outcome: Adequate for Discharge Goal: Respiratory complications will improve 09/08/2023 1045 by Milderd Meager, RN Outcome: Adequate for Discharge 09/08/2023 1045 by Milderd Meager, RN Outcome: Progressing Goal: Cardiovascular complication will be avoided Outcome: Adequate for Discharge   Problem: Activity: Goal: Risk for activity intolerance will decrease 09/08/2023 1045 by Milderd Meager, RN Outcome: Adequate for Discharge 09/08/2023 1045 by Milderd Meager, RN Outcome: Progressing   Problem: Nutrition: Goal: Adequate nutrition will be maintained 09/08/2023 1045 by Milderd Meager, RN Outcome: Adequate for Discharge 09/08/2023 1045 by Milderd Meager, RN Outcome: Progressing   Problem: Coping: Goal: Level of anxiety will decrease Outcome: Adequate for Discharge   Problem: Elimination: Goal: Will not experience complications related to bowel motility Outcome: Adequate for Discharge Goal: Will not experience complications related to urinary retention Outcome: Adequate for Discharge   Problem: Pain Managment: Goal: General experience of comfort will improve Outcome: Adequate for Discharge   Problem: Safety: Goal: Ability to remain free from injury will improve Outcome: Adequate for Discharge   Problem: Skin Integrity: Goal: Risk for impaired skin integrity will decrease Outcome: Adequate for Discharge

## 2023-09-08 NOTE — Discharge Summary (Signed)
Physician Discharge Summary  WESTER TERREL NWG:[id number] DOB: 1971-07-29 DOA: 09/03/2023  PCP: [name], NP  Admit date: 09/03/2023 Discharge date: 09/08/2023  Admitted From: Home Disposition:  Home  Recommendations for Outpatient Follow-up:  Follow up with PCP in 1-2 weeks   Home Health: None Equipment/Devices: None  Discharge Condition: Stable CODE STATUS: Full Diet recommendation: Heart healthy  Brief/Interim Summary:  52 y.o. male with medical history significant of alcoholic liver cirrhosis s/p TIPS, CAD/STEMI status post PCI and stenting, chronic HFpEF, chronic cocaine abuse, alcohol abuse, orthostatic hypotension and adrenal insufficiency (Addison's disease), chronic narcotic dependence, recurrent GI bleed, presented with anasarca.   Symptoms started about 1 to 2 weeks ago and gradually getting worse patient experienced worsening of leg swelling " pants do not fit" and increasing exertional dyspnea no cough no chest pain no fever or chills.  He also reported occasional rectal bleed with streak of blood on the stool, denied any abdominal pain no nauseous vomiting no diarrhea.  He also has chronic constipation and claimed that he has poor tolerance to lactulose.  Diuresed aggressively in hospital.  11.5 L net negative at time of discharge.  Stable for DC home at this time.  Medication adherence reinforced before DC     Discharge Diagnoses:  Principal Problem:   Cirrhosis of liver (HCC) Active Problems:   Alcoholic cirrhosis (HCC)   Anasarca   Acute on chronic HFpEF decompensation Acute on chronic cirrhosis decompensation Significant fluid overload/anasarca likely secondary to both acute on chronic HFpEF and acute on chronic cirrhosis Ultrasound-guided paracentesis ordered.  No safe fluid pocket No indication for repeat echo, done less than 6 months ago Urine output has been adequate Plan: Diuresed effectively in house.  11.5 L net negative at time  of discharge.  Euvolemia achieved.  No indication for paracentesis.  No indication of repeat echocardiogram.  Reinforced need for medication adherence.  Patient expressed understanding.  Stable for discharge home.  Discharge Instructions  Discharge Instructions     Diet - low sodium heart healthy   Complete by: As directed    Increase activity slowly   Complete by: As directed       Allergies as of 09/08/2023       Reactions   Penicillins Hives, Rash   Tolerated Ceftriaxone 10/2022        Medication List     STOP taking these medications    folic acid 1 MG tablet Commonly known as: FOLVITE   multivitamin with minerals Tabs tablet   nicotine 21 mg/24hr patch Commonly known as: NICODERM CQ - dosed in mg/24 hours   pantoprazole 40 MG tablet Commonly known as: PROTONIX   thiamine 100 MG tablet Commonly known as: VITAMIN B1       TAKE these medications    docusate sodium 100 MG capsule Commonly known as: COLACE Take 1 capsule (100 mg total) by mouth 2 (two) times daily as needed (if less than 3 BM"s in past 24 hours).   feeding supplement Liqd Take 237 mLs by mouth 2 (two) times daily between meals.   furosemide 40 MG tablet Commonly known as: Lasix Take 1 tablet (40 mg total) by mouth daily.   gabapentin 100 MG capsule Commonly known as: NEURONTIN Take 1 capsule (100 mg total) by mouth 2 (two) times daily.   hydrocortisone 5 MG tablet Commonly known as: CORTEF Take 1 tablet (5 mg total) by mouth 2 (two) times daily.   lactulose 10 GM/15ML solution Commonly known as:  CHRONULAC Take 45 mLs (30 g total) by mouth 3 (three) times daily.   methocarbamol 750 MG tablet Commonly known as: ROBAXIN Take 1 tablet (750 mg total) by mouth 2 (two) times daily as needed for muscle spasms.   midodrine 10 MG tablet Commonly known as: PROAMATINE Take 1 tablet (10 mg total) by mouth 3 (three) times daily with meals.   oxyCODONE 5 MG immediate release  tablet Commonly known as: Oxy IR/ROXICODONE Take 1 tablet (5 mg total) by mouth every 8 (eight) hours as needed for severe pain.   polyethylene glycol powder 17 GM/SCOOP powder Commonly known as: GLYCOLAX/MIRALAX Take 17 g by mouth daily as needed (if less than 3 BM"s in past 24 hours).   potassium chloride SA 20 MEQ tablet Commonly known as: KLOR-CON M Take 1 tablet (20 mEq total) by mouth daily.   QUEtiapine 50 MG tablet Commonly known as: SEROQUEL Take 1 tablet (50 mg total) by mouth at bedtime.   rosuvastatin 40 MG tablet Commonly known as: CRESTOR TAKE 1 TABLET BY MOUTH EVERY DAY        Allergies  Allergen Reactions   Penicillins Hives and Rash    Tolerated Ceftriaxone 10/2022    Consultations: None   Procedures/Studies: [place] ASCITES (ABDOMEN LIMITED)  Result Date: 09/04/2023 INDICATION: Abdominal distention, concern for possible ascites. EXAM: ULTRASOUND ABDOMEN LIMITED FINDINGS: Imaging of all 4 quadrants of the abdomen reveal no ascites IMPRESSION: No ascites seen in ultrasound. Paracentesis was not performed today due to lack of ascitic fluid visualized on ultrasound exam. Procedure performed by Mina Marble, PA-C Electronically Signed   By: Gilmer Mor D.O.   On: 09/04/2023 12:28   DG Chest 1 View  Result Date: 09/03/2023 CLINICAL DATA:  52 year old male with history of shortness of breath. EXAM: CHEST  1 VIEW COMPARISON:  Chest x-ray 08/12/2023. FINDINGS: Previously noted right IJ catheter has been removed. Lung volumes are normal. No consolidative airspace disease. Diffuse interstitial prominence and widespread peribronchial cuffing is again noted, similar to the prior study. No pleural effusions. No pneumothorax. No cephalization of the pulmonary vasculature. Heart size is mildly enlarged. IMPRESSION: 1. The appearance of the chest suggests bronchitis, as above. 2. Mild cardiomegaly. Electronically Signed   By: Trudie Reed M.D.   On: 09/03/2023 05:18   US  RENAL  Result Date: 08/17/2023 CLINICAL DATA:  Left flank pain EXAM: RENAL / URINARY TRACT ULTRASOUND COMPLETE COMPARISON:  08/09/2023 FINDINGS: Right Kidney: Renal measurements: 10.6 x 4.6 x 4.9 cm = volume: 123.8 mL. Echogenicity within normal limits. No mass or hydronephrosis visualized. Left Kidney: Renal measurements: 9.8 x 4.1 x 4.5 cm = volume: 93.5 mL. Echogenicity within normal limits. No mass or hydronephrosis visualized. Bladder: Bladder is decompressed, limiting its evaluation. Stable nonspecific wall thickening. Other: None. IMPRESSION: 1. Unremarkable appearance of the bilateral kidneys. 2. Nonspecific bladder wall thickening given decompressed state, unchanged since prior CT. Electronically Signed   By: Sharlet Salina M.D.   On: 08/17/2023 17:58   DG Chest Port 1 View  Result Date: 08/12/2023 CLINICAL DATA:  Left chest pain, dyspnea EXAM: PORTABLE CHEST 1 VIEW COMPARISON:  08/09/2023 FINDINGS: Right IJ central line tip: Lower SVC. The patient is rotated to the right on today's radiograph, reducing diagnostic sensitivity and specificity. Borderline enlargement of the cardiopericardial silhouette. Mild indistinctness of pulmonary vasculature suggesting pulmonary venous hypertension. No overt edema. No blunting of the costophrenic angles. No airspace opacity identified. IMPRESSION: 1. Borderline enlargement of the cardiopericardial silhouette with mild indistinctness  of pulmonary vasculature suggesting pulmonary venous hypertension. No overt edema. 2. Right IJ central line tip: Lower SVC. Electronically Signed   By: Gaylyn Rong M.D.   On: 08/12/2023 13:18      Subjective: Seen and examined on the day of discharge.  Stable no distress.  Appropriate for discharge home.  Discharge Exam: Vitals:   09/08/23 0340 09/08/23 0800  BP: 99/62 102/60  Pulse: 77 79  Resp: 20 16  Temp: 98.3 F (36.8 C) 98.3 F (36.8 C)  SpO2: 99% 96%   Vitals:   09/07/23 1624 09/07/23 2048 09/08/23 0340  09/08/23 0800  BP: (!) 96/53 (!) 99/50 99/62 102/60  Pulse: 79 73 77 79  Resp: 20 20 20 16   Temp: 98 F (36.7 C) 98.3 F (36.8 C) 98.3 F (36.8 C) 98.3 F (36.8 C)  TempSrc: Oral Oral Oral Oral  SpO2: 98% 98% 99% 96%  Weight:        General: Pt is alert, awake, not in acute distress Cardiovascular: RRR, S1/S2 +, no rubs, no gallops Respiratory: CTA bilaterally, no wheezing, no rhonchi Abdominal: Soft, NT, ND, bowel sounds + Extremities: no edema, no cyanosis    The results of significant diagnostics from this hospitalization (including imaging, microbiology, ancillary and laboratory) are listed below for reference.     Microbiology: No results found for this or any previous visit (from the past 240 hour(s)).   Labs: BNP (last 3 results) Recent Labs    06/30/23 1604 08/08/23 1433 09/03/23 0445  BNP 234.1* 217.7* 466.7*   Basic Metabolic Panel: Recent Labs  Lab 09/04/23 0313 09/05/23 0810 09/06/23 0433 09/07/23 0454 09/08/23 0530  NA 135 139 136 136 136  K 3.1* 3.3* 3.1* 3.2* 3.5  CL 100 104 104 104 104  CO2 26 26 23 22 24   GLUCOSE 105* 120* 146* 150* 110*  BUN 12 15 13 13 13   CREATININE 0.74 0.71 0.74 0.71 0.67  CALCIUM 8.7* 8.7* 8.8* 8.7* 9.1  MG  --  1.7 1.9 2.0 1.9   Liver Function Tests: Recent Labs  Lab 09/03/23 0445  AST 57*  ALT 23  ALKPHOS 215*  BILITOT 2.6*  PROT 7.7  ALBUMIN 3.7   No results for input(s): "LIPASE", "AMYLASE" in the last 168 hours. Recent Labs  Lab 09/03/23 0445  AMMONIA 50*   CBC: Recent Labs  Lab 09/03/23 0445  WBC 5.7  HGB 9.4*  HCT 28.9*  MCV 90.3  PLT 222   Cardiac Enzymes: No results for input(s): "CKTOTAL", "CKMB", "CKMBINDEX", "TROPONINI" in the last 168 hours. BNP: Invalid input(s): "POCBNP" CBG: No results for input(s): "GLUCAP" in the last 168 hours. D-Dimer No results for input(s): "DDIMER" in the last 72 hours. Hgb A1c No results for input(s): "HGBA1C" in the last 72 hours. Lipid  Profile No results for input(s): "CHOL", "HDL", "LDLCALC", "TRIG", "CHOLHDL", "LDLDIRECT" in the last 72 hours. Thyroid function studies No results for input(s): "TSH", "T4TOTAL", "T3FREE", "THYROIDAB" in the last 72 hours.  Invalid input(s): "FREET3" Anemia work up No results for input(s): "VITAMINB12", "FOLATE", "FERRITIN", "TIBC", "IRON", "RETICCTPCT" in the last 72 hours. Urinalysis    Component Value Date/Time   COLORURINE STRAW (A) 09/03/2023 0457   APPEARANCEUR CLEAR (A) 09/03/2023 0457   APPEARANCEUR Clear 03/16/2012 1315   LABSPEC 1.005 09/03/2023 0457   LABSPEC 1.013 03/16/2012 1315   PHURINE 6.0 09/03/2023 0457   GLUCOSEU NEGATIVE 09/03/2023 0457   GLUCOSEU 50 mg/dL 40/98/1191 4782   HGBUR NEGATIVE 09/03/2023 0457  BILIRUBINUR NEGATIVE 09/03/2023 0457   BILIRUBINUR Negative 03/16/2012 1315   KETONESUR NEGATIVE 09/03/2023 0457   PROTEINUR NEGATIVE 09/03/2023 0457   NITRITE NEGATIVE 09/03/2023 0457   LEUKOCYTESUR TRACE (A) 09/03/2023 0457   LEUKOCYTESUR Negative 03/16/2012 1315   Sepsis Labs Recent Labs  Lab 09/03/23 0445  WBC 5.7   Microbiology No results found for this or any previous visit (from the past 240 hour(s)).   Time coordinating discharge: Over 30 minutes  SIGNED:   Tresa Moore, MD  [facility] 09/08/2023, 12:00 PM Pager   If 7PM-7AM, please contact night-coverage

## 2023-09-08 NOTE — Plan of Care (Signed)
Problem: Education: Goal: Knowledge of General Education information will improve Description: Including pain rating scale, medication(s)/side effects and non-pharmacologic comfort measures Outcome: Progressing   Problem: Clinical Measurements: Goal: Respiratory complications will improve Outcome: Progressing   Problem: Activity: Goal: Risk for activity intolerance will decrease Outcome: Progressing   Problem: Nutrition: Goal: Adequate nutrition will be maintained Outcome: Progressing

## 2023-09-10 NOTE — Progress Notes (Deleted)
Advanced Heart Failure Clinic Note   Referring Physician: recent admit PCP: Medina-Vargas, Margit Banda, NP Cardiologist: Verne Carrow, MD   HPI:  Ethan Wells is a 52 y/o male with a history of  Admitted 08/08/23 due to hemorrhagic shock due to acute GI bleed, requiring vasopressors. Required transfusion of 2 units pRBC's. Seen by GI and underwent EGD with findings of non-bleeding duodenal ulcers, one area treated with Purastat for concern of a healing, recently bleeding vessel. Given IV albumin and oral midodrine. CT Angio GI Bleed showed Active GI bleed in the region of the gastric antrum/pylorus and proximal duodenum, Cirrhosis of liver with patent appearing TIPS. GI Dr. Mia Creek and IR oncall Dr. Fredia Sorrow both reviewed and agreed does not look like a variceal bleed by CTA. Due to liver cirrhosis, third-spacing with hypoalbuminemia.   Admitted 09/03/23 due to 1-2 weeks of worsening of leg swelling " pants do not fit" and increasing exertional dyspnea. No cough no chest pain no fever or chills. He also reported occasional rectal bleed with streak of blood on the stool.  Ultrasound-guided paracentesis ordered. No safe fluid pocket. 11.5 L net negative at time of discharge. Hospital course complicated by hypotension and soft BPs, acute withdrawal syndrome with acute hepatic encephalopathy. Lactulose dose was increased on 08/16/2023. 2 units PRBCs were ordered to be transfused for hemoglobin of 6.8 on 08/18/2023. GI was made aware and took for repeat EGD on 08/19/2023. Progressive anasarca requiring IV Lasix augmented with albumin for BP support.   Echo 07/02/11: EF 45-50% Echo 07/13/19: EF 45-50% with mild dilatation of aortic room of 37 mm Echo 12/20/20: EF 55-60% Echo 10/01/22: EF 60-65% with Grade I DD, moderate LAE and trivial Ethan Echo 10/30/22: EF 55-60% Echo 01/17/23: EF 50-55% with mild Ethan Echo 04/11/23: EF 55-60% with mild LVH, Grade II DD, mild Ethan  LHC 11/04/22:    Mid Cx lesion is 100%  stenosed.   Ramus lesion is 85% stenosed.   Prox LAD to Mid LAD lesion is 5% stenosed.   Prox RCA to Mid RCA lesion is 100% stenosed.   Prox RCA lesion is 80% stenosed.   There is mild left ventricular systolic dysfunction.   LV end diastolic pressure is moderately elevated.   The left ventricular ejection fraction is 45-50% by visual estimate.  1.  Severe two-vessel coronary artery disease with chronically occluded mid left circumflex and mid right coronary artery with collaterals.  These vessels were patent with mild disease on previous cardiac catheterization in 2012.  LAD stent is patent with minimal restenosis.  In addition, there is progression of proximal to mid ramus stenosis. 2.  Mildly reduced LV systolic function with an EF of 45%. 3.  Moderately elevated left ventricular end-diastolic pressure at 26 mmHg.  He presents today for his initial visit with a chief complaint of    Review of Systems: [y] = yes, [ ]  = no   General: Weight gain [ ] ; Weight loss [ ] ; Anorexia [ ] ; Fatigue [ ] ; Fever [ ] ; Chills [ ] ; Weakness [ ]   Cardiac: Chest pain/pressure [ ] ; Resting SOB [ ] ; Exertional SOB [ ] ; Orthopnea [ ] ; Pedal Edema [ ] ; Palpitations [ ] ; Syncope [ ] ; Presyncope [ ] ; Paroxysmal nocturnal dyspnea[ ]   Pulmonary: Cough [ ] ; Wheezing[ ] ; Hemoptysis[ ] ; Sputum [ ] ; Snoring [ ]   GI: Vomiting[ ] ; Dysphagia[ ] ; Melena[ ] ; Hematochezia [ ] ; Heartburn[ ] ; Abdominal pain [ ] ; Constipation [ ] ; Diarrhea [ ] ; BRBPR [ ]   GU: Hematuria[ ] ; Dysuria [ ] ; Nocturia[ ]   Vascular: Pain in legs with walking [ ] ; Pain in feet with lying flat [ ] ; Non-healing sores [ ] ; Stroke [ ] ; TIA [ ] ; Slurred speech [ ] ;  Neuro: Headaches[ ] ; Vertigo[ ] ; Seizures[ ] ; Paresthesias[ ] ;Blurred vision [ ] ; Diplopia [ ] ; Vision changes [ ]   Ortho/Skin: Arthritis [ ] ; Joint pain [ ] ; Muscle pain [ ] ; Joint swelling [ ] ; Back Pain [ ] ; Rash [ ]   Psych: Depression[ ] ; Anxiety[ ]   Heme: Bleeding problems [ ] ; Clotting  disorders [ ] ; Anemia [ ]   Endocrine: Diabetes [ ] ; Thyroid dysfunction[ ]    Past Medical History:  Diagnosis Date   CAD (coronary artery disease)    a. 01/2011 Anterior STEMI/Cath/PCI: LM nl, LAD 100d (3.5x66mm Vision BMS placed), LCX 13m, RI 50, RCA min irregs, EF 40% w/ apical, inferoapical HK; b. 10/2022 NSTSEMI/Cath: LM nl, LAD 5p/m, RI 85, LCX 171m, OM1 mild dzs, OM3 100 fills via L->L collats from dLAD, RCA 80p, 169m, RPDA fills via collats from LAD. EF 45-50%-->Med Rx.   Cardiac arrest - ventricular fibrillation    a. In setting of STEMI 01/2011   Chronic pain    Cocaine abuse (HCC)    Depression with anxiety    ETOH abuse    a. 6-12 beers/day   GERD (gastroesophageal reflux disease)    GI bleed    Hemorrhoids    Hyperlipidemia    Hypertension    Ischemic cardiomyopathy    a. 06/2011 Echo: EF 45-50%, No rwma; b. 10/2022 Echo: EF 55-60%, no rwma, nl RV fxn.   Marijuana abuse    Migraines    Tobacco abuse    a. 1/2 ppd x 26 yrs    Current Outpatient Medications  Medication Sig Dispense Refill   docusate sodium (COLACE) 100 MG capsule Take 1 capsule (100 mg total) by mouth 2 (two) times daily as needed (if less than 3 BM"s in past 24 hours).     feeding supplement (ENSURE ENLIVE / ENSURE PLUS) LIQD Take 237 mLs by mouth 2 (two) times daily between meals.     furosemide (LASIX) 40 MG tablet Take 1 tablet (40 mg total) by mouth daily. 30 tablet 1   gabapentin (NEURONTIN) 100 MG capsule Take 1 capsule (100 mg total) by mouth 2 (two) times daily. 60 capsule 0   hydrocortisone (CORTEF) 5 MG tablet Take 1 tablet (5 mg total) by mouth 2 (two) times daily. 60 tablet 2   lactulose (CHRONULAC) 10 GM/15ML solution Take 45 mLs (30 g total) by mouth 3 (three) times daily. 236 mL 2   methocarbamol (ROBAXIN) 750 MG tablet Take 1 tablet (750 mg total) by mouth 2 (two) times daily as needed for muscle spasms. 60 tablet 0   midodrine (PROAMATINE) 10 MG tablet Take 1 tablet (10 mg total) by mouth  3 (three) times daily with meals. 90 tablet 2   oxyCODONE (OXY IR/ROXICODONE) 5 MG immediate release tablet Take 1 tablet (5 mg total) by mouth every 8 (eight) hours as needed for severe pain. 15 tablet 0   polyethylene glycol powder (GLYCOLAX/MIRALAX) 17 GM/SCOOP powder Take 17 g by mouth daily as needed (if less than 3 BM"s in past 24 hours). 238 g 0   potassium chloride SA (KLOR-CON M) 20 MEQ tablet Take 1 tablet (20 mEq total) by mouth daily. 30 tablet 0   QUEtiapine (SEROQUEL) 50 MG tablet Take 1 tablet (50 mg total) by mouth  at bedtime. 30 tablet 0   rosuvastatin (CRESTOR) 40 MG tablet TAKE 1 TABLET BY MOUTH EVERY DAY 90 tablet 1   No current facility-administered medications for this visit.    Allergies  Allergen Reactions   Penicillins Hives and Rash    Tolerated Ceftriaxone 10/2022      Social History   Socioeconomic History   Marital status: Single    Spouse name: Not on file   Number of children: Not on file   Years of education: Not on file   Highest education level: Not on file  Occupational History   Not on file  Tobacco Use   Smoking status: Every Day    Current packs/day: 0.50    Types: Cigarettes   Smokeless tobacco: Never   Tobacco comments:    started age 29  Vaping Use   Vaping status: Never Used  Substance and Sexual Activity   Alcohol use: Yes    Alcohol/week: 12.0 standard drinks of alcohol    Types: 12 Cans of beer per week    Comment: at least a 6 pack of beer daily and often a 12 pack   Drug use: Yes    Types: Cocaine, Marijuana   Sexual activity: Not on file  Other Topics Concern   Not on file  Social History Narrative   Single, lives in [place] with his mother.  He does not routinely exercise.  He sometimes works, helping to deliver appliances to homes.   Social Determinants of Health   Financial Resource Strain: Not on file  Food Insecurity: No Food Insecurity (09/03/2023)   Hunger Vital Sign    Worried About Running Out of Food in  the Last Year: Never true    Ran Out of Food in the Last Year: Never true  Transportation Needs: Unmet Transportation Needs (09/03/2023)   PRAPARE - Administrator, Civil Service (Medical): Yes    Lack of Transportation (Non-Medical): Yes  Physical Activity: Not on file  Stress: Not on file  Social Connections: Not on file  Intimate Partner Violence: Not At Risk (09/03/2023)   Humiliation, Afraid, Rape, and Kick questionnaire    Fear of Current or Ex-Partner: No    Emotionally Abused: No    Physically Abused: No    Sexually Abused: No      Family History  Problem Relation Age of Onset   Coronary artery disease Mother        alive   Other Other        2 sisters alive and well   Alcohol abuse Father        died @ 53   Other Paternal Grandmother        PACER       PHYSICAL EXAM: General:  Well appearing. No respiratory difficulty HEENT: normal Neck: supple. no JVD. Carotids 2+ bilat; no bruits. No lymphadenopathy or thyromegaly appreciated. Cor: PMI nondisplaced. Regular rate & rhythm. No rubs, gallops or murmurs. Lungs: clear Abdomen: soft, nontender, nondistended. No hepatosplenomegaly. No bruits or masses. Good bowel sounds. Extremities: no cyanosis, clubbing, rash, edema Neuro: alert & oriented x 3, cranial nerves grossly intact. moves all 4 extremities w/o difficulty. Affect pleasant.  ECG:   ASSESSMENT & PLAN:  1: Chronic heart failure with preserved ejection fraction- - suspect due to - NYHA class - euvolemic - weighing daily - Echo 07/02/11: EF 45-50% - Echo 07/13/19: EF 45-50% with mild dilatation of aortic room of 37 mm - Echo 12/20/20: EF 55-60% -  Echo 10/01/22: EF 60-65% with Grade I DD, moderate LAE and trivial Ethan - Echo 10/30/22: EF 55-60% - Echo 01/17/23: EF 50-55% with mild Ethan - Echo 04/11/23: EF 55-60% with mild LVH, Grade II DD, mild Ethan - continue  - BNP  2: Hypotension- - BP - saw PCP - BMP  3: CAD- - saw cardiology - LHC  11/04/22:    Mid Cx lesion is 100% stenosed.   Ramus lesion is 85% stenosed.   Prox LAD to Mid LAD lesion is 5% stenosed.   Prox RCA to Mid RCA lesion is 100% stenosed.   Prox RCA lesion is 80% stenosed.   There is mild left ventricular systolic dysfunction.   LV end diastolic pressure is moderately elevated.   The left ventricular ejection fraction is 45-50% by visual estimate.  1.  Severe two-vessel coronary artery disease with chronically occluded mid left circumflex and mid right coronary artery with collaterals.  These vessels were patent with mild disease on previous cardiac catheterization in 2012.  LAD stent is patent with minimal restenosis.  In addition, there is progression of proximal to mid ramus stenosis. 2.  Mildly reduced LV systolic function with an EF of 45%. 3.  Moderately elevated left ventricular end-diastolic pressure at 26 mmHg.  4: Liver cirrhosis d/t alcohol use- - saw GI  5: Substance use-   Delma Freeze, FNP 09/10/23

## 2023-09-11 ENCOUNTER — Encounter: Payer: Medicare HMO | Admitting: Family

## 2023-09-11 ENCOUNTER — Telehealth: Payer: Self-pay | Admitting: Family

## 2023-09-11 NOTE — Telephone Encounter (Signed)
Patient did not show for his initial Heart Failure Clinic appointment on 09/11/23.

## 2023-09-12 ENCOUNTER — Telehealth: Payer: Self-pay | Admitting: *Deleted

## 2023-09-12 NOTE — Progress Notes (Signed)
Care Coordination  Outreach Note  09/12/2023 Name: Ethan Wells MRN: [id number] DOB: [birth year]   Care Coordination Outreach Attempts: An unsuccessful telephone outreach was attempted today to offer the patient information about available care coordination services.  Follow Up Plan:  Additional outreach attempts will be made to offer the patient care coordination information and services.   Encounter Outcome:  No Answer  Christie Nottingham  Care Coordination Care Guide  Direct Dial: [phone]

## 2023-09-15 NOTE — Progress Notes (Signed)
Care Coordination Note  09/15/2023 Name: Ethan Wells MRN: [id number] DOB: [birth year]  Ethan Wells is a 52 y.o. year old male who is a primary care patient of Medina-Vargas, Monina C, NP and is actively engaged with the care management team. I reached out to Regan Lemming by phone today to assist with scheduling an initial visit with the RN Case Manager  Follow up plan: Unsuccessful telephone outreach attempt made. A HIPAA compliant phone message was left for the patient providing contact information and requesting a return call.   [facility]  Care Coordination Care Guide  Direct Dial: [phone]

## 2023-09-22 NOTE — Progress Notes (Signed)
Care Coordination Note  09/22/2023 Name: Ethan Wells MRN: [id number] DOB: 1971/03/29  Ethan Wells is a 52 y.o. year old male who is a primary care patient of Medina-Vargas, Monina C, NP and is actively engaged with the care management team. I reached out to Regan Lemming by phone today to assist with scheduling an initial visit with the Licensed Clinical Social Worker  Follow up plan: Unsuccessful telephone outreach attempt made. A HIPAA compliant phone message was left for the patient providing contact information and requesting a return call.  We have been unable to make contact with the patient for follow up. The care management team is available to follow up with the patient after provider conversation with the patient regarding recommendation for care management engagement and subsequent re-referral to the care management team.   [facility] Coordination Care Guide  Direct Dial: [phone]

## 2023-10-09 ENCOUNTER — Other Ambulatory Visit: Payer: Self-pay | Admitting: Adult Health

## 2023-10-09 DIAGNOSIS — G629 Polyneuropathy, unspecified: Secondary | ICD-10-CM

## 2023-10-15 ENCOUNTER — Other Ambulatory Visit: Payer: Self-pay | Admitting: Adult Health

## 2023-10-15 ENCOUNTER — Emergency Department: Payer: Medicare HMO

## 2023-10-15 ENCOUNTER — Encounter: Payer: Self-pay | Admitting: Emergency Medicine

## 2023-10-15 ENCOUNTER — Emergency Department
Admission: EM | Admit: 2023-10-15 | Discharge: 2023-10-15 | Disposition: A | Discharge status: Home / Self Care | Payer: Medicare HMO | Source: Home / Self Care | Attending: Emergency Medicine | Admitting: Emergency Medicine

## 2023-10-15 DIAGNOSIS — R778 Other specified abnormalities of plasma proteins: Secondary | ICD-10-CM | POA: Diagnosis not present

## 2023-10-15 DIAGNOSIS — R001 Bradycardia, unspecified: Secondary | ICD-10-CM | POA: Diagnosis not present

## 2023-10-15 DIAGNOSIS — I2510 Atherosclerotic heart disease of native coronary artery without angina pectoris: Secondary | ICD-10-CM | POA: Insufficient documentation

## 2023-10-15 DIAGNOSIS — R0602 Shortness of breath: Secondary | ICD-10-CM | POA: Diagnosis not present

## 2023-10-15 DIAGNOSIS — Y908 Blood alcohol level of 240 mg/100 ml or more: Secondary | ICD-10-CM | POA: Insufficient documentation

## 2023-10-15 DIAGNOSIS — F10129 Alcohol abuse with intoxication, unspecified: Secondary | ICD-10-CM | POA: Insufficient documentation

## 2023-10-15 DIAGNOSIS — F17200 Nicotine dependence, unspecified, uncomplicated: Secondary | ICD-10-CM | POA: Insufficient documentation

## 2023-10-15 DIAGNOSIS — Z20822 Contact with and (suspected) exposure to covid-19: Secondary | ICD-10-CM | POA: Insufficient documentation

## 2023-10-15 DIAGNOSIS — F10920 Alcohol use, unspecified with intoxication, uncomplicated: Secondary | ICD-10-CM

## 2023-10-15 DIAGNOSIS — R9431 Abnormal electrocardiogram [ECG] [EKG]: Secondary | ICD-10-CM | POA: Diagnosis not present

## 2023-10-15 DIAGNOSIS — E782 Mixed hyperlipidemia: Secondary | ICD-10-CM

## 2023-10-15 DIAGNOSIS — Z955 Presence of coronary angioplasty implant and graft: Secondary | ICD-10-CM | POA: Diagnosis not present

## 2023-10-15 DIAGNOSIS — E876 Hypokalemia: Secondary | ICD-10-CM | POA: Diagnosis not present

## 2023-10-15 DIAGNOSIS — J40 Bronchitis, not specified as acute or chronic: Secondary | ICD-10-CM

## 2023-10-15 DIAGNOSIS — J209 Acute bronchitis, unspecified: Secondary | ICD-10-CM | POA: Diagnosis not present

## 2023-10-15 DIAGNOSIS — I5033 Acute on chronic diastolic (congestive) heart failure: Secondary | ICD-10-CM | POA: Insufficient documentation

## 2023-10-15 DIAGNOSIS — J441 Chronic obstructive pulmonary disease with (acute) exacerbation: Secondary | ICD-10-CM | POA: Diagnosis not present

## 2023-10-15 DIAGNOSIS — R058 Other specified cough: Secondary | ICD-10-CM | POA: Diagnosis not present

## 2023-10-15 DIAGNOSIS — I110 Hypertensive heart disease with heart failure: Secondary | ICD-10-CM | POA: Insufficient documentation

## 2023-10-15 LAB — CBC
HCT: 35 % — ABNORMAL LOW (ref 39.0–52.0)
Hemoglobin: 11.3 g/dL — ABNORMAL LOW (ref 13.0–17.0)
MCH: 28 pg (ref 26.0–34.0)
MCHC: 32.3 g/dL (ref 30.0–36.0)
MCV: 86.8 fL (ref 80.0–100.0)
Platelets: 109 10*3/uL — ABNORMAL LOW (ref 150–400)
RBC: 4.03 MIL/uL — ABNORMAL LOW (ref 4.22–5.81)
RDW: 18.1 % — ABNORMAL HIGH (ref 11.5–15.5)
WBC: 7.7 10*3/uL (ref 4.0–10.5)
nRBC: 0 % (ref 0.0–0.2)

## 2023-10-15 LAB — BASIC METABOLIC PANEL
Anion gap: 11 (ref 5–15)
BUN: <5 mg/dL — ABNORMAL LOW (ref 6–20)
CO2: 21 mmol/L — ABNORMAL LOW (ref 22–32)
Calcium: 8.4 mg/dL — ABNORMAL LOW (ref 8.9–10.3)
Chloride: 107 mmol/L (ref 98–111)
Creatinine, Ser: 0.42 mg/dL — ABNORMAL LOW (ref 0.61–1.24)
GFR, Estimated: >60 mL/min (ref >60)
Glucose, Bld: 108 mg/dL — ABNORMAL HIGH (ref 70–99)
Potassium: 3.5 mmol/L (ref 3.5–5.1)
Sodium: 139 mmol/L (ref 135–145)

## 2023-10-15 LAB — RESP PANEL BY RT-PCR (RSV, FLU A&B, COVID)  RVPGX2
Influenza A by PCR: NEGATIVE
Influenza B by PCR: NEGATIVE
Resp Syncytial Virus by PCR: NEGATIVE
SARS Coronavirus 2 by RT PCR: NEGATIVE

## 2023-10-15 LAB — TROPONIN I (HIGH SENSITIVITY)
Troponin I (High Sensitivity): 28 ng/L — ABNORMAL HIGH (ref <18)
Troponin I (High Sensitivity): 22 ng/L — ABNORMAL HIGH (ref <18)

## 2023-10-15 LAB — ETHANOL: Alcohol, Ethyl (B): 358 mg/dL (ref <10)

## 2023-10-15 MED ORDER — ACETAMINOPHEN 325 MG PO TABS
650.0000 mg | ORAL_TABLET | Freq: Once | ORAL | Status: AC
  Administered 2023-10-15: 650 mg via ORAL
  Filled 2023-10-15: qty 2

## 2023-10-15 MED ORDER — IPRATROPIUM-ALBUTEROL 0.5-2.5 (3) MG/3ML IN SOLN
3.0000 mL | Freq: Once | RESPIRATORY_TRACT | Status: AC
  Administered 2023-10-15: 3 mL via RESPIRATORY_TRACT
  Filled 2023-10-15: qty 3

## 2023-10-15 MED ORDER — THIAMINE HCL 100 MG PO TABS
100.0000 mg | ORAL_TABLET | Freq: Every day | ORAL | Status: DC
  Filled 2023-10-15: qty 1

## 2023-10-15 MED ORDER — LORAZEPAM 2 MG PO TABS
0.0000 mg | ORAL_TABLET | Freq: Four times a day (QID) | ORAL | Status: DC
  Administered 2023-10-15: 2 mg via ORAL
  Filled 2023-10-15: qty 1

## 2023-10-15 MED ORDER — HYDROCOD POLI-CHLORPHE POLI ER 10-8 MG/5ML PO SUER
5.0000 mL | Freq: Once | ORAL | Status: AC
  Administered 2023-10-15: 5 mL via ORAL
  Filled 2023-10-15: qty 5

## 2023-10-15 MED ORDER — GUAIFENESIN ER 600 MG PO TB12: 600.0000 mg | ORAL_TABLET | Freq: Two times a day (BID) | ORAL | 0 refills | Status: AC

## 2023-10-15 MED ORDER — ALBUTEROL SULFATE HFA 108 (90 BASE) MCG/ACT IN AERS
2.0000 | INHALATION_SPRAY | Freq: Four times a day (QID) | RESPIRATORY_TRACT | 0 refills | Status: DC | PRN
Start: 2023-10-15 — End: 2024-01-23

## 2023-10-15 NOTE — ED Provider Notes (Signed)
-----------------------------------------   7:02 AM on 10/15/2023 -----------------------------------------  Blood pressure 131/82, pulse 98, temperature 97.9 F (36.6 C), temperature source Oral, resp. rate (!) 21, height 5\' 6"  (1.676 m), SpO2 98%.  Assuming care from Dr. Dolores Frame.  In short, Ethan Wells is a 52 y.o. male with a chief complaint of Nasal Congestion and Cough .  Refer to the original H&P for additional details.  The current plan of care is to discharge once clinically sober.  ----------------------------------------- 7:20 AM on 10/15/2023 ----------------------------------------- On reassessment, patient appears clinically sober with his only complaint being that he is hungry and would like something to eat.  He is appropriate for discharge home with symptomatic treatment for suspected viral URI, was counseled to return to the ED for new or worsening symptoms.  Patient agrees with plan.    Chesley Noon, MD 10/15/23 [phone]

## 2023-10-15 NOTE — Discharge Instructions (Signed)
Drink alcohol only in moderation.  Return to the ER for worsening symptoms, persistent vomiting, difficulty breathing or other concerns. 

## 2023-10-15 NOTE — ED Triage Notes (Signed)
Pt arrived via [facility] from home with c/o coughing up thick mucus x2 weeks and SOB. Pt report he has been smoking Cocaine over last "few" days and consuming unknown amount of alcohol. Pt alert and oriented x4, able to answer all questions asked appropriately.

## 2023-10-15 NOTE — ED Notes (Signed)
Pt given meal tray and drink.

## 2023-10-15 NOTE — ED Provider Notes (Signed)
[facility] Provider Note    Event Date/Time   First MD Initiated Contact with Patient 10/15/23 0255     (approximate)   History   Nasal Congestion and Cough   HPI  Ethan Wells is a 52 y.o. male to the ED via EMS from home with a 2-week history of productive cough and shortness of breath.  Patient admits to cocaine and alcohol use.  Denies fever/chills, chest pain, abdominal pain, nausea, vomiting or dizziness.  Denies active SI/HI/AH/VH.     Past Medical History   Past Medical History:  Diagnosis Date   CAD (coronary artery disease)    a. 01/2011 Anterior STEMI/Cath/PCI: LM nl, LAD 100d (3.5x105mm Vision BMS placed), LCX 38m, RI 50, RCA min irregs, EF 40% w/ apical, inferoapical HK; b. 10/2022 NSTSEMI/Cath: LM nl, LAD 5p/m, RI 85, LCX 182m, OM1 mild dzs, OM3 100 fills via L->L collats from dLAD, RCA 80p, 136m, RPDA fills via collats from LAD. EF 45-50%-->Med Rx.   Cardiac arrest - ventricular fibrillation    a. In setting of STEMI 01/2011   Chronic pain    Cocaine abuse (HCC)    Depression with anxiety    ETOH abuse    a. 6-12 beers/day   GERD (gastroesophageal reflux disease)    GI bleed    Hemorrhoids    Hyperlipidemia    Hypertension    Ischemic cardiomyopathy    a. 06/2011 Echo: EF 45-50%, No rwma; b. 10/2022 Echo: EF 55-60%, no rwma, nl RV fxn.   Marijuana abuse    Migraines    Tobacco abuse    a. 1/2 ppd x 26 yrs     Active Problem List   Patient Active Problem List   Diagnosis Date Noted   Cirrhosis of liver (HCC) 09/03/2023   Acute metabolic encephalopathy 08/11/2023   Acute blood loss anemia (ABLA) 08/08/2023   Elevated liver function tests 07/02/2023   Pancytopenia (HCC) 07/01/2023   Metabolic acidosis 07/01/2023   Liver mass 06/30/2023   Nausea vomiting and diarrhea 06/30/2023   Migraine 06/30/2023   Cholecystitis, acute 05/10/2023   UTI (urinary tract infection) 05/05/2023   Chronic diastolic CHF (congestive heart  failure) (HCC) 05/05/2023   Alcohol abuse 05/05/2023   Alcoholic cirrhosis (HCC) 04/12/2023   Esophageal varices with bleeding in diseases classified elsewhere (HCC) 04/09/2023   Hepatic encephalopathy (HCC) 04/09/2023   Secondary esophageal varices with bleeding (HCC) 04/08/2023   Upper GI bleeding 04/07/2023   Leg edema 03/01/2023   GI bleeding 02/22/2023   HLD (hyperlipidemia) 02/22/2023   Adrenal insufficiency (HCC) 02/22/2023   Alcoholic liver disease (HCC) 02/22/2023   Upper GI bleed 02/22/2023   Myocardial injury 01/29/2023   Adrenal insufficiency (Addison's disease) (HCC) 01/28/2023   Reactive thrombocytosis 01/24/2023   Hypophosphatemia 01/22/2023   Acute hypoxic respiratory failure (HCC) 01/20/2023   Orthostatic hypotension 01/17/2023   Anasarca 01/17/2023   Hemorrhagic shock (HCC) 01/17/2023   Obesity (BMI 30-39.9) 01/17/2023   Cocaine dependence with cocaine-induced disorder (HCC) 01/17/2023   Rectal bleeding 01/17/2023   Peripheral edema 01/17/2023   History of benzodiazepine use 01/17/2023   Chest pain 11/19/2022   Acute on chronic heart failure with preserved ejection fraction (HFpEF) (HCC) 10/29/2022   Acute respiratory distress 10/29/2022   Elevated rheumatoid factor 10/28/2022   Hyperkalemia 10/25/2022   Hemorrhoids, internal, with bleeding 10/25/2022   Mesenteric artery stenosis (HCC) 10/23/2022   Iron deficiency anemia 10/23/2022   Hematochezia    Abnormal CT scan, colon  Colitis 09/30/2022   Prolonged QT interval 09/30/2022   Alcohol use disorder 08/19/2022   GI bleed 05/28/2022   Non-intractable vomiting    Epigastric pain    Polyp of ascending colon    Anemia due to GI blood loss 08/07/2021   Acute gastritis    Hyperbilirubinemia    Elevated troponin    Notalgia    History of ST elevation myocardial infarction (STEMI) 12/19/2020   Alcohol dependence (HCC) 12/19/2020   Depression with anxiety 10/05/2020   Esophageal reflux 10/05/2020    Headache 10/05/2020   Hyperlipidemia 10/05/2020   Hyponatremia 06/04/2020   Hypomagnesemia 06/04/2020   Chronic systolic CHF (congestive heart failure) (HCC) 06/04/2020   Leukocytosis 06/04/2020   Abdominal pain 06/04/2020   Hypokalemia 06/04/2020   Ischemic cardiomyopathy    Polysubstance abuse (HCC)    NSTEMI (non-ST elevated myocardial infarction) (HCC) 07/12/2019   Abdominal pain, RUQ 05/25/2018   Chest pain, mid sternal 11/17/2012   Cocaine abuse (HCC)    Marijuana abuse    Tobacco abuse 02/26/2011   CAD (coronary artery disease) 02/26/2011   Other specified forms of chronic ischemic heart disease 02/26/2011     Past Surgical History   Past Surgical History:  Procedure Laterality Date   COLONOSCOPY  08/09/2021   Procedure: COLONOSCOPY;  Surgeon: Midge Minium, MD;  Location: [facility] ENDOSCOPY;  Service: Endoscopy;;   CORONARY ANGIOPLASTY WITH STENT PLACEMENT     ESOPHAGEAL BANDING  04/08/2023   Procedure: ESOPHAGEAL BANDING;  Surgeon: Tressia Danas, MD;  Location: [facility] ENDOSCOPY;  Service: Gastroenterology;;   ESOPHAGOGASTRODUODENOSCOPY N/A 05/30/2022   Procedure: ESOPHAGOGASTRODUODENOSCOPY (EGD);  Surgeon: Toney Reil, MD;  Location: [facility] ENDOSCOPY;  Service: Gastroenterology;  Laterality: N/A;   ESOPHAGOGASTRODUODENOSCOPY (EGD) WITH PROPOFOL N/A 03/01/2021   Procedure: ESOPHAGOGASTRODUODENOSCOPY (EGD) WITH PROPOFOL;  Surgeon: Toney Reil, MD;  Location: [facility];  Service: Endoscopy;  Laterality: N/A;   ESOPHAGOGASTRODUODENOSCOPY (EGD) WITH PROPOFOL N/A 08/09/2021   Procedure: ESOPHAGOGASTRODUODENOSCOPY (EGD) WITH PROPOFOL;  Surgeon: Midge Minium, MD;  Location: [facility] ENDOSCOPY;  Service: Endoscopy;  Laterality: N/A;   ESOPHAGOGASTRODUODENOSCOPY (EGD) WITH PROPOFOL N/A 03/01/2023   Procedure: ESOPHAGOGASTRODUODENOSCOPY (EGD) WITH PROPOFOL;  Surgeon: Jaynie Collins, DO;  Location: [facility] ENDOSCOPY;  Service: Gastroenterology;  Laterality: N/A;    ESOPHAGOGASTRODUODENOSCOPY (EGD) WITH PROPOFOL N/A 04/08/2023   Procedure: ESOPHAGOGASTRODUODENOSCOPY (EGD) WITH PROPOFOL;  Surgeon: Tressia Danas, MD;  Location: [facility] ENDOSCOPY;  Service: Gastroenterology;  Laterality: N/A;   ESOPHAGOGASTRODUODENOSCOPY (EGD) WITH PROPOFOL N/A 08/09/2023   Procedure: ESOPHAGOGASTRODUODENOSCOPY (EGD) WITH PROPOFOL;  Surgeon: Regis Bill, MD;  Location: [facility] ENDOSCOPY;  Service: Endoscopy;  Laterality: N/A;   ESOPHAGOGASTRODUODENOSCOPY (EGD) WITH PROPOFOL N/A 08/19/2023   Procedure: ESOPHAGOGASTRODUODENOSCOPY (EGD) WITH PROPOFOL;  Surgeon: Regis Bill, MD;  Location: [facility] ENDOSCOPY;  Service: Endoscopy;  Laterality: N/A;   EVALUATION UNDER ANESTHESIA WITH HEMORRHOIDECTOMY N/A 10/31/2022   Procedure: EXAM UNDER ANESTHESIA WITH SUTURE LIGATION OF TWO BLEEDING PEDICLES;  Surgeon: Henrene Dodge, MD;  Location: [facility] ORS;  Service: General;  Laterality: N/A;   FLEXIBLE SIGMOIDOSCOPY N/A 10/02/2022   Procedure: FLEXIBLE SIGMOIDOSCOPY;  Surgeon: Meryl Dare, MD;  Location: [facility] ENDOSCOPY;  Service: Gastroenterology;  Laterality: N/A;   HEMOSTASIS CONTROL  08/09/2023   Procedure: HEMOSTASIS CONTROL;  Surgeon: Regis Bill, MD;  Location: [facility] ENDOSCOPY;  Service: Endoscopy;;   IR TIPS  04/11/2023   IR US GUIDE VASC ACCESS RIGHT  04/11/2023   IR US GUIDE VASC ACCESS RIGHT  04/11/2023   IR US GUIDE VASC ACCESS RIGHT  04/11/2023   LEFT HEART CATH AND CORONARY ANGIOGRAPHY N/A 11/04/2022   Procedure: LEFT HEART CATH AND CORONARY ANGIOGRAPHY;  Surgeon: Iran Ouch, MD;  Location: [facility] INVASIVE CV LAB;  Service: Cardiovascular;  Laterality: N/A;   RADIOLOGY WITH ANESTHESIA N/A 04/11/2023   Procedure: IR WITH ANESTHESIA;  Surgeon: Oley Balm, MD;  Location: [facility] OR;  Service: Radiology;  Laterality: N/A;   VISCERAL ANGIOGRAPHY N/A 10/24/2022   Procedure: VISCERAL ANGIOGRAPHY;  Surgeon: Annice Needy, MD;  Location: [facility] INVASIVE CV LAB;  Service:  Cardiovascular;  Laterality: N/A;   VISCERAL ARTERY INTERVENTION N/A 05/09/2023   Procedure: VISCERAL ARTERY INTERVENTION;  Surgeon: Renford Dills, MD;  Location: [facility] INVASIVE CV LAB;  Service: Cardiovascular;  Laterality: N/A;     Home Medications   Prior to Admission medications   Medication Sig Start Date End Date Taking? Authorizing Provider  docusate sodium (COLACE) 100 MG capsule Take 1 capsule (100 mg total) by mouth 2 (two) times daily as needed (if less than 3 BM"s in past 24 hours). 08/25/23   Pennie Banter, DO  feeding supplement (ENSURE ENLIVE / ENSURE PLUS) LIQD Take 237 mLs by mouth 2 (two) times daily between meals. 08/25/23   Pennie Banter, DO  furosemide (LASIX) 40 MG tablet Take 1 tablet (40 mg total) by mouth daily. 09/08/23 11/07/23  Tresa Moore, MD  gabapentin (NEURONTIN) 100 MG capsule Take 1 capsule (100 mg total) by mouth 2 (two) times daily. 09/08/23 10/08/23  Tresa Moore, MD  hydrocortisone (CORTEF) 5 MG tablet Take 1 tablet (5 mg total) by mouth 2 (two) times daily. 08/25/23   Pennie Banter, DO  lactulose (CHRONULAC) 10 GM/15ML solution Take 45 mLs (30 g total) by mouth 3 (three) times daily. 08/25/23   Pennie Banter, DO  methocarbamol (ROBAXIN) 750 MG tablet Take 1 tablet (750 mg total) by mouth 2 (two) times daily as needed for muscle spasms. 09/08/23   Tresa Moore, MD  midodrine (PROAMATINE) 10 MG tablet Take 1 tablet (10 mg total) by mouth 3 (three) times daily with meals. 08/25/23   Esaw Grandchild A, DO  oxyCODONE (OXY IR/ROXICODONE) 5 MG immediate release tablet Take 1 tablet (5 mg total) by mouth every 8 (eight) hours as needed for severe pain. 09/08/23   Tresa Moore, MD  polyethylene glycol powder (GLYCOLAX/MIRALAX) 17 GM/SCOOP powder Take 17 g by mouth daily as needed (if less than 3 BM"s in past 24 hours). 08/25/23   Esaw Grandchild A, DO  potassium chloride SA (KLOR-CON M) 20 MEQ tablet Take 1 tablet (20 mEq total) by  mouth daily. 08/26/23   Pennie Banter, DO  QUEtiapine (SEROQUEL) 50 MG tablet Take 1 tablet (50 mg total) by mouth at bedtime. 09/08/23 10/08/23  Tresa Moore, MD  rosuvastatin (CRESTOR) 40 MG tablet TAKE 1 TABLET BY MOUTH EVERY DAY 07/28/23   Medina-Vargas, Monina C, NP     Allergies  Penicillins   Family History   Family History  Problem Relation Age of Onset   Coronary artery disease Mother        alive   Other Other        2 sisters alive and well   Alcohol abuse Father        died @ 66   Other Paternal Grandmother        PACER     Physical Exam  Triage Vital Signs: ED Triage Vitals  Encounter Vitals Group  BP 10/15/23 0259 131/82     Systolic BP Percentile --      Diastolic BP Percentile --      Pulse Rate 10/15/23 0259 92     Resp 10/15/23 0259 (!) 21     Temp 10/15/23 0259 97.9 F (36.6 C)     Temp Source 10/15/23 0259 Oral     SpO2 10/15/23 0259 100 %     Weight --      Height 10/15/23 0258 5\' 6"  (1.676 m)     Head Circumference --      Peak Flow --      Pain Score 10/15/23 0258 9     Pain Loc --      Pain Education --      Exclude from Growth Chart --     Updated Vital Signs: BP 131/82   Pulse 98   Temp 97.9 F (36.6 C) (Oral)   Resp (!) 21   Ht 5\' 6"  (1.676 m)   SpO2 98%   BMI 32.74 kg/m    General: Awake, no distress.  CV:  RRR.  Good peripheral perfusion.  Resp:  Normal effort.  Diminished aeration, otherwise CTAB.  Active dry cough noted. Abd:  Nontender.  No distention.  Other:  Tearful.  Intoxicated.   ED Results / Procedures / Treatments  Labs (all labs ordered are listed, but only abnormal results are displayed) Labs Reviewed  BASIC METABOLIC PANEL - Abnormal; Notable for the following components:      Result Value   CO2 21 (*)    Glucose, Bld 108 (*)    BUN <5 (*)    Creatinine, Ser 0.42 (*)    Calcium 8.4 (*)    All other components within normal limits  CBC - Abnormal; Notable for the following components:    RBC 4.03 (*)    Hemoglobin 11.3 (*)    HCT 35.0 (*)    RDW 18.1 (*)    Platelets 109 (*)    All other components within normal limits  ETHANOL - Abnormal; Notable for the following components:   Alcohol, Ethyl (B) 358 (*)    All other components within normal limits  TROPONIN I (HIGH SENSITIVITY) - Abnormal; Notable for the following components:   Troponin I (High Sensitivity) 28 (*)    All other components within normal limits  TROPONIN I (HIGH SENSITIVITY) - Abnormal; Notable for the following components:   Troponin I (High Sensitivity) 22 (*)    All other components within normal limits  RESP PANEL BY RT-PCR (RSV, FLU A&B, COVID)  [id number]     EKG  ED ECG REPORT I, Kairi Harshbarger J, the attending physician, personally viewed and interpreted this ECG.   Date: 10/15/2023  EKG Time: 0258  Rate: 89  Rhythm: normal sinus rhythm  Axis: Normal  Intervals:none  ST&T Change: Nonspecific    RADIOLOGY I have independently visualized and interpreted patient's imaging study as well as noted the radiology interpretation:  Chest x-ray: No acute cardiopulmonary process  Official radiology report(s): DG Chest 2 View  Result Date: 10/15/2023 CLINICAL DATA:  Shortness of breath and productive cough EXAM: CHEST - 2 VIEW COMPARISON:  09/03/2023 FINDINGS: Stable heart size and mediastinal contours with coronary stent seen on the lateral view. There is no edema, consolidation, effusion, or pneumothorax. IMPRESSION: No visible pneumonia. Electronically Signed   By: Tiburcio Pea M.D.   On: 10/15/2023 05:08     PROCEDURES:  Critical Care performed: No  Procedures  MEDICATIONS ORDERED IN ED: Medications  LORazepam (ATIVAN) tablet 0-4 mg (2 mg Oral Given 10/15/23 0401)  thiamine (VITAMIN B1) tablet 100 mg (has no administration in time range)  ipratropium-albuterol (DUONEB) 0.5-2.5 (3) MG/3ML nebulizer solution 3 mL (3 mLs Nebulization Given 10/15/23 0443)   chlorpheniramine-HYDROcodone (TUSSIONEX) 10-8 MG/5ML suspension 5 mL (5 mLs Oral Given 10/15/23 0443)  acetaminophen (TYLENOL) tablet 650 mg (650 mg Oral Given 10/15/23 0651)     IMPRESSION / MDM / ASSESSMENT AND PLAN / ED COURSE  I reviewed the triage vital signs and the nursing notes.                             52 year old male presenting with shortness of breath, alcohol and cocaine use. Differential includes, but is not limited to, viral syndrome, bronchitis including COPD exacerbation, pneumonia, reactive airway disease including asthma, CHF including exacerbation with or without pulmonary/interstitial edema, pneumothorax, ACS, thoracic trauma, and pulmonary embolism.  I personally reviewed patient's records and note a hospitalization for CHF, alcoholic cirrhosis with ascites approximately 6 weeks ago.  Patient's presentation is most consistent with acute presentation with potential threat to life or bodily function.  Will obtain lab work, chest x-ray.  Administer DuoNeb, Tussionex for cough and reassess.  Clinical Course as of 10/15/23 0705  Wed Oct 15, 2023  9147 Repeat troponin decreasing.  Patient improved after DuoNeb and Tussionex.  Will monitor until sobriety.  Anticipate discharge home at that time.  Care will be transferred to the oncoming provider at change of shift. [JS]    Clinical Course User Index [JS] Irean Hong, MD     FINAL CLINICAL IMPRESSION(S) / ED DIAGNOSES   Final diagnoses:  Alcoholic intoxication without complication (HCC)  Bronchitis     Rx / DC Orders   ED Discharge Orders     None        Note:  This document was prepared using Dragon voice recognition software and may include unintentional dictation errors.   Irean Hong, MD 10/15/23 [id number]

## 2023-10-15 NOTE — ED Notes (Signed)
Given sandwich tray and juice as requested

## 2023-10-15 NOTE — ED Notes (Signed)
ED Provider at bedside. 

## 2023-10-16 ENCOUNTER — Inpatient Hospital Stay: Payer: Medicare HMO

## 2023-10-16 ENCOUNTER — Encounter: Payer: Self-pay | Admitting: Emergency Medicine

## 2023-10-16 ENCOUNTER — Inpatient Hospital Stay
Admission: EM | Admit: 2023-10-16 | Discharge: 2023-10-18 | DRG: 191 | Disposition: A | Discharge status: Home / Self Care | Payer: Medicare HMO | Attending: Osteopathic Medicine | Admitting: Osteopathic Medicine

## 2023-10-16 ENCOUNTER — Emergency Department: Payer: Medicare HMO

## 2023-10-16 DIAGNOSIS — I252 Old myocardial infarction: Secondary | ICD-10-CM | POA: Diagnosis not present

## 2023-10-16 DIAGNOSIS — Z1152 Encounter for screening for COVID-19: Secondary | ICD-10-CM

## 2023-10-16 DIAGNOSIS — E669 Obesity, unspecified: Secondary | ICD-10-CM | POA: Diagnosis not present

## 2023-10-16 DIAGNOSIS — F1090 Alcohol use, unspecified, uncomplicated: Secondary | ICD-10-CM | POA: Diagnosis present

## 2023-10-16 DIAGNOSIS — Z955 Presence of coronary angioplasty implant and graft: Secondary | ICD-10-CM

## 2023-10-16 DIAGNOSIS — K7460 Unspecified cirrhosis of liver: Secondary | ICD-10-CM | POA: Diagnosis present

## 2023-10-16 DIAGNOSIS — K219 Gastro-esophageal reflux disease without esophagitis: Secondary | ICD-10-CM | POA: Diagnosis present

## 2023-10-16 DIAGNOSIS — I951 Orthostatic hypotension: Secondary | ICD-10-CM | POA: Diagnosis present

## 2023-10-16 DIAGNOSIS — Z6832 Body mass index (BMI) 32.0-32.9, adult: Secondary | ICD-10-CM

## 2023-10-16 DIAGNOSIS — I110 Hypertensive heart disease with heart failure: Secondary | ICD-10-CM | POA: Diagnosis not present

## 2023-10-16 DIAGNOSIS — I25119 Atherosclerotic heart disease of native coronary artery with unspecified angina pectoris: Secondary | ICD-10-CM | POA: Diagnosis present

## 2023-10-16 DIAGNOSIS — F17210 Nicotine dependence, cigarettes, uncomplicated: Secondary | ICD-10-CM | POA: Diagnosis present

## 2023-10-16 DIAGNOSIS — Z8249 Family history of ischemic heart disease and other diseases of the circulatory system: Secondary | ICD-10-CM | POA: Diagnosis not present

## 2023-10-16 DIAGNOSIS — E2740 Unspecified adrenocortical insufficiency: Secondary | ICD-10-CM | POA: Diagnosis not present

## 2023-10-16 DIAGNOSIS — F10120 Alcohol abuse with intoxication, uncomplicated: Secondary | ICD-10-CM | POA: Diagnosis present

## 2023-10-16 DIAGNOSIS — R0989 Other specified symptoms and signs involving the circulatory and respiratory systems: Secondary | ICD-10-CM | POA: Diagnosis not present

## 2023-10-16 DIAGNOSIS — J209 Acute bronchitis, unspecified: Secondary | ICD-10-CM | POA: Diagnosis not present

## 2023-10-16 DIAGNOSIS — F1490 Cocaine use, unspecified, uncomplicated: Secondary | ICD-10-CM | POA: Diagnosis present

## 2023-10-16 DIAGNOSIS — I5022 Chronic systolic (congestive) heart failure: Secondary | ICD-10-CM | POA: Diagnosis present

## 2023-10-16 DIAGNOSIS — R000 Tachycardia, unspecified: Secondary | ICD-10-CM | POA: Diagnosis not present

## 2023-10-16 DIAGNOSIS — Y908 Blood alcohol level of 240 mg/100 ml or more: Secondary | ICD-10-CM | POA: Diagnosis present

## 2023-10-16 DIAGNOSIS — R7989 Other specified abnormal findings of blood chemistry: Secondary | ICD-10-CM | POA: Diagnosis present

## 2023-10-16 DIAGNOSIS — Z720 Tobacco use: Secondary | ICD-10-CM | POA: Diagnosis present

## 2023-10-16 DIAGNOSIS — F10129 Alcohol abuse with intoxication, unspecified: Secondary | ICD-10-CM | POA: Diagnosis not present

## 2023-10-16 DIAGNOSIS — I2510 Atherosclerotic heart disease of native coronary artery without angina pectoris: Secondary | ICD-10-CM | POA: Diagnosis not present

## 2023-10-16 DIAGNOSIS — I517 Cardiomegaly: Secondary | ICD-10-CM | POA: Diagnosis not present

## 2023-10-16 DIAGNOSIS — I255 Ischemic cardiomyopathy: Secondary | ICD-10-CM | POA: Diagnosis present

## 2023-10-16 DIAGNOSIS — Z880 Allergy status to penicillin: Secondary | ICD-10-CM

## 2023-10-16 DIAGNOSIS — E876 Hypokalemia: Secondary | ICD-10-CM | POA: Diagnosis present

## 2023-10-16 DIAGNOSIS — Z79899 Other long term (current) drug therapy: Secondary | ICD-10-CM

## 2023-10-16 DIAGNOSIS — E785 Hyperlipidemia, unspecified: Secondary | ICD-10-CM | POA: Diagnosis present

## 2023-10-16 DIAGNOSIS — K766 Portal hypertension: Secondary | ICD-10-CM | POA: Diagnosis not present

## 2023-10-16 DIAGNOSIS — F10920 Alcohol use, unspecified with intoxication, uncomplicated: Secondary | ICD-10-CM

## 2023-10-16 DIAGNOSIS — R778 Other specified abnormalities of plasma proteins: Secondary | ICD-10-CM | POA: Diagnosis not present

## 2023-10-16 DIAGNOSIS — Z811 Family history of alcohol abuse and dependence: Secondary | ICD-10-CM

## 2023-10-16 DIAGNOSIS — J441 Chronic obstructive pulmonary disease with (acute) exacerbation: Secondary | ICD-10-CM | POA: Diagnosis not present

## 2023-10-16 DIAGNOSIS — K5900 Constipation, unspecified: Secondary | ICD-10-CM | POA: Diagnosis present

## 2023-10-16 DIAGNOSIS — I700 Atherosclerosis of aorta: Secondary | ICD-10-CM | POA: Diagnosis not present

## 2023-10-16 DIAGNOSIS — G629 Polyneuropathy, unspecified: Secondary | ICD-10-CM

## 2023-10-16 DIAGNOSIS — R9431 Abnormal electrocardiogram [ECG] [EKG]: Secondary | ICD-10-CM | POA: Diagnosis not present

## 2023-10-16 DIAGNOSIS — D6959 Other secondary thrombocytopenia: Secondary | ICD-10-CM | POA: Diagnosis present

## 2023-10-16 DIAGNOSIS — Z8674 Personal history of sudden cardiac arrest: Secondary | ICD-10-CM

## 2023-10-16 DIAGNOSIS — F1910 Other psychoactive substance abuse, uncomplicated: Secondary | ICD-10-CM | POA: Diagnosis present

## 2023-10-16 DIAGNOSIS — E782 Mixed hyperlipidemia: Secondary | ICD-10-CM

## 2023-10-16 DIAGNOSIS — R0602 Shortness of breath: Secondary | ICD-10-CM | POA: Diagnosis not present

## 2023-10-16 LAB — CBC WITH DIFFERENTIAL/PLATELET
Abs Immature Granulocytes: 0.02 10*3/uL (ref 0.00–0.07)
Basophils Absolute: 0.1 10*3/uL (ref 0.0–0.1)
Basophils Relative: 1 %
Eosinophils Absolute: 0.1 10*3/uL (ref 0.0–0.5)
Eosinophils Relative: 1 %
HCT: 30.6 % — ABNORMAL LOW (ref 39.0–52.0)
Hemoglobin: 10.1 g/dL — ABNORMAL LOW (ref 13.0–17.0)
Immature Granulocytes: 0 %
Lymphocytes Relative: 29 %
Lymphs Abs: 1.6 10*3/uL (ref 0.7–4.0)
MCH: 28.4 pg (ref 26.0–34.0)
MCHC: 33 g/dL (ref 30.0–36.0)
MCV: 86 fL (ref 80.0–100.0)
Monocytes Absolute: 0.7 10*3/uL (ref 0.1–1.0)
Monocytes Relative: 12 %
Neutro Abs: 3.2 10*3/uL (ref 1.7–7.7)
Neutrophils Relative %: 57 %
Platelets: 97 10*3/uL — ABNORMAL LOW (ref 150–400)
RBC: 3.56 MIL/uL — ABNORMAL LOW (ref 4.22–5.81)
RDW: 18.2 % — ABNORMAL HIGH (ref 11.5–15.5)
WBC: 5.6 10*3/uL (ref 4.0–10.5)
nRBC: 0 % (ref 0.0–0.2)

## 2023-10-16 LAB — RESPIRATORY PANEL BY PCR

## 2023-10-16 LAB — TROPONIN I (HIGH SENSITIVITY)
Troponin I (High Sensitivity): 19 ng/L — ABNORMAL HIGH (ref <18)
Troponin I (High Sensitivity): 18 ng/L — ABNORMAL HIGH (ref <18)

## 2023-10-16 LAB — BASIC METABOLIC PANEL
Anion gap: 11 (ref 5–15)
BUN: 6 mg/dL (ref 6–20)
CO2: 21 mmol/L — ABNORMAL LOW (ref 22–32)
Calcium: 8.4 mg/dL — ABNORMAL LOW (ref 8.9–10.3)
Chloride: 107 mmol/L (ref 98–111)
Creatinine, Ser: 0.48 mg/dL — ABNORMAL LOW (ref 0.61–1.24)
GFR, Estimated: >60 mL/min (ref >60)
Glucose, Bld: 101 mg/dL — ABNORMAL HIGH (ref 70–99)
Potassium: 3.1 mmol/L — ABNORMAL LOW (ref 3.5–5.1)
Sodium: 139 mmol/L (ref 135–145)

## 2023-10-16 LAB — ETHANOL: Alcohol, Ethyl (B): 275 mg/dL — ABNORMAL HIGH (ref <10)

## 2023-10-16 MED ORDER — FOLIC ACID 1 MG PO TABS
1.0000 mg | ORAL_TABLET | Freq: Every day | ORAL | Status: DC
  Administered 2023-10-16 – 2023-10-18 (×3): 1 mg via ORAL
  Filled 2023-10-16 (×3): qty 1

## 2023-10-16 MED ORDER — OXYCODONE HCL 5 MG PO TABS
5.0000 mg | ORAL_TABLET | ORAL | Status: DC | PRN
  Administered 2023-10-16 (×2): 5 mg via ORAL
  Filled 2023-10-16 (×2): qty 1

## 2023-10-16 MED ORDER — QUETIAPINE FUMARATE 25 MG PO TABS
50.0000 mg | ORAL_TABLET | Freq: Every day | ORAL | Status: DC
  Administered 2023-10-16 – 2023-10-17 (×2): 50 mg via ORAL
  Filled 2023-10-16 (×2): qty 2

## 2023-10-16 MED ORDER — ALBUTEROL SULFATE (2.5 MG/3ML) 0.083% IN NEBU
2.5000 mg | INHALATION_SOLUTION | RESPIRATORY_TRACT | Status: DC | PRN
  Administered 2023-10-17: 2.5 mg via RESPIRATORY_TRACT
  Filled 2023-10-16: qty 3

## 2023-10-16 MED ORDER — NICOTINE POLACRILEX 2 MG MT GUM: 2.0000 mg | CHEWING_GUM | OROMUCOSAL | Status: DC | PRN

## 2023-10-16 MED ORDER — MIDODRINE HCL 5 MG PO TABS
5.0000 mg | ORAL_TABLET | Freq: Three times a day (TID) | ORAL | Status: DC
  Administered 2023-10-17 – 2023-10-18 (×5): 5 mg via ORAL
  Filled 2023-10-16 (×5): qty 1

## 2023-10-16 MED ORDER — HYDROCORTISONE 5 MG PO TABS
5.0000 mg | ORAL_TABLET | Freq: Two times a day (BID) | ORAL | Status: DC
  Administered 2023-10-16 – 2023-10-18 (×4): 5 mg via ORAL
  Filled 2023-10-16 (×5): qty 1

## 2023-10-16 MED ORDER — OXYCODONE HCL 5 MG PO TABS
5.0000 mg | ORAL_TABLET | ORAL | Status: DC | PRN
  Administered 2023-10-16: 10 mg via ORAL
  Administered 2023-10-16: 5 mg via ORAL
  Administered 2023-10-17: 10 mg via ORAL
  Filled 2023-10-16 (×3): qty 2

## 2023-10-16 MED ORDER — NICOTINE 21 MG/24HR TD PT24
21.0000 mg | MEDICATED_PATCH | Freq: Every day | TRANSDERMAL | Status: DC
  Administered 2023-10-16 – 2023-10-18 (×3): 21 mg via TRANSDERMAL
  Filled 2023-10-16 (×3): qty 1

## 2023-10-16 MED ORDER — FLUTICASONE FUROATE-VILANTEROL 100-25 MCG/ACT IN AEPB
1.0000 | INHALATION_SPRAY | Freq: Every day | RESPIRATORY_TRACT | Status: DC
  Administered 2023-10-17 – 2023-10-18 (×2): 1 via RESPIRATORY_TRACT
  Filled 2023-10-16 (×2): qty 28

## 2023-10-16 MED ORDER — LACTULOSE 10 GM/15ML PO SOLN
30.0000 g | Freq: Three times a day (TID) | ORAL | Status: DC
  Administered 2023-10-16 – 2023-10-17 (×4): 30 g via ORAL
  Filled 2023-10-16 (×5): qty 60

## 2023-10-16 MED ORDER — IPRATROPIUM-ALBUTEROL 0.5-2.5 (3) MG/3ML IN SOLN
3.0000 mL | Freq: Once | RESPIRATORY_TRACT | Status: AC
  Administered 2023-10-16: 3 mL via RESPIRATORY_TRACT
  Filled 2023-10-16: qty 3

## 2023-10-16 MED ORDER — LORAZEPAM 2 MG/ML IJ SOLN: 1.0000 mg | INTRAMUSCULAR | Status: DC | PRN

## 2023-10-16 MED ORDER — IOHEXOL 350 MG/ML SOLN
75.0000 mL | Freq: Once | INTRAVENOUS | Status: AC | PRN
  Administered 2023-10-16: 75 mL via INTRAVENOUS

## 2023-10-16 MED ORDER — IPRATROPIUM-ALBUTEROL 0.5-2.5 (3) MG/3ML IN SOLN
3.0000 mL | Freq: Four times a day (QID) | RESPIRATORY_TRACT | Status: DC
  Administered 2023-10-16 (×3): 3 mL via RESPIRATORY_TRACT
  Filled 2023-10-16 (×3): qty 3

## 2023-10-16 MED ORDER — SALINE SPRAY 0.65 % NA SOLN: 1.0000 | NASAL | Status: DC | PRN

## 2023-10-16 MED ORDER — ROSUVASTATIN CALCIUM 10 MG PO TABS
40.0000 mg | ORAL_TABLET | Freq: Every day | ORAL | Status: DC
  Administered 2023-10-17 – 2023-10-18 (×2): 40 mg via ORAL
  Filled 2023-10-16 (×2): qty 4

## 2023-10-16 MED ORDER — ACETAMINOPHEN 500 MG PO TABS: 500.0000 mg | ORAL_TABLET | Freq: Four times a day (QID) | ORAL | Status: DC | PRN

## 2023-10-16 MED ORDER — POLYETHYLENE GLYCOL 3350 17 G PO PACK: 17.0000 g | PACK | Freq: Every day | ORAL | Status: DC | PRN

## 2023-10-16 MED ORDER — METHYLPREDNISOLONE SODIUM SUCC 125 MG IJ SOLR
125.0000 mg | Freq: Once | INTRAMUSCULAR | Status: AC
  Administered 2023-10-16: 125 mg via INTRAVENOUS
  Filled 2023-10-16: qty 2

## 2023-10-16 MED ORDER — LORAZEPAM 1 MG PO TABS
1.0000 mg | ORAL_TABLET | ORAL | Status: DC | PRN
  Administered 2023-10-16 (×4): 1 mg via ORAL
  Filled 2023-10-16 (×4): qty 1

## 2023-10-16 MED ORDER — GUAIFENESIN ER 600 MG PO TB12
600.0000 mg | ORAL_TABLET | Freq: Two times a day (BID) | ORAL | Status: DC
  Administered 2023-10-16 – 2023-10-18 (×5): 600 mg via ORAL
  Filled 2023-10-16 (×5): qty 1

## 2023-10-16 MED ORDER — LORAZEPAM 2 MG PO TABS
0.0000 mg | ORAL_TABLET | Freq: Four times a day (QID) | ORAL | Status: DC
Start: 2023-10-16 — End: 2023-10-16
  Administered 2023-10-16: 1 mg via ORAL
  Filled 2023-10-16: qty 1

## 2023-10-16 MED ORDER — POLYETHYLENE GLYCOL 3350 17 GM/SCOOP PO POWD: 17.0000 g | Freq: Every day | ORAL | Status: DC | PRN

## 2023-10-16 MED ORDER — GABAPENTIN 100 MG PO CAPS
100.0000 mg | ORAL_CAPSULE | Freq: Two times a day (BID) | ORAL | Status: DC
  Administered 2023-10-16 – 2023-10-18 (×5): 100 mg via ORAL
  Filled 2023-10-16 (×5): qty 1

## 2023-10-16 MED ORDER — METHOCARBAMOL 500 MG PO TABS
750.0000 mg | ORAL_TABLET | Freq: Two times a day (BID) | ORAL | Status: DC | PRN
  Administered 2023-10-17 – 2023-10-18 (×2): 750 mg via ORAL
  Filled 2023-10-16 (×2): qty 2

## 2023-10-16 MED ORDER — MIDODRINE HCL 5 MG PO TABS: 10.0000 mg | ORAL_TABLET | Freq: Three times a day (TID) | ORAL | Status: DC

## 2023-10-16 MED ORDER — THIAMINE HCL 100 MG PO TABS
100.0000 mg | ORAL_TABLET | Freq: Every day | ORAL | Status: DC
  Administered 2023-10-16 – 2023-10-18 (×3): 100 mg via ORAL
  Filled 2023-10-16 (×6): qty 1

## 2023-10-16 MED ORDER — POTASSIUM CHLORIDE CRYS ER 20 MEQ PO TBCR
40.0000 meq | EXTENDED_RELEASE_TABLET | Freq: Once | ORAL | Status: AC
  Administered 2023-10-16: 40 meq via ORAL
  Filled 2023-10-16: qty 2

## 2023-10-16 MED ORDER — FUROSEMIDE 40 MG PO TABS
40.0000 mg | ORAL_TABLET | Freq: Every day | ORAL | Status: DC
  Administered 2023-10-16 – 2023-10-17 (×2): 40 mg via ORAL
  Filled 2023-10-16 (×2): qty 1

## 2023-10-16 NOTE — ED Notes (Signed)
MD messaged about pts pain score

## 2023-10-16 NOTE — H&P (Addendum)
History and Physical    [name]:[id number] DOB: [birth year] DOA: 10/16/2023 DOS: the patient was seen and examined on 10/16/2023 PCP: [name], NP  Patient coming from: Home  Chief Complaint:  Chief Complaint  Patient presents with   Shortness of Breath   HPI: Ethan Wells is a 52 y.o. male with medical history significant of liver cirrhosis s/p TIPS, superior mesenteric artery stenosis s/p stenting, CAD, STEMI s/p PCI with stenting, HFpEF, ischemic cardiomyopathy, prior V-fib cardiac arrest, hypertension, hyperlipidemia, GERD, migraine headaches polysubstance abuse (cocaine, alcohol, marijuana and tobacco), orthostatic hypotension, Addison's/adrenal insufficiency, chronic narcotic dependence, and recurrent GI bleeds.  He has had 2 ED visits in 24 hours. Yesterday morning he presented with a 2-week history of productive cough with sputum that he describes as black in color, shortness of breath, nasal congestion, and was noted to be intoxicated.  He was given a DuoNeb and Tussionex with improvement in symptoms, he was monitored until clinically sober and discharged home.  He re-presents to the emergency department this morning with shortness of breath. He is also reporting midepigastric pain that radiates to his back. Denies unilateral or bilateral lower extremity swelling, no calf tenderness on exam.  He denies fevers, chills, abdominal pain, nausea, or vomiting.  ED Course: On arrival to the ED he was noted to be afebrile temp 36.6C, BP 131/82,HR 83, RR 21, SpO2 100% on room air.  He was noted to have increased respiratory effort with audible wheezing and given IV Solu-Medrol and DuoNeb with improvement in symptoms. He still reports subjective dyspnea.  CXR obtained and shows mild vascular congestion.  Labs notable for for troponin slightly elevated at 28 with repeat 22, ethanol level 275, hemoglobin 10.1, platelets 97, potassium 3.1, CO2 21, glucose  mildly elevated at 101.  Potassium repleted in the ED with 40 mEq p.o.  TRH contacted for admission.   Review of Systems: As mentioned in the history of present illness. All other systems reviewed and are negative. Past Medical History:  Diagnosis Date   CAD (coronary artery disease)    a. 01/2011 Anterior STEMI/Cath/PCI: LM nl, LAD 100d (3.5x63mm Vision BMS placed), LCX 23m, RI 50, RCA min irregs, EF 40% w/ apical, inferoapical HK; b. 10/2022 NSTSEMI/Cath: LM nl, LAD 5p/m, RI 85, LCX 170m, OM1 mild dzs, OM3 100 fills via L->L collats from dLAD, RCA 80p, 161m, RPDA fills via collats from LAD. EF 45-50%-->Med Rx.   Cardiac arrest - ventricular fibrillation    a. In setting of STEMI 01/2011   Chronic pain    Cocaine abuse (HCC)    Depression with anxiety    ETOH abuse    a. 6-12 beers/day   GERD (gastroesophageal reflux disease)    GI bleed    Hemorrhoids    Hyperlipidemia    Hypertension    Ischemic cardiomyopathy    a. 06/2011 Echo: EF 45-50%, No rwma; b. 10/2022 Echo: EF 55-60%, no rwma, nl RV fxn.   Marijuana abuse    Migraines    Tobacco abuse    a. 1/2 ppd x 26 yrs   Past Surgical History:  Procedure Laterality Date   COLONOSCOPY  08/09/2021   Procedure: COLONOSCOPY;  Surgeon: Midge Minium, MD;  Location: [facility] ENDOSCOPY;  Service: Endoscopy;;   CORONARY ANGIOPLASTY WITH STENT PLACEMENT     ESOPHAGEAL BANDING  04/08/2023   Procedure: ESOPHAGEAL BANDING;  Surgeon: Tressia Danas, MD;  Location: [facility] ENDOSCOPY;  Service: Gastroenterology;;   ESOPHAGOGASTRODUODENOSCOPY N/A 05/30/2022  Procedure: ESOPHAGOGASTRODUODENOSCOPY (EGD);  Surgeon: Toney Reil, MD;  Location: [facility] ENDOSCOPY;  Service: Gastroenterology;  Laterality: N/A;   ESOPHAGOGASTRODUODENOSCOPY (EGD) WITH PROPOFOL N/A 03/01/2021   Procedure: ESOPHAGOGASTRODUODENOSCOPY (EGD) WITH PROPOFOL;  Surgeon: Toney Reil, MD;  Location: [facility];  Service: Endoscopy;  Laterality: N/A;    ESOPHAGOGASTRODUODENOSCOPY (EGD) WITH PROPOFOL N/A 08/09/2021   Procedure: ESOPHAGOGASTRODUODENOSCOPY (EGD) WITH PROPOFOL;  Surgeon: Midge Minium, MD;  Location: [facility] ENDOSCOPY;  Service: Endoscopy;  Laterality: N/A;   ESOPHAGOGASTRODUODENOSCOPY (EGD) WITH PROPOFOL N/A 03/01/2023   Procedure: ESOPHAGOGASTRODUODENOSCOPY (EGD) WITH PROPOFOL;  Surgeon: Jaynie Collins, DO;  Location: [facility] ENDOSCOPY;  Service: Gastroenterology;  Laterality: N/A;   ESOPHAGOGASTRODUODENOSCOPY (EGD) WITH PROPOFOL N/A 04/08/2023   Procedure: ESOPHAGOGASTRODUODENOSCOPY (EGD) WITH PROPOFOL;  Surgeon: Tressia Danas, MD;  Location: [facility] ENDOSCOPY;  Service: Gastroenterology;  Laterality: N/A;   ESOPHAGOGASTRODUODENOSCOPY (EGD) WITH PROPOFOL N/A 08/09/2023   Procedure: ESOPHAGOGASTRODUODENOSCOPY (EGD) WITH PROPOFOL;  Surgeon: Regis Bill, MD;  Location: [facility] ENDOSCOPY;  Service: Endoscopy;  Laterality: N/A;   ESOPHAGOGASTRODUODENOSCOPY (EGD) WITH PROPOFOL N/A 08/19/2023   Procedure: ESOPHAGOGASTRODUODENOSCOPY (EGD) WITH PROPOFOL;  Surgeon: Regis Bill, MD;  Location: [facility] ENDOSCOPY;  Service: Endoscopy;  Laterality: N/A;   EVALUATION UNDER ANESTHESIA WITH HEMORRHOIDECTOMY N/A 10/31/2022   Procedure: EXAM UNDER ANESTHESIA WITH SUTURE LIGATION OF TWO BLEEDING PEDICLES;  Surgeon: Henrene Dodge, MD;  Location: [facility] ORS;  Service: General;  Laterality: N/A;   FLEXIBLE SIGMOIDOSCOPY N/A 10/02/2022   Procedure: FLEXIBLE SIGMOIDOSCOPY;  Surgeon: Meryl Dare, MD;  Location: [facility] ENDOSCOPY;  Service: Gastroenterology;  Laterality: N/A;   HEMOSTASIS CONTROL  08/09/2023   Procedure: HEMOSTASIS CONTROL;  Surgeon: Regis Bill, MD;  Location: [facility] ENDOSCOPY;  Service: Endoscopy;;   IR TIPS  04/11/2023   IR US GUIDE VASC ACCESS RIGHT  04/11/2023   IR US GUIDE VASC ACCESS RIGHT  04/11/2023   IR US GUIDE VASC ACCESS RIGHT  04/11/2023   LEFT HEART CATH AND CORONARY ANGIOGRAPHY N/A 11/04/2022   Procedure: LEFT HEART CATH  AND CORONARY ANGIOGRAPHY;  Surgeon: Iran Ouch, MD;  Location: [facility] INVASIVE CV LAB;  Service: Cardiovascular;  Laterality: N/A;   RADIOLOGY WITH ANESTHESIA N/A 04/11/2023   Procedure: IR WITH ANESTHESIA;  Surgeon: Oley Balm, MD;  Location: [facility] OR;  Service: Radiology;  Laterality: N/A;   VISCERAL ANGIOGRAPHY N/A 10/24/2022   Procedure: VISCERAL ANGIOGRAPHY;  Surgeon: Annice Needy, MD;  Location: [facility] INVASIVE CV LAB;  Service: Cardiovascular;  Laterality: N/A;   VISCERAL ARTERY INTERVENTION N/A 05/09/2023   Procedure: VISCERAL ARTERY INTERVENTION;  Surgeon: Renford Dills, MD;  Location: [facility] INVASIVE CV LAB;  Service: Cardiovascular;  Laterality: N/A;   Social History:  reports that he has been smoking cigarettes. He has never used smokeless tobacco. He reports current alcohol use of about 12.0 standard drinks of alcohol per week. He reports current drug use. Drugs: Cocaine and Marijuana.  Allergies  Allergen Reactions   Penicillins Hives and Rash    Tolerated Ceftriaxone 10/2022    Family History  Problem Relation Age of Onset   Coronary artery disease Mother        alive   Other Other        2 sisters alive and well   Alcohol abuse Father        died @ 15   Other Paternal Grandmother        PACER    Prior to Admission medications   Medication Sig Start Date End Date Taking? Authorizing Provider  albuterol (VENTOLIN HFA) 108 (90 Base) MCG/ACT inhaler Inhale 2 puffs into the lungs every 6 (six) hours as needed for wheezing or shortness of breath. 10/15/23   Chesley Noon, MD  docusate sodium (COLACE) 100 MG capsule Take 1 capsule (100 mg total) by mouth 2 (two) times daily as needed (if less than 3 BM"s in past 24 hours). 08/25/23   Pennie Banter, DO  feeding supplement (ENSURE ENLIVE / ENSURE PLUS) LIQD Take 237 mLs by mouth 2 (two) times daily between meals. 08/25/23   Pennie Banter, DO  furosemide (LASIX) 40 MG tablet Take 1 tablet (40 mg total) by mouth  daily. 09/08/23 11/07/23  Tresa Moore, MD  gabapentin (NEURONTIN) 100 MG capsule Take 1 capsule (100 mg total) by mouth 2 (two) times daily. 09/08/23 10/08/23  Tresa Moore, MD  guaiFENesin (MUCINEX) 600 MG 12 hr tablet Take 1 tablet (600 mg total) by mouth 2 (two) times daily for 7 days. 10/15/23 10/22/23  Chesley Noon, MD  hydrocortisone (CORTEF) 5 MG tablet Take 1 tablet (5 mg total) by mouth 2 (two) times daily. 08/25/23   Pennie Banter, DO  lactulose (CHRONULAC) 10 GM/15ML solution Take 45 mLs (30 g total) by mouth 3 (three) times daily. 08/25/23   Pennie Banter, DO  methocarbamol (ROBAXIN) 750 MG tablet Take 1 tablet (750 mg total) by mouth 2 (two) times daily as needed for muscle spasms. 09/08/23   Tresa Moore, MD  midodrine (PROAMATINE) 10 MG tablet Take 1 tablet (10 mg total) by mouth 3 (three) times daily with meals. 08/25/23   Esaw Grandchild A, DO  oxyCODONE (OXY IR/ROXICODONE) 5 MG immediate release tablet Take 1 tablet (5 mg total) by mouth every 8 (eight) hours as needed for severe pain. 09/08/23   Tresa Moore, MD  polyethylene glycol powder (GLYCOLAX/MIRALAX) 17 GM/SCOOP powder Take 17 g by mouth daily as needed (if less than 3 BM"s in past 24 hours). 08/25/23   Esaw Grandchild A, DO  potassium chloride SA (KLOR-CON M) 20 MEQ tablet Take 1 tablet (20 mEq total) by mouth daily. 08/26/23   Pennie Banter, DO  QUEtiapine (SEROQUEL) 50 MG tablet Take 1 tablet (50 mg total) by mouth at bedtime. 09/08/23 10/08/23  Tresa Moore, MD  rosuvastatin (CRESTOR) 40 MG tablet TAKE 1 TABLET BY MOUTH EVERY DAY 10/15/23   Medina-Vargas, Monina C, NP    Physical Exam: Vitals:   10/16/23 0859 10/16/23 0955 10/16/23 1000 10/16/23 1045  BP: 135/83 (!) 142/74 (!) 142/86 118/62  Pulse: (!) 101 (!) 117 (!) 118 (!) 113  Resp:   (!) 24 20  Temp:      TempSrc:      SpO2:      Weight:      Height:       Constitutional: NAD Eyes: PERRL, lids and conjunctivae  normal ENMT: Mucous membranes are moist. Posterior pharynx clear of any exudate or lesions. Poor dentition.  Neck: normal, supple, no masses, no thyromegaly Respiratory: clear to auscultation bilaterally, no wheezing, no crackles. Normal respiratory effort. No accessory muscle use.  Cardiovascular: Regular rate and rhythm, no murmurs / rubs / gallops. No extremity edema. 2+ radial and pedal pulses.  Abdomen: no tenderness, no masses palpated. No hepatosplenomegaly. Bowel sounds active x4.  Musculoskeletal: no clubbing / cyanosis. No joint deformity upper and lower extremities. Good ROM, no contractures. Normal muscle tone.  Skin: no rashes, lesions, ulcers. Warm, dry. Multiple small puncture makes noted  on bilateral upper extremities between wrists and elbows. Neurologic: CN 2-12 grossly intact. Sensation intact. Alert and oriented x 3.  Psych: Anxious  Data Reviewed: CBC    Component Value Date/Time   WBC 5.6 10/16/2023 0428   RBC 3.56 (L) 10/16/2023 0428   HGB 10.1 (L) 10/16/2023 0428   HGB 15.0 09/06/2013 1458   HCT 30.6 (L) 10/16/2023 0428   HCT 42.2 09/06/2013 1458   PLT 97 (L) 10/16/2023 0428   PLT 270 09/06/2013 1458   MCV 86.0 10/16/2023 0428   MCV 95 09/06/2013 1458   MCH 28.4 10/16/2023 0428   MCHC 33.0 10/16/2023 0428   RDW 18.2 (H) 10/16/2023 0428   RDW 13.8 09/06/2013 1458   LYMPHSABS 1.6 10/16/2023 0428   MONOABS 0.7 10/16/2023 0428   EOSABS 0.1 10/16/2023 0428   BASOSABS 0.1 10/16/2023 0428      Latest Ref Rng & Units 10/16/2023    4:28 AM 10/15/2023    3:04 AM 09/08/2023    5:30 AM  BMP  Glucose 70 - 99 mg/dL 161  096  045   BUN 6 - 20 mg/dL 6  <5  13   Creatinine 0.61 - 1.24 mg/dL 4.09  8.11  9.14   Sodium 135 - 145 mmol/L 139  139  136   Potassium 3.5 - 5.1 mmol/L 3.1  3.5  3.5   Chloride 98 - 111 mmol/L 107  107  104   CO2 22 - 32 mmol/L 21  21  24    Calcium 8.9 - 10.3 mg/dL 8.4  8.4  9.1    Cardiac Panel (last 3 results) Recent Labs     10/15/23 0501 10/16/23 0428 10/16/23 0749  TROPONINIHS 22* 19* 18*   Alcohol Level    Component Value Date/Time   ETH 275 (H) 10/16/2023 0428   Results for orders placed or performed during the hospital encounter of 10/15/23  Resp panel by RT-PCR (RSV, Flu A&B, Covid) Anterior Nasal Swab     Status: None   Collection Time: 10/15/23  3:04 AM   Specimen: Anterior Nasal Swab  Result Value Ref Range Status   SARS Coronavirus 2 by RT PCR NEGATIVE NEGATIVE Final    Comment: (NOTE) SARS-CoV-2 target nucleic acids are NOT DETECTED.  The SARS-CoV-2 RNA is generally detectable in upper respiratory specimens during the acute phase of infection. The lowest concentration of SARS-CoV-2 viral copies this assay can detect is 138 copies/mL. A negative result does not preclude SARS-Cov-2 infection and should not be used as the sole basis for treatment or other patient management decisions. A negative result may occur with  improper specimen collection/handling, submission of specimen other than nasopharyngeal swab, presence of viral mutation(s) within the areas targeted by this assay, and inadequate number of viral copies(<138 copies/mL). A negative result must be combined with clinical observations, patient history, and epidemiological information. The expected result is Negative.  Fact Sheet for Patients:  BloggerCourse.com  Fact Sheet for Healthcare Providers:  SeriousBroker.it  This test is no t yet approved or cleared by the [place] FDA and  has been authorized for detection and/or diagnosis of SARS-CoV-2 by FDA under an Emergency Use Authorization (EUA). This EUA will remain  in effect (meaning this test can be used) for the duration of the COVID-19 declaration under Section 564(b)(1) of the Act, 21 U.S.C.section 360bbb-3(b)(1), unless the authorization is terminated  or revoked sooner.       Influenza A by PCR NEGATIVE  NEGATIVE Final  Influenza B by PCR NEGATIVE NEGATIVE Final    Comment: (NOTE) The Xpert Xpress SARS-CoV-2/FLU/RSV plus assay is intended as an aid in the diagnosis of influenza from Nasopharyngeal swab specimens and should not be used as a sole basis for treatment. Nasal washings and aspirates are unacceptable for Xpert Xpress SARS-CoV-2/FLU/RSV testing.  Fact Sheet for Patients: BloggerCourse.com  Fact Sheet for Healthcare Providers: SeriousBroker.it  This test is not yet approved or cleared by the [place] FDA and has been authorized for detection and/or diagnosis of SARS-CoV-2 by FDA under an Emergency Use Authorization (EUA). This EUA will remain in effect (meaning this test can be used) for the duration of the COVID-19 declaration under Section 564(b)(1) of the Act, 21 U.S.C. section 360bbb-3(b)(1), unless the authorization is terminated or revoked.     Resp Syncytial Virus by PCR NEGATIVE NEGATIVE Final    Comment: (NOTE) Fact Sheet for Patients: BloggerCourse.com  Fact Sheet for Healthcare Providers: SeriousBroker.it  This test is not yet approved or cleared by the [place] FDA and has been authorized for detection and/or diagnosis of SARS-CoV-2 by FDA under an Emergency Use Authorization (EUA). This EUA will remain in effect (meaning this test can be used) for the duration of the COVID-19 declaration under Section 564(b)(1) of the Act, 21 U.S.C. section 360bbb-3(b)(1), unless the authorization is terminated or revoked.  Performed at [facility], [address] [place], [place] [address]    DG Chest [place] 1 View  Result Date: 10/16/2023 CLINICAL DATA:  Shortness of breath EXAM: PORTABLE CHEST 1 VIEW COMPARISON:  Yesterday FINDINGS: Chronic cardiopericardial enlargement. Somewhat congested appearance of central vessels. No Kerley lines, effusion,  consolidation, or pneumothorax. IMPRESSION: Cardiomegaly and mild vascular congestion.  No focal pneumonia. Electronically Signed   By: Tiburcio Pea M.D.   On: 10/16/2023 06:06     Assessment and Plan: #COPD Exacerbation #Recent Viral URI - LABA: Breo Ellipta - PRN SABA: Albuterol - Mucinex - Incentive Spirometry - Respiratory 20 pathogen panel ordered - Supportive care  #Chronic HFpEF Mild vascular congestion seen on CXR. Questionable compliance with outpatient lasix - Continue home lasix - Strict I/Os - Daily Weight  #CAD #Hyperlipidemia - Continue home rosuvastatin  #Polysubstance Abuse #ETOH Abuse #Tobacco Use Disorder -CIWA/Ativan protocol -Thiamine, Folic Acid -Nicotine Replacement Therapy  #Hepatic Cirrhosis #Portal Hypertension - Continue home lasix - Continue home Lactulose - PRN Miralax and Colace if less than 3 BMs daily (patient reports constipation despite lactulose)  #Hypokalemia Given 40 mEQ in ED - Follow up BMP, Mg with AM labs  #Chronic Thrombocytopenia PLT 97 - Mechanical VTE prophylaxis - Follow up CBC with AM labs - Monitor for signs of bleeding  #Adrenal Insufficiency #Orthostatic hypotension -Continue home Hydrocortisone -Continue home midodrine with hold parameters   VTE prophylaxis: SCDs GI prophylaxis: Protonix- GERD hx Diet: Heart Healthy Access: PIV Lines: NONE Code Status: FULL Telemetry: Yes Disposition: Admit to Med-Surg with Telemetry  Advance Care Planning:   Code Status: Full Code   Consults: None called  Family Communication: No family at bedside  Severity of Illness: The appropriate patient status for this patient is INPATIENT. Inpatient status is judged to be reasonable and necessary in order to provide the required intensity of service to ensure the patient's safety. The patient's presenting symptoms, physical exam findings, and initial radiographic and laboratory data in the context of their chronic  comorbidities is felt to place them at high risk for further clinical deterioration. Furthermore, it is not anticipated that the  patient will be medically stable for discharge from the hospital within 2 midnights of admission.   * I certify that at the point of admission it is my clinical judgment that the patient will require inpatient hospital care spanning beyond 2 midnights from the point of admission due to high intensity of service, high risk for further deterioration and high frequency of surveillance required.*  To reach the provider On-Call:   7AM- 7PM see care teams to locate the attending and reach out to them via [email or web address] Password: [email or web address] 7PM-7AM contact night-coverage If you still have difficulty reaching the appropriate provider, please page the [facility] (Director on Call) for [facility] on amion for assistance  This document was prepared using Conservation officer, historic buildings and may include unintentional dictation errors.  Bishop Limbo FNP-BC, PMHNP-BC Nurse Practitioner [facility]

## 2023-10-16 NOTE — Plan of Care (Signed)
  Problem: Activity: Goal: Will verbalize the importance of balancing activity with adequate rest periods Outcome: Progressing   Problem: Respiratory: Goal: Ability to maintain a clear airway will improve Outcome: Progressing   Problem: Respiratory: Goal: Ability to maintain a clear airway will improve Outcome: Progressing   Problem: Respiratory: Goal: Levels of oxygenation will improve Outcome: Progressing   Problem: Respiratory: Goal: Ability to maintain adequate ventilation will improve Outcome: Progressing

## 2023-10-16 NOTE — ED Notes (Signed)
Unsuccessful IV attempts x 2 for troponin collection; Phlebotomy made aware.

## 2023-10-16 NOTE — ED Provider Notes (Signed)
[facility] Provider Note    Event Date/Time   First MD Initiated Contact with Patient 10/16/23 0354     (approximate)   History   Shortness of Breath   HPI  Ethan Wells is a 52 y.o. male  brought to the ED via EMS from home with a chief complaint of shortness of breath. Patient seen for same last night, also intoxicated with cocaine use. History of COPD not on home oxygen. Given Albuterol neb and ASA by EMS. Denies fever/chills, abdominal pain, N/V or dizziness.       Past Medical History   Past Medical History:  Diagnosis Date   CAD (coronary artery disease)    a. 01/2011 Anterior STEMI/Cath/PCI: LM nl, LAD 100d (3.5x44mm Vision BMS placed), LCX 31m, RI 50, RCA min irregs, EF 40% w/ apical, inferoapical HK; b. 10/2022 NSTSEMI/Cath: LM nl, LAD 5p/m, RI 85, LCX 136m, OM1 mild dzs, OM3 100 fills via L->L collats from dLAD, RCA 80p, 130m, RPDA fills via collats from LAD. EF 45-50%-->Med Rx.   Cardiac arrest - ventricular fibrillation    a. In setting of STEMI 01/2011   Chronic pain    Cocaine abuse (HCC)    Depression with anxiety    ETOH abuse    a. 6-12 beers/day   GERD (gastroesophageal reflux disease)    GI bleed    Hemorrhoids    Hyperlipidemia    Hypertension    Ischemic cardiomyopathy    a. 06/2011 Echo: EF 45-50%, No rwma; b. 10/2022 Echo: EF 55-60%, no rwma, nl RV fxn.   Marijuana abuse    Migraines    Tobacco abuse    a. 1/2 ppd x 26 yrs     Active Problem List   Patient Active Problem List   Diagnosis Date Noted   COPD exacerbation (HCC) 10/16/2023   Cirrhosis of liver (HCC) 09/03/2023   Acute metabolic encephalopathy 08/11/2023   Acute blood loss anemia (ABLA) 08/08/2023   Elevated liver function tests 07/02/2023   Pancytopenia (HCC) 07/01/2023   Metabolic acidosis 07/01/2023   Liver mass 06/30/2023   Nausea vomiting and diarrhea 06/30/2023   Migraine 06/30/2023   Cholecystitis, acute 05/10/2023   UTI (urinary  tract infection) 05/05/2023   Chronic diastolic CHF (congestive heart failure) (HCC) 05/05/2023   Alcohol abuse 05/05/2023   Alcoholic cirrhosis (HCC) 04/12/2023   Esophageal varices with bleeding in diseases classified elsewhere (HCC) 04/09/2023   Hepatic encephalopathy (HCC) 04/09/2023   Secondary esophageal varices with bleeding (HCC) 04/08/2023   Upper GI bleeding 04/07/2023   Leg edema 03/01/2023   GI bleeding 02/22/2023   HLD (hyperlipidemia) 02/22/2023   Adrenal insufficiency (HCC) 02/22/2023   Alcoholic liver disease (HCC) 02/22/2023   Upper GI bleed 02/22/2023   Myocardial injury 01/29/2023   Adrenal insufficiency (Addison's disease) (HCC) 01/28/2023   Reactive thrombocytosis 01/24/2023   Hypophosphatemia 01/22/2023   Acute hypoxic respiratory failure (HCC) 01/20/2023   Orthostatic hypotension 01/17/2023   Anasarca 01/17/2023   Hemorrhagic shock (HCC) 01/17/2023   Obesity (BMI 30-39.9) 01/17/2023   Cocaine dependence with cocaine-induced disorder (HCC) 01/17/2023   Rectal bleeding 01/17/2023   Peripheral edema 01/17/2023   History of benzodiazepine use 01/17/2023   Chest pain 11/19/2022   Acute on chronic heart failure with preserved ejection fraction (HFpEF) (HCC) 10/29/2022   Acute respiratory distress 10/29/2022   Elevated rheumatoid factor 10/28/2022   Hyperkalemia 10/25/2022   Hemorrhoids, internal, with bleeding 10/25/2022   Mesenteric artery stenosis (HCC) 10/23/2022  Iron deficiency anemia 10/23/2022   Hematochezia    Abnormal CT scan, colon    Colitis 09/30/2022   Prolonged QT interval 09/30/2022   Alcohol use disorder 08/19/2022   GI bleed 05/28/2022   Non-intractable vomiting    Epigastric pain    Polyp of ascending colon    Anemia due to GI blood loss 08/07/2021   Acute gastritis    Hyperbilirubinemia    Elevated troponin    Notalgia    History of ST elevation myocardial infarction (STEMI) 12/19/2020   Alcohol dependence (HCC) 12/19/2020    Depression with anxiety 10/05/2020   Esophageal reflux 10/05/2020   Headache 10/05/2020   Hyperlipidemia 10/05/2020   Hyponatremia 06/04/2020   Hypomagnesemia 06/04/2020   Chronic systolic CHF (congestive heart failure) (HCC) 06/04/2020   Leukocytosis 06/04/2020   Abdominal pain 06/04/2020   Hypokalemia 06/04/2020   Ischemic cardiomyopathy    Polysubstance abuse (HCC)    NSTEMI (non-ST elevated myocardial infarction) (HCC) 07/12/2019   Abdominal pain, RUQ 05/25/2018   Chest pain, mid sternal 11/17/2012   Cocaine abuse (HCC)    Marijuana abuse    Tobacco abuse 02/26/2011   CAD (coronary artery disease) 02/26/2011   Other specified forms of chronic ischemic heart disease 02/26/2011     Past Surgical History   Past Surgical History:  Procedure Laterality Date   COLONOSCOPY  08/09/2021   Procedure: COLONOSCOPY;  Surgeon: Midge Minium, MD;  Location: [facility] ENDOSCOPY;  Service: Endoscopy;;   CORONARY ANGIOPLASTY WITH STENT PLACEMENT     ESOPHAGEAL BANDING  04/08/2023   Procedure: ESOPHAGEAL BANDING;  Surgeon: Tressia Danas, MD;  Location: [facility] ENDOSCOPY;  Service: Gastroenterology;;   ESOPHAGOGASTRODUODENOSCOPY N/A 05/30/2022   Procedure: ESOPHAGOGASTRODUODENOSCOPY (EGD);  Surgeon: Toney Reil, MD;  Location: [facility] ENDOSCOPY;  Service: Gastroenterology;  Laterality: N/A;   ESOPHAGOGASTRODUODENOSCOPY (EGD) WITH PROPOFOL N/A 03/01/2021   Procedure: ESOPHAGOGASTRODUODENOSCOPY (EGD) WITH PROPOFOL;  Surgeon: Toney Reil, MD;  Location: [facility] SURGERY CNTR;  Service: Endoscopy;  Laterality: N/A;   ESOPHAGOGASTRODUODENOSCOPY (EGD) WITH PROPOFOL N/A 08/09/2021   Procedure: ESOPHAGOGASTRODUODENOSCOPY (EGD) WITH PROPOFOL;  Surgeon: Midge Minium, MD;  Location: [facility] ENDOSCOPY;  Service: Endoscopy;  Laterality: N/A;   ESOPHAGOGASTRODUODENOSCOPY (EGD) WITH PROPOFOL N/A 03/01/2023   Procedure: ESOPHAGOGASTRODUODENOSCOPY (EGD) WITH PROPOFOL;  Surgeon: Jaynie Collins, DO;  Location:  [facility] ENDOSCOPY;  Service: Gastroenterology;  Laterality: N/A;   ESOPHAGOGASTRODUODENOSCOPY (EGD) WITH PROPOFOL N/A 04/08/2023   Procedure: ESOPHAGOGASTRODUODENOSCOPY (EGD) WITH PROPOFOL;  Surgeon: Tressia Danas, MD;  Location: [facility] ENDOSCOPY;  Service: Gastroenterology;  Laterality: N/A;   ESOPHAGOGASTRODUODENOSCOPY (EGD) WITH PROPOFOL N/A 08/09/2023   Procedure: ESOPHAGOGASTRODUODENOSCOPY (EGD) WITH PROPOFOL;  Surgeon: Regis Bill, MD;  Location: [facility] ENDOSCOPY;  Service: Endoscopy;  Laterality: N/A;   ESOPHAGOGASTRODUODENOSCOPY (EGD) WITH PROPOFOL N/A 08/19/2023   Procedure: ESOPHAGOGASTRODUODENOSCOPY (EGD) WITH PROPOFOL;  Surgeon: Regis Bill, MD;  Location: [facility] ENDOSCOPY;  Service: Endoscopy;  Laterality: N/A;   EVALUATION UNDER ANESTHESIA WITH HEMORRHOIDECTOMY N/A 10/31/2022   Procedure: EXAM UNDER ANESTHESIA WITH SUTURE LIGATION OF TWO BLEEDING PEDICLES;  Surgeon: Henrene Dodge, MD;  Location: [facility] ORS;  Service: General;  Laterality: N/A;   FLEXIBLE SIGMOIDOSCOPY N/A 10/02/2022   Procedure: FLEXIBLE SIGMOIDOSCOPY;  Surgeon: Meryl Dare, MD;  Location: [facility] ENDOSCOPY;  Service: Gastroenterology;  Laterality: N/A;   HEMOSTASIS CONTROL  08/09/2023   Procedure: HEMOSTASIS CONTROL;  Surgeon: Regis Bill, MD;  Location: [facility] ENDOSCOPY;  Service: Endoscopy;;   IR TIPS  04/11/2023   IR US GUIDE VASC ACCESS RIGHT  04/11/2023  IR US GUIDE VASC ACCESS RIGHT  04/11/2023   IR US GUIDE VASC ACCESS RIGHT  04/11/2023   LEFT HEART CATH AND CORONARY ANGIOGRAPHY N/A 11/04/2022   Procedure: LEFT HEART CATH AND CORONARY ANGIOGRAPHY;  Surgeon: Iran Ouch, MD;  Location: [facility] INVASIVE CV LAB;  Service: Cardiovascular;  Laterality: N/A;   RADIOLOGY WITH ANESTHESIA N/A 04/11/2023   Procedure: IR WITH ANESTHESIA;  Surgeon: Oley Balm, MD;  Location: [facility] OR;  Service: Radiology;  Laterality: N/A;   VISCERAL ANGIOGRAPHY N/A 10/24/2022   Procedure: VISCERAL ANGIOGRAPHY;  Surgeon: Annice Needy, MD;  Location: [facility] INVASIVE CV LAB;  Service: Cardiovascular;  Laterality: N/A;   VISCERAL ARTERY INTERVENTION N/A 05/09/2023   Procedure: VISCERAL ARTERY INTERVENTION;  Surgeon: Renford Dills, MD;  Location: [facility] INVASIVE CV LAB;  Service: Cardiovascular;  Laterality: N/A;     Home Medications   Prior to Admission medications   Medication Sig Start Date End Date Taking? Authorizing Provider  albuterol (VENTOLIN HFA) 108 (90 Base) MCG/ACT inhaler Inhale 2 puffs into the lungs every 6 (six) hours as needed for wheezing or shortness of breath. 10/15/23   Chesley Noon, MD  docusate sodium (COLACE) 100 MG capsule Take 1 capsule (100 mg total) by mouth 2 (two) times daily as needed (if less than 3 BM"s in past 24 hours). 08/25/23   Pennie Banter, DO  feeding supplement (ENSURE ENLIVE / ENSURE PLUS) LIQD Take 237 mLs by mouth 2 (two) times daily between meals. 08/25/23   Pennie Banter, DO  furosemide (LASIX) 40 MG tablet Take 1 tablet (40 mg total) by mouth daily. 09/08/23 11/07/23  Tresa Moore, MD  gabapentin (NEURONTIN) 100 MG capsule Take 1 capsule (100 mg total) by mouth 2 (two) times daily. 09/08/23 10/08/23  Tresa Moore, MD  guaiFENesin (MUCINEX) 600 MG 12 hr tablet Take 1 tablet (600 mg total) by mouth 2 (two) times daily for 7 days. 10/15/23 10/22/23  Chesley Noon, MD  hydrocortisone (CORTEF) 5 MG tablet Take 1 tablet (5 mg total) by mouth 2 (two) times daily. 08/25/23   Pennie Banter, DO  lactulose (CHRONULAC) 10 GM/15ML solution Take 45 mLs (30 g total) by mouth 3 (three) times daily. 08/25/23   Pennie Banter, DO  methocarbamol (ROBAXIN) 750 MG tablet Take 1 tablet (750 mg total) by mouth 2 (two) times daily as needed for muscle spasms. 09/08/23   Tresa Moore, MD  midodrine (PROAMATINE) 10 MG tablet Take 1 tablet (10 mg total) by mouth 3 (three) times daily with meals. 08/25/23   Esaw Grandchild A, DO  oxyCODONE (OXY IR/ROXICODONE) 5 MG immediate  release tablet Take 1 tablet (5 mg total) by mouth every 8 (eight) hours as needed for severe pain. 09/08/23   Tresa Moore, MD  polyethylene glycol powder (GLYCOLAX/MIRALAX) 17 GM/SCOOP powder Take 17 g by mouth daily as needed (if less than 3 BM"s in past 24 hours). 08/25/23   Esaw Grandchild A, DO  potassium chloride SA (KLOR-CON M) 20 MEQ tablet Take 1 tablet (20 mEq total) by mouth daily. 08/26/23   Pennie Banter, DO  QUEtiapine (SEROQUEL) 50 MG tablet Take 1 tablet (50 mg total) by mouth at bedtime. 09/08/23 10/08/23  Tresa Moore, MD  rosuvastatin (CRESTOR) 40 MG tablet TAKE 1 TABLET BY MOUTH EVERY DAY 10/15/23   Medina-Vargas, Monina C, NP     Allergies  Penicillins   Family History   Family History  Problem Relation Age  of Onset   Coronary artery disease Mother        alive   Other Other        2 sisters alive and well   Alcohol abuse Father        died @ 56   Other Paternal Grandmother        PACER     Physical Exam  Triage Vital Signs: ED Triage Vitals  Encounter Vitals Group     BP      Systolic BP Percentile      Diastolic BP Percentile      Pulse      Resp      Temp      Temp src      SpO2      Weight      Height      Head Circumference      Peak Flow      Pain Score      Pain Loc      Pain Education      Exclude from Growth Chart     Updated Vital Signs: BP 130/75   Pulse (!) 102   Temp 97.9 F (36.6 C) (Oral)   Resp (!) 23   Ht 5\' 6"  (1.676 m)   Wt 90 kg   SpO2 100%   BMI 32.02 kg/m    General: Awake, mild to moderate distress.  CV:  RRR. Good peripheral perfusion.  Resp:  Increased effort. Audible wheezing. Abd:  Nontender. No distention.  Other:  No pedal edema.   ED Results / Procedures / Treatments  Labs (all labs ordered are listed, but only abnormal results are displayed) Labs Reviewed  CBC WITH DIFFERENTIAL/PLATELET - Abnormal; Notable for the following components:      Result Value   RBC 3.56 (*)     Hemoglobin 10.1 (*)    HCT 30.6 (*)    RDW 18.2 (*)    Platelets 97 (*)    All other components within normal limits  BASIC METABOLIC PANEL - Abnormal; Notable for the following components:   Potassium 3.1 (*)    CO2 21 (*)    Glucose, Bld 101 (*)    Creatinine, Ser 0.48 (*)    Calcium 8.4 (*)    All other components within normal limits  ETHANOL - Abnormal; Notable for the following components:   Alcohol, Ethyl (B) 275 (*)    All other components within normal limits  TROPONIN I (HIGH SENSITIVITY) - Abnormal; Notable for the following components:   Troponin I (High Sensitivity) 19 (*)    All other components within normal limits  TROPONIN I (HIGH SENSITIVITY)     EKG  ED ECG REPORT I, Jillyan Plitt J, the attending physician, personally viewed and interpreted this ECG.   Date: 10/16/2023  EKG Time: 0403  Rate: 92  Rhythm: normal sinus rhythm  Axis: Normal  Intervals:none  ST&T Change: Nonspecific    RADIOLOGY I have independently visualized and interpreted patient's imaging study as well as noted the radiology interpretation:  CXR wet read: No acute cardiopulmonary process  Official radiology report(s): No results found.   PROCEDURES:  Critical Care performed: No  .1-3 Lead EKG Interpretation  Performed by: Irean Hong, MD Authorized by: Irean Hong, MD     Interpretation: normal     ECG rate:  98   ECG rate assessment: normal     Rhythm: sinus rhythm     Ectopy: none  Conduction: normal   Comments:     Placed on cardiac monitor to evaluate for arrthymias    MEDICATIONS ORDERED IN ED: Medications  LORazepam (ATIVAN) tablet 0-4 mg (has no administration in time range)  thiamine (VITAMIN B1) tablet 100 mg (has no administration in time range)  methylPREDNISolone sodium succinate (SOLU-MEDROL) 125 mg/2 mL injection 125 mg (125 mg Intravenous Given 10/16/23 0433)  ipratropium-albuterol (DUONEB) 0.5-2.5 (3) MG/3ML nebulizer solution 3 mL (3 mLs  Nebulization Given 10/16/23 0603)  potassium chloride SA (KLOR-CON M) CR tablet 40 mEq (40 mEq Oral Given 10/16/23 0603)     IMPRESSION / MDM / ASSESSMENT AND PLAN / ED COURSE  I reviewed the triage vital signs and the nursing notes.                             52 year old male who returns to the ED for continued sob. Differential includes, but is not limited to, viral syndrome, bronchitis including COPD exacerbation, pneumonia, reactive airway disease including asthma, CHF including exacerbation with or without pulmonary/interstitial edema, pneumothorax, ACS, thoracic trauma, and pulmonary embolism. I have personally reviewed patient's ED record from yesterday.  Patient's presentation is most consistent with exacerbation of chronic illness.  The patient is on the cardiac monitor to evaluate for evidence of arrhythmia and/or significant heart rate changes.  Will repeat labwork, cxr. Administer IV Solumedrol, duoneb and reassess.  Clinical Course as of 10/16/23 1517  Thu Oct 16, 2023  0518 Elevated troponin likely demand ischemia. Requires second duoneb. Given this is a return visit for same complaint, will consult hospitalist service for evaluation and admission. [JS]    Clinical Course User Index [JS] Irean Hong, MD     FINAL CLINICAL IMPRESSION(S) / ED DIAGNOSES   Final diagnoses:  COPD exacerbation (HCC)  Hypokalemia  Elevated troponin  Alcoholic intoxication without complication (HCC)     Rx / DC Orders   ED Discharge Orders     None        Note:  This document was prepared using Dragon voice recognition software and may include unintentional dictation errors.   Irean Hong, MD 10/16/23 [phone]

## 2023-10-16 NOTE — ED Notes (Signed)
Pt made aware he needs to provide a sputum sample.

## 2023-10-16 NOTE — TOC Initial Note (Signed)
Transition of Care [facility]) - Initial/Assessment Note    Patient Details  Name: Ethan Wells MRN: [id number] Date of Birth: [birth year]  Transition of Care (TOC) CM/SW Contact:    Marquita Palms, LCSW Phone Number: 10/16/2023, 12:59 PM  Clinical Narrative:                  CSW SA       Patient Goals and CMS Choice            Expected Discharge Plan and Services                                              Prior Living Arrangements/Services                       Activities of Daily Living      Permission Sought/Granted                  Emotional Assessment              Admission diagnosis:  COPD exacerbation (HCC) [J44.1] Patient Active Problem List   Diagnosis Date Noted   COPD exacerbation (HCC) 10/16/2023   Hepatic cirrhosis (HCC) 09/03/2023   Acute metabolic encephalopathy 08/11/2023   Acute blood loss anemia (ABLA) 08/08/2023   Elevated liver function tests 07/02/2023   Pancytopenia (HCC) 07/01/2023   Metabolic acidosis 07/01/2023   Liver mass 06/30/2023   Nausea vomiting and diarrhea 06/30/2023   Migraine 06/30/2023   Cholecystitis, acute 05/10/2023   UTI (urinary tract infection) 05/05/2023   Chronic diastolic CHF (congestive heart failure) (HCC) 05/05/2023   Alcohol abuse 05/05/2023   Alcoholic cirrhosis (HCC) 04/12/2023   Esophageal varices with bleeding in diseases classified elsewhere (HCC) 04/09/2023   Hepatic encephalopathy (HCC) 04/09/2023   Secondary esophageal varices with bleeding (HCC) 04/08/2023   Upper GI bleeding 04/07/2023   Leg edema 03/01/2023   GI bleeding 02/22/2023   HLD (hyperlipidemia) 02/22/2023   Adrenal insufficiency (HCC) 02/22/2023   Alcoholic liver disease (HCC) 02/22/2023   Upper GI bleed 02/22/2023   Myocardial injury 01/29/2023   Adrenal insufficiency (Addison's disease) (HCC) 01/28/2023   Reactive thrombocytosis 01/24/2023   Hypophosphatemia 01/22/2023   Acute hypoxic  respiratory failure (HCC) 01/20/2023   Orthostatic hypotension 01/17/2023   Anasarca 01/17/2023   Hemorrhagic shock (HCC) 01/17/2023   Obesity (BMI 30-39.9) 01/17/2023   Cocaine dependence with cocaine-induced disorder (HCC) 01/17/2023   Rectal bleeding 01/17/2023   Peripheral edema 01/17/2023   History of benzodiazepine use 01/17/2023   Chest pain 11/19/2022   Acute on chronic heart failure with preserved ejection fraction (HFpEF) (HCC) 10/29/2022   Acute respiratory distress 10/29/2022   Elevated rheumatoid factor 10/28/2022   Hyperkalemia 10/25/2022   Hemorrhoids, internal, with bleeding 10/25/2022   Mesenteric artery stenosis (HCC) 10/23/2022   Iron deficiency anemia 10/23/2022   Hematochezia    Abnormal CT scan, colon    Colitis 09/30/2022   Prolonged QT interval 09/30/2022   Alcohol use disorder 08/19/2022   GI bleed 05/28/2022   Non-intractable vomiting    Epigastric pain    Polyp of ascending colon    Anemia due to GI blood loss 08/07/2021   Acute gastritis    Hyperbilirubinemia    Elevated troponin    Notalgia    History of ST elevation myocardial infarction (STEMI) 12/19/2020  Alcohol dependence (HCC) 12/19/2020   Depression with anxiety 10/05/2020   Esophageal reflux 10/05/2020   Headache 10/05/2020   Hyperlipidemia 10/05/2020   Hyponatremia 06/04/2020   Hypomagnesemia 06/04/2020   Chronic systolic CHF (congestive heart failure) (HCC) 06/04/2020   Leukocytosis 06/04/2020   Abdominal pain 06/04/2020   Hypokalemia 06/04/2020   Ischemic cardiomyopathy    Polysubstance abuse (HCC)    NSTEMI (non-ST elevated myocardial infarction) (HCC) 07/12/2019   Abdominal pain, RUQ 05/25/2018   Chest pain, mid sternal 11/17/2012   Cocaine abuse (HCC)    Marijuana abuse    Tobacco abuse 02/26/2011   CAD (coronary artery disease) 02/26/2011   Other specified forms of chronic ischemic heart disease 02/26/2011   PCP:  [name], NP Pharmacy:   [facility]  [address]  - [address] [place] [address] Phone: [phone] Fax: [phone]  [facility] [address] [place] [address] Phone: [phone] Fax: [phone]  [id number] [place], [place] - [address] [place] [address] Phone: [phone] Fax: [phone]  [facility] - [facility] [address] [place] [address] Phone: [phone] Fax: [phone]     Social Determinants of Health (SDOH) Social History: SDOH Screenings   Food Insecurity: No Food Insecurity (09/03/2023)  Housing: Patient Unable To Answer (09/03/2023)  Transportation Needs: Unmet Transportation Needs (09/03/2023)  Utilities: Not At Risk (09/03/2023)  Alcohol Screen: Medium Risk (10/01/2022)  Tobacco Use: High Risk (10/16/2023)   SDOH Interventions:     Readmission Risk Interventions    10/16/2023   12:56 PM 08/11/2023   10:35 AM 02/24/2023    3:12 PM  Readmission Risk Prevention Plan  Transportation Screening Complete Complete Complete  Medication Review (RN Care Manager) Complete Complete Complete  PCP or Specialist appointment within 3-5 days of discharge Complete Complete   HRI or Home Care Consult Patient refused    SW Recovery Care/Counseling Consult Patient refused Complete   Palliative Care Screening Patient Refused Not Applicable Not Applicable  Skilled Nursing Facility Patient Refused Not Applicable Not Applicable

## 2023-10-16 NOTE — ED Triage Notes (Signed)
Pt arrived via [facility] from home with c/o cough and increased SOB since being diagnosed with a URI on 10/15/23 here in ED. Pt admits to consuming ETOH prior to arrival. Pt A&O x4 on arrival. Room air oxygen saturation 97% on arrival to ED. Per EMS, pt received 1 albuterol treatment and 324mg  aspirin prior to arrival to ED.

## 2023-10-17 DIAGNOSIS — J441 Chronic obstructive pulmonary disease with (acute) exacerbation: Secondary | ICD-10-CM | POA: Diagnosis not present

## 2023-10-17 LAB — COMPREHENSIVE METABOLIC PANEL
ALT: 25 U/L (ref 0–44)
AST: 96 U/L — ABNORMAL HIGH (ref 15–41)
Albumin: 3.1 g/dL — ABNORMAL LOW (ref 3.5–5.0)
Alkaline Phosphatase: 195 U/L — ABNORMAL HIGH (ref 38–126)
Anion gap: 9 (ref 5–15)
BUN: 9 mg/dL (ref 6–20)
CO2: 21 mmol/L — ABNORMAL LOW (ref 22–32)
Calcium: 8.3 mg/dL — ABNORMAL LOW (ref 8.9–10.3)
Chloride: 104 mmol/L (ref 98–111)
Creatinine, Ser: 0.49 mg/dL — ABNORMAL LOW (ref 0.61–1.24)
GFR, Estimated: >60 mL/min (ref >60)
Glucose, Bld: 149 mg/dL — ABNORMAL HIGH (ref 70–99)
Potassium: 3.4 mmol/L — ABNORMAL LOW (ref 3.5–5.1)
Sodium: 134 mmol/L — ABNORMAL LOW (ref 135–145)
Total Bilirubin: 6.3 mg/dL — ABNORMAL HIGH (ref 0.3–1.2)
Total Protein: 6.9 g/dL (ref 6.5–8.1)

## 2023-10-17 LAB — EXPECTORATED SPUTUM ASSESSMENT W GRAM STAIN, RFLX TO RESP C

## 2023-10-17 LAB — MAGNESIUM: Magnesium: 1.4 mg/dL — ABNORMAL LOW (ref 1.7–2.4)

## 2023-10-17 LAB — CBC
HCT: 28.8 % — ABNORMAL LOW (ref 39.0–52.0)
Hemoglobin: 9.4 g/dL — ABNORMAL LOW (ref 13.0–17.0)
MCH: 29 pg (ref 26.0–34.0)
MCHC: 32.6 g/dL (ref 30.0–36.0)
MCV: 88.9 fL (ref 80.0–100.0)
Platelets: 69 10*3/uL — ABNORMAL LOW (ref 150–400)
RBC: 3.24 MIL/uL — ABNORMAL LOW (ref 4.22–5.81)
RDW: 18.2 % — ABNORMAL HIGH (ref 11.5–15.5)
WBC: 6.7 10*3/uL (ref 4.0–10.5)
nRBC: 0 % (ref 0.0–0.2)

## 2023-10-17 MED ORDER — POTASSIUM CHLORIDE CRYS ER 20 MEQ PO TBCR
40.0000 meq | EXTENDED_RELEASE_TABLET | Freq: Once | ORAL | Status: AC
  Administered 2023-10-17: 40 meq via ORAL
  Filled 2023-10-17: qty 2

## 2023-10-17 MED ORDER — ENOXAPARIN SODIUM 40 MG/0.4ML IJ SOSY
40.0000 mg | PREFILLED_SYRINGE | INTRAMUSCULAR | Status: DC
  Administered 2023-10-17: 40 mg via SUBCUTANEOUS
  Filled 2023-10-17 (×2): qty 0.4

## 2023-10-17 MED ORDER — IBUPROFEN 400 MG PO TABS
600.0000 mg | ORAL_TABLET | Freq: Four times a day (QID) | ORAL | Status: DC | PRN
  Administered 2023-10-17 – 2023-10-18 (×2): 600 mg via ORAL
  Filled 2023-10-17 (×2): qty 2

## 2023-10-17 MED ORDER — OXYCODONE HCL 5 MG PO TABS
5.0000 mg | ORAL_TABLET | ORAL | Status: DC | PRN
  Administered 2023-10-17: 5 mg via ORAL
  Filled 2023-10-17: qty 1

## 2023-10-17 MED ORDER — FUROSEMIDE 10 MG/ML IJ SOLN
40.0000 mg | Freq: Once | INTRAMUSCULAR | Status: AC
  Administered 2023-10-17: 40 mg via INTRAVENOUS
  Filled 2023-10-17: qty 4

## 2023-10-17 MED ORDER — MAGNESIUM CHLORIDE 64 MG PO TBEC
2.0000 | DELAYED_RELEASE_TABLET | Freq: Every day | ORAL | Status: DC
  Administered 2023-10-17 – 2023-10-18 (×2): 128 mg via ORAL
  Filled 2023-10-17 (×2): qty 2

## 2023-10-17 NOTE — Hospital Course (Signed)
HPI: Ethan Wells is a 52 y.o. male with medical history significant of liver cirrhosis s/p TIPS, superior mesenteric artery stenosis s/p stenting, CAD, STEMI s/p PCI with stenting, HFpEF, ischemic cardiomyopathy, prior V-fib cardiac arrest, hypertension, hyperlipidemia, GERD, migraine headaches polysubstance abuse (cocaine, alcohol, marijuana and tobacco), orthostatic hypotension, Addison's/adrenal insufficiency, chronic narcotic dependence, and recurrent GI bleeds.  He has had 2 ED visits in 24 hours. Day prior to this presentation reported 2-week history of productive cough with sputum that he describes as black in color, shortness of breath, nasal congestion, and was noted to be intoxicated.  He was given a DuoNeb and Tussionex with improvement in symptoms, he was monitored until clinically sober and discharged home.  He re-presents to the emergency department 10/31 morning with shortness of breath. He is also reporting midepigastric pain that radiates to his back.   Hospital course / significant events:  10/31: admitted for COPD exacerbation, CTA chest r/o dissection/PE given reports of black mucus he's coughing up.  11/01: off O2, still significant dyspnea on exertion but O2 levels ok. Trial lasix IV and repeat walk tomorrow, will have him work w/ PT/OT, get DME set up and suspect will need home health  11/02: ***  Consultants:  none  Procedures/Surgeries: none      ASSESSMENT & PLAN:   COPD Exacerbation Recent Viral URI - LABA: Breo Ellipta - PRN SABA: Albuterol - Mucinex - Incentive Spirometry - Respiratory 20 pathogen panel ordered - Supportive care   Chronic HFpEF Mild vascular congestion seen on CXR. Questionable compliance with outpatient lasix - Continue home lasix - IV dose today given dyspnea and mild congestion on CXR  - Strict I/Os - Daily Weight   CAD Hyperlipidemia - Continue home rosuvastatin   Polysubstance Abuse ETOH Abuse Tobacco Use Disorder -  CIWA/Ativan protocol -Thiamine, Folic Acid - Nicotine Replacement Therapy   Hepatic Cirrhosis Portal Hypertension - Continue home lasix - Continue home Lactulose - PRN Miralax and Colace if less than 3 BMs daily (patient reports constipation despite lactulose) - no tylenol    Hypokalemia Given 40 mEQ in ED - Follow up BMP, Mg with AM labs   Chronic Thrombocytopenia PLT 97 - Mechanical VTE prophylaxis - Follow up CBC with AM labs - Monitor for signs of bleeding   Adrenal Insufficiency Orthostatic hypotension -Continue home Hydrocortisone -Continue home midodrine with hold parameters       obesity based on BMI: Body mass index is 32.02 kg/m.  Underweight - under 18.5  normal weight - 18.5 to 24.9 overweight - 25 to 29.9 obese - 30 or more   DVT prophylaxis: lovenox IV fluids: no continuous IV fluids  Nutrition: cardiac w/ fluid restrict Central lines / invasive devices: none  Code Status: FULL CODE ACP documentation reviewed:  none on file in [facility]  TOC needs: DME, HH Barriers to dispo / significant pending items: diuresing today, if ambulating appropriately tomorrow and not needing O2 will anticipate discharge

## 2023-10-17 NOTE — Progress Notes (Signed)
PROGRESS NOTE    Ethan Wells   ZOX:[id number] DOB: [birth year]  DOA: 10/16/2023 Date of Service: 10/17/23 which is hospital day 1  PCP: [name], NP    HPI: Ethan Wells is a 52 y.o. male with medical history significant of liver cirrhosis s/p TIPS, superior mesenteric artery stenosis s/p stenting, CAD, STEMI s/p PCI with stenting, HFpEF, ischemic cardiomyopathy, prior V-fib cardiac arrest, hypertension, hyperlipidemia, GERD, migraine headaches polysubstance abuse (cocaine, alcohol, marijuana and tobacco), orthostatic hypotension, Addison's/adrenal insufficiency, chronic narcotic dependence, and recurrent GI bleeds.  He has had 2 ED visits in 24 hours. Day prior to this presentation reported 2-week history of productive cough with sputum that he describes as black in color, shortness of breath, nasal congestion, and was noted to be intoxicated.  He was given a DuoNeb and Tussionex with improvement in symptoms, he was monitored until clinically sober and discharged home.  He re-presents to the emergency department 10/31 morning with shortness of breath. He is also reporting midepigastric pain that radiates to his back.   Hospital course / significant events:  10/31: admitted for COPD exacerbation, CTA chest r/o dissection/PE given reports of black mucus he's coughing up.  11/01: off O2, still significant dyspnea on exertion but O2 levels ok. Trial lasix IV and repeat walk tomorrow, will have him work w/ PT/OT, get DME set up and suspect will need home health   Consultants:  none  Procedures/Surgeries: none      ASSESSMENT & PLAN:   COPD Exacerbation Recent Viral URI - LABA: Breo Ellipta - PRN SABA: Albuterol - Mucinex - Incentive Spirometry - Respiratory 20 pathogen panel ordered - Supportive care   Chronic HFpEF Mild vascular congestion seen on CXR. Questionable compliance with outpatient lasix - Continue home lasix - IV dose today given dyspnea  and mild congestion on CXR  - Strict I/Os - Daily Weight   CAD Hyperlipidemia - Continue home rosuvastatin   Polysubstance Abuse ETOH Abuse Tobacco Use Disorder - CIWA/Ativan protocol -Thiamine, Folic Acid - Nicotine Replacement Therapy   Hepatic Cirrhosis Portal Hypertension - Continue home lasix - Continue home Lactulose - PRN Miralax and Colace if less than 3 BMs daily (patient reports constipation despite lactulose) - no tylenol    Hypokalemia Given 40 mEQ in ED - Follow up BMP, Mg with AM labs   Chronic Thrombocytopenia PLT 97 - Mechanical VTE prophylaxis - Follow up CBC with AM labs - Monitor for signs of bleeding   Adrenal Insufficiency Orthostatic hypotension -Continue home Hydrocortisone -Continue home midodrine with hold parameters       obesity based on BMI: Body mass index is 32.02 kg/m.  Underweight - under 18.5  normal weight - 18.5 to 24.9 overweight - 25 to 29.9 obese - 30 or more   DVT prophylaxis: lovenox IV fluids: no continuous IV fluids  Nutrition: cardiac w/ fluid restrict Central lines / invasive devices: none  Code Status: FULL CODE ACP documentation reviewed:  none on file in [facility]  TOC needs: DME, HH Barriers to dispo / significant pending items: diuresing today, if ambulating appropriately tomorrow and not needing O2 will anticipate discharge              Subjective / Brief ROS:  Patient reports coughing about the same Denies CP/SOB at rest but reports SOB on exertion Denies new weakness.  Tolerating diet.  Reports no concerns w/ urination/defecation.   Family Communication: none at this time     Objective Findings:  Vitals:  10/16/23 2350 10/17/23 0455 10/17/23 0815 10/17/23 1138  BP: 110/64 98/66 (!) 100/59   Pulse: (!) 110 (!) 107 100   Resp: 18 20 16    Temp: 98.7 F (37.1 C) 98.3 F (36.8 C) 98.2 F (36.8 C)   TempSrc: Oral     SpO2: 96% 95% 94% 94%  Weight:      Height:         Intake/Output Summary (Last 24 hours) at 10/17/2023 1340 Last data filed at 10/16/2023 1800 Gross per 24 hour  Intake --  Output 800 ml  Net -800 ml   Filed Weights   10/16/23 0406  Weight: 90 kg    Examination:  Physical Exam Constitutional:      General: He is not in acute distress. Cardiovascular:     Rate and Rhythm: Normal rate and regular rhythm.  Pulmonary:     Effort: Pulmonary effort is normal.     Breath sounds: Decreased breath sounds present.  Musculoskeletal:     Right lower leg: No edema.     Left lower leg: No edema.  Neurological:     General: No focal deficit present.     Mental Status: He is alert and oriented to person, place, and time.  Psychiatric:        Mood and Affect: Mood normal.        Behavior: Behavior normal.          Scheduled Medications:   enoxaparin (LOVENOX) injection  40 mg Subcutaneous Q24H   fluticasone furoate-vilanterol  1 puff Inhalation Daily   folic acid  1 mg Oral Daily   furosemide  40 mg Intravenous Once   gabapentin  100 mg Oral BID   guaiFENesin  600 mg Oral BID   hydrocortisone  5 mg Oral BID   lactulose  30 g Oral TID   midodrine  5 mg Oral TID WC   nicotine  21 mg Transdermal Daily   potassium chloride  40 mEq Oral Once   QUEtiapine  50 mg Oral QHS   rosuvastatin  40 mg Oral Daily   thiamine  100 mg Oral Daily    Continuous Infusions:   PRN Medications:  albuterol, ibuprofen, LORazepam **OR** LORazepam, methocarbamol, nicotine polacrilex, polyethylene glycol powder, sodium chloride  Antimicrobials from admission:  Anti-infectives (From admission, onward)    None           Data Reviewed:  I have personally reviewed the following...  CBC: Recent Labs  Lab 10/15/23 0304 10/16/23 0428 10/17/23 0643  WBC 7.7 5.6 6.7  NEUTROABS  --  3.2  --   HGB 11.3* 10.1* 9.4*  HCT 35.0* 30.6* 28.8*  MCV 86.8 86.0 88.9  PLT 109* 97* 69*   Basic Metabolic Panel: Recent Labs  Lab  10/15/23 0304 10/16/23 0428 10/17/23 0643  NA 139 139 134*  K 3.5 3.1* 3.4*  CL 107 107 104  CO2 21* 21* 21*  GLUCOSE 108* 101* 149*  BUN <5* 6 9  CREATININE 0.42* 0.48* 0.49*  CALCIUM 8.4* 8.4* 8.3*  MG  --   --  1.4*   GFR: Estimated Creatinine Clearance: 114.8 mL/min (A) (by C-G formula based on SCr of 0.49 mg/dL (L)). Liver Function Tests: Recent Labs  Lab 10/17/23 0643  AST 96*  ALT 25  ALKPHOS 195*  BILITOT 6.3*  PROT 6.9  ALBUMIN 3.1*   No results for input(s): "LIPASE", "AMYLASE" in the last 168 hours. No results for input(s): "AMMONIA" in the last  168 hours. Coagulation Profile: No results for input(s): "INR", "PROTIME" in the last 168 hours. Cardiac Enzymes: No results for input(s): "CKTOTAL", "CKMB", "CKMBINDEX", "TROPONINI" in the last 168 hours. BNP (last 3 results) No results for input(s): "PROBNP" in the last 8760 hours. HbA1C: No results for input(s): "HGBA1C" in the last 72 hours. CBG: No results for input(s): "GLUCAP" in the last 168 hours. Lipid Profile: No results for input(s): "CHOL", "HDL", "LDLCALC", "TRIG", "CHOLHDL", "LDLDIRECT" in the last 72 hours. Thyroid Function Tests: No results for input(s): "TSH", "T4TOTAL", "FREET4", "T3FREE", "THYROIDAB" in the last 72 hours. Anemia Panel: No results for input(s): "VITAMINB12", "FOLATE", "FERRITIN", "TIBC", "IRON", "RETICCTPCT" in the last 72 hours. Most Recent Urinalysis On File:     Component Value Date/Time   COLORURINE STRAW (A) 09/03/2023 0457   APPEARANCEUR CLEAR (A) 09/03/2023 0457   APPEARANCEUR Clear 03/16/2012 1315   LABSPEC 1.005 09/03/2023 0457   LABSPEC 1.013 03/16/2012 1315   PHURINE 6.0 09/03/2023 0457   GLUCOSEU NEGATIVE 09/03/2023 0457   GLUCOSEU 50 mg/dL 16/09/9603 5409   HGBUR NEGATIVE 09/03/2023 0457   BILIRUBINUR NEGATIVE 09/03/2023 0457   BILIRUBINUR Negative 03/16/2012 1315   KETONESUR NEGATIVE 09/03/2023 0457   PROTEINUR NEGATIVE 09/03/2023 0457   NITRITE NEGATIVE  09/03/2023 0457   LEUKOCYTESUR TRACE (A) 09/03/2023 0457   LEUKOCYTESUR Negative 03/16/2012 1315   Sepsis Labs: @LABRCNTIP (procalcitonin:4,lacticidven:4) Microbiology: Recent Results (from the past 240 hour(s))  Resp panel by RT-PCR (RSV, Flu A&B, Covid) Anterior Nasal Swab     Status: None   Collection Time: 10/15/23  3:04 AM   Specimen: Anterior Nasal Swab  Result Value Ref Range Status   SARS Coronavirus 2 by RT PCR NEGATIVE NEGATIVE Final    Comment: (NOTE) SARS-CoV-2 target nucleic acids are NOT DETECTED.  The SARS-CoV-2 RNA is generally detectable in upper respiratory specimens during the acute phase of infection. The lowest concentration of SARS-CoV-2 viral copies this assay can detect is 138 copies/mL. A negative result does not preclude SARS-Cov-2 infection and should not be used as the sole basis for treatment or other patient management decisions. A negative result may occur with  improper specimen collection/handling, submission of specimen other than nasopharyngeal swab, presence of viral mutation(s) within the areas targeted by this assay, and inadequate number of viral copies(<138 copies/mL). A negative result must be combined with clinical observations, patient history, and epidemiological information. The expected result is Negative.  Fact Sheet for Patients:  BloggerCourse.com  Fact Sheet for Healthcare Providers:  SeriousBroker.it  This test is no t yet approved or cleared by the [place] FDA and  has been authorized for detection and/or diagnosis of SARS-CoV-2 by FDA under an Emergency Use Authorization (EUA). This EUA will remain  in effect (meaning this test can be used) for the duration of the COVID-19 declaration under Section 564(b)(1) of the Act, 21 U.S.C.section 360bbb-3(b)(1), unless the authorization is terminated  or revoked sooner.       Influenza A by PCR NEGATIVE NEGATIVE Final    Influenza B by PCR NEGATIVE NEGATIVE Final    Comment: (NOTE) The Xpert Xpress SARS-CoV-2/FLU/RSV plus assay is intended as an aid in the diagnosis of influenza from Nasopharyngeal swab specimens and should not be used as a sole basis for treatment. Nasal washings and aspirates are unacceptable for Xpert Xpress SARS-CoV-2/FLU/RSV testing.  Fact Sheet for Patients: BloggerCourse.com  Fact Sheet for Healthcare Providers: SeriousBroker.it  This test is not yet approved or cleared by the [place] FDA and has been  authorized for detection and/or diagnosis of SARS-CoV-2 by FDA under an Emergency Use Authorization (EUA). This EUA will remain in effect (meaning this test can be used) for the duration of the COVID-19 declaration under Section 564(b)(1) of the Act, 21 U.S.C. section 360bbb-3(b)(1), unless the authorization is terminated or revoked.     Resp Syncytial Virus by PCR NEGATIVE NEGATIVE Final    Comment: (NOTE) Fact Sheet for Patients: BloggerCourse.com  Fact Sheet for Healthcare Providers: SeriousBroker.it  This test is not yet approved or cleared by the [place] FDA and has been authorized for detection and/or diagnosis of SARS-CoV-2 by FDA under an Emergency Use Authorization (EUA). This EUA will remain in effect (meaning this test can be used) for the duration of the COVID-19 declaration under Section 564(b)(1) of the Act, 21 U.S.C. section 360bbb-3(b)(1), unless the authorization is terminated or revoked.  Performed at [facility], [address] [place], [place] [address]   Respiratory (~20 pathogens) panel by PCR     Status: None   Collection Time: 10/16/23 11:11 AM   Specimen: Nasopharyngeal Swab; Respiratory  Result Value Ref Range Status   Adenovirus NOT DETECTED NOT DETECTED Final   Coronavirus 229E NOT DETECTED NOT DETECTED Final     Comment: (NOTE) The Coronavirus on the Respiratory Panel, DOES NOT test for the novel  Coronavirus (2019 nCoV)    Coronavirus HKU1 NOT DETECTED NOT DETECTED Final   Coronavirus NL63 NOT DETECTED NOT DETECTED Final   Coronavirus OC43 NOT DETECTED NOT DETECTED Final   Metapneumovirus NOT DETECTED NOT DETECTED Final   Rhinovirus / Enterovirus NOT DETECTED NOT DETECTED Final   Influenza A NOT DETECTED NOT DETECTED Final   Influenza B NOT DETECTED NOT DETECTED Final   Parainfluenza Virus 1 NOT DETECTED NOT DETECTED Final   Parainfluenza Virus 2 NOT DETECTED NOT DETECTED Final   Parainfluenza Virus 3 NOT DETECTED NOT DETECTED Final   Parainfluenza Virus 4 NOT DETECTED NOT DETECTED Final   Respiratory Syncytial Virus NOT DETECTED NOT DETECTED Final   Bordetella pertussis NOT DETECTED NOT DETECTED Final   Bordetella Parapertussis NOT DETECTED NOT DETECTED Final   Chlamydophila pneumoniae NOT DETECTED NOT DETECTED Final   Mycoplasma pneumoniae NOT DETECTED NOT DETECTED Final    Comment: Performed at [facility], [address] [place], [place] [address]      Radiology Studies last 3 days: CT Angio Chest Pulmonary Embolism (PE) W or WO Contrast  Result Date: 10/16/2023 CLINICAL DATA:  Two day history of shortness of breath EXAM: CT ANGIOGRAPHY CHEST WITH CONTRAST TECHNIQUE: Multidetector CT imaging of the chest was performed using the standard protocol during bolus administration of intravenous contrast. Multiplanar CT image reconstructions and MIPs were obtained to evaluate the vascular anatomy. RADIATION DOSE REDUCTION: This exam was performed according to the departmental dose-optimization program which includes automated exposure control, adjustment of the mA and/or kV according to patient size and/or use of iterative reconstruction technique. CONTRAST:  75mL OMNIPAQUE IOHEXOL 350 MG/ML SOLN COMPARISON:  Chest radiograph dated 10/16/2023, CT chest dated 05/09/2023 FINDINGS:  Cardiovascular: The study is adequate for the evaluation of pulmonary embolism. There are no filling defects in the central, lobar, segmental or subsegmental pulmonary artery branches to suggest acute pulmonary embolism. Great vessels are normal in course and caliber. Multichamber cardiomegaly. Mural hypodensity in the left ventricular apex, likely sequela of prior ischemia. No significant pericardial fluid/thickening. Coronary artery calcifications with LAD stent. Aortic atherosclerosis. Mediastinum/Nodes: Imaged thyroid gland without nodules meeting criteria for  imaging follow-up by size. Normal esophagus. No pathologically enlarged axillary, supraclavicular, mediastinal, or hilar lymph nodes. Lungs/Pleura: The central airways are patent. Adherent secretions within the trachea. Unchanged subpleural 5 mm right middle lobe nodule (7:83). No specific follow-up imaging recommended. Additional right upper lobe nodule is not well seen. Bibasilar linear atelectasis/scarring. No pneumothorax. No pleural effusion. Upper abdomen: Partially imaged TIPS and celiac artery and SMA stent. Diffuse parenchymal hypoattenuation can be seen with hepatic steatosis. Musculoskeletal: No acute or abnormal lytic or blastic osseous lesions. Multilevel degenerative changes of the thoracic spine. Similar aspherical left humeral head, possibly sequela of prior dislocation. Review of the MIP images confirms the above findings. IMPRESSION: 1. No evidence of pulmonary embolism. 2. Multichamber cardiomegaly. Mural hypodensity in the left ventricular apex, likely sequela of prior ischemia. 3. Hepatic steatosis. 4. Aortic Atherosclerosis (ICD10-I70.0). Coronary artery calcifications. Assessment for potential risk factor modification, dietary therapy or pharmacologic therapy may be warranted, if clinically indicated. Electronically Signed   By: Agustin Cree M.D.   On: 10/16/2023 13:57   DG Chest Port 1 View  Result Date: 10/16/2023 CLINICAL DATA:   Shortness of breath EXAM: PORTABLE CHEST 1 VIEW COMPARISON:  Yesterday FINDINGS: Chronic cardiopericardial enlargement. Somewhat congested appearance of central vessels. No Kerley lines, effusion, consolidation, or pneumothorax. IMPRESSION: Cardiomegaly and mild vascular congestion.  No focal pneumonia. Electronically Signed   By: Tiburcio Pea M.D.   On: 10/16/2023 06:06   DG Chest 2 View  Result Date: 10/15/2023 CLINICAL DATA:  Shortness of breath and productive cough EXAM: CHEST - 2 VIEW COMPARISON:  09/03/2023 FINDINGS: Stable heart size and mediastinal contours with coronary stent seen on the lateral view. There is no edema, consolidation, effusion, or pneumothorax. IMPRESSION: No visible pneumonia. Electronically Signed   By: Tiburcio Pea M.D.   On: 10/15/2023 05:08          Sunnie Nielsen, DO [facility] 10/17/2023, 1:40 PM    Dictation software may have been used to generate the above note. Typos may occur and escape review in typed/dictated notes. Please contact Dr Lyn Hollingshead directly for clarity if needed.  Staff may message me via secure chat in Epic  but this may not receive an immediate response,  please page me for urgent matters!  If 7PM-7AM, please contact night coverage [email or web address]

## 2023-10-17 NOTE — Plan of Care (Signed)

## 2023-10-17 NOTE — Progress Notes (Signed)
Mobility Specialist - Progress Note   Pre-mobility: HR 112, SpO2 99 During mobility: HR 124, BP, SpO2 95     10/17/23 1209  Mobility  Activity Ambulated with assistance in room;Stood at bedside  Level of Assistance Standby assist, set-up cues, supervision of patient - no hands on  Assistive Device Front wheel walker  Distance Ambulated (ft) 20 ft  Range of Motion/Exercises Active  Activity Response Tolerated fair  Mobility Referral Yes  $Mobility charge 1 Mobility  Mobility Specialist Start Time (ACUTE ONLY) 1150  Mobility Specialist Stop Time (ACUTE ONLY) 1210  Mobility Specialist Time Calculation (min) (ACUTE ONLY) 20 min   Nurse requested Mobility Specialist to perform oxygen saturation test with pt which includes removing pt from oxygen both at rest and while ambulating.  Below are the results from that testing.     Patient Saturations on Room Air at Rest = spO2 99%  Patient Saturations on Room Air while Ambulating = sp02 95% .  Rested and performed pursed lip breathing for 1 minute with sp02 at 96%.  At end of testing pt left in room air  Liters of oxygen.  Reported results to nurse.    Pt resting in bed on RA upon entry. Pt hard to understand due to soft speaking voice. Pt STS and ambulates in room (pt requested) SBA with RW. Pt HR increased to 125 after 20 feet and audible wheeze heard. Pt endorses dizziness as well. Pt returned to bed and left with needs in reach and bed alarm activated.   Johnathan Hausen Mobility Specialist 10/17/23, 12:13 PM

## 2023-10-18 ENCOUNTER — Other Ambulatory Visit: Payer: Self-pay | Admitting: Adult Health

## 2023-10-18 DIAGNOSIS — E782 Mixed hyperlipidemia: Secondary | ICD-10-CM

## 2023-10-18 DIAGNOSIS — J441 Chronic obstructive pulmonary disease with (acute) exacerbation: Secondary | ICD-10-CM | POA: Diagnosis not present

## 2023-10-18 LAB — BASIC METABOLIC PANEL
Anion gap: 10 (ref 5–15)
BUN: 14 mg/dL (ref 6–20)
CO2: 22 mmol/L (ref 22–32)
Calcium: 8.5 mg/dL — ABNORMAL LOW (ref 8.9–10.3)
Chloride: 106 mmol/L (ref 98–111)
Creatinine, Ser: 0.51 mg/dL — ABNORMAL LOW (ref 0.61–1.24)
GFR, Estimated: >60 mL/min (ref >60)
Glucose, Bld: 102 mg/dL — ABNORMAL HIGH (ref 70–99)
Potassium: 3 mmol/L — ABNORMAL LOW (ref 3.5–5.1)
Sodium: 138 mmol/L (ref 135–145)

## 2023-10-18 MED ORDER — NEBULIZER/TUBING/MOUTHPIECE KIT: PACK | 99 refills | Status: AC

## 2023-10-18 MED ORDER — FLUTICASONE FUROATE-VILANTEROL 100-25 MCG/ACT IN AEPB: 1.0000 | INHALATION_SPRAY | Freq: Every day | RESPIRATORY_TRACT | 0 refills | Status: AC

## 2023-10-18 MED ORDER — FUROSEMIDE 40 MG PO TABS
40.0000 mg | ORAL_TABLET | Freq: Every day | ORAL | 0 refills | Status: DC
Start: 2023-10-18 — End: 2024-01-14

## 2023-10-18 MED ORDER — IPRATROPIUM-ALBUTEROL 0.5-2.5 (3) MG/3ML IN SOLN: 3.0000 mL | RESPIRATORY_TRACT | 3 refills | Status: AC | PRN

## 2023-10-18 MED ORDER — PANTOPRAZOLE SODIUM 40 MG PO TBEC: 40.0000 mg | DELAYED_RELEASE_TABLET | Freq: Two times a day (BID) | ORAL | 0 refills | Status: DC

## 2023-10-18 MED ORDER — QUETIAPINE FUMARATE 50 MG PO TABS: 50.0000 mg | ORAL_TABLET | Freq: Every day | ORAL | 0 refills | Status: DC

## 2023-10-18 MED ORDER — LACTULOSE 10 GM/15ML PO SOLN: 30.0000 g | Freq: Three times a day (TID) | ORAL | 0 refills | Status: AC

## 2023-10-18 MED ORDER — ROSUVASTATIN CALCIUM 40 MG PO TABS: 40.0000 mg | ORAL_TABLET | Freq: Every day | ORAL | 0 refills | Status: DC

## 2023-10-18 MED ORDER — FUROSEMIDE 40 MG PO TABS
40.0000 mg | ORAL_TABLET | Freq: Every day | ORAL | Status: DC
  Administered 2023-10-18: 40 mg via ORAL
  Filled 2023-10-18: qty 1

## 2023-10-18 MED ORDER — METHOCARBAMOL 750 MG PO TABS: 750.0000 mg | ORAL_TABLET | Freq: Two times a day (BID) | ORAL | 0 refills | Status: DC | PRN

## 2023-10-18 MED ORDER — MIDODRINE HCL 5 MG PO TABS: 5.0000 mg | ORAL_TABLET | Freq: Three times a day (TID) | ORAL | 0 refills | Status: AC

## 2023-10-18 MED ORDER — HYDROCORTISONE 5 MG PO TABS: 5.0000 mg | ORAL_TABLET | Freq: Two times a day (BID) | ORAL | 0 refills | Status: DC

## 2023-10-18 MED ORDER — GABAPENTIN 100 MG PO CAPS: 100.0000 mg | ORAL_CAPSULE | Freq: Two times a day (BID) | ORAL | 0 refills | Status: DC

## 2023-10-18 MED ORDER — POTASSIUM CHLORIDE CRYS ER 20 MEQ PO TBCR: 20.0000 meq | EXTENDED_RELEASE_TABLET | Freq: Every day | ORAL | 0 refills | Status: DC

## 2023-10-18 MED ORDER — COMPRESSOR/NEBULIZER MISC: 0 refills | Status: AC

## 2023-10-18 NOTE — Discharge Instructions (Signed)

## 2023-10-18 NOTE — Evaluation (Addendum)
Occupational Therapy Evaluation Patient Details Name: Ethan Wells MRN: [id number] DOB: 1971-05-07 Today's Date: 10/18/2023   History of Present Illness Pt is a 52 y.o. male with medical history significant of liver cirrhosis s/p TIPS, superior mesenteric artery stenosis s/p stenting, CAD, STEMI s/p PCI with stenting, HFpEF, ischemic cardiomyopathy, prior V-fib cardiac arrest, hypertension, hyperlipidemia, GERD, migraine headaches polysubstance abuse (cocaine, alcohol, marijuana and tobacco), orthostatic hypotension, Addison's/adrenal insufficiency, chronic narcotic dependence, and recurrent GI bleeds. He has had 2 ED visits in 24 hours. He re-presents to the emergency department 10/31 morning with shortness of breath. He is also reporting midepigastric pain that radiates to his back.   Clinical Impression   Pt. Presents with weakness, limited activity tolerance, and limited functional mobility which limits the ability to complete basic ADL and IADL functioning.  Pt. Resides at home with his mother, whom he is the caregiver for. Pt. Has a ramped entrance. Pt. was independent with ADLs, and IADL functioning: including meal preparation, and medication management. Pt. Was driving. SPO2 97-98%, HR 86 bpms on RA. Pt. Requires modA LE dressing 2/2 Low back pain. Pt. Education was provided about  energy conservation with a visual hand out being provided. Pt. Will benefit from OT services for ADL training, A/E training, and pt. education about energy conservation techniques, home modification, and DME.          If plan is discharge home, recommend the following: A little help with walking and/or transfers;A little help with bathing/dressing/bathroom    Functional Status Assessment  Patient has had a recent decline in their functional status and/or demonstrates limited ability to make significant improvements in function in a reasonable and predictable amount of time  Equipment Recommendations        Recommendations for Other Services       Precautions / Restrictions Precautions Precautions: Fall Restrictions Weight Bearing Restrictions: No      Mobility Bed Mobility Overal bed mobility: Independent                  Transfers Overall transfer level: Needs assistance Equipment used: Rolling walker (2 wheels) Transfers: Sit to/from Stand Sit to Stand: Supervision                  Balance Overall balance assessment: Modified Independent, History of Falls                                         ADL either performed or assessed with clinical judgement   ADL  ModA LE ADLs 2/2 8/10 Low back pain                                             Vision Baseline Vision/History: 1 Wears glasses (For reading)       Perception         Praxis         Pertinent Vitals/Pain Pain Assessment Pain Assessment: 0-10 Pain Score: 8  Pain Location: Low back pain Pain Descriptors / Indicators: Aching, Discomfort Pain Intervention(s): Limited activity within patient's tolerance, Monitored during session     Extremity/Trunk Assessment Upper Extremity Assessment Upper Extremity Assessment: Generalized weakness   Lower Extremity Assessment Lower Extremity Assessment: Generalized weakness       Communication Communication Communication: No apparent  difficulties Cueing Techniques: Verbal cues   Cognition Arousal: Alert Behavior During Therapy: WFL for tasks assessed/performed Overall Cognitive Status: Within Functional Limits for tasks assessed                                 General Comments: AO x4; pleasant and cooperative with PT     General Comments  Pt able to maintain seated and standing static/dynamic balance with use of BUE support without LOB noted; Pt reports 1 fall event approx 1 month ago due to stepping on dog toy when helping aunt    Exercises     Shoulder Instructions      Home Living  Family/patient expects to be discharged to:: Private residence Living Arrangements: Parent Available Help at Discharge: Other (Comment);Available PRN/intermittently;Family Type of Home: House Home Access: Ramped entrance     Home Layout: One level     Bathroom Shower/Tub: Chief Strategy Officer: Standard     Home Equipment: Rollator (4 wheels);Cane - single point   Additional Comments: Pt reports not living in [place] anymore      Prior Functioning/Environment Prior Level of Function : Independent/Modified Independent;History of Falls (last six months)             Mobility Comments: Pt reports intermittent use of the rollator ADLs Comments: Independent, ADLs, cooking driving. Pt. reports that he is the caregiver for his mother, and cooks her meals.        OT Problem List: Decreased strength;Decreased range of motion;Decreased activity tolerance;Decreased knowledge of use of DME or AE;Pain      OT Treatment/Interventions: Self-care/ADL training;Therapeutic exercise;Therapeutic activities;DME and/or AE instruction    OT Goals(Current goals can be found in the care plan section) Acute Rehab OT Goals Patient Stated Goal: To return home OT Goal Formulation: With patient Time For Goal Achievement: 11/08/23 Potential to Achieve Goals: Good  OT Frequency: Min 1X/week    Co-evaluation              AM-PAC OT "6 Clicks" Daily Activity     Outcome Measure Help from another person eating meals?: None Help from another person taking care of personal grooming?: A Little Help from another person toileting, which includes using toliet, bedpan, or urinal?: A Little Help from another person bathing (including washing, rinsing, drying)?: A Little Help from another person to put on and taking off regular upper body clothing?: None Help from another person to put on and taking off regular lower body clothing?: A Little 6 Click Score: 20   End of Session Equipment  Utilized During Treatment: Gait belt  Activity Tolerance: Patient tolerated treatment well Patient left: in bed;with call bell/phone within reach;with bed alarm set  OT Visit Diagnosis: Muscle weakness (generalized) (M62.81)                Time: 6578-4696 OT Time Calculation (min): 18 min Charges:  OT General Charges $OT Visit: 1 Visit OT Evaluation $OT Eval Moderate Complexity: 1 Mod  [name], MS, OTR/L   [name] 10/18/2023, 12:55 PM

## 2023-10-18 NOTE — Discharge Summary (Signed)
Physician Discharge Summary   [name] MRN: [id number]  DOB: [birth year]   Admit:     Date of Admission: 10/16/2023 Admitted from: home   Discharge: Date of discharge: 10/18/23 Disposition: Home Condition at discharge: good  CODE STATUS: FULL CODE      Discharge Physician: Sunnie Nielsen, DO [facility]     PCP: [name], NP  Recommendations for Outpatient Follow-up:  Follow up with PCP Medina-Vargas, Monina C, NP in 1-2 weeks   Discharge Instructions     Diet - low sodium heart healthy   Complete by: As directed    Increase activity slowly   Complete by: As directed          Discharge Diagnoses: Principal Problem:   COPD exacerbation (HCC) Active Problems:   CAD (coronary artery disease)   Chronic systolic CHF (congestive heart failure) (HCC)   Alcohol use disorder   Polysubstance abuse (HCC)   Tobacco abuse   Hyperlipidemia   Hepatic cirrhosis (HCC)       HPI: Ethan Wells is a 52 y.o. male with medical history significant of liver cirrhosis s/p TIPS, superior mesenteric artery stenosis s/p stenting, CAD, STEMI s/p PCI with stenting, HFpEF, ischemic cardiomyopathy, prior V-fib cardiac arrest, hypertension, hyperlipidemia, GERD, migraine headaches polysubstance abuse (cocaine, alcohol, marijuana and tobacco), orthostatic hypotension, Addison's/adrenal insufficiency, chronic narcotic dependence, and recurrent GI bleeds.  He has had 2 ED visits in 24 hours. Day prior to this presentation reported 2-week history of productive cough with sputum that he describes as black in color, shortness of breath, nasal congestion, and was noted to be intoxicated.  He was given a DuoNeb and Tussionex with improvement in symptoms, he was monitored until clinically sober and discharged home.  He re-presents to the emergency department 10/31 morning with shortness of breath. He is also reporting midepigastric pain that  radiates to his back.   Hospital course / significant events:  10/31: admitted for COPD exacerbation, CTA chest r/o dissection/PE given reports of black mucus he's coughing up.  11/01: off O2, still significant dyspnea on exertion but O2 levels ok. Trial lasix IV and repeat walk tomorrow, will have him work w/ PT/OT, get DME set up and suspect will need home health  11/02: walking well, no concerns, not needing O2, meds refilled as needed and pt d/c home   Consultants:  none  Procedures/Surgeries: none      ASSESSMENT & PLAN:   COPD Exacerbation Recent Viral URI - LABA: Breo Ellipta - PRN SABA: Albuterol - Mucinex - Incentive Spirometry - follow outpatient    Chronic HFpEF Mild vascular congestion seen on CXR. Questionable compliance with outpatient lasix - Continue home lasix - added instructions for titration up prn symptoms    CAD Hyperlipidemia - Continue home rosuvastatin   Polysubstance Abuse ETOH Abuse Tobacco Use Disorder -Thiamine, Folic Acid - Nicotine Replacement Therapy   Hepatic Cirrhosis Portal Hypertension - Continue home lasix - Continue home Lactulose - pt has been refusing this    Hypokalemia Hypomagnesemia  - Follow up BMP, Mg outpatient   Chronic Thrombocytopenia PLT 97, d/t liver disease  - Mechanical VTE prophylaxis - Follow up CBC outpatient - Monitor for signs of bleeding   Adrenal Insufficiency Orthostatic hypotension -Continue home Hydrocortisone -Continue home midodrine with hold parameters                Discharge Instructions  Allergies as of 10/18/2023       Reactions  Penicillins Hives, Rash   Tolerated Ceftriaxone 10/2022        Medication List     TAKE these medications    albuterol 108 (90 Base) MCG/ACT inhaler Commonly known as: VENTOLIN HFA Inhale 2 puffs into the lungs every 6 (six) hours as needed for wheezing or shortness of breath.   docusate sodium 100 MG capsule Commonly known as:  COLACE Take 1 capsule (100 mg total) by mouth 2 (two) times daily as needed (if less than 3 BM"s in past 24 hours).   feeding supplement Liqd Take 237 mLs by mouth 2 (two) times daily between meals.   fluticasone furoate-vilanterol 100-25 MCG/ACT Aepb Commonly known as: BREO ELLIPTA Inhale 1 puff into the lungs daily. Start taking on: October 19, 2023   furosemide 40 MG tablet Commonly known as: Lasix Take 1 tablet (40 mg total) by mouth daily. Increase to 1 tablet (40 mg total) by mouth TWICE daily (total daily dose 80 mg) as needed for up to 3 days for increased leg swelling, shortness of breath, weight gain 5+ lbs over 1-2 days. Seek medical care if these symptoms are not improving with increased dose. What changed: additional instructions   gabapentin 100 MG capsule Commonly known as: NEURONTIN Take 1 capsule (100 mg total) by mouth 2 (two) times daily.   guaiFENesin 600 MG 12 hr tablet Commonly known as: Mucinex Take 1 tablet (600 mg total) by mouth 2 (two) times daily for 7 days.   hydrocortisone 5 MG tablet Commonly known as: CORTEF Take 1 tablet (5 mg total) by mouth 2 (two) times daily.   ipratropium-albuterol 0.5-2.5 (3) MG/3ML Soln Commonly known as: DUONEB Take 3 mLs by nebulization every 2 (two) hours as needed (wheeze, SOB).   lactulose 10 GM/15ML solution Commonly known as: CHRONULAC Take 45 mLs (30 g total) by mouth 3 (three) times daily.   methocarbamol 750 MG tablet Commonly known as: ROBAXIN Take 1 tablet (750 mg total) by mouth 2 (two) times daily as needed for muscle spasms.   midodrine 5 MG tablet Commonly known as: PROAMATINE Take 1 tablet (5 mg total) by mouth 3 (three) times daily with meals. What changed:  medication strength how much to take   oxyCODONE 5 MG immediate release tablet Commonly known as: Oxy IR/ROXICODONE Take 1 tablet (5 mg total) by mouth every 8 (eight) hours as needed for severe pain.   pantoprazole 40 MG tablet Commonly  known as: PROTONIX Take 1 tablet (40 mg total) by mouth 2 (two) times daily.   polyethylene glycol powder 17 GM/SCOOP powder Commonly known as: GLYCOLAX/MIRALAX Take 17 g by mouth daily as needed (if less than 3 BM"s in past 24 hours).   potassium chloride SA 20 MEQ tablet Commonly known as: KLOR-CON M Take 1 tablet (20 mEq total) by mouth daily.   QUEtiapine 50 MG tablet Commonly known as: SEROQUEL Take 1 tablet (50 mg total) by mouth at bedtime.   rosuvastatin 40 MG tablet Commonly known as: CRESTOR Take 1 tablet (40 mg total) by mouth daily.               Durable Medical Equipment  (From admission, onward)           Start     Ordered   10/17/23 1116  For home use only DME Nebulizer machine  Once       Question Answer Comment  Patient needs a nebulizer to treat with the following condition COPD (chronic obstructive pulmonary disease) (  HCC)   Length of Need Lifetime      10/17/23 1115              Allergies  Allergen Reactions   Penicillins Hives and Rash    Tolerated Ceftriaxone 10/2022     Subjective: pt reports no concerns this morning, denies CP/SOB, no abd pain, ambulating, tolerating diet   Discharge Exam: BP (!) 102/55 (BP Location: Left Arm)   Pulse 89   Temp 97.8 F (36.6 C) (Oral)   Resp 18   Ht 5\' 6"  (1.676 m)   Wt 90 kg   SpO2 98%   BMI 32.02 kg/m  General: Pt is alert, awake, not in acute distress Cardiovascular: RRR, S1/S2 +, no rubs, no gallops Respiratory: CTA bilaterally, no wheezing, no rhonchi Abdominal: Soft, NT, ND, bowel sounds + Extremities: no edema, no cyanosis     The results of significant diagnostics from this hospitalization (including imaging, microbiology, ancillary and laboratory) are listed below for reference.     Microbiology: Recent Results (from the past 240 hour(s))  Resp panel by RT-PCR (RSV, Flu A&B, Covid) Anterior Nasal Swab     Status: None   Collection Time: 10/15/23  3:04 AM    Specimen: Anterior Nasal Swab  Result Value Ref Range Status   SARS Coronavirus 2 by RT PCR NEGATIVE NEGATIVE Final    Comment: (NOTE) SARS-CoV-2 target nucleic acids are NOT DETECTED.  The SARS-CoV-2 RNA is generally detectable in upper respiratory specimens during the acute phase of infection. The lowest concentration of SARS-CoV-2 viral copies this assay can detect is 138 copies/mL. A negative result does not preclude SARS-Cov-2 infection and should not be used as the sole basis for treatment or other patient management decisions. A negative result may occur with  improper specimen collection/handling, submission of specimen other than nasopharyngeal swab, presence of viral mutation(s) within the areas targeted by this assay, and inadequate number of viral copies(<138 copies/mL). A negative result must be combined with clinical observations, patient history, and epidemiological information. The expected result is Negative.  Fact Sheet for Patients:  BloggerCourse.com  Fact Sheet for Healthcare Providers:  SeriousBroker.it  This test is no t yet approved or cleared by the [place] FDA and  has been authorized for detection and/or diagnosis of SARS-CoV-2 by FDA under an Emergency Use Authorization (EUA). This EUA will remain  in effect (meaning this test can be used) for the duration of the COVID-19 declaration under Section 564(b)(1) of the Act, 21 U.S.C.section 360bbb-3(b)(1), unless the authorization is terminated  or revoked sooner.       Influenza A by PCR NEGATIVE NEGATIVE Final   Influenza B by PCR NEGATIVE NEGATIVE Final    Comment: (NOTE) The Xpert Xpress SARS-CoV-2/FLU/RSV plus assay is intended as an aid in the diagnosis of influenza from Nasopharyngeal swab specimens and should not be used as a sole basis for treatment. Nasal washings and aspirates are unacceptable for Xpert Xpress  SARS-CoV-2/FLU/RSV testing.  Fact Sheet for Patients: BloggerCourse.com  Fact Sheet for Healthcare Providers: SeriousBroker.it  This test is not yet approved or cleared by the [place] FDA and has been authorized for detection and/or diagnosis of SARS-CoV-2 by FDA under an Emergency Use Authorization (EUA). This EUA will remain in effect (meaning this test can be used) for the duration of the COVID-19 declaration under Section 564(b)(1) of the Act, 21 U.S.C. section 360bbb-3(b)(1), unless the authorization is terminated or revoked.     Resp Syncytial Virus by PCR  NEGATIVE NEGATIVE Final    Comment: (NOTE) Fact Sheet for Patients: BloggerCourse.com  Fact Sheet for Healthcare Providers: SeriousBroker.it  This test is not yet approved or cleared by the [place] FDA and has been authorized for detection and/or diagnosis of SARS-CoV-2 by FDA under an Emergency Use Authorization (EUA). This EUA will remain in effect (meaning this test can be used) for the duration of the COVID-19 declaration under Section 564(b)(1) of the Act, 21 U.S.C. section 360bbb-3(b)(1), unless the authorization is terminated or revoked.  Performed at [facility], [address] [place], [place] [address]   Respiratory (~20 pathogens) panel by PCR     Status: None   Collection Time: 10/16/23 11:11 AM   Specimen: Nasopharyngeal Swab; Respiratory  Result Value Ref Range Status   Adenovirus NOT DETECTED NOT DETECTED Final   Coronavirus 229E NOT DETECTED NOT DETECTED Final    Comment: (NOTE) The Coronavirus on the Respiratory Panel, DOES NOT test for the novel  Coronavirus (2019 nCoV)    Coronavirus HKU1 NOT DETECTED NOT DETECTED Final   Coronavirus NL63 NOT DETECTED NOT DETECTED Final   Coronavirus OC43 NOT DETECTED NOT DETECTED Final   Metapneumovirus NOT DETECTED NOT DETECTED Final    Rhinovirus / Enterovirus NOT DETECTED NOT DETECTED Final   Influenza A NOT DETECTED NOT DETECTED Final   Influenza B NOT DETECTED NOT DETECTED Final   Parainfluenza Virus 1 NOT DETECTED NOT DETECTED Final   Parainfluenza Virus 2 NOT DETECTED NOT DETECTED Final   Parainfluenza Virus 3 NOT DETECTED NOT DETECTED Final   Parainfluenza Virus 4 NOT DETECTED NOT DETECTED Final   Respiratory Syncytial Virus NOT DETECTED NOT DETECTED Final   Bordetella pertussis NOT DETECTED NOT DETECTED Final   Bordetella Parapertussis NOT DETECTED NOT DETECTED Final   Chlamydophila pneumoniae NOT DETECTED NOT DETECTED Final   Mycoplasma pneumoniae NOT DETECTED NOT DETECTED Final    Comment: Performed at [facility], [address] [place], [place] [address]  Expectorated Sputum Assessment w Gram Stain, Rflx to Resp Cult     Status: None   Collection Time: 10/16/23 12:19 PM   Specimen: Sputum  Result Value Ref Range Status   Specimen Description SPUTUM  Final   Special Requests NONE  Final   Sputum evaluation   Final    THIS SPECIMEN IS ACCEPTABLE FOR SPUTUM CULTURE Performed at [facility], [address] [place], [place] [address]    Report Status 10/17/2023 FINAL  Final  Culture, Respiratory w Gram Stain     Status: None (Preliminary result)   Collection Time: 10/16/23 12:19 PM   Specimen: SPU  Result Value Ref Range Status   Specimen Description   Final    SPUTUM Performed at [facility], [address] [place], [place] [address]    Special Requests   Final    NONE Reflexed from [id number] Performed at [facility], [address] [place], [place] [address]    Gram Stain   Final    FEW SQUAMOUS EPITHELIAL CELLS PRESENT ABUNDANT WBC PRESENT, PREDOMINANTLY PMN ABUNDANT GRAM POSITIVE COCCI Performed at [facility], [address] [place], [place] [address]    Culture PENDING  Incomplete   Report Status PENDING  Incomplete     Labs: BNP (last 3  results) Recent Labs    06/30/23 1604 08/08/23 1433 09/03/23 0445  BNP 234.1* 217.7* 466.7*   Basic Metabolic Panel: Recent Labs  Lab 10/15/23 0304 10/16/23 0428 10/17/23 2440  10/18/23 0429  NA 139 139 134* 138  K 3.5 3.1* 3.4* 3.0*  CL 107 107 104 106  CO2 21* 21* 21* 22  GLUCOSE 108* 101* 149* 102*  BUN <5* 6 9 14   CREATININE 0.42* 0.48* 0.49* 0.51*  CALCIUM 8.4* 8.4* 8.3* 8.5*  MG  --   --  1.4*  --    Liver Function Tests: Recent Labs  Lab 10/17/23 0643  AST 96*  ALT 25  ALKPHOS 195*  BILITOT 6.3*  PROT 6.9  ALBUMIN 3.1*   No results for input(s): "LIPASE", "AMYLASE" in the last 168 hours. No results for input(s): "AMMONIA" in the last 168 hours. CBC: Recent Labs  Lab 10/15/23 0304 10/16/23 0428 10/17/23 0643  WBC 7.7 5.6 6.7  NEUTROABS  --  3.2  --   HGB 11.3* 10.1* 9.4*  HCT 35.0* 30.6* 28.8*  MCV 86.8 86.0 88.9  PLT 109* 97* 69*   Cardiac Enzymes: No results for input(s): "CKTOTAL", "CKMB", "CKMBINDEX", "TROPONINI" in the last 168 hours. BNP: Invalid input(s): "POCBNP" CBG: No results for input(s): "GLUCAP" in the last 168 hours. D-Dimer No results for input(s): "DDIMER" in the last 72 hours. Hgb A1c No results for input(s): "HGBA1C" in the last 72 hours. Lipid Profile No results for input(s): "CHOL", "HDL", "LDLCALC", "TRIG", "CHOLHDL", "LDLDIRECT" in the last 72 hours. Thyroid function studies No results for input(s): "TSH", "T4TOTAL", "T3FREE", "THYROIDAB" in the last 72 hours.  Invalid input(s): "FREET3" Anemia work up No results for input(s): "VITAMINB12", "FOLATE", "FERRITIN", "TIBC", "IRON", "RETICCTPCT" in the last 72 hours. Urinalysis    Component Value Date/Time   COLORURINE STRAW (A) 09/03/2023 0457   APPEARANCEUR CLEAR (A) 09/03/2023 0457   APPEARANCEUR Clear 03/16/2012 1315   LABSPEC 1.005 09/03/2023 0457   LABSPEC 1.013 03/16/2012 1315   PHURINE 6.0 09/03/2023 0457   GLUCOSEU NEGATIVE 09/03/2023 0457   GLUCOSEU 50  mg/dL 96/29/5284 1324   HGBUR NEGATIVE 09/03/2023 0457   BILIRUBINUR NEGATIVE 09/03/2023 0457   BILIRUBINUR Negative 03/16/2012 1315   KETONESUR NEGATIVE 09/03/2023 0457   PROTEINUR NEGATIVE 09/03/2023 0457   NITRITE NEGATIVE 09/03/2023 0457   LEUKOCYTESUR TRACE (A) 09/03/2023 0457   LEUKOCYTESUR Negative 03/16/2012 1315   Sepsis Labs Recent Labs  Lab 10/15/23 0304 10/16/23 0428 10/17/23 0643  WBC 7.7 5.6 6.7   Microbiology Recent Results (from the past 240 hour(s))  Resp panel by RT-PCR (RSV, Flu A&B, Covid) Anterior Nasal Swab     Status: None   Collection Time: 10/15/23  3:04 AM   Specimen: Anterior Nasal Swab  Result Value Ref Range Status   SARS Coronavirus 2 by RT PCR NEGATIVE NEGATIVE Final    Comment: (NOTE) SARS-CoV-2 target nucleic acids are NOT DETECTED.  The SARS-CoV-2 RNA is generally detectable in upper respiratory specimens during the acute phase of infection. The lowest concentration of SARS-CoV-2 viral copies this assay can detect is 138 copies/mL. A negative result does not preclude SARS-Cov-2 infection and should not be used as the sole basis for treatment or other patient management decisions. A negative result may occur with  improper specimen collection/handling, submission of specimen other than nasopharyngeal swab, presence of viral mutation(s) within the areas targeted by this assay, and inadequate number of viral copies(<138 copies/mL). A negative result must be combined with clinical observations, patient history, and epidemiological information. The expected result is Negative.  Fact Sheet for Patients:  BloggerCourse.com  Fact Sheet for Healthcare Providers:  SeriousBroker.it  This test is no t yet approved or  cleared by the [place] and  has been authorized for detection and/or diagnosis of SARS-CoV-2 by FDA under an Emergency Use Authorization (EUA). This EUA will remain  in  effect (meaning this test can be used) for the duration of the COVID-19 declaration under Section 564(b)(1) of the Act, 21 U.S.C.section 360bbb-3(b)(1), unless the authorization is terminated  or revoked sooner.       Influenza A by PCR NEGATIVE NEGATIVE Final   Influenza B by PCR NEGATIVE NEGATIVE Final    Comment: (NOTE) The Xpert Xpress SARS-CoV-2/FLU/RSV plus assay is intended as an aid in the diagnosis of influenza from Nasopharyngeal swab specimens and should not be used as a sole basis for treatment. Nasal washings and aspirates are unacceptable for Xpert Xpress SARS-CoV-2/FLU/RSV testing.  Fact Sheet for Patients: BloggerCourse.com  Fact Sheet for Healthcare Providers: SeriousBroker.it  This test is not yet approved or cleared by the [place] FDA and has been authorized for detection and/or diagnosis of SARS-CoV-2 by FDA under an Emergency Use Authorization (EUA). This EUA will remain in effect (meaning this test can be used) for the duration of the COVID-19 declaration under Section 564(b)(1) of the Act, 21 U.S.C. section 360bbb-3(b)(1), unless the authorization is terminated or revoked.     Resp Syncytial Virus by PCR NEGATIVE NEGATIVE Final    Comment: (NOTE) Fact Sheet for Patients: BloggerCourse.com  Fact Sheet for Healthcare Providers: SeriousBroker.it  This test is not yet approved or cleared by the [place] FDA and has been authorized for detection and/or diagnosis of SARS-CoV-2 by FDA under an Emergency Use Authorization (EUA). This EUA will remain in effect (meaning this test can be used) for the duration of the COVID-19 declaration under Section 564(b)(1) of the Act, 21 U.S.C. section 360bbb-3(b)(1), unless the authorization is terminated or revoked.  Performed at [facility], [address] [place], [place] [address]    Respiratory (~20 pathogens) panel by PCR     Status: None   Collection Time: 10/16/23 11:11 AM   Specimen: Nasopharyngeal Swab; Respiratory  Result Value Ref Range Status   Adenovirus NOT DETECTED NOT DETECTED Final   Coronavirus 229E NOT DETECTED NOT DETECTED Final    Comment: (NOTE) The Coronavirus on the Respiratory Panel, DOES NOT test for the novel  Coronavirus (2019 nCoV)    Coronavirus HKU1 NOT DETECTED NOT DETECTED Final   Coronavirus NL63 NOT DETECTED NOT DETECTED Final   Coronavirus OC43 NOT DETECTED NOT DETECTED Final   Metapneumovirus NOT DETECTED NOT DETECTED Final   Rhinovirus / Enterovirus NOT DETECTED NOT DETECTED Final   Influenza A NOT DETECTED NOT DETECTED Final   Influenza B NOT DETECTED NOT DETECTED Final   Parainfluenza Virus 1 NOT DETECTED NOT DETECTED Final   Parainfluenza Virus 2 NOT DETECTED NOT DETECTED Final   Parainfluenza Virus 3 NOT DETECTED NOT DETECTED Final   Parainfluenza Virus 4 NOT DETECTED NOT DETECTED Final   Respiratory Syncytial Virus NOT DETECTED NOT DETECTED Final   Bordetella pertussis NOT DETECTED NOT DETECTED Final   Bordetella Parapertussis NOT DETECTED NOT DETECTED Final   Chlamydophila pneumoniae NOT DETECTED NOT DETECTED Final   Mycoplasma pneumoniae NOT DETECTED NOT DETECTED Final    Comment: Performed at [facility], [address], [place], [place] [address]  Expectorated Sputum Assessment w Gram Stain, Rflx to Resp Cult     Status: None   Collection Time: 10/16/23 12:19 PM   Specimen: Sputum  Result Value Ref Range Status   Specimen  Description SPUTUM  Final   Special Requests NONE  Final   Sputum evaluation   Final    THIS SPECIMEN IS ACCEPTABLE FOR SPUTUM CULTURE Performed at [facility], [address] [place], [place] [address]    Report Status 10/17/2023 FINAL  Final  Culture, Respiratory w Gram Stain     Status: None (Preliminary result)   Collection Time: 10/16/23 12:19 PM   Specimen: SPU   Result Value Ref Range Status   Specimen Description   Final    SPUTUM Performed at [facility], [address] [place], [place] [address]    Special Requests   Final    NONE Reflexed from [id number] Performed at [facility], [address] [place], [place] [address]    Gram Stain   Final    FEW SQUAMOUS EPITHELIAL CELLS PRESENT ABUNDANT WBC PRESENT, PREDOMINANTLY PMN ABUNDANT GRAM POSITIVE COCCI Performed at [facility], [address], [place], [place] [address]    Culture PENDING  Incomplete   Report Status PENDING  Incomplete   Imaging CT Angio Chest Pulmonary Embolism (PE) W or WO Contrast  Result Date: 10/16/2023 CLINICAL DATA:  Two day history of shortness of breath EXAM: CT ANGIOGRAPHY CHEST WITH CONTRAST TECHNIQUE: Multidetector CT imaging of the chest was performed using the standard protocol during bolus administration of intravenous contrast. Multiplanar CT image reconstructions and MIPs were obtained to evaluate the vascular anatomy. RADIATION DOSE REDUCTION: This exam was performed according to the departmental dose-optimization program which includes automated exposure control, adjustment of the mA and/or kV according to patient size and/or use of iterative reconstruction technique. CONTRAST:  75mL OMNIPAQUE IOHEXOL 350 MG/ML SOLN COMPARISON:  Chest radiograph dated 10/16/2023, CT chest dated 05/09/2023 FINDINGS: Cardiovascular: The study is adequate for the evaluation of pulmonary embolism. There are no filling defects in the central, lobar, segmental or subsegmental pulmonary artery branches to suggest acute pulmonary embolism. Great vessels are normal in course and caliber. Multichamber cardiomegaly. Mural hypodensity in the left ventricular apex, likely sequela of prior ischemia. No significant pericardial fluid/thickening. Coronary artery calcifications with LAD stent. Aortic atherosclerosis. Mediastinum/Nodes: Imaged thyroid gland without  nodules meeting criteria for imaging follow-up by size. Normal esophagus. No pathologically enlarged axillary, supraclavicular, mediastinal, or hilar lymph nodes. Lungs/Pleura: The central airways are patent. Adherent secretions within the trachea. Unchanged subpleural 5 mm right middle lobe nodule (7:83). No specific follow-up imaging recommended. Additional right upper lobe nodule is not well seen. Bibasilar linear atelectasis/scarring. No pneumothorax. No pleural effusion. Upper abdomen: Partially imaged TIPS and celiac artery and SMA stent. Diffuse parenchymal hypoattenuation can be seen with hepatic steatosis. Musculoskeletal: No acute or abnormal lytic or blastic osseous lesions. Multilevel degenerative changes of the thoracic spine. Similar aspherical left humeral head, possibly sequela of prior dislocation. Review of the MIP images confirms the above findings. IMPRESSION: 1. No evidence of pulmonary embolism. 2. Multichamber cardiomegaly. Mural hypodensity in the left ventricular apex, likely sequela of prior ischemia. 3. Hepatic steatosis. 4. Aortic Atherosclerosis (ICD10-I70.0). Coronary artery calcifications. Assessment for potential risk factor modification, dietary therapy or pharmacologic therapy may be warranted, if clinically indicated. Electronically Signed   By: Agustin Cree M.D.   On: 10/16/2023 13:57   DG Chest Port 1 View  Result Date: 10/16/2023 CLINICAL DATA:  Shortness of breath EXAM: PORTABLE CHEST 1 VIEW COMPARISON:  Yesterday FINDINGS: Chronic cardiopericardial enlargement. Somewhat congested appearance of central vessels. No Kerley lines, effusion, consolidation, or pneumothorax. IMPRESSION: Cardiomegaly and mild vascular  congestion.  No focal pneumonia. Electronically Signed   By: Tiburcio Pea M.D.   On: 10/16/2023 06:06      Time coordinating discharge: over 30 minutes  SIGNED:  Sunnie Nielsen DO [facility]

## 2023-10-18 NOTE — TOC Transition Note (Signed)
Transition of Care [facility]) - CM/SW Discharge Note   Patient Details  Name: Ethan Wells MRN: [id number] Date of Birth: 03-12-1971  Transition of Care [facility]) CM/SW Contact:  Luvenia Redden, RN Phone Number: 10/18/2023, 2:37 PM   Clinical Narrative:    Spoke with pt concerning discharge disposition. Pt was provided a taxi voucher and discharged however order were placed in on yesterday for nebulizer that was ordered. RN TOC spoke with pt and inquired if this is an item that can be brought to his place of residence his mother ([address] [place] [address]) pt agreed and pt receptive to any DME agency. RN TOC called [employer] with this orders and they will deliver directly to the pt's mother's home today. RN TOC also requested the Attending to order the medications to the requested pharmacy [facility].   Pt updated on medications at pharmacy and nebulizer will be delivered to his mother's home as noted above.   RN TOC also educated on substance abuse and added resources to his AVS for access.  No other needs provided at this time.     Final next level of care: Home/Self Care Barriers to Discharge: No Barriers Identified   Patient Goals and CMS Choice      Discharge Placement                      Patient and family notified of of transfer: 10/18/23  Discharge Plan and Services Additional resources added to the After Visit Summary for                  DME Arranged: N/A DME Agency: NA       HH Arranged: NA HH Agency: NA        Social Determinants of Health (SDOH) Interventions SDOH Screenings   Food Insecurity: No Food Insecurity (10/16/2023)  Housing: Patient Unable To Answer (10/16/2023)  Transportation Needs: No Transportation Needs (10/16/2023)  Recent Concern: Transportation Needs - Unmet Transportation Needs (09/03/2023)  Utilities: Not At Risk (10/16/2023)  Alcohol Screen: Medium Risk (10/01/2022)  Tobacco Use: High Risk (10/16/2023)      Readmission Risk Interventions    10/16/2023   12:56 PM 08/11/2023   10:35 AM 02/24/2023    3:12 PM  Readmission Risk Prevention Plan  Transportation Screening Complete Complete Complete  Medication Review Oceanographer) Complete Complete Complete  PCP or Specialist appointment within 3-5 days of discharge Complete Complete   HRI or Home Care Consult Patient refused    SW Recovery Care/Counseling Consult Patient refused Complete   Palliative Care Screening Patient Refused Not Applicable Not Applicable  Skilled Nursing Facility Patient Refused Not Applicable Not Applicable

## 2023-10-18 NOTE — Plan of Care (Signed)
  Problem: Safety: Goal: Ability to remain free from injury will improve Outcome: Progressing   Problem: Pain Management: Goal: General experience of comfort will improve Outcome: Progressing

## 2023-10-18 NOTE — Evaluation (Signed)
Physical Therapy Evaluation Patient Details Name: Ethan Wells MRN: [id number] DOB: November 29, 1971 Today's Date: 10/18/2023  History of Present Illness  Pt is a 52 y.o. male with medical history significant of liver cirrhosis s/p TIPS, superior mesenteric artery stenosis s/p stenting, CAD, STEMI s/p PCI with stenting, HFpEF, ischemic cardiomyopathy, prior V-fib cardiac arrest, hypertension, hyperlipidemia, GERD, migraine headaches polysubstance abuse (cocaine, alcohol, marijuana and tobacco), orthostatic hypotension, Addison's/adrenal insufficiency, chronic narcotic dependence, and recurrent GI bleeds. He has had 2 ED visits in 24 hours. He re-presents to the emergency department 10/31 morning with shortness of breath. He is also reporting midepigastric pain that radiates to his back.  Clinical Impression  Pt is received in bed, he is agreeable to PT session. At baseline, Pt reports being independent with ADL/IADLs and use of rollator intermittently with amb. Pt performs bed mobility, transfers, and amb supA for safety with use of RW. Pt able to amb approx 25 ft using RW with mild gait impairments which Pt reports is due to "tiredness"; unable to increase amb distance at this time secondary to Pt's request to lay back in bed to take a nap. Plans to increase amb distance next visit and consider discharging Pt from PT services. Pt would benefit from skilled PT to address above deficits and promote optimal return to PLOF.      If plan is discharge home, recommend the following:     Can travel by private vehicle        Equipment Recommendations None recommended by PT  Recommendations for Other Services       Functional Status Assessment Patient has had a recent decline in their functional status and demonstrates the ability to make significant improvements in function in a reasonable and predictable amount of time.     Precautions / Restrictions Precautions Precautions:  Fall Restrictions Weight Bearing Restrictions: No      Mobility  Bed Mobility Overal bed mobility: Independent             General bed mobility comments: Pt able to perform bed mobility independently with no cuing required    Transfers Overall transfer level: Needs assistance Equipment used: Rolling walker (2 wheels) Transfers: Sit to/from Stand Sit to Stand: Supervision           General transfer comment: Pt able to perform STS supA for safety but no cuing required for safe technique    Ambulation/Gait Ambulation/Gait assistance: Supervision Gait Distance (Feet): 25 Feet Assistive device: Rolling walker (2 wheels) Gait Pattern/deviations: Step-through pattern, Decreased stride length, Shuffle Gait velocity: Decreased     General Gait Details: Pt able to amb approx 25 ft using RW with slight shuffling gait; Pt reports he is tired and legs feel heavy as to why it's hard for him to lift legs  Stairs            Wheelchair Mobility     Tilt Bed    Modified Rankin (Stroke Patients Only)       Balance Overall balance assessment: Modified Independent, History of Falls                                           Pertinent Vitals/Pain Pain Assessment Pain Assessment: 0-10 Pain Score: 7  Pain Location: low back Pain Descriptors / Indicators: Aching, Discomfort Pain Intervention(s): Limited activity within patient's tolerance    Home Living Family/patient expects to  be discharged to:: Private residence Living Arrangements: Parent Available Help at Discharge: Other (Comment);Available PRN/intermittently;Family (Pt reports he helps mother; Pt reports cousin can help him) Type of Home: House Home Access: Ramped entrance       Home Layout: One level Home Equipment: Rollator (4 wheels);Cane - single point Additional Comments: Pt reports not living in truk anymore    Prior Function Prior Level of Function : Independent/Modified  Independent;History of Falls (last six months)             Mobility Comments: Pt reports intermittent use of the rollator ADLs Comments: Independently     Extremity/Trunk Assessment   Upper Extremity Assessment Upper Extremity Assessment: Defer to OT evaluation    Lower Extremity Assessment Lower Extremity Assessment: Generalized weakness       Communication   Communication Communication: No apparent difficulties (slurred speech at baseline) Cueing Techniques: Verbal cues  Cognition Arousal: Alert Behavior During Therapy: WFL for tasks assessed/performed Overall Cognitive Status: Within Functional Limits for tasks assessed                                 General Comments: AO x4; pleasant and cooperative with PT        General Comments General comments (skin integrity, edema, etc.): Pt able to maintain seated and standing static/dynamic balance with use of BUE support without LOB noted; Pt reports 1 fall event approx 1 month ago due to stepping on dog toy when helping aunt    Exercises     Assessment/Plan    PT Assessment Patient needs continued PT services  PT Problem List Decreased strength;Decreased mobility;Decreased activity tolerance;Pain       PT Treatment Interventions DME instruction;Therapeutic exercise;Gait training;Functional mobility training;Therapeutic activities    PT Goals (Current goals can be found in the Care Plan section)  Acute Rehab PT Goals Patient Stated Goal: to go home PT Goal Formulation: With patient Time For Goal Achievement: 11/01/23 Potential to Achieve Goals: Good    Frequency Min 1X/week     Co-evaluation               AM-PAC PT "6 Clicks" Mobility  Outcome Measure Help needed turning from your back to your side while in a flat bed without using bedrails?: None Help needed moving from lying on your back to sitting on the side of a flat bed without using bedrails?: None Help needed moving to and  from a bed to a chair (including a wheelchair)?: None Help needed standing up from a chair using your arms (e.g., wheelchair or bedside chair)?: None Help needed to walk in hospital room?: None Help needed climbing 3-5 steps with a railing? : A Little 6 Click Score: 23    End of Session   Activity Tolerance: Patient limited by lethargy Patient left: in bed;with call bell/phone within reach;with bed alarm set Nurse Communication: Mobility status PT Visit Diagnosis: Unsteadiness on feet (R26.81);Muscle weakness (generalized) (M62.81);Pain Pain - Right/Left:  (bilateral) Pain - part of body:  (low back)    Time: 6213-0865 PT Time Calculation (min) (ACUTE ONLY): 12 min   Charges:                 Elmon Else, SPT   Neidra Girvan 10/18/2023, 11:09 AM

## 2023-10-19 NOTE — TOC Transition Note (Signed)
Transition of Care [facility]) - CM/SW Discharge Note   Patient Details  Name: Ethan Wells MRN: [id number] Date of Birth: [birth year]  Transition of Care [facility]) CM/SW Contact:  Luvenia Redden, RN Phone Number: 10/19/2023, 9:40 AM   Clinical Narrative:     Pt called indicating he did not receive the delivery of his nebulizer from [employer] yesterday. RN TOC contacted [employer] who indicated the order for delivery was delayed to Monday. RN TOC requested once again for the delivery of the pt's nebulizer to be delivered today.  RN TOC also requested a call back when the item was delivered.  RN TOC updated pt on the delivery scheduled for today and to be present not to delay the delivery of this DME.  Followed up with Ada at [employer] who confirmed delivery was in route to the pt's residence for the delivery of the nebulizer for this pt.   No further needs as pt states he is not in distress at this time.   Final next level of care: Home/Self Care Barriers to Discharge: No Barriers Identified   Patient Goals and CMS Choice      Discharge Placement                      Patient and family notified of of transfer: 10/18/23  Discharge Plan and Services Additional resources added to the After Visit Summary for                  DME Arranged: N/A DME Agency: NA       HH Arranged: NA HH Agency: NA        Social Determinants of Health (SDOH) Interventions SDOH Screenings   Food Insecurity: No Food Insecurity (10/16/2023)  Housing: Patient Unable To Answer (10/16/2023)  Transportation Needs: No Transportation Needs (10/16/2023)  Recent Concern: Transportation Needs - Unmet Transportation Needs (09/03/2023)  Utilities: Not At Risk (10/16/2023)  Alcohol Screen: Medium Risk (10/01/2022)  Tobacco Use: High Risk (10/16/2023)     Readmission Risk Interventions    10/16/2023   12:56 PM 08/11/2023   10:35 AM 02/24/2023    3:12 PM  Readmission Risk Prevention Plan   Transportation Screening Complete Complete Complete  Medication Review Oceanographer) Complete Complete Complete  PCP or Specialist appointment within 3-5 days of discharge Complete Complete   HRI or Home Care Consult Patient refused    SW Recovery Care/Counseling Consult Patient refused Complete   Palliative Care Screening Patient Refused Not Applicable Not Applicable  Skilled Nursing Facility Patient Refused Not Applicable Not Applicable

## 2023-10-20 ENCOUNTER — Telehealth: Payer: Self-pay | Admitting: *Deleted

## 2023-10-20 LAB — CULTURE, RESPIRATORY W GRAM STAIN: Culture: NORMAL

## 2023-10-20 NOTE — Telephone Encounter (Signed)
Patient was given 30 tablets on last refill. Medication pend and sent to PCP Medina-Vargas, Margit Banda, NP

## 2023-10-20 NOTE — Transitions of Care (Post Inpatient/ED Visit) (Signed)
   10/20/2023  Name: Ethan Wells MRN: [id number] DOB: [birth year]  Today's TOC FU Call Status: Today's TOC FU Call Status:: Unsuccessful Call (1st Attempt) Unsuccessful Call (1st Attempt) Date: 10/20/23  Attempted to reach the patient regarding the most recent Inpatient/ED visit. Patient was coughing and could not hear. He said he would talk later Follow Up Plan: Additional outreach attempts will be made to reach the patient to complete the Transitions of Care (Post Inpatient/ED visit) call.   Gean Maidens BSN RN [facility] [phone]

## 2023-10-21 ENCOUNTER — Telehealth: Payer: Self-pay

## 2023-10-21 NOTE — Transitions of Care (Post Inpatient/ED Visit) (Signed)
   10/21/2023  Name: Ethan Wells MRN: [id number] DOB: 17-May-1971  Today's TOC FU Call Status: Today's TOC FU Call Status:: Unsuccessful Call (2nd Attempt) Unsuccessful Call (2nd Attempt) Date: 10/21/23  Attempted to reach the patient regarding the most recent Inpatient/ED visit.  Follow Up Plan: Additional outreach attempts will be made to reach the patient to complete the Transitions of Care (Post Inpatient/ED visit) call.      Antionette Fairy, RN,BSN,CCM RN Care Manager Transitions of Care  [place]  Direct Phone: [phone] Toll Free: [phone] Fax: [phone]

## 2023-10-21 NOTE — Transitions of Care (Post Inpatient/ED Visit) (Signed)
   10/21/2023  Name: ALEXSANDER CAVINS MRN: [id number] DOB: 30-Dec-1970  Today's TOC FU Call Status: Today's TOC FU Call Status:: Unsuccessful Call (3rd Attempt) Unsuccessful Call (3rd Attempt) Date: 10/21/23  Attempted to reach the patient regarding the most recent Inpatient/ED visit.  Follow Up Plan: No further outreach attempts will be made at this time. We have been unable to contact the patient.    Antionette Fairy, RN,BSN,CCM RN Care Manager Transitions of Care  [place]  Direct Phone: [phone] Toll Free: [phone] Fax: [phone]

## 2023-10-22 ENCOUNTER — Other Ambulatory Visit: Payer: Self-pay | Admitting: Adult Health

## 2023-10-22 DIAGNOSIS — E782 Mixed hyperlipidemia: Secondary | ICD-10-CM

## 2023-10-27 ENCOUNTER — Emergency Department
Admission: EM | Admit: 2023-10-27 | Discharge: 2023-10-27 | Disposition: A | Discharge status: Home / Self Care | Payer: Medicare HMO | Attending: Student in an Organized Health Care Education/Training Program | Admitting: Student in an Organized Health Care Education/Training Program

## 2023-10-27 ENCOUNTER — Emergency Department: Payer: Medicare HMO

## 2023-10-27 DIAGNOSIS — I517 Cardiomegaly: Secondary | ICD-10-CM | POA: Diagnosis not present

## 2023-10-27 DIAGNOSIS — R079 Chest pain, unspecified: Secondary | ICD-10-CM | POA: Diagnosis not present

## 2023-10-27 DIAGNOSIS — R0789 Other chest pain: Secondary | ICD-10-CM | POA: Insufficient documentation

## 2023-10-27 LAB — HEPATIC FUNCTION PANEL
ALT: 24 U/L (ref 0–44)
AST: 69 U/L — ABNORMAL HIGH (ref 15–41)
Albumin: 3.1 g/dL — ABNORMAL LOW (ref 3.5–5.0)
Alkaline Phosphatase: 178 U/L — ABNORMAL HIGH (ref 38–126)
Bilirubin, Direct: 1.1 mg/dL — ABNORMAL HIGH (ref 0.0–0.2)
Indirect Bilirubin: 1.2 mg/dL — ABNORMAL HIGH (ref 0.3–0.9)
Total Bilirubin: 2.3 mg/dL — ABNORMAL HIGH (ref <1.2)
Total Protein: 6.7 g/dL (ref 6.5–8.1)

## 2023-10-27 LAB — BASIC METABOLIC PANEL
Anion gap: 8 (ref 5–15)
BUN: 5 mg/dL — ABNORMAL LOW (ref 6–20)
CO2: 19 mmol/L — ABNORMAL LOW (ref 22–32)
Calcium: 8.5 mg/dL — ABNORMAL LOW (ref 8.9–10.3)
Chloride: 113 mmol/L — ABNORMAL HIGH (ref 98–111)
Creatinine, Ser: 0.6 mg/dL — ABNORMAL LOW (ref 0.61–1.24)
GFR, Estimated: >60 mL/min (ref >60)
Glucose, Bld: 116 mg/dL — ABNORMAL HIGH (ref 70–99)
Potassium: 3.1 mmol/L — ABNORMAL LOW (ref 3.5–5.1)
Sodium: 140 mmol/L (ref 135–145)

## 2023-10-27 LAB — CBC
HCT: 29.4 % — ABNORMAL LOW (ref 39.0–52.0)
Hemoglobin: 9.4 g/dL — ABNORMAL LOW (ref 13.0–17.0)
MCH: 28.7 pg (ref 26.0–34.0)
MCHC: 32 g/dL (ref 30.0–36.0)
MCV: 89.6 fL (ref 80.0–100.0)
Platelets: 113 10*3/uL — ABNORMAL LOW (ref 150–400)
RBC: 3.28 MIL/uL — ABNORMAL LOW (ref 4.22–5.81)
RDW: 19 % — ABNORMAL HIGH (ref 11.5–15.5)
WBC: 4.6 10*3/uL (ref 4.0–10.5)
nRBC: 0 % (ref 0.0–0.2)

## 2023-10-27 LAB — LIPASE, BLOOD: Lipase: 46 U/L (ref 11–51)

## 2023-10-27 LAB — TROPONIN I (HIGH SENSITIVITY)
Troponin I (High Sensitivity): 14 ng/L (ref <18)
Troponin I (High Sensitivity): 14 ng/L (ref <18)

## 2023-10-27 MED ORDER — ALUM & MAG HYDROXIDE-SIMETH 200-200-20 MG/5ML PO SUSP
30.0000 mL | Freq: Once | ORAL | Status: AC
  Administered 2023-10-27: 30 mL via ORAL
  Filled 2023-10-27: qty 30

## 2023-10-27 MED ORDER — POTASSIUM CHLORIDE CRYS ER 20 MEQ PO TBCR
40.0000 meq | EXTENDED_RELEASE_TABLET | Freq: Once | ORAL | Status: AC
  Administered 2023-10-27: 40 meq via ORAL
  Filled 2023-10-27: qty 2

## 2023-10-27 MED ORDER — THIAMINE HCL 100 MG PO TABS
100.0000 mg | ORAL_TABLET | Freq: Once | ORAL | Status: AC
  Administered 2023-10-27: 100 mg via ORAL
  Filled 2023-10-27 (×2): qty 1

## 2023-10-27 NOTE — ED Provider Notes (Signed)
[facility] Provider Note    Event Date/Time   First MD Initiated Contact with Patient 10/27/23 [phone]     (approximate)   History   Chest Pain   HPI  Ethan Wells is a 52 y.o. male extensive history of alcohol dependence presents to the ER for evaluation of chest pain abdominal pain tingling in his fingers.  The symptoms are daily.  States he does continue to drink alcohol.  Denies any shortness of breath no pleuritic chest pain.  No worsening swelling.  No fevers or chills.  No melena or hematochezia.  No emesis or hematemesis.     Physical Exam   Triage Vital Signs: ED Triage Vitals  Encounter Vitals Group     BP 10/27/23 0626 (!) 101/58     Systolic BP Percentile --      Diastolic BP Percentile --      Pulse Rate 10/27/23 0626 95     Resp 10/27/23 0626 18     Temp 10/27/23 0626 97.8 F (36.6 C)     Temp Source 10/27/23 0626 Oral     SpO2 10/27/23 0623 99 %     Weight 10/27/23 0628 200 lb 9.9 oz (91 kg)     Height 10/27/23 0628 5\' 6"  (1.676 m)     Head Circumference --      Peak Flow --      Pain Score 10/27/23 0627 8     Pain Loc --      Pain Education --      Exclude from Growth Chart --     Most recent vital signs: Vitals:   10/27/23 0800 10/27/23 0900  BP: 113/69 102/67  Pulse: 83 84  Resp: 14 14  Temp:    SpO2: 97% 99%     Constitutional: Alert  Eyes: Conjunctivae are normal.  Head: Atraumatic. Nose: No congestion/rhinnorhea. Mouth/Throat: Mucous membranes are moist.   Neck: Painless ROM.  Cardiovascular:   Good peripheral circulation. Respiratory: Normal respiratory effort.  No retractions.  Gastrointestinal: Soft and nontender.  Musculoskeletal:  no deformity Neurologic:  MAE spontaneously. No gross focal neurologic deficits are appreciated.  Skin:  Skin is warm, dry and intact. No rash noted. Psychiatric: Mood and affect are normal. Speech and behavior are normal.    ED Results / Procedures / Treatments    Labs (all labs ordered are listed, but only abnormal results are displayed) Labs Reviewed  BASIC METABOLIC PANEL - Abnormal; Notable for the following components:      Result Value   Potassium 3.1 (*)    Chloride 113 (*)    CO2 19 (*)    Glucose, Bld 116 (*)    BUN 5 (*)    Creatinine, Ser 0.60 (*)    Calcium 8.5 (*)    All other components within normal limits  CBC - Abnormal; Notable for the following components:   RBC 3.28 (*)    Hemoglobin 9.4 (*)    HCT 29.4 (*)    RDW 19.0 (*)    Platelets 113 (*)    All other components within normal limits  HEPATIC FUNCTION PANEL - Abnormal; Notable for the following components:   Albumin 3.1 (*)    AST 69 (*)    Alkaline Phosphatase 178 (*)    Total Bilirubin 2.3 (*)    Bilirubin, Direct 1.1 (*)    Indirect Bilirubin 1.2 (*)    All other components within normal limits  LIPASE, BLOOD  TROPONIN  I (HIGH SENSITIVITY)  TROPONIN I (HIGH SENSITIVITY)     EKG  ED ECG REPORT I, Willy Eddy, the attending physician, personally viewed and interpreted this ECG.   Date: 10/27/2023  EKG Time: 6:28  Rate: 95  Rhythm: sinus  Axis: normal  Intervals: normal  ST&T Change: no stmei    RADIOLOGY Please see ED Course for my review and interpretation.  I personally reviewed all radiographic images ordered to evaluate for the above acute complaints and reviewed radiology reports and findings.  These findings were personally discussed with the patient.  Please see medical record for radiology report.    PROCEDURES:  Critical Care performed: No  Procedures   MEDICATIONS ORDERED IN ED: Medications  thiamine (VITAMIN B1) tablet 100 mg (100 mg Oral Given 10/27/23 0817)  alum & mag hydroxide-simeth (MAALOX/MYLANTA) 200-200-20 MG/5ML suspension 30 mL (30 mLs Oral Given 10/27/23 0817)  potassium chloride SA (KLOR-CON M) CR tablet 40 mEq (40 mEq Oral Given 10/27/23 0817)     IMPRESSION / MDM / ASSESSMENT AND PLAN / ED COURSE   I reviewed the triage vital signs and the nursing notes.                              Differential diagnosis includes, but is not limited to, ACS, pericarditis, esophagitis, boerhaaves, pe, dissection, pna, bronchitis, costochondritis  Patient presenting to the ER for evaluation of symptoms as described above.  Based on symptoms, risk factors and considered above differential, this presenting complaint could reflect a potentially life-threatening illness therefore the patient will be placed on continuous pulse oximetry and telemetry for monitoring.  Laboratory evaluation will be sent to evaluate for the above complaints.  Exam reassuring. Non toxic.  Will observe for blood work and radiographs.  Suspect much of symptoms 2/2 chronic alcohol abuse and dependence.  No sign of withdrawal.  Low suspicion for pe/dissection.    Clinical Course as of 10/27/23 1435  Mon Oct 27, 2023  1610 Patient reassessed.  Remains hemodynamically stable.  Blood work at baseline.  Not consistent with ACS.  Not consistent with dissection.  Abdominal exam is soft and benign.  Lipase normal.  LFTs  improved compared to prior.  Do not feel that further diagnostic testing clinically indicated based on patient's presentation.  Will be given referral to substance abuse treatment.  Not showing signs of withdrawal.  He is appropriate for outpatient follow-up. [PR]    Clinical Course User Index [PR] Willy Eddy, MD     FINAL CLINICAL IMPRESSION(S) / ED DIAGNOSES   Final diagnoses:  Atypical chest pain     Rx / DC Orders   ED Discharge Orders     None        Note:  This document was prepared using Dragon voice recognition software and may include unintentional dictation errors.    Willy Eddy, MD 10/27/23 [phone]

## 2023-10-27 NOTE — ED Triage Notes (Signed)
Pt arrived from home BIB [facility] for generalized CP x2 days with SOB & N/V. Pt also endorsees continues to drink last ETOH use last night. Pt does have extensive cardiac hx. EMS administer ASA and nitroglycerin spray, did give any relief to CP.  Pt A&O x4 VSS, NSR on EKG per EMS.

## 2023-10-27 NOTE — ED Notes (Signed)
Blue and Red top lab tubes sent to lab for any additional add on

## 2023-11-14 ENCOUNTER — Emergency Department
Admission: EM | Admit: 2023-11-14 | Discharge: 2023-11-14 | Disposition: A | Discharge status: Home / Self Care | Payer: Medicare HMO | Attending: Emergency Medicine | Admitting: Emergency Medicine

## 2023-11-14 ENCOUNTER — Encounter: Payer: Self-pay | Admitting: Medical Oncology

## 2023-11-14 ENCOUNTER — Emergency Department: Payer: Medicare HMO

## 2023-11-14 ENCOUNTER — Other Ambulatory Visit: Payer: Self-pay

## 2023-11-14 DIAGNOSIS — R509 Fever, unspecified: Secondary | ICD-10-CM | POA: Diagnosis not present

## 2023-11-14 DIAGNOSIS — M549 Dorsalgia, unspecified: Secondary | ICD-10-CM | POA: Diagnosis not present

## 2023-11-14 DIAGNOSIS — L03114 Cellulitis of left upper limb: Secondary | ICD-10-CM | POA: Insufficient documentation

## 2023-11-14 DIAGNOSIS — Y902 Blood alcohol level of 40-59 mg/100 ml: Secondary | ICD-10-CM | POA: Insufficient documentation

## 2023-11-14 DIAGNOSIS — I10 Essential (primary) hypertension: Secondary | ICD-10-CM | POA: Insufficient documentation

## 2023-11-14 DIAGNOSIS — I2510 Atherosclerotic heart disease of native coronary artery without angina pectoris: Secondary | ICD-10-CM | POA: Insufficient documentation

## 2023-11-14 DIAGNOSIS — Z955 Presence of coronary angioplasty implant and graft: Secondary | ICD-10-CM | POA: Insufficient documentation

## 2023-11-14 DIAGNOSIS — Z8674 Personal history of sudden cardiac arrest: Secondary | ICD-10-CM | POA: Insufficient documentation

## 2023-11-14 DIAGNOSIS — F1011 Alcohol abuse, in remission: Secondary | ICD-10-CM | POA: Insufficient documentation

## 2023-11-14 DIAGNOSIS — F17200 Nicotine dependence, unspecified, uncomplicated: Secondary | ICD-10-CM | POA: Insufficient documentation

## 2023-11-14 DIAGNOSIS — J449 Chronic obstructive pulmonary disease, unspecified: Secondary | ICD-10-CM | POA: Insufficient documentation

## 2023-11-14 DIAGNOSIS — F1411 Cocaine abuse, in remission: Secondary | ICD-10-CM | POA: Insufficient documentation

## 2023-11-14 DIAGNOSIS — R079 Chest pain, unspecified: Secondary | ICD-10-CM | POA: Diagnosis present

## 2023-11-14 DIAGNOSIS — Z20822 Contact with and (suspected) exposure to covid-19: Secondary | ICD-10-CM | POA: Insufficient documentation

## 2023-11-14 DIAGNOSIS — R0789 Other chest pain: Secondary | ICD-10-CM | POA: Insufficient documentation

## 2023-11-14 DIAGNOSIS — I5032 Chronic diastolic (congestive) heart failure: Secondary | ICD-10-CM | POA: Insufficient documentation

## 2023-11-14 DIAGNOSIS — Z7951 Long term (current) use of inhaled steroids: Secondary | ICD-10-CM | POA: Insufficient documentation

## 2023-11-14 DIAGNOSIS — I517 Cardiomegaly: Secondary | ICD-10-CM | POA: Diagnosis not present

## 2023-11-14 DIAGNOSIS — R1110 Vomiting, unspecified: Secondary | ICD-10-CM | POA: Diagnosis not present

## 2023-11-14 DIAGNOSIS — I110 Hypertensive heart disease with heart failure: Secondary | ICD-10-CM | POA: Insufficient documentation

## 2023-11-14 DIAGNOSIS — E876 Hypokalemia: Secondary | ICD-10-CM | POA: Insufficient documentation

## 2023-11-14 LAB — COMPREHENSIVE METABOLIC PANEL
ALT: 20 U/L (ref 0–44)
AST: 67 U/L — ABNORMAL HIGH (ref 15–41)
Albumin: 3.3 g/dL — ABNORMAL LOW (ref 3.5–5.0)
Alkaline Phosphatase: 197 U/L — ABNORMAL HIGH (ref 38–126)
Anion gap: 10 (ref 5–15)
BUN: 6 mg/dL (ref 6–20)
CO2: 22 mmol/L (ref 22–32)
Calcium: 8.6 mg/dL — ABNORMAL LOW (ref 8.9–10.3)
Chloride: 104 mmol/L (ref 98–111)
Creatinine, Ser: 0.61 mg/dL (ref 0.61–1.24)
GFR, Estimated: >60 mL/min (ref >60)
Glucose, Bld: 125 mg/dL — ABNORMAL HIGH (ref 70–99)
Potassium: 3.2 mmol/L — ABNORMAL LOW (ref 3.5–5.1)
Sodium: 136 mmol/L (ref 135–145)
Total Bilirubin: 3.6 mg/dL — ABNORMAL HIGH (ref <1.2)
Total Protein: 7.2 g/dL (ref 6.5–8.1)

## 2023-11-14 LAB — CBC WITH DIFFERENTIAL/PLATELET
Abs Immature Granulocytes: 0.01 10*3/uL (ref 0.00–0.07)
Basophils Absolute: 0 10*3/uL (ref 0.0–0.1)
Basophils Relative: 1 %
Eosinophils Absolute: 0 10*3/uL (ref 0.0–0.5)
Eosinophils Relative: 1 %
HCT: 29.6 % — ABNORMAL LOW (ref 39.0–52.0)
Hemoglobin: 9.7 g/dL — ABNORMAL LOW (ref 13.0–17.0)
Immature Granulocytes: 0 %
Lymphocytes Relative: 13 %
Lymphs Abs: 0.4 10*3/uL — ABNORMAL LOW (ref 0.7–4.0)
MCH: 29.7 pg (ref 26.0–34.0)
MCHC: 32.8 g/dL (ref 30.0–36.0)
MCV: 90.5 fL (ref 80.0–100.0)
Monocytes Absolute: 0.6 10*3/uL (ref 0.1–1.0)
Monocytes Relative: 20 %
Neutro Abs: 2 10*3/uL (ref 1.7–7.7)
Neutrophils Relative %: 65 %
Platelets: 90 10*3/uL — ABNORMAL LOW (ref 150–400)
RBC: 3.27 MIL/uL — ABNORMAL LOW (ref 4.22–5.81)
RDW: 19.4 % — ABNORMAL HIGH (ref 11.5–15.5)
WBC: 3 10*3/uL — ABNORMAL LOW (ref 4.0–10.5)
nRBC: 0 % (ref 0.0–0.2)

## 2023-11-14 LAB — RESP PANEL BY RT-PCR (RSV, FLU A&B, COVID)  RVPGX2
Influenza A by PCR: NEGATIVE
Influenza B by PCR: NEGATIVE
Resp Syncytial Virus by PCR: NEGATIVE
SARS Coronavirus 2 by RT PCR: NEGATIVE

## 2023-11-14 LAB — LIPASE, BLOOD: Lipase: 46 U/L (ref 11–51)

## 2023-11-14 LAB — ETHANOL: Alcohol, Ethyl (B): 50 mg/dL — ABNORMAL HIGH (ref <10)

## 2023-11-14 LAB — TROPONIN I (HIGH SENSITIVITY)
Troponin I (High Sensitivity): 18 ng/L — ABNORMAL HIGH (ref <18)
Troponin I (High Sensitivity): 17 ng/L (ref <18)

## 2023-11-14 MED ORDER — DOXYCYCLINE HYCLATE 100 MG IV SOLR
100.0000 mg | Freq: Once | INTRAVENOUS | Status: AC
  Administered 2023-11-14: 100 mg via INTRAVENOUS
  Filled 2023-11-14: qty 100

## 2023-11-14 MED ORDER — IOHEXOL 350 MG/ML SOLN
100.0000 mL | Freq: Once | INTRAVENOUS | Status: AC | PRN
  Administered 2023-11-14: 100 mL via INTRAVENOUS

## 2023-11-14 MED ORDER — KETOROLAC TROMETHAMINE 15 MG/ML IJ SOLN
15.0000 mg | Freq: Once | INTRAMUSCULAR | Status: AC
  Administered 2023-11-14: 15 mg via INTRAVENOUS
  Filled 2023-11-14: qty 1

## 2023-11-14 MED ORDER — MORPHINE SULFATE (PF) 4 MG/ML IV SOLN
4.0000 mg | Freq: Once | INTRAVENOUS | Status: AC
  Administered 2023-11-14: 4 mg via INTRAVENOUS
  Filled 2023-11-14: qty 1

## 2023-11-14 MED ORDER — ONDANSETRON HCL 4 MG/2ML IJ SOLN
4.0000 mg | Freq: Once | INTRAMUSCULAR | Status: AC
  Administered 2023-11-14: 4 mg via INTRAVENOUS
  Filled 2023-11-14: qty 2

## 2023-11-14 MED ORDER — POTASSIUM CHLORIDE CRYS ER 20 MEQ PO TBCR
40.0000 meq | EXTENDED_RELEASE_TABLET | Freq: Once | ORAL | Status: AC
  Administered 2023-11-14: 40 meq via ORAL
  Filled 2023-11-14: qty 2

## 2023-11-14 MED ORDER — DOXYCYCLINE HYCLATE 100 MG PO CAPS: 100.0000 mg | ORAL_CAPSULE | Freq: Two times a day (BID) | ORAL | 0 refills | Status: DC

## 2023-11-14 MED ORDER — OXYCODONE-ACETAMINOPHEN 5-325 MG PO TABS
1.0000 | ORAL_TABLET | Freq: Once | ORAL | Status: AC
  Administered 2023-11-14: 1 via ORAL
  Filled 2023-11-14: qty 1

## 2023-11-14 NOTE — ED Provider Notes (Signed)
[facility] Provider Note    Event Date/Time   First MD Initiated Contact with Patient 11/14/23 1045     (approximate)   History   Chief Complaint: Chest Pain and Emesis   HPI  Ethan Wells is a 52 y.o. male with a history of polysubstance abuse including cocaine, GI bleed, hypertension, CAD, prior V-fib arrest, coronary stents who comes to the ED complaining of chest pain.  He was recently evaluated in the ED overnight, had reassuring workup with labs chest x-ray EKG and was discharged this morning.  On his way out of the hospital, he turned around and reported vomiting and recurrent chest pain.  Currently he is describes the chest pain as being tightness, abrupt onset yesterday morning waking him up from sleep, radiating to his back and jaw and right arm.  No shortness of breath cough or diaphoresis  Nursing reports patient was observed in the lobby to be spitting saliva into a bag but no vomiting.          Physical Exam   Triage Vital Signs: ED Triage Vitals [11/14/23 1039]  Encounter Vitals Group     BP (!) 125/49     Systolic BP Percentile      Diastolic BP Percentile      Pulse Rate (!) 101     Resp 18     Temp 98.2 F (36.8 C)     Temp Source Oral     SpO2 99 %     Weight 197 lb 15.6 oz (89.8 kg)     Height 5\' 5"  (1.651 m)     Head Circumference      Peak Flow      Pain Score 10     Pain Loc      Pain Education      Exclude from Growth Chart     Most recent vital signs: Vitals:   11/14/23 1300 11/14/23 1330  BP: 118/60 128/68  Pulse: 96 98  Resp: 19 18  Temp:    SpO2: 98% 98%    General: Awake, no distress.  CV:  Good peripheral perfusion.  Regular rate and rhythm Resp:  Normal effort.  Clear to auscultation bilaterally Abd:  No distention.  Soft nontender Other:  No lower extremity edema or calf tenderness.  Symmetric calf circumference.  Small area of ecchymosis on the left palm with raised keratinized  tissue   ED Results / Procedures / Treatments   Labs (all labs ordered are listed, but only abnormal results are displayed) Labs Reviewed - No data to display   EKG    RADIOLOGY CT chest abdomen pelvis interpreted by me, negative for dissection.  Radiology report reviewed   PROCEDURES:  Procedures   MEDICATIONS ORDERED IN ED: Medications  ondansetron (ZOFRAN) injection 4 mg (4 mg Intravenous Given 11/14/23 1215)  ketorolac (TORADOL) 15 MG/ML injection 15 mg (15 mg Intravenous Given 11/14/23 1215)  iohexol (OMNIPAQUE) 350 MG/ML injection 100 mL (100 mLs Intravenous Contrast Given 11/14/23 1220)     IMPRESSION / MDM / ASSESSMENT AND PLAN / ED COURSE  I reviewed the triage vital signs and the nursing notes.  DDx: Aortic dissection, pericarditis, malingering, pneumothorax  Patient's presentation is most consistent with acute presentation with potential threat to life or bodily function.  Patient presents with chest pain after being discharged from the ED within the last hour.  He is nontoxic, appears stable on exam.  Will obtain CT angiogram of the chest  abdomen pelvis to look for aortic or mediastinal pathology    Clinical Course as of 11/14/23 1428  Fri Nov 14, 2023  1321 CTA CAP negative for acute findings. Suspect GERD. Stable for DC and outpatient follow up. [PS]    Clinical Course User Index [PS] Sharman Cheek, MD     FINAL CLINICAL IMPRESSION(S) / ED DIAGNOSES   Final diagnoses:  Atypical chest pain     Rx / DC Orders   ED Discharge Orders     None        Note:  This document was prepared using Dragon voice recognition software and may include unintentional dictation errors.   Sharman Cheek, MD 11/14/23 [phone]

## 2023-11-14 NOTE — ED Notes (Signed)
Pt repositioned and given pillow. Call light within reach.

## 2023-11-14 NOTE — ED Notes (Signed)
Pt to CT

## 2023-11-14 NOTE — ED Triage Notes (Signed)
Pt from home via [facility] with reports of central chest pain that woke him from sleep. Pt also concerned about a spider bite that occurred in 1995 and occasionally causes him to have pain to left hand.

## 2023-11-14 NOTE — Discharge Instructions (Addendum)
Your tests today were all reassuring. Take doxycycline as prescribed for the infection in your hand.

## 2023-11-14 NOTE — ED Notes (Signed)
IV team at bedside 

## 2023-11-14 NOTE — ED Provider Notes (Signed)
[facility] Provider Note    Event Date/Time   First MD Initiated Contact with Patient 11/14/23 0448     (approximate)   History   Chest Pain   HPI  Ethan Wells is a 52 y.o. male brought to the ED via EMS from home with a chief complaint of chest pain and left hand pain.  Patient with a history of CAD, cocaine abuse, alcohol abuse, hypertension, ischemic cardiomyopathy, status post V-fib cardiac arrest in 2012 who was awakened from sleep with central chest pressure.  Given 4 baby aspirin by EMS prior to arrival.  Also complains of left hand pain.  States he was bitten by a spider in 1995 to his left palm and occasionally has flareups of pain.  Endorses lesion to left palm with streaking towards his wrist.  EMS reports low-grade temperature.  Denies recent fever/chills, cough, shortness of breath, abdominal pain, nausea, vomiting or dizziness.     Past Medical History   Past Medical History:  Diagnosis Date   CAD (coronary artery disease)    a. 01/2011 Anterior STEMI/Cath/PCI: LM nl, LAD 100d (3.5x42mm Vision BMS placed), LCX 16m, RI 50, RCA min irregs, EF 40% w/ apical, inferoapical HK; b. 10/2022 NSTSEMI/Cath: LM nl, LAD 5p/m, RI 85, LCX 155m, OM1 mild dzs, OM3 100 fills via L->L collats from dLAD, RCA 80p, 152m, RPDA fills via collats from LAD. EF 45-50%-->Med Rx.   Cardiac arrest - ventricular fibrillation    a. In setting of STEMI 01/2011   Chronic pain    Cocaine abuse (HCC)    Depression with anxiety    ETOH abuse    a. 6-12 beers/day   GERD (gastroesophageal reflux disease)    GI bleed    Hemorrhoids    Hyperlipidemia    Hypertension    Ischemic cardiomyopathy    a. 06/2011 Echo: EF 45-50%, No rwma; b. 10/2022 Echo: EF 55-60%, no rwma, nl RV fxn.   Marijuana abuse    Migraines    Tobacco abuse    a. 1/2 ppd x 26 yrs     Active Problem List   Patient Active Problem List   Diagnosis Date Noted   COPD exacerbation (HCC) 10/16/2023    Hepatic cirrhosis (HCC) 09/03/2023   Acute metabolic encephalopathy 08/11/2023   Acute blood loss anemia (ABLA) 08/08/2023   Elevated liver function tests 07/02/2023   Pancytopenia (HCC) 07/01/2023   Metabolic acidosis 07/01/2023   Liver mass 06/30/2023   Nausea vomiting and diarrhea 06/30/2023   Migraine 06/30/2023   Cholecystitis, acute 05/10/2023   UTI (urinary tract infection) 05/05/2023   Chronic diastolic CHF (congestive heart failure) (HCC) 05/05/2023   Alcohol abuse 05/05/2023   Alcoholic cirrhosis (HCC) 04/12/2023   Esophageal varices with bleeding in diseases classified elsewhere (HCC) 04/09/2023   Hepatic encephalopathy (HCC) 04/09/2023   Secondary esophageal varices with bleeding (HCC) 04/08/2023   Upper GI bleeding 04/07/2023   Leg edema 03/01/2023   GI bleeding 02/22/2023   HLD (hyperlipidemia) 02/22/2023   Adrenal insufficiency (HCC) 02/22/2023   Alcoholic liver disease (HCC) 02/22/2023   Upper GI bleed 02/22/2023   Myocardial injury 01/29/2023   Adrenal insufficiency (Addison's disease) (HCC) 01/28/2023   Reactive thrombocytosis 01/24/2023   Hypophosphatemia 01/22/2023   Acute hypoxic respiratory failure (HCC) 01/20/2023   Orthostatic hypotension 01/17/2023   Anasarca 01/17/2023   Hemorrhagic shock (HCC) 01/17/2023   Obesity (BMI 30-39.9) 01/17/2023   Cocaine dependence with cocaine-induced disorder (HCC) 01/17/2023   Rectal bleeding  01/17/2023   Peripheral edema 01/17/2023   History of benzodiazepine use 01/17/2023   Chest pain 11/19/2022   Acute on chronic heart failure with preserved ejection fraction (HFpEF) (HCC) 10/29/2022   Acute respiratory distress 10/29/2022   Elevated rheumatoid factor 10/28/2022   Hyperkalemia 10/25/2022   Hemorrhoids, internal, with bleeding 10/25/2022   Mesenteric artery stenosis (HCC) 10/23/2022   Iron deficiency anemia 10/23/2022   Hematochezia    Abnormal CT scan, colon    Colitis 09/30/2022   Prolonged QT interval  09/30/2022   Alcohol use disorder 08/19/2022   GI bleed 05/28/2022   Non-intractable vomiting    Epigastric pain    Polyp of ascending colon    Anemia due to GI blood loss 08/07/2021   Acute gastritis    Hyperbilirubinemia    Elevated troponin    Notalgia    History of ST elevation myocardial infarction (STEMI) 12/19/2020   Alcohol dependence (HCC) 12/19/2020   Depression with anxiety 10/05/2020   Esophageal reflux 10/05/2020   Headache 10/05/2020   Hyperlipidemia 10/05/2020   Hyponatremia 06/04/2020   Hypomagnesemia 06/04/2020   Chronic systolic CHF (congestive heart failure) (HCC) 06/04/2020   Leukocytosis 06/04/2020   Abdominal pain 06/04/2020   Hypokalemia 06/04/2020   Ischemic cardiomyopathy    Polysubstance abuse (HCC)    NSTEMI (non-ST elevated myocardial infarction) (HCC) 07/12/2019   Abdominal pain, RUQ 05/25/2018   Chest pain, mid sternal 11/17/2012   Cocaine abuse (HCC)    Marijuana abuse    Tobacco abuse 02/26/2011   CAD (coronary artery disease) 02/26/2011   Other specified forms of chronic ischemic heart disease 02/26/2011     Past Surgical History   Past Surgical History:  Procedure Laterality Date   COLONOSCOPY  08/09/2021   Procedure: COLONOSCOPY;  Surgeon: Midge Minium, MD;  Location: [facility] ENDOSCOPY;  Service: Endoscopy;;   CORONARY ANGIOPLASTY WITH STENT PLACEMENT     ESOPHAGEAL BANDING  04/08/2023   Procedure: ESOPHAGEAL BANDING;  Surgeon: Tressia Danas, MD;  Location: [facility] ENDOSCOPY;  Service: Gastroenterology;;   ESOPHAGOGASTRODUODENOSCOPY N/A 05/30/2022   Procedure: ESOPHAGOGASTRODUODENOSCOPY (EGD);  Surgeon: Toney Reil, MD;  Location: [facility] ENDOSCOPY;  Service: Gastroenterology;  Laterality: N/A;   ESOPHAGOGASTRODUODENOSCOPY (EGD) WITH PROPOFOL N/A 03/01/2021   Procedure: ESOPHAGOGASTRODUODENOSCOPY (EGD) WITH PROPOFOL;  Surgeon: Toney Reil, MD;  Location: [facility]GERY [facility];  Service: Endoscopy;  Laterality: N/A;    ESOPHAGOGASTRODUODENOSCOPY (EGD) WITH PROPOFOL N/A 08/09/2021   Procedure: ESOPHAGOGASTRODUODENOSCOPY (EGD) WITH PROPOFOL;  Surgeon: Midge Minium, MD;  Location: [facility] ENDOSCOPY;  Service: Endoscopy;  Laterality: N/A;   ESOPHAGOGASTRODUODENOSCOPY (EGD) WITH PROPOFOL N/A 03/01/2023   Procedure: ESOPHAGOGASTRODUODENOSCOPY (EGD) WITH PROPOFOL;  Surgeon: Jaynie Collins, DO;  Location: [facility] ENDOSCOPY;  Service: Gastroenterology;  Laterality: N/A;   ESOPHAGOGASTRODUODENOSCOPY (EGD) WITH PROPOFOL N/A 04/08/2023   Procedure: ESOPHAGOGASTRODUODENOSCOPY (EGD) WITH PROPOFOL;  Surgeon: Tressia Danas, MD;  Location: [facility] ENDOSCOPY;  Service: Gastroenterology;  Laterality: N/A;   ESOPHAGOGASTRODUODENOSCOPY (EGD) WITH PROPOFOL N/A 08/09/2023   Procedure: ESOPHAGOGASTRODUODENOSCOPY (EGD) WITH PROPOFOL;  Surgeon: Regis Bill, MD;  Location: [facility] ENDOSCOPY;  Service: Endoscopy;  Laterality: N/A;   ESOPHAGOGASTRODUODENOSCOPY (EGD) WITH PROPOFOL N/A 08/19/2023   Procedure: ESOPHAGOGASTRODUODENOSCOPY (EGD) WITH PROPOFOL;  Surgeon: Regis Bill, MD;  Location: [facility] ENDOSCOPY;  Service: Endoscopy;  Laterality: N/A;   EVALUATION UNDER ANESTHESIA WITH HEMORRHOIDECTOMY N/A 10/31/2022   Procedure: EXAM UNDER ANESTHESIA WITH SUTURE LIGATION OF TWO BLEEDING PEDICLES;  Surgeon: Henrene Dodge, MD;  Location: [facility] ORS;  Service: General;  Laterality: N/A;   FLEXIBLE SIGMOIDOSCOPY  N/A 10/02/2022   Procedure: FLEXIBLE SIGMOIDOSCOPY;  Surgeon: Meryl Dare, MD;  Location: [facility] ENDOSCOPY;  Service: Gastroenterology;  Laterality: N/A;   HEMOSTASIS CONTROL  08/09/2023   Procedure: HEMOSTASIS CONTROL;  Surgeon: Regis Bill, MD;  Location: [facility] ENDOSCOPY;  Service: Endoscopy;;   IR TIPS  04/11/2023   IR US GUIDE VASC ACCESS RIGHT  04/11/2023   IR US GUIDE VASC ACCESS RIGHT  04/11/2023   IR US GUIDE VASC ACCESS RIGHT  04/11/2023   LEFT HEART CATH AND CORONARY ANGIOGRAPHY N/A 11/04/2022   Procedure: LEFT HEART CATH  AND CORONARY ANGIOGRAPHY;  Surgeon: Iran Ouch, MD;  Location: [facility] INVASIVE CV LAB;  Service: Cardiovascular;  Laterality: N/A;   RADIOLOGY WITH ANESTHESIA N/A 04/11/2023   Procedure: IR WITH ANESTHESIA;  Surgeon: Oley Balm, MD;  Location: [facility] OR;  Service: Radiology;  Laterality: N/A;   VISCERAL ANGIOGRAPHY N/A 10/24/2022   Procedure: VISCERAL ANGIOGRAPHY;  Surgeon: Annice Needy, MD;  Location: [facility] INVASIVE CV LAB;  Service: Cardiovascular;  Laterality: N/A;   VISCERAL ARTERY INTERVENTION N/A 05/09/2023   Procedure: VISCERAL ARTERY INTERVENTION;  Surgeon: Renford Dills, MD;  Location: [facility] INVASIVE CV LAB;  Service: Cardiovascular;  Laterality: N/A;     Home Medications   Prior to Admission medications   Medication Sig Start Date End Date Taking? Authorizing Provider  albuterol (VENTOLIN HFA) 108 (90 Base) MCG/ACT inhaler Inhale 2 puffs into the lungs every 6 (six) hours as needed for wheezing or shortness of breath. 10/15/23   Chesley Noon, MD  docusate sodium (COLACE) 100 MG capsule Take 1 capsule (100 mg total) by mouth 2 (two) times daily as needed (if less than 3 BM"s in past 24 hours). 08/25/23   Pennie Banter, DO  feeding supplement (ENSURE ENLIVE / ENSURE PLUS) LIQD Take 237 mLs by mouth 2 (two) times daily between meals. 08/25/23   Esaw Grandchild A, DO  fluticasone furoate-vilanterol (BREO ELLIPTA) 100-25 MCG/ACT AEPB Inhale 1 puff into the lungs daily. 10/19/23   Sunnie Nielsen, DO  furosemide (LASIX) 40 MG tablet Take 1 tablet (40 mg total) by mouth daily. Increase to 1 tablet (40 mg total) by mouth TWICE daily (total daily dose 80 mg) as needed for up to 3 days for increased leg swelling, shortness of breath, weight gain 5+ lbs over 1-2 days. Seek medical care if these symptoms are not improving with increased dose. 10/18/23 12/17/23  Sunnie Nielsen, DO  gabapentin (NEURONTIN) 100 MG capsule Take 1 capsule (100 mg total) by mouth 2 (two) times daily. 10/18/23  11/17/23  Sunnie Nielsen, DO  hydrocortisone (CORTEF) 5 MG tablet Take 1 tablet (5 mg total) by mouth 2 (two) times daily. 10/18/23   Sunnie Nielsen, DO  ipratropium-albuterol (DUONEB) 0.5-2.5 (3) MG/3ML SOLN Take 3 mLs by nebulization every 2 (two) hours as needed (wheeze, SOB). 10/18/23   Sunnie Nielsen, DO  lactulose (CHRONULAC) 10 GM/15ML solution Take 45 mLs (30 g total) by mouth 3 (three) times daily. 10/18/23 11/17/23  Sunnie Nielsen, DO  methocarbamol (ROBAXIN) 750 MG tablet Take 1 tablet (750 mg total) by mouth 2 (two) times daily as needed for muscle spasms. 10/18/23   Sunnie Nielsen, DO  midodrine (PROAMATINE) 5 MG tablet Take 1 tablet (5 mg total) by mouth 3 (three) times daily with meals. 10/18/23   Sunnie Nielsen, DO  Nebulizers (COMPRESSOR/NEBULIZER) MISC Nebulizer + mask/hoses per patient preference and insurance coverage, use as directed 10/18/23   Sunnie Nielsen, DO  oxyCODONE (OXY IR/ROXICODONE)  5 MG immediate release tablet Take 1 tablet (5 mg total) by mouth every 8 (eight) hours as needed for severe pain. 09/08/23   Tresa Moore, MD  pantoprazole (PROTONIX) 40 MG tablet Take 1 tablet (40 mg total) by mouth 2 (two) times daily. 10/18/23   Sunnie Nielsen, DO  polyethylene glycol powder (GLYCOLAX/MIRALAX) 17 GM/SCOOP powder Take 17 g by mouth daily as needed (if less than 3 BM"s in past 24 hours). 08/25/23   Esaw Grandchild A, DO  potassium chloride SA (KLOR-CON M) 20 MEQ tablet Take 1 tablet (20 mEq total) by mouth daily. 10/18/23   Sunnie Nielsen, DO  QUEtiapine (SEROQUEL) 50 MG tablet Take 1 tablet (50 mg total) by mouth at bedtime. 10/18/23 11/17/23  Sunnie Nielsen, DO  Respiratory Therapy Supplies (NEBULIZER/TUBING/MOUTHPIECE) KIT As directed 10/18/23   Sunnie Nielsen, DO  rosuvastatin (CRESTOR) 40 MG tablet TAKE 1 TABLET BY MOUTH EVERY DAY 10/20/23   Medina-Vargas, Monina C, NP     Allergies  Penicillins   Family History   Family  History  Problem Relation Age of Onset   Coronary artery disease Mother        alive   Other Other        2 sisters alive and well   Alcohol abuse Father        died @ 39   Other Paternal Grandmother        PACER     Physical Exam  Triage Vital Signs: ED Triage Vitals [11/14/23 0448]  Encounter Vitals Group     BP      Systolic BP Percentile      Diastolic BP Percentile      Pulse      Resp      Temp      Temp src      SpO2      Weight 198 lb (89.8 kg)     Height 5\' 5"  (1.651 m)     Head Circumference      Peak Flow      Pain Score 10     Pain Loc      Pain Education      Exclude from Growth Chart     Updated Vital Signs: BP 124/76   Pulse 96   Temp 98 F (36.7 C) (Oral)   Resp 19   Ht 5\' 5"  (1.651 m)   Wt 89.8 kg   SpO2 96%   BMI 32.95 kg/m    General: Awake, mild distress.  CV:  RRR.  Good peripheral perfusion.  Resp:  Normal effort.  CTAB. Abd:  Nontender.  No distention.  Other:  Left hand: Central palm with area of clustered erythematous vesicles with thin line of streaking towards wrist.  2+ radial pulse.  Brisk, less than 5-second cap refill.   ED Results / Procedures / Treatments  Labs (all labs ordered are listed, but only abnormal results are displayed) Labs Reviewed  COMPREHENSIVE METABOLIC PANEL - Abnormal; Notable for the following components:      Result Value   Potassium 3.2 (*)    Glucose, Bld 125 (*)    Calcium 8.6 (*)    Albumin 3.3 (*)    AST 67 (*)    Alkaline Phosphatase 197 (*)    Total Bilirubin 3.6 (*)    All other components within normal limits  ETHANOL - Abnormal; Notable for the following components:   Alcohol, Ethyl (B) 50 (*)    All other components within  normal limits  CBC WITH DIFFERENTIAL/PLATELET - Abnormal; Notable for the following components:   WBC 3.0 (*)    RBC 3.27 (*)    Hemoglobin 9.7 (*)    HCT 29.6 (*)    RDW 19.4 (*)    Platelets 90 (*)    Lymphs Abs 0.4 (*)    All other components within  normal limits  TROPONIN I (HIGH SENSITIVITY) - Abnormal; Notable for the following components:   Troponin I (High Sensitivity) 18 (*)    All other components within normal limits  RESP PANEL BY RT-PCR (RSV, FLU A&B, COVID)  [id number]  CULTURE, BLOOD (ROUTINE X 2)  CULTURE, BLOOD (ROUTINE X 2)  LIPASE, BLOOD  CBC WITH DIFFERENTIAL/PLATELET  URINALYSIS, W/ REFLEX TO CULTURE (INFECTION SUSPECTED)  URINE DRUG SCREEN, QUALITATIVE ([facility] ONLY)  TROPONIN I (HIGH SENSITIVITY)     EKG  ED ECG REPORT I, Lu Paradise J, the attending physician, personally viewed and interpreted this ECG.   Date: 11/14/2023  EKG Time: 0049  Rate: 103  Rhythm: Sinus tachycardia  Axis: Normal  Intervals: QTc 455  ST&T Change: Nonspecific    RADIOLOGY I have independently visualized and interpreted patient's imaging study as well as noted the radiology interpretation:  Chest x-ray: No acute cardiopulmonary process  Official radiology report(s): DG Chest Port 1 View  Result Date: 11/14/2023 CLINICAL DATA:  Chest pain EXAM: PORTABLE CHEST 1 VIEW COMPARISON:  10/27/2023 FINDINGS: Generous heart size accentuated by technique. Normal aortic and hilar contours. There is no edema, consolidation, effusion, or pneumothorax. Artifact from EKG leads. IMPRESSION: No active disease. Electronically Signed   By: Tiburcio Pea M.D.   On: 11/14/2023 05:43     PROCEDURES:  Critical Care performed: No  .1-3 Lead EKG Interpretation  Performed by: Irean Hong, MD Authorized by: Irean Hong, MD     Interpretation: abnormal     ECG rate:  103   ECG rate assessment: tachycardic     Rhythm: sinus tachycardia     Ectopy: none     Conduction: normal   Comments:     Patient placed on cardiac monitor to evaluate for arrhythmias    MEDICATIONS ORDERED IN ED: Medications  potassium chloride SA (KLOR-CON M) CR tablet 40 mEq (has no administration in time range)  doxycycline (VIBRAMYCIN) 100 mg in dextrose 5 % 250 mL  IVPB (has no administration in time range)  oxyCODONE-acetaminophen (PERCOCET/ROXICET) 5-325 MG per tablet 1 tablet (has no administration in time range)  morphine (PF) 4 MG/ML injection 4 mg (4 mg Intravenous Given 11/14/23 0505)     IMPRESSION / MDM / ASSESSMENT AND PLAN / ED COURSE  I reviewed the triage vital signs and the nursing notes.                             52 year old male presenting with chest pain, left hand pain with streaking and low-grade temperature. Differential diagnosis includes, but is not limited to, ACS, aortic dissection, pulmonary embolism, cardiac tamponade, pneumothorax, pneumonia, pericarditis, myocarditis, GI-related causes including esophagitis/gastritis, and musculoskeletal chest wall pain, cellulitis, abscess, etc.   I personally reviewed patient's records and note recent hospitalization 10/31 - 10/18/2023 for COPD exacerbation.  Patient's presentation is most consistent with acute presentation with potential threat to life or bodily function.  The patient is on the cardiac monitor to evaluate for evidence of arrhythmia and/or significant heart rate changes.  Will obtain cardiac panel, sepsis order set.  Administer morphine for pain.  Will reassess.  Clinical Course as of 11/14/23 0657  Fri Nov 14, 2023  2595 Patient complaining of left hand pain, not chest pain.  Will order Percocet.  Replete potassium.  Awaiting lab results including repeat troponin.  Care will be transferred to Dr. Scotty Court at change of shift pending lab results and disposition. [JS]    Clinical Course User Index [JS] Irean Hong, MD     FINAL CLINICAL IMPRESSION(S) / ED DIAGNOSES   Final diagnoses:  Chest pain, unspecified type  Cellulitis of left hand  Hypokalemia     Rx / DC Orders   ED Discharge Orders     None        Note:  This document was prepared using Dragon voice recognition software and may include unintentional dictation errors.   Irean Hong,  MD 11/14/23 [phone]

## 2023-11-14 NOTE — ED Triage Notes (Signed)
Pt here with cp and vomiting. Pt was just discharged from the ED but states the cp has been ongoing for a few days. Pt states pain is central and radiates to his back and shoulder. Pt states pain is tightness and pressure. Pt vomiting in ED lobby.

## 2023-11-19 LAB — CULTURE, BLOOD (ROUTINE X 2)
Culture: NO GROWTH
Culture: NO GROWTH
Special Requests: ADEQUATE
Special Requests: ADEQUATE

## 2023-11-22 ENCOUNTER — Emergency Department: Payer: Medicare HMO

## 2023-11-22 ENCOUNTER — Encounter: Payer: Self-pay | Admitting: Radiology

## 2023-11-22 DIAGNOSIS — I255 Ischemic cardiomyopathy: Secondary | ICD-10-CM | POA: Diagnosis not present

## 2023-11-22 DIAGNOSIS — R7989 Other specified abnormal findings of blood chemistry: Secondary | ICD-10-CM | POA: Insufficient documentation

## 2023-11-22 DIAGNOSIS — Z79899 Other long term (current) drug therapy: Secondary | ICD-10-CM | POA: Diagnosis not present

## 2023-11-22 DIAGNOSIS — I5032 Chronic diastolic (congestive) heart failure: Secondary | ICD-10-CM | POA: Diagnosis not present

## 2023-11-22 DIAGNOSIS — R0789 Other chest pain: Secondary | ICD-10-CM | POA: Diagnosis not present

## 2023-11-22 DIAGNOSIS — E876 Hypokalemia: Secondary | ICD-10-CM | POA: Diagnosis not present

## 2023-11-22 DIAGNOSIS — I21A1 Myocardial infarction type 2: Secondary | ICD-10-CM | POA: Diagnosis not present

## 2023-11-22 DIAGNOSIS — R0689 Other abnormalities of breathing: Secondary | ICD-10-CM | POA: Diagnosis not present

## 2023-11-22 DIAGNOSIS — Z955 Presence of coronary angioplasty implant and graft: Secondary | ICD-10-CM | POA: Insufficient documentation

## 2023-11-22 DIAGNOSIS — I110 Hypertensive heart disease with heart failure: Secondary | ICD-10-CM | POA: Diagnosis not present

## 2023-11-22 DIAGNOSIS — I214 Non-ST elevation (NSTEMI) myocardial infarction: Secondary | ICD-10-CM | POA: Insufficient documentation

## 2023-11-22 DIAGNOSIS — I25119 Atherosclerotic heart disease of native coronary artery with unspecified angina pectoris: Secondary | ICD-10-CM | POA: Insufficient documentation

## 2023-11-22 DIAGNOSIS — R778 Other specified abnormalities of plasma proteins: Secondary | ICD-10-CM | POA: Diagnosis not present

## 2023-11-22 DIAGNOSIS — F17210 Nicotine dependence, cigarettes, uncomplicated: Secondary | ICD-10-CM | POA: Insufficient documentation

## 2023-11-22 DIAGNOSIS — R072 Precordial pain: Secondary | ICD-10-CM | POA: Diagnosis not present

## 2023-11-22 DIAGNOSIS — I517 Cardiomegaly: Secondary | ICD-10-CM | POA: Diagnosis not present

## 2023-11-22 DIAGNOSIS — R000 Tachycardia, unspecified: Secondary | ICD-10-CM | POA: Diagnosis not present

## 2023-11-22 DIAGNOSIS — J449 Chronic obstructive pulmonary disease, unspecified: Secondary | ICD-10-CM | POA: Insufficient documentation

## 2023-11-22 DIAGNOSIS — I10 Essential (primary) hypertension: Secondary | ICD-10-CM | POA: Diagnosis not present

## 2023-11-22 DIAGNOSIS — R079 Chest pain, unspecified: Secondary | ICD-10-CM | POA: Diagnosis not present

## 2023-11-22 LAB — CBC
HCT: 31.9 % — ABNORMAL LOW (ref 39.0–52.0)
Hemoglobin: 10.5 g/dL — ABNORMAL LOW (ref 13.0–17.0)
MCH: 30.1 pg (ref 26.0–34.0)
MCHC: 32.9 g/dL (ref 30.0–36.0)
MCV: 91.4 fL (ref 80.0–100.0)
Platelets: 174 10*3/uL (ref 150–400)
RBC: 3.49 MIL/uL — ABNORMAL LOW (ref 4.22–5.81)
RDW: 18.1 % — ABNORMAL HIGH (ref 11.5–15.5)
WBC: 7 10*3/uL (ref 4.0–10.5)
nRBC: 0 % (ref 0.0–0.2)

## 2023-11-22 LAB — TROPONIN I (HIGH SENSITIVITY): Troponin I (High Sensitivity): 14 ng/L (ref <18)

## 2023-11-22 LAB — BASIC METABOLIC PANEL
Anion gap: 13 (ref 5–15)
BUN: <5 mg/dL — ABNORMAL LOW (ref 6–20)
CO2: 19 mmol/L — ABNORMAL LOW (ref 22–32)
Calcium: 8.9 mg/dL (ref 8.9–10.3)
Chloride: 106 mmol/L (ref 98–111)
Creatinine, Ser: 0.69 mg/dL (ref 0.61–1.24)
GFR, Estimated: >60 mL/min (ref >60)
Glucose, Bld: 124 mg/dL — ABNORMAL HIGH (ref 70–99)
Potassium: 2.6 mmol/L — CL (ref 3.5–5.1)
Sodium: 138 mmol/L (ref 135–145)

## 2023-11-22 NOTE — ED Triage Notes (Signed)
BS 187 161/90 98% RA 106

## 2023-11-22 NOTE — ED Triage Notes (Signed)
Pt is intoxicated and states that chest pain woke him from sleep.

## 2023-11-23 ENCOUNTER — Encounter: Payer: Self-pay | Admitting: Internal Medicine

## 2023-11-23 ENCOUNTER — Other Ambulatory Visit: Payer: Self-pay

## 2023-11-23 ENCOUNTER — Observation Stay
Admission: EM | Admit: 2023-11-23 | Discharge: 2023-11-24 | Disposition: A | Discharge status: Home / Self Care | Payer: Medicare HMO | Attending: Obstetrics and Gynecology | Admitting: Obstetrics and Gynecology

## 2023-11-23 DIAGNOSIS — K7460 Unspecified cirrhosis of liver: Secondary | ICD-10-CM | POA: Diagnosis not present

## 2023-11-23 DIAGNOSIS — R072 Precordial pain: Secondary | ICD-10-CM | POA: Diagnosis not present

## 2023-11-23 DIAGNOSIS — I25118 Atherosclerotic heart disease of native coronary artery with other forms of angina pectoris: Secondary | ICD-10-CM | POA: Diagnosis not present

## 2023-11-23 DIAGNOSIS — I5032 Chronic diastolic (congestive) heart failure: Secondary | ICD-10-CM | POA: Diagnosis present

## 2023-11-23 DIAGNOSIS — E271 Primary adrenocortical insufficiency: Secondary | ICD-10-CM | POA: Diagnosis present

## 2023-11-23 DIAGNOSIS — I5030 Unspecified diastolic (congestive) heart failure: Secondary | ICD-10-CM

## 2023-11-23 DIAGNOSIS — Z720 Tobacco use: Secondary | ICD-10-CM | POA: Diagnosis present

## 2023-11-23 DIAGNOSIS — I25119 Atherosclerotic heart disease of native coronary artery with unspecified angina pectoris: Secondary | ICD-10-CM | POA: Diagnosis present

## 2023-11-23 DIAGNOSIS — F1410 Cocaine abuse, uncomplicated: Secondary | ICD-10-CM

## 2023-11-23 DIAGNOSIS — I214 Non-ST elevation (NSTEMI) myocardial infarction: Secondary | ICD-10-CM

## 2023-11-23 DIAGNOSIS — J449 Chronic obstructive pulmonary disease, unspecified: Secondary | ICD-10-CM | POA: Insufficient documentation

## 2023-11-23 DIAGNOSIS — I25112 Atherosclerotic heart disease of native coronary artery with refractory angina pectoris: Secondary | ICD-10-CM

## 2023-11-23 DIAGNOSIS — E876 Hypokalemia: Secondary | ICD-10-CM | POA: Diagnosis present

## 2023-11-23 DIAGNOSIS — F14988 Cocaine use, unspecified with other cocaine-induced disorder: Secondary | ICD-10-CM

## 2023-11-23 DIAGNOSIS — I2489 Other forms of acute ischemic heart disease: Secondary | ICD-10-CM | POA: Diagnosis present

## 2023-11-23 DIAGNOSIS — R079 Chest pain, unspecified: Principal | ICD-10-CM

## 2023-11-23 DIAGNOSIS — I999 Unspecified disorder of circulatory system: Secondary | ICD-10-CM

## 2023-11-23 DIAGNOSIS — F1429 Cocaine dependence with unspecified cocaine-induced disorder: Secondary | ICD-10-CM | POA: Diagnosis present

## 2023-11-23 DIAGNOSIS — D61818 Other pancytopenia: Secondary | ICD-10-CM | POA: Diagnosis present

## 2023-11-23 DIAGNOSIS — K219 Gastro-esophageal reflux disease without esophagitis: Secondary | ICD-10-CM | POA: Diagnosis not present

## 2023-11-23 DIAGNOSIS — I2510 Atherosclerotic heart disease of native coronary artery without angina pectoris: Secondary | ICD-10-CM

## 2023-11-23 DIAGNOSIS — I255 Ischemic cardiomyopathy: Secondary | ICD-10-CM | POA: Diagnosis present

## 2023-11-23 DIAGNOSIS — R7989 Other specified abnormal findings of blood chemistry: Secondary | ICD-10-CM | POA: Diagnosis present

## 2023-11-23 HISTORY — DX: Atherosclerotic heart disease of native coronary artery without angina pectoris: I25.10

## 2023-11-23 HISTORY — DX: Obesity, class 1: E66.811

## 2023-11-23 HISTORY — DX: Alcoholic cirrhosis of liver without ascites: K70.30

## 2023-11-23 HISTORY — DX: Essential (primary) hypertension: I10

## 2023-11-23 HISTORY — DX: Portal hypertension: K76.6

## 2023-11-23 HISTORY — DX: Unspecified diastolic (congestive) heart failure: I50.30

## 2023-11-23 HISTORY — DX: Chronic obstructive pulmonary disease, unspecified: J44.9

## 2023-11-23 HISTORY — DX: Patient's noncompliance with other medical treatment and regimen due to unspecified reason: Z91.199

## 2023-11-23 HISTORY — DX: Opioid use, unspecified, uncomplicated: F11.90

## 2023-11-23 HISTORY — DX: Primary adrenocortical insufficiency: E27.1

## 2023-11-23 LAB — URINE DRUG SCREEN, QUALITATIVE (ARMC ONLY)
Amphetamines, Ur Screen: NOT DETECTED
Barbiturates, Ur Screen: NOT DETECTED
Benzodiazepine, Ur Scrn: NOT DETECTED
Cannabinoid 50 Ng, Ur ~~LOC~~: NOT DETECTED
Cocaine Metabolite,Ur ~~LOC~~: POSITIVE — AB
MDMA (Ecstasy)Ur Screen: NOT DETECTED
Methadone Scn, Ur: NOT DETECTED
Opiate, Ur Screen: NOT DETECTED
Phencyclidine (PCP) Ur S: NOT DETECTED
Tricyclic, Ur Screen: NOT DETECTED

## 2023-11-23 LAB — HEPATIC FUNCTION PANEL
ALT: 26 U/L (ref 0–44)
AST: 85 U/L — ABNORMAL HIGH (ref 15–41)
Albumin: 2.8 g/dL — ABNORMAL LOW (ref 3.5–5.0)
Alkaline Phosphatase: 188 U/L — ABNORMAL HIGH (ref 38–126)
Bilirubin, Direct: 1.2 mg/dL — ABNORMAL HIGH (ref 0.0–0.2)
Indirect Bilirubin: 2 mg/dL — ABNORMAL HIGH (ref 0.3–0.9)
Total Bilirubin: 3.2 mg/dL — ABNORMAL HIGH (ref <1.2)
Total Protein: 6.6 g/dL (ref 6.5–8.1)

## 2023-11-23 LAB — TROPONIN I (HIGH SENSITIVITY)
Troponin I (High Sensitivity): 33 ng/L — ABNORMAL HIGH (ref <18)
Troponin I (High Sensitivity): 48 ng/L — ABNORMAL HIGH (ref <18)
Troponin I (High Sensitivity): 32 ng/L — ABNORMAL HIGH (ref <18)

## 2023-11-23 LAB — GLUCOSE, CAPILLARY: Glucose-Capillary: 109 mg/dL — ABNORMAL HIGH (ref 70–99)

## 2023-11-23 LAB — MAGNESIUM: Magnesium: 1.9 mg/dL (ref 1.7–2.4)

## 2023-11-23 LAB — ETHANOL: Alcohol, Ethyl (B): 162 mg/dL — ABNORMAL HIGH (ref <10)

## 2023-11-23 MED ORDER — ACETAMINOPHEN 500 MG PO TABS
500.0000 mg | ORAL_TABLET | Freq: Once | ORAL | Status: AC
  Administered 2023-11-23: 500 mg via ORAL
  Filled 2023-11-23: qty 1

## 2023-11-23 MED ORDER — MIDODRINE HCL 5 MG PO TABS
5.0000 mg | ORAL_TABLET | Freq: Three times a day (TID) | ORAL | Status: DC
Start: 2023-11-23 — End: 2023-11-24
  Administered 2023-11-24 (×3): 5 mg via ORAL
  Filled 2023-11-23 (×3): qty 1

## 2023-11-23 MED ORDER — LORAZEPAM 2 MG/ML IJ SOLN
1.0000 mg | INTRAMUSCULAR | Status: DC | PRN
  Administered 2023-11-23: 2 mg via INTRAVENOUS
  Filled 2023-11-23: qty 1

## 2023-11-23 MED ORDER — FOLIC ACID 1 MG PO TABS
1.0000 mg | ORAL_TABLET | Freq: Every day | ORAL | Status: DC
  Administered 2023-11-23 – 2023-11-24 (×2): 1 mg via ORAL
  Filled 2023-11-23 (×2): qty 1

## 2023-11-23 MED ORDER — POTASSIUM CHLORIDE CRYS ER 20 MEQ PO TBCR
20.0000 meq | EXTENDED_RELEASE_TABLET | Freq: Every day | ORAL | Status: DC
Start: 2023-11-23 — End: 2023-11-23

## 2023-11-23 MED ORDER — ACETAMINOPHEN 325 MG PO TABS
650.0000 mg | ORAL_TABLET | ORAL | Status: DC | PRN
Start: 2023-11-23 — End: 2023-11-24
  Administered 2023-11-23 – 2023-11-24 (×3): 650 mg via ORAL
  Filled 2023-11-23 (×3): qty 2

## 2023-11-23 MED ORDER — SODIUM CHLORIDE 0.9 % IV BOLUS
500.0000 mL | Freq: Once | INTRAVENOUS | Status: AC
  Administered 2023-11-23: 500 mL via INTRAVENOUS

## 2023-11-23 MED ORDER — ASPIRIN 81 MG PO CHEW
324.0000 mg | CHEWABLE_TABLET | Freq: Once | ORAL | Status: AC
  Administered 2023-11-23: 324 mg via ORAL
  Filled 2023-11-23: qty 4

## 2023-11-23 MED ORDER — THIAMINE MONONITRATE 100 MG PO TABS
100.0000 mg | ORAL_TABLET | Freq: Every day | ORAL | Status: DC
  Administered 2023-11-23 – 2023-11-24 (×2): 100 mg via ORAL
  Filled 2023-11-23 (×2): qty 1

## 2023-11-23 MED ORDER — POTASSIUM CHLORIDE CRYS ER 20 MEQ PO TBCR
40.0000 meq | EXTENDED_RELEASE_TABLET | Freq: Once | ORAL | Status: AC
  Administered 2023-11-23: 40 meq via ORAL
  Filled 2023-11-23: qty 2

## 2023-11-23 MED ORDER — THIAMINE HCL 100 MG/ML IJ SOLN: 100.0000 mg | Freq: Every day | INTRAMUSCULAR | Status: DC

## 2023-11-23 MED ORDER — ONDANSETRON HCL 4 MG/2ML IJ SOLN: 4.0000 mg | Freq: Four times a day (QID) | INTRAMUSCULAR | Status: DC | PRN

## 2023-11-23 MED ORDER — MORPHINE SULFATE (PF) 2 MG/ML IV SOLN
2.0000 mg | INTRAVENOUS | Status: DC | PRN
  Administered 2023-11-23 – 2023-11-24 (×5): 2 mg via INTRAVENOUS
  Filled 2023-11-23 (×5): qty 1

## 2023-11-23 MED ORDER — QUETIAPINE FUMARATE 25 MG PO TABS: 50.0000 mg | ORAL_TABLET | Freq: Every day | ORAL | Status: DC

## 2023-11-23 MED ORDER — ATORVASTATIN CALCIUM 20 MG PO TABS: 40.0000 mg | ORAL_TABLET | Freq: Every day | ORAL | Status: DC

## 2023-11-23 MED ORDER — FLUTICASONE FUROATE-VILANTEROL 100-25 MCG/ACT IN AEPB: 1.0000 | INHALATION_SPRAY | Freq: Every day | RESPIRATORY_TRACT | Status: DC

## 2023-11-23 MED ORDER — ALBUTEROL SULFATE (2.5 MG/3ML) 0.083% IN NEBU: 3.0000 mL | INHALATION_SOLUTION | Freq: Four times a day (QID) | RESPIRATORY_TRACT | Status: DC | PRN

## 2023-11-23 MED ORDER — ENOXAPARIN SODIUM 100 MG/ML IJ SOSY
90.0000 mg | PREFILLED_SYRINGE | Freq: Once | INTRAMUSCULAR | Status: AC
Start: 2023-11-23 — End: 2023-11-23
  Administered 2023-11-23: 90 mg via SUBCUTANEOUS
  Filled 2023-11-23: qty 1

## 2023-11-23 MED ORDER — ADULT MULTIVITAMIN W/MINERALS CH
1.0000 | ORAL_TABLET | Freq: Every day | ORAL | Status: DC
  Administered 2023-11-23 – 2023-11-24 (×2): 1 via ORAL
  Filled 2023-11-23 (×2): qty 1

## 2023-11-23 MED ORDER — LORAZEPAM 1 MG PO TABS: 1.0000 mg | ORAL_TABLET | ORAL | Status: DC | PRN

## 2023-11-23 MED ORDER — PANTOPRAZOLE SODIUM 40 MG PO TBEC
40.0000 mg | DELAYED_RELEASE_TABLET | Freq: Two times a day (BID) | ORAL | Status: DC
Start: 2023-11-23 — End: 2023-11-24
  Administered 2023-11-23 – 2023-11-24 (×3): 40 mg via ORAL
  Filled 2023-11-23 (×3): qty 1

## 2023-11-23 MED ORDER — FUROSEMIDE 40 MG PO TABS: 40.0000 mg | ORAL_TABLET | Freq: Every day | ORAL | Status: DC

## 2023-11-23 MED ORDER — HYDROCORTISONE 5 MG PO TABS
5.0000 mg | ORAL_TABLET | Freq: Two times a day (BID) | ORAL | Status: DC
Start: 2023-11-23 — End: 2023-11-24
  Administered 2023-11-23 – 2023-11-24 (×2): 5 mg via ORAL
  Filled 2023-11-23 (×5): qty 1

## 2023-11-23 MED ORDER — ROSUVASTATIN CALCIUM 20 MG PO TABS
40.0000 mg | ORAL_TABLET | Freq: Every day | ORAL | Status: DC
Start: 2023-11-23 — End: 2023-11-23

## 2023-11-23 MED ORDER — ASPIRIN 81 MG PO TBEC
81.0000 mg | DELAYED_RELEASE_TABLET | Freq: Every day | ORAL | Status: DC
  Administered 2023-11-23 – 2023-11-24 (×2): 81 mg via ORAL
  Filled 2023-11-23 (×2): qty 1

## 2023-11-23 MED ORDER — ENOXAPARIN SODIUM 40 MG/0.4ML IJ SOSY
40.0000 mg | PREFILLED_SYRINGE | INTRAMUSCULAR | Status: DC
  Administered 2023-11-23 – 2023-11-24 (×2): 40 mg via SUBCUTANEOUS
  Filled 2023-11-23 (×2): qty 0.4

## 2023-11-23 NOTE — Assessment & Plan Note (Signed)
Cocaine positive today with associated chest pain Concurrent alcohol use Withdrawal protocol Monitor

## 2023-11-23 NOTE — TOC Initial Note (Signed)
Transition of Care [facility]) - Initial/Assessment Note    Patient Details  Name: Ethan Wells MRN: [id number] Date of Birth: 10-24-1971  Transition of Care [employer]) CM/SW Contact:    Colette Ribas, LCSWA Phone Number: 11/23/2023, 2:55 PM  Clinical Narrative:                      CSW received a consult for SA resources. SA resources added to AVS.   Patient Goals and CMS Choice            Expected Discharge Plan and Services                                              Prior Living Arrangements/Services                       Activities of Daily Living      Permission Sought/Granted                  Emotional Assessment              Admission diagnosis:  Precordial chest pain [R07.2] Patient Active Problem List   Diagnosis Date Noted   Precordial chest pain 11/23/2023   COPD (chronic obstructive pulmonary disease) (HCC) 11/23/2023   COPD exacerbation (HCC) 10/16/2023   Hepatic cirrhosis (HCC) 09/03/2023   Acute metabolic encephalopathy 08/11/2023   Acute blood loss anemia (ABLA) 08/08/2023   Elevated liver function tests 07/02/2023   Pancytopenia (HCC) 07/01/2023   Metabolic acidosis 07/01/2023   Liver mass 06/30/2023   Nausea vomiting and diarrhea 06/30/2023   Migraine 06/30/2023   Cholecystitis, acute 05/10/2023   UTI (urinary tract infection) 05/05/2023   Chronic diastolic CHF (congestive heart failure) (HCC) 05/05/2023   Alcohol abuse 05/05/2023   Alcoholic cirrhosis (HCC) 04/12/2023   Esophageal varices with bleeding in diseases classified elsewhere (HCC) 04/09/2023   Hepatic encephalopathy (HCC) 04/09/2023   Secondary esophageal varices with bleeding (HCC) 04/08/2023   Upper GI bleeding 04/07/2023   Leg edema 03/01/2023   GI bleeding 02/22/2023   HLD (hyperlipidemia) 02/22/2023   Adrenal insufficiency (HCC) 02/22/2023   Alcoholic liver disease (HCC) 02/22/2023   Upper GI bleed 02/22/2023   Myocardial injury  01/29/2023   Adrenal insufficiency (Addison's disease) (HCC) 01/28/2023   Reactive thrombocytosis 01/24/2023   Hypophosphatemia 01/22/2023   Acute hypoxic respiratory failure (HCC) 01/20/2023   Orthostatic hypotension 01/17/2023   Anasarca 01/17/2023   Hemorrhagic shock (HCC) 01/17/2023   Obesity (BMI 30-39.9) 01/17/2023   Cocaine dependence with cocaine-induced disorder (HCC) 01/17/2023   Rectal bleeding 01/17/2023   Peripheral edema 01/17/2023   History of benzodiazepine use 01/17/2023   Chest pain 11/19/2022   Acute on chronic heart failure with preserved ejection fraction (HFpEF) (HCC) 10/29/2022   Acute respiratory distress 10/29/2022   Elevated rheumatoid factor 10/28/2022   Hyperkalemia 10/25/2022   Hemorrhoids, internal, with bleeding 10/25/2022   Mesenteric artery stenosis (HCC) 10/23/2022   Iron deficiency anemia 10/23/2022   Hematochezia    Abnormal CT scan, colon    Colitis 09/30/2022   Prolonged QT interval 09/30/2022   Alcohol use disorder 08/19/2022   GI bleed 05/28/2022   Non-intractable vomiting    Epigastric pain    Polyp of ascending colon    Anemia due to GI blood loss 08/07/2021   Acute gastritis  Hyperbilirubinemia    Elevated troponin    Notalgia    History of ST elevation myocardial infarction (STEMI) 12/19/2020   Alcohol dependence (HCC) 12/19/2020   Depression with anxiety 10/05/2020   Esophageal reflux 10/05/2020   Headache 10/05/2020   Hyperlipidemia 10/05/2020   Hyponatremia 06/04/2020   Hypomagnesemia 06/04/2020   Chronic systolic CHF (congestive heart failure) (HCC) 06/04/2020   Leukocytosis 06/04/2020   Abdominal pain 06/04/2020   Hypokalemia 06/04/2020   Ischemic cardiomyopathy    Polysubstance abuse (HCC)    NSTEMI (non-ST elevated myocardial infarction) (HCC) 07/12/2019   Abdominal pain, RUQ 05/25/2018   Chest pain, mid sternal 11/17/2012   Cocaine abuse (HCC)    Marijuana abuse    Tobacco abuse 02/26/2011   CAD (coronary  artery disease) 02/26/2011   Other specified forms of chronic ischemic heart disease 02/26/2011   PCP:  [name], NP Pharmacy:   [facility] [address]  - [address] [place] [address] Phone: [phone] Fax: [phone]  [facility] [address] [place] [address] Phone: [phone] Fax: [phone]  [id number] [place], [place] - [address] [place] [address] Phone: [phone] Fax: [phone]  [facility] [address] [place] [address] Phone: [phone] Fax: [phone]     Social Determinants of Health (SDOH) Social History: SDOH Screenings   Food Insecurity: No Food Insecurity (10/16/2023)  Housing: Patient Unable To Answer (10/16/2023)  Transportation Needs: No Transportation Needs (10/16/2023)  Recent Concern: Transportation Needs - Unmet Transportation Needs (09/03/2023)  Utilities: Not At Risk (10/16/2023)  Alcohol Screen: Medium Risk (10/01/2022)  Tobacco Use: High Risk (11/22/2023)   SDOH Interventions:     Readmission Risk Interventions    10/16/2023   12:56 PM 08/11/2023   10:35 AM 02/24/2023    3:12 PM  Readmission Risk Prevention Plan  Transportation Screening Complete Complete Complete  Medication Review (RN Care Manager) Complete Complete Complete  PCP or Specialist appointment within 3-5 days of discharge Complete Complete   HRI or Home Care Consult Patient refused    SW Recovery Care/Counseling Consult Patient refused Complete   Palliative Care Screening Patient Refused Not Applicable Not Applicable  Skilled Nursing Facility Patient Refused Not Applicable Not Applicable

## 2023-11-23 NOTE — Assessment & Plan Note (Signed)
Cell lines fairly stable at present Monitor

## 2023-11-23 NOTE — Assessment & Plan Note (Signed)
Continue home midodrine and Solu-Cortef

## 2023-11-23 NOTE — Consult Note (Signed)
Cardiology Consultation   Patient ID: RAFID WATT MRN: [id number]; DOB: October 30, 1971  Admit date: 11/23/2023 Date of Consult: 11/23/2023  PCP:  [name], NP   [facility] Providers Cardiologist:  Verne Carrow, MD        [name] is a 52 y.o. male with a hx of CAD with prior STEMI, ischemic cardiomyopathy, cirrhosis s/p TIPS, polysubstance abuse, recurrent GI bleeding who is being seen 11/23/2023 for the evaluation of NSTEMI in the setting of cocaine use at the request of Dr. Alvester Morin.  History of Present Illness:   [name] notes that he used cocaine 1 day prior to presentation. Also has routine tobacco and alcohol use. Presented for further evaluation when he developed severe chest pain. This is the same pain he has felt in the past.   Evaluation notable for hs Tn 33, 48, 32. Ethanol level this AM still elevated at 162 (presented yesterday at 10 PM, this is approximately 12 hours after presentation). Drug screen positive for cocaine, otherwise negative. ECG NSR with diffuse nonspecific ST-T abnormalities.  He describes the pain as like lightning in the center of his chest. Notes that it began even before he used cocaine. Nothing made it worse, and morphine makes it better. Not associated with shortness of breath, nausea, or diaphoresis. Was asleep on my arrival but notes he is still having the pain at this time. Not improved with nitroglycerin.   Past Medical History:  Diagnosis Date   CAD (coronary artery disease)    a. 01/2011 Anterior STEMI/Cath/PCI: LM nl, LAD 100d (3.5x37mm Vision BMS placed), LCX 45m, RI 50, RCA min irregs, EF 40% w/ apical, inferoapical HK; b. 10/2022 NSTSEMI/Cath: LM nl, LAD 5p/m, RI 85, LCX 167m, OM1 mild dzs, OM3 100 fills via L->L collats from dLAD, RCA 80p, 136m, RPDA fills via collats from LAD. EF 45-50%-->Med Rx.   Cardiac arrest - ventricular fibrillation    a. In setting of STEMI  01/2011   Chronic pain    Cocaine abuse (HCC)    Depression with anxiety    ETOH abuse    a. 6-12 beers/day   GERD (gastroesophageal reflux disease)    GI bleed    Hemorrhoids    Hyperlipidemia    Hypertension    Ischemic cardiomyopathy    a. 06/2011 Echo: EF 45-50%, No rwma; b. 10/2022 Echo: EF 55-60%, no rwma, nl RV fxn.   Marijuana abuse    Migraines    Tobacco abuse    a. 1/2 ppd x 26 yrs    Past Surgical History:  Procedure Laterality Date   COLONOSCOPY  08/09/2021   Procedure: COLONOSCOPY;  Surgeon: Midge Minium, MD;  Location: [facility] ENDOSCOPY;  Service: Endoscopy;;   CORONARY ANGIOPLASTY WITH STENT PLACEMENT     ESOPHAGEAL BANDING  04/08/2023   Procedure: ESOPHAGEAL BANDING;  Surgeon: Tressia Danas, MD;  Location: [facility] ENDOSCOPY;  Service: Gastroenterology;;   ESOPHAGOGASTRODUODENOSCOPY N/A 05/30/2022   Procedure: ESOPHAGOGASTRODUODENOSCOPY (EGD);  Surgeon: Toney Reil, MD;  Location: [facility] ENDOSCOPY;  Service: Gastroenterology;  Laterality: N/A;   ESOPHAGOGASTRODUODENOSCOPY (EGD) WITH PROPOFOL N/A 03/01/2021   Procedure: ESOPHAGOGASTRODUODENOSCOPY (EGD) WITH PROPOFOL;  Surgeon: Toney Reil, MD;  Location: [facility];  Service: Endoscopy;  Laterality: N/A;   ESOPHAGOGASTRODUODENOSCOPY (EGD) WITH PROPOFOL N/A 08/09/2021   Procedure: ESOPHAGOGASTRODUODENOSCOPY (EGD) WITH PROPOFOL;  Surgeon: Midge Minium, MD;  Location: [facility] ENDOSCOPY;  Service: Endoscopy;  Laterality: N/A;   ESOPHAGOGASTRODUODENOSCOPY (EGD) WITH PROPOFOL N/A 03/01/2023  Procedure: ESOPHAGOGASTRODUODENOSCOPY (EGD) WITH PROPOFOL;  Surgeon: Jaynie Collins, DO;  Location: [facility] ENDOSCOPY;  Service: Gastroenterology;  Laterality: N/A;   ESOPHAGOGASTRODUODENOSCOPY (EGD) WITH PROPOFOL N/A 04/08/2023   Procedure: ESOPHAGOGASTRODUODENOSCOPY (EGD) WITH PROPOFOL;  Surgeon: Tressia Danas, MD;  Location: [facility] ENDOSCOPY;  Service: Gastroenterology;  Laterality: N/A;   ESOPHAGOGASTRODUODENOSCOPY  (EGD) WITH PROPOFOL N/A 08/09/2023   Procedure: ESOPHAGOGASTRODUODENOSCOPY (EGD) WITH PROPOFOL;  Surgeon: Regis Bill, MD;  Location: [facility] ENDOSCOPY;  Service: Endoscopy;  Laterality: N/A;   ESOPHAGOGASTRODUODENOSCOPY (EGD) WITH PROPOFOL N/A 08/19/2023   Procedure: ESOPHAGOGASTRODUODENOSCOPY (EGD) WITH PROPOFOL;  Surgeon: Regis Bill, MD;  Location: [facility] ENDOSCOPY;  Service: Endoscopy;  Laterality: N/A;   EVALUATION UNDER ANESTHESIA WITH HEMORRHOIDECTOMY N/A 10/31/2022   Procedure: EXAM UNDER ANESTHESIA WITH SUTURE LIGATION OF TWO BLEEDING PEDICLES;  Surgeon: Henrene Dodge, MD;  Location: [facility] ORS;  Service: General;  Laterality: N/A;   FLEXIBLE SIGMOIDOSCOPY N/A 10/02/2022   Procedure: FLEXIBLE SIGMOIDOSCOPY;  Surgeon: Meryl Dare, MD;  Location: [facility] ENDOSCOPY;  Service: Gastroenterology;  Laterality: N/A;   HEMOSTASIS CONTROL  08/09/2023   Procedure: HEMOSTASIS CONTROL;  Surgeon: Regis Bill, MD;  Location: [facility] ENDOSCOPY;  Service: Endoscopy;;   IR TIPS  04/11/2023   IR US GUIDE VASC ACCESS RIGHT  04/11/2023   IR US GUIDE VASC ACCESS RIGHT  04/11/2023   IR US GUIDE VASC ACCESS RIGHT  04/11/2023   LEFT HEART CATH AND CORONARY ANGIOGRAPHY N/A 11/04/2022   Procedure: LEFT HEART CATH AND CORONARY ANGIOGRAPHY;  Surgeon: Iran Ouch, MD;  Location: [facility] INVASIVE CV LAB;  Service: Cardiovascular;  Laterality: N/A;   RADIOLOGY WITH ANESTHESIA N/A 04/11/2023   Procedure: IR WITH ANESTHESIA;  Surgeon: Oley Balm, MD;  Location: [facility] OR;  Service: Radiology;  Laterality: N/A;   VISCERAL ANGIOGRAPHY N/A 10/24/2022   Procedure: VISCERAL ANGIOGRAPHY;  Surgeon: Annice Needy, MD;  Location: [facility] INVASIVE CV LAB;  Service: Cardiovascular;  Laterality: N/A;   VISCERAL ARTERY INTERVENTION N/A 05/09/2023   Procedure: VISCERAL ARTERY INTERVENTION;  Surgeon: Renford Dills, MD;  Location: [facility] INVASIVE CV LAB;  Service: Cardiovascular;  Laterality: N/A;     Home Medications:   Prior to Admission medications   Medication Sig Start Date End Date Taking? Authorizing Provider  albuterol (VENTOLIN HFA) 108 (90 Base) MCG/ACT inhaler Inhale 2 puffs into the lungs every 6 (six) hours as needed for wheezing or shortness of breath. 10/15/23  Yes Chesley Noon, MD  docusate sodium (COLACE) 100 MG capsule Take 1 capsule (100 mg total) by mouth 2 (two) times daily as needed (if less than 3 BM"s in past 24 hours). 08/25/23  Yes Esaw Grandchild A, DO  doxycycline (VIBRAMYCIN) 100 MG capsule Take 1 capsule (100 mg total) by mouth 2 (two) times daily for 10 days. 11/14/23 11/24/23 Yes Sharman Cheek, MD  ipratropium-albuterol (DUONEB) 0.5-2.5 (3) MG/3ML SOLN Take 3 mLs by nebulization every 2 (two) hours as needed (wheeze, SOB). 10/18/23  Yes Sunnie Nielsen, DO  pantoprazole (PROTONIX) 40 MG tablet Take 1 tablet (40 mg total) by mouth 2 (two) times daily. 10/18/23  Yes Sunnie Nielsen, DO  polyethylene glycol powder (GLYCOLAX/MIRALAX) 17 GM/SCOOP powder Take 17 g by mouth daily as needed (if less than 3 BM"s in past 24 hours). 08/25/23  Yes Esaw Grandchild A, DO  feeding supplement (ENSURE ENLIVE / ENSURE PLUS) LIQD Take 237 mLs by mouth 2 (two) times daily between meals. 08/25/23   Esaw Grandchild A, DO  fluticasone furoate-vilanterol (BREO ELLIPTA) 100-25 MCG/ACT AEPB  Inhale 1 puff into the lungs daily. Patient not taking: Reported on 11/23/2023 10/19/23   Sunnie Nielsen, DO  furosemide (LASIX) 40 MG tablet Take 1 tablet (40 mg total) by mouth daily. Increase to 1 tablet (40 mg total) by mouth TWICE daily (total daily dose 80 mg) as needed for up to 3 days for increased leg swelling, shortness of breath, weight gain 5+ lbs over 1-2 days. Seek medical care if these symptoms are not improving with increased dose. Patient not taking: Reported on 11/23/2023 10/18/23 12/17/23  Sunnie Nielsen, DO  gabapentin (NEURONTIN) 100 MG capsule Take 1 capsule (100 mg total) by mouth 2 (two) times  daily. 10/18/23 11/17/23  Sunnie Nielsen, DO  hydrocortisone (CORTEF) 5 MG tablet Take 1 tablet (5 mg total) by mouth 2 (two) times daily. Patient not taking: Reported on 11/23/2023 10/18/23   Sunnie Nielsen, DO  methocarbamol (ROBAXIN) 750 MG tablet Take 1 tablet (750 mg total) by mouth 2 (two) times daily as needed for muscle spasms. Patient not taking: Reported on 11/23/2023 10/18/23   Sunnie Nielsen, DO  midodrine (PROAMATINE) 5 MG tablet Take 1 tablet (5 mg total) by mouth 3 (three) times daily with meals. Patient not taking: Reported on 11/23/2023 10/18/23   Sunnie Nielsen, DO  Nebulizers (COMPRESSOR/NEBULIZER) MISC Nebulizer + mask/hoses per patient preference and insurance coverage, use as directed 10/18/23   Sunnie Nielsen, DO  oxyCODONE (OXY IR/ROXICODONE) 5 MG immediate release tablet Take 1 tablet (5 mg total) by mouth every 8 (eight) hours as needed for severe pain. Patient not taking: Reported on 11/23/2023 09/08/23   Lolita Patella B, MD  potassium chloride SA (KLOR-CON M) 20 MEQ tablet Take 1 tablet (20 mEq total) by mouth daily. Patient not taking: Reported on 11/23/2023 10/18/23   Sunnie Nielsen, DO  QUEtiapine (SEROQUEL) 50 MG tablet Take 1 tablet (50 mg total) by mouth at bedtime. Patient not taking: Reported on 11/23/2023 10/18/23 11/23/23  Sunnie Nielsen, DO  Respiratory Therapy Supplies (NEBULIZER/TUBING/MOUTHPIECE) KIT As directed 10/18/23   Sunnie Nielsen, DO  rosuvastatin (CRESTOR) 40 MG tablet TAKE 1 TABLET BY MOUTH EVERY DAY Patient not taking: Reported on 11/23/2023 10/20/23   Medina-Vargas, Margit Banda, NP    Inpatient Medications: Scheduled Meds:  aspirin EC  81 mg Oral Daily   enoxaparin (LOVENOX) injection  40 mg Subcutaneous Q24H   folic acid  1 mg Oral Daily   hydrocortisone  5 mg Oral BID   midodrine  5 mg Oral TID WC   multivitamin with minerals  1 tablet Oral Daily   pantoprazole  40 mg Oral BID   thiamine  100 mg Oral Daily   Or    thiamine  100 mg Intravenous Daily   Continuous Infusions:  PRN Meds: acetaminophen, albuterol, LORazepam **OR** LORazepam, morphine injection, ondansetron (ZOFRAN) IV  Allergies:    Allergies  Allergen Reactions   Penicillins Hives and Rash    Tolerated Ceftriaxone 10/2022    Social History:   Social History   Socioeconomic History   Marital status: Single    Spouse name: Not on file   Number of children: Not on file   Years of education: Not on file   Highest education level: Not on file  Occupational History   Not on file  Tobacco Use   Smoking status: Every Day    Current packs/day: 0.50    Types: Cigarettes   Smokeless tobacco: Never   Tobacco comments:    started age 51  Vaping Use  Vaping status: Never Used  Substance and Sexual Activity   Alcohol use: Yes    Alcohol/week: 12.0 standard drinks of alcohol    Types: 12 Cans of beer per week    Comment: pt intoxicated   Drug use: Yes    Types: Cocaine, Marijuana   Sexual activity: Not Currently  Other Topics Concern   Not on file  Social History Narrative   Single, lives in [place] with his mother.  He does not routinely exercise.  He sometimes works, helping to deliver appliances to homes.   Social Determinants of Health   Financial Resource Strain: Not on file  Food Insecurity: No Food Insecurity (10/16/2023)   Hunger Vital Sign    Worried About Running Out of Food in the Last Year: Never true    Ran Out of Food in the Last Year: Never true  Transportation Needs: No Transportation Needs (10/16/2023)   PRAPARE - Administrator, Civil Service (Medical): No    Lack of Transportation (Non-Medical): No  Recent Concern: Transportation Needs - Unmet Transportation Needs (09/03/2023)   PRAPARE - Administrator, Civil Service (Medical): Yes    Lack of Transportation (Non-Medical): Yes  Physical Activity: Not on file  Stress: Not on file  Social Connections: Not on file  Intimate  Partner Violence: Not At Risk (10/16/2023)   Humiliation, Afraid, Rape, and Kick questionnaire    Fear of Current or Ex-Partner: No    Emotionally Abused: No    Physically Abused: No    Sexually Abused: No    Family History:    Family History  Problem Relation Age of Onset   Coronary artery disease Mother        alive   Other Other        2 sisters alive and well   Alcohol abuse Father        died @ 35   Other Paternal Grandmother        PACER     ROS:  Please see the history of present illness.   All other ROS reviewed and negative.     Physical Exam/Data:   Vitals:   11/23/23 0744 11/23/23 1030 11/23/23 1247 11/23/23 1251  BP:  128/84 (!) 112/57   Pulse:  90 84   Resp:   18   Temp: 98 F (36.7 C)   98.1 F (36.7 C)  TempSrc: Oral   Oral  SpO2:   99%   Weight:      Height:        Intake/Output Summary (Last 24 hours) at 11/23/2023 1517 Last data filed at 11/23/2023 0445 Gross per 24 hour  Intake 350 ml  Output --  Net 350 ml      11/23/2023    1:09 AM 11/14/2023   10:39 AM 11/14/2023    4:48 AM  Last 3 Weights  Weight (lbs) 197 lb 15.6 oz 197 lb 15.6 oz 198 lb  Weight (kg) 89.8 kg 89.8 kg 89.812 kg     Body mass index is 32.94 kg/m.  General:  Well nourished, well developed, in no acute distress HEENT: normal Neck: no JVD Vascular: No carotid bruits; Distal pulses 2+ bilaterally Cardiac:  normal S1, S2; RRR; no murmur  Lungs:  clear to auscultation bilaterally, no wheezing, rhonchi or rales  Abd: soft, nontender, no hepatomegaly  Ext: no edema Musculoskeletal:  No deformities, BUE and BLE strength normal and equal Skin: warm and dry  Neuro:  CNs  2-12 intact, no focal abnormalities noted Psych:  Normal affect   EKG:  The EKG was personally reviewed and demonstrates:  NSR with diffuse nonspecific ST-T abnormalities Telemetry:  Telemetry was personally reviewed and demonstrates:  SR with intermittent PVCs  Relevant CV Studies: See  below  Laboratory Data:  High Sensitivity Troponin:   Recent Labs  Lab 11/14/23 0714 11/22/23 2229 11/23/23 0141 11/23/23 0458 11/23/23 0828  TROPONINIHS 17 14 33* 48* 32*     Chemistry Recent Labs  Lab 11/22/23 2229  NA 138  K 2.6*  CL 106  CO2 19*  GLUCOSE 124*  BUN <5*  CREATININE 0.69  CALCIUM 8.9  MG 1.9  GFRNONAA >60  ANIONGAP 13    Recent Labs  Lab 11/23/23 0900  PROT 6.6  ALBUMIN 2.8*  AST 85*  ALT 26  ALKPHOS 188*  BILITOT 3.2*   Lipids No results for input(s): "CHOL", "TRIG", "HDL", "LABVLDL", "LDLCALC", "CHOLHDL" in the last 168 hours.  Hematology Recent Labs  Lab 11/22/23 2229  WBC 7.0  RBC 3.49*  HGB 10.5*  HCT 31.9*  MCV 91.4  MCH 30.1  MCHC 32.9  RDW 18.1*  PLT 174   Thyroid No results for input(s): "TSH", "FREET4" in the last 168 hours.  BNPNo results for input(s): "BNP", "PROBNP" in the last 168 hours.  DDimer No results for input(s): "DDIMER" in the last 168 hours.   Radiology/Studies:  DG Chest 2 View  Result Date: 11/22/2023 CLINICAL DATA:  Chest pain EXAM: CHEST - 2 VIEW COMPARISON:  11/14/2023 FINDINGS: Cardiomegaly. No confluent airspace opacity or effusion. No overt edema. No acute bony abnormality. IMPRESSION: Cardiomegaly.  No active disease. Electronically Signed   By: Charlett Nose M.D.   On: 11/22/2023 23:05     Assessment and Plan:   NSTEMI Chest pain Known severe CAD -in the setting of cocaine and alcohol use -reviewed cath from 11/06/22, which showed severe 2V CAD with CTO mid LCX and CTO mid RCA. LAD stent patient. Collaterals noted. No intervention at that time. -last echo 03/2023 with EF 55-60% -continue aspirin, rosuvastatin -no beta blocker given cocaine use -no nitrate given chronic hypotension -suspect acute symptoms 2/2 vasospasm from cocaine -would not pursue cath at this time, manage medically -given that the pain began before he used cocaine, has not improved with angina medication, suspect there  is a component of noncardiac pain present as well  Chronic hypotension -on midodrine -limits use of medications  Risk Assessment/Risk Scores:     TIMI Risk Score for Unstable Angina or Non-ST Elevation MI:   The patient's TIMI risk score is 5, which indicates a 26% risk of all cause mortality, new or recurrent myocardial infarction or need for urgent revascularization in the next 14 days.          For questions or updates, please contact [facility] Please consult [email or web address] for contact info under    Signed, Jodelle Red, MD  11/23/2023 3:17 PM

## 2023-11-23 NOTE — Assessment & Plan Note (Signed)
Recurrent central chest pain x 3 to 4 days Likely cocaine induced Baseline multivessel coronary disease by cardiac catheterization November 2023-on medical therapy Troponin 30s to 40s EKG normal sinus rhythm with nonspecific ST and T wave changes Status post full dose aspirin Discussed importance of polysubstance abuse abstinence Will trend troponin Risk stratification labs Last 2D echo February and April of this year-deferred for now [facility] consult cardiology for further recommendations

## 2023-11-23 NOTE — Plan of Care (Signed)

## 2023-11-23 NOTE — ED Provider Notes (Signed)
[facility] Provider Note    Event Date/Time   First MD Initiated Contact with Patient 11/23/23 0110     (approximate)   History   Chest Pain   HPI Ethan Wells is a 52 y.o. male whose medical history includes polysubstance abuse including alcohol and cocaine, chronic chest pain, CHF with preserved ejection fraction, COPD, elevated LFTs, etc.  This is his 10th ED visit in 6 months with 4 prior admissions.  He presents tonight for evaluation of chest pain.  He said it feels the same as all the other times.  He said that everyone always tells him there is nothing wrong but "you cannot be inside me to see how it feels".  He admits to drinking tonight but claims he has not had any alcohol for 2 weeks.  He said he decided to drink today because "it is the holidays and all."  He said the chest pain is sharp and feels similar to before.  No numbness or weakness in his extremities.     Physical Exam   Triage Vital Signs: ED Triage Vitals  Encounter Vitals Group     BP 11/22/23 2227 122/68     Systolic BP Percentile --      Diastolic BP Percentile --      Pulse Rate 11/22/23 2227 89     Resp 11/22/23 2227 18     Temp 11/22/23 2227 97.8 F (36.6 C)     Temp Source 11/22/23 2227 Oral     SpO2 11/22/23 2227 95 %     Weight 11/23/23 0109 89.8 kg (197 lb 15.6 oz)     Height 11/23/23 0109 1.651 m (5\' 5" )     Head Circumference --      Peak Flow --      Pain Score 11/23/23 0107 10     Pain Loc --      Pain Education --      Exclude from Growth Chart --     Most recent vital signs: Vitals:   11/23/23 0730 11/23/23 0744  BP: (!) 110/57   Pulse: 85   Resp: 16   Temp:  98 F (36.7 C)  SpO2: 100%     General: Awake, alert, disheveled, slightly slurred speech initially. CV:  Good peripheral perfusion.  Regular rate and rhythm, normal heart sounds. Resp:  Normal effort. Speaking easily and comfortably, no accessory muscle usage nor intercostal  retractions.  Lungs are clear to auscultation.  Initially he seemed to be having some gasping episodes but then I realized he was having hiccups. Abd:  No distention.  No tenderness to palpation of the abdomen. Other:  Alternatingly polite/respectful and belligerent attitude.  No psychiatric warning signs of emergent psychiatric issue, does not meet IVC criteria.   ED Results / Procedures / Treatments   Labs (all labs ordered are listed, but only abnormal results are displayed) Labs Reviewed  BASIC METABOLIC PANEL - Abnormal; Notable for the following components:      Result Value   Potassium 2.6 (*)    CO2 19 (*)    Glucose, Bld 124 (*)    BUN <5 (*)    All other components within normal limits  CBC - Abnormal; Notable for the following components:   RBC 3.49 (*)    Hemoglobin 10.5 (*)    HCT 31.9 (*)    RDW 18.1 (*)    All other components within normal limits  URINE DRUG SCREEN, QUALITATIVE [facility]  ONLY) - Abnormal; Notable for the following components:   Cocaine Metabolite,Ur  POSITIVE (*)    All other components within normal limits  TROPONIN I (HIGH SENSITIVITY) - Abnormal; Notable for the following components:   Troponin I (High Sensitivity) 33 (*)    All other components within normal limits  TROPONIN I (HIGH SENSITIVITY) - Abnormal; Notable for the following components:   Troponin I (High Sensitivity) 48 (*)    All other components within normal limits  MAGNESIUM  TROPONIN I (HIGH SENSITIVITY)     EKG  ED ECG REPORT I, Loleta Rose, the attending physician, personally viewed and interpreted this ECG.  Date: 11/22/2023 EKG Time: 22: 22 Rate: 96 Rhythm: normal sinus rhythm QRS Axis: normal Intervals: normal ST/T Wave abnormalities: Non-specific ST segment / T-wave changes, but no clear evidence of acute ischemia. Narrative Interpretation: no definitive evidence of acute ischemia; does not meet STEMI criteria.    RADIOLOGY I viewed and interpreted the  patient's two-view chest x-ray.  No evidence of pneumonia or widened mediastinum.  I also read the radiologist's report, which confirmed no acute findings.   PROCEDURES:  Critical Care performed: No  .1-3 Lead EKG Interpretation  Performed by: Loleta Rose, MD Authorized by: Loleta Rose, MD     ECG rate:  90   ECG rate assessment: normal     Rhythm: sinus rhythm     Ectopy: none     Conduction: normal   .Critical Care  Performed by: Loleta Rose, MD Authorized by: Loleta Rose, MD   Critical care provider statement:    Critical care time (minutes):  30   Critical care time was exclusive of:  Separately billable procedures and treating other patients   Critical care was necessary to treat or prevent imminent or life-threatening deterioration of the following conditions: possible NSTEMI requiring LMWH.   Critical care was time spent personally by me on the following activities:  Development of treatment plan with patient or surrogate, evaluation of patient's response to treatment, examination of patient, obtaining history from patient or surrogate, ordering and performing treatments and interventions, ordering and review of laboratory studies, ordering and review of radiographic studies, pulse oximetry, re-evaluation of patient's condition and review of old charts     IMPRESSION / MDM / ASSESSMENT AND PLAN / ED COURSE  I reviewed the triage vital signs and the nursing notes.                              Differential diagnosis includes, but is not limited to, chronic pain, ACS, PE, pneumonia, pneumothorax, COPD exacerbation.  Patient's presentation is most consistent with acute presentation with potential threat to life or bodily function.  Labs/studies ordered: High-sensitivity troponin x 2, CBC, BMP, magnesium, two-view chest x-ray, EKG  Interventions/Medications given:  Medications  potassium chloride SA (KLOR-CON M) CR tablet 40 mEq (40 mEq Oral Given 11/23/23 0119)   acetaminophen (TYLENOL) tablet 500 mg (500 mg Oral Given 11/23/23 0336)  sodium chloride 0.9 % bolus 500 mL (0 mLs Intravenous Stopped 11/23/23 0445)  enoxaparin (LOVENOX) injection 90 mg (90 mg Subcutaneous Given 11/23/23 0737)  aspirin chewable tablet 324 mg (324 mg Oral Given 11/23/23 0735)    (Note:  hospital course my include additional interventions and/or labs/studies not listed above.)   Patient has extensive chronic medical issues but frequent presentations for chronic chest pain which I believe is most likely tonight.  Vital signs are stable.  He was a little bit tachypneic initially but he was somewhat worked up when he first came in.  His potassium level is 2.6 which could be sequela of vomiting (he claims to have vomited several times over the last few days), and could also be because he claims to have used his nebulizer treatment several times before coming to the emergency department.  I ordered 40 mill equivalents of potassium by mouth which he tolerated without difficulty.  His labs are otherwise unremarkable.  Second troponin is pending.  Anticipate discharge for outpatient follow-up if there is no evidence of emergent medical condition based on stable troponins.     Clinical Course as of 11/23/23 0747  Sun Nov 23, 2023  1610 Patient's second high-sensitivity troponin went up to 33.  However, this is still quite low.  I looked at his last heart catheterization which was almost exactly a year ago.  He has multivessel disease but at the time there was nothing amenable to stent placement.  He has had elevated troponins in the past, though not to the level of 33.  It is unlikely that cardiology would be aggressive about intervention including another catheterization.  I think it is very reasonable given his well-documented history to check a third high-sensitivity troponin to determine if the trend is to continue going up or if it has stabilized.  Additionally, while he is resting, his  blood pressure is a bit low.  I verified in his echocardiogram history that he has a preserved ejection fraction despite the history of CHF, so I ordered a 500 mL normal saline bolus to see if this improves the pressure somewhat without overloading him from a volume perspective.  I also ordered 500 mg of oral Tylenol. [CF]  0557 Repeat high-sensitivity troponin has continued to go up, this time to 48.  At this point I think it is reasonable and appropriate to admit him for further evaluation by cardiology.  I think there is little benefit to a heparin infusion but I am ordering Lovenox 1 mg/kg as an alternative.  I am consulting the hospitalist for admission.  No indication for emergent consultation of cardiology. [CF]  0604 Of note, when I updated the patient about the plan, he admitted that he used cocaine 3 days ago. [CF]  9604 Consulted Dr. Para March with the hospitalist service who will admit [CF]    Clinical Course User Index [CF] Loleta Rose, MD     FINAL CLINICAL IMPRESSION(S) / ED DIAGNOSES   Final diagnoses:  Chest pain, unspecified type  Hypokalemia  Demand ischemia (HCC)  Elevated troponin level  Coronary artery disease involving native heart with refractory angina pectoris, unspecified vessel or lesion type [facility])     Rx / DC Orders   ED Discharge Orders     None        Note:  This document was prepared using Dragon voice recognition software and may include unintentional dictation errors.   Loleta Rose, MD 11/23/23 [phone]

## 2023-11-23 NOTE — Assessment & Plan Note (Signed)
Stable from respiratory standpoint Continue home inhalers 

## 2023-11-23 NOTE — Assessment & Plan Note (Signed)
2D echo April 2023 with EF of 55 to 60% and grade 2 diastolic dysfunction Appears fairly euvolemic at present Monitor

## 2023-11-23 NOTE — Assessment & Plan Note (Signed)
PPI ?

## 2023-11-23 NOTE — Assessment & Plan Note (Signed)
Troponin 30s to 40s in the setting of chest pain with concurrent cocaine use Baseline multivessel coronary disease medically managed Suspect substance abuse demand ischemia Status post full dose aspirin As needed nitroglycerin Trend troponin Follow-up cardiology recommendations

## 2023-11-23 NOTE — Discharge Instructions (Signed)
Your workup in the Emergency Department today was reassuring.  We did not find any specific abnormalities.  We recommend you drink plenty of fluids, take your regular medications and/or any new ones prescribed today, and follow up with the doctor(s) listed in these documents as recommended.  Return to the Emergency Department if you develop new or worsening symptoms that concern you.  

## 2023-11-23 NOTE — H&P (Signed)
History and Physical    [name]:[id number] DOB: 1971/03/25 DOA: 11/23/2023 DOS: the patient was seen and examined on 11/23/2023 PCP: [name], NP  Patient coming from: Home  Chief Complaint:  Chief Complaint  Patient presents with   Chest Pain   HPI: Ethan Wells is a 52 y.o. male with medical history significant of  52 y.o. male with medical history significant of liver cirrhosis s/p TIPS, superior mesenteric artery stenosis s/p stenting, CAD, STEMI s/p PCI with stenting, HFpEF, ischemic cardiomyopathy, prior V-fib cardiac arrest, hypertension, hyperlipidemia, GERD, migraine headaches polysubstance abuse (cocaine, alcohol, marijuana and tobacco), orthostatic hypotension, Addison's/adrenal insufficiency, chronic narcotic dependence, and recurrent GI bleeds presenting with chest pain, cocaine use, NSTEMI.  Patient reports roughly 3 days of central chest pain.  Mild nausea and diaphoresis.  Admits to cocaine binge roughly 1 day prior to onset of symptoms.  No fevers or chills.  No abdominal pain.  Noted baseline history of coronary disease status post stenting.  Had November 2023 cardiac cath showing multivessel disease with recommendation of medical management.  Patient reports compliance with medication at home.  Still smoking.  Also with regular alcohol intake.  Denies any IV drug use.  Minimal orthopnea or PND.  Chest pain has persisted at home despite as needed antianginal medication. Presented to the ER afebrile, hemodynamically stable.  Systolic pressures 90s to 120s over 50s to 60s.  Satting well on room air.  White count 7, hemoglobin 10.5, platelets 174, urine drug screen positive for cocaine, troponin 30s to 40s.  Creatinine 0.7.  Potassium 2.6.  Magnesium 1.9.  EKG normal sinus rhythm and nonspecific ST and T wave changes.  Chest x-ray with cardiomegaly. Review of Systems: As mentioned in the history of present illness. All other systems reviewed  and are negative. Past Medical History:  Diagnosis Date   CAD (coronary artery disease)    a. 01/2011 Anterior STEMI/Cath/PCI: LM nl, LAD 100d (3.5x12mm Vision BMS placed), LCX 39m, RI 50, RCA min irregs, EF 40% w/ apical, inferoapical HK; b. 10/2022 NSTSEMI/Cath: LM nl, LAD 5p/m, RI 85, LCX 157m, OM1 mild dzs, OM3 100 fills via L->L collats from dLAD, RCA 80p, 149m, RPDA fills via collats from LAD. EF 45-50%-->Med Rx.   Cardiac arrest - ventricular fibrillation    a. In setting of STEMI 01/2011   Chronic pain    Cocaine abuse (HCC)    Depression with anxiety    ETOH abuse    a. 6-12 beers/day   GERD (gastroesophageal reflux disease)    GI bleed    Hemorrhoids    Hyperlipidemia    Hypertension    Ischemic cardiomyopathy    a. 06/2011 Echo: EF 45-50%, No rwma; b. 10/2022 Echo: EF 55-60%, no rwma, nl RV fxn.   Marijuana abuse    Migraines    Tobacco abuse    a. 1/2 ppd x 26 yrs   Past Surgical History:  Procedure Laterality Date   COLONOSCOPY  08/09/2021   Procedure: COLONOSCOPY;  Surgeon: Midge Minium, MD;  Location: [facility] ENDOSCOPY;  Service: Endoscopy;;   CORONARY ANGIOPLASTY WITH STENT PLACEMENT     ESOPHAGEAL BANDING  04/08/2023   Procedure: ESOPHAGEAL BANDING;  Surgeon: Tressia Danas, MD;  Location: [facility] ENDOSCOPY;  Service: Gastroenterology;;   ESOPHAGOGASTRODUODENOSCOPY N/A 05/30/2022   Procedure: ESOPHAGOGASTRODUODENOSCOPY (EGD);  Surgeon: Toney Reil, MD;  Location: [facility] ENDOSCOPY;  Service: Gastroenterology;  Laterality: N/A;   ESOPHAGOGASTRODUODENOSCOPY (EGD) WITH PROPOFOL N/A 03/01/2021   Procedure: ESOPHAGOGASTRODUODENOSCOPY (  EGD) WITH PROPOFOL;  Surgeon: Toney Reil, MD;  Location: [facility] SURGERY CNTR;  Service: Endoscopy;  Laterality: N/A;   ESOPHAGOGASTRODUODENOSCOPY (EGD) WITH PROPOFOL N/A 08/09/2021   Procedure: ESOPHAGOGASTRODUODENOSCOPY (EGD) WITH PROPOFOL;  Surgeon: Midge Minium, MD;  Location: [facility] ENDOSCOPY;  Service: Endoscopy;  Laterality: N/A;    ESOPHAGOGASTRODUODENOSCOPY (EGD) WITH PROPOFOL N/A 03/01/2023   Procedure: ESOPHAGOGASTRODUODENOSCOPY (EGD) WITH PROPOFOL;  Surgeon: Jaynie Collins, DO;  Location: [facility] ENDOSCOPY;  Service: Gastroenterology;  Laterality: N/A;   ESOPHAGOGASTRODUODENOSCOPY (EGD) WITH PROPOFOL N/A 04/08/2023   Procedure: ESOPHAGOGASTRODUODENOSCOPY (EGD) WITH PROPOFOL;  Surgeon: Tressia Danas, MD;  Location: [facility] ENDOSCOPY;  Service: Gastroenterology;  Laterality: N/A;   ESOPHAGOGASTRODUODENOSCOPY (EGD) WITH PROPOFOL N/A 08/09/2023   Procedure: ESOPHAGOGASTRODUODENOSCOPY (EGD) WITH PROPOFOL;  Surgeon: Regis Bill, MD;  Location: [facility] ENDOSCOPY;  Service: Endoscopy;  Laterality: N/A;   ESOPHAGOGASTRODUODENOSCOPY (EGD) WITH PROPOFOL N/A 08/19/2023   Procedure: ESOPHAGOGASTRODUODENOSCOPY (EGD) WITH PROPOFOL;  Surgeon: Regis Bill, MD;  Location: [facility] ENDOSCOPY;  Service: Endoscopy;  Laterality: N/A;   EVALUATION UNDER ANESTHESIA WITH HEMORRHOIDECTOMY N/A 10/31/2022   Procedure: EXAM UNDER ANESTHESIA WITH SUTURE LIGATION OF TWO BLEEDING PEDICLES;  Surgeon: Henrene Dodge, MD;  Location: [facility] ORS;  Service: General;  Laterality: N/A;   FLEXIBLE SIGMOIDOSCOPY N/A 10/02/2022   Procedure: FLEXIBLE SIGMOIDOSCOPY;  Surgeon: Meryl Dare, MD;  Location: [facility] ENDOSCOPY;  Service: Gastroenterology;  Laterality: N/A;   HEMOSTASIS CONTROL  08/09/2023   Procedure: HEMOSTASIS CONTROL;  Surgeon: Regis Bill, MD;  Location: [facility] ENDOSCOPY;  Service: Endoscopy;;   IR TIPS  04/11/2023   IR US GUIDE VASC ACCESS RIGHT  04/11/2023   IR US GUIDE VASC ACCESS RIGHT  04/11/2023   IR US GUIDE VASC ACCESS RIGHT  04/11/2023   LEFT HEART CATH AND CORONARY ANGIOGRAPHY N/A 11/04/2022   Procedure: LEFT HEART CATH AND CORONARY ANGIOGRAPHY;  Surgeon: Iran Ouch, MD;  Location: [facility] INVASIVE CV LAB;  Service: Cardiovascular;  Laterality: N/A;   RADIOLOGY WITH ANESTHESIA N/A 04/11/2023   Procedure: IR WITH ANESTHESIA;   Surgeon: Oley Balm, MD;  Location: [facility] OR;  Service: Radiology;  Laterality: N/A;   VISCERAL ANGIOGRAPHY N/A 10/24/2022   Procedure: VISCERAL ANGIOGRAPHY;  Surgeon: Annice Needy, MD;  Location: [facility] INVASIVE CV LAB;  Service: Cardiovascular;  Laterality: N/A;   VISCERAL ARTERY INTERVENTION N/A 05/09/2023   Procedure: VISCERAL ARTERY INTERVENTION;  Surgeon: Renford Dills, MD;  Location: [facility] INVASIVE CV LAB;  Service: Cardiovascular;  Laterality: N/A;   Social History:  reports that he has been smoking cigarettes. He has never used smokeless tobacco. He reports current alcohol use of about 12.0 standard drinks of alcohol per week. He reports current drug use. Drugs: Cocaine and Marijuana.  Allergies  Allergen Reactions   Penicillins Hives and Rash    Tolerated Ceftriaxone 10/2022    Family History  Problem Relation Age of Onset   Coronary artery disease Mother        alive   Other Other        2 sisters alive and well   Alcohol abuse Father        died @ 51   Other Paternal Grandmother        PACER    Prior to Admission medications   Medication Sig Start Date End Date Taking? Authorizing Provider  albuterol (VENTOLIN HFA) 108 (90 Base) MCG/ACT inhaler Inhale 2 puffs into the lungs every 6 (six) hours as needed for wheezing or shortness of breath. 10/15/23   Jessup,  Leonette Most, MD  docusate sodium (COLACE) 100 MG capsule Take 1 capsule (100 mg total) by mouth 2 (two) times daily as needed (if less than 3 BM"s in past 24 hours). 08/25/23   Pennie Banter, DO  doxycycline (VIBRAMYCIN) 100 MG capsule Take 1 capsule (100 mg total) by mouth 2 (two) times daily for 10 days. 11/14/23 11/24/23  Sharman Cheek, MD  feeding supplement (ENSURE ENLIVE / ENSURE PLUS) LIQD Take 237 mLs by mouth 2 (two) times daily between meals. 08/25/23   Esaw Grandchild A, DO  fluticasone furoate-vilanterol (BREO ELLIPTA) 100-25 MCG/ACT AEPB Inhale 1 puff into the lungs daily. 10/19/23   Sunnie Nielsen, DO   furosemide (LASIX) 40 MG tablet Take 1 tablet (40 mg total) by mouth daily. Increase to 1 tablet (40 mg total) by mouth TWICE daily (total daily dose 80 mg) as needed for up to 3 days for increased leg swelling, shortness of breath, weight gain 5+ lbs over 1-2 days. Seek medical care if these symptoms are not improving with increased dose. 10/18/23 12/17/23  Sunnie Nielsen, DO  gabapentin (NEURONTIN) 100 MG capsule Take 1 capsule (100 mg total) by mouth 2 (two) times daily. 10/18/23 11/17/23  Sunnie Nielsen, DO  hydrocortisone (CORTEF) 5 MG tablet Take 1 tablet (5 mg total) by mouth 2 (two) times daily. 10/18/23   Sunnie Nielsen, DO  ipratropium-albuterol (DUONEB) 0.5-2.5 (3) MG/3ML SOLN Take 3 mLs by nebulization every 2 (two) hours as needed (wheeze, SOB). 10/18/23   Sunnie Nielsen, DO  methocarbamol (ROBAXIN) 750 MG tablet Take 1 tablet (750 mg total) by mouth 2 (two) times daily as needed for muscle spasms. 10/18/23   Sunnie Nielsen, DO  midodrine (PROAMATINE) 5 MG tablet Take 1 tablet (5 mg total) by mouth 3 (three) times daily with meals. 10/18/23   Sunnie Nielsen, DO  Nebulizers (COMPRESSOR/NEBULIZER) MISC Nebulizer + mask/hoses per patient preference and insurance coverage, use as directed 10/18/23   Sunnie Nielsen, DO  oxyCODONE (OXY IR/ROXICODONE) 5 MG immediate release tablet Take 1 tablet (5 mg total) by mouth every 8 (eight) hours as needed for severe pain. 09/08/23   Tresa Moore, MD  pantoprazole (PROTONIX) 40 MG tablet Take 1 tablet (40 mg total) by mouth 2 (two) times daily. 10/18/23   Sunnie Nielsen, DO  polyethylene glycol powder (GLYCOLAX/MIRALAX) 17 GM/SCOOP powder Take 17 g by mouth daily as needed (if less than 3 BM"s in past 24 hours). 08/25/23   Esaw Grandchild A, DO  potassium chloride SA (KLOR-CON M) 20 MEQ tablet Take 1 tablet (20 mEq total) by mouth daily. 10/18/23   Sunnie Nielsen, DO  QUEtiapine (SEROQUEL) 50 MG tablet Take 1 tablet (50 mg  total) by mouth at bedtime. 10/18/23 11/17/23  Sunnie Nielsen, DO  Respiratory Therapy Supplies (NEBULIZER/TUBING/MOUTHPIECE) KIT As directed 10/18/23   Sunnie Nielsen, DO  rosuvastatin (CRESTOR) 40 MG tablet TAKE 1 TABLET BY MOUTH EVERY DAY 10/20/23   Medina-Vargas, Avanell Shackleton C, NP    Physical Exam: Vitals:   11/23/23 0258 11/23/23 0508 11/23/23 0730 11/23/23 0744  BP: (!) 94/52 (!) 101/57 (!) 110/57   Pulse:  92 85   Resp:  16 16   Temp:    98 F (36.7 C)  TempSrc:    Oral  SpO2:  98% 100%   Weight:      Height:       Physical Exam Constitutional:      Appearance: He is normal weight.  HENT:     Head: Normocephalic and  atraumatic.     Nose: Nose normal.     Mouth/Throat:     Mouth: Mucous membranes are moist.  Eyes:     Pupils: Pupils are equal, round, and reactive to light.  Cardiovascular:     Rate and Rhythm: Regular rhythm.  Pulmonary:     Effort: Pulmonary effort is normal.     Breath sounds: Normal breath sounds.  Abdominal:     General: Bowel sounds are normal.  Musculoskeletal:        General: Normal range of motion.  Skin:    General: Skin is warm.  Neurological:     General: No focal deficit present.  Psychiatric:        Mood and Affect: Mood normal.     Data Reviewed:  There are no new results to review at this time.  DG Chest 2 View CLINICAL DATA:  Chest pain  EXAM: CHEST - 2 VIEW  COMPARISON:  11/14/2023  FINDINGS: Cardiomegaly. No confluent airspace opacity or effusion. No overt edema. No acute bony abnormality.  IMPRESSION: Cardiomegaly.  No active disease.  Electronically Signed   By: Charlett Nose M.D.   On: 11/22/2023 23:05  Lab Results  Component Value Date   WBC 7.0 11/22/2023   HGB 10.5 (L) 11/22/2023   HCT 31.9 (L) 11/22/2023   MCV 91.4 11/22/2023   PLT 174 11/22/2023   Last metabolic panel Lab Results  Component Value Date   GLUCOSE 124 (H) 11/22/2023   NA 138 11/22/2023   K 2.6 (LL) 11/22/2023   CL 106  11/22/2023   CO2 19 (L) 11/22/2023   BUN <5 (L) 11/22/2023   CREATININE 0.69 11/22/2023   GFRNONAA >60 11/22/2023   CALCIUM 8.9 11/22/2023   PHOS 3.6 08/21/2023   PROT 7.2 11/14/2023   ALBUMIN 3.3 (L) 11/14/2023   BILITOT 3.6 (H) 11/14/2023   ALKPHOS 197 (H) 11/14/2023   AST 67 (H) 11/14/2023   ALT 20 11/14/2023   ANIONGAP 13 11/22/2023    Assessment and Plan: Chest pain Recurrent central chest pain x 3 to 4 days Likely cocaine induced Baseline multivessel coronary disease by cardiac catheterization November 2023-on medical therapy Troponin 30s to 40s EKG normal sinus rhythm with nonspecific ST and T wave changes Status post full dose aspirin Discussed importance of polysubstance abuse abstinence Will trend troponin Risk stratification labs Last 2D echo February and April of this year-deferred for now [facility] consult cardiology for further recommendations   NSTEMI (non-ST elevated myocardial infarction) (HCC) Troponin 30s to 40s in the setting of chest pain with concurrent cocaine use Baseline multivessel coronary disease medically managed Suspect substance abuse demand ischemia Status post full dose aspirin As needed nitroglycerin Trend troponin Follow-up cardiology recommendations  Adrenal insufficiency (Addison's disease) (HCC) Continue home midodrine and Solu-Cortef   Esophageal reflux PPI  Chronic diastolic CHF (congestive heart failure) (HCC) 2D echo April 2023 with EF of 55 to 60% and grade 2 diastolic dysfunction Appears fairly euvolemic at present Monitor  Hypokalemia K 2.6  Mg 1.9  Replete   Cocaine dependence with cocaine-induced disorder (HCC) Cocaine positive today with associated chest pain Concurrent alcohol use Withdrawal protocol Monitor  Tobacco abuse Positive tobacco use Discussed cessation Monitor  COPD (chronic obstructive pulmonary disease) (HCC) Stable from respiratory standpoint Continue home inhalers  Pancytopenia  (HCC) Cell lines fairly stable at present Monitor      Advance Care Planning:   Code Status: Full Code   Consults: Cardiology   Family Communication: No family  at the bedside   Severity of Illness: The appropriate patient status for this patient is OBSERVATION. Observation status is judged to be reasonable and necessary in order to provide the required intensity of service to ensure the patient's safety. The patient's presenting symptoms, physical exam findings, and initial radiographic and laboratory data in the context of their medical condition is felt to place them at decreased risk for further clinical deterioration. Furthermore, it is anticipated that the patient will be medically stable for discharge from the hospital within 2 midnights of admission.   Author: Floydene Flock, MD 11/23/2023 8:40 AM  For on call review [email or web address].

## 2023-11-23 NOTE — Assessment & Plan Note (Signed)
K 2.6  Mg 1.9  Replete

## 2023-11-23 NOTE — ED Notes (Signed)
CCMD called to confirm placement on cardiac monitor.

## 2023-11-23 NOTE — ED Notes (Signed)
Advised nurse that patient has ready bed 

## 2023-11-23 NOTE — Assessment & Plan Note (Signed)
Positive tobacco use Discussed cessation Monitor

## 2023-11-24 ENCOUNTER — Other Ambulatory Visit: Payer: Self-pay

## 2023-11-24 ENCOUNTER — Emergency Department: Payer: Medicare HMO

## 2023-11-24 DIAGNOSIS — Z5321 Procedure and treatment not carried out due to patient leaving prior to being seen by health care provider: Secondary | ICD-10-CM | POA: Insufficient documentation

## 2023-11-24 DIAGNOSIS — I25119 Atherosclerotic heart disease of native coronary artery with unspecified angina pectoris: Secondary | ICD-10-CM

## 2023-11-24 DIAGNOSIS — F1429 Cocaine dependence with unspecified cocaine-induced disorder: Secondary | ICD-10-CM | POA: Diagnosis not present

## 2023-11-24 DIAGNOSIS — R079 Chest pain, unspecified: Secondary | ICD-10-CM | POA: Insufficient documentation

## 2023-11-24 DIAGNOSIS — I2489 Other forms of acute ischemic heart disease: Secondary | ICD-10-CM

## 2023-11-24 DIAGNOSIS — Z720 Tobacco use: Secondary | ICD-10-CM

## 2023-11-24 DIAGNOSIS — I5032 Chronic diastolic (congestive) heart failure: Secondary | ICD-10-CM | POA: Diagnosis not present

## 2023-11-24 DIAGNOSIS — R7989 Other specified abnormal findings of blood chemistry: Secondary | ICD-10-CM

## 2023-11-24 DIAGNOSIS — R072 Precordial pain: Secondary | ICD-10-CM | POA: Diagnosis not present

## 2023-11-24 DIAGNOSIS — K2100 Gastro-esophageal reflux disease with esophagitis, without bleeding: Secondary | ICD-10-CM | POA: Diagnosis not present

## 2023-11-24 LAB — BASIC METABOLIC PANEL
Anion gap: 10 (ref 5–15)
Anion gap: 7 (ref 5–15)
BUN: 7 mg/dL (ref 6–20)
BUN: 7 mg/dL (ref 6–20)
CO2: 19 mmol/L — ABNORMAL LOW (ref 22–32)
CO2: 20 mmol/L — ABNORMAL LOW (ref 22–32)
Calcium: 8.3 mg/dL — ABNORMAL LOW (ref 8.9–10.3)
Calcium: 8.9 mg/dL (ref 8.9–10.3)
Chloride: 107 mmol/L (ref 98–111)
Chloride: 108 mmol/L (ref 98–111)
Creatinine, Ser: 0.5 mg/dL — ABNORMAL LOW (ref 0.61–1.24)
Creatinine, Ser: 0.6 mg/dL — ABNORMAL LOW (ref 0.61–1.24)
GFR, Estimated: >60 mL/min (ref >60)
GFR, Estimated: >60 mL/min (ref >60)
Glucose, Bld: 105 mg/dL — ABNORMAL HIGH (ref 70–99)
Glucose, Bld: 129 mg/dL — ABNORMAL HIGH (ref 70–99)
Potassium: 3.5 mmol/L (ref 3.5–5.1)
Potassium: 3.1 mmol/L — ABNORMAL LOW (ref 3.5–5.1)
Sodium: 136 mmol/L (ref 135–145)
Sodium: 135 mmol/L (ref 135–145)

## 2023-11-24 LAB — CBC
HCT: 30.3 % — ABNORMAL LOW (ref 39.0–52.0)
Hemoglobin: 9.8 g/dL — ABNORMAL LOW (ref 13.0–17.0)
MCH: 29.9 pg (ref 26.0–34.0)
MCHC: 32.3 g/dL (ref 30.0–36.0)
MCV: 92.4 fL (ref 80.0–100.0)
Platelets: 159 10*3/uL (ref 150–400)
RBC: 3.28 MIL/uL — ABNORMAL LOW (ref 4.22–5.81)
RDW: 17.7 % — ABNORMAL HIGH (ref 11.5–15.5)
WBC: 6.7 10*3/uL (ref 4.0–10.5)
nRBC: 0 % (ref 0.0–0.2)

## 2023-11-24 LAB — GLUCOSE, CAPILLARY
Glucose-Capillary: 104 mg/dL — ABNORMAL HIGH (ref 70–99)
Glucose-Capillary: 112 mg/dL — ABNORMAL HIGH (ref 70–99)

## 2023-11-24 LAB — TROPONIN I (HIGH SENSITIVITY)
Troponin I (High Sensitivity): 16 ng/L (ref <18)
Troponin I (High Sensitivity): 15 ng/L (ref <18)

## 2023-11-24 MED ORDER — ISOSORBIDE MONONITRATE ER 30 MG PO TB24
15.0000 mg | ORAL_TABLET | Freq: Every day | ORAL | 1 refills | Status: DC
  Filled 2023-11-24 (×2): qty 30, 60d supply, fill #0

## 2023-11-24 MED ORDER — ISOSORBIDE MONONITRATE ER 30 MG PO TB24
15.0000 mg | ORAL_TABLET | Freq: Every day | ORAL | Status: DC
Start: 2023-11-24 — End: 2023-11-24
  Administered 2023-11-24: 15 mg via ORAL
  Filled 2023-11-24: qty 1

## 2023-11-24 MED ORDER — ISOSORBIDE MONONITRATE ER 30 MG PO TB24: 15.0000 mg | ORAL_TABLET | Freq: Every day | ORAL | 1 refills | Status: DC

## 2023-11-24 MED ORDER — ROSUVASTATIN CALCIUM 10 MG PO TABS
40.0000 mg | ORAL_TABLET | Freq: Every day | ORAL | Status: DC
  Administered 2023-11-24: 40 mg via ORAL
  Filled 2023-11-24: qty 4

## 2023-11-24 NOTE — Progress Notes (Signed)
Discharge instructions given to and reviewed with patient. Patient verbalized understanding of all discharge instructions including follow up care, changes to home medication list and new prescription. New prescription for Isosorbide brought to patient's bedside by [facility] pharmacy.

## 2023-11-24 NOTE — ED Triage Notes (Signed)
Pt to ED via [facility] from home c/o chest pain. Pt was discharged from hospital about 4 hra sgo. Pain has been going on since Thursday and has not gone away. Pt given 324mg  aspirin by EMS

## 2023-11-24 NOTE — TOC Progression Note (Signed)
Transition of Care [facility]) - Progression Note    Patient Details  Name: Ethan Wells MRN: [id number] Date of Birth: 05-21-1971  [facility] CM/SW Contact  Colette Ribas, LCSWA Phone Number: 11/24/2023, 4:41 PM  Clinical Narrative:     CSW received message from RN for voucher, completed and emailed to nurse. CSW called [facility] ETA 5:10pm       Expected Discharge Plan and Services         Expected Discharge Date: 11/24/23                                     Social Determinants of Health (SDOH) Interventions SDOH Screenings   Food Insecurity: No Food Insecurity (11/23/2023)  Housing: Patient Unable To Answer (11/23/2023)  Transportation Needs: Unmet Transportation Needs (11/24/2023)  Utilities: Not At Risk (11/23/2023)  Alcohol Screen: Medium Risk (10/01/2022)  Tobacco Use: High Risk (11/22/2023)    Readmission Risk Interventions    10/16/2023   12:56 PM 08/11/2023   10:35 AM 02/24/2023    3:12 PM  Readmission Risk Prevention Plan  Transportation Screening Complete Complete Complete  Medication Review Oceanographer) Complete Complete Complete  PCP or Specialist appointment within 3-5 days of discharge Complete Complete   HRI or Home Care Consult Patient refused    SW Recovery Care/Counseling Consult Patient refused Complete   Palliative Care Screening Patient Refused Not Applicable Not Applicable  Skilled Nursing Facility Patient Refused Not Applicable Not Applicable

## 2023-11-24 NOTE — ED Triage Notes (Signed)
EMS brings pt in from home for c/o CP since Thursday

## 2023-11-24 NOTE — Discharge Summary (Signed)
Ethan Wells HQI:[id number] DOB: 06-09-1971 DOA: 11/23/2023  PCP: [name], NP  Admit date: 11/23/2023 Discharge date: 11/24/2023  Time spent: 35 minutes  Recommendations for Outpatient Follow-up:  Pcp and cardiology f/u Substance abuse treatment     Discharge Diagnoses:  Principal Problem:   Precordial chest pain Active Problems:   Demand ischemia (HCC)   Adrenal insufficiency (Addison's disease) (HCC)   Esophageal reflux   Coronary artery disease involving native coronary artery of native heart with angina pectoris (HCC)   Chronic diastolic CHF (congestive heart failure) (HCC)   Hypokalemia   Cocaine dependence with cocaine-induced disorder (HCC)   Tobacco abuse   Ischemic cardiomyopathy   Elevated troponin   Pancytopenia (HCC)   COPD (chronic obstructive pulmonary disease) (HCC)   Discharge Condition: stable  Diet recommendation: heart healthy  Filed Weights   11/23/23 0109  Weight: 89.8 kg    History of present illness:  From admission h and p Ethan Wells is a 52 y.o. male with medical history significant of  52 y.o. male with medical history significant of liver cirrhosis s/p TIPS, superior mesenteric artery stenosis s/p stenting, CAD, STEMI s/p PCI with stenting, HFpEF, ischemic cardiomyopathy, prior V-fib cardiac arrest, hypertension, hyperlipidemia, GERD, migraine headaches polysubstance abuse (cocaine, alcohol, marijuana and tobacco), orthostatic hypotension, Addison's/adrenal insufficiency, chronic narcotic dependence, and recurrent GI bleeds presenting with chest pain, cocaine use, NSTEMI.  Patient reports roughly 3 days of central chest pain.  Mild nausea and diaphoresis.  Admits to cocaine binge roughly 1 day prior to onset of symptoms.  No fevers or chills.  No abdominal pain.  Noted baseline history of coronary disease status post stenting.  Had November 2023 cardiac cath showing multivessel disease with recommendation of medical  management.  Patient reports compliance with medication at home.  Still smoking.  Also with regular alcohol intake.  Denies any IV drug use.  Minimal orthopnea or PND.  Chest pain has persisted at home despite as needed antianginal medication.   Hospital Course:  Patient presents with chest pain and mild troponin elevation. This in the setting of alcohol and cocaine abuse, positive for both on admission. Does have a history of CAD seen on 10/2022 cath. Troponin normalized on hospital day 1. Cardiology consulted, think likely demand ischemia, possible cocaine vasospasm. Labs and vitals stable, no acute findings on EKG, cleared for discharge home. Imdur added to home regimen but compliance with home meds and follow-up appointments is poor, with his ongoing substance abuse a driving factor. Substance abuse resources were shared with the patient. He showed no signs of alcohol withdrawal.   Procedures: none   Consultations: cardiology  Discharge Exam: Vitals:   11/24/23 1204 11/24/23 1435  BP: (!) 110/55 (!) 107/47  Pulse: 84 70  Resp: 18 16  Temp: 97.8 F (36.6 C) 98.7 F (37.1 C)  SpO2: 99% 96%    General: NAD Cardiovascular: RRR, soft systolic murmur Respiratory: CTAB Abdomen: soft, obese Ext: warm  Discharge Instructions   Discharge Instructions     Diet - low sodium heart healthy   Complete by: As directed    Increase activity slowly   Complete by: As directed       Allergies as of 11/24/2023       Reactions   Penicillins Hives, Rash   Tolerated Ceftriaxone 10/2022        Medication List     STOP taking these medications    doxycycline 100 MG capsule Commonly known as: VIBRAMYCIN  hydrocortisone 5 MG tablet Commonly known as: CORTEF   methocarbamol 750 MG tablet Commonly known as: ROBAXIN   oxyCODONE 5 MG immediate release tablet Commonly known as: Oxy IR/ROXICODONE       TAKE these medications    albuterol 108 (90 Base) MCG/ACT  inhaler Commonly known as: VENTOLIN HFA Inhale 2 puffs into the lungs every 6 (six) hours as needed for wheezing or shortness of breath.   Compressor/Nebulizer Misc Nebulizer + mask/hoses per patient preference and insurance coverage, use as directed   docusate sodium 100 MG capsule Commonly known as: COLACE Take 1 capsule (100 mg total) by mouth 2 (two) times daily as needed (if less than 3 BM"s in past 24 hours).   feeding supplement Liqd Take 237 mLs by mouth 2 (two) times daily between meals.   fluticasone furoate-vilanterol 100-25 MCG/ACT Aepb Commonly known as: BREO ELLIPTA Inhale 1 puff into the lungs daily.   furosemide 40 MG tablet Commonly known as: Lasix Take 1 tablet (40 mg total) by mouth daily. Increase to 1 tablet (40 mg total) by mouth TWICE daily (total daily dose 80 mg) as needed for up to 3 days for increased leg swelling, shortness of breath, weight gain 5+ lbs over 1-2 days. Seek medical care if these symptoms are not improving with increased dose.   gabapentin 100 MG capsule Commonly known as: NEURONTIN Take 1 capsule (100 mg total) by mouth 2 (two) times daily.   ipratropium-albuterol 0.5-2.5 (3) MG/3ML Soln Commonly known as: DUONEB Take 3 mLs by nebulization every 2 (two) hours as needed (wheeze, SOB).   isosorbide mononitrate 30 MG 24 hr tablet Commonly known as: IMDUR Take 0.5 tablets (15 mg total) by mouth daily.   midodrine 5 MG tablet Commonly known as: PROAMATINE Take 1 tablet (5 mg total) by mouth 3 (three) times daily with meals.   Nebulizer/Tubing/Mouthpiece Kit As directed   pantoprazole 40 MG tablet Commonly known as: PROTONIX Take 1 tablet (40 mg total) by mouth 2 (two) times daily.   polyethylene glycol powder 17 GM/SCOOP powder Commonly known as: GLYCOLAX/MIRALAX Take 17 g by mouth daily as needed (if less than 3 BM"s in past 24 hours).   potassium chloride SA 20 MEQ tablet Commonly known as: KLOR-CON M Take 1 tablet (20 mEq  total) by mouth daily.   QUEtiapine 50 MG tablet Commonly known as: SEROQUEL Take 1 tablet (50 mg total) by mouth at bedtime.   rosuvastatin 40 MG tablet Commonly known as: CRESTOR TAKE 1 TABLET BY MOUTH EVERY DAY       Allergies  Allergen Reactions   Penicillins Hives and Rash    Tolerated Ceftriaxone 10/2022    Follow-up Information     Medina-Vargas, Monina C, NP. Call .   Specialty: Internal Medicine Why: for a follow up appointment Contact information: [address] [place] [address] [phone]         Kathleene Hazel, MD Follow up.   Specialty: Cardiology Contact information: [address] [place] [address] [phone]                  The results of significant diagnostics from this hospitalization (including imaging, microbiology, ancillary and laboratory) are listed below for reference.    Significant Diagnostic Studies: DG Chest 2 View  Result Date: 11/22/2023 CLINICAL DATA:  Chest pain EXAM: CHEST - 2 VIEW COMPARISON:  11/14/2023 FINDINGS: Cardiomegaly. No confluent airspace opacity or effusion. No overt edema. No acute bony  abnormality. IMPRESSION: Cardiomegaly.  No active disease. Electronically Signed   By: Charlett Nose M.D.   On: 11/22/2023 23:05   CT Angio Chest/Abd/Pel for Dissection W and/or Wo Contrast  Result Date: 11/14/2023 CLINICAL DATA:  Chest pain, vomiting, pain radiating to back EXAM: CT ANGIOGRAPHY CHEST, ABDOMEN AND PELVIS TECHNIQUE: Non-contrast CT of the chest was initially obtained. Multidetector CT imaging through the chest, abdomen and pelvis was performed using the standard protocol during bolus administration of intravenous contrast. Multiplanar reconstructed images and MIPs were obtained and reviewed to evaluate the vascular anatomy. RADIATION DOSE REDUCTION: This exam was performed according to the departmental dose-optimization program which includes automated exposure control,  adjustment of the mA and/or kV according to patient size and/or use of iterative reconstruction technique. CONTRAST:  OMNIPAQUE IOHEXOL 350 MG/ML SOLN COMPARISON:  10/16/2023 FINDINGS: CTA CHEST FINDINGS Cardiovascular: No evidence of thoracic aortic aneurysm or dissection. Dilated main pulmonary artery measuring 3.9 cm, consistent with pulmonary arterial hypertension. There is technically adequate opacification of the pulmonary vasculature, with no filling defects or pulmonary emboli identified. The heart is enlarged without pericardial effusion. Atherosclerosis of the aorta and coronary vasculature again noted. Mediastinum/Nodes: No enlarged mediastinal, hilar, or axillary lymph nodes. Thyroid gland, trachea, and esophagus demonstrate no significant findings. Lungs/Pleura: No acute airspace disease, effusion, or pneumothorax. Central airways are patent. Musculoskeletal: No acute or destructive bony abnormalities. Reconstructed images demonstrate no additional findings. Review of the MIP images confirms the above findings. CTA ABDOMEN AND PELVIS FINDINGS VASCULAR Aorta: Normal caliber aorta without aneurysm, dissection, vasculitis or significant stenosis. Stable atherosclerosis. Celiac: Patent stent within the celiac trunk. Distal branches are unremarkable and opacify normally. SMA: Patent stent within the SMA lumen. Distal branches are unremarkable and opacify normally. Renals: Single right and duplicated left renal arteries. No significant stenosis, aneurysm, dissection, or vasculitis. No evidence of fibromuscular dysplasia. IMA: Patent without evidence of aneurysm, dissection, vasculitis or significant stenosis. Inflow: Patent without evidence of aneurysm, dissection, vasculitis or significant stenosis. Veins: Evaluation of the venous structures is limited due to timing of contrast bolus. Indwelling TIPS is identified. Review of the MIP images confirms the above findings. NON-VASCULAR Hepatobiliary: Stable  cirrhosis. Exophytic 5.6 cm mass off the inferior margin right lobe liver unchanged, previously assessed by MRI. Please see MRI 06/30/2023 for a full description findings as well as follow-up recommendations. No new liver abnormalities. Gallbladder is unremarkable. Pancreas: Unremarkable. No pancreatic ductal dilatation or surrounding inflammatory changes. Spleen: Normal in size without focal abnormality. Adrenals/Urinary Tract: Adrenal glands are unremarkable. Kidneys are normal, without renal calculi, focal lesion, or hydronephrosis. Bladder is unremarkable. Stomach/Bowel: No bowel obstruction or ileus. No bowel wall thickening or inflammatory change. Lymphatic: No pathologic adenopathy within the abdomen or pelvis. Reproductive: Prostate is unremarkable. Other: No free fluid or free intraperitoneal gas. No abdominal wall hernia. Musculoskeletal: No acute or destructive bony abnormalities. Reconstructed images demonstrate no additional findings. Review of the MIP images confirms the above findings. IMPRESSION: Vascular: 1. No evidence of thoracoabdominal aortic aneurysm or dissection. 2. No evidence of pulmonary embolus. 3. Dilated main pulmonary artery consistent with pulmonary arterial hypertension. 4. Indwelling T IPS. Patency cannot be assessed due to phase of contrast enhancement. 5. Aortic Atherosclerosis (ICD10-I70.0). Coronary artery atherosclerosis. Nonvascular: 1. No acute intrathoracic, intra-abdominal, or intrapelvic process. 2. Cirrhosis, with stable exophytic mass off the inferior right lobe liver. Please see prior MRI 06/30/2023 for full description of findings as well as follow-up recommendations. Electronically Signed   By: Maxwell Caul.D.  On: 11/14/2023 13:05   DG Chest Port 1 View  Result Date: 11/14/2023 CLINICAL DATA:  Chest pain EXAM: PORTABLE CHEST 1 VIEW COMPARISON:  10/27/2023 FINDINGS: Generous heart size accentuated by technique. Normal aortic and hilar contours. There is no  edema, consolidation, effusion, or pneumothorax. Artifact from EKG leads. IMPRESSION: No active disease. Electronically Signed   By: Tiburcio Pea M.D.   On: 11/14/2023 05:43   DG Chest 2 View  Result Date: 10/27/2023 CLINICAL DATA:  Chest pain EXAM: CHEST - 2 VIEW COMPARISON:  10/16/2023 FINDINGS: Stable cardiomegaly and normal vascularity. Trachea midline. Both lungs are clear. The visualized skeletal structures are unremarkable. IMPRESSION: Cardiomegaly without acute process. Electronically Signed   By: Judie Petit.  Shick M.D.   On: 10/27/2023 07:12    Microbiology: No results found for this or any previous visit (from the past 240 hour(s)).   Labs: Basic Metabolic Panel: Recent Labs  Lab 11/22/23 2229 11/24/23 1036  NA 138 136  K 2.6* 3.5  CL 106 107  CO2 19* 19*  GLUCOSE 124* 105*  BUN <5* 7  CREATININE 0.69 0.50*  CALCIUM 8.9 8.3*  MG 1.9  --    Liver Function Tests: Recent Labs  Lab 11/23/23 0900  AST 85*  ALT 26  ALKPHOS 188*  BILITOT 3.2*  PROT 6.6  ALBUMIN 2.8*   No results for input(s): "LIPASE", "AMYLASE" in the last 168 hours. No results for input(s): "AMMONIA" in the last 168 hours. CBC: Recent Labs  Lab 11/22/23 2229  WBC 7.0  HGB 10.5*  HCT 31.9*  MCV 91.4  PLT 174   Cardiac Enzymes: No results for input(s): "CKTOTAL", "CKMB", "CKMBINDEX", "TROPONINI" in the last 168 hours. BNP: BNP (last 3 results) Recent Labs    06/30/23 1604 08/08/23 1433 09/03/23 0445  BNP 234.1* 217.7* 466.7*    ProBNP (last 3 results) No results for input(s): "PROBNP" in the last 8760 hours.  CBG: Recent Labs  Lab 11/23/23 2219 11/24/23 0812 11/24/23 1204  GLUCAP 109* 104* 112*       Signed:  Silvano Bilis MD.  [facility] 11/24/2023, 2:55 PM

## 2023-11-24 NOTE — Progress Notes (Signed)
Patient ambulated from wheelchair into taxi, alert with no distress noted. Discharged from hospital with taxi driving patient to his desired location [address], [place], [place]. Per patient this is his mother's address where he has been staying.

## 2023-11-24 NOTE — Progress Notes (Addendum)
Patient Name: Ethan Wells Date of Encounter: 11/24/2023 [facility] Cardiologist: Verne Carrow, MD   Interval Summary  .    Patient seen on AM rounds. Denies any shortness of breath but continues to have complaints of chest pain. States that when he gets shots of pain medication that has been the only thing to help his pain. He had to be woken from sleep for examination.   Vital Signs .    Vitals:   11/24/23 0500 11/24/23 0600 11/24/23 0812 11/24/23 1204  BP:   121/62 (!) 110/55  Pulse:   69 84  Resp: 18 17 16 18   Temp:   98.5 F (36.9 C) 97.8 F (36.6 C)  TempSrc:   Oral Oral  SpO2:   99% 99%  Weight:      Height:        Intake/Output Summary (Last 24 hours) at 11/24/2023 1424 Last data filed at 11/24/2023 0235 Gross per 24 hour  Intake 720 ml  Output 1000 ml  Net -280 ml      11/23/2023    1:09 AM 11/14/2023   10:39 AM 11/14/2023    4:48 AM  Last 3 Weights  Weight (lbs) 197 lb 15.6 oz 197 lb 15.6 oz 198 lb  Weight (kg) 89.8 kg 89.8 kg 89.812 kg      Telemetry/ECG    Sinus rhythm with multifocal PVCs with rates of 70 and 80- Personally Reviewed  Physical Exam .   GEN: No acute distress.   Neck: No JVD Cardiac: RRR, no murmurs, rubs, or gallops.  Respiratory: Clear to auscultation bilaterally. GI: Soft, nontender, non-distended  MS: No edema  Assessment & Plan .    Principal Problem:   Precordial chest pain Active Problems:   Tobacco abuse   Coronary artery disease involving native coronary artery of native heart with angina pectoris (HCC)   Demand ischemia (HCC)   Ischemic cardiomyopathy   Hypokalemia   Elevated troponin   Esophageal reflux   Cocaine dependence with cocaine-induced disorder (HCC)   Adrenal insufficiency (Addison's disease) (HCC)   Chronic diastolic CHF (congestive heart failure) (HCC)   Pancytopenia (HCC)   COPD (chronic obstructive pulmonary disease) (HCC)   Chest pain/Elevated Troponin (Demand  Ischemia)/history of coronary artery disease -Presented with severe chest pain in the setting of cocaine and alcohol use -Prior heart catheterization completed 10/2022 showed severe two-vessel CAD with CTO mid left circumflex and CTO of mid RCA, LAD stent patent, collaterals were noted, no intervention at that time -Prior echocardiogram in 03/2023 showed an LVEF of 55 to 60% -He was continued on aspirin and PTA rosuvastatin restarted -He was not started on nitrates given chronic hypotension and being positive for cocaine and beta-blockers were held -> if BP stabilizes, could consider low dose Imdur -Symptoms likely related to vasospasm from cocaine -High-sensitivity troponin trended 33, 48, 32, and 16 - Likely Demand Ischemia with known CAD -- NO plan for invasive evaluation at this time. (Agree with initial consult note) -EKG non acute -Continue on telemetry monitoring -EKG as needed for pain or changes  Chronic hypotension -Blood pressure 121/62 -Pressures have remained stable -He is continued on midodrine 5 mg 3 times daily -Vital signs per unit protocol  Polysubstance abuse (tobacco, marijuana, cocaine) -Urine drug screen positive for cocaine -EtOH level still +12 hours after admission -Total cessation is recommended  For questions or updates, please contact [facility] Please consult [email or web address] for contact info under  Signed, Signed, SHERI HAMMOCK, NP ...   ATTENDING ATTESTATION  I have seen, examined and evaluated the patient this afternoon along with SHERI HAMMOCK, NP .Marland KitchenMarland Kitchen  After reviewing all the available data and chart, we discussed the patients laboratory, study & physical findings as well as symptoms in detail.  I agree with her findings, examination as well as impression recommendations as per our discussion.    Attending adjustments noted in italics.   Not sure how much of his chest pain is to cardiac.  He is resting comfortably and complains of  pain.  Asking for pain medications. From a cardiac standpoint we can potentially add low-dose Imdur but otherwise I simply think that this is a pain management scenario in a patient with polysubstance abuse.  Low blood pressure limits her ability to treat with aggressive medical management from cardiac standpoint. I agree with I agree with no plans for further invasive evaluation.  From my cardiac standpoint with his troponin now being below 20 I think it would be stable for him to be discharged if thought to be safe from the primary service    Marykay Lex, MD, MS Bryan Lemma, M.D., M.S. Interventional Cardiologist  [facility]  Pager # [phone] Phone # [phone] [address] [place], [place] [address]  '

## 2023-11-24 NOTE — Care Management Obs Status (Signed)
MEDICARE OBSERVATION STATUS NOTIFICATION   Patient Details  Name: Ethan Wells MRN: [id number] Date of Birth: 1971/07/24   Medicare Observation Status Notification Given:  Yes    Colette Ribas, LCSWA 11/24/2023, 3:55 PM

## 2023-11-25 ENCOUNTER — Emergency Department
Admission: EM | Admit: 2023-11-25 | Discharge: 2023-11-25 | Discharge status: Against Medical Advice | Payer: Medicare HMO | Source: Home / Self Care

## 2023-11-25 LAB — HIV ANTIBODY (ROUTINE TESTING W REFLEX): HIV Screen 4th Generation wRfx: NONREACTIVE

## 2023-11-28 ENCOUNTER — Other Ambulatory Visit: Payer: Self-pay | Admitting: Adult Health

## 2023-11-28 DIAGNOSIS — G629 Polyneuropathy, unspecified: Secondary | ICD-10-CM

## 2023-11-28 NOTE — Telephone Encounter (Signed)
Patient has request medications not on list. Medications pend and sent to PCP Medina-Vargas, Margit Banda, NP

## 2023-12-01 NOTE — Telephone Encounter (Signed)
Medina-Vargas, Monina C, NP to Me Regarding result: Prescriptions    11/28/23  3:55 PM He needs to come for a in-person visit to better evaluate him. The last time he had a video visit was January [address]7564.

## 2023-12-03 ENCOUNTER — Encounter: Payer: Self-pay | Admitting: Gastroenterology

## 2023-12-11 ENCOUNTER — Other Ambulatory Visit: Payer: Self-pay

## 2023-12-11 ENCOUNTER — Emergency Department
Admission: EM | Admit: 2023-12-11 | Discharge: 2023-12-11 | Disposition: A | Discharge status: Home / Self Care | Payer: Medicare HMO | Source: Home / Self Care | Attending: Emergency Medicine | Admitting: Emergency Medicine

## 2023-12-11 ENCOUNTER — Emergency Department: Payer: Medicare HMO

## 2023-12-11 ENCOUNTER — Encounter: Payer: Self-pay | Admitting: *Deleted

## 2023-12-11 DIAGNOSIS — M48061 Spinal stenosis, lumbar region without neurogenic claudication: Secondary | ICD-10-CM | POA: Diagnosis not present

## 2023-12-11 DIAGNOSIS — E271 Primary adrenocortical insufficiency: Secondary | ICD-10-CM | POA: Diagnosis not present

## 2023-12-11 DIAGNOSIS — E872 Acidosis, unspecified: Secondary | ICD-10-CM | POA: Diagnosis not present

## 2023-12-11 DIAGNOSIS — R000 Tachycardia, unspecified: Secondary | ICD-10-CM | POA: Diagnosis not present

## 2023-12-11 DIAGNOSIS — F1090 Alcohol use, unspecified, uncomplicated: Secondary | ICD-10-CM | POA: Diagnosis not present

## 2023-12-11 DIAGNOSIS — R079 Chest pain, unspecified: Secondary | ICD-10-CM | POA: Insufficient documentation

## 2023-12-11 DIAGNOSIS — J449 Chronic obstructive pulmonary disease, unspecified: Secondary | ICD-10-CM | POA: Diagnosis present

## 2023-12-11 DIAGNOSIS — R531 Weakness: Secondary | ICD-10-CM | POA: Insufficient documentation

## 2023-12-11 DIAGNOSIS — I5032 Chronic diastolic (congestive) heart failure: Secondary | ICD-10-CM | POA: Diagnosis present

## 2023-12-11 DIAGNOSIS — Z79899 Other long term (current) drug therapy: Secondary | ICD-10-CM | POA: Diagnosis not present

## 2023-12-11 DIAGNOSIS — K219 Gastro-esophageal reflux disease without esophagitis: Secondary | ICD-10-CM | POA: Diagnosis present

## 2023-12-11 DIAGNOSIS — K7030 Alcoholic cirrhosis of liver without ascites: Secondary | ICD-10-CM | POA: Diagnosis not present

## 2023-12-11 DIAGNOSIS — I110 Hypertensive heart disease with heart failure: Secondary | ICD-10-CM | POA: Diagnosis not present

## 2023-12-11 DIAGNOSIS — I700 Atherosclerosis of aorta: Secondary | ICD-10-CM | POA: Diagnosis not present

## 2023-12-11 DIAGNOSIS — M5127 Other intervertebral disc displacement, lumbosacral region: Secondary | ICD-10-CM | POA: Diagnosis not present

## 2023-12-11 DIAGNOSIS — R42 Dizziness and giddiness: Secondary | ICD-10-CM | POA: Insufficient documentation

## 2023-12-11 DIAGNOSIS — Z5321 Procedure and treatment not carried out due to patient leaving prior to being seen by health care provider: Secondary | ICD-10-CM | POA: Insufficient documentation

## 2023-12-11 DIAGNOSIS — F17210 Nicotine dependence, cigarettes, uncomplicated: Secondary | ICD-10-CM | POA: Diagnosis present

## 2023-12-11 DIAGNOSIS — K625 Hemorrhage of anus and rectum: Secondary | ICD-10-CM | POA: Diagnosis not present

## 2023-12-11 DIAGNOSIS — Z720 Tobacco use: Secondary | ICD-10-CM | POA: Diagnosis not present

## 2023-12-11 DIAGNOSIS — D509 Iron deficiency anemia, unspecified: Secondary | ICD-10-CM | POA: Diagnosis present

## 2023-12-11 DIAGNOSIS — F1020 Alcohol dependence, uncomplicated: Secondary | ICD-10-CM | POA: Diagnosis not present

## 2023-12-11 DIAGNOSIS — M5136 Other intervertebral disc degeneration, lumbar region with discogenic back pain only: Secondary | ICD-10-CM | POA: Diagnosis not present

## 2023-12-11 DIAGNOSIS — I214 Non-ST elevation (NSTEMI) myocardial infarction: Secondary | ICD-10-CM | POA: Diagnosis not present

## 2023-12-11 DIAGNOSIS — M79605 Pain in left leg: Secondary | ICD-10-CM | POA: Diagnosis not present

## 2023-12-11 DIAGNOSIS — D62 Acute posthemorrhagic anemia: Secondary | ICD-10-CM | POA: Diagnosis not present

## 2023-12-11 DIAGNOSIS — M5126 Other intervertebral disc displacement, lumbar region: Secondary | ICD-10-CM | POA: Diagnosis not present

## 2023-12-11 DIAGNOSIS — F1429 Cocaine dependence with unspecified cocaine-induced disorder: Secondary | ICD-10-CM | POA: Diagnosis not present

## 2023-12-11 DIAGNOSIS — Z6831 Body mass index (BMI) 31.0-31.9, adult: Secondary | ICD-10-CM | POA: Diagnosis not present

## 2023-12-11 DIAGNOSIS — R29898 Other symptoms and signs involving the musculoskeletal system: Secondary | ICD-10-CM

## 2023-12-11 DIAGNOSIS — I2721 Secondary pulmonary arterial hypertension: Secondary | ICD-10-CM | POA: Diagnosis not present

## 2023-12-11 DIAGNOSIS — E66811 Obesity, class 1: Secondary | ICD-10-CM | POA: Diagnosis not present

## 2023-12-11 DIAGNOSIS — E6609 Other obesity due to excess calories: Secondary | ICD-10-CM | POA: Diagnosis not present

## 2023-12-11 DIAGNOSIS — M5442 Lumbago with sciatica, left side: Secondary | ICD-10-CM | POA: Insufficient documentation

## 2023-12-11 DIAGNOSIS — I10 Essential (primary) hypertension: Secondary | ICD-10-CM | POA: Diagnosis not present

## 2023-12-11 DIAGNOSIS — G8929 Other chronic pain: Secondary | ICD-10-CM | POA: Diagnosis present

## 2023-12-11 DIAGNOSIS — F1910 Other psychoactive substance abuse, uncomplicated: Secondary | ICD-10-CM | POA: Diagnosis not present

## 2023-12-11 DIAGNOSIS — M5416 Radiculopathy, lumbar region: Secondary | ICD-10-CM

## 2023-12-11 DIAGNOSIS — I8511 Secondary esophageal varices with bleeding: Secondary | ICD-10-CM | POA: Diagnosis not present

## 2023-12-11 DIAGNOSIS — M6281 Muscle weakness (generalized): Secondary | ICD-10-CM | POA: Diagnosis not present

## 2023-12-11 DIAGNOSIS — K551 Chronic vascular disorders of intestine: Secondary | ICD-10-CM | POA: Diagnosis not present

## 2023-12-11 DIAGNOSIS — K766 Portal hypertension: Secondary | ICD-10-CM | POA: Diagnosis present

## 2023-12-11 DIAGNOSIS — I255 Ischemic cardiomyopathy: Secondary | ICD-10-CM | POA: Diagnosis present

## 2023-12-11 DIAGNOSIS — M5432 Sciatica, left side: Secondary | ICD-10-CM

## 2023-12-11 DIAGNOSIS — I2510 Atherosclerotic heart disease of native coronary artery without angina pectoris: Secondary | ICD-10-CM | POA: Diagnosis not present

## 2023-12-11 DIAGNOSIS — I517 Cardiomegaly: Secondary | ICD-10-CM | POA: Diagnosis not present

## 2023-12-11 DIAGNOSIS — R7989 Other specified abnormal findings of blood chemistry: Secondary | ICD-10-CM | POA: Diagnosis not present

## 2023-12-11 DIAGNOSIS — E876 Hypokalemia: Secondary | ICD-10-CM | POA: Diagnosis not present

## 2023-12-11 DIAGNOSIS — E785 Hyperlipidemia, unspecified: Secondary | ICD-10-CM | POA: Diagnosis not present

## 2023-12-11 LAB — BASIC METABOLIC PANEL
Anion gap: 10 (ref 5–15)
Anion gap: 15 (ref 5–15)
BUN: 7 mg/dL (ref 6–20)
BUN: 6 mg/dL (ref 6–20)
CO2: 21 mmol/L — ABNORMAL LOW (ref 22–32)
CO2: 16 mmol/L — ABNORMAL LOW (ref 22–32)
Calcium: 8.6 mg/dL — ABNORMAL LOW (ref 8.9–10.3)
Calcium: 8.8 mg/dL — ABNORMAL LOW (ref 8.9–10.3)
Chloride: 104 mmol/L (ref 98–111)
Chloride: 106 mmol/L (ref 98–111)
Creatinine, Ser: 0.6 mg/dL — ABNORMAL LOW (ref 0.61–1.24)
Creatinine, Ser: 0.59 mg/dL — ABNORMAL LOW (ref 0.61–1.24)
GFR, Estimated: >60 mL/min (ref >60)
GFR, Estimated: >60 mL/min (ref >60)
Glucose, Bld: 118 mg/dL — ABNORMAL HIGH (ref 70–99)
Glucose, Bld: 146 mg/dL — ABNORMAL HIGH (ref 70–99)
Potassium: 3.7 mmol/L (ref 3.5–5.1)
Potassium: 3.4 mmol/L — ABNORMAL LOW (ref 3.5–5.1)
Sodium: 135 mmol/L (ref 135–145)
Sodium: 137 mmol/L (ref 135–145)

## 2023-12-11 LAB — CBC
HCT: 30.8 % — ABNORMAL LOW (ref 39.0–52.0)
HCT: 32.8 % — ABNORMAL LOW (ref 39.0–52.0)
Hemoglobin: 10.1 g/dL — ABNORMAL LOW (ref 13.0–17.0)
Hemoglobin: 10.5 g/dL — ABNORMAL LOW (ref 13.0–17.0)
MCH: 31.4 pg (ref 26.0–34.0)
MCH: 31.1 pg (ref 26.0–34.0)
MCHC: 32.8 g/dL (ref 30.0–36.0)
MCHC: 32 g/dL (ref 30.0–36.0)
MCV: 95.7 fL (ref 80.0–100.0)
MCV: 97 fL (ref 80.0–100.0)
Platelets: 131 10*3/uL — ABNORMAL LOW (ref 150–400)
Platelets: 114 10*3/uL — ABNORMAL LOW (ref 150–400)
RBC: 3.22 MIL/uL — ABNORMAL LOW (ref 4.22–5.81)
RBC: 3.38 MIL/uL — ABNORMAL LOW (ref 4.22–5.81)
RDW: 17.2 % — ABNORMAL HIGH (ref 11.5–15.5)
RDW: 16.6 % — ABNORMAL HIGH (ref 11.5–15.5)
WBC: 5.7 10*3/uL (ref 4.0–10.5)
WBC: 6.2 10*3/uL (ref 4.0–10.5)
nRBC: 0 % (ref 0.0–0.2)
nRBC: 0 % (ref 0.0–0.2)

## 2023-12-11 LAB — URINALYSIS, ROUTINE W REFLEX MICROSCOPIC
Bilirubin Urine: NEGATIVE
Glucose, UA: NEGATIVE mg/dL
Hgb urine dipstick: NEGATIVE
Ketones, ur: NEGATIVE mg/dL
Leukocytes,Ua: NEGATIVE
Nitrite: NEGATIVE
Protein, ur: NEGATIVE mg/dL
Specific Gravity, Urine: 1.012 (ref 1.005–1.030)
pH: 5 (ref 5.0–8.0)

## 2023-12-11 LAB — ETHANOL: Alcohol, Ethyl (B): 252 mg/dL — ABNORMAL HIGH (ref <10)

## 2023-12-11 LAB — TROPONIN I (HIGH SENSITIVITY): Troponin I (High Sensitivity): 14 ng/L (ref <18)

## 2023-12-11 MED ORDER — METHOCARBAMOL 500 MG PO TABS: 500.0000 mg | ORAL_TABLET | Freq: Four times a day (QID) | ORAL | 0 refills | Status: AC

## 2023-12-11 MED ORDER — METHOCARBAMOL 500 MG PO TABS
500.0000 mg | ORAL_TABLET | Freq: Once | ORAL | Status: AC
  Administered 2023-12-11: 500 mg via ORAL
  Filled 2023-12-11: qty 1

## 2023-12-11 MED ORDER — MELOXICAM 15 MG PO TABS: 15.0000 mg | ORAL_TABLET | Freq: Every day | ORAL | 0 refills | Status: AC

## 2023-12-11 MED ORDER — PREDNISONE 10 MG (21) PO TBPK: ORAL_TABLET | ORAL | 0 refills | Status: DC

## 2023-12-11 MED ORDER — PREDNISONE 20 MG PO TABS
60.0000 mg | ORAL_TABLET | Freq: Once | ORAL | Status: AC
  Administered 2023-12-11: 60 mg via ORAL
  Filled 2023-12-11: qty 3

## 2023-12-11 MED ORDER — HYDROCODONE-ACETAMINOPHEN 5-325 MG PO TABS
1.0000 | ORAL_TABLET | Freq: Once | ORAL | Status: AC
  Administered 2023-12-11: 1 via ORAL
  Filled 2023-12-11: qty 1

## 2023-12-11 MED ORDER — GABAPENTIN 100 MG PO CAPS
100.0000 mg | ORAL_CAPSULE | Freq: Once | ORAL | Status: AC
  Administered 2023-12-11: 100 mg via ORAL
  Filled 2023-12-11: qty 1

## 2023-12-11 MED ORDER — NAPROXEN 500 MG PO TABS
500.0000 mg | ORAL_TABLET | Freq: Once | ORAL | Status: AC
  Administered 2023-12-11: 500 mg via ORAL
  Filled 2023-12-11: qty 1

## 2023-12-11 NOTE — ED Provider Notes (Signed)
[facility] Provider Note    None    (approximate)   History   Weakness   HPI  Ethan Wells is a 52 y.o. male who presents to the ED for evaluation of Weakness   I review a medical DC summary from 3 weeks ago.  Cirrhosis s/p TIPS procedure, SMA stenosis s/p stenting.  Polysubstance abuse, adrenal insufficiency  Patient presents for elevation of bilateral leg weakness.  Reports he typically ambulates around his apartment by himself, with a cane or a rollator.  He was standing in front of his stove, starting to cook a meal when he felt sudden generalized weakness.  He was able to sit himself down in a chair but denies any significant trauma.  Increased spasms to his lower back, paresthesias and weakness to his left thigh.   Physical Exam   Triage Vital Signs: ED Triage Vitals  Encounter Vitals Group     BP 12/11/23 0125 117/66     Systolic BP Percentile --      Diastolic BP Percentile --      Pulse Rate 12/11/23 0125 97     Resp 12/11/23 0125 17     Temp 12/11/23 0125 97.9 F (36.6 C)     Temp Source 12/11/23 0125 Oral     SpO2 12/11/23 0125 100 %     Weight 12/11/23 0121 200 lb (90.7 kg)     Height 12/11/23 0121 5\' 5"  (1.651 m)     Head Circumference --      Peak Flow --      Pain Score 12/11/23 0121 8     Pain Loc --      Pain Education --      Exclude from Growth Chart --     Most recent vital signs: Vitals:   12/11/23 0125 12/11/23 0642  BP: 117/66 117/69  Pulse: 97 89  Resp: 17 16  Temp: 97.9 F (36.6 C) 97.6 F (36.4 C)  SpO2: 100% 99%    General: Awake, no distress.  CV:  Good peripheral perfusion.  Resp:  Normal effort.  Abd:  No distention.  MSK:  No deformity noted.  Neuro:  No focal deficits appreciated. Other:  Decree sensation to the anterior left thigh, otherwise sensation intact to the bilateral lower extremities.  Able to wiggle his toes bilaterally.  Refuses to attempt to flex the hips to elevate his  legs.  No rigidity, clonus or seizure activity.  Suspect some of this is effort based but difficult to tell.  No clear laterality.   Strength and sensation intact bilateral upper extremities.   ED Results / Procedures / Treatments   Labs (all labs ordered are listed, but only abnormal results are displayed) Labs Reviewed  BASIC METABOLIC PANEL - Abnormal; Notable for the following components:      Result Value   CO2 21 (*)    Glucose, Bld 118 (*)    Creatinine, Ser 0.60 (*)    Calcium 8.6 (*)    All other components within normal limits  CBC - Abnormal; Notable for the following components:   RBC 3.22 (*)    Hemoglobin 10.1 (*)    HCT 30.8 (*)    RDW 17.2 (*)    Platelets 131 (*)    All other components within normal limits  URINALYSIS, ROUTINE W REFLEX MICROSCOPIC - Abnormal; Notable for the following components:   Color, Urine YELLOW (*)    APPearance HAZY (*)  All other components within normal limits    EKG   RADIOLOGY   Official radiology report(s): No results found.  PROCEDURES and INTERVENTIONS:  Procedures  Medications  methocarbamol (ROBAXIN) tablet 500 mg (500 mg Oral Given 12/11/23 9147)  gabapentin (NEURONTIN) capsule 100 mg (100 mg Oral Given 12/11/23 8295)  naproxen (NAPROSYN) tablet 500 mg (500 mg Oral Given 12/11/23 6213)     IMPRESSION / MDM / ASSESSMENT AND PLAN / ED COURSE  I reviewed the triage vital signs and the nursing notes.  Differential diagnosis includes, but is not limited to, sciatica, secondary gain, cauda equina, muscular spasm  {Patient presents with symptoms of an acute illness or injury that is potentially life-threatening.  Patient with history of sciatica muscular spasm presents with increased spasms and paresthesias to his left thigh, consistent with sciatica, but with complaints of increased bilateral atraumatic lower extremity weakness requiring MRI to rule out cauda equina.  Screening blood work is benign with normal  electrolytes, UA.  No leukocytosis, fevers.  Signed out to oncoming physician.      FINAL CLINICAL IMPRESSION(S) / ED DIAGNOSES   Final diagnoses:  Bilateral leg weakness  Sciatica of left side     Rx / DC Orders   ED Discharge Orders     None        Note:  This document was prepared using Dragon voice recognition software and may include unintentional dictation errors.   Delton Prairie, MD 12/11/23 [phone]

## 2023-12-11 NOTE — ED Notes (Signed)
Pt complaining about wait times, pt called for cab to take him home. Pt refusing labs and EKG at this time. Pt placed closer to the door to watch for cab.

## 2023-12-11 NOTE — ED Provider Notes (Signed)
Emergency department handoff note  Care of this patient was signed out to me at the end of the previous provider shift.  All pertinent patient information was conveyed and all questions were answered.  Patient pending MRI of the lumbar spine showing likely lumbar radiculitis.  Patient is ambulatory to the bathroom with minimal assistance.  Patient states that he has a walker and a cane at home but "tries not to use them".  Patient was encouraged to use his walker for at least the next 3-5 days due to his likely acute on chronic lumbar pathology.  Patient continues to show no red flag symptomatology for spinal compression including no saddle anesthesia, bowel/bladder incontinence, or lower extremity numbness.  The patient has been reexamined and is ready to be discharged.  All diagnostic results have been reviewed and discussed with the patient/family.  Care plan has been outlined and the patient/family understands all current diagnoses, results, and treatment plans.  There are no new complaints, changes, or physical findings at this time.  All questions have been addressed and answered.  All medications, if any, that were given while in the emergency department or any that are being prescribed have been reviewed with the patient/family.  All side effects and adverse reactions have been explained.  Patient was instructed to, and agrees to follow-up with their primary care physician as well as return to the emergency department if any new or worsening symptoms develop.   Merwyn Katos, MD 12/11/23 [phone]

## 2023-12-11 NOTE — ED Triage Notes (Signed)
FIRST NURSE NOTE:  Pt arrived via [facility] from home, reports making a sandwich and could not stand anymore, pt has hx of neuropathy, takes gabapentin, pt c/o pain going down L leg.  Pt was not ambulating with EMS.   P100  97% BP: 118/71

## 2023-12-11 NOTE — ED Notes (Signed)
Now pt states he will stay to be evaluated and is calling the cab company to cancel his ride.

## 2023-12-11 NOTE — ED Triage Notes (Signed)
Pt brought in via ems from home.  Pt reports he has chest pain and was discharged today from [facility].  Pt has pain in center of chest.   No n/v/   pt reports dizziness.   Pt alert speech clear.

## 2023-12-11 NOTE — ED Triage Notes (Signed)
Pt reports that he cannot move his BIL legs x3 hours. Pt states that he has had muscle cramps for "a while" because he hasn't been able to take his robaxin or gabapentin. No loss of bowel or bladder. No injury reported.

## 2023-12-12 ENCOUNTER — Encounter: Payer: Self-pay | Admitting: Internal Medicine

## 2023-12-12 ENCOUNTER — Inpatient Hospital Stay
Admission: EM | Admit: 2023-12-12 | Discharge: 2023-12-18 | DRG: 280 | Disposition: A | Discharge status: Home / Self Care | Payer: Medicare HMO | Attending: Internal Medicine | Admitting: Internal Medicine

## 2023-12-12 ENCOUNTER — Other Ambulatory Visit: Payer: Self-pay

## 2023-12-12 ENCOUNTER — Emergency Department: Payer: Medicare HMO

## 2023-12-12 ENCOUNTER — Emergency Department
Admission: EM | Admit: 2023-12-12 | Discharge: 2023-12-12 | Discharge status: Against Medical Advice | Payer: Medicare HMO | Source: Home / Self Care

## 2023-12-12 DIAGNOSIS — K219 Gastro-esophageal reflux disease without esophagitis: Secondary | ICD-10-CM | POA: Diagnosis present

## 2023-12-12 DIAGNOSIS — Z716 Tobacco abuse counseling: Secondary | ICD-10-CM

## 2023-12-12 DIAGNOSIS — K551 Chronic vascular disorders of intestine: Secondary | ICD-10-CM | POA: Diagnosis present

## 2023-12-12 DIAGNOSIS — E785 Hyperlipidemia, unspecified: Secondary | ICD-10-CM | POA: Diagnosis not present

## 2023-12-12 DIAGNOSIS — I5032 Chronic diastolic (congestive) heart failure: Secondary | ICD-10-CM | POA: Diagnosis present

## 2023-12-12 DIAGNOSIS — E6609 Other obesity due to excess calories: Secondary | ICD-10-CM | POA: Diagnosis not present

## 2023-12-12 DIAGNOSIS — E66811 Obesity, class 1: Secondary | ICD-10-CM | POA: Diagnosis not present

## 2023-12-12 DIAGNOSIS — I2721 Secondary pulmonary arterial hypertension: Secondary | ICD-10-CM | POA: Diagnosis present

## 2023-12-12 DIAGNOSIS — J449 Chronic obstructive pulmonary disease, unspecified: Secondary | ICD-10-CM | POA: Diagnosis present

## 2023-12-12 DIAGNOSIS — E2740 Unspecified adrenocortical insufficiency: Secondary | ICD-10-CM | POA: Diagnosis present

## 2023-12-12 DIAGNOSIS — E876 Hypokalemia: Secondary | ICD-10-CM | POA: Diagnosis present

## 2023-12-12 DIAGNOSIS — Z791 Long term (current) use of non-steroidal anti-inflammatories (NSAID): Secondary | ICD-10-CM

## 2023-12-12 DIAGNOSIS — Z6831 Body mass index (BMI) 31.0-31.9, adult: Secondary | ICD-10-CM

## 2023-12-12 DIAGNOSIS — I110 Hypertensive heart disease with heart failure: Secondary | ICD-10-CM | POA: Diagnosis present

## 2023-12-12 DIAGNOSIS — K766 Portal hypertension: Secondary | ICD-10-CM | POA: Diagnosis present

## 2023-12-12 DIAGNOSIS — R7989 Other specified abnormal findings of blood chemistry: Secondary | ICD-10-CM | POA: Diagnosis present

## 2023-12-12 DIAGNOSIS — I10 Essential (primary) hypertension: Secondary | ICD-10-CM | POA: Insufficient documentation

## 2023-12-12 DIAGNOSIS — E272 Addisonian crisis: Secondary | ICD-10-CM | POA: Diagnosis present

## 2023-12-12 DIAGNOSIS — I255 Ischemic cardiomyopathy: Secondary | ICD-10-CM | POA: Diagnosis present

## 2023-12-12 DIAGNOSIS — G8929 Other chronic pain: Secondary | ICD-10-CM | POA: Diagnosis present

## 2023-12-12 DIAGNOSIS — D62 Acute posthemorrhagic anemia: Secondary | ICD-10-CM | POA: Diagnosis present

## 2023-12-12 DIAGNOSIS — Z955 Presence of coronary angioplasty implant and graft: Secondary | ICD-10-CM

## 2023-12-12 DIAGNOSIS — D509 Iron deficiency anemia, unspecified: Secondary | ICD-10-CM | POA: Diagnosis present

## 2023-12-12 DIAGNOSIS — F1429 Cocaine dependence with unspecified cocaine-induced disorder: Secondary | ICD-10-CM | POA: Diagnosis present

## 2023-12-12 DIAGNOSIS — F1020 Alcohol dependence, uncomplicated: Secondary | ICD-10-CM | POA: Diagnosis present

## 2023-12-12 DIAGNOSIS — K7030 Alcoholic cirrhosis of liver without ascites: Secondary | ICD-10-CM | POA: Diagnosis present

## 2023-12-12 DIAGNOSIS — F17210 Nicotine dependence, cigarettes, uncomplicated: Secondary | ICD-10-CM | POA: Diagnosis present

## 2023-12-12 DIAGNOSIS — Z720 Tobacco use: Secondary | ICD-10-CM | POA: Diagnosis not present

## 2023-12-12 DIAGNOSIS — E872 Acidosis, unspecified: Secondary | ICD-10-CM | POA: Diagnosis present

## 2023-12-12 DIAGNOSIS — I8511 Secondary esophageal varices with bleeding: Secondary | ICD-10-CM | POA: Diagnosis present

## 2023-12-12 DIAGNOSIS — I9589 Other hypotension: Secondary | ICD-10-CM | POA: Diagnosis present

## 2023-12-12 DIAGNOSIS — I214 Non-ST elevation (NSTEMI) myocardial infarction: Secondary | ICD-10-CM | POA: Diagnosis not present

## 2023-12-12 DIAGNOSIS — F1210 Cannabis abuse, uncomplicated: Secondary | ICD-10-CM | POA: Diagnosis present

## 2023-12-12 DIAGNOSIS — Z880 Allergy status to penicillin: Secondary | ICD-10-CM

## 2023-12-12 DIAGNOSIS — K625 Hemorrhage of anus and rectum: Secondary | ICD-10-CM | POA: Diagnosis not present

## 2023-12-12 DIAGNOSIS — E271 Primary adrenocortical insufficiency: Secondary | ICD-10-CM | POA: Diagnosis present

## 2023-12-12 DIAGNOSIS — Z8674 Personal history of sudden cardiac arrest: Secondary | ICD-10-CM

## 2023-12-12 DIAGNOSIS — Z811 Family history of alcohol abuse and dependence: Secondary | ICD-10-CM

## 2023-12-12 DIAGNOSIS — I700 Atherosclerosis of aorta: Secondary | ICD-10-CM | POA: Diagnosis not present

## 2023-12-12 DIAGNOSIS — G43909 Migraine, unspecified, not intractable, without status migrainosus: Secondary | ICD-10-CM | POA: Diagnosis present

## 2023-12-12 DIAGNOSIS — K2270 Barrett's esophagus without dysplasia: Secondary | ICD-10-CM | POA: Diagnosis present

## 2023-12-12 DIAGNOSIS — Z8249 Family history of ischemic heart disease and other diseases of the circulatory system: Secondary | ICD-10-CM

## 2023-12-12 DIAGNOSIS — I2510 Atherosclerotic heart disease of native coronary artery without angina pectoris: Secondary | ICD-10-CM | POA: Diagnosis present

## 2023-12-12 DIAGNOSIS — F418 Other specified anxiety disorders: Secondary | ICD-10-CM | POA: Diagnosis present

## 2023-12-12 DIAGNOSIS — K649 Unspecified hemorrhoids: Secondary | ICD-10-CM | POA: Diagnosis present

## 2023-12-12 DIAGNOSIS — I252 Old myocardial infarction: Secondary | ICD-10-CM

## 2023-12-12 DIAGNOSIS — F1090 Alcohol use, unspecified, uncomplicated: Secondary | ICD-10-CM | POA: Diagnosis not present

## 2023-12-12 DIAGNOSIS — F1910 Other psychoactive substance abuse, uncomplicated: Secondary | ICD-10-CM | POA: Diagnosis present

## 2023-12-12 DIAGNOSIS — I517 Cardiomegaly: Secondary | ICD-10-CM | POA: Diagnosis not present

## 2023-12-12 DIAGNOSIS — R000 Tachycardia, unspecified: Secondary | ICD-10-CM | POA: Diagnosis not present

## 2023-12-12 DIAGNOSIS — Z79891 Long term (current) use of opiate analgesic: Secondary | ICD-10-CM

## 2023-12-12 DIAGNOSIS — E669 Obesity, unspecified: Secondary | ICD-10-CM | POA: Diagnosis present

## 2023-12-12 DIAGNOSIS — Z79899 Other long term (current) drug therapy: Secondary | ICD-10-CM | POA: Diagnosis not present

## 2023-12-12 DIAGNOSIS — R079 Chest pain, unspecified: Secondary | ICD-10-CM | POA: Diagnosis not present

## 2023-12-12 LAB — TROPONIN I (HIGH SENSITIVITY)
Troponin I (High Sensitivity): 256 ng/L (ref <18)
Troponin I (High Sensitivity): 270 ng/L (ref <18)
Troponin I (High Sensitivity): 313 ng/L (ref <18)
Troponin I (High Sensitivity): 287 ng/L (ref <18)

## 2023-12-12 LAB — COMPREHENSIVE METABOLIC PANEL
ALT: 16 U/L (ref 0–44)
AST: 56 U/L — ABNORMAL HIGH (ref 15–41)
Albumin: 3 g/dL — ABNORMAL LOW (ref 3.5–5.0)
Alkaline Phosphatase: 165 U/L — ABNORMAL HIGH (ref 38–126)
Anion gap: 12 (ref 5–15)
BUN: 8 mg/dL (ref 6–20)
CO2: 20 mmol/L — ABNORMAL LOW (ref 22–32)
Calcium: 8.7 mg/dL — ABNORMAL LOW (ref 8.9–10.3)
Chloride: 105 mmol/L (ref 98–111)
Creatinine, Ser: 0.62 mg/dL (ref 0.61–1.24)
GFR, Estimated: >60 mL/min (ref >60)
Glucose, Bld: 131 mg/dL — ABNORMAL HIGH (ref 70–99)
Potassium: 3.4 mmol/L — ABNORMAL LOW (ref 3.5–5.1)
Sodium: 137 mmol/L (ref 135–145)
Total Bilirubin: 3 mg/dL — ABNORMAL HIGH (ref <1.2)
Total Protein: 7 g/dL (ref 6.5–8.1)

## 2023-12-12 LAB — MAGNESIUM: Magnesium: 1.5 mg/dL — ABNORMAL LOW (ref 1.7–2.4)

## 2023-12-12 LAB — RESP PANEL BY RT-PCR (RSV, FLU A&B, COVID)  RVPGX2
Influenza A by PCR: NEGATIVE
Influenza B by PCR: NEGATIVE
Resp Syncytial Virus by PCR: NEGATIVE
SARS Coronavirus 2 by RT PCR: NEGATIVE

## 2023-12-12 LAB — CBC
HCT: 29.2 % — ABNORMAL LOW (ref 39.0–52.0)
Hemoglobin: 9.7 g/dL — ABNORMAL LOW (ref 13.0–17.0)
MCH: 31.5 pg (ref 26.0–34.0)
MCHC: 33.2 g/dL (ref 30.0–36.0)
MCV: 94.8 fL (ref 80.0–100.0)
Platelets: 97 10*3/uL — ABNORMAL LOW (ref 150–400)
RBC: 3.08 MIL/uL — ABNORMAL LOW (ref 4.22–5.81)
RDW: 17 % — ABNORMAL HIGH (ref 11.5–15.5)
WBC: 4.5 10*3/uL (ref 4.0–10.5)
nRBC: 0 % (ref 0.0–0.2)

## 2023-12-12 LAB — APTT: aPTT: 37 s — ABNORMAL HIGH (ref 24–36)

## 2023-12-12 LAB — URINE DRUG SCREEN, QUALITATIVE (ARMC ONLY)
Amphetamines, Ur Screen: NOT DETECTED
Barbiturates, Ur Screen: NOT DETECTED
Benzodiazepine, Ur Scrn: NOT DETECTED
Cannabinoid 50 Ng, Ur ~~LOC~~: NOT DETECTED
Cocaine Metabolite,Ur ~~LOC~~: POSITIVE — AB
MDMA (Ecstasy)Ur Screen: NOT DETECTED
Methadone Scn, Ur: NOT DETECTED
Opiate, Ur Screen: NOT DETECTED
Phencyclidine (PCP) Ur S: NOT DETECTED
Tricyclic, Ur Screen: NOT DETECTED

## 2023-12-12 LAB — PROTIME-INR
INR: 1.5 — ABNORMAL HIGH (ref 0.8–1.2)
Prothrombin Time: 18.4 s — ABNORMAL HIGH (ref 11.4–15.2)

## 2023-12-12 LAB — HEPARIN LEVEL (UNFRACTIONATED): Heparin Unfractionated: 0.17 [IU]/mL — ABNORMAL LOW (ref 0.30–0.70)

## 2023-12-12 MED ORDER — ONDANSETRON HCL 4 MG/2ML IJ SOLN
4.0000 mg | Freq: Once | INTRAMUSCULAR | Status: AC
  Administered 2023-12-12: 4 mg via INTRAVENOUS
  Filled 2023-12-12: qty 2

## 2023-12-12 MED ORDER — KETOROLAC TROMETHAMINE 15 MG/ML IJ SOLN
15.0000 mg | Freq: Once | INTRAMUSCULAR | Status: AC
  Administered 2023-12-12: 15 mg via INTRAVENOUS
  Filled 2023-12-12: qty 1

## 2023-12-12 MED ORDER — IPRATROPIUM-ALBUTEROL 0.5-2.5 (3) MG/3ML IN SOLN: 3.0000 mL | Freq: Four times a day (QID) | RESPIRATORY_TRACT | Status: AC | PRN

## 2023-12-12 MED ORDER — ONDANSETRON HCL 4 MG/2ML IJ SOLN: 4.0000 mg | Freq: Four times a day (QID) | INTRAMUSCULAR | Status: AC | PRN

## 2023-12-12 MED ORDER — ACETAMINOPHEN 650 MG RE SUPP: 650.0000 mg | Freq: Four times a day (QID) | RECTAL | Status: DC | PRN

## 2023-12-12 MED ORDER — MORPHINE SULFATE (PF) 4 MG/ML IV SOLN
4.0000 mg | INTRAVENOUS | Status: AC | PRN
  Administered 2023-12-12 – 2023-12-13 (×5): 4 mg via INTRAVENOUS
  Filled 2023-12-12 (×5): qty 1

## 2023-12-12 MED ORDER — SODIUM CHLORIDE 0.9 % IV BOLUS
500.0000 mL | Freq: Once | INTRAVENOUS | Status: AC
  Administered 2023-12-12: 500 mL via INTRAVENOUS

## 2023-12-12 MED ORDER — LORAZEPAM 2 MG/ML IJ SOLN
1.0000 mg | INTRAMUSCULAR | Status: DC | PRN
  Administered 2023-12-16: 1 mg via INTRAVENOUS
  Filled 2023-12-12: qty 1

## 2023-12-12 MED ORDER — HYDRALAZINE HCL 20 MG/ML IJ SOLN: 5.0000 mg | Freq: Four times a day (QID) | INTRAMUSCULAR | Status: AC | PRN

## 2023-12-12 MED ORDER — IOHEXOL 350 MG/ML SOLN
75.0000 mL | Freq: Once | INTRAVENOUS | Status: AC | PRN
  Administered 2023-12-12: 75 mL via INTRAVENOUS

## 2023-12-12 MED ORDER — MORPHINE SULFATE (PF) 2 MG/ML IV SOLN: 2.0000 mg | INTRAVENOUS | Status: DC | PRN

## 2023-12-12 MED ORDER — PANTOPRAZOLE SODIUM 40 MG IV SOLR
40.0000 mg | Freq: Two times a day (BID) | INTRAVENOUS | Status: DC
  Administered 2023-12-12 – 2023-12-18 (×13): 40 mg via INTRAVENOUS
  Filled 2023-12-12 (×13): qty 10

## 2023-12-12 MED ORDER — HEPARIN (PORCINE) 25000 UT/250ML-% IV SOLN
1450.0000 [IU]/h | INTRAVENOUS | Status: DC
  Administered 2023-12-12: 1000 [IU]/h via INTRAVENOUS
  Administered 2023-12-13: 1300 [IU]/h via INTRAVENOUS
  Administered 2023-12-14: 1450 [IU]/h via INTRAVENOUS
  Filled 2023-12-12 (×3): qty 250

## 2023-12-12 MED ORDER — NICOTINE 21 MG/24HR TD PT24: 21.0000 mg | MEDICATED_PATCH | Freq: Every day | TRANSDERMAL | Status: AC | PRN

## 2023-12-12 MED ORDER — ACETAMINOPHEN 325 MG PO TABS
650.0000 mg | ORAL_TABLET | Freq: Four times a day (QID) | ORAL | Status: DC | PRN
Start: 2023-12-12 — End: 2023-12-15
  Administered 2023-12-13: 650 mg via ORAL
  Filled 2023-12-12: qty 2

## 2023-12-12 MED ORDER — NITROGLYCERIN 2 % TD OINT
1.0000 [in_us] | TOPICAL_OINTMENT | Freq: Four times a day (QID) | TRANSDERMAL | Status: AC | PRN
  Administered 2023-12-13: 1 [in_us] via TOPICAL
  Filled 2023-12-12: qty 1

## 2023-12-12 MED ORDER — ONDANSETRON HCL 4 MG PO TABS: 4.0000 mg | ORAL_TABLET | Freq: Four times a day (QID) | ORAL | Status: AC | PRN

## 2023-12-12 MED ORDER — MORPHINE SULFATE (PF) 4 MG/ML IV SOLN
4.0000 mg | Freq: Once | INTRAVENOUS | Status: AC
  Administered 2023-12-12: 4 mg via INTRAVENOUS
  Filled 2023-12-12: qty 1

## 2023-12-12 MED ORDER — MORPHINE SULFATE (PF) 2 MG/ML IV SOLN: 2.0000 mg | INTRAVENOUS | Status: AC | PRN

## 2023-12-12 NOTE — ED Notes (Signed)
No answer when called several times from lobby 

## 2023-12-12 NOTE — Progress Notes (Signed)
PHARMACY - ANTICOAGULATION CONSULT NOTE  Pharmacy Consult for Heparin Infusion Indication: chest pain/ACS  Allergies  Allergen Reactions   Penicillins Hives and Rash    Tolerated Ceftriaxone 10/2022    Patient Measurements: Height: 5\' 5"  (165.1 cm) Weight: 86.6 kg (191 lb) IBW/kg (Calculated) : 61.5 Heparin Dosing Weight: 79.8 kg  Vital Signs: Temp: 98.1 F (36.7 C) (12/27 1249) Temp Source: Oral (12/27 1249) BP: 97/61 (12/27 2030) Pulse Rate: 72 (12/27 2030)  Labs: Recent Labs    12/11/23 0427 12/11/23 0427 12/11/23 2228 12/12/23 0900 12/12/23 1035 12/12/23 1254 12/12/23 1257 12/12/23 1454 12/12/23 1941  HGB 10.1*  --  10.5* 9.7*  --   --   --   --   --   HCT 30.8*  --  32.8* 29.2*  --   --   --   --   --   PLT 131*  --  114* 97*  --   --   --   --   --   APTT  --   --   --   --   --   --  37*  --   --   LABPROT  --   --   --   --   --  18.4*  --   --   --   INR  --   --   --   --   --  1.5*  --   --   --   HEPARINUNFRC  --   --   --   --   --   --   --   --  0.17*  CREATININE 0.60*  --  0.59* 0.62  --   --   --   --   --   TROPONINIHS  --    < > 14 256* 270*  --   --  313* 287*   < > = values in this interval not displayed.    Estimated Creatinine Clearance: 110.5 mL/min (by C-G formula based on SCr of 0.62 mg/dL).   Medical History: Past Medical History:  Diagnosis Date   (HFpEF) heart failure with preserved ejection fraction (HCC)    Addison disease (HCC)    CAD (coronary artery disease)    a. 01/2011 Anterior STEMI/Cath/PCI: LM nl, LAD 100d (3.5x82mm Vision BMS placed), LCX 50m, RI 50, RCA min irregs, EF 40% w/ apical, inferoapical HK; b. 10/2022 NSTSEMI/Cath: LM nl, LAD 5p/m, RI 85, LCX 124m, OM1 mild dzs, OM3 100 fills via L->L collats from dLAD, RCA 80p, 118m, RPDA fills via collats from LAD. EF 45-50%-->Med Rx.   CAD, multiple vessel    Cardiac arrest - ventricular fibrillation    a. In setting of STEMI 01/2011   Chronic, continuous use of opioids     Cocaine abuse (HCC)    COPD (chronic obstructive pulmonary disease) (HCC)    Depression with anxiety    ETOH abuse    a. 6-12 beers/day   GERD (gastroesophageal reflux disease)    GI bleed    Hemorrhoids    Hyperlipidemia    Hypertension    Ischemic cardiomyopathy    a. 06/2011 Echo: EF 45-50%, No rwma; b. 10/2022 Echo: EF 55-60%, no rwma, nl RV fxn.   Liver cirrhosis, alcoholic (HCC)    Marijuana abuse    Migraines    Noncompliance with immunization regimen    Obesity (BMI 30.0-34.9)    Orthostatic hypertension    midodrine   Portal hypertension (HCC)  s/p TIPS   STEMI (ST elevation myocardial infarction) (HCC) 2012   Tobacco abuse    a. 1/2 ppd x 26 yrs   Medications:  Not on anticoagulation prior to admission  Assessment: Patient is a 52 year old male with a past medical history of liver cirrhosis s/p TIPS, superior mesenteric artery stenosis s/p stenting, CAD, STEMI s/p PCI with stenting, HFpEF, ischemic cardiomyopathy, prior V-fib cardiac arrest, hypertension, hyperlipidemia, GERD, migraine headaches polysubstance abuse (cocaine, alcohol, marijuana and tobacco), orthostatic hypotension, Addison's/adrenal insufficiency, chronic narcotic dependence, and recurrent GI bleeds who presented with chest pain. Troponin 256>270. Pharmacy was consulted to initiate patient on a heparin infusion for ACS/STEMI. Patient was not on anticoagulation at home.   12/27 1941 HL 0.17 subtherapeutic on 1000 units/hr  Goal of Therapy:  Heparin level 0.3-0.7 units/ml Monitor platelets by anticoagulation protocol: Yes   Plan:  Will not give any boluses per MD given patient's history of recurrent GI bleeds Increase heparin infusion to 1300 units/hr Check HL in 6 hours after rate change Continue to monitor CBC daily while on heparin   Thank you for involving pharmacy in this patient's care.   Rockwell Alexandria, PharmD Clinical Pharmacist 12/12/2023 9:10 PM

## 2023-12-12 NOTE — ED Triage Notes (Signed)
Pt by [facility] for chest pain seen here for the same yesterday but left prior to being seen. EMS gave asa 324 mg and 1 inch of nitroglycerin past.

## 2023-12-12 NOTE — ED Notes (Signed)
Warm blanket and ice water provided.

## 2023-12-12 NOTE — Assessment & Plan Note (Addendum)
Appears stable over the last 2 months

## 2023-12-12 NOTE — Assessment & Plan Note (Signed)
Mild.

## 2023-12-12 NOTE — Hospital Course (Signed)
Mr. Adryn Ragains is a 52 year old male with history of CAD status post PCI to the LAD, adrenal insufficiency, hyperlipidemia, GERD, aortic atherosclerosis, cardiomegaly, pulmonary arterial hypertension, cirrhosis of the liver, portal hypertension status post TIPS, history of SMA stenting, obesity, history of polysubstance abuse, including cocaine, EtOH, tobacco use, who presents emergency department for chief concerns of chest pain.  Per ED staff report, patient was given aspirin 324 mg p.o. and nitroglycerin 1 inch per EMS.  Vitals in the ED showed temperature 98.2, respiration rate of 20, heart rate 106, blood pressure 102/47, improved to 98/59, SpO2 of 97% on room air.  Serum sodium is 137, potassium 3.4, chloride 105, bicarb 20, BUN of 8, serum creatinine 0.62, EGFR greater than 60, nonfasting blood glucose 131, WBC 4.5, hemoglobin 9.7, platelets of 97.  AST was 56, ALT 16.  T. bili was elevated at 3.0.  High sensitive troponin was 256 and on repeat was 270.  COVID/influenza A/influenza B/RSV PCR were negative.  ED treatment: Toradol 50 mg IV one-time dose, morphine 4 mg IV one-time dose, ondansetron 4 mg IV, sodium chloride 500 mL bolus, heparin GTT per pharmacy.

## 2023-12-12 NOTE — Assessment & Plan Note (Addendum)
Does not appear to be in acute exacerbation DuoNebs every 6 hours as needed for wheezing, shortness of breath, 4 days ordered

## 2023-12-12 NOTE — Assessment & Plan Note (Addendum)
With patient report of chest pain With history of CAD status post PCI to LAD, hyperlipidemia, hypertension Continue heparin per pharmacy Complete echo ordered Repeat high since he troponin to ensure stabilization versus downtrending Check UDS Nitroglycerin 1 inch every 6 hours as needed for chest pain, 2 days ordered; morphine 2 mg IV every 4 hours as needed for moderate pain, 20 hours of coverage ordered; morphine 4 mg IV every 4 hours as needed for severe pain, 20 hours of coverage ordered AM team to consult cardiology as appropriate pending complete echo Admit to telemetry cardiac, inpatient

## 2023-12-12 NOTE — H&P (Addendum)
History and Physical   Ethan Wells ZOX:[id number] DOB: 11-19-1971 DOA: 12/12/2023  PCP: Pcp, No  Patient coming from: home via EMS  I have personally briefly reviewed patient's old medical records in [facility] EMR.  Chief Concern: Chest pain  HPI: Ethan Wells is a 52 year old male with history of CAD status post PCI to the LAD, adrenal insufficiency, hyperlipidemia, GERD, aortic atherosclerosis, cardiomegaly, pulmonary arterial hypertension, cirrhosis of the liver, portal hypertension status post TIPS, history of SMA stenting, obesity, history of polysubstance abuse, including cocaine, EtOH, tobacco use, who presents emergency department for chief concerns of chest pain.  Per ED staff report, patient was given aspirin 324 mg p.o. and nitroglycerin 1 inch per EMS.  Vitals in the ED showed temperature 98.2, respiration rate of 20, heart rate 106, blood pressure 102/47, improved to 98/59, SpO2 of 97% on room air.  Serum sodium is 137, potassium 3.4, chloride 105, bicarb 20, BUN of 8, serum creatinine 0.62, EGFR greater than 60, nonfasting blood glucose 131, WBC 4.5, hemoglobin 9.7, platelets of 97.  AST was 56, ALT 16.  T. bili was elevated at 3.0.  High sensitive troponin was 256 and on repeat was 270.  COVID/influenza A/influenza B/RSV PCR were negative.  ED treatment: Toradol 50 mg IV one-time dose, morphine 4 mg IV one-time dose, ondansetron 4 mg IV, sodium chloride 500 mL bolus, heparin GTT per pharmacy. -------------------------------------- At bedside, patient able to tell me his name, age, location, current calendar year.  He reports he was sleeping when approximately at 6 AM to 6:30 AM, he developed pressure chest pain that woke him up from sleep.  He also endorses breaking in a cold sweat.  He then went back to sleep in the same chest pressure woke him up again at 8 AM.  He reports this feels similar to 2012 and in 2023 when he had heart attacks with stent  placement.  He reports he last used cocaine about 1 week ago before Christmas.  He smokes half a pack per day now he is trying to quit.  He also drinks 2 wine coolers per day and he is trying to quit drinking alcohol.  He denies trauma to his person.  He reports infrequent watery diarrhea.  He reports sometimes there is blood in it however this has not been a recent event.  Patient reports no chest pain currently.  Social history: He lives at home with his mother and his niece.  He is currently the caregiver for his mother.  He endorses daily tobacco, EtOH use.  He endorses regular cocaine use about once per week.  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  ROS: Constitutional: no weight change, no fever ENT/Mouth: no sore throat, no rhinorrhea Eyes: no eye pain, no vision changes Cardiovascular: + chest pain, no dyspnea,  no edema, no palpitations Respiratory: no cough, no sputum, no wheezing Gastrointestinal: no nausea, no vomiting, + diarrhea, no constipation Genitourinary: no urinary incontinence, no dysuria, no hematuria Musculoskeletal: no arthralgias, no myalgias Skin: no skin lesions, no pruritus, Neuro: no weakness, no loss of consciousness, no syncope Psych: no anxiety, no depression, no decrease appetite Heme/Lymph: no bruising, no bleeding  ED Course: Discussed with EDP, patient requiring hospitalization for chief concerns of elevated high sensitive troponin.  Assessment/Plan  Principal Problem:   NSTEMI (non-ST elevated myocardial infarction) (HCC) Active Problems:   Adrenal insufficiency (Addison's disease) (HCC)   Alcoholic cirrhosis (HCC)   Esophageal reflux  Mesenteric artery stenosis (HCC)   Adrenal insufficiency (HCC)   HLD (hyperlipidemia)   Hypokalemia   Cocaine dependence with cocaine-induced disorder (HCC)   Depression with anxiety   Alcohol use disorder   Polysubstance abuse (HCC)   Tobacco abuse   History of ST elevation  myocardial infarction (STEMI)   Alcohol dependence (HCC)   Hyperbilirubinemia   Secondary esophageal varices with bleeding (HCC)   Metabolic acidosis   Elevated liver function tests   COPD (chronic obstructive pulmonary disease) (HCC)   Essential hypertension   Assessment and Plan:  * NSTEMI (non-ST elevated myocardial infarction) (HCC) With patient report of chest pain With history of CAD status post PCI to LAD, hyperlipidemia, hypertension Continue heparin per pharmacy Complete echo ordered Repeat high since he troponin to ensure stabilization versus downtrending Check UDS Nitroglycerin 1 inch every 6 hours as needed for chest pain, 2 days ordered; morphine 2 mg IV every 4 hours as needed for moderate pain, 20 hours of coverage ordered; morphine 4 mg IV every 4 hours as needed for severe pain, 20 hours of coverage ordered AM team to consult cardiology as appropriate pending complete echo Admit to telemetry cardiac, inpatient  Polysubstance abuse (HCC) Check UDS PDMP reviewed: Current active prescription include oxycodone 5 mg tablet, 15 tablets was written and filled on 09/08/2023; gabapentin 100 mg capsule, 60 capsules was written and filled on 10/18/2023.  Tobacco abuse As needed nicotine patch ordered  Essential hypertension Hydralazine 5 mg IV every 6 hours as needed for SBP greater than 175, 5 days ordered  COPD (chronic obstructive pulmonary disease) (HCC) Does not appear to be in acute exacerbation DuoNebs every 6 hours as needed for wheezing, shortness of breath, 4 days ordered  Metabolic acidosis Mild  Hyperbilirubinemia Appears stable over the last 2 months  Alcohol dependence (HCC) Counseling given for cessation Patient states his last drink was 12/11/2023, it was 2 wine coolers Ativan 1 mg IV as needed for anxiety, seizure, 3 doses ordered with instructions to administer as appropriate and then let provider know as patient has history of alcohol dependence and  may need CIWA precaution. On admission, patient is not within withdrawal window  Chart reviewed.   DVT prophylaxis: Heparin per pharmacy Code Status: Full code Diet: Heart healthy Family Communication: No Disposition Plan: Pending clinical course Consults called: Pharmacy; AM team to consult cardiology service pending complete echo as appropriate Admission status: Telemetry cardiac, inpatient  Past Medical History:  Diagnosis Date   (HFpEF) heart failure with preserved ejection fraction (HCC)    Addison disease (HCC)    CAD (coronary artery disease)    a. 01/2011 Anterior STEMI/Cath/PCI: LM nl, LAD 100d (3.5x21mm Vision BMS placed), LCX 92m, RI 50, RCA min irregs, EF 40% w/ apical, inferoapical HK; b. 10/2022 NSTSEMI/Cath: LM nl, LAD 5p/m, RI 85, LCX 150m, OM1 mild dzs, OM3 100 fills via L->L collats from dLAD, RCA 80p, 113m, RPDA fills via collats from LAD. EF 45-50%-->Med Rx.   CAD, multiple vessel    Cardiac arrest - ventricular fibrillation    a. In setting of STEMI 01/2011   Chronic, continuous use of opioids    Cocaine abuse (HCC)    COPD (chronic obstructive pulmonary disease) (HCC)    Depression with anxiety    ETOH abuse    a. 6-12 beers/day   GERD (gastroesophageal reflux disease)    GI bleed    Hemorrhoids    Hyperlipidemia    Hypertension    Ischemic cardiomyopathy  a. 06/2011 Echo: EF 45-50%, No rwma; b. 10/2022 Echo: EF 55-60%, no rwma, nl RV fxn.   Liver cirrhosis, alcoholic (HCC)    Marijuana abuse    Migraines    Noncompliance with immunization regimen    Obesity (BMI 30.0-34.9)    Orthostatic hypertension    midodrine   Portal hypertension (HCC)    s/p TIPS   STEMI (ST elevation myocardial infarction) (HCC) 2012   Tobacco abuse    a. 1/2 ppd x 26 yrs   Past Surgical History:  Procedure Laterality Date   COLONOSCOPY  08/09/2021   Procedure: COLONOSCOPY;  Surgeon: Midge Minium, MD;  Location: [facility] ENDOSCOPY;  Service: Endoscopy;;   CORONARY  ANGIOPLASTY WITH STENT PLACEMENT     ESOPHAGEAL BANDING  04/08/2023   Procedure: ESOPHAGEAL BANDING;  Surgeon: Tressia Danas, MD;  Location: [facility] ENDOSCOPY;  Service: Gastroenterology;;   ESOPHAGOGASTRODUODENOSCOPY N/A 05/30/2022   Procedure: ESOPHAGOGASTRODUODENOSCOPY (EGD);  Surgeon: Toney Reil, MD;  Location: [facility] ENDOSCOPY;  Service: Gastroenterology;  Laterality: N/A;   ESOPHAGOGASTRODUODENOSCOPY (EGD) WITH PROPOFOL N/A 03/01/2021   Procedure: ESOPHAGOGASTRODUODENOSCOPY (EGD) WITH PROPOFOL;  Surgeon: Toney Reil, MD;  Location: [facility];  Service: Endoscopy;  Laterality: N/A;   ESOPHAGOGASTRODUODENOSCOPY (EGD) WITH PROPOFOL N/A 08/09/2021   Procedure: ESOPHAGOGASTRODUODENOSCOPY (EGD) WITH PROPOFOL;  Surgeon: Midge Minium, MD;  Location: [facility] ENDOSCOPY;  Service: Endoscopy;  Laterality: N/A;   ESOPHAGOGASTRODUODENOSCOPY (EGD) WITH PROPOFOL N/A 03/01/2023   Procedure: ESOPHAGOGASTRODUODENOSCOPY (EGD) WITH PROPOFOL;  Surgeon: Jaynie Collins, DO;  Location: [facility] ENDOSCOPY;  Service: Gastroenterology;  Laterality: N/A;   ESOPHAGOGASTRODUODENOSCOPY (EGD) WITH PROPOFOL N/A 04/08/2023   Procedure: ESOPHAGOGASTRODUODENOSCOPY (EGD) WITH PROPOFOL;  Surgeon: Tressia Danas, MD;  Location: [facility] ENDOSCOPY;  Service: Gastroenterology;  Laterality: N/A;   ESOPHAGOGASTRODUODENOSCOPY (EGD) WITH PROPOFOL N/A 08/09/2023   Procedure: ESOPHAGOGASTRODUODENOSCOPY (EGD) WITH PROPOFOL;  Surgeon: Regis Bill, MD;  Location: [facility] ENDOSCOPY;  Service: Endoscopy;  Laterality: N/A;   ESOPHAGOGASTRODUODENOSCOPY (EGD) WITH PROPOFOL N/A 08/19/2023   Procedure: ESOPHAGOGASTRODUODENOSCOPY (EGD) WITH PROPOFOL;  Surgeon: Regis Bill, MD;  Location: [facility] ENDOSCOPY;  Service: Endoscopy;  Laterality: N/A;   EVALUATION UNDER ANESTHESIA WITH HEMORRHOIDECTOMY N/A 10/31/2022   Procedure: EXAM UNDER ANESTHESIA WITH SUTURE LIGATION OF TWO BLEEDING PEDICLES;  Surgeon: Henrene Dodge, MD;   Location: [facility] ORS;  Service: General;  Laterality: N/A;   FLEXIBLE SIGMOIDOSCOPY N/A 10/02/2022   Procedure: FLEXIBLE SIGMOIDOSCOPY;  Surgeon: Meryl Dare, MD;  Location: [facility] ENDOSCOPY;  Service: Gastroenterology;  Laterality: N/A;   HEMOSTASIS CONTROL  08/09/2023   Procedure: HEMOSTASIS CONTROL;  Surgeon: Regis Bill, MD;  Location: [facility] ENDOSCOPY;  Service: Endoscopy;;   IR TIPS  04/11/2023   IR US GUIDE VASC ACCESS RIGHT  04/11/2023   IR US GUIDE VASC ACCESS RIGHT  04/11/2023   IR US GUIDE VASC ACCESS RIGHT  04/11/2023   LEFT HEART CATH AND CORONARY ANGIOGRAPHY N/A 11/04/2022   Procedure: LEFT HEART CATH AND CORONARY ANGIOGRAPHY;  Surgeon: Iran Ouch, MD;  Location: [facility] INVASIVE CV LAB;  Service: Cardiovascular;  Laterality: N/A;   RADIOLOGY WITH ANESTHESIA N/A 04/11/2023   Procedure: IR WITH ANESTHESIA;  Surgeon: Oley Balm, MD;  Location: [facility] OR;  Service: Radiology;  Laterality: N/A;   VISCERAL ANGIOGRAPHY N/A 10/24/2022   Procedure: VISCERAL ANGIOGRAPHY;  Surgeon: Annice Needy, MD;  Location: [facility] INVASIVE CV LAB;  Service: Cardiovascular;  Laterality: N/A;   VISCERAL ARTERY INTERVENTION N/A 05/09/2023   Procedure: VISCERAL ARTERY INTERVENTION;  Surgeon: Renford Dills, MD;  Location:  [facility] INVASIVE CV LAB;  Service: Cardiovascular;  Laterality: N/A;   Social History:  reports that he has been smoking cigarettes. He has never used smokeless tobacco. He reports current alcohol use of about 2.0 standard drinks of alcohol per week. He reports current drug use. Drug: Cocaine.  Allergies  Allergen Reactions   Penicillins Hives and Rash    Tolerated Ceftriaxone 10/2022   Family History  Problem Relation Age of Onset   Coronary artery disease Mother        alive   Other Other        2 sisters alive and well   Alcohol abuse Father        died @ 69   Other Paternal Grandmother        PACER   Family history: Family history reviewed and not pertinent.  Prior to  Admission medications   Medication Sig Start Date End Date Taking? Authorizing Provider  albuterol (VENTOLIN HFA) 108 (90 Base) MCG/ACT inhaler Inhale 2 puffs into the lungs every 6 (six) hours as needed for wheezing or shortness of breath. 10/15/23   Chesley Noon, MD  docusate sodium (COLACE) 100 MG capsule Take 1 capsule (100 mg total) by mouth 2 (two) times daily as needed (if less than 3 BM"s in past 24 hours). 08/25/23   Pennie Banter, DO  feeding supplement (ENSURE ENLIVE / ENSURE PLUS) LIQD Take 237 mLs by mouth 2 (two) times daily between meals. 08/25/23   Esaw Grandchild A, DO  fluticasone furoate-vilanterol (BREO ELLIPTA) 100-25 MCG/ACT AEPB Inhale 1 puff into the lungs daily. Patient not taking: Reported on 11/23/2023 10/19/23   Sunnie Nielsen, DO  furosemide (LASIX) 40 MG tablet Take 1 tablet (40 mg total) by mouth daily. Increase to 1 tablet (40 mg total) by mouth TWICE daily (total daily dose 80 mg) as needed for up to 3 days for increased leg swelling, shortness of breath, weight gain 5+ lbs over 1-2 days. Seek medical care if these symptoms are not improving with increased dose. Patient not taking: Reported on 11/23/2023 10/18/23 12/17/23  Sunnie Nielsen, DO  gabapentin (NEURONTIN) 100 MG capsule Take 1 capsule (100 mg total) by mouth 2 (two) times daily. 10/18/23 11/17/23  Sunnie Nielsen, DO  ipratropium-albuterol (DUONEB) 0.5-2.5 (3) MG/3ML SOLN Take 3 mLs by nebulization every 2 (two) hours as needed (wheeze, SOB). 10/18/23   Sunnie Nielsen, DO  isosorbide mononitrate (IMDUR) 30 MG 24 hr tablet Take 0.5 tablets (15 mg total) by mouth daily. 11/24/23   Wouk, Wilfred Curtis, MD  meloxicam (MOBIC) 15 MG tablet Take 1 tablet (15 mg total) by mouth daily for 14 days. 12/11/23 12/25/23  Merwyn Katos, MD  methocarbamol (ROBAXIN) 500 MG tablet Take 1 tablet (500 mg total) by mouth 4 (four) times daily for 10 days. 12/11/23 12/21/23  Merwyn Katos, MD  midodrine (PROAMATINE) 5 MG  tablet Take 1 tablet (5 mg total) by mouth 3 (three) times daily with meals. Patient not taking: Reported on 11/23/2023 10/18/23   Sunnie Nielsen, DO  Nebulizers (COMPRESSOR/NEBULIZER) MISC Nebulizer + mask/hoses per patient preference and insurance coverage, use as directed 10/18/23   Sunnie Nielsen, DO  pantoprazole (PROTONIX) 40 MG tablet Take 1 tablet (40 mg total) by mouth 2 (two) times daily. 10/18/23   Sunnie Nielsen, DO  polyethylene glycol powder (GLYCOLAX/MIRALAX) 17 GM/SCOOP powder Take 17 g by mouth daily as needed (if less than 3 BM"s in past 24 hours). 08/25/23   Pennie Banter, DO  potassium chloride SA (KLOR-CON M) 20 MEQ tablet Take 1 tablet (20 mEq total) by mouth daily. Patient not taking: Reported on 11/23/2023 10/18/23   Sunnie Nielsen, DO  predniSONE (STERAPRED UNI-PAK 21 TAB) 10 MG (21) TBPK tablet As directed on packaging 12/11/23   Merwyn Katos, MD  QUEtiapine (SEROQUEL) 50 MG tablet Take 1 tablet (50 mg total) by mouth at bedtime. Patient not taking: Reported on 11/23/2023 10/18/23 11/23/23  Sunnie Nielsen, DO  Respiratory Therapy Supplies (NEBULIZER/TUBING/MOUTHPIECE) KIT As directed 10/18/23   Sunnie Nielsen, DO  rosuvastatin (CRESTOR) 40 MG tablet TAKE 1 TABLET BY MOUTH EVERY DAY Patient not taking: Reported on 11/23/2023 10/20/23   [name], NP   Physical Exam: Vitals:   12/12/23 1230 12/12/23 1249 12/12/23 1330 12/12/23 1400  BP: (!) 98/59  (!) 107/55 (!) 114/44  Pulse: 87  88 (!) 109  Resp: 14   18  Temp:  98.1 F (36.7 C)    TempSrc:  Oral    SpO2: 96%  97% 97%  Weight:      Height:       Constitutional: appears older than chronological age, NAD, calm, hair is matted Eyes: PERRL, lids and conjunctivae normal ENMT: Mucous membranes are moist. Poor dentition. Hearing appropriate Neck: normal, supple, no masses, no thyromegaly Respiratory: clear to auscultation bilaterally, no wheezing, no crackles. Normal respiratory effort.  No accessory muscle use.  Cardiovascular: Regular rate and rhythm, no murmurs / rubs / gallops. No extremity edema. 2+ pedal pulses. No carotid bruits.  Abdomen: no tenderness, no masses palpated, no hepatosplenomegaly. Bowel sounds positive.  Musculoskeletal: no clubbing / cyanosis. No joint deformity upper and lower extremities. Good ROM, no contractures, no atrophy. Normal muscle tone.  Skin: no rashes, lesions, ulcers. No induration.  Neurologic: Sensation intact. Strength 5/5 in all 4.  Psychiatric: Lacks judgment and insight in setting of continued alcohol use with known liver cirrhosis and portal hypertension, continued cocaine use with history of CAD. Alert and oriented x 3.  Depressed mood.   EKG: independently reviewed, showing sinus tachycardia with rate of 109, QTc 487  Chest x-ray on Admission: I personally reviewed and I agree with radiologist reading as below.  CT Angio Chest PE W and/or Wo Contrast Result Date: 12/12/2023 CLINICAL DATA:  Pulmonary embolism (PE) suspected, high prob. Chest pain. EXAM: CT ANGIOGRAPHY CHEST WITH CONTRAST TECHNIQUE: Multidetector CT imaging of the chest was performed using the standard protocol during bolus administration of intravenous contrast. Multiplanar CT image reconstructions and MIPs were obtained to evaluate the vascular anatomy. RADIATION DOSE REDUCTION: This exam was performed according to the departmental dose-optimization program which includes automated exposure control, adjustment of the mA and/or kV according to patient size and/or use of iterative reconstruction technique. CONTRAST:  75mL OMNIPAQUE IOHEXOL 350 MG/ML SOLN COMPARISON:  CTA chest, abdomen, pelvis dated November 14, 2023. FINDINGS: Cardiovascular: Satisfactory opacification of the pulmonary arteries to the segmental level. No evidence of pulmonary embolism. There is dilation of the main pulmonary artery measuring up to 3.7 cm in diameter, which can be seen in the setting of  pulmonary arterial hypertension. The heart is enlarged, unchanged. No pericardial effusion. The thoracic aorta is normal in caliber. Atherosclerotic calcification of the thoracic aorta and coronary arteries. Mediastinum/Nodes: No enlarged mediastinal, hilar, or axillary lymph nodes. Adherent secretions within the trachea. Thyroid gland and esophagus demonstrate no significant findings. Lungs/Pleura: No focal consolidation, pleural effusion, or pneumothorax. Unchanged 5 mm right middle lobe nodule (series 5, image 84). No  specific follow-up imaging recommended. Upper Abdomen: Partially imaged tips and celiac artery and SMA stent. Similar cirrhotic morphology of the liver. Musculoskeletal: No acute or significant osseous findings. Similar defect at the superolateral left humeral head, may reflect sequela of prior dislocation. Review of the MIP images confirms the above findings. IMPRESSION: 1. No evidence of pulmonary embolism. 2. No acute intrathoracic findings. 3. Dilation of the main pulmonary artery, which can be seen in the setting of pulmonary arterial hypertension. 4. Cardiomegaly. 5.  Aortic Atherosclerosis (ICD10-I70.0). Electronically Signed   By: Hart Robinsons M.D.   On: 12/12/2023 11:40   DG Chest 2 View Result Date: 12/12/2023 CLINICAL DATA:  Chest pain. EXAM: CHEST - 2 VIEW COMPARISON:  Chest radiograph dated November 24, 2023. FINDINGS: The heart size and mediastinal contours are unchanged. No focal consolidation, pleural effusion, or pneumothorax. No acute osseous abnormality. IMPRESSION: No acute cardiopulmonary findings. Electronically Signed   By: Hart Robinsons M.D.   On: 12/12/2023 09:44   MR LUMBAR SPINE WO CONTRAST Result Date: 12/11/2023 CLINICAL DATA:  52 year old male with acute on chronic low back pain, difficulty walking. Bilateral leg weakness. "Legs do not work". EXAM: MRI LUMBAR SPINE WITHOUT CONTRAST TECHNIQUE: Multiplanar, multisequence MR imaging of the lumbar spine was  performed. No intravenous contrast was administered. COMPARISON:  CT Abdomen and Pelvis 11/14/2023. Lumbar MRI 05/07/2023. FINDINGS: Segmentation: Normal on the comparison CT, the same numbering system used on the May MRI. Alignment: Stable lumbar lordosis since May. No significant scoliosis. Mild degenerative appearing retrolisthesis of L5 on S1. Vertebrae: Patchy endplate marrow edema at L5-S1 appears likely to be degenerative in nature, with vacuum disc there last month, still visible by MRI today, new since May. Surrounding disc desiccation. Mild disc space loss since May. Maintained vertebral body height. Background bone marrow signal is stable, with borderline to mild generalized nonspecific decreased T1 signal. But no other areas of marrow edema. Visible sacrum and SI joints appear intact. Conus medullaris and cauda equina: Conus extends to the T12-L1 level. No lower spinal cord or conus signal abnormality. Normal cauda equina nerve roots. Paraspinal and other soft tissues: Negative visible abdominal viscera. No convincing paraspinal soft tissue inflammation. Disc levels: T11-T12: Some disc desiccation but no disc bulge or protrusion. Mild posterior element hypertrophy. No spinal stenosis. T12-L1:  Negative. L1-L2:  Negative. L2-L3: Minimal, far lateral disc bulging. Mild facet and ligament flavum hypertrophy. No stenosis. L3-L4: Mild disc desiccation and circumferential disc bulge. Mild posterior element hypertrophy. No significant stenosis. L4-L5: Mild disc space loss and circumferential disc bulge asymmetric to the right. Broad-based right foraminal disc does appear mildly increased since May (series 8, image 23). Only mild facet hypertrophy. No spinal or lateral recess stenosis. But mild to moderate right L4 neural foraminal stenosis which is stable versus mildly increased. L5-S1: Increased disc desiccation, new vacuum disc since May. Circumferential disc bulge with indistinct and bulky left far lateral  disc extrusion now (series 8, image 30). Superimposed mild facet and ligament flavum hypertrophy. Trace facet joint fluid on the left is chronic. There is no spinal or lateral recess stenosis. However, moderate to severe left L5 neural foraminal stenosis has increased. And contralateral mild to moderate right foraminal stenosis is new. IMPRESSION: 1. Compared to May MRI the dominant finding is progressed and far lateral L5-S1 disc extrusion, with new vacuum disc there which was present on CTA last month. Mild endplate marrow edema at that level appears to be degenerative in nature. Subsequent increased and moderate to severe  left and up to moderate right L5 neural foraminal stenosis. Query L5 radiculitis. 2. L4-L5 right foraminal disc protrusion also appears mildly increased. Query right L4 radiculitis. 3. No significant lumbar spinal stenosis. No visible lower thoracic spinal stenosis. Electronically Signed   By: Odessa Fleming M.D.   On: 12/11/2023 07:48   Labs on Admission: I have personally reviewed following labs  CBC: Recent Labs  Lab 12/11/23 0427 12/11/23 2228 12/12/23 0900  WBC 5.7 6.2 4.5  HGB 10.1* 10.5* 9.7*  HCT 30.8* 32.8* 29.2*  MCV 95.7 97.0 94.8  PLT 131* 114* 97*   Basic Metabolic Panel: Recent Labs  Lab 12/11/23 0427 12/11/23 2228 12/12/23 0900  NA 135 137 137  K 3.7 3.4* 3.4*  CL 104 106 105  CO2 21* 16* 20*  GLUCOSE 118* 146* 131*  BUN 7 6 8   CREATININE 0.60* 0.59* 0.62  CALCIUM 8.6* 8.8* 8.7*  MG  --   --  1.5*   GFR: Estimated Creatinine Clearance: 110.5 mL/min (by C-G formula based on SCr of 0.62 mg/dL).  Liver Function Tests: Recent Labs  Lab 12/12/23 0900  AST 56*  ALT 16  ALKPHOS 165*  BILITOT 3.0*  PROT 7.0  ALBUMIN 3.0*   Urine analysis:    Component Value Date/Time   COLORURINE YELLOW (A) 12/11/2023 0427   APPEARANCEUR HAZY (A) 12/11/2023 0427   APPEARANCEUR Clear 03/16/2012 1315   LABSPEC 1.012 12/11/2023 0427   LABSPEC 1.013 03/16/2012  1315   PHURINE 5.0 12/11/2023 0427   GLUCOSEU NEGATIVE 12/11/2023 0427   GLUCOSEU 50 mg/dL 16/09/9603 5409   HGBUR NEGATIVE 12/11/2023 0427   BILIRUBINUR NEGATIVE 12/11/2023 0427   BILIRUBINUR Negative 03/16/2012 1315   KETONESUR NEGATIVE 12/11/2023 0427   PROTEINUR NEGATIVE 12/11/2023 0427   NITRITE NEGATIVE 12/11/2023 0427   LEUKOCYTESUR NEGATIVE 12/11/2023 0427   LEUKOCYTESUR Negative 03/16/2012 1315   This document was prepared using Dragon Voice Recognition software and may include unintentional dictation errors.  Dr. Sedalia Muta [facility]  If 7PM-7AM, please contact overnight-coverage provider If 7AM-7PM, please contact day attending provider [email or web address]  12/12/2023, 2:43 PM

## 2023-12-12 NOTE — Assessment & Plan Note (Signed)
Counseling given for cessation Patient states his last drink was 12/11/2023, it was 2 wine coolers Ativan 1 mg IV as needed for anxiety, seizure, 3 doses ordered with instructions to administer as appropriate and then let provider know as patient has history of alcohol dependence and may need CIWA precaution. On admission, patient is not within withdrawal window

## 2023-12-12 NOTE — Progress Notes (Signed)
PHARMACY - ANTICOAGULATION CONSULT NOTE  Pharmacy Consult for Heparin Infusion Indication: chest pain/ACS  Allergies  Allergen Reactions   Penicillins Hives and Rash    Tolerated Ceftriaxone 10/2022    Patient Measurements: Height: 5\' 5"  (165.1 cm) Weight: 86.6 kg (191 lb) IBW/kg (Calculated) : 61.5 Heparin Dosing Weight: 79.8 kg  Vital Signs: Temp: 98.2 F (36.8 C) (12/27 0844) Temp Source: Oral (12/27 0844) BP: 98/59 (12/27 1230) Pulse Rate: 87 (12/27 1230)  Labs: Recent Labs    12/11/23 0427 12/11/23 2228 12/12/23 0900 12/12/23 1035  HGB 10.1* 10.5* 9.7*  --   HCT 30.8* 32.8* 29.2*  --   PLT 131* 114* 97*  --   CREATININE 0.60* 0.59* 0.62  --   TROPONINIHS  --  14 256* 270*    Estimated Creatinine Clearance: 110.5 mL/min (by C-G formula based on SCr of 0.62 mg/dL).   Medical History: Past Medical History:  Diagnosis Date   (HFpEF) heart failure with preserved ejection fraction (HCC)    Addison disease (HCC)    CAD (coronary artery disease)    a. 01/2011 Anterior STEMI/Cath/PCI: LM nl, LAD 100d (3.5x90mm Vision BMS placed), LCX 48m, RI 50, RCA min irregs, EF 40% w/ apical, inferoapical HK; b. 10/2022 NSTSEMI/Cath: LM nl, LAD 5p/m, RI 85, LCX 159m, OM1 mild dzs, OM3 100 fills via L->L collats from dLAD, RCA 80p, 134m, RPDA fills via collats from LAD. EF 45-50%-->Med Rx.   CAD, multiple vessel    Cardiac arrest - ventricular fibrillation    a. In setting of STEMI 01/2011   Chronic, continuous use of opioids    Cocaine abuse (HCC)    COPD (chronic obstructive pulmonary disease) (HCC)    Depression with anxiety    ETOH abuse    a. 6-12 beers/day   GERD (gastroesophageal reflux disease)    GI bleed    Hemorrhoids    Hyperlipidemia    Hypertension    Ischemic cardiomyopathy    a. 06/2011 Echo: EF 45-50%, No rwma; b. 10/2022 Echo: EF 55-60%, no rwma, nl RV fxn.   Liver cirrhosis, alcoholic (HCC)    Marijuana abuse    Migraines    Noncompliance with  immunization regimen    Obesity (BMI 30.0-34.9)    Orthostatic hypertension    midodrine   Portal hypertension (HCC)    s/p TIPS   STEMI (ST elevation myocardial infarction) (HCC) 2012   Tobacco abuse    a. 1/2 ppd x 26 yrs    Medications:  Not on anticoagulation prior to admission  Assessment: Patient is a 52 year old male with a past medical history of liver cirrhosis s/p TIPS, superior mesenteric artery stenosis s/p stenting, CAD, STEMI s/p PCI with stenting, HFpEF, ischemic cardiomyopathy, prior V-fib cardiac arrest, hypertension, hyperlipidemia, GERD, migraine headaches polysubstance abuse (cocaine, alcohol, marijuana and tobacco), orthostatic hypotension, Addison's/adrenal insufficiency, chronic narcotic dependence, and recurrent GI bleeds who presented with chest pain. Troponin 256>270. Pharmacy was consulted to initiate patient on a heparin infusion for ACS/STEMI. Patient was not on anticoagulation at home.   Baseline INR and aPTT ordered.  No signs/symptoms of bleeding noted in chart. Hgb 9.7. PLT 97.   Goal of Therapy:  Heparin level 0.3-0.7 units/ml Monitor platelets by anticoagulation protocol: Yes   Plan:  Will not give any boluses per MD given patient's history of recurrent GI bleeds Start heparin infusion at 1000 units/hr Check HL in 6 hours after infusion is started Continue to monitor CBC daily while on heparin  Merryl Hacker, PharmD Clinical Pharmacist 12/12/2023,12:46 PM

## 2023-12-12 NOTE — ED Provider Notes (Signed)
[facility] Provider Note    Event Date/Time   First MD Initiated Contact with Patient 12/12/23 [phone]     (approximate)   History   Chief Complaint: Chest Pain   HPI  Ethan Wells is a 52 y.o. male with a history of polysubstance abuse, heart failure, CAD, hypertension, COPD who comes the ED complaining of chest pain since yesterday morning.  Left without being seen yesterday.  Endorses some mild shortness of breath as well.          Physical Exam   Triage Vital Signs: ED Triage Vitals  Encounter Vitals Group     BP 12/12/23 0844 (!) 102/47     Systolic BP Percentile --      Diastolic BP Percentile --      Pulse Rate 12/12/23 0844 (!) 106     Resp 12/12/23 0844 20     Temp 12/12/23 0844 98.2 F (36.8 C)     Temp Source 12/12/23 0844 Oral     SpO2 12/12/23 0844 97 %     Weight 12/12/23 0843 191 lb (86.6 kg)     Height 12/12/23 0843 5\' 5"  (1.651 m)     Head Circumference --      Peak Flow --      Pain Score 12/12/23 0843 10     Pain Loc --      Pain Education --      Exclude from Growth Chart --     Most recent vital signs: Vitals:   12/12/23 1230 12/12/23 1249  BP: (!) 98/59   Pulse: 87   Resp: 14   Temp:  98.1 F (36.7 C)  SpO2: 96%     General: Awake, no distress.  CV:  Good peripheral perfusion.  Tachycardia, heart rate 110 Resp:  Normal effort.  Clear to auscultation bilaterally Abd:  No distention.  Soft nontender Other:  No lower extremity edema   ED Results / Procedures / Treatments   Labs (all labs ordered are listed, but only abnormal results are displayed) Labs Reviewed  CBC - Abnormal; Notable for the following components:      Result Value   RBC 3.08 (*)    Hemoglobin 9.7 (*)    HCT 29.2 (*)    RDW 17.0 (*)    Platelets 97 (*)    All other components within normal limits  COMPREHENSIVE METABOLIC PANEL - Abnormal; Notable for the following components:   Potassium 3.4 (*)    CO2 20 (*)     Glucose, Bld 131 (*)    Calcium 8.7 (*)    Albumin 3.0 (*)    AST 56 (*)    Alkaline Phosphatase 165 (*)    Total Bilirubin 3.0 (*)    All other components within normal limits  APTT - Abnormal; Notable for the following components:   aPTT 37 (*)    All other components within normal limits  PROTIME-INR - Abnormal; Notable for the following components:   Prothrombin Time 18.4 (*)    INR 1.5 (*)    All other components within normal limits  MAGNESIUM - Abnormal; Notable for the following components:   Magnesium 1.5 (*)    All other components within normal limits  TROPONIN I (HIGH SENSITIVITY) - Abnormal; Notable for the following components:   Troponin I (High Sensitivity) 256 (*)    All other components within normal limits  TROPONIN I (HIGH SENSITIVITY) - Abnormal; Notable for the following components:  Troponin I (High Sensitivity) 270 (*)    All other components within normal limits  RESP PANEL BY RT-PCR (RSV, FLU A&B, COVID)  RVPGX2  HEPARIN LEVEL (UNFRACTIONATED)  URINE DRUG SCREEN, QUALITATIVE ([facility] ONLY)  TROPONIN I (HIGH SENSITIVITY)     EKG Interpreted by me Sinus tachycardia rate 109.  S1Q3T3 pattern which appears to be new compared to November 14, 2023.  Poor R wave progression.  RADIOLOGY Chest x-ray interpreted by me, no edema effusion or pneumothorax.  Radiology report reviewed.  CT angiogram chest unremarkable   PROCEDURES:  Procedures   MEDICATIONS ORDERED IN ED: Medications  heparin ADULT infusion 100 units/mL (25000 units/287mL) (1,000 Units/hr Intravenous New Bag/Given 12/12/23 1303)  pantoprazole (PROTONIX) injection 40 mg (40 mg Intravenous Given 12/12/23 1304)  acetaminophen (TYLENOL) tablet 650 mg (has no administration in time range)    Or  acetaminophen (TYLENOL) suppository 650 mg (has no administration in time range)  ondansetron (ZOFRAN) tablet 4 mg (has no administration in time range)    Or  ondansetron (ZOFRAN) injection 4 mg (has  no administration in time range)  nitroGLYCERIN (NITROGLYN) 2 % ointment 1 inch (has no administration in time range)  morphine (PF) 2 MG/ML injection 2 mg (has no administration in time range)  ipratropium-albuterol (DUONEB) 0.5-2.5 (3) MG/3ML nebulizer solution 3 mL (has no administration in time range)  hydrALAZINE (APRESOLINE) injection 5 mg (has no administration in time range)  nicotine (NICODERM CQ - dosed in mg/24 hours) patch 21 mg (has no administration in time range)  ketorolac (TORADOL) 15 MG/ML injection 15 mg (15 mg Intravenous Given 12/12/23 0959)  sodium chloride 0.9 % bolus 500 mL (0 mLs Intravenous Stopped 12/12/23 1057)  iohexol (OMNIPAQUE) 350 MG/ML injection 75 mL (75 mLs Intravenous Contrast Given 12/12/23 1019)  morphine (PF) 4 MG/ML injection 4 mg (4 mg Intravenous Given 12/12/23 1141)  ondansetron (ZOFRAN) injection 4 mg (4 mg Intravenous Given 12/12/23 1141)     IMPRESSION / MDM / ASSESSMENT AND PLAN / ED COURSE  I reviewed the triage vital signs and the nursing notes.  DDx: Non-STEMI, PE, dissection, pneumonia, influenza, COVID  Patient's presentation is most consistent with acute presentation with potential threat to life or bodily function.  Patient presents with chest pain, recurrent issue despite prior workups.  CT angiogram of the chest repeated, negative for PE or other acute pulmonary findings.  Labs do show elevated troponin up to 270.  Prior baseline was within normal range.  Will need to start heparin for NSTEMI.  Due to his history of GI bleed, hypertensive gastropathy, duodenal ulcer, will start Protonix for GI prophylaxis as well.   Clinical Course as of 12/12/23 1402  Fri Dec 12, 2023  1312 Case d/w hospitalist.  [PS]    Clinical Course User Index [PS] Sharman Cheek, MD     FINAL CLINICAL IMPRESSION(S) / ED DIAGNOSES   Final diagnoses:  NSTEMI (non-ST elevated myocardial infarction) [facility])     Rx / DC Orders   ED Discharge Orders      None        Note:  This document was prepared using Dragon voice recognition software and may include unintentional dictation errors.   Sharman Cheek, MD 12/12/23 [phone]

## 2023-12-12 NOTE — Assessment & Plan Note (Addendum)
Check [place] reviewed: Current active prescription include oxycodone 5 mg tablet, 15 tablets was written and filled on 09/08/2023; gabapentin 100 mg capsule, 60 capsules was written and filled on 10/18/2023.

## 2023-12-12 NOTE — Assessment & Plan Note (Signed)
-   Hydralazine 5 mg IV every 6 hours as needed for SBP greater than 175, 5 days ordered ?

## 2023-12-12 NOTE — Assessment & Plan Note (Signed)
-   As needed nicotine patch ordered ?

## 2023-12-12 NOTE — ED Notes (Signed)
Patient requesting more pain medications; MD Scotty Court made aware.

## 2023-12-13 ENCOUNTER — Inpatient Hospital Stay (HOSPITAL_COMMUNITY)
Admit: 2023-12-13 | Discharge: 2023-12-13 | Disposition: A | Discharge status: Home / Self Care | Payer: Medicare HMO | Attending: Internal Medicine | Admitting: Internal Medicine

## 2023-12-13 DIAGNOSIS — K7030 Alcoholic cirrhosis of liver without ascites: Secondary | ICD-10-CM | POA: Diagnosis not present

## 2023-12-13 DIAGNOSIS — F1090 Alcohol use, unspecified, uncomplicated: Secondary | ICD-10-CM | POA: Diagnosis not present

## 2023-12-13 DIAGNOSIS — E271 Primary adrenocortical insufficiency: Secondary | ICD-10-CM

## 2023-12-13 DIAGNOSIS — E876 Hypokalemia: Secondary | ICD-10-CM

## 2023-12-13 DIAGNOSIS — R7989 Other specified abnormal findings of blood chemistry: Secondary | ICD-10-CM | POA: Diagnosis not present

## 2023-12-13 DIAGNOSIS — F1429 Cocaine dependence with unspecified cocaine-induced disorder: Secondary | ICD-10-CM | POA: Diagnosis not present

## 2023-12-13 DIAGNOSIS — I8511 Secondary esophageal varices with bleeding: Secondary | ICD-10-CM

## 2023-12-13 DIAGNOSIS — E785 Hyperlipidemia, unspecified: Secondary | ICD-10-CM | POA: Diagnosis not present

## 2023-12-13 DIAGNOSIS — R079 Chest pain, unspecified: Secondary | ICD-10-CM

## 2023-12-13 DIAGNOSIS — I214 Non-ST elevation (NSTEMI) myocardial infarction: Secondary | ICD-10-CM | POA: Diagnosis not present

## 2023-12-13 DIAGNOSIS — F1910 Other psychoactive substance abuse, uncomplicated: Secondary | ICD-10-CM

## 2023-12-13 LAB — BASIC METABOLIC PANEL
Anion gap: 7 (ref 5–15)
BUN: 10 mg/dL (ref 6–20)
CO2: 22 mmol/L (ref 22–32)
Calcium: 8.1 mg/dL — ABNORMAL LOW (ref 8.9–10.3)
Chloride: 107 mmol/L (ref 98–111)
Creatinine, Ser: 0.64 mg/dL (ref 0.61–1.24)
GFR, Estimated: >60 mL/min (ref >60)
Glucose, Bld: 103 mg/dL — ABNORMAL HIGH (ref 70–99)
Potassium: 3.8 mmol/L (ref 3.5–5.1)
Sodium: 136 mmol/L (ref 135–145)

## 2023-12-13 LAB — CBC
HCT: 27.4 % — ABNORMAL LOW (ref 39.0–52.0)
HCT: 26.5 % — ABNORMAL LOW (ref 39.0–52.0)
Hemoglobin: 8.9 g/dL — ABNORMAL LOW (ref 13.0–17.0)
Hemoglobin: 8.7 g/dL — ABNORMAL LOW (ref 13.0–17.0)
MCH: 30.9 pg (ref 26.0–34.0)
MCH: 31.8 pg (ref 26.0–34.0)
MCHC: 32.5 g/dL (ref 30.0–36.0)
MCHC: 32.8 g/dL (ref 30.0–36.0)
MCV: 95.1 fL (ref 80.0–100.0)
MCV: 96.7 fL (ref 80.0–100.0)
Platelets: 101 10*3/uL — ABNORMAL LOW (ref 150–400)
Platelets: 111 10*3/uL — ABNORMAL LOW (ref 150–400)
RBC: 2.88 MIL/uL — ABNORMAL LOW (ref 4.22–5.81)
RBC: 2.74 MIL/uL — ABNORMAL LOW (ref 4.22–5.81)
RDW: 16.8 % — ABNORMAL HIGH (ref 11.5–15.5)
RDW: 16.5 % — ABNORMAL HIGH (ref 11.5–15.5)
WBC: 4.8 10*3/uL (ref 4.0–10.5)
WBC: 5.6 10*3/uL (ref 4.0–10.5)
nRBC: 0 % (ref 0.0–0.2)
nRBC: 0 % (ref 0.0–0.2)

## 2023-12-13 LAB — ECHOCARDIOGRAM COMPLETE
AR max vel: 2.84 cm2
AV Peak grad: 7.3 mm[Hg]
Ao pk vel: 1.35 m/s
Area-P 1/2: 3.91 cm2
Height: 65 in
S' Lateral: 4.3 cm
Weight: 3056 [oz_av]

## 2023-12-13 LAB — HEPARIN LEVEL (UNFRACTIONATED)
Heparin Unfractionated: 0.37 [IU]/mL (ref 0.30–0.70)
Heparin Unfractionated: 0.27 [IU]/mL — ABNORMAL LOW (ref 0.30–0.70)
Heparin Unfractionated: 0.43 [IU]/mL (ref 0.30–0.70)

## 2023-12-13 MED ORDER — ISOSORBIDE MONONITRATE ER 30 MG PO TB24
15.0000 mg | ORAL_TABLET | Freq: Every day | ORAL | Status: DC
  Administered 2023-12-13 – 2023-12-14 (×2): 15 mg via ORAL
  Filled 2023-12-13 (×2): qty 1

## 2023-12-13 MED ORDER — DOCUSATE SODIUM 100 MG PO CAPS
100.0000 mg | ORAL_CAPSULE | Freq: Two times a day (BID) | ORAL | Status: DC | PRN
  Administered 2023-12-16: 100 mg via ORAL
  Filled 2023-12-13: qty 1

## 2023-12-13 MED ORDER — ROSUVASTATIN CALCIUM 10 MG PO TABS
40.0000 mg | ORAL_TABLET | Freq: Every day | ORAL | Status: DC
  Administered 2023-12-13 – 2023-12-18 (×6): 40 mg via ORAL
  Filled 2023-12-13 (×4): qty 4
  Filled 2023-12-13 (×2): qty 2
  Filled 2023-12-13: qty 4

## 2023-12-13 MED ORDER — MORPHINE SULFATE (PF) 2 MG/ML IV SOLN
1.0000 mg | INTRAVENOUS | Status: DC | PRN
  Administered 2023-12-13 – 2023-12-15 (×11): 1 mg via INTRAVENOUS
  Filled 2023-12-13 (×11): qty 1

## 2023-12-13 MED ORDER — METHOCARBAMOL 500 MG PO TABS
500.0000 mg | ORAL_TABLET | Freq: Three times a day (TID) | ORAL | Status: DC | PRN
  Administered 2023-12-13 – 2023-12-17 (×2): 500 mg via ORAL
  Filled 2023-12-13 (×2): qty 1

## 2023-12-13 MED ORDER — ORAL CARE MOUTH RINSE: 15.0000 mL | OROMUCOSAL | Status: DC | PRN

## 2023-12-13 MED ORDER — DIPHENHYDRAMINE HCL 25 MG PO CAPS
25.0000 mg | ORAL_CAPSULE | Freq: Four times a day (QID) | ORAL | Status: DC | PRN
  Administered 2023-12-13: 25 mg via ORAL
  Filled 2023-12-13: qty 1

## 2023-12-13 NOTE — Progress Notes (Signed)
PHARMACY - ANTICOAGULATION CONSULT NOTE  Pharmacy Consult for Heparin Infusion Indication: chest pain/ACS  Allergies  Allergen Reactions   Penicillins Hives and Rash    Tolerated Ceftriaxone 10/2022    Patient Measurements: Height: 5\' 5"  (165.1 cm) Weight: 86.6 kg (191 lb) IBW/kg (Calculated) : 61.5 Heparin Dosing Weight: 79.8 kg  Vital Signs: Temp: 98.1 F (36.7 C) (12/28 2045) Temp Source: Oral (12/28 1223) BP: 74/36 (12/28 2045) Pulse Rate: 82 (12/28 2045)  Labs: Recent Labs    12/11/23 2228 12/12/23 0900 12/12/23 1035 12/12/23 1254 12/12/23 1257 12/12/23 1454 12/12/23 1941 12/12/23 1941 12/13/23 0403 12/13/23 1213 12/13/23 2026  HGB 10.5* 9.7*  --   --   --   --   --   --  8.9* 8.7*  --   HCT 32.8* 29.2*  --   --   --   --   --   --  27.4* 26.5*  --   PLT 114* 97*  --   --   --   --   --   --  101* 111*  --   APTT  --   --   --   --  37*  --   --   --   --   --   --   LABPROT  --   --   --  18.4*  --   --   --   --   --   --   --   INR  --   --   --  1.5*  --   --   --   --   --   --   --   HEPARINUNFRC  --   --   --   --   --   --  0.17*   < > 0.37 0.27* 0.43  CREATININE 0.59* 0.62  --   --   --   --   --   --  0.64  --   --   TROPONINIHS 14 256* 270*  --   --  313* 287*  --   --   --   --    < > = values in this interval not displayed.    Estimated Creatinine Clearance: 110.5 mL/min (by C-G formula based on SCr of 0.64 mg/dL).   Medical History: Past Medical History:  Diagnosis Date   (HFpEF) heart failure with preserved ejection fraction (HCC)    Addison disease (HCC)    CAD (coronary artery disease)    a. 01/2011 Anterior STEMI/Cath/PCI: LM nl, LAD 100d (3.5x10mm Vision BMS placed), LCX 66m, RI 50, RCA min irregs, EF 40% w/ apical, inferoapical HK; b. 10/2022 NSTSEMI/Cath: LM nl, LAD 5p/m, RI 85, LCX 135m, OM1 mild dzs, OM3 100 fills via L->L collats from dLAD, RCA 80p, 151m, RPDA fills via collats from LAD. EF 45-50%-->Med Rx.   CAD, multiple vessel     Cardiac arrest - ventricular fibrillation    a. In setting of STEMI 01/2011   Chronic, continuous use of opioids    Cocaine abuse (HCC)    COPD (chronic obstructive pulmonary disease) (HCC)    Depression with anxiety    ETOH abuse    a. 6-12 beers/day   GERD (gastroesophageal reflux disease)    GI bleed    Hemorrhoids    Hyperlipidemia    Hypertension    Ischemic cardiomyopathy    a. 06/2011 Echo: EF 45-50%, No rwma; b. 10/2022 Echo: EF 55-60%, no rwma,  nl RV fxn.   Liver cirrhosis, alcoholic (HCC)    Marijuana abuse    Migraines    Noncompliance with immunization regimen    Obesity (BMI 30.0-34.9)    Orthostatic hypertension    midodrine   Portal hypertension (HCC)    s/p TIPS   STEMI (ST elevation myocardial infarction) (HCC) 2012   Tobacco abuse    a. 1/2 ppd x 26 yrs   Medications:  Not on anticoagulation prior to admission  Assessment: Patient is a 52 year old male with a past medical history of liver cirrhosis s/p TIPS, superior mesenteric artery stenosis s/p stenting, CAD, STEMI s/p PCI with stenting, HFpEF, ischemic cardiomyopathy, prior V-fib cardiac arrest, hypertension, hyperlipidemia, GERD, migraine headaches polysubstance abuse (cocaine, alcohol, marijuana and tobacco), orthostatic hypotension, Addison's/adrenal insufficiency, chronic narcotic dependence, and recurrent GI bleeds who presented with chest pain. Troponin 256>270. Pharmacy was consulted to initiate patient on a heparin infusion for ACS/STEMI. Patient was not on anticoagulation at home.   12/27 1941 HL 0.17 subtherapeutic on 1000 units/hr 12/28 0403 HL 0.37, therapeutic x 1 / Hgb 9.7 >> 8.9 12/28 1213 HL 0.27, subtherapeutic    Hgb 8.9>8.7 12/28 2026 HL 0.43, therapeutic x1  Goal of Therapy:  Heparin level 0.3-0.7 units/ml Monitor platelets by anticoagulation protocol: Yes   Plan:  Will not give any boluses per MD given patient's history of recurrent GI bleeds Continue heparin infusion at 1450  units/hr Check HL in AM to confirm Continue to monitor CBC daily while on heparin   Thank you for involving pharmacy in this patient's care.   Rockwell Alexandria, PharmD Clinical Pharmacist 12/13/2023 9:18 PM

## 2023-12-13 NOTE — Progress Notes (Signed)
PHARMACY - ANTICOAGULATION CONSULT NOTE  Pharmacy Consult for Heparin Infusion Indication: chest pain/ACS  Allergies  Allergen Reactions   Penicillins Hives and Rash    Tolerated Ceftriaxone 10/2022    Patient Measurements: Height: 5\' 5"  (165.1 cm) Weight: 86.6 kg (191 lb) IBW/kg (Calculated) : 61.5 Heparin Dosing Weight: 79.8 kg  Vital Signs: Temp: 98.4 F (36.9 C) (12/27 2130) Temp Source: Oral (12/27 2130) BP: 113/57 (12/28 0402) Pulse Rate: 79 (12/28 0402)  Labs: Recent Labs    12/11/23 2228 12/12/23 0900 12/12/23 1035 12/12/23 1254 12/12/23 1257 12/12/23 1454 12/12/23 1941 12/13/23 0403  HGB 10.5* 9.7*  --   --   --   --   --  8.9*  HCT 32.8* 29.2*  --   --   --   --   --  27.4*  PLT 114* 97*  --   --   --   --   --  101*  APTT  --   --   --   --  37*  --   --   --   LABPROT  --   --   --  18.4*  --   --   --   --   INR  --   --   --  1.5*  --   --   --   --   HEPARINUNFRC  --   --   --   --   --   --  0.17* 0.37  CREATININE 0.59* 0.62  --   --   --   --   --  0.64  TROPONINIHS 14 256* 270*  --   --  313* 287*  --     Estimated Creatinine Clearance: 110.5 mL/min (by C-G formula based on SCr of 0.64 mg/dL).   Medical History: Past Medical History:  Diagnosis Date   (HFpEF) heart failure with preserved ejection fraction (HCC)    Addison disease (HCC)    CAD (coronary artery disease)    a. 01/2011 Anterior STEMI/Cath/PCI: LM nl, LAD 100d (3.5x3mm Vision BMS placed), LCX 46m, RI 50, RCA min irregs, EF 40% w/ apical, inferoapical HK; b. 10/2022 NSTSEMI/Cath: LM nl, LAD 5p/m, RI 85, LCX 158m, OM1 mild dzs, OM3 100 fills via L->L collats from dLAD, RCA 80p, 186m, RPDA fills via collats from LAD. EF 45-50%-->Med Rx.   CAD, multiple vessel    Cardiac arrest - ventricular fibrillation    a. In setting of STEMI 01/2011   Chronic, continuous use of opioids    Cocaine abuse (HCC)    COPD (chronic obstructive pulmonary disease) (HCC)    Depression with anxiety     ETOH abuse    a. 6-12 beers/day   GERD (gastroesophageal reflux disease)    GI bleed    Hemorrhoids    Hyperlipidemia    Hypertension    Ischemic cardiomyopathy    a. 06/2011 Echo: EF 45-50%, No rwma; b. 10/2022 Echo: EF 55-60%, no rwma, nl RV fxn.   Liver cirrhosis, alcoholic (HCC)    Marijuana abuse    Migraines    Noncompliance with immunization regimen    Obesity (BMI 30.0-34.9)    Orthostatic hypertension    midodrine   Portal hypertension (HCC)    s/p TIPS   STEMI (ST elevation myocardial infarction) (HCC) 2012   Tobacco abuse    a. 1/2 ppd x 26 yrs   Medications:  Not on anticoagulation prior to admission  Assessment: Patient is a 52 year  old male with a past medical history of liver cirrhosis s/p TIPS, superior mesenteric artery stenosis s/p stenting, CAD, STEMI s/p PCI with stenting, HFpEF, ischemic cardiomyopathy, prior V-fib cardiac arrest, hypertension, hyperlipidemia, GERD, migraine headaches polysubstance abuse (cocaine, alcohol, marijuana and tobacco), orthostatic hypotension, Addison's/adrenal insufficiency, chronic narcotic dependence, and recurrent GI bleeds who presented with chest pain. Troponin 256>270. Pharmacy was consulted to initiate patient on a heparin infusion for ACS/STEMI. Patient was not on anticoagulation at home.   12/27 1941 HL 0.17 subtherapeutic on 1000 units/hr 12/28 0403 HL 0.37, therapeutic x 1 / Hgb 9.7 >> 8.9  Goal of Therapy:  Heparin level 0.3-0.7 units/ml Monitor platelets by anticoagulation protocol: Yes   Plan:  Will not give any boluses per MD given patient's history of recurrent GI bleeds Continue heparin infusion to 1300 units/hr Check HL in 6 hours to confirm Will recheck CBC in 6 hrs d/t Hgb trending down  Continue to monitor CBC daily while on heparin   Thank you for involving pharmacy in this patient's care.   Otelia Sergeant, PharmD, [facility] 12/13/2023 4:55 AM

## 2023-12-13 NOTE — Progress Notes (Signed)
PHARMACY - ANTICOAGULATION CONSULT NOTE  Pharmacy Consult for Heparin Infusion Indication: chest pain/ACS  Allergies  Allergen Reactions   Penicillins Hives and Rash    Tolerated Ceftriaxone 10/2022    Patient Measurements: Height: 5\' 5"  (165.1 cm) Weight: 86.6 kg (191 lb) IBW/kg (Calculated) : 61.5 Heparin Dosing Weight: 79.8 kg  Vital Signs: Temp: 98.1 F (36.7 C) (12/28 1223) Temp Source: Oral (12/28 1223) BP: 118/67 (12/28 1223) Pulse Rate: 86 (12/28 1223)  Labs: Recent Labs    12/11/23 2228 12/12/23 0900 12/12/23 1035 12/12/23 1254 12/12/23 1257 12/12/23 1454 12/12/23 1941 12/13/23 0403 12/13/23 1213  HGB 10.5* 9.7*  --   --   --   --   --  8.9* 8.7*  HCT 32.8* 29.2*  --   --   --   --   --  27.4* 26.5*  PLT 114* 97*  --   --   --   --   --  101* 111*  APTT  --   --   --   --  37*  --   --   --   --   LABPROT  --   --   --  18.4*  --   --   --   --   --   INR  --   --   --  1.5*  --   --   --   --   --   HEPARINUNFRC  --   --   --   --   --   --  0.17* 0.37 0.27*  CREATININE 0.59* 0.62  --   --   --   --   --  0.64  --   TROPONINIHS 14 256* 270*  --   --  313* 287*  --   --     Estimated Creatinine Clearance: 110.5 mL/min (by C-G formula based on SCr of 0.64 mg/dL).   Medical History: Past Medical History:  Diagnosis Date   (HFpEF) heart failure with preserved ejection fraction (HCC)    Addison disease (HCC)    CAD (coronary artery disease)    a. 01/2011 Anterior STEMI/Cath/PCI: LM nl, LAD 100d (3.5x58mm Vision BMS placed), LCX 93m, RI 50, RCA min irregs, EF 40% w/ apical, inferoapical HK; b. 10/2022 NSTSEMI/Cath: LM nl, LAD 5p/m, RI 85, LCX 167m, OM1 mild dzs, OM3 100 fills via L->L collats from dLAD, RCA 80p, 173m, RPDA fills via collats from LAD. EF 45-50%-->Med Rx.   CAD, multiple vessel    Cardiac arrest - ventricular fibrillation    a. In setting of STEMI 01/2011   Chronic, continuous use of opioids    Cocaine abuse (HCC)    COPD (chronic  obstructive pulmonary disease) (HCC)    Depression with anxiety    ETOH abuse    a. 6-12 beers/day   GERD (gastroesophageal reflux disease)    GI bleed    Hemorrhoids    Hyperlipidemia    Hypertension    Ischemic cardiomyopathy    a. 06/2011 Echo: EF 45-50%, No rwma; b. 10/2022 Echo: EF 55-60%, no rwma, nl RV fxn.   Liver cirrhosis, alcoholic (HCC)    Marijuana abuse    Migraines    Noncompliance with immunization regimen    Obesity (BMI 30.0-34.9)    Orthostatic hypertension    midodrine   Portal hypertension (HCC)    s/p TIPS   STEMI (ST elevation myocardial infarction) (HCC) 2012   Tobacco abuse    a. 1/2  ppd x 26 yrs   Medications:  Not on anticoagulation prior to admission  Assessment: Patient is a 52 year old male with a past medical history of liver cirrhosis s/p TIPS, superior mesenteric artery stenosis s/p stenting, CAD, STEMI s/p PCI with stenting, HFpEF, ischemic cardiomyopathy, prior V-fib cardiac arrest, hypertension, hyperlipidemia, GERD, migraine headaches polysubstance abuse (cocaine, alcohol, marijuana and tobacco), orthostatic hypotension, Addison's/adrenal insufficiency, chronic narcotic dependence, and recurrent GI bleeds who presented with chest pain. Troponin 256>270. Pharmacy was consulted to initiate patient on a heparin infusion for ACS/STEMI. Patient was not on anticoagulation at home.   12/27 1941 HL 0.17 subtherapeutic on 1000 units/hr 12/28 0403 HL 0.37, therapeutic x 1 / Hgb 9.7 >> 8.9 12/28 1213 HL 0.27, subtherapeutic    Hgb 8.9>8.7  Goal of Therapy:  Heparin level 0.3-0.7 units/ml Monitor platelets by anticoagulation protocol: Yes   Plan:  Will not give any boluses per MD given patient's history of recurrent GI bleeds increase heparin infusion to 1450 units/hr Check HL in 6 hours to confirm Continue to monitor CBC daily while on heparin   Thank you for involving pharmacy in this patient's care.   Bari Mantis PharmD Clinical  Pharmacist 12/13/2023

## 2023-12-13 NOTE — Progress Notes (Signed)
Progress Note   [name] ZOX:[id number] DOB: November 18, 1971 DOA: 12/12/2023     1 DOS: the patient was seen and examined on 12/13/2023   Brief hospital course: Ethan Wells is a 52 y.o. male with a hx of coronary artery disease with prior STEMI and VF arrest in 01/2011 with LAD stenting, NSTEMI in 10/2022, ischemic cardiomyopathy, cirrhosis status post TIPS, polysubstance abuse, recurrent GI bleeding, hypertension, hyperlipidemia, mesenteric disease status post SMA stenting, presented for for the evaluation of chest pain   Assessment and Plan: * NSTEMI (non-ST elevated myocardial infarction) (HCC) H/o of CAD status post PCI to LAD, hyperlipidemia, hypertension Continue heparin per pharmacy protocol. Complete echo pending. Troponin trending down. Repeat high since he troponin to ensure stabilization versus downtrending UDS + for cocaine, no beta blocker. Can't give Nitrates due to low BP. Cardiology evaluation appreciated. Continue Statin. Not on aspirin due to GI bleed. Further management pending echo.  Polysubstance abuse (HCC) UDS positive for cocaine. Advised to quit illicit drug. Smoking cessation advised. Advised to strop drinking alcohol.  Tobacco abuse Nicotine patch ordered  Chronic Hypotension BP lower side Caution with antihypertensives.  COPD (chronic obstructive pulmonary disease) (HCC) Does not appear to be in acute exacerbation DuoNebs every 6 hours as needed for wheezing, shortness of breath, 4 days ordered  Chronic diastolic CHF Stable. Last Echo 2/24 EF 50%. Will resume Lasix if BP better. Monitor daily wrights, strict ins and output.  Hyperbilirubinemia Appears stable over the last 2 months  Hypokalemia- Improved.  Chronic Anemia- Stable. Monitor for bleeding as he is on heparin drip.    Out of bed to chair. Incentive spirometry. Nursing supportive care. Fall, aspiration precautions. DVT prophylaxis   Code Status:  Full Code  Subjective: Patient is seen and examined today morning. States on and off chest pain radiating to left shoulder with shortness of breath, diaphoresis. Has itching, body aches. Feels weak.   Physical Exam: Vitals:   12/13/23 0845 12/13/23 0854 12/13/23 1222 12/13/23 1223  BP:   118/67 118/67  Pulse: 87  81 86  Resp: 11   19  Temp:  97.9 F (36.6 C)  98.1 F (36.7 C)  TempSrc:  Oral  Oral  SpO2: 95%  95% 97%  Weight:      Height:        General - Middle aged Caucasian obese male, mild distress due to pain HEENT - PERRLA, EOMI, atraumatic head, non tender sinuses. Lung - Clear, basal rales, rhonchi, no wheezes. Heart - S1, S2 heard, no murmurs, rubs, trace pedal edema. Abdomen - Soft, non tender, obese, bowel sounds good Neuro - Alert, awake and oriented x 3, non focal exam. Skin - Warm and dry.  Data Reviewed:      Latest Ref Rng & Units 12/13/2023   12:13 PM 12/13/2023    4:03 AM 12/12/2023    9:00 AM  CBC  WBC 4.0 - 10.5 K/uL 5.6  4.8  4.5   Hemoglobin 13.0 - 17.0 g/dL 8.7  8.9  9.7   Hematocrit 39.0 - 52.0 % 26.5  27.4  29.2   Platelets 150 - 400 K/uL 111  101  97       Latest Ref Rng & Units 12/13/2023    4:03 AM 12/12/2023    9:00 AM 12/11/2023   10:28 PM  BMP  Glucose 70 - 99 mg/dL 811  914  782   BUN 6 - 20 mg/dL 10  8  6  Creatinine 0.61 - 1.24 mg/dL 4.09  8.11  9.14   Sodium 135 - 145 mmol/L 136  137  137   Potassium 3.5 - 5.1 mmol/L 3.8  3.4  3.4   Chloride 98 - 111 mmol/L 107  105  106   CO2 22 - 32 mmol/L 22  20  16    Calcium 8.9 - 10.3 mg/dL 8.1  8.7  8.8    CT Angio Chest PE W and/or Wo Contrast Result Date: 12/12/2023 CLINICAL DATA:  Pulmonary embolism (PE) suspected, high prob. Chest pain. EXAM: CT ANGIOGRAPHY CHEST WITH CONTRAST TECHNIQUE: Multidetector CT imaging of the chest was performed using the standard protocol during bolus administration of intravenous contrast. Multiplanar CT image reconstructions and MIPs were obtained to  evaluate the vascular anatomy. RADIATION DOSE REDUCTION: This exam was performed according to the departmental dose-optimization program which includes automated exposure control, adjustment of the mA and/or kV according to patient size and/or use of iterative reconstruction technique. CONTRAST:  75mL OMNIPAQUE IOHEXOL 350 MG/ML SOLN COMPARISON:  CTA chest, abdomen, pelvis dated November 14, 2023. FINDINGS: Cardiovascular: Satisfactory opacification of the pulmonary arteries to the segmental level. No evidence of pulmonary embolism. There is dilation of the main pulmonary artery measuring up to 3.7 cm in diameter, which can be seen in the setting of pulmonary arterial hypertension. The heart is enlarged, unchanged. No pericardial effusion. The thoracic aorta is normal in caliber. Atherosclerotic calcification of the thoracic aorta and coronary arteries. Mediastinum/Nodes: No enlarged mediastinal, hilar, or axillary lymph nodes. Adherent secretions within the trachea. Thyroid gland and esophagus demonstrate no significant findings. Lungs/Pleura: No focal consolidation, pleural effusion, or pneumothorax. Unchanged 5 mm right middle lobe nodule (series 5, image 84). No specific follow-up imaging recommended. Upper Abdomen: Partially imaged tips and celiac artery and SMA stent. Similar cirrhotic morphology of the liver. Musculoskeletal: No acute or significant osseous findings. Similar defect at the superolateral left humeral head, may reflect sequela of prior dislocation. Review of the MIP images confirms the above findings. IMPRESSION: 1. No evidence of pulmonary embolism. 2. No acute intrathoracic findings. 3. Dilation of the main pulmonary artery, which can be seen in the setting of pulmonary arterial hypertension. 4. Cardiomegaly. 5.  Aortic Atherosclerosis (ICD10-I70.0). Electronically Signed   By: Hart Robinsons M.D.   On: 12/12/2023 11:40   DG Chest 2 View Result Date: 12/12/2023 CLINICAL DATA:  Chest  pain. EXAM: CHEST - 2 VIEW COMPARISON:  Chest radiograph dated November 24, 2023. FINDINGS: The heart size and mediastinal contours are unchanged. No focal consolidation, pleural effusion, or pneumothorax. No acute osseous abnormality. IMPRESSION: No acute cardiopulmonary findings. Electronically Signed   By: Hart Robinsons M.D.   On: 12/12/2023 09:44    Family Communication: Discussed with patient he understand and agree. All questions answereed.  Disposition: Status is: Inpatient Remains inpatient appropriate because: chest pain work up  Planned Discharge Destination: Home with Home Health     Time spent: 40 minutes  Author: Marcelino Duster, MD 12/13/2023 12:25 PM Secure chat 7am to 7pm For on call review [email or web address].

## 2023-12-13 NOTE — Progress Notes (Signed)
Echocardiogram 2D Echocardiogram has been performed.  Ethan Wells 12/13/2023, 3:18 PM

## 2023-12-13 NOTE — Consult Note (Signed)
Cardiology Consultation   Patient ID: Ethan Wells MRN: [id number]; DOB: 09/14/1971  Admit date: 12/12/2023 Date of Consult: 12/13/2023  PCP:  Aviva Kluver   [facility] Providers Cardiologist:  Verne Carrow, MD        [name] is a 52 y.o. male with a hx of coronary artery disease with prior STEMI and VF arrest in 01/2011 with LAD stenting, NSTEMI in 10/2022, ischemic cardiomyopathy, cirrhosis status post TIPS, polysubstance abuse, recurrent GI bleeding, hypertension, hyperlipidemia, mesenteric disease status post SMA stenting, who is being seen 12/13/2023 for the evaluation of NSTEMI at the request of Dr. Clide Dales.  History of Present Illness:   Mr. Ethan Wells suffered an anterior STEMI with V-fib arrest in 2012 and underwent LAD PCI.  He was admitted in 10/2022 with an NSTEMI and rectal bleeding/anemia.  Cardiac catheterization revealed LAD stent with occluded left circumflex and RCA with left to left and left to right collaterals.  He was medically managed and required ligation of his hemorrhoids x 2 during admission.  He was diuresed and DC'd at 199 pounds.  He was readmitted to [facility] on 11/19/2022 due to recurrent rectal bleeding and chest/back pain.  High-sensitivity troponins were normal but urine drug screen was positive for cocaine.  He was conservatively managed from a cardiac standpoint and had Imdur added to his medication regimen.  H&H remained stable during his admission.  He presented to [facility] on 01/16/2023 was found to be hypotensive with a blood pressure of 83/52 and a hemoglobin of 6.3.  Potassium was also noted to be 2 p.o. 8 with a lipase of 78, AST 99, ALT of 24, and alkaline phos of 271.  ECG without acute ST/T wave changes.  Chest x-ray without acute disease.  CTA GIB study with findings suspicious for small active rectal bleed, severe stenosis in the celiac, colitis versus congestive cold pathway, duodenitis, and cirrhosis with  borderline splenomegaly with portal hypertension.  He was placed on norepinephrine and admitted to the ICU.  Beta-blocker was held in the setting of hypotension aspirin held in setting of anemia.  Patient was not a good candidate for cardiac catheterization given ongoing bleeding.  He presented back to [facility] on 03/01/2023 for scheduled EGD procedure.  He was found to have Barrett's esophagus, esophageal varices.  Postop the patient complained of chest pain similar to prior heart attack.  Cardiology was asked to see.  EKG showed no changes patient was given pain medicines with improvement of chest pain and he was planned for hospital admission for further workup.  He was then admitted to [facility] on 11/23/2023 for the evaluation of an NSTEMI in the setting of cocaine use.  High-sensitivity troponin was notable at 33/48/32.  Alcohol level the a.m. was still elevated at 162.  Drug screen positive for cocaine otherwise negative.  ECG normal sinus rhythm with diffuse nonspecific ST-T wave abnormalities.  Patient presented to the [facility] emergency room 12/01/2023 from home via EMS reported making a sandwich he cannot stand up anymore he had a history of neuropathy and takes gabapentin he complaining of pain going down his left leg.  He was not ambulating with the EMS at that time.  Patient was evaluated and was able to be discharged.  He presented back to the emergency department 12 hours later via EMS stating that he was having chest pain.  Patient had left AMA.  He returned to the [facility] emergency department on 12/12/2023 with complaints of chest  pain.  EMS had given him aspirin 324 mg and 1 inch of Nitropaste.  Patient states he has had chest pain that has been ongoing since yesterday.  He describes the pain as a sharp stabbing type pain.  States there is radiation from the right side of his chest to the left side of his chest without any radiation down his arms.  Denies any aggravating or alleviating factors.Had some mild  shortness of breath as well.  He stated that he has been under an increased amount of stress as he is having to care for his mother at home.  He says that the discomfort that he had had woken him from sleep.  He denies any swelling to his bilateral lower extremities or abdominal bloating which he normally has on occasion.  Denies any palpitations.  Denies any lightheaded or dizziness or syncopal /near syncopal episodes.  Stated that he has not used cocaine in the last several weeks but urine drug screen from 12/26 was positive for cocaine.  States that he did have alcohol yesterday without levels evaluated today.  Initial vital signs: Blood pressure 102/47, pulse 106, respirations of 20, temperature 98.2.  Pertinent labs revealed hemoglobin of 9.7, hematocrit of 29.2, platelets of 97, potassium 3.4, CO2 of 20, blood glucose 131, calcium of 8.7, AST of 56, alkaline phos 165, total bilirubin of 3, magnesium of 1.5, high-sensitivity troponin of 256 and 270, UDS completed on 12/11/2023 continue to be positive for cocaine no repeat was done on arrival at this time, respiratory panel negative for COVID/RSV/flu A and B  Imaging: Chest x-ray revealed no acute cardiopulmonary findings; CTA of the chest revealed no evidence of pulmonary embolism, no acute intrathoracic findings, dilatation of the main pulmonary artery which can be seen in the setting of pulmonary arterial hypertension, cardiomegaly, and aortic atherosclerosis  Medications administered in the emergency department: Heparin bolus and infusion started, Protonix 40 mg IVP, Toradol 15 mg IVP, 500 mL of normal saline, morphine 4 mg IVP, and Zofran 4 mg IVP  Cardiology was consulted for concerns of NSTEMI  Past Medical History:  Diagnosis Date   (HFpEF) heart failure with preserved ejection fraction (HCC)    Addison disease (HCC)    CAD (coronary artery disease)    a. 01/2011 Anterior STEMI/Cath/PCI: LM nl, LAD 100d (3.5x24mm Vision BMS placed), LCX  45m, RI 50, RCA min irregs, EF 40% w/ apical, inferoapical HK; b. 10/2022 NSTSEMI/Cath: LM nl, LAD 5p/m, RI 85, LCX 176m, OM1 mild dzs, OM3 100 fills via L->L collats from dLAD, RCA 80p, 166m, RPDA fills via collats from LAD. EF 45-50%-->Med Rx.   CAD, multiple vessel    Cardiac arrest - ventricular fibrillation    a. In setting of STEMI 01/2011   Chronic, continuous use of opioids    Cocaine abuse (HCC)    COPD (chronic obstructive pulmonary disease) (HCC)    Depression with anxiety    ETOH abuse    a. 6-12 beers/day   GERD (gastroesophageal reflux disease)    GI bleed    Hemorrhoids    Hyperlipidemia    Hypertension    Ischemic cardiomyopathy    a. 06/2011 Echo: EF 45-50%, No rwma; b. 10/2022 Echo: EF 55-60%, no rwma, nl RV fxn.   Liver cirrhosis, alcoholic (HCC)    Marijuana abuse    Migraines    Noncompliance with immunization regimen    Obesity (BMI 30.0-34.9)    Orthostatic hypertension    midodrine   Portal hypertension (HCC)  s/p TIPS   STEMI (ST elevation myocardial infarction) (HCC) 2012   Tobacco abuse    a. 1/2 ppd x 26 yrs    Past Surgical History:  Procedure Laterality Date   COLONOSCOPY  08/09/2021   Procedure: COLONOSCOPY;  Surgeon: Midge Minium, MD;  Location: [facility] ENDOSCOPY;  Service: Endoscopy;;   CORONARY ANGIOPLASTY WITH STENT PLACEMENT     ESOPHAGEAL BANDING  04/08/2023   Procedure: ESOPHAGEAL BANDING;  Surgeon: Tressia Danas, MD;  Location: [facility] ENDOSCOPY;  Service: Gastroenterology;;   ESOPHAGOGASTRODUODENOSCOPY N/A 05/30/2022   Procedure: ESOPHAGOGASTRODUODENOSCOPY (EGD);  Surgeon: Toney Reil, MD;  Location: [facility] ENDOSCOPY;  Service: Gastroenterology;  Laterality: N/A;   ESOPHAGOGASTRODUODENOSCOPY (EGD) WITH PROPOFOL N/A 03/01/2021   Procedure: ESOPHAGOGASTRODUODENOSCOPY (EGD) WITH PROPOFOL;  Surgeon: Toney Reil, MD;  Location: [facility] SURGERY CNTR;  Service: Endoscopy;  Laterality: N/A;   ESOPHAGOGASTRODUODENOSCOPY (EGD) WITH PROPOFOL  N/A 08/09/2021   Procedure: ESOPHAGOGASTRODUODENOSCOPY (EGD) WITH PROPOFOL;  Surgeon: Midge Minium, MD;  Location: [facility] ENDOSCOPY;  Service: Endoscopy;  Laterality: N/A;   ESOPHAGOGASTRODUODENOSCOPY (EGD) WITH PROPOFOL N/A 03/01/2023   Procedure: ESOPHAGOGASTRODUODENOSCOPY (EGD) WITH PROPOFOL;  Surgeon: Jaynie Collins, DO;  Location: [facility] ENDOSCOPY;  Service: Gastroenterology;  Laterality: N/A;   ESOPHAGOGASTRODUODENOSCOPY (EGD) WITH PROPOFOL N/A 04/08/2023   Procedure: ESOPHAGOGASTRODUODENOSCOPY (EGD) WITH PROPOFOL;  Surgeon: Tressia Danas, MD;  Location: [facility] ENDOSCOPY;  Service: Gastroenterology;  Laterality: N/A;   ESOPHAGOGASTRODUODENOSCOPY (EGD) WITH PROPOFOL N/A 08/09/2023   Procedure: ESOPHAGOGASTRODUODENOSCOPY (EGD) WITH PROPOFOL;  Surgeon: Regis Bill, MD;  Location: [facility] ENDOSCOPY;  Service: Endoscopy;  Laterality: N/A;   ESOPHAGOGASTRODUODENOSCOPY (EGD) WITH PROPOFOL N/A 08/19/2023   Procedure: ESOPHAGOGASTRODUODENOSCOPY (EGD) WITH PROPOFOL;  Surgeon: Regis Bill, MD;  Location: [facility] ENDOSCOPY;  Service: Endoscopy;  Laterality: N/A;   EVALUATION UNDER ANESTHESIA WITH HEMORRHOIDECTOMY N/A 10/31/2022   Procedure: EXAM UNDER ANESTHESIA WITH SUTURE LIGATION OF TWO BLEEDING PEDICLES;  Surgeon: Henrene Dodge, MD;  Location: [facility] ORS;  Service: General;  Laterality: N/A;   FLEXIBLE SIGMOIDOSCOPY N/A 10/02/2022   Procedure: FLEXIBLE SIGMOIDOSCOPY;  Surgeon: Meryl Dare, MD;  Location: [facility] ENDOSCOPY;  Service: Gastroenterology;  Laterality: N/A;   HEMOSTASIS CONTROL  08/09/2023   Procedure: HEMOSTASIS CONTROL;  Surgeon: Regis Bill, MD;  Location: [facility] ENDOSCOPY;  Service: Endoscopy;;   IR TIPS  04/11/2023   IR US GUIDE VASC ACCESS RIGHT  04/11/2023   IR US GUIDE VASC ACCESS RIGHT  04/11/2023   IR US GUIDE VASC ACCESS RIGHT  04/11/2023   LEFT HEART CATH AND CORONARY ANGIOGRAPHY N/A 11/04/2022   Procedure: LEFT HEART CATH AND CORONARY ANGIOGRAPHY;  Surgeon: Iran Ouch, MD;  Location: [facility] INVASIVE CV LAB;  Service: Cardiovascular;  Laterality: N/A;   RADIOLOGY WITH ANESTHESIA N/A 04/11/2023   Procedure: IR WITH ANESTHESIA;  Surgeon: Oley Balm, MD;  Location: [facility] OR;  Service: Radiology;  Laterality: N/A;   VISCERAL ANGIOGRAPHY N/A 10/24/2022   Procedure: VISCERAL ANGIOGRAPHY;  Surgeon: Annice Needy, MD;  Location: [facility] INVASIVE CV LAB;  Service: Cardiovascular;  Laterality: N/A;   VISCERAL ARTERY INTERVENTION N/A 05/09/2023   Procedure: VISCERAL ARTERY INTERVENTION;  Surgeon: Renford Dills, MD;  Location: [facility] INVASIVE CV LAB;  Service: Cardiovascular;  Laterality: N/A;     Home Medications:  Prior to Admission medications   Medication Sig Start Date End Date Taking? Authorizing Provider  albuterol (VENTOLIN HFA) 108 (90 Base) MCG/ACT inhaler Inhale 2 puffs into the lungs every 6 (six) hours as needed for wheezing or shortness of breath. 10/15/23  Yes Chesley Noon, MD  furosemide (LASIX) 40 MG tablet Take 1 tablet (40 mg total) by mouth daily. Increase to 1 tablet (40 mg total) by mouth TWICE daily (total daily dose 80 mg) as needed for up to 3 days for increased leg swelling, shortness of breath, weight gain 5+ lbs over 1-2 days. Seek medical care if these symptoms are not improving with increased dose. 10/18/23 12/17/23 Yes Sunnie Nielsen, DO  gabapentin (NEURONTIN) 100 MG capsule Take 1 capsule (100 mg total) by mouth 2 (two) times daily. Patient taking differently: Take 300 mg by mouth 2 (two) times daily. 10/18/23 12/12/23 Yes Alexander, Dorene Grebe, DO  ipratropium-albuterol (DUONEB) 0.5-2.5 (3) MG/3ML SOLN Take 3 mLs by nebulization every 2 (two) hours as needed (wheeze, SOB). 10/18/23  Yes Sunnie Nielsen, DO  isosorbide mononitrate (IMDUR) 30 MG 24 hr tablet Take 0.5 tablets (15 mg total) by mouth daily. 11/24/23  Yes Wouk, Wilfred Curtis, MD  meloxicam (MOBIC) 15 MG tablet Take 1 tablet (15 mg total) by mouth daily for 14 days. 12/11/23  12/25/23 Yes Bradler, Clent Jacks, MD  methocarbamol (ROBAXIN) 500 MG tablet Take 1 tablet (500 mg total) by mouth 4 (four) times daily for 10 days. 12/11/23 12/21/23 Yes Merwyn Katos, MD  pantoprazole (PROTONIX) 40 MG tablet Take 1 tablet (40 mg total) by mouth 2 (two) times daily. 10/18/23  Yes Sunnie Nielsen, DO  polyethylene glycol powder (GLYCOLAX/MIRALAX) 17 GM/SCOOP powder Take 17 g by mouth daily as needed (if less than 3 BM"s in past 24 hours). 08/25/23  Yes Esaw Grandchild A, DO  potassium chloride SA (KLOR-CON M) 20 MEQ tablet Take 1 tablet (20 mEq total) by mouth daily. 10/18/23  Yes Sunnie Nielsen, DO  predniSONE (STERAPRED UNI-PAK 21 TAB) 10 MG (21) TBPK tablet As directed on packaging 12/11/23  Yes Bradler, Clent Jacks, MD  QUEtiapine (SEROQUEL) 50 MG tablet Take 1 tablet (50 mg total) by mouth at bedtime. 10/18/23 12/12/23 Yes Alexander, Dorene Grebe, DO  rosuvastatin (CRESTOR) 40 MG tablet TAKE 1 TABLET BY MOUTH EVERY DAY 10/20/23  Yes Medina-Vargas, Monina C, NP  docusate sodium (COLACE) 100 MG capsule Take 1 capsule (100 mg total) by mouth 2 (two) times daily as needed (if less than 3 BM"s in past 24 hours). Patient not taking: Reported on 12/12/2023 08/25/23   Esaw Grandchild A, DO  feeding supplement (ENSURE ENLIVE / ENSURE PLUS) LIQD Take 237 mLs by mouth 2 (two) times daily between meals. 08/25/23   Esaw Grandchild A, DO  fluticasone furoate-vilanterol (BREO ELLIPTA) 100-25 MCG/ACT AEPB Inhale 1 puff into the lungs daily. Patient not taking: Reported on 11/23/2023 10/19/23   Sunnie Nielsen, DO  midodrine (PROAMATINE) 5 MG tablet Take 1 tablet (5 mg total) by mouth 3 (three) times daily with meals. Patient not taking: Reported on 11/23/2023 10/18/23   Sunnie Nielsen, DO  Nebulizers (COMPRESSOR/NEBULIZER) MISC Nebulizer + mask/hoses per patient preference and insurance coverage, use as directed 10/18/23   Sunnie Nielsen, DO  Respiratory Therapy Supplies (NEBULIZER/TUBING/MOUTHPIECE) KIT As  directed 10/18/23   Sunnie Nielsen, DO    Inpatient Medications: Scheduled Meds:  pantoprazole (PROTONIX) IV  40 mg Intravenous Q12H   rosuvastatin  40 mg Oral Daily   Continuous Infusions:  heparin 1,300 Units/hr (12/13/23 0939)   PRN Meds: acetaminophen **OR** acetaminophen, hydrALAZINE, ipratropium-albuterol, LORazepam, nicotine, nitroGLYCERIN, ondansetron **OR** ondansetron (ZOFRAN) IV  Allergies:    Allergies  Allergen Reactions   Penicillins Hives and Rash    Tolerated Ceftriaxone 10/2022  Social History:   Social History   Socioeconomic History   Marital status: Single    Spouse name: Not on file   Number of children: Not on file   Years of education: Not on file   Highest education level: Not on file  Occupational History   Not on file  Tobacco Use   Smoking status: Every Day    Current packs/day: 0.50    Types: Cigarettes   Smokeless tobacco: Never   Tobacco comments:    started age 33  Vaping Use   Vaping status: Never Used  Substance and Sexual Activity   Alcohol use: Yes    Alcohol/week: 2.0 standard drinks of alcohol    Types: 2 Cans of beer per week    Comment: 2 wine coolers per day   Drug use: Yes    Types: Cocaine   Sexual activity: Not Currently  Other Topics Concern   Not on file  Social History Narrative   Single, lives in [place] with his mother.  He does not routinely exercise.  He sometimes works, helping to deliver appliances to homes.   Social Drivers of Corporate investment banker Strain: Not on file  Food Insecurity: No Food Insecurity (11/23/2023)   Hunger Vital Sign    Worried About Running Out of Food in the Last Year: Never true    Ran Out of Food in the Last Year: Never true  Transportation Needs: Unmet Transportation Needs (11/24/2023)   PRAPARE - Administrator, Civil Service (Medical): Yes    Lack of Transportation (Non-Medical): Yes  Physical Activity: Not on file  Stress: Not on file  Social  Connections: Not on file  Intimate Partner Violence: Not At Risk (11/23/2023)   Humiliation, Afraid, Rape, and Kick questionnaire    Fear of Current or Ex-Partner: No    Emotionally Abused: No    Physically Abused: No    Sexually Abused: No    Family History:    Family History  Problem Relation Age of Onset   Coronary artery disease Mother        alive   Other Other        2 sisters alive and well   Alcohol abuse Father        died @ 75   Other Paternal Grandmother        PACER     ROS:  Please see the history of present illness.  Review of Systems  Constitutional:  Positive for malaise/fatigue.  Respiratory:  Positive for sputum production.   Cardiovascular:  Positive for chest pain.  Neurological:  Positive for weakness.    All other ROS reviewed and negative.     Physical Exam/Data:   Vitals:   12/13/23 0500 12/13/23 0630 12/13/23 0845 12/13/23 0854  BP: (!) 94/58 106/63    Pulse: 87 76 87   Resp: 12 12 11    Temp:    97.9 F (36.6 C)  TempSrc:    Oral  SpO2: 94% 95% 95%   Weight:      Height:       No intake or output data in the 24 hours ending 12/13/23 1126     12/12/2023    8:43 AM 12/11/2023   10:24 PM 12/11/2023    4:28 AM  Last 3 Weights  Weight (lbs) 191 lb 191 lb 190 lb  Weight (kg) 86.637 kg 86.637 kg 86.183 kg     Body mass index is 31.78 kg/m.  General:  Well nourished, well developed, in no acute distress HEENT: normal Neck: no JVD Vascular: No carotid bruits; Distal pulses 2+ bilaterally Cardiac:  normal S1, S2; RRR; no murmur  Lungs:  clear to auscultation bilaterally, no wheezing, rhonchi or rales  Abd: soft, nontender, no hepatomegaly  Ext: no edema Musculoskeletal:  No deformities, BUE and BLE strength normal and equal Skin: warm and dry  Neuro:  CNs 2-12 intact, no focal abnormalities noted Psych:  Normal affect   EKG:  The EKG was personally reviewed and demonstrates: Sinus tachycardia with a rate of 109, T wave inversions  in inferior leads, possible old anterior infarct noted previously Telemetry:  Telemetry was personally reviewed and demonstrates: Sinus rhythm with occasional unifocal PVCs with rates of 70-80  Relevant CV Studies: Echo 01/2023  1. Left ventricular ejection fraction, by estimation, is 50 to 55%. The  left ventricle has low normal function. The left ventricle has no regional  wall motion abnormalities. Left ventricular diastolic parameters are  indeterminate.   2. Right ventricular systolic function is normal. The right ventricular  size is normal.   3. The mitral valve is normal in structure. Mild mitral valve  regurgitation. No evidence of mitral stenosis.   4. The aortic valve is normal in structure. Aortic valve regurgitation is  not visualized. No aortic stenosis is present.   5. The inferior vena cava is normal in size with greater than 50%  respiratory variability, suggesting right atrial pressure of 3 mmHg.    Cardiac Cath 10/2022    Mid Cx lesion is 100% stenosed.   Ramus lesion is 85% stenosed.   Prox LAD to Mid LAD lesion is 5% stenosed.   Prox RCA to Mid RCA lesion is 100% stenosed.   Prox RCA lesion is 80% stenosed.   There is mild left ventricular systolic dysfunction.   LV end diastolic pressure is moderately elevated.   The left ventricular ejection fraction is 45-50% by visual estimate.   1.  Severe two-vessel coronary artery disease with chronically occluded mid left circumflex and mid right coronary artery with collaterals.  These vessels were patent with mild disease on previous cardiac catheterization in 2012.  LAD stent is patent with minimal restenosis.  In addition, there is progression of proximal to mid ramus stenosis. 2.  Mildly reduced LV systolic function with an EF of 45%. 3.  Moderately elevated left ventricular end-diastolic pressure at 26 mmHg.   Recommendations: The patient has 2 chronic occlusions with collaterals.  Recommend medical therapy for now.   I added small dose Imdur.  Given ongoing rectal bleeding, risks of PCI outweigh the benefit at this time.  In the near future, once his bleeding issues are controlled and if he continues to have anginal symptoms, PCI of the ramus and possibly the right coronary artery can be considered.  The RCA has a microchannel of patent flow. Continue IV furosemide today given elevated LVEDP and switch to oral likely tomorrow.  Laboratory Data:  High Sensitivity Troponin:   Recent Labs  Lab 12/11/23 2228 12/12/23 0900 12/12/23 1035 12/12/23 1454 12/12/23 1941  TROPONINIHS 14 256* 270* 313* 287*     Chemistry Recent Labs  Lab 12/11/23 2228 12/12/23 0900 12/13/23 0403  NA 137 137 136  K 3.4* 3.4* 3.8  CL 106 105 107  CO2 16* 20* 22  GLUCOSE 146* 131* 103*  BUN 6 8 10   CREATININE 0.59* 0.62 0.64  CALCIUM 8.8* 8.7* 8.1*  MG  --  1.5*  --   GFRNONAA >60 >60 >60  ANIONGAP 15 12 7     Recent Labs  Lab 12/12/23 0900  PROT 7.0  ALBUMIN 3.0*  AST 56*  ALT 16  ALKPHOS 165*  BILITOT 3.0*   Lipids No results for input(s): "CHOL", "TRIG", "HDL", "LABVLDL", "LDLCALC", "CHOLHDL" in the last 168 hours.  Hematology Recent Labs  Lab 12/11/23 2228 12/12/23 0900 12/13/23 0403  WBC 6.2 4.5 4.8  RBC 3.38* 3.08* 2.88*  HGB 10.5* 9.7* 8.9*  HCT 32.8* 29.2* 27.4*  MCV 97.0 94.8 95.1  MCH 31.1 31.5 30.9  MCHC 32.0 33.2 32.5  RDW 16.6* 17.0* 16.8*  PLT 114* 97* 101*   Thyroid No results for input(s): "TSH", "FREET4" in the last 168 hours.  BNPNo results for input(s): "BNP", "PROBNP" in the last 168 hours.  DDimer No results for input(s): "DDIMER" in the last 168 hours.   Radiology/Studies:  CT Angio Chest PE W and/or Wo Contrast Result Date: 12/12/2023 CLINICAL DATA:  Pulmonary embolism (PE) suspected, high prob. Chest pain. EXAM: CT ANGIOGRAPHY CHEST WITH CONTRAST TECHNIQUE: Multidetector CT imaging of the chest was performed using the standard protocol during bolus administration of  intravenous contrast. Multiplanar CT image reconstructions and MIPs were obtained to evaluate the vascular anatomy. RADIATION DOSE REDUCTION: This exam was performed according to the departmental dose-optimization program which includes automated exposure control, adjustment of the mA and/or kV according to patient size and/or use of iterative reconstruction technique. CONTRAST:  75mL OMNIPAQUE IOHEXOL 350 MG/ML SOLN COMPARISON:  CTA chest, abdomen, pelvis dated November 14, 2023. FINDINGS: Cardiovascular: Satisfactory opacification of the pulmonary arteries to the segmental level. No evidence of pulmonary embolism. There is dilation of the main pulmonary artery measuring up to 3.7 cm in diameter, which can be seen in the setting of pulmonary arterial hypertension. The heart is enlarged, unchanged. No pericardial effusion. The thoracic aorta is normal in caliber. Atherosclerotic calcification of the thoracic aorta and coronary arteries. Mediastinum/Nodes: No enlarged mediastinal, hilar, or axillary lymph nodes. Adherent secretions within the trachea. Thyroid gland and esophagus demonstrate no significant findings. Lungs/Pleura: No focal consolidation, pleural effusion, or pneumothorax. Unchanged 5 mm right middle lobe nodule (series 5, image 84). No specific follow-up imaging recommended. Upper Abdomen: Partially imaged tips and celiac artery and SMA stent. Similar cirrhotic morphology of the liver. Musculoskeletal: No acute or significant osseous findings. Similar defect at the superolateral left humeral head, may reflect sequela of prior dislocation. Review of the MIP images confirms the above findings. IMPRESSION: 1. No evidence of pulmonary embolism. 2. No acute intrathoracic findings. 3. Dilation of the main pulmonary artery, which can be seen in the setting of pulmonary arterial hypertension. 4. Cardiomegaly. 5.  Aortic Atherosclerosis (ICD10-I70.0). Electronically Signed   By: Hart Robinsons M.D.   On:  12/12/2023 11:40   DG Chest 2 View Result Date: 12/12/2023 CLINICAL DATA:  Chest pain. EXAM: CHEST - 2 VIEW COMPARISON:  Chest radiograph dated November 24, 2023. FINDINGS: The heart size and mediastinal contours are unchanged. No focal consolidation, pleural effusion, or pneumothorax. No acute osseous abnormality. IMPRESSION: No acute cardiopulmonary findings. Electronically Signed   By: Hart Robinsons M.D.   On: 12/12/2023 09:44   MR LUMBAR SPINE WO CONTRAST Result Date: 12/11/2023 CLINICAL DATA:  52 year old male with acute on chronic low back pain, difficulty walking. Bilateral leg weakness. "Legs do not work". EXAM: MRI LUMBAR SPINE WITHOUT CONTRAST TECHNIQUE: Multiplanar, multisequence MR imaging of the lumbar spine was performed. No intravenous contrast  was administered. COMPARISON:  CT Abdomen and Pelvis 11/14/2023. Lumbar MRI 05/07/2023. FINDINGS: Segmentation: Normal on the comparison CT, the same numbering system used on the May MRI. Alignment: Stable lumbar lordosis since May. No significant scoliosis. Mild degenerative appearing retrolisthesis of L5 on S1. Vertebrae: Patchy endplate marrow edema at L5-S1 appears likely to be degenerative in nature, with vacuum disc there last month, still visible by MRI today, new since May. Surrounding disc desiccation. Mild disc space loss since May. Maintained vertebral body height. Background bone marrow signal is stable, with borderline to mild generalized nonspecific decreased T1 signal. But no other areas of marrow edema. Visible sacrum and SI joints appear intact. Conus medullaris and cauda equina: Conus extends to the T12-L1 level. No lower spinal cord or conus signal abnormality. Normal cauda equina nerve roots. Paraspinal and other soft tissues: Negative visible abdominal viscera. No convincing paraspinal soft tissue inflammation. Disc levels: T11-T12: Some disc desiccation but no disc bulge or protrusion. Mild posterior element hypertrophy. No  spinal stenosis. T12-L1:  Negative. L1-L2:  Negative. L2-L3: Minimal, far lateral disc bulging. Mild facet and ligament flavum hypertrophy. No stenosis. L3-L4: Mild disc desiccation and circumferential disc bulge. Mild posterior element hypertrophy. No significant stenosis. L4-L5: Mild disc space loss and circumferential disc bulge asymmetric to the right. Broad-based right foraminal disc does appear mildly increased since May (series 8, image 23). Only mild facet hypertrophy. No spinal or lateral recess stenosis. But mild to moderate right L4 neural foraminal stenosis which is stable versus mildly increased. L5-S1: Increased disc desiccation, new vacuum disc since May. Circumferential disc bulge with indistinct and bulky left far lateral disc extrusion now (series 8, image 30). Superimposed mild facet and ligament flavum hypertrophy. Trace facet joint fluid on the left is chronic. There is no spinal or lateral recess stenosis. However, moderate to severe left L5 neural foraminal stenosis has increased. And contralateral mild to moderate right foraminal stenosis is new. IMPRESSION: 1. Compared to May MRI the dominant finding is progressed and far lateral L5-S1 disc extrusion, with new vacuum disc there which was present on CTA last month. Mild endplate marrow edema at that level appears to be degenerative in nature. Subsequent increased and moderate to severe left and up to moderate right L5 neural foraminal stenosis. Query L5 radiculitis. 2. L4-L5 right foraminal disc protrusion also appears mildly increased. Query right L4 radiculitis. 3. No significant lumbar spinal stenosis. No visible lower thoracic spinal stenosis. Electronically Signed   By: Odessa Fleming M.D.   On: 12/11/2023 07:48     Assessment and Plan:   NSTEMI/coronary artery disease/chest pain -Presented with chest discomfort -No ischemic changes noted on EKG -Prior heart catheterization completed 10/2022 showed severe two-vessel CAD with CTO to the  mid left circumflex and CTO of the mid RCA, LAD stent patent, collaterals were noted, no intervention was completed at that time -Prior echocardiogram 03/2023 with an LVEF of 55-60% -Repeat echocardiogram ordered and pending with further recommendations to follow -He is continued on rosuvastatin -Not on aspirin therapy due to concerns for GI bleeding -Currently on heparin infusion for minimum of 48 hours -High-sensitivity troponins trended to 56, 270, 313, 287- flat, likely demand ischemia in the setting of cocaine and alcohol use -Beta-blockers deferred due to chronic cocaine use -Long-acting nitrates not given due to chronic hypotension if blood pressure remains stable can consider restarting low-dose Imdur at 15 mg daily -Continue on telemetry monitoring -Currently chest pain-free -EKG as needed for pain or changes  Polysubstance abuse (  tobacco, alcohol, cocaine) -UDS on 12/11/2023 positive for cocaine -Patient states he continues to smoke, NicoDerm CQ patch  -Patient also stated that he is drinking his wine coolers prior to arrival -Total cessation is recommended  Chronic hypotension -Blood pressure 94/58 -If blood pressure continues to remain soft can restart midodrine 5 mg 3 times daily -Vital signs per unit protocol  4.   Chronic HFpEF -Mild shortness of breath upon arrival that has resolved -Echocardiogram 01/2023 showed LVEF 50-55% -Kidney function remained stable -Maintaining oxygen saturations on room air -Home medication list furosemide 40 mg daily -Reading therapy not restarted due to soft blood pressures -Echocardiogram ordered and pending with further recommendations to follow -Daily weights, I's and O's, low-sodium diet  Risk Assessment/Risk Scores:     TIMI Risk Score for Unstable Angina or Non-ST Elevation MI:   The patient's TIMI risk score is 5, which indicates a 26% risk of all cause mortality, new or recurrent myocardial infarction or need for urgent  revascularization in the next 14 days.          For questions or updates, please contact [facility] Please consult [email or web address] for contact info under    Signed, Eshal Propps, NP  12/13/2023 11:26 AM

## 2023-12-14 DIAGNOSIS — F1090 Alcohol use, unspecified, uncomplicated: Secondary | ICD-10-CM | POA: Diagnosis not present

## 2023-12-14 DIAGNOSIS — E271 Primary adrenocortical insufficiency: Secondary | ICD-10-CM | POA: Diagnosis not present

## 2023-12-14 DIAGNOSIS — I214 Non-ST elevation (NSTEMI) myocardial infarction: Secondary | ICD-10-CM | POA: Diagnosis not present

## 2023-12-14 DIAGNOSIS — K7030 Alcoholic cirrhosis of liver without ascites: Secondary | ICD-10-CM | POA: Diagnosis not present

## 2023-12-14 LAB — CBC
HCT: 26.2 % — ABNORMAL LOW (ref 39.0–52.0)
Hemoglobin: 8.5 g/dL — ABNORMAL LOW (ref 13.0–17.0)
MCH: 31.3 pg (ref 26.0–34.0)
MCHC: 32.4 g/dL (ref 30.0–36.0)
MCV: 96.3 fL (ref 80.0–100.0)
Platelets: 102 10*3/uL — ABNORMAL LOW (ref 150–400)
RBC: 2.72 MIL/uL — ABNORMAL LOW (ref 4.22–5.81)
RDW: 16.5 % — ABNORMAL HIGH (ref 11.5–15.5)
WBC: 4.4 10*3/uL (ref 4.0–10.5)
nRBC: 0 % (ref 0.0–0.2)

## 2023-12-14 LAB — LIPID PANEL
Cholesterol: 165 mg/dL (ref 0–200)
HDL: 27 mg/dL — ABNORMAL LOW (ref >40)
LDL Cholesterol: 126 mg/dL — ABNORMAL HIGH (ref 0–99)
Total CHOL/HDL Ratio: 6.1 {ratio}
Triglycerides: 62 mg/dL (ref <150)
VLDL: 12 mg/dL (ref 0–40)

## 2023-12-14 LAB — SEDIMENTATION RATE: Sed Rate: 36 mm/h — ABNORMAL HIGH (ref 0–20)

## 2023-12-14 LAB — HEPARIN LEVEL (UNFRACTIONATED): Heparin Unfractionated: 0.47 [IU]/mL (ref 0.30–0.70)

## 2023-12-14 MED ORDER — ENOXAPARIN SODIUM 40 MG/0.4ML IJ SOSY
40.0000 mg | PREFILLED_SYRINGE | INTRAMUSCULAR | Status: DC
  Administered 2023-12-14: 40 mg via SUBCUTANEOUS
  Filled 2023-12-14: qty 0.4

## 2023-12-14 MED ORDER — MIDODRINE HCL 5 MG PO TABS
2.5000 mg | ORAL_TABLET | Freq: Three times a day (TID) | ORAL | Status: DC
  Administered 2023-12-14 – 2023-12-18 (×13): 2.5 mg via ORAL
  Filled 2023-12-14 (×13): qty 1

## 2023-12-14 NOTE — Progress Notes (Signed)
° °  Patient Name: Ethan Wells Date of Encounter: 12/14/2023 [facility] Cardiologist: Verne Carrow, MD   Interval Summary  .    Patient seen on a.m. rounds.  Blood pressures have been low.  Continues to complain of chest discomfort.  When walking in the room patient is sleeping.  Denies any shortness of breath.  Vital Signs .    Vitals:   12/13/23 2045 12/13/23 2110 12/13/23 2328 12/14/23 0332  BP: (!) 74/36 (!) 92/53 (!) 102/51 (!) 96/49  Pulse: 82 96 85 74  Resp: 18 14 16 18   Temp: 98.1 F (36.7 C) 98 F (36.7 C) 98.3 F (36.8 C) 98.2 F (36.8 C)  TempSrc:  Oral  Oral  SpO2: 97% 96% 97% 98%  Weight:      Height:        Intake/Output Summary (Last 24 hours) at 12/14/2023 0941 Last data filed at 12/14/2023 0900 Gross per 24 hour  Intake 491.28 ml  Output 775 ml  Net -283.72 ml      12/12/2023    8:43 AM 12/11/2023   10:24 PM 12/11/2023    4:28 AM  Last 3 Weights  Weight (lbs) 191 lb 191 lb 190 lb  Weight (kg) 86.637 kg 86.637 kg 86.183 kg      Telemetry/ECG    Sinus rhythm rate 70-80- Personally Reviewed  Physical Exam .   GEN: No acute distress.   Neck: No JVD Cardiac: RRR, no murmurs, rubs, or gallops.  Respiratory: Clear to auscultation bilaterally. GI: Soft, nontender, non-distended  MS: No edema  Assessment & Plan .     NSTEMI/coronary artery disease/chest pain -Presented with chest discomfort -No ischemic changes noted on EKG -Prior heart catheterization completed 10/2022 showed severe two-vessel CAD with CTO to the mid left circumflex and CTO of the mid RCA, LAD stent patent, collaterals were noted, no intervention recommended at that time -He is continued on rosuvastatin -Not on aspirin for previous concerns of GI bleeding -Remains on heparin infusion for minimum 48 hours -High-sensitivity troponins trended and peaked at 313 likely demand ischemia in the setting of vasospasm from cocaine use and alcohol use -Restarted  on Imdur 15 mg daily yesterday -Continue on telemetry monitoring -Patient awoken from sleep this morning for exam states that he has had chest pain for days -EKG as needed for pain or changes -No plans for any further ischemic workup  Polysubstance abuse -UDS on 12/11/2023 positive for cocaine again -Endorses he continues to drink wine coolers and smoke -Total cessation is recommended  Chronic hypotension -Blood pressures remain soft -Midodrine 2.5 mg 3 times daily restarted -Vital signs per unit protocol  Chronic HFpEF -Mild shortness of breath upon arrival that has resolved -Maintaining oxygen saturations on room air -Echocardiogram 01/2023 showed an LVEF of 50-55% -Updated echocardiogram ordered and pending with further recommendations to follow -Daily weights, I's and O's, low-sodium diet  For questions or updates, please contact [facility] Please consult [email or web address] for contact info under        Signed, Yanina Knupp, NP

## 2023-12-14 NOTE — Progress Notes (Signed)
Progress Note   [name] ZOX:[id number] DOB: Aug 19, 1971 DOA: 12/12/2023     2 DOS: the patient was seen and examined on 12/14/2023   Brief hospital course: [name] is a 52 y.o. male with a hx of coronary artery disease with prior STEMI and VF arrest in 01/2011 with LAD stenting, NSTEMI in 10/2022, ischemic cardiomyopathy, cirrhosis status post TIPS, polysubstance abuse, recurrent GI bleeding, hypertension, hyperlipidemia, mesenteric disease status post SMA stenting, presented for for the evaluation of chest pain.  Patient is started on heparin drip per NSTEMI protocol.  Echocardiogram ordered.  His UDS is positive for cocaine.  Cardiology evaluated advised no beta-blocker or nitrates due to low blood pressures, not on aspirin due to GI bleed, further management pending echo.  Assessment and Plan: * NSTEMI (non-ST elevated myocardial infarction) (HCC) Ischemic cardiomyopathy-EF recovered. H/o of CAD status post PCI to LAD, hyperlipidemia, hypertension Continue heparin per pharmacy protocol. Complete echo pending. Troponin trending down. Echo showed preserved EF, no wall motion abnormalities. Cardiology follow-up appreciated. Patient has chronic anemia and ongoing bleeding and so IV heparin is stopped.  Patient is not a candidate for cardiac cath at this time.  Continue medical management. UDS + for cocaine 12/26, no beta blocker. Can't give Nitrates due to low BP. Continue Statin. Not on aspirin due to GI bleed.  Polysubstance abuse (HCC) UDS positive for cocaine. Advised to quit illicit drug. Smoking cessation advised. Advised to strop drinking alcohol.  Tobacco abuse Continue nicotine patch. Advised to quit smoking.  Chronic Hypotension BP lower side Caution with antihypertensives.  COPD (chronic obstructive pulmonary disease) (HCC) Does not appear to be in acute exacerbation DuoNebs every 6 hours as needed for wheezing, shortness of breath, 4  days ordered  Chronic diastolic CHF Stable. Last Echo 2/24 EF 50%. Will resume Lasix if BP better. Monitor daily wrights, strict ins and output.  Hyperbilirubinemia Appears stable over the last 2 months  Hypokalemia- Improved.  Chronic Anemia- Stable. Baseline hemoglobin around 8. Monitor for bleeding as he is on heparin drip.    Out of bed to chair. Incentive spirometry. Nursing supportive care. Fall, aspiration precautions. DVT prophylaxis   Code Status: Full Code  Subjective: Patient is seen and examined today morning.  He is sleeping comfortably, upon questioning states that he has constant chest discomfort, radiating to left shoulder. Feels weak.  Eating fair.    Physical Exam: Vitals:   12/13/23 2328 12/14/23 0332 12/14/23 1008 12/14/23 1311  BP: (!) 102/51 (!) 96/49 (!) 98/56 (!) 99/54  Pulse: 85 74 75 74  Resp: 16 18 15 17   Temp: 98.3 F (36.8 C) 98.2 F (36.8 C) 98 F (36.7 C) 98.3 F (36.8 C)  TempSrc:  Oral    SpO2: 97% 98% 99% 99%  Weight:      Height:        General - Middle aged Caucasian obese male, in distress due to pain HEENT - PERRLA, EOMI, atraumatic head, non tender sinuses. Lung - Clear, basal rales, rhonchi, no wheezes. Heart - S1, S2 heard, no murmurs, rubs, trace pedal edema. Abdomen - Soft, non tender, obese, bowel sounds good Neuro - Alert, awake and oriented x 3, non focal exam. Skin - Warm and dry.  Data Reviewed:      Latest Ref Rng & Units 12/14/2023    3:59 AM 12/13/2023   12:13 PM 12/13/2023    4:03 AM  CBC  WBC 4.0 - 10.5 K/uL 4.4  5.6  4.8  Hemoglobin 13.0 - 17.0 g/dL 8.5  8.7  8.9   Hematocrit 39.0 - 52.0 % 26.2  26.5  27.4   Platelets 150 - 400 K/uL 102  111  101       Latest Ref Rng & Units 12/13/2023    4:03 AM 12/12/2023    9:00 AM 12/11/2023   10:28 PM  BMP  Glucose 70 - 99 mg/dL 098  119  147   BUN 6 - 20 mg/dL 10  8  6    Creatinine 0.61 - 1.24 mg/dL 8.29  5.62  1.30   Sodium 135 - 145 mmol/L 136   137  137   Potassium 3.5 - 5.1 mmol/L 3.8  3.4  3.4   Chloride 98 - 111 mmol/L 107  105  106   CO2 22 - 32 mmol/L 22  20  16    Calcium 8.9 - 10.3 mg/dL 8.1  8.7  8.8    ECHOCARDIOGRAM COMPLETE Result Date: 12/13/2023    ECHOCARDIOGRAM REPORT   Patient Name:   Ethan Wells Date of Exam: 12/13/2023 Medical Rec #:  [id number]           Height:       65.0 in Accession #:    [id number]          Weight:       191.0 lb Date of Birth:  [birth year]           BSA:          1.940 m Patient Age:    52 years            BP:           118/67 mmHg Patient Gender: M                   HR:           72 bpm. Exam Location:  [facility] Procedure: 2D Echo, 3D Echo, Cardiac Doppler and Color Doppler Indications:     Elevated Troponin                  Chest Pain R07.9  History:         Patient has prior history of Echocardiogram examinations, most                  recent 04/11/2023. Cardiomyopathy and CHF, CAD and Previous                  Myocardial Infarction, COPD and Migraines, Signs/Symptoms:Chest                  Pain; Risk Factors:Hypertension, Dyslipidemia and Current                  Smoker.  Sonographer:     Lucendia Herrlich RCS Referring Phys:  [id number] AMY N [facility] Diagnosing Phys: Chilton Si MD IMPRESSIONS  1. Left ventricular ejection fraction, by estimation, is 50 to 55%. Left ventricular ejection fraction by 3D volume is 54 %. The left ventricle has low normal function. The left ventricle has no regional wall motion abnormalities. Left ventricular diastolic parameters are consistent with Grade I diastolic dysfunction (impaired relaxation).  2. Right ventricular systolic function is normal. The right ventricular size is normal. There is mildly elevated pulmonary artery systolic pressure.  3. The mitral valve is normal in structure. No evidence of mitral valve regurgitation. No evidence of mitral stenosis.  4. The aortic valve is tricuspid. Aortic valve regurgitation is  not visualized. No aortic stenosis is present.   5. The inferior vena cava is normal in size with greater than 50% respiratory variability, suggesting right atrial pressure of 3 mmHg. FINDINGS  Left Ventricle: Left ventricular ejection fraction, by estimation, is 50 to 55%. Left ventricular ejection fraction by 3D volume is 54 %. The left ventricle has low normal function. The left ventricle has no regional wall motion abnormalities. The left ventricular internal cavity size was normal in size. There is no left ventricular hypertrophy. Left ventricular diastolic parameters are consistent with Grade I diastolic dysfunction (impaired relaxation). Right Ventricle: The right ventricular size is normal. No increase in right ventricular wall thickness. Right ventricular systolic function is normal. There is mildly elevated pulmonary artery systolic pressure. The tricuspid regurgitant velocity is 2.94  m/s, and with an assumed right atrial pressure of 3 mmHg, the estimated right ventricular systolic pressure is 37.6 mmHg. Left Atrium: Left atrial size was normal in size. Right Atrium: Right atrial size was normal in size. Pericardium: There is no evidence of pericardial effusion. Mitral Valve: The mitral valve is normal in structure. No evidence of mitral valve regurgitation. No evidence of mitral valve stenosis. Tricuspid Valve: The tricuspid valve is normal in structure. Tricuspid valve regurgitation is trivial. No evidence of tricuspid stenosis. Aortic Valve: The aortic valve is tricuspid. Aortic valve regurgitation is not visualized. No aortic stenosis is present. Aortic valve peak gradient measures 7.3 mmHg. Pulmonic Valve: The pulmonic valve was normal in structure. Pulmonic valve regurgitation is not visualized. No evidence of pulmonic stenosis. Aorta: The aortic root is normal in size and structure. Venous: The inferior vena cava is normal in size with greater than 50% respiratory variability, suggesting right atrial pressure of 3 mmHg. IAS/Shunts: No atrial  level shunt detected by color flow Doppler.  LEFT VENTRICLE PLAX 2D LVIDd:         6.00 cm         Diastology LVIDs:         4.30 cm         LV e' medial:    5.98 cm/s LV PW:         0.90 cm         LV E/e' medial:  11.3 LV IVS:        0.80 cm         LV e' lateral:   9.03 cm/s LVOT diam:     2.20 cm         LV E/e' lateral: 7.5 LV SV:         75 LV SV Index:   39 LVOT Area:     3.80 cm        3D Volume EF                                LV 3D EF:    Left                                             ventricul                                             ar  ejection                                             fraction                                             by 3D                                             volume is                                             54 %.                                 3D Volume EF:                                3D EF:        54 %                                LV EDV:       225 ml                                LV ESV:       103 ml                                LV SV:        122 ml RIGHT VENTRICLE             IVC RV S prime:     14.60 cm/s  IVC diam: 1.70 cm TAPSE (M-mode): 2.9 cm LEFT ATRIUM             Index        RIGHT ATRIUM           Index LA diam:        4.30 cm 2.22 cm/m   RA Area:     14.70 cm LA Vol (A2C):   47.7 ml 24.59 ml/m  RA Volume:   32.80 ml  16.91 ml/m LA Vol (A4C):   55.1 ml 28.40 ml/m LA Biplane Vol: 54.0 ml 27.84 ml/m  AORTIC VALVE AV Area (Vmax): 2.84 cm AV Vmax:        135.00 cm/s AV Peak Grad:   7.3 mmHg LVOT Vmax:      101.00 cm/s LVOT Vmean:     65.700 cm/s LVOT VTI:       0.198 m  AORTA Ao Root diam: 3.40 cm Ao Asc diam:  3.00 cm MITRAL VALVE               TRICUSPID VALVE MV Area (PHT): 3.91 cm  TR Peak grad:   34.6 mmHg MV Decel Time: 194 msec    TR Vmax:        294.00 cm/s MV E velocity: 67.60 cm/s MV A velocity: 80.70 cm/s  SHUNTS MV E/A ratio:  0.84        Systemic VTI:  0.20 m                             Systemic Diam: 2.20 cm Chilton Si MD Electronically signed by Chilton Si MD Signature Date/Time: 12/13/2023/5:44:27 PM    Final     Family Communication: Discussed with patient he understand and agree. All questions answereed.  Disposition: Status is: Inpatient Remains inpatient appropriate because: Off heparin drip, trend hemoglobin, cardiology follow-up.  Planned Discharge Destination: Home with Home Health     Time spent: 38 minutes  Author: Marcelino Duster, MD 12/14/2023 3:13 PM Secure chat 7am to 7pm For on call review [email or web address].

## 2023-12-14 NOTE — Progress Notes (Signed)
PHARMACY - ANTICOAGULATION CONSULT NOTE  Pharmacy Consult for Heparin Infusion Indication: chest pain/ACS  Allergies  Allergen Reactions   Penicillins Hives and Rash    Tolerated Ceftriaxone 10/2022    Patient Measurements: Height: 5\' 5"  (165.1 cm) Weight: 86.6 kg (191 lb) IBW/kg (Calculated) : 61.5 Heparin Dosing Weight: 79.8 kg  Vital Signs: Temp: 98.2 F (36.8 C) (12/29 0332) Temp Source: Oral (12/29 0332) BP: 96/49 (12/29 0332) Pulse Rate: 74 (12/29 0332)  Labs: Recent Labs    12/11/23 2228 12/12/23 0900 12/12/23 0900 12/12/23 1035 12/12/23 1254 12/12/23 1257 12/12/23 1454 12/12/23 1941 12/13/23 0403 12/13/23 1213 12/13/23 2026 12/14/23 0359  HGB 10.5* 9.7*  --   --   --   --   --   --  8.9* 8.7*  --  8.5*  HCT 32.8* 29.2*  --   --   --   --   --   --  27.4* 26.5*  --  26.2*  PLT 114* 97*  --   --   --   --   --   --  101* 111*  --  102*  APTT  --   --   --   --   --  37*  --   --   --   --   --   --   LABPROT  --   --   --   --  18.4*  --   --   --   --   --   --   --   INR  --   --   --   --  1.5*  --   --   --   --   --   --   --   HEPARINUNFRC  --   --    < >  --   --   --   --  0.17* 0.37 0.27* 0.43 0.47  CREATININE 0.59* 0.62  --   --   --   --   --   --  0.64  --   --   --   TROPONINIHS 14 256*  --  270*  --   --  313* 287*  --   --   --   --    < > = values in this interval not displayed.    Estimated Creatinine Clearance: 110.5 mL/min (by C-G formula based on SCr of 0.64 mg/dL).   Medical History: Past Medical History:  Diagnosis Date   (HFpEF) heart failure with preserved ejection fraction (HCC)    Addison disease (HCC)    CAD (coronary artery disease)    a. 01/2011 Anterior STEMI/Cath/PCI: LM nl, LAD 100d (3.5x28mm Vision BMS placed), LCX 24m, RI 50, RCA min irregs, EF 40% w/ apical, inferoapical HK; b. 10/2022 NSTSEMI/Cath: LM nl, LAD 5p/m, RI 85, LCX 121m, OM1 mild dzs, OM3 100 fills via L->L collats from dLAD, RCA 80p, 138m, RPDA fills via  collats from LAD. EF 45-50%-->Med Rx.   CAD, multiple vessel    Cardiac arrest - ventricular fibrillation    a. In setting of STEMI 01/2011   Chronic, continuous use of opioids    Cocaine abuse (HCC)    COPD (chronic obstructive pulmonary disease) (HCC)    Depression with anxiety    ETOH abuse    a. 6-12 beers/day   GERD (gastroesophageal reflux disease)    GI bleed    Hemorrhoids    Hyperlipidemia    Hypertension  Ischemic cardiomyopathy    a. 06/2011 Echo: EF 45-50%, No rwma; b. 10/2022 Echo: EF 55-60%, no rwma, nl RV fxn.   Liver cirrhosis, alcoholic (HCC)    Marijuana abuse    Migraines    Noncompliance with immunization regimen    Obesity (BMI 30.0-34.9)    Orthostatic hypertension    midodrine   Portal hypertension (HCC)    s/p TIPS   STEMI (ST elevation myocardial infarction) (HCC) 2012   Tobacco abuse    a. 1/2 ppd x 26 yrs   Medications:  Not on anticoagulation prior to admission  Assessment: Patient is a 52 year old male with a past medical history of liver cirrhosis s/p TIPS, superior mesenteric artery stenosis s/p stenting, CAD, STEMI s/p PCI with stenting, HFpEF, ischemic cardiomyopathy, prior V-fib cardiac arrest, hypertension, hyperlipidemia, GERD, migraine headaches polysubstance abuse (cocaine, alcohol, marijuana and tobacco), orthostatic hypotension, Addison's/adrenal insufficiency, chronic narcotic dependence, and recurrent GI bleeds who presented with chest pain. Troponin 256>270. Pharmacy was consulted to initiate patient on a heparin infusion for ACS/STEMI. Patient was not on anticoagulation at home.   12/27 1941 HL 0.17 subtherapeutic on 1000 units/hr 12/28 0403 HL 0.37, therapeutic x 1 / Hgb 9.7 >> 8.9 12/28 1213 HL 0.27, subtherapeutic    Hgb 8.9>8.7 12/28 2026 HL 0.43, therapeutic x1 12/29 0359 HL 0.47, therapeutic x 2 / Hgb 8.7 > 8.5  Goal of Therapy:  Heparin level 0.3-0.7 units/ml Monitor platelets by anticoagulation protocol: Yes   Plan:   Will not give any boluses per MD given patient's history of recurrent GI bleeds Continue heparin infusion at 1450 units/hr Check HL daily w/ AM labs while therapeutic Continue to monitor CBC daily while on heparin   Thank you for involving pharmacy in this patient's care.   Otelia Sergeant, PharmD, [facility] 12/14/2023 7:20 AM

## 2023-12-14 NOTE — Plan of Care (Signed)

## 2023-12-15 ENCOUNTER — Encounter: Payer: Self-pay | Admitting: Internal Medicine

## 2023-12-15 ENCOUNTER — Other Ambulatory Visit (HOSPITAL_COMMUNITY): Payer: Self-pay

## 2023-12-15 ENCOUNTER — Telehealth (HOSPITAL_COMMUNITY): Payer: Self-pay | Admitting: Pharmacy Technician

## 2023-12-15 DIAGNOSIS — E271 Primary adrenocortical insufficiency: Secondary | ICD-10-CM | POA: Diagnosis not present

## 2023-12-15 DIAGNOSIS — K7030 Alcoholic cirrhosis of liver without ascites: Secondary | ICD-10-CM | POA: Diagnosis not present

## 2023-12-15 DIAGNOSIS — F1090 Alcohol use, unspecified, uncomplicated: Secondary | ICD-10-CM | POA: Diagnosis not present

## 2023-12-15 DIAGNOSIS — I214 Non-ST elevation (NSTEMI) myocardial infarction: Secondary | ICD-10-CM | POA: Diagnosis not present

## 2023-12-15 LAB — CBC
HCT: 27.3 % — ABNORMAL LOW (ref 39.0–52.0)
Hemoglobin: 9.2 g/dL — ABNORMAL LOW (ref 13.0–17.0)
MCH: 32.1 pg (ref 26.0–34.0)
MCHC: 33.7 g/dL (ref 30.0–36.0)
MCV: 95.1 fL (ref 80.0–100.0)
Platelets: 108 10*3/uL — ABNORMAL LOW (ref 150–400)
RBC: 2.87 MIL/uL — ABNORMAL LOW (ref 4.22–5.81)
RDW: 16.3 % — ABNORMAL HIGH (ref 11.5–15.5)
WBC: 4.3 10*3/uL (ref 4.0–10.5)
nRBC: 0 % (ref 0.0–0.2)

## 2023-12-15 MED ORDER — OXYCODONE-ACETAMINOPHEN 5-325 MG PO TABS
1.0000 | ORAL_TABLET | ORAL | Status: DC | PRN
  Administered 2023-12-15 – 2023-12-16 (×7): 1 via ORAL
  Filled 2023-12-15 (×8): qty 1

## 2023-12-15 MED ORDER — ENOXAPARIN SODIUM 60 MG/0.6ML IJ SOSY
45.0000 mg | PREFILLED_SYRINGE | INTRAMUSCULAR | Status: DC
  Administered 2023-12-15 – 2023-12-17 (×3): 45 mg via SUBCUTANEOUS
  Filled 2023-12-15 (×3): qty 0.6

## 2023-12-15 NOTE — Progress Notes (Signed)
Heart Failure Nurse Navigator Progress Note  PCP: Pcp, No PCP-Cardiologist: Verne Carrow, MD Admission Diagnosis: NSTEMI (non-ST elevated myocardial infarction) [facility]) Admitted from: Home via EMS  Presentation:   Ethan Wells presented with complaints of chest pain and mild shortness of breath.  ECHO/ LVEF: 50-55%  Clinical Course:  Past Medical History:  Diagnosis Date   (HFpEF) heart failure with preserved ejection fraction (HCC)    Addison disease (HCC)    CAD (coronary artery disease)    a. 01/2011 Anterior STEMI/Cath/PCI: LM nl, LAD 100d (3.5x35mm Vision BMS placed), LCX 51m, RI 50, RCA min irregs, EF 40% w/ apical, inferoapical HK; b. 10/2022 NSTSEMI/Cath: LM nl, LAD 5p/m, RI 85, LCX 165m, OM1 mild dzs, OM3 100 fills via L->L collats from dLAD, RCA 80p, 184m, RPDA fills via collats from LAD. EF 45-50%-->Med Rx.   CAD, multiple vessel    Cardiac arrest - ventricular fibrillation    a. In setting of STEMI 01/2011   Chronic, continuous use of opioids    Cocaine abuse (HCC)    COPD (chronic obstructive pulmonary disease) (HCC)    Depression with anxiety    ETOH abuse    a. 6-12 beers/day   GERD (gastroesophageal reflux disease)    GI bleed    Hemorrhoids    Hyperlipidemia    Hypertension    Ischemic cardiomyopathy    a. 06/2011 Echo: EF 45-50%, No rwma; b. 10/2022 Echo: EF 55-60%, no rwma, nl RV fxn.   Liver cirrhosis, alcoholic (HCC)    Marijuana abuse    Migraines    Noncompliance with immunization regimen    Obesity (BMI 30.0-34.9)    Orthostatic hypertension    midodrine   Portal hypertension (HCC)    s/p TIPS   STEMI (ST elevation myocardial infarction) (HCC) 2012   Tobacco abuse    a. 1/2 ppd x 26 yrs     Social History   Socioeconomic History   Marital status: Single    Spouse name: Not on file   Number of children: 2   Years of education: Not on file   Highest education level: 7th grade  Occupational History   Not on file  Tobacco Use    Smoking status: Every Day    Current packs/day: 0.50    Types: Cigarettes   Smokeless tobacco: Never   Tobacco comments:    started age 26  Vaping Use   Vaping status: Never Used  Substance and Sexual Activity   Alcohol use: Yes    Alcohol/week: 2.0 standard drinks of alcohol    Types: 2 Cans of beer per week    Comment: 2 wine coolers per day   Drug use: Yes    Types: Cocaine   Sexual activity: Not Currently  Other Topics Concern   Not on file  Social History Narrative   Single, lives in [place] with his mother.  He does not routinely exercise.  He sometimes works, helping to deliver appliances to homes.   Social Drivers of Corporate investment banker Strain: Low Risk  (12/15/2023)   Overall Financial Resource Strain (CARDIA)    Difficulty of Paying Living Expenses: Not hard at all  Food Insecurity: No Food Insecurity (12/13/2023)   Hunger Vital Sign    Worried About Running Out of Food in the Last Year: Never true    Ran Out of Food in the Last Year: Never true  Transportation Needs: No Transportation Needs (12/15/2023)   PRAPARE - Transportation  Lack of Transportation (Medical): No    Lack of Transportation (Non-Medical): No  Recent Concern: Transportation Needs - Unmet Transportation Needs (12/13/2023)   PRAPARE - Administrator, Civil Service (Medical): Yes    Lack of Transportation (Non-Medical): Yes  Physical Activity: Not on file  Stress: Not on file  Social Connections: Not on file   Education Assessment and Provision:  Detailed education and instructions provided on heart failure disease management including the following:  Signs and symptoms of Heart Failure When to call the physician Importance of daily weights Low sodium diet Fluid restriction Medication management Anticipated future follow-up appointments  Patient education given on each of the above topics.  Patient acknowledges understanding via teach back method and acceptance  of all instructions.  Education Materials:  "Living Better With Heart Failure" Booklet, HF zone tool, & Daily Weight Tracker Tool.  Patient has scale at home: Yes Patient has pill box at home: No-I will provide him with one.    High Risk Criteria for Readmission and/or Poor Patient Outcomes: Heart failure hospital admissions (last 6 months): 1  No Show rate: 11 Difficult social situation: Drug & ETOH use.  His car is currently not running but said he could get [facility].  I also provided him with [facility]. Demonstrates medication adherence: Not currently.  Charlett Nose, CPhT Setting up mediations to be mailed to his home address. Primary Language: English Literacy level: Reading, Writing & Comprehension  Barriers of Care:   Diet & Fluid Restrictions Daily Weights Medication Compliance  Considerations/Referrals:   Referral made to Heart Failure Pharmacist Stewardship: Yes- for medications in the mail delivered to his home & Meds to bed for discharge. Referral made to Heart Failure CSW/NCM TOC: No-I provided patient with [employer]. Referral made to Heart & Vascular TOC clinic: Yes- 12/24/23 @ 1:00 PM  Items for Follow-up on DC/TOC: DIET & FLUID RESTRICTIONS DAILY WEIGHTS MEDICATION COMPLIANCE CONTINUED HEART FAILURE EDUCATION NICOTINE, ETOH AND DRUG CESSATION   Roxy Horseman, RN, BSN [facility] Heart Failure Navigator Secure Chat Only

## 2023-12-15 NOTE — Care Management Important Message (Signed)
Important Message  Patient Details  Name: Ethan Wells MRN: [id number] Date of Birth: [birth year]   Important Message Given:  Yes - Medicare IM     Sondra Blixt W, CMA 12/15/2023, 3:16 PM

## 2023-12-15 NOTE — Discharge Instructions (Signed)
 Some PCP options in [place] area- not a comprehensive list  [facility]- [phone] [facility]- [phone] [facility]- [phone] [facility]- [phone] [facility]- [phone] Lut[facility] Horsfall- [phone]  or [facility] Physician Referral Line [phone]   Transportation Resources  Agency Name: [facility] Agency Address: [address] [place], [place] [address] Phone: [phone] Email: [email or web address] Website: [email or web address] Service(s) Offered: Housing services, self-sufficiency, congregate meal program, weatherization program, Field seismologist program, emergency food assistance,  housing counseling, home ownership program, wheels-towork program.  Agency Name: [facility] [employer] [phone]) Address: [address] [place], [place] [address] Phone: [phone] Website: [email or web address] Service(s) Offered: Transportation for [employer], subscription and demand response; Dial-a-Ride for citizens 25 years of age or older.  Agency Name: Department of Social Services Address: [address] [place], [place] [address] Phone: [phone] Service(s) Offered: Child support services; child welfare services; food stamps; Medicaid; work first family assistance; and aid with fuel,  rent, food and medicine, transportation assistance.  Agency Name: Disabled [employer] (DAV) Transportation  Network Phone: [phone] Service(s) Offered: Transports veterans to the [facility] medical center. Call  forty-eight hours in advance and leave the name, telephone  number, date, and time of appointment. Veteran will be  contacted by the driver the day before the appointment to  arrange a pick up point   Transportation Resources  Agency Name: [facility] Agency Address: [address] [place], [place] [address] Phone: [phone] Email:  [email or web address] Website: [email or web address] Service(s) Offered: Housing services, self-sufficiency, congregate meal program, weatherization program, Field seismologist program, emergency food assistance,  housing counseling, home ownership program, wheels-towork program.  Agency Name: [facility] [employer] [phone]) Address: [address] [place], [place] [address] Phone: [phone] Website: [email or web address] Service(s) Offered: Transportation for [employer], subscription and demand response; Dial-a-Ride for citizens 72 years of age or older.  Agency Name: Department of Social Services Address: [address] [place], [place] [address] Phone: [phone] Service(s) Offered: Child support services; child welfare services; food stamps; Medicaid; work first family assistance; and aid with fuel,  rent, food and medicine, transportation assistance.  Agency Name: Disabled [employer] (DAV) Transportation  Network Phone: [phone] Service(s) Offered: Transports veterans to the [facility] medical center. Call  forty-eight hours in advance and leave the name, telephone  number, date, and time of appointment. Veteran will be  contacted by the driver the day before the appointment to  arrange a pick up point    [employer] ACTA currently provides door to door services. ACTA connects with PART daily for services to [facility]. ACTA also performs contract services to [employer] operates 27 vehicles, all but 3 mini-vans are equipped with lifts for special needs as well as the general public. ACTA drivers are each CDL certified and trained in First Aid and CPR. ACTA was established in 2002 by [employer]. An independent Industrial/product designer. ACTA operates via Cytogeneticist with required Research scientist (physical sciences) from [place]. ACTA provides over 80,000 passenger trips each year, including [facility] and [employer] sites.  Call at least by 11 AM one business day prior to needing transportation  [employer].                      [place], [place] [address]     Office Hours: Monday-Friday  8 AM - 5 PM

## 2023-12-15 NOTE — Progress Notes (Signed)
Pt has a car but not running right now.  Says he is able to get to follow-up appointment.  I will provide him with alternative  resources for [facility] as well.

## 2023-12-15 NOTE — Telephone Encounter (Signed)
Patient Product/process development scientist completed.    The patient is insured through [employer]. Patient has Medicare and is not eligible for a copay card, but may be able to apply for patient assistance, if available.    Ran test claim for Farxiga 10 mg and the current 30 day co-pay is $0.00.  Ran test claim for Jardiance 10 mg and the current 30 day co-pay is $0.00.  This test claim was processed through [facility]- copay amounts may vary at other pharmacies due to pharmacy/plan contracts, or as the patient moves through the different stages of their insurance plan.     Roland Earl, CPHT Pharmacy Technician III Certified Patient Advocate [facility] Patient Advocate Team Direct Number: [phone]  Fax: [phone]

## 2023-12-15 NOTE — TOC Initial Note (Signed)
Transition of Care [facility]) - Initial/Assessment Note    Patient Details  Name: Ethan Wells MRN: [id number] Date of Birth: [birth year]  Transition of Care [facility]) CM/SW Contact:    Margarito Liner, LCSW Phone Number: 12/15/2023, 2:59 PM  Clinical Narrative:   Readmission prevention screen complete. CSW met with patient. No supports at bedside. CSW introduced role and explained that discharge planning would be discussed. Patient no longer has a PCP. CSW added list to AVS. If his insurance company cannot transport him, he drives himself. CSW added transportation resources to his AVS. [facility] [address] and in [place]. Patient lives with his mother and cousin. No home health prior to admission. He has a SPC and rollator at home. No further concerns. CSW encouraged patient to contact CSW as needed. CSW will continue to follow patient for support and facilitate return home at discharge. He will likely need a cab home at discharge: [address], [place], [place] [address].              Expected Discharge Plan: Home/Self Care Barriers to Discharge: Continued Medical Work up   Patient Goals and CMS Choice            Expected Discharge Plan and Services     Post Acute Care Choice: NA Living arrangements for the past 2 months: Single Family Home                                      Prior Living Arrangements/Services Living arrangements for the past 2 months: Single Family Home Lives with:: Parents, Relatives Patient language and need for interpreter reviewed:: Yes Do you feel safe going back to the place where you live?: Yes      Need for Family Participation in Patient Care: Yes (Comment) Care giver support system in place?: Yes (comment)   Criminal Activity/Legal Involvement Pertinent to Current Situation/Hospitalization: No - Comment as needed  Activities of Daily Living   ADL Screening (condition at time of admission) Independently performs ADLs?:  Yes (appropriate for developmental age) Is the patient deaf or have difficulty hearing?: No Does the patient have difficulty seeing, even when wearing glasses/contacts?: No Does the patient have difficulty concentrating, remembering, or making decisions?: No  Permission Sought/Granted                  Emotional Assessment Appearance:: Appears stated age Attitude/Demeanor/Rapport: Engaged Affect (typically observed): Accepting, Appropriate, Calm, Pleasant Orientation: : Oriented to Self, Oriented to Place, Oriented to  Time, Oriented to Situation Alcohol / Substance Use: Not Applicable Psych Involvement: No (comment)  Admission diagnosis:  NSTEMI (non-ST elevated myocardial infarction) [facility]) [I21.4] Patient Active Problem List   Diagnosis Date Noted   NSTEMI (non-ST elevated myocardial infarction) (HCC) 12/12/2023   Essential hypertension 12/12/2023   Precordial chest pain 11/23/2023   COPD (chronic obstructive pulmonary disease) (HCC) 11/23/2023   COPD exacerbation (HCC) 10/16/2023   Hepatic cirrhosis (HCC) 09/03/2023   Acute metabolic encephalopathy 08/11/2023   Acute blood loss anemia (ABLA) 08/08/2023   Elevated liver function tests 07/02/2023   Pancytopenia (HCC) 07/01/2023   Metabolic acidosis 07/01/2023   Liver mass 06/30/2023   Nausea vomiting and diarrhea 06/30/2023   Migraine 06/30/2023   Cholecystitis, acute 05/10/2023   UTI (urinary tract infection) 05/05/2023   Chronic diastolic CHF (congestive heart failure) (HCC) 05/05/2023   Alcohol abuse 05/05/2023   Alcoholic  cirrhosis (HCC) 04/12/2023   Esophageal varices with bleeding in diseases classified elsewhere (HCC) 04/09/2023   Hepatic encephalopathy (HCC) 04/09/2023   Secondary esophageal varices with bleeding (HCC) 04/08/2023   Upper GI bleeding 04/07/2023   Leg edema 03/01/2023   GI bleeding 02/22/2023   HLD (hyperlipidemia) 02/22/2023   Adrenal insufficiency (HCC) 02/22/2023   Alcoholic liver disease  (HCC) 42/59/5638   Upper GI bleed 02/22/2023   Myocardial injury 01/29/2023   Adrenal insufficiency (Addison's disease) (HCC) 01/28/2023   Reactive thrombocytosis 01/24/2023   Hypophosphatemia 01/22/2023   Acute hypoxic respiratory failure (HCC) 01/20/2023   Orthostatic hypotension 01/17/2023   Anasarca 01/17/2023   Hemorrhagic shock (HCC) 01/17/2023   Obesity (BMI 30-39.9) 01/17/2023   Cocaine dependence with cocaine-induced disorder (HCC) 01/17/2023   Rectal bleeding 01/17/2023   Peripheral edema 01/17/2023   History of benzodiazepine use 01/17/2023   Chest pain 11/19/2022   Acute on chronic heart failure with preserved ejection fraction (HFpEF) (HCC) 10/29/2022   Acute respiratory distress 10/29/2022   Elevated rheumatoid factor 10/28/2022   Hyperkalemia 10/25/2022   Hemorrhoids, internal, with bleeding 10/25/2022   Mesenteric artery stenosis (HCC) 10/23/2022   Iron deficiency anemia 10/23/2022   Hematochezia    Abnormal CT scan, colon    Colitis 09/30/2022   Prolonged QT interval 09/30/2022   Alcohol use disorder 08/19/2022   GI bleed 05/28/2022   Non-intractable vomiting    Epigastric pain    Polyp of ascending colon    Anemia due to GI blood loss 08/07/2021   Acute gastritis    Hyperbilirubinemia    Elevated troponin    Notalgia    History of ST elevation myocardial infarction (STEMI) 12/19/2020   Alcohol dependence (HCC) 12/19/2020   Depression with anxiety 10/05/2020   Esophageal reflux 10/05/2020   Headache 10/05/2020   Hyperlipidemia 10/05/2020   Hyponatremia 06/04/2020   Hypomagnesemia 06/04/2020   Chronic systolic CHF (congestive heart failure) (HCC) 06/04/2020   Leukocytosis 06/04/2020   Abdominal pain 06/04/2020   Hypokalemia 06/04/2020   Ischemic cardiomyopathy    Polysubstance abuse (HCC)    Demand ischemia (HCC) 07/12/2019   Abdominal pain, RUQ 05/25/2018   Chest pain, mid sternal 11/17/2012   Cocaine abuse (HCC)    Marijuana abuse    Tobacco  abuse 02/26/2011   Coronary artery disease involving native coronary artery of native heart with angina pectoris (HCC) 02/26/2011   Other specified forms of chronic ischemic heart disease 02/26/2011   PCP:  Pcp, No [facility]acy[facility]   [facility] [address] [place] [facility] [address] [place] [address] Phone: [phone] Fax: [phone]  [facility] [address] [place] [address] Phone: [phone] Fax: [phone]     Social Drivers of Health (SDOH) Social History: SDOH Screenings   Food Insecurity: No Food Insecurity (12/13/2023)  Housing: Low Risk  (12/15/2023)  Transportation Needs: No Transportation Needs (12/15/2023)  Recent Concern: Transportation Needs - Unmet Transportation Needs (12/13/2023)  Utilities: Not At Risk (12/15/2023)  Alcohol Screen: Medium Risk (10/01/2022)  Financial Resource Strain: Low Risk  (12/15/2023)  Tobacco Use: High Risk (12/15/2023)   SDOH Interventions: Housing Interventions: Intervention Not Indicated (Pt has housing right now) Transportation Interventions: Intervention Not Indicated Utilities Interventions: Intervention Not Indicated Alcohol Usage Interventions: Intervention Not Indicated (Score <7) Financial Strain Interventions: Intervention Not Indicated   Readmission Risk Interventions    12/15/2023    2:55 PM 10/16/2023   12:56 PM 08/11/2023   10:35  AM  Readmission Risk Prevention Plan  Transportation Screening Complete Complete Complete  Medication Review Oceanographer) Complete Complete Complete  PCP or Specialist appointment within 3-5 days of discharge Complete Complete Complete  HRI or Home Care Consult  Patient refused   SW Recovery Care/Counseling Consult Complete Patient refused Complete  Palliative Care Screening Not Applicable Patient Refused Not Applicable  Skilled Nursing Facility Not Applicable Patient Refused Not Applicable

## 2023-12-15 NOTE — Progress Notes (Signed)
Progress Note   [name] ZOX:[id number] DOB: 12-May-1971 DOA: 12/12/2023     3 DOS: the patient was seen and examined on 12/15/2023   Brief hospital course: LUTHUR LINDMARK is a 52 y.o. male with a hx of coronary artery disease with prior STEMI and VF arrest in 01/2011 with LAD stenting, NSTEMI in 10/2022, ischemic cardiomyopathy, cirrhosis status post TIPS, polysubstance abuse, recurrent GI bleeding, hypertension, hyperlipidemia, mesenteric disease status post SMA stenting, presented for for the evaluation of chest pain.  Patient is started on heparin drip per NSTEMI protocol.  Echocardiogram ordered.  His UDS is positive for cocaine.  Cardiology evaluated advised no beta-blocker or nitrates due to low blood pressures, not on aspirin due to GI bleed, further management pending echo.  Assessment and Plan: * NSTEMI (non-ST elevated myocardial infarction) (HCC) Ischemic cardiomyopathy-EF recovered. H/o of CAD status post PCI to LAD, hyperlipidemia, hypertension Continue heparin per pharmacy protocol. Complete echo pending. Troponin trending down. Echo showed preserved EF, no wall motion abnormalities. Cardiology follow-up appreciated. Patient has chronic anemia and ongoing bleeding and so IV heparin is stopped.  Patient is not a candidate for cardiac cath at this time.  Continue medical management. UDS + for cocaine 12/26, no beta blocker. Can't give Nitrates due to low BP. Continue Statin. Not on aspirin due to GI bleed. Morphine changed to percocet.  Polysubstance abuse (HCC) UDS positive for cocaine. Advised to quit illicit drug. Smoking cessation advised. Advised to strop drinking alcohol.  Tobacco abuse Continue nicotine patch. Advised to quit smoking.  Chronic Hypotension BP lower side Caution with antihypertensives. Continue low dose midodrine therapy.  COPD (chronic obstructive pulmonary disease) (HCC) Does not appear to be in acute  exacerbation DuoNebs every 6 hours as needed for wheezing, shortness of breath, 4 days ordered  Chronic diastolic CHF Stable. Last Echo 2/24 EF 50%. Will resume Lasix if BP better. Monitor daily wrights, strict ins and output.  Hyperbilirubinemia Appears stable over the last 2 months  Hypokalemia- Improved.  Chronic Anemia- Stable. Baseline hemoglobin around 8. Monitor for bleeding as he is on heparin drip.    Out of bed to chair. Incentive spirometry. Nursing supportive care. Fall, aspiration precautions. DVT prophylaxis   Code Status: Full Code  Subjective: Patient is seen and examined today morning.  He still complains of chest pain. States morphine works, advised oral pain meds. Advised out of bed.  Physical Exam: Vitals:   12/15/23 0433 12/15/23 0751 12/15/23 1224 12/15/23 1602  BP:  (!) 107/53 (!) 96/42 (!) 96/50  Pulse:  69 70 72  Resp:    18  Temp:  98 F (36.7 C) 97.9 F (36.6 C) 98 F (36.7 C)  TempSrc:      SpO2:  100% 98% 99%  Weight: 87.7 kg     Height:        General - Middle aged Caucasian obese male, in distress due to pain HEENT - PERRLA, EOMI, atraumatic head, non tender sinuses. Lung - Clear, basal rales, rhonchi, no wheezes. Heart - S1, S2 heard, no murmurs, rubs, trace pedal edema. Abdomen - Soft, non tender, obese, bowel sounds good Neuro - Alert, awake and oriented x 3, non focal exam. Skin - Warm and dry.  Data Reviewed:      Latest Ref Rng & Units 12/15/2023    2:47 AM 12/14/2023    3:59 AM 12/13/2023   12:13 PM  CBC  WBC 4.0 - 10.5 K/uL 4.3  4.4  5.6  Hemoglobin 13.0 - 17.0 g/dL 9.2  8.5  8.7   Hematocrit 39.0 - 52.0 % 27.3  26.2  26.5   Platelets 150 - 400 K/uL 108  102  111       Latest Ref Rng & Units 12/13/2023    4:03 AM 12/12/2023    9:00 AM 12/11/2023   10:28 PM  BMP  Glucose 70 - 99 mg/dL 161  096  045   BUN 6 - 20 mg/dL 10  8  6    Creatinine 0.61 - 1.24 mg/dL 4.09  8.11  9.14   Sodium 135 - 145 mmol/L 136   137  137   Potassium 3.5 - 5.1 mmol/L 3.8  3.4  3.4   Chloride 98 - 111 mmol/L 107  105  106   CO2 22 - 32 mmol/L 22  20  16    Calcium 8.9 - 10.3 mg/dL 8.1  8.7  8.8    No results found.   Family Communication: Discussed with patient he understand and agree. All questions answereed.  Disposition: Status is: Inpatient Remains inpatient appropriate because: pain control, safe dc plan.  Planned Discharge Destination: Home with Home Health     Time spent: 36 minutes  Author: Marcelino Duster, MD 12/15/2023 4:27 PM Secure chat 7am to 7pm For on call review [email or web address].

## 2023-12-15 NOTE — Progress Notes (Signed)
Rounding Note    Patient Name: Ethan Wells Date of Encounter: 12/15/2023  [facility] Cardiologist: Verne Carrow, MD   Subjective   Patient reports ongoing and constant chest pain at rest which he states is only relieved with pain medication. Endorses some shortness of breath secondary to his chest pain. Denies palpitations, diaphoresis, nausea, and vomiting. Imdur was trialed yesterday with no improvement in symptoms and hypotension, was d/c'd.   Inpatient Medications    Scheduled Meds:  enoxaparin (LOVENOX) injection  40 mg Subcutaneous Q24H   midodrine  2.5 mg Oral TID WC   pantoprazole (PROTONIX) IV  40 mg Intravenous Q12H   rosuvastatin  40 mg Oral Daily   Continuous Infusions:  PRN Meds: diphenhydrAMINE, docusate sodium, hydrALAZINE, ipratropium-albuterol, LORazepam, methocarbamol, nicotine, ondansetron **OR** ondansetron (ZOFRAN) IV, mouth rinse, oxyCODONE-acetaminophen   Vital Signs    Vitals:   12/14/23 2351 12/15/23 0347 12/15/23 0433 12/15/23 0751  BP: (!) 104/55 (!) 100/51  (!) 107/53  Pulse: 66 72  69  Resp: 17 16    Temp: 98.1 F (36.7 C)   98 F (36.7 C)  TempSrc: Oral     SpO2: 98% 98%  100%  Weight:   87.7 kg   Height:        Intake/Output Summary (Last 24 hours) at 12/15/2023 1019 Last data filed at 12/15/2023 0700 Gross per 24 hour  Intake --  Output 2400 ml  Net -2400 ml      12/15/2023    4:33 AM 12/12/2023    8:43 AM 12/11/2023   10:24 PM  Last 3 Weights  Weight (lbs) 193 lb 5.5 oz 191 lb 191 lb  Weight (kg) 87.7 kg 86.637 kg 86.637 kg      Telemetry    Sinus rhythm with occasional PVCs, rate 60-80 bpm - Personally Reviewed  Physical Exam   GEN: No acute distress.   Neck: No JVD Cardiac: RRR, no murmurs, rubs, or gallops.  Respiratory: Clear to auscultation bilaterally. GI: Soft, nontender, non-distended  MS: No edema; No deformity. Neuro:  Nonfocal  Psych: Normal affect   Labs    High  Sensitivity Troponin:   Recent Labs  Lab 12/11/23 2228 12/12/23 0900 12/12/23 1035 12/12/23 1454 12/12/23 1941  TROPONINIHS 14 256* 270* 313* 287*     Chemistry Recent Labs  Lab 12/11/23 2228 12/12/23 0900 12/13/23 0403  NA 137 137 136  K 3.4* 3.4* 3.8  CL 106 105 107  CO2 16* 20* 22  GLUCOSE 146* 131* 103*  BUN 6 8 10   CREATININE 0.59* 0.62 0.64  CALCIUM 8.8* 8.7* 8.1*  MG  --  1.5*  --   PROT  --  7.0  --   ALBUMIN  --  3.0*  --   AST  --  56*  --   ALT  --  16  --   ALKPHOS  --  165*  --   BILITOT  --  3.0*  --   GFRNONAA >60 >60 >60  ANIONGAP 15 12 7     Lipids  Recent Labs  Lab 12/14/23 0359  CHOL 165  TRIG 62  HDL 27*  LDLCALC 126*  CHOLHDL 6.1    Hematology Recent Labs  Lab 12/13/23 1213 12/14/23 0359 12/15/23 0247  WBC 5.6 4.4 4.3  RBC 2.74* 2.72* 2.87*  HGB 8.7* 8.5* 9.2*  HCT 26.5* 26.2* 27.3*  MCV 96.7 96.3 95.1  MCH 31.8 31.3 32.1  MCHC 32.8 32.4 33.7  RDW 16.5*  16.5* 16.3*  PLT 111* 102* 108*   Thyroid No results for input(s): "TSH", "FREET4" in the last 168 hours.  BNPNo results for input(s): "BNP", "PROBNP" in the last 168 hours.  DDimer No results for input(s): "DDIMER" in the last 168 hours.   Radiology    ECHOCARDIOGRAM COMPLETE Result Date: 12/13/2023    ECHOCARDIOGRAM REPORT   Patient Name:   Ethan Wells Date of Exam: 12/13/2023 Medical Rec #:  [id number]           Height:       65.0 in Accession #:    [id number]          Weight:       191.0 lb Date of Birth:  June 07, 1971           BSA:          1.940 m Patient Age:    52 years            BP:           118/67 mmHg Patient Gender: M                   HR:           72 bpm. Exam Location:  [facility] Procedure: 2D Echo, 3D Echo, Cardiac Doppler and Color Doppler Indications:     Elevated Troponin                  Chest Pain R07.9  History:         Patient has prior history of Echocardiogram examinations, most                  recent 04/11/2023. Cardiomyopathy and CHF, CAD and Previous                   Myocardial Infarction, COPD and Migraines, Signs/Symptoms:Chest                  Pain; Risk Factors:Hypertension, Dyslipidemia and Current                  Smoker.  Sonographer:     Lucendia Herrlich RCS Referring Phys:  [id number] AMY N COX Diagnosing Phys: Chilton Si MD IMPRESSIONS  1. Left ventricular ejection fraction, by estimation, is 50 to 55%. Left ventricular ejection fraction by 3D volume is 54 %. The left ventricle has low normal function. The left ventricle has no regional wall motion abnormalities. Left ventricular diastolic parameters are consistent with Grade I diastolic dysfunction (impaired relaxation).  2. Right ventricular systolic function is normal. The right ventricular size is normal. There is mildly elevated pulmonary artery systolic pressure.  3. The mitral valve is normal in structure. No evidence of mitral valve regurgitation. No evidence of mitral stenosis.  4. The aortic valve is tricuspid. Aortic valve regurgitation is not visualized. No aortic stenosis is present.  5. The inferior vena cava is normal in size with greater than 50% respiratory variability, suggesting right atrial pressure of 3 mmHg. FINDINGS  Left Ventricle: Left ventricular ejection fraction, by estimation, is 50 to 55%. Left ventricular ejection fraction by 3D volume is 54 %. The left ventricle has low normal function. The left ventricle has no regional wall motion abnormalities. The left ventricular internal cavity size was normal in size. There is no left ventricular hypertrophy. Left ventricular diastolic parameters are consistent with Grade I diastolic dysfunction (impaired relaxation). Right Ventricle: The right ventricular size is normal. No increase in right  ventricular wall thickness. Right ventricular systolic function is normal. There is mildly elevated pulmonary artery systolic pressure. The tricuspid regurgitant velocity is 2.94  m/s, and with an assumed right atrial pressure of 3 mmHg,  the estimated right ventricular systolic pressure is 37.6 mmHg. Left Atrium: Left atrial size was normal in size. Right Atrium: Right atrial size was normal in size. Pericardium: There is no evidence of pericardial effusion. Mitral Valve: The mitral valve is normal in structure. No evidence of mitral valve regurgitation. No evidence of mitral valve stenosis. Tricuspid Valve: The tricuspid valve is normal in structure. Tricuspid valve regurgitation is trivial. No evidence of tricuspid stenosis. Aortic Valve: The aortic valve is tricuspid. Aortic valve regurgitation is not visualized. No aortic stenosis is present. Aortic valve peak gradient measures 7.3 mmHg. Pulmonic Valve: The pulmonic valve was normal in structure. Pulmonic valve regurgitation is not visualized. No evidence of pulmonic stenosis. Aorta: The aortic root is normal in size and structure. Venous: The inferior vena cava is normal in size with greater than 50% respiratory variability, suggesting right atrial pressure of 3 mmHg. IAS/Shunts: No atrial level shunt detected by color flow Doppler.  LEFT VENTRICLE PLAX 2D LVIDd:         6.00 cm         Diastology LVIDs:         4.30 cm         LV e' medial:    5.98 cm/s LV PW:         0.90 cm         LV E/e' medial:  11.3 LV IVS:        0.80 cm         LV e' lateral:   9.03 cm/s LVOT diam:     2.20 cm         LV E/e' lateral: 7.5 LV SV:         75 LV SV Index:   39 LVOT Area:     3.80 cm        3D Volume EF                                LV 3D EF:    Left                                             ventricul                                             ar                                             ejection                                             fraction  by 3D                                             volume is                                             54 %.                                 3D Volume EF:                                3D EF:        54 %                                 LV EDV:       225 ml                                LV ESV:       103 ml                                LV SV:        122 ml RIGHT VENTRICLE             IVC RV S prime:     14.60 cm/s  IVC diam: 1.70 cm TAPSE (M-mode): 2.9 cm LEFT ATRIUM             Index        RIGHT ATRIUM           Index LA diam:        4.30 cm 2.22 cm/m   RA Area:     14.70 cm LA Vol (A2C):   47.7 ml 24.59 ml/m  RA Volume:   32.80 ml  16.91 ml/m LA Vol (A4C):   55.1 ml 28.40 ml/m LA Biplane Vol: 54.0 ml 27.84 ml/m  AORTIC VALVE AV Area (Vmax): 2.84 cm AV Vmax:        135.00 cm/s AV Peak Grad:   7.3 mmHg LVOT Vmax:      101.00 cm/s LVOT Vmean:     65.700 cm/s LVOT VTI:       0.198 m  AORTA Ao Root diam: 3.40 cm Ao Asc diam:  3.00 cm MITRAL VALVE               TRICUSPID VALVE MV Area (PHT): 3.91 cm    TR Peak grad:   34.6 mmHg MV Decel Time: 194 msec    TR Vmax:        294.00 cm/s MV E velocity: 67.60 cm/s MV A velocity: 80.70 cm/s  SHUNTS MV E/A ratio:  0.84        Systemic VTI:  0.20 m                            Systemic Diam: 2.20 cm  Chilton Si MD Electronically signed by Chilton Si MD Signature Date/Time: 12/13/2023/5:44:27 PM    Final     Cardiac Studies   12/13/2023 TTE 1. Left ventricular ejection fraction, by estimation, is 50 to 55%. Left  ventricular ejection fraction by 3D volume is 54 %. The left ventricle has  low normal function. The left ventricle has no regional wall motion  abnormalities. Left ventricular  diastolic parameters are consistent with Grade I diastolic dysfunction  (impaired relaxation).   2. Right ventricular systolic function is normal. The right ventricular  size is normal. There is mildly elevated pulmonary artery systolic  pressure.   3. The mitral valve is normal in structure. No evidence of mitral valve  regurgitation. No evidence of mitral stenosis.   4. The aortic valve is tricuspid. Aortic valve regurgitation is not  visualized. No aortic stenosis  is present.   5. The inferior vena cava is normal in size with greater than 50%  respiratory variability, suggesting right atrial pressure of 3 mmHg.   Patient Profile     52 y.o. male with hx CAD with prior STEMI and VF arrest 01/2011 with LAD stending, NSTEMI 10/2022, ischemic cardiomyopathy, cirrhosis s/p TIPS, polysubstance abuse, recurrent GI bleeding, chronic hypotension, HFpEF, hyperlipidemia, and mesenteric disease s/p SMA stenting is being seen for the continued evaluation of NSTEMI.   Assessment & Plan    NSTEMI CAD Chest pain - Presented on 12/26 with chest pain for several days and at rest - Prior heart cath 10/2022 showed severe two-vessel CAD with CTO to the mid left circumflex and CTO of the mid RCA, LAD stent patent, collaterals were noted, no intervention recommended at that time  - Troponin peaked at 313 on 12/27, likely demand ischemia in the setting of vasospasm from cocaine and alcohol use (similar instances in the past) - Echocardiogram 12/28 shows LVEF 50-55% without wall motion abnormalities - EKG without ischemic changes - No ASA in the setting of ongoing GI bleeding, no BB in the setting of hypotension - IV heparin was discontinued prior to complete 48 hours due to down-trending hemoglobin (8.5 yesterday) in the setting of chronic rectal bleeding - Imdur was trialed yesterday without improvement in symptoms and a significant drop in blood pressure and was discontinued - Question how much of chest pain is ischemic in nature, no plan for further ischemic evaluation at this time. Not a good candidate for cardiac catheterization at this time.  - Continue rosuvastatin 40 mg daily  Polysubstance abuse - UDS 12/26 positive for cocaine - Endorses use of alcohol and tobacco smoking - Recommend cessation  Chronic hypotension - BP improved some since yesterday although still soft with systolic 100s - Continue midodrine 2.5 mg TID  Chronic HFpEF - Echocardiogram 12/28  shows LVEF 50-55% with grade I diastolic dysfunction  Anemia  - Hgb 8.5>9.2 today  For questions or updates, please contact [facility] Please consult [email or web address] for contact info under        Signed, Orion Crook, PA-C  12/15/2023, 10:19 AM

## 2023-12-15 NOTE — Plan of Care (Signed)
  Problem: Education: Goal: Knowledge of General Education information will improve Description: Including pain rating scale, medication(s)/side effects and non-pharmacologic comfort measures Outcome: Progressing   Problem: Pain Management: Goal: General experience of comfort will improve Outcome: Progressing   Problem: Safety: Goal: Ability to remain free from injury will improve Outcome: Progressing   Problem: Activity: Goal: Capacity to carry out activities will improve Outcome: Progressing

## 2023-12-15 NOTE — Progress Notes (Signed)
PHARMACIST - PHYSICIAN COMMUNICATION  CONCERNING:  Enoxaparin (Lovenox) for DVT Prophylaxis   RECOMMENDATION: Patient was prescribed enoxaparin 40mg  q24 hours for VTE prophylaxis.   Filed Weights   12/12/23 0843 12/15/23 0433  Weight: 86.6 kg (191 lb) 87.7 kg (193 lb 5.5 oz)   Body mass index is 32.17 kg/m.  Estimated Creatinine Clearance: 111.3 mL/min (by C-G formula based on SCr of 0.64 mg/dL).  Based on [facility] policy patient is candidate for enoxaparin 0.5mg /kg TBW SQ every 24 hours based on BMI being >30.  DESCRIPTION: Pharmacy has adjusted enoxaparin dose per [facility] policy.  Patient is now receiving enoxaparin 45 mg every 24 hours.   Littie Deeds, PharmD Pharmacy Resident  12/15/2023 11:23 AM

## 2023-12-16 DIAGNOSIS — F1090 Alcohol use, unspecified, uncomplicated: Secondary | ICD-10-CM | POA: Diagnosis not present

## 2023-12-16 DIAGNOSIS — I214 Non-ST elevation (NSTEMI) myocardial infarction: Secondary | ICD-10-CM | POA: Diagnosis not present

## 2023-12-16 DIAGNOSIS — K7030 Alcoholic cirrhosis of liver without ascites: Secondary | ICD-10-CM | POA: Diagnosis not present

## 2023-12-16 DIAGNOSIS — E271 Primary adrenocortical insufficiency: Secondary | ICD-10-CM | POA: Diagnosis not present

## 2023-12-16 MED ORDER — OXYCODONE-ACETAMINOPHEN 5-325 MG PO TABS
1.0000 | ORAL_TABLET | Freq: Four times a day (QID) | ORAL | Status: DC | PRN
  Administered 2023-12-16 – 2023-12-18 (×7): 1 via ORAL
  Filled 2023-12-16 (×7): qty 1

## 2023-12-16 MED ORDER — LORAZEPAM 2 MG/ML IJ SOLN
1.0000 mg | Freq: Four times a day (QID) | INTRAMUSCULAR | Status: AC | PRN
  Administered 2023-12-16 – 2023-12-17 (×3): 1 mg via INTRAVENOUS
  Filled 2023-12-16 (×3): qty 1

## 2023-12-16 MED ORDER — ALUM & MAG HYDROXIDE-SIMETH 200-200-20 MG/5ML PO SUSP
30.0000 mL | Freq: Four times a day (QID) | ORAL | Status: DC | PRN
  Administered 2023-12-16: 30 mL via ORAL
  Filled 2023-12-16: qty 30

## 2023-12-16 MED ORDER — BISACODYL 10 MG RE SUPP: 10.0000 mg | Freq: Once | RECTAL | Status: DC

## 2023-12-16 NOTE — Plan of Care (Signed)
   Problem: Education: Goal: Knowledge of General Education information will improve Description: Including pain rating scale, medication(s)/side effects and non-pharmacologic comfort measures Outcome: Progressing   Problem: Health Behavior/Discharge Planning: Goal: Ability to manage health-related needs will improve Outcome: Progressing   Problem: Nutrition: Goal: Adequate nutrition will be maintained Outcome: Progressing

## 2023-12-16 NOTE — Progress Notes (Signed)
 Progress Note   [name] FMW:[id number] DOB: Oct 05, 1971 DOA: 12/12/2023     4 DOS: the patient was seen and examined on 12/16/2023   Brief hospital course: Ethan Wells is a 52 y.o. male with a hx of coronary artery disease with prior STEMI and VF arrest in 01/2011 with LAD stenting, NSTEMI in 10/2022, ischemic cardiomyopathy, cirrhosis status post TIPS, polysubstance abuse, recurrent GI bleeding, hypertension, hyperlipidemia, mesenteric disease status post SMA stenting, presented for for the evaluation of chest pain.  Patient is started on heparin  drip per NSTEMI protocol.  Echocardiogram ordered.  His UDS is positive for cocaine .  Cardiology evaluated advised no beta-blocker or nitrates due to low blood pressures, not on aspirin  due to GI bleed. Echo showed preserved EF, Cardiologist did not recommend further cardiac work up. He complains of persistent chest pain.   Assessment and Plan: * NSTEMI (non-ST elevated myocardial infarction) (HCC) Ischemic cardiomyopathy-EF recovered. H/o of CAD status post PCI to LAD, hyperlipidemia, hypertension Troponin trending down. Echo showed preserved EF, no wall motion abnormalities. Heparin  drip stopped due to drop in h/h and high risk for GI bleed. Patient is not a candidate for cardiac cath at this time.  Continue medical management. UDS + for cocaine  12/26, no beta blocker. Can't give Nitrates due to low BP. Continue Statin. Not on aspirin  due to GI bleed. Pain seems more epigastric, maalox, PPI, constipation regimen advised.  Polysubstance abuse (HCC) UDS positive for cocaine . Advised to quit illicit drug. Smoking cessation advised. Advised to strop drinking alcohol . Advised to limit opiates.  Tobacco abuse Continue nicotine  patch. Advised to quit smoking.  Chronic Hypotension BP lower side Caution with antihypertensives. Continue low dose midodrine  therapy.  COPD (chronic obstructive pulmonary disease)  (HCC) Does not appear to be in acute exacerbation DuoNebs every 6 hours as needed for wheezing, shortness of breath, 4 days ordered  Chronic diastolic CHF Stable. Last Echo 2/24 EF 50%. Will resume Lasix  if BP better. Monitor daily wrights, strict ins and output.  Hyperbilirubinemia Appears stable over the last 2 months  Hypokalemia- Improved.  Chronic Anemia- Stable. Baseline hemoglobin around 8. Monitor for bleeding as he is on heparin  drip.  Obesity class 1 BMI 31.44 Diet, exercise and weight reduction advised.    Out of bed to chair. Incentive spirometry. Nursing supportive care. Fall, aspiration precautions. DVT prophylaxis   Code Status: Full Code  Subjective: Patient is seen and examined today morning.  He still complains of epigastric pain more like gas discomfort. Has constipation, last bm 5 days ago. Advised out of bed, suppository ordered. RN notified that he does not want suppository.  Wishes to take stool softeners.  Physical Exam: Vitals:   12/16/23 0500 12/16/23 0827 12/16/23 1151 12/16/23 1600  BP:  (!) 94/46 (!) 95/47 (!) 92/48  Pulse:  63 60 66  Resp:      Temp:  97.9 F (36.6 C) 97.9 F (36.6 C) 97.9 F (36.6 C)  TempSrc:   Oral Oral  SpO2:  100% 99% 98%  Weight: 85.7 kg     Height:        General - Middle aged obese Caucasian obese male, mild distress due to pain HEENT - PERRLA, EOMI, atraumatic head, non tender sinuses. Lung - Clear, basal rales, rhonchi, no wheezes. Heart - S1, S2 heard, no murmurs, rubs, trace pedal edema. Abdomen - Soft, non tender, obese, bowel sounds good Neuro - Alert, awake and oriented x 3, non focal exam. Skin - Warm  and dry.  Data Reviewed:      Latest Ref Rng & Units 12/15/2023    2:47 AM 12/14/2023    3:59 AM 12/13/2023   12:13 PM  CBC  WBC 4.0 - 10.5 K/uL 4.3  4.4  5.6   Hemoglobin 13.0 - 17.0 g/dL 9.2  8.5  8.7   Hematocrit 39.0 - 52.0 % 27.3  26.2  26.5   Platelets 150 - 400 K/uL 108  102  111        Latest Ref Rng & Units 12/13/2023    4:03 AM 12/12/2023    9:00 AM 12/11/2023   10:28 PM  BMP  Glucose 70 - 99 mg/dL 896  868  853   BUN 6 - 20 mg/dL 10  8  6    Creatinine 0.61 - 1.24 mg/dL 9.35  9.37  9.40   Sodium 135 - 145 mmol/L 136  137  137   Potassium 3.5 - 5.1 mmol/L 3.8  3.4  3.4   Chloride 98 - 111 mmol/L 107  105  106   CO2 22 - 32 mmol/L 22  20  16    Calcium  8.9 - 10.3 mg/dL 8.1  8.7  8.8    No results found.   Family Communication: Discussed with patient he understand and agree. All questions answereed.  Disposition: Status is: Inpatient Remains inpatient appropriate because: pain control, safe dc plan.  Planned Discharge Destination: Home with Home Health     Time spent: 38 minutes  Author: Concepcion Riser, MD 12/16/2023 4:48 PM Secure chat 7am to 7pm For on call review [email or web address].

## 2023-12-17 DIAGNOSIS — F1090 Alcohol use, unspecified, uncomplicated: Secondary | ICD-10-CM | POA: Diagnosis not present

## 2023-12-17 DIAGNOSIS — K7030 Alcoholic cirrhosis of liver without ascites: Secondary | ICD-10-CM | POA: Diagnosis not present

## 2023-12-17 DIAGNOSIS — E271 Primary adrenocortical insufficiency: Secondary | ICD-10-CM | POA: Diagnosis not present

## 2023-12-17 DIAGNOSIS — I214 Non-ST elevation (NSTEMI) myocardial infarction: Secondary | ICD-10-CM | POA: Diagnosis not present

## 2023-12-17 DIAGNOSIS — K625 Hemorrhage of anus and rectum: Secondary | ICD-10-CM

## 2023-12-17 LAB — HEMOGLOBIN AND HEMATOCRIT, BLOOD
HCT: 30.7 % — ABNORMAL LOW (ref 39.0–52.0)
Hemoglobin: 9.9 g/dL — ABNORMAL LOW (ref 13.0–17.0)

## 2023-12-17 MED ORDER — ACETAMINOPHEN 325 MG PO TABS: 650.0000 mg | ORAL_TABLET | Freq: Four times a day (QID) | ORAL | Status: DC | PRN

## 2023-12-17 MED ORDER — BISACODYL 10 MG RE SUPP
10.0000 mg | Freq: Once | RECTAL | Status: AC
  Administered 2023-12-17: 10 mg via RECTAL
  Filled 2023-12-17: qty 1

## 2023-12-17 NOTE — Plan of Care (Signed)

## 2023-12-17 NOTE — Progress Notes (Signed)
 Progress Note   [name] [id number] DOB: 04/06/1971 DOA: 12/12/2023     5 DOS: the patient was seen and examined on 12/17/2023   Brief hospital course: Ethan Wells is a 53 y.o. male with a hx of coronary artery disease with prior STEMI and VF arrest in 01/2011 with LAD stenting, NSTEMI in 10/2022, ischemic cardiomyopathy, cirrhosis status post TIPS, polysubstance abuse, recurrent GI bleeding, hypertension, hyperlipidemia, mesenteric disease status post SMA stenting, presented for for the evaluation of chest pain.  Patient is started on heparin  drip per NSTEMI protocol.  Echocardiogram ordered.  His UDS is positive for cocaine .  Cardiology evaluated advised no beta-blocker or nitrates due to low blood pressures, not on aspirin  due to GI bleed. Echo showed preserved EF, Cardiologist did not recommend further cardiac work up. He complains of persistent chest pain.   Assessment and Plan: * NSTEMI (non-ST elevated myocardial infarction) (HCC) Ischemic cardiomyopathy-EF recovered. H/o of CAD status post PCI to LAD, hyperlipidemia, hypertension Troponin trending down. Echo showed preserved EF, no wall motion abnormalities. Heparin  drip stopped due to drop in h/h and high risk for GI bleed. Patient is not a candidate for cardiac cath at this time.  Continue medical management. UDS + for cocaine  12/26, no beta blocker. Can't give Nitrates due to low BP. Continue Statin. Not on aspirin  due to GI bleed. Pain seems more epigastric, maalox, PPI, constipation regimen advised.  Polysubstance abuse (HCC) UDS positive for cocaine . Advised to quit illicit drug. Smoking cessation advised. Advised to strop drinking alcohol . Advised to limit opiates.  Tobacco abuse Continue nicotine  patch. Advised to quit smoking.  Chronic Hypotension BP lower side Caution with antihypertensives. Continue low dose midodrine  therapy.  COPD (chronic obstructive pulmonary disease)  (HCC) Does not appear to be in acute exacerbation DuoNebs every 6 hours as needed for wheezing, shortness of breath, 4 days ordered  Chronic diastolic CHF Stable. Last Echo 2/24 EF 50%. Will resume Lasix  if BP better. Monitor daily wrights, strict ins and output.  Hyperbilirubinemia Appears stable over the last 2 months  Hypokalemia- Improved.  Chronic Anemia- Stable. Baseline hemoglobin around 8. He had prior GI bleed, check H/H given active bleeding. No anticoagulation.  Obesity class 1 BMI 31.44 Diet, exercise and weight reduction advised.    Out of bed to chair. Incentive spirometry. Nursing supportive care. Fall, aspiration precautions. DVT prophylaxis   Code Status: Full Code  Subjective: Patient is seen and examined today morning.  He still abdominal discomfort. Later in the afternoon he had bloody bowel movement per RN. H/H ordered.  Physical Exam: Vitals:   12/17/23 0500 12/17/23 0749 12/17/23 1143 12/17/23 1636  BP:  (!) 91/50 (!) 91/48 (!) 109/54  Pulse:  70 69 76  Resp:  16 16 20   Temp:  98 F (36.7 C) 98 F (36.7 C) 98.7 F (37.1 C)  TempSrc:  Oral Oral Oral  SpO2:  100% 98% 100%  Weight: 85.6 kg     Height:        General - Middle aged obese Caucasian obese male, mild distress due to pain HEENT - PERRLA, EOMI, atraumatic head, non tender sinuses. Lung - Clear, basal rales, rhonchi, no wheezes. Heart - S1, S2 heard, no murmurs, rubs, trace pedal edema. Abdomen - Soft, non tender, obese, bowel sounds good Neuro - Alert, awake and oriented x 3, non focal exam. Skin - Warm and dry.  Data Reviewed:      Latest Ref Rng & Units 12/17/2023  2:26 PM 12/15/2023    2:47 AM 12/14/2023    3:59 AM  CBC  WBC 4.0 - 10.5 K/uL  4.3  4.4   Hemoglobin 13.0 - 17.0 g/dL 9.9  9.2  8.5   Hematocrit 39.0 - 52.0 % 30.7  27.3  26.2   Platelets 150 - 400 K/uL  108  102       Latest Ref Rng & Units 12/13/2023    4:03 AM 12/12/2023    9:00 AM 12/11/2023    10:28 PM  BMP  Glucose 70 - 99 mg/dL 896  868  853   BUN 6 - 20 mg/dL 10  8  6    Creatinine 0.61 - 1.24 mg/dL 9.35  9.37  9.40   Sodium 135 - 145 mmol/L 136  137  137   Potassium 3.5 - 5.1 mmol/L 3.8  3.4  3.4   Chloride 98 - 111 mmol/L 107  105  106   CO2 22 - 32 mmol/L 22  20  16    Calcium  8.9 - 10.3 mg/dL 8.1  8.7  8.8    No results found.   Family Communication: Discussed with patient he understand and agree. All questions answereed.  Disposition: Status is: Inpatient Remains inpatient appropriate because: pain control, safe dc plan.  Planned Discharge Destination: Home with Home Health     Time spent: 38 minutes  Author: Concepcion Riser, MD 12/17/2023 5:29 PM Secure chat 7am to 7pm For on call review [email or web address]

## 2023-12-18 ENCOUNTER — Other Ambulatory Visit: Payer: Self-pay

## 2023-12-18 ENCOUNTER — Telehealth: Payer: Self-pay | Admitting: Neurosurgery

## 2023-12-18 ENCOUNTER — Other Ambulatory Visit (HOSPITAL_COMMUNITY): Payer: Self-pay

## 2023-12-18 DIAGNOSIS — Z720 Tobacco use: Secondary | ICD-10-CM

## 2023-12-18 DIAGNOSIS — E66811 Obesity, class 1: Secondary | ICD-10-CM

## 2023-12-18 DIAGNOSIS — I214 Non-ST elevation (NSTEMI) myocardial infarction: Secondary | ICD-10-CM | POA: Diagnosis not present

## 2023-12-18 DIAGNOSIS — F1090 Alcohol use, unspecified, uncomplicated: Secondary | ICD-10-CM | POA: Diagnosis not present

## 2023-12-18 DIAGNOSIS — F1429 Cocaine dependence with unspecified cocaine-induced disorder: Secondary | ICD-10-CM | POA: Diagnosis not present

## 2023-12-18 DIAGNOSIS — Z6831 Body mass index (BMI) 31.0-31.9, adult: Secondary | ICD-10-CM

## 2023-12-18 DIAGNOSIS — E271 Primary adrenocortical insufficiency: Secondary | ICD-10-CM | POA: Diagnosis not present

## 2023-12-18 DIAGNOSIS — E872 Acidosis, unspecified: Secondary | ICD-10-CM

## 2023-12-18 DIAGNOSIS — R7989 Other specified abnormal findings of blood chemistry: Secondary | ICD-10-CM

## 2023-12-18 DIAGNOSIS — E6609 Other obesity due to excess calories: Secondary | ICD-10-CM

## 2023-12-18 MED ORDER — DOCUSATE SODIUM 100 MG PO CAPS: 100.0000 mg | ORAL_CAPSULE | Freq: Two times a day (BID) | ORAL | Status: AC | PRN

## 2023-12-18 MED ORDER — ALUM & MAG HYDROXIDE-SIMETH 200-200-20 MG/5ML PO SUSP
30.0000 mL | Freq: Four times a day (QID) | ORAL | 0 refills | Status: AC | PRN
  Filled 2023-12-18: qty 355, 3d supply, fill #0

## 2023-12-18 NOTE — Progress Notes (Signed)
 Heart Failure Stewardship Pharmacy Note  PCP: Pcp, No PCP-Cardiologist: Lonni Cash, MD  HPI: Ethan Wells is a 53 y.o. male with CAD status post PCI to the LAD, adrenal insufficiency, hyperlipidemia, GERD, aortic atherosclerosis, cardiomegaly, pulmonary arterial hypertension, cirrhosis of the liver, portal hypertension status post TIPS, history of SMA stenting, obesity, history of polysubstance abuse, including cocaine , EtOH, tobacco use who presented with chest pain. UDS on admission was positive for cocaine  and alcohol  level was 252. Troponin on admission was 14 and trended up to 313 before trending back down. Patient reports stopping all medications 2 weeks prior to admission due to starting an antibiotic he believed to have drug interactions. Noted yesterday to have a bloody BM however H/H was stable.   Pertinent cardiac history: MI in 2012 s/p stenting to the LAD. In 06/2011 and 06/2019, LVEF was 45-50%. In 12/2020, LVEF improved to 55-60%. LHC in 10/2022 showed severe 2 vessel disease with collaterals. Most recent echo this admission showed LVEF of 50-55%, grade I diastolic dysfunction, and normal RV function. CT was negative for PE, but did show dilation of the pulmonary artery.   Pertinent Lab Values: Creat  Date Value Ref Range Status  09/23/2022 0.49 (L) 0.70 - 1.30 mg/dL Final    Comment:    Verified by repeat analysis. .    Creatinine, Ser  Date Value Ref Range Status  12/13/2023 0.64 0.61 - 1.24 mg/dL Final   BUN  Date Value Ref Range Status  12/13/2023 10 6 - 20 mg/dL Final  90/77/7985 4 (L) 7 - 18 mg/dL Final   Potassium  Date Value Ref Range Status  12/13/2023 3.8 3.5 - 5.1 mmol/L Final  09/06/2013 3.5 3.5 - 5.1 mmol/L Final   Sodium  Date Value Ref Range Status  12/13/2023 136 135 - 145 mmol/L Final  09/06/2013 134 (L) 136 - 145 mmol/L Final   B Natriuretic Peptide  Date Value Ref Range Status  09/03/2023 466.7 (H) 0.0 - 100.0 pg/mL Final     Comment:    Performed at [facility], [address] [place], [place] [address]   Magnesium   Date Value Ref Range Status  12/12/2023 1.5 (L) 1.7 - 2.4 mg/dL Final    Comment:    Performed at [facility], [address] [place], [place] [address]  03/09/2013 2.2 mg/dL Final    Comment:    8.1-7.5 THERAPEUTIC RANGE: 4-7 mg/dL TOXIC: > 10 mg/dL  -----------------------    Hgb A1c MFr Bld  Date Value Ref Range Status  05/04/2023 5.2 4.8 - 5.6 % Final    Comment:    (NOTE) Pre diabetes:          5.7%-6.4%  Diabetes:              >6.4%  Glycemic control for   <7.0% adults with diabetes    TSH  Date Value Ref Range Status  08/08/2023 0.516 0.350 - 4.500 uIU/mL Final    Comment:    Performed by a 3rd Generation assay with a functional sensitivity of <=0.01 uIU/mL. Performed at [facility], [address] [place], [place] [address]   11/17/2012 0.533 0.350 - 4.500 uIU/mL Final    Vital Signs: Admission weight: 193.3 lbs Temp:  [97.9 F (36.6 C)-98.7 F (37.1 C)] 98 F (36.7 C) (01/02 0733) Pulse Rate:  [68-78] 70 (01/02 0733) Resp:  [16-20] 16 (01/02 0733) BP: (88-109)/(42-54) 88/43 (01/02 0733) SpO2:  [98 %-100 %] 98 % (01/02 0733)  Intake/Output Summary (Last 24 hours) at 12/18/2023 1015 Last data filed at 12/18/2023 0407 Gross per 24 hour  Intake 300 ml  Output 1425 ml  Net -1125 ml   Current Heart Failure Medications:  Loop diuretic: none Beta-Blocker: none ACEI/ARB/ARNI: none MRA: none SGLT2i: none Other vasoactive medications include midodrine  2.5 mg TID  Prior to admission Heart Failure Medications:  Loop diuretic: furosemide  40 mg BID (off for 2 weeks prior to admit) Beta-Blocker: none ACEI/ARB/ARNI: none MRA: none SGLT2i: none Other: isosorbide  15 mg daily  Assessment: 1. Acute on chronic diastolic heart failure (LVEF 50-55%) with grade I diastolic dysfunction, due to mixed ICM and NICM. NYHA class II symptoms.   -Symptoms: Reports continued chest pain. Reports shortness of breath with exertion but not at rest. Reports appetite is fair. -Volume: Does not appear overtly volume overloaded. No LEE. Is ~7L net negative this admission if I/Os are accurate. Weight is down ~5 lbs. No diuretics ordered at this time.  -Hemodynamics: BP is low, requiring midodrine . HR is stable from 60-70s.  -SGLT2i: Can consider adding SGLT2i given minimal impact on BP.  -MRA: May require spironolactone  for HFpEF and cirrhosis. Can consider outpatient given discharge today.  -ARNI: Not a candidate due to low BP.  Plan: 1) Medication changes recommended at this time: -Can consider discharge with Farxiga 10 mg daily as first line therapy for HFpEF. -Consider resuming oral diuretics on discharge  2) Patient assistance: -None. Copays are $0.  3) Education: - Patient has been educated on current HF medications and potential additions to HF medication regimen - Patient verbalizes understanding that over the next few months, these medication doses may change and more medications may be added to optimize HF regimen - Patient has been educated on basic disease state pathophysiology and goals of therapy  Medication Assistance / Insurance Benefits Check: Does the patient have prescription insurance?    Type of insurance plan:  Does the patient qualify for medication assistance through manufacturers or grants? No   Outpatient Pharmacy: Prior to admission [facility] Is the patient agreeable to switch to [facility]?: Yes Is the patient willing to utilize a [facility] pharmacy at discharge?: Yes  Please do not hesitate to reach out with questions or concerns,  Jaun Bash, PharmD, CPP, BCPS Heart Failure Pharmacist  Phone - [phone] 12/18/2023 10:15 AM

## 2023-12-18 NOTE — TOC Transition Note (Signed)
 Transition of Care [facility]) - Discharge Note   Patient Details  Name: Ethan Wells MRN: [id number] Date of Birth: 10/03/1971  Transition of [facility] CM/SW Contact:  [name] [name], RN Phone Number: 12/18/2023, 10:05 AM   Clinical Narrative:    Spoke with patient regarding discharge plans. Patient does not have transportation home and is unable to pay for a taxi. RNCM arranged taxi via [employer]. Patient will discharge to [address] per his request.    TOC signing off       Barriers to Discharge: Continued Medical Work up   Patient Goals and CMS Choice            Discharge Placement                       Discharge Plan and Services Additional resources added to the After Visit Summary for       Post Acute Care Choice: NA                               Social Drivers of Health (SDOH) Interventions SDOH Screenings   Food Insecurity: No Food Insecurity (12/13/2023)  Housing: Low Risk  (12/15/2023)  Transportation Needs: No Transportation Needs (12/15/2023)  Recent Concern: Transportation Needs - Unmet Transportation Needs (12/13/2023)  Utilities: Not At Risk (12/15/2023)  Alcohol  Screen: Medium Risk (10/01/2022)  Financial Resource Strain: Low Risk  (12/15/2023)  Tobacco Use: High Risk (12/15/2023)     Readmission Risk Interventions    12/15/2023    2:55 PM 10/16/2023   12:56 PM 08/11/2023   10:35 AM  Readmission Risk Prevention Plan  Transportation Screening Complete Complete Complete  Medication Review Oceanographer) Complete Complete Complete  PCP or Specialist appointment within 3-5 days of discharge Complete Complete Complete  HRI or Home Care Consult  Patient refused   SW Recovery Care/Counseling Consult Complete Patient refused Complete  Palliative Care Screening Not Applicable Patient Refused Not Applicable  Skilled Nursing Facility Not Applicable Patient Refused Not Applicable

## 2023-12-18 NOTE — Plan of Care (Signed)
  Problem: Education: Goal: Knowledge of General Education information will improve Description: Including pain rating scale, medication(s)/side effects and non-pharmacologic comfort measures Outcome: Progressing   Problem: Health Behavior/Discharge Planning: Goal: Ability to manage health-related needs will improve Outcome: Progressing   Problem: Nutrition: Goal: Adequate nutrition will be maintained Outcome: Progressing   Problem: Pain Management: Goal: General experience of comfort will improve Outcome: Progressing   Problem: Activity: Goal: Capacity to carry out activities will improve Outcome: Progressing

## 2023-12-18 NOTE — Discharge Summary (Signed)
 Physician Discharge Summary   [name] MRN: [id number] DOB: 1971/07/26  Admit date:     12/12/2023  Discharge date: 12/18/2023  Discharge Physician: Concepcion Riser   PCP: Pcp, No   Recommendations at discharge:    PCP follow up in 1 week. Cardiology follow up as suggested.  Discharge Diagnoses: Principal Problem:   NSTEMI (non-ST elevated myocardial infarction) (HCC) Active Problems:   Adrenal insufficiency (Addison's disease) (HCC)   Alcoholic cirrhosis (HCC)   Esophageal reflux   Mesenteric artery stenosis (HCC)   Adrenal insufficiency (HCC)   HLD (hyperlipidemia)   Hypokalemia   Cocaine  dependence with cocaine -induced disorder (HCC)   Depression with anxiety   Alcohol  use disorder   Polysubstance abuse (HCC)   Tobacco abuse   Obesity   History of ST elevation myocardial infarction (STEMI)   Alcohol  dependence (HCC)   Hyperbilirubinemia   Secondary esophageal varices with bleeding (HCC)   Metabolic acidosis   Elevated liver function tests   COPD (chronic obstructive pulmonary disease) (HCC)   Essential hypertension  Resolved Problems:   * No resolved hospital problems. [facility] Course: Ethan Wells is a 53 y.o. male with a hx of coronary artery disease with prior STEMI and VF arrest in 01/2011 with LAD stenting, NSTEMI in 10/2022, ischemic cardiomyopathy, cirrhosis status post TIPS, polysubstance abuse, recurrent GI bleeding, hypertension, hyperlipidemia, mesenteric disease status post SMA stenting, presented for for the evaluation of chest pain.   Patient is started on heparin  drip per NSTEMI protocol.  Echocardiogram ordered.  His UDS is positive for cocaine .  Cardiology evaluated advised no beta-blocker or nitrates due to low blood pressures, not on aspirin  due to GI bleed. Echo showed preserved EF, Cardiologist did not recommend further cardiac work up. He complains of persistent chest pain, constipation. Advised maalox,  constipation regimen to be continued and follow cardiology, PCP upon discharge. He understands and agrees.  Assessment and Plan: * NSTEMI (non-ST elevated myocardial infarction) (HCC) Ischemic cardiomyopathy-EF recovered. H/o of CAD status post PCI to LAD, hyperlipidemia, hypertension Troponin trending down. Echo showed preserved EF, no wall motion abnormalities. Heparin  drip stopped due to drop in h/h and high risk for GI bleed. Patient is not a candidate for cardiac cath at this time per cardiology.  Continue medical management. Continue statin. UDS + for cocaine  12/26, no beta blocker. Can't give Nitrates due to low BP. Not on aspirin  due to GI bleed. Pain seems more epigastric, maalox, PPI, constipation regimen advised.   Polysubstance abuse (HCC) UDS positive for cocaine . Advised to quit illicit drug. Smoking cessation advised. Advised to strop drinking alcohol . Advised to limit opiates.   Tobacco abuse Continue nicotine  patch. Advised to quit smoking.   Chronic Hypotension BP lower side Caution with antihypertensives.advised to keep a log of BP at home. Continue low dose midodrine  therapy.   COPD (chronic obstructive pulmonary disease) (HCC) No acute exacerbation DuoNebs every 6 hours as needed for wheezing, shortness of breath.   Chronic diastolic CHF Stable. Last Echo 2/24 EF 50%. Continue Lasix . Monitor daily wrights, strict ins and output.   Hyperbilirubinemia Appears stable over the last 2 months   Hypokalemia- Improved.   Chronic Anemia- Stable. Baseline hemoglobin around 8. He had prior GI bleed. No anticoagulation per cardiology.   Obesity class 1 BMI 31.44 Diet, exercise and weight reduction advised.       Consultants: Cardiology Procedures performed: none  Disposition: Home Diet recommendation:  Discharge Diet Orders (From admission, onward)  Start     Ordered   12/18/23 0000  Diet - low sodium heart healthy        12/18/23 1326            Cardiac and Carb modified diet DISCHARGE MEDICATION: Allergies as of 12/18/2023       Reactions   Penicillins Hives, Rash   Tolerated Ceftriaxone  10/2022        Medication List     STOP taking these medications    predniSONE  10 MG (21) Tbpk tablet Commonly known as: STERAPRED UNI-PAK 21 TAB   QUEtiapine  50 MG tablet Commonly known as: SEROQUEL        TAKE these medications    albuterol  108 (90 Base) MCG/ACT inhaler Commonly known as: VENTOLIN  HFA Inhale 2 puffs into the lungs every 6 (six) hours as needed for wheezing or shortness of breath.   alum & mag hydroxide-simeth 200-200-20 MG/5ML suspension Commonly known as: MAALOX/MYLANTA Take 30 mLs by mouth every 6 (six) hours as needed for indigestion or heartburn.   Compressor/Nebulizer Misc Nebulizer + mask/hoses per patient preference and insurance coverage, use as directed   docusate sodium  100 MG capsule Commonly known as: COLACE Take 1 capsule (100 mg total) by mouth 2 (two) times daily as needed (if less than 3 BMs in past 24 hours).   feeding supplement Liqd Take 237 mLs by mouth 2 (two) times daily between meals.   fluticasone  furoate-vilanterol 100-25 MCG/ACT Aepb Commonly known as: BREO ELLIPTA  Inhale 1 puff into the lungs daily.   furosemide  40 MG tablet Commonly known as: Lasix  Take 1 tablet (40 mg total) by mouth daily. Increase to 1 tablet (40 mg total) by mouth TWICE daily (total daily dose 80 mg) as needed for up to 3 days for increased leg swelling, shortness of breath, weight gain 5+ lbs over 1-2 days. Seek medical care if these symptoms are not improving with increased dose.   gabapentin  100 MG capsule Commonly known as: NEURONTIN  Take 1 capsule (100 mg total) by mouth 2 (two) times daily. What changed: how much to take   ipratropium-albuterol  0.5-2.5 (3) MG/3ML Soln Commonly known as: DUONEB Take 3 mLs by nebulization every 2 (two) hours as needed (wheeze, SOB).    isosorbide  mononitrate 30 MG 24 hr tablet Commonly known as: IMDUR  Take 0.5 tablets (15 mg total) by mouth daily.   meloxicam  15 MG tablet Commonly known as: MOBIC  Take 1 tablet (15 mg total) by mouth daily for 14 days.   methocarbamol  500 MG tablet Commonly known as: ROBAXIN  Take 1 tablet (500 mg total) by mouth 4 (four) times daily for 10 days.   midodrine  5 MG tablet Commonly known as: PROAMATINE  Take 1 tablet (5 mg total) by mouth 3 (three) times daily with meals.   Nebulizer/Tubing/Mouthpiece Kit As directed   pantoprazole  40 MG tablet Commonly known as: PROTONIX  Take 1 tablet (40 mg total) by mouth 2 (two) times daily.   polyethylene glycol powder 17 GM/SCOOP powder Commonly known as: GLYCOLAX /MIRALAX  Take 17 g by mouth daily as needed (if less than 3 BMs in past 24 hours).   potassium chloride  SA 20 MEQ tablet Commonly known as: KLOR-CON  M Take 1 tablet (20 mEq total) by mouth daily.   rosuvastatin  40 MG tablet Commonly known as: CRESTOR  TAKE 1 TABLET BY MOUTH EVERY DAY        Follow-up Information     [facility]. Go in 9 day(s).   Specialty: Cardiology  Why: Hospital Follow-Up 12/24/23 @ 1:00 PM Please bring all medications with you to follow-up appointment [facility], [address], [address] [employer] Parking at the door Contact information: [address] [place]  [address] [phone]               Discharge Exam: Fredricka Weights   12/15/23 0433 12/16/23 0500 12/17/23 0500  Weight: 87.7 kg 85.7 kg 85.6 kg   General - Middle aged obese Caucasian male, no acute distress. HEENT - PERRLA, EOMI, atraumatic head, non tender sinuses. Lung - Clear, basal rales, rhonchi, no wheezes. Heart - S1, S2 heard, no murmurs, rubs, trace pedal edema. Abdomen - Soft, non tender, obese, bowel sounds good Neuro - Alert, awake and oriented x 3, non focal exam. Skin - Warm and  dry.  Condition at discharge: stable  The results of significant diagnostics from this hospitalization (including imaging, microbiology, ancillary and laboratory) are listed below for reference.   Imaging Studies: ECHOCARDIOGRAM COMPLETE Result Date: 12/13/2023    ECHOCARDIOGRAM REPORT   Patient Name:   ALEXEI DOSWELL Date of Exam: 12/13/2023 Medical Rec #:  [id number]           Height:       65.0 in Accession #:    [id number]          Weight:       191.0 lb Date of Birth:  Feb 22, 1971           BSA:          1.940 m Patient Age:    52 years            BP:           118/67 mmHg Patient Gender: M                   HR:           72 bpm. Exam Location:  [facility] Procedure: 2D Echo, 3D Echo, Cardiac Doppler and Color Doppler Indications:     Elevated Troponin                  Chest Pain R07.9  History:         Patient has prior history of Echocardiogram examinations, most                  recent 04/11/2023. Cardiomyopathy and CHF, CAD and Previous                  Myocardial Infarction, COPD and Migraines, Signs/Symptoms:Chest                  Pain; Risk Factors:Hypertension, Dyslipidemia and Current                  Smoker.  Sonographer:     Thea Norlander RCS Referring Phys:  [id number] AMY N COX Diagnosing Phys: Annabella Scarce MD IMPRESSIONS  1. Left ventricular ejection fraction, by estimation, is 50 to 55%. Left ventricular ejection fraction by 3D volume is 54 %. The left ventricle has low normal function. The left ventricle has no regional wall motion abnormalities. Left ventricular diastolic parameters are consistent with Grade I diastolic dysfunction (impaired relaxation).  2. Right ventricular systolic function is normal. The right ventricular size is normal. There is mildly elevated pulmonary artery systolic pressure.  3. The mitral valve is normal in structure. No evidence of mitral valve regurgitation. No evidence of mitral stenosis.  4. The aortic  valve is tricuspid. Aortic valve regurgitation is  not visualized. No aortic stenosis is present.  5. The inferior vena cava is normal in size with greater than 50% respiratory variability, suggesting right atrial pressure of 3 mmHg. FINDINGS  Left Ventricle: Left ventricular ejection fraction, by estimation, is 50 to 55%. Left ventricular ejection fraction by 3D volume is 54 %. The left ventricle has low normal function. The left ventricle has no regional wall motion abnormalities. The left ventricular internal cavity size was normal in size. There is no left ventricular hypertrophy. Left ventricular diastolic parameters are consistent with Grade I diastolic dysfunction (impaired relaxation). Right Ventricle: The right ventricular size is normal. No increase in right ventricular wall thickness. Right ventricular systolic function is normal. There is mildly elevated pulmonary artery systolic pressure. The tricuspid regurgitant velocity is 2.94  m/s, and with an assumed right atrial pressure of 3 mmHg, the estimated right ventricular systolic pressure is 37.6 mmHg. Left Atrium: Left atrial size was normal in size. Right Atrium: Right atrial size was normal in size. Pericardium: There is no evidence of pericardial effusion. Mitral Valve: The mitral valve is normal in structure. No evidence of mitral valve regurgitation. No evidence of mitral valve stenosis. Tricuspid Valve: The tricuspid valve is normal in structure. Tricuspid valve regurgitation is trivial. No evidence of tricuspid stenosis. Aortic Valve: The aortic valve is tricuspid. Aortic valve regurgitation is not visualized. No aortic stenosis is present. Aortic valve peak gradient measures 7.3 mmHg. Pulmonic Valve: The pulmonic valve was normal in structure. Pulmonic valve regurgitation is not visualized. No evidence of pulmonic stenosis. Aorta: The aortic root is normal in size and structure. Venous: The inferior vena cava is normal in size with greater than 50% respiratory variability, suggesting right  atrial pressure of 3 mmHg. IAS/Shunts: No atrial level shunt detected by color flow Doppler.  LEFT VENTRICLE PLAX 2D LVIDd:         6.00 cm         Diastology LVIDs:         4.30 cm         LV e' medial:    5.98 cm/s LV PW:         0.90 cm         LV E/e' medial:  11.3 LV IVS:        0.80 cm         LV e' lateral:   9.03 cm/s LVOT diam:     2.20 cm         LV E/e' lateral: 7.5 LV SV:         75 LV SV Index:   39 LVOT Area:     3.80 cm        3D Volume EF                                LV 3D EF:    Left                                             ventricul  ar                                             ejection                                             fraction                                             by 3D                                             volume is                                             54 %.                                 3D Volume EF:                                3D EF:        54 %                                LV EDV:       225 ml                                LV ESV:       103 ml                                LV SV:        122 ml RIGHT VENTRICLE             IVC RV S prime:     14.60 cm/s  IVC diam: 1.70 cm TAPSE (M-mode): 2.9 cm LEFT ATRIUM             Index        RIGHT ATRIUM           Index LA diam:        4.30 cm 2.22 cm/m   RA Area:     14.70 cm LA Vol (A2C):   47.7 ml 24.59 ml/m  RA Volume:   32.80 ml  16.91 ml/m LA Vol (A4C):   55.1 ml 28.40 ml/m LA Biplane Vol: 54.0 ml 27.84 ml/m  AORTIC VALVE AV Area (Vmax): 2.84 cm AV Vmax:        135.00 cm/s AV Peak Grad:   7.3 mmHg LVOT Vmax:      101.00 cm/s LVOT Vmean:     65.700 cm/s LVOT  VTI:       0.198 m  AORTA Ao Root diam: 3.40 cm Ao Asc diam:  3.00 cm MITRAL VALVE               TRICUSPID VALVE MV Area (PHT): 3.91 cm    TR Peak grad:   34.6 mmHg MV Decel Time: 194 msec    TR Vmax:        294.00 cm/s MV E velocity: 67.60 cm/s MV A velocity: 80.70 cm/s  SHUNTS MV E/A ratio:  0.84         Systemic VTI:  0.20 m                            Systemic Diam: 2.20 cm Annabella Scarce MD Electronically signed by Annabella Scarce MD Signature Date/Time: 12/13/2023/5:44:27 PM    Final    CT Angio Chest PE W and/or Wo Contrast Result Date: 12/12/2023 CLINICAL DATA:  Pulmonary embolism (PE) suspected, high prob. Chest pain. EXAM: CT ANGIOGRAPHY CHEST WITH CONTRAST TECHNIQUE: Multidetector CT imaging of the chest was performed using the standard protocol during bolus administration of intravenous contrast. Multiplanar CT image reconstructions and MIPs were obtained to evaluate the vascular anatomy. RADIATION DOSE REDUCTION: This exam was performed according to the departmental dose-optimization program which includes automated exposure control, adjustment of the mA and/or kV according to patient size and/or use of iterative reconstruction technique. CONTRAST:  75mL OMNIPAQUE  IOHEXOL  350 MG/ML SOLN COMPARISON:  CTA chest, abdomen, pelvis dated November 14, 2023. FINDINGS: Cardiovascular: Satisfactory opacification of the pulmonary arteries to the segmental level. No evidence of pulmonary embolism. There is dilation of the main pulmonary artery measuring up to 3.7 cm in diameter, which can be seen in the setting of pulmonary arterial hypertension. The heart is enlarged, unchanged. No pericardial effusion. The thoracic aorta is normal in caliber. Atherosclerotic calcification of the thoracic aorta and coronary arteries. Mediastinum/Nodes: No enlarged mediastinal, hilar, or axillary lymph nodes. Adherent secretions within the trachea. Thyroid  gland and esophagus demonstrate no significant findings. Lungs/Pleura: No focal consolidation, pleural effusion, or pneumothorax. Unchanged 5 mm right middle lobe nodule (series 5, image 84). No specific follow-up imaging recommended. Upper Abdomen: Partially imaged tips and celiac artery and SMA stent. Similar cirrhotic morphology of the liver. Musculoskeletal: No acute  or significant osseous findings. Similar defect at the superolateral left humeral head, may reflect sequela of prior dislocation. Review of the MIP images confirms the above findings. IMPRESSION: 1. No evidence of pulmonary embolism. 2. No acute intrathoracic findings. 3. Dilation of the main pulmonary artery, which can be seen in the setting of pulmonary arterial hypertension. 4. Cardiomegaly. 5.  Aortic Atherosclerosis (ICD10-I70.0). Electronically Signed   By: Harrietta Sherry M.D.   On: 12/12/2023 11:40   DG Chest 2 View Result Date: 12/12/2023 CLINICAL DATA:  Chest pain. EXAM: CHEST - 2 VIEW COMPARISON:  Chest radiograph dated November 24, 2023. FINDINGS: The heart size and mediastinal contours are unchanged. No focal consolidation, pleural effusion, or pneumothorax. No acute osseous abnormality. IMPRESSION: No acute cardiopulmonary findings. Electronically Signed   By: Harrietta Sherry M.D.   On: 12/12/2023 09:44   MR LUMBAR SPINE WO CONTRAST Result Date: 12/11/2023 CLINICAL DATA:  53 year old male with acute on chronic low back pain, difficulty walking. Bilateral leg weakness. Legs do not work. EXAM: MRI LUMBAR SPINE WITHOUT CONTRAST TECHNIQUE: Multiplanar, multisequence MR imaging of the lumbar spine was performed. No intravenous contrast was administered. COMPARISON:  CT Abdomen and Pelvis 11/14/2023. Lumbar MRI 05/07/2023. FINDINGS: Segmentation: Normal on the comparison CT, the same numbering system used on the May MRI. Alignment: Stable lumbar lordosis since May. No significant scoliosis. Mild degenerative appearing retrolisthesis of L5 on S1. Vertebrae: Patchy endplate marrow edema at L5-S1 appears likely to be degenerative in nature, with vacuum disc there last month, still visible by MRI today, new since May. Surrounding disc desiccation. Mild disc space loss since May. Maintained vertebral body height. Background bone marrow signal is stable, with borderline to mild generalized nonspecific  decreased T1 signal. But no other areas of marrow edema. Visible sacrum and SI joints appear intact. Conus medullaris and cauda equina: Conus extends to the T12-L1 level. No lower spinal cord or conus signal abnormality. Normal cauda equina nerve roots. Paraspinal and other soft tissues: Negative visible abdominal viscera. No convincing paraspinal soft tissue inflammation. Disc levels: T11-T12: Some disc desiccation but no disc bulge or protrusion. Mild posterior element hypertrophy. No spinal stenosis. T12-L1:  Negative. L1-L2:  Negative. L2-L3: Minimal, far lateral disc bulging. Mild facet and ligament flavum hypertrophy. No stenosis. L3-L4: Mild disc desiccation and circumferential disc bulge. Mild posterior element hypertrophy. No significant stenosis. L4-L5: Mild disc space loss and circumferential disc bulge asymmetric to the right. Broad-based right foraminal disc does appear mildly increased since May (series 8, image 23). Only mild facet hypertrophy. No spinal or lateral recess stenosis. But mild to moderate right L4 neural foraminal stenosis which is stable versus mildly increased. L5-S1: Increased disc desiccation, new vacuum disc since May. Circumferential disc bulge with indistinct and bulky left far lateral disc extrusion now (series 8, image 30). Superimposed mild facet and ligament flavum hypertrophy. Trace facet joint fluid on the left is chronic. There is no spinal or lateral recess stenosis. However, moderate to severe left L5 neural foraminal stenosis has increased. And contralateral mild to moderate right foraminal stenosis is new. IMPRESSION: 1. Compared to May MRI the dominant finding is progressed and far lateral L5-S1 disc extrusion, with new vacuum disc there which was present on CTA last month. Mild endplate marrow edema at that level appears to be degenerative in nature. Subsequent increased and moderate to severe left and up to moderate right L5 neural foraminal stenosis. Query L5  radiculitis. 2. L4-L5 right foraminal disc protrusion also appears mildly increased. Query right L4 radiculitis. 3. No significant lumbar spinal stenosis. No visible lower thoracic spinal stenosis. Electronically Signed   By: VEAR Hurst M.D.   On: 12/11/2023 07:48   DG Chest 2 View Result Date: 11/24/2023 CLINICAL DATA:  Chest pain EXAM: CHEST - 2 VIEW COMPARISON:  11/22/2023 FINDINGS: The heart size and mediastinal contours are within normal limits. Both lungs are clear. The visualized skeletal structures are unremarkable. IMPRESSION: No active cardiopulmonary disease. Electronically Signed   By: Oneil Devonshire M.D.   On: 11/24/2023 23:34   DG Chest 2 View Result Date: 11/22/2023 CLINICAL DATA:  Chest pain EXAM: CHEST - 2 VIEW COMPARISON:  11/14/2023 FINDINGS: Cardiomegaly. No confluent airspace opacity or effusion. No overt edema. No acute bony abnormality. IMPRESSION: Cardiomegaly.  No active disease. Electronically Signed   By: Franky Crease M.D.   On: 11/22/2023 23:05    Microbiology: Results for orders placed or performed during the hospital encounter of 12/12/23  Resp panel by RT-PCR (RSV, Flu A&B, Covid) Anterior Nasal Swab     Status: None   Collection Time: 12/12/23 10:01 AM   Specimen: Anterior Nasal Swab  Result Value Ref  Range Status   SARS Coronavirus 2 by RT PCR NEGATIVE NEGATIVE Final    Comment: (NOTE) SARS-CoV-2 target nucleic acids are NOT DETECTED.  The SARS-CoV-2 RNA is generally detectable in upper respiratory specimens during the acute phase of infection. The lowest concentration of SARS-CoV-2 viral copies this assay can detect is 138 copies/mL. A negative result does not preclude SARS-Cov-2 infection and should not be used as the sole basis for treatment or other patient management decisions. A negative result may occur with  improper specimen collection/handling, submission of specimen other than nasopharyngeal swab, presence of viral mutation(s) within the areas  targeted by this assay, and inadequate number of viral copies(<138 copies/mL). A negative result must be combined with clinical observations, patient history, and epidemiological information. The expected result is Negative.  Fact Sheet for Patients:  bloggercourse.com  Fact Sheet for Healthcare Providers:  seriousbroker.it  This test is no t yet approved or cleared by the [place]  FDA and  has been authorized for detection and/or diagnosis of SARS-CoV-2 by FDA under an Emergency Use Authorization (EUA). This EUA will remain  in effect (meaning this test can be used) for the duration of the COVID-19 declaration under Section 564(b)(1) of the Act, 21 U.S.C.section 360bbb-3(b)(1), unless the authorization is terminated  or revoked sooner.       Influenza A by PCR NEGATIVE NEGATIVE Final   Influenza B by PCR NEGATIVE NEGATIVE Final    Comment: (NOTE) The Xpert Xpress SARS-CoV-2/FLU/RSV plus assay is intended as an aid in the diagnosis of influenza from Nasopharyngeal swab specimens and should not be used as a sole basis for treatment. Nasal washings and aspirates are unacceptable for Xpert Xpress SARS-CoV-2/FLU/RSV testing.  Fact Sheet for Patients: bloggercourse.com  Fact Sheet for Healthcare Providers: seriousbroker.it  This test is not yet approved or cleared by the [place]  FDA and has been authorized for detection and/or diagnosis of SARS-CoV-2 by FDA under an Emergency Use Authorization (EUA). This EUA will remain in effect (meaning this test can be used) for the duration of the COVID-19 declaration under Section 564(b)(1) of the Act, 21 U.S.C. section 360bbb-3(b)(1), unless the authorization is terminated or revoked.     Resp Syncytial Virus by PCR NEGATIVE NEGATIVE Final    Comment: (NOTE) Fact Sheet for  Patients: bloggercourse.com  Fact Sheet for Healthcare Providers: seriousbroker.it  This test is not yet approved or cleared by the [place]  FDA and has been authorized for detection and/or diagnosis of SARS-CoV-2 by FDA under an Emergency Use Authorization (EUA). This EUA will remain in effect (meaning this test can be used) for the duration of the COVID-19 declaration under Section 564(b)(1) of the Act, 21 U.S.C. section 360bbb-3(b)(1), unless the authorization is terminated or revoked.  Performed at [facility], [address] [place], [place] [address]     Labs: CBC: Recent Labs  Lab 12/12/23 0900 12/13/23 0403 12/13/23 1213 12/14/23 0359 12/15/23 0247 12/17/23 1426  WBC 4.5 4.8 5.6 4.4 4.3  --   HGB 9.7* 8.9* 8.7* 8.5* 9.2* 9.9*  HCT 29.2* 27.4* 26.5* 26.2* 27.3* 30.7*  MCV 94.8 95.1 96.7 96.3 95.1  --   PLT 97* 101* 111* 102* 108*  --    Basic Metabolic Panel: Recent Labs  Lab 12/11/23 2228 12/12/23 0900 12/13/23 0403  NA 137 137 136  K 3.4* 3.4* 3.8  CL 106 105 107  CO2 16* 20* 22  GLUCOSE 146* 131* 103*  BUN 6 8 10   CREATININE 0.59* 0.62  0.64  CALCIUM  8.8* 8.7* 8.1*  MG  --  1.5*  --    Liver Function Tests: Recent Labs  Lab 12/12/23 0900  AST 56*  ALT 16  ALKPHOS 165*  BILITOT 3.0*  PROT 7.0  ALBUMIN  3.0*   CBG: No results for input(s): GLUCAP in the last 168 hours.  Discharge time spent: 37 minutes.  Signed: Concepcion Riser, MD [facility] 12/18/2023

## 2023-12-18 NOTE — Telephone Encounter (Addendum)
 ed requesting clinic f/u for patient. okay for me or app clinic. Sent: 12/11/2023   2:52 PM EST Lovenia Kim, MD  [facility]  Patient is still currently admitted in the hospital. We will contact patient once he is discharged.

## 2023-12-18 NOTE — Consult Note (Signed)
 [facility] Liaison Note  12/18/2023  Ethan Wells 1971-06-14 [id number]  Location: RN Hospital Liaison screened the patient remotely at [facility].  Insurance: [employer] HMO   Ethan Wells is a 53 y.o. male who is a Primary Care Patient of Pcp, No The patient was screened for 30 day readmission hospitalization with noted extreme risk score for unplanned readmission risk with 6 IP/7 ED in 6 months.  The patient was assessed for potential Care Management service needs for post hospital transition for care coordination. Review of patient's electronic medical record reveals patient was NSTEMI.  Liaison spoke with pt concerning PCP pt verified he does not have a PCP but has paperwork to establish a PCP. Based upon this information pt is not eligible for VBCI care management services based upon no PCP involvement.  Strongly encouraged pt to establish a PCP for future follow up care. No request or further inquires at this time.   VBCI Care Management/Population Health does not replace or interfere with any arrangements made by the Inpatient Transition of Care team.   For questions contact:   Olam Ku, RN, [facility] Liaison [place]   [facility], Population Health Office Hours MTWF  8:00 am-6:00 pm Direct Dial: [phone] mobile [phone] [Office toll free line] Office Hours are M-F 8:30 - 5 pm [email or web address]

## 2023-12-19 NOTE — Telephone Encounter (Signed)
 Left message to call back

## 2023-12-22 NOTE — Telephone Encounter (Signed)
 Left message to call back

## 2023-12-23 NOTE — Progress Notes (Deleted)
 Advanced Heart Failure Clinic Note   Referring Physician: recent admission PCP: Pcp, No Cardiologist: Ethan Cash, MD   Chief Complaint:   HPI:  Ethan Wells is a 52 y/o male with a history of NSTEMI (11/23), VF 02/ 2012 with LAD stenting, Addison's disease, alcoholic cirrhosis s/p TIPS, hyperbilirubinemia, anemia, GERD, mesenteric artery stenosis with SMA stenting, HLD, depression, anxiety, cocaine / etoh use, COPD, HTN, tobacco use, esophageal varices and chronic heart failure.   Admitted 12/12/23 due to chest pain. Heparin  started. UDS + cocaine . Cardiology consulted. No ASA due to GIB. Echo showed preserved EF. Not a cath candidate due to previous results. No long term anticoag due to GIB. Chest pain felt to be more epigastric. Medications limited by hypotension.   Echo 12/13/23: EF 50-55% with Grade I DD, mildly elevated PA Pressure  LHC 11/04/22:      Mid Cx lesion is 100% stenosed.   Ramus lesion is 85% stenosed.   Prox LAD to Mid LAD lesion is 5% stenosed.   Prox RCA to Mid RCA lesion is 100% stenosed.   Prox RCA lesion is 80% stenosed.   There is mild left ventricular systolic dysfunction.   LV end diastolic pressure is moderately elevated.   The left ventricular ejection fraction is 45-50% by visual estimate.  1.  Severe two-vessel coronary artery disease with chronically occluded mid left circumflex and mid right coronary artery with collaterals.  These vessels were patent with mild disease on previous cardiac catheterization in 2012.  LAD stent is patent with minimal restenosis.  In addition, there is progression of proximal to mid ramus stenosis. 2.  Mildly reduced LV systolic function with an EF of 45%. 3.  Moderately elevated left ventricular end-diastolic pressure at 26 mmHg.  He presents today for his initial HF visit with a chief complaint of   Review of Systems: [y] = yes, [ ]  = no   General: Weight gain [ ] ; Weight loss [ ] ; Anorexia [ ] ; Fatigue [ ] ;  Fever [ ] ; Chills [ ] ; Weakness [ ]   Cardiac: Chest pain/pressure [ ] ; Resting SOB [ ] ; Exertional SOB [ ] ; Orthopnea [ ] ; Pedal Edema [ ] ; Palpitations [ ] ; Syncope [ ] ; Presyncope [ ] ; Paroxysmal nocturnal dyspnea[ ]   Pulmonary: Cough [ ] ; Wheezing[ ] ; Hemoptysis[ ] ; Sputum [ ] ; Snoring [ ]   GI: Vomiting[ ] ; Dysphagia[ ] ; Melena[ ] ; Hematochezia [ ] ; Heartburn[ ] ; Abdominal pain [ ] ; Constipation [ ] ; Diarrhea [ ] ; BRBPR [ ]   GU: Hematuria[ ] ; Dysuria [ ] ; Nocturia[ ]   Vascular: Pain in legs with walking [ ] ; Pain in feet with lying flat [ ] ; Non-healing sores [ ] ; Stroke [ ] ; TIA [ ] ; Slurred speech [ ] ;  Neuro: Headaches[ ] ; Vertigo[ ] ; Seizures[ ] ; Paresthesias[ ] ;Blurred vision [ ] ; Diplopia [ ] ; Vision changes [ ]   Ortho/Skin: Arthritis [ ] ; Joint pain [ ] ; Muscle pain [ ] ; Joint swelling [ ] ; Back Pain [ ] ; Rash [ ]   Psych: Depression[ ] ; Anxiety[ ]   Heme: Bleeding problems [ ] ; Clotting disorders [ ] ; Anemia [ ]   Endocrine: Diabetes [ ] ; Thyroid  dysfunction[ ]    Past Medical History:  Diagnosis Date   (HFpEF) heart failure with preserved ejection fraction (HCC)    Addison disease (HCC)    CAD (coronary artery disease)    a. 01/2011 Anterior STEMI/Cath/PCI: LM nl, LAD 100d (3.5x53mm Vision BMS placed), LCX 33m, RI 50, RCA min irregs, EF 40% w/ apical, inferoapical HK; b. 10/2022 NSTSEMI/Cath:  LM nl, LAD 5p/m, RI 85, LCX 166m, OM1 mild dzs, OM3 100 fills via L->L collats from dLAD, RCA 80p, 141m, RPDA fills via collats from LAD. EF 45-50%-->Med Rx.   CAD, multiple vessel    Cardiac arrest - ventricular fibrillation    a. In setting of STEMI 01/2011   Chronic, continuous use of opioids    Cocaine  abuse (HCC)    COPD (chronic obstructive pulmonary disease) (HCC)    Depression with anxiety    ETOH abuse    a. 6-12 beers/day   GERD (gastroesophageal reflux disease)    GI bleed    Hemorrhoids    Hyperlipidemia    Hypertension    Ischemic cardiomyopathy    a. 06/2011 Echo: EF 45-50%,  No rwma; b. 10/2022 Echo: EF 55-60%, no rwma, nl RV fxn.   Liver cirrhosis, alcoholic (HCC)    Marijuana abuse    Migraines    Noncompliance with immunization regimen    Obesity (BMI 30.0-34.9)    Orthostatic hypertension    midodrine    Portal hypertension (HCC)    s/p TIPS   STEMI (ST elevation myocardial infarction) (HCC) 2012   Tobacco abuse    a. 1/2 ppd x 26 yrs    Current Outpatient Medications  Medication Sig Dispense Refill   albuterol  (VENTOLIN  HFA) 108 (90 Base) MCG/ACT inhaler Inhale 2 puffs into the lungs every 6 (six) hours as needed for wheezing or shortness of breath. 8 g 0   alum & mag hydroxide-simeth (MAALOX/MYLANTA) 200-200-20 MG/5ML suspension Take 30 mLs by mouth every 6 (six) hours as needed for indigestion or heartburn. 355 mL 0   docusate sodium  (COLACE) 100 MG capsule Take 1 capsule (100 mg total) by mouth 2 (two) times daily as needed (if less than 3 BMs in past 24 hours). 60 capsule    feeding supplement (ENSURE ENLIVE / ENSURE PLUS) LIQD Take 237 mLs by mouth 2 (two) times daily between meals.     fluticasone  furoate-vilanterol (BREO ELLIPTA ) 100-25 MCG/ACT AEPB Inhale 1 puff into the lungs daily. (Patient not taking: Reported on 11/23/2023) 1 each 0   furosemide  (LASIX ) 40 MG tablet Take 1 tablet (40 mg total) by mouth daily. Increase to 1 tablet (40 mg total) by mouth TWICE daily (total daily dose 80 mg) as needed for up to 3 days for increased leg swelling, shortness of breath, weight gain 5+ lbs over 1-2 days. Seek medical care if these symptoms are not improving with increased dose. 45 tablet 0   gabapentin  (NEURONTIN ) 100 MG capsule Take 1 capsule (100 mg total) by mouth 2 (two) times daily. (Patient taking differently: Take 300 mg by mouth 2 (two) times daily.) 60 capsule 0   ipratropium-albuterol  (DUONEB) 0.5-2.5 (3) MG/3ML SOLN Take 3 mLs by nebulization every 2 (two) hours as needed (wheeze, SOB). 60 mL 3   isosorbide  mononitrate (IMDUR ) 30 MG 24 hr  tablet Take 0.5 tablets (15 mg total) by mouth daily. 30 tablet 1   meloxicam  (MOBIC ) 15 MG tablet Take 1 tablet (15 mg total) by mouth daily for 14 days. 14 tablet 0   midodrine  (PROAMATINE ) 5 MG tablet Take 1 tablet (5 mg total) by mouth 3 (three) times daily with meals. (Patient not taking: Reported on 11/23/2023) 90 tablet 0   Nebulizers (COMPRESSOR/NEBULIZER) MISC Nebulizer + mask/hoses per patient preference and insurance coverage, use as directed 1 each 0   pantoprazole  (PROTONIX ) 40 MG tablet Take 1 tablet (40 mg total) by mouth 2 (two)  times daily. 60 tablet 0   polyethylene glycol powder (GLYCOLAX /MIRALAX ) 17 GM/SCOOP powder Take 17 g by mouth daily as needed (if less than 3 BMs in past 24 hours). 238 g 0   potassium chloride  SA (KLOR-CON  M) 20 MEQ tablet Take 1 tablet (20 mEq total) by mouth daily. 30 tablet 0   Respiratory Therapy Supplies (NEBULIZER/TUBING/MOUTHPIECE) KIT As directed 1 kit PRN   rosuvastatin  (CRESTOR ) 40 MG tablet TAKE 1 TABLET BY MOUTH EVERY DAY 30 tablet 0   No current facility-administered medications for this visit.    Allergies  Allergen Reactions   Penicillins Hives and Rash    Tolerated Ceftriaxone  10/2022      Social History   Socioeconomic History   Marital status: Single    Spouse name: Not on file   Number of children: 2   Years of education: Not on file   Highest education level: 7th grade  Occupational History   Not on file  Tobacco Use   Smoking status: Every Day    Current packs/day: 0.50    Types: Cigarettes   Smokeless tobacco: Never   Tobacco comments:    started age 39  Vaping Use   Vaping status: Never Used  Substance and Sexual Activity   Alcohol  use: Yes    Alcohol /week: 2.0 standard drinks of alcohol     Types: 2 Cans of beer per week    Comment: 2 wine coolers per day   Drug use: Yes    Types: Cocaine    Sexual activity: Not Currently  Other Topics Concern   Not on file  Social History Narrative   Single, lives in  [place] with his mother.  He does not routinely exercise.  He sometimes works, helping to deliver appliances to homes.   Social Drivers of Corporate Investment Banker Strain: Low Risk  (12/15/2023)   Overall Financial Resource Strain (CARDIA)    Difficulty of Paying Living Expenses: Not hard at all  Food Insecurity: No Food Insecurity (12/13/2023)   Hunger Vital Sign    Worried About Running Out of Food in the Last Year: Never true    Ran Out of Food in the Last Year: Never true  Transportation Needs: No Transportation Needs (12/15/2023)   PRAPARE - Administrator, Civil Service (Medical): No    Lack of Transportation (Non-Medical): No  Recent Concern: Transportation Needs - Unmet Transportation Needs (12/13/2023)   PRAPARE - Administrator, Civil Service (Medical): Yes    Lack of Transportation (Non-Medical): Yes  Physical Activity: Not on file  Stress: Not on file  Social Connections: Not on file  Intimate Partner Violence: Not At Risk (12/13/2023)   Humiliation, Afraid, Rape, and Kick questionnaire    Fear of Current or Ex-Partner: No    Emotionally Abused: No    Physically Abused: No    Sexually Abused: No      Family History  Problem Relation Age of Onset   Coronary artery disease Mother        alive   Other Other        2 sisters alive and well   Alcohol  abuse Father        died @ 35   Other Paternal Grandmother        PACER       PHYSICAL EXAM: General:  Well appearing. No respiratory difficulty HEENT: normal Neck: supple. no JVD. Carotids 2+ bilat; no bruits. No lymphadenopathy or thyromegaly appreciated. Cor: PMI  nondisplaced. Regular rate & rhythm. No rubs, gallops or murmurs. Lungs: clear Abdomen: soft, nontender, nondistended. No hepatosplenomegaly. No bruits or masses. Good bowel sounds. Extremities: no cyanosis, clubbing, rash, edema Neuro: alert & oriented x 3, cranial nerves grossly intact. moves all 4 extremities w/o  difficulty. Affect pleasant.  ECG:   ASSESSMENT & PLAN:  1: Chronic heart failure with preserved ejection fraction- - suspect due to - NYHA class - euvolemic - weighing daily - Echo 12/13/23: EF 50-55% with Grade I DD, mildly elevated PA Pressure - continue  - BNP 09/03/23 was 466.7  2: HTN- - BP - no PCP - BMET 12/13/23 showed sodium 136, potassium 3.8, creatinine 0.64 and GFR >60  3: CAD- - NSTEMI - LHC 11/23 showed 2 vessel CAD - continue  4: Substance use- - UDS 12/24 was + cocaine    Ellouise DELENA Class, FNP 12/23/23

## 2023-12-24 ENCOUNTER — Telehealth: Payer: Self-pay | Admitting: Family

## 2023-12-24 ENCOUNTER — Encounter: Payer: Medicare HMO | Admitting: Family

## 2023-12-24 NOTE — Telephone Encounter (Signed)
 Patient did not show for his initial Heart Failure Clinic appointment on 12/24/23.

## 2023-12-26 NOTE — Telephone Encounter (Signed)
 Patient is declining appt at this moment. He has a lot going on with himself and his mom. He will call back when he is ready and has time to keep an appt.

## 2023-12-31 ENCOUNTER — Other Ambulatory Visit: Payer: Self-pay

## 2023-12-31 ENCOUNTER — Emergency Department: Payer: Medicaid Other

## 2023-12-31 ENCOUNTER — Emergency Department
Admission: EM | Admit: 2023-12-31 | Discharge: 2023-12-31 | Discharge status: Against Medical Advice | Payer: Medicare HMO | Attending: Emergency Medicine | Admitting: Emergency Medicine

## 2023-12-31 DIAGNOSIS — R079 Chest pain, unspecified: Secondary | ICD-10-CM | POA: Insufficient documentation

## 2023-12-31 DIAGNOSIS — Y908 Blood alcohol level of 240 mg/100 ml or more: Secondary | ICD-10-CM | POA: Diagnosis not present

## 2023-12-31 DIAGNOSIS — Z5321 Procedure and treatment not carried out due to patient leaving prior to being seen by health care provider: Secondary | ICD-10-CM | POA: Diagnosis not present

## 2023-12-31 DIAGNOSIS — M791 Myalgia, unspecified site: Secondary | ICD-10-CM | POA: Diagnosis not present

## 2023-12-31 DIAGNOSIS — I10 Essential (primary) hypertension: Secondary | ICD-10-CM | POA: Diagnosis not present

## 2023-12-31 DIAGNOSIS — F1090 Alcohol use, unspecified, uncomplicated: Secondary | ICD-10-CM | POA: Insufficient documentation

## 2023-12-31 DIAGNOSIS — R4781 Slurred speech: Secondary | ICD-10-CM | POA: Diagnosis not present

## 2023-12-31 DIAGNOSIS — E860 Dehydration: Secondary | ICD-10-CM | POA: Diagnosis not present

## 2023-12-31 LAB — CBC
HCT: 34.2 % — ABNORMAL LOW (ref 39.0–52.0)
Hemoglobin: 11 g/dL — ABNORMAL LOW (ref 13.0–17.0)
MCH: 29.9 pg (ref 26.0–34.0)
MCHC: 32.2 g/dL (ref 30.0–36.0)
MCV: 92.9 fL (ref 80.0–100.0)
Platelets: 190 10*3/uL (ref 150–400)
RBC: 3.68 MIL/uL — ABNORMAL LOW (ref 4.22–5.81)
RDW: 17.2 % — ABNORMAL HIGH (ref 11.5–15.5)
WBC: 7.7 10*3/uL (ref 4.0–10.5)
nRBC: 0 % (ref 0.0–0.2)

## 2023-12-31 LAB — ETHANOL: Alcohol, Ethyl (B): 341 mg/dL (ref <10)

## 2023-12-31 NOTE — ED Triage Notes (Signed)
 EMS brings pt in from home for "pain everywhere"; +ETOH

## 2023-12-31 NOTE — ED Triage Notes (Addendum)
 Pt arrives via [facility] with CC of CP "that radiates to my back" and generalized "pain all over" - no objective indicators of pain present at this time. Pt intoxicated and in good spirits - making jokes with staff. Fall risk bracelet and shoes on pt.

## 2024-01-02 ENCOUNTER — Other Ambulatory Visit: Payer: Self-pay

## 2024-01-02 ENCOUNTER — Emergency Department
Admission: EM | Admit: 2024-01-02 | Discharge: 2024-01-02 | Disposition: A | Discharge status: Home / Self Care | Payer: Medicare HMO | Attending: Emergency Medicine | Admitting: Emergency Medicine

## 2024-01-02 ENCOUNTER — Emergency Department: Payer: Medicare HMO

## 2024-01-02 DIAGNOSIS — R0789 Other chest pain: Secondary | ICD-10-CM | POA: Diagnosis not present

## 2024-01-02 DIAGNOSIS — R1084 Generalized abdominal pain: Secondary | ICD-10-CM | POA: Diagnosis not present

## 2024-01-02 DIAGNOSIS — F1020 Alcohol dependence, uncomplicated: Secondary | ICD-10-CM | POA: Insufficient documentation

## 2024-01-02 DIAGNOSIS — I2510 Atherosclerotic heart disease of native coronary artery without angina pectoris: Secondary | ICD-10-CM | POA: Diagnosis not present

## 2024-01-02 DIAGNOSIS — R000 Tachycardia, unspecified: Secondary | ICD-10-CM | POA: Diagnosis not present

## 2024-01-02 DIAGNOSIS — I10 Essential (primary) hypertension: Secondary | ICD-10-CM | POA: Diagnosis not present

## 2024-01-02 DIAGNOSIS — J449 Chronic obstructive pulmonary disease, unspecified: Secondary | ICD-10-CM | POA: Insufficient documentation

## 2024-01-02 DIAGNOSIS — R1110 Vomiting, unspecified: Secondary | ICD-10-CM | POA: Diagnosis present

## 2024-01-02 DIAGNOSIS — R079 Chest pain, unspecified: Secondary | ICD-10-CM | POA: Diagnosis not present

## 2024-01-02 LAB — CBC
HCT: 32.5 % — ABNORMAL LOW (ref 39.0–52.0)
Hemoglobin: 10.8 g/dL — ABNORMAL LOW (ref 13.0–17.0)
MCH: 30.3 pg (ref 26.0–34.0)
MCHC: 33.2 g/dL (ref 30.0–36.0)
MCV: 91 fL (ref 80.0–100.0)
Platelets: 130 10*3/uL — ABNORMAL LOW (ref 150–400)
RBC: 3.57 MIL/uL — ABNORMAL LOW (ref 4.22–5.81)
RDW: 16.9 % — ABNORMAL HIGH (ref 11.5–15.5)
WBC: 8.4 10*3/uL (ref 4.0–10.5)
nRBC: 0 % (ref 0.0–0.2)

## 2024-01-02 LAB — BASIC METABOLIC PANEL
Anion gap: 12 (ref 5–15)
BUN: <5 mg/dL — ABNORMAL LOW (ref 6–20)
CO2: 21 mmol/L — ABNORMAL LOW (ref 22–32)
Calcium: 8.6 mg/dL — ABNORMAL LOW (ref 8.9–10.3)
Chloride: 108 mmol/L (ref 98–111)
Creatinine, Ser: 0.55 mg/dL — ABNORMAL LOW (ref 0.61–1.24)
GFR, Estimated: >60 mL/min (ref >60)
Glucose, Bld: 110 mg/dL — ABNORMAL HIGH (ref 70–99)
Potassium: 3.4 mmol/L — ABNORMAL LOW (ref 3.5–5.1)
Sodium: 141 mmol/L (ref 135–145)

## 2024-01-02 LAB — TROPONIN I (HIGH SENSITIVITY)
Troponin I (High Sensitivity): 20 ng/L — ABNORMAL HIGH (ref <18)
Troponin I (High Sensitivity): 21 ng/L — ABNORMAL HIGH (ref <18)

## 2024-01-02 MED ORDER — CHLORDIAZEPOXIDE HCL 25 MG PO CAPS: ORAL_CAPSULE | ORAL | 0 refills | Status: AC

## 2024-01-02 MED ORDER — ONDANSETRON 4 MG PO TBDP: 4.0000 mg | ORAL_TABLET | Freq: Three times a day (TID) | ORAL | 0 refills | Status: AC | PRN

## 2024-01-02 NOTE — ED Provider Notes (Signed)
duplicate   Jene Every, MD 01/02/24 1000

## 2024-01-02 NOTE — ED Provider Notes (Signed)
[facility] Provider Note    Event Date/Time   First MD Initiated Contact with Patient 01/02/24 [phone]     (approximate)   History   Emesis   HPI  Ethan Wells is a 53 y.o. male with extensive history including coronary artery disease, cardiac arrest, cocaine abuse, polysubstance abuse, COPD who presents with complaints of nausea for nearly a month.  He reports alcohol dependence seems to be related to withdrawal symptoms.  Additionally he reported chest discomfort to triage nurse but nontoxic   Physical Exam   Triage Vital Signs: ED Triage Vitals  Encounter Vitals Group     BP 01/02/24 0328 128/68     Systolic BP Percentile --      Diastolic BP Percentile --      Pulse Rate 01/02/24 0328 90     Resp 01/02/24 0328 (!) 22     Temp 01/02/24 0328 97.8 F (36.6 C)     Temp Source 01/02/24 0328 Oral     SpO2 01/02/24 0328 98 %     Weight 01/02/24 0328 83 kg (183 lb)     Height 01/02/24 0328 1.651 m (5\' 5" )     Head Circumference --      Peak Flow --      Pain Score 01/02/24 0326 8     Pain Loc --      Pain Education --      Exclude from Growth Chart --     Most recent vital signs: Vitals:   01/02/24 0328  BP: 128/68  Pulse: 90  Resp: (!) 22  Temp: 97.8 F (36.6 C)  SpO2: 98%     General: Awake, no distress.  CV:  Good peripheral perfusion.  Resp:  Normal effort.  Abd:  No distention.  Other:     ED Results / Procedures / Treatments   Labs (all labs ordered are listed, but only abnormal results are displayed) Labs Reviewed  BASIC METABOLIC PANEL - Abnormal; Notable for the following components:      Result Value   Potassium 3.4 (*)    CO2 21 (*)    Glucose, Bld 110 (*)    BUN <5 (*)    Creatinine, Ser 0.55 (*)    Calcium 8.6 (*)    All other components within normal limits  CBC - Abnormal; Notable for the following components:   RBC 3.57 (*)    Hemoglobin 10.8 (*)    HCT 32.5 (*)    RDW 16.9 (*)    Platelets 130  (*)    All other components within normal limits  TROPONIN I (HIGH SENSITIVITY) - Abnormal; Notable for the following components:   Troponin I (High Sensitivity) 20 (*)    All other components within normal limits  TROPONIN I (HIGH SENSITIVITY) - Abnormal; Notable for the following components:   Troponin I (High Sensitivity) 21 (*)    All other components within normal limits     EKG  ED ECG REPORT I, Jene Every, the attending physician, personally viewed and interpreted this ECG.  Date: 01/02/2024  Rhythm: normal sinus rhythm QRS Axis: normal Intervals: normal ST/T Wave abnormalities: Nonspecific changes Narrative Interpretation: no evidence of acute ischemia    RADIOLOGY Chest x-ray viewed interpret by me, no acute abnormality    PROCEDURES:  Critical Care performed:   Procedures   MEDICATIONS ORDERED IN ED: Medications - No data to display   IMPRESSION / MDM / ASSESSMENT AND PLAN / ED  COURSE  I reviewed the triage vital signs and the nursing notes. Patient's presentation is most consistent with severe exacerbation of chronic illness.  Patient presents with multiple complaints as detailed above, no complaints of chest pain to me, reassuring troponin x 2, EKG unchanged.  Appears his primary complaint is alcohol dependence, is requesting assistance with this.  I suspect his nausea has something to do with alcohol withdrawal.  No abdominal tenderness.  Lab work overall reassuring  Discussed with him that we will prescribe Librium taper for alcohol withdrawal, and refer to [facility], PCP referral placed as well for multiple chronic complaints        FINAL CLINICAL IMPRESSION(S) / ED DIAGNOSES   Final diagnoses:  Uncomplicated alcohol dependence (HCC)     Rx / DC Orders   ED Discharge Orders          Ordered    chlordiazePOXIDE (LIBRIUM) 25 MG capsule  Multiple Frequencies        01/02/24 0941    ondansetron (ZOFRAN-ODT) 4 MG disintegrating tablet  Every  8 hours PRN        01/02/24 0941    Ambulatory Referral to Primary Care (Establish Care)        01/02/24 0941             Note:  This document was prepared using Dragon voice recognition software and may include unintentional dictation errors.   Jene Every, MD 01/02/24 [phone]

## 2024-01-02 NOTE — ED Notes (Signed)
See triage notes. Patient c/o N/V/D, chest pain and shortness of breath.

## 2024-01-02 NOTE — ED Triage Notes (Signed)
Pt presents via EMS c/o N/V/D  Pt also endorses chest pain and SOB.   Pt is ambulatory with 02 saturation 98%, pt in no apparent respiratory distress.   A&O x4.

## 2024-01-03 ENCOUNTER — Emergency Department: Payer: Medicare HMO

## 2024-01-03 ENCOUNTER — Other Ambulatory Visit: Payer: Self-pay

## 2024-01-03 ENCOUNTER — Emergency Department
Admission: EM | Admit: 2024-01-03 | Discharge: 2024-01-03 | Disposition: A | Discharge status: Home / Self Care | Payer: Medicare HMO | Attending: Emergency Medicine | Admitting: Emergency Medicine

## 2024-01-03 DIAGNOSIS — R000 Tachycardia, unspecified: Secondary | ICD-10-CM | POA: Diagnosis not present

## 2024-01-03 DIAGNOSIS — K7689 Other specified diseases of liver: Secondary | ICD-10-CM | POA: Diagnosis not present

## 2024-01-03 DIAGNOSIS — R079 Chest pain, unspecified: Secondary | ICD-10-CM | POA: Diagnosis not present

## 2024-01-03 DIAGNOSIS — R0789 Other chest pain: Secondary | ICD-10-CM | POA: Diagnosis present

## 2024-01-03 DIAGNOSIS — J449 Chronic obstructive pulmonary disease, unspecified: Secondary | ICD-10-CM | POA: Insufficient documentation

## 2024-01-03 DIAGNOSIS — R1013 Epigastric pain: Secondary | ICD-10-CM | POA: Insufficient documentation

## 2024-01-03 DIAGNOSIS — I700 Atherosclerosis of aorta: Secondary | ICD-10-CM | POA: Diagnosis not present

## 2024-01-03 DIAGNOSIS — R112 Nausea with vomiting, unspecified: Secondary | ICD-10-CM | POA: Diagnosis not present

## 2024-01-03 DIAGNOSIS — K7460 Unspecified cirrhosis of liver: Secondary | ICD-10-CM | POA: Diagnosis not present

## 2024-01-03 LAB — CBC
HCT: 30.7 % — ABNORMAL LOW (ref 39.0–52.0)
Hemoglobin: 9.9 g/dL — ABNORMAL LOW (ref 13.0–17.0)
MCH: 30.1 pg (ref 26.0–34.0)
MCHC: 32.2 g/dL (ref 30.0–36.0)
MCV: 93.3 fL (ref 80.0–100.0)
Platelets: 109 10*3/uL — ABNORMAL LOW (ref 150–400)
RBC: 3.29 MIL/uL — ABNORMAL LOW (ref 4.22–5.81)
RDW: 16.8 % — ABNORMAL HIGH (ref 11.5–15.5)
WBC: 6.5 10*3/uL (ref 4.0–10.5)
nRBC: 0 % (ref 0.0–0.2)

## 2024-01-03 LAB — COMPREHENSIVE METABOLIC PANEL
ALT: 19 U/L (ref 0–44)
AST: 73 U/L — ABNORMAL HIGH (ref 15–41)
Albumin: 3.3 g/dL — ABNORMAL LOW (ref 3.5–5.0)
Alkaline Phosphatase: 194 U/L — ABNORMAL HIGH (ref 38–126)
Anion gap: 15 (ref 5–15)
BUN: 5 mg/dL — ABNORMAL LOW (ref 6–20)
CO2: 20 mmol/L — ABNORMAL LOW (ref 22–32)
Calcium: 8.4 mg/dL — ABNORMAL LOW (ref 8.9–10.3)
Chloride: 106 mmol/L (ref 98–111)
Creatinine, Ser: 0.6 mg/dL — ABNORMAL LOW (ref 0.61–1.24)
GFR, Estimated: >60 mL/min (ref >60)
Glucose, Bld: 111 mg/dL — ABNORMAL HIGH (ref 70–99)
Potassium: 3.2 mmol/L — ABNORMAL LOW (ref 3.5–5.1)
Sodium: 141 mmol/L (ref 135–145)
Total Bilirubin: 2.8 mg/dL — ABNORMAL HIGH (ref 0.0–1.2)
Total Protein: 7.6 g/dL (ref 6.5–8.1)

## 2024-01-03 LAB — TROPONIN I (HIGH SENSITIVITY)
Troponin I (High Sensitivity): 16 ng/L (ref <18)
Troponin I (High Sensitivity): 16 ng/L (ref <18)

## 2024-01-03 LAB — LIPASE, BLOOD: Lipase: 49 U/L (ref 11–51)

## 2024-01-03 LAB — MAGNESIUM: Magnesium: 1.6 mg/dL — ABNORMAL LOW (ref 1.7–2.4)

## 2024-01-03 MED ORDER — IOHEXOL 350 MG/ML SOLN
100.0000 mL | Freq: Once | INTRAVENOUS | Status: AC | PRN
  Administered 2024-01-03: 100 mL via INTRAVENOUS

## 2024-01-03 MED ORDER — ASPIRIN 81 MG PO CHEW
324.0000 mg | CHEWABLE_TABLET | Freq: Once | ORAL | Status: AC
  Administered 2024-01-03: 324 mg via ORAL
  Filled 2024-01-03: qty 4

## 2024-01-03 MED ORDER — POTASSIUM CHLORIDE CRYS ER 20 MEQ PO TBCR
40.0000 meq | EXTENDED_RELEASE_TABLET | Freq: Once | ORAL | Status: AC
  Administered 2024-01-03: 40 meq via ORAL
  Filled 2024-01-03: qty 2

## 2024-01-03 NOTE — ED Notes (Signed)
This RN attempted IV twice. Unsuccessful. Will place IV team order.

## 2024-01-03 NOTE — ED Notes (Signed)
Patient transported to X-ray 

## 2024-01-03 NOTE — ED Provider Notes (Signed)
[facility] Provider Note    Event Date/Time   First MD Initiated Contact with Patient 01/03/24 0142     (approximate)   History   Chest Pain   HPI  Ethan Wells is a 53 y.o. male with a history of previous coronary disease previous cardiac arrest, COPD cirrhosis.  Patient reports that has been having over a month of pain that he describes as "chest pain" but is not his descriptor of it he reports it is in his upper abdomen radiates to his back and also up into his chest.  Is been ongoing for several days he came to the ER earlier today for the same but reports it is not improving.  Reports treatments including medications aspirin nitroglycerin are not helpful.  There is no vomiting.  No fevers or chills no double trouble breathing.  He reports the pain will not subside and is the same pain he has been experiencing for over a month   It seems like it is now radiating into his back  Physical Exam   Triage Vital Signs: ED Triage Vitals  Encounter Vitals Group     BP 01/03/24 0106 137/76     Systolic BP Percentile --      Diastolic BP Percentile --      Pulse Rate 01/03/24 0106 91     Resp 01/03/24 0106 19     Temp 01/03/24 0106 98.1 F (36.7 C)     Temp Source 01/03/24 0106 Oral     SpO2 01/03/24 0106 98 %     Weight 01/03/24 0107 183 lb (83 kg)     Height 01/03/24 0107 5\' 5"  (1.651 m)     Head Circumference --      Peak Flow --      Pain Score 01/03/24 0106 9     Pain Loc --      Pain Education --      Exclude from Growth Chart --     Most recent vital signs: Vitals:   01/03/24 0106 01/03/24 0458  BP: 137/76 113/66  Pulse: 91 79  Resp: 19 16  Temp: 98.1 F (36.7 C) 97.9 F (36.6 C)  SpO2: 98% 99%     General: Awake, no distress.  CV:  Good peripheral perfusion.  Normal tones and rate Resp:  Normal effort.  Clear bilateral Abd:  No distention.  Soft not obviously tender or distended.  No rebound or guarding.  Somewhat  protuberant abdomen but no pain or discomfort to palpation in any region.  He points to his epigastrium and reports the pain goes through to his back and up towards his mid chest Other:     ED Results / Procedures / Treatments   Labs (all labs ordered are listed, but only abnormal results are displayed) Labs Reviewed  CBC - Abnormal; Notable for the following components:      Result Value   RBC 3.29 (*)    Hemoglobin 9.9 (*)    HCT 30.7 (*)    RDW 16.8 (*)    Platelets 109 (*)    All other components within normal limits  COMPREHENSIVE METABOLIC PANEL - Abnormal; Notable for the following components:   Potassium 3.2 (*)    CO2 20 (*)    Glucose, Bld 111 (*)    BUN 5 (*)    Creatinine, Ser 0.60 (*)    Calcium 8.4 (*)    Albumin 3.3 (*)    AST 73 (*)  Alkaline Phosphatase 194 (*)    Total Bilirubin 2.8 (*)    All other components within normal limits  MAGNESIUM - Abnormal; Notable for the following components:   Magnesium 1.6 (*)    All other components within normal limits  LIPASE, BLOOD  URINALYSIS, ROUTINE W REFLEX MICROSCOPIC  TROPONIN I (HIGH SENSITIVITY)  TROPONIN I (HIGH SENSITIVITY)   Labs demonstrate a chronic anemia and chronic thrombocytopenia  Also transaminases show a slightly elevated AST can consistent with previous.  Mild hypokalemia potassium 3.2.  Initial troponin is normal.  Repeat troponin is also normal  EKG  EKG interpreted by me at 115 heart rate 90 QRS 90 QTc 480 Normal sinus rhythm no evidence of acute ischemia.  Borderline prolongation of QT   RADIOLOGY  CT Angio Chest/Abd/Pel for Dissection W and/or Wo Contrast Result Date: 01/03/2024 CLINICAL DATA:  53 year old male with history of epigastric pain radiating to the back and chest. Suspected aortic aneurism. EXAM: CT ANGIOGRAPHY CHEST, ABDOMEN AND PELVIS TECHNIQUE: Non-contrast CT of the chest was initially obtained. Multidetector CT imaging through the chest, abdomen and pelvis was  performed using the standard protocol during bolus administration of intravenous contrast. Multiplanar reconstructed images and MIPs were obtained and reviewed to evaluate the vascular anatomy. RADIATION DOSE REDUCTION: This exam was performed according to the departmental dose-optimization program which includes automated exposure control, adjustment of the mA and/or kV according to patient size and/or use of iterative reconstruction technique. CONTRAST:  OMNIPAQUE IOHEXOL 350 MG/ML SOLN COMPARISON:  Chest CTA 12/12/2023. CTA of the chest, abdomen and pelvis 11/14/2023. FINDINGS: CTA CHEST FINDINGS Cardiovascular: Precontrast images demonstrate no crescentic high attenuation associated with the wall of the thoracic aorta to suggest the presence of acute intramural hemorrhage. Postcontrast images demonstrate no evidence of thoracic aortic aneurysm or dissection. Thoracic aorta is normal in caliber measuring 3.1 cm, 2.4 cm and 2.0 cm in diameter in the ascending, mid aortic arch and descending thoracic aorta respectively. Dilatation of the pulmonic trunk (3.7 cm in diameter). There is aortic atherosclerosis, as well as atherosclerosis of the great vessels of the mediastinum and the coronary arteries, including calcified atherosclerotic plaque in the left main and left anterior descending coronary arteries. Mediastinum/Nodes: No pathologically enlarged mediastinal or hilar lymph nodes. Hilar esophagus is unremarkable in appearance. No axillary lymphadenopathy. Lungs/Pleura: 5 mm subpleural nodule in the right middle lobe (axial image 83 of series 6), stable dating back to at least 05/09/2023, nonspecific, but statistically likely a benign subpleural lymph node (no imaging follow-up recommended). No other larger more suspicious appearing pulmonary nodules or masses are noted. No acute consolidative airspace disease. No pleural effusions. Musculoskeletal: There are no aggressive appearing lytic or blastic lesions  noted in the visualized portions of the skeleton. Review of the MIP images confirms the above findings. CTA ABDOMEN AND PELVIS FINDINGS VASCULAR Aorta: Aortic atherosclerosis. Normal caliber aorta without aneurysm, dissection, vasculitis or significant stenosis. Celiac: Stent in the proximal celiac axis, patent. Patent without evidence of aneurysm, dissection, vasculitis or significant stenosis. SMA: 2 stents in the proximal superior mesenteric artery, patent. Patent without evidence of aneurysm, dissection, vasculitis or significant stenosis. Renals: Single right and 2 left renal arteries (small accessory branch to the left lower pole incidentally noted). All renal arteries are patent without evidence of aneurysm, dissection, vasculitis, fibromuscular dysplasia or significant stenosis. IMA: Patent without evidence of aneurysm, dissection, vasculitis or significant stenosis. Inflow: Patent without evidence of aneurysm, dissection, vasculitis or significant stenosis. Veins: No obvious venous abnormality within  the limitations of this arterial phase study. Review of the MIP images confirms the above findings. NON-VASCULAR Hepatobiliary: Liver has a shrunken appearance and nodular contour, indicative of underlying cirrhosis. TIPS noted, patency of which is not assessable on today's arterial phase examination. No definite suspicious cystic or solid hepatic lesions. No intra or extrahepatic biliary ductal dilatation. Gallbladder is unremarkable in appearance. Pancreas: No pancreatic mass. No pancreatic ductal dilatation. No pancreatic or peripancreatic fluid collections or inflammatory changes. Spleen: Unremarkable. Adrenals/Urinary Tract: Bilateral kidneys and bilateral adrenal glands are normal in appearance. No hydroureteronephrosis. Urinary bladder is unremarkable in appearance. Stomach/Bowel: The appearance of the stomach is normal. No pathologic dilatation of small bowel or colon. The appendix is not confidently  identified and may be surgically absent. Regardless, there are no inflammatory changes noted adjacent to the cecum to suggest the presence of an acute appendicitis at this time. Lymphatic: No lymphadenopathy noted in the abdomen or pelvis. Reproductive: Prostate gland and seminal vesicles are unremarkable in appearance. Other: No significant volume of ascites.  No pneumoperitoneum. Musculoskeletal: There are no aggressive appearing lytic or blastic lesions noted in the visualized portions of the skeleton. Review of the MIP images confirms the above findings. IMPRESSION: 1. No acute findings are noted in the chest, abdomen or pelvis to account for the patient's symptoms. Specifically, no evidence of thoracic or abdominal aortic aneurysm or dissection. 2. Aortic atherosclerosis, in addition to left main and left anterior descending coronary artery disease. 3. Dilatation of the pulmonic trunk (3.7 cm in diameter), suggesting pulmonary arterial hypertension. 4. Cirrhosis with TIPS, patency of which is not assessable on today's arterial phase examination. No significant volume of ascites. Aortic Atherosclerosis (ICD10-I70.0). Electronically Signed   By: Trudie Reed M.D.   On: 01/03/2024 05:34   DG Chest 2 View Result Date: 01/03/2024 CLINICAL DATA:  Chest pain EXAM: CHEST - 2 VIEW COMPARISON:  None Available. FINDINGS: Heart and mediastinal contours are within normal limits. No focal opacities or effusions. No acute bony abnormality. IMPRESSION: No active cardiopulmonary disease. Electronically Signed   By: Charlett Nose M.D.   On: 01/03/2024 01:51      PROCEDURES:  Critical Care performed: No  Procedures   MEDICATIONS ORDERED IN ED: Medications  aspirin chewable tablet 324 mg (324 mg Oral Given 01/03/24 0140)  potassium chloride SA (KLOR-CON M) CR tablet 40 mEq (40 mEq Oral Given 01/03/24 0502)  iohexol (OMNIPAQUE) 350 MG/ML injection 100 mL (100 mLs Intravenous Contrast Given 01/03/24 0432)      IMPRESSION / MDM / ASSESSMENT AND PLAN / ED COURSE  I reviewed the triage vital signs and the nursing notes.                              Differential diagnosis includes, but is not limited to, ACS, aortic dissection, pulmonary embolism, cardiac tamponade, pneumothorax, pneumonia, pericarditis, myocarditis, GI-related causes including esophagitis/gastritis, and musculoskeletal chest wall pain.    The fact that his chest pain has been present for over a month at least what he describes as a chest pain that seems more of an epigastric pain seems somewhat chronic, but he reports it is continuing to worsen despite being seen in the ER earlier today as well.  He is awake alert with no apparent distress.  Given the radiation to the back and his previous clinical history including hepatobiliary disease, they ordered CT scan to further evaluate and exclude causes such as dissection thromboembolism  etc.  His initial troponin is normal and his cardiac testing earlier today was also reassuring  He recently had cardiac evaluation admission for NSTEMI, at that point decision was made for medical management who was not a candidate for cardiac catheterization at that time.  Patient's presentation is most consistent with acute complicated illness / injury requiring diagnostic workup.   ----------------------------------------- 6:16 AM on 01/03/2024 ----------------------------------------- Patient resting comfortably, asleep.  He arouses easily to voice.  Discussed his reassuring workup at this point, given his ongoing improved status, normal hemodynamics, and reassuring workup including repeat troponins and CT imaging will discharge the patient to follow-up with Mrs. Hammock NP. Return precautions and treatment recommendations and follow-up discussed with the patient who is agreeable with the plan.        FINAL CLINICAL IMPRESSION(S) / ED DIAGNOSES   Final diagnoses:  Chest pain, atypical      Rx / DC Orders   ED Discharge Orders          Ordered    Ambulatory referral to Cardiology        01/03/24 0608             Note:  This document was prepared using Dragon voice recognition software and may include unintentional dictation errors.   Sharyn Creamer, MD 01/03/24 [phone]

## 2024-01-03 NOTE — ED Notes (Signed)
Pt was brought back to triage by XRAY tech. Pt stating "I can't breath I can't breath". Pt is pink, warm and dry. Pt oxygen is 100% at this time. Pt is speaking in complete sentences.

## 2024-01-03 NOTE — ED Triage Notes (Addendum)
Pt arrives via EMS with c/o "pain all over" such as chest, abdomen, back and legs as well as a migraine. He states he was here on 1/17 and the pain has not gone away. He also requesting help with alcohol problem, states he drank 24oz of a wine cooler today; states he usually drinks "as much as I can" every day. Denies SI/HI. Hx of MI in 2012 with stent placement.

## 2024-01-04 DIAGNOSIS — Z5321 Procedure and treatment not carried out due to patient leaving prior to being seen by health care provider: Secondary | ICD-10-CM | POA: Diagnosis not present

## 2024-01-04 DIAGNOSIS — R0602 Shortness of breath: Secondary | ICD-10-CM | POA: Diagnosis not present

## 2024-01-05 DIAGNOSIS — R9431 Abnormal electrocardiogram [ECG] [EKG]: Secondary | ICD-10-CM | POA: Diagnosis not present

## 2024-01-05 DIAGNOSIS — R0602 Shortness of breath: Secondary | ICD-10-CM | POA: Diagnosis not present

## 2024-01-07 ENCOUNTER — Emergency Department
Admission: EM | Admit: 2024-01-07 | Discharge: 2024-01-07 | Disposition: A | Discharge status: Home / Self Care | Payer: Medicare HMO | Source: Home / Self Care | Attending: Emergency Medicine | Admitting: Emergency Medicine

## 2024-01-07 ENCOUNTER — Encounter: Payer: Self-pay | Admitting: *Deleted

## 2024-01-07 ENCOUNTER — Other Ambulatory Visit: Payer: Self-pay

## 2024-01-07 ENCOUNTER — Emergency Department: Payer: Medicare HMO

## 2024-01-07 DIAGNOSIS — J029 Acute pharyngitis, unspecified: Secondary | ICD-10-CM

## 2024-01-07 DIAGNOSIS — R1084 Generalized abdominal pain: Secondary | ICD-10-CM | POA: Diagnosis not present

## 2024-01-07 DIAGNOSIS — D696 Thrombocytopenia, unspecified: Secondary | ICD-10-CM | POA: Diagnosis present

## 2024-01-07 DIAGNOSIS — Z7951 Long term (current) use of inhaled steroids: Secondary | ICD-10-CM | POA: Insufficient documentation

## 2024-01-07 DIAGNOSIS — E876 Hypokalemia: Secondary | ICD-10-CM | POA: Diagnosis present

## 2024-01-07 DIAGNOSIS — F1410 Cocaine abuse, uncomplicated: Secondary | ICD-10-CM | POA: Diagnosis present

## 2024-01-07 DIAGNOSIS — Z720 Tobacco use: Secondary | ICD-10-CM | POA: Insufficient documentation

## 2024-01-07 DIAGNOSIS — Y908 Blood alcohol level of 240 mg/100 ml or more: Secondary | ICD-10-CM | POA: Diagnosis present

## 2024-01-07 DIAGNOSIS — I272 Pulmonary hypertension, unspecified: Secondary | ICD-10-CM | POA: Diagnosis present

## 2024-01-07 DIAGNOSIS — I10 Essential (primary) hypertension: Secondary | ICD-10-CM | POA: Insufficient documentation

## 2024-01-07 DIAGNOSIS — D509 Iron deficiency anemia, unspecified: Secondary | ICD-10-CM | POA: Diagnosis present

## 2024-01-07 DIAGNOSIS — I509 Heart failure, unspecified: Secondary | ICD-10-CM | POA: Insufficient documentation

## 2024-01-07 DIAGNOSIS — Z8674 Personal history of sudden cardiac arrest: Secondary | ICD-10-CM

## 2024-01-07 DIAGNOSIS — Y92009 Unspecified place in unspecified non-institutional (private) residence as the place of occurrence of the external cause: Secondary | ICD-10-CM

## 2024-01-07 DIAGNOSIS — E871 Hypo-osmolality and hyponatremia: Secondary | ICD-10-CM | POA: Diagnosis present

## 2024-01-07 DIAGNOSIS — Z79899 Other long term (current) drug therapy: Secondary | ICD-10-CM

## 2024-01-07 DIAGNOSIS — E8729 Other acidosis: Secondary | ICD-10-CM | POA: Diagnosis present

## 2024-01-07 DIAGNOSIS — I2510 Atherosclerotic heart disease of native coronary artery without angina pectoris: Secondary | ICD-10-CM | POA: Insufficient documentation

## 2024-01-07 DIAGNOSIS — I214 Non-ST elevation (NSTEMI) myocardial infarction: Secondary | ICD-10-CM | POA: Diagnosis present

## 2024-01-07 DIAGNOSIS — Z1152 Encounter for screening for COVID-19: Secondary | ICD-10-CM

## 2024-01-07 DIAGNOSIS — Z8249 Family history of ischemic heart disease and other diseases of the circulatory system: Secondary | ICD-10-CM

## 2024-01-07 DIAGNOSIS — E669 Obesity, unspecified: Secondary | ICD-10-CM | POA: Diagnosis present

## 2024-01-07 DIAGNOSIS — Z20822 Contact with and (suspected) exposure to covid-19: Secondary | ICD-10-CM | POA: Insufficient documentation

## 2024-01-07 DIAGNOSIS — K7010 Alcoholic hepatitis without ascites: Secondary | ICD-10-CM | POA: Diagnosis present

## 2024-01-07 DIAGNOSIS — R112 Nausea with vomiting, unspecified: Secondary | ICD-10-CM | POA: Diagnosis not present

## 2024-01-07 DIAGNOSIS — I252 Old myocardial infarction: Secondary | ICD-10-CM

## 2024-01-07 DIAGNOSIS — J449 Chronic obstructive pulmonary disease, unspecified: Secondary | ICD-10-CM | POA: Insufficient documentation

## 2024-01-07 DIAGNOSIS — R000 Tachycardia, unspecified: Secondary | ICD-10-CM | POA: Diagnosis not present

## 2024-01-07 DIAGNOSIS — T405X1A Poisoning by cocaine, accidental (unintentional), initial encounter: Principal | ICD-10-CM | POA: Diagnosis present

## 2024-01-07 DIAGNOSIS — I110 Hypertensive heart disease with heart failure: Secondary | ICD-10-CM | POA: Diagnosis present

## 2024-01-07 DIAGNOSIS — R0789 Other chest pain: Secondary | ICD-10-CM | POA: Diagnosis not present

## 2024-01-07 DIAGNOSIS — I9589 Other hypotension: Secondary | ICD-10-CM | POA: Diagnosis present

## 2024-01-07 DIAGNOSIS — I5032 Chronic diastolic (congestive) heart failure: Secondary | ICD-10-CM | POA: Diagnosis present

## 2024-01-07 DIAGNOSIS — Z6831 Body mass index (BMI) 31.0-31.9, adult: Secondary | ICD-10-CM

## 2024-01-07 DIAGNOSIS — K7030 Alcoholic cirrhosis of liver without ascites: Secondary | ICD-10-CM | POA: Diagnosis present

## 2024-01-07 DIAGNOSIS — J209 Acute bronchitis, unspecified: Secondary | ICD-10-CM | POA: Diagnosis present

## 2024-01-07 DIAGNOSIS — Z811 Family history of alcohol abuse and dependence: Secondary | ICD-10-CM

## 2024-01-07 DIAGNOSIS — E272 Addisonian crisis: Secondary | ICD-10-CM | POA: Diagnosis not present

## 2024-01-07 DIAGNOSIS — E785 Hyperlipidemia, unspecified: Secondary | ICD-10-CM | POA: Diagnosis present

## 2024-01-07 DIAGNOSIS — J101 Influenza due to other identified influenza virus with other respiratory manifestations: Secondary | ICD-10-CM | POA: Diagnosis not present

## 2024-01-07 DIAGNOSIS — Z955 Presence of coronary angioplasty implant and graft: Secondary | ICD-10-CM

## 2024-01-07 DIAGNOSIS — F1010 Alcohol abuse, uncomplicated: Secondary | ICD-10-CM | POA: Diagnosis not present

## 2024-01-07 DIAGNOSIS — J441 Chronic obstructive pulmonary disease with (acute) exacerbation: Secondary | ICD-10-CM | POA: Diagnosis present

## 2024-01-07 DIAGNOSIS — Z79891 Long term (current) use of opiate analgesic: Secondary | ICD-10-CM

## 2024-01-07 DIAGNOSIS — F10929 Alcohol use, unspecified with intoxication, unspecified: Secondary | ICD-10-CM | POA: Diagnosis not present

## 2024-01-07 DIAGNOSIS — R0602 Shortness of breath: Secondary | ICD-10-CM | POA: Diagnosis not present

## 2024-01-07 DIAGNOSIS — I255 Ischemic cardiomyopathy: Secondary | ICD-10-CM | POA: Diagnosis present

## 2024-01-07 DIAGNOSIS — R059 Cough, unspecified: Secondary | ICD-10-CM | POA: Diagnosis not present

## 2024-01-07 DIAGNOSIS — J440 Chronic obstructive pulmonary disease with acute lower respiratory infection: Secondary | ICD-10-CM | POA: Diagnosis present

## 2024-01-07 DIAGNOSIS — Z87891 Personal history of nicotine dependence: Secondary | ICD-10-CM

## 2024-01-07 DIAGNOSIS — R739 Hyperglycemia, unspecified: Secondary | ICD-10-CM | POA: Diagnosis present

## 2024-01-07 DIAGNOSIS — B349 Viral infection, unspecified: Secondary | ICD-10-CM

## 2024-01-07 DIAGNOSIS — F10120 Alcohol abuse with intoxication, uncomplicated: Secondary | ICD-10-CM | POA: Diagnosis present

## 2024-01-07 DIAGNOSIS — F10129 Alcohol abuse with intoxication, unspecified: Secondary | ICD-10-CM | POA: Diagnosis not present

## 2024-01-07 LAB — CBC
HCT: 30.7 % — ABNORMAL LOW (ref 39.0–52.0)
Hemoglobin: 10 g/dL — ABNORMAL LOW (ref 13.0–17.0)
MCH: 29.9 pg (ref 26.0–34.0)
MCHC: 32.6 g/dL (ref 30.0–36.0)
MCV: 91.6 fL (ref 80.0–100.0)
Platelets: 93 10*3/uL — ABNORMAL LOW (ref 150–400)
RBC: 3.35 MIL/uL — ABNORMAL LOW (ref 4.22–5.81)
RDW: 16.5 % — ABNORMAL HIGH (ref 11.5–15.5)
WBC: 4.4 10*3/uL (ref 4.0–10.5)
nRBC: 0 % (ref 0.0–0.2)

## 2024-01-07 LAB — RESP PANEL BY RT-PCR (RSV, FLU A&B, COVID)  RVPGX2
Influenza A by PCR: NEGATIVE
Influenza B by PCR: NEGATIVE
Resp Syncytial Virus by PCR: NEGATIVE
SARS Coronavirus 2 by RT PCR: NEGATIVE

## 2024-01-07 LAB — TROPONIN I (HIGH SENSITIVITY): Troponin I (High Sensitivity): 16 ng/L (ref <18)

## 2024-01-07 LAB — BASIC METABOLIC PANEL
Anion gap: 20 — ABNORMAL HIGH (ref 5–15)
BUN: 8 mg/dL (ref 6–20)
CO2: 15 mmol/L — ABNORMAL LOW (ref 22–32)
Calcium: 8.3 mg/dL — ABNORMAL LOW (ref 8.9–10.3)
Chloride: 103 mmol/L (ref 98–111)
Creatinine, Ser: 0.56 mg/dL — ABNORMAL LOW (ref 0.61–1.24)
GFR, Estimated: >60 mL/min (ref >60)
Glucose, Bld: 215 mg/dL — ABNORMAL HIGH (ref 70–99)
Potassium: 3.2 mmol/L — ABNORMAL LOW (ref 3.5–5.1)
Sodium: 138 mmol/L (ref 135–145)

## 2024-01-07 MED ORDER — MAGIC MOUTHWASH: ORAL | 0 refills | Status: AC

## 2024-01-07 MED ORDER — HYDROCOD POLI-CHLORPHE POLI ER 10-8 MG/5ML PO SUER
5.0000 mL | Freq: Once | ORAL | Status: AC
  Administered 2024-01-07: 5 mL via ORAL
  Filled 2024-01-07: qty 5

## 2024-01-07 MED ORDER — DEXAMETHASONE SODIUM PHOSPHATE 10 MG/ML IJ SOLN
10.0000 mg | Freq: Once | INTRAMUSCULAR | Status: AC
  Administered 2024-01-07: 10 mg via INTRAMUSCULAR
  Filled 2024-01-07: qty 1

## 2024-01-07 MED ORDER — IPRATROPIUM-ALBUTEROL 0.5-2.5 (3) MG/3ML IN SOLN
3.0000 mL | Freq: Once | RESPIRATORY_TRACT | Status: AC
  Administered 2024-01-07: 3 mL via RESPIRATORY_TRACT
  Filled 2024-01-07: qty 3

## 2024-01-07 MED ORDER — HYDROCOD POLI-CHLORPHE POLI ER 10-8 MG/5ML PO SUER: 5.0000 mL | Freq: Two times a day (BID) | ORAL | 0 refills | Status: AC | PRN

## 2024-01-07 MED ORDER — MAGIC MOUTHWASH
10.0000 mL | Freq: Once | ORAL | Status: AC
  Administered 2024-01-07: 10 mL via ORAL
  Filled 2024-01-07: qty 10

## 2024-01-07 MED ORDER — ACETAMINOPHEN 325 MG PO TABS
650.0000 mg | ORAL_TABLET | Freq: Once | ORAL | Status: AC
  Administered 2024-01-07: 650 mg via ORAL
  Filled 2024-01-07: qty 2

## 2024-01-07 NOTE — ED Triage Notes (Addendum)
Pt to ED via EMS from home, pt reports cough and generalized body aches. Pt has been seen multiple times recently for same. Pt reports ETOH tonight.

## 2024-01-07 NOTE — ED Triage Notes (Signed)
Pt brought in via ems from home.  Pt has chest pain and sob.  Pt states pain goes into both arms and legs. Pt out of bp meds for 1 month.  Pt alert  speech clear.

## 2024-01-07 NOTE — ED Provider Notes (Signed)
[facility] Provider Note    Event Date/Time   First MD Initiated Contact with Patient 01/07/24 0127     (approximate)   History   Generalized Body Aches   HPI  Ethan Wells is a 53 y.o. male brought to the ED in home with a chief complaint of cough and generalized bodyaches.  Patient is well-known to the emergency department with multiple ED visits for EtOH.  He does have a medical history of CAD, CHF, cocaine abuse, EtOH abuse.  Endorses a several day history of sore throat, nonproductive cough and generalized bodyaches.  Denies fever/chills, chest pain, shortness of breath, abdominal pain, nausea, vomiting or diarrhea.  Past Medical History   Past Medical History:  Diagnosis Date   (HFpEF) heart failure with preserved ejection fraction (HCC)    Addison disease (HCC)    CAD (coronary artery disease)    a. 01/2011 Anterior STEMI/Cath/PCI: LM nl, LAD 100d (3.5x21mm Vision BMS placed), LCX 45m, RI 50, RCA min irregs, EF 40% w/ apical, inferoapical HK; b. 10/2022 NSTSEMI/Cath: LM nl, LAD 5p/m, RI 85, LCX 146m, OM1 mild dzs, OM3 100 fills via L->L collats from dLAD, RCA 80p, 146m, RPDA fills via collats from LAD. EF 45-50%-->Med Rx.   CAD, multiple vessel    Cardiac arrest - ventricular fibrillation    a. In setting of STEMI 01/2011   Chronic, continuous use of opioids    Cocaine abuse (HCC)    COPD (chronic obstructive pulmonary disease) (HCC)    Depression with anxiety    ETOH abuse    a. 6-12 beers/day   GERD (gastroesophageal reflux disease)    GI bleed    Hemorrhoids    Hyperlipidemia    Hypertension    Ischemic cardiomyopathy    a. 06/2011 Echo: EF 45-50%, No rwma; b. 10/2022 Echo: EF 55-60%, no rwma, nl RV fxn.   Liver cirrhosis, alcoholic (HCC)    Marijuana abuse    Migraines    Noncompliance with immunization regimen    Obesity (BMI 30.0-34.9)    Orthostatic hypertension    midodrine   Portal hypertension (HCC)    s/p TIPS   STEMI  (ST elevation myocardial infarction) (HCC) 2012   Tobacco abuse    a. 1/2 ppd x 26 yrs     Active Problem List   Patient Active Problem List   Diagnosis Date Noted   NSTEMI (non-ST elevated myocardial infarction) (HCC) 12/12/2023   Essential hypertension 12/12/2023   Precordial chest pain 11/23/2023   COPD (chronic obstructive pulmonary disease) (HCC) 11/23/2023   COPD exacerbation (HCC) 10/16/2023   Hepatic cirrhosis (HCC) 09/03/2023   Acute metabolic encephalopathy 08/11/2023   Acute blood loss anemia (ABLA) 08/08/2023   Elevated liver function tests 07/02/2023   Pancytopenia (HCC) 07/01/2023   Metabolic acidosis 07/01/2023   Liver mass 06/30/2023   Nausea vomiting and diarrhea 06/30/2023   Migraine 06/30/2023   Cholecystitis, acute 05/10/2023   UTI (urinary tract infection) 05/05/2023   Chronic diastolic CHF (congestive heart failure) (HCC) 05/05/2023   Alcohol abuse 05/05/2023   Alcoholic cirrhosis (HCC) 04/12/2023   Esophageal varices with bleeding in diseases classified elsewhere (HCC) 04/09/2023   Hepatic encephalopathy (HCC) 04/09/2023   Secondary esophageal varices with bleeding (HCC) 04/08/2023   Upper GI bleeding 04/07/2023   Leg edema 03/01/2023   GI bleeding 02/22/2023   HLD (hyperlipidemia) 02/22/2023   Adrenal insufficiency (HCC) 02/22/2023   Alcoholic liver disease (HCC) 02/22/2023   Upper GI bleed 02/22/2023  Myocardial injury 01/29/2023   Adrenal insufficiency (Addison's disease) (HCC) 01/28/2023   Reactive thrombocytosis 01/24/2023   Hypophosphatemia 01/22/2023   Acute hypoxic respiratory failure (HCC) 01/20/2023   Orthostatic hypotension 01/17/2023   Anasarca 01/17/2023   Hemorrhagic shock (HCC) 01/17/2023   Obesity 01/17/2023   Cocaine dependence with cocaine-induced disorder (HCC) 01/17/2023   Rectal bleeding 01/17/2023   Peripheral edema 01/17/2023   History of benzodiazepine use 01/17/2023   Chest pain 11/19/2022   Acute on chronic heart  failure with preserved ejection fraction (HFpEF) (HCC) 10/29/2022   Acute respiratory distress 10/29/2022   Elevated rheumatoid factor 10/28/2022   Hyperkalemia 10/25/2022   Hemorrhoids, internal, with bleeding 10/25/2022   Mesenteric artery stenosis (HCC) 10/23/2022   Iron deficiency anemia 10/23/2022   Hematochezia    Abnormal CT scan, colon    Colitis 09/30/2022   Prolonged QT interval 09/30/2022   Alcohol use disorder 08/19/2022   GI bleed 05/28/2022   Non-intractable vomiting    Epigastric pain    Polyp of ascending colon    Anemia due to GI blood loss 08/07/2021   Acute gastritis    Hyperbilirubinemia    Elevated troponin    Notalgia    History of ST elevation myocardial infarction (STEMI) 12/19/2020   Alcohol dependence (HCC) 12/19/2020   Depression with anxiety 10/05/2020   Esophageal reflux 10/05/2020   Headache 10/05/2020   Hyperlipidemia 10/05/2020   Hyponatremia 06/04/2020   Hypomagnesemia 06/04/2020   Chronic systolic CHF (congestive heart failure) (HCC) 06/04/2020   Leukocytosis 06/04/2020   Abdominal pain 06/04/2020   Hypokalemia 06/04/2020   Ischemic cardiomyopathy    Polysubstance abuse (HCC)    Demand ischemia (HCC) 07/12/2019   Abdominal pain, RUQ 05/25/2018   Chest pain, mid sternal 11/17/2012   Cocaine abuse (HCC)    Marijuana abuse    Tobacco abuse 02/26/2011   Coronary artery disease involving native coronary artery of native heart with angina pectoris (HCC) 02/26/2011   Other specified forms of chronic ischemic heart disease 02/26/2011     Past Surgical History   Past Surgical History:  Procedure Laterality Date   COLONOSCOPY  08/09/2021   Procedure: COLONOSCOPY;  Surgeon: Midge Minium, MD;  Location: [facility] ENDOSCOPY;  Service: Endoscopy;;   CORONARY ANGIOPLASTY WITH STENT PLACEMENT     ESOPHAGEAL BANDING  04/08/2023   Procedure: ESOPHAGEAL BANDING;  Surgeon: Tressia Danas, MD;  Location: [facility] ENDOSCOPY;  Service: Gastroenterology;;    ESOPHAGOGASTRODUODENOSCOPY N/A 05/30/2022   Procedure: ESOPHAGOGASTRODUODENOSCOPY (EGD);  Surgeon: Toney Reil, MD;  Location: [facility] ENDOSCOPY;  Service: Gastroenterology;  Laterality: N/A;   ESOPHAGOGASTRODUODENOSCOPY (EGD) WITH PROPOFOL N/A 03/01/2021   Procedure: ESOPHAGOGASTRODUODENOSCOPY (EGD) WITH PROPOFOL;  Surgeon: Toney Reil, MD;  Location: [facility] SURGERY CN[facility];  Service: Endoscopy;  Laterality: N/A;   ESOPHAGOGASTRODUODENOSCOPY (EGD) WITH PROPOFOL N/A 08/09/2021   Procedure: ESOPHAGOGASTRODUODENOSCOPY (EGD) WITH PROPOFOL;  Surgeon: Midge Minium, MD;  Location: [facility] ENDOSCOPY;  Service: Endoscopy;  Laterality: N/A;   ESOPHAGOGASTRODUODENOSCOPY (EGD) WITH PROPOFOL N/A 03/01/2023   Procedure: ESOPHAGOGASTRODUODENOSCOPY (EGD) WITH PROPOFOL;  Surgeon: Jaynie Collins, DO;  Location: [facility] ENDOSCOPY;  Service: Gastroenterology;  Laterality: N/A;   ESOPHAGOGASTRODUODENOSCOPY (EGD) WITH PROPOFOL N/A 04/08/2023   Procedure: ESOPHAGOGASTRODUODENOSCOPY (EGD) WITH PROPOFOL;  Surgeon: Tressia Danas, MD;  Location: [facility] ENDOSCOPY;  Service: Gastroenterology;  Laterality: N/A;   ESOPHAGOGASTRODUODENOSCOPY (EGD) WITH PROPOFOL N/A 08/09/2023   Procedure: ESOPHAGOGASTRODUODENOSCOPY (EGD) WITH PROPOFOL;  Surgeon: Regis Bill, MD;  Location: [facility] ENDOSCOPY;  Service: Endoscopy;  Laterality: N/A;   ESOPHAGOGASTRODUODENOSCOPY (EGD) WITH  PROPOFOL N/A 08/19/2023   Procedure: ESOPHAGOGASTRODUODENOSCOPY (EGD) WITH PROPOFOL;  Surgeon: Regis Bill, MD;  Location: [facility] ENDOSCOPY;  Service: Endoscopy;  Laterality: N/A;   EVALUATION UNDER ANESTHESIA WITH HEMORRHOIDECTOMY N/A 10/31/2022   Procedure: EXAM UNDER ANESTHESIA WITH SUTURE LIGATION OF TWO BLEEDING PEDICLES;  Surgeon: Henrene Dodge, MD;  Location: [facility] ORS;  Service: General;  Laterality: N/A;   FLEXIBLE SIGMOIDOSCOPY N/A 10/02/2022   Procedure: FLEXIBLE SIGMOIDOSCOPY;  Surgeon: Meryl Dare, MD;  Location: [facility] ENDOSCOPY;   Service: Gastroenterology;  Laterality: N/A;   HEMOSTASIS CONTROL  08/09/2023   Procedure: HEMOSTASIS CONTROL;  Surgeon: Regis Bill, MD;  Location: [facility] ENDOSCOPY;  Service: Endoscopy;;   IR TIPS  04/11/2023   IR US GUIDE VASC ACCESS RIGHT  04/11/2023   IR US GUIDE VASC ACCESS RIGHT  04/11/2023   IR US GUIDE VASC ACCESS RIGHT  04/11/2023   LEFT HEART CATH AND CORONARY ANGIOGRAPHY N/A 11/04/2022   Procedure: LEFT HEART CATH AND CORONARY ANGIOGRAPHY;  Surgeon: Iran Ouch, MD;  Location: [facility] INVASIVE CV LAB;  Service: Cardiovascular;  Laterality: N/A;   RADIOLOGY WITH ANESTHESIA N/A 04/11/2023   Procedure: IR WITH ANESTHESIA;  Surgeon: Oley Balm, MD;  Location: [facility] OR;  Service: Radiology;  Laterality: N/A;   VISCERAL ANGIOGRAPHY N/A 10/24/2022   Procedure: VISCERAL ANGIOGRAPHY;  Surgeon: Annice Needy, MD;  Location: [facility] INVASIVE CV LAB;  Service: Cardiovascular;  Laterality: N/A;   VISCERAL ARTERY INTERVENTION N/A 05/09/2023   Procedure: VISCERAL ARTERY INTERVENTION;  Surgeon: Renford Dills, MD;  Location: [facility] INVASIVE CV LAB;  Service: Cardiovascular;  Laterality: N/A;     Home Medications   Prior to Admission medications   Medication Sig Start Date End Date Taking? Authorizing Provider  chlorpheniramine-HYDROcodone (TUSSIONEX) 10-8 MG/5ML Take 5 mLs by mouth every 12 (twelve) hours as needed for cough. 01/07/24  Yes Irean Hong, MD  magic mouthwash SOLN 12mL Anbesol 30mL Benadryl 30mL Mylanta  5mL swish, gargle & spit q8hr prn throat discomfort 01/07/24  Yes Irean Hong, MD  albuterol (VENTOLIN HFA) 108 (90 Base) MCG/ACT inhaler Inhale 2 puffs into the lungs every 6 (six) hours as needed for wheezing or shortness of breath. 10/15/23   Chesley Noon, MD  alum & mag hydroxide-simeth (MAALOX/MYLANTA) 200-200-20 MG/5ML suspension Take 30 mLs by mouth every 6 (six) hours as needed for indigestion or heartburn. 12/18/23   Marcelino Duster, MD  docusate sodium  (COLACE) 100 MG capsule Take 1 capsule (100 mg total) by mouth 2 (two) times daily as needed (if less than 3 BM"s in past 24 hours). 12/18/23 01/17/24  Marcelino Duster, MD  feeding supplement (ENSURE ENLIVE / ENSURE PLUS) LIQD Take 237 mLs by mouth 2 (two) times daily between meals. 08/25/23   Esaw Grandchild A, DO  fluticasone furoate-vilanterol (BREO ELLIPTA) 100-25 MCG/ACT AEPB Inhale 1 puff into the lungs daily. Patient not taking: Reported on 11/23/2023 10/19/23   Sunnie Nielsen, DO  furosemide (LASIX) 40 MG tablet Take 1 tablet (40 mg total) by mouth daily. Increase to 1 tablet (40 mg total) by mouth TWICE daily (total daily dose 80 mg) as needed for up to 3 days for increased leg swelling, shortness of breath, weight gain 5+ lbs over 1-2 days. Seek medical care if these symptoms are not improving with increased dose. 10/18/23 12/17/23  Sunnie Nielsen, DO  gabapentin (NEURONTIN) 100 MG capsule Take 1 capsule (100 mg total) by mouth 2 (two) times daily. Patient taking differently: Take 300 mg by  mouth 2 (two) times daily. 10/18/23 12/12/23  Sunnie Nielsen, DO  ipratropium-albuterol (DUONEB) 0.5-2.5 (3) MG/3ML SOLN Take 3 mLs by nebulization every 2 (two) hours as needed (wheeze, SOB). 10/18/23   Sunnie Nielsen, DO  isosorbide mononitrate (IMDUR) 30 MG 24 hr tablet Take 0.5 tablets (15 mg total) by mouth daily. 11/24/23   Wouk, Wilfred Curtis, MD  midodrine (PROAMATINE) 5 MG tablet Take 1 tablet (5 mg total) by mouth 3 (three) times daily with meals. Patient not taking: Reported on 11/23/2023 10/18/23   Sunnie Nielsen, DO  Nebulizers (COMPRESSOR/NEBULIZER) MISC Nebulizer + mask/hoses per patient preference and insurance coverage, use as directed 10/18/23   Sunnie Nielsen, DO  ondansetron (ZOFRAN-ODT) 4 MG disintegrating tablet Take 1 tablet (4 mg total) by mouth every 8 (eight) hours as needed for nausea or vomiting. 01/02/24   Jene Every, MD  pantoprazole (PROTONIX) 40 MG tablet Take  1 tablet (40 mg total) by mouth 2 (two) times daily. 10/18/23   Sunnie Nielsen, DO  polyethylene glycol powder (GLYCOLAX/MIRALAX) 17 GM/SCOOP powder Take 17 g by mouth daily as needed (if less than 3 BM"s in past 24 hours). 08/25/23   Esaw Grandchild A, DO  potassium chloride SA (KLOR-CON M) 20 MEQ tablet Take 1 tablet (20 mEq total) by mouth daily. 10/18/23   Sunnie Nielsen, DO  Respiratory Therapy Supplies (NEBULIZER/TUBING/MOUTHPIECE) KIT As directed 10/18/23   Sunnie Nielsen, DO  rosuvastatin (CRESTOR) 40 MG tablet TAKE 1 TABLET BY MOUTH EVERY DAY 10/20/23   Medina-Vargas, Monina C, NP     Allergies  Penicillins   Family History   Family History  Problem Relation Age of Onset   Coronary artery disease Mother        alive   Other Other        2 sisters alive and well   Alcohol abuse Father        died @ 21   Other Paternal Grandmother        PACER     Physical Exam  Triage Vital Signs: ED Triage Vitals  Encounter Vitals Group     BP 01/07/24 0033 134/84     Systolic BP Percentile --      Diastolic BP Percentile --      Pulse Rate 01/07/24 0033 98     Resp 01/07/24 0033 18     Temp 01/07/24 0033 97.9 F (36.6 C)     Temp Source 01/07/24 0033 Oral     SpO2 01/07/24 0033 95 %     Weight 01/07/24 0031 183 lb (83 kg)     Height 01/07/24 0031 5\' 5"  (1.651 m)     Head Circumference --      Peak Flow --      Pain Score 01/07/24 0031 10     Pain Loc --      Pain Education --      Exclude from Growth Chart --     Updated Vital Signs: BP (!) 115/59   Pulse 95   Temp 97.9 F (36.6 C) (Oral)   Resp 17   Ht 5\' 5"  (1.651 m)   Wt 83 kg   SpO2 100%   BMI 30.45 kg/m    General: Awake, no distress.  CV:  RRR.  Good peripheral perfusion.  Resp:  Normal effort.  Diminished, otherwise CTAB. Abd:  No distention.  Other:  Posterior oropharynx mildly erythematous without tonsillar swelling, exudates or peritonsillar abscess.  There is no hoarse or muffled voice.  There is no drooling.  No cervical lymphadenopathy.   ED Results / Procedures / Treatments  Labs (all labs ordered are listed, but only abnormal results are displayed) Labs Reviewed  RESP PANEL BY RT-PCR (RSV, FLU A&B, COVID)  [id number]     EKG  ED ECG REPORT I, Gigi Onstad J, the attending physician, personally viewed and interpreted this ECG.   Date: 01/07/2024  EKG Time: 0231  Rate: 90  Rhythm: normal sinus rhythm  Axis: Normal  Intervals:none  ST&T Change: Nonspecific No significant change compared to EKG dated 01/05/2024   RADIOLOGY I have independently visualized and interpreted patient's imaging study as well as noted the radiology interpretation:  Chest x-ray: No acute cardiopulmonary process  Official radiology report(s): DG Chest 1 View Result Date: 01/07/2024 CLINICAL DATA:  Cough and generalized body aches EXAM: CHEST  1 VIEW COMPARISON:  01/03/2024 FINDINGS: Stable cardiomediastinal silhouette. No focal consolidation, pleural effusion, or pneumothorax. No displaced rib fractures. IMPRESSION: No active disease. Electronically Signed   By: Minerva Fester M.D.   On: 01/07/2024 01:07     PROCEDURES:  Critical Care performed: No  Procedures   MEDICATIONS ORDERED IN ED: Medications  acetaminophen (TYLENOL) tablet 650 mg (650 mg Oral Given 01/07/24 0225)  magic mouthwash (10 mLs Oral Given 01/07/24 0225)  chlorpheniramine-HYDROcodone (TUSSIONEX) 10-8 MG/5ML suspension 5 mL (5 mLs Oral Given 01/07/24 0225)  ipratropium-albuterol (DUONEB) 0.5-2.5 (3) MG/3ML nebulizer solution 3 mL (3 mLs Nebulization Given 01/07/24 0215)  dexamethasone (DECADRON) injection 10 mg (10 mg Intramuscular Given 01/07/24 0216)     IMPRESSION / MDM / ASSESSMENT AND PLAN / ED COURSE  I reviewed the triage vital signs and the nursing notes.                             53 year old male presenting with cold symptoms. Respiratory panel and cxr negative. Will obtain EKG given his cardiac  history, Tylenol, Tussionex for cough, Magic Mouthwash for sore throat.  Patient's presentation is most consistent with acute, uncomplicated illness.  1324 Patient feeling better. Will discharge home with as needed prescriptions for Tussionex and Magic Mouthwash. Strict return precautions given. Patient verbalizes understanding and agrees with plan of care.  FINAL CLINICAL IMPRESSION(S) / ED DIAGNOSES   Final diagnoses:  Viral illness  Sore throat     Rx / DC Orders   ED Discharge Orders          Ordered    magic mouthwash SOLN        01/07/24 0257    chlorpheniramine-HYDROcodone (TUSSIONEX) 10-8 MG/5ML  Every 12 hours PRN        01/07/24 0257             Note:  This document was prepared using Dragon voice recognition software and may include unintentional dictation errors.   Irean Hong, MD 01/07/24 [phone]

## 2024-01-07 NOTE — Discharge Instructions (Signed)
You may take medicines for sore throat and cough as needed. Return to the ER for worsening symptoms, persistent vomiting, difficulty breathing or other concerns.

## 2024-01-08 ENCOUNTER — Other Ambulatory Visit: Payer: Self-pay

## 2024-01-08 ENCOUNTER — Inpatient Hospital Stay
Admission: EM | Admit: 2024-01-08 | Discharge: 2024-01-14 | DRG: 917 | Disposition: A | Discharge status: Home Health | Payer: Medicare HMO | Attending: Internal Medicine | Admitting: Internal Medicine

## 2024-01-08 DIAGNOSIS — F10920 Alcohol use, unspecified with intoxication, uncomplicated: Secondary | ICD-10-CM

## 2024-01-08 DIAGNOSIS — Z1152 Encounter for screening for COVID-19: Secondary | ICD-10-CM | POA: Diagnosis not present

## 2024-01-08 DIAGNOSIS — J441 Chronic obstructive pulmonary disease with (acute) exacerbation: Secondary | ICD-10-CM | POA: Diagnosis present

## 2024-01-08 DIAGNOSIS — F1410 Cocaine abuse, uncomplicated: Secondary | ICD-10-CM | POA: Diagnosis present

## 2024-01-08 DIAGNOSIS — I214 Non-ST elevation (NSTEMI) myocardial infarction: Secondary | ICD-10-CM | POA: Diagnosis present

## 2024-01-08 DIAGNOSIS — Y908 Blood alcohol level of 240 mg/100 ml or more: Secondary | ICD-10-CM | POA: Diagnosis present

## 2024-01-08 DIAGNOSIS — E669 Obesity, unspecified: Secondary | ICD-10-CM | POA: Diagnosis present

## 2024-01-08 DIAGNOSIS — F1010 Alcohol abuse, uncomplicated: Secondary | ICD-10-CM | POA: Diagnosis not present

## 2024-01-08 DIAGNOSIS — K7030 Alcoholic cirrhosis of liver without ascites: Secondary | ICD-10-CM | POA: Diagnosis present

## 2024-01-08 DIAGNOSIS — I959 Hypotension, unspecified: Secondary | ICD-10-CM | POA: Diagnosis present

## 2024-01-08 DIAGNOSIS — E8729 Other acidosis: Secondary | ICD-10-CM | POA: Diagnosis present

## 2024-01-08 DIAGNOSIS — E876 Hypokalemia: Secondary | ICD-10-CM | POA: Diagnosis present

## 2024-01-08 DIAGNOSIS — J101 Influenza due to other identified influenza virus with other respiratory manifestations: Secondary | ICD-10-CM | POA: Diagnosis not present

## 2024-01-08 DIAGNOSIS — E785 Hyperlipidemia, unspecified: Secondary | ICD-10-CM | POA: Diagnosis present

## 2024-01-08 DIAGNOSIS — R0602 Shortness of breath: Secondary | ICD-10-CM | POA: Diagnosis not present

## 2024-01-08 DIAGNOSIS — D696 Thrombocytopenia, unspecified: Secondary | ICD-10-CM | POA: Diagnosis present

## 2024-01-08 DIAGNOSIS — D509 Iron deficiency anemia, unspecified: Secondary | ICD-10-CM | POA: Diagnosis present

## 2024-01-08 DIAGNOSIS — R079 Chest pain, unspecified: Principal | ICD-10-CM

## 2024-01-08 DIAGNOSIS — I5032 Chronic diastolic (congestive) heart failure: Secondary | ICD-10-CM | POA: Diagnosis present

## 2024-01-08 DIAGNOSIS — J440 Chronic obstructive pulmonary disease with acute lower respiratory infection: Secondary | ICD-10-CM | POA: Diagnosis present

## 2024-01-08 DIAGNOSIS — E871 Hypo-osmolality and hyponatremia: Secondary | ICD-10-CM | POA: Diagnosis present

## 2024-01-08 DIAGNOSIS — I9589 Other hypotension: Secondary | ICD-10-CM | POA: Diagnosis not present

## 2024-01-08 DIAGNOSIS — E272 Addisonian crisis: Secondary | ICD-10-CM | POA: Diagnosis present

## 2024-01-08 DIAGNOSIS — I110 Hypertensive heart disease with heart failure: Secondary | ICD-10-CM | POA: Diagnosis present

## 2024-01-08 DIAGNOSIS — T405X1A Poisoning by cocaine, accidental (unintentional), initial encounter: Secondary | ICD-10-CM | POA: Diagnosis present

## 2024-01-08 DIAGNOSIS — I272 Pulmonary hypertension, unspecified: Secondary | ICD-10-CM | POA: Diagnosis present

## 2024-01-08 DIAGNOSIS — Z6831 Body mass index (BMI) 31.0-31.9, adult: Secondary | ICD-10-CM | POA: Diagnosis not present

## 2024-01-08 DIAGNOSIS — I2510 Atherosclerotic heart disease of native coronary artery without angina pectoris: Secondary | ICD-10-CM | POA: Diagnosis present

## 2024-01-08 DIAGNOSIS — Y92009 Unspecified place in unspecified non-institutional (private) residence as the place of occurrence of the external cause: Secondary | ICD-10-CM | POA: Diagnosis not present

## 2024-01-08 DIAGNOSIS — I255 Ischemic cardiomyopathy: Secondary | ICD-10-CM | POA: Diagnosis present

## 2024-01-08 DIAGNOSIS — K7010 Alcoholic hepatitis without ascites: Secondary | ICD-10-CM | POA: Diagnosis present

## 2024-01-08 LAB — HEPATIC FUNCTION PANEL
ALT: 18 U/L (ref 0–44)
AST: 71 U/L — ABNORMAL HIGH (ref 15–41)
Albumin: 3.2 g/dL — ABNORMAL LOW (ref 3.5–5.0)
Alkaline Phosphatase: 163 U/L — ABNORMAL HIGH (ref 38–126)
Bilirubin, Direct: 1.2 mg/dL — ABNORMAL HIGH (ref 0.0–0.2)
Indirect Bilirubin: 2.3 mg/dL — ABNORMAL HIGH (ref 0.3–0.9)
Total Bilirubin: 3.5 mg/dL — ABNORMAL HIGH (ref 0.0–1.2)
Total Protein: 7.7 g/dL (ref 6.5–8.1)

## 2024-01-08 LAB — URINALYSIS, ROUTINE W REFLEX MICROSCOPIC
Bilirubin Urine: NEGATIVE
Glucose, UA: 50 mg/dL — AB
Hgb urine dipstick: NEGATIVE
Ketones, ur: NEGATIVE mg/dL
Leukocytes,Ua: NEGATIVE
Nitrite: NEGATIVE
Protein, ur: NEGATIVE mg/dL
Specific Gravity, Urine: 1.014 (ref 1.005–1.030)
pH: 7 (ref 5.0–8.0)

## 2024-01-08 LAB — BLOOD GAS, VENOUS
Acid-Base Excess: 1.6 mmol/L (ref 0.0–2.0)
Bicarbonate: 24.6 mmol/L (ref 20.0–28.0)
O2 Saturation: 93.7 %
Patient temperature: 37
pCO2, Ven: 33 mm[Hg] — ABNORMAL LOW (ref 44–60)
pH, Ven: 7.48 — ABNORMAL HIGH (ref 7.25–7.43)
pO2, Ven: 67 mm[Hg] — ABNORMAL HIGH (ref 32–45)

## 2024-01-08 LAB — MAGNESIUM: Magnesium: 1.7 mg/dL (ref 1.7–2.4)

## 2024-01-08 LAB — TROPONIN I (HIGH SENSITIVITY)
Troponin I (High Sensitivity): 139 ng/L (ref <18)
Troponin I (High Sensitivity): 293 ng/L (ref <18)
Troponin I (High Sensitivity): 372 ng/L (ref <18)

## 2024-01-08 LAB — HEMOGLOBIN AND HEMATOCRIT, BLOOD
HCT: 23.5 % — ABNORMAL LOW (ref 39.0–52.0)
Hemoglobin: 7.9 g/dL — ABNORMAL LOW (ref 13.0–17.0)

## 2024-01-08 LAB — PROTIME-INR
INR: 1.5 — ABNORMAL HIGH (ref 0.8–1.2)
Prothrombin Time: 18.3 s — ABNORMAL HIGH (ref 11.4–15.2)

## 2024-01-08 LAB — HEPARIN LEVEL (UNFRACTIONATED)
Heparin Unfractionated: 0.39 [IU]/mL (ref 0.30–0.70)
Heparin Unfractionated: 0.28 [IU]/mL — ABNORMAL LOW (ref 0.30–0.70)

## 2024-01-08 LAB — APTT: aPTT: 37 s — ABNORMAL HIGH (ref 24–36)

## 2024-01-08 LAB — ETHANOL: Alcohol, Ethyl (B): 243 mg/dL — ABNORMAL HIGH (ref <10)

## 2024-01-08 LAB — CK: Total CK: 79 U/L (ref 49–397)

## 2024-01-08 MED ORDER — POLYETHYLENE GLYCOL 3350 17 GM/SCOOP PO POWD: 17.0000 g | Freq: Every day | ORAL | Status: DC | PRN

## 2024-01-08 MED ORDER — HEPARIN BOLUS VIA INFUSION
1200.0000 [IU] | Freq: Once | INTRAVENOUS | Status: AC
Start: 2024-01-08 — End: 2024-01-08
  Administered 2024-01-08: 1200 [IU] via INTRAVENOUS
  Filled 2024-01-08: qty 1200

## 2024-01-08 MED ORDER — IPRATROPIUM-ALBUTEROL 0.5-2.5 (3) MG/3ML IN SOLN
3.0000 mL | Freq: Once | RESPIRATORY_TRACT | Status: AC
  Administered 2024-01-08: 3 mL via RESPIRATORY_TRACT
  Filled 2024-01-08: qty 3

## 2024-01-08 MED ORDER — THIAMINE HCL 100 MG PO TABS
100.0000 mg | ORAL_TABLET | Freq: Every day | ORAL | Status: DC
  Administered 2024-01-08 – 2024-01-14 (×7): 100 mg via ORAL
  Filled 2024-01-08 (×14): qty 1

## 2024-01-08 MED ORDER — ENSURE ENLIVE PO LIQD
237.0000 mL | Freq: Two times a day (BID) | ORAL | Status: DC
  Administered 2024-01-08 – 2024-01-14 (×9): 237 mL via ORAL

## 2024-01-08 MED ORDER — SODIUM CHLORIDE 0.9 % IV BOLUS
1000.0000 mL | Freq: Once | INTRAVENOUS | Status: AC
  Administered 2024-01-08: 1000 mL via INTRAVENOUS

## 2024-01-08 MED ORDER — LORAZEPAM 2 MG/ML IJ SOLN
2.0000 mg | Freq: Once | INTRAMUSCULAR | Status: AC
  Administered 2024-01-08: 2 mg via INTRAVENOUS
  Filled 2024-01-08: qty 1

## 2024-01-08 MED ORDER — POTASSIUM CHLORIDE CRYS ER 20 MEQ PO TBCR: 20.0000 meq | EXTENDED_RELEASE_TABLET | Freq: Every day | ORAL | Status: DC

## 2024-01-08 MED ORDER — HEPARIN BOLUS VIA INFUSION
4000.0000 [IU] | Freq: Once | INTRAVENOUS | Status: AC
  Administered 2024-01-08: 4000 [IU] via INTRAVENOUS
  Filled 2024-01-08: qty 4000

## 2024-01-08 MED ORDER — BUDESONIDE 0.25 MG/2ML IN SUSP
0.2500 mg | Freq: Two times a day (BID) | RESPIRATORY_TRACT | Status: DC
  Administered 2024-01-08 – 2024-01-14 (×12): 0.25 mg via RESPIRATORY_TRACT
  Filled 2024-01-08 (×12): qty 2

## 2024-01-08 MED ORDER — LORAZEPAM 2 MG/ML IJ SOLN
0.0000 mg | Freq: Four times a day (QID) | INTRAMUSCULAR | Status: AC
  Administered 2024-01-08 (×3): 1 mg via INTRAVENOUS
  Administered 2024-01-08: 2 mg via INTRAVENOUS
  Filled 2024-01-08 (×4): qty 1

## 2024-01-08 MED ORDER — IPRATROPIUM-ALBUTEROL 0.5-2.5 (3) MG/3ML IN SOLN: 3.0000 mL | RESPIRATORY_TRACT | Status: DC | PRN

## 2024-01-08 MED ORDER — GABAPENTIN 300 MG PO CAPS: 300.0000 mg | ORAL_CAPSULE | Freq: Two times a day (BID) | ORAL | Status: DC

## 2024-01-08 MED ORDER — MORPHINE SULFATE (PF) 2 MG/ML IV SOLN
2.0000 mg | INTRAVENOUS | Status: DC | PRN
  Administered 2024-01-08 – 2024-01-09 (×8): 2 mg via INTRAVENOUS
  Filled 2024-01-08 (×8): qty 1

## 2024-01-08 MED ORDER — CARVEDILOL 3.125 MG PO TABS
3.1250 mg | ORAL_TABLET | Freq: Two times a day (BID) | ORAL | Status: DC
Start: 2024-01-08 — End: 2024-01-10
  Administered 2024-01-08 – 2024-01-10 (×4): 3.125 mg via ORAL
  Filled 2024-01-08 (×4): qty 1

## 2024-01-08 MED ORDER — PANTOPRAZOLE SODIUM 40 MG PO TBEC
40.0000 mg | DELAYED_RELEASE_TABLET | Freq: Two times a day (BID) | ORAL | Status: DC
  Administered 2024-01-08 – 2024-01-14 (×13): 40 mg via ORAL
  Filled 2024-01-08 (×13): qty 1

## 2024-01-08 MED ORDER — HEPARIN (PORCINE) 25000 UT/250ML-% IV SOLN: 14.0000 [IU]/kg/h | INTRAVENOUS | Status: DC

## 2024-01-08 MED ORDER — ONDANSETRON 4 MG PO TBDP: 4.0000 mg | ORAL_TABLET | Freq: Three times a day (TID) | ORAL | Status: DC | PRN

## 2024-01-08 MED ORDER — HEPARIN SODIUM (PORCINE) 5000 UNIT/ML IJ SOLN
4000.0000 [IU] | Freq: Once | INTRAMUSCULAR | Status: DC
Start: 2024-01-08 — End: 2024-01-08

## 2024-01-08 MED ORDER — ISOSORBIDE MONONITRATE ER 30 MG PO TB24
15.0000 mg | ORAL_TABLET | Freq: Every day | ORAL | Status: DC
  Administered 2024-01-08 – 2024-01-10 (×3): 15 mg via ORAL
  Filled 2024-01-08 (×3): qty 1

## 2024-01-08 MED ORDER — DOCUSATE SODIUM 100 MG PO CAPS
100.0000 mg | ORAL_CAPSULE | Freq: Two times a day (BID) | ORAL | Status: DC | PRN
  Administered 2024-01-12: 100 mg via ORAL
  Filled 2024-01-08: qty 1

## 2024-01-08 MED ORDER — LORAZEPAM 2 MG PO TABS
2.0000 mg | ORAL_TABLET | Freq: Three times a day (TID) | ORAL | Status: DC
  Administered 2024-01-08 – 2024-01-09 (×4): 2 mg via ORAL
  Filled 2024-01-08 (×4): qty 1

## 2024-01-08 MED ORDER — ROSUVASTATIN CALCIUM 10 MG PO TABS
40.0000 mg | ORAL_TABLET | Freq: Every day | ORAL | Status: DC
Start: 2024-01-08 — End: 2024-01-14
  Administered 2024-01-08 – 2024-01-14 (×7): 40 mg via ORAL
  Filled 2024-01-08 (×2): qty 4
  Filled 2024-01-08: qty 2
  Filled 2024-01-08 (×4): qty 4

## 2024-01-08 MED ORDER — POTASSIUM CHLORIDE CRYS ER 20 MEQ PO TBCR
40.0000 meq | EXTENDED_RELEASE_TABLET | Freq: Once | ORAL | Status: AC
  Administered 2024-01-08: 40 meq via ORAL
  Filled 2024-01-08: qty 2

## 2024-01-08 MED ORDER — METHYLPREDNISOLONE SODIUM SUCC 125 MG IJ SOLR
125.0000 mg | Freq: Once | INTRAMUSCULAR | Status: AC
  Administered 2024-01-08: 125 mg via INTRAVENOUS
  Filled 2024-01-08: qty 2

## 2024-01-08 MED ORDER — METOCLOPRAMIDE HCL 5 MG/ML IJ SOLN
5.0000 mg | Freq: Four times a day (QID) | INTRAMUSCULAR | Status: DC | PRN
  Administered 2024-01-08: 5 mg via INTRAVENOUS
  Filled 2024-01-08: qty 2

## 2024-01-08 MED ORDER — PREDNISONE 20 MG PO TABS
40.0000 mg | ORAL_TABLET | Freq: Every day | ORAL | Status: DC
  Administered 2024-01-08 – 2024-01-10 (×3): 40 mg via ORAL
  Filled 2024-01-08 (×3): qty 2

## 2024-01-08 MED ORDER — GABAPENTIN 100 MG PO CAPS
100.0000 mg | ORAL_CAPSULE | Freq: Two times a day (BID) | ORAL | Status: DC
  Administered 2024-01-08 – 2024-01-14 (×12): 100 mg via ORAL
  Filled 2024-01-08 (×12): qty 1

## 2024-01-08 MED ORDER — ASPIRIN 81 MG PO TBEC
81.0000 mg | DELAYED_RELEASE_TABLET | Freq: Every day | ORAL | Status: DC
  Administered 2024-01-08 – 2024-01-14 (×7): 81 mg via ORAL
  Filled 2024-01-08 (×7): qty 1

## 2024-01-08 MED ORDER — HEPARIN (PORCINE) 25000 UT/250ML-% IV SOLN
1200.0000 [IU]/h | INTRAVENOUS | Status: DC
  Administered 2024-01-08: 1200 [IU]/h via INTRAVENOUS
  Administered 2024-01-08: 1050 [IU]/h via INTRAVENOUS
  Administered 2024-01-09: 1200 [IU]/h via INTRAVENOUS
  Filled 2024-01-08 (×3): qty 250

## 2024-01-08 MED ORDER — POTASSIUM CHLORIDE 10 MEQ/100ML IV SOLN
10.0000 meq | Freq: Once | INTRAVENOUS | Status: DC
Start: 2024-01-08 — End: 2024-01-08

## 2024-01-08 NOTE — ED Notes (Signed)
ED Provider at bedside. 

## 2024-01-08 NOTE — Progress Notes (Signed)
   01/08/24 1553  Readmission Prevention Plan - Extreme Risk  Transportation Screening Complete  Medication Review Complete  PCP or Specialist appointment within 3-5 days of discharge Complete (Pt has cardiology. If apt is needed, Nurse secretary will schedule)  Social Work consult for recovery care planning/counseling (includes patient and caregiver) Complete  Palliative Care Screening Not Applicable  Skilled Nursing Facility Not Applicable   High Risk Re-admission Screening complete.

## 2024-01-08 NOTE — ED Provider Notes (Signed)
[facility] Provider Note    Event Date/Time   First MD Initiated Contact with Patient 01/08/24 [phone]     (approximate)   History   Chest Pain   HPI  Ethan Wells is a 53 y.o. male brought to the ED via EMS from home with a chief complaint of chest pain and shortness of breath.  Patient with a history of EtOH abuse, CAD, COPD, cocaine abuse who presents for his 6 visit this month.  Seen yesterday for viral illness/COPD exacerbation.  Reports continued chest pain, shortness of breath with wheezing.  Requesting nasal cannula oxygen.  Denies fever/chills, abdominal pain, nausea, vomiting or dizziness.     Past Medical History   Past Medical History:  Diagnosis Date   (HFpEF) heart failure with preserved ejection fraction (HCC)    Addison disease (HCC)    CAD (coronary artery disease)    a. 01/2011 Anterior STEMI/Cath/PCI: LM nl, LAD 100d (3.5x37mm Vision BMS placed), LCX 26m, RI 50, RCA min irregs, EF 40% w/ apical, inferoapical HK; b. 10/2022 NSTSEMI/Cath: LM nl, LAD 5p/m, RI 85, LCX 166m, OM1 mild dzs, OM3 100 fills via L->L collats from dLAD, RCA 80p, 176m, RPDA fills via collats from LAD. EF 45-50%-->Med Rx.   CAD, multiple vessel    Cardiac arrest - ventricular fibrillation    a. In setting of STEMI 01/2011   Chronic, continuous use of opioids    Cocaine abuse (HCC)    COPD (chronic obstructive pulmonary disease) (HCC)    Depression with anxiety    ETOH abuse    a. 6-12 beers/day   GERD (gastroesophageal reflux disease)    GI bleed    Hemorrhoids    Hyperlipidemia    Hypertension    Ischemic cardiomyopathy    a. 06/2011 Echo: EF 45-50%, No rwma; b. 10/2022 Echo: EF 55-60%, no rwma, nl RV fxn.   Liver cirrhosis, alcoholic (HCC)    Marijuana abuse    Migraines    Noncompliance with immunization regimen    Obesity (BMI 30.0-34.9)    Orthostatic hypertension    midodrine   Portal hypertension (HCC)    s/p TIPS   STEMI (ST elevation  myocardial infarction) (HCC) 2012   Tobacco abuse    a. 1/2 ppd x 26 yrs     Active Problem List   Patient Active Problem List   Diagnosis Date Noted   NSTEMI (non-ST elevated myocardial infarction) (HCC) 12/12/2023   Essential hypertension 12/12/2023   Precordial chest pain 11/23/2023   COPD (chronic obstructive pulmonary disease) (HCC) 11/23/2023   COPD exacerbation (HCC) 10/16/2023   Hepatic cirrhosis (HCC) 09/03/2023   Acute metabolic encephalopathy 08/11/2023   Acute blood loss anemia (ABLA) 08/08/2023   Elevated liver function tests 07/02/2023   Pancytopenia (HCC) 07/01/2023   Metabolic acidosis 07/01/2023   Liver mass 06/30/2023   Nausea vomiting and diarrhea 06/30/2023   Migraine 06/30/2023   Cholecystitis, acute 05/10/2023   UTI (urinary tract infection) 05/05/2023   Chronic diastolic CHF (congestive heart failure) (HCC) 05/05/2023   Alcohol abuse 05/05/2023   Alcoholic cirrhosis (HCC) 04/12/2023   Esophageal varices with bleeding in diseases classified elsewhere (HCC) 04/09/2023   Hepatic encephalopathy (HCC) 04/09/2023   Secondary esophageal varices with bleeding (HCC) 04/08/2023   Upper GI bleeding 04/07/2023   Leg edema 03/01/2023   GI bleeding 02/22/2023   HLD (hyperlipidemia) 02/22/2023   Adrenal insufficiency (HCC) 02/22/2023   Alcoholic liver disease (HCC) 02/22/2023   Upper GI bleed  02/22/2023   Myocardial injury 01/29/2023   Adrenal insufficiency (Addison's disease) (HCC) 01/28/2023   Reactive thrombocytosis 01/24/2023   Hypophosphatemia 01/22/2023   Acute hypoxic respiratory failure (HCC) 01/20/2023   Orthostatic hypotension 01/17/2023   Anasarca 01/17/2023   Hemorrhagic shock (HCC) 01/17/2023   Obesity 01/17/2023   Cocaine dependence with cocaine-induced disorder (HCC) 01/17/2023   Rectal bleeding 01/17/2023   Peripheral edema 01/17/2023   History of benzodiazepine use 01/17/2023   Chest pain 11/19/2022   Acute on chronic heart failure with  preserved ejection fraction (HFpEF) (HCC) 10/29/2022   Acute respiratory distress 10/29/2022   Elevated rheumatoid factor 10/28/2022   Hyperkalemia 10/25/2022   Hemorrhoids, internal, with bleeding 10/25/2022   Mesenteric artery stenosis (HCC) 10/23/2022   Iron deficiency anemia 10/23/2022   Hematochezia    Abnormal CT scan, colon    Colitis 09/30/2022   Prolonged QT interval 09/30/2022   Alcohol use disorder 08/19/2022   GI bleed 05/28/2022   Non-intractable vomiting    Epigastric pain    Polyp of ascending colon    Anemia due to GI blood loss 08/07/2021   Acute gastritis    Hyperbilirubinemia    Elevated troponin    Notalgia    History of ST elevation myocardial infarction (STEMI) 12/19/2020   Alcohol dependence (HCC) 12/19/2020   Depression with anxiety 10/05/2020   Esophageal reflux 10/05/2020   Headache 10/05/2020   Hyperlipidemia 10/05/2020   Hyponatremia 06/04/2020   Hypomagnesemia 06/04/2020   Chronic systolic CHF (congestive heart failure) (HCC) 06/04/2020   Leukocytosis 06/04/2020   Abdominal pain 06/04/2020   Hypokalemia 06/04/2020   Ischemic cardiomyopathy    Polysubstance abuse (HCC)    Demand ischemia (HCC) 07/12/2019   Abdominal pain, RUQ 05/25/2018   Chest pain, mid sternal 11/17/2012   Cocaine abuse (HCC)    Marijuana abuse    Tobacco abuse 02/26/2011   Coronary artery disease involving native coronary artery of native heart with angina pectoris (HCC) 02/26/2011   Other specified forms of chronic ischemic heart disease 02/26/2011     Past Surgical History   Past Surgical History:  Procedure Laterality Date   COLONOSCOPY  08/09/2021   Procedure: COLONOSCOPY;  Surgeon: Midge Minium, MD;  Location: [facility] ENDOSCOPY;  Service: Endoscopy;;   CORONARY ANGIOPLASTY WITH STENT PLACEMENT     ESOPHAGEAL BANDING  04/08/2023   Procedure: ESOPHAGEAL BANDING;  Surgeon: Tressia Danas, MD;  Location: [facility] ENDOSCOPY;  Service: Gastroenterology;;    ESOPHAGOGASTRODUODENOSCOPY N/A 05/30/2022   Procedure: ESOPHAGOGASTRODUODENOSCOPY (EGD);  Surgeon: Toney Reil, MD;  Location: [facility] ENDOSCOPY;  Service: Gastroenterology;  Laterality: N/A;   ESOPHAGOGASTRODUODENOSCOPY (EGD) WITH PROPOFOL N/A 03/01/2021   Procedure: ESOPHAGOGASTRODUODENOSCOPY (EGD) WITH PROPOFOL;  Surgeon: Toney Reil, MD;  Location: [facility];  Service: Endoscopy;  Laterality: N/A;   ESOPHAGOGASTRODUODENOSCOPY (EGD) WITH PROPOFOL N/A 08/09/2021   Procedure: ESOPHAGOGASTRODUODENOSCOPY (EGD) WITH PROPOFOL;  Surgeon: Midge Minium, MD;  Location: [facility] ENDOSCOPY;  Service: Endoscopy;  Laterality: N/A;   ESOPHAGOGASTRODUODENOSCOPY (EGD) WITH PROPOFOL N/A 03/01/2023   Procedure: ESOPHAGOGASTRODUODENOSCOPY (EGD) WITH PROPOFOL;  Surgeon: Jaynie Collins, DO;  Location: [facility] ENDOSCOPY;  Service: Gastroenterology;  Laterality: N/A;   ESOPHAGOGASTRODUODENOSCOPY (EGD) WITH PROPOFOL N/A 04/08/2023   Procedure: ESOPHAGOGASTRODUODENOSCOPY (EGD) WITH PROPOFOL;  Surgeon: Tressia Danas, MD;  Location: [facility] ENDOSCOPY;  Service: Gastroenterology;  Laterality: N/A;   ESOPHAGOGASTRODUODENOSCOPY (EGD) WITH PROPOFOL N/A 08/09/2023   Procedure: ESOPHAGOGASTRODUODENOSCOPY (EGD) WITH PROPOFOL;  Surgeon: Regis Bill, MD;  Location: [facility] ENDOSCOPY;  Service: Endoscopy;  Laterality: N/A;  ESOPHAGOGASTRODUODENOSCOPY (EGD) WITH PROPOFOL N/A 08/19/2023   Procedure: ESOPHAGOGASTRODUODENOSCOPY (EGD) WITH PROPOFOL;  Surgeon: Regis Bill, MD;  Location: [facility] ENDOSCOPY;  Service: Endoscopy;  Laterality: N/A;   EVALUATION UNDER ANESTHESIA WITH HEMORRHOIDECTOMY N/A 10/31/2022   Procedure: EXAM UNDER ANESTHESIA WITH SUTURE LIGATION OF TWO BLEEDING PEDICLES;  Surgeon: Henrene Dodge, MD;  Location: [facility] ORS;  Service: General;  Laterality: N/A;   FLEXIBLE SIGMOIDOSCOPY N/A 10/02/2022   Procedure: FLEXIBLE SIGMOIDOSCOPY;  Surgeon: Meryl Dare, MD;  Location: [facility] ENDOSCOPY;   Service: Gastroenterology;  Laterality: N/A;   HEMOSTASIS CONTROL  08/09/2023   Procedure: HEMOSTASIS CONTROL;  Surgeon: Regis Bill, MD;  Location: [facility] ENDOSCOPY;  Service: Endoscopy;;   IR TIPS  04/11/2023   IR US GUIDE VASC ACCESS RIGHT  04/11/2023   IR US GUIDE VASC ACCESS RIGHT  04/11/2023   IR US GUIDE VASC ACCESS RIGHT  04/11/2023   LEFT HEART CATH AND CORONARY ANGIOGRAPHY N/A 11/04/2022   Procedure: LEFT HEART CATH AND CORONARY ANGIOGRAPHY;  Surgeon: Iran Ouch, MD;  Location: [facility] INVASIVE CV LAB;  Service: Cardiovascular;  Laterality: N/A;   RADIOLOGY WITH ANESTHESIA N/A 04/11/2023   Procedure: IR WITH ANESTHESIA;  Surgeon: Oley Balm, MD;  Location: [facility] OR;  Service: Radiology;  Laterality: N/A;   VISCERAL ANGIOGRAPHY N/A 10/24/2022   Procedure: VISCERAL ANGIOGRAPHY;  Surgeon: Annice Needy, MD;  Location: [facility] INVASIVE CV LAB;  Service: Cardiovascular;  Laterality: N/A;   VISCERAL ARTERY INTERVENTION N/A 05/09/2023   Procedure: VISCERAL ARTERY INTERVENTION;  Surgeon: Renford Dills, MD;  Location: [facility] INVASIVE CV LAB;  Service: Cardiovascular;  Laterality: N/A;     Home Medications   Prior to Admission medications   Medication Sig Start Date End Date Taking? Authorizing Provider  albuterol (VENTOLIN HFA) 108 (90 Base) MCG/ACT inhaler Inhale 2 puffs into the lungs every 6 (six) hours as needed for wheezing or shortness of breath. 10/15/23   Chesley Noon, MD  alum & mag hydroxide-simeth (MAALOX/MYLANTA) 200-200-20 MG/5ML suspension Take 30 mLs by mouth every 6 (six) hours as needed for indigestion or heartburn. 12/18/23   Marcelino Duster, MD  chlorpheniramine-HYDROcodone (TUSSIONEX) 10-8 MG/5ML Take 5 mLs by mouth every 12 (twelve) hours as needed for cough. 01/07/24   Irean Hong, MD  docusate sodium (COLACE) 100 MG capsule Take 1 capsule (100 mg total) by mouth 2 (two) times daily as needed (if less than 3 BM"s in past 24 hours). 12/18/23 01/17/24  Marcelino Duster, MD  feeding supplement (ENSURE ENLIVE / ENSURE PLUS) LIQD Take 237 mLs by mouth 2 (two) times daily between meals. 08/25/23   Esaw Grandchild A, DO  fluticasone furoate-vilanterol (BREO ELLIPTA) 100-25 MCG/ACT AEPB Inhale 1 puff into the lungs daily. Patient not taking: Reported on 11/23/2023 10/19/23   Sunnie Nielsen, DO  furosemide (LASIX) 40 MG tablet Take 1 tablet (40 mg total) by mouth daily. Increase to 1 tablet (40 mg total) by mouth TWICE daily (total daily dose 80 mg) as needed for up to 3 days for increased leg swelling, shortness of breath, weight gain 5+ lbs over 1-2 days. Seek medical care if these symptoms are not improving with increased dose. 10/18/23 12/17/23  Sunnie Nielsen, DO  gabapentin (NEURONTIN) 100 MG capsule Take 1 capsule (100 mg total) by mouth 2 (two) times daily. Patient taking differently: Take 300 mg by mouth 2 (two) times daily. 10/18/23 12/12/23  Sunnie Nielsen, DO  ipratropium-albuterol (DUONEB) 0.5-2.5 (3) MG/3ML SOLN Take 3 mLs by nebulization every  2 (two) hours as needed (wheeze, SOB). 10/18/23   Sunnie Nielsen, DO  isosorbide mononitrate (IMDUR) 30 MG 24 hr tablet Take 0.5 tablets (15 mg total) by mouth daily. 11/24/23   Wouk, Wilfred Curtis, MD  magic mouthwash SOLN 12mL Anbesol 30mL Benadryl 30mL Mylanta  5mL swish, gargle & spit q8hr prn throat discomfort 01/07/24   Irean Hong, MD  midodrine (PROAMATINE) 5 MG tablet Take 1 tablet (5 mg total) by mouth 3 (three) times daily with meals. Patient not taking: Reported on 11/23/2023 10/18/23   Sunnie Nielsen, DO  Nebulizers (COMPRESSOR/NEBULIZER) MISC Nebulizer + mask/hoses per patient preference and insurance coverage, use as directed 10/18/23   Sunnie Nielsen, DO  ondansetron (ZOFRAN-ODT) 4 MG disintegrating tablet Take 1 tablet (4 mg total) by mouth every 8 (eight) hours as needed for nausea or vomiting. 01/02/24   Jene Every, MD  pantoprazole (PROTONIX) 40 MG tablet Take 1 tablet  (40 mg total) by mouth 2 (two) times daily. 10/18/23   Sunnie Nielsen, DO  polyethylene glycol powder (GLYCOLAX/MIRALAX) 17 GM/SCOOP powder Take 17 g by mouth daily as needed (if less than 3 BM"s in past 24 hours). 08/25/23   Esaw Grandchild A, DO  potassium chloride SA (KLOR-CON M) 20 MEQ tablet Take 1 tablet (20 mEq total) by mouth daily. 10/18/23   Sunnie Nielsen, DO  Respiratory Therapy Supplies (NEBULIZER/TUBING/MOUTHPIECE) KIT As directed 10/18/23   Sunnie Nielsen, DO  rosuvastatin (CRESTOR) 40 MG tablet TAKE 1 TABLET BY MOUTH EVERY DAY 10/20/23   Medina-Vargas, Monina C, NP     Allergies  Penicillins   Family History   Family History  Problem Relation Age of Onset   Coronary artery disease Mother        alive   Other Other        2 sisters alive and well   Alcohol abuse Father        died @ 20   Other Paternal Grandmother        PACER     Physical Exam  Triage Vital Signs: ED Triage Vitals  Encounter Vitals Group     BP 01/07/24 2127 128/69     Systolic BP Percentile --      Diastolic BP Percentile --      Pulse Rate 01/07/24 2127 (!) 112     Resp 01/07/24 2127 18     Temp 01/07/24 2127 97.9 F (36.6 C)     Temp Source 01/07/24 2127 Oral     SpO2 01/07/24 2127 95 %     Weight 01/07/24 2124 182 lb 15.7 oz (83 kg)     Height 01/07/24 2124 5\' 5"  (1.651 m)     Head Circumference --      Peak Flow --      Pain Score 01/07/24 2124 9     Pain Loc --      Pain Education --      Exclude from Growth Chart --     Updated Vital Signs: BP 114/65 (BP Location: Right Arm)   Pulse 95   Temp 98.9 F (37.2 C) (Oral)   Resp 14   Ht 5\' 5"  (1.651 m)   Wt 83 kg   SpO2 96%   BMI 30.45 kg/m    General: Awake, mild distress.  CV:  Tachycardic.  Good peripheral perfusion.  Resp:  Normal effort.  Diminished, scattered wheezing. Abd:  Nontender.  No distention.  Other:  No pedal edema.   ED Results / Procedures /  Treatments  Labs (all labs ordered are listed,  but only abnormal results are displayed) Labs Reviewed  BASIC METABOLIC PANEL - Abnormal; Notable for the following components:      Result Value   Potassium 3.2 (*)    CO2 15 (*)    Glucose, Bld 215 (*)    Creatinine, Ser 0.56 (*)    Calcium 8.3 (*)    Anion gap 20 (*)    All other components within normal limits  CBC - Abnormal; Notable for the following components:   RBC 3.35 (*)    Hemoglobin 10.0 (*)    HCT 30.7 (*)    RDW 16.5 (*)    Platelets 93 (*)    All other components within normal limits  BLOOD GAS, VENOUS - Abnormal; Notable for the following components:   pH, Ven 7.48 (*)    pCO2, Ven 33 (*)    pO2, Ven 67 (*)    All other components within normal limits  URINALYSIS, ROUTINE W REFLEX MICROSCOPIC - Abnormal; Notable for the following components:   Color, Urine YELLOW (*)    APPearance CLEAR (*)    Glucose, UA 50 (*)    All other components within normal limits  ETHANOL - Abnormal; Notable for the following components:   Alcohol, Ethyl (B) 243 (*)    All other components within normal limits  HEPATIC FUNCTION PANEL - Abnormal; Notable for the following components:   Albumin 3.2 (*)    AST 71 (*)    Alkaline Phosphatase 163 (*)    Total Bilirubin 3.5 (*)    Bilirubin, Direct 1.2 (*)    Indirect Bilirubin 2.3 (*)    All other components within normal limits  TROPONIN I (HIGH SENSITIVITY) - Abnormal; Notable for the following components:   Troponin I (High Sensitivity) 139 (*)    All other components within normal limits  MAGNESIUM  TROPONIN I (HIGH SENSITIVITY)     EKG  ED ECG REPORT I, Tsuneo Faison J, the attending physician, personally viewed and interpreted this ECG.   Date: 01/08/2024  EKG Time: 0133  Rate: 95  Rhythm: normal sinus rhythm  Axis: Normal  Intervals:none  ST&T Change: Nonspecific    RADIOLOGY Patient had chest x-ray done less than 24 hours ago which was negative for pneumonia   Official radiology report(s): No results  found.    PROCEDURES:  Critical Care performed: Yes, see critical care procedure note(s)  CRITICAL CARE Performed by: Irean Hong   Total critical care time: 30 minutes  Critical care time was exclusive of separately billable procedures and treating other patients.  Critical care was necessary to treat or prevent imminent or life-threatening deterioration.  Critical care was time spent personally by me on the following activities: development of treatment plan with patient and/or surrogate as well as nursing, discussions with consultants, evaluation of patient's response to treatment, examination of patient, obtaining history from patient or surrogate, ordering and performing treatments and interventions, ordering and review of laboratory studies, ordering and review of radiographic studies, pulse oximetry and re-evaluation of patient's condition.   Marland Kitchen1-3 Lead EKG Interpretation  Performed by: Irean Hong, MD Authorized by: Irean Hong, MD     Interpretation: normal     ECG rate:  95   ECG rate assessment: normal     Rhythm: sinus rhythm     Ectopy: none     Conduction: normal   Comments:     Patient placed on cardiac monitor to evaluate  for arrhythmias    MEDICATIONS ORDERED IN ED: Medications  methylPREDNISolone sodium succinate (SOLU-MEDROL) 125 mg/2 mL injection 125 mg (has no administration in time range)  thiamine (VITAMIN B1) tablet 100 mg (has no administration in time range)  LORazepam (ATIVAN) injection 0-4 mg (has no administration in time range)  heparin ADULT infusion 100 units/mL (25000 units/238mL) (has no administration in time range)  heparin injection 4,000 Units (has no administration in time range)  sodium chloride 0.9 % bolus 1,000 mL (1,000 mLs Intravenous New Bag/Given 01/08/24 0139)  ipratropium-albuterol (DUONEB) 0.5-2.5 (3) MG/3ML nebulizer solution 3 mL (3 mLs Nebulization Given 01/08/24 0136)  potassium chloride SA (KLOR-CON M) CR tablet 40 mEq  (40 mEq Oral Given 01/08/24 0142)     IMPRESSION / MDM / ASSESSMENT AND PLAN / ED COURSE  I reviewed the triage vital signs and the nursing notes.                             53 year old male who returns to the ED from home for continued chest pain and shortness of breath. Differential includes, but is not limited to, viral syndrome, bronchitis including COPD exacerbation, pneumonia, reactive airway disease including asthma, CHF including exacerbation with or without pulmonary/interstitial edema, pneumothorax, ACS, thoracic trauma, and pulmonary embolism.  I personally reviewed patient's records and note his ED visit from yesterday.  Patient's presentation is most consistent with acute complicated illness / injury requiring diagnostic workup.  The patient is on the cardiac monitor to evaluate for evidence of arrhythmia and/or significant heart rate changes.  Laboratory results notable for mild hypokalemia, elevated anion gap likely secondary to alcoholic ketoacidosis.  Elevated LFTs keeping with patient's alcoholism.  Initial troponin negative.  Will place on CIWA, replete potassium orally, administer IV Solu-Medrol, DuoNeb.  Will consult hospitalist services for evaluation and admission.  Clinical Course as of 01/08/24 0143  Thu Jan 08, 2024  0139 Repeat troponin has increased to 139; will start heparin. [JS]    Clinical Course User Index [JS] Irean Hong, MD     FINAL CLINICAL IMPRESSION(S) / ED DIAGNOSES   Final diagnoses:  Chest pain, unspecified type  COPD with acute exacerbation (HCC)  Alcoholic ketoacidosis  Alcoholic intoxication without complication (HCC)  NSTEMI (non-ST elevated myocardial infarction) (HCC)     Rx / DC Orders   ED Discharge Orders     None        Note:  This document was prepared using Dragon voice recognition software and may include unintentional dictation errors.   Irean Hong, MD 01/08/24 [phone]

## 2024-01-08 NOTE — ED Notes (Signed)
MD contacted for pain medications

## 2024-01-08 NOTE — Progress Notes (Signed)
ANTICOAGULATION CONSULT NOTE  Pharmacy Consult for heparin infusion Indication: ACS/STEMI  Allergies  Allergen Reactions   Penicillins Hives and Rash    Tolerated Ceftriaxone 10/2022    Patient Measurements: Height: 5\' 5"  (165.1 cm) Weight: 83 kg (182 lb 15.7 oz) IBW/kg (Calculated) : 61.5 Heparin Dosing Weight: 78.7 kg  Vital Signs: Temp: 97.9 F (36.6 C) (01/23 1022) Temp Source: Oral (01/23 1022) BP: 96/60 (01/23 1300) Pulse Rate: 99 (01/23 1300)  Labs: Recent Labs    01/07/24 2126 01/08/24 0103 01/08/24 0155 01/08/24 0747  HGB 10.0*  --   --   --   HCT 30.7*  --   --   --   PLT 93*  --   --   --   APTT  --   --  37*  --   LABPROT  --   --  18.3*  --   INR  --   --  1.5*  --   HEPARINUNFRC  --   --   --  0.39  CREATININE 0.56*  --   --   --   CKTOTAL  --   --   --  79  TROPONINIHS 16 139*  --  293*    Estimated Creatinine Clearance: 108.3 mL/min (A) (by C-G formula based on SCr of 0.56 mg/dL (L)).   Medical History: Past Medical History:  Diagnosis Date   (HFpEF) heart failure with preserved ejection fraction (HCC)    Addison disease (HCC)    CAD (coronary artery disease)    a. 01/2011 Anterior STEMI/Cath/PCI: LM nl, LAD 100d (3.5x74mm Vision BMS placed), LCX 72m, RI 50, RCA min irregs, EF 40% w/ apical, inferoapical HK; b. 10/2022 NSTSEMI/Cath: LM nl, LAD 5p/m, RI 85, LCX 110m, OM1 mild dzs, OM3 100 fills via L->L collats from dLAD, RCA 80p, 166m, RPDA fills via collats from LAD. EF 45-50%-->Med Rx.   CAD, multiple vessel    Cardiac arrest - ventricular fibrillation    a. In setting of STEMI 01/2011   Chronic, continuous use of opioids    Cocaine abuse (HCC)    COPD (chronic obstructive pulmonary disease) (HCC)    Depression with anxiety    ETOH abuse    a. 6-12 beers/day   GERD (gastroesophageal reflux disease)    GI bleed    Hemorrhoids    Hyperlipidemia    Hypertension    Ischemic cardiomyopathy    a. 06/2011 Echo: EF 45-50%, No rwma; b. 10/2022  Echo: EF 55-60%, no rwma, nl RV fxn.   Liver cirrhosis, alcoholic (HCC)    Marijuana abuse    Migraines    Noncompliance with immunization regimen    Obesity (BMI 30.0-34.9)    Orthostatic hypertension    midodrine   Portal hypertension (HCC)    s/p TIPS   STEMI (ST elevation myocardial infarction) (HCC) 2012   Tobacco abuse    a. 1/2 ppd x 26 yrs    Assessment: Pt is a 53 yo male presenting to ED c/o CP & SOB, found with elevated troponin I level, trending up. Hx:CAD, CHF, COPD, cocaine abuse, EtOH abuse, addisons, opioid use, GIB, ETOH liver cirrhosis Hx multiple ED visits for EtOH  Baseline labs:  Hgb 10.0  Plt 93  INR 1.5  (liver dx?)   aPTT 37  Goal of Therapy:  Heparin level 0.3-0.7 units/ml Monitor platelets by anticoagulation protocol: Yes   Plan: heparin level subtherapeutic ---bolus 1200 units IV heparin x 1 ---increase heparin infusion rate to 1200  units/hr ---Will check confirmatory heparin level in 6 hrs after rate change ---CBC daily while on heparin   Clinical Pharmacist 01/08/2024

## 2024-01-08 NOTE — Progress Notes (Signed)
ANTICOAGULATION CONSULT NOTE  Pharmacy Consult for heparin infusion Indication: ACS/STEMI  Allergies  Allergen Reactions   Penicillins Hives and Rash    Tolerated Ceftriaxone 10/2022    Patient Measurements: Height: 5\' 5"  (165.1 cm) Weight: 83 kg (182 lb 15.7 oz) IBW/kg (Calculated) : 61.5 Heparin Dosing Weight: 78.7 kg  Vital Signs: Temp: 98.9 F (37.2 C) (01/23 0042) Temp Source: Oral (01/23 0042) BP: 114/65 (01/23 0104) Pulse Rate: 95 (01/23 0104)  Labs: Recent Labs    01/07/24 2126 01/08/24 0103  HGB 10.0*  --   HCT 30.7*  --   PLT 93*  --   CREATININE 0.56*  --   TROPONINIHS 16 139*    Estimated Creatinine Clearance: 108.3 mL/min (A) (by C-G formula based on SCr of 0.56 mg/dL (L)).   Medical History: Past Medical History:  Diagnosis Date   (HFpEF) heart failure with preserved ejection fraction (HCC)    Addison disease (HCC)    CAD (coronary artery disease)    a. 01/2011 Anterior STEMI/Cath/PCI: LM nl, LAD 100d (3.5x9mm Vision BMS placed), LCX 33m, RI 50, RCA min irregs, EF 40% w/ apical, inferoapical HK; b. 10/2022 NSTSEMI/Cath: LM nl, LAD 5p/m, RI 85, LCX 163m, OM1 mild dzs, OM3 100 fills via L->L collats from dLAD, RCA 80p, 179m, RPDA fills via collats from LAD. EF 45-50%-->Med Rx.   CAD, multiple vessel    Cardiac arrest - ventricular fibrillation    a. In setting of STEMI 01/2011   Chronic, continuous use of opioids    Cocaine abuse (HCC)    COPD (chronic obstructive pulmonary disease) (HCC)    Depression with anxiety    ETOH abuse    a. 6-12 beers/day   GERD (gastroesophageal reflux disease)    GI bleed    Hemorrhoids    Hyperlipidemia    Hypertension    Ischemic cardiomyopathy    a. 06/2011 Echo: EF 45-50%, No rwma; b. 10/2022 Echo: EF 55-60%, no rwma, nl RV fxn.   Liver cirrhosis, alcoholic (HCC)    Marijuana abuse    Migraines    Noncompliance with immunization regimen    Obesity (BMI 30.0-34.9)    Orthostatic hypertension    midodrine    Portal hypertension (HCC)    s/p TIPS   STEMI (ST elevation myocardial infarction) (HCC) 2012   Tobacco abuse    a. 1/2 ppd x 26 yrs    Assessment: Pt is a 53 yo male presenting to ED c/o CP & SOB, found with elevated troponin I level, trending up.  Goal of Therapy:  Heparin level 0.3-0.7 units/ml Monitor platelets by anticoagulation protocol: Yes   Plan:  Bolus 4000 units x 1 Start heparin infusion at 1050 units/hr Will check HL in 6 hr after start of infusion CBC daily while on heparin  Otelia Sergeant, PharmD, [facility] 01/08/2024 1:50 AM

## 2024-01-08 NOTE — ED Notes (Signed)
Blanket provided. CB in reach.

## 2024-01-08 NOTE — H&P (Addendum)
History and Physical    Ethan Wells ZOX:[id number] DOB: [birth year] DOA: 01/08/2024  PCP: Pcp, No (Confirm with patient/family/NH records and if not entered, this has to be entered at [facility] point of entry) Patient coming from: Home  I have personally briefly reviewed patient's old medical records in [facility]  Chief Complaint: chest pain  HPI: Ethan Wells is a 53 y.o. male with medical history significant of alcohol abuse with alcoholic liver cirrhosis with portal hypertension status post TIPS, polysubstance abuse, cocaine abuse, orthostatic hypotension on chronic midodrine, SMA stenosis, HTN, HLD, CAD STEMI, chronic HFpEF, GERD, presented with chest pain.  Symptoms started yesterday afternoon patient started to have pressure-like chest pain centrally located radiating to left shoulder associated with shortness of breath cough no wheezing.  No fever or chills.  Last drink was yesterday afternoon.    ED Course: Tachycardia blood pressure borderline low, nonhypoxic.  Chest x-ray negative for acute findings troponin uptrending 16> 139> 293.  EKG showed no acute ST changes.  Alcohol level 243.  VBG showed 7.48/33/67. Blood work showed potassium 2.2, bicarb 15, glucose 215, creatinine 2.5  Patient was given IV Solu-Medrol, and started on heparin drip  Review of Systems: As per HPI otherwise 14 point review of systems negative.    Past Medical History:  Diagnosis Date   (HFpEF) heart failure with preserved ejection fraction (HCC)    Addison disease (HCC)    CAD (coronary artery disease)    a. 01/2011 Anterior STEMI/Cath/PCI: LM nl, LAD 100d (3.5x70mm Vision BMS placed), LCX 109m, RI 50, RCA min irregs, EF 40% w/ apical, inferoapical HK; b. 10/2022 NSTSEMI/Cath: LM nl, LAD 5p/m, RI 85, LCX 157m, OM1 mild dzs, OM3 100 fills via L->L collats from dLAD, RCA 80p, 114m, RPDA fills via collats from LAD. EF 45-50%-->Med Rx.   CAD, multiple vessel    Cardiac arrest - ventricular  fibrillation    a. In setting of STEMI 01/2011   Chronic, continuous use of opioids    Cocaine abuse (HCC)    COPD (chronic obstructive pulmonary disease) (HCC)    Depression with anxiety    ETOH abuse    a. 6-12 beers/day   GERD (gastroesophageal reflux disease)    GI bleed    Hemorrhoids    Hyperlipidemia    Hypertension    Ischemic cardiomyopathy    a. 06/2011 Echo: EF 45-50%, No rwma; b. 10/2022 Echo: EF 55-60%, no rwma, nl RV fxn.   Liver cirrhosis, alcoholic (HCC)    Marijuana abuse    Migraines    Noncompliance with immunization regimen    Obesity (BMI 30.0-34.9)    Orthostatic hypertension    midodrine   Portal hypertension (HCC)    s/p TIPS   STEMI (ST elevation myocardial infarction) (HCC) 2012   Tobacco abuse    a. 1/2 ppd x 26 yrs    Past Surgical History:  Procedure Laterality Date   COLONOSCOPY  08/09/2021   Procedure: COLONOSCOPY;  Surgeon: Midge Minium, MD;  Location: [facility] ENDOSCOPY;  Service: Endoscopy;;   CORONARY ANGIOPLASTY WITH STENT PLACEMENT     ESOPHAGEAL BANDING  04/08/2023   Procedure: ESOPHAGEAL BANDING;  Surgeon: Tressia Danas, MD;  Location: [facility] ENDOSCOPY;  Service: Gastroenterology;;   ESOPHAGOGASTRODUODENOSCOPY N/A 05/30/2022   Procedure: ESOPHAGOGASTRODUODENOSCOPY (EGD);  Surgeon: Toney Reil, MD;  Location: [facility] ENDOSCOPY;  Service: Gastroenterology;  Laterality: N/A;   ESOPHAGOGASTRODUODENOSCOPY (EGD) WITH PROPOFOL N/A 03/01/2021   Procedure: ESOPHAGOGASTRODUODENOSCOPY (EGD) WITH PROPOFOL;  Surgeon: Lannette Donath  Betti Cruz, MD;  Location: [facility];  Service: Endoscopy;  Laterality: N/A;   ESOPHAGOGASTRODUODENOSCOPY (EGD) WITH PROPOFOL N/A 08/09/2021   Procedure: ESOPHAGOGASTRODUODENOSCOPY (EGD) WITH PROPOFOL;  Surgeon: Midge Minium, MD;  Location: [facility] ENDOSCOPY;  Service: Endoscopy;  Laterality: N/A;   ESOPHAGOGASTRODUODENOSCOPY (EGD) WITH PROPOFOL N/A 03/01/2023   Procedure: ESOPHAGOGASTRODUODENOSCOPY (EGD) WITH PROPOFOL;   Surgeon: Jaynie Collins, DO;  Location: [facility] ENDOSCOPY;  Service: Gastroenterology;  Laterality: N/A;   ESOPHAGOGASTRODUODENOSCOPY (EGD) WITH PROPOFOL N/A 04/08/2023   Procedure: ESOPHAGOGASTRODUODENOSCOPY (EGD) WITH PROPOFOL;  Surgeon: Tressia Danas, MD;  Location: [facility] ENDOSCOPY;  Service: Gastroenterology;  Laterality: N/A;   ESOPHAGOGASTRODUODENOSCOPY (EGD) WITH PROPOFOL N/A 08/09/2023   Procedure: ESOPHAGOGASTRODUODENOSCOPY (EGD) WITH PROPOFOL;  Surgeon: Regis Bill, MD;  Location: [facility] ENDOSCOPY;  Service: Endoscopy;  Laterality: N/A;   ESOPHAGOGASTRODUODENOSCOPY (EGD) WITH PROPOFOL N/A 08/19/2023   Procedure: ESOPHAGOGASTRODUODENOSCOPY (EGD) WITH PROPOFOL;  Surgeon: Regis Bill, MD;  Location: [facility] ENDOSCOPY;  Service: Endoscopy;  Laterality: N/A;   EVALUATION UNDER ANESTHESIA WITH HEMORRHOIDECTOMY N/A 10/31/2022   Procedure: EXAM UNDER ANESTHESIA WITH SUTURE LIGATION OF TWO BLEEDING PEDICLES;  Surgeon: Henrene Dodge, MD;  Location: [facility] ORS;  Service: General;  Laterality: N/A;   FLEXIBLE SIGMOIDOSCOPY N/A 10/02/2022   Procedure: FLEXIBLE SIGMOIDOSCOPY;  Surgeon: Meryl Dare, MD;  Location: [facility] ENDOSCOPY;  Service: Gastroenterology;  Laterality: N/A;   HEMOSTASIS CONTROL  08/09/2023   Procedure: HEMOSTASIS CONTROL;  Surgeon: Regis Bill, MD;  Location: [facility] ENDOSCOPY;  Service: Endoscopy;;   IR TIPS  04/11/2023   IR US GUIDE VASC ACCESS RIGHT  04/11/2023   IR US GUIDE VASC ACCESS RIGHT  04/11/2023   IR US GUIDE VASC ACCESS RIGHT  04/11/2023   LEFT HEART CATH AND CORONARY ANGIOGRAPHY N/A 11/04/2022   Procedure: LEFT HEART CATH AND CORONARY ANGIOGRAPHY;  Surgeon: Iran Ouch, MD;  Location: [facility] INVASIVE CV LAB;  Service: Cardiovascular;  Laterality: N/A;   RADIOLOGY WITH ANESTHESIA N/A 04/11/2023   Procedure: IR WITH ANESTHESIA;  Surgeon: Oley Balm, MD;  Location: [facility] OR;  Service: Radiology;  Laterality: N/A;   VISCERAL ANGIOGRAPHY N/A 10/24/2022    Procedure: VISCERAL ANGIOGRAPHY;  Surgeon: Annice Needy, MD;  Location: [facility] INVASIVE CV LAB;  Service: Cardiovascular;  Laterality: N/A;   VISCERAL ARTERY INTERVENTION N/A 05/09/2023   Procedure: VISCERAL ARTERY INTERVENTION;  Surgeon: Renford Dills, MD;  Location: [facility] INVASIVE CV LAB;  Service: Cardiovascular;  Laterality: N/A;     reports that he has been smoking cigarettes. He has never used smokeless tobacco. He reports current alcohol use of about 2.0 standard drinks of alcohol per week. He reports current drug use. Drug: Cocaine.  Allergies  Allergen Reactions   Penicillins Hives and Rash    Tolerated Ceftriaxone 10/2022    Family History  Problem Relation Age of Onset   Coronary artery disease Mother        alive   Other Other        2 sisters alive and well   Alcohol abuse Father        died @ 56   Other Paternal Grandmother        PACER    Prior to Admission medications   Medication Sig Start Date End Date Taking? Authorizing Provider  albuterol (VENTOLIN HFA) 108 (90 Base) MCG/ACT inhaler Inhale 2 puffs into the lungs every 6 (six) hours as needed for wheezing or shortness of breath. 10/15/23   Chesley Noon, MD  alum & mag hydroxide-simeth (MAALOX/MYLANTA) 200-200-20 MG/5ML  suspension Take 30 mLs by mouth every 6 (six) hours as needed for indigestion or heartburn. 12/18/23   Marcelino Duster, MD  chlorpheniramine-HYDROcodone (TUSSIONEX) 10-8 MG/5ML Take 5 mLs by mouth every 12 (twelve) hours as needed for cough. 01/07/24   Irean Hong, MD  docusate sodium (COLACE) 100 MG capsule Take 1 capsule (100 mg total) by mouth 2 (two) times daily as needed (if less than 3 BM"s in past 24 hours). 12/18/23 01/17/24  Marcelino Duster, MD  feeding supplement (ENSURE ENLIVE / ENSURE PLUS) LIQD Take 237 mLs by mouth 2 (two) times daily between meals. 08/25/23   Esaw Grandchild A, DO  fluticasone furoate-vilanterol (BREO ELLIPTA) 100-25 MCG/ACT AEPB Inhale 1 puff into the lungs  daily. Patient not taking: Reported on 11/23/2023 10/19/23   Sunnie Nielsen, DO  furosemide (LASIX) 40 MG tablet Take 1 tablet (40 mg total) by mouth daily. Increase to 1 tablet (40 mg total) by mouth TWICE daily (total daily dose 80 mg) as needed for up to 3 days for increased leg swelling, shortness of breath, weight gain 5+ lbs over 1-2 days. Seek medical care if these symptoms are not improving with increased dose. 10/18/23 12/17/23  Sunnie Nielsen, DO  gabapentin (NEURONTIN) 100 MG capsule Take 1 capsule (100 mg total) by mouth 2 (two) times daily. Patient taking differently: Take 300 mg by mouth 2 (two) times daily. 10/18/23 12/12/23  Sunnie Nielsen, DO  ipratropium-albuterol (DUONEB) 0.5-2.5 (3) MG/3ML SOLN Take 3 mLs by nebulization every 2 (two) hours as needed (wheeze, SOB). 10/18/23   Sunnie Nielsen, DO  isosorbide mononitrate (IMDUR) 30 MG 24 hr tablet Take 0.5 tablets (15 mg total) by mouth daily. 11/24/23   Wouk, Wilfred Curtis, MD  magic mouthwash SOLN 12mL Anbesol 30mL Benadryl 30mL Mylanta  5mL swish, gargle & spit q8hr prn throat discomfort 01/07/24   Irean Hong, MD  midodrine (PROAMATINE) 5 MG tablet Take 1 tablet (5 mg total) by mouth 3 (three) times daily with meals. Patient not taking: Reported on 11/23/2023 10/18/23   Sunnie Nielsen, DO  Nebulizers (COMPRESSOR/NEBULIZER) MISC Nebulizer + mask/hoses per patient preference and insurance coverage, use as directed 10/18/23   Sunnie Nielsen, DO  ondansetron (ZOFRAN-ODT) 4 MG disintegrating tablet Take 1 tablet (4 mg total) by mouth every 8 (eight) hours as needed for nausea or vomiting. 01/02/24   Jene Every, MD  pantoprazole (PROTONIX) 40 MG tablet Take 1 tablet (40 mg total) by mouth 2 (two) times daily. 10/18/23   Sunnie Nielsen, DO  polyethylene glycol powder (GLYCOLAX/MIRALAX) 17 GM/SCOOP powder Take 17 g by mouth daily as needed (if less than 3 BM"s in past 24 hours). 08/25/23   Esaw Grandchild A, DO   potassium chloride SA (KLOR-CON M) 20 MEQ tablet Take 1 tablet (20 mEq total) by mouth daily. 10/18/23   Sunnie Nielsen, DO  Respiratory Therapy Supplies (NEBULIZER/TUBING/MOUTHPIECE) KIT As directed 10/18/23   Sunnie Nielsen, DO  rosuvastatin (CRESTOR) 40 MG tablet TAKE 1 TABLET BY MOUTH EVERY DAY 10/20/23   Medina-Vargas, Avanell Shackleton C, NP    Physical Exam: Vitals:   01/08/24 0517 01/08/24 0518 01/08/24 0629 01/08/24 0745  BP:  (!) 98/47 125/79 107/62  Pulse:  (!) 112 (!) 115 (!) 106  Resp:    (!) 23  Temp: 98 F (36.7 C)     TempSrc: Oral     SpO2:    92%  Weight:      Height:        Constitutional: NAD, calm,  comfortable Vitals:   01/08/24 0517 01/08/24 0518 01/08/24 0629 01/08/24 0745  BP:  (!) 98/47 125/79 107/62  Pulse:  (!) 112 (!) 115 (!) 106  Resp:    (!) 23  Temp: 98 F (36.7 C)     TempSrc: Oral     SpO2:    92%  Weight:      Height:       Eyes: PERRL, lids and conjunctivae normal ENMT: Mucous membranes are moist. Posterior pharynx clear of any exudate or lesions.Normal dentition.  Neck: normal, supple, no masses, no thyromegaly Respiratory: clear to auscultation bilaterally, scattered wheezing, no crackles, increasing respiratory effort. No accessory muscle use.  Cardiovascular: Regular rate and rhythm, no murmurs / rubs / gallops. No extremity edema. 2+ pedal pulses. No carotid bruits.  Abdomen: no tenderness, no masses palpated. No hepatosplenomegaly. Bowel sounds positive.  Musculoskeletal: no clubbing / cyanosis. No joint deformity upper and lower extremities. Good ROM, no contractures. Normal muscle tone.  Skin: no rashes, lesions, ulcers. No induration Neurologic: CN 2-12 grossly intact. Sensation intact, DTR normal. Strength 5/5 in all 4.  Psychiatric: Normal judgment and insight. Alert and oriented x 3. Normal mood.     Labs on Admission: I have personally reviewed following labs and imaging studies  CBC: Recent Labs  Lab 01/02/24 0329  01/03/24 0110 01/07/24 2126  WBC 8.4 6.5 4.4  HGB 10.8* 9.9* 10.0*  HCT 32.5* 30.7* 30.7*  MCV 91.0 93.3 91.6  PLT 130* 109* 93*   Basic Metabolic Panel: Recent Labs  Lab 01/02/24 0329 01/03/24 0110 01/03/24 0411 01/07/24 2126  NA 141 141  --  138  K 3.4* 3.2*  --  3.2*  CL 108 106  --  103  CO2 21* 20*  --  15*  GLUCOSE 110* 111*  --  215*  BUN <5* 5*  --  8  CREATININE 0.55* 0.60*  --  0.56*  CALCIUM 8.6* 8.4*  --  8.3*  MG  --   --  1.6* 1.7   GFR: Estimated Creatinine Clearance: 108.3 mL/min (A) (by C-G formula based on SCr of 0.56 mg/dL (L)). Liver Function Tests: Recent Labs  Lab 01/03/24 0110 01/07/24 2126  AST 73* 71*  ALT 19 18  ALKPHOS 194* 163*  BILITOT 2.8* 3.5*  PROT 7.6 7.7  ALBUMIN 3.3* 3.2*   Recent Labs  Lab 01/03/24 0110  LIPASE 49   No results for input(s): "AMMONIA" in the last 168 hours. Coagulation Profile: Recent Labs  Lab 01/08/24 0155  INR 1.5*   Cardiac Enzymes: Recent Labs  Lab 01/08/24 0747  CKTOTAL 79   BNP (last 3 results) No results for input(s): "PROBNP" in the last 8760 hours. HbA1C: No results for input(s): "HGBA1C" in the last 72 hours. CBG: No results for input(s): "GLUCAP" in the last 168 hours. Lipid Profile: No results for input(s): "CHOL", "HDL", "LDLCALC", "TRIG", "CHOLHDL", "LDLDIRECT" in the last 72 hours. Thyroid Function Tests: No results for input(s): "TSH", "T4TOTAL", "FREET4", "T3FREE", "THYROIDAB" in the last 72 hours. Anemia Panel: No results for input(s): "VITAMINB12", "FOLATE", "FERRITIN", "TIBC", "IRON", "RETICCTPCT" in the last 72 hours. Urine analysis:    Component Value Date/Time   COLORURINE YELLOW (A) 01/08/2024 0103   APPEARANCEUR CLEAR (A) 01/08/2024 0103   APPEARANCEUR Clear 03/16/2012 1315   LABSPEC 1.014 01/08/2024 0103   LABSPEC 1.013 03/16/2012 1315   PHURINE 7.0 01/08/2024 0103   GLUCOSEU 50 (A) 01/08/2024 0103   GLUCOSEU 50 mg/dL 82/95/6213 0865   HGBUR  NEGATIVE  01/08/2024 0103   BILIRUBINUR NEGATIVE 01/08/2024 0103   BILIRUBINUR Negative 03/16/2012 1315   KETONESUR NEGATIVE 01/08/2024 0103   PROTEINUR NEGATIVE 01/08/2024 0103   NITRITE NEGATIVE 01/08/2024 0103   LEUKOCYTESUR NEGATIVE 01/08/2024 0103   LEUKOCYTESUR Negative 03/16/2012 1315    Radiological Exams on Admission: DG Chest 1 View Result Date: 01/07/2024 CLINICAL DATA:  Cough and generalized body aches EXAM: CHEST  1 VIEW COMPARISON:  01/03/2024 FINDINGS: Stable cardiomediastinal silhouette. No focal consolidation, pleural effusion, or pneumothorax. No displaced rib fractures. IMPRESSION: No active disease. Electronically Signed   By: Minerva Fester M.D.   On: 01/07/2024 01:07    EKG: Independently reviewed.  Minimum ST depressions on V5 and V6  Assessment/Plan Principal Problem:   NSTEMI (non-ST elevated myocardial infarction) (HCC) Active Problems:   Cocaine abuse (HCC)   Alcohol abuse  (please populate well all problems here in Problem List. (For example, if patient is on BP meds at home and you resume or decide to hold them, it is a problem that needs to be her. Same for CAD, COPD, HLD and so on)  NSTEMI -Cocaine induced likely -As patient still has ongoing chest pain in the ED, will start patient on Ativan p.o. for 2 days -Start Coreg -Consult cardiology.  Review of patient record on last admission, when cardiology commented that patient was not a candidate for ischemic workup probably because of ongoing cocaine abuse history and Hx of cirrhosis and elevated bleeding risk.  Dr.Gollan agreed with conservative management and no further ischemia workup. -On last admission, patient had similar NSTEMI and treated with heparin drip however heparin drip had to be discontinued prematurely due to drop in H&H.  This time, plan to continue heparin drip as well as H&H stable.  Will check H&H this afternoon and tomorrow morning, plan to run heparin drip at least 24-48 hours if  tolerated. -Echo as per cardiology.  Noticed that echocardiogram was done several weeks ago.  Acute COPD exacerbation -Improving, plan to continue p.o. prednisone -Pulmicort -DuoNebs -Incentive spirometry  Alcohol intoxication -Agreed with CIWA protocol with thiamine dose  Hypokalemia -P.o. replacement  Hyperglycemia -No history of diabetes, recheck glucose level tomorrow  Chronic alcoholic hepatitis -Calculated discriminant factor= 16, no indication for steroid  Chronic hypotension -Continue midodrine -Noticed that the patient has an abnormal cortisol level, recommend outpatient endocrinology follow-up for adrenal insufficiency workup  Chronic HFpEF -Euvolemic -Hold off diuresis  Cirrhosis -Stable, no symptoms or signs of acute decompensation -INR and albumin level stable.   DVT prophylaxis: Heparin drip Code Status: Full code Family Communication: None at bedside Disposition Plan: Patient is sick with NSTEMI requiring inpatient cardiology management, expect more than 2 midnight hospital stay Consults called: Cardiology Dr. Mariah Milling Admission status: PCU admit   Emeline General MD [facility] Pager [phone] 01/08/2024, 9:31 AM

## 2024-01-08 NOTE — Progress Notes (Signed)
ANTICOAGULATION CONSULT NOTE  Pharmacy Consult for heparin infusion Indication: ACS/STEMI  Allergies  Allergen Reactions   Penicillins Hives and Rash    Tolerated Ceftriaxone 10/2022    Patient Measurements: Height: 5\' 5"  (165.1 cm) Weight: 83 kg (182 lb 15.7 oz) IBW/kg (Calculated) : 61.5 Heparin Dosing Weight: 78.7 kg  Vital Signs: Temp: 98 F (36.7 C) (01/23 0517) Temp Source: Oral (01/23 0517) BP: 107/62 (01/23 0745) Pulse Rate: 106 (01/23 0745)  Labs: Recent Labs    01/07/24 2126 01/08/24 0103 01/08/24 0155 01/08/24 0747  HGB 10.0*  --   --   --   HCT 30.7*  --   --   --   PLT 93*  --   --   --   APTT  --   --  37*  --   LABPROT  --   --  18.3*  --   INR  --   --  1.5*  --   HEPARINUNFRC  --   --   --  0.39  CREATININE 0.56*  --   --   --   TROPONINIHS 16 139*  --   --     Estimated Creatinine Clearance: 108.3 mL/min (A) (by C-G formula based on SCr of 0.56 mg/dL (L)).   Medical History: Past Medical History:  Diagnosis Date   (HFpEF) heart failure with preserved ejection fraction (HCC)    Addison disease (HCC)    CAD (coronary artery disease)    a. 01/2011 Anterior STEMI/Cath/PCI: LM nl, LAD 100d (3.5x31mm Vision BMS placed), LCX 38m, RI 50, RCA min irregs, EF 40% w/ apical, inferoapical HK; b. 10/2022 NSTSEMI/Cath: LM nl, LAD 5p/m, RI 85, LCX 145m, OM1 mild dzs, OM3 100 fills via L->L collats from dLAD, RCA 80p, 118m, RPDA fills via collats from LAD. EF 45-50%-->Med Rx.   CAD, multiple vessel    Cardiac arrest - ventricular fibrillation    a. In setting of STEMI 01/2011   Chronic, continuous use of opioids    Cocaine abuse (HCC)    COPD (chronic obstructive pulmonary disease) (HCC)    Depression with anxiety    ETOH abuse    a. 6-12 beers/day   GERD (gastroesophageal reflux disease)    GI bleed    Hemorrhoids    Hyperlipidemia    Hypertension    Ischemic cardiomyopathy    a. 06/2011 Echo: EF 45-50%, No rwma; b. 10/2022 Echo: EF 55-60%, no rwma,  nl RV fxn.   Liver cirrhosis, alcoholic (HCC)    Marijuana abuse    Migraines    Noncompliance with immunization regimen    Obesity (BMI 30.0-34.9)    Orthostatic hypertension    midodrine   Portal hypertension (HCC)    s/p TIPS   STEMI (ST elevation myocardial infarction) (HCC) 2012   Tobacco abuse    a. 1/2 ppd x 26 yrs    Assessment: Pt is a 53 yo male presenting to ED c/o CP & SOB, found with elevated troponin I level, trending up. Hx:CAD, CHF, COPD, cocaine abuse, EtOH abuse, addisons, opioid use, GIB, ETOH liver cirrhosis Hx multiple ED visits for EtOH  Baseline labs:  Hgb 10.0  Plt 93  INR 1.5  (liver dx?)   aPTT 37  1/23 0747 HL 0.39  Therapeutic x1   Goal of Therapy:  Heparin level 0.3-0.7 units/ml Monitor platelets by anticoagulation protocol: Yes   Plan:  1/23 0747 HL 0.39  Therapeutic x1 Continue heparin infusion at 1050 units/hr Will check confirmatory  HL in 6 hr  CBC daily while on heparin  Ethan Wells PharmD Clinical Pharmacist 01/08/2024

## 2024-01-09 ENCOUNTER — Other Ambulatory Visit (HOSPITAL_COMMUNITY): Payer: Self-pay

## 2024-01-09 ENCOUNTER — Telehealth (HOSPITAL_COMMUNITY): Payer: Self-pay | Admitting: Pharmacy Technician

## 2024-01-09 DIAGNOSIS — F1410 Cocaine abuse, uncomplicated: Secondary | ICD-10-CM | POA: Diagnosis not present

## 2024-01-09 DIAGNOSIS — J441 Chronic obstructive pulmonary disease with (acute) exacerbation: Secondary | ICD-10-CM

## 2024-01-09 DIAGNOSIS — I214 Non-ST elevation (NSTEMI) myocardial infarction: Secondary | ICD-10-CM

## 2024-01-09 LAB — CBC
HCT: 25.3 % — ABNORMAL LOW (ref 39.0–52.0)
Hemoglobin: 8.3 g/dL — ABNORMAL LOW (ref 13.0–17.0)
MCH: 30.1 pg (ref 26.0–34.0)
MCHC: 32.8 g/dL (ref 30.0–36.0)
MCV: 91.7 fL (ref 80.0–100.0)
Platelets: 89 10*3/uL — ABNORMAL LOW (ref 150–400)
RBC: 2.76 MIL/uL — ABNORMAL LOW (ref 4.22–5.81)
RDW: 17.1 % — ABNORMAL HIGH (ref 11.5–15.5)
WBC: 7 10*3/uL (ref 4.0–10.5)
nRBC: 0 % (ref 0.0–0.2)

## 2024-01-09 LAB — HEPARIN LEVEL (UNFRACTIONATED)
Heparin Unfractionated: 0.39 [IU]/mL (ref 0.30–0.70)
Heparin Unfractionated: 0.47 [IU]/mL (ref 0.30–0.70)

## 2024-01-09 LAB — COMPREHENSIVE METABOLIC PANEL
ALT: 21 U/L (ref 0–44)
AST: 56 U/L — ABNORMAL HIGH (ref 15–41)
Albumin: 2.8 g/dL — ABNORMAL LOW (ref 3.5–5.0)
Alkaline Phosphatase: 129 U/L — ABNORMAL HIGH (ref 38–126)
Anion gap: 9 (ref 5–15)
BUN: 16 mg/dL (ref 6–20)
CO2: 21 mmol/L — ABNORMAL LOW (ref 22–32)
Calcium: 8 mg/dL — ABNORMAL LOW (ref 8.9–10.3)
Chloride: 106 mmol/L (ref 98–111)
Creatinine, Ser: 0.68 mg/dL (ref 0.61–1.24)
GFR, Estimated: >60 mL/min (ref >60)
Glucose, Bld: 157 mg/dL — ABNORMAL HIGH (ref 70–99)
Potassium: 3.4 mmol/L — ABNORMAL LOW (ref 3.5–5.1)
Sodium: 136 mmol/L (ref 135–145)
Total Bilirubin: 4.2 mg/dL — ABNORMAL HIGH (ref 0.0–1.2)
Total Protein: 6.6 g/dL (ref 6.5–8.1)

## 2024-01-09 LAB — TROPONIN I (HIGH SENSITIVITY): Troponin I (High Sensitivity): 230 ng/L (ref <18)

## 2024-01-09 LAB — BRAIN NATRIURETIC PEPTIDE: B Natriuretic Peptide: 614.1 pg/mL — ABNORMAL HIGH (ref 0.0–100.0)

## 2024-01-09 MED ORDER — ENOXAPARIN SODIUM 40 MG/0.4ML IJ SOSY
40.0000 mg | PREFILLED_SYRINGE | INTRAMUSCULAR | Status: DC
  Administered 2024-01-10 – 2024-01-14 (×5): 40 mg via SUBCUTANEOUS
  Filled 2024-01-09 (×5): qty 0.4

## 2024-01-09 MED ORDER — POTASSIUM CHLORIDE CRYS ER 20 MEQ PO TBCR
40.0000 meq | EXTENDED_RELEASE_TABLET | Freq: Once | ORAL | Status: AC
  Administered 2024-01-09: 40 meq via ORAL
  Filled 2024-01-09: qty 2

## 2024-01-09 MED ORDER — OXYCODONE-ACETAMINOPHEN 5-325 MG PO TABS
1.0000 | ORAL_TABLET | ORAL | Status: DC | PRN
  Administered 2024-01-09 – 2024-01-14 (×21): 1 via ORAL
  Filled 2024-01-09 (×23): qty 1

## 2024-01-09 MED ORDER — TORSEMIDE 20 MG PO TABS
20.0000 mg | ORAL_TABLET | Freq: Every day | ORAL | Status: DC
  Administered 2024-01-09 – 2024-01-10 (×2): 20 mg via ORAL
  Filled 2024-01-09 (×2): qty 1

## 2024-01-09 MED ORDER — MAGNESIUM SULFATE 2 GM/50ML IV SOLN
2.0000 g | Freq: Once | INTRAVENOUS | Status: AC
  Administered 2024-01-09: 2 g via INTRAVENOUS
  Filled 2024-01-09: qty 50

## 2024-01-09 MED ORDER — EZETIMIBE 10 MG PO TABS
10.0000 mg | ORAL_TABLET | Freq: Every day | ORAL | Status: DC
  Administered 2024-01-09 – 2024-01-13 (×5): 10 mg via ORAL
  Filled 2024-01-09 (×5): qty 1

## 2024-01-09 MED ORDER — POLYSACCHARIDE IRON COMPLEX 150 MG PO CAPS
150.0000 mg | ORAL_CAPSULE | Freq: Every day | ORAL | Status: DC
  Administered 2024-01-09 – 2024-01-14 (×6): 150 mg via ORAL
  Filled 2024-01-09 (×6): qty 1

## 2024-01-09 MED ORDER — SODIUM BICARBONATE 650 MG PO TABS
650.0000 mg | ORAL_TABLET | Freq: Two times a day (BID) | ORAL | Status: DC
Start: 2024-01-09 — End: 2024-01-10
  Administered 2024-01-09 – 2024-01-10 (×3): 650 mg via ORAL
  Filled 2024-01-09 (×4): qty 1

## 2024-01-09 NOTE — Progress Notes (Signed)
ANTICOAGULATION CONSULT NOTE  Pharmacy Consult for heparin infusion Indication: ACS/STEMI  Allergies  Allergen Reactions   Penicillins Hives and Rash    Tolerated Ceftriaxone 10/2022    Patient Measurements: Height: 5\' 5"  (165.1 cm) Weight: 85.7 kg (188 lb 15 oz) IBW/kg (Calculated) : 61.5 Heparin Dosing Weight: 78.7 kg  Vital Signs: Temp: 97.8 F (36.6 C) (01/24 0442) Temp Source: Oral (01/24 0442) BP: 120/60 (01/24 0442) Pulse Rate: 90 (01/24 0442)  Labs: Recent Labs    01/07/24 2126 01/08/24 0103 01/08/24 0155 01/08/24 0747 01/08/24 1449 01/08/24 1955 01/09/24 0513  HGB 10.0*  --   --   --  7.9*  --  8.3*  HCT 30.7*  --   --   --  23.5*  --  25.3*  PLT 93*  --   --   --   --   --  89*  APTT  --   --  37*  --   --   --   --   LABPROT  --   --  18.3*  --   --   --   --   INR  --   --  1.5*  --   --   --   --   HEPARINUNFRC  --   --   --  0.39  --  0.28* 0.39  CREATININE 0.56*  --   --   --   --   --  0.68  CKTOTAL  --   --   --  79  --   --   --   TROPONINIHS 16   < >  --  293* 372*  --  230*   < > = values in this interval not displayed.    Estimated Creatinine Clearance: 110 mL/min (by C-G formula based on SCr of 0.68 mg/dL).   Medical History: Past Medical History:  Diagnosis Date   (HFpEF) heart failure with preserved ejection fraction (HCC)    Addison disease (HCC)    CAD (coronary artery disease)    a. 01/2011 Anterior STEMI/Cath/PCI: LM nl, LAD 100d (3.5x78mm Vision BMS placed), LCX 22m, RI 50, RCA min irregs, EF 40% w/ apical, inferoapical HK; b. 10/2022 NSTSEMI/Cath: LM nl, LAD 5p/m, RI 85, LCX 110m, OM1 mild dzs, OM3 100 fills via L->L collats from dLAD, RCA 80p, 154m, RPDA fills via collats from LAD. EF 45-50%-->Med Rx.   CAD, multiple vessel    Cardiac arrest - ventricular fibrillation    a. In setting of STEMI 01/2011   Chronic, continuous use of opioids    Cocaine abuse (HCC)    COPD (chronic obstructive pulmonary disease) (HCC)     Depression with anxiety    ETOH abuse    a. 6-12 beers/day   GERD (gastroesophageal reflux disease)    GI bleed    Hemorrhoids    Hyperlipidemia    Hypertension    Ischemic cardiomyopathy    a. 06/2011 Echo: EF 45-50%, No rwma; b. 10/2022 Echo: EF 55-60%, no rwma, nl RV fxn.   Liver cirrhosis, alcoholic (HCC)    Marijuana abuse    Migraines    Noncompliance with immunization regimen    Obesity (BMI 30.0-34.9)    Orthostatic hypertension    midodrine   Portal hypertension (HCC)    s/p TIPS   STEMI (ST elevation myocardial infarction) (HCC) 2012   Tobacco abuse    a. 1/2 ppd x 26 yrs    Assessment: Pt is a 53 yo male  presenting to ED c/o CP & SOB, found with elevated troponin I level, trending up. Hx:CAD, CHF, COPD, cocaine abuse, EtOH abuse, addisons, opioid use, GIB, ETOH liver cirrhosis Hx multiple ED visits for EtOH  Baseline labs:  Hgb 10.0  Plt 93  INR 1.5  (liver dx?)   aPTT 37  Goal of Therapy:  Heparin level 0.3-0.7 units/ml Monitor platelets by anticoagulation protocol: Yes   Plan: heparin level therapeutic x 1 ---Continue heparin infusion rate at 1200 units/hr ---Will check confirmatory heparin level in 6 hrs ---CBC daily while on heparin  Otelia Sergeant, PharmD, [facility] 01/09/2024 6:44 AM

## 2024-01-09 NOTE — Telephone Encounter (Signed)
Patient Product/process development scientist completed.    The patient is insured through [employer]. Patient has Medicare and is not eligible for a copay card, but may be able to apply for patient assistance or Medicare RX Payment Plan (Patient Must reach out to their plan, if eligible for payment plan), if available.    Ran test claim for Farxiga 10 mg and the current 30 day co-pay is $0.00.  Ran test claim for Jardiance 10 mg and the current 30 day co-pay is $0.00.  This test claim was processed through [facility]- copay amounts may vary at other pharmacies due to pharmacy/plan contracts, or as the patient moves through the different stages of their insurance plan.     Roland Earl, CPHT Pharmacy Technician III Certified Patient Advocate [facility] Patient Advocate Team Direct Number: [phone]  Fax: [phone]

## 2024-01-09 NOTE — Progress Notes (Signed)
ANTICOAGULATION CONSULT NOTE  Pharmacy Consult for heparin infusion Indication: ACS/STEMI  Allergies  Allergen Reactions   Penicillins Hives and Rash    Tolerated Ceftriaxone 10/2022   Patient Measurements: Height: 5\' 5"  (165.1 cm) Weight: 85.7 kg (188 lb 15 oz) IBW/kg (Calculated) : 61.5 Heparin Dosing Weight: 78.7 kg  Vital Signs: Temp: 98.1 F (36.7 C) (01/24 0800) Temp Source: Oral (01/24 0800) BP: 107/62 (01/24 0800) Pulse Rate: 76 (01/24 0800)  Labs: Recent Labs    01/07/24 2126 01/08/24 0103 01/08/24 0155 01/08/24 0747 01/08/24 1449 01/08/24 1955 01/09/24 0513 01/09/24 1139  HGB 10.0*  --   --   --  7.9*  --  8.3*  --   HCT 30.7*  --   --   --  23.5*  --  25.3*  --   PLT 93*  --   --   --   --   --  89*  --   APTT  --   --  37*  --   --   --   --   --   LABPROT  --   --  18.3*  --   --   --   --   --   INR  --   --  1.5*  --   --   --   --   --   HEPARINUNFRC  --    < >  --  0.39  --  0.28* 0.39 0.47  CREATININE 0.56*  --   --   --   --   --  0.68  --   CKTOTAL  --   --   --  79  --   --   --   --   TROPONINIHS 16   < >  --  293* 372*  --  230*  --    < > = values in this interval not displayed.   Estimated Creatinine Clearance: 110 mL/min (by C-G formula based on SCr of 0.68 mg/dL).  Medical History: Past Medical History:  Diagnosis Date   (HFpEF) heart failure with preserved ejection fraction (HCC)    Addison disease (HCC)    CAD (coronary artery disease)    a. 01/2011 Anterior STEMI/Cath/PCI: LM nl, LAD 100d (3.5x53mm Vision BMS placed), LCX 66m, RI 50, RCA min irregs, EF 40% w/ apical, inferoapical HK; b. 10/2022 NSTSEMI/Cath: LM nl, LAD 5p/m, RI 85, LCX 182m, OM1 mild dzs, OM3 100 fills via L->L collats from dLAD, RCA 80p, 191m, RPDA fills via collats from LAD. EF 45-50%-->Med Rx.   CAD, multiple vessel    Cardiac arrest - ventricular fibrillation    a. In setting of STEMI 01/2011   Chronic, continuous use of opioids    Cocaine abuse (HCC)    COPD  (chronic obstructive pulmonary disease) (HCC)    Depression with anxiety    ETOH abuse    a. 6-12 beers/day   GERD (gastroesophageal reflux disease)    GI bleed    Hemorrhoids    Hyperlipidemia    Hypertension    Ischemic cardiomyopathy    a. 06/2011 Echo: EF 45-50%, No rwma; b. 10/2022 Echo: EF 55-60%, no rwma, nl RV fxn.   Liver cirrhosis, alcoholic (HCC)    Marijuana abuse    Migraines    Noncompliance with immunization regimen    Obesity (BMI 30.0-34.9)    Orthostatic hypertension    midodrine   Portal hypertension (HCC)    s/p TIPS   STEMI (ST  elevation myocardial infarction) (HCC) 2012   Tobacco abuse    a. 1/2 ppd x 26 yrs   Assessment: Pt is a 53 yo male presenting to ED c/o CP & SOB, found with elevated troponin I level, trending up. PMH significant for CAD, CHF, COPD, cocaine abuse, EtOH abuse, Addisons, opioid use, GIB, and EtOH liver cirrhosis. Pharmacy has been consulted for initiation and management of heparin infusion. On last hospital admission, patient had similar NSTEMI and was treated with heparin drip however it had to be discontinued prematurely due to drop in H&H. This time, plan to continue heparin drip for at least 24-48 hours if tolerated.   Goal of Therapy:  Heparin level 0.3-0.7 units/ml Monitor platelets by anticoagulation protocol: Yes   Date           HL             Interpretation 1/23 0747   0.39           Therapeutic x 1; 1050 units/hr  1/23 1955   0.28           Subtherapeutic; increased to 1200 units/hr  1/24 0513   0.39           Therapeutic x 1; 1200 units/hr 1/24 1139   0.47           Therapeutic x 2; 1200 units/hr      Plan:  HL therapeutic x 2  Continue heparin infusion rate at 1200 units/hr Check next HL tomorrow morning with AM labs  Platelets trending down; likely a result of chronic alcohol abuse  Monitor CBC daily and for signs/symptoms of bleeding   Littie Deeds, PharmD Pharmacy Resident  01/09/2024 12:42 PM

## 2024-01-09 NOTE — Progress Notes (Signed)
  Progress Note   [name] MVH:[id number] DOB: 1971-01-27 DOA: 01/08/2024     1 DOS: the patient was seen and examined on 01/09/2024   Brief hospital course: Ethan Wells is a 53 y.o. male with medical history significant of alcohol abuse with alcoholic liver cirrhosis with portal hypertension status post TIPS, polysubstance abuse, cocaine abuse, orthostatic hypotension on chronic midodrine, SMA stenosis, HTN, HLD, CAD STEMI, chronic HFpEF, GERD, presented with chest pain.  Upon arriving the hospital, patient was intoxicated, alcohol level was 243, troponin 293.  Chest x-ray did not show acute changes. Elevated troponin appear to be secondary to cocaine, no additional workup is indicated per cardiology.   Principal Problem:   NSTEMI (non-ST elevated myocardial infarction) (HCC) Active Problems:   Cocaine abuse (HCC)   Alcohol abuse   COPD with acute exacerbation (HCC)   Assessment and Plan: Cocaine induced non-STEMI. Cocaine abuse. Per cardiology, no additional workup is indicated.  COPD exacerbation. Discussion with admitting physician, patient did have short of breath yesterday.  But condition had improved, patient is placed steroids.  Alcohol intoxication. Alcoholic hepatitis. Improved, watch for alcohol withdrawal.  Chronic diastolic congestive heart failure. Pulmonary hypertension. Patient currently does not have exacerbation of congestive heart failure, BNP level is moderately elevated, but chest x-ray did not show any congestion.  CT scan did show evidence of pulmonary hypertension. At this point, I will add torsemide 20 mg daily.  Chronic hypotension. Adrenal insufficiency. Patient recently had a 5 AM cortisol was 4.4, clinically, patient has adrenal insufficiency.  He is currently on prednisone for COPD exacerbation, blood pressure much better.  Planning to restart prednisone at a 2.5 mg twice a day at discharge.  Hypokalemia Metabolic  acidosis Given oral potassium and sodium bicarb.  Anemia and thrombocytopenia. Iron deficient anemia. Patient had chronic thrombocytopenia from alcohol, he also has some iron deficiency on recent iron study.  B12 level was normal.  Will start iron supplement.  Obesity with BMI 31.44.  Diet and excise.    Subjective:  Patient still has some short of breath with duration.  No cough.  Physical Exam: Vitals:   01/09/24 0342 01/09/24 0403 01/09/24 0442 01/09/24 0800  BP:  (!) 106/58 120/60 107/62  Pulse:  86 90 76  Resp:  17 19 20   Temp: (!) 97.2 F (36.2 C)  97.8 F (36.6 C) 98.1 F (36.7 C)  TempSrc: Oral  Oral Oral  SpO2:  96% 98% 97%  Weight:   85.7 kg   Height:   5\' 5"  (1.651 m)    General exam: Appears calm and comfortable  Respiratory system: Clear to auscultation. Respiratory effort normal. Cardiovascular system: S1 & S2 heard, RRR. No JVD, murmurs, rubs, gallops or clicks. No pedal edema. Gastrointestinal system: Abdomen is nondistended, soft and nontender. No organomegaly or masses felt. Normal bowel sounds heard. Central nervous system: Alert and oriented x2. No focal neurological deficits. Extremities: Symmetric 5 x 5 power. Skin: No rashes, lesions or ulcers Psychiatry: Judgement and insight appear normal. Mood & affect appropriate.    Data Reviewed:  Reviewed all lab results, previous results.  CT angiogram results.  Family Communication: None  Disposition: Status is: Inpatient Remains inpatient appropriate because: Severity of disease. Likely discharge home tomorrow.     Time spent: 50 minutes  Author: Marrion Coy, MD 01/09/2024 12:19 PM  For on call review [email or web address].

## 2024-01-09 NOTE — TOC Progression Note (Signed)
Transition of Care [facility]) - Progression Note    Patient Details  Name: Ethan Wells MRN: [id number] Date of Birth: 20-Jun-1971  Transition of Care [facility]) CM/SW Contact  Truddie Hidden, RN Phone Number: 01/09/2024, 12:24 PM  Clinical Narrative:    TOC continuing to follow patient's progress throughout discharge planning.        Expected Discharge Plan and Services                                               Social Determinants of Health (SDOH) Interventions SDOH Screenings   Food Insecurity: No Food Insecurity (01/08/2024)  Housing: Low Risk  (01/08/2024)  Transportation Needs: No Transportation Needs (01/08/2024)  Recent Concern: Transportation Needs - Unmet Transportation Needs (12/13/2023)  Utilities: Not At Risk (01/08/2024)  Alcohol Screen: Medium Risk (10/01/2022)  Financial Resource Strain: Low Risk  (12/15/2023)  Social Connections: Unknown (01/08/2024)  Tobacco Use: High Risk (01/07/2024)    Readmission Risk Interventions    01/08/2024    3:53 PM 12/15/2023    2:55 PM 10/16/2023   12:56 PM  Readmission Risk Prevention Plan  Transportation Screening Complete Complete Complete  Medication Review Oceanographer) Complete Complete Complete  PCP or Specialist appointment within 3-5 days of discharge Complete Complete Complete  HRI or Home Care Consult   Patient refused  SW Recovery Care/Counseling Consult Complete Complete Patient refused  Palliative Care Screening Not Applicable Not Applicable Patient Refused  Skilled Nursing Facility Not Applicable Not Applicable Patient Refused

## 2024-01-09 NOTE — Hospital Course (Addendum)
 Ethan Wells is a 53 y.o. male with medical history significant of alcohol abuse with alcoholic liver cirrhosis with portal hypertension status post TIPS, polysubstance abuse, cocaine abuse, orthostatic hypotension on chronic midodrine, SMA stenosis, HTN, HLD, CAD STEMI, chronic HFpEF, GERD, presented with chest pain.  Upon arriving the hospital, patient was intoxicated, alcohol level was 243, troponin 293.  Chest x-ray did not show acute changes. Elevated troponin appear to be secondary to cocaine, no additional workup is indicated per cardiology.  Patient is treated with heparin for 48 hours. Patient developed significant hypotension on 1/25, patient also has upper respite infection.  He was started on IV Solu-Cortef for adrenal crisis.  Patient was influenza positive.

## 2024-01-09 NOTE — ED Notes (Addendum)
CCMD called to put place patient on monitoring. Patient provided with ice chips and warm blankets at request.

## 2024-01-10 ENCOUNTER — Inpatient Hospital Stay: Payer: Medicare HMO

## 2024-01-10 DIAGNOSIS — I214 Non-ST elevation (NSTEMI) myocardial infarction: Secondary | ICD-10-CM | POA: Diagnosis not present

## 2024-01-10 DIAGNOSIS — I9589 Other hypotension: Secondary | ICD-10-CM

## 2024-01-10 DIAGNOSIS — F1010 Alcohol abuse, uncomplicated: Secondary | ICD-10-CM | POA: Diagnosis not present

## 2024-01-10 LAB — CBC
HCT: 30.5 % — ABNORMAL LOW (ref 39.0–52.0)
HCT: 27 % — ABNORMAL LOW (ref 39.0–52.0)
Hemoglobin: 10 g/dL — ABNORMAL LOW (ref 13.0–17.0)
Hemoglobin: 9.1 g/dL — ABNORMAL LOW (ref 13.0–17.0)
MCH: 30.1 pg (ref 26.0–34.0)
MCH: 30.3 pg (ref 26.0–34.0)
MCHC: 32.8 g/dL (ref 30.0–36.0)
MCHC: 33.7 g/dL (ref 30.0–36.0)
MCV: 91.9 fL (ref 80.0–100.0)
MCV: 90 fL (ref 80.0–100.0)
Platelets: 105 10*3/uL — ABNORMAL LOW (ref 150–400)
Platelets: 84 10*3/uL — ABNORMAL LOW (ref 150–400)
RBC: 3.32 MIL/uL — ABNORMAL LOW (ref 4.22–5.81)
RBC: 3 MIL/uL — ABNORMAL LOW (ref 4.22–5.81)
RDW: 16.5 % — ABNORMAL HIGH (ref 11.5–15.5)
RDW: 16.9 % — ABNORMAL HIGH (ref 11.5–15.5)
WBC: 8.3 10*3/uL (ref 4.0–10.5)
WBC: 6 10*3/uL (ref 4.0–10.5)
nRBC: 0 % (ref 0.0–0.2)
nRBC: 0 % (ref 0.0–0.2)

## 2024-01-10 LAB — BASIC METABOLIC PANEL
Anion gap: 11 (ref 5–15)
BUN: 19 mg/dL (ref 6–20)
CO2: 22 mmol/L (ref 22–32)
Calcium: 8.2 mg/dL — ABNORMAL LOW (ref 8.9–10.3)
Chloride: 98 mmol/L (ref 98–111)
Creatinine, Ser: 0.77 mg/dL (ref 0.61–1.24)
GFR, Estimated: >60 mL/min (ref >60)
Glucose, Bld: 128 mg/dL — ABNORMAL HIGH (ref 70–99)
Potassium: 3 mmol/L — ABNORMAL LOW (ref 3.5–5.1)
Sodium: 131 mmol/L — ABNORMAL LOW (ref 135–145)

## 2024-01-10 LAB — COMPREHENSIVE METABOLIC PANEL
ALT: 20 U/L (ref 0–44)
AST: 68 U/L — ABNORMAL HIGH (ref 15–41)
Albumin: 2.9 g/dL — ABNORMAL LOW (ref 3.5–5.0)
Alkaline Phosphatase: 132 U/L — ABNORMAL HIGH (ref 38–126)
Anion gap: 10 (ref 5–15)
BUN: 18 mg/dL (ref 6–20)
CO2: 22 mmol/L (ref 22–32)
Calcium: 7.7 mg/dL — ABNORMAL LOW (ref 8.9–10.3)
Chloride: 103 mmol/L (ref 98–111)
Creatinine, Ser: 0.89 mg/dL (ref 0.61–1.24)
GFR, Estimated: >60 mL/min (ref >60)
Glucose, Bld: 160 mg/dL — ABNORMAL HIGH (ref 70–99)
Potassium: 4 mmol/L (ref 3.5–5.1)
Sodium: 135 mmol/L (ref 135–145)
Total Bilirubin: 4.3 mg/dL — ABNORMAL HIGH (ref 0.0–1.2)
Total Protein: 6.8 g/dL (ref 6.5–8.1)

## 2024-01-10 LAB — TROPONIN I (HIGH SENSITIVITY): Troponin I (High Sensitivity): 101 ng/L (ref <18)

## 2024-01-10 LAB — LACTIC ACID, PLASMA: Lactic Acid, Venous: 1.4 mmol/L (ref 0.5–1.9)

## 2024-01-10 LAB — PHOSPHORUS: Phosphorus: 1.3 mg/dL — ABNORMAL LOW (ref 2.5–4.6)

## 2024-01-10 LAB — MAGNESIUM: Magnesium: 1.8 mg/dL (ref 1.7–2.4)

## 2024-01-10 MED ORDER — MIDODRINE HCL 5 MG PO TABS
5.0000 mg | ORAL_TABLET | Freq: Three times a day (TID) | ORAL | Status: DC
  Administered 2024-01-10 – 2024-01-14 (×12): 5 mg via ORAL
  Filled 2024-01-10 (×12): qty 1

## 2024-01-10 MED ORDER — POTASSIUM CHLORIDE CRYS ER 20 MEQ PO TBCR
40.0000 meq | EXTENDED_RELEASE_TABLET | ORAL | Status: AC
  Administered 2024-01-10 (×2): 40 meq via ORAL
  Filled 2024-01-10 (×2): qty 2

## 2024-01-10 MED ORDER — POTASSIUM PHOSPHATES 15 MMOLE/5ML IV SOLN
30.0000 mmol | Freq: Once | INTRAVENOUS | Status: AC
  Administered 2024-01-10: 30 mmol via INTRAVENOUS
  Filled 2024-01-10: qty 10

## 2024-01-10 MED ORDER — POTASSIUM PHOSPHATES 15 MMOLE/5ML IV SOLN
30.0000 mmol | Freq: Once | INTRAVENOUS | Status: DC
  Filled 2024-01-10: qty 10

## 2024-01-10 MED ORDER — HYDROCORTISONE SOD SUC (PF) 100 MG IJ SOLR
100.0000 mg | Freq: Three times a day (TID) | INTRAMUSCULAR | Status: DC
  Administered 2024-01-10 – 2024-01-13 (×9): 100 mg via INTRAVENOUS
  Filled 2024-01-10 (×11): qty 2

## 2024-01-10 MED ORDER — SODIUM CHLORIDE 0.9 % IV BOLUS
500.0000 mL | Freq: Once | INTRAVENOUS | Status: AC
  Administered 2024-01-10: 500 mL via INTRAVENOUS

## 2024-01-10 MED ORDER — LORAZEPAM 2 MG/ML IJ SOLN
2.0000 mg | Freq: Once | INTRAMUSCULAR | Status: AC
  Administered 2024-01-10: 2 mg via INTRAVENOUS
  Filled 2024-01-10: qty 1

## 2024-01-10 MED ORDER — SODIUM CHLORIDE 0.9 % IV BOLUS
1000.0000 mL | Freq: Once | INTRAVENOUS | Status: AC
  Administered 2024-01-10: 1000 mL via INTRAVENOUS

## 2024-01-10 NOTE — Plan of Care (Signed)
Pt vomited and developed hypotension with multiple blood pressure readings in 80s/40s after repositioning the cuff and switching arms. Notified Zhang, MD, fluid bolus & midodrine ordered. Pt remained hypotensive so another bolus was ordered as well as a range of labs. Pt's LOC unchanged, will continue to monitor pt closely

## 2024-01-10 NOTE — Progress Notes (Addendum)
Patient vomited once, then developed significant hypotension.  Received 500 mL normal saline bolus, blood pressure still low. Currently patient has some shortness of breath, no vomiting.  Examination showed some rhonchi in the bases.  Heart regular no murmurs.  Abdomen soft no tenderness.  Patient appeared jaundiced.  Patient will be given 1 L of normal saline bolus now.  Etiology of hypotension still unclear, patient already receiving steroids orally.  He has adrenal sufficiency, I will change that to IV hydrocortisone 100 mg IV every 8 hours. Another consideration is aspiration.  Obtain chest x-ray, check a procalcitonin level.  Obtain blood cultures.  I reviewed chest x-ray, may consider starting antibiotics with Unasyn.  Also check lactic acid level. Another consideration is a cardiac origin, recheck troponin level.  Patient condition is critical, will follow closely in the progressive unit.  If blood pressure does not go up after bolus, will consider transfer to ICU for pressors.   CRITICAL CARE Performed by: Marrion Coy   Total critical care time: 50 minutes  Critical care time was exclusive of separately billable procedures and treating other patients.  Critical care was necessary to treat or prevent imminent or life-threatening deterioration.  Critical care was time spent personally by me on the following activities: development of treatment plan with patient and/or surrogate as well as nursing, discussions with consultants, evaluation of patient's response to treatment, examination of patient, obtaining history from patient or surrogate, ordering and performing treatments and interventions, ordering and review of laboratory studies, ordering and review of radiographic studies, pulse oximetry and re-evaluation of patient's condition.

## 2024-01-10 NOTE — Plan of Care (Signed)
Problem: Clinical Measurements: Goal: Cardiovascular complication will be avoided Outcome: Progressing   Problem: Activity: Goal: Risk for activity intolerance will decrease Outcome: Progressing   Problem: Coping: Goal: Level of anxiety will decrease Outcome: Progressing   Problem: Safety: Goal: Ability to remain free from injury will improve Outcome: Progressing

## 2024-01-10 NOTE — Progress Notes (Signed)
Progress Note   [name] [id number]:[id number] DOB: 02/23/1971 DOA: 01/08/2024     2 DOS: the patient was seen and examined on 01/10/2024   Brief hospital course: YAN PANKRATZ is a 53 y.o. male with medical history significant of alcohol abuse with alcoholic liver cirrhosis with portal hypertension status post TIPS, polysubstance abuse, cocaine abuse, orthostatic hypotension on chronic midodrine, SMA stenosis, HTN, HLD, CAD STEMI, chronic HFpEF, GERD, presented with chest pain.  Upon arriving the hospital, patient was intoxicated, alcohol level was 243, troponin 293.  Chest x-ray did not show acute changes. Elevated troponin appear to be secondary to cocaine, no additional workup is indicated per cardiology.  Patient is treated with heparin for 48 hours.   Principal Problem:   NSTEMI (non-ST elevated myocardial infarction) (HCC) Active Problems:   Hypokalemia   Cocaine abuse (HCC)   Alcohol abuse   Hypophosphatemia   COPD with acute exacerbation (HCC)   Assessment and Plan:  Cocaine induced non-STEMI. Cocaine abuse. Per cardiology, no additional workup is indicated.  Patient has completed 48 hours of heparin.   COPD exacerbation. Discussion with admitting physician, patient did have short of breath yesterday.  But condition had improved, patient is placed steroids. Condition much improved, continue oral steroids.   Alcohol intoxication. Alcoholic hepatitis. Improved, watch for alcohol withdrawal.   Chronic diastolic congestive heart failure. Pulmonary hypertension. Patient currently does not have exacerbation of congestive heart failure, BNP level is moderately elevated, but chest x-ray did not show any congestion.  CT scan did show evidence of pulmonary hypertension. At this point, I will add torsemide 20 mg daily.   Chronic hypotension. Adrenal insufficiency. Patient recently had a 5 AM cortisol was 4.4, clinically, patient has adrenal insufficiency.  He  is currently on prednisone for COPD exacerbation, blood pressure much better.  Planning to restart prednisone at a 2.5 mg twice a day at discharge.   Hypokalemia Metabolic acidosis Hypophosphatemia. Hyponatremia. Continue replete potassium, phosphate 1.3, give  30 mmol of potassium phosphate.   Anemia and thrombocytopenia. Iron deficient anemia. Patient had chronic thrombocytopenia from alcohol, he also has some iron deficiency on recent iron study.  B12 level was normal.  Will start iron supplement.   Obesity with BMI 31.44.  Diet and excise.      Subjective:  Patient has some short of breath otherwise doing well.  Physical Exam: Vitals:   01/10/24 0231 01/10/24 0410 01/10/24 0902 01/10/24 0909  BP: 120/77 (!) 110/55 118/72   Pulse: 71 66 86 86  Resp: 18 18  14   Temp: 98 F (36.7 C) 97.6 F (36.4 C) 97.7 F (36.5 C)   TempSrc: Oral Oral Oral   SpO2: 98% 100% 96% 96%  Weight:      Height:       General exam: Appears calm and comfortable  Respiratory system: Clear to auscultation. Respiratory effort normal. Cardiovascular system: S1 & S2 heard, RRR. No JVD, murmurs, rubs, gallops or clicks. No pedal edema. Gastrointestinal system: Abdomen is nondistended, soft and nontender. No organomegaly or masses felt. Normal bowel sounds heard. Central nervous system: Alert and oriented. No focal neurological deficits. Extremities: Symmetric 5 x 5 power. Skin: No rashes, lesions or ulcers Psychiatry: Flat affect.   Data Reviewed:  There are no new results to review at this time.  Family Communication: Lab results reviewed.  Disposition: Status is: Inpatient Remains inpatient appropriate because: Severity of disease.     Time spent: 35 minutes  Author: Marrion Coy, MD  01/10/2024 1:18 PM  For on call review [email or web address].

## 2024-01-10 NOTE — Plan of Care (Signed)
  Problem: Clinical Measurements: Goal: Cardiovascular complication will be avoided Outcome: Not Progressing   Pt had episode of hypotension

## 2024-01-11 DIAGNOSIS — E272 Addisonian crisis: Secondary | ICD-10-CM

## 2024-01-11 DIAGNOSIS — J441 Chronic obstructive pulmonary disease with (acute) exacerbation: Secondary | ICD-10-CM | POA: Diagnosis not present

## 2024-01-11 DIAGNOSIS — I214 Non-ST elevation (NSTEMI) myocardial infarction: Secondary | ICD-10-CM | POA: Diagnosis not present

## 2024-01-11 DIAGNOSIS — F1410 Cocaine abuse, uncomplicated: Secondary | ICD-10-CM | POA: Diagnosis not present

## 2024-01-11 LAB — PROCALCITONIN: Procalcitonin: 0.18 ng/mL

## 2024-01-11 LAB — PHOSPHORUS: Phosphorus: 3.2 mg/dL (ref 2.5–4.6)

## 2024-01-11 LAB — RESPIRATORY PANEL BY PCR

## 2024-01-11 LAB — BASIC METABOLIC PANEL
Anion gap: 10 (ref 5–15)
BUN: 19 mg/dL (ref 6–20)
CO2: 22 mmol/L (ref 22–32)
Calcium: 7.6 mg/dL — ABNORMAL LOW (ref 8.9–10.3)
Chloride: 103 mmol/L (ref 98–111)
Creatinine, Ser: 0.77 mg/dL (ref 0.61–1.24)
GFR, Estimated: >60 mL/min (ref >60)
Glucose, Bld: 154 mg/dL — ABNORMAL HIGH (ref 70–99)
Potassium: 3.6 mmol/L (ref 3.5–5.1)
Sodium: 135 mmol/L (ref 135–145)

## 2024-01-11 LAB — MAGNESIUM: Magnesium: 1.4 mg/dL — ABNORMAL LOW (ref 1.7–2.4)

## 2024-01-11 MED ORDER — MAGNESIUM SULFATE 4 GM/100ML IV SOLN
4.0000 g | Freq: Once | INTRAVENOUS | Status: AC
  Administered 2024-01-11: 4 g via INTRAVENOUS
  Filled 2024-01-11: qty 100

## 2024-01-11 MED ORDER — ALBUTEROL SULFATE (2.5 MG/3ML) 0.083% IN NEBU
3.0000 mL | INHALATION_SOLUTION | Freq: Four times a day (QID) | RESPIRATORY_TRACT | Status: DC
  Administered 2024-01-11 – 2024-01-12 (×3): 3 mL via RESPIRATORY_TRACT
  Filled 2024-01-11 (×2): qty 3

## 2024-01-11 NOTE — Plan of Care (Signed)

## 2024-01-11 NOTE — Plan of Care (Signed)

## 2024-01-11 NOTE — Progress Notes (Signed)
Progress Note   [name] ZOX:[id number] DOB: [birth year] DOA: 01/08/2024     3 DOS: the patient was seen and examined on 01/11/2024   Brief hospital course: RIGLEY NIESS is a 53 y.o. male with medical history significant of alcohol abuse with alcoholic liver cirrhosis with portal hypertension status post TIPS, polysubstance abuse, cocaine abuse, orthostatic hypotension on chronic midodrine, SMA stenosis, HTN, HLD, CAD STEMI, chronic HFpEF, GERD, presented with chest pain.  Upon arriving the hospital, patient was intoxicated, alcohol level was 243, troponin 293.  Chest x-ray did not show acute changes. Elevated troponin appear to be secondary to cocaine, no additional workup is indicated per cardiology.  Patient is treated with heparin for 48 hours. Patient developed significant hypotension on 1/25, patient also has upper respite infection.  He was started on IV Solu-Cortef for adrenal crisis.   Principal Problem:   NSTEMI (non-ST elevated myocardial infarction) (HCC) Active Problems:   Adrenal crisis (HCC)   Hypokalemia   Cocaine abuse (HCC)   Alcohol abuse   Hypotension   Hypophosphatemia   COPD with acute exacerbation (HCC)   Assessment and Plan: Adrenal crisis with severe hypotension. Upper respiratory infection. Chronic hypotension. Patient developed severe hypotension on 1/25, received 1.5 L fluid bolus.  Patient also developed upper respite symptoms, adrenal crisis was due to upper respite infection.  Patient is treated with Solu-Cortef 100 mg IV every 8 hours, blood pressure much better today.  Procalcitonin level not significant elevated, no bacterial infection.  Check upper respite pathogen panel. Patient has some cough, wheezing.  Started as needed albuterol.  Cocaine induced non-STEMI. Cocaine abuse. Per cardiology, no additional workup is indicated.  Patient has completed 48 hours of heparin.   COPD exacerbation. Discussion with admitting  physician, patient did have short of breath yesterday.  But condition had improved, patient is placed steroids. Currently on Solu-Cortef.   Alcohol intoxication. Alcoholic hepatitis with jaundice. Improved, no evidence of alcohol withdrawal.   Chronic diastolic congestive heart failure. Pulmonary hypertension. Patient currently does not have exacerbation of congestive heart failure, BNP level is moderately elevated, but chest x-ray did not show any congestion.  CT scan did show evidence of pulmonary hypertension. Patient does not have exacerbation congestive heart failure.  Hypokalemia Metabolic acidosis Hypophosphatemia. Hyponatremia. Hypomagnesemia. Replete the magnesium today.   Anemia and thrombocytopenia. Iron deficient anemia. Patient had chronic thrombocytopenia from alcohol, he also has some iron deficiency on recent iron study.  B12 level was normal.  Started on iron supplement.  Continue to follow.   Obesity with BMI 31.44.  Diet and excise.          Subjective:  Patient doing much better today, no chest pain shortness of breath.  Physical Exam: Vitals:   01/11/24 0822 01/11/24 0856 01/11/24 1225 01/11/24 1226  BP: (!) 103/54  (!) 90/50   Pulse: 81 83 87 89  Resp: 18 20 18    Temp: 98.6 F (37 C)  98 F (36.7 C)   TempSrc:      SpO2: 96% 97% (!) 89% 92%  Weight:      Height:       General exam: Appears calm and comfortable  Respiratory system: Clear to auscultation. Respiratory effort normal. Cardiovascular system: S1 & S2 heard, RRR. No JVD, murmurs, rubs, gallops or clicks. No pedal edema. Gastrointestinal system: Abdomen is nondistended, soft and nontender. No organomegaly or masses felt. Normal bowel sounds heard. Central nervous system: Alert and oriented x3. No focal neurological deficits.  Extremities: Symmetric 5 x 5 power. Skin: No rashes, lesions or ulcers Psychiatry: Judgement and insight appear normal. Mood & affect appropriate.    Data  Reviewed:  Reviewed chest x-ray results, lab results.  Family Communication: None  Disposition: Status is: Inpatient Remains inpatient appropriate because: Severity of disease, IV treatment.     Time spent: 35 minutes  Author: Marrion Coy, MD 01/11/2024 1:12 PM  For on call review [email or web address].

## 2024-01-11 NOTE — Progress Notes (Signed)
Pt refused 1200 nebulizer tx, stated he just wanted to sleep

## 2024-01-12 DIAGNOSIS — I214 Non-ST elevation (NSTEMI) myocardial infarction: Secondary | ICD-10-CM | POA: Diagnosis not present

## 2024-01-12 DIAGNOSIS — J441 Chronic obstructive pulmonary disease with (acute) exacerbation: Secondary | ICD-10-CM | POA: Diagnosis not present

## 2024-01-12 DIAGNOSIS — J101 Influenza due to other identified influenza virus with other respiratory manifestations: Secondary | ICD-10-CM | POA: Insufficient documentation

## 2024-01-12 DIAGNOSIS — E272 Addisonian crisis: Secondary | ICD-10-CM | POA: Diagnosis not present

## 2024-01-12 LAB — BASIC METABOLIC PANEL
Anion gap: 7 (ref 5–15)
BUN: 15 mg/dL (ref 6–20)
CO2: 23 mmol/L (ref 22–32)
Calcium: 7.9 mg/dL — ABNORMAL LOW (ref 8.9–10.3)
Chloride: 103 mmol/L (ref 98–111)
Creatinine, Ser: 0.58 mg/dL — ABNORMAL LOW (ref 0.61–1.24)
GFR, Estimated: >60 mL/min (ref >60)
Glucose, Bld: 134 mg/dL — ABNORMAL HIGH (ref 70–99)
Potassium: 3.4 mmol/L — ABNORMAL LOW (ref 3.5–5.1)
Sodium: 133 mmol/L — ABNORMAL LOW (ref 135–145)

## 2024-01-12 LAB — CBC
HCT: 26.4 % — ABNORMAL LOW (ref 39.0–52.0)
Hemoglobin: 8.7 g/dL — ABNORMAL LOW (ref 13.0–17.0)
MCH: 30 pg (ref 26.0–34.0)
MCHC: 33 g/dL (ref 30.0–36.0)
MCV: 91 fL (ref 80.0–100.0)
Platelets: 77 10*3/uL — ABNORMAL LOW (ref 150–400)
RBC: 2.9 MIL/uL — ABNORMAL LOW (ref 4.22–5.81)
RDW: 17.2 % — ABNORMAL HIGH (ref 11.5–15.5)
WBC: 4.4 10*3/uL (ref 4.0–10.5)
nRBC: 0 % (ref 0.0–0.2)

## 2024-01-12 LAB — PROCALCITONIN: Procalcitonin: 0.12 ng/mL

## 2024-01-12 LAB — PHOSPHORUS: Phosphorus: 2.6 mg/dL (ref 2.5–4.6)

## 2024-01-12 LAB — MAGNESIUM: Magnesium: 2.4 mg/dL (ref 1.7–2.4)

## 2024-01-12 MED ORDER — ALBUTEROL SULFATE (2.5 MG/3ML) 0.083% IN NEBU: 3.0000 mL | INHALATION_SOLUTION | RESPIRATORY_TRACT | Status: DC | PRN

## 2024-01-12 MED ORDER — POTASSIUM CHLORIDE CRYS ER 20 MEQ PO TBCR
40.0000 meq | EXTENDED_RELEASE_TABLET | ORAL | Status: AC
  Administered 2024-01-12 (×2): 40 meq via ORAL
  Filled 2024-01-12 (×2): qty 2

## 2024-01-12 NOTE — Progress Notes (Signed)
1/27 Pt under Droplet Precautions, IMM Letter given to RN assigned to patient and was given at bedside.

## 2024-01-12 NOTE — Progress Notes (Signed)
Physical Therapy Evaluation Patient Details Name: KAYLEB WARSHAW MRN: [id number] DOB: Apr 16, 1971 Today's Date: 01/12/2024  History of Present Illness  Pt is a 53 y.o. male with medical history significant of liver cirrhosis s/p TIPS, superior mesenteric artery stenosis s/p stenting, CAD, STEMI s/p PCI with stenting, HFpEF, ischemic cardiomyopathy, prior V-fib cardiac arrest, hypertension, hyperlipidemia, GERD, migraine headaches polysubstance abuse (cocaine, alcohol, marijuana and tobacco), orthostatic hypotension, Addison's/adrenal insufficiency, chronic narcotic dependence, and recurrent GI bleeds. He has had 2 ED visits in 24 hours. He re-presents to the emergency department 10/31 morning with shortness of breath. He is also reporting midepigastric pain that radiates to his back.   Clinical Impression  Patient supine in bed upon arrival, agreeable to PT evaluation. Prior to admission, patient reports that he lived in single level home with ramped entrance. Patient lives with mother, another family member helps assist for care for mother. Patient reports intermittent use of SPC, does have fall history in past 6 months.   On evaluation, patient able to complete bed mobility supervision for safety, no physical assist required, increased time due to pain but no physical assist required. Patient able to stand from EOB with use of RW and transfer from bed > recliner with CGA. Patient with imbalance, therefore not attempted without AD this date. Patient refused additional trial of ambulation due to pain. Patient left in recliner with chair alarm set, and all needs in reach. Patient will benefit from skilled acute PT services to address functional impairments (see below for additional) and maximize functional mobility. Anticipate the need for follow up PT services upon acute hospital discharge. Will continue to follow acutely.         If plan is discharge home, recommend the following: Help with  stairs or ramp for entrance;Assist for transportation   Can travel by private vehicle        Equipment Recommendations Rolling walker (2 wheels)  Recommendations for Other Services       Functional Status Assessment Patient has had a recent decline in their functional status and demonstrates the ability to make significant improvements in function in a reasonable and predictable amount of time.     Precautions / Restrictions Precautions Precautions: None Restrictions Weight Bearing Restrictions Per Provider Order: No      Mobility  Bed Mobility Overal bed mobility: Needs Assistance             General bed mobility comments: supervision for safety; able to complete without assistance. good seated balance EOB    Transfers Overall transfer level: Needs assistance Equipment used: Standard walker Transfers: Sit to/from Stand, Bed to chair/wheelchair/BSC Sit to Stand: Contact guard assist   Step pivot transfers: Contact guard assist       General transfer comment: patient able to stand from EOB with CGA, mild imbalance.    Ambulation/Gait               General Gait Details: able to take some anterior/posterior steps to <> from recliner; patient refused additional ambulation distance due to pain.  Stairs            Wheelchair Mobility     Tilt Bed    Modified Rankin (Stroke Patients Only)       Balance Overall balance assessment: Needs assistance Sitting-balance support: Feet supported, No upper extremity supported Sitting balance-Leahy Scale: Normal     Standing balance support: During functional activity, Bilateral upper extremity supported Standing balance-Leahy Scale: Fair Standing balance comment: RW use; mild unsteadines  noted                             Pertinent Vitals/Pain Pain Assessment Pain Assessment: 0-10 Pain Score: 9  Pain Location: chest Pain Intervention(s): Monitored during session    Home Living  Family/patient expects to be discharged to:: Private residence Living Arrangements: Parent (Mom) Available Help at Discharge: Family (has cousin assist) Type of Home: House Home Access: Ramped entrance       Home Layout: One level Home Equipment: Rollator (4 wheels);Cane - single point      Prior Function Prior Level of Function : History of Falls (last six months);Driving;Independent/Modified Independent             Mobility Comments: Reports use of SPC; 2 falls reported ADLs Comments: Independent, ADLs, cooking driving. Pt. reports that he is the caregiver for his mother, and cooks her meals.     Extremity/Trunk Assessment   Upper Extremity Assessment Upper Extremity Assessment: Defer to OT evaluation    Lower Extremity Assessment Lower Extremity Assessment: Overall WFL for tasks assessed       Communication   Communication Communication: Other (comment) (very mumbled speech; appears to be baseline)  Cognition Arousal: Alert Behavior During Therapy: WFL for tasks assessed/performed Overall Cognitive Status: Within Functional Limits for tasks assessed                                          General Comments      Exercises     Assessment/Plan    PT Assessment Patient needs continued PT services  PT Problem List Decreased activity tolerance;Decreased balance;Decreased mobility;Pain       PT Treatment Interventions DME instruction;Gait training;Stair training;Functional mobility training;Therapeutic activities;Therapeutic exercise;Balance training;Neuromuscular re-education    PT Goals (Current goals can be found in the Care Plan section)  Acute Rehab PT Goals Patient Stated Goal: Get Out of Here PT Goal Formulation: With patient Time For Goal Achievement: 01/26/24 Potential to Achieve Goals: Good    Frequency Min 1X/week     Co-evaluation   Reason for Co-Treatment: For patient/therapist safety PT goals addressed during session:  Mobility/safety with mobility OT goals addressed during session: ADL's and self-care       AM-PAC PT "6 Clicks" Mobility  Outcome Measure Help needed turning from your back to your side while in a flat bed without using bedrails?: None Help needed moving from lying on your back to sitting on the side of a flat bed without using bedrails?: None Help needed moving to and from a bed to a chair (including a wheelchair)?: None Help needed standing up from a chair using your arms (e.g., wheelchair or bedside chair)?: A Little Help needed to walk in hospital room?: A Little Help needed climbing 3-5 steps with a railing? : A Little 6 Click Score: 21    End of Session Equipment Utilized During Treatment: Gait belt Activity Tolerance: Patient tolerated treatment well Patient left: in chair;with call bell/phone within reach;with chair alarm set Nurse Communication: Mobility status PT Visit Diagnosis: Unsteadiness on feet (R26.81);History of falling (Z91.81);Other abnormalities of gait and mobility (R26.89)    Time: 4132-4401 PT Time Calculation (min) (ACUTE ONLY): 15 min   Charges:   PT Evaluation $PT Eval Low Complexity: 1 Low   PT General Charges $$ ACUTE PT VISIT: 1 Visit  Howie Ill, PT, DPT 01/12/24 12:39 PM

## 2024-01-12 NOTE — Evaluation (Signed)
Occupational Therapy Evaluation Patient Details Name: Ethan Wells MRN: [id number] DOB: 18-Apr-1971 Today's Date: 01/12/2024   History of Present Illness Pt is a 53 y.o. male with medical history significant of liver cirrhosis s/p TIPS, superior mesenteric artery stenosis s/p stenting, CAD, STEMI s/p PCI with stenting, HFpEF, ischemic cardiomyopathy, prior V-fib cardiac arrest, hypertension, hyperlipidemia, GERD, migraine headaches polysubstance abuse (cocaine, alcohol, marijuana and tobacco), orthostatic hypotension, Addison's/adrenal insufficiency, chronic narcotic dependence, and recurrent GI bleeds. He has had 2 ED visits in 24 hours. He re-presents to the emergency department 10/31 morning with shortness of breath. He is also reporting midepigastric pain that radiates to his back.   Clinical Impression   Pt was seen for PT/OT co-evaluation this date to maximize pt/therapist safety. Prior to hospital admission, pt was living with his mom and assisting in her care. He was IND, driving and community mobile using SPC.   Pt presents to acute OT demonstrating impaired ADL performance and functional mobility 2/2 pain, weakness, balance deficits and low activity tolerance (See OT problem list for additional functional deficits). Pt currently requires SUP with bed mobility and LB dressing at bed level. STS from EOB to RW and SPT to recliner using RW with CGA and mild imbalance noted. Pt declining any further mobility or activity and stating he would not be sitting up long. He washed his face with set up seated in recliner. Chest pain reported 9/10, however no grimacing noted.  Pt would benefit from skilled OT services to address noted impairments and functional limitations (see below for any additional details) in order to maximize safety and independence while minimizing falls risk and caregiver burden. Pt may benefit from [facility] consult on DC to ensure safety and home modifications for ADL performance  and transfers.         If plan is discharge home, recommend the following: A little help with walking and/or transfers;A little help with bathing/dressing/bathroom;Assistance with cooking/housework;Assist for transportation;Help with stairs or ramp for entrance    Functional Status Assessment  Patient has had a recent decline in their functional status and demonstrates the ability to make significant improvements in function in a reasonable and predictable amount of time.  Equipment Recommendations  None recommended by OT    Recommendations for Other Services       Precautions / Restrictions Precautions Precautions: Fall Restrictions Weight Bearing Restrictions Per Provider Order: No      Mobility Bed Mobility Overal bed mobility: Needs Assistance Bed Mobility: Supine to Sit     Supine to sit: Supervision     General bed mobility comments: SUP, no LOB seated    Transfers Overall transfer level: Needs assistance Equipment used: Rolling walker (2 wheels) Transfers: Sit to/from Stand, Bed to chair/wheelchair/BSC Sit to Stand: Contact guard assist     Step pivot transfers: Contact guard assist     General transfer comment: CGA for STS EOB and SPT to recliner using RW and mild imbalance      Balance Overall balance assessment: Needs assistance Sitting-balance support: Feet supported, No upper extremity supported Sitting balance-Leahy Scale: Normal     Standing balance support: During functional activity, Bilateral upper extremity supported Standing balance-Leahy Scale: Fair Standing balance comment: RW use; mild unsteadines noted                           ADL either performed or assessed with clinical judgement   ADL Overall ADL's : Needs assistance/impaired  Lower Body Dressing: Supervision/safety;Bed level Lower Body Dressing Details (indicate cue type and reason): to don socks and mesh panties Toilet Transfer:  Contact guard assist;Rolling walker (2 wheels) Toilet Transfer Details (indicate cue type and reason): simulated to recliner with CGA and RW use           General ADL Comments: donned socks and underwear with no issues on eval     Vision         Perception         Praxis         Pertinent Vitals/Pain Pain Assessment Pain Assessment: 0-10 Pain Score: 9  Pain Location: chest Pain Intervention(s): Monitored during session, Premedicated before session, Repositioned     Extremity/Trunk Assessment Upper Extremity Assessment Upper Extremity Assessment: Overall WFL for tasks assessed   Lower Extremity Assessment Lower Extremity Assessment: Overall WFL for tasks assessed       Communication Communication Communication:  (mumbled speech that appears to be baseline)   Cognition Arousal: Alert Behavior During Therapy: WFL for tasks assessed/performed Overall Cognitive Status: Within Functional Limits for tasks assessed                                       General Comments       Exercises Other Exercises Other Exercises: Edu on role of OT in acute setting and importance of therapy to maximize strength/ind/safety.   Shoulder Instructions      Home Living Family/patient expects to be discharged to:: Private residence Living Arrangements: Parent (Mom) Available Help at Discharge: Family (has cousin assist) Type of Home: House Home Access: Ramped entrance     Home Layout: One level     Bathroom Shower/Tub: Tub/shower unit;Sponge bathes at baseline   [employer]: Standard     Home Equipment: Rollator (4 wheels);Cane - single point          Prior Functioning/Environment Prior Level of Function : History of Falls (last six months);Driving;Independent/Modified Independent             Mobility Comments: Reports use of SPC; 2 falls reported ADLs Comments: Independent, ADLs, cooking driving. Pt. reports that he is the caregiver for his  mother, and cooks her meals.        OT Problem List: Decreased strength;Impaired balance (sitting and/or standing)      OT Treatment/Interventions: Self-care/ADL training;Therapeutic exercise;Therapeutic activities;DME and/or AE instruction;Patient/family education;Balance training    OT Goals(Current goals can be found in the care plan section) Acute Rehab OT Goals Patient Stated Goal: improve strength OT Goal Formulation: With patient Time For Goal Achievement: 01/26/24 Potential to Achieve Goals: Good ADL Goals Pt Will Perform Lower Body Bathing: Independently;with modified independence;sitting/lateral leans;sit to/from stand Pt Will Perform Lower Body Dressing: Independently;with modified independence;sit to/from stand;sitting/lateral leans Pt Will Transfer to Toilet: with modified independence;ambulating Pt Will Perform Toileting - Clothing Manipulation and hygiene: with modified independence;sitting/lateral leans;sit to/from stand  OT Frequency: Min 1X/week    Co-evaluation PT/OT/SLP Co-Evaluation/Treatment: Yes Reason for Co-Treatment: For patient/therapist safety PT goals addressed during session: Mobility/safety with mobility OT goals addressed during session: ADL's and self-care      AM-PAC OT "6 Clicks" Daily Activity     Outcome Measure Help from another person eating meals?: None Help from another person taking care of personal grooming?: None Help from another person toileting, which includes using toliet, bedpan, or urinal?: A Little Help from another person  bathing (including washing, rinsing, drying)?: A Little Help from another person to put on and taking off regular upper body clothing?: None Help from another person to put on and taking off regular lower body clothing?: A Little 6 Click Score: 21   End of Session Equipment Utilized During Treatment: Rolling walker (2 wheels) Nurse Communication: Mobility status  Activity Tolerance: Patient tolerated  treatment well Patient left: in chair;with call bell/phone within reach;with chair alarm set  OT Visit Diagnosis: Other abnormalities of gait and mobility (R26.89);Unsteadiness on feet (R26.81)                Time: 8295-6213 OT Time Calculation (min): 15 min Charges:  OT General Charges $OT Visit: 1 Visit OT Evaluation $OT Eval Low Complexity: 1 Low Abir Craine, OTR/L 01/12/24, 12:55 PM  Ellanore Vanhook E Eathel Pajak 01/12/2024, 12:52 PM

## 2024-01-12 NOTE — Progress Notes (Signed)
Pt started the morning off first thing telling me oxycodone doesn't work well enough for his pain and he wants IV MS. Told him I would let the doctor know. Pt does not appear to be in pain and has slightly slurred sleepy speech. Had to ask him to repeat himself a few times. Did neuro check on him and he was just fine neurologically.

## 2024-01-12 NOTE — Progress Notes (Signed)
Progress Note   [name] BJY:[id number] DOB: 03-11-1971 DOA: 01/08/2024     4 DOS: the patient was seen and examined on 01/12/2024   Brief hospital course: Ethan Wells is a 53 y.o. male with medical history significant of alcohol abuse with alcoholic liver cirrhosis with portal hypertension status post TIPS, polysubstance abuse, cocaine abuse, orthostatic hypotension on chronic midodrine, SMA stenosis, HTN, HLD, CAD STEMI, chronic HFpEF, GERD, presented with chest pain.  Upon arriving the hospital, patient was intoxicated, alcohol level was 243, troponin 293.  Chest x-ray did not show acute changes. Elevated troponin appear to be secondary to cocaine, no additional workup is indicated per cardiology.  Patient is treated with heparin for 48 hours. Patient developed significant hypotension on 1/25, patient also has upper respite infection.  He was started on IV Solu-Cortef for adrenal crisis.  Patient was influenza positive.   Principal Problem:   NSTEMI (non-ST elevated myocardial infarction) (HCC) Active Problems:   Adrenal crisis (HCC)   Hypokalemia   Cocaine abuse (HCC)   Alcohol abuse   Hypotension   Hypophosphatemia   COPD with acute exacerbation (HCC)   Assessment and Plan: Adrenal crisis with severe hypotension. Upper respiratory infection secondary to influenza A Chronic hypotension. Acute bronchitis with COPD exacerbation. Patient developed severe hypotension on 1/25, received 1.5 L fluid bolus.  Patient also developed upper respite symptoms, adrenal crisis was due to upper respite infection.  Patient is treated with Solu-Cortef 100 mg IV every 8 hours, blood pressure much better.  Procalcitonin level not significant elevated, no bacterial infection.  Influenza was positive. Patient appeared to have acute bronchitis associated with influenza virus. Patient still have significant cough, some short of breath.  Continue IV steroids with Solu-Cortef.   Continue scheduled bronchodilator.   Cocaine induced non-STEMI. Cocaine abuse. Per cardiology, no additional workup is indicated.  Patient has completed 48 hours of heparin.   Alcohol intoxication. Alcoholic hepatitis with jaundice. Improved, no evidence of alcohol withdrawal.   Chronic diastolic congestive heart failure. Pulmonary hypertension. Patient currently does not have exacerbation of congestive heart failure, BNP level is moderately elevated, but chest x-ray did not show any congestion.  CT scan did show evidence of pulmonary hypertension. Patient does not have exacerbation congestive heart failure.   Hypokalemia Metabolic acidosis Hypophosphatemia. Hyponatremia. Hypomagnesemia. Continue replete potassium.  Magnesium and phosphate normalized.  Sodium level 133.  Recheck levels tomorrow.   Anemia and thrombocytopenia. Iron deficient anemia. Patient had chronic thrombocytopenia from alcohol, he also has some iron deficiency on recent iron study.  B12 level was normal.  Started on iron supplement.  Continue to follow.   Obesity with BMI 31.44.   Diet and excise.         Subjective:  Patient still has significant cough, some short of breath.  Physical Exam: Vitals:   01/12/24 0016 01/12/24 0438 01/12/24 0744 01/12/24 0811  BP: 105/65 123/65  110/60  Pulse: 79 75  75  Resp:  19    Temp: 98.5 F (36.9 C) 97.9 F (36.6 C)  (!) 97 F (36.1 C)  TempSrc: Oral     SpO2: 98% 98% 97% 96%  Weight:      Height:       General exam: Appears calm and comfortable, obese  Respiratory system: Decreased breathing sounds with rhonchi in the base. Respiratory effort normal. Cardiovascular system: S1 & S2 heard, RRR. No JVD, murmurs, rubs, gallops or clicks. No pedal edema. Gastrointestinal system: Abdomen is nondistended, soft  and nontender. No organomegaly or masses felt. Normal bowel sounds heard. Central nervous system: Alert and oriented. No focal neurological  deficits. Extremities: Symmetric 5 x 5 power. Skin: No rashes, lesions or ulcers Psychiatry:  Mood & affect appropriate.    Data Reviewed:  Lab results reviewed.  Family Communication: None  Disposition: Status is: Inpatient Remains inpatient appropriate because: Severity of disease, IV treatment.     Time spent: 50 minutes  Author: Marrion Coy, MD 01/12/2024 11:43 AM  For on call review [email or web address].

## 2024-01-12 NOTE — Care Management Important Message (Signed)
Important Message  Patient Details  Name: Ethan Wells MRN: [id number] Date of Birth: 12-23-1970   Important Message Given:  Yes - Medicare IM     Sherilyn Banker 01/12/2024, 11:31 AM

## 2024-01-13 DIAGNOSIS — I214 Non-ST elevation (NSTEMI) myocardial infarction: Secondary | ICD-10-CM | POA: Diagnosis not present

## 2024-01-13 DIAGNOSIS — E272 Addisonian crisis: Secondary | ICD-10-CM | POA: Diagnosis not present

## 2024-01-13 DIAGNOSIS — J101 Influenza due to other identified influenza virus with other respiratory manifestations: Secondary | ICD-10-CM | POA: Diagnosis not present

## 2024-01-13 LAB — BASIC METABOLIC PANEL
Anion gap: 5 (ref 5–15)
BUN: 15 mg/dL (ref 6–20)
CO2: 23 mmol/L (ref 22–32)
Calcium: 8.1 mg/dL — ABNORMAL LOW (ref 8.9–10.3)
Chloride: 105 mmol/L (ref 98–111)
Creatinine, Ser: 0.6 mg/dL — ABNORMAL LOW (ref 0.61–1.24)
GFR, Estimated: >60 mL/min (ref >60)
Glucose, Bld: 133 mg/dL — ABNORMAL HIGH (ref 70–99)
Potassium: 3.9 mmol/L (ref 3.5–5.1)
Sodium: 133 mmol/L — ABNORMAL LOW (ref 135–145)

## 2024-01-13 LAB — PHOSPHORUS: Phosphorus: 2.5 mg/dL (ref 2.5–4.6)

## 2024-01-13 LAB — MAGNESIUM: Magnesium: 2.1 mg/dL (ref 1.7–2.4)

## 2024-01-13 MED ORDER — HYDROCORTISONE 10 MG PO TABS
20.0000 mg | ORAL_TABLET | Freq: Two times a day (BID) | ORAL | Status: DC
  Administered 2024-01-13 – 2024-01-14 (×2): 20 mg via ORAL
  Filled 2024-01-13 (×3): qty 2

## 2024-01-13 MED ORDER — PHENOL 1.4 % MT LIQD
1.0000 | OROMUCOSAL | Status: DC | PRN
  Administered 2024-01-13 – 2024-01-14 (×3): 1 via OROMUCOSAL
  Filled 2024-01-13: qty 177

## 2024-01-13 MED ORDER — OSELTAMIVIR PHOSPHATE 75 MG PO CAPS
75.0000 mg | ORAL_CAPSULE | Freq: Two times a day (BID) | ORAL | Status: DC
  Administered 2024-01-13 – 2024-01-14 (×3): 75 mg via ORAL
  Filled 2024-01-13 (×4): qty 1

## 2024-01-13 NOTE — TOC Progression Note (Signed)
Transition of Care [facility]) - Progression Note    Patient Details  Name: Ethan Wells MRN: [id number] Date of Birth: 1971-09-11  Transition of Care [facility] CM/SW Contact  Truddie Hidden, RN Phone Number: 01/13/2024, 3:59 PM  Clinical Narrative:    Patient is oriented to person. Attempt to reach his cousin, [name]. No answer. Unable to leave a message .        Expected Discharge Plan and Services                                               Social Determinants of Health (SDOH) Interventions SDOH Screenings   Food Insecurity: No Food Insecurity (01/08/2024)  Housing: Low Risk  (01/08/2024)  Transportation Needs: No Transportation Needs (01/08/2024)  Recent Concern: Transportation Needs - Unmet Transportation Needs (12/13/2023)  Utilities: Not At Risk (01/08/2024)  Alcohol Screen: Medium Risk (10/01/2022)  Financial Resource Strain: Low Risk  (12/15/2023)  Social Connections: Unknown (01/08/2024)  Tobacco Use: High Risk (01/07/2024)    Readmission Risk Interventions    01/08/2024    3:53 PM 12/15/2023    2:55 PM 10/16/2023   12:56 PM  Readmission Risk Prevention Plan  Transportation Screening Complete Complete Complete  Medication Review Oceanographer) Complete Complete Complete  PCP or Specialist appointment within 3-5 days of discharge Complete Complete Complete  HRI or Home Care Consult   Patient refused  SW Recovery Care/Counseling Consult Complete Complete Patient refused  Palliative Care Screening Not Applicable Not Applicable Patient Refused  Skilled Nursing Facility Not Applicable Not Applicable Patient Refused

## 2024-01-13 NOTE — Progress Notes (Signed)
Progress Note   [name] MWU:[id number] DOB: 1971-12-02 DOA: 01/08/2024     5 DOS: the patient was seen and examined on 01/13/2024   Brief hospital course: Ethan Wells is a 53 y.o. male with medical history significant of alcohol abuse with alcoholic liver cirrhosis with portal hypertension status post TIPS, polysubstance abuse, cocaine abuse, orthostatic hypotension on chronic midodrine, SMA stenosis, HTN, HLD, CAD STEMI, chronic HFpEF, GERD, presented with chest pain.  Upon arriving the hospital, patient was intoxicated, alcohol level was 243, troponin 293.  Chest x-ray did not show acute changes. Elevated troponin appear to be secondary to cocaine, no additional workup is indicated per cardiology.  Patient is treated with heparin for 48 hours. Patient developed significant hypotension on 1/25, patient also has upper respite infection.  He was started on IV Solu-Cortef for adrenal crisis.  Patient was influenza positive.   Principal Problem:   NSTEMI (non-ST elevated myocardial infarction) (HCC) Active Problems:   Adrenal crisis (HCC)   Hypokalemia   Cocaine abuse (HCC)   Alcohol abuse   Hypotension   Hypophosphatemia   COPD with acute exacerbation (HCC)   Influenza A   Assessment and Plan: Adrenal crisis with severe hypotension. Upper respiratory infection secondary to influenza A Chronic hypotension. Acute bronchitis with COPD exacerbation. Patient developed severe hypotension on 1/25, received 1.5 L fluid bolus.  Patient also developed upper respite symptoms, adrenal crisis was due to upper respite infection.  Patient is treated with Solu-Cortef 100 mg IV every 8 hours, blood pressure much better.  Procalcitonin level not significant elevated, no bacterial infection.  Influenza was positive. Patient appeared to have acute bronchitis associated with influenza virus. Patient still have significant cough, some short of breath.  He is treated with IV  Solu-Cortef as well as bronchodilator. Condition appears to be improving, blood pressure is more stable, I will change Solu-Cortef to oral 20 mg twice a day.  Continue monitor blood pressure for day, can discharge home tomorrow if blood pressure can be maintained.  Patient also on midodrine 5 mg 3 times a day.   Cocaine induced non-STEMI. Cocaine abuse. Per cardiology, no additional workup is indicated.  Patient has completed 48 hours of heparin.   Alcohol intoxication. Alcoholic hepatitis with jaundice. Improved, no evidence of alcohol withdrawal.   Chronic diastolic congestive heart failure. Pulmonary hypertension. Patient currently does not have exacerbation of congestive heart failure, BNP level is moderately elevated, but chest x-ray did not show any congestion.  CT scan did show evidence of pulmonary hypertension. Patient does not have exacerbation congestive heart failure.   Hypokalemia Metabolic acidosis Hypophosphatemia. Hyponatremia. Hypomagnesemia. Continue replete potassium.  Magnesium and phosphate normalized.  Sodium level 133.     Anemia and thrombocytopenia. Iron deficient anemia. Patient had chronic thrombocytopenia from alcohol, he also has some iron deficiency on recent iron study.  B12 level was normal.  Started on iron supplement.     Obesity with BMI 31.44.   Diet and excise.            Subjective:  Patient doing better, still complain short of breath with exertion.  But no wheezing.  Minimal cough, nonproductive.  Physical Exam: Vitals:   01/13/24 0215 01/13/24 0724 01/13/24 0800 01/13/24 1346  BP: 122/76  120/73 107/64  Pulse: 74   63  Resp: 20   16  Temp: 97.9 F (36.6 C)  98.6 F (37 C) 98.1 F (36.7 C)  TempSrc: Oral  Oral   SpO2: 100%  100% 98% 96%  Weight:      Height:       General exam: Appears calm and comfortable  Respiratory system: Decreased breath sounds. Respiratory effort normal. Cardiovascular system: S1 & S2 heard, RRR.  No JVD, murmurs, rubs, gallops or clicks. No pedal edema. Gastrointestinal system: Abdomen is nondistended, soft and nontender. No organomegaly or masses felt. Normal bowel sounds heard. Central nervous system: Alert and oriented. No focal neurological deficits. Extremities: Symmetric 5 x 5 power. Skin: No rashes, lesions or ulcers Psychiatry: Judgement and insight appear normal. Mood & affect appropriate.    Data Reviewed:  Lab results reviewed.  Family Communication: None.  Disposition: Status is: Inpatient Remains inpatient appropriate because: Severity of disease. Probable discharge to home with home care tomorrow.     Time spent: 35 minutes  Author: Marrion Coy, MD 01/13/2024 4:52 PM  For on call review [email or web address].

## 2024-01-14 DIAGNOSIS — I214 Non-ST elevation (NSTEMI) myocardial infarction: Secondary | ICD-10-CM | POA: Diagnosis not present

## 2024-01-14 MED ORDER — OSELTAMIVIR PHOSPHATE 75 MG PO CAPS: 75.0000 mg | ORAL_CAPSULE | Freq: Two times a day (BID) | ORAL | 0 refills | Status: AC

## 2024-01-14 MED ORDER — ASPIRIN 81 MG PO TBEC: 81.0000 mg | DELAYED_RELEASE_TABLET | Freq: Every day | ORAL | 12 refills | Status: AC

## 2024-01-14 MED ORDER — THIAMINE HCL 100 MG PO TABS: 100.0000 mg | ORAL_TABLET | Freq: Every day | ORAL | 0 refills | Status: DC

## 2024-01-14 MED ORDER — EZETIMIBE 10 MG PO TABS: 10.0000 mg | ORAL_TABLET | Freq: Every day | ORAL | 0 refills | Status: DC

## 2024-01-14 MED ORDER — HYDROCORTISONE 20 MG PO TABS: ORAL_TABLET | ORAL | 0 refills | Status: DC

## 2024-01-14 MED ORDER — FERROUS SULFATE 325 (65 FE) MG PO TBEC: 325.0000 mg | DELAYED_RELEASE_TABLET | Freq: Two times a day (BID) | ORAL | 3 refills | Status: DC

## 2024-01-14 NOTE — Discharge Summary (Signed)
Physician Discharge Summary   [name] MRN: [id number] DOB: [birth year]  Admit date:     01/08/2024  Discharge date: 01/14/24  Discharge Physician: Loyce Dys   PCP: Pcp, No   Recommendations at discharge:  Follow-up with PCP   Discharge Diagnoses: Adrenal crisis with severe hypotension. Upper respiratory infection secondary to influenza A Chronic hypotension. Acute bronchitis with COPD exacerbation. Cocaine induced non-STEMI. Cocaine abuse. Alcohol intoxication. Alcoholic hepatitis with jaundice. Chronic diastolic congestive heart failure. Pulmonary hypertension. Hypokalemia Metabolic acidosis Hypophosphatemia. Hyponatremia. Hypomagnesemia. Anemia and thrombocytopenia. Iron deficient anemia. Obesity with BMI 31.44.       Hospital Course: KALEEM SARTWELL is a 53 y.o. male with medical history significant of alcohol abuse with alcoholic liver cirrhosis with portal hypertension status post TIPS, polysubstance abuse, cocaine abuse, orthostatic hypotension on chronic midodrine, SMA stenosis, HTN, HLD, CAD STEMI, chronic HFpEF, GERD, presented with chest pain.  Upon arriving the hospital, patient was intoxicated, alcohol level was 243, troponin 293.  Chest x-ray did not show acute changes. Elevated troponin appear to be secondary to cocaine, no additional workup is indicated per cardiology.  Patient is treated with heparin for 48 hours. Patient developed significant hypotension on 1/25, patient also has upper respite infection.  He was started on IV Solu-Cortef for adrenal crisis.  Patient was influenza positive.  Blood pressure abnormalities and patient is currently undergoing steroid taper.  Have been counseled on compliance and the need to follow-up with PCP.  PT also work with patient and patient meets criteria for home health.  Consultants: None Procedures performed: None Disposition: Home Diet recommendation:  Cardiac diet DISCHARGE  MEDICATION: Allergies as of 01/14/2024       Reactions   Penicillins Hives, Rash   Tolerated Ceftriaxone 10/2022        Medication List     STOP taking these medications    furosemide 40 MG tablet Commonly known as: Lasix   isosorbide mononitrate 30 MG 24 hr tablet Commonly known as: IMDUR       TAKE these medications    albuterol 108 (90 Base) MCG/ACT inhaler Commonly known as: VENTOLIN HFA Inhale 2 puffs into the lungs every 6 (six) hours as needed for wheezing or shortness of breath.   aspirin EC 81 MG tablet Take 1 tablet (81 mg total) by mouth daily. Swallow whole. Start taking on: January 15, 2024   chlorpheniramine-HYDROcodone 10-8 MG/5ML Commonly known as: TUSSIONEX Take 5 mLs by mouth every 12 (twelve) hours as needed for cough.   Compressor/Nebulizer Misc Nebulizer + mask/hoses per patient preference and insurance coverage, use as directed   docusate sodium 100 MG capsule Commonly known as: COLACE Take 1 capsule (100 mg total) by mouth 2 (two) times daily as needed (if less than 3 BM"s in past 24 hours).   ezetimibe 10 MG tablet Commonly known as: ZETIA Take 1 tablet (10 mg total) by mouth daily.   feeding supplement Liqd Take 237 mLs by mouth 2 (two) times daily between meals.   ferrous sulfate 325 (65 FE) MG EC tablet Take 1 tablet (325 mg total) by mouth 2 (two) times daily.   fluticasone furoate-vilanterol 100-25 MCG/ACT Aepb Commonly known as: BREO ELLIPTA Inhale 1 puff into the lungs daily.   gabapentin 100 MG capsule Commonly known as: NEURONTIN Take 1 capsule (100 mg total) by mouth 2 (two) times daily.   Geri-Lanta 200-200-20 MG/5ML suspension Generic drug: alum & mag hydroxide-simeth Take 30 mLs by mouth every 6 (six)  hours as needed for indigestion or heartburn.   hydrocortisone 20 MG tablet Commonly known as: CORTEF Take 1 tablet (20 mg total) by mouth 2 (two) times daily for 3 days, THEN 1 tablet (20 mg total) daily for 7  days, THEN 0.5 tablets (10 mg total) daily for 7 days. Start taking on: January 14, 2024   ipratropium-albuterol 0.5-2.5 (3) MG/3ML Soln Commonly known as: DUONEB Take 3 mLs by nebulization every 2 (two) hours as needed (wheeze, SOB).   magic mouthwash Soln 12mL Anbesol 30mL Benadryl 30mL Mylanta  5mL swish, gargle & spit q8hr prn throat discomfort   midodrine 5 MG tablet Commonly known as: PROAMATINE Take 1 tablet (5 mg total) by mouth 3 (three) times daily with meals.   Nebulizer/Tubing/Mouthpiece Kit As directed   ondansetron 4 MG disintegrating tablet Commonly known as: ZOFRAN-ODT Take 1 tablet (4 mg total) by mouth every 8 (eight) hours as needed for nausea or vomiting.   oseltamivir 75 MG capsule Commonly known as: TAMIFLU Take 1 capsule (75 mg total) by mouth 2 (two) times daily for 3 days.   pantoprazole 40 MG tablet Commonly known as: PROTONIX Take 1 tablet (40 mg total) by mouth 2 (two) times daily.   polyethylene glycol powder 17 GM/SCOOP powder Commonly known as: GLYCOLAX/MIRALAX Take 17 g by mouth daily as needed (if less than 3 BM"s in past 24 hours).   potassium chloride SA 20 MEQ tablet Commonly known as: KLOR-CON M Take 1 tablet (20 mEq total) by mouth daily.   rosuvastatin 40 MG tablet Commonly known as: CRESTOR TAKE 1 TABLET BY MOUTH EVERY DAY   thiamine 100 MG tablet Commonly known as: VITAMIN B1 Take 1 tablet (100 mg total) by mouth daily. Start taking on: January 15, 2024        Discharge Exam: Ceasar Mons Weights   01/07/24 2124 01/09/24 0442  Weight: 83 kg 85.7 kg   General exam: Appears calm and comfortable  Respiratory system: Decreased breath sounds. Respiratory effort normal. Cardiovascular system: S1 & S2 heard, RRR. No JVD, murmurs, rubs, gallops or clicks. No pedal edema. Gastrointestinal system: Abdomen is nondistended, soft and nontender. No organomegaly or masses felt. Normal bowel sounds heard. Central nervous system: Alert  and oriented. No focal neurological deficits. Extremities: Symmetric 5 x 5 power. Skin: No rashes, lesions or ulcers Psychiatry: Judgement and insight appear normal. Mood & affect appropriate.   Condition at discharge: good  The results of significant diagnostics from this hospitalization (including imaging, microbiology, ancillary and laboratory) are listed below for reference.   Imaging Studies: DG Chest Port 1 View Result Date: 01/10/2024 CLINICAL DATA:  Shortness of breath. EXAM: PORTABLE CHEST 1 VIEW COMPARISON:  01/07/2024 FINDINGS: Stable heart size and mediastinal contours. No focal airspace disease, pulmonary edema, pleural effusion or pneumothorax. On limited assessment, no acute osseous findings. IMPRESSION: No acute chest findings. Electronically Signed   By: Narda Rutherford M.D.   On: 01/10/2024 17:17   DG Chest 1 View Result Date: 01/07/2024 CLINICAL DATA:  Cough and generalized body aches EXAM: CHEST  1 VIEW COMPARISON:  01/03/2024 FINDINGS: Stable cardiomediastinal silhouette. No focal consolidation, pleural effusion, or pneumothorax. No displaced rib fractures. IMPRESSION: No active disease. Electronically Signed   By: Minerva Fester M.D.   On: 01/07/2024 01:07   CT Angio Chest/Abd/Pel for Dissection W and/or Wo Contrast Result Date: 01/03/2024 CLINICAL DATA:  53 year old male with history of epigastric pain radiating to the back and chest. Suspected aortic aneurism. EXAM: CT ANGIOGRAPHY  CHEST, ABDOMEN AND PELVIS TECHNIQUE: Non-contrast CT of the chest was initially obtained. Multidetector CT imaging through the chest, abdomen and pelvis was performed using the standard protocol during bolus administration of intravenous contrast. Multiplanar reconstructed images and MIPs were obtained and reviewed to evaluate the vascular anatomy. RADIATION DOSE REDUCTION: This exam was performed according to the departmental dose-optimization program which includes automated exposure control,  adjustment of the mA and/or kV according to patient size and/or use of iterative reconstruction technique. CONTRAST:  OMNIPAQUE IOHEXOL 350 MG/ML SOLN COMPARISON:  Chest CTA 12/12/2023. CTA of the chest, abdomen and pelvis 11/14/2023. FINDINGS: CTA CHEST FINDINGS Cardiovascular: Precontrast images demonstrate no crescentic high attenuation associated with the wall of the thoracic aorta to suggest the presence of acute intramural hemorrhage. Postcontrast images demonstrate no evidence of thoracic aortic aneurysm or dissection. Thoracic aorta is normal in caliber measuring 3.1 cm, 2.4 cm and 2.0 cm in diameter in the ascending, mid aortic arch and descending thoracic aorta respectively. Dilatation of the pulmonic trunk (3.7 cm in diameter). There is aortic atherosclerosis, as well as atherosclerosis of the great vessels of the mediastinum and the coronary arteries, including calcified atherosclerotic plaque in the left main and left anterior descending coronary arteries. Mediastinum/Nodes: No pathologically enlarged mediastinal or hilar lymph nodes. Hilar esophagus is unremarkable in appearance. No axillary lymphadenopathy. Lungs/Pleura: 5 mm subpleural nodule in the right middle lobe (axial image 83 of series 6), stable dating back to at least 05/09/2023, nonspecific, but statistically likely a benign subpleural lymph node (no imaging follow-up recommended). No other larger more suspicious appearing pulmonary nodules or masses are noted. No acute consolidative airspace disease. No pleural effusions. Musculoskeletal: There are no aggressive appearing lytic or blastic lesions noted in the visualized portions of the skeleton. Review of the MIP images confirms the above findings. CTA ABDOMEN AND PELVIS FINDINGS VASCULAR Aorta: Aortic atherosclerosis. Normal caliber aorta without aneurysm, dissection, vasculitis or significant stenosis. Celiac: Stent in the proximal celiac axis, patent. Patent without evidence of  aneurysm, dissection, vasculitis or significant stenosis. SMA: 2 stents in the proximal superior mesenteric artery, patent. Patent without evidence of aneurysm, dissection, vasculitis or significant stenosis. Renals: Single right and 2 left renal arteries (small accessory branch to the left lower pole incidentally noted). All renal arteries are patent without evidence of aneurysm, dissection, vasculitis, fibromuscular dysplasia or significant stenosis. IMA: Patent without evidence of aneurysm, dissection, vasculitis or significant stenosis. Inflow: Patent without evidence of aneurysm, dissection, vasculitis or significant stenosis. Veins: No obvious venous abnormality within the limitations of this arterial phase study. Review of the MIP images confirms the above findings. NON-VASCULAR Hepatobiliary: Liver has a shrunken appearance and nodular contour, indicative of underlying cirrhosis. TIPS noted, patency of which is not assessable on today's arterial phase examination. No definite suspicious cystic or solid hepatic lesions. No intra or extrahepatic biliary ductal dilatation. Gallbladder is unremarkable in appearance. Pancreas: No pancreatic mass. No pancreatic ductal dilatation. No pancreatic or peripancreatic fluid collections or inflammatory changes. Spleen: Unremarkable. Adrenals/Urinary Tract: Bilateral kidneys and bilateral adrenal glands are normal in appearance. No hydroureteronephrosis. Urinary bladder is unremarkable in appearance. Stomach/Bowel: The appearance of the stomach is normal. No pathologic dilatation of small bowel or colon. The appendix is not confidently identified and may be surgically absent. Regardless, there are no inflammatory changes noted adjacent to the cecum to suggest the presence of an acute appendicitis at this time. Lymphatic: No lymphadenopathy noted in the abdomen or pelvis. Reproductive: Prostate gland and seminal vesicles are  unremarkable in appearance. Other: No significant  volume of ascites.  No pneumoperitoneum. Musculoskeletal: There are no aggressive appearing lytic or blastic lesions noted in the visualized portions of the skeleton. Review of the MIP images confirms the above findings. IMPRESSION: 1. No acute findings are noted in the chest, abdomen or pelvis to account for the patient's symptoms. Specifically, no evidence of thoracic or abdominal aortic aneurysm or dissection. 2. Aortic atherosclerosis, in addition to left main and left anterior descending coronary artery disease. 3. Dilatation of the pulmonic trunk (3.7 cm in diameter), suggesting pulmonary arterial hypertension. 4. Cirrhosis with TIPS, patency of which is not assessable on today's arterial phase examination. No significant volume of ascites. Aortic Atherosclerosis (ICD10-I70.0). Electronically Signed   By: Trudie Reed M.D.   On: 01/03/2024 05:34   DG Chest 2 View Result Date: 01/03/2024 CLINICAL DATA:  Chest pain EXAM: CHEST - 2 VIEW COMPARISON:  None Available. FINDINGS: Heart and mediastinal contours are within normal limits. No focal opacities or effusions. No acute bony abnormality. IMPRESSION: No active cardiopulmonary disease. Electronically Signed   By: Charlett Nose M.D.   On: 01/03/2024 01:51   DG Chest 2 View Result Date: 01/02/2024 CLINICAL DATA:  Chest pain EXAM: CHEST - 2 VIEW COMPARISON:  12/31/2023 FINDINGS: Lungs are clear.  No pleural effusion or pneumothorax. The heart is normal in size. Visualized osseous structures are within normal limits. IMPRESSION: Normal chest radiographs. Electronically Signed   By: Charline Bills M.D.   On: 01/02/2024 04:00   DG Chest Port 1 View Result Date: 12/31/2023 CLINICAL DATA:  Chest pain that radiates to the back. EXAM: PORTABLE CHEST 1 VIEW COMPARISON:  December 12, 2023 FINDINGS: The cardiac silhouette is mildly enlarged and unchanged in size. There is no evidence of an acute infiltrate, pleural effusion or pneumothorax. The visualized  skeletal structures are unremarkable. IMPRESSION: No active cardiopulmonary disease. Electronically Signed   By: Aram Candela M.D.   On: 12/31/2023 22:28    Microbiology: Results for orders placed or performed during the hospital encounter of 01/08/24  Culture, blood (Routine X 2) w Reflex to ID Panel     Status: None (Preliminary result)   Collection Time: 01/10/24  4:53 PM   Specimen: BLOOD LEFT ARM  Result Value Ref Range Status   Specimen Description BLOOD LEFT ARM  Final   Special Requests   Final    BOTTLES DRAWN AEROBIC AND ANAEROBIC Blood Culture results may not be optimal due to an inadequate volume of blood received in culture bottles   Culture   Final    NO GROWTH 4 DAYS Performed at [facility], [address], [place], [place] [address]    Report Status PENDING  Incomplete  Culture, blood (Routine X 2) w Reflex to ID Panel     Status: None (Preliminary result)   Collection Time: 01/10/24  4:54 PM   Specimen: BLOOD LEFT HAND  Result Value Ref Range Status   Specimen Description BLOOD LEFT HAND  Final   Special Requests   Final    BOTTLES DRAWN AEROBIC AND ANAEROBIC Blood Culture results may not be optimal due to an inadequate volume of blood received in culture bottles   Culture   Final    NO GROWTH 4 DAYS Performed at [facility], [address], [place], [place] [address]    Report Status PENDING  Incomplete  Respiratory (~20 pathogens) panel by PCR     Status: Abnormal   Collection Time: 01/11/24  1:59 PM   Specimen: Nasopharyngeal Swab; Respiratory  Result Value Ref Range Status   Adenovirus NOT DETECTED NOT DETECTED Final   Coronavirus 229E NOT DETECTED NOT DETECTED Final    Comment: (NOTE) The Coronavirus on the Respiratory Panel, DOES NOT test for the novel  Coronavirus (2019 nCoV)    Coronavirus HKU1 NOT DETECTED NOT DETECTED Final   Coronavirus NL63 NOT DETECTED NOT DETECTED Final   Coronavirus OC43 NOT DETECTED NOT DETECTED  Final   Metapneumovirus NOT DETECTED NOT DETECTED Final   Rhinovirus / Enterovirus NOT DETECTED NOT DETECTED Final   Influenza A H1 2009 DETECTED (A) NOT DETECTED Final   Influenza B NOT DETECTED NOT DETECTED Final   Parainfluenza Virus 1 NOT DETECTED NOT DETECTED Final   Parainfluenza Virus 2 NOT DETECTED NOT DETECTED Final   Parainfluenza Virus 3 NOT DETECTED NOT DETECTED Final   Parainfluenza Virus 4 NOT DETECTED NOT DETECTED Final   Respiratory Syncytial Virus NOT DETECTED NOT DETECTED Final   Bordetella pertussis NOT DETECTED NOT DETECTED Final   Bordetella Parapertussis NOT DETECTED NOT DETECTED Final   Chlamydophila pneumoniae NOT DETECTED NOT DETECTED Final   Mycoplasma pneumoniae NOT DETECTED NOT DETECTED Final    Comment: Performed at [facility], [address] [place], [place] [address]    Labs: CBC: Recent Labs  Lab 01/07/24 2126 01/08/24 1449 01/09/24 0513 01/10/24 0555 01/10/24 1700 01/12/24 0506  WBC 4.4  --  7.0 8.3 6.0 4.4  HGB 10.0* 7.9* 8.3* 10.0* 9.1* 8.7*  HCT 30.7* 23.5* 25.3* 30.5* 27.0* 26.4*  MCV 91.6  --  91.7 91.9 90.0 91.0  PLT 93*  --  89* 105* 84* 77*   Basic Metabolic Panel: Recent Labs  Lab 01/07/24 2126 01/09/24 0513 01/10/24 0555 01/10/24 1700 01/11/24 0308 01/12/24 0506 01/13/24 0627  NA 138   < > 131* 135 135 133* 133*  K 3.2*   < > 3.0* 4.0 3.6 3.4* 3.9  CL 103   < > 98 103 103 103 105  CO2 15*   < > 22 22 22 23 23   GLUCOSE 215*   < > 128* 160* 154* 134* 133*  BUN 8   < > 19 18 19 15 15   CREATININE 0.56*   < > 0.77 0.89 0.77 0.58* 0.60*  CALCIUM 8.3*   < > 8.2* 7.7* 7.6* 7.9* 8.1*  MG 1.7  --  1.8  --  1.4* 2.4 2.1  PHOS  --   --  1.3*  --  3.2 2.6 2.5   < > = values in this interval not displayed.   Liver Function Tests: Recent Labs  Lab 01/07/24 2126 01/09/24 0513 01/10/24 1700  AST 71* 56* 68*  ALT 18 21 20   ALKPHOS 163* 129* 132*  BILITOT 3.5* 4.2* 4.3*  PROT 7.7 6.6 6.8  ALBUMIN 3.2* 2.8* 2.9*    CBG: No results for input(s): "GLUCAP" in the last 168 hours.  Discharge time spent:  .  Signed: Loyce Dys, MD [facility] 01/14/2024

## 2024-01-14 NOTE — Progress Notes (Signed)
Attempted to review d/c paperwork with pt, pt refuses to take paperwork bec he is contesting d/c, MD and CM notified of pt's request, CM at bedside giving pt medicare paperwork and explaining appealing process, pt on the phone at this time with medicare, we will wait to d/c pt.

## 2024-01-14 NOTE — TOC Transition Note (Addendum)
Transition of Care [facility]) - Discharge Note   Patient Details  Name: Ethan Wells MRN: [id number] Date of Birth: 05-Aug-1971  Transition of Care [facility] CM/SW Contact:  Truddie Hidden, RN Phone Number: 01/14/2024, 2:23 PM   Clinical Narrative:    Spoke with patient's Cousin regarding discharge and therapy's recommendation for home health. Patient cousin refused HH for patient. "He's not going to act right."  TOC signing off.   3:45pm Recived message from patient's nurse stating patient wished to appeal his discharge but lost the number to call.   4:10pm Enterred patient's room. He stated he was on the phone with medicare. Patient provided with IM letter. He stated he wasnted his IV out. He was advised his IV would need to remain if he was appealing. "I guess I'm going home, they are telling me I can't appeal." "I'm going home." Nurse notified.             Patient Goals and CMS Choice            Discharge Placement                       Discharge Plan and Services Additional resources added to the After Visit Summary for                                       Social Drivers of Health (SDOH) Interventions SDOH Screenings   Food Insecurity: No Food Insecurity (01/08/2024)  Housing: Low Risk  (01/08/2024)  Transportation Needs: No Transportation Needs (01/08/2024)  Recent Concern: Transportation Needs - Unmet Transportation Needs (12/13/2023)  Utilities: Not At Risk (01/08/2024)  Alcohol Screen: Medium Risk (10/01/2022)  Financial Resource Strain: Low Risk  (12/15/2023)  Social Connections: Unknown (01/08/2024)  Tobacco Use: High Risk (01/07/2024)     Readmission Risk Interventions    01/08/2024    3:53 PM 12/15/2023    2:55 PM 10/16/2023   12:56 PM  Readmission Risk Prevention Plan  Transportation Screening Complete Complete Complete  Medication Review Oceanographer) Complete Complete Complete  PCP or Specialist appointment within 3-5  days of discharge Complete Complete Complete  HRI or Home Care Consult   Patient refused  SW Recovery Care/Counseling Consult Complete Complete Patient refused  Palliative Care Screening Not Applicable Not Applicable Patient Refused  Skilled Nursing Facility Not Applicable Not Applicable Patient Refused

## 2024-01-14 NOTE — Progress Notes (Signed)
PT Cancellation Note  Patient Details Name: Ethan Wells MRN: [id number] DOB: [birth year]   Cancelled Treatment:     Therapist in to see pt for continued PT services. Pt resting in bed, lights off with c/o increased fatigue and inability to tolerate any activity. Nursing notified who stated pt has been walking in room with personal SPC. Will re-attempt next available date/time per POC.   Jannet Askew 01/14/2024, 1:20 PM

## 2024-01-15 LAB — CULTURE, BLOOD (ROUTINE X 2)
Culture: NO GROWTH
Culture: NO GROWTH

## 2024-01-18 ENCOUNTER — Emergency Department: Payer: Medicare HMO

## 2024-01-18 ENCOUNTER — Encounter: Payer: Self-pay | Admitting: Medical Oncology

## 2024-01-18 ENCOUNTER — Emergency Department
Admission: EM | Admit: 2024-01-18 | Discharge: 2024-01-18 | Disposition: A | Discharge status: Home / Self Care | Payer: Medicare HMO | Attending: Emergency Medicine | Admitting: Emergency Medicine

## 2024-01-18 ENCOUNTER — Other Ambulatory Visit: Payer: Self-pay

## 2024-01-18 DIAGNOSIS — I110 Hypertensive heart disease with heart failure: Secondary | ICD-10-CM | POA: Diagnosis not present

## 2024-01-18 DIAGNOSIS — I2510 Atherosclerotic heart disease of native coronary artery without angina pectoris: Secondary | ICD-10-CM | POA: Insufficient documentation

## 2024-01-18 DIAGNOSIS — R0789 Other chest pain: Secondary | ICD-10-CM | POA: Diagnosis not present

## 2024-01-18 DIAGNOSIS — E785 Hyperlipidemia, unspecified: Secondary | ICD-10-CM | POA: Insufficient documentation

## 2024-01-18 DIAGNOSIS — R9431 Abnormal electrocardiogram [ECG] [EKG]: Secondary | ICD-10-CM | POA: Insufficient documentation

## 2024-01-18 DIAGNOSIS — I6523 Occlusion and stenosis of bilateral carotid arteries: Secondary | ICD-10-CM | POA: Diagnosis not present

## 2024-01-18 DIAGNOSIS — I10 Essential (primary) hypertension: Secondary | ICD-10-CM | POA: Diagnosis not present

## 2024-01-18 DIAGNOSIS — F10129 Alcohol abuse with intoxication, unspecified: Secondary | ICD-10-CM | POA: Diagnosis present

## 2024-01-18 DIAGNOSIS — Y908 Blood alcohol level of 240 mg/100 ml or more: Secondary | ICD-10-CM | POA: Insufficient documentation

## 2024-01-18 DIAGNOSIS — K7030 Alcoholic cirrhosis of liver without ascites: Secondary | ICD-10-CM | POA: Diagnosis not present

## 2024-01-18 DIAGNOSIS — M50322 Other cervical disc degeneration at C5-C6 level: Secondary | ICD-10-CM | POA: Insufficient documentation

## 2024-01-18 DIAGNOSIS — S0990XA Unspecified injury of head, initial encounter: Secondary | ICD-10-CM | POA: Diagnosis not present

## 2024-01-18 DIAGNOSIS — W19XXXA Unspecified fall, initial encounter: Secondary | ICD-10-CM | POA: Diagnosis not present

## 2024-01-18 DIAGNOSIS — I509 Heart failure, unspecified: Secondary | ICD-10-CM | POA: Insufficient documentation

## 2024-01-18 DIAGNOSIS — I6782 Cerebral ischemia: Secondary | ICD-10-CM | POA: Diagnosis not present

## 2024-01-18 DIAGNOSIS — F10920 Alcohol use, unspecified with intoxication, uncomplicated: Secondary | ICD-10-CM

## 2024-01-18 DIAGNOSIS — F10929 Alcohol use, unspecified with intoxication, unspecified: Secondary | ICD-10-CM | POA: Diagnosis not present

## 2024-01-18 DIAGNOSIS — S199XXA Unspecified injury of neck, initial encounter: Secondary | ICD-10-CM | POA: Diagnosis not present

## 2024-01-18 LAB — CBC WITH DIFFERENTIAL/PLATELET
Abs Immature Granulocytes: 0.07 10*3/uL (ref 0.00–0.07)
Basophils Absolute: 0 10*3/uL (ref 0.0–0.1)
Basophils Relative: 0 %
Eosinophils Absolute: 0.2 10*3/uL (ref 0.0–0.5)
Eosinophils Relative: 2 %
HCT: 29.5 % — ABNORMAL LOW (ref 39.0–52.0)
Hemoglobin: 9.4 g/dL — ABNORMAL LOW (ref 13.0–17.0)
Immature Granulocytes: 1 %
Lymphocytes Relative: 29 %
Lymphs Abs: 2.6 10*3/uL (ref 0.7–4.0)
MCH: 29.1 pg (ref 26.0–34.0)
MCHC: 31.9 g/dL (ref 30.0–36.0)
MCV: 91.3 fL (ref 80.0–100.0)
Monocytes Absolute: 0.9 10*3/uL (ref 0.1–1.0)
Monocytes Relative: 10 %
Neutro Abs: 5.4 10*3/uL (ref 1.7–7.7)
Neutrophils Relative %: 58 %
Platelets: 134 10*3/uL — ABNORMAL LOW (ref 150–400)
RBC: 3.23 MIL/uL — ABNORMAL LOW (ref 4.22–5.81)
RDW: 18 % — ABNORMAL HIGH (ref 11.5–15.5)
WBC: 9 10*3/uL (ref 4.0–10.5)
nRBC: 0 % (ref 0.0–0.2)

## 2024-01-18 LAB — URINE DRUG SCREEN, QUALITATIVE (ARMC ONLY)
Amphetamines, Ur Screen: NOT DETECTED
Barbiturates, Ur Screen: NOT DETECTED
Benzodiazepine, Ur Scrn: NOT DETECTED
Cannabinoid 50 Ng, Ur ~~LOC~~: NOT DETECTED
Cocaine Metabolite,Ur ~~LOC~~: NOT DETECTED
MDMA (Ecstasy)Ur Screen: NOT DETECTED
Methadone Scn, Ur: NOT DETECTED
Opiate, Ur Screen: NOT DETECTED
Phencyclidine (PCP) Ur S: NOT DETECTED
Tricyclic, Ur Screen: NOT DETECTED

## 2024-01-18 LAB — COMPREHENSIVE METABOLIC PANEL
ALT: 31 U/L (ref 0–44)
AST: 53 U/L — ABNORMAL HIGH (ref 15–41)
Albumin: 2.6 g/dL — ABNORMAL LOW (ref 3.5–5.0)
Alkaline Phosphatase: 141 U/L — ABNORMAL HIGH (ref 38–126)
Anion gap: 9 (ref 5–15)
BUN: 7 mg/dL (ref 6–20)
CO2: 23 mmol/L (ref 22–32)
Calcium: 8.5 mg/dL — ABNORMAL LOW (ref 8.9–10.3)
Chloride: 111 mmol/L (ref 98–111)
Creatinine, Ser: 0.59 mg/dL — ABNORMAL LOW (ref 0.61–1.24)
GFR, Estimated: >60 mL/min (ref >60)
Glucose, Bld: 140 mg/dL — ABNORMAL HIGH (ref 70–99)
Potassium: 3.3 mmol/L — ABNORMAL LOW (ref 3.5–5.1)
Sodium: 143 mmol/L (ref 135–145)
Total Bilirubin: 2.2 mg/dL — ABNORMAL HIGH (ref 0.0–1.2)
Total Protein: 6.6 g/dL (ref 6.5–8.1)

## 2024-01-18 LAB — MAGNESIUM: Magnesium: 1.7 mg/dL (ref 1.7–2.4)

## 2024-01-18 LAB — ETHANOL: Alcohol, Ethyl (B): 444 mg/dL (ref <10)

## 2024-01-18 NOTE — ED Triage Notes (Signed)
Pt from home via ems with reports that pt called his friend and when his friend arrived to his home pt was found on the floor intoxicated. Pt arrived to ED A/O x 4, states that he fell.

## 2024-01-18 NOTE — ED Provider Notes (Signed)
[name] Provider Note    Event Date/Time   First MD Initiated Contact with Patient 01/18/24 1251     (approximate)   History   Chief Complaint Alcohol Intoxication   HPI  Ethan Wells is a 53 y.o. male with past medical history of hypertension, hyperlipidemia, CAD, CHF, alcohol abuse, cirrhosis status post TIPS, and polysubstance use who presents to the ED complaining of alcohol intoxication.  Per EMS, patient had called a friend to his home, who then found him significantly intoxicated and unable to walk, then called EMS.  Patient admits to drinking "24 ounces" of wine this morning, denies any drug use.  He states that he fell earlier today, but is unable to provide details of the fall.  He complains of pain around his neck from the fall, otherwise denies complaints.     Physical Exam   Triage Vital Signs: ED Triage Vitals  Encounter Vitals Group     BP      Systolic BP Percentile      Diastolic BP Percentile      Pulse      Resp      Temp      Temp src      SpO2      Weight      Height      Head Circumference      Peak Flow      Pain Score      Pain Loc      Pain Education      Exclude from Growth Chart     Most recent vital signs: Vitals:   01/18/24 1254  BP: 114/63  Pulse: 95  Resp: 16  Temp: 98.1 F (36.7 C)  SpO2: 96%    Constitutional: Alert and alert, intoxicated appearing. Eyes: Conjunctivae are normal. Head: Atraumatic. Nose: No congestion/rhinnorhea. Mouth/Throat: Mucous membranes are moist.  Neck: Midline cervical spine tenderness to palpation noted. Cardiovascular: Normal rate, regular rhythm. Grossly normal heart sounds.  2+ radial pulses bilaterally. Respiratory: Normal respiratory effort.  No retractions. Lungs CTAB.  No chest wall tenderness to palpation noted. Gastrointestinal: Soft and nontender. No distention.  Ecchymosis to bilateral lower quadrants of the abdomen. Musculoskeletal: No lower  extremity tenderness nor edema.  Neurologic:  Normal speech and language. No gross focal neurologic deficits are appreciated.    ED Results / Procedures / Treatments   Labs (all labs ordered are listed, but only abnormal results are displayed) Labs Reviewed  CBC WITH DIFFERENTIAL/PLATELET - Abnormal; Notable for the following components:      Result Value   RBC 3.23 (*)    Hemoglobin 9.4 (*)    HCT 29.5 (*)    RDW 18.0 (*)    Platelets 134 (*)    All other components within normal limits  COMPREHENSIVE METABOLIC PANEL - Abnormal; Notable for the following components:   Potassium 3.3 (*)    Glucose, Bld 140 (*)    Creatinine, Ser 0.59 (*)    Calcium 8.5 (*)    Albumin 2.6 (*)    AST 53 (*)    Alkaline Phosphatase 141 (*)    Total Bilirubin 2.2 (*)    All other components within normal limits  ETHANOL - Abnormal; Notable for the following components:   Alcohol, Ethyl (B) 444 (*)    All other components within normal limits  MAGNESIUM  URINE DRUG SCREEN, QUALITATIVE ([facility] ONLY)     EKG  ED ECG REPORT I, Chesley Noon,  the attending physician, personally viewed and interpreted this ECG.   Date: 01/18/2024  EKG Time: 13:05  Rate: 89  Rhythm: normal sinus rhythm  Axis: Normal  Intervals:none  ST&T Change: Nonspecific ST/T wave abnormality  RADIOLOGY CT head reviewed and interpreted by me with no hemorrhage or midline shift.  PROCEDURES:  Critical Care performed: No  Procedures   MEDICATIONS ORDERED IN ED: Medications - No data to display   IMPRESSION / MDM / ASSESSMENT AND PLAN / ED COURSE  I reviewed the triage vital signs and the nursing notes.                              53 y.o. male with past medical history of hypertension, hyperlipidemia, CAD, CHF, cirrhosis status post TIPS, alcohol abuse, and polysubstance abuse who presents to the ED after being found down on the ground by his friend.  Patient's presentation is most consistent with acute  presentation with potential threat to life or bodily function.  Differential diagnosis includes, but is not limited to, intracranial injury, cervical spine injury, alcohol intoxication, arrhythmia, anemia, electrolyte abnormality, liver failure.  Patient intoxicated appearing but otherwise well, vital signs are unremarkable.  He admits to alcohol consumption and describes having a fall earlier today, but is unable to provide details.  We will check CT imaging of his head and cervical spine, he does have bruising to his abdomen, but this is likely due to recent Lovenox administration while admitted to the hospital.  EKG and labs are pending at this time.  EKG shows no evidence of arrhythmia or ischemia, labs show stable anemia with no significant leukocytosis, electrolyte abnormality, or AKI.  LFTs also appears similar to previous with mild elevation in bilirubin.  His ethanol level is significantly elevated, CT imaging of head and cervical spine are negative for acute process.  No findings concerning for traumatic injury to his trunk or extremities and we will observe patient until he is clinically sober.  At this point, he will likely be appropriate for discharge home.  Patient turned over to oncoming provider.      FINAL CLINICAL IMPRESSION(S) / ED DIAGNOSES   Final diagnoses:  Alcoholic intoxication without complication (HCC)  Alcoholic cirrhosis of liver without ascites (HCC)     Rx / DC Orders   ED Discharge Orders     None        Note:  This document was prepared using Dragon voice recognition software and may include unintentional dictation errors.   Chesley Noon, MD 01/18/24 [phone]

## 2024-01-18 NOTE — ED Notes (Signed)
Pt asleep at this time. Will obtain vitals and give pt snack when they wake up.

## 2024-01-18 NOTE — ED Provider Notes (Signed)
Patient received in signout from Dr. Larinda Buttery .  History of ethanol abuse and polysubstance abuse.  Presents acutely intoxicated.  Benign workup medically.  Not metabolize, tolerating p.o. intake and ambulating.  Suitable for outpatient management.   Delton Prairie, MD 01/18/24 [phone]

## 2024-01-18 NOTE — ED Notes (Signed)
Pt able to stand and get dressed and walk to wheel chair to go to lobby to wait on his ride.

## 2024-01-23 ENCOUNTER — Ambulatory Visit (INDEPENDENT_AMBULATORY_CARE_PROVIDER_SITE_OTHER): Payer: Medicare HMO | Admitting: Adult Health

## 2024-01-23 ENCOUNTER — Encounter: Payer: Self-pay | Admitting: Adult Health

## 2024-01-23 ENCOUNTER — Telehealth: Payer: Self-pay | Admitting: Family

## 2024-01-23 VITALS — BP 124/78 | HR 95 | Temp 98.7°F | Resp 18 | Ht 65.0 in | Wt 193.2 lb

## 2024-01-23 DIAGNOSIS — J449 Chronic obstructive pulmonary disease, unspecified: Secondary | ICD-10-CM

## 2024-01-23 DIAGNOSIS — H538 Other visual disturbances: Secondary | ICD-10-CM

## 2024-01-23 DIAGNOSIS — Z720 Tobacco use: Secondary | ICD-10-CM

## 2024-01-23 DIAGNOSIS — E7800 Pure hypercholesterolemia, unspecified: Secondary | ICD-10-CM | POA: Diagnosis not present

## 2024-01-23 DIAGNOSIS — F418 Other specified anxiety disorders: Secondary | ICD-10-CM

## 2024-01-23 DIAGNOSIS — Z8719 Personal history of other diseases of the digestive system: Secondary | ICD-10-CM | POA: Diagnosis not present

## 2024-01-23 DIAGNOSIS — F5101 Primary insomnia: Secondary | ICD-10-CM

## 2024-01-23 DIAGNOSIS — E782 Mixed hyperlipidemia: Secondary | ICD-10-CM | POA: Diagnosis not present

## 2024-01-23 DIAGNOSIS — F1090 Alcohol use, unspecified, uncomplicated: Secondary | ICD-10-CM

## 2024-01-23 DIAGNOSIS — Z789 Other specified health status: Secondary | ICD-10-CM

## 2024-01-23 DIAGNOSIS — D509 Iron deficiency anemia, unspecified: Secondary | ICD-10-CM | POA: Diagnosis not present

## 2024-01-23 DIAGNOSIS — G894 Chronic pain syndrome: Secondary | ICD-10-CM

## 2024-01-23 DIAGNOSIS — E876 Hypokalemia: Secondary | ICD-10-CM

## 2024-01-23 DIAGNOSIS — G629 Polyneuropathy, unspecified: Secondary | ICD-10-CM

## 2024-01-23 MED ORDER — SERTRALINE HCL 50 MG PO TABS: 50.0000 mg | ORAL_TABLET | Freq: Every day | ORAL | 3 refills | Status: AC

## 2024-01-23 MED ORDER — ROSUVASTATIN CALCIUM 40 MG PO TABS: 40.0000 mg | ORAL_TABLET | Freq: Every day | ORAL | 1 refills | Status: AC

## 2024-01-23 MED ORDER — QUETIAPINE FUMARATE 25 MG PO TABS: 25.0000 mg | ORAL_TABLET | Freq: Every day | ORAL | 1 refills | Status: AC

## 2024-01-23 MED ORDER — EZETIMIBE 10 MG PO TABS: 10.0000 mg | ORAL_TABLET | Freq: Every day | ORAL | 1 refills | Status: AC

## 2024-01-23 MED ORDER — FERROUS SULFATE 325 (65 FE) MG PO TBEC: 325.0000 mg | DELAYED_RELEASE_TABLET | Freq: Two times a day (BID) | ORAL | 3 refills | Status: AC

## 2024-01-23 MED ORDER — POTASSIUM CHLORIDE CRYS ER 20 MEQ PO TBCR: 20.0000 meq | EXTENDED_RELEASE_TABLET | Freq: Every day | ORAL | 0 refills | Status: AC

## 2024-01-23 MED ORDER — PANTOPRAZOLE SODIUM 40 MG PO TBEC: 40.0000 mg | DELAYED_RELEASE_TABLET | Freq: Two times a day (BID) | ORAL | 1 refills | Status: AC

## 2024-01-23 MED ORDER — GABAPENTIN 100 MG PO CAPS: 100.0000 mg | ORAL_CAPSULE | Freq: Two times a day (BID) | ORAL | 1 refills | Status: AC

## 2024-01-23 MED ORDER — THIAMINE HCL 100 MG PO TABS: 100.0000 mg | ORAL_TABLET | Freq: Every day | ORAL | 1 refills | Status: AC

## 2024-01-23 MED ORDER — ALBUTEROL SULFATE HFA 108 (90 BASE) MCG/ACT IN AERS: 2.0000 | INHALATION_SPRAY | Freq: Four times a day (QID) | RESPIRATORY_TRACT | 0 refills | Status: AC | PRN

## 2024-01-23 NOTE — Progress Notes (Signed)
 [facility] clinic  Provider:  Jereld Serum DNP  Code Status:  Full Code   Goals of Care:     01/18/2024    1:16 PM  Advanced Directives  Does Patient Have a Medical Advance Directive? No  Would patient like information on creating a medical advance directive? No - Patient declined     Chief Complaint  Patient presents with   Medical Management of Chronic Issues    Follow-up on multiple things   Immunizations    Influenza, Pneumonia, DTAP, Shingrix and Covid   Health Maintenance    Medicare Annual Wellness Visit   Discussed the use of AI scribe software for clinical note transcription with the patient, who gave verbal consent to proceed.  HPI: Patient is a 53 y.o. male seen today for medical management of chronic medical issues.  Ethan Wells is a 53 year old male with severe depression who presents for a follow-up visit. He is accompanied by his brother, who is not related by blood.  He has severe depression with a PHQ score of 24 and has not been taking his depression medications, including gabapentin  and Seroquel . He has a history of using Xanax  and Valium  for anxiety. No current suicidal ideation or plans to harm himself.  He experiences chronic pain and neuropathy, primarily in his feet, described as burning with a pain level of 9 out of 10. He has not been receiving gabapentin , previously prescribed at 300 mg for neuropathy, and uses alcohol  to manage his pain.  He has a history of gastrointestinal bleeding and reports hematochezia as recently as the previous night. He has undergone hemorrhoid and esophageal surgeries, which he states worsened his bleeding. He has not been following up with a gastroenterologist recently.  He has COPD and asthma, previously managed with Breo Ellipta  and albuterol , but has not filled these prescriptions in the last six months. He uses a nebulizer at home.  He has a history of hypokalemia and has been advised to have blood work  done to recheck his potassium levels. He mentions frequent hospitalizations and finds venipuncture painful. He has not been taking his prescribed medications regularly due to lack of access and transportation issues.  He has a history of hypertension, with a current blood pressure reading of 124/78 mmHg. He was previously on lisinopril  but is now supposed to take midodrine , which he has not been taking due to lack of access. He reports stable blood pressure without medication.  He has hyperlipidemia and anemia, with elevated LDL levels and a need for iron  supplementation. He has not been taking Zetia  or iron  due to lack of access.  He drinks alcohol  regularly, with the last consumption being the previous night, but does not have a consistent drinking pattern due to financial constraints. No drug use and has been tested with no substances found in his system.  He experiences blurry vision and equilibrium issues, with episodes of vision loss and falls. He attributes these symptoms to a spinal issue affecting his legs, causing them to give out.  He has been homeless for five years and currently lives with his 76 year old mother. Financial constraints affect his ability to purchase alcohol  and medications. He smokes about a pack of cigarettes a month.       Past Medical History:  Diagnosis Date   (HFpEF) heart failure with preserved ejection fraction (HCC)    Addison disease (HCC)    CAD (coronary artery disease)    a. 01/2011 Anterior STEMI/Cath/PCI: LM nl,  LAD 100d (3.5x63mm Vision BMS placed), LCX 44m, RI 50, RCA min irregs, EF 40% w/ apical, inferoapical HK; b. 10/2022 NSTSEMI/Cath: LM nl, LAD 5p/m, RI 85, LCX 168m, OM1 mild dzs, OM3 100 fills via L->L collats from dLAD, RCA 80p, 191m, RPDA fills via collats from LAD. EF 45-50%-->Med Rx.   CAD, multiple vessel    Cardiac arrest - ventricular fibrillation    a. In setting of STEMI 01/2011   Chronic, continuous use of opioids    Cocaine  abuse  (HCC)    COPD (chronic obstructive pulmonary disease) (HCC)    Depression with anxiety    ETOH abuse    a. 6-12 beers/day   GERD (gastroesophageal reflux disease)    GI bleed    Hemorrhoids    Hyperlipidemia    Hypertension    Ischemic cardiomyopathy    a. 06/2011 Echo: EF 45-50%, No rwma; b. 10/2022 Echo: EF 55-60%, no rwma, nl RV fxn.   Liver cirrhosis, alcoholic (HCC)    Marijuana abuse    Migraines    Noncompliance with immunization regimen    Obesity (BMI 30.0-34.9)    Orthostatic hypertension    midodrine    Portal hypertension (HCC)    s/p TIPS   STEMI (ST elevation myocardial infarction) (HCC) 2012   Tobacco abuse    a. 1/2 ppd x 26 yrs    Past Surgical History:  Procedure Laterality Date   COLONOSCOPY  08/09/2021   Procedure: COLONOSCOPY;  Surgeon: Jinny Carmine, MD;  Location: [facility] ENDOSCOPY;  Service: Endoscopy;;   CORONARY ANGIOPLASTY WITH STENT PLACEMENT     ESOPHAGEAL BANDING  04/08/2023   Procedure: ESOPHAGEAL BANDING;  Surgeon: Eda Iha, MD;  Location: [facility] ENDOSCOPY;  Service: Gastroenterology;;   ESOPHAGOGASTRODUODENOSCOPY N/A 05/30/2022   Procedure: ESOPHAGOGASTRODUODENOSCOPY (EGD);  Surgeon: Unk Corinn Skiff, MD;  Location: [facility] ENDOSCOPY;  Service: Gastroenterology;  Laterality: N/A;   ESOPHAGOGASTRODUODENOSCOPY (EGD) WITH PROPOFOL  N/A 03/01/2021   Procedure: ESOPHAGOGASTRODUODENOSCOPY (EGD) WITH PROPOFOL ;  Surgeon: Unk Corinn Skiff, MD;  Location: [facility];  Service: Endoscopy;  Laterality: N/A;   ESOPHAGOGASTRODUODENOSCOPY (EGD) WITH PROPOFOL  N/A 08/09/2021   Procedure: ESOPHAGOGASTRODUODENOSCOPY (EGD) WITH PROPOFOL ;  Surgeon: Jinny Carmine, MD;  Location: [facility] ENDOSCOPY;  Service: Endoscopy;  Laterality: N/A;   ESOPHAGOGASTRODUODENOSCOPY (EGD) WITH PROPOFOL  N/A 03/01/2023   Procedure: ESOPHAGOGASTRODUODENOSCOPY (EGD) WITH PROPOFOL ;  Surgeon: Onita Elspeth Sharper, DO;  Location: [facility] ENDOSCOPY;  Service: Gastroenterology;  Laterality: N/A;    ESOPHAGOGASTRODUODENOSCOPY (EGD) WITH PROPOFOL  N/A 04/08/2023   Procedure: ESOPHAGOGASTRODUODENOSCOPY (EGD) WITH PROPOFOL ;  Surgeon: Eda Iha, MD;  Location: [facility] ENDOSCOPY;  Service: Gastroenterology;  Laterality: N/A;   ESOPHAGOGASTRODUODENOSCOPY (EGD) WITH PROPOFOL  N/A 08/09/2023   Procedure: ESOPHAGOGASTRODUODENOSCOPY (EGD) WITH PROPOFOL ;  Surgeon: Maryruth Ole DASEN, MD;  Location: [facility] ENDOSCOPY;  Service: Endoscopy;  Laterality: N/A;   ESOPHAGOGASTRODUODENOSCOPY (EGD) WITH PROPOFOL  N/A 08/19/2023   Procedure: ESOPHAGOGASTRODUODENOSCOPY (EGD) WITH PROPOFOL ;  Surgeon: Maryruth Ole DASEN, MD;  Location: [facility] ENDOSCOPY;  Service: Endoscopy;  Laterality: N/A;   EVALUATION UNDER ANESTHESIA WITH HEMORRHOIDECTOMY N/A 10/31/2022   Procedure: EXAM UNDER ANESTHESIA WITH SUTURE LIGATION OF TWO BLEEDING PEDICLES;  Surgeon: Desiderio Schanz, MD;  Location: [facility] ORS;  Service: General;  Laterality: N/A;   FLEXIBLE SIGMOIDOSCOPY N/A 10/02/2022   Procedure: FLEXIBLE SIGMOIDOSCOPY;  Surgeon: Aneita Gwendlyn DASEN, MD;  Location: [facility] ENDOSCOPY;  Service: Gastroenterology;  Laterality: N/A;   HEMOSTASIS CONTROL  08/09/2023   Procedure: HEMOSTASIS CONTROL;  Surgeon: Maryruth Ole DASEN, MD;  Location: [facility] ENDOSCOPY;  Service: Endoscopy;;   IR TIPS  04/11/2023   IR US  GUIDE VASC ACCESS RIGHT  04/11/2023   IR US  GUIDE VASC ACCESS RIGHT  04/11/2023   IR US  GUIDE VASC ACCESS RIGHT  04/11/2023   LEFT HEART CATH AND CORONARY ANGIOGRAPHY N/A 11/04/2022   Procedure: LEFT HEART CATH AND CORONARY ANGIOGRAPHY;  Surgeon: Darron Deatrice LABOR, MD;  Location: [facility] INVASIVE CV LAB;  Service: Cardiovascular;  Laterality: N/A;   RADIOLOGY WITH ANESTHESIA N/A 04/11/2023   Procedure: IR WITH ANESTHESIA;  Surgeon: Johann Sieving, MD;  Location: [facility] OR;  Service: Radiology;  Laterality: N/A;   VISCERAL ANGIOGRAPHY N/A 10/24/2022   Procedure: VISCERAL ANGIOGRAPHY;  Surgeon: Marea Selinda RAMAN, MD;  Location: [facility] INVASIVE CV LAB;  Service:  Cardiovascular;  Laterality: N/A;   VISCERAL ARTERY INTERVENTION N/A 05/09/2023   Procedure: VISCERAL ARTERY INTERVENTION;  Surgeon: Jama Cordella MATSU, MD;  Location: [facility] INVASIVE CV LAB;  Service: Cardiovascular;  Laterality: N/A;    Allergies  Allergen Reactions   Penicillins Hives and Rash    Tolerated Ceftriaxone  10/2022    Outpatient Encounter Medications as of 01/23/2024  Medication Sig   albuterol  (VENTOLIN  HFA) 108 (90 Base) MCG/ACT inhaler Inhale 2 puffs into the lungs every 6 (six) hours as needed for wheezing or shortness of breath.   alum & mag hydroxide-simeth (MAALOX/MYLANTA) 200-200-20 MG/5ML suspension Take 30 mLs by mouth every 6 (six) hours as needed for indigestion or heartburn.   aspirin  EC 81 MG tablet Take 1 tablet (81 mg total) by mouth daily. Swallow whole.   chlorpheniramine-HYDROcodone  (TUSSIONEX) 10-8 MG/5ML Take 5 mLs by mouth every 12 (twelve) hours as needed for cough.   ezetimibe  (ZETIA ) 10 MG tablet Take 1 tablet (10 mg total) by mouth daily.   feeding supplement (ENSURE ENLIVE / ENSURE PLUS) LIQD Take 237 mLs by mouth 2 (two) times daily between meals.   ferrous sulfate  325 (65 FE) MG EC tablet Take 1 tablet (325 mg total) by mouth 2 (two) times daily.   hydrocortisone  (CORTEF ) 20 MG tablet Take 1 tablet (20 mg total) by mouth 2 (two) times daily for 3 days, THEN 1 tablet (20 mg total) daily for 7 days, THEN 0.5 tablets (10 mg total) daily for 7 days.   ipratropium-albuterol  (DUONEB) 0.5-2.5 (3) MG/3ML SOLN Take 3 mLs by nebulization every 2 (two) hours as needed (wheeze, SOB).   magic mouthwash SOLN 12mL Anbesol 30mL Benadryl  30mL Mylanta  5mL swish, gargle & spit q8hr prn throat discomfort   midodrine  (PROAMATINE ) 5 MG tablet Take 1 tablet (5 mg total) by mouth 3 (three) times daily with meals.   Nebulizers (COMPRESSOR/NEBULIZER) MISC Nebulizer + mask/hoses per patient preference and insurance coverage, use as directed   ondansetron  (ZOFRAN -ODT) 4 MG  disintegrating tablet Take 1 tablet (4 mg total) by mouth every 8 (eight) hours as needed for nausea or vomiting.   pantoprazole  (PROTONIX ) 40 MG tablet Take 1 tablet (40 mg total) by mouth 2 (two) times daily.   polyethylene glycol powder (GLYCOLAX /MIRALAX ) 17 GM/SCOOP powder Take 17 g by mouth daily as needed (if less than 3 BMs in past 24 hours).   potassium chloride  SA (KLOR-CON  M) 20 MEQ tablet Take 1 tablet (20 mEq total) by mouth daily.   Respiratory Therapy Supplies (NEBULIZER/TUBING/MOUTHPIECE) KIT As directed   rosuvastatin  (CRESTOR ) 40 MG tablet TAKE 1 TABLET BY MOUTH EVERY DAY   thiamine  (VITAMIN B1) 100 MG tablet Take 1 tablet (100 mg total) by mouth daily.   fluticasone  furoate-vilanterol (BREO ELLIPTA ) 100-25 MCG/ACT AEPB Inhale  1 puff into the lungs daily. (Patient not taking: Reported on 01/23/2024)   gabapentin  (NEURONTIN ) 100 MG capsule Take 1 capsule (100 mg total) by mouth 2 (two) times daily.   No facility-administered encounter medications on file as of 01/23/2024.    Review of Systems:  Review of Systems  Constitutional:  Negative for activity change, appetite change and fever.  HENT:  Negative for sore throat.   Eyes: Negative.   Cardiovascular:  Negative for chest pain and leg swelling.  Gastrointestinal:  Negative for abdominal distention, diarrhea and vomiting.  Genitourinary:  Negative for dysuria, frequency and urgency.  Skin:  Negative for color change.  Neurological:  Negative for dizziness and headaches.  Psychiatric/Behavioral:  Negative for behavioral problems and sleep disturbance. The patient is not nervous/anxious.     Health Maintenance  Topic Date Due   Medicare Annual Wellness (AWV)  Never done   Pneumococcal Vaccine 81-49 Years old (1 of 2 - PCV) Never done   DTaP/Tdap/Td (1 - Tdap) Never done   Zoster Vaccines- Shingrix (1 of 2) Never done   INFLUENZA VACCINE  07/17/2023   COVID-19 Vaccine (1 - 2024-25 season) Never done   Colonoscopy   08/09/2028   Hepatitis C Screening  Completed   HIV Screening  Completed   HPV VACCINES  Aged Out   Lung Cancer Screening  Discontinued    Physical Exam: Vitals:   01/23/24 1013  BP: 124/78  Pulse: 95  Resp: 18  Temp: 98.7 F (37.1 C)  SpO2: 93%  Weight: 193 lb 3.2 oz (87.6 kg)  Height: 5' 5 (1.651 m)   Body mass index is 32.15 kg/m. Physical Exam Constitutional:      Appearance: He is obese.  HENT:     Head: Normocephalic and atraumatic.     Mouth/Throat:     Mouth: Mucous membranes are moist.  Eyes:     Conjunctiva/sclera: Conjunctivae normal.  Cardiovascular:     Rate and Rhythm: Normal rate and regular rhythm.     Pulses: Normal pulses.     Heart sounds: Normal heart sounds.  Pulmonary:     Effort: Pulmonary effort is normal.     Breath sounds: Normal breath sounds.  Abdominal:     General: Bowel sounds are normal.     Palpations: Abdomen is soft.  Musculoskeletal:        General: No swelling. Normal range of motion.     Cervical back: Normal range of motion.  Skin:    General: Skin is warm and dry.  Neurological:     General: No focal deficit present.     Mental Status: He is alert and oriented to person, place, and time.  Psychiatric:        Mood and Affect: Mood normal.        Behavior: Behavior normal.        Thought Content: Thought content normal.        Judgment: Judgment normal.     Labs reviewed: Basic Metabolic Panel: Recent Labs    08/08/23 1433 08/09/23 0300 01/11/24 0308 01/12/24 0506 01/13/24 0627 01/18/24 1253  NA 134*   < > 135 133* 133* 143  K 2.8*   < > 3.6 3.4* 3.9 3.3*  CL 99   < > 103 103 105 111  CO2 22   < > 22 23 23 23   GLUCOSE 102*   < > 154* 134* 133* 140*  BUN 9   < > 19 15 15  7  CREATININE 0.64   < > 0.77 0.58* 0.60* 0.59*  CALCIUM  8.4*   < > 7.6* 7.9* 8.1* 8.5*  MG  --    < > 1.4* 2.4 2.1 1.7  PHOS  --    < > 3.2 2.6 2.5  --   TSH 0.516  --   --   --   --   --    < > = values in this interval not displayed.    Liver Function Tests: Recent Labs    01/09/24 0513 01/10/24 1700 01/18/24 1253  AST 56* 68* 53*  ALT 21 20 31   ALKPHOS 129* 132* 141*  BILITOT 4.2* 4.3* 2.2*  PROT 6.6 6.8 6.6  ALBUMIN  2.8* 2.9* 2.6*   Recent Labs    04/17/23 0502 05/04/23 1947 10/27/23 0633 11/14/23 0452 01/03/24 0110  LIPASE 27   < > 46 46 49  AMYLASE 29  --   --   --   --    < > = values in this interval not displayed.   Recent Labs    08/23/23 0842 08/24/23 0507 09/03/23 0445  AMMONIA 58* 43* 50*   CBC: Recent Labs    10/16/23 0428 10/17/23 0643 11/14/23 0452 11/22/23 2229 01/10/24 1700 01/12/24 0506 01/18/24 1253  WBC 5.6   < > 3.0*   < > 6.0 4.4 9.0  NEUTROABS 3.2  --  2.0  --   --   --  5.4  HGB 10.1*   < > 9.7*   < > 9.1* 8.7* 9.4*  HCT 30.6*   < > 29.6*   < > 27.0* 26.4* 29.5*  MCV 86.0   < > 90.5   < > 90.0 91.0 91.3  PLT 97*   < > 90*   < > 84* 77* 134*   < > = values in this interval not displayed.   Lipid Panel: Recent Labs    02/22/23 0450 04/09/23 0533 04/15/23 0453 05/05/23 0131 12/14/23 0359  CHOL 100  --   --  135 165  HDL 27*  --   --  15* 27*  LDLCALC 62  --   --  100* 126*  TRIG 53   < > 124 98 62  CHOLHDL 3.7  --   --  9.0 6.1   < > = values in this interval not displayed.   Lab Results  Component Value Date   HGBA1C 5.2 05/04/2023    Procedures since last visit: CT Cervical Spine Wo Contrast Result Date: 01/18/2024 CLINICAL DATA:  Neck trauma, intoxicated or obtunded (Age >= 16y) EXAM: CT CERVICAL SPINE WITHOUT CONTRAST TECHNIQUE: Multidetector CT imaging of the cervical spine was performed without intravenous contrast. Multiplanar CT image reconstructions were also generated. RADIATION DOSE REDUCTION: This exam was performed according to the departmental dose-optimization program which includes automated exposure control, adjustment of the mA and/or kV according to patient size and/or use of iterative reconstruction technique. COMPARISON:  03/07/2011  FINDINGS: Alignment: Facet joints are aligned without dislocation or traumatic listhesis. Dens and lateral masses are aligned. Straightening of the cervical lordosis. Skull base and vertebrae: No acute fracture. No primary bone lesion or focal pathologic process. Soft tissues and spinal canal: No prevertebral fluid or swelling. No visible canal hematoma. Disc levels:  Degenerative disc disease of C5-C6. Upper chest: Negative. Other: Bilateral carotid atherosclerosis. IMPRESSION: 1. No acute fracture or traumatic listhesis of the cervical spine. 2. Degenerative disc disease of C5-C6. Electronically Signed   By: Mabel Converse  D.O.   On: 01/18/2024 13:35   CT Head Wo Contrast Result Date: 01/18/2024 CLINICAL DATA:  Head trauma, abnormal mental status (Age 28-64y) EXAM: CT HEAD WITHOUT CONTRAST TECHNIQUE: Contiguous axial images were obtained from the base of the skull through the vertex without intravenous contrast. RADIATION DOSE REDUCTION: This exam was performed according to the departmental dose-optimization program which includes automated exposure control, adjustment of the mA and/or kV according to patient size and/or use of iterative reconstruction technique. COMPARISON:  06/30/2023 FINDINGS: Brain: No evidence of acute infarction, hemorrhage, hydrocephalus, extra-axial collection or mass lesion/mass effect. Scattered low-density changes within the periventricular and subcortical white matter most compatible with chronic microvascular ischemic change. Mild diffuse cerebral volume loss. Vascular: Atherosclerotic calcifications involving the large vessels of the skull base. No unexpected hyperdense vessel. Skull: Normal. Negative for fracture or focal lesion. Sinuses/Orbits: No acute finding. Other: None. IMPRESSION: 1. No acute intracranial findings. 2. Mild chronic microvascular ischemic change and cerebral volume loss. Electronically Signed   By: Mabel Converse D.O.   On: 01/18/2024 13:30   DG Chest  Port 1 View Result Date: 01/10/2024 CLINICAL DATA:  Shortness of breath. EXAM: PORTABLE CHEST 1 VIEW COMPARISON:  01/07/2024 FINDINGS: Stable heart size and mediastinal contours. No focal airspace disease, pulmonary edema, pleural effusion or pneumothorax. On limited assessment, no acute osseous findings. IMPRESSION: No acute chest findings. Electronically Signed   By: Andrea Gasman M.D.   On: 01/10/2024 17:17   DG Chest 1 View Result Date: 01/07/2024 CLINICAL DATA:  Cough and generalized body aches EXAM: CHEST  1 VIEW COMPARISON:  01/03/2024 FINDINGS: Stable cardiomediastinal silhouette. No focal consolidation, pleural effusion, or pneumothorax. No displaced rib fractures. IMPRESSION: No active disease. Electronically Signed   By: Norman Gatlin M.D.   On: 01/07/2024 01:07   CT Angio Chest/Abd/Pel for Dissection W and/or Wo Contrast Result Date: 01/03/2024 CLINICAL DATA:  53 year old male with history of epigastric pain radiating to the back and chest. Suspected aortic aneurism. EXAM: CT ANGIOGRAPHY CHEST, ABDOMEN AND PELVIS TECHNIQUE: Non-contrast CT of the chest was initially obtained. Multidetector CT imaging through the chest, abdomen and pelvis was performed using the standard protocol during bolus administration of intravenous contrast. Multiplanar reconstructed images and MIPs were obtained and reviewed to evaluate the vascular anatomy. RADIATION DOSE REDUCTION: This exam was performed according to the departmental dose-optimization program which includes automated exposure control, adjustment of the mA and/or kV according to patient size and/or use of iterative reconstruction technique. CONTRAST:  OMNIPAQUE  IOHEXOL  350 MG/ML SOLN COMPARISON:  Chest CTA 12/12/2023. CTA of the chest, abdomen and pelvis 11/14/2023. FINDINGS: CTA CHEST FINDINGS Cardiovascular: Precontrast images demonstrate no crescentic high attenuation associated with the wall of the thoracic aorta to suggest the presence of  acute intramural hemorrhage. Postcontrast images demonstrate no evidence of thoracic aortic aneurysm or dissection. Thoracic aorta is normal in caliber measuring 3.1 cm, 2.4 cm and 2.0 cm in diameter in the ascending, mid aortic arch and descending thoracic aorta respectively. Dilatation of the pulmonic trunk (3.7 cm in diameter). There is aortic atherosclerosis, as well as atherosclerosis of the great vessels of the mediastinum and the coronary arteries, including calcified atherosclerotic plaque in the left main and left anterior descending coronary arteries. Mediastinum/Nodes: No pathologically enlarged mediastinal or hilar lymph nodes. Hilar esophagus is unremarkable in appearance. No axillary lymphadenopathy. Lungs/Pleura: 5 mm subpleural nodule in the right middle lobe (axial image 83 of series 6), stable dating back to at least 05/09/2023, nonspecific, but statistically likely  a benign subpleural lymph node (no imaging follow-up recommended). No other larger more suspicious appearing pulmonary nodules or masses are noted. No acute consolidative airspace disease. No pleural effusions. Musculoskeletal: There are no aggressive appearing lytic or blastic lesions noted in the visualized portions of the skeleton. Review of the MIP images confirms the above findings. CTA ABDOMEN AND PELVIS FINDINGS VASCULAR Aorta: Aortic atherosclerosis. Normal caliber aorta without aneurysm, dissection, vasculitis or significant stenosis. Celiac: Stent in the proximal celiac axis, patent. Patent without evidence of aneurysm, dissection, vasculitis or significant stenosis. SMA: 2 stents in the proximal superior mesenteric artery, patent. Patent without evidence of aneurysm, dissection, vasculitis or significant stenosis. Renals: Single right and 2 left renal arteries (small accessory branch to the left lower pole incidentally noted). All renal arteries are patent without evidence of aneurysm, dissection, vasculitis, fibromuscular  dysplasia or significant stenosis. IMA: Patent without evidence of aneurysm, dissection, vasculitis or significant stenosis. Inflow: Patent without evidence of aneurysm, dissection, vasculitis or significant stenosis. Veins: No obvious venous abnormality within the limitations of this arterial phase study. Review of the MIP images confirms the above findings. NON-VASCULAR Hepatobiliary: Liver has a shrunken appearance and nodular contour, indicative of underlying cirrhosis. TIPS noted, patency of which is not assessable on today's arterial phase examination. No definite suspicious cystic or solid hepatic lesions. No intra or extrahepatic biliary ductal dilatation. Gallbladder is unremarkable in appearance. Pancreas: No pancreatic mass. No pancreatic ductal dilatation. No pancreatic or peripancreatic fluid collections or inflammatory changes. Spleen: Unremarkable. Adrenals/Urinary Tract: Bilateral kidneys and bilateral adrenal glands are normal in appearance. No hydroureteronephrosis. Urinary bladder is unremarkable in appearance. Stomach/Bowel: The appearance of the stomach is normal. No pathologic dilatation of small bowel or colon. The appendix is not confidently identified and may be surgically absent. Regardless, there are no inflammatory changes noted adjacent to the cecum to suggest the presence of an acute appendicitis at this time. Lymphatic: No lymphadenopathy noted in the abdomen or pelvis. Reproductive: Prostate gland and seminal vesicles are unremarkable in appearance. Other: No significant volume of ascites.  No pneumoperitoneum. Musculoskeletal: There are no aggressive appearing lytic or blastic lesions noted in the visualized portions of the skeleton. Review of the MIP images confirms the above findings. IMPRESSION: 1. No acute findings are noted in the chest, abdomen or pelvis to account for the patient's symptoms. Specifically, no evidence of thoracic or abdominal aortic aneurysm or dissection. 2.  Aortic atherosclerosis, in addition to left main and left anterior descending coronary artery disease. 3. Dilatation of the pulmonic trunk (3.7 cm in diameter), suggesting pulmonary arterial hypertension. 4. Cirrhosis with TIPS, patency of which is not assessable on today's arterial phase examination. No significant volume of ascites. Aortic Atherosclerosis (ICD10-I70.0). Electronically Signed   By: Toribio Aye M.D.   On: 01/03/2024 05:34   DG Chest 2 View Result Date: 01/03/2024 CLINICAL DATA:  Chest pain EXAM: CHEST - 2 VIEW COMPARISON:  None Available. FINDINGS: Heart and mediastinal contours are within normal limits. No focal opacities or effusions. No acute bony abnormality. IMPRESSION: No active cardiopulmonary disease. Electronically Signed   By: Franky Crease M.D.   On: 01/03/2024 01:51   DG Chest 2 View Result Date: 01/02/2024 CLINICAL DATA:  Chest pain EXAM: CHEST - 2 VIEW COMPARISON:  12/31/2023 FINDINGS: Lungs are clear.  No pleural effusion or pneumothorax. The heart is normal in size. Visualized osseous structures are within normal limits. IMPRESSION: Normal chest radiographs. Electronically Signed   By: Pinkie Pebbles M.D.   On:  01/02/2024 04:00   DG Chest Port 1 View Result Date: 12/31/2023 CLINICAL DATA:  Chest pain that radiates to the back. EXAM: PORTABLE CHEST 1 VIEW COMPARISON:  December 12, 2023 FINDINGS: The cardiac silhouette is mildly enlarged and unchanged in size. There is no evidence of an acute infiltrate, pleural effusion or pneumothorax. The visualized skeletal structures are unremarkable. IMPRESSION: No active cardiopulmonary disease. Electronically Signed   By: Suzen Dials M.D.   On: 12/31/2023 22:28    Assessment/Plan  1. Depression with anxiety (Primary) -  PHQ score of 24 indicating severe depression. Patient reports not currently receiving any medication for depression. -Start Sertraline  50mg  daily. - sertraline  (ZOLOFT ) 50 MG tablet; Take 1 tablet  (50 mg total) by mouth daily.  Dispense: 90 tablet; Refill: 3 - Basic Metabolic Panel with eGFR - QUEtiapine  (SEROQUEL ) 25 MG tablet; Take 1 tablet (25 mg total) by mouth at bedtime.  Dispense: 90 tablet; Refill: 1  2. Neuropathy -  Patient reports chronic burning pain in feet, rating it as 9/10 in severity. Patient has been off Gabapentin  for approximately two months. -Resume Gabapentin  100mg  twice daily. - gabapentin  (NEURONTIN ) 100 MG capsule; Take 1 capsule (100 mg total) by mouth 2 (two) times daily.  Dispense: 180 capsule; Refill: 1 - thiamine  (VITAMIN B1) 100 MG tablet; Take 1 tablet (100 mg total) by mouth daily.  Dispense: 90 tablet; Refill: 1  3. Hypokalemia -  Low potassium levels noted in recent lab work. -Order blood work to recheck potassium levels. - potassium chloride  SA (KLOR-CON  M) 20 MEQ tablet; Take 1 tablet (20 mEq total) by mouth daily.  Dispense: 90 tablet; Refill: 0  4. Elevated LDL cholesterol level -   Patient has a history of elevated LDL and is not currently taking Zetia . -Resume Zetia . -Order lipid panel to assess current cholesterol levels. - Lipid panel - ezetimibe  (ZETIA ) 10 MG tablet; Take 1 tablet (10 mg total) by mouth daily.  Dispense: 90 tablet; Refill: 1 - rosuvastatin  (CRESTOR ) 40 MG tablet; Take 1 tablet (40 mg total) by mouth daily.  Dispense: 90 tablet; Refill: 1  5. Iron  deficiency anemia, unspecified iron  deficiency anemia type -  Patient has a history of anemia and is not currently taking iron  supplements. -Resume Ferrous Sulfate  twice daily. -Order CBC to assess current hemoglobin levels. - ferrous sulfate  325 (65 FE) MG EC tablet; Take 1 tablet (325 mg total) by mouth 2 (two) times daily.  Dispense: 180 tablet; Refill: 3  6. Chronic obstructive pulmonary disease, unspecified COPD type (HCC) -  Patient has a history of COPD and is not currently taking Breo Ellipta  or Albuterol . -Resume Albuterol  as needed for COPD symptoms. - albuterol   (VENTOLIN  HFA) 108 (90 Base) MCG/ACT inhaler; Inhale 2 puffs into the lungs every 6 (six) hours as needed for wheezing or shortness of breath.  Dispense: 8 g; Refill: 0  7. History of GI bleed -  Patient reports history of GI bleed and recent blood in stool. Patient is not currently taking Pantoprazole . -Resume Pantoprazole  40mg  twice daily to prevent further GI bleeding. -Refer to Gastroenterology for further evaluation. - CBC with Differential/Platelets - Ambulatory referral to Gastroenterology - pantoprazole  (PROTONIX ) 40 MG tablet; Take 1 tablet (40 mg total) by mouth 2 (two) times daily.  Dispense: 120 tablet; Refill: 1  8. Chronic pain syndrome -  has burning pain on feet -  continue Gabapentin  - Ambulatory referral to Pain Clinic  9. Blurry vision -  denies eye pain - Ambulatory  referral to Ophthalmology  10. Alcohol  use -  Patient reports regular alcohol  consumption. -Encourage reduction and eventual cessation of alcohol  use.  11. Tobacco use -  Patient reports smoking 3-4 cigarettes per day. -Encourage continued efforts to quit smoking.  12. Insomnia -  Patient reports difficulty sleeping and has a history of taking Seroquel  for sleep. -Resume Seroquel  at bedtime.    Follow-up in 2 weeks to assess response to medication changes and review results of blood work.    Labs/tests ordered:  CBC, BMP, lipid panel     Deyci Gesell Medina-Vargas, NP

## 2024-01-23 NOTE — Telephone Encounter (Signed)
 Lvm to r/s appt n/s 12/24/23

## 2024-01-24 LAB — CBC WITH DIFFERENTIAL/PLATELET
Absolute Lymphocytes: 1771 {cells}/uL (ref 850–3900)
Absolute Monocytes: 464 {cells}/uL (ref 200–950)
Basophils Absolute: 53 {cells}/uL (ref 0–200)
Basophils Relative: 0.7 %
Eosinophils Absolute: 122 {cells}/uL (ref 15–500)
Eosinophils Relative: 1.6 %
HCT: 29 % — ABNORMAL LOW (ref 38.5–50.0)
Hemoglobin: 9.6 g/dL — ABNORMAL LOW (ref 13.2–17.1)
MCH: 28.5 pg (ref 27.0–33.0)
MCHC: 33.1 g/dL (ref 32.0–36.0)
MCV: 86.1 fL (ref 80.0–100.0)
MPV: 10.2 fL (ref 7.5–12.5)
Monocytes Relative: 6.1 %
Neutro Abs: 5191 {cells}/uL (ref 1500–7800)
Neutrophils Relative %: 68.3 %
Platelets: 153 10*3/uL (ref 140–400)
RBC: 3.37 10*6/uL — ABNORMAL LOW (ref 4.20–5.80)
RDW: 16.7 % — ABNORMAL HIGH (ref 11.0–15.0)
Total Lymphocyte: 23.3 %
WBC: 7.6 10*3/uL (ref 3.8–10.8)

## 2024-01-24 LAB — LIPID PANEL
Cholesterol: 171 mg/dL (ref <200)
HDL: 39 mg/dL — ABNORMAL LOW (ref >=40)
LDL Cholesterol (Calc): 109 mg/dL — ABNORMAL HIGH
Non-HDL Cholesterol (Calc): 132 mg/dL — ABNORMAL HIGH (ref <130)
Total CHOL/HDL Ratio: 4.4 (calc) (ref <5.0)
Triglycerides: 120 mg/dL (ref <150)

## 2024-01-24 LAB — BASIC METABOLIC PANEL WITH GFR
BUN/Creatinine Ratio: 6 (calc) (ref 6–22)
BUN: 3 mg/dL — ABNORMAL LOW (ref 7–25)
CO2: 22 mmol/L (ref 20–32)
Calcium: 8.7 mg/dL (ref 8.6–10.3)
Chloride: 110 mmol/L (ref 98–110)
Creat: 0.52 mg/dL — ABNORMAL LOW (ref 0.70–1.30)
Glucose, Bld: 92 mg/dL (ref 65–99)
Potassium: 3.6 mmol/L (ref 3.5–5.3)
Sodium: 141 mmol/L (ref 135–146)
eGFR: 122 mL/min/{1.73_m2} (ref >=60)

## 2024-01-26 ENCOUNTER — Other Ambulatory Visit: Payer: Self-pay

## 2024-01-26 MED ORDER — HYDROCORTISONE 20 MG PO TABS: ORAL_TABLET | ORAL | 0 refills | Status: AC

## 2024-01-26 NOTE — Progress Notes (Signed)
-   hgb 9.6, up from 9.4 (01/18/24 -  electrolytes and kidney function normal -  LDL 109, elevated (normal <119 ), continue Zetia

## 2024-02-03 ENCOUNTER — Telehealth: Payer: Self-pay

## 2024-02-03 ENCOUNTER — Other Ambulatory Visit: Payer: Self-pay | Admitting: Adult Health

## 2024-02-03 DIAGNOSIS — G894 Chronic pain syndrome: Secondary | ICD-10-CM

## 2024-02-03 MED ORDER — METHOCARBAMOL 500 MG PO TABS: 250.0000 mg | ORAL_TABLET | Freq: Every evening | ORAL | 0 refills | Status: AC | PRN

## 2024-02-03 NOTE — Telephone Encounter (Signed)
Patient called stating that he needs a refill on his muscle relaxer, methocarbamol 750 mg, 1 by mouth at bedtime. Patient is aware that this medication is no longer on his active medication list and would need to be authorized by Medina-Vargas, Margit Banda, NP   FYI- patient turned away from 2 pain clinics    Pharmacy- [facility], [place]

## 2024-02-03 NOTE — Telephone Encounter (Signed)
Sent eRx for Methocarbamol 250 mg PO Q HS PRN X 10 days. Sent another referral for external pain clinic.

## 2024-02-03 NOTE — Telephone Encounter (Signed)
LMOM that Monina sent a Rx to his pharmacy.

## 2024-02-04 ENCOUNTER — Telehealth: Payer: Self-pay | Admitting: Family

## 2024-02-04 NOTE — Telephone Encounter (Signed)
Called and spoke with patient about his missed initial HF appointment on 12/24/23. Reviewed what we do and offered to schedule an appointment in the next couple of weeks. He said that his brother provides transportation for him so he will have to check with him. He says that he will call his brother and see if he's available and then will call us back to schedule something. Will also send a secure chat to his PCP to discuss with patient further as he has upcoming PCP appt next week.

## 2024-02-06 ENCOUNTER — Telehealth: Payer: Self-pay | Admitting: Gastroenterology

## 2024-02-06 NOTE — Telephone Encounter (Signed)
Ethan Wells called in to schedule the patient for his appointment. I got permission from the patient to speak to [facility] to schedule his appointment.

## 2024-02-08 ENCOUNTER — Emergency Department: Payer: Medicare HMO

## 2024-02-08 ENCOUNTER — Other Ambulatory Visit: Payer: Self-pay

## 2024-02-08 DIAGNOSIS — F32A Depression, unspecified: Secondary | ICD-10-CM | POA: Diagnosis present

## 2024-02-08 DIAGNOSIS — E66811 Obesity, class 1: Secondary | ICD-10-CM | POA: Diagnosis present

## 2024-02-08 DIAGNOSIS — Z833 Family history of diabetes mellitus: Secondary | ICD-10-CM

## 2024-02-08 DIAGNOSIS — R34 Anuria and oliguria: Secondary | ICD-10-CM | POA: Diagnosis not present

## 2024-02-08 DIAGNOSIS — R0602 Shortness of breath: Secondary | ICD-10-CM | POA: Diagnosis not present

## 2024-02-08 DIAGNOSIS — I472 Ventricular tachycardia, unspecified: Secondary | ICD-10-CM | POA: Diagnosis not present

## 2024-02-08 DIAGNOSIS — I9589 Other hypotension: Secondary | ICD-10-CM | POA: Diagnosis present

## 2024-02-08 DIAGNOSIS — E785 Hyperlipidemia, unspecified: Secondary | ICD-10-CM | POA: Diagnosis present

## 2024-02-08 DIAGNOSIS — Z8249 Family history of ischemic heart disease and other diseases of the circulatory system: Secondary | ICD-10-CM

## 2024-02-08 DIAGNOSIS — R531 Weakness: Secondary | ICD-10-CM | POA: Diagnosis present

## 2024-02-08 DIAGNOSIS — K766 Portal hypertension: Secondary | ICD-10-CM | POA: Diagnosis present

## 2024-02-08 DIAGNOSIS — F17210 Nicotine dependence, cigarettes, uncomplicated: Secondary | ICD-10-CM | POA: Diagnosis present

## 2024-02-08 DIAGNOSIS — R079 Chest pain, unspecified: Principal | ICD-10-CM

## 2024-02-08 DIAGNOSIS — F10220 Alcohol dependence with intoxication, uncomplicated: Secondary | ICD-10-CM | POA: Diagnosis present

## 2024-02-08 DIAGNOSIS — I110 Hypertensive heart disease with heart failure: Secondary | ICD-10-CM | POA: Diagnosis present

## 2024-02-08 DIAGNOSIS — F1410 Cocaine abuse, uncomplicated: Secondary | ICD-10-CM | POA: Diagnosis present

## 2024-02-08 DIAGNOSIS — Z7982 Long term (current) use of aspirin: Secondary | ICD-10-CM

## 2024-02-08 DIAGNOSIS — Y908 Blood alcohol level of 240 mg/100 ml or more: Secondary | ICD-10-CM | POA: Diagnosis present

## 2024-02-08 DIAGNOSIS — E876 Hypokalemia: Secondary | ICD-10-CM | POA: Diagnosis present

## 2024-02-08 DIAGNOSIS — I480 Paroxysmal atrial fibrillation: Secondary | ICD-10-CM | POA: Diagnosis present

## 2024-02-08 DIAGNOSIS — K551 Chronic vascular disorders of intestine: Secondary | ICD-10-CM | POA: Diagnosis present

## 2024-02-08 DIAGNOSIS — Z880 Allergy status to penicillin: Secondary | ICD-10-CM

## 2024-02-08 DIAGNOSIS — D61818 Other pancytopenia: Secondary | ICD-10-CM | POA: Diagnosis present

## 2024-02-08 DIAGNOSIS — I700 Atherosclerosis of aorta: Secondary | ICD-10-CM | POA: Diagnosis not present

## 2024-02-08 DIAGNOSIS — F1411 Cocaine abuse, in remission: Secondary | ICD-10-CM | POA: Diagnosis not present

## 2024-02-08 DIAGNOSIS — F10239 Alcohol dependence with withdrawal, unspecified: Secondary | ICD-10-CM | POA: Diagnosis not present

## 2024-02-08 DIAGNOSIS — Z823 Family history of stroke: Secondary | ICD-10-CM

## 2024-02-08 DIAGNOSIS — R002 Palpitations: Secondary | ICD-10-CM | POA: Diagnosis present

## 2024-02-08 DIAGNOSIS — I5032 Chronic diastolic (congestive) heart failure: Secondary | ICD-10-CM | POA: Diagnosis present

## 2024-02-08 DIAGNOSIS — G629 Polyneuropathy, unspecified: Secondary | ICD-10-CM | POA: Diagnosis present

## 2024-02-08 DIAGNOSIS — I4901 Ventricular fibrillation: Secondary | ICD-10-CM | POA: Diagnosis not present

## 2024-02-08 DIAGNOSIS — G928 Other toxic encephalopathy: Secondary | ICD-10-CM | POA: Diagnosis not present

## 2024-02-08 DIAGNOSIS — Z6832 Body mass index (BMI) 32.0-32.9, adult: Secondary | ICD-10-CM | POA: Diagnosis not present

## 2024-02-08 DIAGNOSIS — D500 Iron deficiency anemia secondary to blood loss (chronic): Secondary | ICD-10-CM | POA: Diagnosis not present

## 2024-02-08 DIAGNOSIS — I2510 Atherosclerotic heart disease of native coronary artery without angina pectoris: Secondary | ICD-10-CM | POA: Diagnosis present

## 2024-02-08 DIAGNOSIS — E875 Hyperkalemia: Secondary | ICD-10-CM | POA: Diagnosis present

## 2024-02-08 DIAGNOSIS — Z91148 Patient's other noncompliance with medication regimen for other reason: Secondary | ICD-10-CM

## 2024-02-08 DIAGNOSIS — R112 Nausea with vomiting, unspecified: Secondary | ICD-10-CM | POA: Diagnosis present

## 2024-02-08 DIAGNOSIS — K921 Melena: Secondary | ICD-10-CM | POA: Diagnosis present

## 2024-02-08 DIAGNOSIS — R0789 Other chest pain: Secondary | ICD-10-CM | POA: Diagnosis not present

## 2024-02-08 DIAGNOSIS — K7030 Alcoholic cirrhosis of liver without ascites: Secondary | ICD-10-CM | POA: Diagnosis present

## 2024-02-08 DIAGNOSIS — I255 Ischemic cardiomyopathy: Secondary | ICD-10-CM | POA: Diagnosis present

## 2024-02-08 DIAGNOSIS — G8929 Other chronic pain: Secondary | ICD-10-CM | POA: Diagnosis present

## 2024-02-08 DIAGNOSIS — K7031 Alcoholic cirrhosis of liver with ascites: Secondary | ICD-10-CM | POA: Diagnosis not present

## 2024-02-08 DIAGNOSIS — K219 Gastro-esophageal reflux disease without esophagitis: Secondary | ICD-10-CM | POA: Diagnosis not present

## 2024-02-08 DIAGNOSIS — I214 Non-ST elevation (NSTEMI) myocardial infarction: Secondary | ICD-10-CM | POA: Diagnosis present

## 2024-02-08 DIAGNOSIS — T50916A Underdosing of multiple unspecified drugs, medicaments and biological substances, initial encounter: Secondary | ICD-10-CM | POA: Diagnosis present

## 2024-02-08 DIAGNOSIS — E872 Acidosis, unspecified: Secondary | ICD-10-CM | POA: Diagnosis present

## 2024-02-08 DIAGNOSIS — F10920 Alcohol use, unspecified with intoxication, uncomplicated: Secondary | ICD-10-CM

## 2024-02-08 DIAGNOSIS — K649 Unspecified hemorrhoids: Secondary | ICD-10-CM | POA: Diagnosis present

## 2024-02-08 DIAGNOSIS — Z955 Presence of coronary angioplasty implant and graft: Secondary | ICD-10-CM

## 2024-02-08 DIAGNOSIS — I951 Orthostatic hypotension: Secondary | ICD-10-CM | POA: Diagnosis present

## 2024-02-08 DIAGNOSIS — N179 Acute kidney failure, unspecified: Secondary | ICD-10-CM | POA: Diagnosis not present

## 2024-02-08 DIAGNOSIS — I252 Old myocardial infarction: Secondary | ICD-10-CM

## 2024-02-08 DIAGNOSIS — R072 Precordial pain: Secondary | ICD-10-CM | POA: Diagnosis not present

## 2024-02-08 DIAGNOSIS — R410 Disorientation, unspecified: Secondary | ICD-10-CM

## 2024-02-08 DIAGNOSIS — I959 Hypotension, unspecified: Secondary | ICD-10-CM | POA: Diagnosis not present

## 2024-02-08 DIAGNOSIS — Z886 Allergy status to analgesic agent status: Secondary | ICD-10-CM

## 2024-02-08 DIAGNOSIS — Z79899 Other long term (current) drug therapy: Secondary | ICD-10-CM

## 2024-02-08 DIAGNOSIS — F10129 Alcohol abuse with intoxication, unspecified: Secondary | ICD-10-CM | POA: Diagnosis not present

## 2024-02-08 HISTORY — DX: Acute myocardial infarction, unspecified: I21.9

## 2024-02-08 LAB — CBC WITH DIFFERENTIAL/PLATELET
Abs Immature Granulocytes: 0.01 10*3/uL (ref 0.00–0.07)
Basophils Absolute: 0.1 10*3/uL (ref 0.0–0.1)
Basophils Relative: 1 %
Eosinophils Absolute: 0.1 10*3/uL (ref 0.0–0.5)
Eosinophils Relative: 1 %
HCT: 27.8 % — ABNORMAL LOW (ref 39.0–52.0)
Hemoglobin: 9.1 g/dL — ABNORMAL LOW (ref 13.0–17.0)
Immature Granulocytes: 0 %
Lymphocytes Relative: 21 %
Lymphs Abs: 1.1 10*3/uL (ref 0.7–4.0)
MCH: 28.9 pg (ref 26.0–34.0)
MCHC: 32.7 g/dL (ref 30.0–36.0)
MCV: 88.3 fL (ref 80.0–100.0)
Monocytes Absolute: 0.5 10*3/uL (ref 0.1–1.0)
Monocytes Relative: 9 %
Neutro Abs: 3.6 10*3/uL (ref 1.7–7.7)
Neutrophils Relative %: 68 %
Platelets: 105 10*3/uL — ABNORMAL LOW (ref 150–400)
RBC: 3.15 MIL/uL — ABNORMAL LOW (ref 4.22–5.81)
RDW: 20 % — ABNORMAL HIGH (ref 11.5–15.5)
WBC: 5.4 10*3/uL (ref 4.0–10.5)
nRBC: 0 % (ref 0.0–0.2)

## 2024-02-08 LAB — APTT: aPTT: 46 s — ABNORMAL HIGH (ref 24–36)

## 2024-02-08 LAB — PROTIME-INR
INR: 1.5 — ABNORMAL HIGH (ref 0.8–1.2)
INR: 1.4 — ABNORMAL HIGH (ref 0.8–1.2)
Prothrombin Time: 17.9 s — ABNORMAL HIGH (ref 11.4–15.2)
Prothrombin Time: 17.3 s — ABNORMAL HIGH (ref 11.4–15.2)

## 2024-02-08 LAB — URINE DRUG SCREEN, QUALITATIVE (ARMC ONLY)
Amphetamines, Ur Screen: NOT DETECTED
Barbiturates, Ur Screen: NOT DETECTED
Benzodiazepine, Ur Scrn: NOT DETECTED
Cannabinoid 50 Ng, Ur ~~LOC~~: NOT DETECTED
Cocaine Metabolite,Ur ~~LOC~~: NOT DETECTED
MDMA (Ecstasy)Ur Screen: NOT DETECTED
Methadone Scn, Ur: NOT DETECTED
Opiate, Ur Screen: NOT DETECTED
Phencyclidine (PCP) Ur S: NOT DETECTED
Tricyclic, Ur Screen: NOT DETECTED

## 2024-02-08 LAB — COMPREHENSIVE METABOLIC PANEL
ALT: 13 U/L (ref 0–44)
AST: 81 U/L — ABNORMAL HIGH (ref 15–41)
Albumin: 2.9 g/dL — ABNORMAL LOW (ref 3.5–5.0)
Alkaline Phosphatase: 254 U/L — ABNORMAL HIGH (ref 38–126)
Anion gap: 15 (ref 5–15)
BUN: 5 mg/dL — ABNORMAL LOW (ref 6–20)
CO2: 18 mmol/L — ABNORMAL LOW (ref 22–32)
Calcium: 8.9 mg/dL (ref 8.9–10.3)
Chloride: 106 mmol/L (ref 98–111)
Creatinine, Ser: 0.73 mg/dL (ref 0.61–1.24)
GFR, Estimated: >60 mL/min (ref >60)
Glucose, Bld: 186 mg/dL — ABNORMAL HIGH (ref 70–99)
Potassium: 3.2 mmol/L — ABNORMAL LOW (ref 3.5–5.1)
Sodium: 139 mmol/L (ref 135–145)
Total Bilirubin: 3.6 mg/dL — ABNORMAL HIGH (ref 0.0–1.2)
Total Protein: 7.7 g/dL (ref 6.5–8.1)

## 2024-02-08 LAB — TROPONIN I (HIGH SENSITIVITY)
Troponin I (High Sensitivity): 19 ng/L — ABNORMAL HIGH (ref <18)
Troponin I (High Sensitivity): 90 ng/L — ABNORMAL HIGH (ref <18)
Troponin I (High Sensitivity): 159 ng/L (ref <18)

## 2024-02-08 LAB — MAGNESIUM: Magnesium: 1.5 mg/dL — ABNORMAL LOW (ref 1.7–2.4)

## 2024-02-08 LAB — ETHANOL: Alcohol, Ethyl (B): 342 mg/dL (ref <10)

## 2024-02-08 MED ORDER — LORAZEPAM 2 MG/ML IJ SOLN
0.0000 mg | Freq: Two times a day (BID) | INTRAMUSCULAR | Status: AC
Start: 2024-02-10 — End: 2024-02-12

## 2024-02-08 MED ORDER — HYDROMORPHONE HCL 1 MG/ML IJ SOLN
1.0000 mg | Freq: Once | INTRAMUSCULAR | Status: AC
  Administered 2024-02-08: 1 mg via INTRAVENOUS
  Filled 2024-02-08: qty 1

## 2024-02-08 MED ORDER — LORAZEPAM 2 MG/ML IJ SOLN
0.0000 mg | Freq: Four times a day (QID) | INTRAMUSCULAR | Status: AC
  Filled 2024-02-08: qty 1

## 2024-02-08 MED ORDER — ONDANSETRON HCL 4 MG/2ML IJ SOLN
4.0000 mg | Freq: Four times a day (QID) | INTRAMUSCULAR | Status: DC | PRN
Start: 2024-02-08 — End: 2024-02-18
  Administered 2024-02-14: 4 mg via INTRAVENOUS
  Filled 2024-02-08: qty 2

## 2024-02-08 MED ORDER — THIAMINE MONONITRATE 100 MG PO TABS
100.0000 mg | ORAL_TABLET | Freq: Every day | ORAL | Status: DC
  Administered 2024-02-09 – 2024-02-17 (×9): 100 mg via ORAL
  Filled 2024-02-08 (×9): qty 1

## 2024-02-08 MED ORDER — FUROSEMIDE 20 MG PO TABS
20.0000 mg | ORAL_TABLET | Freq: Every day | ORAL | Status: DC
  Administered 2024-02-09 – 2024-02-14 (×5): 20 mg via ORAL
  Filled 2024-02-08 (×6): qty 1

## 2024-02-08 MED ORDER — MAGNESIUM HYDROXIDE 400 MG/5ML PO SUSP: 30.0000 mL | Freq: Every day | ORAL | Status: DC | PRN

## 2024-02-08 MED ORDER — ALPRAZOLAM 0.25 MG PO TABS
0.2500 mg | ORAL_TABLET | Freq: Two times a day (BID) | ORAL | Status: DC | PRN
  Administered 2024-02-09 – 2024-02-12 (×4): 0.25 mg via ORAL
  Filled 2024-02-08 (×5): qty 1

## 2024-02-08 MED ORDER — ASPIRIN 81 MG PO TBEC
81.0000 mg | DELAYED_RELEASE_TABLET | Freq: Every day | ORAL | Status: DC
  Administered 2024-02-08 – 2024-02-17 (×10): 81 mg via ORAL
  Filled 2024-02-08 (×10): qty 1

## 2024-02-08 MED ORDER — ADULT MULTIVITAMIN W/MINERALS CH
1.0000 | ORAL_TABLET | Freq: Every day | ORAL | Status: DC
  Administered 2024-02-09 – 2024-02-17 (×9): 1 via ORAL
  Filled 2024-02-08 (×10): qty 1

## 2024-02-08 MED ORDER — ALUM & MAG HYDROXIDE-SIMETH 200-200-20 MG/5ML PO SUSP
30.0000 mL | Freq: Once | ORAL | Status: AC
  Administered 2024-02-08: 30 mL via ORAL
  Filled 2024-02-08: qty 30

## 2024-02-08 MED ORDER — MORPHINE SULFATE (PF) 2 MG/ML IV SOLN
2.0000 mg | INTRAVENOUS | Status: DC | PRN
  Administered 2024-02-08 – 2024-02-13 (×17): 2 mg via INTRAVENOUS
  Filled 2024-02-08 (×18): qty 1

## 2024-02-08 MED ORDER — ACETAMINOPHEN 325 MG PO TABS
650.0000 mg | ORAL_TABLET | ORAL | Status: DC | PRN
  Administered 2024-02-12 – 2024-02-16 (×2): 650 mg via ORAL
  Filled 2024-02-08 (×2): qty 2

## 2024-02-08 MED ORDER — METOPROLOL TARTRATE 25 MG PO TABS
12.5000 mg | ORAL_TABLET | Freq: Two times a day (BID) | ORAL | Status: DC
  Administered 2024-02-08 – 2024-02-11 (×4): 12.5 mg via ORAL
  Filled 2024-02-08 (×4): qty 1

## 2024-02-08 MED ORDER — QUETIAPINE FUMARATE 25 MG PO TABS
25.0000 mg | ORAL_TABLET | Freq: Every day | ORAL | Status: DC
  Administered 2024-02-08 – 2024-02-17 (×9): 25 mg via ORAL
  Filled 2024-02-08 (×9): qty 1

## 2024-02-08 MED ORDER — ATORVASTATIN CALCIUM 80 MG PO TABS
80.0000 mg | ORAL_TABLET | Freq: Every day | ORAL | Status: DC
  Administered 2024-02-08 – 2024-02-17 (×10): 80 mg via ORAL
  Filled 2024-02-08 (×2): qty 1
  Filled 2024-02-08: qty 4
  Filled 2024-02-08 (×6): qty 1
  Filled 2024-02-08: qty 4

## 2024-02-08 MED ORDER — HEPARIN (PORCINE) 25000 UT/250ML-% IV SOLN
1250.0000 [IU]/h | INTRAVENOUS | Status: DC
  Administered 2024-02-08: 950 [IU]/h via INTRAVENOUS
  Administered 2024-02-09: 1100 [IU]/h via INTRAVENOUS
  Filled 2024-02-08 (×2): qty 250

## 2024-02-08 MED ORDER — LORAZEPAM 2 MG/ML IJ SOLN
1.0000 mg | INTRAMUSCULAR | Status: AC | PRN
  Administered 2024-02-09 – 2024-02-10 (×2): 2 mg via INTRAVENOUS
  Filled 2024-02-08: qty 1

## 2024-02-08 MED ORDER — METOPROLOL TARTRATE 25 MG PO TABS: 25.0000 mg | ORAL_TABLET | Freq: Two times a day (BID) | ORAL | Status: DC

## 2024-02-08 MED ORDER — POTASSIUM CHLORIDE 20 MEQ PO PACK
40.0000 meq | PACK | Freq: Once | ORAL | Status: AC
  Administered 2024-02-08: 40 meq via ORAL
  Filled 2024-02-08: qty 2

## 2024-02-08 MED ORDER — METHOCARBAMOL 500 MG PO TABS
750.0000 mg | ORAL_TABLET | Freq: Four times a day (QID) | ORAL | Status: DC | PRN
Start: 2024-02-08 — End: 2024-02-18
  Administered 2024-02-09 – 2024-02-14 (×6): 750 mg via ORAL
  Filled 2024-02-08 (×6): qty 2

## 2024-02-08 MED ORDER — TRAZODONE HCL 50 MG PO TABS
25.0000 mg | ORAL_TABLET | Freq: Every evening | ORAL | Status: DC | PRN
  Administered 2024-02-09 – 2024-02-12 (×4): 25 mg via ORAL
  Filled 2024-02-08 (×4): qty 1

## 2024-02-08 MED ORDER — LORAZEPAM 1 MG PO TABS
1.0000 mg | ORAL_TABLET | ORAL | Status: AC | PRN
  Administered 2024-02-09 – 2024-02-10 (×2): 1 mg via ORAL
  Filled 2024-02-08 (×2): qty 1

## 2024-02-08 MED ORDER — FOLIC ACID 1 MG PO TABS
1.0000 mg | ORAL_TABLET | Freq: Every day | ORAL | Status: DC
  Administered 2024-02-09 – 2024-02-17 (×9): 1 mg via ORAL
  Filled 2024-02-08 (×9): qty 1

## 2024-02-08 MED ORDER — POTASSIUM CHLORIDE IN NACL 20-0.9 MEQ/L-% IV SOLN
INTRAVENOUS | Status: AC
  Filled 2024-02-08 (×3): qty 1000

## 2024-02-08 MED ORDER — PANTOPRAZOLE SODIUM 40 MG PO TBEC
40.0000 mg | DELAYED_RELEASE_TABLET | Freq: Every day | ORAL | Status: DC
  Administered 2024-02-09: 40 mg via ORAL
  Filled 2024-02-08: qty 1

## 2024-02-08 MED ORDER — ENOXAPARIN SODIUM 40 MG/0.4ML IJ SOSY: 40.0000 mg | PREFILLED_SYRINGE | INTRAMUSCULAR | Status: DC

## 2024-02-08 MED ORDER — THIAMINE HCL 100 MG/ML IJ SOLN: 100.0000 mg | Freq: Every day | INTRAMUSCULAR | Status: DC

## 2024-02-08 MED ORDER — ASPIRIN 81 MG PO TBEC: 81.0000 mg | DELAYED_RELEASE_TABLET | Freq: Every day | ORAL | Status: DC

## 2024-02-08 NOTE — ED Notes (Signed)
 Admitting MD at bedside.

## 2024-02-08 NOTE — ED Notes (Signed)
 ED Provider made aware of pt's second trop level of 90.

## 2024-02-08 NOTE — H&P (Signed)
 [place]   PATIENT NAME: Ethan Wells    MR#:  [id number]  DATE OF BIRTH:  20-Aug-1971  DATE OF ADMISSION:  02/08/2024  PRIMARY CARE PHYSICIAN: Pcp, No   Patient is coming from: Home  REQUESTING/REFERRING PHYSICIAN: Miki Kins, MD  CHIEF COMPLAINT:   Chief Complaint  Patient presents with   Chest Pain    HISTORY OF PRESENT ILLNESS:  Ethan Wells is a 53 y.o. male with medical history significant for coronary artery disease status post PCI and stents, CHF, alcohol abuse, paroxysmal atrial fibrillation, GERD, peripheral neuropathy, dyslipidemia, and and depression, presented to the emergency room with acute onset of substernal chest pain graded 10/10 in severity with associated nausea and vomiting without diaphoresis.  He admitted to mild dyspnea and palpitations.  He has also been having occasional cough and wheezing.  No fever or chills.  He noticed occasional bleeding from his hemorrhoids for which she had surgery twice last year.  No dysuria or hematuria or frequency or urgency or flank pain.  No other bleeding diathesis.  The patient has not been taking his medications for a while.  ED Course: When he came to the ER, heart rate was 103 with otherwise normal vital signs.  Labs revealed sensitive troponin I of 19,  then 90 and later 159.  Magnesium was 1.5.  INR was 1.5 without PT of 17.9.  Blood glucose B+ with negative antibody screen.  Alcohol level was 342 and urine drug screen came back negative.CBC showed hemoglobin 9.1 hematocrit 27.8 with platelets of 105.  CMP was remarkable for mild hypokalemia of 3.2 and a CO2 of 18 with blood glucose of 186, alk phos 254 albumin 2.9 and AST 81 and total bili 3.6. EKG as reviewed by me : EKG showed sinus tachycardia with rate 1082 with borderline right axis deviation and poor R wave progression and T wave inversion inferiorly and laterally. Imaging: Portable chest x-ray showed no acute cardiopulmonary disease.  The  patient was given 1 mg of IV Dilaudid.  He will be admitted to a cardiac telemetry observation bed for further evaluation and management. PAST MEDICAL HISTORY:   Past Medical History:  Diagnosis Date   Myocardial infarct (HCC)    patietn states    PAST SURGICAL HISTORY:   Hemorrhoidectomy twice PCI and 2 stents.  SOCIAL HISTORY:   Social History   Tobacco Use   Smoking status: Some Days    Types: Cigarettes   Smokeless tobacco: Current  Substance Use Topics   Alcohol use: Yes    Alcohol/week: 2.0 standard drinks of alcohol    Types: 2 Glasses of wine per week    FAMILY HISTORY:   Positive for MI, CVA, cancer, diabetes mellitus, hypertension and CHF.  DRUG ALLERGIES:   Allergies  Allergen Reactions   Nsaids Other (See Comments)    GI issues  Pt takes ASA 81 mg daily    Penicillins Hives    REVIEW OF SYSTEMS:   ROS As per history of present illness. All pertinent systems were reviewed above. Constitutional, HEENT, cardiovascular, respiratory, GI, GU, musculoskeletal, neuro, psychiatric, endocrine, integumentary and hematologic systems were reviewed and are otherwise negative/unremarkable except for positive findings mentioned above in the HPI.   MEDICATIONS AT HOME:   Prior to Admission medications   Not on File      VITAL SIGNS:  Blood pressure (!) 150/88, pulse 91, temperature 98.1 F (36.7 C), temperature source Oral, resp. rate 15, height 5\' 5"  (1.651  m), weight 87.5 kg, SpO2 100%.  PHYSICAL EXAMINATION:  Physical Exam  GENERAL:  53 y.o.-year-old male patient lying in the bed with no acute distress.  EYES: Pupils equal, round, reactive to light and accommodation. No scleral icterus. Extraocular muscles intact.  HEENT: Head atraumatic, normocephalic. Oropharynx and nasopharynx clear.  NECK:  Supple, no jugular venous distention. No thyroid enlargement, no tenderness.  LUNGS: Normal breath sounds bilaterally, no wheezing, rales,rhonchi or crepitation.  No use of accessory muscles of respiration.  CARDIOVASCULAR: Regular rate and rhythm, S1, S2 normal. No murmurs, rubs, or gallops.  ABDOMEN: Soft, nondistended, nontender. Bowel sounds present. No organomegaly or mass.  EXTREMITIES: No pedal edema, cyanosis, or clubbing.  NEUROLOGIC: Cranial nerves II through XII are intact. Muscle strength 5/5 in all extremities. Sensation intact. Gait not checked.  PSYCHIATRIC: The patient is alert and oriented x 3.  Normal affect and good eye contact. SKIN: No obvious rash, lesion, or ulcer.   LABORATORY PANEL:   CBC Recent Labs  Lab 02/08/24 1739  WBC 5.4  HGB 9.1*  HCT 27.8*  PLT 105*   ------------------------------------------------------------------------------------------------------------------  Chemistries  Recent Labs  Lab 02/08/24 1739 02/08/24 2215  NA 139  --   K 3.2*  --   CL 106  --   CO2 18*  --   GLUCOSE 186*  --   BUN 5*  --   CREATININE 0.73  --   CALCIUM 8.9  --   MG  --  1.5*  AST 81*  --   ALT 13  --   ALKPHOS 254*  --   BILITOT 3.6*  --    ------------------------------------------------------------------------------------------------------------------  Cardiac Enzymes No results for input(s): "TROPONINI" in the last 168 hours. ------------------------------------------------------------------------------------------------------------------  RADIOLOGY:  DG Chest Port 1 View Result Date: 02/08/2024 CLINICAL DATA:  Chest pain EXAM: PORTABLE CHEST 1 VIEW COMPARISON:  None Available. FINDINGS: Normal mediastinum and cardiac silhouette. Normal pulmonary vasculature. No evidence of effusion, infiltrate, or pneumothorax. No acute bony abnormality. IMPRESSION: No acute cardiopulmonary process. Electronically Signed   By: Genevive Bi M.D.   On: 02/08/2024 18:19      IMPRESSION AND PLAN:  Assessment and Plan: NSTEMI (non-ST elevated myocardial infarction) [facility]) - The patient will be admitted to an  observation cardiac telemetry bed. - Has associated coronary artery disease status post PCI and 2 stents. - Will follow serial troponins and EKGs. - The patient will be placed on aspirin as well as p.r.n. sublingual nitroglycerin and morphine sulfate for pain. - We will place him on IV heparin without bolus. - High-dose statin therapy will be given as well as beta-blocker therapy with Lopressor. - We will obtain a cardiology consult. - I notified [facility] group about the patient   Depression - We will continue Zoloft and Seroquel  Hypokalemia - We will replace potassium. - He has associated hypomagnesemia that will be replaced as well.  GERD without esophagitis - We will continue PPI therapy.  Dyslipidemia - We will continue statin therapy as well as Zetia    DVT prophylaxis: IV hep. Advanced Care Planning:  Code Status: full code. Family Communication:  The plan of care was discussed in details with the patient (and family). I answered all questions. The patient agreed to proceed with the above mentioned plan. Further management will depend upon hospital course. Disposition Plan: Back to previous home environment Consults called: Cardiology All the records are reviewed and case discussed with ED provider.  Status is: Observation  I certify that at  the time of admission, it is my clinical judgment that the patient will require  hospital care extending less than 2 midnights.                            Dispo: The patient is from: Home              Anticipated d/c is to: Home              Patient currently is not medically stable to d/c.              Difficult to place patient: No  Hannah Beat M.D on 02/09/2024 at 12:24 AM  [facility]   From 7 PM-7 AM, contact night-coverage [email or web address]  CC: Primary care physician; Pcp, No

## 2024-02-08 NOTE — Progress Notes (Addendum)
 PHARMACY - ANTICOAGULATION CONSULT NOTE  Pharmacy Consult for Heparin Infusion Indication: chest pain/ACS  Allergies  Allergen Reactions   Penicillins Hives    Patient Measurements: Height: 5\' 5"  (165.1 cm) Weight: 87.5 kg (193 lb) IBW/kg (Calculated) : 61.5 Heparin Dosing Weight: 80.1 kg  Vital Signs: Temp: 98.1 F (36.7 C) (02/23 1741) Temp Source: Oral (02/23 1741) BP: 123/74 (02/23 2130) Pulse Rate: 104 (02/23 2130)  Labs: Recent Labs    02/08/24 1739 02/08/24 1759 02/08/24 2025  HGB 9.1*  --   --   HCT 27.8*  --   --   PLT 105*  --   --   LABPROT  --  17.9*  --   INR  --  1.5*  --   CREATININE 0.73  --   --   TROPONINIHS 19*  --  90*    Estimated Creatinine Clearance: 111.1 mL/min (by C-G formula based on SCr of 0.73 mg/dL).   Medical History: Past Medical History:  Diagnosis Date   Myocardial infarct (HCC)    patietn states    Medications:  Patient not on anticoagulation at home per chart review  Assessment: Patient is a 53 year old male with alcohol abuse, alcoholic liver cirrhosis, portal hypertension status post TIPS, polysubstance abuse, orthostatic hypotension on midodrine, SMA stenosis, HTN, HLD, CAD, STEMI, chronic HFpEF, and GERD. He arrived to ED complain of chest pain. Aspirin 325 mg administered in route to hospital via EMS. Per EMS, patient has a history of GI issues and bleeding in stools. Troponin 19>90. Pharmacy was consulted to initiate this patient on a heparin infusion. Per conversation with MD, will not give heparin boluses given history of GIB.   Baseline INR and aPTT ordered.  No signs/symptoms of bleeding noted in chart. Hgb 9.1. PLT 105.  Goal of Therapy:  Heparin level 0.3-0.7 units/ml Monitor platelets by anticoagulation protocol: Yes   Plan:  Per conversation with MD, will not give heparin boluses given history of GIB.  Start heparin infusion at a rate of 950 units/hr Check heparin level in 6 hours Monitor CBC daily  while on heparin   Merryl Hacker, PharmD Clinical Pharmacist  02/08/2024,9:55 PM

## 2024-02-08 NOTE — ED Notes (Addendum)
 Provided Urinal at bedside for UA sample. Messaged MD about providing patient with pain management medication.

## 2024-02-08 NOTE — ED Provider Notes (Signed)
 [facility] Provider Note    Event Date/Time   First MD Initiated Contact with Patient 02/08/24 1741     (approximate)   History   Chest Pain   HPI  Ethan Wells is a 53 y.o. male with a history of alcohol abuse, alcoholic liver cirrhosis, portal hypertension status post TIPS, polysubstance abuse, orthostatic hypotension on midodrine, SMA stenosis, HTN, HLD, CAD, STEMI, chronic HFpEF, and GERD who presents with chest pain, acute onset this afternoon while he was lying down watching TV, described as pressure, central in his chest, nonradiating.  He reports associated nausea and vomiting.  He also feels short of breath but denies feeling dizzy or lightheaded.  He states he has had similar pain before.  He also reports chronic GI bleeding and states that recently he has started to have some red blood in his stools.  He denies any abdominal pain.  I reviewed the past medical records; the patient's records appear under a different chart, [facility].  He was most recently admitted in late January with chest pain and elevated troponin in the context of cocaine and alcohol use.  He was also positive for influenza at that time.   Physical Exam   Triage Vital Signs: ED Triage Vitals  Encounter Vitals Group     BP 02/08/24 1748 123/71     Systolic BP Percentile --      Diastolic BP Percentile --      Pulse Rate 02/08/24 1741 (!) 103     Resp 02/08/24 1741 18     Temp 02/08/24 1741 98.1 F (36.7 C)     Temp Source 02/08/24 1741 Oral     SpO2 02/08/24 1741 100 %     Weight 02/08/24 1743 193 lb (87.5 kg)     Height 02/08/24 1743 5\' 5"  (1.651 m)     Head Circumference --      Peak Flow --      Pain Score 02/08/24 1743 10     Pain Loc --      Pain Education --      Exclude from Growth Chart --     Most recent vital signs: Vitals:   02/08/24 1900 02/08/24 2130  BP: 117/68 123/74  Pulse: 100 (!) 104  Resp: 14 15  Temp:    SpO2: 98% 100%     General: Alert and oriented, no distress.  CV:  Good peripheral perfusion.  Resp:  Normal effort.  Lungs CTAB. Abd:  No distention.  Other:  No peripheral edema.  Motor intact in all extremities.   ED Results / Procedures / Treatments   Labs (all labs ordered are listed, but only abnormal results are displayed) Labs Reviewed  COMPREHENSIVE METABOLIC PANEL - Abnormal; Notable for the following components:      Result Value   Potassium 3.2 (*)    CO2 18 (*)    Glucose, Bld 186 (*)    BUN 5 (*)    Albumin 2.9 (*)    AST 81 (*)    Alkaline Phosphatase 254 (*)    Total Bilirubin 3.6 (*)    All other components within normal limits  CBC WITH DIFFERENTIAL/PLATELET - Abnormal; Notable for the following components:   RBC 3.15 (*)    Hemoglobin 9.1 (*)    HCT 27.8 (*)    RDW 20.0 (*)    Platelets 105 (*)    All other components within normal limits  PROTIME-INR - Abnormal; Notable for  the following components:   Prothrombin Time 17.9 (*)    INR 1.5 (*)    All other components within normal limits  ETHANOL - Abnormal; Notable for the following components:   Alcohol, Ethyl (B) 342 (*)    All other components within normal limits  TROPONIN I (HIGH SENSITIVITY) - Abnormal; Notable for the following components:   Troponin I (High Sensitivity) 19 (*)    All other components within normal limits  TROPONIN I (HIGH SENSITIVITY) - Abnormal; Notable for the following components:   Troponin I (High Sensitivity) 90 (*)    All other components within normal limits  URINE DRUG SCREEN, QUALITATIVE ([facility] ONLY)  HIV ANTIBODY (ROUTINE TESTING W REFLEX)  MAGNESIUM  TYPE AND SCREEN     EKG  ED ECG REPORT I, Dionne Bucy, the attending physician, personally viewed and interpreted this ECG.  Date: 02/08/2024 EKG Time: 1750 Rate: 101 Rhythm: normal sinus rhythm QRS Axis: Borderline right axis Intervals: normal ST/T Wave abnormalities: Nonspecific repolarization  abnormality Narrative Interpretation: no evidence of acute ischemia; no significant change when compared to EKG of 01/18/2024    RADIOLOGY  Chest x-ray: I independently viewed and interpreted the images; there is no focal consolidation or edema   PROCEDURES:  Critical Care performed: No  Procedures   MEDICATIONS ORDERED IN ED: Medications  acetaminophen (TYLENOL) tablet 650 mg (has no administration in time range)  ondansetron (ZOFRAN) injection 4 mg (has no administration in time range)  alum & mag hydroxide-simeth (MAALOX/MYLANTA) 200-200-20 MG/5ML suspension 30 mL (has no administration in time range)  aspirin EC tablet 81 mg (has no administration in time range)  atorvastatin (LIPITOR) tablet 80 mg (has no administration in time range)  ALPRAZolam (XANAX) tablet 0.25 mg (has no administration in time range)  traZODone (DESYREL) tablet 25 mg (has no administration in time range)  magnesium hydroxide (MILK OF MAGNESIA) suspension 30 mL (has no administration in time range)  morphine (PF) 2 MG/ML injection 2 mg (has no administration in time range)  metoprolol tartrate (LOPRESSOR) tablet 12.5 mg (has no administration in time range)  0.9 % NaCl with KCl 20 mEq/ L  infusion (has no administration in time range)  potassium chloride (KLOR-CON) packet 40 mEq (has no administration in time range)  HYDROmorphone (DILAUDID) injection 1 mg (1 mg Intravenous Given 02/08/24 2007)     IMPRESSION / MDM / ASSESSMENT AND PLAN / ED COURSE  I reviewed the triage vital signs and the nursing notes.  53 year old male with PMH as noted above presents with chest pain since this afternoon.  He was given aspirin by EMS.  On exam his vital signs are normal.  Physical exam is otherwise unremarkable for acute findings.  Differential diagnosis includes, but is not limited to, ACS, musculoskeletal chest wall pain, GERD.  I have a low suspicion for PE given lack of tachycardia or hypoxia.  There is also no  clinical evidence for aortic dissection or other vascular etiology.  EKG is nonischemic.  The patient has had chest pain previously in the context of intoxication and substance abuse.  We will obtain basic labs, cardiac enzymes, ethanol and UDS, chest x-ray, and reassess.  Patient's presentation is most consistent with acute presentation with potential threat to life or bodily function.  The patient is on the cardiac monitor to evaluate for evidence of arrhythmia and/or significant heart rate changes.  ----------------------------------------- 9:23 PM on 02/08/2024 -----------------------------------------  Ethanol level is significant elevated consistent with acute intoxication.  UDS is  negative.  Initial troponin was only minimally elevated, however the repeat has significantly jumped to 90.  The patient still has chest pain.  Given his elevated risk and active pain he will need admission for further monitoring.  At this time there is no indication for heparin infusion.  The CBC shows a hemoglobin 9.1 which is stable.  I counseled the patient on the results of the workup.  He is in agreement with admission.  I consulted Dr. Arville Care from the hospitalist service; based on our discussion he agrees to evaluate the patient for admission.   FINAL CLINICAL IMPRESSION(S) / ED DIAGNOSES   Final diagnoses:  Chest pain, unspecified type  Alcoholic intoxication without complication (HCC)     Rx / DC Orders   ED Discharge Orders     None        Note:  This document was prepared using Dragon voice recognition software and may include unintentional dictation errors.    Dionne Bucy, MD 02/08/24 2202

## 2024-02-08 NOTE — ED Triage Notes (Addendum)
 Coming from home. Chest pain started about 45 minutes from 1737. Hx MI and stents placed. HR ST with fire dept and EMS. Hx of anxiety. 325mg  of Asprin administered to patient pre-hospital by EMS. Patient was eating [employer] and drinking wine prior to ER admission today. Patient has Hx of GI issues and bleeding in stools reported by EMS. Patient rating chest pain 10/10. Patient also complaining of pain in the epigastric region. EKG printed and handed to MD. Patient AOX4

## 2024-02-09 ENCOUNTER — Observation Stay
Admit: 2024-02-09 | Discharge: 2024-02-09 | Disposition: A | Discharge status: Home / Self Care | Payer: Medicare HMO | Attending: Family Medicine | Admitting: Family Medicine

## 2024-02-09 ENCOUNTER — Observation Stay: Payer: Medicare HMO

## 2024-02-09 DIAGNOSIS — E876 Hypokalemia: Secondary | ICD-10-CM | POA: Insufficient documentation

## 2024-02-09 DIAGNOSIS — K7031 Alcoholic cirrhosis of liver with ascites: Secondary | ICD-10-CM | POA: Diagnosis not present

## 2024-02-09 DIAGNOSIS — D61818 Other pancytopenia: Secondary | ICD-10-CM | POA: Diagnosis not present

## 2024-02-09 DIAGNOSIS — E66811 Obesity, class 1: Secondary | ICD-10-CM | POA: Insufficient documentation

## 2024-02-09 DIAGNOSIS — I2510 Atherosclerotic heart disease of native coronary artery without angina pectoris: Secondary | ICD-10-CM | POA: Diagnosis not present

## 2024-02-09 DIAGNOSIS — F32A Depression, unspecified: Secondary | ICD-10-CM | POA: Insufficient documentation

## 2024-02-09 DIAGNOSIS — E785 Hyperlipidemia, unspecified: Secondary | ICD-10-CM | POA: Insufficient documentation

## 2024-02-09 DIAGNOSIS — I700 Atherosclerosis of aorta: Secondary | ICD-10-CM | POA: Diagnosis not present

## 2024-02-09 DIAGNOSIS — R072 Precordial pain: Secondary | ICD-10-CM | POA: Diagnosis not present

## 2024-02-09 DIAGNOSIS — I214 Non-ST elevation (NSTEMI) myocardial infarction: Secondary | ICD-10-CM | POA: Diagnosis not present

## 2024-02-09 DIAGNOSIS — I480 Paroxysmal atrial fibrillation: Secondary | ICD-10-CM | POA: Insufficient documentation

## 2024-02-09 DIAGNOSIS — K219 Gastro-esophageal reflux disease without esophagitis: Secondary | ICD-10-CM | POA: Insufficient documentation

## 2024-02-09 LAB — BASIC METABOLIC PANEL
Anion gap: 9 (ref 5–15)
BUN: <5 mg/dL — ABNORMAL LOW (ref 6–20)
CO2: 22 mmol/L (ref 22–32)
Calcium: 8.5 mg/dL — ABNORMAL LOW (ref 8.9–10.3)
Chloride: 108 mmol/L (ref 98–111)
Creatinine, Ser: 0.56 mg/dL — ABNORMAL LOW (ref 0.61–1.24)
GFR, Estimated: >60 mL/min (ref >60)
Glucose, Bld: 98 mg/dL (ref 70–99)
Potassium: 3.4 mmol/L — ABNORMAL LOW (ref 3.5–5.1)
Sodium: 139 mmol/L (ref 135–145)

## 2024-02-09 LAB — ECHOCARDIOGRAM COMPLETE
AR max vel: 2.43 cm2
AV Area VTI: 2.57 cm2
AV Area mean vel: 2.27 cm2
AV Mean grad: 4 mmHg
AV Peak grad: 8.1 mmHg
Ao pk vel: 1.42 m/s
Area-P 1/2: 3.51 cm2
Calc EF: 56.8 %
Height: 65 in
MV VTI: 3.01 cm2
S' Lateral: 3.8 cm
Single Plane A2C EF: 50.7 %
Single Plane A4C EF: 61.9 %
Weight: 3088 [oz_av]

## 2024-02-09 LAB — CBC
HCT: 25.3 % — ABNORMAL LOW (ref 39.0–52.0)
HCT: 25.7 % — ABNORMAL LOW (ref 39.0–52.0)
Hemoglobin: 8 g/dL — ABNORMAL LOW (ref 13.0–17.0)
Hemoglobin: 8.2 g/dL — ABNORMAL LOW (ref 13.0–17.0)
MCH: 28.5 pg (ref 26.0–34.0)
MCH: 28.6 pg (ref 26.0–34.0)
MCHC: 31.6 g/dL (ref 30.0–36.0)
MCHC: 31.9 g/dL (ref 30.0–36.0)
MCV: 90 fL (ref 80.0–100.0)
MCV: 89.5 fL (ref 80.0–100.0)
Platelets: 91 10*3/uL — ABNORMAL LOW (ref 150–400)
Platelets: 85 10*3/uL — ABNORMAL LOW (ref 150–400)
RBC: 2.81 MIL/uL — ABNORMAL LOW (ref 4.22–5.81)
RBC: 2.87 MIL/uL — ABNORMAL LOW (ref 4.22–5.81)
RDW: 20.1 % — ABNORMAL HIGH (ref 11.5–15.5)
RDW: 20.5 % — ABNORMAL HIGH (ref 11.5–15.5)
WBC: 5.5 10*3/uL (ref 4.0–10.5)
WBC: 5.2 10*3/uL (ref 4.0–10.5)
nRBC: 0 % (ref 0.0–0.2)
nRBC: 0 % (ref 0.0–0.2)

## 2024-02-09 LAB — FERRITIN: Ferritin: 27 ng/mL (ref 24–336)

## 2024-02-09 LAB — TSH: TSH: 0.567 u[IU]/mL (ref 0.350–4.500)

## 2024-02-09 LAB — HIV ANTIBODY (ROUTINE TESTING W REFLEX): HIV Screen 4th Generation wRfx: NONREACTIVE

## 2024-02-09 LAB — HEPARIN LEVEL (UNFRACTIONATED)
Heparin Unfractionated: 0.2 [IU]/mL — ABNORMAL LOW (ref 0.30–0.70)
Heparin Unfractionated: 0.3 [IU]/mL (ref 0.30–0.70)

## 2024-02-09 LAB — TROPONIN I (HIGH SENSITIVITY): Troponin I (High Sensitivity): 154 ng/L (ref <18)

## 2024-02-09 LAB — MAGNESIUM: Magnesium: 2.4 mg/dL (ref 1.7–2.4)

## 2024-02-09 LAB — IRON AND TIBC
Iron: 101 ug/dL (ref 45–182)
Saturation Ratios: 28 % (ref 17.9–39.5)
TIBC: 361 ug/dL (ref 250–450)
UIBC: 260 ug/dL

## 2024-02-09 LAB — PHOSPHORUS: Phosphorus: 3.5 mg/dL (ref 2.5–4.6)

## 2024-02-09 LAB — VITAMIN B12: Vitamin B-12: 1302 pg/mL — ABNORMAL HIGH (ref 180–914)

## 2024-02-09 MED ORDER — IOHEXOL 350 MG/ML SOLN
75.0000 mL | Freq: Once | INTRAVENOUS | Status: AC | PRN
Start: 2024-02-09 — End: 2024-02-09
  Administered 2024-02-09: 75 mL via INTRAVENOUS

## 2024-02-09 MED ORDER — SODIUM CHLORIDE 0.9 % IV BOLUS
500.0000 mL | Freq: Once | INTRAVENOUS | Status: AC
  Administered 2024-02-09: 500 mL via INTRAVENOUS

## 2024-02-09 MED ORDER — MIDODRINE HCL 5 MG PO TABS
5.0000 mg | ORAL_TABLET | Freq: Once | ORAL | Status: AC
  Administered 2024-02-10: 5 mg via ORAL
  Filled 2024-02-09: qty 1

## 2024-02-09 MED ORDER — MAGNESIUM SULFATE 50 % IJ SOLN: 3.0000 g | Freq: Once | INTRAVENOUS | Status: DC

## 2024-02-09 MED ORDER — GABAPENTIN 100 MG PO CAPS
100.0000 mg | ORAL_CAPSULE | Freq: Once | ORAL | Status: AC
  Administered 2024-02-09: 100 mg via ORAL
  Filled 2024-02-09: qty 1

## 2024-02-09 MED ORDER — MAGNESIUM SULFATE 2 GM/50ML IV SOLN
2.0000 g | Freq: Once | INTRAVENOUS | Status: AC
  Administered 2024-02-09: 2 g via INTRAVENOUS
  Filled 2024-02-09: qty 50

## 2024-02-09 MED ORDER — POTASSIUM CHLORIDE CRYS ER 20 MEQ PO TBCR
40.0000 meq | EXTENDED_RELEASE_TABLET | Freq: Once | ORAL | Status: AC
  Administered 2024-02-09: 40 meq via ORAL
  Filled 2024-02-09: qty 2

## 2024-02-09 MED ORDER — POTASSIUM CHLORIDE IN NACL 20-0.9 MEQ/L-% IV SOLN
INTRAVENOUS | Status: AC
  Filled 2024-02-09 (×3): qty 1000

## 2024-02-09 MED ORDER — MAGNESIUM SULFATE IN D5W 1-5 GM/100ML-% IV SOLN
1.0000 g | Freq: Once | INTRAVENOUS | Status: AC
  Administered 2024-02-09: 1 g via INTRAVENOUS
  Filled 2024-02-09: qty 100

## 2024-02-09 NOTE — Assessment & Plan Note (Signed)
-   We will replace potassium. - He has associated hypomagnesemia that will be replaced as well.

## 2024-02-09 NOTE — Assessment & Plan Note (Signed)
 -  We will continue statin therapy as well as Zetia.

## 2024-02-09 NOTE — Consult Note (Signed)
 Cardiology Consultation   Patient ID: Ethan Wells MRN: [id number]; DOB: 1971/06/20  Admit date: 02/08/2024 Date of Consult: 02/09/2024  PCP:  Aviva Kluver   [facility] Providers Cardiologist:  New  [name] is a 53 y.o. male with a hx of CAD with prior STEMI and VF arrest in 01/2011 with LAD stenting, NSTEMI in 10/2022, ischemic CM, Cirrhosis s/p TIPS, polysubstance abuse (alcohol, tobacco, cocaine use), recurrent GIB,mesenteric disease s/p SMA stenting, paroxysmal Afib, GERD, peripheral neuropathy, dyslipidemia, and depression who is being seen 02/09/2024 for the evaluation of chest pain at the request of Dr. Chipper Herb.  History of Present Illness:   Mr. Ethan Wells suffered an anterior STEMI in Vfib arrest in 2012 and underwent LAD PCI. He was admitted 10/2022 with NSTEMI And rectal bleeding/anemia. Cardiac cath showed patent LAD stent with chronically occluded left circumflex and RCA with L>L and L>R collaterals. He was medically managed and required ligation of his hemorrhoids x2 during admission. He was diuresed and discharged at 199 lbs. He was readmitted to [facility] 11/19/22 due to recurrent rectal bleeding and chest/back pain. HS troponin were normal but UDS was positive for cocaine. He was conservatively managed from a cardiac standpoint and Imdur was added to his medication regimen. H&H remained stable during his admission. HE presented to [facility] 01/16/23 and was hypotensive and anemia with Hgb 6.3. K was 5. Lipase 78, AST 99, ALT24, alk phose 271. EKG non-acute.CTA GIB study showed findings suspicious for small active rectal bleed, severe stenosis in the celiac, colitis vs congestive cold pathway, duodenitis, and cirrhosis with borderline splenomegaly with portal HTN. He was placed on norepi and admitted to the ICU. BB was held and ASA was held. Patient was deemed not a good cath candidate due to ongoing bleeding. He presented back to [facility] 03/01/23 for EGD  procedure. HE was found to have Barrett's esophagus, esophageal varices. Post-op he complained of chest pain similar to prior heart attack. Cardiology was asked to see. EKG showed no changes and he was given pain meds with improvement of chest pain. He was again admitted to [facility] 11/23/23 for NSTEMI in the setting of cocaine use. HS trop peak 48. Alcohol level was 162. UDS showed cocaine.no further wotk-up was pursued.   He presented to [facility] 11/30/13 with leg weakness. He was discharged home and came back 12 hours later reporting chest pain, but left AMA. He returned to the ER 12/12/23 with chest pain. EMS gave him ASA and 1 inch of nitroglycerin paste. HS troponin peak 313, suspected demand ischemia in the setting of vasospasm from cocaine and alcohol use. Echo 12/28 showed LVEF 50-55%, no WMA. EKG non-ischemic. IV heparin stopped due to drop in Hgb. NO ASA given GIB. Deemed no a good cardiac cath candidate.   The patient presented to the ER 02/08/24 with chest pain. Is started yesterday while in the bed. It woke him up. He describes it as a pressure and burning sensation. Pain was 10/10. He also had SOB, nasuea and vomiting x 2. Pain was not worse with exertion. Pain is different than before. SL nTG did not help the pain. He denies recent cocaine use. He drank alcohol yesterday, a bottle of wine. Patient smokes 3 cigarettes a day. He reports BRBPR and dark stools. Reports feeling hot and having chills. He reports occasional lower leg edema.   In the ER BP 123/71, pulse 103bpm, RR 18, afebrile, 100%O2. Labs showed K3.2, CO2 18, BG 186, albumin 2.9, AST  81, Alk phos 254, Hgb 9.1, INR 1.5, alcohol 342. HS trop 19>90>159>154. EKG showed ST 108bpm with no significant changes. CXR non-acute.  The patient was started on I V heparin and admitted.   He reports pain 8/10 and improved by pain medicine.    Past Medical History:  Diagnosis Date   Myocardial infarct [facility])    patietn states    History reviewed. No  pertinent surgical history.   Home Medications:  Prior to Admission medications   Medication Sig Start Date End Date Taking? Authorizing Provider  albuterol (VENTOLIN HFA) 108 (90 Base) MCG/ACT inhaler Inhale 2 puffs into the lungs every 6 (six) hours as needed. 01/23/24  Yes [provider]  ASPIRIN LOW DOSE 81 MG tablet Take 81 mg by mouth daily. 01/14/24  Yes [provider]  furosemide (LASIX) 40 MG tablet Take 40 mg by mouth daily. 10/18/23  Yes [provider]  gabapentin (NEURONTIN) 100 MG capsule Take 100 mg by mouth 2 (two) times daily. 01/23/24  Yes [provider]  hydrocortisone (CORTEF) 20 MG tablet Take by mouth as directed. 01/26/24  Yes [provider]  ipratropium-albuterol (DUONEB) 0.5-2.5 (3) MG/3ML SOLN Take 3 mLs by nebulization every 2 (two) hours as needed (WHEEZE, SOB). 10/18/23  Yes [provider]  methocarbamol (ROBAXIN) 750 MG tablet Take 750 mg by mouth 2 (two) times daily as needed. 10/18/23  Yes [provider]  midodrine (PROAMATINE) 5 MG tablet Take 5 mg by mouth 2 (two) times daily with a meal. 10/18/23  Yes [provider]  ondansetron (ZOFRAN-ODT) 4 MG disintegrating tablet Take 4 mg by mouth every 8 (eight) hours as needed. 01/02/24  Yes [provider]  pantoprazole (PROTONIX) 40 MG tablet Take 40 mg by mouth 2 (two) times daily. 10/04/23  Yes [provider]  potassium chloride SA (KLOR-CON M) 20 MEQ tablet Take 20 mEq by mouth daily. 01/23/24  Yes [provider]  QUEtiapine (SEROQUEL) 25 MG tablet Take 25 mg by mouth at bedtime. 01/23/24  Yes [provider]  rosuvastatin (CRESTOR) 40 MG tablet Take 40 mg by mouth daily. 01/23/24  Yes [provider]  sertraline (ZOLOFT) 50 MG tablet Take 50 mg by mouth daily. 01/23/24  Yes [provider]  thiamine (VITAMIN B1) 100 MG tablet Take 100 mg by mouth daily. 01/23/24  Yes [provider]     Inpatient Medications: Scheduled Meds:  aspirin EC  81 mg Oral Daily   atorvastatin  80 mg Oral Daily   folic acid  1 mg Oral Daily   furosemide  20 mg Oral Daily   LORazepam  0-4 mg Intravenous Q6H   Followed by   Melene Muller ON 02/10/2024] LORazepam  0-4 mg Intravenous Q12H   metoprolol tartrate  12.5 mg Oral BID   multivitamin with minerals  1 tablet Oral Daily   pantoprazole  40 mg Oral Daily   QUEtiapine  25 mg Oral QHS   thiamine  100 mg Oral Daily   Or   thiamine  100 mg Intravenous Daily   Continuous Infusions:  0.9 % NaCl with KCl 20 mEq / L 100 mL/hr at 02/08/24 2233   heparin 950 Units/hr (02/08/24 2319)   PRN Meds: acetaminophen, ALPRAZolam, LORazepam **OR** LORazepam, magnesium hydroxide, methocarbamol, morphine injection, ondansetron (ZOFRAN) IV, traZODone  Allergies:    Allergies  Allergen Reactions   Nsaids Other (See Comments)    GI issues  Pt takes ASA 81 mg daily  Penicillins Hives    Social History:   Social History   Socioeconomic History   Marital status: Single    Spouse name: Not on file   Number of children: Not on file   Years of education: Not on file   Highest education level: Not on file  Occupational History   Not on file  Tobacco Use   Smoking status: Some Days    Types: Cigarettes   Smokeless tobacco: Current  Substance and Sexual Activity   Alcohol use: Yes    Alcohol/week: 2.0 standard drinks of alcohol    Types: 2 Glasses of wine per week   Drug use: Not Currently   Sexual activity: Not Currently  Other Topics Concern   Not on file  Social History Narrative   Not on file   Social Drivers of Health   Financial Resource Strain: Not on file  Food Insecurity: Not on file  Transportation Needs: Not on file  Physical Activity: Not on file  Stress: Not on file  Social Connections: Not on file  Intimate Partner Violence: Not on file    Family History:   History reviewed. No pertinent family history.   ROS:  Please  see the history of present illness.   All other ROS reviewed and negative.     Physical Exam/Data:   Vitals:   02/09/24 0630 02/09/24 0700 02/09/24 0702 02/09/24 0720  BP: (!) 96/54 (!) 89/48    Pulse: 89 82  77  Resp: 18 13  11   Temp:   97.9 F (36.6 C)   TempSrc:   Oral   SpO2: 95% 99%  98%  Weight:      Height:        Intake/Output Summary (Last 24 hours) at 02/09/2024 0736 Last data filed at 02/08/2024 1830 Gross per 24 hour  Intake --  Output 300 ml  Net -300 ml      02/08/2024    5:43 PM  Last 3 Weights  Weight (lbs) 193 lb  Weight (kg) 87.544 kg     Body mass index is 32.12 kg/m.  General:  Well nourished, well developed, in no acute distress HEENT: normal Neck: no JVD Vascular: No carotid bruits; Distal pulses 2+ bilaterally Cardiac:  normal S1, S2; RRR; no murmur  Lungs:  clear to auscultation bilaterally, no wheezing, rhonchi or rales  Abd: soft, nontender, no hepatomegaly  Ext: no edema Musculoskeletal:  No deformities, BUE and BLE strength normal and equal Skin: warm and dry  Neuro:  CNs 2-12 intact, no focal abnormalities noted Psych:  Normal affect   EKG:  The EKG was personally reviewed and demonstrates:  ST, 108bpm, nonspecific T wave changes, no changes Telemetry:  Telemetry was personally reviewed and demonstrates:  NSR PVCs, HR 80s  Relevant CV Studies:  12/13/2023 TTE 1. Left ventricular ejection fraction, by estimation, is 50 to 55%. Left  ventricular ejection fraction by 3D volume is 54 %. The left ventricle has  low normal function. The left ventricle has no regional wall motion  abnormalities. Left ventricular  diastolic parameters are consistent with Grade I diastolic dysfunction  (impaired relaxation).   2. Right ventricular systolic function is normal. The right ventricular  size is normal. There is mildly elevated pulmonary artery systolic  pressure.   3. The mitral valve is normal in structure. No evidence of mitral valve   regurgitation. No evidence of mitral stenosis.   4. The aortic valve is tricuspid. Aortic valve regurgitation is not  visualized. No aortic stenosis is present.   5. The inferior vena cava is normal in size with greater than 50%  respiratory variability, suggesting right atrial pressure of 3 mmHg.   LHC 2023    Mid Cx lesion is 100% stenosed.   Ramus lesion is 85% stenosed.   Prox LAD to Mid LAD lesion is 5% stenosed.   Prox RCA to Mid RCA lesion is 100% stenosed.   Prox RCA lesion is 80% stenosed.   There is mild left ventricular systolic dysfunction.   LV end diastolic pressure is moderately elevated.   The left ventricular ejection fraction is 45-50% by visual estimate.   1.  Severe two-vessel coronary artery disease with chronically occluded mid left circumflex and mid right coronary artery with collaterals.  These vessels were patent with mild disease on previous cardiac catheterization in 2012.  LAD stent is patent with minimal restenosis.  In addition, there is progression of proximal to mid ramus stenosis. 2.  Mildly reduced LV systolic function with an EF of 45%. 3.  Moderately elevated left ventricular end-diastolic pressure at 26 mmHg.   Recommendations: The patient has 2 chronic occlusions with collaterals.  Recommend medical therapy for now.  I added small dose Imdur.  Given ongoing rectal bleeding, risks of PCI outweigh the benefit at this time.  In the near future, once his bleeding issues are controlled and if he continues to have anginal symptoms, PCI of the ramus and possibly the right coronary artery can be considered.  The RCA has a microchannel of patent flow. Continue IV furosemide today given elevated LVEDP and switch to oral likely tomorrow.    Laboratory Data:  High Sensitivity Troponin:   Recent Labs  Lab 02/08/24 1739 02/08/24 2025 02/08/24 2315 02/09/24 0208  TROPONINIHS 19* 90* 159* 154*     Chemistry Recent Labs  Lab 02/08/24 1739 02/08/24 2215   NA 139  --   K 3.2*  --   CL 106  --   CO2 18*  --   GLUCOSE 186*  --   BUN 5*  --   CREATININE 0.73  --   CALCIUM 8.9  --   MG  --  1.5*  GFRNONAA >60  --   ANIONGAP 15  --     Recent Labs  Lab 02/08/24 1739  PROT 7.7  ALBUMIN 2.9*  AST 81*  ALT 13  ALKPHOS 254*  BILITOT 3.6*   Lipids No results for input(s): "CHOL", "TRIG", "HDL", "LABVLDL", "LDLCALC", "CHOLHDL" in the last 168 hours.  Hematology Recent Labs  Lab 02/08/24 1739 02/09/24 0500  WBC 5.4 5.5  RBC 3.15* 2.81*  HGB 9.1* 8.0*  HCT 27.8* 25.3*  MCV 88.3 90.0  MCH 28.9 28.5  MCHC 32.7 31.6  RDW 20.0* 20.1*  PLT 105* 91*   Thyroid No results for input(s): "TSH", "FREET4" in the last 168 hours.  BNPNo results for input(s): "BNP", "PROBNP" in the last 168 hours.  DDimer No results for input(s): "DDIMER" in the last 168 hours.   Radiology/Studies:  DG Chest Port 1 View Result Date: 02/08/2024 CLINICAL DATA:  Chest pain EXAM: PORTABLE CHEST 1 VIEW COMPARISON:  None Available. FINDINGS: Normal mediastinum and cardiac silhouette. Normal pulmonary vasculature. No evidence of effusion, infiltrate, or pneumothorax. No acute bony abnormality. IMPRESSION: No acute cardiopulmonary process. Electronically Signed   By: Genevive Bi M.D.   On: 02/08/2024 18:19     Assessment and Plan:   Chest pain/elevated troponin CAD with prior stenting -  presented with chest pain that occurred while sleeping described as a burning sensation, not worse with exertion. Patient drank an entire bottle of wine yesterday. Alcohol level 342. Pain improved with pain medication. - HS troponin 19>90>159>154 - prior heart cath 10/2022 showed severe 2V CAD with CTO to the mid left Cx and CTO of the mid RCA, LAD stent placement, collaterals were present, no intervention recommended - Echo 12/28 showed LVEF 50-55%, no WMA - EKG with no significant changes - PTA ASA  81mg  daily. It has been held in the past due to GIB - IV heparin x 48  hours. Monitor Hgb - did not tolerate Imdur in the past due to hypotension. He is on Lopressor 12.5mg BID - continue Crestor - repeat echo ordered - he is not a good cath candidate. Suspect demand ischemia in the setting of alcohol use and anemia with underlying CAD  Polysubstance Abuse - denies recent cocaine use - UDS negative - he reports he drank a bottle of wine yesterday  Chronic hypotension - has required midodrine in the past - last admission he had adrenal crisis with hypotension treated with IV steroids - PTA midodrine 5mg  BID and hydrocortisone - this admission, BP was normal - now on lopressor 12.5mg  BID and lasix 20mg  daily and BP trending soft  Chronic diastolic heart failure - appears euvolemic - continue lasix 20mg  daily  Hypokalemia - 3.2>3.4  Anemia - Hgb 9.1>8.0 - he reports BRBPR and dark stools - on I V heparin>may need to stop. Repeat CBC ordered  GERD - continue PPI  For questions or updates, please contact [facility] Please consult [email or web address] for contact info under    Signed, Bonna Steury David Stall, PA-C  02/09/2024 7:36 AM

## 2024-02-09 NOTE — Discharge Instructions (Signed)
Intensive Outpatient Programs   [facility] [address] [place],  [place], [place] [phone]  [facility] (Inpatient and [phone] (Suboxone and Methadone) [address] [phone]  ADS: Alcohol & Drug [facility] Programs - Intensive Outpatient [address] [place], [place] [address], [place]  [phone]  [facility] (Outpatient, Inpatient, [facility] (Groups and Residental) (insurance only) [phone] [place], [place] [phone]   [facility] (for caregivers and family) [address] Laurell Josephs [address] [place], [place] [phone]  Residential Treatment Programs  [facility](2 years) Residential: 28 days)[facility] ([facility] [address] [place], [place], [place] [phone] or [phone]  [facility] Treatment [facility] [address] [place], [place], [place] [phone]  [facility] Residential Treatment FacilityResidential Treatment Services (RTS) [address] [place], [place], [place] [phone] Admissions: 8am-3pm M-F  [facility] Program: Residential Program [phone] Days)             [facility]: [facility]  [place], [place], [place]  [phone] or [phone] in Hours over the weekend or by referral)  [facility] [address] [place], [place] [address] [phone] (Do virtual or phone assessment, offer transportation within 25 miles, have in patient and Outpatient options)   [facility] Crisis: [facility]:[phone] (for crisis  response 24 hours a day)      Addiction and Co-Occurring Disorders   [facility], Intensive Outpatient Program is held three afternoons from 1 to 4pm; Monday, Wednesday and Friday allowing patients the opportunity to participate in a treatment program in the day while attending A meetings in the evening. W e provide educational lectures and films, group therapy, individual counseling and family counseling.  A multidisciplinary team of the [facility] healthcare professionals, headed by the Medical Director, designs an individualized treatment program to suit the needs of each patient. The length of stay in this program is determined by the goals and objectives set by the treatment team, but usually is 6 to 10 weeks. Direct admission to IOP is possible after an individual evaluation by our assessment team. Chemical usage patterns, the progressionof disease in the individual, the possible need for detoxification and the availability of an external support system are factors in determining who is appropriate for. direct OP admission. Each person has the opportunity to embrace a lifestyle of on-going recovery, family involvement and attending a 12 Step Support group. Program Objectives Include  Chemicaldependency education Abstinence for all mind-altering chemicals 12 Step support Relapse Prevention Planning Improved communication skills Spirituality and personal fulfilment Mondav-Wednesday-Friday 1:00pm to 4:00pm Individual and Family Sessions are set by the Counselor ANN EVANS, LCAS, NCC, MA Licensed Clinical Addictions Spcialist [phone]    [facility] Chemical Depencency Intensive OutpatientPrograms Alcohol & Drug Services (ADS) [address] [place] [address] [place]: [phone] [place]: [phone] : [phone] [place]: [phone] Both daytime & evening counseling available Accepts Medicaid and other insurances  [facility] [phone] Meets four times a week for 2 1?2 -3 hours each session Both daytime & evening counseling available Accepts Medicaid and other insurances  [facility] Health CD-IOP - Outpatient Services [address] [place] [address] [phone] Meets 3 days a week for 4 hours Accepts Medicare and  other insurances (not Medicaid)  [employer] [facility] Center [address] [place] [address] [phone]  These referrals have been provided to you as appropriate for your clinical needs while taking into account your financial concerns. Please be aware that agencies, practitioners and insurance companies sometimes change contracts. When calling to make an appointment have your insurance information available so the professional you aregoing to see can confirm whether they are covered by your plan. Take this form with you in case the person you are seeing needs a copy or to contact us. Assessment Counselor   [facility] Outpatient Programs [facility] Health Outpatient Chemical Dependence Intensive Outpatient Program [address] [place], [place] [address] [phone] Private insurance, Medicare A&B, and [facility] ADS (Alcohol and Drug Services) [address], [place], [place] [address] [phone] Medicaid, Self Pay  [facility] [address] [place]y Ripa [phone] Private Insurance, Self Pay  [facility] Partial Hospitalization Program [address] [place], [place] [address] [phone] Private Insurance, Medicaid-[place] and Loann Quill only for partial [facility] [phone] [place], [place] [phone] Private Insurance and Self Pay  [facility] 3 7 1  [facility] [address] [place], [place] [phone] Private Insurance, and Self Pay.  Theatre stage manager (Partial Hospitalization) [address] [place] [address] [phone] [facility] Outpatient [address] [employer] [address] [employer], [place], and Self Pay  [facility]: [facility]one Clinic [address] [place], [place], [place] [address] [phone] [address] [address] [place], [place] [address] Phone: [phone] or [phone] [facility] Services [address] [place], [place] [address] [phone] Medicaid, private insurance [facility] [address] [place], [place] [address] [phone] or [phone] Private Insurance Only  [facility] [address] [phone] Self Pay only, sliding scale  [facility] [address] [place], [place] [address] [phone] State funds for uninsured [facility]. [place], [place] [phone] Private Insurance, and Self pay [employer], some Medicaid Crisis Mobile: [facility]: [phone] (for crisis response 24 hours a day) [facility] [phone]   [facility] Ng [phone] Private Insurance, and [facility]  [facility] [address] [place], [place] [phone] Private Insurance, and [place]. [facility]: [phone] ASAP: Adolescent Substance Abuse Program Males ages: 12-17 ASAP: Adolescent Substance abuse Program Females: 12-17 [facility] [employer] Structured Day Program [phone] CrisisMobile: [facility]: [phone] (for crisis response 24 hours a day) [facility] [phone]

## 2024-02-09 NOTE — Assessment & Plan Note (Signed)
 -  We will continue PPI therapy

## 2024-02-09 NOTE — Progress Notes (Signed)
*  PRELIMINARY RESULTS* Echocardiogram 2D Echocardiogram has been performed.  Carolyne Fiscal 02/09/2024, 12:43 PM

## 2024-02-09 NOTE — Hospital Course (Signed)
 Ethan Wells is a 53 y.o. male with medical history significant for coronary artery disease status post PCI and stents, CHF, alcohol abuse, paroxysmal atrial fibrillation, GERD, peripheral neuropathy, dyslipidemia, and and depression, presented to the emergency room with acute onset of substernal chest pain graded 10/10 in severity with associated nausea and vomiting without diaphoresis.  He admitted to mild dyspnea and palpitations.  Upon arriving the hospital, his alcohol level was 342, troponin 159 peak, patient also developed hypotension, was given IV fluid bolus. Patient became agitated and confused the night of 2/24, treated for alcohol withdrawal.

## 2024-02-09 NOTE — Progress Notes (Signed)
 Updated NP Jawo about patient's low blood pressure. Patient is hallucinating and eloping from the bed. Patient is asking for morphine. Explained to patient that his blood pressure is low. He states that is normal. Received orders to continue ivf and administer midodrine.

## 2024-02-09 NOTE — ED Notes (Signed)
 Admission MD messaged in regard to pt's BP trending down

## 2024-02-09 NOTE — Progress Notes (Signed)
  Progress Note   [name] GNF:[id number] DOB: 1971-05-18 DOA: 02/08/2024     0 DOS: the patient was seen and examined on 02/09/2024   Brief hospital course: Ethan Wells is a 53 y.o. male with medical history significant for coronary artery disease status post PCI and stents, CHF, alcohol abuse, paroxysmal atrial fibrillation, GERD, peripheral neuropathy, dyslipidemia, and and depression, presented to the emergency room with acute onset of substernal chest pain graded 10/10 in severity with associated nausea and vomiting without diaphoresis.  He admitted to mild dyspnea and palpitations.  Upon arriving the hospital, his alcohol level was 342, troponin 159 peak, patient also developed hypotension, was given IV fluid bolus.   Active Problems:   NSTEMI (non-ST elevated myocardial infarction) (HCC)   Dyslipidemia   GERD without esophagitis   Hypokalemia   Depression   Pancytopenia (HCC)   Hypomagnesemia   PAF (paroxysmal atrial fibrillation) (HCC)   Class 1 obesity   Assessment and Plan:  NSTEMI (non-ST elevated myocardial infarction) (HCC) History of coronary disease with stents. Patient had a persistent chest pain, troponin elevated.  Cardiology consult obtained.  Patient currently on heparin drip. I will also obtain CT angiogram to rule out a PE.  Alcohol abuse. Alcohol liver cirrhosis without ascites  Pancytopenia secondary to liver disease. Continue CIWA protocol, patient has significant anemia and thrombocytopenia.  This most likely secondary to liver cirrhosis from alcohol. Also check iron and B12 level. Patient had a small amount of hemorrhoids bleeding, no upper GI bleed.  Paroxysmal atrial fibrillation. Currently on heparin drip.  Hypokalemia. Metabolic acidosis. Replete potassium, metabolic acidosis improved.  Hypotension. Patient received IV fluid bolus for hypotension.  Currently has no evidence of infection, does not suspect sepsis. Will  check TSH and cortisol level.  Continue some IV fluids.  Obesity.  Class I. BMI 32.12, diet and exercise.   Depression - We will continue Zoloft and Seroquel   GERD without esophagitis - We will continue PPI therapy.  Dyslipidemia - We will continue statin therapy as well as Zetia      Subjective:  Patient no longer has any chest pain.  Not short of breath.  He denies any diarrhea or nausea vomiting.  Physical Exam: Vitals:   02/09/24 1100 02/09/24 1143 02/09/24 1200 02/09/24 1230  BP: (!) 97/53 (!) 96/49 (!) 100/52 (!) 98/54  Pulse: 83 79 74 77  Resp: 18  13 13   Temp:      TempSrc:      SpO2: 94%  94% 95%  Weight:      Height:       General exam: Appears calm and comfortable  Respiratory system: Clear to auscultation. Respiratory effort normal. Cardiovascular system: S1 & S2 heard, RRR. No JVD, murmurs, rubs, gallops or clicks. No pedal edema. Gastrointestinal system: Abdomen is nondistended, soft and nontender. No organomegaly or masses felt. Normal bowel sounds heard. Central nervous system: Alert and oriented. No focal neurological deficits. Extremities: Symmetric 5 x 5 power. Skin: No rashes, lesions or ulcers Psychiatry: Judgement and insight appear normal. Mood & affect appropriate.    Data Reviewed:  Lab Results, chest x-ray reviewed.  Family Communication: None  Disposition: Status is: Observation The patient will require care spanning > 2 midnights and should be moved to inpatient because: Severity of disease, IV treatment.     Time spent: 50 minutes  Author: Marrion Coy, MD 02/09/2024 1:38 PM  For on call review [email or web address].

## 2024-02-09 NOTE — Assessment & Plan Note (Signed)
-   The patient will be admitted to an observation cardiac telemetry bed. - Has associated coronary artery disease status post PCI and 2 stents. - Will follow serial troponins and EKGs. - The patient will be placed on aspirin as well as p.r.n. sublingual nitroglycerin and morphine sulfate for pain. - We will place him on IV heparin without bolus. - High-dose statin therapy will be given as well as beta-blocker therapy with Lopressor. - We will obtain a cardiology consult. - I notified [facility] group about the patient

## 2024-02-09 NOTE — TOC Initial Note (Signed)
 Transition of Care [facility]) - Initial/Assessment Note    Patient Details  Name: Ethan Wells MRN: [id number] Date of Birth: [birth year]  [facility]) CM/SW Contact:    Margarito Liner, LCSW Phone Number: 02/09/2024, 10:10 AM  Clinical Narrative:  CSW met with patient. No supports at bedside. CSW introduced role and inquired about interest in SA resources. He is agreeable. CSW added to his AVS. No further concerns. CSW will continue to follow patient for support and facilitate return home once stable. He will need a cab voucher at discharge to [address], [place], [place] [address]. He confirmed he cannot afford to pay for his own ride.                 Expected Discharge Plan: Home/Self Care Barriers to Discharge: Continued Medical Work up   Patient Goals and CMS Choice            Expected Discharge Plan and Services     Post Acute Care Choice: NA Living arrangements for the past 2 months: Single Family Home                                      Prior Living Arrangements/Services Living arrangements for the past 2 months: Single Family Home Lives with:: Parents Patient language and need for interpreter reviewed:: Yes Do you feel safe going back to the place where you live?: Yes      Need for Family Participation in Patient Care: Yes (Comment) Care giver support system in place?: Yes (comment)   Criminal Activity/Legal Involvement Pertinent to Current Situation/Hospitalization: No - Comment as needed  Activities of Daily Living   ADL Screening (condition at time of admission) Independently performs ADLs?: No Does the patient have a NEW difficulty with bathing/dressing/toileting/self-feeding that is expected to last >3 days?: No Does the patient have a NEW difficulty with getting in/out of bed, walking, or climbing stairs that is expected to last >3 days?: No Does the patient have a NEW difficulty with communication that is expected to last >3 days?:  No Is the patient deaf or have difficulty hearing?: No Does the patient have difficulty seeing, even when wearing glasses/contacts?: No Does the patient have difficulty concentrating, remembering, or making decisions?: Yes  Permission Sought/Granted                  Emotional Assessment Appearance:: Appears stated age Attitude/Demeanor/Rapport: Engaged, Gracious Affect (typically observed): Accepting Orientation: : Oriented to Self, Oriented to Place, Oriented to  Time, Oriented to Situation Alcohol / Substance Use: Alcohol Use Psych Involvement: No (comment)  Admission diagnosis:  Chest pain [R07.9] Patient Active Problem List   Diagnosis Date Noted   NSTEMI (non-ST elevated myocardial infarction) (HCC) 02/09/2024   Dyslipidemia 02/09/2024   GERD without esophagitis 02/09/2024   Hypokalemia 02/09/2024   Depression 02/09/2024   Pancytopenia (HCC) 02/09/2024   Hypomagnesemia 02/09/2024   PAF (paroxysmal atrial fibrillation) (HCC) 02/09/2024   Class 1 obesity 02/09/2024   Chest pain 02/08/2024   PCP:  Pcp, No Pharmacy:  No Pharmacies Listed    Social Drivers of Health (SDOH) Social History: SDOH Screenings   Food Insecurity: No Food Insecurity (02/09/2024)  Housing: Low Risk  (02/09/2024)  Transportation Needs: No Transportation Needs (02/09/2024)  Utilities: Not At Risk (02/09/2024)  Social Connections: Unknown (02/09/2024)  Tobacco Use: High Risk (02/08/2024)   SDOH Interventions:  Readmission Risk Interventions     No data to display

## 2024-02-09 NOTE — Progress Notes (Signed)
 PHARMACY - ANTICOAGULATION CONSULT NOTE  Pharmacy Consult for Heparin Infusion Indication: chest pain/ACS  Allergies  Allergen Reactions   Nsaids Other (See Comments)    GI issues  Pt takes ASA 81 mg daily    Penicillins Hives    Patient Measurements: Height: 5\' 5"  (165.1 cm) Weight: 87.5 kg (193 lb) IBW/kg (Calculated) : 61.5 Heparin Dosing Weight: 80.1 kg  Vital Signs: Temp: 97.7 F (36.5 C) (02/24 1434) Temp Source: Oral (02/24 1434) BP: 102/60 (02/24 1605) Pulse Rate: 86 (02/24 1605)  Labs: Recent Labs    02/08/24 1739 02/08/24 1759 02/08/24 2025 02/08/24 2222 02/08/24 2315 02/09/24 0208 02/09/24 0500 02/09/24 1612  HGB 9.1*  --   --   --   --   --  8.0* 8.2*  HCT 27.8*  --   --   --   --   --  25.3* 25.7*  PLT 105*  --   --   --   --   --  91* 85*  APTT  --   --   --  46*  --   --   --   --   LABPROT  --  17.9*  --  17.3*  --   --   --   --   INR  --  1.5*  --  1.4*  --   --   --   --   HEPARINUNFRC  --   --   --   --   --   --  0.20* 0.30  CREATININE 0.73  --   --   --   --   --  0.56*  --   TROPONINIHS 19*  --  90*  --  159* 154*  --   --     Estimated Creatinine Clearance: 111.1 mL/min (A) (by C-G formula based on SCr of 0.56 mg/dL (L)).   Medical History: Past Medical History:  Diagnosis Date   Myocardial infarct (HCC)    patietn states    Medications:  Patient not on anticoagulation at home per chart review  Assessment: Patient is a 53 year old male with alcohol abuse, alcoholic liver cirrhosis, portal hypertension status post TIPS, polysubstance abuse, orthostatic hypotension on midodrine, SMA stenosis, HTN, HLD, CAD, STEMI, chronic HFpEF, and GERD. He arrived to ED complain of chest pain. Aspirin 325 mg administered in route to hospital via EMS. Per EMS, patient has a history of GI issues and bleeding in stools. Troponin 19>90. Pharmacy was consulted to initiate this patient on a heparin infusion. Per conversation with MD, will not give  heparin boluses given history of GIB.   Baseline INR and aPTT ordered.  No signs/symptoms of bleeding noted in chart. Hgb 9.1. PLT 105.  Goal of Therapy:  Heparin level 0.3-0.7 units/ml Monitor platelets by anticoagulation protocol: Yes   02/24 0500 HL 0.20, subtherapeutic 02/24 1612 HL 0.30, therapeutic x 1  Plan:  Per conversation with MD, will not give heparin boluses given history of GIB.  Continue heparin infusion at 1100 units/hr Check confirmatory heparin level 6 hours Monitor CBC daily while on heparin  Will M. Dareen Piano, PharmD Clinical Pharmacist 02/09/2024 4:54 PM

## 2024-02-09 NOTE — Progress Notes (Signed)
 PHARMACY - ANTICOAGULATION CONSULT NOTE  Pharmacy Consult for Heparin Infusion Indication: chest pain/ACS  Allergies  Allergen Reactions   Nsaids Other (See Comments)    GI issues  Pt takes ASA 81 mg daily    Penicillins Hives    Patient Measurements: Height: 5\' 5"  (165.1 cm) Weight: 87.5 kg (193 lb) IBW/kg (Calculated) : 61.5 Heparin Dosing Weight: 80.1 kg  Vital Signs: Temp: 97.9 F (36.6 C) (02/24 0702) Temp Source: Oral (02/24 0702) BP: 89/48 (02/24 0700) Pulse Rate: 77 (02/24 0720)  Labs: Recent Labs    02/08/24 1739 02/08/24 1759 02/08/24 2025 02/08/24 2222 02/08/24 2315 02/09/24 0208 02/09/24 0500  HGB 9.1*  --   --   --   --   --  8.0*  HCT 27.8*  --   --   --   --   --  25.3*  PLT 105*  --   --   --   --   --  91*  APTT  --   --   --  46*  --   --   --   LABPROT  --  17.9*  --  17.3*  --   --   --   INR  --  1.5*  --  1.4*  --   --   --   HEPARINUNFRC  --   --   --   --   --   --  0.20*  CREATININE 0.73  --   --   --   --   --   --   TROPONINIHS 19*  --  90*  --  159* 154*  --     Estimated Creatinine Clearance: 111.1 mL/min (by C-G formula based on SCr of 0.73 mg/dL).   Medical History: Past Medical History:  Diagnosis Date   Myocardial infarct (HCC)    patietn states    Medications:  Patient not on anticoagulation at home per chart review  Assessment: Patient is a 53 year old male with alcohol abuse, alcoholic liver cirrhosis, portal hypertension status post TIPS, polysubstance abuse, orthostatic hypotension on midodrine, SMA stenosis, HTN, HLD, CAD, STEMI, chronic HFpEF, and GERD. He arrived to ED complain of chest pain. Aspirin 325 mg administered in route to hospital via EMS. Per EMS, patient has a history of GI issues and bleeding in stools. Troponin 19>90. Pharmacy was consulted to initiate this patient on a heparin infusion. Per conversation with MD, will not give heparin boluses given history of GIB.   Baseline INR and aPTT  ordered.  No signs/symptoms of bleeding noted in chart. Hgb 9.1. PLT 105.  Goal of Therapy:  Heparin level 0.3-0.7 units/ml Monitor platelets by anticoagulation protocol: Yes   02/24 0500 HL 0.20  Plan:  Per conversation with MD, will not give heparin boluses given history of GIB.  HL subtherapeutic at 0.20 Hgb 9.1 to 8, repeat CBC 2/24 @ 1700 Increase heparin infusion rate to 1100 units/hr Check heparin level 6 hours after rate change Monitor CBC daily while on heparin   Barrie Folk, PharmD Clinical Pharmacist  02/09/2024,7:36 AM

## 2024-02-09 NOTE — Assessment & Plan Note (Signed)
-   We will continue Zoloft and Seroquel. 

## 2024-02-10 DIAGNOSIS — D500 Iron deficiency anemia secondary to blood loss (chronic): Secondary | ICD-10-CM | POA: Diagnosis not present

## 2024-02-10 DIAGNOSIS — F32A Depression, unspecified: Secondary | ICD-10-CM | POA: Diagnosis present

## 2024-02-10 DIAGNOSIS — I110 Hypertensive heart disease with heart failure: Secondary | ICD-10-CM | POA: Diagnosis present

## 2024-02-10 DIAGNOSIS — I480 Paroxysmal atrial fibrillation: Secondary | ICD-10-CM | POA: Diagnosis present

## 2024-02-10 DIAGNOSIS — I472 Ventricular tachycardia, unspecified: Secondary | ICD-10-CM | POA: Diagnosis not present

## 2024-02-10 DIAGNOSIS — F10239 Alcohol dependence with withdrawal, unspecified: Secondary | ICD-10-CM | POA: Diagnosis present

## 2024-02-10 DIAGNOSIS — Z6832 Body mass index (BMI) 32.0-32.9, adult: Secondary | ICD-10-CM | POA: Diagnosis not present

## 2024-02-10 DIAGNOSIS — N179 Acute kidney failure, unspecified: Secondary | ICD-10-CM | POA: Diagnosis not present

## 2024-02-10 DIAGNOSIS — F1410 Cocaine abuse, uncomplicated: Secondary | ICD-10-CM | POA: Diagnosis present

## 2024-02-10 DIAGNOSIS — I5032 Chronic diastolic (congestive) heart failure: Secondary | ICD-10-CM | POA: Diagnosis present

## 2024-02-10 DIAGNOSIS — D61818 Other pancytopenia: Secondary | ICD-10-CM | POA: Diagnosis present

## 2024-02-10 DIAGNOSIS — I214 Non-ST elevation (NSTEMI) myocardial infarction: Secondary | ICD-10-CM | POA: Diagnosis present

## 2024-02-10 DIAGNOSIS — R34 Anuria and oliguria: Secondary | ICD-10-CM | POA: Diagnosis not present

## 2024-02-10 DIAGNOSIS — K551 Chronic vascular disorders of intestine: Secondary | ICD-10-CM | POA: Diagnosis present

## 2024-02-10 DIAGNOSIS — K921 Melena: Secondary | ICD-10-CM | POA: Diagnosis present

## 2024-02-10 DIAGNOSIS — K7030 Alcoholic cirrhosis of liver without ascites: Secondary | ICD-10-CM | POA: Diagnosis present

## 2024-02-10 DIAGNOSIS — Y908 Blood alcohol level of 240 mg/100 ml or more: Secondary | ICD-10-CM | POA: Diagnosis present

## 2024-02-10 DIAGNOSIS — K766 Portal hypertension: Secondary | ICD-10-CM | POA: Diagnosis present

## 2024-02-10 DIAGNOSIS — F10920 Alcohol use, unspecified with intoxication, uncomplicated: Secondary | ICD-10-CM | POA: Diagnosis not present

## 2024-02-10 DIAGNOSIS — G629 Polyneuropathy, unspecified: Secondary | ICD-10-CM | POA: Diagnosis present

## 2024-02-10 DIAGNOSIS — E872 Acidosis, unspecified: Secondary | ICD-10-CM | POA: Diagnosis present

## 2024-02-10 DIAGNOSIS — G928 Other toxic encephalopathy: Secondary | ICD-10-CM | POA: Diagnosis not present

## 2024-02-10 DIAGNOSIS — F1411 Cocaine abuse, in remission: Secondary | ICD-10-CM | POA: Diagnosis not present

## 2024-02-10 DIAGNOSIS — E876 Hypokalemia: Secondary | ICD-10-CM | POA: Diagnosis not present

## 2024-02-10 DIAGNOSIS — F10220 Alcohol dependence with intoxication, uncomplicated: Secondary | ICD-10-CM | POA: Diagnosis present

## 2024-02-10 DIAGNOSIS — I959 Hypotension, unspecified: Secondary | ICD-10-CM | POA: Diagnosis not present

## 2024-02-10 DIAGNOSIS — R079 Chest pain, unspecified: Secondary | ICD-10-CM | POA: Diagnosis present

## 2024-02-10 DIAGNOSIS — K7031 Alcoholic cirrhosis of liver with ascites: Secondary | ICD-10-CM | POA: Diagnosis not present

## 2024-02-10 DIAGNOSIS — E785 Hyperlipidemia, unspecified: Secondary | ICD-10-CM | POA: Diagnosis present

## 2024-02-10 DIAGNOSIS — I4901 Ventricular fibrillation: Secondary | ICD-10-CM | POA: Diagnosis not present

## 2024-02-10 LAB — COMPREHENSIVE METABOLIC PANEL
ALT: 13 U/L (ref 0–44)
AST: 67 U/L — ABNORMAL HIGH (ref 15–41)
Albumin: 2.5 g/dL — ABNORMAL LOW (ref 3.5–5.0)
Alkaline Phosphatase: 197 U/L — ABNORMAL HIGH (ref 38–126)
Anion gap: 7 (ref 5–15)
BUN: 7 mg/dL (ref 6–20)
CO2: 20 mmol/L — ABNORMAL LOW (ref 22–32)
Calcium: 7.7 mg/dL — ABNORMAL LOW (ref 8.9–10.3)
Chloride: 109 mmol/L (ref 98–111)
Creatinine, Ser: 0.63 mg/dL (ref 0.61–1.24)
GFR, Estimated: >60 mL/min (ref >60)
Glucose, Bld: 95 mg/dL (ref 70–99)
Potassium: 3.6 mmol/L (ref 3.5–5.1)
Sodium: 136 mmol/L (ref 135–145)
Total Bilirubin: 6.1 mg/dL — ABNORMAL HIGH (ref 0.0–1.2)
Total Protein: 6.7 g/dL (ref 6.5–8.1)

## 2024-02-10 LAB — MAGNESIUM: Magnesium: 1.6 mg/dL — ABNORMAL LOW (ref 1.7–2.4)

## 2024-02-10 LAB — CBC
HCT: 23.2 % — ABNORMAL LOW (ref 39.0–52.0)
Hemoglobin: 7.7 g/dL — ABNORMAL LOW (ref 13.0–17.0)
MCH: 29.1 pg (ref 26.0–34.0)
MCHC: 33.2 g/dL (ref 30.0–36.0)
MCV: 87.5 fL (ref 80.0–100.0)
Platelets: 82 10*3/uL — ABNORMAL LOW (ref 150–400)
RBC: 2.65 MIL/uL — ABNORMAL LOW (ref 4.22–5.81)
RDW: 20.9 % — ABNORMAL HIGH (ref 11.5–15.5)
WBC: 5.6 10*3/uL (ref 4.0–10.5)
nRBC: 0 % (ref 0.0–0.2)

## 2024-02-10 LAB — HEPARIN LEVEL (UNFRACTIONATED)
Heparin Unfractionated: 0.29 [IU]/mL — ABNORMAL LOW (ref 0.30–0.70)
Heparin Unfractionated: 0.39 [IU]/mL (ref 0.30–0.70)

## 2024-02-10 LAB — PHOSPHORUS: Phosphorus: 1.3 mg/dL — ABNORMAL LOW (ref 2.5–4.6)

## 2024-02-10 LAB — HEMOGLOBIN: Hemoglobin: 7.6 g/dL — ABNORMAL LOW (ref 13.0–17.0)

## 2024-02-10 LAB — CORTISOL-AM, BLOOD: Cortisol - AM: 5.9 ug/dL — ABNORMAL LOW (ref 6.7–22.6)

## 2024-02-10 MED ORDER — NICOTINE 7 MG/24HR TD PT24
7.0000 mg | MEDICATED_PATCH | Freq: Every day | TRANSDERMAL | Status: DC
  Administered 2024-02-10 – 2024-02-17 (×8): 7 mg via TRANSDERMAL
  Filled 2024-02-10 (×10): qty 1

## 2024-02-10 MED ORDER — POTASSIUM & SODIUM PHOSPHATES 280-160-250 MG PO PACK: 1.0000 | PACK | Freq: Three times a day (TID) | ORAL | Status: DC

## 2024-02-10 MED ORDER — HEPARIN BOLUS VIA INFUSION
1200.0000 [IU] | Freq: Once | INTRAVENOUS | Status: DC
  Filled 2024-02-10: qty 1200

## 2024-02-10 MED ORDER — POTASSIUM PHOSPHATES 15 MMOLE/5ML IV SOLN
30.0000 mmol | Freq: Once | INTRAVENOUS | Status: AC
  Administered 2024-02-10: 30 mmol via INTRAVENOUS
  Filled 2024-02-10: qty 10

## 2024-02-10 MED ORDER — MAGNESIUM SULFATE 2 GM/50ML IV SOLN
2.0000 g | Freq: Once | INTRAVENOUS | Status: AC
  Administered 2024-02-10: 2 g via INTRAVENOUS
  Filled 2024-02-10: qty 50

## 2024-02-10 MED ORDER — SODIUM BICARBONATE 650 MG PO TABS
650.0000 mg | ORAL_TABLET | Freq: Two times a day (BID) | ORAL | Status: DC
  Administered 2024-02-10 – 2024-02-15 (×12): 650 mg via ORAL
  Filled 2024-02-10 (×12): qty 1

## 2024-02-10 MED ORDER — PANTOPRAZOLE SODIUM 40 MG IV SOLR
40.0000 mg | Freq: Two times a day (BID) | INTRAVENOUS | Status: DC
  Administered 2024-02-10 – 2024-02-15 (×12): 40 mg via INTRAVENOUS
  Filled 2024-02-10 (×12): qty 10

## 2024-02-10 NOTE — Care Management Obs Status (Signed)
 MEDICARE OBSERVATION STATUS NOTIFICATION   Patient Details  Name: Ethan Wells MRN: [id number] Date of Birth: 20-Feb-1971   Medicare Observation Status Notification Given:  Orland Dec, CMA 02/10/2024, 9:30 AM

## 2024-02-10 NOTE — Progress Notes (Signed)
 Progress Note   [name] [id number]:[id number] DOB: 03-08-1971 DOA: 02/08/2024     0 DOS: the patient was seen and examined on 02/10/2024   Brief hospital course: Lexington Krotz is a 53 y.o. male with medical history significant for coronary artery disease status post PCI and stents, CHF, alcohol abuse, paroxysmal atrial fibrillation, GERD, peripheral neuropathy, dyslipidemia, and and depression, presented to the emergency room with acute onset of substernal chest pain graded 10/10 in severity with associated nausea and vomiting without diaphoresis.  He admitted to mild dyspnea and palpitations.  Upon arriving the hospital, his alcohol level was 342, troponin 159 peak, patient also developed hypotension, was given IV fluid bolus. Patient became agitated and confused the night of 2/24, treated for alcohol withdrawal.    Active Problems:   NSTEMI (non-ST elevated myocardial infarction) (HCC)   Dyslipidemia   GERD without esophagitis   Hypokalemia   Depression   Pancytopenia (HCC)   Hypomagnesemia   PAF (paroxysmal atrial fibrillation) (HCC)   Class 1 obesity   Alcoholic cirrhosis of liver with ascites (HCC)   Assessment and Plan:  NSTEMI (non-ST elevated myocardial infarction) (HCC) History of coronary disease with stents. Patient had a persistent chest pain, troponin elevated.  Cardiology consult obtained.  Patient is not a candidate for cardiac workup due to alcohol abuse. Patient was initially treated with heparin drip, hemoglobin started dropping, discussed with cardiology, discontinue heparin drip. Patient chest pain seem to be better today.   Alcohol abuse with alcohol withdrawal. Alcohol liver cirrhosis without ascites  Pancytopenia secondary to liver disease. Continue CIWA protocol, patient has significant anemia and thrombocytopenia.  This most likely secondary to liver cirrhosis from alcohol. Patient does not have iron and B12 deficiency.  Hemoglobin is lower  today, will check hemoglobin every 12 hours, transfuse as needed.  Currently, patient had a small amount of rectal bleeding moist, no black stool.  Also started Protonix just in case. Patient developed significant effusion and agitation from alcohol withdrawal.  Continue CIWA protocol, also check ammonia level in the morning. Continue some of the fluids as patient has not been able to eat reliably.   Paroxysmal atrial fibrillation. Discontinue heparin drip due to drop of hemoglobin.   Hypokalemia. Metabolic acidosis. Hypomagnesemia. Hypophosphatemia. Replete magnesium and phosphate.  Monitor levels.  Potassium normalized.  Patient still has mild metabolic acidosis, start sodium bicarb orally   Hypotension. Patient received IV fluid bolus for hypotension.  Currently has no evidence of infection, does not suspect sepsis. Cortisol level 6.5, patient has a relative adrenal insufficiency.  Blood pressure has been better after bolus, may consider steroids if blood pressure running low again.   Obesity.  Class I. BMI 32.12, diet and exercise.     Depression - We will continue Zoloft and Seroquel     GERD without esophagitis - We will continue PPI therapy.   Dyslipidemia - We will continue statin therapy as well as Zetia       Subjective:  Patient has some confusion today, denies any short of breath or chest pain.  Physical Exam: Vitals:   02/10/24 0428 02/10/24 0431 02/10/24 0501 02/10/24 0749  BP: (!) 112/52 (!) 112/58 (!) 112/58 (!) 116/52  Pulse: 90 87 87 87  Resp: 19  18   Temp: 98.1 F (36.7 C)  97.8 F (36.6 C) 98.6 F (37 C)  TempSrc: Oral   Oral  SpO2: 97%  98% 97%  Weight:      Height:  General exam: Appears calm and comfortable  Respiratory system: Clear to auscultation. Respiratory effort normal. Cardiovascular system: S1 & S2 heard, RRR. No JVD, murmurs, rubs, gallops or clicks. No pedal edema. Gastrointestinal system: Abdomen is nondistended, soft  and nontender. No organomegaly or masses felt. Normal bowel sounds heard. Central nervous system: Alert and oriented x2. No focal neurological deficits. Extremities: Symmetric 5 x 5 power. Skin: No rashes, lesions or ulcers Psychiatry: Mood & affect appropriate.    Data Reviewed:  Lab results reviewed.  Family Communication: None  Disposition: Status is: Observation      Time spent: 35 minutes  Author: Marrion Coy, MD 02/10/2024 11:02 AM  For on call review [email or web address].

## 2024-02-10 NOTE — Plan of Care (Signed)
  Problem: Education: Goal: Understanding of cardiac disease, CV risk reduction, and recovery process will improve Outcome: Not Progressing   Problem: Activity: Goal: Ability to tolerate increased activity will improve Outcome: Progressing   Problem: Cardiac: Goal: Ability to achieve and maintain adequate cardiovascular perfusion will improve Outcome: Not Progressing   Problem: Health Behavior/Discharge Planning: Goal: Ability to safely manage health-related needs after discharge will improve Outcome: Not Progressing

## 2024-02-10 NOTE — Progress Notes (Signed)
 PHARMACY - ANTICOAGULATION CONSULT NOTE  Pharmacy Consult for Heparin Infusion Indication: chest pain/ACS  Allergies  Allergen Reactions   Nsaids Other (See Comments)    GI issues  Pt takes ASA 81 mg daily    Penicillins Hives    Patient Measurements: Height: 5\' 5"  (165.1 cm) Weight: 87.5 kg (193 lb) IBW/kg (Calculated) : 61.5 Heparin Dosing Weight: 80.1 kg  Vital Signs: Temp: 98.1 F (36.7 C) (02/24 2332) Temp Source: Oral (02/24 1434) BP: 81/49 (02/24 2332) Pulse Rate: 77 (02/24 2332)  Labs: Recent Labs    02/08/24 1739 02/08/24 1759 02/08/24 2025 02/08/24 2222 02/08/24 2315 02/09/24 0208 02/09/24 0500 02/09/24 1612 02/09/24 2318  HGB 9.1*  --   --   --   --   --  8.0* 8.2*  --   HCT 27.8*  --   --   --   --   --  25.3* 25.7*  --   PLT 105*  --   --   --   --   --  91* 85*  --   APTT  --   --   --  46*  --   --   --   --   --   LABPROT  --  17.9*  --  17.3*  --   --   --   --   --   INR  --  1.5*  --  1.4*  --   --   --   --   --   HEPARINUNFRC  --   --   --   --   --   --  0.20* 0.30 0.29*  CREATININE 0.73  --   --   --   --   --  0.56*  --   --   TROPONINIHS 19*  --  90*  --  159* 154*  --   --   --     Estimated Creatinine Clearance: 111.1 mL/min (A) (by C-G formula based on SCr of 0.56 mg/dL (L)).   Medical History: Past Medical History:  Diagnosis Date   Myocardial infarct (HCC)    patietn states    Medications:  Patient not on anticoagulation at home per chart review  Assessment: Patient is a 53 year old male with alcohol abuse, alcoholic liver cirrhosis, portal hypertension status post TIPS, polysubstance abuse, orthostatic hypotension on midodrine, SMA stenosis, HTN, HLD, CAD, STEMI, chronic HFpEF, and GERD. He arrived to ED complain of chest pain. Aspirin 325 mg administered in route to hospital via EMS. Per EMS, patient has a history of GI issues and bleeding in stools. Troponin 19>90. Pharmacy was consulted to initiate this patient on a  heparin infusion. Per conversation with MD, will not give heparin boluses given history of GIB.   Baseline INR and aPTT ordered.  No signs/symptoms of bleeding noted in chart. Hgb 9.1. PLT 105.  Goal of Therapy:  Heparin level 0.3-0.7 units/ml Monitor platelets by anticoagulation protocol: Yes   02/24 0500 HL 0.20, subtherapeutic 02/24 1612 HL 0.30, therapeutic x 1 02/24 2318 HL 0.29, subtherapeutic   Plan:  Per conversation with MD, will not give heparin boluses given history of GIB.   2/24: HL @ 2318 = 0.29, SUBtherapeutic - Will increase heparin drip to 1250 units/hr and recheck HL 6 hrs after rate change, no bolus per MD request   Jaleiah Asay D Clinical Pharmacist 02/10/2024 12:10 AM

## 2024-02-10 NOTE — Progress Notes (Signed)
 PHARMACY - ANTICOAGULATION CONSULT NOTE  Pharmacy Consult for Heparin Infusion Indication: chest pain/ACS  Allergies  Allergen Reactions   Nsaids Other (See Comments)    GI issues  Pt takes ASA 81 mg daily    Penicillins Hives    Patient Measurements: Height: 5\' 5"  (165.1 cm) Weight: 87.5 kg (193 lb) IBW/kg (Calculated) : 61.5 Heparin Dosing Weight: 80.1 kg  Vital Signs: Temp: 97.8 F (36.6 C) (02/25 0501) Temp Source: Oral (02/25 0428) BP: 112/58 (02/25 0501) Pulse Rate: 87 (02/25 0501)  Labs: Recent Labs    02/08/24 1739 02/08/24 1739 02/08/24 1759 02/08/24 2025 02/08/24 2222 02/08/24 2315 02/09/24 0208 02/09/24 0500 02/09/24 1612 02/09/24 2318 02/10/24 0456  HGB 9.1*  --   --   --   --   --   --  8.0* 8.2*  --  7.7*  HCT 27.8*  --   --   --   --   --   --  25.3* 25.7*  --  23.2*  PLT 105*  --   --   --   --   --   --  91* 85*  --  82*  APTT  --   --   --   --  46*  --   --   --   --   --   --   LABPROT  --   --  17.9*  --  17.3*  --   --   --   --   --   --   INR  --   --  1.5*  --  1.4*  --   --   --   --   --   --   HEPARINUNFRC  --    < >  --   --   --   --   --  0.20* 0.30 0.29* 0.39  CREATININE 0.73  --   --   --   --   --   --  0.56*  --   --  0.63  TROPONINIHS 19*  --   --  90*  --  159* 154*  --   --   --   --    < > = values in this interval not displayed.    Estimated Creatinine Clearance: 111.1 mL/min (by C-G formula based on SCr of 0.63 mg/dL).   Medical History: Past Medical History:  Diagnosis Date   Myocardial infarct (HCC)    patietn states    Medications:  Patient not on anticoagulation at home per chart review  Assessment: Patient is a 53 year old male with alcohol abuse, alcoholic liver cirrhosis, portal hypertension status post TIPS, polysubstance abuse, orthostatic hypotension on midodrine, SMA stenosis, HTN, HLD, CAD, STEMI, chronic HFpEF, and GERD. He arrived to ED complain of chest pain. Aspirin 325 mg administered in route  to hospital via EMS. Per EMS, patient has a history of GI issues and bleeding in stools. Troponin 19>90. Pharmacy was consulted to initiate this patient on a heparin infusion. Per conversation with MD, will not give heparin boluses given history of GIB.   Baseline INR and aPTT ordered.  No signs/symptoms of bleeding noted in chart. Hgb 9.1. PLT 105.  Goal of Therapy:  Heparin level 0.3-0.7 units/ml Monitor platelets by anticoagulation protocol: Yes   02/24 0500 HL 0.20, subtherapeutic 02/24 1612 HL 0.30, therapeutic x 1 02/24 2318 HL 0.29, subtherapeutic  02/25 0456 HL 0.39, therapeutic X 1   Plan:  Per conversation  with MD, will not give heparin boluses given history of GIB.   2/25:  HL @ 0456 = 0.39, therapeutic X 1 - Will continue pt on current rate and recheck HL in 6 hrs.  Vinnie Gombert D Clinical Pharmacist 02/10/2024 6:17 AM

## 2024-02-10 NOTE — Progress Notes (Signed)
 Patient has been uncooperative this shift. Patient is confused and disoriented to location and situation. Patient has made multiple attempts to go to another room. Patient has asked for a cigarette.  Will ask for a nicotine patch. Patient has received morphine for pain and ativan for ciwa. Patient low blood pressure has been addressed this shift too. Will continue to monitor and assess.

## 2024-02-10 NOTE — Progress Notes (Signed)
 Patient Name: Ethan Wells Date of Encounter: 02/10/2024 [facility] Cardiologist: Verne Carrow, MD   Interval Summary  .    Hgb today 7.7. He is still on IV heparin for NSTEMI. BP normal. Reports persistent chest pain.   Vital Signs .    Vitals:   02/10/24 0428 02/10/24 0431 02/10/24 0501 02/10/24 0749  BP: (!) 112/52 (!) 112/58 (!) 112/58 (!) 116/52  Pulse: 90 87 87 87  Resp: 19  18   Temp: 98.1 F (36.7 C)  97.8 F (36.6 C) 98.6 F (37 C)  TempSrc: Oral   Oral  SpO2: 97%  98% 97%  Weight:      Height:        Intake/Output Summary (Last 24 hours) at 02/10/2024 0841 Last data filed at 02/10/2024 0650 Gross per 24 hour  Intake 1029.8 ml  Output 1000 ml  Net 29.8 ml      02/08/2024    5:43 PM  Last 3 Weights  Weight (lbs) 193 lb  Weight (kg) 87.544 kg      Telemetry/ECG    NSR PVCs, HR 80s - Personally Reviewed  Physical Exam .   GEN: No acute distress.   Neck: No JVD Cardiac: RRR, no murmurs, rubs, or gallops.  Respiratory: Clear to auscultation bilaterally. GI: Soft, nontender, non-distended  MS: No edema  Assessment & Plan .     NSTEMI CAD with prior stenting - presented with chest pain that occurred while sleeping, described as a burning sensation, not worse with exertion. Patient drank an entire bottle of wine PTA. Alcohol level 342. Pain improved with pain medication. - HS troponin 19>90>159>154 - prior heart cath 10/2022 showed severe 2V CAD with CTO to the mid left Cx and CTO of the mid RCA, LAD stent placement, collaterals were present, no intervention recommended - Echo 12/28 showed LVEF 50-55%, no WMA - EKG with no significant changes - PTA ASA  81mg  daily. It has been held in the past due to GIB - IV heparin x 48 hours. Hgb 9.1>>7.7. may need to hold IV heparin, although reports persistent chest pain - did not tolerate Imdur in the past due to hypotension. He is on Lopressor 12.5mg BID - continue Crestor - repeat echo  showed LVEF 55-60%, no WMA, mild MR - he is not a good cath candidate given anemia/thrombocytopenia. Suspect demand ischemia in the setting of alcohol use and anemia with underlying CAD   Polysubstance Abuse - denies recent cocaine use - UDS negative - he reports he drank a bottle of wine PTA - CIWA per IM   Chronic hypotension - has required midodrine in the past - last admission he had adrenal crisis with hypotension treated with IV steroids - PTA midodrine 5mg  BID and hydrocortisone - this admission BP has been low, today however has been normal - now on lopressor 12.5mg  BID and lasix 20mg  daily and BP trending soft   Chronic diastolic heart failure - appears euvolemic - echo this admission showed LVEF 55-60%, no WMA, normal diastolic parameters, mild MR - continue lasix 20mg  daily   Hypokalemia - 3.2>3.4>3.6   Anemia - Hgb 9.1>8.0>>7.7 - he reports BRBPR and dark stools, most recent was 2 days ago - on I V heparin>may need to hold if Hgb continues to drop   GERD - continue PPI   For questions or updates, please contact [facility] Please consult [email or web address] for contact info under        Signed, Nhi Butrum H  Fransico Michael, PA-C

## 2024-02-10 NOTE — Plan of Care (Signed)
  Problem: Education: Goal: Understanding of cardiac disease, CV risk reduction, and recovery process will improve Outcome: Progressing Goal: Individualized Educational Video(s) Outcome: Progressing   Problem: Activity: Goal: Ability to tolerate increased activity will improve Outcome: Progressing   Problem: Cardiac: Goal: Ability to achieve and maintain adequate cardiovascular perfusion will improve Outcome: Progressing   Problem: Health Behavior/Discharge Planning: Goal: Ability to safely manage health-related needs after discharge will improve Outcome: Progressing   Problem: Activity: Goal: Risk for activity intolerance will decrease Outcome: Progressing

## 2024-02-11 DIAGNOSIS — F10920 Alcohol use, unspecified with intoxication, uncomplicated: Secondary | ICD-10-CM

## 2024-02-11 DIAGNOSIS — D500 Iron deficiency anemia secondary to blood loss (chronic): Secondary | ICD-10-CM

## 2024-02-11 DIAGNOSIS — F1411 Cocaine abuse, in remission: Secondary | ICD-10-CM

## 2024-02-11 DIAGNOSIS — I5032 Chronic diastolic (congestive) heart failure: Secondary | ICD-10-CM

## 2024-02-11 DIAGNOSIS — R410 Disorientation, unspecified: Secondary | ICD-10-CM

## 2024-02-11 DIAGNOSIS — I959 Hypotension, unspecified: Secondary | ICD-10-CM

## 2024-02-11 DIAGNOSIS — I214 Non-ST elevation (NSTEMI) myocardial infarction: Secondary | ICD-10-CM | POA: Diagnosis not present

## 2024-02-11 DIAGNOSIS — K7031 Alcoholic cirrhosis of liver with ascites: Secondary | ICD-10-CM | POA: Diagnosis not present

## 2024-02-11 LAB — CBC
HCT: 22.3 % — ABNORMAL LOW (ref 39.0–52.0)
Hemoglobin: 7.3 g/dL — ABNORMAL LOW (ref 13.0–17.0)
MCH: 29.6 pg (ref 26.0–34.0)
MCHC: 32.7 g/dL (ref 30.0–36.0)
MCV: 90.3 fL (ref 80.0–100.0)
Platelets: 57 10*3/uL — ABNORMAL LOW (ref 150–400)
RBC: 2.47 MIL/uL — ABNORMAL LOW (ref 4.22–5.81)
RDW: 21.5 % — ABNORMAL HIGH (ref 11.5–15.5)
WBC: 3.7 10*3/uL — ABNORMAL LOW (ref 4.0–10.5)
nRBC: 0 % (ref 0.0–0.2)

## 2024-02-11 LAB — HEMOGLOBIN: Hemoglobin: 7.9 g/dL — ABNORMAL LOW (ref 13.0–17.0)

## 2024-02-11 LAB — AMMONIA: Ammonia: 69 umol/L — ABNORMAL HIGH (ref 9–35)

## 2024-02-11 LAB — COMPREHENSIVE METABOLIC PANEL
ALT: 13 U/L (ref 0–44)
AST: 65 U/L — ABNORMAL HIGH (ref 15–41)
Albumin: 2.3 g/dL — ABNORMAL LOW (ref 3.5–5.0)
Alkaline Phosphatase: 190 U/L — ABNORMAL HIGH (ref 38–126)
Anion gap: 5 (ref 5–15)
BUN: 6 mg/dL (ref 6–20)
CO2: 19 mmol/L — ABNORMAL LOW (ref 22–32)
Calcium: 7.2 mg/dL — ABNORMAL LOW (ref 8.9–10.3)
Chloride: 113 mmol/L — ABNORMAL HIGH (ref 98–111)
Creatinine, Ser: 0.65 mg/dL (ref 0.61–1.24)
GFR, Estimated: >60 mL/min (ref >60)
Glucose, Bld: 94 mg/dL (ref 70–99)
Potassium: 3.3 mmol/L — ABNORMAL LOW (ref 3.5–5.1)
Sodium: 137 mmol/L (ref 135–145)
Total Bilirubin: 4.5 mg/dL — ABNORMAL HIGH (ref 0.0–1.2)
Total Protein: 6.3 g/dL — ABNORMAL LOW (ref 6.5–8.1)

## 2024-02-11 LAB — MAGNESIUM: Magnesium: 1.7 mg/dL (ref 1.7–2.4)

## 2024-02-11 LAB — PHOSPHORUS: Phosphorus: 1.8 mg/dL — ABNORMAL LOW (ref 2.5–4.6)

## 2024-02-11 LAB — PREPARE RBC (CROSSMATCH)

## 2024-02-11 LAB — HEMOGLOBIN AND HEMATOCRIT, BLOOD
HCT: 28.4 % — ABNORMAL LOW (ref 39.0–52.0)
Hemoglobin: 9.1 g/dL — ABNORMAL LOW (ref 13.0–17.0)

## 2024-02-11 MED ORDER — COSYNTROPIN 0.25 MG IJ SOLR
0.2500 mg | Freq: Once | INTRAMUSCULAR | Status: AC
  Administered 2024-02-12: 0.25 mg via INTRAVENOUS
  Filled 2024-02-11: qty 0.25

## 2024-02-11 MED ORDER — MAGNESIUM SULFATE 2 GM/50ML IV SOLN
2.0000 g | Freq: Once | INTRAVENOUS | Status: AC
  Administered 2024-02-11: 2 g via INTRAVENOUS
  Filled 2024-02-11: qty 50

## 2024-02-11 MED ORDER — POTASSIUM CHLORIDE CRYS ER 20 MEQ PO TBCR
40.0000 meq | EXTENDED_RELEASE_TABLET | Freq: Once | ORAL | Status: AC
  Administered 2024-02-11: 40 meq via ORAL
  Filled 2024-02-11: qty 2

## 2024-02-11 MED ORDER — METOPROLOL SUCCINATE ER 25 MG PO TB24
12.5000 mg | ORAL_TABLET | Freq: Every day | ORAL | Status: DC
  Administered 2024-02-12 – 2024-02-17 (×4): 12.5 mg via ORAL
  Filled 2024-02-11 (×6): qty 1

## 2024-02-11 MED ORDER — CHLORDIAZEPOXIDE HCL 25 MG PO CAPS
25.0000 mg | ORAL_CAPSULE | Freq: Four times a day (QID) | ORAL | Status: DC
  Administered 2024-02-11 – 2024-02-12 (×8): 25 mg via ORAL
  Filled 2024-02-11 (×8): qty 1

## 2024-02-11 MED ORDER — POTASSIUM PHOSPHATES 15 MMOLE/5ML IV SOLN
30.0000 mmol | Freq: Once | INTRAVENOUS | Status: AC
  Administered 2024-02-11: 30 mmol via INTRAVENOUS
  Filled 2024-02-11: qty 10

## 2024-02-11 MED ORDER — SODIUM CHLORIDE 0.9% IV SOLUTION: Freq: Once | INTRAVENOUS | Status: DC

## 2024-02-11 NOTE — Progress Notes (Signed)
 Patient continues to be confused to time and situation. Called up to nurse's station to ask if we were open. Educated patient. Patient put on his clothes and caused a skin tear somehow to L-arm near IV. Cleaned area and placed a foam/tape. Patient offered to pay me to remove IV. He stated that there were too many in. I explained to him that we have to keep access for the possibility of an emergency before discharge. Continues to ask if the doctor is coming over and over.

## 2024-02-11 NOTE — Progress Notes (Addendum)
 Progress Note    Ethan Wells  UJW:[id number] DOB: 04/18/1971  DOA: 02/08/2024 PCP: Pcp, No      Brief Narrative:    Medical records reviewed and are as summarized below:   Ethan Wells is a 53 y.o. male with medical history significant for coronary artery disease status post PCI and stents, CHF, alcohol abuse, paroxysmal atrial fibrillation, GERD, peripheral neuropathy, dyslipidemia, and and depression, presented to the emergency room with acute onset of substernal chest pain graded 10/10 in severity with associated nausea and vomiting without diaphoresis.  He admitted to mild dyspnea and palpitations.  Upon arriving the hospital, his alcohol level was 342, troponin 159 peak, patient also developed hypotension, was given IV fluid bolus. Patient became agitated and confused the night of 2/24, treated for alcohol withdrawal.       Assessment/Plan:   Principal Problem:   NSTEMI (non-ST elevated myocardial infarction) (HCC) Active Problems:   Dyslipidemia   GERD without esophagitis   Hypokalemia   Depression   Pancytopenia (HCC)   Hypomagnesemia   PAF (paroxysmal atrial fibrillation) (HCC)   Class 1 obesity   Alcoholic cirrhosis of liver with ascites (HCC)   Alcoholic intoxication without complication (HCC)   Confusion   History of cocaine abuse (HCC)   Blood loss anemia    Body mass index is 32.12 kg/m.  (Obesity)   NSTEMI (non-ST elevated myocardial infarction) (HCC) History of coronary disease with stents. Troponins are from 19-159. Continue low-dose aspirin and high-dose Lipitor He was previously treated with IV heparin drip. Cardiologist recommended conservative management because of alcohol use disorder. 2D echo showed EF estimated at 55 to 60%, if normal LV diastolic parameters, mild MR   Paroxysmal atrial fibrillation Continue metoprolol   Alcohol use disorder with alcohol withdrawal. Acute toxic metabolic encephalopathy with recent  agitation Mental status is better Start scheduled Librium.  Use Ativan as needed per CIWA protocol.   Pancytopenia secondary to liver disease. hemoglobin, WBC and platelet count are trending down.  This is likely from alcohol and liver disease. He also has anemia of chronic disease Hemoglobin is down to 7.3.  Transfuse 1 units of packed red blood cells because of acute NSTEMI.  Discussed risk, benefits and alternatives of blood transfusion with the patient and he agrees to proceed with blood transfusion.  He said he has had blood transfusions in the past.  Hemoglobin was 9.1 on admission. Ammonia level was 69 but doubt hepatic encephalopathy at this time    Hypokalemia. Metabolic acidosis. Hypomagnesemia. Hypophosphatemia. Replete potassium and phosphorus with IV potassium phosphate and oral potassium chloride. Replete magnesium with IV magnesium sulfate   Hypotension. BP stable.  S/p treatment with IV fluids. Cortisol level 6.5.  ACTH stimulation test has been ordered.   Comorbidities include depression, dyslipidemia, GERD,   Diet Order             DIET DYS 3 Room service appropriate? Yes; Fluid consistency: Thin  Diet effective now                            Consultants: Cardiologist  Procedures: None    Medications:    aspirin EC  81 mg Oral Daily   atorvastatin  80 mg Oral Daily   chlordiazePOXIDE  25 mg Oral QID   [START ON 02/12/2024] cosyntropin  0.25 mg Intravenous Once   folic acid  1 mg Oral Daily   furosemide  20 mg Oral  Daily   LORazepam  0-4 mg Intravenous Q12H   [START ON 02/12/2024] metoprolol succinate  12.5 mg Oral Daily   multivitamin with minerals  1 tablet Oral Daily   nicotine  7 mg Transdermal Daily   pantoprazole (PROTONIX) IV  40 mg Intravenous Q12H   QUEtiapine  25 mg Oral QHS   sodium bicarbonate  650 mg Oral BID   thiamine  100 mg Oral Daily   Or   thiamine  100 mg Intravenous Daily   Continuous Infusions:   potassium PHOSPHATE IVPB (in mmol) 30 mmol (02/11/24 1304)     Anti-infectives (From admission, onward)    None              Family Communication/Anticipated D/C date and plan/Code Status   DVT prophylaxis: Place and maintain sequential compression device Start: 02/11/24 1134     Code Status: Full Code  Family Communication: None Disposition Plan: Plan to discharge home   Status is: Inpatient Remains inpatient appropriate because: Alcohol withdrawal syndrome       Subjective:   Interval events noted.  He complains of chest pain and upper back pain.  He said he fell recently prior to admission.  No other complaints.  No bloody stools or vomiting.  Objective:    Vitals:   02/11/24 0831 02/11/24 1111 02/11/24 1442 02/11/24 1540  BP: 121/64 (!) 99/56 (!) 95/53 (!) 94/55  Pulse: (!) 109 93 85 88  Resp: 18 16 16 18   Temp: 98.2 F (36.8 C) 98.7 F (37.1 C) 98.4 F (36.9 C) 98.8 F (37.1 C)  TempSrc:    Oral  SpO2: 98% 97% 97% 97%  Weight:      Height:       No data found.   Intake/Output Summary (Last 24 hours) at 02/11/2024 1554 Last data filed at 02/11/2024 1500 Gross per 24 hour  Intake 480 ml  Output 3125 ml  Net -2645 ml   Filed Weights   02/08/24 1743  Weight: 87.5 kg    Exam:  GEN: NAD SKIN: Warm and dry EYES: No pallor or icterus ENT: MMM CV: RRR PULM: CTA B ABD: soft, ND, NT, +BS CNS: AAO x 2 (person and place), non focal EXT: No edema or tenderness        Data Reviewed:   I have personally reviewed following labs and imaging studies:  Labs: Labs show the following:   Basic Metabolic Panel: Recent Labs  Lab 02/08/24 1739 02/08/24 2215 02/09/24 0500 02/10/24 0456 02/11/24 0425  NA 139  --  139 136 137  K 3.2*  --  3.4* 3.6 3.3*  CL 106  --  108 109 113*  CO2 18*  --  22 20* 19*  GLUCOSE 186*  --  98 95 94  BUN 5*  --  <5* 7 6  CREATININE 0.73  --  0.56* 0.63 0.65  CALCIUM 8.9  --  8.5* 7.7* 7.2*  MG  --   1.5* 2.4 1.6* 1.7  PHOS  --   --  3.5 1.3* 1.8*   GFR Estimated Creatinine Clearance: 111.1 mL/min (by C-G formula based on SCr of 0.65 mg/dL). Liver Function Tests: Recent Labs  Lab 02/08/24 1739 02/10/24 0456 02/11/24 0425  AST 81* 67* 65*  ALT 13 13 13   ALKPHOS 254* 197* 190*  BILITOT 3.6* 6.1* 4.5*  PROT 7.7 6.7 6.3*  ALBUMIN 2.9* 2.5* 2.3*   No results for input(s): "LIPASE", "AMYLASE" in the last 168 hours. Recent Labs  Lab 02/11/24 0425  AMMONIA 69*   Coagulation profile Recent Labs  Lab 02/08/24 1759 02/08/24 2222  INR 1.5* 1.4*    CBC: Recent Labs  Lab 02/08/24 1739 02/09/24 0500 02/09/24 1612 02/10/24 0456 02/10/24 1707 02/11/24 0425  WBC 5.4 5.5 5.2 5.6  --  3.7*  NEUTROABS 3.6  --   --   --   --   --   HGB 9.1* 8.0* 8.2* 7.7* 7.6* 7.3*  HCT 27.8* 25.3* 25.7* 23.2*  --  22.3*  MCV 88.3 90.0 89.5 87.5  --  90.3  PLT 105* 91* 85* 82*  --  57*   Cardiac Enzymes: No results for input(s): "CKTOTAL", "CKMB", "CKMBINDEX", "TROPONINI" in the last 168 hours. BNP (last 3 results) No results for input(s): "PROBNP" in the last 8760 hours. CBG: No results for input(s): "GLUCAP" in the last 168 hours. D-Dimer: No results for input(s): "DDIMER" in the last 72 hours. Hgb A1c: No results for input(s): "HGBA1C" in the last 72 hours. Lipid Profile: No results for input(s): "CHOL", "HDL", "LDLCALC", "TRIG", "CHOLHDL", "LDLDIRECT" in the last 72 hours. Thyroid function studies: Recent Labs    02/09/24 1612  TSH 0.567   Anemia work up: Recent Labs    02/09/24 0500 02/09/24 1612  VITAMINB12  --  1,302*  FERRITIN 27  --   TIBC 361  --   IRON 101  --    Sepsis Labs: Recent Labs  Lab 02/09/24 0500 02/09/24 1612 02/10/24 0456 02/11/24 0425  WBC 5.5 5.2 5.6 3.7*    Microbiology No results found for this or any previous visit (from the past 240 hours).  Procedures and diagnostic studies:  No results found.             LOS: 1 day    Rainey Rodger  [facility]   Pager on [email or web address]. If 7PM-7AM, please contact night-coverage at [email or web address]     02/11/2024, 3:54 PM

## 2024-02-11 NOTE — Progress Notes (Signed)
 Rounding Note    Patient Name: Ethan Wells Date of Encounter: 02/11/2024  [facility] Cardiologist: Verne Carrow, MD   Subjective   Patient seen on a.m. rounds.  Denies any chest pain or shortness of breath.  Hemoglobin 7.3 this morning.  Previously was on IV heparin infusion that had to be stopped due to drop in hemoglobin.  Reported episodes of confusion overnight. -2.2 L output in the last 24 hours.  Inpatient Medications    Scheduled Meds:  aspirin EC  81 mg Oral Daily   atorvastatin  80 mg Oral Daily   chlordiazePOXIDE  25 mg Oral QID   [START ON 02/12/2024] cosyntropin  0.25 mg Intravenous Once   folic acid  1 mg Oral Daily   furosemide  20 mg Oral Daily   LORazepam  0-4 mg Intravenous Q12H   metoprolol tartrate  12.5 mg Oral BID   multivitamin with minerals  1 tablet Oral Daily   nicotine  7 mg Transdermal Daily   pantoprazole (PROTONIX) IV  40 mg Intravenous Q12H   QUEtiapine  25 mg Oral QHS   sodium bicarbonate  650 mg Oral BID   thiamine  100 mg Oral Daily   Or   thiamine  100 mg Intravenous Daily   Continuous Infusions:  potassium PHOSPHATE IVPB (in mmol)     PRN Meds: acetaminophen, ALPRAZolam, LORazepam **OR** LORazepam, magnesium hydroxide, methocarbamol, morphine injection, ondansetron (ZOFRAN) IV, traZODone   Vital Signs    Vitals:   02/11/24 0430 02/11/24 0537 02/11/24 0831 02/11/24 1111  BP: (!) 106/59 129/65 121/64 (!) 99/56  Pulse: (!) 101 100 (!) 109 93  Resp: 18 18 18 16   Temp: 98.7 F (37.1 C) 98.5 F (36.9 C) 98.2 F (36.8 C) 98.7 F (37.1 C)  TempSrc: Oral Oral    SpO2: 98% 97% 98% 97%  Weight:      Height:        Intake/Output Summary (Last 24 hours) at 02/11/2024 1115 Last data filed at 02/11/2024 1016 Gross per 24 hour  Intake 240 ml  Output 2525 ml  Net -2285 ml      02/08/2024    5:43 PM  Last 3 Weights  Weight (lbs) 193 lb  Weight (kg) 87.544 kg      Telemetry    Sinus rhythm rates in the  80s occasional PVCs- Personally Reviewed  ECG    No new tracings completed- Personally Reviewed  Physical Exam   GEN: No acute distress.   Neck: No JVD Cardiac: RRR, no murmurs, rubs, or gallops.  Respiratory: Clear to auscultation bilaterally.  Respirations are unlabored at rest on room air GI: Soft, nontender, non-distended  MS: No edema; No deformity. Neuro:  Nonfocal  Psych: Normal affect   Labs    High Sensitivity Troponin:   Recent Labs  Lab 02/08/24 1739 02/08/24 2025 02/08/24 2315 02/09/24 0208  TROPONINIHS 19* 90* 159* 154*     Chemistry Recent Labs  Lab 02/08/24 1739 02/08/24 2215 02/09/24 0500 02/10/24 0456 02/11/24 0425  NA 139  --  139 136 137  K 3.2*  --  3.4* 3.6 3.3*  CL 106  --  108 109 113*  CO2 18*  --  22 20* 19*  GLUCOSE 186*  --  98 95 94  BUN 5*  --  <5* 7 6  CREATININE 0.73  --  0.56* 0.63 0.65  CALCIUM 8.9  --  8.5* 7.7* 7.2*  MG  --    < >  2.4 1.6* 1.7  PROT 7.7  --   --  6.7 6.3*  ALBUMIN 2.9*  --   --  2.5* 2.3*  AST 81*  --   --  67* 65*  ALT 13  --   --  13 13  ALKPHOS 254*  --   --  197* 190*  BILITOT 3.6*  --   --  6.1* 4.5*  GFRNONAA >60  --  >60 >60 >60  ANIONGAP 15  --  9 7 5    < > = values in this interval not displayed.    Lipids No results for input(s): "CHOL", "TRIG", "HDL", "LABVLDL", "LDLCALC", "CHOLHDL" in the last 168 hours.  Hematology Recent Labs  Lab 02/09/24 1612 02/10/24 0456 02/10/24 1707 02/11/24 0425  WBC 5.2 5.6  --  3.7*  RBC 2.87* 2.65*  --  2.47*  HGB 8.2* 7.7* 7.6* 7.3*  HCT 25.7* 23.2*  --  22.3*  MCV 89.5 87.5  --  90.3  MCH 28.6 29.1  --  29.6  MCHC 31.9 33.2  --  32.7  RDW 20.5* 20.9*  --  21.5*  PLT 85* 82*  --  57*   Thyroid  Recent Labs  Lab 02/09/24 1612  TSH 0.567    BNPNo results for input(s): "BNP", "PROBNP" in the last 168 hours.  DDimer No results for input(s): "DDIMER" in the last 168 hours.   Radiology    CT Angio Chest Pulmonary Embolism (PE) W or WO  Contrast Result Date: 02/09/2024 CLINICAL DATA:  Pulmonary embolism (PE) suspected, high prob. Substernal chest pain with nausea and vomiting. EXAM: CT ANGIOGRAPHY CHEST WITH CONTRAST TECHNIQUE: Multidetector CT imaging of the chest was performed using the standard protocol during bolus administration of intravenous contrast. Multiplanar CT image reconstructions and MIPs were obtained to evaluate the vascular anatomy. RADIATION DOSE REDUCTION: This exam was performed according to the departmental dose-optimization program which includes automated exposure control, adjustment of the mA and/or kV according to patient size and/or use of iterative reconstruction technique. CONTRAST:  75mL OMNIPAQUE IOHEXOL 350 MG/ML SOLN COMPARISON:  CT angiography chest from 01/03/2024. FINDINGS: Cardiovascular: No evidence of embolism to the proximal subsegmental pulmonary artery level. There is dilation of the main pulmonary trunk measuring up to 3.5 cm, which is nonspecific but can be seen with pulmonary artery hypertension. Normal cardiac size. No pericardial effusion. No aortic aneurysm. There are coronary artery calcifications, in keeping with coronary artery disease. There are also minimal peripheral atherosclerotic vascular calcifications of thoracic aorta and its major branches. Mediastinum/Nodes: Visualized thyroid gland appears grossly unremarkable. No solid / cystic mediastinal masses. The esophagus is nondistended precluding optimal assessment. No axillary, mediastinal or hilar lymphadenopathy by size criteria. Lungs/Pleura: The central tracheo-bronchial tree is patent. There are patchy areas of linear, plate-like atelectasis and/or scarring throughout bilateral lungs. No mass or consolidation. No pleural effusion or pneumothorax. There is a sub 3 mm solid noncalcified nodule in the subpleural left lung lower lobe (series 100, image 108), which is unchanged since the prior study. No new or suspicious lung nodules. Upper  Abdomen: There is a small sliding hiatal hernia. Tips noted in the central liver. Remaining visualized upper abdominal viscera within normal limits. Musculoskeletal: The visualized soft tissues of the chest wall are grossly unremarkable. No suspicious osseous lesions. There are mild multilevel degenerative changes in the visualized spine. Review of the MIP images confirms the above findings. IMPRESSION: 1. No embolism to the proximal subsegmental pulmonary artery level. 2. No lung mass,  consolidation, pleural effusion or pneumothorax. 3. Multiple other nonacute observations, as described above. Aortic Atherosclerosis (ICD10-I70.0). Electronically Signed   By: Jules Schick M.D.   On: 02/09/2024 16:48   ECHOCARDIOGRAM COMPLETE Result Date: 02/09/2024    ECHOCARDIOGRAM REPORT   Patient Name:   Ethan Wells Date of Exam: 02/09/2024 Medical Rec #:  [id number]         Height:       65.0 in Accession #:    [id number]        Weight:       193.0 lb Date of Birth:  22-Oct-1971         BSA:          1.948 m Patient Age:    52 years          BP:           89/48 mmHg Patient Gender: M                 HR:           74 bpm. Exam Location:  [facility] Procedure: 2D Echo, Cardiac Doppler, Color Doppler and Strain Analysis (Both            Spectral and Color Flow Doppler were utilized during procedure). Indications:     NSTEMI  History:         Patient has no prior history of Echocardiogram examinations.                  Arrythmias:Atrial Fibrillation, Signs/Symptoms:Chest Pain; Risk                  Factors:Dyslipidemia. NSTEMI.  Sonographer:     Mikki Harbor Referring Phys:  [id number] JAN A MANSY Diagnosing Phys: Marcina Millard MD IMPRESSIONS  1. Left ventricular ejection fraction, by estimation, is 55 to 60%. The left ventricle has normal function. The left ventricle has no regional wall motion abnormalities. Left ventricular diastolic parameters were normal. The global longitudinal strain is normal.  2. Right ventricular  systolic function is normal. The right ventricular size is normal. There is normal pulmonary artery systolic pressure.  3. The mitral valve is normal in structure. Mild mitral valve regurgitation. No evidence of mitral stenosis.  4. The aortic valve is normal in structure. Aortic valve regurgitation is not visualized. No aortic stenosis is present.  5. The inferior vena cava is normal in size with greater than 50% respiratory variability, suggesting right atrial pressure of 3 mmHg. FINDINGS  Left Ventricle: Left ventricular ejection fraction, by estimation, is 55 to 60%. The left ventricle has normal function. The left ventricle has no regional wall motion abnormalities. Strain was performed and the global longitudinal strain is normal. The  left ventricular internal cavity size was normal in size. There is no left ventricular hypertrophy. Left ventricular diastolic parameters were normal. Right Ventricle: The right ventricular size is normal. No increase in right ventricular wall thickness. Right ventricular systolic function is normal. There is normal pulmonary artery systolic pressure. The tricuspid regurgitant velocity is 2.52 m/s, and  with an assumed right atrial pressure of 3 mmHg, the estimated right ventricular systolic pressure is 28.4 mmHg. Left Atrium: Left atrial size was normal in size. Right Atrium: Right atrial size was normal in size. Pericardium: There is no evidence of pericardial effusion. Mitral Valve: The mitral valve is normal in structure. Mild mitral valve regurgitation. No evidence of mitral valve stenosis. MV peak gradient, 3.3 mmHg. The mean mitral valve gradient is  2.0 mmHg. Tricuspid Valve: The tricuspid valve is normal in structure. Tricuspid valve regurgitation is mild . No evidence of tricuspid stenosis. Aortic Valve: The aortic valve is normal in structure. Aortic valve regurgitation is not visualized. No aortic stenosis is present. Aortic valve mean gradient measures 4.0 mmHg.  Aortic valve peak gradient measures 8.1 mmHg. Aortic valve area, by VTI measures 2.57 cm. Pulmonic Valve: The pulmonic valve was normal in structure. Pulmonic valve regurgitation is not visualized. No evidence of pulmonic stenosis. Aorta: The aortic root is normal in size and structure. Venous: The inferior vena cava is normal in size with greater than 50% respiratory variability, suggesting right atrial pressure of 3 mmHg. IAS/Shunts: No atrial level shunt detected by color flow Doppler. Additional Comments: 3D imaging was not performed.  LEFT VENTRICLE PLAX 2D LVIDd:         5.20 cm      Diastology LVIDs:         3.80 cm      LV e' medial:    5.44 cm/s LV PW:         1.10 cm      LV E/e' medial:  14.8 LV IVS:        1.10 cm      LV e' lateral:   8.70 cm/s LVOT diam:     2.00 cm      LV E/e' lateral: 9.2 LV SV:         87 LV SV Index:   45 LVOT Area:     3.14 cm  LV Volumes (MOD) LV vol d, MOD A2C: 110.0 ml LV vol d, MOD A4C: 121.0 ml LV vol s, MOD A2C: 54.2 ml LV vol s, MOD A4C: 46.1 ml LV SV MOD A2C:     55.8 ml LV SV MOD A4C:     121.0 ml LV SV MOD BP:      67.9 ml RIGHT VENTRICLE RV Basal diam:  3.85 cm RV Mid diam:    3.40 cm RV S prime:     18.30 cm/s TAPSE (M-mode): 3.5 cm LEFT ATRIUM             Index        RIGHT ATRIUM           Index LA diam:        4.40 cm 2.26 cm/m   RA Area:     20.60 cm LA Vol (A2C):   60.0 ml 30.79 ml/m  RA Volume:   60.70 ml  31.15 ml/m LA Vol (A4C):   88.7 ml 45.52 ml/m LA Biplane Vol: 73.4 ml 37.67 ml/m  AORTIC VALVE                    PULMONIC VALVE AV Area (Vmax):    2.43 cm     PV Vmax:       1.15 m/s AV Area (Vmean):   2.27 cm     PV Peak grad:  5.3 mmHg AV Area (VTI):     2.57 cm AV Vmax:           142.00 cm/s AV Vmean:          99.800 cm/s AV VTI:            0.339 m AV Peak Grad:      8.1 mmHg AV Mean Grad:      4.0 mmHg LVOT Vmax:         110.00 cm/s LVOT Vmean:  72.000 cm/s LVOT VTI:          0.277 m LVOT/AV VTI ratio: 0.82  AORTA Ao Root diam: 3.60 cm  MITRAL VALVE               TRICUSPID VALVE MV Area (PHT): 3.51 cm    TR Peak grad:   25.4 mmHg MV Area VTI:   3.01 cm    TR Vmax:        252.00 cm/s MV Peak grad:  3.3 mmHg MV Mean grad:  2.0 mmHg    SHUNTS MV Vmax:       0.91 m/s    Systemic VTI:  0.28 m MV Vmean:      61.3 cm/s   Systemic Diam: 2.00 cm MV Decel Time: 216 msec MV E velocity: 80.30 cm/s MV A velocity: 74.80 cm/s MV E/A ratio:  1.07 Marcina Millard MD Electronically signed by Marcina Millard MD Signature Date/Time: 02/09/2024/1:29:32 PM    Final     Cardiac Studies  TTE 02/09/2024  1. Left ventricular ejection fraction, by estimation, is 55 to 60%. The  left ventricle has normal function. The left ventricle has no regional  wall motion abnormalities. Left ventricular diastolic parameters were  normal. The global longitudinal strain  is normal.   2. Right ventricular systolic function is normal. The right ventricular  size is normal. There is normal pulmonary artery systolic pressure.   3. The mitral valve is normal in structure. Mild mitral valve  regurgitation. No evidence of mitral stenosis.   4. The aortic valve is normal in structure. Aortic valve regurgitation is  not visualized. No aortic stenosis is present.   5. The inferior vena cava is normal in size with greater than 50%  respiratory variability, suggesting right atrial pressure of 3 mmHg.   Patient Profile     53 y.o. male with past medical history of coronary artery disease with prior STEMI in VF arrest in 01/2011 with LAD stenting, NSTEMI in 10/2022, ischemic cardiomyopathy, cirrhosis status post TIPS, polysubstance abuse (alcohol tobacco and cocaine use), recurrent GI bleed, mesenteric disease status post SMA stenting, paroxysmal atrial fibrillation, GERD, peripheral neuropathy, dyslipidemia, depression, who is being seen and evaluated for an NSTEMI.  Assessment & Plan    NSTEMI/ CAD with prior stenting -Presented with chest pain that occurred while  sleeping, described as burning sensation, worse with exertion -High-sensitivity troponin peaked at 159 -Prior left heart catheterization 10/2022 showed severe two-vessel CAD with CTO to the mid left circumflex and CTO of the mid RCA, LAD stent patent, collaterals are present and no intervention was recommended -Echocardiogram completed 12/28 showed LVEF of 50-25% with no RWMA -EKG nonacute with no significant changes -Originally was started on IV heparin infusion with drop in hemoglobin from 9.1-7.7 IV heparin was placed on hold -Unable to tolerate Imdur in the past due to hypotension was continued on metoprolol tartrate 12.5 mg twice daily -Continue on atorvastatin 80 mg daily -Aspirin 1 mg daily, if continued to have drop in hemoglobin may need to discontinue -Repeat echocardiogram this hospitalization revealed LVEF 55 to 60%, no RWMA, mild MR -Unfortunately is not a good cath candidate at this time due to anemia and thrombocytopenia, suspect demand ischemia in the setting of alcohol use and anemia with underlying CAD  Chronic hypotension -Blood pressure 99/56 -Previously had required midodrine in the past -Continued on metoprolol tartrate and furosemide -Blood pressure slightly soft -Vital signs per unit protocol  Chronic HFpEF -LVEF of 55 to 60%,  no RWMA, mild MR -Appears to be euvolemic on exam -Continue furosemide 20 mg daily and metoprolol to tartrate 12.5 mg twice daily consider transition to Toprol-XL prior to discharge - -2.2 liters output in the last 24 hours -Heart failure education -Daily weights, I's and O's, low-sodium diet  Anemia -Hemoglobin this morning 7.3 -Patient reported BRBPR with dark stools, most recent was 3 days ago -Originally was on heparin infusion that had to be stopped due to drop in hemoglobin -Recommend transfusing to keep hemoglobin 8 or greater -Daily CBC  Hypokalemia -Serum potassium 3.3 -Supplementation ordered -Recommend keeping potassium  closer to 4 less than 5 -Monitor/trend/replete electrolytes as needed -Daily BMP  Polysubstance abuse -Denies recent cocaine use -UDS negative -CIWA per IM -Continued on nicotine patches -Total cessation is recommended  GERD -Continue PPI therapy     For questions or updates, please contact [facility] Please consult [email or web address] for contact info under        Signed, Nysa Sarin, NP  02/11/2024, 11:15 AM

## 2024-02-11 NOTE — Plan of Care (Signed)

## 2024-02-11 NOTE — Plan of Care (Signed)
  Problem: Activity: Goal: Ability to tolerate increased activity will improve Outcome: Progressing   Problem: Cardiac: Goal: Ability to achieve and maintain adequate cardiovascular perfusion will improve Outcome: Progressing   Problem: Health Behavior/Discharge Planning: Goal: Ability to safely manage health-related needs after discharge will improve Outcome: Progressing   

## 2024-02-12 ENCOUNTER — Encounter: Payer: Self-pay | Admitting: Internal Medicine

## 2024-02-12 DIAGNOSIS — R079 Chest pain, unspecified: Secondary | ICD-10-CM

## 2024-02-12 DIAGNOSIS — K7031 Alcoholic cirrhosis of liver with ascites: Secondary | ICD-10-CM | POA: Diagnosis not present

## 2024-02-12 DIAGNOSIS — F10920 Alcohol use, unspecified with intoxication, uncomplicated: Secondary | ICD-10-CM | POA: Diagnosis not present

## 2024-02-12 DIAGNOSIS — F1411 Cocaine abuse, in remission: Secondary | ICD-10-CM | POA: Diagnosis not present

## 2024-02-12 DIAGNOSIS — I214 Non-ST elevation (NSTEMI) myocardial infarction: Secondary | ICD-10-CM | POA: Diagnosis not present

## 2024-02-12 LAB — CBC WITH DIFFERENTIAL/PLATELET
Abs Immature Granulocytes: 0.04 10*3/uL (ref 0.00–0.07)
Basophils Absolute: 0.1 10*3/uL (ref 0.0–0.1)
Basophils Relative: 1 %
Eosinophils Absolute: 0.1 10*3/uL (ref 0.0–0.5)
Eosinophils Relative: 1 %
HCT: 28.5 % — ABNORMAL LOW (ref 39.0–52.0)
Hemoglobin: 9.2 g/dL — ABNORMAL LOW (ref 13.0–17.0)
Immature Granulocytes: 1 %
Lymphocytes Relative: 11 %
Lymphs Abs: 0.7 10*3/uL (ref 0.7–4.0)
MCH: 28.8 pg (ref 26.0–34.0)
MCHC: 32.3 g/dL (ref 30.0–36.0)
MCV: 89.1 fL (ref 80.0–100.0)
Monocytes Absolute: 0.7 10*3/uL (ref 0.1–1.0)
Monocytes Relative: 11 %
Neutro Abs: 4.9 10*3/uL (ref 1.7–7.7)
Neutrophils Relative %: 75 %
Platelets: 89 10*3/uL — ABNORMAL LOW (ref 150–400)
RBC: 3.2 MIL/uL — ABNORMAL LOW (ref 4.22–5.81)
RDW: 21.5 % — ABNORMAL HIGH (ref 11.5–15.5)
WBC: 6.5 10*3/uL (ref 4.0–10.5)
nRBC: 0 % (ref 0.0–0.2)

## 2024-02-12 LAB — BPAM RBC
Blood Product Expiration Date: 202503282359
ISSUE DATE / TIME: 202502261818
Unit Type and Rh: 6200

## 2024-02-12 LAB — TYPE AND SCREEN
ABO/RH(D): A POS
Antibody Screen: NEGATIVE
Unit division: 0

## 2024-02-12 LAB — BASIC METABOLIC PANEL
Anion gap: 6 (ref 5–15)
BUN: <5 mg/dL — ABNORMAL LOW (ref 6–20)
CO2: 18 mmol/L — ABNORMAL LOW (ref 22–32)
Calcium: 7.7 mg/dL — ABNORMAL LOW (ref 8.9–10.3)
Chloride: 106 mmol/L (ref 98–111)
Creatinine, Ser: 0.77 mg/dL (ref 0.61–1.24)
GFR, Estimated: >60 mL/min (ref >60)
Glucose, Bld: 97 mg/dL (ref 70–99)
Potassium: 3.3 mmol/L — ABNORMAL LOW (ref 3.5–5.1)
Sodium: 133 mmol/L — ABNORMAL LOW (ref 135–145)

## 2024-02-12 LAB — MAGNESIUM: Magnesium: 2.1 mg/dL (ref 1.7–2.4)

## 2024-02-12 LAB — ACTH STIMULATION, 3 TIME POINTS
Cortisol, 30 Min: 14.4 ug/dL
Cortisol, 60 Min: 18.1 ug/dL
Cortisol, Base: 2.8 ug/dL

## 2024-02-12 LAB — HEMOGLOBIN: Hemoglobin: 8.7 g/dL — ABNORMAL LOW (ref 13.0–17.0)

## 2024-02-12 LAB — PHOSPHORUS: Phosphorus: 1.7 mg/dL — ABNORMAL LOW (ref 2.5–4.6)

## 2024-02-12 MED ORDER — POTASSIUM PHOSPHATES 15 MMOLE/5ML IV SOLN
45.0000 mmol | Freq: Once | INTRAVENOUS | Status: AC
  Administered 2024-02-12: 45 mmol via INTRAVENOUS
  Filled 2024-02-12: qty 15

## 2024-02-12 NOTE — Progress Notes (Signed)
 Patient is marked for merge. Unable to scan medications in. Called medical records to have them merge patients chart. Medical records closed for the evening. Informed unit clerk and charge nurse. Charge nurse second verifier of administered medications at this time.

## 2024-02-12 NOTE — Evaluation (Signed)
 Occupational Therapy Evaluation Patient Details Name: Ethan Wells MRN: [id number] DOB: 10/12/1971 Today's Date: 02/12/2024   History of Present Illness   Pt is a 53 y.o. male presented to the ER with acute onset of substernal chest pain graded 10/10 in severity with associated nausea and vomiting without diaphoresis. He admitted to mild dyspnea and palpitations, occasional cough and wheezing. Admitted for management of non-stemi PMH for coronary artery disease status post PCI and stents, CHF, alcohol abuse, paroxysmal atrial fibrillation, GERD, peripheral neuropathy, dyslipidemia, and and depression.     Clinical Impressions Pt was seen for OT evaluation this date. PTA, pt reports living with his mom. He was MOD I with mobility using a SPC or rollator, reports being IND with ADLs and household chores. Admits to 2 falls in the last 6 months.  Pt presents to acute OT demonstrating impaired ADL performance and functional mobility 2/2 weakness, balance deficits, AMS, pain and low activity tolerance. He reports 10/10 pain "all over", however did not grimace or complain during movement. HR stable. Pt currently requires SUP for bed mobility from long sitting position. Good seated balance. Limited safety awareness. STS from EOB with CGA with initial knee buckling, but improved with standing time. CGA for SPT to recliner using SPC, then pt agreeable to ambulate ~10-15 feet in the room using SPC with SBA and pushing IV pole at times before returning to bed. No LOB during mobility. Pt seemingly agitated that he may be going home tomorrow, although he appears close to his baseline.  Pt would benefit from skilled OT services to address noted impairments and functional limitations (see below for any additional details) in order to maximize safety and independence while minimizing falls risk and caregiver burden. Do anticipate the need for follow up OT services upon acute hospital DC.      If plan is  discharge home, recommend the following:   A little help with walking and/or transfers;A little help with bathing/dressing/bathroom;Assistance with cooking/housework;Assist for transportation;Help with stairs or ramp for entrance;Supervision due to cognitive status;Direct supervision/assist for financial management;Direct supervision/assist for medications management     Functional Status Assessment   Patient has had a recent decline in their functional status and demonstrates the ability to make significant improvements in function in a reasonable and predictable amount of time.     Equipment Recommendations   BSC/3in1     Recommendations for Other Services         Precautions/Restrictions   Precautions Precautions: Fall Restrictions Weight Bearing Restrictions Per Provider Order: No     Mobility Bed Mobility Overal bed mobility: Needs Assistance Bed Mobility: Supine to Sit, Sit to Supine     Supine to sit: Supervision Sit to supine: Supervision   General bed mobility comments: pt in long sitting on entry and returned to long sitting at end of session able to get in/out of bed with SUP    Transfers Overall transfer level: Needs assistance Equipment used: Straight cane Transfers: Sit to/from Stand, Bed to chair/wheelchair/BSC Sit to Stand: Contact guard assist     Step pivot transfers: Contact guard assist     General transfer comment: CGA for STS from EOB with knee buckling initially that improved, CGA for SPT to recliner, then able to ambulate to the door and back using SPC with CGA      Balance Overall balance assessment: Needs assistance Sitting-balance support: Feet supported Sitting balance-Leahy Scale: Good       Standing balance-Leahy Scale: Fair Standing balance comment: SPC  and CGA for mobility in room                           ADL either performed or assessed with clinical judgement   ADL Overall ADL's : Needs  assistance/impaired                         Toilet Transfer: Contact guard assist Toilet Transfer Details (indicate cue type and reason): simulated to recliner using SPC           General ADL Comments: pt appears close to baseline with ADLs-likely CGA/SUP     Vision         Perception         Praxis         Pertinent Vitals/Pain Pain Assessment Pain Assessment: 0-10 Pain Score: 10-Worst pain ever Pain Location: "all over" Pain Intervention(s): Monitored during session     Extremity/Trunk Assessment Upper Extremity Assessment Upper Extremity Assessment: Overall WFL for tasks assessed   Lower Extremity Assessment Lower Extremity Assessment: Generalized weakness       Communication Communication Communication: No apparent difficulties   Cognition Arousal: Alert Behavior During Therapy: WFL for tasks assessed/performed               OT - Cognition Comments: oriented to hospital, February and year of 2025; unable to state town/city the hospital is in                 Following commands: Intact       Cueing  General Comments   Cueing Techniques: Verbal cues      Exercises Other Exercises Other Exercises: Edu on role of OT in acute setting.   Shoulder Instructions      Home Living Family/patient expects to be discharged to:: Private residence Living Arrangements: Parent;Other relatives (mother) Available Help at Discharge: Family;Available 24 hours/day (mother not physically able to assist pt) Type of Home: House Home Access: Level entry     Home Layout: One level     Bathroom Shower/Tub: Chief Strategy Officer: Standard     Home Equipment: Educational psychologist (4 wheels);Cane - single point          Prior Functioning/Environment Prior Level of Function : Independent/Modified Independent;Driving;History of Falls (last six months)             Mobility Comments: SPC or rollator for mobility- "depends  on how I feel"; 2 falls in the last 6 months ADLs Comments: IND    OT Problem List: Decreased strength;Impaired balance (sitting and/or standing);Decreased safety awareness   OT Treatment/Interventions: Self-care/ADL training;Therapeutic exercise;Therapeutic activities;DME and/or AE instruction;Patient/family education;Balance training      OT Goals(Current goals can be found in the care plan section)   Acute Rehab OT Goals Patient Stated Goal: improve pain OT Goal Formulation: With patient Time For Goal Achievement: 02/26/24 Potential to Achieve Goals: Good ADL Goals Pt Will Perform Lower Body Bathing: with modified independence;sit to/from stand;sitting/lateral leans Pt Will Perform Lower Body Dressing: with modified independence;sitting/lateral leans;sit to/from stand Pt Will Transfer to Toilet: with modified independence;regular height toilet;ambulating   OT Frequency:  Min 1X/week    Co-evaluation              AM-PAC OT "6 Clicks" Daily Activity     Outcome Measure Help from another person eating meals?: None Help from another person taking care of personal grooming?: None Help from  another person toileting, which includes using toliet, bedpan, or urinal?: A Little Help from another person bathing (including washing, rinsing, drying)?: A Little Help from another person to put on and taking off regular upper body clothing?: None Help from another person to put on and taking off regular lower body clothing?: A Little 6 Click Score: 21   End of Session Equipment Utilized During Treatment:  [facility]) Nurse Communication: Mobility status  Activity Tolerance: Patient tolerated treatment well Patient left: in bed;with call bell/phone within reach;with bed alarm set  OT Visit Diagnosis: Other abnormalities of gait and mobility (R26.89);Unsteadiness on feet (R26.81)                Time: 1610-9604 OT Time Calculation (min): 14 min Charges:  OT General Charges $OT Visit: 1  Visit OT Evaluation $OT Eval Moderate Complexity: 1 Mod Nyazia Canevari, OTR/L 02/12/24, 1:34 PM  Chantrell Apsey E Venisa Frampton 02/12/2024, 1:30 PM

## 2024-02-12 NOTE — Plan of Care (Signed)
  Problem: Cardiac: Goal: Ability to achieve and maintain adequate cardiovascular perfusion will improve Outcome: Progressing   Problem: Clinical Measurements: Goal: Respiratory complications will improve Outcome: Progressing   Problem: Clinical Measurements: Goal: Cardiovascular complication will be avoided Outcome: Progressing   Problem: Nutrition: Goal: Adequate nutrition will be maintained Outcome: Progressing

## 2024-02-12 NOTE — Progress Notes (Signed)
 Patient continues to be anxious and agitated throughout the evening. Patient continues to climb over the side rails. Redirect and reoriented patient throughout the evening.

## 2024-02-12 NOTE — Progress Notes (Signed)
 Patient resting in bed. Patient has one unit of RBC's running at 120 ml/hr. No S/S of distress, no S/S of allergic reactions.

## 2024-02-12 NOTE — TOC Progression Note (Signed)
 Transition of Care [facility]) - Progression Note    Patient Details  Name: Ethan Wells MRN: [id number] Date of Birth: 10/18/1971  [facility] CM/SW Contact  Truddie Hidden, RN Phone Number: 02/12/2024, 3:19 PM  Clinical Narrative:    TOC continuing to follow patient's progress throughout discharge planning.   Expected Discharge Plan: Home/Self Care Barriers to Discharge: Continued Medical Work up  Expected Discharge Plan and Services     Post Acute Care Choice: NA Living arrangements for the past 2 months: Single Family Home                                       Social Determinants of Health (SDOH) Interventions SDOH Screenings   Food Insecurity: No Food Insecurity (02/09/2024)  Housing: Low Risk  (02/09/2024)  Transportation Needs: No Transportation Needs (02/09/2024)  Utilities: Not At Risk (02/09/2024)  Social Connections: Unknown (02/09/2024)  Tobacco Use: High Risk (02/08/2024)    Readmission Risk Interventions     No data to display

## 2024-02-12 NOTE — Evaluation (Signed)
 Physical Therapy Evaluation Patient Details Name: Ethan Wells MRN: [id number] DOB: August 10, 1971 Today's Date: 02/12/2024  History of Present Illness  Pt is a 53 y.o. male presented to the ER with acute onset of substernal chest pain graded 10/10 in severity with associated nausea and vomiting without diaphoresis. He admitted to mild dyspnea and palpitations, occasional cough and wheezing. Admitted for management of non-stemi PMH for coronary artery disease status post PCI and stents, CHF, alcohol abuse, paroxysmal atrial fibrillation, GERD, peripheral neuropathy, dyslipidemia, and and depression.  Clinical Impression  Pt initially refusing PT stating he is "Too sore from laying in bed," when the logic of not getting up due to this was made clear to him he further refused saying he just wanted to rest.  Pt's lunch was on tray untouched and getting up to recliner to eat appealed to him enough that he was willing to do some limited mobility get up sitting and to eat.  He was able to move relatively well in the bed, he did not show a lot of strength or steadiness with the few steps from bed to recliner but reported feeling close to his normal with this and did not have any LOBs or overt safety issues.  Pt reports no interest in PT at home and similarly indicated that he just wanted to be left alone while here in the hospital.  Pt generally unsteady and would benefit from continued PT to address these safety issues.      If plan is discharge home, recommend the following:     Can travel by private vehicle        Equipment Recommendations None recommended by PT  Recommendations for Other Services       Functional Status Assessment Patient has had a recent decline in their functional status and demonstrates the ability to make significant improvements in function in a reasonable and predictable amount of time.     Precautions / Restrictions Precautions Precautions: Fall Restrictions Weight  Bearing Restrictions Per Provider Order: No      Mobility  Bed Mobility Overal bed mobility: Modified Independent Bed Mobility: Supine to Sit, Sit to Supine     Supine to sit: Supervision Sit to supine: Supervision   General bed mobility comments: initially hesitant to do any moving    Transfers Overall transfer level: Modified independent Equipment used: None (refuses cane when PT handed it to him) Transfers: Sit to/from Stand, Bed to chair/wheelchair/BSC Sit to Stand: Supervision           General transfer comment: pt lacked a lot of power to get to standing but once up showed come confidence and did not have any LOBs.    Ambulation/Gait Ambulation/Gait assistance: Supervision Gait Distance (Feet): 5 Feet Assistive device: None         General Gait Details: Managed some ridgid shuffling steps a few feet from bed to recliner.  No LOBs or overt safety issues, but showed neither good awareness or interest in listening to cues/etc from PT  Stairs            Wheelchair Mobility     Tilt Bed    Modified Rankin (Stroke Patients Only)       Balance Overall balance assessment: Needs assistance Sitting-balance support: Feet supported Sitting balance-Leahy Scale: Good       Standing balance-Leahy Scale: Fair Standing balance comment: wide BOS, limited knee ROM but able to maintain static and transfer standing balance w/o UE support  Pertinent Vitals/Pain Pain Assessment Pain Assessment:  ("Sore from laying in bed, sore all the time")    Home Living Family/patient expects to be discharged to:: Private residence Living Arrangements: Parent;Other relatives Available Help at Discharge: Family;Available 24 hours/day (he reports he is the caregiver, runs all the errands) Type of Home: House Home Access: Level entry       Home Layout: One level        Prior Function Prior Level of Function :  Independent/Modified Independent;Driving;History of Falls (last six months)             Mobility Comments: SPC or rollator for mobility- "depends on how I feel"; a few falls in the last 6 months ADLs Comments: IND     Extremity/Trunk Assessment   Upper Extremity Assessment Upper Extremity Assessment: Overall WFL for tasks assessed;Generalized weakness    Lower Extremity Assessment Lower Extremity Assessment: Overall WFL for tasks assessed;Generalized weakness       Communication   Communication Communication: No apparent difficulties    Cognition Arousal: Lethargic Behavior During Therapy: Restless                           PT - Cognition Comments: Pt resistant to doing much at all with PT but did provide terse, distant comments to nearly all PT comments/questions Following commands: Intact (on his terms only)       Cueing Cueing Techniques: Verbal cues     General Comments General comments (skin integrity, edema, etc.): Pt wanting to sleep, only willing to get up to get his lunch    Exercises     Assessment/Plan    PT Assessment Patient needs continued PT services  PT Problem List Decreased strength;Decreased activity tolerance;Decreased mobility;Decreased balance;Decreased safety awareness;Pain       PT Treatment Interventions DME instruction;Gait training;Functional mobility training;Therapeutic exercise;Therapeutic activities;Balance training;Patient/family education    PT Goals (Current goals can be found in the Care Plan section)  Acute Rehab PT Goals Patient Stated Goal: go home PT Goal Formulation: With patient Time For Goal Achievement: 02/25/24 Potential to Achieve Goals: Fair    Frequency Min 1X/week     Co-evaluation               AM-PAC PT "6 Clicks" Mobility  Outcome Measure Help needed turning from your back to your side while in a flat bed without using bedrails?: Total Help needed moving from lying on your back to  sitting on the side of a flat bed without using bedrails?: Total Help needed moving to and from a bed to a chair (including a wheelchair)?: Total Help needed standing up from a chair using your arms (e.g., wheelchair or bedside chair)?: Total Help needed to walk in hospital room?: Total Help needed climbing 3-5 steps with a railing? : Total 6 Click Score: 6    End of Session   Activity Tolerance: Treatment limited secondary to agitation Patient left: with chair alarm set;with call bell/phone within reach Nurse Communication: Mobility status PT Visit Diagnosis: Muscle weakness (generalized) (M62.81);Unsteadiness on feet (R26.81);Difficulty in walking, not elsewhere classified (R26.2)    Time: 1412-1430 PT Time Calculation (min) (ACUTE ONLY): 18 min   Charges:   PT Evaluation $PT Eval Low Complexity: 1 Low   PT General Charges $$ ACUTE PT VISIT: 1 Visit         Malachi Pro, DPT 02/12/2024, 3:19 PM

## 2024-02-12 NOTE — Progress Notes (Signed)
 Rounding Note    Patient Name: Ethan Wells Date of Encounter: 02/12/2024  [facility] Cardiologist: Ethan Carrow, MD   Subjective   Patient seen on a.m. rounds.  Sitting up in the recliner.  Denies any chest pain or shortness of breath.  Blood pressures have been soft overnight.  -2.6 L output in the last 24 hours.  Inpatient Medications    Scheduled Meds:  sodium chloride   Intravenous Once   aspirin EC  81 mg Oral Daily   atorvastatin  80 mg Oral Daily   chlordiazePOXIDE  25 mg Oral QID   folic acid  1 mg Oral Daily   furosemide  20 mg Oral Daily   LORazepam  0-4 mg Intravenous Q12H   metoprolol succinate  12.5 mg Oral Daily   multivitamin with minerals  1 tablet Oral Daily   nicotine  7 mg Transdermal Daily   pantoprazole (PROTONIX) IV  40 mg Intravenous Q12H   QUEtiapine  25 mg Oral QHS   sodium bicarbonate  650 mg Oral BID   thiamine  100 mg Oral Daily   Or   thiamine  100 mg Intravenous Daily   Continuous Infusions:  PRN Meds: acetaminophen, ALPRAZolam, magnesium hydroxide, methocarbamol, morphine injection, ondansetron (ZOFRAN) IV, traZODone   Vital Signs    Vitals:   02/11/24 2305 02/12/24 0200 02/12/24 0331 02/12/24 0339  BP: (!) 91/52 103/70 (!) 108/48 99/60  Pulse: 79 80 96 95  Resp: 18  16 18   Temp: 97.8 F (36.6 C)  98.1 F (36.7 C) 98 F (36.7 C)  TempSrc: Oral  Oral   SpO2: 97%  97% 98%  Weight:      Height:        Intake/Output Summary (Last 24 hours) at 02/12/2024 0744 Last data filed at 02/12/2024 0300 Gross per 24 hour  Intake 572 ml  Output 3200 ml  Net -2628 ml      02/08/2024    5:43 PM  Last 3 Weights  Weight (lbs) 193 lb  Weight (kg) 87.544 kg      Telemetry    Sinus rhythm rates of 80-90 with large quantity of artifact- Personally Reviewed  ECG    No new tracings- Personally Reviewed  Physical Exam   GEN: No acute distress.   Neck: No JVD Cardiac: RRR, no murmurs, rubs, or gallops.   Respiratory: Clear to auscultation bilaterally.  Respirations are unlabored at rest on room air GI: Soft, nontender, non-distended  MS: No edema; No deformity. Neuro:  Nonfocal  Psych: Normal affect   Labs    High Sensitivity Troponin:   Recent Labs  Lab 02/08/24 1739 02/08/24 2025 02/08/24 2315 02/09/24 0208  TROPONINIHS 19* 90* 159* 154*     Chemistry Recent Labs  Lab 02/08/24 1739 02/08/24 2215 02/10/24 0456 02/11/24 0425 02/12/24 0437  NA 139   < > 136 137 133*  K 3.2*   < > 3.6 3.3* 3.3*  CL 106   < > 109 113* 106  CO2 18*   < > 20* 19* 18*  GLUCOSE 186*   < > 95 94 97  BUN 5*   < > 7 6 <5*  CREATININE 0.73   < > 0.63 0.65 0.77  CALCIUM 8.9   < > 7.7* 7.2* 7.7*  MG  --    < > 1.6* 1.7 2.1  PROT 7.7  --  6.7 6.3*  --   ALBUMIN 2.9*  --  2.5* 2.3*  --  AST 81*  --  67* 65*  --   ALT 13  --  13 13  --   ALKPHOS 254*  --  197* 190*  --   BILITOT 3.6*  --  6.1* 4.5*  --   GFRNONAA >60   < > >60 >60 >60  ANIONGAP 15   < > 7 5 6    < > = values in this interval not displayed.    Lipids No results for input(s): "CHOL", "TRIG", "HDL", "LABVLDL", "LDLCALC", "CHOLHDL" in the last 168 hours.  Hematology Recent Labs  Lab 02/10/24 0456 02/10/24 1707 02/11/24 0425 02/11/24 1706 02/11/24 2308 02/12/24 0437  WBC 5.6  --  3.7*  --   --  6.5  RBC 2.65*  --  2.47*  --   --  3.20*  HGB 7.7*   < > 7.3* 7.9* 9.1* 9.2*  HCT 23.2*  --  22.3*  --  28.4* 28.5*  MCV 87.5  --  90.3  --   --  89.1  MCH 29.1  --  29.6  --   --  28.8  MCHC 33.2  --  32.7  --   --  32.3  RDW 20.9*  --  21.5*  --   --  21.5*  PLT 82*  --  57*  --   --  89*   < > = values in this interval not displayed.   Thyroid  Recent Labs  Lab 02/09/24 1612  TSH 0.567    BNPNo results for input(s): "BNP", "PROBNP" in the last 168 hours.  DDimer No results for input(s): "DDIMER" in the last 168 hours.   Radiology    No results found.  Cardiac Studies   TTE 02/09/2024  1. Left ventricular  ejection fraction, by estimation, is 55 to 60%. The  left ventricle has normal function. The left ventricle has no regional  wall motion abnormalities. Left ventricular diastolic parameters were  normal. The global longitudinal strain  is normal.   2. Right ventricular systolic function is normal. The right ventricular  size is normal. There is normal pulmonary artery systolic pressure.   3. The mitral valve is normal in structure. Mild mitral valve  regurgitation. No evidence of mitral stenosis.   4. The aortic valve is normal in structure. Aortic valve regurgitation is  not visualized. No aortic stenosis is present.   5. The inferior vena cava is normal in size with greater than 50%  respiratory variability, suggesting right atrial pressure of 3 mmHg.   Patient Profile     53 y.o. male with a past medical history of coronary artery disease with prior STEMI and VF arrest in 01/2011 with LAD stenting, NSTEMI in 10/2022, ischemic cardiomyopathy, cirrhosis status post TIPS, polysubstance abuse (alcohol, tobacco, cocaine use), recurrent GI bleed, mesenteric disease status post SMA stenting, paroxysmal atrial fibrillation, GERD, peripheral neuropathy, dyslipidemia, depression, is being seen and evaluated for NSTEMI.  Assessment & Plan    NSTEMI/CAD with prior stenting -Presented with chest pain that occurred while sleeping, described as a burning sensation that was worse with exertion -High-sensitivity troponins peaked at 159 -Prior left heart catheterization 10/2022 showed severe two-vessel CAD with CTO to the mid left circumflex and CTO of the mid RCA, LAD stent was patent, collaterals were present and no intervention was recommended -Echocardiogram revealed an LVEF of 50-55% with no RWMA -EKG was nonacute with no significant changes -Originally was started on heparin infusion but with drop in hemoglobin heparin was placed  on hold -He was unable to tolerate Imdur in the past due to  hypotension is continued on metoprolol tartrate -Continued on atorvastatin 80 mg daily -Continued on aspirin 81 mg daily -Repeat echocardiogram during hospitalization revealed LVEF 55 to 60%, no RWMA, mild MR -Unfortunately he is not considered a good cath candidate at this time due to his anemia and thrombocytopenia, suspect demand ischemia in the setting of alcohol use and anemia with underlying CAD  Chronic hypertension -Blood pressure -Previously had required midodrine in the past -Continued on metoprolol and furosemide -Vital signs per unit protocol  Chronic HFpEF -LVEF 55 to 60%, no RWMA, mild MR, -Continues to appear euvolemic on exam -Continued on furosemide and metoprolol --2.6 L output in the last 24 hours -Not ideal candidate for MRA or SGLT2 therapy due to chronic hypotension -Continue with heart failure education -Daily weights, I's and O's, low-sodium diet  Anemia -Hemoglobin 9.2 -No further reports of BRBPR -Received 1 unit of blood 02/11/2024 -Recommend transfuse to keep hemoglobin 8 or greater -Daily CBC  Hypokalemia -Serum potassium 3.3 -Continued on potassium supplements -Recommend keeping potassium closer to 4 less than 5 -Monitor/trend/replete electrolytes as needed -Daily BMP  Polysubstance abuse -Denies recent cocaine use -Negative UDS -Continued on nicotine patches -Continued on CIWA protocol -Total cessation is recommended  GERD -Continue PPI therapy     For questions or updates, please contact [facility] Please consult [email or web address] for contact info under        Signed, Olander Friedl, NP  02/12/2024, 7:44 AM

## 2024-02-12 NOTE — Progress Notes (Addendum)
 Progress Note    Ethan Wells  ZOX:[id number] DOB: 07-07-1971  DOA: 02/08/2024 PCP: Pcp, No      Brief Narrative:    Medical records reviewed and are as summarized below:   Ethan Wells is a 53 y.o. male with medical history significant for coronary artery disease status post PCI and stents, CHF, alcohol abuse, paroxysmal atrial fibrillation, GERD, peripheral neuropathy, dyslipidemia, and and depression, presented to the emergency room with acute onset of substernal chest pain graded 10/10 in severity with associated nausea and vomiting without diaphoresis.  Ethan Wells admitted to mild dyspnea and palpitations.  Upon arriving the hospital, his alcohol level was 342, troponin 159 peak, patient also developed hypotension, was given IV fluid bolus. Patient became agitated and confused the night of 2/24, treated for alcohol withdrawal.       Assessment/Plan:   Principal Problem:   NSTEMI (non-ST elevated myocardial infarction) (HCC) Active Problems:   Dyslipidemia   GERD without esophagitis   Hypokalemia   Depression   Pancytopenia (HCC)   Hypomagnesemia   PAF (paroxysmal atrial fibrillation) (HCC)   Class 1 obesity   Alcoholic cirrhosis of liver with ascites (HCC)   Alcoholic intoxication without complication (HCC)   Confusion   History of cocaine abuse (HCC)   Blood loss anemia    Body mass index is 32.12 kg/m.  (Obesity)   NSTEMI (non-ST elevated myocardial infarction) (HCC) History of coronary disease with stents. Troponins went from 19-159. Continue low-dose aspirin and high-dose Lipitor Ethan Wells was previously treated with IV heparin drip. Cardiologist recommended conservative management because of alcohol use disorder. 2D echo showed EF estimated at 55 to 60%, if normal LV diastolic parameters, mild MR   Paroxysmal atrial fibrillation Continue metoprolol   Alcohol use disorder with alcohol withdrawal. Acute toxic metabolic encephalopathy with recent  agitation Mental status is better Continue Librium.  Use Ativan as needed per CIWA protocol.   Pancytopenia secondary to liver disease. Leukopenia has resolved.   H&H improved s/p transfusion with 1 unit of PRBCs on 02/11/2024. Hemoglobin had gone down to 7.3.  Hemoglobin was 9.1 on admission. Platelet count is improving. Pancytopenia likely from alcohol use and liver disease. Ethan Wells also has anemia of chronic disease Ammonia level was 69 but doubt hepatic encephalopathy at this time    Hypokalemia. Metabolic acidosis. Hypomagnesemia. Hypophosphatemia. Continue potassium and phosphorus repletion with IV potassium phosphate and oral potassium chloride. Magnesium level is improved   Hypotension. BP stable.  S/p treatment with IV fluids. Cortisol level 6.5.  ACTH stimulation was ordered. On 02/12/2024, base cortisol level was 2.8, cortisol level after 30 minutes was 14.4 and cortisol level after 60 minutes was 18.1. Thus, adrenal insufficiency has been ruled out.   General weakness Consult PT and OT   Comorbidities include depression, dyslipidemia, GERD    Diet Order             DIET DYS 3 Room service appropriate? Yes; Fluid consistency: Thin  Diet effective now                            Consultants: Cardiologist  Procedures: None    Medications:    sodium chloride   Intravenous Once   aspirin EC  81 mg Oral Daily   atorvastatin  80 mg Oral Daily   chlordiazePOXIDE  25 mg Oral QID   folic acid  1 mg Oral Daily   furosemide  20 mg Oral Daily  LORazepam  0-4 mg Intravenous Q12H   metoprolol succinate  12.5 mg Oral Daily   multivitamin with minerals  1 tablet Oral Daily   nicotine  7 mg Transdermal Daily   pantoprazole (PROTONIX) IV  40 mg Intravenous Q12H   QUEtiapine  25 mg Oral QHS   sodium bicarbonate  650 mg Oral BID   thiamine  100 mg Oral Daily   Or   thiamine  100 mg Intravenous Daily   Continuous Infusions:  potassium PHOSPHATE  IVPB (in mmol) 45 mmol (02/12/24 1006)     Anti-infectives (From admission, onward)    None              Family Communication/Anticipated D/C date and plan/Code Status   DVT prophylaxis: Place and maintain sequential compression device Start: 02/11/24 1134     Code Status: Full Code  Family Communication: None Disposition Plan: Plan to discharge home   Status is: Inpatient Remains inpatient appropriate because: Alcohol withdrawal syndrome       Subjective:   Interval events noted.  Ethan Wells still has some chest pain and upper back pain.  No shortness of breath.  Overall, Ethan Wells feels better.     Objective:    Vitals:   02/12/24 0331 02/12/24 0339 02/12/24 0754 02/12/24 1211  BP: (!) 108/48 99/60 (!) 103/55 107/63  Pulse: 96 95 89 80  Resp: 16 18 18 20   Temp: 98.1 F (36.7 C) 98 F (36.7 C) 98.4 F (36.9 C) 97.7 F (36.5 C)  TempSrc: Oral   Oral  SpO2: 97% 98% 97% 97%  Weight:      Height:       No data found.   Intake/Output Summary (Last 24 hours) at 02/12/2024 1519 Last data filed at 02/12/2024 0800 Gross per 24 hour  Intake 332 ml  Output 1000 ml  Net -668 ml   Filed Weights   02/08/24 1743  Weight: 87.5 kg    Exam:  GEN: NAD, sitting up in the chair SKIN: Warm and dry EYES: No pallor or icterus ENT: MMM CV: RRR PULM: CTA B ABD: soft, ND, NT, +BS CNS: AAO x 3, non focal EXT: No edema or tenderness        Data Reviewed:   I have personally reviewed following labs and imaging studies:  Labs: Labs show the following:   Basic Metabolic Panel: Recent Labs  Lab 02/08/24 1739 02/08/24 2215 02/09/24 0500 02/10/24 0456 02/11/24 0425 02/12/24 0437  NA 139  --  139 136 137 133*  K 3.2*  --  3.4* 3.6 3.3* 3.3*  CL 106  --  108 109 113* 106  CO2 18*  --  22 20* 19* 18*  GLUCOSE 186*  --  98 95 94 97  BUN 5*  --  <5* 7 6 <5*  CREATININE 0.73  --  0.56* 0.63 0.65 0.77  CALCIUM 8.9  --  8.5* 7.7* 7.2* 7.7*  MG  --  1.5* 2.4  1.6* 1.7 2.1  PHOS  --   --  3.5 1.3* 1.8* 1.7*   GFR Estimated Creatinine Clearance: 111.1 mL/min (by C-G formula based on SCr of 0.77 mg/dL). Liver Function Tests: Recent Labs  Lab 02/08/24 1739 02/10/24 0456 02/11/24 0425  AST 81* 67* 65*  ALT 13 13 13   ALKPHOS 254* 197* 190*  BILITOT 3.6* 6.1* 4.5*  PROT 7.7 6.7 6.3*  ALBUMIN 2.9* 2.5* 2.3*   No results for input(s): "LIPASE", "AMYLASE" in the last 168 hours. Recent  Labs  Lab 02/11/24 0425  AMMONIA 69*   Coagulation profile Recent Labs  Lab 02/08/24 1759 02/08/24 2222  INR 1.5* 1.4*    CBC: Recent Labs  Lab 02/08/24 1739 02/09/24 0500 02/09/24 1612 02/10/24 0456 02/10/24 1707 02/11/24 0425 02/11/24 1706 02/11/24 2308 02/12/24 0437  WBC 5.4 5.5 5.2 5.6  --  3.7*  --   --  6.5  NEUTROABS 3.6  --   --   --   --   --   --   --  4.9  HGB 9.1* 8.0* 8.2* 7.7* 7.6* 7.3* 7.9* 9.1* 9.2*  HCT 27.8* 25.3* 25.7* 23.2*  --  22.3*  --  28.4* 28.5*  MCV 88.3 90.0 89.5 87.5  --  90.3  --   --  89.1  PLT 105* 91* 85* 82*  --  57*  --   --  89*   Cardiac Enzymes: No results for input(s): "CKTOTAL", "CKMB", "CKMBINDEX", "TROPONINI" in the last 168 hours. BNP (last 3 results) No results for input(s): "PROBNP" in the last 8760 hours. CBG: No results for input(s): "GLUCAP" in the last 168 hours. D-Dimer: No results for input(s): "DDIMER" in the last 72 hours. Hgb A1c: No results for input(s): "HGBA1C" in the last 72 hours. Lipid Profile: No results for input(s): "CHOL", "HDL", "LDLCALC", "TRIG", "CHOLHDL", "LDLDIRECT" in the last 72 hours. Thyroid function studies: Recent Labs    02/09/24 1612  TSH 0.567   Anemia work up: Recent Labs    02/09/24 1612  VITAMINB12 1,302*   Sepsis Labs: Recent Labs  Lab 02/09/24 1612 02/10/24 0456 02/11/24 0425 02/12/24 0437  WBC 5.2 5.6 3.7* 6.5    Microbiology No results found for this or any previous visit (from the past 240 hours).  Procedures and diagnostic  studies:  No results found.             LOS: 2 days   Terrace Chiem  [facility]   Pager on [email or web address]. If 7PM-7AM, please contact night-coverage at [email or web address]     02/12/2024, 3:19 PM

## 2024-02-13 ENCOUNTER — Encounter: Payer: Medicare HMO | Admitting: Adult Health

## 2024-02-13 ENCOUNTER — Telehealth: Payer: Self-pay

## 2024-02-13 ENCOUNTER — Other Ambulatory Visit: Payer: Self-pay | Admitting: Adult Health

## 2024-02-13 DIAGNOSIS — G894 Chronic pain syndrome: Secondary | ICD-10-CM

## 2024-02-13 DIAGNOSIS — I214 Non-ST elevation (NSTEMI) myocardial infarction: Secondary | ICD-10-CM | POA: Diagnosis not present

## 2024-02-13 LAB — CBC
HCT: 27.2 % — ABNORMAL LOW (ref 39.0–52.0)
Hemoglobin: 8.7 g/dL — ABNORMAL LOW (ref 13.0–17.0)
MCH: 28.7 pg (ref 26.0–34.0)
MCHC: 32 g/dL (ref 30.0–36.0)
MCV: 89.8 fL (ref 80.0–100.0)
Platelets: 60 10*3/uL — ABNORMAL LOW (ref 150–400)
RBC: 3.03 MIL/uL — ABNORMAL LOW (ref 4.22–5.81)
RDW: 22.3 % — ABNORMAL HIGH (ref 11.5–15.5)
WBC: 5.4 10*3/uL (ref 4.0–10.5)
nRBC: 0 % (ref 0.0–0.2)

## 2024-02-13 LAB — HEMOGLOBIN: Hemoglobin: 8.9 g/dL — ABNORMAL LOW (ref 13.0–17.0)

## 2024-02-13 LAB — BASIC METABOLIC PANEL
Anion gap: 9 (ref 5–15)
BUN: 6 mg/dL (ref 6–20)
CO2: 18 mmol/L — ABNORMAL LOW (ref 22–32)
Calcium: 8 mg/dL — ABNORMAL LOW (ref 8.9–10.3)
Chloride: 106 mmol/L (ref 98–111)
Creatinine, Ser: 0.86 mg/dL (ref 0.61–1.24)
GFR, Estimated: >60 mL/min (ref >60)
Glucose, Bld: 126 mg/dL — ABNORMAL HIGH (ref 70–99)
Potassium: 3.6 mmol/L (ref 3.5–5.1)
Sodium: 133 mmol/L — ABNORMAL LOW (ref 135–145)

## 2024-02-13 LAB — MAGNESIUM: Magnesium: 1.9 mg/dL (ref 1.7–2.4)

## 2024-02-13 LAB — PHOSPHORUS: Phosphorus: 2.4 mg/dL — ABNORMAL LOW (ref 2.5–4.6)

## 2024-02-13 MED ORDER — K PHOS MONO-SOD PHOS DI & MONO 155-852-130 MG PO TABS
500.0000 mg | ORAL_TABLET | Freq: Three times a day (TID) | ORAL | Status: AC
  Administered 2024-02-13 (×3): 500 mg via ORAL
  Filled 2024-02-13 (×3): qty 2

## 2024-02-13 MED ORDER — CHLORDIAZEPOXIDE HCL 25 MG PO CAPS
25.0000 mg | ORAL_CAPSULE | Freq: Three times a day (TID) | ORAL | Status: DC
  Administered 2024-02-13 – 2024-02-14 (×4): 25 mg via ORAL
  Filled 2024-02-13 (×4): qty 1

## 2024-02-13 MED ORDER — GABAPENTIN 100 MG PO CAPS
100.0000 mg | ORAL_CAPSULE | Freq: Two times a day (BID) | ORAL | Status: DC
  Administered 2024-02-13 – 2024-02-15 (×6): 100 mg via ORAL
  Filled 2024-02-13 (×6): qty 1

## 2024-02-13 MED ORDER — MIDODRINE HCL 5 MG PO TABS
5.0000 mg | ORAL_TABLET | Freq: Three times a day (TID) | ORAL | Status: DC
  Administered 2024-02-13 – 2024-02-15 (×8): 5 mg via ORAL
  Filled 2024-02-13 (×8): qty 1

## 2024-02-13 MED ORDER — SERTRALINE HCL 50 MG PO TABS
50.0000 mg | ORAL_TABLET | Freq: Every day | ORAL | Status: DC
  Administered 2024-02-13 – 2024-02-17 (×5): 50 mg via ORAL
  Filled 2024-02-13 (×5): qty 1

## 2024-02-13 NOTE — Telephone Encounter (Signed)
 Referral to pain clinic re-sent.

## 2024-02-13 NOTE — Telephone Encounter (Signed)
 Patient referral was sent for pain management , but they are not taking any new patients at this time. New referral need to be sent

## 2024-02-13 NOTE — Progress Notes (Addendum)
 Progress Note    Ethan Wells  GNF:[id number] DOB: [birth year]  DOA: 02/08/2024 PCP: Pcp, No      Brief Narrative:    Medical records reviewed and are as summarized below:   Ethan Wells is a 53 y.o. male with medical history significant for coronary artery disease status post PCI and stents, CHF, alcohol abuse, paroxysmal atrial fibrillation, GERD, peripheral neuropathy, dyslipidemia, and and depression, presented to the emergency room with acute onset of substernal chest pain graded 10/10 in severity with associated nausea and vomiting without diaphoresis.  He admitted to mild dyspnea and palpitations.  Upon arriving the hospital, his alcohol level was 342, troponin 159 peak, patient also developed hypotension, was given IV fluid bolus. Patient became agitated and confused the night of 2/24, treated for alcohol withdrawal.       Assessment/Plan:   Principal Problem:   NSTEMI (non-ST elevated myocardial infarction) (HCC) Active Problems:   Dyslipidemia   GERD without esophagitis   Hypokalemia   Depression   Pancytopenia (HCC)   Hypomagnesemia   PAF (paroxysmal atrial fibrillation) (HCC)   Class 1 obesity   Alcoholic cirrhosis of liver with ascites (HCC)   Alcoholic intoxication without complication (HCC)   Confusion   History of cocaine abuse (HCC)   Blood loss anemia    Body mass index is 32.12 kg/m.  (Obesity)   NSTEMI (non-ST elevated myocardial infarction) (HCC) History of coronary disease with stents. Troponins went from 19-159. Continue low-dose aspirin and high-dose Lipitor He was previously treated with IV heparin drip. Cardiologist recommended conservative management because of alcohol use disorder. 2D echo showed EF estimated at 55 to 60%, if normal LV diastolic parameters, mild MR   Paroxysmal atrial fibrillation Continue metoprolol if BP allows   Hypotension He was on midodrine at home for chronic hypotension.  Restart  midodrine. Lasix has been held   Alcohol use disorder with alcohol withdrawal. Acute toxic metabolic encephalopathy with recent agitation Mental status is better Decrease Librium from 25 mg 4 times daily to 25 mg 3 times daily.  Use Ativan as needed per CIWA protocol.   Pancytopenia secondary to liver disease. Leukopenia has resolved.   H&H improved s/p transfusion with 1 unit of PRBCs on 02/11/2024. H&H is stable Hemoglobin had gone down to 7.3.  Hemoglobin was 9.1 on admission. Fluctuating but stable platelet count. Pancytopenia likely from alcohol use and liver disease. He also has anemia of chronic disease Ammonia level was 69 but doubt hepatic encephalopathy at this time    Hypokalemia. Metabolic acidosis. Hypomagnesemia. Hypophosphatemia. Improved.  Continue phosphorus repletion with oral potassium phosphate.    Hypotension. BP stable.  S/p treatment with IV fluids. Cortisol level 6.5.  ACTH stimulation was ordered. On 02/12/2024, base cortisol level was 2.8, cortisol level after 30 minutes was 14.4 and cortisol level after 60 minutes was 18.1. Thus, adrenal insufficiency has been ruled out.   General weakness PT and OT recommended home health therapy   Comorbidities include depression, dyslipidemia, GERD, chronic pain Resume home sertraline and gabapentin   Possible discharge home in 1 to 2 days  Diet Order             DIET DYS 3 Room service appropriate? Yes; Fluid consistency: Thin  Diet effective now                            Consultants: Cardiologist  Procedures: None    Medications:  sodium chloride   Intravenous Once   aspirin EC  81 mg Oral Daily   atorvastatin  80 mg Oral Daily   chlordiazePOXIDE  25 mg Oral TID   folic acid  1 mg Oral Daily   furosemide  20 mg Oral Daily   gabapentin  100 mg Oral BID   metoprolol succinate  12.5 mg Oral Daily   midodrine  5 mg Oral TID WC   multivitamin with minerals  1 tablet Oral  Daily   nicotine  7 mg Transdermal Daily   pantoprazole (PROTONIX) IV  40 mg Intravenous Q12H   phosphorus  500 mg Oral TID   QUEtiapine  25 mg Oral QHS   sertraline  50 mg Oral Daily   sodium bicarbonate  650 mg Oral BID   thiamine  100 mg Oral Daily   Or   thiamine  100 mg Intravenous Daily   Continuous Infusions:     Anti-infectives (From admission, onward)    None              Family Communication/Anticipated D/C date and plan/Code Status   DVT prophylaxis: Place and maintain sequential compression device Start: 02/11/24 1134     Code Status: Full Code  Family Communication: None Disposition Plan: Plan to discharge home   Status is: Inpatient Remains inpatient appropriate because: Alcohol withdrawal syndrome       Subjective:   Interval events noted.  He complains of general body pain.  No dizziness, shortness of breath or chest pain.  Objective:    Vitals:   02/13/24 0846 02/13/24 0930 02/13/24 1314 02/13/24 1327  BP: (!) 85/49 (!) 82/44 (!) 86/46 (!) 86/50  Pulse: 90   83  Resp:      Temp: 98.5 F (36.9 C)   98.4 F (36.9 C)  TempSrc: Oral   Oral  SpO2: 97%   95%  Weight:      Height:       No data found.   Intake/Output Summary (Last 24 hours) at 02/13/2024 1348 Last data filed at 02/13/2024 0900 Gross per 24 hour  Intake 924.36 ml  Output 600 ml  Net 324.36 ml   Filed Weights   02/08/24 1743  Weight: 87.5 kg    Exam:  GEN: NAD SKIN: Warm and dry EYES: No pallor or icterus ENT: MMM CV: RRR PULM: CTA B ABD: soft, ND, NT, +BS CNS: AAO x 3, non focal EXT: No edema or tenderness         Data Reviewed:   I have personally reviewed following labs and imaging studies:  Labs: Labs show the following:   Basic Metabolic Panel: Recent Labs  Lab 02/09/24 0500 02/10/24 0456 02/11/24 0425 02/12/24 0437 02/13/24 0450  NA 139 136 137 133* 133*  K 3.4* 3.6 3.3* 3.3* 3.6  CL 108 109 113* 106 106  CO2 22 20* 19*  18* 18*  GLUCOSE 98 95 94 97 126*  BUN <5* 7 6 <5* 6  CREATININE 0.56* 0.63 0.65 0.77 0.86  CALCIUM 8.5* 7.7* 7.2* 7.7* 8.0*  MG 2.4 1.6* 1.7 2.1 1.9  PHOS 3.5 1.3* 1.8* 1.7* 2.4*   GFR Estimated Creatinine Clearance: 103.3 mL/min (by C-G formula based on SCr of 0.86 mg/dL). Liver Function Tests: Recent Labs  Lab 02/08/24 1739 02/10/24 0456 02/11/24 0425  AST 81* 67* 65*  ALT 13 13 13   ALKPHOS 254* 197* 190*  BILITOT 3.6* 6.1* 4.5*  PROT 7.7 6.7 6.3*  ALBUMIN 2.9* 2.5*  2.3*   No results for input(s): "LIPASE", "AMYLASE" in the last 168 hours. Recent Labs  Lab 02/11/24 0425  AMMONIA 69*   Coagulation profile Recent Labs  Lab 02/08/24 1759 02/08/24 2222  INR 1.5* 1.4*    CBC: Recent Labs  Lab 02/08/24 1739 02/09/24 0500 02/09/24 1612 02/10/24 0456 02/10/24 1707 02/11/24 0425 02/11/24 1706 02/11/24 2308 02/12/24 0437 02/12/24 1641 02/13/24 0450  WBC 5.4   < > 5.2 5.6  --  3.7*  --   --  6.5  --  5.4  NEUTROABS 3.6  --   --   --   --   --   --   --  4.9  --   --   HGB 9.1*   < > 8.2* 7.7*   < > 7.3* 7.9* 9.1* 9.2* 8.7* 8.7*  HCT 27.8*   < > 25.7* 23.2*  --  22.3*  --  28.4* 28.5*  --  27.2*  MCV 88.3   < > 89.5 87.5  --  90.3  --   --  89.1  --  89.8  PLT 105*   < > 85* 82*  --  57*  --   --  89*  --  60*   < > = values in this interval not displayed.   Cardiac Enzymes: No results for input(s): "CKTOTAL", "CKMB", "CKMBINDEX", "TROPONINI" in the last 168 hours. BNP (last 3 results) No results for input(s): "PROBNP" in the last 8760 hours. CBG: No results for input(s): "GLUCAP" in the last 168 hours. D-Dimer: No results for input(s): "DDIMER" in the last 72 hours. Hgb A1c: No results for input(s): "HGBA1C" in the last 72 hours. Lipid Profile: No results for input(s): "CHOL", "HDL", "LDLCALC", "TRIG", "CHOLHDL", "LDLDIRECT" in the last 72 hours. Thyroid function studies: No results for input(s): "TSH", "T4TOTAL", "T3FREE", "THYROIDAB" in the last 72  hours.  Invalid input(s): "FREET3"  Anemia work up: No results for input(s): "VITAMINB12", "FOLATE", "FERRITIN", "TIBC", "IRON", "RETICCTPCT" in the last 72 hours.  Sepsis Labs: Recent Labs  Lab 02/10/24 0456 02/11/24 0425 02/12/24 0437 02/13/24 0450  WBC 5.6 3.7* 6.5 5.4    Microbiology No results found for this or any previous visit (from the past 240 hours).  Procedures and diagnostic studies:  No results found.             LOS: 3 days   Avion Kutzer  [facility]   Pager on [email or web address]. If 7PM-7AM, please contact night-coverage at [email or web address]     02/13/2024, 1:48 PM

## 2024-02-13 NOTE — Care Management Important Message (Signed)
 Important Message  Patient Details  Name: Ethan Wells MRN: [id number] Date of Birth: [birth year]   Important Message Given:        Cristela Blue, CMA 02/13/2024, 10:52 AM

## 2024-02-13 NOTE — Plan of Care (Signed)

## 2024-02-13 NOTE — Plan of Care (Signed)
  Problem: Education: Goal: Understanding of cardiac disease, CV risk reduction, and recovery process will improve 02/13/2024 2330 by Velta Addison, RN Outcome: Progressing 02/13/2024 2324 by Velta Addison, RN Outcome: Progressing Goal: Individualized Educational Video(s) 02/13/2024 2330 by Velta Addison, RN Outcome: Progressing 02/13/2024 2324 by Velta Addison, RN Outcome: Progressing   Problem: Activity: Goal: Ability to tolerate increased activity will improve 02/13/2024 2330 by Velta Addison, RN Outcome: Progressing 02/13/2024 2324 by Velta Addison, RN Outcome: Progressing   Problem: Cardiac: Goal: Ability to achieve and maintain adequate cardiovascular perfusion will improve 02/13/2024 2330 by Velta Addison, RN Outcome: Progressing 02/13/2024 2324 by Velta Addison, RN Outcome: Progressing   Problem: Health Behavior/Discharge Planning: Goal: Ability to safely manage health-related needs after discharge will improve 02/13/2024 2330 by Velta Addison, RN Outcome: Progressing 02/13/2024 2324 by Velta Addison, RN Outcome: Progressing   Problem: Education: Goal: Knowledge of General Education information will improve Description: Including pain rating scale, medication(s)/side effects and non-pharmacologic comfort measures 02/13/2024 2330 by Velta Addison, RN Outcome: Progressing 02/13/2024 2324 by Velta Addison, RN Outcome: Progressing   Problem: Health Behavior/Discharge Planning: Goal: Ability to manage health-related needs will improve 02/13/2024 2330 by Velta Addison, RN Outcome: Progressing 02/13/2024 2324 by Velta Addison, RN Outcome: Progressing   Problem: Clinical Measurements: Goal: Ability to maintain clinical measurements within normal limits will improve 02/13/2024 2330 by Velta Addison, RN Outcome: Progressing 02/13/2024 2324 by Velta Addison, RN Outcome: Progressing Goal: Will remain free from  infection 02/13/2024 2330 by Velta Addison, RN Outcome: Progressing 02/13/2024 2324 by Velta Addison, RN Outcome: Progressing Goal: Diagnostic test results will improve 02/13/2024 2330 by Velta Addison, RN Outcome: Progressing 02/13/2024 2324 by Velta Addison, RN Outcome: Progressing Goal: Respiratory complications will improve 02/13/2024 2330 by Velta Addison, RN Outcome: Progressing 02/13/2024 2324 by Velta Addison, RN Outcome: Progressing Goal: Cardiovascular complication will be avoided 02/13/2024 2330 by Velta Addison, RN Outcome: Progressing 02/13/2024 2324 by Velta Addison, RN Outcome: Progressing   Problem: Activity: Goal: Risk for activity intolerance will decrease 02/13/2024 2330 by Velta Addison, RN Outcome: Progressing 02/13/2024 2324 by Velta Addison, RN Outcome: Progressing   Problem: Nutrition: Goal: Adequate nutrition will be maintained 02/13/2024 2330 by Velta Addison, RN Outcome: Progressing 02/13/2024 2324 by Velta Addison, RN Outcome: Progressing   Problem: Coping: Goal: Level of anxiety will decrease 02/13/2024 2330 by Velta Addison, RN Outcome: Progressing 02/13/2024 2324 by Velta Addison, RN Outcome: Progressing   Problem: Elimination: Goal: Will not experience complications related to bowel motility 02/13/2024 2330 by Velta Addison, RN Outcome: Progressing 02/13/2024 2324 by Velta Addison, RN Outcome: Progressing Goal: Will not experience complications related to urinary retention 02/13/2024 2330 by Velta Addison, RN Outcome: Progressing 02/13/2024 2324 by Velta Addison, RN Outcome: Progressing   Problem: Pain Managment: Goal: General experience of comfort will improve and/or be controlled 02/13/2024 2330 by Velta Addison, RN Outcome: Progressing 02/13/2024 2324 by Velta Addison, RN Outcome: Progressing   Problem: Safety: Goal: Ability to remain free from injury will  improve 02/13/2024 2330 by Velta Addison, RN Outcome: Progressing 02/13/2024 2324 by Velta Addison, RN Outcome: Progressing   Problem: Skin Integrity: Goal: Risk for impaired skin integrity will decrease 02/13/2024 2330 by Velta Addison, RN Outcome: Progressing 02/13/2024 2324 by Velta Addison, RN Outcome: Progressing

## 2024-02-13 NOTE — TOC Progression Note (Signed)
 Transition of Care [facility]) - Progression Note    Patient Details  Name: Ethan Wells MRN: [id number] Date of Birth: 1971-04-26  [facility] CM/SW Contact  Truddie Hidden, RN Phone Number: 02/13/2024, 9:47 AM  Clinical Narrative:    Spoke with patient regarding recommendation for [facility] PT. Patient stated he did not want HH.    Expected Discharge Plan: Home/Self Care Barriers to Discharge: Continued Medical Work up  Expected Discharge Plan and Services     Post Acute Care Choice: NA Living arrangements for the past 2 months: Single Family Home                                       Social Determinants of Health (SDOH) Interventions SDOH Screenings   Food Insecurity: No Food Insecurity (02/09/2024)  Housing: Low Risk  (02/09/2024)  Transportation Needs: No Transportation Needs (02/09/2024)  Utilities: Not At Risk (02/09/2024)  Social Connections: Unknown (02/09/2024)  Tobacco Use: High Risk (02/12/2024)    Readmission Risk Interventions     No data to display

## 2024-02-13 NOTE — Plan of Care (Signed)
  Problem: Education: Goal: Understanding of cardiac disease, CV risk reduction, and recovery process will improve Outcome: Progressing   Problem: Activity: Goal: Ability to tolerate increased activity will improve Outcome: Progressing   Problem: Cardiac: Goal: Ability to achieve and maintain adequate cardiovascular perfusion will improve Outcome: Progressing   Problem: Health Behavior/Discharge Planning: Goal: Ability to safely manage health-related needs after discharge will improve Outcome: Progressing   Problem: Education: Goal: Knowledge of General Education information will improve Description: Including pain rating scale, medication(s)/side effects and non-pharmacologic comfort measures Outcome: Progressing

## 2024-02-13 NOTE — Progress Notes (Signed)
 OT Cancellation Note  Patient Details Name: [name] MRN: [id number] DOB: Jul 01, 1971   Cancelled Treatment:    Reason Eval/Treat Not Completed: Medical issues which prohibited therapy. Pt with low BP readings 80's over 40-50 all day. Will hold therapy today and see as appropriate/medically appropriate.  Constance Goltz 02/13/2024, 1:58 PM

## 2024-02-14 DIAGNOSIS — I214 Non-ST elevation (NSTEMI) myocardial infarction: Secondary | ICD-10-CM | POA: Diagnosis not present

## 2024-02-14 LAB — BASIC METABOLIC PANEL
Anion gap: 8 (ref 5–15)
BUN: 9 mg/dL (ref 6–20)
CO2: 20 mmol/L — ABNORMAL LOW (ref 22–32)
Calcium: 7.6 mg/dL — ABNORMAL LOW (ref 8.9–10.3)
Chloride: 106 mmol/L (ref 98–111)
Creatinine, Ser: 1.17 mg/dL (ref 0.61–1.24)
GFR, Estimated: >60 mL/min (ref >60)
Glucose, Bld: 99 mg/dL (ref 70–99)
Potassium: 2.9 mmol/L — ABNORMAL LOW (ref 3.5–5.1)
Sodium: 134 mmol/L — ABNORMAL LOW (ref 135–145)

## 2024-02-14 LAB — PHOSPHORUS: Phosphorus: 3.5 mg/dL (ref 2.5–4.6)

## 2024-02-14 LAB — MAGNESIUM: Magnesium: 1.8 mg/dL (ref 1.7–2.4)

## 2024-02-14 LAB — HEMOGLOBIN: Hemoglobin: 8.3 g/dL — ABNORMAL LOW (ref 13.0–17.0)

## 2024-02-14 MED ORDER — POTASSIUM CHLORIDE CRYS ER 20 MEQ PO TBCR
40.0000 meq | EXTENDED_RELEASE_TABLET | ORAL | Status: AC
Start: 2024-02-14 — End: 2024-02-14
  Administered 2024-02-14 (×2): 40 meq via ORAL
  Filled 2024-02-14 (×2): qty 2

## 2024-02-14 MED ORDER — CHLORDIAZEPOXIDE HCL 10 MG PO CAPS
10.0000 mg | ORAL_CAPSULE | Freq: Three times a day (TID) | ORAL | Status: AC
Start: 2024-02-14 — End: 2024-02-15
  Administered 2024-02-14 – 2024-02-15 (×4): 10 mg via ORAL
  Filled 2024-02-14 (×4): qty 1

## 2024-02-14 MED ORDER — MAGNESIUM SULFATE 2 GM/50ML IV SOLN
2.0000 g | Freq: Once | INTRAVENOUS | Status: AC
  Administered 2024-02-14: 2 g via INTRAVENOUS
  Filled 2024-02-14: qty 50

## 2024-02-14 MED ORDER — POTASSIUM CHLORIDE 10 MEQ/100ML IV SOLN
10.0000 meq | INTRAVENOUS | Status: AC
  Administered 2024-02-14 (×2): 10 meq via INTRAVENOUS
  Filled 2024-02-14 (×2): qty 100

## 2024-02-14 MED ORDER — OXYCODONE HCL 5 MG PO TABS
5.0000 mg | ORAL_TABLET | Freq: Three times a day (TID) | ORAL | Status: DC | PRN
  Administered 2024-02-14 – 2024-02-15 (×3): 5 mg via ORAL
  Filled 2024-02-14 (×3): qty 1

## 2024-02-14 NOTE — Care Management Important Message (Signed)
 Important Message  Patient Details  Name: Ethan Wells MRN: [id number] Date of Birth: February 05, 1971   Important Message Given:  Yes - Medicare IM     Tanyika Barros W, CMA 02/14/2024, 9:43 AM

## 2024-02-14 NOTE — Progress Notes (Signed)
 Dr. Lurene Shadow made aware of blood pressure documented below. Per Dr. Myriam Forehand, "continue to monitor". No new orders placed.     02/14/24 1440  Vitals  BP (!) 87/46  MAP (mmHg) (!) 57  ECG Heart Rate 77  MEWS COLOR  MEWS Score Color Green  MEWS Score  MEWS Temp 0  MEWS Systolic 1  MEWS Pulse 0  MEWS RR 0  MEWS LOC 0  MEWS Score 1

## 2024-02-14 NOTE — Plan of Care (Signed)

## 2024-02-14 NOTE — Progress Notes (Signed)
 Dr. Lurene Shadow made aware of low BP reading as recorded below. 1200 Midodrine given. No new orders at this time.    02/14/24 1216  Vitals  Temp 98.4 F (36.9 C)  BP (!) 91/43  MAP (mmHg) (!) 57  BP Location Left Arm  BP Method Automatic  Patient Position (if appropriate) Lying  Pulse Rate 84  Pulse Rate Source Monitor  ECG Heart Rate 82  Resp 16  MEWS COLOR  MEWS Score Color Green  Oxygen Therapy  SpO2 96 %  O2 Device Room Air  MEWS Score  MEWS Temp 0  MEWS Systolic 1  MEWS Pulse 0  MEWS RR 0  MEWS LOC 0  MEWS Score 1

## 2024-02-14 NOTE — Plan of Care (Signed)

## 2024-02-14 NOTE — Progress Notes (Addendum)
 Progress Note    Ethan Wells  WUJ:[id number] DOB: 1971-06-20  DOA: 02/08/2024 PCP: Pcp, No      Brief Narrative:    Medical records reviewed and are as summarized below:   Ethan Wells is a 53 y.o. male with medical history significant for coronary artery disease status post PCI and stents, CHF, alcohol abuse, paroxysmal atrial fibrillation, GERD, peripheral neuropathy, dyslipidemia, and and depression, presented to the emergency room with acute onset of substernal chest pain graded 10/10 in severity with associated nausea and vomiting without diaphoresis.  He admitted to mild dyspnea and palpitations.  Upon arriving the hospital, his alcohol level was 342, troponin 159 peak, patient also developed hypotension, was given IV fluid bolus. Patient became agitated and confused the night of 2/24, treated for alcohol withdrawal.       Assessment/Plan:   Principal Problem:   NSTEMI (non-ST elevated myocardial infarction) (HCC) Active Problems:   Dyslipidemia   GERD without esophagitis   Hypokalemia   Depression   Pancytopenia (HCC)   Hypomagnesemia   PAF (paroxysmal atrial fibrillation) (HCC)   Class 1 obesity   Alcoholic cirrhosis of liver with ascites (HCC)   Alcoholic intoxication without complication (HCC)   Confusion   History of cocaine abuse (HCC)   Blood loss anemia    Body mass index is 32.12 kg/m.  (Obesity)   NSTEMI (non-ST elevated myocardial infarction) (HCC) History of coronary disease with stents. Troponins went from 19-159. Continue low-dose aspirin and high-dose Lipitor He was previously treated with IV heparin drip. Cardiologist recommended conservative management because of alcohol use disorder. 2D echo showed EF estimated at 55 to 60%, if normal LV diastolic parameters, mild MR   Paroxysmal atrial fibrillation Telemetry showed NSVT on 02/14/2024 Continue metoprolol   Hypotension Continue midodrine Lasix held because of  hypotension Fluctuating BP.  S/p treatment with IV fluids. Cortisol level 6.5.  ACTH stimulation was ordered. On 02/12/2024, base cortisol level was 2.8, cortisol level after 30 minutes was 14.4 and cortisol level after 60 minutes was 18.1. Thus, adrenal insufficiency has been ruled out.   Alcohol use disorder with alcohol withdrawal. Acute toxic metabolic encephalopathy with recent agitation Mental status is better Decrease Librium from 25 mg to 10 mg 3 times daily.  Use Ativan as needed per CIWA protocol.   Pancytopenia secondary to liver disease. Leukopenia has resolved.   H&H improved s/p transfusion with 1 unit of PRBCs on 02/11/2024. H&H is stable Hemoglobin had gone down to 7.3.  Hemoglobin was 9.1 on admission. Fluctuating but stable platelet count. Pancytopenia likely from alcohol use and liver disease. He also has anemia of chronic disease Ammonia level was 69 but doubt hepatic encephalopathy at this time    Hypokalemia. Metabolic acidosis. Hypomagnesemia. Hypophosphatemia. Potassium down to 2.9.  Replete with IV potassium chloride and oral potassium chloride. Magnesium is 1.8.  Replete with IV magnesium sulfate to keep magnesium at 2 or more   General weakness PT and OT recommended home health therapy   Comorbidities include depression, dyslipidemia, GERD, chronic pain Resume home sertraline and gabapentin   Possible discharge home in 1 to 2 days  Diet Order             DIET DYS 3 Room service appropriate? Yes; Fluid consistency: Thin  Diet effective now                            Consultants: Cardiologist  Procedures:  None    Medications:    sodium chloride   Intravenous Once   aspirin EC  81 mg Oral Daily   atorvastatin  80 mg Oral Daily   chlordiazePOXIDE  25 mg Oral TID   folic acid  1 mg Oral Daily   furosemide  20 mg Oral Daily   gabapentin  100 mg Oral BID   metoprolol succinate  12.5 mg Oral Daily   midodrine  5 mg Oral  TID WC   multivitamin with minerals  1 tablet Oral Daily   nicotine  7 mg Transdermal Daily   pantoprazole (PROTONIX) IV  40 mg Intravenous Q12H   potassium chloride  40 mEq Oral Q4H   QUEtiapine  25 mg Oral QHS   sertraline  50 mg Oral Daily   sodium bicarbonate  650 mg Oral BID   thiamine  100 mg Oral Daily   Or   thiamine  100 mg Intravenous Daily   Continuous Infusions:  potassium chloride 10 mEq (02/14/24 1214)      Anti-infectives (From admission, onward)    None              Family Communication/Anticipated D/C date and plan/Code Status   DVT prophylaxis: Place and maintain sequential compression device Start: 02/11/24 1134     Code Status: Full Code  Family Communication: None Disposition Plan: Plan to discharge home   Status is: Inpatient Remains inpatient appropriate because: Alcohol withdrawal syndrome       Subjective:   Interval events noted.  He complains of general body pains.  Objective:    Vitals:   02/13/24 1951 02/14/24 0035 02/14/24 0523 02/14/24 1216  BP: (!) 86/40 (!) 90/39 (!) 102/49 (!) 91/43  Pulse: 81 88 89 84  Resp: 18 15 15 16   Temp: 98.5 F (36.9 C) 98.2 F (36.8 C) 98.4 F (36.9 C) 98.4 F (36.9 C)  TempSrc: Oral Oral Oral   SpO2: 95% 97% 95% 96%  Weight:      Height:       No data found.   Intake/Output Summary (Last 24 hours) at 02/14/2024 1227 Last data filed at 02/14/2024 0556 Gross per 24 hour  Intake 437 ml  Output --  Net 437 ml   Filed Weights   02/08/24 1743  Weight: 87.5 kg    Exam:  GEN: NAD SKIN: Warm and dry EYES: No pallor or icterus ENT: MMM CV: RRR PULM: CTA B ABD: soft, ND, NT, +BS CNS: AAO x 3, non focal EXT: No edema or tenderness       Data Reviewed:   I have personally reviewed following labs and imaging studies:  Labs: Labs show the following:   Basic Metabolic Panel: Recent Labs  Lab 02/10/24 0456 02/11/24 0425 02/12/24 0437 02/13/24 0450 02/14/24 0538   NA 136 137 133* 133* 134*  K 3.6 3.3* 3.3* 3.6 2.9*  CL 109 113* 106 106 106  CO2 20* 19* 18* 18* 20*  GLUCOSE 95 94 97 126* 99  BUN 7 6 <5* 6 9  CREATININE 0.63 0.65 0.77 0.86 1.17  CALCIUM 7.7* 7.2* 7.7* 8.0* 7.6*  MG 1.6* 1.7 2.1 1.9 1.8  PHOS 1.3* 1.8* 1.7* 2.4* 3.5   GFR Estimated Creatinine Clearance: 76 mL/min (by C-G formula based on SCr of 1.17 mg/dL). Liver Function Tests: Recent Labs  Lab 02/08/24 1739 02/10/24 0456 02/11/24 0425  AST 81* 67* 65*  ALT 13 13 13   ALKPHOS 254* 197* 190*  BILITOT 3.6*  6.1* 4.5*  PROT 7.7 6.7 6.3*  ALBUMIN 2.9* 2.5* 2.3*   No results for input(s): "LIPASE", "AMYLASE" in the last 168 hours. Recent Labs  Lab 02/11/24 0425  AMMONIA 69*   Coagulation profile Recent Labs  Lab 02/08/24 1759 02/08/24 2222  INR 1.5* 1.4*    CBC: Recent Labs  Lab 02/08/24 1739 02/09/24 0500 02/09/24 1612 02/10/24 0456 02/10/24 1707 02/11/24 0425 02/11/24 1706 02/11/24 2308 02/12/24 0437 02/12/24 1641 02/13/24 0450 02/13/24 1639 02/14/24 0538  WBC 5.4   < > 5.2 5.6  --  3.7*  --   --  6.5  --  5.4  --   --   NEUTROABS 3.6  --   --   --   --   --   --   --  4.9  --   --   --   --   HGB 9.1*   < > 8.2* 7.7*   < > 7.3*   < > 9.1* 9.2* 8.7* 8.7* 8.9* 8.3*  HCT 27.8*   < > 25.7* 23.2*  --  22.3*  --  28.4* 28.5*  --  27.2*  --   --   MCV 88.3   < > 89.5 87.5  --  90.3  --   --  89.1  --  89.8  --   --   PLT 105*   < > 85* 82*  --  57*  --   --  89*  --  60*  --   --    < > = values in this interval not displayed.   Cardiac Enzymes: No results for input(s): "CKTOTAL", "CKMB", "CKMBINDEX", "TROPONINI" in the last 168 hours. BNP (last 3 results) No results for input(s): "PROBNP" in the last 8760 hours. CBG: No results for input(s): "GLUCAP" in the last 168 hours. D-Dimer: No results for input(s): "DDIMER" in the last 72 hours. Hgb A1c: No results for input(s): "HGBA1C" in the last 72 hours. Lipid Profile: No results for input(s):  "CHOL", "HDL", "LDLCALC", "TRIG", "CHOLHDL", "LDLDIRECT" in the last 72 hours. Thyroid function studies: No results for input(s): "TSH", "T4TOTAL", "T3FREE", "THYROIDAB" in the last 72 hours.  Invalid input(s): "FREET3"  Anemia work up: No results for input(s): "VITAMINB12", "FOLATE", "FERRITIN", "TIBC", "IRON", "RETICCTPCT" in the last 72 hours.  Sepsis Labs: Recent Labs  Lab 02/10/24 0456 02/11/24 0425 02/12/24 0437 02/13/24 0450  WBC 5.6 3.7* 6.5 5.4    Microbiology No results found for this or any previous visit (from the past 240 hours).  Procedures and diagnostic studies:  No results found.             LOS: 4 days   Aikam Hellickson  [facility]   Pager on [email or web address]. If 7PM-7AM, please contact night-coverage at [email or web address]     02/14/2024, 12:27 PM

## 2024-02-14 DEATH — deceased

## 2024-02-15 DIAGNOSIS — I214 Non-ST elevation (NSTEMI) myocardial infarction: Secondary | ICD-10-CM | POA: Diagnosis not present

## 2024-02-15 LAB — MAGNESIUM: Magnesium: 2.4 mg/dL (ref 1.7–2.4)

## 2024-02-15 LAB — RENAL FUNCTION PANEL
Albumin: 2.2 g/dL — ABNORMAL LOW (ref 3.5–5.0)
Anion gap: 7 (ref 5–15)
BUN: 11 mg/dL (ref 6–20)
CO2: 19 mmol/L — ABNORMAL LOW (ref 22–32)
Calcium: 7.3 mg/dL — ABNORMAL LOW (ref 8.9–10.3)
Chloride: 108 mmol/L (ref 98–111)
Creatinine, Ser: 1.28 mg/dL — ABNORMAL HIGH (ref 0.61–1.24)
GFR, Estimated: >60 mL/min (ref >60)
Glucose, Bld: 102 mg/dL — ABNORMAL HIGH (ref 70–99)
Phosphorus: 2.7 mg/dL (ref 2.5–4.6)
Potassium: 3.4 mmol/L — ABNORMAL LOW (ref 3.5–5.1)
Sodium: 134 mmol/L — ABNORMAL LOW (ref 135–145)

## 2024-02-15 MED ORDER — POTASSIUM CHLORIDE CRYS ER 20 MEQ PO TBCR
40.0000 meq | EXTENDED_RELEASE_TABLET | ORAL | Status: AC
  Administered 2024-02-15 (×2): 40 meq via ORAL
  Filled 2024-02-15 (×2): qty 2

## 2024-02-15 MED ORDER — MIDODRINE HCL 5 MG PO TABS
10.0000 mg | ORAL_TABLET | Freq: Three times a day (TID) | ORAL | Status: DC
  Administered 2024-02-15 – 2024-02-17 (×7): 10 mg via ORAL
  Filled 2024-02-15 (×7): qty 2

## 2024-02-15 MED ORDER — LACTATED RINGERS IV BOLUS
500.0000 mL | Freq: Once | INTRAVENOUS | Status: AC
  Administered 2024-02-15: 500 mL via INTRAVENOUS

## 2024-02-15 NOTE — Progress Notes (Addendum)
 Progress Note    Ethan Wells  [id number] DOB: Oct 12, 1971  DOA: 02/08/2024 PCP: Pcp, No      Brief Narrative:    Medical records reviewed and are as summarized below:   Ethan Wells is a 53 y.o. male with medical history significant for coronary artery disease status post PCI and stents, CHF, alcohol abuse, paroxysmal atrial fibrillation, GERD, peripheral neuropathy, dyslipidemia, and and depression, presented to the emergency room with acute onset of substernal chest pain graded 10/10 in severity with associated nausea and vomiting without diaphoresis.  He admitted to mild dyspnea and palpitations.  Upon arriving the hospital, his alcohol level was 342, troponin 159 peak, patient also developed hypotension, was given IV fluid bolus. Patient became agitated and confused the night of 2/24, treated for alcohol withdrawal.       Assessment/Plan:   Principal Problem:   NSTEMI (non-ST elevated myocardial infarction) (HCC) Active Problems:   Dyslipidemia   GERD without esophagitis   Hypokalemia   Depression   Pancytopenia (HCC)   Hypomagnesemia   PAF (paroxysmal atrial fibrillation) (HCC)   Class 1 obesity   Alcoholic cirrhosis of liver with ascites (HCC)   Alcoholic intoxication without complication (HCC)   Confusion   History of cocaine abuse (HCC)   Blood loss anemia    Body mass index is 32.12 kg/m.  (Obesity)   NSTEMI (non-ST elevated myocardial infarction) (HCC) History of coronary disease with stents. Troponins went from 19-159. Continue low-dose aspirin and high-dose Lipitor He was previously treated with IV heparin drip. Cardiologist recommended conservative management because of alcohol use disorder. 2D echo showed EF estimated at 55 to 60%, if normal LV diastolic parameters, mild MR   Paroxysmal atrial fibrillation Telemetry showed NSVT on 02/14/2024 Continue metoprolol   Hypotension Increase midodrine from 5 mg to 10 mg 3 times  daily. BP was down to 80/41 overnight. Ordered Ringer's lactate 1 L bolus Lasix has been discontinued. Cortisol level 6.5.  ACTH stimulation was ordered. On 02/12/2024, base cortisol level was 2.8, cortisol level after 30 minutes was 14.4 and cortisol level after 60 minutes was 18.1. Thus, adrenal insufficiency has been ruled out.   AKI Creatinine slowly trended upward from 0.63-1.28. Treat with IV fluids and monitor BMP   Alcohol use disorder with alcohol withdrawal. Acute toxic metabolic encephalopathy with recent agitation Mental status is better Discontinue Librium. Use Ativan as needed per CIWA protocol.   Pancytopenia secondary to liver disease. Leukopenia has resolved.   H&H improved s/p transfusion with 1 unit of PRBCs on 02/11/2024. H&H is stable Hemoglobin had gone down to 7.3.  Hemoglobin was 9.1 on admission. Fluctuating but stable platelet count. Pancytopenia likely from alcohol use and liver disease. He also has anemia of chronic disease Ammonia level was 69 but doubt hepatic encephalopathy at this time    Hypokalemia. Metabolic acidosis. Hypomagnesemia. Hypophosphatemia. Potassium up from 2.9-3.4.  Continue potassium repletion.  Magnesium and phosphorus levels have improved.   General weakness PT and OT recommended home health therapy   Comorbidities include depression, dyslipidemia, GERD, chronic pain Resume home sertraline and gabapentin   Possible discharge home in 1 to 2 days  Diet Order             DIET DYS 3 Room service appropriate? Yes; Fluid consistency: Thin  Diet effective now                            Consultants:  Cardiologist  Procedures: None    Medications:    sodium chloride   Intravenous Once   aspirin EC  81 mg Oral Daily   atorvastatin  80 mg Oral Daily   chlordiazePOXIDE  10 mg Oral TID   folic acid  1 mg Oral Daily   gabapentin  100 mg Oral BID   metoprolol succinate  12.5 mg Oral Daily    midodrine  5 mg Oral TID WC   multivitamin with minerals  1 tablet Oral Daily   nicotine  7 mg Transdermal Daily   pantoprazole (PROTONIX) IV  40 mg Intravenous Q12H   QUEtiapine  25 mg Oral QHS   sertraline  50 mg Oral Daily   sodium bicarbonate  650 mg Oral BID   thiamine  100 mg Oral Daily   Or   thiamine  100 mg Intravenous Daily   Continuous Infusions:      Anti-infectives (From admission, onward)    None              Family Communication/Anticipated D/C date and plan/Code Status   DVT prophylaxis: Place and maintain sequential compression device Start: 02/11/24 1134     Code Status: Full Code  Family Communication: None Disposition Plan: Plan to discharge home   Status is: Inpatient Remains inpatient appropriate because: Alcohol withdrawal syndrome       Subjective:   Interval events noted.  He complains of general weakness.  No other complaints.  Objective:    Vitals:   02/15/24 0521 02/15/24 0835 02/15/24 1343 02/15/24 1345  BP: (!) 88/46 (!) 90/47 (!) 96/42 (!) 83/46  Pulse: 71 74  71  Resp: 18 16  14   Temp: 98.1 F (36.7 C) 98.4 F (36.9 C)  (!) 97.5 F (36.4 C)  TempSrc: Oral Oral  Oral  SpO2: 97% 95%  100%  Weight:      Height:       No data found.   Intake/Output Summary (Last 24 hours) at 02/15/2024 1508 Last data filed at 02/15/2024 1100 Gross per 24 hour  Intake 425.67 ml  Output 400 ml  Net 25.67 ml   Filed Weights   02/08/24 1743  Weight: 87.5 kg    Exam:  GEN: NAD SKIN: Warm and dry EYES: No pallor or icterus ENT: MMM CV: RRR PULM: CTA B ABD: soft, ND, NT, +BS CNS: AAO x 3, non focal EXT: No edema or tenderness         Data Reviewed:   I have personally reviewed following labs and imaging studies:  Labs: Labs show the following:   Basic Metabolic Panel: Recent Labs  Lab 02/11/24 0425 02/12/24 0437 02/13/24 0450 02/14/24 0538 02/15/24 0454  NA 137 133* 133* 134* 134*  K 3.3* 3.3* 3.6  2.9* 3.4*  CL 113* 106 106 106 108  CO2 19* 18* 18* 20* 19*  GLUCOSE 94 97 126* 99 102*  BUN 6 <5* 6 9 11   CREATININE 0.65 0.77 0.86 1.17 1.28*  CALCIUM 7.2* 7.7* 8.0* 7.6* 7.3*  MG 1.7 2.1 1.9 1.8 2.4  PHOS 1.8* 1.7* 2.4* 3.5 2.7   GFR Estimated Creatinine Clearance: 69.4 mL/min (A) (by C-G formula based on SCr of 1.28 mg/dL (H)). Liver Function Tests: Recent Labs  Lab 02/08/24 1739 02/10/24 0456 02/11/24 0425 02/15/24 0454  AST 81* 67* 65*  --   ALT 13 13 13   --   ALKPHOS 254* 197* 190*  --   BILITOT 3.6* 6.1* 4.5*  --  PROT 7.7 6.7 6.3*  --   ALBUMIN 2.9* 2.5* 2.3* 2.2*   No results for input(s): "LIPASE", "AMYLASE" in the last 168 hours. Recent Labs  Lab 02/11/24 0425  AMMONIA 69*   Coagulation profile Recent Labs  Lab 02/08/24 1759 02/08/24 2222  INR 1.5* 1.4*    CBC: Recent Labs  Lab 02/08/24 1739 02/09/24 0500 02/09/24 1612 02/10/24 0456 02/10/24 1707 02/11/24 0425 02/11/24 1706 02/11/24 2308 02/12/24 0437 02/12/24 1641 02/13/24 0450 02/13/24 1639 02/14/24 0538  WBC 5.4   < > 5.2 5.6  --  3.7*  --   --  6.5  --  5.4  --   --   NEUTROABS 3.6  --   --   --   --   --   --   --  4.9  --   --   --   --   HGB 9.1*   < > 8.2* 7.7*   < > 7.3*   < > 9.1* 9.2* 8.7* 8.7* 8.9* 8.3*  HCT 27.8*   < > 25.7* 23.2*  --  22.3*  --  28.4* 28.5*  --  27.2*  --   --   MCV 88.3   < > 89.5 87.5  --  90.3  --   --  89.1  --  89.8  --   --   PLT 105*   < > 85* 82*  --  57*  --   --  89*  --  60*  --   --    < > = values in this interval not displayed.   Cardiac Enzymes: No results for input(s): "CKTOTAL", "CKMB", "CKMBINDEX", "TROPONINI" in the last 168 hours. BNP (last 3 results) No results for input(s): "PROBNP" in the last 8760 hours. CBG: No results for input(s): "GLUCAP" in the last 168 hours. D-Dimer: No results for input(s): "DDIMER" in the last 72 hours. Hgb A1c: No results for input(s): "HGBA1C" in the last 72 hours. Lipid Profile: No results for  input(s): "CHOL", "HDL", "LDLCALC", "TRIG", "CHOLHDL", "LDLDIRECT" in the last 72 hours. Thyroid function studies: No results for input(s): "TSH", "T4TOTAL", "T3FREE", "THYROIDAB" in the last 72 hours.  Invalid input(s): "FREET3"  Anemia work up: No results for input(s): "VITAMINB12", "FOLATE", "FERRITIN", "TIBC", "IRON", "RETICCTPCT" in the last 72 hours.  Sepsis Labs: Recent Labs  Lab 02/10/24 0456 02/11/24 0425 02/12/24 0437 02/13/24 0450  WBC 5.6 3.7* 6.5 5.4    Microbiology No results found for this or any previous visit (from the past 240 hours).  Procedures and diagnostic studies:  No results found.             LOS: 5 days   Alexiah Koroma  [facility]   Pager on [email or web address]. If 7PM-7AM, please contact night-coverage at [email or web address]     02/15/2024, 3:08 PM

## 2024-02-15 NOTE — Plan of Care (Signed)

## 2024-02-16 DIAGNOSIS — I214 Non-ST elevation (NSTEMI) myocardial infarction: Secondary | ICD-10-CM | POA: Diagnosis not present

## 2024-02-16 LAB — BASIC METABOLIC PANEL
Anion gap: 7 (ref 5–15)
BUN: 17 mg/dL (ref 6–20)
CO2: 17 mmol/L — ABNORMAL LOW (ref 22–32)
Calcium: 7.3 mg/dL — ABNORMAL LOW (ref 8.9–10.3)
Chloride: 109 mmol/L (ref 98–111)
Creatinine, Ser: 1.79 mg/dL — ABNORMAL HIGH (ref 0.61–1.24)
GFR, Estimated: 45 mL/min — ABNORMAL LOW (ref >60)
Glucose, Bld: 112 mg/dL — ABNORMAL HIGH (ref 70–99)
Potassium: 5.2 mmol/L — ABNORMAL HIGH (ref 3.5–5.1)
Sodium: 133 mmol/L — ABNORMAL LOW (ref 135–145)

## 2024-02-16 MED ORDER — LACTATED RINGERS IV SOLN: INTRAVENOUS | Status: DC

## 2024-02-16 MED ORDER — CHLORHEXIDINE GLUCONATE CLOTH 2 % EX PADS
6.0000 | MEDICATED_PAD | Freq: Every day | CUTANEOUS | Status: DC
  Administered 2024-02-17: 6 via TOPICAL

## 2024-02-16 MED ORDER — SODIUM BICARBONATE 8.4 % IV SOLN
INTRAVENOUS | Status: DC
  Filled 2024-02-16: qty 1000
  Filled 2024-02-16: qty 150
  Filled 2024-02-16 (×2): qty 1000
  Filled 2024-02-16: qty 150

## 2024-02-16 NOTE — Progress Notes (Signed)
 This encounter was created in error - please disregard.

## 2024-02-16 NOTE — Progress Notes (Signed)
 Progress Note    Ethan Wells  XLK:[id number] DOB: 08/02/1971  DOA: 02/08/2024 PCP: Pcp, No      Brief Narrative:    Medical records reviewed and are as summarized below:   Ethan Wells is a 53 y.o. male with medical history significant for coronary artery disease status post PCI and stents, CHF, alcohol abuse, paroxysmal atrial fibrillation, GERD, peripheral neuropathy, dyslipidemia, and and depression, presented to the emergency room with acute onset of substernal chest pain graded 10/10 in severity with associated nausea and vomiting without diaphoresis.  He admitted to mild dyspnea and palpitations.  Upon arriving the hospital, his alcohol level was 342, troponin 159 peak, patient also developed hypotension, was given IV fluid bolus. Patient became agitated and confused the night of 2/24, treated for alcohol withdrawal.       Assessment/Plan:   Principal Problem:   NSTEMI (non-ST elevated myocardial infarction) (HCC) Active Problems:   Dyslipidemia   GERD without esophagitis   Hypokalemia   Depression   Pancytopenia (HCC)   Hypomagnesemia   PAF (paroxysmal atrial fibrillation) (HCC)   Class 1 obesity   Alcoholic cirrhosis of liver with ascites (HCC)   Alcoholic intoxication without complication (HCC)   Confusion   History of cocaine abuse (HCC)   Blood loss anemia    Body mass index is 32.12 kg/m.  (Obesity)   NSTEMI (non-ST elevated myocardial infarction) (HCC) History of coronary disease with stents. Troponins went from 19-159. Continue low-dose aspirin and high-dose Lipitor He was previously treated with IV heparin drip. Cardiologist recommended conservative management because of alcohol use disorder. 2D echo showed EF estimated at 55 to 60%, if normal LV diastolic parameters, mild MR   Paroxysmal atrial fibrillation NSVT Continue metoprolol   Hypotension Continue midodrine BP is better but still on the low side Continue IV  fluids. Lasix has been discontinued. Cortisol level 6.5.  ACTH stimulation was ordered. On 02/12/2024, base cortisol level was 2.8, cortisol level after 30 minutes was 14.4 and cortisol level after 60 minutes was 18.1. Thus, adrenal insufficiency has been ruled out.   AKI, metabolic acidosis Worsening AKI.  Creatinine up from 1.28-1.79.   Start IV sodium bicarbonate infusion.  Monitor BMP. Bladder scan showed 796 mL of urine on 02/16/2024. Foley catheter has been placed to monitor urine output. 1 L of urine was drained after placement of Foley catheter.   Alcohol use disorder with alcohol withdrawal. Acute toxic metabolic encephalopathy with recent agitation He is more drowsy today. He is off of Librium. Use Ativan as needed per CIWA protocol.   Pancytopenia secondary to liver disease. Leukopenia has resolved.   H&H improved s/p transfusion with 1 unit of PRBCs on 02/11/2024. H&H is stable Hemoglobin had gone down to 7.3.  Hemoglobin was 9.1 on admission. Fluctuating but stable platelet count. Pancytopenia likely from alcohol use and liver disease. He also has anemia of chronic disease Ammonia level was 69 but doubt hepatic encephalopathy at this time    Hypokalemia. Mild hyperkalemia Metabolic acidosis. Hypomagnesemia. Hypophosphatemia. Mild hyperkalemia expected to improve with hydration. Magnesium and phosphorus levels have improved.   General weakness PT and OT recommended home health therapy   Comorbidities include depression, dyslipidemia, GERD, chronic pain Resume home sertraline and gabapentin     Diet Order             DIET DYS 3 Room service appropriate? Yes; Fluid consistency: Thin  Diet effective now  Consultants: Cardiologist  Procedures: None    Medications:    sodium chloride   Intravenous Once   aspirin EC  81 mg Oral Daily   atorvastatin  80 mg Oral Daily   Chlorhexidine Gluconate Cloth  6 each  Topical Daily   folic acid  1 mg Oral Daily   metoprolol succinate  12.5 mg Oral Daily   midodrine  10 mg Oral TID WC   multivitamin with minerals  1 tablet Oral Daily   nicotine  7 mg Transdermal Daily   QUEtiapine  25 mg Oral QHS   sertraline  50 mg Oral Daily   thiamine  100 mg Oral Daily   Or   thiamine  100 mg Intravenous Daily   Continuous Infusions:  sodium bicarbonate 150 mEq in dextrose 5 % 1,150 mL infusion 125 mL/hr at 02/16/24 0912       Anti-infectives (From admission, onward)    None              Family Communication/Anticipated D/C date and plan/Code Status   DVT prophylaxis: Place and maintain sequential compression device Start: 02/11/24 1134     Code Status: Full Code  Family Communication: None Disposition Plan: Plan to discharge home   Status is: Inpatient Remains inpatient appropriate because: Alcohol withdrawal syndrome       Subjective:   Interval events noted.  He complains of general weakness.  No other complaints.  Objective:    Vitals:   02/15/24 1807 02/15/24 2024 02/16/24 0426 02/16/24 0808  BP: (!) 94/49 (!) 98/52 (!) 97/58 (!) 110/59  Pulse: 71 77 76 76  Resp: 14 19 20 18   Temp: 98.2 F (36.8 C) 98 F (36.7 C) 98.3 F (36.8 C) 97.7 F (36.5 C)  TempSrc: Oral Oral Oral Oral  SpO2: 94% 96% 93% 94%  Weight:      Height:       No data found.  No intake or output data in the 24 hours ending 02/16/24 1238  Filed Weights   02/08/24 1743  Weight: 87.5 kg    Exam:  GEN: NAD SKIN: Warm and dry EYES: No pallor or icterus ENT: MMM CV: RRR PULM: CTA B ABD: soft, ND, NT, +BS CNS: AAO x 3, non focal EXT: No edema or tenderness    GEN: NAD SKIN: Warm an dry EYES: EOMI ENT: MMM CV: RRR PULM: CTA B ABD: soft, ND, NT, +BS CNS: Drowsy but arousable, non focal EXT: No edema or tenderness      Data Reviewed:   I have personally reviewed following labs and imaging studies:  Labs: Labs show the  following:   Basic Metabolic Panel: Recent Labs  Lab 02/11/24 0425 02/12/24 0437 02/13/24 0450 02/14/24 0538 02/15/24 0454 02/16/24 0504  NA 137 133* 133* 134* 134* 133*  K 3.3* 3.3* 3.6 2.9* 3.4* 5.2*  CL 113* 106 106 106 108 109  CO2 19* 18* 18* 20* 19* 17*  GLUCOSE 94 97 126* 99 102* 112*  BUN 6 <5* 6 9 11 17   CREATININE 0.65 0.77 0.86 1.17 1.28* 1.79*  CALCIUM 7.2* 7.7* 8.0* 7.6* 7.3* 7.3*  MG 1.7 2.1 1.9 1.8 2.4  --   PHOS 1.8* 1.7* 2.4* 3.5 2.7  --    GFR Estimated Creatinine Clearance: 49.7 mL/min (A) (by C-G formula based on SCr of 1.79 mg/dL (H)). Liver Function Tests: Recent Labs  Lab 02/10/24 0456 02/11/24 0425 02/15/24 0454  AST 67* 65*  --   ALT 13  13  --   ALKPHOS 197* 190*  --   BILITOT 6.1* 4.5*  --   PROT 6.7 6.3*  --   ALBUMIN 2.5* 2.3* 2.2*   No results for input(s): "LIPASE", "AMYLASE" in the last 168 hours. Recent Labs  Lab 02/11/24 0425  AMMONIA 69*   Coagulation profile No results for input(s): "INR", "PROTIME" in the last 168 hours.   CBC: Recent Labs  Lab 02/09/24 1612 02/10/24 0456 02/10/24 1707 02/11/24 0425 02/11/24 1706 02/11/24 2308 02/12/24 0437 02/12/24 1641 02/13/24 0450 02/13/24 1639 02/14/24 0538  WBC 5.2 5.6  --  3.7*  --   --  6.5  --  5.4  --   --   NEUTROABS  --   --   --   --   --   --  4.9  --   --   --   --   HGB 8.2* 7.7*   < > 7.3*   < > 9.1* 9.2* 8.7* 8.7* 8.9* 8.3*  HCT 25.7* 23.2*  --  22.3*  --  28.4* 28.5*  --  27.2*  --   --   MCV 89.5 87.5  --  90.3  --   --  89.1  --  89.8  --   --   PLT 85* 82*  --  57*  --   --  89*  --  60*  --   --    < > = values in this interval not displayed.   Cardiac Enzymes: No results for input(s): "CKTOTAL", "CKMB", "CKMBINDEX", "TROPONINI" in the last 168 hours. BNP (last 3 results) No results for input(s): "PROBNP" in the last 8760 hours. CBG: No results for input(s): "GLUCAP" in the last 168 hours. D-Dimer: No results for input(s): "DDIMER" in the last 72  hours. Hgb A1c: No results for input(s): "HGBA1C" in the last 72 hours. Lipid Profile: No results for input(s): "CHOL", "HDL", "LDLCALC", "TRIG", "CHOLHDL", "LDLDIRECT" in the last 72 hours. Thyroid function studies: No results for input(s): "TSH", "T4TOTAL", "T3FREE", "THYROIDAB" in the last 72 hours.  Invalid input(s): "FREET3"  Anemia work up: No results for input(s): "VITAMINB12", "FOLATE", "FERRITIN", "TIBC", "IRON", "RETICCTPCT" in the last 72 hours.  Sepsis Labs: Recent Labs  Lab 02/10/24 0456 02/11/24 0425 02/12/24 0437 02/13/24 0450  WBC 5.6 3.7* 6.5 5.4    Microbiology No results found for this or any previous visit (from the past 240 hours).  Procedures and diagnostic studies:  No results found.             LOS: 6 days   Ethan Wells  [facility]   Pager on [email or web address]. If 7PM-7AM, please contact night-coverage at [email or web address]     02/16/2024, 12:38 PM

## 2024-02-17 ENCOUNTER — Inpatient Hospital Stay

## 2024-02-17 DIAGNOSIS — I214 Non-ST elevation (NSTEMI) myocardial infarction: Secondary | ICD-10-CM | POA: Diagnosis not present

## 2024-02-17 DIAGNOSIS — N179 Acute kidney failure, unspecified: Secondary | ICD-10-CM | POA: Diagnosis not present

## 2024-02-17 LAB — CBC
HCT: 30.6 % — ABNORMAL LOW (ref 39.0–52.0)
Hemoglobin: 10.1 g/dL — ABNORMAL LOW (ref 13.0–17.0)
MCH: 28.1 pg (ref 26.0–34.0)
MCHC: 33 g/dL (ref 30.0–36.0)
MCV: 85.2 fL (ref 80.0–100.0)
Platelets: 124 10*3/uL — ABNORMAL LOW (ref 150–400)
RBC: 3.59 MIL/uL — ABNORMAL LOW (ref 4.22–5.81)
RDW: 22 % — ABNORMAL HIGH (ref 11.5–15.5)
WBC: 11.4 10*3/uL — ABNORMAL HIGH (ref 4.0–10.5)
nRBC: 0 % (ref 0.0–0.2)

## 2024-02-17 LAB — RENAL FUNCTION PANEL
Albumin: 1.9 g/dL — ABNORMAL LOW (ref 3.5–5.0)
Anion gap: 10 (ref 5–15)
BUN: 26 mg/dL — ABNORMAL HIGH (ref 6–20)
CO2: 24 mmol/L (ref 22–32)
Calcium: 6.7 mg/dL — ABNORMAL LOW (ref 8.9–10.3)
Chloride: 98 mmol/L (ref 98–111)
Creatinine, Ser: 2.91 mg/dL — ABNORMAL HIGH (ref 0.61–1.24)
GFR, Estimated: 25 mL/min — ABNORMAL LOW (ref >60)
Glucose, Bld: 176 mg/dL — ABNORMAL HIGH (ref 70–99)
Phosphorus: 2.1 mg/dL — ABNORMAL LOW (ref 2.5–4.6)
Potassium: 3.8 mmol/L (ref 3.5–5.1)
Sodium: 132 mmol/L — ABNORMAL LOW (ref 135–145)

## 2024-02-17 LAB — MAGNESIUM: Magnesium: 2.2 mg/dL (ref 1.7–2.4)

## 2024-02-17 MED ORDER — LACTATED RINGERS IV BOLUS
500.0000 mL | Freq: Once | INTRAVENOUS | Status: AC
  Administered 2024-02-17: 500 mL via INTRAVENOUS

## 2024-02-17 MED ORDER — LACTATED RINGERS IV SOLN: INTRAVENOUS | Status: DC

## 2024-02-17 MED ORDER — LOPERAMIDE HCL 2 MG PO CAPS
2.0000 mg | ORAL_CAPSULE | ORAL | Status: DC | PRN
  Administered 2024-02-17: 2 mg via ORAL
  Filled 2024-02-17: qty 1

## 2024-02-17 NOTE — Progress Notes (Signed)
 PT Cancellation Note  Patient Details Name: Ethan Wells MRN: [id number] DOB: Mar 23, 1971   Cancelled Treatment:    Reason Eval/Treat Not Completed: Fatigue/lethargy limiting ability to participate  Pt off unit for testing.  Upon return sleeping and did not awaken to voice.  Will return at a later time/date.   Danielle Dess 02/17/2024, 11:21 AM

## 2024-02-17 NOTE — Plan of Care (Signed)
  Problem: Cardiac: Goal: Ability to achieve and maintain adequate cardiovascular perfusion will improve Outcome: Progressing   Problem: Clinical Measurements: Goal: Will remain free from infection Outcome: Progressing   Problem: Pain Managment: Goal: General experience of comfort will improve and/or be controlled Outcome: Progressing   Problem: Safety: Goal: Ability to remain free from injury will improve Outcome: Progressing   Problem: Skin Integrity: Goal: Risk for impaired skin integrity will decrease Outcome: Progressing

## 2024-02-17 NOTE — Plan of Care (Signed)

## 2024-02-17 NOTE — TOC Progression Note (Signed)
 Transition of Care [facility]) - Progression Note    Patient Details  Name: Ethan Wells MRN: [id number] Date of Birth: 04-29-1971  Transition of Care [facility]) CM/SW Contact  Margarito Liner, LCSW Phone Number: 02/17/2024, 10:35 AM  Clinical Narrative:   CSW continues to follow progress.  Expected Discharge Plan: Home/Self Care Barriers to Discharge: Continued Medical Work up  Expected Discharge Plan and Services     Post Acute Care Choice: NA Living arrangements for the past 2 months: Single Family Home                                       Social Determinants of Health (SDOH) Interventions SDOH Screenings   Food Insecurity: No Food Insecurity (02/09/2024)  Housing: Low Risk  (02/09/2024)  Transportation Needs: No Transportation Needs (02/09/2024)  Utilities: Not At Risk (02/09/2024)  Social Connections: Unknown (02/09/2024)  Tobacco Use: High Risk (02/12/2024)    Readmission Risk Interventions     No data to display

## 2024-02-17 NOTE — Progress Notes (Signed)
 PT Cancellation Note  Patient Details Name: Ethan Wells MRN: [id number] DOB: 03-21-1971   Cancelled Treatment:   PT attempt. 2nd attempt today. Pt remains very lethargic and unsafe to participate in session at this time. Barely awakes with sternal rub but quickly drifted back to sleep after author attempted to orient pt to situation, time, setting, ect. Acute PT will continue to follow per current POC.    Rushie Chestnut 02/17/2024, 2:21 PM

## 2024-02-17 NOTE — Progress Notes (Addendum)
 Progress Note    Ethan Wells  EAV:[id number] DOB: April 12, 1971  DOA: 02/08/2024 PCP: Pcp, No      Brief Narrative:    Medical records reviewed and are as summarized below:   Ethan Wells is a 53 y.o. male with medical history significant for coronary artery disease status post PCI and stents, CHF, alcohol abuse, paroxysmal atrial fibrillation, GERD, peripheral neuropathy, dyslipidemia, and and depression, presented to the emergency room with acute onset of substernal chest pain graded 10/10 in severity with associated nausea and vomiting without diaphoresis.  He admitted to mild dyspnea and palpitations.  Upon arriving the hospital, his alcohol level was 342, troponin 159 peak, patient also developed hypotension, was given IV fluid bolus. Patient became agitated and confused the night of 2/24, treated for alcohol withdrawal.       Assessment/Plan:   Principal Problem:   NSTEMI (non-ST elevated myocardial infarction) (HCC) Active Problems:   Dyslipidemia   GERD without esophagitis   Hypokalemia   Depression   Pancytopenia (HCC)   Hypomagnesemia   PAF (paroxysmal atrial fibrillation) (HCC)   Class 1 obesity   Alcoholic cirrhosis of liver with ascites (HCC)   Alcoholic intoxication without complication (HCC)   Confusion   History of cocaine abuse (HCC)   Blood loss anemia   AKI (acute kidney injury) (HCC)    Body mass index is 32.12 kg/m.  (Obesity)   NSTEMI (non-ST elevated myocardial infarction) (HCC) History of coronary disease with stents. Troponins went from 19-159. Continue low-dose aspirin and high-dose Lipitor He was previously treated with IV heparin drip. Cardiologist recommended conservative management because of alcohol use disorder. 2D echo showed EF estimated at 55 to 60%, if normal LV diastolic parameters, mild MR   Paroxysmal atrial fibrillation NSVT Continue metoprolol   Hypotension Continue midodrine BP stable Continue IV  fluids. He was previously on oral Lasix Cortisol level 6.5.  ACTH stimulation was ordered. On 02/12/2024, base cortisol level was 2.8, cortisol level after 30 minutes was 14.4 and cortisol level after 60 minutes was 18.1. Thus, adrenal insufficiency has been ruled out.   Oliguric AKI, metabolic acidosis Worsening AKI.  Creatinine up from 1.28-1.79-2.01. Metabolic acidosis has improved with IV sodium bicarbonate infusion.  Change IV sodium bicarbonate infusion to IV Ringer's lactate infusion for AKI.  Urine output is poor.  Challenge kidney with 500 mL Ringer's lactate bolus. Renal ultrasound has been ordered for further evaluation Consulted Dr. Cherylann Ratel, nephrologist, to assist with management. 1 L of urine was drained from the bladder after Foley catheter was placed on 02/16/2024.   Alcohol use disorder with alcohol withdrawal. Acute toxic metabolic encephalopathy with recent agitation He is lethargic.  Continue supportive care. He is off of Librium. Use Ativan as needed per CIWA protocol.   Pancytopenia secondary to liver disease. Leukopenia has resolved.   H&H improved s/p transfusion with 1 unit of PRBCs on 02/11/2024. H&H is stable Hemoglobin had gone down to 7.3.  Hemoglobin was 9.1 on admission. Overall, platelet count has improved. Pancytopenia likely from alcohol use and liver disease. He also has anemia of chronic disease Ammonia level was 69 but doubt hepatic encephalopathy at this time    Hypokalemia. Mild hyperkalemia Metabolic acidosis. Hypomagnesemia. Hypophosphatemia. Potassium has normalized. Magnesium and phosphorus levels have improved.   General weakness PT and OT recommended home health therapy   Diarrhea Ethan Sheldon, RN, reported that patient had 3 watery stools this morning (02/17/2024). Imodium as needed.  Consider stool for GI panel if  diarrhea persists.   Comorbidities include depression, dyslipidemia, GERD, chronic pain Resume home sertraline and  gabapentin     Diet Order             DIET DYS 3 Room service appropriate? Yes; Fluid consistency: Thin  Diet effective now                            Consultants: Cardiologist  Procedures: None    Medications:    sodium chloride   Intravenous Once   aspirin EC  81 mg Oral Daily   atorvastatin  80 mg Oral Daily   Chlorhexidine Gluconate Cloth  6 each Topical Daily   folic acid  1 mg Oral Daily   metoprolol succinate  12.5 mg Oral Daily   midodrine  10 mg Oral TID WC   multivitamin with minerals  1 tablet Oral Daily   nicotine  7 mg Transdermal Daily   QUEtiapine  25 mg Oral QHS   sertraline  50 mg Oral Daily   thiamine  100 mg Oral Daily   Or   thiamine  100 mg Intravenous Daily   Continuous Infusions:  lactated ringers     lactated ringers 125 mL/hr at 02/17/24 9629       Anti-infectives (From admission, onward)    None              Family Communication/Anticipated D/C date and plan/Code Status   DVT prophylaxis: Place and maintain sequential compression device Start: 02/11/24 1134     Code Status: Full Code  Family Communication: None Disposition Plan: Plan to discharge home   Status is: Inpatient Remains inpatient appropriate because: Alcohol withdrawal syndrome       Subjective:   Interval events noted.  "I don't feel good".  He is unable to provide an adequate history  Objective:    Vitals:   02/16/24 1945 02/16/24 2326 02/17/24 0405 02/17/24 0915  BP: (!) 102/56 (!) 103/50 (!) 91/51 (!) 102/52  Pulse: 70 69 71 78  Resp: 18 20 20    Temp: (!) 97.4 F (36.3 C) 97.7 F (36.5 C) 98.4 F (36.9 C)   TempSrc: Oral Oral Oral   SpO2: 94% 94% 90% 92%  Weight:      Height:       No data found.   Intake/Output Summary (Last 24 hours) at 02/17/2024 1244 Last data filed at 02/17/2024 0700 Gross per 24 hour  Intake 2327.5 ml  Output 1350 ml  Net 977.5 ml    Filed Weights   02/08/24 1743  Weight: 87.5 kg     Exam:  GEN: NAD SKIN: Warm and dry EYES: No pallor or icterus ENT: MMM CV: RRR PULM: CTA B ABD: soft, ND, NT, +BS CNS: Lethargic, moves extremities spontaneously EXT: No edema or tenderness GU: Foley catheter with empty urine bag      Data Reviewed:   I have personally reviewed following labs and imaging studies:  Labs: Labs show the following:   Basic Metabolic Panel: Recent Labs  Lab 02/12/24 0437 02/13/24 0450 02/14/24 0538 02/15/24 0454 02/16/24 0504 02/17/24 0451  NA 133* 133* 134* 134* 133* 132*  K 3.3* 3.6 2.9* 3.4* 5.2* 3.8  CL 106 106 106 108 109 98  CO2 18* 18* 20* 19* 17* 24  GLUCOSE 97 126* 99 102* 112* 176*  BUN <5* 6 9 11 17  26*  CREATININE 0.77 0.86 1.17 1.28* 1.79* 2.91*  CALCIUM 7.7*  8.0* 7.6* 7.3* 7.3* 6.7*  MG 2.1 1.9 1.8 2.4  --  2.2  PHOS 1.7* 2.4* 3.5 2.7  --  2.1*   GFR Estimated Creatinine Clearance: 30.5 mL/min (A) (by C-G formula based on SCr of 2.91 mg/dL (H)). Liver Function Tests: Recent Labs  Lab 02/11/24 0425 02/15/24 0454 02/17/24 0451  AST 65*  --   --   ALT 13  --   --   ALKPHOS 190*  --   --   BILITOT 4.5*  --   --   PROT 6.3*  --   --   ALBUMIN 2.3* 2.2* 1.9*   No results for input(s): "LIPASE", "AMYLASE" in the last 168 hours. Recent Labs  Lab 02/11/24 0425  AMMONIA 69*   Coagulation profile No results for input(s): "INR", "PROTIME" in the last 168 hours.   CBC: Recent Labs  Lab 02/11/24 0425 02/11/24 1706 02/11/24 2308 02/12/24 0437 02/12/24 1641 02/13/24 0450 02/13/24 1639 02/14/24 0538 02/17/24 0451  WBC 3.7*  --   --  6.5  --  5.4  --   --  11.4*  NEUTROABS  --   --   --  4.9  --   --   --   --   --   HGB 7.3*   < > 9.1* 9.2* 8.7* 8.7* 8.9* 8.3* 10.1*  HCT 22.3*  --  28.4* 28.5*  --  27.2*  --   --  30.6*  MCV 90.3  --   --  89.1  --  89.8  --   --  85.2  PLT 57*  --   --  89*  --  60*  --   --  124*   < > = values in this interval not displayed.   Cardiac Enzymes: No results for  input(s): "CKTOTAL", "CKMB", "CKMBINDEX", "TROPONINI" in the last 168 hours. BNP (last 3 results) No results for input(s): "PROBNP" in the last 8760 hours. CBG: No results for input(s): "GLUCAP" in the last 168 hours. D-Dimer: No results for input(s): "DDIMER" in the last 72 hours. Hgb A1c: No results for input(s): "HGBA1C" in the last 72 hours. Lipid Profile: No results for input(s): "CHOL", "HDL", "LDLCALC", "TRIG", "CHOLHDL", "LDLDIRECT" in the last 72 hours. Thyroid function studies: No results for input(s): "TSH", "T4TOTAL", "T3FREE", "THYROIDAB" in the last 72 hours.  Invalid input(s): "FREET3"  Anemia work up: No results for input(s): "VITAMINB12", "FOLATE", "FERRITIN", "TIBC", "IRON", "RETICCTPCT" in the last 72 hours.  Sepsis Labs: Recent Labs  Lab 02/11/24 0425 02/12/24 0437 02/13/24 0450 02/17/24 0451  WBC 3.7* 6.5 5.4 11.4*    Microbiology No results found for this or any previous visit (from the past 240 hours).  Procedures and diagnostic studies:  No results found.             LOS: 7 days   Aveyah Greenwood  [facility]   Pager on [email or web address]. If 7PM-7AM, please contact night-coverage at [email or web address]     02/17/2024, 12:44 PM

## 2024-02-18 ENCOUNTER — Encounter: Payer: Self-pay | Admitting: Adult Health

## 2024-02-18 DIAGNOSIS — I214 Non-ST elevation (NSTEMI) myocardial infarction: Secondary | ICD-10-CM | POA: Diagnosis not present

## 2024-02-18 LAB — GLUCOSE, CAPILLARY: Glucose-Capillary: 100 mg/dL — ABNORMAL HIGH (ref 70–99)

## 2024-02-24 ENCOUNTER — Other Ambulatory Visit (HOSPITAL_COMMUNITY): Payer: Self-pay

## 2024-03-03 ENCOUNTER — Ambulatory Visit: Payer: Medicare HMO | Admitting: Physician Assistant

## 2024-03-16 NOTE — ED Provider Notes (Signed)
 [facility] Department of Emergency Medicine   Code Blue CONSULT NOTE  Chief Complaint: Cardiac arrest/unresponsive   Level V Caveat: Unresponsive  History of present illness: I was contacted by the hospital for a CODE BLUE cardiac arrest upstairs and presented to the patient's bedside.   Patient reportedly developed sinus tachycardia and then went unresponsive the nurse was not able to palpate a pulse so called a CODE BLUE.  When I arrived, chest compressions were in progress and the patient was completely unresponsive.  See course for additional details.  ROS: Unable to obtain, Level V caveat  Scheduled Meds:  sodium chloride   Intravenous Once   aspirin EC  81 mg Oral Daily   atorvastatin  80 mg Oral Daily   Chlorhexidine Gluconate Cloth  6 each Topical Daily   folic acid  1 mg Oral Daily   metoprolol succinate  12.5 mg Oral Daily   midodrine  10 mg Oral TID WC   multivitamin with minerals  1 tablet Oral Daily   nicotine  7 mg Transdermal Daily   QUEtiapine  25 mg Oral QHS   sertraline  50 mg Oral Daily   thiamine  100 mg Oral Daily   Or   thiamine  100 mg Intravenous Daily   Continuous Infusions:  lactated ringers 125 mL/hr at 02/17/24 2242   PRN Meds:.acetaminophen, ALPRAZolam, loperamide, magnesium hydroxide, methocarbamol, ondansetron (ZOFRAN) IV, oxyCODONE, traZODone Past Medical History:  Diagnosis Date   Myocardial infarct [facility])    patietn states   History reviewed. No pertinent surgical history. Social History   Socioeconomic History   Marital status: Single    Spouse name: Not on file   Number of children: Not on file   Years of education: Not on file   Highest education level: Not on file  Occupational History   Not on file  Tobacco Use   Smoking status: Some Days    Types: Cigarettes   Smokeless tobacco: Current  Substance and Sexual Activity   Alcohol use: Yes    Alcohol/week: 2.0 standard drinks of alcohol    Types: 2  Glasses of wine per week   Drug use: Not Currently   Sexual activity: Not Currently  Other Topics Concern   Not on file  Social History Narrative   Not on file   Social Drivers of Health   Financial Resource Strain: Not on file  Food Insecurity: No Food Insecurity (02/09/2024)   Hunger Vital Sign    Worried About Running Out of Food in the Last Year: Never true    Ran Out of Food in the Last Year: Never true  Transportation Needs: No Transportation Needs (02/09/2024)   PRAPARE - Administrator, Civil Service (Medical): No    Lack of Transportation (Non-Medical): No  Physical Activity: Not on file  Stress: Not on file  Social Connections: Unknown (02/09/2024)   Social Connection and Isolation Panel [NHANES]    Frequency of Communication with Friends and Family: More than three times a week    Frequency of Social Gatherings with Friends and Family: More than three times a week    Attends Religious Services: Never    Database administrator or Organizations: No    Attends Banker Meetings: Never    Marital Status: Patient declined  Intimate Partner Violence: Not At Risk (02/09/2024)   Humiliation, Afraid, Rape, and Kick questionnaire    Fear of Current or Ex-Partner: No  Emotionally Abused: No    Physically Abused: No    Sexually Abused: No   Allergies  Allergen Reactions   Nsaids Other (See Comments)    GI issues  Pt takes ASA 81 mg daily    Penicillins Hives    Last set of Vital Signs (not current) Vitals:   02/17/24 2352 03/15/2024 0013  BP: (!) 85/71 105/63  Pulse:    Resp: 20   Temp: 98.2 F (36.8 C)   SpO2: 94%       Physical Exam  Gen: unresponsive Cardiovascular: pulseless  Resp: apneic. Breath sounds equal bilaterally with bagging  Abd: distended Neuro: GCS 3, unresponsive to pain  HEENT: No blood in posterior pharynx, gag reflex absent, copious respiratory secretions in airway Neck: No crepitus  Musculoskeletal: No deformity   Skin: warm, jaundiced  Procedures (when applicable, including Critical Care time): .Critical Care  Performed by: Loleta Rose, MD Authorized by: Loleta Rose, MD   Critical care provider statement:    Critical care time (minutes):  14   Critical care time was exclusive of:  Separately billable procedures and treating other patients   Critical care was necessary to treat or prevent imminent or life-threatening deterioration of the following conditions:  Cardiac failure, circulatory failure, respiratory failure and CNS failure or compromise   Critical care was time spent personally by me on the following activities:  Development of treatment plan with patient or surrogate, evaluation of patient's response to treatment, examination of patient, obtaining history from patient or surrogate, ordering and performing treatments and interventions, ordering and review of laboratory studies, ordering and review of radiographic studies, pulse oximetry, re-evaluation of patient's condition and review of old charts Date/Time: 02/20/2024 1:40 AM  Performed by: Loleta Rose, MDOxygen Delivery Method: Ambu bag Preoxygenation: Pre-oxygenation with 100% oxygen Tube size: 7.5 mm Number of attempts: 1 Placement Confirmation: ETT inserted through vocal cords under direct vision, CO2 detector, Positive ETCO2 and Breath sounds checked- equal and bilateral Tube secured with: ETT holder       MDM / Assessment and Plan I was present in the COVID along with Cheryll Cockayne [place], ICU NP, and we somewhat simultaneously ran the code.  See code flow sheet for details.  After multiple rounds of epinephrine and chest compressions with asystole on the monitor, we got a fine V-fib on the monitor and performed a 200 J defibrillation and then resume compressions.  Patient received bicarb and other medications as per nursing notes.  At the point the Dr. Para March with the hospitalist service arrived in person, CPR had been  in progress for about 16 minutes.  A palpable pulse with an organized rhythm on the monitor was obtained but only for a couple of minutes and at the point that I left the code and Dr. Para March and Ms. Rust-Chester were still at the bedside, the patient was again pulseless and chest compressions were being performed again.    Loleta Rose, MD 02/20/2024 [phone]

## 2024-03-16 NOTE — Progress Notes (Signed)
  Event progress Note CODE BLUE   [name] KGM:[id number] DOB: 1971/07/24 DOA: 02/08/2024     8 DOS: the patient was seen and examined on 03/08/2024   Brief hospital course: Ethan Wells is a 53 y.o. male with medical history significant for coronary artery disease status post PCI and stents, CHF, alcohol abuse, paroxysmal atrial fibrillation, admitted with NSTEMI with stay complicated by hypotension, oliguric AKI, alcohol withdrawal and pancytopenia requiring transfusion   CODE BLUE NOTE  Patient Name: Ethan Wells   MRN: [id number]   Date of Birth/ Sex: 1971/09/30 , male      Admission Date: 02/08/2024  Attending Provider: No att. providers found  Primary Diagnosis: NSTEMI (non-ST elevated myocardial infarction) [facility])    Indication: Patient was found unresponsive. Code blue was subsequently called. At the time of arrival on scene, ACLS protocol was underway with "being run by ED provider, Dr. York Cerise and ICU NP Cheryll Cockayne [place].  Upon my arrival patient was already intubated and had received several rounds of epi as well as bicarb.  Patient was reported to be in asystole and then subsequently V-fib for which she underwent defibrillation.  At next pulse check he was in PEA.  CPR was continued however after about 22 minutes, he was still in PEA.  At this time I called and spoke with his only contact, Gwynn, Suzie Portela who advised to continue CPR until she got in touch with his mother.Gwynn called in another 10 minutes to advise " let him go". Resuscitative measures ended.   Resuscitative measures were discontinued.  No spontaneous movements were present. There was not response to verbal or tactile stimuli. Pupils were mid-dilated and fixed. No breath sounds were appreciated over either lung field. No carotid pulses were palpable. No hear sounds were auscultated over the entire precordium. Patient pronounced dead at date and time.   Time of death 2:02 AM  Technical  Description: Please refer to code log  Last vitals recorder Physical Exam: Vitals:   02/17/24 1627 02/17/24 1953 02/17/24 2352 03/08/2024 0013  BP: (!) 97/48 105/68 (!) 85/71 105/63  Pulse: 81 94    Resp: 16 17 20    Temp: 97.8 F (36.6 C) 98 F (36.7 C) 98.2 F (36.8 C)   TempSrc: Oral Oral Axillary   SpO2: 93% 93% 94%   Weight:      Height:           CRITICAL CARE Performed by: Andris Baumann   Total critical care time: 35 minutes  Critical care time was exclusive of separately billable procedures and treating other patients.  Critical care was necessary to treat or prevent imminent or life-threatening deterioration.  Critical care was time spent personally by me on the following activities: development of treatment plan with patient and/or surrogate as well as nursing, discussions with consultants, evaluation of patient's response to treatment, examination of patient, obtaining history from patient or surrogate, ordering and performing treatments and interventions, ordering and review of laboratory studies, ordering and review of radiographic studies, pulse oximetry and re-evaluation of patient's condition.    Author: Andris Baumann, MD 03/04/2024 4:05 AM  For on call review [email or web address].

## 2024-03-16 NOTE — Progress Notes (Signed)
 Ethan Wells is a 53 y.o. male with medical history significant for coronary artery disease status post PCI and stents, CHF, alcohol abuse, paroxysmal atrial fibrillation, GERD, peripheral neuropathy, dyslipidemia, and and depression, presented to the emergency room with acute onset of substernal chest pain graded 10/10 in severity with associated nausea and vomiting without diaphoresis. He admitted to mild dyspnea and palpitations. Upon arriving the hospital, his alcohol level was 342, troponin 159 peak, patient also developed hypotension, was given IV fluid bolus. patent is on Yellow MEWS and I observe some blood on his stool. BP- 85/71 map 77, HR 124. patient is running on LR @ 119ml/hr. any recommendation for HR and BP. thanks   Patient was asymptomatic.  Per NP Geradine Girt MD Not at this time. Keep the fluids running. If pt becomes symptomatic, let me know. Thanks.  1:22am I looked at the monitor HR was 77bpm with apnea notes 31 seconds I checked the patient and he was unresponsive, called code blue CPR started. Code blue team came at bedside assisted with the code.

## 2024-03-16 NOTE — Progress Notes (Signed)
 Cousin Sharene Butters made aware of patient change in status.

## 2024-03-16 NOTE — Death Summary Note (Addendum)
 DEATH SUMMARY   Patient Details  Name: Ethan Wells MRN: [id number] DOB: [birth year] PCP:Pcp, No Admission/Discharge Information   Admit Date:  03/02/24  Date of Death: Date of Death: Mar 12, 2024  Time of Death: Time of Death: 0202  Length of Stay: 8   Principle Cause of death: Acute non-ST elevation myocardial infarction  Hospital Diagnoses: Principal Problem:   NSTEMI (non-ST elevated myocardial infarction) (HCC) Active Problems:   Dyslipidemia   GERD without esophagitis   Hypokalemia   Depression   Pancytopenia (HCC)   Hypomagnesemia   PAF (paroxysmal atrial fibrillation) (HCC)   Class 1 obesity   Alcoholic cirrhosis of liver with ascites (HCC)   Alcoholic intoxication without complication (HCC)   Confusion   History of cocaine abuse (HCC)   Blood loss anemia   AKI (acute kidney injury) [facility])   Hospital Course:  Ethan Wells was a 53 y.o. male with medical history significant for coronary artery disease status post PCI and stents, CHF, alcohol abuse, paroxysmal atrial fibrillation, GERD, peripheral neuropathy, dyslipidemia, and and depression, presented to the emergency room with acute onset of substernal chest pain graded 10/10 in severity with associated nausea and vomiting without diaphoresis.  He admitted to mild dyspnea and palpitations.  Upon arriving the hospital, his alcohol level was 342, troponin 159 peak, patient also developed hypotension, was given IV fluid bolus. Patient became agitated and confused the night of 2/24, treated for alcohol withdrawal.     Assessment and Plan: NSTEMI (non-ST elevated myocardial infarction) (HCC) History of coronary disease with stents. Troponins went from 19-159. He was treated with low-dose aspirin, high-dose Lipitor and IV heparin infusion. Cardiologist recommended conservative management because of alcohol use disorder. 2D echo showed EF estimated at 55 to 60%, if normal LV diastolic parameters, mild MR      Paroxysmal atrial fibrillation NSVT He was treated with metoprolol     Hypotension He was treated with IV fluids and midodrine Cortisol level 6.5.  ACTH stimulation was ordered. On 02/12/2024, base cortisol level was 2.8, cortisol level after 30 minutes was 14.4 and cortisol level after 60 minutes was 18.1. Thus, adrenal insufficiency has been ruled out.     Oliguric AKI, metabolic acidosis Worsening AKI.  Creatinine up from 1.28-1.79-2.91. He was treated with IV fluids including IV sodium bicarbonate drip. Renal ultrasound did not show any evidence of obstruction or acute abnormality 1 L of urine was drained from the bladder after Foley catheter was placed on 02/16/2024.     Alcohol use disorder with alcohol withdrawal. Acute toxic metabolic encephalopathy with recent agitation He was treated with Librium and IV Ativan per CIWA protocol for alcohol withdrawal syndrome.     Pancytopenia secondary to liver disease. Leukopenia has resolved.   Hemoglobin had dropped to 7.3 (from 9.1 on admission).   S/p transfusion with 1 unit of PRBCs on 02/11/2024. Posttransfusion hemoglobin remained stable after transfusion. Pancytopenia likely from alcohol use and liver disease. He also had anemia of chronic disease Ammonia level was 69 but did not have hepatic encephalopathy     Hypokalemia. Mild hyperkalemia Metabolic acidosis. Hypomagnesemia. Hypophosphatemia. He had multiple electrolyte abnormalities that were repleted.     General weakness He was evaluated by PT and OT who had recommended home health therapy     Diarrhea Morrie Sheldon, RN, reported that patient had 3 watery stools on (02/17/2024). Imodium as needed was ordered     Comorbidities include depression, dyslipidemia, GERD, chronic pain   Unfortunately, patient's condition deteriorated.  He went into cardiac arrest and CPR was initiated.  Chart review showed that Sharene Butters (cousin) was contacted and she requested that  CPR be terminated.  He expired on 02/20/2024 at 2:02 AM            Procedures: None  Consultations: Cardiologist, nephrologist  The results of significant diagnostics from this hospitalization (including imaging, microbiology, ancillary and laboratory) are listed below for reference.   Significant Diagnostic Studies: US RENAL Result Date: 02/17/2024 CLINICAL DATA:  Acute kidney injury. EXAM: RENAL / URINARY TRACT ULTRASOUND COMPLETE COMPARISON:  None Available. FINDINGS: Right Kidney: Renal measurements: 10.4 x 5.4 x 4.3 cm = volume: 127 mL. Echogenicity within normal limits. No mass or hydronephrosis visualized. Left Kidney: Renal measurements: 10.5 x 4.3 x 4.9 cm = volume: 117 mL. Echogenicity within normal limits. No mass or hydronephrosis visualized. Bladder: Decompressed secondary to Foley catheter. Other: None. IMPRESSION: No significant renal abnormality seen. Electronically Signed   By: Lupita Raider M.D.   On: 02/17/2024 14:27   CT Angio Chest Pulmonary Embolism (PE) W or WO Contrast Result Date: 02/09/2024 CLINICAL DATA:  Pulmonary embolism (PE) suspected, high prob. Substernal chest pain with nausea and vomiting. EXAM: CT ANGIOGRAPHY CHEST WITH CONTRAST TECHNIQUE: Multidetector CT imaging of the chest was performed using the standard protocol during bolus administration of intravenous contrast. Multiplanar CT image reconstructions and MIPs were obtained to evaluate the vascular anatomy. RADIATION DOSE REDUCTION: This exam was performed according to the departmental dose-optimization program which includes automated exposure control, adjustment of the mA and/or kV according to patient size and/or use of iterative reconstruction technique. CONTRAST:  75mL OMNIPAQUE IOHEXOL 350 MG/ML SOLN COMPARISON:  CT angiography chest from 01/03/2024. FINDINGS: Cardiovascular: No evidence of embolism to the proximal subsegmental pulmonary artery level. There is dilation of the main pulmonary trunk  measuring up to 3.5 cm, which is nonspecific but can be seen with pulmonary artery hypertension. Normal cardiac size. No pericardial effusion. No aortic aneurysm. There are coronary artery calcifications, in keeping with coronary artery disease. There are also minimal peripheral atherosclerotic vascular calcifications of thoracic aorta and its major branches. Mediastinum/Nodes: Visualized thyroid gland appears grossly unremarkable. No solid / cystic mediastinal masses. The esophagus is nondistended precluding optimal assessment. No axillary, mediastinal or hilar lymphadenopathy by size criteria. Lungs/Pleura: The central tracheo-bronchial tree is patent. There are patchy areas of linear, plate-like atelectasis and/or scarring throughout bilateral lungs. No mass or consolidation. No pleural effusion or pneumothorax. There is a sub 3 mm solid noncalcified nodule in the subpleural left lung lower lobe (series 100, image 108), which is unchanged since the prior study. No new or suspicious lung nodules. Upper Abdomen: There is a small sliding hiatal hernia. Tips noted in the central liver. Remaining visualized upper abdominal viscera within normal limits. Musculoskeletal: The visualized soft tissues of the chest wall are grossly unremarkable. No suspicious osseous lesions. There are mild multilevel degenerative changes in the visualized spine. Review of the MIP images confirms the above findings. IMPRESSION: 1. No embolism to the proximal subsegmental pulmonary artery level. 2. No lung mass, consolidation, pleural effusion or pneumothorax. 3. Multiple other nonacute observations, as described above. Aortic Atherosclerosis (ICD10-I70.0). Electronically Signed   By: Jules Schick M.D.   On: 02/09/2024 16:48   ECHOCARDIOGRAM COMPLETE Result Date: 02/09/2024    ECHOCARDIOGRAM REPORT   Patient Name:   EWARD RUTIGLIANO Date of Exam: 02/09/2024 Medical Rec #:  [id number]         Height:  65.0 in Accession #:     [id number]        Weight:       193.0 lb Date of Birth:  1971-06-03         BSA:          1.948 m Patient Age:    52 years          BP:           89/48 mmHg Patient Gender: M                 HR:           74 bpm. Exam Location:  [facility] Procedure: 2D Echo, Cardiac Doppler, Color Doppler and Strain Analysis (Both            Spectral and Color Flow Doppler were utilized during procedure). Indications:     NSTEMI  History:         Patient has no prior history of Echocardiogram examinations.                  Arrythmias:Atrial Fibrillation, Signs/Symptoms:Chest Pain; Risk                  Factors:Dyslipidemia. NSTEMI.  Sonographer:     Mikki Harbor Referring Phys:  [id number] JAN A MANSY Diagnosing Phys: Marcina Millard MD IMPRESSIONS  1. Left ventricular ejection fraction, by estimation, is 55 to 60%. The left ventricle has normal function. The left ventricle has no regional wall motion abnormalities. Left ventricular diastolic parameters were normal. The global longitudinal strain is normal.  2. Right ventricular systolic function is normal. The right ventricular size is normal. There is normal pulmonary artery systolic pressure.  3. The mitral valve is normal in structure. Mild mitral valve regurgitation. No evidence of mitral stenosis.  4. The aortic valve is normal in structure. Aortic valve regurgitation is not visualized. No aortic stenosis is present.  5. The inferior vena cava is normal in size with greater than 50% respiratory variability, suggesting right atrial pressure of 3 mmHg. FINDINGS  Left Ventricle: Left ventricular ejection fraction, by estimation, is 55 to 60%. The left ventricle has normal function. The left ventricle has no regional wall motion abnormalities. Strain was performed and the global longitudinal strain is normal. The  left ventricular internal cavity size was normal in size. There is no left ventricular hypertrophy. Left ventricular diastolic parameters were normal. Right Ventricle:  The right ventricular size is normal. No increase in right ventricular wall thickness. Right ventricular systolic function is normal. There is normal pulmonary artery systolic pressure. The tricuspid regurgitant velocity is 2.52 m/s, and  with an assumed right atrial pressure of 3 mmHg, the estimated right ventricular systolic pressure is 28.4 mmHg. Left Atrium: Left atrial size was normal in size. Right Atrium: Right atrial size was normal in size. Pericardium: There is no evidence of pericardial effusion. Mitral Valve: The mitral valve is normal in structure. Mild mitral valve regurgitation. No evidence of mitral valve stenosis. MV peak gradient, 3.3 mmHg. The mean mitral valve gradient is 2.0 mmHg. Tricuspid Valve: The tricuspid valve is normal in structure. Tricuspid valve regurgitation is mild . No evidence of tricuspid stenosis. Aortic Valve: The aortic valve is normal in structure. Aortic valve regurgitation is not visualized. No aortic stenosis is present. Aortic valve mean gradient measures 4.0 mmHg. Aortic valve peak gradient measures 8.1 mmHg. Aortic valve area, by VTI measures 2.57 cm. Pulmonic Valve: The pulmonic valve was normal in structure.  Pulmonic valve regurgitation is not visualized. No evidence of pulmonic stenosis. Aorta: The aortic root is normal in size and structure. Venous: The inferior vena cava is normal in size with greater than 50% respiratory variability, suggesting right atrial pressure of 3 mmHg. IAS/Shunts: No atrial level shunt detected by color flow Doppler. Additional Comments: 3D imaging was not performed.  LEFT VENTRICLE PLAX 2D LVIDd:         5.20 cm      Diastology LVIDs:         3.80 cm      LV e' medial:    5.44 cm/s LV PW:         1.10 cm      LV E/e' medial:  14.8 LV IVS:        1.10 cm      LV e' lateral:   8.70 cm/s LVOT diam:     2.00 cm      LV E/e' lateral: 9.2 LV SV:         87 LV SV Index:   45 LVOT Area:     3.14 cm  LV Volumes (MOD) LV vol d, MOD A2C: 110.0 ml  LV vol d, MOD A4C: 121.0 ml LV vol s, MOD A2C: 54.2 ml LV vol s, MOD A4C: 46.1 ml LV SV MOD A2C:     55.8 ml LV SV MOD A4C:     121.0 ml LV SV MOD BP:      67.9 ml RIGHT VENTRICLE RV Basal diam:  3.85 cm RV Mid diam:    3.40 cm RV S prime:     18.30 cm/s TAPSE (M-mode): 3.5 cm LEFT ATRIUM             Index        RIGHT ATRIUM           Index LA diam:        4.40 cm 2.26 cm/m   RA Area:     20.60 cm LA Vol (A2C):   60.0 ml 30.79 ml/m  RA Volume:   60.70 ml  31.15 ml/m LA Vol (A4C):   88.7 ml 45.52 ml/m LA Biplane Vol: 73.4 ml 37.67 ml/m  AORTIC VALVE                    PULMONIC VALVE AV Area (Vmax):    2.43 cm     PV Vmax:       1.15 m/s AV Area (Vmean):   2.27 cm     PV Peak grad:  5.3 mmHg AV Area (VTI):     2.57 cm AV Vmax:           142.00 cm/s AV Vmean:          99.800 cm/s AV VTI:            0.339 m AV Peak Grad:      8.1 mmHg AV Mean Grad:      4.0 mmHg LVOT Vmax:         110.00 cm/s LVOT Vmean:        72.000 cm/s LVOT VTI:          0.277 m LVOT/AV VTI ratio: 0.82  AORTA Ao Root diam: 3.60 cm MITRAL VALVE               TRICUSPID VALVE MV Area (PHT): 3.51 cm    TR Peak grad:   25.4 mmHg MV Area VTI:   3.01 cm  TR Vmax:        252.00 cm/s MV Peak grad:  3.3 mmHg MV Mean grad:  2.0 mmHg    SHUNTS MV Vmax:       0.91 m/s    Systemic VTI:  0.28 m MV Vmean:      61.3 cm/s   Systemic Diam: 2.00 cm MV Decel Time: 216 msec MV E velocity: 80.30 cm/s MV A velocity: 74.80 cm/s MV E/A ratio:  1.07 Marcina Millard MD Electronically signed by Marcina Millard MD Signature Date/Time: 02/09/2024/1:29:32 PM    Final    DG Chest Port 1 View Result Date: 02/08/2024 CLINICAL DATA:  Chest pain EXAM: PORTABLE CHEST 1 VIEW COMPARISON:  None Available. FINDINGS: Normal mediastinum and cardiac silhouette. Normal pulmonary vasculature. No evidence of effusion, infiltrate, or pneumothorax. No acute bony abnormality. IMPRESSION: No acute cardiopulmonary process. Electronically Signed   By: Genevive Bi M.D.   On:  02/08/2024 18:19    Microbiology: No results found for this or any previous visit (from the past 240 hours).  Time spent: Greater than 30 minutes minutes  Signed: Lurene Shadow, MD 03/11/2024

## 2024-03-16 NOTE — Progress Notes (Signed)
 Chaplain provided staff supportive presence during Code. Dedra Skeens, cousin, [phone] called by doctor, no answer, message left. Chaplain offered a pause after code.      02/25/2024 0200  Spiritual Encounters  Type of Visit Initial  Care provided to: Patient  Referral source Code page  Reason for visit Code  OnCall Visit Yes

## 2024-03-16 DEATH — deceased

## 2024-04-05 ENCOUNTER — Ambulatory Visit: Payer: Medicare HMO | Admitting: Gastroenterology

## 2024-05-06 ENCOUNTER — Ambulatory Visit: Payer: Medicare HMO | Admitting: Cardiovascular Disease
